# Patient Record
Sex: Male | Born: 1975
Health system: Southern US, Community
[De-identification: ages and names within clinical notes are randomized; demographics above are authoritative.]

## PROBLEM LIST (undated history)

## (undated) DIAGNOSIS — E785 Hyperlipidemia, unspecified: Secondary | ICD-10-CM

## (undated) DIAGNOSIS — R339 Retention of urine, unspecified: Secondary | ICD-10-CM

## (undated) DIAGNOSIS — J189 Pneumonia, unspecified organism: Secondary | ICD-10-CM

## (undated) DIAGNOSIS — F419 Anxiety disorder, unspecified: Secondary | ICD-10-CM

## (undated) DIAGNOSIS — R079 Chest pain, unspecified: Secondary | ICD-10-CM

## (undated) DIAGNOSIS — I509 Heart failure, unspecified: Secondary | ICD-10-CM

## (undated) DIAGNOSIS — I209 Angina pectoris, unspecified: Secondary | ICD-10-CM

## (undated) DIAGNOSIS — F329 Major depressive disorder, single episode, unspecified: Secondary | ICD-10-CM

## (undated) DIAGNOSIS — R112 Nausea with vomiting, unspecified: Secondary | ICD-10-CM

## (undated) DIAGNOSIS — N529 Male erectile dysfunction, unspecified: Secondary | ICD-10-CM

## (undated) DIAGNOSIS — D649 Anemia, unspecified: Secondary | ICD-10-CM

## (undated) DIAGNOSIS — N049 Nephrotic syndrome with unspecified morphologic changes: Secondary | ICD-10-CM

## (undated) DIAGNOSIS — K219 Gastro-esophageal reflux disease without esophagitis: Secondary | ICD-10-CM

## (undated) DIAGNOSIS — E11621 Type 2 diabetes mellitus with foot ulcer: Secondary | ICD-10-CM

## (undated) DIAGNOSIS — I2699 Other pulmonary embolism without acute cor pulmonale: Secondary | ICD-10-CM

## (undated) DIAGNOSIS — N12 Tubulo-interstitial nephritis, not specified as acute or chronic: Secondary | ICD-10-CM

## (undated) DIAGNOSIS — Z9289 Personal history of other medical treatment: Secondary | ICD-10-CM

## (undated) DIAGNOSIS — R609 Edema, unspecified: Secondary | ICD-10-CM

## (undated) DIAGNOSIS — Z449 Encounter for fitting and adjustment of unspecified external prosthetic device: Secondary | ICD-10-CM

## (undated) DIAGNOSIS — N183 Chronic kidney disease, stage 3 (moderate): Secondary | ICD-10-CM

## (undated) DIAGNOSIS — E109 Type 1 diabetes mellitus without complications: Secondary | ICD-10-CM

## (undated) DIAGNOSIS — G4733 Obstructive sleep apnea (adult) (pediatric): Secondary | ICD-10-CM

## (undated) DIAGNOSIS — E669 Obesity, unspecified: Secondary | ICD-10-CM

## (undated) DIAGNOSIS — R600 Localized edema: Secondary | ICD-10-CM

## (undated) DIAGNOSIS — K635 Polyp of colon: Secondary | ICD-10-CM

## (undated) DIAGNOSIS — F32A Depression, unspecified: Secondary | ICD-10-CM

## (undated) DIAGNOSIS — I10 Essential (primary) hypertension: Secondary | ICD-10-CM

## (undated) DIAGNOSIS — I824Y9 Acute embolism and thrombosis of unspecified deep veins of unspecified proximal lower extremity: Secondary | ICD-10-CM

## (undated) DIAGNOSIS — L97519 Non-pressure chronic ulcer of other part of right foot with unspecified severity: Secondary | ICD-10-CM

## (undated) DIAGNOSIS — I82409 Acute embolism and thrombosis of unspecified deep veins of unspecified lower extremity: Secondary | ICD-10-CM

## (undated) HISTORY — DX: Anemia, unspecified: D64.9

## (undated) HISTORY — DX: Obstructive sleep apnea (adult) (pediatric): G47.33

## (undated) HISTORY — DX: Acute embolism and thrombosis of unspecified deep veins of unspecified proximal lower extremity: I82.4Y9

## (undated) HISTORY — PX: INCISION AND DRAINAGE ABSCESS: SHX5864

## (undated) HISTORY — DX: Polyp of colon: K63.5

---
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
## 1999-01-05 ENCOUNTER — Encounter: Payer: Self-pay | Admitting: Emergency Medicine

## 1999-01-05 ENCOUNTER — Emergency Department (HOSPITAL_COMMUNITY): Admission: EM | Admit: 1999-01-05 | Discharge: 1999-01-05 | Payer: Self-pay | Admitting: Emergency Medicine

## 1999-01-15 ENCOUNTER — Emergency Department (HOSPITAL_COMMUNITY): Admission: EM | Admit: 1999-01-15 | Discharge: 1999-01-15 | Payer: Self-pay | Admitting: Emergency Medicine

## 1999-12-20 ENCOUNTER — Inpatient Hospital Stay (HOSPITAL_COMMUNITY): Admission: EM | Admit: 1999-12-20 | Discharge: 1999-12-27 | Payer: Self-pay | Admitting: Emergency Medicine

## 2002-01-09 DIAGNOSIS — Z794 Long term (current) use of insulin: Secondary | ICD-10-CM

## 2002-01-09 DIAGNOSIS — E119 Type 2 diabetes mellitus without complications: Secondary | ICD-10-CM | POA: Insufficient documentation

## 2002-09-10 DIAGNOSIS — I82409 Acute embolism and thrombosis of unspecified deep veins of unspecified lower extremity: Secondary | ICD-10-CM

## 2002-09-10 DIAGNOSIS — I2699 Other pulmonary embolism without acute cor pulmonale: Secondary | ICD-10-CM

## 2002-09-10 HISTORY — DX: Acute embolism and thrombosis of unspecified deep veins of unspecified lower extremity: I82.409

## 2002-09-10 HISTORY — DX: Other pulmonary embolism without acute cor pulmonale: I26.99

## 2002-09-23 ENCOUNTER — Encounter: Payer: Self-pay | Admitting: Internal Medicine

## 2002-09-23 ENCOUNTER — Inpatient Hospital Stay (HOSPITAL_COMMUNITY): Admission: EM | Admit: 2002-09-23 | Discharge: 2002-09-27 | Payer: Self-pay

## 2002-09-24 ENCOUNTER — Encounter: Payer: Self-pay | Admitting: Internal Medicine

## 2002-10-01 ENCOUNTER — Encounter: Payer: Self-pay | Admitting: Emergency Medicine

## 2002-10-01 ENCOUNTER — Inpatient Hospital Stay (HOSPITAL_COMMUNITY): Admission: EM | Admit: 2002-10-01 | Discharge: 2002-10-07 | Payer: Self-pay | Admitting: Emergency Medicine

## 2002-10-02 ENCOUNTER — Encounter: Payer: Self-pay | Admitting: Cardiology

## 2002-10-02 ENCOUNTER — Encounter: Payer: Self-pay | Admitting: Internal Medicine

## 2002-10-04 ENCOUNTER — Encounter: Payer: Self-pay | Admitting: Internal Medicine

## 2002-10-09 ENCOUNTER — Encounter: Admission: RE | Admit: 2002-10-09 | Discharge: 2002-10-09 | Payer: Self-pay | Admitting: Internal Medicine

## 2002-10-13 ENCOUNTER — Encounter: Admission: RE | Admit: 2002-10-13 | Discharge: 2002-10-13 | Payer: Self-pay | Admitting: Internal Medicine

## 2002-10-16 ENCOUNTER — Encounter: Admission: RE | Admit: 2002-10-16 | Discharge: 2002-10-16 | Payer: Self-pay | Admitting: Internal Medicine

## 2002-10-20 ENCOUNTER — Encounter: Admission: RE | Admit: 2002-10-20 | Discharge: 2002-10-20 | Payer: Self-pay | Admitting: Internal Medicine

## 2002-10-27 ENCOUNTER — Encounter: Admission: RE | Admit: 2002-10-27 | Discharge: 2002-10-27 | Payer: Self-pay | Admitting: Internal Medicine

## 2002-11-06 ENCOUNTER — Encounter: Admission: RE | Admit: 2002-11-06 | Discharge: 2002-11-06 | Payer: Self-pay | Admitting: Internal Medicine

## 2002-11-17 ENCOUNTER — Encounter: Admission: RE | Admit: 2002-11-17 | Discharge: 2002-11-17 | Payer: Self-pay | Admitting: Internal Medicine

## 2002-11-27 ENCOUNTER — Encounter: Admission: RE | Admit: 2002-11-27 | Discharge: 2002-11-27 | Payer: Self-pay | Admitting: Internal Medicine

## 2002-11-27 ENCOUNTER — Encounter (INDEPENDENT_AMBULATORY_CARE_PROVIDER_SITE_OTHER): Payer: Self-pay | Admitting: *Deleted

## 2002-12-08 ENCOUNTER — Encounter: Admission: RE | Admit: 2002-12-08 | Discharge: 2002-12-08 | Payer: Self-pay | Admitting: Internal Medicine

## 2003-01-26 ENCOUNTER — Encounter: Admission: RE | Admit: 2003-01-26 | Discharge: 2003-01-26 | Payer: Self-pay | Admitting: Internal Medicine

## 2003-02-16 ENCOUNTER — Encounter: Admission: RE | Admit: 2003-02-16 | Discharge: 2003-02-16 | Payer: Self-pay | Admitting: Internal Medicine

## 2003-03-16 ENCOUNTER — Encounter: Admission: RE | Admit: 2003-03-16 | Discharge: 2003-03-16 | Payer: Self-pay | Admitting: Internal Medicine

## 2003-04-02 ENCOUNTER — Encounter: Admission: RE | Admit: 2003-04-02 | Discharge: 2003-04-02 | Payer: Self-pay | Admitting: Internal Medicine

## 2003-05-27 ENCOUNTER — Emergency Department (HOSPITAL_COMMUNITY): Admission: EM | Admit: 2003-05-27 | Discharge: 2003-05-27 | Payer: Self-pay | Admitting: Emergency Medicine

## 2003-05-31 ENCOUNTER — Emergency Department (HOSPITAL_COMMUNITY): Admission: EM | Admit: 2003-05-31 | Discharge: 2003-05-31 | Payer: Self-pay | Admitting: Emergency Medicine

## 2003-06-03 ENCOUNTER — Encounter: Admission: RE | Admit: 2003-06-03 | Discharge: 2003-06-03 | Payer: Self-pay | Admitting: Internal Medicine

## 2003-06-03 ENCOUNTER — Ambulatory Visit (HOSPITAL_COMMUNITY): Admission: RE | Admit: 2003-06-03 | Discharge: 2003-06-03 | Payer: Self-pay | Admitting: Internal Medicine

## 2003-06-27 ENCOUNTER — Encounter: Admission: RE | Admit: 2003-06-27 | Discharge: 2003-06-27 | Payer: Self-pay | Admitting: Family Medicine

## 2003-09-28 ENCOUNTER — Ambulatory Visit: Payer: Self-pay | Admitting: Internal Medicine

## 2003-09-28 ENCOUNTER — Emergency Department (HOSPITAL_COMMUNITY): Admission: EM | Admit: 2003-09-28 | Discharge: 2003-09-28 | Payer: Self-pay | Admitting: Family Medicine

## 2003-10-09 ENCOUNTER — Inpatient Hospital Stay (HOSPITAL_COMMUNITY): Admission: EM | Admit: 2003-10-09 | Discharge: 2003-10-13 | Payer: Self-pay | Admitting: Emergency Medicine

## 2003-10-09 ENCOUNTER — Ambulatory Visit: Payer: Self-pay | Admitting: Internal Medicine

## 2004-02-28 ENCOUNTER — Inpatient Hospital Stay (HOSPITAL_COMMUNITY): Admission: EM | Admit: 2004-02-28 | Discharge: 2004-02-29 | Payer: Self-pay | Admitting: Emergency Medicine

## 2004-02-28 ENCOUNTER — Ambulatory Visit: Payer: Self-pay | Admitting: Internal Medicine

## 2004-06-16 ENCOUNTER — Inpatient Hospital Stay (HOSPITAL_COMMUNITY): Admission: AD | Admit: 2004-06-16 | Discharge: 2004-06-20 | Payer: Self-pay | Admitting: Emergency Medicine

## 2004-06-16 ENCOUNTER — Ambulatory Visit: Payer: Self-pay | Admitting: Internal Medicine

## 2004-07-07 ENCOUNTER — Ambulatory Visit (HOSPITAL_COMMUNITY): Admission: RE | Admit: 2004-07-07 | Discharge: 2004-07-07 | Payer: Self-pay | Admitting: Internal Medicine

## 2004-07-07 ENCOUNTER — Ambulatory Visit: Payer: Self-pay | Admitting: Internal Medicine

## 2004-07-08 ENCOUNTER — Ambulatory Visit: Payer: Self-pay | Admitting: Internal Medicine

## 2004-07-18 ENCOUNTER — Ambulatory Visit: Payer: Self-pay | Admitting: Internal Medicine

## 2004-08-01 ENCOUNTER — Ambulatory Visit: Payer: Self-pay | Admitting: Internal Medicine

## 2004-08-08 ENCOUNTER — Ambulatory Visit: Payer: Self-pay | Admitting: Internal Medicine

## 2004-12-29 ENCOUNTER — Ambulatory Visit: Payer: Self-pay | Admitting: Internal Medicine

## 2004-12-30 ENCOUNTER — Ambulatory Visit: Payer: Self-pay | Admitting: Internal Medicine

## 2005-03-16 ENCOUNTER — Emergency Department (HOSPITAL_COMMUNITY): Admission: EM | Admit: 2005-03-16 | Discharge: 2005-03-17 | Payer: Self-pay | Admitting: Emergency Medicine

## 2005-09-04 ENCOUNTER — Encounter (INDEPENDENT_AMBULATORY_CARE_PROVIDER_SITE_OTHER): Payer: Self-pay | Admitting: *Deleted

## 2005-09-04 ENCOUNTER — Ambulatory Visit: Payer: Self-pay | Admitting: Hospitalist

## 2005-09-19 ENCOUNTER — Inpatient Hospital Stay (HOSPITAL_COMMUNITY): Admission: EM | Admit: 2005-09-19 | Discharge: 2005-09-21 | Payer: Self-pay | Admitting: Emergency Medicine

## 2005-09-19 ENCOUNTER — Ambulatory Visit: Payer: Self-pay | Admitting: Internal Medicine

## 2005-10-26 ENCOUNTER — Encounter (INDEPENDENT_AMBULATORY_CARE_PROVIDER_SITE_OTHER): Payer: Self-pay | Admitting: *Deleted

## 2005-10-26 DIAGNOSIS — R110 Nausea: Secondary | ICD-10-CM | POA: Insufficient documentation

## 2006-03-21 ENCOUNTER — Ambulatory Visit: Payer: Self-pay | Admitting: *Deleted

## 2006-03-21 ENCOUNTER — Encounter: Payer: Self-pay | Admitting: *Deleted

## 2006-03-21 ENCOUNTER — Encounter (INDEPENDENT_AMBULATORY_CARE_PROVIDER_SITE_OTHER): Payer: Self-pay | Admitting: *Deleted

## 2006-03-21 ENCOUNTER — Ambulatory Visit (HOSPITAL_COMMUNITY): Admission: RE | Admit: 2006-03-21 | Discharge: 2006-03-21 | Payer: Self-pay | Admitting: *Deleted

## 2006-03-25 ENCOUNTER — Emergency Department (HOSPITAL_COMMUNITY): Admission: EM | Admit: 2006-03-25 | Discharge: 2006-03-25 | Payer: Self-pay | Admitting: Emergency Medicine

## 2006-03-26 ENCOUNTER — Telehealth: Payer: Self-pay | Admitting: *Deleted

## 2006-04-03 ENCOUNTER — Telehealth: Payer: Self-pay | Admitting: *Deleted

## 2006-12-03 ENCOUNTER — Telehealth: Payer: Self-pay | Admitting: *Deleted

## 2007-01-29 ENCOUNTER — Ambulatory Visit: Payer: Self-pay | Admitting: Internal Medicine

## 2007-01-29 ENCOUNTER — Encounter (INDEPENDENT_AMBULATORY_CARE_PROVIDER_SITE_OTHER): Payer: Self-pay | Admitting: *Deleted

## 2007-02-01 DIAGNOSIS — R809 Proteinuria, unspecified: Secondary | ICD-10-CM | POA: Insufficient documentation

## 2007-02-01 LAB — CONVERTED CEMR LAB
Creatinine, Ser: 0.95 mg/dL (ref 0.40–1.50)
Glucose, Bld: 448 mg/dL — ABNORMAL HIGH (ref 70–99)
Microalb Creat Ratio: 49.5 mg/g — ABNORMAL HIGH (ref 0.0–30.0)
Microalb, Ur: 2.16 mg/dL — ABNORMAL HIGH (ref 0.00–1.89)
Potassium: 4.2 meq/L (ref 3.5–5.3)
Sodium: 130 meq/L — ABNORMAL LOW (ref 135–145)

## 2007-03-07 ENCOUNTER — Telehealth: Payer: Self-pay | Admitting: *Deleted

## 2007-05-08 ENCOUNTER — Ambulatory Visit: Payer: Self-pay | Admitting: Internal Medicine

## 2007-05-08 LAB — CONVERTED CEMR LAB
Blood Glucose, Fingerstick: 249
Hgb A1c MFr Bld: 10.1 %

## 2007-06-21 ENCOUNTER — Ambulatory Visit: Payer: Self-pay | Admitting: Internal Medicine

## 2007-06-21 DIAGNOSIS — B86 Scabies: Secondary | ICD-10-CM | POA: Insufficient documentation

## 2007-08-05 ENCOUNTER — Emergency Department (HOSPITAL_COMMUNITY): Admission: EM | Admit: 2007-08-05 | Discharge: 2007-08-06 | Payer: Self-pay | Admitting: Emergency Medicine

## 2008-01-07 ENCOUNTER — Telehealth: Payer: Self-pay | Admitting: Infectious Diseases

## 2008-01-08 ENCOUNTER — Telehealth: Payer: Self-pay | Admitting: *Deleted

## 2008-12-28 ENCOUNTER — Ambulatory Visit: Payer: Self-pay | Admitting: Internal Medicine

## 2008-12-28 LAB — CONVERTED CEMR LAB: Blood Glucose, Fingerstick: 180

## 2008-12-29 LAB — CONVERTED CEMR LAB
Alkaline Phosphatase: 67 units/L (ref 39–117)
BUN: 14 mg/dL (ref 6–23)
Calcium: 10.1 mg/dL (ref 8.4–10.5)
Chloride: 103 meq/L (ref 96–112)
Cholesterol: 154 mg/dL (ref 0–200)
Creatinine, Ser: 0.89 mg/dL (ref 0.40–1.50)
HDL: 43 mg/dL (ref >39)
Hemoglobin: 16.3 g/dL (ref 13.0–17.0)
MCHC: 35.7 g/dL (ref 30.0–36.0)
Microalb Creat Ratio: 80.5 mg/g — ABNORMAL HIGH (ref 0.0–30.0)
Platelets: 289 10*3/uL (ref 150–400)
Potassium: 4.4 meq/L (ref 3.5–5.3)
RBC: 5.54 M/uL (ref 4.22–5.81)
RDW: 12.6 % (ref 11.5–15.5)
Sodium: 140 meq/L (ref 135–145)
TSH: 1.964 microintl units/mL (ref 0.350–4.5)
Total Bilirubin: 1 mg/dL (ref 0.3–1.2)

## 2009-01-14 ENCOUNTER — Ambulatory Visit: Payer: Self-pay | Admitting: Internal Medicine

## 2009-01-14 DIAGNOSIS — R609 Edema, unspecified: Secondary | ICD-10-CM | POA: Insufficient documentation

## 2009-01-14 LAB — CONVERTED CEMR LAB
ALT: 18 units/L (ref 0–53)
AST: 20 units/L (ref 0–37)
Bilirubin Urine: NEGATIVE
Bilirubin Urine: NEGATIVE
Blood Glucose, Fingerstick: 119
Blood Glucose, Home Monitor: 4 mg/dL
Calcium: 9.1 mg/dL (ref 8.4–10.5)
Glucose, Bld: 76 mg/dL (ref 70–99)
Glucose, Urine, Semiquant: NEGATIVE
Hemoglobin, Urine: NEGATIVE
Ketones, ur: NEGATIVE mg/dL
Leukocytes, UA: NEGATIVE
Nitrite: NEGATIVE
Protein, U semiquant: NEGATIVE
Sodium: 137 meq/L (ref 135–145)
Specific Gravity, Urine: 1.014 (ref 1.005–1.0)
Total Protein: 6.8 g/dL (ref 6.0–8.3)
Urine Glucose: NEGATIVE mg/dL
Urobilinogen, UA: 0.2
WBC Urine, dipstick: NEGATIVE
pH: 5

## 2009-01-14 LAB — HM DIABETES FOOT EXAM

## 2009-04-05 ENCOUNTER — Emergency Department (HOSPITAL_COMMUNITY): Admission: EM | Admit: 2009-04-05 | Discharge: 2009-04-05 | Payer: Self-pay | Admitting: Emergency Medicine

## 2009-04-22 ENCOUNTER — Telehealth: Payer: Self-pay | Admitting: *Deleted

## 2009-05-18 ENCOUNTER — Telehealth: Payer: Self-pay | Admitting: *Deleted

## 2009-05-18 ENCOUNTER — Emergency Department (HOSPITAL_COMMUNITY): Admission: EM | Admit: 2009-05-18 | Discharge: 2009-05-18 | Payer: Self-pay | Admitting: Family Medicine

## 2009-05-19 ENCOUNTER — Telehealth: Payer: Self-pay | Admitting: *Deleted

## 2009-05-21 ENCOUNTER — Ambulatory Visit: Payer: Self-pay | Admitting: Internal Medicine

## 2009-05-21 DIAGNOSIS — L03221 Cellulitis of neck: Secondary | ICD-10-CM

## 2009-05-21 DIAGNOSIS — L0211 Cutaneous abscess of neck: Secondary | ICD-10-CM | POA: Insufficient documentation

## 2009-05-24 ENCOUNTER — Ambulatory Visit: Payer: Self-pay | Admitting: Infectious Disease

## 2009-07-19 ENCOUNTER — Telehealth: Payer: Self-pay | Admitting: Internal Medicine

## 2009-07-21 ENCOUNTER — Ambulatory Visit: Payer: Self-pay | Admitting: Internal Medicine

## 2009-07-21 ENCOUNTER — Encounter: Payer: Self-pay | Admitting: Internal Medicine

## 2009-07-21 ENCOUNTER — Inpatient Hospital Stay (HOSPITAL_COMMUNITY): Admission: AD | Admit: 2009-07-21 | Discharge: 2009-07-22 | Payer: Self-pay | Admitting: Internal Medicine

## 2009-07-21 DIAGNOSIS — E1010 Type 1 diabetes mellitus with ketoacidosis without coma: Secondary | ICD-10-CM | POA: Insufficient documentation

## 2009-07-21 DIAGNOSIS — R109 Unspecified abdominal pain: Secondary | ICD-10-CM | POA: Insufficient documentation

## 2009-07-21 LAB — CONVERTED CEMR LAB
ALT: 16 units/L (ref 0–53)
AST: 20 units/L (ref 0–37)
Alkaline Phosphatase: 82 units/L (ref 39–117)
Basophils Absolute: 0 10*3/uL (ref 0.0–0.1)
Basophils Relative: 0 % (ref 0–1)
CO2: 21 meq/L (ref 19–32)
Glucose, Bld: 472 mg/dL — ABNORMAL HIGH (ref 70–99)
HCT: 46 % (ref 39.0–52.0)
Hemoglobin, Urine: NEGATIVE
Lipase: 57 units/L (ref 11–59)
Lymphocytes Relative: 20 % (ref 12–46)
Monocytes Absolute: 0.6 10*3/uL (ref 0.1–1.0)
Neutrophils Relative %: 70 % (ref 43–77)
Potassium: 4.8 meq/L (ref 3.5–5.3)
RBC: 5.23 M/uL (ref 4.22–5.81)
Total Bilirubin: 1.8 mg/dL — ABNORMAL HIGH (ref 0.3–1.2)
Total Protein: 7.8 g/dL (ref 6.0–8.3)
WBC: 7.5 10*3/uL (ref 4.0–10.5)
pH: 5.5 (ref 5.0–8.0)

## 2009-07-22 ENCOUNTER — Encounter: Payer: Self-pay | Admitting: Internal Medicine

## 2009-07-28 ENCOUNTER — Encounter: Payer: Self-pay | Admitting: Internal Medicine

## 2009-07-28 ENCOUNTER — Ambulatory Visit: Payer: Self-pay | Admitting: Vascular Surgery

## 2009-07-28 ENCOUNTER — Ambulatory Visit: Payer: Self-pay | Admitting: Internal Medicine

## 2009-07-28 ENCOUNTER — Ambulatory Visit: Admission: RE | Admit: 2009-07-28 | Discharge: 2009-07-28 | Payer: Self-pay | Admitting: Internal Medicine

## 2009-07-28 ENCOUNTER — Inpatient Hospital Stay (HOSPITAL_COMMUNITY): Admission: EM | Admit: 2009-07-28 | Discharge: 2009-07-29 | Payer: Self-pay | Admitting: Internal Medicine

## 2009-07-28 DIAGNOSIS — Z86718 Personal history of other venous thrombosis and embolism: Secondary | ICD-10-CM | POA: Insufficient documentation

## 2009-07-29 ENCOUNTER — Encounter: Payer: Self-pay | Admitting: Internal Medicine

## 2009-08-02 ENCOUNTER — Ambulatory Visit: Payer: Self-pay | Admitting: Internal Medicine

## 2009-08-02 DIAGNOSIS — E876 Hypokalemia: Secondary | ICD-10-CM | POA: Insufficient documentation

## 2009-08-02 DIAGNOSIS — Z86718 Personal history of other venous thrombosis and embolism: Secondary | ICD-10-CM | POA: Insufficient documentation

## 2009-08-02 LAB — CONVERTED CEMR LAB
BUN: 8 mg/dL (ref 6–23)
Creatinine, Ser: 0.83 mg/dL (ref 0.40–1.50)
Glucose, Bld: 303 mg/dL — ABNORMAL HIGH (ref 70–99)
INR: 1.4
Potassium: 4.2 meq/L (ref 3.5–5.3)

## 2009-08-09 ENCOUNTER — Ambulatory Visit: Payer: Self-pay | Admitting: Internal Medicine

## 2009-08-09 LAB — CONVERTED CEMR LAB

## 2009-08-31 ENCOUNTER — Telehealth: Payer: Self-pay | Admitting: Internal Medicine

## 2009-09-01 ENCOUNTER — Ambulatory Visit: Payer: Self-pay | Admitting: Internal Medicine

## 2009-09-01 LAB — CONVERTED CEMR LAB: INR: 1.1

## 2010-01-25 DIAGNOSIS — Z7901 Long term (current) use of anticoagulants: Secondary | ICD-10-CM

## 2010-01-25 DIAGNOSIS — I82409 Acute embolism and thrombosis of unspecified deep veins of unspecified lower extremity: Secondary | ICD-10-CM

## 2010-01-25 DIAGNOSIS — I2699 Other pulmonary embolism without acute cor pulmonale: Secondary | ICD-10-CM

## 2010-02-06 LAB — CONVERTED CEMR LAB
Blood Glucose, Fingerstick: 116
CO2: 27 meq/L (ref 19–32)
Chloride: 103 meq/L (ref 96–112)
Creatinine, Ser: 0.81 mg/dL (ref 0.40–1.50)
Sodium: 141 meq/L (ref 135–145)

## 2010-02-08 NOTE — Assessment & Plan Note (Signed)
Summary: coumadin check/pcp-joines/hla  Anticoagulant Therapy Managed by: [name]. Ceasar Lund  PharmD CACP PCP: Bertha Stakes MD [facility] Attending: Lynnae January MD, Benjamine Mola Indication 1: Deep vein thrombus Indication 2: Encounter for therapeutic drug monitoring  V58.83 Start date: 07/28/2009  Patient Assessment Reviewed by: Paulla Dolly PharmD  September 01, 2009 Medication review: verified warfarin dosage & schedule,verified previous prescription medications, verified doses & any changes, verified new medications, reviewed OTC medications, reviewed OTC health products-vitamins supplements etc Complications: none Dietary changes: none   Health status changes: none   Lifestyle changes: none   Recent/future hospitalizations: none   Recent/future procedures: none   Recent/future dental: none Patient Assessment Part 2:  Have you MISSED ANY DOSES or CHANGED TABLETS?  YES. States he missed the past 3 days/doses of warfarin (ran out he states). Was supposed to come to clinic on 10-Aug-11 but did not keep appointment.  Have you had any BRUISING or BLEEDING ( nose or gum bleeds,blood in urine or stool)?  No reported bruising or bleeding in nose, gums, urine, stool.  Have you STARTED or STOPPED any MEDICATIONS, including OTC meds,herbals or supplements?  No other medications or herbal supplements were started or stopped.  Have you CHANGED your DIET, especially green vegetables,or ALCOHOL intake?  No changes in diet or alcohol intake.  Have you had any ILLNESSES or HOSPITALIZATIONS?  No reported illnesses or hospitalizations  Have you had any signs of CLOTTING?(chest discomfort,dizziness,shortness of breath,arms tingling,slurred speech,swelling or redness in leg)    No chest discomfort, dizziness, shortness of breath, tingling in arm, slurred speech, swelling, or redness in leg.     Treatment  Target INR: 2.0-3.0 INR: 1.1  Date: 09/01/2009 Regimen In:  27.5mg /week INR reflects regimen in:  1.1  New  Tablet strength: : 5mg  Regimen Out:     Sunday: 1 Tablet     Monday: 1 Tablet     Tuesday: 1 Tablet     Wednesday: 1/2 Tablet     Thursday: 1 Tablet      Friday: 1 Tablet     Saturday: 1 Tablet Total Weekly: 32.5mg/week mg  Next INR Due: 09/20/2009 Adjusted by: James B. Groce III PharmD CACP   Return to anticoagulation clinic:  09/20/2009 Time of next visit: 1515   Comments: Non-compliant to last scheduled clinic appointment.   Allergies: No Known Drug Allergies Prescriptions: WARFARIN SODIUM 5 MG TABS (WARFARIN SODIUM) Take as directed.  #31 x 2   Entered by:   Jay Groce PharmD   Authorized by:   [name] Butcher MD   Signed by:   Jay Groce PharmD on 09/01/2009   Method used:   Electronically to        [id number] (retail)       [address], [place]  [address]       Ph: [phone]       Fax: [phone]   RxID:   [id number]

## 2010-02-08 NOTE — Assessment & Plan Note (Signed)
Summary: HFU-PER DR WATSON/CFB(JOINES)   Vital Signs:  Patient profile:   35 year old male Height:      71 inches (180.34 cm) Weight:      323.9 pounds (147.23 kg) BMI:     45.34 Temp:     98.2 degrees F Pulse rate:   85 / minute BP sitting:   138 / 87  (left arm) Cuff size:   large  Vitals Entered By: Yvonna Alanis RN (August 02, 2009 9:38 AM)  CC: HFU - low potassium and INR control - stat BMET and PT/INR drawn - pt needs f/u appt with Barnabas Harries, Marlana Latus - states bilateral legs less swollen than when admitted to hospital - has some residual leg soreness left leg Is Patient Diabetic? Yes Did you bring your meter with you today? No Pain Assessment Patient in pain? yes     Location: left leg - esp left ankle Intensity: 1 Type: aching Onset of pain  last week Nutritional Status BMI of > 30 = obese  Does patient need assistance? Functional Status Self care Ambulation Normal Comments states wants diuretic to improve urination and decrease swelling in legs   Primary Care Provider:  Bertha Stakes MD  CC:  HFU - low potassium and INR control - stat BMET and PT/INR drawn - pt needs f/u appt with Barnabas Harries and Marlana Latus - states bilateral legs less swollen than when admitted to hospital - has some residual leg soreness left leg.  History of Present Illness: 35 y/o male with pmh as described in the EMR; who comes tot he clinic for HFU, after been admitted secondary to Left LE DVT. Patient is currently using lovenox and coumadin; he is not having any leg pain, palpitations, SOB or chest pain.  INR today was 1.4; patient will follow with Dr. Elie Confer Monday next week.  Preventive Screening-Counseling & Management  Alcohol-Tobacco     Alcohol type: AT TIMES     Smoking Status: never  Caffeine-Diet-Exercise     Does Patient Exercise: yes     Type of exercise: Treadmill, bike, free weights.     Times/week: 4-5  Problems Prior to Update: 1)  Deep Venous Thrombophlebitis, Hx of   (ICD-V12.52) 2)  Special Scr Examination Unspec Viral Disease  (ICD-V73.99) 3)  Diabetes W/ketoacidosis Type I [juv] Uncntrl  (ICD-250.13) 4)  Abdominal Pain  (ICD-789.00) 5)  Abscess, Neck  (ICD-682.1) 6)  Edema  (ICD-782.3) 7)  Scabies  (ICD-133.0) 8)  Proteinuria, Mild  (ICD-791.0) 9)  Sexual Activity, High Risk  (ICD-V69.2) 10)  Morbid Obesity  (ICD-278.01) 11)  Nausea  (ICD-787.02) 12)  Diabetes Mellitus, Type I  (ICD-250.01)  Current Problems (verified): 1)  Hypokalemia  (ICD-276.8) 2)  Deep Venous Thrombophlebitis, Hx of  (ICD-V12.52) 3)  Special Scr Examination Unspec Viral Disease  (ICD-V73.99) 4)  Diabetes W/ketoacidosis Type I [juv] Uncntrl  (ICD-250.13) 5)  Abdominal Pain  (ICD-789.00) 6)  Abscess, Neck  (ICD-682.1) 7)  Edema  (ICD-782.3) 8)  Scabies  (ICD-133.0) 9)  Proteinuria, Mild  (ICD-791.0) 10)  Sexual Activity, High Risk  (ICD-V69.2) 11)  Morbid Obesity  (ICD-278.01) 12)  Nausea  (ICD-787.02) 13)  Diabetes Mellitus, Type I  (ICD-250.01)  Medications Prior to Update: 1)  Novolin 70/30 70-30 %  Susp (Insulin Isophane & Regular) .... Inject 45 Units Subcutaneously Each Am and 40 Units Subcutaneously Each Pm. 2)  Glucophage 500 Mg Tabs (Metformin Hcl) .... Take 2 Tablets By Mouth Two Times A Day 3)  Truetrack Test  Strp (Glucose Blood) .... Use To Test Blood Sugar 4-6 Times A Day- Suggest At Least Before Each Meal and Bedtime 4)  Lancets 30g  Misc (Lancets) .... Use To Check Blood Sugar 4-6 Times Daily 5)  Enoxaparin Sodium 150 Mg/ml Soln (Enoxaparin Sodium) .... Inject 150mg  Subcutaneously Two Times A Day For 5 Days. 6)  Warfarin Sodium 7.5 Mg Tabs (Warfarin Sodium) .... Take 1 Tablet By Mouth Once A Day  Current Medications (verified): 1)  Novolin 70/30 70-30 %  Susp (Insulin Isophane & Regular) .... Inject 45 Units Subcutaneously Each Am and 40 Units Subcutaneously Each Pm. 2)  Glucophage 500 Mg Tabs (Metformin Hcl) .... Take 2 Tablets By Mouth Two Times A  Day 3)  Truetrack Test  Strp (Glucose Blood) .... Use To Test Blood Sugar 4-6 Times A Day- Suggest At Least Before Each Meal and Bedtime 4)  Lancets 30g  Misc (Lancets) .... Use To Check Blood Sugar 4-6 Times Daily 5)  Enoxaparin Sodium 150 Mg/ml Soln (Enoxaparin Sodium) .... Inject 150mg  Subcutaneously Two Times A Day For 5 Days. 6)  Warfarin Sodium 7.5 Mg Tabs (Warfarin Sodium) .... Take 1 Tablet By Mouth Once A Day  Allergies (verified): No Known Drug Allergies  Past History:  Family History: Last updated: 05/08/2007 AODM in mother CHF aunt HTN several relatives No cancer  Social History: Last updated: 07/21/2009 Finishing school at [employer] this month, plans to transfer to 4 year school. 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  Risk Factors: Exercise: yes (08/02/2009)  Risk Factors: Smoking Status: never (08/02/2009)  Past Medical History: Diabetes mellitus, type I DVT, hx of  Review of Systems  The patient denies anorexia, fever, chest pain, syncope, dyspnea on exertion, abdominal pain, melena, hematochezia, severe indigestion/heartburn, and muscle weakness.    Physical Exam  General:  alert, well-developed, well-hydrated, and overweight-appearing.   Lungs:  normal respiratory effort, normal breath sounds, no crackles, and no wheezes.   Heart:  normal rate, regular rhythm, no murmur, no gallop, and no rub.   Abdomen:  soft, non-tender, normal bowel sounds, no guarding, no rigidity, no hepatomegaly, and no splenomegaly.   Extremities:  Trace edema on right leg; 1+ edema on his left leg; wearing a stocking sock to help with swelling. Neurologic:  alert & oriented X3, cranial nerves II-XII intact, and gait normal.     Impression & Recommendations:  Problem # 1:  DVT, HX OF (ICD-V12.51)  Patient with recent DVT in his left  lower extremety; currently using lovenox and coumadin for bridging. INR 1.4; will continue coadjuvant  anticogulation therapy and will bring him on Friday for further INR analysis. Patient has had 2 previous unprovoked episodes ofDVT which means he will need coumadin therapy for the rest of his life.  His updated medication list for this problem includes:    Enoxaparin Sodium 150 Mg/ml Soln (Enoxaparin sodium) ..... Inject 150mg  subcutaneously two times a day for 5 days.    Warfarin Sodium 7.5 Mg Tabs (Warfarin sodium) .Marland Kitchen... Take 1 tablet by mouth once a day  Future Orders: T-Protime (in-house) [id number]) ... 08/06/2009  Problem # 2:  HYPOKALEMIA (ICD-276.8) BMET done and his electrolytes and renal function are WNL.  Orders: T-Basic Metabolic Panel ([id number])  Problem # 3:  DIABETES MELLITUS, TYPE I (ICD-250.01) CBG 300 and his A1C 11.3; he has not been compliant with his insulin for the last couple of months; but is now back on track and  planning to be compliant with his meds; will repeat A1C in 3 months and at that time adjust his medications as needed. Patient advised tofollow a low carn diet and tocontinue exercising and losing weight.  His updated medication list for this problem includes:    Novolin 70/30 70-30 % Susp (Insulin isophane & regular) ..... Inject 45 units subcutaneously each am and 40 units subcutaneously each pm.    Glucophage 500 Mg Tabs (Metformin hcl) .Marland Kitchen... Take 2 tablets by mouth two times a day  Labs Reviewed: Creat: 0.81 (07/28/2009)    Reviewed HgBA1c results: 12.2 (07/21/2009)  11.3 (05/21/2009)  Complete Medication List: 1)  Novolin 70/30 70-30 % Susp (Insulin isophane & regular) .... Inject 45 units subcutaneously each am and 40 units subcutaneously each pm. 2)  Glucophage 500 Mg Tabs (Metformin hcl) .... Take 2 tablets by mouth two times a day 3)  Truetrack Test Strp (Glucose blood) .... Use to test blood sugar 4-6 times a day- suggest at least before each meal and bedtime 4)  Lancets 30g Misc (Lancets) .... Use to check blood sugar 4-6 times daily 5)   Enoxaparin Sodium 150 Mg/ml Soln (Enoxaparin sodium) .... Inject 150mg  subcutaneously two times a day for 5 days. 6)  Warfarin Sodium 7.5 Mg Tabs (Warfarin sodium) .... Take 1 tablet by mouth once a day  Patient Instructions: 1)  Follow with your coumadin clinic appointments and be compliant with your coumadin. 2)  Return on Friday morning to repeat coumadin levels and adjust your anticoagulation therapy. 3)  You will be called with any abnormalities in the tests scheduled or performed today.  If you don't hear from [place] within a week from when the test was performed, you can assume that your test was normal. 4)  Please schedule a follow-up appointment in 3 months with PCP for DM and chronic problems followup. 5)  Make sure you follow with Bonna Gains and also with Barnabas Harries. Process Orders Check Orders Results:     [facility]: ABN not required for this insurance Tests Sent for requisitioning (August 03, 2009 5:25 PM):     08/02/2009: [facility] -- T-Basic Metabolic Panel [id number] (signed)    Prevention & Chronic Care Immunizations   Influenza vaccine: Fluvax Non-MCR  (12/28/2008)   Influenza vaccine due: 09/09/2009    Tetanus booster: Not documented   Td booster deferral: Deferred  (07/28/2009)    Pneumococcal vaccine: Not documented   Pneumococcal vaccine deferral: Deferred  (07/28/2009)  Other Screening   Smoking status: never  (08/02/2009)  Diabetes Mellitus   HgbA1C: 12.2  (07/21/2009)    Eye exam: Not documented    Foot exam: yes  (01/14/2009)   High risk foot: No  (05/08/2007)   Foot care education: Done  (05/08/2007)   Foot exam due: 01/14/2010    Urine microalbumin/creatinine ratio: 80.5  (12/28/2008)   Urine microalbumin action/deferral: Ordered  Self-Management Support :   Personal Goals (by the next clinic visit) :     Personal A1C goal: 7  (05/21/2009)     Personal blood pressure goal: 130/80  (05/21/2009)      Personal LDL goal: 100  (05/21/2009)    Patient will work on the following items until the next clinic visit to reach self-care goals:     Medications and monitoring: take my medicines every day, check my blood sugar, bring all of my medications to every visit, examine my feet every day  (08/02/2009)     Eating: drink  diet soda or water instead of juice or soda, eat more vegetables, use fresh or frozen vegetables, eat foods that are low in salt, eat baked foods instead of fried foods, eat fruit for snacks and desserts  (08/02/2009)     Activity: join a walking program  (08/02/2009)     Other: avoids foods high in Vit K - reduced beef intake - no added salt  (08/02/2009)     Home glucose monitoring frequency: 3 times a day  (07/28/2009)    Diabetes self-management support: Written self-care plan, Education handout, Resources for patients handout  (08/02/2009)   Diabetes care plan printed   Diabetes education handout printed   Last diabetes self-management training by diabetes educator: 01/14/2009      Resource handout printed.  Laboratory Results   Blood Tests   Date/Time Received: August 02, 2009 10:02 AM Date/Time Reported: Maryan Rued  August 02, 2009 10:02 AM    INR: 1.4   (Normal Range: 0.88-1.12   Therap INR: 2.0-3.5)

## 2010-02-08 NOTE — Miscellaneous (Signed)
Summary: INFORMED CONSENT FOR MEDICAL   INFORMED CONSENT FOR MEDICAL   Imported By: Garlan Fillers 06/03/2009 11:59:27  _____________________________________________________________________  External Attachment:    Type:   Image     Comment:   External Document

## 2010-02-08 NOTE — Assessment & Plan Note (Signed)
Summary: f/u on cyst/gg   Vital Signs:  Patient profile:   35 year old male Height:      73 inches (185.42 cm) Weight:      330.1 pounds (150.05 kg) BMI:     43.71 Temp:     98.1 degrees F (36.72 degrees C) oral Pulse rate:   97 / minute BP sitting:   128 / 86  (left arm) Cuff size:   large  Vitals Entered By: Lucky Rathke NT II (May 21, 2009 11:17 AM) CC: DM  / PATIENT STATES THAT HE IS HERE FOR FOLLOW UP OF CYST. Is Patient Diabetic? Yes Did you bring your meter with you today? No Pain Assessment Patient in pain? yes     Location:   CYST Intensity:      8 Nutritional Status BMI of > 30 = obese CBG Result 550  Have you ever been in a relationship where you felt threatened, hurt or afraid?No   Does patient need assistance? Functional Status Self care Ambulation Normal Comments PATIENT STATES THAT HE IS HERE FOR FOLLOW UP OF CYST   Primary Care Provider:  Bertha Stakes MD  CC:  DM  / PATIENT STATES THAT HE IS HERE FOR FOLLOW UP OF CYST.Marland Kitchen  History of Present Illness: 22 r old with PMH as mentioned in the EMR comes to the office for a urgent care follow up.  He was seen in the Urgent care on 5/10 for a cyst on the back of his neck and has underwent ?I&D and was started on Doxycycline for 10 days and Vicodin. He reports that the cyst increased in size since 5/10 and also reports associated pain and tenderness. He denies any fever, or other systemic complaints.   He reports checking his blood sugars three times a day and didnt bring his glucmeter. He reports that his blood sugars have been running in 400's in the last one week. He reports taking insulin twice daily and rare misses doses.  He denies any other complaints.      Preventive Screening-Counseling & Management  Alcohol-Tobacco     Alcohol type: AT TIMES     Smoking Status: never  Caffeine-Diet-Exercise     Does Patient Exercise: yes     Type of exercise: Treadmill, bike, free weights.     Times/week:  4-5  Problems Prior to Update: 1)  Abscess, Neck  (ICD-682.1) 2)  Edema  (ICD-782.3) 3)  Scabies  (ICD-133.0) 4)  Proteinuria, Mild  (ICD-791.0) 5)  Sexual Activity, High Risk  (ICD-V69.2) 6)  Morbid Obesity  (ICD-278.01) 7)  Nausea  (ICD-787.02) 8)  Diabetes Mellitus, Type I  (ICD-250.01)  Medications Prior to Update: 1)  Novolin 70/30 70-30 %  Susp (Insulin Isophane & Regular) .... Inject 45 Units Subcutaneously Each Am and 40 Units Subcutaneously Each Pm. 2)  Glucophage 500 Mg Tabs (Metformin Hcl) .... One Tablet Daily. 3)  Truetrack Test  Strp (Glucose Blood) .... Use To Test Blood Sugar 4-6 Times A Day- Suggest At Least Before Each Meal and Bedtime 4)  Lancets 30g  Misc (Lancets) .... Use To Check Blood Sugar 4-6 Times Daily 5)  Furosemide 40 Mg Tabs (Furosemide) .... Take One Tablet Two Times A Day For 3 Days.  Current Medications (verified): 1)  Novolin 70/30 70-30 %  Susp (Insulin Isophane & Regular) .... Inject 45 Units Subcutaneously Each Am and 40 Units Subcutaneously Each Pm. 2)  Glucophage 500 Mg Tabs (Metformin Hcl) .... One Tablet Daily. 3)  Truetrack Test  Strp (Glucose Blood) .... Use To Test Blood Sugar 4-6 Times A Day- Suggest At Least Before Each Meal and Bedtime 4)  Lancets 30g  Misc (Lancets) .... Use To Check Blood Sugar 4-6 Times Daily 5)  Doxycycline Hyclate 100 Mg Caps (Doxycycline Hyclate) .... Take 1 Tablet By Mouth Two Times A Day For 10 Days 6)  Hydrocodone-Acetaminophen 5-500 Mg Tabs (Hydrocodone-Acetaminophen) .... Take 1 Tablet By Mouth Three Times A Day As Needed  Allergies (verified): No Known Drug Allergies  Past History:  Family History: Last updated: 05/08/2007 AODM in mother CHF aunt HTN several relatives No cancer  Social History: Last updated: 05/21/2009 Finishing school at [employer] in Dec, plans to transfer to 4 year school. 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  Risk  Factors: Exercise: yes (05/21/2009)  Past Medical History: Reviewed history from 10/26/2005 and no changes required. Diabetes mellitus, type I  Family History: Reviewed history from 05/08/2007 and no changes required. AODM in mother CHF aunt HTN several relatives No cancer  Social History: Reviewed history from 12/28/2008 and no changes required. Finishing school at [employer] in Dec, plans to transfer to 4 year school. 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  Review of Systems      See HPI  Physical Exam  General:  alert and well-developed.   Neck:  there is lesion of about 2cm in diameter on the back of his neck, s/p needle aspiration, very tender to palpation, slightly warm, no signs of cellulitis. Lungs:  normal respiratory effort, no intercostal retractions, no accessory muscle use, and normal breath sounds.   Heart:  normal rate, regular rhythm, and no murmur.  normal rate, regular rhythm, and no murmur.  normal rate, regular rhythm, and no murmur.   Abdomen:  soft and non-tender.  soft and non-tender.  soft and non-tender.     Impression & Recommendations:  Problem # 1:  DIABETES MELLITUS, TYPE I (ICD-250.01) Uncontrolled although HbA1C is slightly improved from last time. Given no glucometer readings explained that its hard to adjust his insulin regimen. I really doubt his compliance to his regimen as well. Will increase his metformin to 1000 two times a day and continue him on the same amount on insulin. Will follow up on him next monday(for his abscess f/u).  His updated medication list for this problem includes:    Novolin 70/30 70-30 % Susp (Insulin isophane & regular) ..... Inject 45 units subcutaneously each am and 40 units subcutaneously each pm.    Glucophage 500 Mg Tabs (Metformin hcl) .Marland Kitchen... Take 2 tablets by mouth two times a day  Orders: T- Capillary Blood Glucose [id number]) T-Hgb A1C (in-house)  [id number])  Labs Reviewed: Creat: 0.85 (01/14/2009)    Reviewed HgBA1c results: 13.5 (12/28/2008)  10.1 (05/08/2007)  His updated medication list for this problem includes:    Novolin 70/30 70-30 % Susp (Insulin isophane & regular) ..... Inject 45 units subcutaneously each am and 40 units subcutaneously each pm.    Glucophage 500 Mg Tabs (Metformin hcl) ..... One tablet daily.  His updated medication list for this problem includes:    Novolin 70/30 70-30 % Susp (Insulin isophane & regular) ..... Inject 45 units subcutaneously each am and 40 units subcutaneously each pm.    Glucophage 500 Mg Tabs (Metformin hcl) ..... One tablet daily.  Problem # 2:  ABSCESS, NECK (ICD-682.1) Abscess on the back of  his neck. Discussed with Dr. Hilma Favors. After discussion with the patient and obtaining consent, decided to perform I&D. After Lidocaine local anesthesia, we performed a horizontal incision of about 1.5 cm and deepened the incision. The subcutaneous tissue was very hard and felt like scar tissue. There was minimal amount of pus during the whole procedure, which was sent for cultures. The whole abscess area was squeezed and opened up with a forceps and evacuated. Patient tolerated the procedure well. His persistentdespite ondoxy, decided to change his abx to bactrim for 14 days. will start him on percocet temporarily for now, given that vicodin hasnt been working for him. will follow up next monday.  His updated medication list for this problem includes:    Bactrim Ds 800-160 Mg Tabs (Sulfamethoxazole-trimethoprim) .Marland Kitchen... Take 1 tablet by mouth two times a day for 14  Orders: I&D Abscess, Complex ([id number]) T-Culture, Wound ([id number])  Supervised Dr. Eyvonne Mechanic in complex I&D of posterior neck abscess. Wound packed and patient will F/U on Monday AM.  Complete Medication List: 1)  Novolin 70/30 70-30 % Susp (Insulin isophane & regular) .... Inject 45 units subcutaneously each am and 40 units  subcutaneously each pm. 2)  Glucophage 500 Mg Tabs (Metformin hcl) .... Take 2 tablets by mouth two times a day 3)  Truetrack Test Strp (Glucose blood) .... Use to test blood sugar 4-6 times a day- suggest at least before each meal and bedtime 4)  Lancets 30g Misc (Lancets) .... Use to check blood sugar 4-6 times daily 5)  Bactrim Ds 800-160 Mg Tabs (Sulfamethoxazole-trimethoprim) .... Take 1 tablet by mouth two times a day for 14 6)  Oxycodone-acetaminophen 5-325 Mg Tabs (Oxycodone-acetaminophen) .... Take 1-2 tablets by mouth three times a day as needed for pain  Patient Instructions: 1)  Please schedule a follow-up appointment on May 16th, 2011 2)  Take the Bactrim as advised below. Prescriptions: OXYCODONE-ACETAMINOPHEN 5-325 MG TABS (OXYCODONE-ACETAMINOPHEN) Take 1-2 tablets by mouth three times a day as needed for pain  #60 x 0   Entered and Authorized by:   Yolonda Kida MD   Signed by:   Yolonda Kida MD on 05/21/2009   Method used:   Print then Give to Patient   RxIDBG:[id number] BACTRIM DS 800-160 MG TABS (SULFAMETHOXAZOLE-TRIMETHOPRIM) Take 1 tablet by mouth two times a day for 14  #28 x 0   Entered and Authorized by:   Yolonda Kida MD   Signed by:   Yolonda Kida MD on 05/21/2009   Method used:   Print then Give to Patient   RxID:   [id number]  Process Orders Check Orders Results:     [facility]: ABN not required for this insurance Tests Sent for requisitioning (May 21, 2009 4:24 PM):     05/21/2009: [facility] -- T-Culture, Wound [[id number]] (signed)    Prevention & Chronic Care Immunizations   Influenza vaccine: Fluvax Non-MCR  (12/28/2008)    Tetanus booster: Not documented    Pneumococcal vaccine: Not documented  Other Screening   Smoking status: never  (05/21/2009)  Diabetes Mellitus   HgbA1C: 11.3  (05/21/2009)    Eye exam: Not documented    Foot exam: yes  (01/14/2009)   High risk foot: No   (05/08/2007)   Foot care education: Done  (05/08/2007)   Foot exam due: 05/07/2008    Urine microalbumin/creatinine ratio: 80.5  (12/28/2008)   Urine microalbumin action/deferral: Ordered  Self-Management Support :   Personal Goals (by the next clinic  visit) :     Personal A1C goal: 7  (05/21/2009)     Personal blood pressure goal: 130/80  (05/21/2009)     Personal LDL goal: 100  (05/21/2009)    Patient will work on the following items until the next clinic visit to reach self-care goals:     Medications and monitoring: take my medicines every day, check my blood sugar, weigh myself weekly, examine my feet every day  (05/21/2009)     Eating: drink diet soda or water instead of juice or soda, eat more vegetables, use fresh or frozen vegetables, eat foods that are low in salt, eat baked foods instead of fried foods, eat fruit for snacks and desserts, limit or avoid alcohol  (05/21/2009)     Activity: take the stairs instead of the elevator  (05/21/2009)    Diabetes self-management support: Resources for patients handout  (05/21/2009)   Last diabetes self-management training by diabetes educator: 01/14/2009      Resource handout printed.  Laboratory Results   Blood Tests   Date/Time Received: May 21, 2009 1:07 PM Date/Time Reported: Maryan Rued  May 21, 2009 1:07 PM   HGBA1C: 11.3%   (Normal Range: Non-Diabetic - 3-6%   Control Diabetic - 6-8%) CBG Random:: 550mg /dL  Comments: CBG Critical High Result of 550 reported to Dr Eyvonne Mechanic and Lamonte Richer NT @ [phone] by Catalina Antigua, PBT.  EMR chart "locked", unable to enter results into EMR until [place]  May 21, 2009 1:09 PM

## 2010-02-08 NOTE — Progress Notes (Signed)
Summary: rtc: abd pain/ hla  Phone Note Call from Patient   Summary of Call: rec'd a call from pt c/o abd pain, called ph# left, also listed on chart, and left a message that pt may call back to speak w/ someone. Initial call taken by: Freddy Finner RN,  July 19, 2009 10:51 AM  Follow-up for Phone Call        OK Follow-up by: Milta Deiters MD,  July 19, 2009 2:17 PM  Additional Follow-up for Phone Call Additional follow up Details #1::        pt called back and states he has had abd pain for 3 months, has appt scheduled w/ dr Marinda Elk wed am, he is encouraged to keep wed appt and if there are any increased symptoms he needs to go to ED or urgent care. states pai is no worse than it has been but does verb understanding that he may go to Ed or urgent care Additional Follow-up by: Freddy Finner RN,  July 19, 2009 3:26 PM    Additional Follow-up for Phone Call Additional follow up Details #2::    OK Follow-up by: Milta Deiters MD,  July 19, 2009 4:25 PM

## 2010-02-08 NOTE — Progress Notes (Signed)
Summary: phone/gg  Phone Note Call from Patient   Caller: Patient Summary of Call: Pt called and states he was seen at [facility] yesterday.  They lanced the cyst on his neck but it did not drain. He was given antibiotics and pain med and told to return to [facility] for recheck.  Will see Friday Initial call taken by: Gevena Cotton RN,  May 19, 2009 11:39 AM

## 2010-02-08 NOTE — Assessment & Plan Note (Signed)
Summary: COU/CH  Anticoagulant Therapy Managed by: [name]. Ceasar Lund  PharmD CACP PCP: Bertha Stakes MD [facility] Attending: Nance Pew MD, Hebert Soho  Indication 1: Deep vein thrombus Indication 2: Encounter for therapeutic drug monitoring  V58.83 Start date: 07/28/2009  Patient Assessment Reviewed by: Paulla Dolly PharmD  August 09, 2009 Medication review: verified warfarin dosage & schedule,verified previous prescription medications, verified doses & any changes, verified new medications, reviewed OTC medications, reviewed OTC health products-vitamins supplements etc Complications: none Dietary changes: none   Health status changes: none   Lifestyle changes: none   Recent/future hospitalizations: none   Recent/future procedures: none   Recent/future dental: none Patient Assessment Part 2:  Have you MISSED ANY DOSES or CHANGED TABLETS?  No missed Warfarin doses or changed tablets.  Have you had any BRUISING or BLEEDING ( nose or gum bleeds,blood in urine or stool)?  No reported bruising or bleeding in nose, gums, urine, stool.  Have you STARTED or STOPPED any MEDICATIONS, including OTC meds,herbals or supplements?  No other medications or herbal supplements were started or stopped.  Have you CHANGED your DIET, especially green vegetables,or ALCOHOL intake?  No changes in diet or alcohol intake.  Have you had any ILLNESSES or HOSPITALIZATIONS?  No reported illnesses or hospitalizations  Have you had any signs of CLOTTING?(chest discomfort,dizziness,shortness of breath,arms tingling,slurred speech,swelling or redness in leg)    No chest discomfort, dizziness, shortness of breath, tingling in arm, slurred speech, swelling, or redness in leg.     Treatment  Target INR: 2.0-3.0 INR: 5.9  Date: 08/09/2009 INR reflects regimen in: 5.9  New  Tablet strength: : 5mg  Regimen Out:     Sunday: 1/2 Tablet     Monday: 1 Tablet     Tuesday: 1/2 Tablet     Wednesday: 1 Tablet     Thursday: 1/2  Tablet      Friday: 1 Tablet     Saturday: 1 Tablet Total Weekly: 27.5mg /week mg  Next INR Due: 08/18/2009 Adjusted by: [name]. Elie Confer III PharmD CACP   Return to anticoagulation clinic:  08/18/2009 Time of next visit: 0900  Hold:  1 Days    Comments: Have reviewed patient's hospitalization note. Admitted 20-Jul-11 with recurrence of VTE (DVT). Patient was discharged on LMWH + warfarin "bridge" until seen 25-Jul-11 by PCP in [facility] when INR was 1.4.  He states he was discharged taking 5mg  warfarin by mouth once daily. Today, INR = 5.9  Will OMIT/HOLD x 1 dose and decrease to 27.5mg /wk until seen on 10-Aug-11. Patient is commuting from the [place] area--10-Aug-11 is when he will be back to [place]. We consulted/looked at the website: www.acforum.org (The Anticoagulation Forum) for a clinic location in the [place] area. There were none (anticoagulation clinics) listed upon the site. Patient was instructed to make contact ASAP with a PCP who can manage his warfarin since he will be matriculating to college/university in [place] later this month. He agrees and understands this.  Allergies: No Known Drug Allergies

## 2010-02-08 NOTE — Assessment & Plan Note (Signed)
Summary: HFU ADD PER GLADYS/JOINES/DS   Vital Signs:  Patient profile:   35 year old male Height:      73 inches (185.42 cm) Weight:      326.8 pounds (148.55 kg) BMI:     43.27 Temp:     97.7 degrees F (36.50 degrees C) oral Pulse rate:   105 / minute BP sitting:   121 / 69  Vitals Entered By: Hilda Blades Ditzler RN (July 28, 2009 2:02 PM) Is Patient Diabetic? Yes Did you bring your meter with you today? No Pain Assessment Patient in pain? no      Nutritional Status BMI of > 30 = obese Nutritional Status Detail appetite good CBG Result 116  Have you ever been in a relationship where you felt threatened, hurt or afraid?denies   Does patient need assistance? Functional Status Self care Ambulation Normal Comments Results of HIV. Past 6 days - swelling in legs and feet. Wants Ted stockings.   Primary Care Provider:  Bertha Stakes MD   History of Present Illness: Patient is a 35 year old man seen today for a follow up of his recent hospitalization for DKA.  He reports that since discharge he has obtained his insulin and is taking it as prescribed.  He has been checking his blood sugar and noted that it was 37.  He had only eaten a small breakfast but had taken his usual insulin dose.  He ate an apple and was able to bring it back up.  He is also overdue for a diabetic eye exam and would also like to see a podiatrist for the dry and cracked skin on his feet as well as an infected great toe nail.  His only other complaint is that his legs have been swollen over the last 6 days.  He denies any shortness of breath, chest pain.  He does get pain when he plantarflexes the ankle.  The left leg is more swollen the right.  He does have a history of PE and DVT. in 2004 and 2006.  Depression History:      The patient denies a depressed mood most of the day and a diminished interest in his usual daily activities.        The patient denies that he feels like life is not worth living, denies that he  wishes that he were dead, and denies that he has thought about ending his life.         Preventive Screening-Counseling & Management  Alcohol-Tobacco     Alcohol type: AT TIMES     Smoking Status: never  Caffeine-Diet-Exercise     Does Patient Exercise: yes     Type of exercise: Treadmill, bike, free weights.     Times/week: 4-5  Current Medications (verified): 1)  Novolin 70/30 70-30 %  Susp (Insulin Isophane & Regular) .... Inject 45 Units Subcutaneously Each Am and 40 Units Subcutaneously Each Pm. 2)  Glucophage 500 Mg Tabs (Metformin Hcl) .... Take 2 Tablets By Mouth Two Times A Day 3)  Truetrack Test  Strp (Glucose Blood) .... Use To Test Blood Sugar 4-6 Times A Day- Suggest At Least Before Each Meal and Bedtime 4)  Lancets 30g  Misc (Lancets) .... Use To Check Blood Sugar 4-6 Times Daily  Allergies (verified): No Known Drug Allergies  Past History:  Past medical, surgical, family and social histories (including risk factors) reviewed for relevance to current acute and chronic problems.  Past Medical History: Reviewed history from 10/26/2005  and no changes required. Diabetes mellitus, type I  Family History: Reviewed history from 05/08/2007 and no changes required. AODM in mother CHF aunt HTN several relatives No cancer  Social History: Reviewed history from 07/21/2009 and no changes required. Finishing school at [employer] this month, plans to transfer to 4 year school. 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  Review of Systems      See HPI  Physical Exam  Additional Exam:  General: Well developed, male in no acute distress  Head: Atraumatic, normocephalic with no signs of trauma  Eyes: PERRLA, EOM intact  Ears: TM intact  Nose: Nares patent, mucosa is pink and moist, no polyps noted  Mouth: Mucosa is pink and moist, dention,   Neck: Supple, full ROM, no thyromegaly or masses noted  Resp: Clear to ascultation bilaterally,  no wheezes, rales, or rhonchi noted  CV: Regular rate and rhythm with no murmurs, rubs, or gallops noted  Abdomen: Soft, non-tender, non-distended with normal bowel sounds  Musculoskelatal: ROM full with intact strength, no pain to palpation There is +1 edema in the right lower leg to the mid calf and +2 edema in the left lower leg to the knee.  There is no pain to palpation or calf squeeze and also no palpable cord. Neurologic: Alert and oriented x3, Cranial nerves II-XII grossly intact, DTR normal and symmetric  Pulses: Radial, brachial, carotid, femoral, dorsal pedis, and posterior tibial pulses equal and symmetric     Impression & Recommendations:  Problem # 1:  DIABETES MELLITUS, TYPE I (ICD-250.01) He is back on his normal regiment and doing okay. He has had occasional low blood sugars so he is encouraged to check his blood sugar and bring his meter to his next visit so we can see the pattern to his blood sugars and adjust his regiment accordingly.  We will schedule him to see Barnabas Harries, RDE sometime in the next two weeks and have him return to clinic in 1 month for follow up.  We will also get him setup for a referral to opthmology and podiatry.  I would also like to check a Bmet to ensure that his DKA is resolved.    His updated medication list for this problem includes:    Novolin 70/30 70-30 % Susp (Insulin isophane & regular) ..... Inject 45 units subcutaneously each am and 40 units subcutaneously each pm.    Glucophage 500 Mg Tabs (Metformin hcl) .Marland Kitchen... Take 2 tablets by mouth two times a day  Orders: T-Basic Metabolic Panel ([id number]) Diabetic Clinic Referral (Diabetic) Ophthalmology Referral (Ophthalmology) Podiatry Referral (Podiatry)  Labs Reviewed: Creat: 1.00 (07/21/2009)    Reviewed HgBA1c results: 12.2 (07/21/2009)  11.3 (05/21/2009)  Problem # 2:  DEEP VENOUS THROMBOPHLEBITIS, HX OF (ICD-V12.52) With the asymmetric leg swelling and his history of DVTs and a PE we  will send him for a venous duplex study today to check for DVT.  He will have the testing and return down to clinic to follow up.  Results of the Venous duplex study were telephoned down to me by [place] in the Vascular lab. Testing shows a DVT from the popliteal to the distal femoral vein on the left.  The patient will be admitted for management of this.  1600 pager was called and Dr. Wendee Beavers was made aware and agree with admission under Dr. Zenovia Jarred service.  Orders: LE Venous Duplex (DVT) (DVT)  Complete Medication List: 1)  Novolin 70/30 70-30 % Susp (  Insulin isophane & regular) .... Inject 45 units subcutaneously each am and 40 units subcutaneously each pm. 2)  Glucophage 500 Mg Tabs (Metformin hcl) .... Take 2 tablets by mouth two times a day 3)  Truetrack Test Strp (Glucose blood) .... Use to test blood sugar 4-6 times a day- suggest at least before each meal and bedtime 4)  Lancets 30g Misc (Lancets) .... Use to check blood sugar 4-6 times daily  Other Orders: Capillary Blood Glucose/CBG [id number])  Patient Instructions: 1)  We will schedule Podiatry, diabetic eye exam, and an appointment with Barnabas Harries the diabetes educator. 2)  All other medication and follow up appointments should be from the hospital team at the time of discharge. Prescriptions: GLUCOPHAGE 500 MG TABS (METFORMIN HCL) Take 2 tablets by mouth two times a day  #120 x 6   Entered and Authorized by:   Trish Fountain MD   Signed by:   Trish Fountain MD on 07/28/2009   Method used:   Print then Give to Patient   RxID:   [id number] NOVOLIN 70/30 70-30 %  SUSP (INSULIN ISOPHANE & REGULAR) Inject 45 units subcutaneously each AM and 40 units subcutaneously each PM.  #3 x 6   Entered and Authorized by:   Trish Fountain MD   Signed by:   Trish Fountain MD on 07/28/2009   Method used:   Print then Give to Patient   RxID:   [id number]  Process Orders Check Orders Results:     [facility]: [id number] not required for this insurance Tests Sent for requisitioning (July 28, 2009 5:17 PM):     07/28/2009: [facility] -- T-Basic Metabolic Panel 0000000 (signed)    Prevention & Chronic Care Immunizations   Influenza vaccine: Fluvax Non-MCR  (12/28/2008)   Influenza vaccine due: 09/09/2009    Tetanus booster: Not documented   Td booster deferral: Deferred  (07/28/2009)    Pneumococcal vaccine: Not documented   Pneumococcal vaccine deferral: Deferred  (07/28/2009)  Other Screening   Smoking status: never  (07/28/2009)  Diabetes Mellitus   HgbA1C: 12.2  (07/21/2009)    Eye exam: Not documented    Foot exam: yes  (01/14/2009)   High risk foot: No  (05/08/2007)   Foot care education: Done  (05/08/2007)   Foot exam due: 01/14/2010    Urine microalbumin/creatinine ratio: 80.5  (12/28/2008)   Urine microalbumin action/deferral: Ordered    Diabetes flowsheet reviewed?: Yes   Progress toward A1C goal: Deteriorated    Stage of readiness to change (diabetes management): Action  Self-Management Support :   Personal Goals (by the next clinic visit) :     Personal A1C goal: 7  (05/21/2009)     Personal blood pressure goal: 130/80  (05/21/2009)     Personal LDL goal: 100  (05/21/2009)    Patient will work on the following items until the next clinic visit to reach self-care goals:     Medications and monitoring: take my medicines every day, check my blood sugar, bring all of my medications to every visit, weigh myself weekly, examine my feet every day  (07/28/2009)     Eating: drink diet soda or water instead of juice or soda, eat more vegetables, use fresh or frozen vegetables, eat foods that are low in salt, eat baked foods instead of fried foods, eat fruit for snacks and desserts, limit or avoid alcohol  (07/28/2009)     Activity: take a 30 minute walk every day, take the stairs  instead of the elevator, park at the far end of the parking  lot  (07/28/2009)     Other: [place]  (07/21/2009)     Home glucose monitoring frequency: 3 times a day  (07/28/2009)    Diabetes self-management support: Written self-care plan, Resources for patients handout, Education handout  (07/28/2009)   Diabetes care plan printed   Diabetes education handout printed   Last diabetes self-management training by diabetes educator: 01/14/2009   Referred for diabetes self-mgmt training.      Resource handout printed.   Medication Administration  Infusion # 1:    Diagnosis: DEEP VENOUS THROMBOPHLEBITIS, HX OF (ICD-V12.52)    Started: 4:20PM    Administered by: Hilda Blades Ditzler RN-July 28, 2009 4:29 PM    Comments: # 22 NSL inserted right forearm.    Patient tolerated infusion without complications  Orders Added: 1)  Capillary Blood Glucose/CBG [82948] 2)  Est. Patient Level IV [id number] 3)  T-Basic Metabolic Panel 0000000 4)  LE Venous Duplex (DVT) [DVT] 5)  Diabetic Clinic Referral [Diabetic] 6)  Ophthalmology Referral [Ophthalmology] 7)  Podiatry Referral [Podiatry]   Appended Document: HFU ADD PER GLADYS/JOINES/DS CBG at 5:20PM 74 - OJ given. Dr Shon Baton aware. PSO2 96%.  Appended Document: HFU ADD PER GLADYS/JOINES/DS Report given to nurse and taken to Room 4733-2 via w/c at 6:15PM.

## 2010-02-08 NOTE — Discharge Summary (Signed)
Summary: Hospital Discharge Update    Hospital Discharge Update:  Date of Admission: 07/28/2009 Date of Discharge: 07/29/2009  Brief Summary:  Mr. Alford was admitted with left lower extremity DVT confirmed by venous dopplers in the [facility] on the day of admission.  He was admitted and given full dose lovenox bridge with coumadin.  His K+ was 2.7 on admission and required repletion.  On discharge his K+ was 4.5. He is to follow-up in the clinic for management of his coumadin on 08/02/09 with Dr. Barton Dubois as Dr. Johney Maine is on vacation.  At that time he will need a PT/INR check and B-Met.   Labs needed at follow-up: Basic metabolic panel, PT/INR  Medication list changes:  Added new medication of ENOXAPARIN SODIUM 150 MG/ML SOLN (ENOXAPARIN SODIUM) Inject 150mg  Subcutaneously two times a day for 5 days. Added new medication of WARFARIN SODIUM 7.5 MG TABS (WARFARIN SODIUM) Take 1 tablet by mouth once a day - Signed Rx of WARFARIN SODIUM 7.5 MG TABS (WARFARIN SODIUM) Take 1 tablet by mouth once a day;  #30 x 0;  Signed;  Entered by: Rikki Spearing, MD;  Authorized by: Rikki Spearing, MD;  Method used: Print then Give to Patient  Discharge medications:  NOVOLIN 70/30 70-30 %  SUSP (INSULIN ISOPHANE & REGULAR) Inject 45 units subcutaneously each AM and 40 units subcutaneously each PM. GLUCOPHAGE 500 MG TABS (METFORMIN HCL) Take 2 tablets by mouth two times a day TRUETRACK TEST  STRP (GLUCOSE BLOOD) use to test blood sugar 4-6 times a day- suggest at least before each meal and bedtime [BMN] LANCETS 30G  MISC (LANCETS) use to check blood sugar 4-6 times daily ENOXAPARIN SODIUM 150 MG/ML SOLN (ENOXAPARIN SODIUM) Inject 150mg  Subcutaneously two times a day for 5 days. WARFARIN SODIUM 7.5 MG TABS (WARFARIN SODIUM) Take 1 tablet by mouth once a day  Other patient instructions:  Please return to the outpatient medicine clinic on August 02, 2009 at 9:30am for an appointment with Dr. Dyann Kief to check your  INR and coumadin dosing.  Take your lovenox and coumadin as prescribed.  If you experience any shortness of breath, chest pain, or palpitations, please call 911 or go to your nearest emergency department.    Note: Hospital Discharge Medications & Other Instructions handout was printed, one copy for patient and a second copy to be placed in hospital chart.   Appended Document: Hospital Discharge Update    Clinical Lists Changes  Observations: Added new observation of PAST MED HX: Diabetes mellitus, type I DVT x 2 (June 2011 & 2004) PE 2006 Hypercoag panel 06/2009 negative      FVL     Protein C & S     Lupus anticoagulant     Homocysteine     Antiphospholipid Ab (08/27/2009 10:30)       Past History:  Past Medical History: Diabetes mellitus, type I DVT x 2 (June 2011 & 2004) PE 2006 Hypercoag panel 06/2009 negative      FVL     Protein C & S     Lupus anticoagulant     Homocysteine     Antiphospholipid Ab

## 2010-02-08 NOTE — Assessment & Plan Note (Signed)
Summary: eEST-CK/FU/MEDS/CFB   Vital Signs:  Patient profile:   35 year old male Height:      73 inches Weight:      311.2 pounds BMI:     41.21 Temp:     97.6 degrees F oral Pulse rate:   96 / minute BP sitting:   138 / 81  (right arm)  Vitals Entered By: Silverio Decamp NT II (July 21, 2009 11:51 AM) CC: CHECKUP Is Patient Diabetic? Yes Did you bring your meter with you today? No Pain Assessment Patient in pain? yes     Location: stomach Intensity: 3 Type: burning Onset of pain  2-3 days Nutritional Status BMI of > 30 = obese CBG Result 528  Have you ever been in a relationship where you felt threatened, hurt or afraid?No   Does patient need assistance? Functional Status Self care Ambulation Normal   Primary Care Provider:  Bertha Stakes MD  CC:  CHECKUP.  History of Present Illness: Patient presentes with complaint of high blood sugars (up to 528 last week) and abdominal pain with associated nausea which has worsened over the past 2 weeks.  The pain radiates from his upper abdomen to lower abdomen, is worse in the morning, waxes and wanes, and feels like his "stomach is full but hurts".  He has been out of his insulin since June 20, but has continued to take metformin.  He reports that he [place] [facility] because he has not been able to provide the needed documentation for his orange card.   Preventive Screening-Counseling & Management  Alcohol-Tobacco     Alcohol type: AT TIMES     Smoking Status: never  Caffeine-Diet-Exercise     Does Patient Exercise: yes     Type of exercise: Treadmill, bike, free weights.     Times/week: 4-5  Current Medications (verified): 1)  Novolin 70/30 70-30 %  Susp (Insulin Isophane & Regular) .... Inject 45 Units Subcutaneously Each Am and 40 Units Subcutaneously Each Pm. 2)  Glucophage 500 Mg Tabs (Metformin Hcl) .... Take 2 Tablets By Mouth Two Times A Day 3)  Truetrack Test  Strp (Glucose  Blood) .... Use To Test Blood Sugar 4-6 Times A Day- Suggest At Least Before Each Meal and Bedtime 4)  Lancets 30g  Misc (Lancets) .... Use To Check Blood Sugar 4-6 Times Daily  Allergies (verified): No Known Drug Allergies  Social History: Finishing school at [employer] this month, plans to transfer to 4 year school. 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  Review of Systems General:  Complains of loss of appetite; denies chills, fever, and sweats. CV:  Denies chest pain or discomfort and swelling of feet. Resp:  Denies shortness of breath. GI:  Complains of abdominal pain, loss of appetite, and nausea; denies bloody stools and vomiting; reports loose bowel movements; has seen some BRB on paper in small amounts; last BM Monday night. GU:  Complains of urinary frequency.  Physical Exam  General:  alert, no distress Lungs:  normal respiratory effort, normal breath sounds, no crackles, and no wheezes.   Heart:  normal rate, regular rhythm, no murmur, no gallop, and no rub.   Abdomen:  soft, non-tender, normal bowel sounds, no guarding, no rigidity, no hepatomegaly, and no splenomegaly.   Extremities:  no edema   Impression & Recommendations:  Problem # 1:  DIABETES W/KETOACIDOSIS TYPE I [JUV] UNCNTRL (ICD-250.13) Patient  has DKA secondary to absence of insulin.  Plan is to admit for IV volume replacement, insulin, and monitoring.  Long-term, better compliance with his medical regimen and follow-up is absolutely necessary if patient is to avoid complications from his uncontrolled diabetes.   His updated medication list for this problem includes:    Novolin 70/30 70-30 % Susp (Insulin isophane & regular) ..... Inject 45 units subcutaneously each am and 40 units subcutaneously each pm.    Glucophage 500 Mg Tabs (Metformin hcl) .Marland Kitchen... Take 2 tablets by mouth two times a day  Labs Reviewed: Creat: 0.85 (01/14/2009)    Reviewed HgBA1c results: 12.2  (07/21/2009)  11.3 (05/21/2009)  Problem # 2:  ABDOMINAL PAIN (ICD-789.00) Likely due to DKA.  If his pain persists as the acidosis is corrected, consider abdominal CT scan and further evaluation.  Orders: T-Comprehensive Metabolic Panel ([id number]) T-CBC w/Diff [id number]) T-Lipase ([id number]) T-Urinalysis [id number])  Problem # 3:  SPECIAL SCR EXAMINATION UNSPEC VIRAL DISEASE (ICD-V73.99) Patient requested HIV screening test; he denied known exposure.  Orders: T-HIV Antibody  (Reflex) [id number])  Complete Medication List: 1)  Novolin 70/30 70-30 % Susp (Insulin isophane & regular) .... Inject 45 units subcutaneously each am and 40 units subcutaneously each pm. 2)  Glucophage 500 Mg Tabs (Metformin hcl) .... Take 2 tablets by mouth two times a day 3)  Truetrack Test Strp (Glucose blood) .... Use to test blood sugar 4-6 times a day- suggest at least before each meal and bedtime 4)  Lancets 30g Misc (Lancets) .... Use to check blood sugar 4-6 times daily  Other Orders: T- Capillary Blood Glucose [id number]) T-Hgb A1C (in-house) [id number])  Prevention & Chronic Care Immunizations   Influenza vaccine: Fluvax Non-MCR  (12/28/2008)   Influenza vaccine due: 09/09/2009    Tetanus booster: Not documented    Pneumococcal vaccine: Not documented  Other Screening  Reports requested:  Smoking status: never  (07/21/2009)  Diabetes Mellitus   HgbA1C: 12.2  (07/21/2009)    Eye exam: Not documented   Last eye exam report requested.    Foot exam: yes  (01/14/2009)   High risk foot: No  (05/08/2007)   Foot care education: Done  (05/08/2007)   Foot exam due: 01/14/2010    Urine microalbumin/creatinine ratio: 80.5  (12/28/2008)   Urine microalbumin action/deferral: Ordered    Diabetes flowsheet reviewed?: Yes   Progress toward A1C goal: Unchanged  Self-Management Support :   Personal Goals (by the next clinic visit) :     Personal A1C goal: 7  (05/21/2009)      Personal blood pressure goal: 130/80  (05/21/2009)     Personal LDL goal: 100  (05/21/2009)    Patient will work on the following items until the next clinic visit to reach self-care goals:     Medications and monitoring: take my medicines every day, examine my feet every day  (07/21/2009)     Eating: drink diet soda or water instead of juice or soda, eat more vegetables, use fresh or frozen vegetables, eat foods that are low in salt, eat baked foods instead of fried foods, eat fruit for snacks and desserts  (07/21/2009)     Activity: take a 30 minute walk every day  (07/21/2009)     Other: [place]  (07/21/2009)    Diabetes self-management support: Education handout  (07/21/2009)   Diabetes education handout printed   Last diabetes self-management training by diabetes educator: 01/14/2009   Nursing Instructions: Request report of last diabetic eye  exam    Process Orders Check Orders Results:     [facility]: ABN not required for this insurance Tests Sent for requisitioning (July 21, 2009 3:00 PM):     07/21/2009: [facility] -- T-Comprehensive Metabolic Panel [id number] (signed)     07/21/2009: [facility] -- T-CBC w/Diff [id number] (signed)     07/21/2009: [facility] -- T-Lipase [id number] (signed)     07/21/2009: [facility] -- T-Urinalysis [id number] (signed)     07/21/2009: [facility] -- T-HIV Antibody  (Reflex) [id number] (signed)    Laboratory Results   Blood Tests   Date/Time Received: July 21, 2009 12:21 PM  Date/Time Reported: .  HGBA1C: 12.2%   (Normal Range: Non-Diabetic - 3-6%   Control Diabetic - 6-8%) CBG Random:: 528mg /dL      Appended Document: eEST-CK/FU/MEDS/CFB Report given to [facility] for Room 3302 at 4:50PM.  Appended Document: eEST-CK/FU/MEDS/CFB Attempted  IV start x 2;unsuccessful - IV team called.

## 2010-02-08 NOTE — Progress Notes (Signed)
Summary: phone/gg  Phone Note Call from Patient   Caller: Patient Summary of Call: Pt called stating he is Diabetic and has a fungus on rt great toe.  Toenail is out with dead skin on it.  Onset yesterday. Will see today to evaluate Initial call taken by: Gevena Cotton RN,  April 22, 2009 10:16 AM

## 2010-02-08 NOTE — Progress Notes (Signed)
Summary: cyst/ hla  Phone Note Call from Patient   Summary of Call: pt calls to say he has a pingpong ball size "cyst" on the back of his neck, this was noted this weekend and has gotten larger and more painful, he cannot rest well and cannot move his head well due to this. he is first offered an appt for thurs but he states he doesn't know if he can wait, he is then instructed to go to [facility] but he states he would prefer to come to clinic, i have told him i will speak w/ the attending and call him back. Initial call taken by: Freddy Finner RN,  May 18, 2009 11:38 AM  Follow-up for Phone Call        I am not going to be able to work him in during the next 30 minutes as I am finsihing up an admission from clinic. . If there is space this afternoon he could possibly be seen but I will not be here myself in the afternoon. Please send back to the opc desktop Follow-up by: Alcide Evener MD,  May 18, 2009 12:04 PM  Additional Follow-up for Phone Call Additional follow up Details #1::        spoke w/ pt he will go to [facility] Additional Follow-up by: Freddy Finner RN,  May 18, 2009 1:52 PM

## 2010-02-08 NOTE — Discharge Summary (Signed)
Summary: Hospital Discharge Update    Hospital Discharge Update:  Date of Admission: 07/21/2009 Date of Discharge: 07/22/2009  Brief Summary:  Pt is a 35yo M with Hx of DM type 1, multiple hospitalizations for DKA 2/2 to medication noncompliance (last 9/07) who was a direct admit from the [facility] with diagnosis of mild DKA. Essentially the pt had not been taking insulin since June 20th, still taking Metformin, presented to [facility] with 2 week hx of hyperglycemia, nausea, abdminal pain. He was admitted to SDU, given aggressive fluids, 10 Units insulin, and restarted on his home insulin regimen. Intial Labs revealed AG of 16, Bicarb 21 and UA showed ketones.  Repeat labs confirmed that no longer in metabolic acidosis,  AG closed, CBG 93. Pt having constipation on discharge, will be discharged on Colace, with likely improvement expected BM with continued improving hydration at home.  Pt was asked about how he would obtain insulin on discharge, and could not give specifics but insists he will get insulin (already has prescription) immediately on discharge, and states he intends to be compliant with medications.  Lab or other results pending at discharge:  None  Labs needed at follow-up: Basic metabolic panel  Other follow-up issues:  Pt started on Colace for constipation, please reeval.  Medication list changes:  Added new medication of COLACE 100 MG CAPS (DOCUSATE SODIUM) Take one cap by mouth once daily, as needed constipation. - Signed Rx of COLACE 100 MG CAPS (DOCUSATE SODIUM) Take one cap by mouth once daily, as needed constipation.;  #30 x 1;  Signed;  Entered by: Chilton Greathouse DO;  Authorized by: Chilton Greathouse DO;  Method used: Print then Give to Patient  Discharge medications:  NOVOLIN 70/30 70-30 %  SUSP (INSULIN ISOPHANE & REGULAR) Inject 45 units subcutaneously each AM and 40 units subcutaneously each PM. GLUCOPHAGE 500 MG TABS (METFORMIN HCL) Take 2 tablets by mouth two  times a day TRUETRACK TEST  STRP (GLUCOSE BLOOD) use to test blood sugar 4-6 times a day- suggest at least before each meal and bedtime [BMN] LANCETS 30G  MISC (LANCETS) use to check blood sugar 4-6 times daily COLACE 100 MG CAPS (DOCUSATE SODIUM) Take one cap by mouth once daily, as needed constipation.  Other patient instructions:  Please be careful to continue on his insulin as instructed. If you run out of insulin (and ideally, a few days prior to running out), please be certain to call the clinic and inform us,  to hopefully prevent future DKA episodes secondary to not taking insulin and/or medications.   Please follow-up with Dr. Marinda Elk in the [facility] on 07/28/09 at 10:00AM, at which time a blood test will be performed to reassess your diabtes and sugar status.   You will be started on a medication to help with constipation, if you continue to have trouble, please either call clinic or discuss with Dr. Marinda Elk on your hospital follow-up visit.  If hyperglycemic episodes recur with associated abdominal pain, nausea, etc, please call clinic and/ or go to ER.  Note: Hospital Discharge Medications & Other Instructions handout was printed, one copy for patient and a second copy to be placed in hospital chart.

## 2010-02-08 NOTE — Initial Assessments (Signed)
INTERNAL MEDICINE ADMISSION HISTORY AND PHYSICAL  Attending:  Chinita Pester, MD 1st contact:  J. Shon Baton, [place] 2nd contact:  Z. Ronny Flurry, MD [phone]  PCP: Glenice Bow, MD @[facility]   CC: bilateral leg swelling  HPI: Patient is a 35 year old man with h/o Type 1 DM, DVT and PE who was admitted from the [facility] for bilateral leg swelling.  The patient was seen in clinic for  follow up of his recent hospitalization for DKA 2/2 noncompliance/financial issues obtaining insulin.  He reports that since discharge he has obtained his insulin and is taking it as prescribed.  He has been checking his blood sugar and noted that it was 37 today.  He had only eaten a small breakfast but had taken his usual insulin dose.  He ate an apple and was able to bring it back up.  He is also overdue for a diabetic eye exam and would also like to see a podiatrist for the dry and cracked skin on his feet as well as an infected great toe nail.  His only other complaint at the clinic was bilateral leg swelling for the past 6 days, L>R.  He states he feels pain when he plantarflexes his left ankle. The patient has a history of PE in 2004 and DVT in 2006 (he completed a 6 month course of coumadin at that time).  The patient states that he has been treated in the past year for bilateral leg swelling and it resolved after a 3 day course of diuretics and wearing compression stockings.  He endorses feeling somewhat short of breath as compared to normal this weekend with vigorous exertion, however he denies feeling shortness of breath now in the clinic or at rest.  No chest pain, palpitations, fevers, abdominal pain, N/V, sick contacts.  He does state he had one episode of loose stools last night.  He states he feels lightheaded and believes that his sugar may have dropped given that he has eaten little today.  The patient denies any family history of blood clots.    Bilateral lower extremity dopplers done in the clinic showed no clot on the  right side but did show clot burden from the popliteal fossa to the femoral crease of the left lower extremity.  ALLERGIES: NKDA   PAST MEDICAL HISTORY: h/o DVT 2006 - completed 6 month course coumadin h/o PE 2004 h/o Mild diabetic ketoacidosis July 2011 - likely 2/2 to medication noncompliance Diabetes mellitus type 1 - uncontrolled.  Last hemoglobin A1c on July 21, 2009, was 12.2.  Admitted last week for hyperglycemia with anion gap 2/2 noncompliance.   MEDICATIONS: NOVOLIN 70/30 70-30 %  SUSP (INSULIN ISOPHANE & REGULAR) Inject 45 units subcutaneously each AM and 40 units subcutaneously each PM. 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   FAMILY HISTORY Family History: AODM in mother CHF aunt HTN several relatives No cancer  No family h/o blood clots  ROS: Patient states he has had more concentrated urine than normal for the past few days.  No  dysuria or increased frequency of urination.  VITALS: T:97.7  P:105  BP:121/69  R:12  O2SAT: 96%  ON:RA  PHYSICAL EXAM: GEN:  obese male in NAD HEENT:  PERRL, EOM, no lymphadenopathy, no thyroidmegaly, oral mucosa pink and moist CV:  tachycardic to  ~90, regular rhythm PULM:  CTAB, no increased work of breathing, no wheezes or crackles ABD: obese, +bs, soft, nt/nd EXT:  1+ pitting edema LLE to knee (left calf diameter 49cm), tr edema RLE to knee (right calf diameter 48cm). There is no pain to palpation or calf squeeze and also no palpable cord. MSK:  ROM full with intact strength, no pain to palpation.  NEURO:  Alert  and oriented x3, Cranial nerves II-XII grossly intact, DTR normal and symmetric  PULSES:  Radial, brachial, carotid, femoral, dorsal pedis, and posterior tibial pulses equal and symmetric    LABS:  ASSESSMENT AND PLAN:  (1) Left lower extremity DVT:  Patient has a history of 2 previous episodes of clotting, however he states that he was never told that he would need lifelong anticoagulation.  He has no family history of blood clots and states that he has never been told why he developed the blood clots in 2004 or 2006.  The reason for his hypercoaguable state is unclear. He is obese, however he has not been immobile or sedentary recently (his last admission was less than 24 hr stay).   Malignancy as a cause for his hypercoaguable state is unlikely given his age.  However, per hospital records, he has never had hypercoaguable lab studies performed. - admit to telemetry bed - lovenox full dose bridge with coumadin per pharmacy - EKG on floor and AM EKG - AM CBC - hypercoagulability panel  Will discuss with attending the need for CT Angio because doing this test would not change treatment (t-PA) if the patient develops right heart strain or clinically deteriorates in the setting of DVT.  (2)Type I DM, poorly controlled:  The patient was recently hospitalized (07/21/09-07/22/09) for hyperglycemia with anion gap 2/2 not taking his insulin for 2 week because of financial difficulties buying his insulin.  The patient states that he had been compliant with his regimen since discharge.  He had an episode of low blood sugar today prior to coming to clinic and felt lightheaded in clinic with a CBG of 72 (he was then given orange juice).  He reports not eating much today - he states he was planning on getting food after leaving the clinic today, but the decision to admit him to the hospital has delayed that plan.  - BMet on floor - AM BMet - continue patient's home insulin dose - CBGs qac and qhs - hold  patient's metformin    (3) FEN - carb-modified diet - NS @50cc /hr    (4)VTE PROPH: unnecessary given problem #1 (see above)

## 2010-02-08 NOTE — Assessment & Plan Note (Signed)
Summary: CHECKUP/ SB.   Vital Signs:  Patient profile:   35 year old male Height:      73 inches (185.42 cm) Weight:      341.0 pounds (155 kg) BMI:     45.15 Temp:     97.6 degrees F (36.44 degrees C) oral Pulse rate:   81 / minute BP sitting:   130 / 76  (left arm)  Vitals Entered By: Hilda Blades Ditzler RN (January 14, 2009 2:43 PM) Is Patient Diabetic? Yes Did you bring your meter with you today? No Pain Assessment Patient in pain? yes     Location: feet  Intensity: 1.5 Onset of pain  swollen since last vs Nutritional Status BMI of > 30 = obese Nutritional Status Detail appetite good CBG Result 119  Have you ever been in a relationship where you felt threatened, hurt or afraid?denies   Does patient need assistance? Functional Status Self care Ambulation Normal Comments FU - urination down and feet swollen and sore since last vs. CBG at 5PM 81 - pt aware. Knee-hi Ted stockings given to pt - calf 19.25 ' and length 20".   Primary Care Provider:  Bertha Stakes MD   History of Present Illness: This is a  year old man with past medical history of diabetes.  He is here for 2 week follow up to discuss diabetes managment.  He reports that since his last visit his feet have swollen and his urine output has gone down.  Urine is light in color.  His pants are tighter around the waist.  He has had no trouble breathing, no chest pain, no facial or oral swelling and no upper ext swelling.  He has increased his 70/30 from 40 units bid to 45 units bid, has started metformin at 500mg  daily and has started lisinopril 5 daily.  He reports checking cbg's sporatically, but does not have his meter.  Reports cbg's >100 during the day but has had very low blood sugars at night- as low as 49.  two nights in a row.  Hypoglycemia woke him from sleep and he ate peppermints to resolve.      Depression History:      The patient denies a depressed mood most of the day and a diminished interest in his  usual daily activities.         Preventive Screening-Counseling & Management  Alcohol-Tobacco     Alcohol type: AT TIMES     Smoking Status: never  Caffeine-Diet-Exercise     Does Patient Exercise: yes     Type of exercise: Treadmill, bike, free weights.     Times/week: 4-5  Current Medications (verified): 1)  Novolin 70/30 70-30 %  Susp (Insulin Isophane & Regular) .... Inject 45 Units Subcutaneously Each Am and 45  Units Subcutaneously Each Pm 2)  Glucophage 500 Mg Tabs (Metformin Hcl) .... One Tablet Daily. 3)  Lisinopril 5 Mg Tabs (Lisinopril) .... Take One Tablet Daily.  Allergies (verified): No Known Drug Allergies  Review of Systems       per hpi  Physical Exam  General:  alert and overweight-appearing.   Mouth:  good dentition and pharynx pink and moist.  no swelling. Lungs:  normal respiratory effort and normal breath sounds.   Heart:  normal rate, regular rhythm, and no murmur.   Pulses:  not palpable. Extremities:  3+ edema to knee bilaterally. Neurologic:  alert & oriented X3, cranial nerves II-XII intact, and gait normal.   Psych:  Oriented X3, memory intact for recent and remote, normally interactive, and good eye contact.    Diabetes Management Exam:    Foot Exam (with socks and/or shoes not present):       Sensory-Monofilament:          Left foot: normal          Right foot: normal   Impression & Recommendations:  Problem # 1:  EDEMA (ICD-782.3)  Bilateral LE edema with a 33 lb weight gain in 18 days.  Very concerning.  Most likely cause is lisinopril.  Will check STAT CMET and UA (normal).  If renal function is OK and this is not nephrotic syndrome than will give a few doses of lasix and TED hose to knee and send home OFF lisinopril.  He has had no shortness of breath or chest pain, I do not think this is cardiac.  He will rtc in one week to check progress.   Orders: T-Comprehensive Metabolic Panel ([id number]) T-Urinalysis  [id number]) T-Urinalysis Dipstick only [id number])  His updated medication list for this problem includes:    Furosemide 40 Mg Tabs (Furosemide) .Marland Kitchen... Take one tablet two times a day for 3 days.  Problem # 2:  DIABETES MELLITUS, TYPE I (ICD-250.01) CBG's of 40's at night worrysome.  He has also made major changes in diet and started metformin (though he did not titrate up as instructed... I will not push this today given question of renal function and hypoglycemia).  Have discussed with DR and Dr. Eppie Gibson.  Will decrease dose of 70/30 back to 40 units for evening dose.  He has met with DR today for further dm education.  I am a little concerned that he may NOT have been taking any insuling prior to these past two weeks (resulting in a1c of 13.4) and that the current doses may still be too high. I discussed this with him and he agreed to check cbg qid and call for assistance if cbg's are <70 or if he is having symptoms of hypoglycemia.   The following medications were removed from the medication list:    Lisinopril 5 Mg Tabs (Lisinopril) .Marland Kitchen... Take one tablet daily. His updated medication list for this problem includes:    Novolin 70/30 70-30 % Susp (Insulin isophane & regular) ..... Inject 45 units subcutaneously each am and 40 units subcutaneously each pm.    Glucophage 500 Mg Tabs (Metformin hcl) ..... One tablet daily.  Problem # 3:  PROTEINURIA, MILD (ICD-791.0) Assessment: Comment Only stopped lisinopril as it is likely the cause of new profound lower extremity edema.  if resolves off lisinopril will consider an ARB for this issue.  Complete Medication List: 1)  Novolin 70/30 70-30 % Susp (Insulin isophane & regular) .... Inject 45 units subcutaneously each am and 40 units subcutaneously each pm. 2)  Glucophage 500 Mg Tabs (Metformin hcl) .... One tablet daily. 3)  Truetrack Test Strp (Glucose blood) .... Use to test blood sugar 4-6 times a day- suggest at least before each meal and  bedtime 4)  Lancets 30g Misc (Lancets) .... Use to check blood sugar 4-6 times daily 5)  Furosemide 40 Mg Tabs (Furosemide) .... Take one tablet two times a day for 3 days.  Other Orders: Capillary Blood Glucose/CBG [id number]) Capillary Blood Glucose/CBG [id number])  Patient Instructions: 1)  Stop taking lisinopril. 2)  Use the compression stockings during the day, especially while standing up. 3)  You have a prescription for lasix to help get rid of  excess fluid. 4)  Please make a follow up appointment in one week.  Bring your meter to this appointment. 5)  Decrease dose of 70/30 to 45 units in the morning and 40 units in the evening. Prescriptions: FUROSEMIDE 40 MG TABS (FUROSEMIDE) Take one tablet two times a day for 3 days.  #6 x 0   Entered and Authorized by:   Myrtis Ser MD   Signed by:   Myrtis Ser MD on 01/14/2009   Method used:   Print then Give to Patient   RxID:   [id number] LANCETS 30G  MISC (LANCETS) use to check blood sugar 4-6 times daily  #200 x 10   Entered by:   Barnabas Harries RD,CDE   Authorized by:   Myrtis Ser MD   Signed by:   Myrtis Ser MD on 01/14/2009   Method used:   Print then Give to Patient   RxID:   [id number] TEST  STRP (GLUCOSE BLOOD) use to test blood sugar 4-6 times a day- suggest at least before each meal and bedtime Brand medically necessary #200 x 10   Entered by:   Barnabas Harries RD,CDE   Authorized by:   Myrtis Ser MD   Signed by:   Myrtis Ser MD on 01/14/2009   Method used:   Print then Give to Patient   RxID:   [id number]  Process Orders Check Orders Results:     [facility]: ABN not required for this insurance Tests Sent for requisitioning (January 14, 2009 9:02 PM):     01/14/2009: [facility] -- T-Comprehensive Metabolic Panel [id number] (signed)     01/14/2009: [facility] -- T-Urinalysis [id number] (signed)    Prevention & Chronic  Care Immunizations   Influenza vaccine: Fluvax Non-MCR  (12/28/2008)    Tetanus booster: Not documented    Pneumococcal vaccine: Not documented  Other Screening   Smoking status: never  (01/14/2009)  Diabetes Mellitus   HgbA1C: 13.5  (12/28/2008)    Eye exam: Not documented    Foot exam: yes  (01/14/2009)   High risk foot: No  (05/08/2007)   Foot care education: Done  (05/08/2007)   Foot exam due: 05/07/2008    Urine microalbumin/creatinine ratio: 80.5  (12/28/2008)   Urine microalbumin action/deferral: Ordered  Self-Management Support :    Patient will work on the following items until the next clinic visit to reach self-care goals:     Medications and monitoring: take my medicines every day, check my blood sugar, bring all of my medications to every visit, weigh myself weekly, examine my feet every day  (01/14/2009)     Eating: drink diet soda or water instead of juice or soda, eat more vegetables, use fresh or frozen vegetables, eat foods that are low in salt, eat baked foods instead of fried foods, eat fruit for snacks and desserts  (01/14/2009)     Activity: take a 30 minute walk every day, take the stairs instead of the elevator, park at the far end of the parking lot  (01/14/2009)    Diabetes self-management support: Not documented   Last LDL:                                                 96 (12/28/2008 6:21:00 PM)        Diabetic Foot Exam Foot Inspection  Is there a history of a foot ulcer?              No Is there a foot ulcer now?              No Can the patient see the bottom of their feet?          Yes Are the shoes appropriate in style and fit?          Yes Is there swelling or an abnormal foot shape?          No Are the toenails long?                No Are the toenails thick?                No Are the toenails ingrown?              No Is there heavy callous build-up?              No Is there a claw toe deformity?                          No Is  there elevated skin temperature?            No Is there limited ankle dorsiflexion?            No Is there foot or ankle muscle weakness?            No Do you have pain in calf while walking?           No         10-g (5.07) Semmes-Weinstein Monofilament Test Performed by: Hilda Blades Ditzler RN          Right Foot          Left Foot Visual Inspection               Test Control      normal         normal Site 1         normal         normal Site 2         normal         normal Site 3         normal         normal Site 4         normal         normal Site 5         normal         normal Site 6         normal         normal Site 7         normal         normal Site 8         normal         normal Site 9         normal         normal Site 10         normal         normal  Impression      normal         normal  Laboratory Results   Urine Tests  Date/Time Received: 01/14/09 3:14PM Date/Time Reported: same  Routine Urinalysis   Color: yellow Appearance: Clear Glucose: negative   (Normal Range: Negative)  Bilirubin: negative   (Normal Range: Negative) Ketone: negative   (Normal Range: Negative) Spec. Gravity: 1.015   (Normal Range: 1.003-1.035) Blood: negative   (Normal Range: Negative) pH: 5.0   (Normal Range: 5.0-8.0) Protein: negative   (Normal Range: Negative) Urobilinogen: 0.2   (Normal Range: 0-1) Nitrite: negative   (Normal Range: Negative) Leukocyte Esterace: negative   (Normal Range: Negative)     Blood Tests     CBG Random:: 119mg /dL

## 2010-02-08 NOTE — Initial Assessments (Signed)
Summary: HOSPITAL ADMISSION  INTERNAL MEDICINE ADMISSION HISTORY AND PHYSICAL  First Contact: Dr. Chilton Greathouse [phone]) Second Contact: Dr. Eyvonne Mechanic [phone])  Weekends, Holidays, or after 5pm weekdays:  First Contact: [phone] Second Contact:[phone]   PCP: Dr. Bertha Stakes  CC: Elevated blood sugars with abdominal pain and nausea x 2 weeks.   HPI: Pt is a 35 y/o M with a PMHx significant for DM, Type 1, who presented to [facility] this after with a complaint of high blood sugars (up to 528 last week) and abdominal pain with associated nausea which has worsened over the past 2 weeks.  The pain radiates from his upper abdomen to lower abdomen, is worse in the morning, waxes and wanes, and feels like his "stomach is full but hurts".  He has been out of his insulin since June 20, but has continued to take metformin.  He reports that he has not been able to [place] [facility] because he has not been able to provide the needed documentation for his orange card. Pt indicated he was just being relaxed about his medications, and admittedly was being knowingly noncompliant with his insulin. With associated loss of appetite, polyuria, polydipsia, and loose bowel movements over this time frame. Denies fevers, chills, sweats, chest pain, shortness of breath.   ALLERGIES: Allergies:   PAST MEDICAL HISTORY: Diabetes mellitus, type I   MEDICATIONS: 1) NOVOLIN 70/30 70-30 %  SUSP (INSULIN ISOPHANE & REGULAR) Inject 45 units subcutaneously each AM and 40 units subcutaneously each PM. 2) GLUCOPHAGE 500 MG TABS (METFORMIN HCL) Take 2 tablets by mouth two times a day 3) TRUETRACK TEST  STRP (GLUCOSE BLOOD) use to test blood sugar 4-6 times a day- suggest at least before each meal and bedtime [BMN] 4) LANCETS 30G  MISC (LANCETS) use to check blood sugar 4-6 times daily  SOCIAL HISTORY: Finishing school at [employer] in Dec, plans to transfer  to 4 year school. Works at [employer]. Currently unemployed.  FAMILY HISTORY AODM in mother CHF aunt HTN several relatives No cancer  ROS: As per HPI  VITALS: Temp:     97.6 degrees F oral Pulse rate:   96 / minute BP sitting:   138 / 81  (right arm)  PHYSICAL EXAM: Gen: Patient is in NAD, Pleasant. Eyes: PERRL, EOMI, No signs of anemia or jaundince. ENT: MMM, OP clear, No erythema, thrush or exudates. Neck: Supple, No carotid Bruits, No JVD, No thyromegaly Resp: CTA- Bilaterally, No W/C/R. CVS: S1S2 RRR, No M/R/G GI: Abdomen is soft. ND, NT, NG, NR, BS+. No organomegaly. Ext: No pedal edema, cyanosis or clubbing. Skin: No visible rashes, scars. Lymph: No palpable lymphadenopathy. MS: Moving all 4 extremities. Neuro: A&O X3, CN II - XII are grossly intact. Motor strength is 5/5 in the all 4 extremities, Sensations intact to light touch, Gait normal, Cerebellar signs negative. Psych: Appropriate   LABS: Tests: (1) CBC with Diff (10010)    WBC                       7.5 K/uL                    (4.0-10.5)   RBC                       5.23 MIL/uL                 (4.22-5.81  Hemoglobin                15.3 g/dL                   (13.0-17.0   Hematocrit                46.0 %                      (39.0-52.0   MCV                       87.9 fL                     (78.0-100. ! MCH                       29.3 pg                     (26.0-34.0   MCHC                      33.4 g/dL                   (30.0-36.0   RDW                       13.8 %                      (11.5-15.5   Platelet Count            185 K/uL                    (150-400)   Granulocyte %             70 %                        (43-77)   Absolute Gran             5.2 K/uL                    (1.7-7.7)   Lymph %                   20 %                        (12-46)   Absolute Lymph            1.5 K/uL                    (0.7-4.0)   Mono %                    8 %                         (3-12)   Absolute Mono              0.6 K/uL                    (0.1-1.0)   Eos %                     2 %                         (  0-5)   Absolute Eos              0.2 K/uL                    (0.0-0.7)   Baso %                    0 %                         (0-1)   Absolute Baso             0.0 K/uL                    (0.0-0.1)   Smear Review       RESULT: Criteria for review not met  Tests: (2) Comprehensive Metabolic Panel (123456)   Sodium               [L]  131 mEq/L (P)               (135-145)   Potassium                 4.8 mEq/L (P)               (3.5-5.3)     HEMOLYZED SPECIMEN.   Chloride             [L]  94 mEq/L (P)                (96-112)   CO2                       21 mEq/L (P)                (19-32)   Glucose                   * mg/dL (P)                 (70-99)   BUN                       9 mg/dL (P)                 (6-23)   Creatinine                1.00 mg/dL (P)              (0.40-1.50   Bilirubin, Total     [H]  1.8 mg/dL (P)               (0.3-1.2)   Alkaline Phosphatase      82 U/L (P)                  (39-117)   AST/SGOT                  20 U/L (P)                  (0-37)   ALT/SGPT                  16 U/L (P)                  (0-53)   Total Protein             7.8 g/dL (P)                (  6.0-8.3)   Albumin                   4.1 g/dL (P)                (3.5-5.2)   Calcium                   9.5 mg/dL (P)               (8.4-10.5)  Tests: (3) Lipase MC:[id number])   Lipase                    57 U/L                      (11-59)  Tests: (4) Urinalysis Reflex (65000)   Color                     YELLOW                      (YELLOW)   Appearance                CLEAR                       (CLEAR)   Specific Gravity     [H]  1.039                       (1.005-1.0   pH                        5.5                         (5.0-8.0)   Glucose              [A]  1000 mg/dL                  (NEG)   Bilirubin                 NEG                         (NEG)   Ketone               [A]  40 mg/dL                     (NEG)   Blood                     NEG                         (NEG)   Protein                   NEG mg/dL                   (NEG)   Urobilinogen              0.2 mg/dL                   (0.0-1.0)   Nitrite                   NEG                         (  NEG)   Leukocyte Esterase        NEG                         (NEG)  GLUCOSE              [H]  528 mg/dL                   70-99  ASSESSMENT AND PLAN:  1. Mild Diabetic Ketoacidosis  Secondary to Insulin non-compliance With an AG of 16 and a Bicarb of 21. Given his Abdominal pain, nausea and severe dehydration, will admit him to SDU  Plans: Admit to SDU IVfluid resuscitation with NS Given mild AG and a Bicarb of 21, will give 10 units of regular Insulin and resume his home dose of Insulin (70/30) Check CBG's QAC and QHS Suspect no further exacerbating factors to this DKA Continue home dose of Metformin Will plan D/C in 1-2 days  2. VTE PPX Lovenox     I performed and/or observed a history and physical examination of the patient.  I discussed the case with the residents as noted and reviewed the residents' notes.  I agree with the findings and plan--please refer to the attending physician note for more details.  Signature  Printed Name  [place]: ENT: mucous membranes were dry, OP clear, No erythema, thrush or exudates.

## 2010-02-08 NOTE — Assessment & Plan Note (Signed)
Summary: CLASS/SB.   Allergies: No Known Drug Allergies   Complete Medication List: 1)  Novolin 70/30 70-30 % Susp (Insulin isophane & regular) .... Inject 45 units subcutaneously each am and 45  units subcutaneously each pm 2)  Glucophage 500 Mg Tabs (Metformin hcl) .... Take one tablet twice a day for one week.  then take two tablets twice a day and continue. 3)  Lisinopril 5 Mg Tabs (Lisinopril) .... Take one tablet daily.  Other Orders: DSMT(Medicare) Individual, 30 Minutes (G0108)  Patient Instructions: 1)  Based on insulin dosies of 45 units 30 minutes before brekafast and 42 units before dinner, you need about the following amounts of carbohydrates to control your blood sugar  2)  Breakfast:  ~ 80 grams 3)  snack:15-30 grams 4)  Lunch :  ~50 grams 5)  snack: 15-30 grams 6)  Dinner: ~ 75 grams 7)  Bedtime snack : 15-30 grams 8)  For a total of about 300 grams carbohydrate/day 9)  call me anytime for questions  Diabetes Self Management Training  PCP: Bertha Stakes MD Date diagnosed with diabetes: 01/09/2002 Diabetes Type: Type 1 Current smoking Status: never  Assessment Work Hours: Full 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 he was assertive in setting limits for not changing his insulin regimen at this time, asking for a meter, but not wanting to apply for financial assistance for strips. Seemed to absorb information about insulin types, action, onset. peak, duration and relationship of insulin to food. Eats light breakfast and moderatelunch and dinner-reports he has been staying away form sweets lately and has been dropping at night. may wish to consider  ketone testign at some point and glucagon prescription.   Mixed/Intermediate   Insulin Type:Novolin 70/30 Breakfast Dose: 60  Dinner Dose:45    Monitoring Self monitoring blood glucose 4 times a day Name of Meter  True Track Measures urine ketones? No  Time of Testing  Before meals  After meals  Recent Episodes of: Requiring Help from another person  Hyperglycemia : Yes Hypoglycemia: Yes Severe Hypoglycemia : No     Estimated /Usual Carb Intake Breakfast # of Carbs/Grams chicken biscuit Midafternoon # of Carbs/Grams likes pretzels but has been eatinglow salt ones for past two weeks Dinner # of Carbs/Grams canned veggies that have been rinsed,  Bedtime # of Carbs/Grams fruit  Nutrition assessment Biggest challenge to eating healthy: Eating too much and not understanding insulin food relationship  Activity Limitations  Appropriate physical activity Diabetes Disease Process  Discussed today State own type of diabetes: Demonstrates competencyState diabetes is treated by meal plan-exercise-medication-monitoring-education: Demonstrates competency Medications State name-action-dose-duration-side effects-and time to take medication: Needs review/assistance   State appropriate timing of food related to medication: Needs review/assistance    Nutritional Management Identify what foods most often affect blood glucose: Needs review/assistance    Verbalize importance of controlling food portions: Demonstrates competency   State importance of spacing and not omitting meals and snacks: Needs review/assistance       Monitoring State purpose and frequency of monitoring BG-ketones-HgbA1C  : Needs review/assistance   Perform glucose monitoring/ketone testing and record results correctly: Demonstrates competencyState target blood glucose and HgbA1C goals: Demonstrates competency Complications State the causes-signs and symptoms and prevention  of Hyperglycemia: Needs  review/assistance   Explain proper treatment of hyperglycemia: Needs review/assistance   State the causes- signs and symptoms and prevention of hypoglycemia: Needs review/assistance   Explain proper treatment of hypoglycemia: Needs review/assistance   State benefits-risks-and options for improving blood sugar control: Needs review/assistance    Exercise States importance of exercise: Demonstrates competencyStates effect of exercise on blood glucose: Demonstrates competencyDiabetes Management Education Done: 01/14/2009    BEHAVIORAL GOALS INITIAL Incorporating appropriate nutritional management: try to keep carbs consistent as discussed to control your blood sugar and prevent lows        Diabetes Self Management Support: clinic staff Follow-up:2-4 weeks

## 2010-02-08 NOTE — Assessment & Plan Note (Signed)
Summary: ACUTE/JOINES/F/U VISIT/CH   Vital Signs:  Patient profile:   35 year old male Height:      73 inches (185.42 cm) Weight:      335.3 pounds (150.05 kg) BMI:     43.71 Temp:     98.5 degrees F (36.94 degrees C) oral Pulse rate:   82 / minute BP sitting:   115 / 74  (left arm) Cuff size:   X LARGE  Vitals Entered By: Lucky Rathke NT II (May 24, 2009 2:40 PM) CC: PATIENT IS HERE ONLY FOR FOLLOW UP OF I & D OF NECK / CULTURE RESULTS Is Patient Diabetic? Yes Did you bring your meter with you today? No Pain Assessment Patient in pain? yes     Location: BACK OF NECK Intensity:          4 Type: SHARP / STING Onset of pain  ABOUT 1-2 WEEKS AGO Nutritional Status BMI of > 30 = obese  Have you ever been in a relationship where you felt threatened, hurt or afraid?No   Does patient need assistance? Functional Status Self care Ambulation Normal Comments PATIENT IS HERE FOR FOLLOW UP ON   I& D OF BACK OF NECK .  CULTURE RESULTS   Primary Care Provider:  Bertha Stakes MD  CC:  PATIENT IS HERE ONLY FOR FOLLOW UP OF I & D OF NECK / CULTURE RESULTS.  History of Present Illness: 38 r old with PMH as mentioned in the EMR comes to the office for a follow up on his I&D that was done last week.  He reports that he has been doing dressing changes every day and reports compliance to his abx. He reports that his pain is well controlled on percocet.  He states that his pain is far well controlled now. He reports that his cbgs are also improving and his cbgs have all been running in 200's to 250 range.   He denies any other complaints.     Preventive Screening-Counseling & Management  Alcohol-Tobacco     Alcohol type: AT TIMES     Smoking Status: never  Caffeine-Diet-Exercise     Does Patient Exercise: yes     Type of exercise: Treadmill, bike, free weights.     Times/week: 4-5  Problems Prior to Update: 1)  Abscess, Neck  (ICD-682.1) 2)  Edema  (ICD-782.3) 3)  Scabies   (ICD-133.0) 4)  Proteinuria, Mild  (ICD-791.0) 5)  Sexual Activity, High Risk  (ICD-V69.2) 6)  Morbid Obesity  (ICD-278.01) 7)  Nausea  (ICD-787.02) 8)  Diabetes Mellitus, Type I  (ICD-250.01)  Medications Prior to Update: 1)  Novolin 70/30 70-30 %  Susp (Insulin Isophane & Regular) .... Inject 45 Units Subcutaneously Each Am and 40 Units Subcutaneously Each Pm. 2)  Glucophage 500 Mg Tabs (Metformin Hcl) .... Take 2 Tablets By Mouth Two Times A Day 3)  Truetrack Test  Strp (Glucose Blood) .... Use To Test Blood Sugar 4-6 Times A Day- Suggest At Least Before Each Meal and Bedtime 4)  Lancets 30g  Misc (Lancets) .... Use To Check Blood Sugar 4-6 Times Daily 5)  Bactrim Ds 800-160 Mg Tabs (Sulfamethoxazole-Trimethoprim) .... Take 1 Tablet By Mouth Two Times A Day For 14 6)  Oxycodone-Acetaminophen 5-325 Mg Tabs (Oxycodone-Acetaminophen) .... Take 1-2 Tablets By Mouth Three Times A Day As Needed For Pain  Current Medications (verified): 1)  Novolin 70/30 70-30 %  Susp (Insulin Isophane & Regular) .... Inject 45 Units Subcutaneously Each Am and 40  Units Subcutaneously Each Pm. 2)  Glucophage 500 Mg Tabs (Metformin Hcl) .... Take 2 Tablets By Mouth Two Times A Day 3)  Truetrack Test  Strp (Glucose Blood) .... Use To Test Blood Sugar 4-6 Times A Day- Suggest At Least Before Each Meal and Bedtime 4)  Lancets 30g  Misc (Lancets) .... Use To Check Blood Sugar 4-6 Times Daily 5)  Bactrim Ds 800-160 Mg Tabs (Sulfamethoxazole-Trimethoprim) .... Take 1 Tablet By Mouth Two Times A Day For 14 6)  Oxycodone-Acetaminophen 5-325 Mg Tabs (Oxycodone-Acetaminophen) .... Take 1-2 Tablets By Mouth Three Times A Day As Needed For Pain  Allergies (verified): No Known Drug Allergies  Past History:  Family History: Last updated: 05/08/2007 AODM in mother CHF aunt HTN several relatives No cancer  Social History: Last updated: 05/21/2009 Finishing school at [employer] in Dec, plans to transfer to 4 year  school. 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  Risk Factors: Exercise: yes (05/24/2009)  Past Medical History: Reviewed history from 10/26/2005 and no changes required. Diabetes mellitus, type I  Family History: Reviewed history from 05/08/2007 and no changes required. AODM in mother CHF aunt HTN several relatives No cancer  Social History: Reviewed history from 05/21/2009 and no changes required. Finishing school at [employer] in Dec, plans to transfer to 4 year school. 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  Review of Systems      See HPI  Physical Exam  General:  alert and well-developed.   Head:  normocephalic and atraumatic.   Neck:  The I&D site looks well healing. Margins look very healthy with no pus or erythema. Lungs:  normal respiratory effort, no intercostal retractions, no accessory muscle use, and normal breath sounds.   Heart:  normal rate, regular rhythm, and no murmur.  normal rate, regular rhythm, and no murmur.  normal rate, regular rhythm, and no murmur.   Pulses:  R radial normal.   Extremities:  3+ edema to knee bilaterally.   Impression & Recommendations:  Problem # 1:  ABSCESS, NECK (ICD-682.1) s/p I &D. Healing well. Dressing changes done. Recommended to finish the antibiotics. Will continue dressing changes once daily and will follow up in 2 weeks.   His updated medication list for this problem includes:    Bactrim Ds 800-160 Mg Tabs (Sulfamethoxazole-trimethoprim) .Marland Kitchen... Take 1 tablet by mouth two times a day for 14  Problem # 2:  DIABETES MELLITUS, TYPE I (ICD-250.01)  Uncontrolled. HbA1C trending down although still very high.Metformin increased to maximum dose at his last visit with me and he reports that CBG's have tremendously improved. Recommended to check his CBG's 2-3 times daily. Will continue current regimen and will follow up  in 2 weeks.  His updated medication list for this problem includes:    Novolin 70/30 70-30 % Susp (Insulin isophane & regular) ..... Inject 45 units subcutaneously each am and 40 units subcutaneously each pm.    Glucophage 500 Mg Tabs (Metformin hcl) .Marland Kitchen... Take 2 tablets by mouth two times a day  Labs Reviewed: Creat: 0.85 (01/14/2009)    Reviewed HgBA1c results: 11.3 (05/21/2009)  13.5 (12/28/2008)  Complete Medication List: 1)  Novolin 70/30 70-30 % Susp (Insulin isophane & regular) .... Inject 45 units subcutaneously each am and 40 units subcutaneously each pm. 2)  Glucophage 500 Mg Tabs (Metformin hcl) .... Take 2 tablets by mouth two times a day 3)  Truetrack Test  Strp (Glucose blood) .... Use to test blood sugar 4-6 times a day- suggest at least before each meal and bedtime 4)  Lancets 30g Misc (Lancets) .... Use to check blood sugar 4-6 times daily 5)  Bactrim Ds 800-160 Mg Tabs (Sulfamethoxazole-trimethoprim) .... Take 1 tablet by mouth two times a day for 14 6)  Oxycodone-acetaminophen 5-325 Mg Tabs (Oxycodone-acetaminophen) .... Take 1-2 tablets by mouth three times a day as needed for pain  Patient Instructions: 1)  Please schedule a follow-up appointment in 2 weeks. 2)  Finish the antibiotic treatment as advised.  Prevention & Chronic Care Immunizations   Influenza vaccine: Fluvax Non-MCR  (12/28/2008)    Tetanus booster: Not documented    Pneumococcal vaccine: Not documented  Other Screening   Smoking status: never  (05/24/2009)  Diabetes Mellitus   HgbA1C: 11.3  (05/21/2009)    Eye exam: Not documented    Foot exam: yes  (01/14/2009)   High risk foot: No  (05/08/2007)   Foot care education: Done  (05/08/2007)   Foot exam due: 05/07/2008    Urine microalbumin/creatinine ratio: 80.5  (12/28/2008)   Urine microalbumin action/deferral: Ordered  Self-Management Support :   Personal Goals (by the next clinic visit) :     Personal A1C goal: 7  (05/21/2009)      Personal blood pressure goal: 130/80  (05/21/2009)     Personal LDL goal: 100  (05/21/2009)    Patient will work on the following items until the next clinic visit to reach self-care goals:     Medications and monitoring: take my medicines every day, check my blood sugar, bring all of my medications to every visit, examine my feet every day  (05/24/2009)     Eating: drink diet soda or water instead of juice or soda, eat more vegetables, use fresh or frozen vegetables, eat foods that are low in salt, eat baked foods instead of fried foods, eat fruit for snacks and desserts, limit or avoid alcohol  (05/24/2009)     Activity: take a 30 minute walk every day  (05/24/2009)    Diabetes self-management support: Resources for patients handout  (05/21/2009)   Last diabetes self-management training by diabetes educator: 01/14/2009

## 2010-02-08 NOTE — Progress Notes (Signed)
Summary: refill/ hkla  Phone Note Refill Request Message from:  Patient on August 31, 2009 2:39 PM  Refills Requested: Medication #1:  WARFARIN SODIUM 7.5 MG TABS Take 1 tablet by mouth once a day.   Dosage confirmed as above?Dosage Confirmed   Last Refilled: 7/21 last coumadin visit 8/1, next visit 8/24  Initial call taken by: Freddy Finner RN,  August 31, 2009 2:39 PM  Follow-up for Phone Call        Patient missed his last appointment in the anticoagulation clinic on August 10.  He has an appointment for tomorrow August 24.  He should keep that appointment and his warfarin can be appropriately prescribed based upon his INR.  I called patient and he stated that he would keep his appointment tomorrow. Follow-up by: Bertha Stakes MD,  August 31, 2009 4:37 PM

## 2010-02-28 ENCOUNTER — Ambulatory Visit: Payer: Self-pay | Admitting: Internal Medicine

## 2010-02-28 ENCOUNTER — Encounter: Payer: Self-pay | Admitting: Ophthalmology

## 2010-03-11 ENCOUNTER — Other Ambulatory Visit: Payer: Self-pay | Admitting: *Deleted

## 2010-03-11 MED ORDER — INSULIN NPH ISOPHANE & REGULAR (70-30) 100 UNIT/ML ~~LOC~~ SUSP: SUBCUTANEOUS | Status: DC

## 2010-03-11 MED ORDER — METFORMIN HCL 500 MG PO TABS: 1000.0000 mg | ORAL_TABLET | Freq: Two times a day (BID) | ORAL | Status: DC

## 2010-03-23 ENCOUNTER — Ambulatory Visit: Payer: Self-pay | Admitting: Internal Medicine

## 2010-03-26 LAB — BASIC METABOLIC PANEL
CO2: 24 mEq/L (ref 19–32)
Calcium: 8.6 mg/dL (ref 8.4–10.5)
Chloride: 103 mEq/L (ref 96–112)
GFR calc Af Amer: >60 mL/min (ref >60)
Glucose, Bld: 149 mg/dL — ABNORMAL HIGH (ref 70–99)
Potassium: 4.5 mEq/L (ref 3.5–5.1)
Sodium: 133 mEq/L — ABNORMAL LOW (ref 135–145)

## 2010-03-26 LAB — PROTEIN S, TOTAL: Protein S Ag, Total: 122 % (ref 70–140)

## 2010-03-26 LAB — APTT: aPTT: 27 seconds (ref 24–37)

## 2010-03-26 LAB — PROTHROMBIN GENE MUTATION

## 2010-03-26 LAB — COMPREHENSIVE METABOLIC PANEL
ALT: 31 U/L (ref 0–53)
AST: 31 U/L (ref 0–37)
Albumin: 3.5 g/dL (ref 3.5–5.2)
Alkaline Phosphatase: 58 U/L (ref 39–117)
BUN: 3 mg/dL — ABNORMAL LOW (ref 6–23)
CO2: 27 mEq/L (ref 19–32)
Chloride: 103 mEq/L (ref 96–112)
GFR calc Af Amer: >60 mL/min (ref >60)
GFR calc non Af Amer: >60 mL/min (ref >60)
Potassium: 2.7 mEq/L — CL (ref 3.5–5.1)
Sodium: 139 mEq/L (ref 135–145)
Total Bilirubin: 1.2 mg/dL (ref 0.3–1.2)

## 2010-03-26 LAB — CBC
Hemoglobin: 13.5 g/dL (ref 13.0–17.0)
Hemoglobin: 13.6 g/dL (ref 13.0–17.0)
MCH: 30 pg (ref 26.0–34.0)
MCH: 30.1 pg (ref 26.0–34.0)
MCHC: 33.7 g/dL (ref 30.0–36.0)
Platelets: 188 10*3/uL (ref 150–400)
Platelets: 199 10*3/uL (ref 150–400)
RBC: 4.49 MIL/uL (ref 4.22–5.81)
RBC: 4.53 MIL/uL (ref 4.22–5.81)
RDW: 13.8 % (ref 11.5–15.5)
RDW: 13.9 % (ref 11.5–15.5)
WBC: 8.4 10*3/uL (ref 4.0–10.5)

## 2010-03-26 LAB — LUPUS ANTICOAGULANT PANEL
DRVVT: 45.1 secs — ABNORMAL HIGH (ref 36.2–44.3)
PTT Lupus Anticoagulant: 49.9 secs — ABNORMAL HIGH (ref 30.0–45.6)
PTTLA 4:1 Mix: 44.7 secs (ref 30.0–45.6)
dRVVT Incubated 1:1 Mix: 41 secs (ref 36.2–44.3)

## 2010-03-26 LAB — ANTITHROMBIN III: AntiThromb III Func: 100 % (ref 76–126)

## 2010-03-26 LAB — BETA-2-GLYCOPROTEIN I ABS, IGG/M/A
Beta-2-Glycoprotein I IgA: 9 A Units (ref <20)
Beta-2-Glycoprotein I IgM: 0 M Units (ref <20)

## 2010-03-26 LAB — GLUCOSE, CAPILLARY
Glucose-Capillary: 123 mg/dL — ABNORMAL HIGH (ref 70–99)
Glucose-Capillary: 154 mg/dL — ABNORMAL HIGH (ref 70–99)

## 2010-03-26 LAB — PROTEIN C, TOTAL: Protein C, Total: 116 % (ref 70–140)

## 2010-03-26 LAB — PROTIME-INR
INR: 0.93 (ref 0.00–1.49)
Prothrombin Time: 12.4 seconds (ref 11.6–15.2)
Prothrombin Time: 12.5 seconds (ref 11.6–15.2)

## 2010-03-26 LAB — CARDIOLIPIN ANTIBODIES, IGG, IGM, IGA
Anticardiolipin IgG: 6 GPL U/mL — ABNORMAL LOW (ref <23)
Anticardiolipin IgM: 5 MPL U/mL — ABNORMAL LOW (ref <11)

## 2010-03-27 LAB — BASIC METABOLIC PANEL
BUN: 7 mg/dL (ref 6–23)
BUN: 7 mg/dL (ref 6–23)
CO2: 24 mEq/L (ref 19–32)
CO2: 24 mEq/L (ref 19–32)
CO2: 24 mEq/L (ref 19–32)
CO2: 24 mEq/L (ref 19–32)
Calcium: 9.7 mg/dL (ref 8.4–10.5)
Chloride: 103 mEq/L (ref 96–112)
Chloride: 103 mEq/L (ref 96–112)
Chloride: 104 mEq/L (ref 96–112)
Chloride: 105 mEq/L (ref 96–112)
Creatinine, Ser: 0.64 mg/dL (ref 0.4–1.5)
Creatinine, Ser: 0.71 mg/dL (ref 0.4–1.5)
Creatinine, Ser: 0.74 mg/dL (ref 0.4–1.5)
Creatinine, Ser: 0.85 mg/dL (ref 0.4–1.5)
Creatinine, Ser: 1 mg/dL (ref 0.4–1.5)
GFR calc Af Amer: >60 mL/min (ref >60)
GFR calc Af Amer: >60 mL/min (ref >60)
GFR calc Af Amer: >60 mL/min (ref >60)
GFR calc Af Amer: >60 mL/min (ref >60)
GFR calc non Af Amer: >60 mL/min (ref >60)
Glucose, Bld: 206 mg/dL — ABNORMAL HIGH (ref 70–99)
Potassium: 3.1 mEq/L — ABNORMAL LOW (ref 3.5–5.1)
Potassium: 3.1 mEq/L — ABNORMAL LOW (ref 3.5–5.1)
Potassium: 3.5 mEq/L (ref 3.5–5.1)
Potassium: 3.8 mEq/L (ref 3.5–5.1)

## 2010-03-27 LAB — GLUCOSE, CAPILLARY
Glucose-Capillary: 100 mg/dL — ABNORMAL HIGH (ref 70–99)
Glucose-Capillary: 155 mg/dL — ABNORMAL HIGH (ref 70–99)
Glucose-Capillary: 199 mg/dL — ABNORMAL HIGH (ref 70–99)
Glucose-Capillary: 271 mg/dL — ABNORMAL HIGH (ref 70–99)
Glucose-Capillary: 95 mg/dL (ref 70–99)
Glucose-Capillary: 119 mg/dL — ABNORMAL HIGH (ref 70–99)
Glucose-Capillary: 81 mg/dL (ref 70–99)

## 2010-03-27 LAB — MRSA PCR SCREENING: MRSA by PCR: NEGATIVE

## 2010-03-29 LAB — GLUCOSE, CAPILLARY: Glucose-Capillary: 550 mg/dL — ABNORMAL HIGH (ref 70–99)

## 2010-05-27 NOTE — Discharge Summary (Signed)
NAMEDAIRO, HEMRIC NO.:  [id number]   MEDICAL RECORD NO.:  [id number]                   PATIENT TYPE:  INP   LOCATION:  5709                                 FACILITY:  [facility]   PHYSICIAN:  Evette Doffing, M.D.               DATE OF BIRTH:  [birth year]   DATE OF ADMISSION:  10/01/2002  DATE OF DISCHARGE:  10/07/2002                                 DISCHARGE SUMMARY   DISCHARGE DIAGNOSES:  1. Pulmonary embolism,  VQ scan positive on October 01, 2002.  2. Deep venous thrombosis, Doppler positive October 03, 2002.  3. Diabetes mellitus type 1, Diabetic ketoacidosis September 23, 2002.  4. Obesity.  5. Clostridium difficile colitis October 03, 2002 positive.   DISCHARGE MEDICATIONS:  1. Novolin 70/30, 52 units A.M., 28 units P.M.  2. Coumadin 12.5 mg p.o. 6 P.M. every day until Thursday.  3. Lovenox 160 mg p.o. q.12h. for 4 to 7 days.  4. Metronidazole 500 mg t.i.d. until October 13, 2002.  Stop on October 14, 2002.   DISPOSITION:  The patient was discharged home.  A follow up visit was  arranged for October 16, 2002 at 3:00 P.M.  Appointment for Coumadin clinic  was arranged on October 09, 2002 at  9 A.M.   PROCEDURES:  Echocardiography, VQ scan, chest x-ray, abdominal x-ray,  electrocardiogram.   HISTORY OF PRESENT ILLNESS:  The patient is a 35 year old African-American  male with past medical history significant for DKA, diabetes mellitus type  1, and recent hospitalization who presented to the emergency department with  a few hours history of shortness of breath, chest pain, substernal, 10 out  of 10, no radiation associated with this but palpitations and shortness of  breath.  The pain was abrupt and severe.  He passed out without losing his  consciousness.  He was brought by EMS to emergency department for  evaluation.   PHYSICAL EXAMINATION:  VITAL SIGNS:  Pulse 126, blood pressure 106/57,  temperature 98.3, respiratory rate  18, oxygen saturation 85% on room air.  GENERAL:  No acute distress, tachypnea on minimal exertion.  HEENT:  Eyes pupils equal, round, reactive to light and accommodation.  Extraocular movements intact.  ENT:  Normocephalic, atraumatic.  NECK:  Supple.  No thyromegaly.  No carotid bruits.  RESPIRATORY:  At rest breathing unlabored.  Breath sounds present  bilaterally.  Some crackles at the base bilaterally.  CARDIOVASCULAR:  PMI not displaced.  Tachycardia, _________.  GI:  Abdomen soft, nontender.  Breath sounds present.  EXTREMITIES:  No edema.  Left calf 43 cm.  Right calf 47 cm.  Homan's sign  is absent.  SKIN:  Dry.  LYMPH NODES:  No lymphadenopathy.  NEUROLOGICAL:  Grossly intact.  Alert, oriented X3.   ADMISSION LABORATORY DATA:  On admission the D-dimer is 18.78.  White blood  cell count 8.1, red blood  cells 477, hemoglobin 14.2, hematocrit 40.9, MCV  85.6, RDW 13.4, platelet count 179,000.  ABGs with pH 7.539, pCO2 26.8, pO2  52.0, bicarb 25.  Sodium 135, potassium 3.3, chloride 101, cO2 25, glucose  307, BUN 6, creatinine 0.9.  Calcium 9, total protein 6.7, albumin 3.2, AST  138, ALT 135, ALP 50.  Total bilirubin 1.7.   HOSPITAL COURSE:  PROBLEM #1:  PULMONARY EMBOLUS, DEEP VENOUS THROMBOSIS:  The patient was admitted with chest pain, palpitations and dyspnea.  The  patient has had risk factors for development of pulmonary embolus such as  obesity, recent hospitalization and immobilization and recent history of  DKA.  The patient's diagnosis was verified by positive D-dimer and VQ scan  which revealed multiple bilateral segmental ventilation perfusions mismatch  with high probability for pulmonary emboli.  The patient was started on  Lovenox and Coumadin.  He was discharged with INR 1.3 and Pro time 15.4 on  Coumadin dose 12.5 mg.  The patient was arranged for follow up visit to the  Coumadin clinic on October 09, 2002.  Deep venous thrombosis was verified  by Doppler and  treatment was Coumadin and Lovenox.   PROBLEM #2:  DIABETES MELLITUS:  Diabetes was stable on this admission.  The  patient was discharged on Novolin 70/30 and his dose was 52 units in the  A.M. and 28 units in P.M.   PROBLEM #3:  CLOSTRIDIUM DIFFICILE COLITIS:  On the second day of admission  the patient started having diarrhea.  Clostridium difficile culture was  positive.  He was started on Metronidazole 500 mg t.i.d. and is continuing  this dose until October 13, 2002.   DISCHARGE LABORATORY DATA:  Discharge labs show white blood cell count 5.8,  red blood cells 4.31, hemoglobin 12.8, hematocrit 37.5, MCV 86.1, RDW 14.3,  platelet count 253,000, neutrophils 52, lymphocytes 34, monocytes 12,  eosinophils 2, basophils 0.  ProTime on discharge 15.4, INR 1.3.  Sodium  131, potassium 3.4, chloride 106, cO2 25, glucose 117, BUN 2, creatinine  0.8, calcium 8.6.  His B12 is 364, red blood cell folate 455 and Ferritin  674.      Salome Holmes, MD                           Evette Doffing, M.D.    Thea Gist  D:  10/16/2002  T:  10/17/2002  Job:  [id number]

## 2010-05-27 NOTE — Op Note (Signed)
[name], [name]   MEDICAL RECORD NO.:  [id number]          PATIENT TYPE:  INP   LOCATION:  3309                         FACILITY:  [facility]   PHYSICIAN:  Georgina Quint, M.D.   DATE OF BIRTH:  July 03, 1975   DATE OF PROCEDURE:  09/19/2005  DATE OF DISCHARGE:                                 OPERATIVE REPORT   PREOPERATIVE DIAGNOSIS:  Abscess on the back.   POSTOPERATIVE DIAGNOSIS:  Abscess on the back.   PROCEDURE:  I&D of abscess.   SURGEON:  Bowman   ANESTHESIA:  Local.   SPECIMEN:  Culture.   COMPLICATIONS:  None.   BLOOD LOSS:  Minimal.   PROCEDURE:  After the patient was prepped and draped at the bedside, I  infiltrated local anesthetic liberally at the site of small open area on a  large area of induration and tenderness on the back, just to the left of the  midline, just below the scapula.  The patient tolerated local anesthetic  well.  I made about a 2-cm incision on the abscess and dissected down  through the dense and somewhat scarred subcutaneous tissues until I fell  into a very large abscess cavity with copious pus draining.  I took a  culture.  I used a hemostat to open the area widely.  Hemostasis was  adequate.  I packed the cavity with opened 4x4's stuffed into the cavity and  then applied a bulky bandage over that.  I taped that in place.  The patient  tolerated it well.      Georgina Quint, M.D.  Electronically Signed     WB/MEDQ  D:  09/19/2005  T:  09/20/2005  Job:  [id number]

## 2010-05-27 NOTE — Discharge Summary (Signed)
[facility]  Patient:    [name], [name]                        MRN: [id number] Adm. Date:  12/20/99 Disc. Date: 12/27/99 Attending:  Gloris Ham, M.D. [facility]                           Discharge Summary  DISCHARGE DIAGNOSES: 1. Diabetes, type not determined. 2. Hypertension. 3. Dyslipidemia. 4. Epigastric pain, cardiac etiology excluded, better with Protonix. 5. Acidosis, probably due to diabetes, resolved.  REASON FOR ADMISSION:  Vomiting.  HISTORY OF PRESENT ILLNESS:  The patient is a 35 year old man admitted by Dr. Linda Hedges on December 20, 1999, with diabetes with severe hyperglycemia and mild metabolic acidosis.  Please refer to his dictated history and physical for details.  HOSPITAL COURSE:  The patient was admitted and treated with IV fluids and insulin.  His insulin was adjusted, but he will need ongoing such adjustments. Because the type of diabetes could not be determined with certainty (type 1, type 2, or atypical diabetes), he will need therapy with mealtime insulin, basal insulin, and insulin sensitizer.  The patient was seen in consultation by the diabetes education service and learned how to inject insulin, and he is also going for follow-up diet class.  His hypertension was well controlled with Vasotec.  He was found to have an elevated LDL cholesterol, and this was treated with Lipitor.  This will need to be rechecked on follow-up.  The patient developed epigastric pain which was ultimately responsive to Protonix.  An electrocardiogram showed the possibility of Q-waves inferiorly. He underwent a treadmill Cardiolite on December 21, 1999, which did not show cardiac ischemia.  On December 27, 1999, the patient was alert, oriented, ambulatory, and eating his usual diet.  Although his glucoses were in the 200s, this will need further adjustment as an outpatient.  Specifically, he states he eats only two meals a day, thus, is a  stronger case for mealtime insulin.  MEDICATIONS: 1. Lantus 15 units q.h.s. 2. Vasotec 20 mg twice a day. 3. Humalog 35 units with each meal. 4. Lipitor 20 mg daily. 5. Actos 45 mg daily. 6. Protonix 40 mg daily.  ACTIVITY:  No restrictions.  DIET:  Diabetic diet as advised during hospitalization.  SPECIAL INSTRUCTIONS:  Check blood sugar daily, varying the time of day between a.c. and h.s.  FOLLOW-UP:  Will be at [facility] within two weeks.  The patient is advised to call for appointment. DD:  12/27/99 TD:  12/27/99 Job: [id number]

## 2010-05-27 NOTE — H&P (Signed)
[facility]  Patient:    [name], [name]                        MRN: [id number] Adm. Date:  12/20/99 Attending:  Heinz Knuckles. Norins, M.D. [facility]                         History and Physical  CHIEF COMPLAINT:  Fatigue, nausea, vomiting and urinary frequency.  HISTORY OF PRESENT ILLNESS:  Mr. Hergenrader is a 35 year old single black male with no prior history of diabetes but a strong family history for the same. The patient reports a 7-10 history of increased urinary frequency and dry mouth. The patient developed nausea and vomiting on December 20, 1999. He reports no visual change. He has had decreased appetite with the nausea. He has had no fever, no sweats, no chills, no paresthesias or no other significant symptoms. In the emergency room, he was evaluated and found to have a serum glucose of greater than 500. He was given 2 liters of IV fluids. He was given insulin in the ER and he is now admitted with new onset diabetes with early DKA.  PAST MEDICAL HISTORY:  Chickenpox as a child. No STDs and no serious illnesses.  PAST SURGICAL HISTORY:  None.  TRAUMA:  The patient had a sprained ankle in December of 2000.  HABITS:  The patient does not smoke. He drinks very rarely. He has no recreational drug use.  ALLERGIES:  No known drug allergies.  FAMILY HISTORY:  Mother, maternal grandmother, aunts and uncles with diabetes. No history of heart disease, colon cancer of prostate cancer.  SOCIAL HISTORY:  The patient is a high school graduate and did play football. 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 The patient lives with his mother and he has 3 brothers at home, one sister is independent. His father lives in [place]. The patient works at [employer] in [place].  PHYSICAL EXAMINATION:  VITAL SIGNS:  Temperature was 96.9, blood pressure 133/78. Heart rate 109, respirations 16, weight 350 pounds. CBG on the  floor 21:30 hours was 340.  GENERAL:  This is an obese black male in no acute distress.  HEENT:  Normocephalic, atraumatic. Oropharynx was dry but no reasons were noted. Conjunctivae and sclerae were clear. Ophthalmologic exam deferred with no working ophthalmoscope on the floor.  NECK:  Supple with full range of motion and easily palpable thyroid gland without nodules.  NODES:  No adenopathy noted in the cervical or supraclavicular regions.  CHEST:  No CVA tenderness.  LUNGS:  Clear to auscultation and percussion.  CARDIOVASCULAR:  2+ radial pulse. He had a regular rate and rhythm without murmurs, rubs or gallops.  ABDOMEN:  Obese with positive bowel sounds in all 4 quadrants. He had no guarding or rebound. There was no tenderness.  RECTAL/GENITALIA:  Deferred.  EXTREMITIES:  Without deformity. He moves all extremities to command.  NEUROLOGIC:  Alert and oriented x 4. Cranial nerves II-XII are grossly intact. Sensation was well preserved to light touch.  DERM:  The patient has a chronic dermatitis of the scalp with a nodular appearance at the hairline.  LABORATORY DATA:  Hemoglobin 16.9, hematocrit 47.9, white count was 9900 with 74% segs, 18%, lymphs, 4% monos, 314,000 platelets. Chemistries revealed sodium 134, potassium 4.7, chloride 94, CO2 was 16. BUN 22, creatinine 1.8, glucose 684, total bilirubin 1.7, SGOT 47, SGPT 77.  Amylase was 39. Urinalysis was significant for 0-5 rbcs, small ketones, trace of yeast.  ASSESSMENT:  Diabetes. A patient with new onset diabetes with a strong family history of the same. He presents with minimal acidosis.  PLAN:  The patient is continued on IV fluids at 200 cc an hour which will be dropped back to 150 cc an hour after the next liter. The patient will be followed with sliding scale every 1 hour x 4 hours going to every 2 hours x 4 hours and then a.c. and h.s. Starting in the a.m., the patient will be started on oral hypoglycemia  with Glucotrol XL 5 mg daily. Will obtain a follow-up laboratory to monitor dehydration, acidosis, and potassium. Will also recheck a lipid profile.  Anticipated short hospital stay given the patients minimal acidosis. He will need to have outpatient education at the diabetic teaching center. He will need to have outpatient follow-up with the physician of choice. DD:  12/20/99 TD:  12/20/99 Job: [id number]

## 2010-05-27 NOTE — Discharge Summary (Signed)
[name], [name] NO.:  [id number]   MEDICAL RECORD NO.:  [id number]          PATIENT TYPE:  INP   LOCATION:  3003                         FACILITY:  [facility]   PHYSICIAN:  Evette Doffing, M.D.  DATE OF BIRTH:  03-May-1975   DATE OF ADMISSION:  09/18/2005  DATE OF DISCHARGE:  09/21/2005                                 DISCHARGE SUMMARY   DISCHARGE DIAGNOSES:  1. Diabetic ketoacidosis.  2. Back abscess, s/p I and D.  3. Diabetes mellitus requiring insulin.  4. History of deep vein thrombosis in July 2006 status post Coumadin x8      months.  5. History of hypertension, controlled with weight loss.   DISCHARGE MEDICATIONS:  1. Insulin 70/30 35 units in the morning and 35 units at night before      dinner.  2. Doxycycline 100 mg p.o. b.i.d. x14 days.   DISPOSITION AND FOLLOWUP:  [name] is being discharged from the hospital  in good condition.  He is to follow up with Dr. Deon Pilling at [facility] on Tuesday, September 26, 2005 at 1:15 p.m.  Issues to be addressed  at that time include resolution of his abscess that was incised and drained  while in hospital and treated with IV antibiotics as well as oral  antibiotics as an outpatient.  He was then to follow up with Dr. Frederik Schmidt at the [facility] on October 26, 2005 at 2:30 p.m.  Issues to be addressed at that time  include diabetes control, referral to [facility], hemoglobin A1c, as well  as a CBG will be obtained prior to the patient's clinic visit.  Dr.  Frederik Schmidt can adjust patient's insulin regimen at that time based on those  lab findings.   PROCEDURES PERFORMED:  The patient had an incision and drainage performed on  the night of admission by the ED physicians.  He had repeat incision and  drainage performed by Dr. Deon Pilling, performed with local anesthesia.  He  tolerated this procedure well with no complications.   CONSULTATIONS:  Dr. Deon Pilling of [facility].   BRIEF ADMISSION HISTORY AND PHYSICAL:  [name] is a 35 year old African  American male with type 1 diabetes mellitus who presents with a boil on his  back that he noticed approximately 1 week ago.  The day prior to admission,  the patient found his morning preprandial CBG to be very elevated at 437.  Patient felt that he normally controlled it well with blood sugars ranging  from 60 to 130.  He had also been complaining of nausea and vomiting, he had  vomited once the morning prior to admission.  He reports decreased appetite  for 2 to 3 days as well as chills and supraumbilical and abdominal pain.  He  also reported increased urinary frequency and lightheadedness when standing  for the past several days.  He had also had some blurry vision.  He became  concerned about the cyst on his back when it became painful while lifting  weights.  He denies any other noticeable discharge  from it.   PHYSICAL EXAMINATION:  VITAL SIGNS:  Temperature 98.8, blood pressure  125/73, pulse 97, respiratory rate 20, O2 sat is 99% on room air.  GENERAL:  This is a large man lying on his stomach in no apparent distress.  EYES:  Entirely normal.  RESPIRATORY:  Clear to auscultation bilaterally.  No wheezes, rhonchi, or  rales.  CARDIOVASCULAR:  Regular rate and rhythm.  No murmur, rubs, or gallops.  ABDOMEN:  Positive bowel sounds.  Soft.  There was mild periumbilical  tenderness.  EXTREMITIES:  No cyanosis, clubbing, or edema.  SKIN:  He had redundant, loose abdominal skin.  On his back, there was an approximately 7 to 8 cm indurated area with a  small, approximately 1/2 cm incision with purulent serosanguineous  discharge.  He is quite tender to palpation over this.  There is surrounding  erythema, again, outside of the induration.  MUSCULOSKELETAL:  He is moving upper and lower extremities bilaterally with  no difficulty.  NEUROLOGIC:  Cranial nerves grossly intact.  No focal deficits.   PSYCH:  Alert and oriented.  He had appropriate affect.   ADMISSION LABORATORY DATA:  White blood cell count 9.8, hemoglobin 14.4,  platelets 244,000.  Sodium 135, potassium 2.9, chloride 103, bicarb 17, BUN  10, creatinine 1.4, glucose 328, anion gap 15.  LFTs were all normal.  Blood  acetone was read as moderate.  Urine drug screen negative.  Urinalysis  revealed a glucose of greater than 1000, ketones greater than 80, 0 to 2  white blood cells, lactate was 0.9, salicylate less than 4.   HOSPITAL COURSE:  1. Diabetic ketoacidosis.  The patient was admitted and started on an      insulin drip of 10 units per hour.  He tolerated this nicely.  His gap      normalized and his bicarb slowly increased.  He was transitioned with      NPH subcutaneous 15 units and left on the drip for 1 hour.  After the      drip stopped, however, his gap opened back up, rising to 13 and bicarb      began drop to 18.  He was restarted on insulin drip , this time, left      on for quite a bit longer.  Again, he was transitioned, this time, with      70/30 insulin with coverage over an hour while the drip was then      discontinued.  Again, his anion gap closed and his bicarb rose.  He was      transitioned to subcutaneous insulin quite nicely.  However, we found      his glucose levels continued to be elevated even though he had been      transitioned to p.o. with a carb-modified diet.  Nursing found, in the      patient's trash can, that he had been sneaking snacks including large      bags of cheese puffs.  The patient felt quite ashamed about this and      apologized, although we were not blaming him.  He did feel somewhat      remorseful that he was not adhering to the diet.  Hemoglobin A1c,      obtained while an inpatient, revealed an A1c of greater than 12, which      probably correlates with blood sugars levels, an average of 500.  We     discussed this with him and  we asked diabetes care managers to see  him.      He was provided diabetes education and felt that he already knew most      of what was being taught to him.  During his Outpatient clinic visit,      certainly he could be referred to [facility] for further education and      management of his diabetes.  He was then discharged on a regimen of      70/30 insulin 35 units in the morning, 35 units before dinner.  2. Back abscess.  The patient, in the ED, had a small, almost a nick, of      an I and D performed by ED physicians.  On the second day of admission,      it was really felt not to be adequate for drainage because the      induration remained.  For this reason, Dr. Deon Pilling was consulted and      performed, with local anesthesia, a wider incision and drainage and was      felt to have attained a much better result with this.  His induration      decreased, the erythema decreased, and his back became much less      tender.  Cultures grew staph aureus, but again, this is NOT MRSA.  He      was transitioned to oral antibiotics with doxycycline.  He was      instructed to take these 100 mg p.o. b.i.d. x2 weeks.  He was      instructed to follow up with Dr. Deon Pilling as an outpatient as listed      above.  He was also instructed to come back should the wound worsen or      if any of the swelling or erythema returned.   DISCHARGE LABS AND VITALS:  On the day of discharge, patient's temperature  was 97.1, blood pressure 138/88, pulse 94, respiratory rate 20, saturating  96% on room air.   Labs:  Sodium 136, potassium 4.3, chloride 105, bicarb 24, BUN 5, creatinine  0.9, glucose 299, anion gap of 7.  White blood cell count 7.2, hemoglobin  12.8, platelets 244,000.      Katy Apo, M.D.  Electronically Signed      Evette Doffing, M.D.  Electronically Signed    GL/MEDQ  D:  09/21/2005  T:  09/22/2005  Job:  [id number]   cc:   Georgina Quint, M.D.  [facility]

## 2010-05-27 NOTE — Discharge Summary (Signed)
[name], [name] NO.:  [id number]   MEDICAL RECORD NO.:  [id number]          PATIENT TYPE:  INP   LOCATION:  5709                         FACILITY:  [facility]   PHYSICIAN:  Lucy Chris, MD     DATE OF BIRTH:  02-12-1975   DATE OF ADMISSION:  06/16/2004  DATE OF DISCHARGE:  06/20/2004                           DISCHARGE SUMMARY - REFERRING   DISCHARGE DIAGNOSES:  1.  Diabetic ketoacidosis secondary to nonadherence to medical therapy.  2.  Type I diabetes mellitus.  3.  Urinary tract infection.  4.  Chlamydia infection.  5.  Hypertension.  6.  Obesity.  7.  History of a deep venous thrombosis and pulmonary embolism in 2004.  8.  History of colitis in 2004.  9.  Hyperlipidemia.   DISCHARGE MEDICATIONS:  1.  Ciprofloxacin 500 mg b.i.d. for seven days.  2.  Hydrochlorothiazide 25 mg daily.  3.  70/30 insulin 60 units in the morning, 45 units in the evening.  4.  K-Dur 40 meq daily.   DISCHARGE INSTRUCTIONS:  The patient is to follow-up with the internal  medicine outpatient clinic, schedule at that time needs a STAT B-Met to make  sure his potassium is in the normal range.  Also needs further diabetic  teaching and follow-up on his CBG log.   PROCEDURES:  The patient had an angiogram performed on 06/18/2004 which was  negative for any abscesses.   CONSULTANTS:  None.   HISTORY OF PRESENT ILLNESS:  [name] is a 35 year old African-American  male with type 1 diabetes mellitus who presented to the emergency room with  three to four days of generalized weakness, excessive thirst with nausea and  four episodes of emesis.  [name] reported that he did not take his  insulin for the previous two or three days because he did not feel well.  He  denied any chest pain, shortness of breath, cough, focal weakness or  dizziness.   PHYSICAL EXAMINATION:  On physical exam, vital signs:  Temperature 97  degrees, pulse of 90, blood pressure 136/83, pulse rate was  20, O2  saturation 98% on room air.  In general, [name] is obese in no acute  distress.  Eyes were equal, round, reactive to light.  Extraocular movements  were intact without any icterus.  Neck was supple without any JVD.  Oropharynx was clear.  Respiratory exam was clear without any wheezes,  rhonchi or crackles.  Cardiovascular was regular rate and rhythm with no  murmurs.  Abdomen was obese with mild tenderness in epigastric region and  some mild tympanic distention.  Extremities were without any edema, warm, 2+  pulses.  Skin was dry with decreased skin turgor. Neurologic exam:  The  patient was alert and oriented times four.  Cranial nerves 2 through 12 were  intact and motor exam was nonfocal.   LABORATORY DATA:  Labs at time of admission:  Sodium 134, potassium 4.1,  chloride of 98, bicarbonate of 8, BUN of 15, creatinine of 1.5 and a glucose  of 400.  CBC with a white blood cell count  of 8.6, hemoglobin of 16.2 and  platelet count 292,000, ANC 6.8 and CV 85.2.  ABG revealed a PH of 7.22,  PCO2 15, PO2 of 113.  Urinalysis was with small amount of leukocyte esterase  and 7 to 10 white blood cells.   HOSPITAL COURSE:  Problem:  1.  Diabetic ketoacidosis.  I believe this is most likely secondary to      nonadherence to medical regimen.  EKG was without any significant      changes.  The patient had no symptoms or signs consistent with acute      coronary syndrome.  Therefore cardiac enzymes were not cycled.  The      patient was monitored on telemetry without any events.  He was treated      presumptively for a urinary tract infection with the dysuria.  GC and      Chlamydia were also checked with positive Chlamydia for which he was      treated with Ciprofloxacin for both the UTI and the Chlamydia.  Blood      cultures were negative times two.  Urine culture was without any growth.      The patient was admitted to the ICU, started on an insulin drip and IV      fluids with q.  2 hours B-Mets and q. 1 hour CBGs.  He was maintained on      this for 24 hours without any incidences.  Glucose did not drop greater      than 75 mg per dL per hour.  He was then transitioned to his home      regimen of 70/30 insulin.  2.  Urinary tract infection.  Again, [name] was treated with      Ciprofloxacin and is to continue this for another seven days after      discharge.  Ciprofloxacin should also cover his Chlamydia infection as      well.  3.  Hypokalemia.  During the hospitalization, the patient developed some      mild hypokalemia.  It was believed this was secondary to his      Hydrochlorothiazide as well as some mild nausea and vomiting.  Potassium      was repleted and magnesium was checked and found to be normal at 1.9.      He is being discharged on 40 meq of potassium daily and is to continue      taking this until he comes back for his follow-up appointment at which      time a B-Met will be checked.  All other problems were stable during the      hospitalization.  B-Met on the day of discharge revealed a sodium of      137, potassium of 2.8, chloride 107, bicarbonate 25, glucose 151, BUN of      6, creatinine of 0.7, total bilirubin of 1.4, alkaline phosphatase of      44, AST 16, ALT of 13, total protein of 5.5, albumin 2.7, calcium 8.5.      [name] potassium is being repleted and then he will have another B-      Met before he is discharged to ensure his potassium was risen      appropriately.  CBC at the time of discharge revealed a white blood cell      count of 5.7, hemoglobin 13.3, platelet count 266,000, hemoglobin A1C      was elevated at 13.9, TSH was within  normal limits at 0.567.       KK/MEDQ  D:  06/20/2004  T:  06/20/2004  Job:  [id number]   cc:   Salome Holmes, MD  [facility]Internal Med. Resident  [place]  [place] [address]  Fax: [phone]

## 2010-05-27 NOTE — Consult Note (Signed)
NAMECHADNEY, [name] NO.:  [id number]   MEDICAL RECORD NO.:  [id number]          PATIENT TYPE:  INP   LOCATION:  [id number]                         FACILITY:  [facility]   PHYSICIAN:  Georgina Quint, M.D.   DATE OF BIRTH:  14-Oct-1975   DATE OF CONSULTATION:  09/19/2005  DATE OF DISCHARGE:                                   CONSULTATION   REASON FOR CONSULTATION:  Back abscess.   HISTORY OF PRESENT ILLNESS:  [name] is a 35 year old male patient with  known insulin-dependent diabetes mellitus, history of poor control.  Patient  reports that he has had a bump or a knot on his back for about a week.  His  girlfriend has been manually expressing purulent material from this area.  In the past 24 hours, the area became acutely increased in size.  Because of  hyperglycemia and increasing discomfort with the abscess, he presented to  the ER.  In the ER he was found to be in mild DKA with a glucose of 350,  white count was normal.  He was admitted the step-down ICU and surgical  evaluation has been requested for evaluation of this abscess.   REVIEW OF SYSTEMS:  The patient has experience nausea and vomiting secondary  to hyperglycemia, this has improved since he has been on n.p.o. status.  Patient reports that he has been adhering to his normal diabetic medication  regimen and treatment as ordered by his outpatient physician.   PAST MEDICAL HISTORY:  1. Insulin-dependent diabetes mellitus.  2. History of prior DKA secondary to medical noncompliance.  3. Prior UTI and chlamydia infections.  4. Hypertension.  5. Morbid obesity.  6. History of prior DVT with pulmonary embolus in 2004.  7. Colitis in 2004.  8. Dyslipidemia.   PAST SURGICAL HISTORY:  None.   SOCIAL HISTORY:  Form of tobacco.  He drinks about 1 to 2 alcoholic  beverages per day at time.  He does have a girlfriend.   FAMILY HISTORY:  Noncontributory.   ALLERGIES:  NO KNOWN DRUG ALLERGIES.   CURRENT  MEDICATIONS:  Please refer to the medication reconciliation sheet  for home medications.  Since admission, patient has been started on Lantus  insulin, Percocet for pain, Zosyn, vancomycin, sliding-scale insulin.  He  has also received magnesium oxide.   PHYSICAL EXAMINATION:  GENERAL:  A pleasant male patient complaining of  significant left back pain, at the abscess site, with any type of tactile  stimulation.  VITAL SIGNS:  Temperature 98.6, blood pressure 110/45, pulse 75 and regular,  respirations 20.  NEUROLOGIC:  Patient is alert and oriented x3, moving all extremities x4, no  focal deficits.  HEENT:  Head is normocephalic.  Sclerae non-injected.  NECK:  Supple no adenopathy.  CHEST:  Bilateral lung sounds clear to auscultation.  Air effort is  unlabored.  CARDIAC:  S1, S2.  No rales, murmurs, thrills, no gallop.  He is in sinus  rhythm at the bedside on the monitor.  ABDOMEN:  Obese and soft.  Nontender, nondistended, bowel sounds present.  EXTREMITIES:  Symmetrical in appearance  without edema, cyanosis, or  clubbing.  SKIN:  There is a 4 x 4 cm circular abscess in the left mid back area, which  is indurated and exquisitely tender to touch.  There is some erythema  associated directly at the abscess site.  There is 1/4 inch packing in place  with tan and purulent drainage noted on the dressing and the packing.   LABORATORY DATA:  Sodium 136, potassium 4.4, CO2 22, glucose 102, BUN 6,  creatinine 1.1.  Glucose was 350 on admission.  White count 9800, hemoglobin  14.4, platelets 244,000, neutrophils 70%.  Hemoglobin A1c 12.3, magnesium  1.8.   DIAGNOSTIC STUDIES:  None today.   IMPRESSION:  1. Draining back abscess.  2. Poorly controlled insulin-dependent diabetes mellitus.   PLAN:  1. Will discuss with Dr. Deon Pilling.  I feel that intraoperative debridement      and drainage of this wound is indicated secondary to pain issues.  2. Abscess does need a larger opening to  promote drainage and healing.      This, again, could best be achieved in an operative setting.  3. Agree with empiric Zosyn and vancomycin.  Will obtain intraoperative      cultures as well.      [place]akis Lissa Merlin, N.P.      Georgina Quint, M.D.  Electronically Signed    ALE/MEDQ  D:  09/19/2005  T:  09/19/2005  Job:  [id number]

## 2010-05-27 NOTE — Discharge Summary (Signed)
NAMESUMIT, TOROSIAN NO.:  [id number]   MEDICAL RECORD NO.:  [id number]          PATIENT TYPE:  INP   LOCATION:  [id number]                         FACILITY:  [facility]   PHYSICIAN:  Deborra Medina, M.D.     DATE OF BIRTH:  Mar 14, 1975   DATE OF ADMISSION:  10/09/2003  DATE OF DISCHARGE:  10/13/2003                                 DISCHARGE SUMMARY   DISCHARGE DIAGNOSES:  1.  Diabetic ketoacidosis.  2.  Diabetes, type 1.  3.  Hypertension.  4.  Morbid obesity.  5.  Hyperlipidemia.  6.  History of deep venous thrombosis/pulmonary embolus, status post six      months of Coumadin therapy.   DISCHARGE MEDICATIONS:  1.  Insulin 70/30 50 units q.a.m. and 45 units q.p.m.  2.  Sliding scale insulin in the afternoon.  3.  Phenergan 12.5 mg q.6h. p.r.n.   INSTRUCTIONS ON DISCHARGE:  The patient is to call [phone] to schedule an  appointment with Dr. Rolena Infante for followup.   ADMITTING HISTORY:  Mr. Lanigan is a 35 year old African American male with a  past medical history significant for type 1 diabetes with a history of DKA  in 2004.  He came in with abdominal pain, nausea and vomiting for three  days.  The patient had not been taking his insulin for the past week because  it made him sick.  He vomited about three times in the urgent care and  laboratories were checked.  Therefore transferred over here to [facility] to  be admitted.  He has a history of fever and chills on and off for the past  three days.  No history of any cough.  No chest pain.   ALLERGIES:  No known drug allergies.   SUBSTANCE HISTORY:  Does not smoke.  Occasional alcohol.  No cocaine.  No IV  drug use.   SOCIAL HISTORY:  He is single.  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   FAMILY HISTORY:  Mother had diabetes.  Has three brothers and one sister.  No medical problems.   PHYSICAL EXAMINATION:  VITAL SIGNS:  On the day of admission, pulse 100,  blood pressure 128/86, temperature  98.4 degrees, respiratory rate 16 per  minute and O2 saturation 97% on room air.  GENERAL APPEARANCE:  He is awake, alert and oriented.  HEENT:  PERRLA.  Extraocular movements intact.  Mouth with no exudate.  NECK:  No JVD.  No thyromegaly.  LUNGS:  Clear to auscultation bilaterally.  No wheezing, rhonchi or  crepitations.  ABDOMEN:  Soft.  Bowel sounds are heard.  Nontender and nondistended.  EXTREMITIES:  No cyanosis, clubbing or edema.  Pedal pulses 2+.  CARDIOVASCULAR:  Regular rate and rhythm.  No murmurs, rubs or gallops.  SKIN:  No rash.  LYMPHATICS:  No lymphadenopathy.  NEUROLOGIC:  Nonfocal examination.   LABORATORIES ON THE DAY OF ADMISSION:  Sodium 134, potassium 4.6, chloride  106, bicarbonate 13, BUN 16, creatinine 1.1, serum glucose 40 and anion gap  15.  Urine glucose greater than 500,  large bilirubin, urine ketones greater  than 160, large amount of hemoglobin, total protein about 300, nitrite  negative and leukocyte esterase negative.   HOSPITAL COURSE:  #1 - DIABETIC KETOACIDOSIS:  The patient was admitted to  the ICU and was started on IV hydration, as well as an insulin drip at 15  units per hour.  BMETs were checked every two hours and CBGs every one hour.  Potassium supplementation was given after about four hours after starting  therapy.  With the serial BMETs we checked, his gap closed and therefore he  was switched over to subcutaneous insulin 70/30 two hours prior to stopping  the drip.  After that the drip was stopped and the patient was continued on  IV hydration.  No source of infection was found.  Chest x-ray was negative.  The DKA episode was most likely related to not taking insulin.  Cardiac  enzymes were also monitored, which were negative throughout the stay x 3.  No EKG changes.   #2 - MORBID OBESITY:  The patient needs improved outpatient diet with  information via outpatient counseling.   #3 - DIABETES:  In regards to his blood sugars, even  after he closed his gap  it did remain elevated.  We  had to increase the 70/30 insulin to 60 units  q.a.m. and 45 units q.p.m. with sliding scale insulin one time during the  day.  He is to follow up with Dr. __________ as an outpatient to see if this  schedule helps.   #4 - HYPERTENSION:  The blood pressure was lowest at [id number] systolic with 70  diastolic.  This has to be reassessed as an outpatient to see if he needs  any blood pressure medications on a regular basis.   LABORATORIES ON THE DAY OF DISCHARGE:  Sodium 135, potassium 3.3, chloride  105, bicarbonate 24, BUN 6, creatinine 1.0, serum glucose 225.  White count  5.6, hemoglobin 13.2, hematocrit 37, platelets 287.       TS/MEDQ  D:  10/14/2003  T:  10/14/2003  Job:  [id number]

## 2010-05-27 NOTE — Discharge Summary (Signed)
[name], [name]               ACCOUNT NO.:  [id number]   MEDICAL RECORD NO.:  [id number]          PATIENT TYPE:  INP   LOCATION:  [id number]                         FACILITY:  [facility]   PHYSICIAN:  Debbora Lacrosse, M.D.      DATE OF BIRTH:  1975-01-29   DATE OF ADMISSION:  02/27/2004  DATE OF DISCHARGE:  02/29/2004                                 DISCHARGE SUMMARY   DISCHARGE DIAGNOSES:  1.  Diabetes mellitus and hyperglycemia with hemoglobin A1c on this      admission of 12.7.  Home insulin regimen of 70/30 60 units q.a.m. and 45      units q.p.m. with frequent medical noncompliance.  2.  Hypertension recently diagnosed and begun on HCTZ 25 mg daily on this      hospital admission.  3.  Obesity with recent 20-pound weight loss with diet and lifestyle      changes.  4.  Left ventricular ejection fraction of 50-55% with moderately increased      left ventricular wall thickness.  5.  History of deep vein thrombosis and pulmonary embolus, status post six      months of Coumadin treatment.  6.  History of C. difficile colitis in September 2004, resolved.  7.  Urinary tract infection.  8.  Oral candidiasis.   DISCHARGE MEDICATIONS:  1.  Ciprofloxacin 250 mg p.o. b.i.d. x5 days.  2.  Diflucan 100 mg p.o. daily x12 days.  3.  Hydrochlorothiazide 25 mg p.o. daily.  4.  Insulin 70/30 60 units q.a.m., 45 units q.p.m. subcu.   DISPOSITION AND FOLLOWUP:  The patient is to follow up in outpatient clinic  with Dr. Salome Holmes on March 09, 2004 at 11:10 a.m.  He is to follow up with  her for:  1.  Urinary tract infection.  He will have been treated with Ciprofloxacin 7-      day course.  2.  Oropharyngeal thrush.  The patient will complete a 14-day course of      Diflucan.  3.  Hypertension.  The patient was placed on Hydrochlorothiazide during this      visit for better blood pressure control.  4.  Diabetes mellitus.  It is noted that the patient continued to be      hyperglycemic even with his  home dose of insulin while in the hospital.      He is to follow up with Dr. Rolena Infante to adjust his insulin regimen, also      to discuss the possibility of getting other transportable forms of      insulin such as insulin pens as the patient is traveling for work and      has difficulty with insulin refrigeration.   ADMITTING HISTORY AND PHYSICAL:  The patient is a 35 year old male with  insulin-dependent diabetes mellitus with four-day history of headache,  chills, temperature to 99.7 with nausea, decreased p.o., intermittent  burning with urination, and a one-day history of orange-colored urine.  Also, few loose stools.  He notes that he had not taken his insulin in  approximately the two days prior  to admission because he felt bad.  He also  notes that in the past two weeks he was not taking insulin as directed as he  was traveling for work and the insulin went bad secondary to improper  storage.  He also notes some pain with eating food and swallowing.  He also  noted some dysuria.  He came to the emergency department for his urinary  symptoms and in the emergency department was noted to have a high glucose  and was given sliding scale insulin and two liters of normal saline bolus,  potassium, and Cipro x1.  The patient has no allergies.   His past medical history was as previously stated in the discharge  diagnoses.   HOME MEDICINES:  1.  Insulin 70/30 60 units q.a.m., 45 units q.p.m.  2.  Sliding scale insulin of about 6-8 units daily.   He does not smoke.  He occasionally drinks alcohol.  No cocaine or IV drugs.  He is living with his family.  He is traveling alot for work.  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  He is also in  school.   His family medical history is positive for mother with diabetes and three  brothers and one sister who are healthy.   On review of systems denies any unprotected sex.  He does complain of some  flaky skin  around his penis.   PHYSICAL EXAMINATION:  VITAL SIGNS:  Temperature 97.4, pulse 95, blood  pressure 128/76, respiratory rate 20, oxygen saturation 97% on room air.  HEENT:  His tongue appeared to have a white plaque consistent with thrush.  NECK:  Supple.  LUNGS:  Clear.  GI:  Positive bowel sounds.  Minimal tenderness to palpation in the left  lower quadrant.   All other exam was unremarkable.   LABS UPON ADMISSION:  I-STAT:  Sodium 129, potassium 4.2, chloride 97, BUN  13, creatinine 1, bicarb 21, anion gap 12, glucose 563, hemoglobin 16.  Labs  following insulin and fluids were as follows:  Sodium 131, potassium 3.9,  chloride 103, bicarb 21, BUN 9, creatinine 0.9, glucose 327, anion gap 6,  hemoglobin 13.2.  Urinalysis:  Specific gravity 1.038, glucose greater than  1000, ketones greater than 80, protein 30, trace blood, no nitrites or  leukocyte esterase, white blood cells 11-20, red blood cells 0-2 and rare  bacteria.   HOSPITAL COURSE:  1.  Hyperglycemia.  The patient had ketosis and initial anion gap of 12.      Although this was not full blown diabetic ketoacidosis, this patient has      a history of hospital admissions for DKA and was admitted.  He was given      fluids and switched to his home dose insulin.  His sugars remained      elevated even on his home dose insulin and appropriate fluid      resuscitation.  He will follow up with his PMD for discussion of      improving his glycemic control, may increase insulin doses, also to      consider other more transportable forms of insulin such as insulin pens.  2.  Nausea and diarrhea unknown etiology.  Stool cultures were written for.      However, the patient did not have bowel movement on this stay.  He did      not complain of any more nausea or diarrhea during his hospital course.  3.  Oropharyngeal thrush.  He was begun on Diflucan.  He will complete a 7-      day course. 4.  Questionable urinary tract infection.   Cultures are pending.  However,      white blood cell count suggests a urinary source.  He was placed on a      course of Ciprofloxacin and also Diflucan for questionable yeast.  Also,      a Gonococcus and Chlamydia urine were drawn and results at this time are      pending.  5.  Hypertension.  The patient on previous hospitalization in December 2005      was noted to have hypertension.  He was to follow up as an outpatient in      Dr. Charmayne Sheer office for further management.  On this admission, he was      noted to still have elevated blood pressures and he was started on      Hydrochlorothiazide 25 p.o. daily and he will follow up with Dr. Rolena Infante      for further management of his hypertension.   DISCHARGE LABORATORIES:  On day of discharge were as noted in HPI.  Also,  added to that his urine culture was contaminated and this was not resent.  Also, a CK was drawn and was 181, and hemoglobin A1c as previously noted was  12.7.      MN/MEDQ  D:  02/29/2004  T:  02/29/2004  Job:  [id number]   cc:   Salome Holmes, MD  [facility]Internal Med. Resident  [place]  [place] [address]  Fax: [phone]

## 2010-05-27 NOTE — Discharge Summary (Signed)
[name], [name] NO.:  [id number]   MEDICAL RECORD NO.:  [id number]                   PATIENT TYPE:  INP   LOCATION:  [id number]                                 FACILITY:  [facility]   PHYSICIAN:  Salome Holmes, MD                     DATE OF BIRTH:  August 19, 1975   DATE OF ADMISSION:  09/23/2002  DATE OF DISCHARGE:  09/27/2002                                 DISCHARGE SUMMARY   DISCHARGE DIAGNOSES:  1. Diabetic ketoacidosis.  2. Diabetes mellitus type 1, uncontrolled with hemoglobin A1C 19 September 2002.  3. Hypertension.  4. Morbid obesity.  5. Hyperlipidemia.  6. Urethritis.   DISCHARGE MEDICATIONS:  1. Insulin Novolin 70/30 42 units a.m. and 24 units p.m.  2. Novolin R before lunch and bedtime if blood glucose 151 to 200, 2 units;     201 to 250, 4 units; 251 to 300, 6 units; 301 to 350, 8 units.   DISPOSITION:  The patient was discharged home with followup with Dr. Darnell Level  as outpatient. The visit was arranged by outpatient clinic staff.   HISTORY OF PRESENT ILLNESS:  The patient is a 35 year old African-American  male with history of diabetes mellitus which was diagnosed in 2004. He was  on insulin until July 2004 when injections of insulin were discontinued.  After July 2004, the patient does not have any visit with his doctor. The  patient had been in usual state of health until 2 weeks prior to admission  when he developed polydipsia, polyhidria, and polyuria. Four days prior to  admission, he also developed nausea and abdominal pain and vomited with  digested food x1 episode. The patient also has noticed pain on urination  with penile discharge for past few days.   PHYSICAL EXAMINATION:  VITAL SIGNS:  Pulse 100. Blood pressure 143/81.  Temperature 98.9. Respiratory rate 22. Oxygen saturation 95% on room air.  GENERAL:  Obese male in no acute distress.  HEENT:  Eyes, PERRLA. EOM intact. No sinus tenderness. Pharynx is clear.  NECK:   Supple. No JVD.  LUNGS:  Respirations clear to auscultation bilaterally with good air  movement.  CARDIOVASCULAR:  Regular rhythm. No murmur or gallop.  GASTROINTESTINAL:  Abdominal soft, obese, nontender.  Bowel sounds present.  GENITOURINARY:  Purulent discharge from penis.  NEUROLOGIC:  Nonfocal.  PSYCHIATRIC:  Appropriate mood affect.   LABORATORY DATA:  White blood cells 6.6, hemoglobin 16.6, thrombocytes 216,  MCV 85.5.  ABG: pH 7.223, PCO2 of 34.  Sodium 135, potassium 4.6, chloride  106, bicarb 12, BUN 14, creatinine 1.9, glucose 409. Urinalysis specific  gravity 1.03, pH 5, glucose more than 1000, hemoglobin moderate, bilirubin  moderate, ketones more than 80, globulin more than 300, white blood cells  3.3 to 6, red blood cells 0.2 to 2. Hemoglobin A1C 10.    HOSPITAL COURSE BY  PROBLEM:  1. GTA/Uncontrolled diabetes mellitus type 1. The patient's clinical     presentation, physical examination, and lab findings were consistent with     diabetic ketoacidosis. His blood glucose was 409 and anion gap was 19 on     admission. The patient was started on normal saline and insulin drip. By     September 26, 2002 the patient's status improved. Blood glucose level was     under control. His anion gap was closed. The patient was started NPH     twice a day and sliding scale of regular insulin before meals. His     potassium was progressively improving and he was discharged home on     September 27, 2002.  2. Hypertension. Blood pressure was stable on this admission.  3. Hyperlipidemia.  Cholesterol was 201. LDL 140. There were recommendations     to decrease weight, follow low-fat diet, and exercise. If lifestyle     modifications and treatment of diabetes do not improve lipid profile, the     patient will need to start statins.  4. Urethritis. On admission, the patient complained of penile discomfort and     discharge. Culture was done and did not reveal any bacterial cause of      urethritis. We started him empirically on Rocephin and metronidazole,     followed by Zithromax 1 gram x1. It was positive response with decreased     urethral discharge and discomfort.   LABORATORY DATA ON DISCHARGE:  Sodium 133, potassium 3.1, chloride 103, CO2  of 21, glucose 266, BUN 7, creatinine 0.9, calcium 8.2. AST 26, ALT 55,  total bilirubin 1.8, indirect bilirubin 1.7, direct 0.1.  This test was done  on September 23, 2002. Lipid profile September 24, 2002 revealed a  cholesterol 201, HDL cholesterol 39, cholesterol/HDL ratio 1:5.2, VLDL 22,  LDL 140.  Hepatitis B panel and hepatitis C were negative.  On discharge his  CBC showed WBC 6.4, RBC 4.37, hemoglobin 12.8, hematocrit 37.0, MCV 84.5,  RDW 13.1, platelets 177, neutrophils 70, lymphocytes 20, monocytes 9  eosinophils 1, basophils 0.                                                Salome Holmes, MD    OB/MEDQ  D:  11/09/2002  T:  11/09/2002  Job:  [id number]

## 2010-06-29 ENCOUNTER — Emergency Department (HOSPITAL_COMMUNITY)
Admission: EM | Admit: 2010-06-29 | Discharge: 2010-06-29 | Disposition: A | Discharge status: Home / Self Care | Payer: Worker's Compensation | Attending: Emergency Medicine | Admitting: Emergency Medicine

## 2010-06-29 DIAGNOSIS — E119 Type 2 diabetes mellitus without complications: Secondary | ICD-10-CM | POA: Insufficient documentation

## 2010-06-29 DIAGNOSIS — R55 Syncope and collapse: Secondary | ICD-10-CM | POA: Insufficient documentation

## 2010-06-29 DIAGNOSIS — Z1159 Encounter for screening for other viral diseases: Secondary | ICD-10-CM | POA: Insufficient documentation

## 2010-06-29 DIAGNOSIS — Z794 Long term (current) use of insulin: Secondary | ICD-10-CM | POA: Insufficient documentation

## 2010-06-29 LAB — GLUCOSE, CAPILLARY: Glucose-Capillary: 215 mg/dL — ABNORMAL HIGH (ref 70–99)

## 2010-06-29 LAB — URINALYSIS, ROUTINE W REFLEX MICROSCOPIC
Glucose, UA: >1000 mg/dL — AB
Hgb urine dipstick: NEGATIVE
Leukocytes, UA: NEGATIVE
Protein, ur: NEGATIVE mg/dL
pH: 5.5 (ref 5.0–8.0)

## 2010-06-29 LAB — URINE MICROSCOPIC-ADD ON

## 2010-06-29 LAB — POCT I-STAT, CHEM 8
BUN: 14 mg/dL (ref 6–23)
Calcium, Ion: 1.14 mmol/L (ref 1.12–1.32)
Creatinine, Ser: 1.1 mg/dL (ref 0.50–1.35)
Glucose, Bld: 448 mg/dL — ABNORMAL HIGH (ref 70–99)
Hemoglobin: 17 g/dL (ref 13.0–17.0)
Sodium: 134 mEq/L — ABNORMAL LOW (ref 135–145)
TCO2: 25 mmol/L (ref 0–100)

## 2010-06-30 LAB — URINE CULTURE: Culture  Setup Time: 201206210116

## 2010-06-30 LAB — GLUCOSE, CAPILLARY: Glucose-Capillary: 455 mg/dL — ABNORMAL HIGH (ref 70–99)

## 2010-07-21 ENCOUNTER — Ambulatory Visit: Payer: Self-pay | Admitting: Internal Medicine

## 2010-07-25 ENCOUNTER — Encounter: Payer: Self-pay | Admitting: Internal Medicine

## 2010-07-27 ENCOUNTER — Encounter: Payer: Self-pay | Admitting: Internal Medicine

## 2010-07-27 ENCOUNTER — Ambulatory Visit (INDEPENDENT_AMBULATORY_CARE_PROVIDER_SITE_OTHER): Payer: Worker's Compensation | Admitting: Internal Medicine

## 2010-07-27 VITALS — BP 134/79 | HR 91 | Temp 99.4°F | Ht 73.0 in | Wt 320.7 lb

## 2010-07-27 DIAGNOSIS — E109 Type 1 diabetes mellitus without complications: Secondary | ICD-10-CM

## 2010-07-27 DIAGNOSIS — Z86718 Personal history of other venous thrombosis and embolism: Secondary | ICD-10-CM

## 2010-07-27 DIAGNOSIS — R55 Syncope and collapse: Secondary | ICD-10-CM

## 2010-07-27 LAB — POCT GLYCOSYLATED HEMOGLOBIN (HGB A1C): Hemoglobin A1C: 12.6

## 2010-07-27 MED ORDER — INSULIN NPH ISOPHANE & REGULAR (70-30) 100 UNIT/ML ~~LOC~~ SUSP: SUBCUTANEOUS | Status: DC

## 2010-07-27 MED ORDER — METFORMIN HCL 500 MG PO TABS: 1000.0000 mg | ORAL_TABLET | Freq: Two times a day (BID) | ORAL | Status: DC

## 2010-07-27 NOTE — Patient Instructions (Addendum)
Please return to clinic in 2 weeks.  If possible per scheduling, see Dr. Orvilla Fus on July 30th or 31st.    Please bring your glucose meter and all medications.   Return this Friday 7/20 for labwork.  Please do not eat for 12 hours beforehand.  We are also referring you for an eye exam.

## 2010-07-27 NOTE — Progress Notes (Signed)
Subjective:    Patient ID: [name], male    DOB: 1975/04/12, 35 y.o.   MRN: [id number]  HPI [name] is a 35 y/o gentleman with a history of Type 1 DM, recurrent DVT, and multiple PEs who presents for follow-up after a syncopal event on 06/29/2010.  He was at work when he experienced the event.  He had been working hard on his feet all day in the heat when he felt dizzy and weak and fell to the ground.  His friend caught him and helped him onto the ground.  He had no CP, chills, fever, dyspnea, or palpitations.  He does not think he lost consciousness, and he was not confused at all.  No numbness or tingling anywhere, no focal motor deficits besides generalized weakness when he went down.  He went to the ED.  There, his sugar was found to be in the 450s.  He was not orthostatic.  Fluids and insulin was given.  BMET showed anion gap of 12, bicarb 25.  UA negative, normal ECG.  He was sent home to follow-up here and missed appointments until now.    He was diagnosed with type 1 diabetes 18 years ago, and he has a history of poor control.  He uses 70/30 insulin 40 units BID.  He says he misses about 3 doses per week, and his sugars are much better when he takes it.  He explains that has has taken up interested in taking better care of himself, and is now working out for two hours (weights and cardio) four times per week.  His last DVT was July 2011.  He was on anticoagulation until December when he moved to [place] (has since moved back).  He has been off anticoagulation since.  Hypercoagulability labs were drawn in the acute setting when he had a DVT, with the only abnormalities being borderline high Anticardiolipin IgA, PTT Lupus Anticoagulant, and Drvvt.  He had been followed by Dr. Elie Confer when he was getting anticoagulation therapy.     Review of Systems Review of Systems - History obtained from the patient General ROS: negative for - chills, fatigue, fever or weight loss Endocrine ROS:  positive for polyuria polydipsia at baseline, negative for - palpitations Respiratory ROS: no cough, shortness of breath, or wheezing Cardiovascular ROS: no chest pain or dyspnea on exertion Gastrointestinal ROS: no abdominal pain, change in bowel habits, or black or bloody stools Genito-Urinary ROS: no dysuria, trouble voiding, or hematuria Neurological ROS: Positive for mild tingling in feet. negative for - bowel and bladder control changes, headaches or weakness Dermatological ROS: negative for rash     Objective:   Physical Exam Filed Vitals:   07/27/10 1544  BP: 134/79  Pulse: 91  Temp: 99.4 F (37.4 C)   General: alert, well-developed, and cooperative to examination.  Head: normocephalic and atraumatic.  Eyes: vision grossly intact, pupils equal, pupils round, pupils reactive to light, no injection and anicteric.  Mouth: pharynx pink and moist, no erythema, and no exudates.  Neck: no JVD.  Lungs: normal respiratory effort, no accessory muscle use, normal breath sounds, no crackles, and no wheezes. Heart: normal rate, regular rhythm, no murmur, no gallop, and no rub.  Abdomen: soft, non-tender, normal bowel sounds, no distention, no guarding, no rebound tenderness. Msk: no joint swelling, no joint warmth, and no redness over joints.  Pulses: 2+ DP/PT pulses bilaterally Extremities: No cyanosis, clubbing, edema Neurologic: alert & oriented X3, cranial nerves II-XII intact, strength normal  in all extremities, sensation intact to light touch in both feet.   Skin: turgor normal and no rashes.  Psych: Oriented X3, memory intact for recent and remote, normally interactive, good eye contact, not anxious appearing, and not depressed appearing.   Assessment & Plan:

## 2010-07-27 NOTE — Progress Notes (Deleted)
  Subjective:    Patient ID: [name], male    DOB: Dec 29, 1975, 35 y.o.   MRN: [id number]  HPI    Review of Systems     Objective:   Physical Exam        Assessment & Plan:

## 2010-07-27 NOTE — Assessment & Plan Note (Addendum)
In setting of likely dehydration, with no associated neurologic symptons, no loss of consciousness, no palpitations, and normal ECG.  This could be a vasovagal event in the setting of dehydration.  Will get blood work Friday to rule out some other causes.  Plan: TSH CMET CBC  Patient no showed for above labs.  TSH and CMET may be reconsidered at a later visit.  CBC was performed at following visit in September 2012.

## 2010-07-27 NOTE — Assessment & Plan Note (Addendum)
With recurrent DVTs and PEs, life long anticoagulation is warranted based on this patient's history.  Hypercoag panel was done during his DVT in July 2011.  PTT Lupus Anticoagulant, Drvvt, and Anticardiolipin IgA were borderline high in the setting of an acute event.  Repeating these now that he is off anticoagulation and has no current acute event may clarify a cause for anticoagulation, but it would not change our treatment plan so it is not warranted at this time.    At his follow-up visit before the end of this month, will discuss restarting anticoagulation and the importance that he goes to his regular follow-up appointments for INR monitoring since he had a problem missing his appointments in the past.

## 2010-07-27 NOTE — Assessment & Plan Note (Addendum)
Poor control with HbA1c 12.6% today.  Since he has been missing some insulin doses, we encouraged him to not miss anymore doses and instructed him to take glucose readings daily and log when he takes his insulin and metformin.  Will return to clinic in 2 weeks to evaluate his regimen based on what he is actually taking.    Plan: See above Referral to Ophthalmology for Eye Exam Microal/Cr urine today Referral to [facility] to coincide with next visit Fasting Lipid Panel Friday.  Instructed to hold morning insulin dose that morning. 10/24/10 addendum:  Patient no showed for lipid panel lab appt.  No lipid panel is on file for a long time back, so he should likely have fasting lipid panel ordered at his next follow-up visit.   Refilled metformin

## 2010-07-28 LAB — GLUCOSE, CAPILLARY: Glucose-Capillary: 183 mg/dL — ABNORMAL HIGH (ref 70–99)

## 2010-07-28 LAB — MICROALBUMIN / CREATININE URINE RATIO: Creatinine, Urine: 146.6 mg/dL

## 2010-07-28 NOTE — Progress Notes (Signed)
I saw patient and discussed his care with resident Dr. Orvilla Fus.  I agree with the clinical findings and plans as outlined in his note.

## 2010-09-14 ENCOUNTER — Ambulatory Visit: Payer: Self-pay | Admitting: Internal Medicine

## 2010-09-14 ENCOUNTER — Telehealth: Payer: Self-pay | Admitting: *Deleted

## 2010-09-14 NOTE — Telephone Encounter (Signed)
OK 

## 2010-09-14 NOTE — Telephone Encounter (Signed)
Pt calls c/o L great toe being red, swollen, warm to touch, pus filled, started Friday and has been progressively growing worse, denies fevers, states he is nauseated. Desires to be seen, appt dr Obie Dredge at 1345 per sharonb.

## 2010-09-15 ENCOUNTER — Encounter: Payer: Self-pay | Admitting: Internal Medicine

## 2010-09-15 ENCOUNTER — Ambulatory Visit (INDEPENDENT_AMBULATORY_CARE_PROVIDER_SITE_OTHER): Payer: Self-pay | Admitting: Internal Medicine

## 2010-09-15 VITALS — BP 123/80 | HR 88 | Temp 98.4°F | Ht 73.0 in | Wt 316.1 lb

## 2010-09-15 DIAGNOSIS — S90423A Blister (nonthermal), unspecified great toe, initial encounter: Secondary | ICD-10-CM | POA: Insufficient documentation

## 2010-09-15 DIAGNOSIS — X58XXXA Exposure to other specified factors, initial encounter: Secondary | ICD-10-CM

## 2010-09-15 DIAGNOSIS — IMO0002 Reserved for concepts with insufficient information to code with codable children: Secondary | ICD-10-CM

## 2010-09-15 DIAGNOSIS — E109 Type 1 diabetes mellitus without complications: Secondary | ICD-10-CM

## 2010-09-15 LAB — CBC WITH DIFFERENTIAL/PLATELET
Basophils Relative: 0 % (ref 0–1)
Eosinophils Absolute: 0.2 10*3/uL (ref 0.0–0.7)
Eosinophils Relative: 3 % (ref 0–5)
Hemoglobin: 15.8 g/dL (ref 13.0–17.0)
Lymphs Abs: 2.4 10*3/uL (ref 0.7–4.0)
MCH: 28.9 pg (ref 26.0–34.0)
MCHC: 33.8 g/dL (ref 30.0–36.0)
MCV: 85.4 fL (ref 78.0–100.0)
Monocytes Relative: 6 % (ref 3–12)
Platelets: 261 10*3/uL (ref 150–400)
RBC: 5.47 MIL/uL (ref 4.22–5.81)

## 2010-09-15 LAB — GLUCOSE, CAPILLARY: Glucose-Capillary: 326 mg/dL — ABNORMAL HIGH (ref 70–99)

## 2010-09-15 MED ORDER — DOXYCYCLINE HYCLATE 100 MG PO CAPS: 100.0000 mg | ORAL_CAPSULE | Freq: Two times a day (BID) | ORAL | Status: DC

## 2010-09-15 NOTE — Progress Notes (Signed)
Subjective:   Patient ID: [name] male   DOB: 03-27-1975 35 y.o.   MRN: [id number]  HPI: [name] is a 35 y.o. man who presents to clinic today complaining of pain in his right foot.  He states that last week he had to wear steel toed boots for his job and after that noticed some pain across the top of his foot and the bottom of his big toe.  Then Sunday before his appointment he noticed that the there was redness around the bottom of his great toe.  He denies any fevers, chills, nausea, vomiting, warmth, or drainage from the toe area.  He has had something similar before on his finger that had to be opened and drained.    He states that he has been taking all of his other medications as prescribed and has no side effects or hypoglycemia events recently.    No past medical history on file. Current Outpatient Prescriptions  Medication Sig Dispense Refill  . glucose blood (TRUETRACK TEST) test strip Use to test blood sugar 4 - 6 times a day - suggest at least before each meal and at bedtime       . insulin NPH-insulin regular (NOVOLIN 70/30) (70-30) 100 UNIT/ML injection Inject 40 units subcutaneously each AM and 40 units each PM  30 mL  1  . Lancets 30G MISC Use to check blood sugar 4-6 times daily       . metFORMIN (GLUCOPHAGE) 500 MG tablet Take 2 tablets (1,000 mg total) by mouth 2 (two) times daily.  120 tablet  1   No family history on file. History   Social History  . Marital Status: Single    Spouse Name: N/A    Number of Children: N/A  . Years of Education: N/A   Social History Main Topics  . Smoking status: Never Smoker   . Smokeless tobacco: None  . Alcohol Use: None  . Drug Use: None  . Sexually Active: None   Other Topics Concern  . None   Social History Narrative   Financial assistance approved for 100% discount at [facility] and has [facility] card per Neoma [place]8/24/2011   Review of Systems: Constitutional: Denies fever, chills, diaphoresis, appetite change  and fatigue.  HEENT: Denies photophobia, eye pain, redness, hearing loss, ear pain, congestion, sore throat, rhinorrhea, sneezing, mouth sores, trouble swallowing, neck pain, neck stiffness and tinnitus.   Respiratory: Denies SOB, DOE, cough, chest tightness,  and wheezing.   Cardiovascular: Denies chest pain, palpitations and leg swelling.  Gastrointestinal: Denies nausea, vomiting, abdominal pain, diarrhea, constipation, blood in stool and abdominal distention.  Genitourinary: Denies dysuria, urgency, frequency, hematuria, flank pain and difficulty urinating.  Musculoskeletal: Denies myalgias, back pain, joint swelling, arthralgias and gait problem.  Skin: Denies pallor, rash and wound.  Neurological: Denies dizziness, seizures, syncope, weakness, light-headedness, numbness and headaches.  Hematological: Denies adenopathy. Easy bruising, personal or family bleeding history  Psychiatric/Behavioral: Denies suicidal ideation, mood changes, confusion, nervousness, sleep disturbance and agitation  Objective:  Physical Exam: Filed Vitals:   09/15/10 1348  BP: 123/80  Pulse: 88  Temp: 98.4 F (36.9 C)  TempSrc: Oral  Height: 6\' 1"  (1.854 m)  Weight: 316 lb 1.6 oz (143.382 kg)  SpO2: 98%   Constitutional: Vital signs reviewed.  Patient is an obese,  well-developed, and well-nourished man in no acute distress and cooperative with exam. Alert and oriented x3.  Head: Normocephalic and atraumatic Ear: TM normal bilaterally Mouth: no erythema or exudates,  MMM Eyes: PERRL, EOMI, conjunctivae normal, No scleral icterus.  Neck: Supple, Trachea midline normal ROM, No JVD, mass, thyromegaly, or carotid bruit present.  Cardiovascular: RRR, S1 normal, S2 normal, no MRG, pulses symmetric and intact bilaterally Pulmonary/Chest: CTAB, no wheezes, rales, or rhonchi Abdominal: Soft. Non-tender, non-distended, bowel sounds are normal, no masses, organomegaly, or guarding present.  GU: no CVA  tenderness Musculoskeletal: Over the base and up the medial edge of the great toe is a area of callous as well as a well demarcated area of redness.  There is no warmth or swelling to the area.  There appears to be no area of fluctuance.  No joint deformities, or stiffness. Hematology: no cervical, inginal, or axillary adenopathy.  Neurological: A&O x3, Strenght is normal and symmetric bilaterally, cranial nerve II-XII are grossly intact, no focal motor deficit, sensory intact to light touch bilaterally.  Skin: Warm, dry and intact. No rash, cyanosis, or clubbing.  Psychiatric: Normal mood and affect. speech and behavior is normal. Judgment and thought content normal. Cognition and memory are normal.   Assessment & Plan:

## 2010-09-15 NOTE — Patient Instructions (Addendum)
1.  Start doxycycline 100 mg tablets twice daily  2. Follow up with me on Monday September 10th to see how your doing.  3.  Keep the area padded as best you can.  When your not wearing shoes you can use an ice pack on the toe to help decrease the swelling.  20 minutes at a time.

## 2010-09-19 ENCOUNTER — Encounter: Payer: Self-pay | Admitting: Internal Medicine

## 2010-09-19 NOTE — Assessment & Plan Note (Signed)
Lab Results  Component Value Date   HGBA1C 12.6 07/27/2010   HGBA1C 12.2 07/21/2009   HGBA1C 11.3 05/21/2009   Lab Results  Component Value Date   MICROALBUR 15.62* 07/27/2010   LDLCALC 96 12/28/2008   CREATININE 1.10 06/29/2010   His A1C is elevated though he states that he has been feeling well.  He did not bring his meter with today and likely needs adjustment of his insulins but without his meter I am not comfortable adjusting anything at this time.  We will encourage him to continue taking his medications and testing his blood sugar.  I asked him to bring his meter with him to his follow up appointment.

## 2010-09-19 NOTE — Assessment & Plan Note (Addendum)
The lesion on his toe appears to be a blood bluster from trauma from the boots.  He also has a large area of callous formation on the plantar portion of his right great toe that makes it hard to descern if there is an area of fluctuance associated with the blister.  It does not appear to be infected but he has had infected abscesses before.  We will treat him with some ice and doxycycline and see him back to see how things have progressed.  If it continues to grow he will likely need referral to a podiatrist to care for it.   CBC:    Component Value Date/Time   WBC 6.3 09/15/2010 1440   HGB 15.8 09/15/2010 1440   HCT 46.7 09/15/2010 1440   PLT 261 09/15/2010 1440   MCV 85.4 09/15/2010 1440   NEUTROABS 3.3 09/15/2010 1440   LYMPHSABS 2.4 09/15/2010 1440   MONOABS 0.4 09/15/2010 1440   EOSABS 0.2 09/15/2010 1440   BASOSABS 0.0 09/15/2010 1440

## 2010-09-21 ENCOUNTER — Ambulatory Visit: Payer: Self-pay | Admitting: Dietician

## 2010-09-21 ENCOUNTER — Encounter: Payer: Self-pay | Admitting: Internal Medicine

## 2010-09-21 ENCOUNTER — Telehealth: Payer: Self-pay | Admitting: Dietician

## 2010-09-21 NOTE — Telephone Encounter (Signed)
Note patient cancelled 2 office appointments with Dr. Obie Dredge and did not cancel CDE appointment for same day. Message left to patient to call to reschedule appointments as appropriate.

## 2010-09-29 ENCOUNTER — Encounter: Payer: Self-pay | Admitting: Internal Medicine

## 2010-10-07 LAB — COMPREHENSIVE METABOLIC PANEL
AST: 17
CO2: 28
Calcium: 9.8
Creatinine, Ser: 1.04
GFR calc Af Amer: >60
GFR calc non Af Amer: >60

## 2010-10-07 LAB — CBC
MCHC: 33.7
MCV: 87.2
Platelets: 253
RBC: 5.65

## 2010-10-07 LAB — DIFFERENTIAL
Eosinophils Relative: 0
Lymphocytes Relative: 8 — ABNORMAL LOW
Lymphs Abs: 0.8

## 2010-10-07 LAB — URINALYSIS, ROUTINE W REFLEX MICROSCOPIC
Bilirubin Urine: NEGATIVE
Glucose, UA: 100 — AB
Ketones, ur: 15 — AB
Protein, ur: 30 — AB
pH: 6

## 2010-10-07 LAB — URINE MICROSCOPIC-ADD ON

## 2010-10-07 LAB — URINE CULTURE: Colony Count: 15000

## 2010-10-24 ENCOUNTER — Encounter: Payer: Self-pay | Admitting: Internal Medicine

## 2010-10-24 NOTE — Progress Notes (Signed)
Addended by: Darnelle Going on: 10/24/2010 12:06 PM   Modules accepted: Orders

## 2010-12-29 ENCOUNTER — Telehealth: Payer: Self-pay | Admitting: *Deleted

## 2010-12-29 ENCOUNTER — Ambulatory Visit (INDEPENDENT_AMBULATORY_CARE_PROVIDER_SITE_OTHER): Payer: Self-pay | Admitting: Internal Medicine

## 2010-12-29 ENCOUNTER — Encounter: Payer: Self-pay | Admitting: Internal Medicine

## 2010-12-29 VITALS — BP 141/87 | HR 83 | Temp 96.5°F | Ht 73.0 in | Wt 315.6 lb

## 2010-12-29 DIAGNOSIS — IMO0002 Reserved for concepts with insufficient information to code with codable children: Secondary | ICD-10-CM

## 2010-12-29 DIAGNOSIS — Z23 Encounter for immunization: Secondary | ICD-10-CM

## 2010-12-29 DIAGNOSIS — L84 Corns and callosities: Secondary | ICD-10-CM

## 2010-12-29 DIAGNOSIS — S90423A Blister (nonthermal), unspecified great toe, initial encounter: Secondary | ICD-10-CM

## 2010-12-29 DIAGNOSIS — X58XXXA Exposure to other specified factors, initial encounter: Secondary | ICD-10-CM

## 2010-12-29 DIAGNOSIS — E109 Type 1 diabetes mellitus without complications: Secondary | ICD-10-CM

## 2010-12-29 DIAGNOSIS — Z79899 Other long term (current) drug therapy: Secondary | ICD-10-CM

## 2010-12-29 LAB — GLUCOSE, CAPILLARY: Glucose-Capillary: 354 mg/dL — ABNORMAL HIGH (ref 70–99)

## 2010-12-29 LAB — POCT GLYCOSYLATED HEMOGLOBIN (HGB A1C): Hemoglobin A1C: 14

## 2010-12-29 MED ORDER — METFORMIN HCL 500 MG PO TABS: 1000.0000 mg | ORAL_TABLET | Freq: Two times a day (BID) | ORAL | Status: DC

## 2010-12-29 MED ORDER — LANCETS 30G MISC: 1.0000 | Freq: Two times a day (BID) | Status: DC

## 2010-12-29 MED ORDER — GLUCOSE BLOOD VI STRP: ORAL_STRIP | Status: DC

## 2010-12-29 MED ORDER — INSULIN NPH ISOPHANE & REGULAR (70-30) 100 UNIT/ML ~~LOC~~ SUSP: SUBCUTANEOUS | Status: DC

## 2010-12-29 MED ORDER — DOXYCYCLINE HYCLATE 100 MG PO CAPS: 100.0000 mg | ORAL_CAPSULE | Freq: Two times a day (BID) | ORAL | Status: AC

## 2010-12-29 NOTE — Progress Notes (Signed)
Subjective:   Patient ID: [name] male   DOB: 1975-07-02 35 y.o.   MRN: [id number]  HPI: [name] is a 35 y.o. man who presents to clinic today for follow up of his chronic medical conditions including diabetes.  He has also had problems with callouses and corns on his feet.  He states that he ran out of metformin about 1 month ago but that he has been taking 45 units BID of his NPH.  He does admit that he is missing doses because he doesn't usually eat breakfast and doesn't want to take his insulin to work with him.  He also occasionally doesn't eat supper either.    He states that a few days ago he was wearing steel toed boots again at work and noted that he had a thickened area of skin on the instep of his right foot again.  He noticed a slightly reddened area on the foot the day prior to the visit and is concerned that it is infected.  He denies fevers, chills, or drainage from the area.  He states that he is required to wear steel toed boots for work.   No past medical history on file. Current Outpatient Prescriptions  Medication Sig Dispense Refill  . glucose blood (TRUETRACK TEST) test strip Use to test blood sugar 4 - 6 times a day - suggest at least before each meal and at bedtime  100 each  11  . insulin NPH-insulin regular (NOVOLIN 70/30) (70-30) 100 UNIT/ML injection Inject 50 units subcutaneously each AM and 50 units each PM  30 mL  11  . Lancets 30G MISC 1 each by Does not apply route 2 (two) times daily. Use to check blood sugar 4-6 times daily  100 each  6  . metFORMIN (GLUCOPHAGE) 500 MG tablet Take 2 tablets (1,000 mg total) by mouth 2 (two) times daily.  120 tablet  1   No family history on file. History   Social History  . Marital Status: Single    Spouse Name: N/A    Number of Children: N/A  . Years of Education: N/A   Social History Main Topics  . Smoking status: Never Smoker   . Smokeless tobacco: None  . Alcohol Use: None  . Drug Use: None  .  Sexually Active: None   Other Topics Concern  . None   Social History Narrative   Financial assistance approved for 100% discount at [facility] and has [facility] card per Neoma [place]8/24/2011   Review of Systems: Constitutional: Denies fever, chills, diaphoresis, appetite change and fatigue.  HEENT: Denies photophobia, eye pain, redness, hearing loss, ear pain, congestion, sore throat, rhinorrhea, sneezing, mouth sores, trouble swallowing, neck pain, neck stiffness and tinnitus.   Respiratory: Denies SOB, DOE, cough, chest tightness,  and wheezing.   Cardiovascular: Denies chest pain, palpitations and leg swelling.  Gastrointestinal: Denies nausea, vomiting, abdominal pain, diarrhea, constipation, blood in stool and abdominal distention.  Genitourinary: Denies dysuria, urgency, frequency, hematuria, flank pain and difficulty urinating.  Musculoskeletal: Denies myalgias, back pain, joint swelling, arthralgias and gait problem.  Skin: Denies pallor, rash and wound.  Neurological: Denies dizziness, seizures, syncope, weakness, light-headedness, numbness and headaches.  Hematological: Denies adenopathy. Easy bruising, personal or family bleeding history  Psychiatric/Behavioral: Denies suicidal ideation, mood changes, confusion, nervousness, sleep disturbance and agitation  Objective:  Physical Exam: Filed Vitals:   12/29/10 1340  BP: 141/87  Pulse: 83  Temp: 96.5 F (35.8 C)  TempSrc: Oral  Height: 6'  1" (1.854 m)  Weight: 315 lb 9.6 oz (143.155 kg)  SpO2: 99%   Constitutional: Vital signs reviewed.  Patient is a well-developed and well-nourished man in no acute distress and cooperative with exam. Alert and oriented x3.  Head: Normocephalic and atraumatic Ear: TM normal bilaterally Mouth: no erythema or exudates, MMM Eyes: PERRL, EOMI, conjunctivae normal, No scleral icterus.  Neck: Supple, Trachea midline normal ROM, No JVD, mass, thyromegaly, or carotid bruit present.  Cardiovascular:  RRR, S1 normal, S2 normal, no MRG, pulses symmetric and intact bilaterally Pulmonary/Chest: CTAB, no wheezes, rales, or rhonchi Abdominal: Soft. Non-tender, non-distended, bowel sounds are normal, no masses, organomegaly, or guarding present.  GU: no CVA tenderness Musculoskeletal: No joint deformities, erythema, or stiffness, ROM full and no nontender Hematology: no cervical, inginal, or axillary adenopathy.  Neurological: A&O x3, Strength is normal and symmetric bilaterally, cranial nerve II-XII are grossly intact, no focal motor deficit, sensory intact to light touch bilaterally.  Skin: there is an area of thickened skin on the instep of the right foot that has some mild surrounding erythema.  It is mildly tender to palpation.  The lateral portion of the great toe is less erythematous then his last visit but still with heavy callous build up. Warm, dry and intact. No rash, cyanosis, or clubbing.  Psychiatric: Normal mood and affect. speech and behavior is normal. Judgment and thought content normal. Cognition and memory are normal.   Assessment & Plan:

## 2010-12-29 NOTE — Telephone Encounter (Signed)
Pt states he recently started having same pain he had in his R foot when he had an infection, states he recently wore steel toed shoes and this is more than likely the reason. Desires an appt. appt per chilonb. Today, dr Obie Dredge at [facility]

## 2010-12-29 NOTE — Patient Instructions (Addendum)
1.  Increase the 70/30 to 50 units BID.  Make sure you eat breakfast and eat with supper and take your insulin with those meals.  2.  Restart Metformin 1000 mg twice daily.  If the restart gives you diarrhea start back on 500 BID and increase weekly back to the dose of 1000 mg twice.  3.  I will work to get you over to an appointment with the Foot doctor.  4.  Take an water bottle and freeze it.  Use it 20 minutes at a time several times daily to help with the swelling and inflammation in the foot.  5.  Do not wear the steel toed boots at work.  I will write you a letter.

## 2010-12-30 ENCOUNTER — Telehealth: Payer: Self-pay | Admitting: *Deleted

## 2010-12-30 NOTE — Telephone Encounter (Signed)
I will write a prescription for the crutches.  He will have to pick them up and get them to advanced otherwise he will have to wait until Thursday.

## 2010-12-30 NOTE — Telephone Encounter (Signed)
Pt called stating he saw Dr Obie Dredge on 12/20 for foot pain. He is asking for crutches, his foot is swollen so much he can't put weight on it. Pt # [id number]

## 2010-12-30 NOTE — Telephone Encounter (Signed)
Rx written and pt informed.  He called medical supply store and will get crutches there for $30. Pt advised to go to [facility] if swelling and pain do not resolve.

## 2011-01-27 ENCOUNTER — Encounter: Payer: Self-pay | Admitting: Internal Medicine

## 2011-02-19 NOTE — Assessment & Plan Note (Signed)
Lab Results  Component Value Date   HGBA1C 14.0 12/29/2010   HGBA1C 12.6 07/27/2010   HGBA1C 12.2 07/21/2009   Lab Results  Component Value Date   MICROALBUR 15.62* 07/27/2010   LDLCALC 96 12/28/2008   CREATININE 1.10 06/29/2010   His A1C today is even worse then previous which is likely secondary to missed doses of his 70/30.  He has not been checking his blood sugars at home and has a long history of non-compliance.  We will increase his 70/30 to 50 units BID.  It was stressed that he needs to make sure that he eats and takes his insulin twice daily.  He was given a refill of his insulin as well as a prescription for a meter.

## 2011-02-19 NOTE — Assessment & Plan Note (Signed)
The blood blister that he had on his right great toe is better today but still has a heavy callous burden.  We will have him follow up with the podiatrist to help him with this.  Also the shoes should help prevent the formation of these callouses.

## 2011-02-19 NOTE — Assessment & Plan Note (Signed)
He has a large area of callous formation on the right foot likely secondary to the shoes he has to wear for work.  We will have him soak the foot in epsom salt and get him to see the podiatrist to have them help him remove the callouses.  He also was given a prescription for diabetic shoes and diabetic steel toed boots which he can take to the medical supply store to help prevent these injuries in the future.

## 2011-06-07 ENCOUNTER — Encounter: Payer: Self-pay | Admitting: *Deleted

## 2011-06-07 NOTE — Progress Notes (Unsigned)
Pt walked into clinic.  He states he was at work today blood sugar was low, got dizzy, sweating.  He drank some mellow yellow and felt better.  He left work and went to occupational health and he was not treated because management did not authorize treatment.   He want to be checked out here.  It is too late to schedule him now.  I did give him an appointment for Friday at his request. He wants a note saying he was here. He feels better now.

## 2011-06-07 NOTE — Progress Notes (Unsigned)
I talked with Dr Stann Mainland and he advised pt to be seen in ED for evaluation.  We are not able to see now. Pt voices understanding and states he will go to ED.

## 2011-06-09 ENCOUNTER — Encounter: Payer: Self-pay | Admitting: Internal Medicine

## 2011-07-07 ENCOUNTER — Emergency Department (HOSPITAL_COMMUNITY): Payer: Self-pay

## 2011-07-07 ENCOUNTER — Encounter (HOSPITAL_COMMUNITY): Payer: Self-pay | Admitting: Emergency Medicine

## 2011-07-07 ENCOUNTER — Emergency Department (HOSPITAL_COMMUNITY)
Admission: EM | Admit: 2011-07-07 | Discharge: 2011-07-07 | Disposition: A | Discharge status: Home / Self Care | Payer: Self-pay | Attending: Emergency Medicine | Admitting: Emergency Medicine

## 2011-07-07 DIAGNOSIS — Z794 Long term (current) use of insulin: Secondary | ICD-10-CM | POA: Insufficient documentation

## 2011-07-07 DIAGNOSIS — Z79899 Other long term (current) drug therapy: Secondary | ICD-10-CM | POA: Insufficient documentation

## 2011-07-07 DIAGNOSIS — R739 Hyperglycemia, unspecified: Secondary | ICD-10-CM

## 2011-07-07 DIAGNOSIS — R1110 Vomiting, unspecified: Secondary | ICD-10-CM | POA: Insufficient documentation

## 2011-07-07 DIAGNOSIS — E119 Type 2 diabetes mellitus without complications: Secondary | ICD-10-CM | POA: Insufficient documentation

## 2011-07-07 DIAGNOSIS — Z86718 Personal history of other venous thrombosis and embolism: Secondary | ICD-10-CM | POA: Insufficient documentation

## 2011-07-07 HISTORY — DX: Acute embolism and thrombosis of unspecified deep veins of unspecified lower extremity: I82.409

## 2011-07-07 LAB — POCT I-STAT, CHEM 8
BUN: 20 mg/dL (ref 6–23)
Chloride: 100 mEq/L (ref 96–112)
Creatinine, Ser: 1.1 mg/dL (ref 0.50–1.35)
Sodium: 136 mEq/L (ref 135–145)
TCO2: 24 mmol/L (ref 0–100)

## 2011-07-07 LAB — URINE MICROSCOPIC-ADD ON

## 2011-07-07 LAB — COMPREHENSIVE METABOLIC PANEL
AST: 25 U/L (ref 0–37)
Albumin: 3.9 g/dL (ref 3.5–5.2)
Alkaline Phosphatase: 75 U/L (ref 39–117)
Chloride: 96 mEq/L (ref 96–112)
Potassium: 4 mEq/L (ref 3.5–5.1)
Total Bilirubin: 0.6 mg/dL (ref 0.3–1.2)
Total Protein: 7.8 g/dL (ref 6.0–8.3)

## 2011-07-07 LAB — GLUCOSE, CAPILLARY
Glucose-Capillary: 247 mg/dL — ABNORMAL HIGH (ref 70–99)
Glucose-Capillary: 242 mg/dL — ABNORMAL HIGH (ref 70–99)
Glucose-Capillary: 252 mg/dL — ABNORMAL HIGH (ref 70–99)

## 2011-07-07 LAB — URINALYSIS, ROUTINE W REFLEX MICROSCOPIC
Glucose, UA: >1000 mg/dL — AB
Ketones, ur: 15 mg/dL — AB
Protein, ur: 100 mg/dL — AB
pH: 5.5 (ref 5.0–8.0)

## 2011-07-07 LAB — CBC WITH DIFFERENTIAL/PLATELET
Basophils Absolute: 0 10*3/uL (ref 0.0–0.1)
Basophils Relative: 0 % (ref 0–1)
MCHC: 35.5 g/dL (ref 30.0–36.0)
Neutro Abs: 14.3 10*3/uL — ABNORMAL HIGH (ref 1.7–7.7)
Neutrophils Relative %: 87 % — ABNORMAL HIGH (ref 43–77)
Platelets: 236 10*3/uL (ref 150–400)
RDW: 12.2 % (ref 11.5–15.5)

## 2011-07-07 MED ORDER — INSULIN ASPART 100 UNIT/ML ~~LOC~~ SOLN
3.0000 [IU] | Freq: Once | SUBCUTANEOUS | Status: AC
  Administered 2011-07-07: 3 [IU] via INTRAVENOUS
  Filled 2011-07-07: qty 1

## 2011-07-07 MED ORDER — ONDANSETRON 4 MG PO TBDP
ORAL_TABLET | ORAL | Status: AC
  Filled 2011-07-07: qty 2

## 2011-07-07 MED ORDER — SODIUM CHLORIDE 0.9 % IV BOLUS (SEPSIS)
1000.0000 mL | Freq: Once | INTRAVENOUS | Status: AC
  Administered 2011-07-07: 1000 mL via INTRAVENOUS

## 2011-07-07 MED ORDER — ONDANSETRON 4 MG PO TBDP
8.0000 mg | ORAL_TABLET | Freq: Once | ORAL | Status: AC
  Administered 2011-07-07: 8 mg via ORAL

## 2011-07-07 MED ORDER — INSULIN ASPART 100 UNIT/ML ~~LOC~~ SOLN
4.0000 [IU] | Freq: Once | SUBCUTANEOUS | Status: AC
  Administered 2011-07-07: 4 [IU] via INTRAVENOUS

## 2011-07-07 MED ORDER — NITROFURANTOIN MONOHYD MACRO 100 MG PO CAPS: 100.0000 mg | ORAL_CAPSULE | Freq: Two times a day (BID) | ORAL | Status: DC

## 2011-07-07 MED ORDER — PROMETHAZINE HCL 12.5 MG RE SUPP: 25.0000 mg | Freq: Four times a day (QID) | RECTAL | Status: DC | PRN

## 2011-07-07 MED ORDER — INSULIN ASPART 100 UNIT/ML ~~LOC~~ SOLN
SUBCUTANEOUS | Status: AC
  Filled 2011-07-07: qty 1

## 2011-07-07 NOTE — ED Notes (Signed)
MD at bedside. 

## 2011-07-07 NOTE — ED Notes (Signed)
Patient waiting on 2nd bolus liter to finish and then patient would be discharged

## 2011-07-07 NOTE — ED Provider Notes (Signed)
History     CSN: [id number]  Arrival date & time 07/07/11  [id number]   First MD Initiated Contact with Patient 07/07/11 0542      Chief Complaint  Patient presents with  . Hyperglycemia    (Consider location/radiation/quality/duration/timing/severity/associated sxs/prior treatment) Patient is a 36 y.o. male presenting with vomiting. The history is provided by the patient.  Emesis  This is a recurrent problem. The current episode started 6 to 12 hours ago. The problem occurs 2 to 4 times per day. The problem has not changed since onset.The emesis has an appearance of stomach contents. There has been no fever. Associated symptoms include chills. Pertinent negatives include no abdominal pain, no arthralgias, no cough, no diarrhea, no fever, no myalgias, no sweats and no URI. Risk factors: none.    Past Medical History  Diagnosis Date  . Diabetes mellitus   . DVT (deep venous thrombosis)     History reviewed. No pertinent past surgical history.  No family history on file.  History  Substance Use Topics  . Smoking status: Never Smoker   . Smokeless tobacco: Not on file  . Alcohol Use: Yes      Review of Systems  Constitutional: Positive for chills. Negative for fever.  Respiratory: Negative for cough.   Gastrointestinal: Positive for vomiting. Negative for abdominal pain and diarrhea.  Musculoskeletal: Negative for myalgias and arthralgias.  All other systems reviewed and are negative.    Allergies  Review of patient's allergies indicates no known allergies.  Home Medications   Current Outpatient Rx  Name Route Sig Dispense Refill  . GLUCOSE BLOOD VI STRP  Use to test blood sugar 4 - 6 times a day - suggest at least before each meal and at bedtime 100 each 11  . INSULIN ISOPHANE & REGULAR (70-30) 100 UNIT/ML [place] SUSP  Inject 50 units subcutaneously each AM and 50 units each PM 30 mL 11  . LANCETS 30G MISC Does not apply 1 each by Does not apply route 2 (two) times daily.  Use to check blood sugar 4-6 times daily 100 each 6  . METFORMIN HCL 500 MG PO TABS Oral Take 2 tablets (1,000 mg total) by mouth 2 (two) times daily. 120 tablet 1    BP 114/72  Pulse 122  Temp 100.6 F (38.1 C) (Oral)  Resp 18  SpO2 96%  Physical Exam  Constitutional: He is oriented to person, place, and time. He appears well-developed and well-nourished.  HENT:  Head: Normocephalic and atraumatic.  Mouth/Throat: Oropharynx is clear and moist.  Eyes: Conjunctivae are normal. Pupils are equal, round, and reactive to light.  Neck: Normal range of motion. Neck supple.  Cardiovascular: Normal rate and regular rhythm.   Pulmonary/Chest: Effort normal and breath sounds normal. He has no wheezes. He has no rales.  Abdominal: Soft. Bowel sounds are normal. There is no tenderness. There is no rebound and no guarding.  Musculoskeletal: Normal range of motion.  Neurological: He is alert and oriented to person, place, and time.  Skin: Skin is warm and dry.  Psychiatric: He has a normal mood and affect.    ED Course  Procedures (including critical care time)  Labs Reviewed  GLUCOSE, CAPILLARY - Abnormal; Notable for the following:    Glucose-Capillary 247 (*)     All other components within normal limits  CBC WITH DIFFERENTIAL - Abnormal; Notable for the following:    WBC 16.5 (*)     Neutrophils Relative 87 (*)  Neutro Abs 14.3 (*)     Lymphocytes Relative 7 (*)     All other components within normal limits  COMPREHENSIVE METABOLIC PANEL - Abnormal; Notable for the following:    Glucose, Bld 282 (*)     All other components within normal limits  URINALYSIS, ROUTINE W REFLEX MICROSCOPIC - Abnormal; Notable for the following:    APPearance CLOUDY (*)     Specific Gravity, Urine 1.031 (*)     Glucose, UA >1000 (*)     Hgb urine dipstick TRACE (*)     Ketones, ur 15 (*)     Protein, ur 100 (*)     All other components within normal limits  POCT I-STAT, CHEM 8 - Abnormal;  Notable for the following:    Glucose, Bld 270 (*)     All other components within normal limits  URINE MICROSCOPIC-ADD ON - Abnormal; Notable for the following:    Squamous Epithelial / LPF FEW (*)     Casts GRANULAR CAST (*)     All other components within normal limits   No results found.   No diagnosis found.    MDM  [facility] resident will arrange close follow up.  Return for weakness.  Elevated sugars, fevers or any concerns.  Fill antibiotics and follow up with OPC.  Patient verbalizes understanding and agrees to follow up       Dominico Rod Alfonso Patten, MD 07/07/11 [phone]

## 2011-07-07 NOTE — ED Notes (Addendum)
C/o hyperglycemia.  Pt states he had broke a vial of insulin and had been using his last vial sparingly.  PT believes he is in DKA. C/o abd pain.

## 2011-07-07 NOTE — ED Notes (Signed)
Patient calling for a ride.

## 2011-07-07 NOTE — Discharge Instructions (Signed)
B.R.A.T. Diet Your doctor has recommended the B.R.A.T. diet for you or your child until the condition improves. This is often used to help control diarrhea and vomiting symptoms. If you or your child can tolerate clear liquids, you may have:  Bananas.   Rice.   Applesauce.   Toast (and other simple starches such as crackers, potatoes, noodles).  Be sure to avoid dairy products, meats, and fatty foods until symptoms are better. Fruit juices such as apple, grape, and prune juice can make diarrhea worse. Avoid these. Continue this diet for 2 days or as instructed by your caregiver. Document Released: 12/26/2004 Document Revised: 12/15/2010 Document Reviewed: 06/14/2006 [facility] Patient Information 2012 [place].Diabetes, Frequently Asked Questions WHAT IS DIABETES? Most of the food we eat is turned into glucose (sugar). Our bodies use it for energy. The pancreas makes a hormone called insulin. It helps glucose get into the cells of our bodies. When you have diabetes, your body either does not make enough insulin or cannot use its own insulin as well as it should. This causes sugars to build up in your blood. WHAT ARE THE SYMPTOMS OF DIABETES?  Frequent urination.   Excessive thirst.   Unexplained weight loss.   Extreme hunger.   Blurred vision.   Tingling or numbness in hands or feet.   Feeling very tired much of the time.   Dry, itchy skin.   Sores that are slow to heal.   Yeast infections.  WHAT ARE THE TYPES OF DIABETES? Type 1 Diabetes   About 10% of affected people have this type.   Usually occurs before the age of 70.   Usually occurs in thin to normal weight people.  Type 2 Diabetes  About 90% of affected people have this type.   Usually occurs after the age of 55.   Usually occurs in overweight people.   More likely to have:   A family history of diabetes.   A history of diabetes during pregnancy (gestational diabetes).   High blood pressure.    High cholesterol and triglycerides.  Gestational Diabetes  Occurs in about 4% of pregnancies.   Usually goes away after the baby is born.   More likely to occur in women with:   Family history of diabetes.   Previous gestational diabetes.   Obese.   Over 65 years old.  WHAT IS PRE-DIABETES? Pre-diabetes means your blood glucose is higher than normal, but lower than the diabetes range. It also means you are at risk of getting type 2 diabetes and heart disease. If you are told you have pre-diabetes, have your blood glucose checked again in 1 to 2 years. WHAT IS THE TREATMENT FOR DIABETES? Treatment is aimed at keeping blood glucose near normal levels at all times. Learning how to manage this yourself is important in treating diabetes. Depending on the type of diabetes you have, your treatment will include one or more of the following:  Monitoring your blood glucose.   Meal planning.   Exercise.   Oral medicine (pills) or insulin.  CAN DIABETES BE PREVENTED? With type 1 diabetes, prevention is more difficult, because the triggers that cause it are not yet known. With type 2 diabetes, prevention is more likely, with lifestyle changes:  Maintain a healthy weight.   Eat healthy.   Exercise.  IS THERE A CURE FOR DIABETES? No, there is no cure for diabetes. There is a lot of research going on that is looking for a cure, and progress is being  made. Diabetes can be treated and controlled. People with diabetes can manage their diabetes and lead normal, active lives. SHOULD I BE TESTED FOR DIABETES? If you are at least 35 years old, you should be tested for diabetes. You should be tested again every 3 years. If you are 60 or older and overweight, you may want to get tested more often. If you are younger than 36, overweight, and have one or more of the following risk factors, you should be tested:  Family history of diabetes.   Inactive lifestyle.   High blood pressure.  WHAT  ARE SOME OTHER SOURCES FOR INFORMATION ON DIABETES? The following organizations may help in your search for more information on diabetes: National Diabetes Education Program (NDEP) Internet: BloggerBowl.es American Diabetes Association Internet: http://www.diabetes.org  Juvenile Diabetes Foundation International Internet: AffordableSalon.es Document Released: 12/29/2002 Document Revised: 12/15/2010 Document Reviewed: 10/23/2008 [facility] Patient Information 2012 [employer].

## 2011-07-11 ENCOUNTER — Ambulatory Visit (INDEPENDENT_AMBULATORY_CARE_PROVIDER_SITE_OTHER): Payer: Self-pay | Admitting: Radiation Oncology

## 2011-07-11 ENCOUNTER — Encounter (HOSPITAL_COMMUNITY): Payer: Self-pay | Admitting: Internal Medicine

## 2011-07-11 ENCOUNTER — Inpatient Hospital Stay (HOSPITAL_COMMUNITY): Payer: Self-pay

## 2011-07-11 ENCOUNTER — Encounter: Payer: Self-pay | Admitting: Radiation Oncology

## 2011-07-11 ENCOUNTER — Inpatient Hospital Stay (HOSPITAL_COMMUNITY)
Admission: AD | Admit: 2011-07-11 | Discharge: 2011-07-17 | DRG: 638 | Disposition: A | Discharge status: Home / Self Care | Payer: Self-pay | Source: Ambulatory Visit | Attending: Internal Medicine | Admitting: Internal Medicine

## 2011-07-11 VITALS — BP 133/78 | HR 93 | Temp 98.3°F | Ht 73.0 in | Wt 307.9 lb

## 2011-07-11 DIAGNOSIS — E109 Type 1 diabetes mellitus without complications: Secondary | ICD-10-CM

## 2011-07-11 DIAGNOSIS — R112 Nausea with vomiting, unspecified: Secondary | ICD-10-CM | POA: Diagnosis present

## 2011-07-11 DIAGNOSIS — I10 Essential (primary) hypertension: Secondary | ICD-10-CM | POA: Diagnosis present

## 2011-07-11 DIAGNOSIS — E11621 Type 2 diabetes mellitus with foot ulcer: Secondary | ICD-10-CM

## 2011-07-11 DIAGNOSIS — Z91199 Patient's noncompliance with other medical treatment and regimen due to unspecified reason: Secondary | ICD-10-CM

## 2011-07-11 DIAGNOSIS — E1169 Type 2 diabetes mellitus with other specified complication: Principal | ICD-10-CM

## 2011-07-11 DIAGNOSIS — Z9119 Patient's noncompliance with other medical treatment and regimen: Secondary | ICD-10-CM

## 2011-07-11 DIAGNOSIS — R110 Nausea: Secondary | ICD-10-CM

## 2011-07-11 DIAGNOSIS — I825Y9 Chronic embolism and thrombosis of unspecified deep veins of unspecified proximal lower extremity: Secondary | ICD-10-CM | POA: Diagnosis present

## 2011-07-11 DIAGNOSIS — W19XXXA Unspecified fall, initial encounter: Secondary | ICD-10-CM | POA: Diagnosis present

## 2011-07-11 DIAGNOSIS — R609 Edema, unspecified: Secondary | ICD-10-CM

## 2011-07-11 DIAGNOSIS — I824Y9 Acute embolism and thrombosis of unspecified deep veins of unspecified proximal lower extremity: Secondary | ICD-10-CM

## 2011-07-11 DIAGNOSIS — Z86711 Personal history of pulmonary embolism: Secondary | ICD-10-CM

## 2011-07-11 DIAGNOSIS — L97519 Non-pressure chronic ulcer of other part of right foot with unspecified severity: Secondary | ICD-10-CM | POA: Diagnosis present

## 2011-07-11 DIAGNOSIS — K5900 Constipation, unspecified: Secondary | ICD-10-CM | POA: Diagnosis present

## 2011-07-11 DIAGNOSIS — L03119 Cellulitis of unspecified part of limb: Secondary | ICD-10-CM | POA: Diagnosis present

## 2011-07-11 DIAGNOSIS — Z86718 Personal history of other venous thrombosis and embolism: Secondary | ICD-10-CM

## 2011-07-11 DIAGNOSIS — IMO0001 Reserved for inherently not codable concepts without codable children: Secondary | ICD-10-CM

## 2011-07-11 DIAGNOSIS — M549 Dorsalgia, unspecified: Secondary | ICD-10-CM | POA: Diagnosis present

## 2011-07-11 DIAGNOSIS — IMO0002 Reserved for concepts with insufficient information to code with codable children: Principal | ICD-10-CM | POA: Diagnosis present

## 2011-07-11 DIAGNOSIS — E119 Type 2 diabetes mellitus without complications: Secondary | ICD-10-CM | POA: Diagnosis present

## 2011-07-11 DIAGNOSIS — Z8672 Personal history of thrombophlebitis: Secondary | ICD-10-CM

## 2011-07-11 DIAGNOSIS — L97509 Non-pressure chronic ulcer of other part of unspecified foot with unspecified severity: Secondary | ICD-10-CM | POA: Diagnosis present

## 2011-07-11 DIAGNOSIS — S335XXA Sprain of ligaments of lumbar spine, initial encounter: Secondary | ICD-10-CM | POA: Diagnosis present

## 2011-07-11 DIAGNOSIS — L02619 Cutaneous abscess of unspecified foot: Secondary | ICD-10-CM | POA: Diagnosis present

## 2011-07-11 DIAGNOSIS — R109 Unspecified abdominal pain: Secondary | ICD-10-CM

## 2011-07-11 DIAGNOSIS — Z794 Long term (current) use of insulin: Secondary | ICD-10-CM

## 2011-07-11 DIAGNOSIS — E1165 Type 2 diabetes mellitus with hyperglycemia: Principal | ICD-10-CM | POA: Diagnosis present

## 2011-07-11 HISTORY — DX: Other pulmonary embolism without acute cor pulmonale: I26.99

## 2011-07-11 LAB — COMPREHENSIVE METABOLIC PANEL
BUN: 15 mg/dL (ref 6–23)
CO2: 24 mEq/L (ref 19–32)
Calcium: 9.8 mg/dL (ref 8.4–10.5)
Chloride: 97 mEq/L (ref 96–112)
Creatinine, Ser: 0.8 mg/dL (ref 0.50–1.35)
GFR calc Af Amer: >90 mL/min (ref >90)
GFR calc non Af Amer: >90 mL/min (ref >90)
Total Bilirubin: 0.7 mg/dL (ref 0.3–1.2)

## 2011-07-11 LAB — DIFFERENTIAL
Basophils Relative: 0 % (ref 0–1)
Eosinophils Absolute: 0 10*3/uL (ref 0.0–0.7)
Eosinophils Relative: 0 % (ref 0–5)
Lymphocytes Relative: 14 % (ref 12–46)
Monocytes Absolute: 1.2 10*3/uL — ABNORMAL HIGH (ref 0.1–1.0)
Neutrophils Relative %: 75 % (ref 43–77)

## 2011-07-11 LAB — GLUCOSE, CAPILLARY: Glucose-Capillary: 241 mg/dL — ABNORMAL HIGH (ref 70–99)

## 2011-07-11 LAB — CBC
Hemoglobin: 13.7 g/dL (ref 13.0–17.0)
Platelets: 302 10*3/uL (ref 150–400)
RBC: 4.69 MIL/uL (ref 4.22–5.81)
WBC: 11.2 10*3/uL — ABNORMAL HIGH (ref 4.0–10.5)

## 2011-07-11 LAB — LIPASE, BLOOD: Lipase: 32 U/L (ref 11–59)

## 2011-07-11 MED ORDER — VANCOMYCIN HCL 1000 MG IV SOLR
2500.0000 mg | Freq: Once | INTRAVENOUS | Status: AC
  Administered 2011-07-11: 2500 mg via INTRAVENOUS
  Filled 2011-07-11: qty 2500

## 2011-07-11 MED ORDER — ENOXAPARIN SODIUM 40 MG/0.4ML ~~LOC~~ SOLN
40.0000 mg | Freq: Every day | SUBCUTANEOUS | Status: DC
  Administered 2011-07-12: 40 mg via SUBCUTANEOUS
  Filled 2011-07-11 (×3): qty 0.4

## 2011-07-11 MED ORDER — MORPHINE SULFATE 2 MG/ML IJ SOLN
2.0000 mg | INTRAMUSCULAR | Status: DC | PRN
  Administered 2011-07-11 – 2011-07-12 (×3): 2 mg via INTRAVENOUS
  Filled 2011-07-11 (×3): qty 1

## 2011-07-11 MED ORDER — ONDANSETRON HCL 4 MG/2ML IJ SOLN
4.0000 mg | Freq: Four times a day (QID) | INTRAMUSCULAR | Status: DC | PRN
  Administered 2011-07-13 – 2011-07-16 (×5): 4 mg via INTRAVENOUS
  Filled 2011-07-11 (×5): qty 2

## 2011-07-11 MED ORDER — SODIUM CHLORIDE 0.9 % IJ SOLN
3.0000 mL | Freq: Two times a day (BID) | INTRAMUSCULAR | Status: DC
  Administered 2011-07-11 – 2011-07-12 (×2): 3 mL via INTRAVENOUS

## 2011-07-11 MED ORDER — INSULIN ASPART PROT & ASPART (70-30 MIX) 100 UNIT/ML ~~LOC~~ SUSP
30.0000 [IU] | Freq: Two times a day (BID) | SUBCUTANEOUS | Status: DC
  Administered 2011-07-11 – 2011-07-15 (×5): 30 [IU] via SUBCUTANEOUS
  Filled 2011-07-11 (×2): qty 10

## 2011-07-11 MED ORDER — INSULIN ASPART 100 UNIT/ML ~~LOC~~ SOLN
0.0000 [IU] | Freq: Three times a day (TID) | SUBCUTANEOUS | Status: DC
  Administered 2011-07-13: 2 [IU] via SUBCUTANEOUS
  Administered 2011-07-13: 1 [IU] via SUBCUTANEOUS
  Administered 2011-07-13 – 2011-07-14 (×2): 3 [IU] via SUBCUTANEOUS
  Administered 2011-07-15: 2 [IU] via SUBCUTANEOUS
  Administered 2011-07-15: 3 [IU] via SUBCUTANEOUS
  Administered 2011-07-16: 2 [IU] via SUBCUTANEOUS
  Administered 2011-07-16: 1 [IU] via SUBCUTANEOUS
  Administered 2011-07-16 – 2011-07-17 (×4): 2 [IU] via SUBCUTANEOUS

## 2011-07-11 MED ORDER — SODIUM CHLORIDE 0.9 % IV SOLN
INTRAVENOUS | Status: AC
  Administered 2011-07-11: 21:00:00 via INTRAVENOUS

## 2011-07-11 MED ORDER — PIPERACILLIN-TAZOBACTAM 3.375 G IVPB
3.3750 g | Freq: Three times a day (TID) | INTRAVENOUS | Status: DC
  Administered 2011-07-11 – 2011-07-13 (×5): 3.375 g via INTRAVENOUS
  Filled 2011-07-11 (×7): qty 50

## 2011-07-11 MED ORDER — SODIUM CHLORIDE 0.9 % IV SOLN
1250.0000 mg | Freq: Two times a day (BID) | INTRAVENOUS | Status: DC
  Administered 2011-07-12 – 2011-07-13 (×3): 1250 mg via INTRAVENOUS
  Filled 2011-07-11 (×4): qty 1250

## 2011-07-11 NOTE — Assessment & Plan Note (Signed)
Patient's diabetes mellitus is poorly controlled, as was evidenced by his HbA1C of 11.8 and his blood sugar of 354 in the clinic today. This issue will be further assessed and addressed after the patient is discharged from his immediate admission.

## 2011-07-11 NOTE — Progress Notes (Signed)
  Subjective:    Patient ID: [name], male    DOB: August 18, 1975, 36 y.o.   MRN: [id number]  HPI The patient is a 36 y.o. Man with a history of poorly-controlled Type I diabetes mellitus who presents with the complaint of right foot pain and fluid drainage. He states that he has also noticed a bad odor eminating from the foot since 07/07/2011, which is when the pain began. He states that the pain has been constant since this time, and that although he received a course of nitrofurantoin following an ED visit on 07/07/2011, there has been no improvement.   The patient also complains of significant right lower back pain (10/10), that he associates with a fall from standing height that occurred on 07/07/2011, but from which he has not experienced significant pain until the past 24 hours. He denies any radiculopathy, or any sensory or motor deficits in the lower extremities. As this event occurred following his visit to the ED, he has not had any prior clinical assessment of the issue.   Review of Systems  Constitutional: Positive for fever, chills and appetite change (decreased appetite since 07/07/2011).  Respiratory: Negative for cough and shortness of breath.   Gastrointestinal: Positive for nausea, vomiting and constipation.  Musculoskeletal: Positive for back pain (right, lower back; acute).  Skin: Positive for wound (right foot, in area with associated callus).  Neurological: Negative for weakness and numbness.       Objective:   Physical Exam  Constitutional: He is oriented to person, place, and time. He appears well-developed and well-nourished. He appears distressed (secondary to pain).  HENT:  Head: Normocephalic and atraumatic.  Cardiovascular: Normal rate, regular rhythm, normal heart sounds and intact distal pulses.   Pulmonary/Chest: Effort normal and breath sounds normal.  Abdominal: Bowel sounds are normal. There is tenderness (tenderness elicited upon deep palpation ).    Musculoskeletal: Normal range of motion. He exhibits no edema.       Arms:      The patient is tender to palpation in the area of the back consistent with the lumbar region, on the right (as illustrated).  Neurological: He is alert and oriented to person, place, and time.  Skin: Skin is warm and dry.     Psychiatric: He has a normal mood and affect. His behavior is normal.          Assessment & Plan:  This is a 36 y.o. Man who presented with right foot pain and right lower back pain. Due to concern for significant infection of his right foot given his history of Type I DM, the patient was admitted to the inpatient service for more intensive management during his clinic visit. We will defer treatment decisions for both the patient's right lower back pain and his right foot pain to the admitting physicians for this time, and will have the patient follow-up with the clinic after he is improved and discharged.

## 2011-07-11 NOTE — H&P (Signed)
Date: 07/11/2011               Patient Name:  [name] MRN: [id number]  DOB: 1975-06-26 Age / Sex: 36 y.o., male   PCP: [place] Service: Internal Medicine Teaching Service              Attending Physician: Dr. Dominic Pea, DO    First Contact: Dr. Para Skeans Pager: [phone]  Second Contact: Dr. Guy Sandifer Pager: [phone]            After Hours (After 5p/  First Contact Pager: [phone]  weekends / holidays): Second Contact Pager: [phone]     Chief Complaint: festering wound on the bottom of his right foot  History of Present Illness: Patient is a 36 y.o. male with a PMHx of DMII (HgA1c 11.8),multiple remote DVTs, who presents to [facility] for evaluation of a non-healing wound on the bottom of his right foot.  He presented to the ED on 6/28 with this wound.  He was sent home with a course of nitrofurantoin.  This wound continued to bother him with pain and a malodorous serosanguinous discharge since then and has worsened.  In addition to his foot complaint, he reports a four day history of nausea and vomiting.  This began on 6/28 after his ED visit and after beginning the nitrofurantoin.  He vomited twice on 6/28 and then again on 6/30.  He reports having trouble keeping food down during this time period and a decreased appetite.  Related is a disturbance in his bowel movements.  He reports being very regular, defecating every other day.  His last BM was 6/30 and it was very loose and liquid at that point.  No blood was noted.  Additionally, he reports loosing his footing in the bathtub on the night of 6/28 and injuring his back.  He reports a sharp, severe pain in his lower right back.  He denies lower extremity weakness, numbness, or tingling.  He denies incontinence.  Review of Systems: Constitutional:  admits to subjective fever, chills, decreased appetite.  Neurological: Patient denies headaches and dizziness.  HEENT: denies photophobia, eye  pain, redness, hearing loss, ear pain, congestion, sore throat, rhinorrhea, sneezing, neck pain, neck stiffness and tinnitus.  Respiratory: denies SOB, DOE, cough, chest tightness, and wheezing.  Cardiovascular: denies chest pain, palpitations and leg swelling.  Gastrointestinal: admits to nausea, vomiting, abdominal pain Denies current diarrhea, constipation, blood in stool.  Genitourinary: admits to increased frequency Denies dysuria, urgency, frequency, hematuria, flank pain and difficulty urinating.  Musculoskeletal: Patient endorses right, lower back pain.  Endocrine: Endorses recent polydipsia.  Skin: Denies rash or other wounds.       Current Outpatient Medications: Prescriptions prior to admission  Medication Sig Dispense Refill  . insulin NPH-insulin regular (NOVOLIN 70/30) (70-30) 100 UNIT/ML injection Inject 50 Units into the skin 2 (two) times daily with a meal.      . metFORMIN (GLUCOPHAGE) 500 MG tablet Take 1,000 mg by mouth 2 (two) times daily with a meal.      . nitrofurantoin (MACRODANTIN) 100 MG capsule Take 100 mg by mouth 2 (two) times daily.      . promethazine (PHENERGAN) 12.5 MG suppository Place 12.5 mg rectally every 6 (six) hours as needed. For nausea.        Allergies: No Known Allergies   Past Medical History: Past Medical History  Diagnosis Date  . Diabetes  mellitus   . DVT (deep venous thrombosis) 2004,2006,2011    Left leg   . History of hypertension     resolved after weight loss    Past Surgical History: Past Surgical History  Procedure Date  . Back abscess i&d 2007    Family History: Family History  Problem Relation Age of Onset  . Heart disease    . Diabetes    . Obesity      Social History: History   Social History  . Marital Status: Single    Spouse Name: N/A    Number of Children: N/A  . Years of Education: N/A   Occupational History  . [employer]   . student    Social History Main Topics  . Smoking status: Never  Smoker   . Smokeless tobacco: Not on file  . Alcohol Use: Yes     occasional, q3w 2 beers  . Drug Use: No  . Sexually Active: Not on file   Other Topics Concern  . Not on file   Social History Narrative   Financial assistance approved for 100% discount at [facility] and has [facility] card per Deborah Hill8/24/2011.Lives in [place] with mother and sister.     Vital Signs: Blood pressure 151/67, pulse 99, temperature 98.8 F (37.1 C), temperature source Oral, resp. rate 16, height 6\' 1"  (1.854 m), weight 307 lb 15.7 oz (139.7 kg), SpO2 100.00%.  Physical Exam: General: Vital signs reviewed and noted. Well-developed, well-nourished, obese man in no acute distress; alert, appropriate.  Exam limited by pain and patient's inability to cooperate with the exam.  Lungs:  Normal respiratory effort. Clear to auscultation BL without crackles or wheezes.  Heart: RRR. S1 and S2 normal without gallop, murmur, or rubs.  Abdomen:  BS normoactive. Soft.  Endorses severe tenderness throughout with minimal palpation but no guarding while auscultation.  Unable to assess for organomegaly or masses because of pain.  Extremities: No pretibial edema.  Fluctuant wound on the plantar aspect of the right foot, extending into the interdigit space between the 4th and 5th digit.  Malodorous serosanguinous discharge present. Dorsalis pedis pulse 1+ on the right, 2+ on the left.  Neurologic: Patient is alert and oriented.  Movement grossly normal in all extremities.  Sensation grossly intact and specifically intact in the lower extremities.  Skin: No visible rashes, scars.  See above for foot wound.   Lab results: CBG: Basename 07/11/11 1816 07/11/11 1502  GLUCAP 241* 354*   Hemoglobin A1C: Basename 07/11/11 1516  HGBA1C 11.8    Assessment & Plan:  Assessment:  This is a 36 y/o male with a history of uncontrolled type 2 diabetes mellitus who presented to the clinic with a non-healing wound on the plantar aspect of  his right foot.  This most likely represents a mixed bacterial infection representing a complication from the uncontrolled diabetes.  Extension through the soft tissue of the foot is probable and a possibility of extension into the bony structures of the foot exists as well.  The patients nausea and vomiting could represent a reaction to the nitrofurantoin.  He vomited 4 days prior to admission and then 2 days prior and yesterday.  Gastroenteritis is another possibility, but less likely given the absence of more consistent vomiting and the lack of diarrhea.  It could also be diabetic gastroparesis, but the acute nature makes this unlikely.  The patient's back pain is most likely a result of his fall 4 days ago.  The most likely injury  is a bruised muscle or a muscle spasm.  A spinal fracture or dislocation is also a possibility.  Plan:  1) Right food infectious wound - given presentation of malodorous, serosanguinous drainage, fluctuence of surrounding skin, we are most concerned for diabetic wound with possibility of osteomyelitis. - Start Vanc/ Zosyn for GPC/ GNR/ anerobic, and MRSA, Pseudomonas coverage. - Surgery consult placed for consideration for wound debridement; if they proceed, deep wound cx may help narrow abx spectrum. - spoke with Dr. Grandville Silos who will see the patient. - XR right foot. - MRI right foot with contrast to evaluate for osteomyelitis. - Will check blood cultures (in setting of N/V/foot wound) - although bacteremia is very less likely given that he remains afebrile.  2) DMII, uncontrolled, with proteinuria. A1c 11.8 - patient reports medication noncompliance of approximately 2x per week when he is missing his insulin because unable to take at work. He has proteinuria noted on multiple microalbumin/ Cr. - Will continue on reduced regimen of Novolin 70/30 during hospital course, in setting of possible need for surgical intervention. - Continue SSI. - Will attempt to better  understand barriers to compliance, and consider change to Lantus insulin to allow for better compliance (if financially considerable). - Will recheck microalbumin/ Cr, consider addition of ACE-I for reno-protective effects.  3) History of hypertension - not currently on home antihypertensives. Mildly elevated blood pressure on admission, likely also influenced by acute pain. - Will continue to monitor for now, will consider for addition of ACE-I as described above (likely low dose such as Lisinopril 5-10 mg if microalb/cr elevated and BP does not show significant elevation).  4) Nausea and vomiting - unclear etiology, given acuity of symptoms, there is a concern for possible gastroenteritis, although patient remains without fever at this time. N/V in the setting of DKA/ HHS could be considered, but with patient reported fasting blood sugars of 180s mg/dL, do not suspect that this is the acute cause. As well, pancreatitis is considered - although he has no clear risk factor aside from obesity, to explain reason for an acute episode (Ie - no alcohol abuse, no history of gallstones, etc). Less likely, although still considered is diabetic gastroparesis, particularly in the setting of poorly controlled diabetes, however, the acuity of the presenting symptoms would not support this diagnosis. No labs available at the time of admission. - Check CMET - Check lipase.  - Check urinalysis. - Continue supportive care with Zofran. - Provide IVF with NS at 100 cc/hr. - Check orthostatic vital signs to assess volume status.  5) Back pain - likely musculoskeletal pain (muscle spasm) in setting of his fall, no concerning neurologic findings or complaints to suggest neurologic compromise. However, given exam limited by patient's pain, will need to further explore differentials after pain better controlled. - Add Morphine 2 mg q4h PRN (will switch to oral after determination if surgical intervention needed for #1). -  Consider lumbar spine XR tomorrow after adequate time to reassess. - Consider imaging sooner if new symptoms arise.  6) DVTs - patient has a history of multiple lower extremity DVTs.  He is not complaining of any concerning symptoms now, but we will investigate this futher.  He may require long term anticoagulation. -Lovenox for prophylaxis while here    Signed: Dorothy Spark, MD PGY-I, Internal Medicine Resident 07/11/2011, 7:44 PM

## 2011-07-11 NOTE — Progress Notes (Signed)
ANTIBIOTIC CONSULT NOTE - INITIAL  Pharmacy Consult for Vanco Indication: Diabetic foot ulcer  No Known Allergies  Patient Measurements: Height: 6\' 1"  (185.4 cm) Weight: 307 lb 15.7 oz (139.7 kg) IBW/kg (Calculated) : 79.9  Adjusted Body Weight: 98 kg  Vital Signs: Temp: 98.8 F (37.1 C) (07/02 1829) Temp src: Oral (07/02 1829) BP: 151/67 mmHg (07/02 1848) Pulse Rate: 99  (07/02 1829) Intake/Output from previous day:   Intake/Output from this shift:    Labs: No results found for this basename: WBC:3,HGB:3,PLT:3,LABCREA:3,CREATININE:3 in the last 72 hours Estimated Creatinine Clearance: 138.9 ml/min (by C-G formula based on Cr of 1.1). No results found for this basename: VANCOTROUGH:2,VANCOPEAK:2,VANCORANDOM:2,GENTTROUGH:2,GENTPEAK:2,GENTRANDOM:2,TOBRATROUGH:2,TOBRAPEAK:2,TOBRARND:2,AMIKACINPEAK:2,AMIKACINTROU:2,AMIKACIN:2, in the last 72 hours   Microbiology: No results found for this or any previous visit (from the past 720 hour(s)).  Medical History: Past Medical History  Diagnosis Date  . Diabetes mellitus   . DVT (deep venous thrombosis) 2004,2006,2011    Left leg   . History of hypertension     resolved after weight loss    Medications:  Prescriptions prior to admission  Medication Sig Dispense Refill  . insulin NPH-insulin regular (NOVOLIN 70/30) (70-30) 100 UNIT/ML injection Inject 50 Units into the skin 2 (two) times daily with a meal.      . metFORMIN (GLUCOPHAGE) 500 MG tablet Take 1,000 mg by mouth 2 (two) times daily with a meal.      . nitrofurantoin (MACRODANTIN) 100 MG capsule Take 100 mg by mouth 2 (two) times daily.      . promethazine (PHENERGAN) 12.5 MG suppository Place 12.5 mg rectally every 6 (six) hours as needed. For nausea.       Assessment: Poorly controlled Type 1 DM with R foot pain, fluid drainage, bad odor. No improved with Nitrofurantoin received in ED 6/28. Scr slightly elevated at 1.1. Last WBC 16.5. Afebrile  Goal of Therapy:    Vancomycin trough level 10-15 mcg/ml  Plan:  Vanco 2500mg  IV load x 1 then 1250mg  IV q12h. Vanco trough after 3-5 doses at steady state.  Eilene Ghazi Stillinger 07/11/2011,7:16 PM

## 2011-07-12 ENCOUNTER — Inpatient Hospital Stay (HOSPITAL_COMMUNITY): Payer: Self-pay

## 2011-07-12 ENCOUNTER — Encounter (HOSPITAL_COMMUNITY): Payer: Self-pay | Admitting: Neurology

## 2011-07-12 DIAGNOSIS — L97509 Non-pressure chronic ulcer of other part of unspecified foot with unspecified severity: Secondary | ICD-10-CM

## 2011-07-12 DIAGNOSIS — M549 Dorsalgia, unspecified: Secondary | ICD-10-CM | POA: Diagnosis present

## 2011-07-12 DIAGNOSIS — L97409 Non-pressure chronic ulcer of unspecified heel and midfoot with unspecified severity: Secondary | ICD-10-CM

## 2011-07-12 DIAGNOSIS — L97519 Non-pressure chronic ulcer of other part of right foot with unspecified severity: Secondary | ICD-10-CM | POA: Diagnosis present

## 2011-07-12 LAB — GLUCOSE, CAPILLARY

## 2011-07-12 LAB — URINALYSIS, MICROSCOPIC ONLY
Ketones, ur: 40 mg/dL — AB
Leukocytes, UA: NEGATIVE
Nitrite: NEGATIVE
Urobilinogen, UA: 1 mg/dL (ref 0.0–1.0)
pH: 5.5 (ref 5.0–8.0)

## 2011-07-12 LAB — RAPID URINE DRUG SCREEN, HOSP PERFORMED
Amphetamines: NOT DETECTED
Barbiturates: NOT DETECTED
Benzodiazepines: NOT DETECTED
Tetrahydrocannabinol: NOT DETECTED

## 2011-07-12 LAB — BASIC METABOLIC PANEL
Calcium: 9.8 mg/dL (ref 8.4–10.5)
Creatinine, Ser: 0.7 mg/dL (ref 0.50–1.35)
GFR calc non Af Amer: >90 mL/min (ref >90)
Glucose, Bld: 111 mg/dL — ABNORMAL HIGH (ref 70–99)
Sodium: 138 mEq/L (ref 135–145)

## 2011-07-12 LAB — MICROALBUMIN / CREATININE URINE RATIO
Creatinine, Urine: 235.1 mg/dL
Creatinine, Urine: 247.5 mg/dL
Microalb, Ur: 161.88 mg/dL — ABNORMAL HIGH (ref 0.00–1.89)
Microalb, Ur: 85.58 mg/dL — ABNORMAL HIGH (ref 0.00–1.89)

## 2011-07-12 LAB — C-REACTIVE PROTEIN: CRP: 8.07 mg/dL — ABNORMAL HIGH (ref <0.60)

## 2011-07-12 LAB — SEDIMENTATION RATE: Sed Rate: 57 mm/hr — ABNORMAL HIGH (ref 0–16)

## 2011-07-12 MED ORDER — POLYETHYLENE GLYCOL 3350 17 G PO PACK
17.0000 g | PACK | Freq: Every day | ORAL | Status: DC
  Administered 2011-07-13 – 2011-07-14 (×2): 17 g via ORAL
  Filled 2011-07-12 (×3): qty 1

## 2011-07-12 MED ORDER — GADOBENATE DIMEGLUMINE 529 MG/ML IV SOLN
20.0000 mL | Freq: Once | INTRAVENOUS | Status: AC | PRN
  Administered 2011-07-12: 20 mL via INTRAVENOUS

## 2011-07-12 MED ORDER — LISINOPRIL 5 MG PO TABS
5.0000 mg | ORAL_TABLET | Freq: Every day | ORAL | Status: DC
  Administered 2011-07-12 – 2011-07-15 (×4): 5 mg via ORAL
  Filled 2011-07-12 (×4): qty 1

## 2011-07-12 MED ORDER — SODIUM CHLORIDE 0.9 % IV SOLN
INTRAVENOUS | Status: AC
  Administered 2011-07-12: 13:00:00 via INTRAVENOUS

## 2011-07-12 MED ORDER — CYCLOBENZAPRINE HCL 10 MG PO TABS
5.0000 mg | ORAL_TABLET | Freq: Three times a day (TID) | ORAL | Status: DC | PRN
  Administered 2011-07-13 – 2011-07-17 (×5): 5 mg via ORAL
  Filled 2011-07-12 (×6): qty 1

## 2011-07-12 MED ORDER — MORPHINE SULFATE 4 MG/ML IJ SOLN
6.0000 mg | INTRAMUSCULAR | Status: DC | PRN
  Administered 2011-07-12: 4 mg via INTRAVENOUS
  Administered 2011-07-12: 6 mg via INTRAVENOUS
  Filled 2011-07-12: qty 2
  Filled 2011-07-12: qty 1

## 2011-07-12 MED ORDER — HYDROCODONE-ACETAMINOPHEN 5-325 MG PO TABS
2.0000 | ORAL_TABLET | Freq: Four times a day (QID) | ORAL | Status: DC | PRN
  Administered 2011-07-12 – 2011-07-14 (×8): 2 via ORAL
  Filled 2011-07-12 (×8): qty 2

## 2011-07-12 NOTE — Care Management Note (Signed)
  Page 2 of 2   07/17/2011     3:45:56 PM   CARE MANAGEMENT NOTE 07/17/2011  Patient:  [name], [name]   Account Number:  [id number]  Date Initiated:  07/12/2011  Documentation initiated by:  Lars Pinks  Subjective/Objective Assessment:   PT WAS ADMITTED WITH FOOT ULCERS AND UNCONTROLLED DM     Action/Plan:   PROGRESSION OF CARE AND DISCHARGE PLANNING   Anticipated DC Date:  07/14/2011   Anticipated DC Plan:  [place]  CM consult  Medication Assistance      Choice offered to / List presented to:             Status of service:  In process, will continue to follow Medicare Important Message given?   (If response is "NO", the following Medicare IM given date fields will be blank) Date Medicare IM given:   Date Additional Medicare IM given:    Discharge Disposition:    Per UR Regulation:  Reviewed for med. necessity/level of care/duration of stay  If discussed at [place] of Stay Meetings, dates discussed:    Comments:  07/17/2011 1544 Contacted main pharmacy and they have his Lovenox for home ready for pick up when he is ready for d/c. Jonnie Finner RN CCM Case Mgmt phone [phone]  07/17/2011 1400  ( late entry 07/16/2011 1830 Contacted main pharmacy and he is eligible for [employer] med assistance fund. Pt states he work and pays for his meds. Provided pt with community resources such as [employer], [employer], [employer], and [employer] Gave pt a community pharmacy discount card that can be used for out of pocket meds and may discount his meds.  Lovenox application was sent to main pharmacy to fill for d/c on 7/7 using [place]. NCM explained he will receive the Lovenox at time of d/c.  Explained to pt that proof of income needed to accompany application and states he will have family bring after he gets home. He did not have family that has assess to his financial information. NCM will take proof income to  main pharmacy once received by pt. Jonnie Finner RN CCM Case Mgmt phone [phone]   07/12/11 Lars Pinks, RN, BSN [id number] PT WAS ADMITTED WITH A FOOT ULCER AN DNONCOMPLICANCE  WITH DM AND HTN.  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 WILL F/U ON DC NEEDS AND PT/OT EVAL.

## 2011-07-12 NOTE — Progress Notes (Signed)
07/12/11  Spoke with patient about his diabetes.  Sees Dr. Berkley Harvey for his diabetes.  States that his appetite has not been good recently.  Has a right foot ulcer and had a fall last week and is experiencing back pain at this time.  States that he has not taken his insulin in the last few days due to not eating well.  States that he cannot afford Lantus insulin at this time. HgbA1C is 11.8%.  Case management has seen patient.  Will continue to follow while in hospital.

## 2011-07-12 NOTE — Progress Notes (Signed)
Internal Medicine Teaching Service Attending Note Date: 07/12/2011  Patient name: [name]  Medical record number: [id number]  Date of birth: 06-21-1975    This patient has been seen and discussed with the house staff. Please see their note for complete details. I concur with their findings with the following additions/corrections: See my note from 07/12/11.  Dominic Pea 07/12/2011, 3:25 PM

## 2011-07-12 NOTE — Progress Notes (Signed)
Internal Medicine Teaching Service Attending Note Date: 07/12/2011  Patient name: [name]  Medical record number: [id number]  Date of birth: 1975-08-14    This patient has been seen and discussed with the house staff. Please see their note for complete details. I concur with their findings with the following additions/corrections: 74 yr.old AAM w/ hx uncontrolled IDDM type 2, HTN, morbid obesity with BMI 40, with reported acute nontraumatic wound on plantar aspect of right foot concerning for diabetic ulcer and possibly underlying osteomyelitis, with recent fall and worsening back pain, in addition to N/V since initiating Marcrobid.  -Diabetic foot ulcer: I suspect this is chronic and only recently became infected. He has uncontrolled DM and admits to skipping insulin doses. Continue Vanc and Zosyn for now. MRI of the foot to r/o underlying osteomyelitis. Order ESR. BC taken. Leukocytosis noted. Will obtain ortho consult if evidence of osteomyelitis.  -Acute lumbar strain: I doubt underlying fracture, but patient states worsening pain that is significant with movement. Obtain lumbar x-ray to r/o fracture. Flexeril 5 mg PO tid PRN. May use Norco for pain PRN as well.  -Nausea and vomiting: noted ketones in urine in addition to granular casts in urine. IVF with NS for now. I suspect secondary to recent PO antibiotic. Zofran for now.  -IDDM type 2: HbA1C of 11.8%, this is terribly uncontrolled. Educated him on use of insulin and complications of diabetes, including foot ulcers. Continue Novolog and SS.  -HTN: add low dose lisinopril.  -hx DVT and PE: Need to review records. If he has had multiple occurrences then he will need lifelong anticoagulation. He has history of nonadherence to therapy and coumadin may not be best choice for him due to need for monitoring. Xarelto may not be affordable. He does not seem inclined to accepting Lovenox treatment on my interview. Will need to consult Hematology for  their input.  -Rest per resident physician note.   Dominic Pea 07/12/2011, 10:32 AM

## 2011-07-12 NOTE — Progress Notes (Signed)
Subjective: This morning, [name] continues to complain of severe back pain.  He rates it 10/10 and worse than last night at admission.  He also complains of abdominal pain across the midportion of his abdomen and thinks it may be due to constipation.  He denies chest pain, dyspnea, and headaches.  Objective: Vital signs in last 24 hours: Temp:  [98.3 F (36.8 C)-99 F (37.2 C)] 98.6 F (37 C) (07/03 0600) Pulse Rate:  [87-99] 89  (07/03 0600) Resp:  [16-19] 19  (07/03 0600) BP: (133-170)/(67-91) 147/83 mmHg (07/03 0600) SpO2:  [92 %-100 %] 96 % (07/03 0600) Weight:  [303 lb (137.44 kg)-307 lb 15.7 oz (139.7 kg)] 303 lb (137.44 kg) (07/03 0500) Weight change:  Last BM Date: 07/11/11  Intake/Output from previous day: 07/02 0701 - 07/03 0700 In: 120 [P.O.:120] Out: 850 [Urine:850]  General appearance: alert and mild distress Resp: clear to auscultation bilaterally Cardio: regular rate and rhythm, S1, S2 normal, no murmur, click, rub or gallop GI: Soft, tender to palpation in the epigastrium; no guarding and no rebound tenderness was appreciated; normal bowel sounds present. Extremities: No edema Pulses: Right Pulses: DP: present 1+ Left Pulses: DP: present 2+ Skin: Skin color, texture, turgor normal. No rashes or lesions Incision/Wound: Festering wound on the plantar aspect of the right foot remains unchanged with extension into the interdigit space of the 4th and 5th digits; malodorous, purulent drainage present.  Lab Results:  [facility] 07/11/11 1940  WBC 11.2*  HGB 13.7  HCT 38.8*  PLT 302   BMET  Basename 07/11/11 1940  NA 136  K 4.2  CL 97  CO2 24  GLUCOSE 230*  BUN 15  CREATININE 0.80  CALCIUM 9.8    Studies/Results: Dg Foot Complete Right  07/11/2011  *RADIOLOGY REPORT*  Clinical Data: Draining lesions between the fourth and fifth toes. History of diabetes.  RIGHT FOOT COMPLETE - 3+ VIEW  Comparison: 06/03/2003  Findings: Bones appear osteopenic.  There is  no evidence for acute fracture or subluxation.  No radiopaque foreign body or soft tissue gas identified.  Soft tissue swelling is noted in the region of the fifth metatarsal phalangeal joint.  Note is made of plantar and Achilles calcaneal spurs.  IMPRESSION:  1.  Significant soft tissue swelling. 2.  No evidence for fracture or foreign body.  Original Report Authenticated By: Glenice Bow, M.D.    Medications:  I have reviewed the patient's current medications.  Scheduled:   . enoxaparin  40 mg Subcutaneous q1800  . insulin aspart  0-9 Units Subcutaneous TID WC  . insulin aspart protamine-insulin aspart  30 Units Subcutaneous BID WC  . piperacillin-tazobactam (ZOSYN)  IV  3.375 g Intravenous Q8H  . sodium chloride  3 mL Intravenous Q12H  . vancomycin  1,250 mg Intravenous Q12H  . vancomycin  2,500 mg Intravenous Once   Continuous:   . sodium chloride 100 mL/hr at 07/11/11 2030   [id number] injection, ondansetron, DISCONTD:  morphine injection  Assessment/Plan:  1. Right foot infection:  Likely secondary to his uncontrolled diabetes.  We are concerned about extension through the soft tissue of the foot as well as extension into the bony structures.  Continue vancomycin and Zosyn for broad coverage including MRSA and Pseudomonas  Appreciate Gen Surg recommendations; wound cx is pending  MRI of right foot pending  XR right foot showes no evidence of boney involvment but does show soft tissue swelling along the lateral aspect of the foot  Ankle-Brachial  Index ordered to evaluate for peripheral vascular disease 2. Type II Diabetes Mellitus:  Patient has been noncompliant with his insulin regime, resulting in very poorly controlled glood sugars (A1c 11.8).  Continue NovoLog 70/30 30U twice daily with meals and NovoLog SSI  Will try to educate patient on importance of controlling his blood sugar and we will try to understand his barriers to this  Urinalysis showed positive  ketones and the anion gap was 15 - we will continue hydrating with IVF and follow with BMET now and daily BMET in the future  XR right foot showes no evidence of boney involvment but does show soft tissue swelling along the lateral aspect of the foot  We will check microalbumin/Cr and consider beginning an ACEi and ASA 3. Hypertension:  Patient was not on any anti-hypertensives before admission.  Current elevated blood pressures may be from his back pain, but we will follow this.  He remains hypertensive, but this may be due to pain; we will get his pain undercontrol and then reassess  An ACEi would be the first line treatment if he is in fact hypertensive 4. Nausea and Vomiting:  Possible etiologies are broad.  DKA is certainly a possibiliyt over the past few days as his AG was 15 on admission and U/A showed positive ketones.  He is unlikely to be in DKA right now.  His back pain, foot infection, and nitrofurantoin may be contributing as well.  Gastroenteritis is unlikely at this point.  Continue Zofran as needed for nausea  Will provide laxatives once his pain is better controlled today 5. Back Pain:  Pt fell 5 days ago and complains of left lower back pain.  There are no concerning neurological signs.  Morphine increased to 4-6mg  q4hrs  Diagnostic 3view lumbar xray ordered 6. DVT:  History of DVT and PE in 2004 and a second DVT in 2011  SQ Lovenox given for prophylaxis  Will discuss with patient options going forward given his probable need for long term anticoagulation     LOS: 1 day   Dorothy Spark 07/12/2011, 10:03 AM

## 2011-07-12 NOTE — Progress Notes (Addendum)
*  PRELIMINARY RESULTS* Vascular Ultrasound ABI has been completed.  Preliminary findings: Bilateral ABIs: within normal limits.   [name] [name] 07/12/2011, 4:18 PM

## 2011-07-12 NOTE — Progress Notes (Signed)
X-rays show no evidence of osteomyelitis.  Would culture the right foot and adjust antibiotics accordingly..  If surgical intervention is necessary would get orthopedic surgery involved because most of the general surgeons do not perform ray amputations which would mostly likely not be necessary here, but included in the surgical considerations.  Kathryne Eriksson. Dahlia Bailiff, MD, [place] [phone] [facility] Surgery

## 2011-07-12 NOTE — H&P (Addendum)
Internal Medicine Teaching Service Attending Note Date: 07/12/2011  Patient name: [name]  Medical record number: [id number]  Date of birth: 06-21-1975   I have seen and evaluated [name] and discussed their care with the Residency Team. 70 yr. Old AAM w/ hx IDDM type 2, HTN, history of multiple DVT's reported by patient in addition to PE, morbid obesity with BMI 40, presented with back pain s/p mechanical fall and malodorous draining foot wound that he says he's noticed past few days w/o hx of trauma. Also complains of history of N/V post staring nitrofurantoin over past few days. States his back is his biggest complaint now, pain medication is not helping. Denies saddle anesthesia and no weakness or numbness of his LE.  He admits to being treated in the past for DVT with 6 months of coumadin, but admits to multiple DVT's and a PE. Admits he skips insulin doses due to difficulty with work schedule.  Physical Exam: Blood pressure 147/83, pulse 89, temperature 98.6 F (37 C), temperature source Oral, resp. rate 19, height 6\' 1"  (1.854 m), weight 137.44 kg (303 lb), SpO2 96.00%. General appearance: alert, cooperative, appears stated age and no distress Head: Normocephalic, without obvious abnormality, atraumatic Eyes: conjunctivae/corneas clear. PERRL, EOM's intact. Fundi benign. Nose: Nares normal. Septum midline. Mucosa normal. No drainage or sinus tenderness. Back: tender to palpation over right flank and midline L spine from L2-L4 Lungs: clear to auscultation bilaterally Abdomen: soft, non-tender; bowel sounds normal; no masses,  no organomegaly and no guarding or tenderness appreciated on my exam Extremities: plantar aspect right foot wound between 4th and 5th digit with serous discharge and malodorous. Palpable pulses bilat with 2+ pulses. trace bilat pitting edema. Pulses: 2+ pulses bilat Neurologic: Alert and oriented X 3, normal strength and tone. Normal symmetric reflexes.  Normal coordination and gait  Lab results: Results for orders placed during the hospital encounter of 07/11/11 (from the past 24 hour(s))  GLUCOSE, CAPILLARY     Status: Abnormal   Collection Time   07/11/11  6:16 PM      Component Value Range   Glucose-Capillary 241 (*) 70 - 99 mg/dL  COMPREHENSIVE METABOLIC PANEL     Status: Abnormal   Collection Time   07/11/11  7:40 PM      Component Value Range   Sodium 136  135 - 145 mEq/L   Potassium 4.2  3.5 - 5.1 mEq/L   Chloride 97  96 - 112 mEq/L   CO2 24  19 - 32 mEq/L   Glucose, Bld 230 (*) 70 - 99 mg/dL   BUN 15  6 - 23 mg/dL   Creatinine, Ser 0.80  0.50 - 1.35 mg/dL   Calcium 9.8  8.4 - 10.5 mg/dL   Total Protein 7.6  6.0 - 8.3 g/dL   Albumin 3.4 (*) 3.5 - 5.2 g/dL   AST 10  0 - 37 U/L   ALT 14  0 - 53 U/L   Alkaline Phosphatase 70  39 - 117 U/L   Total Bilirubin 0.7  0.3 - 1.2 mg/dL   GFR calc non Af Amer >90  >90 mL/min   GFR calc Af Amer >90  >90 mL/min  CBC     Status: Abnormal   Collection Time   07/11/11  7:40 PM      Component Value Range   WBC 11.2 (*) 4.0 - 10.5 K/uL   RBC 4.69  4.22 - 5.81 MIL/uL   Hemoglobin 13.7  13.0 -  17.0 g/dL   HCT 38.8 (*) 39.0 - 52.0 %   MCV 82.7  78.0 - 100.0 fL   MCH 29.2  26.0 - 34.0 pg   MCHC 35.3  30.0 - 36.0 g/dL   RDW 12.3  11.5 - 15.5 %   Platelets 302  150 - 400 K/uL  DIFFERENTIAL     Status: Abnormal   Collection Time   07/11/11  7:40 PM      Component Value Range   Neutrophils Relative 75  43 - 77 %   Lymphocytes Relative 14  12 - 46 %   Monocytes Relative 11  3 - 12 %   Eosinophils Relative 0  0 - 5 %   Basophils Relative 0  0 - 1 %   Neutro Abs 8.4 (*) 1.7 - 7.7 K/uL   Lymphs Abs 1.6  0.7 - 4.0 K/uL   Monocytes Absolute 1.2 (*) 0.1 - 1.0 K/uL   Eosinophils Absolute 0.0  0.0 - 0.7 K/uL   Basophils Absolute 0.0  0.0 - 0.1 K/uL   Smear Review MORPHOLOGY UNREMARKABLE    LIPASE, BLOOD     Status: Normal   Collection Time   07/11/11  7:40 PM      Component Value Range   Lipase  32  11 - 59 U/L  GLUCOSE, CAPILLARY     Status: Abnormal   Collection Time   07/11/11  9:19 PM      Component Value Range   Glucose-Capillary 166 (*) 70 - 99 mg/dL   Comment 1 Documented in Chart     Comment 2 Notify RN    URINALYSIS, WITH MICROSCOPIC     Status: Abnormal   Collection Time   07/12/11 12:38 AM      Component Value Range   Color, Urine AMBER (*) YELLOW   APPearance CLOUDY (*) CLEAR   Specific Gravity, Urine 1.035 (*) 1.005 - 1.030   pH 5.5  5.0 - 8.0   Glucose, UA >1000 (*) NEGATIVE mg/dL   Hgb urine dipstick NEGATIVE  NEGATIVE   Bilirubin Urine LARGE (*) NEGATIVE   Ketones, ur 40 (*) NEGATIVE mg/dL   Protein, ur >300 (*) NEGATIVE mg/dL   Urobilinogen, UA 1.0  0.0 - 1.0 mg/dL   Nitrite NEGATIVE  NEGATIVE   Leukocytes, UA NEGATIVE  NEGATIVE   Bacteria, UA FEW (*) RARE   Squamous Epithelial / LPF RARE  RARE   Casts GRANULAR CAST (*) NEGATIVE  URINE RAPID DRUG SCREEN (HOSP PERFORMED)     Status: Abnormal   Collection Time   07/12/11 12:38 AM      Component Value Range   Opiates POSITIVE (*) NONE DETECTED   Cocaine NONE DETECTED  NONE DETECTED   Benzodiazepines NONE DETECTED  NONE DETECTED   Amphetamines NONE DETECTED  NONE DETECTED   Tetrahydrocannabinol NONE DETECTED  NONE DETECTED   Barbiturates NONE DETECTED  NONE DETECTED  GLUCOSE, CAPILLARY     Status: Normal   Collection Time   07/12/11  6:41 AM      Component Value Range   Glucose-Capillary 70  70 - 99 mg/dL   Comment 1 Notify RN     Comment 2 Documented in Chart      Imaging results:  Dg Foot Complete Right  07/11/2011  *RADIOLOGY REPORT*  Clinical Data: Draining lesions between the fourth and fifth toes. History of diabetes.  RIGHT FOOT COMPLETE - 3+ VIEW  Comparison: 06/03/2003  Findings: Bones appear osteopenic.  There is  no evidence for acute fracture or subluxation.  No radiopaque foreign body or soft tissue gas identified.  Soft tissue swelling is noted in the region of the fifth metatarsal phalangeal  joint.  Note is made of plantar and Achilles calcaneal spurs.  IMPRESSION:  1.  Significant soft tissue swelling. 2.  No evidence for fracture or foreign body.  Original Report Authenticated By: Glenice Bow, M.D.    Assessment and Plan: I agree with the formulated Assessment and Plan with the following changes: 74 yr.old AAM w/ hx uncontrolled IDDM type 2, HTN, morbid obesity with BMI 40, with reported acute nontraumatic wound on plantar aspect of right foot concerning for diabetic ulcer and possibly underlying osteomyelitis, with recent fall and worsening back pain, in addition to N/V since initiating Marcrobid. -Diabetic foot ulcer: I suspect this is chronic and only recently became infected. He has uncontrolled DM and admits to skipping insulin doses. Continue Vanc and Zosyn for now. MRI of the foot to r/o underlying osteomyelitis. Order ESR. BC taken. Leukocytosis noted. Will obtain ortho consult if evidence of osteomyelitis. -Acute lumbar strain: I doubt underlying fracture, but patient states worsening pain that is significant with movement. Obtain lumbar x-ray to r/o fracture. Flexeril 5 mg PO tid PRN. May use Norco for pain PRN as well. -Nausea and vomiting: noted ketones in urine in addition to granular casts in urine. IVF with NS for now. I suspect secondary to recent PO antibiotic. Zofran for now. -IDDM type 2: HbA1C of 11.8%, this is terribly uncontrolled. Educated him on use of insulin and complications of diabetes, including foot ulcers. Continue Novolog and SS. -HTN: add low dose lisinopril. -hx DVT and PE: Need to review records. If he has had multiple occurrences then he will need lifelong anticoagulation. He has history of nonadherence to therapy and coumadin may not be best choice for him due to need for monitoring. Xarelto may not be affordable. He does not seem inclined to accepting Lovenox treatment on my interview. Will need to consult Hematology for their input. -Rest per  resident physician note.  Dominic Pea, DO 7/3/201310:03 AM

## 2011-07-13 DIAGNOSIS — E119 Type 2 diabetes mellitus without complications: Secondary | ICD-10-CM

## 2011-07-13 DIAGNOSIS — Z86718 Personal history of other venous thrombosis and embolism: Secondary | ICD-10-CM

## 2011-07-13 LAB — GLUCOSE, CAPILLARY
Glucose-Capillary: 119 mg/dL — ABNORMAL HIGH (ref 70–99)
Glucose-Capillary: 150 mg/dL — ABNORMAL HIGH (ref 70–99)
Glucose-Capillary: 176 mg/dL — ABNORMAL HIGH (ref 70–99)

## 2011-07-13 LAB — HOMOCYSTEINE: Homocysteine: 9 umol/L (ref 4.0–15.4)

## 2011-07-13 MED ORDER — DOXYCYCLINE HYCLATE 100 MG PO TABS
100.0000 mg | ORAL_TABLET | Freq: Two times a day (BID) | ORAL | Status: AC
  Administered 2011-07-13 (×2): 100 mg via ORAL
  Filled 2011-07-13 (×2): qty 1

## 2011-07-13 MED ORDER — DOXYCYCLINE HYCLATE 100 MG PO TABS: 100.0000 mg | ORAL_TABLET | Freq: Every day | ORAL | Status: DC

## 2011-07-13 MED ORDER — DOXYCYCLINE HYCLATE 100 MG PO TABS
100.0000 mg | ORAL_TABLET | Freq: Two times a day (BID) | ORAL | Status: DC
  Administered 2011-07-14 – 2011-07-15 (×3): 100 mg via ORAL
  Filled 2011-07-13 (×4): qty 1

## 2011-07-13 MED ORDER — ASPIRIN EC 81 MG PO TBEC
162.0000 mg | DELAYED_RELEASE_TABLET | Freq: Every day | ORAL | Status: DC
  Administered 2011-07-13 – 2011-07-15 (×3): 162 mg via ORAL
  Filled 2011-07-13 (×3): qty 2

## 2011-07-13 MED ORDER — ENOXAPARIN SODIUM 80 MG/0.8ML ~~LOC~~ SOLN
70.0000 mg | Freq: Every day | SUBCUTANEOUS | Status: DC
  Administered 2011-07-13: 70 mg via SUBCUTANEOUS
  Filled 2011-07-13 (×2): qty 0.8

## 2011-07-13 NOTE — Progress Notes (Signed)
ANTICOAGULATION CONSULT NOTE - Initial Consult  Pharmacy Consult for Lovenox Indication: VTE prophylaxis  No Known Allergies  Patient Measurements: Height: 6\' 1"  (185.4 cm) Weight: 307 lb 8.7 oz (139.5 kg) IBW/kg (Calculated) : 79.9   Labs:  Basename 07/12/11 1421 07/11/11 1940  HGB -- 13.7  HCT -- 38.8*  PLT -- 302  APTT -- --  LABPROT -- --  INR -- --  HEPARINUNFRC -- --  CREATININE 0.70 0.80  CKTOTAL -- --  CKMB -- --  TROPONINI -- --    Estimated Creatinine Clearance: 190.8 ml/min (by C-G formula based on Cr of 0.7).  Assessment: 36 y.o male on lovenox 40mg  SQ for VTE prophylaxis. Pt with BMI=40. Lovenox needs adjustment for BMI > 30.  Goal of Therapy:  Prevention of VTE   Plan:  1. Will change Lovenox to 70mg  SQ q24h (0.5mg /kg) for obese pt 2. Will continue to follow pt for antibiotic dosing and adjust lovenox further if needed.  Sherlon Handing, PharmD, BCPS Clinical pharmacist, pager [phone] 07/13/2011,8:33 AM

## 2011-07-13 NOTE — Progress Notes (Signed)
Internal Medicine Teaching Service Attending Note Date: 07/13/2011  Patient name: [name]  Medical record number: [id number]  Date of birth: 12-Jul-1975    This patient has been seen and discussed with the house staff. Please see their note for complete details. I concur with their findings with the following additions/corrections: -Diabetic ulcer: switch to oral doxycycline. Pending MRI final read, but doubt underlying osteo. Will need good outpatient wound care. -hx DVTs/PE: Consult hematology for recs. He has history of skipping appointments to clinic, so giving him coumadin may not be best option. He seems today to agree to Lovenox. Will need hematology input to decide on final treatment. -Back pain: Improved on medications, likely MS. No evidence of fracture on lumbar spine film. - rest per resident physician note.  Dominic Pea 07/13/2011, 5:24 PM

## 2011-07-13 NOTE — Consult Note (Signed)
[name], [name] NO.:  [id number]  MEDICAL RECORD NO.:  [id number]  LOCATION:  3017                         FACILITY:  [facility]  PHYSICIAN:  Volanda Napoleon, M.D.  DATE OF BIRTH:  28-Sep-1975  DATE OF CONSULTATION:  07/13/2011 DATE OF DISCHARGE:                                CONSULTATION   REFERRING PHYSICIANS:  Dominic Pea, DO  REASON FOR CONSULTATION:  Recurrent DVTs.  HISTORY OF PRESENT ILLNESS:  [name] is a 36 year old African American gentleman.  He has had history of diabetes.  He is insulin dependent.  He has had diabetes for about I think 8 years or so.  He was admitted because of diabetic ulcer on his right foot.  He has a history of recurrent DVTs.  His first one was back in 2004.  At that time, he says he also had a blood clot in his lung.  He then had a second one in 2006.  He then had a third episode of DVT in 2011.  He said he was on Coumadin 6 months after his last DVT.  He says his blood clots in his left leg.  He told me that it was recommended that he go on lifelong blood thinner. However, he said he does not want to do that because he lives an active lifestyle.  He is working for [employer].  He says he does martial arts. He does try to do recreational sports.  He said he is on blood thinner, this would impact his activities negatively.  Going through his chart, he has had a hypercoagulable workup in the past.  Back in 2011, hypercoagulable panel was done.  All of his tests came back normal.  He did have an elevated IgA, anticardiolipin antibody.  This was mildly elevated.  Again, I think he is now admitted because of diabetic ulcer in his right foot.  He is on IV antibiotics.  We were asked to see him to see what we recommend for long-term anticoagulation.  Again, he is not going to take blood thinner for life what he tells me. He just does not want to have his activities curtailed.  He has had no cough.  There is no  hemoptysis.  He has had no abdominal pain.  He has had no weight loss or weight gain.  There has been no change in bowel or bladder habits.  He has had no rashes.  He has had no history of surgeries before.  He says that when he has had his blood clots, they just seem to appear all of a sudden.  He has had no fever.  He has had no dysphasia or odynophagia.  There is no family history of blood clots.  There is no family history of sickle cell or other blood issues.  PAST MEDICAL HISTORY:  Remarkable for: 1. Insulin-dependent diabetes. 2. Hypertension. 3. Morbid obesity. 4. Recurrent thromboembolic disease.  ALLERGIES:  None.  MEDICATIONS: 1. Insulin (70/30) 50 units subcu b.i.d. 2. Metformin 1000 mg p.o. b.i.d. 3. Macrobid 100 mg p.o. b.i.d. 4. Phenergan as needed q.6 hours via suppository.  SOCIAL HISTORY:  Negative for tobacco use.  He really has  rare alcohol use.  He does work for [employer].  He says he is on his feet quite a lot. He does have compression stockings for his legs.  FAMILY HISTORY:  Noncontributory.  REVIEW OF SYSTEMS:  As stated in the history of present illness.  No additional findings were noted on 12-system review.  PHYSICAL EXAMINATION:  GENERAL:  This is a morbidly obese, black gentleman in no obvious distress. VITAL SIGNS:  Temperature 98.3, pulse 79, respiratory rate 20, blood pressure 121/68, weight is 307 pounds. HEAD AND NECK:  Normocephalic, atraumatic skull.  There are no ocular or oral lesions.  There are no palpable cervical or supraclavicular lymph nodes. LUNGS:  With some decrease breath sounds at bases. CARDIAC:  Regular rate and rhythm with a normal S1, S2.  There are no murmurs, rubs, or bruits. ABDOMEN:  Soft, good bowel sounds.  He is morbidly obese.  There is no fluid wave.  There is no palpable abdominal mass.  There is no palpable hepatosplenomegaly. BACK:  Shows no tenderness over the spine, ribs, or hips. EXTREMITIES:  Shows  dressing on the bottom of his right foot.  He has no swelling in his lower legs.  He has no palpable venous cord in his lower legs.  He has good range of motion of his joints. SKIN:  No rashes. NEUROLOGICAL:  No focal neurological deficits.  LABORATORY STUDIES:  White cell count 11.2, hemoglobin 13.7, hematocrit 38.8, platelet count 302.  MCV is 83.  BUN and creatinine are 9 and 0.7. C-reactive protein is 8.07.  His liver function tests are okay.  IMPRESSION:  [name] is a 36 year old gentleman with insulin- dependent diabetes.  He is morbidly obese.  He had history of recurrent thromboembolic disease.  He has had a hypercoagulable workup, which was all negative. He is very reluctant to do any long-acting anticoagulation because of his lifestyle.  As such, I believe a good and reasonable option would be aspirin.  I put him on 162 mg of aspirin a day.  I think this might be able to provide some degree of benefit with respect to anticoagulation.  Thankful, he does not smoke.  He does have diabetes, which is risk factor that cannot be altered.  [name] clearly understands that he is at significant risk for another thromboembolic event.  He just wants to maintain a decent quality of life.  I think it would be interesting to do Dopplers of his legs.  It appears that he has had problems with recurrence of clot in the left leg.  He may have some anatomic issue that might predispose him to thromboembolic event.  I do not see the need for any kind of filter placement.  Again, I think baby aspirin would not be a bad idea for him.  I would start him on 162 mg p.o. daily.  I would make sure that there is no plans for any invasive studies before I started him on the aspirin.  [name] is a nice guy.  He just has a tough problem with the diabetes.  Clearly, long-term, the diabetes is going to be much more of an issue than thromboembolic events from my point of view.  There is not  much else that we really need.  I did order a homocysteine level on him.  I think this would be interest to see what his homocysteine level is.  If elevated, I would have him to take folic acid.  I think this would be  a simple and also effective means of reducing his risk of a thromboembolic occurrence.  I spent a good hour or so with Mr. Mcardle today.  I answered all of his questions.  We will certainly pray hard for him.     Volanda Napoleon, M.D.     PRE/MEDQ  D:  07/13/2011  T:  07/13/2011  Job:  [id number]

## 2011-07-13 NOTE — Consult Note (Signed)
#   [id number] is the dictated note.  Would check homocysteine level.  He had thrombophilic panel done in [id number], so this does not need to be repeated. If he has elevated homocysteine level, I would start him on Folic acid - 1 mg po q day.  He does NOT want long term blood thinner due to negative impact on his QOL.  I would therefore start him on ASA at 162mg  po qd. He MUST take this with food. Make sure that he has enteric coated ASA.  Check Dopplers of his legs. He says that his DVT start in his LEFT leg.  Thanks!!  Waldemar Dickens 55:22
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
## 2011-07-14 ENCOUNTER — Other Ambulatory Visit: Payer: Self-pay | Admitting: Internal Medicine

## 2011-07-14 DIAGNOSIS — R609 Edema, unspecified: Secondary | ICD-10-CM

## 2011-07-14 LAB — WOUND CULTURE: Gram Stain: NONE SEEN

## 2011-07-14 LAB — GLUCOSE, CAPILLARY
Glucose-Capillary: 140 mg/dL — ABNORMAL HIGH (ref 70–99)
Glucose-Capillary: 165 mg/dL — ABNORMAL HIGH (ref 70–99)

## 2011-07-14 LAB — PROTIME-INR: INR: 1.02 (ref 0.00–1.49)

## 2011-07-14 MED ORDER — SENNOSIDES-DOCUSATE SODIUM 8.6-50 MG PO TABS
1.0000 | ORAL_TABLET | Freq: Two times a day (BID) | ORAL | Status: DC
  Administered 2011-07-14 – 2011-07-15 (×4): 1 via ORAL
  Filled 2011-07-14 (×5): qty 1

## 2011-07-14 MED ORDER — POLYETHYLENE GLYCOL 3350 17 G PO PACK
17.0000 g | PACK | Freq: Two times a day (BID) | ORAL | Status: DC
  Administered 2011-07-14 – 2011-07-15 (×3): 17 g via ORAL
  Filled 2011-07-14 (×7): qty 1

## 2011-07-14 MED ORDER — MAGNESIUM HYDROXIDE 400 MG/5ML PO SUSP
15.0000 mL | Freq: Every day | ORAL | Status: DC
  Administered 2011-07-14: 15 mL via ORAL
  Administered 2011-07-15: 10:00:00 via ORAL
  Filled 2011-07-14 (×4): qty 30

## 2011-07-14 MED ORDER — WARFARIN SODIUM 10 MG PO TABS
10.0000 mg | ORAL_TABLET | Freq: Once | ORAL | Status: AC
  Administered 2011-07-14: 10 mg via ORAL
  Filled 2011-07-14: qty 1

## 2011-07-14 MED ORDER — ENOXAPARIN SODIUM 150 MG/ML ~~LOC~~ SOLN
140.0000 mg | Freq: Two times a day (BID) | SUBCUTANEOUS | Status: DC
  Administered 2011-07-14 – 2011-07-17 (×7): 140 mg via SUBCUTANEOUS
  Filled 2011-07-14 (×10): qty 1

## 2011-07-14 MED ORDER — DOCUSATE SODIUM 283 MG RE ENEM
1.0000 | ENEMA | Freq: Once | RECTAL | Status: DC
  Administered 2011-07-14: 283 mg via RECTAL
  Filled 2011-07-14: qty 1

## 2011-07-14 MED ORDER — SODIUM CHLORIDE 0.9 % IV SOLN
INTRAVENOUS | Status: AC
  Administered 2011-07-14 – 2011-07-15 (×2): via INTRAVENOUS

## 2011-07-14 MED ORDER — SENNOSIDES-DOCUSATE SODIUM 8.6-50 MG PO TABS: 1.0000 | ORAL_TABLET | Freq: Two times a day (BID) | ORAL | Status: DC

## 2011-07-14 MED ORDER — WARFARIN - PHARMACIST DOSING INPATIENT
Freq: Every day | Status: DC
  Administered 2011-07-17: 18:00:00

## 2011-07-14 MED ORDER — SENNA 8.6 MG PO TABS
2.0000 | ORAL_TABLET | Freq: Once | ORAL | Status: DC
  Filled 2011-07-14: qty 2

## 2011-07-14 NOTE — Progress Notes (Signed)
Pt has been complaining of nausea throughout night.  Zofran given at 2338 last night and nausea was relieved.  This AM patient attempted to have a bowel movement but was unable to (last BM on 6/30).  When pt returned to bed he became nauseous and vomited around 200cc of green emesis.  Pt refused more Zofran.
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
## 2011-07-14 NOTE — Progress Notes (Signed)
VASCULAR LAB PRELIMINARY  PRELIMINARY  PRELIMINARY  PRELIMINARY  Bilateral lower extremity venous duplex completed.    Preliminary report:  No evidence of right lower extremity DVT, superficial thrombosis, or Baker's cyst                                 Left - Small chronic DVT noted in the mid to distal popliteal vein. All other veins imaged were free of DVT. There is no evidence of superficial thrombosis or Baker's cyst  Eddi Hymes, RVS  07/14/2011, 1:15 PM
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
## 2011-07-14 NOTE — Progress Notes (Signed)
INTERNAL MEDICINE TEACHING SERVICE Attending Note  Date: 07/14/2011  Patient name: [name]  Medical record number: [id number]  Date of birth: [birth year]    This patient has been seen and discussed with the house staff. Please see their note for complete details. I concur with their findings with the following additions/corrections: [name] complains of nausea and one episode of emesis this morning. He admits to no BM since Sunday and has diffuse abdominal discomfort as a result. Denies any correlation with any medication he is taking and nausea. Admits to decreased appetite. No CP, no SOB. A/P: 38 yr. Old AAM w/ IDDM type 2, HTN, history of multiple DVT's reported by patient in addition to PE, morbid obesity with BMI 40, presented with back pain s/p mechanical fall and malodorous right draining foot wound on plantar aspect of fifth toe, with nauea/vomiting/constipation. 1) Right foot diabetic ulcer/cellulitis: MRI without evidence of osteomyelitis. He will need diabetic shoes and frequent foot exams by podiatry, discussed with patient. Due to DM he is at risk for progression, reoccurrence, and complications of diabetic foot infections which can proceed to need amputation. I explained this to him and he understands. Close follow up and daily foot exams by him are extremely important in order to report ulcers/lesions to his healthcare providers. Would treat with 10-14 days of doxycycline and await any culture data. Clinically improving. He will need wound care at discharge. 2) IDDM type 2: Admits to non-adherence to medical therapy. Stressed importance of following our medical advice, follow up as well. Educated on complications of diabetes mellitus if he is non-adherent to therapy. 3) HTN: Elevated at this time secondary to pain likely. Continue current dose of lisinopril. 4) N/V/Constipation: Continue symptomatic treatment. No guarding or rebound tenderness on exam. This may be secondary to  narcotic use, constipation, or doxycycline. He denies any specific medication causing this. If continues, will need to stop all narcotic meds, replace doxy with another abx, and consider underlying gastroparesis as he has complained of this prior to coming to our hospital. Agree with bowel regimen for BM. 5) hx DVT/PE: Will defer to hematology. They recommend ASA. Discussed with patient risks and benefits of this. He understands ideally coumadin and close monitoring is recommended, but he does not want a chronic long term blood thinner. Understands risks involved with this.  -rest per resident physician note.   Dominic Pea, DO  07/14/2011, 11:49 AM

## 2011-07-14 NOTE — Progress Notes (Signed)
ANTICOAGULATION CONSULT NOTE - Initial Consult  Pharmacy Consult for Lovenox --> Coumadin Indication: DVT (chronic LLE DVT, hx multiple DVT/PE)  No Known Allergies  Patient Measurements: Height: 6\' 1"  (185.4 cm) Weight: 301 lb 1 oz (136.561 kg) IBW/kg (Calculated) : 79.9   Vital Signs: Temp: 97.9 F (36.6 C) (07/05 1515) Temp src: Oral (07/05 1515) BP: 135/84 mmHg (07/05 1515) Pulse Rate: 81  (07/05 1515)  Labs:  Basename 07/12/11 1421 07/11/11 1940  HGB -- 13.7  HCT -- 38.8*  PLT -- 302  APTT -- --  LABPROT -- --  INR -- --  HEPARINUNFRC -- --  CREATININE 0.70 0.80  CKTOTAL -- --  CKMB -- --  TROPONINI -- --    Estimated Creatinine Clearance: 188.8 ml/min (by C-G formula based on Cr of 0.7).   Medical History: Past Medical History  Diagnosis Date  . Diabetes mellitus   . DVT (deep venous thrombosis) 09/2002    patient reports additional DVTs in '06 & '11 (unconfirmed)  . History of hypertension     resolved after weight loss  . Pulmonary embolism 09/2002    treated with 6 months of warfarin    Medications:  Prescriptions prior to admission  Medication Sig Dispense Refill  . insulin NPH-insulin regular (NOVOLIN 70/30) (70-30) 100 UNIT/ML injection Inject 50 Units into the skin 2 (two) times daily with a meal.      . metFORMIN (GLUCOPHAGE) 500 MG tablet Take 1,000 mg by mouth 2 (two) times daily with a meal.      . nitrofurantoin (MACRODANTIN) 100 MG capsule Take 100 mg by mouth 2 (two) times daily.      . promethazine (PHENERGAN) 12.5 MG suppository Place 12.5 mg rectally every 6 (six) hours as needed. For nausea.        Assessment: 36 yo M with hx of DVT/PE and chronic DVT in LLE to start full-dose Lovenox and Coumadin for anticoagulation.  Per Heme note, pt has had a negative hypercoag work-up in the past.  Patient is very hesitant to starting lifelong anticoagulation secondary to his active lifestyle.  Will defer this decision and further discussion with  the patient to the primary team.  Goal of Therapy:  INR 2-3 Anti-Xa level 0.6-1.2 units/ml 4hrs after LMWH dose given Monitor platelets by anticoagulation protocol: Yes   Plan:  Check baseline INR now. Begin Lovenox 140 mg SQ Q12h - first dose tonight. Coumadin 10 mg PO x 1 tonight (assuming baseline INR <1.5) Daily INR.  [employer], Pharm.D., BCPS Clinical Pharmacist Pager [phone] 07/14/2011 5:05 PM

## 2011-07-15 LAB — GLUCOSE, CAPILLARY
Glucose-Capillary: 216 mg/dL — ABNORMAL HIGH (ref 70–99)
Glucose-Capillary: 151 mg/dL — ABNORMAL HIGH (ref 70–99)
Glucose-Capillary: 62 mg/dL — ABNORMAL LOW (ref 70–99)
Glucose-Capillary: 68 mg/dL — ABNORMAL LOW (ref 70–99)

## 2011-07-15 LAB — PROTIME-INR
INR: 1.04 (ref 0.00–1.49)
Prothrombin Time: 13.8 seconds (ref 11.6–15.2)

## 2011-07-15 MED ORDER — INSULIN ASPART PROT & ASPART (70-30 MIX) 100 UNIT/ML ~~LOC~~ SUSP: 30.0000 [IU] | Freq: Every day | SUBCUTANEOUS | Status: DC

## 2011-07-15 MED ORDER — ACETAMINOPHEN 500 MG PO TABS
1000.0000 mg | ORAL_TABLET | Freq: Four times a day (QID) | ORAL | Status: DC | PRN
  Filled 2011-07-15: qty 2

## 2011-07-15 MED ORDER — DEXTROSE 50 % IV SOLN
25.0000 mL | Freq: Once | INTRAVENOUS | Status: AC
  Administered 2011-07-16: 25 mL via INTRAVENOUS
  Filled 2011-07-15: qty 50

## 2011-07-15 MED ORDER — HYDROCODONE-ACETAMINOPHEN 5-325 MG PO TABS: 1.0000 | ORAL_TABLET | Freq: Four times a day (QID) | ORAL | Status: DC | PRN

## 2011-07-15 MED ORDER — INSULIN ASPART PROT & ASPART (70-30 MIX) 100 UNIT/ML ~~LOC~~ SUSP
35.0000 [IU] | Freq: Every day | SUBCUTANEOUS | Status: DC
  Administered 2011-07-17: 35 [IU] via SUBCUTANEOUS
  Filled 2011-07-15: qty 3

## 2011-07-15 MED ORDER — WARFARIN SODIUM 10 MG PO TABS
10.0000 mg | ORAL_TABLET | Freq: Once | ORAL | Status: AC
  Administered 2011-07-15: 10 mg via ORAL
  Filled 2011-07-15: qty 1

## 2011-07-15 MED ORDER — IBUPROFEN 400 MG PO TABS
400.0000 mg | ORAL_TABLET | Freq: Four times a day (QID) | ORAL | Status: DC | PRN
  Administered 2011-07-17: 400 mg via ORAL
  Filled 2011-07-15: qty 1

## 2011-07-15 MED ORDER — CEPHALEXIN 500 MG PO CAPS
1000.0000 mg | ORAL_CAPSULE | Freq: Two times a day (BID) | ORAL | Status: DC
  Administered 2011-07-15 – 2011-07-16 (×2): 1000 mg via ORAL
  Filled 2011-07-15 (×3): qty 2

## 2011-07-15 MED ORDER — LISINOPRIL 10 MG PO TABS
10.0000 mg | ORAL_TABLET | Freq: Every day | ORAL | Status: DC
  Administered 2011-07-16 – 2011-07-17 (×2): 10 mg via ORAL
  Filled 2011-07-15 (×2): qty 1

## 2011-07-15 MED ORDER — ENOXAPARIN (LOVENOX) PATIENT EDUCATION KIT
PACK | Freq: Once | Status: AC
  Administered 2011-07-15: 08:00:00
  Filled 2011-07-15: qty 1

## 2011-07-15 NOTE — Progress Notes (Signed)
Nurse reports that Blood glucose was low at 62 and patient received OJ X2 without much improvement. CBG was repeated at 64 and the patient was unable to drink more Oj due to Nausea. Patient otherwise asymptomatic. D50 1/2 ampule IV X 1 dose ordered. Of note, patient received insulin 70/30 units at 1740hr

## 2011-07-15 NOTE — Progress Notes (Signed)
Tolerated 500 ml of soap suds enema, up to BR, passing flatus, enema water and stool.
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
## 2011-07-15 NOTE — Progress Notes (Signed)
3rd emesis today, gastric secretions. Cont nausea

## 2011-07-15 NOTE — Progress Notes (Signed)
Reviewed lovenox administration with pt. Pt demonstrated accurate dose identification, site identification, and injection technique. Safety needle post adm coverage also demonstrated. Pt admits to being in a funk, does not want to discuss.

## 2011-07-15 NOTE — Progress Notes (Signed)
Pt stated he felt nauseated. Pt then had a yellow emesis (unable to measure). Pt also stated he felt constipated and that he had not had a bowel movement since Sunday, June 30. Md on call made aware. However, pt refused anything at this time to help him with is nausea and constipation. Will cont to monitor pt.

## 2011-07-15 NOTE — Progress Notes (Signed)
Patient blood sugar 62, drank one orange juice refused to eat due to nausea but declined zofran.  Recheck of blood sugar 68, advised patient to drink another orange juice and offered saltine cracker which he declined.  Will recheck blood sugar again and if not increased page on call. [place] Faster

## 2011-07-15 NOTE — Progress Notes (Signed)
INTERNAL MEDICINE TEACHING SERVICE Attending Note  Date: 07/15/2011  Patient name: [name]  Medical record number: [id number]  Date of birth: [birth year]    This patient has been seen and discussed with the house staff. Please see their note for complete details. I concur with their findings with the following additions/corrections: He was able to have a BM today. He admits to some nausea that has only mildly improved. No CP, no SOB. His wound is looking much better and not draining as much.  A/P: 37 yr. Old AAM w/ IDDM type 2, HTN, history of multiple DVT's reported by patient in addition to PE, morbid obesity with BMI 40, presented with back pain s/p mechanical fall and malodorous right draining foot wound on plantar aspect of fifth toe, with nauea/vomiting/constipation.  1) Right foot diabetic ulcer/cellulitis: MRI without evidence of osteomyelitis. He will need diabetic shoes and frequent foot exams by podiatry, discussed with patient. Due to DM he is at risk for progression, reoccurrence, and complications of diabetic foot infections which can proceed to need amputation. I explained this to him and he understands. Close follow up and daily foot exams by him are extremely important in order to report ulcers/lesions to his healthcare providers. Would treat with 10 days of Keflex given Group B strep from culture.Clinically improving. He will need wound care at discharge.  2) IDDM type 2: Admits to non-adherence to medical therapy. Stressed importance of following our medical advice, follow up as well. Educated on complications of diabetes mellitus if he is non-adherent to therapy.  3) HTN: Increase lisinopril to 10 mg daily. 4) N/V/Constipation: He had a BM today. If continues to have persistent N/V, would recommend GES after a washout period of no narcotics. Doxy can also cause nausea, observe on keflex. 5) hx DVT/PE: Noted to have chronic DVT on doppler u/s. He agrees to lovenox and coumadin  and the monitoring that is associated with this. He understands the risks of bleeding if he does not allow his PCP to monitor his INR with coumadin use and the risk of not taking coumadin and having repeat, possibly fatal, PE. Will need close INR following at least 3 days post discharge given antibiotic use. -rest per resident physician note.   Dominic Pea, DO  07/15/2011, 6:11 PM

## 2011-07-15 NOTE — Progress Notes (Signed)
Dressing right 5th toe removed, purulent drainage to gauze wrap. Cleansed with normal saline. Folded 2x2 gauze placed between 4th and 5th toe and under 5th toe. Wrapped with 1 inch gauze and secured around 4th and 5th toe.

## 2011-07-15 NOTE — Progress Notes (Signed)
Pt asks to delay soap suds enema, since he is having family visit soon

## 2011-07-15 NOTE — Progress Notes (Signed)
ANTICOAGULATION CONSULT NOTE - Initial Consult  Pharmacy Consult for Lovenox --> Coumadin Indication: DVT (chronic LLE DVT, hx multiple DVT/PE)  No Known Allergies  Patient Measurements: Height: 6\' 1"  (185.4 cm) Weight: 303 lb 2.1 oz (137.5 kg) IBW/kg (Calculated) : 79.9   Vital Signs: Temp: 98.2 F (36.8 C) (07/06 0500) Temp src: Oral (07/06 0500) BP: 155/94 mmHg (07/06 0500) Pulse Rate: 91  (07/06 0500)  Labs:  Basename 07/15/11 0620 07/14/11 1705 07/12/11 1421  HGB -- -- --  HCT -- -- --  PLT -- -- --  APTT -- -- --  LABPROT 13.8 13.6 --  INR 1.04 1.02 --  HEPARINUNFRC -- -- --  CREATININE -- -- 0.70  CKTOTAL -- -- --  CKMB -- -- --  TROPONINI -- -- --    Estimated Creatinine Clearance: 189.4 ml/min (by C-G formula based on Cr of 0.7).   Medical History: Past Medical History  Diagnosis Date  . Diabetes mellitus   . DVT (deep venous thrombosis) 09/2002    patient reports additional DVTs in '06 & '11 (unconfirmed)  . History of hypertension     resolved after weight loss  . Pulmonary embolism 09/2002    treated with 6 months of warfarin    Medications:  Prescriptions prior to admission  Medication Sig Dispense Refill  . insulin NPH-insulin regular (NOVOLIN 70/30) (70-30) 100 UNIT/ML injection Inject 50 Units into the skin 2 (two) times daily with a meal.      . metFORMIN (GLUCOPHAGE) 500 MG tablet Take 1,000 mg by mouth 2 (two) times daily with a meal.      . nitrofurantoin (MACRODANTIN) 100 MG capsule Take 100 mg by mouth 2 (two) times daily.      . promethazine (PHENERGAN) 12.5 MG suppository Place 12.5 mg rectally every 6 (six) hours as needed. For nausea.        Assessment: 36 yo M with hx of DVT/PE and chronic DVT in LLE to start full-dose Lovenox and Coumadin for anticoagulation.  Per Heme note, pt has had a negative hypercoag work-up in the past.  Patient is very hesitant to starting lifelong anticoagulation secondary to his active lifestyle.  Will  defer this decision and further discussion with the patient to the primary team.  Goal of Therapy:  INR 2-3 Anti-Xa level 0.6-1.2 units/ml 4hrs after LMWH dose given Monitor platelets by anticoagulation protocol: Yes   Plan:  Cont Lovenox 140 mg SQ Q12h  Coumadin 10 mg PO x 1 today Daily INR.  [name], Pharm.D. Clinical Pharmacist Pager [phone] 07/15/2011 8:35 AM

## 2011-07-16 LAB — PROTIME-INR: INR: 1.12 (ref 0.00–1.49)

## 2011-07-16 MED ORDER — LISINOPRIL 10 MG PO TABS: 10.0000 mg | ORAL_TABLET | Freq: Every day | ORAL | Status: DC

## 2011-07-16 MED ORDER — CEPHALEXIN 500 MG PO CAPS
1000.0000 mg | ORAL_CAPSULE | Freq: Once | ORAL | Status: AC
  Administered 2011-07-16: 1000 mg via ORAL
  Filled 2011-07-16: qty 2

## 2011-07-16 MED ORDER — ENOXAPARIN SODIUM 100 MG/ML ~~LOC~~ SOLN: 140.0000 mg | Freq: Two times a day (BID) | SUBCUTANEOUS | Status: DC

## 2011-07-16 MED ORDER — WARFARIN SODIUM 5 MG PO TABS: 12.5000 mg | ORAL_TABLET | Freq: Every day | ORAL | Status: DC

## 2011-07-16 MED ORDER — CEPHALEXIN 500 MG PO CAPS: 1000.0000 mg | ORAL_CAPSULE | Freq: Two times a day (BID) | ORAL | Status: DC

## 2011-07-16 MED ORDER — ONDANSETRON HCL 8 MG PO TABS: 8.0000 mg | ORAL_TABLET | Freq: Three times a day (TID) | ORAL | Status: AC | PRN

## 2011-07-16 MED ORDER — DEXTROSE 50 % IV SOLN
INTRAVENOUS | Status: AC
  Administered 2011-07-16: 25 mL via INTRAVENOUS
  Filled 2011-07-16: qty 50

## 2011-07-16 MED ORDER — WARFARIN SODIUM 2.5 MG PO TABS
12.5000 mg | ORAL_TABLET | Freq: Once | ORAL | Status: AC
  Administered 2011-07-16: 12.5 mg via ORAL
  Filled 2011-07-16: qty 1
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
## 2011-07-16 NOTE — Progress Notes (Signed)
CRITICAL VALUE ALERT  Critical value received:  CBG 64  Date of notification: 07/15/2011  Time of notification:  2310  Critical value read back:yes  Nurse who received alert:  Tonye Royalty, RN  MD notified (1st page):  Internal Medicine : Dr. Jessee Avers   Time of first page:  2315  MD notified (2nd page):  Time of second page:  Responding MD:  Alice Rieger   Time MD responded:  2316

## 2011-07-16 NOTE — Progress Notes (Signed)
Pt.'s BS=127 this am. 70/30 insulin 35 units held this am.Pt. Only ate 2 slices of bacon on am breakfast tray. Pt. Aware and request to hold 70/30 insulin dose @ this time.

## 2011-07-16 NOTE — Progress Notes (Signed)
INTERNAL MEDICINE TEACHING SERVICE Attending Note  Date: 07/16/2011  Patient name: [name]  Medical record number: [id number]  Date of birth: 16-Jul-1975    This patient has been seen and discussed with the house staff. Please see their note for complete details. I concur with their findings with the following additions/corrections: Feels well this AM. Admits to persistent nausea, but states this is improved. No further BM's.  He was able to tolerate breakfast. A/P: 43 yr. Old AAM w/ IDDM type 2, HTN, history of multiple DVT's reported by patient in addition to PE, morbid obesity with BMI 40, presented with back pain s/p mechanical fall and malodorous right draining foot wound on plantar aspect of fifth toe, with nauea/vomiting/constipation.  1) Right foot diabetic ulcer/cellulitis: MRI without evidence of osteomyelitis. He will need diabetic shoes and frequent foot exams by podiatry, discussed with patient. Due to DM he is at risk for progression, reoccurrence, and complications of diabetic foot infections which can proceed to need amputation. I explained this to him and he understands. Close follow up and daily foot exams by him are extremely important in order to report ulcers/lesions to his healthcare providers. Would treat with 10 days of Keflex given Group B strep from culture.Clinically improving. He will need wound care at discharge. Also discussed Podiatry follow up. 2) IDDM type 2: Admits to non-adherence to medical therapy. Stressed importance of following our medical advice, follow up as well. Educated on complications of diabetes mellitus if he is non-adherent to therapy.  3) HTN: lisinopril to 10 mg daily.  4) N/V/Constipation: He had a BM and tolerated breakfast. If continues to have persistent N/V, would recommend GES after a washout period of no narcotics. Doxy can also cause nausea, observe on keflex. He can return or notify PCP if persistent nausea does not resolve.  5) hx  DVT/PE: Noted to have chronic DVT on doppler u/s. He agrees to lovenox and coumadin and the monitoring that is associated with this. He understands the risks of bleeding if he does not allow his PCP to monitor his INR with coumadin use and the risk of not taking coumadin and having repeat, possibly fatal, PE. Will need close INR following at least 3 days post discharge given antibiotic use.  -rest per resident physician note.    Dominic Pea, DO  07/16/2011, 1:47 PM

## 2011-07-16 NOTE — Progress Notes (Signed)
Patient refusing to eat, IV dextrose given per order. See results review for CBG levels. [place] Faster

## 2011-07-16 NOTE — Progress Notes (Signed)
ANTICOAGULATION CONSULT NOTE - Initial Consult  Pharmacy Consult for Lovenox --> Coumadin Indication: DVT (chronic LLE DVT, hx multiple DVT/PE)  No Known Allergies  Patient Measurements: Height: 6\' 1"  (185.4 cm) Weight: 300 lb 14.9 oz (136.5 kg) IBW/kg (Calculated) : 79.9   Vital Signs: Temp: 99 F (37.2 C) (07/07 0608) Temp src: Oral (07/07 0608) BP: 145/75 mmHg (07/07 0608) Pulse Rate: 83  (07/07 0608)  Labs:  Flo Shanks 07/16/11 0743 07/15/11 0620 07/14/11 1705  HGB -- -- --  HCT -- -- --  PLT -- -- --  APTT -- -- --  LABPROT 14.6 13.8 13.6  INR 1.12 1.04 1.02  HEPARINUNFRC -- -- --  CREATININE -- -- --  CKTOTAL -- -- --  CKMB -- -- --  TROPONINI -- -- --    Estimated Creatinine Clearance: 188.6 ml/min (by C-G formula based on Cr of 0.7).   Medical History: Past Medical History  Diagnosis Date  . Diabetes mellitus   . DVT (deep venous thrombosis) 09/2002    patient reports additional DVTs in '06 & '11 (unconfirmed)  . History of hypertension     resolved after weight loss  . Pulmonary embolism 09/2002    treated with 6 months of warfarin    Medications:  Prescriptions prior to admission  Medication Sig Dispense Refill  . insulin NPH-insulin regular (NOVOLIN 70/30) (70-30) 100 UNIT/ML injection Inject 50 Units into the skin 2 (two) times daily with a meal.      . metFORMIN (GLUCOPHAGE) 500 MG tablet Take 1,000 mg by mouth 2 (two) times daily with a meal.      . nitrofurantoin (MACRODANTIN) 100 MG capsule Take 100 mg by mouth 2 (two) times daily.      . promethazine (PHENERGAN) 12.5 MG suppository Place 12.5 mg rectally every 6 (six) hours as needed. For nausea.        Assessment: 36 yo M with hx of DVT/PE and chronic DVT in LLE to start full-dose Lovenox and Coumadin for anticoagulation.  Per Heme note, pt has had a negative hypercoag work-up in the past.  Patient is very hesitant to starting lifelong anticoagulation secondary to his active lifestyle.  Will  defer this decision and further discussion with the patient to the primary team.  INR today 1.12 after 10 mg coumadin x 2.  Goal of Therapy:  INR 2-3 Anti-Xa level 0.6-1.2 units/ml 4hrs after LMWH dose given Monitor platelets by anticoagulation protocol: Yes   Plan:  Cont Lovenox 140 mg SQ Q12h  Coumadin 12.5 mg PO x 1 today Daily INR.  [name], Pharm.D. Clinical Pharmacist Pager [phone] 07/16/2011 10:02 AM

## 2011-07-17 LAB — URINALYSIS, MICROSCOPIC ONLY
Nitrite: POSITIVE — AB
Specific Gravity, Urine: 1.024 (ref 1.005–1.030)
pH: 5.5 (ref 5.0–8.0)

## 2011-07-17 LAB — CBC
HCT: 36.8 % — ABNORMAL LOW (ref 39.0–52.0)
MCHC: 33.7 g/dL (ref 30.0–36.0)
MCV: 84.6 fL (ref 78.0–100.0)
RDW: 12.5 % (ref 11.5–15.5)

## 2011-07-17 LAB — BASIC METABOLIC PANEL
BUN: 11 mg/dL (ref 6–23)
Creatinine, Ser: 0.77 mg/dL (ref 0.50–1.35)
GFR calc Af Amer: >90 mL/min (ref >90)
GFR calc non Af Amer: >90 mL/min (ref >90)

## 2011-07-17 LAB — GLUCOSE, CAPILLARY: Glucose-Capillary: 195 mg/dL — ABNORMAL HIGH (ref 70–99)

## 2011-07-17 LAB — PROTIME-INR: INR: 1.6 — ABNORMAL HIGH (ref 0.00–1.49)

## 2011-07-17 MED ORDER — METFORMIN HCL 500 MG PO TABS: 1000.0000 mg | ORAL_TABLET | Freq: Two times a day (BID) | ORAL | Status: DC

## 2011-07-17 MED ORDER — DOXYLAMINE SUCCINATE (SLEEP) 25 MG PO TABS
25.0000 mg | ORAL_TABLET | Freq: Every evening | ORAL | Status: DC | PRN
  Filled 2011-07-17: qty 1

## 2011-07-17 MED ORDER — DIPHENHYDRAMINE HCL 25 MG PO CAPS
25.0000 mg | ORAL_CAPSULE | Freq: Every evening | ORAL | Status: DC | PRN
  Administered 2011-07-17: 25 mg via ORAL
  Filled 2011-07-17: qty 1

## 2011-07-17 MED ORDER — WARFARIN SODIUM 10 MG PO TABS
10.0000 mg | ORAL_TABLET | Freq: Once | ORAL | Status: AC
  Administered 2011-07-17: 10 mg via ORAL
  Filled 2011-07-17: qty 1

## 2011-07-17 MED ORDER — WARFARIN SODIUM 5 MG PO TABS: 10.0000 mg | ORAL_TABLET | Freq: Every day | ORAL | Status: DC

## 2011-07-17 MED ORDER — INSULIN NPH ISOPHANE & REGULAR (70-30) 100 UNIT/ML ~~LOC~~ SUSP: 40.0000 [IU] | Freq: Two times a day (BID) | SUBCUTANEOUS | Status: DC

## 2011-07-17 MED ORDER — INSULIN ASPART PROT & ASPART (70-30 MIX) 100 UNIT/ML ~~LOC~~ SUSP: 35.0000 [IU] | Freq: Every day | SUBCUTANEOUS | Status: DC

## 2011-07-17 MED ORDER — CEPHALEXIN 500 MG PO CAPS: 1000.0000 mg | ORAL_CAPSULE | Freq: Two times a day (BID) | ORAL | Status: DC

## 2011-07-17 MED ORDER — CEPHALEXIN 500 MG PO CAPS
1000.0000 mg | ORAL_CAPSULE | Freq: Two times a day (BID) | ORAL | Status: DC
  Administered 2011-07-17: 1000 mg via ORAL
  Filled 2011-07-17 (×2): qty 2

## 2011-07-17 MED ORDER — WARFARIN SODIUM 7.5 MG PO TABS: 7.5000 mg | ORAL_TABLET | Freq: Every day | ORAL | Status: DC

## 2011-07-17 NOTE — Progress Notes (Signed)
INTERNAL MEDICINE TEACHING SERVICE Attending Note  Date: 07/17/2011  Patient name: [name]  Medical record number: [id number]  Date of birth: 07/17/1975    This patient has been seen and discussed with the house staff. Please see their note for complete details. I concur with their findings with the following additions/corrections: States he feels better. Admits to "tea" colored urine this morning. No CP. Wound in foot is much better without drainage. Eager to go home. I discussed risks vs. Benefits of coumadin given his hx DVT/PE and that he needs close follow up to monitor INR. He understood. Will need to check UA due to complaint this AM. H/H needs to be followed.  A/P: 9 yr. Old AAM w/ IDDM type 2, HTN, history of multiple DVT's reported by patient in addition to PE, morbid obesity with BMI 40, presented with back pain s/p mechanical fall and malodorous right draining foot wound on plantar aspect of fifth toe, with nauea/vomiting/constipation.  1) Right foot diabetic ulcer/cellulitis: MRI without evidence of osteomyelitis. He will need diabetic shoes and frequent foot exams by podiatry, discussed with patient. Due to DM he is at risk for progression, reoccurrence, and complications of diabetic foot infections which can proceed to need amputation. I explained this to him and he understands. Close follow up and daily foot exams by him are extremely important in order to report ulcers/lesions to his healthcare providers. Would treat with 10 days of Keflex given Group B strep from culture.Clinically improving. He will need wound care at discharge. Also discussed Podiatry follow up.  2) IDDM type 2: Admits to non-adherence to medical therapy. Stressed importance of following our medical advice, follow up as well. Educated on complications of diabetes mellitus if he is non-adherent to therapy.  3) HTN: lisinopril to 10 mg daily.  4) N/V/Constipation: Appears resolved.  5) hx DVT/PE: Noted to have  chronic DVT on doppler u/s. He agrees to lovenox and coumadin and the monitoring that is associated with this. He understands the risks of bleeding if he does not allow his PCP to monitor his INR with coumadin use and the risk of not taking coumadin and having repeat, possibly fatal, PE. Will need close INR following at least 3 days post discharge given antibiotic use. 6) ?hematuria: Check UA, if noted RBCs, will need to re-check INR and send anti-Xa level given lovenox use. H/H minimally decreased from admission value. -rest per resident physician note, if above normal, may be discharged home with close follow up. He needs INR check within three days.  Dominic Pea, DO  07/17/2011, 12:39 PM
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
## 2011-07-17 NOTE — Progress Notes (Addendum)
ANTICOAGULATION CONSULT NOTE - Initial Consult  Pharmacy Consult for Lovenox --> Coumadin Indication: DVT (chronic LLE DVT, hx multiple DVT/PE)  No Known Allergies  Patient Measurements: Height: 6\' 1"  (185.4 cm) Weight: 281 lb 8.4 oz (127.7 kg) (bed weight) IBW/kg (Calculated) : 79.9   Vital Signs: Temp: 98 F (36.7 C) (07/08 0612) Temp src: Oral (07/08 0612) BP: 148/82 mmHg (07/08 0612) Pulse Rate: 75  (07/08 0612)  Labs:  Basename 07/17/11 1103 07/17/11 0450 07/16/11 0743 07/15/11 0620  HGB 12.4* -- -- --  HCT 36.8* -- -- --  PLT 442* -- -- --  APTT -- -- -- --  LABPROT -- 19.3* 14.6 13.8  INR -- 1.60* 1.12 1.04  HEPARINUNFRC -- -- -- --  CREATININE 0.77 -- -- --  CKTOTAL -- -- -- --  CKMB -- -- -- --  TROPONINI -- -- -- --    Estimated Creatinine Clearance: 182.2 ml/min (by C-G formula based on Cr of 0.77).   Medical History: Past Medical History  Diagnosis Date  . Diabetes mellitus   . DVT (deep venous thrombosis) 09/2002    patient reports additional DVTs in '06 & '11 (unconfirmed)  . History of hypertension     resolved after weight loss  . Pulmonary embolism 09/2002    treated with 6 months of warfarin    Medications:  Prescriptions prior to admission  Medication Sig Dispense Refill  . metFORMIN (GLUCOPHAGE) 500 MG tablet Take 1,000 mg by mouth 2 (two) times daily with a meal.      . DISCONTD: insulin NPH-insulin regular (NOVOLIN 70/30) (70-30) 100 UNIT/ML injection Inject 50 Units into the skin 2 (two) times daily with a meal.      . DISCONTD: nitrofurantoin (MACRODANTIN) 100 MG capsule Take 100 mg by mouth 2 (two) times daily.      Marland Kitchen DISCONTD: promethazine (PHENERGAN) 12.5 MG suppository Place 12.5 mg rectally every 6 (six) hours as needed. For nausea.        Assessment: 36 yo M with hx of DVT/PE and chronic DVT in LLE to start full-dose Lovenox and Coumadin for anticoagulation.  Per Heme note, pt has had a negative hypercoag work-up in the past.   Patient is very hesitant to starting lifelong anticoagulation secondary to his active lifestyle. INR rising today to 1.6.  Day #4/5 VTE overlap  Goal of Therapy:  INR 2-3 Anti-Xa level 0.6-1.2 units/ml 4hrs after LMWH dose given Monitor platelets by anticoagulation protocol: Yes   Plan:  Cont Lovenox 140 mg SQ Q12h  Coumadin 10 mg PO x 1 today Daily INR.  [place] Crane, Pharm D 07/17/2011 1:31 PM

## 2011-07-19 ENCOUNTER — Encounter: Payer: Self-pay | Admitting: *Deleted

## 2011-07-19 ENCOUNTER — Ambulatory Visit (INDEPENDENT_AMBULATORY_CARE_PROVIDER_SITE_OTHER): Payer: Self-pay | Admitting: Pharmacist

## 2011-07-19 DIAGNOSIS — Z7901 Long term (current) use of anticoagulants: Secondary | ICD-10-CM | POA: Insufficient documentation

## 2011-07-19 DIAGNOSIS — I824Y9 Acute embolism and thrombosis of unspecified deep veins of unspecified proximal lower extremity: Secondary | ICD-10-CM

## 2011-07-19 DIAGNOSIS — Z86718 Personal history of other venous thrombosis and embolism: Secondary | ICD-10-CM

## 2011-07-19 HISTORY — DX: Acute embolism and thrombosis of unspecified deep veins of unspecified proximal lower extremity: I82.4Y9

## 2011-07-19 MED ORDER — WARFARIN SODIUM 5 MG PO TABS
ORAL_TABLET | ORAL | Status: DC
Start: 2011-07-19 — End: 2012-02-09

## 2011-07-19 NOTE — Progress Notes (Signed)
Pt presents c/o bottom of R foot having large area of skin sloughing off, area very dry, denies pain, denies drainage, states he is taking all his meds as directed, he is concerned since the area is large appr size of med egg. Waxy in color. Should i advise that he not pull at it or cut it and let it slough off naturally? Should he be seen before 7/15? That is the date of hfu. Spoke w/ dr Stann Mainland, will instruct pt to not assist sloughing, to keep clean and dry and let the old skin slough naturally. Instructed to call for pain, bleeding, drainage or any other s/s of recurring infection. Pt verb understanding of instructions and will call for problems

## 2011-07-19 NOTE — Patient Instructions (Signed)
Patient instructed to take medications as defined in the Anti-coagulation Track section of this encounter.  Patient instructed to take today's dose.  Patient verbalized understanding of these instructions.    

## 2011-07-19 NOTE — Progress Notes (Signed)
Patient just in hospital for foot ulcer.  Doing much better.  Can wait for scheduled f/u app't.

## 2011-07-19 NOTE — Progress Notes (Signed)
Anti-Coagulation Progress Note  [name] is a 36 y.o. male who is currently on an anti-coagulation regimen.    RECENT RESULTS: Recent results are below, the most recent result is correlated with a dose of 5 mg. Per day while in hospital. He is being discharged on 2x5mg  (10mg ) for 2 consecutive days; then 1 and 1/2 x 5mg  (7.5mg ) x 1 day; then 5mg  until seen next Monday. Lab Results  Component Value Date   INR 1.90 07/19/2011   INR 1.91* 07/17/2011   INR 1.60* 07/17/2011    ANTI-COAG DOSE:   Latest dosing instructions   Total Sun Mon Tue Wed Thu Fri Sat   37.5 5 mg   10 mg 10 mg 7.5 mg 5 mg    (5 mg1)   (5 mg2) (5 mg2) (5 mg1.5) (5 mg1)         ANTICOAG SUMMARY: Anticoagulation Episode Summary              Current INR goal 2.0-3.0 Next INR check 07/24/2011   INR from last check 1.90! (07/19/2011)     Weekly max dose (mg)  Target end date    Indications DVT, HX OF, Acute venous embolism and thrombosis of deep vessels of proximal lower extremity, Encounter for long-term (current) use of anticoagulants   INR check location [facility]erred lab    Send INR reminders to    Comments             ANTICOAG TODAY: Anticoagulation Summary as of 07/19/2011              INR goal 2.0-3.0     Selected INR 1.90! (07/19/2011) Next INR check 07/24/2011   Weekly max dose (mg)  Target end date    Indications DVT, HX OF, Acute venous embolism and thrombosis of deep vessels of proximal lower extremity, Encounter for long-term (current) use of anticoagulants    Anticoagulation Episode Summary              INR check location [facility] Preferred lab    Send INR reminders to    Comments             PATIENT INSTRUCTIONS: Patient Instructions  Patient instructed to take medications as defined in the Anti-coagulation Track section of this encounter.  Patient instructed to take today's dose.  Patient verbalized understanding of these instructions.         FOLLOW-UP Return in 5 days (on 07/24/2011) for Follow up INR at 1430h.  Jorene Guest, III Pharm.D., CACP

## 2011-07-20 NOTE — Discharge Summary (Signed)
INTERNAL MEDICINE TEACHING SERVICE Attending Note  Date: 07/20/2011  Patient name: [name]  Medical record number: [id number]  Date of birth: 1975-06-15    This patient has been seen and discussed with the house staff. Please see their note for complete details. I concur with their findings with the following additions/corrections: See my note on date of discharge.  Dominic Pea, DO  07/20/2011, 4:44 PM

## 2011-07-24 ENCOUNTER — Ambulatory Visit (INDEPENDENT_AMBULATORY_CARE_PROVIDER_SITE_OTHER): Payer: Self-pay | Admitting: Pharmacist

## 2011-07-24 ENCOUNTER — Ambulatory Visit (INDEPENDENT_AMBULATORY_CARE_PROVIDER_SITE_OTHER): Payer: Self-pay | Admitting: Internal Medicine

## 2011-07-24 VITALS — BP 133/77 | HR 98 | Temp 98.0°F | Wt 309.3 lb

## 2011-07-24 DIAGNOSIS — E1069 Type 1 diabetes mellitus with other specified complication: Secondary | ICD-10-CM

## 2011-07-24 DIAGNOSIS — L97509 Non-pressure chronic ulcer of other part of unspecified foot with unspecified severity: Secondary | ICD-10-CM

## 2011-07-24 DIAGNOSIS — Z86718 Personal history of other venous thrombosis and embolism: Secondary | ICD-10-CM

## 2011-07-24 DIAGNOSIS — L97519 Non-pressure chronic ulcer of other part of right foot with unspecified severity: Secondary | ICD-10-CM

## 2011-07-24 DIAGNOSIS — E109 Type 1 diabetes mellitus without complications: Secondary | ICD-10-CM

## 2011-07-24 DIAGNOSIS — Z7901 Long term (current) use of anticoagulants: Secondary | ICD-10-CM

## 2011-07-24 DIAGNOSIS — I824Y9 Acute embolism and thrombosis of unspecified deep veins of unspecified proximal lower extremity: Secondary | ICD-10-CM

## 2011-07-24 DIAGNOSIS — I10 Essential (primary) hypertension: Secondary | ICD-10-CM

## 2011-07-24 LAB — GLUCOSE, CAPILLARY: Glucose-Capillary: 397 mg/dL — ABNORMAL HIGH (ref 70–99)

## 2011-07-24 NOTE — Patient Instructions (Signed)
Patient instructed to take medications as defined in the Anti-coagulation Track section of this encounter.  Patient instructed to take today's dose.  Patient verbalized understanding of these instructions.    

## 2011-07-24 NOTE — Assessment & Plan Note (Addendum)
Patient her recent A1c was 11.8 at 07/11/11. He did not take insulin before his recent admission. He was started with insulin at discharge. Currently he is on metformin and 70/30 insulin, 40 units twice a day. Since he just started taking insulin, will not change his regimen at this time and we'll followup

## 2011-07-24 NOTE — Patient Instructions (Addendum)
1. Please take all medications as prescribed. Please keep your foot clean. 3. If you have worsening of your symptoms or new symptoms arise, please call the clinic [phone]), or go to the ER immediately if symptoms are severe.

## 2011-07-24 NOTE — Assessment & Plan Note (Signed)
Patient completed course of antibiotics treatment. Currently he does not have pain in his foot. His foot ulcer is in a healing process. There is no local discharge, redness or swelling. No need for further treatment. We'll followup.

## 2011-07-24 NOTE — Progress Notes (Signed)
Patient ID: [name], male   DOB: [birth year], 36 y.o.   MRN: [id number]  Subjective:   Patient ID: [name] male   DOB: 1975/11/06 36 y.o.   MRN: [id number]  HPI: [name] is a 36 y.o. with a past medical history as outlined below, who presents for a hospital followup visit.  Patient was recently hospitalized from 07/11/11 to 07/17/11 because of right foot infection. MRI was done in hospital which showed no osteomyelitis. Patient was treated with IV antibiotics in hospital and was switched to oral medications at discharge, Keflex for 4 days at discharge. Patient completed the course of antibiotic treatment. Today he doesn't have any new complaints. His foot pain has resolved. He doesn't have a fever or chills.  Regarding his venous thrombosis, his currently taking Coumadin. He's been followed up with Dr. Elie Confer. His recent INR was 1.9 at 07/19/11.  Regarding his a hypertension, patient was started with lisinopril at discharge from his recent hospitalization. Today his blood pressure is 133/77.  Regarding his diabetes, he is currently taking Novolin 70/30 insulin, 40 units twice a day. He did not have symptoms of hypoglycemia.   Past Medical History  Diagnosis Date  . Diabetes mellitus   . DVT (deep venous thrombosis) 09/2002    patient reports additional DVTs in '06 & '11 (unconfirmed)  . History of hypertension     resolved after weight loss  . Pulmonary embolism 09/2002    treated with 6 months of warfarin   Current Outpatient Prescriptions  Medication Sig Dispense Refill  . insulin NPH-insulin regular (NOVOLIN 70/30) (70-30) 100 UNIT/ML injection Inject 40 Units into the skin 2 (two) times daily with a meal.  10 mL  0  . lisinopril (PRINIVIL,ZESTRIL) 10 MG tablet Take 1 tablet (10 mg total) by mouth daily.  30 tablet  0  . metFORMIN (GLUCOPHAGE) 500 MG tablet Take 2 tablets (1,000 mg total) by mouth 2 (two) times daily with a meal.  120 tablet  6  . warfarin (COUMADIN) 5 MG  tablet Take as directed by anticoagulation clinic provider.  30 tablet  11  . ondansetron (ZOFRAN) 8 MG tablet Take 1 tablet (8 mg total) by mouth every 8 (eight) hours as needed for nausea.  20 tablet  0   Family History  Problem Relation Age of Onset  . Heart disease    . Diabetes    . Obesity     History   Social History  . Marital Status: Single    Spouse Name: N/A    Number of Children: N/A  . Years of Education: N/A   Occupational History  . [employer]   . student    Social History Main Topics  . Smoking status: Never Smoker   . Smokeless tobacco: Not on file  . Alcohol Use: Yes     occasional, q3w 2 beers  . Drug Use: No  . Sexually Active: Not on file   Other Topics Concern  . Not on file   Social History Narrative   Financial assistance approved for 100% discount at [facility] and has [facility] card per Deborah Hill8/24/2011.Lives in [place] with mother and sister.   Review of Systems: ROS: General: no fevers, chills, no changes in body weight, no changes in appetite Skin: no rash HEENT: no blurry vision, hearing changes or sore throat Pulm: no dyspnea, coughing, wheezing CV: no chest pain, palpitations, shortness of breath Abd: no nausea/vomiting, abdominal pain, diarrhea/constipation GU: no dysuria, hematuria, polyuria Ext: has right  foot ulcer. Neuro: no weakness, numbness, or tingling   Objective:  Physical Exam: Filed Vitals:   07/24/11 1529  BP: 133/77  Pulse: 98  Temp: 98 F (36.7 C)  TempSrc: Oral  Weight: 309 lb 4.8 oz (140.298 kg)    General: resting in bed, not in acute distress HEENT: PERRL, EOMI, no scleral icterus Cardiac: S1/S2, RRR, No murmurs, gallops or rubs Pulm: Good air movement bilaterally, Clear to auscultation bilaterally, No rales, wheezing, rhonchi or rubs. Abd: Soft,  nondistended, nontender, no rebound pain, no organomegaly, BS present Ext: No rashes or edema, 2+DP/PT pulse bilaterally. Has foot ulcer at right planta which is  not red or swelling. 1.8 cm in size. There is no local discharge. The foot ulcer is in healing process. Musculoskeletal: No joint deformities, erythema, or stiffness, ROM full and no nontender Skin: no rashes. No skin bruise. Neuro: alert and oriented X3, cranial nerves II-XII grossly intact, muscle strength 5/5 in all extremeties,  sensation to light touch intact.  Psych.: patient is not psychotic, no suicidal or hemocidal ideation.    Assessment & Plan:

## 2011-07-24 NOTE — Assessment & Plan Note (Signed)
Patient was recently started with lisinopril. His blood pressure is well controlled. His blood pressure is 133/77. We'll check BMP to make sure he is creatinine is OK.

## 2011-07-24 NOTE — Progress Notes (Signed)
Anti-Coagulation Progress Note  [name] is a 36 y.o. male who is currently on an anti-coagulation regimen.    RECENT RESULTS: Recent results are below, the most recent result is correlated with a dose of 37.5 mg over a 4 day period. Lab Results  Component Value Date   INR 2.00 07/24/2011   INR 1.90 07/19/2011   INR 1.91* 07/17/2011    ANTI-COAG DOSE:   Latest dosing instructions   Total Sun Mon Tue Wed Thu Fri Sat   62.5 7.5 mg 10 mg 10 mg 7.5 mg 10 mg 7.5 mg 10 mg    (5 mg1.5) (5 mg2) (5 mg2) (5 mg1.5) (5 mg2) (5 mg1.5) (5 mg2)         ANTICOAG SUMMARY: Anticoagulation Episode Summary              Current INR goal 2.0-3.0 Next INR check 07/31/2011   INR from last check 2.00 (07/24/2011)     Weekly max dose (mg)  Target end date    Indications DVT, HX OF, Acute venous embolism and thrombosis of deep vessels of proximal lower extremity, Encounter for long-term (current) use of anticoagulants   INR check location [facility] Preferred lab    Send INR reminders to    Comments             ANTICOAG TODAY: Anticoagulation Summary as of 07/24/2011              INR goal 2.0-3.0     Selected INR 2.00 (07/24/2011) Next INR check 07/31/2011   Weekly max dose (mg)  Target end date    Indications DVT, HX OF, Acute venous embolism and thrombosis of deep vessels of proximal lower extremity, Encounter for long-term (current) use of anticoagulants    Anticoagulation Episode Summary              INR check location [facility] Preferred lab    Send INR reminders to    Comments             PATIENT INSTRUCTIONS: Patient Instructions  Patient instructed to take medications as defined in the Anti-coagulation Track section of this encounter.  Patient instructed to take today's dose.  Patient verbalized understanding of these instructions.        FOLLOW-UP Return in 7 days (on 07/31/2011) for Follow up INR at 1015h.  Jorene Guest, III Pharm.D., CACP

## 2011-07-25 LAB — URINALYSIS
Glucose, UA: >1000 mg/dL — AB
Leukocytes, UA: NEGATIVE
Nitrite: NEGATIVE
Protein, ur: 30 mg/dL — AB

## 2011-07-25 LAB — BASIC METABOLIC PANEL WITH GFR
CO2: 27 mEq/L (ref 19–32)
Calcium: 9.2 mg/dL (ref 8.4–10.5)
Chloride: 99 mEq/L (ref 96–112)
Sodium: 133 mEq/L — ABNORMAL LOW (ref 135–145)

## 2011-07-31 ENCOUNTER — Ambulatory Visit: Payer: Self-pay

## 2011-08-22 ENCOUNTER — Encounter: Payer: Self-pay | Admitting: Internal Medicine

## 2011-09-27 ENCOUNTER — Other Ambulatory Visit: Payer: Self-pay | Admitting: Internal Medicine

## 2011-09-27 DIAGNOSIS — Z7901 Long term (current) use of anticoagulants: Secondary | ICD-10-CM

## 2011-09-27 DIAGNOSIS — E1051 Type 1 diabetes mellitus with diabetic peripheral angiopathy without gangrene: Secondary | ICD-10-CM

## 2011-09-27 DIAGNOSIS — Z86718 Personal history of other venous thrombosis and embolism: Secondary | ICD-10-CM

## 2011-11-06 ENCOUNTER — Ambulatory Visit (INDEPENDENT_AMBULATORY_CARE_PROVIDER_SITE_OTHER): Payer: Self-pay | Admitting: Pharmacist

## 2011-11-06 ENCOUNTER — Telehealth: Payer: Self-pay | Admitting: *Deleted

## 2011-11-06 DIAGNOSIS — Z7901 Long term (current) use of anticoagulants: Secondary | ICD-10-CM

## 2011-11-06 DIAGNOSIS — I824Y9 Acute embolism and thrombosis of unspecified deep veins of unspecified proximal lower extremity: Secondary | ICD-10-CM

## 2011-11-06 DIAGNOSIS — Z86718 Personal history of other venous thrombosis and embolism: Secondary | ICD-10-CM

## 2011-11-06 LAB — POCT INR: INR: 1

## 2011-11-06 NOTE — Patient Instructions (Addendum)
Patient instructed to take medications as defined in the Anti-coagulation Track section of this encounter.  Patient instructed to take today's dose.  Patient verbalized understanding of these instructions.    

## 2011-11-06 NOTE — Telephone Encounter (Signed)
Pt called off work - wants appt today with Dr Elie Confer. Informed to come now. Hilda Blades Rilie Glanz RN 11/06/11 1:50PM

## 2011-11-06 NOTE — Telephone Encounter (Signed)
Problem with calus right foot for months and draining. Appt 11/08/11 8:45AM Dr Owens Shark - off work that AM - pt request. Kolby Myung RN 11/06/11 3:40PM

## 2011-11-06 NOTE — Progress Notes (Signed)
Anti-Coagulation Progress Note  [name] is a 36 y.o. male who is currently on an anti-coagulation regimen.    RECENT RESULTS: Recent results are below, the most recent result is correlated with a dose of 62.5 mg. per week: Lab Results  Component Value Date   INR 2.00 07/24/2011   INR 1.90 07/19/2011   INR 1.91* 07/17/2011    ANTI-COAG DOSE:   Latest dosing instructions   Total Sun Mon Tue Wed Thu Fri Sat   70 10 mg 10 mg 10 mg 10 mg 10 mg 10 mg 10 mg    (5 mg2) (5 mg2) (5 mg2) (5 mg2) (5 mg2) (5 mg2) (5 mg2)         ANTICOAG SUMMARY: Anticoagulation Episode Summary              Current INR goal 2.0-3.0 Next INR check 11/20/2011   INR from last check 2.00 (07/24/2011)     Weekly max dose (mg)  Target end date    Indications DVT, HX OF, Acute venous embolism and thrombosis of deep vessels of proximal lower extremity, Long term (current) use of anticoagulants   INR check location [facility] Preferred lab    Send INR reminders to    Comments             ANTICOAG TODAY: Anticoagulation Summary as of 11/06/2011              INR goal 2.0-3.0     Selected INR 2.00 (07/24/2011) Next INR check 11/20/2011   Weekly max dose (mg)  Target end date    Indications DVT, HX OF, Acute venous embolism and thrombosis of deep vessels of proximal lower extremity, Long term (current) use of anticoagulants    Anticoagulation Episode Summary              INR check location [facility] Preferred lab    Send INR reminders to    Comments             PATIENT INSTRUCTIONS: Patient Instructions  Patient instructed to take medications as defined in the Anti-coagulation Track section of this encounter.  Patient instructed to take today's dose.  Patient verbalized understanding of these instructions.        FOLLOW-UP Return in 2 weeks (on 11/20/2011) for Follow up INR at 3:15PM.  Jorene Guest, III Pharm.D., CACP

## 2011-11-08 ENCOUNTER — Encounter: Payer: Self-pay | Admitting: Internal Medicine

## 2011-11-08 ENCOUNTER — Ambulatory Visit: Payer: Self-pay | Admitting: Internal Medicine

## 2011-11-08 NOTE — Progress Notes (Signed)
  This patient is a CHRONIC NO-SHOW PATIENT that has a history of HYPERTENSION.  Please make sure to address hypertension during his next clinic appointment, and intervene as appropriate.    Within the AVS, please incorporate the following smartphrase: .htntips   Pertinent Data: BP Readings from Last 3 Encounters:  07/24/11 133/77  07/17/11 126/62  07/11/11 133/78    BMI: Estimated Body mass index is 40.81 kg/(m^2) as calculated from the following:   Height as of 07/11/11: 6\' 1" (1.854 m).   Weight as of 07/24/11: 309 lb 4.8 oz(140.298 kg).  Smoking History: History  Smoking status  . Never Smoker   Smokeless tobacco  . Not on file    Last Basic Metabolic Panel:    Component Value Date/Time   NA 133* 07/24/2011 1610   K 3.9 07/24/2011 1610   CL 99 07/24/2011 1610   CO2 27 07/24/2011 1610   BUN 11 07/24/2011 1610   CREATININE 0.84 07/24/2011 1610   CREATININE 0.77 07/17/2011 1103   GLUCOSE 366* 07/24/2011 1610   CALCIUM 9.2 07/24/2011 1610    Allergies: No Known Allergies

## 2011-11-20 ENCOUNTER — Ambulatory Visit: Payer: Self-pay

## 2012-02-09 ENCOUNTER — Encounter: Payer: Self-pay | Admitting: Internal Medicine

## 2012-02-09 ENCOUNTER — Observation Stay (HOSPITAL_COMMUNITY)
Admission: AD | Admit: 2012-02-09 | Discharge: 2012-02-12 | DRG: 699 | Disposition: A | Discharge status: Home / Self Care | Payer: MEDICAID | Source: Ambulatory Visit | Attending: Infectious Disease | Admitting: Infectious Disease

## 2012-02-09 ENCOUNTER — Ambulatory Visit (INDEPENDENT_AMBULATORY_CARE_PROVIDER_SITE_OTHER): Payer: Self-pay | Admitting: Internal Medicine

## 2012-02-09 ENCOUNTER — Inpatient Hospital Stay (HOSPITAL_COMMUNITY): Payer: Self-pay

## 2012-02-09 VITALS — BP 165/91 | HR 93 | Temp 97.8°F | Ht 73.0 in | Wt 349.2 lb

## 2012-02-09 DIAGNOSIS — I10 Essential (primary) hypertension: Secondary | ICD-10-CM | POA: Insufficient documentation

## 2012-02-09 DIAGNOSIS — E1051 Type 1 diabetes mellitus with diabetic peripheral angiopathy without gangrene: Secondary | ICD-10-CM

## 2012-02-09 DIAGNOSIS — Z91199 Patient's noncompliance with other medical treatment and regimen due to unspecified reason: Secondary | ICD-10-CM | POA: Insufficient documentation

## 2012-02-09 DIAGNOSIS — I2699 Other pulmonary embolism without acute cor pulmonale: Secondary | ICD-10-CM

## 2012-02-09 DIAGNOSIS — I798 Other disorders of arteries, arterioles and capillaries in diseases classified elsewhere: Secondary | ICD-10-CM | POA: Insufficient documentation

## 2012-02-09 DIAGNOSIS — E1065 Type 1 diabetes mellitus with hyperglycemia: Secondary | ICD-10-CM | POA: Insufficient documentation

## 2012-02-09 DIAGNOSIS — Z86718 Personal history of other venous thrombosis and embolism: Secondary | ICD-10-CM | POA: Insufficient documentation

## 2012-02-09 DIAGNOSIS — E8770 Fluid overload, unspecified: Secondary | ICD-10-CM

## 2012-02-09 DIAGNOSIS — N049 Nephrotic syndrome with unspecified morphologic changes: Secondary | ICD-10-CM | POA: Insufficient documentation

## 2012-02-09 DIAGNOSIS — L03039 Cellulitis of unspecified toe: Secondary | ICD-10-CM | POA: Insufficient documentation

## 2012-02-09 DIAGNOSIS — E1059 Type 1 diabetes mellitus with other circulatory complications: Principal | ICD-10-CM | POA: Insufficient documentation

## 2012-02-09 DIAGNOSIS — L03119 Cellulitis of unspecified part of limb: Secondary | ICD-10-CM | POA: Insufficient documentation

## 2012-02-09 DIAGNOSIS — IMO0002 Reserved for concepts with insufficient information to code with codable children: Secondary | ICD-10-CM | POA: Insufficient documentation

## 2012-02-09 DIAGNOSIS — Z794 Long term (current) use of insulin: Secondary | ICD-10-CM | POA: Insufficient documentation

## 2012-02-09 DIAGNOSIS — M79671 Pain in right foot: Secondary | ICD-10-CM

## 2012-02-09 DIAGNOSIS — Z23 Encounter for immunization: Secondary | ICD-10-CM | POA: Insufficient documentation

## 2012-02-09 DIAGNOSIS — R809 Proteinuria, unspecified: Secondary | ICD-10-CM

## 2012-02-09 DIAGNOSIS — M7989 Other specified soft tissue disorders: Secondary | ICD-10-CM

## 2012-02-09 DIAGNOSIS — E118 Type 2 diabetes mellitus with unspecified complications: Secondary | ICD-10-CM

## 2012-02-09 DIAGNOSIS — E1029 Type 1 diabetes mellitus with other diabetic kidney complication: Secondary | ICD-10-CM | POA: Insufficient documentation

## 2012-02-09 DIAGNOSIS — E8779 Other fluid overload: Secondary | ICD-10-CM | POA: Insufficient documentation

## 2012-02-09 DIAGNOSIS — E11649 Type 2 diabetes mellitus with hypoglycemia without coma: Secondary | ICD-10-CM

## 2012-02-09 DIAGNOSIS — L84 Corns and callosities: Secondary | ICD-10-CM

## 2012-02-09 DIAGNOSIS — Z86711 Personal history of pulmonary embolism: Secondary | ICD-10-CM | POA: Insufficient documentation

## 2012-02-09 DIAGNOSIS — Z9119 Patient's noncompliance with other medical treatment and regimen: Secondary | ICD-10-CM | POA: Insufficient documentation

## 2012-02-09 DIAGNOSIS — L97519 Non-pressure chronic ulcer of other part of right foot with unspecified severity: Secondary | ICD-10-CM

## 2012-02-09 DIAGNOSIS — Z7901 Long term (current) use of anticoagulants: Secondary | ICD-10-CM | POA: Insufficient documentation

## 2012-02-09 DIAGNOSIS — I82409 Acute embolism and thrombosis of unspecified deep veins of unspecified lower extremity: Secondary | ICD-10-CM

## 2012-02-09 DIAGNOSIS — L97509 Non-pressure chronic ulcer of other part of unspecified foot with unspecified severity: Secondary | ICD-10-CM

## 2012-02-09 DIAGNOSIS — E119 Type 2 diabetes mellitus without complications: Secondary | ICD-10-CM | POA: Diagnosis present

## 2012-02-09 DIAGNOSIS — S90423A Blister (nonthermal), unspecified great toe, initial encounter: Secondary | ICD-10-CM

## 2012-02-09 DIAGNOSIS — L02419 Cutaneous abscess of limb, unspecified: Secondary | ICD-10-CM | POA: Insufficient documentation

## 2012-02-09 DIAGNOSIS — Z79899 Other long term (current) drug therapy: Secondary | ICD-10-CM | POA: Insufficient documentation

## 2012-02-09 DIAGNOSIS — Z6841 Body Mass Index (BMI) 40.0 and over, adult: Secondary | ICD-10-CM | POA: Insufficient documentation

## 2012-02-09 DIAGNOSIS — R600 Localized edema: Secondary | ICD-10-CM

## 2012-02-09 LAB — TSH: TSH: 1.574 u[IU]/mL (ref 0.350–4.500)

## 2012-02-09 LAB — URINALYSIS, ROUTINE W REFLEX MICROSCOPIC
Bilirubin Urine: NEGATIVE
Glucose, UA: NEGATIVE mg/dL
Ketones, ur: NEGATIVE mg/dL
Leukocytes, UA: NEGATIVE
pH: 5.5 (ref 5.0–8.0)

## 2012-02-09 LAB — GLUCOSE, CAPILLARY
Glucose-Capillary: 46 mg/dL — ABNORMAL LOW (ref 70–99)
Glucose-Capillary: 114 mg/dL — ABNORMAL HIGH (ref 70–99)
Glucose-Capillary: 37 mg/dL — CL (ref 70–99)
Glucose-Capillary: 135 mg/dL — ABNORMAL HIGH (ref 70–99)

## 2012-02-09 LAB — CBC WITH DIFFERENTIAL/PLATELET
Basophils Absolute: 0 10*3/uL (ref 0.0–0.1)
Basophils Relative: 0 % (ref 0–1)
Eosinophils Absolute: 0.1 10*3/uL (ref 0.0–0.7)
Hemoglobin: 14 g/dL (ref 13.0–17.0)
MCH: 27.8 pg (ref 26.0–34.0)
MCHC: 32.8 g/dL (ref 30.0–36.0)
MCV: 84.7 fL (ref 78.0–100.0)
Monocytes Relative: 8 % (ref 3–12)
Neutro Abs: 3.3 10*3/uL (ref 1.7–7.7)
Neutrophils Relative %: 52 % (ref 43–77)
Platelets: 315 10*3/uL (ref 150–400)
RDW: 12.7 % (ref 11.5–15.5)

## 2012-02-09 LAB — COMPREHENSIVE METABOLIC PANEL
ALT: 20 U/L (ref 0–53)
Alkaline Phosphatase: 84 U/L (ref 39–117)
BUN: 18 mg/dL (ref 6–23)
CO2: 27 mEq/L (ref 19–32)
Calcium: 9.5 mg/dL (ref 8.4–10.5)
Chloride: 101 mEq/L (ref 96–112)
Creat: 0.97 mg/dL (ref 0.50–1.35)
Glucose, Bld: 113 mg/dL — ABNORMAL HIGH (ref 70–99)
Total Protein: 7.6 g/dL (ref 6.0–8.3)

## 2012-02-09 LAB — URINALYSIS, MICROSCOPIC ONLY: Crystals: NONE SEEN

## 2012-02-09 LAB — TROPONIN I
Troponin I: <0.3 ng/mL (ref <0.30)
Troponin I: <0.3 ng/mL (ref <0.30)

## 2012-02-09 LAB — PROTEIN, URINE, RANDOM: Total Protein, Urine: 384 mg/dL

## 2012-02-09 LAB — POCT GLYCOSYLATED HEMOGLOBIN (HGB A1C): Hemoglobin A1C: 8.2

## 2012-02-09 MED ORDER — SODIUM CHLORIDE 0.9 % IJ SOLN
3.0000 mL | Freq: Two times a day (BID) | INTRAMUSCULAR | Status: DC
  Administered 2012-02-10 – 2012-02-12 (×4): 3 mL via INTRAVENOUS
  Administered 2012-02-12: 07:00:00 via INTRAVENOUS

## 2012-02-09 MED ORDER — DEXTROSE 50 % IV SOLN
INTRAVENOUS | Status: AC
  Filled 2012-02-09: qty 50

## 2012-02-09 MED ORDER — INSULIN GLARGINE 100 UNIT/ML ~~LOC~~ SOLN: 20.0000 [IU] | Freq: Two times a day (BID) | SUBCUTANEOUS | Status: DC

## 2012-02-09 MED ORDER — ENOXAPARIN SODIUM 150 MG/ML ~~LOC~~ SOLN
160.0000 mg | Freq: Two times a day (BID) | SUBCUTANEOUS | Status: DC
  Filled 2012-02-09 (×2): qty 2

## 2012-02-09 MED ORDER — SODIUM CHLORIDE 0.9 % IJ SOLN: 3.0000 mL | Freq: Two times a day (BID) | INTRAMUSCULAR | Status: DC

## 2012-02-09 MED ORDER — WARFARIN - PHARMACIST DOSING INPATIENT
Freq: Every day | Status: DC
  Administered 2012-02-12: 17:00:00

## 2012-02-09 MED ORDER — ONDANSETRON HCL 4 MG PO TABS: 4.0000 mg | ORAL_TABLET | Freq: Four times a day (QID) | ORAL | Status: DC | PRN

## 2012-02-09 MED ORDER — LISINOPRIL 10 MG PO TABS
10.0000 mg | ORAL_TABLET | Freq: Every day | ORAL | Status: DC
  Administered 2012-02-09 – 2012-02-12 (×4): 10 mg via ORAL
  Filled 2012-02-09 (×4): qty 1

## 2012-02-09 MED ORDER — WARFARIN SODIUM 10 MG PO TABS
10.0000 mg | ORAL_TABLET | Freq: Once | ORAL | Status: AC
  Administered 2012-02-09: 10 mg via ORAL
  Filled 2012-02-09: qty 1

## 2012-02-09 MED ORDER — ENOXAPARIN SODIUM 150 MG/ML ~~LOC~~ SOLN
160.0000 mg | Freq: Two times a day (BID) | SUBCUTANEOUS | Status: DC
  Administered 2012-02-09 – 2012-02-12 (×5): 160 mg via SUBCUTANEOUS
  Filled 2012-02-09 (×9): qty 2

## 2012-02-09 MED ORDER — ACETAMINOPHEN 650 MG RE SUPP: 650.0000 mg | Freq: Four times a day (QID) | RECTAL | Status: DC | PRN

## 2012-02-09 MED ORDER — INSULIN ASPART 100 UNIT/ML ~~LOC~~ SOLN: 0.0000 [IU] | Freq: Three times a day (TID) | SUBCUTANEOUS | Status: DC

## 2012-02-09 MED ORDER — SODIUM CHLORIDE 0.9 % IJ SOLN: 3.0000 mL | INTRAMUSCULAR | Status: DC | PRN

## 2012-02-09 MED ORDER — SODIUM CHLORIDE 0.9 % IV SOLN: 250.0000 mL | INTRAVENOUS | Status: DC | PRN

## 2012-02-09 MED ORDER — ENOXAPARIN SODIUM 150 MG/ML ~~LOC~~ SOLN: 160.0000 mg | Freq: Two times a day (BID) | SUBCUTANEOUS | Status: DC

## 2012-02-09 MED ORDER — ACETAMINOPHEN 325 MG PO TABS: 650.0000 mg | ORAL_TABLET | Freq: Four times a day (QID) | ORAL | Status: DC | PRN

## 2012-02-09 MED ORDER — GLUCOSE 40 % PO GEL
ORAL | Status: AC
  Administered 2012-02-09: 37.5 g
  Filled 2012-02-09: qty 1

## 2012-02-09 MED ORDER — DEXTROSE 50 % IV SOLN
1.0000 | Freq: Once | INTRAVENOUS | Status: AC
  Administered 2012-02-09: 50 mL via INTRAVENOUS

## 2012-02-09 MED ORDER — ONDANSETRON HCL 4 MG/2ML IJ SOLN: 4.0000 mg | Freq: Four times a day (QID) | INTRAMUSCULAR | Status: DC | PRN

## 2012-02-09 NOTE — Progress Notes (Signed)
Report was called to Nurse on 2000.  Pt transported via wheelchair to 2004.  Saline lock intact.  Sander Nephew, RN 02/09/2012 4:11 PM.

## 2012-02-09 NOTE — H&P (Signed)
Hospital Admission Note Date: 02/09/2012  Patient name: [name] Medical record number: [id number] Date of birth: 04/05/1975 Age: 37 y.o. Gender: male PCP: PRIBULA,CHRISTOPHER, MD  Medical Service: [facility]  Attending physician: Dr. Tommy Medal 1st Contact: Dr. Eula Fried   Pager:[phone] 2nd Contact: Dr. Nicoletta Dress    Pager:[phone] After 5 pm or weekends: 1st Contact:  Intern on call   Pager: [phone] 2nd Contact:  Resident on call  Pager: [phone]  Chief Complaint: Weight gain, R ankle swelling/pain  History of Present Illness: [name] is a 37 y.o. man pmh poorly controlled DM and on long term anti-coagulation for DVTs and PEs presenting acutely to clinic for foot/toe pain. Pt stated that he has had acute weight gain may be within the past month has been both noticeable by himself and his girlfriend he feels is mostly localized in his legs and possibly body. His regular measured weight at home is 309 and patient is 349 today. Also per chart review, patient's last recorded weight was 349.  Patient has been self regulating warfarin for history of DVTs and PEs with no regular INR checks. And has had sudden acute worsening of unilateral edema in his right foot. Patient is also started vomiting and having nausea today but no diarrhea. This is accompanied by some subjective fevers and chills. Patient took insulin this morning after not eating. When asked about his CBG readings at home, patient states measures 3 times a day and has within the past 2 days run as low as 38 with accompanied shakes and sweats. Measured blood glucose in clinic was 46. He was given glucose gel, and blood sugar increased to 114.   In regards to toe pain, his R great toenail fell off yesterday and has noted some weeping purulent drainage from the exposed nailbed.. Over the past day or two, he has noticed increased swelling of R ankle. He reports pain and tightness of skin over R foot. Is able to bear weight and move ankle  with full ROM and no pain in joint. He has a chronic large diabetic callous of the R great toe which he reports had been infected previously. Patient tried to apply Vaseline to toe and compression stockings over R leg to decrease the ankle swelling, but symptoms persisted. Denies HA, chest pain, shortness of breath, cough, PND, orthopnea, joint pain, decreased ROM of ankle joints, abdominal pain, or dizziness.    Meds: Current Outpatient Rx  Name  Route  Sig  Dispense  Refill  . INSULIN ISOPHANE & REGULAR (70-30) 100 UNIT/ML [place]oy SUSP   Subcutaneous   Inject 40 Units into the skin 2 (two) times daily with a meal.   10 mL   0   . LISINOPRIL 10 MG PO TABS   Oral   Take 1 tablet (10 mg total) by mouth daily.   30 tablet   0   . METFORMIN HCL 500 MG PO TABS   Oral   Take 2 tablets (1,000 mg total) by mouth 2 (two) times daily with a meal.   120 tablet   6   . WARFARIN SODIUM 5 MG PO TABS      Take as directed by anticoagulation clinic provider.   30 tablet   11     Allergies: Allergies as of 02/09/2012  . (No Known Allergies)   Past Medical History  Diagnosis Date  . Diabetes mellitus   . DVT (deep venous thrombosis) 09/2002    patient reports additional DVTs in '06 & '  11 (unconfirmed)  . History of hypertension     resolved after weight loss  . Pulmonary embolism 09/2002    treated with 6 months of warfarin   Past Surgical History  Procedure Date  . Back abscess i&d 2007   Family History  Problem Relation Age of Onset  . Heart disease    . Diabetes    . Obesity     History   Social History  . Marital Status: Single    Spouse Name: N/A    Number of Children: N/A  . Years of Education: N/A   Occupational History  . [employer]   . student    Social History Main Topics  . Smoking status: Never Smoker   . Smokeless tobacco: Not on file  . Alcohol Use: Yes     Comment: occasional, q3w 2 beers  . Drug Use: No  . Sexually Active: Not on file   Other Topics  Concern  . Not on file   Social History Narrative   Financial assistance approved for 100% discount at [facility] and has [facility] card per Deborah Hill8/24/2011.Lives in [place] with mother and sister.    Review of Systems: 10 pt ROS performed, pertinent positives and negatives noted in HPI  Physical Exam: BP 165/91 Pulse 93 Temp 97.8 F (36.6 C) (Oral) Ht  6\' 1"  (1.854 m) Wt Readings from Last 3 Encounters:  02/09/12 349 lb 3.2 oz (158.396 kg)  07/24/11 309 lb 4.8 oz (140.298 kg)  07/17/11 281 lb 8.4 oz (127.7 kg)  Vitals reviewed. General: Obese male sitting up on clinic bed, relaxed-appearing, NAD HEENT: PERRL, EOMI, no scleral icterus Cardiac: RRR, no rubs, murmurs or gallops Pulm: clear to auscultation bilaterally, no wheezes, rales, or rhonchi appreciated Abd: soft, nontender, nondistended, BS present Neuro: alert and oriented X3, cranial nerves II-XII grossly intact, strength and sensation to light touch equal in bilateral upper and lower extremities Extremities: Calves are symmetric in size (48cm) without erythema or tenderness. No pain elicited with calf squeeze or dorsiflexion Prominent swelling and erythema of R foot and ankle. R great toenail is absent with small purulent fluid draining from nail bed at medial nail fold site. Large callous of plantar skin under R MTP joing. L foot with callous on lateral arch, no fluid draining.    Lab results: Basic Metabolic Panel:  Basename 02/09/12 1455  NA 138  K 4.1  CL 101  CO2 27  GLUCOSE 113*  BUN 18  CREATININE 0.97  CALCIUM 9.5  MG --  PHOS --   Liver Function Tests:  Basename 02/09/12 1455  AST 18  ALT 20  ALKPHOS 84  BILITOT 0.3  PROT 7.6  ALBUMIN 3.1*   CBC:  Basename 02/09/12 1455  WBC 6.4  NEUTROABS 3.3  HGB 14.0  HCT 42.7  MCV 84.7  PLT 315   Cardiac Enzymes:  Basename 02/09/12 1455  CKTOTAL --  CKMB --  CKMBINDEX --  TROPONINI <0.30   CBG:  Basename 02/09/12 1438 02/09/12 1406 02/09/12  1341  GLUCAP 114* 46* 56*   Hemoglobin A1C:  Basename 02/09/12 1353  HGBA1C 8.2   Coagulation:  Basename 02/09/12 1455  LABPROT 13.8  INR 1.1   Assessment & Plan by Problem: [name] is a 37 y.o. man pmh poorly controlled DM and on long term anti-coagulation for DVTs and PEs presenting acutely to clinic with complaints of weight gain and R foot pain and swelling.  1) R foot/ankle swelling and pain Patient presents  with acute onset swelling of R ankle and foot with no history of antecedent trauma. He has h/o DVTs, is erratic with warfarin use and subtherapeutic w INR 1.1. He does not have any calf edema or asymmetry. The clinical presentation seems unusual for DVT, but given the history and acute onset, DVT should be considered on the differential diagnosis.  Also of concern is loss of R toenail with some drainage of purulent fluid from nailbed. He has diabetic ulcer of R great toe which has been infected previously but he has never had osteomyelitis. Low suspicion for cellulitis or osteomyelitis based on examination. Appearance of toe consistent with paronychia; however, it is possible that deeper tissue infection is obscured at this time by hyperkeratotic callous. No evidence of systemic infection at this time (normal HR, BP, no leukocytosis).  - Will anticoagulate with lovenox and warfarin. Patient will require bridging to therapeutic INR. - Bilateral LE dopplers - Xray of R ankle to look for evidence of osteomyelitis - Culture of purulent fluid from R great toe - Hold antibiotics for now  2) Weight gain Has 40lb weight gain since July. He does have swelling of feet but no pitting edema of calves. No rales on exam to suggest pulmonary edema. Echo in 2004 shows EF 50-55%.  He is hypertensive today to 165/91. - Will check CXR and pro-BNP - Will repeat Echo - No clear acute decompensated HF, will hold off on diuresis for now - Lisinopril 10mg  (prior home dose) for HTN - Cycle  troponins, EKG  3) Insulin dependent DM with symptomatic hypoglycemia Blood sugar dropped to 46 today after patient administering insulin without eating anything today or much last night. Blood sugar increased to 114 after receiving glucose gel in clinic. Suspect drop in blood sugar related to inappropriate insulin administrating in setting of decreased po intake. - Hold long-acting insulin. - Hold sliding scale for now given degree of hypoglycemia   4)HTN History of HTN an on lisinopril. BP 165/91 today, pt in acute pain, - Continue lisinopril   Dispo: Disposition is deferred at this time, awaiting improvement of current medical problems. Anticipated discharge in approximately 1-2 day(s).   The patient does have a current PCP (PRIBULA,CHRISTOPHER, MD), therefore will be requiring [facility] follow-up after discharge.   The patient does not have transportation limitations that hinder transportation to clinic appointments.  Signed: Tonia Brooms   Internal Medicine Teaching Service Attending Note Date: 02/10/2012  Patient name: [name]  Medical record number: [id number]  Date of birth: 1975-02-20   I have seen and evaluated [name] and discussed their care with the Residency Team.    Assessment and Plan: I agree with the formulated Assessment and Plan with the following addendum:  37 year old man with poorly controlled diabetes mellitus who has been noncompliant with his anticoagulation for recurrent deep venous thrombosis and pulmonary emboli admitted with painful toe and foot. He had lost his toe nail after removal of compression stockings and noted  purulent discharge at that time. He has also had 40# weight gain since July with poorly controlled HTN and urine concerning for nephrotic range proteinuria  With re to his foot he has pain and tenderness in his mid foot and ankle in addition to the area of tenderness about his toe and metatarsal joint.  We will get an MRI of his  foot to ensure there is no deeper infection such as osteomyelitis or foot abscess. After we obtain the MRI if there is no evidence for  such a more sinister pathology we will start him on doxycycline to cover him for possible staph infection of his toe nail.  He has hypoglycemia due to poor oral intake with continue 70/30 which we have been holding  We have put him back on lovenox to bridge to warfarin  With regards to his proteinuria will collect a 24-hour urine.  I've emphasized to him the utmost importance with compliance with medical therapy for his blood pressure diabetes, and his recurrent deep venous thromboses. He is anxious to return to work and to be having an active life again but clearly been compliant with his therapy will be very important. He claims to now have insurance which should help him with assistance of other subspecialists they may augment his care.  Truman Hayward, [place] 2/1/201410:53 AM  02/09/2012, 4:35 PM

## 2012-02-09 NOTE — Progress Notes (Signed)
Hypoglycemic Event  CBG: 56  Treatment: # Glucose Tablets  Symptoms: Not feeling well  Follow-up CBG: Time: 2:10 PM CBG Result: 46  Possible Reasons for Event: Nausea and vomiting  Comments/MD notified: Yes    [name] Hypoglycemic Event  CBG:46  Treatment: Glucose Gel and Coke  Symptoms: None  Follow-up CBG: Time: 2:38 PM CBG Result: 114  Possible Reasons for Event: Vomiting and Inadequate meal intake  Comments/MD notified yes    Floyce Bujak, Trey Sailors  Remember to initiate Hypoglycemia Order Set & complete  Remember to initiate Hypoglycemia Order Set & complete

## 2012-02-09 NOTE — Progress Notes (Signed)
  Echocardiogram 2D Echocardiogram has been performed.  [name] [name] 02/09/2012, 6:46 PM

## 2012-02-09 NOTE — Progress Notes (Signed)
Subjective:   Patient ID: [name] male   DOB: 28-Sep-1975 37 y.o.   MRN: [id number]  HPI: [name] is a 37 y.o. man pmh poorly controlled DM and on long term anti-coagulation for DVTs and PEs presenting acutely to clinic for foot/toe pain. Pt stated that he has had acute weight gain may be within the past month has been both noticeable by himself and his girlfriend he feels is mostly localized in his legs and possibly body his regular measured weight even at home is 309 and patient is 349 today also per chart review patient's last recorded weight was 349. Patient has been self regulating warfarin for history of DVTs and PEs with no regular INR checks. And has had sudden acute worsening of unilateral edema in his right foot. Patient is also started vomiting and having nausea today but no diarrhea. This is accompanied by some subjective fevers and chills and patient is still taking his insulin. Patient has been noncompliant with his hypertension medication as he only had a one-time trial prescription but ran out last year. When asked about his CBG readings at home patient states measures 3 times a day and has within the past 2 days run as low as 38 with accompanied shakes and sweats. In regards to toe pain patient's MP nail fell off yesterday of his right foot and underneath has been weeping purulent drainage and is painful. Patient tried to apply Vaseline and compression stockings over right leg to try and decrease swelling.    Past Medical History  Diagnosis Date  . Diabetes mellitus   . DVT (deep venous thrombosis) 09/2002    patient reports additional DVTs in '06 & '11 (unconfirmed)  . History of hypertension     resolved after weight loss  . Pulmonary embolism 09/2002    treated with 6 months of warfarin   Current Outpatient Prescriptions  Medication Sig Dispense Refill  . insulin NPH-insulin regular (NOVOLIN 70/30) (70-30) 100 UNIT/ML injection Inject 40 Units into the skin 2  (two) times daily with a meal.  10 mL  0  . lisinopril (PRINIVIL,ZESTRIL) 10 MG tablet Take 1 tablet (10 mg total) by mouth daily.  30 tablet  0  . metFORMIN (GLUCOPHAGE) 500 MG tablet Take 2 tablets (1,000 mg total) by mouth 2 (two) times daily with a meal.  120 tablet  6  . warfarin (COUMADIN) 5 MG tablet Take as directed by anticoagulation clinic provider.  30 tablet  11   Family History  Problem Relation Age of Onset  . Heart disease    . Diabetes    . Obesity     History   Social History  . Marital Status: Single    Spouse Name: N/A    Number of Children: N/A  . Years of Education: N/A   Occupational History  . [employer]   . student    Social History Main Topics  . Smoking status: Never Smoker   . Smokeless tobacco: None  . Alcohol Use: Yes     Comment: occasional, q3w 2 beers  . Drug Use: No  . Sexually Active: None   Other Topics Concern  . None   Social History Narrative   Financial assistance approved for 100% discount at [facility] and has [facility] card per Deborah Hill8/24/2011.Lives in [name] with mother and sister.   Review of Systems: otherwise negative unless listed in HPI  Objective:  Physical Exam: Filed Vitals:   02/09/12 1333  BP: 165/91  Pulse: 93  Temp: 97.8 F (36.6 C)  TempSrc: Oral  Height: 6\' 1"  (1.854 m)  Weight: 349 lb 3.2 oz (158.396 kg)  SpO2: 98%    General: some moderate distress HEENT: PERRL, EOMI, no scleral icterus Cardiac: RRR, no rubs, murmurs or gallops Pulm: clear to auscultation bilaterally, moving normal volumes of air Abd: soft, nontender, nondistended, BS present Ext: warm and well perfused, R leg>L leg, right first MP toe nail totally removed with some weeping purulent drainage and surrounding erythema, maked edema in right ankle and tender to palpation, significant callus on base of right foot not open or draining, some edema in left leg slight and non-pitting Neuro: alert and oriented X3, cranial nerves II-XII grossly  intact  Assessment & Plan:  1. Unilateral leg swelling concern for acute clot in pt with substantial hx of clots. And recent weight gain.   2. Hypoglycemia: pt DM type 1 and recent hx of vomiting and chills with limited response to hypoglycemia in clinic.   3. HTN: acute elevation in state of pain. Pt on lisinopril. Will be receiving insurance tomorrow and wants to make f/u appt with PCP to address.   Pt was admitted for workup for acute clot and hypoglycemia management  Pt discussed with dr. Lynnae January

## 2012-02-09 NOTE — Progress Notes (Signed)
Pt arrived to floor 1630, pt alert oriented, pt asked to have cbg done. Pt felt hungry, shaky.  cbg 37, gave glucose gel and 2 orange juice, next cbg at 1725 57, notified md. Pt given d50 1 amp via iv. Pt also eating dinner. Will recheck cbg at 1800.

## 2012-02-09 NOTE — Progress Notes (Signed)
1455PM - NSL started in left forearm w/#22G angiocath; withdrew 10cc of blood for labs; flushed w/o difficulty. Site unremarkable.                   Remember to initiate Hypoglycemia Order Set & complete

## 2012-02-09 NOTE — Progress Notes (Signed)
ANTICOAGULATION CONSULT NOTE - Initial Consult  Pharmacy Consult for Warfarin/Enoxaparin  Indication: VTE treatment  No Known Allergies  Patient Measurements: Weight:  158.4 kg Height: 185 cm  Vital Signs: Temp: 97.8 F (36.6 C) (01/31 1333) Temp src: Oral (01/31 1333) BP: 165/91 mmHg (01/31 1333) Pulse Rate: 93  (01/31 1333)  Labs:  Basename 02/09/12 1455  HGB 14.0  HCT 42.7  PLT 315  APTT --  LABPROT 13.8  INR 1.1  HEPARINUNFRC --  CREATININE 0.97  CKTOTAL --  CKMB --  TROPONINI <0.30   Medical History: Past Medical History  Diagnosis Date  . Diabetes mellitus   . DVT (deep venous thrombosis) 09/2002    patient reports additional DVTs in '06 & '11 (unconfirmed)  . History of hypertension     resolved after weight loss  . Pulmonary embolism 09/2002    treated with 6 months of warfarin   Assessment: 37yo male with past medical history significant for DVTs and PE who is now being admitted with acute foot pain.  He is on chronic Warfarin therapy but with current INR of 1.1 there is some compliance issues.  His H/H are stable as well as platelets.  His renal function is good with a creatinine of 1.0 and an estimated clearance > 8ml/min.  He will need full dose Enoxaparin dosing with his Warfarin therapy.    Goal of Therapy:  INR 2-3 Monitor platelets by anticoagulation protocol: Yes   Plan:   - Enoxaparin 160 mg SQ every 12 hours  - Warfarin 10 mg x 1 tonight  - Daily PT/INR and CBC every 72 hours while on Evaro, PharmD., MS Clinical Pharmacist Pager:  [phone] Thank you for allowing pharmacy to be part of this patients care team. 02/09/2012,4:38 PM

## 2012-02-09 NOTE — Progress Notes (Signed)
VASCULAR LAB PRELIMINARY  PRELIMINARY  PRELIMINARY  PRELIMINARY  Bilateral lower extremity venous duplex  completed.    Preliminary report:  Bilateral:  No evidence of DVT, superficial thrombosis, or Baker's Cyst.    Seibert Keeter, RVT 02/09/2012, 6:28 PM

## 2012-02-10 ENCOUNTER — Other Ambulatory Visit: Payer: Self-pay

## 2012-02-10 ENCOUNTER — Encounter (HOSPITAL_COMMUNITY): Payer: Self-pay

## 2012-02-10 ENCOUNTER — Inpatient Hospital Stay (HOSPITAL_COMMUNITY): Payer: Self-pay

## 2012-02-10 LAB — BASIC METABOLIC PANEL
BUN: 13 mg/dL (ref 6–23)
Calcium: 9 mg/dL (ref 8.4–10.5)
GFR calc non Af Amer: >90 mL/min (ref >90)
Glucose, Bld: 89 mg/dL (ref 70–99)

## 2012-02-10 LAB — GLUCOSE, CAPILLARY
Glucose-Capillary: 119 mg/dL — ABNORMAL HIGH (ref 70–99)
Glucose-Capillary: 152 mg/dL — ABNORMAL HIGH (ref 70–99)
Glucose-Capillary: 160 mg/dL — ABNORMAL HIGH (ref 70–99)

## 2012-02-10 LAB — CBC
MCH: 29.3 pg (ref 26.0–34.0)
MCHC: 34.4 g/dL (ref 30.0–36.0)
MCV: 85.2 fL (ref 78.0–100.0)
Platelets: 293 10*3/uL (ref 150–400)

## 2012-02-10 LAB — TROPONIN I: Troponin I: <0.3 ng/mL (ref <0.30)

## 2012-02-10 MED ORDER — WARFARIN SODIUM 10 MG PO TABS
10.0000 mg | ORAL_TABLET | Freq: Once | ORAL | Status: AC
  Administered 2012-02-10: 10 mg via ORAL
  Filled 2012-02-10: qty 1

## 2012-02-10 MED ORDER — INFLUENZA VIRUS VACC SPLIT PF IM SUSP
0.5000 mL | INTRAMUSCULAR | Status: AC
  Administered 2012-02-12: 0.5 mL via INTRAMUSCULAR
  Filled 2012-02-10: qty 0.5

## 2012-02-10 MED ORDER — GADOBENATE DIMEGLUMINE 529 MG/ML IV SOLN
20.0000 mL | Freq: Once | INTRAVENOUS | Status: AC | PRN
  Administered 2012-02-10: 20 mL via INTRAVENOUS

## 2012-02-10 MED ORDER — INSULIN ASPART 100 UNIT/ML ~~LOC~~ SOLN
0.0000 [IU] | Freq: Three times a day (TID) | SUBCUTANEOUS | Status: DC
Start: 2012-02-10 — End: 2012-02-12
  Administered 2012-02-10 (×2): 3 [IU] via SUBCUTANEOUS
  Administered 2012-02-11: 8 [IU] via SUBCUTANEOUS
  Administered 2012-02-11 (×2): 3 [IU] via SUBCUTANEOUS
  Administered 2012-02-12 (×2): 5 [IU] via SUBCUTANEOUS
  Administered 2012-02-12: 07:00:00 via SUBCUTANEOUS

## 2012-02-10 NOTE — Progress Notes (Signed)
ANTICOAGULATION CONSULT NOTE - Initial Consult  Pharmacy Consult for Warfarin/Enoxaparin  Indication: VTE treatment  No Known Allergies  Patient Measurements: Weight:  158.4 kg Height: 185 cm  Vital Signs: Temp: 98.4 F (36.9 C) (02/01 0446) Temp src: Oral (02/01 0446) BP: 121/57 mmHg (02/01 0446) Pulse Rate: 79  (02/01 0446)  Labs:  Basename 02/10/12 0630 02/09/12 2149 02/09/12 1659 02/09/12 1455  HGB 13.9 -- -- 14.0  HCT 40.4 -- -- 42.7  PLT 293 -- -- 315  APTT -- -- -- --  LABPROT -- -- -- 13.8  INR -- -- -- 1.1  HEPARINUNFRC -- -- -- --  CREATININE 0.85 -- -- 0.97  CKTOTAL -- -- -- --  CKMB -- -- -- --  TROPONINI <0.30 <0.30 <0.30 --   Medical History: Past Medical History  Diagnosis Date  . Diabetes mellitus   . DVT (deep venous thrombosis) 09/2002    patient reports additional DVTs in '06 & '11 (unconfirmed)  . History of hypertension     resolved after weight loss  . Pulmonary embolism 09/2002    treated with 6 months of warfarin   Assessment: 54 YOM with hx of DVT/PE on lovenox bridge to coumadin. INR 1.1 on admission, No INR this morning, likely still bellow goal. Scr stable. No bleeding noted. B/L LE doppler negative for DVT  Home coumadin dose : 10mg  daily (compliance issues)  Goal of Therapy:  INR 2-3 Monitor platelets by anticoagulation protocol: Yes   Plan:   - Continue Enoxaparin 160 mg SQ every 12 hours  - Warfarin 10 mg x 1 tonight  - Daily PT/INR and CBC every 72 hours while on LMWH  Maryanna Shape, PharmD, BCPS  Clinical Pharmacist  Pager: [phone]   02/10/2012,11:32 AM

## 2012-02-10 NOTE — Progress Notes (Signed)
Subjective: [name] was seen and examined at bedside.  He has no complaints at this time. He denies any fever, chills, N/V/D, abdominal pain, chest pain, or shortness of breath at this time.    Objective: Vital signs in last 24 hours: Filed Vitals:   02/09/12 1651 02/09/12 2019 02/10/12 0446  BP: 157/82 141/76 121/57  Pulse: 91 81 79  Temp: 97.6 F (36.4 C) 97.3 F (36.3 C) 98.4 F (36.9 C)  TempSrc: Oral Oral Oral  Resp: 18 18 20   Height: 6\' 1"  (1.854 m)    Weight: 345 lb 0.3 oz (156.5 kg)  346 lb (156.945 kg)  SpO2: 99% 99% 100%   Weight change:   Intake/Output Summary (Last 24 hours) at 02/10/12 0758 Last data filed at 02/09/12 1900  Gross per 24 hour  Intake    360 ml  Output      0 ml  Net    360 ml   Vitals reviewed. General: resting in bed, NAD HEENT: PERRLA, EOMI, no scleral icterus Cardiac: RRR, no rubs, murmurs or gallops Pulm: clear to auscultation bilaterally, no wheezes, rales, or rhonchi Abd: soft, obese, nontender, nondistended, BS present Ext: warm and well perfused, no pedal edema, non-tender.  Swelling and erythema of R foot and ankle. R great toenail is absent with dried up draining from. Large callous of plantar skin under R MTP joing. L foot with callous on lateral arch, no fluid draining. Neuro: alert and oriented X3, cranial nerves II-XII grossly intact, strength and sensation to light touch equal in bilateral upper and lower extremities  Lab Results: Basic Metabolic Panel:  Lab [id number] 0630 02/09/12 1455  NA 139 138  K 3.7 4.1  CL 106 101  CO2 25 27  GLUCOSE 89 113*  BUN 13 18  CREATININE 0.85 0.97  CALCIUM 9.0 9.5  MG -- --  PHOS -- --   Liver Function Tests:  Lab 02/09/12 1455  AST 18  ALT 20  ALKPHOS 84  BILITOT 0.3  PROT 7.6  ALBUMIN 3.1*   CBC:  Lab 02/10/12 0630 02/09/12 1455  WBC 7.8 6.4  NEUTROABS -- 3.3  HGB 13.9 14.0  HCT 40.4 42.7  MCV 85.2 84.7  PLT 293 315   Cardiac Enzymes:  Lab 02/10/12 0630 02/09/12  2149 02/09/12 1659  CKTOTAL -- -- --  CKMB -- -- --  CKMBINDEX -- -- --  TROPONINI <0.30 <0.30 <0.30   BNP:  Lab 02/09/12 1659  PROBNP 87.6   CBG:  Lab 02/10/12 0634 02/10/12 0444 02/10/12 0238 02/10/12 0013 02/09/12 2210 02/09/12 2015  GLUCAP 90 114* 119* 152* 135* 83   Hemoglobin A1C:  Lab 02/09/12 1353  HGBA1C 8.2   Thyroid Function Tests:  Lab 02/09/12 1455  TSH 1.574  T4TOTAL --  FREET4 --  T3FREE --  THYROIDAB --   Coagulation:  Lab 02/09/12 1455  LABPROT 13.8  INR 1.1   Urine Drug Screen: Drugs of Abuse     Component Value Date/Time   LABOPIA POSITIVE* 07/12/2011 0038   COCAINSCRNUR NONE DETECTED 07/12/2011 0038   LABBENZ NONE DETECTED 07/12/2011 0038   AMPHETMU NONE DETECTED 07/12/2011 0038   THCU NONE DETECTED 07/12/2011 0038   LABBARB NONE DETECTED 07/12/2011 0038    Urinalysis:  Lab 02/09/12 1517  COLORURINE YELLOW  LABSPEC 1.029  PHURINE 5.5  GLUCOSEU NEG  HGBUR SMALL*  BILIRUBINUR NEG  KETONESUR NEG  PROTEINUR 300*  UROBILINOGEN 1  NITRITE NEG  LEUKOCYTESUR NEG   Micro Results:  Recent Results (from the past 240 hour(s))  WOUND CULTURE     Status: Normal (Preliminary result)   Collection Time   02/09/12  8:21 PM      Component Value Range Status Comment   Specimen Description WOUND RIGHT TOE   Final    Special Requests NONE   Final    Gram Stain     Final    Value: NO WBC SEEN     FEW SQUAMOUS EPITHELIAL CELLS PRESENT     FEW GRAM POSITIVE RODS     FEW GRAM POSITIVE COCCI IN PAIRS     RARE GRAM NEGATIVE RODS   Culture NO GROWTH   Final    Report Status PENDING   Incomplete    Studies/Results: X-ray Chest Pa And Lateral   02/09/2012  *RADIOLOGY REPORT*  Clinical Data: Admission for diabetic wound  CHEST - 2 VIEW  Comparison: 06/17/2011  Findings: Low lung volumes.  No focal consolidation. No pleural effusion or pneumothorax.  The heart is normal in size for inspiration.  IMPRESSION: No evidence of acute cardiopulmonary disease.   Original  Report Authenticated By: Julian Hy, M.D.    Dg Foot Complete Right  02/09/2012  *RADIOLOGY REPORT*  Clinical Data: Wound on right big toe, evaluate for osteomyelitis  RIGHT FOOT COMPLETE - 3+ VIEW  Comparison: None.  Findings: Soft tissue swelling/irregularity overlying the first distal phalanx.  No underlying cortical irregularity to suggest osteomyelitis.  No fracture or dislocation is seen.  IMPRESSION: No radiographic findings to suggest osteomyelitis.   Original Report Authenticated By: Julian Hy, M.D.    Medications: I have reviewed the patient's current medications. Scheduled Meds:   . enoxaparin (LOVENOX) injection  160 mg Subcutaneous Q12H  . lisinopril  10 mg Oral Daily  . sodium chloride  3 mL Intravenous Q12H  . sodium chloride  3 mL Intravenous Q12H  . Warfarin - Pharmacist Dosing Inpatient   Does not apply q1800   Continuous Infusions:  PRN Meds:.sodium chloride, acetaminophen, acetaminophen, ondansetron (ZOFRAN) IV, ondansetron, sodium chloride  Assessment/Plan: [name] is a 37 y.o. man pmh poorly controlled DM and on long term anti-coagulation for DVTs and PEs presenting acutely to clinic with complaints of weight gain and R foot pain and swelling.   1) R foot/ankle swelling and pain--acute onset swelling of R ankle and foot with no history of antecedent trauma.  He has h/o DVTs, is erratic with warfarin use and subtherapeutic w INR 1.1. He does not have any calf edema or asymmetry. Prelim doppler study negative for DVT.  Also of concern is loss of R toenail with some drainage of purulent fluid from nailbed.  He has a diabetic ulcer of R great toe which has been infected previously but he has never had osteomyelitis.  Low suspicion for cellulitis or osteomyelitis based on examination.  Appearance of toe consistent with paronychia; however, it is possible that deeper tissue infection is obscured at this time by hyperkeratotic callous. No evidence of systemic  infection at this time (normal HR, BP, no leukocytosis).  - Will anticoagulate with lovenox and warfarin. Patient will require bridging to therapeutic INR.  - Bilateral LE dopplers--prelim neg for DVT  - Xray of R ankle no evidence of OM - MRI R foot r/o OM - Culture of purulent fluid from R great toe--no growth - Hold on antibiotics for now--consider doxycycline for toe  2) Weight gain--40lb weight gain since July. He does have swelling of feet but no pitting edema  of calves.  No rales on exam to suggest pulmonary edema.  Echo in 2004 shows EF 50-55%. He is hypertensive today to 165/91.  Elevated urine total protein--384 - CXR no evidence of acute cardiopulmonary disease, pro-BNP 87.6, and trop x4 neg - F/U Echo  - No clear acute decompensated HF, will hold off on diuresis for now  - Lisinopril 10mg  (prior home dose) for HTN  - 24 hour urine protein and creatine studies   3) Insulin dependent DM with symptomatic hypoglycemia--on metformin 500mg  BID and Novolin 70/30 40 units BID at home.  Blood sugar dropped to 46 on day of admission after patient administered insulin with minimal intake. Blood sugar increased to 114 after receiving glucose gel in clinic. Suspect drop in blood sugar related to inappropriate insulin administrating in setting of decreased po intake. Hb A1C 8.2 02/09/12 - Hold long-acting insulin.  - initially held sliding scale given degree of hypoglycemia--will restart SSI moderate  4)HTN--on lisinopril. BP 165/91 on admission, pt in acute pain,  - Continue lisinopril   Dispo: Disposition is deferred at this time, awaiting improvement of current medical problems. Anticipated discharge in approximately 1-2 day(s).  The patient does have a current PCP (PRIBULA,CHRISTOPHER, MD), therefore will be requiring OPC follow-up after discharge.  The patient does not have transportation limitations that hinder transportation to clinic appointments.  Services Needed at time of discharge:  Y = Yes, Blank = No PT:   OT:   RN:   Equipment:   Other:     LOS: 1 day   Jerene Pitch 02/10/2012, 7:58 AM

## 2012-02-11 DIAGNOSIS — IMO0002 Reserved for concepts with insufficient information to code with codable children: Secondary | ICD-10-CM

## 2012-02-11 LAB — CREATININE, URINE, 24 HOUR
Creatinine, 24H Ur: 2466 mg/d — ABNORMAL HIGH (ref 800–2000)
Creatinine, Urine: 123.32 mg/dL
Urine Total Volume-UCRE24: 2000 mL

## 2012-02-11 LAB — GLUCOSE, CAPILLARY
Glucose-Capillary: 200 mg/dL — ABNORMAL HIGH (ref 70–99)
Glucose-Capillary: 261 mg/dL — ABNORMAL HIGH (ref 70–99)

## 2012-02-11 LAB — PROTIME-INR
INR: 1.46 (ref 0.00–1.49)
Prothrombin Time: 17.3 seconds — ABNORMAL HIGH (ref 11.6–15.2)

## 2012-02-11 LAB — PROTEIN, URINE, 24 HOUR: Protein, 24H Urine: 3560 mg/d — ABNORMAL HIGH (ref 50–100)

## 2012-02-11 MED ORDER — DOXYCYCLINE HYCLATE 100 MG PO TABS
100.0000 mg | ORAL_TABLET | Freq: Two times a day (BID) | ORAL | Status: DC
  Administered 2012-02-11 – 2012-02-12 (×2): 100 mg via ORAL
  Filled 2012-02-11 (×3): qty 1

## 2012-02-11 MED ORDER — WARFARIN SODIUM 10 MG PO TABS
10.0000 mg | ORAL_TABLET | Freq: Once | ORAL | Status: AC
  Administered 2012-02-11: 10 mg via ORAL
  Filled 2012-02-11: qty 1

## 2012-02-11 NOTE — Progress Notes (Signed)
ANTICOAGULATION CONSULT NOTE - Initial Consult  Pharmacy Consult for Warfarin/Enoxaparin  Indication: VTE treatment  No Known Allergies  Patient Measurements: Weight:  158.4 kg Height: 185 cm  Vital Signs: Temp: 98.4 F (36.9 C) (02/01 0446) Temp src: Oral (02/01 0446) BP: 121/57 mmHg (02/01 0446) Pulse Rate: 79  (02/01 0446)  Labs:  Basename 02/10/12 0630 02/09/12 2149 02/09/12 1659 02/09/12 1455  HGB 13.9 -- -- 14.0  HCT 40.4 -- -- 42.7  PLT 293 -- -- 315  APTT -- -- -- --  LABPROT -- -- -- 13.8  INR -- -- -- 1.1  HEPARINUNFRC -- -- -- --  CREATININE 0.85 -- -- 0.97  CKTOTAL -- -- -- --  CKMB -- -- -- --  TROPONINI <0.30 <0.30 <0.30 --   Medical History: Past Medical History  Diagnosis Date  . Diabetes mellitus   . DVT (deep venous thrombosis) 09/2002    patient reports additional DVTs in '06 & '11 (unconfirmed)  . History of hypertension     resolved after weight loss  . Pulmonary embolism 09/2002    treated with 6 months of warfarin   Assessment: 22 YOM with hx of DVT/PE on lovenox bridge to coumadin. INR 1.1 on admission, No INR this morning, likely still bellow goal. Scr stable. No bleeding noted. INR took a nice jump today. Pt was supposedly on coumadin PTA but likely non-compliance.   Home coumadin dose : 10mg  daily (compliance issues)  Goal of Therapy:  INR 2-3 Monitor platelets by anticoagulation protocol: Yes   Plan:   - Continue Enoxaparin 160 mg SQ every 12 hours  - Warfarin 10 mg x 1 tonight  - Daily PT/INR and CBC every 72 hours while on LMWH

## 2012-02-11 NOTE — Progress Notes (Addendum)
Subjective: Patient feels okay.  Denies any fever, chills, nausea, vomiting, abdomen pain, chest pain or shortness breath at this time.  No acute events overnight. Objective: Vital signs in last 24 hours: Filed Vitals:   02/10/12 1348 02/10/12 2009 02/11/12 0410 02/11/12 1400  BP: 152/72 122/63 145/81 132/68  Pulse: 79 78 79 88  Temp: 98.6 F (37 C) 97.5 F (36.4 C) 98 F (36.7 C) 97.6 F (36.4 C)  TempSrc: Oral Oral Oral Oral  Resp: 20 19 18 18   Height:      Weight:   344 lb 11.2 oz (156.355 kg)   SpO2: 96% 100% 100% 100%   Weight change: -5.1 oz (-0.145 kg) No intake or output data in the 24 hours ending 02/11/12 1750 Vitals reviewed.  General: resting in bed, NAD  HEENT: PERRLA, EOMI, no scleral icterus  Cardiac: RRR, no rubs, murmurs or gallops  Pulm: clear to auscultation bilaterally, no wheezes, rales, or rhonchi  Abd: soft, obese, nontender, nondistended, BS present  Ext: warm and well perfused, no pedal edema, non-tender. Swelling and erythema of R foot and ankle. R great toenail is absent with dried up draining from. Large callous of plantar skin under R MTP joing. L foot with callous on lateral arch, no fluid draining.  Neuro: alert and oriented X3, cranial nerves II-XII grossly intact, strength and sensation to light touch equal in bilateral upper and lower extremities  Lab Results: Basic Metabolic Panel:  Lab [id number] 0630 02/09/12 1455  [place] 139 138  K 3.7 4.1  CL 106 101  CO2 25 27  GLUCOSE 89 113*  BUN 13 18  CREATININE 0.85 0.97  CALCIUM 9.0 9.5  MG -- --  PHOS -- --   Liver Function Tests:  Lab 02/09/12 1455  AST 18  ALT 20  ALKPHOS 84  BILITOT 0.3  PROT 7.6  ALBUMIN 3.1*   CBC:  Lab 02/10/12 0630 02/09/12 1455  WBC 7.8 6.4  NEUTROABS -- 3.3  HGB 13.9 14.0  HCT 40.4 42.7  MCV 85.2 84.7  PLT 293 315   Cardiac Enzymes:  Lab 02/10/12 0630 02/09/12 2149 02/09/12 1659  CKTOTAL -- -- --  CKMB -- -- --  CKMBINDEX -- -- --  TROPONINI <0.30  <0.30 <0.30   BNP:  Lab 02/09/12 1659  PROBNP 87.6   CBG:  Lab 02/11/12 1558 02/11/12 1114 02/11/12 0622 02/10/12 2046 02/10/12 1810 02/10/12 1612  GLUCAP 261* 200* 164* 238* 160* 164*   Hemoglobin A1C:  Lab 02/09/12 1353  HGBA1C 8.2   Thyroid Function Tests:  Lab 02/09/12 1455  TSH 1.574  T4TOTAL --  FREET4 --  T3FREE --  THYROIDAB --   Coagulation:  Lab 02/11/12 0635 02/09/12 1455  LABPROT 17.3* 13.8  INR 1.46 1.1   Urine Drug Screen: Drugs of Abuse     Component Value Date/Time   LABOPIA POSITIVE* 07/12/2011 0038   COCAINSCRNUR NONE DETECTED 07/12/2011 0038   LABBENZ NONE DETECTED 07/12/2011 0038   AMPHETMU NONE DETECTED 07/12/2011 0038   THCU NONE DETECTED 07/12/2011 0038   LABBARB NONE DETECTED 07/12/2011 0038    Urinalysis:  Lab 02/09/12 1517  COLORURINE YELLOW  LABSPEC 1.029  PHURINE 5.5  GLUCOSEU NEG  HGBUR SMALL*  BILIRUBINUR NEG  KETONESUR NEG  PROTEINUR 300*  UROBILINOGEN 1  NITRITE NEG  LEUKOCYTESUR NEG   Micro Results: Recent Results (from the past 240 hour(s))  WOUND CULTURE     Status: Normal (Preliminary result)   Collection Time  02/09/12  8:21 PM      Component Value Range Status Comment   Specimen Description WOUND RIGHT TOE   Final    Special Requests NONE   Final    Gram Stain     Final    Value: NO WBC SEEN     FEW SQUAMOUS EPITHELIAL CELLS PRESENT     FEW GRAM POSITIVE RODS     FEW GRAM POSITIVE COCCI IN PAIRS     RARE GRAM NEGATIVE RODS   Culture Culture reincubated for better growth   Final    Report Status PENDING   Incomplete    Studies/Results: X-ray Chest Pa And Lateral   02/09/2012  *RADIOLOGY REPORT*  Clinical Data: Admission for diabetic wound  CHEST - 2 VIEW  Comparison: 06/17/2011  Findings: Low lung volumes.  No focal consolidation. No pleural effusion or pneumothorax.  The heart is normal in size for inspiration.  IMPRESSION: No evidence of acute cardiopulmonary disease.   Original Report Authenticated By: Julian Hy, M.D.    Mr Foot Right W Wo Contrast  02/11/2012  *RADIOLOGY REPORT*  Clinical Data: Osteomyelitis.  Abscess. Plantar foot ulcer. Diabetic patient.  MRI OF THE RIGHT FOREFOOT WITHOUT AND WITH CONTRAST  Technique:  Multiplanar, multisequence MR imaging was performed both before and after administration of intravenous contrast.  Contrast: 23mL MULTIHANCE GADOBENATE DIMEGLUMINE 529 MG/ML IV SOLN  Comparison: Radiographs 02/09/2012.  Findings: There is attenuation of the plantar soft tissues overlying the third and fourth MTP joints.  Infiltration of subcutaneous tissues is present overlying the plantar foot wound. There is no osteomyelitis.  No abscess.  Diabetic myositis is present.  Reactive fluid is present in the flexor Hallucis longus tendon sheath around the arch of the foot.  Diffuse edema of the foot is present, suggesting cellulitis in the appropriate clinical setting.  Mild midfoot degenerative changes are present.  IMPRESSION: No osteomyelitis or abscess. Thinning of the plantar soft tissues over the fourth and fifth MTP joints likely represents ulceration. Diffuse edema of the foot which may represent dependent edema, cellulitis in the appropriate clinical setting, the abnormal signal in the soft tissues is likely a combination of all three entities.   Original Report Authenticated By: Dereck Ligas, M.D.    Dg Foot Complete Right  02/09/2012  *RADIOLOGY REPORT*  Clinical Data: Wound on right big toe, evaluate for osteomyelitis  RIGHT FOOT COMPLETE - 3+ VIEW  Comparison: None.  Findings: Soft tissue swelling/irregularity overlying the first distal phalanx.  No underlying cortical irregularity to suggest osteomyelitis.  No fracture or dislocation is seen.  IMPRESSION: No radiographic findings to suggest osteomyelitis.   Original Report Authenticated By: Julian Hy, M.D.    Medications: I have reviewed the patient's current medications. Scheduled Meds:   . doxycycline  100 mg Oral  Q12H  . enoxaparin (LOVENOX) injection  160 mg Subcutaneous Q12H  . influenza  inactive virus vaccine  0.5 mL Intramuscular Tomorrow-1000  . insulin aspart  0-15 Units Subcutaneous TID WC  . lisinopril  10 mg Oral Daily  . sodium chloride  3 mL Intravenous Q12H  . sodium chloride  3 mL Intravenous Q12H  . warfarin  10 mg Oral ONCE-1800  . Warfarin - Pharmacist Dosing Inpatient   Does not apply q1800   Continuous Infusions:  PRN Meds:.sodium chloride, acetaminophen, acetaminophen, ondansetron (ZOFRAN) IV, ondansetron, sodium chloride Assessment/Plan:  [name] is a 37 y.o. man pmh poorly controlled DM and on long term anti-coagulation for DVTs and PEs  presenting acutely to clinic with complaints of weight gain and R foot pain and swelling.   1) R foot/ankle cellulitis -acute onset swelling of R ankle and foot with no history of antecedent trauma.  Prelim doppler study negative for DVT. MRI of the right foot rule out osteomyelitis.    - Culture of purulent fluid from R great toe--no growth  - Doxycycline 100 twice a day  2) chronic anticoagulation therapy for repeated DVT and PE. Patient is under coagulated due to medical noncompliance    - Will anticoagulate with lovenox and warfarin. Patient will require bridging to therapeutic INR.  - INR 1.46 today - Warfarin per pharmacy  3) Nephrotic syndrome  - 3560 protein/24 hour urine - renal consult in am  4) Insulin dependent DM with symptomatic hypoglycemia--on metformin 500mg  BID and Novolin 70/30 40 units BID at home. Blood sugar dropped to 46 on day of admission after patient administered insulin with minimal intake. Blood sugar increased to 114 after receiving glucose gel in clinic. Suspect drop in blood sugar related to inappropriate insulin administrating in setting of decreased po intake. Hb A1C 8.2 02/09/12  - Hold long-acting insulin.  - initially held sliding scale given degree of hypoglycemia--will restart SSI moderate    4)HTN--on lisinopril. BP 165/91 on admission, pt in acute pain,  - Continue lisinopril   Dispo: Disposition is deferred at this time, awaiting improvement of current medical problems. Anticipated discharge in approximately 1-2 day(s).  The patient does have a current PCP (PRIBULA,CHRISTOPHER, MD), therefore will be requiring OPC follow-up after discharge.  The patient does not have transportation limitations that hinder transportation to clinic appointments   .Services Needed at time of discharge: Y = Yes, Blank = No PT:   OT:   RN:   Equipment:   Other:     LOS: 2 days   Norleen Xie 02/11/2012, 5:50 PM

## 2012-02-12 DIAGNOSIS — N049 Nephrotic syndrome with unspecified morphologic changes: Secondary | ICD-10-CM | POA: Diagnosis not present

## 2012-02-12 LAB — GLUCOSE, CAPILLARY
Glucose-Capillary: 244 mg/dL — ABNORMAL HIGH (ref 70–99)
Glucose-Capillary: 227 mg/dL — ABNORMAL HIGH (ref 70–99)

## 2012-02-12 LAB — BASIC METABOLIC PANEL
BUN: 11 mg/dL (ref 6–23)
Calcium: 9 mg/dL (ref 8.4–10.5)
GFR calc non Af Amer: >90 mL/min (ref >90)
Glucose, Bld: 187 mg/dL — ABNORMAL HIGH (ref 70–99)

## 2012-02-12 LAB — CBC
HCT: 42 % (ref 39.0–52.0)
Hemoglobin: 14.4 g/dL (ref 13.0–17.0)
MCH: 28.9 pg (ref 26.0–34.0)
MCHC: 34.3 g/dL (ref 30.0–36.0)

## 2012-02-12 MED ORDER — WARFARIN SODIUM 10 MG PO TABS
10.0000 mg | ORAL_TABLET | Freq: Once | ORAL | Status: AC
  Administered 2012-02-12: 10 mg via ORAL
  Filled 2012-02-12: qty 1

## 2012-02-12 MED ORDER — POTASSIUM CHLORIDE CRYS ER 10 MEQ PO TBCR
10.0000 meq | EXTENDED_RELEASE_TABLET | Freq: Every day | ORAL | Status: DC
  Administered 2012-02-12: 10 meq via ORAL
  Filled 2012-02-12: qty 1

## 2012-02-12 MED ORDER — FUROSEMIDE 20 MG PO TABS
20.0000 mg | ORAL_TABLET | Freq: Every day | ORAL | Status: DC
  Administered 2012-02-12: 20 mg via ORAL
  Filled 2012-02-12: qty 1

## 2012-02-12 MED ORDER — LISINOPRIL 10 MG PO TABS: 10.0000 mg | ORAL_TABLET | Freq: Every day | ORAL | Status: DC

## 2012-02-12 MED ORDER — DOXYCYCLINE HYCLATE 100 MG PO TABS: 100.0000 mg | ORAL_TABLET | Freq: Two times a day (BID) | ORAL | Status: DC

## 2012-02-12 MED ORDER — FUROSEMIDE 20 MG PO TABS: 20.0000 mg | ORAL_TABLET | Freq: Every day | ORAL | Status: DC

## 2012-02-12 MED ORDER — POTASSIUM CHLORIDE CRYS ER 10 MEQ PO TBCR: 10.0000 meq | EXTENDED_RELEASE_TABLET | Freq: Every day | ORAL | Status: DC

## 2012-02-12 NOTE — Progress Notes (Signed)
Internal Medicine Teaching Service Attending Note Date: 02/12/2012  Patient name: [name]  Medical record number: [id number]  Date of birth: 06-Dec-1975    This patient has been seen and discussed with the house staff. Please see their note for complete details. I concur with their findings with the following additions/corrections:  37 year old with admission with right foot pain swelling, with hx of recurrent DVT's PE  found to have paronychia and nephrotic syndrome.  #1 nephrotic syndrome We will discuss case with Nephrology to see whether they would prefer to see him formally as an inpatient or see him as an outpatient. He is on 40 mg lisinopril.   #2 paronychia: Finish 10 days of doxycycline.  #3 diabetes mellitus: Okay with restarting low-dose NPH.  #4 recurrent DVT's PEs: no evidence of acute DVT or PE. Pt did have some DOE yesterday will ambulate him with POX. He is currently on lovenox bridge with coumadin     Rhina Brackett Dam 02/12/2012, 11:07 AM

## 2012-02-12 NOTE — Progress Notes (Signed)
Inpatient Diabetes Program Recommendations  AACE/ADA: New Consensus Statement on Inpatient Glycemic Control (2013)  Target Ranges:  Prepandial:   less than 140 mg/dL      Peak postprandial:   less than 180 mg/dL (1-2 hours)      Critically ill patients:  140 - 180 mg/dL    Inpatient Diabetes Program Recommendations Insulin - Basal: Please consider ordering NPH; recommend NPH 10 units BID and adjust accordingly to improve glycemic control.  Note: Patient has a history of diabetes and takes NPH 40 units BID and Metformin 1000 mg BID at home for diabetes management.  Initially patient presented with hypoglycemia that required treatment, therefore, longer acting insulins were not ordered.  Blood glucose over the past 24 hours has ranged from 164-261 mg/dl.  Please consider restarting NPH; recommend NPH 10 units BID and adjust accordingly to improve glycemic control.  Will continue to follow.  Thanks, Barnie Alderman, RN, BSN, [place] [facility] Coordinator Inpatient Diabetes Program [phone]

## 2012-02-12 NOTE — Progress Notes (Signed)
ANTICOAGULATION CONSULT NOTE - Follow Up Consult  Pharmacy Consult for Coumadin / Lovenox Indication: VTE treatment  No Known Allergies  Labs:  Basename 02/12/12 0555 02/11/12 0635 02/10/12 0630 02/09/12 2149 02/09/12 1659 02/09/12 1455  HGB 14.4 -- 13.9 -- -- --  HCT 42.0 -- 40.4 -- -- 42.7  PLT 286 -- 293 -- -- 315  APTT -- -- -- -- -- --  LABPROT 17.9* 17.3* -- -- -- 13.8  INR 1.52* 1.46 -- -- -- 1.1  HEPARINUNFRC -- -- -- -- -- --  CREATININE 0.84 -- 0.85 -- -- 0.97  CKTOTAL -- -- -- -- -- --  CKMB -- -- -- -- -- --  TROPONINI -- -- <0.30 <0.30 <0.30 --    Estimated Creatinine Clearance: 191.5 ml/min (by C-G formula based on Cr of 0.84).  Assessment: 37 year old male with a history of DVT / PE on lovenox bridge to Coumadin.  No bleeding noted, no new clots  INR slowly trending up.    Goal of Therapy:  INR 2-3 Monitor platelets by anticoagulation protocol: Yes   Plan:  1) Lovenox 160 mg sq q 12 hours 2) Coumadin 10 mg po x 1 dose tonight 3) Follow up AM INR  Thank you. Anette Guarneri, PharmD [phone]  02/12/2012,9:16 AM

## 2012-02-12 NOTE — Care Management Note (Signed)
    Page 1 of 1   02/12/2012     3:21:16 PM   CARE MANAGEMENT NOTE 02/12/2012  Patient:  [name], [name]   Account Number:  [id number]  Date Initiated:  02/12/2012  Documentation initiated by:  Christhoper Busbee  Subjective/Objective Assessment:   PT ADM WITH LOWER EXTREMITY DVT.  PTA, PT LIVES WITH FAMILY.     Action/Plan:   PT TO DC HOME ON LOVENOX.  HE HAS NO INSURANCE.  PT ELIGIBLE FOR MED ASSISTANCE THROUGH [facility].   Anticipated DC Date:  02/12/2012   Anticipated DC Plan:  [place]  CM consult  [facility]      Choice offered to / List presented to:             Status of service:  Completed, signed off Medicare Important Message given?   (If response is "NO", the following Medicare IM given date fields will be blank) Date Medicare IM given:   Date Additional Medicare IM given:    Discharge Disposition:  HOME/SELF CARE  Per UR Regulation:  Reviewed for med. necessity/level of care/duration of stay  If discussed at [place] of Stay Meetings, dates discussed:    Comments:  02/12/12 Marialy Urbanczyk,RN,BSN [id number] [place] TO PT.  PT STATES HE CAN AFFORD $3 COPAY .  HE IS APPRECIATIVE OF ASSISTANCE.

## 2012-02-12 NOTE — Discharge Summary (Signed)
[facility] Discharge Note  Name: [name] MRN: [id number] DOB: January 17, 1975 37 y.o.  Date of Admission: 02/09/2012  4:41 PM Date of Discharge: 02/12/2012 Attending Physician: Dr. Tommy Medal Discharge Diagnosis: Principal Problem:  *Fluid overload Active Problems:  DM (diabetes mellitus) type I uncontrolled, periph vascular disorder  Morbid obesity  DVT, HX OF  Long term (current) use of anticoagulants  HTN (hypertension)  Right foot pain  Discharge Medications:   Medication List     As of 02/12/2012  6:26 PM    TAKE these medications         doxycycline 100 MG tablet   Commonly known as: VIBRA-TABS   Take 1 tablet (100 mg total) by mouth every 12 (twelve) hours.      furosemide 20 MG tablet   Commonly known as: LASIX   Take 1 tablet (20 mg total) by mouth daily.      insulin NPH-insulin regular (70-30) 100 UNIT/ML injection   Commonly known as: NOVOLIN 70/30   Inject 40 Units into the skin 2 (two) times daily with a meal.      lisinopril 10 MG tablet   Commonly known as: PRINIVIL,ZESTRIL   Take 1 tablet (10 mg total) by mouth daily.      metFORMIN 500 MG tablet   Commonly known as: GLUCOPHAGE   Take 2 tablets (1,000 mg total) by mouth 2 (two) times daily with a meal.      potassium chloride 10 MEQ tablet   Commonly known as: K-DUR,KLOR-CON   Take 1 tablet (10 mEq total) by mouth daily.      warfarin 5 MG tablet   Commonly known as: COUMADIN   Take 10 mg by mouth daily.       Disposition and follow-up:   [name] was discharged from [facility] in stable condition.  At the hospital follow up visit please address DM--adjust insulin dose as needed Nephrotic syndrome--renal follow up appt with Dr. Lorrene Reid.  Discharged on lasix 20 qd and potassium 81meq qd.  Monitor electrolytes on lasix--check bmet 2/6.   Anticoagulation--hx of non-compliance.  Discharged with INR 1.5 on coumadin.  appt with dr Elie Confer on  Thursday 2/6.  Compliance strongly encouraged.  Right foot ulcer--consider foot center visit.  D/c on doxycyline.   HTN--discharged on lisinopril--non-compliant in the past.    Follow-up Appointments:     Follow-up Information    Follow up with SAWHNEY,MEGHA, MD. On 02/19/2012. (@2pm  with Dr, Joylene John @3pm  Dr. Elie Confer)    Contact information:   [place] [address] [phone]       Follow up with Ceasar Lund, Eustaquio Boyden, PHARMD. On 02/15/2012. (2pm)    Contact information:   - - - - [place] [address] [phone]       Follow up with Lucrezia Starch, MD. On 02/22/2012. (@315 )    Contact information:   [place] [address] [phone]         Discharge Orders    Future Appointments: Provider: Department: Dept Phone: Center:   02/19/2012 2:00 PM Pedro Earls, MD [place] [phone] [facility]   02/19/2012 3:00 PM Imp-Imcr Coumadin [place] [phone] [facility]     Future Orders Please Complete By Expires   Diet - low sodium heart healthy      Increase activity slowly      Call MD for:  difficulty breathing, headache or visual disturbances  Call MD for:  severe uncontrolled pain      Call MD for:  redness, tenderness, or signs of infection (pain, swelling, redness, odor or green/yellow discharge around incision site)        Procedures Performed:  X-ray Chest Pa And Lateral   02/09/2012  *RADIOLOGY REPORT*  Clinical Data: Admission for diabetic wound  CHEST - 2 VIEW  Comparison: 06/17/2011  Findings: Low lung volumes.  No focal consolidation. No pleural effusion or pneumothorax.  The heart is normal in size for inspiration.  IMPRESSION: No evidence of acute cardiopulmonary disease.   Original Report Authenticated By: Julian Hy, M.D.    Mr Foot Right W Wo Contrast  02/11/2012  *RADIOLOGY REPORT*  Clinical Data: Osteomyelitis.  Abscess. Plantar foot ulcer.  Diabetic patient.  MRI OF THE RIGHT FOREFOOT WITHOUT AND WITH CONTRAST  Technique:  Multiplanar, multisequence MR imaging was performed both before and after administration of intravenous contrast.  Contrast: 15mL MULTIHANCE GADOBENATE DIMEGLUMINE 529 MG/ML IV SOLN  Comparison: Radiographs 02/09/2012.  Findings: There is attenuation of the plantar soft tissues overlying the third and fourth MTP joints.  Infiltration of subcutaneous tissues is present overlying the plantar foot wound. There is no osteomyelitis.  No abscess.  Diabetic myositis is present.  Reactive fluid is present in the flexor Hallucis longus tendon sheath around the arch of the foot.  Diffuse edema of the foot is present, suggesting cellulitis in the appropriate clinical setting.  Mild midfoot degenerative changes are present.  IMPRESSION: No osteomyelitis or abscess. Thinning of the plantar soft tissues over the fourth and fifth MTP joints likely represents ulceration. Diffuse edema of the foot which may represent dependent edema, cellulitis in the appropriate clinical setting, the abnormal signal in the soft tissues is likely a combination of all three entities.   Original Report Authenticated By: Dereck Ligas, M.D.    Dg Foot Complete Right  02/09/2012  *RADIOLOGY REPORT*  Clinical Data: Wound on right big toe, evaluate for osteomyelitis  RIGHT FOOT COMPLETE - 3+ VIEW  Comparison: None.  Findings: Soft tissue swelling/irregularity overlying the first distal phalanx.  No underlying cortical irregularity to suggest osteomyelitis.  No fracture or dislocation is seen.  IMPRESSION: No radiographic findings to suggest osteomyelitis.   Original Report Authenticated By: Julian Hy, M.D.    2D Echo:  Transthoracic Echocardiography  Patient: Victor, Kelley MR #: [id number] Study Date: 02/09/2012 Gender: M Age: 44 Height: 185.4cm Weight: 156.5kg BSA: 2.21m^2 Pt. Status: Room: 2004  [place], [facility] ATTENDING Tommy Medal, [place] cc:  ------------------------------------------------------------ LV EF: 55% - 60%  ------------------------------------------------------------ Indications: h/o Pulmonary embolus 415.19.  ------------------------------------------------------------ History: PMH: Syncope. Risk factors: Hypertension. Diabetes mellitus. Morbidly obese.  ------------------------------------------------------------ Study Conclusions  - Left ventricle: The cavity size was normal. Wall thickness was normal. Systolic function was normal. The estimated ejection fraction was in the range of 55% to 60%. Wall motion was normal; there were no regional wall motion abnormalities. Left ventricular diastolic function parameters were normal. - Aortic valve: There was no stenosis. - Mitral valve: Trivial regurgitation. - Left atrium: The atrium was mildly dilated. - Right ventricle: The cavity size was normal. Systolic function was normal. - Pulmonary arteries: No complete TR doppler jet so unable to estimate PA systolic pressure. - Inferior vena cava: The vessel was normal in size; the respirophasic diameter changes were in the normal range (= 50%); findings are consistent with normal central venous pressure.  Impressions:  - Normal echo except for mild left atrial enlargement. Transthoracic echocardiography. M-mode, complete 2D, spectral Doppler, and color Doppler. Height: Height: 185.4cm. Height: 73in. Weight: Weight: 156.5kg. Weight: 344.3lb. Body mass index: BMI: 45.5kg/m^2. Body surface area: BSA: 2.61m^2. Blood pressure: 157/82. Patient status: Inpatient. Location: Bedside.  ------------------------------------------------------------  ------------------------------------------------------------ Left ventricle: The cavity size was normal. Wall thickness was normal. Systolic function was normal. The  estimated ejection fraction was in the range of 55% to 60%. Wall motion was normal; there were no regional wall motion abnormalities. The transmitral flow pattern was normal. The deceleration time of the early transmitral flow velocity was normal. The pulmonary vein flow pattern was normal. The tissue Doppler parameters were normal. Left ventricular diastolic function parameters were normal.  ------------------------------------------------------------ Aortic valve: Trileaflet. Doppler: There was no stenosis. No regurgitation.  ------------------------------------------------------------ Aorta: Aortic root: The aortic root was normal in size. Ascending aorta: The ascending aorta was normal in size.  ------------------------------------------------------------ Mitral valve: Doppler: There was no evidence for stenosis. Trivial regurgitation.  ------------------------------------------------------------ Left atrium: The atrium was mildly dilated.  ------------------------------------------------------------ Right ventricle: The cavity size was normal. Systolic function was normal.  ------------------------------------------------------------ Pulmonic valve: Structurally normal valve. Cusp separation was normal. Doppler: Transvalvular velocity was within the normal range. No regurgitation.  ------------------------------------------------------------ Tricuspid valve: Doppler: No significant regurgitation.  ------------------------------------------------------------ Pulmonary artery: No complete TR doppler jet so unable to estimate PA systolic pressure.  ------------------------------------------------------------ Right atrium: The atrium was normal in size.  ------------------------------------------------------------ Pericardium: There was no pericardial effusion.  ------------------------------------------------------------ Systemic veins: Inferior vena cava: The vessel  was normal in size; the respirophasic diameter changes were in the normal range (= 50%); findings are consistent with normal central venous pressure.  ------------------------------------------------------------  2D measurements Normal Doppler measurements Normal Left ventricle Left ventricle LVID ED, 56.6 mm 43-52 Ea, lat ann, 11. cm/s ------ chord, PLAX tiss DP 3 LVID ES, 40.1 mm 23-38 E/Ea, lat 5.8 ------ chord, PLAX ann, tiss DP 9 FS, chord, 29 % >29 Ea, med ann, 6.9 cm/s ------ PLAX tiss DP 1 LVPW, ED 11.4 mm ------ E/Ea, med 9.6 ------ IVS/LVPW 0.96 <1.3 ann, tiss DP 4 ratio, ED Mitral valve Ventricular septum Peak E vel 66. cm/s ------ IVS, ED 10.9 mm ------ 6 Aorta Peak A vel 54. cm/s ------ Root diam, 34 mm ------ 3 ED Deceleration 197 ms 150-23 Left atrium time 0 AP dim 42 mm ------ Peak E/A 1.2 ------ ratio Right ventricle Sa vel, lat 13. cm/s ------ ann, tiss DP 3  ------------------------------------------------------------ Prepared and Electronically Authenticated by  Loralie Champagne [id number]:55:25.133  Admission HPI: [name] is a 37 y.o. man pmh poorly controlled DM and on long term anti-coagulation for DVTs and PEs presenting acutely to clinic for foot/toe pain. Pt stated that he has had acute weight gain may be within the past month has been both noticeable by himself and his girlfriend he feels is mostly localized in his legs and possibly body. His regular measured weight at home is 309 and patient is 349 today. Also per chart review, patient's last recorded weight was 349.  Patient has been self regulating warfarin for history of DVTs and PEs with no regular INR checks. And has had sudden acute worsening of unilateral edema in his right foot. Patient is also started vomiting and having nausea today but no diarrhea. This is accompanied by some subjective fevers and chills.  Patient took insulin this morning after not eating. When asked about his  CBG readings at home, patient  states measures 3 times a day and has within the past 2 days run as low as 38 with accompanied shakes and sweats. Measured blood glucose in clinic was 46. He was given glucose gel, and blood sugar increased to 114.  In regards to toe pain, his R great toenail fell off yesterday and has noted some weeping purulent drainage from the exposed nailbed.. Over the past day or two, he has noticed increased swelling of R ankle. He reports pain and tightness of skin over R foot. Is able to bear weight and move ankle with full ROM and no pain in joint. He has a chronic large diabetic callous of the R great toe which he reports had been infected previously. Patient tried to apply Vaseline to toe and compression stockings over R leg to decrease the ankle swelling, but symptoms persisted.  Denies HA, chest pain, shortness of breath, cough, PND, orthopnea, joint pain, decreased ROM of ankle joints, abdominal pain, or dizziness.  Hospital Course by problem list:   Nephrotic syndrome--presented with 40lb weight gain since July 2013 and edema in lower extremities.  Echo during this admission shows EF 55% - 60%.  He also has HTN. Elevated urine total protein--384.  24 hour urine protein done: 3560.  Started on lasix 20mg  qd at discharge with 6meq K qd that will need to be monitored as an outpatient.  Outpatient renal follow up appointment made--Dr. Lorrene Reid.     DM (diabetes mellitus) type I uncontrolled, periph vascular disorder with symptomatic hypoglycemia--on metformin 500mg  BID and Novolin 70/30 40 units BID at home. Blood sugar dropped to 46 on day of admission after he administered insulin with minimal intake. Blood sugar increased to 114 after receiving glucose gel in clinic.  Suspect drop in blood sugar related to inappropriate insulin administrating in setting of decreased po intake. Hb A1C 8.2 02/09/12.  Held long acting insulin during hospital course and resumed on discharge.  Will need  to be re-evaluated by pcp.  Placed on SSI during hospital course.  Discussed importance of diet, weight loss, and portion control as well.     Morbid obesity--weight on discharge 343lbs.  Hx of DM and HTN.  Reported 40lb weight gain since July.  Found to have proteinuria during hospital course, likely nephrotic syndrome.  Stressed importance of better diabetes control, exercise as tolerated and diet.   Long term (current) use of anticoagulants with hx of DVT and PE--He was under coagulated due to medical noncompliance on admission. B/l doppler neg for DVT.  INR 1.1 on admission.  Started on anticoagulation with lovenox and warfarin per pharmacy with bridging. INR on discharge 1.52.  Discussed with Dr. Elie Confer at time of discharge, do not need to continue bridge with lovenox at discharge in this case as per literature--article by Dr. Grayland Ormond 30 days of index event, incidental subtherapeutic level does not require bridging . Will continue coumadin and have INR follow up in clinic with Dr. Elie Confer on Thursday, 02/15/12 and onwards for future visits.     HTN (hypertension)--on lisinopril at home but hx of non-adherence.  Continued on lisinopril during hospital course and on discharge.  Follow up with pcp advised.     Right foot pain--?R foot ankle cellulitis.  Presented with acute onset swelling of right ankle and foot with no history of antecedent trauma.  However, he did have his toe nail peeled off on his right foot and was started on doxycycline for 10 day course.  MRI right foot did  not show OM or abscess.  Doppler study negative for DVT.  Likely ulceration over 4th and 5th MTP and diffuse edema of foot.  Culture of purulent fluid from R great toe obtained--few staphylococcus aureus, official pending.  F/u with pcp and foot center recommended.    Discharge Vitals:  BP 105/82  Pulse 88  Temp 98.4 F (36.9 C) (Oral)  Resp 19  Ht 6\' 1"  (1.854 m)  Wt 343 lb 12.8 oz (155.947 kg)  BMI 45.36 kg/m2   SpO2 100%  Discharge Labs:  Results for orders placed during the hospital encounter of 02/09/12 (from the past 24 hour(s))  GLUCOSE, CAPILLARY     Status: Abnormal   Collection Time   02/11/12  8:59 PM      Component Value Range   Glucose-Capillary 225 (*) 70 - 99 mg/dL   Comment 1 Documented in Chart     Comment 2 Notify RN    PROTIME-INR     Status: Abnormal   Collection Time   02/12/12  5:55 AM      Component Value Range   Prothrombin Time 17.9 (*) 11.6 - 15.2 seconds   INR 1.52 (*) 0.00 - 1.49  CBC     Status: Normal   Collection Time   02/12/12  5:55 AM      Component Value Range   WBC 6.4  4.0 - 10.5 K/uL   RBC 4.98  4.22 - 5.81 MIL/uL   Hemoglobin 14.4  13.0 - 17.0 g/dL   HCT 42.0  39.0 - 52.0 %   MCV 84.3  78.0 - 100.0 fL   MCH 28.9  26.0 - 34.0 pg   MCHC 34.3  30.0 - 36.0 g/dL   RDW 12.7  11.5 - 15.5 %   Platelets 286  150 - 400 K/uL  BASIC METABOLIC PANEL     Status: Abnormal   Collection Time   02/12/12  5:55 AM      Component Value Range   Sodium 137  135 - 145 mEq/L   Potassium 4.1  3.5 - 5.1 mEq/L   Chloride 103  96 - 112 mEq/L   CO2 26  19 - 32 mEq/L   Glucose, Bld 187 (*) 70 - 99 mg/dL   BUN 11  6 - 23 mg/dL   Creatinine, Ser 0.84  0.50 - 1.35 mg/dL   Calcium 9.0  8.4 - 10.5 mg/dL   GFR calc non Af Amer >90  >90 mL/min   GFR calc Af Amer >90  >90 mL/min  GLUCOSE, CAPILLARY     Status: Abnormal   Collection Time   02/12/12  6:27 AM      Component Value Range   Glucose-Capillary 178 (*) 70 - 99 mg/dL   Comment 1 Documented in Chart     Comment 2 Notify RN    GLUCOSE, CAPILLARY     Status: Abnormal   Collection Time   02/12/12 11:09 AM      Component Value Range   Glucose-Capillary 244 (*) 70 - 99 mg/dL   Comment 1 Documented in Chart     Comment 2 Notify RN    GLUCOSE, CAPILLARY     Status: Abnormal   Collection Time   02/12/12  4:09 PM      Component Value Range   Glucose-Capillary 227 (*) 70 - 99 mg/dL   Comment 1 Documented in Chart     Comment 2  Notify RN     Signed: Jerene Pitch 02/12/2012,  6:26 PM   Time Spent on Discharge: 35 minutes Services Ordered on Discharge: renal follow up Equipment Ordered on Discharge:

## 2012-02-12 NOTE — Progress Notes (Addendum)
Subjective: [name] was seen and examined at bedside.  No acute events overnight. Denies any fever, chills, nausea, vomiting, abdominal pain, chest pain or shortness breath at this time.   Objective: Vital signs in last 24 hours: Filed Vitals:   02/11/12 1400 02/11/12 2020 02/12/12 0435 02/12/12 1051  BP: 132/68 144/82 137/75 136/79  Pulse: 88 73 72   Temp: 97.6 F (36.4 C) 98.4 F (36.9 C) 98.4 F (36.9 C)   TempSrc: Oral Oral Oral   Resp: 18 21 20    Height:      Weight:   343 lb 12.8 oz (155.947 kg)   SpO2: 100% 100% 100%    Weight change: -14.4 oz (-0.408 kg) No intake or output data in the 24 hours ending 02/12/12 0744 Vitals reviewed.  General: resting in bed, NAD  HEENT: PERRLA, EOMI, no scleral icterus  Cardiac: RRR, no rubs, murmurs or gallops  Pulm: clear to auscultation bilaterally, no wheezes, rales, or rhonchi  Abd: soft, obese, nontender, nondistended, BS present  Ext: warm and well perfused, non-tender. Swelling and erythema of R foot and ankle improved. R great toenail is absent with dried up. Large callous of plantar skin under R MTP joing. L foot with callous on lateral arch, no fluid draining.  Neuro: alert and oriented X3, cranial nerves II-XII grossly intact, strength and sensation to light touch equal in bilateral upper and lower extremities  Lab Results: Basic Metabolic Panel:  Lab [id number] 0555 02/10/12 0630  NA 137 139  K 4.1 3.7  CL 103 106  CO2 26 25  GLUCOSE 187* 89  BUN 11 13  CREATININE 0.84 0.85  CALCIUM 9.0 9.0  MG -- --  PHOS -- --   Liver Function Tests:  Lab 02/09/12 1455  AST 18  ALT 20  ALKPHOS 84  BILITOT 0.3  PROT 7.6  ALBUMIN 3.1*   CBC:  Lab 02/12/12 0555 02/10/12 0630 02/09/12 1455  WBC 6.4 7.8 --  NEUTROABS -- -- 3.3  HGB 14.4 13.9 --  HCT 42.0 40.4 --  MCV 84.3 85.2 --  PLT 286 293 --   Cardiac Enzymes:  Lab 02/10/12 0630 02/09/12 2149 02/09/12 1659  CKTOTAL -- -- --  CKMB -- -- --  CKMBINDEX -- -- --   TROPONINI <0.30 <0.30 <0.30   BNP:  Lab 02/09/12 1659  PROBNP 87.6   CBG:  Lab 02/12/12 0627 02/11/12 2059 02/11/12 1558 02/11/12 1114 02/11/12 0622 02/10/12 2046  GLUCAP 178* 225* 261* 200* 164* 238*   Hemoglobin A1C:  Lab 02/09/12 1353  HGBA1C 8.2   Thyroid Function Tests:  Lab 02/09/12 1455  TSH 1.574  T4TOTAL --  FREET4 --  T3FREE --  THYROIDAB --   Coagulation:  Lab 02/12/12 0555 02/11/12 0635 02/09/12 1455  LABPROT 17.9* 17.3* 13.8  INR 1.52* 1.46 1.1   Urine Drug Screen: Drugs of Abuse     Component Value Date/Time   LABOPIA POSITIVE* 07/12/2011 0038   COCAINSCRNUR NONE DETECTED 07/12/2011 0038   LABBENZ NONE DETECTED 07/12/2011 0038   AMPHETMU NONE DETECTED 07/12/2011 0038   THCU NONE DETECTED 07/12/2011 0038   LABBARB NONE DETECTED 07/12/2011 0038    Urinalysis:  Lab 02/09/12 1517  COLORURINE YELLOW  LABSPEC 1.029  PHURINE 5.5  GLUCOSEU NEG  HGBUR SMALL*  BILIRUBINUR NEG  KETONESUR NEG  PROTEINUR 300*  UROBILINOGEN 1  NITRITE NEG  LEUKOCYTESUR NEG   Micro Results: Recent Results (from the past 240 hour(s))  WOUND CULTURE  Status: Normal (Preliminary result)   Collection Time   02/09/12  8:21 PM      Component Value Range Status Comment   Specimen Description WOUND RIGHT TOE   Final    Special Requests NONE   Final    Gram Stain     Final    Value: NO WBC SEEN     FEW SQUAMOUS EPITHELIAL CELLS PRESENT     FEW GRAM POSITIVE RODS     FEW GRAM POSITIVE COCCI IN PAIRS     RARE GRAM NEGATIVE RODS   Culture     Final    Value: FEW STAPHYLOCOCCUS AUREUS     Note: RIFAMPIN AND GENTAMICIN SHOULD NOT BE USED AS SINGLE DRUGS FOR TREATMENT OF STAPH INFECTIONS.   Report Status PENDING   Incomplete    Studies/Results: Mr Foot Right W Wo Contrast  02/11/2012  *RADIOLOGY REPORT*  Clinical Data: Osteomyelitis.  Abscess. Plantar foot ulcer. Diabetic patient.  MRI OF THE RIGHT FOREFOOT WITHOUT AND WITH CONTRAST  Technique:  Multiplanar, multisequence MR  imaging was performed both before and after administration of intravenous contrast.  Contrast: 22mL MULTIHANCE GADOBENATE DIMEGLUMINE 529 MG/ML IV SOLN  Comparison: Radiographs 02/09/2012.  Findings: There is attenuation of the plantar soft tissues overlying the third and fourth MTP joints.  Infiltration of subcutaneous tissues is present overlying the plantar foot wound. There is no osteomyelitis.  No abscess.  Diabetic myositis is present.  Reactive fluid is present in the flexor Hallucis longus tendon sheath around the arch of the foot.  Diffuse edema of the foot is present, suggesting cellulitis in the appropriate clinical setting.  Mild midfoot degenerative changes are present.  IMPRESSION: No osteomyelitis or abscess. Thinning of the plantar soft tissues over the fourth and fifth MTP joints likely represents ulceration. Diffuse edema of the foot which may represent dependent edema, cellulitis in the appropriate clinical setting, the abnormal signal in the soft tissues is likely a combination of all three entities.   Original Report Authenticated By: Dereck Ligas, M.D.    Medications: I have reviewed the patient's current medications. Scheduled Meds:    . doxycycline  100 mg Oral Q12H  . enoxaparin (LOVENOX) injection  160 mg Subcutaneous Q12H  . influenza  inactive virus vaccine  0.5 mL Intramuscular Tomorrow-1000  . insulin aspart  0-15 Units Subcutaneous TID WC  . lisinopril  10 mg Oral Daily  . sodium chloride  3 mL Intravenous Q12H  . sodium chloride  3 mL Intravenous Q12H  . Warfarin - Pharmacist Dosing Inpatient   Does not apply q1800   Continuous Infusions:  PRN Meds:.sodium chloride, acetaminophen, acetaminophen, ondansetron (ZOFRAN) IV, ondansetron, sodium chloride Assessment/Plan: [name] is a 37 y.o. man with PMH poorly controlled DM and on long term anti-coagulation for DVTs and PEs presenting acutely to clinic with complaints of weight gain and R foot pain and  swelling.   1) R foot/ankle cellulitis -acute onset swelling of R ankle and foot with no history of antecedent trauma.  Doppler study negative for DVT B/L MRI of the right foot neg for OM or abscess.  Likely ulceration over 4th and 5th MTP and diffuse edema of foot.  - Culture of purulent fluid from R great toe--few Staph Aureus - Doxycycline 100 twice a day started 2/2: day 2 of 10 day course  2) Chronic anticoagulation therapy for repeated DVT and PE. Patient is under coagulated due to medical noncompliance.  B/l doppler neg for DVT.  -  Anticoagulation with lovenox and warfarin. Discussed with Dr. Elie Confer, do not need to continue bridge with lovenox at discharge in this case as per literature--article by Dr. Grayland Ormond 30 days of index event, incidental subtherapeutic level does not require bridging .  Will continue coumadin and have INR follow up in clinic with Dr. Elie Confer on Thursday, 02/15/12.   - INR 1.52 today - anticoagulation per pharmacy  3) Nephrotic syndrome  - 3560 protein/24 hour urine - lasix 20mg  po qd with 77meq k qd, f/u in opc with pcp - Spoke to Dr. Lorrene Reid from renal--outpatient follow up, starting lasix daily with monitoring in opc with pcp.    4) Insulin dependent DM with symptomatic hypoglycemia--on metformin 500mg  BID and Novolin 70/30 40 units BID at home. Blood sugar dropped to 46 on day of admission after patient administered insulin with minimal intake. Blood sugar increased to 114 after receiving glucose gel in clinic. Suspect drop in blood sugar related to inappropriate insulin administrating in setting of decreased po intake. Hb A1C 8.2 02/09/12  - restart home medications at d/c and re-evaluate with pcp.  - SSI moderate  4)HTN--on lisinopril. BP 165/91 on admission, pt in acute pain,  - Continue lisinopril   Dispo: likely d/c to home today.  The patient does have a current PCP (PRIBULA,CHRISTOPHER, MD), therefore will be requiring OPC follow-up after  discharge.  The patient does not have transportation limitations that hinder transportation to clinic appointments  Services Needed at time of discharge: Y = Yes, Blank = No PT:   OT:   RN:   Equipment:   Other: Renal follow up, opc follow up, f/u INR check Thursday, 02/15/12    LOS: 3 days   Jerene Pitch 02/12/2012, 7:44 AM

## 2012-02-13 LAB — WOUND CULTURE: Gram Stain: NONE SEEN

## 2012-02-13 NOTE — Discharge Summary (Signed)
Internal Medicine Teaching Service Attending Note Date: 02/13/2012  Patient name: [name]  Medical record number: [id number]  Date of birth: 03/12/1975    This patient has been seen and discussed with the house staff. Please see their note for complete details. I concur with their findings with the following additions/corrections:  Patient improved he is going home on doxycycline first paronychia. He'll need close followup for management of his nephrotic syndrome and multiple co morbid conditions.  Rhina Brackett Dam 02/13/2012, 2:12 PM

## 2012-02-15 ENCOUNTER — Ambulatory Visit (INDEPENDENT_AMBULATORY_CARE_PROVIDER_SITE_OTHER): Payer: BC Managed Care – PPO | Admitting: Pharmacist

## 2012-02-15 DIAGNOSIS — Z7901 Long term (current) use of anticoagulants: Secondary | ICD-10-CM

## 2012-02-15 DIAGNOSIS — I824Y9 Acute embolism and thrombosis of unspecified deep veins of unspecified proximal lower extremity: Secondary | ICD-10-CM

## 2012-02-15 DIAGNOSIS — Z86718 Personal history of other venous thrombosis and embolism: Secondary | ICD-10-CM

## 2012-02-15 LAB — POCT INR: INR: 1.7

## 2012-02-15 NOTE — Progress Notes (Signed)
Anti-Coagulation Progress Note  [name] is a 37 y.o. male who is currently on an anti-coagulation regimen.    RECENT RESULTS: Recent results are below, the most recent result is correlated with a dose of 70 mg. per week: He will be taking 55mg  over 4 days (13.75mg /day average). Lab Results  Component Value Date   INR 1.7 02/15/2012   INR 1.52* 02/12/2012   INR 1.46 02/11/2012    ANTI-COAG DOSE:   Latest dosing instructions   Total Sun Mon Tue Wed Thu Fri Sat   55 12.5 mg    15 mg 15 mg 12.5 mg    (5 mg2.5)    (5 mg3) (5 mg3) (5 mg2.5)         ANTICOAG SUMMARY: Anticoagulation Episode Summary              Current INR goal 2.0-3.0 Next INR check 02/19/2012   INR from last check 1.7! (02/15/2012)     Weekly max dose (mg)  Target end date    Indications DVT, HX OF [V12.51], Acute venous embolism and thrombosis of deep vessels of proximal lower extremity [453.41], Long term (current) use of anticoagulants [V58.61]   INR check location [facility] Preferred lab    Send INR reminders to    Comments             ANTICOAG TODAY: Anticoagulation Summary as of 02/15/2012              INR goal 2.0-3.0     Selected INR 1.7! (02/15/2012) Next INR check 02/19/2012   Weekly max dose (mg)  Target end date    Indications DVT, HX OF [V12.51], Acute venous embolism and thrombosis of deep vessels of proximal lower extremity [453.41], Long term (current) use of anticoagulants [V58.61]    Anticoagulation Episode Summary              IN[facility] check location [facility] Preferred lab    Send INR reminders to    Comments             PATIENT INSTRUCTIONS: Patient Instructions  Patient instructed to take medications as defined in the Anti-coagulation Track section of this encounter.  Patient instructed to take today's dose.  Patient verbalized understanding of these instructions.        FOLLOW-UP Return in 4 days (on 02/19/2012) for Follow up INR at 1:45PM.  Jorene Guest,  III Pharm.D., CACP

## 2012-02-15 NOTE — Patient Instructions (Signed)
Patient instructed to take medications as defined in the Anti-coagulation Track section of this encounter.  Patient instructed to take today's dose.  Patient verbalized understanding of these instructions.    

## 2012-02-19 ENCOUNTER — Encounter: Payer: Self-pay | Admitting: Internal Medicine

## 2012-02-19 ENCOUNTER — Ambulatory Visit (INDEPENDENT_AMBULATORY_CARE_PROVIDER_SITE_OTHER): Payer: BC Managed Care – PPO | Admitting: Internal Medicine

## 2012-02-19 VITALS — BP 145/83 | HR 60 | Temp 96.7°F | Ht 73.0 in | Wt 352.0 lb

## 2012-02-19 DIAGNOSIS — L97519 Non-pressure chronic ulcer of other part of right foot with unspecified severity: Secondary | ICD-10-CM

## 2012-02-19 DIAGNOSIS — I824Y9 Acute embolism and thrombosis of unspecified deep veins of unspecified proximal lower extremity: Secondary | ICD-10-CM

## 2012-02-19 DIAGNOSIS — I798 Other disorders of arteries, arterioles and capillaries in diseases classified elsewhere: Secondary | ICD-10-CM

## 2012-02-19 DIAGNOSIS — L97509 Non-pressure chronic ulcer of other part of unspecified foot with unspecified severity: Secondary | ICD-10-CM

## 2012-02-19 DIAGNOSIS — I10 Essential (primary) hypertension: Secondary | ICD-10-CM

## 2012-02-19 DIAGNOSIS — IMO0002 Reserved for concepts with insufficient information to code with codable children: Secondary | ICD-10-CM

## 2012-02-19 DIAGNOSIS — Z86718 Personal history of other venous thrombosis and embolism: Secondary | ICD-10-CM

## 2012-02-19 DIAGNOSIS — Z7901 Long term (current) use of anticoagulants: Secondary | ICD-10-CM

## 2012-02-19 DIAGNOSIS — E1051 Type 1 diabetes mellitus with diabetic peripheral angiopathy without gangrene: Secondary | ICD-10-CM

## 2012-02-19 DIAGNOSIS — E1059 Type 1 diabetes mellitus with other circulatory complications: Secondary | ICD-10-CM

## 2012-02-19 DIAGNOSIS — N049 Nephrotic syndrome with unspecified morphologic changes: Secondary | ICD-10-CM

## 2012-02-19 MED ORDER — FUROSEMIDE 40 MG PO TABS: 60.0000 mg | ORAL_TABLET | Freq: Every day | ORAL | Status: DC

## 2012-02-19 MED ORDER — WARFARIN SODIUM 5 MG PO TABS: ORAL_TABLET | ORAL | Status: DC

## 2012-02-19 MED ORDER — INSULIN NPH ISOPHANE & REGULAR (70-30) 100 UNIT/ML ~~LOC~~ SUSP: SUBCUTANEOUS | Status: DC

## 2012-02-19 NOTE — Progress Notes (Signed)
Agree 

## 2012-02-19 NOTE — Progress Notes (Signed)
Subjective:   Patient ID: [name] male   DOB: [birth year] 37 y.o.   MRN: [id number]  HPI:- 37 year-old man with past medical history significant for DVT,  Diabetes mellitus, newly diagnosed nephrotic syndrome comes to the clinic for a hospital followup.  Patient was recently hospitalized through 02/09/2012 to 02/12/2012 for weight gain and right foot/ankle cellulitis with right great toe nail injury. New diagnosis of nephrotic syndrome was established based on 3560 protein/24 hour urine, history of DVT and weight gain of 40 lbs over last 6 months.Patient continues to gain weight, 10 pounds up from his discharge weight.Denies any symptoms of shortness of breath, dyspnea on exertion, abdominal  Pain, nausea or vomiting.  His right great toe nail injury is healing well as per the patient. No pain , swelling  and redness decreased.   DM: Patient reports that his CBG dropped to 46 this morning when he took his insulin shot at 7:30 and left for work but did not have anything to eat until 9 AM. States that he has been taking 40 units of insulin( 70/30)  twice daily.   Past Medical History  Diagnosis Date  . Diabetes mellitus   . DVT (deep venous thrombosis) 09/2002    patient reports additional DVTs in '06 & '11 (unconfirmed)  . History of hypertension     resolved after weight loss  . Pulmonary embolism 09/2002    treated with 6 months of warfarin   Family History  Problem Relation Age of Onset  . Heart disease    . Diabetes    . Obesity     History   Social History  . Marital Status: Single    Spouse Name: N/A    Number of Children: N/A  . Years of Education: N/A   Occupational History  . [employer]   . student    Social History Main Topics  . Smoking status: Never Smoker   . Smokeless tobacco: Not on file  . Alcohol Use: Yes     Comment: occasional, q3w 2 beers  . Drug Use: No  . Sexually Active: Not on file   Other Topics Concern  . Not on file   Social History  Narrative   Financial assistance approved for 100% discount at [facility] and has [facility] card per [employer]   09/01/2009.      Lives in [place] with mother and sister.               Review of Systems: General: Denies fever, chills, diaphoresis, appetite change and fatigue. HEENT: Denies photophobia, eye pain, redness, hearing loss, ear pain, congestion, sore throat, rhinorrhea, sneezing, mouth sores, trouble swallowing, neck pain, neck stiffness and tinnitus. Respiratory: Denies SOB, DOE, cough, chest tightness, and wheezing. Cardiovascular: Denies to chest pain, palpitations and leg swelling. Gastrointestinal: Denies nausea, vomiting, abdominal pain, diarrhea, constipation, blood in stool and abdominal distention. Genitourinary: Denies dysuria, urgency, frequency, hematuria, flank pain and difficulty urinating. Musculoskeletal: Denies myalgias, back pain, joint swelling, arthralgias and gait problem.  Skin: Denies pallor, rash and wound. Neurological: Denies dizziness, seizures, syncope, weakness, light-headedness, numbness and headaches. Hematological: Denies adenopathy, easy bruising, personal or family bleeding history. Psychiatric/Behavioral: Denies suicidal ideation, mood changes, confusion, nervousness, sleep disturbance and agitation.    Current Outpatient Medications: Current Outpatient Prescriptions  Medication Sig Dispense Refill  . doxycycline (VIBRA-TABS) 100 MG tablet Take 1 tablet (100 mg total) by mouth every 12 (twelve) hours.  17 tablet  0  . furosemide (LASIX) 40 MG  tablet Take 1.5 tablets (60 mg total) by mouth daily.  30 tablet  0  . insulin NPH-insulin regular (NOVOLIN 70/30) (70-30) 100 UNIT/ML injection Inject 40 units in AM and 35 units in PM  10 mL  0  . lisinopril (PRINIVIL,ZESTRIL) 10 MG tablet Take 1 tablet (10 mg total) by mouth daily.  30 tablet  1  . metFORMIN (GLUCOPHAGE) 500 MG tablet Take 2 tablets (1,000 mg total) by mouth 2 (two) times daily with a  meal.  120 tablet  6  . potassium chloride (K-DUR,KLOR-CON) 10 MEQ tablet Take 1 tablet (10 mEq total) by mouth daily.  30 tablet  0  . warfarin (COUMADIN) 5 MG tablet Take as directed by anticoagulation clinic provider. Currently taking 2 and 1/2 x 10mg  strength tablets.  75 tablet  2   No current facility-administered medications for this visit.    Allergies: No Known Allergies    Objective:   Physical Exam: Filed Vitals:   02/19/12 1421  BP: 145/83  Pulse: 60  Temp: 96.7 F (35.9 C)    General: Vital signs reviewed and noted. Well-developed, well-nourished, in no acute distress; alert, appropriate and cooperative throughout examination, morbidly obese Head: Normocephalic, atraumatic Lungs: Normal respiratory effort. Clear to auscultation BL without crackles or wheezes. Heart: RRR. S1 and S2 normal without gallop, murmur, or rubs. Abdomen:BS normoactive. Soft, Nondistended, non-tender.  No masses or organomegaly. Extremities: 2+ pitting edema( right > left) Right great toe nail wound appears to be healing well, hyperkeratotic skin, no discharge or drainage noted.      Assessment & Plan:

## 2012-02-19 NOTE — Patient Instructions (Addendum)
General Instructions: Please schedule a follow up appointment in 3- 4 weeks . Please bring your medication bottles with your next appointment. Please take your medicines as prescribed. I will call you with your lab results if anything will be abnormal. Please increase your lasix to 60 mg a day. Please schedule a follow up appointment  With diabetes educator.  Please keep up with your nephrology appt.  Please keep a snack with you.  Please decrease the night time insulin to 35 units. Please call the clinic if you have low CBG's    Treatment Goals:  Goals (1 Years of Data) as of 02/19/12         As of Today 02/12/12 02/12/12 02/12/12 02/11/12     Blood Pressure    . Blood Pressure < 140/90  145/83 105/82 136/79 137/75 144/82     Result Component    . HEMOGLOBIN A1C < 7.0          . LDL CALC < 100            Progress Toward Treatment Goals:  Treatment Goal 02/19/2012  Blood pressure deteriorated    Self Care Goals & Plans:  Self Care Goal 02/19/2012  Manage my medications take my medicines as prescribed; bring my medications to every visit; refill my medications on time; follow the sick day instructions if I am sick  Monitor my health keep track of my blood pressure; bring my glucose meter and log to each visit; keep track of my blood glucose  Eat healthy foods -    Home Blood Glucose Monitoring 02/19/2012  Check my blood sugar 3 times a day  When to check my blood sugar before meals     Care Management & Community Referrals:  Referral 02/19/2012  Referrals made for care management support diabetes educator

## 2012-02-19 NOTE — Progress Notes (Signed)
Anti-Coagulation Progress Note  [name] is a 37 y.o. male who is currently on an anti-coagulation regimen.    RECENT RESULTS: Recent results are below, the most recent result is correlated with a dose of 55mg  over the past 4 days. Lab Results  Component Value Date   INR 3.2 02/19/2012   INR 1.7 02/15/2012   INR 1.52* 02/12/2012    ANTI-COAG DOSE: Anticoagulation Dose Instructions as of 02/19/2012     [name] Tue Wed Thu Fri Sat   New Dose 12.5 mg 12.5 mg 12.5 mg 12.5 mg 12.5 mg 12.5 mg 12.5 mg       ANTICOAG SUMMARY: Anticoagulation Episode Summary   Current INR goal 2.0-3.0  Next INR check 02/26/2012  INR from last check 3.2! (02/19/2012)  Weekly max dose   Target end date   [facility] location [facility]  Preferred lab   Send INR reminders to    Indications  DVT HX OF [V12.51] Acute venous embolism and thrombosis of deep vessels of proximal lower extremity [453.41] Long term (current) use of anticoagulants [V58.61]        Comments         ANTICOAG TODAY: Anticoagulation Summary as of 02/19/2012   INR goal 2.0-3.0  Selected INR 3.2! (02/19/2012)  Next INR check 02/26/2012  Target end date    Indications  DVT HX OF [V12.51] Acute venous embolism and thrombosis of deep vessels of proximal lower extremity [453.41] Long term (current) use of anticoagulants [V58.61]      Anticoagulation Episode Summary   [facility]   Preferred lab    Send INR reminders to    Comments       PATIENT INSTRUCTIONS: Patient Instructions  Patient instructed to take medications as defined in the Anti-coagulation Track section of this encounter.  Patient instructed to take today's dose.  Patient verbalized understanding of these instructions.       FOLLOW-UP Return in 7 days (on 02/26/2012) for Follow up INR at [place], III Pharm.D., CACP

## 2012-02-19 NOTE — Addendum Note (Signed)
Addended by: Jorene Guest B on: 02/19/2012 02:18 PM   Modules accepted: Orders, Level of Service

## 2012-02-19 NOTE — Patient Instructions (Signed)
Patient instructed to take medications as defined in the Anti-coagulation Track section of this encounter.  Patient instructed to take today's dose.  Patient verbalized understanding of these instructions.    

## 2012-02-20 LAB — BASIC METABOLIC PANEL WITH GFR
Chloride: 102 mEq/L (ref 96–112)
GFR, Est Non African American: >89 mL/min
Potassium: 4 mEq/L (ref 3.5–5.3)
Sodium: 138 mEq/L (ref 135–145)

## 2012-02-21 LAB — LIPID PANEL
Cholesterol: 157 mg/dL (ref 0–200)
HDL: 52 mg/dL (ref >39)
Total CHOL/HDL Ratio: 3 Ratio
Triglycerides: 153 mg/dL — ABNORMAL HIGH (ref <150)

## 2012-02-21 NOTE — Assessment & Plan Note (Signed)
Lab Results  Component Value Date   HGBA1C 8.2 02/09/2012   HGBA1C 11.8 07/11/2011   HGBA1C 14.0 12/29/2010     Assessment:  Diabetes control: fair control  Progress toward A1C goal:     Comments: His A1c has improved from before. He did report that his CBG dropped to 46 this morning when he took his insulin at 7:30 but did not eat anything until 9 AM. Patient was educated that this is not the right approach and explained that hypoglycemia could be dangerous. Patient was advised always  eat something when he takes his insulin. He was also advised to keep a snack with him. Would decrease his evening dose of 70/30 from 40-35 units for now. Would schedule an appointment with our diabetes educator and counselor  Plan:  Medications:  Decrease the evening dose of insulin from 40 units to 35 units.  Home glucose monitoring:   Frequency: 3 times a day   Timing: before meals  Instruction/counseling given: reminded to get eye exam, reminded to bring blood glucose meter & log to each visit, reminded to bring medications to each visit, discussed foot care, discussed the need for weight loss and discussed diet  Educational resources provided:    Self management tools provided:    Other plans: Would schedule an appointment with our diabetes counselor and educator for next week. Foot exam was done. Lipid panel was checked.

## 2012-02-21 NOTE — Assessment & Plan Note (Signed)
Hospital followup for nephrotic syndrome. Patient continues to gain weight and his weight was ~10 pounds up from his discharge weight. He claims to be taking his Lasix  20 mg daily. Continues to deny any symptoms of shortness of breath or DOE.  -Increase his Lasix from 20 to 60 mg a day. -Patient has outpatient followup appointment with Dr. Lorrene Reid on 02/22/12. He was encouraged not to miss his appointment. -Check BMET. Would also check lipid panel( to look for HLD in a patient with nephrotic syndrome). -We may consider going up on his lisinopril if needed for better blood pressure control in future visits.   Update: His K was WNL. His Lipid panel was also WNL

## 2012-02-21 NOTE — Assessment & Plan Note (Signed)
He was seen by Dr. Elie Confer today for coumadin dose adjustments. INR therapeutic.

## 2012-02-21 NOTE — Assessment & Plan Note (Signed)
Blood pressure mildly elevated. Continue to monitor- would adjust the dose of lisinopril if needed at future visits.

## 2012-02-21 NOTE — Assessment & Plan Note (Signed)
His right great toe nail injury appears to be healing well. No discharge or drainage was noticed. Patient was encouraged to finish up the course of his antibiotics. - No indication for wound center referral. - Return to clinic in 2-3 weeks to get the right great toe evaluated again. If the wound  would still persists, will refer him to wound care Center

## 2012-02-22 ENCOUNTER — Encounter: Payer: Self-pay | Admitting: Dietician

## 2012-02-26 ENCOUNTER — Ambulatory Visit (INDEPENDENT_AMBULATORY_CARE_PROVIDER_SITE_OTHER): Payer: BC Managed Care – PPO | Admitting: Pharmacist

## 2012-02-26 DIAGNOSIS — Z7901 Long term (current) use of anticoagulants: Secondary | ICD-10-CM

## 2012-02-26 DIAGNOSIS — I824Y9 Acute embolism and thrombosis of unspecified deep veins of unspecified proximal lower extremity: Secondary | ICD-10-CM

## 2012-02-26 DIAGNOSIS — Z86718 Personal history of other venous thrombosis and embolism: Secondary | ICD-10-CM

## 2012-02-26 NOTE — Progress Notes (Signed)
Anti-Coagulation Progress Note  [name] is a 37 y.o. male who is currently on an anti-coagulation regimen.    RECENT RESULTS: Recent results are below, the most recent result is correlated with a dose of 87.5 mg. per week: Lab Results  Component Value Date   INR 4.30 02/26/2012   INR 3.2 02/19/2012   INR 1.7 02/15/2012    ANTI-COAG DOSE: Anticoagulation Dose Instructions as of 02/26/2012     [name] Tue Wed Thu Fri Sat   New Dose 10 mg 12.5 mg 10 mg 12.5 mg 10 mg 12.5 mg 10 mg       ANTICOAG SUMMARY: Anticoagulation Episode Summary   Current INR goal 2.0-3.0  Next INR check 03/04/2012  INR from last check 4.30! (02/26/2012)  Weekly max dose   Target end date   [facility]  Preferred lab   Send INR reminders to    Indications  DVT HX OF [V12.51] Acute venous embolism and thrombosis of deep vessels of proximal lower extremity [453.41] Long term (current) use of anticoagulants [V58.61]        Comments         ANTICOAG TODAY: Anticoagulation Summary as of 02/26/2012   INR goal 2.0-3.0  Selected INR 4.30! (02/26/2012)  Next INR check 03/04/2012  Target end date    Indications  DVT HX OF [V12.51] Acute venous embolism and thrombosis of deep vessels of proximal lower extremity [453.41] Long term (current) use of anticoagulants [V58.61]      Anticoagulation Episode Summary   [facility] check location [facility]   Preferred lab    Send INR reminders to    Comments       PATIENT INSTRUCTIONS: Patient Instructions  Patient instructed to take medications as defined in the Anti-coagulation Track section of this encounter.  Patient instructed to OMIT/HOLD today's dose.  Patient verbalized understanding of these instructions.       FOLLOW-UP Return in 7 days (on 03/04/2012) for Follow up INR at [place], III Pharm.D., CACP

## 2012-02-26 NOTE — Patient Instructions (Signed)
Patient instructed to take medications as defined in the Anti-coagulation Track section of this encounter.  Patient instructed to OMIT/HOLD today's dose.  Patient verbalized understanding of these instructions.    

## 2012-03-01 ENCOUNTER — Ambulatory Visit: Payer: Self-pay | Admitting: Internal Medicine

## 2012-03-11 ENCOUNTER — Ambulatory Visit: Payer: Self-pay | Admitting: Internal Medicine

## 2012-03-11 ENCOUNTER — Encounter: Payer: Self-pay | Admitting: Dietician

## 2012-03-18 ENCOUNTER — Ambulatory Visit (INDEPENDENT_AMBULATORY_CARE_PROVIDER_SITE_OTHER): Payer: BC Managed Care – PPO | Admitting: Pharmacist

## 2012-03-18 DIAGNOSIS — Z86718 Personal history of other venous thrombosis and embolism: Secondary | ICD-10-CM

## 2012-03-18 DIAGNOSIS — Z7901 Long term (current) use of anticoagulants: Secondary | ICD-10-CM

## 2012-03-18 DIAGNOSIS — I824Y9 Acute embolism and thrombosis of unspecified deep veins of unspecified proximal lower extremity: Secondary | ICD-10-CM

## 2012-03-18 MED ORDER — WARFARIN SODIUM 5 MG PO TABS: ORAL_TABLET | ORAL | Status: DC

## 2012-03-18 NOTE — Progress Notes (Signed)
Anti-Coagulation Progress Note  [name] is a 37 y.o. male who is currently on an anti-coagulation regimen.    RECENT RESULTS: Recent results are below, the most recent result is correlated with a dose of 77.5 mg. per week: Lab Results  Component Value Date   INR 1.00 03/18/2012   INR 4.30 02/26/2012   INR 3.2 02/19/2012    ANTI-COAG DOSE: Anticoagulation Dose Instructions as of 03/18/2012     Sun Mon Tue Wed Thu Fri Sat   New Dose 10 mg 12.5 mg 12.5 mg 12.5 mg 12.5 mg 12.5 mg 12.5 mg       ANTICOAG SUMMARY: Anticoagulation Episode Summary   Current INR goal 2.0-3.0  Next INR check 04/01/2012  INR from last check 1.00! (03/18/2012)  Weekly max dose   Target end date   [facility] check [facility]  Preferred lab   Send INR reminders to    Indications  DVT HX OF [V12.51] Acute venous embolism and thrombosis of deep vessels of proximal lower extremity [453.41] Long term (current) use of anticoagulants [V58.61]        Comments         ANTICOAG TODAY: Anticoagulation Summary as of 03/18/2012   INR goal 2.0-3.0  Selected INR 1.00! (03/18/2012)  Next INR check 04/01/2012  Target end date    Indications  DVT HX OF [V12.51] Acute venous embolism and thrombosis of deep vessels of proximal lower extremity [453.41] Long term (current) use of anticoagulants [V58.61]      Anticoagulation Episode Summary   [facility] check [facility]   Preferred lab    Send INR reminders to    Comments       PATIENT INSTRUCTIONS: Patient Instructions  Patient instructed to take medications as defined in the Anti-coagulation Track section of this encounter.  Patient instructed to take today's dose.  Patient verbalized understanding of these instructions.       FOLLOW-UP Return in 2 weeks (on 04/01/2012) for Follow up INR at 3:30PM.  Jorene Guest, III Pharm.D., CACP

## 2012-03-18 NOTE — Patient Instructions (Signed)
Patient instructed to take medications as defined in the Anti-coagulation Track section of this encounter.  Patient instructed to take today's dose.  Patient verbalized understanding of these instructions.    

## 2012-03-19 NOTE — Progress Notes (Signed)
Indication: Recurrent DVTs, lifelong therapy.  Dr. Gladstone Pih assessment and plan were reviewed and I agree with the plan as documented in his note.

## 2012-04-01 ENCOUNTER — Ambulatory Visit: Payer: BC Managed Care – PPO | Admitting: Internal Medicine

## 2012-04-01 ENCOUNTER — Telehealth: Payer: Self-pay | Admitting: Pharmacist

## 2012-04-01 MED ORDER — WARFARIN SODIUM 5 MG PO TABS: ORAL_TABLET | ORAL | Status: DC

## 2012-04-01 NOTE — Telephone Encounter (Signed)
Patient came to [facility] today--was not on my schedule/template:  Am entering his data via this method. Will discuss with front office staff tomorrow upon their return. Patient's INR = 1.8 on 85mg /wk warfarin using 5mg  strength tablets. Dose is being increased to 92.5mg /wk using 5mg  tablets warfarin (3x5 tablets on Mondays/Thursdays; 2 and 1/2 x 5mg  warfarin tablets all other days--next INR is on 7-APR-14 at 3:30PM. Patient denies all signs symptoms of bleeding. Patient did arrive to [facility] today wearing a fiberglass lower extremity splint/boot o RIGHT lower extremity provided by his podiatrist the patient states. States he was told he had a torn tendon and would require surgery by the podiatrist. I asked what his presenting symptom was. He denies any trauma or injury to area and states it "came on slowly". Patient has a documented history of noncompliance/poor INR response. Given this, I was concerned that he could possibly have DVT. He states that the podiatrist performed an MRI if his ankle area but not as high as his calf. I discussed with Dr. Murlean Caller. Given the history of VTE and his noncompliance to keeping appointments as well as compliance to his warfarin regiment [based upon his INR response(s)] the patient was advised to have a venous duplex ultrasonography test performed to rule out LE DVT. There were no slots or appointments with any of our physicians today. Dr. Murlean Caller recommended patient be seen in the [facility]. Patient declined stating he would come tomorrow for an appointment. Front Dietitian were to schedule him an appointment for tomorrow. Patient denies dyspnea or chest pain currently. I increased his dose of warfarin and per his request sent a refill authorization to [employer].

## 2012-04-02 ENCOUNTER — Ambulatory Visit: Payer: Self-pay | Admitting: Internal Medicine

## 2012-04-02 NOTE — Telephone Encounter (Signed)
Agree, I discussed this with Dr. Elie Confer as below.

## 2012-04-04 ENCOUNTER — Ambulatory Visit: Payer: Self-pay | Admitting: Internal Medicine

## 2012-04-08 ENCOUNTER — Other Ambulatory Visit: Payer: Self-pay | Admitting: Nephrology

## 2012-04-08 DIAGNOSIS — N049 Nephrotic syndrome with unspecified morphologic changes: Secondary | ICD-10-CM

## 2012-04-10 ENCOUNTER — Ambulatory Visit
Admission: RE | Admit: 2012-04-10 | Discharge: 2012-04-10 | Disposition: A | Discharge status: Home / Self Care | Payer: BC Managed Care – PPO | Source: Ambulatory Visit | Attending: Nephrology | Admitting: Nephrology

## 2012-04-10 DIAGNOSIS — N049 Nephrotic syndrome with unspecified morphologic changes: Secondary | ICD-10-CM

## 2012-04-10 NOTE — Progress Notes (Signed)
I have reviewed Dr. Gladstone Pih note.  Indication for anticoagulation is DVT.  Agree with plan as documented

## 2012-05-06 ENCOUNTER — Ambulatory Visit (INDEPENDENT_AMBULATORY_CARE_PROVIDER_SITE_OTHER): Payer: BC Managed Care – PPO | Admitting: Pharmacist

## 2012-05-06 DIAGNOSIS — Z86718 Personal history of other venous thrombosis and embolism: Secondary | ICD-10-CM

## 2012-05-06 DIAGNOSIS — Z7901 Long term (current) use of anticoagulants: Secondary | ICD-10-CM

## 2012-05-06 DIAGNOSIS — I824Y2 Acute embolism and thrombosis of unspecified deep veins of left proximal lower extremity: Secondary | ICD-10-CM

## 2012-05-06 DIAGNOSIS — I824Y9 Acute embolism and thrombosis of unspecified deep veins of unspecified proximal lower extremity: Secondary | ICD-10-CM

## 2012-05-06 LAB — POCT INR: INR: 3.3

## 2012-05-06 NOTE — Patient Instructions (Signed)
Patient instructed to take medications as defined in the Anti-coagulation Track section of this encounter.  Patient instructed to take today's dose.  Patient verbalized understanding of these instructions.    

## 2012-05-06 NOTE — Progress Notes (Signed)
Anti-Coagulation Progress Note  [name] is a 36 y.o. male who is currently on an anti-coagulation regimen.    RECENT RESULTS: Recent results are below, the most recent result is correlated with a dose of 92.5 mg. per week: He has scheduled per [facility] Dr. Abagail Kitchens (DPM) planned open gastrocnemius resection for relief of "flat footedness" of right foot which has been attributed to his blister/ulceration of the plantar surface of right foot. Patient understands the risks associated with discontinuation of warfarin around the surgery (risk of DVT/PE/Death); and the risks of NOT discontinuing warfarin 5 days minimally BEFORE PLANNED PROCEDURE on 20-MAY-14, i.e. Last dose of warfarin would be the dose on 15-MAY-14. He understands and verbalizes that the risks for NOT discontinuing warfarin would be an increased risk of bleeding which could result in a lowered hemoglobin and hematocrit which would decrease oxygenated blood flow to his heart, brain and other vital organs; increased risks of hematoma formation at operative site which could result in compression of arteries, veins and nerves of his lower extremity.  Lab Results  Component Value Date   INR 3.3 05/06/2012   INR 1.00 03/18/2012   INR 4.30 02/26/2012    ANTI-COAG DOSE: Anticoagulation Dose Instructions as of 05/06/2012     [name] Tue Wed Thu Fri Sat   New Dose 12.5 mg 12.5 mg 12.5 mg 12.5 mg 12.5 mg 12.5 mg 12.5 mg       ANTICOAG SUMMARY: Anticoagulation Episode Summary   Current INR goal 2.0-3.0  Next INR check 05/20/2012  INR from last check 3.3! (05/06/2012)  Weekly max dose   Target end date   [facility]  Preferred lab   Send INR reminders to    Indications  DVT HX OF [V12.51] Acute venous embolism and thrombosis of deep vessels of proximal lower extremity [453.41] Long term (current) use of anticoagulants [V58.61]        Comments         ANTICOAG TODAY: Anticoagulation  Summary as of 05/06/2012   INR goal 2.0-3.0  Selected INR 3.3! (05/06/2012)  Next INR check 05/20/2012  Target end date    Indications  DVT HX OF [V12.51] Acute venous embolism and thrombosis of deep vessels of proximal lower extremity [453.41] Long term (current) use of anticoagulants [V58.61]      Anticoagulation Episode Summary   [facility]   Preferred lab    Send INR reminders to    Comments       PATIENT INSTRUCTIONS: Patient Instructions  Patient instructed to take medications as defined in the Anti-coagulation Track section of this encounter.  Patient instructed to take today's dose.  Patient verbalized understanding of these instructions.       FOLLOW-UP Return in 2 weeks (on 05/20/2012) for Follow up INR at 2:45PM.  Jorene Guest, III Pharm.D., CACP

## 2012-05-20 ENCOUNTER — Ambulatory Visit: Payer: BC Managed Care – PPO

## 2012-06-10 ENCOUNTER — Telehealth: Payer: Self-pay | Admitting: *Deleted

## 2012-06-10 DIAGNOSIS — Z5329 Procedure and treatment not carried out because of patient's decision for other reasons: Secondary | ICD-10-CM

## 2012-06-10 DIAGNOSIS — N049 Nephrotic syndrome with unspecified morphologic changes: Secondary | ICD-10-CM

## 2012-06-10 DIAGNOSIS — E1051 Type 1 diabetes mellitus with diabetic peripheral angiopathy without gangrene: Secondary | ICD-10-CM

## 2012-06-10 NOTE — Telephone Encounter (Signed)
Pt needs appointment asap to address DM

## 2012-06-10 NOTE — Telephone Encounter (Signed)
Will have front office schedule appointment.

## 2012-06-10 NOTE — Telephone Encounter (Signed)
I received a fax from [facility] with A1C results stating pt's insulin may need to be adjusted. 06/07/12  A1C - 12.3 03/30/12  A1C - 11.1  Forms are in your box in clinic.

## 2012-06-10 NOTE — Telephone Encounter (Signed)
He has not been seen in clinic since February.  He will need a follow up in clinic scheduled ASAP to address his diabetes as well as his weight gain and nephrotic syndrome.  I will also send a referral to [facility] to assess if there are any resources we can bring to bear to help [name] get to his appointments.

## 2012-06-14 ENCOUNTER — Telehealth: Payer: Self-pay | Admitting: Licensed Clinical Social Worker

## 2012-06-14 NOTE — Telephone Encounter (Signed)
[name] was referred to CSW to inquire about barriers to pt's [facility] appointments.  Pt is a chronic no-show.  CSW placed call to [name].  Pt states he has been attending his podiatry appt's and is planning to have surgery.  [name] states he is aware he has an appt on Monday 6/9 and will attend this appointment.  Pt denied any specific barriers to appointment attendance.  CSW will sign off.

## 2012-06-14 NOTE — Telephone Encounter (Signed)
Appointment scheduled.

## 2012-06-17 ENCOUNTER — Ambulatory Visit: Payer: BC Managed Care – PPO

## 2012-06-17 ENCOUNTER — Ambulatory Visit (INDEPENDENT_AMBULATORY_CARE_PROVIDER_SITE_OTHER): Payer: BC Managed Care – PPO | Admitting: Pharmacist

## 2012-06-17 ENCOUNTER — Ambulatory Visit (INDEPENDENT_AMBULATORY_CARE_PROVIDER_SITE_OTHER): Payer: BC Managed Care – PPO | Admitting: Internal Medicine

## 2012-06-17 ENCOUNTER — Encounter: Payer: Self-pay | Admitting: Internal Medicine

## 2012-06-17 VITALS — BP 136/69 | HR 90 | Temp 97.4°F | Ht 73.5 in | Wt 344.1 lb

## 2012-06-17 DIAGNOSIS — Z86718 Personal history of other venous thrombosis and embolism: Secondary | ICD-10-CM

## 2012-06-17 DIAGNOSIS — E1051 Type 1 diabetes mellitus with diabetic peripheral angiopathy without gangrene: Secondary | ICD-10-CM

## 2012-06-17 DIAGNOSIS — E1065 Type 1 diabetes mellitus with hyperglycemia: Secondary | ICD-10-CM

## 2012-06-17 DIAGNOSIS — I798 Other disorders of arteries, arterioles and capillaries in diseases classified elsewhere: Secondary | ICD-10-CM

## 2012-06-17 DIAGNOSIS — Z7901 Long term (current) use of anticoagulants: Secondary | ICD-10-CM

## 2012-06-17 DIAGNOSIS — E1059 Type 1 diabetes mellitus with other circulatory complications: Secondary | ICD-10-CM

## 2012-06-17 DIAGNOSIS — I824Y9 Acute embolism and thrombosis of unspecified deep veins of unspecified proximal lower extremity: Secondary | ICD-10-CM

## 2012-06-17 DIAGNOSIS — I10 Essential (primary) hypertension: Secondary | ICD-10-CM

## 2012-06-17 MED ORDER — INSULIN NPH ISOPHANE & REGULAR (70-30) 100 UNIT/ML ~~LOC~~ SUSP: SUBCUTANEOUS | Status: DC

## 2012-06-17 MED ORDER — WARFARIN SODIUM 5 MG PO TABS: ORAL_TABLET | ORAL | Status: DC

## 2012-06-17 NOTE — Progress Notes (Signed)
HPI The patient is a 37 y.o. male with a history of DM, HTN, diabetic foot ulcer, presenting for a follow-up visit.  The patient has a history of DM.  His last A1C was 12, 1 week ago.  He has been stretching his insulin due to financial constraints, and only using his morning insulin (45 units), skipping his evening insulin (40 units).  The pateint did not bring his glucometer today.  The patient notes no recent episodes of hypoglycemia, though his blood sugar was 70 in clinic today, which he attributes to exercise this morning.  The pateint is currently checking his blood sugar twice per day, before breakfast and after dinner.  Blood sugars are reportedly in the 160's in the mornings, though have been as high as the 300's in the evenings.  The patient notes never having an eye exam, but he expresses interest in getting one today.  The patient notes that he has been seeing a Psychologist, sport and exercise, and has a right leg surgery planned for the near future.  We discussed how diabetes can impair wound healing, and the patient appears very interested in getting his blood sugars under control.  The patient has blue cross blue shield.  After a search online, it appears both 70/30 and Lantus are tier 3 medications.  There are no cheaper forms of insulin for the patient under his present insurance.  ROS: General: no fevers, chills, changes in weight, changes in appetite Skin: no rash HEENT: no blurry vision, hearing changes, sore throat Pulm: no dyspnea, coughing, wheezing CV: no chest pain, palpitations, shortness of breath Abd: no abdominal pain, nausea/vomiting, diarrhea/constipation GU: no dysuria, hematuria, polyuria Ext: no arthralgias, myalgias Neuro: no weakness, numbness, or tingling  Filed Vitals:   06/17/12 1540  BP: 136/69  Pulse: 90  Temp: 97.4 F (36.3 C)    Outside Records from patient's Podiatrist: A1C: 12.3 (06/07/12) A1C: 11.1 (03/30/12)  PEX General: alert, cooperative, and in no apparent  distress HEENT: pupils equal round and reactive to light, vision grossly intact, oropharynx clear and non-erythematous  Neck: supple, no lymphadenopathy Lungs: clear to ascultation bilaterally, normal work of respiration, no wheezes, rales, ronchi Heart: regular rate and rhythm, no murmurs, gallops, or rubs Abdomen: soft, non-tender, non-distended, normal bowel sounds Extremities: no cyanosis, clubbing, or edema.  Patient wearing a boot on his right ankle Neurologic: alert & oriented X3, cranial nerves II-XII intact, strength grossly intact, sensation intact to light touch  Current Outpatient Prescriptions on File Prior to Visit  Medication Sig Dispense Refill  . insulin NPH-insulin regular (NOVOLIN 70/30) (70-30) 100 UNIT/ML injection Inject 40 units in AM and 35 units in PM  10 mL  0  . lisinopril (PRINIVIL,ZESTRIL) 10 MG tablet Take 1 tablet (10 mg total) by mouth daily.  30 tablet  1  . metFORMIN (GLUCOPHAGE) 500 MG tablet Take 2 tablets (1,000 mg total) by mouth 2 (two) times daily with a meal.  120 tablet  6  . warfarin (COUMADIN) 5 MG tablet Take as directed by anticoagulation clinic provider. Currently taking 3 tablets Monday/Thursday; 2 and 1/2 tablets all other days.  75 tablet  2   No current facility-administered medications on file prior to visit.    Assessment/Plan

## 2012-06-17 NOTE — Assessment & Plan Note (Signed)
BP Readings from Last 3 Encounters:  06/17/12 136/69  02/19/12 145/83  02/12/12 105/82    Lab Results  Component Value Date   NA 138 02/19/2012   K 4.0 02/19/2012   CREATININE 0.92 02/19/2012    Assessment: Blood pressure control: controlled Progress toward BP goal:  at goal Comments:  BP well-controlled with only lisinopril  Plan: Medications:  continue current medications Educational resources provided: brochure Self management tools provided: home blood pressure logbook Other plans: Recheck at next visit

## 2012-06-17 NOTE — Patient Instructions (Signed)
Patient instructed to take medications as defined in the Anti-coagulation Track section of this encounter.  Patient instructed to take today's dose.  Patient verbalized understanding of these instructions.    

## 2012-06-17 NOTE — Patient Instructions (Signed)
General Instructions: For your diabetes, continue to use Novolog 70/30, 45 units every morning and 40 units every evening.  Please return for a follow-up visit in 1 week.  Treatment Goals:  Goals (1 Years of Data) as of 06/17/12         As of Today 02/19/12 02/12/12 02/12/12 02/12/12     Blood Pressure    . Blood Pressure < 140/90  136/69 145/83 105/82 136/79 137/75     Result Component    . HEMOGLOBIN A1C < 7.0          . LDL CALC < 100   74         Progress Toward Treatment Goals:  Treatment Goal 06/17/2012  Hemoglobin A1C unchanged  Blood pressure at goal    Self Care Goals & Plans:  Self Care Goal 06/17/2012  Manage my medications take my medicines as prescribed; bring my medications to every visit; refill my medications on time  Monitor my health keep track of my blood glucose; check my feet daily; bring my glucose meter and log to each visit  Eat healthy foods drink diet soda or water instead of juice or soda; eat foods that are low in salt; eat baked foods instead of fried foods; eat smaller portions  Be physically active (No Data)    Home Blood Glucose Monitoring 06/17/2012  Check my blood sugar 2 times a day  When to check my blood sugar before breakfast; before dinner     Care Management & Community Referrals:  Referral 06/17/2012  Referrals made for care management support none needed

## 2012-06-17 NOTE — Assessment & Plan Note (Addendum)
Lab Results  Component Value Date   HGBA1C 8.2 02/09/2012   HGBA1C 11.8 07/11/2011   HGBA1C 14.0 12/29/2010     Assessment: Diabetes control: poor control (HgbA1C >9%) Progress toward A1C goal:  unchanged Comments: The patient's A1C was reportedly 12.3 at his podiatrist's office last week.  He has only been using 70/30 insulin once per day, rather than twice per day.  Since his blood sugar was 70 in clinic today, I don't feel comfortable increasing his dosage until he is reliably taking his current dosage.  The patient appears motivated to improve his health.  Plan: Medications:  continue current medications Home glucose monitoring: Frequency: 2 times a day Timing: before breakfast;before dinner Instruction/counseling given: reminded to bring blood glucose meter & log to each visit Educational resources provided: brochure Self management tools provided:   Other plans: Patient referred for eye exam.  Checking urine microalbumin today

## 2012-06-17 NOTE — Progress Notes (Signed)
Anti-Coagulation Progress Note  [name] is a 37 y.o. male who is currently on an anti-coagulation regimen.    RECENT RESULTS: Recent results are below, the most recent result is correlated with a dose of 87.5 mg. per week: Lab Results  Component Value Date   INR 2.50 06/17/2012   INR 3.3 05/06/2012   INR 1.00 03/18/2012    ANTI-COAG DOSE: Anticoagulation Dose Instructions as of 06/17/2012     Sun Mon Tue Wed Thu Fri Sat   New Dose 12.5 mg 12.5 mg 12.5 mg 12.5 mg 12.5 mg 12.5 mg 12.5 mg       ANTICOAG SUMMARY: Anticoagulation Episode Summary   Current INR goal 2.0-3.0  Next INR check 07/08/2012  INR from last check 2.50 (06/17/2012)  Weekly max dose   Target end date   [facility]  Preferred lab   Send INR reminders to    Indications  DVT HX OF [V12.51] Acute venous embolism and thrombosis of deep vessels of proximal lower extremity [453.41] Long term (current) use of anticoagulants [V58.61]        Comments         ANTICOAG TODAY: Anticoagulation Summary as of 06/17/2012   INR goal 2.0-3.0  Selected INR 2.50 (06/17/2012)  Next INR check 07/08/2012  Target end date    Indications  DVT HX OF [V12.51] Acute venous embolism and thrombosis of deep vessels of proximal lower extremity [453.41] Long term (current) use of anticoagulants [V58.61]      Anticoagulation Episode Summary   [facility] check [facility]   Preferred lab    Send INR reminders to    Comments       PATIENT INSTRUCTIONS: Patient Instructions  Patient instructed to take medications as defined in the Anti-coagulation Track section of this encounter.  Patient instructed to take today's dose.  Patient verbalized understanding of these instructions.       FOLLOW-UP Return in 3 weeks (on 07/08/2012) for Follow up INR at 2:15PM.  Jorene Guest, III Pharm.D., CACP

## 2012-06-18 LAB — MICROALBUMIN / CREATININE URINE RATIO
Creatinine, Urine: 506 mg/dL
Microalb Creat Ratio: 83.3 mg/g — ABNORMAL HIGH (ref 0.0–30.0)

## 2012-06-18 NOTE — Progress Notes (Signed)
Case discussed with Dr. Brown (at time of visit, soon after the resident saw the patient).  We reviewed the resident's history and exam and pertinent patient test results.  I agree with the assessment, diagnosis, and plan of care documented in the resident's note. 

## 2012-06-24 ENCOUNTER — Ambulatory Visit: Payer: BC Managed Care – PPO | Admitting: Internal Medicine

## 2012-07-08 ENCOUNTER — Ambulatory Visit: Payer: BC Managed Care – PPO

## 2012-07-18 ENCOUNTER — Other Ambulatory Visit: Payer: Self-pay

## 2012-10-02 ENCOUNTER — Encounter: Payer: Self-pay | Admitting: Internal Medicine

## 2012-10-02 ENCOUNTER — Emergency Department (HOSPITAL_COMMUNITY)
Admission: EM | Admit: 2012-10-02 | Discharge: 2012-10-02 | Disposition: A | Discharge status: Home / Self Care | Payer: BC Managed Care – PPO | Attending: Emergency Medicine | Admitting: Emergency Medicine

## 2012-10-02 ENCOUNTER — Encounter (HOSPITAL_COMMUNITY): Payer: Self-pay | Admitting: Physical Medicine and Rehabilitation

## 2012-10-02 ENCOUNTER — Ambulatory Visit (INDEPENDENT_AMBULATORY_CARE_PROVIDER_SITE_OTHER): Payer: BC Managed Care – PPO | Admitting: Internal Medicine

## 2012-10-02 VITALS — BP 135/86 | HR 87 | Temp 97.4°F | Ht 73.0 in | Wt 357.8 lb

## 2012-10-02 DIAGNOSIS — M7989 Other specified soft tissue disorders: Secondary | ICD-10-CM | POA: Insufficient documentation

## 2012-10-02 DIAGNOSIS — L0291 Cutaneous abscess, unspecified: Secondary | ICD-10-CM

## 2012-10-02 DIAGNOSIS — Z7901 Long term (current) use of anticoagulants: Secondary | ICD-10-CM | POA: Insufficient documentation

## 2012-10-02 DIAGNOSIS — L03019 Cellulitis of unspecified finger: Secondary | ICD-10-CM | POA: Insufficient documentation

## 2012-10-02 DIAGNOSIS — Z79899 Other long term (current) drug therapy: Secondary | ICD-10-CM | POA: Insufficient documentation

## 2012-10-02 DIAGNOSIS — L02519 Cutaneous abscess of unspecified hand: Secondary | ICD-10-CM | POA: Insufficient documentation

## 2012-10-02 DIAGNOSIS — E1051 Type 1 diabetes mellitus with diabetic peripheral angiopathy without gangrene: Secondary | ICD-10-CM

## 2012-10-02 DIAGNOSIS — I798 Other disorders of arteries, arterioles and capillaries in diseases classified elsewhere: Secondary | ICD-10-CM

## 2012-10-02 DIAGNOSIS — Z8679 Personal history of other diseases of the circulatory system: Secondary | ICD-10-CM | POA: Insufficient documentation

## 2012-10-02 DIAGNOSIS — Z794 Long term (current) use of insulin: Secondary | ICD-10-CM | POA: Insufficient documentation

## 2012-10-02 DIAGNOSIS — E1059 Type 1 diabetes mellitus with other circulatory complications: Secondary | ICD-10-CM

## 2012-10-02 DIAGNOSIS — Z86718 Personal history of other venous thrombosis and embolism: Secondary | ICD-10-CM | POA: Insufficient documentation

## 2012-10-02 DIAGNOSIS — E119 Type 2 diabetes mellitus without complications: Secondary | ICD-10-CM | POA: Insufficient documentation

## 2012-10-02 LAB — GLUCOSE, CAPILLARY: Glucose-Capillary: 265 mg/dL — ABNORMAL HIGH (ref 70–99)

## 2012-10-02 MED ORDER — METFORMIN HCL 500 MG PO TABS: 1000.0000 mg | ORAL_TABLET | Freq: Two times a day (BID) | ORAL | Status: DC

## 2012-10-02 MED ORDER — HYDROCODONE-ACETAMINOPHEN 5-325 MG PO TABS: ORAL_TABLET | ORAL | Status: DC

## 2012-10-02 MED ORDER — HYDROCODONE-ACETAMINOPHEN 5-325 MG PO TABS
2.0000 | ORAL_TABLET | Freq: Once | ORAL | Status: AC
  Administered 2012-10-02: 2 via ORAL
  Filled 2012-10-02: qty 2

## 2012-10-02 NOTE — ED Provider Notes (Signed)
CSN: [id number]     Arrival date & time 10/02/12  1001 History  This chart was scribed for non-physician practitioner Noland Fordyce, PA-C, working with Julianne Rice, MD by Zettie Pho, ED Scribe. This patient was seen in room TR09C/TR09C and the patient's care was started at 10:17 AM.    Chief Complaint  Patient presents with  . Abscess   The history is provided by the patient. No language interpreter was used.   HPI Comments: [name] is a 37 y.o. male who presents to the Emergency Department complaining of an abscess with associated swelling and an aching pain, 7/10 currently,  to the left thumb onset 4-5 days ago. Patient reports that it may be due to an accident that occurred at the gym. He reports a history of finger abscesses several years ago. Patient reports a history of DM that has not been well-controlled recently, CBGs in 200s. Patient is left-handed.   PCP- Dr. Margart Sickles  Past Medical History  Diagnosis Date  . Diabetes mellitus   . DVT (deep venous thrombosis) 09/2002    patient reports additional DVTs in '06 & '11 (unconfirmed)  . History of hypertension     resolved after weight loss  . Pulmonary embolism 09/2002    treated with 6 months of warfarin   Past Surgical History  Procedure Laterality Date  . Back abscess i&d  2007   Family History  Problem Relation Age of Onset  . Heart disease    . Diabetes    . Obesity     History  Substance Use Topics  . Smoking status: Never Smoker   . Smokeless tobacco: Not on file  . Alcohol Use: Yes     Comment: occasional, q3w 2 beers    Review of Systems  Musculoskeletal: Positive for myalgias and joint swelling.  Skin: Negative for wound.  All other systems reviewed and are negative.    Allergies  Review of patient's allergies indicates no known allergies.  Home Medications   Current Outpatient Rx  Name  Route  Sig  Dispense  Refill  . insulin NPH-regular (NOVOLIN 70/30) (70-30) 100 UNIT/ML injection    Subcutaneous   Inject 40-45 Units into the skin 2 (two) times daily with a meal. Inject 45 units in the morning and 40 units in the evening         . lisinopril-hydrochlorothiazide (PRINZIDE,ZESTORETIC) 20-25 MG per tablet   Oral   Take 1 tablet by mouth daily.         . metFORMIN (GLUCOPHAGE) 500 MG tablet   Oral   Take 2 tablets (1,000 mg total) by mouth 2 (two) times daily with a meal.   120 tablet   6   . warfarin (COUMADIN) 5 MG tablet   Oral   Take 12.5 mg by mouth daily.         Marland Kitchen HYDROcodone-acetaminophen (NORCO/VICODIN) 5-325 MG per tablet      Take 1-2 pills every 4-6 hours as needed for pain.   6 tablet   0    Triage Vitals: BP 139/78  Pulse 88  Temp(Src) 98.2 F (36.8 C) (Oral)  Resp 18  SpO2 98%  Physical Exam  Nursing note and vitals reviewed. Constitutional: He appears well-developed and well-nourished.  HENT:  Head: Normocephalic and atraumatic.  Eyes: Conjunctivae are normal. No scleral icterus.  Neck: Normal range of motion.  Abdominal: Soft. Bowel sounds are normal. He exhibits no distension and no mass. There is no tenderness. There is  no rebound and no guarding.  Musculoskeletal: Normal range of motion.       Hands: Neurological: He is alert.  Skin: Skin is warm and dry.  Area of mild edema, blanching of the skin, and exquisite tenderness to palpation around the nail bed to the left thumb. Capillary refill < 2 seconds. Pain with flexion of the thumb    ED Course  Procedures (including critical care time)  DIAGNOSTIC STUDIES: Oxygen Saturation is 98% on room air, normal by my interpretation.    COORDINATION OF CARE: 10:18AM- Will perform an incision and drainage to the area. Advised patient to soak the area several times a day in warm water to help encourage drainage. Will order Norco to manage pain symptoms. Will discharge patient with norco (6 tabs) Pt takes warfarin which interacts with ibuprofen.  Advised pt finger should heal w/o  complication, however advised to f/u with PCP in 2-3 days if not improving. Discussed treatment plan with patient at bedside and patient verbalized agreement.   INCISION AND DRAINAGE Performed by: Noland Fordyce, PA-C Consent: Verbal consent obtained. Risks and benefits: risks, benefits and alternatives were discussed Type: abscess Body area: left thumb Anesthesia: local infiltration Incision was made with a scalpel. Local anesthetic: lidocaine 2% without epinephrine Anesthetic total: 1 ml Complexity: simple Blunt dissection to break up loculations Drainage: serosanguinous  Drainage amount: scant Patient tolerance: Patient tolerated the procedure well with no immediate complications.    Labs Review Labs Reviewed  GLUCOSE, CAPILLARY - Abnormal; Notable for the following:    Glucose-Capillary 242 (*)    All other components within normal limits   Imaging Review No results found.  MDM   1. Abscess     I personally performed the services described in this documentation, which was scribed in my presence. The recorded information has been reviewed and is accurate.    Noland Fordyce, PA-C 10/02/12 1552

## 2012-10-02 NOTE — ED Notes (Signed)
Patient stated he feels much better.   Patient states his sugar "runs high".  Patient left.

## 2012-10-02 NOTE — ED Notes (Signed)
Pt presents to department for evaluation of L thumb swelling and pain. Ongoing x1 week. Was seen at outpatient clinic and sent to ED for possible I&D. 8/10 pain upon arrival. No drainage noted. NAD.

## 2012-10-02 NOTE — ED Notes (Signed)
Patient was getting ready to walk out and advised "i feel woozy.  I'm gonna sit down for a few minutes".   Advised PA to recheck.

## 2012-10-02 NOTE — Assessment & Plan Note (Signed)
Patient with pain and swelling of the left thumb. On exam, notable fluctuance, erythema, increased warmth, and tenderness at the base of the thumb nail.  -Patient was sent to the ED for I&D. Procedure was performed there without complications and patient was sent home and told to soak thumb to promote further drainage and given Norco for pain in the ED.

## 2012-10-02 NOTE — Assessment & Plan Note (Signed)
Patient presents today with HbA1c of 12.0. He has a history of uncontrolled DM and medication non-compliance. Today he claims he has not been taking his Metformin for the past 3 weeks because he ran out. Says his blood sugar at home has been in the 300's at home recently. Patient was given a refill of Metformin and is scheduled to see his PCP, Dr. Vernelle Emerald on 11/12/12 for follow up.

## 2012-10-02 NOTE — Progress Notes (Signed)
Patient ID: [name], male   DOB: 1975-05-16, 37 y.o.   MRN: [id number]   Subjective:   Patient ID: [name] male   DOB: May 26, 1975 37 y.o.   MRN: [id number]  HPI: [name] is a 37 y.o. M w/ PMHx of DM type II and HTN, presents to the clinic today with complaints of swelling and pain and tenderness of the left thumb proximal to the thumb nail. The patient says this started about a week or so ago and started with pain and then noticed swelling at the base of his thumb nail. He says it is quite tender to the touch and now the pain extends all the way to the base of the thumb. He claims he has had a similar issue with his right third finger in 2007, at which time he had an I&D and was given Abx. He denies any recent fever, chills, nausea, or vomiting. On exam, the patient has notable tenderness and swelling at the base of the thumb nail. The skin is tense with obvious fluctuance. The left thumb is notably warmer than the right. Otherwise, the patient denies any further issues. Today, his HbA1c was 12.0 and the patient has a history of uncontrolled DM w/ complications involving foot ulcers in the past. He also has a history of medication non-compliance, specifically at his last visit, he had reportedly not been taking his recommended dose of 70/30 insulin. Today, he reports that he has not been taking his Metformin for the past 2-3 weeks and has had blood sugars at home in the 300's.    Past Medical History  Diagnosis Date  . Diabetes mellitus   . DVT (deep venous thrombosis) 09/2002    patient reports additional DVTs in '06 & '11 (unconfirmed)  . History of hypertension     resolved after weight loss  . Pulmonary embolism 09/2002    treated with 6 months of warfarin   Current Outpatient Prescriptions  Medication Sig Dispense Refill  . insulin NPH-regular (NOVOLIN 70/30) (70-30) 100 UNIT/ML injection Inject 45 units in AM and 40 units in PM  10 mL  11  . lisinopril (PRINIVIL,ZESTRIL)  10 MG tablet Take 1 tablet (10 mg total) by mouth daily.  30 tablet  1  . metFORMIN (GLUCOPHAGE) 500 MG tablet Take 2 tablets (1,000 mg total) by mouth 2 (two) times daily with a meal.  120 tablet  6  . warfarin (COUMADIN) 5 MG tablet Take as directed by anticoagulation clinic provider. Currently taking 2&1/2 tablets daily.  75 tablet  2   No current facility-administered medications for this visit.   Family History  Problem Relation Age of Onset  . Heart disease    . Diabetes    . Obesity     History   Social History  . Marital Status: Single    Spouse Name: N/A    Number of Children: N/A  . Years of Education: N/A   Occupational History  . [employer]   . student    Social History Main Topics  . Smoking status: Never Smoker   . Smokeless tobacco: None  . Alcohol Use: Yes     Comment: occasional, q3w 2 beers  . Drug Use: No  . Sexual Activity: None   Other Topics Concern  . None   Social History Narrative   Financial assistance approved for 100% discount at [facility] and has [facility] card per [employer]   09/01/2009.      Lives in [place] with  mother and sister.               Review of Systems: General: Denies fever, chills, diaphoresis, appetite change and fatigue.  Respiratory: Denies SOB, DOE, cough, chest tightness, and wheezing.   Cardiovascular: Denies chest pain, palpitations and leg swelling.  Gastrointestinal: Denies nausea, vomiting, abdominal pain, diarrhea, constipation, blood in stool and abdominal distention.  Genitourinary: Denies dysuria, urgency, frequency, hematuria, flank pain and difficulty urinating.  Endocrine: Denies hot or cold intolerance, sweats, polyuria, polydipsia. Musculoskeletal: Denies myalgias, back pain, joint swelling, arthralgias and gait problem.  Skin: Positive for pain and swelling of the left thumb. Denies pallor, rash and wounds.  Neurological: Denies dizziness, seizures, syncope, weakness, lightheadedness, numbness and  headaches.  Psychiatric/Behavioral: Denies mood changes, confusion, nervousness, sleep disturbance and agitation.  Objective:  Physical Exam: Filed Vitals:   10/02/12 0853  BP: 135/86  Pulse: 87  Temp: 97.4 F (36.3 C)  TempSrc: Oral  Height: 6\' 1"  (1.854 m)  Weight: 357 lb 12.8 oz (162.297 kg)  SpO2: 98%   General: Vital signs reviewed.  Patient is a well-developed and well-nourished, in no acute distress and cooperative with exam. Alert and oriented x3.  Head: Normocephalic and atraumatic. Eyes: PERRL, EOMI, conjunctivae normal, No scleral icterus.  Neck: Supple, trachea midline, normal ROM, No JVD, masses, thyromegaly, or carotid bruit present.  Cardiovascular: RRR, S1 normal, S2 normal, no murmurs, gallops, or rubs. Pulmonary/Chest: Normal respiratory effort, CTAB, no wheezes, rales, or rhonchi. Abdominal: Soft, non-tender, non-distended, bowel sounds are normal, no masses, organomegaly, or guarding present.  Musculoskeletal: No joint deformities, erythema, or stiffness, ROM full and no nontender. Extremities: Obvious swelling, increased warmth, erythema, and tenderness of the left thumb. Notable fluctuance at the base of the thumb nail. No edema,  pulses symmetric and intact bilaterally. No cyanosis or clubbing. Neurological: A&O x3, Strength is normal and symmetric bilaterally, cranial nerve II-XII are grossly intact, no focal motor deficit, sensory intact to light touch bilaterally.  Skin: Warm, dry and intact. No rashes or erythema. Psychiatric: Normal mood and affect. speech and behavior is normal. Cognition and memory are normal.   Assessment & Plan:   Please see problem-based assessment and plan.

## 2012-10-03 NOTE — Progress Notes (Signed)
I saw and evaluated the patient.  I personally confirmed the key portions of the history and exam documented by Dr. Jones and I reviewed pertinent patient test results.  The assessment, diagnosis, and plan were formulated together and I agree with the documentation in the resident's note.   

## 2012-10-03 NOTE — ED Provider Notes (Signed)
Medical screening examination/treatment/procedure(s) were performed by non-physician practitioner and as supervising physician I was immediately available for consultation/collaboration.   Julianne Rice, MD 10/03/12 1251

## 2012-10-21 ENCOUNTER — Ambulatory Visit (INDEPENDENT_AMBULATORY_CARE_PROVIDER_SITE_OTHER): Payer: Self-pay | Admitting: Pharmacist

## 2012-10-21 ENCOUNTER — Encounter: Payer: Self-pay | Admitting: Dietician

## 2012-10-21 ENCOUNTER — Other Ambulatory Visit (INDEPENDENT_AMBULATORY_CARE_PROVIDER_SITE_OTHER): Payer: Self-pay

## 2012-10-21 ENCOUNTER — Other Ambulatory Visit: Payer: Self-pay | Admitting: Dietician

## 2012-10-21 DIAGNOSIS — IMO0002 Reserved for concepts with insufficient information to code with codable children: Secondary | ICD-10-CM

## 2012-10-21 DIAGNOSIS — Z7901 Long term (current) use of anticoagulants: Secondary | ICD-10-CM

## 2012-10-21 DIAGNOSIS — I798 Other disorders of arteries, arterioles and capillaries in diseases classified elsewhere: Secondary | ICD-10-CM

## 2012-10-21 DIAGNOSIS — E1059 Type 1 diabetes mellitus with other circulatory complications: Secondary | ICD-10-CM

## 2012-10-21 DIAGNOSIS — Z86718 Personal history of other venous thrombosis and embolism: Secondary | ICD-10-CM

## 2012-10-21 DIAGNOSIS — E1051 Type 1 diabetes mellitus with diabetic peripheral angiopathy without gangrene: Secondary | ICD-10-CM

## 2012-10-21 DIAGNOSIS — I824Y9 Acute embolism and thrombosis of unspecified deep veins of unspecified proximal lower extremity: Secondary | ICD-10-CM

## 2012-10-21 LAB — HM DIABETES EYE EXAM

## 2012-10-21 NOTE — Patient Instructions (Signed)
Patient instructed to take medications as defined in the Anti-coagulation Track section of this encounter.  Patient instructed to take today's dose.  Patient verbalized understanding of these instructions.    

## 2012-10-21 NOTE — Progress Notes (Signed)
Anti-Coagulation Progress Note  [name] is a 37 y.o. male who is currently on an anti-coagulation regimen.    RECENT RESULTS: Recent results are below, the most recent result is correlated with a dose of 87.5 mg. per week: Lab Results  Component Value Date   INR 4.70 10/21/2012   INR 2.50 06/17/2012   INR 3.3 05/06/2012    ANTI-COAG DOSE: Anticoagulation Dose Instructions as of 10/21/2012     Sun Mon Tue Wed Thu Fri Sat   New Dose 12.5 mg 12.5 mg 7.5 mg 12.5 mg 7.5 mg 12.5 mg 7.5 mg       ANTICOAG SUMMARY: Anticoagulation Episode Summary   Current INR goal 2.0-3.0  Next INR check 10/28/2012  INR from last check 4.70! (10/21/2012)  Weekly max dose   Target end date   [facility] check [facility]  Preferred lab   Send INR reminders to    Indications  DVT HX OF [V12.51] Acute venous embolism and thrombosis of deep vessels of proximal lower extremity [453.41] Long term (current) use of anticoagulants [V58.61]        Comments         ANTICOAG TODAY: Anticoagulation Summary as of 10/21/2012   INR goal 2.0-3.0  Selected INR 4.70! (10/21/2012)  Next INR check 10/28/2012  Target end date    Indications  DVT HX OF [V12.51] Acute venous embolism and thrombosis of deep vessels of proximal lower extremity [453.41] Long term (current) use of anticoagulants [V58.61]      Anticoagulation Episode Summary   INR check [facility]   Preferred lab    Send INR reminders to    Comments       PATIENT INSTRUCTIONS: Patient Instructions  Patient instructed to take medications as defined in the Anti-coagulation Track section of this encounter.  Patient instructed to take today's dose.  Patient verbalized understanding of these instructions.       FOLLOW-UP Return in 7 days (on 10/28/2012) for Follow up INR at 11:55AM.  Jorene Guest, III Pharm.D., CACP

## 2012-10-21 NOTE — Progress Notes (Signed)
Patient in to see Dr. Elie Confer today, he agreed to retinal photo diabetes eye exam.

## 2012-10-22 NOTE — Progress Notes (Signed)
I have reviewed Dr. Gladstone Pih note and plan.  [name] is on anticoagulation for DVT.

## 2012-10-23 ENCOUNTER — Other Ambulatory Visit: Payer: Self-pay | Admitting: *Deleted

## 2012-10-23 MED ORDER — WARFARIN SODIUM 5 MG PO TABS: 12.5000 mg | ORAL_TABLET | ORAL | Status: DC

## 2012-10-23 NOTE — Telephone Encounter (Signed)
Last visit with Dr Elie Confer 10/13.  Pt requested refill on coumadin but does not look like this was done.   Please refill, pt is out of med.

## 2012-10-28 ENCOUNTER — Ambulatory Visit: Payer: Self-pay

## 2012-11-01 NOTE — Addendum Note (Signed)
Addended by: Hulan Fray on: 11/01/2012 06:55 AM   Modules accepted: Orders

## 2012-11-12 ENCOUNTER — Encounter: Payer: Self-pay | Admitting: Internal Medicine

## 2012-11-14 ENCOUNTER — Other Ambulatory Visit: Payer: Self-pay

## 2012-12-24 ENCOUNTER — Ambulatory Visit (INDEPENDENT_AMBULATORY_CARE_PROVIDER_SITE_OTHER): Payer: Self-pay | Admitting: Internal Medicine

## 2012-12-24 ENCOUNTER — Encounter: Payer: Self-pay | Admitting: Internal Medicine

## 2012-12-24 VITALS — BP 158/89 | HR 98 | Temp 98.0°F | Wt 371.7 lb

## 2012-12-24 DIAGNOSIS — I798 Other disorders of arteries, arterioles and capillaries in diseases classified elsewhere: Secondary | ICD-10-CM

## 2012-12-24 DIAGNOSIS — R600 Localized edema: Secondary | ICD-10-CM | POA: Insufficient documentation

## 2012-12-24 DIAGNOSIS — I10 Essential (primary) hypertension: Secondary | ICD-10-CM

## 2012-12-24 DIAGNOSIS — E1051 Type 1 diabetes mellitus with diabetic peripheral angiopathy without gangrene: Secondary | ICD-10-CM

## 2012-12-24 DIAGNOSIS — E1059 Type 1 diabetes mellitus with other circulatory complications: Secondary | ICD-10-CM

## 2012-12-24 DIAGNOSIS — R609 Edema, unspecified: Secondary | ICD-10-CM

## 2012-12-24 DIAGNOSIS — Z23 Encounter for immunization: Secondary | ICD-10-CM

## 2012-12-24 LAB — POCT GLYCOSYLATED HEMOGLOBIN (HGB A1C): Hemoglobin A1C: 8.6

## 2012-12-24 LAB — GLUCOSE, CAPILLARY: Glucose-Capillary: 142 mg/dL — ABNORMAL HIGH (ref 70–99)

## 2012-12-24 MED ORDER — LISINOPRIL-HYDROCHLOROTHIAZIDE 20-25 MG PO TABS: 1.0000 | ORAL_TABLET | Freq: Every day | ORAL | Status: DC

## 2012-12-24 MED ORDER — INSULIN NPH ISOPHANE & REGULAR (70-30) 100 UNIT/ML ~~LOC~~ SUSP: 50.0000 [IU] | Freq: Two times a day (BID) | SUBCUTANEOUS | Status: DC

## 2012-12-24 MED ORDER — FUROSEMIDE 40 MG PO TABS: 40.0000 mg | ORAL_TABLET | Freq: Every day | ORAL | Status: DC | PRN

## 2012-12-24 NOTE — Progress Notes (Signed)
Subjective:   Patient ID: [name] male   DOB: 1975-10-26 37 y.o.   MRN: [id number]  HPI: [name] is a 37 y.o. man with pmhx of uncontrolled DM who comes for follow regarding his DM. See a and P charting.    Past Medical History  Diagnosis Date  . Diabetes mellitus   . DVT (deep venous thrombosis) 09/2002    patient reports additional DVTs in '06 & '11 (unconfirmed)  . History of hypertension     resolved after weight loss  . Pulmonary embolism 09/2002    treated with 6 months of warfarin   Current Outpatient Prescriptions  Medication Sig Dispense Refill  . HYDROcodone-acetaminophen (NORCO/VICODIN) 5-325 MG per tablet Take 1-2 pills every 4-6 hours as needed for pain.  6 tablet  0  . insulin NPH-regular (NOVOLIN 70/30) (70-30) 100 UNIT/ML injection Inject 40-45 Units into the skin 2 (two) times daily with a meal. Inject 45 units in the morning and 40 units in the evening      . lisinopril-hydrochlorothiazide (PRINZIDE,ZESTORETIC) 20-25 MG per tablet Take 1 tablet by mouth daily.      . metFORMIN (GLUCOPHAGE) 500 MG tablet Take 2 tablets (1,000 mg total) by mouth 2 (two) times daily with a meal.  120 tablet  6  . warfarin (COUMADIN) 5 MG tablet Take 2.5 tablets (12.5 mg total) by mouth as directed. Take 12.5 mg (2.5 tab) Mon, Wed, Fri, Sunday.  Take 7.5 mg (1.5 tab) Tues, Thurs, Sat  60 tablet  3   No current facility-administered medications for this visit.   Family History  Problem Relation Age of Onset  . Heart disease    . Diabetes    . Obesity     History   Social History  . Marital Status: Single    Spouse Name: N/A    Number of Children: N/A  . Years of Education: 15.5   Occupational History  . [employer]   . student   .     Social History Main Topics  . Smoking status: Never Smoker   . Smokeless tobacco: None  . Alcohol Use: Yes     Comment: occasional, q3w 2 beers  . Drug Use: No  . Sexual Activity: None   Other Topics Concern  . None    Social History Narrative   Financial assistance approved for 100% discount at [name] and has [facility] card per [employer]   09/01/2009.      Lives in [place] with mother and sister.               Review of Systems:  Review of Systems  Constitutional: Negative for fever, weight loss, malaise/fatigue and diaphoresis.  HENT: Negative for sore throat.   Eyes: Negative for blurred vision and double vision.  Respiratory: Negative for cough, hemoptysis, sputum production and shortness of breath.   Cardiovascular: Positive for leg swelling. Negative for chest pain and palpitations.  Gastrointestinal: Negative for heartburn, nausea, vomiting, abdominal pain, diarrhea and constipation.  Neurological: Negative for headaches.     Objective:  Physical Exam: Filed Vitals:   12/24/12 1410  BP: 158/89  Pulse: 98  Temp: 98 F (36.7 C)  TempSrc: Oral  Weight: 371 lb 11.2 oz (168.602 kg)  SpO2: 95%   Physical Exam  Constitutional: He appears well-developed and well-nourished. No distress.  Morbidly obese man  HENT:  Head: Normocephalic.  Mouth/Throat: Oropharynx is clear and moist. No oropharyngeal exudate.  Eyes: EOM are normal. Pupils are  equal, round, and reactive to light.  Neck: Normal range of motion. Neck supple.  Cardiovascular: Normal rate, regular rhythm, normal heart sounds and intact distal pulses.  Exam reveals no gallop and no friction rub.   No murmur heard. Pulmonary/Chest: Effort normal and breath sounds normal. No respiratory distress. He has no wheezes. He has no rales. He exhibits no tenderness.  Abdominal: Soft. Bowel sounds are normal. He exhibits no distension. There is no tenderness. There is no rebound and no guarding.  Musculoskeletal:  bil LE edema 3+  Skin: He is not diaphoretic.  Psychiatric: He has a normal mood and affect. His behavior is normal.     Assessment & Plan:

## 2012-12-24 NOTE — Patient Instructions (Addendum)
Today, I have checked some labs. I will call you with the results.  Please take insulin 50 U in the morning and 30 U in the evening.   Please restart your lisinopril-HCTZ.  Please take lasix 40 mg daily as needed for leg swelling.

## 2012-12-25 ENCOUNTER — Telehealth: Payer: Self-pay | Admitting: Internal Medicine

## 2012-12-25 LAB — BASIC METABOLIC PANEL WITH GFR
Chloride: 104 mEq/L (ref 96–112)
GFR, Est Non African American: >89 mL/min
Potassium: 4.4 mEq/L (ref 3.5–5.3)
Sodium: 140 mEq/L (ref 135–145)

## 2012-12-25 NOTE — Assessment & Plan Note (Signed)
Patient has noticed worsening LE swelling over the last 2 months. He is using stockings to minimize the swelling. He has previously using lasix with success.  I plan to give the patient 40 mg lasix qd as needed for LE swelling. The patient is in agreement with this plan.

## 2012-12-25 NOTE — Assessment & Plan Note (Signed)
Lab Results  Component Value Date   HGBA1C 8.6 12/24/2012   HGBA1C 12.0 10/02/2012   HGBA1C 8.2 02/09/2012    The patient has made large changes in his lifestyle and has increased exercise while d/c soda and white bread. I congratulated him on this success.  Assessment: Diabetes control: fair control Progress toward A1C goal:  improved Comments: Much imrpoved.  Plan: Medications:  Increase morning NPH to 50 and decreased nightitme NPH to 30. Home glucose monitoring: Frequency: 3 times a day Timing: before breakfast;before lunch;before dinner Instruction/counseling given: reminded to bring blood glucose meter & log to each visit Educational resources provided: brochure Self management tools provided:   Other plans: Patient is requesting help with weight loss. Wants to start Belviq> we decided to continue working on DM , diet and exercise, which is is completing. We will discuss Belviq at next visit.

## 2012-12-25 NOTE — Assessment & Plan Note (Signed)
BP Readings from Last 3 Encounters:  12/24/12 158/89  10/02/12 128/69  10/02/12 135/86    Lab Results  Component Value Date   NA 140 12/24/2012   K 4.4 12/24/2012   CREATININE 0.93 12/24/2012    Assessment: Blood pressure control: mildly elevated Progress toward BP goal:  unchanged Comments: Ran out of his Lisinopril-HCTZ last week. Mildly elevated today.   Plan: Medications:  Gave new prescription for lisinopril-HCTZ. aslo started prn lasix for LE swelling. Educational resources provided: brochure Self management tools provided:   Other plans: Recheck at next visit.

## 2012-12-25 NOTE — Telephone Encounter (Signed)
Informed patient that Blood work was normal.

## 2012-12-26 MED ORDER — INSULIN NPH ISOPHANE & REGULAR (70-30) 100 UNIT/ML ~~LOC~~ SUSP: SUBCUTANEOUS | Status: DC

## 2012-12-26 NOTE — Addendum Note (Signed)
Addended by: Marrion Coy B on: 12/26/2012 03:28 PM   Modules accepted: Orders

## 2012-12-26 NOTE — Progress Notes (Signed)
I saw and evaluated the patient.  I personally confirmed the key portions of the history and exam documented by Dr. Komanski and I reviewed pertinent patient test results.  The assessment, diagnosis, and plan were formulated together and I agree with the documentation in the resident's note.  

## 2013-02-18 ENCOUNTER — Inpatient Hospital Stay (HOSPITAL_COMMUNITY)
Admission: EM | Admit: 2013-02-18 | Discharge: 2013-02-19 | DRG: 287 | Disposition: A | Discharge status: Home / Self Care | Payer: BC Managed Care – PPO | Source: Ambulatory Visit | Attending: Cardiology | Admitting: Cardiology

## 2013-02-18 ENCOUNTER — Encounter (HOSPITAL_COMMUNITY)
Admission: EM | Disposition: A | Discharge status: Home / Self Care | Payer: Self-pay | Source: Ambulatory Visit | Attending: Cardiology

## 2013-02-18 DIAGNOSIS — N183 Chronic kidney disease, stage 3 unspecified: Secondary | ICD-10-CM

## 2013-02-18 DIAGNOSIS — I10 Essential (primary) hypertension: Secondary | ICD-10-CM

## 2013-02-18 DIAGNOSIS — E1065 Type 1 diabetes mellitus with hyperglycemia: Secondary | ICD-10-CM

## 2013-02-18 DIAGNOSIS — R079 Chest pain, unspecified: Secondary | ICD-10-CM

## 2013-02-18 DIAGNOSIS — R609 Edema, unspecified: Secondary | ICD-10-CM

## 2013-02-18 DIAGNOSIS — Z7901 Long term (current) use of anticoagulants: Secondary | ICD-10-CM

## 2013-02-18 DIAGNOSIS — E1051 Type 1 diabetes mellitus with diabetic peripheral angiopathy without gangrene: Secondary | ICD-10-CM

## 2013-02-18 DIAGNOSIS — E119 Type 2 diabetes mellitus without complications: Secondary | ICD-10-CM | POA: Diagnosis present

## 2013-02-18 DIAGNOSIS — Z9889 Other specified postprocedural states: Secondary | ICD-10-CM

## 2013-02-18 DIAGNOSIS — E785 Hyperlipidemia, unspecified: Secondary | ICD-10-CM

## 2013-02-18 DIAGNOSIS — IMO0002 Reserved for concepts with insufficient information to code with codable children: Secondary | ICD-10-CM

## 2013-02-18 DIAGNOSIS — I129 Hypertensive chronic kidney disease with stage 1 through stage 4 chronic kidney disease, or unspecified chronic kidney disease: Secondary | ICD-10-CM | POA: Diagnosis present

## 2013-02-18 DIAGNOSIS — N179 Acute kidney failure, unspecified: Secondary | ICD-10-CM | POA: Diagnosis present

## 2013-02-18 DIAGNOSIS — I824Y9 Acute embolism and thrombosis of unspecified deep veins of unspecified proximal lower extremity: Secondary | ICD-10-CM

## 2013-02-18 DIAGNOSIS — N184 Chronic kidney disease, stage 4 (severe): Secondary | ICD-10-CM | POA: Diagnosis present

## 2013-02-18 DIAGNOSIS — E1059 Type 1 diabetes mellitus with other circulatory complications: Secondary | ICD-10-CM | POA: Diagnosis present

## 2013-02-18 DIAGNOSIS — I798 Other disorders of arteries, arterioles and capillaries in diseases classified elsewhere: Secondary | ICD-10-CM | POA: Diagnosis present

## 2013-02-18 DIAGNOSIS — Z86711 Personal history of pulmonary embolism: Secondary | ICD-10-CM

## 2013-02-18 DIAGNOSIS — Z794 Long term (current) use of insulin: Secondary | ICD-10-CM

## 2013-02-18 DIAGNOSIS — R0789 Other chest pain: Principal | ICD-10-CM | POA: Diagnosis present

## 2013-02-18 DIAGNOSIS — R600 Localized edema: Secondary | ICD-10-CM

## 2013-02-18 DIAGNOSIS — Z86718 Personal history of other venous thrombosis and embolism: Secondary | ICD-10-CM

## 2013-02-18 DIAGNOSIS — Z79899 Other long term (current) drug therapy: Secondary | ICD-10-CM

## 2013-02-18 HISTORY — DX: Chronic kidney disease, stage 3 (moderate): N18.3

## 2013-02-18 HISTORY — PX: CARDIAC CATHETERIZATION: SHX172

## 2013-02-18 HISTORY — DX: Chest pain, unspecified: R07.9

## 2013-02-18 HISTORY — DX: Type 1 diabetes mellitus without complications: E10.9

## 2013-02-18 HISTORY — DX: Essential (primary) hypertension: I10

## 2013-02-18 HISTORY — DX: Hyperlipidemia, unspecified: E78.5

## 2013-02-18 HISTORY — PX: LEFT HEART CATHETERIZATION WITH CORONARY ANGIOGRAM: SHX5451

## 2013-02-18 LAB — POCT I-STAT, CHEM 8
BUN: 28 mg/dL — ABNORMAL HIGH (ref 6–23)
CALCIUM ION: 1.26 mmol/L — AB (ref 1.12–1.23)
CREATININE: 1.7 mg/dL — AB (ref 0.50–1.35)
Chloride: 99 mEq/L (ref 96–112)
GLUCOSE: 223 mg/dL — AB (ref 70–99)
HCT: 42 % (ref 39.0–52.0)
HEMOGLOBIN: 14.3 g/dL (ref 13.0–17.0)
POTASSIUM: 3.8 meq/L (ref 3.7–5.3)
Sodium: 138 mEq/L (ref 137–147)
TCO2: 23 mmol/L (ref 0–100)

## 2013-02-18 LAB — COMPREHENSIVE METABOLIC PANEL
ALK PHOS: 88 U/L (ref 39–117)
ALT: 27 U/L (ref 0–53)
AST: 28 U/L (ref 0–37)
Albumin: 2.8 g/dL — ABNORMAL LOW (ref 3.5–5.2)
BUN: 31 mg/dL — AB (ref 6–23)
CHLORIDE: 95 meq/L — AB (ref 96–112)
CO2: 25 mEq/L (ref 19–32)
Calcium: 9.1 mg/dL (ref 8.4–10.5)
Creatinine, Ser: 1.5 mg/dL — ABNORMAL HIGH (ref 0.50–1.35)
GFR, EST AFRICAN AMERICAN: 68 mL/min — AB (ref >90)
GFR, EST NON AFRICAN AMERICAN: 58 mL/min — AB (ref >90)
GLUCOSE: 308 mg/dL — AB (ref 70–99)
Potassium: 4.8 mEq/L (ref 3.7–5.3)
SODIUM: 134 meq/L — AB (ref 137–147)
Total Protein: 7 g/dL (ref 6.0–8.3)

## 2013-02-18 LAB — CBC WITH DIFFERENTIAL/PLATELET
Basophils Absolute: 0 10*3/uL (ref 0.0–0.1)
Basophils Relative: 0 % (ref 0–1)
Eosinophils Absolute: 0.2 10*3/uL (ref 0.0–0.7)
Eosinophils Relative: 1 % (ref 0–5)
HEMATOCRIT: 37.7 % — AB (ref 39.0–52.0)
HEMOGLOBIN: 13.6 g/dL (ref 13.0–17.0)
Lymphocytes Relative: 18 % (ref 12–46)
Lymphs Abs: 2 10*3/uL (ref 0.7–4.0)
MCH: 29.4 pg (ref 26.0–34.0)
MCHC: 36.1 g/dL — AB (ref 30.0–36.0)
MCV: 81.6 fL (ref 78.0–100.0)
MONO ABS: 0.7 10*3/uL (ref 0.1–1.0)
MONOS PCT: 6 % (ref 3–12)
Neutro Abs: 8.2 10*3/uL — ABNORMAL HIGH (ref 1.7–7.7)
Neutrophils Relative %: 74 % (ref 43–77)
Platelets: 266 10*3/uL (ref 150–400)
RBC: 4.62 MIL/uL (ref 4.22–5.81)
RDW: 13 % (ref 11.5–15.5)
WBC: 11.1 10*3/uL — ABNORMAL HIGH (ref 4.0–10.5)

## 2013-02-18 LAB — TROPONIN I

## 2013-02-18 LAB — MAGNESIUM: MAGNESIUM: 2.4 mg/dL (ref 1.5–2.5)

## 2013-02-18 LAB — PROTIME-INR
INR: 3.04 — AB (ref 0.00–1.49)
PROTHROMBIN TIME: 30.4 s — AB (ref 11.6–15.2)

## 2013-02-18 LAB — GLUCOSE, CAPILLARY
GLUCOSE-CAPILLARY: 208 mg/dL — AB (ref 70–99)
GLUCOSE-CAPILLARY: 299 mg/dL — AB (ref 70–99)

## 2013-02-18 SURGERY — LEFT HEART CATHETERIZATION WITH CORONARY ANGIOGRAM
Anesthesia: LOCAL

## 2013-02-18 MED ORDER — INSULIN ASPART 100 UNIT/ML ~~LOC~~ SOLN
0.0000 [IU] | Freq: Three times a day (TID) | SUBCUTANEOUS | Status: DC
Start: 2013-02-19 — End: 2013-02-19
  Administered 2013-02-19: 11 [IU] via SUBCUTANEOUS
  Administered 2013-02-19: 4 [IU] via SUBCUTANEOUS

## 2013-02-18 MED ORDER — ACETAMINOPHEN 325 MG PO TABS: 650.0000 mg | ORAL_TABLET | ORAL | Status: DC | PRN

## 2013-02-18 MED ORDER — PANTOPRAZOLE SODIUM 40 MG PO TBEC
40.0000 mg | DELAYED_RELEASE_TABLET | Freq: Every day | ORAL | Status: DC
  Administered 2013-02-18 – 2013-02-19 (×2): 40 mg via ORAL
  Filled 2013-02-18 (×2): qty 1

## 2013-02-18 MED ORDER — NITROGLYCERIN 0.2 MG/ML ON CALL CATH LAB
INTRAVENOUS | Status: AC
  Filled 2013-02-18: qty 1

## 2013-02-18 MED ORDER — HEPARIN (PORCINE) IN NACL 2-0.9 UNIT/ML-% IJ SOLN
INTRAMUSCULAR | Status: AC
  Filled 2013-02-18: qty 1000

## 2013-02-18 MED ORDER — LIDOCAINE HCL (PF) 1 % IJ SOLN
INTRAMUSCULAR | Status: AC
  Filled 2013-02-18: qty 30

## 2013-02-18 MED ORDER — ACETAMINOPHEN 325 MG PO TABS
650.0000 mg | ORAL_TABLET | ORAL | Status: DC | PRN
  Administered 2013-02-18: 650 mg via ORAL
  Filled 2013-02-18: qty 2

## 2013-02-18 MED ORDER — ONDANSETRON HCL 4 MG/2ML IJ SOLN: 4.0000 mg | Freq: Four times a day (QID) | INTRAMUSCULAR | Status: DC | PRN

## 2013-02-18 MED ORDER — ATORVASTATIN CALCIUM 10 MG PO TABS
10.0000 mg | ORAL_TABLET | Freq: Every day | ORAL | Status: DC
  Administered 2013-02-18: 10 mg via ORAL
  Filled 2013-02-18 (×2): qty 1

## 2013-02-18 MED ORDER — INSULIN ASPART PROT & ASPART (70-30 MIX) 100 UNIT/ML ~~LOC~~ SUSP
50.0000 [IU] | Freq: Every day | SUBCUTANEOUS | Status: DC
  Administered 2013-02-19: 50 [IU] via SUBCUTANEOUS
  Filled 2013-02-18: qty 10

## 2013-02-18 MED ORDER — NITROGLYCERIN 0.4 MG SL SUBL: 0.4000 mg | SUBLINGUAL_TABLET | SUBLINGUAL | Status: DC | PRN

## 2013-02-18 MED ORDER — MIDAZOLAM HCL 2 MG/2ML IJ SOLN
INTRAMUSCULAR | Status: AC
  Filled 2013-02-18: qty 2

## 2013-02-18 MED ORDER — SODIUM CHLORIDE 0.9 % IV SOLN: INTRAVENOUS | Status: AC

## 2013-02-18 MED ORDER — FENTANYL CITRATE 0.05 MG/ML IJ SOLN
INTRAMUSCULAR | Status: AC
  Filled 2013-02-18: qty 2

## 2013-02-18 MED ORDER — HEPARIN SODIUM (PORCINE) 1000 UNIT/ML IJ SOLN
INTRAMUSCULAR | Status: AC
  Filled 2013-02-18: qty 1

## 2013-02-18 MED ORDER — VERAPAMIL HCL 2.5 MG/ML IV SOLN
INTRAVENOUS | Status: AC
  Filled 2013-02-18: qty 2

## 2013-02-18 NOTE — CV Procedure (Signed)
    Cardiac Catheterization Procedure Note  Name: [name] MRN: [id number] DOB: 01-04-1976  Procedure: Left Heart Cath, Selective Coronary Angiography  Indication: 38 yo BM with history of DM, HTN, and recurrent DVT on coumadin presents with acute onset of dizziness, diaphoresis, chest pain and nausea with vomiting. Ecg shows ST elevation in leads V2-3 that is increased compared to baseline.    Procedural Details: The right wrist was prepped, draped, and anesthetized with 1% lidocaine. Using the modified Seldinger technique, a 5 French sheath was introduced into the right radial artery. 3 mg of verapamil was administered through the sheath, weight-based unfractionated heparin was administered intravenously. Standard Judkins catheters were used for selective coronary angiography. Coronary engagement was difficult due to short aortic root. The left coronary artery was engaged with difficulty with an EZRAD left catheter. Catheter exchanges were performed over an exchange length guidewire. There were no immediate procedural complications. A TR band was used for radial hemostasis at the completion of the procedure.  The patient was transferred to the post catheterization recovery area for further monitoring.  Procedural Findings: Hemodynamics: AO 111/76 mean 88 mm Hg LV 111/13 mm Hg  Coronary angiography: Coronary dominance: right  Left mainstem: Normal.  Left anterior descending (LAD): Normal  Ramus intermediate: large vessel. Normal.  Left circumflex (LCx): Large, normal.  Right coronary artery (RCA): Normal. There is a large atrial branch with plexus of vessels.  Left ventriculography: Not done due to elevated creatinine.  Final Conclusions:   1. Normal coronary anatomy  Recommendations: Evaluate for noncardiac causes of symptoms.  Collier Salina [facility] 02/18/2013, 5:34 PM

## 2013-02-18 NOTE — H&P (Signed)
Physician History and Physical    Patient ID: [name] MRN: [id number] DOB/AGE: December 10, 1975 38 y.o. Admit date: 02/18/2013  Primary Care Physician: Marrion Coy MD Primary Cardiologist None  HPI: 38 yo BM with history of DM, obesity, HTN and recurrent DVTs on chronic coumadin presents with acute chest pain today. The patient reports he hadn't eaten anything today. He went to [employer] and ate a burger and some Gatorade. As he got up to leave he suddenly felt weak and very dizzy. He became diaphoretic and then developed nausea and vomiting. He then developed mid sternal chest pain and dyspnea. EMS obtained an Ecg which showed accentuation of baseline ST elevation in leads V2-3. Given concern that this was a STEMI emergent cardiac cath was recommended.   Review of systems complete and found to be negative unless listed above  Past Medical History  Diagnosis Date  . Diabetes mellitus   . DVT (deep venous thrombosis) 09/2002    patient reports additional DVTs in '06 & '11 (unconfirmed)  . History of hypertension     resolved after weight loss  . Pulmonary embolism 09/2002    treated with 6 months of warfarin    Family History  Problem Relation Age of Onset  . Heart disease    . Diabetes    . Obesity      History   Social History  . Marital Status: Single    Spouse Name: N/A    Number of Children: N/A  . Years of Education: 15.5   Occupational History  . [employer]   . student   .     Social History Main Topics  . Smoking status: Never Smoker   . Smokeless tobacco: Not on file  . Alcohol Use: Yes     Comment: occasional, q3w 2 beers  . Drug Use: No  . Sexual Activity: Not on file   Other Topics Concern  . Not on file   Social History Narrative   Financial assistance approved for 100% discount at [facility] and has [facility] card per [employer]   09/01/2009.      Lives in [place] with mother and sister.                Past Surgical History  Procedure  Laterality Date  . Back abscess i&d  2007     Prescriptions prior to admission  Medication Sig Dispense Refill  . furosemide (LASIX) 40 MG tablet Take 1 tablet (40 mg total) by mouth daily as needed.  30 tablet  11  . insulin NPH-regular (NOVOLIN 70/30) (70-30) 100 UNIT/ML injection Inject 50 units in the morning and 30 units in the evening  10 mL  2  . lisinopril-hydrochlorothiazide (PRINZIDE,ZESTORETIC) 20-25 MG per tablet Take 1 tablet by mouth daily.  30 tablet  6  . metFORMIN (GLUCOPHAGE) 500 MG tablet Take 2 tablets (1,000 mg total) by mouth 2 (two) times daily with a meal.  120 tablet  6  . warfarin (COUMADIN) 5 MG tablet Take 2.5 tablets (12.5 mg total) by mouth as directed. Take 12.5 mg (2.5 tab) Mon, Wed, Fri, Sunday.  Take 7.5 mg (1.5 tab) Tues, Thurs, Sat  60 tablet  3    Physical Exam: Pulse 94. BP 111/76 He is a morbidly obese BM in NAD.  HEENT: [place]/AT, PERRL, EOMI. Sclera are clear.oropharynx is clear. Neck: thick, no obvious JVD. No bruits. No adenopathy. Lungs. Clear anteriorly CV: RRR normal S1-2, no gallop or murmur. Abdomen: morbidly obese. Soft. Nontender.  Normal BS. Ext: pulses 2+. Chronic 2+ edema Neuro: alert and oriented x 3. CN II-XII intact. Anxious.  Labs:   Lab Results  Component Value Date   WBC 6.4 02/12/2012   HGB 14.4 02/12/2012   HCT 42.0 02/12/2012   MCV 84.3 02/12/2012   PLT 286 02/12/2012   No results found for this basename: NA, K, CL, CO2, BUN, CREATININE, CALCIUM, LABALBU, PROT, BILITOT, ALKPHOS, ALT, AST, GLUCOSE,  in the last 168 hours Lab Results  Component Value Date   TROPONINI <0.30 02/10/2012    Lab Results  Component Value Date   CHOL 157 02/19/2012   CHOL 154 12/28/2008   Lab Results  Component Value Date   HDL 52 02/19/2012   HDL 43 12/28/2008   Lab Results  Component Value Date   LDLCALC 74 02/19/2012   LDLCALC 96 12/28/2008   Lab Results  Component Value Date   TRIG 153* 02/19/2012   TRIG 74 12/28/2008   Lab Results    Component Value Date   CHOLHDL 3.0 02/19/2012   CHOLHDL 3.6 Ratio 12/28/2008   No results found for this basename: LDLDIRECT      Radiology: pending EKG: NSR LVH, ST elevation V2-3.  ASSESSMENT AND PLAN:  1. Acute chest pain. Nausea and vomiting. Etiology unclear. Emergent cardiac cath shows no significant CAD. Will admit. Cycle enzymes. Check Echo to assess LV function. GI treatment with PPI. Check chemistries, CBC. 2. Acute kidney failure. istat creatinine 1.7 which is new. Hold diuretics and ACEi. Check chemistries. Hydrate post cath and hold metformin. Repeat in am. 3. DM on insulin. Continue am 70/30. Add SSI. 4. Morbid obesity. 5. HTN.  6. History of recurrent DVT and prior PE. INR pending. On chronic coumadin.  SignedCollier Salina [facility] 02/18/2013, 5:49 PM

## 2013-02-19 ENCOUNTER — Encounter (HOSPITAL_COMMUNITY): Payer: Self-pay | Admitting: General Practice

## 2013-02-19 ENCOUNTER — Inpatient Hospital Stay (HOSPITAL_COMMUNITY): Payer: BC Managed Care – PPO

## 2013-02-19 DIAGNOSIS — I824Y9 Acute embolism and thrombosis of unspecified deep veins of unspecified proximal lower extremity: Secondary | ICD-10-CM

## 2013-02-19 DIAGNOSIS — N183 Chronic kidney disease, stage 3 unspecified: Secondary | ICD-10-CM

## 2013-02-19 DIAGNOSIS — E785 Hyperlipidemia, unspecified: Secondary | ICD-10-CM

## 2013-02-19 DIAGNOSIS — N184 Chronic kidney disease, stage 4 (severe): Secondary | ICD-10-CM

## 2013-02-19 DIAGNOSIS — Z9889 Other specified postprocedural states: Secondary | ICD-10-CM

## 2013-02-19 HISTORY — DX: Chronic kidney disease, stage 4 (severe): N18.4

## 2013-02-19 HISTORY — DX: Hyperlipidemia, unspecified: E78.5

## 2013-02-19 HISTORY — DX: Chronic kidney disease, stage 3 unspecified: N18.30

## 2013-02-19 LAB — TROPONIN I
Troponin I: <0.3 ng/mL (ref <0.30)
Troponin I: <0.3 ng/mL (ref <0.30)

## 2013-02-19 LAB — BASIC METABOLIC PANEL
BUN: 32 mg/dL — AB (ref 6–23)
CHLORIDE: 95 meq/L — AB (ref 96–112)
CO2: 25 mEq/L (ref 19–32)
CREATININE: 1.59 mg/dL — AB (ref 0.50–1.35)
Calcium: 8.6 mg/dL (ref 8.4–10.5)
GFR calc non Af Amer: 54 mL/min — ABNORMAL LOW (ref >90)
GFR, EST AFRICAN AMERICAN: 63 mL/min — AB (ref >90)
Glucose, Bld: 307 mg/dL — ABNORMAL HIGH (ref 70–99)
Potassium: 4.1 mEq/L (ref 3.7–5.3)
Sodium: 134 mEq/L — ABNORMAL LOW (ref 137–147)

## 2013-02-19 LAB — LIPID PANEL
CHOL/HDL RATIO: 5.7 ratio
CHOLESTEROL: 258 mg/dL — AB (ref 0–200)
HDL: 45 mg/dL (ref >39)
LDL Cholesterol: 147 mg/dL — ABNORMAL HIGH (ref 0–99)
Triglycerides: 329 mg/dL — ABNORMAL HIGH (ref <150)
VLDL: 66 mg/dL — ABNORMAL HIGH (ref 0–40)

## 2013-02-19 LAB — HEMOGLOBIN A1C
Hgb A1c MFr Bld: 10.4 % — ABNORMAL HIGH (ref <5.7)
Mean Plasma Glucose: 252 mg/dL — ABNORMAL HIGH (ref <117)

## 2013-02-19 LAB — PROTIME-INR
INR: 3.2 — ABNORMAL HIGH (ref 0.00–1.49)
Prothrombin Time: 31.6 seconds — ABNORMAL HIGH (ref 11.6–15.2)

## 2013-02-19 LAB — CBC
HEMATOCRIT: 38.7 % — AB (ref 39.0–52.0)
Hemoglobin: 14 g/dL (ref 13.0–17.0)
MCH: 29.5 pg (ref 26.0–34.0)
MCHC: 36.2 g/dL — AB (ref 30.0–36.0)
MCV: 81.6 fL (ref 78.0–100.0)
Platelets: 298 10*3/uL (ref 150–400)
RBC: 4.74 MIL/uL (ref 4.22–5.81)
RDW: 13 % (ref 11.5–15.5)
WBC: 11.1 10*3/uL — AB (ref 4.0–10.5)

## 2013-02-19 LAB — GLUCOSE, CAPILLARY
GLUCOSE-CAPILLARY: 254 mg/dL — AB (ref 70–99)
GLUCOSE-CAPILLARY: 167 mg/dL — AB (ref 70–99)
Glucose-Capillary: 264 mg/dL — ABNORMAL HIGH (ref 70–99)

## 2013-02-19 LAB — TSH: TSH: 0.945 u[IU]/mL (ref 0.350–4.500)

## 2013-02-19 MED ORDER — LISINOPRIL-HYDROCHLOROTHIAZIDE 20-25 MG PO TABS: 1.0000 | ORAL_TABLET | Freq: Every day | ORAL | Status: DC

## 2013-02-19 MED ORDER — ATORVASTATIN CALCIUM 10 MG PO TABS: 10.0000 mg | ORAL_TABLET | Freq: Every day | ORAL | Status: DC

## 2013-02-19 MED ORDER — METFORMIN HCL 500 MG PO TABS: 1000.0000 mg | ORAL_TABLET | Freq: Two times a day (BID) | ORAL | Status: DC

## 2013-02-19 MED ORDER — FUROSEMIDE 40 MG PO TABS: 40.0000 mg | ORAL_TABLET | Freq: Every day | ORAL | Status: DC

## 2013-02-19 MED ORDER — INSULIN ASPART PROT & ASPART (70-30 MIX) 100 UNIT/ML ~~LOC~~ SUSP: 30.0000 [IU] | Freq: Every day | SUBCUTANEOUS | Status: DC

## 2013-02-19 MED ORDER — PANTOPRAZOLE SODIUM 40 MG PO TBEC: 40.0000 mg | DELAYED_RELEASE_TABLET | Freq: Every day | ORAL | Status: DC

## 2013-02-19 NOTE — Discharge Instructions (Signed)
Call [facility] [phone] if any bleeding, swelling or drainage at cath site.  May shower, no tub baths for 48 hours for groin sticks.  No lifting over 5 pounds for 3 days. No driving for 3 days.  Heart Healthy diabetic diet.  We added Protonix in case this was reflux causing you to have pain then lightheaded.  Eat your meals slowly and decrease fatty foods.    Follow up with your Primary Care MD for your diabetes.  We added Lipitor for cholesterol.  Yours is elevated,  Your PCP will follow this.  Do not take Metformin until the 13 th of Feb. It may interact with the cath dye.   Hold your Lasix until the 12th. Also your lisinopril/Hctz. Hold your coumadin today and resume tomorrow have it checked tomorrow or Friday.

## 2013-02-19 NOTE — Discharge Summary (Signed)
Physician Discharge Summary       Patient ID: [name] MRN: [id number] DOB/AGE: March 23, 1975 38 y.o.  Admit date: 02/18/2013 Discharge date: 02/19/2013  Discharge Diagnoses:  Principal Problem:   Chest pain, neg MI, normal coronaries by cath Active Problems:   DM (diabetes mellitus) type I uncontrolled, periph vascular disorder   Morbid obesity   DVT, HX OF   Long term (current) use of anticoagulants   HTN (hypertension)   Hyperlipidemia   S/P cardiac cath, 02/18/13, normal coronaries   CKD (chronic kidney disease) stage 3, GFR 30-59 ml/min   Discharged Condition: good  Procedures: 02/18/13 cardiac cath by Dr. Martinique.  Hospital Course: 38 yo BM with history of DM, obesity, HTN and recurrent DVTs on chronic coumadin presented with acute chest pain 02/18/13. The patient reported he hadn't eaten anything on day of admit. He went to [employer] and ate a burger and some Gatorade. As he got up to leave he suddenly felt weak and very dizzy. He became diaphoretic and then developed nausea and vomiting. He then developed mid sternal chest pain and dyspnea. EMS obtained an Ecg which showed accentuation of baseline ST elevation in leads V2-3. Given concern that this was a STEMI emergent cardiac cath was recommended.  Cardiac cath revealed normal coronary arteries.  LV function was not assessed to elevated Cr.  Pt did well post cath and by 02/19/13 was stable without complaints.  He was seen and evaluated by Dr. Harrington Challenger and found stable for discharge.  Weight management was discussed with pt as well.  He will follow up with His PCP.    Consults: None  Significant Diagnostic Studies:  BMET    Component Value Date/Time   NA 134* 02/19/2013 0035   K 4.1 02/19/2013 0035   CL 95* 02/19/2013 0035   CO2 25 02/19/2013 0035   GLUCOSE 307* 02/19/2013 0035   BUN 32* 02/19/2013 0035   CREATININE 1.59* 02/19/2013 0035   CREATININE 0.93 12/24/2012 1545   CALCIUM 8.6 02/19/2013 0035   GFRNONAA 54* 02/19/2013  0035   GFRAA 63* 02/19/2013 0035    CBC    Component Value Date/Time   WBC 11.1* 02/19/2013 0035   RBC 4.74 02/19/2013 0035   HGB 14.0 02/19/2013 0035   HCT 38.7* 02/19/2013 0035   PLT 298 02/19/2013 0035   MCV 81.6 02/19/2013 0035   MCH 29.5 02/19/2013 0035   MCHC 36.2* 02/19/2013 0035   RDW 13.0 02/19/2013 0035   LYMPHSABS 2.0 02/18/2013 2007   MONOABS 0.7 02/18/2013 2007   EOSABS 0.2 02/18/2013 2007   BASOSABS 0.0 02/18/2013 2007    Troponin <0.30 X 2 HgBA1C 10.4 INR at discharge 3.20   T Chol 258 TG 329 HDL 45 LDL 147  CXR: EXAM: CHEST 2 VIEW  COMPARISON: February 09, 2012.  FINDINGS: The heart size and mediastinal contours are within normal limits. Both lungs are clear. No pleural effusion or pneumothorax is noted. The visualized skeletal structures are unremarkable.  IMPRESSION: No active cardiopulmonary disease.    Discharge Exam: Blood pressure 135/78, pulse 83, temperature 98.2 F (36.8 C), temperature source Oral, resp. rate 18, SpO2 97.00%.   Disposition: 01-Home or Self Care       Future Appointments Provider Department Dept Phone   03/25/2013 3:45 PM Marrion Coy, MD [place] [phone]       Medication List    TAKE these medications       atorvastatin 10 MG tablet  Commonly known as:  LIPITOR  Take 1 tablet (10 mg total) by mouth daily at 6 PM.     furosemide 40 MG tablet  Commonly known as:  LASIX  Take 1 tablet (40 mg total) by mouth daily.  Start taking on:  02/20/2013     insulin NPH-regular Human (70-30) 100 UNIT/ML injection  Commonly known as:  NOVOLIN 70/30  Inject 35-50 Units into the skin 2 (two) times daily with a meal. 50 units in the morning and 35 units in the evening     metFORMIN 500 MG tablet  Commonly known as:  GLUCOPHAGE  Take 2 tablets (1,000 mg total) by mouth 2 (two) times daily with a meal.  Start taking on:  02/21/2013     pantoprazole 40 MG tablet  Commonly known as:  PROTONIX   Take 1 tablet (40 mg total) by mouth daily.     warfarin 5 MG tablet  Commonly known as:  COUMADIN  Take 7.5-12.5 mg by mouth daily. Take 2.5 tablets (12.5 mg) on Mon,wed,fri and sun. Take 1.5 tablets (7.5 mg) on tues,thurs and sat      ASK your doctor about these medications       lisinopril-hydrochlorothiazide 20-25 MG per tablet  Commonly known as:  PRINZIDE,ZESTORETIC  Take 1 tablet by mouth daily.       Follow-up Information   Follow up with Marrion Coy, MD. (keep aptt.)    Specialty:  Internal Medicine   Contact information:   [place] [address] [phone]        Discharge Instructions:  Call [facility] [phone] if any bleeding, swelling or drainage at cath site.  May shower, no tub baths for 48 hours for groin sticks.  No lifting over 5 pounds for 3 days. No driving for 3 days.  Heart Healthy diabetic diet.  We added Protonix in case this was reflux causing you to have pain then lightheaded.  Eat your meals slowly and decrease fatty foods.    Follow up with your Primary Care MD for your diabetes.  We added Lipitor for cholesterol.  Yours is elevated,  Your PCP will follow this. Do not take Metformin until the 13 th of Feb. It may interact with the cath dye.   Hold your Lasix until the 12th.   Also your lisinopril/Hctz Hold your coumadin today and resume tomorrow have it checked tomorrow or Friday.  Signed: Isaiah Serge Nurse Practitioner-Certified [facility]: [facility] 02/19/2013, 1:15 PM  Time spent on discharge :35 minutes.

## 2013-02-19 NOTE — Progress Notes (Signed)
UR Completed Jermar Colter Graves-Bigelow, RN,BSN [phone]  

## 2013-02-19 NOTE — Progress Notes (Signed)
Pt provided with dc instructions and education. Pt verbalized understanding. Pt understands how and when to take all medications. IV removed with tip intact. Heart monitor cleaned and returned to front. Dorna Bloom, RN

## 2013-02-19 NOTE — Progress Notes (Addendum)
Inpatient Diabetes Program Recommendations  AACE/ADA: New Consensus Statement on Inpatient Glycemic Control (2013)  Target Ranges:  Prepandial:   less than 140 mg/dL      Peak postprandial:   less than 180 mg/dL (1-2 hours)      Critically ill patients:  140 - 180 mg/dL     Results for TARELL, MCNICHOL (MRN [id number]) as of 02/19/2013 09:07  Ref. Range 02/19/2013 00:03 02/19/2013 07:35  Glucose-Capillary Latest Range: 70-99 mg/dL 264 (H) 254 (H)    Results for TYKELL, LINEBACK (MRN [id number]) as of 02/19/2013 09:07  Ref. Range 02/18/2013 20:07  Hemoglobin A1C Latest Range: <5.7 % 10.4 (H)    Diabetes history: Type 2 DM Outpatient Diabetes medications: 70/30 insulin- 50 units AM and 30 units with supper + Metformin 1000 bid Current orders for Inpatient glycemic control: 70/30 insulin- 50 units QAM + Novolog Resistant SSI tid   **Note that patient has PCP (Dr. Margart Sickles) in the Internal Medicine Teaching clinic.  Last saw Dr. Margart Sickles on 12/24/12.  S/P cardiac cath yesterday evening.  **Patient currently only has order for 70/30 insulin 50 units QAM.  Patient takes 70/30 insulin at supper as well (30 units).   **MD- Please start 70/30 insulin- 30 units with supper as well   Addendum 1220pm: Spoke with patient about his elevated A1c of 10.4%.  Patient told me Dr. Margart Sickles increased his 70/30 insulin to the present doses back in December.  Hs a follow up appt with Dr. Margart Sickles on 03/25/13.  Patient did not have any questions for me at this time.  Will follow.   Will follow. Wyn Quaker RN, MSN, CDE Diabetes Coordinator Inpatient Diabetes Program Team Pager: [phone] (8a-10p)

## 2013-02-19 NOTE — Discharge Summary (Signed)
Patient seen  Agree with plans.

## 2013-02-19 NOTE — Progress Notes (Signed)
Subjective: NO CP  No dizziness.  No SOB   Objective: Filed Vitals:   02/18/13 2008 02/18/13 2038 02/18/13 2124 02/19/13 0624  BP: 144/82 156/74 143/80 135/78  Pulse: 79 82 77 83  Temp:   97.8 F (36.6 C) 98.2 F (36.8 C)  TempSrc:   Oral Oral  Resp:   18 18  SpO2: 99% 100% 97% 97%   Weight change:   Intake/Output Summary (Last 24 hours) at 02/19/13 1249 Last data filed at 02/19/13 0005  Gross per 24 hour  Intake      0 ml  Output    500 ml  Net   -500 ml    General: Alert, awake, oriented x3, in no acute distress Neck:  JVP is normal Heart: Regular rate and rhythm, without murmurs, rubs, gallops.  Lungs: Clear to auscultation.  No rales or wheezes. Exemities:  No edema.   Neuro: Grossly intact, nonfocal.   Lab Results: Results for orders placed during the hospital encounter of 02/18/13 (from the past 24 hour(s))  POCT I-STAT, CHEM 8     Status: Abnormal   Collection Time    02/18/13  5:11 PM      Result Value Ref Range   Sodium 138  137 - 147 mEq/L   Potassium 3.8  3.7 - 5.3 mEq/L   Chloride 99  96 - 112 mEq/L   BUN 28 (*) 6 - 23 mg/dL   Creatinine, Ser 1.70 (*) 0.50 - 1.35 mg/dL   Glucose, Bld 223 (*) 70 - 99 mg/dL   Calcium, Ion 1.26 (*) 1.12 - 1.23 mmol/L   TCO2 23  0 - 100 mmol/L   Hemoglobin 14.3  13.0 - 17.0 g/dL   HCT 42.0  39.0 - 52.0 %  GLUCOSE, CAPILLARY     Status: Abnormal   Collection Time    02/18/13  6:07 PM      Result Value Ref Range   Glucose-Capillary 208 (*) 70 - 99 mg/dL  TROPONIN I     Status: None   Collection Time    02/18/13  8:07 PM      Result Value Ref Range   Troponin I <0.30  <0.30 ng/mL  PROTIME-INR     Status: Abnormal   Collection Time    02/18/13  8:07 PM      Result Value Ref Range   Prothrombin Time 30.4 (*) 11.6 - 15.2 seconds   INR 3.04 (*) 0.00 - 1.49  CBC WITH DIFFERENTIAL     Status: Abnormal   Collection Time    02/18/13  8:07 PM      Result Value Ref Range   WBC 11.1 (*) 4.0 - 10.5 K/uL   RBC 4.62  4.22 -  5.81 MIL/uL   Hemoglobin 13.6  13.0 - 17.0 g/dL   HCT 37.7 (*) 39.0 - 52.0 %   MCV 81.6  78.0 - 100.0 fL   MCH 29.4  26.0 - 34.0 pg   MCHC 36.1 (*) 30.0 - 36.0 g/dL   RDW 13.0  11.5 - 15.5 %   Platelets 266  150 - 400 K/uL   Neutrophils Relative % 74  43 - 77 %   Neutro Abs 8.2 (*) 1.7 - 7.7 K/uL   Lymphocytes Relative 18  12 - 46 %   Lymphs Abs 2.0  0.7 - 4.0 K/uL   Monocytes Relative 6  3 - 12 %   Monocytes Absolute 0.7  0.1 - 1.0 K/uL   Eosinophils Relative  1  0 - 5 %   Eosinophils Absolute 0.2  0.0 - 0.7 K/uL   Basophils Relative 0  0 - 1 %   Basophils Absolute 0.0  0.0 - 0.1 K/uL  TSH     Status: None   Collection Time    02/18/13  8:07 PM      Result Value Ref Range   TSH 0.945  0.350 - 4.500 uIU/mL  COMPREHENSIVE METABOLIC PANEL     Status: Abnormal   Collection Time    02/18/13  8:07 PM      Result Value Ref Range   Sodium 134 (*) 137 - 147 mEq/L   Potassium 4.8  3.7 - 5.3 mEq/L   Chloride 95 (*) 96 - 112 mEq/L   CO2 25  19 - 32 mEq/L   Glucose, Bld 308 (*) 70 - 99 mg/dL   BUN 31 (*) 6 - 23 mg/dL   Creatinine, Ser 1.50 (*) 0.50 - 1.35 mg/dL   Calcium 9.1  8.4 - 10.5 mg/dL   Total Protein 7.0  6.0 - 8.3 g/dL   Albumin 2.8 (*) 3.5 - 5.2 g/dL   AST 28  0 - 37 U/L   ALT 27  0 - 53 U/L   Alkaline Phosphatase 88  39 - 117 U/L   Total Bilirubin <0.2 (*) 0.3 - 1.2 mg/dL   GFR calc non Af Amer 58 (*) >90 mL/min   GFR calc Af Amer 68 (*) >90 mL/min  MAGNESIUM     Status: None   Collection Time    02/18/13  8:07 PM      Result Value Ref Range   Magnesium 2.4  1.5 - 2.5 mg/dL  HEMOGLOBIN A1C     Status: Abnormal   Collection Time    02/18/13  8:07 PM      Result Value Ref Range   Hemoglobin A1C 10.4 (*) <5.7 %   Mean Plasma Glucose 252 (*) <117 mg/dL  GLUCOSE, CAPILLARY     Status: Abnormal   Collection Time    02/18/13  9:40 PM      Result Value Ref Range   Glucose-Capillary 299 (*) 70 - 99 mg/dL   Comment 1 Notify RN    GLUCOSE, CAPILLARY     Status: Abnormal    Collection Time    02/19/13 12:03 AM      Result Value Ref Range   Glucose-Capillary 264 (*) 70 - 99 mg/dL   Comment 1 Notify RN    TROPONIN I     Status: None   Collection Time    02/19/13 12:34 AM      Result Value Ref Range   Troponin I <0.30  <0.30 ng/mL  CBC     Status: Abnormal   Collection Time    02/19/13 12:35 AM      Result Value Ref Range   WBC 11.1 (*) 4.0 - 10.5 K/uL   RBC 4.74  4.22 - 5.81 MIL/uL   Hemoglobin 14.0  13.0 - 17.0 g/dL   HCT 38.7 (*) 39.0 - 52.0 %   MCV 81.6  78.0 - 100.0 fL   MCH 29.5  26.0 - 34.0 pg   MCHC 36.2 (*) 30.0 - 36.0 g/dL   RDW 13.0  11.5 - 15.5 %   Platelets 298  150 - 400 K/uL  BASIC METABOLIC PANEL     Status: Abnormal   Collection Time    02/19/13 12:35 AM      Result  Value Ref Range   Sodium 134 (*) 137 - 147 mEq/L   Potassium 4.1  3.7 - 5.3 mEq/L   Chloride 95 (*) 96 - 112 mEq/L   CO2 25  19 - 32 mEq/L   Glucose, Bld 307 (*) 70 - 99 mg/dL   BUN 32 (*) 6 - 23 mg/dL   Creatinine, Ser 1.59 (*) 0.50 - 1.35 mg/dL   Calcium 8.6  8.4 - 10.5 mg/dL   GFR calc non Af Amer 54 (*) >90 mL/min   GFR calc Af Amer 63 (*) >90 mL/min  PROTIME-INR     Status: Abnormal   Collection Time    02/19/13 12:35 AM      Result Value Ref Range   Prothrombin Time 31.6 (*) 11.6 - 15.2 seconds   INR 3.20 (*) 0.00 - 1.49  LIPID PANEL     Status: Abnormal   Collection Time    02/19/13 12:35 AM      Result Value Ref Range   Cholesterol 258 (*) 0 - 200 mg/dL   Triglycerides 329 (*) <150 mg/dL   HDL 45  >39 mg/dL   Total CHOL/HDL Ratio 5.7     VLDL 66 (*) 0 - 40 mg/dL   LDL Cholesterol 147 (*) 0 - 99 mg/dL  TROPONIN I     Status: None   Collection Time    02/19/13  6:35 AM      Result Value Ref Range   Troponin I <0.30  <0.30 ng/mL  GLUCOSE, CAPILLARY     Status: Abnormal   Collection Time    02/19/13  7:35 AM      Result Value Ref Range   Glucose-Capillary 254 (*) 70 - 99 mg/dL  GLUCOSE, CAPILLARY     Status: Abnormal   Collection Time     02/19/13 11:40 AM      Result Value Ref Range   Glucose-Capillary 167 (*) 70 - 99 mg/dL    Studies/Results: @RISRSLT24 @  Medications: revewed   @PROBHOSP @  1.  CP   I am not sure what lead to patient's spell yesterday  Was feeling dizzy  Ate at [employer]  Then felt very bad.  Dizzy  CP   Cath yesterday showed no CAD.  ? If bp low and then had vagal event. His EKG has some baseline changes  Not completely normal  I would recomm f/u of BP as outpatient.  Patient is going to buy cuff I encouraged him t ostay active.  Drink fluids to keep urine light and for thirst.  He will f/u with primary MD  I agree with additon of lipitor  He had been on it in early 2000s  With dx of DM need to be aggressive  Counselled on wt loss.  F/U cardiology prn.    LOS: 1 day   Dorris Carnes 02/19/2013, 12:49 PM

## 2013-03-25 ENCOUNTER — Encounter: Payer: Self-pay | Admitting: Internal Medicine

## 2013-03-31 ENCOUNTER — Ambulatory Visit (INDEPENDENT_AMBULATORY_CARE_PROVIDER_SITE_OTHER): Payer: Self-pay | Admitting: Pharmacist

## 2013-03-31 ENCOUNTER — Encounter: Payer: Self-pay | Admitting: *Deleted

## 2013-03-31 ENCOUNTER — Other Ambulatory Visit: Payer: Self-pay | Admitting: Internal Medicine

## 2013-03-31 ENCOUNTER — Ambulatory Visit (HOSPITAL_COMMUNITY)
Admission: RE | Admit: 2013-03-31 | Discharge: 2013-03-31 | Disposition: A | Discharge status: Home / Self Care | Payer: Self-pay | Source: Ambulatory Visit | Attending: Internal Medicine | Admitting: Internal Medicine

## 2013-03-31 DIAGNOSIS — L0291 Cutaneous abscess, unspecified: Secondary | ICD-10-CM

## 2013-03-31 DIAGNOSIS — L0390 Cellulitis, unspecified: Secondary | ICD-10-CM

## 2013-03-31 DIAGNOSIS — I824Y9 Acute embolism and thrombosis of unspecified deep veins of unspecified proximal lower extremity: Secondary | ICD-10-CM

## 2013-03-31 DIAGNOSIS — M545 Low back pain, unspecified: Secondary | ICD-10-CM | POA: Insufficient documentation

## 2013-03-31 DIAGNOSIS — Z7901 Long term (current) use of anticoagulants: Secondary | ICD-10-CM

## 2013-03-31 DIAGNOSIS — Z86718 Personal history of other venous thrombosis and embolism: Secondary | ICD-10-CM

## 2013-03-31 LAB — POCT INR: INR: 1

## 2013-03-31 MED ORDER — ACETAMINOPHEN-CODEINE #3 300-30 MG PO TABS: 1.0000 | ORAL_TABLET | Freq: Three times a day (TID) | ORAL | Status: DC | PRN

## 2013-03-31 MED ORDER — WARFARIN SODIUM 5 MG PO TABS: ORAL_TABLET | ORAL | Status: DC

## 2013-03-31 MED ORDER — CLINDAMYCIN HCL 300 MG PO CAPS: 300.0000 mg | ORAL_CAPSULE | Freq: Three times a day (TID) | ORAL | Status: DC

## 2013-03-31 NOTE — Progress Notes (Signed)
Pt given Rx for Tylenol #3 Sent to Radiology and will return to clinic after complete. Scheduled for INR on Thursday and scheduled for OV tomorrow AM  Working phone # [phone]

## 2013-03-31 NOTE — Progress Notes (Signed)
Pt walked into clinic today with c/o ? Cyst to lower back area.  Onset 1 1/2 weeks ago. Area drained some yesterday.  Very painful to pressure.  Hurt's with BM's  Asked Dr Eppie Gibson to see pt. Scheduled for appointment tomorrow AM

## 2013-03-31 NOTE — Progress Notes (Signed)
Anti-Coagulation Progress Note  [name] is a 38 y.o. male who is currently on an anti-coagulation regimen.    RECENT RESULTS: Recent results are below, the most recent result is correlated with a dose of 72.5 mg. per week: Patient being placed upon clindamycin for infection today in [place]. Will re-evaluate INR response on Thursday. I am at [facility] lecturing from 0900-1100h. Please call result to my pager or cell phone and I will react to his value.  Lab Results  Component Value Date   INR 1.00 03/31/2013   INR 3.20* 02/19/2013   INR 3.04* 02/18/2013    ANTI-COAG DOSE: Anticoagulation Dose Instructions as of 03/31/2013     [name] Tue Wed Thu Fri Sat   New Dose 12.5 mg 12.5 mg 7.5 mg 12.5 mg 7.5 mg 12.5 mg 7.5 mg       ANTICOAG SUMMARY: Anticoagulation Episode Summary   Current INR goal 2.0-3.0  Next INR check 04/03/2013  INR from last check 1.00! (03/31/2013)  Weekly max dose   Target end date   INR check location [facility]  Preferred lab   Send INR reminders to    Indications  DVT HX OF [V12.51] Acute venous embolism and thrombosis of deep vessels of proximal lower extremity [453.41] Long term (current) use of anticoagulants [V58.61]        Comments         ANTICOAG TODAY: Anticoagulation Summary as of 03/31/2013   INR goal 2.0-3.0  Selected INR 1.00! (03/31/2013)  Next INR check 04/03/2013  Target end date    Indications  DVT HX OF [V12.51] Acute venous embolism and thrombosis of deep vessels of proximal lower extremity [453.41] Long term (current) use of anticoagulants [V58.61]      Anticoagulation Episode Summary   INR check location [facility]   Preferred lab    Send INR reminders to    Comments       PATIENT INSTRUCTIONS: Patient Instructions  Patient instructed to take medications as defined in the Anti-coagulation Track section of this encounter.  Patient instructed to take today's dose.  Patient verbalized understanding of  these instructions.       FOLLOW-UP Return in 3 days (on 04/03/2013) for Follow up INR at 0900h.  Jorene Guest, III Pharm.D., CACP

## 2013-03-31 NOTE — Progress Notes (Signed)
Indication: Recurrent venous thromboembolism.  Duration: Lifelong.  INR: Below target.  Agree with Dr. Gladstone Pih assessment and plan.

## 2013-03-31 NOTE — Patient Instructions (Signed)
Patient instructed to take medications as defined in the Anti-coagulation Track section of this encounter.  Patient instructed to take today's dose.  Patient verbalized understanding of these instructions.    

## 2013-03-31 NOTE — Progress Notes (Signed)
[name] was seen in Anticoagulation Clinic today and noted back pain.  He was therefore referred to the [facility].  There are no openings at this time and he was given an appointment for tomorrow morning for a more extensive evaluation.  Briefly, he has a history of presumed MRSA abscesses X 2.  Several days ago, he developed an abscess over his lower spine.  It has progressively worsened since then and was "popped" yesterday with some drainage.  He states the pain has worsened since then.  He denies any fevers, shakes, chills, weakness, numbness, or change in bowel and bladder function.  Abbreviated examination reveals a WDWN man standing in an office in NAD.  Over the lumbar spine there is an area of mild induration and erythema about the size of a 50 cent piece.  It is tender to palpation.  There is no obvious abscess cavity at this time after the spontaneous drainage yesterday.  LE strength is grossly intact.  Assessment  Likely recurrent MRSA abscess, this time over the LS spine.  No obvious abscess to I&D but overlying cellulitis after it "popped" yesterday.  Plan  1) Clinidamycin 300 mg PO Q8H X 7 days.  He has an appointment to check INR on Thursday in the anticoagulation clinic.  2) Tylenol #3 1-2 tablets PO Q8H PRN severe pain (disp #30).  3) LS spine X-ray to look for air in the soft tissues given the pain out of proportion to what is seen on examination.  4) A formal and more extensive evaluation in the AM.  Has an appointment in the [facility] at 9:45.

## 2013-04-01 ENCOUNTER — Ambulatory Visit (INDEPENDENT_AMBULATORY_CARE_PROVIDER_SITE_OTHER): Payer: Self-pay | Admitting: Internal Medicine

## 2013-04-01 ENCOUNTER — Encounter: Payer: Self-pay | Admitting: Internal Medicine

## 2013-04-01 VITALS — BP 135/76 | HR 111 | Temp 100.7°F | Ht 73.0 in | Wt 346.3 lb

## 2013-04-01 DIAGNOSIS — I798 Other disorders of arteries, arterioles and capillaries in diseases classified elsewhere: Secondary | ICD-10-CM

## 2013-04-01 DIAGNOSIS — L02219 Cutaneous abscess of trunk, unspecified: Secondary | ICD-10-CM

## 2013-04-01 DIAGNOSIS — L0390 Cellulitis, unspecified: Secondary | ICD-10-CM | POA: Insufficient documentation

## 2013-04-01 DIAGNOSIS — E1059 Type 1 diabetes mellitus with other circulatory complications: Secondary | ICD-10-CM

## 2013-04-01 DIAGNOSIS — E1065 Type 1 diabetes mellitus with hyperglycemia: Secondary | ICD-10-CM

## 2013-04-01 DIAGNOSIS — IMO0002 Reserved for concepts with insufficient information to code with codable children: Secondary | ICD-10-CM

## 2013-04-01 DIAGNOSIS — L03319 Cellulitis of trunk, unspecified: Secondary | ICD-10-CM

## 2013-04-01 DIAGNOSIS — E1051 Type 1 diabetes mellitus with diabetic peripheral angiopathy without gangrene: Secondary | ICD-10-CM

## 2013-04-01 LAB — GLUCOSE, CAPILLARY: Glucose-Capillary: 240 mg/dL — ABNORMAL HIGH (ref 70–99)

## 2013-04-01 MED ORDER — SULFAMETHOXAZOLE-TMP DS 800-160 MG PO TABS: 1.0000 | ORAL_TABLET | Freq: Two times a day (BID) | ORAL | Status: DC

## 2013-04-01 NOTE — Assessment & Plan Note (Signed)
Hx of T1DM but likely also has insulin resistance.  -Continue Metformin 1g BID - Increase Insulin 70/30 by 10% to 55 in AM and 33 in PM. Instructed if low readings reduce to previous.  Follow up in 1 week.

## 2013-04-01 NOTE — Assessment & Plan Note (Signed)
No clear abscess for drainage.  - Change Abx to Bactrim DS BID x 7 days for affordability.  INR currently 1, unlikely to become supratheraputic but will have INR monitored closely given Bactrim. -Continue Tylenol #3 for pain. - F/U in 5-7 days.

## 2013-04-01 NOTE — Progress Notes (Signed)
[facility] Subjective:   Patient ID: [name] male   DOB: March 22, 1975 38 y.o.   MRN: [id number]  HPI: [name] is a 38 y.o. male with a PMH significant for uncontrolled T1DM, CKD2-3, Venous Thromboembolism (on chronic Warfarin), presumed MRSA abscesses X2. Patient presented to our clinic triage yesterday afternoon for an abscess over his lower spine that "popped" no appts were available yesterday but he was briefly seen by Dr. Eppie Gibson who prescribed Tylenol #3 for pain and Clindamycin 300mg  TID x 7 days. And was given 1st available appointment (today).  Patient reports he was able to obtain the Tylenol #3 but that the clindamycin was too expensive.  He reports that tylenol #3 has helped the pain a little but that he was unable to sleep last night secondary to pain.  He also notes that he was only taken 1 pill of the tylenol so far.  He reports the pain is currently a 10/10, and that he felt a "little feverish" this morning.  For his diabetes he has been taking Novolog 70/30 50 units in AM and 30 units in PM, has been checking his BS twice a day with most readings ~300.  He denies blurry vision, polydipsia, or polyuria.   Past Medical History  Diagnosis Date  . DVT (deep venous thrombosis) 09/2002    patient reports additional DVTs in '06 & '11 (unconfirmed)  . Pulmonary embolism 09/2002    treated with 6 months of warfarin  . Hypertension   . Type I diabetes mellitus 2001  . Chest pain, neg MI, normal coronaries by cath 02/18/2013  . Hyperlipidemia 02/19/2013  . S/P cardiac cath, 02/18/13, normal coronaries 02/19/2013  . CKD (chronic kidney disease) stage 3, GFR 30-59 ml/min 02/19/2013   Current Outpatient Prescriptions  Medication Sig Dispense Refill  . acetaminophen-codeine (TYLENOL #3) 300-30 MG per tablet Take 1-2 tablets by mouth every 8 (eight) hours as needed for severe pain.  30 tablet  0  . atorvastatin (LIPITOR) 10 MG tablet Take 1 tablet (10 mg  total) by mouth daily at 6 PM.  30 tablet  6  . clindamycin (CLEOCIN) 300 MG capsule Take 1 capsule (300 mg total) by mouth 3 (three) times daily.  63 capsule  0  . furosemide (LASIX) 40 MG tablet Take 1 tablet (40 mg total) by mouth daily.  30 tablet    . insulin NPH-regular Human (NOVOLIN 70/30) (70-30) 100 UNIT/ML injection Inject 35-50 Units into the skin 2 (two) times daily with a meal. 50 units in the morning and 35 units in the evening      . lisinopril-hydrochlorothiazide (PRINZIDE,ZESTORETIC) 20-25 MG per tablet Take 1 tablet by mouth daily.  30 tablet  6  . metFORMIN (GLUCOPHAGE) 500 MG tablet Take 2 tablets (1,000 mg total) by mouth 2 (two) times daily with a meal.  120 tablet  6  . pantoprazole (PROTONIX) 40 MG tablet Take 1 tablet (40 mg total) by mouth daily.  30 tablet  2  . warfarin (COUMADIN) 5 MG tablet Take 2.5 tablets (12.5 mg) all days of week EXCEPT on Tues/Fri--take only 1&1/2 tablets.  75 tablet  1   No current facility-administered medications for this visit.   Family History  Problem Relation Age of Onset  . Heart disease    . Diabetes    . Obesity     History   Social History  . Marital Status: Single    Spouse Name: N/A    Number  of Children: N/A  . Years of Education: 15.5   Occupational History  . [employer]   . student   .     Social History Main Topics  . Smoking status: Never Smoker   . Smokeless tobacco: Never Used  . Alcohol Use: Yes     Comment: 02/18/2013 "a few beers q 6 months or so"  . Drug Use: No  . Sexual Activity: Yes   Other Topics Concern  . Not on file   Social History Narrative   Financial assistance approved for 100% discount at [facility] and has [facility] card per [employer]   09/01/2009.      Lives in [place] with mother and sister.               Review of Systems: Review of Systems  Constitutional: Positive for malaise/fatigue. Negative for fever and chills.  Eyes: Negative for blurred vision.  Respiratory: Negative for  shortness of breath.   Cardiovascular: Negative for chest pain and leg swelling.  Gastrointestinal: Negative for nausea, vomiting and abdominal pain.  Musculoskeletal: Positive for back pain.  Skin: Negative for itching.  Endo/Heme/Allergies: Negative for polydipsia.     Objective:  Physical Exam: Filed Vitals:   04/01/13 0954  BP: 135/76  Pulse: 111  Temp: 100.7 F (38.2 C)  TempSrc: Oral  Weight: 346 lb 4.8 oz (157.081 kg)  SpO2: 96%  Physical Exam  Nursing note and vitals reviewed. Constitutional: He is well-developed, well-nourished, and in no distress.  HENT:  Head: Normocephalic and atraumatic.  Cardiovascular: Regular rhythm, normal heart sounds and intact distal pulses.   Tachycardic  Pulmonary/Chest: Effort normal and breath sounds normal.  Skin:       Assessment & Plan:  Case discussed with Dr. Eppie Gibson  See Problem Based Assessment and Plan Medications Ordered Meds ordered this encounter  Medications  . sulfamethoxazole-trimethoprim (BACTRIM DS) 800-160 MG per tablet    Sig: Take 1 tablet by mouth 2 (two) times daily.    Dispense:  14 tablet    Refill:  0   Other Orders Orders Placed This Encounter  Procedures  . Glucose, capillary

## 2013-04-01 NOTE — Progress Notes (Signed)
I saw and evaluated the patient.  I personally confirmed the key portions of Dr. Hoffman's history and exam and reviewed pertinent patient test results.  The assessment, diagnosis, and plan were formulated together and I agree with the documentation in the resident's note. 

## 2013-04-01 NOTE — Patient Instructions (Signed)
Please take the Antibiotic twice a day for 7 days. You can increase your 70/30 insulin to 55 units in AM and 33 units in PM It is important to make it to your appointments with Dr. Elie Confer  Please bring your medicines with you each time you come.   Medicines may be  Eye drops  Herbal   Vitamins  Pills  Seeing these help [place] take care of you.

## 2013-04-02 ENCOUNTER — Telehealth: Payer: Self-pay | Admitting: *Deleted

## 2013-04-02 NOTE — Telephone Encounter (Signed)
Pt called asking why he never had blood work done.  He was seen in clinic on 3/24 for cellulitis.  Treated with antibiotics.  He is concerned with having a MRSA infection. Pt # [id number]

## 2013-04-02 NOTE — Telephone Encounter (Signed)
Called patient.  He reports that his cellulitis continues to drain and is still painful.  He has take so far 3 doses of antibiotic.  His AM blood sugar today was 240 (after his increased PM dose of 33 units of 70/30.) I reassured him that Bactrim does have activity for MRSA and no blood test is needed without other symptoms.   I instructed him to continue the Bactrim and increased insulin dose and to call if he has repeated sugars >300 or sugars <70, also if he develops a fever.  -Joni Reining, DO.

## 2013-04-03 ENCOUNTER — Other Ambulatory Visit: Payer: Self-pay

## 2013-04-08 ENCOUNTER — Ambulatory Visit (INDEPENDENT_AMBULATORY_CARE_PROVIDER_SITE_OTHER): Payer: Self-pay | Admitting: Internal Medicine

## 2013-04-08 ENCOUNTER — Ambulatory Visit: Payer: Self-pay | Admitting: Internal Medicine

## 2013-04-08 ENCOUNTER — Encounter: Payer: Self-pay | Admitting: Internal Medicine

## 2013-04-08 VITALS — BP 108/69 | HR 73

## 2013-04-08 VITALS — BP 101/54 | HR 93

## 2013-04-08 DIAGNOSIS — I951 Orthostatic hypotension: Secondary | ICD-10-CM

## 2013-04-08 DIAGNOSIS — L03319 Cellulitis of trunk, unspecified: Secondary | ICD-10-CM

## 2013-04-08 DIAGNOSIS — L0390 Cellulitis, unspecified: Secondary | ICD-10-CM

## 2013-04-08 DIAGNOSIS — IMO0002 Reserved for concepts with insufficient information to code with codable children: Secondary | ICD-10-CM

## 2013-04-08 DIAGNOSIS — I798 Other disorders of arteries, arterioles and capillaries in diseases classified elsewhere: Secondary | ICD-10-CM

## 2013-04-08 DIAGNOSIS — I10 Essential (primary) hypertension: Secondary | ICD-10-CM

## 2013-04-08 DIAGNOSIS — E1065 Type 1 diabetes mellitus with hyperglycemia: Secondary | ICD-10-CM

## 2013-04-08 DIAGNOSIS — Z7901 Long term (current) use of anticoagulants: Secondary | ICD-10-CM

## 2013-04-08 DIAGNOSIS — L02219 Cutaneous abscess of trunk, unspecified: Secondary | ICD-10-CM

## 2013-04-08 DIAGNOSIS — E1051 Type 1 diabetes mellitus with diabetic peripheral angiopathy without gangrene: Secondary | ICD-10-CM

## 2013-04-08 DIAGNOSIS — E1059 Type 1 diabetes mellitus with other circulatory complications: Secondary | ICD-10-CM

## 2013-04-08 LAB — GLUCOSE, CAPILLARY
Glucose-Capillary: 133 mg/dL — ABNORMAL HIGH (ref 70–99)
Glucose-Capillary: 108 mg/dL — ABNORMAL HIGH (ref 70–99)

## 2013-04-08 LAB — POCT INR: INR: 6.2

## 2013-04-08 MED ORDER — SODIUM CHLORIDE 0.9 % IV SOLN: Freq: Once | INTRAVENOUS | Status: DC

## 2013-04-08 MED ORDER — PROMETHAZINE HCL 25 MG PO TABS: 12.5000 mg | ORAL_TABLET | Freq: Four times a day (QID) | ORAL | Status: DC | PRN

## 2013-04-08 MED ORDER — OXYCODONE-ACETAMINOPHEN 5-325 MG PO TABS: 1.0000 | ORAL_TABLET | ORAL | Status: DC | PRN

## 2013-04-08 NOTE — Patient Instructions (Signed)
Drink plenty of water for the next few days. Continue your current insulin dose, if low sugars return to lower dose of 50 units in AM and 30 units in PM. Continue hot compresses. Take percocet as prescribed for pain. Hold Coumadin for 3 days then restart your normal dose. Make sure to come to your appointment on Monday with Dr. Elie Confer Call our clinic for new symptoms or if not feeling better next week.

## 2013-04-08 NOTE — Assessment & Plan Note (Addendum)
Patient has had poor PO intake of water and on diuretic medication. His symptoms are consistent with othostatic hypotension.  I do not suspect DKA given good BS control, no abdominal pain. - Give 1L NS in clinic  Update: Pt S/p IVF bolus, feels much better, able to stand and walk around.   Repeat orthostatic vital signs wnl.

## 2013-04-08 NOTE — Addendum Note (Signed)
Addended by: Lucious Groves on: 04/08/2013 08:23 PM   Modules accepted: Orders

## 2013-04-08 NOTE — Patient Instructions (Signed)
Please continue to drink plenty of water.

## 2013-04-08 NOTE — Assessment & Plan Note (Signed)
Patient mildly hypotensive and likely experiencing orthostatic hypotension, this is likely secondary to poor PO intake.  He take a combination bill for blood pressure.  Instructed patient to continue BP medications as he [place] usually well controlled and make conscience effort to increase H2O consumption for the next few days. -Pt to follow up with PCP.

## 2013-04-08 NOTE — Progress Notes (Signed)
[name] S: [name] is a 38 yo male with PMH of T1DM, HTN, and cellulitius. He was seen earlier in our clinic today for 1 week follow up after treatment for cellulitis. At that visit he complained of dizziness and found to have low normal BP.  He was instructed to increase H2O consumption.  He reports he as able to drink a bottle of water and went to fill his percocet Rx.  At the drug store he felt very dizzy and sweaty and needed to sit down.  He returned to the clinic without his Rx due to filling unwell.  He reports when standing he gets very dizzy and the only thing that helps is laying down.  He does admit nausea and has vomited once since arrival to clinic. O:  VSL BP: 115/66 T: 97.9 O2: 100%, P: 94. CBG: 108 Physical Exam  Nursing note and vitals reviewed. Constitutional:  Morbid obesity, laying on exam table  HENT:  Head: Normocephalic and atraumatic.  Cardiovascular: Normal rate, regular rhythm and normal heart sounds.   Pulmonary/Chest: Effort normal and breath sounds normal.  Abdominal: Soft. Bowel sounds are normal. There is no tenderness.   A: Orthostatic hypotension due to dehydration  P:  1L NS bolus. Check Orthostatic vital signs after.

## 2013-04-08 NOTE — Assessment & Plan Note (Signed)
Patient missed follow up with Dr. Elie Confer, is supratheraputic today due to Bactrim. No signs/ symptoms of bleeding.  Instructed patient to hold warfarin for 3 days then resume regular dose he has appointment for follow up and INR recheck with Dr. Elie Confer in 6 days.

## 2013-04-08 NOTE — Progress Notes (Signed)
I saw and evaluated the patient.  I personally confirmed the key portions of Dr. Jodene Nam history and exam and reviewed pertinent patient test results.  The assessment, diagnosis, and plan were formulated together and I agree with the documentation in the resident's note.  The back lesion remains indurated but is no longer tender, erythematous, or warm.  No further antibiotic therapy is necessary given the clinical improvement.

## 2013-04-08 NOTE — Assessment & Plan Note (Signed)
Improved blood glucose control with 55units of 70/30 in am and 33units in PM, encouraged patient to continue this regimen, instructed to decease back to 50 units in AM and 33 units in PM if he has any hypoglycemic readings.

## 2013-04-08 NOTE — Progress Notes (Signed)
[facility] Subjective:   Patient ID: [name] male   DOB: [birth year] 38 y.o.   MRN: [id number]  HPI: [name] is a 38 y.o. male with a PMH significant for uncontrolled T1DM, CKD2-3, Venous Thromboembolism (on chronic Warfarin), presumed MRSA abscesses X2. He was seen in our clinic on 3/24 with cellulitis of the back.  He was prescribed clindamycin but could not afford and this was changed to bactrim for affordability. A follow up INR was scheduled for 3 days later (INR was 1.0 on 03/31/13) but he did not make it to this visit. He completed his last dose of Bactrim yesterday, he reports the swelling as decreased but still has some pain in the area.  He reports the Tylenol #3 is not working and needs something stronger. For this diabetes he has been taking 55 units of 70/30 in AM and 33 units in PM.   He reports improvement in his blood sugars in 120-140s in AM, and 140-160 in PM. Today he is reporting some dizziness and blurred vision.  He reports he is needing to use a magnifying glass to read newspaper.  This started Sunday.  He also reports some mild photophobia and a mild bitemporal headache.  He rates his HA as a 4/10.  The tylenol #3 did not help the headache. He reports his dizziness is worse when he stands up to walk or going up stairs.  He reports drinking less water lately and and darker urine.   Past Medical History  Diagnosis Date  . DVT (deep venous thrombosis) 09/2002    patient reports additional DVTs in '06 & '11 (unconfirmed)  . Pulmonary embolism 09/2002    treated with 6 months of warfarin  . Hypertension   . Type I diabetes mellitus 2001  . Chest pain, neg MI, normal coronaries by cath 02/18/2013  . Hyperlipidemia 02/19/2013  . S/P cardiac cath, 02/18/13, normal coronaries 02/19/2013  . CKD (chronic kidney disease) stage 3, GFR 30-59 ml/min 02/19/2013   Current Outpatient Prescriptions  Medication Sig Dispense Refill  .  acetaminophen-codeine (TYLENOL #3) 300-30 MG per tablet Take 1-2 tablets by mouth every 8 (eight) hours as needed for severe pain.  30 tablet  0  . atorvastatin (LIPITOR) 10 MG tablet Take 1 tablet (10 mg total) by mouth daily at 6 PM.  30 tablet  6  . furosemide (LASIX) 40 MG tablet Take 1 tablet (40 mg total) by mouth daily.  30 tablet    . insulin NPH-regular Human (NOVOLIN 70/30) (70-30) 100 UNIT/ML injection Inject 35-50 Units into the skin 2 (two) times daily with a meal. 50 units in the morning and 35 units in the evening      . lisinopril-hydrochlorothiazide (PRINZIDE,ZESTORETIC) 20-25 MG per tablet Take 1 tablet by mouth daily.  30 tablet  6  . metFORMIN (GLUCOPHAGE) 500 MG tablet Take 2 tablets (1,000 mg total) by mouth 2 (two) times daily with a meal.  120 tablet  6  . oxyCODONE-acetaminophen (ROXICET) 5-325 MG per tablet Take 1 tablet by mouth every 4 (four) hours as needed for severe pain.  30 tablet  0  . pantoprazole (PROTONIX) 40 MG tablet Take 1 tablet (40 mg total) by mouth daily.  30 tablet  2  . sulfamethoxazole-trimethoprim (BACTRIM DS) 800-160 MG per tablet Take 1 tablet by mouth 2 (two) times daily.  14 tablet  0  . warfarin (COUMADIN) 5 MG tablet Take 2.5 tablets (12.5 mg) all days of week  EXCEPT on Tues/Fri--take only 1&1/2 tablets.  75 tablet  1   No current facility-administered medications for this visit.   Family History  Problem Relation Age of Onset  . Heart disease    . Diabetes    . Obesity     History   Social History  . Marital Status: Single    Spouse Name: N/A    Number of Children: N/A  . Years of Education: 15.5   Occupational History  . [employer]   . student   .     Social History Main Topics  . Smoking status: Never Smoker   . Smokeless tobacco: Never Used  . Alcohol Use: Yes     Comment: 02/18/2013 "a few beers q 6 months or so"  . Drug Use: No  . Sexual Activity: Yes   Other Topics Concern  . Not on file   Social History Narrative    Financial assistance approved for 100% discount at [facility] and has [facility] card per [employer]   09/01/2009.      Lives in [place] with mother and sister.               Review of Systems: Review of Systems  Constitutional: Positive for malaise/fatigue. Negative for fever.  Eyes: Positive for blurred vision.  Cardiovascular: Negative for chest pain.  Gastrointestinal: Negative for diarrhea and constipation.  Genitourinary: Negative for dysuria.  Musculoskeletal: Positive for back pain.  Neurological: Positive for dizziness. Negative for sensory change and focal weakness.  Psychiatric/Behavioral: Negative for depression.     Objective:  Physical Exam: Filed Vitals:   04/08/13 1052 04/08/13 1054  BP: 101/54   Pulse: 92 93  Physical Exam  Nursing note and vitals reviewed. Constitutional: He is oriented to person, place, and time and well-developed, well-nourished, and in no distress.  Morbid obesity  Cardiovascular: Normal rate and regular rhythm.   Pulmonary/Chest: Effort normal and breath sounds normal.  Musculoskeletal: He exhibits no edema.  Neurological: He is alert and oriented to person, place, and time. He displays normal reflexes. No cranial nerve deficit. Gait normal. Coordination normal.  Skin:       Assessment & Plan:  Case discussed with Dr. Eppie Gibson See Problem Based Assessment and Plan Medications Ordered Meds ordered this encounter  Medications  . oxyCODONE-acetaminophen (ROXICET) 5-325 MG per tablet    Sig: Take 1 tablet by mouth every 4 (four) hours as needed for severe pain.    Dispense:  30 tablet    Refill:  0   Other Orders Orders Placed This Encounter  Procedures  . Glucose, capillary  . POCT INR

## 2013-04-08 NOTE — Assessment & Plan Note (Addendum)
Cellulitis much improved. Patient has completed course of bactrim and has BS under better control.  Still has induration and some drainage and encouraged continued Tx with warm compresses. Roxicet Rx for pain.  Remaining Tylenol #3 sent to pharmacy for disposal.

## 2013-04-10 NOTE — Progress Notes (Signed)
Case discussed with Dr. Hoffman at the time of the visit.  We reviewed the resident's history and exam and pertinent patient test results.  I agree with the assessment, diagnosis and plan of care documented in the resident's note. 

## 2013-05-12 ENCOUNTER — Ambulatory Visit: Payer: Self-pay | Admitting: Internal Medicine

## 2013-05-12 ENCOUNTER — Ambulatory Visit (INDEPENDENT_AMBULATORY_CARE_PROVIDER_SITE_OTHER): Payer: Self-pay | Admitting: Pharmacist

## 2013-05-12 ENCOUNTER — Encounter: Payer: Self-pay | Admitting: Internal Medicine

## 2013-05-12 ENCOUNTER — Ambulatory Visit (INDEPENDENT_AMBULATORY_CARE_PROVIDER_SITE_OTHER): Payer: Self-pay | Admitting: Internal Medicine

## 2013-05-12 ENCOUNTER — Telehealth (INDEPENDENT_AMBULATORY_CARE_PROVIDER_SITE_OTHER): Payer: Self-pay

## 2013-05-12 VITALS — BP 141/83 | HR 111 | Temp 100.3°F | Ht 73.0 in | Wt 335.6 lb

## 2013-05-12 DIAGNOSIS — Z86718 Personal history of other venous thrombosis and embolism: Secondary | ICD-10-CM

## 2013-05-12 DIAGNOSIS — I824Y9 Acute embolism and thrombosis of unspecified deep veins of unspecified proximal lower extremity: Secondary | ICD-10-CM

## 2013-05-12 DIAGNOSIS — L0231 Cutaneous abscess of buttock: Secondary | ICD-10-CM

## 2013-05-12 DIAGNOSIS — L0390 Cellulitis, unspecified: Secondary | ICD-10-CM

## 2013-05-12 DIAGNOSIS — I798 Other disorders of arteries, arterioles and capillaries in diseases classified elsewhere: Secondary | ICD-10-CM

## 2013-05-12 DIAGNOSIS — L03317 Cellulitis of buttock: Secondary | ICD-10-CM

## 2013-05-12 DIAGNOSIS — IMO0002 Reserved for concepts with insufficient information to code with codable children: Secondary | ICD-10-CM

## 2013-05-12 DIAGNOSIS — Z7901 Long term (current) use of anticoagulants: Secondary | ICD-10-CM

## 2013-05-12 DIAGNOSIS — E1051 Type 1 diabetes mellitus with diabetic peripheral angiopathy without gangrene: Secondary | ICD-10-CM

## 2013-05-12 DIAGNOSIS — L0291 Cutaneous abscess, unspecified: Secondary | ICD-10-CM

## 2013-05-12 DIAGNOSIS — E1059 Type 1 diabetes mellitus with other circulatory complications: Secondary | ICD-10-CM

## 2013-05-12 DIAGNOSIS — E1065 Type 1 diabetes mellitus with hyperglycemia: Principal | ICD-10-CM

## 2013-05-12 LAB — POCT INR: INR: 2.4

## 2013-05-12 LAB — GLUCOSE, CAPILLARY: Glucose-Capillary: 461 mg/dL — ABNORMAL HIGH (ref 70–99)

## 2013-05-12 LAB — POCT GLYCOSYLATED HEMOGLOBIN (HGB A1C): Hemoglobin A1C: 12.8

## 2013-05-12 MED ORDER — OXYCODONE-ACETAMINOPHEN 5-325 MG PO TABS: 1.0000 | ORAL_TABLET | ORAL | Status: DC | PRN

## 2013-05-12 MED ORDER — SULFAMETHOXAZOLE-TMP DS 800-160 MG PO TABS: 1.0000 | ORAL_TABLET | Freq: Two times a day (BID) | ORAL | Status: DC

## 2013-05-12 NOTE — Patient Instructions (Signed)
Patient instructed to take medications as defined in the Anti-coagulation Track section of this encounter.  Patient instructed to HOLD/OMIT today's dose--since you are commencing upon Bactrim DS which is known to increase your INR. Will evaluate INR response on Wednesday 6-MAY-15.  Patient verbalized understanding of these instructions.

## 2013-05-12 NOTE — Progress Notes (Signed)
Anti-Coagulation Progress Note  [name] is a 38 y.o. male who is currently on an anti-coagulation regimen.    RECENT RESULTS: Recent results are below, the most recent result is correlated with a dose of 72.5 mg. per week: Lab Results  Component Value Date   INR 2.40 05/12/2013   INR 6.2 04/08/2013   INR 1.00 03/31/2013    ANTI-COAG DOSE: Anticoagulation Dose Instructions as of 05/12/2013     Sun Mon Tue Wed Thu Fri Sat   New Dose 12.5 mg 0 mg 0 mg 12.5 mg 7.5 mg 12.5 mg 7.5 mg       ANTICOAG SUMMARY: Anticoagulation Episode Summary   Current INR goal 2.0-3.0  Next INR check 05/14/2013  INR from last check 2.40 (05/12/2013)  Weekly max dose   Target end date   [facility]  Preferred lab   Send INR reminders to    Indications  DVT HX OF [V12.51] Acute venous embolism and thrombosis of deep vessels of proximal lower extremity [453.41] Long term (current) use of anticoagulants [V58.61]        Comments         ANTICOAG TODAY: Anticoagulation Summary as of 05/12/2013   INR goal 2.0-3.0  Selected INR 2.40 (05/12/2013)  Next INR check 05/14/2013  Target end date    Indications  DVT HX OF [V12.51] Acute venous embolism and thrombosis of deep vessels of proximal lower extremity [453.41] Long term (current) use of anticoagulants [V58.61]      Anticoagulation Episode Summary   INR check [facility]   Preferred lab    Send INR reminders to    Comments       PATIENT INSTRUCTIONS: Patient Instructions  Patient instructed to take medications as defined in the Anti-coagulation Track section of this encounter.  Patient instructed to HOLD/OMIT today's dose--since you are commencing upon Bactrim DS which is known to increase your INR. Will evaluate INR response on Wednesday 6-MAY-15.  Patient verbalized understanding of these instructions.       FOLLOW-UP Return in about 2 days (around 05/14/2013) for Follow up INR after starting  antibiotic.Jorene Guest, III Pharm.D., CACP

## 2013-05-12 NOTE — Assessment & Plan Note (Addendum)
Patient presented with symptoms and signs suggestive of cellulitis of the left lower back slightly above the gluteal area with the possibility of underlying abscess. After discussing with the attending Dr. Marinda Elk, it was decided to call on-call surgery to get an opinion whether or not the patient would need an I&D. Spoke with the on-call surgeon Dr. Alphonsa Overall, who kindly agreed to come to the clinic and evaluate. Dr. Lucia Gaskins thought that there could be an underlying abscess and we decided to perform Incision and Drainage. The INR of 2.4 was noted prior to the procedure and the procedure was performed in the clinic. Informed consent was obtained after explaining the procedure, its risks and benefits. I performed the procedure under the supervision and assistance of Dr. Alphonsa Overall. The procedure was performed under sterile conditions and no immediate complications were noted.  See the following note for the procedural details. Purulent material was sent to the lab for culture and analysis. Patient is to be treated with Bactrim DS for 14 days along with Percocet for pain control. Patient is to follow up with me in the clinic on 05/14/13 for follow up on the wound and INR check up. See my notes on coumadin for recommendations. We truly appreciate Dr. Pollie Friar help in evaluating the patient and assisting me with the I&D. Patient is to call the clinic 0n 05/13/13 AM and make the appointment for 05/14/13.  Incision and Drainage Procedure Note  Pre-operative Diagnosis: Cellulitis of the left lower back with the possibility of an underlying abscess.  Post-operative Diagnosis: Cellulitis with abscess of the left lower back.  Indications: To look for any abscess underneath. To drain the abscess. To send the pus for cultures.  Anesthesia: 10 cc of 2% Lidocaine with Epinephrine for local anesthesia.  Procedure Details  The procedure, risks and complications have been discussed in detail including  prologed bleeding, risk of infection, injury to underlying structures such as blood vessels and nerves with the patient, and the patient has signed consent to the procedure.  The skin was sterilely prepped with povidone iodine and draped over the affected area in the usual fashion. Adequate local anesthesia was given with 10 cc of 2% lidocaine with epinephrine in a circular fashion around the indurated area. Incision and drainage was performed with a # 11 blade on the left lower back slightly above the gluteal area. Horizontal incision of about 1.5 cm long and about 0.5 cm to 1 cm deep was made across the center of the indurated area. Purulent drainage mixed with blood was noted oozing out soon after the incision was made. The purulent discharge was collected for wound culture and sensitivity analysis. Even pressure was applied all around the indurated area to further drain the pus.The remaining purulent material was soaked up with the help of sterile gauge. Using the hemostat we gently explored the incised area and using the sterile gauge, we soaked up the inside of the wound. Povidone iodine is applied on the inside of the incised area.  Sterile gauge soaked in sterile water was used to pack the cavity.  Three sterile gauges were placed over the packed wound and taped to the skin. The patient was observed for at least 20 minutes in the office and was stable without any complications.  Findings: Cellulitis with abscess of the lower back.  EBL: 2 cc's  Drains: None  Condition: Tolerated procedure well and Stable   Complications: none.

## 2013-05-12 NOTE — Assessment & Plan Note (Signed)
Therapeutic today in the office. Starting Bactrim DS for his skin abscess. Discussed with Dr. Elie Confer regarding the management of his coumadin.  Plans: Hold coumadin on 05/12/13 and 05/13/13. Follow up in the office on 05/14/13 for INR.

## 2013-05-12 NOTE — Patient Instructions (Signed)
Take the antibiotics as recommended. Take the Oxycodone as recommended for pain. Do not take Coumadin today and tomorrow. We will check your INR on 05/14/13 and recommend on coumadin. Follow up with [place] on 05/14/13. Do not remove the gauge or packing in your wound until your next office visit on 05/14/13. Change the bandage daily and clean the area with povidone iodine. Keep the bandage clean and dry.  If you have fever, chills, increased pain, increased bleeding, please give [place] a call or seek medical help. Take your insulin as recommended and bring your glucometer to your next office visit.

## 2013-05-12 NOTE — Progress Notes (Signed)
Subjective:   Patient ID: [name] male   DOB: 05/08/1975 38 y.o.   MRN: [id number]  HPI: [name] is a 38 y.o. gentleman with PMH significant for Poorly controlled DM-I with HbA1C of 12.8 today, HTN, H/O Recurrent skin infections/ cellulitis comes to the office with CC of "cyst" on his back for the last 1 week.  Patient reports that he noticed the "cyst" in the left lower back about a week ago, which is gradually getting bigger and painful. He states that the pain is very severe and is 10/10, constant. He also states that he has been having low grade temperatures once or twice a day for the last one week. He denies any discharge of pus from the lesion. He denies any nausea, vomiting, SOB. He denies any other skin lesions.  He reports that his blood sugars have been running in 300's recently and states that he didn't bring his glucometer to the office today. He reports taking 55 units of insulin in the morning and 35 units in the evening and reports compliance to insulin.   Patient was diagnosed with cellulitis involving his back on 04/01/13 and was treated with Bactrim. Patient states that the healed very well without any trouble.  Patient denies any other complaints today.    Past Medical History  Diagnosis Date  . DVT (deep venous thrombosis) 09/2002    patient reports additional DVTs in '06 & '11 (unconfirmed)  . Pulmonary embolism 09/2002    treated with 6 months of warfarin  . Hypertension   . Type I diabetes mellitus 2001  . Chest pain, neg MI, normal coronaries by cath 02/18/2013  . Hyperlipidemia 02/19/2013  . S/P cardiac cath, 02/18/13, normal coronaries 02/19/2013  . CKD (chronic kidney disease) stage 3, GFR 30-59 ml/min 02/19/2013   Current Outpatient Prescriptions  Medication Sig Dispense Refill  . acetaminophen-codeine (TYLENOL #3) 300-30 MG per tablet Take 1-2 tablets by mouth every 8 (eight) hours as needed for severe pain.  30 tablet  0  . atorvastatin  (LIPITOR) 10 MG tablet Take 1 tablet (10 mg total) by mouth daily at 6 PM.  30 tablet  6  . furosemide (LASIX) 40 MG tablet Take 1 tablet (40 mg total) by mouth daily.  30 tablet    . insulin NPH-regular Human (NOVOLIN 70/30) (70-30) 100 UNIT/ML injection Inject 35-50 Units into the skin 2 (two) times daily with a meal. 50 units in the morning and 35 units in the evening      . lisinopril-hydrochlorothiazide (PRINZIDE,ZESTORETIC) 20-25 MG per tablet Take 1 tablet by mouth daily.  30 tablet  6  . metFORMIN (GLUCOPHAGE) 500 MG tablet Take 2 tablets (1,000 mg total) by mouth 2 (two) times daily with a meal.  120 tablet  6  . oxyCODONE-acetaminophen (ROXICET) 5-325 MG per tablet Take 1 tablet by mouth every 4 (four) hours as needed for severe pain.  30 tablet  0  . pantoprazole (PROTONIX) 40 MG tablet Take 1 tablet (40 mg total) by mouth daily.  30 tablet  2  . promethazine (PHENERGAN) 25 MG tablet Take 0.5-1 tablets (12.5-25 mg total) by mouth every 6 (six) hours as needed for nausea or vomiting.  12 tablet  1  .      . warfarin (COUMADIN) 5 MG tablet Take 2.5 tablets (12.5 mg) all days of week EXCEPT on Tues/Fri--take only 1&1/2 tablets.  75 tablet  1   No current facility-administered medications for this visit.  Family History  Problem Relation Age of Onset  . Heart disease    . Diabetes    . Obesity     History   Social History  . Marital Status: Single    Spouse Name: N/A    Number of Children: N/A  . Years of Education: 15.5   Occupational History  . [employer]   . student   .     Social History Main Topics  . Smoking status: Never Smoker   . Smokeless tobacco: Never Used  . Alcohol Use: Yes     Comment: 02/18/2013 "a few beers q 6 months or so"  . Drug Use: No  . Sexual Activity: Yes   Other Topics Concern  . None   Social History Narrative   Financial assistance approved for 100% discount at [facility] and has [facility] card per [employer]   09/01/2009.      Lives in  [place] with mother and sister.               Review of Systems: Pertinent items are noted in HPI. Objective:  Physical Exam: Filed Vitals:   05/12/13 1516  BP: 141/83  Pulse: 111  Temp: 100.3 F (37.9 C)  TempSrc: Oral  Height: 6\' 1"  (1.854 m)  Weight: 335 lb 9.6 oz (152.227 kg)  SpO2: 98%   Constitutional: Vital signs reviewed.   Patient is a obese, well-developed and well-nourished and is in no acute distress and cooperative with exam. Alert and oriented x3.  Head: Normocephalic and atraumatic Cardiovascular: Mild tachycardia but regular in rhythm. S1S2 normal. No murmurs heard. Pulmonary/Chest: Normal vesicular breath sounds without any adventitious sounds. Abdominal: Soft. Normal BS. Skin: Examination of the back: There is an indurated area over the left lower back slightly above the superior aspect of the gluteus maximus. The center of the induration shows signs of spontaneous drainage with surrounding scaling. The indurated area is round in shape, about 6 cm in diameter, slightly warm and very tender to touch. The indurated area feels firm to touch, with no signs of fluctuance. There is a sign of old ulcer with scar tissue that healed very well (noted few inches above this induration). Neurological: A&O x3. Psychiatric: Normal mood and affect.   Assessment & Plan:

## 2013-05-12 NOTE — Assessment & Plan Note (Signed)
Poorly controlled with an A1C of 12.8 today. Patient reports compliance to his insulin regimen. In the setting of recurrent skin infections, I explained it to the patient regarding the role of better control of DM. Plans: Recommended to bring his glucometer to his next office visit. Will need regular closer follow ups while we titrate the insulin.

## 2013-05-12 NOTE — Telephone Encounter (Signed)
Dr Ulla Potash is wanting the DOW to come see a pt in there outpt clinic b/c the pt has no money. I advised that normally we only do inpt consults in the hospital that the pt would need to make an appt to see [place] in the office. Dr Ulla Potash said the pt has no money to come into the office and I advised that the pt would need to apply for medicaid or the orange card program for assistance. I did advise that the office does expect for a new pt consult visit $226 on the first visit. Dr Ulla Potash asked to speak to the doctor which is Dr Lucia Gaskins so I paged Dr Lucia Gaskins. Merrilee Seashore in the OR returned call for Dr Lucia Gaskins and I gave him the information to see if Dr Lucia Gaskins would even consider seeing this pt. I gave phone# for them to call Dr Ulla Potash.

## 2013-05-14 ENCOUNTER — Ambulatory Visit (INDEPENDENT_AMBULATORY_CARE_PROVIDER_SITE_OTHER): Payer: Self-pay | Admitting: Pharmacist

## 2013-05-14 ENCOUNTER — Ambulatory Visit (INDEPENDENT_AMBULATORY_CARE_PROVIDER_SITE_OTHER): Payer: Self-pay | Admitting: Internal Medicine

## 2013-05-14 ENCOUNTER — Encounter: Payer: Self-pay | Admitting: Internal Medicine

## 2013-05-14 VITALS — BP 121/68 | HR 106 | Temp 99.0°F | Ht 74.0 in | Wt 335.1 lb

## 2013-05-14 DIAGNOSIS — E1065 Type 1 diabetes mellitus with hyperglycemia: Secondary | ICD-10-CM

## 2013-05-14 DIAGNOSIS — Z86718 Personal history of other venous thrombosis and embolism: Secondary | ICD-10-CM

## 2013-05-14 DIAGNOSIS — E1059 Type 1 diabetes mellitus with other circulatory complications: Secondary | ICD-10-CM

## 2013-05-14 DIAGNOSIS — L02219 Cutaneous abscess of trunk, unspecified: Secondary | ICD-10-CM

## 2013-05-14 DIAGNOSIS — IMO0002 Reserved for concepts with insufficient information to code with codable children: Secondary | ICD-10-CM

## 2013-05-14 DIAGNOSIS — L0390 Cellulitis, unspecified: Principal | ICD-10-CM

## 2013-05-14 DIAGNOSIS — I824Y9 Acute embolism and thrombosis of unspecified deep veins of unspecified proximal lower extremity: Secondary | ICD-10-CM

## 2013-05-14 DIAGNOSIS — L03319 Cellulitis of trunk, unspecified: Secondary | ICD-10-CM

## 2013-05-14 DIAGNOSIS — L0291 Cutaneous abscess, unspecified: Secondary | ICD-10-CM

## 2013-05-14 DIAGNOSIS — I798 Other disorders of arteries, arterioles and capillaries in diseases classified elsewhere: Secondary | ICD-10-CM

## 2013-05-14 DIAGNOSIS — E1051 Type 1 diabetes mellitus with diabetic peripheral angiopathy without gangrene: Secondary | ICD-10-CM

## 2013-05-14 DIAGNOSIS — Z7901 Long term (current) use of anticoagulants: Secondary | ICD-10-CM

## 2013-05-14 LAB — GLUCOSE, CAPILLARY: GLUCOSE-CAPILLARY: 95 mg/dL (ref 70–99)

## 2013-05-14 LAB — POCT INR: INR: 2.2

## 2013-05-14 NOTE — Patient Instructions (Signed)
Patient instructed to take medications as defined in the Anti-coagulation Track section of this encounter.  Patient instructed to take today's dose.  Patient verbalized understanding of these instructions.    

## 2013-05-14 NOTE — Patient Instructions (Signed)
Dressing Change A dressing is a material placed over wounds. It keeps the wound clean, dry, and protected from further injury. This provides an environment that favors wound healing.  BEFORE YOU BEGIN  Get your supplies together. Things you may need include:  Saline solution.  Flexible gauze dressing.  Medicated cream.  Tape.  Gloves.  Abdominal dressing pads.  Gauze squares.  Plastic bags.  Take pain medicine 30 minutes before the dressing change if you need it.  Take a shower before you do the first dressing change of the day. Use plastic wrap or a plastic bag to prevent the dressing from getting wet. REMOVING YOUR OLD DRESSING   Wash your hands with soap and water. Dry your hands with a clean towel.  Put on your gloves.  Remove any tape.  Carefully remove the old dressing. If the dressing sticks, you may dampen it with warm water to loosen it, or follow your caregiver's specific directions.  Remove any gauze or packing tape that is in your wound.  Take off your gloves.  Put the gloves, tape, gauze, or any packing tape into a plastic bag. CHANGING YOUR DRESSING  Open the supplies.  Take the cap off the saline solution.  Open the gauze package so that the gauze remains on the inside of the package.  Put on your gloves.  Clean your wound as told by your caregiver.  If you have been told to keep your wound dry, follow those instructions.  Your caregiver may tell you to do one or more of the following:  Pick up the gauze. Pour the saline solution over the gauze. Squeeze out the extra saline solution.  Put medicated cream or other medicine on your wound if you have been told to do so.  Put the solution soaked gauze only in your wound, not on the skin around it.  Pack your wound loosely or as told by your caregiver.  Put dry gauze on your wound.  Put abdominal dressing pads over the dry gauze if your wet gauze soaks through.  Tape the abdominal dressing  pads in place so they will not fall off. Do not wrap the tape completely around the affected part (arm, leg, abdomen).  Wrap the dressing pads with a flexible gauze dressing to secure it in place.  Take off your gloves. Put them in the plastic bag with the old dressing. Tie the bag shut and throw it away.  Keep the dressing clean and dry until your next dressing change.  Wash your hands. SEEK MEDICAL CARE IF:  Your skin around the wound looks red.  Your wound feels more tender or sore.  You see pus in the wound.  Your wound smells bad.  You have a fever.  Your skin around the wound has a rash that itches and burns.  You see black or yellow skin in your wound that was not there before.  You feel nauseous, throw up, and feel very tired. Document Released: 02/03/2004 Document Revised: 03/20/2011 Document Reviewed: 11/07/2010 [facility] Patient Information 2014 [place], [place].  Follow up in the clinic on 05/19/13. If you notice any pus or redness around the wound, please let us know or seek medical help.

## 2013-05-14 NOTE — Progress Notes (Signed)
Anti-Coagulation Progress Note  [name] is a 38 y.o. male who is currently on an anti-coagulation regimen.    RECENT RESULTS: Recent results are below, the most recent result is correlated with a dose of having omitted TWO DAYS of warfarin and having a follow up INR today while seeing the PCP. He IS on SMX-TMP known to cause a marked hypoprothrombinemic response to warfarin. Despite TWO OMITTED doses--the INR remains in target range. Will re-initiate warfarin at an empirically lowered dose qd while continuing SMX-TMP. Will evaluate INR on Monday 11-MAY-15. Lab Results  Component Value Date   INR 2.2 05/14/2013   INR 2.40 05/12/2013   INR 6.2 04/08/2013    ANTI-COAG DOSE: Anticoagulation Dose Instructions as of 05/14/2013     [name] Tue Wed Thu Fri Sat   New Dose 7.5 mg 0 mg 0 mg 7.5 mg 7.5 mg 7.5 mg 7.5 mg       ANTICOAG SUMMARY: Anticoagulation Episode Summary   Current INR goal 2.0-3.0  Next INR check 05/19/2013  INR from last check 2.2 (05/14/2013)  Weekly max dose   Target end date   [facility]  Preferred lab   Send INR reminders to    Indications  DVT HX OF [V12.51] Acute venous embolism and thrombosis of deep vessels of proximal lower extremity [453.41] Long term (current) use of anticoagulants [V58.61]        Comments         ANTICOAG TODAY: Anticoagulation Summary as of 05/14/2013   INR goal 2.0-3.0  Selected INR 2.2 (05/14/2013)  Next INR check 05/19/2013  Target end date    Indications  DVT HX OF [V12.51] Acute venous embolism and thrombosis of deep vessels of proximal lower extremity [453.41] Long term (current) use of anticoagulants [V58.61]      Anticoagulation Episode Summary   [facility] check [facility]   Preferred lab    Send INR reminders to    Comments       PATIENT INSTRUCTIONS: Patient Instructions  Patient instructed to take medications as defined in the Anti-coagulation Track section of this encounter.   Patient instructed to take today's dose.  Patient verbalized understanding of these instructions.       FOLLOW-UP Return in 5 days (on 05/19/2013) for Follow up INR at 1115h.  Jorene Guest, III Pharm.D., CACP

## 2013-05-14 NOTE — Assessment & Plan Note (Signed)
INR 2.2 today. Currently on Bactrim. Dr. Elie Confer gave recommendations on how much coumadin to take. Will follow up on Monday.

## 2013-05-14 NOTE — Progress Notes (Signed)
Subjective:   Patient ID: [name] male   DOB: 1975-11-23 38 y.o.   MRN: [id number]  HPI:  [name] is a 38 y.o. gentleman with PMH significant for Poorly controlled DM-I with HbA1C of 12.8, HTN, H/O Recurrent skin infections/ cellulitis comes to the office for a dressing change of his I&D that was done on 05/12/13.  Patient underwent I&D for cellulitis and abscess of his left lower back and he is here for a dressing change and INR check. Patient denies any complaints during this office. He reports that the pain is well controlled on the percocet. He denies any discharge of pus from the wound or bleeding.   Patient is here with his "girl friend" who will be helping him with dressing changes.    Past Medical History  Diagnosis Date  . DVT (deep venous thrombosis) 09/2002    patient reports additional DVTs in '06 & '11 (unconfirmed)  . Pulmonary embolism 09/2002    treated with 6 months of warfarin  . Hypertension   . Type I diabetes mellitus 2001  . Chest pain, neg MI, normal coronaries by cath 02/18/2013  . Hyperlipidemia 02/19/2013  . S/P cardiac cath, 02/18/13, normal coronaries 02/19/2013  . CKD (chronic kidney disease) stage 3, GFR 30-59 ml/min 02/19/2013   Current Outpatient Prescriptions  Medication Sig Dispense Refill  . acetaminophen-codeine (TYLENOL #3) 300-30 MG per tablet Take 1-2 tablets by mouth every 8 (eight) hours as needed for severe pain.  30 tablet  0  . atorvastatin (LIPITOR) 10 MG tablet Take 1 tablet (10 mg total) by mouth daily at 6 PM.  30 tablet  6  . furosemide (LASIX) 40 MG tablet Take 1 tablet (40 mg total) by mouth daily.  30 tablet    . insulin NPH-regular Human (NOVOLIN 70/30) (70-30) 100 UNIT/ML injection Inject 35-50 Units into the skin 2 (two) times daily with a meal. 50 units in the morning and 35 units in the evening      . lisinopril-hydrochlorothiazide (PRINZIDE,ZESTORETIC) 20-25 MG per tablet Take 1 tablet by mouth daily.  30 tablet  6  .  metFORMIN (GLUCOPHAGE) 500 MG tablet Take 2 tablets (1,000 mg total) by mouth 2 (two) times daily with a meal.  120 tablet  6  . oxyCODONE-acetaminophen (ROXICET) 5-325 MG per tablet Take 1 tablet by mouth every 4 (four) hours as needed for severe pain.  30 tablet  0  . pantoprazole (PROTONIX) 40 MG tablet Take 1 tablet (40 mg total) by mouth daily.  30 tablet  2  . promethazine (PHENERGAN) 25 MG tablet Take 0.5-1 tablets (12.5-25 mg total) by mouth every 6 (six) hours as needed for nausea or vomiting.  12 tablet  1  . sulfamethoxazole-trimethoprim (BACTRIM DS) 800-160 MG per tablet Take 1 tablet by mouth 2 (two) times daily.  28 tablet  0  . warfarin (COUMADIN) 5 MG tablet Take 2.5 tablets (12.5 mg) all days of week EXCEPT on Tues/Fri--take only 1&1/2 tablets.  75 tablet  1   No current facility-administered medications for this visit.   Family History  Problem Relation Age of Onset  . Heart disease    . Diabetes    . Obesity     History   Social History  . Marital Status: Single    Spouse Name: N/A    Number of Children: N/A  . Years of Education: 15.5   Occupational History  . [employer]   . student   .  Social History Main Topics  . Smoking status: Never Smoker   . Smokeless tobacco: Never Used  . Alcohol Use: Yes     Comment: 02/18/2013 "a few beers q 6 months or so"  . Drug Use: No  . Sexual Activity: Yes   Other Topics Concern  . None   Social History Narrative   Financial assistance approved for 100% discount at [employer] and has [facility] card per [employer]   09/01/2009.      Lives in [place] with mother and sister.               Review of Systems: Pertinent items are noted in HPI. Objective:  Physical Exam: Filed Vitals:   05/14/13 1021  BP: 121/68  Pulse: 106  Temp: 99 F (37.2 C)  TempSrc: Oral  Height: 6\' 2"  (1.88 m)  Weight: 335 lb 1.6 oz (152 kg)  SpO2: 99%   Constitutional: Vital signs reviewed.  Patient is a obese, well-developed and  well-nourished and is in no acute distress and cooperative with exam. Alert and oriented x3.  Head: Normocephalic and atraumatic  Cardiovascular: Mild tachycardia but regular in rhythm. S1S2 normal. No murmurs heard.  Pulmonary/Chest: Normal vesicular breath sounds without any adventitious sounds.  Abdominal: Soft. Normal BS.  Skin: Examination of the back:  Old dressing removed along with packing gauge inside the wound. There is no surrounding erythema around the incision and the indurated area appears to be smaller in size. There is no purulent discharge or bleeding noted inside the wound. Wound looks dry and healthy. Wound and the surrounding area was cleaned with Betadine solution and a betadine tip was inserted inside the wound. Sterile water was used to wet the gauge which was then inserted inside the wound and packed. Dry dressing was applied over the wound and tape was applied. Neurological: A&O x3. Psychiatric: Normal mood and affect.   Assessment & Plan:

## 2013-05-14 NOTE — Assessment & Plan Note (Signed)
S/p I&D on 05/12/13. Follows today for dressing change. Dressing changed and the wound looks healthy. See physical exam notes for more details. Awaiting final cultures.  Plans: Continue Bactrim DS for a total 14 days. Percocet for pain as needed. Dressing change instructions were given to the patient and taught to his girl friend. Follow up in a week for INR check and wound exam.

## 2013-05-14 NOTE — Assessment & Plan Note (Signed)
Patient reports that his blood sugars have improved in the last few days, which makes me wonder if he was taking insulin regularly before. Recommended to bring his glucometer to his next office visit. Patient needs closer follow ups.

## 2013-05-15 LAB — WOUND CULTURE: Gram Stain: NONE SEEN

## 2013-05-15 NOTE — Progress Notes (Signed)
Case discussed with Dr. Boggala at the time of the visit.  We reviewed the resident's history and exam and pertinent patient test results.  I agree with the assessment, diagnosis, and plan of care documented in the resident's note. 

## 2013-05-15 NOTE — Progress Notes (Signed)
I saw and evaluated the patient.  I personally confirmed the key portions of the history and exam documented by Dr. Eyvonne Mechanic and I reviewed pertinent patient test results.  The assessment, diagnosis, and plan were formulated together and I agree with the documentation in the resident's note.  We appreciate the help from surgeon Dr. Lucia Gaskins, who saw patient in consultation, recommended I&D, and supervised and directed the procedure.

## 2013-05-19 ENCOUNTER — Ambulatory Visit (INDEPENDENT_AMBULATORY_CARE_PROVIDER_SITE_OTHER): Payer: Self-pay | Admitting: Pharmacist

## 2013-05-19 DIAGNOSIS — Z86718 Personal history of other venous thrombosis and embolism: Secondary | ICD-10-CM

## 2013-05-19 DIAGNOSIS — I824Y9 Acute embolism and thrombosis of unspecified deep veins of unspecified proximal lower extremity: Secondary | ICD-10-CM

## 2013-05-19 DIAGNOSIS — Z7901 Long term (current) use of anticoagulants: Secondary | ICD-10-CM

## 2013-05-19 NOTE — Patient Instructions (Signed)
Patient instructed to take medications as defined in the Anti-coagulation Track section of this encounter.  Patient instructed to take today's dose.  Patient verbalized understanding of these instructions.    

## 2013-05-19 NOTE — Progress Notes (Signed)
Anti-Coagulation Progress Note  [name] is a 38 y.o. male who is currently on an anti-coagulation regimen.    RECENT RESULTS: Recent results are below, the most recent result is correlated with a dose of having not taken any since Thursday of last week.  Lab Results  Component Value Date   INR 2.2 05/14/2013   INR 2.40 05/12/2013   INR 6.2 04/08/2013    ANTI-COAG DOSE: Anticoagulation Dose Instructions as of 05/19/2013     [name] Tue Wed Thu Fri Sat   New Dose 7.5 mg 10 mg 10 mg 7.5 mg 7.5 mg 7.5 mg 7.5 mg       ANTICOAG SUMMARY: Anticoagulation Episode Summary   Current INR goal 2.0-3.0  Next INR check 05/22/2013  INR from last check 2.2 (05/14/2013)  Weekly max dose   Target end date   [facility]  Preferred lab   Send INR reminders to    Indications  DVT HX OF [V12.51] Acute venous embolism and thrombosis of deep vessels of proximal lower extremity [453.41] Long term (current) use of anticoagulants [V58.61]        Comments         ANTICOAG TODAY: Anticoagulation Summary as of 05/19/2013   INR goal 2.0-3.0  Selected INR 2.2 (05/14/2013)  Next INR check 05/22/2013  Target end date    Indications  DVT HX OF [V12.51] Acute venous embolism and thrombosis of deep vessels of proximal lower extremity [453.41] Long term (current) use of anticoagulants [V58.61]      Anticoagulation Episode Summary   [facility]   Preferred lab    Send INR reminders to    Comments       PATIENT INSTRUCTIONS: Patient Instructions  Patient instructed to take medications as defined in the Anti-coagulation Track section of this encounter.  Patient instructed to take today's dose.  Patient verbalized understanding of these instructions.       FOLLOW-UP Return in about 3 days (around 05/22/2013) for Follow up INR at 1130h.  Jorene Guest, III Pharm.D., CACP

## 2013-05-20 ENCOUNTER — Ambulatory Visit (INDEPENDENT_AMBULATORY_CARE_PROVIDER_SITE_OTHER): Payer: Self-pay | Admitting: Internal Medicine

## 2013-05-20 ENCOUNTER — Encounter: Payer: Self-pay | Admitting: Internal Medicine

## 2013-05-20 VITALS — BP 124/69 | HR 94 | Temp 98.8°F | Ht 74.0 in | Wt 340.0 lb

## 2013-05-20 DIAGNOSIS — E1051 Type 1 diabetes mellitus with diabetic peripheral angiopathy without gangrene: Secondary | ICD-10-CM

## 2013-05-20 DIAGNOSIS — L0390 Cellulitis, unspecified: Principal | ICD-10-CM

## 2013-05-20 DIAGNOSIS — E1065 Type 1 diabetes mellitus with hyperglycemia: Secondary | ICD-10-CM

## 2013-05-20 DIAGNOSIS — E1059 Type 1 diabetes mellitus with other circulatory complications: Secondary | ICD-10-CM

## 2013-05-20 DIAGNOSIS — L02219 Cutaneous abscess of trunk, unspecified: Secondary | ICD-10-CM

## 2013-05-20 DIAGNOSIS — I10 Essential (primary) hypertension: Secondary | ICD-10-CM

## 2013-05-20 DIAGNOSIS — I798 Other disorders of arteries, arterioles and capillaries in diseases classified elsewhere: Secondary | ICD-10-CM

## 2013-05-20 DIAGNOSIS — L03319 Cellulitis of trunk, unspecified: Secondary | ICD-10-CM

## 2013-05-20 DIAGNOSIS — IMO0002 Reserved for concepts with insufficient information to code with codable children: Secondary | ICD-10-CM

## 2013-05-20 DIAGNOSIS — L0291 Cutaneous abscess, unspecified: Secondary | ICD-10-CM

## 2013-05-20 DIAGNOSIS — A4901 Methicillin susceptible Staphylococcus aureus infection, unspecified site: Secondary | ICD-10-CM

## 2013-05-20 NOTE — Assessment & Plan Note (Signed)
S/p I&D on 05/12/13.  Follows today for dressing change.  Dressing changed and the wound looks healthy. See physical exam notes for more details.  Cultures positive for Staph aureus pan-sensitive except for penicillin.  Plans:  Continue Bactrim DS for a total 14 days.  Percocet for pain as needed.  Continue the dressing daily to keep the wound clean and dry. Follow up in two weeks.

## 2013-05-20 NOTE — Progress Notes (Signed)
Subjective:   Patient ID: [name] male   DOB: February 12, 1975 38 y.o.   MRN: [id number]  HPI: VictorSaatvik Kelley is a 38 y.o. gentleman with PMH significant for Poorly controlled DM-I with HbA1C of 12.8, HTN, H/O Recurrent skin infections/ cellulitis comes to the office for a dressing change of his I&D that was done on 05/12/13.   Patient underwent I&D for cellulitis and abscess of his left lower back and he is here for a follow up and a dressing change. Patient denies any complaints during this office. He reports that the pain is well controlled on the percocet. He denies any discharge of pus from the wound or bleeding.   Patient is here with his "girl friend" who is helping him with dressing changes.    Past Medical History  Diagnosis Date  . DVT (deep venous thrombosis) 09/2002    patient reports additional DVTs in '06 & '11 (unconfirmed)  . Pulmonary embolism 09/2002    treated with 6 months of warfarin  . Hypertension   . Type I diabetes mellitus 2001  . Chest pain, neg MI, normal coronaries by cath 02/18/2013  . Hyperlipidemia 02/19/2013  . S/P cardiac cath, 02/18/13, normal coronaries 02/19/2013  . CKD (chronic kidney disease) stage 3, GFR 30-59 ml/min 02/19/2013   Current Outpatient Prescriptions  Medication Sig Dispense Refill  . atorvastatin (LIPITOR) 10 MG tablet Take 1 tablet (10 mg total) by mouth daily at 6 PM.  30 tablet  6  . furosemide (LASIX) 40 MG tablet Take 1 tablet (40 mg total) by mouth daily.  30 tablet    . insulin NPH-regular Human (NOVOLIN 70/30) (70-30) 100 UNIT/ML injection Inject 35-50 Units into the skin 2 (two) times daily with a meal. 50 units in the morning and 35 units in the evening      . lisinopril-hydrochlorothiazide (PRINZIDE,ZESTORETIC) 20-25 MG per tablet Take 1 tablet by mouth daily.  30 tablet  6  . metFORMIN (GLUCOPHAGE) 500 MG tablet Take 2 tablets (1,000 mg total) by mouth 2 (two) times daily with a meal.  120 tablet  6  . oxyCODONE-acetaminophen  (ROXICET) 5-325 MG per tablet Take 1 tablet by mouth every 4 (four) hours as needed for severe pain.  30 tablet  0  . pantoprazole (PROTONIX) 40 MG tablet Take 1 tablet (40 mg total) by mouth daily.  30 tablet  2  . promethazine (PHENERGAN) 25 MG tablet Take 0.5-1 tablets (12.5-25 mg total) by mouth every 6 (six) hours as needed for nausea or vomiting.  12 tablet  1  . sulfamethoxazole-trimethoprim (BACTRIM DS) 800-160 MG per tablet Take 1 tablet by mouth 2 (two) times daily.  28 tablet  0  . warfarin (COUMADIN) 5 MG tablet Take 2.5 tablets (12.5 mg) all days of week EXCEPT on Tues/Fri--take only 1&1/2 tablets.  75 tablet  1   No current facility-administered medications for this visit.   Family History  Problem Relation Age of Onset  . Heart disease    . Diabetes    . Obesity     History   Social History  . Marital Status: Single    Spouse Name: N/A    Number of Children: N/A  . Years of Education: 15.5   Occupational History  . [employer]   . student   .     Social History Main Topics  . Smoking status: Never Smoker   . Smokeless tobacco: Never Used  . Alcohol Use: Yes  Comment: 02/18/2013 "a few beers q 6 months or so"  . Drug Use: No  . Sexual Activity: Yes   Other Topics Concern  . None   Social History Narrative   Financial assistance approved for 100% discount at [facility] and has [facility] card per [employer]   09/01/2009.      Lives in [place] with mother and sister.               Review of Systems: Pertinent items are noted in HPI. Objective:  Physical Exam: Filed Vitals:   05/20/13 1335  BP: 124/69  Pulse: 94  Temp: 98.8 F (37.1 C)  TempSrc: Oral  Height: 6\' 2"  (1.88 m)  Weight: 340 lb (154.223 kg)  SpO2: 99%   Constitutional: Vital signs reviewed.  Patient is a obese, well-developed and well-nourished and is in no acute distress and cooperative with exam. Alert and oriented x3.  Head: Normocephalic and atraumatic  Cardiovascular: S1S2 normal.  No murmurs heard.  Pulmonary/Chest: Normal vesicular breath sounds without any adventitious sounds.  Skin: Examination of the back: Old dressing removed along with packing gauge inside the wound. There is no surrounding erythema around the incision and there is no induration around the incision site. There is no purulent discharge or bleeding noted inside the wound. Wound looks dry and healthy. Wound and the surrounding area was cleaned with Betadine solution and betadine tip was inserted inside the incision site. Dry dressing was applied over the wound and taped.  Neurological: A&O x3. Psychiatric: Normal mood and affect.   Assessment & Plan:

## 2013-05-20 NOTE — Patient Instructions (Signed)
Continue the antibiotics as instructed. Check your blood sugars daily as instructed before and bring your glucometer to the next office visit. Change the dressings daily as was previously taught.  BEFORE YOU BEGIN  Get your supplies together. Things you may need include:  Saline solution.  Flexible gauze dressing.  Medicated cream.  Tape.  Gloves.  Abdominal dressing pads.  Gauze squares.  Plastic bags.  Take pain medicine 30 minutes before the dressing change if you need it.  Take a shower before you do the first dressing change of the day. Use plastic wrap or a plastic bag to prevent the dressing from getting wet. REMOVING YOUR OLD DRESSING  Wash your hands with soap and water. Dry your hands with a clean towel.  Put on your gloves.  Remove any tape.  Carefully remove the old dressing. If the dressing sticks, you may dampen it with warm water to loosen it, or follow your caregiver's specific directions.  Remove any gauze or packing tape that is in your wound.  Take off your gloves.  Put the gloves, tape, gauze, or any packing tape into a plastic bag. CHANGING YOUR DRESSING  Open the supplies.  Take the cap off the saline solution.  Open the gauze package so that the gauze remains on the inside of the package.  Put on your gloves.  Clean your wound as told by your caregiver.  If you have been told to keep your wound dry, follow those instructions.  Your caregiver may tell you to do one or more of the following:  Pick up the gauze. Pour the saline solution over the gauze. Squeeze out the extra saline solution.  Put medicated cream or other medicine on your wound if you have been told to do so.  Put the solution soaked gauze only in your wound, not on the skin around it.  Pack your wound loosely or as told by your caregiver.  Put dry gauze on your wound.  Put abdominal dressing pads over the dry gauze if your wet gauze soaks through.  Tape the abdominal dressing pads in place so  they will not fall off. Do not wrap the tape completely around the affected part (arm, leg, abdomen).  Wrap the dressing pads with a flexible gauze dressing to secure it in place.  Take off your gloves. Put them in the plastic bag with the old dressing. Tie the bag shut and throw it away.  Keep the dressing clean and dry until your next dressing change.  Wash your hands. SEEK MEDICAL CARE IF:  Your skin around the wound looks red.  Your wound feels more tender or sore.  You see pus in the wound.  Your wound smells bad.  You have a fever.  Your skin around the wound has a rash that itches and burns.  You see black or yellow skin in your wound that was not there before.  You feel nauseous, throw up, and feel very tired.

## 2013-05-20 NOTE — Assessment & Plan Note (Signed)
Patient does not bring his glucometer despite asking him repeatedly during every office visit. Not sure if he is checking his blood sugars. He states that his blood sugars are in "160's"  Plans: Recommended to bring his glucometer to every office visit. Continue current management.

## 2013-05-20 NOTE — Assessment & Plan Note (Signed)
Well controlled.  Plans: Continue current meds.

## 2013-05-26 ENCOUNTER — Telehealth: Payer: Self-pay | Admitting: *Deleted

## 2013-05-26 ENCOUNTER — Ambulatory Visit (INDEPENDENT_AMBULATORY_CARE_PROVIDER_SITE_OTHER): Payer: Self-pay | Admitting: Pharmacist

## 2013-05-26 DIAGNOSIS — I824Y9 Acute embolism and thrombosis of unspecified deep veins of unspecified proximal lower extremity: Secondary | ICD-10-CM

## 2013-05-26 DIAGNOSIS — Z7901 Long term (current) use of anticoagulants: Secondary | ICD-10-CM

## 2013-05-26 DIAGNOSIS — Z86718 Personal history of other venous thrombosis and embolism: Secondary | ICD-10-CM

## 2013-05-26 LAB — POCT INR: INR: 2.9

## 2013-05-26 NOTE — Progress Notes (Signed)
Anti-Coagulation Progress Note  [name] is a 38 y.o. male who is currently on an anti-coagulation regimen.    RECENT RESULTS: Recent results are below, the most recent result is correlated with a dose of 57.5 mg. per week: Lab Results  Component Value Date   INR 2.90 05/26/2013   INR 2.2 05/14/2013   INR 2.40 05/12/2013    ANTI-COAG DOSE: Anticoagulation Dose Instructions as of 05/26/2013     Sun Mon Tue Wed Thu Fri Sat   New Dose 10 mg 10 mg 7.5 mg 10 mg 7.5 mg 10 mg 7.5 mg       ANTICOAG SUMMARY: Anticoagulation Episode Summary   Current INR goal 2.0-3.0  Next INR check 06/04/2013  INR from last check 2.90 (05/26/2013)  Weekly max dose   Target end date   [facility]  Preferred lab   Send INR reminders to    Indications  DVT HX OF [V12.51] Acute venous embolism and thrombosis of deep vessels of proximal lower extremity [453.41] Long term (current) use of anticoagulants [V58.61]        Comments         ANTICOAG TODAY: Anticoagulation Summary as of 05/26/2013   INR goal 2.0-3.0  Selected INR 2.90 (05/26/2013)  Next INR check 06/04/2013  Target end date    Indications  DVT HX OF [V12.51] Acute venous embolism and thrombosis of deep vessels of proximal lower extremity [453.41] Long term (current) use of anticoagulants [V58.61]      Anticoagulation Episode Summary   [facility] check [facility]   Preferred lab    Send INR reminders to    Comments       PATIENT INSTRUCTIONS: Patient Instructions  Patient instructed to take medications as defined in the Anti-coagulation Track section of this encounter.  Patient instructed to take today's dose.  Patient verbalized understanding of these instructions.       FOLLOW-UP Return in 9 days (on 06/04/2013) for Follow up INR at 2:15pm.  Jorene Guest, III Pharm.D., CACP

## 2013-05-26 NOTE — Patient Instructions (Signed)
Patient instructed to take medications as defined in the Anti-coagulation Track section of this encounter.  Patient instructed to take today's dose.  Patient verbalized understanding of these instructions.    

## 2013-05-26 NOTE — Telephone Encounter (Signed)
Pt presents asking for a letter to his atty for disability, stating all his medical conditions, hospitalizations- in depth report of his medical conditions that qualify him for disability. Please address to [place] [employer] Ph [phone]

## 2013-06-03 ENCOUNTER — Encounter: Payer: Self-pay | Admitting: Internal Medicine

## 2013-06-03 ENCOUNTER — Ambulatory Visit (INDEPENDENT_AMBULATORY_CARE_PROVIDER_SITE_OTHER): Payer: Self-pay | Admitting: Internal Medicine

## 2013-06-03 VITALS — BP 122/72 | HR 97 | Temp 98.0°F | Ht 74.0 in | Wt 338.2 lb

## 2013-06-03 DIAGNOSIS — L02219 Cutaneous abscess of trunk, unspecified: Secondary | ICD-10-CM

## 2013-06-03 DIAGNOSIS — I10 Essential (primary) hypertension: Secondary | ICD-10-CM

## 2013-06-03 DIAGNOSIS — I798 Other disorders of arteries, arterioles and capillaries in diseases classified elsewhere: Secondary | ICD-10-CM

## 2013-06-03 DIAGNOSIS — E1065 Type 1 diabetes mellitus with hyperglycemia: Principal | ICD-10-CM

## 2013-06-03 DIAGNOSIS — E1051 Type 1 diabetes mellitus with diabetic peripheral angiopathy without gangrene: Secondary | ICD-10-CM

## 2013-06-03 DIAGNOSIS — IMO0002 Reserved for concepts with insufficient information to code with codable children: Secondary | ICD-10-CM

## 2013-06-03 DIAGNOSIS — L0291 Cutaneous abscess, unspecified: Secondary | ICD-10-CM

## 2013-06-03 DIAGNOSIS — L0390 Cellulitis, unspecified: Secondary | ICD-10-CM

## 2013-06-03 DIAGNOSIS — E1059 Type 1 diabetes mellitus with other circulatory complications: Secondary | ICD-10-CM

## 2013-06-03 DIAGNOSIS — L03319 Cellulitis of trunk, unspecified: Secondary | ICD-10-CM

## 2013-06-03 LAB — GLUCOSE, CAPILLARY: Glucose-Capillary: 278 mg/dL — ABNORMAL HIGH (ref 70–99)

## 2013-06-03 NOTE — Assessment & Plan Note (Signed)
S/p I&D on 05/12/13. Wound site healing very well and no signs of super added infection. Finished 2 weeks of abx therapy. No follow up needed for the I&D.

## 2013-06-03 NOTE — Progress Notes (Signed)
Case discussed with Dr. Boggala at the time of the visit.  We reviewed the resident's history and exam and pertinent patient test results.  I agree with the assessment, diagnosis, and plan of care documented in the resident's note. 

## 2013-06-03 NOTE — Patient Instructions (Signed)
Please check your fasting blood sugars every day and bring your glucometer to your next office visit.

## 2013-06-03 NOTE — Progress Notes (Signed)
Subjective:   Patient ID: [name] male   DOB: Dec 31, 1975 38 y.o.   MRN: [id number]  HPI: [name] is a 39 y.o. gentleman with PMH significant for Poorly controlled DM-I with HbA1C of 12.8, HTN, H/O Recurrent skin infections/ cellulitis comes to the office for a follow up visit for his I&D.  Patient underwent I&D on 05/12/13 and has followed up with the clinic regularly for the dressing and INR checks. Patients girl friend has been doing the dressing for him and she denies any pus or induration at the site of injury.  Patient reports that he finished his 2 week course of the antibiotics. Patient forgot to bring his glucometer again and states that he lost his glucometer.   Patient denies any fever, chills or any other complaints.  Past Medical History  Diagnosis Date  . DVT (deep venous thrombosis) 09/2002    patient reports additional DVTs in '06 & '11 (unconfirmed)  . Pulmonary embolism 09/2002    treated with 6 months of warfarin  . Hypertension   . Type I diabetes mellitus 2001  . Chest pain, neg MI, normal coronaries by cath 02/18/2013  . Hyperlipidemia 02/19/2013  . S/P cardiac cath, 02/18/13, normal coronaries 02/19/2013  . CKD (chronic kidney disease) stage 3, GFR 30-59 ml/min 02/19/2013   Current Outpatient Prescriptions  Medication Sig Dispense Refill  . atorvastatin (LIPITOR) 10 MG tablet Take 1 tablet (10 mg total) by mouth daily at 6 PM.  30 tablet  6  . furosemide (LASIX) 40 MG tablet Take 1 tablet (40 mg total) by mouth daily.  30 tablet    . insulin NPH-regular Human (NOVOLIN 70/30) (70-30) 100 UNIT/ML injection Inject 35-50 Units into the skin 2 (two) times daily with a meal. 50 units in the morning and 35 units in the evening      . lisinopril-hydrochlorothiazide (PRINZIDE,ZESTORETIC) 20-25 MG per tablet Take 1 tablet by mouth daily.  30 tablet  6  . metFORMIN (GLUCOPHAGE) 500 MG tablet Take 2 tablets (1,000 mg total) by mouth 2 (two) times daily with a meal.   120 tablet  6  . pantoprazole (PROTONIX) 40 MG tablet Take 1 tablet (40 mg total) by mouth daily.  30 tablet  2  . promethazine (PHENERGAN) 25 MG tablet Take 0.5-1 tablets (12.5-25 mg total) by mouth every 6 (six) hours as needed for nausea or vomiting.  12 tablet  1  . warfarin (COUMADIN) 5 MG tablet Take 2.5 tablets (12.5 mg) all days of week EXCEPT on Tues/Fri--take only 1&1/2 tablets.  75 tablet  1   No current facility-administered medications for this visit.   Family History  Problem Relation Age of Onset  . Heart disease    . Diabetes    . Obesity     History   Social History  . Marital Status: Single    Spouse Name: N/A    Number of Children: N/A  . Years of Education: 15.5   Occupational History  . [employer]   . student   .     Social History Main Topics  . Smoking status: Never Smoker   . Smokeless tobacco: Never Used  . Alcohol Use: Yes     Comment: 02/18/2013 "a few beers q 6 months or so"  . Drug Use: No  . Sexual Activity: Yes   Other Topics Concern  . None   Social History Narrative   Financial assistance approved for 100% discount at [facility] and has [facility] card  per Bonna Gains   09/01/2009.      Lives in [place] with mother and sister.               Review of Systems: Pertinent items are noted in HPI. Objective:  Physical Exam: Filed Vitals:   06/03/13 1328  BP: 122/72  Pulse: 97  Temp: 98 F (36.7 C)  TempSrc: Oral  Height: 6\' 2"  (1.88 m)  Weight: 338 lb 3.2 oz (153.407 kg)  SpO2: 98%   Constitutional: Vital signs reviewed.  Patient is a obese, well-developed and well-nourished and is in no acute distress and cooperative with exam. Alert and oriented x3.  Head: Normocephalic and atraumatic  Cardiovascular: S1S2 normal. No murmurs heard.  Pulmonary/Chest: Normal vesicular breath sounds without any adventitious sounds.  Skin: Examination of the back: Wound site appears clean with out any surrounding erythema, induration or purulent  discharge. Wound looks dry and healthy. Wound and the surrounding area was cleaned with Betadine solution and betadine tip was inserted inside the wound. Band aid placed over the wound site. Neurological: A&O x3. Psychiatric: Normal mood and affect.    Assessment & Plan:

## 2013-06-03 NOTE — Assessment & Plan Note (Signed)
Well controlled.  Plans: Continue current regimen. 

## 2013-06-03 NOTE — Assessment & Plan Note (Signed)
Uncontrolled.  Patient does not bring his glucometer despite asking him at every recent office visits with me. Patient states he is taking 50 units in the AM and 35 units in the PM.  Plans: Educated patient regarding the importance of controlling his DM. Recommended patient to check his blood sugars daily and follow up in 2 weeks so we could adjust his insulin. Patients appears to be motivated and wants a 2 week follow up.

## 2013-06-04 ENCOUNTER — Ambulatory Visit (INDEPENDENT_AMBULATORY_CARE_PROVIDER_SITE_OTHER): Payer: Self-pay | Admitting: Pharmacist

## 2013-06-04 DIAGNOSIS — Z7901 Long term (current) use of anticoagulants: Secondary | ICD-10-CM

## 2013-06-04 DIAGNOSIS — Z86718 Personal history of other venous thrombosis and embolism: Secondary | ICD-10-CM

## 2013-06-04 DIAGNOSIS — I824Y9 Acute embolism and thrombosis of unspecified deep veins of unspecified proximal lower extremity: Secondary | ICD-10-CM

## 2013-06-04 LAB — POCT INR: INR: 3.7

## 2013-06-04 NOTE — Patient Instructions (Signed)
Patient instructed to take medications as defined in the Anti-coagulation Track section of this encounter.  Patient instructed to take today's dose.  Patient verbalized understanding of these instructions.    

## 2013-06-04 NOTE — Progress Notes (Signed)
Anti-Coagulation Progress Note  Dakin Bumpers is a 38 y.o. male who is currently on an anti-coagulation regimen.    RECENT RESULTS: Recent results are below, the most recent result is correlated with a dose of 62.5 mg. per week: Lab Results  Component Value Date   INR 3.70 06/04/2013   INR 2.90 05/26/2013   INR 2.2 05/14/2013    ANTI-COAG DOSE: Anticoagulation Dose Instructions as of 06/04/2013     [name] Tue Wed Thu Fri Sat   New Dose 10 mg 10 mg 7.5 mg 7.5 mg 7.5 mg 7.5 mg 7.5 mg       ANTICOAG SUMMARY: Anticoagulation Episode Summary   Current INR goal 2.0-3.0  Next INR check 06/16/2013  INR from last check 3.70! (06/04/2013)  Weekly max dose   Target end date   [facility]  Preferred lab   Send INR reminders to    Indications  DVT HX OF [V12.51] Acute venous embolism and thrombosis of deep vessels of proximal lower extremity [453.41] Long term (current) use of anticoagulants [V58.61]        Comments         ANTICOAG TODAY: Anticoagulation Summary as of 06/04/2013   INR goal 2.0-3.0  Selected INR 3.70! (06/04/2013)  Next INR check 06/16/2013  Target end date    Indications  DVT HX OF [V12.51] Acute venous embolism and thrombosis of deep vessels of proximal lower extremity [453.41] Long term (current) use of anticoagulants [V58.61]      Anticoagulation Episode Summary   [facility]   Preferred lab    Send INR reminders to    Comments       PATIENT INSTRUCTIONS: Patient Instructions  Patient instructed to take medications as defined in the Anti-coagulation Track section of this encounter.  Patient instructed to take today's dose.  Patient verbalized understanding of these instructions.       FOLLOW-UP Return in 12 days (on 06/16/2013) for Follow up INR at 1115h.  Jorene Guest, III Pharm.D., CACP

## 2013-06-06 NOTE — Telephone Encounter (Signed)
Patient will need an appointment with his PCP to discuss this. I do not believe that I have any appointments. Please schedule him for an appointment with his next PCP in July.

## 2013-06-07 NOTE — Progress Notes (Signed)
Case discussed with Dr. Boggala at time of visit.  We reviewed the resident's history and exam and pertinent patient test results.  I agree with the assessment, diagnosis, and plan of care documented in the resident's note. 

## 2013-06-09 NOTE — Telephone Encounter (Signed)
Forward to front desk pool for appt as directed.

## 2013-06-16 ENCOUNTER — Ambulatory Visit: Payer: Self-pay

## 2013-06-16 ENCOUNTER — Encounter: Payer: Self-pay | Admitting: Dietician

## 2013-07-03 NOTE — Telephone Encounter (Signed)
Appointment made with new PCP

## 2013-07-17 ENCOUNTER — Encounter: Payer: Self-pay | Admitting: Internal Medicine

## 2013-08-11 ENCOUNTER — Other Ambulatory Visit: Payer: Self-pay | Admitting: *Deleted

## 2013-08-11 DIAGNOSIS — E1065 Type 1 diabetes mellitus with hyperglycemia: Principal | ICD-10-CM

## 2013-08-11 DIAGNOSIS — IMO0002 Reserved for concepts with insufficient information to code with codable children: Secondary | ICD-10-CM

## 2013-08-11 DIAGNOSIS — E1051 Type 1 diabetes mellitus with diabetic peripheral angiopathy without gangrene: Secondary | ICD-10-CM

## 2013-08-11 NOTE — Telephone Encounter (Signed)
Pt has appt 8/6, he has several no shows/ cancelled appts, just a FYI

## 2013-08-12 NOTE — Telephone Encounter (Signed)
Do you know how many days left of pills he has, can he wait to get these refilled at his appointment?

## 2013-08-14 ENCOUNTER — Encounter: Payer: Self-pay | Admitting: Internal Medicine

## 2013-08-14 ENCOUNTER — Ambulatory Visit (INDEPENDENT_AMBULATORY_CARE_PROVIDER_SITE_OTHER): Payer: Self-pay | Admitting: Internal Medicine

## 2013-08-14 VITALS — BP 149/78 | HR 87 | Temp 97.9°F | Ht 74.0 in | Wt 353.7 lb

## 2013-08-14 DIAGNOSIS — E1051 Type 1 diabetes mellitus with diabetic peripheral angiopathy without gangrene: Secondary | ICD-10-CM

## 2013-08-14 DIAGNOSIS — K219 Gastro-esophageal reflux disease without esophagitis: Secondary | ICD-10-CM

## 2013-08-14 DIAGNOSIS — E1059 Type 1 diabetes mellitus with other circulatory complications: Secondary | ICD-10-CM

## 2013-08-14 DIAGNOSIS — N182 Chronic kidney disease, stage 2 (mild): Secondary | ICD-10-CM

## 2013-08-14 DIAGNOSIS — Z86718 Personal history of other venous thrombosis and embolism: Secondary | ICD-10-CM

## 2013-08-14 DIAGNOSIS — IMO0002 Reserved for concepts with insufficient information to code with codable children: Secondary | ICD-10-CM

## 2013-08-14 DIAGNOSIS — I10 Essential (primary) hypertension: Secondary | ICD-10-CM

## 2013-08-14 DIAGNOSIS — I798 Other disorders of arteries, arterioles and capillaries in diseases classified elsewhere: Secondary | ICD-10-CM

## 2013-08-14 DIAGNOSIS — E1065 Type 1 diabetes mellitus with hyperglycemia: Principal | ICD-10-CM

## 2013-08-14 DIAGNOSIS — Z7901 Long term (current) use of anticoagulants: Secondary | ICD-10-CM

## 2013-08-14 DIAGNOSIS — E785 Hyperlipidemia, unspecified: Secondary | ICD-10-CM

## 2013-08-14 LAB — GLUCOSE, CAPILLARY: Glucose-Capillary: 250 mg/dL — ABNORMAL HIGH (ref 70–99)

## 2013-08-14 LAB — POCT GLYCOSYLATED HEMOGLOBIN (HGB A1C): Hemoglobin A1C: 12.8

## 2013-08-14 MED ORDER — WARFARIN SODIUM 5 MG PO TABS: ORAL_TABLET | ORAL | Status: DC

## 2013-08-14 MED ORDER — LISINOPRIL-HYDROCHLOROTHIAZIDE 20-25 MG PO TABS: 1.0000 | ORAL_TABLET | Freq: Every day | ORAL | Status: DC

## 2013-08-14 MED ORDER — INSULIN NPH ISOPHANE & REGULAR (70-30) 100 UNIT/ML ~~LOC~~ SUSP: 35.0000 [IU] | Freq: Two times a day (BID) | SUBCUTANEOUS | Status: DC

## 2013-08-14 MED ORDER — RANITIDINE HCL 300 MG PO TABS: 300.0000 mg | ORAL_TABLET | Freq: Every day | ORAL | Status: DC

## 2013-08-14 MED ORDER — LOVASTATIN 20 MG PO TABS: 20.0000 mg | ORAL_TABLET | Freq: Every day | ORAL | Status: DC

## 2013-08-14 NOTE — Assessment & Plan Note (Signed)
On lifelong A/C with warfarin although he has been without this for a few months as he has not shown for INR.  Long term a NOAC may be a better option.

## 2013-08-14 NOTE — Progress Notes (Signed)
[facility] Subjective:   Patient ID: [name] male   DOB: November 12, 1975 38 y.o.   MRN: [id number]  HPI: [name] is a 38 y.o. male with a PMH significant for uncontrolled T1DM, HTN, HLD, CKD2-3, Venous Thromboembolism (on chronic Warfarin), multiple recent episodes of cellulitis/abscess. He returns today of a regular office visit.  HPI of chronic medical complaints is documented with problem based charting in the assessment/plan.  Patient notes that he lost his job ([employer]) and thus his insurance.  He reports cost of medications is very important to him right now.  He reports he has brought his financial documents and plants to meet with our financial counselor today after our visit.  He has not seen Dr. Elie Confer in a long time.  I have asked the front desk to make him an appointment with Dr. Elie Confer on Monday.  Past Medical History  Diagnosis Date  . DVT (deep venous thrombosis) 09/2002    patient reports additional DVTs in '06 & '11 (unconfirmed)  . Pulmonary embolism 09/2002    treated with 6 months of warfarin  . Hypertension   . Type I diabetes mellitus 2001  . Chest pain, neg MI, normal coronaries by cath 02/18/2013  . Hyperlipidemia 02/19/2013  . S/P cardiac cath, 02/18/13, normal coronaries 02/19/2013  . CKD (chronic kidney disease) stage 3, GFR 30-59 ml/min 02/19/2013   Current Outpatient Prescriptions  Medication Sig Dispense Refill  . furosemide (LASIX) 40 MG tablet Take 1 tablet (40 mg total) by mouth daily.  30 tablet    . insulin NPH-regular Human (NOVOLIN 70/30) (70-30) 100 UNIT/ML injection Inject 35-50 Units into the skin 2 (two) times daily with a meal. 50 units in the morning and 45 units in the evening  10 mL  11  . metFORMIN (GLUCOPHAGE) 500 MG tablet Take 2 tablets (1,000 mg total) by mouth 2 (two) times daily with a meal.  120 tablet  6  . lisinopril-hydrochlorothiazide (PRINZIDE,ZESTORETIC) 20-25 MG per tablet Take 1 tablet by mouth  daily.  30 tablet  6  . lovastatin (MEVACOR) 20 MG tablet Take 1 tablet (20 mg total) by mouth daily.  30 tablet  11  . ranitidine (ZANTAC) 300 MG tablet Take 1 tablet (300 mg total) by mouth at bedtime.  30 tablet  3  . warfarin (COUMADIN) 5 MG tablet Take 1.5 tablets (7.5 mg) all days of week EXCEPT on Sun/Mon-take 2 tablets.  15 tablet  0   No current facility-administered medications for this visit.   Family History  Problem Relation Age of Onset  . Heart disease    . Diabetes    . Obesity     History   Social History  . Marital Status: Single    Spouse Name: N/A    Number of Children: N/A  . Years of Education: 15.5   Occupational History  . [employer]   . student   .     Social History Main Topics  . Smoking status: Never Smoker   . Smokeless tobacco: Never Used  . Alcohol Use: Yes     Comment: 02/18/2013 "a few beers q 6 months or so"  . Drug Use: No  . Sexual Activity: Yes   Other Topics Concern  . None   Social History Narrative   Financial assistance approved for 100% discount at [facility] and has [facility] card per [employer]   09/01/2009.      Lives in [place] with mother and sister.  Review of Systems: Review of Systems  Constitutional: Negative for fever.  Cardiovascular: Negative for chest pain.  Gastrointestinal: Negative for diarrhea and constipation.  Genitourinary: Negative for dysuria.  Neurological: Negative for sensory change and focal weakness.  Psychiatric/Behavioral: Negative for depression.     Objective:  Physical Exam: Filed Vitals:   08/14/13 1410  BP: 149/78  Pulse: 87  Temp: 97.9 F (36.6 C)  TempSrc: Oral  Height: 6\' 2"  (1.88 m)  Weight: 353 lb 11.2 oz (160.437 kg)  SpO2: 100%  Physical Exam  Nursing note and vitals reviewed. Constitutional: He is oriented to person, place, and time and well-developed, well-nourished, and in no distress.  Morbid obesity  Cardiovascular: Normal rate and regular rhythm.    Pulmonary/Chest: Effort normal and breath sounds normal.  Musculoskeletal: He exhibits no edema.  Neurological: He is alert and oriented to person, place, and time. He displays normal reflexes. No cranial nerve deficit. Gait normal. Coordination normal.    Assessment & Plan:  Case discussed with Dr. Dareen Piano See Problem Based Assessment and Plan Medications Ordered Meds ordered this encounter  Medications  . insulin NPH-regular Human (NOVOLIN 70/30) (70-30) 100 UNIT/ML injection    Sig: Inject 35-50 Units into the skin 2 (two) times daily with a meal. 50 units in the morning and 45 units in the evening    Dispense:  10 mL    Refill:  11  . DISCONTD: lisinopril-hydrochlorothiazide (PRINZIDE,ZESTORETIC) 20-25 MG per tablet    Sig: Take 1 tablet by mouth daily.    Dispense:  30 tablet    Refill:  6  . warfarin (COUMADIN) 5 MG tablet    Sig: Take 1.5 tablets (7.5 mg) all days of week EXCEPT on Sun/Mon-take 2 tablets.    Dispense:  15 tablet    Refill:  0  . ranitidine (ZANTAC) 300 MG tablet    Sig: Take 1 tablet (300 mg total) by mouth at bedtime.    Dispense:  30 tablet    Refill:  3  . lovastatin (MEVACOR) 20 MG tablet    Sig: Take 1 tablet (20 mg total) by mouth daily.    Dispense:  30 tablet    Refill:  11  . lisinopril-hydrochlorothiazide (PRINZIDE,ZESTORETIC) 20-25 MG per tablet    Sig: Take 1 tablet by mouth daily.    Dispense:  30 tablet    Refill:  6   Other Orders Orders Placed This Encounter  Procedures  . Microalbumin / creatinine urine ratio  . Glucose, capillary  . POCT glycosylated hemoglobin (Hb A1C)  . HM DIABETES FOOT EXAM

## 2013-08-14 NOTE — Assessment & Plan Note (Signed)
BP Readings from Last 3 Encounters:  08/14/13 149/78  06/03/13 122/72  05/20/13 124/69    Lab Results  Component Value Date   NA 134* 02/19/2013   K 4.1 02/19/2013   CREATININE 1.59* 02/19/2013    Assessment: Blood pressure control: mildly elevated Progress toward BP goal:  deteriorated Comments: patient has run our of BP medication for last 2-3 days  Plan: Medications:  Continue Lisinopril HCTZ 20-25mg  daily Educational resources provided:   Self management tools provided:   Other plans:

## 2013-08-14 NOTE — Assessment & Plan Note (Signed)
Wt Readings from Last 5 Encounters:  08/14/13 353 lb 11.2 oz (160.437 kg)  06/03/13 338 lb 3.2 oz (153.407 kg)  05/20/13 340 lb (154.223 kg)  05/14/13 335 lb 1.6 oz (152 kg)  05/12/13 335 lb 9.6 oz (152.227 kg)   Patient has gained some weight, we discussed weight loss strategies.

## 2013-08-14 NOTE — Assessment & Plan Note (Signed)
I have held off on refilling his warfarin for the last few months as he has missed follow up visits with Dr. Elie Confer.  I have discussed again with him the dangers on unmonitored warfarin use.  I have given him a week supply of warfarin and instructed him to restart Dr. Gladstone Pih last instructions 7.5mg  daily except 10mg  on Sunday and Monday.  We will get him a follow up appointment with Dr. Elie Confer within the next week. In the future I feel he may be a good candidate for one of the NOAC but at this time he cannot afford these medications.

## 2013-08-14 NOTE — Assessment & Plan Note (Signed)
Patient has not been able to afford Protonix which previously controlled his heartburn.  I instructed him that Omeprazole is an OTC alternative but he is unsure if he will be able to afford that.   -Rx Rantidine 300mg  daily ($4 list)

## 2013-08-14 NOTE — Assessment & Plan Note (Signed)
Lab Results  Component Value Date   HGBA1C 12.8 08/14/2013   HGBA1C 12.8 05/12/2013   HGBA1C 10.4* 02/18/2013     HPI: Patient reports he has been taking Novolin 70/30 50 u in am and 35 u in PM.  He reports checking his CBG bid but does not bring his meter.  He notes highest reading was 461 and lowest reading was 180 that he can remember.  He reports most readings are in 250-350 range.  He has herd commercials on TV about new medications but I informed him these are for T2DM (although he likely has a component of insulin resistance) and are very expensive.  He reports he cannot afford to see his podiatrist.  He notes that he occasionally scrapes at calluses on his feet I strongly advised him not to do this and requested that he come in if he notices any wound on his feet immediately. Assessment: Diabetes control: poor control (HgbA1C >9%) Progress toward A1C goal:  unchanged Comments: Patient has lost job and insurance  Plan: Medications: Will increase Novolin 70/30 to 50 u in AM and 45 u in PM.  Will continue 1g metformin BID. Home glucose monitoring: Frequency: 2 times a day Timing: before breakfast;before dinner Instruction/counseling given: discussed foot care and discussed the need for weight loss Educational resources provided:   Self management tools provided:   Other plans:

## 2013-08-14 NOTE — Patient Instructions (Signed)
General Instructions: Increase 70/30 insulin to 50 units in morning and 45 units in evening.   Please bring your medicines with you each time you come to clinic.  Medicines may include prescription medications, over-the-counter medications, herbal remedies, eye drops, vitamins, or other pills.   Progress Toward Treatment Goals:  Treatment Goal 08/14/2013  Hemoglobin A1C unchanged  Blood pressure deteriorated    Self Care Goals & Plans:  Self Care Goal 08/14/2013  Manage my medications take my medicines as prescribed; bring my medications to every visit  Monitor my health keep track of my blood glucose; bring my glucose meter and log to each visit  Eat healthy foods eat more vegetables; eat fruit for snacks and desserts; eat baked foods instead of fried foods; eat foods that are low in salt; eat smaller portions; drink diet soda or water instead of juice or soda  Be physically active find an activity I enjoy; find workout friends    Home Blood Glucose Monitoring 08/14/2013  Check my blood sugar 2 times a day  When to check my blood sugar before breakfast; before dinner     Care Management & Community Referrals:  Referral 08/14/2013  Referrals made for care management support financial counselor

## 2013-08-14 NOTE — Assessment & Plan Note (Signed)
Patient's last LDL was 147 6 months ago.  He notes that he has not been taking Lipitor in some time.  He will not be able to afford this medication without insurance.  I have changed his medication to Lovastatin 20mg  which is on the 4 dollar list.  While I do not think this will be enough to get him to goal <70.  Unfortunately this is the best option at this time.  Patient agrees with plan.

## 2013-08-14 NOTE — Assessment & Plan Note (Signed)
Will continue Lisinopril, can recheck renal function at next visit.

## 2013-08-15 LAB — MICROALBUMIN / CREATININE URINE RATIO
CREATININE, URINE: 196.3 mg/dL
MICROALB/CREAT RATIO: 818.2 mg/g — AB (ref 0.0–30.0)
Microalb, Ur: 160.61 mg/dL — ABNORMAL HIGH (ref 0.00–1.89)

## 2013-08-18 ENCOUNTER — Ambulatory Visit: Payer: Self-pay

## 2013-08-19 NOTE — Progress Notes (Signed)
INTERNAL MEDICINE TEACHING ATTENDING ADDENDUM - Aldine Contes, MD: I reviewed and discussed at the time of visit with the resident Dr. Heber [place], the patient's medical history, physical examination, diagnosis and results of pertinent tests and treatment and I agree with the patient's care as documented.

## 2013-08-25 ENCOUNTER — Ambulatory Visit: Payer: Self-pay

## 2013-09-22 ENCOUNTER — Ambulatory Visit (INDEPENDENT_AMBULATORY_CARE_PROVIDER_SITE_OTHER): Payer: Medicaid Other | Admitting: Pharmacist

## 2013-09-22 DIAGNOSIS — Z86718 Personal history of other venous thrombosis and embolism: Secondary | ICD-10-CM | POA: Diagnosis not present

## 2013-09-22 DIAGNOSIS — Z7901 Long term (current) use of anticoagulants: Secondary | ICD-10-CM | POA: Diagnosis not present

## 2013-09-22 DIAGNOSIS — I824Y9 Acute embolism and thrombosis of unspecified deep veins of unspecified proximal lower extremity: Secondary | ICD-10-CM | POA: Diagnosis present

## 2013-09-22 LAB — POCT INR: INR: 0.9

## 2013-09-22 MED ORDER — WARFARIN SODIUM 5 MG PO TABS: ORAL_TABLET | ORAL | Status: DC

## 2013-09-22 NOTE — Progress Notes (Signed)
Anti-Coagulation Progress Note  [name] is a 38 y.o. male who is currently on an anti-coagulation regimen.    RECENT RESULTS: Recent results are below, the most recent result is correlated with a dose of ZERO mg. per week (patient has not had warfarin in 2 weeks--ran out of 15 tablet supply provided by PCP). Will get the new prescription filled and recommence at last documented regimen that has provided him in-range INR values: Lab Results  Component Value Date   INR 0.90 09/22/2013   INR 3.70 06/04/2013   INR 2.90 05/26/2013    ANTI-COAG DOSE: Anticoagulation Dose Instructions as of 09/22/2013     [name] Tue Wed Thu Fri Sat   New Dose 7.5 mg 10 mg 7.5 mg 7.5 mg 10 mg 7.5 mg 7.5 mg       ANTICOAG SUMMARY: Anticoagulation Episode Summary   Current INR goal 2.0-3.0  Next INR check 10/06/2013  INR from last check 0.90! (09/22/2013)  Weekly max dose   Target end date   [facility] check [facility]  Preferred lab   Send INR reminders to    Indications  DVT HX OF [V12.51] Acute venous embolism and thrombosis of deep vessels of proximal lower extremity [453.41] Long term (current) use of anticoagulants [V58.61]        Comments         ANTICOAG TODAY: Anticoagulation Summary as of 09/22/2013   INR goal 2.0-3.0  Selected INR 0.90! (09/22/2013)  Next INR check 10/06/2013  Target end date    Indications  DVT HX OF [V12.51] Acute venous embolism and thrombosis of deep vessels of proximal lower extremity [453.41] Long term (current) use of anticoagulants [V58.61]      Anticoagulation Episode Summary   [facility] check [facility]   Preferred lab    Send INR reminders to    Comments       PATIENT INSTRUCTIONS: Patient Instructions  Patient instructed to take medications as defined in the Anti-coagulation Track section of this encounter.  Patient instructed to take today's dose.  Patient verbalized understanding of these instructions.        FOLLOW-UP Return in 2 weeks (on 10/06/2013) for Follow up INR at 3:15PM.  Jorene Guest, III Pharm.D., CACP

## 2013-09-22 NOTE — Patient Instructions (Signed)
Patient instructed to take medications as defined in the Anti-coagulation Track section of this encounter.  Patient instructed to take today's dose.  Patient verbalized understanding of these instructions.    

## 2013-10-06 ENCOUNTER — Ambulatory Visit: Payer: Self-pay | Admitting: Internal Medicine

## 2013-10-06 ENCOUNTER — Ambulatory Visit: Payer: Self-pay

## 2013-10-13 NOTE — Progress Notes (Signed)
Patient is on anticoagulation for h/o VTE.  He was out of his coumadin and thus his INR was 0.9.  I have reviewed Dr. Gladstone Pih note.

## 2013-10-15 ENCOUNTER — Encounter: Payer: Self-pay | Admitting: *Deleted

## 2013-11-27 ENCOUNTER — Encounter: Payer: Self-pay | Admitting: Internal Medicine

## 2013-12-09 ENCOUNTER — Encounter: Payer: Self-pay | Admitting: Internal Medicine

## 2013-12-09 ENCOUNTER — Inpatient Hospital Stay (HOSPITAL_COMMUNITY): Payer: Medicaid Other

## 2013-12-09 ENCOUNTER — Ambulatory Visit (INDEPENDENT_AMBULATORY_CARE_PROVIDER_SITE_OTHER): Payer: Medicaid Other | Admitting: Internal Medicine

## 2013-12-09 ENCOUNTER — Inpatient Hospital Stay (HOSPITAL_COMMUNITY)
Admission: AD | Admit: 2013-12-09 | Discharge: 2013-12-26 | DRG: 853 | Disposition: A | Discharge status: Rehabilitation Facility | Payer: Medicaid Other | Source: Ambulatory Visit | Attending: Internal Medicine | Admitting: Internal Medicine

## 2013-12-09 ENCOUNTER — Encounter (HOSPITAL_COMMUNITY): Payer: Self-pay | Admitting: General Practice

## 2013-12-09 VITALS — BP 155/114 | HR 68 | Temp 99.1°F | Ht 74.0 in | Wt 338.3 lb

## 2013-12-09 DIAGNOSIS — D72829 Elevated white blood cell count, unspecified: Secondary | ICD-10-CM | POA: Diagnosis present

## 2013-12-09 DIAGNOSIS — Z7901 Long term (current) use of anticoagulants: Secondary | ICD-10-CM

## 2013-12-09 DIAGNOSIS — Z86718 Personal history of other venous thrombosis and embolism: Secondary | ICD-10-CM

## 2013-12-09 DIAGNOSIS — R140 Abdominal distension (gaseous): Secondary | ICD-10-CM

## 2013-12-09 DIAGNOSIS — N170 Acute kidney failure with tubular necrosis: Secondary | ICD-10-CM | POA: Diagnosis not present

## 2013-12-09 DIAGNOSIS — E11621 Type 2 diabetes mellitus with foot ulcer: Secondary | ICD-10-CM | POA: Diagnosis not present

## 2013-12-09 DIAGNOSIS — L02611 Cutaneous abscess of right foot: Secondary | ICD-10-CM | POA: Diagnosis present

## 2013-12-09 DIAGNOSIS — D62 Acute posthemorrhagic anemia: Secondary | ICD-10-CM | POA: Diagnosis present

## 2013-12-09 DIAGNOSIS — L02419 Cutaneous abscess of limb, unspecified: Secondary | ICD-10-CM | POA: Diagnosis present

## 2013-12-09 DIAGNOSIS — N183 Chronic kidney disease, stage 3 unspecified: Secondary | ICD-10-CM | POA: Diagnosis present

## 2013-12-09 DIAGNOSIS — N182 Chronic kidney disease, stage 2 (mild): Secondary | ICD-10-CM | POA: Diagnosis present

## 2013-12-09 DIAGNOSIS — Z9114 Patient's other noncompliance with medication regimen: Secondary | ICD-10-CM | POA: Diagnosis present

## 2013-12-09 DIAGNOSIS — L03119 Cellulitis of unspecified part of limb: Secondary | ICD-10-CM

## 2013-12-09 DIAGNOSIS — Z86711 Personal history of pulmonary embolism: Secondary | ICD-10-CM | POA: Diagnosis not present

## 2013-12-09 DIAGNOSIS — L97519 Non-pressure chronic ulcer of other part of right foot with unspecified severity: Secondary | ICD-10-CM

## 2013-12-09 DIAGNOSIS — R1110 Vomiting, unspecified: Secondary | ICD-10-CM

## 2013-12-09 DIAGNOSIS — B9561 Methicillin susceptible Staphylococcus aureus infection as the cause of diseases classified elsewhere: Secondary | ICD-10-CM | POA: Diagnosis present

## 2013-12-09 DIAGNOSIS — E1122 Type 2 diabetes mellitus with diabetic chronic kidney disease: Secondary | ICD-10-CM | POA: Diagnosis not present

## 2013-12-09 DIAGNOSIS — Y9289 Other specified places as the place of occurrence of the external cause: Secondary | ICD-10-CM

## 2013-12-09 DIAGNOSIS — N184 Chronic kidney disease, stage 4 (severe): Secondary | ICD-10-CM | POA: Diagnosis present

## 2013-12-09 DIAGNOSIS — Z794 Long term (current) use of insulin: Secondary | ICD-10-CM

## 2013-12-09 DIAGNOSIS — Z89511 Acquired absence of right leg below knee: Secondary | ICD-10-CM | POA: Diagnosis not present

## 2013-12-09 DIAGNOSIS — D638 Anemia in other chronic diseases classified elsewhere: Secondary | ICD-10-CM | POA: Diagnosis present

## 2013-12-09 DIAGNOSIS — L0231 Cutaneous abscess of buttock: Secondary | ICD-10-CM | POA: Diagnosis not present

## 2013-12-09 DIAGNOSIS — L02415 Cutaneous abscess of right lower limb: Secondary | ICD-10-CM | POA: Diagnosis not present

## 2013-12-09 DIAGNOSIS — R05 Cough: Secondary | ICD-10-CM

## 2013-12-09 DIAGNOSIS — L0291 Cutaneous abscess, unspecified: Secondary | ICD-10-CM | POA: Diagnosis present

## 2013-12-09 DIAGNOSIS — E860 Dehydration: Secondary | ICD-10-CM | POA: Diagnosis not present

## 2013-12-09 DIAGNOSIS — T458X5A Adverse effect of other primarily systemic and hematological agents, initial encounter: Secondary | ICD-10-CM | POA: Diagnosis not present

## 2013-12-09 DIAGNOSIS — I10 Essential (primary) hypertension: Secondary | ICD-10-CM | POA: Diagnosis not present

## 2013-12-09 DIAGNOSIS — E1065 Type 1 diabetes mellitus with hyperglycemia: Secondary | ICD-10-CM | POA: Diagnosis not present

## 2013-12-09 DIAGNOSIS — R509 Fever, unspecified: Secondary | ICD-10-CM

## 2013-12-09 DIAGNOSIS — E1052 Type 1 diabetes mellitus with diabetic peripheral angiopathy with gangrene: Secondary | ICD-10-CM | POA: Diagnosis present

## 2013-12-09 DIAGNOSIS — E1059 Type 1 diabetes mellitus with other circulatory complications: Secondary | ICD-10-CM

## 2013-12-09 DIAGNOSIS — R112 Nausea with vomiting, unspecified: Secondary | ICD-10-CM

## 2013-12-09 DIAGNOSIS — K219 Gastro-esophageal reflux disease without esophagitis: Secondary | ICD-10-CM | POA: Diagnosis present

## 2013-12-09 DIAGNOSIS — L0390 Cellulitis, unspecified: Secondary | ICD-10-CM

## 2013-12-09 DIAGNOSIS — N189 Chronic kidney disease, unspecified: Secondary | ICD-10-CM | POA: Diagnosis not present

## 2013-12-09 DIAGNOSIS — R502 Drug induced fever: Secondary | ICD-10-CM | POA: Diagnosis not present

## 2013-12-09 DIAGNOSIS — R197 Diarrhea, unspecified: Secondary | ICD-10-CM | POA: Diagnosis not present

## 2013-12-09 DIAGNOSIS — I129 Hypertensive chronic kidney disease with stage 1 through stage 4 chronic kidney disease, or unspecified chronic kidney disease: Secondary | ICD-10-CM | POA: Diagnosis present

## 2013-12-09 DIAGNOSIS — E1010 Type 1 diabetes mellitus with ketoacidosis without coma: Secondary | ICD-10-CM | POA: Diagnosis present

## 2013-12-09 DIAGNOSIS — F329 Major depressive disorder, single episode, unspecified: Secondary | ICD-10-CM | POA: Diagnosis present

## 2013-12-09 DIAGNOSIS — F419 Anxiety disorder, unspecified: Secondary | ICD-10-CM | POA: Diagnosis present

## 2013-12-09 DIAGNOSIS — J9811 Atelectasis: Secondary | ICD-10-CM | POA: Diagnosis not present

## 2013-12-09 DIAGNOSIS — L03115 Cellulitis of right lower limb: Secondary | ICD-10-CM | POA: Diagnosis present

## 2013-12-09 DIAGNOSIS — E1022 Type 1 diabetes mellitus with diabetic chronic kidney disease: Secondary | ICD-10-CM | POA: Diagnosis present

## 2013-12-09 DIAGNOSIS — Z9119 Patient's noncompliance with other medical treatment and regimen: Secondary | ICD-10-CM | POA: Diagnosis present

## 2013-12-09 DIAGNOSIS — N179 Acute kidney failure, unspecified: Secondary | ICD-10-CM | POA: Diagnosis not present

## 2013-12-09 DIAGNOSIS — A419 Sepsis, unspecified organism: Principal | ICD-10-CM | POA: Diagnosis present

## 2013-12-09 DIAGNOSIS — Z6841 Body Mass Index (BMI) 40.0 and over, adult: Secondary | ICD-10-CM | POA: Diagnosis not present

## 2013-12-09 DIAGNOSIS — E785 Hyperlipidemia, unspecified: Secondary | ICD-10-CM | POA: Diagnosis present

## 2013-12-09 DIAGNOSIS — R791 Abnormal coagulation profile: Secondary | ICD-10-CM | POA: Diagnosis present

## 2013-12-09 DIAGNOSIS — E119 Type 2 diabetes mellitus without complications: Secondary | ICD-10-CM | POA: Diagnosis present

## 2013-12-09 DIAGNOSIS — D509 Iron deficiency anemia, unspecified: Secondary | ICD-10-CM | POA: Diagnosis present

## 2013-12-09 DIAGNOSIS — E10621 Type 1 diabetes mellitus with foot ulcer: Secondary | ICD-10-CM

## 2013-12-09 DIAGNOSIS — B9562 Methicillin resistant Staphylococcus aureus infection as the cause of diseases classified elsewhere: Secondary | ICD-10-CM | POA: Diagnosis not present

## 2013-12-09 DIAGNOSIS — R059 Cough, unspecified: Secondary | ICD-10-CM

## 2013-12-09 HISTORY — DX: Gastro-esophageal reflux disease without esophagitis: K21.9

## 2013-12-09 HISTORY — DX: Nephrotic syndrome with unspecified morphologic changes: N04.9

## 2013-12-09 HISTORY — DX: Type 2 diabetes mellitus with foot ulcer: L97.519

## 2013-12-09 HISTORY — DX: Type 2 diabetes mellitus with foot ulcer: E11.621

## 2013-12-09 LAB — CBC WITH DIFFERENTIAL/PLATELET
Basophils Absolute: 0 10*3/uL (ref 0.0–0.1)
Basophils Relative: 0 % (ref 0–1)
Eosinophils Absolute: 0.2 10*3/uL (ref 0.0–0.7)
Eosinophils Relative: 1 % (ref 0–5)
HCT: 32.5 % — ABNORMAL LOW (ref 39.0–52.0)
Hemoglobin: 11 g/dL — ABNORMAL LOW (ref 13.0–17.0)
LYMPHS ABS: 1.7 10*3/uL (ref 0.7–4.0)
LYMPHS PCT: 11 % — AB (ref 12–46)
MCH: 28.4 pg (ref 26.0–34.0)
MCHC: 33.8 g/dL (ref 30.0–36.0)
MCV: 83.8 fL (ref 78.0–100.0)
MONO ABS: 1.5 10*3/uL — AB (ref 0.1–1.0)
MPV: 11.2 fL (ref 9.4–12.4)
Monocytes Relative: 10 % (ref 3–12)
NEUTROS ABS: 11.9 10*3/uL — AB (ref 1.7–7.7)
Neutrophils Relative %: 78 % — ABNORMAL HIGH (ref 43–77)
Platelets: 378 10*3/uL (ref 150–400)
RBC: 3.88 MIL/uL — AB (ref 4.22–5.81)
RDW: 12.8 % (ref 11.5–15.5)
WBC: 15.3 10*3/uL — AB (ref 4.0–10.5)

## 2013-12-09 LAB — COMPLETE METABOLIC PANEL WITH GFR
ALK PHOS: 188 U/L — AB (ref 39–117)
ALT: 21 U/L (ref 0–53)
AST: 20 U/L (ref 0–37)
Albumin: 2.7 g/dL — ABNORMAL LOW (ref 3.5–5.2)
BUN: 51 mg/dL — AB (ref 6–23)
CO2: 22 mEq/L (ref 19–32)
Calcium: 9.3 mg/dL (ref 8.4–10.5)
Chloride: 89 mEq/L — ABNORMAL LOW (ref 96–112)
Creat: 1.78 mg/dL — ABNORMAL HIGH (ref 0.50–1.35)
GFR, Est African American: 55 mL/min — ABNORMAL LOW
GFR, Est Non African American: 48 mL/min — ABNORMAL LOW
Glucose, Bld: 574 mg/dL (ref 70–99)
POTASSIUM: 4.7 meq/L (ref 3.5–5.3)
Sodium: 128 mEq/L — ABNORMAL LOW (ref 135–145)
Total Bilirubin: 1 mg/dL (ref 0.3–1.2)
Total Protein: 8.4 g/dL — ABNORMAL HIGH (ref 6.0–8.3)

## 2013-12-09 LAB — GLUCOSE, CAPILLARY
GLUCOSE-CAPILLARY: 562 mg/dL — AB (ref 70–99)
Glucose-Capillary: 463 mg/dL — ABNORMAL HIGH (ref 70–99)
Glucose-Capillary: 419 mg/dL — ABNORMAL HIGH (ref 70–99)
Glucose-Capillary: 385 mg/dL — ABNORMAL HIGH (ref 70–99)
Glucose-Capillary: 300 mg/dL — ABNORMAL HIGH (ref 70–99)

## 2013-12-09 LAB — PROTIME-INR
INR: 1.28 (ref 0.00–1.49)
Prothrombin Time: 16.1 seconds — ABNORMAL HIGH (ref 11.6–15.2)

## 2013-12-09 MED ORDER — WARFARIN SODIUM 10 MG PO TABS
10.0000 mg | ORAL_TABLET | Freq: Once | ORAL | Status: AC
  Administered 2013-12-09: 10 mg via ORAL
  Filled 2013-12-09: qty 1

## 2013-12-09 MED ORDER — CHLORHEXIDINE GLUCONATE 0.12 % MT SOLN
15.0000 mL | Freq: Two times a day (BID) | OROMUCOSAL | Status: DC
  Administered 2013-12-09 – 2013-12-25 (×19): 15 mL via OROMUCOSAL
  Filled 2013-12-09 (×21): qty 15

## 2013-12-09 MED ORDER — VANCOMYCIN HCL 10 G IV SOLR
1500.0000 mg | Freq: Two times a day (BID) | INTRAVENOUS | Status: DC
  Administered 2013-12-10 – 2013-12-12 (×5): 1500 mg via INTRAVENOUS
  Filled 2013-12-09 (×7): qty 1500

## 2013-12-09 MED ORDER — HYDROCHLOROTHIAZIDE 25 MG PO TABS: 25.0000 mg | ORAL_TABLET | Freq: Every day | ORAL | Status: DC

## 2013-12-09 MED ORDER — SODIUM CHLORIDE 0.9 % IV SOLN
INTRAVENOUS | Status: DC
  Administered 2013-12-09: 3.6 [IU]/h via INTRAVENOUS
  Filled 2013-12-09: qty 2.5

## 2013-12-09 MED ORDER — LISINOPRIL-HYDROCHLOROTHIAZIDE 20-25 MG PO TABS: 1.0000 | ORAL_TABLET | Freq: Every day | ORAL | Status: DC

## 2013-12-09 MED ORDER — INFLUENZA VAC SPLIT QUAD 0.5 ML IM SUSY
0.5000 mL | PREFILLED_SYRINGE | INTRAMUSCULAR | Status: AC
  Administered 2013-12-10: 0.5 mL via INTRAMUSCULAR
  Filled 2013-12-09: qty 0.5

## 2013-12-09 MED ORDER — ACETAMINOPHEN 325 MG PO TABS
650.0000 mg | ORAL_TABLET | Freq: Four times a day (QID) | ORAL | Status: DC | PRN
  Administered 2013-12-10 – 2013-12-13 (×2): 650 mg via ORAL
  Filled 2013-12-09 (×5): qty 2

## 2013-12-09 MED ORDER — WARFARIN - PHARMACIST DOSING INPATIENT: Freq: Every day | Status: DC

## 2013-12-09 MED ORDER — ACETAMINOPHEN 650 MG RE SUPP: 650.0000 mg | Freq: Four times a day (QID) | RECTAL | Status: DC | PRN

## 2013-12-09 MED ORDER — OXYCODONE-ACETAMINOPHEN 5-325 MG PO TABS
1.0000 | ORAL_TABLET | Freq: Four times a day (QID) | ORAL | Status: DC | PRN
  Administered 2013-12-09 – 2013-12-10 (×2): 1 via ORAL
  Filled 2013-12-09 (×3): qty 1

## 2013-12-09 MED ORDER — LISINOPRIL 20 MG PO TABS: 20.0000 mg | ORAL_TABLET | Freq: Every day | ORAL | Status: DC

## 2013-12-09 MED ORDER — DEXTROSE 50 % IV SOLN: 25.0000 mL | INTRAVENOUS | Status: DC | PRN

## 2013-12-09 MED ORDER — CETYLPYRIDINIUM CHLORIDE 0.05 % MT LIQD
7.0000 mL | Freq: Two times a day (BID) | OROMUCOSAL | Status: DC
  Administered 2013-12-10 – 2013-12-25 (×16): 7 mL via OROMUCOSAL

## 2013-12-09 MED ORDER — PRAVASTATIN SODIUM 20 MG PO TABS
20.0000 mg | ORAL_TABLET | Freq: Every day | ORAL | Status: DC
  Administered 2013-12-10 – 2013-12-25 (×13): 20 mg via ORAL
  Filled 2013-12-09 (×17): qty 1

## 2013-12-09 MED ORDER — DEXTROSE-NACL 5-0.45 % IV SOLN
INTRAVENOUS | Status: DC
  Administered 2013-12-10 (×2): via INTRAVENOUS

## 2013-12-09 MED ORDER — SODIUM CHLORIDE 0.9 % IV SOLN
INTRAVENOUS | Status: DC
  Administered 2013-12-11 – 2013-12-15 (×5): via INTRAVENOUS

## 2013-12-09 MED ORDER — SODIUM CHLORIDE 0.9 % IV SOLN
2500.0000 mg | Freq: Once | INTRAVENOUS | Status: AC
  Administered 2013-12-09: 2500 mg via INTRAVENOUS
  Filled 2013-12-09: qty 2500

## 2013-12-09 MED ORDER — PIPERACILLIN-TAZOBACTAM 3.375 G IVPB
3.3750 g | Freq: Three times a day (TID) | INTRAVENOUS | Status: DC
  Administered 2013-12-09 – 2013-12-17 (×22): 3.375 g via INTRAVENOUS
  Filled 2013-12-09 (×24): qty 50

## 2013-12-09 MED ORDER — SODIUM CHLORIDE 0.9 % IV SOLN
INTRAVENOUS | Status: DC
  Administered 2013-12-09: 21:00:00 via INTRAVENOUS

## 2013-12-09 NOTE — Patient Instructions (Signed)
General Instructions:   Please bring your medicines with you each time you come to clinic.  Medicines may include prescription medications, over-the-counter medications, herbal remedies, eye drops, vitamins, or other pills.   You will be admitted to the hospital from clinic for Cellulitis due to Diabetic Foot Ulcer.  Cellulitis Cellulitis is an infection of the skin and the tissue beneath it. The infected area is usually red and tender. Cellulitis occurs most often in the arms and lower legs.  CAUSES  Cellulitis is caused by bacteria that enter the skin through cracks or cuts in the skin. The most common types of bacteria that cause cellulitis are staphylococci and streptococci. SIGNS AND SYMPTOMS   Redness and warmth.  Swelling.  Tenderness or pain.  Fever. DIAGNOSIS  Your health care provider can usually determine what is wrong based on a physical exam. Blood tests may also be done. TREATMENT  Treatment usually involves taking an antibiotic medicine. HOME CARE INSTRUCTIONS   Take your antibiotic medicine as directed by your health care provider. Finish the antibiotic even if you start to feel better.  Keep the infected arm or leg elevated to reduce swelling.  Apply a warm cloth to the affected area up to 4 times per day to relieve pain.  Take medicines only as directed by your health care provider.  Keep all follow-up visits as directed by your health care provider. SEEK MEDICAL CARE IF:   You notice red streaks coming from the infected area.  Your red area gets larger or turns dark in color.  Your bone or joint underneath the infected area becomes painful after the skin has healed.  Your infection returns in the same area or another area.  You notice a swollen bump in the infected area.  You develop new symptoms.  You have a fever. SEEK IMMEDIATE MEDICAL CARE IF:   You feel very sleepy.  You develop vomiting or diarrhea.  You have a general ill feeling  (malaise) with muscle aches and pains. MAKE SURE YOU:   Understand these instructions.  Will watch your condition.  Will get help right away if you are not doing well or get worse. Document Released: 10/05/2004 Document Revised: 05/12/2013 Document Reviewed: 03/13/2011 [facility] Patient Information 2015 [place], [place]. This information is not intended to replace advice given to you by your health care provider. Make sure you discuss any questions you have with your health care provider.

## 2013-12-09 NOTE — Progress Notes (Signed)
ANTICOAGULATION CONSULT NOTE - Initial Consult  Pharmacy Consult for Warfarin Indication: DVT Hx of   No Known Allergies  Patient Measurements:   Total Body Weight 154kg  Vital Signs: Temp: 102 F (38.9 C) (12/01 2103) Temp Source: Oral (12/01 2103) BP: 129/62 mmHg (12/01 2103) Pulse Rate: 114 (12/01 2103)  Labs:  Recent Labs  12/09/13 1559 12/09/13 1948  HGB 11.0*  --   HCT 32.5*  --   PLT 378  --   LABPROT  --  16.1*  INR  --  1.28  CREATININE 1.78*  --     Estimated Creatinine Clearance: 89 mL/min (by C-G formula based on Cr of 1.78).   Medical History: Past Medical History  Diagnosis Date  . DVT (deep venous thrombosis) 09/2002    patient reports additional DVTs in '06 & '11 (unconfirmed)  . Pulmonary embolism 09/2002    treated with 6 months of warfarin  . Hypertension   . Chest pain, neg MI, normal coronaries by cath 02/18/2013  . Hyperlipidemia 02/19/2013  . Acute venous embolism and thrombosis of deep vessels of proximal lower extremity 07/19/2011  . Type I diabetes mellitus dx'd 2001  . GERD (gastroesophageal reflux disease)   . Diabetic ulcer of right foot   . CKD (chronic kidney disease) stage 3, GFR 30-59 ml/min 02/19/2013  . Nephrotic syndrome      Assessment: 38yom admitted for cellulitis.  Has Hx of recurrent DVT.  He is supposed to be on warfarin as outpt but is non-compliant.  Refer to clinic notes and IM admit note.  Per outpatient note last home regimen warfarin 7.5mg  daily and 10mg  Mon and Thur.   INR 1.28 on admission.  Will give a dose tonight.      Goal of Therapy:  INR 2-3 Monitor platelets by anticoagulation protocol: Yes   Plan:  Warfarin 10mg  x1 tonight  Daily Protime  Bonnita Nasuti Pharm.D. CPP, BCPS Clinical Pharmacist [phone] 12/09/2013 9:43 PM

## 2013-12-09 NOTE — Progress Notes (Signed)
Report called to [place] [facility] [address].  Pt has a 2 cm Callus/ wound on the bottom of right foot.  Small amount of blood tinged drainage.  Wrapped with 4x4 for transport.  Patient is alert and oriented accompanied by family member.  Transported via wheelchair to [facility].  Sander Nephew, RN 12/09/2013 4:49 PM.

## 2013-12-09 NOTE — H&P (Signed)
Date: 12/09/2013               Patient Name:  [name] MRN: [id number]  DOB: December 29, 1975 Age / Sex: 38 y.o., male   PCP: Lucious Groves, DO         Medical Service: Internal Medicine Teaching Service         Attending Physician: Dr. Madilyn Fireman, MD    First Contact: Dr. Randell Patient Pager: [phone]  Second Contact: Dr. Redmond Pulling Pager: [phone]       After Hours (After 5p/  First Contact Pager: [phone]  weekends / holidays): Second Contact Pager: [phone]   Chief Complaint: foot ulcer  History of Present Illness:   Patient is a 38 year old gentleman with a history of type 2 diabetes, obesity, hypertension, recurrent DVTs on chronic Coumadin therapy who presents with a foot ulcer. Patient states he had issues a lesion on his right foot for most a year. Patient has repeatedly shaved off the skin from this area. Most recently, patient shaved off an area of skin around a week ago from the area and then started noticing poor healing and ulceration in the area. Patient also reports that he has had some fevers and chills at home associated with this as well. Patient also reports associated nausea and vomiting during the same. Though has intermittently been able to eat some meals. Patient states that he has not been taking his metformin for a long time now and has missed several doses of his insulin in the last couple days. Patient otherwise denies any chest pain, dyspnea, abdominal pain, dysuria, hematuria, constipation, or diarrhea.  Patient was originally evaluated in outpatient clinic and admitted directly to our service.  Meds: Current Facility-Administered Medications  Medication Dose Route Frequency Provider Last Rate Last Dose  . 0.9 %  sodium chloride infusion   Intravenous Continuous Francesca Oman, DO      . 0.9 %  sodium chloride infusion   Intravenous Continuous Luan Moore, MD      . acetaminophen (TYLENOL) tablet 650 mg  650 mg Oral Q6H PRN Francesca Oman, DO       Or  .  acetaminophen (TYLENOL) suppository 650 mg  650 mg Rectal Q6H PRN Francesca Oman, DO      . Derrill Memo ON 12/10/2013] antiseptic oral rinse (CPC / CETYLPYRIDINIUM CHLORIDE 0.05%) solution 7 mL  7 mL Mouth Rinse q12n4p Madilyn Fireman, MD      . chlorhexidine (PERIDEX) 0.12 % solution 15 mL  15 mL Mouth Rinse BID Madilyn Fireman, MD      . dextrose 5 %-0.45 % sodium chloride infusion   Intravenous Continuous Luan Moore, MD      . dextrose 50 % solution 25 mL  25 mL Intravenous PRN Luan Moore, MD      . Derrill Memo ON 12/10/2013] Influenza vac split quadrivalent PF (FLUARIX) injection 0.5 mL  0.5 mL Intramuscular Tomorrow-1000 Madilyn Fireman, MD      . insulin regular (NOVOLIN R,HUMULIN R) 250 Units in sodium chloride 0.9 % 250 mL (1 Units/mL) infusion   Intravenous Continuous Luan Moore, MD      . piperacillin-tazobactam (ZOSYN) IVPB 3.375 g  3.375 g Intravenous 3 times per day Madilyn Fireman, MD      . Derrill Memo ON 12/10/2013] pravastatin (PRAVACHOL) tablet 20 mg  20 mg Oral q1800 Francesca Oman, DO      . [START ON 12/10/2013] vancomycin (VANCOCIN) 1,500 mg in sodium chloride 0.9 % 500 mL  IVPB  1,500 mg Intravenous Q12H Madilyn Fireman, MD      . vancomycin (VANCOCIN) 2,500 mg in sodium chloride 0.9 % 500 mL IVPB  2,500 mg Intravenous Once Madilyn Fireman, MD        Past Medical History  Diagnosis Date  . DVT (deep venous thrombosis) 09/2002    patient reports additional DVTs in '06 & '11 (unconfirmed)  . Pulmonary embolism 09/2002    treated with 6 months of warfarin  . Hypertension   . Chest pain, neg MI, normal coronaries by cath 02/18/2013  . Hyperlipidemia 02/19/2013  . Acute venous embolism and thrombosis of deep vessels of proximal lower extremity 07/19/2011  . Type I diabetes mellitus dx'd 2001  . GERD (gastroesophageal reflux disease)   . Diabetic ulcer of right foot   . CKD (chronic kidney disease) stage 3, GFR 30-59 ml/min 02/19/2013  . Nephrotic syndrome    Past Surgical History  Procedure  Laterality Date  . Incision and drainage abscess  2007; 2015    "back"  . Cardiac catheterization  02/18/2013    normal coronaries    Allergies: Allergies as of 12/09/2013  . (No Known Allergies)   Family History  Problem Relation Age of Onset  . Heart disease    . Diabetes    . Obesity     History   Social History  . Marital Status: Single    Spouse Name: N/A    Number of Children: N/A  . Years of Education: 15.5   Occupational History  . [employer]   . student   .     Social History Main Topics  . Smoking status: Never Smoker   . Smokeless tobacco: Never Used  . Alcohol Use: Yes     Comment: 12/09/2013 "a few beers q 6 months or so"  . Drug Use: No  . Sexual Activity: No   Other Topics Concern  . Not on file   Social History Narrative   Financial assistance approved for 100% discount at [facility] and has [facility] card per [employer]   09/01/2009.      Lives in [place] with mother and sister.                Review of Systems: All pertinent ROS has stated in HPI.   Physical Exam: Blood pressure 144/70, pulse 108, temperature 100.4 F (38 C), temperature source Oral, resp. rate 19, SpO2 96 %. General: resting in bed, in no acute distress HEENT: PERRL, EOMI, no scleral icterus Cardiac: Tachycardia, regular rhythm, no rubs, murmurs or gallops Pulm: clear to auscultation bilaterally, moving normal volumes of air Abd: soft, nontender, nondistended, BS present, obese Ext: 3 cm diameter area of ulceration in the right foot at with no ability to probe to the bone, no significant drainage, no significant surrounding erythema or tenderness, no crepitus, please see picture below, left foot normal Neuro: alert and oriented X3, cranial nerves II-XII grossly intact, strength 5 out of 5 in all 4 extremities, sensation intact to light touch throughout all extremities      Lab results: Basic Metabolic Panel:  Recent Labs  12/09/13 1559  NA 128*  K 4.7  CL 89*  CO2  22  GLUCOSE 574*  BUN 51*  CREATININE 1.78*  CALCIUM 9.3   Liver Function Tests:  Recent Labs  12/09/13 1559  AST 20  ALT 21  ALKPHOS 188*  BILITOT 1.0  PROT 8.4*  ALBUMIN 2.7*   No results for input(s): LIPASE,  AMYLASE in the last 72 hours. No results for input(s): AMMONIA in the last 72 hours. CBC:  Recent Labs  12/09/13 1559  WBC 15.3*  NEUTROABS 11.9*  HGB 11.0*  HCT 32.5*  MCV 83.8  PLT 378   Cardiac Enzymes: No results for input(s): CKTOTAL, CKMB, CKMBINDEX, TROPONINI in the last 72 hours. BNP: No results for input(s): PROBNP in the last 72 hours. D-Dimer: No results for input(s): DDIMER in the last 72 hours. CBG:  Recent Labs  12/09/13 1644 12/09/13 1726  GLUCAP 562* 463*   Hemoglobin A1C: No results for input(s): HGBA1C in the last 72 hours. Fasting Lipid Panel: No results for input(s): CHOL, HDL, LDLCALC, TRIG, CHOLHDL, LDLDIRECT in the last 72 hours. Thyroid Function Tests: No results for input(s): TSH, T4TOTAL, FREET4, T3FREE, THYROIDAB in the last 72 hours. Anemia Panel: No results for input(s): VITAMINB12, FOLATE, FERRITIN, TIBC, IRON, RETICCTPCT in the last 72 hours. Coagulation: No results for input(s): LABPROT, INR in the last 72 hours. Urine Drug Screen: Drugs of Abuse     Component Value Date/Time   LABOPIA POSITIVE* 07/12/2011 0038   COCAINSCRNUR NONE DETECTED 07/12/2011 0038   LABBENZ NONE DETECTED 07/12/2011 0038   AMPHETMU NONE DETECTED 07/12/2011 0038   THCU NONE DETECTED 07/12/2011 0038   LABBARB NONE DETECTED 07/12/2011 0038    Alcohol Level: No results for input(s): ETH in the last 72 hours. Urinalysis: No results for input(s): COLORURINE, LABSPEC, PHURINE, GLUCOSEU, HGBUR, BILIRUBINUR, KETONESUR, PROTEINUR, UROBILINOGEN, NITRITE, LEUKOCYTESUR in the last 72 hours.  Invalid input(s): APPERANCEUR   Imaging results:  No results found.   Assessment & Plan by Problem: Active Problems:   DM (diabetes mellitus) type I  uncontrolled, periph vascular disorder   DEEP VENOUS THROMBOPHLEBITIS, HX OF   Foot ulcer, right   Essential hypertension   Cellulitis  Patient is a 38 year old gentleman with a history of type 2 diabetes, obesity, hypertension, recurrent DVTs on chronic Coumadin therapy who presents with a diabetic foot ulcer along with some signs of DKA.   Diabetic foot ulcer: Patient presenting with a 3-4 cm ulcer that has worsened within the last week. Patient also reporting some fevers at home along with a fever of 100.4 in clinic. Patient was tachycardic though blood pressure is within normal limits. Leukocytosis of 15.3 with a left shift. There is no ability to probe to bone on physical exam.  - Vancomycin and Zosyn  - Blood cultures pending  - IV fluids 125 mL per hour per DKA protocol  - X-ray of foot and ankle  - Consider follow-up MRI depending on results of x-ray  Suspected Diabetic ketoacidosis secondary to type 2 diabetes: Anion gap of 17 and a blood glucose of 574. Patient states that he has had DKA in the past. Patient has missed several doses of insulin in the last several days and not taken his metformin for several months.  - Insulin drip  - Urinalysis pending  - IV fluids normal saline and 125 mL per hour with transition to D5 per protocol  - Every 2 hour basic metabolic panels  Hypertension: Blood pressure 144 over 70 in clinic.   - Holding home lisinopril hydrochlorothiazide and Lasix 40 mg given mild bump in creatinine to 1.78 up from 1.59 and fluid resuscitation.   Recurrent DVTs on chronic Coumadin therapy:  - Continue warfarin  Diet: NPO while on insulin drip Prophylaxis: On therapeutic Coumadin Code: Full code  Dispo: Disposition is deferred at this time, awaiting improvement of current  medical problems. Anticipated discharge in approximately 3 day(s).   The patient does have a current PCP Lucious Groves, DO) and does need an [facility] hospital follow-up appointment after  discharge.  The patient does not have transportation limitations that hinder transportation to clinic appointments.  Signed: Luan Moore, M.D., Ph.D. Internal Medicine Teaching Service, PGY-1 12/09/2013, 7:29 PM

## 2013-12-09 NOTE — Progress Notes (Signed)
ANTIBIOTIC CONSULT NOTE - INITIAL  Pharmacy Consult for Vancomycin / Zosyn Indication: heel cellulitis  No Known Allergies  Patient Measurements:   Actual Body Weight: 154 kg  Vital Signs: Temp: 99.1 F (37.3 C) (12/01 1544) Temp Source: Oral (12/01 1544) BP: 155/114 mmHg (12/01 1544) Pulse Rate: 68 (12/01 1544) Intake/Output from previous day:   Intake/Output from this shift:    Labs:  Recent Labs  12/09/13 1559  WBC 15.3*  HGB 11.0*  PLT 378  CREATININE 1.78*   Estimated Creatinine Clearance: 89 mL/min (by C-G formula based on Cr of 1.78). No results for input(s): VANCOTROUGH, VANCOPEAK, VANCORANDOM, GENTTROUGH, GENTPEAK, GENTRANDOM, TOBRATROUGH, TOBRAPEAK, TOBRARND, AMIKACINPEAK, AMIKACINTROU, AMIKACIN in the last 72 hours.   Microbiology: No results found for this or any previous visit (from the past 720 hour(s)).  Medical History: Past Medical History  Diagnosis Date  . DVT (deep venous thrombosis) 09/2002    patient reports additional DVTs in '06 & '11 (unconfirmed)  . Pulmonary embolism 09/2002    treated with 6 months of warfarin  . Hypertension   . Type I diabetes mellitus 2001  . Chest pain, neg MI, normal coronaries by cath 02/18/2013  . Hyperlipidemia 02/19/2013  . S/P cardiac cath, 02/18/13, normal coronaries 02/19/2013  . CKD (chronic kidney disease) stage 3, GFR 30-59 ml/min 02/19/2013  . Acute venous embolism and thrombosis of deep vessels of proximal lower extremity 07/19/2011     Assessment: 38yof admitted with R heel cellulitis.  Hx uncontrolled DM.  WBC 15, Tm 100.4, renal fx 1.79 slightly elevated from BL 1.5.    Goal of Therapy:  Vancomycin trough level 10-15 mcg/ml  Plan:  Zosyn 3.375gm IV q8 EI Vancomycin 2.5gm IV x1 then 1500mg  IV q12hr  Bonnita Nasuti Pharm.D. CPP, BCPS Clinical Pharmacist [phone] 12/09/2013 6:57 PM

## 2013-12-09 NOTE — Assessment & Plan Note (Signed)
Assessment: Diabetic foot ulcer with evidence of cellulitis, chills, fever, nausea and bilious vomiting.  Plan: -Admit to hospital -Recommend IV antibiotics; consider Vanc/Zosyn for MRSA coverage -Blood cultures x 2 -CBC w Diff -BMET -Consider imaging of right foot for osteomyelitis versus abscess -Consider consultation to orthopedic surgery

## 2013-12-09 NOTE — Progress Notes (Signed)
Subjective:     Patient ID: [name], male   DOB: Jun 14, 1975, 38 y.o.   MRN: [id number]  HPI [name] is a 38 yo male with PMHx of Type II DM, HTN, CKD stage II, DVT/PE in 2004, and repeat DVT in 2011 who presents to the clinic with right foot pain, nausea, vomiting, and fever/chills. Please see problem oriented assessment and plan for more information.  Review of Systems General: Admits to fever and chills, Denies fatigue, change in appetite and diaphoresis.  Respiratory: Denies SOB, cough, DOE, chest tightness, and wheezing.   Cardiovascular: Denies chest pain and palpitations.  Gastrointestinal: Admits to nausea, vomiting, Denies abdominal pain, diarrhea, constipation, blood in stool and abdominal distention.  Genitourinary: Denies dysuria, urgency, frequency, hematuria, suprapubic pain and flank pain. Endocrine: Denies hot or cold intolerance, polyuria, and polydipsia. Musculoskeletal: Admits to right foot ulcer with drainage, and right extremity pain, erythema and tenderness. Denies myalgias, back pain, joint swelling, arthralgias and gait problem.  Skin: Denies pallor, rash and wounds.  Neurological: Denies dizziness, headaches, weakness, lightheadedness, numbness,seizures, and syncope, Psychiatric/Behavioral: Denies mood changes, confusion, nervousness, sleep disturbance and agitation.     Objective:   Physical Exam Filed Vitals:   12/09/13 1544  BP: 155/114  Pulse: 68  Temp: 99.1 F (37.3 C)  TempSrc: Oral  Height: 6\' 2"  (1.88 m)  Weight: 338 lb 4.8 oz (153.452 kg)  SpO2: 99%   General: Vital signs reviewed.  Patient is well-developed and well-nourished, in moderate acute distress and cooperative with exam.  Cardiovascular: RRR, S1 normal, S2 normal, no murmurs, gallops, or rubs. Pulmonary/Chest: Clear to auscultation bilaterally, no wheezes, rales, or rhonchi. Abdominal: Bilious vomiting. Soft, non-tender, non-distended, BS +, no masses, organomegaly, or guarding  present.  Musculoskeletal: No joint deformities, erythema, or stiffness, ROM full and nontender. Extremities: Right foot ulcer 1 cm x 1 cm without evidence of purulence but with bleeding. No obvious fluctuance. Increased edema in right lower extremity. Right lower extremity is warm to touch and exquisitely tender to palpation.  Skin: Warm, dry and intact. No rashes or erythema. Psychiatric: Normal mood and affect. speech and behavior is normal. Cognition and memory are normal.      Assessment:         Plan:     Please see problem based assessment and plan.

## 2013-12-10 ENCOUNTER — Inpatient Hospital Stay (HOSPITAL_COMMUNITY): Payer: Medicaid Other

## 2013-12-10 DIAGNOSIS — N179 Acute kidney failure, unspecified: Secondary | ICD-10-CM

## 2013-12-10 DIAGNOSIS — N189 Chronic kidney disease, unspecified: Secondary | ICD-10-CM

## 2013-12-10 LAB — BASIC METABOLIC PANEL
Anion gap: 14 (ref 5–15)
Anion gap: 13 (ref 5–15)
BUN: 53 mg/dL — AB (ref 6–23)
BUN: 55 mg/dL — AB (ref 6–23)
CHLORIDE: 96 meq/L (ref 96–112)
CHLORIDE: 97 meq/L (ref 96–112)
CO2: 24 meq/L (ref 19–32)
CO2: 24 meq/L (ref 19–32)
CREATININE: 2.21 mg/dL — AB (ref 0.50–1.35)
CREATININE: 2.36 mg/dL — AB (ref 0.50–1.35)
Calcium: 9.3 mg/dL (ref 8.4–10.5)
Calcium: 9 mg/dL (ref 8.4–10.5)
GFR calc Af Amer: 42 mL/min — ABNORMAL LOW (ref >90)
GFR calc Af Amer: 39 mL/min — ABNORMAL LOW (ref >90)
GFR calc non Af Amer: 36 mL/min — ABNORMAL LOW (ref >90)
GFR calc non Af Amer: 33 mL/min — ABNORMAL LOW (ref >90)
Glucose, Bld: 284 mg/dL — ABNORMAL HIGH (ref 70–99)
Glucose, Bld: 185 mg/dL — ABNORMAL HIGH (ref 70–99)
Potassium: 4.6 mEq/L (ref 3.7–5.3)
Potassium: 4.4 mEq/L (ref 3.7–5.3)
Sodium: 134 mEq/L — ABNORMAL LOW (ref 137–147)
Sodium: 134 mEq/L — ABNORMAL LOW (ref 137–147)

## 2013-12-10 LAB — CBC
HCT: 27.1 % — ABNORMAL LOW (ref 39.0–52.0)
HEMOGLOBIN: 8.9 g/dL — AB (ref 13.0–17.0)
MCH: 26.8 pg (ref 26.0–34.0)
MCHC: 32.8 g/dL (ref 30.0–36.0)
MCV: 81.6 fL (ref 78.0–100.0)
PLATELETS: 368 10*3/uL (ref 150–400)
RBC: 3.32 MIL/uL — ABNORMAL LOW (ref 4.22–5.81)
RDW: 12.9 % (ref 11.5–15.5)
WBC: 17.9 10*3/uL — ABNORMAL HIGH (ref 4.0–10.5)

## 2013-12-10 LAB — PROTIME-INR
INR: 1.38 (ref 0.00–1.49)
PROTHROMBIN TIME: 17.1 s — AB (ref 11.6–15.2)

## 2013-12-10 LAB — COMPREHENSIVE METABOLIC PANEL
ALBUMIN: 2.4 g/dL — AB (ref 3.5–5.2)
ALT: 17 U/L (ref 0–53)
AST: 13 U/L (ref 0–37)
Alkaline Phosphatase: 140 U/L — ABNORMAL HIGH (ref 39–117)
Anion gap: 12 (ref 5–15)
BUN: 55 mg/dL — ABNORMAL HIGH (ref 6–23)
CO2: 25 meq/L (ref 19–32)
Calcium: 9 mg/dL (ref 8.4–10.5)
Chloride: 99 mEq/L (ref 96–112)
Creatinine, Ser: 2.41 mg/dL — ABNORMAL HIGH (ref 0.50–1.35)
GFR calc Af Amer: 38 mL/min — ABNORMAL LOW (ref >90)
GFR, EST NON AFRICAN AMERICAN: 33 mL/min — AB (ref >90)
Glucose, Bld: 162 mg/dL — ABNORMAL HIGH (ref 70–99)
Potassium: 4.3 mEq/L (ref 3.7–5.3)
SODIUM: 136 meq/L — AB (ref 137–147)
Total Bilirubin: 0.8 mg/dL (ref 0.3–1.2)
Total Protein: 7.2 g/dL (ref 6.0–8.3)

## 2013-12-10 LAB — GLUCOSE, CAPILLARY
GLUCOSE-CAPILLARY: 135 mg/dL — AB (ref 70–99)
GLUCOSE-CAPILLARY: 140 mg/dL — AB (ref 70–99)
GLUCOSE-CAPILLARY: 148 mg/dL — AB (ref 70–99)
GLUCOSE-CAPILLARY: 194 mg/dL — AB (ref 70–99)
GLUCOSE-CAPILLARY: 241 mg/dL — AB (ref 70–99)
GLUCOSE-CAPILLARY: 257 mg/dL — AB (ref 70–99)
Glucose-Capillary: 161 mg/dL — ABNORMAL HIGH (ref 70–99)
Glucose-Capillary: 160 mg/dL — ABNORMAL HIGH (ref 70–99)
Glucose-Capillary: 153 mg/dL — ABNORMAL HIGH (ref 70–99)
Glucose-Capillary: 151 mg/dL — ABNORMAL HIGH (ref 70–99)
Glucose-Capillary: 137 mg/dL — ABNORMAL HIGH (ref 70–99)
Glucose-Capillary: 173 mg/dL — ABNORMAL HIGH (ref 70–99)
Glucose-Capillary: 185 mg/dL — ABNORMAL HIGH (ref 70–99)
Glucose-Capillary: 205 mg/dL — ABNORMAL HIGH (ref 70–99)

## 2013-12-10 MED ORDER — INSULIN ASPART PROT & ASPART (70-30 MIX) 100 UNIT/ML ~~LOC~~ SUSP
35.0000 [IU] | Freq: Two times a day (BID) | SUBCUTANEOUS | Status: DC
  Administered 2013-12-10 (×2): 35 [IU] via SUBCUTANEOUS

## 2013-12-10 MED ORDER — PRO-STAT SUGAR FREE PO LIQD
30.0000 mL | Freq: Three times a day (TID) | ORAL | Status: DC
  Administered 2013-12-10: 30 mL via ORAL
  Administered 2013-12-11: 09:00:00 via ORAL
  Administered 2013-12-11 – 2013-12-14 (×6): 30 mL via ORAL
  Filled 2013-12-10 (×18): qty 30

## 2013-12-10 MED ORDER — OXYCODONE-ACETAMINOPHEN 5-325 MG PO TABS
1.0000 | ORAL_TABLET | Freq: Four times a day (QID) | ORAL | Status: DC | PRN
  Administered 2013-12-10: 1 via ORAL
  Administered 2013-12-10 – 2013-12-12 (×4): 2 via ORAL
  Filled 2013-12-10 (×7): qty 2

## 2013-12-10 MED ORDER — SODIUM CHLORIDE 0.9 % IV SOLN
INTRAVENOUS | Status: DC
  Filled 2013-12-10: qty 2.5

## 2013-12-10 MED ORDER — INSULIN ASPART 100 UNIT/ML ~~LOC~~ SOLN
0.0000 [IU] | Freq: Three times a day (TID) | SUBCUTANEOUS | Status: DC
  Administered 2013-12-10: 8 [IU] via SUBCUTANEOUS
  Administered 2013-12-11: 5 [IU] via SUBCUTANEOUS
  Administered 2013-12-11: 3 [IU] via SUBCUTANEOUS
  Administered 2013-12-11 – 2013-12-12 (×4): 5 [IU] via SUBCUTANEOUS
  Administered 2013-12-13 (×2): 15 [IU] via SUBCUTANEOUS
  Administered 2013-12-13: 8 [IU] via SUBCUTANEOUS
  Administered 2013-12-14 (×3): 2 [IU] via SUBCUTANEOUS
  Administered 2013-12-15 (×2): 3 [IU] via SUBCUTANEOUS
  Administered 2013-12-15: 2 [IU] via SUBCUTANEOUS
  Administered 2013-12-16 – 2013-12-20 (×3): 3 [IU] via SUBCUTANEOUS
  Administered 2013-12-20 (×2): 5 [IU] via SUBCUTANEOUS
  Administered 2013-12-21 (×3): 8 [IU] via SUBCUTANEOUS
  Administered 2013-12-22: 5 [IU] via SUBCUTANEOUS
  Administered 2013-12-22: 8 [IU] via SUBCUTANEOUS
  Administered 2013-12-23 (×3): 5 [IU] via SUBCUTANEOUS
  Administered 2013-12-24: 2 [IU] via SUBCUTANEOUS
  Administered 2013-12-24 (×2): 3 [IU] via SUBCUTANEOUS
  Administered 2013-12-25: 2 [IU] via SUBCUTANEOUS

## 2013-12-10 MED ORDER — INSULIN ASPART PROT & ASPART (70-30 MIX) 100 UNIT/ML ~~LOC~~ SUSP
35.0000 [IU] | Freq: Two times a day (BID) | SUBCUTANEOUS | Status: DC
  Filled 2013-12-10: qty 10

## 2013-12-10 MED ORDER — WARFARIN SODIUM 10 MG PO TABS
10.0000 mg | ORAL_TABLET | Freq: Once | ORAL | Status: AC
  Administered 2013-12-10: 10 mg via ORAL
  Filled 2013-12-10: qty 1

## 2013-12-10 NOTE — Progress Notes (Signed)
ANTICOAGULATION CONSULT NOTE - Initial Consult  Pharmacy Consult for Warfarin Indication: DVT Hx of   No Known Allergies  Patient Measurements: Height: 6\' 2"  (188 cm) Weight: (!) 323 lb 4.8 oz (146.648 kg) IBW/kg (Calculated) : 82.2 Total Body Weight 154kg  Vital Signs: Temp: 100 F (37.8 C) (12/02 0419) Temp Source: Oral (12/02 0419) BP: 126/65 mmHg (12/02 0419) Pulse Rate: 99 (12/02 0419)  Labs:  Recent Labs  12/09/13 1559 12/09/13 1948 12/09/13 2310 12/10/13 0100 12/10/13 0252 12/10/13 0258  HGB 11.0*  --   --   --   --  8.9*  HCT 32.5*  --   --   --   --  27.1*  PLT 378  --   --   --   --  368  LABPROT  --  16.1*  --   --  17.1*  --   INR  --  1.28  --   --  1.38  --   CREATININE 1.78*  --  2.21* 2.36*  --  2.41*    Estimated Creatinine Clearance: 64.1 mL/min (by C-G formula based on Cr of 2.41).   Medical History: Past Medical History  Diagnosis Date  . DVT (deep venous thrombosis) 09/2002    patient reports additional DVTs in '06 & '11 (unconfirmed)  . Pulmonary embolism 09/2002    treated with 6 months of warfarin  . Hypertension   . Chest pain, neg MI, normal coronaries by cath 02/18/2013  . Hyperlipidemia 02/19/2013  . Acute venous embolism and thrombosis of deep vessels of proximal lower extremity 07/19/2011  . Type I diabetes mellitus dx'd 2001  . GERD (gastroesophageal reflux disease)   . Diabetic ulcer of right foot   . CKD (chronic kidney disease) stage 3, GFR 30-59 ml/min 02/19/2013  . Nephrotic syndrome      Assessment: 38yom admitted for cellulitis.  Has Hx of recurrent DVT.  He is supposed to be on warfarin as outpt but is non-compliant. INR 1.28 on admission. INR has trended up to 1.38 today. Hgb 8.9, plt wnl.   Home dose: Per outpatient note last home regimen warfarin 7.5mg  daily and 10mg  Mon and Thur.        Goal of Therapy:  INR 2-3 Monitor platelets by anticoagulation protocol: Yes   Plan:  Repeat warfarin 10mg  x1 tonight  Daily  Protime  Albertina Parr, PharmD., BCPS Clinical Pharmacist Pager [phone]

## 2013-12-10 NOTE — Progress Notes (Signed)
Inpatient Diabetes Program Recommendations  AACE/ADA: New Consensus Statement on Inpatient Glycemic Control (2013)  Target Ranges:  Prepandial:   less than 140 mg/dL      Peak postprandial:   less than 180 mg/dL (1-2 hours)      Critically ill patients:  140 - 180 mg/dL   This coordinator spoke with the patient via phone concerning elevated glucose and foot ulcer.  Pt states he has difficulty affording his insulin and admits to missing doses because of it.  Case management has been consulted to assist with medication assistance.  No questions/concerns at the end of our conversation but told pt I would visit tomorrow to follow-up and see if he has questions/concerns.   Will follow. Thank you  Raoul Pitch BSN, RN,CDE Inpatient Diabetes Coordinator [phone] (team pager)

## 2013-12-10 NOTE — Consult Note (Addendum)
WOC wound consult note Reason for Consult: Consult requested for left foot wound.  Pt states wound has been present several weeks but had increased odor and drainage this week. Wound type: Full thickness to plantar anterior foot. Measurement: .4X.4X.2cm Wound bed: red moist wound bed, no bone when probed, fluctuant and painful. Drainage (amount, consistency, odor) Mod amt tan-pink drainage, no odor Periwound: Old dry blood surrounding edge of wound to .2cm.  Erythremia and edema to 2 cm surrounding wound edges.  This might indicate deeper abscess.  MRI is pending. Dressing procedure/placement/frequency: Moist gauze to facilitate drainage.  There is no depth to pack.  If MRI indicates osteomyelitis or abscess which would require drainage, please consult ortho service. Please re-consult if further assistance is needed.  Thank-you,  Julien Girt MSN, [place], [place], [place], [place]

## 2013-12-10 NOTE — Plan of Care (Signed)
Problem: Food- and Nutrition-Related Knowledge Deficit (NB-1.1) Goal: Nutrition education Formal process to instruct or train a patient/client in a skill or to impart knowledge to help patients/clients voluntarily manage or modify food choices and eating behavior to maintain or improve health. Outcome: Completed/Met Date Met:  12/10/13  RD consulted for nutrition education regarding diabetes.     Lab Results  Component Value Date    HGBA1C 12.8 08/14/2013    RD provided "Carbohydrate Counting for People with Diabetes" handout from the Academy of Nutrition and Dietetics. Discussed different food groups and their effects on blood sugar, emphasizing carbohydrate-containing foods. Provided list of carbohydrates and recommended serving sizes of common foods.  Discussed importance of controlled and consistent carbohydrate intake throughout the day. Provided examples of ways to balance meals/snacks and encouraged intake of high-fiber, whole grain complex carbohydrates. Teach back method used.  Expect good compliance.  Body mass index is 41.49 kg/(m^2). Pt meets criteria for morbid obesity based on current BMI.  Current diet order is carb modified, patient is consuming approximately 100% of meals at this time. Labs and medications reviewed. No further nutrition interventions warranted at this time. RD contact information provided. If additional nutrition issues arise, please re-consult RD.  Laurette Schimke MS, RD, LDN

## 2013-12-10 NOTE — Progress Notes (Signed)
MD gave verbal orders over telephone to discontinue glucose stabilizer/insulin drip.  Drip stopped and discarded.  Most recent blood sugar was 185.  MD stated they will place sliding scale orders shortly. Graceann Congress

## 2013-12-10 NOTE — Care Management Note (Signed)
  Page 1 of 1   12/10/2013     3:36:25 PM CARE MANAGEMENT NOTE 12/10/2013  Patient:  DAJEAN, GOCHEZ   Account Number:  [id number]  Date Initiated:  12/10/2013  Documentation initiated by:  Magdalen Spatz  Subjective/Objective Assessment:     Action/Plan:   Anticipated DC Date:  12/11/2013   Anticipated DC Plan:  [place] [facility]  Medication Assistance      Choice offered to / List presented to:             Status of service:   Medicare Important Message given?   (If response is "NO", the following Medicare IM given date fields will be blank) Date Medicare IM given:   Medicare IM given by:   Date Additional Medicare IM given:   Additional Medicare IM given by:    Discharge Disposition:    Per UR Regulation:    If discussed at Long Length of Stay Meetings, dates discussed:    Comments:  12-10-13 Spoke to patient regarding medication assistance . [place] letter provided and explained . Patient voiced understanding ( used one in 2/14) . Patient is followed by [facility] , he does not have orange card , states there is a doument he is missing. Provided information on [place] and [place] in case patient interested in future .  Magdalen Spatz RN BSN [phone]

## 2013-12-10 NOTE — Progress Notes (Signed)
INITIAL NUTRITION ASSESSMENT  DOCUMENTATION CODES Per approved criteria  -Morbid Obesity   INTERVENTION: - Prostat TID  NUTRITION DIAGNOSIS: Increased nutrient needs related to wound healing as evidenced by diabetic ulcer on foot.   Goal: Pt to meet >/= 90% of their estimated nutrition needs   Monitor:  Weight trend, po intake, acceptance of supplements, labs  Reason for Assessment: MST  38 y.o. male  Admitting Dx: <principal problem not specified>  ASSESSMENT: 38 year old african [place] gentleman with type 2 diabetes on insulin, obesity, recurrent DVT on chronic coumadin presenting with a chronic (1 year) non-healing right foot plantar ulcer   - Pt reports that his appetite has been poor for the past week. Appetite has improved today and pt ate almost 100% of his lunch. Pt reports that he has had diabetic education in the past. RD provided pt with additional education and handouts using teach-back method.  - No signs of fat or muscle wasting.   Height: Ht Readings from Last 1 Encounters:  12/10/13 6\' 2"  (1.88 m)    Weight: Wt Readings from Last 1 Encounters:  12/10/13 323 lb 4.8 oz (146.648 kg)    Ideal Body Weight: 82.2 kg  % Ideal Body Weight: 178%  Wt Readings from Last 10 Encounters:  12/10/13 323 lb 4.8 oz (146.648 kg)  12/09/13 338 lb 4.8 oz (153.452 kg)  08/14/13 353 lb 11.2 oz (160.437 kg)  06/03/13 338 lb 3.2 oz (153.407 kg)  05/20/13 340 lb (154.223 kg)  05/14/13 335 lb 1.6 oz (152 kg)  05/12/13 335 lb 9.6 oz (152.227 kg)  04/01/13 346 lb 4.8 oz (157.081 kg)  12/24/12 371 lb 11.2 oz (168.602 kg)  10/02/12 357 lb 12.8 oz (162.297 kg)    BMI:  Body mass index is 41.49 kg/(m^2).  Estimated Nutritional Needs: Kcal: 2100-2300 Protein: 160-170 g Fluid: 2.1-2.3 L/day  Skin: diabetic ulcer on foot  Diet Order: Diet Carb Modified  EDUCATION NEEDS: -Education needs addressed   Intake/Output Summary (Last 24 hours) at 12/10/13 1522 Last data  filed at 12/10/13 0540  Gross per 24 hour  Intake 1686.79 ml  Output      0 ml  Net 1686.79 ml    Last BM: prior to admission   Labs:   Recent Labs Lab 12/09/13 2310 12/10/13 0100 12/10/13 0258  NA 134* 134* 136*  K 4.6 4.4 4.3  CL 96 97 99  CO2 24 24 25   BUN 53* 55* 55*  CREATININE 2.21* 2.36* 2.41*  CALCIUM 9.3 9.0 9.0  GLUCOSE 284* 185* 162*    CBG (last 3)   Recent Labs  12/10/13 0940 12/10/13 1046 12/10/13 1202  GLUCAP 140* 148* 173*    Scheduled Meds: . antiseptic oral rinse  7 mL Mouth Rinse q12n4p  . chlorhexidine  15 mL Mouth Rinse BID  . insulin aspart  0-15 Units Subcutaneous TID WC  . insulin aspart protamine- aspart  35 Units Subcutaneous BID WC  . piperacillin-tazobactam (ZOSYN)  IV  3.375 g Intravenous 3 times per day  . pravastatin  20 mg Oral q1800  . vancomycin  1,500 mg Intravenous Q12H  . warfarin  10 mg Oral ONCE-1800  . Warfarin - Pharmacist Dosing Inpatient   Does not apply q1800    Continuous Infusions: . sodium chloride    . sodium chloride Stopped (12/10/13 0008)  . dextrose 5 % and 0.45% NaCl 125 mL/hr at 12/10/13 1009    Past Medical History  Diagnosis Date  . DVT (  deep venous thrombosis) 09/2002    patient reports additional DVTs in '06 & '11 (unconfirmed)  . Pulmonary embolism 09/2002    treated with 6 months of warfarin  . Hypertension   . Chest pain, neg MI, normal coronaries by cath 02/18/2013  . Hyperlipidemia 02/19/2013  . Acute venous embolism and thrombosis of deep vessels of proximal lower extremity 07/19/2011  . Type I diabetes mellitus dx'd 2001  . GERD (gastroesophageal reflux disease)   . Diabetic ulcer of right foot   . CKD (chronic kidney disease) stage 3, GFR 30-59 ml/min 02/19/2013  . Nephrotic syndrome     Past Surgical History  Procedure Laterality Date  . Incision and drainage abscess  2007; 2015    "back"  . Cardiac catheterization  02/18/2013    normal coronaries    Laurette Schimke MS, RD,  LDN

## 2013-12-10 NOTE — Progress Notes (Signed)
Subjective: Doing well this morning. Reports some R foot pain. A little nausea but no emesis. Denies chest pain or shortness of breath.  Objective: Vital signs in last 24 hours: Filed Vitals:   12/09/13 2103 12/10/13 0325 12/10/13 0419 12/10/13 1132  BP: 129/62  126/65 129/63  Pulse: 114  99 102  Temp: 102 F (38.9 C) 99 F (37.2 C) 100 F (37.8 C) 98.1 F (36.7 C)  TempSrc: Oral Oral Oral Oral  Resp: 18  18 16   Height:   6\' 2"  (1.88 m)   Weight:   323 lb 4.8 oz (146.648 kg)   SpO2: 98%  100% 97%   Weight change:   Intake/Output Summary (Last 24 hours) at 12/10/13 1346 Last data filed at 12/10/13 0540  Gross per 24 hour  Intake 1686.79 ml  Output      0 ml  Net 1686.79 ml   General: NAD HEENT: PERRL, EOMI, no scleral icterus Cardiac: RRR, no rubs, murmurs or gallops Pulm: clear to auscultation bilaterally, moving normal volumes of air Abd: soft, nontender, nondistended Ext: 3 cm diameter area of ulceration in the right foot at with no ability to probe to the bone, no significant drainage, no significant surrounding erythema or tenderness, no crepitus, left foot normal. Palpable DP pulses bilaterally. Neuro: alert and oriented X3, cranial nerves II-XII grossly intact, strength 5 out of 5 in all 4 extremities, sensation intact to light touch throughout all extremities  Lab Results: Basic Metabolic Panel:  Recent Labs Lab 12/10/13 0100 12/10/13 0258  NA 134* 136*  K 4.4 4.3  CL 97 99  CO2 24 25  GLUCOSE 185* 162*  BUN 55* 55*  CREATININE 2.36* 2.41*  CALCIUM 9.0 9.0   Liver Function Tests:  Recent Labs Lab 12/09/13 1559 12/10/13 0258  AST 20 13  ALT 21 17  ALKPHOS 188* 140*  BILITOT 1.0 0.8  PROT 8.4* 7.2  ALBUMIN 2.7* 2.4*   CBC:  Recent Labs Lab 12/09/13 1559 12/10/13 0258  WBC 15.3* 17.9*  NEUTROABS 11.9*  --   HGB 11.0* 8.9*  HCT 32.5* 27.1*  MCV 83.8 81.6  PLT 378 368   CBG:  Recent Labs Lab 12/10/13 0621 12/10/13 0728  12/10/13 0829 12/10/13 0940 12/10/13 1046 12/10/13 1202  GLUCAP 153* 151* 137* 140* 148* 173*   Coagulation:  Recent Labs Lab 12/09/13 1948 12/10/13 0252  LABPROT 16.1* 17.1*  INR 1.28 1.38   Urine Drug Screen: Drugs of Abuse     Component Value Date/Time   LABOPIA POSITIVE* 07/12/2011 0038   COCAINSCRNUR NONE DETECTED 07/12/2011 0038   LABBENZ NONE DETECTED 07/12/2011 0038   AMPHETMU NONE DETECTED 07/12/2011 0038   THCU NONE DETECTED 07/12/2011 0038   LABBARB NONE DETECTED 07/12/2011 0038    Micro Results: Recent Results (from the past 240 hour(s))  Culture, Blood     Status: None (Preliminary result)   Collection Time: 12/09/13  4:28 PM  Result Value Ref Range Status   Preliminary Report Blood Culture received; No Growth to date;  Preliminary   Preliminary Report Culture will be held for 5 days before issuing  Preliminary   Preliminary Report a Final Negative report.  Preliminary  Culture, Blood     Status: None (Preliminary result)   Collection Time: 12/09/13  4:44 PM  Result Value Ref Range Status   Preliminary Report Blood Culture received; No Growth to date;  Preliminary   Preliminary Report Culture will be held for 5 days before issuing  Preliminary   Preliminary Report a Final Negative report.  Preliminary   Studies/Results: Dg Foot 2 Views Right  12/10/2013   CLINICAL DATA:  Diabetic ulceration on the plantar surface of the right foot for the past year, recently worsening. Initial encounter.  EXAM: RIGHT FOOT - 2 VIEW  COMPARISON:  MRI of the right foot performed 02/10/2012, and right foot radiographs performed 02/09/2012  FINDINGS: There is no evidence of fracture or dislocation. The joint spaces are preserved. There is no evidence of talar subluxation; the subtalar joint is unremarkable in appearance. A posterior calcaneal spur is noted. Mild spurring is noted dorsally at the midfoot.  The known soft tissue ulceration is not well characterized on radiograph. No  radiopaque foreign bodies are seen. There is no evidence of osseous erosion.  IMPRESSION: No radiopaque foreign bodies seen. No evidence of osseous erosion to suggest osteomyelitis. No evidence of fracture or dislocation.   Electronically Signed   By: Garald Balding M.D.   On: 12/10/2013 02:05   Medications: I have reviewed the patient's current medications. Scheduled Meds: . antiseptic oral rinse  7 mL Mouth Rinse q12n4p  . chlorhexidine  15 mL Mouth Rinse BID  . insulin aspart  0-15 Units Subcutaneous TID WC  . insulin aspart protamine- aspart  35 Units Subcutaneous BID WC  . piperacillin-tazobactam (ZOSYN)  IV  3.375 g Intravenous 3 times per day  . pravastatin  20 mg Oral q1800  . vancomycin  1,500 mg Intravenous Q12H  . warfarin  10 mg Oral ONCE-1800  . Warfarin - Pharmacist Dosing Inpatient   Does not apply q1800   Continuous Infusions: . sodium chloride    . sodium chloride Stopped (12/10/13 0008)  . dextrose 5 % and 0.45% NaCl 125 mL/hr at 12/10/13 1009   PRN Meds:.acetaminophen **OR** acetaminophen, dextrose, oxyCODONE-acetaminophen Assessment/Plan: Active Problems:   DM (diabetes mellitus) type I uncontrolled, periph vascular disorder   DEEP VENOUS THROMBOPHLEBITIS, HX OF   Foot ulcer, right   Essential hypertension   Cellulitis  Diabetic foot ulcer: XR right foot 2 v 12/09/2013 with no foreign bodies, no evidence of erosion, no fx or dislocation. BCx 12/09/2013 NGTD - Continue 1500mg  IV Vancomycin Q12hr and 3.375g IV Zosyn Q8hr - IV fluids 125 mL per hour - MR R foot w/o contrast pending  - Percocet 5/325mg  1-2 tab Q6hr prn pain  Suspected Diabetic ketoacidosis secondary to type 2 diabetes: Anion gap 13 and 12 this morning. - d/c Insulin drip - Urinalysis pending  - SSI mod, CBG q AC/HS  - Novolog 70/30 25u BID  AKI on CKD stage 2: Cr continues to trend upward from 2.36 to 2.41 (baseline around 1.6)  - Continue IV fluids  - Avoid renal toxins  Hypertension:    - Holding home lisinopril-hydrochlorothiazide and Lasix 40 mg  Recurrent DVTs on chronic Coumadin therapy: - Continue warfarin  FEN:  - carb modified diet  DVT ppx: Coumadin  Dispo: Disposition is deferred at this time, awaiting improvement of current medical problems.  Anticipated discharge in approximately 1-2 day(s).   The patient does have a current PCP Lucious Groves, DO) and does need an [facility] hospital follow-up appointment after discharge.  The patient does not have transportation limitations that hinder transportation to clinic appointments.  .Services Needed at time of discharge: Y = Yes, Blank = No PT:   OT:   RN:   Equipment:   Other:     LOS: 1 day   Jacques Earthly, MD  12/10/2013, 1:46 PM

## 2013-12-11 LAB — PROTIME-INR
INR: 1.76 — ABNORMAL HIGH (ref 0.00–1.49)
Prothrombin Time: 20.7 seconds — ABNORMAL HIGH (ref 11.6–15.2)

## 2013-12-11 LAB — BASIC METABOLIC PANEL
Anion gap: 13 (ref 5–15)
BUN: 54 mg/dL — AB (ref 6–23)
CALCIUM: 8.6 mg/dL (ref 8.4–10.5)
CO2: 24 meq/L (ref 19–32)
Chloride: 95 mEq/L — ABNORMAL LOW (ref 96–112)
Creatinine, Ser: 2.04 mg/dL — ABNORMAL HIGH (ref 0.50–1.35)
GFR calc Af Amer: 46 mL/min — ABNORMAL LOW (ref >90)
GFR calc non Af Amer: 40 mL/min — ABNORMAL LOW (ref >90)
GLUCOSE: 144 mg/dL — AB (ref 70–99)
Potassium: 4 mEq/L (ref 3.7–5.3)
SODIUM: 132 meq/L — AB (ref 137–147)

## 2013-12-11 LAB — CBC
HEMATOCRIT: 27.2 % — AB (ref 39.0–52.0)
HEMOGLOBIN: 8.6 g/dL — AB (ref 13.0–17.0)
MCH: 26.5 pg (ref 26.0–34.0)
MCHC: 31.6 g/dL (ref 30.0–36.0)
MCV: 84 fL (ref 78.0–100.0)
Platelets: 384 10*3/uL (ref 150–400)
RBC: 3.24 MIL/uL — ABNORMAL LOW (ref 4.22–5.81)
RDW: 13.2 % (ref 11.5–15.5)
WBC: 15.5 10*3/uL — ABNORMAL HIGH (ref 4.0–10.5)

## 2013-12-11 LAB — URINALYSIS, ROUTINE W REFLEX MICROSCOPIC
GLUCOSE, UA: 500 mg/dL — AB
Ketones, ur: 15 mg/dL — AB
Leukocytes, UA: NEGATIVE
NITRITE: NEGATIVE
Protein, ur: >300 mg/dL — AB
Specific Gravity, Urine: 1.024 (ref 1.005–1.030)
Urobilinogen, UA: 2 mg/dL — ABNORMAL HIGH (ref 0.0–1.0)
pH: 5 (ref 5.0–8.0)

## 2013-12-11 LAB — GLUCOSE, CAPILLARY
GLUCOSE-CAPILLARY: 139 mg/dL — AB (ref 70–99)
Glucose-Capillary: 189 mg/dL — ABNORMAL HIGH (ref 70–99)
Glucose-Capillary: 239 mg/dL — ABNORMAL HIGH (ref 70–99)
Glucose-Capillary: 215 mg/dL — ABNORMAL HIGH (ref 70–99)

## 2013-12-11 LAB — URINE MICROSCOPIC-ADD ON

## 2013-12-11 MED ORDER — LIDOCAINE HCL (CARDIAC) 20 MG/ML IV SOLN
INTRAVENOUS | Status: AC
  Filled 2013-12-11: qty 5

## 2013-12-11 MED ORDER — LIDOCAINE HCL (PF) 2 % IJ SOLN
5.0000 mL | Freq: Once | INTRAMUSCULAR | Status: AC
Start: 2013-12-11 — End: 2013-12-11
  Administered 2013-12-11: 5 mL via INTRADERMAL
  Filled 2013-12-11: qty 10

## 2013-12-11 MED ORDER — INSULIN ASPART PROT & ASPART (70-30 MIX) 100 UNIT/ML ~~LOC~~ SUSP
35.0000 [IU] | Freq: Two times a day (BID) | SUBCUTANEOUS | Status: DC
  Administered 2013-12-11: 35 [IU] via SUBCUTANEOUS

## 2013-12-11 MED ORDER — INSULIN ASPART PROT & ASPART (70-30 MIX) 100 UNIT/ML ~~LOC~~ SUSP
15.0000 [IU] | Freq: Two times a day (BID) | SUBCUTANEOUS | Status: DC
Start: 2013-12-11 — End: 2013-12-11

## 2013-12-11 NOTE — Consult Note (Signed)
WOC follow-up: MRI indicates abscess.   This is beyond scope of practice for [place] nurse.  Please consult ortho service for further input. Please re-consult if further assistance is needed.  Thank-you,  Julien Girt MSN, [place], [place], [place], [place]

## 2013-12-11 NOTE — Progress Notes (Signed)
Inpatient Diabetes Program Recommendations  AACE/ADA: New Consensus Statement on Inpatient Glycemic Control (2013)  Target Ranges:  Prepandial:   less than 140 mg/dL      Peak postprandial:   less than 180 mg/dL (1-2 hours)      Critically ill patients:  140 - 180 mg/dL   Home dose 70/30: 50 units in the am 45 units ac supper for total of 95 units/day.  63 units of that being the NPH/basal portion.  Here 70/30 insulin ordered at 35 units bid yesterday for total of  70 units, 46 units of that being the basal/NPH portion.-glucose still elevated.   Today order decreased to 15 units bid, a total of 30 units, 20 units of that being the basal/NPH portion. Pt at 334 lbs (152 kg).   Pt most probably needs at least 40 units basal insulin (considering his renal status). Might be helpful to use levemir and novolog to control his glucose levels while here rather than lowering the 70/30 total daily dose (esp if NPO status).   Otherwise, would recommend ordering the 70/30 at a minimal dose of 30 units bid.   Thank you, Rosita Kea, RN, CNS, Diabetes Coordinator [phone])

## 2013-12-11 NOTE — Consult Note (Addendum)
Reason for Consult:  Right foot wound Referring Physician:  Dr. Roxy Manns XXX Lege is an 38 y.o. male.  HPI: 38 y/o male with [place] of diabetes and DVT c/o right foot wound for the last several months.  He was seeing a podiatrist until he lost his insurance.  He is admitted now for poor healing of the ulcer with recent chills and fever.  He c/o pain in both feet that is nearly constant.  His foot hurts worse when he walks on it and is better with elevation.  INR this morning is 1.76.  No h/o amputation.  He is not a smoker.  On vanc and zosyn since admission.  Past Medical History  Diagnosis Date  . DVT (deep venous thrombosis) 09/2002    patient reports additional DVTs in '06 & '11 (unconfirmed)  . Pulmonary embolism 09/2002    treated with 6 months of warfarin  . Hypertension   . Chest pain, neg MI, normal coronaries by cath 02/18/2013  . Hyperlipidemia 02/19/2013  . Acute venous embolism and thrombosis of deep vessels of proximal lower extremity 07/19/2011  . Type I diabetes mellitus dx'd 2001  . GERD (gastroesophageal reflux disease)   . Diabetic ulcer of right foot   . CKD (chronic kidney disease) stage 3, GFR 30-59 ml/min 02/19/2013  . Nephrotic syndrome     Past Surgical History  Procedure Laterality Date  . Incision and drainage abscess  2007; 2015    "back"  . Cardiac catheterization  02/18/2013    normal coronaries    Family History  Problem Relation Age of Onset  . Heart disease    . Diabetes    . Obesity      Social History:  reports that he has never smoked. He has never used smokeless tobacco. He reports that he drinks alcohol. He reports that he does not use illicit drugs.  Allergies: No Known Allergies  Medications: I have reviewed the patient's current medications.  Results for orders placed or performed during the hospital encounter of 12/09/13 (from the past 48 hour(s))  Glucose, capillary     Status: Abnormal   Collection Time: 12/09/13  5:26 PM  Result  Value Ref Range   Glucose-Capillary 463 (H) 70 - 99 mg/dL  Protime-INR     Status: Abnormal   Collection Time: 12/09/13  7:48 PM  Result Value Ref Range   Prothrombin Time 16.1 (H) 11.6 - 15.2 seconds   INR 1.28 0.00 - 1.49  Glucose, capillary     Status: Abnormal   Collection Time: 12/09/13  8:27 PM  Result Value Ref Range   Glucose-Capillary 419 (H) 70 - 99 mg/dL   Comment 1 Notify RN   Glucose, capillary     Status: Abnormal   Collection Time: 12/09/13  9:43 PM  Result Value Ref Range   Glucose-Capillary 385 (H) 70 - 99 mg/dL  Glucose, capillary     Status: Abnormal   Collection Time: 12/09/13 10:56 PM  Result Value Ref Range   Glucose-Capillary 300 (H) 70 - 99 mg/dL  Basic metabolic panel     Status: Abnormal   Collection Time: 12/09/13 11:10 PM  Result Value Ref Range   Sodium 134 (L) 137 - 147 mEq/L   Potassium 4.6 3.7 - 5.3 mEq/L   Chloride 96 96 - 112 mEq/L   CO2 24 19 - 32 mEq/L   Glucose, Bld 284 (H) 70 - 99 mg/dL   BUN 53 (H) 6 - 23 mg/dL  Creatinine, Ser 2.21 (H) 0.50 - 1.35 mg/dL   Calcium 9.3 8.4 - 10.5 mg/dL   GFR calc non Af Amer 36 (L) >90 mL/min   GFR calc Af Amer 42 (L) >90 mL/min    Comment: (NOTE) The eGFR has been calculated using the CKD EPI equation. This calculation has not been validated in all clinical situations. eGFR's persistently <90 mL/min signify possible Chronic Kidney Disease.    Anion gap 14 5 - 15  Glucose, capillary     Status: Abnormal   Collection Time: 12/10/13 12:04 AM  Result Value Ref Range   Glucose-Capillary 241 (H) 70 - 99 mg/dL  Basic metabolic panel     Status: Abnormal   Collection Time: 12/10/13  1:00 AM  Result Value Ref Range   Sodium 134 (L) 137 - 147 mEq/L   Potassium 4.4 3.7 - 5.3 mEq/L   Chloride 97 96 - 112 mEq/L   CO2 24 19 - 32 mEq/L   Glucose, Bld 185 (H) 70 - 99 mg/dL   BUN 55 (H) 6 - 23 mg/dL   Creatinine, Ser 2.36 (H) 0.50 - 1.35 mg/dL   Calcium 9.0 8.4 - 10.5 mg/dL   GFR calc non Af Amer 33 (L)  >90 mL/min   GFR calc Af Amer 39 (L) >90 mL/min    Comment: (NOTE) The eGFR has been calculated using the CKD EPI equation. This calculation has not been validated in all clinical situations. eGFR's persistently <90 mL/min signify possible Chronic Kidney Disease.    Anion gap 13 5 - 15  Glucose, capillary     Status: Abnormal   Collection Time: 12/10/13  1:39 AM  Result Value Ref Range   Glucose-Capillary 194 (H) 70 - 99 mg/dL   Comment 1 Notify RN   Protime-INR     Status: Abnormal   Collection Time: 12/10/13  2:52 AM  Result Value Ref Range   Prothrombin Time 17.1 (H) 11.6 - 15.2 seconds   INR 1.38 0.00 - 1.49  Comprehensive metabolic panel     Status: Abnormal   Collection Time: 12/10/13  2:58 AM  Result Value Ref Range   Sodium 136 (L) 137 - 147 mEq/L   Potassium 4.3 3.7 - 5.3 mEq/L   Chloride 99 96 - 112 mEq/L   CO2 25 19 - 32 mEq/L   Glucose, Bld 162 (H) 70 - 99 mg/dL   BUN 55 (H) 6 - 23 mg/dL   Creatinine, Ser 2.41 (H) 0.50 - 1.35 mg/dL   Calcium 9.0 8.4 - 10.5 mg/dL   Total Protein 7.2 6.0 - 8.3 g/dL   Albumin 2.4 (L) 3.5 - 5.2 g/dL   AST 13 0 - 37 U/L   ALT 17 0 - 53 U/L   Alkaline Phosphatase 140 (H) 39 - 117 U/L   Total Bilirubin 0.8 0.3 - 1.2 mg/dL   GFR calc non Af Amer 33 (L) >90 mL/min   GFR calc Af Amer 38 (L) >90 mL/min    Comment: (NOTE) The eGFR has been calculated using the CKD EPI equation. This calculation has not been validated in all clinical situations. eGFR's persistently <90 mL/min signify possible Chronic Kidney Disease.    Anion gap 12 5 - 15  CBC     Status: Abnormal   Collection Time: 12/10/13  2:58 AM  Result Value Ref Range   WBC 17.9 (H) 4.0 - 10.5 K/uL   RBC 3.32 (L) 4.22 - 5.81 MIL/uL   Hemoglobin 8.9 (L) 13.0 -  17.0 g/dL   HCT 27.1 (L) 39.0 - 52.0 %   MCV 81.6 78.0 - 100.0 fL   MCH 26.8 26.0 - 34.0 pg   MCHC 32.8 30.0 - 36.0 g/dL   RDW 12.9 11.5 - 15.5 %   Platelets 368 150 - 400 K/uL  Glucose, capillary     Status:  Abnormal   Collection Time: 12/10/13  3:07 AM  Result Value Ref Range   Glucose-Capillary 161 (H) 70 - 99 mg/dL  Glucose, capillary     Status: Abnormal   Collection Time: 12/10/13  4:15 AM  Result Value Ref Range   Glucose-Capillary 135 (H) 70 - 99 mg/dL   Comment 1 Notify RN   Glucose, capillary     Status: Abnormal   Collection Time: 12/10/13  5:21 AM  Result Value Ref Range   Glucose-Capillary 160 (H) 70 - 99 mg/dL  Glucose, capillary     Status: Abnormal   Collection Time: 12/10/13  6:21 AM  Result Value Ref Range   Glucose-Capillary 153 (H) 70 - 99 mg/dL   Comment 1 Notify RN   Glucose, capillary     Status: Abnormal   Collection Time: 12/10/13  7:28 AM  Result Value Ref Range   Glucose-Capillary 151 (H) 70 - 99 mg/dL  Glucose, capillary     Status: Abnormal   Collection Time: 12/10/13  8:29 AM  Result Value Ref Range   Glucose-Capillary 137 (H) 70 - 99 mg/dL  Glucose, capillary     Status: Abnormal   Collection Time: 12/10/13  9:40 AM  Result Value Ref Range   Glucose-Capillary 140 (H) 70 - 99 mg/dL  Glucose, capillary     Status: Abnormal   Collection Time: 12/10/13 10:46 AM  Result Value Ref Range   Glucose-Capillary 148 (H) 70 - 99 mg/dL  Glucose, capillary     Status: Abnormal   Collection Time: 12/10/13 12:02 PM  Result Value Ref Range   Glucose-Capillary 173 (H) 70 - 99 mg/dL  Glucose, capillary     Status: Abnormal   Collection Time: 12/10/13  1:08 PM  Result Value Ref Range   Glucose-Capillary 185 (H) 70 - 99 mg/dL  Glucose, capillary     Status: Abnormal   Collection Time: 12/10/13  5:44 PM  Result Value Ref Range   Glucose-Capillary 257 (H) 70 - 99 mg/dL  Glucose, capillary     Status: Abnormal   Collection Time: 12/10/13 10:01 PM  Result Value Ref Range   Glucose-Capillary 205 (H) 70 - 99 mg/dL  Protime-INR     Status: Abnormal   Collection Time: 12/11/13  2:25 AM  Result Value Ref Range   Prothrombin Time 20.7 (H) 11.6 - 15.2 seconds   INR 1.76  (H) 0.00 - 1.49  CBC     Status: Abnormal   Collection Time: 12/11/13  2:25 AM  Result Value Ref Range   WBC 15.5 (H) 4.0 - 10.5 K/uL   RBC 3.24 (L) 4.22 - 5.81 MIL/uL   Hemoglobin 8.6 (L) 13.0 - 17.0 g/dL   HCT 27.2 (L) 39.0 - 52.0 %   MCV 84.0 78.0 - 100.0 fL   MCH 26.5 26.0 - 34.0 pg   MCHC 31.6 30.0 - 36.0 g/dL   RDW 13.2 11.5 - 15.5 %   Platelets 384 150 - 400 K/uL  Basic metabolic panel     Status: Abnormal   Collection Time: 12/11/13  2:25 AM  Result Value Ref Range   Sodium 132 (L)  137 - 147 mEq/L   Potassium 4.0 3.7 - 5.3 mEq/L   Chloride 95 (L) 96 - 112 mEq/L   CO2 24 19 - 32 mEq/L   Glucose, Bld 144 (H) 70 - 99 mg/dL   BUN 54 (H) 6 - 23 mg/dL   Creatinine, Ser 2.04 (H) 0.50 - 1.35 mg/dL   Calcium 8.6 8.4 - 10.5 mg/dL   GFR calc non Af Amer 40 (L) >90 mL/min   GFR calc Af Amer 46 (L) >90 mL/min    Comment: (NOTE) The eGFR has been calculated using the CKD EPI equation. This calculation has not been validated in all clinical situations. eGFR's persistently <90 mL/min signify possible Chronic Kidney Disease.    Anion gap 13 5 - 15  Glucose, capillary     Status: Abnormal   Collection Time: 12/11/13  4:54 AM  Result Value Ref Range   Glucose-Capillary 139 (H) 70 - 99 mg/dL  Urinalysis, Routine w reflex microscopic     Status: Abnormal   Collection Time: 12/11/13  5:00 AM  Result Value Ref Range   Color, Urine ORANGE (A) YELLOW    Comment: BIOCHEMICALS MAY BE AFFECTED BY COLOR   APPearance CLOUDY (A) CLEAR   Specific Gravity, Urine 1.024 1.005 - 1.030   pH 5.0 5.0 - 8.0   Glucose, UA 500 (A) NEGATIVE mg/dL   Hgb urine dipstick SMALL (A) NEGATIVE   Bilirubin Urine SMALL (A) NEGATIVE   Ketones, ur 15 (A) NEGATIVE mg/dL   Protein, ur >300 (A) NEGATIVE mg/dL   Urobilinogen, UA 2.0 (H) 0.0 - 1.0 mg/dL   Nitrite NEGATIVE NEGATIVE   Leukocytes, UA NEGATIVE NEGATIVE  Urine microscopic-add on     Status: None   Collection Time: 12/11/13  5:00 AM  Result Value Ref  Range   Squamous Epithelial / LPF RARE RARE   WBC, UA 3-6 <3 WBC/hpf   RBC / HPF 3-6 <3 RBC/hpf   Bacteria, UA RARE RARE   Urine-Other AMORPHOUS URATES/PHOSPHATES   Glucose, capillary     Status: Abnormal   Collection Time: 12/11/13  7:44 AM  Result Value Ref Range   Glucose-Capillary 189 (H) 70 - 99 mg/dL  Glucose, capillary     Status: Abnormal   Collection Time: 12/11/13 11:42 AM  Result Value Ref Range   Glucose-Capillary 239 (H) 70 - 99 mg/dL    Mr Foot Right Wo Contrast  12/11/2013   CLINICAL DATA:  Diabetic patient with a draining foot ulceration. Fever and chills.  EXAM: MRI OF THE RIGHT FOREFOOT WITHOUT CONTRAST  TECHNIQUE: Multiplanar, multisequence MR imaging was performed. No intravenous contrast was administered.  COMPARISON:  Plain films right foot 12/09/2013 and MRI right foot 02/10/2012.  FINDINGS: Skin ulceration is identified on the plantar surface of the foot at the level of the fourth MTP joint. Subjacent to the ulceration, there is a fluid collection measuring 1.6 cm long by 1.2 cm craniocaudal by 1.6 cm transverse highly suspicious for abscess. No bone marrow signal abnormality to suggest osteomyelitis is identified. There is only trace amount of fluid in the fourth MTP joint. Similar small joint effusions are seen in the remaining MTP joints. Soft tissues of the foot or diffusely edematous.  IMPRESSION: Skin ulceration on the plantar surface of the foot the level the fourth MTP joint with an underlying fluid collection compatible with abscess. No evidence of osteomyelitis or septic joint is identified.   Electronically Signed   By: Inge Rise M.D.   On:  12/11/2013 08:39   Dg Foot 2 Views Right  12/10/2013   CLINICAL DATA:  Diabetic ulceration on the plantar surface of the right foot for the past year, recently worsening. Initial encounter.  EXAM: RIGHT FOOT - 2 VIEW  COMPARISON:  MRI of the right foot performed 02/10/2012, and right foot radiographs performed  02/09/2012  FINDINGS: There is no evidence of fracture or dislocation. The joint spaces are preserved. There is no evidence of talar subluxation; the subtalar joint is unremarkable in appearance. A posterior calcaneal spur is noted. Mild spurring is noted dorsally at the midfoot.  The known soft tissue ulceration is not well characterized on radiograph. No radiopaque foreign bodies are seen. There is no evidence of osseous erosion.  IMPRESSION: No radiopaque foreign bodies seen. No evidence of osseous erosion to suggest osteomyelitis. No evidence of fracture or dislocation.   Electronically Signed   By: Garald Balding M.D.   On: 12/10/2013 02:05    ROS:  As above PE:  Blood pressure 140/74, pulse 110, temperature 102.1 F (38.9 C), temperature source Oral, resp. rate 17, height 6' 2"  (1.88 m), weight 151.501 kg (334 lb), SpO2 97 %. Obese male in nad.  A and O x 4. Mood normal.  Affect flat.  EOMI.  resp unlabored.  Gait is NWB on the rgith.  R foot with 1 cm plantar ulcer that is about a cm deep.  No purulence.  Skin o/w intact but dry.  5/5 strength in PF and DF of the foot.  Sens to LT diminished at the forefoot.  Swelling but no gross deformity.  No fluctuance.  Probing with CTA reveals no purulence or abscess.  Assessment/Plan: R foot diabetic foot ulcer and possible abscess.  The MRI shows an area of concern for abscess, but it is very superficial.  At this point I'll plan to explore the wound and I and D the abscess.  Since the wound is so superficial, this can be done safely at the bedside.  [name] 12/11/2013, 1:53 PM    Procedure:  After informed consent and sterile prep I anesthetized the plantar skin around the area of the ulcer and abscess on the right foot.  I think incised the skin increasing the size of the ulcer.  I bluntly dissected the subcutaneous tissue circumferentially.  Distal and lateral to the ulcer an abscess opened and drained sanguinous purulent material.  I cultured  this tissue with aerobic and anaerobic swabs.  I then irrigated the abscess with a liter of NS and packed the wound with iodoform gauze.  The wound was dressed with sterile dressings.  The pt tolerated this procedure well without evident complication.

## 2013-12-11 NOTE — Progress Notes (Signed)
Internal Medicine Clinic Attending  Date of Visit: 12/09/2013  I saw and evaluated the patient at the time of the visit.  I personally confirmed the key portions of the history and exam documented by Dr. Marvel Plan and I reviewed pertinent patient test results.  The assessment, diagnosis, and plan were formulated together and I agree with the documentation in the resident's note.

## 2013-12-11 NOTE — Progress Notes (Signed)
Subjective: Doing well this morning. Pain controlled. No new complaints this morning.   Objective: Vital signs in last 24 hours: Filed Vitals:   12/11/13 0500 12/11/13 0700 12/11/13 1348 12/11/13 1517  BP:  121/60 140/74   Pulse:  104 110   Temp:  99.1 F (37.3 C) 102.1 F (38.9 C) 101.6 F (38.7 C)  TempSrc:  Oral Oral Oral  Resp:  18 17   Height:      Weight: 334 lb (151.501 kg)     SpO2:  94% 97%    Weight change: 10 lb 11.2 oz (4.853 kg)  Intake/Output Summary (Last 24 hours) at 12/11/13 1743 Last data filed at 12/11/13 1104  Gross per 24 hour  Intake 2451.75 ml  Output   2250 ml  Net 201.75 ml   General: NAD HEENT: PERRL, EOMI, no scleral icterus Cardiac: RRR, no rubs, murmurs or gallops Pulm: clear to auscultation bilaterally, moving normal volumes of air Abd: soft, nontender, nondistended Ext: 3 cm diameter area of ulceration in the right foot at with no ability to probe to the bone, no significant drainage, no significant surrounding erythema or tenderness, no crepitus, left foot normal. Palpable DP pulses bilaterally. Neuro: alert and oriented X3, cranial nerves II-XII grossly intact, strength 5 out of 5 in all 4 extremities, sensation intact to light touch throughout all extremities  Lab Results: Basic Metabolic Panel:  Recent Labs Lab 12/10/13 0258 12/11/13 0225  NA 136* 132*  K 4.3 4.0  CL 99 95*  CO2 25 24  GLUCOSE 162* 144*  BUN 55* 54*  CREATININE 2.41* 2.04*  CALCIUM 9.0 8.6   Liver Function Tests:  Recent Labs Lab 12/09/13 1559 12/10/13 0258  AST 20 13  ALT 21 17  ALKPHOS 188* 140*  BILITOT 1.0 0.8  PROT 8.4* 7.2  ALBUMIN 2.7* 2.4*   CBC:  Recent Labs Lab 12/09/13 1559 12/10/13 0258 12/11/13 0225  WBC 15.3* 17.9* 15.5*  NEUTROABS 11.9*  --   --   HGB 11.0* 8.9* 8.6*  HCT 32.5* 27.1* 27.2*  MCV 83.8 81.6 84.0  PLT 378 368 384   CBG:  Recent Labs Lab 12/10/13 1744 12/10/13 2201 12/11/13 0454 12/11/13 0744  12/11/13 1142 12/11/13 1646  GLUCAP 257* 205* 139* 189* 239* 215*   Coagulation:  Recent Labs Lab 12/09/13 1948 12/10/13 0252 12/11/13 0225  LABPROT 16.1* 17.1* 20.7*  INR 1.28 1.38 1.76*   Urine Drug Screen: Drugs of Abuse     Component Value Date/Time   LABOPIA POSITIVE* 07/12/2011 0038   COCAINSCRNUR NONE DETECTED 07/12/2011 0038   LABBENZ NONE DETECTED 07/12/2011 0038   AMPHETMU NONE DETECTED 07/12/2011 0038   THCU NONE DETECTED 07/12/2011 0038   LABBARB NONE DETECTED 07/12/2011 0038    Micro Results: Recent Results (from the past 240 hour(s))  Culture, Blood     Status: None (Preliminary result)   Collection Time: 12/09/13  4:28 PM  Result Value Ref Range Status   Preliminary Report Blood Culture received; No Growth to date;  Preliminary   Preliminary Report Culture will be held for 5 days before issuing  Preliminary   Preliminary Report a Final Negative report.  Preliminary  Culture, Blood     Status: None (Preliminary result)   Collection Time: 12/09/13  4:44 PM  Result Value Ref Range Status   Preliminary Report Blood Culture received; No Growth to date;  Preliminary   Preliminary Report Culture will be held for 5 days before issuing  Preliminary  Preliminary Report a Final Negative report.  Preliminary   Studies/Results: Mr Foot Right Wo Contrast  12/11/2013   CLINICAL DATA:  Diabetic patient with a draining foot ulceration. Fever and chills.  EXAM: MRI OF THE RIGHT FOREFOOT WITHOUT CONTRAST  TECHNIQUE: Multiplanar, multisequence MR imaging was performed. No intravenous contrast was administered.  COMPARISON:  Plain films right foot 12/09/2013 and MRI right foot 02/10/2012.  FINDINGS: Skin ulceration is identified on the plantar surface of the foot at the level of the fourth MTP joint. Subjacent to the ulceration, there is a fluid collection measuring 1.6 cm long by 1.2 cm craniocaudal by 1.6 cm transverse highly suspicious for abscess. No bone marrow signal  abnormality to suggest osteomyelitis is identified. There is only trace amount of fluid in the fourth MTP joint. Similar small joint effusions are seen in the remaining MTP joints. Soft tissues of the foot or diffusely edematous.  IMPRESSION: Skin ulceration on the plantar surface of the foot the level the fourth MTP joint with an underlying fluid collection compatible with abscess. No evidence of osteomyelitis or septic joint is identified.   Electronically Signed   By: Inge Rise M.D.   On: 12/11/2013 08:39   Dg Foot 2 Views Right  12/10/2013   CLINICAL DATA:  Diabetic ulceration on the plantar surface of the right foot for the past year, recently worsening. Initial encounter.  EXAM: RIGHT FOOT - 2 VIEW  COMPARISON:  MRI of the right foot performed 02/10/2012, and right foot radiographs performed 02/09/2012  FINDINGS: There is no evidence of fracture or dislocation. The joint spaces are preserved. There is no evidence of talar subluxation; the subtalar joint is unremarkable in appearance. A posterior calcaneal spur is noted. Mild spurring is noted dorsally at the midfoot.  The known soft tissue ulceration is not well characterized on radiograph. No radiopaque foreign bodies are seen. There is no evidence of osseous erosion.  IMPRESSION: No radiopaque foreign bodies seen. No evidence of osseous erosion to suggest osteomyelitis. No evidence of fracture or dislocation.   Electronically Signed   By: Garald Balding M.D.   On: 12/10/2013 02:05   Medications: I have reviewed the patient's current medications. Scheduled Meds: . antiseptic oral rinse  7 mL Mouth Rinse q12n4p  . chlorhexidine  15 mL Mouth Rinse BID  . feeding supplement (PRO-STAT SUGAR FREE 64)  30 mL Oral TID WC  . insulin aspart  0-15 Units Subcutaneous TID WC  . insulin aspart protamine- aspart  35 Units Subcutaneous BID WC  . piperacillin-tazobactam (ZOSYN)  IV  3.375 g Intravenous 3 times per day  . pravastatin  20 mg Oral q1800  .  vancomycin  1,500 mg Intravenous Q12H   Continuous Infusions: . sodium chloride 125 mL/hr at 12/11/13 1402   PRN Meds:.acetaminophen **OR** acetaminophen, dextrose, oxyCODONE-acetaminophen Assessment/Plan: Active Problems:   DM (diabetes mellitus) type I uncontrolled, periph vascular disorder   DEEP VENOUS THROMBOPHLEBITIS, HX OF   Foot ulcer, right   Essential hypertension   Cellulitis  Diabetic foot ulcer: MR R foot w/o contrast with skin ulceration on the plantar surface of the foot the level the fourth MTP joint with an underlying fluid collection compatible with abscess - Continue 1500mg  IV Vancomycin Q12hr and 3.375g IV Zosyn Q8hr - IV fluids 125 mL per hour - Ortho consulted, plan for bedside I&D  - Percocet 5/325mg  1-2 tab Q6hr prn pain  Suspected Diabetic ketoacidosis secondary to type 2 diabetes: Resolved  - SSI mod, CBG q  AC/HS  - Novolog 70/30 35u BID  AKI on CKD stage 2: Cr downtrending to 2.04 from 2.41 (baseline around 1.6)  - Continue IV fluids  - Avoid renal toxins  Hypertension:   - Holding home lisinopril-hydrochlorothiazide and Lasix 40 mg  Recurrent DVTs on chronic Coumadin therapy: - holding warfarin  FEN:  - carb modified diet  DVT ppx: SCDs  Dispo: Disposition is deferred at this time, awaiting improvement of current medical problems.  Anticipated discharge in approximately 1-2 day(s).   The patient does have a current PCP Lucious Groves, DO) and does need an [facility] hospital follow-up appointment after discharge.  The patient does not have transportation limitations that hinder transportation to clinic appointments.  .Services Needed at time of discharge: Y = Yes, Blank = No PT:   OT:   RN:   Equipment:   Other:     LOS: 2 days   Jacques Earthly, MD 12/11/2013, 5:43 PM

## 2013-12-12 ENCOUNTER — Inpatient Hospital Stay (HOSPITAL_COMMUNITY): Payer: Medicaid Other

## 2013-12-12 DIAGNOSIS — I82409 Acute embolism and thrombosis of unspecified deep veins of unspecified lower extremity: Secondary | ICD-10-CM

## 2013-12-12 DIAGNOSIS — N182 Chronic kidney disease, stage 2 (mild): Secondary | ICD-10-CM

## 2013-12-12 DIAGNOSIS — A419 Sepsis, unspecified organism: Secondary | ICD-10-CM | POA: Insufficient documentation

## 2013-12-12 DIAGNOSIS — L02419 Cutaneous abscess of limb, unspecified: Secondary | ICD-10-CM | POA: Diagnosis present

## 2013-12-12 DIAGNOSIS — E11621 Type 2 diabetes mellitus with foot ulcer: Secondary | ICD-10-CM

## 2013-12-12 DIAGNOSIS — L03119 Cellulitis of unspecified part of limb: Secondary | ICD-10-CM

## 2013-12-12 LAB — BASIC METABOLIC PANEL
Anion gap: 15 (ref 5–15)
BUN: 49 mg/dL — ABNORMAL HIGH (ref 6–23)
CO2: 20 meq/L (ref 19–32)
CREATININE: 1.69 mg/dL — AB (ref 0.50–1.35)
Calcium: 8.5 mg/dL (ref 8.4–10.5)
Chloride: 100 mEq/L (ref 96–112)
GFR calc Af Amer: 58 mL/min — ABNORMAL LOW (ref >90)
GFR calc non Af Amer: 50 mL/min — ABNORMAL LOW (ref >90)
GLUCOSE: 214 mg/dL — AB (ref 70–99)
Potassium: 4.4 mEq/L (ref 3.7–5.3)
Sodium: 135 mEq/L — ABNORMAL LOW (ref 137–147)

## 2013-12-12 LAB — GLUCOSE, CAPILLARY
GLUCOSE-CAPILLARY: 216 mg/dL — AB (ref 70–99)
Glucose-Capillary: 191 mg/dL — ABNORMAL HIGH (ref 70–99)
Glucose-Capillary: 215 mg/dL — ABNORMAL HIGH (ref 70–99)
Glucose-Capillary: 202 mg/dL — ABNORMAL HIGH (ref 70–99)
Glucose-Capillary: 206 mg/dL — ABNORMAL HIGH (ref 70–99)

## 2013-12-12 LAB — CBC
HEMATOCRIT: 25.9 % — AB (ref 39.0–52.0)
Hemoglobin: 8.4 g/dL — ABNORMAL LOW (ref 13.0–17.0)
MCH: 27.3 pg (ref 26.0–34.0)
MCHC: 32.4 g/dL (ref 30.0–36.0)
MCV: 84.1 fL (ref 78.0–100.0)
PLATELETS: 439 10*3/uL — AB (ref 150–400)
RBC: 3.08 MIL/uL — ABNORMAL LOW (ref 4.22–5.81)
RDW: 13.5 % (ref 11.5–15.5)
WBC: 16.5 10*3/uL — ABNORMAL HIGH (ref 4.0–10.5)

## 2013-12-12 LAB — VANCOMYCIN, TROUGH: Vancomycin Tr: 24.3 ug/mL — ABNORMAL HIGH (ref 10.0–20.0)

## 2013-12-12 LAB — PROTIME-INR
INR: 2.09 — AB (ref 0.00–1.49)
PROTHROMBIN TIME: 23.6 s — AB (ref 11.6–15.2)

## 2013-12-12 MED ORDER — POLYETHYLENE GLYCOL 3350 17 G PO PACK
17.0000 g | PACK | Freq: Every day | ORAL | Status: DC
  Administered 2013-12-12: 17 g via ORAL
  Filled 2013-12-12 (×4): qty 1

## 2013-12-12 MED ORDER — VANCOMYCIN HCL 10 G IV SOLR
2000.0000 mg | INTRAVENOUS | Status: DC
  Administered 2013-12-13 – 2013-12-22 (×10): 2000 mg via INTRAVENOUS
  Filled 2013-12-12 (×10): qty 2000

## 2013-12-12 MED ORDER — SENNOSIDES-DOCUSATE SODIUM 8.6-50 MG PO TABS
ORAL_TABLET | ORAL | Status: AC
  Filled 2013-12-12: qty 2

## 2013-12-12 MED ORDER — OXYCODONE-ACETAMINOPHEN 5-325 MG PO TABS
1.0000 | ORAL_TABLET | ORAL | Status: DC | PRN
  Administered 2013-12-12 – 2013-12-23 (×17): 2 via ORAL
  Filled 2013-12-12 (×17): qty 2

## 2013-12-12 MED ORDER — ONDANSETRON HCL 4 MG PO TABS
4.0000 mg | ORAL_TABLET | Freq: Once | ORAL | Status: AC
  Administered 2013-12-12: 4 mg via ORAL

## 2013-12-12 MED ORDER — SENNOSIDES-DOCUSATE SODIUM 8.6-50 MG PO TABS
ORAL_TABLET | ORAL | Status: AC
  Filled 2013-12-12: qty 1

## 2013-12-12 MED ORDER — ONDANSETRON HCL 4 MG PO TABS
4.0000 mg | ORAL_TABLET | Freq: Three times a day (TID) | ORAL | Status: DC | PRN
  Administered 2013-12-13 – 2013-12-18 (×3): 4 mg via ORAL
  Filled 2013-12-12 (×3): qty 1

## 2013-12-12 MED ORDER — WARFARIN - PHARMACIST DOSING INPATIENT: Freq: Every day | Status: DC

## 2013-12-12 MED ORDER — SENNOSIDES-DOCUSATE SODIUM 8.6-50 MG PO TABS
1.0000 | ORAL_TABLET | Freq: Two times a day (BID) | ORAL | Status: DC
  Administered 2013-12-12 (×2): 1 via ORAL
  Filled 2013-12-12: qty 2
  Filled 2013-12-12: qty 1

## 2013-12-12 MED ORDER — WARFARIN SODIUM 7.5 MG PO TABS
7.5000 mg | ORAL_TABLET | Freq: Once | ORAL | Status: AC
  Administered 2013-12-12: 7.5 mg via ORAL
  Filled 2013-12-12: qty 1

## 2013-12-12 MED ORDER — ONDANSETRON HCL 4 MG PO TABS
ORAL_TABLET | ORAL | Status: AC
  Filled 2013-12-12: qty 1

## 2013-12-12 MED ORDER — INSULIN ASPART PROT & ASPART (70-30 MIX) 100 UNIT/ML ~~LOC~~ SUSP
45.0000 [IU] | Freq: Two times a day (BID) | SUBCUTANEOUS | Status: DC
  Administered 2013-12-13 – 2013-12-14 (×2): 45 [IU] via SUBCUTANEOUS
  Administered 2013-12-14: 20 [IU] via SUBCUTANEOUS
  Administered 2013-12-15 – 2013-12-18 (×5): 45 [IU] via SUBCUTANEOUS
  Filled 2013-12-12: qty 10

## 2013-12-12 NOTE — Progress Notes (Signed)
Subjective: Pt reports that his foot feels better this morning.  No n/v/f/c.   Objective: Vital signs in last 24 hours: Temp:  [98.7 F (37.1 C)-101.2 F (38.4 C)] 101.2 F (38.4 C) (12/04 1430) Pulse Rate:  [98-108] 108 (12/04 1430) Resp:  [18-20] 18 (12/04 1430) BP: (131-136)/(54-63) 136/54 mmHg (12/04 1430) SpO2:  [93 %-98 %] 98 % (12/04 1430) Weight:  [155.13 kg (342 lb)] 155.13 kg (342 lb) (12/04 0500)  Intake/Output from previous day: 12/03 0701 - 12/04 0700 In: 2845.8 [P.O.:850; I.V.:1995.8] Out: 2500 [Urine:2500] Intake/Output this shift:     Recent Labs  12/10/13 0258 12/11/13 0225 12/12/13 0418  HGB 8.9* 8.6* 8.4*    Recent Labs  12/11/13 0225 12/12/13 0418  WBC 15.5* 16.5*  RBC 3.24* 3.08*  HCT 27.2* 25.9*  PLT 384 439*    Recent Labs  12/11/13 0225 12/12/13 0418  NA 132* 135*  K 4.0 4.4  CL 95* 100  CO2 24 20  BUN 54* 49*  CREATININE 2.04* 1.69*  GLUCOSE 144* 214*  CALCIUM 8.6 8.5    Recent Labs  12/11/13 0225 12/12/13 0418  INR 1.76* 2.09*    PE:  r foot wound dressed and dry with packing in place.  Assessment/Plan: R foot ulcer and abscess s/p irrigation, debridement and packing.  Cultures pending.  Begin dressing changes.  IV abx per teaching service.  I'll sign off for th weekend and see the patient MOnday if still in house.  O/w he should f/u with the wound center for continued outpatient treatment of his foot wound.    [name] 12/12/2013, 9:31 PM

## 2013-12-12 NOTE — Progress Notes (Signed)
Subjective: Doing well this morning. S/p bedside I&D. Pain controlled. No new complaints this morning. Denies SOB or CP. Some nausea previously but improved now.   Objective: Vital signs in last 24 hours: Filed Vitals:   12/11/13 1517 12/11/13 2100 12/12/13 0500 12/12/13 1430  BP:  141/74 131/63 136/54  Pulse:  108 98 108  Temp: 101.6 F (38.7 C) 99.1 F (37.3 C) 98.7 F (37.1 C) 101.2 F (38.4 C)  TempSrc: Oral Oral Oral Oral  Resp:  18 20 18   Height:      Weight:   342 lb (155.13 kg)   SpO2:  98% 93% 98%   Weight change: 8 lb (3.629 kg)  Intake/Output Summary (Last 24 hours) at 12/12/13 1745 Last data filed at 12/12/13 1604  Gross per 24 hour  Intake 3445.83 ml  Output   2100 ml  Net 1345.83 ml   General: NAD HEENT: PERRL, EOMI, no scleral icterus Cardiac: RRR, no rubs, murmurs or gallops Pulm: clear to auscultation bilaterally, moving normal volumes of air Abd: soft, nontender, nondistended Ext: Dressing in place on R foot with minimal strikethrough. Palpable DP pulses bilaterally. Neuro: alert and oriented X3, cranial nerves II-XII grossly intact, strength 5 out of 5 in all 4 extremities, sensation intact to light touch throughout all extremities  Lab Results: Basic Metabolic Panel:  Recent Labs Lab 12/11/13 0225 12/12/13 0418  NA 132* 135*  K 4.0 4.4  CL 95* 100  CO2 24 20  GLUCOSE 144* 214*  BUN 54* 49*  CREATININE 2.04* 1.69*  CALCIUM 8.6 8.5   Liver Function Tests:  Recent Labs Lab 12/09/13 1559 12/10/13 0258  AST 20 13  ALT 21 17  ALKPHOS 188* 140*  BILITOT 1.0 0.8  PROT 8.4* 7.2  ALBUMIN 2.7* 2.4*   CBC:  Recent Labs Lab 12/09/13 1559  12/11/13 0225 12/12/13 0418  WBC 15.3*  < > 15.5* 16.5*  NEUTROABS 11.9*  --   --   --   HGB 11.0*  < > 8.6* 8.4*  HCT 32.5*  < > 27.2* 25.9*  MCV 83.8  < > 84.0 84.1  PLT 378  < > 384 439*  < > = values in this interval not displayed. CBG:  Recent Labs Lab 12/11/13 1142 12/11/13 1646  12/11/13 2239 12/12/13 0800 12/12/13 1143 12/12/13 1703  GLUCAP 239* 215* 191* 215* 202* 216*   Coagulation:  Recent Labs Lab 12/09/13 1948 12/10/13 0252 12/11/13 0225 12/12/13 0418  LABPROT 16.1* 17.1* 20.7* 23.6*  INR 1.28 1.38 1.76* 2.09*   Urine Drug Screen: Drugs of Abuse     Component Value Date/Time   LABOPIA POSITIVE* 07/12/2011 0038   COCAINSCRNUR NONE DETECTED 07/12/2011 0038   LABBENZ NONE DETECTED 07/12/2011 0038   AMPHETMU NONE DETECTED 07/12/2011 0038   THCU NONE DETECTED 07/12/2011 0038   LABBARB NONE DETECTED 07/12/2011 0038    Micro Results: Recent Results (from the past 240 hour(s))  Culture, Blood     Status: None (Preliminary result)   Collection Time: 12/09/13  4:28 PM  Result Value Ref Range Status   Preliminary Report Blood Culture received; No Growth to date;  Preliminary   Preliminary Report Culture will be held for 5 days before issuing  Preliminary   Preliminary Report a Final Negative report.  Preliminary  Culture, Blood     Status: None (Preliminary result)   Collection Time: 12/09/13  4:44 PM  Result Value Ref Range Status   Preliminary Report Blood Culture received; No  Growth to date;  Preliminary   Preliminary Report Culture will be held for 5 days before issuing  Preliminary   Preliminary Report a Final Negative report.  Preliminary   Studies/Results: No results found. Medications: I have reviewed the patient's current medications. Scheduled Meds: . antiseptic oral rinse  7 mL Mouth Rinse q12n4p  . chlorhexidine  15 mL Mouth Rinse BID  . feeding supplement (PRO-STAT SUGAR FREE 64)  30 mL Oral TID WC  . insulin aspart  0-15 Units Subcutaneous TID WC  . insulin aspart protamine- aspart  45 Units Subcutaneous BID WC  . piperacillin-tazobactam (ZOSYN)  IV  3.375 g Intravenous 3 times per day  . polyethylene glycol  17 g Oral Daily  . pravastatin  20 mg Oral q1800  . senna-docusate  1 tablet Oral BID  . [START ON 12/13/2013]  vancomycin  2,000 mg Intravenous Q24H  . warfarin  7.5 mg Oral ONCE-1800  . Warfarin - Pharmacist Dosing Inpatient   Does not apply q1800   Continuous Infusions: . sodium chloride 125 mL/hr at 12/11/13 1402   PRN Meds:.acetaminophen **OR** acetaminophen, dextrose, oxyCODONE-acetaminophen Assessment/Plan: Active Problems:   DM (diabetes mellitus) type I uncontrolled, periph vascular disorder   DEEP VENOUS THROMBOPHLEBITIS, HX OF   Foot ulcer, right   Essential hypertension   Cellulitis  Diabetic foot ulcer: MR R foot w/o contrast with skin ulceration on the plantar surface of the foot the level the fourth MTP joint with an underlying fluid collection compatible with abscess. S/p Bedside I&D 12/12/2013 - Continue IV Vancomycin and 3.375g IV Zosyn Q8hr. Consider transition to PO abx tomorrow if continues to improve. - IV fluids 125 mL per hour, will consider d/c IVF tomorrow  - Percocet 5/325mg  1-2 tab Q4hr prn pain  - Wound culture pending  Suspected Diabetic ketoacidosis secondary to type 2 diabetes: Resolved  - SSI mod, CBG q AC/HS  - Novolog 70/30 45u BID  AKI on CKD stage 2: Cr downtrending to 1.69 from 2.04 (baseline around 1.6)  - Continue IV fluids. Likely d/c IVF tomorrow  - Avoid renal toxins  Hypertension:   - Holding home lisinopril-hydrochlorothiazide and Lasix 40 mg  Recurrent DVTs on chronic Coumadin therapy: - restart warfarin  FEN:  - carb modified diet  DVT ppx: SCDs, warfarin  Dispo: Disposition is deferred at this time, awaiting improvement of current medical problems.  Anticipated discharge in approximately 1-2 day(s).   The patient does have a current PCP Lucious Groves, DO) and does need an [facility] hospital follow-up appointment after discharge.  The patient does not have transportation limitations that hinder transportation to clinic appointments.  .Services Needed at time of discharge: Y = Yes, Blank = No PT:   OT:   RN:   Equipment:   Other:      LOS: 3 days   Jacques Earthly, MD 12/12/2013, 5:45 PM

## 2013-12-12 NOTE — Progress Notes (Signed)
ANTIBIOTIC and ANTICOAGULATION CONSULT NOTE - FOLLOW UP  Pharmacy Consult for vanc/zosyn and coumadin Indication: R heel cellulitis; recurrent DVTs  No Known Allergies  Patient Measurements: Height: 6\' 2"  (188 cm) Weight: (!) 342 lb (155.13 kg) IBW/kg (Calculated) : 82.2   Vital Signs: Temp: 98.7 F (37.1 C) (12/04 0500) Temp Source: Oral (12/04 0500) BP: 131/63 mmHg (12/04 0500) Pulse Rate: 98 (12/04 0500) Intake/Output from previous day: 12/03 0701 - 12/04 0700 In: 2845.8 [P.O.:850; I.V.:1995.8] Out: 2500 [Urine:2500] Intake/Output from this shift:    Labs:  Recent Labs  12/10/13 0258 12/11/13 0225 12/12/13 0418  WBC 17.9* 15.5* 16.5*  HGB 8.9* 8.6* 8.4*  PLT 368 384 439*  CREATININE 2.41* 2.04* 1.69*   Estimated Creatinine Clearance: 94.3 mL/min (by C-G formula based on Cr of 1.69).  Recent Labs  12/12/13 0715  [place] 24.3*     Microbiology: Recent Results (from the past 720 hour(s))  Culture, Blood     Status: None (Preliminary result)   Collection Time: 12/09/13  4:28 PM  Result Value Ref Range Status   Preliminary Report Blood Culture received; No Growth to date;  Preliminary   Preliminary Report Culture will be held for 5 days before issuing  Preliminary   Preliminary Report a Final Negative report.  Preliminary  Culture, Blood     Status: None (Preliminary result)   Collection Time: 12/09/13  4:44 PM  Result Value Ref Range Status   Preliminary Report Blood Culture received; No Growth to date;  Preliminary   Preliminary Report Culture will be held for 5 days before issuing  Preliminary   Preliminary Report a Final Negative report.  Preliminary    Anti-infectives    Start     Dose/Rate Route Frequency Ordered Stop   12/10/13 0800  vancomycin (VANCOCIN) 1,500 mg in sodium chloride 0.9 % 500 mL IVPB     1,500 mg250 mL/hr over 120 Minutes Intravenous Every 12 hours 12/09/13 1905     12/09/13 2200  piperacillin-tazobactam (ZOSYN) IVPB 3.375 g      3.375 g12.5 mL/hr over 240 Minutes Intravenous 3 times per day 12/09/13 1905     12/09/13 2000  vancomycin (VANCOCIN) 2,500 mg in sodium chloride 0.9 % 500 mL IVPB     2,500 mg250 mL/hr over 120 Minutes Intravenous  Once 12/09/13 1905 12/09/13 2353      Assessment: 38 yo M on vanc/zosyn for R heel celluilitis.  WBC 16.5, creat 1.69.  T max 101.6. No osteo on foot xray. MR R foot compatible with abscess- s/p I&D by ortho 12/3 at the bedside.  Vanc trough this morning of 24.3 mcg/ml is higher than goal.    vanc 12/1>> Zosyn 12/1>>  12/4 VT 24.3 on vanc 1.5 gm q12h  12/4 abscess cx>> 12/4 fluid cx>>  To resume coumadin per pharmacy for hx of recurrent DVTs now that pt is s/p I&D.  INR is therapeutic today at 2.09 which is up from 1.76 after dose being held 12/3.  Dose prior to admission was 7.5 qd, 10mg  M/Thur. Hg low at 8.4, pltc high at 439.  No bleeding reported.   Goal of Therapy:  Vancomycin trough level 10-15 mcg/ml  INR 2-3  Plan:  -change vancomycin dose to 2 gm IV q24 -continue zosyn 3.375 gm IV q8h, infuse each dose over 4 hours -f/u cultures from I&D but taken while pt on abx -f/u renal fxn, wbc, temp -repeat vanc trough as needed -coumadin 7.5 mg po x 1 dose -daily  INR  Eudelia Bunch, Pharm.D. BP:[phone] 12/12/2013 10:34 AM coumadin

## 2013-12-12 NOTE — Progress Notes (Signed)
Inpatient Diabetes Program Recommendations  AACE/ADA: New Consensus Statement on Inpatient Glycemic Control (2013)  Target Ranges:  Prepandial:   less than 140 mg/dL      Peak postprandial:   less than 180 mg/dL (1-2 hours)      Critically ill patients:  140 - 180 mg/dL   This coordinator visited pt to follow up for questions/concerns about diabetes management at home.  Pt states he does not have any questions/concerns other than needing help with getting disability.  Explained to patient that I am not involved in that process and he will need to discuss with his PCP.  Pt given a diabetes meal planning guide.  Pt was appreciative of visit.  No further questions/concerns at the end of our visit. Thank you  Raoul Pitch BSN, RN,CDE Inpatient Diabetes Coordinator [phone] (team pager)

## 2013-12-12 NOTE — Discharge Instructions (Addendum)
Change your dressing daily as follows:  Remove the dressing and shower.  After showering and washing the wound gently with soap and water, pack the wound with iodoform gauze.  Dress the wound with 4x4 gauze and ABD and kerlix.  You may get the stump incision wet in the shower as well.  Just dress with gauze and the stump shrinker sock daily and as needed.  Information on my medicine - Coumadin   (Warfarin)  This medication education was reviewed with me or my healthcare representative as part of my discharge preparation.   Why was Coumadin prescribed for you? Coumadin was prescribed for you because you have a blood clot or a medical condition that can cause an increased risk of forming blood clots. Blood clots can cause serious health problems by blocking the flow of blood to the heart, lung, or brain. Coumadin can prevent harmful blood clots from forming. As a reminder your indication for Coumadin is:   Pulmonary Embolism Treatment  What test will check on my response to Coumadin? While on Coumadin (warfarin) you will need to have an INR test regularly to ensure that your dose is keeping you in the desired range. The INR (international normalized ratio) number is calculated from the result of the laboratory test called prothrombin time (PT).  If an INR APPOINTMENT HAS NOT ALREADY BEEN MADE FOR YOU please schedule an appointment to have this lab work done by your health care provider within 7 days. Your INR goal is usually a number between:  2 to 3 or your provider may give you a more narrow range like 2-2.5.  Ask your health care provider during an office visit what your goal INR is.  What  do you need to  know  About  COUMADIN? Take Coumadin (warfarin) exactly as prescribed by your healthcare provider about the same time each day.  DO NOT stop taking without talking to the doctor who prescribed the medication.  Stopping without other blood clot prevention medication to take the place of  Coumadin may increase your risk of developing a new clot or stroke.  Get refills before you run out.  What do you do if you miss a dose? If you miss a dose, take it as soon as you remember on the same day then continue your regularly scheduled regimen the next day.  Do not take two doses of Coumadin at the same time.  Important Safety Information A possible side effect of Coumadin (Warfarin) is an increased risk of bleeding. You should call your healthcare provider right away if you experience any of the following: ? Bleeding from an injury or your nose that does not stop. ? Unusual colored urine (red or dark brown) or unusual colored stools (red or black). ? Unusual bruising for unknown reasons. ? A serious fall or if you hit your head (even if there is no bleeding).  Some foods or medicines interact with Coumadin (warfarin) and might alter your response to warfarin. To help avoid this: ? Eat a balanced diet, maintaining a consistent amount of Vitamin K. ? Notify your provider about major diet changes you plan to make. ? Avoid alcohol or limit your intake to 1 drink for women and 2 drinks for men per day. (1 drink is 5 oz. wine, 12 oz. beer, or 1.5 oz. liquor.)  Make sure that ANY health care provider who prescribes medication for you knows that you are taking Coumadin (warfarin).  Also make sure the healthcare provider who  is monitoring your Coumadin knows when you have started a new medication including herbals and non-prescription products.  Coumadin (Warfarin)  Major Drug Interactions  Increased Warfarin Effect Decreased Warfarin Effect  Alcohol (large quantities) Antibiotics (esp. Septra/Bactrim, Flagyl, Cipro) Amiodarone (Cordarone) Aspirin (ASA) Cimetidine (Tagamet) Megestrol (Megace) NSAIDs (ibuprofen, naproxen, etc.) Piroxicam (Feldene) Propafenone (Rythmol SR) Propranolol (Inderal) Isoniazid (INH) Posaconazole (Noxafil) Barbiturates (Phenobarbital) Carbamazepine  (Tegretol) Chlordiazepoxide (Librium) Cholestyramine (Questran) Griseofulvin Oral Contraceptives Rifampin Sucralfate (Carafate) Vitamin K   Coumadin (Warfarin) Major Herbal Interactions  Increased Warfarin Effect Decreased Warfarin Effect  Garlic Ginseng Ginkgo biloba Coenzyme Q10 Green tea St. Johns wort    Coumadin (Warfarin) FOOD Interactions  Eat a consistent number of servings per week of foods HIGH in Vitamin K (1 serving =  cup)  Collards (cooked, or boiled & drained) Kale (cooked, or boiled & drained) Mustard greens (cooked, or boiled & drained) Parsley *serving size only =  cup Spinach (cooked, or boiled & drained) Swiss chard (cooked, or boiled & drained) Turnip greens (cooked, or boiled & drained)  Eat a consistent number of servings per week of foods MEDIUM-HIGH in Vitamin K (1 serving = 1 cup)  Asparagus (cooked, or boiled & drained) Broccoli (cooked, boiled & drained, or raw & chopped) Brussel sprouts (cooked, or boiled & drained) *serving size only =  cup Lettuce, raw (green leaf, endive, romaine) Spinach, raw Turnip greens, raw & chopped   These websites have more information on Coumadin (warfarin):  FailFactory.se; VeganReport.com.au;

## 2013-12-13 ENCOUNTER — Inpatient Hospital Stay (HOSPITAL_COMMUNITY): Payer: Medicaid Other

## 2013-12-13 DIAGNOSIS — L02611 Cutaneous abscess of right foot: Secondary | ICD-10-CM

## 2013-12-13 LAB — BASIC METABOLIC PANEL
ANION GAP: 19 — AB (ref 5–15)
ANION GAP: 16 — AB (ref 5–15)
BUN: 48 mg/dL — AB (ref 6–23)
BUN: 53 mg/dL — ABNORMAL HIGH (ref 6–23)
CHLORIDE: 98 meq/L (ref 96–112)
CO2: 15 mEq/L — ABNORMAL LOW (ref 19–32)
CO2: 19 meq/L (ref 19–32)
Calcium: 9.2 mg/dL (ref 8.4–10.5)
Calcium: 9 mg/dL (ref 8.4–10.5)
Chloride: 100 mEq/L (ref 96–112)
Creatinine, Ser: 1.82 mg/dL — ABNORMAL HIGH (ref 0.50–1.35)
Creatinine, Ser: 2.06 mg/dL — ABNORMAL HIGH (ref 0.50–1.35)
GFR calc non Af Amer: 39 mL/min — ABNORMAL LOW (ref >90)
GFR, EST AFRICAN AMERICAN: 53 mL/min — AB (ref >90)
GFR, EST AFRICAN AMERICAN: 46 mL/min — AB (ref >90)
GFR, EST NON AFRICAN AMERICAN: 46 mL/min — AB (ref >90)
Glucose, Bld: 308 mg/dL — ABNORMAL HIGH (ref 70–99)
Glucose, Bld: 309 mg/dL — ABNORMAL HIGH (ref 70–99)
POTASSIUM: 4.9 meq/L (ref 3.7–5.3)
POTASSIUM: 4.8 meq/L (ref 3.7–5.3)
SODIUM: 133 meq/L — AB (ref 137–147)
Sodium: 134 mEq/L — ABNORMAL LOW (ref 137–147)

## 2013-12-13 LAB — PROTIME-INR
INR: 2.84 — AB (ref 0.00–1.49)
PROTHROMBIN TIME: 30 s — AB (ref 11.6–15.2)

## 2013-12-13 LAB — GLUCOSE, CAPILLARY
GLUCOSE-CAPILLARY: 299 mg/dL — AB (ref 70–99)
Glucose-Capillary: 359 mg/dL — ABNORMAL HIGH (ref 70–99)
Glucose-Capillary: 364 mg/dL — ABNORMAL HIGH (ref 70–99)
Glucose-Capillary: 259 mg/dL — ABNORMAL HIGH (ref 70–99)

## 2013-12-13 LAB — CBC
HEMATOCRIT: 26.7 % — AB (ref 39.0–52.0)
HEMOGLOBIN: 8.4 g/dL — AB (ref 13.0–17.0)
MCH: 27 pg (ref 26.0–34.0)
MCHC: 31.5 g/dL (ref 30.0–36.0)
MCV: 85.9 fL (ref 78.0–100.0)
Platelets: 489 10*3/uL — ABNORMAL HIGH (ref 150–400)
RBC: 3.11 MIL/uL — ABNORMAL LOW (ref 4.22–5.81)
RDW: 14.1 % (ref 11.5–15.5)
WBC: 21.7 10*3/uL — ABNORMAL HIGH (ref 4.0–10.5)

## 2013-12-13 MED ORDER — ONDANSETRON 8 MG/NS 50 ML IVPB
8.0000 mg | Freq: Three times a day (TID) | INTRAVENOUS | Status: DC
  Filled 2013-12-13 (×2): qty 8

## 2013-12-13 MED ORDER — PROMETHAZINE HCL 25 MG/ML IJ SOLN
25.0000 mg | Freq: Four times a day (QID) | INTRAMUSCULAR | Status: DC | PRN
  Administered 2013-12-15 – 2013-12-21 (×10): 25 mg via INTRAVENOUS
  Filled 2013-12-13 (×10): qty 1

## 2013-12-13 MED ORDER — ONDANSETRON 8 MG/NS 50 ML IVPB: 8.0000 mg | Freq: Three times a day (TID) | INTRAVENOUS | Status: DC

## 2013-12-13 MED ORDER — PROMETHAZINE HCL 25 MG/ML IJ SOLN
INTRAMUSCULAR | Status: AC
  Administered 2013-12-13: 25 mg
  Filled 2013-12-13: qty 1

## 2013-12-13 MED ORDER — INSULIN ASPART 100 UNIT/ML ~~LOC~~ SOLN
8.0000 [IU] | Freq: Once | SUBCUTANEOUS | Status: AC
  Administered 2013-12-14: 8 [IU] via SUBCUTANEOUS

## 2013-12-13 MED ORDER — WARFARIN SODIUM 5 MG PO TABS
5.0000 mg | ORAL_TABLET | Freq: Once | ORAL | Status: AC
  Administered 2013-12-13: 5 mg via ORAL
  Filled 2013-12-13: qty 1

## 2013-12-13 MED ORDER — ONDANSETRON HCL 4 MG PO TABS
ORAL_TABLET | ORAL | Status: AC
  Administered 2013-12-14: 4 mg via ORAL
  Filled 2013-12-13: qty 1

## 2013-12-13 NOTE — Progress Notes (Signed)
Called by nursing that the patient was having rigors and had a temp of 103.2 and HR 122. In addition he was having nausea and vomiting. IV phenergan was given. The pt refuse Tylenol suppository but was able to tolerate po Tylenol. UA and Urine cultures, CXR, blood cultures x2 were ordered. On arrival to his room, he was being taken down to Xray but no longer had rigors. In speaking to his nurse, she states that his temperature was rechecked less than 10 minutes after receiving the Tylenol and it was 99.6 and 99.8 on recheck.   The patient is already on broad spectrum antibiotics, Vanc and Zosyn, so ID was curbsided and felt that he likely needed further debridement of the foot and recommended calling Orthopedics in the morning. ID also recommended narrowing antibiotics to just Vancomycin +/- Ancef. The primary team spoke to Dr. Tommy Medal with ID and given his high fever decided to continue both the Vanc and Zosyn tonight.   Will f/u cultures, UA, and CXR for additional source of infection, but the foot wound is likely the source and needs to be further debrided.

## 2013-12-13 NOTE — Progress Notes (Signed)
Subjective: 2 BMs overnight after laxative.  One episode of vomiting this AM.  Wants to de-escalate to clear liquids.  No CP, abdominal pain or SOB.  Still with foot pain s/p I&D.  Objective: Vital signs in last 24 hours: Filed Vitals:   12/12/13 0500 12/12/13 1430 12/12/13 2151 12/13/13 0509  BP: 131/63 136/54 148/62 151/75  Pulse: 98 108  109  Temp: 98.7 F (37.1 C) 101.2 F (38.4 C) 101.2 F (38.4 C) 98.6 F (37 C)  TempSrc: Oral Oral Oral Oral  Resp: 20 18 18    Height:      Weight: 342 lb (155.13 kg)   344 lb (156.037 kg)  SpO2: 93% 98% 92% 97%   Weight change: 2 lb (0.907 kg)  Intake/Output Summary (Last 24 hours) at 12/13/13 0851 Last data filed at 12/13/13 0539  Gross per 24 hour  Intake 3285.58 ml  Output   2025 ml  Net 1260.58 ml   General: NAD HEENT: [place]/AT Cardiac: RRR, no rubs, murmurs or gallops Pulm: clear to auscultation bilaterally, moving normal volumes of air Abd: soft, + BS all 4 quads, nondistended, minimal diffuse tenderness Ext: Dressing in place on R foot, c/d/i.  SCDs in place  Neuro: alert and oriented X3, responding appropriately, follows commands, able to sit up for lung exam  Lab Results: Basic Metabolic Panel:  Recent Labs Lab 12/12/13 0418 12/13/13 0554  NA 135* 134*  K 4.4 4.9  CL 100 100  CO2 20 15*  GLUCOSE 214* 308*  BUN 49* 48*  CREATININE 1.69* 1.82*  CALCIUM 8.5 9.2   Liver Function Tests:  Recent Labs Lab 12/09/13 1559 12/10/13 0258  AST 20 13  ALT 21 17  ALKPHOS 188* 140*  BILITOT 1.0 0.8  PROT 8.4* 7.2  ALBUMIN 2.7* 2.4*   CBC:  Recent Labs Lab 12/09/13 1559  12/12/13 0418 12/13/13 0554  WBC 15.3*  < > 16.5* 21.7*  NEUTROABS 11.9*  --   --   --   HGB 11.0*  < > 8.4* 8.4*  HCT 32.5*  < > 25.9* 26.7*  MCV 83.8  < > 84.1 85.9  PLT 378  < > 439* 489*  < > = values in this interval not displayed. CBG:  Recent Labs Lab 12/11/13 2239 12/12/13 0800 12/12/13 1143 12/12/13 1703 12/12/13 2144  12/13/13 0748  GLUCAP 191* 215* 202* 216* 206* 299*   Coagulation:  Recent Labs Lab 12/10/13 0252 12/11/13 0225 12/12/13 0418 12/13/13 0554  LABPROT 17.1* 20.7* 23.6* 30.0*  INR 1.38 1.76* 2.09* 2.84*   Urine Drug Screen: Drugs of Abuse     Component Value Date/Time   LABOPIA POSITIVE* 07/12/2011 0038   COCAINSCRNUR NONE DETECTED 07/12/2011 0038   LABBENZ NONE DETECTED 07/12/2011 0038   AMPHETMU NONE DETECTED 07/12/2011 0038   THCU NONE DETECTED 07/12/2011 0038   LABBARB NONE DETECTED 07/12/2011 0038    Micro Results: Recent Results (from the past 240 hour(s))  Culture, Blood     Status: None (Preliminary result)   Collection Time: 12/09/13  4:28 PM  Result Value Ref Range Status   Preliminary Report Blood Culture received; No Growth to date;  Preliminary   Preliminary Report Culture will be held for 5 days before issuing  Preliminary   Preliminary Report a Final Negative report.  Preliminary  Culture, Blood     Status: None (Preliminary result)   Collection Time: 12/09/13  4:44 PM  Result Value Ref Range Status   Preliminary Report Blood Culture  received; No Growth to date;  Preliminary   Preliminary Report Culture will be held for 5 days before issuing  Preliminary   Preliminary Report a Final Negative report.  Preliminary   Studies/Results: Dg Abd Portable 1v  12/13/2013   CLINICAL DATA:  Acute onset of abdominal distention. Initial encounter.  EXAM: PORTABLE ABDOMEN - 1 VIEW  COMPARISON:  Lumbar spine radiographs performed 03/31/2013  FINDINGS: The visualized bowel gas pattern is unremarkable. Scattered air filled loops of colon are seen; no abnormal dilatation of small bowel loops is seen to suggest small bowel obstruction. No free intra-abdominal air is identified, though evaluation for free air is limited on a single supine view.  The visualized osseous structures are within normal limits; the sacroiliac joints are unremarkable in appearance.  IMPRESSION:  Unremarkable bowel gas pattern; no free intra-abdominal air seen.   Electronically Signed   By: Garald Balding M.D.   On: 12/13/2013 01:00   Medications: I have reviewed the patient's current medications. Scheduled Meds: . antiseptic oral rinse  7 mL Mouth Rinse q12n4p  . chlorhexidine  15 mL Mouth Rinse BID  . feeding supplement (PRO-STAT SUGAR FREE 64)  30 mL Oral TID WC  . insulin aspart  0-15 Units Subcutaneous TID WC  . insulin aspart protamine- aspart  45 Units Subcutaneous BID WC  . piperacillin-tazobactam (ZOSYN)  IV  3.375 g Intravenous 3 times per day  . polyethylene glycol  17 g Oral Daily  . pravastatin  20 mg Oral q1800  . senna-docusate  1 tablet Oral BID  . vancomycin  2,000 mg Intravenous Q24H  . Warfarin - Pharmacist Dosing Inpatient   Does not apply q1800   Continuous Infusions: . sodium chloride 125 mL/hr at 12/13/13 0539   PRN Meds:.acetaminophen **OR** acetaminophen, dextrose, ondansetron, oxyCODONE-acetaminophen   Assessment/Plan:  Diabetic foot ulcer and abscess, right foot: S/p Bedside I&D 12/12/2013.  WBC initially downtrended but now up 16.5 --> 21.7.  Wound cx growing staph aureus. - Continue IV Vancomycin and 3.375g IV Zosyn Q8hr - IV fluids 125 mL per hour, will consider d/c IVF tomorrow  - Percocet 5/325mg  1-2 tab Q4hr prn pain; may need to add prn IV pain med  - follow-up final wound culture - ortho to re-eval on Monday  Suspected Diabetic ketoacidosis secondary to type 2 diabetes: Resolved.  CBG 299 this AM.  Apparently, he did not eat breakfast or dinner yesterday and did not receive Novolog 70/30 BID.  He will try clears this AM.   - SSI mod, CBG q AC/HS  - Novolog 70/30 45u BID  AKI on CKD stage 2: Cr 2.04 --> 1.69 -->  1.82 (baseline around 1.6)  - Continue IV fluids. Likely d/c IVF tomorrow.  - Avoid renal toxins  Hypertension:   - Holding home lisinopril-hydrochlorothiazide and Lasix 40 mg given AKI  Recurrent DVTs on chronic Coumadin  therapy: - warfarin per pharm  FEN:  - carb modified diet  DVT ppx: SCDs, warfarin  Dispo: Disposition is deferred at this time, awaiting improvement of current medical problems.  Anticipated discharge in approximately 1-2 day(s).   The patient does have a current PCP Lucious Groves, DO) and does need an [facility] hospital follow-up appointment after discharge.  The patient does not have transportation limitations that hinder transportation to clinic appointments.  .Services Needed at time of discharge: Y = Yes, Blank = No PT:   OT:   RN:   Equipment:   Other:     LOS:  4 days   Francesca Oman, DO 12/13/2013, 8:51 AM

## 2013-12-13 NOTE — Progress Notes (Signed)
ANTICOAGULATION CONSULT NOTE - follow up Pharmacy Consult for Warfarin Indication: recurrent DVTs  No Known Allergies  Patient Measurements: Height: 6\' 2"  (188 cm) Weight: (!) 344 lb (156.037 kg) IBW/kg (Calculated) : 82.2 Total Body Weight 154kg  Vital Signs: Temp: 101.5 F (38.6 C) (12/05 1354) Temp Source: Axillary (12/05 1354) BP: 146/75 mmHg (12/05 1354) Pulse Rate: 102 (12/05 1354)  Labs:  Recent Labs  12/11/13 0225 12/12/13 0418 12/13/13 0554  HGB 8.6* 8.4* 8.4*  HCT 27.2* 25.9* 26.7*  PLT 384 439* 489*  LABPROT 20.7* 23.6* 30.0*  INR 1.76* 2.09* 2.84*  CREATININE 2.04* 1.69* 1.82*    Estimated Creatinine Clearance: 87.8 mL/min (by C-G formula based on Cr of 1.82).     Assessment: 38yom admitted for cellulitis.  Has Hx of recurrent DVT.  He is supposed to be on warfarin as outpt but is non-compliant. INR 1.28 on admission. Coumadin held 12/3 for I&D.  INR has trended up to 1.82 today. Large 6.4 sec jump in protime. Hgb 8.4, plt high. No bleeding reported.  Home dose: Per outpatient note last home regimen warfarin 7.5mg  daily and 10mg  Mon and Thur.        Goal of Therapy:  INR 2-3   Plan:  Coumadin 5 mg po x 1 dose today Daily Protime Eudelia Bunch, Pharm.D. BP:[phone] 12/13/2013 2:59 PM coumadin

## 2013-12-14 ENCOUNTER — Inpatient Hospital Stay (HOSPITAL_COMMUNITY): Payer: Medicaid Other

## 2013-12-14 ENCOUNTER — Encounter (HOSPITAL_COMMUNITY)
Admission: AD | Disposition: A | Discharge status: Rehabilitation Facility | Payer: Self-pay | Source: Ambulatory Visit | Attending: Internal Medicine

## 2013-12-14 HISTORY — PX: I & D EXTREMITY: SHX5045

## 2013-12-14 LAB — BASIC METABOLIC PANEL
ANION GAP: 16 — AB (ref 5–15)
BUN: 55 mg/dL — ABNORMAL HIGH (ref 6–23)
CHLORIDE: 106 meq/L (ref 96–112)
CO2: 18 mEq/L — ABNORMAL LOW (ref 19–32)
Calcium: 8.8 mg/dL (ref 8.4–10.5)
Creatinine, Ser: 2.29 mg/dL — ABNORMAL HIGH (ref 0.50–1.35)
GFR calc non Af Amer: 35 mL/min — ABNORMAL LOW (ref >90)
GFR, EST AFRICAN AMERICAN: 40 mL/min — AB (ref >90)
Glucose, Bld: 150 mg/dL — ABNORMAL HIGH (ref 70–99)
POTASSIUM: 4.4 meq/L (ref 3.7–5.3)
Sodium: 140 mEq/L (ref 137–147)

## 2013-12-14 LAB — GLUCOSE, CAPILLARY
GLUCOSE-CAPILLARY: 130 mg/dL — AB (ref 70–99)
GLUCOSE-CAPILLARY: 134 mg/dL — AB (ref 70–99)
GLUCOSE-CAPILLARY: 136 mg/dL — AB (ref 70–99)
GLUCOSE-CAPILLARY: 180 mg/dL — AB (ref 70–99)
Glucose-Capillary: 154 mg/dL — ABNORMAL HIGH (ref 70–99)
Glucose-Capillary: 156 mg/dL — ABNORMAL HIGH (ref 70–99)
Glucose-Capillary: 145 mg/dL — ABNORMAL HIGH (ref 70–99)

## 2013-12-14 LAB — CBC WITH DIFFERENTIAL/PLATELET
Basophils Absolute: 0 10*3/uL (ref 0.0–0.1)
Basophils Relative: 0 % (ref 0–1)
EOS PCT: 0 % (ref 0–5)
Eosinophils Absolute: 0 10*3/uL (ref 0.0–0.7)
HCT: 22.1 % — ABNORMAL LOW (ref 39.0–52.0)
HEMOGLOBIN: 7 g/dL — AB (ref 13.0–17.0)
Lymphocytes Relative: 8 % — ABNORMAL LOW (ref 12–46)
Lymphs Abs: 2.1 10*3/uL (ref 0.7–4.0)
MCH: 28 pg (ref 26.0–34.0)
MCHC: 31.7 g/dL (ref 30.0–36.0)
MCV: 88.4 fL (ref 78.0–100.0)
MONO ABS: 2.9 10*3/uL — AB (ref 0.1–1.0)
Monocytes Relative: 11 % (ref 3–12)
NEUTROS PCT: 81 % — AB (ref 43–77)
Neutro Abs: 21.3 10*3/uL — ABNORMAL HIGH (ref 1.7–7.7)
PLATELETS: 492 10*3/uL — AB (ref 150–400)
RBC: 2.5 MIL/uL — AB (ref 4.22–5.81)
RDW: 14.7 % (ref 11.5–15.5)
WBC: 26.3 10*3/uL — ABNORMAL HIGH (ref 4.0–10.5)

## 2013-12-14 LAB — CBC
HCT: 22.9 % — ABNORMAL LOW (ref 39.0–52.0)
HEMOGLOBIN: 7.3 g/dL — AB (ref 13.0–17.0)
MCH: 28 pg (ref 26.0–34.0)
MCHC: 31.9 g/dL (ref 30.0–36.0)
MCV: 87.7 fL (ref 78.0–100.0)
Platelets: 483 10*3/uL — ABNORMAL HIGH (ref 150–400)
RBC: 2.61 MIL/uL — ABNORMAL LOW (ref 4.22–5.81)
RDW: 14.8 % (ref 11.5–15.5)
WBC: 27.1 10*3/uL — ABNORMAL HIGH (ref 4.0–10.5)

## 2013-12-14 LAB — URINALYSIS, ROUTINE W REFLEX MICROSCOPIC
Glucose, UA: 250 mg/dL — AB
Ketones, ur: 15 mg/dL — AB
NITRITE: NEGATIVE
PH: 5 (ref 5.0–8.0)
Protein, ur: >300 mg/dL — AB
Specific Gravity, Urine: 1.027 (ref 1.005–1.030)
Urobilinogen, UA: 4 mg/dL — ABNORMAL HIGH (ref 0.0–1.0)

## 2013-12-14 LAB — HEPATIC FUNCTION PANEL
ALT: 23 U/L (ref 0–53)
AST: 23 U/L (ref 0–37)
Albumin: 1.9 g/dL — ABNORMAL LOW (ref 3.5–5.2)
Alkaline Phosphatase: 224 U/L — ABNORMAL HIGH (ref 39–117)
BILIRUBIN DIRECT: 1.1 mg/dL — AB (ref 0.0–0.3)
Indirect Bilirubin: 0.5 mg/dL (ref 0.3–0.9)
TOTAL PROTEIN: 7.2 g/dL (ref 6.0–8.3)
Total Bilirubin: 1.6 mg/dL — ABNORMAL HIGH (ref 0.3–1.2)

## 2013-12-14 LAB — URINE MICROSCOPIC-ADD ON

## 2013-12-14 LAB — PROTIME-INR
INR: 5.44 (ref 0.00–1.49)
Prothrombin Time: 50 seconds — ABNORMAL HIGH (ref 11.6–15.2)

## 2013-12-14 LAB — CULTURE, ROUTINE-ABSCESS

## 2013-12-14 LAB — ABO/RH: ABO/RH(D): B POS

## 2013-12-14 SURGERY — IRRIGATION AND DEBRIDEMENT EXTREMITY
Anesthesia: General | Site: Foot | Laterality: Right

## 2013-12-14 MED ORDER — LIDOCAINE HCL (CARDIAC) 20 MG/ML IV SOLN
INTRAVENOUS | Status: AC
  Filled 2013-12-14: qty 5

## 2013-12-14 MED ORDER — SUCCINYLCHOLINE CHLORIDE 20 MG/ML IJ SOLN
INTRAMUSCULAR | Status: AC
  Filled 2013-12-14: qty 1

## 2013-12-14 MED ORDER — ONDANSETRON HCL 4 MG/2ML IJ SOLN
INTRAMUSCULAR | Status: AC
  Filled 2013-12-14: qty 2

## 2013-12-14 MED ORDER — PROPOFOL 10 MG/ML IV BOLUS
INTRAVENOUS | Status: AC
  Filled 2013-12-14: qty 20

## 2013-12-14 MED ORDER — FENTANYL CITRATE 0.05 MG/ML IJ SOLN
INTRAMUSCULAR | Status: AC
  Filled 2013-12-14: qty 5

## 2013-12-14 MED ORDER — MIDAZOLAM HCL 2 MG/2ML IJ SOLN
INTRAMUSCULAR | Status: AC
  Filled 2013-12-14: qty 2

## 2013-12-14 SURGICAL SUPPLY — 52 items
BANDAGE ELASTIC 4 VELCRO ST LF (GAUZE/BANDAGES/DRESSINGS) ×2 IMPLANT
BNDG COHESIVE 4X5 TAN STRL (GAUZE/BANDAGES/DRESSINGS) IMPLANT
BNDG GAUZE ELAST 4 BULKY (GAUZE/BANDAGES/DRESSINGS) ×4 IMPLANT
BRUSH SCRUB DISP (MISCELLANEOUS) IMPLANT
COVER SURGICAL LIGHT HANDLE (MISCELLANEOUS) ×2 IMPLANT
CUFF TOURNIQUET SINGLE 18IN (TOURNIQUET CUFF) ×2 IMPLANT
CUFF TOURNIQUET SINGLE 24IN (TOURNIQUET CUFF) IMPLANT
CUFF TOURNIQUET SINGLE 34IN LL (TOURNIQUET CUFF) IMPLANT
DRAPE IMP U-DRAPE 54X76 (DRAPES) ×2 IMPLANT
DRAPE U-SHAPE 47X51 STRL (DRAPES) ×2 IMPLANT
DRSG ADAPTIC 3X8 NADH LF (GAUZE/BANDAGES/DRESSINGS) IMPLANT
DRSG PAD ABDOMINAL 8X10 ST (GAUZE/BANDAGES/DRESSINGS) ×4 IMPLANT
DURAPREP 26ML APPLICATOR (WOUND CARE) ×4 IMPLANT
ELECT REM PT RETURN 9FT ADLT (ELECTROSURGICAL) ×2
ELECTRODE REM PT RTRN 9FT ADLT (ELECTROSURGICAL) IMPLANT
FACESHIELD WRAPAROUND (MASK) ×2 IMPLANT
GAUZE SPONGE 4X4 12PLY STRL (GAUZE/BANDAGES/DRESSINGS) ×2 IMPLANT
GLOVE BIOGEL PI ORTHO PRO 7.5 (GLOVE)
GLOVE BIOGEL PI ORTHO PRO SZ8 (GLOVE) ×1
GLOVE ORTHO TXT STRL SZ7.5 (GLOVE) IMPLANT
GLOVE PI ORTHO PRO STRL 7.5 (GLOVE) IMPLANT
GLOVE PI ORTHO PRO STRL SZ8 (GLOVE) ×1 IMPLANT
GLOVE SURG ORTHO 8.5 STRL (GLOVE) ×2 IMPLANT
GOWN STRL REUS W/ TWL LRG LVL3 (GOWN DISPOSABLE) ×1 IMPLANT
GOWN STRL REUS W/ TWL XL LVL3 (GOWN DISPOSABLE) ×1 IMPLANT
GOWN STRL REUS W/TWL LRG LVL3 (GOWN DISPOSABLE) ×2
GOWN STRL REUS W/TWL XL LVL3 (GOWN DISPOSABLE) ×2
HANDPIECE INTERPULSE COAX TIP (DISPOSABLE) ×2
IV NS IRRIG 3000ML ARTHROMATIC (IV SOLUTION) ×1 IMPLANT
KIT BASIN OR (CUSTOM PROCEDURE TRAY) ×2 IMPLANT
KIT ROOM TURNOVER OR (KITS) ×2 IMPLANT
MANIFOLD NEPTUNE II (INSTRUMENTS) ×2 IMPLANT
NS IRRIG 1000ML POUR BTL (IV SOLUTION) ×2 IMPLANT
PACK ORTHO EXTREMITY (CUSTOM PROCEDURE TRAY) ×2 IMPLANT
PAD ARMBOARD 7.5X6 YLW CONV (MISCELLANEOUS) ×4 IMPLANT
PENCIL BUTTON HOLSTER BLD 10FT (ELECTRODE) IMPLANT
SET HNDPC FAN SPRY TIP SCT (DISPOSABLE) IMPLANT
SPONGE GAUZE 4X4 12PLY STER LF (GAUZE/BANDAGES/DRESSINGS) ×2 IMPLANT
SPONGE LAP 18X18 X RAY DECT (DISPOSABLE) ×2 IMPLANT
SPONGE LAP 4X18 X RAY DECT (DISPOSABLE) IMPLANT
STOCKINETTE IMPERVIOUS 9X36 MD (GAUZE/BANDAGES/DRESSINGS) IMPLANT
SUT ETHILON 2 0 FS 18 (SUTURE) IMPLANT
SUT VIC AB 2-0 CT1 27 (SUTURE)
SUT VIC AB 2-0 CT1 TAPERPNT 27 (SUTURE) IMPLANT
SWAB COLLECTION DEVICE MRSA (MISCELLANEOUS) ×2 IMPLANT
TOWEL OR 17X24 6PK STRL BLUE (TOWEL DISPOSABLE) IMPLANT
TOWEL OR 17X26 10 PK STRL BLUE (TOWEL DISPOSABLE) ×2 IMPLANT
TUBE ANAEROBIC SPECIMEN COL (MISCELLANEOUS) ×2 IMPLANT
TUBE CONNECTING 12X1/4 (SUCTIONS) ×2 IMPLANT
UNDERPAD 30X30 INCONTINENT (UNDERPADS AND DIAPERS) ×2 IMPLANT
WATER STERILE IRR 1000ML POUR (IV SOLUTION) IMPLANT
YANKAUER SUCT BULB TIP NO VENT (SUCTIONS) ×2 IMPLANT

## 2013-12-14 NOTE — Plan of Care (Signed)
Problem: Phase III Progression Outcomes Goal: Temperature < 100 Outcome: Not Progressing Spike Temp 102.7 temp today

## 2013-12-14 NOTE — Progress Notes (Signed)
Subjective: Febrile as highas 103.62F and 2 episodes of vomiting overnight.  CXR, abd xray and UA unrevealing. No vomiting this AM.  He says he feels better.  No foot pain at this time.  No dyspnea, no diarrhea.  He plans to try eating grits this AM.   Objective: Vital signs in last 24 hours: Filed Vitals:   12/13/13 2248 12/14/13 0137 12/14/13 0529 12/14/13 0714  BP: 139/64 155/72 150/65   Pulse: 110 103 113   Temp: 101.9 F (38.8 C) 99.8 F (37.7 C) 101.4 F (38.6 C)   TempSrc: Oral     Resp: 18 19 17    Height:      Weight:    163.794 kg (361 lb 1.6 oz)  SpO2: 96% 97% 96%    Weight change:   Intake/Output Summary (Last 24 hours) at 12/14/13 0801 Last data filed at 12/14/13 0210  Gross per 24 hour  Intake      0 ml  Output    900 ml  Net   -900 ml   General: NAD HEENT: [place]/AT Cardiac: RRR, no rubs, murmurs or gallops Pulm: clear to auscultation bilaterally, moving normal volumes of air Abd: soft, + BS, nondistended, minimal diffuse tenderness (he says improved from yesterday) Ext: +1 B/L LE edema, Dressing in place on R foot, some blood noted on dressing, wound is packed, minimally TTP, L SCD in place  Neuro: alert and oriented X3, responding appropriately, follows commands, able to sit up for lung exam  Lab Results: Basic Metabolic Panel:  Recent Labs Lab 12/13/13 0554 12/13/13 2037  NA 134* 133*  K 4.9 4.8  CL 100 98  CO2 15* 19  GLUCOSE 308* 309*  BUN 48* 53*  CREATININE 1.82* 2.06*  CALCIUM 9.2 9.0   Liver Function Tests:  Recent Labs Lab 12/09/13 1559 12/10/13 0258  AST 20 13  ALT 21 17  ALKPHOS 188* 140*  BILITOT 1.0 0.8  PROT 8.4* 7.2  ALBUMIN 2.7* 2.4*   CBC:  Recent Labs Lab 12/09/13 1559  12/12/13 0418 12/13/13 0554  WBC 15.3*  < > 16.5* 21.7*  NEUTROABS 11.9*  --   --   --   HGB 11.0*  < > 8.4* 8.4*  HCT 32.5*  < > 25.9* 26.7*  MCV 83.8  < > 84.1 85.9  PLT 378  < > 439* 489*  < > = values in this interval not  displayed. CBG:  Recent Labs Lab 12/13/13 0748 12/13/13 1201 12/13/13 1649 12/13/13 2235 12/14/13 0123 12/14/13 0729  GLUCAP 299* 359* 364* 259* 154* 136*   Coagulation:  Recent Labs Lab 12/10/13 0252 12/11/13 0225 12/12/13 0418 12/13/13 0554  LABPROT 17.1* 20.7* 23.6* 30.0*  INR 1.38 1.76* 2.09* 2.84*   Urine Drug Screen: Drugs of Abuse     Component Value Date/Time   LABOPIA POSITIVE* 07/12/2011 0038   COCAINSCRNUR NONE DETECTED 07/12/2011 0038   LABBENZ NONE DETECTED 07/12/2011 0038   AMPHETMU NONE DETECTED 07/12/2011 0038   THCU NONE DETECTED 07/12/2011 0038   LABBARB NONE DETECTED 07/12/2011 0038    Micro Results: Recent Results (from the past 240 hour(s))  Culture, Blood     Status: None (Preliminary result)   Collection Time: 12/09/13  4:28 PM  Result Value Ref Range Status   Preliminary Report Blood Culture received; No Growth to date;  Preliminary   Preliminary Report Culture will be held for 5 days before issuing  Preliminary   Preliminary Report a Final Negative report.  Preliminary  Culture, Blood     Status: None (Preliminary result)   Collection Time: 12/09/13  4:44 PM  Result Value Ref Range Status   Preliminary Report Blood Culture received; No Growth to date;  Preliminary   Preliminary Report Culture will be held for 5 days before issuing  Preliminary   Preliminary Report a Final Negative report.  Preliminary  Anaerobic culture     Status: None (Preliminary result)   Collection Time: 12/12/13  2:29 AM  Result Value Ref Range Status   Specimen Description ABSCESS RIGHT FOOT  Final   Special Requests NONE  Final   Gram Stain   Final    ABUNDANT WBC PRESENT,BOTH PMN AND MONONUCLEAR NO SQUAMOUS EPITHELIAL CELLS SEEN RARE GRAM POSITIVE COCCI IN PAIRS IN CLUSTERS Performed at [facility]    Culture   Final    NO ANAEROBES ISOLATED; CULTURE IN PROGRESS FOR 5 DAYS Performed at [facility]    Report Status PENDING  Incomplete   Culture, routine-abscess     Status: None (Preliminary result)   Collection Time: 12/12/13  2:29 AM  Result Value Ref Range Status   Specimen Description ABSCESS RIGHT FOOT  Final   Special Requests NONE  Final   Gram Stain   Final    ABUNDANT WBC PRESENT,BOTH PMN AND MONONUCLEAR NO SQUAMOUS EPITHELIAL CELLS SEEN RARE GRAM POSITIVE COCCI IN PAIRS IN CLUSTERS Performed at [facility]    Culture   Final    FEW STAPHYLOCOCCUS AUREUS Note: RIFAMPIN AND GENTAMICIN SHOULD NOT BE USED AS SINGLE DRUGS FOR TREATMENT OF STAPH INFECTIONS. Performed at [facility]    Report Status PENDING  Incomplete   Studies/Results: Dg Chest 2 View  12/13/2013   CLINICAL DATA:  Acute onset of nausea, fever and shortness of breath for 1 week. Initial encounter.  EXAM: CHEST  2 VIEW  COMPARISON:  Chest radiograph performed 02/19/2013  FINDINGS: The lungs are significantly hypoexpanded, with vascular crowding. There is no evidence of focal opacification, pleural effusion or pneumothorax. The lateral view is suboptimal due to lung hypoexpansion.  The heart is borderline normal in size; the mediastinal contour is within normal limits. No acute osseous abnormalities are seen.  IMPRESSION: Lungs significantly hypoexpanded but grossly clear.   Electronically Signed   By: Garald Balding M.D.   On: 12/13/2013 22:47   Dg Abd 1 View  12/13/2013   CLINICAL DATA:  Acute onset of fever, shortness of breath and nausea for 1 week. Subsequent encounter.  EXAM: ABDOMEN - 1 VIEW  COMPARISON:  Abdominal radiograph performed 12/12/2013  FINDINGS: The visualized bowel gas pattern is unremarkable. Scattered air filled loops of colon are seen; no abnormal dilatation of small bowel loops is seen to suggest small bowel obstruction. No free intra-abdominal air is identified, though evaluation for free air is limited on a single supine view.  The visualized osseous structures are within normal limits; the sacroiliac joints are  unremarkable in appearance.  IMPRESSION: Unremarkable bowel gas pattern; no free intra-abdominal air seen.   Electronically Signed   By: Garald Balding M.D.   On: 12/13/2013 22:48   Dg Abd Portable 1v  12/13/2013   CLINICAL DATA:  Acute onset of abdominal distention. Initial encounter.  EXAM: PORTABLE ABDOMEN - 1 VIEW  COMPARISON:  Lumbar spine radiographs performed 03/31/2013  FINDINGS: The visualized bowel gas pattern is unremarkable. Scattered air filled loops of colon are seen; no abnormal dilatation of small bowel loops is seen to  suggest small bowel obstruction. No free intra-abdominal air is identified, though evaluation for free air is limited on a single supine view.  The visualized osseous structures are within normal limits; the sacroiliac joints are unremarkable in appearance.  IMPRESSION: Unremarkable bowel gas pattern; no free intra-abdominal air seen.   Electronically Signed   By: Garald Balding M.D.   On: 12/13/2013 01:00   Medications: I have reviewed the patient's current medications. Scheduled Meds: . antiseptic oral rinse  7 mL Mouth Rinse q12n4p  . chlorhexidine  15 mL Mouth Rinse BID  . feeding supplement (PRO-STAT SUGAR FREE 64)  30 mL Oral TID WC  . insulin aspart  0-15 Units Subcutaneous TID WC  . insulin aspart protamine- aspart  45 Units Subcutaneous BID WC  . piperacillin-tazobactam (ZOSYN)  IV  3.375 g Intravenous 3 times per day  . polyethylene glycol  17 g Oral Daily  . pravastatin  20 mg Oral q1800  . senna-docusate  1 tablet Oral BID  . vancomycin  2,000 mg Intravenous Q24H  . Warfarin - Pharmacist Dosing Inpatient   Does not apply q1800   Continuous Infusions: . sodium chloride 125 mL/hr at 12/13/13 0539   PRN Meds:.acetaminophen **OR** acetaminophen, dextrose, ondansetron, oxyCODONE-acetaminophen, promethazine   Assessment/Plan:  Diabetic foot ulcer and abscess, right foot: S/p Bedside I&D 12/11/2013.  Wound cx growing staph aureus.  Today, the site is  packed, minimally tender, no fluctuance or drainage.  WBC initially downtrended but now up 16.5 --> 26.3.  He continues to spike fevers despite broad spectrum abx.  I spoke with ID over the phone yesterday and they recommend asking ortho to see the patient again for further debridement.  ID also recommends narrowing abx since culture is growing staph aureus.  Appreciated recs by ortho and ID.  - re-consult ortho today since wound potentially needs additional debridement - continue IV Vancomycin and IV Zosyn given the fact that he is still febrile with increasing WBC - IV fluids decrease rate from 125 - 50 mL/hr since his weight is increasing and he is tolerating fluids  - Percocet 5/325mg  1-2 tab q4hr prn pain; I have asked him to let RN know if pain uncontrolled as we may need to add prn IV pain med  - follow-up final wound culture   Suspected Diabetic ketoacidosis secondary to type 2 diabetes: Resolved.  CBG 136 this AM.   He received 45 units of Novolog 70/30 last night. He plans to eat clears and try grits this AM.   - SSI mod, CBG q AC/HS  - Novolog 70/30 45u BID - only 20 units this AM with breakfast since CBG near normal and patient eating clears/soft diet  - repeat CBG 1 hour after AM insulin   AKI on CKD stage 2: Cr 2.04 --> 1.69 -->  1.82 (baseline around 1.6); AM labs pending  - Continue IV fluids. Likely d/c IVF tomorrow.  - Avoid renal toxins   Hypertension:   - Holding home lisinopril-hydrochlorothiazide and Lasix 40 mg given AKI.  Recurrent DVTs on chronic Coumadin therapy: - warfarin per pharm  FEN:  - carb modified diet  DVT ppx: SCDs, warfarin  Dispo: Disposition is deferred at this time, awaiting improvement of current medical problems.  Anticipated discharge in approximately 1-2 day(s).   The patient does have a current PCP Lucious Groves, DO) and does need an [facility] hospital follow-up appointment after discharge.  The patient does not have transportation  limitations that hinder transportation to  clinic appointments.  .Services Needed at time of discharge: Y = Yes, Blank = No PT:   OT:   RN:   Equipment:   Other:     LOS: 5 days   Francesca Oman, DO 12/14/2013, 8:01 AM

## 2013-12-14 NOTE — Progress Notes (Signed)
Orthopedics Progress Note  Subjective: Patient still complains of right foot pain  Objective:  Filed Vitals:   12/14/13 0529  BP: 150/65  Pulse: 113  Temp: 101.4 F (38.6 C)  Resp: 17    General: Awake and alert  Musculoskeletal: right foot and ankle swollen and tender, 1 cm ulcer (hole) on the plantar aspect of the foot that had iodoform packing in place. Removed the packing and only a small amount of blood came out. No foul smell, no purulent drainage noted. Repacked with iodoform gauze to control the dead space.  Neurovascularly intact  Lab Results  Component Value Date   WBC 26.3* 12/14/2013   HGB 7.0* 12/14/2013   HCT 22.1* 12/14/2013   MCV 88.4 12/14/2013   PLT 492* 12/14/2013       Component Value Date/Time   NA 140 12/14/2013 0738   K 4.4 12/14/2013 0738   CL 106 12/14/2013 0738   CO2 18* 12/14/2013 0738   GLUCOSE 150* 12/14/2013 0738   BUN 55* 12/14/2013 0738   CREATININE 2.29* 12/14/2013 0738   CREATININE 1.78* 12/09/2013 1559   CALCIUM 8.8 12/14/2013 0738   GFRNONAA 35* 12/14/2013 0738   GFRNONAA 48* 12/09/2013 1559   GFRAA 40* 12/14/2013 0738   GFRAA 55* 12/09/2013 1559    Lab Results  Component Value Date   INR 5.44* 12/14/2013   INR 2.84* 12/13/2013   INR 2.09* 12/12/2013    Assessment/Plan:  s/p bedside I+D of the right foot for a diabetic abscess on the plantar aspect of the foot with elevation in WBC which is concerning considering that the abscess has bee decompressed surgically. Consider repeating MRI scan of the right foot to look for any ongoing abscess in the foot   Doran Heater. Veverly Fells, MD 12/14/2013 12:04 PM

## 2013-12-14 NOTE — Progress Notes (Signed)
ANTICOAGULATION CONSULT NOTE - follow up Pharmacy Consult for Warfarin Indication: recurrent DVTs  No Known Allergies  Patient Measurements: Height: 6\' 2"  (188 cm) Weight: (!) 361 lb 1.6 oz (163.794 kg) IBW/kg (Calculated) : 82.2 Total Body Weight 154kg  Vital Signs: Temp: 99.1 F (37.3 C) (12/06 1222) Temp Source: Oral (12/06 1222) BP: 125/61 mmHg (12/06 1222) Pulse Rate: 102 (12/06 1222)  Labs:  Recent Labs  12/12/13 0418 12/13/13 0554 12/13/13 2037 12/14/13 0738  HGB 8.4* 8.4*  --  7.0*  HCT 25.9* 26.7*  --  22.1*  PLT 439* 489*  --  492*  LABPROT 23.6* 30.0*  --  50.0*  INR 2.09* 2.84*  --  5.44*  CREATININE 1.69* 1.82* 2.06* 2.29*    Estimated Creatinine Clearance: 71.7 mL/min (by C-G formula based on Cr of 2.29).     Assessment: 38yom admitted for cellulitis.  Has Hx of recurrent DVT.  He is supposed to be on warfarin as outpt but is non-compliant. INR 1.28 on admission. Coumadin held 12/3 for I&D.  INR up to 5.44 today. Large 20 sec jump in protime. Hgb down to 7, plt high. Ortho reported only a small amount of blood when packing removed from foot wound.  Home dose: Per outpatient note last home regimen warfarin 7.5mg  daily and 10mg  Mon and Thur.        Goal of Therapy:  INR 2-3   Plan:  Hold coumadin today Daily Protime  Eudelia Bunch, Pharm.D. [id number] 12/14/2013 2:21 PM

## 2013-12-15 ENCOUNTER — Inpatient Hospital Stay (HOSPITAL_COMMUNITY): Payer: Medicaid Other | Admitting: Anesthesiology

## 2013-12-15 ENCOUNTER — Encounter (HOSPITAL_COMMUNITY): Payer: Self-pay | Admitting: Orthopedic Surgery

## 2013-12-15 DIAGNOSIS — A419 Sepsis, unspecified organism: Principal | ICD-10-CM

## 2013-12-15 DIAGNOSIS — R109 Unspecified abdominal pain: Secondary | ICD-10-CM

## 2013-12-15 DIAGNOSIS — D649 Anemia, unspecified: Secondary | ICD-10-CM

## 2013-12-15 DIAGNOSIS — R197 Diarrhea, unspecified: Secondary | ICD-10-CM

## 2013-12-15 LAB — BASIC METABOLIC PANEL
Anion gap: 14 (ref 5–15)
BUN: 53 mg/dL — ABNORMAL HIGH (ref 6–23)
CALCIUM: 8.7 mg/dL (ref 8.4–10.5)
CO2: 17 meq/L — AB (ref 19–32)
CREATININE: 2.19 mg/dL — AB (ref 0.50–1.35)
Chloride: 104 mEq/L (ref 96–112)
GFR calc Af Amer: 42 mL/min — ABNORMAL LOW (ref >90)
GFR calc non Af Amer: 37 mL/min — ABNORMAL LOW (ref >90)
GLUCOSE: 174 mg/dL — AB (ref 70–99)
Potassium: 4.9 mEq/L (ref 3.7–5.3)
Sodium: 135 mEq/L — ABNORMAL LOW (ref 137–147)

## 2013-12-15 LAB — GLUCOSE, CAPILLARY
GLUCOSE-CAPILLARY: 175 mg/dL — AB (ref 70–99)
GLUCOSE-CAPILLARY: 179 mg/dL — AB (ref 70–99)
GLUCOSE-CAPILLARY: 169 mg/dL — AB (ref 70–99)
GLUCOSE-CAPILLARY: 142 mg/dL — AB (ref 70–99)
GLUCOSE-CAPILLARY: 109 mg/dL — AB (ref 70–99)
Glucose-Capillary: 143 mg/dL — ABNORMAL HIGH (ref 70–99)

## 2013-12-15 LAB — CBC WITH DIFFERENTIAL/PLATELET
BASOS ABS: 0 10*3/uL (ref 0.0–0.1)
BASOS PCT: 0 % (ref 0–1)
EOS ABS: 0.3 10*3/uL (ref 0.0–0.7)
Eosinophils Relative: 1 % (ref 0–5)
HEMATOCRIT: 21.7 % — AB (ref 39.0–52.0)
HEMOGLOBIN: 6.7 g/dL — AB (ref 13.0–17.0)
Lymphocytes Relative: 9 % — ABNORMAL LOW (ref 12–46)
Lymphs Abs: 2.3 10*3/uL (ref 0.7–4.0)
MCH: 27 pg (ref 26.0–34.0)
MCHC: 30.9 g/dL (ref 30.0–36.0)
MCV: 87.5 fL (ref 78.0–100.0)
MONO ABS: 3.1 10*3/uL — AB (ref 0.1–1.0)
Monocytes Relative: 12 % (ref 3–12)
NEUTROS ABS: 20.3 10*3/uL — AB (ref 1.7–7.7)
Neutrophils Relative %: 78 % — ABNORMAL HIGH (ref 43–77)
Platelets: 498 10*3/uL — ABNORMAL HIGH (ref 150–400)
RBC: 2.48 MIL/uL — ABNORMAL LOW (ref 4.22–5.81)
RDW: 15.1 % (ref 11.5–15.5)
WBC: 26 10*3/uL — ABNORMAL HIGH (ref 4.0–10.5)

## 2013-12-15 LAB — TECHNOLOGIST SMEAR REVIEW

## 2013-12-15 LAB — URINE CULTURE
COLONY COUNT: NO GROWTH
Culture: NO GROWTH

## 2013-12-15 LAB — URINALYSIS W MICROSCOPIC (NOT AT ARMC)
GLUCOSE, UA: NEGATIVE mg/dL
KETONES UR: NEGATIVE mg/dL
Nitrite: NEGATIVE
PH: 5 (ref 5.0–8.0)
Protein, ur: >300 mg/dL — AB
Specific Gravity, Urine: 1.017 (ref 1.005–1.030)
Urobilinogen, UA: 1 mg/dL (ref 0.0–1.0)

## 2013-12-15 LAB — PREPARE RBC (CROSSMATCH)

## 2013-12-15 LAB — PROTIME-INR
INR: 6.23 (ref 0.00–1.49)
Prothrombin Time: 55.5 seconds — ABNORMAL HIGH (ref 11.6–15.2)

## 2013-12-15 LAB — TRANSFUSION REACTION
DAT C3: NEGATIVE
POST RXN DAT IGG: NEGATIVE

## 2013-12-15 LAB — RETICULOCYTES
RBC.: 2.55 MIL/uL — ABNORMAL LOW (ref 4.22–5.81)
RETIC COUNT ABSOLUTE: 99.5 10*3/uL (ref 19.0–186.0)
RETIC CT PCT: 3.9 % — AB (ref 0.4–3.1)

## 2013-12-15 LAB — SURGICAL PCR SCREEN
MRSA, PCR: NEGATIVE
STAPHYLOCOCCUS AUREUS: POSITIVE — AB

## 2013-12-15 LAB — FOLATE: Folate: >20 ng/mL

## 2013-12-15 LAB — HAPTOGLOBIN: Haptoglobin: 553 mg/dL — ABNORMAL HIGH (ref 45–215)

## 2013-12-15 LAB — HEPATIC FUNCTION PANEL
ALBUMIN: 1.9 g/dL — AB (ref 3.5–5.2)
ALK PHOS: 261 U/L — AB (ref 39–117)
ALT: 26 U/L (ref 0–53)
AST: 24 U/L (ref 0–37)
BILIRUBIN DIRECT: 0.8 mg/dL — AB (ref 0.0–0.3)
BILIRUBIN TOTAL: 1.2 mg/dL (ref 0.3–1.2)
Indirect Bilirubin: 0.4 mg/dL (ref 0.3–0.9)
Total Protein: 7.9 g/dL (ref 6.0–8.3)

## 2013-12-15 LAB — IRON AND TIBC
IRON: 19 ug/dL — AB (ref 42–135)
Saturation Ratios: 12 % — ABNORMAL LOW (ref 20–55)
TIBC: 157 ug/dL — ABNORMAL LOW (ref 215–435)
UIBC: 138 ug/dL (ref 125–400)

## 2013-12-15 LAB — CULTURE, BLOOD (SINGLE)
Organism ID, Bacteria: NO GROWTH
Organism ID, Bacteria: NO GROWTH

## 2013-12-15 LAB — FERRITIN: FERRITIN: 3604 ng/mL — AB (ref 22–322)

## 2013-12-15 LAB — LACTATE DEHYDROGENASE: LDH: 377 U/L — AB (ref 94–250)

## 2013-12-15 LAB — VITAMIN B12: Vitamin B-12: 314 pg/mL (ref 211–911)

## 2013-12-15 MED ORDER — SODIUM CHLORIDE 0.9 % IV SOLN
Freq: Once | INTRAVENOUS | Status: AC
  Administered 2013-12-15: 21:00:00 via INTRAVENOUS

## 2013-12-15 MED ORDER — GLUCERNA SHAKE PO LIQD
237.0000 mL | Freq: Two times a day (BID) | ORAL | Status: DC
  Administered 2013-12-15 – 2013-12-25 (×8): 237 mL via ORAL

## 2013-12-15 MED ORDER — ACETAMINOPHEN 325 MG PO TABS
650.0000 mg | ORAL_TABLET | Freq: Once | ORAL | Status: AC
  Administered 2013-12-15: 650 mg via ORAL
  Filled 2013-12-15: qty 2

## 2013-12-15 MED ORDER — SODIUM CHLORIDE 0.9 % IR SOLN
Status: DC | PRN
  Administered 2013-12-15: 3000 mL

## 2013-12-15 MED ORDER — MIDAZOLAM HCL 5 MG/5ML IJ SOLN
INTRAMUSCULAR | Status: DC | PRN
  Administered 2013-12-15: 2 mg via INTRAVENOUS

## 2013-12-15 MED ORDER — PROPOFOL 10 MG/ML IV BOLUS
INTRAVENOUS | Status: DC | PRN
  Administered 2013-12-15: 250 mg via INTRAVENOUS

## 2013-12-15 MED ORDER — SUCCINYLCHOLINE CHLORIDE 20 MG/ML IJ SOLN
INTRAMUSCULAR | Status: DC | PRN
  Administered 2013-12-15: 140 mg via INTRAVENOUS

## 2013-12-15 MED ORDER — PROMETHAZINE HCL 25 MG/ML IJ SOLN: 6.2500 mg | INTRAMUSCULAR | Status: DC | PRN

## 2013-12-15 MED ORDER — LIDOCAINE HCL (CARDIAC) 20 MG/ML IV SOLN
INTRAVENOUS | Status: DC | PRN
  Administered 2013-12-15: 100 mg via INTRAVENOUS

## 2013-12-15 MED ORDER — FUROSEMIDE 10 MG/ML IJ SOLN
20.0000 mg | Freq: Once | INTRAMUSCULAR | Status: AC
  Administered 2013-12-16: 20 mg via INTRAVENOUS
  Filled 2013-12-15: qty 2

## 2013-12-15 MED ORDER — HYDROMORPHONE HCL 1 MG/ML IJ SOLN: 0.2500 mg | INTRAMUSCULAR | Status: DC | PRN

## 2013-12-15 MED ORDER — FENTANYL CITRATE 0.05 MG/ML IJ SOLN
INTRAMUSCULAR | Status: DC | PRN
  Administered 2013-12-15 (×2): 100 ug via INTRAVENOUS
  Administered 2013-12-15: 50 ug via INTRAVENOUS

## 2013-12-15 MED ORDER — OXYCODONE HCL 5 MG/5ML PO SOLN: 5.0000 mg | Freq: Once | ORAL | Status: DC | PRN

## 2013-12-15 MED ORDER — CHLORHEXIDINE GLUCONATE 4 % EX LIQD
60.0000 mL | Freq: Once | CUTANEOUS | Status: DC
  Filled 2013-12-15: qty 60

## 2013-12-15 MED ORDER — FUROSEMIDE 10 MG/ML IJ SOLN
20.0000 mg | Freq: Once | INTRAMUSCULAR | Status: AC
Start: 2013-12-15 — End: 2013-12-15
  Administered 2013-12-15: 20 mg via INTRAVENOUS
  Filled 2013-12-15: qty 2

## 2013-12-15 MED ORDER — SENNOSIDES-DOCUSATE SODIUM 8.6-50 MG PO TABS: 1.0000 | ORAL_TABLET | Freq: Every day | ORAL | Status: DC | PRN

## 2013-12-15 MED ORDER — SODIUM CHLORIDE 0.9 % IV SOLN
Freq: Once | INTRAVENOUS | Status: AC
  Administered 2013-12-15: 12:00:00 via INTRAVENOUS

## 2013-12-15 MED ORDER — ONDANSETRON HCL 4 MG/2ML IJ SOLN
INTRAMUSCULAR | Status: DC | PRN
  Administered 2013-12-15: 4 mg via INTRAVENOUS

## 2013-12-15 MED ORDER — OXYCODONE HCL 5 MG PO TABS: 5.0000 mg | ORAL_TABLET | Freq: Once | ORAL | Status: DC | PRN

## 2013-12-15 NOTE — Progress Notes (Signed)
Subjective: S/p I&D R foot last night. Feeling better today. Reports his pain is controlled on PO pain meds. Denies chest pain, shortness of breath. He had emesis last night post op. Denies nausea this AM. Has periumbilical abdominal pain. Also reports having diarrhea.  Objective: Vital signs in last 24 hours: Filed Vitals:   12/15/13 0156 12/15/13 0213 12/15/13 0610 12/15/13 1007  BP: 139/65 135/61 134/69 163/88  Pulse: 103 106 104 107  Temp:  98.7 F (37.1 C) 98.2 F (36.8 C) 98.6 F (37 C)  TempSrc:    Oral  Resp: 21 21 18 19   Height:      Weight:   365 lb 6.4 oz (165.744 kg)   SpO2: 96% 98% 98% 95%   Weight change:   Intake/Output Summary (Last 24 hours) at 12/15/13 1157 Last data filed at 12/15/13 0129  Gross per 24 hour  Intake    300 ml  Output   1211 ml  Net   -911 ml   General: NAD HEENT: [place]/AT Cardiac: RRR, no rubs, murmurs or gallops Pulm: clear to auscultation bilaterally, moving normal volumes of air Abd: soft, + BS, nondistended, minimal diffuse tenderness Ext: +1 B/L LE edema, Dressing in place on R foot, some blood noted on dressing, wound is packed, minimally TTP, L SCD in place  Neuro: alert and oriented X3, responding appropriately, follows commands, able to sit up for lung exam  Lab Results: Basic Metabolic Panel:  Recent Labs Lab 12/14/13 0738 12/15/13 0802  NA 140 135*  K 4.4 4.9  CL 106 104  CO2 18* 17*  GLUCOSE 150* 174*  BUN 55* 53*  CREATININE 2.29* 2.19*  CALCIUM 8.8 8.7   Liver Function Tests:  Recent Labs Lab 12/10/13 0258 12/14/13 0738  AST 13 23  ALT 17 23  ALKPHOS 140* 224*  BILITOT 0.8 1.6*  PROT 7.2 7.2  ALBUMIN 2.4* 1.9*   CBC:  Recent Labs Lab 12/14/13 0738 12/14/13 1923 12/15/13 0802  WBC 26.3* 27.1* 26.0*  NEUTROABS 21.3*  --  20.3*  HGB 7.0* 7.3* 6.7*  HCT 22.1* 22.9* 21.7*  MCV 88.4 87.7 87.5  PLT 492* 483* 498*   CBG:  Recent Labs Lab 12/14/13 1645 12/14/13 2208 12/14/13 2355  12/15/13 0127 12/15/13 0801 12/15/13 1033  GLUCAP 145* 180* 134* 143* 175* 179*   Coagulation:  Recent Labs Lab 12/12/13 0418 12/13/13 0554 12/14/13 0738 12/15/13 0802  LABPROT 23.6* 30.0* 50.0* 55.5*  INR 2.09* 2.84* 5.44* 6.23*   Urine Drug Screen: Drugs of Abuse     Component Value Date/Time   LABOPIA POSITIVE* 07/12/2011 0038   COCAINSCRNUR NONE DETECTED 07/12/2011 0038   LABBENZ NONE DETECTED 07/12/2011 0038   AMPHETMU NONE DETECTED 07/12/2011 0038   THCU NONE DETECTED 07/12/2011 0038   LABBARB NONE DETECTED 07/12/2011 0038    Micro Results: Recent Results (from the past 240 hour(s))  Culture, Blood     Status: None   Collection Time: 12/09/13  4:28 PM  Result Value Ref Range Status   Organism ID, Bacteria NO GROWTH 5 DAYS  Final  Culture, Blood     Status: None   Collection Time: 12/09/13  4:44 PM  Result Value Ref Range Status   Organism ID, Bacteria NO GROWTH 5 DAYS  Final  Anaerobic culture     Status: None (Preliminary result)   Collection Time: 12/12/13  2:29 AM  Result Value Ref Range Status   Specimen Description ABSCESS RIGHT FOOT  Final   Special  Requests NONE  Final   Gram Stain   Final    ABUNDANT WBC PRESENT,BOTH PMN AND MONONUCLEAR NO SQUAMOUS EPITHELIAL CELLS SEEN RARE GRAM POSITIVE COCCI IN PAIRS IN CLUSTERS Performed at [facility]    Culture   Final    NO ANAEROBES ISOLATED; CULTURE IN PROGRESS FOR 5 DAYS Performed at [facility]    Report Status PENDING  Incomplete  Culture, routine-abscess     Status: None   Collection Time: 12/12/13  2:29 AM  Result Value Ref Range Status   Specimen Description ABSCESS RIGHT FOOT  Final   Special Requests NONE  Final   Gram Stain   Final    ABUNDANT WBC PRESENT,BOTH PMN AND MONONUCLEAR NO SQUAMOUS EPITHELIAL CELLS SEEN RARE GRAM POSITIVE COCCI IN PAIRS IN CLUSTERS Performed at [facility]    Culture   Final    FEW METHICILLIN RESISTANT STAPHYLOCOCCUS AUREUS Note:  RIFAMPIN AND GENTAMICIN SHOULD NOT BE USED AS SINGLE DRUGS FOR TREATMENT OF STAPH INFECTIONS. This organism DOES NOT demonstrate inducible Clindamycin resistance in vitro. CRITICAL RESULT CALLED TO, READ BACK BY AND VERIFIED WITH: DARLA T 12/6 @  945 BY REAMM Performed at [facility]    Report Status 12/14/2013 FINAL  Final   Organism ID, Bacteria METHICILLIN RESISTANT STAPHYLOCOCCUS AUREUS  Final      Susceptibility   Methicillin resistant staphylococcus aureus - MIC*    CLINDAMYCIN <=0.25 SENSITIVE Sensitive     ERYTHROMYCIN >=8 RESISTANT Resistant     GENTAMICIN <=0.5 SENSITIVE Sensitive     LEVOFLOXACIN 4 INTERMEDIATE Intermediate     OXACILLIN >=4 RESISTANT Resistant     PENICILLIN >=0.5 RESISTANT Resistant     RIFAMPIN <=0.5 SENSITIVE Sensitive     TRIMETH/SULFA <=10 SENSITIVE Sensitive     VANCOMYCIN 1 SENSITIVE Sensitive     TETRACYCLINE <=1 SENSITIVE Sensitive     * FEW METHICILLIN RESISTANT STAPHYLOCOCCUS AUREUS  Culture, blood (routine x 2)     Status: None (Preliminary result)   Collection Time: 12/12/13  7:03 PM  Result Value Ref Range Status   Specimen Description BLOOD RIGHT ARM  Final   Special Requests BOTTLES DRAWN AEROBIC AND ANAEROBIC 10CC  Final   Culture  Setup Time   Final    12/13/2013 01:04 Performed at [facility]    Culture   Final           BLOOD CULTURE RECEIVED NO GROWTH TO DATE CULTURE WILL BE HELD FOR 5 DAYS BEFORE ISSUING A FINAL NEGATIVE REPORT Performed at [facility]    Report Status PENDING  Incomplete  Culture, blood (routine x 2)     Status: None (Preliminary result)   Collection Time: 12/12/13  7:15 PM  Result Value Ref Range Status   Specimen Description BLOOD LEFT HAND  Final   Special Requests BOTTLES DRAWN AEROBIC AND ANAEROBIC 10CC  Final   Culture  Setup Time   Final    12/13/2013 01:04 Performed at [facility]    Culture   Final           BLOOD CULTURE RECEIVED NO GROWTH TO DATE CULTURE WILL  BE HELD FOR 5 DAYS BEFORE ISSUING A FINAL NEGATIVE REPORT Performed at [facility]    Report Status PENDING  Incomplete  Culture, blood (routine x 2)     Status: None (Preliminary result)   Collection Time: 12/13/13  8:47 PM  Result Value Ref Range Status   Specimen Description  BLOOD RIGHT ARM  Final   Special Requests BOTTLES DRAWN AEROBIC ONLY 10CC  Final   Culture  Setup Time   Final    12/14/2013 03:54 Performed at [facility]    Culture   Final           BLOOD CULTURE RECEIVED NO GROWTH TO DATE CULTURE WILL BE HELD FOR 5 DAYS BEFORE ISSUING A FINAL NEGATIVE REPORT Performed at [facility]    Report Status PENDING  Incomplete  Culture, blood (routine x 2)     Status: None (Preliminary result)   Collection Time: 12/13/13  9:03 PM  Result Value Ref Range Status   Specimen Description BLOOD RIGHT ARM  Final   Special Requests BOTTLES DRAWN AEROBIC ONLY 2CC  Final   Culture  Setup Time   Final    12/14/2013 03:54 Performed at [facility]    Culture   Final           BLOOD CULTURE RECEIVED NO GROWTH TO DATE CULTURE WILL BE HELD FOR 5 DAYS BEFORE ISSUING A FINAL NEGATIVE REPORT Performed at [facility]    Report Status PENDING  Incomplete  Surgical pcr screen     Status: Abnormal   Collection Time: 12/15/13 12:00 AM  Result Value Ref Range Status   MRSA, PCR NEGATIVE NEGATIVE Final   Staphylococcus aureus POSITIVE (A) NEGATIVE Final    Comment:        The Xpert SA Assay (FDA approved for NASAL specimens in patients over 20 years of age), is one component of a comprehensive surveillance program.  Test performance has been validated by [employer] for patients greater than or equal to 72 year old. It is not intended to diagnose infection nor to guide or monitor treatment. RESULT CALLED TO, READ BACK BY AND VERIFIED WITH: C OGIN AJALA,RN 12/15/13 0226 BY RHOLMES    Studies/Results: Dg Chest 2 View  12/13/2013   CLINICAL  DATA:  Acute onset of nausea, fever and shortness of breath for 1 week. Initial encounter.  EXAM: CHEST  2 VIEW  COMPARISON:  Chest radiograph performed 02/19/2013  FINDINGS: The lungs are significantly hypoexpanded, with vascular crowding. There is no evidence of focal opacification, pleural effusion or pneumothorax. The lateral view is suboptimal due to lung hypoexpansion.  The heart is borderline normal in size; the mediastinal contour is within normal limits. No acute osseous abnormalities are seen.  IMPRESSION: Lungs significantly hypoexpanded but grossly clear.   Electronically Signed   By: Garald Balding M.D.   On: 12/13/2013 22:47   Dg Abd 1 View  12/13/2013   CLINICAL DATA:  Acute onset of fever, shortness of breath and nausea for 1 week. Subsequent encounter.  EXAM: ABDOMEN - 1 VIEW  COMPARISON:  Abdominal radiograph performed 12/12/2013  FINDINGS: The visualized bowel gas pattern is unremarkable. Scattered air filled loops of colon are seen; no abnormal dilatation of small bowel loops is seen to suggest small bowel obstruction. No free intra-abdominal air is identified, though evaluation for free air is limited on a single supine view.  The visualized osseous structures are within normal limits; the sacroiliac joints are unremarkable in appearance.  IMPRESSION: Unremarkable bowel gas pattern; no free intra-abdominal air seen.   Electronically Signed   By: Garald Balding M.D.   On: 12/13/2013 22:48   Mr Foot Right Wo Contrast  12/14/2013   CLINICAL DATA:  Right foot cellulitis and abscess.  EXAM: MRI OF THE RIGHT FOREFOOT WITHOUT  CONTRAST  TECHNIQUE: Multiplanar, multisequence MR imaging was performed. No intravenous contrast was administered.  COMPARISON:  12/10/2013  FINDINGS: Extensive subcutaneous edema tracking along the foot, especially the dorsum of the foot. Infiltrative subcutaneous edema tracks along the plantar musculature of the foot.  A 3.4 by 1.2 by 3.0 cm abscess is present tracking  from the subcutaneous tissues just superficial to the distal medial band of the plantar fascia (which is focally thickened) and tracking along an within the plantar musculature of the foot plantar to the space between the distal first and second metatarsals.  The prior abscess plantar to the fourth MTP joint appears as though it has drained to the scan. Overlying ulceration noted.  There is an effusion of the first MTP joint. Questionable increased signal distally in the toes is somewhat diffuse and probably related to field heterogeneity rather than osteomyelitis. Lisfranc joint alignment normal with Lisfranc ligament intact. Spurring along the Chopart joint and along the Lisfranc joint noted.  IMPRESSION: 1. Although the abscess below the fourth MTP joint has been drained or has spontaneously drained to the plantar ulcer surface, there is now an abscess tracking in the medial plantar subcutaneous tissues below the distal first and second metatarsals as described above. Extensive subcutaneous and muscular edema typical for cellulitis and myositis. There is thickening of the medial band of the plantar fascia focally adjacent to the abscess. No definite osteomyelitis.   Electronically Signed   By: Sherryl Barters M.D.   On: 12/14/2013 19:32   Medications: I have reviewed the patient's current medications. Scheduled Meds: . sodium chloride   Intravenous Once  . antiseptic oral rinse  7 mL Mouth Rinse q12n4p  . chlorhexidine  15 mL Mouth Rinse BID  . feeding supplement (GLUCERNA SHAKE)  237 mL Oral BID BM  . furosemide  20 mg Intravenous Once  . insulin aspart  0-15 Units Subcutaneous TID WC  . insulin aspart protamine- aspart  45 Units Subcutaneous BID WC  . piperacillin-tazobactam (ZOSYN)  IV  3.375 g Intravenous 3 times per day  . polyethylene glycol  17 g Oral Daily  . pravastatin  20 mg Oral q1800  . vancomycin  2,000 mg Intravenous Q24H  . Warfarin - Pharmacist Dosing Inpatient   Does not apply  q1800   Continuous Infusions: . sodium chloride 50 mL/hr at 12/15/13 0432   PRN Meds:.acetaminophen **OR** acetaminophen, dextrose, ondansetron, oxyCODONE-acetaminophen, promethazine, senna-docusate   Assessment/Plan:  Diabetic foot ulcer and abscess, right foot with sepsis: S/p Bedside I&D 12/11/2013.  Wound cx growing few methicillin resistant staph aureus.  Repeat MRI R foot on 12/14/2013 demonstrated abscess tracking in medial plantar subcutaneous tissues below the distal first and second metatarsals. Now S/p repeat I&D 12/14/2013. - continue IV Vancomycin and IV Zosyn - IV fluids 50 mL/hr  - Percocet 5/325mg  1-2 tab q4hr prn pain; I have asked him to let RN know if pain uncontrolled as we may need to add prn IV pain med  - follow-up repeat wound culture  Diarrhea, abdominal pain:   - C diff PCR sent  Acute on chronic anemia: Hgb 11 at admission now down to 6.7. MCV wnl. Dilutional vs acute blood loss from surgery.   - Haptoglobin  - Hepatic function panel  - Peripheral smear  - Vitamin B12, Folate   - Iron, TIBC, Ferritin  - Reticulocytes  - LDH  - Transfuse 2u pRBC  Suspected Diabetic ketoacidosis secondary to type 2 diabetes: Resolved.    - SSI mod,  CBG q AC/HS  - Novolog 70/30 45u BID  AKI on CKD stage 2: Cr down from 2.19 to 2.29 (baseline around 1.6)  - Continue IV fluids.  - Avoid renal toxins   Hypertension:   - Holding home lisinopril-hydrochlorothiazide and Lasix 40 mg given AKI.  Recurrent DVTs on chronic Coumadin therapy: - warfarin per pharm  FEN:  - carb modified diet  DVT ppx: SCDs, warfarin  Dispo: Disposition is deferred at this time, awaiting improvement of current medical problems.  Anticipated discharge in approximately 1-2 day(s).   The patient does have a current PCP Lucious Groves, DO) and does need an [facility] hospital follow-up appointment after discharge.  The patient does not have transportation limitations that hinder transportation to  clinic appointments.  .Services Needed at time of discharge: Y = Yes, Blank = No PT:   OT:   RN:   Equipment:   Other:     LOS: 6 days   Jacques Earthly, MD 12/15/2013, 11:57 AM

## 2013-12-15 NOTE — Brief Op Note (Signed)
12/09/2013 - 12/15/2013  1:25 AM  PATIENT:  [name]  38 y.o. male  PRE-OPERATIVE DIAGNOSIS:  infected right foot, abscess  POST-OPERATIVE DIAGNOSIS:  infected right foot, abscess  PROCEDURE:  Procedure(s): IRRIGATION AND DEBRIDEMENT RIGHT FOOT (Right)  SURGEON:  Surgeon(s) and Role:    * [name], MD - Primary  PHYSICIAN ASSISTANT:   ASSISTANTS: none   ANESTHESIA:   general  EBL:  Total I/O In: 250 [I.V.:250] Out: -   BLOOD ADMINISTERED:none  DRAINS: none   LOCAL MEDICATIONS USED:  NONE  SPECIMEN:  No Specimen and Source of Specimen:  medial plantar foot  DISPOSITION OF SPECIMEN:  micro  COUNTS:  YES  TOURNIQUET:  * No tourniquets in log *  DICTATION: .Other Dictation: Dictation Number [id number]  PLAN OF CARE: Admit to inpatient   PATIENT DISPOSITION:  PACU - hemodynamically stable.   Delay start of Pharmacological VTE agent (>24hrs) due to surgical blood loss or risk of bleeding: no

## 2013-12-15 NOTE — Progress Notes (Signed)
Subjective: 1 Day Post-Op Procedure(s) (LRB): IRRIGATION AND DEBRIDEMENT RIGHT FOOT (Right) Patient reports pain as mild.  Pt taken back to the OR early this morning by Dr. Veverly Fells for a new abscess of the right foot.  He says the foot feels better after surgery.    Objective: Vital signs in last 24 hours: Temp:  [98 F (36.7 C)-102.7 F (39.3 C)] 100.8 F (38.2 C) (12/07 1213) Pulse Rate:  [103-113] 110 (12/07 1213) Resp:  [18-22] 20 (12/07 1213) BP: (122-174)/(56-88) 165/75 mmHg (12/07 1213) SpO2:  [92 %-98 %] 96 % (12/07 1213) Weight:  [165.744 kg (365 lb 6.4 oz)] 165.744 kg (365 lb 6.4 oz) (12/07 0610)  Intake/Output from previous day: 12/06 0701 - 12/07 0700 In: 300 [I.V.:300] Out: 1211 [Urine:1201; Blood:10] Intake/Output this shift:     Recent Labs  12/13/13 0554 12/14/13 0738 12/14/13 1923 12/15/13 0802  HGB 8.4* 7.0* 7.3* 6.7*    Recent Labs  12/14/13 1923 12/15/13 0802  WBC 27.1* 26.0*  RBC 2.61* 2.48*  HCT 22.9* 21.7*  PLT 483* 498*    Recent Labs  12/14/13 0738 12/15/13 0802  NA 140 135*  K 4.4 4.9  CL 106 104  CO2 18* 17*  BUN 55* 53*  CREATININE 2.29* 2.19*  GLUCOSE 150* 174*  CALCIUM 8.8 8.7    Recent Labs  12/14/13 0738 12/15/13 0802  INR 5.44* 6.23*    PE:  R foot dressed and dry.  Brisk cap refill at toes.  No lymphadenoapthy.  + swellinga t tfoot.  Assessment/Plan: 1 Day Post-Op Procedure(s) (LRB): IRRIGATION AND DEBRIDEMENT RIGHT FOOT (Right) Continue IV abx.  WBAT on R LE in post op shoe.We'll plan for a dressing change tomorrow.  [name] 12/15/2013, 1:02 PM

## 2013-12-15 NOTE — Progress Notes (Signed)
Administered patient blood transfusion PRBCs about 11:48 am. Patient temp before administration was 98.6. Stayed with patient for first 15 min and Temp reading was 100.8 afterwards. Patient denied pain, shortness of breath, rash, or any other symptoms. Patient did complain of some chills. Transfusion was stopped immediately and MD notified. Dr. Redmond Pulling returned call. Orders were implemented and blood bank was notified. Labs were drawn. Blood was taken back to lab. Blood product will be held until further notice. Patient stable.

## 2013-12-15 NOTE — Progress Notes (Signed)
Interim Progress Note  Paged by RN around 12:30PM regarding patient with T 100.25F 15 minutes after starting transfusion of PRBCs.  The transfusion was stopped at that time.  The patient denied symptoms of dyspnea, itching, back or flank pain.  There was no evidence of angioedema, hypotension, rash or bleeding.  I confirmed with RN that transfusion had been stopped.  She also confirmed that the patient was having no other symptoms and appeared just as he had prior to beginning transfusion.  RN was asked to call blood bank and confirm the correct PRBCs were given.  I also called and spoke with blood bank who confirmed the correct PRBCs were given.  Blood bank agreed to send acute transfusion hemolytic reaction labs.  RN agreed to walk PRBCs back down to blood bank.  Went to see patient this afternoon.  He again denies dyspnea, itching, back/flank pain, rash or bleeding during the transfusion or at this time.    Vitals after transfusion stopped:  T 100.25F, RR 20, HR 110, BP 165/75, SpO2 96% on RA Exam this afternoon:   General:  In bed in NAD HEENT:  [place]/AT, no facial edema noted Heart:  Tachycardic, +S1+S2, RR, no murmurs/rubs/gallops Lung:  CTA B/L, no wheezes/rales/rhonchi/stridor, no accessory muscle use, no respiratory distress Abdomen:  + BS all four quadrants, soft, minimal diffuse tenderness to palpation, no rebound or guarding LE:  + 2 B/L LE edema; right foot dressing c/d/i Neuro:  AAO x 4, responding appropriately, able to move independently, following commands  38 yo male with PMH of poorly controlled type 1 DM, hx of VTE on coumadin and DM foot ulcer admitted with right foot cellulitis and found to have an abscess on MRI of the foot w/o evidence of osteomyelitis.  He has been treated with vanc/zosyn and bedside I&D by ortho.  Initial wound cultures positive for MRSA.  He continued to spike fevers and had an increasing leukocytosis despite broad spectrum antibiotics.  Repeat MRI shows a new  abscess in the same foot still w/o evidence of osteo.  The patient is POD#1 after I&D in the OR.  Hgb trended down to 6.7 this AM so we attempted to transfuse 2 units PRBCs and patient had subsequent fever.  Since he had no respiratory distress or hypotension TRALI or TACO are unlikely.  He had no hypotension, flank/back pain or bleeding to suggest acute hemolytic transfusion reaction.  No hypotension, angioedema or itching to suggest anaphylaxis.  Blood cx negative to date and no hypotension making sepsis less likely.  His symptoms of fever without other symptoms makes febrile non-hemolytic reaction, post-op fever or fever due to current infection more likely.   - blood bank to send transfusion reaction labs - DAT ([place]) negative; Pathologist interpretation is probable febrile reaction.  No evidence of hemolytic reaction.  - UA for hemoglobin - small Hgb - order 2 units of PRBCs for transfusion - will given Tylenol prior to transfusion - close monitoring during PRBC transfusion - post transfusion CBC  - Lasix 20mg  IV after transfusion - follow-up anemia labs - continue vanc/zosyn with plans to narrow as culture results return, leukocytosis starts to downtrend and once patient afebrile for 24 hours - night team to re-assess the patient this evening

## 2013-12-15 NOTE — Progress Notes (Signed)
NUTRITION FOLLOW UP  Intervention:   -D/c Prostat due to poor acceptance -Glucerna Shake po BID, each supplement provides 220 kcal and 10 grams of protein  Nutrition Dx:   Increased nutrient needs related to wound healing as evidenced by diabetic ulcer on foot; ongoing  Goal:   Pt to meet >/= 90% of their estimated nutrition needs   Monitor:   Weight trend, po intake, acceptance of supplements, labs  Assessment:   38 year old african [place] gentleman with type 2 diabetes on insulin, obesity, recurrent DVT on chronic coumadin presenting with a chronic (1 year) non-healing right foot plantar ulcer.   S/p I&D of rt diabetic foot abscess. S/p surgical debridement on 12/15/13.  Intake has been poor due to nausea and vomiting. He has been refusing Prostat supplement. Noted wt increase, likely due to fluid retention. Pt is currently on IV lasix.  Pt has no further questions about diabetes management.  Labs reviewed. Na: 135, CO2: 17, BUN/Creat: 53/2.19, Glucose: 174, CBGS: 143-179.   Height: Ht Readings from Last 1 Encounters:  12/10/13 6\' 2"  (1.88 m)    Weight Status:   Wt Readings from Last 1 Encounters:  12/15/13 365 lb 6.4 oz (165.744 kg)   12/10/13 323 lb 4.8 oz (146.648 kg)   Re-estimated needs:  Kcal: 2400-2600 Protein: 128-138 grams Fluid: 2.4-2.6 L  Skin: diabetic ulcer on rt foot, rt foot surgical incision  Diet Order: Diet Carb Modified   Intake/Output Summary (Last 24 hours) at 12/15/13 1140 Last data filed at 12/15/13 0129  Gross per 24 hour  Intake    300 ml  Output   1211 ml  Net   -911 ml    Last BM: 12/13/13   Labs:   Recent Labs Lab 12/13/13 2037 12/14/13 0738 12/15/13 0802  NA 133* 140 135*  K 4.8 4.4 4.9  CL 98 106 104  CO2 19 18* 17*  BUN 53* 55* 53*  CREATININE 2.06* 2.29* 2.19*  CALCIUM 9.0 8.8 8.7  GLUCOSE 309* 150* 174*    CBG (last 3)   Recent Labs  12/15/13 0127 12/15/13 0801 12/15/13 1033  GLUCAP 143* 175* 179*    Lab Results  Component Value Date   HGBA1C 12.8 08/14/2013   Scheduled Meds: . sodium chloride   Intravenous Once  . antiseptic oral rinse  7 mL Mouth Rinse q12n4p  . chlorhexidine  15 mL Mouth Rinse BID  . feeding supplement (PRO-STAT SUGAR FREE 64)  30 mL Oral TID WC  . furosemide  20 mg Intravenous Once  . insulin aspart  0-15 Units Subcutaneous TID WC  . insulin aspart protamine- aspart  45 Units Subcutaneous BID WC  . piperacillin-tazobactam (ZOSYN)  IV  3.375 g Intravenous 3 times per day  . polyethylene glycol  17 g Oral Daily  . pravastatin  20 mg Oral q1800  . vancomycin  2,000 mg Intravenous Q24H  . Warfarin - Pharmacist Dosing Inpatient   Does not apply q1800    Continuous Infusions: . sodium chloride 50 mL/hr at 12/15/13 0432    Lealer Marsland A. Jimmye Norman, RD, LDN Pager: [phone] After hours Pager: [phone]

## 2013-12-15 NOTE — Anesthesia Preprocedure Evaluation (Addendum)
Anesthesia Evaluation  Patient identified by MRN, date of birth, ID band Patient awake    Reviewed: Allergy & Precautions, H&P , NPO status , Patient's Chart, lab work & pertinent test results  History of Anesthesia Complications (+) history of anesthetic complications  Airway Mallampati: III   Neck ROM: Full    Dental   Pulmonary    + decreased breath sounds      Cardiovascular hypertension, + Peripheral Vascular Disease, + DOE and DVT Rhythm:Regular Rate:Tachycardia     Neuro/Psych    GI/Hepatic GERD-  ,  Endo/Other  diabetes, Poorly Controlled, Insulin DependentMorbid obesityHx of noncompliance c meds  Renal/GU Renal InsufficiencyRenal disease     Musculoskeletal   Abdominal (+) + obese,   Peds  Hematology  (+) anemia ,   Anesthesia Other Findings   Reproductive/Obstetrics                           Anesthesia Physical Anesthesia Plan  ASA: III  Anesthesia Plan:    Post-op Pain Management:    Induction: Intravenous  Airway Management Planned: Oral ETT  Additional Equipment:   Intra-op Plan:   Post-operative Plan: Extubation in OR  Informed Consent: I have reviewed the patients History and Physical, chart, labs and discussed the procedure including the risks, benefits and alternatives for the proposed anesthesia with the patient or authorized representative who has indicated his/her understanding and acceptance.   Dental advisory given  Plan Discussed with:   Anesthesia Plan Comments:         Anesthesia Quick Evaluation

## 2013-12-15 NOTE — Progress Notes (Signed)
CRITICAL VALUE ALERT  Critical value received:  INR 6.23  Date of notification:  12/15/2013  Time of notification:  0902  Critical value read back:Yes.    Nurse who received alert:  Baxter Flattery  MD notified (1st page):  Dr. Randell Patient   Time of first page:  0927  MD notified (2nd page):  Time of second page:  Responding MD:  Dr. Randell Patient  Time MD responded:  [phone]

## 2013-12-15 NOTE — Interval H&P Note (Signed)
History and Physical Interval Note:  12/15/2013 12:26 AM  [name]  has presented today for surgery, with the diagnosis of infected right foot  The various methods of treatment have been discussed with the patient and family. After consideration of risks, benefits and other options for treatment, the patient has consented to  Procedure(s): IRRIGATION AND DEBRIDEMENT RIGHT FOOT (Right) as a surgical intervention .  The patient's history has been reviewed, patient examined, no change in status, stable for surgery.  I have reviewed the patient's chart and labs.  Questions were answered to the patient's satisfaction.     Neena Beecham,STEVEN R

## 2013-12-15 NOTE — Transfer of Care (Signed)
Immediate Anesthesia Transfer of Care Note  Patient: [name]  Procedure(s) Performed: Procedure(s): IRRIGATION AND DEBRIDEMENT RIGHT FOOT (Right)  Patient Location: PACU  Anesthesia Type:General  Level of Consciousness: awake, alert  and oriented  Airway & Oxygen Therapy: Patient Spontanous Breathing and Patient connected to nasal cannula oxygen  Post-op Assessment: Report given to PACU RN and Post -op Vital signs reviewed and stable  Post vital signs: Reviewed and stable  Complications: No apparent anesthesia complications

## 2013-12-15 NOTE — Op Note (Signed)
NAME:  [name], [name]           ACCOUNT NO.:  [id number]  MEDICAL RECORD NO.:  [id number]  LOCATION:  6N20C                        FACILITY:  [facility]  PHYSICIAN:  Doran Heater. Veverly Fells, M.D. DATE OF BIRTH:  May 31, 1975  DATE OF PROCEDURE:  12/15/2013 DATE OF DISCHARGE:                              OPERATIVE REPORT   PREOPERATIVE DIAGNOSIS:  Right foot abscess.  POSTOPERATIVE DIAGNOSIS:  Right foot abscess.  PROCEDURE PERFORMED:  Incision and drainage and operative cultures from right foot abscess.  ATTENDING SURGEON:  Doran Heater. Veverly Fells, MD.  ASSISTANT:  None  ANESTHESIA:  LMA general anesthesia was used.  ESTIMATED BLOOD LOSS:  Minimal.  FLUID REPLACEMENT:  1000 mL crystalloid.  COUNTS:  Instrument count was correct.  COMPLICATIONS:  There were no complications.  ANTIBIOTICS:  Perioperative antibiotics were given.  INDICATIONS:  The patient is a 38 year old male with diabetes poorly controlled. who has had a prior bedside debridement of a right lateral plantar ulcer that has been packed.  He has been on antibiotics. Unfortunately his white count has been escalating despite no active purulent drainage from his lateral ulcer.  Repeat MRI scan today demonstrates an abscess on the opposite side of the foot, mid foot plantar to the 1st and 2nd metatarsals around the medial plantar fascia. Because of the patient's elevated white count, his failure to respond to IV antibiotics, and his new abscess, he was counseled for an operative incision and drainage of his abscess with reculturing that and then packing. The patient agreed to this.  Informed consent obtained.  DESCRIPTION OF PROCEDURE:  After an adequate level of anesthesia achieved, the patient was positioned supine on the operating table.  The right leg was correctly identified and sterilely prepped and draped and a time-out was called.  A longitudinal skin incision was created just plantar to the medial and just medial to  the 1st metatarsal, proximal to the metatarsal phalangeal joint, and also just plantar to the metatarsal itself. This incision was created with a 10- blade scalpel.  Dissection carried out down through subcutaneous tissues bluntly using scissors. Bovie electrocautery was used to control hemostasis.  Upon entering the plane between the subcutaneous fat and the muscular plane in the foot, a cloudy fluid was released. We cultured that with aerobic anaerobic Gram stain.  We then did a thorough pulse irrigation and a finger sweep the entire area and irrigated with 3 L of normal saline irrigation.  The irrigation fluid was clear.  We felt like the abscess had been adequately decompressed; felt around for any other soft areas, none were encountered; placed moistened 4 x 4, gauze. sponge into the dead space; packing that wound; and then a sterile bandage placed on top of that.  The patient tolerated procedure well.     Doran Heater. Veverly Fells, M.D.     SRN/MEDQ  D:  12/15/2013  T:  12/15/2013  Job:  [id number]

## 2013-12-15 NOTE — H&P (View-Only) (Signed)
Orthopedics Progress Note  Subjective: Patient still complains of right foot pain  Objective:  Filed Vitals:   12/14/13 0529  BP: 150/65  Pulse: 113  Temp: 101.4 F (38.6 C)  Resp: 17    General: Awake and alert  Musculoskeletal: right foot and ankle swollen and tender, 1 cm ulcer (hole) on the plantar aspect of the foot that had iodoform packing in place. Removed the packing and only a small amount of blood came out. No foul smell, no purulent drainage noted. Repacked with iodoform gauze to control the dead space.  Neurovascularly intact  Lab Results  Component Value Date   WBC 26.3* 12/14/2013   HGB 7.0* 12/14/2013   HCT 22.1* 12/14/2013   MCV 88.4 12/14/2013   PLT 492* 12/14/2013       Component Value Date/Time   NA 140 12/14/2013 0738   K 4.4 12/14/2013 0738   CL 106 12/14/2013 0738   CO2 18* 12/14/2013 0738   GLUCOSE 150* 12/14/2013 0738   BUN 55* 12/14/2013 0738   CREATININE 2.29* 12/14/2013 0738   CREATININE 1.78* 12/09/2013 1559   CALCIUM 8.8 12/14/2013 0738   GFRNONAA 35* 12/14/2013 0738   GFRNONAA 48* 12/09/2013 1559   GFRAA 40* 12/14/2013 0738   GFRAA 55* 12/09/2013 1559    Lab Results  Component Value Date   INR 5.44* 12/14/2013   INR 2.84* 12/13/2013   INR 2.09* 12/12/2013    Assessment/Plan:  s/p bedside I+D of the right foot for a diabetic abscess on the plantar aspect of the foot with elevation in WBC which is concerning considering that the abscess has bee decompressed surgically. Consider repeating MRI scan of the right foot to look for any ongoing abscess in the foot   Doran Heater. Veverly Fells, MD 12/14/2013 12:04 PM

## 2013-12-15 NOTE — Progress Notes (Signed)
ANTICOAGULATION/ANTIBIOTIC CONSULT NOTE - follow up Pharmacy Consult for Warfarin, Vancomycin, and Zosyn Indication: recurrent DVTs, MRSA R heel cellulitis  No Known Allergies  Patient Measurements: Height: 6\' 2"  (188 cm) Weight: (!) 365 lb 6.4 oz (165.744 kg) IBW/kg (Calculated) : 82.2  Vital Signs: Temp: 100.8 F (38.2 C) (12/07 1203) Temp Source: Oral (12/07 1203) BP: 165/75 mmHg (12/07 1203) Pulse Rate: 110 (12/07 1203)  Labs:  Recent Labs  12/13/13 0554 12/13/13 2037 12/14/13 0738 12/14/13 1923 12/15/13 0802  HGB 8.4*  --  7.0* 7.3* 6.7*  HCT 26.7*  --  22.1* 22.9* 21.7*  PLT 489*  --  492* 483* 498*  LABPROT 30.0*  --  50.0*  --  55.5*  INR 2.84*  --  5.44*  --  6.23*  CREATININE 1.82* 2.06* 2.29*  --  2.19*    Estimated Creatinine Clearance: 75.5 mL/min (by C-G formula based on Cr of 2.19).     Assessment: 38yom admitted for cellulitis.    AC: Has h/o recurrent DVT. He is supposed to be on warfarin as outpt but is non-compliant. INR 1.28 on admission. Coumadin held 12/3 for I&D. INR up to 6.23 today. Hgb down to 6.7, plt high.  Home dose: Per outpatient note last home regimen warfarin 7.5mg  daily and 10mg  Mon and Thur.   Infectious Disease: Vanc/Zosyn for MRSA R heel cellulitis. WBC 26 - stable but elevated. Tmax 102.7. No osteo on foot xray. MR R foot compatible with abscess- s/p I&D by ortho 12/3 and I&D 12/7.   ID was curbsided and felt that he likely needed further debridement of the foot and recommended calling Orthopedics in the morning. ID also recommended narrowing antibiotics to just Vancomycin +/- Ancef. The primary team spoke to Dr. Tommy Medal with ID and given his high fever decided to continue both the Vanc and Zosyn 12/6 p.m.   Vanc 12/1>> Zosyn 12/1>>  12/4 Vanc trough 24.3 mcg/ml (supratherapeutic) on vanc 1.5 gm q12h  12/4 abscess cx>>few staph aureus MRSA 12/4 fluid cx>> 12/4 bc x2>>ngtd 12/5 BCx2>ngtd 12/6 UCx>neg   Goal of Therapy:   INR 2-3; Vancomycin trough 15-20 mcg/ml  Plan:  - Vancomycin 2 gm IV q24 - Will check vancomycin trough in a.m. - Zosyn 3.375 gm IV q8h, infuse each dose over 4 hours - No coumadin today - Daily INR  Sherlon Handing, PharmD, BCPS Clinical pharmacist, pager [phone] 12/15/2013 2:16 PM

## 2013-12-15 NOTE — Anesthesia Procedure Notes (Signed)
Procedure Name: Intubation Date/Time: 12/15/2013 12:47 AM Performed by: Mosie Epstein Pre-anesthesia Checklist: Patient identified, Timeout performed, Emergency Drugs available, Suction available and Patient being monitored Patient Re-evaluated:Patient Re-evaluated prior to inductionOxygen Delivery Method: Circle system utilized Preoxygenation: Pre-oxygenation with 100% oxygen Intubation Type: IV induction and Rapid sequence Ventilation: Mask ventilation without difficulty Laryngoscope size: planned glidescope. Tube type: Oral Tube size: 8.0 mm Number of attempts: 1 Airway Equipment and Method: Video-laryngoscopy Placement Confirmation: ETT inserted through vocal cords under direct vision,  positive ETCO2 and breath sounds checked- equal and bilateral Secured at: 23 cm Tube secured with: Tape Dental Injury: Teeth and Oropharynx as per pre-operative assessment  Comments: Planned glidescope

## 2013-12-15 NOTE — Clinical Documentation Improvement (Signed)
  Problem list gives diagnosis "Sepsis" dated 12/12/13. This diagnosis is not documented in Progress Notes. Patient had temp spike to 102, HR 122, increasing WBC to 27.1 despite IV Vanc and Zosyn with infection source DM abscess plantar surface foot requiring I&D. Please clarify documentation to illustrate severity of illness and risk of  Mortality.  Thank you,  Ezekiel Ina ,RN Clinical Documentation Specialist:  [phone]  [facility] [employer] [facility]

## 2013-12-16 ENCOUNTER — Inpatient Hospital Stay (HOSPITAL_COMMUNITY): Payer: Medicaid Other

## 2013-12-16 DIAGNOSIS — R110 Nausea: Secondary | ICD-10-CM

## 2013-12-16 LAB — CBC WITH DIFFERENTIAL/PLATELET
BASOS PCT: 0 % (ref 0–1)
Basophils Absolute: 0 10*3/uL (ref 0.0–0.1)
EOS ABS: 0.3 10*3/uL (ref 0.0–0.7)
Eosinophils Relative: 1 % (ref 0–5)
HEMATOCRIT: 25.2 % — AB (ref 39.0–52.0)
Hemoglobin: 8.1 g/dL — ABNORMAL LOW (ref 13.0–17.0)
LYMPHS ABS: 2.2 10*3/uL (ref 0.7–4.0)
Lymphocytes Relative: 8 % — ABNORMAL LOW (ref 12–46)
MCH: 27.8 pg (ref 26.0–34.0)
MCHC: 32.1 g/dL (ref 30.0–36.0)
MCV: 86.6 fL (ref 78.0–100.0)
MONO ABS: 2.5 10*3/uL — AB (ref 0.1–1.0)
Monocytes Relative: 9 % (ref 3–12)
Neutro Abs: 23.1 10*3/uL — ABNORMAL HIGH (ref 1.7–7.7)
Neutrophils Relative %: 82 % — ABNORMAL HIGH (ref 43–77)
Platelets: 543 10*3/uL — ABNORMAL HIGH (ref 150–400)
RBC: 2.91 MIL/uL — ABNORMAL LOW (ref 4.22–5.81)
RDW: 16.7 % — AB (ref 11.5–15.5)
WBC: 28.1 10*3/uL — ABNORMAL HIGH (ref 4.0–10.5)

## 2013-12-16 LAB — TYPE AND SCREEN
ABO/RH(D): B POS
Antibody Screen: NEGATIVE
UNIT DIVISION: 0
UNIT DIVISION: 0
Unit division: 0

## 2013-12-16 LAB — GLUCOSE, CAPILLARY
GLUCOSE-CAPILLARY: 76 mg/dL (ref 70–99)
Glucose-Capillary: 177 mg/dL — ABNORMAL HIGH (ref 70–99)
Glucose-Capillary: 188 mg/dL — ABNORMAL HIGH (ref 70–99)
Glucose-Capillary: 146 mg/dL — ABNORMAL HIGH (ref 70–99)

## 2013-12-16 LAB — BASIC METABOLIC PANEL
Anion gap: 17 — ABNORMAL HIGH (ref 5–15)
BUN: 50 mg/dL — AB (ref 6–23)
CALCIUM: 8.8 mg/dL (ref 8.4–10.5)
CO2: 18 meq/L — AB (ref 19–32)
Chloride: 106 mEq/L (ref 96–112)
Creatinine, Ser: 2.16 mg/dL — ABNORMAL HIGH (ref 0.50–1.35)
GFR calc Af Amer: 43 mL/min — ABNORMAL LOW (ref >90)
GFR calc non Af Amer: 37 mL/min — ABNORMAL LOW (ref >90)
GLUCOSE: 85 mg/dL (ref 70–99)
Potassium: 4.6 mEq/L (ref 3.7–5.3)
SODIUM: 141 meq/L (ref 137–147)

## 2013-12-16 LAB — PROTIME-INR
INR: 3.78 — ABNORMAL HIGH (ref 0.00–1.49)
Prothrombin Time: 37.6 seconds — ABNORMAL HIGH (ref 11.6–15.2)

## 2013-12-16 LAB — VANCOMYCIN, TROUGH: VANCOMYCIN TR: 19.4 ug/mL (ref 10.0–20.0)

## 2013-12-16 MED ORDER — WARFARIN SODIUM 2 MG PO TABS
2.0000 mg | ORAL_TABLET | Freq: Once | ORAL | Status: AC
  Administered 2013-12-16: 2 mg via ORAL
  Filled 2013-12-16: qty 1

## 2013-12-16 NOTE — Evaluation (Signed)
Physical Therapy Evaluation Patient Details Name: [name] MRN: [id number] DOB: 02/06/1975 Today's Date: 12/16/2013   History of Present Illness  38 year old african [place] gentleman with type 2 diabetes on insulin, obesity, recurrent DVT on chronic coumadin presenting with a chronic (1 year) non-healing right foot plantar ulcer - non-necrotic, circular, with irregular margins, shallow, tender and warm. The patient has good pedal pulses bilaterally. I examined the patient personally and agree with Dr Trudee Kuster findings. The patients reports systemic symptoms of fever and chills, nausea and vomiting. The patient reports non-compliance with meds including insulin.   Clinical Impression  Pt presents with decreased balance, mobility, and increased pain.  Pt tolerated short distance ambulation in hallway with bariatric RW, however demonstrates unsafe behaviors requiring up to mod A and max verbal cues for safety.  Pt initially wanting to trial crutches, however due to safety concerns, feel RW is safer option.  Pt will need to be able to ascend/descend 4 STE w/o rail to enter girlfriends home.  PT recommends HHPT for follow up at D/C.     Follow Up Recommendations Home health PT;Supervision for mobility/OOB    Equipment Recommendations  Rolling walker with 5" wheels;3in1 (PT) (bari RW and bari 3in1)    Recommendations for Other Services OT consult     Precautions / Restrictions Precautions Precautions: Fall Precaution Comments: R foot I&D, obese Restrictions Weight Bearing Restrictions: No      Mobility  Bed Mobility Overal bed mobility: Modified Independent             General bed mobility comments: Pt able to get to EOB with HOB flat and without rails during PT session, but requires increased time  Transfers Overall transfer level: Needs assistance Equipment used: Rolling walker (2 wheeled) Transfers: Sit to/from Stand Sit to Stand: Min assist         General  transfer comment: Assist to elevate into standing and steady once in standing with mod cues for hand placement and safety   Ambulation/Gait Ambulation/Gait assistance: Min assist;Mod assist Ambulation Distance (Feet): 40 Feet Assistive device: Rolling walker (2 wheeled) Gait Pattern/deviations: Step-to pattern;Step-through pattern;Decreased stance time - right;Decreased stride length;Antalgic;Wide base of support Gait velocity: decreased   General Gait Details: Pt requires max verbal cues at times for safety, as he tends to let RW get too far ahead of him and demonstrates impulsivity when getting back into room to recliner.  Poor safety awareness when attempting to negotiate around obstacles in room.   Stairs            Wheelchair Mobility    Modified Rankin (Stroke Patients Only)       Balance Overall balance assessment: Needs assistance Sitting-balance support: Feet supported;No upper extremity supported Sitting balance-[name]: Normal     Standing balance support: During functional activity;Bilateral upper extremity supported Standing balance-[name]: Fair Standing balance comment: Pt able to perform static standind with RW at close S level.                              Pertinent Vitals/Pain Pain Assessment: 0-10 Pain Score: 6  Pain Location: R foot Pain Descriptors / Indicators: Aching Pain Intervention(s): Premedicated before session;Repositioned    Home Living Family/patient expects to be discharged to:: Private residence Living Arrangements: Spouse/significant other;Parent Available Help at Discharge: Family (either gf or mother) Type of Home: House (townhome at mothers) Home Access: Stairs to enter Entrance Stairs-Rails: Right;None Entrance  Stairs-Number of Steps: 4 at gf's  Home Layout: Two level (at mothers) Home Equipment: None      Prior Function Level of Independence: Independent               Hand Dominance         Extremity/Trunk Assessment               Lower Extremity Assessment: Overall WFL for tasks assessed      Cervical / Trunk Assessment: Normal  Communication   Communication: No difficulties  Cognition Arousal/Alertness: Awake/alert Behavior During Therapy: WFL for tasks assessed/performed Overall Cognitive Status: Within Functional Limits for tasks assessed                      General Comments      Exercises        Assessment/Plan    PT Assessment Patient needs continued PT services  PT Diagnosis Difficulty walking;Acute pain   PT Problem List Decreased activity tolerance;Decreased balance;Decreased mobility;Decreased knowledge of use of DME;Decreased safety awareness;Decreased knowledge of precautions;Obesity;Pain  PT Treatment Interventions DME instruction;Gait training;Stair training;Functional mobility training;Therapeutic activities;Therapeutic exercise;Balance training;Patient/family education   PT Goals (Current goals can be found in the Care Plan section) Acute Rehab PT Goals Patient Stated Goal: to return home PT Goal Formulation: With patient/family Time For Goal Achievement: 12/23/13 Potential to Achieve Goals: Good    Frequency Min 3X/week   Barriers to discharge Inaccessible home environment      Co-evaluation               End of Session   Activity Tolerance: Patient limited by pain Patient left: in chair;with call bell/phone within reach;with family/visitor present           Time: [id number] PT Time Calculation (min) (ACUTE ONLY): 27 min   Charges:   PT Evaluation $Initial PT Evaluation Tier I: 1 Procedure PT Treatments $Gait Training: 8-22 mins $Therapeutic Activity: 8-22 mins   PT G Codes:          Denice Bors 12/16/2013, 4:01 PM

## 2013-12-16 NOTE — Progress Notes (Addendum)
ANTICOAGULATION/ANTIBIOTIC CONSULT NOTE  Pharmacy Consult for Warfarin, Vancomycin, and Zosyn Indication: recurrent DVTs, MRSA R heel cellulitis  No Known Allergies  Patient Measurements: Height: 6\' 2"  (188 cm) Weight: (!) 365 lb 6.4 oz (165.744 kg) IBW/kg (Calculated) : 82.2  Vital Signs: Temp: 98.6 F (37 C) (12/08 0718) Temp Source: Oral (12/08 0718) BP: 174/72 mmHg (12/08 0718) Pulse Rate: 114 (12/08 0718)  Labs:  Recent Labs  12/14/13 0738 12/14/13 1923 12/15/13 0802 12/16/13 0620  HGB 7.0* 7.3* 6.7* 8.1*  HCT 22.1* 22.9* 21.7* 25.2*  PLT 492* 483* 498* 543*  LABPROT 50.0*  --  55.5* 37.6*  INR 5.44*  --  6.23* 3.78*  CREATININE 2.29*  --  2.19* 2.16*    Estimated Creatinine Clearance: 76.6 mL/min (by C-G formula based on Cr of 2.16).  Assessment: 38yom admitted for cellulitis.    AC: Has h/o recurrent DVTs. He is supposed to be on warfarin as outpt but is non-compliant. INR 1.28 on admission. INR trending down to 3.78 today (peaked 12/7 at 6.23) - since such a large decrease over past 24 hours, will give low dose today. Hgb 8.1 (was supposed to receive PRBC x 1 12/7 but after 15 min, pt experienced temp and some chills so PRBC taken down). Plt high.  Home dose: Per outpatient note last home regimen warfarin 7.5mg  daily and 10mg  Mon and Thur.   Infectious Disease: Vanc/Zosyn (Day #9) for MRSA R heel cellulitis. WBC 28.1 - trending up. Tmax 100.8. No osteo on foot xray. MR R foot compatible with abscess- s/p I&D 12/3 and 12/7. ID also recommended narrowing antibiotics to just Vancomycin +/- Ancef. The primary team spoke to Dr. Tommy Medal on 12/6 p.m. and given his high fever decided to continue both the Vanc and Zosyn.  Vanc 12/1>> Zosyn 12/1>>  12/4 Vanc trough 24.3 mcg/ml (supratherapeutic) on Vanc 1.5 gm q12h 12/8 Vanc trough 19.4 mcg/ml (therapeutic) on Vanc 2gm IV q24h  12/4 abscess cx>>few staph aureus MRSA 12/4 BCx2>>ngtd 12/5 BCx2>ngtd 12/6 UCx>neg   12/7 abscess cx>>  Goal of Therapy:  INR 2-3; Vancomycin trough 15-20 mcg/ml  Plan:  - Continue Vancomycin 2 gm IV q24 - Continue Zosyn 3.375 gm IV q8h, infuse each dose over 4 hours - Coumadin 2mg  tonight - Daily INR  Sherlon Handing, PharmD, BCPS Clinical pharmacist, pager [phone] 12/16/2013 11:17 AM

## 2013-12-16 NOTE — Progress Notes (Signed)
Subjective: Reports he is having worse abdominal pain this morning. Also having nausea and vomiting. Reports fevers and chills. He had a BM this morning with diarrhea.  Objective: Vital signs in last 24 hours: Filed Vitals:   12/15/13 2332 12/16/13 0010 12/16/13 0225 12/16/13 0718  BP: 158/82 149/80 164/96 174/72  Pulse: 102 99 108 114  Temp: 99.1 F (37.3 C) 99.1 F (37.3 C) 99.2 F (37.3 C) 98.6 F (37 C)  TempSrc: Oral Oral Oral Oral  Resp: 20 20 20 20   Height:      Weight:      SpO2: 100% 99% 96% 98%   Weight change:   Intake/Output Summary (Last 24 hours) at 12/16/13 1120 Last data filed at 12/16/13 0400  Gross per 24 hour  Intake    920 ml  Output      0 ml  Net    920 ml   General: NAD HEENT: [place]/AT Cardiac: RRR, no rubs, murmurs or gallops Pulm: clear to auscultation bilaterally, moving normal volumes of air Abd: soft, + BS, nondistended, mild diffuse tenderness most in LUQ Ext: +1 B/L LE edema, Dressing in place on R foot, minimally TTP Neuro: alert and oriented X3, responding appropriately, follows commands, able to sit up for lung exam  Lab Results: Basic Metabolic Panel:  Recent Labs Lab 12/15/13 0802 12/16/13 0620  NA 135* 141  K 4.9 4.6  CL 104 106  CO2 17* 18*  GLUCOSE 174* 85  BUN 53* 50*  CREATININE 2.19* 2.16*  CALCIUM 8.7 8.8   Liver Function Tests:  Recent Labs Lab 12/14/13 0738 12/15/13 1322  AST 23 24  ALT 23 26  ALKPHOS 224* 261*  BILITOT 1.6* 1.2  PROT 7.2 7.9  ALBUMIN 1.9* 1.9*   CBC:  Recent Labs Lab 12/15/13 0802 12/16/13 0620  WBC 26.0* 28.1*  NEUTROABS 20.3* 23.1*  HGB 6.7* 8.1*  HCT 21.7* 25.2*  MCV 87.5 86.6  PLT 498* 543*   CBG:  Recent Labs Lab 12/15/13 0801 12/15/13 1033 12/15/13 1218 12/15/13 1646 12/15/13 2157 12/16/13 0808  GLUCAP 175* 179* 169* 142* 109* 76   Coagulation:  Recent Labs Lab 12/13/13 0554 12/14/13 0738 12/15/13 0802 12/16/13 0620  LABPROT 30.0* 50.0* 55.5* 37.6*    INR 2.84* 5.44* 6.23* 3.78*   Urine Drug Screen: Drugs of Abuse     Component Value Date/Time   LABOPIA POSITIVE* 07/12/2011 0038   COCAINSCRNUR NONE DETECTED 07/12/2011 0038   LABBENZ NONE DETECTED 07/12/2011 0038   AMPHETMU NONE DETECTED 07/12/2011 0038   THCU NONE DETECTED 07/12/2011 0038   LABBARB NONE DETECTED 07/12/2011 0038    Micro Results: Recent Results (from the past 240 hour(s))  Culture, Blood     Status: None   Collection Time: 12/09/13  4:28 PM  Result Value Ref Range Status   Organism ID, Bacteria NO GROWTH 5 DAYS  Final  Culture, Blood     Status: None   Collection Time: 12/09/13  4:44 PM  Result Value Ref Range Status   Organism ID, Bacteria NO GROWTH 5 DAYS  Final  Anaerobic culture     Status: None (Preliminary result)   Collection Time: 12/12/13  2:29 AM  Result Value Ref Range Status   Specimen Description ABSCESS RIGHT FOOT  Final   Special Requests NONE  Final   Gram Stain   Final    ABUNDANT WBC PRESENT,BOTH PMN AND MONONUCLEAR NO SQUAMOUS EPITHELIAL CELLS SEEN RARE GRAM POSITIVE COCCI IN PAIRS IN CLUSTERS Performed  at [facility]    Culture   Final    NO ANAEROBES ISOLATED; CULTURE IN PROGRESS FOR 5 DAYS Performed at [facility]    Report Status PENDING  Incomplete  Culture, routine-abscess     Status: None   Collection Time: 12/12/13  2:29 AM  Result Value Ref Range Status   Specimen Description ABSCESS RIGHT FOOT  Final   Special Requests NONE  Final   Gram Stain   Final    ABUNDANT WBC PRESENT,BOTH PMN AND MONONUCLEAR NO SQUAMOUS EPITHELIAL CELLS SEEN RARE GRAM POSITIVE COCCI IN PAIRS IN CLUSTERS Performed at [facility]    Culture   Final    FEW METHICILLIN RESISTANT STAPHYLOCOCCUS AUREUS Note: RIFAMPIN AND GENTAMICIN SHOULD NOT BE USED AS SINGLE DRUGS FOR TREATMENT OF STAPH INFECTIONS. This organism DOES NOT demonstrate inducible Clindamycin resistance in vitro. CRITICAL RESULT CALLED TO, READ BACK BY  AND VERIFIED WITH: DARLA T 12/6 @  945 BY REAMM Performed at [facility]    Report Status 12/14/2013 FINAL  Final   Organism ID, Bacteria METHICILLIN RESISTANT STAPHYLOCOCCUS AUREUS  Final      Susceptibility   Methicillin resistant staphylococcus aureus - MIC*    CLINDAMYCIN <=0.25 SENSITIVE Sensitive     ERYTHROMYCIN >=8 RESISTANT Resistant     GENTAMICIN <=0.5 SENSITIVE Sensitive     LEVOFLOXACIN 4 INTERMEDIATE Intermediate     OXACILLIN >=4 RESISTANT Resistant     PENICILLIN >=0.5 RESISTANT Resistant     RIFAMPIN <=0.5 SENSITIVE Sensitive     TRIMETH/SULFA <=10 SENSITIVE Sensitive     VANCOMYCIN 1 SENSITIVE Sensitive     TETRACYCLINE <=1 SENSITIVE Sensitive     * FEW METHICILLIN RESISTANT STAPHYLOCOCCUS AUREUS  Culture, blood (routine x 2)     Status: None (Preliminary result)   Collection Time: 12/12/13  7:03 PM  Result Value Ref Range Status   Specimen Description BLOOD RIGHT ARM  Final   Special Requests BOTTLES DRAWN AEROBIC AND ANAEROBIC 10CC  Final   Culture  Setup Time   Final    12/13/2013 01:04 Performed at [facility]    Culture   Final           BLOOD CULTURE RECEIVED NO GROWTH TO DATE CULTURE WILL BE HELD FOR 5 DAYS BEFORE ISSUING A FINAL NEGATIVE REPORT Performed at [facility]    Report Status PENDING  Incomplete  Culture, blood (routine x 2)     Status: None (Preliminary result)   Collection Time: 12/12/13  7:15 PM  Result Value Ref Range Status   Specimen Description BLOOD LEFT HAND  Final   Special Requests BOTTLES DRAWN AEROBIC AND ANAEROBIC 10CC  Final   Culture  Setup Time   Final    12/13/2013 01:04 Performed at [facility]    Culture   Final           BLOOD CULTURE RECEIVED NO GROWTH TO DATE CULTURE WILL BE HELD FOR 5 DAYS BEFORE ISSUING A FINAL NEGATIVE REPORT Performed at [facility]    Report Status PENDING  Incomplete  Culture, blood (routine x 2)     Status: None (Preliminary result)    Collection Time: 12/13/13  8:47 PM  Result Value Ref Range Status   Specimen Description BLOOD RIGHT ARM  Final   Special Requests BOTTLES DRAWN AEROBIC ONLY 10CC  Final   Culture  Setup Time   Final    12/14/2013 03:54 Performed at [facility]    Culture   Final           BLOOD CULTURE RECEIVED NO GROWTH TO DATE CULTURE WILL BE HELD FOR 5 DAYS BEFORE ISSUING A FINAL NEGATIVE REPORT Performed at [facility]    Report Status PENDING  Incomplete  Culture, blood (routine x 2)     Status: None (Preliminary result)   Collection Time: 12/13/13  9:03 PM  Result Value Ref Range Status   Specimen Description BLOOD RIGHT ARM  Final   Special Requests BOTTLES DRAWN AEROBIC ONLY 2CC  Final   Culture  Setup Time   Final    12/14/2013 03:54 Performed at [facility]    Culture   Final           BLOOD CULTURE RECEIVED NO GROWTH TO DATE CULTURE WILL BE HELD FOR 5 DAYS BEFORE ISSUING A FINAL NEGATIVE REPORT Performed at [facility]    Report Status PENDING  Incomplete  Culture, Urine     Status: None   Collection Time: 12/14/13 12:15 AM  Result Value Ref Range Status   Specimen Description URINE, CLEAN CATCH  Final   Special Requests NONE  Final   Culture  Setup Time   Final    12/14/2013 01:11 Performed at [place] NO GROWTH Performed at [facility]   Final   Culture NO GROWTH Performed at [facility]   Final   Report Status 12/15/2013 FINAL  Final  Surgical pcr screen     Status: Abnormal   Collection Time: 12/15/13 12:00 AM  Result Value Ref Range Status   MRSA, PCR NEGATIVE NEGATIVE Final   Staphylococcus aureus POSITIVE (A) NEGATIVE Final    Comment:        The Xpert SA Assay (FDA approved for NASAL specimens in patients over 58 years of age), is one component of a comprehensive surveillance program.  Test performance has been validated by [employer] for patients greater than or equal to 65  year old. It is not intended to diagnose infection nor to guide or monitor treatment. RESULT CALLED TO, READ BACK BY AND VERIFIED WITH: C OGIN AJALA,RN 12/15/13 0226 BY RHOLMES   Culture, routine-abscess     Status: None (Preliminary result)   Collection Time: 12/15/13  1:09 AM  Result Value Ref Range Status   Specimen Description ABSCESS RIGHT FOOT  Final   Special Requests MEDIAL PATIENT ON FOLLOWING ZOSYN VANCOMYCIN  Final   Gram Stain   Final    RARE WBC PRESENT,BOTH PMN AND MONONUCLEAR NO SQUAMOUS EPITHELIAL CELLS SEEN NO ORGANISMS SEEN Performed at [facility]    Culture PENDING  Incomplete   Report Status PENDING  Incomplete  Anaerobic culture     Status: None (Preliminary result)   Collection Time: 12/15/13  1:09 AM  Result Value Ref Range Status   Specimen Description ABSCESS RIGHT FOOT  Final   Special Requests MEDIAL PATIENT ON FOLLOWING ZOSYN VANCOMYCIN  Final   Gram Stain   Final    RARE WBC PRESENT,BOTH PMN AND MONONUCLEAR NO SQUAMOUS EPITHELIAL CELLS SEEN NO ORGANISMS SEEN Performed at [facility]    Culture PENDING  Incomplete   Report Status PENDING  Incomplete   Studies/Results: Mr Foot Right Wo Contrast  12/14/2013   CLINICAL DATA:  Right foot cellulitis and abscess.  EXAM: MRI OF THE RIGHT FOREFOOT WITHOUT CONTRAST  TECHNIQUE: Multiplanar, multisequence MR imaging was performed. No  intravenous contrast was administered.  COMPARISON:  12/10/2013  FINDINGS: Extensive subcutaneous edema tracking along the foot, especially the dorsum of the foot. Infiltrative subcutaneous edema tracks along the plantar musculature of the foot.  A 3.4 by 1.2 by 3.0 cm abscess is present tracking from the subcutaneous tissues just superficial to the distal medial band of the plantar fascia (which is focally thickened) and tracking along an within the plantar musculature of the foot plantar to the space between the distal first and second metatarsals.  The prior  abscess plantar to the fourth MTP joint appears as though it has drained to the scan. Overlying ulceration noted.  There is an effusion of the first MTP joint. Questionable increased signal distally in the toes is somewhat diffuse and probably related to field heterogeneity rather than osteomyelitis. Lisfranc joint alignment normal with Lisfranc ligament intact. Spurring along the Chopart joint and along the Lisfranc joint noted.  IMPRESSION: 1. Although the abscess below the fourth MTP joint has been drained or has spontaneously drained to the plantar ulcer surface, there is now an abscess tracking in the medial plantar subcutaneous tissues below the distal first and second metatarsals as described above. Extensive subcutaneous and muscular edema typical for cellulitis and myositis. There is thickening of the medial band of the plantar fascia focally adjacent to the abscess. No definite osteomyelitis.   Electronically Signed   By: Sherryl Barters M.D.   On: 12/14/2013 19:32   Medications: I have reviewed the patient's current medications. Scheduled Meds: . antiseptic oral rinse  7 mL Mouth Rinse q12n4p  . chlorhexidine  15 mL Mouth Rinse BID  . feeding supplement (GLUCERNA SHAKE)  237 mL Oral BID BM  . insulin aspart  0-15 Units Subcutaneous TID WC  . insulin aspart protamine- aspart  45 Units Subcutaneous BID WC  . piperacillin-tazobactam (ZOSYN)  IV  3.375 g Intravenous 3 times per day  . polyethylene glycol  17 g Oral Daily  . pravastatin  20 mg Oral q1800  . vancomycin  2,000 mg Intravenous Q24H  . Warfarin - Pharmacist Dosing Inpatient   Does not apply q1800   Continuous Infusions: . sodium chloride 50 mL/hr at 12/15/13 0432   PRN Meds:.acetaminophen **OR** acetaminophen, dextrose, ondansetron, oxyCODONE-acetaminophen, promethazine, senna-docusate   Assessment/Plan:  Diabetic foot ulcer and abscess, right foot with sepsis: S/p Bedside I&D 12/11/2013.  Wound cx growing few methicillin  resistant staph aureus.  S/p repeat I&D 12/14/2013. No organisms seen on repeat wound culture - continue IV Vancomycin and IV Zosyn - IV fluids 50 mL/hr  - Percocet 5/325mg  1-2 tab q4hr prn pain  Diarrhea, abdominal pain, nausea/vomiting: Acute viral vs bacterial gastroenteritis. May be 2/2 C diff as he has been on antibiotics.  - C diff PCR awaiting collection  Acute on chronic anemia: Hgb 11 down to 6.7 and up to 8.1 after pRBC transfusion. Febrile reaction yesterday 15 minutes after initiating first transfusion.  No respiratory distress or hypotension TRALI or TACO are unlikely. He had no hypotension, flank/back pain or bleeding to suggest acute hemolytic transfusion reaction. No hypotension, angioedema or itching to suggest anaphylaxis. Blood cx negative to date and no hypotension making sepsis less likely. His symptoms of fever without other symptoms makes febrile non-hemolytic reaction, post-op fever or fever due to current infection more likely. LDH elevated but haptoglobin elevated and indirect bilirubin wnl - making hemolysis less likely. Iron and TIBC low with elevated ferritin - consistent with anemia of chronic disease. Vitamin B12 and Folate  wnl.  - Continue to monitor  Suspected Diabetic ketoacidosis secondary to type 2 diabetes: Resolved.    - SSI mod, CBG q AC/HS  - Novolog 70/30 45u BID  AKI on CKD stage 2: Cr down from 2.19 to 2.16 (baseline around 1.6)  - Continue IV fluids.  - Avoid renal toxins   Hypertension:   - Holding home lisinopril-hydrochlorothiazide and Lasix 40 mg given AKI.  Recurrent DVTs on chronic Coumadin therapy: - warfarin per pharm  FEN:  - carb modified diet  DVT ppx: SCDs, warfarin  Dispo: Disposition is deferred at this time, awaiting improvement of current medical problems.  Anticipated discharge in approximately 1-2 day(s).   The patient does have a current PCP Lucious Groves, DO) and does need an [facility] hospital follow-up appointment  after discharge.  The patient does not have transportation limitations that hinder transportation to clinic appointments.  .Services Needed at time of discharge: Y = Yes, Blank = No PT:   OT:   RN:   Equipment:   Other:     LOS: 7 days   Jacques Earthly, MD 12/16/2013, 11:20 AM

## 2013-12-16 NOTE — Progress Notes (Signed)
Subjective: 2 Days Post-Op Procedure(s) (LRB): IRRIGATION AND DEBRIDEMENT RIGHT FOOT (Right) Patient reports pain as mild.  MRSA grew from the intial abscess culture.  Pt on vanc and zosyn.  Abdominal pain has improved.  No n/v currently.  Objective: Vital signs in last 24 hours: Temp:  [98.6 F (37 C)-99.6 F (37.6 C)] 99.6 F (37.6 C) (12/08 1414) Pulse Rate:  [99-114] 104 (12/08 1414) Resp:  [19-22] 22 (12/08 1414) BP: (149-174)/(70-96) 152/80 mmHg (12/08 1414) SpO2:  [94 %-100 %] 95 % (12/08 1414)  Intake/Output from previous day: 12/07 0701 - 12/08 0700 In: 920 [I.V.:200; Blood:670; IV Piggyback:50] Out: -  Intake/Output this shift: Total I/O In: 360 [P.O.:360] Out: -    Recent Labs  12/14/13 0738 12/14/13 1923 12/15/13 0802 12/16/13 0620  HGB 7.0* 7.3* 6.7* 8.1*    Recent Labs  12/15/13 0802 12/15/13 1322 12/16/13 0620  WBC 26.0*  --  28.1*  RBC 2.48* 2.55* 2.91*  HCT 21.7*  --  25.2*  PLT 498*  --  543*    Recent Labs  12/15/13 0802 12/16/13 0620  NA 135* 141  K 4.9 4.6  CL 104 106  CO2 17* 18*  BUN 53* 50*  CREATININE 2.19* 2.16*  GLUCOSE 174* 85  CALCIUM 8.7 8.8    Recent Labs  12/15/13 0802 12/16/13 0620  INR 6.23* 3.78*    PE:  R foot wound plantarly is open and draining serosang fluid.  No necrotic tisue.  No purulence.  No erythema.  There is swelling and warmth around the foot.  The medial wound is open about 4 cm with healthy appearing tissue and no purulence.  Brisk cap refill at the toes.  No lymphadenopathy.  Assessment/Plan: 2 Days Post-Op Procedure(s) (LRB): IRRIGATION AND DEBRIDEMENT RIGHT FOOT (Right) Dressings changed and wounds repacked.  He may bear weight on the foot in a hard sole post op shoe.  IV abx per medicine service.  [name] 12/16/2013, 5:36 PM

## 2013-12-17 DIAGNOSIS — E11649 Type 2 diabetes mellitus with hypoglycemia without coma: Secondary | ICD-10-CM | POA: Insufficient documentation

## 2013-12-17 DIAGNOSIS — E162 Hypoglycemia, unspecified: Secondary | ICD-10-CM | POA: Insufficient documentation

## 2013-12-17 LAB — GLUCOSE, CAPILLARY
GLUCOSE-CAPILLARY: 65 mg/dL — AB (ref 70–99)
Glucose-Capillary: 82 mg/dL (ref 70–99)
Glucose-Capillary: 82 mg/dL (ref 70–99)
Glucose-Capillary: 66 mg/dL — ABNORMAL LOW (ref 70–99)
Glucose-Capillary: 72 mg/dL (ref 70–99)
Glucose-Capillary: 74 mg/dL (ref 70–99)

## 2013-12-17 LAB — CBC WITH DIFFERENTIAL/PLATELET
Basophils Absolute: 0 10*3/uL (ref 0.0–0.1)
Basophils Relative: 0 % (ref 0–1)
EOS ABS: 0.5 10*3/uL (ref 0.0–0.7)
Eosinophils Relative: 2 % (ref 0–5)
HCT: 23.9 % — ABNORMAL LOW (ref 39.0–52.0)
Hemoglobin: 7.5 g/dL — ABNORMAL LOW (ref 13.0–17.0)
Lymphocytes Relative: 10 % — ABNORMAL LOW (ref 12–46)
Lymphs Abs: 2.3 10*3/uL (ref 0.7–4.0)
MCH: 26.8 pg (ref 26.0–34.0)
MCHC: 31.4 g/dL (ref 30.0–36.0)
MCV: 85.4 fL (ref 78.0–100.0)
MONO ABS: 1.8 10*3/uL — AB (ref 0.1–1.0)
Monocytes Relative: 8 % (ref 3–12)
NEUTROS PCT: 80 % — AB (ref 43–77)
Neutro Abs: 18.5 10*3/uL — ABNORMAL HIGH (ref 1.7–7.7)
PLATELETS: 579 10*3/uL — AB (ref 150–400)
RBC: 2.8 MIL/uL — ABNORMAL LOW (ref 4.22–5.81)
RDW: 17.4 % — AB (ref 11.5–15.5)
WBC: 23.1 10*3/uL — ABNORMAL HIGH (ref 4.0–10.5)

## 2013-12-17 LAB — BASIC METABOLIC PANEL
ANION GAP: 15 (ref 5–15)
BUN: 45 mg/dL — ABNORMAL HIGH (ref 6–23)
CALCIUM: 8.8 mg/dL (ref 8.4–10.5)
CO2: 19 mEq/L (ref 19–32)
CREATININE: 2.22 mg/dL — AB (ref 0.50–1.35)
Chloride: 105 mEq/L (ref 96–112)
GFR calc Af Amer: 42 mL/min — ABNORMAL LOW (ref >90)
GFR, EST NON AFRICAN AMERICAN: 36 mL/min — AB (ref >90)
Glucose, Bld: 106 mg/dL — ABNORMAL HIGH (ref 70–99)
Potassium: 4.3 mEq/L (ref 3.7–5.3)
Sodium: 139 mEq/L (ref 137–147)

## 2013-12-17 LAB — PROTIME-INR
INR: 4.38 — ABNORMAL HIGH (ref 0.00–1.49)
PROTHROMBIN TIME: 42.1 s — AB (ref 11.6–15.2)

## 2013-12-17 LAB — ANAEROBIC CULTURE

## 2013-12-17 LAB — CLOSTRIDIUM DIFFICILE BY PCR: CDIFFPCR: NEGATIVE

## 2013-12-17 MED ORDER — FUROSEMIDE 40 MG PO TABS
40.0000 mg | ORAL_TABLET | Freq: Every day | ORAL | Status: DC
  Administered 2013-12-17 – 2013-12-20 (×3): 40 mg via ORAL
  Filled 2013-12-17 (×5): qty 1

## 2013-12-17 NOTE — Evaluation (Signed)
Occupational Therapy Evaluation Patient Details Name: [name] MRN: [id number] DOB: 02/13/1975 Today's Date: 12/17/2013    History of Present Illness 38 year old african [place] gentleman with type 2 diabetes on insulin, obesity, recurrent DVT on chronic coumadin presenting with a chronic (1 year) non-healing right foot plantar ulcer - non-necrotic, circular, with irregular margins, shallow, tender and warm. The patient has good pedal pulses bilaterally. I examined the patient personally and agree with Dr Trudee Kuster findings. The patients reports systemic symptoms of fever and chills, nausea and vomiting. The patient reports non-compliance with meds including insulin.    Clinical Impression   Pt presents at an overall min assist to min guard assist level for selfcare tasks.  Limited session secondary to pt not having his post of WBing shoe.  Recommended use of shower seat if MD OKs him to shower.  His girlfriend will check to see if they can borrow one.  Feel he will benefit from acute care OT to help increase independence.  Do not feel pt will need post acute OT however.     Follow Up Recommendations  No OT follow up;Supervision/Assistance - 24 hour    Equipment Recommendations  Tub/shower seat;Other (comment) (Pt's girlfriend with check to see if she can borrow seat.)       Precautions / Restrictions Precautions Precautions: Fall Precaution Comments: R foot I&D, obese Required Braces or Orthoses:  (post op shoe recommended in MD notes but no order placed) Restrictions Weight Bearing Restrictions: No Other Position/Activity Restrictions: Pt should have post op shoe for wear with mobility         Balance     Sitting balance-[name]: Normal     Standing balance support: Bilateral upper extremity supported Standing balance-[name]: Fair                              ADL Overall ADL's : Needs assistance/impaired Eating/Feeding: Independent;Sitting    Grooming: Wash/dry hands;Wash/dry face;Oral care;Min guard;Standing   Upper Body Bathing: Supervision/ safety;Sitting   Lower Body Bathing: Minimal assistance;Sit to/from stand   Upper Body Dressing : Supervision/safety;Sitting   Lower Body Dressing: Minimal assistance;Sit to/from stand   Toilet Transfer: Minimal assistance;Comfort height toilet;Grab bars;RW;Ambulation Toilet Transfer Details (indicate cue type and reason): simulated having counter on the right and use of the RW on the left. Toileting- Clothing Manipulation and Hygiene: Minimal assistance;Sit to/from stand   Tub/ Shower Transfer: Minimal assistance;Ambulation;Rolling walker Tub/Shower Transfer Details (indicate cue type and reason): simulated stepping over edge of tum with use of the RW for support and simulated tub wall. Functional mobility during ADLs: Minimal assistance General ADL Comments: Pt with flat affect throughout session but participated with increased time to initiate.  Pt's grilfriend states she will check with her work to see if they have a shower seat she can have/borrow so pt will not need to purchase.  Feel he will need min to min guard assist for sit to stand from the toilet at home.  He feels that wide commode seat will not fit between sink and tub.          Perception Perception Perception Tested?: No   Praxis Praxis Praxis tested?: Within functional limits    Pertinent Vitals/Pain Pain Assessment: Faces Faces Pain Scale: Hurts little more Pain Location: right foot Pain Intervention(s): Repositioned;Monitored during session     Hand Dominance Right   Extremity/Trunk Assessment Upper Extremity Assessment Upper Extremity Assessment: Overall  WFL for tasks assessed   Lower Extremity Assessment Lower Extremity Assessment: Defer to PT evaluation   Cervical / Trunk Assessment Cervical / Trunk Assessment: Normal   Communication Communication Communication: No difficulties   Cognition  Arousal/Alertness: Awake/alert Behavior During Therapy: WFL for tasks assessed/performed Overall Cognitive Status: Within Functional Limits for tasks assessed                                Home Living Family/patient expects to be discharged to:: Private residence Living Arrangements: Spouse/significant other;Parent Available Help at Discharge: Family (either gf or mother) Type of Home: House (townhome at mothers) Home Access: Stairs to enter Technical brewer of Steps: 4 at [employer]  Entrance Stairs-Rails: Right;None Home Layout: Two level (at mothers) Alternate Level Stairs-Number of Steps: 12 Alternate Level Stairs-Rails: Left Bathroom Shower/Tub: Tub/shower unit;Curtain Shower/tub characteristics: Architectural technologist: Standard (Mom's house may be handicapped height, girlfriends is standard.)     Home Equipment: None          Prior Functioning/Environment Level of Independence: Independent             OT Diagnosis: Generalized weakness;Acute pain   OT Problem List: Decreased strength;Impaired balance (sitting and/or standing);Pain;Decreased knowledge of use of DME or AE   OT Treatment/Interventions: Self-care/ADL training;Therapeutic activities;DME and/or AE instruction;Balance training;Patient/family education    OT Goals(Current goals can be found in the care plan section) Acute Rehab OT Goals Patient Stated Goal: Pt did not state during session but does want to go home. OT Goal Formulation: With patient/family Time For Goal Achievement: 12/24/13 Potential to Achieve Goals: Good  OT Frequency: Min 2X/week              End of Session Equipment Utilized During Treatment: Rolling walker Nurse Communication: Mobility status  Activity Tolerance: Patient tolerated treatment well Patient left: in chair;with call bell/phone within reach;with family/visitor present   Time: 1421-1505 OT Time Calculation (min): 44 min Charges:  OT General  Charges $OT Visit: 1 Procedure OT Evaluation $Initial OT Evaluation Tier I: 1 Procedure OT Treatments $Self Care/Home Management : 38-52 mins Jadasia Haws OTR/L 12/17/2013, 3:24 PM

## 2013-12-17 NOTE — Progress Notes (Signed)
PT Cancellation Note  Patient Details Name: [name] MRN: [id number] DOB: Mar 30, 1975   Cancelled Treatment:    Reason Eval/Treat Not Completed: Other (comment) . Pt must have post-op shoe to WB per MD note. Call made to ortho tech. Will attempt to see pt when shoe is available. Pt in chair upon PT arrival.   [name], [place] 12/17/2013, 3:10 PM

## 2013-12-17 NOTE — Plan of Care (Signed)
Problem: Phase III Progression Outcomes Goal: Activity at appropriate level-compared to baseline (UP IN CHAIR FOR HEMODIALYSIS)  Outcome: Progressing Goal: Temperature < 100 Outcome: Progressing Goal: Discharge plan remains appropriate-arrangements made Outcome: Progressing Goal: IV Meds changed to PO Outcome: Progressing  Problem: Discharge Progression Outcomes Goal: Discharge plan in place and appropriate Outcome: Progressing Goal: Pain controlled with appropriate interventions Outcome: Progressing Goal: Tolerating diet Outcome: Progressing Goal: Activity appropriate for discharge plan Outcome: Progressing Goal: Wound improving/decreased edema Outcome: Progressing

## 2013-12-17 NOTE — Progress Notes (Signed)
  PROGRESS NOTE MEDICINE TEACHING ATTENDING   Day 8 of stay Patient name: [name]   Medical record number: [id number] Date of birth: 1975-10-21   Feels well today.  No diarrhea since yesterday.  Less abdominal pain.  Blood pressure 140/55, pulse 106,  temperature 98 F (36.7 C),  temperature source Oral,  resp. rate 20, SpO2 95 %. height 6\' 2"  (1.88 m), weight 365 lb 6.4 oz (165.744 kg),   General: Propped up on a chair. HEENT: PERRL, EOMI, no scleral icterus. Heart: RRR, no rubs, murmurs or gallops. Lungs: Clear to auscultation bilaterally, no wheezes, rales, or rhonchi. Abdomen: Soft, nontender, nondistended, BS present. Extremities: Dressing on right foot Neuro: Alert and oriented X3.   Lab 12/13/13 2037 12/14/13 0738 12/15/13 0802 12/16/13 0620 12/17/13 0340  CREATININE 2.06* 2.29* 2.19* 2.16* 2.22*    HGB 7.0* 7.3* 6.7* 8.1* 7.5*   WBC 26.3* 27.1* 26.0* 28.1* 23.1*    CDiff negative. Wound culture prelim report - MRSA.  Likely antibiotic associated diarrhea. If does not feel better then start probiotics.  Agree with stopping Zocyn IV.  WBCs trending down.  IV antibiotic therapy till signs of infection show resolution. Mechanical off loading important.  Agree with starting Lasix.   I have discussed the care of this patient with my IM team residents.  Please see the resident note for details.  [place], [place] 12/17/2013, 1:44 PM.

## 2013-12-17 NOTE — Progress Notes (Signed)
PT Cancellation Note  Patient Details Name: [name] MRN: [id number] DOB: Mar 07, 1975   Cancelled Treatment:    Reason Eval/Treat Not Completed: Medical issues which prohibited therapy (nausea and vomitting)   Lorriane Shire 12/17/2013, 9:31 AM

## 2013-12-17 NOTE — Progress Notes (Signed)
Subjective: Reports he is still having abdominal pain and nausea. Denies chest pain or shortness of breath. Last BM was yesterday. No BM since.  Objective: Vital signs in last 24 hours: Filed Vitals:   12/16/13 0718 12/16/13 1414 12/16/13 2253 12/17/13 0625  BP: 174/72 152/80 146/67 140/55  Pulse: 114 104 103 106  Temp: 98.6 F (37 C) 99.6 F (37.6 C) 98.2 F (36.8 C) 98 F (36.7 C)  TempSrc: Oral Axillary Oral Oral  Resp: 20 22 20 20   Height:      Weight:      SpO2: 98% 95% 94% 95%   Weight change:   Intake/Output Summary (Last 24 hours) at 12/17/13 1354 Last data filed at 12/17/13 0529  Gross per 24 hour  Intake 1426.42 ml  Output      0 ml  Net 1426.42 ml   General: NAD HEENT: [place]/AT Cardiac: RRR, no rubs, murmurs or gallops Pulm: clear to auscultation bilaterally, moving normal volumes of air Abd: soft, + BS, nondistended, mild diffuse tenderness most in LUQ Ext: +1 B/L LE edema, Dressing in place on R foot, minimally TTP Neuro: alert and oriented X3, responding appropriately, follows commands, able to sit up for lung exam  Lab Results: Basic Metabolic Panel:  Recent Labs Lab 12/16/13 0620 12/17/13 0340  NA 141 139  K 4.6 4.3  CL 106 105  CO2 18* 19  GLUCOSE 85 106*  BUN 50* 45*  CREATININE 2.16* 2.22*  CALCIUM 8.8 8.8   Liver Function Tests:  Recent Labs Lab 12/14/13 0738 12/15/13 1322  AST 23 24  ALT 23 26  ALKPHOS 224* 261*  BILITOT 1.6* 1.2  PROT 7.2 7.9  ALBUMIN 1.9* 1.9*   CBC:  Recent Labs Lab 12/16/13 0620 12/17/13 0340  WBC 28.1* 23.1*  NEUTROABS 23.1* 18.5*  HGB 8.1* 7.5*  HCT 25.2* 23.9*  MCV 86.6 85.4  PLT 543* 579*   CBG:  Recent Labs Lab 12/16/13 0808 12/16/13 1150 12/16/13 1707 12/16/13 2239 12/17/13 0817 12/17/13 1211  GLUCAP 76 177* 188* 146* 82 82   Coagulation:  Recent Labs Lab 12/14/13 0738 12/15/13 0802 12/16/13 0620 12/17/13 0340  LABPROT 50.0* 55.5* 37.6* 42.1*  INR 5.44* 6.23* 3.78*  4.38*   Urine Drug Screen: Drugs of Abuse     Component Value Date/Time   LABOPIA POSITIVE* 07/12/2011 0038   COCAINSCRNUR NONE DETECTED 07/12/2011 0038   LABBENZ NONE DETECTED 07/12/2011 0038   AMPHETMU NONE DETECTED 07/12/2011 0038   THCU NONE DETECTED 07/12/2011 0038   LABBARB NONE DETECTED 07/12/2011 0038    Micro Results: Recent Results (from the past 240 hour(s))  Culture, Blood     Status: None   Collection Time: 12/09/13  4:28 PM  Result Value Ref Range Status   Organism ID, Bacteria NO GROWTH 5 DAYS  Final  Culture, Blood     Status: None   Collection Time: 12/09/13  4:44 PM  Result Value Ref Range Status   Organism ID, Bacteria NO GROWTH 5 DAYS  Final  Anaerobic culture     Status: None   Collection Time: 12/12/13  2:29 AM  Result Value Ref Range Status   Specimen Description ABSCESS RIGHT FOOT  Final   Special Requests NONE  Final   Gram Stain   Final    ABUNDANT WBC PRESENT,BOTH PMN AND MONONUCLEAR NO SQUAMOUS EPITHELIAL CELLS SEEN RARE GRAM POSITIVE COCCI IN PAIRS IN CLUSTERS Performed at [facility]    Culture   Final  NO ANAEROBES ISOLATED Performed at [facility]    Report Status 12/17/2013 FINAL  Final  Culture, routine-abscess     Status: None   Collection Time: 12/12/13  2:29 AM  Result Value Ref Range Status   Specimen Description ABSCESS RIGHT FOOT  Final   Special Requests NONE  Final   Gram Stain   Final    ABUNDANT WBC PRESENT,BOTH PMN AND MONONUCLEAR NO SQUAMOUS EPITHELIAL CELLS SEEN RARE GRAM POSITIVE COCCI IN PAIRS IN CLUSTERS Performed at [facility]    Culture   Final    FEW METHICILLIN RESISTANT STAPHYLOCOCCUS AUREUS Note: RIFAMPIN AND GENTAMICIN SHOULD NOT BE USED AS SINGLE DRUGS FOR TREATMENT OF STAPH INFECTIONS. This organism DOES NOT demonstrate inducible Clindamycin resistance in vitro. CRITICAL RESULT CALLED TO, READ BACK BY AND VERIFIED WITH: DARLA T 12/6 @  945 BY REAMM Performed at [facility]    Report Status 12/14/2013 FINAL  Final   Organism ID, Bacteria METHICILLIN RESISTANT STAPHYLOCOCCUS AUREUS  Final      Susceptibility   Methicillin resistant staphylococcus aureus - MIC*    CLINDAMYCIN <=0.25 SENSITIVE Sensitive     ERYTHROMYCIN >=8 RESISTANT Resistant     GENTAMICIN <=0.5 SENSITIVE Sensitive     LEVOFLOXACIN 4 INTERMEDIATE Intermediate     OXACILLIN >=4 RESISTANT Resistant     PENICILLIN >=0.5 RESISTANT Resistant     RIFAMPIN <=0.5 SENSITIVE Sensitive     TRIMETH/SULFA <=10 SENSITIVE Sensitive     VANCOMYCIN 1 SENSITIVE Sensitive     TETRACYCLINE <=1 SENSITIVE Sensitive     * FEW METHICILLIN RESISTANT STAPHYLOCOCCUS AUREUS  Culture, blood (routine x 2)     Status: None (Preliminary result)   Collection Time: 12/12/13  7:03 PM  Result Value Ref Range Status   Specimen Description BLOOD RIGHT ARM  Final   Special Requests BOTTLES DRAWN AEROBIC AND ANAEROBIC 10CC  Final   Culture  Setup Time   Final    12/13/2013 01:04 Performed at [facility]    Culture   Final           BLOOD CULTURE RECEIVED NO GROWTH TO DATE CULTURE WILL BE HELD FOR 5 DAYS BEFORE ISSUING A FINAL NEGATIVE REPORT Performed at [facility]    Report Status PENDING  Incomplete  Culture, blood (routine x 2)     Status: None (Preliminary result)   Collection Time: 12/12/13  7:15 PM  Result Value Ref Range Status   Specimen Description BLOOD LEFT HAND  Final   Special Requests BOTTLES DRAWN AEROBIC AND ANAEROBIC 10CC  Final   Culture  Setup Time   Final    12/13/2013 01:04 Performed at [facility]    Culture   Final           BLOOD CULTURE RECEIVED NO GROWTH TO DATE CULTURE WILL BE HELD FOR 5 DAYS BEFORE ISSUING A FINAL NEGATIVE REPORT Performed at [facility]    Report Status PENDING  Incomplete  Culture, blood (routine x 2)     Status: None (Preliminary result)   Collection Time: 12/13/13  8:47 PM  Result Value Ref Range Status   Specimen  Description BLOOD RIGHT ARM  Final   Special Requests BOTTLES DRAWN AEROBIC ONLY 10CC  Final   Culture  Setup Time   Final    12/14/2013 03:54 Performed at [facility]    Culture   Final           BLOOD  CULTURE RECEIVED NO GROWTH TO DATE CULTURE WILL BE HELD FOR 5 DAYS BEFORE ISSUING A FINAL NEGATIVE REPORT Performed at [facility]    Report Status PENDING  Incomplete  Culture, blood (routine x 2)     Status: None (Preliminary result)   Collection Time: 12/13/13  9:03 PM  Result Value Ref Range Status   Specimen Description BLOOD RIGHT ARM  Final   Special Requests BOTTLES DRAWN AEROBIC ONLY 2CC  Final   Culture  Setup Time   Final    12/14/2013 03:54 Performed at [facility]    Culture   Final           BLOOD CULTURE RECEIVED NO GROWTH TO DATE CULTURE WILL BE HELD FOR 5 DAYS BEFORE ISSUING A FINAL NEGATIVE REPORT Performed at [facility]    Report Status PENDING  Incomplete  Culture, Urine     Status: None   Collection Time: 12/14/13 12:15 AM  Result Value Ref Range Status   Specimen Description URINE, CLEAN CATCH  Final   Special Requests NONE  Final   Culture  Setup Time   Final    12/14/2013 01:11 Performed at [place] NO GROWTH Performed at [facility]   Final   Culture NO GROWTH Performed at [facility]   Final   Report Status 12/15/2013 FINAL  Final  Surgical pcr screen     Status: Abnormal   Collection Time: 12/15/13 12:00 AM  Result Value Ref Range Status   MRSA, PCR NEGATIVE NEGATIVE Final   Staphylococcus aureus POSITIVE (A) NEGATIVE Final    Comment:        The Xpert SA Assay (FDA approved for NASAL specimens in patients over 79 years of age), is one component of a comprehensive surveillance program.  Test performance has been validated by [employer] for patients greater than or equal to 78 year old. It is not intended to diagnose infection nor to guide or monitor  treatment. RESULT CALLED TO, READ BACK BY AND VERIFIED WITH: C OGIN AJALA,RN 12/15/13 0226 BY RHOLMES   Culture, routine-abscess     Status: None (Preliminary result)   Collection Time: 12/15/13  1:09 AM  Result Value Ref Range Status   Specimen Description ABSCESS RIGHT FOOT  Final   Special Requests MEDIAL PATIENT ON FOLLOWING ZOSYN VANCOMYCIN  Final   Gram Stain   Final    RARE WBC PRESENT,BOTH PMN AND MONONUCLEAR NO SQUAMOUS EPITHELIAL CELLS SEEN NO ORGANISMS SEEN Performed at [facility]    Culture   Final    NO GROWTH 2 DAYS Performed at [facility]    Report Status PENDING  Incomplete  Anaerobic culture     Status: None (Preliminary result)   Collection Time: 12/15/13  1:09 AM  Result Value Ref Range Status   Specimen Description ABSCESS RIGHT FOOT  Final   Special Requests MEDIAL PATIENT ON FOLLOWING ZOSYN VANCOMYCIN  Final   Gram Stain   Final    RARE WBC PRESENT,BOTH PMN AND MONONUCLEAR NO SQUAMOUS EPITHELIAL CELLS SEEN NO ORGANISMS SEEN Performed at [facility]    Culture   Final    NO ANAEROBES ISOLATED; CULTURE IN PROGRESS FOR 5 DAYS Performed at [facility]    Report Status PENDING  Incomplete  Clostridium Difficile by PCR     Status: None   Collection Time: 12/16/13  5:34 PM  Result Value Ref Range Status  C difficile by pcr NEGATIVE NEGATIVE Final   Studies/Results: Dg Chest 2 View  12/16/2013   CLINICAL DATA:  Productive cough x 2 days. SOB "sometimes" says patient. No chest pain. Hx of Right foot surgery 2 days ago. Hx of pulmonary embolism 09/2002, HTN, Type 1 diabetes mellitus, diabetic ulcer of right foot, CKD - stage 3  EXAM: CHEST - 2 VIEW  COMPARISON:  12/13/2013  FINDINGS: Persistent low lung volumes with resultant crowding of bronchovascular structures. No focal infiltrate or overt edema. Heart size upper limits normal for technique, stable. No effusion. Visualized skeletal structures are unremarkable.   IMPRESSION: 1. Persistent low volumes.  No acute disease.   Electronically Signed   By: Arne Cleveland M.D.   On: 12/16/2013 19:45   Medications: I have reviewed the patient's current medications. Scheduled Meds: . antiseptic oral rinse  7 mL Mouth Rinse q12n4p  . chlorhexidine  15 mL Mouth Rinse BID  . feeding supplement (GLUCERNA SHAKE)  237 mL Oral BID BM  . furosemide  40 mg Oral Daily  . insulin aspart  0-15 Units Subcutaneous TID WC  . insulin aspart protamine- aspart  45 Units Subcutaneous BID WC  . pravastatin  20 mg Oral q1800  . vancomycin  2,000 mg Intravenous Q24H  . Warfarin - Pharmacist Dosing Inpatient   Does not apply q1800   Continuous Infusions:   PRN Meds:.acetaminophen **OR** acetaminophen, dextrose, ondansetron, oxyCODONE-acetaminophen, promethazine, senna-docusate   Assessment/Plan:  Diabetic foot ulcer and abscess, right foot with sepsis: S/p Bedside I&D 12/11/2013.  Wound cx growing few methicillin resistant staph aureus.  S/p repeat I&D 12/14/2013. No organisms seen on repeat wound culture - continue IV Vancomycin (day 9)  - d/c IV zosyn - d/c IV fluids  - Percocet 5/325mg  1-2 tab q4hr prn pain  Diarrhea, abdominal pain, nausea/vomiting: C diff PCR negative Likely 2/2 antibiotic associated diarrhea.  - continue to monitor  Acute on chronic anemia: Hgb 7.5 from 8.1  - Continue to monitor  Suspected Diabetic ketoacidosis secondary to type 2 diabetes: Resolved.    - SSI mod, CBG q AC/HS  - Novolog 70/30 45u BID  AKI on CKD stage 2: Cr up to 2.22 from 2.16 (baseline around 1.6)  - d/c IV fluids  - restart home lasix 40mg  daily   Hypertension:   - Holding home lisinopril-hydrochlorothiazide.  - restart home lasix 40mg  daily  Recurrent DVTs on chronic Coumadin therapy: - warfarin per pharm  FEN:  - carb modified diet  DVT ppx: SCDs, warfarin  Dispo: Disposition is deferred at this time, awaiting improvement of current medical problems.   Anticipated discharge in approximately 1-2 day(s).   The patient does have a current PCP Lucious Groves, DO) and does need an [facility] hospital follow-up appointment after discharge.  The patient does not have transportation limitations that hinder transportation to clinic appointments.  .Services Needed at time of discharge: Y = Yes, Blank = No PT:   OT:   RN:   Equipment:   Other:     LOS: 8 days   Jacques Earthly, MD 12/17/2013, 1:54 PM

## 2013-12-17 NOTE — Progress Notes (Signed)
Orthopedic Tech Progress Note Patient Details:  [name] 04-01-1975 [id number] Delivered post-op shoe to pt.'s nurse. Ortho Devices Type of Ortho Device: Postop shoe/boot Ortho Device/Splint Location: RLE Ortho Device/Splint Interventions: Other (comment)   Darrol Poke 12/17/2013, 5:17 PM

## 2013-12-17 NOTE — Progress Notes (Signed)
[facility] for Warfarin Indication: recurrent DVTs  No Known Allergies  Patient Measurements: Height: 6\' 2"  (188 cm) Weight: (!) 365 lb 6.4 oz (165.744 kg) IBW/kg (Calculated) : 82.2  Vital Signs: Temp: 98 F (36.7 C) (12/09 0625) Temp Source: Oral (12/09 0625) BP: 140/55 mmHg (12/09 0625) Pulse Rate: 106 (12/09 0625)  Labs:  Recent Labs  12/15/13 0802 12/16/13 0620 12/17/13 0340  HGB 6.7* 8.1* 7.5*  HCT 21.7* 25.2* 23.9*  PLT 498* 543* 579*  LABPROT 55.5* 37.6* 42.1*  INR 6.23* 3.78* 4.38*  CREATININE 2.19* 2.16* 2.22*    Estimated Creatinine Clearance: 74.5 mL/min (by C-G formula based on Cr of 2.22).  Assessment: 38yom admitted for cellulitis.    AC: Has h/o recurrent DVTs. He is supposed to be on warfarin as outpt but is non-compliant. INR 1.28 on admission. INR trending back up to 4.38 today after small dose given yesterday (supratherapeutic INR peaked 12/7 at 6.23). Appears pt is sensitive to coumadin at this time. Hgb 7.5 (was supposed to receive PRBC x 1 12/7 but after 15 min, pt experienced temp and some chills so PRBC taken down). Plt high.  Home dose: Per outpatient note last home regimen warfarin 7.5mg  daily and 10mg  Mon and Thur.   Goal of Therapy:  INR 2-3  Plan:  - No coumadin tonight - Daily INR  Sherlon Handing, PharmD, BCPS Clinical pharmacist, pager [phone] 12/17/2013 10:30 AM

## 2013-12-18 ENCOUNTER — Encounter (HOSPITAL_COMMUNITY): Payer: Self-pay | Admitting: Cardiology

## 2013-12-18 DIAGNOSIS — E872 Acidosis: Secondary | ICD-10-CM

## 2013-12-18 DIAGNOSIS — R197 Diarrhea, unspecified: Secondary | ICD-10-CM | POA: Insufficient documentation

## 2013-12-18 DIAGNOSIS — D72829 Elevated white blood cell count, unspecified: Secondary | ICD-10-CM | POA: Diagnosis present

## 2013-12-18 DIAGNOSIS — R791 Abnormal coagulation profile: Secondary | ICD-10-CM | POA: Diagnosis present

## 2013-12-18 LAB — BASIC METABOLIC PANEL
ANION GAP: 16 — AB (ref 5–15)
BUN: 38 mg/dL — ABNORMAL HIGH (ref 6–23)
CO2: 17 meq/L — AB (ref 19–32)
CREATININE: 1.94 mg/dL — AB (ref 0.50–1.35)
Calcium: 8.7 mg/dL (ref 8.4–10.5)
Chloride: 103 mEq/L (ref 96–112)
GFR calc Af Amer: 49 mL/min — ABNORMAL LOW (ref >90)
GFR calc non Af Amer: 42 mL/min — ABNORMAL LOW (ref >90)
Glucose, Bld: 90 mg/dL (ref 70–99)
Potassium: 3.9 mEq/L (ref 3.7–5.3)
Sodium: 136 mEq/L — ABNORMAL LOW (ref 137–147)

## 2013-12-18 LAB — CBC WITH DIFFERENTIAL/PLATELET
BASOS PCT: 0 % (ref 0–1)
Basophils Absolute: 0 10*3/uL (ref 0.0–0.1)
EOS PCT: 1 % (ref 0–5)
Eosinophils Absolute: 0.3 10*3/uL (ref 0.0–0.7)
HCT: 22.9 % — ABNORMAL LOW (ref 39.0–52.0)
HEMOGLOBIN: 7.2 g/dL — AB (ref 13.0–17.0)
Lymphocytes Relative: 7 % — ABNORMAL LOW (ref 12–46)
Lymphs Abs: 1.9 10*3/uL (ref 0.7–4.0)
MCH: 26.8 pg (ref 26.0–34.0)
MCHC: 31.4 g/dL (ref 30.0–36.0)
MCV: 85.1 fL (ref 78.0–100.0)
MONO ABS: 2.4 10*3/uL — AB (ref 0.1–1.0)
MONOS PCT: 9 % (ref 3–12)
NEUTROS PCT: 83 % — AB (ref 43–77)
Neutro Abs: 22.4 10*3/uL — ABNORMAL HIGH (ref 1.7–7.7)
Platelets: 572 10*3/uL — ABNORMAL HIGH (ref 150–400)
RBC: 2.69 MIL/uL — AB (ref 4.22–5.81)
RDW: 17.2 % — ABNORMAL HIGH (ref 11.5–15.5)
WBC: 27 10*3/uL — AB (ref 4.0–10.5)

## 2013-12-18 LAB — GLUCOSE, CAPILLARY
GLUCOSE-CAPILLARY: 91 mg/dL (ref 70–99)
GLUCOSE-CAPILLARY: 122 mg/dL — AB (ref 70–99)
GLUCOSE-CAPILLARY: 61 mg/dL — AB (ref 70–99)
Glucose-Capillary: 72 mg/dL (ref 70–99)
Glucose-Capillary: 77 mg/dL (ref 70–99)

## 2013-12-18 LAB — URINALYSIS, ROUTINE W REFLEX MICROSCOPIC
BILIRUBIN URINE: NEGATIVE
Glucose, UA: NEGATIVE mg/dL
KETONES UR: NEGATIVE mg/dL
LEUKOCYTES UA: NEGATIVE
NITRITE: NEGATIVE
Specific Gravity, Urine: 1.016 (ref 1.005–1.030)
UROBILINOGEN UA: 0.2 mg/dL (ref 0.0–1.0)
pH: 5 (ref 5.0–8.0)

## 2013-12-18 LAB — CULTURE, ROUTINE-ABSCESS: CULTURE: NO GROWTH

## 2013-12-18 LAB — URINE MICROSCOPIC-ADD ON

## 2013-12-18 LAB — LACTIC ACID, PLASMA: Lactic Acid, Venous: 0.7 mmol/L (ref 0.5–2.2)

## 2013-12-18 LAB — PROTIME-INR
INR: 4.85 — ABNORMAL HIGH (ref 0.00–1.49)
PROTHROMBIN TIME: 45.7 s — AB (ref 11.6–15.2)

## 2013-12-18 MED ORDER — INSULIN ASPART PROT & ASPART (70-30 MIX) 100 UNIT/ML ~~LOC~~ SUSP
30.0000 [IU] | Freq: Two times a day (BID) | SUBCUTANEOUS | Status: DC
Start: 2013-12-18 — End: 2013-12-19

## 2013-12-18 MED ORDER — MENTHOL 3 MG MT LOZG
1.0000 | LOZENGE | OROMUCOSAL | Status: DC | PRN
  Administered 2013-12-18: 3 mg via ORAL
  Filled 2013-12-18: qty 9

## 2013-12-18 MED ORDER — HYDROMORPHONE HCL 1 MG/ML IJ SOLN
0.5000 mg | INTRAMUSCULAR | Status: AC | PRN
  Administered 2013-12-18 – 2013-12-19 (×3): 0.5 mg via INTRAVENOUS
  Filled 2013-12-18 (×3): qty 1

## 2013-12-18 NOTE — Progress Notes (Signed)
Paged multiple numbers to notify MD that pt's 70/30 insulin was held due to blood sugar of 61 and pt became nauseated and vomited. Pt reported he didn't think he would be able to eat. Gave patient PRN medication for nausea and vomiting. Spoke with Dr. Gordy Levan who returned page.

## 2013-12-18 NOTE — Progress Notes (Signed)
Inpatient Diabetes Program Recommendations  AACE/ADA: New Consensus Statement on Inpatient Glycemic Control (2013)  Target Ranges:  Prepandial:   less than 140 mg/dL      Peak postprandial:   less than 180 mg/dL (1-2 hours)      Critically ill patients:  140 - 180 mg/dL   Results for KYION, RAYOS (MRN [id number]) as of 12/18/2013 10:40  Ref. Range 12/17/2013 08:17 12/17/2013 12:11 12/17/2013 17:11 12/17/2013 18:37 12/17/2013 22:04 12/17/2013 23:06 12/18/2013 04:42 12/18/2013 07:49  Glucose-Capillary Latest Range: 70-99 mg/dL 82 82 66 (L) 72 65 (L) 74 91 122 (H)   Diabetes history: DM2 Outpatient Diabetes medications: 70/30 50 units QAM, 70/30 45 units QPM, Metformin 1000 mg BID Current orders for Inpatient glycemic control: 70/30 45 units BID, Novolog 0-15 units TID with meals  Inpatient Diabetes Program Recommendations Insulin - Basal: Noted patient did not receive evening dose of 70/30 yesterday due to recurrent hypoglycemia. Please consider decreasing 70/30 to 30 units BID.  Thanks, Barnie Alderman, RN, MSN, CCRN, CDE Diabetes Coordinator Inpatient Diabetes Program [phone] (Team Pager) [phone] ([facility] office) [phone] [facility] office)

## 2013-12-18 NOTE — Progress Notes (Signed)
Subjective: No acute events overnight. Minimal nausea this morning. Last had emesis yesterday afternoon. No diarrhea. Last BM Tuesday. His abdominal pain is stable. His foot pain is controlled on PO pain meds.  Objective: Vital signs in last 24 hours: Filed Vitals:   12/17/13 0625 12/17/13 2202 12/18/13 0220 12/18/13 0606  BP: 140/55 153/76 155/83 168/73  Pulse: 106 102 116 111  Temp: 98 F (36.7 C) 99.1 F (37.3 C) 99.1 F (37.3 C) 99.8 F (37.7 C)  TempSrc: Oral Oral Oral Oral  Resp: 20 19 18 17   Height:      Weight:      SpO2: 95% 98% 92% 98%   Weight change:   Intake/Output Summary (Last 24 hours) at 12/18/13 0938 Last data filed at 12/18/13 0734  Gross per 24 hour  Intake      0 ml  Output   1450 ml  Net  -1450 ml   General: NAD HEENT: [place]/AT Cardiac: RRR, no rubs, murmurs or gallops Pulm: clear to auscultation bilaterally, moving normal volumes of air Abd: soft, + BS, nondistended, mild diffuse tenderness most in LUQ Ext: +1 B/L LE edema, Dressing in place on R foot, minimally TTP Neuro: alert and oriented X3, responding appropriately, follows commands, able to sit up for lung exam  Lab Results: Basic Metabolic Panel:  Recent Labs Lab 12/17/13 0340 12/18/13 0428  NA 139 136*  K 4.3 3.9  CL 105 103  CO2 19 17*  GLUCOSE 106* 90  BUN 45* 38*  CREATININE 2.22* 1.94*  CALCIUM 8.8 8.7   Liver Function Tests:  Recent Labs Lab 12/14/13 0738 12/15/13 1322  AST 23 24  ALT 23 26  ALKPHOS 224* 261*  BILITOT 1.6* 1.2  PROT 7.2 7.9  ALBUMIN 1.9* 1.9*   CBC:  Recent Labs Lab 12/17/13 0340 12/18/13 0428  WBC 23.1* 27.0*  NEUTROABS 18.5* 22.4*  HGB 7.5* 7.2*  HCT 23.9* 22.9*  MCV 85.4 85.1  PLT 579* 572*   CBG:  Recent Labs Lab 12/17/13 1711 12/17/13 1837 12/17/13 2204 12/17/13 2306 12/18/13 0442 12/18/13 0749  GLUCAP 66* 72 65* 74 91 122*   Coagulation:  Recent Labs Lab 12/15/13 0802 12/16/13 0620 12/17/13 0340 12/18/13 0428    LABPROT 55.5* 37.6* 42.1* 45.7*  INR 6.23* 3.78* 4.38* 4.85*   Urine Drug Screen: Drugs of Abuse     Component Value Date/Time   LABOPIA POSITIVE* 07/12/2011 0038   COCAINSCRNUR NONE DETECTED 07/12/2011 0038   LABBENZ NONE DETECTED 07/12/2011 0038   AMPHETMU NONE DETECTED 07/12/2011 0038   THCU NONE DETECTED 07/12/2011 0038   LABBARB NONE DETECTED 07/12/2011 0038    Micro Results: Recent Results (from the past 240 hour(s))  Culture, Blood     Status: None   Collection Time: 12/09/13  4:28 PM  Result Value Ref Range Status   Organism ID, Bacteria NO GROWTH 5 DAYS  Final  Culture, Blood     Status: None   Collection Time: 12/09/13  4:44 PM  Result Value Ref Range Status   Organism ID, Bacteria NO GROWTH 5 DAYS  Final  Anaerobic culture     Status: None   Collection Time: 12/12/13  2:29 AM  Result Value Ref Range Status   Specimen Description ABSCESS RIGHT FOOT  Final   Special Requests NONE  Final   Gram Stain   Final    ABUNDANT WBC PRESENT,BOTH PMN AND MONONUCLEAR NO SQUAMOUS EPITHELIAL CELLS SEEN RARE GRAM POSITIVE COCCI IN PAIRS IN CLUSTERS  Performed at [facility]    Culture   Final    NO ANAEROBES ISOLATED Performed at [facility]    Report Status 12/17/2013 FINAL  Final  Culture, routine-abscess     Status: None   Collection Time: 12/12/13  2:29 AM  Result Value Ref Range Status   Specimen Description ABSCESS RIGHT FOOT  Final   Special Requests NONE  Final   Gram Stain   Final    ABUNDANT WBC PRESENT,BOTH PMN AND MONONUCLEAR NO SQUAMOUS EPITHELIAL CELLS SEEN RARE GRAM POSITIVE COCCI IN PAIRS IN CLUSTERS Performed at [facility]    Culture   Final    FEW METHICILLIN RESISTANT STAPHYLOCOCCUS AUREUS Note: RIFAMPIN AND GENTAMICIN SHOULD NOT BE USED AS SINGLE DRUGS FOR TREATMENT OF STAPH INFECTIONS. This organism DOES NOT demonstrate inducible Clindamycin resistance in vitro. CRITICAL RESULT CALLED TO, READ BACK BY AND VERIFIED  WITH: DARLA T 12/6 @  945 BY REAMM Performed at [facility]    Report Status 12/14/2013 FINAL  Final   Organism ID, Bacteria METHICILLIN RESISTANT STAPHYLOCOCCUS AUREUS  Final      Susceptibility   Methicillin resistant staphylococcus aureus - MIC*    CLINDAMYCIN <=0.25 SENSITIVE Sensitive     ERYTHROMYCIN >=8 RESISTANT Resistant     GENTAMICIN <=0.5 SENSITIVE Sensitive     LEVOFLOXACIN 4 INTERMEDIATE Intermediate     OXACILLIN >=4 RESISTANT Resistant     PENICILLIN >=0.5 RESISTANT Resistant     RIFAMPIN <=0.5 SENSITIVE Sensitive     TRIMETH/SULFA <=10 SENSITIVE Sensitive     VANCOMYCIN 1 SENSITIVE Sensitive     TETRACYCLINE <=1 SENSITIVE Sensitive     * FEW METHICILLIN RESISTANT STAPHYLOCOCCUS AUREUS  Culture, blood (routine x 2)     Status: None (Preliminary result)   Collection Time: 12/12/13  7:03 PM  Result Value Ref Range Status   Specimen Description BLOOD RIGHT ARM  Final   Special Requests BOTTLES DRAWN AEROBIC AND ANAEROBIC 10CC  Final   Culture  Setup Time   Final    12/13/2013 01:04 Performed at [facility]    Culture   Final           BLOOD CULTURE RECEIVED NO GROWTH TO DATE CULTURE WILL BE HELD FOR 5 DAYS BEFORE ISSUING A FINAL NEGATIVE REPORT Performed at [facility]    Report Status PENDING  Incomplete  Culture, blood (routine x 2)     Status: None (Preliminary result)   Collection Time: 12/12/13  7:15 PM  Result Value Ref Range Status   Specimen Description BLOOD LEFT HAND  Final   Special Requests BOTTLES DRAWN AEROBIC AND ANAEROBIC 10CC  Final   Culture  Setup Time   Final    12/13/2013 01:04 Performed at [facility]    Culture   Final           BLOOD CULTURE RECEIVED NO GROWTH TO DATE CULTURE WILL BE HELD FOR 5 DAYS BEFORE ISSUING A FINAL NEGATIVE REPORT Performed at [facility]    Report Status PENDING  Incomplete  Culture, blood (routine x 2)     Status: None (Preliminary result)   Collection Time:  12/13/13  8:47 PM  Result Value Ref Range Status   Specimen Description BLOOD RIGHT ARM  Final   Special Requests BOTTLES DRAWN AEROBIC ONLY 10CC  Final   Culture  Setup Time   Final    12/14/2013 03:54 Performed at [facility]  Culture   Final           BLOOD CULTURE RECEIVED NO GROWTH TO DATE CULTURE WILL BE HELD FOR 5 DAYS BEFORE ISSUING A FINAL NEGATIVE REPORT Performed at [facility]    Report Status PENDING  Incomplete  Culture, blood (routine x 2)     Status: None (Preliminary result)   Collection Time: 12/13/13  9:03 PM  Result Value Ref Range Status   Specimen Description BLOOD RIGHT ARM  Final   Special Requests BOTTLES DRAWN AEROBIC ONLY 2CC  Final   Culture  Setup Time   Final    12/14/2013 03:54 Performed at [facility]    Culture   Final           BLOOD CULTURE RECEIVED NO GROWTH TO DATE CULTURE WILL BE HELD FOR 5 DAYS BEFORE ISSUING A FINAL NEGATIVE REPORT Performed at [facility]    Report Status PENDING  Incomplete  Culture, Urine     Status: None   Collection Time: 12/14/13 12:15 AM  Result Value Ref Range Status   Specimen Description URINE, CLEAN CATCH  Final   Special Requests NONE  Final   Culture  Setup Time   Final    12/14/2013 01:11 Performed at [place] NO GROWTH Performed at [facility]   Final   Culture NO GROWTH Performed at [facility]   Final   Report Status 12/15/2013 FINAL  Final  Surgical pcr screen     Status: Abnormal   Collection Time: 12/15/13 12:00 AM  Result Value Ref Range Status   MRSA, PCR NEGATIVE NEGATIVE Final   Staphylococcus aureus POSITIVE (A) NEGATIVE Final    Comment:        The Xpert SA Assay (FDA approved for NASAL specimens in patients over 20 years of age), is one component of a comprehensive surveillance program.  Test performance has been validated by [employer] for patients greater than or equal to 55 year old. It is  not intended to diagnose infection nor to guide or monitor treatment. RESULT CALLED TO, READ BACK BY AND VERIFIED WITH: C OGIN AJALA,RN 12/15/13 0226 BY RHOLMES   Culture, routine-abscess     Status: None   Collection Time: 12/15/13  1:09 AM  Result Value Ref Range Status   Specimen Description ABSCESS RIGHT FOOT  Final   Special Requests MEDIAL PATIENT ON FOLLOWING ZOSYN VANCOMYCIN  Final   Gram Stain   Final    RARE WBC PRESENT,BOTH PMN AND MONONUCLEAR NO SQUAMOUS EPITHELIAL CELLS SEEN NO ORGANISMS SEEN Performed at [facility]    Culture   Final    NO GROWTH 3 DAYS Performed at [facility]    Report Status 12/18/2013 FINAL  Final  Anaerobic culture     Status: None (Preliminary result)   Collection Time: 12/15/13  1:09 AM  Result Value Ref Range Status   Specimen Description ABSCESS RIGHT FOOT  Final   Special Requests MEDIAL PATIENT ON FOLLOWING ZOSYN VANCOMYCIN  Final   Gram Stain   Final    RARE WBC PRESENT,BOTH PMN AND MONONUCLEAR NO SQUAMOUS EPITHELIAL CELLS SEEN NO ORGANISMS SEEN Performed at [facility]    Culture   Final    NO ANAEROBES ISOLATED; CULTURE IN PROGRESS FOR 5 DAYS Performed at [facility]    Report Status PENDING  Incomplete  Clostridium Difficile by PCR     Status: None  Collection Time: 12/16/13  5:34 PM  Result Value Ref Range Status   C difficile by pcr NEGATIVE NEGATIVE Final   Studies/Results: Dg Chest 2 View  12/16/2013   CLINICAL DATA:  Productive cough x 2 days. SOB "sometimes" says patient. No chest pain. Hx of Right foot surgery 2 days ago. Hx of pulmonary embolism 09/2002, HTN, Type 1 diabetes mellitus, diabetic ulcer of right foot, CKD - stage 3  EXAM: CHEST - 2 VIEW  COMPARISON:  12/13/2013  FINDINGS: Persistent low lung volumes with resultant crowding of bronchovascular structures. No focal infiltrate or overt edema. Heart size upper limits normal for technique, stable. No effusion. Visualized  skeletal structures are unremarkable.  IMPRESSION: 1. Persistent low volumes.  No acute disease.   Electronically Signed   By: Arne Cleveland M.D.   On: 12/16/2013 19:45   Medications: I have reviewed the patient's current medications. Scheduled Meds: . antiseptic oral rinse  7 mL Mouth Rinse q12n4p  . chlorhexidine  15 mL Mouth Rinse BID  . feeding supplement (GLUCERNA SHAKE)  237 mL Oral BID BM  . furosemide  40 mg Oral Daily  . insulin aspart  0-15 Units Subcutaneous TID WC  . insulin aspart protamine- aspart  45 Units Subcutaneous BID WC  . pravastatin  20 mg Oral q1800  . vancomycin  2,000 mg Intravenous Q24H  . Warfarin - Pharmacist Dosing Inpatient   Does not apply q1800   Continuous Infusions:   PRN Meds:.acetaminophen **OR** acetaminophen, dextrose, ondansetron, oxyCODONE-acetaminophen, promethazine, senna-docusate   Assessment/Plan:  Diabetic foot ulcer and abscess, right foot with sepsis: S/p Bedside I&D 12/11/2013.  Wound cx growing few methicillin resistant staph aureus.  S/p repeat I&D 12/14/2013. No organisms seen on repeat wound culture. D/c'd zosyn on day 9. - continue IV Vancomycin (day 10)  - Percocet 5/325mg  1-2 tab q4hr prn pain  Diarrhea, abdominal pain, nausea/vomiting: C diff PCR negative Likely 2/2 antibiotic associated diarrhea.  - continue to monitor  Acute on chronic anemia: Hgb 7.2 from 7.5  - Continue to monitor  Metabolic acidosis with increased anion gap: Likely 2/2 starvation as he has had poor PO intake. Lactic acid wnl.  - UA  Suspected Diabetic ketoacidosis secondary to type 2 diabetes: Resolved.    - SSI mod, CBG q AC/HS  - Novolog 70/30 30u BID  AKI on CKD stage 2: Improving with Cr down to 1.94 from 2.22 (baseline around 1.6)  - continue home lasix 40mg  daily   Hypertension:   - Holding home lisinopril-hydrochlorothiazide.  - Continue home lasix 40mg  daily  FEN:  - carb modified diet  Hx Recurrent DVTs, DVT ppx: SCDs,  warfarin  Dispo: Disposition is deferred at this time, awaiting improvement of current medical problems.  Anticipated discharge in approximately 1-2 day(s).   The patient does have a current PCP Lucious Groves, DO) and does need an [facility] hospital follow-up appointment after discharge.  The patient does not have transportation limitations that hinder transportation to clinic appointments.  .Services Needed at time of discharge: Y = Yes, Blank = No PT: Rolling walker with 5" wheels, 3 in 1  OT: 24hr assistance  RN:   Equipment:   Other:     LOS: 9 days   Jacques Earthly, MD 12/18/2013, 9:38 AM

## 2013-12-18 NOTE — Progress Notes (Signed)
Occupational Therapy Treatment Patient Details Name: [name] MRN: [id number] DOB: 1975/12/06 Today's Date: 12/18/2013    History of present illness 38 year old african american gentleman with type 2 diabetes on insulin, obesity, recurrent DVT on chronic coumadin presenting with a chronic (1 year) non-healing right foot plantar ulcer - non-necrotic, circular, with irregular margins, shallow, tender and warm. The patient has good pedal pulses bilaterally. I examined the patient personally and agree with Dr Trudee Kuster findings. The patients reports systemic symptoms of fever and chills, nausea and vomiting. The patient reports non-compliance with meds including insulin.    OT comments  Pt in bed repeating that "this was a lot to process."  Vague about what he was concerned about, but having to do with his poor family support.  Pt self limiting, but able to move with min assist and use of RW.  Declined ambulation to sink, standing activities at sink and getting up to chair. Instructed in use of post op shoe which is now in room. Each environment he may discharge to have varying set up, tub vs shower and space to accommodate at bariatric 3 in 1.  Will continue to follow to prepare for home.  Follow Up Recommendations  No OT follow up;Supervision/Assistance - 24 hour    Equipment Recommendations       Recommendations for Other Services      Precautions / Restrictions Precautions Precautions: Fall Precaution Comments: R post op shoe Restrictions Other Position/Activity Restrictions: Pt should have post op shoe for wear with mobility       Mobility Bed Mobility  Min assist, HOB up, heavy reliance on rail.                Transfers   Equipment used: Conservation officer, nature (2 wheeled) Transfers: Risk manager;Sit to/from Stand Sit to Stand: Min assist Stand pivot transfers: Min assist       General transfer comment: verbal cues for hand placement and to power up, used  momentum    Balance                                   ADL Overall ADL's : Needs assistance/impaired     Grooming: Wash/dry hands;Wash/dry face;Set up;Sitting           Upper Body Dressing : Supervision/safety;Bed level   Lower Body Dressing: Maximal assistance;Sitting/lateral leans (post op shoe)   Toilet Transfer: Minimal assistance;Stand-pivot;BSC;Requires wide/bariatric           Functional mobility during ADLs: Minimal assistance;Rolling walker General ADL Comments: Pt with flat affect, requiring encouragement to work with OT.  Still not decided about what environment he will go upon d/c.      Vision                     Perception     Praxis      Cognition   Behavior During Therapy: Anxious;Flat affect Overall Cognitive Status: Within Functional Limits for tasks assessed                       Extremity/Trunk Assessment               Exercises     Shoulder Instructions       General Comments      Pertinent Vitals/ Pain       Pain Assessment: Faces Faces Pain Scale: Hurts  little more Pain Location: R foot Pain Descriptors / Indicators: Aching Pain Intervention(s): Repositioned;Premedicated before session;Monitored during session  Home Living                                          Prior Functioning/Environment              Frequency Min 2X/week     Progress Toward Goals  OT Goals(current goals can now be found in the care plan section)  Progress towards OT goals: Not progressing toward goals - comment (pt declining standing activities and ambulation)     Plan Discharge plan remains appropriate    Co-evaluation                 End of Session Equipment Utilized During Treatment: Rolling walker   Activity Tolerance Patient tolerated treatment well   Patient Left in bed;with call bell/phone within reach   Nurse Communication          Time: [id number] OT Time  Calculation (min): 20 min  Charges: OT General Charges $OT Visit: 1 Procedure OT Treatments $Self Care/Home Management : 8-22 mins  Malka So 12/18/2013, 11:01 AM  [phone]

## 2013-12-18 NOTE — Progress Notes (Signed)
[facility] for Warfarin Indication: recurrent DVTs  No Known Allergies  Patient Measurements: Height: 6\' 2"  (188 cm) Weight: (!) 365 lb 6.4 oz (165.744 kg) IBW/kg (Calculated) : 82.2  Vital Signs: Temp: 99.1 F (37.3 C) (12/10 1002) Temp Source: Oral (12/10 1002) BP: 155/80 mmHg (12/10 1002) Pulse Rate: 111 (12/10 1002)  Labs:  Recent Labs  12/16/13 0620 12/17/13 0340 12/18/13 0428  HGB 8.1* 7.5* 7.2*  HCT 25.2* 23.9* 22.9*  PLT 543* 579* 572*  LABPROT 37.6* 42.1* 45.7*  INR 3.78* 4.38* 4.85*  CREATININE 2.16* 2.22* 1.94*    Estimated Creatinine Clearance: 85.2 mL/min (by C-G formula based on Cr of 1.94).  Assessment: 38yom admitted for cellulitis.    AC: Has h/o recurrent DVTs. He is supposed to be on warfarin as outpt but is non-compliant. INR 1.28 on admission. INR trending back up to 4.85 after small dose given 12/8 (supratherapeutic INR peaked 12/7 at 6.23). Appears pt is sensitive to coumadin at this time. Hgb 7.2 (was supposed to receive PRBC x 1 12/7 but after 15 min, pt experienced temp and some chills so PRBC taken down). Plt high.  Home dose: Per outpatient note last home regimen warfarin 7.5mg  daily and 10mg  Mon and Thur.   Goal of Therapy:  INR 2-3  Plan:  - No coumadin tonight - Daily INR  Sherlon Handing, PharmD, BCPS Clinical pharmacist, pager [phone] 12/18/2013 11:18 AM

## 2013-12-19 ENCOUNTER — Inpatient Hospital Stay (HOSPITAL_COMMUNITY): Payer: Medicaid Other

## 2013-12-19 DIAGNOSIS — L03115 Cellulitis of right lower limb: Secondary | ICD-10-CM

## 2013-12-19 DIAGNOSIS — E1310 Other specified diabetes mellitus with ketoacidosis without coma: Secondary | ICD-10-CM

## 2013-12-19 DIAGNOSIS — R112 Nausea with vomiting, unspecified: Secondary | ICD-10-CM

## 2013-12-19 LAB — BASIC METABOLIC PANEL
Anion gap: 18 — ABNORMAL HIGH (ref 5–15)
BUN: 36 mg/dL — AB (ref 6–23)
CALCIUM: 8.7 mg/dL (ref 8.4–10.5)
CO2: 16 meq/L — AB (ref 19–32)
Chloride: 103 mEq/L (ref 96–112)
Creatinine, Ser: 1.98 mg/dL — ABNORMAL HIGH (ref 0.50–1.35)
GFR calc Af Amer: 48 mL/min — ABNORMAL LOW (ref >90)
GFR, EST NON AFRICAN AMERICAN: 41 mL/min — AB (ref >90)
Glucose, Bld: 80 mg/dL (ref 70–99)
Potassium: 4.3 mEq/L (ref 3.7–5.3)
SODIUM: 137 meq/L (ref 137–147)

## 2013-12-19 LAB — CBC WITH DIFFERENTIAL/PLATELET
Basophils Absolute: 0 10*3/uL (ref 0.0–0.1)
Basophils Relative: 0 % (ref 0–1)
EOS ABS: 0.3 10*3/uL (ref 0.0–0.7)
EOS PCT: 1 % (ref 0–5)
HEMATOCRIT: 23.5 % — AB (ref 39.0–52.0)
HEMOGLOBIN: 7 g/dL — AB (ref 13.0–17.0)
Lymphocytes Relative: 8 % — ABNORMAL LOW (ref 12–46)
Lymphs Abs: 2.2 10*3/uL (ref 0.7–4.0)
MCH: 26 pg (ref 26.0–34.0)
MCHC: 29.8 g/dL — AB (ref 30.0–36.0)
MCV: 87.4 fL (ref 78.0–100.0)
MONO ABS: 2.2 10*3/uL — AB (ref 0.1–1.0)
Monocytes Relative: 8 % (ref 3–12)
NEUTROS ABS: 22.2 10*3/uL — AB (ref 1.7–7.7)
Neutrophils Relative %: 83 % — ABNORMAL HIGH (ref 43–77)
Platelets: 635 10*3/uL — ABNORMAL HIGH (ref 150–400)
RBC: 2.69 MIL/uL — AB (ref 4.22–5.81)
RDW: 17.6 % — ABNORMAL HIGH (ref 11.5–15.5)
WBC: 26.9 10*3/uL — ABNORMAL HIGH (ref 4.0–10.5)

## 2013-12-19 LAB — GLUCOSE, CAPILLARY
GLUCOSE-CAPILLARY: 161 mg/dL — AB (ref 70–99)
Glucose-Capillary: 101 mg/dL — ABNORMAL HIGH (ref 70–99)
Glucose-Capillary: 125 mg/dL — ABNORMAL HIGH (ref 70–99)
Glucose-Capillary: 181 mg/dL — ABNORMAL HIGH (ref 70–99)

## 2013-12-19 LAB — CULTURE, BLOOD (ROUTINE X 2)
CULTURE: NO GROWTH
CULTURE: NO GROWTH

## 2013-12-19 LAB — PROTIME-INR
INR: 5.55 (ref 0.00–1.49)
Prothrombin Time: 50.7 seconds — ABNORMAL HIGH (ref 11.6–15.2)

## 2013-12-19 MED ORDER — HYDROMORPHONE HCL 1 MG/ML IJ SOLN
0.5000 mg | INTRAMUSCULAR | Status: DC | PRN
  Administered 2013-12-20: 0.5 mg via INTRAVENOUS
  Filled 2013-12-19 (×2): qty 1

## 2013-12-19 MED ORDER — LABETALOL HCL 5 MG/ML IV SOLN
5.0000 mg | INTRAVENOUS | Status: DC | PRN
  Filled 2013-12-19: qty 4

## 2013-12-19 MED ORDER — IOHEXOL 300 MG/ML  SOLN
25.0000 mL | INTRAMUSCULAR | Status: AC
  Administered 2013-12-19 (×2): 25 mL via ORAL

## 2013-12-19 MED ORDER — HYDROMORPHONE HCL 1 MG/ML IJ SOLN
1.0000 mg | Freq: Once | INTRAMUSCULAR | Status: AC
  Administered 2013-12-19: 1 mg via INTRAVENOUS
  Filled 2013-12-19: qty 1

## 2013-12-19 MED ORDER — ONDANSETRON HCL 4 MG/2ML IJ SOLN
4.0000 mg | Freq: Four times a day (QID) | INTRAMUSCULAR | Status: DC | PRN
  Administered 2013-12-20 – 2013-12-22 (×4): 4 mg via INTRAVENOUS
  Filled 2013-12-19 (×5): qty 2

## 2013-12-19 NOTE — Progress Notes (Signed)
PT Cancellation Note  Patient Details Name: [name] MRN: [id number] DOB: 10-May-1975   Cancelled Treatment:    Reason Eval/Treat Not Completed: Medical issues which prohibited therapy . Patient Hgb is 7.0 and INR is 5.5. Will hold PT today based on department protocol  Vira Chaplin, Tonia Brooms 12/19/2013, 1:20 PM

## 2013-12-19 NOTE — Progress Notes (Signed)
Subjective: Nausea and vomiting last night. Reports nausea this morning. Denies cheat pain or shortness of breath. No diarrhea. Abdominal pain has improved. He has area of R hip soreness that he reports has been there for the past week and started as "pimple." Denies drainage.  Objective: Vital signs in last 24 hours: Filed Vitals:   12/19/13 0249 12/19/13 0618 12/19/13 0900 12/19/13 1315  BP: 147/75 169/90 171/90 151/71  Pulse: 104 105 107 111  Temp: 98.2 F (36.8 C) 98.9 F (37.2 C) 98.9 F (37.2 C) 99 F (37.2 C)  TempSrc: Oral Oral Oral Oral  Resp: 18 16 16 16   Height:      Weight:      SpO2: 99% 98% 98% 92%   Weight change:  No intake or output data in the 24 hours ending 12/19/13 1409 General: NAD HEENT: [place]/AT Cardiac: RRR, no rubs, murmurs or gallops Pulm: clear to auscultation bilaterally, moving normal volumes of air Abd: soft, + BS, nondistended, nontender Ext: +1 B/L LE edema, Dressing in place on R foot, minimally TTP, 5cm area of induration on R posterior hip with warmth. No drainage. Neuro: alert and oriented X3, responding appropriately, follows commands, able to sit up for lung exam  Lab Results: Basic Metabolic Panel:  Recent Labs Lab 12/18/13 0428 12/19/13 0421  NA 136* 137  K 3.9 4.3  CL 103 103  CO2 17* 16*  GLUCOSE 90 80  BUN 38* 36*  CREATININE 1.94* 1.98*  CALCIUM 8.7 8.7   Liver Function Tests:  Recent Labs Lab 12/14/13 0738 12/15/13 1322  AST 23 24  ALT 23 26  ALKPHOS 224* 261*  BILITOT 1.6* 1.2  PROT 7.2 7.9  ALBUMIN 1.9* 1.9*   CBC:  Recent Labs Lab 12/18/13 0428 12/19/13 0421  WBC 27.0* 26.9*  NEUTROABS 22.4* 22.2*  HGB 7.2* 7.0*  HCT 22.9* 23.5*  MCV 85.1 87.4  PLT 572* 635*   CBG:  Recent Labs Lab 12/18/13 0749 12/18/13 1202 12/18/13 1725 12/18/13 2152 12/19/13 0751 12/19/13 1229  GLUCAP 122* 72 61* 77 101* 125*   Coagulation:  Recent Labs Lab 12/16/13 0620 12/17/13 0340 12/18/13 0428  12/19/13 0421  LABPROT 37.6* 42.1* 45.7* 50.7*  INR 3.78* 4.38* 4.85* 5.55*   Urine Drug Screen: Drugs of Abuse     Component Value Date/Time   LABOPIA POSITIVE* 07/12/2011 0038   COCAINSCRNUR NONE DETECTED 07/12/2011 0038   LABBENZ NONE DETECTED 07/12/2011 0038   AMPHETMU NONE DETECTED 07/12/2011 0038   THCU NONE DETECTED 07/12/2011 0038   LABBARB NONE DETECTED 07/12/2011 0038    Micro Results: Recent Results (from the past 240 hour(s))  Culture, Blood     Status: None   Collection Time: 12/09/13  4:28 PM  Result Value Ref Range Status   Organism ID, Bacteria NO GROWTH 5 DAYS  Final  Culture, Blood     Status: None   Collection Time: 12/09/13  4:44 PM  Result Value Ref Range Status   Organism ID, Bacteria NO GROWTH 5 DAYS  Final  Anaerobic culture     Status: None   Collection Time: 12/12/13  2:29 AM  Result Value Ref Range Status   Specimen Description ABSCESS RIGHT FOOT  Final   Special Requests NONE  Final   Gram Stain   Final    ABUNDANT WBC PRESENT,BOTH PMN AND MONONUCLEAR NO SQUAMOUS EPITHELIAL CELLS SEEN RARE GRAM POSITIVE COCCI IN PAIRS IN CLUSTERS Performed at [facility]  Final    NO ANAEROBES ISOLATED Performed at [facility]    Report Status 12/17/2013 FINAL  Final  Culture, routine-abscess     Status: None   Collection Time: 12/12/13  2:29 AM  Result Value Ref Range Status   Specimen Description ABSCESS RIGHT FOOT  Final   Special Requests NONE  Final   Gram Stain   Final    ABUNDANT WBC PRESENT,BOTH PMN AND MONONUCLEAR NO SQUAMOUS EPITHELIAL CELLS SEEN RARE GRAM POSITIVE COCCI IN PAIRS IN CLUSTERS Performed at [facility]    Culture   Final    FEW METHICILLIN RESISTANT STAPHYLOCOCCUS AUREUS Note: RIFAMPIN AND GENTAMICIN SHOULD NOT BE USED AS SINGLE DRUGS FOR TREATMENT OF STAPH INFECTIONS. This organism DOES NOT demonstrate inducible Clindamycin resistance in vitro. CRITICAL RESULT CALLED TO, READ BACK BY  AND VERIFIED WITH: DARLA T 12/6 @  945 BY REAMM Performed at [facility]    Report Status 12/14/2013 FINAL  Final   Organism ID, Bacteria METHICILLIN RESISTANT STAPHYLOCOCCUS AUREUS  Final      Susceptibility   Methicillin resistant staphylococcus aureus - MIC*    CLINDAMYCIN <=0.25 SENSITIVE Sensitive     ERYTHROMYCIN >=8 RESISTANT Resistant     GENTAMICIN <=0.5 SENSITIVE Sensitive     LEVOFLOXACIN 4 INTERMEDIATE Intermediate     OXACILLIN >=4 RESISTANT Resistant     PENICILLIN >=0.5 RESISTANT Resistant     RIFAMPIN <=0.5 SENSITIVE Sensitive     TRIMETH/SULFA <=10 SENSITIVE Sensitive     VANCOMYCIN 1 SENSITIVE Sensitive     TETRACYCLINE <=1 SENSITIVE Sensitive     * FEW METHICILLIN RESISTANT STAPHYLOCOCCUS AUREUS  Culture, blood (routine x 2)     Status: None   Collection Time: 12/12/13  7:03 PM  Result Value Ref Range Status   Specimen Description BLOOD RIGHT ARM  Final   Special Requests BOTTLES DRAWN AEROBIC AND ANAEROBIC 10CC  Final   Culture  Setup Time   Final    12/13/2013 01:04 Performed at [facility]    Culture   Final    NO GROWTH 5 DAYS Performed at [facility]    Report Status 12/19/2013 FINAL  Final  Culture, blood (routine x 2)     Status: None   Collection Time: 12/12/13  7:15 PM  Result Value Ref Range Status   Specimen Description BLOOD LEFT HAND  Final   Special Requests BOTTLES DRAWN AEROBIC AND ANAEROBIC 10CC  Final   Culture  Setup Time   Final    12/13/2013 01:04 Performed at [facility]    Culture   Final    NO GROWTH 5 DAYS Performed at [facility]    Report Status 12/19/2013 FINAL  Final  Culture, blood (routine x 2)     Status: None (Preliminary result)   Collection Time: 12/13/13  8:47 PM  Result Value Ref Range Status   Specimen Description BLOOD RIGHT ARM  Final   Special Requests BOTTLES DRAWN AEROBIC ONLY 10CC  Final   Culture  Setup Time   Final    12/14/2013 03:54 Performed at [facility]    Culture   Final           BLOOD CULTURE RECEIVED NO GROWTH TO DATE CULTURE WILL BE HELD FOR 5 DAYS BEFORE ISSUING A FINAL NEGATIVE REPORT Performed at [facility]    Report Status PENDING  Incomplete  Culture, blood (routine x 2)     Status: None (  Preliminary result)   Collection Time: 12/13/13  9:03 PM  Result Value Ref Range Status   Specimen Description BLOOD RIGHT ARM  Final   Special Requests BOTTLES DRAWN AEROBIC ONLY 2CC  Final   Culture  Setup Time   Final    12/14/2013 03:54 Performed at [facility]    Culture   Final           BLOOD CULTURE RECEIVED NO GROWTH TO DATE CULTURE WILL BE HELD FOR 5 DAYS BEFORE ISSUING A FINAL NEGATIVE REPORT Performed at [facility]    Report Status PENDING  Incomplete  Culture, Urine     Status: None   Collection Time: 12/14/13 12:15 AM  Result Value Ref Range Status   Specimen Description URINE, CLEAN CATCH  Final   Special Requests NONE  Final   Culture  Setup Time   Final    12/14/2013 01:11 Performed at [facility] Performed at [facility]   Final   Culture NO GROWTH Performed at [facility]   Final   Report Status 12/15/2013 FINAL  Final  Surgical pcr screen     Status: Abnormal   Collection Time: 12/15/13 12:00 AM  Result Value Ref Range Status   MRSA, PCR NEGATIVE NEGATIVE Final   Staphylococcus aureus POSITIVE (A) NEGATIVE Final    Comment:        The Xpert SA Assay (FDA approved for NASAL specimens in patients over 50 years of age), is one component of a comprehensive surveillance program.  Test performance has been validated by [employer] for patients greater than or equal to 66 year old. It is not intended to diagnose infection nor to guide or monitor treatment. RESULT CALLED TO, READ BACK BY AND VERIFIED WITH: C OGIN AJALA,RN 12/15/13 0226 BY RHOLMES   Culture, routine-abscess     Status: None   Collection Time:  12/15/13  1:09 AM  Result Value Ref Range Status   Specimen Description ABSCESS RIGHT FOOT  Final   Special Requests MEDIAL PATIENT ON FOLLOWING ZOSYN VANCOMYCIN  Final   Gram Stain   Final    RARE WBC PRESENT,BOTH PMN AND MONONUCLEAR NO SQUAMOUS EPITHELIAL CELLS SEEN NO ORGANISMS SEEN Performed at [facility]    Culture   Final    NO GROWTH 3 DAYS Performed at [facility]    Report Status 12/18/2013 FINAL  Final  Anaerobic culture     Status: None (Preliminary result)   Collection Time: 12/15/13  1:09 AM  Result Value Ref Range Status   Specimen Description ABSCESS RIGHT FOOT  Final   Special Requests MEDIAL PATIENT ON FOLLOWING ZOSYN VANCOMYCIN  Final   Gram Stain   Final    RARE WBC PRESENT,BOTH PMN AND MONONUCLEAR NO SQUAMOUS EPITHELIAL CELLS SEEN NO ORGANISMS SEEN Performed at [facility]    Culture   Final    NO ANAEROBES ISOLATED; CULTURE IN PROGRESS FOR 5 DAYS Performed at [facility]    Report Status PENDING  Incomplete  Clostridium Difficile by PCR     Status: None   Collection Time: 12/16/13  5:34 PM  Result Value Ref Range Status   C difficile by pcr NEGATIVE NEGATIVE Final   Studies/Results: No results found. Medications: I have reviewed the patient's current medications. Scheduled Meds: . antiseptic oral rinse  7 mL Mouth Rinse q12n4p  . chlorhexidine  15 mL Mouth Rinse BID  .  feeding supplement (GLUCERNA SHAKE)  237 mL Oral BID BM  . furosemide  40 mg Oral Daily  . insulin aspart  0-15 Units Subcutaneous TID WC  . insulin aspart protamine- aspart  30 Units Subcutaneous BID WC  . pravastatin  20 mg Oral q1800  . vancomycin  2,000 mg Intravenous Q24H  . Warfarin - Pharmacist Dosing Inpatient   Does not apply q1800   Continuous Infusions:   PRN Meds:.acetaminophen **OR** acetaminophen, dextrose, HYDROmorphone (DILAUDID) injection, menthol-cetylpyridinium, ondansetron, oxyCODONE-acetaminophen, promethazine,  senna-docusate   Assessment/Plan:  R posterior hip cellulitis: may have underlying abscess  - [place] pending  - May need incision and drainage  Diabetic foot ulcer and abscess, right foot with sepsis: S/p Bedside I&D 12/11/2013.  Wound cx growing few methicillin resistant staph aureus.  S/p repeat I&D 12/14/2013. No organisms seen on repeat wound culture. D/c'd zosyn on day 9. - continue IV Vancomycin (day 11)  - Percocet 5/325mg  1-2 tab q4hr prn pain  - IV dilaudid 0.5mg  Q4hr prn   Diarrhea, abdominal pain, nausea/vomiting: C diff PCR negative Likely 2/2 antibiotic associated diarrhea.  - continue to monitor  - IV phenergan 25mg  Q6hr prn  Acute on chronic anemia: Anemia of chronic disease by iron studies. Hgb 7.0 from 7.2  - Continue to monitor  Metabolic acidosis with increased anion gap: Likely 2/2 starvation as he has had poor PO intake. Lactic acid wnl. No Ketones on UA  - Continue to monitor  Suspected Diabetic ketoacidosis secondary to type 2 diabetes: Resolved.    - SSI mod, CBG q AC/HS  - d/c Novolog 70/30 30u BID while poor PO intake  AKI on CKD stage 2: Improving with Cr 1.98 from 1.94(baseline around 1.6)  - continue home lasix 40mg  daily   Hypertension:   - Holding home lisinopril-hydrochlorothiazide.  - Continue home lasix 40mg  daily  FEN:  - carb modified diet  Hx Recurrent DVTs, DVT ppx: SCDs, warfarin  Dispo: Disposition is deferred at this time, awaiting improvement of current medical problems.  Anticipated discharge in approximately 1-2 day(s).   The patient does have a current PCP Lucious Groves, DO) and does need an [facility] hospital follow-up appointment after discharge.  The patient does not have transportation limitations that hinder transportation to clinic appointments.  .Services Needed at time of discharge: Y = Yes, Blank = No PT: Rolling walker with 5" wheels, 3 in 1  OT: 24hr assistance  RN:   Equipment:   Other:     LOS: 10 days   Jacques Earthly, MD 12/19/2013, 2:09 PM

## 2013-12-19 NOTE — Progress Notes (Signed)
NUTRITION FOLLOW UP  Intervention:    Continue Glucerna Shake PO BID, each supplement provides 220 kcal and 10 grams of protein  Consider starting bowel regimen (Senokot is ordered prn) last bowel movement documented on 12/8.  Nutrition Dx:   Increased nutrient needs related to wound healing as evidenced by diabetic ulcer on foot; ongoing  Goal:   Pt to meet >/= 90% of their estimated nutrition needs, progressing.  Monitor:   Weight trend, po intake, acceptance of supplements, labs  Assessment:   38 year old african [place] gentleman with type 2 diabetes on insulin, obesity, recurrent DVT on chronic coumadin presenting with a chronic (1 year) non-healing right foot plantar ulcer.   S/p I&D of rt diabetic foot abscess and surgical debridement on 12/15/13.   Intake remains poor with ongoing nausea. Patient states that he is not drinking the Glucerna Shakes because he is nauseous. He prefers the Glucerna to be very cold.   Last bowel movement documented on 12/8, may need to start a bowel regimen. Has Senokot ordered.  Height: Ht Readings from Last 1 Encounters:  12/10/13 6\' 2"  (1.88 m)    Weight Status:   Wt Readings from Last 1 Encounters:  12/15/13 365 lb 6.4 oz (165.744 kg)   12/10/13 323 lb 4.8 oz (146.648 kg)   Re-estimated needs:  Kcal: 2400-2600 Protein: 128-138 grams Fluid: 2.4-2.6 L  Skin: diabetic ulcer on rt foot, rt foot surgical incision  Diet Order: Diet Carb Modified  No intake or output data in the 24 hours ending 12/19/13 1417  Last BM: 12/8   Labs:   Recent Labs Lab 12/17/13 0340 12/18/13 0428 12/19/13 0421  NA 139 136* 137  K 4.3 3.9 4.3  CL 105 103 103  CO2 19 17* 16*  BUN 45* 38* 36*  CREATININE 2.22* 1.94* 1.98*  CALCIUM 8.8 8.7 8.7  GLUCOSE 106* 90 80    CBG (last 3)   Recent Labs  12/18/13 2152 12/19/13 0751 12/19/13 1229  GLUCAP 77 101* 125*   Lab Results  Component Value Date   HGBA1C 12.8 08/14/2013    Scheduled Meds: . antiseptic oral rinse  7 mL Mouth Rinse q12n4p  . chlorhexidine  15 mL Mouth Rinse BID  . feeding supplement (GLUCERNA SHAKE)  237 mL Oral BID BM  . furosemide  40 mg Oral Daily  . insulin aspart  0-15 Units Subcutaneous TID WC  . insulin aspart protamine- aspart  30 Units Subcutaneous BID WC  . pravastatin  20 mg Oral q1800  . vancomycin  2,000 mg Intravenous Q24H  . Warfarin - Pharmacist Dosing Inpatient   Does not apply q1800    Continuous Infusions:     Molli Barrows, RD, LDN, [place] Pager [phone] After Hours Pager [phone]

## 2013-12-19 NOTE — Progress Notes (Signed)
ANTICOAGULATION and ANTIBIOTIC CONSULT NOTE  Pharmacy Consult for Warfarin and vancomycin Indication: recurrent DVTs, MRSA heel cellulitis  No Known Allergies  Patient Measurements: Height: 6' 2"  (188 cm) Weight: (!) 365 lb 6.4 oz (165.744 kg) IBW/kg (Calculated) : 82.2  Vital Signs: Temp: 98.9 F (37.2 C) (12/11 0900) Temp Source: Oral (12/11 0900) BP: 171/90 mmHg (12/11 0900) Pulse Rate: 107 (12/11 0900)  Labs:  Recent Labs  12/17/13 0340 12/18/13 0428 12/19/13 0421  HGB 7.5* 7.2* 7.0*  HCT 23.9* 22.9* 23.5*  PLT 579* 572* 635*  LABPROT 42.1* 45.7* 50.7*  INR 4.38* 4.85* 5.55*  CREATININE 2.22* 1.94* 1.98*    Estimated Creatinine Clearance: 83.5 mL/min (by C-G formula based on Cr of 1.98).  Assessment: 38yom admitted for cellulitis.    Has h/o recurrent DVTs. He is supposed to be on warfarin as outpt but is non-compliant. Per outpatient note last home regimen warfarin 7.61m daily and 129mMon and Thur. INR 1.28 on admission, received doses on 12/4 (7.1m31m 12/5 (1mg60mnd INR trended up to 6.23. Then received 2mg 71m12/8 and INR continues to increase- this morning INR is 5.55. Appears pt is sensitive to Coumadin at this time. Last LFTs from 12/7 were mostly normal except for elevated alk phos. Hgb 7(was supposed to receive PRBC x 1 12/7 but after 15 min, pt experienced temp and some chills so PRBC taken down). Plts elevated. No overt bleeding noted.  Patient continues on vancomycin for heel cellulitis. No osteo seen on MRI but patient does have abscess. SCr trending down- today 1.98. WBC 26.  Goal of Therapy:  INR 2-3  Vancomycin trough 15-20mcg29m Plan:  - No coumadin tonight - Daily INR - will get repeat LFTs tomorrow - continue vancomycin 2g IV q24h - follow c/s, clinical progression, renal function, likely to repeat trough on 12/14  Satvik Parco D. Isak Sotomayor, PharmD, BCPS Clinical Pharmacist Pager: [phone]/2015 11:02 AM

## 2013-12-19 NOTE — Progress Notes (Signed)
CRITICAL VALUE ALERT  Critical value received:  INR 5.55  Date of notification:  12-19-13  Time of notification:  0630  Critical value read back: Yes  Nurse who received alert:  Gillis Ends RN  MD notified (1st page): Dr. Arcelia Jew  Time of first page:  [phone]  MD notified (2nd page):  Time of second page:  Responding MD:  Dr. Arcelia Jew  Time MD responded:  [phone]

## 2013-12-19 NOTE — Progress Notes (Signed)
Subjective: 5 Days Post-Op Procedure(s) (LRB): IRRIGATION AND DEBRIDEMENT RIGHT FOOT (Right) Patient reports pain as mild.  Some nausea overnight but feeling ok this morning.  I changed the dressing and repacked the wounds last night.  Objective: Vital signs in last 24 hours: Temp:  [98.2 F (36.8 C)-99.7 F (37.6 C)] 98.9 F (37.2 C) (12/11 0618) Pulse Rate:  [104-112] 105 (12/11 0618) Resp:  [16-20] 16 (12/11 0618) BP: (143-169)/(71-90) 169/90 mmHg (12/11 0618) SpO2:  [96 %-100 %] 98 % (12/11 0618)  Intake/Output from previous day: 12/10 0701 - 12/11 0700 In: 240 [P.O.:240] Out: 900 [Urine:900] Intake/Output this shift:     Recent Labs  12/17/13 0340 12/18/13 0428 12/19/13 0421  HGB 7.5* 7.2* 7.0*    Recent Labs  12/18/13 0428 12/19/13 0421  WBC 27.0* 26.9*  RBC 2.69* 2.69*  HCT 22.9* 23.5*  PLT 572* 635*    Recent Labs  12/18/13 0428 12/19/13 0421  NA 136* 137  K 3.9 4.3  CL 103 103  CO2 17* 16*  BUN 38* 36*  CREATININE 1.94* 1.98*  GLUCOSE 90 80  CALCIUM 8.7 8.7    Recent Labs  12/18/13 0428 12/19/13 0421  INR 4.85* 5.55*    PE:  obese male in nad.  R foot dressed and dry.  Last night the foot packing had serosang drainage but no purulence.  Foot is swollen and slightly warm.  No significant erythema or tenderness.  No fluctuance.  Assessment/Plan: 5 Days Post-Op Procedure(s) (LRB): IRRIGATION AND DEBRIDEMENT RIGHT FOOT (Right)  At this point, his appears to be adequately drained.  Recommend continuing IV abx and observing for improvement in WBC.  He can bear weight as tolerated.  I'll see him again Sunday morning if he's still here.  [name] 12/19/2013, 7:43 AM

## 2013-12-20 ENCOUNTER — Inpatient Hospital Stay (HOSPITAL_COMMUNITY): Payer: Medicaid Other

## 2013-12-20 DIAGNOSIS — L02415 Cutaneous abscess of right lower limb: Secondary | ICD-10-CM | POA: Diagnosis not present

## 2013-12-20 DIAGNOSIS — D72829 Elevated white blood cell count, unspecified: Secondary | ICD-10-CM

## 2013-12-20 LAB — ANAEROBIC CULTURE

## 2013-12-20 LAB — COMPREHENSIVE METABOLIC PANEL
ALT: 21 U/L (ref 0–53)
ANION GAP: 23 — AB (ref 5–15)
AST: 18 U/L (ref 0–37)
Albumin: 1.7 g/dL — ABNORMAL LOW (ref 3.5–5.2)
Alkaline Phosphatase: 223 U/L — ABNORMAL HIGH (ref 39–117)
BUN: 46 mg/dL — ABNORMAL HIGH (ref 6–23)
CALCIUM: 8.8 mg/dL (ref 8.4–10.5)
CO2: 12 meq/L — AB (ref 19–32)
CREATININE: 2.3 mg/dL — AB (ref 0.50–1.35)
Chloride: 103 mEq/L (ref 96–112)
GFR calc Af Amer: 40 mL/min — ABNORMAL LOW (ref >90)
GFR, EST NON AFRICAN AMERICAN: 35 mL/min — AB (ref >90)
GLUCOSE: 189 mg/dL — AB (ref 70–99)
Potassium: 5.1 mEq/L (ref 3.7–5.3)
Sodium: 138 mEq/L (ref 137–147)
Total Bilirubin: 0.6 mg/dL (ref 0.3–1.2)
Total Protein: 7.8 g/dL (ref 6.0–8.3)

## 2013-12-20 LAB — CBC
HEMATOCRIT: 25.5 % — AB (ref 39.0–52.0)
Hemoglobin: 8.1 g/dL — ABNORMAL LOW (ref 13.0–17.0)
MCH: 27.3 pg (ref 26.0–34.0)
MCHC: 31.8 g/dL (ref 30.0–36.0)
MCV: 85.9 fL (ref 78.0–100.0)
PLATELETS: 587 10*3/uL — AB (ref 150–400)
RBC: 2.97 MIL/uL — AB (ref 4.22–5.81)
RDW: 16.3 % — ABNORMAL HIGH (ref 11.5–15.5)
WBC: 24.3 10*3/uL — ABNORMAL HIGH (ref 4.0–10.5)

## 2013-12-20 LAB — CBC WITH DIFFERENTIAL/PLATELET
Basophils Absolute: 0 10*3/uL (ref 0.0–0.1)
Basophils Relative: 0 % (ref 0–1)
Eosinophils Absolute: 0 10*3/uL (ref 0.0–0.7)
Eosinophils Relative: 0 % (ref 0–5)
HCT: 22.3 % — ABNORMAL LOW (ref 39.0–52.0)
Hemoglobin: 6.9 g/dL — CL (ref 13.0–17.0)
Lymphocytes Relative: 8 % — ABNORMAL LOW (ref 12–46)
Lymphs Abs: 2 10*3/uL (ref 0.7–4.0)
MCH: 27.9 pg (ref 26.0–34.0)
MCHC: 30.9 g/dL (ref 30.0–36.0)
MCV: 90.3 fL (ref 78.0–100.0)
MONO ABS: 2.3 10*3/uL — AB (ref 0.1–1.0)
MONOS PCT: 9 % (ref 3–12)
NEUTROS ABS: 21.2 10*3/uL — AB (ref 1.7–7.7)
Neutrophils Relative %: 83 % — ABNORMAL HIGH (ref 43–77)
Platelets: 624 10*3/uL — ABNORMAL HIGH (ref 150–400)
RBC: 2.47 MIL/uL — ABNORMAL LOW (ref 4.22–5.81)
RDW: 17.5 % — AB (ref 11.5–15.5)
WBC: 25.5 10*3/uL — AB (ref 4.0–10.5)

## 2013-12-20 LAB — CULTURE, BLOOD (ROUTINE X 2)
CULTURE: NO GROWTH
Culture: NO GROWTH

## 2013-12-20 LAB — GAMMA GT: GGT: 252 U/L — ABNORMAL HIGH (ref 7–51)

## 2013-12-20 LAB — PROTIME-INR
INR: 6.45 (ref 0.00–1.49)
Prothrombin Time: 57.1 seconds — ABNORMAL HIGH (ref 11.6–15.2)

## 2013-12-20 LAB — GLUCOSE, CAPILLARY
GLUCOSE-CAPILLARY: 188 mg/dL — AB (ref 70–99)
Glucose-Capillary: 201 mg/dL — ABNORMAL HIGH (ref 70–99)
Glucose-Capillary: 237 mg/dL — ABNORMAL HIGH (ref 70–99)
Glucose-Capillary: 238 mg/dL — ABNORMAL HIGH (ref 70–99)

## 2013-12-20 LAB — LIPASE, BLOOD: Lipase: 60 U/L — ABNORMAL HIGH (ref 11–59)

## 2013-12-20 LAB — PREPARE RBC (CROSSMATCH)

## 2013-12-20 MED ORDER — INSULIN GLARGINE 100 UNIT/ML ~~LOC~~ SOLN
15.0000 [IU] | Freq: Every day | SUBCUTANEOUS | Status: DC
  Administered 2013-12-20 (×2): 15 [IU] via SUBCUTANEOUS
  Filled 2013-12-20 (×2): qty 0.15

## 2013-12-20 MED ORDER — FUROSEMIDE 10 MG/ML IJ SOLN
20.0000 mg | Freq: Every day | INTRAMUSCULAR | Status: DC
  Administered 2013-12-21: 20 mg via INTRAVENOUS
  Filled 2013-12-20: qty 2

## 2013-12-20 MED ORDER — DEXTROSE-NACL 5-0.45 % IV SOLN
INTRAVENOUS | Status: DC
  Administered 2013-12-20: 10:00:00 via INTRAVENOUS
  Administered 2013-12-21: 75 mL via INTRAVENOUS
  Administered 2013-12-22: 04:00:00 via INTRAVENOUS

## 2013-12-20 MED ORDER — FUROSEMIDE 10 MG/ML IJ SOLN: 20.0000 mg | Freq: Every day | INTRAMUSCULAR | Status: DC

## 2013-12-20 MED ORDER — SODIUM CHLORIDE 0.9 % IV SOLN
Freq: Once | INTRAVENOUS | Status: AC
  Administered 2013-12-20: 08:00:00 via INTRAVENOUS

## 2013-12-20 NOTE — Progress Notes (Signed)
Subjective: Nausea and vomiting last night and this morning. Denies chest pain or shortness of breath. No diarrhea. Abdominal pain increased last night and staying the same this AM. He has area of R hip cellulitis that he says is unchanged. Denies drainage.  Objective: Vital signs in last 24 hours: Filed Vitals:   12/19/13 1658 12/19/13 2130 12/20/13 0204 12/20/13 0651  BP: 176/79 141/67 140/72 166/97  Pulse: 111 111 106 108  Temp: 99.2 F (37.3 C) 99.9 F (37.7 C) 98.7 F (37.1 C) 99 F (37.2 C)  TempSrc: Oral Oral Oral Oral  Resp: 16 16 17 16   Height:      Weight: 369 lb 12.8 oz (167.74 kg)     SpO2: 98% 97% 97% 99%   Weight change:   Intake/Output Summary (Last 24 hours) at 12/20/13 0940 Last data filed at 12/20/13 0540  Gross per 24 hour  Intake      0 ml  Output    900 ml  Net   -900 ml   General: NAD HEENT: [place]/AT Cardiac: RRR, no rubs, murmurs or gallops Pulm: clear to auscultation bilaterally, moving normal volumes of air Abd: soft, + BS, nondistended, TTP Ext: +1 B/L LE edema, Dressing in place on R foot, minimally TTP, new superficial wound 2x1cm on plantar surface just beneath 3rd toe that does not have drainage or erythema or warmth. 5cm area of induration on R posterior hip with warmth that feels less warm and indurated this morning. No drainage. Neuro: alert and oriented X3, responding appropriately, follows commands, able to sit up for lung exam  Lab Results: Basic Metabolic Panel:  Recent Labs Lab 12/19/13 0421 12/20/13 0351  NA 137 138  K 4.3 5.1  CL 103 103  CO2 16* 12*  GLUCOSE 80 189*  BUN 36* 46*  CREATININE 1.98* 2.30*  CALCIUM 8.7 8.8   Liver Function Tests:  Recent Labs Lab 12/15/13 1322 12/20/13 0351  AST 24 18  ALT 26 21  ALKPHOS 261* 223*  BILITOT 1.2 0.6  PROT 7.9 7.8  ALBUMIN 1.9* 1.7*   CBC:  Recent Labs Lab 12/19/13 0421 12/20/13 0351  WBC 26.9* 25.5*  NEUTROABS 22.2* 21.2*  HGB 7.0* 6.9*  HCT 23.5* 22.3*    MCV 87.4 90.3  PLT 635* 624*   CBG:  Recent Labs Lab 12/18/13 2152 12/19/13 0751 12/19/13 1229 12/19/13 1656 12/19/13 2129 12/20/13 0749  GLUCAP 77 101* 125* 161* 181* 201*   Coagulation:  Recent Labs Lab 12/17/13 0340 12/18/13 0428 12/19/13 0421 12/20/13 0351  LABPROT 42.1* 45.7* 50.7* 57.1*  INR 4.38* 4.85* 5.55* 6.45*   Urine Drug Screen: Drugs of Abuse     Component Value Date/Time   LABOPIA POSITIVE* 07/12/2011 0038   COCAINSCRNUR NONE DETECTED 07/12/2011 0038   LABBENZ NONE DETECTED 07/12/2011 0038   AMPHETMU NONE DETECTED 07/12/2011 0038   THCU NONE DETECTED 07/12/2011 0038   LABBARB NONE DETECTED 07/12/2011 0038    Micro Results: Recent Results (from the past 240 hour(s))  Anaerobic culture     Status: None   Collection Time: 12/12/13  2:29 AM  Result Value Ref Range Status   Specimen Description ABSCESS RIGHT FOOT  Final   Special Requests NONE  Final   Gram Stain   Final    ABUNDANT WBC PRESENT,BOTH PMN AND MONONUCLEAR NO SQUAMOUS EPITHELIAL CELLS SEEN RARE GRAM POSITIVE COCCI IN PAIRS IN CLUSTERS Performed at [facility]    Culture   Final    NO  ANAEROBES ISOLATED Performed at [facility]    Report Status 12/17/2013 FINAL  Final  Culture, routine-abscess     Status: None   Collection Time: 12/12/13  2:29 AM  Result Value Ref Range Status   Specimen Description ABSCESS RIGHT FOOT  Final   Special Requests NONE  Final   Gram Stain   Final    ABUNDANT WBC PRESENT,BOTH PMN AND MONONUCLEAR NO SQUAMOUS EPITHELIAL CELLS SEEN RARE GRAM POSITIVE COCCI IN PAIRS IN CLUSTERS Performed at [facility]    Culture   Final    FEW METHICILLIN RESISTANT STAPHYLOCOCCUS AUREUS Note: RIFAMPIN AND GENTAMICIN SHOULD NOT BE USED AS SINGLE DRUGS FOR TREATMENT OF STAPH INFECTIONS. This organism DOES NOT demonstrate inducible Clindamycin resistance in vitro. CRITICAL RESULT CALLED TO, READ BACK BY AND VERIFIED WITH: DARLA T 12/6 @   945 BY REAMM Performed at [facility]    Report Status 12/14/2013 FINAL  Final   Organism ID, Bacteria METHICILLIN RESISTANT STAPHYLOCOCCUS AUREUS  Final      Susceptibility   Methicillin resistant staphylococcus aureus - MIC*    CLINDAMYCIN <=0.25 SENSITIVE Sensitive     ERYTHROMYCIN >=8 RESISTANT Resistant     GENTAMICIN <=0.5 SENSITIVE Sensitive     LEVOFLOXACIN 4 INTERMEDIATE Intermediate     OXACILLIN >=4 RESISTANT Resistant     PENICILLIN >=0.5 RESISTANT Resistant     RIFAMPIN <=0.5 SENSITIVE Sensitive     TRIMETH/SULFA <=10 SENSITIVE Sensitive     VANCOMYCIN 1 SENSITIVE Sensitive     TETRACYCLINE <=1 SENSITIVE Sensitive     * FEW METHICILLIN RESISTANT STAPHYLOCOCCUS AUREUS  Culture, blood (routine x 2)     Status: None   Collection Time: 12/12/13  7:03 PM  Result Value Ref Range Status   Specimen Description BLOOD RIGHT ARM  Final   Special Requests BOTTLES DRAWN AEROBIC AND ANAEROBIC 10CC  Final   Culture  Setup Time   Final    12/13/2013 01:04 Performed at [facility]    Culture   Final    NO GROWTH 5 DAYS Performed at [facility]    Report Status 12/19/2013 FINAL  Final  Culture, blood (routine x 2)     Status: None   Collection Time: 12/12/13  7:15 PM  Result Value Ref Range Status   Specimen Description BLOOD LEFT HAND  Final   Special Requests BOTTLES DRAWN AEROBIC AND ANAEROBIC 10CC  Final   Culture  Setup Time   Final    12/13/2013 01:04 Performed at [facility]    Culture   Final    NO GROWTH 5 DAYS Performed at [facility]    Report Status 12/19/2013 FINAL  Final  Culture, blood (routine x 2)     Status: None   Collection Time: 12/13/13  8:47 PM  Result Value Ref Range Status   Specimen Description BLOOD RIGHT ARM  Final   Special Requests BOTTLES DRAWN AEROBIC ONLY 10CC  Final   Culture  Setup Time   Final    12/14/2013 03:54 Performed at [place]   Final    NO GROWTH 5  DAYS Performed at [facility]    Report Status 12/20/2013 FINAL  Final  Culture, blood (routine x 2)     Status: None   Collection Time: 12/13/13  9:03 PM  Result Value Ref Range Status   Specimen Description BLOOD RIGHT ARM  Final   Special Requests BOTTLES DRAWN  AEROBIC ONLY 2CC  Final   Culture  Setup Time   Final    12/14/2013 03:54 Performed at [facility]    Culture   Final    NO GROWTH 5 DAYS Performed at [facility]    Report Status 12/20/2013 FINAL  Final  Culture, Urine     Status: None   Collection Time: 12/14/13 12:15 AM  Result Value Ref Range Status   Specimen Description URINE, CLEAN CATCH  Final   Special Requests NONE  Final   Culture  Setup Time   Final    12/14/2013 01:11 Performed at [place] Performed at [facility]   Final   Culture NO GROWTH Performed at [facility]   Final   Report Status 12/15/2013 FINAL  Final  Surgical pcr screen     Status: Abnormal   Collection Time: 12/15/13 12:00 AM  Result Value Ref Range Status   MRSA, PCR NEGATIVE NEGATIVE Final   Staphylococcus aureus POSITIVE (A) NEGATIVE Final    Comment:        The Xpert SA Assay (FDA approved for NASAL specimens in patients over 42 years of age), is one component of a comprehensive surveillance program.  Test performance has been validated by [employer] for patients greater than or equal to 78 year old. It is not intended to diagnose infection nor to guide or monitor treatment. RESULT CALLED TO, READ BACK BY AND VERIFIED WITH: C OGIN AJALA,RN 12/15/13 0226 BY RHOLMES   Culture, routine-abscess     Status: None   Collection Time: 12/15/13  1:09 AM  Result Value Ref Range Status   Specimen Description ABSCESS RIGHT FOOT  Final   Special Requests MEDIAL PATIENT ON FOLLOWING ZOSYN VANCOMYCIN  Final   Gram Stain   Final    RARE WBC PRESENT,BOTH PMN AND MONONUCLEAR NO SQUAMOUS EPITHELIAL  CELLS SEEN NO ORGANISMS SEEN Performed at [facility]    Culture   Final    NO GROWTH 3 DAYS Performed at [facility]    Report Status 12/18/2013 FINAL  Final  Anaerobic culture     Status: None (Preliminary result)   Collection Time: 12/15/13  1:09 AM  Result Value Ref Range Status   Specimen Description ABSCESS RIGHT FOOT  Final   Special Requests MEDIAL PATIENT ON FOLLOWING ZOSYN VANCOMYCIN  Final   Gram Stain   Final    RARE WBC PRESENT,BOTH PMN AND MONONUCLEAR NO SQUAMOUS EPITHELIAL CELLS SEEN NO ORGANISMS SEEN Performed at [facility]    Culture   Final    NO ANAEROBES ISOLATED; CULTURE IN PROGRESS FOR 5 DAYS Performed at [facility]    Report Status PENDING  Incomplete  Clostridium Difficile by PCR     Status: None   Collection Time: 12/16/13  5:34 PM  Result Value Ref Range Status   C difficile by pcr NEGATIVE NEGATIVE Final   Studies/Results: Ct Abdomen Pelvis Wo Contrast  12/19/2013   CLINICAL DATA:  Pt states been having abd pain and vomiting all week. Physician order notes: Patient on IV abx x 11 days for foot cellulitis and abscess with c/o of vomiting/abdominal pain/decreased po for several days. Diarrhea initially which has resolved. Cdiff negative. WBC continues to increase despite appropriate abx selection and I&D of abscess. No IV contrast due to CKD. Increasing INR and decreasing HGB.  EXAM: CT ABDOMEN AND PELVIS WITHOUT CONTRAST  TECHNIQUE: Multidetector  CT imaging of the abdomen and pelvis was performed following the standard protocol without IV contrast.  COMPARISON:  None.  FINDINGS: Lung bases demonstrate subtle hazy density likely atelectasis although cannot exclude developing infection. Small amount of pericardial fluid is present.  Abdominal images demonstrate a normal liver, spleen, pancreas and adrenal glands. Gallbladder demonstrates minimal increased density material over the fundus which may represent sludge/ stones.  Kidneys are normal in size without hydronephrosis or nephrolithiasis. Subtle ill definition of the intrarenal collecting systems. No significant perinephric fluid/inflammation. Ureters are within normal. The appendix is normal. Small bowel is within normal. There is no free fluid or inflammatory change within the abdomen.  There is mild diffuse subcutaneous edema over the abdominal wall.  Pelvic images demonstrate the bladder, prostate and rectum to be within normal. There are mild degenerative changes of the spine and hips.  IMPRESSION: No acute findings in the abdomen/pelvis.  Possible sludge versus stones within the gallbladder fundus.  Subtle hazy attenuation over the lung bases likely atelectasis although cannot exclude developing infection.  Mild subcutaneous edema over the abdominal wall.   Electronically Signed   By: Marin Olp M.D.   On: 12/19/2013 21:22   [place] Extrem Low Right Ltd  12/19/2013   CLINICAL DATA:  38 year old male with induration posterior to the left hip. Evaluate for subcutaneous abscess  EXAM: ULTRASOUND LEFT LOWER EXTREMITY LIMITED  TECHNIQUE: Ultrasound examination of the lower extremity soft tissues was performed in the area of clinical concern.  COMPARISON:  None  FINDINGS: Focal sonographic interrogation of the region of clinical concern demonstrates a small 7 mm focal anechoic fluid collection consistent with abscess. Otherwise, the region contains a hypoechoic but unremarkable appearing subcutaneous adipose tissue. Of note, the region is exquisitely painful to touch during sonography.  IMPRESSION: Cellulitis with focal 7 mm fluid collection concerning for small abscess in the superficial subcutaneous fat posterior to the left hip.   Electronically Signed   By: Jacqulynn Cadet M.D.   On: 12/19/2013 16:00   Medications: I have reviewed the patient's current medications. Scheduled Meds: . antiseptic oral rinse  7 mL Mouth Rinse q12n4p  . chlorhexidine  15 mL Mouth Rinse BID    . feeding supplement (GLUCERNA SHAKE)  237 mL Oral BID BM  . furosemide  40 mg Oral Daily  . insulin aspart  0-15 Units Subcutaneous TID WC  . pravastatin  20 mg Oral q1800  . vancomycin  2,000 mg Intravenous Q24H  . Warfarin - Pharmacist Dosing Inpatient   Does not apply q1800   Continuous Infusions:   PRN Meds:.acetaminophen **OR** acetaminophen, dextrose, HYDROmorphone (DILAUDID) injection, labetalol, menthol-cetylpyridinium, ondansetron (ZOFRAN) IV, oxyCODONE-acetaminophen, promethazine, senna-docusate   Assessment/Plan:  Diabetic foot ulcer and abscess, right foot with sepsis: S/p Bedside I&D 12/11/2013.  Wound cx growing few methicillin resistant staph aureus.  S/p repeat I&D 12/14/2013. No organisms seen on repeat wound culture. D/c'd zosyn on day 9. Remains on IV vanc. Persistent leukocytosis. Remains afebrile.  - continue IV Vancomycin (day 12)  - Consulted ID  - Percocet 5/369m 1-2 tab q4hr prn pain  - IV dilaudid 0.5132mQ4hr prn   R posterior hip cellulitis with focal 32m44mluid collection in superficial subcutaneous fat.  - continue to monitor  Diarrhea, abdominal pain, nausea/vomiting: C diff PCR negative Likely 2/2 antibiotic associated diarrhea. Diarrhea has resolved. CT abd pelvis with no acute findings. LFTs wnl except elevated Alk phos to 2 times the normal limit. GGT elevated. Likely of hepatic origin. Differential  includes chronic cholestatic (partial bile duct obstruction, primary biliary cholangitis (PBC), primary sclerosing cholangitis) or infiltrative liver diseases (sarcoidosis, other granulomatous diseases, amyloidosis).  - consider RUQ u/s  - continue to monitor  - IV phenergan 74m Q6hr prn  - D5 1/2 NS @ 75cc/hr  Acute on chronic anemia: Iron deficiency anemia. Ferritin elevated - likely acute phase reactant. Hgb 6.9 from 7.0.  - Transfuse 2 units pRBC  - Check FOBT  - Continue to monitor  Metabolic acidosis with increased anion gap: Likely 2/2 starvation  as he has had poor PO intake. Lactic acid wnl. No Ketones on UA  - D5 1/2 NS @ 75cc/hr  - Continue to monitor  Suspected Diabetic ketoacidosis secondary to type 2 diabetes: Resolved.    - SSI mod, CBG q AC/HS  - Lantus 15u QHS  AKI on CKD stage 2: Cr increased to 2.3 from 1.98 (baseline around 1.6)  - continue home lasix 43mdaily as IV 2067maily  - D5 1/2 NS @ 75cc/hr   Hypertension:   - Holding home lisinopril-hydrochlorothiazide.  - Continue home lasix 79m43mily as above  FEN:  - carb modified diet  - D5 1/2 NS @ 75cc/hr   Hx Recurrent DVTs, DVT ppx: SCDs, warfarin  Dispo: Disposition is deferred at this time, awaiting improvement of current medical problems.  Anticipated discharge in approximately 1-2 day(s).   The patient does have a current PCP (EriLucious Groves) and [facility].  The patient does not have transportation limitations that hinder transportation to clinic appointments.  .Services Needed at time of discharge: Y = Yes, Blank = No PT: Rolling walker with 5" wheels, 3 in 1  OT: 24hr assistance  RN:   Equipment:   Other:     LOS: 11 days   JennJacques Earthly 12/20/2013, 9:40 AM

## 2013-12-20 NOTE — Progress Notes (Signed)
CRITICAL VALUE ALERT  Critical value received:  Hemoglobin 6.9  Date of notification:  12/20/2013  Time of notification:  [id number]  Critical value read back:yes Nurse who received alert: Alba Cory RN  MD notified (1st page):  Dr.Rivet  Time of first page:  0628  MD notified (2nd page):  Time of second page:  Responding MD: Dr.Rivet  Time MD responded:  [id number]

## 2013-12-20 NOTE — Progress Notes (Signed)
[place] for Warfarin Indication: recurrent DVTs  No Known Allergies  Patient Measurements: Height: _0  (188 cm) Weight: (!) 369 lb 12.8 oz (167.74 kg) IBW/kg (Calculated) : 82.2  Vital Signs: Temp: 99 F (37.2 C) (12/12 0651) Temp Source: Oral (12/12 0651) BP: 166/97 mmHg (12/12 0651) Pulse Rate: 108 (12/12 0651)  Labs:  Recent Labs  12/18/13 0428 12/19/13 0421 12/20/13 0351  HGB 7.2* 7.0* 6.9*  HCT 22.9* 23.5* 22.3*  PLT 572* 635* 624*  LABPROT 45.7* 50.7* 57.1*  INR 4.85* 5.55* 6.45*  CREATININE 1.94* 1.98* 2.30*    Estimated Creatinine Clearance: 72.4 mL/min (by C-G formula based on Cr of 2.3).  Assessment: 38yom admitted for cellulitis.    Has h/o recurrent DVTs. He is supposed to be on warfarin as outpt but is non-compliant. Per outpatient note last home regimen warfarin 7.43m daily and 151mMon and Thur.  INR 1.28 on admission, received doses on 12/4 (7.51m64m 12/5 (51mg49mnd INR trended up to 6.23. Then received 2mg 73m12/8 and INR continues to increase- this morning INR is 6.45. LFTs are WNL with the exception of elevated alk phos. Unsure why INR continues to trend up despite not receiving any warfarin since 12/8.  Hgb 6.9, Plts elevated. No overt bleeding noted.  Goal of Therapy:  INR 2-3   Plan:  - No coumadin tonight - Daily INR - follow closely for s/s bleeding   Kalis Friese D. Ibraheem Voris, PharmD, BCPS Clinical Pharmacist Pager: [phone] 9:05 AM

## 2013-12-20 NOTE — Consult Note (Signed)
[facility]    Date of Admission:  12/09/2013           Day 12 vancomycin       Reason for Consult: Persistent leukocytosis    Referring Physician: Dr. Jacques Earthly Primary Care Physician: Dr. Johnnette Gourd  Principal Problem:   Cellulitis and abscess of right foot Active Problems:   Abscess of right hip   Leukocytosis   DM (diabetes mellitus) type I uncontrolled, periph vascular disorder   Morbid obesity   DEEP VENOUS THROMBOPHLEBITIS, HX OF   Foot ulcer, right   Essential hypertension   Hyperlipidemia   CKD (chronic kidney disease) stage 2, GFR 60-89 ml/min   GERD (gastroesophageal reflux disease)   Supratherapeutic INR   . antiseptic oral rinse  7 mL Mouth Rinse q12n4p  . chlorhexidine  15 mL Mouth Rinse BID  . feeding supplement (GLUCERNA SHAKE)  237 mL Oral BID BM  . [START ON 12/21/2013] furosemide  20 mg Intravenous Daily  . insulin aspart  0-15 Units Subcutaneous TID WC  . insulin glargine  15 Units Subcutaneous QHS  . pravastatin  20 mg Oral q1800  . vancomycin  2,000 mg Intravenous Q24H  . Warfarin - Pharmacist Dosing Inpatient   Does not apply q1800    Recommendations: 1. Continue vancomycin 2. Consider aspiration or I&D of superficial right hip abscess 3. Resume contact precautions for MRSA   Assessment: His persistent leukocytosis is probably due to MRSA infection of right foot and probably of a superficial right hip/buttock abscess. I would consider aspiration or incision and drainage of the right hip abscess if he is not improving soon. I agree with continuing vancomycin alone.    HPI: [name] is a 38 y.o. male poorly controlled diabetes and a chronic right plantar ulcer. He recently developed painful swelling of his right foot with fever and chills leading to admission on 12/09/2013. MRI revealed a superficial abscess which was drained on 12/11/2013. Operative Gram stain showed gram-positive cocci in  clusters and the culture grew MRSA. He was started on empiric vancomycin and piperacillin tazobactam. He underwent repeat I&D of a second superficial abscess on 12/15/2013. Gram stain and cultures were negative. His piperacillin tazobactam was stopped. Over the last week he has noted some painful swelling of his right hip. He states that it started as a pimple and has slowly enlarged. Ultrasound reveals a superficial 7 mm abscess. He has defervesced but has persistent leukocytosis. C. difficile PCR has been negative and he is no longer having any diarrhea. His had persistent nausea and vomiting. CT abdomen was unrevealing.   Review of Systems: Constitutional: positive for fatigue and malaise, negative for chills, fevers, sweats and weight loss Eyes: negative Ears, nose, mouth, throat, and face: negative Respiratory: negative Cardiovascular: negative Gastrointestinal: positive for abdominal pain, nausea, vomiting and early satiety, negative for diarrhea Genitourinary:negative  Past Medical History  Diagnosis Date  . DVT (deep venous thrombosis) 09/2002    patient reports additional DVTs in '06 & '11 (unconfirmed)  . Pulmonary embolism 09/2002    treated with 6 months of warfarin  . Hypertension   . Chest pain, neg MI, normal coronaries by cath 02/18/2013  . Hyperlipidemia 02/19/2013  . Acute venous embolism and thrombosis of deep vessels of proximal lower extremity 07/19/2011  . Type I diabetes mellitus dx'd 2001  . GERD (gastroesophageal reflux disease)   . Diabetic ulcer of right foot   .  CKD (chronic kidney disease) stage 3, GFR 30-59 ml/min 02/19/2013  . Nephrotic syndrome     History  Substance Use Topics  . Smoking status: Never Smoker   . Smokeless tobacco: Never Used  . Alcohol Use: Yes     Comment: 12/09/2013 "a few beers q 6 months or so"    Family History  Problem Relation Age of Onset  . Heart disease    . Diabetes    . Obesity     No Known  Allergies  OBJECTIVE: Blood pressure 156/74, pulse 87, temperature 98.7 F (37.1 C), temperature source Oral, resp. rate 20, height 6\' 2"  (1.88 m), weight 369 lb 12.8 oz (167.74 kg), SpO2 94 %. General: He appears uncomfortable due to nausea, laying in bed, Skin: No rash. Current and previous IV sites look good Lungs: Clear Cor: Regular S1 and S2 with no murmurs Abdomen: Soft and nontender Joints and extremities: His right leg is swollen from the knee down and slightly warm. There is some serosanguineous drainage on his right foot dressing. He has a small area of induration and tenderness on his right hip/buttock  Lab Results Lab Results  Component Value Date   WBC 25.5* 12/20/2013   HGB 6.9* 12/20/2013   HCT 22.3* 12/20/2013   MCV 90.3 12/20/2013   PLT 624* 12/20/2013    Lab Results  Component Value Date   CREATININE 2.30* 12/20/2013   BUN 46* 12/20/2013   NA 138 12/20/2013   K 5.1 12/20/2013   CL 103 12/20/2013   CO2 12* 12/20/2013    Lab Results  Component Value Date   ALT 21 12/20/2013   AST 18 12/20/2013   ALKPHOS 223* 12/20/2013   BILITOT 0.6 12/20/2013     Microbiology: Recent Results (from the past 240 hour(s))  Anaerobic culture     Status: None   Collection Time: 12/12/13  2:29 AM  Result Value Ref Range Status   Specimen Description ABSCESS RIGHT FOOT  Final   Special Requests NONE  Final   Gram Stain   Final    ABUNDANT WBC PRESENT,BOTH PMN AND MONONUCLEAR NO SQUAMOUS EPITHELIAL CELLS SEEN RARE GRAM POSITIVE COCCI IN PAIRS IN CLUSTERS Performed at [facility]    Culture   Final    NO ANAEROBES ISOLATED Performed at [facility]    Report Status 12/17/2013 FINAL  Final  Culture, routine-abscess     Status: None   Collection Time: 12/12/13  2:29 AM  Result Value Ref Range Status   Specimen Description ABSCESS RIGHT FOOT  Final   Special Requests NONE  Final   Gram Stain   Final    ABUNDANT WBC PRESENT,BOTH PMN AND  MONONUCLEAR NO SQUAMOUS EPITHELIAL CELLS SEEN RARE GRAM POSITIVE COCCI IN PAIRS IN CLUSTERS Performed at [facility]    Culture   Final    FEW METHICILLIN RESISTANT STAPHYLOCOCCUS AUREUS Note: RIFAMPIN AND GENTAMICIN SHOULD NOT BE USED AS SINGLE DRUGS FOR TREATMENT OF STAPH INFECTIONS. This organism DOES NOT demonstrate inducible Clindamycin resistance in vitro. CRITICAL RESULT CALLED TO, READ BACK BY AND VERIFIED WITH: DARLA T 12/6 @  945 BY REAMM Performed at [facility]    Report Status 12/14/2013 FINAL  Final   Organism ID, Bacteria METHICILLIN RESISTANT STAPHYLOCOCCUS AUREUS  Final      Susceptibility   Methicillin resistant staphylococcus aureus - MIC*    CLINDAMYCIN <=0.25 SENSITIVE Sensitive     ERYTHROMYCIN >=8 RESISTANT Resistant     GENTAMICIN <=0.5  SENSITIVE Sensitive     LEVOFLOXACIN 4 INTERMEDIATE Intermediate     OXACILLIN >=4 RESISTANT Resistant     PENICILLIN >=0.5 RESISTANT Resistant     RIFAMPIN <=0.5 SENSITIVE Sensitive     TRIMETH/SULFA <=10 SENSITIVE Sensitive     VANCOMYCIN 1 SENSITIVE Sensitive     TETRACYCLINE <=1 SENSITIVE Sensitive     * FEW METHICILLIN RESISTANT STAPHYLOCOCCUS AUREUS  Culture, blood (routine x 2)     Status: None   Collection Time: 12/12/13  7:03 PM  Result Value Ref Range Status   Specimen Description BLOOD RIGHT ARM  Final   Special Requests BOTTLES DRAWN AEROBIC AND ANAEROBIC 10CC  Final   Culture  Setup Time   Final    12/13/2013 01:04 Performed at [facility]    Culture   Final    NO GROWTH 5 DAYS Performed at [facility]    Report Status 12/19/2013 FINAL  Final  Culture, blood (routine x 2)     Status: None   Collection Time: 12/12/13  7:15 PM  Result Value Ref Range Status   Specimen Description BLOOD LEFT HAND  Final   Special Requests BOTTLES DRAWN AEROBIC AND ANAEROBIC 10CC  Final   Culture  Setup Time   Final    12/13/2013 01:04 Performed at [facility]    Culture    Final    NO GROWTH 5 DAYS Performed at [facility]    Report Status 12/19/2013 FINAL  Final  Culture, blood (routine x 2)     Status: None   Collection Time: 12/13/13  8:47 PM  Result Value Ref Range Status   Specimen Description BLOOD RIGHT ARM  Final   Special Requests BOTTLES DRAWN AEROBIC ONLY 10CC  Final   Culture  Setup Time   Final    12/14/2013 03:54 Performed at [place]   Final    NO GROWTH 5 DAYS Performed at [facility]    Report Status 12/20/2013 FINAL  Final  Culture, blood (routine x 2)     Status: None   Collection Time: 12/13/13  9:03 PM  Result Value Ref Range Status   Specimen Description BLOOD RIGHT ARM  Final   Special Requests BOTTLES DRAWN AEROBIC ONLY 2CC  Final   Culture  Setup Time   Final    12/14/2013 03:54 Performed at [place]   Final    NO GROWTH 5 DAYS Performed at [facility]    Report Status 12/20/2013 FINAL  Final  Culture, Urine     Status: None   Collection Time: 12/14/13 12:15 AM  Result Value Ref Range Status   Specimen Description URINE, CLEAN CATCH  Final   Special Requests NONE  Final   Culture  Setup Time   Final    12/14/2013 01:11 Performed at [place] Performed at [facility]   Final   Culture NO GROWTH Performed at [facility]   Final   Report Status 12/15/2013 FINAL  Final  Surgical pcr screen     Status: Abnormal   Collection Time: 12/15/13 12:00 AM  Result Value Ref Range Status   MRSA, PCR NEGATIVE NEGATIVE Final   Staphylococcus aureus POSITIVE (A) NEGATIVE Final    Comment:        The Xpert SA Assay (FDA approved for NASAL specimens in patients over 21 years of  age), is one component of a comprehensive surveillance program.  Test performance has been validated by [facility] for patients greater than or equal to 16 year old. It is not intended to diagnose infection nor  to guide or monitor treatment. RESULT CALLED TO, READ BACK BY AND VERIFIED WITH: C OGIN AJALA,RN 12/15/13 0226 BY RHOLMES   Culture, routine-abscess     Status: None   Collection Time: 12/15/13  1:09 AM  Result Value Ref Range Status   Specimen Description ABSCESS RIGHT FOOT  Final   Special Requests MEDIAL PATIENT ON FOLLOWING ZOSYN VANCOMYCIN  Final   Gram Stain   Final    RARE WBC PRESENT,BOTH PMN AND MONONUCLEAR NO SQUAMOUS EPITHELIAL CELLS SEEN NO ORGANISMS SEEN Performed at [facility]    Culture   Final    NO GROWTH 3 DAYS Performed at [facility]    Report Status 12/18/2013 FINAL  Final  Anaerobic culture     Status: None   Collection Time: 12/15/13  1:09 AM  Result Value Ref Range Status   Specimen Description ABSCESS RIGHT FOOT  Final   Special Requests MEDIAL PATIENT ON FOLLOWING ZOSYN VANCOMYCIN  Final   Gram Stain   Final    RARE WBC PRESENT,BOTH PMN AND MONONUCLEAR NO SQUAMOUS EPITHELIAL CELLS SEEN NO ORGANISMS SEEN Performed at [facility]    Culture   Final    NO ANAEROBES ISOLATED Performed at [facility]    Report Status 12/20/2013 FINAL  Final  Clostridium Difficile by PCR     Status: None   Collection Time: 12/16/13  5:34 PM  Result Value Ref Range Status   C difficile by pcr NEGATIVE NEGATIVE Final    Michel Bickers, MD [place] [facility] [phone] pager   [phone] cell 12/20/2013, 1:16 PM

## 2013-12-20 NOTE — Progress Notes (Signed)
Dear Doctor:  This patient has been identified as a candidate for PICC for the following reason (s): IV therapy over 48 hours If you agree, please write an order for the indicated device. For any questions contact the Vascular Access Team at [phone] if no answer, please leave a message.  Thank you for supporting the early vascular access assessment program.

## 2013-12-20 NOTE — Progress Notes (Signed)
CRITICAL VALUE ALERT  Critical value received:  INR 6.45  Date of notification:  12/20/2013  Time of notification:  0619  Critical value read back:yes  Nurse who received alert:  Kathyrn Drown RN  MD notified (1st page):  Dr.Rivet  Time of first page:  0628  MD notified (2nd page):  Time of second page:  Responding MD:  Dr.Rivet  Time MD responded:  [id number]

## 2013-12-21 ENCOUNTER — Inpatient Hospital Stay (HOSPITAL_COMMUNITY): Payer: Medicaid Other

## 2013-12-21 DIAGNOSIS — L0231 Cutaneous abscess of buttock: Secondary | ICD-10-CM

## 2013-12-21 DIAGNOSIS — B9562 Methicillin resistant Staphylococcus aureus infection as the cause of diseases classified elsewhere: Secondary | ICD-10-CM

## 2013-12-21 LAB — CBC WITH DIFFERENTIAL/PLATELET
Basophils Absolute: 0 10*3/uL (ref 0.0–0.1)
Basophils Relative: 0 % (ref 0–1)
Eosinophils Absolute: 0.2 10*3/uL (ref 0.0–0.7)
Eosinophils Relative: 1 % (ref 0–5)
HCT: 26.3 % — ABNORMAL LOW (ref 39.0–52.0)
Hemoglobin: 8.3 g/dL — ABNORMAL LOW (ref 13.0–17.0)
Lymphocytes Relative: 8 % — ABNORMAL LOW (ref 12–46)
Lymphs Abs: 1.9 10*3/uL (ref 0.7–4.0)
MCH: 26.9 pg (ref 26.0–34.0)
MCHC: 31.6 g/dL (ref 30.0–36.0)
MCV: 85.1 fL (ref 78.0–100.0)
MONO ABS: 1.6 10*3/uL — AB (ref 0.1–1.0)
MONOS PCT: 7 % (ref 3–12)
NEUTROS PCT: 84 % — AB (ref 43–77)
Neutro Abs: 19.8 10*3/uL — ABNORMAL HIGH (ref 1.7–7.7)
PLATELETS: 690 10*3/uL — AB (ref 150–400)
RBC: 3.09 MIL/uL — ABNORMAL LOW (ref 4.22–5.81)
RDW: 16.6 % — ABNORMAL HIGH (ref 11.5–15.5)
WBC: 23.5 10*3/uL — ABNORMAL HIGH (ref 4.0–10.5)

## 2013-12-21 LAB — GLUCOSE, CAPILLARY
GLUCOSE-CAPILLARY: 237 mg/dL — AB (ref 70–99)
Glucose-Capillary: 298 mg/dL — ABNORMAL HIGH (ref 70–99)
Glucose-Capillary: 284 mg/dL — ABNORMAL HIGH (ref 70–99)
Glucose-Capillary: 273 mg/dL — ABNORMAL HIGH (ref 70–99)

## 2013-12-21 LAB — COMPREHENSIVE METABOLIC PANEL
ALK PHOS: 316 U/L — AB (ref 39–117)
ALT: 22 U/L (ref 0–53)
AST: 18 U/L (ref 0–37)
Albumin: 2 g/dL — ABNORMAL LOW (ref 3.5–5.2)
Anion gap: 20 — ABNORMAL HIGH (ref 5–15)
BUN: 52 mg/dL — ABNORMAL HIGH (ref 6–23)
CO2: 15 mEq/L — ABNORMAL LOW (ref 19–32)
Calcium: 8.9 mg/dL (ref 8.4–10.5)
Chloride: 103 mEq/L (ref 96–112)
Creatinine, Ser: 2.35 mg/dL — ABNORMAL HIGH (ref 0.50–1.35)
GFR calc non Af Amer: 34 mL/min — ABNORMAL LOW (ref >90)
GFR, EST AFRICAN AMERICAN: 39 mL/min — AB (ref >90)
Glucose, Bld: 308 mg/dL — ABNORMAL HIGH (ref 70–99)
POTASSIUM: 4.8 meq/L (ref 3.7–5.3)
SODIUM: 138 meq/L (ref 137–147)
TOTAL PROTEIN: 8.2 g/dL (ref 6.0–8.3)
Total Bilirubin: 0.6 mg/dL (ref 0.3–1.2)

## 2013-12-21 LAB — PROTIME-INR
INR: 6.59 — AB (ref 0.00–1.49)
PROTHROMBIN TIME: 58 s — AB (ref 11.6–15.2)

## 2013-12-21 MED ORDER — VITAMIN K1 10 MG/ML IJ SOLN
5.0000 mg | Freq: Once | INTRAVENOUS | Status: AC
  Administered 2013-12-21: 5 mg via INTRAVENOUS
  Filled 2013-12-21: qty 0.5

## 2013-12-21 MED ORDER — INSULIN GLARGINE 100 UNIT/ML ~~LOC~~ SOLN
18.0000 [IU] | Freq: Every day | SUBCUTANEOUS | Status: DC
  Administered 2013-12-21: 18 [IU] via SUBCUTANEOUS
  Filled 2013-12-21 (×2): qty 0.18

## 2013-12-21 MED ORDER — PHYTONADIONE 5 MG PO TABS
2.5000 mg | ORAL_TABLET | Freq: Once | ORAL | Status: DC
  Filled 2013-12-21: qty 1

## 2013-12-21 MED ORDER — HYDROMORPHONE HCL 1 MG/ML IJ SOLN
1.0000 mg | INTRAMUSCULAR | Status: DC | PRN
  Administered 2013-12-21 – 2013-12-23 (×5): 1 mg via INTRAVENOUS
  Filled 2013-12-21 (×6): qty 1

## 2013-12-21 NOTE — Progress Notes (Signed)
Subjective: Afebrile overnight.  Patient seen and examined overnight.  He says he had a rough night because of continued vomiting.  He is still tolerating liquids (gatorade, sprite zero).  Neurosurgery spoke with him this AM and he is aware that he may need BKA.  Says he is trying not to cry.  He denies SI and feels he needs some time to take in this information about likely loosing his foot. He says he speaks with his sister on the phone most days and his family member come to visit.    Objective: Vital signs in last 24 hours: Filed Vitals:   12/20/13 1835 12/20/13 2113 12/21/13 0145 12/21/13 0546  BP: 127/67 183/94 135/80 190/96  Pulse: 95 106 98 109  Temp: 99.1 F (37.3 C) 98 F (36.7 C) 98 F (36.7 C) 98 F (36.7 C)  TempSrc: Oral Oral Oral Oral  Resp: 18  18 18   Height:      Weight:      SpO2: 96% 90% 96% 94%   Weight change: -1.56 kg (-3 lb 7 oz)  Intake/Output Summary (Last 24 hours) at 12/21/13 0747 Last data filed at 12/20/13 2308  Gross per 24 hour  Intake   1604 ml  Output    700 ml  Net    904 ml   General: NAD HEENT: [place]/AT Cardiac: RRR, no rubs, murmurs or gallops Pulm: clear to auscultation bilaterally, moving normal volumes of air Abd: soft, + BS, nondistended, TTP Ext: +1 B/L LE edema, Dressing in place on R foot, minimally TTP, new superficial wound 2x1cm on plantar surface just beneath 3rd toe that does not have drainage or erythema or warmth. 5cm area of induration on R posterior hip with warmth that feels less warm and indurated this morning. No drainage. Neuro: alert and oriented X3, responding appropriately, follows commands  Lab Results: Basic Metabolic Panel:  Recent Labs Lab 12/20/13 0351 12/21/13 0410  NA 138 138  K 5.1 4.8  CL 103 103  CO2 12* 15*  GLUCOSE 189* 308*  BUN 46* 52*  CREATININE 2.30* 2.35*  CALCIUM 8.8 8.9   Liver Function Tests:  Recent Labs Lab 12/20/13 0351 12/21/13 0410  AST 18 18  ALT 21 22  ALKPHOS 223*  316*  BILITOT 0.6 0.6  PROT 7.8 8.2  ALBUMIN 1.7* 2.0*   CBC:  Recent Labs Lab 12/20/13 0351 12/20/13 1957 12/21/13 0410  WBC 25.5* 24.3* 23.5*  NEUTROABS 21.2*  --  19.8*  HGB 6.9* 8.1* 8.3*  HCT 22.3* 25.5* 26.3*  MCV 90.3 85.9 85.1  PLT 624* 587* 690*   CBG:  Recent Labs Lab 12/19/13 2129 12/20/13 0749 12/20/13 1140 12/20/13 1744 12/20/13 2118 12/21/13 0725  GLUCAP 181* 201* 188* 237* 238* 298*   Coagulation:  Recent Labs Lab 12/18/13 0428 12/19/13 0421 12/20/13 0351 12/21/13 0410  LABPROT 45.7* 50.7* 57.1* 58.0*  INR 4.85* 5.55* 6.45* 6.59*   Urine Drug Screen: Drugs of Abuse     Component Value Date/Time   LABOPIA POSITIVE* 07/12/2011 0038   COCAINSCRNUR NONE DETECTED 07/12/2011 0038   LABBENZ NONE DETECTED 07/12/2011 0038   AMPHETMU NONE DETECTED 07/12/2011 0038   THCU NONE DETECTED 07/12/2011 0038   LABBARB NONE DETECTED 07/12/2011 0038    Micro Results: Recent Results (from the past 240 hour(s))  Anaerobic culture     Status: None   Collection Time: 12/12/13  2:29 AM  Result Value Ref Range Status   Specimen Description ABSCESS RIGHT FOOT  Final  Special Requests NONE  Final   Gram Stain   Final    ABUNDANT WBC PRESENT,BOTH PMN AND MONONUCLEAR NO SQUAMOUS EPITHELIAL CELLS SEEN RARE GRAM POSITIVE COCCI IN PAIRS IN CLUSTERS Performed at [facility]    Culture   Final    NO ANAEROBES ISOLATED Performed at [facility]    Report Status 12/17/2013 FINAL  Final  Culture, routine-abscess     Status: None   Collection Time: 12/12/13  2:29 AM  Result Value Ref Range Status   Specimen Description ABSCESS RIGHT FOOT  Final   Special Requests NONE  Final   Gram Stain   Final    ABUNDANT WBC PRESENT,BOTH PMN AND MONONUCLEAR NO SQUAMOUS EPITHELIAL CELLS SEEN RARE GRAM POSITIVE COCCI IN PAIRS IN CLUSTERS Performed at [facility]    Culture   Final    FEW METHICILLIN RESISTANT STAPHYLOCOCCUS AUREUS Note: RIFAMPIN  AND GENTAMICIN SHOULD NOT BE USED AS SINGLE DRUGS FOR TREATMENT OF STAPH INFECTIONS. This organism DOES NOT demonstrate inducible Clindamycin resistance in vitro. CRITICAL RESULT CALLED TO, READ BACK BY AND VERIFIED WITH: DARLA T 12/6 @  945 BY REAMM Performed at [facility]    Report Status 12/14/2013 FINAL  Final   Organism ID, Bacteria METHICILLIN RESISTANT STAPHYLOCOCCUS AUREUS  Final      Susceptibility   Methicillin resistant staphylococcus aureus - MIC*    CLINDAMYCIN <=0.25 SENSITIVE Sensitive     ERYTHROMYCIN >=8 RESISTANT Resistant     GENTAMICIN <=0.5 SENSITIVE Sensitive     LEVOFLOXACIN 4 INTERMEDIATE Intermediate     OXACILLIN >=4 RESISTANT Resistant     PENICILLIN >=0.5 RESISTANT Resistant     RIFAMPIN <=0.5 SENSITIVE Sensitive     TRIMETH/SULFA <=10 SENSITIVE Sensitive     VANCOMYCIN 1 SENSITIVE Sensitive     TETRACYCLINE <=1 SENSITIVE Sensitive     * FEW METHICILLIN RESISTANT STAPHYLOCOCCUS AUREUS  Culture, blood (routine x 2)     Status: None   Collection Time: 12/12/13  7:03 PM  Result Value Ref Range Status   Specimen Description BLOOD RIGHT ARM  Final   Special Requests BOTTLES DRAWN AEROBIC AND ANAEROBIC 10CC  Final   Culture  Setup Time   Final    12/13/2013 01:04 Performed at [facility]    Culture   Final    NO GROWTH 5 DAYS Performed at [facility]    Report Status 12/19/2013 FINAL  Final  Culture, blood (routine x 2)     Status: None   Collection Time: 12/12/13  7:15 PM  Result Value Ref Range Status   Specimen Description BLOOD LEFT HAND  Final   Special Requests BOTTLES DRAWN AEROBIC AND ANAEROBIC 10CC  Final   Culture  Setup Time   Final    12/13/2013 01:04 Performed at [facility]    Culture   Final    NO GROWTH 5 DAYS Performed at [facility]    Report Status 12/19/2013 FINAL  Final  Culture, blood (routine x 2)     Status: None   Collection Time: 12/13/13  8:47 PM  Result Value Ref Range  Status   Specimen Description BLOOD RIGHT ARM  Final   Special Requests BOTTLES DRAWN AEROBIC ONLY 10CC  Final   Culture  Setup Time   Final    12/14/2013 03:54 Performed at [place]   Final    NO GROWTH 5 DAYS Performed at [facility]  Report Status 12/20/2013 FINAL  Final  Culture, blood (routine x 2)     Status: None   Collection Time: 12/13/13  9:03 PM  Result Value Ref Range Status   Specimen Description BLOOD RIGHT ARM  Final   Special Requests BOTTLES DRAWN AEROBIC ONLY 2CC  Final   Culture  Setup Time   Final    12/14/2013 03:54 Performed at [place]   Final    NO GROWTH 5 DAYS Performed at [facility]    Report Status 12/20/2013 FINAL  Final  Culture, Urine     Status: None   Collection Time: 12/14/13 12:15 AM  Result Value Ref Range Status   Specimen Description URINE, CLEAN CATCH  Final   Special Requests NONE  Final   Culture  Setup Time   Final    12/14/2013 01:11 Performed at [place] Performed at [facility]   Final   Culture NO GROWTH Performed at [facility]   Final   Report Status 12/15/2013 FINAL  Final  Surgical pcr screen     Status: Abnormal   Collection Time: 12/15/13 12:00 AM  Result Value Ref Range Status   MRSA, PCR NEGATIVE NEGATIVE Final   Staphylococcus aureus POSITIVE (A) NEGATIVE Final    Comment:        The Xpert SA Assay (FDA approved for NASAL specimens in patients over 17 years of age), is one component of a comprehensive surveillance program.  Test performance has been validated by [employer] for patients greater than or equal to 16 year old. It is not intended to diagnose infection nor to guide or monitor treatment. RESULT CALLED TO, READ BACK BY AND VERIFIED WITH: C OGIN AJALA,RN 12/15/13 0226 BY RHOLMES   Culture, routine-abscess     Status: None   Collection Time: 12/15/13  1:09 AM  Result  Value Ref Range Status   Specimen Description ABSCESS RIGHT FOOT  Final   Special Requests MEDIAL PATIENT ON FOLLOWING ZOSYN VANCOMYCIN  Final   Gram Stain   Final    RARE WBC PRESENT,BOTH PMN AND MONONUCLEAR NO SQUAMOUS EPITHELIAL CELLS SEEN NO ORGANISMS SEEN Performed at [facility]    Culture   Final    NO GROWTH 3 DAYS Performed at [facility]    Report Status 12/18/2013 FINAL  Final  Anaerobic culture     Status: None   Collection Time: 12/15/13  1:09 AM  Result Value Ref Range Status   Specimen Description ABSCESS RIGHT FOOT  Final   Special Requests MEDIAL PATIENT ON FOLLOWING ZOSYN VANCOMYCIN  Final   Gram Stain   Final    RARE WBC PRESENT,BOTH PMN AND MONONUCLEAR NO SQUAMOUS EPITHELIAL CELLS SEEN NO ORGANISMS SEEN Performed at [facility]    Culture   Final    NO ANAEROBES ISOLATED Performed at [facility]    Report Status 12/20/2013 FINAL  Final  Clostridium Difficile by PCR     Status: None   Collection Time: 12/16/13  5:34 PM  Result Value Ref Range Status   C difficile by pcr NEGATIVE NEGATIVE Final   Studies/Results: Ct Abdomen Pelvis Wo Contrast  12/19/2013   CLINICAL DATA:  Pt states been having abd pain and vomiting all week. Physician order notes: Patient on IV abx x 11 days for foot cellulitis and abscess with c/o of vomiting/abdominal pain/decreased po for several days. Diarrhea  initially which has resolved. Cdiff negative. WBC continues to increase despite appropriate abx selection and I&D of abscess. No IV contrast due to CKD. Increasing INR and decreasing HGB.  EXAM: CT ABDOMEN AND PELVIS WITHOUT CONTRAST  TECHNIQUE: Multidetector CT imaging of the abdomen and pelvis was performed following the standard protocol without IV contrast.  COMPARISON:  None.  FINDINGS: Lung bases demonstrate subtle hazy density likely atelectasis although cannot exclude developing infection. Small amount of pericardial fluid is present.   Abdominal images demonstrate a normal liver, spleen, pancreas and adrenal glands. Gallbladder demonstrates minimal increased density material over the fundus which may represent sludge/ stones. Kidneys are normal in size without hydronephrosis or nephrolithiasis. Subtle ill definition of the intrarenal collecting systems. No significant perinephric fluid/inflammation. Ureters are within normal. The appendix is normal. Small bowel is within normal. There is no free fluid or inflammatory change within the abdomen.  There is mild diffuse subcutaneous edema over the abdominal wall.  Pelvic images demonstrate the bladder, prostate and rectum to be within normal. There are mild degenerative changes of the spine and hips.  IMPRESSION: No acute findings in the abdomen/pelvis.  Possible sludge versus stones within the gallbladder fundus.  Subtle hazy attenuation over the lung bases likely atelectasis although cannot exclude developing infection.  Mild subcutaneous edema over the abdominal wall.   Electronically Signed   By: Marin Olp M.D.   On: 12/19/2013 21:22   US Abdomen Limited  12/21/2013   CLINICAL DATA:  Increased alkaline phosphatase. Nausea and vomiting for a week.  EXAM: US ABDOMEN LIMITED - RIGHT UPPER QUADRANT  COMPARISON:  CT abdomen and pelvis 12/19/2013  FINDINGS: Technically limited examination due to patient's body habitus.  Gallbladder:  Gallbladder is somewhat contracted with diffusely increased echoes pattern centrally consistent with small stones and sludge. Borderline gallbladder wall thickness at 3.5 mm. Murphy's sign is negative.  Common bile duct:  Diameter: 7.2 mm, upper limits of normal.  Liver:  Diffusely increased parenchymal echotexture suggesting fatty infiltration. No focal liver lesions appreciated.  IMPRESSION: Mild gallbladder contraction. Gallbladder is filled with sludge and small stones. Murphy's sign is negative.   Electronically Signed   By: Lucienne Capers M.D.   On:  12/21/2013 03:04   [place] Extrem Low Right Ltd  12/19/2013   CLINICAL DATA:  38 year old male with induration posterior to the left hip. Evaluate for subcutaneous abscess  EXAM: ULTRASOUND LEFT LOWER EXTREMITY LIMITED  TECHNIQUE: Ultrasound examination of the lower extremity soft tissues was performed in the area of clinical concern.  COMPARISON:  None  FINDINGS: Focal sonographic interrogation of the region of clinical concern demonstrates a small 7 mm focal anechoic fluid collection consistent with abscess. Otherwise, the region contains a hypoechoic but unremarkable appearing subcutaneous adipose tissue. Of note, the region is exquisitely painful to touch during sonography.  IMPRESSION: Cellulitis with focal 7 mm fluid collection concerning for small abscess in the superficial subcutaneous fat posterior to the left hip.   Electronically Signed   By: Jacqulynn Cadet M.D.   On: 12/19/2013 16:00   Medications: I have reviewed the patient's current medications. Scheduled Meds: . antiseptic oral rinse  7 mL Mouth Rinse q12n4p  . chlorhexidine  15 mL Mouth Rinse BID  . feeding supplement (GLUCERNA SHAKE)  237 mL Oral BID BM  . furosemide  20 mg Intravenous Daily  . insulin aspart  0-15 Units Subcutaneous TID WC  . insulin glargine  15 Units Subcutaneous QHS  . pravastatin  20 mg  Oral q1800  . vancomycin  2,000 mg Intravenous Q24H  . Warfarin - Pharmacist Dosing Inpatient   Does not apply q1800   Continuous Infusions: . dextrose 5 % and 0.45% NaCl 75 mL/hr at 12/20/13 1010   PRN Meds:.acetaminophen **OR** acetaminophen, dextrose, HYDROmorphone (DILAUDID) injection, labetalol, menthol-cetylpyridinium, ondansetron (ZOFRAN) IV, oxyCODONE-acetaminophen, promethazine, senna-docusate   Assessment/Plan:  Diabetic foot ulcer and abscess, right foot with sepsis:  MRI negative for osteo.  S/p Bedside I&D 12/11/2013.  Wound cx growing few methicillin resistant staph aureus.  S/p repeat I&D 12/14/2013. No  organisms seen on repeat wound culture. D/c'd zosyn on day 9. Remains on IV vanc. Persistent leukocytosis, though decreasing slowly in recent days. Remains afebrile.  Appreciate orthopedics and ID recommendations/interventions.  Ortho feels that given progression of infection despite two I&Ds and IV vancomycin, the patient will likely need a BKA to cure the infection.  Ortho would like to take patient to OR today but cannot due to elevated INR (6.59).  Will give vitamin K and plan for OR on 12/15 if INR at a reasonable level.  - continue IV Vancomycin (day 13) - Percocet 5/363m 1-2 tab q4hr prn pain - Increase IV dilaudid 0.573mq4hr prn --> 14m22m3h prn - vitamin K 5mg63m once  R posterior hip cellulitis with focal 7mm 54mid collection in superficial subcutaneous fat.  Ortho plans to I&D this area at the same time of right foot BKA. - continue to monitor - plan for OR 12/15 if INR improved  Diarrhea, abdominal pain, nausea/vomiting: C diff PCR negative Likely 2/2 antibiotic associated diarrhea. Diarrhea has resolved. CT abd pelvis with no acute findings. LFTs wnl except elevated Alk phos to 2 times the normal limit. GGT elevated indicating likely hepatic origin. Differential includes chronic cholestatic (partial bile duct obstruction, primary biliary cholangitis (PBC), primary sclerosing cholangitis) or infiltrative liver diseases (sarcoidosis, other granulomatous diseases, amyloidosis).  RUQ US - [place] GB contraction, GB filled with sludge and small stones, murphy's sign negative, borderline GB wall thickness of 3.5mm, 48mfocal liver lesions.   - continue to monitor  - IV phenergan 25mg q78mrn  - pain control as above  - D5 1/2 NS @ 75cc/hr  Acute on chronic anemia: Iron deficiency anemia. Ferritin elevated - likely acute phase reactant. Hgb stable post-transfusion.  - Check FOBT  - Continue to monitor CBC  Metabolic acidosis with increased anion gap: Likely 2/2 starvation as he has had poor PO  intake. Lactic acid wnl. No Ketones on UA.  - D5 1/2 NS @ 75cc/hr  - Continue to monitor  Suspected Diabetic ketoacidosis secondary to type 2 diabetes: Resolved.  Novolog 70/30 d/c due to patient with poor po and a few lows in the 60s.  Lantus 15 units qHS started last night.  AM CBG 298.  He required 8 units SSI in the AM and at lunchtime today (8 units is given for CBG 251-300).  - SSI mod, CBG q AC/HS  - Increase Lantus 15u qHS --> 18 units qHS; need better glucose control in this patient with wound infx and pending OR next week, careful increase due to poor po and prior low CBGs. - continue D5 1/2 NS  AKI on CKD stage 2: Cr increased to 2.35 from 1.98 (baseline around 1.6). Lasix was resumed when his weight increased and Cr was starting to improve.  Now Cr back up.  - stop Lasix for now  - D5 1/2 NS @ 75cc/hr  - strict I&Os  Hypertension:   - Holding home lisinopril-hydrochlorothiazide in the setting of AKI  - Hold home lasix 75m daily as above  - labetalol prn for SBP >180  FEN:  - carb modified diet  - D5 1/2 NS @ 75cc/hr   Hx Recurrent DVTs, DVT ppx: SCDs, warfarin (on hold due to supratherapeutic INR and in anticipation of OR)  Dispo: Disposition is deferred at this time, awaiting improvement of current medical problems.  Anticipated discharge in approximately 1-2 day(s).   The patient does have a current PCP (Lucious Groves DO) and [facility].  The patient does not have transportation limitations that hinder transportation to clinic appointments.  .Services Needed at time of discharge: Y = Yes, Blank = No PT: Rolling walker with 5" wheels, 3 in 1  OT: 24hr assistance  RN:   Equipment:   Other:     LOS: 12 days   AFrancesca Oman DO 12/21/2013, 7:47 AM

## 2013-12-21 NOTE — Progress Notes (Signed)
CRITICAL VALUE ALERT  Critical value received:  INR 6.59   Date of notification:  12/21/13   Time of notification:  0630   Critical value read back:Yes   Nurse who received alert:  Gillis Ends RN  MD notified (1st page):  Dr. Marvel Plan  Time of first page:  [phone]  MD notified (2nd page):  Time of second page:  Responding MD:  Dr. Marvel Plan  Time MD responded:  [phone]

## 2013-12-21 NOTE — Progress Notes (Signed)
Subjective: 7 Days Post-Op Procedure(s) (LRB): IRRIGATION AND DEBRIDEMENT RIGHT FOOT (Right) Patient reports pain as severe at the abdomen.  No significant pain from the right foot.  Pt continuing to have elevated WBC, nausea and vomiting.  On IV vanc.  Dr. Hale Bogus notes reviewed.  Coumadin held for the last few days.    Objective: Vital signs in last 24 hours: Temp:  [98 F (36.7 C)-99.8 F (37.7 C)] 98 F (36.7 C) (12/13 0546) Pulse Rate:  [87-112] 109 (12/13 0546) Resp:  [16-20] 18 (12/13 0546) BP: (119-190)/(59-100) 190/96 mmHg (12/13 0546) SpO2:  [90 %-100 %] 94 % (12/13 0546) Weight:  [166.18 kg (366 lb 5.8 oz)] 166.18 kg (366 lb 5.8 oz) (12/12 1732)  Intake/Output from previous day: 12/12 0701 - 12/13 0700 In: [id number] [P.O.:120; I.V.:300; QY:[id number]; IV Piggyback:500] Out: 700 [Urine:700] Intake/Output this shift:     Recent Labs  12/19/13 0421 12/20/13 0351 12/20/13 1957 12/21/13 0410  HGB 7.0* 6.9* 8.1* 8.3*    Recent Labs  12/20/13 1957 12/21/13 0410  WBC 24.3* 23.5*  RBC 2.97* 3.09*  HCT 25.5* 26.3*  PLT 587* 690*    Recent Labs  12/20/13 0351 12/21/13 0410  NA 138 138  K 5.1 4.8  CL 103 103  CO2 12* 15*  BUN 46* 52*  CREATININE 2.30* 2.35*  GLUCOSE 189* 308*  CALCIUM 8.8 8.9    Recent Labs  12/20/13 0351 12/21/13 0410  INR 6.45* 6.59*    PE:  obese male in nad.  Alert and oriented.  EOMI.  resp unlabored.  Mood depressed.  Affect flat.  R buttock with tender firm area about a cm in diameter.  No fluctuance.     R foot with new ulcerated area beneath the 2nd MT head.  Superficial callous and skin is removed revealing a 1 cm ulceration that probes to the 2nd MT head and has gross pus.  The 4th MT head ulcer is still open with no pus.  The medial foot wound has no pus.  The foot is swollen, warm and slightly erythematous.  Assessment/Plan: R buttock superficial abscess - the patient will need I and D of this wound with packing.   Unfortunately his INR is quite elevated and precludes operative treatment at this time.  R foot recurrent abscess and osteomyelitis - despite multiple I and Ds and IV abx, the patient has had progression of his right foot infection.  He has extensive involvement of the forefoot with the plantar ulcers probing to bone.  He will require operative treatment when his INR comes down to a reasonable level.  I've explained the diagnosis to him in detail.  I believe he's likely to require BKA for cure of this refractory infection.  Coagulopathy - the patient's INR has trended upwards for the last 5 days to 6.59 despite holding his warfarin.  Given his urgent surgical needs, I'd recommend vitamin K today in hopes that his INR trend will reverse.  I discussed this plan in detail with Dr. Radford Pax.  He can eat today.  Surgery likely Tuesday PM if INR is reasonable.  [name] 12/21/2013, 9:12 AM

## 2013-12-21 NOTE — Progress Notes (Signed)
Patient ID: [name], male   DOB: May 14, 1975, 38 y.o.   MRN: [id number]         [facility]    Date of Admission:  12/09/2013           Day 13 vancomycin  Principal Problem:   Cellulitis and abscess of right foot Active Problems:   Abscess of right hip   Leukocytosis   DM (diabetes mellitus) type I uncontrolled, periph vascular disorder   Morbid obesity   DEEP VENOUS THROMBOPHLEBITIS, HX OF   Foot ulcer, right   Essential hypertension   Hyperlipidemia   CKD (chronic kidney disease) stage 2, GFR 60-89 ml/min   GERD (gastroesophageal reflux disease)   Supratherapeutic INR   . antiseptic oral rinse  7 mL Mouth Rinse q12n4p  . chlorhexidine  15 mL Mouth Rinse BID  . feeding supplement (GLUCERNA SHAKE)  237 mL Oral BID BM  . furosemide  20 mg Intravenous Daily  . insulin aspart  0-15 Units Subcutaneous TID WC  . insulin glargine  15 Units Subcutaneous QHS  . pravastatin  20 mg Oral q1800  . vancomycin  2,000 mg Intravenous Q24H  . Warfarin - Pharmacist Dosing Inpatient   Does not apply q1800    Subjective: He continues to have moderate to severe pain in his right foot.   OBJECTIVE: Blood pressure 190/96, pulse 99, temperature 98 F (36.7 C), temperature source Oral, resp. rate 18, height 6\' 2"  (1.88 m), weight 366 lb 5.8 oz (166.18 kg), SpO2 96 %.   General: Alert Skin: No change in small, superficial abscess over her right hip/buttock Right foot: Dr. Nona Dell exam noted a new ulcer under the second metatarsal head with possible probing down to bone  Lab Results Lab Results  Component Value Date   WBC 23.5* 12/21/2013   HGB 8.3* 12/21/2013   HCT 26.3* 12/21/2013   MCV 85.1 12/21/2013   PLT 690* 12/21/2013    Lab Results  Component Value Date   CREATININE 2.35* 12/21/2013   BUN 52* 12/21/2013   NA 138 12/21/2013   K 4.8 12/21/2013   CL 103 12/21/2013   CO2 15* 12/21/2013    Lab Results  Component Value Date   ALT 22  12/21/2013   AST 18 12/21/2013   ALKPHOS 316* 12/21/2013   BILITOT 0.6 12/21/2013     Microbiology: Recent Results (from the past 240 hour(s))  Anaerobic culture     Status: None   Collection Time: 12/12/13  2:29 AM  Result Value Ref Range Status   Specimen Description ABSCESS RIGHT FOOT  Final   Special Requests NONE  Final   Gram Stain   Final    ABUNDANT WBC PRESENT,BOTH PMN AND MONONUCLEAR NO SQUAMOUS EPITHELIAL CELLS SEEN RARE GRAM POSITIVE COCCI IN PAIRS IN CLUSTERS Performed at [facility]    Culture   Final    NO ANAEROBES ISOLATED Performed at [facility]    Report Status 12/17/2013 FINAL  Final  Culture, routine-abscess     Status: None   Collection Time: 12/12/13  2:29 AM  Result Value Ref Range Status   Specimen Description ABSCESS RIGHT FOOT  Final   Special Requests NONE  Final   Gram Stain   Final    ABUNDANT WBC PRESENT,BOTH PMN AND MONONUCLEAR NO SQUAMOUS EPITHELIAL CELLS SEEN RARE GRAM POSITIVE COCCI IN PAIRS IN CLUSTERS Performed at [facility]    Culture   Final    FEW  METHICILLIN RESISTANT STAPHYLOCOCCUS AUREUS Note: RIFAMPIN AND GENTAMICIN SHOULD NOT BE USED AS SINGLE DRUGS FOR TREATMENT OF STAPH INFECTIONS. This organism DOES NOT demonstrate inducible Clindamycin resistance in vitro. CRITICAL RESULT CALLED TO, READ BACK BY AND VERIFIED WITH: DARLA T 12/6 @  945 BY REAMM Performed at [facility]    Report Status 12/14/2013 FINAL  Final   Organism ID, Bacteria METHICILLIN RESISTANT STAPHYLOCOCCUS AUREUS  Final      Susceptibility   Methicillin resistant staphylococcus aureus - MIC*    CLINDAMYCIN <=0.25 SENSITIVE Sensitive     ERYTHROMYCIN >=8 RESISTANT Resistant     GENTAMICIN <=0.5 SENSITIVE Sensitive     LEVOFLOXACIN 4 INTERMEDIATE Intermediate     OXACILLIN >=4 RESISTANT Resistant     PENICILLIN >=0.5 RESISTANT Resistant     RIFAMPIN <=0.5 SENSITIVE Sensitive     TRIMETH/SULFA <=10 SENSITIVE Sensitive       VANCOMYCIN 1 SENSITIVE Sensitive     TETRACYCLINE <=1 SENSITIVE Sensitive     * FEW METHICILLIN RESISTANT STAPHYLOCOCCUS AUREUS  Culture, blood (routine x 2)     Status: None   Collection Time: 12/12/13  7:03 PM  Result Value Ref Range Status   Specimen Description BLOOD RIGHT ARM  Final   Special Requests BOTTLES DRAWN AEROBIC AND ANAEROBIC 10CC  Final   Culture  Setup Time   Final    12/13/2013 01:04 Performed at [facility]    Culture   Final    NO GROWTH 5 DAYS Performed at [facility]    Report Status 12/19/2013 FINAL  Final  Culture, blood (routine x 2)     Status: None   Collection Time: 12/12/13  7:15 PM  Result Value Ref Range Status   Specimen Description BLOOD LEFT HAND  Final   Special Requests BOTTLES DRAWN AEROBIC AND ANAEROBIC 10CC  Final   Culture  Setup Time   Final    12/13/2013 01:04 Performed at [facility]    Culture   Final    NO GROWTH 5 DAYS Performed at [facility]    Report Status 12/19/2013 FINAL  Final  Culture, blood (routine x 2)     Status: None   Collection Time: 12/13/13  8:47 PM  Result Value Ref Range Status   Specimen Description BLOOD RIGHT ARM  Final   Special Requests BOTTLES DRAWN AEROBIC ONLY 10CC  Final   Culture  Setup Time   Final    12/14/2013 03:54 Performed at [place]   Final    NO GROWTH 5 DAYS Performed at [facility]    Report Status 12/20/2013 FINAL  Final  Culture, blood (routine x 2)     Status: None   Collection Time: 12/13/13  9:03 PM  Result Value Ref Range Status   Specimen Description BLOOD RIGHT ARM  Final   Special Requests BOTTLES DRAWN AEROBIC ONLY 2CC  Final   Culture  Setup Time   Final    12/14/2013 03:54 Performed at [place]   Final    NO GROWTH 5 DAYS Performed at [facility]    Report Status 12/20/2013 FINAL  Final  Culture, Urine     Status: None   Collection Time: 12/14/13 12:15 AM   Result Value Ref Range Status   Specimen Description URINE, CLEAN CATCH  Final   Special Requests NONE  Final   Culture  Setup Time   Final  12/14/2013 01:11 Performed at [place] Performed at [facility]   Final   Culture NO GROWTH Performed at [facility]   Final   Report Status 12/15/2013 FINAL  Final  Surgical pcr screen     Status: Abnormal   Collection Time: 12/15/13 12:00 AM  Result Value Ref Range Status   MRSA, PCR NEGATIVE NEGATIVE Final   Staphylococcus aureus POSITIVE (A) NEGATIVE Final    Comment:        The Xpert SA Assay (FDA approved for NASAL specimens in patients over 80 years of age), is one component of a comprehensive surveillance program.  Test performance has been validated by [employer] for patients greater than or equal to 58 year old. It is not intended to diagnose infection nor to guide or monitor treatment. RESULT CALLED TO, READ BACK BY AND VERIFIED WITH: C OGIN AJALA,RN 12/15/13 0226 BY RHOLMES   Culture, routine-abscess     Status: None   Collection Time: 12/15/13  1:09 AM  Result Value Ref Range Status   Specimen Description ABSCESS RIGHT FOOT  Final   Special Requests MEDIAL PATIENT ON FOLLOWING ZOSYN VANCOMYCIN  Final   Gram Stain   Final    RARE WBC PRESENT,BOTH PMN AND MONONUCLEAR NO SQUAMOUS EPITHELIAL CELLS SEEN NO ORGANISMS SEEN Performed at [facility]    Culture   Final    NO GROWTH 3 DAYS Performed at [facility]    Report Status 12/18/2013 FINAL  Final  Anaerobic culture     Status: None   Collection Time: 12/15/13  1:09 AM  Result Value Ref Range Status   Specimen Description ABSCESS RIGHT FOOT  Final   Special Requests MEDIAL PATIENT ON FOLLOWING ZOSYN VANCOMYCIN  Final   Gram Stain   Final    RARE WBC PRESENT,BOTH PMN AND MONONUCLEAR NO SQUAMOUS EPITHELIAL CELLS SEEN NO ORGANISMS SEEN Performed at [facility]    Culture    Final    NO ANAEROBES ISOLATED Performed at [facility]    Report Status 12/20/2013 FINAL  Final  Clostridium Difficile by PCR     Status: None   Collection Time: 12/16/13  5:34 PM  Result Value Ref Range Status   C difficile by pcr NEGATIVE NEGATIVE Final    Assessment: Severe persistent MRSA infection of right foot and probably causing superficial right buttock abscess.  Plan: 1. Continue vancomycin pending surgery on Tuesday, December 15  Michel Bickers, MD [facility] [phone] pager   [phone] cell 12/21/2013, 2:08 PM

## 2013-12-21 NOTE — Progress Notes (Addendum)
I met and examined Mr Silversmith. He complains of pain in his entire foot. He feels nauseous.   Blood pressure 190/96, pulse 99, temperature 98 F (36.7 C), temperature source Oral,  Resp. rate 18, height 6' 2"  (1.88 m), weight 366 lb 5.8 oz (166.18 kg), SpO2 96 %. Exam - same as yesterday. Abscess on buttock unchanged.  New ulcerated area on right foot as yesterday with pus. Foot warm.   Assessment and plan WBC trended down but remains high, likely there is osteomyelitis, and it could not be seen in MRI WO contrast. Dr Doran Durand considering left BKA. Given high INR, Vitamin K IV 5 mg and FFPs as needed before repeat surgery.  HgB back to 8 post transfusion. Patient tolerated the transfusion well.  Scr trending up - AKI likely due to infective process and dehydration.  No need to do albumin levels daily.

## 2013-12-21 NOTE — Progress Notes (Addendum)
ANTICOAGULATION CONSULT NOTE  Pharmacy Consult for Warfarin Indication: recurrent DVTs  No Known Allergies  Patient Measurements: Height: 6' 2" (188 cm) Weight: (!) 366 lb 5.8 oz (166.18 kg) IBW/kg (Calculated) : 82.2  Vital Signs: Temp: 98 F (36.7 C) (12/13 0900) Temp Source: Oral (12/13 0900) BP: 190/96 mmHg (12/13 0546) Pulse Rate: 99 (12/13 0900)  Labs:  Recent Labs  12/19/13 0421 12/20/13 0351 12/20/13 1957 12/21/13 0410  HGB 7.0* 6.9* 8.1* 8.3*  HCT 23.5* 22.3* 25.5* 26.3*  PLT 635* 624* 587* 690*  LABPROT 50.7* 57.1*  --  58.0*  INR 5.55* 6.45*  --  6.59*  CREATININE 1.98* 2.30*  --  2.35*    Estimated Creatinine Clearance: 70.5 mL/min (by C-G formula based on Cr of 2.35).  Assessment: 38yom admitted for cellulitis.    Has h/o recurrent DVTs. He is supposed to be on warfarin as outpt but is non-compliant. Per outpatient note last home regimen warfarin 7.37m daily and 1370mMon and Thur.  INR 1.28 on admission, received doses on 12/4 (7.70m28m 12/5 (70mg84mnd INR trended up to 6.23. Then received 2mg 12m12/8 and INR continues to increase- this morning INR is 6.59 LFTs are WNL with the exception of elevated alk phos. Unsure why INR continues to trend up despite not receiving any warfarin since 12/8. Receiving vitamin K 70mg I86m1 today per ortho recommendations.  Hgb 8.3 after transfusion yesterday, Plts elevated. No overt bleeding noted.  Goal of Therapy:  INR 2-3   Plan:  - No coumadin tonight - Daily INR - follow closely for s/s bleeding - getting vanc trough tomorrow morning at 0730   [name] D. Tomia Enlow, PharmD, BCPS Clinical Pharmacist Pager: [phone]/2015 10:50 AM

## 2013-12-22 ENCOUNTER — Encounter (HOSPITAL_COMMUNITY): Payer: Self-pay | Admitting: Anesthesiology

## 2013-12-22 ENCOUNTER — Inpatient Hospital Stay (HOSPITAL_COMMUNITY): Payer: Medicaid Other | Admitting: Anesthesiology

## 2013-12-22 ENCOUNTER — Encounter (HOSPITAL_COMMUNITY)
Admission: AD | Disposition: A | Discharge status: Rehabilitation Facility | Payer: Self-pay | Source: Ambulatory Visit | Attending: Internal Medicine

## 2013-12-22 DIAGNOSIS — E119 Type 2 diabetes mellitus without complications: Secondary | ICD-10-CM

## 2013-12-22 DIAGNOSIS — L0390 Cellulitis, unspecified: Secondary | ICD-10-CM

## 2013-12-22 DIAGNOSIS — L0291 Cutaneous abscess, unspecified: Secondary | ICD-10-CM | POA: Diagnosis present

## 2013-12-22 HISTORY — PX: INCISION AND DRAINAGE OF WOUND: SHX1803

## 2013-12-22 HISTORY — PX: AMPUTATION: SHX166

## 2013-12-22 LAB — CBC WITH DIFFERENTIAL/PLATELET
BASOS PCT: 0 % (ref 0–1)
Basophils Absolute: 0 10*3/uL (ref 0.0–0.1)
EOS ABS: 0.2 10*3/uL (ref 0.0–0.7)
EOS PCT: 1 % (ref 0–5)
HEMATOCRIT: 29.8 % — AB (ref 39.0–52.0)
HEMOGLOBIN: 9.4 g/dL — AB (ref 13.0–17.0)
Lymphocytes Relative: 7 % — ABNORMAL LOW (ref 12–46)
Lymphs Abs: 1.5 10*3/uL (ref 0.7–4.0)
MCH: 27.3 pg (ref 26.0–34.0)
MCHC: 31.5 g/dL (ref 30.0–36.0)
MCV: 86.6 fL (ref 78.0–100.0)
MONOS PCT: 8 % (ref 3–12)
Monocytes Absolute: 1.8 10*3/uL — ABNORMAL HIGH (ref 0.1–1.0)
NEUTROS PCT: 84 % — AB (ref 43–77)
Neutro Abs: 18.6 10*3/uL — ABNORMAL HIGH (ref 1.7–7.7)
Platelets: 804 10*3/uL — ABNORMAL HIGH (ref 150–400)
RBC: 3.44 MIL/uL — AB (ref 4.22–5.81)
RDW: 16.6 % — ABNORMAL HIGH (ref 11.5–15.5)
WBC: 22.1 10*3/uL — ABNORMAL HIGH (ref 4.0–10.5)

## 2013-12-22 LAB — PREPARE RBC (CROSSMATCH)

## 2013-12-22 LAB — GLUCOSE, CAPILLARY
GLUCOSE-CAPILLARY: 241 mg/dL — AB (ref 70–99)
Glucose-Capillary: 296 mg/dL — ABNORMAL HIGH (ref 70–99)
Glucose-Capillary: 221 mg/dL — ABNORMAL HIGH (ref 70–99)
Glucose-Capillary: 222 mg/dL — ABNORMAL HIGH (ref 70–99)
Glucose-Capillary: 206 mg/dL — ABNORMAL HIGH (ref 70–99)
Glucose-Capillary: 232 mg/dL — ABNORMAL HIGH (ref 70–99)

## 2013-12-22 LAB — COMPREHENSIVE METABOLIC PANEL
ALK PHOS: 246 U/L — AB (ref 39–117)
ALT: 21 U/L (ref 0–53)
AST: 16 U/L (ref 0–37)
Albumin: 2.2 g/dL — ABNORMAL LOW (ref 3.5–5.2)
Anion gap: 17 — ABNORMAL HIGH (ref 5–15)
BUN: 50 mg/dL — ABNORMAL HIGH (ref 6–23)
CO2: 18 meq/L — AB (ref 19–32)
Calcium: 9.1 mg/dL (ref 8.4–10.5)
Chloride: 104 mEq/L (ref 96–112)
Creatinine, Ser: 2.3 mg/dL — ABNORMAL HIGH (ref 0.50–1.35)
GFR calc non Af Amer: 35 mL/min — ABNORMAL LOW (ref >90)
GFR, EST AFRICAN AMERICAN: 40 mL/min — AB (ref >90)
Glucose, Bld: 309 mg/dL — ABNORMAL HIGH (ref 70–99)
Potassium: 4.4 mEq/L (ref 3.7–5.3)
SODIUM: 139 meq/L (ref 137–147)
TOTAL PROTEIN: 9.1 g/dL — AB (ref 6.0–8.3)
Total Bilirubin: 0.6 mg/dL (ref 0.3–1.2)

## 2013-12-22 LAB — PROTIME-INR
INR: 1.48 (ref 0.00–1.49)
Prothrombin Time: 18.1 seconds — ABNORMAL HIGH (ref 11.6–15.2)

## 2013-12-22 LAB — VANCOMYCIN, TROUGH: Vancomycin Tr: 27.2 ug/mL (ref 10.0–20.0)

## 2013-12-22 SURGERY — AMPUTATION BELOW KNEE
Anesthesia: General | Site: Leg Lower | Laterality: Right

## 2013-12-22 MED ORDER — HYDROMORPHONE HCL 1 MG/ML IJ SOLN
0.5000 mg | INTRAMUSCULAR | Status: DC | PRN
  Administered 2013-12-22 (×2): 0.5 mg via INTRAVENOUS

## 2013-12-22 MED ORDER — OXYCODONE HCL 5 MG PO TABS
5.0000 mg | ORAL_TABLET | Freq: Once | ORAL | Status: AC | PRN
  Administered 2013-12-22: 5 mg via ORAL

## 2013-12-22 MED ORDER — SODIUM CHLORIDE 0.9 % IV SOLN
INTRAVENOUS | Status: DC
  Administered 2013-12-22: 17:00:00 via INTRAVENOUS

## 2013-12-22 MED ORDER — HYDROMORPHONE HCL 1 MG/ML IJ SOLN
INTRAMUSCULAR | Status: DC
Start: 2013-12-22 — End: 2013-12-23
  Filled 2013-12-22: qty 1

## 2013-12-22 MED ORDER — GLYCOPYRROLATE 0.2 MG/ML IJ SOLN
INTRAMUSCULAR | Status: AC
  Filled 2013-12-22: qty 3

## 2013-12-22 MED ORDER — SENNA 8.6 MG PO TABS
1.0000 | ORAL_TABLET | Freq: Two times a day (BID) | ORAL | Status: DC
  Administered 2013-12-22 – 2013-12-26 (×8): 8.6 mg via ORAL
  Filled 2013-12-22 (×11): qty 1

## 2013-12-22 MED ORDER — SODIUM CHLORIDE 0.9 % IR SOLN
Status: DC | PRN
  Administered 2013-12-22: 3000 mL

## 2013-12-22 MED ORDER — SODIUM CHLORIDE 0.9 % IV SOLN
INTRAVENOUS | Status: DC | PRN
  Administered 2013-12-22 (×2): via INTRAVENOUS

## 2013-12-22 MED ORDER — SODIUM CHLORIDE 0.9 % IV SOLN
1250.0000 mg | INTRAVENOUS | Status: DC
  Administered 2013-12-23 – 2013-12-25 (×3): 1250 mg via INTRAVENOUS
  Filled 2013-12-22 (×4): qty 1250

## 2013-12-22 MED ORDER — SODIUM CHLORIDE 0.9 % IV SOLN
10.0000 mL/h | Freq: Once | INTRAVENOUS | Status: DC
Start: 2013-12-22 — End: 2013-12-22

## 2013-12-22 MED ORDER — 0.9 % SODIUM CHLORIDE (POUR BTL) OPTIME
TOPICAL | Status: DC | PRN
  Administered 2013-12-22: 2000 mL

## 2013-12-22 MED ORDER — FENTANYL CITRATE 0.05 MG/ML IJ SOLN
INTRAMUSCULAR | Status: AC
  Filled 2013-12-22: qty 5

## 2013-12-22 MED ORDER — ONDANSETRON HCL 4 MG/2ML IJ SOLN
INTRAMUSCULAR | Status: DC | PRN
  Administered 2013-12-22: 4 mg via INTRAVENOUS

## 2013-12-22 MED ORDER — PHENYLEPHRINE 40 MCG/ML (10ML) SYRINGE FOR IV PUSH (FOR BLOOD PRESSURE SUPPORT)
PREFILLED_SYRINGE | INTRAVENOUS | Status: AC
  Filled 2013-12-22: qty 20

## 2013-12-22 MED ORDER — FENTANYL CITRATE 0.05 MG/ML IJ SOLN
INTRAMUSCULAR | Status: AC
  Filled 2013-12-22: qty 2

## 2013-12-22 MED ORDER — METOCLOPRAMIDE HCL 5 MG PO TABS: 5.0000 mg | ORAL_TABLET | Freq: Three times a day (TID) | ORAL | Status: DC | PRN

## 2013-12-22 MED ORDER — HYDROMORPHONE HCL 1 MG/ML IJ SOLN
INTRAMUSCULAR | Status: AC
  Filled 2013-12-22: qty 1

## 2013-12-22 MED ORDER — SUCCINYLCHOLINE CHLORIDE 20 MG/ML IJ SOLN
INTRAMUSCULAR | Status: AC
  Filled 2013-12-22: qty 1

## 2013-12-22 MED ORDER — PHENYLEPHRINE HCL 10 MG/ML IJ SOLN
INTRAMUSCULAR | Status: DC | PRN
  Administered 2013-12-22 (×2): 80 ug via INTRAVENOUS

## 2013-12-22 MED ORDER — MIDAZOLAM HCL 2 MG/2ML IJ SOLN
INTRAMUSCULAR | Status: AC
Start: 2013-12-22 — End: 2013-12-22
  Filled 2013-12-22: qty 2

## 2013-12-22 MED ORDER — SUCCINYLCHOLINE CHLORIDE 20 MG/ML IJ SOLN
INTRAMUSCULAR | Status: DC | PRN
  Administered 2013-12-22: 140 mg via INTRAVENOUS

## 2013-12-22 MED ORDER — CHLORHEXIDINE GLUCONATE 4 % EX LIQD
60.0000 mL | Freq: Once | CUTANEOUS | Status: DC
  Filled 2013-12-22: qty 60

## 2013-12-22 MED ORDER — INSULIN GLARGINE 100 UNIT/ML ~~LOC~~ SOLN
30.0000 [IU] | Freq: Every day | SUBCUTANEOUS | Status: DC
  Administered 2013-12-22: 30 [IU] via SUBCUTANEOUS
  Filled 2013-12-22 (×3): qty 0.3

## 2013-12-22 MED ORDER — ONDANSETRON HCL 4 MG/2ML IJ SOLN
INTRAMUSCULAR | Status: AC
  Filled 2013-12-22: qty 2

## 2013-12-22 MED ORDER — METOCLOPRAMIDE HCL 5 MG/ML IJ SOLN: 5.0000 mg | Freq: Three times a day (TID) | INTRAMUSCULAR | Status: DC | PRN

## 2013-12-22 MED ORDER — FENTANYL CITRATE 0.05 MG/ML IJ SOLN
INTRAMUSCULAR | Status: DC | PRN
  Administered 2013-12-22 (×3): 50 ug via INTRAVENOUS
  Administered 2013-12-22: 100 ug via INTRAVENOUS
  Administered 2013-12-22 (×3): 50 ug via INTRAVENOUS
  Administered 2013-12-22: 100 ug via INTRAVENOUS

## 2013-12-22 MED ORDER — OXYCODONE HCL 5 MG/5ML PO SOLN: 5.0000 mg | Freq: Once | ORAL | Status: AC | PRN

## 2013-12-22 MED ORDER — BUPIVACAINE-EPINEPHRINE 0.5% -1:200000 IJ SOLN
INTRAMUSCULAR | Status: DC | PRN
  Administered 2013-12-22: 30 mL

## 2013-12-22 MED ORDER — WARFARIN - PHARMACIST DOSING INPATIENT
Freq: Every day | Status: DC
  Administered 2013-12-23: 18:00:00

## 2013-12-22 MED ORDER — INSULIN ASPART 100 UNIT/ML ~~LOC~~ SOLN
5.0000 [IU] | Freq: Once | SUBCUTANEOUS | Status: AC
  Administered 2013-12-22: 5 [IU] via SUBCUTANEOUS

## 2013-12-22 MED ORDER — ROCURONIUM BROMIDE 50 MG/5ML IV SOLN
INTRAVENOUS | Status: AC
  Filled 2013-12-22: qty 2

## 2013-12-22 MED ORDER — MIDAZOLAM HCL 5 MG/5ML IJ SOLN
INTRAMUSCULAR | Status: DC | PRN
  Administered 2013-12-22 (×2): 1 mg via INTRAVENOUS

## 2013-12-22 MED ORDER — NEOSTIGMINE METHYLSULFATE 10 MG/10ML IV SOLN
INTRAVENOUS | Status: AC
  Filled 2013-12-22: qty 1

## 2013-12-22 MED ORDER — NEOSTIGMINE METHYLSULFATE 10 MG/10ML IV SOLN
INTRAVENOUS | Status: DC | PRN
  Administered 2013-12-22: 5 mg via INTRAVENOUS

## 2013-12-22 MED ORDER — DOCUSATE SODIUM 100 MG PO CAPS
100.0000 mg | ORAL_CAPSULE | Freq: Two times a day (BID) | ORAL | Status: DC
  Administered 2013-12-22 – 2013-12-26 (×8): 100 mg via ORAL
  Filled 2013-12-22 (×8): qty 1

## 2013-12-22 MED ORDER — ONDANSETRON HCL 4 MG/2ML IJ SOLN: 4.0000 mg | Freq: Once | INTRAMUSCULAR | Status: DC | PRN

## 2013-12-22 MED ORDER — LIDOCAINE HCL (CARDIAC) 20 MG/ML IV SOLN
INTRAVENOUS | Status: DC | PRN
  Administered 2013-12-22: 50 mg via INTRAVENOUS

## 2013-12-22 MED ORDER — PROPOFOL 10 MG/ML IV BOLUS
INTRAVENOUS | Status: DC | PRN
  Administered 2013-12-22: 180 mg via INTRAVENOUS

## 2013-12-22 MED ORDER — HEPARIN (PORCINE) IN NACL 100-0.45 UNIT/ML-% IJ SOLN
1400.0000 [IU]/h | INTRAMUSCULAR | Status: DC
  Administered 2013-12-22: 1400 [IU]/h via INTRAVENOUS
  Filled 2013-12-22: qty 250

## 2013-12-22 MED ORDER — INSULIN ASPART 100 UNIT/ML ~~LOC~~ SOLN
SUBCUTANEOUS | Status: AC
  Administered 2013-12-22: 5 [IU] via SUBCUTANEOUS
  Filled 2013-12-22: qty 1

## 2013-12-22 MED ORDER — GLYCOPYRROLATE 0.2 MG/ML IJ SOLN
INTRAMUSCULAR | Status: DC | PRN
  Administered 2013-12-22: .7 mg via INTRAVENOUS

## 2013-12-22 MED ORDER — FENTANYL CITRATE 0.05 MG/ML IJ SOLN
25.0000 ug | INTRAMUSCULAR | Status: DC | PRN
  Administered 2013-12-22 (×3): 50 ug via INTRAVENOUS

## 2013-12-22 MED ORDER — ARTIFICIAL TEARS OP OINT
TOPICAL_OINTMENT | OPHTHALMIC | Status: AC
  Filled 2013-12-22: qty 3.5

## 2013-12-22 MED ORDER — ROCURONIUM BROMIDE 100 MG/10ML IV SOLN
INTRAVENOUS | Status: DC | PRN
  Administered 2013-12-22: 30 mg via INTRAVENOUS

## 2013-12-22 MED ORDER — OXYCODONE HCL 5 MG PO TABS
ORAL_TABLET | ORAL | Status: AC
  Filled 2013-12-22: qty 1

## 2013-12-22 MED ORDER — PROPOFOL 10 MG/ML IV BOLUS
INTRAVENOUS | Status: AC
Start: 2013-12-22 — End: 2013-12-22
  Filled 2013-12-22: qty 20

## 2013-12-22 MED ORDER — WARFARIN SODIUM 2 MG PO TABS
2.0000 mg | ORAL_TABLET | Freq: Once | ORAL | Status: DC
  Filled 2013-12-22: qty 1

## 2013-12-22 MED ORDER — ONDANSETRON HCL 4 MG/2ML IJ SOLN
4.0000 mg | Freq: Once | INTRAMUSCULAR | Status: AC
  Administered 2013-12-22: 4 mg via INTRAVENOUS

## 2013-12-22 MED ORDER — HYDROMORPHONE HCL 1 MG/ML IJ SOLN
0.5000 mg | INTRAMUSCULAR | Status: AC | PRN
  Administered 2013-12-22 (×4): 0.5 mg via INTRAVENOUS

## 2013-12-22 MED ORDER — SODIUM CHLORIDE 0.9 % IV SOLN
INTRAVENOUS | Status: DC
  Administered 2013-12-22 – 2013-12-25 (×5): via INTRAVENOUS

## 2013-12-22 SURGICAL SUPPLY — 86 items
BANDAGE ELASTIC 4 VELCRO ST LF (GAUZE/BANDAGES/DRESSINGS) ×3 IMPLANT
BANDAGE ELASTIC 6 VELCRO ST LF (GAUZE/BANDAGES/DRESSINGS) ×1 IMPLANT
BANDAGE ESMARK 6X9 LF (GAUZE/BANDAGES/DRESSINGS) ×2 IMPLANT
BLADE SAW RECIP 87.9 MT (BLADE) ×3 IMPLANT
BLADE SURG 10 STRL SS (BLADE) ×3 IMPLANT
BNDG CMPR 9X6 STRL LF SNTH (GAUZE/BANDAGES/DRESSINGS) ×2
BNDG COHESIVE 4X5 TAN STRL (GAUZE/BANDAGES/DRESSINGS) ×3 IMPLANT
BNDG COHESIVE 6X5 TAN STRL LF (GAUZE/BANDAGES/DRESSINGS) ×6 IMPLANT
BNDG CONFORM 3 STRL LF (GAUZE/BANDAGES/DRESSINGS) ×3 IMPLANT
BNDG ESMARK 6X9 LF (GAUZE/BANDAGES/DRESSINGS) ×3
BNDG GAUZE ELAST 4 BULKY (GAUZE/BANDAGES/DRESSINGS) ×3 IMPLANT
CANISTER SUCT 3000ML (MISCELLANEOUS) ×3 IMPLANT
CHLORAPREP W/TINT 26ML (MISCELLANEOUS) ×3 IMPLANT
CLIP TI MEDIUM 6 (CLIP) ×6 IMPLANT
COVER SURGICAL LIGHT HANDLE (MISCELLANEOUS) ×3 IMPLANT
CUFF TOURNIQUET SINGLE 34IN LL (TOURNIQUET CUFF) ×3 IMPLANT
CUFF TOURNIQUET SINGLE 44IN (TOURNIQUET CUFF) IMPLANT
DRAIN CHANNEL 10M FLAT 3/4 FLT (DRAIN) IMPLANT
DRAIN PENROSE 1/2X12 LTX STRL (WOUND CARE) IMPLANT
DRAPE EXTREMITY T 121X128X90 (DRAPE) ×3 IMPLANT
DRAPE INCISE IOBAN 66X45 STRL (DRAPES) ×3 IMPLANT
DRAPE PROXIMA HALF (DRAPES) ×6 IMPLANT
DRAPE U-SHAPE 47X51 STRL (DRAPES) ×6 IMPLANT
DRSG ADAPTIC 3X8 NADH LF (GAUZE/BANDAGES/DRESSINGS) ×3 IMPLANT
DRSG MEPITEL 4X7.2 (GAUZE/BANDAGES/DRESSINGS) ×3 IMPLANT
DRSG PAD ABDOMINAL 8X10 ST (GAUZE/BANDAGES/DRESSINGS) ×3 IMPLANT
ELECT CAUTERY BLADE 6.4 (BLADE) IMPLANT
ELECT REM PT RETURN 9FT ADLT (ELECTROSURGICAL) ×3
ELECTRODE REM PT RTRN 9FT ADLT (ELECTROSURGICAL) ×2 IMPLANT
EVACUATOR 1/8 PVC DRAIN (DRAIN) IMPLANT
EVACUATOR SILICONE 100CC (DRAIN) IMPLANT
GAUZE IODOFORM PACK 1/2 7832 (GAUZE/BANDAGES/DRESSINGS) ×1 IMPLANT
GAUZE SPONGE 4X4 12PLY STRL (GAUZE/BANDAGES/DRESSINGS) ×3 IMPLANT
GLOVE BIO SURGEON STRL SZ7 (GLOVE) ×6 IMPLANT
GLOVE BIO SURGEON STRL SZ8 (GLOVE) ×6 IMPLANT
GLOVE BIOGEL PI IND STRL 7.5 (GLOVE) ×2 IMPLANT
GLOVE BIOGEL PI IND STRL 8 (GLOVE) ×3 IMPLANT
GLOVE BIOGEL PI INDICATOR 7.5 (GLOVE) ×1
GLOVE BIOGEL PI INDICATOR 8 (GLOVE) ×2
GOWN STRL REUS W/ TWL LRG LVL3 (GOWN DISPOSABLE) ×2 IMPLANT
GOWN STRL REUS W/ TWL XL LVL3 (GOWN DISPOSABLE) ×2 IMPLANT
GOWN STRL REUS W/TWL LRG LVL3 (GOWN DISPOSABLE) ×3
GOWN STRL REUS W/TWL XL LVL3 (GOWN DISPOSABLE) ×3
IMMOBILIZER KNEE 22 (SOFTGOODS) ×3 IMPLANT
KIT BASIN OR (CUSTOM PROCEDURE TRAY) ×3 IMPLANT
KIT ROOM TURNOVER OR (KITS) ×3 IMPLANT
NEEDLE 22X1 1/2 (OR ONLY) (NEEDLE) ×3 IMPLANT
NS IRRIG 1000ML POUR BTL (IV SOLUTION) ×3 IMPLANT
PACK GENERAL/GYN (CUSTOM PROCEDURE TRAY) ×3 IMPLANT
PACK ORTHO EXTREMITY (CUSTOM PROCEDURE TRAY) ×3 IMPLANT
PAD ABD 8X10 STRL (GAUZE/BANDAGES/DRESSINGS) ×3 IMPLANT
PAD ARMBOARD 7.5X6 YLW CONV (MISCELLANEOUS) ×6 IMPLANT
PAD CAST 4YDX4 CTTN HI CHSV (CAST SUPPLIES) ×2 IMPLANT
PADDING CAST COTTON 4X4 STRL (CAST SUPPLIES) ×3
PADDING CAST COTTON 6X4 STRL (CAST SUPPLIES) ×3 IMPLANT
SET CYSTO W/LG BORE CLAMP LF (SET/KITS/TRAYS/PACK) ×3 IMPLANT
SOAP 2 % CHG 4 OZ (WOUND CARE) ×3 IMPLANT
SPONGE LAP 18X18 X RAY DECT (DISPOSABLE) ×6 IMPLANT
SPONGE LAP 4X18 X RAY DECT (DISPOSABLE) ×3 IMPLANT
STAPLER VISISTAT 35W (STAPLE) IMPLANT
STOCKINETTE IMPERVIOUS LG (DRAPES) IMPLANT
SUCTION FRAZIER TIP 10 FR DISP (SUCTIONS) ×3 IMPLANT
SUT ETHILON 3 0 FSL (SUTURE) IMPLANT
SUT MNCRL AB 3-0 PS2 18 (SUTURE) ×3 IMPLANT
SUT MON AB 2-0 CT1 36 (SUTURE) ×9 IMPLANT
SUT PDS 2 0 (SUTURE) IMPLANT
SUT PDS AB 0 CT 36 (SUTURE) ×9 IMPLANT
SUT PROLENE 3 0 PS 2 (SUTURE) ×3 IMPLANT
SUT PROLENE 3 0 SH 48 (SUTURE) IMPLANT
SUT SILK 2 0 (SUTURE) ×3
SUT SILK 2-0 18XBRD TIE 12 (SUTURE) ×2 IMPLANT
SUT VIC AB 2-0 CT1 27 (SUTURE) ×6
SUT VIC AB 2-0 CT1 TAPERPNT 27 (SUTURE) ×4 IMPLANT
SUT VIC AB 3-0 PS2 18 (SUTURE) ×3
SUT VIC AB 3-0 PS2 18XBRD (SUTURE) ×2 IMPLANT
SWAB COLLECTION DEVICE MRSA (MISCELLANEOUS) IMPLANT
SYR CONTROL 10ML LL (SYRINGE) ×3 IMPLANT
TAPE CLOTH SURG 6X10 WHT LF (GAUZE/BANDAGES/DRESSINGS) ×1 IMPLANT
TOWEL OR 17X24 6PK STRL BLUE (TOWEL DISPOSABLE) ×3 IMPLANT
TOWEL OR 17X26 10 PK STRL BLUE (TOWEL DISPOSABLE) ×3 IMPLANT
TUBE ANAEROBIC SPECIMEN COL (MISCELLANEOUS) IMPLANT
TUBE CONNECTING 12X1/4 (SUCTIONS) ×3 IMPLANT
TUBING CYSTO DISP (UROLOGICAL SUPPLIES) ×6 IMPLANT
TUBING SUCTION BULK 100 FT (MISCELLANEOUS) ×6 IMPLANT
WATER STERILE IRR 1000ML POUR (IV SOLUTION) ×3 IMPLANT
YANKAUER SUCT BULB TIP NO VENT (SUCTIONS) ×3 IMPLANT

## 2013-12-22 NOTE — Transfer of Care (Signed)
Immediate Anesthesia Transfer of Care Note  Patient: [name]  Procedure(s) Performed: Procedure(s): AMPUTATION BELOW KNEE (Right) I&D RIGHT BUTTOCK (Right)  Patient Location: PACU  Anesthesia Type:General  Level of Consciousness: awake, alert  and oriented  Airway & Oxygen Therapy: Patient Spontanous Breathing and Patient connected to face mask oxygen  Post-op Assessment: Report given to PACU RN and Post -op Vital signs reviewed and stable  Post vital signs: Reviewed and stable  Complications: No apparent anesthesia complications

## 2013-12-22 NOTE — Brief Op Note (Signed)
12/09/2013 - 12/22/2013  7:34 PM  PATIENT:  [name]  38 y.o. male  PRE-OPERATIVE DIAGNOSIS: 1.  Right buttock abscess      2.  Right foot abscesses and gangrene  POST-OPERATIVE DIAGNOSIS:  Same  Procedure(s): 1.  Right below knee amputation 2.  Irrigation and excisional debridement of right buttock abscess  SURGEON:  [name], MD  ASSISTANT: n/a  ANESTHESIA:   General  EBL:  450 cc   TOURNIQUET:   Total Tourniquet Time Documented: Thigh (Right) - 7 minutes Thigh (Right) - 18 minutes Total: Thigh (Right) - 25 minutes   COMPLICATIONS:  None apparent  DISPOSITION:  Extubated, awake and stable to recovery.  DICTATION ID:  [id number]

## 2013-12-22 NOTE — Progress Notes (Signed)
Dr. Linna Caprice notified of CBG of 221 at 1615 orders received.

## 2013-12-22 NOTE — Progress Notes (Signed)
[place] for Warfarin Indication: recurrent DVTs  No Known Allergies  Patient Measurements: Height: _0  (188 cm) Weight: (!) 366 lb 5.8 oz (166.18 kg) IBW/kg (Calculated) : 82.2  Vital Signs: Temp: 98.2 F (36.8 C) (12/14 2146) Temp Source: Oral (12/14 2146) BP: 176/92 mmHg (12/14 2146) Pulse Rate: 106 (12/14 2146)  Labs:  Recent Labs  12/20/13 0351 12/20/13 1957 12/21/13 0410 12/22/13 0454  HGB 6.9* 8.1* 8.3* 9.4*  HCT 22.3* 25.5* 26.3* 29.8*  PLT 624* 587* 690* 804*  LABPROT 57.1*  --  58.0* 18.1*  INR 6.45*  --  6.59* 1.48  CREATININE 2.30*  --  2.35* 2.30*    Estimated Creatinine Clearance: 72 mL/min (by C-G formula based on Cr of 2.3).  Assessment: 38yom admitted for cellulitis.    Has h/o recurrent DVTs. He is supposed to be on warfarin as outpt but is non-compliant. Per outpatient note last home regimen warfarin 7.7m daily and 124mMon and Thur.  INR 1.28 on admission, received doses on 12/4 (7.46m96m 12/5 (46mg81mnd INR trended up to 6.59  LFTs are WNL with the exception of elevated alk phos.  Receiving vitamin K 46mg 70mx1 yesterday  per ortho recommendations.  INR 1.48  -s/p R BKA for foot abcess and gangrene  Restart warfarin tonight post op  Goal of Therapy:  INR 2-3   Plan:  -  Coumadin 2mg t61mght - Daily INR - follow closely for s/s bleeding    Thara Searing CBonnita Nasuti.D. CPP, BCPS Clinical Pharmacist [phone]/2015 10:00 PM

## 2013-12-22 NOTE — Progress Notes (Signed)
Subjective: No acute events overnight. He feels less nervous this morning about having the amputation. He denies CP, SOB. Still having nausea and abdominal pain.   Objective: Vital signs in last 24 hours: Filed Vitals:   12/21/13 1700 12/21/13 2143 12/22/13 0211 12/22/13 0458  BP:  185/89 175/80 186/109  Pulse:  109 108 117  Temp:   98 F (36.7 C) 98 F (36.7 C)  TempSrc:   Oral Oral  Resp:   18 20  Height:      Weight: 366 lb 5.8 oz (166.18 kg)     SpO2:  90% 95% 92%   Weight change: 0 lb (0 kg)  Intake/Output Summary (Last 24 hours) at 12/22/13 1048 Last data filed at 12/22/13 0500  Gross per 24 hour  Intake   1260 ml  Output    850 ml  Net    410 ml   General: NAD HEENT: [place]/AT Cardiac: RRR, no rubs, murmurs or gallops Pulm: clear to auscultation bilaterally, moving normal volumes of air Abd: soft, + BS, nondistended, TTP Ext: +1 B/L LE edema, Dressing in place on R foot, minimally TTP, superficial wound 2x1cm on plantar surface just beneath 3rd toe that does not have drainage or erythema or warmth. 5cm area of induration on R posterior hip with warmth. No drainage. Neuro: alert and oriented X3, responding appropriately, follows commands  Lab Results: Basic Metabolic Panel:  Recent Labs Lab 12/21/13 0410 12/22/13 0454  NA 138 139  K 4.8 4.4  CL 103 104  CO2 15* 18*  GLUCOSE 308* 309*  BUN 52* 50*  CREATININE 2.35* 2.30*  CALCIUM 8.9 9.1   Liver Function Tests:  Recent Labs Lab 12/21/13 0410 12/22/13 0454  AST 18 16  ALT 22 21  ALKPHOS 316* 246*  BILITOT 0.6 0.6  PROT 8.2 9.1*  ALBUMIN 2.0* 2.2*   CBC:  Recent Labs Lab 12/21/13 0410 12/22/13 0454  WBC 23.5* 22.1*  NEUTROABS 19.8* 18.6*  HGB 8.3* 9.4*  HCT 26.3* 29.8*  MCV 85.1 86.6  PLT 690* 804*   CBG:  Recent Labs Lab 12/20/13 2118 12/21/13 0725 12/21/13 1134 12/21/13 1738 12/21/13 2151 12/22/13 0745  GLUCAP 238* 298* 284* 273* 237* 296*   Coagulation:  Recent Labs Lab  12/19/13 0421 12/20/13 0351 12/21/13 0410 12/22/13 0454  LABPROT 50.7* 57.1* 58.0* 18.1*  INR 5.55* 6.45* 6.59* 1.48   Urine Drug Screen: Drugs of Abuse     Component Value Date/Time   LABOPIA POSITIVE* 07/12/2011 0038   COCAINSCRNUR NONE DETECTED 07/12/2011 0038   LABBENZ NONE DETECTED 07/12/2011 0038   AMPHETMU NONE DETECTED 07/12/2011 0038   THCU NONE DETECTED 07/12/2011 0038   LABBARB NONE DETECTED 07/12/2011 0038    Micro Results: Recent Results (from the past 240 hour(s))  Culture, blood (routine x 2)     Status: None   Collection Time: 12/12/13  7:03 PM  Result Value Ref Range Status   Specimen Description BLOOD RIGHT ARM  Final   Special Requests BOTTLES DRAWN AEROBIC AND ANAEROBIC 10CC  Final   Culture  Setup Time   Final    12/13/2013 01:04 Performed at [facility]    Culture   Final    NO GROWTH 5 DAYS Performed at [facility]    Report Status 12/19/2013 FINAL  Final  Culture, blood (routine x 2)     Status: None   Collection Time: 12/12/13  7:15 PM  Result Value Ref Range Status   Specimen  Description BLOOD LEFT HAND  Final   Special Requests BOTTLES DRAWN AEROBIC AND ANAEROBIC 10CC  Final   Culture  Setup Time   Final    12/13/2013 01:04 Performed at [facility]    Culture   Final    NO GROWTH 5 DAYS Performed at [facility]    Report Status 12/19/2013 FINAL  Final  Culture, blood (routine x 2)     Status: None   Collection Time: 12/13/13  8:47 PM  Result Value Ref Range Status   Specimen Description BLOOD RIGHT ARM  Final   Special Requests BOTTLES DRAWN AEROBIC ONLY 10CC  Final   Culture  Setup Time   Final    12/14/2013 03:54 Performed at [facility]    Culture   Final    NO GROWTH 5 DAYS Performed at [facility]    Report Status 12/20/2013 FINAL  Final  Culture, blood (routine x 2)     Status: None   Collection Time: 12/13/13  9:03 PM  Result Value Ref Range Status   Specimen  Description BLOOD RIGHT ARM  Final   Special Requests BOTTLES DRAWN AEROBIC ONLY 2CC  Final   Culture  Setup Time   Final    12/14/2013 03:54 Performed at [facility]    Culture   Final    NO GROWTH 5 DAYS Performed at [facility]    Report Status 12/20/2013 FINAL  Final  Culture, Urine     Status: None   Collection Time: 12/14/13 12:15 AM  Result Value Ref Range Status   Specimen Description URINE, CLEAN CATCH  Final   Special Requests NONE  Final   Culture  Setup Time   Final    12/14/2013 01:11 Performed at [place] Performed at [facility]   Final   Culture NO GROWTH Performed at [facility]   Final   Report Status 12/15/2013 FINAL  Final  Surgical pcr screen     Status: Abnormal   Collection Time: 12/15/13 12:00 AM  Result Value Ref Range Status   MRSA, PCR NEGATIVE NEGATIVE Final   Staphylococcus aureus POSITIVE (A) NEGATIVE Final    Comment:        The Xpert SA Assay (FDA approved for NASAL specimens in patients over 45 years of age), is one component of a comprehensive surveillance program.  Test performance has been validated by [employer] for patients greater than or equal to 61 year old. It is not intended to diagnose infection nor to guide or monitor treatment. RESULT CALLED TO, READ BACK BY AND VERIFIED WITH: C OGIN AJALA,RN 12/15/13 0226 BY RHOLMES   Culture, routine-abscess     Status: None   Collection Time: 12/15/13  1:09 AM  Result Value Ref Range Status   Specimen Description ABSCESS RIGHT FOOT  Final   Special Requests MEDIAL PATIENT ON FOLLOWING ZOSYN VANCOMYCIN  Final   Gram Stain   Final    RARE WBC PRESENT,BOTH PMN AND MONONUCLEAR NO SQUAMOUS EPITHELIAL CELLS SEEN NO ORGANISMS SEEN Performed at [facility]    Culture   Final    NO GROWTH 3 DAYS Performed at [facility]    Report Status 12/18/2013 FINAL  Final  Anaerobic culture     Status:  None   Collection Time: 12/15/13  1:09 AM  Result Value Ref Range Status   Specimen Description ABSCESS RIGHT FOOT  Final   Special Requests MEDIAL PATIENT ON FOLLOWING ZOSYN VANCOMYCIN  Final   Gram Stain   Final    RARE WBC PRESENT,BOTH PMN AND MONONUCLEAR NO SQUAMOUS EPITHELIAL CELLS SEEN NO ORGANISMS SEEN Performed at [facility]    Culture   Final    NO ANAEROBES ISOLATED Performed at [facility]    Report Status 12/20/2013 FINAL  Final  Clostridium Difficile by PCR     Status: None   Collection Time: 12/16/13  5:34 PM  Result Value Ref Range Status   C difficile by pcr NEGATIVE NEGATIVE Final   Studies/Results: US Abdomen Limited  12/21/2013   CLINICAL DATA:  Increased alkaline phosphatase. Nausea and vomiting for a week.  EXAM: US ABDOMEN LIMITED - RIGHT UPPER QUADRANT  COMPARISON:  CT abdomen and pelvis 12/19/2013  FINDINGS: Technically limited examination due to patient's body habitus.  Gallbladder:  Gallbladder is somewhat contracted with diffusely increased echoes pattern centrally consistent with small stones and sludge. Borderline gallbladder wall thickness at 3.5 mm. Murphy's sign is negative.  Common bile duct:  Diameter: 7.2 mm, upper limits of normal.  Liver:  Diffusely increased parenchymal echotexture suggesting fatty infiltration. No focal liver lesions appreciated.  IMPRESSION: Mild gallbladder contraction. Gallbladder is filled with sludge and small stones. Murphy's sign is negative.   Electronically Signed   By: Lucienne Capers M.D.   On: 12/21/2013 03:04   Medications: I have reviewed the patient's current medications. Scheduled Meds: . antiseptic oral rinse  7 mL Mouth Rinse q12n4p  . chlorhexidine  15 mL Mouth Rinse BID  . feeding supplement (GLUCERNA SHAKE)  237 mL Oral BID BM  . insulin aspart  0-15 Units Subcutaneous TID WC  . insulin glargine  30 Units Subcutaneous QHS  . pravastatin  20 mg Oral q1800  . vancomycin  2,000 mg  Intravenous Q24H   Continuous Infusions: . dextrose 5 % and 0.45% NaCl 75 mL/hr at 12/22/13 0334   PRN Meds:.acetaminophen **OR** acetaminophen, dextrose, HYDROmorphone (DILAUDID) injection, labetalol, menthol-cetylpyridinium, ondansetron (ZOFRAN) IV, oxyCODONE-acetaminophen, promethazine, senna-docusate   Assessment/Plan:  Diabetic foot ulcer and abscess, right foot with sepsis:  S/p Bedside I&D 12/11/2013.  Wound cx growing few methicillin resistant staph aureus.  S/p repeat I&D 12/14/2013. No organisms seen on repeat wound culture. D/c'd zosyn on day 9. Remains on IV vanc. Persistent leukocytosis, though decreasing slowly in recent days. Remains afebrile.  Appreciate orthopedics and ID recommendations/interventions.  Ortho feels that given progression of infection despite two I&Ds and IV vancomycin, the patient will likely need an amputation to cure the infection.   - continue IV Vancomycin (day 14) - Percocet 5/370m 1-2 tab q4hr prn pain  - Continue IV dilaudid 1943mq3h prn - INR 1.48 from 6.59 after vitamin K yesterday. OR this afternoon with orthopedics. NPO and hold anticoagulation.  R posterior hip cellulitis with focal 43m78mluid collection in superficial subcutaneous fat.  Ortho plans to I&D this area at the same time of right foot BKA. - continue to monitor  Diarrhea, abdominal pain, nausea/vomiting: C diff PCR negative Likely 2/2 antibiotic associated diarrhea. Diarrhea has resolved. CT abd pelvis with no acute findings. LFTs wnl except elevated Alk phos to 2 times the normal limit. GGT elevated indicating likely hepatic origin. Differential includes chronic cholestatic (partial bile duct obstruction, primary biliary cholangitis (PBC), primary sclerosing cholangitis) or infiltrative liver diseases (sarcoidosis, other granulomatous diseases, amyloidosis).  RUQ US Koreamild GB contraction, GB filled with sludge and small stones, murphy's  sign negative, borderline GB wall thickness of 3.1m, no  focal liver lesions.   - continue to monitor  - IV phenergan 252mq6h prn  - pain control as above  - D5 1/2 NS @ 75cc/hr  Acute on chronic anemia: Iron deficiency anemia. Ferritin elevated - likely acute phase reactant. Hgb stable post-transfusion.  - Check FOBT  - Continue to monitor CBC  Metabolic acidosis with increased anion gap: Likely 2/2 starvation as he has had poor PO intake. Lactic acid wnl. No Ketones on UA.  - D5 1/2 NS @ 75cc/hr  - Continue to monitor  Suspected Diabetic ketoacidosis secondary to type 2 diabetes: Resolved.  Novolog 70/30 d/c due to patient with poor po and a few lows in the 60s.  CBG in the 200s yesterday and overnight.  - SSI mod, CBG q AC/HS  - Increase Lantus 30u qHS; need better glucose control in this patient with wound infx and pending OR, careful increase due to poor po and prior low CBGs. - continue D5 1/2 NS  AKI on CKD stage 2: Cr stable at 2.3 from 2.35 (baseline around 1.6). Lasix was resumed when his weight increased and Cr was starting to improve.   - D5 1/2 NS @ 75cc/hr  - strict I&Os   Hypertension:   - Holding home lisinopril-hydrochlorothiazide in the setting of AKI  - Hold home lasix 4017maily as above  - labetalol prn for SBP >180  FEN:   - NPO  - D5 1/2 NS @ 75cc/hr   Hx Recurrent DVTs, DVT ppx: SCDs, warfarin (on hold due to supratherapeutic INR and in anticipation of OR)  Dispo: Disposition is deferred at this time, awaiting improvement of current medical problems.  Anticipated discharge in approximately 1-2 day(s).   The patient does have a current PCP ([facility]) [facility].  The patient does not have transportation limitations that hinder transportation to clinic appointments.  .Services Needed at time of discharge: Y = Yes, Blank = No PT: Rolling walker with 5" wheels, 3 in 1  OT: 24hr assistance  RN:   Equipment:   Other:     LOS: 13 days    JenJacques EarthlyD 12/22/2013, 10:48 AM

## 2013-12-22 NOTE — Progress Notes (Signed)
Subjective: 8 Days Post-Op Procedure(s) (LRB): IRRIGATION AND DEBRIDEMENT RIGHT FOOT (Right) Patient reports pain as mild.  Still with nausea and vomiting yesterday.  Vitamin K administered yesterday for elevated INR.    Objective: Vital signs in last 24 hours: Temp:  [98 F (36.7 C)] 98 F (36.7 C) (12/14 0458) Pulse Rate:  [99-117] 117 (12/14 0458) Resp:  [18-20] 20 (12/14 0458) BP: (175-186)/(80-109) 186/109 mmHg (12/14 0458) SpO2:  [90 %-96 %] 92 % (12/14 0458) Weight:  [166.18 kg (366 lb 5.8 oz)] 166.18 kg (366 lb 5.8 oz) (12/13 1700)  Intake/Output from previous day: 12/13 0701 - 12/14 0700 In: 1260 [P.O.:360; I.V.:900] Out: 850 [Urine:850] Intake/Output this shift:     Recent Labs  12/20/13 0351 12/20/13 1957 12/21/13 0410 12/22/13 0454  HGB 6.9* 8.1* 8.3* 9.4*    Recent Labs  12/21/13 0410 12/22/13 0454  WBC 23.5* 22.1*  RBC 3.09* 3.44*  HCT 26.3* 29.8*  PLT 690* 804*    Recent Labs  12/21/13 0410 12/22/13 0454  NA 138 139  K 4.8 4.4  CL 103 104  CO2 15* 18*  BUN 52* 50*  CREATININE 2.35* 2.30*  GLUCOSE 308* 309*  CALCIUM 8.9 9.1    Recent Labs  12/21/13 0410 12/22/13 0454  INR 6.59* 1.48    PE:  obese male in nad.  A and O x4.  R foot with multiple draining abscesses about the forefoot.  Brisk cap refill at toes.  Assessment/Plan: 8 Days Post-Op Procedure(s) (LRB): IRRIGATION AND DEBRIDEMENT RIGHT FOOT (Right)  His INR is normal after vitamin K.  He will require transmet or BK amputation on the right to effectively treat this refractory infection.  He understands the risks and benefits of the alternative treatment options and elects to proceed.  He's on the OR schedule for this afternoon as an add-on.  NPO until then.  Hold blood thinners.  [name] 12/22/2013, 7:33 AM

## 2013-12-22 NOTE — Anesthesia Postprocedure Evaluation (Signed)
  Anesthesia Post-op Note  Patient: [name]  Procedure(s) Performed: Procedure(s): IRRIGATION AND DEBRIDEMENT RIGHT FOOT (Right)  Patient Location: PACU  Anesthesia Type:General  Level of Consciousness: awake and alert   Airway and Oxygen Therapy: Patient Spontanous Breathing  Post-op Pain: none  Post-op Assessment: Post-op Vital signs reviewed  Post-op Vital Signs: stable  Last Vitals:  Filed Vitals:   12/22/13 0458  BP: 186/109  Pulse: 117  Temp: 36.7 C  Resp: 20    Complications: No apparent anesthesia complications

## 2013-12-22 NOTE — Progress Notes (Signed)
Patient reports vomiting/ nausea. Dr. Linna Caprice notified orders received.

## 2013-12-22 NOTE — Progress Notes (Signed)
PT Cancellation Note  Patient Details Name: KRISHAWN BUONOMO MRN: [id number] DOB: [birth year]   Cancelled Treatment:    Reason Eval/Treat Not Completed: Medical issues which prohibited therapy.  Pt having either R transmet amp or BKA this afternoon.  Pt declined OOB this morning, but this PT states that we will follow up after surgery tomorrow.  May need MD to re-order given status change.     Denice Bors 12/22/2013, 11:25 AM

## 2013-12-22 NOTE — Anesthesia Postprocedure Evaluation (Signed)
Anesthesia Post Note  Patient: [name]  Procedure(s) Performed: Procedure(s) (LRB): AMPUTATION BELOW KNEE (Right) I&D RIGHT BUTTOCK (Right)  Anesthesia type: General  Patient location: PACU  Post pain: Pain level controlled and Adequate analgesia  Post assessment: Post-op Vital signs reviewed, Patient's Cardiovascular Status Stable, Respiratory Function Stable, Patent Airway and Pain level controlled  Last Vitals:  Filed Vitals:   12/22/13 2115  BP: 175/102  Pulse: 103  Temp:   Resp: 14    Post vital signs: Reviewed and stable  Level of consciousness: awake, alert  and oriented  Complications: No apparent anesthesia complications

## 2013-12-22 NOTE — Progress Notes (Signed)
INFECTIOUS DISEASE PROGRESS NOTE  ID: [name] is a 38 y.o. male with  Principal Problem:   Cellulitis and abscess of right foot Active Problems:   DM (diabetes mellitus) type I uncontrolled, periph vascular disorder   Morbid obesity   DEEP VENOUS THROMBOPHLEBITIS, HX OF   Foot ulcer, right   Essential hypertension   Hyperlipidemia   CKD (chronic kidney disease) stage 2, GFR 60-89 ml/min   GERD (gastroesophageal reflux disease)   Leukocytosis   Supratherapeutic INR   Abscess of right hip  Subjective: "mixed emotions" Awaiting surgery  Abtx:  Anti-infectives    Start     Dose/Rate Route Frequency Ordered Stop   12/23/13 1200  vancomycin (VANCOCIN) 1,250 mg in sodium chloride 0.9 % 250 mL IVPB     1,250 mg166.7 mL/hr over 90 Minutes Intravenous Every 24 hours 12/22/13 1119     12/13/13 0800  vancomycin (VANCOCIN) 2,000 mg in sodium chloride 0.9 % 500 mL IVPB  Status:  Discontinued     2,000 mg250 mL/hr over 120 Minutes Intravenous Every 24 hours 12/12/13 1022 12/22/13 1119   12/10/13 0800  vancomycin (VANCOCIN) 1,500 mg in sodium chloride 0.9 % 500 mL IVPB  Status:  Discontinued     1,500 mg250 mL/hr over 120 Minutes Intravenous Every 12 hours 12/09/13 1905 12/12/13 1022   12/09/13 2200  piperacillin-tazobactam (ZOSYN) IVPB 3.375 g  Status:  Discontinued     3.375 g12.5 mL/hr over 240 Minutes Intravenous 3 times per day 12/09/13 1905 12/17/13 0719   12/09/13 2000  vancomycin (VANCOCIN) 2,500 mg in sodium chloride 0.9 % 500 mL IVPB     2,500 mg250 mL/hr over 120 Minutes Intravenous  Once 12/09/13 1905 12/09/13 2353      Medications:  Scheduled: . antiseptic oral rinse  7 mL Mouth Rinse q12n4p  . chlorhexidine  15 mL Mouth Rinse BID  . feeding supplement (GLUCERNA SHAKE)  237 mL Oral BID BM  . insulin aspart  0-15 Units Subcutaneous TID WC  . insulin glargine  30 Units Subcutaneous QHS  . pravastatin  20 mg Oral q1800  . [START ON 12/23/2013] vancomycin  1,250  mg Intravenous Q24H    Objective: Vital signs in last 24 hours: Temp:  [98 F (36.7 C)-98.1 F (36.7 C)] 98.1 F (36.7 C) (12/14 1318) Pulse Rate:  [98-117] 98 (12/14 1318) Resp:  [18-20] 20 (12/14 1318) BP: (155-186)/(80-109) 155/81 mmHg (12/14 1318) SpO2:  [90 %-95 %] 94 % (12/14 1318) Weight:  [166.18 kg (366 lb 5.8 oz)] 166.18 kg (366 lb 5.8 oz) (12/13 1700)   General appearance: alert, cooperative and no distress Extremities: R foot wrapped.   Lab Results  Recent Labs  12/21/13 0410 12/22/13 0454  WBC 23.5* 22.1*  HGB 8.3* 9.4*  HCT 26.3* 29.8*  NA 138 139  K 4.8 4.4  CL 103 104  CO2 15* 18*  BUN 52* 50*  CREATININE 2.35* 2.30*   Liver Panel  Recent Labs  12/21/13 0410 12/22/13 0454  PROT 8.2 9.1*  ALBUMIN 2.0* 2.2*  AST 18 16  ALT 22 21  ALKPHOS 316* 246*  BILITOT 0.6 0.6   Sedimentation Rate No results for input(s): ESRSEDRATE in the last 72 hours. C-Reactive Protein No results for input(s): CRP in the last 72 hours.  Microbiology: Recent Results (from the past 240 hour(s))  Culture, blood (routine x 2)     Status: None   Collection Time: 12/12/13  7:03 PM  Result Value Ref Range  Status   Specimen Description BLOOD RIGHT ARM  Final   Special Requests BOTTLES DRAWN AEROBIC AND ANAEROBIC 10CC  Final   Culture  Setup Time   Final    12/13/2013 01:04 Performed at [facility]    Culture   Final    NO GROWTH 5 DAYS Performed at [facility]    Report Status 12/19/2013 FINAL  Final  Culture, blood (routine x 2)     Status: None   Collection Time: 12/12/13  7:15 PM  Result Value Ref Range Status   Specimen Description BLOOD LEFT HAND  Final   Special Requests BOTTLES DRAWN AEROBIC AND ANAEROBIC 10CC  Final   Culture  Setup Time   Final    12/13/2013 01:04 Performed at [facility]    Culture   Final    NO GROWTH 5 DAYS Performed at [facility]    Report Status 12/19/2013 FINAL  Final  Culture, blood  (routine x 2)     Status: None   Collection Time: 12/13/13  8:47 PM  Result Value Ref Range Status   Specimen Description BLOOD RIGHT ARM  Final   Special Requests BOTTLES DRAWN AEROBIC ONLY 10CC  Final   Culture  Setup Time   Final    12/14/2013 03:54 Performed at [facility]    Culture   Final    NO GROWTH 5 DAYS Performed at [facility]    Report Status 12/20/2013 FINAL  Final  Culture, blood (routine x 2)     Status: None   Collection Time: 12/13/13  9:03 PM  Result Value Ref Range Status   Specimen Description BLOOD RIGHT ARM  Final   Special Requests BOTTLES DRAWN AEROBIC ONLY 2CC  Final   Culture  Setup Time   Final    12/14/2013 03:54 Performed at [facility]    Culture   Final    NO GROWTH 5 DAYS Performed at [facility]    Report Status 12/20/2013 FINAL  Final  Culture, Urine     Status: None   Collection Time: 12/14/13 12:15 AM  Result Value Ref Range Status   Specimen Description URINE, CLEAN CATCH  Final   Special Requests NONE  Final   Culture  Setup Time   Final    12/14/2013 01:11 Performed at [place] Performed at [facility]   Final   Culture NO GROWTH Performed at [facility]   Final   Report Status 12/15/2013 FINAL  Final  Surgical pcr screen     Status: Abnormal   Collection Time: 12/15/13 12:00 AM  Result Value Ref Range Status   MRSA, PCR NEGATIVE NEGATIVE Final   Staphylococcus aureus POSITIVE (A) NEGATIVE Final    Comment:        The Xpert SA Assay (FDA approved for NASAL specimens in patients over 16 years of age), is one component of a comprehensive surveillance program.  Test performance has been validated by [employer] for patients greater than or equal to 28 year old. It is not intended to diagnose infection nor to guide or monitor treatment. RESULT CALLED TO, READ BACK BY AND VERIFIED WITH: C OGIN AJALA,RN 12/15/13 0226 BY RHOLMES     Culture, routine-abscess     Status: None   Collection Time: 12/15/13  1:09 AM  Result Value Ref Range Status   Specimen Description ABSCESS RIGHT FOOT  Final  Special Requests MEDIAL PATIENT ON FOLLOWING ZOSYN VANCOMYCIN  Final   Gram Stain   Final    RARE WBC PRESENT,BOTH PMN AND MONONUCLEAR NO SQUAMOUS EPITHELIAL CELLS SEEN NO ORGANISMS SEEN Performed at [facility]    Culture   Final    NO GROWTH 3 DAYS Performed at [facility]    Report Status 12/18/2013 FINAL  Final  Anaerobic culture     Status: None   Collection Time: 12/15/13  1:09 AM  Result Value Ref Range Status   Specimen Description ABSCESS RIGHT FOOT  Final   Special Requests MEDIAL PATIENT ON FOLLOWING ZOSYN VANCOMYCIN  Final   Gram Stain   Final    RARE WBC PRESENT,BOTH PMN AND MONONUCLEAR NO SQUAMOUS EPITHELIAL CELLS SEEN NO ORGANISMS SEEN Performed at [facility]    Culture   Final    NO ANAEROBES ISOLATED Performed at [facility]    Report Status 12/20/2013 FINAL  Final  Clostridium Difficile by PCR     Status: None   Collection Time: 12/16/13  5:34 PM  Result Value Ref Range Status   C difficile by pcr NEGATIVE NEGATIVE Final    Studies/Results: US Abdomen Limited  12/21/2013   CLINICAL DATA:  Increased alkaline phosphatase. Nausea and vomiting for a week.  EXAM: US ABDOMEN LIMITED - RIGHT UPPER QUADRANT  COMPARISON:  CT abdomen and pelvis 12/19/2013  FINDINGS: Technically limited examination due to patient's body habitus.  Gallbladder:  Gallbladder is somewhat contracted with diffusely increased echoes pattern centrally consistent with small stones and sludge. Borderline gallbladder wall thickness at 3.5 mm. Murphy's sign is negative.  Common bile duct:  Diameter: 7.2 mm, upper limits of normal.  Liver:  Diffusely increased parenchymal echotexture suggesting fatty infiltration. No focal liver lesions appreciated.  IMPRESSION: Mild gallbladder contraction.  Gallbladder is filled with sludge and small stones. Murphy's sign is negative.   Electronically Signed   By: Lucienne Capers M.D.   On: 12/21/2013 03:04     Assessment/Plan: MRSA R foot abscess and osteo Superficial R buttock abscess DM2 CKD  Continue vanco x 2 weeks post op, to treat his buttock wound If he will not be in hospital/SNF/rehab, could give him doxy to complete course.  Increased INR Available if questions.   Total days of antibiotics: [facility] (pager) [phone] [email or web address] 12/22/2013, 4:00 PM  LOS: 13 days

## 2013-12-22 NOTE — Progress Notes (Signed)
Inpatient Diabetes Program Recommendations  AACE/ADA: New Consensus Statement on Inpatient Glycemic Control (2013)  Target Ranges:  Prepandial:   less than 140 mg/dL      Peak postprandial:   less than 180 mg/dL (1-2 hours)      Critically ill patients:  140 - 180 mg/dL   Results for [name], [name] (MRN [id number]) as of 12/22/2013 09:31  Ref. Range 12/21/2013 17:38 12/21/2013 21:51 12/22/2013 07:45  Glucose-Capillary Latest Range: 70-99 mg/dL 273 (H) 237 (H) 296 (H)   Diabetes history: DM2 Outpatient Diabetes medications: 70/30 50 units QAM, 70/30 45 units QPM, Metformin 1000 mg BID Current orders for Inpatient glycemic control: Lantus 18 units QHS, Novolog 0-15 units TID with meals  Inpatient Diabetes Program Recommendations Insulin - Basal: Please increase Lantus to 30 units QHS. Correction (SSI): Please consider ordering Novolog bedtime correction scale. Insulin - Meal Coverage: Once diet is resumed, may need to consider ordering meal coverage.  Thanks, Barnie Alderman, RN, MSN, CCRN, CDE Diabetes Coordinator Inpatient Diabetes Program [phone] (Team Pager) [phone] ([place] office) [phone] [facility] office)

## 2013-12-22 NOTE — Progress Notes (Signed)
Patient reports that nausea is better. 

## 2013-12-22 NOTE — Progress Notes (Signed)
ANTICOAGULATION - Initial & ANTIBIOTIC - Follow-Up CONSULT NOTE  Pharmacy Consult for Heparin & Vancomcyin Indication: Hx recurrent DVTs (while warfarin on hold) & MRSA R-heel cellulitis.   No Known Allergies  Patient Measurements: Height: 6\' 2"  (188 cm) Weight: (!) 366 lb 5.8 oz (166.18 kg) IBW/kg (Calculated) : 82.2 Heparin Dosing Weight: 122 kg  Vital Signs: Temp: 98 F (36.7 C) (12/14 0458) Temp Source: Oral (12/14 0458) BP: 186/109 mmHg (12/14 0458) Pulse Rate: 117 (12/14 0458)  Labs:  Recent Labs  12/20/13 0351 12/20/13 1957 12/21/13 0410 12/22/13 0454  HGB 6.9* 8.1* 8.3* 9.4*  HCT 22.3* 25.5* 26.3* 29.8*  PLT 624* 587* 690* 804*  LABPROT 57.1*  --  58.0* 18.1*  INR 6.45*  --  6.59* 1.48  CREATININE 2.30*  --  2.35* 2.30*    Estimated Creatinine Clearance: 72 mL/min (by C-G formula based on Cr of 2.3).   Medical History: Past Medical History  Diagnosis Date  . DVT (deep venous thrombosis) 09/2002    patient reports additional DVTs in '06 & '11 (unconfirmed)  . Pulmonary embolism 09/2002    treated with 6 months of warfarin  . Hypertension   . Chest pain, neg MI, normal coronaries by cath 02/18/2013  . Hyperlipidemia 02/19/2013  . Acute venous embolism and thrombosis of deep vessels of proximal lower extremity 07/19/2011  . Type I diabetes mellitus dx'd 2001  . GERD (gastroesophageal reflux disease)   . Diabetic ulcer of right foot   . CKD (chronic kidney disease) stage 3, GFR 30-59 ml/min 02/19/2013  . Nephrotic syndrome     Assessment: 70 YOM on warfarin PTA for hx of recurrent DVTs. INR on admission was SUBtherapeutic and had a rapid rise when doses were initiated on 12/4. At this time, the cause of the rapid rise is unknown - LFTs are wnl. INR today is down to 1.48 after receiving Vit K 5 mg IV on 12/13 and pharmacy has been consulted to start a heparin bridge.   The patient is noted to be an add-on today for a transmet or BK amputation per ortho. Will  make RN aware to discuss heparin stop time with ortho prior to procedure today (if patient goes). The patient was noted to have a slow trend down in Hgb requiring 2 units PRBC over 12/7-12/8 and 12/12 - now back up to 9.4. Will not bolus and start low to monitor for drops in Hgb. Plts 804.  The patient also continues on Vancomycin for MRSA R-heel cellulitis and superficial R-buttock abscess. Ortho planning transmet or BK amputation today (add-on). A Vancomycin trough this morning was SUPRAtherapeutic (VT 27.2 mcg/ml, goal of 10-20 mcg/ml). The RN had already administered ~1/2 of the Vancomycin dose (~1g) prior to the trough resulting (machines in lab were down) - instructed RN to stop the remainder of the Vanc dose from infusing.   Goal of Therapy:  Heparin level 0.3-0.7 units/ml Monitor platelets by anticoagulation protocol: Yes  Vancomycin trough of 10-20 mcg/ml   Plan:  1. Initiate heparin at a drip rate of 1400 units/hr (14 ml/hr) 2. Daily heparin levels 3. Will continue to monitor for any signs/symptoms of bleeding and will follow up with heparin level in 6 hours  4. Will f/u plans to resume warfarin once surgeries have been completed 5. Adjust Vancomycin to 1250 mg IV every 24 hours (next dose at 1200 on 12/15) 6. Will continue to follow renal function, culture results, LOT, and antibiotic de-escalation plans   Alycia Rossetti,  PharmD, BCPS Clinical Pharmacist Pager: [phone] 12/22/2013 9:33 AM

## 2013-12-22 NOTE — Progress Notes (Signed)
CRITICAL VALUE ALERT  Critical value received: vanc level 27.2  Date of notification:  12/22/13  Time of notification: 11:10  Critical value read back:yes  Nurse who received alert:  Derald Macleod  MD notified (1st page):pharmacist Benjamine Mola  Time of first page:  no  MD notified (2nd page):none  Time of second page:na  Responding MD:na  Time MD responded:  na

## 2013-12-22 NOTE — Anesthesia Procedure Notes (Signed)
Procedure Name: Intubation Date/Time: 12/22/2013 5:44 PM Performed by: Susa Loffler Pre-anesthesia Checklist: Patient identified, Patient being monitored, Emergency Drugs available, Timeout performed and Suction available Patient Re-evaluated:Patient Re-evaluated prior to inductionOxygen Delivery Method: Circle system utilized Preoxygenation: Pre-oxygenation with 100% oxygen Intubation Type: IV induction, Cricoid Pressure applied and Rapid sequence Grade View: Grade I Tube type: Oral Tube size: 7.5 mm Number of attempts: 1 Airway Equipment and Method: Stylet and Video-laryngoscopy Placement Confirmation: ETT inserted through vocal cords under direct vision,  positive ETCO2 and breath sounds checked- equal and bilateral Secured at: 23 cm Tube secured with: Tape Dental Injury: Teeth and Oropharynx as per pre-operative assessment

## 2013-12-22 NOTE — Progress Notes (Signed)
OT Cancellation Note  Patient Details Name: [name] MRN: [id number] DOB: 03/27/1975   Cancelled Treatment:    Reason Eval/Treat Not Completed: Medical issues which prohibited therapy. Pt to have R transmetatarsal or BKA today, declined. Please reorder OT following surgery.  Malka So 12/22/2013, 11:51 AM

## 2013-12-22 NOTE — Anesthesia Preprocedure Evaluation (Addendum)
Anesthesia Evaluation  Patient identified by MRN, date of birth, ID band Patient awake    Reviewed: Allergy & Precautions, H&P , NPO status   Airway Mallampati: II  TM Distance: >3 FB Neck ROM: Full    Dental  (+) Dental Advisory Given, Teeth Intact   Pulmonary    + decreased breath sounds      Cardiovascular hypertension, + Peripheral Vascular Disease Rhythm:Regular Rate:Normal     Neuro/Psych    GI/Hepatic GERD-  ,  Endo/Other  diabetesMorbid obesity  Renal/GU Renal InsufficiencyRenal disease     Musculoskeletal   Abdominal (+) + obese,   Peds  Hematology   Anesthesia Other Findings   Reproductive/Obstetrics                            Anesthesia Physical Anesthesia Plan  ASA: III  Anesthesia Plan: General   Post-op Pain Management:    Induction: Intravenous  Airway Management Planned: Oral ETT  Additional Equipment:   Intra-op Plan:   Post-operative Plan: Extubation in OR  Informed Consent: I have reviewed the patients History and Physical, chart, labs and discussed the procedure including the risks, benefits and alternatives for the proposed anesthesia with the patient or authorized representative who has indicated his/her understanding and acceptance.   Dental advisory given  Plan Discussed with: Anesthesiologist, Surgeon and CRNA  Anesthesia Plan Comments:        Anesthesia Quick Evaluation

## 2013-12-23 ENCOUNTER — Encounter (HOSPITAL_COMMUNITY): Payer: Self-pay | Admitting: Orthopedic Surgery

## 2013-12-23 LAB — BASIC METABOLIC PANEL
Anion gap: 14 (ref 5–15)
BUN: 52 mg/dL — AB (ref 6–23)
CO2: 19 mEq/L (ref 19–32)
Calcium: 8.3 mg/dL — ABNORMAL LOW (ref 8.4–10.5)
Chloride: 106 mEq/L (ref 96–112)
Creatinine, Ser: 2.89 mg/dL — ABNORMAL HIGH (ref 0.50–1.35)
GFR calc Af Amer: 30 mL/min — ABNORMAL LOW (ref >90)
GFR calc non Af Amer: 26 mL/min — ABNORMAL LOW (ref >90)
GLUCOSE: 270 mg/dL — AB (ref 70–99)
POTASSIUM: 5.4 meq/L — AB (ref 3.7–5.3)
Sodium: 139 mEq/L (ref 137–147)

## 2013-12-23 LAB — CBC
HCT: 21.5 % — ABNORMAL LOW (ref 39.0–52.0)
Hemoglobin: 7.1 g/dL — ABNORMAL LOW (ref 13.0–17.0)
MCH: 28.2 pg (ref 26.0–34.0)
MCHC: 33 g/dL (ref 30.0–36.0)
MCV: 85.3 fL (ref 78.0–100.0)
Platelets: 504 10*3/uL — ABNORMAL HIGH (ref 150–400)
RBC: 2.52 MIL/uL — AB (ref 4.22–5.81)
RDW: 17.2 % — AB (ref 11.5–15.5)
WBC: 21 10*3/uL — AB (ref 4.0–10.5)

## 2013-12-23 LAB — GLUCOSE, CAPILLARY
GLUCOSE-CAPILLARY: 201 mg/dL — AB (ref 70–99)
Glucose-Capillary: 227 mg/dL — ABNORMAL HIGH (ref 70–99)
Glucose-Capillary: 204 mg/dL — ABNORMAL HIGH (ref 70–99)
Glucose-Capillary: 159 mg/dL — ABNORMAL HIGH (ref 70–99)

## 2013-12-23 LAB — TYPE AND SCREEN
ABO/RH(D): B POS
Antibody Screen: NEGATIVE
UNIT DIVISION: 0
Unit division: 0
Unit division: 0
Unit division: 0

## 2013-12-23 LAB — CBC WITH DIFFERENTIAL/PLATELET
BASOS PCT: 0 % (ref 0–1)
Basophils Absolute: 0 10*3/uL (ref 0.0–0.1)
Eosinophils Absolute: 0 10*3/uL (ref 0.0–0.7)
Eosinophils Relative: 0 % (ref 0–5)
HEMATOCRIT: 24.8 % — AB (ref 39.0–52.0)
HEMOGLOBIN: 8 g/dL — AB (ref 13.0–17.0)
LYMPHS PCT: 6 % — AB (ref 12–46)
Lymphs Abs: 1.3 10*3/uL (ref 0.7–4.0)
MCH: 28.1 pg (ref 26.0–34.0)
MCHC: 32.3 g/dL (ref 30.0–36.0)
MCV: 87 fL (ref 78.0–100.0)
MONO ABS: 1.3 10*3/uL — AB (ref 0.1–1.0)
Monocytes Relative: 6 % (ref 3–12)
NEUTROS ABS: 19.6 10*3/uL — AB (ref 1.7–7.7)
Neutrophils Relative %: 88 % — ABNORMAL HIGH (ref 43–77)
Platelets: 561 10*3/uL — ABNORMAL HIGH (ref 150–400)
RBC: 2.85 MIL/uL — ABNORMAL LOW (ref 4.22–5.81)
RDW: 16.8 % — ABNORMAL HIGH (ref 11.5–15.5)
WBC: 22.2 10*3/uL — ABNORMAL HIGH (ref 4.0–10.5)

## 2013-12-23 LAB — PROTIME-INR
INR: 1.42 (ref 0.00–1.49)
Prothrombin Time: 17.5 seconds — ABNORMAL HIGH (ref 11.6–15.2)

## 2013-12-23 LAB — SODIUM, URINE, RANDOM

## 2013-12-23 LAB — CREATININE, URINE, RANDOM: CREATININE, URINE: 154.74 mg/dL

## 2013-12-23 LAB — HIV ANTIBODY (ROUTINE TESTING W REFLEX): HIV 1&2 Ab, 4th Generation: NONREACTIVE

## 2013-12-23 MED ORDER — HYDROMORPHONE HCL 1 MG/ML IJ SOLN
2.0000 mg | INTRAMUSCULAR | Status: DC | PRN
  Administered 2013-12-23 – 2013-12-26 (×11): 2 mg via INTRAVENOUS
  Filled 2013-12-23 (×11): qty 2

## 2013-12-23 MED ORDER — AMLODIPINE BESYLATE 5 MG PO TABS
5.0000 mg | ORAL_TABLET | Freq: Every day | ORAL | Status: AC
Start: 2013-12-23 — End: 2013-12-25
  Administered 2013-12-23 – 2013-12-25 (×3): 5 mg via ORAL
  Filled 2013-12-23 (×3): qty 1

## 2013-12-23 MED ORDER — INSULIN GLARGINE 100 UNIT/ML ~~LOC~~ SOLN
35.0000 [IU] | Freq: Every day | SUBCUTANEOUS | Status: DC
  Administered 2013-12-23 – 2013-12-25 (×3): 35 [IU] via SUBCUTANEOUS
  Filled 2013-12-23 (×4): qty 0.35

## 2013-12-23 MED ORDER — HYDROCODONE-ACETAMINOPHEN 10-325 MG PO TABS
1.0000 | ORAL_TABLET | ORAL | Status: DC | PRN
  Administered 2013-12-23 (×2): 2 via ORAL
  Filled 2013-12-23 (×2): qty 2

## 2013-12-23 MED ORDER — HYDROMORPHONE HCL 1 MG/ML IJ SOLN: 1.0000 mg | INTRAMUSCULAR | Status: DC | PRN

## 2013-12-23 MED ORDER — WARFARIN SODIUM 2 MG PO TABS
2.0000 mg | ORAL_TABLET | Freq: Once | ORAL | Status: AC
  Administered 2013-12-23: 2 mg via ORAL
  Filled 2013-12-23: qty 1

## 2013-12-23 NOTE — Progress Notes (Signed)
Subjective: 1 Day Post-Op Procedure(s) (LRB): AMPUTATION BELOW KNEE (Right) I&D RIGHT BUTTOCK (Right) Patient reports pain as mild.  Feeling somewhat better.  Objective: Vital signs in last 24 hours: Temp:  [98 F (36.7 C)-99.3 F (37.4 C)] 99.3 F (37.4 C) (12/15 1647) Pulse Rate:  [101-114] 108 (12/15 1647) Resp:  [14-18] 18 (12/15 0400) BP: (142-179)/(76-102) 142/76 mmHg (12/15 1647) SpO2:  [92 %-100 %] 97 % (12/15 1647) Weight:  [162.342 kg (357 lb 14.4 oz)] 162.342 kg (357 lb 14.4 oz) (12/15 0400)  Intake/Output from previous day: 12/14 0701 - 12/15 0700 In: 3406.7 [P.O.:220; I.V.:2571.7; Blood:615] Out: 450 [Blood:450] Intake/Output this shift:     Recent Labs  12/21/13 0410 12/22/13 0454 12/23/13 0424 12/23/13 1600  HGB 8.3* 9.4* 8.0* 7.1*    Recent Labs  12/23/13 0424 12/23/13 1600  WBC 22.2* 21.0*  RBC 2.85* 2.52*  HCT 24.8* 21.5*  PLT 561* 504*    Recent Labs  12/22/13 0454 12/23/13 0424  NA 139 139  K 4.4 5.4*  CL 104 106  CO2 18* 19  BUN 50* 52*  CREATININE 2.30* 2.89*  GLUCOSE 309* 270*  CALCIUM 9.1 8.3*    Recent Labs  12/22/13 0454 12/23/13 0424  INR 1.48 1.42      Assessment/Plan: 1 Day Post-Op Procedure(s) (LRB): AMPUTATION BELOW KNEE (Right) I&D RIGHT BUTTOCK (Right) Up with therapy Discharge to SNF  Stump protector tomorrow.  OK to start coumadin. PT for ambulation.  [name] 12/23/2013, 8:21 PM

## 2013-12-23 NOTE — Progress Notes (Signed)
ANTICOAGULATION CONSULT NOTE - Follow Up Consult  Pharmacy Consult for Warfarin Indication: Hx recurrent DVTs  No Known Allergies  Patient Measurements: Height: 6\' 2"  (188 cm) Weight: (!) 357 lb 14.4 oz (162.342 kg) IBW/kg (Calculated) : 82.2  Vital Signs: Temp: 98 F (36.7 C) (12/15 0950) Temp Source: Axillary (12/15 0950) BP: 168/85 mmHg (12/15 0950) Pulse Rate: 111 (12/15 0950)  Labs:  Recent Labs  12/21/13 0410 12/22/13 0454 12/23/13 0424  HGB 8.3* 9.4* 8.0*  HCT 26.3* 29.8* 24.8*  PLT 690* 804* 561*  LABPROT 58.0* 18.1* 17.5*  INR 6.59* 1.48 1.42  CREATININE 2.35* 2.30* 2.89*    Estimated Creatinine Clearance: 56.5 mL/min (by C-G formula based on Cr of 2.89).   Assessment: 37 YOM on warfarin PTA for hx of recurrent DVTs. INR on admission was SUBtherapeutic and had a rapid rise when doses were initiated on 12/4. At this time, the cause of the rapid rise is unknown - LFTs are wnl. INR was reversed with Vit K 5 mg IV on 12/13 for R-BKA done on 12/14. Pharmacy was consulted to resume warfarin post-op however RN held the dose per discussion with the primary team. Discussed with the primary team today and clarified that warfarin will be resumed this evening.    INR today is SUBtherapeutic (1.42, goal of 2-3). The patient was noted to have a slow trend down in Hgb requiring 2 units PRBC over 12/7-12/8 and 12/12. Hgb 8 << 9.4 likely d/t procedure yesterday, plts 561. Given the patient's trend in INR rise this admission - will start with a low warfarin dose.  Goal of Therapy:  INR 2-3   Plan:  1. Warfarin 2 mg x 1 dose at 1800 today 2. Will continue to monitor for any signs/symptoms of bleeding and will follow up with PT/INR in the a.m.   Alycia Rossetti, PharmD, BCPS Clinical Pharmacist Pager: [phone] 12/23/2013 11:08 AM

## 2013-12-23 NOTE — Progress Notes (Signed)
Inpatient Diabetes Program Recommendations  AACE/ADA: New Consensus Statement on Inpatient Glycemic Control (2013)  Target Ranges:  Prepandial:   less than 140 mg/dL      Peak postprandial:   less than 180 mg/dL (1-2 hours)      Critically ill patients:  140 - 180 mg/dL   Results for GEROLD, WEIGMAN (MRN [id number]) as of 12/23/2013 09:48  Ref. Range 12/22/2013 07:45 12/22/2013 11:33 12/22/2013 16:15 12/22/2013 16:51 12/22/2013 19:53 12/22/2013 21:43 12/23/2013 07:30  Glucose-Capillary Latest Range: 70-99 mg/dL 296 (H) 241 (H) 221 (H) 222 (H) 206 (H) 232 (H) 227 (H)   Diabetes history: DM2 (per H&P) Outpatient Diabetes medications: 70/30 50 units QAM, 70/30 45 units QPM, Metformin 1000 mg BID Current orders for Inpatient glycemic control: Lantus 30 units QHS, Novolog 0-15 units TID with meals   Inpatient Diabetes Program Recommendations Insulin - Basal: Fasting 227 mg/dl this morning after receiving Lantus 30 units last night at bedtime. Please consider increasing Lantus to 35 units QHS. Correction (SSI): Please order Novolog bedtime correction scale. Insulin - Meal Coverage: If patient is eating at least 50% of meals, please consider ordering Novolog 3 units TID with meals for meal coverage (in addition to Novolog correction scale).  Thanks, Barnie Alderman, RN, MSN, CCRN, CDE Diabetes Coordinator Inpatient Diabetes Program [phone] (Team Pager) [phone] ([place] office) [phone] [facility] office)

## 2013-12-23 NOTE — Progress Notes (Signed)
   12/23/13 1400  Clinical Encounter Type  Visited With Patient;Health care provider  Visit Type Initial;Spiritual support;Social support  Spiritual Encounters  Spiritual Needs Emotional;Grief support  Stress Factors  Patient Stress Factors Health changes;Loss;Major life changes   Chaplain was referred to patient via spiritual care consult. One of the major themes of our visit was grief. Patient had to have surgery yesterday and had to have one of his feet amputated. The patient experienced this as happening really rapidly. Patient is grieving the loss of his foot. Patient also expressed to chaplain that he was having a few interpersonal issues before he came to the hospital. Patient describes himself as a strong leader in his family and is a role model and strong supporter of his nephews. While patient is a strong member of his family and his family has promised to support him in this time, patient does not feel adequately supported by family at this time. Patient described himself as angry, sad, and lacking direction. Chaplain affirmed these emotions and encouraged patient to continue expressing them.  Patient fears he will not be able to move forward and be adequate enough to continue in his life's dreams prior to this major amputation. Chaplain plans to follow up with patient at a later time to provide more spiritual and emotional support during this period of change for the patient. [name], [name], Chaplain  2:52 PM

## 2013-12-23 NOTE — Evaluation (Signed)
Physical Therapy Re-Evaluation Patient Details Name: [name] MRN: [id number] DOB: [birth year] Today's Date: 12/23/2013   History of Present Illness  Patient is a 38 y/o male admitted with non healing ulcer on right foot.  Also with buttock abcess.  Underwent I&D of right foot, then BKA 12/22/13 along with I&D of buttock abcess.  Clinical Impression  Patient presents s/p right BKA with decreased independence with mobility due to deficits listed in PT problem list.  He will benefit from skilled PT in the acute setting to allow return home with family assist following CIR rehab stay.  Unable to achieve standing today due to weakness, fear and emotions.  Feel increased level of assist and equipment for safety will allow this young patient to progress back to home.  He is motivated for prosthesis and a good candidate for CIR level rehab.    Follow Up Recommendations CIR    Equipment Recommendations  Hospital bed;Wheelchair (measurements PT);Wheelchair cushion (measurements PT);Rolling walker with 5" wheels;3in1 (PT) (depends on progress and d/c destination)    Recommendations for Other Services Rehab consult     Precautions / Restrictions Precautions Precautions: Fall Restrictions Weight Bearing Restrictions: Yes RLE Weight Bearing: Non weight bearing Other Position/Activity Restrictions: new right BKA      Mobility  Bed Mobility Overal bed mobility: Needs Assistance;+ 2 for safety/equipment;+2 for physical assistance Bed Mobility: Supine to Sit;Sit to Supine     Supine to sit: Min assist;HOB elevated Sit to supine: Supervision   General bed mobility comments: increased time and effort up to EOB, min assist for trunk upright; to supine supervision  Transfers Overall transfer level: Needs assistance Equipment used: Rolling walker (2 wheeled) Transfers: Sit to/from Stand Sit to Stand: From elevated surface;+2 physical assistance;Max assist         General transfer  comment: attempted x 3 from elevated surface with walker in front, kept coming forward and not lifting hips; concern for sliding in floor so did not attempt again  Ambulation/Gait                Stairs            Wheelchair Mobility    Modified Rankin (Stroke Patients Only)       Balance     Sitting balance-[name]: Good Sitting balance - Comments: can accept challenges and weight shift, but obviously missing foot support on right                                     Pertinent Vitals/Pain Faces Pain Scale: Hurts whole lot Pain Location: right leg (rates at "26") Pain Intervention(s): Monitored during session;Limited activity within patient's tolerance;Repositioned    Home Living Family/patient expects to be discharged to:: Private residence Living Arrangements: Spouse/significant other;Parent Available Help at Discharge: Family Type of Home: House (townhouse at mothers) Home Access: Stairs to enter Entrance Stairs-Rails: Right Entrance Stairs-Number of Steps: 4 steps to enter at girlfriend's  Home Layout: One level Home Equipment: None      Prior Function Level of Independence: Independent               Hand Dominance   Dominant Hand: Right    Extremity/Trunk Assessment               Lower Extremity Assessment: RLE deficits/detail RLE Deficits / Details: able to lift leg antigravity and extend leg in sitting, bends knee well,  kept [place] on when in bed       Communication   Communication: No difficulties  Cognition Arousal/Alertness: Awake/alert Behavior During Therapy: WFL for tasks assessed/performed (admits to depressed/crying this morning) Overall Cognitive Status: Within Functional Limits for tasks assessed                      General Comments      Exercises Total Joint Exercises Quad Sets: AROM;Both;5 reps;Supine Straight Leg Raises: AROM;Right;5 reps;Supine      Assessment/Plan    PT  Assessment Patient needs continued PT services  PT Diagnosis Difficulty walking;Acute pain;Generalized weakness   PT Problem List Decreased strength;Decreased mobility;Decreased safety awareness;Decreased balance;Decreased knowledge of use of DME;Decreased activity tolerance;Decreased knowledge of precautions;Pain;Obesity  PT Treatment Interventions DME instruction;Gait training;Therapeutic exercise;Balance training;Wheelchair mobility training;Functional mobility training;Therapeutic activities;Patient/family education   PT Goals (Current goals can be found in the Care Plan section) Acute Rehab PT Goals Patient Stated Goal: return to independent PT Goal Formulation: With patient Time For Goal Achievement: 01/06/14 Potential to Achieve Goals: Good    Frequency Min 4X/week   Barriers to discharge Inaccessible home environment      Co-evaluation               End of Session Equipment Utilized During Treatment: Gait belt Activity Tolerance: Patient limited by fatigue Patient left: in bed;with call bell/phone within reach Nurse Communication: Mobility status         Time: [id number] PT Time Calculation (min) (ACUTE ONLY): 30 min   Charges:   PT Evaluation $PT Re-evaluation: 1 Procedure PT Treatments $Therapeutic Activity: 23-37 mins   PT G Codes:          Sanad Fearnow,CYNDI 29-Dec-2013, 4:49 PM Magda Kiel, [place] 12/29/13

## 2013-12-23 NOTE — Op Note (Signed)
NAME:  [name], [name]           ACCOUNT NO.:  [id number]  MEDICAL RECORD NO.:  [id number]  LOCATION:  [id number]                        FACILITY:  [facility]  PHYSICIAN:  [name], MD        DATE OF BIRTH:  10-28-1975  DATE OF PROCEDURE:  12/22/2013 DATE OF DISCHARGE:                              OPERATIVE REPORT   PREOPERATIVE DIAGNOSES: 1. Right buttock abscess. 2. Right foot abscesses and wet gangrene.  POSTOPERATIVE DIAGNOSES: 1. Right buttock abscess. 2. Right foot abscesses and wet gangrene.  PROCEDURE: 1. Irrigation and excisional debridement of right buttock abscess. 2. Right below-knee amputation.  SURGEON:  [name], MD  ANESTHESIA:  General.  ESTIMATED BLOOD LOSS:  450 mL.  TOURNIQUET TIME:  25 minutes at 400 mmHg.  COMPLICATIONS:  None apparent.  DISPOSITION:  Extubated, awake, and stable to recovery.  INDICATIONS FOR PROCEDURE:  The patient is a 38 year old male with past medical history significant for morbid obesity and uncontrolled diabetes.  He has developed an abscess of the right buttock in addition to multiple right forefoot abscesses.  He is status post multiple irrigation and debridement surgeries for his right forefoot abscesses and has had progression of his right forefoot wet gangrene despite intravenous antibiotics and multiple debridement surgeries.  He has persistent nausea and vomiting as well as white blood cell count of mid 20,000.  He presents for irrigation and debridement of the right buttock abscess as well as right transmetatarsal amputation versus below-knee amputation.  He understands the risks and benefits, the alternative treatment options, and elects surgical treatment.  He specifically understands risks of bleeding, infection, nerve damage, blood clots, need for additional surgery, continued pain, amputation, and death.  PROCEDURE IN DETAIL:  After preoperative consent was obtained and the correct operative sites were  identified, the patient was brought to the operating room and placed supine on the operating table.  General anesthesia was induced.  The patient was already on therapeutic antibiotics.  Surgical time-out was taken.  The patient's right buttock abscess was then prepped and draped in standard sterile fashion.  A longitudinal incision was made over the abscess and sharp dissection was carried down through the level of the skin to the superficial subcutaneous tissue.  Immediately evident was purulent fluid draining from the wound.  This was cultured with aerobic and anaerobic swabs. Blunt dissection was then carried down into the area of the abscess and the abscess was broken up and then excisional debridement was carried out with a curette and scissors.  The wound was then irrigated copiously with 3 L of normal saline.  Half-inch iodoform packing was then placed in the depths of the abscess cavity.  Sterile dressings were applied.  Attention was then turned to the right lower extremity.  Foot was carefully examined.  There were gangrenous changes of the forefoot extending to the level of the midfoot and tracking posterior to the ankle.  The soft tissue envelope was compromised to the level of the midfoot leaving insufficient soft tissue coverage to allow transmetatarsal amputation.  The decision was made to proceed with below- knee amputation as the next viable level of healthy soft tissue.  The right lower extremity was then  prepped and draped in standard sterile fashion with the tourniquet around the thigh.  The extremity was exsanguinated and the tourniquet inflated to 350 mmHg.  A posterior flap incision was marked on the skin.  The incision was made.  Sharp dissection was carried down through the skin and subcutaneous tissue to the level of the tibia anteriorly.  Significant venous bleeding was identified.  The tourniquet was released to eliminate the venous tourniquet.  The anterior  tibia was then subperiosteally dissected for several centimeters proximally.  The tibia was cut with reciprocating saw.  The anterior neurovascular bundle was identified and clamped.  The fibula was exposed.  The fibula was then cut while protecting the surrounding soft tissues.  At this point, brisk arterial bleeding was encountered.  The tourniquet was reinflated.  Hemostasis was achieved and the amputation knife was used to cut through the posterior soft tissues contouring the flap appropriately.  The leg was passed off the field as a specimen to Pathology.  The tibial cut was beveled with a rasp.  The wound was irrigated copiously.  The tibial nerve was cut and allowed to retract into the appropriate soft tissues.  The sural nerve was similarly cut and allowed to retract.  The named vascular structures were all tied with silk ties.  The tourniquet was released and hemostasis was confirmed.  The posterior flap was then further contoured with sharp dissection.  The gastrocnemius fascia was repaired to the anterior tibial periosteum with imbricating sutures of 0 PDS.  The deep subcutaneous tissues were approximated with inverted simple sutures of 0 PDS.  The skin incision was then closed with staples.  Sterile dressings were applied followed by a compression wrap and a knee immobilizer. Prior to applying the dressings, 30 mL of 0.5% Marcaine with epinephrine was infiltrated into the skin and subcutaneous tissues around the incision site.  The patient was then awakened from anesthesia and transported to the recovery room in stable condition.  FOLLOWUP PLAN:  The patient will be readmitted to the Internal Medicine Teaching Service.  He will be nonweightbearing on the right lower extremity.  He will begin dressing changes for the right buttock abscess and continued on IV vancomycin per the Infectious Disease consultant.     [name], MD     JH/MEDQ  D:  12/22/2013  T:   12/23/2013  Job:  [id number]

## 2013-12-23 NOTE — Progress Notes (Signed)
Pt has not voided since after surgery. Refuses bladder scan this AM. Said to give him more time. Will continue to follow.

## 2013-12-23 NOTE — Progress Notes (Signed)
Subjective: Underwent R BKA and R I&D of R buttock abscess yesterday. Pain not controlled by dilaudid and percocet. Some nausea but no emesis. Abdominal pain improved. Denies CP or shortness of breath.  Objective: Vital signs in last 24 hours: Filed Vitals:   12/23/13 0118 12/23/13 0204 12/23/13 0400 12/23/13 0950  BP: 175/102 172/90 153/95 168/85  Pulse: 111 108 114 111  Temp: 98.1 F (36.7 C)  98 F (36.7 C) 98 F (36.7 C)  TempSrc: Oral  Axillary Axillary  Resp: 16  18   Height:      Weight:   357 lb 14.4 oz (162.342 kg)   SpO2: 99%  97% 97%   Weight change: -8 lb 7.4 oz (-3.838 kg)  Intake/Output Summary (Last 24 hours) at 12/23/13 1101 Last data filed at 12/23/13 0558  Gross per 24 hour  Intake 3406.67 ml  Output    450 ml  Net 2956.67 ml   General: NAD HEENT: [place]/AT Cardiac: RRR, no rubs, murmurs or gallops Pulm: clear to auscultation bilaterally, moving normal volumes of air Abd: soft, + BS, nondistended, TTP Ext: +1 B/L LE edema, dressing in place over R BKA Neuro: alert and oriented X3, responding appropriately, follows commands  Lab Results: Basic Metabolic Panel:  Recent Labs Lab 12/22/13 0454 12/23/13 0424  NA 139 139  K 4.4 5.4*  CL 104 106  CO2 18* 19  GLUCOSE 309* 270*  BUN 50* 52*  CREATININE 2.30* 2.89*  CALCIUM 9.1 8.3*   Liver Function Tests:  Recent Labs Lab 12/21/13 0410 12/22/13 0454  AST 18 16  ALT 22 21  ALKPHOS 316* 246*  BILITOT 0.6 0.6  PROT 8.2 9.1*  ALBUMIN 2.0* 2.2*   CBC:  Recent Labs Lab 12/22/13 0454 12/23/13 0424  WBC 22.1* 22.2*  NEUTROABS 18.6* 19.6*  HGB 9.4* 8.0*  HCT 29.8* 24.8*  MCV 86.6 87.0  PLT 804* 561*   CBG:  Recent Labs Lab 12/22/13 1133 12/22/13 1615 12/22/13 1651 12/22/13 1953 12/22/13 2143 12/23/13 0730  GLUCAP 241* 221* 222* 206* 232* 227*   Coagulation:  Recent Labs Lab 12/20/13 0351 12/21/13 0410 12/22/13 0454 12/23/13 0424  LABPROT 57.1* 58.0* 18.1* 17.5*  INR  6.45* 6.59* 1.48 1.42   Urine Drug Screen: Drugs of Abuse     Component Value Date/Time   LABOPIA POSITIVE* 07/12/2011 0038   COCAINSCRNUR NONE DETECTED 07/12/2011 0038   LABBENZ NONE DETECTED 07/12/2011 0038   AMPHETMU NONE DETECTED 07/12/2011 0038   THCU NONE DETECTED 07/12/2011 0038   LABBARB NONE DETECTED 07/12/2011 0038    Micro Results: Recent Results (from the past 240 hour(s))  Culture, blood (routine x 2)     Status: None   Collection Time: 12/13/13  8:47 PM  Result Value Ref Range Status   Specimen Description BLOOD RIGHT ARM  Final   Special Requests BOTTLES DRAWN AEROBIC ONLY 10CC  Final   Culture  Setup Time   Final    12/14/2013 03:54 Performed at [facility]    Culture   Final    NO GROWTH 5 DAYS Performed at [facility]    Report Status 12/20/2013 FINAL  Final  Culture, blood (routine x 2)     Status: None   Collection Time: 12/13/13  9:03 PM  Result Value Ref Range Status   Specimen Description BLOOD RIGHT ARM  Final   Special Requests BOTTLES DRAWN AEROBIC ONLY [place]  Final   Culture  Setup Time   Final  12/14/2013 03:54 Performed at [facility]   Final    NO GROWTH 5 DAYS Performed at [facility]    Report Status 12/20/2013 FINAL  Final  Culture, Urine     Status: None   Collection Time: 12/14/13 12:15 AM  Result Value Ref Range Status   Specimen Description URINE, CLEAN CATCH  Final   Special Requests NONE  Final   Culture  Setup Time   Final    12/14/2013 01:11 Performed at [place] Performed at [facility]   Final   Culture NO GROWTH Performed at [facility]   Final   Report Status 12/15/2013 FINAL  Final  Surgical pcr screen     Status: Abnormal   Collection Time: 12/15/13 12:00 AM  Result Value Ref Range Status   MRSA, PCR NEGATIVE NEGATIVE Final   Staphylococcus aureus POSITIVE (A) NEGATIVE Final    Comment:        The Xpert  SA Assay (FDA approved for NASAL specimens in patients over 108 years of age), is one component of a comprehensive surveillance program.  Test performance has been validated by [employer] for patients greater than or equal to 16 year old. It is not intended to diagnose infection nor to guide or monitor treatment. RESULT CALLED TO, READ BACK BY AND VERIFIED WITH: C OGIN AJALA,RN 12/15/13 0226 BY RHOLMES   Culture, routine-abscess     Status: None   Collection Time: 12/15/13  1:09 AM  Result Value Ref Range Status   Specimen Description ABSCESS RIGHT FOOT  Final   Special Requests MEDIAL PATIENT ON FOLLOWING ZOSYN VANCOMYCIN  Final   Gram Stain   Final    RARE WBC PRESENT,BOTH PMN AND MONONUCLEAR NO SQUAMOUS EPITHELIAL CELLS SEEN NO ORGANISMS SEEN Performed at [facility]    Culture   Final    NO GROWTH 3 DAYS Performed at [facility]    Report Status 12/18/2013 FINAL  Final  Anaerobic culture     Status: None   Collection Time: 12/15/13  1:09 AM  Result Value Ref Range Status   Specimen Description ABSCESS RIGHT FOOT  Final   Special Requests MEDIAL PATIENT ON FOLLOWING ZOSYN VANCOMYCIN  Final   Gram Stain   Final    RARE WBC PRESENT,BOTH PMN AND MONONUCLEAR NO SQUAMOUS EPITHELIAL CELLS SEEN NO ORGANISMS SEEN Performed at [facility]    Culture   Final    NO ANAEROBES ISOLATED Performed at [facility]    Report Status 12/20/2013 FINAL  Final  Clostridium Difficile by PCR     Status: None   Collection Time: 12/16/13  5:34 PM  Result Value Ref Range Status   C difficile by pcr NEGATIVE NEGATIVE Final  Anaerobic culture     Status: None (Preliminary result)   Collection Time: 12/22/13  6:36 PM  Result Value Ref Range Status   Specimen Description ABSCESS BUTTOCKS  Final   Special Requests NONE  Final   Gram Stain PENDING  Incomplete   Culture   Final    NO ANAEROBES ISOLATED; CULTURE IN PROGRESS FOR 5 DAYS Performed at [facility]    Report Status PENDING  Incomplete  Culture, routine-abscess     Status: None (Preliminary result)   Collection Time: 12/22/13  6:36 PM  Result Value Ref Range Status   Specimen Description ABSCESS BUTTOCKS  Final  Special Requests NONE  Final   Gram Stain PENDING  Incomplete   Culture NO GROWTH Performed at [facility]   Final   Report Status PENDING  Incomplete   Studies/Results: No results found. Medications: I have reviewed the patient's current medications. Scheduled Meds: . antiseptic oral rinse  7 mL Mouth Rinse q12n4p  . chlorhexidine  15 mL Mouth Rinse BID  . docusate sodium  100 mg Oral BID  . feeding supplement (GLUCERNA SHAKE)  237 mL Oral BID BM  . insulin aspart  0-15 Units Subcutaneous TID WC  . insulin glargine  30 Units Subcutaneous QHS  . pravastatin  20 mg Oral q1800  . senna  1 tablet Oral BID  . vancomycin  1,250 mg Intravenous Q24H  . Warfarin - Pharmacist Dosing Inpatient   Does not apply q1800   Continuous Infusions: . sodium chloride 100 mL/hr at 12/22/13 2218   PRN Meds:.acetaminophen **OR** acetaminophen, dextrose, HYDROmorphone (DILAUDID) injection, labetalol, menthol-cetylpyridinium, metoCLOPramide **OR** metoCLOPramide (REGLAN) injection, ondansetron (ZOFRAN) IV, oxyCODONE-acetaminophen, promethazine   Assessment/Plan:  Diabetic foot ulcer and abscess, right foot with sepsis:  S/p Bedside I&D 12/11/2013.  Wound cx growing few methicillin resistant staph aureus.  S/p repeat I&D 12/14/2013. No organisms seen on repeat wound culture. D/c'd zosyn on day 9. Remains on IV vanc. Persistent leukocytosis, though decreasing slowly in recent days. Remains afebrile.  Appreciate orthopedics and ID recommendations/interventions.  S/p R BKA 12/22/2013.   - Norco 10/320m 1-2 tab q4hr prn pain - Docusate 1020mBID, senna BID - IV dilaudid 60m45m3h prn - PT/OT  R posterior hip cellulitis with focal 7mm38muid collection in superficial  subcutaneous fat.  S/p I&D 12/22/2013 - continue IV Vancomycin. Will continue antibiotics for 2 weeks post op. End date 01/05/2014. - continue to monitor  Diarrhea, abdominal pain, nausea/vomiting: C diff PCR negative. Likely 2/2 antibiotic associated diarrhea. Diarrhea has resolved. CT abd pelvis with no acute findings. LFTs wnl except elevated Alk phos to 2 times the normal limit. GGT elevated indicating likely hepatic origin. RUQ US -[place] GB contraction, GB filled with sludge and small stones, murphy's sign negative, borderline GB wall thickness of 3.5mm,13m focal liver lesions. Improving.  - continue to monitor  - Zofran 4mg I48m6hr prn, Reglan 5-10mg Q68mprn for breakthrough  - pain control as above  Acute on chronic anemia: Anemia of chronic disease. Has undergone two transfusions this hospitalization.  - Check FOBT  - Continue to monitor CBC. Recheck Hgb this afternoon. Transfuse pRBC prn.  Metabolic acidosis with increased anion gap: Likely 2/2 starvation as he has had poor PO intake. Lactic acid wnl. No Ketones on UA. Resolved.  - Continue to monitor  Suspected Diabetic ketoacidosis secondary to type 2 diabetes: Resolved.  Novolog 70/30 d/c due to patient with poor po and a few lows in the 60s.  CBG in the 200s yesterday and overnight.  - SSI mod, CBG q AC/HS  - Increase Lantus 35u qHS; careful increase due to poor po and prior low CBGs.  AKI on CKD stage 2: Cr increased to 2.89 from 2.3. (baseline around 1.6).   - Continue to hold nephrotoxins  - NS @ 100cc/hr  - Obtain urine urea, urine creatinine, urine sodium  - strict I&Os   Hypertension:   - Holding home lisinopril-hydrochlorothiazide in the setting of AKI  - Hold home lasix 40mg da33mas above  - IV labetalol prn for SBP >180  - start amlodipine 5mg dail8m  FEN:   - Carb modified  - NS @ 100cc/hr   Hx Recurrent DVTs, DVT ppx: SCDs, warfarin  Dispo: Disposition is deferred at this time, awaiting improvement of  current medical problems.  Anticipated discharge in approximately 1-2 day(s).   The patient does have a current PCP Lucious Groves, DO) and does need an [facility] hospital follow-up appointment after discharge.  The patient does not have transportation limitations that hinder transportation to clinic appointments.  .Services Needed at time of discharge: Y = Yes, Blank = No PT: Rolling walker with 5" wheels, 3 in 1  OT: 24hr assistance  RN:   Equipment:   Other:     LOS: 14 days   Jacques Earthly, MD 12/23/2013, 11:01 AM

## 2013-12-24 DIAGNOSIS — L02419 Cutaneous abscess of limb, unspecified: Secondary | ICD-10-CM

## 2013-12-24 DIAGNOSIS — L03119 Cellulitis of unspecified part of limb: Secondary | ICD-10-CM

## 2013-12-24 LAB — BASIC METABOLIC PANEL
Anion gap: 12 (ref 5–15)
BUN: 47 mg/dL — ABNORMAL HIGH (ref 6–23)
CO2: 20 mEq/L (ref 19–32)
CREATININE: 2.78 mg/dL — AB (ref 0.50–1.35)
Calcium: 8.3 mg/dL — ABNORMAL LOW (ref 8.4–10.5)
Chloride: 105 mEq/L (ref 96–112)
GFR calc non Af Amer: 27 mL/min — ABNORMAL LOW (ref >90)
GFR, EST AFRICAN AMERICAN: 32 mL/min — AB (ref >90)
GLUCOSE: 150 mg/dL — AB (ref 70–99)
Potassium: 4.5 mEq/L (ref 3.7–5.3)
Sodium: 137 mEq/L (ref 137–147)

## 2013-12-24 LAB — UREA NITROGEN, URINE: UREA NITROGEN UR: 541 mg/dL

## 2013-12-24 LAB — CBC WITH DIFFERENTIAL/PLATELET
BASOS ABS: 0 10*3/uL (ref 0.0–0.1)
BASOS PCT: 0 % (ref 0–1)
EOS ABS: 0.3 10*3/uL (ref 0.0–0.7)
Eosinophils Relative: 2 % (ref 0–5)
HEMATOCRIT: 22.4 % — AB (ref 39.0–52.0)
HEMOGLOBIN: 7.1 g/dL — AB (ref 13.0–17.0)
LYMPHS ABS: 2.1 10*3/uL (ref 0.7–4.0)
Lymphocytes Relative: 13 % (ref 12–46)
MCH: 27.1 pg (ref 26.0–34.0)
MCHC: 31.7 g/dL (ref 30.0–36.0)
MCV: 85.5 fL (ref 78.0–100.0)
Monocytes Absolute: 1.3 10*3/uL — ABNORMAL HIGH (ref 0.1–1.0)
Monocytes Relative: 8 % (ref 3–12)
Neutro Abs: 12.3 10*3/uL — ABNORMAL HIGH (ref 1.7–7.7)
Neutrophils Relative %: 77 % (ref 43–77)
Platelets: 479 10*3/uL — ABNORMAL HIGH (ref 150–400)
RBC: 2.62 MIL/uL — ABNORMAL LOW (ref 4.22–5.81)
RDW: 16.8 % — AB (ref 11.5–15.5)
WBC: 16 10*3/uL — ABNORMAL HIGH (ref 4.0–10.5)

## 2013-12-24 LAB — PROTIME-INR
INR: 1.48 (ref 0.00–1.49)
Prothrombin Time: 18.1 seconds — ABNORMAL HIGH (ref 11.6–15.2)

## 2013-12-24 LAB — GLUCOSE, CAPILLARY
Glucose-Capillary: 142 mg/dL — ABNORMAL HIGH (ref 70–99)
Glucose-Capillary: 153 mg/dL — ABNORMAL HIGH (ref 70–99)
Glucose-Capillary: 159 mg/dL — ABNORMAL HIGH (ref 70–99)
Glucose-Capillary: 147 mg/dL — ABNORMAL HIGH (ref 70–99)

## 2013-12-24 MED ORDER — HYDROCODONE-ACETAMINOPHEN 10-325 MG PO TABS
1.0000 | ORAL_TABLET | ORAL | Status: DC
  Administered 2013-12-24 – 2013-12-26 (×13): 2 via ORAL
  Filled 2013-12-24 (×13): qty 2

## 2013-12-24 MED ORDER — WARFARIN SODIUM 2 MG PO TABS
2.0000 mg | ORAL_TABLET | Freq: Once | ORAL | Status: AC
  Administered 2013-12-24: 2 mg via ORAL
  Filled 2013-12-24: qty 1

## 2013-12-24 NOTE — Progress Notes (Signed)
Rehab admissions - Evaluated for possible admission.  I met with patient and his girlfriend at the bedside.  They are interested in inpatient rehab admission.  Patient has no insurance.  He is requesting to speak with financial counselor and apply for medicaid/disability.  I will call financial counselor.  I will check back tomorrow for bed availability.  Currently rehab beds are full.  Call me for questions.  #[phone]

## 2013-12-24 NOTE — Progress Notes (Signed)
Subjective: Urinary retention overnight requiring in and out cath. Some improved pain control by dilaudid and Norco. N/V, abdominal pain improved. Improved toleration of diet. Denies CP or shortness of breath.  Objective: Vital signs in last 24 hours: Filed Vitals:   12/23/13 1255 12/23/13 1647 12/23/13 2032 12/24/13 0429  BP: 179/93 142/76 141/100 167/99  Pulse: 114 108 108 105  Temp: 99.1 F (37.3 C) 99.3 F (37.4 C) 98.9 F (37.2 C) 98.7 F (37.1 C)  TempSrc: Oral Oral Oral Oral  Resp:   18 18  Height:      Weight:    352 lb 12.8 oz (160.029 kg)  SpO2: 95% 97% 93% 94%   Weight change: -5 lb 1.6 oz (-2.313 kg)  Intake/Output Summary (Last 24 hours) at 12/24/13 0930 Last data filed at 12/24/13 0600  Gross per 24 hour  Intake 3208.67 ml  Output   1700 ml  Net 1508.67 ml   General: NAD HEENT: [place]/AT Cardiac: RRR, no rubs, murmurs or gallops Pulm: clear to auscultation bilaterally, moving normal volumes of air Abd: soft, + BS, nondistended, mild ttp Ext: +1 B/L LE edema, dressing in place over R BKA Neuro: alert and oriented X3, responding appropriately, follows commands  Lab Results: Basic Metabolic Panel:  Recent Labs Lab 12/23/13 0424 12/24/13 0447  NA 139 137  K 5.4* 4.5  CL 106 105  CO2 19 20  GLUCOSE 270* 150*  BUN 52* 47*  CREATININE 2.89* 2.78*  CALCIUM 8.3* 8.3*   Liver Function Tests:  Recent Labs Lab 12/21/13 0410 12/22/13 0454  AST 18 16  ALT 22 21  ALKPHOS 316* 246*  BILITOT 0.6 0.6  PROT 8.2 9.1*  ALBUMIN 2.0* 2.2*   CBC:  Recent Labs Lab 12/23/13 0424 12/23/13 1600 12/24/13 0447  WBC 22.2* 21.0* 16.0*  NEUTROABS 19.6*  --  12.3*  HGB 8.0* 7.1* 7.1*  HCT 24.8* 21.5* 22.4*  MCV 87.0 85.3 85.5  PLT 561* 504* 479*   CBG:  Recent Labs Lab 12/22/13 2143 12/23/13 0730 12/23/13 1215 12/23/13 1649 12/23/13 2051 12/24/13 0715  GLUCAP 232* 227* 201* 204* 159* 142*   Coagulation:  Recent Labs Lab 12/21/13 0410  12/22/13 0454 12/23/13 0424 12/24/13 0447  LABPROT 58.0* 18.1* 17.5* 18.1*  INR 6.59* 1.48 1.42 1.48   Urine Drug Screen: Drugs of Abuse     Component Value Date/Time   LABOPIA POSITIVE* 07/12/2011 0038   COCAINSCRNUR NONE DETECTED 07/12/2011 0038   LABBENZ NONE DETECTED 07/12/2011 0038   AMPHETMU NONE DETECTED 07/12/2011 0038   THCU NONE DETECTED 07/12/2011 0038   LABBARB NONE DETECTED 07/12/2011 0038    Micro Results: Recent Results (from the past 240 hour(s))  Surgical pcr screen     Status: Abnormal   Collection Time: 12/15/13 12:00 AM  Result Value Ref Range Status   MRSA, PCR NEGATIVE NEGATIVE Final   Staphylococcus aureus POSITIVE (A) NEGATIVE Final    Comment:        The Xpert SA Assay (FDA approved for NASAL specimens in patients over 54 years of age), is one component of a comprehensive surveillance program.  Test performance has been validated by [employer] for patients greater than or equal to 7 year old. It is not intended to diagnose infection nor to guide or monitor treatment. RESULT CALLED TO, READ BACK BY AND VERIFIED WITH: C OGIN AJALA,RN 12/15/13 0226 BY RHOLMES   Culture, routine-abscess     Status: None   Collection Time: 12/15/13  1:09  AM  Result Value Ref Range Status   Specimen Description ABSCESS RIGHT FOOT  Final   Special Requests MEDIAL PATIENT ON FOLLOWING ZOSYN VANCOMYCIN  Final   Gram Stain   Final    RARE WBC PRESENT,BOTH PMN AND MONONUCLEAR NO SQUAMOUS EPITHELIAL CELLS SEEN NO ORGANISMS SEEN Performed at [facility]    Culture   Final    NO GROWTH 3 DAYS Performed at [facility]    Report Status 12/18/2013 FINAL  Final  Anaerobic culture     Status: None   Collection Time: 12/15/13  1:09 AM  Result Value Ref Range Status   Specimen Description ABSCESS RIGHT FOOT  Final   Special Requests MEDIAL PATIENT ON FOLLOWING ZOSYN VANCOMYCIN  Final   Gram Stain   Final    RARE WBC PRESENT,BOTH PMN AND  MONONUCLEAR NO SQUAMOUS EPITHELIAL CELLS SEEN NO ORGANISMS SEEN Performed at [facility]    Culture   Final    NO ANAEROBES ISOLATED Performed at [facility]    Report Status 12/20/2013 FINAL  Final  Clostridium Difficile by PCR     Status: None   Collection Time: 12/16/13  5:34 PM  Result Value Ref Range Status   C difficile by pcr NEGATIVE NEGATIVE Final  Anaerobic culture     Status: None (Preliminary result)   Collection Time: 12/22/13  6:36 PM  Result Value Ref Range Status   Specimen Description ABSCESS BUTTOCKS  Final   Special Requests NONE  Final   Gram Stain   Final    RARE WBC PRESENT, PREDOMINANTLY PMN NO SQUAMOUS EPITHELIAL CELLS SEEN NO ORGANISMS SEEN Performed at [facility]    Culture   Final    NO ANAEROBES ISOLATED; CULTURE IN PROGRESS FOR 5 DAYS Performed at [facility]    Report Status PENDING  Incomplete  Culture, routine-abscess     Status: None (Preliminary result)   Collection Time: 12/22/13  6:36 PM  Result Value Ref Range Status   Specimen Description ABSCESS BUTTOCKS  Final   Special Requests NONE  Final   Gram Stain   Final    RARE WBC PRESENT, PREDOMINANTLY PMN NO SQUAMOUS EPITHELIAL CELLS SEEN NO ORGANISMS SEEN Performed at [facility]    Culture NO GROWTH Performed at [facility]   Final   Report Status PENDING  Incomplete   Studies/Results: No results found. Medications: I have reviewed the patient's current medications. Scheduled Meds: . amLODipine  5 mg Oral Daily  . antiseptic oral rinse  7 mL Mouth Rinse q12n4p  . chlorhexidine  15 mL Mouth Rinse BID  . docusate sodium  100 mg Oral BID  . feeding supplement (GLUCERNA SHAKE)  237 mL Oral BID BM  . HYDROcodone-acetaminophen  1-2 tablet Oral Q4H  . insulin aspart  0-15 Units Subcutaneous TID WC  . insulin glargine  35 Units Subcutaneous QHS  . pravastatin  20 mg Oral q1800  . senna  1 tablet Oral BID  . vancomycin  1,250  mg Intravenous Q24H  . warfarin  2 mg Oral ONCE-1800  . Warfarin - Pharmacist Dosing Inpatient   Does not apply q1800   Continuous Infusions: . sodium chloride 100 mL/hr at 12/23/13 1345   PRN Meds:.dextrose, HYDROmorphone (DILAUDID) injection, labetalol, menthol-cetylpyridinium, metoCLOPramide **OR** metoCLOPramide (REGLAN) injection, ondansetron (ZOFRAN) IV   Assessment/Plan:  Diabetic foot ulcer and abscess, right foot with sepsis:  S/p Bedside I&D 12/11/2013.  Wound cx growing few methicillin resistant  staph aureus.  S/p repeat I&D 12/14/2013. No organisms seen on repeat wound culture. D/c'd zosyn on day 9. Remains on IV vanc. Remains afebrile.  Appreciate orthopedics and ID recommendations/interventions.  S/p R BKA 12/22/2013. Leukocytosis downtrending. CIR consulted. - Norco 10/351m 1-2 tab q4hr pain, patient may refuse. - Docusate 1081mBID, senna BID - IV dilaudid 70m65m3h prn  R posterior hip cellulitis with focal 7mm65muid collection in superficial subcutaneous fat.  S/p I&D 12/22/2013. Cx with no organism seen. - continue IV Vancomycin. Will continue antibiotics for 2 weeks post op. End date 01/05/2014. - continue to monitor  Diarrhea, abdominal pain, nausea/vomiting: C diff PCR negative. Likely 2/2 antibiotic associated diarrhea. Diarrhea has resolved. CT abd pelvis with no acute findings. LFTs wnl except elevated Alk phos to 2 times the normal limit. GGT elevated indicating likely hepatic origin. RUQ US -[place]aild GB contraction, GB filled with sludge and small stones, murphy's sign negative, borderline GB wall thickness of 3.5mm,36m focal liver lesions. Improving.  - continue to monitor  - Zofran 4mg I370m6hr prn, Reglan 5-10mg Q73mprn for breakthrough  - pain control as above  Acute on chronic anemia: Anemia of chronic disease. Has undergone two transfusions this hospitalization. Hgb stable.  - Check FOBT  - Continue to monitor CBC. Transfuse pRBC prn.  Suspected Diabetic  ketoacidosis secondary to type 2 diabetes: Resolved.  Novolog 70/30 d/c due to patient with poor po and a few lows in the 60s.  CBG in the 200s yesterday and overnight.  - SSI mod, CBG q AC/HS  - Continue Lantus 35u qHS; careful increase due to poor po and prior low CBGs.  AKI on CKD stage 2: Cr decreased to 2.78 from 2.89. (baseline around 1.6). FEurea and FENa suggestive of pre-renal etiology  - Continue to hold nephrotoxins  - NS @ 100cc/hr  - strict I&Os   Hypertension:   - Holding home lisinopril-hydrochlorothiazide in the setting of AKI  - Hold home lasix 40mg da14mas above  - IV labetalol prn for SBP >180  - continue amlodipine 5mg dail34mFEN:   - Carb modified  - NS @ 100cc/hr   Hx Recurrent DVTs, DVT ppx: SCDs, warfarin  Dispo: Disposition is deferred at this time, awaiting improvement of current medical problems.  Anticipated discharge in approximately 1-2 day(s).   The patient does have a current PCP ([facility].  The patient does not have transportation limitations that hinder transportation to clinic appointments.  .Services Needed at time of discharge: Y = Yes, Blank = No PT: Rolling walker with 5" wheels, 3 in 1, Hospital bed, Wheelchair, Wheelchair cusion  OT:   RN:   Equipment:   Other:     LOS: 15 days   Jennifer Jacques Earthly6/2015, 9:30 AM

## 2013-12-24 NOTE — Progress Notes (Signed)
Physical Therapy Treatment Patient Details Name: [name] MRN: [id number] DOB: November 14, 1975 Today's Date: 12/24/2013    History of Present Illness Patient is a 38 y/o male admitted with non healing ulcer on right foot.  Also with buttock abcess.  Underwent I&D of right foot, then BKA 12/22/13 along with I&D of buttock abcess.    PT Comments    Patient progressing slowly with mobility. Increased time and encouragement to perform all transfers and mobility. Pain seems more controlled today allowing for more activity tolerance. Able to stand with assist of bariatric stedy and assist of 3. Instructed in there ex and positioning of right stump. Will continue to follow acutely.   Follow Up Recommendations  CIR     Equipment Recommendations  Hospital bed;Wheelchair (measurements PT);Wheelchair cushion (measurements PT);Rolling walker with 5" wheels;3in1 (PT)    Recommendations for Other Services       Precautions / Restrictions Precautions Precautions: Fall Restrictions Weight Bearing Restrictions: Yes RLE Weight Bearing: Non weight bearing Other Position/Activity Restrictions: new right BKA    Mobility  Bed Mobility Overal bed mobility: Needs Assistance;+2 for physical assistance Bed Mobility: Supine to Sit     Supine to sit: Min assist;HOB elevated     General bed mobility comments: Min A to mobilize R stump to EOB. Increased time and effort. Cues for technique.  Transfers Overall transfer level: Needs assistance Equipment used: None (bariatric stedy) Transfers: Sit to/from Stand Sit to Stand: Max assist;+2 physical assistance (Max A of 3)         General transfer comment: Able to stand with Max A of 3 from EOB and from stedy x1 with cues for technique to use UEs to pull hips forward and extend elbows/hips. Cues to extend R stump in standing.  Ambulation/Gait                 Stairs            Wheelchair Mobility    Modified Rankin (Stroke  Patients Only)       Balance Overall balance assessment: Needs assistance Sitting-balance support: Feet supported;No upper extremity supported Sitting balance-[name]: Good Sitting balance - Comments: Able to perform there ex sitting EOB shifting weight with no LOB.   Standing balance support: Bilateral upper extremity supported Standing balance-[name]: Poor Standing balance comment: Requires assist to stand within stedy with cues for elbow extension and hip extension. Leaning on right side as position of comfort - fatigues quickly. Able to stand for ~1 minute.                     Cognition Arousal/Alertness: Awake/alert Behavior During Therapy: WFL for tasks assessed/performed (Admits to crying ) Overall Cognitive Status: Within Functional Limits for tasks assessed                      Exercises General Exercises - Lower Extremity Quad Sets: Right;10 reps;Seated (3-5 sec hold ) Long Arc Quad: Right;5 reps;Seated Straight Leg Raises: Right;5 reps;Seated;AAROM (Quad set into assisted SLR) Other Exercises Other Exercises: Hip extension pushing into bed x10 RLE    General Comments        Pertinent Vitals/Pain Pain Assessment: 0-10 Pain Score:  (not rated) Pain Location: RLE with mobility (specifically near surgical site) Pain Descriptors / Indicators: Sore Pain Intervention(s): Monitored during session;Repositioned;Premedicated before session    Home Living Family/patient expects to be discharged to:: Inpatient rehab  Prior Function Level of Independence: Independent      Comments: Independent prior to admission on 12/09/13.   PT Goals (current goals can now be found in the care plan section) Progress towards PT goals: Progressing toward goals    Frequency  Min 4X/week    PT Plan Current plan remains appropriate    Co-evaluation PT/OT/SLP Co-Evaluation/Treatment: Yes Reason for Co-Treatment: For patient/therapist  safety PT goals addressed during session: Mobility/safety with mobility;Strengthening/ROM OT goals addressed during session: ADL's and self-care     End of Session Equipment Utilized During Treatment: Gait belt Activity Tolerance: Patient limited by fatigue;Patient tolerated treatment well Patient left: in chair;with call bell/phone within reach     Time: [id number] PT Time Calculation (min) (ACUTE ONLY): 48 min  Charges:  $Therapeutic Exercise: 8-22 mins $Therapeutic Activity: 8-22 mins                    G CodesCandy Sledge A 2013/12/31, 1:54 PM  Candy Sledge, [place], DPT [phone]

## 2013-12-24 NOTE — Progress Notes (Signed)
Rehab Admissions Coordinator Note:  Patient was screened by Retta Diones for appropriateness for an Inpatient Acute Rehab Consult.  At this time, we are recommending Inpatient Rehab consult.  Retta Diones 12/24/2013, 8:59 AM  I can be reached at [phone].

## 2013-12-24 NOTE — Consult Note (Signed)
Physical Medicine and Rehabilitation Consult  Reason for Consult: R-BKA due to foot abscess with wet gangrene Referring Physician: Dr. Dareen Piano.    HPI: [name] is a 38 y.o. male with history of DM type 2 with diabetic foot ulcer, morbid obesity,  HTN, CKD, on chronic coumadin for recurrent DVT/PE, medical non-compliance; who was admitted via MD office on 12/02 with fever chills, N/V and right foot pain. He was started on IV antibiotics for cellulitis right foot as well as IVF for AKI on CKD. MRI of right foot revealed skin ulcer on plantar surface 4th MTP with underlying abscess but no osteo or septic joint. Dr. Doran Durand was consulted for input and performed I and D at bedside with cultures. Cultures positive for MRSA. Patient developed recurrent fever with chills on 12/05 with progressive leucocytosis. Repeat MRI right foot on 12/06 revealed new abscess tracking medial plantar surface below 1st and 2nd MT with extensive edema due to cellulitis as well as myositis. He was taken to OR on 12/7 for I and D by Dr. Veverly Fells. Antibiotics narrowed to vancomycin.  Patient continued to have abdominal discomfort with N/V. Stool negative for C diff. CT abdomen/pelvis with atelectasis but no signs of infection. Patient with complaints of right hip pain and ultrasound with superficial abscess. Pain has improved but he continued to have persistent leucocytosis and ID consulted for input. Dr. Megan Salon recommended I and D of superficial right buttock abscess as well as continuing Vancomycin alone.   Patient continued to have poor wound healing with wet gangrene and failed attempts at limb salvage. He underwent R-BKA with I and D right buttock abscess by Dr. Doran Durand on 12/22/13. PT evaluation done yesterday and patient noted to have limitations with mobility as well as anxiety and fear with activity. Bio Tech consulted for [employer] device for limb protection. CIR recommended by MD and rehab team.   Patient has  right stump as well as right foot pain. Review of Systems  HENT: Negative for hearing loss.   Eyes: Negative for blurred vision and double vision.  Respiratory: Negative for cough and shortness of breath.   Cardiovascular: Negative for chest pain and palpitations.  Gastrointestinal: Negative for nausea, vomiting, diarrhea and constipation.  Genitourinary: Negative for urgency and frequency.  Musculoskeletal: Positive for joint pain. Negative for myalgias and back pain.  Neurological: Positive for weakness. Negative for dizziness, tingling and headaches.  Psychiatric/Behavioral: Positive for depression. The patient is nervous/anxious.       Past Medical History  Diagnosis Date  . DVT (deep venous thrombosis) 09/2002    patient reports additional DVTs in '06 & '11 (unconfirmed)  . Pulmonary embolism 09/2002    treated with 6 months of warfarin  . Hypertension   . Chest pain, neg MI, normal coronaries by cath 02/18/2013  . Hyperlipidemia 02/19/2013  . Acute venous embolism and thrombosis of deep vessels of proximal lower extremity 07/19/2011  . Type I diabetes mellitus dx'd 2001  . GERD (gastroesophageal reflux disease)   . Diabetic ulcer of right foot   . CKD (chronic kidney disease) stage 3, GFR 30-59 ml/min 02/19/2013  . Nephrotic syndrome     Past Surgical History  Procedure Laterality Date  . Incision and drainage abscess  2007; 2015    "back"  . Cardiac catheterization  02/18/2013    normal coronaries  . I&d extremity Right 12/14/2013    Procedure: IRRIGATION AND DEBRIDEMENT RIGHT FOOT;  Surgeon: [name], MD;  Location:  [facility];  Service: Orthopedics;  Laterality: Right;  . Left heart catheterization with coronary angiogram N/A 02/18/2013    Procedure: LEFT HEART CATHETERIZATION WITH CORONARY ANGIOGRAM;  Surgeon: [name], MD;  Location: [facility] CATH LAB;  Service: Cardiovascular;  Laterality: N/A;  . Amputation Right 12/22/2013    Procedure: AMPUTATION BELOW KNEE;   Surgeon: [name], MD;  Location: [facility];  Service: Orthopedics;  Laterality: Right;  . Incision and drainage of wound Right 12/22/2013    Procedure: I&D RIGHT BUTTOCK;  Surgeon: [name], MD;  Location: [facility];  Service: Orthopedics;  Laterality: Right;    Family History  Problem Relation Age of Onset  . Heart disease    . Diabetes    . Obesity      Social History:  Unemployed. Lives with mother. Independent without AD. He reports that he has never smoked. He has never used smokeless tobacco. He reports that he drinks alcohol. He reports that he does not use illicit drugs.    Allergies: No Known Allergies    Medications Prior to Admission  Medication Sig Dispense Refill  . furosemide (LASIX) 40 MG tablet Take 1 tablet (40 mg total) by mouth daily. 30 tablet   . insulin NPH-regular Human (NOVOLIN 70/30) (70-30) 100 UNIT/ML injection Inject 35-50 Units into the skin 2 (two) times daily with a meal. 50 units in the morning and 45 units in the evening (Patient taking differently: Inject 45-50 Units into the skin 2 (two) times daily with a meal. 50 units in the morning and 45 units in the evening) 10 mL 11  . lisinopril-hydrochlorothiazide (PRINZIDE,ZESTORETIC) 20-25 MG per tablet Take 1 tablet by mouth daily. 30 tablet 6  . lovastatin (MEVACOR) 20 MG tablet Take 1 tablet (20 mg total) by mouth daily. 30 tablet 11  . metFORMIN (GLUCOPHAGE) 500 MG tablet Take 2 tablets (1,000 mg total) by mouth 2 (two) times daily with a meal. 120 tablet 6  . ranitidine (ZANTAC) 300 MG tablet Take 1 tablet (300 mg total) by mouth at bedtime. 30 tablet 3  . warfarin (COUMADIN) 5 MG tablet Take 1.5 tablets (7.5 mg) all days of week EXCEPT on Mondays and Thursdays take 2 tablets. 50 tablet 1    Home: Home Living Family/patient expects to be discharged to:: Private residence Living Arrangements: Spouse/significant other, Parent Available Help at Discharge: Family Type of Home: House (townhouse at  mothers) Home Access: Stairs to enter Technical brewer of Steps: 4 steps to enter at girlfriend's  Entrance Stairs-Rails: Right Home Layout: One level Alternate Level Stairs-Number of Steps: two level at mother's with 12 steps to bedrooms Alternate Level Stairs-Rails: Left Home Equipment: None  Functional History: Prior Function Level of Independence: Independent Functional Status:  Mobility: Bed Mobility Overal bed mobility: Needs Assistance, + 2 for safety/equipment, +2 for physical assistance Bed Mobility: Supine to Sit, Sit to Supine Supine to sit: Min assist, HOB elevated Sit to supine: Supervision General bed mobility comments: increased time and effort up to EOB, min assist for trunk upright; to supine supervision Transfers Overall transfer level: Needs assistance Equipment used: Rolling walker (2 wheeled) Transfers: Sit to/from Stand Sit to Stand: From elevated surface, +2 physical assistance, Max assist Stand pivot transfers: Min assist General transfer comment: attempted x 3 from elevated surface with walker in front, kept coming forward and not lifting hips; concern for sliding in floor so did not attempt again Ambulation/Gait Ambulation/Gait assistance: Min assist, Mod assist Ambulation Distance (Feet): [place]  device: Rolling walker (2 wheeled) Gait Pattern/deviations: Step-to pattern, Step-through pattern, Decreased stance time - right, Decreased stride length, Antalgic, Wide base of support Gait velocity: decreased General Gait Details: Pt requires max verbal cues at times for safety, as he tends to let RW get too far ahead of him and demonstrates impulsivity when getting back into room to recliner.  Poor safety awareness when attempting to negotiate around obstacles in room.     ADL: ADL Overall ADL's : Needs assistance/impaired Eating/Feeding: Independent, Sitting Grooming: Wash/dry hands, Wash/dry face, Set up, Sitting Upper Body Bathing:  Supervision/ safety, Sitting Lower Body Bathing: Minimal assistance, Sit to/from stand Upper Body Dressing : Supervision/safety, Bed level Lower Body Dressing: Maximal assistance, Sitting/lateral leans (post op shoe) Toilet Transfer: Minimal assistance, Stand-pivot, BSC, Requires wide/bariatric Toilet Transfer Details (indicate cue type and reason): simulated having counter on the right and use of the RW on the left. Toileting- Clothing Manipulation and Hygiene: Minimal assistance, Sit to/from stand Tub/ Shower Transfer: Minimal assistance, Ambulation, Rolling walker Tub/Shower Transfer Details (indicate cue type and reason): simulated stepping over edge of tum with use of the RW for support and simulated tub wall. Functional mobility during ADLs: Minimal assistance, Rolling walker General ADL Comments: Pt with flat affect, requiring encouragement to work with OT.  Still not decided about what environment he will go upon d/c.  Cognition: Cognition Overall Cognitive Status: Within Functional Limits for tasks assessed Orientation Level: Oriented X4 Cognition Arousal/Alertness: Awake/alert Behavior During Therapy: WFL for tasks assessed/performed (admits to depressed/crying this morning) Overall Cognitive Status: Within Functional Limits for tasks assessed  Blood pressure 167/99, pulse 105, temperature 98.7 F (37.1 C), temperature source Oral, resp. rate 18, height 6\' 2"  (1.88 m), weight 160.029 kg (352 lb 12.8 oz), SpO2 94 %. Physical Exam  Nursing note and vitals reviewed. Constitutional: He is oriented to person, place, and time. He appears well-developed and well-nourished.  HENT:  Head: Normocephalic and atraumatic.  Eyes: Conjunctivae are normal. Pupils are equal, round, and reactive to light.  Neck: Normal range of motion. Neck supple.  Cardiovascular: Normal rate and regular rhythm.   Respiratory: Effort normal and breath sounds normal. No respiratory distress. He has no wheezes.   GI: Soft. Bowel sounds are normal. He exhibits no distension. There is no tenderness.  Musculoskeletal:  LLE with 1-2+ pedal edema  Neurological: He is alert and oriented to person, place, and time.  Skin: Skin is warm and dry.  Psychiatric:  Appears depressed and seems to be having a difficult hard time dealing with limb loss.   Motor strength is 5/5 bilateral deltoids, biceps, triceps, grip 3 minus left hip flexor 2 minus right hip flexor, 4 minus knee extensor and ankle dorsiflexion plantar flexor Sensation reduced in the left foot. Hypersensitive to touch right stump  Results for orders placed or performed during the hospital encounter of 12/09/13 (from the past 24 hour(s))  Glucose, capillary     Status: Abnormal   Collection Time: 12/23/13 12:15 PM  Result Value Ref Range   Glucose-Capillary 201 (H) 70 - 99 mg/dL  Urea nitrogen, urine     Status: None   Collection Time: 12/23/13 12:35 PM  Result Value Ref Range   Urea Nitrogen, Ur 541 mg/dL  Creatinine, urine, random     Status: None   Collection Time: 12/23/13 12:35 PM  Result Value Ref Range   Creatinine, Urine 154.74 mg/dL  Sodium, urine, random     Status: None   Collection Time: 12/23/13  12:35 PM  Result Value Ref Range   Sodium, Ur <20 mEq/L  CBC     Status: Abnormal   Collection Time: 12/23/13  4:00 PM  Result Value Ref Range   WBC 21.0 (H) 4.0 - 10.5 K/uL   RBC 2.52 (L) 4.22 - 5.81 MIL/uL   Hemoglobin 7.1 (L) 13.0 - 17.0 g/dL   HCT 21.5 (L) 39.0 - 52.0 %   MCV 85.3 78.0 - 100.0 fL   MCH 28.2 26.0 - 34.0 pg   MCHC 33.0 30.0 - 36.0 g/dL   RDW 17.2 (H) 11.5 - 15.5 %   Platelets 504 (H) 150 - 400 K/uL  Glucose, capillary     Status: Abnormal   Collection Time: 12/23/13  4:49 PM  Result Value Ref Range   Glucose-Capillary 204 (H) 70 - 99 mg/dL  Glucose, capillary     Status: Abnormal   Collection Time: 12/23/13  8:51 PM  Result Value Ref Range   Glucose-Capillary 159 (H) 70 - 99 mg/dL   Comment 1 Notify RN    Protime-INR     Status: Abnormal   Collection Time: 12/24/13  4:47 AM  Result Value Ref Range   Prothrombin Time 18.1 (H) 11.6 - 15.2 seconds   INR 1.48 0.00 - 1.49  CBC with Differential     Status: Abnormal   Collection Time: 12/24/13  4:47 AM  Result Value Ref Range   WBC 16.0 (H) 4.0 - 10.5 K/uL   RBC 2.62 (L) 4.22 - 5.81 MIL/uL   Hemoglobin 7.1 (L) 13.0 - 17.0 g/dL   HCT 22.4 (L) 39.0 - 52.0 %   MCV 85.5 78.0 - 100.0 fL   MCH 27.1 26.0 - 34.0 pg   MCHC 31.7 30.0 - 36.0 g/dL   RDW 16.8 (H) 11.5 - 15.5 %   Platelets 479 (H) 150 - 400 K/uL   Neutrophils Relative % 77 43 - 77 %   Lymphocytes Relative 13 12 - 46 %   Monocytes Relative 8 3 - 12 %   Eosinophils Relative 2 0 - 5 %   Basophils Relative 0 0 - 1 %   Neutro Abs 12.3 (H) 1.7 - 7.7 K/uL   Lymphs Abs 2.1 0.7 - 4.0 K/uL   Monocytes Absolute 1.3 (H) 0.1 - 1.0 K/uL   Eosinophils Absolute 0.3 0.0 - 0.7 K/uL   Basophils Absolute 0.0 0.0 - 0.1 K/uL   RBC Morphology POLYCHROMASIA PRESENT   Basic metabolic panel     Status: Abnormal   Collection Time: 12/24/13  4:47 AM  Result Value Ref Range   Sodium 137 137 - 147 mEq/L   Potassium 4.5 3.7 - 5.3 mEq/L   Chloride 105 96 - 112 mEq/L   CO2 20 19 - 32 mEq/L   Glucose, Bld 150 (H) 70 - 99 mg/dL   BUN 47 (H) 6 - 23 mg/dL   Creatinine, Ser 2.78 (H) 0.50 - 1.35 mg/dL   Calcium 8.3 (L) 8.4 - 10.5 mg/dL   GFR calc non Af Amer 27 (L) >90 mL/min   GFR calc Af Amer 32 (L) >90 mL/min   Anion gap 12 5 - 15  Glucose, capillary     Status: Abnormal   Collection Time: 12/24/13  7:15 AM  Result Value Ref Range   Glucose-Capillary 142 (H) 70 - 99 mg/dL   No results found.  Assessment/Plan: Diagnosis: Right BKA Related to diabetes with peripheral vascular disease 1. Does the need for close, 24 hr/day medical  supervision in concert with the patient's rehab needs make it unreasonable for this patient to be served in a less intensive setting? Yes 2. Co-Morbidities requiring  supervision/potential complications: Hypertension, morbid obesity, chronic kidney disease stage II 3. Due to bladder management, bowel management, safety, skin/wound care, disease management, medication administration, pain management and patient education, does the patient require 24 hr/day rehab nursing? Yes 4. Does the patient require coordinated care of a physician, rehab nurse, PT (1-2 hrs/day, 5 days/week) and OT (1-2 hrs/day, 5 days/week) to address physical and functional deficits in the context of the above medical diagnosis(es)? Yes Addressing deficits in the following areas: balance, endurance, locomotion, strength, transferring, bathing, dressing and toileting 5. Can the patient actively participate in an intensive therapy program of at least 3 hrs of therapy per day at least 5 days per week? Yes 6. The potential for patient to make measurable gains while on inpatient rehab is good 7. Anticipated functional outcomes upon discharge from inpatient rehab are modified independent  with PT, modified independent with OT, n/a with SLP. 8. Estimated rehab length of stay to reach the above functional goals is: 7-10 days 9. Does the patient have adequate social supports and living environment to accommodate these discharge functional goals? Potentially 10. Anticipated D/C setting: Home 11. Anticipated post D/C treatments: [facility] therapy 12. Overall Rehab/Functional Prognosis: excellent  RECOMMENDATIONS: This patient's condition is appropriate for continued rehabilitative care in the following setting: CIR Patient has agreed to participate in recommended program. Yes Note that insurance prior authorization may be required for reimbursement for recommended care.  Comment:     12/24/2013

## 2013-12-24 NOTE — Progress Notes (Signed)
Pt still unable to urinate. Bladder scan done, shows 721ml in the bladder. Got an in and out cath order from Dr Arcelia Jew. Hg level of 7.1 this pm also relayed.

## 2013-12-24 NOTE — Progress Notes (Signed)
Notified MD of pt not voiding since last in and out cath last night at 2330. Pt also refused for NT to bladder scan. MD reported to give patient a few more hours to try to urinate then attempt bladder scan again.

## 2013-12-24 NOTE — Progress Notes (Signed)
Orthopedic Tech Progress Note Patient Details:  [name] 1975/04/09 [id number]  Patient ID: [name], male   DOB: Oct 23, 1975, 38 y.o.   MRN: [id number] Called in bio-tech brace order; spoke with Dolores Lory, Luan Maberry 12/24/2013, 9:39 AM

## 2013-12-24 NOTE — Progress Notes (Signed)
Orthopedic Tech Progress Note Patient Details:  [name] 06-12-1975 [id number]  Patient ID: [name], male   DOB: 09/21/1975, 38 y.o.   MRN: [id number] Brace order completed by Warnell Forester, [name] 12/24/2013, 1:13 PM

## 2013-12-24 NOTE — Progress Notes (Signed)
NUTRITION FOLLOW UP  Intervention:    Continue Glucerna Shake PO BID, each supplement provides 220 kcal and 10 grams of protein  Recommend Multivitamin with minerals daily  Nutrition Dx:   Increased nutrient needs related to wound healing as evidenced by diabetic ulcer on foot; ongoing  Goal:   Pt to meet >/= 90% of their estimated nutrition needs, progressing.  Monitor:   Weight trend, po intake, acceptance of supplements, labs  Assessment:   38 year old african [place] gentleman with type 2 diabetes on insulin, obesity, recurrent DVT on chronic coumadin presenting with a chronic (1 year) non-healing right foot plantar ulcer.   S/p I&D of rt diabetic foot abscess and surgical debridement on 12/15/13.   S/P BKA and I&D of buttock abscess 12/22/13.  Pt reports that his intake and appetite are improving, but remain fair. For breakfast he reports eating eggs, orange juice and a small bagel. Pt is drinking Glucerna Shakes. Refused Prostat supplements. Pt says that his lack of appetite are due to "dealing with everything going on."  Labs: CBG's: 142-159 Na and K WNL BUN elevated  Height: Ht Readings from Last 1 Encounters:  12/10/13 6\' 2"  (1.88 m)    Weight Status:   Wt Readings from Last 1 Encounters:  12/24/13 352 lb 12.8 oz (160.029 kg)   12/10/13 323 lb 4.8 oz (146.648 kg)   Re-estimated needs:  Kcal: 2400-2600 Protein: 128-138 grams Fluid: 2.4-2.6 L  Skin: BKA of right leg, abscess on buttocks  Diet Order: Diet Carb Modified   Intake/Output Summary (Last 24 hours) at 12/24/13 1216 Last data filed at 12/24/13 1042  Gross per 24 hour  Intake 3208.67 ml  Output   1100 ml  Net 2108.67 ml    Last BM: 12/14   Labs:   Recent Labs Lab 12/22/13 0454 12/23/13 0424 12/24/13 0447  NA 139 139 137  K 4.4 5.4* 4.5  CL 104 106 105  CO2 18* 19 20  BUN 50* 52* 47*  CREATININE 2.30* 2.89* 2.78*  CALCIUM 9.1 8.3* 8.3*  GLUCOSE 309* 270* 150*    CBG (last  3)   Recent Labs  12/23/13 2051 12/24/13 0715 12/24/13 1147  GLUCAP 159* 142* 153*   Lab Results  Component Value Date   HGBA1C 12.8 08/14/2013   Scheduled Meds: . amLODipine  5 mg Oral Daily  . antiseptic oral rinse  7 mL Mouth Rinse q12n4p  . chlorhexidine  15 mL Mouth Rinse BID  . docusate sodium  100 mg Oral BID  . feeding supplement (GLUCERNA SHAKE)  237 mL Oral BID BM  . HYDROcodone-acetaminophen  1-2 tablet Oral Q4H  . insulin aspart  0-15 Units Subcutaneous TID WC  . insulin glargine  35 Units Subcutaneous QHS  . pravastatin  20 mg Oral q1800  . senna  1 tablet Oral BID  . vancomycin  1,250 mg Intravenous Q24H  . warfarin  2 mg Oral ONCE-1800  . Warfarin - Pharmacist Dosing Inpatient   Does not apply q1800    Continuous Infusions: . sodium chloride Stopped (12/24/13 1156)     Laurette Schimke MS, RD, LDN

## 2013-12-24 NOTE — Progress Notes (Signed)
ANTICOAGULATION CONSULT NOTE - Follow Up Consult  Pharmacy Consult for Warfarin Indication: Hx recurrent DVTs  No Known Allergies  Patient Measurements: Height: 6\' 2"  (188 cm) Weight: (!) 352 lb 12.8 oz (160.029 kg) IBW/kg (Calculated) : 82.2  Vital Signs: Temp: 98.7 F (37.1 C) (12/16 0429) Temp Source: Oral (12/16 0429) BP: 167/99 mmHg (12/16 0429) Pulse Rate: 105 (12/16 0429)  Labs:  Recent Labs  12/22/13 0454 12/23/13 0424 12/23/13 1600 12/24/13 0447  HGB 9.4* 8.0* 7.1* 7.1*  HCT 29.8* 24.8* 21.5* 22.4*  PLT 804* 561* 504* 479*  LABPROT 18.1* 17.5*  --  18.1*  INR 1.48 1.42  --  1.48  CREATININE 2.30* 2.89*  --  2.78*    Estimated Creatinine Clearance: 58.3 mL/min (by C-G formula based on Cr of 2.78).   Assessment: 87 YOM on warfarin PTA for hx of recurrent DVTs. INR on admission was SUBtherapeutic and had a rapid rise when doses were initiated on 12/4. At this time, the cause of the rapid rise is unknown - LFTs are wnl. INR was reversed with Vit K 5 mg IV on 12/13 for R-BKA done on 12/14. Pharmacy was consulted to resume warfarin post-op on 12/14 however RN held the dose per discussion with the primary team. Discussed with the primary team today and clarified that warfarin to be resumed this 12/15.     INR today is SUBtherapeutic (1.48, goal of 2-3). The patient was noted to have a slow trend down in Hgb requiring 2 units PRBC over 12/7-12/8 and 12/12 and again on 12/14.  Hgb now 7.1  likely d/t procedure yesterday, plts 479. Given the patient's trend in INR rise this admission - will start with a low warfarin dose.  Goal of Therapy:  INR 2-3   Plan:  1. Repeat Warfarin 2 mg x 1 dose at 1800 today 2. Will continue to monitor for any signs/symptoms of bleeding and will follow up with PT/INR daily  Eudelia Bunch, Pharm.D. [id number] 12/24/2013 9:12 AM

## 2013-12-24 NOTE — Progress Notes (Signed)
Physical Therapy Treatment Patient Details Name: [name] MRN: [id number] DOB: 02/14/1975 Today's Date: 12/24/2013    History of Present Illness Patient is a 38 y/o male admitted with non healing ulcer on right foot.  Also with buttock abcess.  Underwent I&D of right foot, then BKA 12/22/13 along with I&D of buttock abcess.    PT Comments    Patient required more assist standing from lower surface (recliner) this PM with increased cueing most likely due to weakness, fatigue and pain from earlier session. Increased pain and time for all mobility. Encouragement required throughout session. PT frequency adjusted as RN requested assist with transferring pt back to bed in the PM. Will continue to follow acutely.   Follow Up Recommendations  CIR     Equipment Recommendations  Hospital bed;Wheelchair (measurements PT);Wheelchair cushion (measurements PT);Rolling walker with 5" wheels;3in1 (PT)    Recommendations for Other Services       Precautions / Restrictions Precautions Precautions: Fall Restrictions Weight Bearing Restrictions: Yes RLE Weight Bearing: Non weight bearing Other Position/Activity Restrictions: new right BKA    Mobility  Bed Mobility Overal bed mobility: Needs Assistance;+2 for physical assistance Bed Mobility: Sit to Supine     Supine to sit: Mod assist     General bed mobility comments: Received sitting in chair upon PT arrival. Mod A to assist with BLEs to return to bed.  Transfers Overall transfer level: Needs assistance Equipment used: None Transfers: Sit to/from Stand Sit to Stand: +2 physical assistance;Total assist;Max assist Stand pivot transfers: Min assist       General transfer comment: Required multiple attempts to stand due to weakness/fatigue and pain in right stump. Stood from Psychologist, occupational. Total A of 2 with cues for technique and use of forward momentum to assist with elevating hips. Stood from [employer] Max A of 2 (higher surface)  with cues for UE extension.  Ambulation/Gait                 Stairs            Wheelchair Mobility    Modified Rankin (Stroke Patients Only)       Balance Overall balance assessment: Needs assistance Sitting-balance support: Feet supported;Single extremity supported Sitting balance-[name]: Fair Sitting balance - Comments: Difficulty bringing trunk to upright seated position in recliner, Min A.   Standing balance support: Bilateral upper extremity supported Standing balance-[name]: Poor Standing balance comment: Requires assist upon standing within stedy with manual cues for hip and elbow extension.                    Cognition Arousal/Alertness: Awake/alert Behavior During Therapy: WFL for tasks assessed/performed Overall Cognitive Status: Within Functional Limits for tasks assessed                      Exercises General Exercises - Lower Extremity Quad Sets: Right;10 reps;Seated (3-5 sec hold ) Long Arc Quad: Right;5 reps;Seated Straight Leg Raises: Right;5 reps;Seated;AAROM (Quad set into assisted SLR) Other Exercises Other Exercises: encouraged pt to perform chair pushups to increase tricep strength for use with RW    General Comments        Pertinent Vitals/Pain Pain Assessment: 0-10 Pain Score:  (not rated on pain scale.) Pain Location: RLE with mobility. Pain Descriptors / Indicators: Sore;Sharp Pain Intervention(s): Monitored during session;Premedicated before session;Repositioned    Home Living Family/patient expects to be discharged to:: Inpatient rehab  Prior Function Level of Independence: Independent      Comments: Independent prior to admission on 12/09/13.   PT Goals (current goals can now be found in the care plan section) Acute Rehab PT Goals Patient Stated Goal: to get a prosthesis and run a tough mudder Progress towards PT goals: Progressing toward goals    Frequency  Min  4X/week    PT Plan Current plan remains appropriate    Co-evaluation PT/OT/SLP Co-Evaluation/Treatment: Yes Reason for Co-Treatment: For patient/therapist safety PT goals addressed during session: Mobility/safety with mobility;Strengthening/ROM OT goals addressed during session: ADL's and self-care     End of Session Equipment Utilized During Treatment: Gait belt Activity Tolerance: Patient limited by pain;Patient limited by fatigue Patient left: in bed;with call bell/phone within reach;with nursing/sitter in room;with family/visitor present     Time: [id number] PT Time Calculation (min) (ACUTE ONLY): 20 min  Charges:  $Therapeutic Exercise: 8-22 mins $Therapeutic Activity: 8-22 mins                    G CodesCandy Sledge A Jan 05, 2014, 4:22 PM  Candy Sledge, PT, DPT [phone]

## 2013-12-24 NOTE — Progress Notes (Signed)
Subjective: 2 Days Post-Op Procedure(s) (LRB): AMPUTATION BELOW KNEE (Right) I&D RIGHT BUTTOCK (Right) Patient reports pain as moderate.  N/v resolved.  OOB with PT.  Stump protector placed by [employer].  Objective: Vital signs in last 24 hours: Temp:  [98.7 F (37.1 C)-99.3 F (37.4 C)] 98.7 F (37.1 C) (12/16 0429) Pulse Rate:  [105-108] 105 (12/16 0429) Resp:  [18] 18 (12/16 0429) BP: (141-167)/(76-100) 167/99 mmHg (12/16 0429) SpO2:  [93 %-97 %] 94 % (12/16 0429) Weight:  [160.029 kg (352 lb 12.8 oz)] 160.029 kg (352 lb 12.8 oz) (12/16 0429)  Intake/Output from previous day: 12/15 0701 - 12/16 0700 In: 3208.7 [P.O.:970; I.V.:2238.7] Out: 1700 [Urine:1700] Intake/Output this shift: Total I/O In: 240 [P.O.:240] Out: 1100 [Urine:1100]   Recent Labs  12/22/13 0454 12/23/13 0424 12/23/13 1600 12/24/13 0447  HGB 9.4* 8.0* 7.1* 7.1*    Recent Labs  12/23/13 1600 12/24/13 0447  WBC 21.0* 16.0*  RBC 2.52* 2.62*  HCT 21.5* 22.4*  PLT 504* 479*    Recent Labs  12/23/13 0424 12/24/13 0447  NA 139 137  K 5.4* 4.5  CL 106 105  CO2 19 20  BUN 52* 47*  CREATININE 2.89* 2.78*  GLUCOSE 270* 150*  CALCIUM 8.3* 8.3*    Recent Labs  12/23/13 0424 12/24/13 0447  INR 1.42 1.48    PE:  R bk stump dressed and dry with protector in place.  Assessment/Plan: 2 Days Post-Op Procedure(s) (LRB): AMPUTATION BELOW KNEE (Right) I&D RIGHT BUTTOCK (Right) Dressing change tomorrow for stump.  Pack in buttock to be changed by RN.  OOB with PT.   Danita Proud 12/24/2013, 1:11 PM

## 2013-12-24 NOTE — Progress Notes (Signed)
Occupational Therapy Evaluation Patient Details Name: [name] MRN: [id number] DOB: [birth year] Today's Date: 12/24/2013    History of Present Illness Patient is a 38 y/o male admitted with non healing ulcer on right foot.  Also with buttock abcess.  Underwent I&D of right foot, then BKA 12/22/13 along with I&D of buttock abcess.   Clinical Impression   Pt now with R BKA and NWB status on RLE. Pt with significant pain in RLE and generalized weakness which limits his independence with ADLs. Pt requires Max A +3 for sit<>stand with use of bariatric stedy today and will require max/total A for LB ADLs. Pt is hopeful to pursue prosthetic and would be a good CIR candidate. Pt is progressing slowly, but feel that he participated more in today's OT/PT session.     Follow Up Recommendations  CIR;Supervision/Assistance - 24 hour    Equipment Recommendations  Tub/shower bench    Recommendations for Other Services       Precautions / Restrictions Precautions Precautions: Fall Restrictions Weight Bearing Restrictions: Yes RLE Weight Bearing: Non weight bearing Other Position/Activity Restrictions: new right BKA      Mobility Bed Mobility Overal bed mobility: Needs Assistance;+2 for physical assistance Bed Mobility: Supine to Sit     Supine to sit: Min assist;HOB elevated     General bed mobility comments: Min A to mobilize R stump to EOB. Increased time and effort. Cues for technique.  Transfers Overall transfer level: Needs assistance Equipment used: None (bariatric stedy) Transfers: Sit to/from Stand Sit to Stand: Max assist;+2 physical assistance (max A of 3) Stand pivot transfers: Min assist       General transfer comment: Able to stand with Max A of 3 from EOB and from stedy x1 with cues for technique to use UEs to pull hips forward and extend elbows/hips. Cues to extend R stump in standing.    Balance Overall balance assessment: Needs assistance Sitting-balance  support: Feet supported;No upper extremity supported Sitting balance-[name]: Good Sitting balance - Comments: Able to perform there ex sitting EOB shifting weight with no LOB.   Standing balance support: Bilateral upper extremity supported Standing balance-[name]: Poor Standing balance comment: Requires assist to stand within stedy with cues for elbow extension and hip extension. Leaning on right side as position of comfort - fatigues quickly. Able to stand for ~1 minute.                             ADL Overall ADL's : Needs assistance/impaired Eating/Feeding: Independent;Sitting   Grooming: Oral care;Set up;Sitting   Upper Body Bathing: Supervision/ safety;Sitting   Lower Body Bathing: Maximal assistance;+2 for physical assistance;Sit to/from stand (+3 for safety)   Upper Body Dressing : Supervision/safety;Bed level   Lower Body Dressing: Total assistance;+2 for physical assistance;Sit to/from stand (+3 for safety)   Toilet Transfer: Maximal assistance;+2 for physical assistance Toilet Transfer Details (indicate cue type and reason): pt performed sit>stand from bed using bariatric steady and transfered to recliner chair with +3 max (A)           General ADL Comments: Pt repeatedly mentioned difficulty coping with BKA. Pt is limited by LLE weakness and pain which impair his independence.      Vision  No apparent deficits.                    Perception Perception Perception Tested?: No   Praxis Praxis Praxis tested?: Within functional  limits    Pertinent Vitals/Pain Pain Assessment: 0-10 Pain Score:  (not rated on pain scale) Pain Location: RLE with mobility (specifically near surgical site) Pain Descriptors / Indicators: Sore Pain Intervention(s): Monitored during session;Premedicated before session;Repositioned     Hand Dominance Right   Extremity/Trunk Assessment Upper Extremity Assessment Upper Extremity Assessment: Overall WFL for  tasks assessed   Lower Extremity Assessment Lower Extremity Assessment: Defer to PT evaluation   Cervical / Trunk Assessment Cervical / Trunk Assessment: Normal   Communication Communication Communication: No difficulties   Cognition Arousal/Alertness: Awake/alert Behavior During Therapy: WFL for tasks assessed/performed (admits to crying earlier) Overall Cognitive Status: Within Functional Limits for tasks assessed                     General Comments   Pt required some encouragement, however participated when allowed time to mentally prepare for mobility. Pt expressed difficulty coping with BKA. Educated pt on pre-prosthetic training including desensitization and deep breathing for pain control.     Exercises Exercises: Other exercises Other Exercises Other Exercises: encouraged pt to perform chair pushups to increase tricep strength for use with RW   Shoulder Instructions      Home Living Family/patient expects to be discharged to:: Inpatient rehab                                        Prior Functioning/Environment Level of Independence: Independent        Comments: Independent prior to admission on 12/09/13.    OT Diagnosis: Generalized weakness;Acute pain   OT Problem List: Decreased strength;Decreased range of motion;Decreased activity tolerance;Impaired balance (sitting and/or standing);Decreased safety awareness;Decreased knowledge of use of DME or AE;Decreased knowledge of precautions;Pain;Obesity   OT Treatment/Interventions: Self-care/ADL training;Therapeutic exercise;Energy conservation;DME and/or AE instruction;Therapeutic activities;Patient/family education;Balance training    OT Goals(Current goals can be found in the care plan section) Acute Rehab OT Goals Patient Stated Goal: to get a prosthesis and run a tough mudder OT Goal Formulation: With patient Time For Goal Achievement: 01/07/14 Potential to Achieve Goals: Good ADL  Goals Pt Will Perform Grooming: with min assist;standing Pt Will Perform Lower Body Bathing: with min assist;with adaptive equipment;sit to/from stand Pt Will Perform Lower Body Dressing: with min assist;with adaptive equipment;sit to/from stand Pt Will Transfer to Toilet: with min assist;stand pivot transfer;bedside commode Pt Will Perform Toileting - Clothing Manipulation and hygiene: with min assist;sit to/from stand Pt Will Perform Tub/Shower Transfer: Tub transfer;with min assist;ambulating;tub bench;rolling walker  OT Frequency: Min 2X/week           Co-evaluation PT/OT/SLP Co-Evaluation/Treatment: Yes Reason for Co-Treatment: For patient/therapist safety PT goals addressed during session: Mobility/safety with mobility;Strengthening/ROM OT goals addressed during session: ADL's and self-care      End of Session Equipment Utilized During Treatment: Other (comment) (bariatric stedy) Nurse Communication: Mobility status;Need for lift equipment;Other (comment) (technique with bariatric stedy)  Activity Tolerance: Patient tolerated treatment well Patient left: in chair;with call bell/phone within reach   Time: [id number] OT Time Calculation (min): 48 min Charges:  OT General Charges $OT Visit: 1 Procedure OT Evaluation $OT Re-eval: 1 Procedure OT Treatments $Self Care/Home Management : 8-22 mins  Villa Herb M 12/24/2013, 2:30 PM   Cyndie Chime, OTR/L Occupational Therapist [phone] (pager)

## 2013-12-25 DIAGNOSIS — Z89511 Acquired absence of right leg below knee: Secondary | ICD-10-CM

## 2013-12-25 DIAGNOSIS — I129 Hypertensive chronic kidney disease with stage 1 through stage 4 chronic kidney disease, or unspecified chronic kidney disease: Secondary | ICD-10-CM

## 2013-12-25 DIAGNOSIS — E1122 Type 2 diabetes mellitus with diabetic chronic kidney disease: Secondary | ICD-10-CM

## 2013-12-25 DIAGNOSIS — Z794 Long term (current) use of insulin: Secondary | ICD-10-CM

## 2013-12-25 DIAGNOSIS — R339 Retention of urine, unspecified: Secondary | ICD-10-CM

## 2013-12-25 LAB — CBC WITH DIFFERENTIAL/PLATELET
BASOS ABS: 0 10*3/uL (ref 0.0–0.1)
Basophils Relative: 0 % (ref 0–1)
EOS ABS: 0.3 10*3/uL (ref 0.0–0.7)
Eosinophils Relative: 2 % (ref 0–5)
HEMATOCRIT: 21.3 % — AB (ref 39.0–52.0)
Hemoglobin: 6.9 g/dL — CL (ref 13.0–17.0)
LYMPHS PCT: 11 % — AB (ref 12–46)
Lymphs Abs: 1.5 10*3/uL (ref 0.7–4.0)
MCH: 28.3 pg (ref 26.0–34.0)
MCHC: 32.4 g/dL (ref 30.0–36.0)
MCV: 87.3 fL (ref 78.0–100.0)
MONOS PCT: 8 % (ref 3–12)
Monocytes Absolute: 1.1 10*3/uL — ABNORMAL HIGH (ref 0.1–1.0)
NEUTROS PCT: 79 % — AB (ref 43–77)
Neutro Abs: 10.9 10*3/uL — ABNORMAL HIGH (ref 1.7–7.7)
PLATELETS: 435 10*3/uL — AB (ref 150–400)
RBC: 2.44 MIL/uL — ABNORMAL LOW (ref 4.22–5.81)
RDW: 16.3 % — ABNORMAL HIGH (ref 11.5–15.5)
WBC: 13.8 10*3/uL — AB (ref 4.0–10.5)

## 2013-12-25 LAB — HEMOGLOBIN AND HEMATOCRIT, BLOOD
HEMATOCRIT: 20.8 % — AB (ref 39.0–52.0)
Hemoglobin: 6.5 g/dL — CL (ref 13.0–17.0)

## 2013-12-25 LAB — GLUCOSE, CAPILLARY
GLUCOSE-CAPILLARY: 132 mg/dL — AB (ref 70–99)
Glucose-Capillary: 93 mg/dL (ref 70–99)
Glucose-Capillary: 113 mg/dL — ABNORMAL HIGH (ref 70–99)
Glucose-Capillary: 138 mg/dL — ABNORMAL HIGH (ref 70–99)

## 2013-12-25 LAB — PROTIME-INR
INR: 1.31 (ref 0.00–1.49)
Prothrombin Time: 16.4 seconds — ABNORMAL HIGH (ref 11.6–15.2)

## 2013-12-25 LAB — BASIC METABOLIC PANEL
Anion gap: 11 (ref 5–15)
BUN: 37 mg/dL — ABNORMAL HIGH (ref 6–23)
CALCIUM: 8 mg/dL — AB (ref 8.4–10.5)
CO2: 18 mEq/L — ABNORMAL LOW (ref 19–32)
Chloride: 109 mEq/L (ref 96–112)
Creatinine, Ser: 2.24 mg/dL — ABNORMAL HIGH (ref 0.50–1.35)
GFR calc Af Amer: 41 mL/min — ABNORMAL LOW (ref >90)
GFR, EST NON AFRICAN AMERICAN: 36 mL/min — AB (ref >90)
GLUCOSE: 95 mg/dL (ref 70–99)
POTASSIUM: 4.6 meq/L (ref 3.7–5.3)
Sodium: 138 mEq/L (ref 137–147)

## 2013-12-25 LAB — PREPARE RBC (CROSSMATCH)

## 2013-12-25 MED ORDER — AMLODIPINE BESYLATE 5 MG PO TABS
5.0000 mg | ORAL_TABLET | Freq: Once | ORAL | Status: AC
  Administered 2013-12-25: 5 mg via ORAL
  Filled 2013-12-25: qty 1

## 2013-12-25 MED ORDER — SODIUM CHLORIDE 0.9 % IV SOLN
Freq: Once | INTRAVENOUS | Status: AC
  Administered 2013-12-25: 13:00:00 via INTRAVENOUS

## 2013-12-25 MED ORDER — AMLODIPINE BESYLATE 10 MG PO TABS: 10.0000 mg | ORAL_TABLET | Freq: Every day | ORAL | Status: DC

## 2013-12-25 MED ORDER — AMLODIPINE BESYLATE 5 MG PO TABS: 5.0000 mg | ORAL_TABLET | Freq: Once | ORAL | Status: AC

## 2013-12-25 MED ORDER — AMLODIPINE BESYLATE 10 MG PO TABS
10.0000 mg | ORAL_TABLET | Freq: Every day | ORAL | Status: DC
  Administered 2013-12-26: 10 mg via ORAL
  Filled 2013-12-25: qty 1

## 2013-12-25 MED ORDER — WARFARIN SODIUM 2 MG PO TABS
2.0000 mg | ORAL_TABLET | Freq: Once | ORAL | Status: AC
  Administered 2013-12-25: 2 mg via ORAL
  Filled 2013-12-25: qty 1

## 2013-12-25 NOTE — Progress Notes (Signed)
   12/25/13 1500  Clinical Encounter Type  Visited With Patient  Visit Type Follow-up  Spiritual Encounters  Spiritual Needs Emotional  Stress Factors  Patient Stress Factors Health changes   Chaplain followed up with patient this afternoon. Patient received a call from a cousin and wanted to share the words his cousin gave him with the chaplain. Chaplain provided feedback for the cousin's words. Chaplain affirmed that the patient can still be what he wants to be after this altering amputation. Patient wants to keep going in this journey and not quit because he has young nephews looking to him for guidance. Patient has appreciated chaplain's consistency, support, and counseling. Patient has asked for follow up from chaplain. Chaplain will follow up with patient if patient moved to inpatient rehab unit.  Gar Ponto, Chaplain  3:48 PM

## 2013-12-25 NOTE — Progress Notes (Signed)
Subjective: Improved pain control with toleration of diet. No other complaints this morning. Still reporting hesitancy with urination, though was able to urinate two times.   Objective: Vital signs in last 24 hours: Filed Vitals:   12/24/13 0429 12/24/13 1435 12/24/13 2100 12/25/13 0300  BP: 167/99 167/76 148/74 150/84  Pulse: 105 105 109 106  Temp: 98.7 F (37.1 C) 98.7 F (37.1 C) 99.5 F (37.5 C) 99.2 F (37.3 C)  TempSrc: Oral Oral Oral   Resp: 18 18 20 24   Height:      Weight: 352 lb 12.8 oz (160.029 kg)   383 lb 3.2 oz (173.818 kg)  SpO2: 94% 95% 100% 100%   Weight change: 30 lb 6.4 oz (13.789 kg)  Intake/Output Summary (Last 24 hours) at 12/25/13 0932 Last data filed at 12/25/13 0600  Gross per 24 hour  Intake 2043.33 ml  Output   2900 ml  Net -856.67 ml   General: NAD HEENT: [place]/AT Cardiac: RRR, no rubs, murmurs or gallops Pulm: clear to auscultation bilaterally, moving normal volumes of air Abd: soft, + BS, nondistended, nontender Ext: +1 B/L LE edema, dressing in place over R BKA without strikethrough Neuro: alert and oriented X3, responding appropriately, follows commands  Lab Results: Basic Metabolic Panel:  Recent Labs Lab 12/24/13 0447 12/25/13 0519  NA 137 138  K 4.5 4.6  CL 105 109  CO2 20 18*  GLUCOSE 150* 95  BUN 47* 37*  CREATININE 2.78* 2.24*  CALCIUM 8.3* 8.0*   Liver Function Tests:  Recent Labs Lab 12/21/13 0410 12/22/13 0454  AST 18 16  ALT 22 21  ALKPHOS 316* 246*  BILITOT 0.6 0.6  PROT 8.2 9.1*  ALBUMIN 2.0* 2.2*   CBC:  Recent Labs Lab 12/24/13 0447 12/25/13 0519 12/25/13 0800  WBC 16.0* 13.8*  --   NEUTROABS 12.3* 10.9*  --   HGB 7.1* 6.9* 6.5*  HCT 22.4* 21.3* 20.8*  MCV 85.5 87.3  --   PLT 479* 435*  --    CBG:  Recent Labs Lab 12/23/13 2051 12/24/13 0715 12/24/13 1147 12/24/13 1735 12/24/13 2110 12/25/13 0732  GLUCAP 159* 142* 153* 159* 147* 93   Coagulation:  Recent Labs Lab  12/22/13 0454 12/23/13 0424 12/24/13 0447 12/25/13 0519  LABPROT 18.1* 17.5* 18.1* 16.4*  INR 1.48 1.42 1.48 1.31   Urine Drug Screen: Drugs of Abuse     Component Value Date/Time   LABOPIA POSITIVE* 07/12/2011 0038   COCAINSCRNUR NONE DETECTED 07/12/2011 0038   LABBENZ NONE DETECTED 07/12/2011 0038   AMPHETMU NONE DETECTED 07/12/2011 0038   THCU NONE DETECTED 07/12/2011 0038   LABBARB NONE DETECTED 07/12/2011 0038    Micro Results: Recent Results (from the past 240 hour(s))  Clostridium Difficile by PCR     Status: None   Collection Time: 12/16/13  5:34 PM  Result Value Ref Range Status   C difficile by pcr NEGATIVE NEGATIVE Final  Anaerobic culture     Status: None (Preliminary result)   Collection Time: 12/22/13  6:36 PM  Result Value Ref Range Status   Specimen Description ABSCESS BUTTOCKS  Final   Special Requests NONE  Final   Gram Stain   Final    RARE WBC PRESENT, PREDOMINANTLY PMN NO SQUAMOUS EPITHELIAL CELLS SEEN NO ORGANISMS SEEN Performed at [facility]    Culture   Final    NO ANAEROBES ISOLATED; CULTURE IN PROGRESS FOR 5 DAYS Performed at [facility]    Report Status  PENDING  Incomplete  Culture, routine-abscess     Status: None (Preliminary result)   Collection Time: 12/22/13  6:36 PM  Result Value Ref Range Status   Specimen Description ABSCESS BUTTOCKS  Final   Special Requests NONE  Final   Gram Stain   Final    RARE WBC PRESENT, PREDOMINANTLY PMN NO SQUAMOUS EPITHELIAL CELLS SEEN NO ORGANISMS SEEN Performed at [facility]    Culture NO GROWTH Performed at [facility]   Final   Report Status PENDING  Incomplete   Studies/Results: No results found. Medications: I have reviewed the patient's current medications. Scheduled Meds: . amLODipine  5 mg Oral Daily  . antiseptic oral rinse  7 mL Mouth Rinse q12n4p  . chlorhexidine  15 mL Mouth Rinse BID  . docusate sodium  100 mg Oral BID  . feeding  supplement (GLUCERNA SHAKE)  237 mL Oral BID BM  . HYDROcodone-acetaminophen  1-2 tablet Oral Q4H  . insulin aspart  0-15 Units Subcutaneous TID WC  . insulin glargine  35 Units Subcutaneous QHS  . pravastatin  20 mg Oral q1800  . senna  1 tablet Oral BID  . vancomycin  1,250 mg Intravenous Q24H  . Warfarin - Pharmacist Dosing Inpatient   Does not apply q1800   Continuous Infusions: . sodium chloride 100 mL/hr at 12/25/13 0449   PRN Meds:.dextrose, HYDROmorphone (DILAUDID) injection, labetalol, menthol-cetylpyridinium, metoCLOPramide **OR** metoCLOPramide (REGLAN) injection, ondansetron (ZOFRAN) IV   Assessment/Plan:  Diabetic foot ulcer and abscess, right foot with sepsis:  S/p Bedside I&D 12/11/2013.  Wound cx growing few methicillin resistant staph aureus.  S/p repeat I&D 12/14/2013. No organisms seen on repeat wound culture. D/c'd zosyn on day 9. Remains on IV vanc. Remains afebrile.  Appreciate orthopedics and ID recommendations/interventions.  S/p R BKA 12/22/2013. Leukocytosis downtrending. CIR consulted and awaiting bed availability - Norco 10/360m 1-2 tab q4hr pain, patient may refuse. - Docusate 1097mBID, senna BID - IV dilaudid 45m75m3h prn  Urinary retention: Two episodes of continued urinary hesitancy over night, though was able to void. Likely secondary to opiates and postoperative period.   - Hold off on catheterization at this point.   - Continue to monitor as it is improved over interval.   R posterior hip cellulitis with focal 7mm64muid collection in superficial subcutaneous fat.  S/p I&D 12/22/2013. Cx with no organism seen. - continue IV Vancomycin. Will continue antibiotics for 2 weeks post op. End date 01/05/2014.Can transition to doxycycline if discharged home. - continue to monitor  Diarrhea, abdominal pain, nausea/vomiting: C diff PCR negative. Likely 2/2 antibiotic associated diarrhea. Diarrhea has resolved. CT abd pelvis with no acute findings. LFTs wnl except  elevated Alk phos to 2 times the normal limit. GGT elevated indicating likely hepatic origin. RUQ US -[place]aild GB contraction, GB filled with sludge and small stones, murphy's sign negative, borderline GB wall thickness of 3.5mm,38m focal liver lesions. Resolved.  - continue to monitor  - Zofran 4mg I63m6hr prn, Reglan 5-10mg Q66mprn for breakthrough  - pain control as above  Acute on chronic anemia: Anemia of chronic disease. Has undergone two transfusions this hospitalization. Hgb 6.9 from 7.1.  - Transfuse 2u pRBC  - Continue to monitor CBC. Transfuse pRBC prn.  Suspected Diabetic ketoacidosis secondary to type 2 diabetes: Resolved.  Novolog 70/30 d/c due to patient with poor po and a few lows in the 60s.  CBG in the 200s yesterday and overnight.  - SSI  mod, CBG q AC/HS  - Continue Lantus 35u qHS; careful increase due to poor po and prior low CBGs.  AKI on CKD stage 2: Cr decreased to 2.24 from 2.78. (baseline around 1.6). FEurea and FENa suggestive of pre-renal etiology  - Continue to hold nephrotoxins  - NS @ 100cc/hr  - strict I&Os   Hypertension:    - increase amlodipine from 5 to 10 mg.  - Holding home lisinopril-hydrochlorothiazide in the setting of AKI  - Hold home lasix 45m daily as above  - IV labetalol prn for SBP >180  - continue amlodipine 525mdaily  FEN:   - Carb modified  - NS @ 100cc/hr   Hx Recurrent DVTs, DVT ppx: SCDs, warfarin  Dispo: Awaiting CIR vs SNF vs home   The patient does have a current PCP (ELucious GrovesDO) and [facility].  The patient does not have transportation limitations that hinder transportation to clinic appointments.  .Services Needed at time of discharge: Y = Yes, Blank = No PT: Rolling walker with 5" wheels, 3 in 1, Hospital bed, Wheelchair, Wheelchair cusion  OT:   RN:   Equipment:   Other:     LOS: 16 days   JeJacques EarthlyMD  12/25/2013, 9:32 AM

## 2013-12-25 NOTE — Progress Notes (Signed)
Rehab admissions - Currently rehab beds are full.  I will check back again in am for bed availability.  Call me for questions.  [id number]

## 2013-12-25 NOTE — PMR Pre-admission (Signed)
PMR Admission Coordinator Pre-Admission Assessment  Patient: [name] is an 38 y.o., male MRN: [id number] DOB: 09-Jan-1976 Height: 6\' 2"  (188 cm) Weight: (!) 174.726 kg (385 lb 3.2 oz)              Insurance Information Self pay - I have called financial counselor to see patient per patient's request.  Patient wished to apply for disability and medicaid potentially. 12/17 pt applied for disability in Spring and was denied in April. Did appeal. Suanne Marker , financial counselor 2 [phone] to make medicaid application appointment and f/u with his disability  Medicaid Application Date:        Case Manager:  pending Disability Application Date:        Case Worker:  Pending and was on appeal from the Spring 2015  Emergency Contact Information Contact Information    Name Relation Home Work Mobile   [place] Significant other   [phone]   Rolison,Betty Mother   [phone]   [place]asha[place], Viverito [phone]       Current Medical History  Patient Admitting Diagnosis:  R BKA  History of Present Illness: A 38 y.o. male with history of DM type 2 with diabetic foot ulcer, morbid obesity, HTN, CKD, on chronic coumadin for recurrent DVT/PE, medical non-compliance; who was admitted via MD office on 12/02 with fever chills, N/V and right foot pain. He was started on IV antibiotics for cellulitis right foot as well as IVF for AKI on CKD. MRI of right foot revealed skin ulcer on plantar surface 4th MTP with underlying abscess but no osteo or septic joint. Dr. Doran Durand was consulted for input and performed I and D at bedside with cultures. Cultures positive for MRSA. Patient developed recurrent fever with chills on 12/05 with progressive leucocytosis. Repeat MRI right foot on 12/06 revealed new abscess tracking medial plantar surface below 1st and 2nd MT with extensive edema due to cellulitis as well as myositis. He was taken to OR on 12/7 for I and D by Dr. Veverly Fells. Antibiotics narrowed to vancomycin.  Patient continued to have abdominal discomfort with N/V. Stool negative for C diff. CT abdomen/pelvis with atelectasis but no signs of infection. Patient with complaints of right hip pain and ultrasound with superficial abscess. Pain has improved but he continued to have persistent leucocytosis and ID consulted for input. Dr. Megan Salon recommended I and D of superficial right buttock abscess as well as continuing Vancomycin alone.   Patient continued to have poor wound healing with wet gangrene and failed attempts at limb salvage. He underwent R-BKA with I and D right buttock abscess by Dr. Doran Durand on 12/22/13. PT evaluation done yesterday and patient noted to have limitations with mobility as well as anxiety and fear with activity. Bio Tech consulted for [employer] device for limb protection.   Past Medical History  Past Medical History  Diagnosis Date  . DVT (deep venous thrombosis) 09/2002    patient reports additional DVTs in '06 & '11 (unconfirmed)  . Pulmonary embolism 09/2002    treated with 6 months of warfarin  . Hypertension   . Chest pain, neg MI, normal coronaries by cath 02/18/2013  . Hyperlipidemia 02/19/2013  . Acute venous embolism and thrombosis of deep vessels of proximal lower extremity 07/19/2011  . Type I diabetes mellitus dx'd 2001  . GERD (gastroesophageal reflux disease)   . Diabetic ulcer of right foot   . CKD (chronic kidney disease) stage 3, GFR 30-59 ml/min 02/19/2013  . Nephrotic syndrome  Family History  family history includes Diabetes in an other family member; Heart disease in an other family member; Obesity in an other family member.  Prior Rehab/Hospitalizations:  No previous rehab   Current Medications  Current facility-administered medications: 0.9 %  sodium chloride infusion, , Intravenous, Continuous, [name], MD, Last Rate: 100 mL/hr at 12/25/13 0449;  amLODipine (NORVASC) tablet 10 mg, 10 mg, Oral, Daily, Jacques Earthly, MD, 10 mg at 12/26/13 1013;   antiseptic oral rinse (CPC / CETYLPYRIDINIUM CHLORIDE 0.05%) solution 7 mL, 7 mL, Mouth Rinse, q12n4p, Madilyn Fireman, MD, 7 mL at 12/25/13 1258 carvedilol (COREG) tablet 3.125 mg, 3.125 mg, Oral, BID WC, Jacques Earthly, MD;  chlorhexidine (PERIDEX) 0.12 % solution 15 mL, 15 mL, Mouth Rinse, BID, Madilyn Fireman, MD, 15 mL at 12/25/13 0853;  dextrose 50 % solution 25 mL, 25 mL, Intravenous, PRN, Luan Moore, MD;  docusate sodium (COLACE) capsule 100 mg, 100 mg, Oral, BID, [name], MD, 100 mg at 12/26/13 1013 feeding supplement (GLUCERNA SHAKE) (GLUCERNA SHAKE) liquid 237 mL, 237 mL, Oral, BID BM, Jenifer A Williams, RD, 237 mL at 12/25/13 1504;  HYDROcodone-acetaminophen (NORCO) 10-325 MG per tablet 1-2 tablet, 1-2 tablet, Oral, Q4H, Francesca Oman, DO, 2 tablet at 12/26/13 0810;  HYDROmorphone (DILAUDID) injection 1 mg, 1 mg, Intravenous, Q3H PRN, Jacques Earthly, MD insulin aspart (novoLOG) injection 0-15 Units, 0-15 Units, Subcutaneous, TID WC, Jacques Earthly, MD, 2 Units at 12/25/13 1504;  insulin glargine (LANTUS) injection 35 Units, 35 Units, Subcutaneous, QHS, Jacques Earthly, MD, 35 Units at 12/25/13 2208;  labetalol (NORMODYNE,TRANDATE) injection 5 mg, 5 mg, Intravenous, Q2H PRN, Jacques Earthly, MD menthol-cetylpyridinium (CEPACOL) lozenge 3 mg, 1 lozenge, Oral, PRN, Albin Felling, MD, 3 mg at 12/18/13 2214;  metoCLOPramide (REGLAN) tablet 5-10 mg, 5-10 mg, Oral, Q8H PRN **OR** metoCLOPramide (REGLAN) injection 5-10 mg, 5-10 mg, Intravenous, Q8H PRN, [name], MD;  ondansetron [facility]) injection 4 mg, 4 mg, Intravenous, Q6H PRN, Jacques Earthly, MD, 4 mg at 12/22/13 0342 pravastatin (PRAVACHOL) tablet 20 mg, 20 mg, Oral, q1800, Francesca Oman, DO, 20 mg at 12/25/13 1809;  senna (SENOKOT) tablet 8.6 mg, 1 tablet, Oral, BID, [name], MD, 8.6 mg at 12/26/13 1013;  vancomycin (VANCOCIN) 1,250 mg in sodium chloride 0.9 % 250 mL IVPB, 1,250 mg, Intravenous, Q24H, Rolla Flatten, RPH, 1,250 mg at  12/25/13 [id number];  warfarin (COUMADIN) tablet 5 mg, 5 mg, Oral, ONCE-1800, Rande Lawman Rumbarger, [facility] Warfarin - Pharmacist Dosing Inpatient, , Does not apply, q1800, Aldine Contes, MD  Patients Current Diet: Diet Carb Modified  Precautions / Restrictions Precautions Precautions: Fall Precaution Comments: R post op shoe Restrictions Weight Bearing Restrictions: Yes RLE Weight Bearing: Non weight bearing Other Position/Activity Restrictions: new right BKA   Prior Activity Level Community (5-7x/wk): Went out daily.  Lost job back in April, not working since.  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  Also volunteers at [employer].   Home Assistive Devices / Equipment Home Assistive Devices/Equipment: CBG Meter Home Equipment: None  Prior Functional Level Prior Function Level of Independence: Independent Comments: Independent prior to admission on 12/09/13.  Current Functional Level Cognition  Overall Cognitive Status: Within Functional Limits for tasks assessed Orientation Level: Oriented X4    Extremity Assessment (includes Sensation/Coordination)   Lower Extremity Assessment: RLE deficits/detail RLE Deficits / Details: able to lift leg antigravity and extend leg in sitting, bends knee well, kept KI on when in bed    ADLs  Overall ADL's : Needs assistance/impaired  Eating/Feeding: Independent, Sitting Grooming: Oral care, Set up, Sitting Upper Body Bathing: Supervision/ safety, Sitting Lower Body Bathing: Maximal assistance, +2 for physical assistance, Sit to/from stand (+3 for safety) Upper Body Dressing : Supervision/safety, Bed level Lower Body Dressing: Total assistance, +2 for physical assistance, Sit to/from stand (+3 for safety) Toilet Transfer: Maximal assistance, +2 for physical assistance Toilet Transfer Details (indicate cue type and reason): pt performed sit>stand from bed using bariatric steady and transfered to recliner chair with +3 max (A) Toileting-  Clothing Manipulation and Hygiene: Minimal assistance, Sit to/from stand Tub/ Shower Transfer: Minimal assistance, Ambulation, Rolling walker Tub/Shower Transfer Details (indicate cue type and reason): simulated stepping over edge of tum with use of the RW for support and simulated tub wall. Functional mobility during ADLs: Minimal assistance, Rolling walker General ADL Comments: Pt repeatedly mentioned difficulty coping with BKA. Pt is limited by LLE weakness and pain which impair his independence.     Mobility  Overal bed mobility: Needs Assistance, +2 for physical assistance Bed Mobility: Sit to Supine Supine to sit: Mod assist Sit to supine: Supervision General bed mobility comments: Received sitting in chair upon PT arrival. Mod A to assist with BLEs to return to bed.    Transfers  Overall transfer level: Needs assistance Equipment used: None Transfers: Sit to/from Stand Sit to Stand: +2 physical assistance, Total assist, Max assist Stand pivot transfers: Min assist General transfer comment: Required multiple attempts to stand due to weakness/fatigue and pain in right stump. Stood from Psychologist, occupational. Total A of 2 with cues for technique and use of forward momentum to assist with elevating hips. Stood from [employer] Max A of 2 (higher surface) with cues for UE extension.    Ambulation / Gait / Stairs / Wheelchair Mobility  Ambulation/Gait Ambulation/Gait assistance: Min assist, Mod assist Ambulation Distance (Feet): 40 Feet Assistive device: Rolling walker (2 wheeled) Gait Pattern/deviations: Step-to pattern, Step-through pattern, Decreased stance time - right, Decreased stride length, Antalgic, Wide base of support Gait velocity: decreased General Gait Details: Pt requires max verbal cues at times for safety, as he tends to let RW get too far ahead of him and demonstrates impulsivity when getting back into room to recliner.  Poor safety awareness when attempting to negotiate around  obstacles in room.     Posture / Balance Dynamic Sitting Balance Sitting balance - Comments: Difficulty bringing trunk to upright seated position in recliner, Min A.    Special needs/care consideration BiPAP/CPAP No CPM No Continuous Drip IV KVO Dialysis No        Life Vest No Oxygen NO Special Bed No Trach Size No Wound Vac (area) NO       Skin Has a new R BKA with stump shrinker attached.  Also has a wound on buttocks which patient says was "lanced".  Dressing changes to right buttocks I and D 12/14                        Bowel mgmt: Had BM 12/22/13 per patient. Bladder mgmt: Voiding in urinal WDL Diabetic mgmt Type 1 on both oral and insulin medications at home    Previous Home Environment Living Arrangements: Alone  Lives With: Alone Available Help at Discharge: Family Type of Home: House Home Layout: One level Alternate Level Stairs-Rails: Left Alternate Level Stairs-Number of Steps: two level at mother's with 12 steps to bedrooms Home Access: Stairs to enter Entrance Stairs-Rails: Right Entrance Stairs-Number of Steps: 4 steps to  enter at girlfriend's  Bathroom Shower/Tub: Tub/shower unit, Architectural technologist: Standard (Mom's house may be handicapped height, girlfriends is standard.) Home Care Services: No  Discharge Living Setting Plans for Discharge Living Setting: Other (Comment) (Lives in a townhouse with 2 levels and 4 steps into home.)  May go to girlfriends home with one level home and 4 step entry. Type of Home at Discharge: House Discharge Home Layout: Two level, Bed/bath upstairs Alternate Level Stairs-Number of Steps: 16-20 steps Discharge Home Access: Stairs to enter Entrance Stairs-Number of Steps: 2 up to porch and 2 into townhouse. Does the patient have any problems obtaining your medications?: Yes (Describe) ("paying for them") Pt trying to decide between his Mom's home and his girlfriend's home due to accessibility issues. He seems unaware that he  will need w/c accessibility . He feels he will be mobilizing well with walker at d/c  Social/Family/Support Systems Patient Roles: Parent Contact Information: Margarita Mail - girlfriend [phone] Anticipated Caregiver: GF, sister, mom Anticipated Caregiver's Contact Information: see above Ability/Limitations of Caregiver: Mom works.  Sister works PT.  Nephews are 37 and 66 yo.  May decide to stay with his girlfriend. Caregiver Availability: Other (Comment) Discharge Plan Discussed with Primary Caregiver: Yes Is Caregiver In Agreement with Plan?: Yes Does Caregiver/Family have Issues with Lodging/Transportation while Pt is in Rehab?: No  Goals/Additional Needs Patient/Family Goal for Rehab: PT and OT Mod I goals at w/c level Expected length of stay: 7-10 days Cultural Considerations: None Dietary Needs: Carb mod med cal, thin liquids Equipment Needs: TBD Additional Information: I discussed with pt w/c accessibility of GF;s home and Mom;s home. Pt/Family Agrees to Admission and willing to participate: Yes Program Orientation Provided & Reviewed with Pt/Caregiver Including Roles  & Responsibilities: Yes  Decrease burden of Care through IP rehab admission: N/A  Possible need for SNF placement upon discharge: Yes, if patient does not make sufficient progress in rehab.  Patient Condition: This patient's condition remains as documented in the consult dated 12/26/13, in which the Rehabilitation Physician determined and documented that the patient's condition is appropriate for intensive rehabilitative care in an inpatient rehabilitation facility. Will admit to inpatient rehab today.  Preadmission Screen Completed By:  Cleatrice Burke, 12/26/2013 12:01 PM ______________________________________________________________________   Discussed status with Dr. Naaman Plummer on 12/27/2013 at  1201 and received telephone approval for admission today.  Admission Coordinator:  Cleatrice Burke, time [id number]  Date 12/26/2013.

## 2013-12-25 NOTE — Progress Notes (Signed)
ANTICOAGULATION + ANTIBIOTIC CONSULT NOTE - Follow Up Consult  Pharmacy Consult for Warfarin, vancomycin Indication: Hx recurrent DVTs, MRSA cellulitis  No Known Allergies  Patient Measurements: Height: 6\' 2"  (188 cm) Weight: (!) 383 lb 3.2 oz (173.818 kg) IBW/kg (Calculated) : 82.2  Vital Signs: Temp: 99.2 F (37.3 C) (12/17 0300) BP: 150/84 mmHg (12/17 0300) Pulse Rate: 106 (12/17 0300)  Labs:  Recent Labs  12/23/13 0424 12/23/13 1600 12/24/13 0447 12/25/13 0519 12/25/13 0800  HGB 8.0* 7.1* 7.1* 6.9* 6.5*  HCT 24.8* 21.5* 22.4* 21.3* 20.8*  PLT 561* 504* 479* 435*  --   LABPROT 17.5*  --  18.1* 16.4*  --   INR 1.42  --  1.48 1.31  --   CREATININE 2.89*  --  2.78* 2.24*  --     Estimated Creatinine Clearance: 75.9 mL/min (by C-G formula based on Cr of 2.24).   Assessment: 20 YOM on warfarin PTA for hx of recurrent DVTs. INR on admission was SUBtherapeutic and had a rapid rise when doses were initiated on 12/4. At this time, the cause of the rapid rise is unknown - LFTs are wnl. INR was reversed with Vit K 5 mg IV on 12/13 for R-BKA done on 12/14. Pharmacy was consulted to resume warfarin post-op on 12/14 however RN held the dose per discussion with the primary team. Discussed with the primary team today and clarified that warfarin to be resumed this 12/15. INR today is SUBtherapeutic (1.31, goal of 2-3). Hgb remains low but no bleeding noted. MD is transfusing PRN. Potentially related to procedures.  Pt also continues on D#17 of vancomycin for a R MRSA heel cellulitis and abscess. Pt is afebrile and WBC is 13.8. Scr is down to 2.24.   Goal of Therapy:  INR 2-3  Vanc trough 10-15   Plan:  1. Repeat Warfarin 2 mg x 1 dose at 1800 today - coumadin requirements unclear 2. Continue vanc 1250mg  IV Q24H  3. F/u AM INR, C&S, clinical status and trough at [facility], PharmD, BCPS Pager # [phone] 12/25/2013 10:44 AM

## 2013-12-25 NOTE — Progress Notes (Signed)
PT Cancellation Note  Patient Details Name: [name] MRN: [id number] DOB: 01-29-1975   Cancelled Treatment:    Pt with hemoglobin 6.9 today - getting transfusions. I tried to do exercises in bed and pt complaining of 10/10right residual limb pain with doctor changing dressings.  He asked me to wait until tomorrow to do PT as he thinks he will feel much better.   [name] 12/25/2013, 4:35 PM

## 2013-12-25 NOTE — Progress Notes (Signed)
Subjective: 3 Days Post-Op Procedure(s) (LRB): AMPUTATION BELOW KNEE (Right) I&D RIGHT BUTTOCK (Right) Patient reports pain as mild.  No n/v/f/c.  Has been OOB with PT.  Awaiting CIR consult.  Transfusing PRBCs now.  Objective: Vital signs in last 24 hours: Temp:  [98.6 F (37 C)-99.5 F (37.5 C)] 98.6 F (37 C) (12/17 1322) Pulse Rate:  [105-109] 105 (12/17 1322) Resp:  [18-24] 22 (12/17 1322) BP: (148-171)/(74-88) 171/88 mmHg (12/17 1322) SpO2:  [95 %-100 %] 100 % (12/17 1322) Weight:  [173.818 kg (383 lb 3.2 oz)] 173.818 kg (383 lb 3.2 oz) (12/17 0300)  Intake/Output from previous day: 12/16 0701 - 12/17 0700 In: 2043.3 [P.O.:960; I.V.:1083.3] Out: 2900 [Urine:2900] Intake/Output this shift: Total I/O In: 240 [P.O.:240] Out: -    Recent Labs  12/23/13 0424 12/23/13 1600 12/24/13 0447 12/25/13 0519 12/25/13 0800  HGB 8.0* 7.1* 7.1* 6.9* 6.5*    Recent Labs  12/24/13 0447 12/25/13 0519 12/25/13 0800  WBC 16.0* 13.8*  --   RBC 2.62* 2.44*  --   HCT 22.4* 21.3* 20.8*  PLT 479* 435*  --     Recent Labs  12/24/13 0447 12/25/13 0519  NA 137 138  K 4.5 4.6  CL 105 109  CO2 20 18*  BUN 47* 37*  CREATININE 2.78* 2.24*  GLUCOSE 150* 95  CALCIUM 8.3* 8.0*    Recent Labs  12/24/13 0447 12/25/13 0519  INR 1.48 1.31    PE:  R LE ace wrap and outer dressings removed.  Stump shrinker applied.  Skin appears healthy.  TTP at stump.  Assessment/Plan: 3 Days Post-Op Procedure(s) (LRB): AMPUTATION BELOW KNEE (Right) I&D RIGHT BUTTOCK (Right) Continue PT.  Agree with CIR consultation.  NWB on R LE.  Continue dressing changes at right buttock.  [name] 12/25/2013, 1:42 PM

## 2013-12-25 NOTE — Progress Notes (Signed)
CRITICAL VALUE ALERT  Critical value received:  Hgb 6.9   Date of notification:  12/25/2013  Time of notification:  0723  Critical value read back:Yes.    Nurse who received alert:  Precious Bard  MD notified (1st page):  [phone]  Time of first page:  0725  MD notified (2nd page):  Time of second page:  Responding MD:  Dr. Randell Patient  Time MD responded:  [phone]

## 2013-12-25 NOTE — Progress Notes (Signed)
2330 Patient refuses to wear stump shrinker.

## 2013-12-25 NOTE — H&P (Signed)
Physical Medicine and Rehabilitation Admission H&P    CC: R-BKA due to foot abscess with wet gangrene  HPI: [name] is a 38 y.o. male with history of DM type 2 with diabetic foot ulcer, morbid obesity, HTN, CKD, on chronic coumadin for recurrent DVT/PE, medical non-compliance; who was admitted via MD office on 12/02 with fever chills, N/V and right foot pain. He was started on IV antibiotics for cellulitis right foot as well as IVF for AKI on CKD. MRI of right foot revealed skin ulcer on plantar surface 4th MTP with underlying abscess but no osteo or septic joint. Dr. Doran Durand was consulted for input and performed I and D at bedside with cultures. Cultures positive for MRSA. Patient developed recurrent fever with chills on 12/05 with progressive leucocytosis. Repeat MRI right foot on 12/06 revealed new abscess tracking medial plantar surface below 1st and 2nd MT with extensive edema due to cellulitis as well as myositis. He was taken to OR on 12/7 for I and D by Dr. Veverly Fells. Antibiotics narrowed to vancomycin. Patient continued to have abdominal discomfort with N/V. Stool negative for C diff. CT abdomen/pelvis with atelectasis but no signs of infection. Patient with complaints of right hip pain and ultrasound with superficial abscess. Pain has improved but he continued to have persistent leucocytosis and ID consulted for input. Dr. Megan Salon recommended I and D of superficial right buttock abscess as well as continuing Vancomycin alone.   Patient continued to have poor wound healing with wet gangrene and failed attempts at limb salvage. He underwent R-BKA with I and D right buttock abscess by Dr. Doran Durand on 12/22/13.  He has had issues with depressed mood and difficulty dealing with amputation.  ID recommends 14 days Vanc post op to treat buttock wound.  Bio Tech consulted for [employer] device for limb protection.  CBC done to day revealing drop in Hgb to 6.5 and patient to be transfused with 2 units  PRBCs. Therapy ongoing with and patient limited by pain and weakness but is showing improvement in participation with activity. CIR was recommended by MD and rehab team and patient admitted today.   ROS    Past Medical History  Diagnosis Date  . DVT (deep venous thrombosis) 09/2002    patient reports additional DVTs in '06 & '11 (unconfirmed)  . Pulmonary embolism 09/2002    treated with 6 months of warfarin  . Hypertension   . Chest pain, neg MI, normal coronaries by cath 02/18/2013  . Hyperlipidemia 02/19/2013  . Acute venous embolism and thrombosis of deep vessels of proximal lower extremity 07/19/2011  . Type I diabetes mellitus dx'd 2001  . GERD (gastroesophageal reflux disease)   . Diabetic ulcer of right foot   . CKD (chronic kidney disease) stage 3, GFR 30-59 ml/min 02/19/2013  . Nephrotic syndrome     Past Surgical History  Procedure Laterality Date  . Incision and drainage abscess  2007; 2015    "back"  . Cardiac catheterization  02/18/2013    normal coronaries  . I&d extremity Right 12/14/2013    Procedure: IRRIGATION AND DEBRIDEMENT RIGHT FOOT;  Surgeon: [name], MD;  Location: [place];  Service: Orthopedics;  Laterality: Right;  . Left heart catheterization with coronary angiogram N/A 02/18/2013    Procedure: LEFT HEART CATHETERIZATION WITH CORONARY ANGIOGRAM;  Surgeon: [name], MD;  Location: [facility] CATH LAB;  Service: Cardiovascular;  Laterality: N/A;  . Amputation Right 12/22/2013    Procedure: AMPUTATION BELOW KNEE;  Surgeon: [name], MD;  Location: [place];  Service: Orthopedics;  Laterality: Right;  . Incision and drainage of wound Right 12/22/2013    Procedure: I&D RIGHT BUTTOCK;  Surgeon: [name], MD;  Location: [place];  Service: Orthopedics;  Laterality: Right;    Family History  Problem Relation Age of Onset  . Heart disease    . Diabetes    . Obesity      Social History:  Single. Unemployed for past few months. Lives with mother. Independent  without AD. He reports that he has never smoked. He has never used smokeless tobacco. He reports that he drinks alcohol. He reports that he does not use illicit drugs.    Allergies: No Known Allergies    Medications Prior to Admission  Medication Sig Dispense Refill  . furosemide (LASIX) 40 MG tablet Take 1 tablet (40 mg total) by mouth daily. 30 tablet   . insulin NPH-regular Human (NOVOLIN 70/30) (70-30) 100 UNIT/ML injection Inject 35-50 Units into the skin 2 (two) times daily with a meal. 50 units in the morning and 45 units in the evening (Patient taking differently: Inject 45-50 Units into the skin 2 (two) times daily with a meal. 50 units in the morning and 45 units in the evening) 10 mL 11  . lisinopril-hydrochlorothiazide (PRINZIDE,ZESTORETIC) 20-25 MG per tablet Take 1 tablet by mouth daily. 30 tablet 6  . lovastatin (MEVACOR) 20 MG tablet Take 1 tablet (20 mg total) by mouth daily. 30 tablet 11  . metFORMIN (GLUCOPHAGE) 500 MG tablet Take 2 tablets (1,000 mg total) by mouth 2 (two) times daily with a meal. 120 tablet 6  . ranitidine (ZANTAC) 300 MG tablet Take 1 tablet (300 mg total) by mouth at bedtime. 30 tablet 3  . warfarin (COUMADIN) 5 MG tablet Take 1.5 tablets (7.5 mg) all days of week EXCEPT on Mondays and Thursdays take 2 tablets. 50 tablet 1    Home: Home Living Family/patient expects to be discharged to:: Inpatient rehab Living Arrangements: Spouse/significant other, Parent Available Help at Discharge: Family Type of Home: House (townhouse at mothers) Home Access: Stairs to enter Technical brewer of Steps: 4 steps to enter at girlfriend's  Entrance Stairs-Rails: Right Home Layout: One level Alternate Level Stairs-Number of Steps: two level at mother's with 12 steps to bedrooms Alternate Level Stairs-Rails: Left Home Equipment: None   Functional History: Prior Function Level of Independence: Independent Comments: Independent prior to admission on  12/09/13.  Functional Status:  Mobility: Bed Mobility Overal bed mobility: Needs Assistance, +2 for physical assistance Bed Mobility: Sit to Supine Supine to sit: Mod assist Sit to supine: Supervision General bed mobility comments: Received sitting in chair upon PT arrival. Mod A to assist with BLEs to return to bed. Transfers Overall transfer level: Needs assistance Equipment used: None Transfers: Sit to/from Stand Sit to Stand: +2 physical assistance, Total assist, Max assist Stand pivot transfers: Min assist General transfer comment: Required multiple attempts to stand due to weakness/fatigue and pain in right stump. Stood from Psychologist, occupational. Total A of 2 with cues for technique and use of forward momentum to assist with elevating hips. Stood from [employer] Max A of 2 (higher surface) with cues for UE extension. Ambulation/Gait Ambulation/Gait assistance: Min assist, Mod assist Ambulation Distance (Feet): 40 Feet Assistive device: Rolling walker (2 wheeled) Gait Pattern/deviations: Step-to pattern, Step-through pattern, Decreased stance time - right, Decreased stride length, Antalgic, Wide base of support Gait velocity: decreased General Gait Details: Pt requires max  verbal cues at times for safety, as he tends to let RW get too far ahead of him and demonstrates impulsivity when getting back into room to recliner.  Poor safety awareness when attempting to negotiate around obstacles in room.     ADL: ADL Overall ADL's : Needs assistance/impaired Eating/Feeding: Independent, Sitting Grooming: Oral care, Set up, Sitting Upper Body Bathing: Supervision/ safety, Sitting Lower Body Bathing: Maximal assistance, +2 for physical assistance, Sit to/from stand (+3 for safety) Upper Body Dressing : Supervision/safety, Bed level Lower Body Dressing: Total assistance, +2 for physical assistance, Sit to/from stand (+3 for safety) Toilet Transfer: Maximal assistance, +2 for physical  assistance Toilet Transfer Details (indicate cue type and reason): pt performed sit>stand from bed using bariatric steady and transfered to recliner chair with +3 max (A) Toileting- Clothing Manipulation and Hygiene: Minimal assistance, Sit to/from stand Tub/ Shower Transfer: Minimal assistance, Ambulation, Rolling walker Tub/Shower Transfer Details (indicate cue type and reason): simulated stepping over edge of tum with use of the RW for support and simulated tub wall. Functional mobility during ADLs: Minimal assistance, Rolling walker General ADL Comments: Pt repeatedly mentioned difficulty coping with BKA. Pt is limited by LLE weakness and pain which impair his independence.   Cognition: Cognition Overall Cognitive Status: Within Functional Limits for tasks assessed Orientation Level: Oriented X4 Cognition Arousal/Alertness: Awake/alert Behavior During Therapy: WFL for tasks assessed/performed Overall Cognitive Status: Within Functional Limits for tasks assessed  Physical Exam: Blood pressure 154/88, pulse 103, temperature 99.2 F (37.3 C), temperature source Oral, resp. rate 18, height _0  (1.88 m), weight 174.726 kg (385 lb 3.2 oz), SpO2 100 %. Physical Exam  Constitutional: He appears well-developed and well-nourished.  HENT:  Head: Normocephalic and atraumatic.  Eyes: Conjunctivae and EOM are normal. Pupils are equal, round, and reactive to light.  Neck: No tracheal deviation present. No thyromegaly present.  Cardiovascular: Normal rate and normal heart sounds.  Exam reveals no friction rub.   No murmur heard. Respiratory: No respiratory distress. He has no wheezes.  GI: He exhibits no distension. There is no tenderness.  Musculoskeletal: He exhibits edema (1+ LLE).  Right leg dressed and in hard knee extension sleeve  Neurological: No cranial nerve deficit. Coordination normal.  Psychiatric: He has a normal mood and affect. His behavior is normal.    Results for orders  placed or performed during the hospital encounter of 12/09/13 (from the past 48 hour(s))  Glucose, capillary     Status: Abnormal   Collection Time: 12/24/13 11:47 AM  Result Value Ref Range   Glucose-Capillary 153 (H) 70 - 99 mg/dL  Glucose, capillary     Status: Abnormal   Collection Time: 12/24/13  5:35 PM  Result Value Ref Range   Glucose-Capillary 159 (H) 70 - 99 mg/dL  Glucose, capillary     Status: Abnormal   Collection Time: 12/24/13  9:10 PM  Result Value Ref Range   Glucose-Capillary 147 (H) 70 - 99 mg/dL  Protime-INR     Status: Abnormal   Collection Time: 12/25/13  5:19 AM  Result Value Ref Range   Prothrombin Time 16.4 (H) 11.6 - 15.2 seconds   INR 1.31 0.00 - 1.49  CBC with Differential     Status: Abnormal   Collection Time: 12/25/13  5:19 AM  Result Value Ref Range   WBC 13.8 (H) 4.0 - 10.5 K/uL   RBC 2.44 (L) 4.22 - 5.81 MIL/uL   Hemoglobin 6.9 (LL) 13.0 - 17.0 g/dL    Comment:  REPEATED TO VERIFY CRITICAL RESULT CALLED TO, READ BACK BY AND VERIFIED WITH: CROSS,T RN @ [phone] 12/25/13 LEONARD,A    HCT 21.3 (L) 39.0 - 52.0 %   MCV 87.3 78.0 - 100.0 fL   MCH 28.3 26.0 - 34.0 pg   MCHC 32.4 30.0 - 36.0 g/dL   RDW 16.3 (H) 11.5 - 15.5 %   Platelets 435 (H) 150 - 400 K/uL   Neutrophils Relative % 79 (H) 43 - 77 %   Lymphocytes Relative 11 (L) 12 - 46 %   Monocytes Relative 8 3 - 12 %   Eosinophils Relative 2 0 - 5 %   Basophils Relative 0 0 - 1 %   Neutro Abs 10.9 (H) 1.7 - 7.7 K/uL   Lymphs Abs 1.5 0.7 - 4.0 K/uL   Monocytes Absolute 1.1 (H) 0.1 - 1.0 K/uL   Eosinophils Absolute 0.3 0.0 - 0.7 K/uL   Basophils Absolute 0.0 0.0 - 0.1 K/uL   RBC Morphology POLYCHROMASIA PRESENT    WBC Morphology ATYPICAL LYMPHOCYTES   Basic metabolic panel     Status: Abnormal   Collection Time: 12/25/13  5:19 AM  Result Value Ref Range   Sodium 138 137 - 147 mEq/L   Potassium 4.6 3.7 - 5.3 mEq/L   Chloride 109 96 - 112 mEq/L   CO2 18 (L) 19 - 32 mEq/L   Glucose, Bld 95 70 -  99 mg/dL   BUN 37 (H) 6 - 23 mg/dL   Creatinine, Ser 2.24 (H) 0.50 - 1.35 mg/dL   Calcium 8.0 (L) 8.4 - 10.5 mg/dL   GFR calc non Af Amer 36 (L) >90 mL/min   GFR calc Af Amer 41 (L) >90 mL/min    Comment: (NOTE) The eGFR has been calculated using the CKD EPI equation. This calculation has not been validated in all clinical situations. eGFR's persistently <90 mL/min signify possible Chronic Kidney Disease.    Anion gap 11 5 - 15  Glucose, capillary     Status: None   Collection Time: 12/25/13  7:32 AM  Result Value Ref Range   Glucose-Capillary 93 70 - 99 mg/dL  Hemoglobin and hematocrit, blood     Status: Abnormal   Collection Time: 12/25/13  8:00 AM  Result Value Ref Range   Hemoglobin 6.5 (LL) 13.0 - 17.0 g/dL    Comment: CRITICAL VALUE NOTED.  VALUE IS CONSISTENT WITH PREVIOUSLY REPORTED AND CALLED VALUE.   HCT 20.8 (L) 39.0 - 52.0 %  Type and screen     Status: None   Collection Time: 12/25/13  8:45 AM  Result Value Ref Range   ABO/RH(D) B POS    Antibody Screen NEG    Sample Expiration 12/28/2013    Unit Number [id number]    Blood Component Type RED CELLS,LR    Unit division 00    Status of Unit ISSUED,FINAL    Transfusion Status OK TO TRANSFUSE    Crossmatch Result Compatible    Unit Number [id number]    Blood Component Type RED CELLS,LR    Unit division 00    Status of Unit ISSUED,FINAL    Transfusion Status OK TO TRANSFUSE    Crossmatch Result Compatible   Prepare RBC     Status: None   Collection Time: 12/25/13 10:59 AM  Result Value Ref Range   Order Confirmation ORDER PROCESSED BY BLOOD BANK   Glucose, capillary     Status: Abnormal   Collection Time: 12/25/13  2:49 PM  Result  Value Ref Range   Glucose-Capillary 132 (H) 70 - 99 mg/dL  Glucose, capillary     Status: Abnormal   Collection Time: 12/25/13  5:53 PM  Result Value Ref Range   Glucose-Capillary 113 (H) 70 - 99 mg/dL  Glucose, capillary     Status: Abnormal   Collection Time: 12/25/13   9:47 PM  Result Value Ref Range   Glucose-Capillary 138 (H) 70 - 99 mg/dL   Comment 1 Notify RN    Comment 2 Documented in Chart   CBC     Status: Abnormal   Collection Time: 12/26/13  1:03 AM  Result Value Ref Range   WBC 13.1 (H) 4.0 - 10.5 K/uL   RBC 3.03 (L) 4.22 - 5.81 MIL/uL   Hemoglobin 8.4 (L) 13.0 - 17.0 g/dL    Comment: POST TRANSFUSION SPECIMEN   HCT 26.3 (L) 39.0 - 52.0 %   MCV 86.8 78.0 - 100.0 fL   MCH 27.7 26.0 - 34.0 pg   MCHC 31.9 30.0 - 36.0 g/dL   RDW 15.9 (H) 11.5 - 15.5 %   Platelets 387 150 - 400 K/uL  Protime-INR     Status: Abnormal   Collection Time: 12/26/13  4:39 AM  Result Value Ref Range   Prothrombin Time 17.0 (H) 11.6 - 15.2 seconds   INR 1.37 0.00 - 1.49  CBC with Differential     Status: Abnormal   Collection Time: 12/26/13  4:39 AM  Result Value Ref Range   WBC 12.5 (H) 4.0 - 10.5 K/uL   RBC 3.00 (L) 4.22 - 5.81 MIL/uL   Hemoglobin 8.1 (L) 13.0 - 17.0 g/dL   HCT 25.6 (L) 39.0 - 52.0 %   MCV 85.3 78.0 - 100.0 fL   MCH 27.0 26.0 - 34.0 pg   MCHC 31.6 30.0 - 36.0 g/dL   RDW 15.9 (H) 11.5 - 15.5 %   Platelets 406 (H) 150 - 400 K/uL   Neutrophils Relative % 79 (H) 43 - 77 %   Neutro Abs 9.8 (H) 1.7 - 7.7 K/uL   Lymphocytes Relative 11 (L) 12 - 46 %   Lymphs Abs 1.4 0.7 - 4.0 K/uL   Monocytes Relative 8 3 - 12 %   Monocytes Absolute 1.0 0.1 - 1.0 K/uL   Eosinophils Relative 2 0 - 5 %   Eosinophils Absolute 0.2 0.0 - 0.7 K/uL   Basophils Relative 0 0 - 1 %   Basophils Absolute 0.0 0.0 - 0.1 K/uL  Basic metabolic panel     Status: Abnormal   Collection Time: 12/26/13  4:39 AM  Result Value Ref Range   Sodium 139 137 - 147 mEq/L   Potassium 4.9 3.7 - 5.3 mEq/L   Chloride 109 96 - 112 mEq/L   CO2 20 19 - 32 mEq/L   Glucose, Bld 117 (H) 70 - 99 mg/dL   BUN 32 (H) 6 - 23 mg/dL   Creatinine, Ser 2.20 (H) 0.50 - 1.35 mg/dL   Calcium 8.2 (L) 8.4 - 10.5 mg/dL   GFR calc non Af Amer 36 (L) >90 mL/min   GFR calc Af Amer 42 (L) >90 mL/min     Comment: (NOTE) The eGFR has been calculated using the CKD EPI equation. This calculation has not been validated in all clinical situations. eGFR's persistently <90 mL/min signify possible Chronic Kidney Disease.    Anion gap 10 5 - 15  Glucose, capillary     Status: None   Collection Time: 12/26/13  7:56 AM  Result Value Ref Range   Glucose-Capillary 99 70 - 99 mg/dL  Glucose, capillary     Status: None   Collection Time: 12/26/13 10:36 AM  Result Value Ref Range   Glucose-Capillary 92 70 - 99 mg/dL   Comment 1 Notify RN    No results found.     Medical Problem List and Plan: 1. Functional deficits secondary to L-BKA 2.  DVT Prophylaxis/Anticoagulation: Pharmaceutical: Lovenox 3. Pain Management: Premedicate prior to therapy sessions. Continues to require IV dilaudid in addition to hydrocodone --will escalate to oxycodone prn and d/c dilaudid.  4. Mood: team to provide ego support. Will add lexapro for mood stabilization. LCSW to follow for evaluation and support. Will need psych follow up also.  5. Neuropsych: This patient is capable of making decisions on his own behalf. 6. Skin/Wound Care: Daily dressing changes to left buttock wound. Routine pressure relief measures. Monitor wound daily for healing. Maintain adequate nutrition. .  7. Fluids/Electrolytes/Nutrition: Monitor I/O.  Add protein supplements to help with low protein stores.  8. Reactive leucocytosis: Resolving. Recheck in am. Monitor wound and temperature curve daily.  9. DM type 2: Po intake remains poor.  On Lantus to prevent hypoglycemic episodes. Will need to be changed to 70/30 prior to d/c home due to financial constraints. Will monitor BS with ac/hs checks and titrate insulin as indicated. Reinforce importance of medication compliance.  10. CKD:  Monitor renal status closely as on IV Vancomycin. Avoid other nephrotoxic drugs. Will recheck lytes in am.  11. HTN: Monitor BP tid on norvasc. Off prinizide and lasix  due to AKI on CKD. Baseline Cr- 1.78 but is stabilizing to 2.3 range.  12. Acute on chronic anemia: Low iron stores with high ferritin levels. Likely poor absorption.   Transfused with 2 units on 12/17.  Recheck labs on Monday.  13. Left buttock wound: S/p I & D 12/14--antibiotic Day # 4/14  14. Resting tachycardia: HR in 110 range. Multifactorial due to anemia as well as deconditioning. Will monitor for symptoms with activity. May need low dose BB.    Post Admission Physician Evaluation: 1. Functional deficits secondary  to left BKA. 2. Patient is admitted to receive collaborative, interdisciplinary care between the physiatrist, rehab nursing staff, and therapy team. 3. Patient's level of medical complexity and substantial therapy needs in context of that medical necessity cannot be provided at a lesser intensity of care such as a SNF. 4. Patient has experienced substantial functional loss from his/her baseline which was documented above under the "Functional History" and "Functional Status" headings.  Judging by the patient's diagnosis, physical exam, and functional history, the patient has potential for functional progress which will result in measurable gains while on inpatient rehab.  These gains will be of substantial and practical use upon discharge  in facilitating mobility and self-care at the household level. 5. Physiatrist will provide 24 hour management of medical needs as well as oversight of the therapy plan/treatment and provide guidance as appropriate regarding the interaction of the two. 6. 24 hour rehab nursing will assist with bladder management, bowel management, safety, skin/wound care, disease management, medication administration, pain management and patient education  and help integrate therapy concepts, techniques,education, etc. 7. PT will assess and treat for/with: Lower extremity strength, range of motion, stamina, balance, functional mobility, safety, adaptive techniques and  equipment, pain mgt, pre-prosthetic ed.   Goals are: mod I. 8. OT will assess and treat for/with: ADL's, functional mobility, safety, upper extremity  strength, adaptive techniques and equipment, pain mgt, egosupport.   Goals are: mod I. Therapy may  proceed with showering this patient if stump is covered and water-proofed. 9. SLP will assess and treat for/with: n/a.  Goals are: n/a. 10. Case Management and Social Worker will assess and treat for psychological issues and discharge planning. 11. Team conference will be held weekly to assess progress toward goals and to determine barriers to discharge. 12. Patient will receive at least 3 hours of therapy per day at least 5 days per week. 13. ELOS: 7-9 days       14. Prognosis:  excellent     Meredith Staggers, MD, [facility] 12/26/2013   12/26/2013

## 2013-12-25 NOTE — Progress Notes (Signed)
   12/25/13 1000  Clinical Encounter Type  Visited With Patient;Health care provider  Visit Type Follow-up  Spiritual Encounters  Spiritual Needs Emotional  Stress Factors  Patient Stress Factors Health changes;Major life changes   Chaplain followed up with patient this morning. Patient says he is coming to accept his amputation. Patient said the only direction he can go today is forward. Patient also expressed that when people ask him questions about his amputation that he will feel more comfortable answering them and even not answering them if he feels the need not to. Patient also expressed increased confidence in his family's support. Chaplain plans to follow up with patient later today. Gar Ponto, Chaplain  10:11 AM

## 2013-12-26 ENCOUNTER — Inpatient Hospital Stay (HOSPITAL_COMMUNITY)
Admission: RE | Admit: 2013-12-26 | Discharge: 2014-01-20 | DRG: 561 | Disposition: A | Discharge status: Home / Self Care | Payer: Medicaid Other | Source: Intra-hospital | Attending: Physical Medicine & Rehabilitation | Admitting: Physical Medicine & Rehabilitation

## 2013-12-26 DIAGNOSIS — Z7901 Long term (current) use of anticoagulants: Secondary | ICD-10-CM

## 2013-12-26 DIAGNOSIS — M79604 Pain in right leg: Secondary | ICD-10-CM | POA: Diagnosis present

## 2013-12-26 DIAGNOSIS — I129 Hypertensive chronic kidney disease with stage 1 through stage 4 chronic kidney disease, or unspecified chronic kidney disease: Secondary | ICD-10-CM | POA: Diagnosis present

## 2013-12-26 DIAGNOSIS — R05 Cough: Secondary | ICD-10-CM

## 2013-12-26 DIAGNOSIS — L03119 Cellulitis of unspecified part of limb: Secondary | ICD-10-CM

## 2013-12-26 DIAGNOSIS — Z794 Long term (current) use of insulin: Secondary | ICD-10-CM | POA: Diagnosis not present

## 2013-12-26 DIAGNOSIS — K219 Gastro-esophageal reflux disease without esophagitis: Secondary | ICD-10-CM | POA: Diagnosis present

## 2013-12-26 DIAGNOSIS — K5901 Slow transit constipation: Secondary | ICD-10-CM | POA: Diagnosis not present

## 2013-12-26 DIAGNOSIS — Z79899 Other long term (current) drug therapy: Secondary | ICD-10-CM | POA: Diagnosis not present

## 2013-12-26 DIAGNOSIS — D649 Anemia, unspecified: Secondary | ICD-10-CM | POA: Diagnosis present

## 2013-12-26 DIAGNOSIS — K5900 Constipation, unspecified: Secondary | ICD-10-CM

## 2013-12-26 DIAGNOSIS — E109 Type 1 diabetes mellitus without complications: Secondary | ICD-10-CM | POA: Diagnosis present

## 2013-12-26 DIAGNOSIS — L02419 Cutaneous abscess of limb, unspecified: Secondary | ICD-10-CM

## 2013-12-26 DIAGNOSIS — Z89511 Acquired absence of right leg below knee: Secondary | ICD-10-CM

## 2013-12-26 DIAGNOSIS — E119 Type 2 diabetes mellitus without complications: Secondary | ICD-10-CM | POA: Diagnosis present

## 2013-12-26 DIAGNOSIS — E11621 Type 2 diabetes mellitus with foot ulcer: Secondary | ICD-10-CM | POA: Insufficient documentation

## 2013-12-26 DIAGNOSIS — Z4781 Encounter for orthopedic aftercare following surgical amputation: Secondary | ICD-10-CM | POA: Diagnosis not present

## 2013-12-26 DIAGNOSIS — F4321 Adjustment disorder with depressed mood: Secondary | ICD-10-CM | POA: Diagnosis present

## 2013-12-26 DIAGNOSIS — R0602 Shortness of breath: Secondary | ICD-10-CM

## 2013-12-26 DIAGNOSIS — I10 Essential (primary) hypertension: Secondary | ICD-10-CM | POA: Diagnosis present

## 2013-12-26 DIAGNOSIS — R059 Cough, unspecified: Secondary | ICD-10-CM

## 2013-12-26 DIAGNOSIS — K5909 Other constipation: Secondary | ICD-10-CM | POA: Diagnosis not present

## 2013-12-26 DIAGNOSIS — L97519 Non-pressure chronic ulcer of other part of right foot with unspecified severity: Secondary | ICD-10-CM

## 2013-12-26 DIAGNOSIS — N182 Chronic kidney disease, stage 2 (mild): Secondary | ICD-10-CM

## 2013-12-26 DIAGNOSIS — S78111A Complete traumatic amputation at level between right hip and knee, initial encounter: Secondary | ICD-10-CM

## 2013-12-26 DIAGNOSIS — E785 Hyperlipidemia, unspecified: Secondary | ICD-10-CM | POA: Diagnosis present

## 2013-12-26 DIAGNOSIS — E1059 Type 1 diabetes mellitus with other circulatory complications: Secondary | ICD-10-CM

## 2013-12-26 LAB — BASIC METABOLIC PANEL
Anion gap: 10 (ref 5–15)
BUN: 32 mg/dL — AB (ref 6–23)
CHLORIDE: 109 meq/L (ref 96–112)
CO2: 20 meq/L (ref 19–32)
CREATININE: 2.2 mg/dL — AB (ref 0.50–1.35)
Calcium: 8.2 mg/dL — ABNORMAL LOW (ref 8.4–10.5)
GFR calc Af Amer: 42 mL/min — ABNORMAL LOW (ref >90)
GFR calc non Af Amer: 36 mL/min — ABNORMAL LOW (ref >90)
GLUCOSE: 117 mg/dL — AB (ref 70–99)
Potassium: 4.9 mEq/L (ref 3.7–5.3)
Sodium: 139 mEq/L (ref 137–147)

## 2013-12-26 LAB — GLUCOSE, CAPILLARY
GLUCOSE-CAPILLARY: 99 mg/dL (ref 70–99)
GLUCOSE-CAPILLARY: 115 mg/dL — AB (ref 70–99)
Glucose-Capillary: 92 mg/dL (ref 70–99)
Glucose-Capillary: 83 mg/dL (ref 70–99)

## 2013-12-26 LAB — CULTURE, ROUTINE-ABSCESS

## 2013-12-26 LAB — CBC
HCT: 26.3 % — ABNORMAL LOW (ref 39.0–52.0)
Hemoglobin: 8.4 g/dL — ABNORMAL LOW (ref 13.0–17.0)
MCH: 27.7 pg (ref 26.0–34.0)
MCHC: 31.9 g/dL (ref 30.0–36.0)
MCV: 86.8 fL (ref 78.0–100.0)
PLATELETS: 387 10*3/uL (ref 150–400)
RBC: 3.03 MIL/uL — AB (ref 4.22–5.81)
RDW: 15.9 % — ABNORMAL HIGH (ref 11.5–15.5)
WBC: 13.1 10*3/uL — AB (ref 4.0–10.5)

## 2013-12-26 LAB — CBC WITH DIFFERENTIAL/PLATELET
BASOS PCT: 0 % (ref 0–1)
Basophils Absolute: 0 10*3/uL (ref 0.0–0.1)
Eosinophils Absolute: 0.2 10*3/uL (ref 0.0–0.7)
Eosinophils Relative: 2 % (ref 0–5)
HCT: 25.6 % — ABNORMAL LOW (ref 39.0–52.0)
Hemoglobin: 8.1 g/dL — ABNORMAL LOW (ref 13.0–17.0)
LYMPHS PCT: 11 % — AB (ref 12–46)
Lymphs Abs: 1.4 10*3/uL (ref 0.7–4.0)
MCH: 27 pg (ref 26.0–34.0)
MCHC: 31.6 g/dL (ref 30.0–36.0)
MCV: 85.3 fL (ref 78.0–100.0)
MONO ABS: 1 10*3/uL (ref 0.1–1.0)
Monocytes Relative: 8 % (ref 3–12)
Neutro Abs: 9.8 10*3/uL — ABNORMAL HIGH (ref 1.7–7.7)
Neutrophils Relative %: 79 % — ABNORMAL HIGH (ref 43–77)
PLATELETS: 406 10*3/uL — AB (ref 150–400)
RBC: 3 MIL/uL — ABNORMAL LOW (ref 4.22–5.81)
RDW: 15.9 % — ABNORMAL HIGH (ref 11.5–15.5)
WBC: 12.5 10*3/uL — AB (ref 4.0–10.5)

## 2013-12-26 LAB — TYPE AND SCREEN
ABO/RH(D): B POS
Antibody Screen: NEGATIVE
UNIT DIVISION: 0
Unit division: 0

## 2013-12-26 LAB — VANCOMYCIN, TROUGH: VANCOMYCIN TR: 23.1 ug/mL — AB (ref 10.0–20.0)

## 2013-12-26 LAB — PROTIME-INR
INR: 1.37 (ref 0.00–1.49)
PROTHROMBIN TIME: 17 s — AB (ref 11.6–15.2)

## 2013-12-26 MED ORDER — INSULIN ASPART 100 UNIT/ML ~~LOC~~ SOLN
0.0000 [IU] | Freq: Three times a day (TID) | SUBCUTANEOUS | Status: DC
  Administered 2013-12-27 – 2014-01-01 (×3): 2 [IU] via SUBCUTANEOUS
  Administered 2014-01-03: 3 [IU] via SUBCUTANEOUS
  Administered 2014-01-03 – 2014-01-05 (×3): 2 [IU] via SUBCUTANEOUS
  Administered 2014-01-09 – 2014-01-10 (×2): 3 [IU] via SUBCUTANEOUS
  Administered 2014-01-10 – 2014-01-11 (×2): 2 [IU] via SUBCUTANEOUS
  Administered 2014-01-11: 3 [IU] via SUBCUTANEOUS
  Administered 2014-01-11: 2 [IU] via SUBCUTANEOUS
  Administered 2014-01-12 (×3): 3 [IU] via SUBCUTANEOUS
  Administered 2014-01-13: 2 [IU] via SUBCUTANEOUS
  Administered 2014-01-13: 8 [IU] via SUBCUTANEOUS
  Administered 2014-01-14: 2 [IU] via SUBCUTANEOUS
  Administered 2014-01-14: 5 [IU] via SUBCUTANEOUS
  Administered 2014-01-14: 2 [IU] via SUBCUTANEOUS
  Administered 2014-01-15: 3 [IU] via SUBCUTANEOUS
  Administered 2014-01-15: 2 [IU] via SUBCUTANEOUS
  Administered 2014-01-15 – 2014-01-16 (×2): 3 [IU] via SUBCUTANEOUS
  Administered 2014-01-16 – 2014-01-17 (×2): 2 [IU] via SUBCUTANEOUS
  Administered 2014-01-17: 3 [IU] via SUBCUTANEOUS
  Administered 2014-01-18 – 2014-01-19 (×4): 2 [IU] via SUBCUTANEOUS
  Administered 2014-01-20: 3 [IU] via SUBCUTANEOUS

## 2013-12-26 MED ORDER — TRAZODONE HCL 50 MG PO TABS
25.0000 mg | ORAL_TABLET | Freq: Every evening | ORAL | Status: DC | PRN
  Administered 2013-12-30 – 2014-01-19 (×3): 50 mg via ORAL
  Filled 2013-12-26 (×3): qty 1

## 2013-12-26 MED ORDER — PROCHLORPERAZINE MALEATE 5 MG PO TABS
5.0000 mg | ORAL_TABLET | Freq: Four times a day (QID) | ORAL | Status: DC | PRN
  Filled 2013-12-26: qty 2

## 2013-12-26 MED ORDER — WARFARIN SODIUM 5 MG PO TABS
5.0000 mg | ORAL_TABLET | Freq: Once | ORAL | Status: AC
  Administered 2013-12-26: 5 mg via ORAL
  Filled 2013-12-26: qty 1

## 2013-12-26 MED ORDER — GUAIFENESIN-DM 100-10 MG/5ML PO SYRP
5.0000 mL | ORAL_SOLUTION | Freq: Four times a day (QID) | ORAL | Status: DC | PRN
  Administered 2014-01-16: 5 mL via ORAL
  Administered 2014-01-17 – 2014-01-19 (×5): 10 mL via ORAL
  Filled 2013-12-26 (×5): qty 10

## 2013-12-26 MED ORDER — BISACODYL 10 MG RE SUPP
10.0000 mg | Freq: Every day | RECTAL | Status: DC | PRN
Start: 2013-12-26 — End: 2014-01-20

## 2013-12-26 MED ORDER — ALUM & MAG HYDROXIDE-SIMETH 200-200-20 MG/5ML PO SUSP
30.0000 mL | ORAL | Status: DC | PRN
  Filled 2013-12-26: qty 30

## 2013-12-26 MED ORDER — WARFARIN SODIUM 5 MG PO TABS
5.0000 mg | ORAL_TABLET | Freq: Once | ORAL | Status: DC
  Filled 2013-12-26: qty 1

## 2013-12-26 MED ORDER — AMLODIPINE BESYLATE 10 MG PO TABS
10.0000 mg | ORAL_TABLET | Freq: Every day | ORAL | Status: DC
  Administered 2013-12-27 – 2014-01-20 (×24): 10 mg via ORAL
  Filled 2013-12-26 (×27): qty 1

## 2013-12-26 MED ORDER — DOCUSATE SODIUM 100 MG PO CAPS
100.0000 mg | ORAL_CAPSULE | Freq: Two times a day (BID) | ORAL | Status: DC
  Administered 2013-12-26 – 2013-12-30 (×7): 100 mg via ORAL
  Filled 2013-12-26 (×12): qty 1

## 2013-12-26 MED ORDER — WARFARIN - PHARMACIST DOSING INPATIENT
Freq: Every day | Status: DC
  Administered 2013-12-26 – 2014-01-18 (×5)

## 2013-12-26 MED ORDER — SENNOSIDES-DOCUSATE SODIUM 8.6-50 MG PO TABS
2.0000 | ORAL_TABLET | Freq: Two times a day (BID) | ORAL | Status: DC
  Administered 2013-12-26 – 2013-12-29 (×6): 2 via ORAL
  Filled 2013-12-26 (×7): qty 2

## 2013-12-26 MED ORDER — OXYCODONE HCL 5 MG PO TABS
15.0000 mg | ORAL_TABLET | ORAL | Status: DC | PRN
  Administered 2013-12-26 – 2014-01-20 (×77): 15 mg via ORAL
  Filled 2013-12-26 (×80): qty 3

## 2013-12-26 MED ORDER — ESCITALOPRAM OXALATE 10 MG PO TABS
10.0000 mg | ORAL_TABLET | Freq: Every day | ORAL | Status: DC
Start: 2013-12-26 — End: 2013-12-31
  Administered 2013-12-26 – 2013-12-30 (×5): 10 mg via ORAL
  Filled 2013-12-26 (×6): qty 1

## 2013-12-26 MED ORDER — SORBITOL 70 % SOLN
45.0000 mL | Freq: Once | Status: DC
  Filled 2013-12-26: qty 60

## 2013-12-26 MED ORDER — CARVEDILOL 3.125 MG PO TABS
3.1250 mg | ORAL_TABLET | Freq: Two times a day (BID) | ORAL | Status: DC
  Administered 2013-12-26 – 2013-12-28 (×4): 3.125 mg via ORAL
  Filled 2013-12-26 (×6): qty 1

## 2013-12-26 MED ORDER — TRAMADOL HCL 50 MG PO TABS
50.0000 mg | ORAL_TABLET | Freq: Four times a day (QID) | ORAL | Status: DC | PRN
  Administered 2014-01-01 – 2014-01-19 (×10): 50 mg via ORAL
  Filled 2013-12-26 (×10): qty 1

## 2013-12-26 MED ORDER — HYDROMORPHONE HCL 1 MG/ML IJ SOLN: 1.0000 mg | INTRAMUSCULAR | Status: DC | PRN

## 2013-12-26 MED ORDER — ACETAMINOPHEN 325 MG PO TABS
325.0000 mg | ORAL_TABLET | ORAL | Status: DC | PRN
Start: 2013-12-26 — End: 2014-01-20
  Administered 2014-01-04 – 2014-01-20 (×3): 650 mg via ORAL
  Filled 2013-12-26 (×3): qty 2

## 2013-12-26 MED ORDER — INSULIN GLARGINE 100 UNIT/ML ~~LOC~~ SOLN
35.0000 [IU] | Freq: Every day | SUBCUTANEOUS | Status: DC
  Administered 2013-12-26 – 2013-12-27 (×2): 35 [IU] via SUBCUTANEOUS
  Filled 2013-12-26 (×4): qty 0.35

## 2013-12-26 MED ORDER — PROCHLORPERAZINE EDISYLATE 5 MG/ML IJ SOLN: 5.0000 mg | Freq: Four times a day (QID) | INTRAMUSCULAR | Status: DC | PRN

## 2013-12-26 MED ORDER — PROCHLORPERAZINE 25 MG RE SUPP: 12.5000 mg | Freq: Four times a day (QID) | RECTAL | Status: DC | PRN

## 2013-12-26 MED ORDER — PRAVASTATIN SODIUM 20 MG PO TABS
20.0000 mg | ORAL_TABLET | Freq: Every day | ORAL | Status: DC
  Administered 2013-12-26 – 2014-01-20 (×26): 20 mg via ORAL
  Filled 2013-12-26 (×27): qty 1

## 2013-12-26 MED ORDER — PRO-STAT SUGAR FREE PO LIQD
30.0000 mL | Freq: Three times a day (TID) | ORAL | Status: DC
  Administered 2013-12-29 – 2014-01-10 (×6): 30 mL via ORAL
  Filled 2013-12-26 (×46): qty 30

## 2013-12-26 MED ORDER — METHOCARBAMOL 500 MG PO TABS
500.0000 mg | ORAL_TABLET | Freq: Four times a day (QID) | ORAL | Status: DC | PRN
  Administered 2013-12-27 – 2014-01-20 (×36): 500 mg via ORAL
  Filled 2013-12-26 (×38): qty 1

## 2013-12-26 MED ORDER — VANCOMYCIN HCL IN DEXTROSE 1-5 GM/200ML-% IV SOLN: 1000.0000 mg | Freq: Once | INTRAVENOUS | Status: DC

## 2013-12-26 MED ORDER — MENTHOL 3 MG MT LOZG
1.0000 | LOZENGE | OROMUCOSAL | Status: AC | PRN
  Administered 2014-01-16 – 2014-01-19 (×2): 3 mg via ORAL
  Filled 2013-12-26: qty 9

## 2013-12-26 MED ORDER — CARVEDILOL 3.125 MG PO TABS
3.1250 mg | ORAL_TABLET | Freq: Two times a day (BID) | ORAL | Status: DC
  Administered 2013-12-26: 3.125 mg via ORAL
  Filled 2013-12-26: qty 1

## 2013-12-26 NOTE — Progress Notes (Signed)
PMR Admission Coordinator Pre-Admission Assessment  Patient: [name] is an 38 y.o., male MRN: [id number] DOB: 1975-05-11 Height: 6\' 2"  (188 cm) Weight: (!) 174.726 kg (385 lb 3.2 oz)  Insurance Information Self pay - I have called financial counselor to see patient per patient's request. Patient wished to apply for disability and medicaid potentially. 12/17 pt applied for disability in Spring and was denied in April. Did appeal. Suanne Marker , financial counselor 2 [phone] to make medicaid application appointment and f/u with his disability  Medicaid Application Date: Case Manager: pending Disability Application Date: Case Worker: Pending and was on appeal from the Spring 2015  Emergency Contact Information Contact Information    Name Relation Home Work Mobile   Jackson Significant other   [phone]   Paradis,Betty Mother   [phone]   Westin, Spigelmyer [phone]       Current Medical History  Patient Admitting Diagnosis: R BKA  History of Present Illness: A 38 y.o. male with history of DM type 2 with diabetic foot ulcer, morbid obesity, HTN, CKD, on chronic coumadin for recurrent DVT/PE, medical non-compliance; who was admitted via MD office on 12/02 with fever chills, N/V and right foot pain. He was started on IV antibiotics for cellulitis right foot as well as IVF for AKI on CKD. MRI of right foot revealed skin ulcer on plantar surface 4th MTP with underlying abscess but no osteo or septic joint. Dr. Doran Durand was consulted for input and performed I and D at bedside with cultures. Cultures positive for MRSA. Patient developed recurrent fever with chills on 12/05 with progressive leucocytosis. Repeat MRI right foot on 12/06 revealed new abscess tracking medial plantar surface below 1st and 2nd  MT with extensive edema due to cellulitis as well as myositis. He was taken to OR on 12/7 for I and D by Dr. Veverly Fells. Antibiotics narrowed to vancomycin. Patient continued to have abdominal discomfort with N/V. Stool negative for C diff. CT abdomen/pelvis with atelectasis but no signs of infection. Patient with complaints of right hip pain and ultrasound with superficial abscess. Pain has improved but he continued to have persistent leucocytosis and ID consulted for input. Dr. Megan Salon recommended I and D of superficial right buttock abscess as well as continuing Vancomycin alone.   Patient continued to have poor wound healing with wet gangrene and failed attempts at limb salvage. He underwent R-BKA with I and D right buttock abscess by Dr. Doran Durand on 12/22/13. PT evaluation done yesterday and patient noted to have limitations with mobility as well as anxiety and fear with activity. Bio Tech consulted for [employer] device for limb protection.  Past Medical History  Past Medical History  Diagnosis Date  . DVT (deep venous thrombosis) 09/2002    patient reports additional DVTs in '06 & '11 (unconfirmed)  . Pulmonary embolism 09/2002    treated with 6 months of warfarin  . Hypertension   . Chest pain, neg MI, normal coronaries by cath 02/18/2013  . Hyperlipidemia 02/19/2013  . Acute venous embolism and thrombosis of deep vessels of proximal lower extremity 07/19/2011  . Type I diabetes mellitus dx'd 2001  . GERD (gastroesophageal reflux disease)   . Diabetic ulcer of right foot   . CKD (chronic kidney disease) stage 3, GFR 30-59 ml/min 02/19/2013  . Nephrotic syndrome     Family History  family history includes Diabetes in an other family member; Heart disease in an other family member; Obesity in an other family member.  Prior Rehab/Hospitalizations: No previous rehab  Current Medications  Current facility-administered medications: 0.9 % sodium chloride  infusion, , Intravenous, Continuous, [name], MD, Last Rate: 100 mL/hr at 12/25/13 0449; amLODipine (NORVASC) tablet 10 mg, 10 mg, Oral, Daily, Jacques Earthly, MD, 10 mg at 12/26/13 1013; antiseptic oral rinse (CPC / CETYLPYRIDINIUM CHLORIDE 0.05%) solution 7 mL, 7 mL, Mouth Rinse, q12n4p, Madilyn Fireman, MD, 7 mL at 12/25/13 1258 carvedilol (COREG) tablet 3.125 mg, 3.125 mg, Oral, BID WC, Jacques Earthly, MD; chlorhexidine (PERIDEX) 0.12 % solution 15 mL, 15 mL, Mouth Rinse, BID, Madilyn Fireman, MD, 15 mL at 12/25/13 0853; dextrose 50 % solution 25 mL, 25 mL, Intravenous, PRN, Luan Moore, MD; docusate sodium (COLACE) capsule 100 mg, 100 mg, Oral, BID, [name], MD, 100 mg at 12/26/13 1013 feeding supplement (GLUCERNA SHAKE) (GLUCERNA SHAKE) liquid 237 mL, 237 mL, Oral, BID BM, Jenifer A Williams, RD, 237 mL at 12/25/13 1504; HYDROcodone-acetaminophen (NORCO) 10-325 MG per tablet 1-2 tablet, 1-2 tablet, Oral, Q4H, Francesca Oman, DO, 2 tablet at 12/26/13 0810; HYDROmorphone (DILAUDID) injection 1 mg, 1 mg, Intravenous, Q3H PRN, Jacques Earthly, MD insulin aspart (novoLOG) injection 0-15 Units, 0-15 Units, Subcutaneous, TID WC, Jacques Earthly, MD, 2 Units at 12/25/13 1504; insulin glargine (LANTUS) injection 35 Units, 35 Units, Subcutaneous, QHS, Jacques Earthly, MD, 35 Units at 12/25/13 2208; labetalol (NORMODYNE,TRANDATE) injection 5 mg, 5 mg, Intravenous, Q2H PRN, Jacques Earthly, MD menthol-cetylpyridinium (CEPACOL) lozenge 3 mg, 1 lozenge, Oral, PRN, Albin Felling, MD, 3 mg at 12/18/13 2214; metoCLOPramide (REGLAN) tablet 5-10 mg, 5-10 mg, Oral, Q8H PRN **OR** metoCLOPramide (REGLAN) injection 5-10 mg, 5-10 mg, Intravenous, Q8H PRN, [name], MD; ondansetron [facility]) injection 4 mg, 4 mg, Intravenous, Q6H PRN, Jacques Earthly, MD, 4 mg at 12/22/13 0342 pravastatin (PRAVACHOL) tablet 20 mg, 20 mg, Oral, q1800, Francesca Oman, DO, 20 mg at 12/25/13 1809; senna (SENOKOT) tablet 8.6 mg, 1  tablet, Oral, BID, [name], MD, 8.6 mg at 12/26/13 1013; vancomycin (VANCOCIN) 1,250 mg in sodium chloride 0.9 % 250 mL IVPB, 1,250 mg, Intravenous, Q24H, Rolla Flatten, RPH, 1,250 mg at 12/25/13 [id number]; warfarin (COUMADIN) tablet 5 mg, 5 mg, Oral, ONCE-1800, Rande Lawman Rumbarger, [facility] Warfarin - Pharmacist Dosing Inpatient, , Does not apply, q1800, Aldine Contes, MD  Patients Current Diet: Diet Carb Modified  Precautions / Restrictions Precautions Precautions: Fall Precaution Comments: R post op shoe Restrictions Weight Bearing Restrictions: Yes RLE Weight Bearing: Non weight bearing Other Position/Activity Restrictions: new right BKA   Prior Activity Level Community (5-7x/wk): Went out daily. Lost job back in April, not working since. 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 Also volunteers at [employer].   Home Assistive Devices / Equipment Home Assistive Devices/Equipment: CBG Meter Home Equipment: None  Prior Functional Level Prior Function Level of Independence: Independent Comments: Independent prior to admission on 12/09/13.  Current Functional Level Cognition  Overall Cognitive Status: Within Functional Limits for tasks assessed Orientation Level: Oriented X4   Extremity Assessment (includes Sensation/Coordination)   Lower Extremity Assessment: RLE deficits/detail RLE Deficits / Details: able to lift leg antigravity and extend leg in sitting, bends knee well, kept KI on when in bed    ADLs  Overall ADL's : Needs assistance/impaired Eating/Feeding: Independent, Sitting Grooming: Oral care, Set up, Sitting Upper Body Bathing: Supervision/ safety, Sitting Lower Body Bathing: Maximal assistance, +2 for physical assistance, Sit to/from stand (+3 for safety) Upper Body Dressing : Supervision/safety, Bed level Lower Body Dressing: Total assistance, +2 for physical assistance, Sit to/from stand (+3 for safety) Toilet  Transfer: Maximal assistance, +2  for physical assistance Toilet Transfer Details (indicate cue type and reason): pt performed sit>stand from bed using bariatric steady and transfered to recliner chair with +3 max (A) Toileting- Clothing Manipulation and Hygiene: Minimal assistance, Sit to/from stand Tub/ Shower Transfer: Minimal assistance, Ambulation, Rolling walker Tub/Shower Transfer Details (indicate cue type and reason): simulated stepping over edge of tum with use of the RW for support and simulated tub wall. Functional mobility during ADLs: Minimal assistance, Rolling walker General ADL Comments: Pt repeatedly mentioned difficulty coping with BKA. Pt is limited by LLE weakness and pain which impair his independence.     Mobility  Overal bed mobility: Needs Assistance, +2 for physical assistance Bed Mobility: Sit to Supine Supine to sit: Mod assist Sit to supine: Supervision General bed mobility comments: Received sitting in chair upon PT arrival. Mod A to assist with BLEs to return to bed.    Transfers  Overall transfer level: Needs assistance Equipment used: None Transfers: Sit to/from Stand Sit to Stand: +2 physical assistance, Total assist, Max assist Stand pivot transfers: Min assist General transfer comment: Required multiple attempts to stand due to weakness/fatigue and pain in right stump. Stood from Psychologist, occupational. Total A of 2 with cues for technique and use of forward momentum to assist with elevating hips. Stood from [employer] Max A of 2 (higher surface) with cues for UE extension.    Ambulation / Gait / Stairs / Wheelchair Mobility  Ambulation/Gait Ambulation/Gait assistance: Min assist, Mod assist Ambulation Distance (Feet): 40 Feet Assistive device: Rolling walker (2 wheeled) Gait Pattern/deviations: Step-to pattern, Step-through pattern, Decreased stance time - right, Decreased stride length, Antalgic, Wide base of support Gait velocity: decreased General Gait Details: Pt requires max verbal  cues at times for safety, as he tends to let RW get too far ahead of him and demonstrates impulsivity when getting back into room to recliner. Poor safety awareness when attempting to negotiate around obstacles in room.     Posture / Balance Dynamic Sitting Balance Sitting balance - Comments: Difficulty bringing trunk to upright seated position in recliner, Min A.    Special needs/care consideration BiPAP/CPAP No CPM No Continuous Drip IV KVO Dialysis No  Life Vest No Oxygen NO Special Bed No Trach Size No Wound Vac (area) NO  Skin Has a new R BKA with stump shrinker attached. Also has a wound on buttocks which patient says was "lanced". Dressing changes to right buttocks I and D 12/14   Bowel mgmt: Had BM 12/22/13 per patient. Bladder mgmt: Voiding in urinal WDL Diabetic mgmt Type 1 on both oral and insulin medications at home    Previous Home Environment Living Arrangements: Alone Lives With: Alone Available Help at Discharge: Family Type of Home: House Home Layout: One level Alternate Level Stairs-Rails: Left Alternate Level Stairs-Number of Steps: two level at mother's with 12 steps to bedrooms Home Access: Stairs to enter Entrance Stairs-Rails: Right Entrance Stairs-Number of Steps: 4 steps to enter at girlfriend's  Bathroom Shower/Tub: Tub/shower unit, Architectural technologist: Standard (Mom's house may be handicapped height, girlfriends is standard.) Home Care Services: No  Discharge Living Setting Plans for Discharge Living Setting: Other (Comment) (Lives in a townhouse with 2 levels and 4 steps into home.) May go to girlfriends home with one level home and 4 step entry. Type of Home at Discharge: House Discharge Home Layout: Two level, Bed/bath upstairs Alternate Level Stairs-Number of Steps: 16-20 steps Discharge Home Access: Stairs to enter Entrance Stairs-Number  of Steps: 2 up to porch and 2 into townhouse. Does the  patient have any problems obtaining your medications?: Yes (Describe) ("paying for them") Pt trying to decide between his Mom's home and his girlfriend's home due to accessibility issues. He seems unaware that he will need w/c accessibility . He feels he will be mobilizing well with walker at d/c  Social/Family/Support Systems Patient Roles: Parent Contact Information: Margarita Mail - girlfriend [phone] Anticipated Caregiver: GF, sister, mom Anticipated Caregiver's Contact Information: see above Ability/Limitations of Caregiver: Mom works. Sister works PT. Nephews are 46 and 57 yo. May decide to stay with his girlfriend. Caregiver Availability: Other (Comment) Discharge Plan Discussed with Primary Caregiver: Yes Is Caregiver In Agreement with Plan?: Yes Does Caregiver/Family have Issues with Lodging/Transportation while Pt is in Rehab?: No  Goals/Additional Needs Patient/Family Goal for Rehab: PT and OT Mod I goals at w/c level Expected length of stay: 7-10 days Cultural Considerations: None Dietary Needs: Carb mod med cal, thin liquids Equipment Needs: TBD Additional Information: I discussed with pt w/c accessibility of GF;s home and Mom;s home. Pt/Family Agrees to Admission and willing to participate: Yes Program Orientation Provided & Reviewed with Pt/Caregiver Including Roles & Responsibilities: Yes  Decrease burden of Care through IP rehab admission: N/A  Possible need for SNF placement upon discharge: Yes, if patient does not make sufficient progress in rehab.  Patient Condition: This patient's condition remains as documented in the consult dated 12/26/13, in which the Rehabilitation Physician determined and documented that the patient's condition is appropriate for intensive rehabilitative care in an inpatient rehabilitation facility. Will admit to inpatient rehab today.  Preadmission Screen Completed By: Cleatrice Burke, 12/26/2013 12:01  PM ______________________________________________________________________  Discussed status with Dr. Naaman Plummer on 12/27/2013 at 1201 and received telephone approval for admission today.  Admission Coordinator: Cleatrice Burke, time [id number] Date 12/26/2013.          Cosigned by: Meredith Staggers, MD at 12/26/2013 12:05 PM  Revision History     Date/Time User Provider Type Action   12/26/2013 12:05 PM Meredith Staggers, MD Physician Cosign   12/26/2013 12:02 PM Cleatrice Burke, RN Rehab Admission Coordinator Sign   12/25/2013 2:33 PM Retta Diones, RN Rehab Admission Coordinator Share   12/25/2013 2:30 PM Retta Diones, RN Rehab Admission Coordinator Share   12/25/2013 2:27 PM Retta Diones, RN Rehab Admission Coordinator Share   12/25/2013 2:22 PM Retta Diones, RN Rehab Admission Coordinator Share   12/25/2013 2:18 PM Retta Diones, RN Rehab Admission Coordinator Share   View Details Report

## 2013-12-26 NOTE — Progress Notes (Signed)
Subjective: 4 Days Post-Op Procedure(s) (LRB): AMPUTATION BELOW KNEE (Right) I&D RIGHT BUTTOCK (Right) Patient reports pain as mild.  OOB with PT.  Packing being changed to right buttock.  No n/v/f/c.  Objective: Vital signs in last 24 hours: Temp:  [98.6 F (37 C)-99.5 F (37.5 C)] 99.2 F (37.3 C) (12/18 [id number]) Pulse Rate:  [103-109] 103 (12/18 0632) Resp:  [18-22] 18 (12/18 [id number]) BP: (154-172)/(80-97) 154/88 mmHg (12/18 0632) SpO2:  [99 %-100 %] 100 % (12/18 [id number]) Weight:  [174.726 kg (385 lb 3.2 oz)] 174.726 kg (385 lb 3.2 oz) (12/18 [id number])  Intake/Output from previous day: 12/17 0701 - 12/18 0700 In: 1150 [P.O.:480; Blood:670] Out: 1925 [Urine:1925] Intake/Output this shift:     Recent Labs  12/24/13 0447 12/25/13 0519 12/25/13 0800 12/26/13 0103 12/26/13 0439  HGB 7.1* 6.9* 6.5* 8.4* 8.1*    Recent Labs  12/26/13 0103 12/26/13 0439  WBC 13.1* 12.5*  RBC 3.03* 3.00*  HCT 26.3* 25.6*  PLT 387 406*    Recent Labs  12/25/13 0519 12/26/13 0439  NA 138 139  K 4.6 4.9  CL 109 109  CO2 18* 20  BUN 37* 32*  CREATININE 2.24* 2.20*  GLUCOSE 95 117*  CALCIUM 8.0* 8.2*    Recent Labs  12/25/13 0519 12/26/13 0439  INR 1.31 1.37    PE:  wn wd male in nad.  R LE stmp incision closed with some serosang drainage.  No signs of infction  Assessment/Plan: 4 Days Post-Op Procedure(s) (LRB): AMPUTATION BELOW KNEE (Right) I&D RIGHT BUTTOCK (Right) R LE dressing changed and stump shrinker applied.  Stump protector in place.  Continue PT.  I'll sign off for theweekend and see the pt Monday AM if he's still here.  He can be discharged at any time from my persepctive.  [name] 12/26/2013, 8:42 AM

## 2013-12-26 NOTE — Progress Notes (Signed)
Vancomycin Protocol per Pharmacy  Indication: Cellulitis  This pt's measured vancomycin trough at 1800 was 23.1. The goal vanc trough is 10-15.  Will recheck trough with AM labs tomorrow to try to identify the correct dose of Vancomycin for this pt.  Plan: Check vanc trough with AM labs.

## 2013-12-26 NOTE — Progress Notes (Signed)
I met with pt at bedside. We discussed an inpt rehab admission and bed is available today. I contacted Dr. Randell Patient and will make arrangements to admit today. [phone]

## 2013-12-26 NOTE — Progress Notes (Signed)
Patient ID: [name], male   DOB: Feb 28, 1975, 38 y.o.   MRN: [id number] Patient admitted to the unit. Safety Plan discussed. Patient agreed to the safety plan. Right buttock wound draining copious amount of seriousanious bloody drainage, the old iodoform dressing 6 inches long was removed along with 4 (4x4) dry dressing.I should the old dressing to the patient and his girlfriend and stressed the importance of having the wound changed daily and as needed. Right BKA wound with stump shrinker under stump protector. Patient refuses to let me change Rt BKA wound, He replied, " Don't bother that wound, the doctor changed it today!":

## 2013-12-26 NOTE — Progress Notes (Signed)
Subjective: Patient reporting some soreness in his stump after a dressing change this morning. He reports that his urination is improved since yesterday with no significant hesitancy. No bowel movements yet.  Objective: Vital signs in last 24 hours: Filed Vitals:   12/25/13 1839 12/25/13 1854 12/25/13 2145 12/26/13 0632  BP: 157/90 160/82 164/80 154/88  Pulse: 106 107 106 103  Temp: 99.5 F (37.5 C) 99.5 F (37.5 C) 99.4 F (37.4 C) 99.2 F (37.3 C)  TempSrc: Oral Oral Oral Oral  Resp: 20 20 20 18   Height:      Weight:    385 lb 3.2 oz (174.726 kg)  SpO2: 99% 99% 100% 100%   Weight change: 2 lb (0.907 kg)  Intake/Output Summary (Last 24 hours) at 12/26/13 0843 Last data filed at 12/26/13 9563  Gross per 24 hour  Intake   1150 ml  Output   1925 ml  Net   -775 ml   General: NAD HEENT: [place]/AT Cardiac: RRR, no rubs, murmurs or gallops Pulm: clear to auscultation bilaterally, moving normal volumes of air Abd: soft, + BS, nondistended, nontender Ext: +1 B/L LE edema, dressing in place over R BKA without strikethrough Neuro: alert and oriented X3, responding appropriately, follows commands  Lab Results: Basic Metabolic Panel:  Recent Labs Lab 12/25/13 0519 12/26/13 0439  NA 138 139  K 4.6 4.9  CL 109 109  CO2 18* 20  GLUCOSE 95 117*  BUN 37* 32*  CREATININE 2.24* 2.20*  CALCIUM 8.0* 8.2*   Liver Function Tests:  Recent Labs Lab 12/21/13 0410 12/22/13 0454  AST 18 16  ALT 22 21  ALKPHOS 316* 246*  BILITOT 0.6 0.6  PROT 8.2 9.1*  ALBUMIN 2.0* 2.2*   CBC:  Recent Labs Lab 12/25/13 0519  12/26/13 0103 12/26/13 0439  WBC 13.8*  --  13.1* 12.5*  NEUTROABS 10.9*  --   --  9.8*  HGB 6.9*  < > 8.4* 8.1*  HCT 21.3*  < > 26.3* 25.6*  MCV 87.3  --  86.8 85.3  PLT 435*  --  387 406*  < > = values in this interval not displayed. CBG:  Recent Labs Lab 12/24/13 2110 12/25/13 0732 12/25/13 1449 12/25/13 1753 12/25/13 2147 12/26/13 0756  GLUCAP 147*  93 132* 113* 138* 99   Coagulation:  Recent Labs Lab 12/23/13 0424 12/24/13 0447 12/25/13 0519 12/26/13 0439  LABPROT 17.5* 18.1* 16.4* 17.0*  INR 1.42 1.48 1.31 1.37   Urine Drug Screen: Drugs of Abuse     Component Value Date/Time   LABOPIA POSITIVE* 07/12/2011 0038   COCAINSCRNUR NONE DETECTED 07/12/2011 0038   LABBENZ NONE DETECTED 07/12/2011 0038   AMPHETMU NONE DETECTED 07/12/2011 0038   THCU NONE DETECTED 07/12/2011 0038   LABBARB NONE DETECTED 07/12/2011 0038    Micro Results: Recent Results (from the past 240 hour(s))  Clostridium Difficile by PCR     Status: None   Collection Time: 12/16/13  5:34 PM  Result Value Ref Range Status   C difficile by pcr NEGATIVE NEGATIVE Final  Anaerobic culture     Status: None (Preliminary result)   Collection Time: 12/22/13  6:36 PM  Result Value Ref Range Status   Specimen Description ABSCESS BUTTOCKS  Final   Special Requests NONE  Final   Gram Stain   Final    RARE WBC PRESENT, PREDOMINANTLY PMN NO SQUAMOUS EPITHELIAL CELLS SEEN NO ORGANISMS SEEN Performed at [facility]  Final    NO ANAEROBES ISOLATED; CULTURE IN PROGRESS FOR 5 DAYS Performed at [facility]    Report Status PENDING  Incomplete  Culture, routine-abscess     Status: None (Preliminary result)   Collection Time: 12/22/13  6:36 PM  Result Value Ref Range Status   Specimen Description ABSCESS BUTTOCKS  Final   Special Requests NONE  Final   Gram Stain   Final    RARE WBC PRESENT, PREDOMINANTLY PMN NO SQUAMOUS EPITHELIAL CELLS SEEN NO ORGANISMS SEEN Performed at [facility]    Culture NO GROWTH Performed at [facility]   Final   Report Status PENDING  Incomplete   Studies/Results: No results found. Medications: I have reviewed the patient's current medications. Scheduled Meds: . amLODipine  10 mg Oral Daily  . antiseptic oral rinse  7 mL Mouth Rinse q12n4p  . chlorhexidine  15 mL Mouth Rinse BID   . docusate sodium  100 mg Oral BID  . feeding supplement (GLUCERNA SHAKE)  237 mL Oral BID BM  . HYDROcodone-acetaminophen  1-2 tablet Oral Q4H  . insulin aspart  0-15 Units Subcutaneous TID WC  . insulin glargine  35 Units Subcutaneous QHS  . pravastatin  20 mg Oral q1800  . senna  1 tablet Oral BID  . vancomycin  1,250 mg Intravenous Q24H  . Warfarin - Pharmacist Dosing Inpatient   Does not apply q1800   Continuous Infusions: . sodium chloride 100 mL/hr at 12/25/13 0449   PRN Meds:.dextrose, HYDROmorphone (DILAUDID) injection, labetalol, menthol-cetylpyridinium, metoCLOPramide **OR** metoCLOPramide (REGLAN) injection, ondansetron (ZOFRAN) IV   Assessment/Plan:  Diabetic foot ulcer and abscess, right foot with sepsis:  S/p Bedside I&D 12/11/2013.  Wound cx growing few methicillin resistant staph aureus.  S/p repeat I&D 12/14/2013. No organisms seen on repeat wound culture. D/c'd zosyn on day 9. Remains on IV vanc. Remains afebrile.  Appreciate orthopedics and ID recommendations/interventions.  S/p R BKA 12/22/2013. Leukocytosis downtrending. CIR consulted and awaiting bed availability (possibly today or tomorrow) - Norco 10/329m 1-2 tab q4hr pain, patient may refuse. - Docusate 1049mBID, senna BID - IV dilaudid 14m66m3h prn for breakthrough  Urinary retention: Resolved this morning with no significant hesitancy.   R posterior hip cellulitis with focal 7mm6muid collection in superficial subcutaneous fat.  S/p I&D 12/22/2013. Cx with no organism seen. - continue IV Vancomycin. Will continue antibiotics for 2 weeks post op. End date 01/05/2014.Can transition to doxycycline if discharged home. - continue to monitor  Diarrhea, abdominal pain, nausea/vomiting: C diff PCR negative. Likely 2/2 antibiotic associated diarrhea. Diarrhea has resolved. CT abd pelvis with no acute findings. LFTs wnl except elevated Alk phos to 2 times the normal limit. GGT elevated indicating likely hepatic origin.  RUQ US -Koreaild GB contraction, GB filled with sludge and small stones, murphy's sign negative, borderline GB wall thickness of 3.5mm,57m focal liver lesions. Resolved.  - continue to monitor  - Zofran 4mg I35m6hr prn, Reglan 5-10mg Q80mprn for breakthrough  - pain control as above  Acute on chronic anemia: Anemia of chronic disease. Has undergone three transfusions this hospitalization. Hgb stable this morning from 8.4 to 8.1.  - Continue to monitor CBC. Transfuse pRBC prn.  Suspected Diabetic ketoacidosis secondary to type 2 diabetes: Resolved.  Novolog 70/30 d/c due to patient with poor po and a few lows in the 60s.  CBG in the 100s yesterday and overnight.  - SSI mod, CBG q AC/HS  - Continue  Lantus 35u qHS; careful increase due to poor po and prior low CBGs.  AKI on CKD stage 2: Cr decreased to 2.2 from 2.24. (baseline around 1.6). FEurea and FENa suggestive of pre-renal etiology  - Continue to hold nephrotoxins  - NS @ 100cc/hr  - strict I&Os   Hypertension:    - Start Coreg 3.154m BID  - Continue amlodipine 10 mg.  - Holding home lisinopril-hydrochlorothiazide in the setting of AKI  - Hold home lasix 486mdaily as above  - IV labetalol prn for SBP >180  FEN:   - Carb modified  - NS @ 100cc/hr   Hx Recurrent DVTs, DVT ppx: SCDs, warfarin  Dispo: Awaiting CIR vs SNF vs home   The patient does have a current PCP (ELucious GrovesDO) [facility].  The patient does not have transportation limitations that hinder transportation to clinic appointments.  .Services Needed at time of discharge: Y = Yes, Blank = No PT: Rolling walker with 5" wheels, 3 in 1, Hospital bed, Wheelchair, Wheelchair cusion  OT:   RN:   Equipment:   Other:     LOS: 17 days   JeJacques EarthlyMD  12/26/2013, 8:43 AM

## 2013-12-26 NOTE — Progress Notes (Signed)
Physical Medicine and Rehabilitation Consult  Reason for Consult: R-BKA due to foot abscess with wet gangrene Referring Physician: Dr. Dareen Piano.    HPI: [name] is a 38 y.o. male with history of DM type 2 with diabetic foot ulcer, morbid obesity, HTN, CKD, on chronic coumadin for recurrent DVT/PE, medical non-compliance; who was admitted via MD office on 12/02 with fever chills, N/V and right foot pain. He was started on IV antibiotics for cellulitis right foot as well as IVF for AKI on CKD. MRI of right foot revealed skin ulcer on plantar surface 4th MTP with underlying abscess but no osteo or septic joint. Dr. Doran Durand was consulted for input and performed I and D at bedside with cultures. Cultures positive for MRSA. Patient developed recurrent fever with chills on 12/05 with progressive leucocytosis. Repeat MRI right foot on 12/06 revealed new abscess tracking medial plantar surface below 1st and 2nd MT with extensive edema due to cellulitis as well as myositis. He was taken to OR on 12/7 for I and D by Dr. Veverly Fells. Antibiotics narrowed to vancomycin. Patient continued to have abdominal discomfort with N/V. Stool negative for C diff. CT abdomen/pelvis with atelectasis but no signs of infection. Patient with complaints of right hip pain and ultrasound with superficial abscess. Pain has improved but he continued to have persistent leucocytosis and ID consulted for input. Dr. Megan Salon recommended I and D of superficial right buttock abscess as well as continuing Vancomycin alone.   Patient continued to have poor wound healing with wet gangrene and failed attempts at limb salvage. He underwent R-BKA with I and D right buttock abscess by Dr. Doran Durand on 12/22/13. PT evaluation done yesterday and patient noted to have limitations with mobility as well as anxiety and fear with activity. Bio Tech consulted for [employer] device for limb protection. CIR recommended by MD and rehab team.   Patient has right stump  as well as right foot pain. Review of Systems  HENT: Negative for hearing loss.  Eyes: Negative for blurred vision and double vision.  Respiratory: Negative for cough and shortness of breath.  Cardiovascular: Negative for chest pain and palpitations.  Gastrointestinal: Negative for nausea, vomiting, diarrhea and constipation.  Genitourinary: Negative for urgency and frequency.  Musculoskeletal: Positive for joint pain. Negative for myalgias and back pain.  Neurological: Positive for weakness. Negative for dizziness, tingling and headaches.  Psychiatric/Behavioral: Positive for depression. The patient is nervous/anxious.      Past Medical History  Diagnosis Date  . DVT (deep venous thrombosis) 09/2002    patient reports additional DVTs in '06 & '11 (unconfirmed)  . Pulmonary embolism 09/2002    treated with 6 months of warfarin  . Hypertension   . Chest pain, neg MI, normal coronaries by cath 02/18/2013  . Hyperlipidemia 02/19/2013  . Acute venous embolism and thrombosis of deep vessels of proximal lower extremity 07/19/2011  . Type I diabetes mellitus dx'd 2001  . GERD (gastroesophageal reflux disease)   . Diabetic ulcer of right foot   . CKD (chronic kidney disease) stage 3, GFR 30-59 ml/min 02/19/2013  . Nephrotic syndrome     Past Surgical History  Procedure Laterality Date  . Incision and drainage abscess  2007; 2015    "back"  . Cardiac catheterization  02/18/2013    normal coronaries  . I&d extremity Right 12/14/2013    Procedure: IRRIGATION AND DEBRIDEMENT RIGHT FOOT; Surgeon: [name], MD; Location: [place]; Service: Orthopedics; Laterality: Right;  . Left heart catheterization  with coronary angiogram N/A 02/18/2013    Procedure: LEFT HEART CATHETERIZATION WITH CORONARY ANGIOGRAM; Surgeon: [name], MD; Location: [facility] CATH LAB; Service: Cardiovascular; Laterality: N/A;  .  Amputation Right 12/22/2013    Procedure: AMPUTATION BELOW KNEE; Surgeon: [name], MD; Location: [place]; Service: Orthopedics; Laterality: Right;  . Incision and drainage of wound Right 12/22/2013    Procedure: I&D RIGHT BUTTOCK; Surgeon: [name], MD; Location: [place]; Service: Orthopedics; Laterality: Right;    Family History  Problem Relation Age of Onset  . Heart disease    . Diabetes    . Obesity      Social History: Unemployed. Lives with mother. Independent without AD. He reports that he has never smoked. He has never used smokeless tobacco. He reports that he drinks alcohol. He reports that he does not use illicit drugs.    Allergies: No Known Allergies    Medications Prior to Admission  Medication Sig Dispense Refill  . furosemide (LASIX) 40 MG tablet Take 1 tablet (40 mg total) by mouth daily. 30 tablet   . insulin NPH-regular Human (NOVOLIN 70/30) (70-30) 100 UNIT/ML injection Inject 35-50 Units into the skin 2 (two) times daily with a meal. 50 units in the morning and 45 units in the evening (Patient taking differently: Inject 45-50 Units into the skin 2 (two) times daily with a meal. 50 units in the morning and 45 units in the evening) 10 mL 11  . lisinopril-hydrochlorothiazide (PRINZIDE,ZESTORETIC) 20-25 MG per tablet Take 1 tablet by mouth daily. 30 tablet 6  . lovastatin (MEVACOR) 20 MG tablet Take 1 tablet (20 mg total) by mouth daily. 30 tablet 11  . metFORMIN (GLUCOPHAGE) 500 MG tablet Take 2 tablets (1,000 mg total) by mouth 2 (two) times daily with a meal. 120 tablet 6  . ranitidine (ZANTAC) 300 MG tablet Take 1 tablet (300 mg total) by mouth at bedtime. 30 tablet 3  . warfarin (COUMADIN) 5 MG tablet Take 1.5 tablets (7.5 mg) all days of week EXCEPT on Mondays and Thursdays take 2 tablets. 50 tablet 1    Home: Home Living Family/patient expects to be discharged to:: Private  residence Living Arrangements: Spouse/significant other, Parent Available Help at Discharge: Family Type of Home: House (townhouse at mothers) Home Access: Stairs to enter Technical brewer of Steps: 4 steps to enter at girlfriend's  Entrance Stairs-Rails: Right Home Layout: One level Alternate Level Stairs-Number of Steps: two level at mother's with 12 steps to bedrooms Alternate Level Stairs-Rails: Left Home Equipment: None  Functional History: Prior Function Level of Independence: Independent Functional Status:  Mobility: Bed Mobility Overal bed mobility: Needs Assistance, + 2 for safety/equipment, +2 for physical assistance Bed Mobility: Supine to Sit, Sit to Supine Supine to sit: Min assist, HOB elevated Sit to supine: Supervision General bed mobility comments: increased time and effort up to EOB, min assist for trunk upright; to supine supervision Transfers Overall transfer level: Needs assistance Equipment used: Rolling walker (2 wheeled) Transfers: Sit to/from Stand Sit to Stand: From elevated surface, +2 physical assistance, Max assist Stand pivot transfers: Min assist General transfer comment: attempted x 3 from elevated surface with walker in front, kept coming forward and not lifting hips; concern for sliding in floor so did not attempt again Ambulation/Gait Ambulation/Gait assistance: Min assist, Mod assist Ambulation Distance (Feet): 40 Feet Assistive device: Rolling walker (2 wheeled) Gait Pattern/deviations: Step-to pattern, Step-through pattern, Decreased stance time - right, Decreased stride length, Antalgic, Wide base of support Gait velocity:  decreased General Gait Details: Pt requires max verbal cues at times for safety, as he tends to let RW get too far ahead of him and demonstrates impulsivity when getting back into room to recliner. Poor safety awareness when attempting to negotiate around obstacles in room.     ADL: ADL Overall ADL's : Needs  assistance/impaired Eating/Feeding: Independent, Sitting Grooming: Wash/dry hands, Wash/dry face, Set up, Sitting Upper Body Bathing: Supervision/ safety, Sitting Lower Body Bathing: Minimal assistance, Sit to/from stand Upper Body Dressing : Supervision/safety, Bed level Lower Body Dressing: Maximal assistance, Sitting/lateral leans (post op shoe) Toilet Transfer: Minimal assistance, Stand-pivot, BSC, Requires wide/bariatric Toilet Transfer Details (indicate cue type and reason): simulated having counter on the right and use of the RW on the left. Toileting- Clothing Manipulation and Hygiene: Minimal assistance, Sit to/from stand Tub/ Shower Transfer: Minimal assistance, Ambulation, Rolling walker Tub/Shower Transfer Details (indicate cue type and reason): simulated stepping over edge of tum with use of the RW for support and simulated tub wall. Functional mobility during ADLs: Minimal assistance, Rolling walker General ADL Comments: Pt with flat affect, requiring encouragement to work with OT. Still not decided about what environment he will go upon d/c.  Cognition: Cognition Overall Cognitive Status: Within Functional Limits for tasks assessed Orientation Level: Oriented X4 Cognition Arousal/Alertness: Awake/alert Behavior During Therapy: WFL for tasks assessed/performed (admits to depressed/crying this morning) Overall Cognitive Status: Within Functional Limits for tasks assessed  Blood pressure 167/99, pulse 105, temperature 98.7 F (37.1 C), temperature source Oral, resp. rate 18, height 6\' 2"  (1.88 m), weight 160.029 kg (352 lb 12.8 oz), SpO2 94 %. Physical Exam  Nursing note and vitals reviewed. Constitutional: He is oriented to person, place, and time. He appears well-developed and well-nourished.  HENT:  Head: Normocephalic and atraumatic.  Eyes: Conjunctivae are normal. Pupils are equal, round, and reactive to light.  Neck: Normal range of motion. Neck supple.    Cardiovascular: Normal rate and regular rhythm.  Respiratory: Effort normal and breath sounds normal. No respiratory distress. He has no wheezes.  GI: Soft. Bowel sounds are normal. He exhibits no distension. There is no tenderness.  Musculoskeletal:  LLE with 1-2+ pedal edema  Neurological: He is alert and oriented to person, place, and time.  Skin: Skin is warm and dry.  Psychiatric:  Appears depressed and seems to be having a difficult hard time dealing with limb loss.  Motor strength is 5/5 bilateral deltoids, biceps, triceps, grip 3 minus left hip flexor 2 minus right hip flexor, 4 minus knee extensor and ankle dorsiflexion plantar flexor Sensation reduced in the left foot. Hypersensitive to touch right stump   Lab Results Last 24 Hours    Results for orders placed or performed during the hospital encounter of 12/09/13 (from the past 24 hour(s))  Glucose, capillary Status: Abnormal   Collection Time: 12/23/13 12:15 PM  Result Value Ref Range   Glucose-Capillary 201 (H) 70 - 99 mg/dL  Urea nitrogen, urine Status: None   Collection Time: 12/23/13 12:35 PM  Result Value Ref Range   Urea Nitrogen, Ur 541 mg/dL  Creatinine, urine, random Status: None   Collection Time: 12/23/13 12:35 PM  Result Value Ref Range   Creatinine, Urine 154.74 mg/dL  Sodium, urine, random Status: None   Collection Time: 12/23/13 12:35 PM  Result Value Ref Range   Sodium, Ur <20 mEq/L  CBC Status: Abnormal   Collection Time: 12/23/13 4:00 PM  Result Value Ref Range   WBC 21.0 (H) 4.0 -  10.5 K/uL   RBC 2.52 (L) 4.22 - 5.81 MIL/uL   Hemoglobin 7.1 (L) 13.0 - 17.0 g/dL   HCT 21.5 (L) 39.0 - 52.0 %   MCV 85.3 78.0 - 100.0 fL   MCH 28.2 26.0 - 34.0 pg   MCHC 33.0 30.0 - 36.0 g/dL   RDW 17.2 (H) 11.5 - 15.5 %   Platelets 504 (H) 150 - 400 K/uL  Glucose, capillary Status: Abnormal    Collection Time: 12/23/13 4:49 PM  Result Value Ref Range   Glucose-Capillary 204 (H) 70 - 99 mg/dL  Glucose, capillary Status: Abnormal   Collection Time: 12/23/13 8:51 PM  Result Value Ref Range   Glucose-Capillary 159 (H) 70 - 99 mg/dL   Comment 1 Notify RN   Protime-INR Status: Abnormal   Collection Time: 12/24/13 4:47 AM  Result Value Ref Range   Prothrombin Time 18.1 (H) 11.6 - 15.2 seconds   INR 1.48 0.00 - 1.49  CBC with Differential Status: Abnormal   Collection Time: 12/24/13 4:47 AM  Result Value Ref Range   WBC 16.0 (H) 4.0 - 10.5 K/uL   RBC 2.62 (L) 4.22 - 5.81 MIL/uL   Hemoglobin 7.1 (L) 13.0 - 17.0 g/dL   HCT 22.4 (L) 39.0 - 52.0 %   MCV 85.5 78.0 - 100.0 fL   MCH 27.1 26.0 - 34.0 pg   MCHC 31.7 30.0 - 36.0 g/dL   RDW 16.8 (H) 11.5 - 15.5 %   Platelets 479 (H) 150 - 400 K/uL   Neutrophils Relative % 77 43 - 77 %   Lymphocytes Relative 13 12 - 46 %   Monocytes Relative 8 3 - 12 %   Eosinophils Relative 2 0 - 5 %   Basophils Relative 0 0 - 1 %   Neutro Abs 12.3 (H) 1.7 - 7.7 K/uL   Lymphs Abs 2.1 0.7 - 4.0 K/uL   Monocytes Absolute 1.3 (H) 0.1 - 1.0 K/uL   Eosinophils Absolute 0.3 0.0 - 0.7 K/uL   Basophils Absolute 0.0 0.0 - 0.1 K/uL   RBC Morphology POLYCHROMASIA PRESENT   Basic metabolic panel Status: Abnormal   Collection Time: 12/24/13 4:47 AM  Result Value Ref Range   Sodium 137 137 - 147 mEq/L   Potassium 4.5 3.7 - 5.3 mEq/L   Chloride 105 96 - 112 mEq/L   CO2 20 19 - 32 mEq/L   Glucose, Bld 150 (H) 70 - 99 mg/dL   BUN 47 (H) 6 - 23 mg/dL   Creatinine, Ser 2.78 (H) 0.50 - 1.35 mg/dL   Calcium 8.3 (L) 8.4 - 10.5 mg/dL   GFR calc non Af Amer 27 (L) >90 mL/min   GFR calc Af Amer 32 (L) >90 mL/min   Anion gap 12 5 - 15  Glucose, capillary Status: Abnormal     Collection Time: 12/24/13 7:15 AM  Result Value Ref Range   Glucose-Capillary 142 (H) 70 - 99 mg/dL      Imaging Results (Last 48 hours)    No results found.    Assessment/Plan: Diagnosis: Right BKA Related to diabetes with peripheral vascular disease 1. Does the need for close, 24 hr/day medical supervision in concert with the patient's rehab needs make it unreasonable for this patient to be served in a less intensive setting? Yes 2. Co-Morbidities requiring supervision/potential complications: Hypertension, morbid obesity, chronic kidney disease stage II 3. Due to bladder management, bowel management, safety, skin/wound care, disease management, medication administration, pain management and patient education, does  the patient require 24 hr/day rehab nursing? Yes 4. Does the patient require coordinated care of a physician, rehab nurse, PT (1-2 hrs/day, 5 days/week) and OT (1-2 hrs/day, 5 days/week) to address physical and functional deficits in the context of the above medical diagnosis(es)? Yes Addressing deficits in the following areas: balance, endurance, locomotion, strength, transferring, bathing, dressing and toileting 5. Can the patient actively participate in an intensive therapy program of at least 3 hrs of therapy per day at least 5 days per week? Yes 6. The potential for patient to make measurable gains while on inpatient rehab is good 7. Anticipated functional outcomes upon discharge from inpatient rehab are modified independent with PT, modified independent with OT, n/a with SLP. 8. Estimated rehab length of stay to reach the above functional goals is: 7-10 days 9. Does the patient have adequate social supports and living environment to accommodate these discharge functional goals? Potentially 10. Anticipated D/C setting: Home 11. Anticipated post D/C treatments: [place] therapy 12. Overall Rehab/Functional Prognosis: excellent  RECOMMENDATIONS: This patient's  condition is appropriate for continued rehabilitative care in the following setting: CIR Patient has agreed to participate in recommended program. Yes Note that insurance prior authorization may be required for reimbursement for recommended care.  Comment:     12/24/2013       Revision History     Date/Time User Provider Type Action   12/24/2013 12:18 PM Charlett Blake, MD Physician Sign   12/24/2013 10:10 AM Bary Leriche, PA-C Physician Assistant Share   View Details Report       Routing History     Date/Time From To Method   12/24/2013 12:18 PM Charlett Blake, MD Charlett Blake, MD In Basket   12/24/2013 12:18 PM Charlett Blake, MD Lucious Groves, DO In Basket

## 2013-12-26 NOTE — Progress Notes (Signed)
ANTICOAGULATION CONSULT NOTE - Follow Up Consult  Pharmacy Consult for Warfarin Indication: Hx recurrent DVTs  No Known Allergies  Patient Measurements: Height: 6\' 2"  (188 cm) Weight: (!) 385 lb 3.2 oz (174.726 kg) IBW/kg (Calculated) : 82.2  Vital Signs: Temp: 99.2 F (37.3 C) (12/18 0632) Temp Source: Oral (12/18 [id number]) BP: 154/88 mmHg (12/18 0632) Pulse Rate: 103 (12/18 0632)  Labs:  Recent Labs  12/24/13 0447 12/25/13 0519 12/25/13 0800 12/26/13 0103 12/26/13 0439  HGB 7.1* 6.9* 6.5* 8.4* 8.1*  HCT 22.4* 21.3* 20.8* 26.3* 25.6*  PLT 479* 435*  --  387 406*  LABPROT 18.1* 16.4*  --   --  17.0*  INR 1.48 1.31  --   --  1.37  CREATININE 2.78* 2.24*  --   --  2.20*    Estimated Creatinine Clearance: 77.5 mL/min (by C-G formula based on Cr of 2.2).   Assessment: 66 YOM on warfarin PTA for hx of recurrent DVTs. INR on admission was SUBtherapeutic and had a rapid rise when doses were initiated on 12/4. At this time, the cause of the rapid rise is unknown - LFTs are wnl. INR was reversed with Vit K 5 mg IV on 12/13 for R-BKA done on 12/14. Pharmacy was consulted to resume warfarin post-op on 12/14 however RN held the dose per discussion with the primary team. Discussed with the primary team today and clarified that warfarin to be resumed this 12/15. INR today is SUBtherapeutic (1.37, goal of 2-3). Hgb remains low but no bleeding noted. MD is transfusing PRN. Potentially related to procedures.  Goal of Therapy:  INR 2-3    Plan:  1. Coumadin 5mg  PO x 1 tonight - coumadin requirements unclear, INR has been fluctuating and requiring a wide range of doses 2. F/u AM INR 3. Consider a heparin bridge while INR is subtherapeutic  Salome Arnt, PharmD, BCPS Pager # [phone] 12/26/2013 9:55 AM

## 2013-12-26 NOTE — Interval H&P Note (Signed)
[name] was admitted today to Inpatient Rehabilitation with the diagnosis of right BKA.  The patient's history has been reviewed, patient examined, and there is no change in status.  Patient continues to be appropriate for intensive inpatient rehabilitation.  I have reviewed the patient's chart and labs.  Questions were answered to the patient's satisfaction.  [name] T 12/26/2013, 8:43 PM

## 2013-12-26 NOTE — Discharge Summary (Signed)
Name: [name] MRN: [id number] DOB: 1975/02/08 38 y.o. PCP: Lucious Groves, DO  Date of Admission: 12/09/2013  5:13 PM Date of Discharge: 12/26/2013 Attending Physician: Aldine Contes, MD  Discharge Diagnosis: Principal Problem:   Cellulitis and abscess of right foot Active Problems:   DM (diabetes mellitus) type I uncontrolled, periph vascular disorder   Morbid obesity   DEEP VENOUS THROMBOPHLEBITIS, HX OF   Foot ulcer, right   Essential hypertension   Hyperlipidemia   CKD stage 3 due to type 1 diabetes mellitus   GERD (gastroesophageal reflux disease)   Leukocytosis   Supratherapeutic INR   Abscess of right hip   Cellulitis and abscess   Diabetic ulcer of right foot  Discharge Medications:   Medication List    ASK your doctor about these medications        furosemide 40 MG tablet  Commonly known as:  LASIX  Take 1 tablet (40 mg total) by mouth daily.     insulin NPH-regular Human (70-30) 100 UNIT/ML injection  Commonly known as:  NOVOLIN 70/30  Inject 35-50 Units into the skin 2 (two) times daily with a meal. 50 units in the morning and 45 units in the evening     lisinopril-hydrochlorothiazide 20-25 MG per tablet  Commonly known as:  PRINZIDE,ZESTORETIC  Take 1 tablet by mouth daily.     lovastatin 20 MG tablet  Commonly known as:  MEVACOR  Take 1 tablet (20 mg total) by mouth daily.     metFORMIN 500 MG tablet  Commonly known as:  GLUCOPHAGE  Take 2 tablets (1,000 mg total) by mouth 2 (two) times daily with a meal.     ranitidine 300 MG tablet  Commonly known as:  ZANTAC  Take 1 tablet (300 mg total) by mouth at bedtime.     warfarin 5 MG tablet  Commonly known as:  COUMADIN  Take 1.5 tablets (7.5 mg) all days of week EXCEPT on Mondays and Thursdays take 2 tablets.        Disposition and follow-up:   [name] was transferred from [facility] to [facility] in stable condition.   1.  AKI: Pre-renal by FENa and FEUrea.  Has been improving with IV fluids and increasing PO intake. Will need BMP followed. Continuing to hold ACE inhibitor and Lasix. May need eventual referral to nephrology as outpatient. At outpatient follow up, he will need to have his DM and HTN reevaluted as his regimen was changed during hospitalization to Lantus 35 units QHS and amlodipine 5m daily and Coreg 3.1229mBID. May be switched back to 70/30 at discharge from [facility].  2.  Labs / imaging needed: BMP in 2-3 days, CMP at outpatient follow up (alk phos was elevated during hospitalization), CBC  3.  Pending labs/ test needing follow-up: None  Follow-up Appointments: Follow-up Information    Follow up with [facility]           . Schedule an appointment as soon as possible for a visit in 1 week.   Contact information:   [phone]    Follow up with HE[facility] SimmerMD. Schedule an appointment as soon as possible for a visit in 4 weeks.   Specialty:  Orthopedic Surgery   Contact information:   [phone]     [facility]  Rachel Moulds, DO. Schedule an appointment as soon as possible for a visit in 2 weeks.   Specialty:  Internal Medicine   Why:  When patient is close to discharge from [facility], please call to set up follow up appointment for patient in Internal Medicine Clinic   Contact information:   [place] [address] [phone]      Consultations: Treatment Team:  [name], MD  Procedures Performed:  Ct Abdomen Pelvis Wo Contrast  12/19/2013   CLINICAL DATA:  Pt states been having abd pain and vomiting all week. Physician order notes: Patient on IV abx x 11 days for foot cellulitis and abscess with c/o of vomiting/abdominal pain/decreased po for several days. Diarrhea initially which has resolved. Cdiff negative. WBC continues to increase despite  appropriate abx selection and I&D of abscess. No IV contrast due to CKD. Increasing INR and decreasing HGB.  EXAM: CT ABDOMEN AND PELVIS WITHOUT CONTRAST  TECHNIQUE: Multidetector CT imaging of the abdomen and pelvis was performed following the standard protocol without IV contrast.  COMPARISON:  None.  FINDINGS: Lung bases demonstrate subtle hazy density likely atelectasis although cannot exclude developing infection. Small amount of pericardial fluid is present.  Abdominal images demonstrate a normal liver, spleen, pancreas and adrenal glands. Gallbladder demonstrates minimal increased density material over the fundus which may represent sludge/ stones. Kidneys are normal in size without hydronephrosis or nephrolithiasis. Subtle ill definition of the intrarenal collecting systems. No significant perinephric fluid/inflammation. Ureters are within normal. The appendix is normal. Small bowel is within normal. There is no free fluid or inflammatory change within the abdomen.  There is mild diffuse subcutaneous edema over the abdominal wall.  Pelvic images demonstrate the bladder, prostate and rectum to be within normal. There are mild degenerative changes of the spine and hips.  IMPRESSION: No acute findings in the abdomen/pelvis.  Possible sludge versus stones within the gallbladder fundus.  Subtle hazy attenuation over the lung bases likely atelectasis although cannot exclude developing infection.  Mild subcutaneous edema over the abdominal wall.   Electronically Signed   By: Marin Olp M.D.   On: 12/19/2013 21:22   Dg Chest 2 View  12/16/2013   CLINICAL DATA:  Productive cough x 2 days. SOB "sometimes" says patient. No chest pain. Hx of Right foot surgery 2 days ago. Hx of pulmonary embolism 09/2002, HTN, Type 1 diabetes mellitus, diabetic ulcer of right foot, CKD - stage 3  EXAM: CHEST - 2 VIEW  COMPARISON:  12/13/2013  FINDINGS: Persistent low lung volumes with resultant crowding of bronchovascular  structures. No focal infiltrate or overt edema. Heart size upper limits normal for technique, stable. No effusion. Visualized skeletal structures are unremarkable.  IMPRESSION: 1. Persistent low volumes.  No acute disease.   Electronically Signed   By: Arne Cleveland M.D.   On: 12/16/2013 19:45   Dg Chest 2 View  12/13/2013   CLINICAL DATA:  Acute onset of nausea, fever and shortness of breath for 1 week. Initial encounter.  EXAM: CHEST  2 VIEW  COMPARISON:  Chest radiograph performed 02/19/2013  FINDINGS: The lungs are significantly hypoexpanded, with vascular crowding. There is no evidence of focal opacification, pleural effusion or pneumothorax. The lateral view is suboptimal due to lung hypoexpansion.  The heart is borderline normal in size; the mediastinal contour is within normal limits. No acute osseous abnormalities are seen.  IMPRESSION: Lungs significantly hypoexpanded but grossly clear.   Electronically Signed   By: Jacqulynn Cadet  Chang M.D.   On: 12/13/2013 22:47   Dg Abd 1 View  12/13/2013   CLINICAL DATA:  Acute onset of fever, shortness of breath and nausea for 1 week. Subsequent encounter.  EXAM: ABDOMEN - 1 VIEW  COMPARISON:  Abdominal radiograph performed 12/12/2013  FINDINGS: The visualized bowel gas pattern is unremarkable. Scattered air filled loops of colon are seen; no abnormal dilatation of small bowel loops is seen to suggest small bowel obstruction. No free intra-abdominal air is identified, though evaluation for free air is limited on a single supine view.  The visualized osseous structures are within normal limits; the sacroiliac joints are unremarkable in appearance.  IMPRESSION: Unremarkable bowel gas pattern; no free intra-abdominal air seen.   Electronically Signed   By: Garald Balding M.D.   On: 12/13/2013 22:48   Mr Foot Right Wo Contrast  12/14/2013   CLINICAL DATA:  Right foot cellulitis and abscess.  EXAM: MRI OF THE RIGHT FOREFOOT WITHOUT CONTRAST  TECHNIQUE: Multiplanar,  multisequence MR imaging was performed. No intravenous contrast was administered.  COMPARISON:  12/10/2013  FINDINGS: Extensive subcutaneous edema tracking along the foot, especially the dorsum of the foot. Infiltrative subcutaneous edema tracks along the plantar musculature of the foot.  A 3.4 by 1.2 by 3.0 cm abscess is present tracking from the subcutaneous tissues just superficial to the distal medial band of the plantar fascia (which is focally thickened) and tracking along an within the plantar musculature of the foot plantar to the space between the distal first and second metatarsals.  The prior abscess plantar to the fourth MTP joint appears as though it has drained to the scan. Overlying ulceration noted.  There is an effusion of the first MTP joint. Questionable increased signal distally in the toes is somewhat diffuse and probably related to field heterogeneity rather than osteomyelitis. Lisfranc joint alignment normal with Lisfranc ligament intact. Spurring along the Chopart joint and along the Lisfranc joint noted.  IMPRESSION: 1. Although the abscess below the fourth MTP joint has been drained or has spontaneously drained to the plantar ulcer surface, there is now an abscess tracking in the medial plantar subcutaneous tissues below the distal first and second metatarsals as described above. Extensive subcutaneous and muscular edema typical for cellulitis and myositis. There is thickening of the medial band of the plantar fascia focally adjacent to the abscess. No definite osteomyelitis.   Electronically Signed   By: Sherryl Barters M.D.   On: 12/14/2013 19:32   Mr Foot Right Wo Contrast  12/11/2013   CLINICAL DATA:  Diabetic patient with a draining foot ulceration. Fever and chills.  EXAM: MRI OF THE RIGHT FOREFOOT WITHOUT CONTRAST  TECHNIQUE: Multiplanar, multisequence MR imaging was performed. No intravenous contrast was administered.  COMPARISON:  Plain films right foot 12/09/2013 and MRI right  foot 02/10/2012.  FINDINGS: Skin ulceration is identified on the plantar surface of the foot at the level of the fourth MTP joint. Subjacent to the ulceration, there is a fluid collection measuring 1.6 cm long by 1.2 cm craniocaudal by 1.6 cm transverse highly suspicious for abscess. No bone marrow signal abnormality to suggest osteomyelitis is identified. There is only trace amount of fluid in the fourth MTP joint. Similar small joint effusions are seen in the remaining MTP joints. Soft tissues of the foot or diffusely edematous.  IMPRESSION: Skin ulceration on the plantar surface of the foot the level the fourth MTP joint with an underlying fluid collection compatible with abscess. No evidence of osteomyelitis  or septic joint is identified.   Electronically Signed   By: Inge Rise M.D.   On: 12/11/2013 08:39   US Abdomen Limited  12/21/2013   CLINICAL DATA:  Increased alkaline phosphatase. Nausea and vomiting for a week.  EXAM: US ABDOMEN LIMITED - RIGHT UPPER QUADRANT  COMPARISON:  CT abdomen and pelvis 12/19/2013  FINDINGS: Technically limited examination due to patient's body habitus.  Gallbladder:  Gallbladder is somewhat contracted with diffusely increased echoes pattern centrally consistent with small stones and sludge. Borderline gallbladder wall thickness at 3.5 mm. Murphy's sign is negative.  Common bile duct:  Diameter: 7.2 mm, upper limits of normal.  Liver:  Diffusely increased parenchymal echotexture suggesting fatty infiltration. No focal liver lesions appreciated.  IMPRESSION: Mild gallbladder contraction. Gallbladder is filled with sludge and small stones. Murphy's sign is negative.   Electronically Signed   By: Lucienne Capers M.D.   On: 12/21/2013 03:04   [place] Extrem Low Right Ltd  12/19/2013   CLINICAL DATA:  38 year old male with induration posterior to the left hip. Evaluate for subcutaneous abscess  EXAM: ULTRASOUND LEFT LOWER EXTREMITY LIMITED  TECHNIQUE: Ultrasound examination  of the lower extremity soft tissues was performed in the area of clinical concern.  COMPARISON:  None  FINDINGS: Focal sonographic interrogation of the region of clinical concern demonstrates a small 7 mm focal anechoic fluid collection consistent with abscess. Otherwise, the region contains a hypoechoic but unremarkable appearing subcutaneous adipose tissue. Of note, the region is exquisitely painful to touch during sonography.  IMPRESSION: Cellulitis with focal 7 mm fluid collection concerning for small abscess in the superficial subcutaneous fat posterior to the left hip.   Electronically Signed   By: Jacqulynn Cadet M.D.   On: 12/19/2013 16:00   Dg Abd Portable 1v  12/13/2013   CLINICAL DATA:  Acute onset of abdominal distention. Initial encounter.  EXAM: PORTABLE ABDOMEN - 1 VIEW  COMPARISON:  Lumbar spine radiographs performed 03/31/2013  FINDINGS: The visualized bowel gas pattern is unremarkable. Scattered air filled loops of colon are seen; no abnormal dilatation of small bowel loops is seen to suggest small bowel obstruction. No free intra-abdominal air is identified, though evaluation for free air is limited on a single supine view.  The visualized osseous structures are within normal limits; the sacroiliac joints are unremarkable in appearance.  IMPRESSION: Unremarkable bowel gas pattern; no free intra-abdominal air seen.   Electronically Signed   By: Garald Balding M.D.   On: 12/13/2013 01:00   Dg Foot 2 Views Right  12/10/2013   CLINICAL DATA:  Diabetic ulceration on the plantar surface of the right foot for the past year, recently worsening. Initial encounter.  EXAM: RIGHT FOOT - 2 VIEW  COMPARISON:  MRI of the right foot performed 02/10/2012, and right foot radiographs performed 02/09/2012  FINDINGS: There is no evidence of fracture or dislocation. The joint spaces are preserved. There is no evidence of talar subluxation; the subtalar joint is unremarkable in appearance. A posterior calcaneal  spur is noted. Mild spurring is noted dorsally at the midfoot.  The known soft tissue ulceration is not well characterized on radiograph. No radiopaque foreign bodies are seen. There is no evidence of osseous erosion.  IMPRESSION: No radiopaque foreign bodies seen. No evidence of osseous erosion to suggest osteomyelitis. No evidence of fracture or dislocation.   Electronically Signed   By: Garald Balding M.D.   On: 12/10/2013 02:05    Admission HPI: Patient is a 38 year old gentleman with  a history of type 2 diabetes, obesity, hypertension, recurrent DVTs on chronic Coumadin therapy who presents with a foot ulcer. Patient states he had issues a lesion on his right foot for most a year. Patient has repeatedly shaved off the skin from this area. Most recently, patient shaved off an area of skin around a week ago from the area and then started noticing poor healing and ulceration in the area. Patient also reports that he has had some fevers and chills at home associated with this as well. Patient also reports associated nausea and vomiting during the same. Though has intermittently been able to eat some meals. Patient states that he has not been taking his metformin for a long time now and has missed several doses of his insulin in the last couple days. Patient otherwise denies any chest pain, dyspnea, abdominal pain, dysuria, hematuria, constipation, or diarrhea.  Patient was originally evaluated in outpatient clinic and admitted directly to our service.  Hospital Course by problem list: Principal Problem:   Cellulitis and abscess of right foot Active Problems:   DM (diabetes mellitus) type I uncontrolled, periph vascular disorder   Morbid obesity   DEEP VENOUS THROMBOPHLEBITIS, HX OF   Foot ulcer, right   Essential hypertension   Hyperlipidemia   CKD stage 3 due to type 1 diabetes mellitus   GERD (gastroesophageal reflux disease)   Leukocytosis   Supratherapeutic INR   Abscess of right hip    Cellulitis and abscess   Diabetic ulcer of right foot   1. Diabetic foot ulcer with cellulitis, abscess: Patient presented with a 3-4 cm ulcer that has worsened within the last week prior to admission. Patient also reporting some fevers at home along with a fever of 100.4 in clinic. Patient was tachycardic though blood pressure was within normal limits. Leukocytosis of 15.3 with a left shift. There was no ability to probe to bone on physical exam. He was admitted to the general medical floor and started on IV Vancomycin and Zosyn. XR right foot 2 v 12/09/2013 with no foreign bodies, no evidence of erosion, no fx or dislocation. BCx from 12/09/2013 NGTD. MR R foot w/o contrast with skin ulceration on the plantar surface of the foot the level the fourth MTP joint with an underlying fluid collection compatible with abscess. Ortho consulted. He underwent Bedside I&D 12/12/2013. WBC initially downtrended but now up 16.5 --> 21.7. Wound culture grew MRSA. He continued to have fevers. ID consulted and recommended narrowing antibiotics. Ortho reevaluated R foot. Repeat MRI R foot on 12/14/2013 demonstrated abscess tracking in medial plantar subcutaneous tissues below the distal first and second metatarsals. He underwent repeat I&D 12/14/2013. Repeat wound cx had no growth. He developed abdominal pain and diarrhea and C. Diff PCR was found to be negative. CT abd pelvis with no acute findings. LFTs wnl except elevated Alk phos to 2 times the normal limit. GGT elevated indicating likely hepatic origin. RUQ [place] - mild GB contraction, GB filled with sludge and small stones, murphy's sign negative, borderline GB wall thickness of 3.69m, no focal liver lesions. Symptoms were thought to be secondary to ongoing infection in RLE. His Zosyn was discontinued on 12/17/2013. He remained on IV vanc. His leukocytosis continued to increase. He was taken to the OR on 12/22/2013 for R BKA as well as an I&D of a focal 749mfluid collection in  superficial subcutaneous fat seen on USKoreaf R posterior hip. Following the surgery, his leukocytosis improved as well as his PO  intake. His abdominal pain, N/V, and diarrhea also resolved. He will continue on vancomycin for 2 weeks post op (end date 01/05/2014). He can be transitioned to doxycycline if discharged prior to end date of antibiotics. His pain was controlled on Norco 10/345m 1-2 tab Q4hr. [facility]  2. Diabetic ketoacidosis secondary to type 2 diabetes, uncontrolled: Anion gap of 17 and a blood glucose of 574 at admission. Patient states that he has had DKA in the past. Patient had missed several doses of insulin in the last several days and not taken his metformin for several months. He was started on an insulin gtt with IV fluids. His anion gap decreased to 13 and 12 the following morning and his insulin gtt was discontinued. He was placed on SSI moderate and Novolog 70/30 25u BID. Novolog 70/30 was titrated eventually discontinued due to poor PO intake and low blood glucoses. He was started on Lantus when his blood glucoses started to increase again and was controlled on Lantus 35u QHS at transfer.  3. AKI on CKD stage 2: Cr trended upward from 2.36 to 2.41 (baseline around 1.6). He was continued on IV fluids and his lisinopril and Lasix were held. His creatinine decreased to 1.69 on 12/4. His creatinine increased again to a peak of 2.89 on 12/23/2013 when his PO intake became poor. His IV fluids were increased and his PO intake improved after his BKA. His creatinine then downtrended to 2.2 at transfer. Will need this rechecked in a few days as well as as outpatient. May need referral to nephrology eventually as outpatient.  4. Acute on chronic anemia: Hgb 11 at admission trended down to 6.7 on 12/15/2013. MCV wnl. He was transfused with 2u pRBC on 12/15/2013. Hgb up to 8.1 after pRBC transfusion. Febrile reaction during this transfusion 15 minutes  after initiation. No respiratory distress or hypotension making TRALI or TACO are unlikely. He had no hypotension, flank/back pain or bleeding to suggest acute hemolytic transfusion reaction. No hypotension, angioedema or itching to suggest anaphylaxis. Blood cx negative to date and no hypotension making sepsis less likely. His symptoms of fever without other symptoms makes febrile non-hemolytic reaction, post-op fever or fever due to current infection more likely. LDH elevated but haptoglobin elevated and indirect bilirubin wnl - making hemolysis less likely. Iron and TIBC low with elevated ferritin - consistent with anemia of chronic disease. Vitamin B12 and Folate wnl. He required two more transfusions of 2u pRBC each. The most recent was on 12/25/2013. At transfer his Hgb was stable from 8.4 to 8.1. May need eventual outpatient referral to nephrology and/or consideration of epo injection given renal insufficiency.  5. Hypertension: Blood pressure 144/70 in clinic. His home lisinopril/hydrochlorothiazide and Lasix 40 mg were held given above problem. His BP increased during his hospitalization and he was started on amlodipine 137mdaily and Coreg 3.125100mID. His home lisinopril/HCTZ and Lasix were continued to be held at transfer.  6. Recurrent DVTs on chronic Coumadin therapy: Continued warfarin. He received vitamin K injection on 12/21/2013 due to high INR and need to go to OR. His warfarin was restarted after his R BKA.  Discharge Vitals:   BP 154/88 mmHg  Pulse 103  Temp(Src) 99.2 F (37.3 C) (Oral)  Resp 18  Ht _0  (1.88 m)  Wt 385 lb 3.2 oz (174.726 kg)  BMI 49.44 kg/m2  SpO2 100%  Discharge Labs:  Results for orders placed or performed during the hospital encounter  of 12/09/13 (from the past 24 hour(s))  Glucose, capillary     Status: Abnormal   Collection Time: 12/25/13  2:49 PM  Result Value Ref Range   Glucose-Capillary 132 (H) 70 - 99 mg/dL  Glucose, capillary     Status:  Abnormal   Collection Time: 12/25/13  5:53 PM  Result Value Ref Range   Glucose-Capillary 113 (H) 70 - 99 mg/dL  Glucose, capillary     Status: Abnormal   Collection Time: 12/25/13  9:47 PM  Result Value Ref Range   Glucose-Capillary 138 (H) 70 - 99 mg/dL   Comment 1 Notify RN    Comment 2 Documented in Chart   CBC     Status: Abnormal   Collection Time: 12/26/13  1:03 AM  Result Value Ref Range   WBC 13.1 (H) 4.0 - 10.5 K/uL   RBC 3.03 (L) 4.22 - 5.81 MIL/uL   Hemoglobin 8.4 (L) 13.0 - 17.0 g/dL   HCT 26.3 (L) 39.0 - 52.0 %   MCV 86.8 78.0 - 100.0 fL   MCH 27.7 26.0 - 34.0 pg   MCHC 31.9 30.0 - 36.0 g/dL   RDW 15.9 (H) 11.5 - 15.5 %   Platelets 387 150 - 400 K/uL  Protime-INR     Status: Abnormal   Collection Time: 12/26/13  4:39 AM  Result Value Ref Range   Prothrombin Time 17.0 (H) 11.6 - 15.2 seconds   INR 1.37 0.00 - 1.49  CBC with Differential     Status: Abnormal   Collection Time: 12/26/13  4:39 AM  Result Value Ref Range   WBC 12.5 (H) 4.0 - 10.5 K/uL   RBC 3.00 (L) 4.22 - 5.81 MIL/uL   Hemoglobin 8.1 (L) 13.0 - 17.0 g/dL   HCT 25.6 (L) 39.0 - 52.0 %   MCV 85.3 78.0 - 100.0 fL   MCH 27.0 26.0 - 34.0 pg   MCHC 31.6 30.0 - 36.0 g/dL   RDW 15.9 (H) 11.5 - 15.5 %   Platelets 406 (H) 150 - 400 K/uL   Neutrophils Relative % 79 (H) 43 - 77 %   Neutro Abs 9.8 (H) 1.7 - 7.7 K/uL   Lymphocytes Relative 11 (L) 12 - 46 %   Lymphs Abs 1.4 0.7 - 4.0 K/uL   Monocytes Relative 8 3 - 12 %   Monocytes Absolute 1.0 0.1 - 1.0 K/uL   Eosinophils Relative 2 0 - 5 %   Eosinophils Absolute 0.2 0.0 - 0.7 K/uL   Basophils Relative 0 0 - 1 %   Basophils Absolute 0.0 0.0 - 0.1 K/uL  Basic metabolic panel     Status: Abnormal   Collection Time: 12/26/13  4:39 AM  Result Value Ref Range   Sodium 139 137 - 147 mEq/L   Potassium 4.9 3.7 - 5.3 mEq/L   Chloride 109 96 - 112 mEq/L   CO2 20 19 - 32 mEq/L   Glucose, Bld 117 (H) 70 - 99 mg/dL   BUN 32 (H) 6 - 23 mg/dL   Creatinine, Ser  2.20 (H) 0.50 - 1.35 mg/dL   Calcium 8.2 (L) 8.4 - 10.5 mg/dL   GFR calc non Af Amer 36 (L) >90 mL/min   GFR calc Af Amer 42 (L) >90 mL/min   Anion gap 10 5 - 15  Glucose, capillary     Status: None   Collection Time: 12/26/13  7:56 AM  Result Value Ref Range   Glucose-Capillary 99 70 - 99 mg/dL  Signed: Jacques Earthly, MD 12/26/2013, 11:26 AM    Services Ordered on Discharge: None Equipment Ordered on Discharge: None

## 2013-12-26 NOTE — H&P (View-Only) (Signed)
Physical Medicine and Rehabilitation Admission H&P    CC: R-BKA due to foot abscess with wet gangrene  HPI: [name] is a 38 y.o. male with history of DM type 2 with diabetic foot ulcer, morbid obesity, HTN, CKD, on chronic coumadin for recurrent DVT/PE, medical non-compliance; who was admitted via MD office on 12/02 with fever chills, N/V and right foot pain. He was started on IV antibiotics for cellulitis right foot as well as IVF for AKI on CKD. MRI of right foot revealed skin ulcer on plantar surface 4th MTP with underlying abscess but no osteo or septic joint. Dr. Doran Durand was consulted for input and performed I and D at bedside with cultures. Cultures positive for MRSA. Patient developed recurrent fever with chills on 12/05 with progressive leucocytosis. Repeat MRI right foot on 12/06 revealed new abscess tracking medial plantar surface below 1st and 2nd MT with extensive edema due to cellulitis as well as myositis. He was taken to OR on 12/7 for I and D by Dr. Veverly Fells. Antibiotics narrowed to vancomycin. Patient continued to have abdominal discomfort with N/V. Stool negative for C diff. CT abdomen/pelvis with atelectasis but no signs of infection. Patient with complaints of right hip pain and ultrasound with superficial abscess. Pain has improved but he continued to have persistent leucocytosis and ID consulted for input. Dr. Megan Salon recommended I and D of superficial right buttock abscess as well as continuing Vancomycin alone.   Patient continued to have poor wound healing with wet gangrene and failed attempts at limb salvage. He underwent R-BKA with I and D right buttock abscess by Dr. Doran Durand on 12/22/13.  He has had issues with depressed mood and difficulty dealing with amputation.  ID recommends 14 days Vanc post op to treat buttock wound.  Bio Tech consulted for [employer] device for limb protection.  CBC done to day revealing drop in Hgb to 6.5 and patient to be transfused with 2 units  PRBCs. Therapy ongoing with and patient limited by pain and weakness but is showing improvement in participation with activity. CIR was recommended by MD and rehab team and patient admitted today.   ROS    Past Medical History  Diagnosis Date  . DVT (deep venous thrombosis) 09/2002    patient reports additional DVTs in '06 & '11 (unconfirmed)  . Pulmonary embolism 09/2002    treated with 6 months of warfarin  . Hypertension   . Chest pain, neg MI, normal coronaries by cath 02/18/2013  . Hyperlipidemia 02/19/2013  . Acute venous embolism and thrombosis of deep vessels of proximal lower extremity 07/19/2011  . Type I diabetes mellitus dx'd 2001  . GERD (gastroesophageal reflux disease)   . Diabetic ulcer of right foot   . CKD (chronic kidney disease) stage 3, GFR 30-59 ml/min 02/19/2013  . Nephrotic syndrome     Past Surgical History  Procedure Laterality Date  . Incision and drainage abscess  2007; 2015    "back"  . Cardiac catheterization  02/18/2013    normal coronaries  . I&d extremity Right 12/14/2013    Procedure: IRRIGATION AND DEBRIDEMENT RIGHT FOOT;  Surgeon: [name], MD;  Location: [place];  Service: Orthopedics;  Laterality: Right;  . Left heart catheterization with coronary angiogram N/A 02/18/2013    Procedure: LEFT HEART CATHETERIZATION WITH CORONARY ANGIOGRAM;  Surgeon: [name], MD;  Location: [facility] CATH LAB;  Service: Cardiovascular;  Laterality: N/A;  . Amputation Right 12/22/2013    Procedure: AMPUTATION BELOW KNEE;  Surgeon: [name], MD;  Location: [place];  Service: Orthopedics;  Laterality: Right;  . Incision and drainage of wound Right 12/22/2013    Procedure: I&D RIGHT BUTTOCK;  Surgeon: [name], MD;  Location: [place];  Service: Orthopedics;  Laterality: Right;    Family History  Problem Relation Age of Onset  . Heart disease    . Diabetes    . Obesity      Social History:  Single. Unemployed for past few months. Lives with mother. Independent  without AD. He reports that he has never smoked. He has never used smokeless tobacco. He reports that he drinks alcohol. He reports that he does not use illicit drugs.    Allergies: No Known Allergies    Medications Prior to Admission  Medication Sig Dispense Refill  . furosemide (LASIX) 40 MG tablet Take 1 tablet (40 mg total) by mouth daily. 30 tablet   . insulin NPH-regular Human (NOVOLIN 70/30) (70-30) 100 UNIT/ML injection Inject 35-50 Units into the skin 2 (two) times daily with a meal. 50 units in the morning and 45 units in the evening (Patient taking differently: Inject 45-50 Units into the skin 2 (two) times daily with a meal. 50 units in the morning and 45 units in the evening) 10 mL 11  . lisinopril-hydrochlorothiazide (PRINZIDE,ZESTORETIC) 20-25 MG per tablet Take 1 tablet by mouth daily. 30 tablet 6  . lovastatin (MEVACOR) 20 MG tablet Take 1 tablet (20 mg total) by mouth daily. 30 tablet 11  . metFORMIN (GLUCOPHAGE) 500 MG tablet Take 2 tablets (1,000 mg total) by mouth 2 (two) times daily with a meal. 120 tablet 6  . ranitidine (ZANTAC) 300 MG tablet Take 1 tablet (300 mg total) by mouth at bedtime. 30 tablet 3  . warfarin (COUMADIN) 5 MG tablet Take 1.5 tablets (7.5 mg) all days of week EXCEPT on Mondays and Thursdays take 2 tablets. 50 tablet 1    Home: Home Living Family/patient expects to be discharged to:: Inpatient rehab Living Arrangements: Spouse/significant other, Parent Available Help at Discharge: Family Type of Home: House (townhouse at mothers) Home Access: Stairs to enter Technical brewer of Steps: 4 steps to enter at girlfriend's  Entrance Stairs-Rails: Right Home Layout: One level Alternate Level Stairs-Number of Steps: two level at mother's with 12 steps to bedrooms Alternate Level Stairs-Rails: Left Home Equipment: None   Functional History: Prior Function Level of Independence: Independent Comments: Independent prior to admission on  12/09/13.  Functional Status:  Mobility: Bed Mobility Overal bed mobility: Needs Assistance, +2 for physical assistance Bed Mobility: Sit to Supine Supine to sit: Mod assist Sit to supine: Supervision General bed mobility comments: Received sitting in chair upon PT arrival. Mod A to assist with BLEs to return to bed. Transfers Overall transfer level: Needs assistance Equipment used: None Transfers: Sit to/from Stand Sit to Stand: +2 physical assistance, Total assist, Max assist Stand pivot transfers: Min assist General transfer comment: Required multiple attempts to stand due to weakness/fatigue and pain in right stump. Stood from Psychologist, occupational. Total A of 2 with cues for technique and use of forward momentum to assist with elevating hips. Stood from [employer] Max A of 2 (higher surface) with cues for UE extension. Ambulation/Gait Ambulation/Gait assistance: Min assist, Mod assist Ambulation Distance (Feet): 40 Feet Assistive device: Rolling walker (2 wheeled) Gait Pattern/deviations: Step-to pattern, Step-through pattern, Decreased stance time - right, Decreased stride length, Antalgic, Wide base of support Gait velocity: decreased General Gait Details: Pt requires max  verbal cues at times for safety, as he tends to let RW get too far ahead of him and demonstrates impulsivity when getting back into room to recliner.  Poor safety awareness when attempting to negotiate around obstacles in room.     ADL: ADL Overall ADL's : Needs assistance/impaired Eating/Feeding: Independent, Sitting Grooming: Oral care, Set up, Sitting Upper Body Bathing: Supervision/ safety, Sitting Lower Body Bathing: Maximal assistance, +2 for physical assistance, Sit to/from stand (+3 for safety) Upper Body Dressing : Supervision/safety, Bed level Lower Body Dressing: Total assistance, +2 for physical assistance, Sit to/from stand (+3 for safety) Toilet Transfer: Maximal assistance, +2 for physical  assistance Toilet Transfer Details (indicate cue type and reason): pt performed sit>stand from bed using bariatric steady and transfered to recliner chair with +3 max (A) Toileting- Clothing Manipulation and Hygiene: Minimal assistance, Sit to/from stand Tub/ Shower Transfer: Minimal assistance, Ambulation, Rolling walker Tub/Shower Transfer Details (indicate cue type and reason): simulated stepping over edge of tum with use of the RW for support and simulated tub wall. Functional mobility during ADLs: Minimal assistance, Rolling walker General ADL Comments: Pt repeatedly mentioned difficulty coping with BKA. Pt is limited by LLE weakness and pain which impair his independence.   Cognition: Cognition Overall Cognitive Status: Within Functional Limits for tasks assessed Orientation Level: Oriented X4 Cognition Arousal/Alertness: Awake/alert Behavior During Therapy: WFL for tasks assessed/performed Overall Cognitive Status: Within Functional Limits for tasks assessed  Physical Exam: Blood pressure 154/88, pulse 103, temperature 99.2 F (37.3 C), temperature source Oral, resp. rate 18, height _0  (1.88 m), weight 174.726 kg (385 lb 3.2 oz), SpO2 100 %. Physical Exam  Constitutional: He appears well-developed and well-nourished.  HENT:  Head: Normocephalic and atraumatic.  Eyes: Conjunctivae and EOM are normal. Pupils are equal, round, and reactive to light.  Neck: No tracheal deviation present. No thyromegaly present.  Cardiovascular: Normal rate and normal heart sounds.  Exam reveals no friction rub.   No murmur heard. Respiratory: No respiratory distress. He has no wheezes.  GI: He exhibits no distension. There is no tenderness.  Musculoskeletal: He exhibits edema (1+ LLE).  Right leg dressed and in hard knee extension sleeve  Neurological: No cranial nerve deficit. Coordination normal.  Psychiatric: He has a normal mood and affect. His behavior is normal.    Results for orders  placed or performed during the hospital encounter of 12/09/13 (from the past 48 hour(s))  Glucose, capillary     Status: Abnormal   Collection Time: 12/24/13 11:47 AM  Result Value Ref Range   Glucose-Capillary 153 (H) 70 - 99 mg/dL  Glucose, capillary     Status: Abnormal   Collection Time: 12/24/13  5:35 PM  Result Value Ref Range   Glucose-Capillary 159 (H) 70 - 99 mg/dL  Glucose, capillary     Status: Abnormal   Collection Time: 12/24/13  9:10 PM  Result Value Ref Range   Glucose-Capillary 147 (H) 70 - 99 mg/dL  Protime-INR     Status: Abnormal   Collection Time: 12/25/13  5:19 AM  Result Value Ref Range   Prothrombin Time 16.4 (H) 11.6 - 15.2 seconds   INR 1.31 0.00 - 1.49  CBC with Differential     Status: Abnormal   Collection Time: 12/25/13  5:19 AM  Result Value Ref Range   WBC 13.8 (H) 4.0 - 10.5 K/uL   RBC 2.44 (L) 4.22 - 5.81 MIL/uL   Hemoglobin 6.9 (LL) 13.0 - 17.0 g/dL    Comment:  REPEATED TO VERIFY CRITICAL RESULT CALLED TO, READ BACK BY AND VERIFIED WITH: CROSS,T RN @ [phone] 12/25/13 LEONARD,A    HCT 21.3 (L) 39.0 - 52.0 %   MCV 87.3 78.0 - 100.0 fL   MCH 28.3 26.0 - 34.0 pg   MCHC 32.4 30.0 - 36.0 g/dL   RDW 16.3 (H) 11.5 - 15.5 %   Platelets 435 (H) 150 - 400 K/uL   Neutrophils Relative % 79 (H) 43 - 77 %   Lymphocytes Relative 11 (L) 12 - 46 %   Monocytes Relative 8 3 - 12 %   Eosinophils Relative 2 0 - 5 %   Basophils Relative 0 0 - 1 %   Neutro Abs 10.9 (H) 1.7 - 7.7 K/uL   Lymphs Abs 1.5 0.7 - 4.0 K/uL   Monocytes Absolute 1.1 (H) 0.1 - 1.0 K/uL   Eosinophils Absolute 0.3 0.0 - 0.7 K/uL   Basophils Absolute 0.0 0.0 - 0.1 K/uL   RBC Morphology POLYCHROMASIA PRESENT    WBC Morphology ATYPICAL LYMPHOCYTES   Basic metabolic panel     Status: Abnormal   Collection Time: 12/25/13  5:19 AM  Result Value Ref Range   Sodium 138 137 - 147 mEq/L   Potassium 4.6 3.7 - 5.3 mEq/L   Chloride 109 96 - 112 mEq/L   CO2 18 (L) 19 - 32 mEq/L   Glucose, Bld 95 70 -  99 mg/dL   BUN 37 (H) 6 - 23 mg/dL   Creatinine, Ser 2.24 (H) 0.50 - 1.35 mg/dL   Calcium 8.0 (L) 8.4 - 10.5 mg/dL   GFR calc non Af Amer 36 (L) >90 mL/min   GFR calc Af Amer 41 (L) >90 mL/min    Comment: (NOTE) The eGFR has been calculated using the CKD EPI equation. This calculation has not been validated in all clinical situations. eGFR's persistently <90 mL/min signify possible Chronic Kidney Disease.    Anion gap 11 5 - 15  Glucose, capillary     Status: None   Collection Time: 12/25/13  7:32 AM  Result Value Ref Range   Glucose-Capillary 93 70 - 99 mg/dL  Hemoglobin and hematocrit, blood     Status: Abnormal   Collection Time: 12/25/13  8:00 AM  Result Value Ref Range   Hemoglobin 6.5 (LL) 13.0 - 17.0 g/dL    Comment: CRITICAL VALUE NOTED.  VALUE IS CONSISTENT WITH PREVIOUSLY REPORTED AND CALLED VALUE.   HCT 20.8 (L) 39.0 - 52.0 %  Type and screen     Status: None   Collection Time: 12/25/13  8:45 AM  Result Value Ref Range   ABO/RH(D) B POS    Antibody Screen NEG    Sample Expiration 12/28/2013    Unit Number [id number]    Blood Component Type RED CELLS,LR    Unit division 00    Status of Unit ISSUED,FINAL    Transfusion Status OK TO TRANSFUSE    Crossmatch Result Compatible    Unit Number [id number]    Blood Component Type RED CELLS,LR    Unit division 00    Status of Unit ISSUED,FINAL    Transfusion Status OK TO TRANSFUSE    Crossmatch Result Compatible   Prepare RBC     Status: None   Collection Time: 12/25/13 10:59 AM  Result Value Ref Range   Order Confirmation ORDER PROCESSED BY BLOOD BANK   Glucose, capillary     Status: Abnormal   Collection Time: 12/25/13  2:49 PM  Result  Value Ref Range   Glucose-Capillary 132 (H) 70 - 99 mg/dL  Glucose, capillary     Status: Abnormal   Collection Time: 12/25/13  5:53 PM  Result Value Ref Range   Glucose-Capillary 113 (H) 70 - 99 mg/dL  Glucose, capillary     Status: Abnormal   Collection Time: 12/25/13   9:47 PM  Result Value Ref Range   Glucose-Capillary 138 (H) 70 - 99 mg/dL   Comment 1 Notify RN    Comment 2 Documented in Chart   CBC     Status: Abnormal   Collection Time: 12/26/13  1:03 AM  Result Value Ref Range   WBC 13.1 (H) 4.0 - 10.5 K/uL   RBC 3.03 (L) 4.22 - 5.81 MIL/uL   Hemoglobin 8.4 (L) 13.0 - 17.0 g/dL    Comment: POST TRANSFUSION SPECIMEN   HCT 26.3 (L) 39.0 - 52.0 %   MCV 86.8 78.0 - 100.0 fL   MCH 27.7 26.0 - 34.0 pg   MCHC 31.9 30.0 - 36.0 g/dL   RDW 15.9 (H) 11.5 - 15.5 %   Platelets 387 150 - 400 K/uL  Protime-INR     Status: Abnormal   Collection Time: 12/26/13  4:39 AM  Result Value Ref Range   Prothrombin Time 17.0 (H) 11.6 - 15.2 seconds   INR 1.37 0.00 - 1.49  CBC with Differential     Status: Abnormal   Collection Time: 12/26/13  4:39 AM  Result Value Ref Range   WBC 12.5 (H) 4.0 - 10.5 K/uL   RBC 3.00 (L) 4.22 - 5.81 MIL/uL   Hemoglobin 8.1 (L) 13.0 - 17.0 g/dL   HCT 25.6 (L) 39.0 - 52.0 %   MCV 85.3 78.0 - 100.0 fL   MCH 27.0 26.0 - 34.0 pg   MCHC 31.6 30.0 - 36.0 g/dL   RDW 15.9 (H) 11.5 - 15.5 %   Platelets 406 (H) 150 - 400 K/uL   Neutrophils Relative % 79 (H) 43 - 77 %   Neutro Abs 9.8 (H) 1.7 - 7.7 K/uL   Lymphocytes Relative 11 (L) 12 - 46 %   Lymphs Abs 1.4 0.7 - 4.0 K/uL   Monocytes Relative 8 3 - 12 %   Monocytes Absolute 1.0 0.1 - 1.0 K/uL   Eosinophils Relative 2 0 - 5 %   Eosinophils Absolute 0.2 0.0 - 0.7 K/uL   Basophils Relative 0 0 - 1 %   Basophils Absolute 0.0 0.0 - 0.1 K/uL  Basic metabolic panel     Status: Abnormal   Collection Time: 12/26/13  4:39 AM  Result Value Ref Range   Sodium 139 137 - 147 mEq/L   Potassium 4.9 3.7 - 5.3 mEq/L   Chloride 109 96 - 112 mEq/L   CO2 20 19 - 32 mEq/L   Glucose, Bld 117 (H) 70 - 99 mg/dL   BUN 32 (H) 6 - 23 mg/dL   Creatinine, Ser 2.20 (H) 0.50 - 1.35 mg/dL   Calcium 8.2 (L) 8.4 - 10.5 mg/dL   GFR calc non Af Amer 36 (L) >90 mL/min   GFR calc Af Amer 42 (L) >90 mL/min     Comment: (NOTE) The eGFR has been calculated using the CKD EPI equation. This calculation has not been validated in all clinical situations. eGFR's persistently <90 mL/min signify possible Chronic Kidney Disease.    Anion gap 10 5 - 15  Glucose, capillary     Status: None   Collection Time: 12/26/13  7:56 AM  Result Value Ref Range   Glucose-Capillary 99 70 - 99 mg/dL  Glucose, capillary     Status: None   Collection Time: 12/26/13 10:36 AM  Result Value Ref Range   Glucose-Capillary 92 70 - 99 mg/dL   Comment 1 Notify RN    No results found.     Medical Problem List and Plan: 1. Functional deficits secondary to L-BKA 2.  DVT Prophylaxis/Anticoagulation: Pharmaceutical: Lovenox 3. Pain Management: Premedicate prior to therapy sessions. Continues to require IV dilaudid in addition to hydrocodone --will escalate to oxycodone prn and d/c dilaudid.  4. Mood: team to provide ego support. Will add lexapro for mood stabilization. LCSW to follow for evaluation and support. Will need psych follow up also.  5. Neuropsych: This patient is capable of making decisions on his own behalf. 6. Skin/Wound Care: Daily dressing changes to left buttock wound. Routine pressure relief measures. Monitor wound daily for healing. Maintain adequate nutrition. .  7. Fluids/Electrolytes/Nutrition: Monitor I/O.  Add protein supplements to help with low protein stores.  8. Reactive leucocytosis: Resolving. Recheck in am. Monitor wound and temperature curve daily.  9. DM type 2: Po intake remains poor.  On Lantus to prevent hypoglycemic episodes. Will need to be changed to 70/30 prior to d/c home due to financial constraints. Will monitor BS with ac/hs checks and titrate insulin as indicated. Reinforce importance of medication compliance.  10. CKD:  Monitor renal status closely as on IV Vancomycin. Avoid other nephrotoxic drugs. Will recheck lytes in am.  11. HTN: Monitor BP tid on norvasc. Off prinizide and lasix  due to AKI on CKD. Baseline Cr- 1.78 but is stabilizing to 2.3 range.  12. Acute on chronic anemia: Low iron stores with high ferritin levels. Likely poor absorption.   Transfused with 2 units on 12/17.  Recheck labs on Monday.  13. Left buttock wound: S/p I & D 12/14--antibiotic Day # 4/14  14. Resting tachycardia: HR in 110 range. Multifactorial due to anemia as well as deconditioning. Will monitor for symptoms with activity. May need low dose BB.    Post Admission Physician Evaluation: 1. Functional deficits secondary  to left BKA. 2. Patient is admitted to receive collaborative, interdisciplinary care between the physiatrist, rehab nursing staff, and therapy team. 3. Patient's level of medical complexity and substantial therapy needs in context of that medical necessity cannot be provided at a lesser intensity of care such as a SNF. 4. Patient has experienced substantial functional loss from his/her baseline which was documented above under the "Functional History" and "Functional Status" headings.  Judging by the patient's diagnosis, physical exam, and functional history, the patient has potential for functional progress which will result in measurable gains while on inpatient rehab.  These gains will be of substantial and practical use upon discharge  in facilitating mobility and self-care at the household level. 5. Physiatrist will provide 24 hour management of medical needs as well as oversight of the therapy plan/treatment and provide guidance as appropriate regarding the interaction of the two. 6. 24 hour rehab nursing will assist with bladder management, bowel management, safety, skin/wound care, disease management, medication administration, pain management and patient education  and help integrate therapy concepts, techniques,education, etc. 7. PT will assess and treat for/with: Lower extremity strength, range of motion, stamina, balance, functional mobility, safety, adaptive techniques and  equipment, pain mgt, pre-prosthetic ed.   Goals are: mod I. 8. OT will assess and treat for/with: ADL's, functional mobility, safety, upper extremity  strength, adaptive techniques and equipment, pain mgt, egosupport.   Goals are: mod I. Therapy may  proceed with showering this patient if stump is covered and water-proofed. 9. SLP will assess and treat for/with: n/a.  Goals are: n/a. 10. Case Management and Social Worker will assess and treat for psychological issues and discharge planning. 11. Team conference will be held weekly to assess progress toward goals and to determine barriers to discharge. 12. Patient will receive at least 3 hours of therapy per day at least 5 days per week. 13. ELOS: 7-9 days       14. Prognosis:  excellent     Meredith Staggers, MD, [place] [facility] 12/26/2013   12/26/2013

## 2013-12-26 NOTE — Progress Notes (Signed)
Report called to Marcie Bal on 4 [facility] inpatient rehab.

## 2013-12-26 NOTE — Progress Notes (Signed)
PT Cancellation Note  Patient Details Name: [name] MRN: [id number] DOB: 1975-08-01   Cancelled Treatment:    Reason Eval/Treat Not Completed: Other (comment) (Nursing is finishing pt care to transport to CIR, declined h)im for PT.   Ramond Dial 12/26/2013, 2:49 PM   Mee Hives, PT MS Acute Rehab Dept. Number: [id number]

## 2013-12-27 ENCOUNTER — Inpatient Hospital Stay (HOSPITAL_COMMUNITY): Payer: Medicaid Other | Admitting: Physical Therapy

## 2013-12-27 ENCOUNTER — Inpatient Hospital Stay (HOSPITAL_COMMUNITY): Payer: Medicaid Other | Admitting: Occupational Therapy

## 2013-12-27 DIAGNOSIS — Z89511 Acquired absence of right leg below knee: Secondary | ICD-10-CM

## 2013-12-27 LAB — VANCOMYCIN, TROUGH: Vancomycin Tr: 19.9 ug/mL (ref 10.0–20.0)

## 2013-12-27 LAB — GLUCOSE, CAPILLARY
GLUCOSE-CAPILLARY: 142 mg/dL — AB (ref 70–99)
GLUCOSE-CAPILLARY: 141 mg/dL — AB (ref 70–99)
Glucose-Capillary: 118 mg/dL — ABNORMAL HIGH (ref 70–99)
Glucose-Capillary: 134 mg/dL — ABNORMAL HIGH (ref 70–99)

## 2013-12-27 LAB — ANAEROBIC CULTURE

## 2013-12-27 MED ORDER — ONDANSETRON HCL 4 MG PO TABS
4.0000 mg | ORAL_TABLET | Freq: Four times a day (QID) | ORAL | Status: DC | PRN
  Administered 2013-12-27 – 2013-12-28 (×3): 4 mg via ORAL
  Filled 2013-12-27 (×3): qty 1

## 2013-12-27 MED ORDER — ONDANSETRON HCL 4 MG/2ML IJ SOLN
4.0000 mg | Freq: Four times a day (QID) | INTRAMUSCULAR | Status: DC | PRN
  Administered 2013-12-28 – 2013-12-29 (×3): 4 mg via INTRAVENOUS
  Filled 2013-12-27 (×3): qty 2

## 2013-12-27 MED ORDER — VANCOMYCIN HCL 10 G IV SOLR
1250.0000 mg | INTRAVENOUS | Status: DC
  Administered 2013-12-27 – 2013-12-30 (×4): 1250 mg via INTRAVENOUS
  Filled 2013-12-27 (×5): qty 1250

## 2013-12-27 MED ORDER — WARFARIN SODIUM 5 MG PO TABS
5.0000 mg | ORAL_TABLET | Freq: Once | ORAL | Status: AC
  Administered 2013-12-27: 5 mg via ORAL
  Filled 2013-12-27: qty 1

## 2013-12-27 NOTE — Evaluation (Signed)
Occupational Therapy Assessment and Plan  Patient Details  Name: Delron Comer MRN: [id number] Date of Birth: 06-24-1975  OT Diagnosis: acute pain and muscle weakness (generalized) Rehab Potential:   ELOS: 10-12 days   Today's Date: 12/27/2013 OT Individual Time: 7341-9379 OT Individual Time Calculation (min): 60 min     Problem List:  Patient Active Problem List   Diagnosis Date Noted  . Amputation of right lower extremity below knee 12/26/2013  . Diabetic ulcer of right foot   . Cellulitis and abscess   . Abscess of right hip 12/20/2013  . Leukocytosis 12/18/2013  . Diarrhea 12/18/2013  . Supratherapeutic INR 12/18/2013  . Hypoglycemia 12/17/2013  . Cellulitis and abscess of right foot 12/12/2013  . Sepsis 12/12/2013  . Severe obesity (BMI >= 40) 08/14/2013  . GERD (gastroesophageal reflux disease) 08/14/2013  . Hyperlipidemia 02/19/2013  . S/P cardiac cath, 02/18/13, normal coronaries 02/19/2013  . CKD stage 3 due to type 1 diabetes mellitus 02/19/2013  . Chest pain, neg MI, normal coronaries by cath 02/18/2013  . Bilateral leg edema 12/24/2012  . Swelling of thumb, left 10/02/2012  . Failure to attend appointment 06/10/2012  . Nephrotic syndrome 02/12/2012  . Right foot pain 02/09/2012  . Essential hypertension 07/24/2011  . Acute venous embolism and thrombosis of deep vessels of proximal lower extremity 07/19/2011  . Long term (current) use of anticoagulants 07/19/2011  . Foot ulcer, right 07/12/2011  . Back pain 07/12/2011  . DEEP VENOUS THROMBOPHLEBITIS, HX OF 07/28/2009  . PROTEINURIA, MILD 02/01/2007  . Morbid obesity 10/26/2005  . DM (diabetes mellitus) type I uncontrolled, periph vascular disorder 01/09/2002    Past Medical History:  Past Medical History  Diagnosis Date  . DVT (deep venous thrombosis) 09/2002    patient reports additional DVTs in '06 & '11 (unconfirmed)  . Pulmonary embolism 09/2002    treated with 6 months of warfarin  . Hypertension    . Chest pain, neg MI, normal coronaries by cath 02/18/2013  . Hyperlipidemia 02/19/2013  . Acute venous embolism and thrombosis of deep vessels of proximal lower extremity 07/19/2011  . Type I diabetes mellitus dx'd 2001  . GERD (gastroesophageal reflux disease)   . Diabetic ulcer of right foot   . CKD (chronic kidney disease) stage 3, GFR 30-59 ml/min 02/19/2013  . Nephrotic syndrome    Past Surgical History:  Past Surgical History  Procedure Laterality Date  . Incision and drainage abscess  2007; 2015    "back"  . Cardiac catheterization  02/18/2013    normal coronaries  . I&d extremity Right 12/14/2013    Procedure: IRRIGATION AND DEBRIDEMENT RIGHT FOOT;  Surgeon: [name], MD;  Location: [facility];  Service: Orthopedics;  Laterality: Right;  . Left heart catheterization with coronary angiogram N/A 02/18/2013    Procedure: LEFT HEART CATHETERIZATION WITH CORONARY ANGIOGRAM;  Surgeon: [name], MD;  Location: [facility] CATH LAB;  Service: Cardiovascular;  Laterality: N/A;  . Amputation Right 12/22/2013    Procedure: AMPUTATION BELOW KNEE;  Surgeon: [name], MD;  Location: [place];  Service: Orthopedics;  Laterality: Right;  . Incision and drainage of wound Right 12/22/2013    Procedure: I&D RIGHT BUTTOCK;  Surgeon: [name], MD;  Location: [place];  Service: Orthopedics;  Laterality: Right;    Assessment & Plan Clinical Impression: Patient is a 38 y.o. male DM type 2 with diabetic foot ulcer, morbid obesity, HTN, CKD, on chronic coumadin for recurrent DVT/PE, medical non-compliance; who was admitted via  MD office on 12/02 with fever chills, N/V and right foot pain. He was started on IV antibiotics for cellulitis right foot as well as IVF for AKI on CKD. MRI of right foot revealed skin ulcer on plantar surface 4th MTP with underlying abscess but no osteo or septic joint. Dr. Doran Durand was consulted for input and performed I and D at bedside with cultures. Cultures positive for MRSA. Patient  developed recurrent fever with chills on 12/05 with progressive leucocytosis. Repeat MRI right foot on 12/06 revealed new abscess tracking medial plantar surface below 1st and 2nd MT with extensive edema due to cellulitis as well as myositis. He was taken to OR on 12/7 for I and D by Dr. Veverly Fells. Antibiotics narrowed to vancomycin. Patient continued to have abdominal discomfort with N/V. Stool negative for C diff. CT abdomen/pelvis with atelectasis but no signs of infection. Patient with complaints of right hip pain and ultrasound with superficial abscess. Pain has improved but he continued to have persistent leucocytosis and ID consulted for input. Dr. Megan Salon recommended I and D of superficial right buttock abscess as well as continuing Vancomycin alone.   Patient continued to have poor wound healing with wet gangrene and failed attempts at limb salvage. He underwent R-BKA with I and D right buttock abscess by Dr. Doran Durand on 12/22/13. He has had issues with depressed mood and difficulty dealing with amputation. ID recommends 14 days Vanc post op to treat buttock wound. Bio Tech consulted for [employer] device for limb protection. CBC done to day revealing drop in Hgb to 6.5 and patient to be transfused with 2 units PRBCs. Therapy ongoing with and patient limited by pain and weakness but is showing improvement in participation with activity. CIR was recommended by MD and rehab team and patient admitted today.   Patient transferred to CIR on 12/26/2013 .    Patient currently requires min with basic self-care skills secondary to muscle weakness and decreased balance strategies and nausea/vomiting.  Prior to hospitalization, patient could complete ADLs with independent .  Patient will benefit from skilled intervention to increase independence with basic self-care skills and increase level of independence with iADL prior to discharge home with care partner.  Anticipate patient will require intermittent  supervision and follow up home health.  OT - End of Session Activity Tolerance: Tolerates <10 min activity, no significant change in vital signs Endurance Deficit: Yes Endurance Deficit Description: nausea and vomiting on eval, required multiple rest breaks OT Assessment Barriers to Discharge: Inaccessible home environment;Decreased caregiver support Barriers to Discharge Comments: mom's home has full flight of stairs to get to bedroom and full bathroom, may stay with girlfriend whose home has between 4 and 8 steps to enter (varying reports).  Mom works during day and unsure of girlfriend OT Patient demonstrates impairments in the following area(s): Balance;Endurance;Motor;Pain;Safety;Skin Integrity OT Basic ADL's Functional Problem(s): Grooming;Bathing;Dressing;Toileting OT Advanced ADL's Functional Problem(s): Simple Meal Preparation OT Transfers Functional Problem(s): Toilet;Tub/Shower OT Plan OT Intensity: Minimum of 1-2 x/day, 45 to 90 minutes OT Frequency: 5 out of 7 days OT Duration/Estimated Length of Stay: 10-12 days OT Treatment/Interventions: Balance/vestibular training;Discharge planning;Disease Lawyer;Functional mobility training;Pain management;Patient/family education;Psychosocial support;Self Care/advanced ADL retraining;Skin care/wound managment;Therapeutic Activities;Therapeutic Exercise;UE/LE Strength taining/ROM OT Basic Self-Care Anticipated Outcome(s): Mod I OT Toileting Anticipated Outcome(s): Mod I OT Bathroom Transfers Anticipated Outcome(s): Mod I OT Recommendation Recommendations for Other Services: Neuropsych consult Patient destination: Home Follow Up Recommendations: Home health OT (TBD) Equipment Recommended: 3 in 1 bedside comode;Tub/shower bench Equipment  Details: will require BSC if going home to mom's house, TBD based on d/c plan   Skilled Therapeutic Intervention OT evaluation initiated with education  of rehab process, purpose of OT, and potential goals.  Pt's participation in treatment session limited by nausea and vomiting. Pt dressed upon arrival and unwilling to complete bathing and dressing, reports donning pants in bed without assist with rolling side to side in bed. Pt required multiple rest breaks to even complete bed mobility up to seated EOB.  Min assist with mobility and heavy reliance on bed rails.  Pt completed grooming tasks seated at EOB with setup assist.   Encouraged pt to sit up in w/c or recliner with pt requesting to remain in bed.  Notified RN of nausea/vomiting and pain.  OT Evaluation Precautions/Restrictions  Precautions Precautions: Fall Precaution Comments: New Rt BKA, with protector Restrictions Weight Bearing Restrictions: Yes RLE Weight Bearing: Non weight bearing Other Position/Activity Restrictions: new right BKA General   Vital Signs Therapy Vitals Temp: 98.7 F (37.1 C) Temp Source: Oral Pulse Rate: (!) 103 Resp: 19 BP: (!) 143/72 mmHg Patient Position (if appropriate): Lying Oxygen Therapy SpO2: 97 % O2 Device: Not Delivered Pain Pain Assessment Pain Assessment: 0-10 Pain Score: 8  Pain Type: Acute pain Pain Location: Leg Pain Orientation: Right Pain Descriptors / Indicators: Sore Pain Onset: On-going Pain Intervention(s): Medication (See eMAR) Home Living/Prior Functioning Home Living Available Help at Discharge: Family Type of Home: House Home Access: Stairs to enter Technical brewer of Steps: 4 steps to enter at [employer] and 8 steps to get to porch at girlfriend's house Alternate Level Stairs-Number of Steps: two level home at [employer] with full flight to get to bedroom and full bath, girlfriend's home is one level  Lives With: Alone (could move in with mom or girlfriend) Prior Function Level of Independence: Independent with basic ADLs, Independent with homemaking with ambulation, Independent with gait  Able to Take  Stairs?: Yes Driving: No Vocation: Unemployed Leisure: Hobbies-yes (Comment) (reading, writing, visiting with friends and family) Comments: Independent prior to admission on 12/09/13. ADL  See FIM Vision/Perception  Vision- History Baseline Vision/History: No visual deficits Patient Visual Report: Blurring of vision Vision- Assessment Vision Assessment?: No apparent visual deficits  Cognition Overall Cognitive Status: Within Functional Limits for tasks assessed Arousal/Alertness: Awake/alert Orientation Level: Oriented X4 Safety/Judgment: Appears intact Sensation Sensation Light Touch: Appears Intact (in BUE) Proprioception: Appears Intact (in BUE) Motor    Mobility  Bed Mobility Bed Mobility: Rolling Left;Left Sidelying to Sit;Sit to Sidelying Left Rolling Left: 4: Min guard Left Sidelying to Sit: 4: Min guard Sit to Sidelying Left: 4: Min assist Sit to Sidelying Left Details: Tactile cues for weight shifting;Tactile cues for placement  Extremity/Trunk Assessment RUE Assessment RUE Assessment: Within Functional Limits LUE Assessment LUE Assessment: Within Functional Limits  FIM:  FIM - Grooming Grooming Steps: Wash, rinse, dry face;Oral care, brush teeth, clean dentures Grooming: 3: Patient completes 2 of 4 or 3 of 5 steps FIM - Bathing Bathing: 0: Activity did not occur FIM - Upper Body Dressing/Undressing Upper body dressing/undressing: 0: Activity did not occur FIM - Lower Body Dressing/Undressing Lower body dressing/undressing: 0: Activity did not occur FIM - Bed/Chair Transfer Bed/Chair Transfer Assistive Devices: Bed rails;HOB elevated Bed/Chair Transfer: 4: Supine > Sit: Min A (steadying Pt. > 75%/lift 1 leg);4: Sit > Supine: Min A (steadying pt. > 75%/lift 1 leg) FIM - Tub/Shower Transfers Tub/shower Transfers: 0-Activity did not occur or was simulated   Refer  to Care Plan for Long Term Goals  Recommendations for other services: Neuropsych  Discharge  Criteria: Patient will be discharged from OT if patient refuses treatment 3 consecutive times without medical reason, if treatment goals not met, if there is a change in medical status, if patient makes no progress towards goals or if patient is discharged from hospital.  The above assessment, treatment plan, treatment alternatives and goals were discussed and mutually agreed upon: by patient  Simonne Come 12/27/2013, 8:54 AM

## 2013-12-27 NOTE — Progress Notes (Signed)
Occupational Therapy Session Note  Patient Details  Name: [name] MRN: [id number] Date of Birth: 07-16-1975  Today's Date: 12/27/2013 OT Individual Time: 1300-1330 OT Individual Time Calculation (min): 30 min    Short Term Goals: Week 1:  OT Short Term Goal 1 (Week 1): Pt will complete bathing with mod assist at sit > stand level OT Short Term Goal 2 (Week 1): Pt will complete LB dressing with mod assist at sit > stand level OT Short Term Goal 3 (Week 1): Pt will complete toilet transfer with max assist of 1 caregiver OT Short Term Goal 4 (Week 1): Pt will tolerate out of bed activity for full 60 minute session with no episodes of nausea or vomiting  Skilled Therapeutic Interventions/Progress Updates:    Engaged in therapeutic activity with focus on bed mobility and squat pivot transfers.  Pt received supine in bed, reporting feeling less nauseous but having not eaten much today.  Encouraged pt to get OOB to attempt eating lunch.  Pt required increased time and verbal cues to participate in session.  Educated on squat pivot and stand pivot transfers, encouraging pt to attempt squat pivot at this time and educated on proper hand placement for transfer.  Min assist with mod verbal cues for weight shifting to clear buttocks during transfer.  Rt limbguard came loose during transfer, reapplied while seated in w/c.  Pt setup in w/c for lunch with all items in reach.  Therapy Documentation Precautions:  Precautions Precautions: Fall Precaution Comments: New Rt BKA, with protector Required Braces or Orthoses: Knee Immobilizer - Right (prosthetic limbguard) Restrictions Weight Bearing Restrictions: Yes RLE Weight Bearing: Non weight bearing Other Position/Activity Restrictions: new right BKA General:   Vital Signs: Therapy Vitals BP: (!) 168/96 mmHg Pain: Pain Assessment Pain Assessment: 0-10 Pain Score: 7  Pain Type: Surgical pain Pain Location: Leg Pain Orientation: Right Pain  Descriptors / Indicators: Throbbing Pain Onset: On-going Pain Intervention(s): Emotional support;Rest Multiple Pain Sites: No  See FIM for current functional status  Therapy/Group: Individual Therapy  Simonne Come 12/27/2013, 2:32 PM

## 2013-12-27 NOTE — Evaluation (Signed)
Physical Therapy Assessment and Plan  Patient Details  Name: [name] MRN: [id number] Date of Birth: [birth year]  PT Diagnosis: Difficulty walking, Dizziness and giddiness, Edema, Impaired sensation, Muscle weakness and Pain in R residual limb Rehab Potential: Good ELOS: 10-12 days   Today's Date: 12/27/2013 PT Individual Time: 0900-1000 Treatment Session 2: 1400-1430 PT Individual Time Calculation (min): 60 min  Treatment Session 2: 30 min  Problem List:  Patient Active Problem List   Diagnosis Date Noted  . Amputation of right lower extremity below knee 12/26/2013  . Diabetic ulcer of right foot   . Cellulitis and abscess   . Abscess of right hip 12/20/2013  . Leukocytosis 12/18/2013  . Diarrhea 12/18/2013  . Supratherapeutic INR 12/18/2013  . Hypoglycemia 12/17/2013  . Cellulitis and abscess of right foot 12/12/2013  . Sepsis 12/12/2013  . Severe obesity (BMI >= 40) 08/14/2013  . GERD (gastroesophageal reflux disease) 08/14/2013  . Hyperlipidemia 02/19/2013  . S/P cardiac cath, 02/18/13, normal coronaries 02/19/2013  . CKD stage 3 due to type 1 diabetes mellitus 02/19/2013  . Chest pain, neg MI, normal coronaries by cath 02/18/2013  . Bilateral leg edema 12/24/2012  . Swelling of thumb, left 10/02/2012  . Failure to attend appointment 06/10/2012  . Nephrotic syndrome 02/12/2012  . Right foot pain 02/09/2012  . Essential hypertension 07/24/2011  . Acute venous embolism and thrombosis of deep vessels of proximal lower extremity 07/19/2011  . Long term (current) use of anticoagulants 07/19/2011  . Foot ulcer, right 07/12/2011  . Back pain 07/12/2011  . DEEP VENOUS THROMBOPHLEBITIS, HX OF 07/28/2009  . PROTEINURIA, MILD 02/01/2007  . Morbid obesity 10/26/2005  . DM (diabetes mellitus) type I uncontrolled, periph vascular disorder 01/09/2002    Past Medical History:  Past Medical History  Diagnosis Date  . DVT (deep venous thrombosis) 09/2002    patient reports  additional DVTs in '06 & '11 (unconfirmed)  . Pulmonary embolism 09/2002    treated with 6 months of warfarin  . Hypertension   . Chest pain, neg MI, normal coronaries by cath 02/18/2013  . Hyperlipidemia 02/19/2013  . Acute venous embolism and thrombosis of deep vessels of proximal lower extremity 07/19/2011  . Type I diabetes mellitus dx'd 2001  . GERD (gastroesophageal reflux disease)   . Diabetic ulcer of right foot   . CKD (chronic kidney disease) stage 3, GFR 30-59 ml/min 02/19/2013  . Nephrotic syndrome    Past Surgical History:  Past Surgical History  Procedure Laterality Date  . Incision and drainage abscess  2007; 2015    "back"  . Cardiac catheterization  02/18/2013    normal coronaries  . I&d extremity Right 12/14/2013    Procedure: IRRIGATION AND DEBRIDEMENT RIGHT FOOT;  Surgeon: [name], MD;  Location: [place];  Service: Orthopedics;  Laterality: Right;  . Left heart catheterization with coronary angiogram N/A 02/18/2013    Procedure: LEFT HEART CATHETERIZATION WITH CORONARY ANGIOGRAM;  Surgeon: [name], MD;  Location: [facility] CATH LAB;  Service: Cardiovascular;  Laterality: N/A;  . Amputation Right 12/22/2013    Procedure: AMPUTATION BELOW KNEE;  Surgeon: [name], MD;  Location: [place];  Service: Orthopedics;  Laterality: Right;  . Incision and drainage of wound Right 12/22/2013    Procedure: I&D RIGHT BUTTOCK;  Surgeon: [name], MD;  Location: [place];  Service: Orthopedics;  Laterality: Right;    Assessment & Plan Clinical Impression: Numa Schroeter is a 38 y.o. male with history of DM type 2  with diabetic foot ulcer, morbid obesity, HTN, CKD, on chronic coumadin for recurrent DVT/PE, medical non-compliance; who was admitted via MD office on 12/02 with fever chills, N/V and right foot pain. He was started on IV antibiotics for cellulitis right foot as well as IVF for AKI on CKD. MRI of right foot revealed skin ulcer on plantar surface 4th MTP with underlying  abscess but no osteo or septic joint. Dr. Doran Durand was consulted for input and performed I and D at bedside with cultures. Cultures positive for MRSA. Patient developed recurrent fever with chills on 12/05 with progressive leucocytosis. Repeat MRI right foot on 12/06 revealed new abscess tracking medial plantar surface below 1st and 2nd MT with extensive edema due to cellulitis as well as myositis. He was taken to OR on 12/7 for I and D by Dr. Veverly Fells. Antibiotics narrowed to vancomycin. Patient continued to have abdominal discomfort with N/V. Stool negative for C diff. CT abdomen/pelvis with atelectasis but no signs of infection. Patient with complaints of right hip pain and ultrasound with superficial abscess. Pain has improved but he continued to have persistent leucocytosis and ID consulted for input. Dr. Megan Salon recommended I and D of superficial right buttock abscess as well as continuing Vancomycin alone.   Patient continued to have poor wound healing with wet gangrene and failed attempts at limb salvage. He underwent R-BKA with I and D right buttock abscess by Dr. Doran Durand on 12/22/13. He has had issues with depressed mood and difficulty dealing with amputation. ID recommends 14 days Vanc post op to treat buttock wound. Bio Tech consulted for [employer] device for limb protection. CBC done to day revealing drop in Hgb to 6.5 and patient to be transfused with 2 units PRBCs. Therapy ongoing with and patient limited by pain and weakness but is showing improvement in participation with activity.  Patient transferred to CIR on 12/26/2013 .   Patient currently requires mod with mobility secondary to muscle weakness, decreased cardiorespiratoy endurance, decreased coordination, decreased visual acuity and decreased sitting balance, decreased standing balance, decreased balance strategies and difficulty maintaining precautions.  Prior to hospitalization, patient was independent  with mobility and lived with Family  (mother) in a 71 (townhouse) home.  Home access is 2 + 1 to enter mom's house; 4 steps to enter girlfriend's houseStairs to enter.  Patient will benefit from skilled PT intervention to maximize safe functional mobility, minimize fall risk and decrease caregiver burden for planned discharge home with intermittent assist.  Anticipate patient will benefit from follow up [facility] at discharge.  PT - End of Session Activity Tolerance: Tolerates < 10 min activity, no significant change in vital signs Endurance Deficit: Yes Endurance Deficit Description: nausea and vomiting on eval, required multiple rest breaks PT Assessment Rehab Potential (ACUTE/IP ONLY): Good Barriers to Discharge: [place] home environment PT Patient demonstrates impairments in the following area(s): Balance;Skin Integrity;Behavior;Edema;Endurance;Motor;Pain;Safety;Sensory PT Transfers Functional Problem(s): Bed Mobility;Bed to Chair;Car;Furniture PT Locomotion Functional Problem(s): Ambulation;Wheelchair Mobility;Stairs PT Plan PT Intensity: Minimum of 1-2 x/day ,45 to 90 minutes PT Frequency: 5 out of 7 days PT Duration Estimated Length of Stay: 10-12 days PT Treatment/Interventions: Ambulation/gait training;Discharge planning;Functional mobility training;Psychosocial support;Therapeutic Activities;Balance/vestibular training;Disease management/prevention;Neuromuscular re-education;Visual/perceptual remediation/compensation;Skin care/wound management;Therapeutic Exercise;Wheelchair propulsion/positioning;DME/adaptive equipment instruction;Pain management;Splinting/orthotics;UE/LE Strength taining/ROM;Community reintegration;Patient/family education;Stair training;UE/LE Coordination activities PT Transfers Anticipated Outcome(s): Mod I PT Locomotion Anticipated Outcome(s): Mod I PT Recommendation Follow Up Recommendations: Home health PT Patient destination: Home Equipment Recommended: To be determined;Wheelchair  (measurements);Wheelchair cushion (measurements);Rolling walker with 5" wheels  Skilled Therapeutic  Intervention Treatment Session 1: PT Evaluation: PT initiates evaluation and finds pt to be limited by c/o nausea (pt constipated x 4 days and vomits during PT eval). Pt found to be able to move with min-mod A in bed and standing, but is limited by fear of falling and nausea. Pt will benefit from IPR PT to maximize safety with functional mobility.   Therapeutic Activity: PT instructs pt in supine to sit req min A without bedrail, scooting edge of bed req CGA-SBA, sit to stand with L bedrail and R handhold on stabilized w/c req min A, sit to supine transfer req mod A for B LEs due to pt attempt to bear weight through distal end of residual limb an then inability to get L LE into bed.   Therapeutic Exercise: PT instructs pt in LAQ with R residual limb x 10 reps - pt apprehensive of movement and req rest breaks during this activity.   Treatment Session 2: Pt received sitting up in w/c - requests to urinate.   Therapeutic Activity: PT instructs pt in sit to stand with +2 assist from RN and PT with bari RW and pt unable to achieve full stand safely.  PT instructs pt in w/c push-up and pt demonstrates ability to do so with CGA for safety.  PT instructs pt in scoot transfer from w/c to bed to L with bedrail assist req +2 assist for safety - one person stabilizing the w/c and the other providing CGA-min A.   Pt demonstrates very delayed movements, but PT suspects this is baseline. Pt will benefit from IPR PT to maximize safety with functional mobility. Cont per PT POC.   PT Evaluation Precautions/Restrictions Precautions Precautions: Fall Required Braces or Orthoses: Knee Immobilizer - Right (prosthetic limbguard) Restrictions Weight Bearing Restrictions: Yes RLE Weight Bearing: Non weight bearing General Vitals monitored during session - see flowsheet for details. Pain Pain Assessment Pain  Assessment: 0-10 Pain Score: 7  Pain Type: Acute pain Pain Location: Leg Pain Orientation: Right Pain Descriptors / Indicators: Aching Pain Onset: On-going Pain Intervention(s): Medication (See eMAR) Multiple Pain Sites: No  Treatment Session 2: Pt c/o 7/10 throbbing pain in R LE and PT utilizes rest and emotional support to decrease pain.   Home Living/Prior Functioning Home Living Available Help at Discharge: Family Type of Home: Apartment (townhouse) Home Access: Stairs to enter [employer] of Steps: 2 + 1 to enter mom's house; 4 steps to enter girlfriend's house Entrance Stairs-Rails: None Home Layout: One level;Two level (2 level at mom's; 1 level at girlfriend's) Alternate Level Stairs-Number of Steps: two level home at mother's with full flight to get to bedroom and full bath, girlfriend's home is one level Alternate Level Stairs-Rails: Left (wall on R) Additional Comments: d/c destination TBD  Lives With: Family (mother) Prior Function Level of Independence: Independent with basic ADLs;Independent with homemaking with ambulation;Independent with gait;Independent with transfers  Able to Take Stairs?: Yes Driving: No (license suspended ) Vocation: Unemployed Comments: Independent prior to admission on 12/09/13. Cognition Overall Cognitive Status: Within Functional Limits for tasks assessed Arousal/Alertness: Awake/alert Orientation Level: Oriented X4 Attention: Focused Focused Attention: Appears intact Problem Solving: Appears intact Behaviors: Impulsive (slightly impulsive) Safety/Judgment: Appears intact Sensation Sensation Light Touch: Impaired Detail Light Touch Impaired Details: Impaired RLE Stereognosis: Not tested Hot/Cold: Not tested Additional Comments: numb and tingly R distal residual limb Coordination Gross Motor Movements are Fluid and Coordinated: No Fine Motor Movements are Fluid and Coordinated: Not tested Coordination and Movement  Description:  due to morbid obesity, pain, and nausea - slowed movements Finger Nose Finger Test: wfl bilaterally Motor  Motor Motor: Within Functional Limits  Mobility Bed Mobility Bed Mobility: Rolling Left Rolling Right: 4: Min guard Rolling Right Details (indicate cue type and reason): CGA for safety due to pt slightly impulsive and not used to amputated body part, yet.  Left Sidelying to Sit: 4: Min assist Left Sidelying to Sit Details: Manual facilitation for placement;Verbal cues for technique Sit to Sidelying Left: 3: Mod assist Sit to Sidelying Left Details: Manual facilitation for placement Sit to Sidelying Left Details (indicate cue type and reason): for B LEs to maintain precaution on R LE and assist L LE into bed Transfers Transfers: Yes Sit to Stand: 4: Min assist Sit to Stand Details: Manual facilitation for placement;Verbal cues for safe use of DME/AE Sit to Stand Details (indicate cue type and reason): with L bedrail and R w/c for hand holds Locomotion   Trunk/Postural Assessment  Cervical Assessment Cervical Assessment: Within Functional Limits Thoracic Assessment Thoracic Assessment: Within Functional Limits Lumbar Assessment Lumbar Assessment: Within Functional Limits Postural Control Postural Control: Deficits on evaluation Righting Reactions: inaccurate - likely due to new amputation and significant weight lost on R LE  Balance Balance Balance Assessed: Yes Static Sitting Balance Static Sitting - Balance Support: Bilateral upper extremity supported;Feet unsupported Static Sitting - Level of Assistance: 5: Stand by assistance Dynamic Sitting Balance Dynamic Sitting - Balance Support: Bilateral upper extremity supported;During functional activity;Feet unsupported Dynamic Sitting - Level of Assistance: 4: Min assist Dynamic Sitting - Balance Activities: Lateral lean/weight shifting;Forward lean/weight shifting Static Standing Balance Static Standing -  Balance Support: Bilateral upper extremity supported;During functional activity Static Standing - Level of Assistance: 4: Min assist Extremity Assessment  RUE Assessment RUE Assessment: Within Functional Limits LUE Assessment LUE Assessment: Within Functional Limits RLE Assessment RLE Assessment: Exceptions to [facility] RLE AROM (degrees) Overall AROM Right Lower Extremity: Deficits;Due to pain (knee flexion limited to 90 degrees (limb guard not on when PT entered room). Also, R BKA) RLE Strength RLE Overall Strength: Deficits;Due to pain Right Hip Flexion: 4/5 Right Knee Flexion: 4/5 Right Knee Extension: 4/5 LLE Assessment LLE Assessment: Within Functional Limits  FIM:  FIM - Bed/Chair Transfer Bed/Chair Transfer Assistive Devices: Bed rails;HOB elevated Bed/Chair Transfer: 4: Supine > Sit: Min A (steadying Pt. > 75%/lift 1 leg);4: Sit > Supine: Min A (steadying pt. > 75%/lift 1 leg)   Refer to Care Plan for Long Term Goals  Recommendations for other services: None  Discharge Criteria: Patient will be discharged from PT if patient refuses treatment 3 consecutive times without medical reason, if treatment goals not met, if there is a change in medical status, if patient makes no progress towards goals or if patient is discharged from hospital.  The above assessment, treatment plan, treatment alternatives and goals were discussed and mutually agreed upon: by patient  [facility] M 12/27/2013, 12:48 PM

## 2013-12-27 NOTE — Plan of Care (Signed)
Problem: RH BOWEL ELIMINATION Goal: RH STG MANAGE BOWEL WITH ASSISTANCE STG Manage Bowel with min Assistance.  Outcome: Not Progressing On scheduled laxatives-refuses additional laxatives   Problem: RH PAIN MANAGEMENT Goal: RH STG PAIN MANAGED AT OR BELOW PT'S PAIN GOAL Less than 3 out of 10 Outcome: Not Progressing Down to 6 out of 10 with PRN pain medication

## 2013-12-27 NOTE — Progress Notes (Addendum)
ANTICOAGULATION - Antibiotic Follow Up   Pharmacy Consult for Warfarin/Vancomycin Indication: Hx recurrent DVTs - Foot and gluteal wounds with drainage  No Known Allergies  Patient Measurements: Weight:  (pt refused)  Vital Signs: Temp: 98.7 F (37.1 C) (12/19 0517) Temp Source: Oral (12/19 0517) BP: 143/72 mmHg (12/19 0517) Pulse Rate: 103 (12/19 0517)  Labs:  Recent Labs  12/25/13 0519 12/25/13 0800 12/26/13 0103 12/26/13 0439  HGB 6.9* 6.5* 8.4* 8.1*  HCT 21.3* 20.8* 26.3* 25.6*  PLT 435*  --  387 406*  LABPROT 16.4*  --   --  17.0*  INR 1.31  --   --  1.37  CREATININE 2.24*  --   --  2.20*   Estimated Creatinine Clearance: 77.5 mL/min (by C-G formula based on Cr of 2.2).  Assessment: 24 YOM on warfarin PTA for hx of recurrent DVTs. INR on admission was SUBtherapeutic and had a rapid rise when doses were initiated on 12/4. Hgb remains low but no bleeding noted, likely post-op anemia.  He did not have an INR drawn today but doubt that he is within goal after one dose yesterday.  Will order labs for the morning.  Vancomycin: Vancomycin trough down from 23 to 19.9 and he seems to be clearing adequately.  Creatinine continues to trend down and I suspect his clearance may increase.  The plan is to continue IV antibiotics through 01/05/2014 unless discharged and then he will be transitioned to oral Doxycycline.  Given the extensiveness of his wounds and ongoing need for debridement, will continue IV Vancomycin at current dosing.  Goal of Therapy:  INR 2-3  Vanc Trough 15-20 mcg/ml   Plan:  1. Coumadin 5mg  PO x 1 tonight  2. F/u AM INR 3. Consider a heparin bridge while INR is subtherapeutic 4.  No change in IV Vancomycin - have put stop date at 12/28.  Rober Minion, PharmD., MS Clinical Pharmacist Pager:  [phone] Thank you for allowing pharmacy to be part of this patients care team. 12/27/2013 8:52 AM

## 2013-12-27 NOTE — Progress Notes (Signed)
Subjective: Patient had an episode of emesis this morning. This has happened occasionally over the past several days. He also notes long-term nausea with rare emesis at home. Objective: BP 143/72 mmHg  Pulse 103  Temp(Src) 98.7 F (37.1 C) (Oral)  Resp 19  Wt   SpO2 97% Morbidly obese male in no acute distress. Chest clear to auscultation. Cardiac exam S1 and S2 are regular. Abdominal exam active bowel sounds, soft. Extremities with right BKA and 2-3+ edema of the left lower extremity.  A/p:  Medical Problem List and Plan: 1. Functional deficits secondary to right-BKA 2. DVT Prophylaxis/Anticoagulation: Pharmaceutical: Lovenox 3. Pain Management: Premedicate prior to therapy sessions.  4. Mood: team to provide ego support. Will add lexapro for mood stabilization. LCSW to follow for evaluation and support. Will need psych follow up also.  5. Neuropsych: This patient is capable of making decisions on his own behalf. 6. Skin/Wound Care: Daily dressing changes to left buttock wound. Routine pressure relief measures. Monitor wound daily for healing. Maintain adequate nutrition. .  7. Fluids/Electrolytes/Nutrition: Monitor I/O. Add protein supplements to help with low protein stores.  8. Reactive leucocytosis: Resolving. Recheck in am. Monitor wound and temperature curve daily.  9. DM type 2: Po intake remains poor. On Lantus to prevent hypoglycemic episodes. Will need to be changed to 70/30 prior to d/c home due to financial constraints. Will monitor BS with ac/hs checks and titrate insulin as indicated. Reinforce importance of medication compliance.  CBG (last 3)   Recent Labs  12/26/13 1036 12/26/13 1606 12/26/13 2052  GLUCAP 92 83 115*   10. CKD: Monitor renal status closely as on IV Vancomycin. Avoid other nephrotoxic drugs. Will recheck lytes in am.  11. HTN: Monitor BP tid on norvasc. Off prinizide and lasix due to AKI on CKD. Baseline Cr- 1.78 but is stabilizing to 2.3  range.  143/72-169/93 12. Acute on chronic anemia: Low iron stores with high ferritin levels. Likely poor absorption. Transfused with 2 units on 12/17. Recheck labs on Monday.  Lab Results  Component Value Date   HGB 8.1* 12/26/2013    13. Left buttock wound: S/p I & D 12/14--antibiotic Day # 4/14  14. Resting tachycardia: HR in 102-103 . Multifactorial due to anemia as well as deconditioning. Will monitor for symptoms with activity. May need low dose BB.

## 2013-12-28 ENCOUNTER — Inpatient Hospital Stay (HOSPITAL_COMMUNITY): Payer: Self-pay | Admitting: Occupational Therapy

## 2013-12-28 LAB — GLUCOSE, CAPILLARY
Glucose-Capillary: 93 mg/dL (ref 70–99)
Glucose-Capillary: 106 mg/dL — ABNORMAL HIGH (ref 70–99)
Glucose-Capillary: 102 mg/dL — ABNORMAL HIGH (ref 70–99)
Glucose-Capillary: 91 mg/dL (ref 70–99)

## 2013-12-28 LAB — PROTIME-INR
INR: 1.52 — ABNORMAL HIGH (ref 0.00–1.49)
PROTHROMBIN TIME: 18.5 s — AB (ref 11.6–15.2)

## 2013-12-28 MED ORDER — WARFARIN SODIUM 5 MG PO TABS
5.0000 mg | ORAL_TABLET | Freq: Once | ORAL | Status: AC
  Administered 2013-12-28: 5 mg via ORAL
  Filled 2013-12-28: qty 1

## 2013-12-28 MED ORDER — SODIUM CHLORIDE 0.9 % IV SOLN
INTRAVENOUS | Status: AC
  Administered 2013-12-28: 1000 mL via INTRAVENOUS
  Administered 2013-12-28 – 2013-12-29 (×2): via INTRAVENOUS

## 2013-12-28 MED ORDER — INSULIN GLARGINE 100 UNIT/ML ~~LOC~~ SOLN
18.0000 [IU] | Freq: Once | SUBCUTANEOUS | Status: AC
  Administered 2013-12-28: 18 [IU] via SUBCUTANEOUS
  Filled 2013-12-28: qty 0.18

## 2013-12-28 MED ORDER — CARVEDILOL 6.25 MG PO TABS
6.2500 mg | ORAL_TABLET | Freq: Two times a day (BID) | ORAL | Status: DC
  Administered 2013-12-28 – 2014-01-06 (×17): 6.25 mg via ORAL
  Filled 2013-12-28 (×21): qty 1

## 2013-12-28 NOTE — Progress Notes (Signed)
ANTICOAGULATION - Antibiotic Follow Up   Pharmacy Consult for Warfarin/Vancomycin Indication: Hx recurrent DVTs - Foot and gluteal wounds with drainage  No Known Allergies  Patient Measurements: Weight: (!) 367 lb 11.6 oz (166.8 kg)  Vital Signs: Temp: 98.1 F (36.7 C) (12/20 0647) Temp Source: Oral (12/20 0647) BP: 163/70 mmHg (12/20 0647) Pulse Rate: 110 (12/20 0647)  Labs:  Recent Labs  12/25/13 0800 12/26/13 0103 12/26/13 0439 12/28/13 0417  HGB 6.5* 8.4* 8.1*  --   HCT 20.8* 26.3* 25.6*  --   PLT  --  387 406*  --   LABPROT  --   --  17.0* 18.5*  INR  --   --  1.37 1.52*  CREATININE  --   --  2.20*  --    Estimated Creatinine Clearance: 75.4 mL/min (by C-G formula based on Cr of 2.2).  Assessment: 4 YOM on warfarin PTA for hx of recurrent DVTs. INR this morning is trending upward (1.37>>1.52), hopefully will be within desired goal range in the next 48 hours.  No H/H this AM but no bleeding noted.  Meds reviewed and he is not on any medications that may cause drug/drug interaction with Warfarin therapy.  Vancomycin: Vancomycin continues without noted complications.   He had good UOP yesterday with negative 1.3L x 48hr.  Would expect that his renal fxn. Is returning to baseline.  He is afebrile, buttocks abscess with culture results for MRSA noted.  The plan is to continue IV antibiotics through 01/05/2014 unless discharged and then he will be transitioned to oral Doxycycline.    Goal of Therapy:  INR 2-3  Vanc Trough 15-20 mcg/ml   Plan:  1. Coumadin 5mg  PO x 1 tonight  2. F/u AM INR 3.  Continue IV Vancomycin - have put stop date at 12/28.  Rober Minion, PharmD., MS Clinical Pharmacist Pager:  [phone] Thank you for allowing pharmacy to be part of this patients care team. 12/28/2013 7:10 AM

## 2013-12-28 NOTE — Progress Notes (Signed)
Occupational Therapy Session Note  Patient Details  Name: [name] MRN: [id number] Date of Birth: 1975-04-25  Today's Date: 12/28/2013 OT Individual Time: 1400-1500 OT Individual Time Calculation (min): 60 min   Short Term Goals: Week 1:  OT Short Term Goal 1 (Week 1): Pt will complete bathing with mod assist at sit > stand level OT Short Term Goal 2 (Week 1): Pt will complete LB dressing with mod assist at sit > stand level OT Short Term Goal 3 (Week 1): Pt will complete toilet transfer with max assist of 1 caregiver OT Short Term Goal 4 (Week 1): Pt will tolerate out of bed activity for full 60 minute session with no episodes of nausea or vomiting  Skilled Therapeutic Interventions/Progress Updates:  Patient received supine in bed with complaints of 6/10 pain in right residual limb and nausea. RN aware of complaints. With min encouragement, patient willing to participate in therapy. Patient engaged in bed mobility and sat EOB on right side of bed. As soon as patient came to sitting position, patient with increased complaints of nausea and started spitting up. Therapist handed patient diet ginger ale. Patient sat EOB ~5 minutes, then transferred > w/c with steady assist. Patient took more than a reasonable amount of time to transition through movements, but did so in a safe manner. Therapist arranged room for patient to sit on left side of bed. Patient then engaged in a BUE strengthening HEP. Patient completed 2 sets of 10 shoulder flexion exercises, elbow flexion exercises, and w/c pushups. Therapist encouraged patient to work towards clearing bottom off w/c cushion during w/c pushups. During exercises, RN present for medication management during rest break. At end of session, left patient seated in w/c with RLE on amputee pad and LLE on elevating leg rest; patient with all needs within reach.   Precautions:  Precautions Precautions: Fall Precaution Comments: New Rt BKA, with  protector Required Braces or Orthoses: Knee Immobilizer - Right (prosthetic limbguard) Restrictions Weight Bearing Restrictions: Yes RLE Weight Bearing: Non weight bearing Other Position/Activity Restrictions: new right BKA  See FIM for current functional status  Therapy/Group: Individual Therapy  [name] 12/28/2013, 3:09 PM

## 2013-12-28 NOTE — Progress Notes (Signed)
2 episodes of N & V during the night. PRN zofran IV given at 0154. Taking 15mg  of Oxy ir to manage right BKA pain. [name] A

## 2013-12-28 NOTE — Progress Notes (Signed)
Subjective: Patient stated he had 2 episodes of emesis yesterday. None yet this morning. He denies any pain. States his appetite is poor. Objective: BP 163/70 mmHg  Pulse 110  Temp(Src) 98.1 F (36.7 C) (Oral)  Resp 19  Wt 367 lb 11.6 oz (166.8 kg)  SpO2 95% Morbidly obese male in no acute distress. Chest clear to auscultation. Cardiac exam S1 and S2 are regular. Abdominal exam active bowel sounds, soft. Extremities with right BKA and 2-3+ edema of the left lower extremity.  A/p:  Medical Problem List and Plan: 1. Functional deficits secondary to right-BKA 2. DVT Prophylaxis/Anticoagulation: Pharmaceutical: Lovenox 3. Pain Management:  4. Mood: team to provide ego support. Will add lexapro for mood stabilization. LCSW to follow for evaluation and support. Will need psych follow up also.  5. Neuropsych: This patient is capable of making decisions on his own behalf. 6. Skin/Wound Care: Daily dressing changes to left buttock wound. Routine pressure relief measures. Monitor wound daily for healing. Maintain adequate nutrition. .  7. Fluids/Electrolytes/Nutrition: Monitor I/O. He is not eating much at this time. He is drinking some. I will add IV fluids for 24 hours. 8. Reactive leucocytosis: Resolving. Recheck in am. Monitor wound and temperature curve daily.  9. DM type 2: Po intake remains poor. On Lantus to prevent hypoglycemic episodes. Will need to be changed to 70/30 prior to d/c home due to financial constraints. Will monitor BS with ac/hs checks and titrate insulin as indicated. Reinforce importance of medication compliance.  CBG (last 3)   Recent Labs  12/27/13 1654 12/27/13 2104 12/28/13 0732  GLUCAP 141* 134* 93   10. CKD: Monitor renal status closely as on IV Vancomycin. Avoid other nephrotoxic drugs.  Basic Metabolic Panel:    Component Value Date/Time   NA 139 12/26/2013 0439   K 4.9 12/26/2013 0439   CL 109 12/26/2013 0439   CO2 20 12/26/2013 0439   BUN 32*  12/26/2013 0439   CREATININE 2.20* 12/26/2013 0439   CREATININE 1.78* 12/09/2013 1559   GLUCOSE 117* 12/26/2013 0439   CALCIUM 8.2* 12/26/2013 0439    11. HTN: Monitor BP tid on norvasc. Off prinizide and lasix due to AKI on CKD. Baseline Cr- 1.78 but is stabilizing to 2.3 range.  What pressure ranging from 160-178/95-96 Will increase carvedilol 12. Acute on chronic anemia: Low iron stores with high ferritin levels. Likely poor absorption. Transfused with 2 units on 12/17. Recheck labs on Monday.  Lab Results  Component Value Date   HGB 8.1* 12/26/2013    13. Left buttock wound: S/p I & D 12/14--antibiotic Day # 4/14  14. Resting tachycardia: HR in 102-103 . Multifactorial due to anemia as well as deconditioning. Will monitor for symptoms with activity. May need low dose BB.

## 2013-12-29 ENCOUNTER — Inpatient Hospital Stay (HOSPITAL_COMMUNITY): Payer: Medicaid Other

## 2013-12-29 ENCOUNTER — Inpatient Hospital Stay (HOSPITAL_COMMUNITY): Payer: Self-pay

## 2013-12-29 DIAGNOSIS — E785 Hyperlipidemia, unspecified: Secondary | ICD-10-CM

## 2013-12-29 LAB — CBC WITH DIFFERENTIAL/PLATELET
Basophils Absolute: 0.1 10*3/uL (ref 0.0–0.1)
Basophils Relative: 0 % (ref 0–1)
EOS ABS: 0.1 10*3/uL (ref 0.0–0.7)
EOS PCT: 1 % (ref 0–5)
HEMATOCRIT: 27.1 % — AB (ref 39.0–52.0)
Hemoglobin: 8.4 g/dL — ABNORMAL LOW (ref 13.0–17.0)
LYMPHS PCT: 7 % — AB (ref 12–46)
Lymphs Abs: 1 10*3/uL (ref 0.7–4.0)
MCH: 26.8 pg (ref 26.0–34.0)
MCHC: 31 g/dL (ref 30.0–36.0)
MCV: 86.6 fL (ref 78.0–100.0)
MONO ABS: 1.1 10*3/uL — AB (ref 0.1–1.0)
Monocytes Relative: 8 % (ref 3–12)
Neutro Abs: 11.5 10*3/uL — ABNORMAL HIGH (ref 1.7–7.7)
Neutrophils Relative %: 84 % — ABNORMAL HIGH (ref 43–77)
Platelets: 394 10*3/uL (ref 150–400)
RBC: 3.13 MIL/uL — AB (ref 4.22–5.81)
RDW: 15.2 % (ref 11.5–15.5)
WBC: 13.8 10*3/uL — AB (ref 4.0–10.5)

## 2013-12-29 LAB — COMPREHENSIVE METABOLIC PANEL
ALT: 24 U/L (ref 0–53)
ANION GAP: 12 (ref 5–15)
AST: 18 U/L (ref 0–37)
Albumin: 1.7 g/dL — ABNORMAL LOW (ref 3.5–5.2)
Alkaline Phosphatase: 204 U/L — ABNORMAL HIGH (ref 39–117)
BUN: 24 mg/dL — ABNORMAL HIGH (ref 6–23)
CALCIUM: 8.2 mg/dL — AB (ref 8.4–10.5)
CO2: 20 mEq/L (ref 19–32)
CREATININE: 1.92 mg/dL — AB (ref 0.50–1.35)
Chloride: 111 mEq/L (ref 96–112)
GFR, EST AFRICAN AMERICAN: 50 mL/min — AB (ref >90)
GFR, EST NON AFRICAN AMERICAN: 43 mL/min — AB (ref >90)
GLUCOSE: 72 mg/dL (ref 70–99)
Potassium: 5.1 mEq/L (ref 3.7–5.3)
Sodium: 143 mEq/L (ref 137–147)
Total Bilirubin: 0.3 mg/dL (ref 0.3–1.2)
Total Protein: 6.9 g/dL (ref 6.0–8.3)

## 2013-12-29 LAB — PROTIME-INR
INR: 2.18 — AB (ref 0.00–1.49)
PROTHROMBIN TIME: 24.5 s — AB (ref 11.6–15.2)

## 2013-12-29 LAB — GLUCOSE, CAPILLARY
GLUCOSE-CAPILLARY: 82 mg/dL (ref 70–99)
GLUCOSE-CAPILLARY: 72 mg/dL (ref 70–99)
Glucose-Capillary: 70 mg/dL (ref 70–99)
Glucose-Capillary: 91 mg/dL (ref 70–99)

## 2013-12-29 MED ORDER — PROMETHAZINE HCL 25 MG/ML IJ SOLN
25.0000 mg | Freq: Four times a day (QID) | INTRAMUSCULAR | Status: DC | PRN
  Administered 2014-01-03 – 2014-01-17 (×5): 25 mg via INTRAVENOUS
  Filled 2013-12-29 (×6): qty 1

## 2013-12-29 MED ORDER — SORBITOL 70 % SOLN
60.0000 mL | Status: AC
  Administered 2013-12-29: 60 mL via ORAL
  Filled 2013-12-29: qty 60

## 2013-12-29 MED ORDER — WARFARIN SODIUM 2 MG PO TABS
2.0000 mg | ORAL_TABLET | Freq: Once | ORAL | Status: AC
  Administered 2013-12-29: 2 mg via ORAL
  Filled 2013-12-29: qty 1

## 2013-12-29 MED ORDER — PROMETHAZINE HCL 25 MG RE SUPP: 25.0000 mg | Freq: Four times a day (QID) | RECTAL | Status: DC | PRN

## 2013-12-29 MED ORDER — INSULIN GLARGINE 100 UNIT/ML ~~LOC~~ SOLN
20.0000 [IU] | Freq: Every day | SUBCUTANEOUS | Status: DC
  Filled 2013-12-29 (×2): qty 0.2

## 2013-12-29 MED ORDER — SORBITOL 70 % SOLN
960.0000 mL | TOPICAL_OIL | Freq: Once | ORAL | Status: AC
  Administered 2013-12-29: 960 mL via RECTAL
  Filled 2013-12-29: qty 240

## 2013-12-29 MED ORDER — PANTOPRAZOLE SODIUM 40 MG PO TBEC
40.0000 mg | DELAYED_RELEASE_TABLET | Freq: Every day | ORAL | Status: DC
  Administered 2013-12-29 – 2014-01-03 (×6): 40 mg via ORAL
  Filled 2013-12-29 (×6): qty 1

## 2013-12-29 MED ORDER — SORBITOL 70 % SOLN
45.0000 mL | Status: DC
  Filled 2013-12-29 (×2): qty 60

## 2013-12-29 MED ORDER — PROMETHAZINE HCL 12.5 MG PO TABS
12.5000 mg | ORAL_TABLET | Freq: Four times a day (QID) | ORAL | Status: DC | PRN
  Administered 2013-12-29 – 2014-01-18 (×6): 12.5 mg via ORAL
  Filled 2013-12-29 (×6): qty 1

## 2013-12-29 MED ORDER — INSULIN GLARGINE 100 UNIT/ML ~~LOC~~ SOLN
20.0000 [IU] | Freq: Once | SUBCUTANEOUS | Status: AC
  Administered 2013-12-30: 20 [IU] via SUBCUTANEOUS
  Filled 2013-12-29 (×2): qty 0.2

## 2013-12-29 NOTE — Progress Notes (Addendum)
Subjective/Complaints: Patient with pain in the stump area. No buttocks pain. Nausea. Some vomiting last night. Nursing has The bed flat. Takes antacids at home history of GERD Objective: Vital Signs: Blood pressure 155/78, pulse 77, temperature 98.7 F (37.1 C), temperature source Oral, resp. rate 17, weight 166.8 kg (367 lb 11.6 oz), SpO2 97 %. No results found. Results for orders placed or performed during the hospital encounter of 12/26/13 (from the past 72 hour(s))  Glucose, capillary     Status: Abnormal   Collection Time: 12/26/13  8:52 PM  Result Value Ref Range   Glucose-Capillary 115 (H) 70 - 99 mg/dL  Vancomycin, trough     Status: None   Collection Time: 12/27/13  3:42 AM  Result Value Ref Range   Vancomycin Tr 19.9 10.0 - 20.0 ug/mL  Glucose, capillary     Status: Abnormal   Collection Time: 12/27/13  7:38 AM  Result Value Ref Range   Glucose-Capillary 142 (H) 70 - 99 mg/dL   Comment 1 Documented in Chart   Glucose, capillary     Status: Abnormal   Collection Time: 12/27/13 11:53 AM  Result Value Ref Range   Glucose-Capillary 118 (H) 70 - 99 mg/dL  Glucose, capillary     Status: Abnormal   Collection Time: 12/27/13  4:54 PM  Result Value Ref Range   Glucose-Capillary 141 (H) 70 - 99 mg/dL   Comment 1 Notify RN   Glucose, capillary     Status: Abnormal   Collection Time: 12/27/13  9:04 PM  Result Value Ref Range   Glucose-Capillary 134 (H) 70 - 99 mg/dL  Protime-INR     Status: Abnormal   Collection Time: 12/28/13  4:17 AM  Result Value Ref Range   Prothrombin Time 18.5 (H) 11.6 - 15.2 seconds   INR 1.52 (H) 0.00 - 1.49  Glucose, capillary     Status: None   Collection Time: 12/28/13  7:32 AM  Result Value Ref Range   Glucose-Capillary 93 70 - 99 mg/dL   Comment 1 Documented in Chart   Glucose, capillary     Status: Abnormal   Collection Time: 12/28/13 11:35 AM  Result Value Ref Range   Glucose-Capillary 106 (H) 70 - 99 mg/dL  Glucose, capillary      Status: Abnormal   Collection Time: 12/28/13  5:05 PM  Result Value Ref Range   Glucose-Capillary 102 (H) 70 - 99 mg/dL  Glucose, capillary     Status: None   Collection Time: 12/28/13  9:07 PM  Result Value Ref Range   Glucose-Capillary 91 70 - 99 mg/dL  Glucose, capillary     Status: None   Collection Time: 12/29/13  7:02 AM  Result Value Ref Range   Glucose-Capillary 70 70 - 99 mg/dL   Comment 1 Notify RN   CBC WITH DIFFERENTIAL     Status: Abnormal   Collection Time: 12/29/13  7:48 AM  Result Value Ref Range   WBC 13.8 (H) 4.0 - 10.5 K/uL   RBC 3.13 (L) 4.22 - 5.81 MIL/uL   Hemoglobin 8.4 (L) 13.0 - 17.0 g/dL   HCT 27.1 (L) 39.0 - 52.0 %   MCV 86.6 78.0 - 100.0 fL   MCH 26.8 26.0 - 34.0 pg   MCHC 31.0 30.0 - 36.0 g/dL   RDW 15.2 11.5 - 15.5 %   Platelets 394 150 - 400 K/uL   Neutrophils Relative % 84 (H) 43 - 77 %   Neutro Abs 11.5 (H)  1.7 - 7.7 K/uL   Lymphocytes Relative 7 (L) 12 - 46 %   Lymphs Abs 1.0 0.7 - 4.0 K/uL   Monocytes Relative 8 3 - 12 %   Monocytes Absolute 1.1 (H) 0.1 - 1.0 K/uL   Eosinophils Relative 1 0 - 5 %   Eosinophils Absolute 0.1 0.0 - 0.7 K/uL   Basophils Relative 0 0 - 1 %   Basophils Absolute 0.1 0.0 - 0.1 K/uL  Comprehensive metabolic panel     Status: Abnormal   Collection Time: 12/29/13  7:48 AM  Result Value Ref Range   Sodium 143 137 - 147 mEq/L   Potassium 5.1 3.7 - 5.3 mEq/L   Chloride 111 96 - 112 mEq/L   CO2 20 19 - 32 mEq/L   Glucose, Bld 72 70 - 99 mg/dL   BUN 24 (H) 6 - 23 mg/dL   Creatinine, Ser 7.73 (H) 0.50 - 1.35 mg/dL   Calcium 8.2 (L) 8.4 - 10.5 mg/dL   Total Protein 6.9 6.0 - 8.3 g/dL   Albumin 1.7 (L) 3.5 - 5.2 g/dL   AST 18 0 - 37 U/L   ALT 24 0 - 53 U/L   Alkaline Phosphatase 204 (H) 39 - 117 U/L   Total Bilirubin 0.3 0.3 - 1.2 mg/dL   GFR calc non Af Amer 43 (L) >90 mL/min   GFR calc Af Amer 50 (L) >90 mL/min    Comment: (NOTE) The eGFR has been calculated using the CKD EPI equation. This calculation has not  been validated in all clinical situations. eGFR's persistently <90 mL/min signify possible Chronic Kidney Disease.    Anion gap 12 5 - 15  Protime-INR     Status: Abnormal   Collection Time: 12/29/13  7:48 AM  Result Value Ref Range   Prothrombin Time 24.5 (H) 11.6 - 15.2 seconds   INR 2.18 (H) 0.00 - 1.49     HEENT: normal Cardio: RRR and no murmurs Resp: CTA B/L and unlabored GI: BS positive and nontender nondistended Extremity:  Edema left stump Skin:   Other Ace wrap on the left stump, left buttocks wound 1 cm diameter, 4-5 cm deep, draining serosanguineous, no odor Neuro: Alert/Oriented and Abnormal Motor decreased left knee flexion extension inhibited by pain Musc/Skel:  Other left BKA Gen NAD   Assessment/Plan: 1. Functional deficits secondary to Right BKA which require 3+ hours per day of interdisciplinary therapy in a comprehensive inpatient rehab setting. Physiatrist is providing close team supervision and 24 hour management of active medical problems listed below. Physiatrist and rehab team continue to assess barriers to discharge/monitor patient progress toward functional and medical goals. FIM: FIM - Bathing Bathing Steps Patient Completed: Chest, Right Arm, Abdomen, Front perineal area, Left Arm Bathing: 2: Max-Patient completes 3-4 75f 10 parts or 25-49%  FIM - Upper Body Dressing/Undressing Upper body dressing/undressing steps patient completed: Thread/unthread right sleeve of pullover shirt/dresss, Thread/unthread left sleeve of pullover shirt/dress, Put head through opening of pull over shirt/dress Upper body dressing/undressing: 6: More than reasonable amount of time FIM - Lower Body Dressing/Undressing Lower body dressing/undressing: 1: Total-Patient completed less than 25% of tasks        FIM - Press photographer Assistive Devices: HOB elevated, Bed rails Bed/Chair Transfer: 5: Supine > Sit: Supervision (verbal cues/safety issues), 5:  Sit > Supine: Supervision (verbal cues/safety issues)     Comprehension Comprehension Mode: Auditory Comprehension: 5-Follows basic conversation/direction: With extra time/assistive device  Expression Expression Mode: Verbal Expression:  5-Expresses basic needs/ideas: With extra time/assistive device  Social Interaction Social Interaction: 6-Interacts appropriately with others with medication or extra time (anti-anxiety, antidepressant).  Problem Solving Problem Solving: 5-Solves basic 90% of the time/requires cueing < 10% of the time  Memory Memory: 5-Recognizes or recalls 90% of the time/requires cueing < 10% of the time  Medical Problem List and Plan: 1. Functional deficits secondary to Right-BKA 2. DVT Prophylaxis/Anticoagulation: Pharmaceutical: Lovenox 3. Pain Management: Premedicate prior to therapy sessions. Continues to require IV dilaudid in addition to hydrocodone --will escalate to oxycodone prn and d/c dilaudid.  4. Mood: team to provide ego support. Will add lexapro for mood stabilization. LCSW to follow for evaluation and support. Will need psych follow up also.  5. Neuropsych: This patient is capable of making decisions on his own behalf. 6. Skin/Wound Care: Daily dressing changes to left buttock wound. Routine pressure relief measures. Monitor wound daily for healing. Maintain adequate nutrition. .  7. Fluids/Electrolytes/Nutrition: Monitor I/O. Add protein supplements to help with low protein stores.  8. Reactive leucocytosis: Resolving. Recheck in am. Monitor wound and temperature curve daily.  9. DM type 2: Po intake remains poor. On Lantus to prevent hypoglycemic episodes. Will need to be changed to 70/30 prior to d/c home due to financial constraints. Will monitor BS with ac/hs checks and titrate insulin as indicated. Reinforce importance of medication compliance.  10. CKD: Monitor renal status closely as on IV Vancomycin. Avoid other nephrotoxic drugs.  Will recheck lytes in am.  11. HTN: Monitor BP tid on norvasc. Off prinizide and lasix due to AKI on CKD. Baseline Cr- 1.78 but is stabilizing to 2.3 range.  12. Acute on chronic anemia: Low iron stores with high ferritin levels. Likely poor absorption. Transfused with 2 units on 12/17. Recheck labs on Monday.  13. Left buttock wound: S/p I & D 12/14--antibiotic Day # 7/14  14. Resting tachycardia: HR in 110 range. Multifactorial due to anemia as well as deconditioning. Will monitor for symptoms with activity. May need low dose BB.   LOS (Days) 3 A FACE TO FACE EVALUATION WAS PERFORMED  Loretta Kluender E 12/29/2013, 10:39 AM

## 2013-12-29 NOTE — Discharge Instructions (Addendum)
Inpatient Rehab Discharge Instructions  Gia Hemp Discharge date and time:  01/20/14  Activities/Precautions/ Functional Status: Activity: activity as tolerated Diet: diabetic diet Wound Care: Cleanse Right amputation site with soap and water. Pat dry and apply ace wrap. Wash area round left thigh with soap and water pat dry. Pack tract with iodoform guaze and Q tip. Cover with dry dressing. Change daily. Contact Dr. Doran Durand if you notice any odor, pus, redness, tenderness or develop fever or chills.   Functional status:  ___ No restrictions     ___ Walk up steps independently ___ 24/7 supervision/assistance   ___ Walk up steps with assistance _X__ Intermittent supervision/assistance  ___ Bathe/dress independently ___ Walk with walker     _X__ Bathe/dress with assistance ___ Walk Independently    ___ Shower independently ___ Walk with assistance    ___ Shower with assistance _X__ No alcohol     ___ Return to work/school ________     COMMUNITY REFERRALS UPON DISCHARGE:    Home Health:    RN        SW                     Agency:  [facility] Phone: [phone]   Medical Equipment/Items Ordered: wheelchair, cushion, walker, transfer board, droparm commode and tub bench                                                     Agency/Supplier: [employer] @ [phone]   GENERAL COMMUNITY RESOURCES FOR PATIENT/FAMILY:  Support Groups:  Amputee Support Group          Special Instructions:    My questions have been answered and I understand these instructions. I will adhere to these goals and the provided educational materials after my discharge from the hospital.  Patient/Caregiver Signature _______________________________ Date __________  Clinician Signature _______________________________________ Date __________  Please bring this form and your medication list with you to all your follow-up doctor's appointments.    Information on my medicine - Coumadin    (Warfarin)  This medication education was reviewed with me or my healthcare representative as part of my discharge preparation. Why was Coumadin prescribed for you? Coumadin was prescribed for you because you have a blood clot or a medical condition that can cause an increased risk of forming blood clots. Blood clots can cause serious health problems by blocking the flow of blood to the heart, lung, or brain. Coumadin can prevent harmful blood clots from forming. As a reminder your indication for Coumadin is:  History of recurrent Pulmonary embolus (PE) and Deep Vein Thrombosis (blood clot). What test will check on my response to Coumadin? While on Coumadin (warfarin) you will need to have an INR test regularly to ensure that your dose is keeping you in the desired range. The INR (international normalized ratio) number is calculated from the result of the laboratory test called prothrombin time (PT).  If an INR APPOINTMENT HAS NOT ALREADY BEEN MADE FOR YOU please schedule an appointment to have this lab work done by your health care provider within 7 days. Your INR goal is usually a number between:  2 to 3 or your provider may give you a more narrow range like 2-2.5.  Ask your health care provider during an office visit what your goal INR  is.  What  do you need to  know  About  COUMADIN? Take Coumadin (warfarin) exactly as prescribed by your healthcare provider about the same time each day.  DO NOT stop taking without talking to the doctor who prescribed the medication.  Stopping without other blood clot prevention medication to take the place of Coumadin may increase your risk of developing a new clot or stroke.  Get refills before you run out.  What do you do if you miss a dose? If you miss a dose, take it as soon as you remember on the same day then continue your regularly scheduled regimen the next day.  Do not take two doses of Coumadin at the same time.  Important Safety Information A  possible side effect of Coumadin (Warfarin) is an increased risk of bleeding. You should call your healthcare provider right away if you experience any of the following: ? Bleeding from an injury or your nose that does not stop. ? Unusual colored urine (red or dark brown) or unusual colored stools (red or black). ? Unusual bruising for unknown reasons. ? A serious fall or if you hit your head (even if there is no bleeding).  Some foods or medicines interact with Coumadin (warfarin) and might alter your response to warfarin. To help avoid this: ? Eat a balanced diet, maintaining a consistent amount of Vitamin K. ? Notify your provider about major diet changes you plan to make. ? Avoid alcohol or limit your intake to 1 drink for women and 2 drinks for men per day. (1 drink is 5 oz. wine, 12 oz. beer, or 1.5 oz. liquor.)  Make sure that ANY health care provider who prescribes medication for you knows that you are taking Coumadin (warfarin).  Also make sure the healthcare provider who is monitoring your Coumadin knows when you have started a new medication including herbals and non-prescription products.  Coumadin (Warfarin)  Major Drug Interactions  Increased Warfarin Effect Decreased Warfarin Effect  Alcohol (large quantities) Antibiotics (esp. Septra/Bactrim, Flagyl, Cipro) Amiodarone (Cordarone) Aspirin (ASA) Cimetidine (Tagamet) Megestrol (Megace) NSAIDs (ibuprofen, naproxen, etc.) Piroxicam (Feldene) Propafenone (Rythmol SR) Propranolol (Inderal) Isoniazid (INH) Posaconazole (Noxafil) Barbiturates (Phenobarbital) Carbamazepine (Tegretol) Chlordiazepoxide (Librium) Cholestyramine (Questran) Griseofulvin Oral Contraceptives Rifampin Sucralfate (Carafate) Vitamin K   Coumadin (Warfarin) Major Herbal Interactions  Increased Warfarin Effect Decreased Warfarin Effect  Garlic Ginseng Ginkgo biloba Coenzyme Q10 Green tea St. Johns wort    Coumadin (Warfarin) FOOD  Interactions  Eat a consistent number of servings per week of foods HIGH in Vitamin K (1 serving =  cup)  Collards (cooked, or boiled & drained) Kale (cooked, or boiled & drained) Mustard greens (cooked, or boiled & drained) Parsley *serving size only =  cup Spinach (cooked, or boiled & drained) Swiss chard (cooked, or boiled & drained) Turnip greens (cooked, or boiled & drained)  Eat a consistent number of servings per week of foods MEDIUM-HIGH in Vitamin K (1 serving = 1 cup)  Asparagus (cooked, or boiled & drained) Broccoli (cooked, boiled & drained, or raw & chopped) Brussel sprouts (cooked, or boiled & drained) *serving size only =  cup Lettuce, raw (green leaf, endive, romaine) Spinach, raw Turnip greens, raw & chopped   These websites have more information on Coumadin (warfarin):  FailFactory.se; VeganReport.com.au;

## 2013-12-29 NOTE — IPOC Note (Addendum)
Overall Plan of Care [facility]) Patient Details Name: [name] MRN: [id number] DOB: 02/20/1975  Admitting Diagnosis: narendra r bka  Hospital Problems: Principal Problem:   Amputation of right lower extremity below knee Active Problems:   DM (diabetes mellitus) type I uncontrolled, periph vascular disorder   Morbid obesity   Essential hypertension   Hyperlipidemia     Functional Problem List: Nursing Behavior, Pain, Skin Integrity  PT Balance, Skin Integrity, Behavior, Edema, Endurance, Motor, Pain, Safety, Sensory  OT Balance, Endurance, Motor, Pain, Safety, Skin Integrity  SLP    TR Activity tolerance, functional mobility, balance,  safety, pain, anxiety/stress        Basic ADL's: OT Grooming, Bathing, Dressing, Toileting     Advanced  ADL's: OT Simple Meal Preparation     Transfers: PT Bed Mobility, Bed to Chair, Car, Manufacturing systems engineer, Metallurgist: PT Ambulation, Emergency planning/management officer, Stairs     Additional Impairments: OT    SLP        TR      Anticipated Outcomes Item Anticipated Outcome  Self Feeding    Swallowing      Basic self-care  Mod I  Toileting  Mod I   Bathroom Transfers Mod I  Bowel/Bladder  Patient will remain continenet of bowel  Transfers  Mod I  Locomotion  Mod I  Communication     Cognition     Pain  Patient pain will be less than 5  Safety/Judgment  Patient will comply with daily dressing changes ordered   Therapy Plan: PT Intensity: Minimum of 1-2 x/day ,45 to 90 minutes PT Frequency: 5 out of 7 days PT Duration Estimated Length of Stay: 10-12 days OT Intensity: Minimum of 1-2 x/day, 45 to 90 minutes OT Frequency: 5 out of 7 days OT Duration/Estimated Length of Stay: 10-12 days  TR Duration/ELOS:  2 weeks TR Frequency:  Min 1 time per week >20 minutes       Team Interventions: Nursing Interventions Patient/Family Education, Skin Care/Wound Management, Disease Management/Prevention, Pain  Management, Psychosocial Support  PT interventions Ambulation/gait training, Discharge planning, Functional mobility training, Psychosocial support, Therapeutic Activities, Balance/vestibular training, Disease management/prevention, Neuromuscular re-education, Visual/perceptual remediation/compensation, Skin care/wound management, Therapeutic Exercise, Wheelchair propulsion/positioning, DME/adaptive equipment instruction, Pain management, Splinting/orthotics, UE/LE Strength taining/ROM, Academic librarian, Barrister's clerk education, IT trainer, UE/LE Coordination activities  OT Interventions Training and development officer, Discharge planning, Disease mangement/prevention, Engineer, drilling, Functional mobility training, Pain management, Patient/family education, Psychosocial support, Self Care/advanced ADL retraining, Skin care/wound managment, Therapeutic Activities, Therapeutic Exercise, UE/LE Strength taining/ROM  SLP Interventions    TR Interventions Recreation/leisure participation, Balance/Vestibular training, functional mobility, therapeutic activities, UE/LE strength/coordination, w/c mobility, community reintegration, pt/family education, adaptive equipment instruction/use, discharge planning, psychosocial support, relaxation training  SW/CM Interventions Discharge Planning, Barrister's clerk, Patient/Family Education    Team Discharge Planning: Destination: PT-Home ,OT- Home , SLP-  Projected Follow-up: PT-Home health PT, OT-  Home health OT (TBD), SLP-  Projected Equipment Needs: PT-To be determined, Wheelchair (measurements), Wheelchair cushion (measurements), Rolling walker with 5" wheels, OT- 3 in 1 bedside comode, Tub/shower bench, SLP-  Equipment Details: PT- , OT-will require BSC if going home to mom's house, TBD based on d/c plan Patient/family involved in discharge planning: PT- Patient,  OT-Patient, SLP-   MD ELOS: 7-10d Medical Rehab Prognosis:   Good Assessment: 38 y.o. male with history of DM type 2 with diabetic foot ulcer, morbid obesity, HTN, CKD, on chronic coumadin for recurrent DVT/PE, medical non-compliance; who was admitted  via MD office on 12/02 with fever chills, N/V and right foot pain. He was started on IV antibiotics for cellulitis right foot as well as IVF for AKI on CKD. MRI of right foot revealed skin ulcer on plantar surface 4th MTP with underlying abscess but no osteo or septic joint. Dr. Doran Durand was consulted for input and performed I and D at bedside with cultures. Cultures positive for MRSA. Patient developed recurrent fever with chills on 12/05 with progressive leucocytosis. Repeat MRI right foot on 12/06 revealed new abscess tracking medial plantar surface below 1st and 2nd MT with extensive edema due to cellulitis as well as myositis. He was taken to OR on 12/7 for I and D by Dr. Veverly Fells. Antibiotics narrowed to vancomycin. Patient continued to have abdominal discomfort with N/V. Stool negative for C diff. CT abdomen/pelvis with atelectasis but no signs of infection. Patient with complaints of right hip pain and ultrasound with superficial abscess. Pain has improved but he continued to have persistent leucocytosis and ID consulted for input. Dr. Megan Salon recommended I and D of superficial right buttock abscess as well as continuing Vancomycin alone.   Patient continued to have poor wound healing with wet gangrene and failed attempts at limb salvage. He underwent R-BKA with I and D right buttock abscess by Dr. Doran Durand on 37/14/15   Now requiring 24/7 Rehab RN,MD, as well as CIR level PT, OT and SLP.  Treatment team will focus on ADLs and mobility with goals set at Mod I  See Team Conference Notes for weekly updates to the plan of care

## 2013-12-29 NOTE — Progress Notes (Signed)
INITIAL NUTRITION ASSESSMENT  DOCUMENTATION CODES Per approved criteria  -Morbid Obesity   INTERVENTION: Continue 30 ml Prostat po TID, each supplement provides 100 kcal and 15 grams of protein.   Provide Glucerna Shake po once daily, each supplement provides 220 kcal and 10 grams of protein.  Encourage adequate PO intake.  NUTRITION DIAGNOSIS: Inadequate oral intake related to nausea as evidenced by meal completion of 10-50%.   Goal: Pt to meet >/= 90% of their estimated nutrition needs   Monitor:  PO intake, weight trends, labs, I/O's  Reason for Assessment: MST  38 y.o. male  Admitting Dx: Amputation of right lower extremity below knee  ASSESSMENT: Pt with history of DM type 2 with diabetic foot ulcer, morbid obesity, HTN, CKD, on chronic coumadin for recurrent DVT/PE, medical non-compliance; who was admitted via MD office on 12/02 with fever chills, N/V and right foot pain. He underwent R-BKA with I and D right buttock abscess on 12/22/13.  Pt reports having a decreased appetite and nausea which has been ongoing over the past 3 weeks. He does report it has been improving. Meal completion has been 10-50%. Pt's weight has been trending up. During acute hospitalization pt noted to have fluid retention which may contribute to weight gain. Pt reports he has been taking his Prostat. Pt was educated the importance of protein intake to help with healing. Pt agreeable to Glucerna Shake. Will order.   Pt with no observed significant fat or muscle mass loss.  Labs: Low GFR. High BUN and creatinine.  Height: Ht Readings from Last 1 Encounters:  12/10/13 6\' 2"  (1.88 m)    Weight: Wt Readings from Last 1 Encounters:  12/28/13 367 lb 11.6 oz (166.8 kg)    Ideal Body Weight: 178 lbs--adjusted for BKA  % Ideal Body Weight: 206%  Wt Readings from Last 10 Encounters:  12/28/13 367 lb 11.6 oz (166.8 kg)  12/26/13 385 lb 3.2 oz (174.726 kg)  12/09/13 338 lb 4.8 oz (153.452 kg)   08/14/13 353 lb 11.2 oz (160.437 kg)  06/03/13 338 lb 3.2 oz (153.407 kg)  05/20/13 340 lb (154.223 kg)  05/14/13 335 lb 1.6 oz (152 kg)  05/12/13 335 lb 9.6 oz (152.227 kg)  04/01/13 346 lb 4.8 oz (157.081 kg)  12/24/12 371 lb 11.2 oz (168.602 kg)    Usual Body Weight: 340 lbs  % Usual Body Weight: 108%  BMI:  Body mass index is 47.19 kg/(m^2). Morbid obesity  Estimated Nutritional Needs: Kcal: [id number] Protein: 130-150 grams Fluid: 2.4 - 2.6 L/day  Skin: +2 RLE, +1 LLE edema, incision right hip, incision right leg  Diet Order: Diet Carb Modified  EDUCATION NEEDS: -Education needs addressed   Intake/Output Summary (Last 24 hours) at 12/29/13 1233 Last data filed at 12/29/13 0700  Gross per 24 hour  Intake      0 ml  Output   1800 ml  Net  -1800 ml    Last BM: 12/22  Labs:   Recent Labs Lab 12/25/13 0519 12/26/13 0439 12/29/13 0748  NA 138 139 143  K 4.6 4.9 5.1  CL 109 109 111  CO2 18* 20 20  BUN 37* 32* 24*  CREATININE 2.24* 2.20* 1.92*  CALCIUM 8.0* 8.2* 8.2*  GLUCOSE 95 117* 72    CBG (last 3)   Recent Labs  12/28/13 2107 12/29/13 0702 12/29/13 1135  GLUCAP 91 70 82    Scheduled Meds: . amLODipine  10 mg Oral Daily  . carvedilol  6.25  mg Oral BID WC  . docusate sodium  100 mg Oral BID  . escitalopram  10 mg Oral QHS  . feeding supplement (PRO-STAT SUGAR FREE 64)  30 mL Oral TID WC  . insulin aspart  0-15 Units Subcutaneous TID WC  . insulin glargine  35 Units Subcutaneous QHS  . pantoprazole  40 mg Oral Daily  . pravastatin  20 mg Oral q1800  . senna-docusate  2 tablet Oral BID  . sorbitol  45 mL Oral Once  . sorbitol  60 mL Oral Q4H  . sorbitol, milk of mag, mineral oil, glycerin (SMOG) enema  960 mL Rectal Once  . vancomycin  1,250 mg Intravenous Q24H  . Warfarin - Pharmacist Dosing Inpatient   Does not apply q1800    Continuous Infusions:   Past Medical History  Diagnosis Date  . DVT (deep venous thrombosis) 09/2002     patient reports additional DVTs in '06 & '11 (unconfirmed)  . Pulmonary embolism 09/2002    treated with 6 months of warfarin  . Hypertension   . Chest pain, neg MI, normal coronaries by cath 02/18/2013  . Hyperlipidemia 02/19/2013  . Acute venous embolism and thrombosis of deep vessels of proximal lower extremity 07/19/2011  . Type I diabetes mellitus dx'd 2001  . GERD (gastroesophageal reflux disease)   . Diabetic ulcer of right foot   . CKD (chronic kidney disease) stage 3, GFR 30-59 ml/min 02/19/2013  . Nephrotic syndrome     Past Surgical History  Procedure Laterality Date  . Incision and drainage abscess  2007; 2015    "back"  . Cardiac catheterization  02/18/2013    normal coronaries  . I&d extremity Right 12/14/2013    Procedure: IRRIGATION AND DEBRIDEMENT RIGHT FOOT;  Surgeon: [name], MD;  Location: [place] [facility];  Service: Orthopedics;  Laterality: Right;  . Left heart catheterization with coronary angiogram N/A 02/18/2013    Procedure: LEFT HEART CATHETERIZATION WITH CORONARY ANGIOGRAM;  Surgeon: [name], MD;  Location: [facility] CATH LAB;  Service: Cardiovascular;  Laterality: N/A;  . Amputation Right 12/22/2013    Procedure: AMPUTATION BELOW KNEE;  Surgeon: [name], MD;  Location: [facility];  Service: Orthopedics;  Laterality: Right;  . Incision and drainage of wound Right 12/22/2013    Procedure: I&D RIGHT BUTTOCK;  Surgeon: [name], MD;  Location: [place];  Service: Orthopedics;  Laterality: Right;    Kallie Locks, MS, RD, LDN Pager # [phone] After hours/ weekend pager # [phone]

## 2013-12-29 NOTE — Progress Notes (Signed)
Physical Therapy Session Note  Patient Details  Name: [name] MRN: [id number] Date of Birth: Jul 21, 1975  Today's Date: 12/29/2013 PT Individual Time: 1300-1400 PT Individual Time Calculation (min): 60 min  Session 2 Time: 1630-1650 Time Calculation (min): 20 min  Short Term Goals: Week 1:  PT Short Term Goal 1 (Week 1): STGs = LTGs due to ELOS  Skilled Therapeutic Interventions/Progress Updates:    Session 1: Pt received sidelying in bed on bed pan, agreeable to participate in therapy w/ min encouragement. Pt agreed to attempt transfer to bedside commode. Pt able to move supine>sit w/ SBA, then perform scooting transfer bed>BSC w/ MinA to maintain positioning of RLE and for anterior lean to improve buttock clearance. After toileting on [facility] pt stood w/ MinA w/ bari RW for therapy tech to perform hygiene. While standing, therapist asked if pt needed to sit down and rest. Pt said "no, I'm going to lay down on the bed" and immediately moved walker and quickly laid down on bed in unsafe manner. Pt did not fall on floor or hit R residual limb on BSC. Pt educated on pivoting to sit in order to have more control over movement. Pt verbalized understanding but would benefit from further reinforcement. Pt rolled L/R  And moved to prone several times in bed for donning shorts and positioning chuck pads. Pt very fatigued at end of session. Pt left supine in bed w/ all needs within reach.   Session 2: Pt received sidelying in bed, agreeable to participate in therapy. Pt agreeable to attempt standing and ambulation. Upon initiation of sidelying to sit transfer, pt reported he had had bowel movement in his pants. Pt required TotalA for peri care for time management. Pt attempted x1 sit>stand, moved both hands to bari RW and attempted to push up from walker, pt began to slide forward and required TotalA to lie back on bed to prevent fall to floor. Pt able to sit up and walk hips back on bed to safe place with  MinA. Pt became nauseous and had emesis while sitting EOB. Pt able to complete scoot transfer to [facility] w/ +2 ModA after education on head-hips relationship. Pt left seated on [facility] w/ RN and girlfriend present.  Pt continues to be limited by excessive nausea/vomiting during session and difficulty holding bowels. Therapy has been unable to get clear picture of pt's current mobility status, so discharge plans and goals may have to be modified. Will continue to discuss with treatment team most appropriate discharge level.    Therapy Documentation Precautions:  Precautions Precautions: Fall Precaution Comments: New Rt BKA, with protector Required Braces or Orthoses: Knee Immobilizer - Right (prosthetic limbguard) Restrictions Weight Bearing Restrictions: Yes RLE Weight Bearing: Non weight bearing Other Position/Activity Restrictions: new right BKA Pain: Not rated, had pain in stomach from bowel movements. Repositioned in bed for comfort  See FIM for current functional status  Therapy/Group: Individual Therapy  [name]  [name], PT, DPT 12/29/2013, 7:33 AM

## 2013-12-29 NOTE — Consult Note (Addendum)
WOC wound consult note Reason for Consult: Consult requested for right buttock.  Pt was previously followed by Dr Doran Durand for I&D of abscess on 12/17, and he provided wound care orders after procedure. Wound type: Full thickness Measurement: .5X.5X4 cm Wound bed: Unable to visualize through narrow opening. Drainage (amount, consistency, odor) Mod amt tan drainage on packing, no odor Periwound: Intact skin surrounding Dressing procedure/placement/frequency: Continue present plan of care with Iodoform packing strip to absorb drainage and promote healing. Please re-consult if further assistance is needed.  Thank-you,  Julien Girt MSN, [place], [place], [place], [place]

## 2013-12-29 NOTE — Progress Notes (Addendum)
ANTICOAGULATION - Follow Up   Pharmacy Consult for Warfarin Indication: Hx recurrent DVTs No Known Allergies  Patient Measurements: Weight: (!) 367 lb 11.6 oz (166.8 kg)  Vital Signs: Temp: 98 F (36.7 C) (12/21 1500) Temp Source: Oral (12/21 1500) BP: 173/89 mmHg (12/21 1500) Pulse Rate: 101 (12/21 1500)  Labs:  Recent Labs  12/28/13 0417 12/29/13 0748  HGB  --  8.4*  HCT  --  27.1*  PLT  --  394  LABPROT 18.5* 24.5*  INR 1.52* 2.18*  CREATININE  --  1.92*   Estimated Creatinine Clearance: 86.4 mL/min (by C-G formula based on Cr of 1.92).  Assessment: 61 YOM on warfarin PTA for hx of recurrent DVTs.  INR trending higher 1.3 > 1.5 > 2.18 today. Larger increase in INR  Today. No bleeding, hgb 8.4, pltc wnl. Patient was on warfarin PTA for hx of recurrent DVTs. INR on admission was SUBtherapeutic and had a rapid rise when doses were initiated on 12/4. At this time, the cause of the rapid rise remains unknown - LFTs were wnl. Received Vit K 5 mg IV on 12/13. Coumadin resumed on 12/15 w/2mg  x3days, 5mg  12/18-20th.   Meds reviewed and he is not on any medications that may cause drug/drug interaction with Warfarin therapy.  Goal of Therapy:  INR 2-3     Plan:  1. Coumadin 2mg  PO x 1 tonight  2. F/u AM INR   Nicole Cella, RPh Clinical Pharmacist Pager: [phone] Thank you for allowing pharmacy to be part of this patients care team. 12/29/2013 3:19 PM

## 2013-12-29 NOTE — Progress Notes (Signed)
Occupational Therapy Session Note  Patient Details  Name: [name] MRN: [id number] Date of Birth: 07-06-1975  Today's Date: 12/29/2013 OT Individual Time: 0930-1015 OT Individual Time Calculation (min): 45 min    Short Term Goals: Week 1:  OT Short Term Goal 1 (Week 1): Pt will complete bathing with mod assist at sit > stand level OT Short Term Goal 2 (Week 1): Pt will complete LB dressing with mod assist at sit > stand level OT Short Term Goal 3 (Week 1): Pt will complete toilet transfer with max assist of 1 caregiver OT Short Term Goal 4 (Week 1): Pt will tolerate out of bed activity for full 60 minute session with no episodes of nausea or vomiting  Skilled Therapeutic Interventions/Progress Updates: ADL-retraining with focus on improved activity tolerance and adapted bathing and dressing.   Pt received supine in bed with emesis basin within reach, food tray untouched since breakfast.   Pt reported nausea without emesis but clarified that he felt basin might be necessary.   Pt re-oriented to treatment plan and method to include participation in bathing/dressing either at edge of bed or shower level.  Pt elected to bathe at edge of bed during this session.    With setup assist and extra time, pt completed upper body bathing and washed his face while sitting at edge of bed.   Pt assumed supine, HOB elevated, to wash peri-area and advised therapist for need of RN contact to reapply dressing to buttocks.   Pt required mod assist to wash buttocks and then requested bed pan to toilet, rejecting BSC offered as more beneficial for toileting with assisted transfer from therapist and Rehab Tech.    Pt able to complete bed mobility using bed rails but remained side lying to eliminate BM while awaiting RN care for dressing change.   Session terminated 15 min early with pt left in bed on bed pan and RN attending, awaiting completion of BM, delayed with unpredictable duration per pt.         Therapy  Documentation Precautions:  Precautions Precautions: Fall Precaution Comments: New Rt BKA, with protector Required Braces or Orthoses: Knee Immobilizer - Right (prosthetic limbguard) Restrictions Weight Bearing Restrictions: Yes RLE Weight Bearing: Non weight bearing Other Position/Activity Restrictions: new right BKA   General: General OT Amount of Missed Time: 15 Minutes   Pain: NO/deinies pain    See FIM for current functional status  Therapy/Group: Individual Therapy   Second session: Time: 1130-1200 Time Calculation (min):  30 min  Pain Assessment: No/denies pain  Skilled Therapeutic Interventions: ADL-retraining with focus on improved activity tolerance, clothing management, toileting.  Pt received supine in bed, dressed with shirt and underwear, side lying on his right side.   After review of goals and plan, pt again asked for bed pan to toilet and was productive for loose watery BM.   Pt able to manage his clothing but required assist for hygiene and replacement of bed pan, 2 more times.    Pt left on bed pan at end of session; girlfriend arrived at end of session and was advised on pt's ongoing need for assist with toileting.   See FIM for current functional status  Therapy/Group: Individual Therapy  Third session: Time: 1405-1420 Time Calculation (min):  15 min (make-up session)  Pain Assessment: No/denies pain*  Skilled Therapeutic Interventions: Therapeutic exercise with emphasis on upper body HEP using theraband, level 4 (blue)   Pt received prone in bed after wound care to buttocks.  OT observed and reinforced provider reporting to patient to ncourage bathing in shower using shower barrier to stimulate wound healing.    Pt's girlfriend [name] present during session and also reinforced showering.   Pt was then educated on HEP using theraband which he could perform sitting at edge of bed or supine.    OT demo'd chest press and presented theraband to pt who  attempted to duplicate exercise while side lying.    Repeated instruction and motivational counseling proved ineffective at provoking increased participation.   Pt was unable to manage band effectively and returned to resting position, side lying.   Pt was re-educated on his active role in therapy while acknowledging decline in function d/t poor sleep and symptoms of diarrhea.   Pt verbalized understanding and agreed to attempt increased participation throughout the remainder of his admission at [facility], as possible.   See FIM for current functional status  Therapy/Group: Individual Therapy  [place] 12/29/2013, 12:32 PM

## 2013-12-30 ENCOUNTER — Inpatient Hospital Stay (HOSPITAL_COMMUNITY): Payer: Self-pay

## 2013-12-30 ENCOUNTER — Inpatient Hospital Stay (HOSPITAL_COMMUNITY): Payer: Medicaid Other

## 2013-12-30 DIAGNOSIS — K5901 Slow transit constipation: Secondary | ICD-10-CM

## 2013-12-30 DIAGNOSIS — K5900 Constipation, unspecified: Secondary | ICD-10-CM | POA: Insufficient documentation

## 2013-12-30 LAB — GLUCOSE, CAPILLARY
GLUCOSE-CAPILLARY: 78 mg/dL (ref 70–99)
GLUCOSE-CAPILLARY: 63 mg/dL — AB (ref 70–99)
Glucose-Capillary: 79 mg/dL (ref 70–99)
Glucose-Capillary: 78 mg/dL (ref 70–99)
Glucose-Capillary: 63 mg/dL — ABNORMAL LOW (ref 70–99)
Glucose-Capillary: 82 mg/dL (ref 70–99)

## 2013-12-30 LAB — PROTIME-INR
INR: 2.75 — ABNORMAL HIGH (ref 0.00–1.49)
Prothrombin Time: 29.4 seconds — ABNORMAL HIGH (ref 11.6–15.2)

## 2013-12-30 MED ORDER — SENNOSIDES-DOCUSATE SODIUM 8.6-50 MG PO TABS
2.0000 | ORAL_TABLET | Freq: Three times a day (TID) | ORAL | Status: DC
  Administered 2013-12-30 – 2013-12-31 (×3): 2 via ORAL
  Filled 2013-12-30 (×3): qty 2

## 2013-12-30 MED ORDER — WARFARIN SODIUM 1 MG PO TABS
1.0000 mg | ORAL_TABLET | Freq: Once | ORAL | Status: AC
Start: 2013-12-30 — End: 2013-12-30
  Administered 2013-12-30: 1 mg via ORAL
  Filled 2013-12-30: qty 1

## 2013-12-30 MED ORDER — GLUCERNA SHAKE PO LIQD
237.0000 mL | Freq: Every day | ORAL | Status: DC
  Administered 2014-01-01: 237 mL via ORAL

## 2013-12-30 MED ORDER — TAMSULOSIN HCL 0.4 MG PO CAPS
0.4000 mg | ORAL_CAPSULE | Freq: Every day | ORAL | Status: DC
  Administered 2013-12-30 – 2014-01-20 (×22): 0.4 mg via ORAL
  Filled 2013-12-30 (×23): qty 1

## 2013-12-30 MED ORDER — SODIUM CHLORIDE 0.9 % IJ SOLN
10.0000 mL | INTRAMUSCULAR | Status: DC | PRN
  Administered 2013-12-30 – 2014-01-05 (×6): 10 mL
  Administered 2014-01-07: 20 mL
  Administered 2014-01-07 – 2014-01-19 (×18): 10 mL
  Filled 2013-12-30 (×25): qty 40

## 2013-12-30 MED ORDER — GLUCOSE-VITAMIN C 4-6 GM-MG PO CHEW
CHEWABLE_TABLET | ORAL | Status: AC
  Administered 2013-12-30: 8 g
  Filled 2013-12-30: qty 2

## 2013-12-30 MED ORDER — FUROSEMIDE 20 MG PO TABS
20.0000 mg | ORAL_TABLET | Freq: Every day | ORAL | Status: DC
  Administered 2013-12-30 – 2014-01-20 (×21): 20 mg via ORAL
  Filled 2013-12-30 (×23): qty 1

## 2013-12-30 NOTE — Progress Notes (Signed)
Patient information entered into [facility] system by Ileana Ladd, PT and reviewed by Daiva Nakayama, RN, [place], [place] Coordinator.  Information including medical coding and functional independence measure will be reviewed and updated through discharge.

## 2013-12-30 NOTE — Progress Notes (Signed)
Physical Therapy Make Up Session Note  Patient Details  Name: [name] MRN: [id number] Date of Birth: 1975-08-19  Today's Date: 12/30/2013 PT Individual Time: 1040-1055 PT Individual Time Calculation (min): 15 min   Short Term Goals: Week 1:  PT Short Term Goal 1 (Week 1): STGs = LTGs due to ELOS  Skilled Therapeutic Interventions/Progress Updates:    Patient received sitting in wheelchair. Patient with flat affect, minimal verbalizations throughout session. Patient performed L LE therex: hip flexion, knee flex/ext, ankle pumps x20 each. Patient refused to perform any therex with R LE due to "throbbing pain." Instructed patient in L LE gastroc/soleus and hamstring stretch with bed sheet to increase independence with stretching. Patient performed 3x30" each. Patient left sitting in wheelchair with all needs within reach.  Therapy Documentation Precautions:  Precautions Precautions: Fall Precaution Comments: New Rt BKA, with protector Required Braces or Orthoses: Knee Immobilizer - Right (limb guard) Restrictions Weight Bearing Restrictions: Yes RLE Weight Bearing: Non weight bearing Other Position/Activity Restrictions: new right BKA Pain: Pain Assessment Pain Assessment: 0-10 Pain Score: 8  Pain Type: Surgical pain Pain Location: Leg Pain Orientation: Right Pain Descriptors / Indicators: Throbbing Pain Frequency: Constant Pain Onset: On-going Pain Intervention(s): RN made aware;Repositioned Multiple Pain Sites: No Locomotion : Ambulation Ambulation/Gait Assistance: Not tested (comment)   See FIM for current functional status  Therapy/Group: Individual Therapy  [name], PT, DPT 12/30/2013, 12:07 PM

## 2013-12-30 NOTE — Progress Notes (Signed)
Peripherally Inserted Central Catheter/Midline Placement  The IV Nurse has discussed with the patient and/or persons authorized to consent for the patient, the purpose of this procedure and the potential benefits and risks involved with this procedure.  The benefits include less needle sticks, lab draws from the catheter and patient may be discharged home with the catheter.  Risks include, but not limited to, infection, bleeding, blood clot (thrombus formation), and puncture of an artery; nerve damage and irregular heat beat.  Alternatives to this procedure were also discussed.  PICC/Midline Placement Documentation        [name] 12/30/2013, 2:34 PM

## 2013-12-30 NOTE — Plan of Care (Signed)
Problem: RH KNOWLEDGE DEFICIT Goal: RH STG INCREASE KNOWLEDGE OF DIABETES Patient to verbalize s/s of hypo/hpyerglycemia, dietary, and medication management with min assist  Outcome: Progressing Type I DM

## 2013-12-30 NOTE — Significant Event (Signed)
Hypoglycemic Event  CBG:63  Treatment: 15 GM carbohydrate snack  Symptoms: None  Follow-up CBG: Time:2152 CBG Result:63  Possible Reasons for Event: Inadequate meal intake, Medication regimen: Lantus and Change in activity  Comments/MD notified: Notified Algis Liming, PA at 0940 she returned call at [phone] and gave order to 10 units of Lantus.     Colbe Viviano K  Remember to initiate Hypoglycemia Order Set & complete

## 2013-12-30 NOTE — Progress Notes (Signed)
Social Work  Social Work Assessment and Plan  Patient Details  Name: [name] MRN: [id number] Date of Birth: [birth year]  Today's Date: 12/30/2013  Problem List:  Patient Active Problem List   Diagnosis Date Noted  . Constipation   . Amputation of right lower extremity below knee 12/26/2013  . Diabetic ulcer of right foot   . Cellulitis and abscess   . Abscess of right hip 12/20/2013  . Cellulitis and abscess of right foot 12/12/2013  . GERD (gastroesophageal reflux disease) 08/14/2013  . Hyperlipidemia 02/19/2013  . S/P cardiac cath, 02/18/13, normal coronaries 02/19/2013  . CKD stage 3 due to type 1 diabetes mellitus 02/19/2013  . Failure to attend appointment 06/10/2012  . Nephrotic syndrome 02/12/2012  . Essential hypertension 07/24/2011  . Acute venous embolism and thrombosis of deep vessels of proximal lower extremity 07/19/2011  . Long term (current) use of anticoagulants 07/19/2011  . Foot ulcer, right 07/12/2011  . Back pain 07/12/2011  . DEEP VENOUS THROMBOPHLEBITIS, HX OF 07/28/2009  . PROTEINURIA, MILD 02/01/2007  . Morbid obesity 10/26/2005  . DM (diabetes mellitus) type I uncontrolled, periph vascular disorder 01/09/2002   Past Medical History:  Past Medical History  Diagnosis Date  . DVT (deep venous thrombosis) 09/2002    patient reports additional DVTs in '06 & '11 (unconfirmed)  . Pulmonary embolism 09/2002    treated with 6 months of warfarin  . Hypertension   . Chest pain, neg MI, normal coronaries by cath 02/18/2013  . Hyperlipidemia 02/19/2013  . Acute venous embolism and thrombosis of deep vessels of proximal lower extremity 07/19/2011  . Type I diabetes mellitus dx'd 2001  . GERD (gastroesophageal reflux disease)   . Diabetic ulcer of right foot   . CKD (chronic kidney disease) stage 3, GFR 30-59 ml/min 02/19/2013  . Nephrotic syndrome    Past Surgical History:  Past Surgical History  Procedure Laterality Date  . Incision and drainage abscess   2007; 2015    "back"  . Cardiac catheterization  02/18/2013    normal coronaries  . I&d extremity Right 12/14/2013    Procedure: IRRIGATION AND DEBRIDEMENT RIGHT FOOT;  Surgeon: [name], MD;  Location: [facility];  Service: Orthopedics;  Laterality: Right;  . Left heart catheterization with coronary angiogram N/A 02/18/2013    Procedure: LEFT HEART CATHETERIZATION WITH CORONARY ANGIOGRAM;  Surgeon: [name], MD;  Location: [facility] CATH LAB;  Service: Cardiovascular;  Laterality: N/A;  . Amputation Right 12/22/2013    Procedure: AMPUTATION BELOW KNEE;  Surgeon: [name], MD;  Location: [facility] [facility];  Service: Orthopedics;  Laterality: Right;  . Incision and drainage of wound Right 12/22/2013    Procedure: I&D RIGHT BUTTOCK;  Surgeon: [name], MD;  Location: [facility];  Service: Orthopedics;  Laterality: Right;   Social History:  reports that he has never smoked. He has never used smokeless tobacco. He reports that he drinks alcohol. He reports that he does not use illicit drugs.  Family / Support Systems Marital Status: Single How Long?: 4 yrs Patient Roles: Other (Comment) (boyfriend) Spouse/Significant Other: girlfriend (x 3 yrs), Margarita Mail @ (C) [phone] Children: none Other Supports: mother, Vandell Goulet @ [phone];  uncle, Hannan Mcnamar @ [phone] Anticipated Caregiver: GF, sister, mom Ability/Limitations of Caregiver: Mom works.  Sister works PT.  Nephews are 11 and 53 yo.  May decide to stay with his girlfriend. Caregiver Availability: Intermittent (gf works 12 hour dayshifts every weekend) Family Dynamics: Pt describes mother  and other family as very supportive. No concerns.  Social History Preferred language: English Religion: Baptist Cultural Background: NA Education: HS Read: Yes Write: Yes Employment Status: Unemployed Date Retired/Disabled/Unemployed: since April 2014 Legal Hisotry/Current Legal Issues: None Guardian/Conservator: None - per MD, pt capable of  making decisions on his own behalf   Abuse/Neglect    Emotional Status Pt's affect, behavior adn adjustment status: Pt lying in bed with eyes closed but answering basic questions.  His girlfriend is at bedside and continues to prompt him to speak louder to me.  Pt only answering with short responses and very disengaged.  No better response offered to girlfriend.  Concern there may be some emotional distress - will refer to neuropsychology when here next week. Recent Psychosocial Issues: Chronic health issues and loss of job last year. Pyschiatric History: None per pt and girlfriend Substance Abuse History: None  Patient / Family Perceptions, Expectations & Goals Pt/Family understanding of illness & functional limitations: Pt and girlfriend with basic understanding of his health issues and events resulting in BKA.  Basic awareness of functional limitations and need for CIR, however, limited appreciation of the limitations he will still have upon d/c.  i.e. wc level goals. Premorbid pt/family roles/activities: Pt was independent overall and active with church and at [employer]. Anticipated changes in roles/activities/participation: Pt will likely require some assistance upon d/c due to significant deconditioning and obesity.  Girlfriend likely to be the one to assume caregiver role. Pt/family expectations/goals: Pt hopeful he may not need a w/c upon d/c - have addressed this along with therapists.  Community Resources Express Scripts: None Premorbid Home Care/DME Agencies: None Transportation available at discharge: yes Resource referrals recommended: Neuropsychology, Support group (specify)  Discharge Planning Living Arrangements: Parent Support Systems: Spouse/significant other, Parent, Other relatives, Immunologist, Friends/neighbors Type of Residence: Private residence Insurance Resources: Teacher, adult education Resources: Family Support Financial Screen Referred: Previously  completed Living Expenses: Lives with family Money Management: Patient Does the patient have any problems obtaining your medications?: Yes (Describe) (no insurance) Home Management: pt was managing PTA Patient/Family Preliminary Plans: Pt currently making decisions about which home he will return to (home with mother vs with girlfriend) Barriers to Discharge: Steps, Finances Social Work Anticipated Follow Up Needs: HH/OP Expected length of stay: 10-12 days  Clinical Impression Unfortunate gentleman here following BKA but in poor health for some time.  Girlfriend present and encouraging, however, pt not very engaged during interview process.  Concerning there may be some emotional distress and will monitor for possible referral to neuropsychology.  Will follow for support and d/c planning needs.  Alajah Witman 12/30/2013, 3:46 PM

## 2013-12-30 NOTE — Progress Notes (Signed)
Dear Doctor: SwartzThis patient has been identified as a candidate for PICC for the following reason (s): drug pH or osmolality (causing phlebitis, infiltration in 24 hours) If you agree, please write an order for the indicated device. For any questions contact the Vascular Access Team at [phone] if no answer, please leave a message.  Thank you for supporting the early vascular access assessment program.

## 2013-12-30 NOTE — Progress Notes (Signed)
Occupational Therapy Session Note  Patient Details  Name: [name] MRN: [id number] Date of Birth: 04/30/1975  Today's Date: 12/30/2013 OT Individual Time: 0729-0829 OT Individual Time Calculation (min): 60 min    Short Term Goals: Week 1:  OT Short Term Goal 1 (Week 1): Pt will complete bathing with mod assist at sit > stand level OT Short Term Goal 2 (Week 1): Pt will complete LB dressing with mod assist at sit > stand level OT Short Term Goal 3 (Week 1): Pt will complete toilet transfer with max assist of 1 caregiver OT Short Term Goal 4 (Week 1): Pt will tolerate out of bed activity for full 60 minute session with no episodes of nausea or vomiting  Skilled Therapeutic Interventions/Progress Updates: ADL-retraining at shower level with focus on improved initiation, dynamic sitting balance, sit>stand supported, static standing balance, and safety awareness.   Pt received supine in bed, asleep,   With max vc and increased environmental stimulation, pt aroused to attend to self-care.   Pt able to rise to sitting at edge of bed unassisted but required extra time and setup/guarding to complete transfer to w/c.  With assist to position w/c at tub bench in bathroom pt stood for 20 seconds using RW to allow therapist to apply shower barrier at buttocks and over right residual limb.   Pt then completed squat pivot transfer to tub bench using grab bar and mod assist (lifting, pt = 80%).    Pt performed shower seated but was unable to reach buttocks and requested transfer to bed at end of session to more thoroughly clean buttocks.    RN arrived to provide care to wound after pt transferred from w/c to bed, with min assist to stabilize w/c and block left lower leg while pt completed lateral transfer.    Pt stated he had no clean clothing during session and he elected to wear hospital gown.   Pt consistently requires extra time to complete each task with rest breaks needed throughout session.   Pt's  self-expression is brief with flat affect throughout session although he has been polite and cooperative with therapists and staff present.     Therapy Documentation Precautions:  Precautions Precautions: Fall Precaution Comments: New Rt BKA, with protector Required Braces or Orthoses: Knee Immobilizer - Right (prosthetic limbguard) Restrictions Weight Bearing Restrictions: Yes RLE Weight Bearing: Non weight bearing Other Position/Activity Restrictions: new right BKA  Pain: Pain Assessment Pain Assessment: 0-10 Pain Score: 10-Worst pain ever Pain Type: Surgical pain Pain Location: Leg Pain Orientation: Left Pain Descriptors / Indicators: Aching Pain Frequency: Intermittent Pain Onset: On-going Patients Stated Pain Goal: 3 Pain Intervention(s): MD notified (Comment);Shower  See FIM for current functional status  Therapy/Group: Individual Therapy  Chantay Whitelock 12/30/2013, 8:55 AM

## 2013-12-30 NOTE — Progress Notes (Signed)
Patient has had poor po intake with low BS. Lantus decreased to 20 units at bedtime last pm. Will order PICC line placement per IV team recommendations. Patient continues to have moderate amount of drainage from hip wound and Vancomycin to continue thorough 12/28.

## 2013-12-30 NOTE — Progress Notes (Signed)
ANTICOAGULATION - Follow Up   Pharmacy Consult for Warfarin Indication: Hx recurrent DVTs No Known Allergies  Patient Measurements: Weight: (!) 357 lb 1.6 oz (161.979 kg)  Vital Signs: Temp: 97.4 F (36.3 C) (12/22 0448) Temp Source: Oral (12/22 0448) BP: 168/89 mmHg (12/22 0448) Pulse Rate: 103 (12/22 0448)  Labs:  Recent Labs  12/28/13 0417 12/29/13 0748 12/30/13 0645  HGB  --  8.4*  --   HCT  --  27.1*  --   PLT  --  394  --   LABPROT 18.5* 24.5* 29.4*  INR 1.52* 2.18* 2.75*  CREATININE  --  1.92*  --    Estimated Creatinine Clearance: 85 mL/min (by C-G formula based on Cr of 1.92).  Assessment: 76 YOM on warfarin PTA for hx of recurrent DVTs.  INR trending higher 1.3 > 1.5 > 2.18>2.75 today. Larger increase in INR continues overnight. No bleeding, hgb 8.4, pltc wnl. Patient was on warfarin PTA for hx of recurrent DVTs.   INR on admission was SUBtherapeutic and had a rapid rise when doses were initiated on 12/4. At this time, the cause of the rapid rise remains unknown - LFTs were wnl. Received Vit K 5 mg IV on 12/13.  Coumadin resumed on 12/15 w/2mg  x3days, 5mg  12/18-20th.  Meds reviewed and he is not on any medications that may cause drug/drug interaction with Warfarin therapy.  Goal of Therapy:  INR 2-3     Plan:  1. Coumadin 1mg  PO x 1 tonight  2. F/u AM INR  Erin Hearing PharmD., BCPS Clinical Pharmacist Pager [phone] 12/30/2013 8:20 AM

## 2013-12-30 NOTE — Progress Notes (Signed)
Physical Therapy Session Note  Patient Details  Name: [name] MRN: [id number] Date of Birth: 1975/03/16  Today's Date: 12/30/2013 PT Individual Time: 0930-1000 PT Individual Time Calculation (min): 30 min   Session 2 Time: 1300-1400 Time Calculation (min): 60  Short Term Goals: Week 1:  PT Short Term Goal 1 (Week 1): STGs = LTGs due to ELOS  Skilled Therapeutic Interventions/Progress Updates:    Session 1: Pt received seated in bed with breakfast tray in front of him. RN informed therapist that pt had had tray in front of him for 30 minutes without eating. Pt has had very poor PO intake on rehab unit, but agreed to eat some of his breakfast if he was then allowed to see rehab unit afterwards. Therapist allowed pt 30 minutes to eat breakfast (pt completed ~60% of tray). Session focused on wheelchair propulsion, initiation of sit<>stand and gait evaluation, orientation to rehab unit. Pt able to recall head-hips relationship education from yesterday and used strategy with mod cueing during scoot transfer. Pt required TotalA to don limbguard prosthetic on residual R limb. Pt able to propel w/c up to 75' at a time before needing to rest due to decreased endurance in BUE. Pt w/ x1 sit<>stand from w/c after multiple attempts, unable to attempt hop step. Pt stood for ~10 seconds before needing to sit down due to fatigue/weakness in LLE. Session ended in pt's room, where pt was left seated in w/c w/ all needs within reach.   Session 2: Pt received seated in w/c, agreeable to participate in therapy. Long discussion with pt about discharge planning and possible SNF discharge if upright mobility does not improve. Pt also educated on compliance with medication and bowel program and increasing PO intake so that he has more energy to participate with therapy. Pt verbalized understanding of all education, would benefit from further reinforcement. Pt able to propel w/c up to 75-100' w/ extra time. Pt w/ x3  sit<>stands during session w/ overall ModA, pt with improved form when pushing up with both hands from wheelchair arm rests and then transferring to walker one at a time. Pt able to take one hop step forward in hallway but immediately lost balance and had to lean to R against wall and transfer hand to hallway rail w/ MaxA to maintain balance. In room pt was able to perform stand pivot transfer w/ ModA w/ therapist providing assist to maintain upright posture and manage RW during turn. Pt benefited from repeated visual demonstrations of transfer by therapist prior to attempt. Pt left seated EOB w/ girlfriend present and all needs within reach.  Therapy Documentation Precautions:  Precautions Precautions: Fall Precaution Comments: New Rt BKA, with protector Required Braces or Orthoses: Knee Immobilizer - Right (prosthetic limbguard) Restrictions Weight Bearing Restrictions: Yes RLE Weight Bearing: Non weight bearing Other Position/Activity Restrictions: new right BKA Pain: Reported improved pain since yesterday, some phantom pain during AM session that was tolerable.  See FIM for current functional status  Therapy/Group: Individual Therapy  [name]  [name], PT, DPT 12/30/2013, 7:37 AM

## 2013-12-30 NOTE — Progress Notes (Addendum)
Subjective/Complaints: Some coughing "do I have pneumonia?". Takes antacids at home history of GERD Objective: Vital Signs: Blood pressure 168/89, pulse 103, temperature 97.4 F (36.3 C), temperature source Oral, resp. rate 18, weight 161.979 kg (357 lb 1.6 oz), SpO2 94 %. No results found. Results for orders placed or performed during the hospital encounter of 12/26/13 (from the past 72 hour(s))  Glucose, capillary     Status: Abnormal   Collection Time: 12/27/13 11:53 AM  Result Value Ref Range   Glucose-Capillary 118 (H) 70 - 99 mg/dL  Glucose, capillary     Status: Abnormal   Collection Time: 12/27/13  4:54 PM  Result Value Ref Range   Glucose-Capillary 141 (H) 70 - 99 mg/dL   Comment 1 Notify RN   Glucose, capillary     Status: Abnormal   Collection Time: 12/27/13  9:04 PM  Result Value Ref Range   Glucose-Capillary 134 (H) 70 - 99 mg/dL  Protime-INR     Status: Abnormal   Collection Time: 12/28/13  4:17 AM  Result Value Ref Range   Prothrombin Time 18.5 (H) 11.6 - 15.2 seconds   INR 1.52 (H) 0.00 - 1.49  Glucose, capillary     Status: None   Collection Time: 12/28/13  7:32 AM  Result Value Ref Range   Glucose-Capillary 93 70 - 99 mg/dL   Comment 1 Documented in Chart   Glucose, capillary     Status: Abnormal   Collection Time: 12/28/13 11:35 AM  Result Value Ref Range   Glucose-Capillary 106 (H) 70 - 99 mg/dL  Glucose, capillary     Status: Abnormal   Collection Time: 12/28/13  5:05 PM  Result Value Ref Range   Glucose-Capillary 102 (H) 70 - 99 mg/dL  Glucose, capillary     Status: None   Collection Time: 12/28/13  9:07 PM  Result Value Ref Range   Glucose-Capillary 91 70 - 99 mg/dL  Glucose, capillary     Status: None   Collection Time: 12/29/13  7:02 AM  Result Value Ref Range   Glucose-Capillary 70 70 - 99 mg/dL   Comment 1 Notify RN   CBC WITH DIFFERENTIAL     Status: Abnormal   Collection Time: 12/29/13  7:48 AM  Result Value Ref Range   WBC 13.8 (H)  4.0 - 10.5 K/uL   RBC 3.13 (L) 4.22 - 5.81 MIL/uL   Hemoglobin 8.4 (L) 13.0 - 17.0 g/dL   HCT 27.1 (L) 39.0 - 52.0 %   MCV 86.6 78.0 - 100.0 fL   MCH 26.8 26.0 - 34.0 pg   MCHC 31.0 30.0 - 36.0 g/dL   RDW 15.2 11.5 - 15.5 %   Platelets 394 150 - 400 K/uL   Neutrophils Relative % 84 (H) 43 - 77 %   Neutro Abs 11.5 (H) 1.7 - 7.7 K/uL   Lymphocytes Relative 7 (L) 12 - 46 %   Lymphs Abs 1.0 0.7 - 4.0 K/uL   Monocytes Relative 8 3 - 12 %   Monocytes Absolute 1.1 (H) 0.1 - 1.0 K/uL   Eosinophils Relative 1 0 - 5 %   Eosinophils Absolute 0.1 0.0 - 0.7 K/uL   Basophils Relative 0 0 - 1 %   Basophils Absolute 0.1 0.0 - 0.1 K/uL  Comprehensive metabolic panel     Status: Abnormal   Collection Time: 12/29/13  7:48 AM  Result Value Ref Range   Sodium 143 137 - 147 mEq/L   Potassium 5.1 3.7 -  5.3 mEq/L   Chloride 111 96 - 112 mEq/L   CO2 20 19 - 32 mEq/L   Glucose, Bld 72 70 - 99 mg/dL   BUN 24 (H) 6 - 23 mg/dL   Creatinine, Ser 1.92 (H) 0.50 - 1.35 mg/dL   Calcium 8.2 (L) 8.4 - 10.5 mg/dL   Total Protein 6.9 6.0 - 8.3 g/dL   Albumin 1.7 (L) 3.5 - 5.2 g/dL   AST 18 0 - 37 U/L   ALT 24 0 - 53 U/L   Alkaline Phosphatase 204 (H) 39 - 117 U/L   Total Bilirubin 0.3 0.3 - 1.2 mg/dL   GFR calc non Af Amer 43 (L) >90 mL/min   GFR calc Af Amer 50 (L) >90 mL/min    Comment: (NOTE) The eGFR has been calculated using the CKD EPI equation. This calculation has not been validated in all clinical situations. eGFR's persistently <90 mL/min signify possible Chronic Kidney Disease.    Anion gap 12 5 - 15  Protime-INR     Status: Abnormal   Collection Time: 12/29/13  7:48 AM  Result Value Ref Range   Prothrombin Time 24.5 (H) 11.6 - 15.2 seconds   INR 2.18 (H) 0.00 - 1.49  Glucose, capillary     Status: None   Collection Time: 12/29/13 11:35 AM  Result Value Ref Range   Glucose-Capillary 82 70 - 99 mg/dL  Glucose, capillary     Status: None   Collection Time: 12/29/13  4:28 PM  Result Value  Ref Range   Glucose-Capillary 72 70 - 99 mg/dL  Glucose, capillary     Status: None   Collection Time: 12/29/13  8:51 PM  Result Value Ref Range   Glucose-Capillary 91 70 - 99 mg/dL  Glucose, capillary     Status: None   Collection Time: 12/30/13  6:39 AM  Result Value Ref Range   Glucose-Capillary 78 70 - 99 mg/dL  Protime-INR     Status: Abnormal   Collection Time: 12/30/13  6:45 AM  Result Value Ref Range   Prothrombin Time 29.4 (H) 11.6 - 15.2 seconds   INR 2.75 (H) 0.00 - 1.49     HEENT: normal Cardio: RRR and no murmurs Resp: CTA B/L and unlabored GI: BS positive and nontender nondistended Extremity:  Edema left stump Skin:   Other Ace wrap on the left stump, left buttocks wound 1 cm diameter, 4-5 cm deep, draining serosanguineous, no odor Neuro: Alert/Oriented and Abnormal Motor decreased left knee flexion extension inhibited by pain Musc/Skel:  Other left BKA Gen NAD   Assessment/Plan: 1. Functional deficits secondary to Right BKA which require 3+ hours per day of interdisciplinary therapy in a comprehensive inpatient rehab setting. Physiatrist is providing close team supervision and 24 hour management of active medical problems listed below. Physiatrist and rehab team continue to assess barriers to discharge/monitor patient progress toward functional and medical goals. FIM: FIM - Bathing Bathing Steps Patient Completed: Chest, Right Arm, Left Arm, Abdomen, Front perineal area, Right upper leg, Left upper leg Bathing: 3: Mod-Patient completes 5-7 58f10 parts or 50-74%  FIM - Upper Body Dressing/Undressing Upper body dressing/undressing steps patient completed: Thread/unthread right sleeve of pullover shirt/dresss, Thread/unthread left sleeve of pullover shirt/dress, Put head through opening of pull over shirt/dress Upper body dressing/undressing: 0: Wears gown/pajamas-no public clothing FIM - Lower Body Dressing/Undressing Lower body dressing/undressing: 0: Wears  gInterior and spatial designer FIM - Toileting Toileting: 0: Activity did not occur  FIM - Toilet Transfers  Chief of Staff Devices: Engineer, civil (consulting), Insurance account manager Transfers: 4-To toilet/BSC: Min A (steadying Pt. > 75%), 4-From toilet/BSC: Min A (steadying Pt. > 75%)  FIM - Bed/Chair Transfer Bed/Chair Transfer Assistive Devices: HOB elevated, Bed rails Bed/Chair Transfer: 5: Supine > Sit: Supervision (verbal cues/safety issues), 5: Sit > Supine: Supervision (verbal cues/safety issues)  FIM - Locomotion: Wheelchair Locomotion: Wheelchair: 0: Activity did not occur FIM - Locomotion: Ambulation Locomotion: Ambulation: 0: Activity did not occur  Comprehension Comprehension Mode: Auditory Comprehension: 5-Understands complex 90% of the time/Cues < 10% of the time  Expression Expression Mode: Verbal Expression: 5-Expresses basic needs/ideas: With extra time/assistive device  Social Interaction Social Interaction: 6-Interacts appropriately with others with medication or extra time (anti-anxiety, antidepressant).  Problem Solving Problem Solving: 5-Solves basic 90% of the time/requires cueing < 10% of the time  Memory Memory: 5-Requires cues to use assistive device  Medical Problem List and Plan: 1. Functional deficits secondary to R-BKA, also severely deconditioned 2. DVT Prophylaxis/Anticoagulation: Pharmaceutical: Lovenox 3. Pain Management: Premedicate prior to therapy sessions. Continues to require IV dilaudid in addition to hydrocodone --will escalate to oxycodone prn and d/c dilaudid.  4. Mood: team to provide ego support. Will add lexapro for mood stabilization. LCSW to follow for evaluation and support. Will need psych follow up also.  5. Neuropsych: This patient is capable of making decisions on his own behalf. 6. Skin/Wound Care: Daily dressing changes to left buttock wound. Routine pressure relief measures. Monitor wound daily for healing. Maintain  adequate nutrition. .  7. Fluids/Electrolytes/Nutrition: Monitor I/O. Add protein supplements to help with low protein stores.  8. Reactive leucocytosis: Resolving. Recheck in am. Monitor wound and temperature curve daily.  9. DM type 2: Po intake remains poor. On Lantus to prevent hypoglycemic episodes. Will need to be changed to 70/30 prior to d/c home due to financial constraints. Will monitor BS with ac/hs checks and titrate insulin as indicated. Reinforce importance of medication compliance.  10. CKD: Monitor renal status closely as on IV Vancomycin. Avoid other nephrotoxic drugs. Will recheck lytes in am.  11. HTN: Monitor BP tid on norvasc. Off prinizide and lasix due to AKI on CKD. Baseline Cr- 1.78 but is stabilizing to 2.3 range.  12. Acute on chronic anemia: Low iron stores with high ferritin levels. Likely poor absorption. Transfused with 2 units on 12/17. Recheck labs on Monday.  13. Left buttock wound: S/p I & D 12/14--antibiotic Day # 8/14  14. Resting tachycardia: HR in 110 range. Multifactorial due to obesity  anemia as well as deconditioning. Will monitor for symptoms with activity. May need low dose BB.  15.  Atelectasis encurage IS  LOS (Days) 4 A FACE TO FACE EVALUATION WAS PERFORMED  See Beharry E 12/30/2013, 9:36 AM

## 2013-12-30 NOTE — Significant Event (Signed)
Hypoglycemic Event  CBG: 63  Treatment: 3 glucose tabs  Symptoms: None  Follow-up CBG: Time:2226 CBG Result:82  Possible Reasons for Event: Inadequate meal intake, Medication regimen: Lantus and Change in activity  Comments/MD notified: Patient refused 10 units of Lantus.    Shanecia Hoganson K  Remember to initiate Hypoglycemia Order Set & complete

## 2013-12-30 NOTE — Progress Notes (Signed)
Pt has not voided within 8 hr period. Pt unable to void with attempt. I&O cath pt at 1515 with volume of 425cc. Educated pt on purpose of I&O cathing with pt 'refusing' to listen. Pt very upset with need to cath. Will cont. To educate and monitor.

## 2013-12-30 NOTE — Care Management Note (Signed)
[facility] Individual Statement of Services  Patient Name:  [name]  Date:  12/30/2013  Welcome to the [facility].  Our goal is to provide you with an individualized program based on your diagnosis and situation, designed to meet your specific needs.  With this comprehensive rehabilitation program, you will be expected to participate in at least 3 hours of rehabilitation therapies Monday-Friday, with modified therapy programming on the weekends.  Your rehabilitation program will include the following services:  Physical Therapy (PT), Occupational Therapy (OT), 24 hour per day rehabilitation nursing, Therapeutic Recreaction (TR), Neuropsychology, Case Management (Social Worker), Rehabilitation Medicine, Nutrition Services and Pharmacy Services  Weekly team conferences will be held on Tuesdays to discuss your progress.  Your Social Worker will talk with you frequently to get your input and to update you on team discussions.  Team conferences with you and your family in attendance may also be held.  Expected length of stay: 10-12 days      Overall anticipated outcome: minimal assist  Depending on your progress and recovery, your program may change. Your Social Worker will coordinate services and will keep you informed of any changes. Your Social Worker's name and contact numbers are listed  below.  The following services may also be recommended but are not provided by the [facility] will be made to provide these services after discharge if needed.  Arrangements include referral to agencies that provide these services.  Your insurance has been verified to be:  Medicaid application pending Your primary doctor is:  Dr. Joni Reining  Pertinent information will be shared with your doctor  and your insurance company.  Social Worker:  [place], [place] or ([phone]   Information discussed with and copy given to patient by: Lennart Pall, 12/30/2013, 3:49 PM

## 2013-12-31 ENCOUNTER — Inpatient Hospital Stay (HOSPITAL_COMMUNITY): Payer: Self-pay | Admitting: Physical Therapy

## 2013-12-31 ENCOUNTER — Inpatient Hospital Stay (HOSPITAL_COMMUNITY): Payer: Medicaid Other

## 2013-12-31 ENCOUNTER — Inpatient Hospital Stay (HOSPITAL_COMMUNITY): Payer: Medicaid Other | Admitting: Physical Therapy

## 2013-12-31 ENCOUNTER — Encounter (HOSPITAL_COMMUNITY): Payer: Self-pay | Admitting: Physical Therapy

## 2013-12-31 LAB — GLUCOSE, CAPILLARY
GLUCOSE-CAPILLARY: 80 mg/dL (ref 70–99)
Glucose-Capillary: 70 mg/dL (ref 70–99)
Glucose-Capillary: 113 mg/dL — ABNORMAL HIGH (ref 70–99)
Glucose-Capillary: 81 mg/dL (ref 70–99)

## 2013-12-31 LAB — BASIC METABOLIC PANEL
ANION GAP: 5 (ref 5–15)
BUN: 21 mg/dL (ref 6–23)
CALCIUM: 7.8 mg/dL — AB (ref 8.4–10.5)
CHLORIDE: 109 meq/L (ref 96–112)
CO2: 21 mmol/L (ref 19–32)
Creatinine, Ser: 2.3 mg/dL — ABNORMAL HIGH (ref 0.50–1.35)
GFR calc non Af Amer: 35 mL/min — ABNORMAL LOW (ref >90)
GFR, EST AFRICAN AMERICAN: 40 mL/min — AB (ref >90)
Glucose, Bld: 86 mg/dL (ref 70–99)
Potassium: 4.1 mmol/L (ref 3.5–5.1)
SODIUM: 135 mmol/L (ref 135–145)

## 2013-12-31 LAB — CALCIUM, IONIZED: CALCIUM ION: 1.21 mmol/L (ref 1.12–1.23)

## 2013-12-31 LAB — PROTIME-INR
INR: 2.9 — AB (ref 0.00–1.49)
Prothrombin Time: 30.6 seconds — ABNORMAL HIGH (ref 11.6–15.2)

## 2013-12-31 LAB — VANCOMYCIN, TROUGH: VANCOMYCIN TR: 21.3 ug/mL — AB (ref 10.0–20.0)

## 2013-12-31 MED ORDER — INSULIN GLARGINE 100 UNIT/ML ~~LOC~~ SOLN
10.0000 [IU] | Freq: Every day | SUBCUTANEOUS | Status: DC
  Administered 2013-12-31 – 2014-01-06 (×6): 10 [IU] via SUBCUTANEOUS
  Filled 2013-12-31 (×8): qty 0.1

## 2013-12-31 MED ORDER — CALCIUM CARBONATE ANTACID 500 MG PO CHEW
1.0000 | CHEWABLE_TABLET | Freq: Three times a day (TID) | ORAL | Status: DC
  Administered 2013-12-31 – 2014-01-20 (×46): 200 mg via ORAL
  Filled 2013-12-31 (×64): qty 1

## 2013-12-31 MED ORDER — SENNOSIDES-DOCUSATE SODIUM 8.6-50 MG PO TABS
2.0000 | ORAL_TABLET | Freq: Two times a day (BID) | ORAL | Status: DC
  Administered 2013-12-31 – 2014-01-19 (×35): 2 via ORAL
  Filled 2013-12-31 (×38): qty 2
  Filled 2013-12-31: qty 1
  Filled 2013-12-31: qty 2

## 2013-12-31 MED ORDER — VANCOMYCIN HCL IN DEXTROSE 1-5 GM/200ML-% IV SOLN
1000.0000 mg | INTRAVENOUS | Status: AC
  Administered 2013-12-31 – 2014-01-04 (×5): 1000 mg via INTRAVENOUS
  Filled 2013-12-31 (×5): qty 200

## 2013-12-31 MED ORDER — CALCIUM CARBONATE ANTACID 420 MG PO CHEW: 420.0000 mg | CHEWABLE_TABLET | Freq: Three times a day (TID) | ORAL | Status: DC

## 2013-12-31 MED ORDER — ESCITALOPRAM OXALATE 20 MG PO TABS
20.0000 mg | ORAL_TABLET | Freq: Every day | ORAL | Status: DC
Start: 2013-12-31 — End: 2014-01-20
  Administered 2013-12-31 – 2014-01-20 (×21): 20 mg via ORAL
  Filled 2013-12-31 (×22): qty 1

## 2013-12-31 MED ORDER — WARFARIN SODIUM 1 MG PO TABS
1.0000 mg | ORAL_TABLET | Freq: Once | ORAL | Status: AC
  Administered 2013-12-31: 1 mg via ORAL
  Filled 2013-12-31 (×3): qty 1

## 2013-12-31 MED ORDER — VANCOMYCIN HCL IN DEXTROSE 1-5 GM/200ML-% IV SOLN: 1000.0000 mg | INTRAVENOUS | Status: DC

## 2013-12-31 NOTE — Progress Notes (Signed)
Physical Therapy Session Note  Patient Details  Name: [name] MRN: [id number] Date of Birth: [birth year]  Today's Date: 12/31/2013 PT Individual Time: [phone] (Make up session) PT Individual Time Calculation (min): 30 min   Short Term Goals: Week 1:  PT Short Term Goal 1 (Week 1): STGs = LTGs due to ELOS  Skilled Therapeutic Interventions/Progress Updates:  Make up session. 1:1. Pt received sitting in w/c, ready for therapy. Focus this session on UE/core strength and endurance. Pt req supervision for w/c propulsion with B UE 100'x1 and 40'x1 with min cues for improved posture in w/c and propulsion technique. Pt req min Ax1 person with second person to stabilize w/c to complete safe lateral scoot t/f w/c<>tx mat. Use of yoga blocks to target UE strength and pressure relief, pt with fair tolerance to 5 second holds x5 reps with 15second rest breaks between, pt unable to achieve full bottom clearance. While seated EOM without L LE support on floor, pt engaged in yellow weighted ball toss at trampoline for 39min x2 reps to target UE and core strength, distant supervision for sitting balance. Pt completing 44 tosses 1x and 52 tosses 2x. Pt req frequent seated rest throughout session due to fatigue. Pt left sitting in w/c at end of session w/ all needs in reach.   Therapy Documentation Precautions:  Precautions Precautions: Fall Precaution Comments: New Rt BKA, with protector Required Braces or Orthoses: Knee Immobilizer - Right (limb guard) Restrictions Weight Bearing Restrictions: Yes RLE Weight Bearing: Non weight bearing Other Position/Activity Restrictions: new right BKA  See FIM for current functional status  Therapy/Group: Individual Therapy  Gilmore Laroche 12/31/2013, 5:22 PM

## 2013-12-31 NOTE — Progress Notes (Signed)
Occupational Therapy Session Note  Patient Details  Name: [name] MRN: [id number] Date of Birth: August 06, 1975  Today's Date: 12/31/2013 OT Individual Time: 0900-1000 OT Individual Time Calculation (min): 60 min    Short Term Goals: Week 1:  OT Short Term Goal 1 (Week 1): Pt will complete bathing with mod assist at sit > stand level OT Short Term Goal 2 (Week 1): Pt will complete LB dressing with mod assist at sit > stand level OT Short Term Goal 3 (Week 1): Pt will complete toilet transfer with max assist of 1 caregiver OT Short Term Goal 4 (Week 1): Pt will tolerate out of bed activity for full 60 minute session with no episodes of nausea or vomiting  Skilled Therapeutic Interventions/Progress Updates:    Pt resting in bed upon arrival.  Pt requested to take shower but informed that since he had a PICC placed yesterday, he wouldn't be able to take shower today.  Pt sat EOB with supervision and performed scoot tranfser to w/c with tot A + 2 (min A and 2nd person to stabilize w/c). Pt completed bathing and dressing tasks at sink with sit<>stand to bathe buttocks and pull up pants.  Pt required tot A + 2 for sit<>stand (pt=50%). Pt required assistance with bathing buttocks and pulling up pants.  Pt requires extra time to initiate and complete tasks. Focus on activity tolerance, sit<>stand, standing balance, and safety awareness.  Therapy Documentation Precautions:  Precautions Precautions: Fall Precaution Comments: New Rt BKA, with protector Required Braces or Orthoses: Knee Immobilizer - Right (limb guard) Restrictions Weight Bearing Restrictions: Yes RLE Weight Bearing: Non weight bearing Other Position/Activity Restrictions: new right BKA Pain: Pain Assessment Pain Assessment: 0-10 Pain Score: 9  Pain Type: Surgical pain Pain Location: Leg Pain Orientation: Right Pain Descriptors / Indicators: Sharp;Throbbing Pain Frequency: Constant Pain Onset: On-going Pain  Intervention(s): Other (Comment);RN aware; repositioned   See FIM for current functional status  Therapy/Group: Individual Therapy  [name] 12/31/2013, 12:07 PM

## 2013-12-31 NOTE — Progress Notes (Signed)
ANTICOAGULATION CONSULT NOTE - Follow Up Consult  Pharmacy Consult for coumadin Indication: hx of DVT/PE  No Known Allergies  Patient Measurements: Weight: (!) 347 lb (157.398 kg) Heparin Dosing Weight:   Vital Signs: Temp: 98.1 F (36.7 C) (12/23 0435) Temp Source: Oral (12/23 0435) BP: 168/87 mmHg (12/23 0435) Pulse Rate: 103 (12/23 0435)  Labs:  Recent Labs  12/29/13 0748 12/30/13 0645 12/31/13 0500  HGB 8.4*  --   --   HCT 27.1*  --   --   PLT 394  --   --   LABPROT 24.5* 29.4* 30.6*  INR 2.18* 2.75* 2.90*  CREATININE 1.92*  --  2.30*    Estimated Creatinine Clearance: 69.8 mL/min (by C-G formula based on Cr of 2.3).   Medications:  Scheduled:  . amLODipine  10 mg Oral Daily  . carvedilol  6.25 mg Oral BID WC  . docusate sodium  100 mg Oral BID  . escitalopram  10 mg Oral QHS  . feeding supplement (GLUCERNA SHAKE)  237 mL Oral Q1500  . feeding supplement (PRO-STAT SUGAR FREE 64)  30 mL Oral TID WC  . furosemide  20 mg Oral Daily  . insulin aspart  0-15 Units Subcutaneous TID WC  . insulin glargine  20 Units Subcutaneous QHS  . pantoprazole  40 mg Oral Daily  . pravastatin  20 mg Oral q1800  . senna-docusate  2 tablet Oral TID AC  . sorbitol  45 mL Oral Once  . tamsulosin  0.4 mg Oral QPC supper  . vancomycin  1,250 mg Intravenous Q24H  . Warfarin - Pharmacist Dosing Inpatient   Does not apply q1800   Infusions:    Assessment: 38 yo male with hx of PE/DVT is currently on therapeutic coumadin but INR up to 2.9.   Goal of Therapy:  INR 2-3 Monitor platelets by anticoagulation protocol: Yes   Plan:  - coumadin 1 mg po x1 - INR in am  [name], [name] 12/31/2013,8:20 AM

## 2013-12-31 NOTE — Progress Notes (Addendum)
Occupational Therapy Session Note  Patient Details  Name: [name] MRN: [id number] Date of Birth: December 06, 1975  Today's Date: 12/31/2013 OT Co-Treatment Time: 1330-1400  OT Co-Treatment Time Calculation (min): 30 min  Short Term Goals: Week 1:  OT Short Term Goal 1 (Week 1): Pt will complete bathing with mod assist at sit > stand level OT Short Term Goal 2 (Week 1): Pt will complete LB dressing with mod assist at sit > stand level OT Short Term Goal 3 (Week 1): Pt will complete toilet transfer with max assist of 1 caregiver OT Short Term Goal 4 (Week 1): Pt will tolerate out of bed activity for full 60 minute session with no episodes of nausea or vomiting  Skilled Therapeutic Interventions/Progress Updates:  Co-Treatment with PT focusing on pain management using mirror therapy, functional transfers, functional w/c mobility, and overall activity tolerance/endurance. Patient received seated in w/c with 10/10 complaints of pain in right residual limb (notified RN) and girlfriend present. Patient washed hands and therapist wiped down equipment appropriately and prn. Patient then propelled self from room>therapy gym with multiple rest breaks. From here, patient transferred onto therapy mat, performing stand pivot transfer using RW. Patient sat in long sitting and used mirror therapy as a pain management technique. After mirror therapy, patient with 8/10 complaints of pain in right residual limb. Patient performed squat pivot transfer back to w/c with min assist and propelled self back to room.   Precautions:  Precautions Precautions: Fall Precaution Comments: New Rt BKA, with protector Required Braces or Orthoses: Knee Immobilizer - Right (limb guard) Restrictions Weight Bearing Restrictions: Yes RLE Weight Bearing: Non weight bearing Other Position/Activity Restrictions: new right BKA  See FIM for current functional status  Therapy/Group: Co-Treatment  Dhara Schepp 12/31/2013, 3:17  PM

## 2013-12-31 NOTE — Progress Notes (Signed)
ANTIBIOTIC CONSULT NOTE - FOLLOW UP  Pharmacy Consult for vancomycin Indication: Foot and gluteal wounds with drainage  No Known Allergies  Patient Measurements: Weight: (!) 347 lb (157.398 kg)  Vital Signs: Temp: 98.8 F (37.1 C) (12/23 1523) Temp Source: Oral (12/23 1523) BP: 147/78 mmHg (12/23 1523) Pulse Rate: 102 (12/23 1523) Intake/Output from previous day: 12/22 0701 - 12/23 0700 In: 490 [P.O.:480; I.V.:10] Out: 1725 [Urine:1725] Intake/Output from this shift: Total I/O In: 480 [P.O.:480] Out: 510 [Urine:510]  Labs:  Recent Labs  12/29/13 0748 12/31/13 0500  WBC 13.8*  --   HGB 8.4*  --   PLT 394  --   CREATININE 1.92* 2.30*   Estimated Creatinine Clearance: 69.8 mL/min (by C-G formula based on Cr of 2.3).  Recent Labs  12/31/13 1525  VANCOTROUGH 21.3*     Microbiology: Recent Results (from the past 720 hour(s))  Culture, Blood     Status: None   Collection Time: 12/09/13  4:28 PM  Result Value Ref Range Status   Organism ID, Bacteria NO GROWTH 5 DAYS  Final  Culture, Blood     Status: None   Collection Time: 12/09/13  4:44 PM  Result Value Ref Range Status   Organism ID, Bacteria NO GROWTH 5 DAYS  Final  Anaerobic culture     Status: None   Collection Time: 12/12/13  2:29 AM  Result Value Ref Range Status   Specimen Description ABSCESS RIGHT FOOT  Final   Special Requests NONE  Final   Gram Stain   Final    ABUNDANT WBC PRESENT,BOTH PMN AND MONONUCLEAR NO SQUAMOUS EPITHELIAL CELLS SEEN RARE GRAM POSITIVE COCCI IN PAIRS IN CLUSTERS Performed at [facility]    Culture   Final    NO ANAEROBES ISOLATED Performed at [facility]    Report Status 12/17/2013 FINAL  Final  Culture, routine-abscess     Status: None   Collection Time: 12/12/13  2:29 AM  Result Value Ref Range Status   Specimen Description ABSCESS RIGHT FOOT  Final   Special Requests NONE  Final   Gram Stain   Final    ABUNDANT WBC PRESENT,BOTH PMN AND  MONONUCLEAR NO SQUAMOUS EPITHELIAL CELLS SEEN RARE GRAM POSITIVE COCCI IN PAIRS IN CLUSTERS Performed at [facility]    Culture   Final    FEW METHICILLIN RESISTANT STAPHYLOCOCCUS AUREUS Note: RIFAMPIN AND GENTAMICIN SHOULD NOT BE USED AS SINGLE DRUGS FOR TREATMENT OF STAPH INFECTIONS. This organism DOES NOT demonstrate inducible Clindamycin resistance in vitro. CRITICAL RESULT CALLED TO, READ BACK BY AND VERIFIED WITH: DARLA T 12/6 @  945 BY REAMM Performed at [facility]    Report Status 12/14/2013 FINAL  Final   Organism ID, Bacteria METHICILLIN RESISTANT STAPHYLOCOCCUS AUREUS  Final      Susceptibility   Methicillin resistant staphylococcus aureus - MIC*    CLINDAMYCIN <=0.25 SENSITIVE Sensitive     ERYTHROMYCIN >=8 RESISTANT Resistant     GENTAMICIN <=0.5 SENSITIVE Sensitive     LEVOFLOXACIN 4 INTERMEDIATE Intermediate     OXACILLIN >=4 RESISTANT Resistant     PENICILLIN >=0.5 RESISTANT Resistant     RIFAMPIN <=0.5 SENSITIVE Sensitive     TRIMETH/SULFA <=10 SENSITIVE Sensitive     VANCOMYCIN 1 SENSITIVE Sensitive     TETRACYCLINE <=1 SENSITIVE Sensitive     * FEW METHICILLIN RESISTANT STAPHYLOCOCCUS AUREUS  Culture, blood (routine x 2)     Status: None   Collection Time: 12/12/13  7:03  PM  Result Value Ref Range Status   Specimen Description BLOOD RIGHT ARM  Final   Special Requests BOTTLES DRAWN AEROBIC AND ANAEROBIC 10CC  Final   Culture  Setup Time   Final    12/13/2013 01:04 Performed at [facility]    Culture   Final    NO GROWTH 5 DAYS Performed at [facility]    Report Status 12/19/2013 FINAL  Final  Culture, blood (routine x 2)     Status: None   Collection Time: 12/12/13  7:15 PM  Result Value Ref Range Status   Specimen Description BLOOD LEFT HAND  Final   Special Requests BOTTLES DRAWN AEROBIC AND ANAEROBIC 10CC  Final   Culture  Setup Time   Final    12/13/2013 01:04 Performed at [facility]    Culture    Final    NO GROWTH 5 DAYS Performed at [facility]    Report Status 12/19/2013 FINAL  Final  Culture, blood (routine x 2)     Status: None   Collection Time: 12/13/13  8:47 PM  Result Value Ref Range Status   Specimen Description BLOOD RIGHT ARM  Final   Special Requests BOTTLES DRAWN AEROBIC ONLY 10CC  Final   Culture  Setup Time   Final    12/14/2013 03:54 Performed at [facility]    Culture   Final    NO GROWTH 5 DAYS Performed at [facility]    Report Status 12/20/2013 FINAL  Final  Culture, blood (routine x 2)     Status: None   Collection Time: 12/13/13  9:03 PM  Result Value Ref Range Status   Specimen Description BLOOD RIGHT ARM  Final   Special Requests BOTTLES DRAWN AEROBIC ONLY 2CC  Final   Culture  Setup Time   Final    12/14/2013 03:54 Performed at [facility]    Culture   Final    NO GROWTH 5 DAYS Performed at [facility]    Report Status 12/20/2013 FINAL  Final  Culture, Urine     Status: None   Collection Time: 12/14/13 12:15 AM  Result Value Ref Range Status   Specimen Description URINE, CLEAN CATCH  Final   Special Requests NONE  Final   Culture  Setup Time   Final    12/14/2013 01:11 Performed at [facility] Performed at [facility]   Final   Culture NO GROWTH Performed at [facility]   Final   Report Status 12/15/2013 FINAL  Final  Surgical pcr screen     Status: Abnormal   Collection Time: 12/15/13 12:00 AM  Result Value Ref Range Status   MRSA, PCR NEGATIVE NEGATIVE Final   Staphylococcus aureus POSITIVE (A) NEGATIVE Final    Comment:        The Xpert SA Assay (FDA approved for NASAL specimens in patients over 71 years of age), is one component of a comprehensive surveillance program.  Test performance has been validated by [employer] for patients greater than or equal to 70 year old. It is not intended to diagnose infection nor  to guide or monitor treatment. RESULT CALLED TO, READ BACK BY AND VERIFIED WITH: C OGIN AJALA,RN 12/15/13 0226 BY RHOLMES   Culture, routine-abscess     Status: None   Collection Time: 12/15/13  1:09 AM  Result Value Ref Range Status   Specimen Description ABSCESS  RIGHT FOOT  Final   Special Requests MEDIAL PATIENT ON FOLLOWING ZOSYN VANCOMYCIN  Final   Gram Stain   Final    RARE WBC PRESENT,BOTH PMN AND MONONUCLEAR NO SQUAMOUS EPITHELIAL CELLS SEEN NO ORGANISMS SEEN Performed at [facility]    Culture   Final    NO GROWTH 3 DAYS Performed at [facility]    Report Status 12/18/2013 FINAL  Final  Anaerobic culture     Status: None   Collection Time: 12/15/13  1:09 AM  Result Value Ref Range Status   Specimen Description ABSCESS RIGHT FOOT  Final   Special Requests MEDIAL PATIENT ON FOLLOWING ZOSYN VANCOMYCIN  Final   Gram Stain   Final    RARE WBC PRESENT,BOTH PMN AND MONONUCLEAR NO SQUAMOUS EPITHELIAL CELLS SEEN NO ORGANISMS SEEN Performed at [facility]    Culture   Final    NO ANAEROBES ISOLATED Performed at [facility]    Report Status 12/20/2013 FINAL  Final  Clostridium Difficile by PCR     Status: None   Collection Time: 12/16/13  5:34 PM  Result Value Ref Range Status   C difficile by pcr NEGATIVE NEGATIVE Final  Anaerobic culture     Status: None   Collection Time: 12/22/13  6:36 PM  Result Value Ref Range Status   Specimen Description ABSCESS BUTTOCKS  Final   Special Requests NONE  Final   Gram Stain   Final    RARE WBC PRESENT, PREDOMINANTLY PMN NO SQUAMOUS EPITHELIAL CELLS SEEN NO ORGANISMS SEEN Performed at [facility]    Culture   Final    NO ANAEROBES ISOLATED Performed at [facility]    Report Status 12/27/2013 FINAL  Final  Culture, routine-abscess     Status: None   Collection Time: 12/22/13  6:36 PM  Result Value Ref Range Status   Specimen Description ABSCESS BUTTOCKS  Final   Special  Requests NONE  Final   Gram Stain   Final    RARE WBC PRESENT, PREDOMINANTLY PMN NO SQUAMOUS EPITHELIAL CELLS SEEN NO ORGANISMS SEEN Performed at [facility]    Culture   Final    FEW METHICILLIN RESISTANT STAPHYLOCOCCUS AUREUS Note: RIFAMPIN AND GENTAMICIN SHOULD NOT BE USED AS SINGLE DRUGS FOR TREATMENT OF STAPH INFECTIONS. This organism DOES NOT demonstrate inducible Clindamycin resistance in vitro. CRITICAL RESULT CALLED TO, READ BACK BY AND VERIFIED WITH: Armen Pickup [facility]  12/26/13 0950 BY SMITHERSJ Performed at [facility]    Report Status 12/26/2013 FINAL  Final   Organism ID, Bacteria METHICILLIN RESISTANT STAPHYLOCOCCUS AUREUS  Final      Susceptibility   Methicillin resistant staphylococcus aureus - MIC*    CLINDAMYCIN <=0.25 SENSITIVE Sensitive     ERYTHROMYCIN >=8 RESISTANT Resistant     GENTAMICIN <=0.5 SENSITIVE Sensitive     LEVOFLOXACIN 4 INTERMEDIATE Intermediate     OXACILLIN >=4 RESISTANT Resistant     PENICILLIN >=0.5 RESISTANT Resistant     RIFAMPIN <=0.5 SENSITIVE Sensitive     TRIMETH/SULFA <=10 SENSITIVE Sensitive     VANCOMYCIN 1 SENSITIVE Sensitive     TETRACYCLINE <=1 SENSITIVE Sensitive     * FEW METHICILLIN RESISTANT STAPHYLOCOCCUS AUREUS    Anti-infectives    Start     Dose/Rate Route Frequency Ordered Stop   12/27/13 1400  vancomycin (VANCOCIN) 1,250 mg in sodium chloride 0.9 % 250 mL IVPB     1,250 mg166.7 mL/hr over 90 Minutes Intravenous Every  24 hours 12/27/13 0912 01/06/14 1559   12/26/13 1815  vancomycin (VANCOCIN) IVPB 1000 mg/200 mL premix  Status:  Discontinued     1,000 mg200 mL/hr over 60 Minutes Intravenous  Once 12/26/13 1810 12/26/13 1911      Assessment: 61 you male with Right heel cellulitis and R-buttock abscess of the right foot on vancomycin (antibiotic day 9/14; stop date noted as 12/28) . A vancomycin trough today is 21.3.   12/7>>Rt. Foot abscess 12/14> Buttock abscess - few MRSA  Goal of Therapy:   Vancomycin trough: 10-20  Plan:  -Change vancomycin to 1000mg  IV q24hr (stop date 01/05/14) -Will continue to follow patient progress  Hildred Laser, Pharm D 12/31/2013 5:11 PM

## 2013-12-31 NOTE — Progress Notes (Signed)
Physical Therapy Session Note  Patient Details  Name: [name] MRN: [id number] Date of Birth: Jun 03, 1975  Today's Date: 12/31/2013 PT Co-Treatment Time: 1000-1030 (but PT co-treating from 1000-1100) Treatment Session 2: [id number] (but PT co-treating from 1300-1400) PT Co-Treatment Time Calculation (min): 30 min (but PT co-treating for 60 min total) Treatment Session 2: 30 min (but PT co-treating for 60 min total)  Short Term Goals: Week 1:  PT Short Term Goal 1 (Week 1): STGs = LTGs due to ELOS  Skilled Therapeutic Interventions/Progress Updates:    W/C Management: PT instructs pt in w/c propulsion with B UEs x 150' req verbal encouragement for this distance and increased time.   Therapeutic Activity: PT instructs pt in w/c push-ups 3 x 10 reps total - req encouragement to perform this activity and PT pushes posterior on pt's intact knee to assist pt in scooting back in w/c.  PT instructs pt in sit to stand x 2 reps total req min A x 2 each time and verbal cues for technique. First rep, pt uses bari RW; second rep, pt uses R wall rail and L bari RW.  PT instructs pt to attempt to take a hop with bari RW and he does 2 hops with close w/c follow x 1 and min A x 1.  PT sets pt up for mirror therapy while sitting up in w/c and introduces pt to this technique. Pt reports minimal change in phantom limb pain, but MT is only done for 5 minutes. Pt req repeated redirection back to focusing on mirror reflection and upon doing so states, "I know my leg is gone". PT explains the importance of the illusion and the importance of trying to tell himself that the reflection is his actual leg. Exercises completed: ankle pumps, LAQ, sliding towel towards/away form mirror, and lateral big toe taps on mirror.   Treatment Session 2: Neuromuscular Reeducation: PT instructs pt in mirror visual feedback therapy in long sit with lower extremity mirror box x 15-25 minutes. Focus is on completing a realistic visual  illusion with focus on relaxing verbal guidance from PT. PT instructs pt in massaging lotion to pt's L LE while visualizing R LE, PT massages lotion into pt's lower leg/foot while instructing pt to visualize R LE, ankle pumps x 25 reps, hip IR/ER with knee straight x 25 - focus on touching toe to mirror, mip IR/ER with knee bent and PT stabilizing pt's L foot, PT providing PROM to pt's L calf while instructing pt to visualize the R calf stretch. Pt reports phantom limb pain reduces from baseline level of 10/10 to 8/10, achieving the threshold for minimally clinical important difference on the numerical pain rating scale.   Therapeutic Activity: PT instructs pt in stand-pivot transfer from w/c to mat req min A x 2 with bari RW - pt takes multiple attempts rocking and fear limits him, but he demonstrates ability to complete this transfer. PT provides blocking of R knee in the front and OT assists in stabilizing the w/c.  PT instructs pt in scoot transfer mat to w/c req stabilization of w/c from OT and CGA - SBA from PT at the waist.   Pt's fear is his primary limiting factor, but his overall deconditioning, impaired balance, and morbid obesity also contribute to pt's difficulty with progress. Ace wrap used with anchor around pt's waist in order to keep R limb guard in place during hopping portion of PT. Pt reported he was falling after 2nd hop, and pt  was assisted back into w/c. Once pt sitting up in w/c, PT clarified with pt that he became fearful and then decided to sit and that his leg did NOT buckle. PT spoke with pt's girlfriend and explained that pt will need a ramp to enter the home.  Pt's phantom limb pain is responding to mirror visual feedback therapy, but he is a slow responder. Pt noticed a crack in his LimbGuard and so PT called Water engineer, who agrees to come out and take a look at pt's orthosis. [name] reports there is a slit in the orthotic that allows for expansion/reduction of  swelling, but agrees to come out and make sure that is what is going on. Continue per PT POC.   Therapy Documentation Precautions:  Precautions Precautions: Fall Precaution Comments: New Rt BKA, with protector Required Braces or Orthoses: Knee Immobilizer - Right (limb guard) Restrictions Weight Bearing Restrictions: Yes RLE Weight Bearing: Non weight bearing Other Position/Activity Restrictions: new right BKA Vital Signs: see flow sheet for data - vitals wnl for this pt's normal throughout PT session  Pain: Pain Assessment Pain Assessment: 0-10 Pain Score: 9  Pain Type: Surgical pain Pain Location: Leg Pain Orientation: Right Pain Descriptors / Indicators: Sharp;Throbbing Pain Frequency: Constant Pain Onset: On-going Pain Intervention(s): Other (Comment);Emotional support (mirror therapy) Multiple Pain Sites: Yes 2nd Pain Site Pain Score: 9 Pain Type: Phantom pain Pain Location: Leg Pain Orientation: Right Pain Descriptors / Indicators: Burning Pain Onset: Gradual Pain Intervention(s): Other (Comment) (mirror therapy) Treatment Session 2: Pt c/o 10/10 phantom limb pain; PT utilizes mirror therapy to decrease pain.   See FIM for current functional status  Therapy/Group: Co-Treatment with Herminio Heads during 1st session and with Mardene Celeste, OT during 2nd session  [facility] M 12/31/2013, 10:10 AM

## 2013-12-31 NOTE — Progress Notes (Signed)
Had low BS again last night and lantus decreased to 20 units. BS this am was 70 but RN reports minimal intake at lunch and supper. Will liberalize diet to regular to help with food choices and add snacks between meals. Will decrease lantus to 10 units at bedtime for now. Patient and girlfriend educated on importance of nutritional support to help with healing and energy levels.

## 2013-12-31 NOTE — Progress Notes (Signed)
Occupational Therapy Note  Patient Details  Name: [name] MRN: [id number] Date of Birth: 14-Feb-1975  Today's Date: 12/31/2013 OT Co-Treatment Time: 1030-1100 (cotx with PT-total time 1000-1100) OT Co-Treatment Time Calculation (min): 30 min  Pt c/o 9/10 pain in RLE (phantom pain); RN aware; repositioned Cotx with PT   Cotreatment with PT.  Pt propelled w/c to therapy gym.  Pt engaged in w/c pushups and educated on pressure relief.  Pt engaged in sit<>stand, standing balance, and taking steps (hopping). Pt required tot A + 2 for sit<>stand (pt=75%) X 3. Pt educated in mirror therapy.  Leotis Shames [facility] 12/31/2013, 12:09 PM

## 2013-12-31 NOTE — Progress Notes (Signed)
Recreational Therapy Assessment and Plan  Patient Details  Name: [name] MRN: [id number] Date of Birth: 01-30-1975 Today's Date: 12/31/2013  Rehab Potential: Good ELOS: 2 weeks   Assessment Clinical Impression: Problem List:  Patient Active Problem List   Diagnosis Date Noted  . Amputation of right lower extremity below knee 12/26/2013  . Diabetic ulcer of right foot   . Cellulitis and abscess   . Abscess of right hip 12/20/2013  . Leukocytosis 12/18/2013  . Diarrhea 12/18/2013  . Supratherapeutic INR 12/18/2013  . Hypoglycemia 12/17/2013  . Cellulitis and abscess of right foot 12/12/2013  . Sepsis 12/12/2013  . Severe obesity (BMI >= 40) 08/14/2013  . GERD (gastroesophageal reflux disease) 08/14/2013  . Hyperlipidemia 02/19/2013  . S/P cardiac cath, 02/18/13, normal coronaries 02/19/2013  . CKD stage 3 due to type 1 diabetes mellitus 02/19/2013  . Chest pain, neg MI, normal coronaries by cath 02/18/2013  . Bilateral leg edema 12/24/2012  . Swelling of thumb, left 10/02/2012  . Failure to attend appointment 06/10/2012  . Nephrotic syndrome 02/12/2012  . Right foot pain 02/09/2012  . Essential hypertension 07/24/2011  . Acute venous embolism and thrombosis of deep vessels of proximal lower extremity 07/19/2011  . Long term (current) use of anticoagulants 07/19/2011  . Foot ulcer, right 07/12/2011  . Back pain 07/12/2011  . DEEP VENOUS THROMBOPHLEBITIS, HX OF 07/28/2009  . PROTEINURIA, MILD 02/01/2007  . Morbid obesity 10/26/2005  . DM (diabetes mellitus) type I uncontrolled, periph vascular disorder 01/09/2002    Past Medical History:  Past Medical History  Diagnosis Date  . DVT (deep venous thrombosis) 09/2002    patient reports additional DVTs in '06 & '11 (unconfirmed)  . Pulmonary embolism 09/2002    treated with 6 months of warfarin  .  Hypertension   . Chest pain, neg MI, normal coronaries by cath 02/18/2013  . Hyperlipidemia 02/19/2013  . Acute venous embolism and thrombosis of deep vessels of proximal lower extremity 07/19/2011  . Type I diabetes mellitus dx'd 2001  . GERD (gastroesophageal reflux disease)   . Diabetic ulcer of right foot   . CKD (chronic kidney disease) stage 3, GFR 30-59 ml/min 02/19/2013  . Nephrotic syndrome    Past Surgical History:  Past Surgical History  Procedure Laterality Date  . Incision and drainage abscess  2007; 2015    "back"  . Cardiac catheterization  02/18/2013    normal coronaries  . I&d extremity Right 12/14/2013    Procedure: IRRIGATION AND DEBRIDEMENT RIGHT FOOT; Surgeon: [name], MD; Location: [facility]; Service: Orthopedics; Laterality: Right;  . Left heart catheterization with coronary angiogram N/A 02/18/2013    Procedure: LEFT HEART CATHETERIZATION WITH CORONARY ANGIOGRAM; Surgeon: [name], MD; Location: [facility] CATH LAB; Service: Cardiovascular; Laterality: N/A;  . Amputation Right 12/22/2013    Procedure: AMPUTATION BELOW KNEE; Surgeon: [name], MD; Location: [facility]; Service: Orthopedics; Laterality: Right;  . Incision and drainage of wound Right 12/22/2013    Procedure: I&D RIGHT BUTTOCK; Surgeon: [name], MD; Location: [facility]; Service: Orthopedics; Laterality: Right;    Assessment & Plan Clinical Impression: [name] is a 38 y.o. male with history of DM type 2 with diabetic foot ulcer, morbid obesity, HTN, CKD, on chronic coumadin for recurrent DVT/PE, medical non-compliance; who was admitted via MD office on 12/02 with fever chills, N/V and right foot pain. He was started on IV antibiotics for cellulitis right foot as well as IVF for AKI on CKD. MRI of right  foot revealed skin ulcer on plantar surface 4th MTP with underlying abscess but no osteo or septic joint. Dr.  Doran Durand was consulted for input and performed I and D at bedside with cultures. Cultures positive for MRSA. Patient developed recurrent fever with chills on 12/05 with progressive leucocytosis. Repeat MRI right foot on 12/06 revealed new abscess tracking medial plantar surface below 1st and 2nd MT with extensive edema due to cellulitis as well as myositis. He was taken to OR on 12/7 for I and D by Dr. Veverly Fells. Antibiotics narrowed to vancomycin. Patient continued to have abdominal discomfort with N/V. Stool negative for C diff. CT abdomen/pelvis with atelectasis but no signs of infection. Patient with complaints of right hip pain and ultrasound with superficial abscess. Pain has improved but he continued to have persistent leucocytosis and ID consulted for input. Dr. Megan Salon recommended I and D of superficial right buttock abscess as well as continuing Vancomycin alone.   Patient continued to have poor wound healing with wet gangrene and failed attempts at limb salvage. He underwent R-BKA with I and D right buttock abscess by Dr. Doran Durand on 12/22/13. He has had issues with depressed mood and difficulty dealing with amputation. ID recommends 14 days Vanc post op to treat buttock wound. Bio Tech consulted for [employer] device for limb protection. CBC done to day revealing drop in Hgb to 6.5 and patient to be transfused with 2 units PRBCs. Therapy ongoing with and patient limited by pain and weakness but is showing improvement in participation with activity. Patient transferred to CIR on 12/26/2013 .  Pt presents with decreased activity tolerance, decreased functional mobility, decreased balance, decreased coordination, decreased visual acuity, difficulty maintaining precautions Limiting pt's independence with leisure/community pursuits.   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 - Other (Comment) (drawing,  Air cabin crew) Other Leisure Interests: Television;Movies;Reading Leisure Participation Style: With Family/Friends Awareness of Community Resources: Good-identify 3 post discharge leisure resources Psychosocial / Spiritual Social interaction - Mood/Behavior: Cooperative Academic librarian Appropriate for Education?: Yes Recreational Therapy Orientation Orientation -Reviewed with patient: Available activity resources Strengths/Weaknesses Patient Strengths/Abilities: Willingness to participate Patient weaknesses: Physical limitations TR Patient demonstrates impairments in the following area(s): Edema;Endurance;Pain;Safety;Skin Integrity  Plan Rec Therapy Plan Is patient appropriate for Therapeutic Recreation?: Yes Rehab Potential: Good Treatment times per week: Min 1 tme per week >20 minutes Estimated Length of Stay: 2 weeks TR Treatment/Interventions: Adaptive equipment instruction;1:1 session;Balance/vestibular training;Functional mobility training;Community reintegration;Patient/family education;Therapeutic activities;Recreation/leisure participation;Therapeutic exercise;UE/LE Coordination activities;Wheelchair propulsion/positioning Recommendations for other services: Neuropsych  Recommendations for other services: Neuropsych  Discharge Criteria: Patient will be discharged from TR if patient refuses treatment 3 consecutive times without medical reason.  If treatment goals not met, if there is a change in medical status, if patient makes no progress towards goals or if patient is discharged from hospital.  The above assessment, treatment plan, treatment alternatives and goals were discussed and mutually agreed upon: by patient  [place] 12/31/2013, 3:14 PM

## 2014-01-01 ENCOUNTER — Inpatient Hospital Stay (HOSPITAL_COMMUNITY): Payer: Medicaid Other

## 2014-01-01 ENCOUNTER — Inpatient Hospital Stay (HOSPITAL_COMMUNITY): Payer: Medicaid Other | Admitting: Physical Therapy

## 2014-01-01 LAB — GLUCOSE, CAPILLARY
GLUCOSE-CAPILLARY: 122 mg/dL — AB (ref 70–99)
Glucose-Capillary: 70 mg/dL (ref 70–99)
Glucose-Capillary: 83 mg/dL (ref 70–99)
Glucose-Capillary: 92 mg/dL (ref 70–99)

## 2014-01-01 LAB — PROTIME-INR
INR: 3.26 — ABNORMAL HIGH (ref 0.00–1.49)
Prothrombin Time: 33.5 seconds — ABNORMAL HIGH (ref 11.6–15.2)

## 2014-01-01 MED ORDER — WARFARIN SODIUM 1 MG PO TABS
1.0000 mg | ORAL_TABLET | Freq: Once | ORAL | Status: AC
  Administered 2014-01-01: 1 mg via ORAL
  Filled 2014-01-01: qty 1

## 2014-01-01 NOTE — Progress Notes (Signed)
ANTICOAGULATION CONSULT NOTE - Follow Up Consult  Pharmacy Consult for coumadin Indication: hx of DVT/PE  No Known Allergies  Patient Measurements: Weight: (!) 373 lb 3.2 oz (169.282 kg) Heparin Dosing Weight:   Vital Signs: Temp: 98.6 F (37 C) (12/24 0444) Temp Source: Oral (12/24 0444) BP: 164/92 mmHg (12/24 0813) Pulse Rate: 100 (12/24 0444)  Labs:  Recent Labs  12/30/13 0645 12/31/13 0500 01/01/14 0535  LABPROT 29.4* 30.6* 33.5*  INR 2.75* 2.90* 3.26*  CREATININE  --  2.30*  --     Estimated Creatinine Clearance: 72.8 mL/min (by C-G formula based on Cr of 2.3).   Medications:  Scheduled:  . amLODipine  10 mg Oral Daily  . calcium carbonate  1 tablet Oral TID WC  . carvedilol  6.25 mg Oral BID WC  . escitalopram  20 mg Oral QHS  . feeding supplement (GLUCERNA SHAKE)  237 mL Oral Q1500  . feeding supplement (PRO-STAT SUGAR FREE 64)  30 mL Oral TID WC  . furosemide  20 mg Oral Daily  . insulin aspart  0-15 Units Subcutaneous TID WC  . insulin glargine  10 Units Subcutaneous QHS  . pantoprazole  40 mg Oral Daily  . pravastatin  20 mg Oral q1800  . senna-docusate  2 tablet Oral BID  . sorbitol  45 mL Oral Once  . tamsulosin  0.4 mg Oral QPC supper  . vancomycin  1,000 mg Intravenous Q24H  . Warfarin - Pharmacist Dosing Inpatient   Does not apply q1800   Infusions:    Assessment: 38 yo male with hx of DVT/PE is currently on supratherapeutic coumadin probably due to higher doses that he got previously. INR today up to 3.26.   Goal of Therapy:  INR 2-3 Monitor platelets by anticoagulation protocol: Yes   Plan:  - coumadin 1mg  po x1 - INR in am  [name], [name] 01/01/2014,8:27 AM

## 2014-01-01 NOTE — Progress Notes (Signed)
Social Work Patient ID: [name], male   DOB: [birth year], 38 y.o.   MRN: [id number]   Have reviewed team conference yesterday and today with pt and girlfriend.  Both aware and agreeable with targeted d/c date of 1/1.  Have stressed to both that therapists feel very strongly that pt will require a ramp at home for entry. Followed up again with pt today to see if they had talked any further about how they will get a ramp.  Pt reports OT provided him with information on local rental ramp complany ([employer]).  I requested that he contact them today and discuss costs of renting.  He states that he will, however, he remains very disengaged and noncommittal.   Will plan to follow up first thing Monday am to see if any contact about ramp even made.  Have also referred pt to be seen by neuropsychology on Monday.  Continue to follow.  Djibril Glogowski, LCSW

## 2014-01-01 NOTE — Patient Care Conference (Signed)
Inpatient RehabilitationTeam Conference and Plan of Care Update Date: 12/30/2013   Time: 2:05 PM    Patient Name: [name]      Medical Record Number: [id number]  Date of Birth: 1975/06/01 Sex: Male         Room/Bed: [id number] Payor Info: Payor: /    Admitting Diagnosis: narendra r bka  Admit Date/Time:  12/26/2013  5:38 PM Admission Comments: No comment available   Primary Diagnosis:  Amputation of right lower extremity below knee Principal Problem: Amputation of right lower extremity below knee  Patient Active Problem List   Diagnosis Date Noted  . Constipation   . Amputation of right lower extremity below knee 12/26/2013  . Diabetic ulcer of right foot   . Cellulitis and abscess   . Abscess of right hip 12/20/2013  . Cellulitis and abscess of right foot 12/12/2013  . GERD (gastroesophageal reflux disease) 08/14/2013  . Hyperlipidemia 02/19/2013  . S/P cardiac cath, 02/18/13, normal coronaries 02/19/2013  . CKD stage 3 due to type 1 diabetes mellitus 02/19/2013  . Failure to attend appointment 06/10/2012  . Nephrotic syndrome 02/12/2012  . Essential hypertension 07/24/2011  . Acute venous embolism and thrombosis of deep vessels of proximal lower extremity 07/19/2011  . Long term (current) use of anticoagulants 07/19/2011  . Foot ulcer, right 07/12/2011  . Back pain 07/12/2011  . DEEP VENOUS THROMBOPHLEBITIS, HX OF 07/28/2009  . PROTEINURIA, MILD 02/01/2007  . Morbid obesity 10/26/2005  . DM (diabetes mellitus) type I uncontrolled, periph vascular disorder 01/09/2002    Expected Discharge Date: Expected Discharge Date: 01/09/14  Team Members Present: Physician leading conference: Dr. Alysia Penna Social Worker Present: Lennart Pall, LCSW Nurse Present: Elliot Cousin, RN PT Present: Canary Brim, PT OT Present: [name], OT;Patricia Lissa Hoard, OT Other (Discipline and Name): Danne Baxter, RN [facility]) PPS Coordinator present : Daiva Nakayama, RN, CRRN   Current Status/Progress Goal Weekly Team Focus  Medical   deconditioning, possible depression, poor appetite  home d/c  adjust bowel meds   Bowel/Bladder   Pt continent of bladder. 12-22 0200 pt was I&O cathed for 400 due to unable to void and over 8hrs. Pt having continous leak of watery stool. Sorbital 12-21 HS with large loose watery BM. Pt refusing all stool softeners  manage b/b Min assist-  encourgae double void, encourgae stool softeners to empty out bowels   Swallow/Nutrition/ Hydration             ADL's   Mod A with transfers (bed, tub bench), Max A toileting, Mod A bathing, Supervision upper body B & D  Mod I for ADL (B&D and toileting) at w/c level, supervision with tub transfers  Endurance, transfers, dynamic sitting/standing balance, AE/DME training   Mobility   MinA overall for bed mobility, transfers bed<>w/c, w/c propulsion. +2A for sit<>stand, unable to initiate gait training. Limited by decreased muscular endurance, decreased strength in LLE and BUE  S for ambulation up to 25, S for car transfers, mod (I) for bed mobility and bed<>chair transfers  Upright tolerance, endurance, participation, LLE strength, transfer technique   Communication             Safety/Cognition/ Behavioral Observations            Pain   Pt complains of constant pain to stump- oxycodone 15mg  q4hrs PRN, robaxion 500mg  q6hrs PRN, ultram 50mg  q6PRN  4 or less  medicate as needed and prior to therapy   Skin   Pt has old abcess site  to right lower back-packed with iodofrom and foam on top; skin tears to thighs with guaze inplace. stump with staples and stump wrap.  prevent infection and dressing care with max assist; total for abcess site  cont. to chnage dressings as scheduled; monitor s/s of infection.     Rehab Goals Patient on target to meet rehab goals: Yes *See Care Plan and progress notes for long and short-term goals.  Barriers to Discharge: refusing certain aspects of care    Possible  Resolutions to Barriers:  cont rehab    Discharge Planning/Teaching Needs:  home likely with girlfriend who can assist most days;  other family may need to assist on days gf works      Team Discussion:  Very deconditioned; pain; n/v and poor po.  Responses usually delayed.  ?depression.  Hoping for mod i w/c goals.  Feel pt will have to have a ramp at home - no question about this.    Revisions to Treatment Plan:  Add neuropsych for next week for coping.   Continued Need for Acute Rehabilitation Level of Care: The patient requires daily medical management by a physician with specialized training in physical medicine and rehabilitation for the following conditions: Daily direction of a multidisciplinary physical rehabilitation program to ensure safe treatment while eliciting the highest outcome that is of practical value to the patient.: Yes Daily medical management of patient stability for increased activity during participation in an intensive rehabilitation regime.: Yes Daily analysis of laboratory values and/or radiology reports with any subsequent need for medication adjustment of medical intervention for : Other;Post surgical problems  [name] 01/01/2014, 11:15 AM

## 2014-01-01 NOTE — Progress Notes (Signed)
Occupational Therapy Session Note  Patient Details  Name: [name] MRN: [id number] Date of Birth: 06/06/1975  Today's Date: 01/01/2014 OT Individual Time: 0830-1000 OT Individual Time Calculation (min): 90 min    Short Term Goals: Week 1:  OT Short Term Goal 1 (Week 1): Pt will complete bathing with mod assist at sit > stand level OT Short Term Goal 2 (Week 1): Pt will complete LB dressing with mod assist at sit > stand level OT Short Term Goal 3 (Week 1): Pt will complete toilet transfer with max assist of 1 caregiver OT Short Term Goal 4 (Week 1): Pt will tolerate out of bed activity for full 60 minute session with no episodes of nausea or vomiting  Skilled Therapeutic Interventions/Progress Updates: ADL-retraining with focus on improved safety awareness, transfers, static standing, and effective use of DME.   Pt received supine in bed, finishing his breakfast.   Pt advised on plan to complete all ADL within 90 min time frame, allowing pt ample time to perform bed mobility and transfers as needed due to his slower pace with performing BADL.   Pt acknowledged plan and requested contact with psychiatrist for assist with mood/adjustment to his current medical status and delayed finishing his meal to begin planned B&D  task.   With extra time, pt eventually rose to sit at edge of bed and transfer to w/c.   Despite OT recommendation to maintain use of left sock with grippers to maintain contact with floor, pt requested assist to remove sock as he was unable to reach to his foot or bend hip/knee adequately.   Pt then completed transfer to w/c with OT blocking left foot during lateral scoot.    After OT provided water barrier setup to pick line at RUE and cover for RLE residual limb, pt was escorted to bathroom and w/c was positioned at grab bar near toilet.   Pt was challenged to rise to stand to allow OT to apply water barrier to wound on buttocks.   With OT providing min-mod lift assist at both  hips, pt attempted sit>stand but collapsed into chair as his left foot slipped, causing right residual limb to contact floor during recovery/descent to chair.   Pt reported pain at residual limb but stated he would continue with planned shower.   OT provided setup assist with w/c positioned at tub bench/grab bar however pt reconsidered completing transfer to bench and requested change in plan to perform bathing supine.    Pt was assisted with washing buttocks thoroughly while awaiting RN for wound care and PA to assess RLE.   Pt left supine in bed at end of session, call light within reach.     Therapy Documentation Precautions:  Precautions Precautions: Fall Precaution Comments: New Rt BKA, with protector Required Braces or Orthoses: Knee Immobilizer - Right (limb guard) Restrictions Weight Bearing Restrictions: Yes RLE Weight Bearing: Non weight bearing Other Position/Activity Restrictions: new right BKA   Vital Signs: Therapy Vitals BP: (!) 164/92 mmHg  Pain: Pain Assessment Pain Score: 6  Pain Location: Leg  See FIM for current functional status  Therapy/Group: Individual Therapy  [name] 01/01/2014, 11:23 AM

## 2014-01-01 NOTE — Plan of Care (Signed)
Problem: RH SKIN INTEGRITY Goal: RH STG SKIN FREE OF INFECTION/BREAKDOWN Skin to remain free from infection/breakdown while on rehab with max assist  Outcome: Progressing Skin tears to posterior area. Pt unable to access

## 2014-01-01 NOTE — Progress Notes (Signed)
Physical Therapy Session Note  Patient Details  Name: Lonas Calver MRN: [id number] Date of Birth: 1975-09-28  Today's Date: 01/01/2014 PT Individual Time: 1300-1430 PT Individual Time Calculation (min): 90 min   Short Term Goals: Week 1:  PT Short Term Goal 1 (Week 1): STGs = LTGs due to ELOS  Skilled Therapeutic Interventions/Progress Updates:    Therapeutic Activity: PT instructs pt in L side lie to sit transfer from flat bed req SBA and in scoot transfer without SB from bed to w/c to R req CGA and verbal cues for technique and safety.  Once in the gym, pt reports he needs to urinate. PT obtains a fresh urinal and instructs pt in chair push-ups so that PT could provide tot A to doff and don underwear and shorts. Pt urinates with set up assist, using a urinal while up in w/c.  PT instructs pt in sit to stand from w/c with bari RW and min A from PT in front to block L leg and CGA from PT Tech in the back for safety, then pt pivots with RW req CGA. Once on edge of mat, pt and PT notices serous fluid on edge of mat and pt reports his skin tear bandage has come off. Pt transfers from sit EOM to prone on mat mod I and PT obtains nurse and she redresses pt's skin tear.  PT instructs pt in prone to quadruped req verbal cues for technique and min A-CGA to obtain quadruped. PT instructs pt in transferring quadruped with hands flat to on elbows x 2 reps. Pt then req a rest in R side lie.   W/C Management: PT instructs pt in w/c propulsion on level surface in controlled environment x 150' req increased time and SBA/encouragement for this distance.  PT instructs pt in ascending ramp req min A to get up and verbal cues to push both arms at the same time. PT instructs pt in descending ramp req CGA for safety - halfway down, pt lets w/c run free. PT instructs pt that he must control w/c so as not to accidentally knock anyone over.   Pt demonstrated improvement in stand-pivot transfer, evidenced by ability  to complete this transfer on the first attempt by pushing up from B armrests. Pt continues to have fear and fatigue quickly, requiring rest breaks, but pt is able to initiate w/c management on a ramp and initiate quadruped activities on the mat. Continue per PT POC.   Therapy Documentation Precautions:  Precautions Precautions: Fall Precaution Comments: New Rt BKA, with protector Required Braces or Orthoses: Knee Immobilizer - Right (limb guard) Restrictions Weight Bearing Restrictions: Yes RLE Weight Bearing: Non weight bearing Other Position/Activity Restrictions: new right BKA  Pain: Pain Assessment Pain Assessment: 0-10 Pain Score: 10-Worst pain ever Pain Type: Surgical pain Pain Location: Leg Pain Orientation: Right Pain Descriptors / Indicators: Throbbing Pain Onset: On-going Pain Intervention(s): Medication (See eMAR) Multiple Pain Sites: Yes 2nd Pain Site Pain Score: 10 Pain Type: Phantom pain Pain Location: Leg Pain Orientation: Right Pain Descriptors / Indicators: Burning Pain Frequency: Intermittent Pain Onset: Gradual Pain Intervention(s): Other (Comment) (mirror therapy)  See FIM for current functional status  Therapy/Group: Individual Therapy with PT Dorris Singh as +2  [facility] M 01/01/2014, 1:21 PM

## 2014-01-01 NOTE — Plan of Care (Signed)
Problem: RH PAIN MANAGEMENT Goal: RH STG PAIN MANAGED AT OR BELOW PT'S PAIN GOAL Less than 3 out of 10  Outcome: Not Progressing Reports pain as 9

## 2014-01-02 LAB — PROTIME-INR
INR: 3.4 — ABNORMAL HIGH (ref 0.00–1.49)
PROTHROMBIN TIME: 34.6 s — AB (ref 11.6–15.2)

## 2014-01-02 LAB — GLUCOSE, CAPILLARY
GLUCOSE-CAPILLARY: 96 mg/dL (ref 70–99)
Glucose-Capillary: 97 mg/dL (ref 70–99)
Glucose-Capillary: 139 mg/dL — ABNORMAL HIGH (ref 70–99)

## 2014-01-02 NOTE — Progress Notes (Signed)
ANTICOAGULATION CONSULT NOTE - Follow Up Consult  Pharmacy Consult for coumadin Indication: hx of DVT/PE  No Known Allergies  Patient Measurements: Weight: (!) 373 lb 3.2 oz (169.282 kg)   Vital Signs: Temp: 98.9 F (37.2 C) (12/25 0536) Temp Source: Oral (12/25 0536) BP: 150/84 mmHg (12/25 0536) Pulse Rate: 97 (12/25 0536)  Labs:  Recent Labs  12/31/13 0500 01/01/14 0535 01/02/14 0532  LABPROT 30.6* 33.5* 34.6*  INR 2.90* 3.26* 3.40*  CREATININE 2.30*  --   --     Estimated Creatinine Clearance: 72.8 mL/min (by C-G formula based on Cr of 2.3).  Assessment: 38 yo male with hx of DVT/PE is currently on supratherapeutic coumadin. Likely due to 5mg  x 3 days 12/18-12/20. INR still with slight upward trend to 3.4 (appears to be plateauing). Noted low doses given past 3 days. No bleeding noted. Last CBC 12/21.   Goal of Therapy:  INR 2-3 Monitor platelets by anticoagulation protocol: Yes   Plan:  - No coumadin tonight - Daily INR  Sherlon Handing, PharmD, BCPS Clinical pharmacist, pager [phone] 01/02/2014,10:12 AM

## 2014-01-02 NOTE — Progress Notes (Signed)
Subjective/Complaints: Bumped his right stump yesterday, less pain today. He does not want me to check it today because he doesn't want to remove the stump socking yet Objective: Vital Signs: Blood pressure 150/84, pulse 97, temperature 98.9 F (37.2 C), temperature source Oral, resp. rate 17, weight 169.282 kg (373 lb 3.2 oz), SpO2 100 %. No results found. Results for orders placed or performed during the hospital encounter of 12/26/13 (from the past 72 hour(s))  Glucose, capillary     Status: None   Collection Time: 12/30/13 12:22 PM  Result Value Ref Range   Glucose-Capillary 79 70 - 99 mg/dL  Glucose, capillary     Status: None   Collection Time: 12/30/13  5:03 PM  Result Value Ref Range   Glucose-Capillary 78 70 - 99 mg/dL  Glucose, capillary     Status: Abnormal   Collection Time: 12/30/13  8:58 PM  Result Value Ref Range   Glucose-Capillary 63 (L) 70 - 99 mg/dL  Glucose, capillary     Status: Abnormal   Collection Time: 12/30/13  9:52 PM  Result Value Ref Range   Glucose-Capillary 63 (L) 70 - 99 mg/dL  Glucose, capillary     Status: None   Collection Time: 12/30/13 10:26 PM  Result Value Ref Range   Glucose-Capillary 82 70 - 99 mg/dL  Protime-INR     Status: Abnormal   Collection Time: 12/31/13  5:00 AM  Result Value Ref Range   Prothrombin Time 30.6 (H) 11.6 - 15.2 seconds   INR 2.90 (H) 0.00 - 1.61  Basic metabolic panel     Status: Abnormal   Collection Time: 12/31/13  5:00 AM  Result Value Ref Range   Sodium 135 135 - 145 mmol/L    Comment: Please note change in reference range.   Potassium 4.1 3.5 - 5.1 mmol/L    Comment: Please note change in reference range.   Chloride 109 96 - 112 mEq/L   CO2 21 19 - 32 mmol/L   Glucose, Bld 86 70 - 99 mg/dL   BUN 21 6 - 23 mg/dL   Creatinine, Ser 2.30 (H) 0.50 - 1.35 mg/dL   Calcium 7.8 (L) 8.4 - 10.5 mg/dL   GFR calc non Af Amer 35 (L) >90 mL/min   GFR calc Af Amer 40 (L) >90 mL/min    Comment: (NOTE) The eGFR has  been calculated using the CKD EPI equation. This calculation has not been validated in all clinical situations. eGFR's persistently <90 mL/min signify possible Chronic Kidney Disease.    Anion gap 5 5 - 15  Glucose, capillary     Status: None   Collection Time: 12/31/13  6:51 AM  Result Value Ref Range   Glucose-Capillary 70 70 - 99 mg/dL  Glucose, capillary     Status: Abnormal   Collection Time: 12/31/13 12:08 PM  Result Value Ref Range   Glucose-Capillary 113 (H) 70 - 99 mg/dL   Comment 1 Notify RN   Calcium, ionized     Status: None   Collection Time: 12/31/13 12:45 PM  Result Value Ref Range   Calcium, Ion 1.21 1.12 - 1.23 mmol/L    Comment: Performed at [facility]  Vancomycin, trough     Status: Abnormal   Collection Time: 12/31/13  3:25 PM  Result Value Ref Range   Vancomycin Tr 21.3 (H) 10.0 - 20.0 ug/mL  Glucose, capillary     Status: None   Collection Time: 12/31/13  4:35 PM  Result  Value Ref Range   Glucose-Capillary 81 70 - 99 mg/dL  Glucose, capillary     Status: None   Collection Time: 12/31/13  9:08 PM  Result Value Ref Range   Glucose-Capillary 80 70 - 99 mg/dL  Protime-INR     Status: Abnormal   Collection Time: 01/01/14  5:35 AM  Result Value Ref Range   Prothrombin Time 33.5 (H) 11.6 - 15.2 seconds   INR 3.26 (H) 0.00 - 1.49  Glucose, capillary     Status: None   Collection Time: 01/01/14  6:45 AM  Result Value Ref Range   Glucose-Capillary 70 70 - 99 mg/dL  Glucose, capillary     Status: Abnormal   Collection Time: 01/01/14 11:50 AM  Result Value Ref Range   Glucose-Capillary 122 (H) 70 - 99 mg/dL  Glucose, capillary     Status: None   Collection Time: 01/01/14  4:33 PM  Result Value Ref Range   Glucose-Capillary 83 70 - 99 mg/dL  Glucose, capillary     Status: None   Collection Time: 01/01/14  9:38 PM  Result Value Ref Range   Glucose-Capillary 92 70 - 99 mg/dL   Comment 1 Notify RN   Protime-INR     Status: Abnormal   Collection  Time: 01/02/14  5:32 AM  Result Value Ref Range   Prothrombin Time 34.6 (H) 11.6 - 15.2 seconds   INR 3.40 (H) 0.00 - 1.49  Glucose, capillary     Status: None   Collection Time: 01/02/14  7:05 AM  Result Value Ref Range   Glucose-Capillary 97 70 - 99 mg/dL   Comment 1 Notify RN      HEENT: normal Cardio: RRR and no murmurs Resp: CTA B/L and unlabored GI: BS positive and nontender nondistended Extremity:  Edema left stump Skin:   Other Ace wrap on the Right stump, Neuro: Alert/Oriented and Abnormal Motor decreased left knee flexion extension inhibited by pain Musc/Skel:  Other Right BKA Gen NAD   Assessment/Plan: 1. Functional deficits secondary to Right BKA which require 3+ hours per day of interdisciplinary therapy in a comprehensive inpatient rehab setting. Physiatrist is providing close team supervision and 24 hour management of active medical problems listed below. Physiatrist and rehab team continue to assess barriers to discharge/monitor patient progress toward functional and medical goals. FIM: FIM - Bathing Bathing Steps Patient Completed: Chest, Right Arm, Left Arm, Front perineal area Bathing: 3: Mod-Patient completes 5-7 [id number] parts or 50-74%  FIM - Upper Body Dressing/Undressing Upper body dressing/undressing steps patient completed: Thread/unthread left sleeve of pullover shirt/dress, Put head through opening of pull over shirt/dress, Thread/unthread right sleeve of pullover shirt/dresss, Pull shirt over trunk Upper body dressing/undressing: 5: Set-up assist to: Obtain clothing/put away FIM - Lower Body Dressing/Undressing Lower body dressing/undressing steps patient completed: Thread/unthread right underwear leg, Thread/unthread left underwear leg, Thread/unthread right pants leg, Thread/unthread left pants leg Lower body dressing/undressing: 1: Two helpers  FIM - Toileting Toileting steps completed by patient: Performs perineal hygiene Toileting: 2: Max-Patient  completed 1 of 3 steps  FIM - TRadio producerDevices: BEngineer, civil (consulting) WInsurance account managerTransfers: 4-To toilet/BSC: Min A (steadying Pt. > 75%), 4-From toilet/BSC: Min A (steadying Pt. > 75%)  FIM - Bed/Chair Transfer Bed/Chair Transfer Assistive Devices: Walker, Arm rests, Orthosis Bed/Chair Transfer: 1: Two helpers, 1: Chair or W/C > Bed: Total A (helper does all/Pt. < 25%), 1: Bed > Chair or W/C: Total A (helper does all/Pt. < 25%), 5:  Supine > Sit: Supervision (verbal cues/safety issues)  FIM - Locomotion: Wheelchair Distance: 150 Locomotion: Wheelchair: 5: Travels 150 ft or more: maneuvers on rugs and over door sills with supervision, cueing or coaxing FIM - Locomotion: Ambulation Locomotion: Ambulation Assistive Devices: Administrator Ambulation/Gait Assistance: 1: +2 Total assist Locomotion: Ambulation: 0: Activity did not occur  Comprehension Comprehension Mode: Auditory Comprehension: 5-Follows basic conversation/direction: With extra time/assistive device  Expression Expression Mode: Verbal Expression: 5-Expresses basic needs/ideas: With extra time/assistive device  Social Interaction Social Interaction: 4-Interacts appropriately 75 - 89% of the time - Needs redirection for appropriate language or to initiate interaction.  Problem Solving Problem Solving: 5-Solves basic problems: With no assist  Memory Memory: 7-Complete Independence: No helper  Medical Problem List and Plan: 1. Functional deficits secondary to R-BKA, also severely deconditioned 2. DVT Prophylaxis/Anticoagulation: Pharmaceutical: Lovenox 3. Pain Management: Premedicate prior to therapy sessions. Continues to require IV dilaudid in addition to hydrocodone --will escalate to oxycodone prn and d/c dilaudid.  4. Mood: team to provide ego support. Will add lexapro for mood stabilization. LCSW to follow for evaluation and support. Will need psych follow up also.  5.  Neuropsych: This patient is capable of making decisions on his own behalf. 6. Skin/Wound Care: Daily dressing changes to left buttock wound. Routine pressure relief measures. Monitor wound daily for healing. Maintain adequate nutrition. .  7. Fluids/Electrolytes/Nutrition: Monitor I/O. Add protein supplements to help with low protein stores.  8. Reactive leucocytosis: Resolving. Recheck in am. Monitor wound and temperature curve daily.  9. DM type 2: Po intake remains poor. On Lantus to prevent hypoglycemic episodes. Will need to be changed to 70/30 prior to d/c home due to financial constraints. Will monitor BS with ac/hs checks and titrate insulin as indicated. Reinforce importance of medication compliance.  10. CKD: Monitor renal status closely as on IV Vancomycin. Avoid other nephrotoxic drugs. Will recheck lytes in am.  11. HTN: Monitor BP tid on norvasc. Off prinizide and lasix due to AKI on CKD. Baseline Cr- 1.78 but is stabilizing to 2.3 range.  12. Acute on chronic anemia: Low iron stores with high ferritin levels. Likely poor absorption. Transfused with 2 units on 12/17. Recheck labs on Monday.  13. Left buttock wound: S/p I & D 12/14--antibiotic Day # 11/14  14. Resting tachycardia: HR in 110 range. Multifactorial due to obesity  anemia as well as deconditioning. Will monitor for symptoms with activity. May need low dose BB.  15.  Atelectasis encurage IS  LOS (Days) 7 A FACE TO FACE EVALUATION WAS PERFORMED  KIRSTEINS,ANDREW E 01/02/2014, 11:17 AM

## 2014-01-03 ENCOUNTER — Inpatient Hospital Stay (HOSPITAL_COMMUNITY): Payer: Medicaid Other | Admitting: Occupational Therapy

## 2014-01-03 ENCOUNTER — Inpatient Hospital Stay (HOSPITAL_COMMUNITY): Payer: Medicaid Other | Admitting: Physical Therapy

## 2014-01-03 DIAGNOSIS — E1059 Type 1 diabetes mellitus with other circulatory complications: Secondary | ICD-10-CM

## 2014-01-03 DIAGNOSIS — I10 Essential (primary) hypertension: Secondary | ICD-10-CM

## 2014-01-03 LAB — GLUCOSE, CAPILLARY
GLUCOSE-CAPILLARY: 130 mg/dL — AB (ref 70–99)
GLUCOSE-CAPILLARY: 123 mg/dL — AB (ref 70–99)
Glucose-Capillary: 132 mg/dL — ABNORMAL HIGH (ref 70–99)
Glucose-Capillary: 156 mg/dL — ABNORMAL HIGH (ref 70–99)
Glucose-Capillary: 140 mg/dL — ABNORMAL HIGH (ref 70–99)

## 2014-01-03 LAB — PROTIME-INR
INR: 2.92 — AB (ref 0.00–1.49)
PROTHROMBIN TIME: 30.8 s — AB (ref 11.6–15.2)

## 2014-01-03 MED ORDER — WARFARIN 0.5 MG HALF TABLET
0.5000 mg | ORAL_TABLET | Freq: Once | ORAL | Status: AC
  Administered 2014-01-03: 0.5 mg via ORAL
  Filled 2014-01-03: qty 1

## 2014-01-03 NOTE — Progress Notes (Signed)
Patient ID: [name], male   DOB: [birth year], 38 y.o.   MRN: [id number]  01/03/14.  38 year old patient who is admitted for CIR with  functional deficits secondary to R-BKA, also severely deconditioned  Subjective/Complaints: On complaint today is some constipation issues.  Did require an enema yesterday on 12/25    Past Medical History  Diagnosis Date  . DVT (deep venous thrombosis) 09/2002    patient reports additional DVTs in '06 & '11 (unconfirmed)  . Pulmonary embolism 09/2002    treated with 6 months of warfarin  . Hypertension   . Chest pain, neg MI, normal coronaries by cath 02/18/2013  . Hyperlipidemia 02/19/2013  . Acute venous embolism and thrombosis of deep vessels of proximal lower extremity 07/19/2011  . Type I diabetes mellitus dx'd 2001  . GERD (gastroesophageal reflux disease)   . Diabetic ulcer of right foot   . CKD (chronic kidney disease) stage 3, GFR 30-59 ml/min 02/19/2013  . Nephrotic syndrome     History   Social History  . Marital Status: Single    Spouse Name: N/A    Number of Children: N/A  . Years of Education: 15.5   Occupational History  . [employer]   . student   .     Social History Main Topics  . Smoking status: Never Smoker   . Smokeless tobacco: Never Used  . Alcohol Use: Yes     Comment: 12/09/2013 "a few beers q 6 months or so"  . Drug Use: No  . Sexual Activity: No   Other Topics Concern  . Not on file   Social History Narrative   Financial assistance approved for 100% discount at [facility] and has [facility] card per [employer]   09/01/2009.      Lives in [place] with mother and sister.                Past Surgical History  Procedure Laterality Date  . Incision and drainage abscess  2007; 2015    "back"  . Cardiac catheterization  02/18/2013    normal coronaries  . I&d extremity Right 12/14/2013    Procedure: IRRIGATION AND DEBRIDEMENT RIGHT FOOT;  Surgeon: [name], MD;  Location: [place];  Service: Orthopedics;   Laterality: Right;  . Left heart catheterization with coronary angiogram N/A 02/18/2013    Procedure: LEFT HEART CATHETERIZATION WITH CORONARY ANGIOGRAM;  Surgeon: Tiffinie Caillier M Martinique, MD;  Location: [facility] CATH LAB;  Service: Cardiovascular;  Laterality: N/A;  . Amputation Right 12/22/2013    Procedure: AMPUTATION BELOW KNEE;  Surgeon: [name], MD;  Location: [place];  Service: Orthopedics;  Laterality: Right;  . Incision and drainage of wound Right 12/22/2013    Procedure: I&D RIGHT BUTTOCK;  Surgeon: [name], MD;  Location: [place];  Service: Orthopedics;  Laterality: Right;    Family History  Problem Relation Age of Onset  . Heart disease    . Diabetes    . Obesity      No Known Allergies  No current facility-administered medications on file prior to encounter.   Current Outpatient Prescriptions on File Prior to Encounter  Medication Sig Dispense Refill  . furosemide (LASIX) 40 MG tablet Take 1 tablet (40 mg total) by mouth daily. 30 tablet   . insulin NPH-regular Human (NOVOLIN 70/30) (70-30) 100 UNIT/ML injection Inject 35-50 Units into the skin 2 (two) times daily with a meal. 50 units in the morning and 45 units in the evening (Patient taking differently: Inject  45-50 Units into the skin 2 (two) times daily with a meal. 50 units in the morning and 45 units in the evening) 10 mL 11  . lisinopril-hydrochlorothiazide (PRINZIDE,ZESTORETIC) 20-25 MG per tablet Take 1 tablet by mouth daily. 30 tablet 6  . lovastatin (MEVACOR) 20 MG tablet Take 1 tablet (20 mg total) by mouth daily. 30 tablet 11  . metFORMIN (GLUCOPHAGE) 500 MG tablet Take 2 tablets (1,000 mg total) by mouth 2 (two) times daily with a meal. 120 tablet 6  . ranitidine (ZANTAC) 300 MG tablet Take 1 tablet (300 mg total) by mouth at bedtime. 30 tablet 3  . warfarin (COUMADIN) 5 MG tablet Take 1.5 tablets (7.5 mg) all days of week EXCEPT on Mondays and Thursdays take 2 tablets. 50 tablet 1    BP 163/91 mmHg  Pulse 100   Temp(Src) 98.5 F (36.9 C) (Oral)  Resp 20  Wt 169.282 kg (373 lb 3.2 oz)  SpO2 95%    Objective: Vital Signs: Blood pressure 163/91, pulse 100, temperature 98.5 F (36.9 C), temperature source Oral, resp. rate 20, weight 169.282 kg (373 lb 3.2 oz), SpO2 95 %. No results found. Results for orders placed or performed during the hospital encounter of 12/26/13 (from the past 72 hour(s))  Glucose, capillary     Status: Abnormal   Collection Time: 12/31/13 12:08 PM  Result Value Ref Range   Glucose-Capillary 113 (H) 70 - 99 mg/dL   Comment 1 Notify RN   Calcium, ionized     Status: None   Collection Time: 12/31/13 12:45 PM  Result Value Ref Range   Calcium, Ion 1.21 1.12 - 1.23 mmol/L    Comment: Performed at [facility]  Vancomycin, trough     Status: Abnormal   Collection Time: 12/31/13  3:25 PM  Result Value Ref Range   Vancomycin Tr 21.3 (H) 10.0 - 20.0 ug/mL  Glucose, capillary     Status: None   Collection Time: 12/31/13  4:35 PM  Result Value Ref Range   Glucose-Capillary 81 70 - 99 mg/dL  Glucose, capillary     Status: None   Collection Time: 12/31/13  9:08 PM  Result Value Ref Range   Glucose-Capillary 80 70 - 99 mg/dL  Protime-INR     Status: Abnormal   Collection Time: 01/01/14  5:35 AM  Result Value Ref Range   Prothrombin Time 33.5 (H) 11.6 - 15.2 seconds   INR 3.26 (H) 0.00 - 1.49  Glucose, capillary     Status: None   Collection Time: 01/01/14  6:45 AM  Result Value Ref Range   Glucose-Capillary 70 70 - 99 mg/dL  Glucose, capillary     Status: Abnormal   Collection Time: 01/01/14 11:50 AM  Result Value Ref Range   Glucose-Capillary 122 (H) 70 - 99 mg/dL  Glucose, capillary     Status: None   Collection Time: 01/01/14  4:33 PM  Result Value Ref Range   Glucose-Capillary 83 70 - 99 mg/dL  Glucose, capillary     Status: None   Collection Time: 01/01/14  9:38 PM  Result Value Ref Range   Glucose-Capillary 92 70 - 99 mg/dL   Comment 1 Notify RN    Protime-INR     Status: Abnormal   Collection Time: 01/02/14  5:32 AM  Result Value Ref Range   Prothrombin Time 34.6 (H) 11.6 - 15.2 seconds   INR 3.40 (H) 0.00 - 1.49  Glucose, capillary     Status: None  Collection Time: 01/02/14  7:05 AM  Result Value Ref Range   Glucose-Capillary 97 70 - 99 mg/dL   Comment 1 Notify RN   Glucose, capillary     Status: None   Collection Time: 01/02/14 11:28 AM  Result Value Ref Range   Glucose-Capillary 96 70 - 99 mg/dL  Glucose, capillary     Status: Abnormal   Collection Time: 01/02/14 10:19 PM  Result Value Ref Range   Glucose-Capillary 139 (H) 70 - 99 mg/dL  Protime-INR     Status: Abnormal   Collection Time: 01/03/14  5:00 AM  Result Value Ref Range   Prothrombin Time 30.8 (H) 11.6 - 15.2 seconds   INR 2.92 (H) 0.00 - 1.49  Glucose, capillary     Status: Abnormal   Collection Time: 01/03/14  6:40 AM  Result Value Ref Range   Glucose-Capillary 132 (H) 70 - 99 mg/dL      Patient Vitals for the past 24 hrs:  BP Temp Temp src Pulse Resp SpO2  01/03/14 0600 (!) 163/91 mmHg 98.5 F (36.9 C) Oral 100 20 95 %  01/02/14 1632 (!) 151/76 mmHg 99.1 F (37.3 C) Oral 99 20 99 %     Intake/Output Summary (Last 24 hours) at 01/03/14 0809 Last data filed at 01/03/14 0330  Gross per 24 hour  Intake    360 ml  Output   2150 ml  Net  -1790 ml    CBG (last 3)   Recent Labs  01/02/14 1128 01/02/14 2219 01/03/14 0640  GLUCAP 96 139* 132*     HEENT: normal Cardio: RRR and no murmurs Resp: CTA B/L and unlabored GI: BS positive and nontender nondistended Extremity:  No Edema  Skin:   Other Ace wrap on the Right stump, Neuro: Alert/Oriented and Abnormal Motor decreased left knee flexion extension inhibited by pain Musc/Skel:  Other Right BKA Gen NAD  Obese   Assessment/Plan: 1. Functional deficits secondary to Right BKA2. DVT Prophylaxis/Anticoagulation: Pharmaceutical: Lovenox 3. Pain Management: Premedicate prior to therapy  sessions. Continues to require IV dilaudid in addition to hydrocodone --will escalate to oxycodone prn and d/c dilaudid.  4. Mood: team to provide ego support. Will add lexapro for mood stabilization. LCSW to follow for evaluation and support. Will need psych follow up also.  5. Neuropsych: This patient is capable of making decisions on his own behalf. 6. Skin/Wound Care: Daily dressing changes to left buttock wound. Routine pressure relief measures. Monitor wound daily for healing. Maintain adequate nutrition. .  7. Fluids/Electrolytes/Nutrition: Monitor I/O. Add protein supplements to help with low protein stores.  8. Reactive leucocytosis: Resolving. Recheck in am. Monitor wound and temperature curve daily.  9. DM type 2: Po intake remains poor. On Lantus to prevent hypoglycemic episodes. Will need to be changed to 70/30 prior to d/c home due to financial constraints. Will monitor BS with ac/hs checks and titrate insulin as indicated. Reinforce importance of medication compliance.  10. CKD: Monitor renal status closely as on IV Vancomycin. Avoid other nephrotoxic drugs. Will recheck lytes in am.  11. HTN: Monitor BP tid on norvasc. Off prinizide and lasix due to AKI on CKD. Baseline Cr- 1.78 but is stabilizing to 2.3 range.  12. Acute on chronic anemia: Low iron stores with high ferritin levels. Likely poor absorption. Transfused with 2 units on 12/17. Recheck labs on Monday.    LOS (Days) 8 A FACE TO FACE EVALUATION WAS PERFORMED  Nyoka Cowden 01/03/2014, 8:07 AM

## 2014-01-03 NOTE — Progress Notes (Addendum)
Physical Therapy Weekly Progress Note  Patient Details  Name: [name] MRN: [id number] Date of Birth: 11/12/1975  Beginning of progress report period: December 27, 2013 End of progress report period: January 03, 2014  Today's Date: 01/03/2014 PT Individual Time: 0901-1005  PT Concurrent Time: 1430-1511 PT Individual Time Calculation (min): 64 min  And 41 minutes; missed first 19 minutes due to extreme lethargy after being given anti-nausea medication  Patient has made slow progress and has met 0 of 8 long term goals.  Gait and stair goals D/C due to lack of progress and pt continuing to require +2 A to hop with RW.  Transfer goals also downgraded to supervision due to pt continuing to require verbal cues to safely set up and perform functional transfers.  Recommending pt D/C at w/c level with supervision for transfers.  Pt continues to require extra time to complete all functional mobility as well as max encouragement to initiate mobility.  Pt states he is still dealing with the "side effects of pain medication" but is motivated to participate and improve.      Patient continues to demonstrate the following deficits: UE and LE weakness, impaired balance, impaired endurance, impaired endurance and delayed initiation and sequencing and therefore will continue to benefit from skilled PT intervention to enhance overall performance with activity tolerance, balance and functional use of  right upper extremity, left upper extremity and left lower extremity for functional mobility.  Patient not progressing toward long term goals.  See goal revision..  Plan of care revisions: transfers and community w/c mobility downgraded to supervision; gait and stair goals D/C.  PT Short Term Goals Week 1:  PT Short Term Goal 1 (Week 1): STGs = LTGs due to ELOS PT Short Term Goal 1 - Progress (Week 1): Revised due to lack of progress (gait and stair goals discharged; unsafe at this time due to lack of  progress) Week 2:  PT Short Term Goal 1 (Week 2): = LTG which have been downgraded due to lack of progress  Skilled Therapeutic Interventions/Progress Updates:   Pt received in bed in prone performing R hamstring curls.  Pt continued to report lethargy and delayed initiation due to pain medication but willing to participate.  Pt transitioned to EOB with supervision (assistance for IV pole) and performed transfer bed > w/c scooting laterally with +2 for safety with one person stabilizing w/c and other therapist protecting RLE from hitting ground and providing verbal cues for safety and sequencing.  Pt continued to slide buttocks across the surface causing him increase shear and friction on buttocks wounds, also causing him to hit the brake and release brake on drive wheel placing pt a increased falls risk if second person was not present.  Due to pt's inability to clear his buttocks pt will unable to reach mod I with this transfer method.  Educated pt on and demonstrated use of slideboard for more independent and safe lateral transfers bed <> w/c.  Pt gave repeat demonstration of slideboard mat <> w/c with min A and max-total verbal cues for w/c set up and appropriate board placement.  With slideboard, Pt was able to transfer with decreased assistance for safety but does continue to cause shear on his buttocks.  Will continue to work towards squat or stand pivot to decrease risk for skin shearing and friction.  Returned to room and pt set up with all items within reach.  Apple board also added to pt cushion for more stable seating base.  PM session: upon entering pt's room pt asleep in w/c; pt reported to be very nauseous during OT session and had received anti-nausea medication but did not eat lunch.  Pt also slurring his sentence; RN notified of extreme lethargy and slurring; CBG checked by RN and [facility] per RN.  Pt stating he feels like "crap" and does not feel he will be able to participate in concurrent  session.    Halfway through concurrent session pt's family arrived and pt became more alert and willing to participate in concurrent session with family present.  Pt did participate in UE strengthening with 2 sets x 10 reps w/c push ups for pressure relief and tricep strengthening; w/c propulsion up/down ramp with mod A and w/c propulsion x 100' with added resistance to back of w/c; pt also dynamic sitting balance while performing bounce passes to another patient while sitting upright in w/c away from back to facilitate increased core activation as well as  performing green theraband resisted exercises: one set x 10 reps tricep kick backs and scap retraction rows with trunk away from back of w/c.  Pt tolerated well but continued to appear very lethargic with all initiation delayed.   Therapy Documentation Precautions:  Precautions Precautions: Fall Precaution Comments: New Rt BKA, with protector Required Braces or Orthoses: Knee Immobilizer - Right (limb guard) Restrictions Weight Bearing Restrictions: Yes RLE Weight Bearing: Non weight bearing Other Position/Activity Restrictions: new right BKA Pain: Pain Assessment Pain Assessment: 0-10 Pain Score: 0-No pain Faces Pain Scale: No hurt Pain Type: Surgical pain Pain Location: Knee Pain Orientation: Right Pain Descriptors / Indicators: Aching Pain Intervention(s): Medication (See eMAR)  See FIM for current functional status  Therapy/Group: Individual Therapy and Concurrent Therapy  Raylene Everts [facility] 01/03/2014, 12:26 PM

## 2014-01-03 NOTE — Plan of Care (Signed)
Problem: RH Balance Goal: LTG Patient will maintain dynamic standing balance (PT) LTG: Patient will maintain dynamic standing balance with assistance during mobility activities (PT)  Downgraded due to slow progress  Problem: RH Bed to Chair Transfers Goal: LTG Patient will perform bed/chair transfers w/assist (PT) LTG: Patient will perform bed/chair transfers with assistance, with/without cues (PT).  Downgraded due to lack of progress and pt continued need for cues for safe transfers  Problem: RH Car Transfers Goal: LTG Patient will perform car transfers with assist (PT) LTG: Patient will perform car transfers with assistance (PT).  Downgraded due to slow progress towards goals  Problem: RH Ambulation Goal: LTG Patient will ambulate in controlled environment (PT) LTG: Patient will ambulate in a controlled environment, # of feet with assistance (PT).  Outcome: Not Applicable Date Met:  53/97/67 Discharged gait goals due to lack of progress; unsafe at this time. Goal: LTG Patient will ambulate in home environment (PT) LTG: Patient will ambulate in home environment, # of feet with assistance (PT).  Outcome: Not Applicable Date Met:  34/19/37 D/C gait goals; unsafe at this time due to lack of progress  Problem: RH Wheelchair Mobility Goal: LTG Patient will propel w/c in community environment (PT) LTG: Patient will propel wheelchair in community environment, # of feet with assist (PT)  Downgraded due to pt requiring physical assistance to safely negotiate uneven surfaces     Problem: RH Stairs Goal: LTG Patient will ambulate up and down stairs w/assist (PT) LTG: Patient will ambulate up and down # of stairs with assistance (PT)  Outcome: Not Applicable Date Met:  90/24/09 D/C stair goal due to lack of progress; unsafe at this time

## 2014-01-03 NOTE — Therapy (Signed)
Occupational therapy note:   1316  Patient approached for OT therapy and complaining of nausea and not feeling well after just having completed a vomited episode.    This clinician touched base with patient's nurse who has already planned to give anti nausea meds and okayed this clinician to give him diet gingerale additionally to try to help settle his stomach.     Patient indicated he was able to do any activity or having any type communicatio currently and requested a few minutes to see if the nausea and ill feelings will subside.   This clinican will offer therapy again a little later if patient is well enough.

## 2014-01-03 NOTE — Progress Notes (Signed)
Occupational Therapy Session Note  Patient Details  Name: [name] MRN: [id number] Date of Birth: 1975/10/05  Today's Date: 01/03/2014 OT Individual Time: 1300-1320 OT Individual Time Calculation (min): 20 min    Skilled Therapeutic Interventions/Progress Updates: Patient scheduled for 60 minute OT session; however, upon approach for therapy patient complained that he did not feel well, was nauseated and just threw up.    This clinician gave sugar free Diet Gingerale to ease nausea after checking in with and okaying with patient's nurse.   RN gave anti nausea medications.   Patient was not able to participate intitially in session, but when clinician returned 25 min later, patient stated he felt less nauseated but was very groggy and stated, "I cannot focus."   Patient was able to attend for about 20 minutes in endurance and UE exercises before dozing off to sleep again.    Therapy Documentation Precautions:  Precautions Precautions: Fall Precaution Comments: New Rt BKA, with protector Required Braces or Orthoses: Knee Immobilizer - Right (limb guard) Restrictions Weight Bearing Restrictions: Yes RLE Weight Bearing: Non weight bearing Other Position/Activity Restrictions: new right BKA General: General OT Amount of Missed Time: 40 Minutes (40 min)  Pain:denied during OT session  See FIM for current functional status  Therapy/Group: Individual Therapy  Alfredia Ferguson [facility] 01/03/2014, 3:02 PM

## 2014-01-03 NOTE — Progress Notes (Signed)
ANTICOAGULATION CONSULT NOTE - Follow Up Consult  Pharmacy Consult for Coumadin Indication: Hx PE/DVT  No Known Allergies  Patient Measurements: Weight: (!) 373 lb 3.2 oz (169.282 kg) Heparin Dosing Weight:   Vital Signs: Temp: 98.6 F (37 C) (12/26 1427) Temp Source: Oral (12/26 1427) BP: 150/82 mmHg (12/26 1427) Pulse Rate: 91 (12/26 1427)  Labs:  Recent Labs  01/01/14 0535 01/02/14 0532 01/03/14 0500  LABPROT 33.5* 34.6* 30.8*  INR 3.26* 3.40* 2.92*    Estimated Creatinine Clearance: 72.8 mL/min (by C-G formula based on Cr of 2.3).   Medications:  Scheduled:  . amLODipine  10 mg Oral Daily  . calcium carbonate  1 tablet Oral TID WC  . carvedilol  6.25 mg Oral BID WC  . escitalopram  20 mg Oral QHS  . feeding supplement (GLUCERNA SHAKE)  237 mL Oral Q1500  . feeding supplement (PRO-STAT SUGAR FREE 64)  30 mL Oral TID WC  . furosemide  20 mg Oral Daily  . insulin aspart  0-15 Units Subcutaneous TID WC  . insulin glargine  10 Units Subcutaneous QHS  . pantoprazole  40 mg Oral Daily  . pravastatin  20 mg Oral q1800  . senna-docusate  2 tablet Oral BID  . sorbitol  45 mL Oral Once  . tamsulosin  0.4 mg Oral QPC supper  . vancomycin  1,000 mg Intravenous Q24H  . Warfarin - Pharmacist Dosing Inpatient   Does not apply q1800    Assessment: 38yo male with hx DVT and PE.  No Coumadin given yesterday and INR within goal range today.  No bleeding noted, no CBC.  Goal of Therapy:  INR 2-3 Monitor platelets by anticoagulation protocol: Yes   Plan:  Coumadin 0.5mg  Daily INR  Gracy Bruins, PharmD Clinical Pharmacist [facility]

## 2014-01-04 LAB — GLUCOSE, CAPILLARY
GLUCOSE-CAPILLARY: 142 mg/dL — AB (ref 70–99)
Glucose-Capillary: 106 mg/dL — ABNORMAL HIGH (ref 70–99)
Glucose-Capillary: 108 mg/dL — ABNORMAL HIGH (ref 70–99)
Glucose-Capillary: 130 mg/dL — ABNORMAL HIGH (ref 70–99)

## 2014-01-04 LAB — PROTIME-INR
INR: 3 — ABNORMAL HIGH (ref 0.00–1.49)
PROTHROMBIN TIME: 31.4 s — AB (ref 11.6–15.2)

## 2014-01-04 NOTE — H&P (Signed)
Patient reports abdominal discomfort today and presents with persistent nausea/vomithing this AM. Daily AM oral medications held per patient r/t Nausea/Vomiting.  IV antiemetic given (see MAR) with patient reporting some refief.  LBM reported 12/23; however patient refusing laxative; all four quads active upon assessment and patient passing flatus.  Patient presents with low grade fever; unable to take po Tyleonol at this time.  RN will continue to monitor through shift for changes.

## 2014-01-04 NOTE — Progress Notes (Signed)
ANTICOAGULATION and ANTIBIOTIC CONSULT NOTE - Follow Up Consult  Pharmacy Consult for Coumadin, Vancomycin Indication: Hx PE, DVT & gluteal abscess  No Known Allergies  Patient Measurements: Weight: (!) 373 lb 3.2 oz (169.282 kg) Heparin Dosing Weight:   Vital Signs: Temp: 99.1 F (37.3 C) (12/27 1302) Temp Source: Oral (12/27 1302) BP: 145/70 mmHg (12/27 0520) Pulse Rate: 98 (12/27 0520)  Labs:  Recent Labs  01/02/14 0532 01/03/14 0500 01/04/14 0455  LABPROT 34.6* 30.8* 31.4*  INR 3.40* 2.92* 3.00*    Estimated Creatinine Clearance: 72.8 mL/min (by C-G formula based on Cr of 2.3).   Medications:  Scheduled:  . amLODipine  10 mg Oral Daily  . calcium carbonate  1 tablet Oral TID WC  . carvedilol  6.25 mg Oral BID WC  . escitalopram  20 mg Oral QHS  . feeding supplement (GLUCERNA SHAKE)  237 mL Oral Q1500  . feeding supplement (PRO-STAT SUGAR FREE 64)  30 mL Oral TID WC  . furosemide  20 mg Oral Daily  . insulin aspart  0-15 Units Subcutaneous TID WC  . insulin glargine  10 Units Subcutaneous QHS  . pantoprazole  40 mg Oral Daily  . pravastatin  20 mg Oral q1800  . senna-docusate  2 tablet Oral BID  . sorbitol  45 mL Oral Once  . tamsulosin  0.4 mg Oral QPC supper  . vancomycin  1,000 mg Intravenous Q24H  . Warfarin - Pharmacist Dosing Inpatient   Does not apply q1800    Assessment: 38yo male with Hx DVT, PE.  INR has been elevated, but dropped < 3 & given small dose on 12/26.  Today INR up to 3 again.  No bleeding noted.  Last CBC 12/21.  Pt is completing 14days of Vancomycin for buttock abscess which was (+)MRSA.  Her last dose is this PM and stop date is in place.  She has been afebrile.  Goal of Therapy:  INR 2-3 Monitor platelets by anticoagulation protocol: Yes   Plan:  No Coumadin today F/U INR in AM  Gracy Bruins, PharmD Clinical Pharmacist [facility]

## 2014-01-04 NOTE — Progress Notes (Signed)
Patient ID: [name], male   DOB: September 24, 1975, 38 y.o.   MRN: [id number]  Patient ID: [name], male   DOB: 1975/07/29, 38 y.o.   MRN: [id number]  01/04/14.  38 year old patient who is admitted for CIR with  functional deficits secondary to R-BKA, also severely deconditioned  Subjective/Complaints: On complaint today is some persistent constipation issues and mild nausea.  Remains on vancomycin   Past Medical History  Diagnosis Date  . DVT (deep venous thrombosis) 09/2002    patient reports additional DVTs in '06 & '11 (unconfirmed)  . Pulmonary embolism 09/2002    treated with 6 months of warfarin  . Hypertension   . Chest pain, neg MI, normal coronaries by cath 02/18/2013  . Hyperlipidemia 02/19/2013  . Acute venous embolism and thrombosis of deep vessels of proximal lower extremity 07/19/2011  . Type I diabetes mellitus dx'd 2001  . GERD (gastroesophageal reflux disease)   . Diabetic ulcer of right foot   . CKD (chronic kidney disease) stage 3, GFR 30-59 ml/min 02/19/2013  . Nephrotic syndrome     History   Social History  . Marital Status: Single    Spouse Name: N/A    Number of Children: N/A  . Years of Education: 15.5   Occupational History  . [employer]   . student   .     Social History Main Topics  . Smoking status: Never Smoker   . Smokeless tobacco: Never Used  . Alcohol Use: Yes     Comment: 12/09/2013 "a few beers q 6 months or so"  . Drug Use: No  . Sexual Activity: No   Other Topics Concern  . Not on file   Social History Narrative   Financial assistance approved for 100% discount at [facility] and has [facility] card per [employer]   09/01/2009.      Lives in [place] with mother and sister.                Past Surgical History  Procedure Laterality Date  . Incision and drainage abscess  2007; 2015    "back"  . Cardiac catheterization  02/18/2013    normal coronaries  . I&d extremity Right 12/14/2013    Procedure: IRRIGATION AND DEBRIDEMENT  RIGHT FOOT;  Surgeon: [name], MD;  Location: [facility];  Service: Orthopedics;  Laterality: Right;  . Left heart catheterization with coronary angiogram N/A 02/18/2013    Procedure: LEFT HEART CATHETERIZATION WITH CORONARY ANGIOGRAM;  Surgeon: [name], MD;  Location: [facility] CATH LAB;  Service: Cardiovascular;  Laterality: N/A;  . Amputation Right 12/22/2013    Procedure: AMPUTATION BELOW KNEE;  Surgeon: [name], MD;  Location: [facility];  Service: Orthopedics;  Laterality: Right;  . Incision and drainage of wound Right 12/22/2013    Procedure: I&D RIGHT BUTTOCK;  Surgeon: [name], MD;  Location: [facility];  Service: Orthopedics;  Laterality: Right;    Family History  Problem Relation Age of Onset  . Heart disease    . Diabetes    . Obesity      No Known Allergies  No current facility-administered medications on file prior to encounter.   Current Outpatient Prescriptions on File Prior to Encounter  Medication Sig Dispense Refill  . furosemide (LASIX) 40 MG tablet Take 1 tablet (40 mg total) by mouth daily. 30 tablet   . insulin NPH-regular Human (NOVOLIN 70/30) (70-30) 100 UNIT/ML injection Inject 35-50 Units into the skin 2 (two) times daily with a meal.  50 units in the morning and 45 units in the evening (Patient taking differently: Inject 45-50 Units into the skin 2 (two) times daily with a meal. 50 units in the morning and 45 units in the evening) 10 mL 11  . lisinopril-hydrochlorothiazide (PRINZIDE,ZESTORETIC) 20-25 MG per tablet Take 1 tablet by mouth daily. 30 tablet 6  . lovastatin (MEVACOR) 20 MG tablet Take 1 tablet (20 mg total) by mouth daily. 30 tablet 11  . metFORMIN (GLUCOPHAGE) 500 MG tablet Take 2 tablets (1,000 mg total) by mouth 2 (two) times daily with a meal. 120 tablet 6  . ranitidine (ZANTAC) 300 MG tablet Take 1 tablet (300 mg total) by mouth at bedtime. 30 tablet 3  . warfarin (COUMADIN) 5 MG tablet Take 1.5 tablets (7.5 mg) all days of week EXCEPT on  Mondays and Thursdays take 2 tablets. 50 tablet 1    BP 145/70 mmHg  Pulse 98  Temp(Src) 99.1 F (37.3 C) (Oral)  Resp 19  Wt 169.282 kg (373 lb 3.2 oz)  SpO2 93%  Lab Results  Component Value Date   INR 3.00* 01/04/2014   INR 2.92* 01/03/2014   INR 3.40* 01/02/2014    Objective: Vital Signs: Blood pressure 145/70, pulse 98, temperature 99.1 F (37.3 C), temperature source Oral, resp. rate 19, weight 169.282 kg (373 lb 3.2 oz), SpO2 93 %. No results found. Results for orders placed or performed during the hospital encounter of 12/26/13 (from the past 72 hour(s))  Glucose, capillary     Status: Abnormal   Collection Time: 01/01/14 11:50 AM  Result Value Ref Range   Glucose-Capillary 122 (H) 70 - 99 mg/dL  Glucose, capillary     Status: None   Collection Time: 01/01/14  4:33 PM  Result Value Ref Range   Glucose-Capillary 83 70 - 99 mg/dL  Glucose, capillary     Status: None   Collection Time: 01/01/14  9:38 PM  Result Value Ref Range   Glucose-Capillary 92 70 - 99 mg/dL   Comment 1 Notify RN   Protime-INR     Status: Abnormal   Collection Time: 01/02/14  5:32 AM  Result Value Ref Range   Prothrombin Time 34.6 (H) 11.6 - 15.2 seconds   INR 3.40 (H) 0.00 - 1.49  Glucose, capillary     Status: None   Collection Time: 01/02/14  7:05 AM  Result Value Ref Range   Glucose-Capillary 97 70 - 99 mg/dL   Comment 1 Notify RN   Glucose, capillary     Status: None   Collection Time: 01/02/14 11:28 AM  Result Value Ref Range   Glucose-Capillary 96 70 - 99 mg/dL  Glucose, capillary     Status: Abnormal   Collection Time: 01/02/14 10:19 PM  Result Value Ref Range   Glucose-Capillary 139 (H) 70 - 99 mg/dL  Protime-INR     Status: Abnormal   Collection Time: 01/03/14  5:00 AM  Result Value Ref Range   Prothrombin Time 30.8 (H) 11.6 - 15.2 seconds   INR 2.92 (H) 0.00 - 1.49  Glucose, capillary     Status: Abnormal   Collection Time: 01/03/14  6:40 AM  Result Value Ref Range    Glucose-Capillary 132 (H) 70 - 99 mg/dL  Glucose, capillary     Status: Abnormal   Collection Time: 01/03/14 11:37 AM  Result Value Ref Range   Glucose-Capillary 156 (H) 70 - 99 mg/dL   Comment 1 Notify RN   Glucose, capillary  Status: Abnormal   Collection Time: 01/03/14  2:05 PM  Result Value Ref Range   Glucose-Capillary 130 (H) 70 - 99 mg/dL  Glucose, capillary     Status: Abnormal   Collection Time: 01/03/14  4:43 PM  Result Value Ref Range   Glucose-Capillary 123 (H) 70 - 99 mg/dL   Comment 1 Notify RN   Glucose, capillary     Status: Abnormal   Collection Time: 01/03/14  8:59 PM  Result Value Ref Range   Glucose-Capillary 140 (H) 70 - 99 mg/dL  Protime-INR     Status: Abnormal   Collection Time: 01/04/14  4:55 AM  Result Value Ref Range   Prothrombin Time 31.4 (H) 11.6 - 15.2 seconds   INR 3.00 (H) 0.00 - 1.49  Glucose, capillary     Status: Abnormal   Collection Time: 01/04/14  7:12 AM  Result Value Ref Range   Glucose-Capillary 106 (H) 70 - 99 mg/dL      Patient Vitals for the past 24 hrs:  BP Temp Temp src Pulse Resp SpO2  01/04/14 0520 (!) 145/70 mmHg 99.1 F (37.3 C) Oral 98 19 93 %  01/03/14 1427 (!) 150/82 mmHg 98.6 F (37 C) Oral 91 18 98 %     Intake/Output Summary (Last 24 hours) at 01/04/14 0801 Last data filed at 01/04/14 0209  Gross per 24 hour  Intake    240 ml  Output   1200 ml  Net   -960 ml    CBG (last 3)   Recent Labs  01/03/14 1643 01/03/14 2059 01/04/14 0712  GLUCAP 123* 140* 106*   Lab Results  Component Value Date   WBC 13.8* 12/29/2013   HGB 8.4* 12/29/2013   HCT 27.1* 12/29/2013   MCV 86.6 12/29/2013   PLT 394 12/29/2013    HEENT: normal Cardio: RRR and no murmurs Resp: CTA B/L and unlabored GI: BS positive and nontender nondistended Extremity:  No Edema  Skin:   Other Ace wrap on the Right stump, Neuro: Alert/Oriented and Abnormal Motor decreased left knee flexion extension inhibited by pain Musc/Skel:  Other  Right BKA Gen NAD  Obese   Assessment/Plan: 1. Functional deficits secondary to Right BKA2. DVT Prophylaxis/Anticoagulation: Pharmaceutical: Lovenox 3. Pain Management: Premedicate prior to therapy sessions. Continues to require IV dilaudid in addition to hydrocodone --will escalate to oxycodone prn and d/c dilaudid.  4. Mood: team to provide ego support. Will add lexapro for mood stabilization. LCSW to follow for evaluation and support. Will need psych follow up also.  5. Neuropsych: This patient is capable of making decisions on his own behalf. 6. Skin/Wound Care: Daily dressing changes to left buttock wound. Routine pressure relief measures. Monitor wound daily for healing. Maintain adequate nutrition. .  7. Fluids/Electrolytes/Nutrition: Monitor I/O. Add protein supplements to help with low protein stores.  8. Reactive leucocytosis: Resolving. Recheck in am. Monitor wound and temperature curve daily.  9. DM type 2: Po intake remains poor. On Lantus to prevent hypoglycemic episodes. Will need to be changed to 70/30 prior to d/c home due to financial constraints. Will monitor BS with ac/hs checks and titrate insulin as indicated. Reinforce importance of medication compliance.  10. CKD: Monitor renal status closely as on IV Vancomycin. Avoid other nephrotoxic drugs. Will recheck lytes in am.  11. HTN: Monitor BP tid on norvasc. Off prinizide and lasix due to AKI on CKD. Baseline Cr- 1.78 but is stabilizing to 2.3 range.  12. Acute on chronic anemia: Low iron  stores with high ferritin levels. Likely poor absorption. Transfused with 2 units on 12/17. Recheck labs on Monday.    LOS (Days) 9 A FACE TO FACE EVALUATION WAS PERFORMED  Nyoka Cowden 01/04/2014, 8:01 AM

## 2014-01-04 NOTE — Progress Notes (Signed)
1610 Patient visiting with family and reports "feeling better".  Requests pain medication (see MAR) and Tylenol given for low grade temp.

## 2014-01-05 ENCOUNTER — Inpatient Hospital Stay (HOSPITAL_COMMUNITY): Payer: Self-pay

## 2014-01-05 ENCOUNTER — Inpatient Hospital Stay (HOSPITAL_COMMUNITY): Payer: Medicaid Other | Admitting: Physical Therapy

## 2014-01-05 ENCOUNTER — Encounter (HOSPITAL_COMMUNITY): Payer: Self-pay

## 2014-01-05 DIAGNOSIS — K5909 Other constipation: Secondary | ICD-10-CM

## 2014-01-05 LAB — GLUCOSE, CAPILLARY
GLUCOSE-CAPILLARY: 131 mg/dL — AB (ref 70–99)
GLUCOSE-CAPILLARY: 120 mg/dL — AB (ref 70–99)
Glucose-Capillary: 98 mg/dL (ref 70–99)
Glucose-Capillary: 110 mg/dL — ABNORMAL HIGH (ref 70–99)
Glucose-Capillary: 130 mg/dL — ABNORMAL HIGH (ref 70–99)

## 2014-01-05 LAB — BASIC METABOLIC PANEL
ANION GAP: 6 (ref 5–15)
BUN: 19 mg/dL (ref 6–23)
CHLORIDE: 107 meq/L (ref 96–112)
CO2: 23 mmol/L (ref 19–32)
Calcium: 8 mg/dL — ABNORMAL LOW (ref 8.4–10.5)
Creatinine, Ser: 2.1 mg/dL — ABNORMAL HIGH (ref 0.50–1.35)
GFR calc non Af Amer: 39 mL/min — ABNORMAL LOW (ref >90)
GFR, EST AFRICAN AMERICAN: 45 mL/min — AB (ref >90)
Glucose, Bld: 113 mg/dL — ABNORMAL HIGH (ref 70–99)
Potassium: 4.1 mmol/L (ref 3.5–5.1)
SODIUM: 136 mmol/L (ref 135–145)

## 2014-01-05 LAB — CBC
HCT: 24.8 % — ABNORMAL LOW (ref 39.0–52.0)
Hemoglobin: 7.8 g/dL — ABNORMAL LOW (ref 13.0–17.0)
MCH: 26.4 pg (ref 26.0–34.0)
MCHC: 31.5 g/dL (ref 30.0–36.0)
MCV: 84.1 fL (ref 78.0–100.0)
PLATELETS: 415 10*3/uL — AB (ref 150–400)
RBC: 2.95 MIL/uL — ABNORMAL LOW (ref 4.22–5.81)
RDW: 14.4 % (ref 11.5–15.5)
WBC: 9.3 10*3/uL (ref 4.0–10.5)

## 2014-01-05 LAB — PROTIME-INR
INR: 2.88 — AB (ref 0.00–1.49)
PROTHROMBIN TIME: 30.4 s — AB (ref 11.6–15.2)

## 2014-01-05 MED ORDER — PANTOPRAZOLE SODIUM 40 MG PO TBEC
40.0000 mg | DELAYED_RELEASE_TABLET | Freq: Two times a day (BID) | ORAL | Status: DC
  Administered 2014-01-05 – 2014-01-20 (×32): 40 mg via ORAL
  Filled 2014-01-05 (×30): qty 1

## 2014-01-05 MED ORDER — WARFARIN 0.5 MG HALF TABLET
0.5000 mg | ORAL_TABLET | Freq: Once | ORAL | Status: AC
  Administered 2014-01-05: 0.5 mg via ORAL
  Filled 2014-01-05: qty 1

## 2014-01-05 MED ORDER — GLUCERNA SHAKE PO LIQD
237.0000 mL | Freq: Two times a day (BID) | ORAL | Status: DC
  Administered 2014-01-07 – 2014-01-20 (×8): 237 mL via ORAL

## 2014-01-05 MED ORDER — SORBITOL 70 % SOLN
60.0000 mL | Status: AC
  Administered 2014-01-05: 60 mL via ORAL
  Filled 2014-01-05 (×2): qty 60

## 2014-01-05 NOTE — Plan of Care (Signed)
Problem: RH BOWEL ELIMINATION Goal: RH STG MANAGE BOWEL WITH ASSISTANCE STG Manage Bowel with min Assistance.  Outcome: Not Progressing Pt has not had a BM since 12/23 and is refusing laxatives. Pt educated Goal: RH STG MANAGE BOWEL W/MEDICATION W/ASSISTANCE STG Manage Bowel with Medication with min Assistance.  Outcome: Not Progressing Pt refusing additional laxatives. No BM sine 12/23

## 2014-01-05 NOTE — Progress Notes (Signed)
Patient has not had a BM since 12/31/13 and is refusing laxatives. Pt was educated on the need for a laxative because of the amount of time that has passed since last BM. Pt still declines at this time. Will continue to monitor

## 2014-01-05 NOTE — Progress Notes (Signed)
ANTICOAGULATION CONSULT NOTE - Follow Up Consult  Pharmacy Consult for coumadin Indication: hx of DVT/PE  No Known Allergies  Patient Measurements: Weight: (!) 373 lb 3.2 oz (169.282 kg) Heparin Dosing Weight:   Vital Signs: Temp: 99.5 F (37.5 C) (12/28 0609) Temp Source: Oral (12/28 0609) BP: 149/74 mmHg (12/28 0609) Pulse Rate: 104 (12/28 0609)  Labs:  Recent Labs  01/03/14 0500 01/04/14 0455 01/05/14 0515  HGB  --   --  7.8*  HCT  --   --  24.8*  PLT  --   --  415*  LABPROT 30.8* 31.4* 30.4*  INR 2.92* 3.00* 2.88*  CREATININE  --   --  2.10*    Estimated Creatinine Clearance: 79.7 mL/min (by C-G formula based on Cr of 2.1).   Medications:  Scheduled:  . amLODipine  10 mg Oral Daily  . calcium carbonate  1 tablet Oral TID WC  . carvedilol  6.25 mg Oral BID WC  . escitalopram  20 mg Oral QHS  . feeding supplement (GLUCERNA SHAKE)  237 mL Oral Q1500  . feeding supplement (PRO-STAT SUGAR FREE 64)  30 mL Oral TID WC  . furosemide  20 mg Oral Daily  . insulin aspart  0-15 Units Subcutaneous TID WC  . insulin glargine  10 Units Subcutaneous QHS  . pantoprazole  40 mg Oral BID  . pravastatin  20 mg Oral q1800  . senna-docusate  2 tablet Oral BID  . sorbitol  45 mL Oral Once  . sorbitol  60 mL Oral NOW  . tamsulosin  0.4 mg Oral QPC supper  . Warfarin - Pharmacist Dosing Inpatient   Does not apply q1800   Infusions:    Assessment: 52 male with hx of DVT/PE is currently on therapeutic coumadin.  INR today is down to 2.88 after holding dose last night. Goal of Therapy:  INR 2-3 Monitor platelets by anticoagulation protocol: Yes   Plan:  - coumadin 0.5 mg po x1 - INR in am  [name], [name] 01/05/2014,8:30 AM

## 2014-01-05 NOTE — Progress Notes (Signed)
Subjective/Complaints: Persistent nausea over the weekend. Phenergan helps some. No BM, is eating little by chart(he denies that) Objective: Vital Signs: Blood pressure 149/74, pulse 104, temperature 99.5 F (37.5 C), temperature source Oral, resp. rate 20, weight 169.282 kg (373 lb 3.2 oz), SpO2 94 %. No results found. Results for orders placed or performed during the hospital encounter of 12/26/13 (from the past 72 hour(s))  Glucose, capillary     Status: None   Collection Time: 01/02/14 11:28 AM  Result Value Ref Range   Glucose-Capillary 96 70 - 99 mg/dL  Glucose, capillary     Status: Abnormal   Collection Time: 01/02/14 10:19 PM  Result Value Ref Range   Glucose-Capillary 139 (H) 70 - 99 mg/dL  Protime-INR     Status: Abnormal   Collection Time: 01/03/14  5:00 AM  Result Value Ref Range   Prothrombin Time 30.8 (H) 11.6 - 15.2 seconds   INR 2.92 (H) 0.00 - 1.49  Glucose, capillary     Status: Abnormal   Collection Time: 01/03/14  6:40 AM  Result Value Ref Range   Glucose-Capillary 132 (H) 70 - 99 mg/dL  Glucose, capillary     Status: Abnormal   Collection Time: 01/03/14 11:37 AM  Result Value Ref Range   Glucose-Capillary 156 (H) 70 - 99 mg/dL   Comment 1 Notify RN   Glucose, capillary     Status: Abnormal   Collection Time: 01/03/14  2:05 PM  Result Value Ref Range   Glucose-Capillary 130 (H) 70 - 99 mg/dL  Glucose, capillary     Status: Abnormal   Collection Time: 01/03/14  4:43 PM  Result Value Ref Range   Glucose-Capillary 123 (H) 70 - 99 mg/dL   Comment 1 Notify RN   Glucose, capillary     Status: Abnormal   Collection Time: 01/03/14  8:59 PM  Result Value Ref Range   Glucose-Capillary 140 (H) 70 - 99 mg/dL  Protime-INR     Status: Abnormal   Collection Time: 01/04/14  4:55 AM  Result Value Ref Range   Prothrombin Time 31.4 (H) 11.6 - 15.2 seconds   INR 3.00 (H) 0.00 - 1.49  Glucose, capillary     Status: Abnormal   Collection Time: 01/04/14  7:12 AM   Result Value Ref Range   Glucose-Capillary 106 (H) 70 - 99 mg/dL  Glucose, capillary     Status: Abnormal   Collection Time: 01/04/14 11:37 AM  Result Value Ref Range   Glucose-Capillary 108 (H) 70 - 99 mg/dL  Glucose, capillary     Status: Abnormal   Collection Time: 01/04/14  4:46 PM  Result Value Ref Range   Glucose-Capillary 142 (H) 70 - 99 mg/dL  Glucose, capillary     Status: Abnormal   Collection Time: 01/04/14  9:29 PM  Result Value Ref Range   Glucose-Capillary 130 (H) 70 - 99 mg/dL   Comment 1 Notify RN   Protime-INR     Status: Abnormal   Collection Time: 01/05/14  5:15 AM  Result Value Ref Range   Prothrombin Time 30.4 (H) 11.6 - 15.2 seconds   INR 2.88 (H) 0.00 - 1.49  CBC     Status: Abnormal   Collection Time: 01/05/14  5:15 AM  Result Value Ref Range   WBC 9.3 4.0 - 10.5 K/uL   RBC 2.95 (L) 4.22 - 5.81 MIL/uL   Hemoglobin 7.8 (L) 13.0 - 17.0 g/dL   HCT 24.8 (L) 39.0 - 52.0 %  MCV 84.1 78.0 - 100.0 fL   MCH 26.4 26.0 - 34.0 pg   MCHC 31.5 30.0 - 36.0 g/dL   RDW 14.4 11.5 - 15.5 %   Platelets 415 (H) 150 - 400 K/uL  Basic metabolic panel     Status: Abnormal   Collection Time: 01/05/14  5:15 AM  Result Value Ref Range   Sodium 136 135 - 145 mmol/L    Comment: Please note change in reference range.   Potassium 4.1 3.5 - 5.1 mmol/L    Comment: Please note change in reference range.   Chloride 107 96 - 112 mEq/L   CO2 23 19 - 32 mmol/L   Glucose, Bld 113 (H) 70 - 99 mg/dL   BUN 19 6 - 23 mg/dL   Creatinine, Ser 2.10 (H) 0.50 - 1.35 mg/dL   Calcium 8.0 (L) 8.4 - 10.5 mg/dL   GFR calc non Af Amer 39 (L) >90 mL/min   GFR calc Af Amer 45 (L) >90 mL/min    Comment: (NOTE) The eGFR has been calculated using the CKD EPI equation. This calculation has not been validated in all clinical situations. eGFR's persistently <90 mL/min signify possible Chronic Kidney Disease.    Anion gap 6 5 - 15  Glucose, capillary     Status: Abnormal   Collection Time:  01/05/14  6:46 AM  Result Value Ref Range   Glucose-Capillary 110 (H) 70 - 99 mg/dL   Comment 1 Notify RN      HEENT: normal Cardio: RRR and no murmurs Resp: CTA B/L and unlabored GI: BS positive and nontender nondistended Extremity:  Edema left stump Skin:   Other Ace wrap on the Right stump, Neuro: Alert/Oriented and Abnormal Motor decreased left knee flexion extension inhibited by pain Musc/Skel:  Other Right BKA clean and intact with minimal s/s drainage Gen NAD   Assessment/Plan: 1. Functional deficits secondary to Right BKA which require 3+ hours per day of interdisciplinary therapy in a comprehensive inpatient rehab setting. Physiatrist is providing close team supervision and 24 hour management of active medical problems listed below. Physiatrist and rehab team continue to assess barriers to discharge/monitor patient progress toward functional and medical goals. FIM: FIM - Bathing Bathing Steps Patient Completed: Chest, Right Arm, Left Arm, Front perineal area Bathing: 3: Mod-Patient completes 5-7 16f10 parts or 50-74%  FIM - Upper Body Dressing/Undressing Upper body dressing/undressing steps patient completed: Thread/unthread left sleeve of pullover shirt/dress, Put head through opening of pull over shirt/dress, Thread/unthread right sleeve of pullover shirt/dresss, Pull shirt over trunk Upper body dressing/undressing: 5: Set-up assist to: Obtain clothing/put away FIM - Lower Body Dressing/Undressing Lower body dressing/undressing steps patient completed: Thread/unthread right underwear leg, Thread/unthread left underwear leg, Thread/unthread right pants leg, Thread/unthread left pants leg Lower body dressing/undressing: 1: Two helpers  FIM - Toileting Toileting steps completed by patient: Performs perineal hygiene Toileting: 2: Max-Patient completed 1 of 3 steps  FIM - TRadio producerDevices: BEngineer, civil (consulting) WInsurance account managerTransfers: 4-To  toilet/BSC: Min A (steadying Pt. > 75%), 4-From toilet/BSC: Min A (steadying Pt. > 75%)  FIM - BControl and instrumentation engineerDevices: Sliding board, Arm rests Bed/Chair Transfer: 5: Supine > Sit: Supervision (verbal cues/safety issues), 1: Two helpers  FIM - Locomotion: Wheelchair Distance: 150 Locomotion: Wheelchair: 1: Total Assistance/staff pushes wheelchair (Pt<25%) FIM - Locomotion: Ambulation Locomotion: Ambulation Assistive Devices: WAdministratorAmbulation/Gait Assistance: 1: +2 Total assist Locomotion: Ambulation: 0: Activity did not occur  Comprehension Comprehension  Mode: Auditory Comprehension: 5-Follows basic conversation/direction: With extra time/assistive device  Expression Expression Mode: Verbal Expression: 5-Expresses basic needs/ideas: With extra time/assistive device  Social Interaction Social Interaction: 4-Interacts appropriately 75 - 89% of the time - Needs redirection for appropriate language or to initiate interaction.  Problem Solving Problem Solving: 5-Solves basic 90% of the time/requires cueing < 10% of the time  Memory Memory: 6-More than reasonable amt of time  Medical Problem List and Plan: 1. Functional deficits secondary to R-BKA, also severely deconditioned 2. DVT Prophylaxis/Anticoagulation: Pharmaceutical: Lovenox 3. Pain Management: Premedicate prior to therapy sessions. Continues to require IV dilaudid in addition to hydrocodone --will escalate to oxycodone prn and d/c dilaudid.  4. Mood: team to provide ego support. Added lexapro for mood stabilization. LCSW to follow for evaluation and support.  .  5. Neuropsych: This patient is capable of making decisions on his own behalf. 6. Skin/Wound Care: Daily dressing changes to left buttock wound. Routine pressure relief measures. Monitor wound daily for healing. Maintain adequate nutrition. .  7. Fluids/Electrolytes/Nutrition: Monitor I/O. Added protein supplements to  help with low protein stores.  8. Reactive leucocytosis: Resolving . Monitor wound and temperature curve daily. Low grade temp still  9. DM type 2: Po intake remains poor. On Lantus to prevent hypoglycemic episodes. Will need to be changed to 70/30 prior to d/c home due to financial constraints. Will monitor BS with ac/hs checks and titrate insulin as indicated. .  10. CKD: Monitor renal status closely as on IV Vancomycin. Avoid other nephrotoxic drugs.   11. HTN: Monitor BP tid on norvasc. Off prinizide and lasix due to AKI on CKD. Baseline Cr- 1.78 but is stabilizing to 2.3 range.  12. Acute on chronic anemia: Low iron stores with high ferritin levels. Likely poor absorption. Transfused with 2 units on 12/17. Recheck labs on Monday.  13. Left buttock wound: S/p I & D 12/14--antibiotic Day # 14/14  14. Resting tachycardia: HR in 110 range. Multifactorial due to obesity  anemia as well as deconditioning. Will monitor for symptoms with activity. May need low dose BB.  15.  Atelectasis encurage IS 16. Nausea---likely multifactorial due to abx, constipation  -sorbitol today,  -abx complete today  -phenergan prn  LOS (Days) 10 A FACE TO FACE EVALUATION WAS PERFORMED  [name] T 01/05/2014, 8:33 AM

## 2014-01-05 NOTE — Progress Notes (Signed)
Patient did not take his medications that were administered at 1749 and 1751, wasted all 7 medications that were in the cup,wasted medications in pyxis, made note in [facility] regarding medications, called pharmacy to get instructions on how to edit [facility] charting regarding patient not taking medications, pharmacy unable to provide guidance, was instructed to just make a note.  Oval Linsey, RN

## 2014-01-05 NOTE — Progress Notes (Signed)
NUTRITION FOLLOW UP  DOCUMENTATION CODES Per approved criteria  -Morbid Obesity   INTERVENTION: Continue 30 ml Prostat po TID, each supplement provides 100 kcal and 15 grams of protein.   Provide Glucerna Shake po BID daily, each supplement provides 220 kcal and 10 grams of protein.  Encourage adequate PO intake.  NUTRITION DIAGNOSIS: Inadequate oral intake related to nausea as evidenced by meal completion of 10-50%; ongoing  Goal: Pt to meet >/= 90% of their estimated nutrition needs; not met  Monitor:  PO intake, weight trends, labs, I/O's  38 y.o. male  Admitting Dx: Amputation of right lower extremity below knee  ASSESSMENT: Pt with history of DM type 2 with diabetic foot ulcer, morbid obesity, HTN, CKD, on chronic coumadin for recurrent DVT/PE, medical non-compliance; who was admitted via MD office on 12/02 with fever chills, N/V and right foot pain. He underwent R-BKA with I and D right buttock abscess on 12/22/13.  Pt reports his appetite has been increasing a bit, however still has been having nausea. Meal completion has been 0-20%. Pt reports he has been drinking his Glucerna Shakes, but has been struggling with taking his Prostat as he does not like the taste. RD to increase Glucerna Shake to BID to help with caloric and protein needs. Pt was encouraged to eat his food at meals and to take his supplements.  Labs and medication reviewed.  Height: Ht Readings from Last 1 Encounters:  12/10/13 6' 2" (1.88 m)    Weight: Wt Readings from Last 1 Encounters:  01/01/14 373 lb 3.2 oz (169.282 kg)   BMI:  Body mass index is 47.9 kg/(m^2). Morbid obesity  Re-Estimated Nutritional Needs: Kcal: 2300-2600 Protein: 130-150 grams Fluid: 2.4 - 2.6 L/day  Skin: +2 RLE, +2 LLE edema, incision right hip, incision right leg  Diet Order: Diet regular   Intake/Output Summary (Last 24 hours) at 01/05/14 1419 Last data filed at 01/05/14 0845  Gross per 24 hour  Intake       0 ml  Output   1825 ml  Net  -1825 ml    Last BM: 12/23  Labs:   Recent Labs Lab 12/31/13 0500 01/05/14 0515  NA 135 136  K 4.1 4.1  CL 109 107  CO2 21 23  BUN 21 19  CREATININE 2.30* 2.10*  CALCIUM 7.8* 8.0*  GLUCOSE 86 113*    CBG (last 3)   Recent Labs  01/04/14 2129 01/05/14 0646 01/05/14 1216  GLUCAP 130* 110* 130*    Scheduled Meds: . amLODipine  10 mg Oral Daily  . calcium carbonate  1 tablet Oral TID WC  . carvedilol  6.25 mg Oral BID WC  . escitalopram  20 mg Oral QHS  . feeding supplement (GLUCERNA SHAKE)  237 mL Oral Q1500  . feeding supplement (PRO-STAT SUGAR FREE 64)  30 mL Oral TID WC  . furosemide  20 mg Oral Daily  . insulin aspart  0-15 Units Subcutaneous TID WC  . insulin glargine  10 Units Subcutaneous QHS  . pantoprazole  40 mg Oral BID  . pravastatin  20 mg Oral q1800  . senna-docusate  2 tablet Oral BID  . sorbitol  45 mL Oral Once  . sorbitol  60 mL Oral NOW  . tamsulosin  0.4 mg Oral QPC supper  . warfarin  0.5 mg Oral ONCE-1800  . Warfarin - Pharmacist Dosing Inpatient   Does not apply q1800    Continuous Infusions:   Past Medical History  Diagnosis Date  . DVT (deep venous thrombosis) 09/2002    patient reports additional DVTs in '06 & '11 (unconfirmed)  . Pulmonary embolism 09/2002    treated with 6 months of warfarin  . Hypertension   . Chest pain, neg MI, normal coronaries by cath 02/18/2013  . Hyperlipidemia 02/19/2013  . Acute venous embolism and thrombosis of deep vessels of proximal lower extremity 07/19/2011  . Type I diabetes mellitus dx'd 2001  . GERD (gastroesophageal reflux disease)   . Diabetic ulcer of right foot   . CKD (chronic kidney disease) stage 3, GFR 30-59 ml/min 02/19/2013  . Nephrotic syndrome     Past Surgical History  Procedure Laterality Date  . Incision and drainage abscess  2007; 2015    "back"  . Cardiac catheterization  02/18/2013    normal coronaries  . I&d extremity Right 12/14/2013     Procedure: IRRIGATION AND DEBRIDEMENT RIGHT FOOT;  Surgeon: [name], MD;  Location: [place];  Service: Orthopedics;  Laterality: Right;  . Left heart catheterization with coronary angiogram N/A 02/18/2013    Procedure: LEFT HEART CATHETERIZATION WITH CORONARY ANGIOGRAM;  Surgeon: [name], MD;  Location: [facility] CATH LAB;  Service: Cardiovascular;  Laterality: N/A;  . Amputation Right 12/22/2013    Procedure: AMPUTATION BELOW KNEE;  Surgeon: [name], MD;  Location: [place];  Service: Orthopedics;  Laterality: Right;  . Incision and drainage of wound Right 12/22/2013    Procedure: I&D RIGHT BUTTOCK;  Surgeon: [name], MD;  Location: [place];  Service: Orthopedics;  Laterality: Right;    Kallie Locks, MS, RD, LDN Pager # [phone] After hours/ weekend pager # [phone]

## 2014-01-05 NOTE — Progress Notes (Signed)
Staff and significant other encouraged pt to take the sorbitol 55ml as ordered. Sorbitol given with pt having large loose BM on BSC.  Pt states some relief. Will continue to monitor.

## 2014-01-05 NOTE — Progress Notes (Signed)
Pt last BM 12-23. Pt had one time order for sorbitol 51ml.  With multiple attempts pt refused sorbitol. Educated pt on importance of regular bowel movements, issues that can arise from not moving bowels, and how medication can cause constipation. Pt continued to refuse medication. Will continue to educate.

## 2014-01-05 NOTE — Progress Notes (Signed)
Occupational Therapy Weekly Progress Note  Patient Details  Name: [name] MRN: [id number] Date of Birth: 08-Oct-1975  Beginning of progress report period: December 27, 2013 End of progress report period: January 05, 2014  Today's Date: 01/05/2014 OT Individual Time: 0830-1000 OT Individual Time Calculation (min): 90 min    Patient has met 0 of 4 short term goals.  Plan of care revised to reflect new goals set at w/c level and performance of BADL at edge of bed and/or supine.  Patient continues to demonstrate the following deficits:  Poor endurance, impaired transfer skills, pain at RLE, impaired dynamic sitting balance, inadequate toileting skills, general UE weakness and inadeuate discharge plan and therefore will continue to benefit from skilled OT intervention to enhance overall performance with BADL and iADL.  Patient not progressing toward long term goals..  See revised plan of care.  OT Short Term Goals Week 1:  OT Short Term Goal 1 (Week 1): Pt will complete bathing with mod assist at sit > stand level OT Short Term Goal 1 - Progress (Week 1): Revised due to lack of progress OT Short Term Goal 2 (Week 1): Pt will complete LB dressing with mod assist at sit > stand level OT Short Term Goal 2 - Progress (Week 1): Revised due to lack of progress OT Short Term Goal 3 (Week 1): Pt will complete toilet transfer with max assist of 1 caregiver OT Short Term Goal 3 - Progress (Week 1): Revised due to lack of progress OT Short Term Goal 4 (Week 1): Pt will tolerate out of bed activity for full 60 minute session with no episodes of nausea or vomiting OT Short Term Goal 4 - Progress (Week 1): Met Week 2:  OT Short Term Goal 1 (Week 2): Pt will complete bathing at edge of bed and supine with min assist. OT Short Term Goal 2 (Week 2): Pt will complete transfer from w/c to [facility] with steadying assist OT Short Term Goal 3 (Week 2): Pt will don left sock and shoe using AE independently OT  Short Term Goal 4 (Week 2): Pt will demo ability to complete simple meal prep at w/c level with setup assist OT Short Term Goal 5 (Week 2): Pt will complete UE HEP with supervision  Skilled Therapeutic Interventions/Progress Updates: ADL-retraining with focus on slide board transfers, adapted bathing/dressing skills, sit>stand, static standing balance, and discharge planning.   Pt received supine in bed, reporting moderate pain but affirming ability to participate in planned BADL session.    With extra time pt rose to sit at edge of bed and performed slide board transfer to w/c with setup assist to place board and steadying assist during transfer to maintain foot contact and verbal cues for technique.    Pt was escorted to walk-in shower and encouraged to use grab bars to perform squat pivot versus use slide board d/t poor access for caregiver assist.   Pt required extra time and verbal cues for problem-solving to complete transfer w/o physical assist required.    Pt completed bathing seated on bench but was unable to wash buttocks.   Pt returned to w/c after bathing near end of session and dressed seated at sink, and laced his underwear and pants with verbal cues for technique but required max assist to don clothing while standing at sink, supported by RW.   Pt dressed upper body as neuropyschologist arrived for next treatment session.   Pt continues to require extra time to process instructions and  execute motor plan.   He has not attempted use of BSC to toilet to date.   Plan to introduce use of AE for lower body dressing and toileting (prn) during following sessions.    Therapy Documentation Precautions:  Precautions Precautions: Fall Precaution Comments: New Rt BKA, with protector Required Braces or Orthoses: Knee Immobilizer - Right (limb guard) Restrictions Weight Bearing Restrictions: Yes RLE Weight Bearing: Non weight bearing Other Position/Activity Restrictions: new right BKA  Pain: Pain  Assessment Pain Score: 10-Worst pain ever Pain Type: Surgical pain Pain Location: Leg Pain Orientation: Right Pain Descriptors / Indicators: Aching;Discomfort;Nagging;Throbbing Pain Intervention(s): Medication (See eMAR)  See FIM for current functional status  Therapy/Group: Individual Therapy  [place] 01/05/2014, 12:28 PM

## 2014-01-05 NOTE — Progress Notes (Signed)
Physical Therapy Session Note  Patient Details  Name: [name] MRN: [id number] Date of Birth: Mar 20, 1975  Today's Date: 01/05/2014 PT Individual Time: 1300-1315 PT Individual Time Calculation (min): 15 min  and Today's Date: 01/05/2014 PT Missed Time: 75 Minutes Missed Time Reason: Toileting  Short Term Goals: Week 2:  PT Short Term Goal 1 (Week 2): = LTG which have been downgraded due to lack of progress  Skilled Therapeutic Interventions/Progress Updates:    Pt received seated in w/c, agreeable to participate in therapy. Pt reported satisfaction with slide board for transfers. Pt reported need for bowel movement. Therapist placed bedside commode and set up slide board transfer, pt required Min steadying assist and max cueing for hand placement and safe sequencing to complete transfer. Per RN pt has not had bowel movement since 12/23, so pt allowed time to move bowels. After 60 minutes pt was still on [facility] and reported he was not finished. Pt missed 75 minutes scheduled therapy time. RN informed of pt's position, pt left seated on BSC w/ all needs within reach.   Therapy Documentation Precautions:  Precautions Precautions: Fall Precaution Comments: New Rt BKA, with protector Required Braces or Orthoses: Knee Immobilizer - Right (limb guard) Restrictions Weight Bearing Restrictions: Yes RLE Weight Bearing: Non weight bearing Other Position/Activity Restrictions: new right BKA Pain:  While toileting pt reported increased stomach pain. RN made aware and pt provided with diet ginger ale. Per request  See FIM for current functional status  Therapy/Group: Individual Therapy  [name]  [name], PT, DPT 01/05/2014, 7:33 AM

## 2014-01-05 NOTE — Progress Notes (Signed)
Physical Therapy Session Note  Patient Details  Name: [name] MRN: [id number] Date of Birth: [birth year]  Today's Date: 01/05/2014 PT Individual Time: 1630-1700 PT Individual Time Calculation (min): 30 min   Short Term Goals: Week 2:  PT Short Term Goal 1 (Week 2): = LTG which have been downgraded due to lack of progress  Skilled Therapeutic Interventions/Progress Updates:    Pt received supine in bed, agreeable to participate in therapy. Pt moved supine>sit w/ supervision, donned limbguard w/ MaxA. Pt completed slide board transfer bed>w/c w/ overall MinA for slide board placement, max cueing for anterior weight shift, hand placement, use of LLE. Pt propelled w/c 150' w/ SBA and extra time w/ BUE. Pt w/ 2 bouts of ambulation 5 steps each w/ bari RW and Min physical assist from therapist to block wheels of RW during hop forward, +2 assist for close w/c follow. Pt requires close w/c follow due to decreased standing tolerance, frequent sudden stand>sits. Pt left seated in w/c in family room w/ girlfriend present.    Therapy Documentation Precautions:  Precautions Precautions: Fall Precaution Comments: New Rt BKA, with protector Required Braces or Orthoses: Knee Immobilizer - Right (limb guard) Restrictions Weight Bearing Restrictions: Yes RLE Weight Bearing: Non weight bearing Other Position/Activity Restrictions: new right BKA Pain:  No/denies pain Locomotion : Ambulation Ambulation/Gait Assistance: 1: +2 Total assist (+2 for close w/c follow)   See FIM for current functional status  Therapy/Group: Individual Therapy  [name] 01/05/2014, 5:04 PM

## 2014-01-06 ENCOUNTER — Inpatient Hospital Stay (HOSPITAL_COMMUNITY): Payer: Self-pay

## 2014-01-06 ENCOUNTER — Inpatient Hospital Stay (HOSPITAL_COMMUNITY): Payer: Medicaid Other

## 2014-01-06 LAB — GLUCOSE, CAPILLARY
Glucose-Capillary: 100 mg/dL — ABNORMAL HIGH (ref 70–99)
Glucose-Capillary: 105 mg/dL — ABNORMAL HIGH (ref 70–99)
Glucose-Capillary: 111 mg/dL — ABNORMAL HIGH (ref 70–99)
Glucose-Capillary: 112 mg/dL — ABNORMAL HIGH (ref 70–99)

## 2014-01-06 LAB — PROTIME-INR
INR: 2.55 — ABNORMAL HIGH (ref 0.00–1.49)
Prothrombin Time: 27.6 seconds — ABNORMAL HIGH (ref 11.6–15.2)

## 2014-01-06 MED ORDER — CARVEDILOL 12.5 MG PO TABS
12.5000 mg | ORAL_TABLET | Freq: Two times a day (BID) | ORAL | Status: DC
  Administered 2014-01-06 – 2014-01-20 (×29): 12.5 mg via ORAL
  Filled 2014-01-06 (×30): qty 1

## 2014-01-06 MED ORDER — WARFARIN SODIUM 1 MG PO TABS
1.0000 mg | ORAL_TABLET | Freq: Once | ORAL | Status: AC
  Administered 2014-01-06: 1 mg via ORAL
  Filled 2014-01-06 (×2): qty 1

## 2014-01-06 NOTE — Progress Notes (Addendum)
Subjective/Complaints: Refusing meds at times. Rested comfortably. Had  2 bm's yesterday  Objective: Vital Signs: Blood pressure 173/95, pulse 107, temperature 99.3 F (37.4 C), temperature source Oral, resp. rate 20, weight 171.1 kg (377 lb 3.3 oz), SpO2 93 %. No results found. Results for orders placed or performed during the hospital encounter of 12/26/13 (from the past 72 hour(s))  Glucose, capillary     Status: Abnormal   Collection Time: 01/03/14 11:37 AM  Result Value Ref Range   Glucose-Capillary 156 (H) 70 - 99 mg/dL   Comment 1 Notify RN   Glucose, capillary     Status: Abnormal   Collection Time: 01/03/14  2:05 PM  Result Value Ref Range   Glucose-Capillary 130 (H) 70 - 99 mg/dL  Glucose, capillary     Status: Abnormal   Collection Time: 01/03/14  4:43 PM  Result Value Ref Range   Glucose-Capillary 123 (H) 70 - 99 mg/dL   Comment 1 Notify RN   Glucose, capillary     Status: Abnormal   Collection Time: 01/03/14  8:59 PM  Result Value Ref Range   Glucose-Capillary 140 (H) 70 - 99 mg/dL  Protime-INR     Status: Abnormal   Collection Time: 01/04/14  4:55 AM  Result Value Ref Range   Prothrombin Time 31.4 (H) 11.6 - 15.2 seconds   INR 3.00 (H) 0.00 - 1.49  Glucose, capillary     Status: Abnormal   Collection Time: 01/04/14  7:12 AM  Result Value Ref Range   Glucose-Capillary 106 (H) 70 - 99 mg/dL  Glucose, capillary     Status: Abnormal   Collection Time: 01/04/14 11:37 AM  Result Value Ref Range   Glucose-Capillary 108 (H) 70 - 99 mg/dL  Glucose, capillary     Status: Abnormal   Collection Time: 01/04/14  4:46 PM  Result Value Ref Range   Glucose-Capillary 142 (H) 70 - 99 mg/dL  Glucose, capillary     Status: Abnormal   Collection Time: 01/04/14  9:29 PM  Result Value Ref Range   Glucose-Capillary 130 (H) 70 - 99 mg/dL   Comment 1 Notify RN   Protime-INR     Status: Abnormal   Collection Time: 01/05/14  5:15 AM  Result Value Ref Range   Prothrombin Time  30.4 (H) 11.6 - 15.2 seconds   INR 2.88 (H) 0.00 - 1.49  CBC     Status: Abnormal   Collection Time: 01/05/14  5:15 AM  Result Value Ref Range   WBC 9.3 4.0 - 10.5 K/uL   RBC 2.95 (L) 4.22 - 5.81 MIL/uL   Hemoglobin 7.8 (L) 13.0 - 17.0 g/dL   HCT 24.8 (L) 39.0 - 52.0 %   MCV 84.1 78.0 - 100.0 fL   MCH 26.4 26.0 - 34.0 pg   MCHC 31.5 30.0 - 36.0 g/dL   RDW 14.4 11.5 - 15.5 %   Platelets 415 (H) 150 - 400 K/uL  Basic metabolic panel     Status: Abnormal   Collection Time: 01/05/14  5:15 AM  Result Value Ref Range   Sodium 136 135 - 145 mmol/L    Comment: Please note change in reference range.   Potassium 4.1 3.5 - 5.1 mmol/L    Comment: Please note change in reference range.   Chloride 107 96 - 112 mEq/L   CO2 23 19 - 32 mmol/L   Glucose, Bld 113 (H) 70 - 99 mg/dL   BUN 19 6 - 23 mg/dL  Creatinine, Ser 2.10 (H) 0.50 - 1.35 mg/dL   Calcium 8.0 (L) 8.4 - 10.5 mg/dL   GFR calc non Af Amer 39 (L) >90 mL/min   GFR calc Af Amer 45 (L) >90 mL/min    Comment: (NOTE) The eGFR has been calculated using the CKD EPI equation. This calculation has not been validated in all clinical situations. eGFR's persistently <90 mL/min signify possible Chronic Kidney Disease.    Anion gap 6 5 - 15  Glucose, capillary     Status: Abnormal   Collection Time: 01/05/14  6:46 AM  Result Value Ref Range   Glucose-Capillary 110 (H) 70 - 99 mg/dL   Comment 1 Notify RN   Glucose, capillary     Status: Abnormal   Collection Time: 01/05/14 12:16 PM  Result Value Ref Range   Glucose-Capillary 130 (H) 70 - 99 mg/dL  Glucose, capillary     Status: Abnormal   Collection Time: 01/05/14  5:08 PM  Result Value Ref Range   Glucose-Capillary 131 (H) 70 - 99 mg/dL  Glucose, capillary     Status: Abnormal   Collection Time: 01/05/14  9:02 PM  Result Value Ref Range   Glucose-Capillary 120 (H) 70 - 99 mg/dL  Protime-INR     Status: Abnormal   Collection Time: 01/06/14  5:00 AM  Result Value Ref Range    Prothrombin Time 27.6 (H) 11.6 - 15.2 seconds   INR 2.55 (H) 0.00 - 1.49  Glucose, capillary     Status: Abnormal   Collection Time: 01/06/14  6:43 AM  Result Value Ref Range   Glucose-Capillary 100 (H) 70 - 99 mg/dL     HEENT: normal Cardio: RRR and no murmurs Resp: CTA B/L and unlabored GI: BS positive and nontender nondistended Extremity:  Edema left stump Skin:   Wound clean with minimal drainage  Neuro: Alert/Oriented and Abnormal Motor decreased left knee flexion extension inhibited by pain Musc/Skel:  Right leg appropriately tender. Gen NAD   Assessment/Plan: 1. Functional deficits secondary to Right BKA which require 3+ hours per day of interdisciplinary therapy in a comprehensive inpatient rehab setting. Physiatrist is providing close team supervision and 24 hour management of active medical problems listed below. Physiatrist and rehab team continue to assess barriers to discharge/monitor patient progress toward functional and medical goals. FIM: FIM - Bathing Bathing Steps Patient Completed: Chest, Right Arm, Left Arm, Abdomen, Front perineal area, Right upper leg, Left upper leg, Left lower leg (including foot) Bathing: 4: Min-Patient completes 8-9 66f10 parts or 75+ percent  FIM - Upper Body Dressing/Undressing Upper body dressing/undressing steps patient completed: Thread/unthread right sleeve of pullover shirt/dresss, Thread/unthread left sleeve of pullover shirt/dress, Put head through opening of pull over shirt/dress, Pull shirt over trunk Upper body dressing/undressing: 5: Set-up assist to: Obtain clothing/put away FIM - Lower Body Dressing/Undressing Lower body dressing/undressing steps patient completed: Thread/unthread right underwear leg, Thread/unthread left underwear leg, Thread/unthread right pants leg, Thread/unthread left pants leg Lower body dressing/undressing: 2: Max-Patient completed 25-49% of tasks  FIM - Toileting Toileting steps completed by  patient: Adjust clothing prior to toileting Toileting: 1: Total-Patient completed zero steps, helper did all 3  FIM - TRadio producerDevices: Bedside commode, Sliding board Toilet Transfers: 4-To toilet/BSC: Min A (steadying Pt. > 75%)  FIM - BControl and instrumentation engineerDevices: Sliding board, Arm rests Bed/Chair Transfer: 5: Supine > Sit: Supervision (verbal cues/safety issues), 4: Bed > Chair or W/C: Min A (steadying Pt. >  75%), 4: Chair or W/C > Bed: Min A (steadying Pt. > 75%)  FIM - Locomotion: Wheelchair Distance: 150 Locomotion: Wheelchair: 5: Travels 150 ft or more: maneuvers on rugs and over door sills with supervision, cueing or coaxing FIM - Locomotion: Ambulation Locomotion: Ambulation Assistive Devices: Administrator Ambulation/Gait Assistance: 1: +2 Total assist (+2 for close w/c follow) Locomotion: Ambulation: 1: Two helpers  Comprehension Comprehension Mode: Auditory Comprehension: 5-Understands complex 90% of the time/Cues < 10% of the time  Expression Expression Mode: Verbal Expression: 5-Expresses basic needs/ideas: With extra time/assistive device  Social Interaction Social Interaction: 5-Interacts appropriately 90% of the time - Needs monitoring or encouragement for participation or interaction.  Problem Solving Problem Solving: 5-Solves complex 90% of the time/cues < 10% of the time  Memory Memory: 5-Recognizes or recalls 90% of the time/requires cueing < 10% of the time  Medical Problem List and Plan: 1. Functional deficits secondary to R-BKA, also severely deconditioned 2. DVT Prophylaxis/Anticoagulation: Pharmaceutical: Lovenox 3. Pain Management: Premedicate prior to therapy sessions. Continues to require IV dilaudid in addition to hydrocodone --will escalate to oxycodone prn and d/c dilaudid.  4. Mood: team to provide ego support. Added lexapro for mood stabilization. LCSW to follow for evaluation  and support.  .  5. Neuropsych: This patient is capable of making decisions on his own behalf. 6. Skin/Wound Care: Daily dressing changes to left buttock wound. Routine pressure relief measures. Monitor wound daily for healing. Maintain adequate nutrition. .  7. Fluids/Electrolytes/Nutrition: Monitor I/O. Added protein supplements to help with low protein stores.  8. Reactive leucocytosis: Resolving . Monitor wound and temperature curve daily. Low grade temp still  9. DM type 2:  On Lantus to prevent hypoglycemic episodes. Will need to be changed to 70/30 prior to d/c home due to financial constraints.   -fair/good control at present 10. CKD: Monitor renal status closely as on IV Vancomycin. Avoid other nephrotoxic drugs.   11. HTN: Monitor BP tid on norvasc. Off prinizide and lasix due to AKI on CKD. Baseline Cr- 1.78 but is stabilizing to 2.3 range.   -increase coreg to 12.73m bid 12. Acute on chronic anemia: Low iron stores with high ferritin levels. Likely poor absorption. Transfused with 2 units on 12/17. Recheck labs on Monday.  13. Left buttock wound: S/p I & D 12/14--antibiotics complete  14. Resting tachycardia: HR in 110 range. Multifactorial due to obesity  anemia as well as deconditioning. Will monitor for symptoms with activity. May need low dose BB.  15.  Atelectasis encurage IS 16. Nausea---likely multifactorial due to abx, constipation  -had bm's yesterday  -abx complete LOS (Days) 11 A FACE TO FACE EVALUATION WAS PERFORMED  Grisela Mesch T 01/06/2014, 8:16 AM

## 2014-01-06 NOTE — Patient Care Conference (Signed)
Inpatient RehabilitationTeam Conference and Plan of Care Update Date: 01/06/2014   Time: 9:05 AM    Patient Name: [name]      Medical Record Number: [id number]  Date of Birth: [birth year] Sex: Male         Room/Bed: [place]:[id number] Payor Info: Payor: /    Admitting Diagnosis: narendra r bka  Admit Date/Time:  12/26/2013  5:38 PM Admission Comments: No comment available   Primary Diagnosis:  Amputation of right lower extremity below knee Principal Problem: Amputation of right lower extremity below knee  Patient Active Problem List   Diagnosis Date Noted  . Constipation   . Amputation of right lower extremity below knee 12/26/2013  . Diabetic ulcer of right foot   . Cellulitis and abscess   . Abscess of right hip 12/20/2013  . Cellulitis and abscess of right foot 12/12/2013  . GERD (gastroesophageal reflux disease) 08/14/2013  . Hyperlipidemia 02/19/2013  . S/P cardiac cath, 02/18/13, normal coronaries 02/19/2013  . CKD stage 3 due to type 1 diabetes mellitus 02/19/2013  . Failure to attend appointment 06/10/2012  . Nephrotic syndrome 02/12/2012  . Essential hypertension 07/24/2011  . Acute venous embolism and thrombosis of deep vessels of proximal lower extremity 07/19/2011  . Long term (current) use of anticoagulants 07/19/2011  . Foot ulcer, right 07/12/2011  . Back pain 07/12/2011  . DEEP VENOUS THROMBOPHLEBITIS, HX OF 07/28/2009  . PROTEINURIA, MILD 02/01/2007  . Morbid obesity 10/26/2005  . DM (diabetes mellitus) type I uncontrolled, periph vascular disorder 01/09/2002    Expected Discharge Date: Expected Discharge Date: 01/14/14  Team Members Present: Physician leading conference: Dr. Alger Simons Social Worker Present: Lennart Pall, LCSW Nurse Present: Heather Roberts, RN PT Present: Canary Brim, Lorriane Shire, PT OT Present: [name], OT;Patricia Lissa Hoard, OT SLP Present: Gunnar Fusi, SLP     Current Status/Progress Goal Weekly Team Focus  Medical   right bka healing, completed abx. nauseated---better after finishing abx and bm's  improve activity tolerance  pain, wound care, nausea   Bowel/Bladder   Pt continent of bowel with use of BSC. last BM 12-28 with sorbitol used. Pt voids large amounts 2-3 times a day. PVRs low with large volume outputs. I&O cath last 12-26  manage b/b Min assist-  encourage double void. encourage and educate pt and family about stool softners and having a bowel pattern.   Swallow/Nutrition/ Hydration             ADL's   Supervision-Min A SB trasnfers, Setup UB dressing, Min A bathing and dressing  Mod I for ADL (B&D and toileting) at EOB and w/c level, supervision for tub transfers  Improved activity tolerance, SB transfer training, dynamic sitting balance, sit>stand & static standing    Mobility   Overall SBA-MinA for bed mobility, Min A for slide board transfers bed<>w/c, mod (I) for w/c propulsion. +2 A for ambulation up to 5', limited by decreased endurance and decreased strength. unable to ascend/descend stairs  MinA for community w/c propulsion, car transfers, dynamic standing, (S) for bed/chair transfers, mod (I) for dynamic sitting, bed mobility  slide board transfers, participation in therapy, w/c management   Communication             Safety/Cognition/ Behavioral Observations            Pain   pt has pain to right stump. oxycodone 15mg  prn q4hrs. Robaxin 500mg  prn q6 hrs,  4 or less  medicate as needed and prior to therapy  Skin   pt has old abcess site to right lower back. packed with iodoform and foam on to cover. daily change. skin tears to right thigh with tegaderm in place. stump has staples with guaze and ace inplace   prevent infection and dressing care with max assist; total for abcess site  cont. plan of care for dressing changes. monitotr s/s of infection    Rehab Goals Patient on target to meet rehab goals: No Rehab Goals Revised: very slow progress and limited gains *See Care Plan and  progress notes for long and short-term goals.  Barriers to Discharge: awareness, refusals    Possible Resolutions to Barriers:  education, encouragement by staff    Discharge Planning/Teaching Needs:  pt still undecided on his d/c plan and has not begun construction of a ramp - to addess with pt and girlfriend today  ongoing   Team Discussion:  Nausea most likely related to abx which ended yesterday.  Most goals have been downgraded to minimal assistance.  Very slow to process information or to begin any functional tasks.  Still with unresolved questions about d/c location and plans for a ramp.  Stump is healing well, however, still with would on bottom that requires qd packing.  Revisions to Treatment Plan:  Several goals downgraded.  Neuropsych consult completed yesterday.   Continued Need for Acute Rehabilitation Level of Care: The patient requires daily medical management by a physician with specialized training in physical medicine and rehabilitation for the following conditions: Daily direction of a multidisciplinary physical rehabilitation program to ensure safe treatment while eliciting the highest outcome that is of practical value to the patient.: Yes Daily medical management of patient stability for increased activity during participation in an intensive rehabilitation regime.: Yes Daily analysis of laboratory values and/or radiology reports with any subsequent need for medication adjustment of medical intervention for : Post surgical problems;Neurological problems  [name] 01/06/2014, 1:44 PM

## 2014-01-06 NOTE — Progress Notes (Signed)
Patient was encouraged several times to void on own before the use of bladder scanner or I&O catheter early in shift. At the time to bladder scan the patient he was able to void 675ml using the urinal.  Discussed with patient importance of voiding on own, but why we have to use the bladder scanner and I&O catheter at times.  Patient states he understands each time it is discussed but does not void until time to I&O cath again.

## 2014-01-06 NOTE — Progress Notes (Signed)
Physical Therapy Session Note  Patient Details  Name: [name] MRN: [id number] Date of Birth: [birth year]  Today's Date: 01/06/2014 PT Individual Time: 1330-1430 PT Individual Time Calculation (min): 60 min   Short Term Goals: Week 2:  PT Short Term Goal 1 (Week 2): = LTG which have been downgraded due to lack of progress  Skilled Therapeutic Interventions/Progress Updates:    Pt received seated in w/c, reporting he had just received lunch tray and requesting time to eat lunch. Pt given 15 minutes, upon therapist return pt reported he had eaten half of one chicken tender and requested more time. Pt has had very low PO intake contributing to decreased activity tolerance, so pt given another 15 minutes to attempt to eat. Upon therapist return, pt agreeable to participate in therapy. Session focused on functional endurance, standing balance, stand pivot transfers, sit<>stands. Pt propelled w/c 150' to rehab gym w/ overall SBA and lots of extra time. In gym pt performed multiple sit<>stands from mat with overall MinA, however pt requires increased assist if he does not have R hand on walker and L hand pushing up from table. Pt requires multiple attempts to come to standing and lots of extra time even with max cueing of hand placement, pt will move hands as soon as he tries to move. Pt able to tolerate standing up to 1' at a time and perform reaching and functional activities with L hand and maintain balance w/ CGA w/ 1 UE support. Pt performed stand pivot mat<>w/c 2x w/ RW and overall Mod A x2 w/ extra assist to steady w/c. Session ended in pt's room, where pt was left seated in w/c w/ girlfriend present and all needs within reach.   Therapy Documentation Precautions:  Precautions Precautions: Fall Precaution Comments: New Rt BKA, with protector Required Braces or Orthoses: Knee Immobilizer - Right (limb guard) Restrictions Weight Bearing Restrictions: Yes RLE Weight Bearing: Non weight  bearing Other Position/Activity Restrictions: new right BKA Pain: Pain Assessment Pain Assessment: 0-10 Pain Score: 0-No pain  See FIM for current functional status  Therapy/Group: Individual Therapy  [name]  [name], PT, DPT 01/06/2014, 7:34 AM

## 2014-01-06 NOTE — Progress Notes (Signed)
Occupational Therapy Session Note  Patient Details  Name: [name] MRN: [id number] Date of Birth: 02-Aug-1975  Today's Date: 01/06/2014 OT Individual Time: 0930-1100 OT Individual Time Calculation (min): 90 min    Short Term Goals: Week 2:  OT Short Term Goal 1 (Week 2): Pt will complete bathing at edge of bed and supine with min assist. OT Short Term Goal 2 (Week 2): Pt will complete transfer from w/c to [facility] with steadying assist OT Short Term Goal 3 (Week 2): Pt will don left sock and shoe using AE independently OT Short Term Goal 4 (Week 2): Pt will demo ability to complete simple meal prep at w/c level with setup assist OT Short Term Goal 5 (Week 2): Pt will complete UE HEP with supervision  Skilled Therapeutic Interventions/Progress Updates: ADL-retraining with focus on pt ed, discharge planning, sit>stand, stand pivot transfer, tub bench transfer, adapted bathing (75 min).   Pt received supine in bed, receptive for bathing in standard tub using tub bench after motivational interview with patient, occupational therapist and physical therapist regarding discharge environment and need for increased effort and energy committed to achieving goals.   Motivated by discussion, pt elected to attempt stand-pivot transfer to w/c from edge of bed and completed task with overall mod assist (Iift and steadying) with second helper repositioning w/c.   Pt was escorted to ADL apartment and completed lateral transfer from w/c to tub bench with extra time and min vc for technique.   Pt performed bathing seated with good thoroughness and returned to w/c.   From w/c, pt was oriented to kitchen task and performed initial prep to meal planned: waffle chicken dinner.    Pt was encouraged to attempt standing at countertop but refused 3 times during session, electing to prepare brine solution for chicken seated in his w/c d/t fearfulness with supported standing.    Pt returned to his room after meal prep and  remained in w/c.    Pt remained in gown during this session after bathing d/t need for orientation to [place] task.     Therapy Documentation Precautions:  Precautions Precautions: Fall Precaution Comments: New Rt BKA, with protector Required Braces or Orthoses: Knee Immobilizer - Right (limb guard) Restrictions Weight Bearing Restrictions: Yes RLE Weight Bearing: Non weight bearing Other Position/Activity Restrictions: new right BKA  Pain: Pain Assessment Pain Score: 6    See FIM for current functional status  Therapy/Group: Individual Therapy  [place] 01/06/2014, 12:41 PM

## 2014-01-06 NOTE — Progress Notes (Signed)
Social Work Patient ID: [name], male   DOB: [birth year], 38 y.o.   MRN: [id number]   Met with pt and girlfriend today to review team conference.  Both aware that d/c date changed to 1/6 due to very slow gains.  Explained that while change was made they should not anticipate another extension.  Explained that team very concerned about limited progress as well as pt's indecisiveness about d/c location and status of ramp.  Girlfriend reports that the plan is for pt to d/c to her home and that she has people in place to build ramp as soon as whether improves and her yard is in better shape.  While she is reporting this, pt keeps his head hanging down and makes no comments himself.  Questioned pt if he had any reservations about plan to d/c to gf's home and he states, "no".  Questioned him about his limited verbal input and engagement overall with any discussions and he states, "I'ts just a lot to take in."  Pt adamantly denies that he is experiencing any depression and notes he just feels "overwhelmed".  I explained that it is our job as a team to provide realistic expectations and my role to confirm safe d/c arrangements.   Also explained to them both that, if a d/c home is not possible, then my only other option to offer is SNF.  Also, given that he is MA pending, he would d/c to a facility out of town.  Both ask "for how long?" and I have explained that it would depend on how long it took to get the home ready for him.  Have made agreement with pt and gf that we have a definite plan by Thursday.  GF needs to check with people assisting her with ramp to confirm if can be completed by 1/6 (or quickly thereafter - could ambulance home).  Weather looks good the next several days.  She does confirm that she can provide the 24/7 physical assistance at home for pt. Will keep team posted.  Nasteho Glantz, LCSW

## 2014-01-06 NOTE — Progress Notes (Signed)
ANTICOAGULATION CONSULT NOTE - Follow Up Consult  Pharmacy Consult for coumadin Indication: hx of DVT/PE  No Known Allergies  Patient Measurements: Weight: (!) 377 lb 3.3 oz (171.1 kg) Heparin Dosing Weight:   Vital Signs: Temp: 99.3 F (37.4 C) (12/29 0536) Temp Source: Oral (12/29 0536) BP: 173/95 mmHg (12/29 0536) Pulse Rate: 107 (12/29 0536)  Labs:  Recent Labs  01/04/14 0455 01/05/14 0515 01/06/14 0500  HGB  --  7.8*  --   HCT  --  24.8*  --   PLT  --  415*  --   LABPROT 31.4* 30.4* 27.6*  INR 3.00* 2.88* 2.55*  CREATININE  --  2.10*  --     Estimated Creatinine Clearance: 80.2 mL/min (by C-G formula based on Cr of 2.1).   Medications:  Scheduled:  . amLODipine  10 mg Oral Daily  . calcium carbonate  1 tablet Oral TID WC  . carvedilol  12.5 mg Oral BID WC  . escitalopram  20 mg Oral QHS  . feeding supplement (GLUCERNA SHAKE)  237 mL Oral BID BM  . feeding supplement (PRO-STAT SUGAR FREE 64)  30 mL Oral TID WC  . furosemide  20 mg Oral Daily  . insulin aspart  0-15 Units Subcutaneous TID WC  . insulin glargine  10 Units Subcutaneous QHS  . pantoprazole  40 mg Oral BID  . pravastatin  20 mg Oral q1800  . senna-docusate  2 tablet Oral BID  . sorbitol  45 mL Oral Once  . tamsulosin  0.4 mg Oral QPC supper  . Warfarin - Pharmacist Dosing Inpatient   Does not apply q1800   Infusions:    Assessment: 38 yo male with hx of DVT/PE is currently on therapeutic coumadin, but INR down to 2.55 from 2.88. Goal of Therapy:  INR 2-3 Monitor platelets by anticoagulation protocol: Yes   Plan:  - Coumadin 1 mg po x1 (may need b/w 0.5-1 mg daily at this time) - Daily INR  Asriel Westrup, [name] 01/06/2014,8:28 AM

## 2014-01-06 NOTE — Consult Note (Signed)
  INITIAL DIAGNOSTIC EVALUATION - [facility]   MEDICAL NECESSITY:  [name] was seen on the [facility] for an initial diagnostic evaluation owing to the patient's diagnosis of amputation of lower extremity below the knee.    According to medical records, [name] was admitted to the rehab unit owing to "Functional deficits secondary to.BKA." Records also indicate that he is a "38 y.o. male with history of DM type 2 with diabetic foot ulcer, morbid obesity, HTN, CKD, on chronic Coumadin for recurrent DVT/PE, medical non-compliance; who was admitted via MD office on 12/02 with fever, chills, N/V and right foot pain. He was started on IV antibiotics for cellulitis right foot as well as IVF for AKI on CKD.Patient continued to have poor wound healing with wet gangrene and failed attempts at limb salvage. He underwent R-BKA.on 12/22/13.  He has had issues with depressed mood and difficulty dealing with amputation.    During today's visit, [name] denied suffering from any major cognitive difficulty. From an emotional standpoint, he admitted that he transiently feels down owing to his present medical situation. However, he seemed somewhat guarded about his feelings. He described himself as "determined" to overcome the situation and get better. He said he has looked into the amputee support group and plans to pursue counseling upon discharge. He has no reported history of mental illness. No adjustment issues endorsed. Suicidal/homicidal ideation, plan or intent was denied. No manic or hypomanic episodes were reported. The patient denied ever experiencing any auditory/visual hallucinations. No major behavioral or personality changes were endorsed.   [name] said that he has been making great progress in therapy and described the rehab staff as "excellent." No barriers to therapy identified. He has his family, friends and girlfriend  to support him throughout this endeavor. He said that upon discharge he will either live with his mother or girlfriend while he recovers. He is okay with this plan.   PROCEDURES ADMINISTERED: [1 unit [id number] on 01/05/14] Diagnostic clinical interview  Review of available records  Behavioral Evaluation: [name] was appropriately dressed for season and situation, and he appeared tidy and well-groomed. Normal posture was noted. He was friendly and rapport was easily established. His speech was as expected and he was able to express ideas effectively. His affect was appropriately modulated. Attention and motivation were good.    IMPRESSION: Overall, [name] denied suffering from any major cognitive difficulty. On the surface he appears to be adjusting well to his present medical situation but I think there is a healthy level of guardedness present which his okay for now. I spent a majority of the time discussing resources available to individuals such as him (i.e., amputee support group; individual psychotherapy) and I encouraged him to take advantage of these services. I plan to follow-up with him for coping throughout this admission.    RECOMMENDATIONS  . Brief counseling for social support and psychoeducation seems warranted during this hospitalization. Please place him on my schedule for next Monday.   . Encourage participation in amputee support group. Please provide resources for individual psychotherapy for post-discharge follow-up.   DIAGNOSES:  BKA  Adjustment disorder with depressed mood (Mild); provisional   Rutha Bouchard, Psy.D.  Clinical Neuropsychologist  Rehabilitation Psychologist

## 2014-01-07 ENCOUNTER — Encounter (HOSPITAL_COMMUNITY): Payer: Self-pay

## 2014-01-07 ENCOUNTER — Inpatient Hospital Stay (HOSPITAL_COMMUNITY): Payer: Medicaid Other | Admitting: Physical Therapy

## 2014-01-07 ENCOUNTER — Inpatient Hospital Stay (HOSPITAL_COMMUNITY): Payer: Self-pay | Admitting: Occupational Therapy

## 2014-01-07 ENCOUNTER — Inpatient Hospital Stay (HOSPITAL_COMMUNITY): Payer: Medicaid Other | Admitting: *Deleted

## 2014-01-07 LAB — PROTIME-INR
INR: 2.37 — ABNORMAL HIGH (ref 0.00–1.49)
PROTHROMBIN TIME: 26.1 s — AB (ref 11.6–15.2)

## 2014-01-07 LAB — GLUCOSE, CAPILLARY
Glucose-Capillary: 127 mg/dL — ABNORMAL HIGH (ref 70–99)
Glucose-Capillary: 140 mg/dL — ABNORMAL HIGH (ref 70–99)
Glucose-Capillary: 121 mg/dL — ABNORMAL HIGH (ref 70–99)
Glucose-Capillary: 113 mg/dL — ABNORMAL HIGH (ref 70–99)

## 2014-01-07 LAB — CBC
HEMATOCRIT: 24.9 % — AB (ref 39.0–52.0)
HEMOGLOBIN: 8.1 g/dL — AB (ref 13.0–17.0)
MCH: 27.5 pg (ref 26.0–34.0)
MCHC: 32.5 g/dL (ref 30.0–36.0)
MCV: 84.4 fL (ref 78.0–100.0)
Platelets: 380 10*3/uL (ref 150–400)
RBC: 2.95 MIL/uL — AB (ref 4.22–5.81)
RDW: 14.2 % (ref 11.5–15.5)
WBC: 8.8 10*3/uL (ref 4.0–10.5)

## 2014-01-07 MED ORDER — INSULIN ASPART PROT & ASPART (70-30 MIX) 100 UNIT/ML ~~LOC~~ SUSP
5.0000 [IU] | Freq: Two times a day (BID) | SUBCUTANEOUS | Status: DC
  Administered 2014-01-08 – 2014-01-13 (×9): 5 [IU] via SUBCUTANEOUS
  Filled 2014-01-07: qty 10

## 2014-01-07 MED ORDER — WARFARIN SODIUM 1 MG PO TABS
1.0000 mg | ORAL_TABLET | Freq: Once | ORAL | Status: AC
  Administered 2014-01-07: 1 mg via ORAL
  Filled 2014-01-07: qty 1

## 2014-01-07 NOTE — Discharge Planning (Signed)
RN changed the right buttocks wound with girlfriend observation. Isoform dressing changed serosanoius drainage on old dressing. Old dressing dated 01/06/2014. Small Align dressing covered on top of the wound.

## 2014-01-07 NOTE — Progress Notes (Signed)
Subjective/Complaints: Fair night. Able to sleep. Pain controlled. Not always engaging with staff.  Objective: Vital Signs: Blood pressure 144/73, pulse 100, temperature 98.6 F (37 C), temperature source Oral, resp. rate 18, weight 171.5 kg (378 lb 1.4 oz), SpO2 93 %. No results found. Results for orders placed or performed during the hospital encounter of 12/26/13 (from the past 72 hour(s))  Glucose, capillary     Status: Abnormal   Collection Time: 01/04/14 11:37 AM  Result Value Ref Range   Glucose-Capillary 108 (H) 70 - 99 mg/dL  Glucose, capillary     Status: Abnormal   Collection Time: 01/04/14  4:46 PM  Result Value Ref Range   Glucose-Capillary 142 (H) 70 - 99 mg/dL  Glucose, capillary     Status: Abnormal   Collection Time: 01/04/14  9:29 PM  Result Value Ref Range   Glucose-Capillary 130 (H) 70 - 99 mg/dL   Comment 1 Notify RN   Protime-INR     Status: Abnormal   Collection Time: 01/05/14  5:15 AM  Result Value Ref Range   Prothrombin Time 30.4 (H) 11.6 - 15.2 seconds   INR 2.88 (H) 0.00 - 1.49  CBC     Status: Abnormal   Collection Time: 01/05/14  5:15 AM  Result Value Ref Range   WBC 9.3 4.0 - 10.5 K/uL   RBC 2.95 (L) 4.22 - 5.81 MIL/uL   Hemoglobin 7.8 (L) 13.0 - 17.0 g/dL   HCT 24.8 (L) 39.0 - 52.0 %   MCV 84.1 78.0 - 100.0 fL   MCH 26.4 26.0 - 34.0 pg   MCHC 31.5 30.0 - 36.0 g/dL   RDW 14.4 11.5 - 15.5 %   Platelets 415 (H) 150 - 400 K/uL  Basic metabolic panel     Status: Abnormal   Collection Time: 01/05/14  5:15 AM  Result Value Ref Range   Sodium 136 135 - 145 mmol/L    Comment: Please note change in reference range.   Potassium 4.1 3.5 - 5.1 mmol/L    Comment: Please note change in reference range.   Chloride 107 96 - 112 mEq/L   CO2 23 19 - 32 mmol/L   Glucose, Bld 113 (H) 70 - 99 mg/dL   BUN 19 6 - 23 mg/dL   Creatinine, Ser 2.10 (H) 0.50 - 1.35 mg/dL   Calcium 8.0 (L) 8.4 - 10.5 mg/dL   GFR calc non Af Amer 39 (L) >90 mL/min   GFR calc  Af Amer 45 (L) >90 mL/min    Comment: (NOTE) The eGFR has been calculated using the CKD EPI equation. This calculation has not been validated in all clinical situations. eGFR's persistently <90 mL/min signify possible Chronic Kidney Disease.    Anion gap 6 5 - 15  Glucose, capillary     Status: Abnormal   Collection Time: 01/05/14  6:46 AM  Result Value Ref Range   Glucose-Capillary 110 (H) 70 - 99 mg/dL   Comment 1 Notify RN   Glucose, capillary     Status: Abnormal   Collection Time: 01/05/14 12:16 PM  Result Value Ref Range   Glucose-Capillary 130 (H) 70 - 99 mg/dL  Glucose, capillary     Status: Abnormal   Collection Time: 01/05/14  5:08 PM  Result Value Ref Range   Glucose-Capillary 131 (H) 70 - 99 mg/dL  Glucose, capillary     Status: Abnormal   Collection Time: 01/05/14  9:02 PM  Result Value Ref Range   Glucose-Capillary  120 (H) 70 - 99 mg/dL  Protime-INR     Status: Abnormal   Collection Time: 01/06/14  5:00 AM  Result Value Ref Range   Prothrombin Time 27.6 (H) 11.6 - 15.2 seconds   INR 2.55 (H) 0.00 - 1.49  Glucose, capillary     Status: Abnormal   Collection Time: 01/06/14  6:43 AM  Result Value Ref Range   Glucose-Capillary 100 (H) 70 - 99 mg/dL  Glucose, capillary     Status: Abnormal   Collection Time: 01/06/14 11:18 AM  Result Value Ref Range   Glucose-Capillary 105 (H) 70 - 99 mg/dL  Glucose, capillary     Status: Abnormal   Collection Time: 01/06/14  4:40 PM  Result Value Ref Range   Glucose-Capillary 111 (H) 70 - 99 mg/dL  Glucose, capillary     Status: Abnormal   Collection Time: 01/06/14  9:01 PM  Result Value Ref Range   Glucose-Capillary 112 (H) 70 - 99 mg/dL  Protime-INR     Status: Abnormal   Collection Time: 01/07/14  5:45 AM  Result Value Ref Range   Prothrombin Time 26.1 (H) 11.6 - 15.2 seconds   INR 2.37 (H) 0.00 - 1.49  Glucose, capillary     Status: Abnormal   Collection Time: 01/07/14  7:03 AM  Result Value Ref Range    Glucose-Capillary 127 (H) 70 - 99 mg/dL     HEENT: normal Cardio: RRR and no murmurs Resp: CTA B/L and unlabored GI: BS positive and nontender nondistended Extremity:  Edema left stump Skin:   Wound clean with minimal drainage  Neuro: Alert/Oriented and Abnormal Motor decreased left knee flexion extension inhibited by pain Musc/Skel:  Right leg appropriately tender. Gen NAD   Assessment/Plan: 1. Functional deficits secondary to Right BKA which require 3+ hours per day of interdisciplinary therapy in a comprehensive inpatient rehab setting. Physiatrist is providing close team supervision and 24 hour management of active medical problems listed below. Physiatrist and rehab team continue to assess barriers to discharge/monitor patient progress toward functional and medical goals. FIM: FIM - Bathing Bathing Steps Patient Completed: Chest, Right Arm, Left Arm, Abdomen, Front perineal area, Buttocks, Right upper leg, Left upper leg Bathing: 4: Min-Patient completes 8-9 [id number] parts or 75+ percent  FIM - Upper Body Dressing/Undressing Upper body dressing/undressing steps patient completed: Thread/unthread right sleeve of pullover shirt/dresss, Thread/unthread left sleeve of pullover shirt/dress, Put head through opening of pull over shirt/dress, Pull shirt over trunk Upper body dressing/undressing: 0: Wears gown/pajamas-no public clothing FIM - Lower Body Dressing/Undressing Lower body dressing/undressing steps patient completed: Thread/unthread right underwear leg, Thread/unthread left underwear leg, Thread/unthread right pants leg, Thread/unthread left pants leg Lower body dressing/undressing: 0: Wears gown/pajamas-no public clothing  FIM - Toileting Toileting steps completed by patient: Adjust clothing prior to toileting Toileting Assistive Devices: Grab bar or rail for support Toileting: 1: Total-Patient completed zero steps, helper did all 3  FIM - TEngineer, structuralDevices: BRecruitment consultantTransfers: 4-To toilet/BSC: Min A (steadying Pt. > 75%), 4-From toilet/BSC: Min A (steadying Pt. > 75%)  FIM - Bed/Chair Transfer Bed/Chair Transfer Assistive Devices: Arm rests Bed/Chair Transfer: 4: Bed > Chair or W/C: Min A (steadying Pt. > 75%), 4: Chair or W/C > Bed: Min A (steadying Pt. > 75%)  FIM - Locomotion: Wheelchair Distance: 150 Locomotion: Wheelchair: 5: Travels 150 ft or more: maneuvers on rugs and over door sills with supervision, cueing or coaxing FIM - Locomotion: Ambulation Locomotion:  Ambulation Assistive Devices: Administrator Ambulation/Gait Assistance: 1: +2 Total assist (+2 for close w/c follow) Locomotion: Ambulation: 0: Activity did not occur  Comprehension Comprehension Mode: Auditory Comprehension: 5-Understands complex 90% of the time/Cues < 10% of the time  Expression Expression Mode: Verbal Expression: 5-Expresses basic needs/ideas: With no assist  Social Interaction Social Interaction: 5-Interacts appropriately 90% of the time - Needs monitoring or encouragement for participation or interaction.  Problem Solving Problem Solving: 5-Solves complex 90% of the time/cues < 10% of the time  Memory Memory: 5-Recognizes or recalls 90% of the time/requires cueing < 10% of the time  Medical Problem List and Plan: 1. Functional deficits secondary to R-BKA, also severely deconditioned 2. DVT Prophylaxis/Anticoagulation: Pharmaceutical: Lovenox 3. Pain Management: Premedicate prior to therapy sessions. Continues to require IV dilaudid in addition to hydrocodone --will escalate to oxycodone prn and d/c dilaudid.  4. Mood: team to provide ego support. Added lexapro for mood stabilization. LCSW to follow for evaluation and support.  .  5. Neuropsych: This patient is capable of making decisions on his own behalf. 6. Skin/Wound Care: Daily dressing changes to left buttock wound. Routine pressure relief measures.  Monitor wound daily for healing. Maintain adequate nutrition. .  7. Fluids/Electrolytes/Nutrition: Monitor I/O. Added protein supplements to help with low protein stores.  8. Reactive leucocytosis: Resolving . Monitor wound and temperature curve daily. Low grade temp still  9. DM type 2:  stop lantus---begin low dose 70/30 insulin 5u bid  -encouarged more consistent po intake  -fair/good control at present 10. CKD: Monitor renal status closely as on IV Vancomycin. Avoid other nephrotoxic drugs.   11. HTN: Monitor BP tid on norvasc. Off prinizide and lasix due to AKI on CKD. Baseline Cr- 1.78 but is stabilizing to 2.3 range.   -increased coreg to 12.70m bid 12. Acute on chronic anemia: Low iron stores with high ferritin levels. Likely poor absorption. Transfused with 2 units on 12/17.  13. Left buttock wound: S/p I & D 12/14--antibiotics complete  14. Resting tachycardia: HR in 110 range. Multifactorial due to obesity  anemia as well as deconditioning. Will monitor for symptoms with activity.  Bb increased  15.  Atelectasis encurage IS 16. Nausea---likely multifactorial due to abx, constipation  -some improvement LOS (Days) 12 A FACE TO FACE EVALUATION WAS PERFORMED  Keirstyn Aydt T 01/07/2014, 8:05 AM

## 2014-01-07 NOTE — Progress Notes (Signed)
Occupational Therapy Session Note  Patient Details  Name: [name] MRN: [id number] Date of Birth: 1975/02/16  Today's Date: 01/07/2014 OT Individual Time: 0900-1030 OT Individual Time Calculation (min): 90 min    Short Term Goals: Week 2:  OT Short Term Goal 1 (Week 2): Pt will complete bathing at edge of bed and supine with min assist. OT Short Term Goal 2 (Week 2): Pt will complete transfer from w/c to [facility] with steadying assist OT Short Term Goal 3 (Week 2): Pt will don left sock and shoe using AE independently OT Short Term Goal 4 (Week 2): Pt will demo ability to complete simple meal prep at w/c level with setup assist OT Short Term Goal 5 (Week 2): Pt will complete UE HEP with supervision  Skilled Therapeutic Interventions/Progress Updates:    Pt resting in bed upon arrival and agreeable to bathing at shower level and dressing with sit<>stand from w/c at sink.  Pt performed squat pivot transfer to w/c with min A and from w/c<>tub bench.  Pt performed sit<>stand from w/c with min A to facilitate therapist pulling up pants.  Pt requires extra time to initiate tasks and complete tasks.  Pt also requires multiple rest breaks throughout session.  Pt directs care appropriately throughout session.  Focus on activity tolerance, bed mobility, functional transfers, sit<>stand, and safety awareness.  Therapy Documentation Precautions:  Precautions Precautions: Fall Precaution Comments: New Rt BKA, with protector Required Braces or Orthoses: Knee Immobilizer - Right (limb guard) Restrictions Weight Bearing Restrictions: Yes RLE Weight Bearing: Non weight bearing Other Position/Activity Restrictions: new right BKA  Pain: Pain Assessment Pain Assessment: No/denies pain    See FIM for current functional status  Therapy/Group: Individual Therapy  [name] 01/07/2014, 10:47 AM

## 2014-01-07 NOTE — Progress Notes (Signed)
3Physical Therapy Session Note  Patient Details  Name: [name] MRN: [id number] Date of Birth: 21-Oct-1975  Today's Date: 01/07/2014 PT Individual Time: 1400-1530 PT Individual Time Calculation (min): 90 min   Short Term Goals: Week 2:  PT Short Term Goal 1 (Week 2): = LTG which have been downgraded due to lack of progress  Skilled Therapeutic Interventions/Progress Updates:  Pt received up in w/c in gym, ready for PT session.     Therapeutic Activity: PT instructs pt in sit to stand with bari RW req min A for balance and CGA to pivot w/c to mat.  Pt demonstrates sit to supine transfer on mat mod I, L side lie to prone transfer mod I.  PT instructs pt in prone to/from quadruped transfer req SBA and crawling laterally and forward/backward on mat, req SBA.  Pt demonstrates mod I R side lie to sit transfer.  PT instructs pt in mat to w/c transfer via stand-pivot with bari RW req min A for balance, blocking pt's L knee safely.   Therapeutic Exercise: PT instructs pt in ROM and strengthening exercises: prone knee flexion 2 x 10 reps open chain ROM, progressed to a 3rd set with orange theraband resistance given by PT x 10 reps each LE.  PT instructs pt in prone press-ups/prone to partial quadruped 3 x 10 reps - with rest breaks prn.  PT instructs pt in prone hip extension A/AROM prn 3 x 10 reps each.  PT instructs pt in side lie hip abduction 3 x 10 reps each LE.  PT instructs pt in supine SLR 3 x 10 reps each LE.   Neuromuscular Reeducation: PT instructs pt in quadruped balance exercise: unilateral arm flexion x 10 reps each UE req CGA for safety.  PT instructs pt in quadruped balance exercise: unilateral leg extension x 10 reps each LE req CGA for safety.   W/C Management: PT instructs pt in w/c propulsion with B UEs x 150' req SBA and significantly increased time. Pt demonstrates ability to don/doff legrests with set up assist.   Pt is continuing to progress with physical  therapy, but requires numerous rest breaks during session due to deconditioning. Pt has progressed to grossly a 1 person assist for stand-pivot/hop transfers. Girlfriend [name] present throughout session and encouraging to patient. Continue per PT POC.   Therapy Documentation Precautions:  Precautions Precautions: Fall Precaution Comments: New Rt BKA, with protector Required Braces or Orthoses: Knee Immobilizer - Right (limb guard) Restrictions Weight Bearing Restrictions: Yes RLE Weight Bearing: Non weight bearing Other Position/Activity Restrictions: new right BKA   Pain: Pain Assessment Pain Assessment: 0-10 Pain Score: 6  Pain Type: Surgical pain Pain Location: Leg Pain Orientation: Right Pain Descriptors / Indicators: Aching;Burning Pain Onset: On-going Pain Intervention(s): Rest Multiple Pain Sites: Yes 2nd Pain Site Pain Score: 8 Pain Type: Phantom pain Pain Location: Ankle Pain Orientation: Right Pain Descriptors / Indicators: Throbbing Pain Onset: With Activity Pain Intervention(s): Rest   See FIM for current functional status  Therapy/Group: Individual Therapy  [name] M 01/07/2014, 2:17 PM

## 2014-01-07 NOTE — Progress Notes (Signed)
Recreational Therapy Session Note  Patient Details  Name: [name] MRN: [id number] Date of Birth: [birth year] Today's Date: 01/07/2014  Pain: no c/o Skilled Therapeutic Interventions/Progress Updates: Session focused on pt education in regards to relaxation techniques.  LRT gave pt handouts on simple ways to relieve tension & diaphragmatic breathing.  Reviewed all information, demonstrated & had pt return demonstration on diaphragmatic breathing techniques. Pt appreciated of information.  Therapy/Group: Individual Therapy Lakeeta Dobosz 01/07/2014, 12:11 PM

## 2014-01-07 NOTE — Progress Notes (Signed)
ANTICOAGULATION CONSULT NOTE - Follow Up Consult  Pharmacy Consult for coumadin Indication: hx DVT/PE  No Known Allergies  Patient Measurements: Weight: (!) 378 lb 1.4 oz (171.5 kg) Heparin Dosing Weight:   Vital Signs: Temp: 98.6 F (37 C) (12/30 0505) Temp Source: Oral (12/30 0505) BP: 144/73 mmHg (12/30 0505) Pulse Rate: 100 (12/30 0505)  Labs:  Recent Labs  01/05/14 0515 01/06/14 0500 01/07/14 0545  HGB 7.8*  --   --   HCT 24.8*  --   --   PLT 415*  --   --   LABPROT 30.4* 27.6* 26.1*  INR 2.88* 2.55* 2.37*  CREATININE 2.10*  --   --     Estimated Creatinine Clearance: 80.3 mL/min (by C-G formula based on Cr of 2.1).   Medications:  Scheduled:  . amLODipine  10 mg Oral Daily  . calcium carbonate  1 tablet Oral TID WC  . carvedilol  12.5 mg Oral BID WC  . escitalopram  20 mg Oral QHS  . feeding supplement (GLUCERNA SHAKE)  237 mL Oral BID BM  . feeding supplement (PRO-STAT SUGAR FREE 64)  30 mL Oral TID WC  . furosemide  20 mg Oral Daily  . insulin aspart  0-15 Units Subcutaneous TID WC  . insulin aspart protamine- aspart  5 Units Subcutaneous BID WC  . pantoprazole  40 mg Oral BID  . pravastatin  20 mg Oral q1800  . senna-docusate  2 tablet Oral BID  . sorbitol  45 mL Oral Once  . tamsulosin  0.4 mg Oral QPC supper  . Warfarin - Pharmacist Dosing Inpatient   Does not apply q1800   Infusions:    Assessment: 38 yo male with hx of VTE is currently on therapeutic coumadin.  INR is down to 2.37 from 2.55  Goal of Therapy:  INR 2-3 Monitor platelets by anticoagulation protocol: Yes   Plan:  - coumadin 1mg  po x1 - INR in am  Kyerra Vargo, [name] 01/07/2014,8:25 AM

## 2014-01-07 NOTE — Progress Notes (Signed)
Occupational Therapy Session Note  Patient Details  Name: [name] MRN: [id number] Date of Birth: 09-11-1975  Today's Date: 01/07/2014 OT Individual Time: 1300-1400 OT Individual Time Calculation (min): 60 min    Short Term Goals: Week 2:  OT Short Term Goal 1 (Week 2): Pt will complete bathing at edge of bed and supine with min assist. OT Short Term Goal 2 (Week 2): Pt will complete transfer from w/c to [facility] with steadying assist OT Short Term Goal 3 (Week 2): Pt will don left sock and shoe using AE independently OT Short Term Goal 4 (Week 2): Pt will demo ability to complete simple meal prep at w/c level with setup assist OT Short Term Goal 5 (Week 2): Pt will complete UE HEP with supervision  Skilled Therapeutic Interventions/Progress Updates:    Pt performed sit to stand and standing trials at the mirror during session.  Pt initially able to stand for 25 seconds with max assist on first interval.  He progressed to 1 minute on the second interval, with both UEs stabilized on the walker.  Still needs mod instructional cueing for sit to stand as he attempts to pull up from the chair with the walker with one hand instead of pushing from the chair arms with both.  Progressed to standing and releasing the LUE in order to play tic tac toe game.  Mod assist for standing balance while performing task with pt able to stand for 40 seconds each time.  He was also able to release the RUE on one occasion to play game but needed total assist +2 (pt 40%) for balance.  Finished session with hopping forward 3 small steps with max assist and then backwards 2 steps.  Pt fatigued and sat down early instead of stepping all the way back to the chair and reaching back for the chair arms.    Therapy Documentation Precautions:  Precautions Precautions: Fall Precaution Comments: New Rt BKA, with protector Required Braces or Orthoses: Knee Immobilizer - Right Restrictions Weight Bearing Restrictions: No RLE  Weight Bearing: Non weight bearing Other Position/Activity Restrictions: new right BKA  Pain: Pain Assessment Pain Assessment: 0-10 Pain Score: 6  Pain Type: Surgical pain Pain Location: Leg Pain Orientation: Right Pain Descriptors / Indicators: Aching;Burning Pain Onset: On-going Pain Intervention(s): Repositioned;Medication (See eMAR) Multiple Pain Sites: Yes 2nd Pain Site Pain Score: 8 Pain Type: Phantom pain Pain Location: Ankle Pain Orientation: Right Pain Descriptors / Indicators: Throbbing Pain Onset: With Activity Pain Intervention(s): Rest ADL: See FIM for current functional status  Therapy/Group: Individual Therapy  Othar Curto OTR/L 01/07/2014, 3:53 PM

## 2014-01-08 ENCOUNTER — Inpatient Hospital Stay (HOSPITAL_COMMUNITY): Payer: Medicaid Other | Admitting: Physical Therapy

## 2014-01-08 ENCOUNTER — Inpatient Hospital Stay (HOSPITAL_COMMUNITY): Payer: Medicaid Other

## 2014-01-08 ENCOUNTER — Encounter (HOSPITAL_COMMUNITY): Payer: Self-pay | Admitting: *Deleted

## 2014-01-08 ENCOUNTER — Inpatient Hospital Stay (HOSPITAL_COMMUNITY): Payer: Self-pay | Admitting: Occupational Therapy

## 2014-01-08 ENCOUNTER — Inpatient Hospital Stay (HOSPITAL_COMMUNITY): Payer: Self-pay | Admitting: Physical Therapy

## 2014-01-08 LAB — PROTIME-INR
INR: 2.89 — ABNORMAL HIGH (ref 0.00–1.49)
PROTHROMBIN TIME: 30.5 s — AB (ref 11.6–15.2)

## 2014-01-08 LAB — GLUCOSE, CAPILLARY
GLUCOSE-CAPILLARY: 110 mg/dL — AB (ref 70–99)
GLUCOSE-CAPILLARY: 116 mg/dL — AB (ref 70–99)
GLUCOSE-CAPILLARY: 92 mg/dL (ref 70–99)
Glucose-Capillary: 113 mg/dL — ABNORMAL HIGH (ref 70–99)

## 2014-01-08 MED ORDER — WARFARIN 0.5 MG HALF TABLET
0.5000 mg | ORAL_TABLET | Freq: Once | ORAL | Status: AC
  Administered 2014-01-08: 0.5 mg via ORAL
  Filled 2014-01-08: qty 1

## 2014-01-08 NOTE — Progress Notes (Signed)
Physical Therapy Session Note  Patient Details  Name: [name] MRN: [id number] Date of Birth: May 12, 1975  Today's Date: 01/08/2014 PT Individual Time: 1100-1200 Treatment Session 2: 1410-1500 PT Individual Time Calculation (min): 60 min  Treatment Session 2: 50 min  Short Term Goals: Week 2:  PT Short Term Goal 1 (Week 2): = LTG which have been downgraded due to lack of progress  Skilled Therapeutic Interventions/Progress Updates:    Therapeutic Activity: Pt demonstrates mod I supine to/from sit in bed.  PT instructs pt in sit to stand from bed with RW req CGA x 1 rep and stabilizing bari RW assist x 2nd rep.  PT instructs pt in stand-pivot transfer bed to w/c req CGA-SBA x 2 reps.  PT instructs pt in stand-pivot transfer 24x18 w/c to mat to 22x18 w/c all req CGA stabilizing the bari RW only.   W/C Management: PT instructs pt in w/c propulsion from room to gym x 150' req SBA and encouragement for this distance and increased time. PT exchanges pt's 24x18 manual w/c for a 22x18 manual w/c and exchanges pt's standard foam cushion for a [employer] cushion for improved pressure relief due to pt's bottom wound.  Pt reports it propels much easier and demonstrates improved speed of w/c propulsion x 150' with SBA and verbal cues for turns.   Therapeutic Exercise: Pt suggests we try single leg squats, and so PT instructs pt in single leg squats x 10 reps req CGA-SBA for safety with RW.   Treatment Session 2: Pt received on the floor with pillows under his head - per Rec Therapy and Occupational Therapy report had slid forward out of w/c to the ground. OT reports pt's w/c brakes were locked and he was trying to scoot forward to reach some gloves, but the (22x18) w/c began to tip forward and pt slid to the ground. Pt was wearing limbguard on R residual limb and denies pain or injury "only my pride" in response to the question "is anything hurt". Once the Maxi-Lift was used to help pt back  into original w/c (24x18) with foam cushion, pt reports he needs to have a BM.   W/C Management: PT instructs pt in w/c propulsion with B UEs x 250' req min A to get back to room quickly in order to transfer onto [facility].   Therapeutic Activity: PT instructs pt in stand-pivot transfer w/c to/from bari [facility] req CGA for safety. Pt req tot A for doffing pants in stand, but SBA donning pants in sit rocking side to side. Pt req set up assist for posterior hygiene - rocking side to side to wipe bottom and again wiping bottom after transfer back to bed and lying in semi-prone.   Neuromuscular Reeducation: PT instructs pt in dynamic sitting balance activity: eating late lunch due to kitchen activity with OT making chicken & waffles was interrupted by pt sliding to the ground. Pt is able to complete this activity mod I sitting on edge of bed.   Pt demonstrates ability to stand-pivot transfer 2 times after slide to the floor and repeatedly denies injury/increased pain. Pt requests chicken and waffles be put up in the fridge so he can work on them tomorrow and rec therapist did this for pt. Continue per PT POC.     Therapy Documentation Precautions:  Precautions Precautions: Fall Precaution Comments: New Rt BKA, with protector Required Braces or Orthoses: Knee Immobilizer - Right Restrictions Weight Bearing Restrictions: Yes RLE Weight Bearing: Non weight bearing Other  Position/Activity Restrictions: new right BKA Pain: Pain Assessment Pain Assessment: 0-10 Pain Score: 6  Pain Type: Surgical pain Pain Location: Leg Pain Orientation: Right;Distal Pain Descriptors / Indicators: Burning;Throbbing Pain Onset: On-going Pain Intervention(s): Rest;Repositioned Multiple Pain Sites: Yes 2nd Pain Site Pain Score: 6 Pain Type: Phantom pain Pain Location: Ankle Pain Orientation: Right Pain Descriptors / Indicators: Aching (feels like a sprain) Pain Onset: On-going Pain Intervention(s): Rest;Emotional  support  Treatment Session 2: Pt c/o 4/10 pain in surgical site of R leg and PT instructs pt to rest to reduce his pain.    See FIM for current functional status  Therapy/Group: Individual Therapy  [name] 01/08/2014, 11:13 AM

## 2014-01-08 NOTE — Progress Notes (Signed)
Occupational Therapy Session Note  Patient Details  Name: [name] MRN: [id number] Date of Birth: 1975-02-22  Today's Date: 01/08/2014 OT Individual Time: 1300-1410 OT Individual Time Calculation (min): 70 min    Skilled Therapeutic Interventions/Progress Updates:    Pt used his wheelchair to roll around the kitchen and begin simple meal prep of making chicken and waffles.  Pt needed mod instructional cueing to remember to lock the wheelchair brakes prior to reaching for items in the refrigerator and in the cabinets.  Attempted to stand from the wheelchair at the countertop but pt unable to complete and the brakes on the current wheelchair he was in were not holding adequately.  Pt attempted to scoot his wheelchair forward on one occasion with the brakes locked and the wheelchair tipped forward resulting in the pt sliding out onto the floor.  Pt with no injuries and used the maxi lift to place in another wheelchair.  Pt's nurse [name] and PA [name] both notified of the fall.  Pt continued session with PT as OT session was concluded.    Therapy Documentation Precautions:  Precautions Precautions: Fall Precaution Comments: New Rt BKA, with protector Required Braces or Orthoses: Knee Immobilizer - Right Restrictions Weight Bearing Restrictions: Yes RLE Weight Bearing: Non weight bearing Other Position/Activity Restrictions: new right BKA  Pain: Pain Assessment Pain Assessment: 0-10 Pain Score: 4  Faces Pain Scale: Hurts a little bit Pain Type: Surgical pain Pain Location: Leg Pain Orientation: Right Pain Descriptors / Indicators: Aching Pain Onset: On-going Pain Intervention(s): Medication (See eMAR) Multiple Pain Sites: No ADL: See FIM for current functional status  Therapy/Group: Individual Therapy  Krissy Orebaugh OTR/L 01/08/2014, 4:33 PM

## 2014-01-08 NOTE — Progress Notes (Signed)
   01/08/14 1500  What Happened  Was fall witnessed? Yes (happened at 1400 with therapy- assessed pt now)  Who witnessed fall? Therapy- OT Jeneen Rinks  Patients activity before fall during therapy  Point of contact buttocks (slid from chair-wheelchair tilted forward)  Was patient injured? No  Follow Up  MD notified Algis Liming  Time MD notified [phone]  Family notified Yes-comment  Time family notified 47 (with pt at time of fall)  Additional tests No  Adult Fall Risk Assessment  Risk Factor Category (scoring not indicated) Fall has occurred during this admission (document High fall risk) (high fall risk due to fall)  Patient's Fall Risk High Fall Risk (>13 points)  Adult Fall Risk Interventions  Required Bundle Interventions *See Row Information* High fall risk - low, moderate, and high requirements implemented  Additional Interventions Fall risk signage;Individualized elimination schedule;Use of appropriate toileting equipment (bedpan, BSC, etc.)  Pain Assessment  Pain Score 4  Pain Type Surgical pain  Pain Location Leg  Pain Orientation Right  Pain Intervention(s) Rest  Neurological  Neuro (WDL) X  Level of Consciousness Alert  Orientation Level Oriented X4  Cognition Appropriate at baseline  Speech Delayed responses  RLE Sensation Pain;Tingling  Neuro Symptoms Depression  Musculoskeletal  Musculoskeletal (WDL) X  Assistive Device Wheelchair  Generalized Weakness Yes  Weight Bearing Restrictions Yes  RLE Weight Bearing NWB  Musculoskeletal Details  RLE BKA  Integumentary  Integumentary (WDL) X  Skin Color Appropriate for ethnicity  Skin Condition Dry  Skin Integrity Amputation;Skin tear  Amputation Location Leg  Amputation Location Orientation Right  Skin Tear Location Thigh  Skin Tear Location Orientation Right  Skin Tear Intervention Foam

## 2014-01-08 NOTE — Progress Notes (Signed)
Subjective/Complaints: No new problems reported. Pain controlled.   Objective: Vital Signs: Blood pressure 149/79, pulse 92, temperature 98.4 F (36.9 C), temperature source Oral, resp. rate 18, weight 171.5 kg (378 lb 1.4 oz), SpO2 94 %. No results found. Results for orders placed or performed during the hospital encounter of 12/26/13 (from the past 72 hour(s))  Glucose, capillary     Status: Abnormal   Collection Time: 01/05/14 12:16 PM  Result Value Ref Range   Glucose-Capillary 130 (H) 70 - 99 mg/dL  Glucose, capillary     Status: Abnormal   Collection Time: 01/05/14  5:08 PM  Result Value Ref Range   Glucose-Capillary 131 (H) 70 - 99 mg/dL  Glucose, capillary     Status: Abnormal   Collection Time: 01/05/14  9:02 PM  Result Value Ref Range   Glucose-Capillary 120 (H) 70 - 99 mg/dL  Protime-INR     Status: Abnormal   Collection Time: 01/06/14  5:00 AM  Result Value Ref Range   Prothrombin Time 27.6 (H) 11.6 - 15.2 seconds   INR 2.55 (H) 0.00 - 1.49  Glucose, capillary     Status: Abnormal   Collection Time: 01/06/14  6:43 AM  Result Value Ref Range   Glucose-Capillary 100 (H) 70 - 99 mg/dL  Glucose, capillary     Status: Abnormal   Collection Time: 01/06/14 11:18 AM  Result Value Ref Range   Glucose-Capillary 105 (H) 70 - 99 mg/dL  Glucose, capillary     Status: Abnormal   Collection Time: 01/06/14  4:40 PM  Result Value Ref Range   Glucose-Capillary 111 (H) 70 - 99 mg/dL  Glucose, capillary     Status: Abnormal   Collection Time: 01/06/14  9:01 PM  Result Value Ref Range   Glucose-Capillary 112 (H) 70 - 99 mg/dL  Protime-INR     Status: Abnormal   Collection Time: 01/07/14  5:45 AM  Result Value Ref Range   Prothrombin Time 26.1 (H) 11.6 - 15.2 seconds   INR 2.37 (H) 0.00 - 1.49  Glucose, capillary     Status: Abnormal   Collection Time: 01/07/14  7:03 AM  Result Value Ref Range   Glucose-Capillary 127 (H) 70 - 99 mg/dL  CBC     Status: Abnormal   Collection Time: 01/07/14 11:25 AM  Result Value Ref Range   WBC 8.8 4.0 - 10.5 K/uL   RBC 2.95 (L) 4.22 - 5.81 MIL/uL   Hemoglobin 8.1 (L) 13.0 - 17.0 g/dL   HCT 24.9 (L) 39.0 - 52.0 %   MCV 84.4 78.0 - 100.0 fL   MCH 27.5 26.0 - 34.0 pg   MCHC 32.5 30.0 - 36.0 g/dL   RDW 14.2 11.5 - 15.5 %   Platelets 380 150 - 400 K/uL  Glucose, capillary     Status: Abnormal   Collection Time: 01/07/14 11:42 AM  Result Value Ref Range   Glucose-Capillary 140 (H) 70 - 99 mg/dL  Glucose, capillary     Status: Abnormal   Collection Time: 01/07/14  4:56 PM  Result Value Ref Range   Glucose-Capillary 121 (H) 70 - 99 mg/dL  Glucose, capillary     Status: Abnormal   Collection Time: 01/07/14  9:17 PM  Result Value Ref Range   Glucose-Capillary 113 (H) 70 - 99 mg/dL   Comment 1 Notify RN   Protime-INR     Status: Abnormal   Collection Time: 01/08/14  5:39 AM  Result Value Ref Range  Prothrombin Time 30.5 (H) 11.6 - 15.2 seconds   INR 2.89 (H) 0.00 - 1.49  Glucose, capillary     Status: Abnormal   Collection Time: 01/08/14  6:50 AM  Result Value Ref Range   Glucose-Capillary 113 (H) 70 - 99 mg/dL   Comment 1 Notify RN      HEENT: normal Cardio: RRR and no murmurs Resp: CTA B/L and unlabored GI: BS positive and nontender nondistended Extremity:  Edema left stump Skin:   Wound clean with mild drainage.   Neuro: Alert/Oriented and Abnormal Motor decreased left knee flexion extension inhibited by pain Musc/Skel:  Right leg appropriately tender. Gen NAD   Assessment/Plan: 1. Functional deficits secondary to Right BKA which require 3+ hours per day of interdisciplinary therapy in a comprehensive inpatient rehab setting. Physiatrist is providing close team supervision and 24 hour management of active medical problems listed below. Physiatrist and rehab team continue to assess barriers to discharge/monitor patient progress toward functional and medical goals. FIM: FIM - Bathing Bathing Steps  Patient Completed: Chest, Right Arm, Left Arm, Abdomen, Front perineal area, Buttocks, Right upper leg, Left upper leg Bathing: 4: Min-Patient completes 8-9 14f 10 parts or 75+ percent  FIM - Upper Body Dressing/Undressing Upper body dressing/undressing steps patient completed: Thread/unthread right sleeve of pullover shirt/dresss, Thread/unthread left sleeve of pullover shirt/dress, Put head through opening of pull over shirt/dress, Pull shirt over trunk Upper body dressing/undressing: 5: Supervision: Safety issues/verbal cues FIM - Lower Body Dressing/Undressing Lower body dressing/undressing steps patient completed: Thread/unthread right underwear leg, Thread/unthread left underwear leg, Thread/unthread right pants leg, Thread/unthread left pants leg Lower body dressing/undressing: 4: Min-Patient completed 75 plus % of tasks  FIM - Toileting Toileting steps completed by patient: Adjust clothing prior to toileting, Performs perineal hygiene Toileting Assistive Devices: Grab bar or rail for support Toileting: 3: Mod-Patient completed 2 of 3 steps (Assist patient with clothing)  FIM - Radio producer Devices: Bedside commode Toilet Transfers: 4-To toilet/BSC: Min A (steadying Pt. > 75%), 4-From toilet/BSC: Min A (steadying Pt. > 75%)  FIM - Bed/Chair Transfer Bed/Chair Transfer Assistive Devices: Walker, Arm rests Bed/Chair Transfer: 6: Supine > Sit: No assist, 6: Sit > Supine: No assist, 4: Bed > Chair or W/C: Min A (steadying Pt. > 75%), 4: Chair or W/C > Bed: Min A (steadying Pt. > 75%)  FIM - Locomotion: Wheelchair Distance: 150 Locomotion: Wheelchair: 5: Travels 150 ft or more: maneuvers on rugs and over door sills with supervision, cueing or coaxing FIM - Locomotion: Ambulation Locomotion: Ambulation Assistive Devices: Administrator Ambulation/Gait Assistance: 1: +2 Total assist (+2 for close w/c follow) Locomotion: Ambulation: 0: Activity did not  occur  Comprehension Comprehension Mode: Auditory Comprehension: 5-Understands complex 90% of the time/Cues < 10% of the time  Expression Expression Mode: Verbal Expression: 5-Expresses basic needs/ideas: With no assist  Social Interaction Social Interaction: 5-Interacts appropriately 90% of the time - Needs monitoring or encouragement for participation or interaction.  Problem Solving Problem Solving: 5-Solves complex 90% of the time/cues < 10% of the time  Memory Memory: 5-Recognizes or recalls 90% of the time/requires cueing < 10% of the time  Medical Problem List and Plan: 1. Functional deficits secondary to R-BKA, also severely deconditioned 2. DVT Prophylaxis/Anticoagulation: Pharmaceutical: Lovenox 3. Pain Management: Premedicate prior to therapy sessions. --  oxycodone prn    4. Mood: team to provide ego support. Added lexapro for mood stabilization. LCSW to follow for evaluation and support.  Marland Kitchen  5. Neuropsych: This patient is capable of making decisions on his own behalf. 6. Skin/Wound Care: Daily dressing changes to left buttock wound. Routine pressure relief measures. Monitor wound daily for healing. Maintain adequate nutrition. .  7. Fluids/Electrolytes/Nutrition: Monitor I/O. Added protein supplements to help with low protein stores.  8. Reactive leucocytosis: Resolving . Monitor wound and temperature curve daily. Low grade temp still  9. DM type 2:  stopped lantus---began low dose 70/30 insulin 5u bid  -encouarged more consistent po intake  -fair/good control at present 10. CKD: Monitor renal status closely as on IV Vancomycin. Avoid other nephrotoxic drugs.   11. HTN: Monitor BP tid on norvasc. Off prinizide and lasix due to AKI on CKD. Baseline Cr- 1.78 but is stabilizing to 2.3 range.   -increased coreg to 12.5mg  bid 12. Acute on chronic anemia: Low iron stores with high ferritin levels. Likely poor absorption. Transfused with 2 units on 12/17.  13.  Left buttock wound: S/p I & D 12/14--antibiotics complete  14. Resting tachycardia: HR in 110 range. Multifactorial due to obesity  anemia as well as deconditioning. Will monitor for symptoms with activity.  Bb increased  15.  Atelectasis encurage IS 16. Nausea---likely multifactorial due to abx, constipation    LOS (Days) 13 A FACE TO FACE EVALUATION WAS PERFORMED  SWARTZ,ZACHARY T 01/08/2014, 8:57 AM

## 2014-01-08 NOTE — Progress Notes (Signed)
ANTICOAGULATION CONSULT NOTE - Follow Up Consult  Pharmacy Consult for coumadin Indication: hx of PE/DVT  No Known Allergies  Patient Measurements: Weight: (!) 378 lb 1.4 oz (171.5 kg) Heparin Dosing Weight:   Vital Signs: Temp: 98.4 F (36.9 C) (12/31 0547) Temp Source: Oral (12/31 0547) BP: 149/79 mmHg (12/31 0547) Pulse Rate: 92 (12/31 0547)  Labs:  Recent Labs  01/06/14 0500 01/07/14 0545 01/07/14 1125 01/08/14 0539  HGB  --   --  8.1*  --   HCT  --   --  24.9*  --   PLT  --   --  380  --   LABPROT 27.6* 26.1*  --  30.5*  INR 2.55* 2.37*  --  2.89*    Estimated Creatinine Clearance: 80.3 mL/min (by C-G formula based on Cr of 2.1).   Medications:  Scheduled:  . amLODipine  10 mg Oral Daily  . calcium carbonate  1 tablet Oral TID WC  . carvedilol  12.5 mg Oral BID WC  . escitalopram  20 mg Oral QHS  . feeding supplement (GLUCERNA SHAKE)  237 mL Oral BID BM  . feeding supplement (PRO-STAT SUGAR FREE 64)  30 mL Oral TID WC  . furosemide  20 mg Oral Daily  . insulin aspart  0-15 Units Subcutaneous TID WC  . insulin aspart protamine- aspart  5 Units Subcutaneous BID WC  . pantoprazole  40 mg Oral BID  . pravastatin  20 mg Oral q1800  . senna-docusate  2 tablet Oral BID  . sorbitol  45 mL Oral Once  . tamsulosin  0.4 mg Oral QPC supper  . warfarin  0.5 mg Oral ONCE-1800  . Warfarin - Pharmacist Dosing Inpatient   Does not apply q1800   Infusions:    Assessment: 38 yo male with DVT/PE is currently on therapeutic coumadin but INR up to 2.89 from 2.37 after few doses of coumadin 1 mg.  Goal of Therapy:  INR 2-3 Monitor platelets by anticoagulation protocol: Yes   Plan:  - Coumadin 0.5 mg po x1 (patient will probably need 0.5 mg most days and 1 mg few days a week) - Daily INR  Sally-Ann Cutbirth, [name] 01/08/2014,8:30 AM

## 2014-01-08 NOTE — Progress Notes (Signed)
Occupational Therapy Session Note  Patient Details  Name: [name] MRN: [id number] Date of Birth: 09-11-1975  Today's Date: 01/08/2014 OT Individual Time: 0830-1000 OT Individual Time Calculation (min): 90 min    Short Term Goals: Week 2:  OT Short Term Goal 1 (Week 2): Pt will complete bathing at edge of bed and supine with min assist. OT Short Term Goal 2 (Week 2): Pt will complete transfer from w/c to [facility] with steadying assist OT Short Term Goal 3 (Week 2): Pt will don left sock and shoe using AE independently OT Short Term Goal 4 (Week 2): Pt will demo ability to complete simple meal prep at w/c level with setup assist OT Short Term Goal 5 (Week 2): Pt will complete UE HEP with supervision  Skilled Therapeutic Interventions/Progress Updates: ADL-retraining with focus on discharge planning, AE instruction, and adapted bathing/dressing.   Pt received supine in bed with RN attending to c/o nausea.   OT re-educated pt on need for specific details relating to plan for residing with girlfriend, ex: bed height, door widths, access to bathroom, as pt continues OT sessions without an expressed plan established.    Pt was then setup for bathing/dressing at edge of bed and supine with clarification that setup is intended to simulate process he will likely use s/p discharge.    Pt completed bathing supine and sitting at edge of bed, reaching his buttocks using lateral leans effectively this session.   Pt was instructed on use of sock aid and reacher to improve independence with lower body dressing.    Pt donned left sock and laced underwear and shorts using reacher.    Pt washed his back with LH sponge with min assist for thoroughness.    Pt groomed in bed and remained in bed at end of session with all needs within reach.     Pt continues to require prompts and cues to progress during session d/t slow performance of BADL with delays during activity while he appears to be internally processing his motor  plans.     Therapy Documentation Precautions:  Precautions Precautions: Fall Precaution Comments: New Rt BKA, with protector Required Braces or Orthoses: Knee Immobilizer - Right Restrictions Weight Bearing Restrictions: Yes RLE Weight Bearing: Non weight bearing Other Position/Activity Restrictions: new right BKA  Pain: Pain Assessment Pain Assessment: 0-10 Pain Score: 6  Pain Type: Surgical pain Pain Location: Leg Pain Orientation: Right;Distal Pain Descriptors / Indicators: Burning;Throbbing Pain Onset: On-going Pain Intervention(s): Rest;Repositioned Multiple Pain Sites: Yes 2nd Pain Site Pain Score: 6 Pain Type: Phantom pain Pain Location: Ankle Pain Orientation: Right Pain Descriptors / Indicators: Aching (feels like a sprain) Pain Onset: On-going Pain Intervention(s): Rest;Emotional support  See FIM for current functional status  Therapy/Group: Individual Therapy  [place] 01/08/2014, 12:49 PM

## 2014-01-09 HISTORY — PX: OTHER SURGICAL HISTORY: SHX169

## 2014-01-09 LAB — GLUCOSE, CAPILLARY
GLUCOSE-CAPILLARY: 151 mg/dL — AB (ref 70–99)
GLUCOSE-CAPILLARY: 184 mg/dL — AB (ref 70–99)
Glucose-Capillary: 164 mg/dL — ABNORMAL HIGH (ref 70–99)
Glucose-Capillary: 146 mg/dL — ABNORMAL HIGH (ref 70–99)

## 2014-01-09 LAB — PROTIME-INR
INR: 1.82 — ABNORMAL HIGH (ref 0.00–1.49)
INR: 1.65 — AB (ref 0.00–1.49)
PROTHROMBIN TIME: 21.2 s — AB (ref 11.6–15.2)
Prothrombin Time: 19.7 seconds — ABNORMAL HIGH (ref 11.6–15.2)

## 2014-01-09 MED ORDER — WARFARIN SODIUM 4 MG PO TABS
4.0000 mg | ORAL_TABLET | Freq: Once | ORAL | Status: AC
  Administered 2014-01-09: 4 mg via ORAL
  Filled 2014-01-09: qty 1

## 2014-01-09 MED ORDER — MEGESTROL ACETATE 400 MG/10ML PO SUSP
400.0000 mg | Freq: Two times a day (BID) | ORAL | Status: DC
  Administered 2014-01-09 – 2014-01-10 (×4): 400 mg via ORAL
  Filled 2014-01-09 (×13): qty 10

## 2014-01-09 NOTE — Plan of Care (Signed)
Problem: RH SKIN INTEGRITY Goal: RH STG ABLE TO PERFORM INCISION/WOUND CARE W/ASSISTANCE STG Able To Perform Incision/Wound Care With total Assistance.  Outcome: Not Applicable Date Met:  74/82/70 Pt has compression wrap tp stump,Staff perform dressing  With packing daily.

## 2014-01-09 NOTE — Progress Notes (Addendum)
ANTICOAGULATION CONSULT NOTE - Follow Up Consult  Pharmacy Consult for Coumadin Indication: history of pulmonary embolus and DVT  No Known Allergies  Patient Measurements: Weight: (!) 378 lb 1.4 oz (171.5 kg) Heparin Dosing Weight:   Vital Signs: Temp: 98.4 F (36.9 C) (01/01 0907) Temp Source: Oral (01/01 0907) BP: 157/79 mmHg (01/01 0907) Pulse Rate: 101 (01/01 0907)  Labs:  Recent Labs  01/07/14 1125 01/08/14 0539 01/09/14 0553 01/09/14 1253  HGB 8.1*  --   --   --   HCT 24.9*  --   --   --   PLT 380  --   --   --   LABPROT  --  30.5* 21.2* 19.7*  INR  --  2.89* 1.82* 1.65*    Estimated Creatinine Clearance: 80.3 mL/min (by C-G formula based on Cr of 2.1).   Medications:  Scheduled:  . amLODipine  10 mg Oral Daily  . calcium carbonate  1 tablet Oral TID WC  . carvedilol  12.5 mg Oral BID WC  . escitalopram  20 mg Oral QHS  . feeding supplement (GLUCERNA SHAKE)  237 mL Oral BID BM  . feeding supplement (PRO-STAT SUGAR FREE 64)  30 mL Oral TID WC  . furosemide  20 mg Oral Daily  . insulin aspart  0-15 Units Subcutaneous TID WC  . insulin aspart protamine- aspart  5 Units Subcutaneous BID WC  . megestrol  400 mg Oral BID  . pantoprazole  40 mg Oral BID  . pravastatin  20 mg Oral q1800  . senna-docusate  2 tablet Oral BID  . sorbitol  45 mL Oral Once  . tamsulosin  0.4 mg Oral QPC supper  . Warfarin - Pharmacist Dosing Inpatient   Does not apply q1800    Assessment: 39yo male with hx of PE and DVT, currently on Coumadin.  INR 1.82 this AM, a large drop and inconsistent with last few days results so repeated with result of 1.65.  No bleeding noted.  No missed doses, no new medications.  Goal of Therapy:  INR 2-3 Monitor platelets by anticoagulation protocol: Yes   Plan:  Coumadin 4mg  today Continue daily INR  Gracy Bruins, PharmD Clinical Pharmacist [facility]

## 2014-01-09 NOTE — Progress Notes (Signed)
Subjective/Complaints: Sleeping. Denies pain. Intake still minimal Objective: Vital Signs: Blood pressure 143/70, pulse 89, temperature 98.4 F (36.9 C), temperature source Oral, resp. rate 18, weight 171.5 kg (378 lb 1.4 oz), SpO2 96 %. No results found. Results for orders placed or performed during the hospital encounter of 12/26/13 (from the past 72 hour(s))  Glucose, capillary     Status: Abnormal   Collection Time: 01/06/14 11:18 AM  Result Value Ref Range   Glucose-Capillary 105 (H) 70 - 99 mg/dL  Glucose, capillary     Status: Abnormal   Collection Time: 01/06/14  4:40 PM  Result Value Ref Range   Glucose-Capillary 111 (H) 70 - 99 mg/dL  Glucose, capillary     Status: Abnormal   Collection Time: 01/06/14  9:01 PM  Result Value Ref Range   Glucose-Capillary 112 (H) 70 - 99 mg/dL  Protime-INR     Status: Abnormal   Collection Time: 01/07/14  5:45 AM  Result Value Ref Range   Prothrombin Time 26.1 (H) 11.6 - 15.2 seconds   INR 2.37 (H) 0.00 - 1.49  Glucose, capillary     Status: Abnormal   Collection Time: 01/07/14  7:03 AM  Result Value Ref Range   Glucose-Capillary 127 (H) 70 - 99 mg/dL  CBC     Status: Abnormal   Collection Time: 01/07/14 11:25 AM  Result Value Ref Range   WBC 8.8 4.0 - 10.5 K/uL   RBC 2.95 (L) 4.22 - 5.81 MIL/uL   Hemoglobin 8.1 (L) 13.0 - 17.0 g/dL   HCT 24.9 (L) 39.0 - 52.0 %   MCV 84.4 78.0 - 100.0 fL   MCH 27.5 26.0 - 34.0 pg   MCHC 32.5 30.0 - 36.0 g/dL   RDW 14.2 11.5 - 15.5 %   Platelets 380 150 - 400 K/uL  Glucose, capillary     Status: Abnormal   Collection Time: 01/07/14 11:42 AM  Result Value Ref Range   Glucose-Capillary 140 (H) 70 - 99 mg/dL  Glucose, capillary     Status: Abnormal   Collection Time: 01/07/14  4:56 PM  Result Value Ref Range   Glucose-Capillary 121 (H) 70 - 99 mg/dL  Glucose, capillary     Status: Abnormal   Collection Time: 01/07/14  9:17 PM  Result Value Ref Range   Glucose-Capillary 113 (H) 70 - 99 mg/dL    Comment 1 Notify RN   Protime-INR     Status: Abnormal   Collection Time: 01/08/14  5:39 AM  Result Value Ref Range   Prothrombin Time 30.5 (H) 11.6 - 15.2 seconds   INR 2.89 (H) 0.00 - 1.49  Glucose, capillary     Status: Abnormal   Collection Time: 01/08/14  6:50 AM  Result Value Ref Range   Glucose-Capillary 113 (H) 70 - 99 mg/dL   Comment 1 Notify RN   Glucose, capillary     Status: Abnormal   Collection Time: 01/08/14 12:06 PM  Result Value Ref Range   Glucose-Capillary 110 (H) 70 - 99 mg/dL   Comment 1 Notify RN   Glucose, capillary     Status: Abnormal   Collection Time: 01/08/14  4:29 PM  Result Value Ref Range   Glucose-Capillary 116 (H) 70 - 99 mg/dL   Comment 1 Notify RN   Glucose, capillary     Status: None   Collection Time: 01/08/14  8:49 PM  Result Value Ref Range   Glucose-Capillary 92 70 - 99 mg/dL  Protime-INR  Status: Abnormal   Collection Time: 01/09/14  5:53 AM  Result Value Ref Range   Prothrombin Time 21.2 (H) 11.6 - 15.2 seconds   INR 1.82 (H) 0.00 - 1.49  Glucose, capillary     Status: Abnormal   Collection Time: 01/09/14  6:56 AM  Result Value Ref Range   Glucose-Capillary 151 (H) 70 - 99 mg/dL     HEENT: normal Cardio: RRR and no murmurs Resp: CTA B/L and unlabored GI: BS positive and nontender nondistended Extremity:  Edema left stump Skin:   Wound clean with mild drainage.   Neuro: Alert/Oriented and Abnormal Motor decreased left knee flexion extension inhibited by pain Musc/Skel:  Right leg appropriately tender. Gen NAD   Assessment/Plan: 1. Functional deficits secondary to Right BKA which require 3+ hours per day of interdisciplinary therapy in a comprehensive inpatient rehab setting. Physiatrist is providing close team supervision and 24 hour management of active medical problems listed below. Physiatrist and rehab team continue to assess barriers to discharge/monitor patient progress toward functional and medical  goals. FIM: FIM - Bathing Bathing Steps Patient Completed: Chest, Right Arm, Left Arm, Abdomen, Front perineal area, Buttocks, Right upper leg, Left upper leg, Left lower leg (including foot) Bathing: 6: Assistive device (Comment) (LH sponge)  FIM - Upper Body Dressing/Undressing Upper body dressing/undressing steps patient completed: Thread/unthread right sleeve of pullover shirt/dresss, Thread/unthread left sleeve of pullover shirt/dress, Put head through opening of pull over shirt/dress, Pull shirt over trunk Upper body dressing/undressing: 5: Set-up assist to: Obtain clothing/put away FIM - Lower Body Dressing/Undressing Lower body dressing/undressing steps patient completed: Thread/unthread right underwear leg, Thread/unthread left underwear leg, Pull underwear up/down, Thread/unthread right pants leg, Thread/unthread left pants leg, Pull pants up/down, Don/Doff left sock, Fasten/unfasten pants Lower body dressing/undressing: 5: Set-up assist to: Obtain clothing  FIM - Toileting Toileting steps completed by patient: Performs perineal hygiene, Adjust clothing after toileting Toileting Assistive Devices: Grab bar or rail for support Toileting: 3: Mod-Patient completed 2 of 3 steps  FIM - Radio producer Devices: Recruitment consultant Transfers: 4-To toilet/BSC: Min A (steadying Pt. > 75%), 4-From toilet/BSC: Min A (steadying Pt. > 75%)  FIM - Bed/Chair Transfer Bed/Chair Transfer Assistive Devices: Walker, Arm rests Bed/Chair Transfer: 6: Supine > Sit: No assist, 6: Sit > Supine: No assist, 4: Bed > Chair or W/C: Min A (steadying Pt. > 75%), 4: Chair or W/C > Bed: Min A (steadying Pt. > 75%)  FIM - Locomotion: Wheelchair Distance: 150 Locomotion: Wheelchair: 5: Travels 150 ft or more: maneuvers on rugs and over door sills with supervision, cueing or coaxing FIM - Locomotion: Ambulation Locomotion: Ambulation Assistive Devices: Astronomer Ambulation/Gait Assistance: 1: +2 Total assist (+2 for close w/c follow) Locomotion: Ambulation: 0: Activity did not occur  Comprehension Comprehension Mode: Auditory Comprehension: 5-Understands complex 90% of the time/Cues < 10% of the time  Expression Expression Mode: Verbal Expression: 5-Expresses basic needs/ideas: With no assist  Social Interaction Social Interaction: 5-Interacts appropriately 90% of the time - Needs monitoring or encouragement for participation or interaction.  Problem Solving Problem Solving: 5-Solves complex 90% of the time/cues < 10% of the time  Memory Memory: 5-Recognizes or recalls 90% of the time/requires cueing < 10% of the time  Medical Problem List and Plan: 1. Functional deficits secondary to R-BKA, also severely deconditioned 2. DVT Prophylaxis/Anticoagulation: Pharmaceutical: Lovenox 3. Pain Management: Premedicate prior to therapy sessions. --  oxycodone prn    4. Mood: team to provide  ego support. Added lexapro for mood stabilization. LCSW to follow for evaluation and support.  .  5. Neuropsych: This patient is capable of making decisions on his own behalf. 6. Skin/Wound Care: Daily dressing changes to left buttock wound. Routine pressure relief measures. Monitor wound daily for healing. Maintain adequate nutrition. .  7. Fluids/Electrolytes/Nutrition: Monitor I/O. Added protein supplements to help with low protein stores.   -add megace  -RD consult 8. Reactive leucocytosis: Resolving . Monitor wound and temperature curve daily. Low grade temp still  9. DM type 2:  stopped lantus---began low dose 70/30 insulin 5u bid  -encouarged more consistent po intake  -fair/good control at present 10. CKD: Monitor renal status closely as on IV Vancomycin. Avoid other nephrotoxic drugs.   11. HTN: Monitor BP tid on norvasc. Off prinizide and lasix due to AKI on CKD. Baseline Cr- 1.78 but is stabilizing to 2.3 range.   -increased coreg to  12.5mg  bid 12. Acute on chronic anemia: Low iron stores with high ferritin levels. Likely poor absorption. Transfused with 2 units on 12/17.  13. Left buttock wound: S/p I & D 12/14--antibiotics complete  14. Resting tachycardia: HR in 110 range. Multifactorial due to obesity  anemia as well as deconditioning. Will monitor for symptoms with activity.  Bb increased  15.  Atelectasis encurage IS 16. Nausea---likely multifactorial due to abx, constipation    LOS (Days) 14 A FACE TO FACE EVALUATION WAS PERFORMED  SWARTZ,ZACHARY T 01/09/2014, 8:13 AM

## 2014-01-10 ENCOUNTER — Inpatient Hospital Stay (HOSPITAL_COMMUNITY): Payer: Medicaid Other | Admitting: *Deleted

## 2014-01-10 ENCOUNTER — Inpatient Hospital Stay (HOSPITAL_COMMUNITY): Payer: Medicaid Other | Admitting: Physical Therapy

## 2014-01-10 DIAGNOSIS — G546 Phantom limb syndrome with pain: Secondary | ICD-10-CM

## 2014-01-10 LAB — GLUCOSE, CAPILLARY
GLUCOSE-CAPILLARY: 216 mg/dL — AB (ref 70–99)
GLUCOSE-CAPILLARY: 129 mg/dL — AB (ref 70–99)
GLUCOSE-CAPILLARY: 151 mg/dL — AB (ref 70–99)
Glucose-Capillary: 153 mg/dL — ABNORMAL HIGH (ref 70–99)

## 2014-01-10 LAB — PROTIME-INR
INR: 1.73 — AB (ref 0.00–1.49)
Prothrombin Time: 20.4 seconds — ABNORMAL HIGH (ref 11.6–15.2)

## 2014-01-10 MED ORDER — GABAPENTIN 100 MG PO CAPS
100.0000 mg | ORAL_CAPSULE | Freq: Three times a day (TID) | ORAL | Status: DC
  Administered 2014-01-10 – 2014-01-13 (×10): 100 mg via ORAL
  Filled 2014-01-10 (×13): qty 1

## 2014-01-10 MED ORDER — WARFARIN SODIUM 4 MG PO TABS
4.0000 mg | ORAL_TABLET | Freq: Once | ORAL | Status: AC
  Administered 2014-01-10: 4 mg via ORAL
  Filled 2014-01-10: qty 1

## 2014-01-10 MED ORDER — ADULT MULTIVITAMIN W/MINERALS CH
1.0000 | ORAL_TABLET | Freq: Every day | ORAL | Status: DC
  Administered 2014-01-10 – 2014-01-20 (×11): 1 via ORAL
  Filled 2014-01-10 (×12): qty 1

## 2014-01-10 NOTE — Progress Notes (Signed)
ANTICOAGULATION CONSULT NOTE - Follow Up Consult  Pharmacy Consult for Coumadin Indication: history of pulmonary embolus and DVT  No Known Allergies  Patient Measurements: Weight: (!) 378 lb 1.4 oz (171.5 kg) Heparin Dosing Weight:   Vital Signs: Temp: 97.9 F (36.6 C) (01/02 0413) Temp Source: Oral (01/02 0413) BP: 164/69 mmHg (01/02 0413) Pulse Rate: 95 (01/02 0413)  Labs:  Recent Labs  01/09/14 0553 01/09/14 1253 01/10/14 0451  LABPROT 21.2* 19.7* 20.4*  INR 1.82* 1.65* 1.73*    Estimated Creatinine Clearance: 80.3 mL/min (by C-G formula based on Cr of 2.1).   Medications:  Scheduled:  . amLODipine  10 mg Oral Daily  . calcium carbonate  1 tablet Oral TID WC  . carvedilol  12.5 mg Oral BID WC  . escitalopram  20 mg Oral QHS  . feeding supplement (GLUCERNA SHAKE)  237 mL Oral BID BM  . feeding supplement (PRO-STAT SUGAR FREE 64)  30 mL Oral TID WC  . furosemide  20 mg Oral Daily  . gabapentin  100 mg Oral TID  . insulin aspart  0-15 Units Subcutaneous TID WC  . insulin aspart protamine- aspart  5 Units Subcutaneous BID WC  . megestrol  400 mg Oral BID  . pantoprazole  40 mg Oral BID  . pravastatin  20 mg Oral q1800  . senna-docusate  2 tablet Oral BID  . sorbitol  45 mL Oral Once  . tamsulosin  0.4 mg Oral QPC supper  . Warfarin - Pharmacist Dosing Inpatient   Does not apply q1800    Assessment: 39yo male with hx of PE and DVT, currently on Coumadin.  INR 1.73 today  No bleeding noted.   Goal of Therapy:  INR 2-3 Monitor platelets by anticoagulation protocol: Yes   Plan:  Coumadin 4mg  today Continue daily INR  Thanks for allowing pharmacy to be a part of this patient's care.  [name], PharmD Clinical Pharmacist, [phone]

## 2014-01-10 NOTE — Progress Notes (Signed)
Occupational Therapy Session Note  Patient Details  Name: [name] MRN: [id number] Date of Birth: February 04, 1975  Today's Date: 01/10/2014 OT Individual Time:  -   [phone]  (90 min)      Short Term Goals: Week 1:  OT Short Term Goal 1 (Week 1): Pt will complete bathing with mod assist at sit > stand level OT Short Term Goal 1 - Progress (Week 1): Revised due to lack of progress OT Short Term Goal 2 (Week 1): Pt will complete LB dressing with mod assist at sit > stand level OT Short Term Goal 2 - Progress (Week 1): Revised due to lack of progress OT Short Term Goal 3 (Week 1): Pt will complete toilet transfer with max assist of 1 caregiver OT Short Term Goal 3 - Progress (Week 1): Revised due to lack of progress OT Short Term Goal 4 (Week 1): Pt will tolerate out of bed activity for full 60 minute session with no episodes of nausea or vomiting OT Short Term Goal 4 - Progress (Week 1): Met  Skilled Therapeutic Interventions/Progress Updates:    Pt setup for bathing/dressing for Shower.  Pt. Having phantom pain 12/10 but able to participate in therapy.  He needed extra time for pain relief as session progressed.   Covered right leg and pic line.  Transferred from bed to wc to shower bench with min steadying assist.  Pt.needed minimal assist to propell wc into bathroom expecially on incline and postioning for transfers.   Pt. Utilized LH sponge during bathing.  Did lateral leans for peri area with minimal SBA for safety.  Pt. Transferred back to wc and then performed dressing at wc level.  Pt. Did sit to stand with SBA with walker. Pt donned pants to thighs without reacher and OT pulled to waist in standing.  Pt. Using reacher for picking up things but did not use during dressing.   Pt. Needed increased time for all activity due to pain.      .Pt  remained in wc at end of session with all needs within reach. Therapy Documentation Precautions:  Precautions Precautions: Fall Precaution  Comments: New Rt BKA, with protector Required Braces or Orthoses: Knee Immobilizer - Right Restrictions Weight Bearing Restrictions: Yes RLE Weight Bearing: Non weight bearing Other Position/Activity Restrictions: new right BKA    Pain: Pain Assessment Pain Score:10/10       See FIM for current functional status  Therapy/Group: Individual Therapy  Lisa Roca 01/10/2014, 8:20 AM

## 2014-01-10 NOTE — Progress Notes (Signed)
Physical Therapy Session Note  Patient Details  Name: [name] MRN: [id number] Date of Birth: 05/14/1975  Today's Date: 01/10/2014 PT Individual Time: 1100-1230 PT Individual Time Calculation (min): 90 min   Short Term Goals: Week 2:  PT Short Term Goal 1 (Week 2): = LTG which have been downgraded due to lack of progress  Skilled Therapeutic Interventions/Progress Updates:    Therapeutic Activity: PT instructs pt in w/c level cooking activity, making chicken and waffles. PT set up pt's kitchen according to how it was left 2 days ago and then had pt instruct PT in how to assist pt, ie - obtaining tools/ingredients from cabinets that pt could not reach (upper cabinets). Pt req min A with flipping the chicken in the hot grease pt due to difficult visibility, but otherwise manages the waffle maker - spraying it with oil, ladeling the mix into it, monitoring the doneness of the waffles. Pt is able to load the dishwasher with increased time and wipe the countertop from a w/c level.   W/C Management: PT instructs pt in w/c propulsion with BUEs x 200' req SBA and signficantly increased time for this distance.   For discharge, pt will require: A 24x18 manual w/c with a [place] with [employer] w/c cushion with a [employer] cover. Pt has a "full thickness" tunneling buttock wound and requires this for pressure relief.  A R amputee support for R residual limb and L swing away legrest Anti-tippers Full length armrests for appropriate positioning so pt can push off of anterior end of armrests to achieve sit to stand A bariatric RW  Continue per PT POC.   Therapy Documentation Precautions:  Precautions Precautions: Fall Precaution Comments: New Rt BKA, with protector Required Braces or Orthoses: Knee Immobilizer - Right Restrictions Weight Bearing Restrictions: Yes RLE Weight Bearing: Non weight bearing Other Position/Activity Restrictions: new right  BKA  Pain: Pain Assessment Pain Assessment: 0-10 Pain Score: 10-Worst pain ever Pain Type: Phantom pain Pain Location: Ankle Pain Orientation: Right Pain Descriptors / Indicators: Aching;Throbbing Pain Frequency: Constant Pain Onset: On-going Patients Stated Pain Goal: 4 Pain Intervention(s): Emotional support Multiple Pain Sites: No  See FIM for current functional status  Therapy/Group: Individual Therapy  [name] M 01/10/2014, 11:04 AM

## 2014-01-10 NOTE — Progress Notes (Signed)
NUTRITION FOLLOW UP/CONSULT  DOCUMENTATION CODES Per approved criteria  -Morbid Obesity   INTERVENTION: Provide Glucerna Shake po BID daily, each supplement provides 220 kcal and 10 grams of protein. Provide Snacks BID Provide Multivitamin with minerals daily  Encourage adequate PO intake.  NUTRITION DIAGNOSIS: Inadequate oral intake related to nausea as evidenced by meal completion of 10-50%; ongoing  Goal: Pt to meet >/= 90% of their estimated nutrition needs; not met  Monitor:  PO intake, weight trends, labs, I/O's  39 y.o. male  Admitting Dx: Amputation of right lower extremity below knee  ASSESSMENT: Pt with history of DM type 2 with diabetic foot ulcer, morbid obesity, HTN, CKD, on chronic coumadin for recurrent DVT/PE, medical non-compliance; who was admitted via MD office on 12/02 with fever chills, N/V and right foot pain. He underwent R-BKA with I and D right buttock abscess on 12/22/13.  RD consulted due to poor PO intake. Per nursing notes, pt has been eating 0% to 50% of meals. He has been refusing pro-stat and Glucerna shake supplements. Pt states he has not appetite. He denies any nausea, constipation or diarrhea. He states he will drink Glucerna if it is ice cold and he will try eating snacks. RD emphasized the importance of nutrition and encouraged pt to at least take a few bites of food at each meal. Pt's weight has trended up 5 lbs since 12/24.   Labs and medication reviewed.  Height: Ht Readings from Last 1 Encounters:  12/10/13 6' 2"  (1.88 m)    Weight: Wt Readings from Last 1 Encounters:  01/07/14 378 lb 1.4 oz (171.5 kg)  12/24.15 373 lb  BMI:  Body mass index is 48.52 kg/(m^2). Morbid obesity  Re-Estimated Nutritional Needs: Kcal: 2300-2600 Protein: 130-150 grams Fluid: 2.4 - 2.6 L/day  Skin: +2 RLE, +2 LLE edema, incision right hip, incision right leg  Diet Order: Diet regular   Intake/Output Summary (Last 24 hours) at 01/10/14  1703 Last data filed at 01/10/14 0800  Gross per 24 hour  Intake      0 ml  Output   1070 ml  Net  -1070 ml    Last BM: 1/1  Labs:   Recent Labs Lab 01/05/14 0515  NA 136  K 4.1  CL 107  CO2 23  BUN 19  CREATININE 2.10*  CALCIUM 8.0*  GLUCOSE 113*    CBG (last 3)   Recent Labs  01/10/14 0653 01/10/14 1244 01/10/14 1623  GLUCAP 216* 153* 129*    Scheduled Meds: . amLODipine  10 mg Oral Daily  . calcium carbonate  1 tablet Oral TID WC  . carvedilol  12.5 mg Oral BID WC  . escitalopram  20 mg Oral QHS  . feeding supplement (GLUCERNA SHAKE)  237 mL Oral BID BM  . feeding supplement (PRO-STAT SUGAR FREE 64)  30 mL Oral TID WC  . furosemide  20 mg Oral Daily  . gabapentin  100 mg Oral TID  . insulin aspart  0-15 Units Subcutaneous TID WC  . insulin aspart protamine- aspart  5 Units Subcutaneous BID WC  . megestrol  400 mg Oral BID  . pantoprazole  40 mg Oral BID  . pravastatin  20 mg Oral q1800  . senna-docusate  2 tablet Oral BID  . sorbitol  45 mL Oral Once  . tamsulosin  0.4 mg Oral QPC supper  . warfarin  4 mg Oral ONCE-1800  . Warfarin - Pharmacist Dosing Inpatient   Does not apply  q1800    Continuous Infusions:   Past Medical History  Diagnosis Date  . DVT (deep venous thrombosis) 09/2002    patient reports additional DVTs in '06 & '11 (unconfirmed)  . Pulmonary embolism 09/2002    treated with 6 months of warfarin  . Hypertension   . Chest pain, neg MI, normal coronaries by cath 02/18/2013  . Hyperlipidemia 02/19/2013  . Acute venous embolism and thrombosis of deep vessels of proximal lower extremity 07/19/2011  . Type I diabetes mellitus dx'd 2001  . GERD (gastroesophageal reflux disease)   . Diabetic ulcer of right foot   . CKD (chronic kidney disease) stage 3, GFR 30-59 ml/min 02/19/2013  . Nephrotic syndrome     Past Surgical History  Procedure Laterality Date  . Incision and drainage abscess  2007; 2015    "back"  . Cardiac  catheterization  02/18/2013    normal coronaries  . I&d extremity Right 12/14/2013    Procedure: IRRIGATION AND DEBRIDEMENT RIGHT FOOT;  Surgeon: [name], MD;  Location: [facility];  Service: Orthopedics;  Laterality: Right;  . Left heart catheterization with coronary angiogram N/A 02/18/2013    Procedure: LEFT HEART CATHETERIZATION WITH CORONARY ANGIOGRAM;  Surgeon: [name], MD;  Location: [facility] CATH LAB;  Service: Cardiovascular;  Laterality: N/A;  . Amputation Right 12/22/2013    Procedure: AMPUTATION BELOW KNEE;  Surgeon: [name], MD;  Location: [place];  Service: Orthopedics;  Laterality: Right;  . Incision and drainage of wound Right 12/22/2013    Procedure: I&D RIGHT BUTTOCK;  Surgeon: [name], MD;  Location: [place];  Service: Orthopedics;  Laterality: Right;   Pryor Ochoa RD, LDN Inpatient Clinical Dietitian Pager: [phone] After Hours Pager: [phone]

## 2014-01-10 NOTE — Progress Notes (Signed)
Subjective/Complaints: Sleeping. Denies pain. Intake still minimal Objective: Vital Signs: Blood pressure 164/69, pulse 95, temperature 97.9 F (36.6 C), temperature source Oral, resp. rate 18, weight 171.5 kg (378 lb 1.4 oz), SpO2 92 %. No results found. Results for orders placed or performed during the hospital encounter of 12/26/13 (from the past 72 hour(s))  CBC     Status: Abnormal   Collection Time: 01/07/14 11:25 AM  Result Value Ref Range   WBC 8.8 4.0 - 10.5 K/uL   RBC 2.95 (L) 4.22 - 5.81 MIL/uL   Hemoglobin 8.1 (L) 13.0 - 17.0 g/dL   HCT 24.9 (L) 39.0 - 52.0 %   MCV 84.4 78.0 - 100.0 fL   MCH 27.5 26.0 - 34.0 pg   MCHC 32.5 30.0 - 36.0 g/dL   RDW 14.2 11.5 - 15.5 %   Platelets 380 150 - 400 K/uL  Glucose, capillary     Status: Abnormal   Collection Time: 01/07/14 11:42 AM  Result Value Ref Range   Glucose-Capillary 140 (H) 70 - 99 mg/dL  Glucose, capillary     Status: Abnormal   Collection Time: 01/07/14  4:56 PM  Result Value Ref Range   Glucose-Capillary 121 (H) 70 - 99 mg/dL  Glucose, capillary     Status: Abnormal   Collection Time: 01/07/14  9:17 PM  Result Value Ref Range   Glucose-Capillary 113 (H) 70 - 99 mg/dL   Comment 1 Notify RN   Protime-INR     Status: Abnormal   Collection Time: 01/08/14  5:39 AM  Result Value Ref Range   Prothrombin Time 30.5 (H) 11.6 - 15.2 seconds   INR 2.89 (H) 0.00 - 1.49  Glucose, capillary     Status: Abnormal   Collection Time: 01/08/14  6:50 AM  Result Value Ref Range   Glucose-Capillary 113 (H) 70 - 99 mg/dL   Comment 1 Notify RN   Glucose, capillary     Status: Abnormal   Collection Time: 01/08/14 12:06 PM  Result Value Ref Range   Glucose-Capillary 110 (H) 70 - 99 mg/dL   Comment 1 Notify RN   Glucose, capillary     Status: Abnormal   Collection Time: 01/08/14  4:29 PM  Result Value Ref Range   Glucose-Capillary 116 (H) 70 - 99 mg/dL   Comment 1 Notify RN   Glucose, capillary     Status: None   Collection  Time: 01/08/14  8:49 PM  Result Value Ref Range   Glucose-Capillary 92 70 - 99 mg/dL  Protime-INR     Status: Abnormal   Collection Time: 01/09/14  5:53 AM  Result Value Ref Range   Prothrombin Time 21.2 (H) 11.6 - 15.2 seconds   INR 1.82 (H) 0.00 - 1.49  Glucose, capillary     Status: Abnormal   Collection Time: 01/09/14  6:56 AM  Result Value Ref Range   Glucose-Capillary 151 (H) 70 - 99 mg/dL  Glucose, capillary     Status: Abnormal   Collection Time: 01/09/14 11:29 AM  Result Value Ref Range   Glucose-Capillary 184 (H) 70 - 99 mg/dL  Protime-INR     Status: Abnormal   Collection Time: 01/09/14 12:53 PM  Result Value Ref Range   Prothrombin Time 19.7 (H) 11.6 - 15.2 seconds   INR 1.65 (H) 0.00 - 1.49  Glucose, capillary     Status: Abnormal   Collection Time: 01/09/14  5:07 PM  Result Value Ref Range   Glucose-Capillary 164 (H) 70 -  99 mg/dL  Glucose, capillary     Status: Abnormal   Collection Time: 01/09/14  8:52 PM  Result Value Ref Range   Glucose-Capillary 146 (H) 70 - 99 mg/dL  Protime-INR     Status: Abnormal   Collection Time: 01/10/14  4:51 AM  Result Value Ref Range   Prothrombin Time 20.4 (H) 11.6 - 15.2 seconds   INR 1.73 (H) 0.00 - 1.49  Glucose, capillary     Status: Abnormal   Collection Time: 01/10/14  6:53 AM  Result Value Ref Range   Glucose-Capillary 216 (H) 70 - 99 mg/dL     HEENT: normal Cardio: RRR and no murmurs Resp: CTA B/L and unlabored GI: BS positive and nontender nondistended Extremity:  Edema left stump Skin:   Wound clean with mild drainage from buttock--iodoform. Right stump with  Minimal serosang dc.  Neuro: Alert/Oriented and Abnormal Motor decreased left knee flexion extension inhibited by pain Musc/Skel:  Right leg appropriately tender. Gen NAD   Assessment/Plan: 1. Functional deficits secondary to Right BKA which require 3+ hours per day of interdisciplinary therapy in a comprehensive inpatient rehab setting. Physiatrist is  providing close team supervision and 24 hour management of active medical problems listed below. Physiatrist and rehab team continue to assess barriers to discharge/monitor patient progress toward functional and medical goals. FIM: FIM - Bathing Bathing Steps Patient Completed: Chest, Right Arm, Left Arm, Abdomen, Front perineal area, Buttocks, Right upper leg, Left upper leg, Left lower leg (including foot) Bathing: 6: Assistive device (Comment) (LH sponge)  FIM - Upper Body Dressing/Undressing Upper body dressing/undressing steps patient completed: Thread/unthread right sleeve of pullover shirt/dresss, Thread/unthread left sleeve of pullover shirt/dress, Put head through opening of pull over shirt/dress, Pull shirt over trunk Upper body dressing/undressing: 5: Set-up assist to: Obtain clothing/put away FIM - Lower Body Dressing/Undressing Lower body dressing/undressing steps patient completed: Thread/unthread right underwear leg, Thread/unthread left underwear leg, Pull underwear up/down, Thread/unthread right pants leg, Thread/unthread left pants leg, Pull pants up/down, Don/Doff left sock, Fasten/unfasten pants Lower body dressing/undressing: 5: Set-up assist to: Obtain clothing  FIM - Toileting Toileting steps completed by patient: Performs perineal hygiene, Adjust clothing prior to toileting Toileting Assistive Devices: Grab bar or rail for support Toileting: 3: Mod-Patient completed 2 of 3 steps  FIM - Radio producer Devices: Recruitment consultant Transfers: 4-To toilet/BSC: Min A (steadying Pt. > 75%), 4-From toilet/BSC: Min A (steadying Pt. > 75%)  FIM - Bed/Chair Transfer Bed/Chair Transfer Assistive Devices: Walker, Arm rests Bed/Chair Transfer: 6: Supine > Sit: No assist, 6: Sit > Supine: No assist, 4: Bed > Chair or W/C: Min A (steadying Pt. > 75%), 4: Chair or W/C > Bed: Min A (steadying Pt. > 75%)  FIM - Locomotion: Wheelchair Distance:  150 Locomotion: Wheelchair: 5: Travels 150 ft or more: maneuvers on rugs and over door sills with supervision, cueing or coaxing FIM - Locomotion: Ambulation Locomotion: Ambulation Assistive Devices: Administrator Ambulation/Gait Assistance: 1: +2 Total assist (+2 for close w/c follow) Locomotion: Ambulation: 0: Activity did not occur  Comprehension Comprehension Mode: Auditory Comprehension: 5-Understands basic 90% of the time/requires cueing < 10% of the time  Expression Expression Mode: Verbal Expression: 5-Expresses basic needs/ideas: With no assist  Social Interaction Social Interaction: 5-Interacts appropriately 90% of the time - Needs monitoring or encouragement for participation or interaction.  Problem Solving Problem Solving: 5-Solves complex 90% of the time/cues < 10% of the time  Memory Memory: 5-Recognizes or recalls 90%  of the time/requires cueing < 10% of the time  Medical Problem List and Plan: 1. Functional deficits secondary to R-BKA, also severely deconditioned  -daily wraps to right stump 2. DVT Prophylaxis/Anticoagulation: Pharmaceutical: Lovenox 3. Pain Management: Premedicate prior to therapy sessions. --  oxycodone prn  -added gabapentin for phantom pain    4. Mood: team to provide ego support. Added lexapro for mood stabilization. LCSW to follow for evaluation and support.  .  5. Neuropsych: This patient is capable of making decisions on his own behalf. 6. Skin/Wound Care: Daily dressing changes to left buttock wound. Routine pressure relief measures. Monitor wound daily for healing. Maintain adequate nutrition. .  7. Fluids/Electrolytes/Nutrition: Monitor I/O. Added protein supplements to help with low protein stores.   -added megace. Ate somewhat better last noc  -RD consult 8. Reactive leucocytosis: Resolving . Monitor wound and temperature curve daily. Low grade temp still  9. DM type 2:  stopped lantus---began low dose 70/30 insulin 5u  bid  -encouarged more consistent po intake  -fair/good control at present 10. CKD: Monitor renal status closely as on IV Vancomycin. Avoid other nephrotoxic drugs.   11. HTN: Monitor BP tid on norvasc. Off prinizide and lasix due to AKI on CKD. Baseline Cr- 1.78 but is stabilizing to 2.3 range.   -increased coreg to 12.5mg  bid 12. Acute on chronic anemia: Low iron stores with high ferritin levels. Likely poor absorption. Transfused with 2 units on 12/17.  13. Left buttock wound: S/p I & D 12/14--antibiotics complete  14. Resting tachycardia: HR in 110 range. Multifactorial due to obesity  anemia as well as deconditioning. Will monitor for symptoms with activity.  Bb increased  15.  Atelectasis encurage IS 16. Nausea---likely multifactorial due to abx, constipation    LOS (Days) 15 A FACE TO FACE EVALUATION WAS PERFORMED  Tajae Maiolo T 01/10/2014, 8:16 AM

## 2014-01-10 NOTE — Plan of Care (Signed)
Problem: RH PAIN MANAGEMENT Goal: RH STG PAIN MANAGED AT OR BELOW PT'S PAIN GOAL Less than 3 out of 10  Outcome: Not Progressing Pain scale 9/10 after pain medicine given.

## 2014-01-10 NOTE — Progress Notes (Signed)
Patient had c/o of pain  in RLE  Rating it 10/10, aching with spasms throughout. Provided the patient with PRN Oxy IR15 mg and Robaxin 500 mg. Provided phantom limb pain education and medication education to the patient. Will continue to monitor.  Oval Linsey, RN

## 2014-01-11 ENCOUNTER — Inpatient Hospital Stay (HOSPITAL_COMMUNITY): Payer: Medicaid Other | Admitting: *Deleted

## 2014-01-11 ENCOUNTER — Inpatient Hospital Stay (HOSPITAL_COMMUNITY): Payer: Medicaid Other

## 2014-01-11 LAB — GLUCOSE, CAPILLARY
GLUCOSE-CAPILLARY: 141 mg/dL — AB (ref 70–99)
Glucose-Capillary: 185 mg/dL — ABNORMAL HIGH (ref 70–99)
Glucose-Capillary: 134 mg/dL — ABNORMAL HIGH (ref 70–99)
Glucose-Capillary: 152 mg/dL — ABNORMAL HIGH (ref 70–99)

## 2014-01-11 LAB — PROTIME-INR
INR: 2.06 — ABNORMAL HIGH (ref 0.00–1.49)
Prothrombin Time: 23.4 seconds — ABNORMAL HIGH (ref 11.6–15.2)

## 2014-01-11 MED ORDER — WARFARIN SODIUM 2 MG PO TABS
2.0000 mg | ORAL_TABLET | Freq: Once | ORAL | Status: AC
  Administered 2014-01-11: 2 mg via ORAL
  Filled 2014-01-11: qty 1

## 2014-01-11 NOTE — Progress Notes (Signed)
ANTICOAGULATION CONSULT NOTE - Follow Up Consult  Pharmacy Consult for Coumadin Indication: history of pulmonary embolus and DVT  No Known Allergies  Patient Measurements: Weight: (!) 378 lb 1.4 oz (171.5 kg) Heparin Dosing Weight:   Vital Signs: Temp: 99 F (37.2 C) (01/03 0458) Temp Source: Oral (01/03 0458) BP: 142/73 mmHg (01/03 0801) Pulse Rate: 90 (01/03 0801)  Labs:  Recent Labs  01/09/14 1253 01/10/14 0451 01/11/14 0457  LABPROT 19.7* 20.4* 23.4*  INR 1.65* 1.73* 2.06*    Estimated Creatinine Clearance: 80.3 mL/min (by C-G formula based on Cr of 2.1).   Medications:  Scheduled:  . amLODipine  10 mg Oral Daily  . calcium carbonate  1 tablet Oral TID WC  . carvedilol  12.5 mg Oral BID WC  . escitalopram  20 mg Oral QHS  . feeding supplement (GLUCERNA SHAKE)  237 mL Oral BID BM  . furosemide  20 mg Oral Daily  . gabapentin  100 mg Oral TID  . insulin aspart  0-15 Units Subcutaneous TID WC  . insulin aspart protamine- aspart  5 Units Subcutaneous BID WC  . megestrol  400 mg Oral BID  . multivitamin with minerals  1 tablet Oral Daily  . pantoprazole  40 mg Oral BID  . pravastatin  20 mg Oral q1800  . senna-docusate  2 tablet Oral BID  . sorbitol  45 mL Oral Once  . tamsulosin  0.4 mg Oral QPC supper  . Warfarin - Pharmacist Dosing Inpatient   Does not apply q1800    Assessment: 39yo male with hx of PE and DVT, currently on Coumadin.  INR 2.06 today  No bleeding noted.   Goal of Therapy:  INR 2-3 Monitor platelets by anticoagulation protocol: Yes   Plan:  Coumadin 2 mg today Continue daily INR  Thanks for allowing pharmacy to be a part of this patient's care.  [name], PharmD Clinical Pharmacist, [phone]

## 2014-01-11 NOTE — Progress Notes (Signed)
Occupational Therapy Session Note  Patient Details  Name: [name] MRN: [id number] Date of Birth: 1975-12-28  Today's Date: 01/11/2014 OT Individual Time:  -   1100-1210  (70 min)  1st session      Short Term Goals: Week 1:  OT Short Term Goal 1 (Week 1): Pt will complete bathing with mod assist at sit > stand level OT Short Term Goal 1 - Progress (Week 1): Revised due to lack of progress OT Short Term Goal 2 (Week 1): Pt will complete LB dressing with mod assist at sit > stand level OT Short Term Goal 2 - Progress (Week 1): Revised due to lack of progress OT Short Term Goal 3 (Week 1): Pt will complete toilet transfer with max assist of 1 caregiver OT Short Term Goal 3 - Progress (Week 1): Revised due to lack of progress OT Short Term Goal 4 (Week 1): Pt will tolerate out of bed activity for full 60 minute session with no episodes of nausea or vomiting OT Short Term Goal 4 - Progress (Week 1): Met Week 2:  OT Short Term Goal 1 (Week 2): Pt will complete bathing at edge of bed and supine with min assist. OT Short Term Goal 2 (Week 2): Pt will complete transfer from w/c to [facility] with steadying assist OT Short Term Goal 3 (Week 2): Pt will don left sock and shoe using AE independently OT Short Term Goal 4 (Week 2): Pt will demo ability to complete simple meal prep at w/c level with setup assist OT Short Term Goal 5 (Week 2): Pt will complete UE HEP with supervision  Skilled Therapeutic Interventions/Progress Updates:    1st session:Pt. Sitting in wc.  Agreed to shower.  Assisted pt with set up for shower and dressing needs.  Pt. Needed minimal assist propelling wc into bathroom on incline and positioning for transfers.  Transferred to shower bench with minima assist.  Pt. Showered with AE and lateral leans for peri area.  Transferred to wc and finished dressing at wc level.  Did sit to stand x 3 for pants.  Pt remained in wc with all needs in reach.    Therapy Documentation Precautions:   Precautions Precautions: Fall Precaution Comments: New Rt BKA, with protector Required Braces or Orthoses: Knee Immobilizer - Right Restrictions Weight Bearing Restrictions: Yes RLE Weight Bearing: Non weight bearing Other Position/Activity Restrictions: new right BKA   Pain:  4/10  RLE phantom pain   1st and 2nd session    Time: 1400-1430  (30 min)2nd session: Individual session:  Addressed standing balance at HI LO table while playing checkers.  Pt. Stood for 3 minutes, rest and then another 1 minute with minimal assist.  Propelled wc about 100 feet with increased time.  Left pt in wc with    call bell,phone within reach.      See FIM for current functional status  Therapy/Group: Individual Therapy  Lisa Roca 01/11/2014, 11:32 AM

## 2014-01-11 NOTE — Progress Notes (Signed)
Physical Therapy Session Note  Patient Details  Name: [name] MRN: [id number] Date of Birth: 02/06/1975  Today's Date: 01/11/2014 PT Individual Time: 0800-0900 PT Individual Time Calculation (min): 60 min   Short Term Goals: Week 2:  PT Short Term Goal 1 (Week 2): = LTG which have been downgraded due to lack of progress  Skilled Therapeutic Interventions/Progress Updates:    Pt received supien in bed, agreeable to participate in therapy. Session focused on functional transfers, endurance, seated balance, RLE strengthening. Pt transferred bed>w/c and w/c<>mat table w/ RW and overall CGA w/ pt directing care appropriately for setup of w/c and RW. For seated balance/trunk control pt tossed 8# ball at rebounder while seated in w/c after scooting up in chair so that pt's back was unsupported 2x5' w/ intermittent seated rest breaks. Remainder of session focused on initiation of HEP for RLE strengthening with pt provided for handouts for seated LAQ, supine SLR, bridging w/ towel roll under residual limb, hip adduction and prone hip extension w/ knee extended. Pt required mod cueing to follow handout for HEP. Session ended in pt's room where pt was left seated in w/c w/ all needs within reach.   Therapy Documentation Precautions:  Precautions Precautions: Fall Precaution Comments: New Rt BKA, with protector Required Braces or Orthoses: Knee Immobilizer - Right Restrictions Weight Bearing Restrictions: Yes RLE Weight Bearing: Non weight bearing Other Position/Activity Restrictions: new right BKA Pain:  No/denies pain  See FIM for current functional status  Therapy/Group: Individual Therapy  [name]  [name], PT, DPT 01/11/2014, 7:28 AM

## 2014-01-11 NOTE — Progress Notes (Signed)
Subjective/Complaints: Phantom pain.  Objective: Vital Signs: Blood pressure 142/73, pulse 90, temperature 99 F (37.2 C), temperature source Oral, resp. rate 18, weight 171.5 kg (378 lb 1.4 oz), SpO2 97 %. No results found. Results for orders placed or performed during the hospital encounter of 12/26/13 (from the past 72 hour(s))  Glucose, capillary     Status: Abnormal   Collection Time: 01/08/14 12:06 PM  Result Value Ref Range   Glucose-Capillary 110 (H) 70 - 99 mg/dL   Comment 1 Notify RN   Glucose, capillary     Status: Abnormal   Collection Time: 01/08/14  4:29 PM  Result Value Ref Range   Glucose-Capillary 116 (H) 70 - 99 mg/dL   Comment 1 Notify RN   Glucose, capillary     Status: None   Collection Time: 01/08/14  8:49 PM  Result Value Ref Range   Glucose-Capillary 92 70 - 99 mg/dL  Protime-INR     Status: Abnormal   Collection Time: 01/09/14  5:53 AM  Result Value Ref Range   Prothrombin Time 21.2 (H) 11.6 - 15.2 seconds   INR 1.82 (H) 0.00 - 1.49  Glucose, capillary     Status: Abnormal   Collection Time: 01/09/14  6:56 AM  Result Value Ref Range   Glucose-Capillary 151 (H) 70 - 99 mg/dL  Glucose, capillary     Status: Abnormal   Collection Time: 01/09/14 11:29 AM  Result Value Ref Range   Glucose-Capillary 184 (H) 70 - 99 mg/dL  Protime-INR     Status: Abnormal   Collection Time: 01/09/14 12:53 PM  Result Value Ref Range   Prothrombin Time 19.7 (H) 11.6 - 15.2 seconds   INR 1.65 (H) 0.00 - 1.49  Glucose, capillary     Status: Abnormal   Collection Time: 01/09/14  5:07 PM  Result Value Ref Range   Glucose-Capillary 164 (H) 70 - 99 mg/dL  Glucose, capillary     Status: Abnormal   Collection Time: 01/09/14  8:52 PM  Result Value Ref Range   Glucose-Capillary 146 (H) 70 - 99 mg/dL  Protime-INR     Status: Abnormal   Collection Time: 01/10/14  4:51 AM  Result Value Ref Range   Prothrombin Time 20.4 (H) 11.6 - 15.2 seconds   INR 1.73 (H) 0.00 - 1.49   Glucose, capillary     Status: Abnormal   Collection Time: 01/10/14  6:53 AM  Result Value Ref Range   Glucose-Capillary 216 (H) 70 - 99 mg/dL  Glucose, capillary     Status: Abnormal   Collection Time: 01/10/14 12:44 PM  Result Value Ref Range   Glucose-Capillary 153 (H) 70 - 99 mg/dL  Glucose, capillary     Status: Abnormal   Collection Time: 01/10/14  4:23 PM  Result Value Ref Range   Glucose-Capillary 129 (H) 70 - 99 mg/dL  Glucose, capillary     Status: Abnormal   Collection Time: 01/10/14  9:16 PM  Result Value Ref Range   Glucose-Capillary 151 (H) 70 - 99 mg/dL  Protime-INR     Status: Abnormal   Collection Time: 01/11/14  4:57 AM  Result Value Ref Range   Prothrombin Time 23.4 (H) 11.6 - 15.2 seconds   INR 2.06 (H) 0.00 - 1.49  Glucose, capillary     Status: Abnormal   Collection Time: 01/11/14  7:01 AM  Result Value Ref Range   Glucose-Capillary 141 (H) 70 - 99 mg/dL     HEENT: normal Cardio: RRR  and no murmurs Resp: CTA B/L and unlabored GI: BS positive and nontender nondistended Extremity:  Edema left stump Skin:   Wound clean with mild drainage from buttock--iodoform. Right stump with  Minimal serosang dc.  Neuro: Alert/Oriented and Abnormal Motor decreased left knee flexion extension inhibited by pain Musc/Skel:  Right leg appropriately tender. Gen NAD   Assessment/Plan: 1. Functional deficits secondary to Right BKA which require 3+ hours per day of interdisciplinary therapy in a comprehensive inpatient rehab setting. Physiatrist is providing close team supervision and 24 hour management of active medical problems listed below. Physiatrist and rehab team continue to assess barriers to discharge/monitor patient progress toward functional and medical goals. FIM: FIM - Bathing Bathing Steps Patient Completed: Chest, Right Arm, Left Arm, Abdomen, Front perineal area, Buttocks, Right upper leg, Left upper leg, Left lower leg (including foot) Bathing: 6:  Assistive device (Comment)  FIM - Upper Body Dressing/Undressing Upper body dressing/undressing steps patient completed: Thread/unthread right sleeve of pullover shirt/dresss, Thread/unthread left sleeve of pullover shirt/dress, Put head through opening of pull over shirt/dress, Pull shirt over trunk Upper body dressing/undressing: 5: Set-up assist to: Obtain clothing/put away FIM - Lower Body Dressing/Undressing Lower body dressing/undressing steps patient completed: Thread/unthread right underwear leg, Thread/unthread left underwear leg, Pull underwear up/down, Thread/unthread right pants leg, Thread/unthread left pants leg, Pull pants up/down, Don/Doff left sock, Fasten/unfasten pants Lower body dressing/undressing: 4: Min-Patient completed 75 plus % of tasks  FIM - Toileting Toileting steps completed by patient: Performs perineal hygiene, Adjust clothing prior to toileting Toileting Assistive Devices: Grab bar or rail for support Toileting: 3: Mod-Patient completed 2 of 3 steps  FIM - Radio producer Devices: Recruitment consultant Transfers: 0-Activity did not occur  FIM - Control and instrumentation engineer Devices: Environmental consultant, Arm rests Bed/Chair Transfer: 0: Activity did not occur  FIM - Locomotion: Wheelchair Distance: 150 Locomotion: Wheelchair: 5: Travels 150 ft or more: maneuvers on rugs and over door sills with supervision, cueing or coaxing FIM - Locomotion: Ambulation Locomotion: Ambulation Assistive Devices: Administrator Ambulation/Gait Assistance: 1: +2 Total assist (+2 for close w/c follow) Locomotion: Ambulation: 0: Activity did not occur  Comprehension Comprehension Mode: Auditory Comprehension: 5-Follows basic conversation/direction: With extra time/assistive device  Expression Expression Mode: Verbal Expression: 5-Expresses basic needs/ideas: With extra time/assistive device  Social Interaction Social Interaction:  5-Interacts appropriately 90% of the time - Needs monitoring or encouragement for participation or interaction.  Problem Solving Problem Solving: 5-Solves basic 90% of the time/requires cueing < 10% of the time  Memory Memory: 6-More than reasonable amt of time  Medical Problem List and Plan: 1. Functional deficits secondary to R-BKA, also severely deconditioned  -daily wraps to right stump 2. DVT Prophylaxis/Anticoagulation: Pharmaceutical: Lovenox 3. Pain Management: Premedicate prior to therapy sessions. --  oxycodone prn  -added gabapentin for phantom pain    4. Mood: team to provide ego support. Added lexapro for mood stabilization. LCSW to follow for evaluation and support.  .  5. Neuropsych: This patient is capable of making decisions on his own behalf. 6. Skin/Wound Care: Daily dressing changes to left buttock wound. Routine pressure relief measures. Monitor wound daily for healing. Maintain adequate nutrition. .  7. Fluids/Electrolytes/Nutrition: Monitor I/O. Added protein supplements to help with low protein stores.   -added megace. Ate somewhat better last noc  -RD consult 8. Reactive leucocytosis: Resolving . Monitor wound and temperature curve daily. Low grade temp still  9. DM type 2:  stopped lantus---began low  dose 70/30 insulin 5u bid  -encouarged more consistent po intake--still not there  -fair/good control at present 10. CKD: Monitor renal status closely as on IV Vancomycin. Avoid other nephrotoxic drugs.   11. HTN: Monitor BP tid on norvasc. Off prinizide and lasix due to AKI on CKD. Baseline Cr- 1.78 but is stabilizing to 2.3 range.   -increased coreg to 12.5mg  bid 12. Acute on chronic anemia: Low iron stores with high ferritin levels. Likely poor absorption. Transfused with 2 units on 12/17.  13. Left buttock wound: S/p I & D 12/14--antibiotics complete  14. Resting tachycardia: HR in 110 range. Multifactorial due to obesity  anemia as well as  deconditioning. Will monitor for symptoms with activity.  Bb increased  15.  Atelectasis encurage IS 16. Nausea---likely multifactorial due to abx, constipation    LOS (Days) 16 A FACE TO FACE EVALUATION WAS PERFORMED  [name],[name] 01/11/2014, 8:20 AM

## 2014-01-12 ENCOUNTER — Inpatient Hospital Stay (HOSPITAL_COMMUNITY): Payer: Medicaid Other

## 2014-01-12 ENCOUNTER — Inpatient Hospital Stay (HOSPITAL_COMMUNITY): Payer: Medicaid Other | Admitting: Physical Therapy

## 2014-01-12 ENCOUNTER — Encounter (HOSPITAL_COMMUNITY): Payer: Self-pay

## 2014-01-12 LAB — GLUCOSE, CAPILLARY
GLUCOSE-CAPILLARY: 167 mg/dL — AB (ref 70–99)
GLUCOSE-CAPILLARY: 131 mg/dL — AB (ref 70–99)
Glucose-Capillary: 166 mg/dL — ABNORMAL HIGH (ref 70–99)
Glucose-Capillary: 198 mg/dL — ABNORMAL HIGH (ref 70–99)

## 2014-01-12 LAB — PROTIME-INR
INR: 1.61 — AB (ref 0.00–1.49)
Prothrombin Time: 19.3 seconds — ABNORMAL HIGH (ref 11.6–15.2)

## 2014-01-12 MED ORDER — WARFARIN SODIUM 4 MG PO TABS
4.0000 mg | ORAL_TABLET | Freq: Once | ORAL | Status: AC
  Administered 2014-01-12: 4 mg via ORAL
  Filled 2014-01-12: qty 1

## 2014-01-12 NOTE — Plan of Care (Signed)
Problem: RH BOWEL ELIMINATION Goal: RH STG MANAGE BOWEL WITH ASSISTANCE STG Manage Bowel with min Assistance.  Pt takes senna daily but has not had BM since 1/1. When offered laxative he refuses.

## 2014-01-12 NOTE — Progress Notes (Signed)
ANTICOAGULATION CONSULT NOTE - Follow Up Consult  Pharmacy Consult for coumadin Indication: hx DVT/PE  No Known Allergies  Patient Measurements: Weight: (!) 342 lb 12.8 oz (155.493 kg) Heparin Dosing Weight:   Vital Signs: Temp: 98.5 F (36.9 C) (01/04 0557) Temp Source: Oral (01/04 0557) BP: 117/53 mmHg (01/04 0557) Pulse Rate: 88 (01/04 0557)  Labs:  Recent Labs  01/10/14 0451 01/11/14 0457 01/12/14 0451  LABPROT 20.4* 23.4* 19.3*  INR 1.73* 2.06* 1.61*    Estimated Creatinine Clearance: 76 mL/min (by C-G formula based on Cr of 2.1).   Medications:  Scheduled:  . amLODipine  10 mg Oral Daily  . calcium carbonate  1 tablet Oral TID WC  . carvedilol  12.5 mg Oral BID WC  . escitalopram  20 mg Oral QHS  . feeding supplement (GLUCERNA SHAKE)  237 mL Oral BID BM  . furosemide  20 mg Oral Daily  . gabapentin  100 mg Oral TID  . insulin aspart  0-15 Units Subcutaneous TID WC  . insulin aspart protamine- aspart  5 Units Subcutaneous BID WC  . megestrol  400 mg Oral BID  . multivitamin with minerals  1 tablet Oral Daily  . pantoprazole  40 mg Oral BID  . pravastatin  20 mg Oral q1800  . senna-docusate  2 tablet Oral BID  . sorbitol  45 mL Oral Once  . tamsulosin  0.4 mg Oral QPC supper  . Warfarin - Pharmacist Dosing Inpatient   Does not apply q1800   Infusions:    Assessment: 39 yo male with hx of DVT/PE is currently on subtherapeutic coumadin.  INR today is down to 1.61 from 2.06.  Patient's coumadin dosing has been hard to determine since he only required 0.5-1 mg at one point and now seems to require more.  No doses were missed.  Goal of Therapy:  INR 2-3 Monitor platelets by anticoagulation protocol: Yes   Plan:  - coumadin 4 mg po x1 - INR in am  Kay Shippy, [name] 01/12/2014,8:14 AM

## 2014-01-12 NOTE — Progress Notes (Signed)
Occupational Therapy Session Note  Patient Details  Name: [name] MRN: [id number] Date of Birth: 01/21/1975  Today's Date: 01/12/2014 OT Individual Time:0930-1100 49 Min       Short Term Goals: Week 2:  OT Short Term Goal 1 (Week 2): Pt will complete bathing at edge of bed and supine with min assist. OT Short Term Goal 2 (Week 2): Pt will complete transfer from w/c to [facility] with steadying assist OT Short Term Goal 3 (Week 2): Pt will don left sock and shoe using AE independently OT Short Term Goal 4 (Week 2): Pt will demo ability to complete simple meal prep at w/c level with setup assist OT Short Term Goal 5 (Week 2): Pt will complete UE HEP with supervision  Skilled Therapeutic Interventions/Progress Updates: ADL-retraining with focus on improved activity tolerance, transfers, sit>stand, static standing, dynamic sitting balance and w/c management.    Pt received supine in bed, alert and attentive with improved motivation to progress through treatment efficiently.   Pt rose from supine to sitting at edge of bed using rail, completed stand-pivot transfer to w/c using RW with only setup and supervision assist and performed squat pivot transfer from w/c to tub bench for bathing, again with only setup assist to position w/c in bathroom d/t small bathroom size.    Pt progressed through shower unassisted and returned to w/c to dress, standing supported at [facility] with only min assist to don left sock and pull up pants.    Pt groomed and progressed to therapeutic activity planned: meal prep in kitchen.   Pt completed pre-cooking meal prep at w/c level with only setup assist to gather supplies.   Pt was escorted back to his room with plan for assist with cooking meal at end of therapy treatment with supervision for safety.     Therapy Documentation Precautions:  Precautions Precautions: Fall Precaution Comments: New Rt BKA, with protector Required Braces or Orthoses: Knee Immobilizer -  Right Restrictions Weight Bearing Restrictions: Yes RLE Weight Bearing: Non weight bearing Other Position/Activity Restrictions: new right BKA  Vital Signs: Therapy Vitals Temp: 98.5 F (36.9 C) Temp Source: Oral Pulse Rate: 88 Resp: 18 BP: (!) 117/53 mmHg Patient Position (if appropriate): Lying Oxygen Therapy SpO2: 100 % O2 Device: Not Delivered   Pain: 5/10, residual R-LE     See FIM for current functional status  Therapy/Group: Individual Therapy  Ayjah Show 01/12/2014, 7:01 AM

## 2014-01-12 NOTE — Progress Notes (Signed)
Subjective/Complaints:  Concerned that he's having some drainage from wound   Objective: Vital Signs: Blood pressure 117/53, pulse 88, temperature 98.5 F (36.9 C), temperature source Oral, resp. rate 18, weight 155.493 kg (342 lb 12.8 oz), SpO2 100 %. No results found. Results for orders placed or performed during the hospital encounter of 12/26/13 (from the past 72 hour(s))  Glucose, capillary     Status: Abnormal   Collection Time: 01/09/14 11:29 AM  Result Value Ref Range   Glucose-Capillary 184 (H) 70 - 99 mg/dL  Protime-INR     Status: Abnormal   Collection Time: 01/09/14 12:53 PM  Result Value Ref Range   Prothrombin Time 19.7 (H) 11.6 - 15.2 seconds   INR 1.65 (H) 0.00 - 1.49  Glucose, capillary     Status: Abnormal   Collection Time: 01/09/14  5:07 PM  Result Value Ref Range   Glucose-Capillary 164 (H) 70 - 99 mg/dL  Glucose, capillary     Status: Abnormal   Collection Time: 01/09/14  8:52 PM  Result Value Ref Range   Glucose-Capillary 146 (H) 70 - 99 mg/dL  Protime-INR     Status: Abnormal   Collection Time: 01/10/14  4:51 AM  Result Value Ref Range   Prothrombin Time 20.4 (H) 11.6 - 15.2 seconds   INR 1.73 (H) 0.00 - 1.49  Glucose, capillary     Status: Abnormal   Collection Time: 01/10/14  6:53 AM  Result Value Ref Range   Glucose-Capillary 216 (H) 70 - 99 mg/dL  Glucose, capillary     Status: Abnormal   Collection Time: 01/10/14 12:44 PM  Result Value Ref Range   Glucose-Capillary 153 (H) 70 - 99 mg/dL  Glucose, capillary     Status: Abnormal   Collection Time: 01/10/14  4:23 PM  Result Value Ref Range   Glucose-Capillary 129 (H) 70 - 99 mg/dL  Glucose, capillary     Status: Abnormal   Collection Time: 01/10/14  9:16 PM  Result Value Ref Range   Glucose-Capillary 151 (H) 70 - 99 mg/dL  Protime-INR     Status: Abnormal   Collection Time: 01/11/14  4:57 AM  Result Value Ref Range   Prothrombin Time 23.4 (H) 11.6 - 15.2 seconds   INR 2.06 (H) 0.00 -  1.49  Glucose, capillary     Status: Abnormal   Collection Time: 01/11/14  7:01 AM  Result Value Ref Range   Glucose-Capillary 141 (H) 70 - 99 mg/dL  Glucose, capillary     Status: Abnormal   Collection Time: 01/11/14 11:21 AM  Result Value Ref Range   Glucose-Capillary 185 (H) 70 - 99 mg/dL  Glucose, capillary     Status: Abnormal   Collection Time: 01/11/14  4:48 PM  Result Value Ref Range   Glucose-Capillary 134 (H) 70 - 99 mg/dL  Glucose, capillary     Status: Abnormal   Collection Time: 01/11/14  8:58 PM  Result Value Ref Range   Glucose-Capillary 152 (H) 70 - 99 mg/dL  Protime-INR     Status: Abnormal   Collection Time: 01/12/14  4:51 AM  Result Value Ref Range   Prothrombin Time 19.3 (H) 11.6 - 15.2 seconds   INR 1.61 (H) 0.00 - 1.49  Glucose, capillary     Status: Abnormal   Collection Time: 01/12/14  6:41 AM  Result Value Ref Range   Glucose-Capillary 167 (H) 70 - 99 mg/dL     HEENT: normal Cardio: RRR and no murmurs Resp: CTA  B/L and unlabored GI: BS positive and nontender nondistended Extremity:  Edema left stump Skin:   Wound clean with mild drainage from buttock--iodoform. Right stump with  Minimal to mild serosang dc along lateral aspect.  Neuro: Alert/Oriented and Abnormal Motor decreased left knee flexion extension inhibited by pain Musc/Skel:  Right leg appropriately tender. Gen NAD   Assessment/Plan: 1. Functional deficits secondary to Right BKA which require 3+ hours per day of interdisciplinary therapy in a comprehensive inpatient rehab setting. Physiatrist is providing close team supervision and 24 hour management of active medical problems listed below. Physiatrist and rehab team continue to assess barriers to discharge/monitor patient progress toward functional and medical goals. FIM: FIM - Bathing Bathing Steps Patient Completed: Chest, Right Arm, Left Arm, Abdomen, Front perineal area, Buttocks, Right upper leg, Left upper leg, Left lower leg  (including foot) Bathing: 6: Assistive device (Comment)  FIM - Upper Body Dressing/Undressing Upper body dressing/undressing steps patient completed: Thread/unthread right sleeve of pullover shirt/dresss, Thread/unthread left sleeve of pullover shirt/dress, Put head through opening of pull over shirt/dress, Pull shirt over trunk Upper body dressing/undressing: 5: Set-up assist to: Obtain clothing/put away FIM - Lower Body Dressing/Undressing Lower body dressing/undressing steps patient completed: Thread/unthread right underwear leg, Thread/unthread left underwear leg, Pull underwear up/down, Thread/unthread right pants leg, Thread/unthread left pants leg, Don/Doff left sock Lower body dressing/undressing: 4: Min-Patient completed 75 plus % of tasks  FIM - Toileting Toileting steps completed by patient: Performs perineal hygiene, Adjust clothing prior to toileting Toileting Assistive Devices: Grab bar or rail for support Toileting: 3: Mod-Patient completed 2 of 3 steps  FIM - Radio producer Devices: Recruitment consultant Transfers: 0-Activity did not occur  FIM - Control and instrumentation engineer Devices: Environmental consultant, Arm rests Bed/Chair Transfer: 6: Supine > Sit: No assist, 6: Sit > Supine: No assist, 4: Bed > Chair or W/C: Min A (steadying Pt. > 75%), 4: Chair or W/C > Bed: Min A (steadying Pt. > 75%)  FIM - Locomotion: Wheelchair Distance: 150 Locomotion: Wheelchair: 5: Travels 150 ft or more: maneuvers on rugs and over door sills with supervision, cueing or coaxing FIM - Locomotion: Ambulation Locomotion: Ambulation Assistive Devices: Administrator Ambulation/Gait Assistance: 1: +2 Total assist (+2 for close w/c follow) Locomotion: Ambulation: 0: Activity did not occur  Comprehension Comprehension Mode: Auditory Comprehension: 5-Follows basic conversation/direction: With extra time/assistive device  Expression Expression Mode:  Verbal Expression: 5-Expresses basic needs/ideas: With extra time/assistive device  Social Interaction Social Interaction: 5-Interacts appropriately 90% of the time - Needs monitoring or encouragement for participation or interaction.  Problem Solving Problem Solving: 5-Solves basic 90% of the time/requires cueing < 10% of the time  Memory Memory: 6-More than reasonable amt of time  Medical Problem List and Plan: 1. Functional deficits secondary to R-BKA, also severely deconditioned  -daily wraps to right stump 2. DVT Prophylaxis/Anticoagulation: Pharmaceutical: Lovenox 3. Pain Management: Premedicate prior to therapy sessions. --  oxycodone prn  -added gabapentin for phantom pain    4. Mood: team to provide ego support. Added lexapro for mood stabilization. LCSW to follow for evaluation and support.  .  5. Neuropsych: This patient is capable of making decisions on his own behalf. 6. Skin/Wound Care: Daily dressing changes to left buttock wound. Routine pressure relief measures. Monitor wound daily for healing. Maintain adequate nutrition. .   -assured pt that some s/s drainage is to be expected from wound  -afebrile  -ACE wrap with dressing only---no shrinker 7.  Fluids/Electrolytes/Nutrition: Monitor I/O. Added protein supplements to help with low protein stores.   -continue megace.    -RD consult 8. Reactive leucocytosis: Resolving . Monitor wound and temperature curve daily. Low grade temp still  9. DM type 2:  stopped lantus---began low dose 70/30 insulin 5u bid  -encouarged more consistent po intake--still not there  -fair/good control at present 10. CKD: Monitor renal status closely as on IV Vancomycin. Avoid other nephrotoxic drugs.   11. HTN: Monitor BP tid on norvasc. Off prinizide and lasix due to AKI on CKD. Baseline Cr- 1.78 but is stabilizing to 2.3 range.   -increased coreg to 12.5mg  bid 12. Acute on chronic anemia: Low iron stores with high ferritin levels.  Likely poor absorption. Transfused with 2 units on 12/17.  13. Left buttock wound: S/p I & D 12/14--antibiotics complete  14. Resting tachycardia: HR in 110 range. Multifactorial due to obesity  anemia as well as deconditioning. Will monitor for symptoms with activity.  Bb increased  15.  Atelectasis encurage IS      LOS (Days) 17 A FACE TO FACE EVALUATION WAS PERFORMED  SWARTZ,ZACHARY T 01/12/2014, 7:36 AM

## 2014-01-12 NOTE — Progress Notes (Signed)
Physical Therapy Session Note  Patient Details  Name: [name] MRN: [id number] Date of Birth: [birth year]  Today's Date: 01/12/2014 PT Individual Time: 1300-1400 PT Individual Time Calculation (min): 60 min   Session 2 Time: 1430-1500 Time Calculation (min): 30  Short Term Goals: Week 2:  PT Short Term Goal 1 (Week 2): = LTG which have been downgraded due to lack of progress  Skilled Therapeutic Interventions/Progress Updates:    Session 1: Pt received seated in w/c, agreeable to participate in therapy. Session focused on standing balance, ambulation, w/c propulsion. For functional endurance pt propelled w/c w/ BUE and extra time 100' to rehab gym. Pt ambulated 15'x3 w/ RW, hop to gait and overall Min Guard A. During session pt's MD gave verbal order that pt did not have to wear Limb Guard. While doffing limbguard pt's dressing fell off residual limb and pt's RN came to gym to redress limb. Blocked practice for sit<>stands x3 w/ overall SBA to steady bari RW. In standing w/ bari RW activity of reaching anterior outside BOS w/ LUE to address dynamic balance. Session ended in pt's room, where pt was left seated in w/c w/ representative from amputee support group present and all needs within reach.   Session 2: Pt received seated in w/c, agreeable to participate in therapy. Pt transported to ADL kitchen for cooking activity. Pt performed reaching outside BOS from w/c level for dynamic seated balance and instructed PT appropriately in how to assist with task (reaching for objects in higher cabinets, using waffle iron, etc.). Pt able to safely and appropriately conduct cooking task with assist of PT and OT. Pt left in kitchen with OT to complete task.   Due to improved pt progress this therapist re-activated previously discharged ambulation goals. Pt has good potential to reach supervision for household distance ambulation with bari RW. Will continue to assess most appropriate discharge  plan   Therapy Documentation Precautions:  Precautions Precautions: Fall Precaution Comments: New Rt BKA, with protector Required Braces or Orthoses: Knee Immobilizer - Right Restrictions Weight Bearing Restrictions: Yes RLE Weight Bearing: Non weight bearing Other Position/Activity Restrictions: new right BKA Pain: Pain Assessment Pain Score:  ("better") Pain Type: Surgical pain;Phantom pain Pain Location: Leg Pain Orientation: Right Pain Descriptors / Indicators: Aching Pain Intervention(s): Received medication from RN prior to session  See FIM for current functional status  Therapy/Group: Individual Therapy  [name]  [name], PT, DPT 01/12/2014, 7:33 AM

## 2014-01-12 NOTE — Progress Notes (Signed)
Physical Therapy Session Note  Patient Details  Name: [name] MRN: [id number] Date of Birth: November 14, 1975  Today's Date: 01/12/2014 PT Individual Time: 1530-1600 PT Individual Time Calculation (min): 30 min MAKE UP SESSION  Short Term Goals: Week 2:  PT Short Term Goal 1 (Week 2): = LTG which have been downgraded due to lack of progress  Skilled Therapeutic Interventions/Progress Updates:   Pt received in ADL kitchen after completing cooking task, agreeable to makeup session. Pt propelled w/c using BUE x 50 ft to therapy gym and removed leg rests with supervision. Pt instructed in pushing through UEs instead of "jumping" on LLE in order to decrease jarring to R residual limb due to increased pain. Pt educated on phantom pain and answered questions from patient. Gait training using bariatric RW with therapist providing S-min guard and advancing w/c behind patient, total of 20 ft with 4 seated rest breaks due to 9/10 phantom limb pain. Pt with 1 episode of sitting suddenly in w/c without telling therapist and w/c brakes unlocked, pt verbalized behavior was unsafe and ensured w/c was locked before sitting the rest of session. Pt transported back to room and left sitting in w/c with all needs within reach.   Therapy Documentation Precautions:  Precautions Precautions: Fall Precaution Comments: New Rt BKA, with protector Required Braces or Orthoses: Knee Immobilizer - Right Restrictions Weight Bearing Restrictions: Yes RLE Weight Bearing: Non weight bearing Other Position/Activity Restrictions: new right BKA Vital Signs: Therapy Vitals Temp: 98.7 F (37.1 C) Temp Source: Oral Pulse Rate: 81 Resp: 16 BP: (!) 108/48 mmHg Patient Position (if appropriate): Sitting Oxygen Therapy SpO2: 99 % O2 Device: Not Delivered Pain: 9/10 RLE phantom pain   See FIM for current functional status  Therapy/Group: Individual Therapy  Laretta Alstrom 01/12/2014, 4:09 PM

## 2014-01-12 NOTE — Consult Note (Signed)
  DIAGNOSTIC EVALUATION - [facility]   MEDICAL NECESSITY:  [name] was seen on the [place] [facility] for an initial diagnostic evaluation owing to the patient's diagnosis of amputation of lower extremity below the knee.    According to medical records, [name] was admitted to the rehab unit owing to "Functional deficits secondary to.BKA." Records also indicate that he is a "39 y.o. male with history of DM type 2 with diabetic foot ulcer, morbid obesity, HTN, CKD, on chronic Coumadin for recurrent DVT/PE, medical non-compliance; who was admitted via MD office on 12/02 with fever, chills, N/V and right foot pain. He was started on IV antibiotics for cellulitis right foot as well as IVF for AKI on CKD.Patient continued to have poor wound healing with wet gangrene and failed attempts at limb salvage. He underwent R-BKA.on 12/22/13.  He has had issues with depressed mood and difficulty dealing with amputation.    [name] was previously seen by this provider on 01/05/2014. Pertinent information from that visit is as follows:   Overall, [name] denied suffering from any major cognitive difficulty. On the surface he appears to be adjusting well to his present medical situation but I think there is a healthy level of guardedness present which his okay for now. I spent a majority of the time discussing resources available to individuals such as him (i.e., amputee support group; individual psychotherapy) and I encouraged him to take advantage of these services. I plan to follow-up with him for coping throughout this admission.  During today's visit, [name] endorsed experiencing mild trepidation about the future in light if his present medical situation. For example, he wants to continue to complete his education and obtain gainful employment. He also does not want to feel sorry for himself and he is determined to get better.  Fortunately, he believes that he is making excellent progress in therapy.   Today, [name] acknowledged experiencing some down feeling owing to what happened but he remains quite positive. I feel less that he is emotionally guarded. He plans to discharge to his girlfriend's residence which he feels good about. He has ample social support. Suicidal/homicidal ideation, plan or intent was denied. No manic or hypomanic episodes were reported. The patient denied ever experiencing any auditory/visual hallucinations. No major behavioral or personality changes were endorsed.   PROCEDURES ADMINISTERED: [1 unit [id number] Diagnostic clinical interview  Review of available records  Behavioral Evaluation: [name] was appropriately dressed for season and situation, and he appeared tidy and well-groomed. Normal posture was noted. He was friendly and rapport was easily established. His speech was as expected and he was able to express ideas effectively. His affect was appropriately modulated. Attention and motivation were good.    Overall, [name] is doing quite well and is adjusting well to his present situation. I encouraged him to follow-up with the amputee support group for support and education. No formal follow-up with neuropsychology seems warranted for the remainder of his admission unless requested by the patient or staff. Prognosis seems excellent.    DIAGNOSIS:  BKA    Rutha Bouchard, Psy.D.  Clinical Neuropsychologist  Rehabilitation Psychologist

## 2014-01-13 ENCOUNTER — Inpatient Hospital Stay (HOSPITAL_COMMUNITY): Payer: Medicaid Other | Admitting: *Deleted

## 2014-01-13 ENCOUNTER — Inpatient Hospital Stay (HOSPITAL_COMMUNITY): Payer: Medicaid Other

## 2014-01-13 LAB — URINE MICROSCOPIC-ADD ON

## 2014-01-13 LAB — URINALYSIS, ROUTINE W REFLEX MICROSCOPIC
BILIRUBIN URINE: NEGATIVE
Glucose, UA: 250 mg/dL — AB
KETONES UR: NEGATIVE mg/dL
Leukocytes, UA: NEGATIVE
Nitrite: NEGATIVE
PH: 5 (ref 5.0–8.0)
Protein, ur: 100 mg/dL — AB
Specific Gravity, Urine: 1.016 (ref 1.005–1.030)
UROBILINOGEN UA: 0.2 mg/dL (ref 0.0–1.0)

## 2014-01-13 LAB — PROTIME-INR
INR: 1.43 (ref 0.00–1.49)
PROTHROMBIN TIME: 17.6 s — AB (ref 11.6–15.2)

## 2014-01-13 LAB — GLUCOSE, CAPILLARY
GLUCOSE-CAPILLARY: 171 mg/dL — AB (ref 70–99)
Glucose-Capillary: 274 mg/dL — ABNORMAL HIGH (ref 70–99)
Glucose-Capillary: 100 mg/dL — ABNORMAL HIGH (ref 70–99)
Glucose-Capillary: 139 mg/dL — ABNORMAL HIGH (ref 70–99)

## 2014-01-13 MED ORDER — INSULIN ASPART PROT & ASPART (70-30 MIX) 100 UNIT/ML ~~LOC~~ SUSP
10.0000 [IU] | Freq: Two times a day (BID) | SUBCUTANEOUS | Status: DC
  Administered 2014-01-13 – 2014-01-20 (×14): 10 [IU] via SUBCUTANEOUS
  Filled 2014-01-13: qty 10

## 2014-01-13 MED ORDER — GABAPENTIN 100 MG PO CAPS
200.0000 mg | ORAL_CAPSULE | Freq: Three times a day (TID) | ORAL | Status: DC
  Administered 2014-01-13 – 2014-01-15 (×8): 200 mg via ORAL
  Filled 2014-01-13 (×13): qty 2

## 2014-01-13 MED ORDER — WARFARIN SODIUM 7.5 MG PO TABS
7.5000 mg | ORAL_TABLET | Freq: Once | ORAL | Status: AC
Start: 2014-01-13 — End: 2014-01-13
  Administered 2014-01-13: 7.5 mg via ORAL
  Filled 2014-01-13 (×2): qty 1

## 2014-01-13 NOTE — Plan of Care (Signed)
Problem: RH Bed to Chair Transfers Goal: LTG Patient will perform bed/chair transfers w/assist (PT) LTG: Patient will perform bed/chair transfers with assistance, with/without cues (PT).  Upgraded 01/13/14 due to patient progress

## 2014-01-13 NOTE — Progress Notes (Signed)
Occupational Therapy Weekly Progress Note  Patient Details  Name: [name] MRN: [id number] Date of Birth: 27-Jul-1975  Beginning of progress report period: January 05, 2014 End of progress report period: January 13, 2013  Today's Date: 01/13/2014 OT Individual Time: 0830-1000 OT Individual Time Calculation (min): 90 min    Patient has met 3 of 5 short term goals.  Patient progressing with HEP program and partly met goal to don sock and shoe using AE as he is able to don sock with or without sock aid.     Patient continues to demonstrate the following deficits: Impaired standing tolerance and balance, BUE weakness limiting ability to complete hop-steps needed to enter bathroom and bedroom, impaired independence with standing transfer skills, discharge planning relating to organization of intended living environment and therefore will continue to benefit from skilled OT intervention to enhance overall performance with BADL and iADL.  Patient progressing toward long term goals..  Continue plan of care.  OT Short Term Goals Week 1:  OT Short Term Goal 1 (Week 1): Pt will complete bathing with mod assist at sit > stand level OT Short Term Goal 1 - Progress (Week 1): Revised due to lack of progress OT Short Term Goal 2 (Week 1): Pt will complete LB dressing with mod assist at sit > stand level OT Short Term Goal 2 - Progress (Week 1): Revised due to lack of progress OT Short Term Goal 3 (Week 1): Pt will complete toilet transfer with max assist of 1 caregiver OT Short Term Goal 3 - Progress (Week 1): Revised due to lack of progress OT Short Term Goal 4 (Week 1): Pt will tolerate out of bed activity for full 60 minute session with no episodes of nausea or vomiting OT Short Term Goal 4 - Progress (Week 1): Met Week 2:  OT Short Term Goal 1 (Week 2): Pt will complete bathing at edge of bed and supine with min assist. OT Short Term Goal 1 - Progress (Week 2): Met OT Short Term Goal 2 (Week 2):  Pt will complete transfer from w/c to [facility] with steadying assist OT Short Term Goal 2 - Progress (Week 2): Met OT Short Term Goal 3 (Week 2): Pt will don left sock and shoe using AE independently OT Short Term Goal 3 - Progress (Week 2): Partly met OT Short Term Goal 4 (Week 2): Pt will demo ability to complete simple meal prep at w/c level with setup assist OT Short Term Goal 4 - Progress (Week 2): Met OT Short Term Goal 5 (Week 2): Pt will complete UE HEP with supervision OT Short Term Goal 5 - Progress (Week 2): Progressing toward goal Week 3:  OT Short Term Goal 1 (Week 3): STG=LTG due to short remaining LOS  Skilled Therapeutic Interventions/Progress Updates: ADL-retraining (75 min) with focus on improved efficiency with transfers using RW, adapted dressing skills, and car transfer (with friend observing).    Pt received supine in bed, alert and anticipated session with continued excellent motivation.   After setup to cover pic line and stump, pt performed sit>stand from bed and transfer to w/c with min guard assist.    With setup to position w/c in bathroom, pt completed transfer to bench unassisted and bathed, using lateral leans to wash buttocks.    Pt dressed seated in w/c, requiring only min assist to pull up pants while standing supported at RW.    After dressing pt was instructed on initial car transfer technique and with  friend observing transferred from w/c to car with supervision, out of car with mod assist due low car height (simulating girlfriend's [address] .   Therapeutic exercise (15 min) using universal gym.    Pt completed 4 upper body exercises, 10-15 reps, 1 set each, 35-15 lbs, with moderate effort d/t deconditioned status.   Pt advised that 90 min ADL sessions will be terminated after this date d/t his excellent progress with BADL and increased motivation for conditioning in prep for discharge.     Therapy Documentation Precautions:  Precautions Precautions:  Fall Precaution Comments: New Rt BKA, with protector Required Braces or Orthoses: Knee Immobilizer - Right Restrictions Weight Bearing Restrictions: Yes RLE Weight Bearing: Non weight bearing Other Position/Activity Restrictions: new right BKA  Pain: Pain Assessment Pain Assessment: 0-10 Pain Score: 6  Pain Type: Phantom pain Pain Location: Leg Pain Orientation: Right Pain Descriptors / Indicators: Aching Pain Frequency: Intermittent Pain Onset: On-going Patients Stated Pain Goal: 2 Pain Intervention(s): Medication (See eMAR);Repositioned Multiple Pain Sites: No  Exercises: General Exercises - Upper Extremity Shoulder Flexion: Strengthening;Both;15 reps;Seated Shoulder Extension: Strengthening;15 reps;Seated;Both Elbow Flexion: 10 reps;Strengthening;Seated;Both  Other Treatments:   Family/caregiver ed provided to significant other relating to ramp construction and need for details with door widths and patient's ability to access bathroom, tub/shower.  See FIM for current functional status  Therapy/Group: Individual Therapy  Ebbie Cherry 01/13/2014, 10:17 AM

## 2014-01-13 NOTE — Progress Notes (Signed)
Subjective/Complaints:  Pain better.  "i would like to stay an extra week to work on my hops."  Objective: Vital Signs: Blood pressure 117/63, pulse 95, temperature 98.7 F (37.1 C), temperature source Oral, resp. rate 16, weight 160.392 kg (353 lb 9.6 oz), SpO2 98 %. No results found. Results for orders placed or performed during the hospital encounter of 12/26/13 (from the past 72 hour(s))  Glucose, capillary     Status: Abnormal   Collection Time: 01/10/14 12:44 PM  Result Value Ref Range   Glucose-Capillary 153 (H) 70 - 99 mg/dL  Glucose, capillary     Status: Abnormal   Collection Time: 01/10/14  4:23 PM  Result Value Ref Range   Glucose-Capillary 129 (H) 70 - 99 mg/dL  Glucose, capillary     Status: Abnormal   Collection Time: 01/10/14  9:16 PM  Result Value Ref Range   Glucose-Capillary 151 (H) 70 - 99 mg/dL  Protime-INR     Status: Abnormal   Collection Time: 01/11/14  4:57 AM  Result Value Ref Range   Prothrombin Time 23.4 (H) 11.6 - 15.2 seconds   INR 2.06 (H) 0.00 - 1.49  Glucose, capillary     Status: Abnormal   Collection Time: 01/11/14  7:01 AM  Result Value Ref Range   Glucose-Capillary 141 (H) 70 - 99 mg/dL  Glucose, capillary     Status: Abnormal   Collection Time: 01/11/14 11:21 AM  Result Value Ref Range   Glucose-Capillary 185 (H) 70 - 99 mg/dL  Glucose, capillary     Status: Abnormal   Collection Time: 01/11/14  4:48 PM  Result Value Ref Range   Glucose-Capillary 134 (H) 70 - 99 mg/dL  Glucose, capillary     Status: Abnormal   Collection Time: 01/11/14  8:58 PM  Result Value Ref Range   Glucose-Capillary 152 (H) 70 - 99 mg/dL  Protime-INR     Status: Abnormal   Collection Time: 01/12/14  4:51 AM  Result Value Ref Range   Prothrombin Time 19.3 (H) 11.6 - 15.2 seconds   INR 1.61 (H) 0.00 - 1.49  Glucose, capillary     Status: Abnormal   Collection Time: 01/12/14  6:41 AM  Result Value Ref Range   Glucose-Capillary 167 (H) 70 - 99 mg/dL   Glucose, capillary     Status: Abnormal   Collection Time: 01/12/14 11:09 AM  Result Value Ref Range   Glucose-Capillary 166 (H) 70 - 99 mg/dL  Glucose, capillary     Status: Abnormal   Collection Time: 01/12/14  4:27 PM  Result Value Ref Range   Glucose-Capillary 198 (H) 70 - 99 mg/dL  Glucose, capillary     Status: Abnormal   Collection Time: 01/12/14  8:40 PM  Result Value Ref Range   Glucose-Capillary 131 (H) 70 - 99 mg/dL   Comment 1 Notify RN   Protime-INR     Status: Abnormal   Collection Time: 01/13/14  5:10 AM  Result Value Ref Range   Prothrombin Time 17.6 (H) 11.6 - 15.2 seconds   INR 1.43 0.00 - 1.49  Glucose, capillary     Status: Abnormal   Collection Time: 01/13/14  7:15 AM  Result Value Ref Range   Glucose-Capillary 274 (H) 70 - 99 mg/dL     HEENT: normal Cardio: RRR and no murmurs Resp: CTA B/L and unlabored GI: BS positive and nontender nondistended Extremity:  Edema left stump Skin:   Wound clean with minimal drainage from buttock--iodoform. Right  stump with minimal to no drainage. Neuro: Alert/Oriented and Abnormal Motor decreased left knee flexion extension inhibited by pain Musc/Skel:  Right leg appropriately tender. Gen NAD   Assessment/Plan: 1. Functional deficits secondary to Right BKA which require 3+ hours per day of interdisciplinary therapy in a comprehensive inpatient rehab setting. Physiatrist is providing close team supervision and 24 hour management of active medical problems listed below. Physiatrist and rehab team continue to assess barriers to discharge/monitor patient progress toward functional and medical goals. FIM: FIM - Bathing Bathing Steps Patient Completed: Chest, Right Arm, Left Arm, Abdomen, Front perineal area, Buttocks, Right upper leg, Left upper leg, Left lower leg (including foot) Bathing: 6: Assistive device (Comment)  FIM - Upper Body Dressing/Undressing Upper body dressing/undressing steps patient completed:  Thread/unthread right sleeve of pullover shirt/dresss, Thread/unthread left sleeve of pullover shirt/dress, Put head through opening of pull over shirt/dress, Pull shirt over trunk Upper body dressing/undressing: 5: Set-up assist to: Obtain clothing/put away FIM - Lower Body Dressing/Undressing Lower body dressing/undressing steps patient completed: Thread/unthread right pants leg, Thread/unthread left pants leg Lower body dressing/undressing: 4: Min-Patient completed 75 plus % of tasks  FIM - Toileting Toileting steps completed by patient: Performs perineal hygiene, Adjust clothing prior to toileting Toileting Assistive Devices: Grab bar or rail for support Toileting: 3: Mod-Patient completed 2 of 3 steps  FIM - Radio producer Devices: Recruitment consultant Transfers: 0-Activity did not occur  FIM - Control and instrumentation engineer Devices: Bed rails, Adult nurse Transfer: 0: Activity did not occur  FIM - Locomotion: Wheelchair Distance: 150 Locomotion: Wheelchair: 2: Travels 50 - 149 ft with supervision, cueing or coaxing FIM - Locomotion: Ambulation Locomotion: Ambulation Assistive Devices: Administrator Ambulation/Gait Assistance: 4: Min guard, 5: Supervision Locomotion: Ambulation: 1: Travels less than 50 ft with minimal assistance (Pt.>75%)  Comprehension Comprehension Mode: Auditory Comprehension: 6-Follows complex conversation/direction: With extra time/assistive device  Expression Expression Mode: Verbal Expression: 6-Expresses complex ideas: With extra time/assistive device  Social Interaction Social Interaction: 5-Interacts appropriately 90% of the time - Needs monitoring or encouragement for participation or interaction.  Problem Solving Problem Solving: 5-Solves complex 90% of the time/cues < 10% of the time  Memory Memory: 6-More than reasonable amt of time  Medical Problem List and Plan: 1. Functional  deficits secondary to R-BKA, also severely deconditioned  -daily wraps to right stump 2. DVT Prophylaxis/Anticoagulation: Pharmaceutical: Lovenox 3. Pain Management: Premedicate prior to therapy sessions. --  oxycodone prn  -increase gabapentin   4. Mood: team to provide ego support. Added lexapro for mood stabilization. LCSW to follow for evaluation and support.  .  5. Neuropsych: This patient is capable of making decisions on his own behalf. 6. Skin/Wound Care: Daily dressing changes to left buttock wound. Routine pressure relief measures. Monitor wound daily for healing. Maintain adequate nutrition. .   -assured pt that some s/s drainage is to be expected from wound  -afebrile  -ACE wrap with dressing only---switch back to shrinker before dc 7. Fluids/Electrolytes/Nutrition: Monitor I/O. Added protein supplements to help with low protein stores.   -continue megace.    -RD consult 8. Reactive leucocytosis: Resolving . Monitor wound and temperature curve daily. Low grade temp still  9. DM type 2:  stopped lantus---began low dose 70/30 insulin 5u bid---increase to 10u  -intake better    10. CKD: Monitor renal status closely as on IV Vancomycin. Avoid other nephrotoxic drugs.   11. HTN: Monitor BP tid on norvasc. Off prinizide  and lasix due to AKI on CKD. Baseline Cr- 1.78 but is stabilizing to 2.3 range.   -increased coreg to 12.5mg  bid 12. Acute on chronic anemia: Low iron stores with high ferritin levels. Likely poor absorption. Transfused with 2 units on 12/17.  13. Left buttock wound: S/p I & D 12/14--antibiotics complete  14. Resting tachycardia: HR in 110 range. Multifactorial due to obesity  anemia as well as deconditioning. Will monitor for symptoms with activity.  Bb increased  15.  Atelectasis encurage IS      LOS (Days) 18 A FACE TO FACE EVALUATION WAS PERFORMED  SWARTZ,ZACHARY T 01/13/2014, 7:59 AM

## 2014-01-13 NOTE — Progress Notes (Signed)
Recreational Therapy Session Note  Patient Details  Name: Bernardino Mctiernan MRN: [id number] Date of Birth: 04/12/1975 Today's Date: 01/13/2014   Skilled Therapeutic Interventions/Progress Updates: Session focused on meal planning while completing RLE exercises with PT bed level.  Pt Mod I for planning activity.  Pt's girlfriend present & observing.  Therapy/Group: Co-Treatment  Elyzabeth Goatley 01/13/2014, 4:54 PM

## 2014-01-13 NOTE — Plan of Care (Signed)
Problem: RH BOWEL ELIMINATION Goal: RH STG MANAGE BOWEL W/MEDICATION W/ASSISTANCE STG Manage Bowel with Medication with min Assistance.  Outcome: Progressing LBM 01/09/2014

## 2014-01-13 NOTE — Progress Notes (Addendum)
Physical Therapy Session Note  Patient Details  Name: [name] MRN: [id number] Date of Birth: [birth year]  Today's Date: 01/13/2014 PT Individual Time: 1110-1210 and 1500-`530 PT Individual Time Calculation (min): 60 min and 30 min   Short Term Goals: Week 2:  PT Short Term Goal 1 (Week 2): = LTG which have been downgraded due to lack of progress  Skilled Therapeutic Interventions/Progress Updates:   Pt received in w/c ready to work.  Pt requesting to practice simulated car transfer again.  Pt performed w/c mobility x 300' mod I and able to doff/don leg rests and position w/c in correct place for transfer mod I.  Pt performed low simulated car transfer stand pivot with RW and min-mod A to stand from low car but min A to perform actual pivot to/from car.  Recommending that pt and girlfriend perform actual car transfer training on girlfriend's actual car.  Pt reports his girlfriend will be present this pm and tomorrow.  Performed standing balance and RLE strengthening with 15 reps each RLE hip flexion, hip ABD, hip extension and hamstring curls with UE support on RW, min A and intermittent rest breaks due to R limb pain/throbbing; RN alerted for pain medication.  Pt returned to room in w/c and set up with all items within reach.  PM session: pt received in w/c with girlfriend present; pt fatigued and requesting to return to bed but willing to work on gait and then exercises in bed.  Pt performed sit > stand from w/c min A and performed gait x 12' with forwards, lateral and retro stepping with RW and min A.  On bed pt transitioned to prone mod I.  Pt residual limb wrapping noted to be sliding off; re-wrapped R residual limb for edema management and shaping.  While in prone pt performed 10 reps each: quad sets with isometric hold to stretch hamstrings, hamstring curls, hip extension.  During rest breaks discussed and planned functional cooking tasks with RT as well as planned time to perform actual car  transfer with girlfriend and PT tomorrow.  Pt left in prone with girlfriend present and all items within reach.   Therapy Documentation Precautions:  Precautions Precautions: Fall Precaution Comments: New Rt BKA, with protector Required Braces or Orthoses: Knee Immobilizer - Right Restrictions Weight Bearing Restrictions: Yes RLE Weight Bearing: Non weight bearing Other Position/Activity Restrictions: new right BKA Pain: Pain Assessment Pain Assessment: 0-10 Pain Score: 7  Pain Type: Phantom pain Pain Location: Leg Pain Orientation: Right Pain Descriptors / Indicators: Throbbing Pain Frequency: Intermittent Pain Onset: Gradual Patients Stated Pain Goal: 2 Pain Intervention(s): RN made aware Multiple Pain Sites: No Locomotion : Ambulation Ambulation/Gait Assistance: 4: Min guard Wheelchair Mobility Distance: 150   See FIM for current functional status  Therapy/Group: Individual Therapy and co-treat with RT  Raylene Everts [facility] 01/13/2014, 12:53 PM

## 2014-01-13 NOTE — Progress Notes (Signed)
ANTICOAGULATION CONSULT NOTE - Follow Up Consult  Pharmacy Consult for coumadin Indication: hx of DVT/PE  No Known Allergies  Patient Measurements: Weight: (!) 353 lb 9.6 oz (160.392 kg) Heparin Dosing Weight:   Vital Signs: Temp: 98.7 F (37.1 C) (01/05 0459) Temp Source: Oral (01/05 0459) BP: 117/63 mmHg (01/05 0459) Pulse Rate: 95 (01/05 0459)  Labs:  Recent Labs  01/11/14 0457 01/12/14 0451 01/13/14 0510  LABPROT 23.4* 19.3* 17.6*  INR 2.06* 1.61* 1.43    Estimated Creatinine Clearance: 77.3 mL/min (by C-G formula based on Cr of 2.1).   Medications:  Scheduled:  . amLODipine  10 mg Oral Daily  . calcium carbonate  1 tablet Oral TID WC  . carvedilol  12.5 mg Oral BID WC  . escitalopram  20 mg Oral QHS  . feeding supplement (GLUCERNA SHAKE)  237 mL Oral BID BM  . furosemide  20 mg Oral Daily  . gabapentin  200 mg Oral TID  . insulin aspart  0-15 Units Subcutaneous TID WC  . insulin aspart protamine- aspart  10 Units Subcutaneous BID WC  . megestrol  400 mg Oral BID  . multivitamin with minerals  1 tablet Oral Daily  . pantoprazole  40 mg Oral BID  . pravastatin  20 mg Oral q1800  . senna-docusate  2 tablet Oral BID  . sorbitol  45 mL Oral Once  . tamsulosin  0.4 mg Oral QPC supper  . Warfarin - Pharmacist Dosing Inpatient   Does not apply q1800   Infusions:    Assessment: 39 yo male with hx of DVT/PE is currently on subtherapeutic coumadin.  INR today down to 1.43 from 1.61.  Patient's coumadin dosing has been hard to determine since he only required 0.5-1 mg at one point and now seems to require more   Goal of Therapy:  INR 2-3 Monitor platelets by anticoagulation protocol: Yes   Plan:  - coumadin 7.5 mg po x1 - INR in am - If INR continues to go down, will need to consider bridging with lovenox  Theoren Palka, [name] 01/13/2014,8:23 AM

## 2014-01-14 ENCOUNTER — Inpatient Hospital Stay (HOSPITAL_COMMUNITY): Payer: Self-pay | Admitting: *Deleted

## 2014-01-14 ENCOUNTER — Inpatient Hospital Stay (HOSPITAL_COMMUNITY): Payer: Medicaid Other | Admitting: Physical Therapy

## 2014-01-14 ENCOUNTER — Encounter (HOSPITAL_COMMUNITY): Payer: Self-pay

## 2014-01-14 LAB — PROTIME-INR
INR: 1.39 (ref 0.00–1.49)
PROTHROMBIN TIME: 17.2 s — AB (ref 11.6–15.2)

## 2014-01-14 LAB — GLUCOSE, CAPILLARY
GLUCOSE-CAPILLARY: 134 mg/dL — AB (ref 70–99)
GLUCOSE-CAPILLARY: 128 mg/dL — AB (ref 70–99)
GLUCOSE-CAPILLARY: 132 mg/dL — AB (ref 70–99)
Glucose-Capillary: 208 mg/dL — ABNORMAL HIGH (ref 70–99)

## 2014-01-14 MED ORDER — ENOXAPARIN SODIUM 150 MG/ML ~~LOC~~ SOLN
150.0000 mg | Freq: Two times a day (BID) | SUBCUTANEOUS | Status: DC
  Administered 2014-01-14 – 2014-01-18 (×9): 150 mg via SUBCUTANEOUS
  Filled 2014-01-14 (×11): qty 1

## 2014-01-14 MED ORDER — WARFARIN SODIUM 10 MG PO TABS
10.0000 mg | ORAL_TABLET | Freq: Once | ORAL | Status: AC
Start: 2014-01-14 — End: 2014-01-14
  Administered 2014-01-14: 10 mg via ORAL
  Filled 2014-01-14 (×2): qty 1

## 2014-01-14 NOTE — Patient Care Conference (Signed)
Inpatient RehabilitationTeam Conference and Plan of Care Update Date: 01/13/2014   Time: 2:05 PM    Patient Name: [name]      Medical Record Number: [id number]  Date of Birth: [birth year] Sex: Male         Room/Bed: 4W08C/4W08C-01 Payor Info: Payor: MEDICAID POTENTIAL / Plan: MEDICAID POTENTIAL / Product Type: *No Product type* /    Admitting Diagnosis: narendra r bka  Admit Date/Time:  12/26/2013  5:38 PM Admission Comments: No comment available   Primary Diagnosis:  Amputation of right lower extremity below knee Principal Problem: Amputation of right lower extremity below knee  Patient Active Problem List   Diagnosis Date Noted  . Constipation   . Amputation of right lower extremity below knee 12/26/2013  . Diabetic ulcer of right foot   . Cellulitis and abscess   . Abscess of right hip 12/20/2013  . Cellulitis and abscess of right foot 12/12/2013  . GERD (gastroesophageal reflux disease) 08/14/2013  . Hyperlipidemia 02/19/2013  . S/P cardiac cath, 02/18/13, normal coronaries 02/19/2013  . CKD stage 3 due to type 1 diabetes mellitus 02/19/2013  . Failure to attend appointment 06/10/2012  . Nephrotic syndrome 02/12/2012  . Essential hypertension 07/24/2011  . Acute venous embolism and thrombosis of deep vessels of proximal lower extremity 07/19/2011  . Long term (current) use of anticoagulants 07/19/2011  . Foot ulcer, right 07/12/2011  . Back pain 07/12/2011  . DEEP VENOUS THROMBOPHLEBITIS, HX OF 07/28/2009  . PROTEINURIA, MILD 02/01/2007  . Morbid obesity 10/26/2005  . DM (diabetes mellitus) type I uncontrolled, periph vascular disorder 01/09/2002    Expected Discharge Date: Expected Discharge Date: 01/20/14  Team Members Present: Physician leading conference: Dr. Alger Simons Social Worker Present: Lennart Pall, LCSW Nurse Present: Elliot Cousin, RN PT Present: Canary Brim, PT OT Present: [name], OT;Jennifer Tamala Julian, OT;Patricia Lissa Hoard, OT SLP Present:  Weston Anna, SLP PPS Coordinator present : Daiva Nakayama, RN, CRRN     Current Status/Progress Goal Weekly Team Focus  Medical   wounds healing. new urine retention  pain control. improved insight and initation  pain mgt, stump mgt. wound care   Bowel/Bladder   Pt continent of bowel-scheduled stool softeners. last BM 1-1 with pt refusing laxative. Pt voids large amount of urine at times q8-10 hrs. When unable to void pt I&O cath q 8rs. Unable to void at times.   manage b/b Min assist-  encourage double void. Cont. to encourage and educate pt on stool softerns, laxatives, and regular BMs.    Swallow/Nutrition/ Hydration             ADL's   Supervision - Min A for STAND-PIVOT transfer using RW, setup for bathing and upper body dressing, min assist for lower body dressing.  Mod I for ADL (B&D and toileting) at EOB and w/c level, supervision for tub transfers  Static/dynamic standing balance, transfers using RW, DME/AE training, discharge planning   Mobility   Overall SBA for bed mobility, mod (I) for w/c propulsion w/ extra time, MinGuard for ambulation but requires +2 for w/c follow due to decreased endurance (can ambulate up to 15')  mod (I) bed mobility, transfers, w/c propulsion, MinA car transfers, supervision for all ambulation  upright mobility, arm strengthening   Communication             Safety/Cognition/ Behavioral Observations            Pain   Pain to right stump. 15 oxy prn q4hrs, robaxin 500mg  prn  q6 hrs.   5 or less  medicate as needed and prior to therapy   Skin   old abcess site to right lower back-packed with iodofrom daily with foam to cover. skin tears to right stump-foam inplace. Stump has staples CDI with shrinker and guard in place  prevent infection and dressing care with max assist; total for abcess site  cont. plan of care with dressing changes. assess skin and monitor for s/s of infection    Rehab Goals Patient on target to meet rehab goals: Yes *See Care  Plan and progress notes for long and short-term goals.  Barriers to Discharge: urine retention, wound care    Possible Resolutions to Barriers:  improved initiation. pt and family education    Discharge Planning/Teaching Needs:  Pt confirms plans to d/c to girlfriend's home.  Plans for ramp to be constructed on Friday 1/8.    ongoing   Team Discussion:  Wound healing well.  Cognition, motivation and initiation all very much improved this week.  D/c limb guard as not fititng properly.  Still needing i/o caths at times - MD aware.  Still packing wound but much improved.  Team feels would benefit from extension of stay given gains this week.  Revisions to Treatment Plan:  Several goals upgraded.  Extended stay to next week.   Continued Need for Acute Rehabilitation Level of Care: The patient requires daily medical management by a physician with specialized training in physical medicine and rehabilitation for the following conditions: Daily direction of a multidisciplinary physical rehabilitation program to ensure safe treatment while eliciting the highest outcome that is of practical value to the patient.: Yes Daily medical management of patient stability for increased activity during participation in an intensive rehabilitation regime.: Yes Daily analysis of laboratory values and/or radiology reports with any subsequent need for medication adjustment of medical intervention for : Post surgical problems;Neurological problems;Other  [name] 01/14/2014, 10:22 AM

## 2014-01-14 NOTE — Progress Notes (Signed)
   01/14/14 1600  Clinical Encounter Type  Visited With Patient;Other (Comment) (Patient's girlfriend)  Visit Type Follow-up;Spiritual support;Social support  Stress Factors  Patient Stress Factors Major life changes   Chaplain saw patient in the [facility] today and followed up by visiting patient later in the afternoon. Patient was in a positive mood today and described himself as literally a different person than he was immediately following his amputation. Patient feels he has excelled in the rehab facility and would not be as positive had he gone directly home. Patient was really enthusiastic and expressed hopes in being able to play catch with his nephews again. Patient also explained that he saw the chaplain today because he had just come from outside where he was practicing getting in and out of the car. Chaplain hopes to follow up with patient before discharge. Jader Desai, [name], Chaplain  4:18 PM

## 2014-01-14 NOTE — Progress Notes (Signed)
Physical Therapy Weekly Progress Note  Patient Details  Name: [name] MRN: [id number] Date of Birth: 1975-12-26  Beginning of progress report period: January 04, 2014 End of progress report period: January 14, 2013  Today's Date: 01/14/2014 PT Individual Time: 0830-0930 Treatment Session 2: 1300-1400 PT Individual Time Calculation (min): 60 min  Treatment Session 2: 60 min  Patient has met 5 of 10 short term goals (which were the same as long term goals, due to pt's anticipated short length of stay, which has now been extended).  Pt continues to require assist at times with transfers due to quickly fatigueing and low activity tolerance. Pt's ambulation goals have been reactivated due to his progress, but he continues to require hands on with ambulation for safety. Pt also req significant assist with car transfer.   Patient continues to demonstrate the following deficits: difficulty with transfers and ambulation, impaired standing balance, low activity tolerance, difficulty compensating for new R BKA deficit, generalized deconditioning and therefore will continue to benefit from skilled PT intervention to enhance overall performance with activity tolerance, balance, ability to compensate for deficits and coordination.  Patient progressing toward long term goals..  Continue plan of care.  PT Short Term Goals Week 2:  PT Short Term Goal 1 (Week 2): see long term goals for the 5/10 met Week 3:  PT Short Term Goal 1 (Week 3): STGs = LTGs due to ELOS  Skilled Therapeutic Interventions/Progress Updates:    Therapeutic Activity: Pt demonstrates mod I bed mobility supine to/from sit from flat bed without rail.  Pt dons pants with set up assist while sitting edge of bed and while supine in bed.  PT instructs pt in sit to stand from edge of bed req min A to block L knee and in stand-pivot transfer req CGA for safety with bari RW from bed to w/c. Once in stand, pt req min A and verbal cues to  weight shift L over base of support to obtain good standing balance with RW.  PT instructs pt in car transfer with bari RW req CGA to transfer in the car and out of the car req min A at blocking the L knee for stability with pt pushing off of backrest with R hand and RW with L hand.   W/C Management: PT instructs pt in w/c propulsion with B UEs in a controlled environment x 250' mod I req increased time.   Therapeutic Exercise: PT instructs pt in standing ROM and strengthening exercises to R residual limb: hip flexion, hip abduction, hip extension 2 x 15 reps each  Treatment Session 2: W/C Management: PT instructs pt in w/c propulsion with B UEs mod I on level and Supervision on uneven surfaces x 400' total. Pt self propels over elevator thresholds, pressing the elevator button, over variable floor surfaces: tile, carpet, and cement, and up/down a slight ramp out the automatic doors of the hospital.   Therapeutic Activity: PT instructs pt in his girlfriend's car transfer req min A at first to stand-pivot from w/c to front passenger seat of car, but pt's foot becomes stuck on the cement and he req max A for an assisted sit into the car. Back of car seat had been reclined maximally and seat position had been pushed posterior maximally. Pt demonstrates the ability to get both legs in/out of the car without assist, but pt's dressing and ace bandage on R residual limb fell off. PT re-wrapped pt's R residual limb and pt reports comfort after  the wrapping. PT instructs pt in scoot transfer with extra-long slideboard from car to w/c, uphill, req min-mod A x  1 with verbal cues for foot placement, hand placement, and trunk direction lean and another assist x 1 to stabilize the w/c that pt was scooting into.  PT provides supervision and partial set-up assist for pt to complete a cooking prep activity in the kitchen from a w/c level - chopping chicken at the table, preparing slaw marinade on the stovetop -  including maneuvering in/out of the fridge, to/from the table, and around the stove top with occasional set up/transportation assist of items from girlfriend [name]. .   After car transfer, PT spoke with pt and girlfriend [name] and each agreed that pt needs more training with car transfer technique. [name]'s car is very low to the ground, ground to seat height is 18 inches. Due to pt's body habitus/large size, it was difficult for his bottom to fit on the slideboard and not slide forward off of extra long slideboard during car to w/c scoot transfer. PT suggested trialing anterior-posterior scoot transfer during practice in the gym, as well as continuing with sit to stand training from progressively lower heights (up to 18 inches), and continued lateral scoot transfers with slideboard. Pt may do well scooting into the backseat of the car instead of the front seat. Continue per PT POC.    Therapy Documentation Precautions:  Precautions Precautions: Fall Precaution Comments: New Rt BKA, with protector Required Braces or Orthoses: Knee Immobilizer - Right Restrictions Weight Bearing Restrictions: Yes RLE Weight Bearing: Non weight bearing Other Position/Activity Restrictions: new right BKA Pain: Pain Assessment Pain Assessment: 0-10 Pain Score: 9  Pain Type: Phantom pain Pain Location: Foot Pain Orientation: Right Pain Descriptors / Indicators: Aching;Burning Pain Onset: On-going Pain Intervention(s): Emotional support;Rest;Repositioned Multiple Pain Sites: No Treatment Session 2: Pt c/o 5/10 phantom pain in R LE and PT uses emotional support to decrease pt's pain.   See FIM for current functional status  Therapy/Group: Individual Therapy  [name] M 01/14/2014, 8:35 AM

## 2014-01-14 NOTE — Progress Notes (Addendum)
ANTICOAGULATION CONSULT NOTE - Follow Up Consult  Pharmacy Consult for coumadin and lovenox Indication: hx of DVT/PE  No Known Allergies  Patient Measurements: Weight: (!) 349 lb 13.9 oz (158.7 kg) Heparin Dosing Weight:   Vital Signs: Temp: 99.3 F (37.4 C) (01/06 0650) Temp Source: Oral (01/06 0650) BP: 121/58 mmHg (01/06 0650) Pulse Rate: 91 (01/06 0650)  Labs:  Recent Labs  01/12/14 0451 01/13/14 0510 01/14/14 0605  LABPROT 19.3* 17.6* 17.2*  INR 1.61* 1.43 1.39    Estimated Creatinine Clearance: 76.8 mL/min (by C-G formula based on Cr of 2.1).   Medications:  Scheduled:  . amLODipine  10 mg Oral Daily  . calcium carbonate  1 tablet Oral TID WC  . carvedilol  12.5 mg Oral BID WC  . escitalopram  20 mg Oral QHS  . feeding supplement (GLUCERNA SHAKE)  237 mL Oral BID BM  . furosemide  20 mg Oral Daily  . gabapentin  200 mg Oral TID  . insulin aspart  0-15 Units Subcutaneous TID WC  . insulin aspart protamine- aspart  10 Units Subcutaneous BID WC  . megestrol  400 mg Oral BID  . multivitamin with minerals  1 tablet Oral Daily  . pantoprazole  40 mg Oral BID  . pravastatin  20 mg Oral q1800  . senna-docusate  2 tablet Oral BID  . sorbitol  45 mL Oral Once  . tamsulosin  0.4 mg Oral QPC supper  . Warfarin - Pharmacist Dosing Inpatient   Does not apply q1800   Infusions:    Assessment: 39 yo male with hx of DVT/PE is currently on subtherapeutic coumadin.  INR today is down a little to 1.39 from 1.41.  Team is ok to bridge with full dose lovenox in the meantime until INR is back up to >/= 2.  CrCl ~77  Goal of Therapy:  INR 2-3 Monitor platelets by anticoagulation protocol: Yes   Plan:  - Coumadin 10 mg po today and lovenox 150 mg sq q12h. - Cont daily INR and CBC every 72 hours   Leimomi Zervas, [name] 01/14/2014,8:15 AM

## 2014-01-14 NOTE — Progress Notes (Signed)
Subjective/Complaints: No new complaints.   Objective: Vital Signs: Blood pressure 121/58, pulse 91, temperature 99.3 F (37.4 C), temperature source Oral, resp. rate 18, weight 158.7 kg (349 lb 13.9 oz), SpO2 96 %. No results found. Results for orders placed or performed during the hospital encounter of 12/26/13 (from the past 72 hour(s))  Glucose, capillary     Status: Abnormal   Collection Time: 01/11/14 11:21 AM  Result Value Ref Range   Glucose-Capillary 185 (H) 70 - 99 mg/dL  Glucose, capillary     Status: Abnormal   Collection Time: 01/11/14  4:48 PM  Result Value Ref Range   Glucose-Capillary 134 (H) 70 - 99 mg/dL  Glucose, capillary     Status: Abnormal   Collection Time: 01/11/14  8:58 PM  Result Value Ref Range   Glucose-Capillary 152 (H) 70 - 99 mg/dL  Protime-INR     Status: Abnormal   Collection Time: 01/12/14  4:51 AM  Result Value Ref Range   Prothrombin Time 19.3 (H) 11.6 - 15.2 seconds   INR 1.61 (H) 0.00 - 1.49  Glucose, capillary     Status: Abnormal   Collection Time: 01/12/14  6:41 AM  Result Value Ref Range   Glucose-Capillary 167 (H) 70 - 99 mg/dL  Glucose, capillary     Status: Abnormal   Collection Time: 01/12/14 11:09 AM  Result Value Ref Range   Glucose-Capillary 166 (H) 70 - 99 mg/dL  Glucose, capillary     Status: Abnormal   Collection Time: 01/12/14  4:27 PM  Result Value Ref Range   Glucose-Capillary 198 (H) 70 - 99 mg/dL  Glucose, capillary     Status: Abnormal   Collection Time: 01/12/14  8:40 PM  Result Value Ref Range   Glucose-Capillary 131 (H) 70 - 99 mg/dL   Comment 1 Notify RN   Protime-INR     Status: Abnormal   Collection Time: 01/13/14  5:10 AM  Result Value Ref Range   Prothrombin Time 17.6 (H) 11.6 - 15.2 seconds   INR 1.43 0.00 - 1.49  Glucose, capillary     Status: Abnormal   Collection Time: 01/13/14  7:15 AM  Result Value Ref Range   Glucose-Capillary 274 (H) 70 - 99 mg/dL  Glucose, capillary     Status: Abnormal    Collection Time: 01/13/14 12:08 PM  Result Value Ref Range   Glucose-Capillary 100 (H) 70 - 99 mg/dL  Urinalysis, Routine w reflex microscopic     Status: Abnormal   Collection Time: 01/13/14  4:14 PM  Result Value Ref Range   Color, Urine YELLOW YELLOW   APPearance CLOUDY (A) CLEAR   Specific Gravity, Urine 1.016 1.005 - 1.030   pH 5.0 5.0 - 8.0   Glucose, UA 250 (A) NEGATIVE mg/dL   Hgb urine dipstick SMALL (A) NEGATIVE   Bilirubin Urine NEGATIVE NEGATIVE   Ketones, ur NEGATIVE NEGATIVE mg/dL   Protein, ur 100 (A) NEGATIVE mg/dL   Urobilinogen, UA 0.2 0.0 - 1.0 mg/dL   Nitrite NEGATIVE NEGATIVE   Leukocytes, UA NEGATIVE NEGATIVE  Urine microscopic-add on     Status: Abnormal   Collection Time: 01/13/14  4:14 PM  Result Value Ref Range   Squamous Epithelial / LPF RARE RARE   WBC, UA 0-2 <3 WBC/hpf   RBC / HPF 0-2 <3 RBC/hpf   Bacteria, UA MANY (A) RARE   Casts HYALINE CASTS (A) NEGATIVE  Glucose, capillary     Status: Abnormal   Collection Time:  01/13/14  4:30 PM  Result Value Ref Range   Glucose-Capillary 139 (H) 70 - 99 mg/dL   Comment 1 Notify RN   Glucose, capillary     Status: Abnormal   Collection Time: 01/13/14  8:55 PM  Result Value Ref Range   Glucose-Capillary 171 (H) 70 - 99 mg/dL   Comment 1 Notify RN   Protime-INR     Status: Abnormal   Collection Time: 01/14/14  6:05 AM  Result Value Ref Range   Prothrombin Time 17.2 (H) 11.6 - 15.2 seconds   INR 1.39 0.00 - 1.49  Glucose, capillary     Status: Abnormal   Collection Time: 01/14/14  6:52 AM  Result Value Ref Range   Glucose-Capillary 208 (H) 70 - 99 mg/dL   Comment 1 Notify RN      HEENT: normal Cardio: RRR and no murmurs Resp: CTA B/L and unlabored GI: BS positive and nontender nondistended Extremity:  Edema left stump Skin:   Wound clean with minimal drainage from buttock--iodoform. Right stump with minimal to no drainage. Neuro: Alert/Oriented and Abnormal Motor decreased left knee flexion  extension inhibited by pain Musc/Skel:  Right leg appropriately tender. Gen NAD   Assessment/Plan: 1. Functional deficits secondary to Right BKA which require 3+ hours per day of interdisciplinary therapy in a comprehensive inpatient rehab setting. Physiatrist is providing close team supervision and 24 hour management of active medical problems listed below. Physiatrist and rehab team continue to assess barriers to discharge/monitor patient progress toward functional and medical goals. FIM: FIM - Bathing Bathing Steps Patient Completed: Chest, Right Arm, Left Arm, Abdomen, Front perineal area, Buttocks, Right upper leg, Left upper leg, Left lower leg (including foot) Bathing: 6: Assistive device (Comment)  FIM - Upper Body Dressing/Undressing Upper body dressing/undressing steps patient completed: Thread/unthread right sleeve of pullover shirt/dresss, Thread/unthread left sleeve of pullover shirt/dress, Put head through opening of pull over shirt/dress, Pull shirt over trunk Upper body dressing/undressing: 7: Complete Independence: No helper FIM - Lower Body Dressing/Undressing Lower body dressing/undressing steps patient completed: Thread/unthread right pants leg, Thread/unthread left pants leg, Don/Doff left sock Lower body dressing/undressing: 4: Min-Patient completed 75 plus % of tasks  FIM - Toileting Toileting steps completed by patient: Adjust clothing prior to toileting, Adjust clothing after toileting, Performs perineal hygiene Toileting Assistive Devices: Grab bar or rail for support Toileting: 6: More than reasonable amount of time  FIM - Radio producer Devices: Grab bars Toilet Transfers: 6-To toilet/ BSC, 6-From toilet/BSC  FIM - Control and instrumentation engineer Devices: Bed rails, Adult nurse Transfer: 4: Chair or W/C > Bed: Min A (steadying Pt. > 75%), 4: Bed > Chair or W/C: Min A (steadying Pt. > 75%)  FIM -  Locomotion: Wheelchair Distance: 150 Locomotion: Wheelchair: 6: Travels 150 ft or more, turns around, maneuvers to table, bed or toilet, negotiates 3% grade: maneuvers on rugs and over door sills independently FIM - Locomotion: Ambulation Locomotion: Ambulation Assistive Devices: Administrator Ambulation/Gait Assistance: 4: Min guard Locomotion: Ambulation: 1: Travels less than 50 ft with minimal assistance (Pt.>75%)  Comprehension Comprehension Mode: Auditory Comprehension: 6-Follows complex conversation/direction: With extra time/assistive device  Expression Expression Mode: Verbal Expression: 6-Expresses complex ideas: With extra time/assistive device  Social Interaction Social Interaction: 5-Interacts appropriately 90% of the time - Needs monitoring or encouragement for participation or interaction.  Problem Solving Problem Solving: 6-Solves complex problems: With extra time  Memory Memory: 6-More than reasonable amt of time  Medical Problem  List and Plan: 1. Functional deficits secondary to R-BKA, also severely deconditioned  -daily wraps to right stump 2. DVT Prophylaxis/Anticoagulation: Pharmaceutical: Lovenox 3. Pain Management: Premedicate prior to therapy sessions. --  oxycodone prn  -increase gabapentin   4. Mood: team to provide ego support. Added lexapro for mood stabilization. LCSW to follow for evaluation and support.  .  5. Neuropsych: This patient is capable of making decisions on his own behalf. 6. Skin/Wound Care: Daily dressing changes to left buttock wound. Routine pressure relief measures. Monitor wound daily for healing. Maintain adequate nutrition. .   -assured pt that some s/s drainage is to be expected from wound  -afebrile  -ACE wrap with dressing only---switch back to shrinker before dc 7. Fluids/Electrolytes/Nutrition: Monitor I/O. Added protein supplements to help with low protein stores.   -dc megace.    -RD consult 8. Reactive  leucocytosis: Resolving . Monitor wound and temperature curve daily. Low grade temp still  9. DM type 2:  stopped lantus---began low dose 70/30 insulin 5u bid---increase to 10u  -probably will need further titration  -intake improved    10. CKD: Monitor renal status closely as on IV Vancomycin. Avoid other nephrotoxic drugs.   11. HTN: Monitor BP tid on norvasc. Off prinizide and lasix due to AKI on CKD. Baseline Cr- 1.78 but is stabilizing to 2.3 range.   -increased coreg to 12.5mg  bid 12. Acute on chronic anemia: Low iron stores with high ferritin levels. Likely poor absorption. Transfused with 2 units on 12/17.  13. Left buttock wound: S/p I & D 12/14--antibiotics complete  14. Resting tachycardia: bb increased  -improved HR       LOS (Days) 19 A FACE TO FACE EVALUATION WAS PERFORMED  SWARTZ,ZACHARY T 01/14/2014, 8:25 AM

## 2014-01-14 NOTE — Progress Notes (Signed)
Social Work Patient ID: [name], male   DOB: 1975-09-04, 39 y.o.   MRN: [id number]   Met with pt and girlfriend yesterday afternoon to review team conference info.  Both agree and pleased with team report that many gains made this week in therapies.  Also aware and agreeable with extension of stay with new target d/c date of 1/12.  Girlfriend reports ramp is to be put in place this Friday.  Both very pleased and pt with brighter affect.  He reports that he also had a good visit with [facility] with the local [facility] and felt that was very beneficial in terms of information and support.  Very interested in attending the support group upon d/c.    Both  aware that, under Medicaid (pt is pending Medicaid), there are no covered therapy services at home.  Our tx will provide a home exercise program.  We will be able to arrange [facility] visits to continue to monitor incision and wound (still will small amount of packing required.)  Will continue to follow for support and d/c planning.  Kavitha Lansdale, LCSW

## 2014-01-14 NOTE — Progress Notes (Signed)
Educated and demonstrated to wife stump care and wrapping his stump. Also demonstrated and educated  throughly the dressing change to posterior lower right back.  Packed with iodoform and foam placed. Needs further education with dressing changes and skin care.

## 2014-01-14 NOTE — Progress Notes (Signed)
Patient states PRN pain medication oxy IR is "not working anymore." Patient received PRN pain medication tramadol which decreased pain from 8 to 4 on 0-10 pain scale.  Patient questioning what pain medication he will be discharged on since he is not finding relief and if it needs to be changed.   Patient's mood and demeanor are better than previous shift assignment assessment. Patient appears motivated to be here and work with therapy, hold discussion with staff and open to working hard.

## 2014-01-14 NOTE — Progress Notes (Signed)
Occupational Therapy Session Note  Patient Details  Name: [name] MRN: [id number] Date of Birth: Jul 19, 1975  Today's Date: 01/14/2014 OT Individual Time:  -       Short Term Goals: Week 3:  OT Short Term Goal 1 (Week 3): STG=LTG due to short remaining LOS  Skilled Therapeutic Interventions/Progress Updates:    Pt engaged in BADL retraining including bathing at shower level and dressing with sit<>stand from w/c.  Pt performed transfers with mod I.  Pt required steady A when standing with RW to pull up pants.  Pt continues to require extra time to complete tasks but was able to complete BADLs within 60 mins.  Pt directs care/assistance appropriately.  Focus on activity tolerance, sit<>stand, standing balance, transfers, and safety awareness.  Therapy Documentation Precautions:  Precautions Precautions: Fall Precaution Comments: New Rt BKA, with protector Required Braces or Orthoses: Knee Immobilizer - Right Restrictions Weight Bearing Restrictions: Yes RLE Weight Bearing: Non weight bearing Other Position/Activity Restrictions: new right BKA  Pain: Pain Assessment Pain Assessment: 0-10 Pain Score: 9  Pain Type: Phantom pain Pain Location: Foot Pain Orientation: Right Pain Descriptors / Indicators: Aching;Burning Pain Onset: On-going Pain Intervention(s): Emotional support;Rest;Repositioned Multiple Pain Sites: No  See FIM for current functional status  Therapy/Group: Individual Therapy  [name] 01/14/2014, 11:02 AM

## 2014-01-15 ENCOUNTER — Inpatient Hospital Stay (HOSPITAL_COMMUNITY): Payer: Medicaid Other | Admitting: Physical Therapy

## 2014-01-15 ENCOUNTER — Inpatient Hospital Stay (HOSPITAL_COMMUNITY): Payer: Medicaid Other

## 2014-01-15 DIAGNOSIS — D62 Acute posthemorrhagic anemia: Secondary | ICD-10-CM

## 2014-01-15 LAB — URINE CULTURE
COLONY COUNT: NO GROWTH
Culture: NO GROWTH

## 2014-01-15 LAB — CBC
HCT: 21.2 % — ABNORMAL LOW (ref 39.0–52.0)
HEMOGLOBIN: 6.9 g/dL — AB (ref 13.0–17.0)
MCH: 27.7 pg (ref 26.0–34.0)
MCHC: 32.5 g/dL (ref 30.0–36.0)
MCV: 85.1 fL (ref 78.0–100.0)
Platelets: 319 10*3/uL (ref 150–400)
RBC: 2.49 MIL/uL — ABNORMAL LOW (ref 4.22–5.81)
RDW: 14.2 % (ref 11.5–15.5)
WBC: 6.9 10*3/uL (ref 4.0–10.5)

## 2014-01-15 LAB — PROTIME-INR
INR: 1.64 — ABNORMAL HIGH (ref 0.00–1.49)
PROTHROMBIN TIME: 19.5 s — AB (ref 11.6–15.2)

## 2014-01-15 LAB — GLUCOSE, CAPILLARY
GLUCOSE-CAPILLARY: 147 mg/dL — AB (ref 70–99)
Glucose-Capillary: 191 mg/dL — ABNORMAL HIGH (ref 70–99)
Glucose-Capillary: 187 mg/dL — ABNORMAL HIGH (ref 70–99)
Glucose-Capillary: 148 mg/dL — ABNORMAL HIGH (ref 70–99)

## 2014-01-15 LAB — PREPARE RBC (CROSSMATCH)

## 2014-01-15 MED ORDER — DIPHENHYDRAMINE HCL 25 MG PO CAPS
25.0000 mg | ORAL_CAPSULE | Freq: Once | ORAL | Status: AC
  Administered 2014-01-15: 25 mg via ORAL
  Filled 2014-01-15: qty 1

## 2014-01-15 MED ORDER — ACETAMINOPHEN 325 MG PO TABS
650.0000 mg | ORAL_TABLET | Freq: Once | ORAL | Status: AC
  Administered 2014-01-15: 650 mg via ORAL
  Filled 2014-01-15: qty 2

## 2014-01-15 MED ORDER — SODIUM CHLORIDE 0.9 % IV SOLN
Freq: Once | INTRAVENOUS | Status: AC
  Administered 2014-01-15: 19:00:00 via INTRAVENOUS

## 2014-01-15 MED ORDER — DIPHENHYDRAMINE HCL 25 MG PO CAPS
25.0000 mg | ORAL_CAPSULE | ORAL | Status: AC
  Administered 2014-01-15: 25 mg via ORAL
  Filled 2014-01-15: qty 1

## 2014-01-15 MED ORDER — WARFARIN SODIUM 10 MG PO TABS
10.0000 mg | ORAL_TABLET | Freq: Once | ORAL | Status: AC
  Administered 2014-01-15: 10 mg via ORAL
  Filled 2014-01-15: qty 1

## 2014-01-15 NOTE — Progress Notes (Signed)
Physical Therapy Session Note  Patient Details  Name: [name] MRN: [id number] Date of Birth: [birth year]  Today's Date: 01/15/2014 PT Individual Time: 0830-0930 PT Individual Time Calculation (min): 60 min   Short Term Goals: Week 3:  PT Short Term Goal 1 (Week 3): STGs = LTGs due to ELOS  Skilled Therapeutic Interventions/Progress Updates:    Therapeutic Activity: Pt handed off from OT session fully dressed except for underwear/shorts being pulled over bottom. PT instructs pt in chair push-ups req verbal cues for safety and pt completes multiple chair push-ups while PT pulls underwear/shorts over bottom.  PT instructs pt in stand-pivot/hop transfer w/c to mat in lowest position (20.5" floor to seat height) req CGA for safety and in slideboard transfer from low mat to w/c req SBA for stabilization of w/c, only. Pt then req multiple attempts to perform sit to stand, again, due to low endurance/fatigue, but is able to do a second rep of stand-pivot transfer from w/c to low mat with bari RW req min A and slideboard scoot from low mat to w/c req SBA and w/c stabilization for safety.  PT instructs pt in AP scoot transfer from w/c to/from mat req CGA-min A for safety (w/c stabilized) and maintaining shorts over bottom, req verbal cues for technique.   W/C Management: PT instructs pt in w/c propulsion x 150' x 2 mod I on level surface req increased time in a controlled environment.   PT notes that pt's full thickness, tunneling buttock wound is on the inferior-lateral aspect of the R bottom cheek. Pt continues to fatigue very quickly with transfers and is high falls risk. Pt may benefit from AP car transfers, but this needs to be trialed with an actual car. Pt reports brother has a Physicist, medical and PT asks pt to schedule a time to trial car transfer in/out of brother's car. Continue per PT POC.   Therapy Documentation Precautions:  Precautions Precautions: Fall Precaution Comments: New Rt BKA,  with protector Required Braces or Orthoses: Knee Immobilizer - Right Restrictions Weight Bearing Restrictions: Yes RLE Weight Bearing: Non weight bearing Other Position/Activity Restrictions: new right BKA Pain: Pain Assessment Pain Assessment: 0-10 Pain Score: 4  Pain Type: Phantom pain Pain Location: Foot Pain Orientation: Right Pain Descriptors / Indicators: Aching;Burning Pain Onset: On-going Pain Intervention(s): Rest;Distraction Multiple Pain Sites: No  See FIM for current functional status  Therapy/Group: Individual Therapy  [name] M 01/15/2014, 8:41 AM

## 2014-01-15 NOTE — Progress Notes (Signed)
NUTRITION FOLLOW UP  DOCUMENTATION CODES Per approved criteria  -Morbid Obesity   INTERVENTION: Continue Glucerna Shake po BID daily, each supplement provides 220 kcal and 10 grams of protein.  Continue Multivitamin with minerals daily  Encourage adequate PO intake.  NUTRITION DIAGNOSIS: Inadequate oral intake related to nausea as evidenced by meal completion of 10-50%; improving  Goal: Pt to meet >/= 90% of their estimated nutrition needs; met  Monitor:  PO intake, weight trends, labs, I/O's  39 y.o. male  Admitting Dx: Amputation of right lower extremity below knee  ASSESSMENT: Pt with history of DM type 2 with diabetic foot ulcer, morbid obesity, HTN, CKD, on chronic coumadin for recurrent DVT/PE, medical non-compliance; who was admitted via MD office on 12/02 with fever chills, N/V and right foot pain. He underwent R-BKA with I and D right buttock abscess on 12/22/13.  Pt reports his appetite has been improving. Meal completion has been 75-100%. Pt reports he has been drinking his Ayden only sometimes when he is not eating well, since meal completion improved, he has not been drinking them. RD to continue with current orders to ensure that pt still has access to the supplements when needed. Pt was encouraged to eat his food at meals.   Labs and medication reviewed.  Height: Ht Readings from Last 1 Encounters:  12/10/13 6' 2"  (1.88 m)    Weight: Wt Readings from Last 1 Encounters:  01/15/14 352 lb 4.7 oz (159.8 kg)  01/01/14 373 lb  BMI:  Body mass index is 45.21 kg/(m^2). Morbid obesity  Re-Estimated Nutritional Needs: Kcal: 2300-2600 Protein: 130-150 grams Fluid: 2.4 - 2.6 L/day  Skin: +2 RLE, +2 LLE edema, incision right hip, incision right leg  Diet Order: Diet Carb Modified   Intake/Output Summary (Last 24 hours) at 01/15/14 0959 Last data filed at 01/15/14 0950  Gross per 24 hour  Intake    960 ml  Output   2500 ml  Net  -1540 ml     Last BM: 1/6  Labs:  No results for input(s): NA, K, CL, CO2, BUN, CREATININE, CALCIUM, MG, PHOS, GLUCOSE in the last 168 hours.  CBG (last 3)   Recent Labs  01/14/14 1648 01/14/14 2051 01/15/14 0656  GLUCAP 128* 132* 191*    Scheduled Meds: . sodium chloride   Intravenous Once  . acetaminophen  650 mg Oral Once  . amLODipine  10 mg Oral Daily  . calcium carbonate  1 tablet Oral TID WC  . carvedilol  12.5 mg Oral BID WC  . diphenhydrAMINE  25 mg Oral Once  . enoxaparin (LOVENOX) injection  150 mg Subcutaneous Q12H  . escitalopram  20 mg Oral QHS  . feeding supplement (GLUCERNA SHAKE)  237 mL Oral BID BM  . furosemide  20 mg Oral Daily  . gabapentin  200 mg Oral TID  . insulin aspart  0-15 Units Subcutaneous TID WC  . insulin aspart protamine- aspart  10 Units Subcutaneous BID WC  . multivitamin with minerals  1 tablet Oral Daily  . pantoprazole  40 mg Oral BID  . pravastatin  20 mg Oral q1800  . senna-docusate  2 tablet Oral BID  . sorbitol  45 mL Oral Once  . tamsulosin  0.4 mg Oral QPC supper  . warfarin  10 mg Oral ONCE-1800  . Warfarin - Pharmacist Dosing Inpatient   Does not apply q1800    Continuous Infusions:   Past Medical History  Diagnosis Date  . DVT (  deep venous thrombosis) 09/2002    patient reports additional DVTs in '06 & '11 (unconfirmed)  . Pulmonary embolism 09/2002    treated with 6 months of warfarin  . Hypertension   . Chest pain, neg MI, normal coronaries by cath 02/18/2013  . Hyperlipidemia 02/19/2013  . Acute venous embolism and thrombosis of deep vessels of proximal lower extremity 07/19/2011  . Type I diabetes mellitus dx'd 2001  . GERD (gastroesophageal reflux disease)   . Diabetic ulcer of right foot   . CKD (chronic kidney disease) stage 3, GFR 30-59 ml/min 02/19/2013  . Nephrotic syndrome     Past Surgical History  Procedure Laterality Date  . Incision and drainage abscess  2007; 2015    "back"  . Cardiac catheterization   02/18/2013    normal coronaries  . I&d extremity Right 12/14/2013    Procedure: IRRIGATION AND DEBRIDEMENT RIGHT FOOT;  Surgeon: [name], MD;  Location: [facility];  Service: Orthopedics;  Laterality: Right;  . Left heart catheterization with coronary angiogram N/A 02/18/2013    Procedure: LEFT HEART CATHETERIZATION WITH CORONARY ANGIOGRAM;  Surgeon: [name], MD;  Location: [facility] CATH LAB;  Service: Cardiovascular;  Laterality: N/A;  . Amputation Right 12/22/2013    Procedure: AMPUTATION BELOW KNEE;  Surgeon: [name], MD;  Location: [facility];  Service: Orthopedics;  Laterality: Right;  . Incision and drainage of wound Right 12/22/2013    Procedure: I&D RIGHT BUTTOCK;  Surgeon: [name], MD;  Location: [facility];  Service: Orthopedics;  Laterality: Right;   Kallie Locks, MS, RD, LDN Pager # [phone] After hours/ weekend pager # [phone]

## 2014-01-15 NOTE — Progress Notes (Signed)
Subjective/Complaints: Complains of ACE wrap falling off. Phantom pain comes and goes.   Objective: Vital Signs: Blood pressure 144/80, pulse 98, temperature 99 F (37.2 C), temperature source Oral, resp. rate 16, weight 159.8 kg (352 lb 4.7 oz), SpO2 96 %. No results found. Results for orders placed or performed during the hospital encounter of 12/26/13 (from the past 72 hour(s))  Glucose, capillary     Status: Abnormal   Collection Time: 01/12/14 11:09 AM  Result Value Ref Range   Glucose-Capillary 166 (H) 70 - 99 mg/dL  Glucose, capillary     Status: Abnormal   Collection Time: 01/12/14  4:27 PM  Result Value Ref Range   Glucose-Capillary 198 (H) 70 - 99 mg/dL  Glucose, capillary     Status: Abnormal   Collection Time: 01/12/14  8:40 PM  Result Value Ref Range   Glucose-Capillary 131 (H) 70 - 99 mg/dL   Comment 1 Notify RN   Protime-INR     Status: Abnormal   Collection Time: 01/13/14  5:10 AM  Result Value Ref Range   Prothrombin Time 17.6 (H) 11.6 - 15.2 seconds   INR 1.43 0.00 - 1.49  Glucose, capillary     Status: Abnormal   Collection Time: 01/13/14  7:15 AM  Result Value Ref Range   Glucose-Capillary 274 (H) 70 - 99 mg/dL  Glucose, capillary     Status: Abnormal   Collection Time: 01/13/14 12:08 PM  Result Value Ref Range   Glucose-Capillary 100 (H) 70 - 99 mg/dL  Culture, Urine     Status: None   Collection Time: 01/13/14  4:14 PM  Result Value Ref Range   Specimen Description URINE, CATHETERIZED    Special Requests NONE    Colony Count NO GROWTH Performed at [facility]     Culture NO GROWTH Performed at [facility]     Report Status 01/15/2014 FINAL   Urinalysis, Routine w reflex microscopic     Status: Abnormal   Collection Time: 01/13/14  4:14 PM  Result Value Ref Range   Color, Urine YELLOW YELLOW   APPearance CLOUDY (A) CLEAR   Specific Gravity, Urine 1.016 1.005 - 1.030   pH 5.0 5.0 - 8.0   Glucose, UA 250 (A) NEGATIVE mg/dL    Hgb urine dipstick SMALL (A) NEGATIVE   Bilirubin Urine NEGATIVE NEGATIVE   Ketones, ur NEGATIVE NEGATIVE mg/dL   Protein, ur 100 (A) NEGATIVE mg/dL   Urobilinogen, UA 0.2 0.0 - 1.0 mg/dL   Nitrite NEGATIVE NEGATIVE   Leukocytes, UA NEGATIVE NEGATIVE  Urine microscopic-add on     Status: Abnormal   Collection Time: 01/13/14  4:14 PM  Result Value Ref Range   Squamous Epithelial / LPF RARE RARE   WBC, UA 0-2 <3 WBC/hpf   RBC / HPF 0-2 <3 RBC/hpf   Bacteria, UA MANY (A) RARE   Casts HYALINE CASTS (A) NEGATIVE  Glucose, capillary     Status: Abnormal   Collection Time: 01/13/14  4:30 PM  Result Value Ref Range   Glucose-Capillary 139 (H) 70 - 99 mg/dL   Comment 1 Notify RN   Glucose, capillary     Status: Abnormal   Collection Time: 01/13/14  8:55 PM  Result Value Ref Range   Glucose-Capillary 171 (H) 70 - 99 mg/dL   Comment 1 Notify RN   Protime-INR     Status: Abnormal   Collection Time: 01/14/14  6:05 AM  Result Value Ref Range   Prothrombin  Time 17.2 (H) 11.6 - 15.2 seconds   INR 1.39 0.00 - 1.49  Glucose, capillary     Status: Abnormal   Collection Time: 01/14/14  6:52 AM  Result Value Ref Range   Glucose-Capillary 208 (H) 70 - 99 mg/dL   Comment 1 Notify RN   Glucose, capillary     Status: Abnormal   Collection Time: 01/14/14 11:18 AM  Result Value Ref Range   Glucose-Capillary 134 (H) 70 - 99 mg/dL  Glucose, capillary     Status: Abnormal   Collection Time: 01/14/14  4:48 PM  Result Value Ref Range   Glucose-Capillary 128 (H) 70 - 99 mg/dL  Glucose, capillary     Status: Abnormal   Collection Time: 01/14/14  8:51 PM  Result Value Ref Range   Glucose-Capillary 132 (H) 70 - 99 mg/dL  Protime-INR     Status: Abnormal   Collection Time: 01/15/14  4:16 AM  Result Value Ref Range   Prothrombin Time 19.5 (H) 11.6 - 15.2 seconds   INR 1.64 (H) 0.00 - 1.49  CBC     Status: Abnormal   Collection Time: 01/15/14  4:16 AM  Result Value Ref Range   WBC 6.9 4.0 - 10.5  K/uL   RBC 2.49 (L) 4.22 - 5.81 MIL/uL   Hemoglobin 6.9 (LL) 13.0 - 17.0 g/dL    Comment: REPEATED TO VERIFY CRITICAL RESULT CALLED TO, READ BACK BY AND VERIFIED WITH: H MACK,RN 0456 01/15/14 D BRADLEY    HCT 21.2 (L) 39.0 - 52.0 %   MCV 85.1 78.0 - 100.0 fL   MCH 27.7 26.0 - 34.0 pg   MCHC 32.5 30.0 - 36.0 g/dL   RDW 14.2 11.5 - 15.5 %   Platelets 319 150 - 400 K/uL  Glucose, capillary     Status: Abnormal   Collection Time: 01/15/14  6:56 AM  Result Value Ref Range   Glucose-Capillary 191 (H) 70 - 99 mg/dL     HEENT: normal Cardio: RRR and no murmurs Resp: CTA B/L and unlabored GI: BS positive and nontender nondistended Extremity:  Edema left stump Skin:   Wound clean with minimal drainage from buttock--iodoform. Right stump with minimal to no drainage. Neuro: Alert/Oriented and Abnormal Motor decreased left knee flexion extension inhibited by pain Musc/Skel:  Right leg appropriately tender. Gen NAD   Assessment/Plan: 1. Functional deficits secondary to Right BKA which require 3+ hours per day of interdisciplinary therapy in a comprehensive inpatient rehab setting. Physiatrist is providing close team supervision and 24 hour management of active medical problems listed below. Physiatrist and rehab team continue to assess barriers to discharge/monitor patient progress toward functional and medical goals. FIM: FIM - Bathing Bathing Steps Patient Completed: Chest, Right Arm, Left Arm, Abdomen, Front perineal area, Buttocks, Right upper leg, Left upper leg, Left lower leg (including foot) Bathing: 6: Assistive device (Comment)  FIM - Upper Body Dressing/Undressing Upper body dressing/undressing steps patient completed: Thread/unthread right sleeve of pullover shirt/dresss, Thread/unthread left sleeve of pullover shirt/dress, Put head through opening of pull over shirt/dress, Pull shirt over trunk Upper body dressing/undressing: 7: Complete Independence: No helper FIM - Lower Body  Dressing/Undressing Lower body dressing/undressing steps patient completed: Thread/unthread right pants leg, Thread/unthread left pants leg, Don/Doff left sock Lower body dressing/undressing: 4: Steadying Assist  FIM - Toileting Toileting steps completed by patient: Adjust clothing prior to toileting, Performs perineal hygiene, Adjust clothing after toileting Toileting Assistive Devices: Grab bar or rail for support Toileting: 6: More than reasonable  amount of time  FIM - Radio producer Devices: Grab bars Toilet Transfers: 6-To toilet/ BSC, 6-From toilet/BSC  FIM - Control and instrumentation engineer Devices: Environmental consultant, Arm rests Bed/Chair Transfer: 6: Supine > Sit: No assist, 6: Bed > Chair or W/C: No assist, 6: Chair or W/C > Bed: No assist  FIM - Locomotion: Wheelchair Distance: 250 Locomotion: Wheelchair: 6: Travels 150 ft or more, turns around, maneuvers to table, bed or toilet, negotiates 3% grade: maneuvers on rugs and over door sills independently FIM - Locomotion: Ambulation Locomotion: Ambulation Assistive Devices: Administrator Ambulation/Gait Assistance: 4: Min guard Locomotion: Ambulation: 0: Activity did not occur  Comprehension Comprehension Mode: Auditory Comprehension: 6-Follows complex conversation/direction: With extra time/assistive device  Expression Expression Mode: Verbal Expression: 6-Expresses complex ideas: With extra time/assistive device  Social Interaction Social Interaction: 6-Interacts appropriately with others with medication or extra time (anti-anxiety, antidepressant).  Problem Solving Problem Solving: 6-Solves complex problems: With extra time  Memory Memory: 6-More than reasonable amt of time  Medical Problem List and Plan: 1. Functional deficits secondary to R-BKA, also severely deconditioned  -will go back to stump shrinker---needs new one 2. DVT Prophylaxis/Anticoagulation: Pharmaceutical:  Lovenox 3. Pain Management: Premedicate prior to therapy sessions. --  oxycodone prn  -increased gabapentin   4. Mood: team to provide ego support. Added lexapro for mood stabilization. LCSW to follow for evaluation and support.  .  5. Neuropsych: This patient is capable of making decisions on his own behalf. 6. Skin/Wound Care: Daily dressing changes to left buttock wound. Routine pressure relief measures. Monitor wound daily for healing. Maintain adequate nutrition. .   -assured pt that some s/s drainage is to be expected from wound  -afebrile  -ACE wrap with dressing only---switch back to shrinker before dc 7. Fluids/Electrolytes/Nutrition: Monitor I/O. Added protein supplements to help with low protein stores.   -dc megace.    -RD consult 8. Reactive leucocytosis: Resolving . Monitor wound and temperature curve daily. Low grade temp still  9. DM type 2:  stopped lantus---began low dose 70/30 insulin 5u bid---increase to 10u  -probably will need further titration  -intake improved    10. CKD: Monitor renal status closely as on IV Vancomycin. Avoid other nephrotoxic drugs.   11. HTN: Monitor BP tid on norvasc. Off prinizide and lasix due to AKI on CKD. Baseline Cr- 1.78 but is stabilizing to 2.3 range.   -increased coreg to 12.5mg  bid 12. Acute on chronic anemia: Low iron stores with high ferritin levels.  Transfuse  2 units on.  -check stool for ob.  13. Left buttock wound: S/p I & D 12/14--antibiotics complete  14. Resting tachycardia: bb increased  -improved HR       LOS (Days) 20 A FACE TO FACE EVALUATION WAS PERFORMED  Jenasis Straley T 01/15/2014, 7:43 AM

## 2014-01-15 NOTE — Progress Notes (Signed)
ANTICOAGULATION CONSULT NOTE - Follow Up Consult  Pharmacy Consult for coumadin and lovenox Indication: hx of DVT/PE  No Known Allergies  Patient Measurements: Weight: (!) 352 lb 4.7 oz (159.8 kg) Heparin Dosing Weight:   Vital Signs: Temp: 99 F (37.2 C) (01/07 0511) Temp Source: Oral (01/07 0511) BP: 144/80 mmHg (01/07 0511) Pulse Rate: 98 (01/07 0511)  Labs:  Recent Labs  01/13/14 0510 01/14/14 0605 01/15/14 0416  HGB  --   --  6.9*  HCT  --   --  21.2*  PLT  --   --  319  LABPROT 17.6* 17.2* 19.5*  INR 1.43 1.39 1.64*    Estimated Creatinine Clearance: 77.1 mL/min (by C-G formula based on Cr of 2.1).   Medications:  Scheduled:  . sodium chloride   Intravenous Once  . acetaminophen  650 mg Oral Once  . amLODipine  10 mg Oral Daily  . calcium carbonate  1 tablet Oral TID WC  . carvedilol  12.5 mg Oral BID WC  . diphenhydrAMINE  25 mg Oral Once  . enoxaparin (LOVENOX) injection  150 mg Subcutaneous Q12H  . escitalopram  20 mg Oral QHS  . feeding supplement (GLUCERNA SHAKE)  237 mL Oral BID BM  . furosemide  20 mg Oral Daily  . gabapentin  200 mg Oral TID  . insulin aspart  0-15 Units Subcutaneous TID WC  . insulin aspart protamine- aspart  10 Units Subcutaneous BID WC  . multivitamin with minerals  1 tablet Oral Daily  . pantoprazole  40 mg Oral BID  . pravastatin  20 mg Oral q1800  . senna-docusate  2 tablet Oral BID  . sorbitol  45 mL Oral Once  . tamsulosin  0.4 mg Oral QPC supper  . Warfarin - Pharmacist Dosing Inpatient   Does not apply q1800   Infusions:    Assessment: 31 male with hx of PE/DVT is currently on subtherapeutic coumadin bridging with treatment dose of lovenox.  INR today is up to 1.64 from 1.39.  Hgb down to 6.9 and Plt 319 K. No notable bleeding Goal of Therapy:  Anti-Xa level 0.6-1 units/ml 4hrs after LMWH dose given; INR 2-3 Monitor platelets by anticoagulation protocol: Yes   Plan:  - Coumadin 10 mg po today and cont lovenox  150 mg sq q12h. - Cont daily INR and CBC every 72 hours (monitor for bleeding)  Bryelle Spiewak, [name] 01/15/2014,8:14 AM
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
## 2014-01-15 NOTE — Progress Notes (Signed)
CRITICAL VALUE ALERT  Critical value received:  Hemoglobin 6.9  Date of notification:  01/15/14  Time of notification:  0500  Critical value read back:Yes.    Nurse who received alert:  Candace Cruise, RN  MD notified (1st page):  Marlowe Shores, PA  Time of first page:  [phone]  MD notified (2nd page):  Time of second page:  Responding MD:  Marlowe Shores, [place]  Time MD responded:  [phone]

## 2014-01-16 ENCOUNTER — Inpatient Hospital Stay (HOSPITAL_COMMUNITY): Payer: Medicaid Other | Admitting: Physical Therapy

## 2014-01-16 ENCOUNTER — Encounter (HOSPITAL_COMMUNITY): Payer: Self-pay

## 2014-01-16 ENCOUNTER — Inpatient Hospital Stay (HOSPITAL_COMMUNITY): Payer: Medicaid Other

## 2014-01-16 LAB — TYPE AND SCREEN
ABO/RH(D): B POS
Antibody Screen: NEGATIVE
Unit division: 0
Unit division: 0

## 2014-01-16 LAB — GLUCOSE, CAPILLARY
GLUCOSE-CAPILLARY: 161 mg/dL — AB (ref 70–99)
Glucose-Capillary: 133 mg/dL — ABNORMAL HIGH (ref 70–99)
Glucose-Capillary: 147 mg/dL — ABNORMAL HIGH (ref 70–99)
Glucose-Capillary: 121 mg/dL — ABNORMAL HIGH (ref 70–99)

## 2014-01-16 LAB — OCCULT BLOOD X 1 CARD TO LAB, STOOL: Fecal Occult Bld: NEGATIVE

## 2014-01-16 LAB — PROTIME-INR
INR: 1.99 — AB (ref 0.00–1.49)
Prothrombin Time: 22.8 seconds — ABNORMAL HIGH (ref 11.6–15.2)

## 2014-01-16 MED ORDER — ALPRAZOLAM 0.25 MG PO TABS
0.2500 mg | ORAL_TABLET | Freq: Two times a day (BID) | ORAL | Status: DC | PRN
  Administered 2014-01-16 – 2014-01-20 (×6): 0.25 mg via ORAL
  Filled 2014-01-16 (×6): qty 1

## 2014-01-16 MED ORDER — WARFARIN SODIUM 10 MG PO TABS
10.0000 mg | ORAL_TABLET | Freq: Once | ORAL | Status: DC
  Filled 2014-01-16: qty 1

## 2014-01-16 MED ORDER — WARFARIN SODIUM 7.5 MG PO TABS
7.5000 mg | ORAL_TABLET | Freq: Once | ORAL | Status: AC
  Administered 2014-01-16: 7.5 mg via ORAL
  Filled 2014-01-16: qty 1

## 2014-01-16 MED ORDER — GABAPENTIN 300 MG PO CAPS
300.0000 mg | ORAL_CAPSULE | Freq: Three times a day (TID) | ORAL | Status: DC
  Administered 2014-01-16 – 2014-01-20 (×14): 300 mg via ORAL
  Filled 2014-01-16 (×15): qty 1

## 2014-01-16 NOTE — Progress Notes (Addendum)
ANTICOAGULATION CONSULT NOTE - Follow Up Consult  Pharmacy Consult for coumadin and lovenox Indication: hx of DVT/PE  No Known Allergies  Patient Measurements: Weight: (!) 365 lb (165.563 kg) (pt didn't want me to take pillows off bed) Heparin Dosing Weight:   Vital Signs: Temp: 98.6 F (37 C) (01/08 0449) Temp Source: Oral (01/08 0449) BP: 154/83 mmHg (01/08 0449) Pulse Rate: 93 (01/08 0449)  Labs:  Recent Labs  01/14/14 0605 01/15/14 0416 01/16/14 0315  HGB  --  6.9*  --   HCT  --  21.2*  --   PLT  --  319  --   LABPROT 17.2* 19.5* 22.8*  INR 1.39 1.64* 1.99*    Estimated Creatinine Clearance: 78.7 mL/min (by C-G formula based on Cr of 2.1).   Medications:  Scheduled:  . amLODipine  10 mg Oral Daily  . calcium carbonate  1 tablet Oral TID WC  . carvedilol  12.5 mg Oral BID WC  . enoxaparin (LOVENOX) injection  150 mg Subcutaneous Q12H  . escitalopram  20 mg Oral QHS  . feeding supplement (GLUCERNA SHAKE)  237 mL Oral BID BM  . furosemide  20 mg Oral Daily  . gabapentin  300 mg Oral TID  . insulin aspart  0-15 Units Subcutaneous TID WC  . insulin aspart protamine- aspart  10 Units Subcutaneous BID WC  . multivitamin with minerals  1 tablet Oral Daily  . pantoprazole  40 mg Oral BID  . pravastatin  20 mg Oral q1800  . senna-docusate  2 tablet Oral BID  . sorbitol  45 mL Oral Once  . tamsulosin  0.4 mg Oral QPC supper  . Warfarin - Pharmacist Dosing Inpatient   Does not apply q1800   Infusions:    Assessment: 39 yo male with hx of DVT/PE is currently on subtherapeutic coumadin bridging with treatment dose of lovenox.  INR today rose nicely to 1.99 from 1.64.  No bleeding noted.  Goal of Therapy:  Anti-Xa level 0.6-1 units/ml 4hrs after LMWH dose given; INR 2-3 Monitor platelets by anticoagulation protocol: Yes   Plan:  - Coumadin 7.5 mg po today (may need 10mg  most days and 7.5 mg few days a week) and cont lovenox 150 mg sq q12h until INR >/= 2 - Cont  daily INR and CBC every 72 hours (monitor for bleeding)   [name], [name] 01/16/2014,8:21 AM

## 2014-01-16 NOTE — Progress Notes (Signed)
Pt has a temp of 99.9 between blood administrations. Notified Algis Liming, PA. Ordered to give Benadryl and Tylenol. Then proceed with the infusion. Will continue to monitor. Kennieth Francois, RN

## 2014-01-16 NOTE — Progress Notes (Signed)
Physical Therapy Session Note  Patient Details  Name: [name] MRN: [id number] Date of Birth: [birth year]  Today's Date: 01/16/2014 PT Individual Time: [id number] Treatment Session 2: 1400-1500 PT Individual Time Calculation (min): 60 min  Treatment Session 2: 60 min  Short Term Goals: Week 3:  PT Short Term Goal 1 (Week 3): STGs = LTGs due to ELOS  Skilled Therapeutic Interventions/Progress Updates:    W/C Management: Pt demonstrates mod I w/c propulsion with B UEs x 150' on level surface in controlled environment.   Therapeutic Activity: PT instructs pt in stand-pivot/hop transfer w/c to mat req CGA for safety - pt has poor foot clearance during hop.  Pt demonstrates mod I sit to/from supine transfer on mat.  PT instructs pt in prone to/from quadruped x 10 reps req SBA and encouragement for this activity.   Therapeutic Exercise: PT instructs pt in B LE ROM and strengthening exercises: min resisted R residual limb LAQ x 10, min resisted L LAQ x 10, bridges with large bolster under B knees x 10, partial sit-ups on mat in supine x 10, side lie hip abduction with AROM to L LE and min resistance to R LE x 10: all done x 3 sets each PT instructs pt in prone modified push ups x 10 reps, min resisted knee flexion in prone x 10 reps to B LEs, prone hip extension x 10 reps to B LEs: all x 3 sets  Treatment Session 2: W/C Management: Pt demonstrates mod I w/c propulsion on level surface in controlled environment x 150'.   Therapeutic Activity: Upon arrival to the gym, pt reports he needs to urinate, and so PT obtains a urinal and pt doffs pants and manages hygiene, but req PT assist to don pants fully while pt does a chair push-up.   Gait Training: PT and 2nd assist PT set pt up in bariatric Maxi-Sky sling and instruct pt to use B handrails to ascend one low step (5" height) - pt performs 4 sit to stands with min A (2 stands were to don sling correctly) and pt attempts to hop up on 2  attempts, but pt is unable to completely clear his foot from the ground and get up. Pt does demonstrate LOB each time in standing and the mechanical harness is useful to slow pt's descent into his w/c, safely.   Pt's ace-wrap bandage fell off during mat mobility of first treatment session and so PT re-wrapped residual limb; incision looked clean and dry. Pt is progressing with strength and mobility, but activity tolerance is still poor and pt's awareness of safety (ie - foot placement) for transfers remains poor. Stairs with a mechanical sling for safety were attempted, but pt is unable to complete one low step due to weakness and low activity tolerance. Stairs are not appropriate for this pt at this time. Girlfriend [name] reports the ramp to her house is almost complete. Pt worked very hard with therapeutic exercises on the mat during first session. Continue per PT POC.    Therapy Documentation Precautions:  Precautions Precautions: Fall Precaution Comments: New Rt BKA, with protector Required Braces or Orthoses: Knee Immobilizer - Right Restrictions Weight Bearing Restrictions: Yes RLE Weight Bearing: Non weight bearing Other Position/Activity Restrictions: new right BKA  Pain: Pain Assessment Pain Assessment: 0-10 Pain Score: 10-Worst pain ever Pain Type: Surgical pain Pain Location: Leg Pain Orientation: Right Pain Descriptors / Indicators: Aching Pain Frequency: Intermittent Pain Onset: On-going Patients Stated Pain Goal: 3 Pain Intervention(s): Emotional  support Multiple Pain Sites: No Treatment Session 2: Pt  C/o 7/10 surgical and phantom pain in R leg - PT uses emotional support, rest, and distraction to decrease pain.   See FIM for current functional status  Therapy/Group: Individual Therapy  [name] M 01/16/2014, 10:37 AM

## 2014-01-16 NOTE — Progress Notes (Signed)
Orthopedic Tech Progress Note Patient Details:  [name] 1975-06-23 [id number] [employer] called for brace order. Order taken by Colletta [place]. Patient ID: [name], male   DOB: 09/15/1975, 39 y.o.   MRN: [id number]   Fenton Foy 01/16/2014, 9:25 AM

## 2014-01-16 NOTE — Progress Notes (Signed)
Pt. Call RN verbalizing that he is having some chest congestion and feel like short of breast,Robitussin was given,VS were check,Oxigen sat 96% at room air.Dr. Read Drivers was called,orders for chest X-ray and Xanax were given.Keep monitoring pt. Closely and assessing his needs.

## 2014-01-16 NOTE — Progress Notes (Signed)
Occupational Therapy Session Note  Patient Details  Name: Demitry Shelor MRN: [id number] Date of Birth: 11-21-1975  Today's Date: 01/16/2014 OT Individual Time: 0900-1000 OT Individual Time Calculation (min): 60 min    Short Term Goals: Week 3:  OT Short Term Goal 1 (Week 3): STG=LTG due to short remaining LOS  Skilled Therapeutic Interventions/Progress Updates:    Pt resting in bed upon arrival.  Pt lethargic this morning stating he did not rest well previous night secondary to blood transfusion during the night.  Pt agreeable to bathing at shower level and dressing with sit<>stand from w/c.  Pt performed bed->w/c transfer with supervision and w/c->shower seat with steady A. Pt completed shower seat->w/c transfer with mod I.  Pt completed all bathing tasks with supervision, using AE appropriately to assist.  Pt required assistance pulling up pants while standing.  Focus on activity tolerance, transfers, sit<>stand, standing balance, and safety awareness.  Therapy Documentation Precautions:  Precautions Precautions: Fall Precaution Comments: New Rt BKA, with protector Required Braces or Orthoses: Knee Immobilizer - Right Restrictions Weight Bearing Restrictions: Yes RLE Weight Bearing: Non weight bearing Other Position/Activity Restrictions: new right BKA  Pain: Pain Assessment Pain Assessment: 0-10 Pain Score: 10-Worst pain ever Pain Type: Surgical pain Pain Location: Leg Pain Orientation: Right Pain Descriptors / Indicators: Aching Pain Frequency: Intermittent Pain Onset: On-going Patients Stated Pain Goal: 3 Pain Intervention(s): Emotional support Multiple Pain Sites: No  See FIM for current functional status  Therapy/Group: Individual Therapy  [name] 01/16/2014, 12:17 PM

## 2014-01-16 NOTE — Progress Notes (Signed)
Subjective/Complaints: Received blood late. Didn't sleep well. Having phantom pain.   Objective: Vital Signs: Blood pressure 154/83, pulse 93, temperature 98.6 F (37 C), temperature source Oral, resp. rate 20, weight 165.563 kg (365 lb), SpO2 100 %. No results found. Results for orders placed or performed during the hospital encounter of 12/26/13 (from the past 72 hour(s))  Glucose, capillary     Status: Abnormal   Collection Time: 01/13/14 12:08 PM  Result Value Ref Range   Glucose-Capillary 100 (H) 70 - 99 mg/dL  Culture, Urine     Status: None   Collection Time: 01/13/14  4:14 PM  Result Value Ref Range   Specimen Description URINE, CATHETERIZED    Special Requests NONE    Colony Count NO GROWTH Performed at [facility]     Culture NO GROWTH Performed at [facility]     Report Status 01/15/2014 FINAL   Urinalysis, Routine w reflex microscopic     Status: Abnormal   Collection Time: 01/13/14  4:14 PM  Result Value Ref Range   Color, Urine YELLOW YELLOW   APPearance CLOUDY (A) CLEAR   Specific Gravity, Urine 1.016 1.005 - 1.030   pH 5.0 5.0 - 8.0   Glucose, UA 250 (A) NEGATIVE mg/dL   Hgb urine dipstick SMALL (A) NEGATIVE   Bilirubin Urine NEGATIVE NEGATIVE   Ketones, ur NEGATIVE NEGATIVE mg/dL   Protein, ur 100 (A) NEGATIVE mg/dL   Urobilinogen, UA 0.2 0.0 - 1.0 mg/dL   Nitrite NEGATIVE NEGATIVE   Leukocytes, UA NEGATIVE NEGATIVE  Urine microscopic-add on     Status: Abnormal   Collection Time: 01/13/14  4:14 PM  Result Value Ref Range   Squamous Epithelial / LPF RARE RARE   WBC, UA 0-2 <3 WBC/hpf   RBC / HPF 0-2 <3 RBC/hpf   Bacteria, UA MANY (A) RARE   Casts HYALINE CASTS (A) NEGATIVE  Glucose, capillary     Status: Abnormal   Collection Time: 01/13/14  4:30 PM  Result Value Ref Range   Glucose-Capillary 139 (H) 70 - 99 mg/dL   Comment 1 Notify RN   Glucose, capillary     Status: Abnormal   Collection Time: 01/13/14  8:55 PM  Result  Value Ref Range   Glucose-Capillary 171 (H) 70 - 99 mg/dL   Comment 1 Notify RN   Protime-INR     Status: Abnormal   Collection Time: 01/14/14  6:05 AM  Result Value Ref Range   Prothrombin Time 17.2 (H) 11.6 - 15.2 seconds   INR 1.39 0.00 - 1.49  Glucose, capillary     Status: Abnormal   Collection Time: 01/14/14  6:52 AM  Result Value Ref Range   Glucose-Capillary 208 (H) 70 - 99 mg/dL   Comment 1 Notify RN   Glucose, capillary     Status: Abnormal   Collection Time: 01/14/14 11:18 AM  Result Value Ref Range   Glucose-Capillary 134 (H) 70 - 99 mg/dL  Glucose, capillary     Status: Abnormal   Collection Time: 01/14/14  4:48 PM  Result Value Ref Range   Glucose-Capillary 128 (H) 70 - 99 mg/dL  Glucose, capillary     Status: Abnormal   Collection Time: 01/14/14  8:51 PM  Result Value Ref Range   Glucose-Capillary 132 (H) 70 - 99 mg/dL  Protime-INR     Status: Abnormal   Collection Time: 01/15/14  4:16 AM  Result Value Ref Range   Prothrombin Time 19.5 (H) 11.6 -  15.2 seconds   INR 1.64 (H) 0.00 - 1.49  CBC     Status: Abnormal   Collection Time: 01/15/14  4:16 AM  Result Value Ref Range   WBC 6.9 4.0 - 10.5 K/uL   RBC 2.49 (L) 4.22 - 5.81 MIL/uL   Hemoglobin 6.9 (LL) 13.0 - 17.0 g/dL    Comment: REPEATED TO VERIFY CRITICAL RESULT CALLED TO, READ BACK BY AND VERIFIED WITH: H MACK,RN [id number] 01/15/14 D BRADLEY    HCT 21.2 (L) 39.0 - 52.0 %   MCV 85.1 78.0 - 100.0 fL   MCH 27.7 26.0 - 34.0 pg   MCHC 32.5 30.0 - 36.0 g/dL   RDW 14.2 11.5 - 15.5 %   Platelets 319 150 - 400 K/uL  Glucose, capillary     Status: Abnormal   Collection Time: 01/15/14  6:56 AM  Result Value Ref Range   Glucose-Capillary 191 (H) 70 - 99 mg/dL  Prepare RBC     Status: None   Collection Time: 01/15/14  9:55 AM  Result Value Ref Range   Order Confirmation ORDER PROCESSED BY BLOOD BANK   Type and screen     Status: None (Preliminary result)   Collection Time: 01/15/14  9:55 AM  Result Value Ref  Range   ABO/RH(D) B POS    Antibody Screen NEG    Sample Expiration 01/18/2014    Unit Number [id number]    Blood Component Type RED CELLS,LR    Unit division 00    Status of Unit ISSUED    Transfusion Status OK TO TRANSFUSE    Crossmatch Result Compatible    Unit Number [id number]    Blood Component Type RED CELLS,LR    Unit division 00    Status of Unit ISSUED    Transfusion Status OK TO TRANSFUSE    Crossmatch Result Compatible   Glucose, capillary     Status: Abnormal   Collection Time: 01/15/14 11:20 AM  Result Value Ref Range   Glucose-Capillary 147 (H) 70 - 99 mg/dL  Glucose, capillary     Status: Abnormal   Collection Time: 01/15/14  4:28 PM  Result Value Ref Range   Glucose-Capillary 187 (H) 70 - 99 mg/dL  Glucose, capillary     Status: Abnormal   Collection Time: 01/15/14  8:44 PM  Result Value Ref Range   Glucose-Capillary 148 (H) 70 - 99 mg/dL  Protime-INR     Status: Abnormal   Collection Time: 01/16/14  3:15 AM  Result Value Ref Range   Prothrombin Time 22.8 (H) 11.6 - 15.2 seconds   INR 1.99 (H) 0.00 - 1.49  Glucose, capillary     Status: Abnormal   Collection Time: 01/16/14  6:41 AM  Result Value Ref Range   Glucose-Capillary 161 (H) 70 - 99 mg/dL  Occult blood card to lab, stool RN will collect     Status: None   Collection Time: 01/16/14  6:46 AM  Result Value Ref Range   Fecal Occult Bld NEGATIVE NEGATIVE     HEENT: normal Cardio: RRR and no murmurs Resp: CTA B/L and unlabored GI: BS positive and nontender nondistended Extremity:  Edema left stump Skin:   Wound clean with minimal drainage from buttock--iodoform. Right stump with minimal to no drainage. Neuro: Alert/Oriented and Abnormal Motor decreased left knee flexion extension inhibited by pain Musc/Skel:  Right leg appropriately tender. Gen NAD   Assessment/Plan: 1. Functional deficits secondary to Right BKA which require 3+ hours per day of interdisciplinary  therapy in a  comprehensive inpatient rehab setting. Physiatrist is providing close team supervision and 24 hour management of active medical problems listed below. Physiatrist and rehab team continue to assess barriers to discharge/monitor patient progress toward functional and medical goals. FIM: FIM - Bathing Bathing Steps Patient Completed: Chest, Right Arm, Left Arm, Abdomen, Front perineal area, Buttocks, Right upper leg, Left upper leg, Left lower leg (including foot) Bathing: 5: Supervision: Safety issues/verbal cues  FIM - Upper Body Dressing/Undressing Upper body dressing/undressing steps patient completed: Thread/unthread right sleeve of pullover shirt/dresss, Thread/unthread left sleeve of pullover shirt/dress, Put head through opening of pull over shirt/dress, Pull shirt over trunk Upper body dressing/undressing: 5: Set-up assist to: Obtain clothing/put away FIM - Lower Body Dressing/Undressing Lower body dressing/undressing steps patient completed: Thread/unthread right underwear leg, Thread/unthread left underwear leg, Pull underwear up/down, Thread/unthread right pants leg, Thread/unthread left pants leg, Pull pants up/down, Don/Doff left sock Lower body dressing/undressing: 4: Min-Patient completed 75 plus % of tasks  FIM - Toileting Toileting steps completed by patient: Adjust clothing prior to toileting, Performs perineal hygiene, Adjust clothing after toileting Toileting Assistive Devices: Grab bar or rail for support Toileting: 6: More than reasonable amount of time  FIM - Radio producer Devices: Grab bars Toilet Transfers: 6-To toilet/ BSC, 6-From toilet/BSC  FIM - Control and instrumentation engineer Devices: Environmental consultant, Sliding board, Arm rests Bed/Chair Transfer: 4: Bed > Chair or W/C: Min A (steadying Pt. > 75%), 4: Chair or W/C > Bed: Min A (steadying Pt. > 75%)  FIM - Locomotion: Wheelchair Distance: 150 Locomotion: Wheelchair: 6:  Travels 150 ft or more, turns around, maneuvers to table, bed or toilet, negotiates 3% grade: maneuvers on rugs and over door sills independently FIM - Locomotion: Ambulation Locomotion: Ambulation Assistive Devices: Administrator Ambulation/Gait Assistance: 4: Min guard Locomotion: Ambulation: 0: Activity did not occur  Comprehension Comprehension Mode: Auditory Comprehension: 6-Follows complex conversation/direction: With extra time/assistive device  Expression Expression Mode: Verbal Expression: 6-Expresses complex ideas: With extra time/assistive device  Social Interaction Social Interaction: 6-Interacts appropriately with others with medication or extra time (anti-anxiety, antidepressant).  Problem Solving Problem Solving: 5-Solves complex 90% of the time/cues < 10% of the time  Memory Memory: 6-More than reasonable amt of time  Medical Problem List and Plan: 1. Functional deficits secondary to R-BKA, also severely deconditioned 2. DVT Prophylaxis/Anticoagulation: Pharmaceutical: Lovenox 3. Pain Management: Premedicate prior to therapy sessions. --  oxycodone prn  -increase gabapentin to 300mg  tid   4. Mood: team to provide ego support. Added lexapro for mood stabilization. LCSW to follow for evaluation and support.  .  5. Neuropsych: This patient is capable of making decisions on his own behalf. 6. Skin/Wound Care: Daily dressing changes to left buttock wound. Routine pressure relief measures. Monitor wound daily for healing. Maintain adequate nutrition. .   -assured pt that some s/s drainage is to be expected from wound  -afebrile  -Ask [employer]otech for new stump shrinkers 7. Fluids/Electrolytes/Nutrition: Monitor I/O. Added protein supplements to help with low protein stores.   -dc megace.    -RD consult 8. Reactive leucocytosis: Resolving . Monitor wound and temperature curve daily. Low grade temp still  9. DM type 2:  stopped lantus---  70/30 insulin increased to  10u  -intake improving--adjust insulin as needed    10. CKD: Monitor renal status closely as on IV Vancomycin. Avoid other nephrotoxic drugs.   11. HTN: Monitor BP tid on norvasc. Off prinizide and lasix due  to AKI on CKD. Baseline Cr- 1.78 but is stabilizing to 2.3 range.   -increased coreg to 12.5mg  bid 12. Acute on chronic anemia: Low iron stores with high ferritin levels.  Transfused  2 units yesterday  -stool ob neg.  13. Left buttock wound: S/p I & D 12/14--antibiotics complete  14. Resting tachycardia: bb increased  -improved HR       LOS (Days) 21 A FACE TO FACE EVALUATION WAS PERFORMED  Bridey Brookover T 01/16/2014, 8:04 AM

## 2014-01-16 NOTE — Progress Notes (Signed)
Pt was premedicated with Benadryl and Tylenol due to a fever of 99.9 per order by Algis Liming, PA. Blood Administration started at 2320.   Pre-Administration vitals(2300): BP- 135/80, P-91, T- 99.3, R- 20, O2- 98% on room air.  Blood Started and double checked with Bernita at a rate of 120 ml/hr  15 min vitals (2335): BP- 141/70, T- 99.3, P- 9, R- 20, O2- 99% on room air.  Rate increased to 116ml/hr.  Vitals checked every hour, see flowsheets.   Blood Administration ended at 0140 with a volume of 335 infused.   Vital signs: BP- 153/77, T- 99.1, P- 90, R- 18, O2- 100% room air  Patient shows no signs of distress at this time. Will continue to monitor. Kennieth Francois, RN

## 2014-01-17 ENCOUNTER — Inpatient Hospital Stay (HOSPITAL_COMMUNITY): Payer: Medicaid Other | Admitting: *Deleted

## 2014-01-17 DIAGNOSIS — J189 Pneumonia, unspecified organism: Secondary | ICD-10-CM

## 2014-01-17 LAB — GLUCOSE, CAPILLARY
GLUCOSE-CAPILLARY: 197 mg/dL — AB (ref 70–99)
Glucose-Capillary: 144 mg/dL — ABNORMAL HIGH (ref 70–99)
Glucose-Capillary: 119 mg/dL — ABNORMAL HIGH (ref 70–99)
Glucose-Capillary: 98 mg/dL (ref 70–99)

## 2014-01-17 LAB — PROTIME-INR
INR: 1.99 — ABNORMAL HIGH (ref 0.00–1.49)
PROTHROMBIN TIME: 22.8 s — AB (ref 11.6–15.2)

## 2014-01-17 MED ORDER — WARFARIN SODIUM 10 MG PO TABS
10.0000 mg | ORAL_TABLET | Freq: Once | ORAL | Status: AC
  Administered 2014-01-17: 10 mg via ORAL
  Filled 2014-01-17: qty 1

## 2014-01-17 MED ORDER — PIPERACILLIN-TAZOBACTAM 3.375 G IVPB
3.3750 g | Freq: Three times a day (TID) | INTRAVENOUS | Status: DC
  Administered 2014-01-17 – 2014-01-20 (×9): 3.375 g via INTRAVENOUS
  Filled 2014-01-17 (×11): qty 50

## 2014-01-17 NOTE — Progress Notes (Signed)
Subjective/Complaints: Review of Systems - Negative except ccoughing , no abdominal pain diarrhea or constipation Coughing, no shortness of breath. Has nausea no vomiting. Did not eat breakfast this morning  Objective: Vital Signs: Blood pressure 141/72, pulse 101, temperature 98.6 F (37 C), temperature source Oral, resp. rate 22, weight 165.563 kg (365 lb), SpO2 90 %. Dg Chest 2 View  01/16/2014   CLINICAL DATA:  Shortness of breath, cough  EXAM: CHEST  2 VIEW  COMPARISON:  12/16/2013  FINDINGS: Low lung volumes. Mild patchy left lower lobe opacity, suspicious for pneumonia. No pleural effusion or pneumothorax.  Mild cardiomegaly.  Mild degenerative changes of the visualized thoracolumbar spine.  IMPRESSION: Mild patchy left upper lobe opacity, suspicious for pneumonia.   Electronically Signed   By: Julian Hy M.D.   On: 01/16/2014 22:03   Results for orders placed or performed during the hospital encounter of 12/26/13 (from the past 72 hour(s))  Glucose, capillary     Status: Abnormal   Collection Time: 01/14/14 11:18 AM  Result Value Ref Range   Glucose-Capillary 134 (H) 70 - 99 mg/dL  Glucose, capillary     Status: Abnormal   Collection Time: 01/14/14  4:48 PM  Result Value Ref Range   Glucose-Capillary 128 (H) 70 - 99 mg/dL  Glucose, capillary     Status: Abnormal   Collection Time: 01/14/14  8:51 PM  Result Value Ref Range   Glucose-Capillary 132 (H) 70 - 99 mg/dL  Protime-INR     Status: Abnormal   Collection Time: 01/15/14  4:16 AM  Result Value Ref Range   Prothrombin Time 19.5 (H) 11.6 - 15.2 seconds   INR 1.64 (H) 0.00 - 1.49  CBC     Status: Abnormal   Collection Time: 01/15/14  4:16 AM  Result Value Ref Range   WBC 6.9 4.0 - 10.5 K/uL   RBC 2.49 (L) 4.22 - 5.81 MIL/uL   Hemoglobin 6.9 (LL) 13.0 - 17.0 g/dL    Comment: REPEATED TO VERIFY CRITICAL RESULT CALLED TO, READ BACK BY AND VERIFIED WITH: H MACK,RN 0456 01/15/14 D BRADLEY    HCT 21.2 (L) 39.0 - 52.0  %   MCV 85.1 78.0 - 100.0 fL   MCH 27.7 26.0 - 34.0 pg   MCHC 32.5 30.0 - 36.0 g/dL   RDW 14.2 11.5 - 15.5 %   Platelets 319 150 - 400 K/uL  Glucose, capillary     Status: Abnormal   Collection Time: 01/15/14  6:56 AM  Result Value Ref Range   Glucose-Capillary 191 (H) 70 - 99 mg/dL  Prepare RBC     Status: None   Collection Time: 01/15/14  9:55 AM  Result Value Ref Range   Order Confirmation ORDER PROCESSED BY BLOOD BANK   Type and screen     Status: None   Collection Time: 01/15/14  9:55 AM  Result Value Ref Range   ABO/RH(D) B POS    Antibody Screen NEG    Sample Expiration 01/18/2014    Unit Number [id number]    Blood Component Type RED CELLS,LR    Unit division 00    Status of Unit ISSUED,FINAL    Transfusion Status OK TO TRANSFUSE    Crossmatch Result Compatible    Unit Number [id number]    Blood Component Type RED CELLS,LR    Unit division 00    Status of Unit ISSUED,FINAL    Transfusion Status OK TO TRANSFUSE    Crossmatch Result Compatible  Glucose, capillary     Status: Abnormal   Collection Time: 01/15/14 11:20 AM  Result Value Ref Range   Glucose-Capillary 147 (H) 70 - 99 mg/dL  Glucose, capillary     Status: Abnormal   Collection Time: 01/15/14  4:28 PM  Result Value Ref Range   Glucose-Capillary 187 (H) 70 - 99 mg/dL  Glucose, capillary     Status: Abnormal   Collection Time: 01/15/14  8:44 PM  Result Value Ref Range   Glucose-Capillary 148 (H) 70 - 99 mg/dL  Protime-INR     Status: Abnormal   Collection Time: 01/16/14  3:15 AM  Result Value Ref Range   Prothrombin Time 22.8 (H) 11.6 - 15.2 seconds   INR 1.99 (H) 0.00 - 1.49  Glucose, capillary     Status: Abnormal   Collection Time: 01/16/14  6:41 AM  Result Value Ref Range   Glucose-Capillary 161 (H) 70 - 99 mg/dL  Occult blood card to lab, stool RN will collect     Status: None   Collection Time: 01/16/14  6:46 AM  Result Value Ref Range   Fecal Occult Bld NEGATIVE NEGATIVE  Glucose,  capillary     Status: Abnormal   Collection Time: 01/16/14 12:01 PM  Result Value Ref Range   Glucose-Capillary 133 (H) 70 - 99 mg/dL  Glucose, capillary     Status: Abnormal   Collection Time: 01/16/14  4:38 PM  Result Value Ref Range   Glucose-Capillary 147 (H) 70 - 99 mg/dL  Glucose, capillary     Status: Abnormal   Collection Time: 01/16/14  8:49 PM  Result Value Ref Range   Glucose-Capillary 121 (H) 70 - 99 mg/dL  Protime-INR     Status: Abnormal   Collection Time: 01/17/14  5:56 AM  Result Value Ref Range   Prothrombin Time 22.8 (H) 11.6 - 15.2 seconds   INR 1.99 (H) 0.00 - 1.49  Glucose, capillary     Status: Abnormal   Collection Time: 01/17/14  6:51 AM  Result Value Ref Range   Glucose-Capillary 197 (H) 70 - 99 mg/dL     HEENT: normal Cardio: RRR and no murmurs Resp: CTA B/L and unlabored GI: BS positive and nontender nondistended Extremity:  Edema left stump Skin:   Wound clean with minimal drainage from buttock--iodoform. Right stump with minimal to no drainage. Neuro: Alert/Oriented and Abnormal Motor decreased left knee flexion extension inhibited by pain Musc/Skel:  Right leg appropriately tender. Gen NAD   Assessment/Plan: 1. Functional deficits secondary to Right BKA which require 3+ hours per day of interdisciplinary therapy in a comprehensive inpatient rehab setting. Physiatrist is providing close team supervision and 24 hour management of active medical problems listed below. Physiatrist and rehab team continue to assess barriers to discharge/monitor patient progress toward functional and medical goals. FIM: FIM - Bathing Bathing Steps Patient Completed: Chest, Right Arm, Left Arm, Abdomen, Front perineal area, Buttocks, Right upper leg, Left upper leg, Left lower leg (including foot) Bathing: 5: Supervision: Safety issues/verbal cues  FIM - Upper Body Dressing/Undressing Upper body dressing/undressing steps patient completed: Thread/unthread right  sleeve of pullover shirt/dresss, Thread/unthread left sleeve of pullover shirt/dress, Put head through opening of pull over shirt/dress, Pull shirt over trunk Upper body dressing/undressing: 5: Set-up assist to: Obtain clothing/put away FIM - Lower Body Dressing/Undressing Lower body dressing/undressing steps patient completed: Thread/unthread right underwear leg, Thread/unthread left underwear leg, Pull underwear up/down, Thread/unthread right pants leg, Thread/unthread left pants leg, Pull pants up/down, Don/Doff left  sock Lower body dressing/undressing: 4: Min-Patient completed 75 plus % of tasks  FIM - Toileting Toileting steps completed by patient: Adjust clothing prior to toileting, Performs perineal hygiene Toileting Assistive Devices: Grab bar or rail for support Toileting: 3: Mod-Patient completed 2 of 3 steps  FIM - Radio producer Devices: Product manager Transfers: 0-Activity did not occur  FIM - Control and instrumentation engineer Devices: Environmental consultant, Arm rests, Sliding board Bed/Chair Transfer: 6: Supine > Sit: No assist, 6: Sit > Supine: No assist, 4: Bed > Chair or W/C: Min A (steadying Pt. > 75%), 4: Chair or W/C > Bed: Min A (steadying Pt. > 75%)  FIM - Locomotion: Wheelchair Distance: 150 Locomotion: Wheelchair: 6: Travels 150 ft or more, turns around, maneuvers to table, bed or toilet, negotiates 3% grade: maneuvers on rugs and over door sills independently FIM - Locomotion: Ambulation Locomotion: Ambulation Assistive Devices: Administrator Ambulation/Gait Assistance: 4: Min guard Locomotion: Ambulation: 0: Activity did not occur  Comprehension Comprehension Mode: Auditory Comprehension: 6-Follows complex conversation/direction: With extra time/assistive device  Expression Expression Mode: Verbal Expression: 6-Expresses complex ideas: With extra time/assistive device  Social Interaction Social Interaction: 6-Interacts  appropriately with others with medication or extra time (anti-anxiety, antidepressant).  Problem Solving Problem Solving: 6-Solves complex problems: With extra time  Memory Memory: 6-More than reasonable amt of time  Medical Problem List and Plan: 1. Functional deficits secondary to R-BKA, also severely deconditioned 2. DVT Prophylaxis/Anticoagulation: Pharmaceutical: Lovenox 3. Pain Management: Premedicate prior to therapy sessions. --  oxycodone prn  -increase gabapentin to 300mg  tid   4. Mood: team to provide ego support. Added lexapro for mood stabilization. LCSW to follow for evaluation and support.  .  5. Neuropsych: This patient is capable of making decisions on his own behalf. 6. Skin/Wound Care: Daily dressing changes to left buttock wound. Routine pressure relief measures. Monitor wound daily for healing. Maintain adequate nutrition. .   -assured pt that some s/s drainage is to be expected from wound  -afebrile  -Ask biotech for new stump shrinkers 7. Fluids/Electrolytes/Nutrition: Monitor I/O. Added protein supplements to help with low protein stores.   -dc megace.    -RD consult 8. Reactive leucocytosis: Resolving . Monitor wound and temperature curve daily. Low grade temp still  9. DM type 2:  stopped lantus---  70/30 insulin increased to 10u  -intake improving--adjust insulin as needed    10. CKD: Monitor renal status closely as on IV Vancomycin. Avoid other nephrotoxic drugs.   11. HTN: Monitor BP tid on norvasc. Off prinizide and lasix due to AKI on CKD. Baseline Cr- 1.78 but is stabilizing to 2.3 range.   -increased coreg to 12.5mg  bid 12. Acute on chronic anemia: Low iron stores with high ferritin levels.  Transfused  2 units yesterday  -stool ob neg.  13. Left buttock wound: S/p I & D 12/14--antibiotics complete  14. Resting tachycardia: bb increased 15.  Cough and LUL infiltrate on Xray, will do sputum cult then initiate empiric Zosyn  -improved  HR       LOS (Days) 22 A FACE TO FACE EVALUATION WAS PERFORMED  KIRSTEINS,ANDREW E 01/17/2014, 9:42 AM

## 2014-01-17 NOTE — Progress Notes (Signed)
ANTICOAGULATION CONSULT NOTE - Follow Up Consult  Pharmacy Consult for coumadin, lovenox, and Zosyn Indication: hx of DVT/PE and HCAP  No Known Allergies  Patient Measurements: Weight: (!) 365 lb (165.563 kg) (pt didn't want me to take pillows off bed) Heparin Dosing Weight:   Vital Signs: Temp: 98.6 F (37 C) (01/09 0500) Temp Source: Oral (01/09 0500) BP: 141/72 mmHg (01/09 0500) Pulse Rate: 101 (01/09 0500)  Labs:  Recent Labs  01/15/14 0416 01/16/14 0315 01/17/14 0556  HGB 6.9*  --   --   HCT 21.2*  --   --   PLT 319  --   --   LABPROT 19.5* 22.8* 22.8*  INR 1.64* 1.99* 1.99*    Estimated Creatinine Clearance: 78.7 mL/min (by C-G formula based on Cr of 2.1).   Medications:  Scheduled:  . amLODipine  10 mg Oral Daily  . calcium carbonate  1 tablet Oral TID WC  . carvedilol  12.5 mg Oral BID WC  . enoxaparin (LOVENOX) injection  150 mg Subcutaneous Q12H  . escitalopram  20 mg Oral QHS  . feeding supplement (GLUCERNA SHAKE)  237 mL Oral BID BM  . furosemide  20 mg Oral Daily  . gabapentin  300 mg Oral TID  . insulin aspart  0-15 Units Subcutaneous TID WC  . insulin aspart protamine- aspart  10 Units Subcutaneous BID WC  . multivitamin with minerals  1 tablet Oral Daily  . pantoprazole  40 mg Oral BID  . piperacillin-tazobactam (ZOSYN)  IV  3.375 g Intravenous 3 times per day  . pravastatin  20 mg Oral q1800  . senna-docusate  2 tablet Oral BID  . sorbitol  45 mL Oral Once  . tamsulosin  0.4 mg Oral QPC supper  . Warfarin - Pharmacist Dosing Inpatient   Does not apply q1800   Infusions:    Assessment: 39 yo male with hx of DVT/PE is currently on subtherapeutic coumadin bridging with treatment dose of lovenox.  INR today 1.99.  No bleeding noted. Zosyn to start for possible HCAP  Goal of Therapy:  Anti-Xa level 0.6-1 units/ml 4hrs after LMWH dose given; INR 2-3 Monitor platelets by anticoagulation protocol: Yes   Plan:  - Coumadin 10 mg po today (may  need 10mg  most days and 7.5 mg few days a week) and cont lovenox 150 mg sq q12h until INR >/= 2 - Cont daily INR and CBC every 72 hours (monitor for bleeding) - Zosyn 3.375g IV q8h (infuse over 4 hours)   Candie Mile 01/17/2014,10:30 AM

## 2014-01-17 NOTE — Progress Notes (Addendum)
Occupational Therapy Session Note  Patient Details  Name: [name] MRN: [id number] Date of Birth: Jul 30, 1975  Today's Date: 01/17/2014 OT Individual Time:  -    1030-1140  (30  Min)       Short Term Goals: Week 1:  OT Short Term Goal 1 (Week 1): Pt will complete bathing with mod assist at sit > stand level OT Short Term Goal 1 - Progress (Week 1): Revised due to lack of progress OT Short Term Goal 2 (Week 1): Pt will complete LB dressing with mod assist at sit > stand level OT Short Term Goal 2 - Progress (Week 1): Revised due to lack of progress OT Short Term Goal 3 (Week 1): Pt will complete toilet transfer with max assist of 1 caregiver OT Short Term Goal 3 - Progress (Week 1): Revised due to lack of progress OT Short Term Goal 4 (Week 1): Pt will tolerate out of bed activity for full 60 minute session with no episodes of nausea or vomiting OT Short Term Goal 4 - Progress (Week 1): Met Week 2:  OT Short Term Goal 1 (Week 2): Pt will complete bathing at edge of bed and supine with min assist. OT Short Term Goal 1 - Progress (Week 2): Met OT Short Term Goal 2 (Week 2): Pt will complete transfer from w/c to [facility] with steadying assist OT Short Term Goal 2 - Progress (Week 2): Met OT Short Term Goal 3 (Week 2): Pt will don left sock and shoe using AE independently OT Short Term Goal 3 - Progress (Week 2): Partly met OT Short Term Goal 4 (Week 2): Pt will demo ability to complete simple meal prep at w/c level with setup assist OT Short Term Goal 4 - Progress (Week 2): Met OT Short Term Goal 5 (Week 2): Pt will complete UE HEP with supervision OT Short Term Goal 5 - Progress (Week 2): Progressing toward goal Week 3:  OT Short Term Goal 1 (Week 3): STG=LTG due to short remaining LOS  Skilled Therapeutic Interventions/Progress Updates:    Pt. Lying in bed.  Focus on transfers, endurance, sit to stand and initiation of tasks.  Pt required increased time to get started this AM.  Has a cough  and  Nausea.  Pt. Went from supine to sit after 15 minutes of encouragement initiation of task.  Pt. Transferred to wc using squat transfer after stating he was going to do a stand pivot.  He as supervision with this.  Transferred to shower bench with with SBA and squat pivot technique and grab bar.  Pt bathed unassisted.  Transferred back to wc and assist to pull pants over hips due to limited time.  Pt left in wc with all needs in reach.   .    Therapy Documentation Precautions:  Precautions Precautions: Fall Precaution Comments: New Rt BKA, with protector Required Braces or Orthoses: Knee Immobilizer - Right Restrictions Weight Bearing Restrictions: Yes RLE Weight Bearing: Non weight bearing Other Position/Activity Restrictions: new right BKA    VPain: Pain Assessment Pain Assessment: 0-10 Pain Score:5  Pain Type: Pain Location: chest Pain Orientation:  Pain Descriptors / Indicators: Constant;Aching Pain Frequency: Constant Pain Onset: On-going Pain Intervention(s): Medication (See eMAR);Repositioned    See FIM for current functional status  Therapy/Group: Individual Therapy  Lisa Roca 01/17/2014, 8:28 AM

## 2014-01-17 NOTE — Plan of Care (Signed)
Problem: RH BOWEL ELIMINATION Goal: RH STG MANAGE BOWEL W/MEDICATION W/ASSISTANCE STG Manage Bowel with Medication with min Assistance.  Outcome: Progressing LBM 01/16/2014

## 2014-01-18 ENCOUNTER — Inpatient Hospital Stay (HOSPITAL_COMMUNITY): Payer: Medicaid Other

## 2014-01-18 LAB — PROTIME-INR
INR: 2.65 — ABNORMAL HIGH (ref 0.00–1.49)
Prothrombin Time: 28.5 seconds — ABNORMAL HIGH (ref 11.6–15.2)

## 2014-01-18 LAB — GLUCOSE, CAPILLARY
Glucose-Capillary: 125 mg/dL — ABNORMAL HIGH (ref 70–99)
Glucose-Capillary: 86 mg/dL (ref 70–99)
Glucose-Capillary: 149 mg/dL — ABNORMAL HIGH (ref 70–99)
Glucose-Capillary: 130 mg/dL — ABNORMAL HIGH (ref 70–99)

## 2014-01-18 LAB — CBC
HEMATOCRIT: 29 % — AB (ref 39.0–52.0)
Hemoglobin: 9.4 g/dL — ABNORMAL LOW (ref 13.0–17.0)
MCH: 27 pg (ref 26.0–34.0)
MCHC: 32.4 g/dL (ref 30.0–36.0)
MCV: 83.3 fL (ref 78.0–100.0)
Platelets: 456 10*3/uL — ABNORMAL HIGH (ref 150–400)
RBC: 3.48 MIL/uL — AB (ref 4.22–5.81)
RDW: 14.5 % (ref 11.5–15.5)
WBC: 9.2 10*3/uL (ref 4.0–10.5)

## 2014-01-18 MED ORDER — WARFARIN SODIUM 7.5 MG PO TABS
7.5000 mg | ORAL_TABLET | Freq: Once | ORAL | Status: AC
  Administered 2014-01-18: 7.5 mg via ORAL
  Filled 2014-01-18: qty 1

## 2014-01-18 NOTE — Progress Notes (Signed)
Physical Therapy Session Note  Patient Details  Name: Salathiel Ransier MRN: [id number] Date of Birth: 12-17-1975  Today's Date: 01/18/2014 PT Individual Time: 1300-1330 PT Individual Time Calculation (min): 30 min   Session 2 Time: 1545-1630 Time Calculation (min): 45  Short Term Goals: Week 3:  PT Short Term Goal 1 (Week 3): STGs = LTGs due to ELOS  Skilled Therapeutic Interventions/Progress Updates:    Session 1: Pt received supine in bed, agreeable to participate in therapy. Pt reported feeling "like crap" but he was willing to get up and work. Pt donned shorts with set up assist and rolled L/R and moved supine>sit w/ mod (I). After using urinal and donning shorts, remainder of session focused on bed mobility. Pt scooted L/R along EOB with emphasis on anterior lean and achieving adequate clearance under buttocks. Noted pt with increased breath sounds from last time this therapist treated him, pt encouraged to use chair position in bed to increase respiratory strength. Therapist placed bed into chair position and pt left seated EOB w/ friend present and all needs within reach.  Session 2: Pt received seated in w/c in family room, agreeable to participate in therapy. Session focused on functional endurance, car transfer. Pt propelled w/c 150' w/ extra time to central elevators and managed w/c on/off elevator w/ ModA for positioning due to extended leg rests. Due to pt's height, body habitus, and decreased strength recommend pt ride in brother's car, which has higher ground to seat height than pt's girlfriend's car Pt performed stand pivot transfer w/ bari RW and heavy ModA w/c>car, then managed legs into car w/ mod (I). When going to transfer out of the car, pt had difficulty moving sit>stand from seat due to lack of places to push up with L hand. Suspect pt may perform better if sitting in back seat behind driver's seat and is able to push up w/ L hand on chair back. Pt began coughing heavily and  requested to perform slide board transfer to w/c instead. TotalA to place board, and close CGA to guard pt during transfer as pt was at risk to slide forward on board. Pt very fatigued after car transfer and required Max-TotalA to propel w/c back to rehab unit. Pt performed scoot transfer w/c>bed w/ CGA and moved sit>supine w/ mod (I). Pt left    Therapy Documentation Precautions:  Precautions Precautions: Fall Precaution Comments: New Rt BKA, with protector Required Braces or Orthoses: Knee Immobilizer - Right Restrictions Weight Bearing Restrictions: Yes RLE Weight Bearing: Non weight bearing Other Position/Activity Restrictions: new right BKA Pain:  Phantom pain during second session, not rated but pt noted to be wincing with all mobility. Pt allowed to rest and repositioned leg for comfort  See FIM for current functional status  Therapy/Group: Individual Therapy  [name]  [name], PT, DPT 01/18/2014, 7:27 AM

## 2014-01-18 NOTE — Progress Notes (Signed)
ANTICOAGULATION CONSULT NOTE - Follow Up Consult  Pharmacy Consult for coumadin and Zosyn Indication: hx of DVT/PE and HCAP  No Known Allergies  Patient Measurements: Weight: (!) 365 lb (165.563 kg) (pt didn't want me to take pillows off bed) Heparin Dosing Weight:   Vital Signs: Temp: 99.5 F (37.5 C) (01/10 0644) Temp Source: Oral (01/10 0644) BP: 154/86 mmHg (01/10 0644) Pulse Rate: 99 (01/10 0644)  Labs:  Recent Labs  01/16/14 0315 01/17/14 0556 01/18/14 0639  HGB  --   --  9.4*  HCT  --   --  29.0*  PLT  --   --  456*  LABPROT 22.8* 22.8* 28.5*  INR 1.99* 1.99* 2.65*    Estimated Creatinine Clearance: 78.7 mL/min (by C-G formula based on Cr of 2.1).   Medications:  Scheduled:  . amLODipine  10 mg Oral Daily  . calcium carbonate  1 tablet Oral TID WC  . carvedilol  12.5 mg Oral BID WC  . escitalopram  20 mg Oral QHS  . feeding supplement (GLUCERNA SHAKE)  237 mL Oral BID BM  . furosemide  20 mg Oral Daily  . gabapentin  300 mg Oral TID  . insulin aspart  0-15 Units Subcutaneous TID WC  . insulin aspart protamine- aspart  10 Units Subcutaneous BID WC  . multivitamin with minerals  1 tablet Oral Daily  . pantoprazole  40 mg Oral BID  . piperacillin-tazobactam (ZOSYN)  IV  3.375 g Intravenous 3 times per day  . pravastatin  20 mg Oral q1800  . senna-docusate  2 tablet Oral BID  . sorbitol  45 mL Oral Once  . tamsulosin  0.4 mg Oral QPC supper  . Warfarin - Pharmacist Dosing Inpatient   Does not apply q1800   Infusions:    Assessment: 39 yo male with hx of DVT/PE is currently on coumadin bridging with treatment dose of lovenox.  INR today 2.65.  No bleeding noted. Zosyn started 1/9 for possible HCAP.  Patient is afebrile, wbc 9.2.  Goal of Therapy:  INR 2-3 Monitor platelets by anticoagulation protocol: Yes   Plan:  - Coumadin 7.5 mg po today (may need 10mg  most days and 7.5 mg few days a week) and stop lovenox 150 mg sq q12h - Cont daily INR  (monitor for bleeding) - Zosyn 3.375g IV q8h (infuse over 4 hours)   Judie Bonus, Gearldine Bienenstock 01/18/2014,1:17 PM

## 2014-01-18 NOTE — Progress Notes (Signed)
Subjective/Complaints:  No coughing or shortness of breath today. Has nausea and did not eat breakfast. Low-grade temp Review of Systems - Negative except ccoughing , no abdominal pain diarrhea or constipation    Objective: Vital Signs: Blood pressure 154/86, pulse 99, temperature 99.5 F (37.5 C), temperature source Oral, resp. rate 20, weight 165.563 kg (365 lb), SpO2 95 %. Dg Chest 2 View  01/16/2014   CLINICAL DATA:  Shortness of breath, cough  EXAM: CHEST  2 VIEW  COMPARISON:  12/16/2013  FINDINGS: Low lung volumes. Mild patchy left lower lobe opacity, suspicious for pneumonia. No pleural effusion or pneumothorax.  Mild cardiomegaly.  Mild degenerative changes of the visualized thoracolumbar spine.  IMPRESSION: Mild patchy left upper lobe opacity, suspicious for pneumonia.   Electronically Signed   By: Julian Hy M.D.   On: 01/16/2014 22:03   Results for orders placed or performed during the hospital encounter of 12/26/13 (from the past 72 hour(s))  Prepare RBC     Status: None   Collection Time: 01/15/14  9:55 AM  Result Value Ref Range   Order Confirmation ORDER PROCESSED BY BLOOD BANK   Type and screen     Status: None   Collection Time: 01/15/14  9:55 AM  Result Value Ref Range   ABO/RH(D) B POS    Antibody Screen NEG    Sample Expiration 01/18/2014    Unit Number [id number]    Blood Component Type RED CELLS,LR    Unit division 00    Status of Unit ISSUED,FINAL    Transfusion Status OK TO TRANSFUSE    Crossmatch Result Compatible    Unit Number [id number]    Blood Component Type RED CELLS,LR    Unit division 00    Status of Unit ISSUED,FINAL    Transfusion Status OK TO TRANSFUSE    Crossmatch Result Compatible   Glucose, capillary     Status: Abnormal   Collection Time: 01/15/14 11:20 AM  Result Value Ref Range   Glucose-Capillary 147 (H) 70 - 99 mg/dL  Glucose, capillary     Status: Abnormal   Collection Time: 01/15/14  4:28 PM  Result Value Ref  Range   Glucose-Capillary 187 (H) 70 - 99 mg/dL  Glucose, capillary     Status: Abnormal   Collection Time: 01/15/14  8:44 PM  Result Value Ref Range   Glucose-Capillary 148 (H) 70 - 99 mg/dL  Protime-INR     Status: Abnormal   Collection Time: 01/16/14  3:15 AM  Result Value Ref Range   Prothrombin Time 22.8 (H) 11.6 - 15.2 seconds   INR 1.99 (H) 0.00 - 1.49  Glucose, capillary     Status: Abnormal   Collection Time: 01/16/14  6:41 AM  Result Value Ref Range   Glucose-Capillary 161 (H) 70 - 99 mg/dL  Occult blood card to lab, stool RN will collect     Status: None   Collection Time: 01/16/14  6:46 AM  Result Value Ref Range   Fecal Occult Bld NEGATIVE NEGATIVE  Glucose, capillary     Status: Abnormal   Collection Time: 01/16/14 12:01 PM  Result Value Ref Range   Glucose-Capillary 133 (H) 70 - 99 mg/dL  Glucose, capillary     Status: Abnormal   Collection Time: 01/16/14  4:38 PM  Result Value Ref Range   Glucose-Capillary 147 (H) 70 - 99 mg/dL  Glucose, capillary     Status: Abnormal   Collection Time: 01/16/14  8:49 PM  Result Value  Ref Range   Glucose-Capillary 121 (H) 70 - 99 mg/dL  Protime-INR     Status: Abnormal   Collection Time: 01/17/14  5:56 AM  Result Value Ref Range   Prothrombin Time 22.8 (H) 11.6 - 15.2 seconds   INR 1.99 (H) 0.00 - 1.49  Glucose, capillary     Status: Abnormal   Collection Time: 01/17/14  6:51 AM  Result Value Ref Range   Glucose-Capillary 197 (H) 70 - 99 mg/dL  Glucose, capillary     Status: Abnormal   Collection Time: 01/17/14 12:08 PM  Result Value Ref Range   Glucose-Capillary 144 (H) 70 - 99 mg/dL  Glucose, capillary     Status: Abnormal   Collection Time: 01/17/14  4:50 PM  Result Value Ref Range   Glucose-Capillary 119 (H) 70 - 99 mg/dL  Glucose, capillary     Status: None   Collection Time: 01/17/14  9:04 PM  Result Value Ref Range   Glucose-Capillary 98 70 - 99 mg/dL  Protime-INR     Status: Abnormal   Collection Time:  01/18/14  6:39 AM  Result Value Ref Range   Prothrombin Time 28.5 (H) 11.6 - 15.2 seconds   INR 2.65 (H) 0.00 - 1.49  CBC     Status: Abnormal   Collection Time: 01/18/14  6:39 AM  Result Value Ref Range   WBC 9.2 4.0 - 10.5 K/uL   RBC 3.48 (L) 4.22 - 5.81 MIL/uL   Hemoglobin 9.4 (L) 13.0 - 17.0 g/dL   HCT 29.0 (L) 39.0 - 52.0 %   MCV 83.3 78.0 - 100.0 fL   MCH 27.0 26.0 - 34.0 pg   MCHC 32.4 30.0 - 36.0 g/dL   RDW 14.5 11.5 - 15.5 %   Platelets 456 (H) 150 - 400 K/uL  Glucose, capillary     Status: Abnormal   Collection Time: 01/18/14  6:41 AM  Result Value Ref Range   Glucose-Capillary 125 (H) 70 - 99 mg/dL     HEENT: normal Cardio: RRR and no murmurs Resp: CTA B/L and unlabored GI: BS positive and nontender nondistended Extremity:  Edema left stump Skin:   Wound clean with minimal drainage from buttock--iodoform. Right stump with minimal to no drainage. Neuro: Alert/Oriented and Abnormal Motor decreased left knee flexion extension inhibited by pain Musc/Skel:  Right leg appropriately tender. Gen NAD   Assessment/Plan: 1. Functional deficits secondary to Right BKA which require 3+ hours per day of interdisciplinary therapy in a comprehensive inpatient rehab setting. Physiatrist is providing close team supervision and 24 hour management of active medical problems listed below. Physiatrist and rehab team continue to assess barriers to discharge/monitor patient progress toward functional and medical goals. FIM: FIM - Bathing Bathing Steps Patient Completed: Chest, Right Arm, Left Arm, Abdomen, Front perineal area, Buttocks, Right upper leg, Left upper leg, Left lower leg (including foot) Bathing: 5: Supervision: Safety issues/verbal cues  FIM - Upper Body Dressing/Undressing Upper body dressing/undressing steps patient completed: Thread/unthread right sleeve of pullover shirt/dresss, Thread/unthread left sleeve of pullover shirt/dress, Put head through opening of pull over  shirt/dress, Pull shirt over trunk Upper body dressing/undressing: 5: Set-up assist to: Obtain clothing/put away FIM - Lower Body Dressing/Undressing Lower body dressing/undressing steps patient completed: Thread/unthread right underwear leg, Thread/unthread left underwear leg, Pull underwear up/down, Thread/unthread right pants leg, Thread/unthread left pants leg, Pull pants up/down, Don/Doff left sock Lower body dressing/undressing: 4: Min-Patient completed 75 plus % of tasks  FIM - Toileting Toileting steps completed by  patient: Adjust clothing prior to toileting, Performs perineal hygiene Toileting Assistive Devices: Grab bar or rail for support Toileting: 3: Mod-Patient completed 2 of 3 steps  FIM - Radio producer Devices: Product manager Transfers: 0-Activity did not occur  FIM - Control and instrumentation engineer Devices: Arm rests, Bed rails Bed/Chair Transfer: 6: Supine > Sit: No assist, 5: Bed > Chair or W/C: Supervision (verbal cues/safety issues)  FIM - Locomotion: Wheelchair Distance: 150 Locomotion: Wheelchair: 6: Travels 150 ft or more, turns around, maneuvers to table, bed or toilet, negotiates 3% grade: maneuvers on rugs and over door sills independently FIM - Locomotion: Ambulation Locomotion: Ambulation Assistive Devices: Administrator Ambulation/Gait Assistance: 4: Min guard Locomotion: Ambulation: 0: Activity did not occur  Comprehension Comprehension Mode: Auditory Comprehension: 6-Follows complex conversation/direction: With extra time/assistive device  Expression Expression Mode: Verbal Expression: 6-Expresses complex ideas: With extra time/assistive device  Social Interaction Social Interaction: 6-Interacts appropriately with others with medication or extra time (anti-anxiety, antidepressant).  Problem Solving Problem Solving: 6-Solves complex problems: With extra time  Memory Memory: 6-More than  reasonable amt of time  Medical Problem List and Plan: 1. Functional deficits secondary to R-BKA, also severely deconditioned 2. DVT Prophylaxis/Anticoagulation: Pharmaceutical: Lovenox 3. Pain Management: Premedicate prior to therapy sessions. --  oxycodone prn  -increase gabapentin to 300mg  tid   4. Mood: team to provide ego support. Added lexapro for mood stabilization. LCSW to follow for evaluation and support.  .  5. Neuropsych: This patient is capable of making decisions on his own behalf. 6. Skin/Wound Care: Daily dressing changes to left buttock wound. Routine pressure relief measures. Monitor wound daily for healing. Maintain adequate nutrition. .   -assured pt that some s/s drainage is to be expected from wound  -afebrile  -Ask biotech for new stump shrinkers 7. Fluids/Electrolytes/Nutrition: Monitor I/O. Added protein supplements to help with low protein stores.   -dc megace.    -RD consult 8. Reactive leucocytosis: Resolving . Monitor wound and temperature curve daily. Low grade temp still  9. DM type 2:  stopped lantus---  70/30 insulin increased to 10u  -intake improving--adjust insulin as needed    10. CKD: Monitor renal status closely repeat BMET ordered 11. HTN: Monitor BP tid on norvasc. Off prinizide and lasix due to AKI on CKD. Baseline Cr- 1.78 but is stabilizing to 2.3 range.   -increased coreg to 12.5mg  bid 12. Acute on chronic anemia: Low iron stores with high ferritin levels.  Transfused  2 units yesterday  -stool ob neg.  13. Left buttock wound: S/p I & D 12/14--antibiotics complete  14. Resting tachycardia: bb increased 15.  Probable nosocomial pneumonia LUL infiltrate on Xray, white count normal, sputum culture pending, on empiric Zosyn, symptomatically improving        LOS (Days) 23 A FACE TO FACE EVALUATION WAS PERFORMED  KIRSTEINS,ANDREW E 01/18/2014, 9:38 AM

## 2014-01-19 ENCOUNTER — Inpatient Hospital Stay (HOSPITAL_COMMUNITY): Payer: Self-pay

## 2014-01-19 ENCOUNTER — Inpatient Hospital Stay (HOSPITAL_COMMUNITY): Payer: Medicaid Other

## 2014-01-19 LAB — GLUCOSE, CAPILLARY
GLUCOSE-CAPILLARY: 130 mg/dL — AB (ref 70–99)
Glucose-Capillary: 141 mg/dL — ABNORMAL HIGH (ref 70–99)
Glucose-Capillary: 137 mg/dL — ABNORMAL HIGH (ref 70–99)
Glucose-Capillary: 124 mg/dL — ABNORMAL HIGH (ref 70–99)

## 2014-01-19 LAB — PROTIME-INR
INR: 3.48 — AB (ref 0.00–1.49)
PROTHROMBIN TIME: 35.2 s — AB (ref 11.6–15.2)

## 2014-01-19 MED ORDER — NORTRIPTYLINE HCL 10 MG PO CAPS
10.0000 mg | ORAL_CAPSULE | Freq: Every day | ORAL | Status: DC
  Filled 2014-01-19: qty 1

## 2014-01-19 MED ORDER — NORTRIPTYLINE HCL 10 MG PO CAPS
10.0000 mg | ORAL_CAPSULE | Freq: Every day | ORAL | Status: DC
  Administered 2014-01-19 – 2014-01-20 (×2): 10 mg via ORAL
  Filled 2014-01-19 (×2): qty 1

## 2014-01-19 MED ORDER — WARFARIN SODIUM 5 MG PO TABS
5.0000 mg | ORAL_TABLET | Freq: Once | ORAL | Status: AC
  Administered 2014-01-19: 5 mg via ORAL
  Filled 2014-01-19: qty 1

## 2014-01-19 NOTE — Progress Notes (Signed)
Physical Therapy Make up Session Note  Patient Details  Name: [name] MRN: [id number] Date of Birth: 1975/04/10  Today's Date: 01/19/2014 PT Individual Time: 1400 (make up session)-1510 PT Individual Time Calculation (min): 70 min   Short Term Goals: Week 4:     Skilled Therapeutic Interventions/Progress Updates:   Pt received sitting on therapy mat in gym, working on mirror therapy with PT.  Pt continued with this PT in order to continue to work on mirror therapy to decrease phantom pain in RLE.  Performed ankle pumps x 20 reps, heel slides x 15 reps, min A SLR x 10 reps.  Progressed to active therex of RLE in SL with R LE hip abd x 15 reps, SL hip ext x 15 reps, and SL hip flex x 2 sets of 15 reps for increased ROM and strengthening.  Pt transitioned back to sitting on EOM and transferred to w/c at S level via lateral scoot transfer.  Min cues for safety.  Propelled to arm bike and performed 5 mins total (half forward and half backwards) at level 5 resistance.  Tolerated well.  Pt self propelled to ADL apt in order to perform functional transfers, however pt stated that his phantom pain was increasing.  Discussed and education on limb wrapping and if he had been doing it on his own at all.  He states no but "I think I got it."  Had pt attempt to self wrap limb, however requires max to total [facility] assist for limb wrapping.   Provided education on importance of avoiding circular wraps and need to perform often throughout the day.  Also re-educated and performed light desensitization over limb without ace wrap.  Pt propelled back to room >150' at S level with BUEs for overall strengthening and endurance.  Transferred back to bed as stated above.  Left with all needs in reach.  RN made aware of removal of ace wrap for continued desensitization and increased pain in R residual limb.   Therapy Documentation Precautions:  Precautions Precautions: Fall Precaution Comments: New Rt BKA, with  protector Required Braces or Orthoses: Knee Immobilizer - Right Restrictions Weight Bearing Restrictions: Yes RLE Weight Bearing: Non weight bearing Other Position/Activity Restrictions: new right BKA   Pain: Max c/o phantom pain during session, provided desensitization techniques and mirror therapy.    Locomotion : Wheelchair Mobility Distance: 150   See FIM for current functional status  Therapy/Group: Individual Therapy  Denice Bors 01/19/2014, 4:19 PM

## 2014-01-19 NOTE — Progress Notes (Signed)
Occupational Therapy Session Note  Patient Details  Name: [name] MRN: [id number] Date of Birth: 09-14-1975  Today's Date: 01/19/2014 OT Individual Time: [id number] OT Individual Time Calculation (min): 50 min    Short Term Goals: Week 3:  OT Short Term Goal 1 (Week 3): STG=LTG due to short remaining LOS  Skilled Therapeutic Interventions/Progress Updates: ADL-retraining with emphasis on bed level bathing/dressing skills.   RN reported presence of IV for session with precaution to maintain integrity during ADL.    Pt was advised to resume prior practice of bed level bathing/dressing skills using pan seated at edge of bed for upper body and supine for lower body (buttocks).    With extra time pt progressed through session with overall supervision and setup assist although repeating c/o severe phantom pain at right residual limb.   Pt completed dressing and remained in bed and offered plan to decline scheduled physical therapy session d/t severe pain.   Session terminated 10 min early d/t unresolved pain and pt request for bed rest.   PA advised on change in performance of BADL d/t pain.    Therapy Documentation Precautions:  Precautions Precautions: Fall Precaution Comments: New Rt BKA, with protector Required Braces or Orthoses: Knee Immobilizer - Right Restrictions Weight Bearing Restrictions: Yes RLE Weight Bearing: Non weight bearing Other Position/Activity Restrictions: new right BKA   General: General OT Amount of Missed Time: 10 Minutes  Vital Signs: Therapy Vitals Pulse Rate: (!) 108 BP: (!) 156/79 mmHg Patient Position (if appropriate): Lying   Pain: Pain Assessment Pain Assessment: 0-10 Pain Score: 10-Worst pain ever Pain Type: Phantom pain Pain Location: Leg Pain Orientation: Right;Lower Pain Onset: On-going Pain Intervention(s): Medication (See eMAR);Elevated extremity;Distraction  See FIM for current functional status  Therapy/Group: Individual Therapy    Second session: Time: 1330-1350 Time Calculation (min):  20 min  Pain Assessment: 10/10, R-LE, phantom pain  Skilled Therapeutic Interventions:  Therapeutic activity with emphasis on patient education with re-education on pain management and desensitization to right residual limb.   Pt received side-lying in bed with food tray present.   Pt was occupied with cell phone but responded to therapist inquiring on potential change in status.   Pt reiterated deference to plan for therapy (UE strengthening) d/t unmanaged pain however agreed to clarification and instruction on discharge status and use of compression stocking to residual limb.  Pt educated by therapist and PA-C on need to adjust wear schedule to gradually increase tolerance to compression stocking if he is unable to endure wearing it 23/7. Pt was also re-educated on use of massage and modalities (ice) to enhance desensitization and advised to participate with physical therapist who attempted to train patient using mirror therapy technique for pain management.  OT advised pt to continue to plan for discharge 01/20/14 as scheduled to which pt replied that he did not feel prepared for discharge.    Pt left in bed with ice pack present for use after PA inspected right residual limb.     See FIM for current functional status  Therapy/Group: Individual Therapy  [name] 01/19/2014, 9:29 AM

## 2014-01-19 NOTE — Progress Notes (Signed)
Physical Therapy Note  Patient Details  Name: Nooh Markunas MRN: [id number] Date of Birth: Jun 03, 1975 Today's Date: 01/19/2014  Attempted to make up time with patient, however, patient reporting he still does not feel well and does not want to participate. Educated patient about importance of moving and OOB activity with pneumonia and patient reports he plans on participating in afternoon sessions. Will follow up as able.  [place] Lind Ausley, PT, DPT 01/19/2014, 11:24 AM

## 2014-01-19 NOTE — Progress Notes (Signed)
Pt did not sleep all night due to phantom pain. Administered every pain medication that he had available and he reported pain a 10/10 consistently. Administered cough medicine, trazadone 50mg , ultram, oxy, and robaxin throughout the night. Bloood pressure this am is 173/86. Will continue to monitor patient. Kennieth Francois, RN

## 2014-01-19 NOTE — Progress Notes (Signed)
Physical Therapy Note  Patient Details  Name: [name] MRN: [id number] Date of Birth: October 05, 1975 Today's Date: 01/19/2014    Pt seen in AM prone in bed. Pt declined participation in therapy this AM due to significant phantom pain in R limb rated at 13 out of 10. Offered pt session focusing on mirror therapy to address phantom pain, but pt declined. PA [name] aware of pt's significant pain this morning. Pt missed 60 minutes scheduled PT. Will follow up per plan of care.   [name] 01/19/2014, 10:48 AM

## 2014-01-19 NOTE — Plan of Care (Signed)
Problem: RH PAIN MANAGEMENT Goal: RH STG PAIN MANAGED AT OR BELOW PT'S PAIN GOAL Less than 3 out of 10  Outcome: Not Progressing Pts pain has been uncontrolled during my shift. He complains of Phantom pain at a 10/10.

## 2014-01-19 NOTE — Progress Notes (Addendum)
Physical Therapy Session Note  Patient Details  Name: [name] MRN: [id number] Date of Birth: 31-May-1975  Today's Date: 01/19/2014 PT Individual Time: 1430-1500 PT Individual Time Calculation (min): 30 min    Short Term Goals: Week 3:  PT Short Term Goal 1 (Week 3): STGs = LTGs due to ELOS  Skilled Therapeutic Interventions/Progress Updates:    Pt received supine in bed, agreeable to participate in therapy despite ongoing phantom pain rated at 10/10. Pt moved supine>sit w/ overall S, then transferred bed>w/c w/ RW on second attempt to stand, w/ overall close SBA. Pt propelled w/c w/ SBA and extra time w/ assist to manage IV pole. In rehab gym pt transferred w/c> mat table w/ slide board and overall SBA. Pt engaged in mirror therapy for R phantom pain w/ AROM at L ankle and L knee, with pt reporting decrease in phantom pain to 9/10 after 10 minutes of mirror therapy. Pt handed off to second PT for make up session.    Therapy Documentation Precautions:  Precautions Precautions: Fall Precaution Comments: New Rt BKA, with protector Required Braces or Orthoses: Knee Immobilizer - Right Restrictions Weight Bearing Restrictions: Yes RLE Weight Bearing: Non weight bearing Other Position/Activity Restrictions: new right BKA Pain: Pain Assessment Pain Assessment: 0-10 Pain Score: 9  Pain Location: Leg Pain Orientation: Right Pain Descriptors / Indicators: Throbbing Pain Onset: On-going Pain Intervention(s): Medication (See eMAR)  See FIM for current functional status  Therapy/Group: Individual Therapy  [name]  [name], PT, DPT 01/19/2014, 5:16 PM

## 2014-01-19 NOTE — Progress Notes (Signed)
Subjective/Complaints:  Had a rough night. A lot of phantom pain. Didn't sleep well.  Review of Systems - Negative except ccoughing , no abdominal pain diarrhea or constipation    Objective: Vital Signs: Blood pressure 170/76, pulse 109, temperature 98.6 F (37 C), temperature source Oral, resp. rate 19, weight 155.221 kg (342 lb 3.2 oz), SpO2 93 %. No results found. Results for orders placed or performed during the hospital encounter of 12/26/13 (from the past 72 hour(s))  Glucose, capillary     Status: Abnormal   Collection Time: 01/16/14 12:01 PM  Result Value Ref Range   Glucose-Capillary 133 (H) 70 - 99 mg/dL  Glucose, capillary     Status: Abnormal   Collection Time: 01/16/14  4:38 PM  Result Value Ref Range   Glucose-Capillary 147 (H) 70 - 99 mg/dL  Glucose, capillary     Status: Abnormal   Collection Time: 01/16/14  8:49 PM  Result Value Ref Range   Glucose-Capillary 121 (H) 70 - 99 mg/dL  Protime-INR     Status: Abnormal   Collection Time: 01/17/14  5:56 AM  Result Value Ref Range   Prothrombin Time 22.8 (H) 11.6 - 15.2 seconds   INR 1.99 (H) 0.00 - 1.49  Glucose, capillary     Status: Abnormal   Collection Time: 01/17/14  6:51 AM  Result Value Ref Range   Glucose-Capillary 197 (H) 70 - 99 mg/dL  Glucose, capillary     Status: Abnormal   Collection Time: 01/17/14 12:08 PM  Result Value Ref Range   Glucose-Capillary 144 (H) 70 - 99 mg/dL  Glucose, capillary     Status: Abnormal   Collection Time: 01/17/14  4:50 PM  Result Value Ref Range   Glucose-Capillary 119 (H) 70 - 99 mg/dL  Glucose, capillary     Status: None   Collection Time: 01/17/14  9:04 PM  Result Value Ref Range   Glucose-Capillary 98 70 - 99 mg/dL  Protime-INR     Status: Abnormal   Collection Time: 01/18/14  6:39 AM  Result Value Ref Range   Prothrombin Time 28.5 (H) 11.6 - 15.2 seconds   INR 2.65 (H) 0.00 - 1.49  CBC     Status: Abnormal   Collection Time: 01/18/14  6:39 AM  Result  Value Ref Range   WBC 9.2 4.0 - 10.5 K/uL   RBC 3.48 (L) 4.22 - 5.81 MIL/uL   Hemoglobin 9.4 (L) 13.0 - 17.0 g/dL   HCT 29.0 (L) 39.0 - 52.0 %   MCV 83.3 78.0 - 100.0 fL   MCH 27.0 26.0 - 34.0 pg   MCHC 32.4 30.0 - 36.0 g/dL   RDW 14.5 11.5 - 15.5 %   Platelets 456 (H) 150 - 400 K/uL  Glucose, capillary     Status: Abnormal   Collection Time: 01/18/14  6:41 AM  Result Value Ref Range   Glucose-Capillary 125 (H) 70 - 99 mg/dL  Glucose, capillary     Status: None   Collection Time: 01/18/14 11:27 AM  Result Value Ref Range   Glucose-Capillary 86 70 - 99 mg/dL   Comment 1 Notify RN   Glucose, capillary     Status: Abnormal   Collection Time: 01/18/14  4:29 PM  Result Value Ref Range   Glucose-Capillary 149 (H) 70 - 99 mg/dL   Comment 1 Notify RN   Glucose, capillary     Status: Abnormal   Collection Time: 01/18/14  9:02 PM  Result Value Ref Range  Glucose-Capillary 130 (H) 70 - 99 mg/dL  Protime-INR     Status: Abnormal   Collection Time: 01/19/14  2:21 AM  Result Value Ref Range   Prothrombin Time 35.2 (H) 11.6 - 15.2 seconds   INR 3.48 (H) 0.00 - 1.49  Glucose, capillary     Status: Abnormal   Collection Time: 01/19/14  6:49 AM  Result Value Ref Range   Glucose-Capillary 141 (H) 70 - 99 mg/dL     HEENT: normal Cardio: RRR and no murmurs Resp: CTA B/L and unlabored GI: BS positive and nontender nondistended Extremity:  Edema left stump Skin:   Wound clean with minimal drainage from buttock--iodoform. Right stump with minimal to no drainage. Neuro: Alert/Oriented and Abnormal Motor decreased left knee flexion extension inhibited by pain Musc/Skel:  Right leg appropriately tender. Gen NAD   Assessment/Plan: 1. Functional deficits secondary to Right BKA which require 3+ hours per day of interdisciplinary therapy in a comprehensive inpatient rehab setting. Physiatrist is providing close team supervision and 24 hour management of active medical problems listed  below. Physiatrist and rehab team continue to assess barriers to discharge/monitor patient progress toward functional and medical goals. FIM: FIM - Bathing Bathing Steps Patient Completed: Chest, Right Arm, Left Arm, Abdomen, Front perineal area, Buttocks, Right upper leg, Left upper leg, Left lower leg (including foot) Bathing: 5: Supervision: Safety issues/verbal cues  FIM - Upper Body Dressing/Undressing Upper body dressing/undressing steps patient completed: Thread/unthread right sleeve of pullover shirt/dresss, Thread/unthread left sleeve of pullover shirt/dress, Put head through opening of pull over shirt/dress, Pull shirt over trunk Upper body dressing/undressing: 5: Set-up assist to: Obtain clothing/put away FIM - Lower Body Dressing/Undressing Lower body dressing/undressing steps patient completed: Thread/unthread right underwear leg, Thread/unthread left underwear leg, Pull underwear up/down, Thread/unthread right pants leg, Thread/unthread left pants leg, Pull pants up/down, Don/Doff left sock Lower body dressing/undressing: 4: Min-Patient completed 75 plus % of tasks  FIM - Toileting Toileting steps completed by patient: Adjust clothing prior to toileting, Performs perineal hygiene Toileting Assistive Devices: Grab bar or rail for support Toileting: 3: Mod-Patient completed 2 of 3 steps  FIM - Radio producer Devices: Product manager Transfers: 0-Activity did not occur  FIM - Control and instrumentation engineer Devices: Arm rests, Bed rails Bed/Chair Transfer: 6: Supine > Sit: No assist, 5: Bed > Chair or W/C: Supervision (verbal cues/safety issues)  FIM - Locomotion: Wheelchair Distance: 150 Locomotion: Wheelchair: 6: Travels 150 ft or more, turns around, maneuvers to table, bed or toilet, negotiates 3% grade: maneuvers on rugs and over door sills independently FIM - Locomotion: Ambulation Locomotion: Ambulation Assistive Devices:  Administrator Ambulation/Gait Assistance: 4: Min guard Locomotion: Ambulation: 0: Activity did not occur  Comprehension Comprehension Mode: Auditory Comprehension: 6-Follows complex conversation/direction: With extra time/assistive device  Expression Expression Mode: Verbal Expression: 6-Expresses complex ideas: With extra time/assistive device  Social Interaction Social Interaction: 6-Interacts appropriately with others with medication or extra time (anti-anxiety, antidepressant).  Problem Solving Problem Solving: 6-Solves complex problems: With extra time  Memory Memory: 7-Complete Independence: No helper  Medical Problem List and Plan: 1. Functional deficits secondary to R-BKA, also severely deconditioned 2. DVT Prophylaxis/Anticoagulation: Pharmaceutical: Lovenox 3. Pain Management: Premedicate prior to therapy sessions. --  oxycodone prn  -increase gabapentin to 300mg  tid on Friday  -add low dose pamelor for phantom limb pain 4. Mood: team to provide ego support. Added lexapro for mood stabilization. LCSW to follow for evaluation and support.  Marland Kitchen  5. Neuropsych: This patient is capable of making decisions on his own behalf. 6. Skin/Wound Care: Daily dressing changes to left buttock wound. Routine pressure relief measures. Monitor wound daily for healing. Maintain adequate nutrition. .   -assured pt that some s/s drainage is to be expected from wound  -afebrile  -needs stump shrinker for bk (delivered an ak shrinker) 7. Fluids/Electrolytes/Nutrition: Monitor I/O. Added protein supplements to help with low protein stores.   -dc megace.    -RD consult 8. Reactive leucocytosis: Resolving . Monitor wound and temperature curve daily. Low grade temp still  9. DM type 2:  stopped lantus---  70/30 insulin increased to 10u  -intake improving--adjust insulin as needed  -cbg's reasonable    10. CKD: Monitor renal status closely repeat BMET ordered 11. HTN: Monitor BP tid  on norvasc. Off prinizide and lasix due to AKI on CKD. Baseline Cr- 1.78 but is stabilizing to 2.3 range.   -increased coreg to 12.5mg  bid  -some increase today due to pain 12. Acute on chronic anemia: Low iron stores with high ferritin levels.  Transfused  2 units yesterday  -stool ob neg.  13. Left buttock wound: S/p I & D 12/14--antibiotics complete  14. Resting tachycardia: bb increased 15.  Probable nosocomial pneumonia LUL infiltrate on Xray, white count normal, sputum culture pending, on empiric Zosyn, symptomatically improving        LOS (Days) 24 A FACE TO FACE EVALUATION WAS PERFORMED  Sylvi Rybolt T 01/19/2014, 7:49 AM

## 2014-01-19 NOTE — Progress Notes (Signed)
ANTICOAGULATION CONSULT NOTE - Follow Up Consult  Pharmacy Consult for coumadin Indication: hx of DVT/PE  No Known Allergies  Patient Measurements: Weight: (!) 342 lb 3.2 oz (155.221 kg) Heparin Dosing Weight:   Vital Signs: Temp: 98.6 F (37 C) (01/11 0448) Temp Source: Oral (01/11 0448) BP: 170/76 mmHg (01/11 0656) Pulse Rate: 109 (01/11 0654)  Labs:  Recent Labs  01/17/14 0556 01/18/14 0639 01/19/14 0221  HGB  --  9.4*  --   HCT  --  29.0*  --   PLT  --  456*  --   LABPROT 22.8* 28.5* 35.2*  INR 1.99* 2.65* 3.48*    Estimated Creatinine Clearance: 75.9 mL/min (by C-G formula based on Cr of 2.1).   Medications:  Scheduled:  . amLODipine  10 mg Oral Daily  . calcium carbonate  1 tablet Oral TID WC  . carvedilol  12.5 mg Oral BID WC  . escitalopram  20 mg Oral QHS  . feeding supplement (GLUCERNA SHAKE)  237 mL Oral BID BM  . furosemide  20 mg Oral Daily  . gabapentin  300 mg Oral TID  . insulin aspart  0-15 Units Subcutaneous TID WC  . insulin aspart protamine- aspart  10 Units Subcutaneous BID WC  . multivitamin with minerals  1 tablet Oral Daily  . nortriptyline  10 mg Oral QHS  . pantoprazole  40 mg Oral BID  . piperacillin-tazobactam (ZOSYN)  IV  3.375 g Intravenous 3 times per day  . pravastatin  20 mg Oral q1800  . senna-docusate  2 tablet Oral BID  . sorbitol  45 mL Oral Once  . tamsulosin  0.4 mg Oral QPC supper  . Warfarin - Pharmacist Dosing Inpatient   Does not apply q1800   Infusions:    Assessment: 39 yo male with hx of DVT/PE is currently on supratherapeutic coumadin.  INR today is up to 3.48 from 2.65.  Patient's coumadin dosing has been hard to determine since he only required 0.5-1 mg at one point and now seems to require more.   Goal of Therapy:  INR 2-3 Monitor platelets by anticoagulation protocol: Yes   Plan:  - coumadin 5mg  po x1 - INR in am  Keyah Blizard, [name] 01/19/2014,8:18 AM

## 2014-01-19 NOTE — Progress Notes (Signed)
Dr. Naaman Plummer notified of pts blood pressure. Kennieth Francois, RN

## 2014-01-20 ENCOUNTER — Inpatient Hospital Stay (HOSPITAL_COMMUNITY): Payer: Medicaid Other

## 2014-01-20 DIAGNOSIS — J159 Unspecified bacterial pneumonia: Secondary | ICD-10-CM

## 2014-01-20 LAB — PROTIME-INR
INR: 4.25 — AB (ref 0.00–1.49)
PROTHROMBIN TIME: 41.2 s — AB (ref 11.6–15.2)

## 2014-01-20 LAB — GLUCOSE, CAPILLARY
GLUCOSE-CAPILLARY: 113 mg/dL — AB (ref 70–99)
Glucose-Capillary: 155 mg/dL — ABNORMAL HIGH (ref 70–99)
Glucose-Capillary: 169 mg/dL — ABNORMAL HIGH (ref 70–99)

## 2014-01-20 MED ORDER — PANTOPRAZOLE SODIUM 40 MG PO TBEC: 40.0000 mg | DELAYED_RELEASE_TABLET | Freq: Two times a day (BID) | ORAL | Status: DC

## 2014-01-20 MED ORDER — CARVEDILOL 12.5 MG PO TABS: 12.5000 mg | ORAL_TABLET | Freq: Two times a day (BID) | ORAL | Status: DC

## 2014-01-20 MED ORDER — GABAPENTIN 300 MG PO CAPS: 300.0000 mg | ORAL_CAPSULE | Freq: Three times a day (TID) | ORAL | Status: DC

## 2014-01-20 MED ORDER — ALPRAZOLAM 0.25 MG PO TABS: 0.2500 mg | ORAL_TABLET | Freq: Two times a day (BID) | ORAL | Status: DC | PRN

## 2014-01-20 MED ORDER — WARFARIN SODIUM 5 MG PO TABS: ORAL_TABLET | ORAL | Status: DC

## 2014-01-20 MED ORDER — SENNOSIDES-DOCUSATE SODIUM 8.6-50 MG PO TABS: 2.0000 | ORAL_TABLET | Freq: Two times a day (BID) | ORAL | Status: DC

## 2014-01-20 MED ORDER — NORTRIPTYLINE HCL 10 MG PO CAPS: 10.0000 mg | ORAL_CAPSULE | Freq: Every day | ORAL | Status: DC

## 2014-01-20 MED ORDER — AMLODIPINE BESYLATE 10 MG PO TABS: 10.0000 mg | ORAL_TABLET | Freq: Every day | ORAL | Status: DC

## 2014-01-20 MED ORDER — METHOCARBAMOL 500 MG PO TABS: 500.0000 mg | ORAL_TABLET | Freq: Four times a day (QID) | ORAL | Status: DC | PRN

## 2014-01-20 MED ORDER — LEVOFLOXACIN 250 MG PO TABS: 250.0000 mg | ORAL_TABLET | Freq: Every day | ORAL | Status: DC

## 2014-01-20 MED ORDER — DM-GUAIFENESIN ER 30-600 MG PO TB12
1.0000 | ORAL_TABLET | Freq: Two times a day (BID) | ORAL | Status: DC
  Administered 2014-01-20 (×2): 1 via ORAL
  Filled 2014-01-20 (×3): qty 1

## 2014-01-20 MED ORDER — OXYCODONE HCL 15 MG PO TABS: 15.0000 mg | ORAL_TABLET | Freq: Four times a day (QID) | ORAL | Status: DC | PRN

## 2014-01-20 MED ORDER — LEVOFLOXACIN 250 MG PO TABS
250.0000 mg | ORAL_TABLET | Freq: Every day | ORAL | Status: DC
  Administered 2014-01-20: 250 mg via ORAL
  Filled 2014-01-20 (×2): qty 1

## 2014-01-20 MED ORDER — ESCITALOPRAM OXALATE 20 MG PO TABS: 20.0000 mg | ORAL_TABLET | Freq: Every day | ORAL | Status: DC

## 2014-01-20 MED ORDER — TAMSULOSIN HCL 0.4 MG PO CAPS: 0.4000 mg | ORAL_CAPSULE | Freq: Every day | ORAL | Status: DC

## 2014-01-20 MED ORDER — ACETAMINOPHEN 325 MG PO TABS: 325.0000 mg | ORAL_TABLET | ORAL | Status: DC | PRN

## 2014-01-20 MED ORDER — FUROSEMIDE 40 MG PO TABS: 40.0000 mg | ORAL_TABLET | Freq: Every day | ORAL | Status: DC

## 2014-01-20 MED ORDER — INSULIN NPH ISOPHANE & REGULAR (70-30) 100 UNIT/ML ~~LOC~~ SUSP: 10.0000 [IU] | Freq: Two times a day (BID) | SUBCUTANEOUS | Status: DC

## 2014-01-20 MED ORDER — ADULT MULTIVITAMIN W/MINERALS CH: 1.0000 | ORAL_TABLET | Freq: Every day | ORAL | Status: DC

## 2014-01-20 MED ORDER — TRAMADOL HCL 50 MG PO TABS: 50.0000 mg | ORAL_TABLET | Freq: Two times a day (BID) | ORAL | Status: DC

## 2014-01-20 MED ORDER — CALCIUM CARBONATE ANTACID 500 MG PO CHEW: 1.0000 | CHEWABLE_TABLET | Freq: Three times a day (TID) | ORAL | Status: DC

## 2014-01-20 NOTE — Discharge Summary (Signed)
Physician Discharge Summary  Patient ID: [name] MRN: [id number] DOB/AGE: Jan 08, 1976 39 y.o.  Admit date: 12/26/2013 Discharge date: 01/20/2014  Discharge Diagnoses:  Principal Problem:   Amputation of right lower extremity below knee Active Problems:   DM (diabetes mellitus) type I uncontrolled, periph vascular disorder   Morbid obesity   Essential hypertension   Hyperlipidemia   Constipation   Discharged Condition: Stable   Significant Diagnostic Studies: Dg Chest 2 View  01/20/2014   CLINICAL DATA:  Cough, congestion and shortness of breath for a few days. Lower extremity amputation last month.  EXAM: CHEST  2 VIEW  COMPARISON:  01/16/2014  FINDINGS: Right-sided PICC line unchanged with tip obliquely oriented in the midline and not seen on the lateral film as positioning is not specific as this could be arterial or venous in location. Lungs are hypoinflated without definite focal consolidation or effusion. Cardiomediastinal silhouette and remainder of the exam is unchanged.  IMPRESSION: No definite acute cardiopulmonary disease.  Right-sided PICC line unchanged with tip obliquely oriented in the midline as positioning is nonspecific and this could be venous or arterial in location.  These results were called by telephone at the time of interpretation on 01/20/2014 at 9:27 am to patient's nurse, Dwaine Gale, who verbally acknowledged these results.   Electronically Signed   By: Marin Olp M.D.   On: 01/20/2014 09:28   Dg Chest 2 View  01/16/2014   CLINICAL DATA:  Shortness of breath, cough  EXAM: CHEST  2 VIEW  COMPARISON:  12/16/2013  FINDINGS: Low lung volumes. Mild patchy left lower lobe opacity, suspicious for pneumonia. No pleural effusion or pneumothorax.  Mild cardiomegaly.  Mild degenerative changes of the visualized thoracolumbar spine.  IMPRESSION: Mild patchy left upper lobe opacity, suspicious for pneumonia.   Electronically Signed   By: Julian Hy M.D.   On:  01/16/2014 22:03    Labs:  Basic Metabolic Panel: BMP Latest Ref Rng 01/05/2014 12/31/2013 12/29/2013  Glucose 70 - 99 mg/dL 113(H) 86 72  BUN 6 - 23 mg/dL 19 21 24(H)  Creatinine 0.50 - 1.35 mg/dL 2.10(H) 2.30(H) 1.92(H)  Sodium 135 - 145 mmol/L 136 135 143  Potassium 3.5 - 5.1 mmol/L 4.1 4.1 5.1  Chloride 96 - 112 mEq/L 107 109 111  CO2 19 - 32 mmol/L 23 21 20   Calcium 8.4 - 10.5 mg/dL 8.0(L) 7.8(L) 8.2(L)     CBC:  Recent Labs Lab 01/18/14 0639  WBC 9.2  HGB 9.4*  HCT 29.0*  MCV 83.3  PLT 456*    CBG:  Recent Labs Lab 01/19/14 1630 01/19/14 2101 01/20/14 0646 01/20/14 1220 01/20/14 1643  GLUCAP 137* 124* 155* 169* 113*    Brief HPI:   [name] is a 39 y.o. male with history of DM type 2 with diabetic foot ulcer, morbid obesity, HTN, CKD, on chronic coumadin for recurrent DVT/PE, medical non-compliance; who was admitted via MD office on 12/02 with fever chills, N/V and right foot pain. He was started on IV antibiotics for cellulitis and underlying abscess 4th MTP plantar surface as well as IVF for AKI on CKD.  Dr. Doran Durand was consulted for input and performed I and D at bedside with cultures positive for MRSA. He developed recurrent fever with  Leucocytosis and repeat MRI right foot on 12/06 revealed new abscess tracking medial plantar surface below 1st and 2nd MT with extensive edema due to cellulitis as well as myositis. He was taken to OR on 12/7 for I and  D by Dr. Veverly Fells. Patient has poor po intake with complaints of right hip pain as well as leucocytosis and ultrasound revealed superficial abscess. Dr. Megan Salon recommended I and D of superficial right buttock abscess as well as continuing Vancomycin alone. Patient continued to have poor wound healing with wet gangrene and failed attempts at limb salvage. He underwent R-BKA with I and D right buttock abscess by Dr. Doran Durand on 12/22/13 with recommendations to continue 14 days Vanc post op to treat buttock wound.   Therapy ongoing with and patient limited by pain and weakness but is showing improvement in participation with activity. CIR was recommended by MD and rehab team and patient admitted today.   Hospital Course: [name] was admitted to rehab 12/26/2013 for inpatient therapies to consist of PT and OT at least three hours five days a week. Past admission physiatrist, therapy team and rehab RN have worked together to provide customized collaborative inpatient rehab. Team has provided ego support to assist with depressed mood as well as adjustment issues. Lexapro was added for mood stabilization and his outlook and mentation had greatly improved by discharge. Blood pressures have been monitored on tid basis and have been controlled. Po intake was initially poor with recurrent hypoglycemic episodes. Lantus was discontinued and low dose 70/30 insulin was stated and slowly titrated to 10 units bid. He has completed his antibiotic regimen without side effects and wounds are showing steady healing. Left hip wound is showing  decrease in depth and no odor or drainage noted on dressing changes. L-BKA is healing well with staples intact and no signs or symptoms of infection noted.Reactive leucocytosis has resolved and he was transfused with 2 units PRBC for persistent anemia with drop in Hgb to 6.8. Follow up CBC shows improvement in H/H to 9.4/29.0.   He developed bronchitis prior to discharge and was started on Zosyn empirically on 01/08. Follow up CXR showed atelectatic changes and he was discharged on Levaquin to complete a weeks course of antibiotic regimen.  Pharmacy has been assisting with management of coumadin and INR has fluctuated greatly during his stay.  INR was supratherapeutic at discharge and he was advise to hold dose X 24 hours with decrease in dose to 7.5 mg/daily. HHRN to draw protime on 01/14 with result to Outpatient coumadin clinic.  Pain control is slowly improving but he continues to have  neuropathic symptoms. Low dose elavil was added to augment Neurontin and patient has been educated on desensitization as well as mirror therapy to help with symptoms. Peer to Peer visit also was very helpful and patient was advised to follow up with amputee support group past discharge. He has made great progress and was modified independent at wheel chair level by discharge. He was educated on HEP and referred to [facility] for medication assistance. [facility] to provide [facility] as well as SW for follow up past discharge.    Rehab course: During patient's stay in rehab weekly team conferences were held to monitor patient's progress, set goals and discuss barriers to discharge. Patient has had improvement in activity tolerance, balance, postural control, as well as ability to compensate for deficits. He is able to perform ADL tasks independently. He is able to perform slide transfers with SB independently and needs SBA for stand pivot transfers. He requires min assist with standing activity, car transfers and propulsion of wheelchair in community setting. Family education was done with girlfriend who will provide assistance as needed past discharge.  Disposition: 01-Home or Self Care  Diet: Diabetic.   Wound Care: Cleanse Right amputation site with soap and water. Pat dry and apply ace wrap. Wash area round left thigh with soap and water pat dry. Pack tract with iodoform guaze and Q tip. Cover with dry dressing. Change daily. Contact Dr. Doran Durand if you notice any odor, pus, redness, tenderness or develop fever or chills.   Special Instructions: 1. Activity as tolerated. 2. Check blood sugars 2-3 times per day.  3. HHRN to draw protime on 01/14 with results to Dr. Elie Confer at outpatient coumadin clinic.      Medication List    STOP taking these medications        lisinopril-hydrochlorothiazide 20-25 MG per tablet  Commonly known as:  PRINZIDE,ZESTORETIC     metFORMIN 500 MG tablet  Commonly  known as:  GLUCOPHAGE      TAKE these medications        acetaminophen 325 MG tablet  Commonly known as:  TYLENOL  Take 1-2 tablets (325-650 mg total) by mouth every 4 (four) hours as needed for mild pain.     ALPRAZolam 0.25 MG tablet--Rx # 30 pills  Commonly known as:  XANAX  Take 1 tablet (0.25 mg total) by mouth 2 (two) times daily as needed for anxiety.     amLODipine 10 MG tablet  Commonly known as:  NORVASC  Take 1 tablet (10 mg total) by mouth daily.     calcium carbonate 500 MG chewable tablet  Commonly known as:  TUMS - dosed in mg elemental calcium  Chew 1 tablet (200 mg of elemental calcium total) by mouth 3 (three) times daily with meals.     carvedilol 12.5 MG tablet  Commonly known as:  COREG  Take 1 tablet (12.5 mg total) by mouth 2 (two) times daily with a meal.     escitalopram 20 MG tablet  Commonly known as:  LEXAPRO  Take 1 tablet (20 mg total) by mouth at bedtime.     furosemide 40 MG tablet  Commonly known as:  LASIX  Take 1 tablet (40 mg total) by mouth daily.     gabapentin 300 MG capsule  Commonly known as:  NEURONTIN  Take 1 capsule (300 mg total) by mouth 3 (three) times daily.     insulin NPH-regular Human (70-30) 100 UNIT/ML injection  Commonly known as:  NOVOLIN 70/30  Inject 10 Units into the skin 2 (two) times daily with a meal.     levofloxacin 250 MG tablet  Commonly known as:  LEVAQUIN  Take 1 tablet (250 mg total) by mouth daily.     lovastatin 20 MG tablet  Commonly known as:  MEVACOR  Take 1 tablet (20 mg total) by mouth daily.     methocarbamol 500 MG tablet  Commonly known as:  ROBAXIN  Take 1 tablet (500 mg total) by mouth every 6 (six) hours as needed for muscle spasms.     multivitamin with minerals Tabs tablet  Take 1 tablet by mouth daily.     nortriptyline 10 MG capsule  Commonly known as:  PAMELOR  Take 1 capsule (10 mg total) by mouth at bedtime.     oxyCODONE 15 MG immediate release tablet--Rx # 120 pills    Commonly known as:  ROXICODONE  Take 1 tablet (15 mg total) by mouth every 6 (six) hours as needed for severe pain.     pantoprazole 40 MG tablet  Commonly known as:  PROTONIX  Take 1 tablet (40 mg total) by mouth 2 (two) times daily.     ranitidine 300 MG tablet  Commonly known as:  ZANTAC  Take 1 tablet (300 mg total) by mouth at bedtime.     senna-docusate 8.6-50 MG per tablet  Commonly known as:  Senokot-S  Take 2 tablets by mouth 2 (two) times daily.     tamsulosin 0.4 MG Caps capsule  Commonly known as:  FLOMAX  Take 1 capsule (0.4 mg total) by mouth daily after supper.     warfarin 5 MG tablet  Commonly known as:  COUMADIN  Take 1.5 tablets (7.5 mg) every day with supper starting tomorrow.       Follow-up Information    Follow up with Meredith Staggers, MD On 02/20/2014.   Specialty:  Physical Medicine and Rehabilitation   Why:  Be there at  9:40  for  9:50 am  appointment   Contact information:   [address], [address] [place] [address] [phone]       Follow up with [name], MD. Call today.   Specialty:  Orthopedic Surgery   Contact information:   [address] [place] [address] [phone]       Follow up with Lucious Groves, DO On 02/05/2014.   Specialty:  Internal Medicine   Why:   @ 2:15 pm   Contact information:   [place]  [address] [phone]       Signed: Bary Leriche 01/23/2014, 6:42 PM

## 2014-01-20 NOTE — Progress Notes (Signed)
Physical Therapy Discharge Summary  Patient Details  Name: [name] MRN: [id number] Date of Birth: 1975/12/01   Patient has met 7 of 11 long term goals due to improved activity tolerance, improved balance, increased strength and ability to compensate for deficits.  Patient to discharge at a wheelchair level Modified Independent w/ MinA for community propulsion and dynamic standing.   Patient's care partner is independent to provide the necessary physical assistance at discharge. Recommendations for transfers at home: If home alone, slide board transfer bed<>w/c. If SBA is available, slide board transfer bed>w/c and stand pivot w/ RW w/c>bed. Recommend pt ride in brother's SUV for all transportation due to difficulty transferring out of girlfriend's low car.   Reasons goals not met: Difficulty moving sit>stand from lower surface, decreased strength in UE limiting ambulation distance  Recommendation:  Patient will benefit from outpatient PT when ready to begin prosthesis training  Equipment: 24x18 wheelchair  Reasons for discharge: discharge from hospital  Patient/family agrees with progress made and goals achieved: Yes  PT Discharge Precautions/Restrictions Precautions Precautions: Fall Precaution Comments: New Rt BKA, with protector Required Braces or Orthoses: Knee Immobilizer - Right Restrictions Weight Bearing Restrictions: Yes RLE Weight Bearing: Non weight bearing Other Position/Activity Restrictions: new right BKA Vital Signs  Pain Pain Assessment Pain Assessment: 0-10 Pain Score: 10-Worst pain ever Pain Type: Phantom pain Pain Location: Leg Pain Orientation: Right Pain Descriptors / Indicators: Tightness Pain Onset: On-going Pain Intervention(s):  (Received medication from RN) Multiple Pain Sites: No Vision/Perception  Perception Comments: WFL  Cognition Overall Cognitive Status: Within Functional Limits for tasks assessed Arousal/Alertness:  Awake/alert Orientation Level: Oriented X4 Attention: Divided Divided Attention: Appears intact Memory: Appears intact Awareness: Appears intact Problem Solving: Appears intact Safety/Judgment: Appears intact Sensation Sensation Light Touch: Appears Intact Stereognosis: Appears Intact Hot/Cold: Appears Intact Proprioception: Appears Intact Additional Comments: BUE WFL;  tingly R distal residual limb Coordination Gross Motor Movements are Fluid and Coordinated: Yes Fine Motor Movements are Fluid and Coordinated: Yes Coordination and Movement Description: WFL @ BUE Motor  Motor Motor: Within Functional Limits Motor - Discharge Observations: requires extra time for processing and motor planning with all mobility  Mobility Bed Mobility Bed Mobility: Rolling Right;Rolling Left;Supine to Sit;Sitting - Scoot to [employer] of Bed;Sit to Supine;Scooting to [facility] Rolling Right: 6: Modified independent (Device/Increase time) Rolling Left: 6: Modified independent (Device/Increase time) Supine to Sit: 6: Modified independent (Device/Increase time) Sitting - Scoot to Edge of Bed: 6: Modified independent (Device/Increase time) Sit to Supine: 6: Modified independent (Device/Increase time) Scooting to [facility] [facility]: 6: Modified independent (Device/Increase time) Transfers Sit to Stand: 5: Supervision Sit to Stand Details: Verbal cues for safe use of DME/AE;Verbal cues for precautions/safety Sit to Stand Details (indicate cue type and reason): steadying RW Stand to Sit: 5: Supervision Stand to Sit Details (indicate cue type and reason): Verbal cues for safe use of DME/AE;Verbal cues for precautions/safety Stand to Sit Details: VC's for safe placement of DME Locomotion  Ambulation Ambulation/Gait Assistance: Not tested (comment) Stairs / Additional Locomotion Stairs: No Ramp: Not tested (comment) Curb: Not tested (comment) Product manager Mobility: Yes Wheelchair Assistance: 6: Modified  independent (Device/Increase time) Environmental health practitioner: Both upper extremities Wheelchair Parts Management: Independent Distance: 150  Trunk/Postural Assessment  Cervical Assessment Cervical Assessment: Within Functional Limits Thoracic Assessment Thoracic Assessment: Within Functional Limits Lumbar Assessment Lumbar Assessment: Within Functional Limits Postural Control Postural Control: Within Functional Limits  Balance Balance Balance Assessed: Yes Static Sitting Balance Static Sitting - Level of Assistance: 6:  Modified independent (Device/Increase time) Dynamic Sitting Balance Dynamic Sitting - Level of Assistance: 6: Modified independent (Device/Increase time) Static Standing Balance Static Standing - Balance Support: Bilateral upper extremity supported;During functional activity Static Standing - Level of Assistance: 5: Stand by assistance Dynamic Standing Balance Dynamic Standing - Balance Support: Bilateral upper extremity supported Dynamic Standing - Level of Assistance: 4: Min assist Dynamic Standing - Balance Activities: Lateral lean/weight shifting;Forward lean/weight shifting;Reaching for objects Extremity Assessment  RUE Assessment RUE Assessment: Within Functional Limits LUE Assessment LUE Assessment: Within Functional Limits RLE AROM (degrees) Overall AROM Right Lower Extremity: Deficits;Due to pain (significant phantom pain) RLE Strength Right Hip Flexion: 4+/5 Right Knee Flexion: 4+/5 Right Knee Extension: 4+/5 LLE Assessment LLE Assessment: Within Functional Limits  See FIM for current functional status  [name] 01/20/2014, 2:09 PM

## 2014-01-20 NOTE — Progress Notes (Signed)
Occupational Therapy Discharge Summary  Patient Details  Name: [name] MRN: [id number] Date of Birth: [birth year]   Patient has met 10 of 10 long term goals due to improved activity tolerance, improved balance, ability to compensate for deficits and improved awareness.  Patient to discharge at overall Modified Independent at w/c level.  Patient's care partner is independent to provide the necessary physical assistance at discharge to supervise bath transfers as feasible.    Reasons goals not met: n/a  Recommendation:  Patient is not eligible for ongoing skilled OT services in home health setting to continue to advance functional skills in the area of iADL.  Equipment: Drop Arm BSC, Tub bench, reacher, sock aid, LH sponge  Reasons for discharge: treatment goals met  Patient/family agrees with progress made and goals achieved: Yes  OT Discharge Precautions/Restrictions  Precautions Precautions: Fall Precaution Comments: New Rt BKA Weight Bearing Restrictions: Yes RLE Weight Bearing: Non weight bearing Other Position/Activity Restrictions: new right BKA  Pain Pain Assessment Pain Assessment: 0-10 Pain Score: 7  Pain Type: Phantom pain;Neuropathic pain Pain Location: Leg Pain Orientation: Right Pain Descriptors / Indicators: Tightness Pain Onset: On-going Pain Intervention(s): Medication (See eMAR) Multiple Pain Sites: No  ADL ADL ADL Comments: see FIM  Vision/Perception  Vision- History Baseline Vision/History: No visual deficits Patient Visual Report: No change from baseline Perception Comments: WFL   Cognition Overall Cognitive Status: Within Functional Limits for tasks assessed Arousal/Alertness: Awake/alert Orientation Level: Oriented X4 Attention: Divided Divided Attention: Appears intact Memory: Appears intact Awareness: Appears intact Problem Solving: Appears intact Safety/Judgment: Appears intact  Sensation Sensation Light Touch: Appears  Intact Stereognosis: Appears Intact Hot/Cold: Appears Intact Proprioception: Appears Intact Additional Comments: BUE WFL;  tingly R distal residual limb Coordination Gross Motor Movements are Fluid and Coordinated: Yes Fine Motor Movements are Fluid and Coordinated: Yes Coordination and Movement Description: WFL @ BUE  Motor  Motor Motor: Within Functional Limits Motor - Discharge Observations: intermittent delayed execution of motor plan while generating moomentum during sit>stand  Mobility  Bed Mobility Bed Mobility: Rolling Right;Rolling Left;Supine to Sit;Sitting - Scoot to [employer] of Bed;Sit to Supine;Scooting to [facility] Rolling Right: 6: Modified independent (Device/Increase time) Rolling Left: 6: Modified independent (Device/Increase time) Supine to Sit: 6: Modified independent (Device/Increase time) Sitting - Scoot to Edge of Bed: 6: Modified independent (Device/Increase time) Sit to Supine: 6: Modified independent (Device/Increase time) Scooting to [facility]: 6: Modified independent (Device/Increase time) Transfers Transfers: Sit to Stand;Stand to Sit Sit to Stand: 5: Supervision Sit to Stand Details: Verbal cues for safe use of DME/AE;Verbal cues for precautions/safety Stand to Sit: 5: Supervision Stand to Sit Details (indicate cue type and reason): Verbal cues for safe use of DME/AE;Verbal cues for precautions/safety Stand to Sit Details: supervision to manage equipment in controlled environment   Trunk/Postural Assessment  Cervical Assessment Cervical Assessment: Within Functional Limits Thoracic Assessment Thoracic Assessment: Within Functional Limits Lumbar Assessment Lumbar Assessment: Within Functional Limits Postural Control Postural Control: Within Functional Limits   Balance Balance Balance Assessed: Yes Static Standing Balance Static Standing - Balance Support: Bilateral upper extremity supported;During functional activity Static Standing - Level of Assistance:  5: Stand by assistance Dynamic Standing Balance Dynamic Standing - Balance Support: Bilateral upper extremity supported Dynamic Standing - Level of Assistance: 4: Min assist Dynamic Standing - Balance Activities: Lateral lean/weight shifting;Forward lean/weight shifting;Reaching for objects  Extremity/Trunk Assessment RUE Assessment RUE Assessment: Within Functional Limits LUE Assessment LUE Assessment: Within Functional Limits  See FIM for current functional status  [name] 01/20/2014, 2:04 PM

## 2014-01-20 NOTE — Progress Notes (Addendum)
Social Work  Discharge Note  The overall goal for the admission was met for:   Discharge location: Yes - home with girlfriend and family to provide 24/7 assistance  Length of Stay: Yes - 25 days  Discharge activity level: Yes - modified independent to supervision  Home/community participation: Yes  Services provided included: MD, RD, PT, OT, RN, TR, Pharmacy, Neuropsych and SW  Financial Services:  Medicaid and SSD applications pending  Follow-up services arranged: Home Health: RN, SW via [place], DME: [phone] wheelchair with ELRs, cushion, bariatric rolling walker, drop arm commode, tub bench, transfer board all via [place], Other: referred pt to [facility] medication assistance program.   and Patient/Family has no preference for HH/DME agencies  Comments (or additional information): Provided pt with information on how to apply for the [employer] and discussed option of changing primary care to [facility] as they can also assist with medications.  (Currently followed at [facility]).  Pt and gf state understanding of these options.  Pt also provided with written info on the local amputee support group.  Patient/Family verbalized understanding of follow-up arrangements: Yes  Individual responsible for coordination of the follow-up plan: patient  Confirmed correct DME delivered: [name] 01/20/2014    [name]

## 2014-01-20 NOTE — Plan of Care (Signed)
Problem: RH Ambulation Goal: LTG Patient will ambulate in controlled environment (PT) LTG: Patient will ambulate in a controlled environment, # of feet with assistance (PT).  Outcome: Not Met (add Reason) Pt unable to ambulate 25' due to BUE weakness and decreased endurance Goal: LTG Patient will ambulate in home environment (PT) LTG: Patient will ambulate in home environment, # of feet with assistance (PT).  Outcome: Not Met (add Reason) Unable to assess at discharge due to significant phantom pain and decreased endurance d/t suspected pneumonia. Prior during admission pt ambulated 15' w/ RW and SBA in controlled environment. Recommend pt remain at wheelchair level at discharge until strength and endurance have improved.

## 2014-01-20 NOTE — Progress Notes (Signed)
Patient received all supplies and equipment today. Patient girlfriend demonstrated proper technique for dressing changes. Discharge instructions given via Reesa Chew PA. Patient denies any questions. Vitals stable. Awaiting transportation for patient. Report given to [name].

## 2014-01-20 NOTE — Plan of Care (Signed)
Problem: RH PAIN MANAGEMENT Goal: RH STG PAIN MANAGED AT OR BELOW PT'S PAIN GOAL Less than 8 out of 10  Outcome: Adequate for Discharge Continues to have phantom pain educated on techniques to help relieve pain in addition to pain medication

## 2014-01-20 NOTE — Progress Notes (Signed)
Occupational Therapy Session Note  Patient Details  Name: [name] MRN: [id number] Date of Birth: December 18, 1975  Today's Date: 01/20/2014 OT Individual Time: 0729-0829 OT Individual Time Calculation (min): 60 min    Short Term Goals: Week 3:  OT Short Term Goal 1 (Week 3): STG=LTG due to short remaining LOS  Skilled Therapeutic Interventions/Progress Updates: ADL-retraining with emphasis on adapted bathing/dressing at bed level, endurance, and discharge planning.   Pt performed bathing and dressing at edge of bed with only setup assist to expedite process.   Pt is able to gather his own supplies at w/c level but room was cluttered with additional supplies (DME to go home).    Pt was advised to call and contact significant other (SO) to remove all DME and additional supplies, which he did during session.   Pt performed BADL to include washing his left foot and donning sock and shoe and tying laces this session while seated at edge of bed.    Pt completed SPT to w/c at end of session unassisted using appropriate technique and bed height elevated slightly to approximate height of bed he plans to use at his SOs house.   Pt left in w/c at end of session with breakfast tray and call light within reach.       Therapy Documentation Precautions:  Precautions Precautions: Fall Precaution Comments: New Rt BKA, with protector Required Braces or Orthoses: Knee Immobilizer - Right Restrictions Weight Bearing Restrictions: Yes RLE Weight Bearing: Non weight bearing Other Position/Activity Restrictions: new right BKA   Vital Signs:  Pain: Pain Assessment Pain Assessment: 0-10 Pain Score: 7 Pain Type: Neuropathic pain Pain Location: Leg Pain Orientation: Right Pain Intervention(s): Medication (See eMAR)  See FIM for current functional status  Therapy/Group: Individual Therapy   Second session: Time: 1100-1230 Time Calculation (min):  90 min  Pain Assessment: 7/10, RLE, neuropathic pain,  distraction  Skilled Therapeutic Interventions: Therapeutic activity with focus on improved safety awareness, family ed/training on use of DME, instruction on ice pack use and desensitization, and performance of select iADL task at w/c level (cooking: baking cakes).   Pt received in bed with significant other (SO) present.   OT educated SO on method of transport and reassembly of BSC and tub bench in her car.   Pt able to complete stand-pivot transfer from EOB to w/c unassisted using RW and then requested return to kitchen task to complete activity which was previously supported (supplies provided) by recreation therapy but deferred d/t disabling episode of bronchitis and severe phantom limb pain.   OT reviewed pt's performance with BADL and achievement of goals before escort to kitchen.   At w/c level, pt able to alternate attention to task and release role of meal prep as needed by delegating tasks to avoid burn and/or injury.    Pt prepared 2 cake mixes at table top from w/c with setup assist from therapist to gather supplies to expedite prep time.    Pt enjoyed task and reported plan to continue cooking/baking as therapeutic to his recovery and rehabilitation.   Pt returned to room after extended session and completed SPT to bed for planned IV management team procedure.   SO remained in room for emotional support.   See FIM for current functional status  Therapy/Group: Individual Therapy  [place] 01/20/2014, 10:46 AM

## 2014-01-20 NOTE — Progress Notes (Signed)
Physical Therapy Session Note  Patient Details  Name: [name] MRN: [id number] Date of Birth: 22-Sep-1975  Today's Date: 01/20/2014 PT Individual Time: 0915-1000 PT Individual Time Calculation (min): 45 min   Short Term Goals: Week 3:  PT Short Term Goal 1 (Week 3): STGs = LTGs due to ELOS  Skilled Therapeutic Interventions/Progress Updates:    Pt received seated in w/c after transport back from x-ray, agreeable to participate in therapy. Session focused on discharge planning, transfers. Instructed pt in following recommendations: If home alone, use slide board transfer to move bed<>w/c (pt is mod (I) for this transfer) If he has SBA at home, use slide board to move bed>w/c and use RW for stand pivot w/c>bed Only ride in brother's car (SUV) instead of girlfriend's car (small sedan with low seat height). Use RW for SPT w/c>car, use slide board for car>w/c.   Pt demonstrated understanding and taught back transfer recommendations. Pt able to use slide board to move bed<> w/c w/ mod (I) w/ level transfers, SBA for stand pivot transfers and car transfers (both slide board and RW).   Pt left seated in w/c w/ RN present administering medication.    Therapy Documentation Precautions:  Precautions Precautions: Fall Precaution Comments: New Rt BKA, with protector Required Braces or Orthoses: Knee Immobilizer - Right Restrictions Weight Bearing Restrictions: Yes RLE Weight Bearing: Non weight bearing Other Position/Activity Restrictions: new right BKA Pain: Pain Assessment Pain Assessment: 0-10 Pain Score: 10-Worst pain ever Pain Type: Phantom pain Pain Location: Leg Pain Orientation: Right Pain Intervention(s): Medication (See eMAR)  See FIM for current functional status  Therapy/Group: Individual Therapy  [name]  [name], PT, DPT 01/20/2014, 7:32 AM

## 2014-01-20 NOTE — Progress Notes (Signed)
Physical Therapy Session Note  Patient Details  Name: [name] MRN: [id number] Date of Birth: Jul 11, 1975  Today's Date: 01/20/2014 PT Individual Time: [id number] PT Individual Time Calculation (min): 20 min   Short Term Goals: Week 3:  PT Short Term Goal 1 (Week 3): STGs = LTGs due to ELOS  Skilled Therapeutic Interventions/Progress Updates:    Patient received sitting in wheelchair in ortho gym. Session consisted of performing simulated car transfer to SUV height. Patient performs wheelchair>simulated car transfer via stand pivot and RW with minA, simulated car transfer>wheelchair via slideboard transfer and supervision/set up for stabilization of wheelchair. Patient declined to perform any gait training due to phantom limb pain. Patient returned to room and left sitting in wheelchair with PA present.  Therapy Documentation Precautions:  Precautions Precautions: Fall Precaution Comments: New Rt BKA, with protector Required Braces or Orthoses: Knee Immobilizer - Right Restrictions Weight Bearing Restrictions: Yes RLE Weight Bearing: Non weight bearing Other Position/Activity Restrictions: new right BKA Pain: Pain Assessment Pain Assessment: 0-10 Pain Score: 10-Worst pain ever Pain Type: Phantom pain Pain Location: Leg Pain Orientation: Right Locomotion : Ambulation Ambulation/Gait Assistance: Not tested (comment)   See FIM for current functional status  Therapy/Group: Individual Therapy  Lillia Abed. Blessed Cotham, PT, DPT 01/20/2014, 10:44 AM

## 2014-01-20 NOTE — Progress Notes (Signed)
Subjective/Complaints:  Had some coughing last night. Phantom pain "better". Refused participation in therapies yesterday due to pain. Review of Systems - Negative except ccoughing , no abdominal pain diarrhea or constipation    Objective: Vital Signs: Blood pressure 146/73, pulse 98, temperature 99 F (37.2 C), temperature source Oral, resp. rate 20, weight 159 kg (350 lb 8.5 oz), SpO2 99 %. No results found. Results for orders placed or performed during the hospital encounter of 12/26/13 (from the past 72 hour(s))  Glucose, capillary     Status: Abnormal   Collection Time: 01/17/14 12:08 PM  Result Value Ref Range   Glucose-Capillary 144 (H) 70 - 99 mg/dL  Glucose, capillary     Status: Abnormal   Collection Time: 01/17/14  4:50 PM  Result Value Ref Range   Glucose-Capillary 119 (H) 70 - 99 mg/dL  Glucose, capillary     Status: None   Collection Time: 01/17/14  9:04 PM  Result Value Ref Range   Glucose-Capillary 98 70 - 99 mg/dL  Protime-INR     Status: Abnormal   Collection Time: 01/18/14  6:39 AM  Result Value Ref Range   Prothrombin Time 28.5 (H) 11.6 - 15.2 seconds   INR 2.65 (H) 0.00 - 1.49  CBC     Status: Abnormal   Collection Time: 01/18/14  6:39 AM  Result Value Ref Range   WBC 9.2 4.0 - 10.5 K/uL   RBC 3.48 (L) 4.22 - 5.81 MIL/uL   Hemoglobin 9.4 (L) 13.0 - 17.0 g/dL   HCT 29.0 (L) 39.0 - 52.0 %   MCV 83.3 78.0 - 100.0 fL   MCH 27.0 26.0 - 34.0 pg   MCHC 32.4 30.0 - 36.0 g/dL   RDW 14.5 11.5 - 15.5 %   Platelets 456 (H) 150 - 400 K/uL  Glucose, capillary     Status: Abnormal   Collection Time: 01/18/14  6:41 AM  Result Value Ref Range   Glucose-Capillary 125 (H) 70 - 99 mg/dL  Glucose, capillary     Status: None   Collection Time: 01/18/14 11:27 AM  Result Value Ref Range   Glucose-Capillary 86 70 - 99 mg/dL   Comment 1 Notify RN   Glucose, capillary     Status: Abnormal   Collection Time: 01/18/14  4:29 PM  Result Value Ref Range   Glucose-Capillary 149 (H) 70 - 99 mg/dL   Comment 1 Notify RN   Glucose, capillary     Status: Abnormal   Collection Time: 01/18/14  9:02 PM  Result Value Ref Range   Glucose-Capillary 130 (H) 70 - 99 mg/dL  Protime-INR     Status: Abnormal   Collection Time: 01/19/14  2:21 AM  Result Value Ref Range   Prothrombin Time 35.2 (H) 11.6 - 15.2 seconds   INR 3.48 (H) 0.00 - 1.49  Glucose, capillary     Status: Abnormal   Collection Time: 01/19/14  6:49 AM  Result Value Ref Range   Glucose-Capillary 141 (H) 70 - 99 mg/dL  Glucose, capillary     Status: Abnormal   Collection Time: 01/19/14 11:40 AM  Result Value Ref Range   Glucose-Capillary 130 (H) 70 - 99 mg/dL  Glucose, capillary     Status: Abnormal   Collection Time: 01/19/14  4:30 PM  Result Value Ref Range   Glucose-Capillary 137 (H) 70 - 99 mg/dL  Glucose, capillary     Status: Abnormal   Collection Time: 01/19/14  9:01 PM  Result Value Ref  Range   Glucose-Capillary 124 (H) 70 - 99 mg/dL  Protime-INR     Status: Abnormal   Collection Time: 01/20/14  4:31 AM  Result Value Ref Range   Prothrombin Time 41.2 (H) 11.6 - 15.2 seconds   INR 4.25 (H) 0.00 - 1.49  Glucose, capillary     Status: Abnormal   Collection Time: 01/20/14  6:46 AM  Result Value Ref Range   Glucose-Capillary 155 (H) 70 - 99 mg/dL     HEENT: normal Cardio: RRR and no murmurs Resp: CTA B/L and unlabored. Occasional rhonchi. No wheezes GI: BS positive and nontender nondistended Extremity:  Edema left stump Skin:   Wound clean with minimal drainage from buttock--iodoform. Right stump with minimal to no drainage except for medial portion of incision Neuro: Alert/Oriented and Abnormal Motor decreased left knee flexion extension inhibited by pain Musc/Skel:  Right leg appropriately tender. Gen NAD   Assessment/Plan: 1. Functional deficits secondary to Right BKA which require 3+ hours per day of interdisciplinary therapy in a comprehensive inpatient rehab  setting. Physiatrist is providing close team supervision and 24 hour management of active medical problems listed below. Physiatrist and rehab team continue to assess barriers to discharge/monitor patient progress toward functional and medical goals.  CAN DC HOME TODAY PENDING CXR RESULTS. WILL NEED HOME HEALTH FOLLOW UP. I'LL SEE IN PROSTHETIC CLINIC IN ABOUT A MONTH.   FIM: FIM - Bathing Bathing Steps Patient Completed: Chest, Right Arm, Left Arm, Abdomen, Front perineal area, Buttocks, Right upper leg, Left upper leg, Left lower leg (including foot) Bathing: 5: Supervision: Safety issues/verbal cues  FIM - Upper Body Dressing/Undressing Upper body dressing/undressing steps patient completed: Thread/unthread right sleeve of pullover shirt/dresss, Thread/unthread left sleeve of pullover shirt/dress, Put head through opening of pull over shirt/dress, Pull shirt over trunk Upper body dressing/undressing: 5: Set-up assist to: Obtain clothing/put away FIM - Lower Body Dressing/Undressing Lower body dressing/undressing steps patient completed: Thread/unthread right underwear leg, Thread/unthread left underwear leg, Pull underwear up/down, Thread/unthread right pants leg, Thread/unthread left pants leg, Pull pants up/down, Don/Doff left sock Lower body dressing/undressing: 4: Min-Patient completed 75 plus % of tasks  FIM - Toileting Toileting steps completed by patient: Adjust clothing prior to toileting, Performs perineal hygiene Toileting Assistive Devices: Grab bar or rail for support Toileting: 3: Mod-Patient completed 2 of 3 steps  FIM - Radio producer Devices: Product manager Transfers: 0-Activity did not occur  FIM - Control and instrumentation engineer Devices: Arm rests, Bed rails Bed/Chair Transfer: 5: Supine > Sit: Supervision (verbal cues/safety issues), 5: Chair or W/C > Bed: Supervision (verbal cues/safety issues), 5: Bed > Chair or W/C:  Supervision (verbal cues/safety issues)  FIM - Locomotion: Wheelchair Distance: 150 Locomotion: Wheelchair: 5: Travels 150 ft or more: maneuvers on rugs and over door sills with supervision, cueing or coaxing FIM - Locomotion: Ambulation Locomotion: Ambulation Assistive Devices: Administrator Ambulation/Gait Assistance: 4: Min guard Locomotion: Ambulation: 0: Activity did not occur  Comprehension Comprehension Mode: Auditory Comprehension: 6-Follows complex conversation/direction: With extra time/assistive device  Expression Expression Mode: Verbal Expression: 6-Expresses complex ideas: With extra time/assistive device  Social Interaction Social Interaction: 6-Interacts appropriately with others with medication or extra time (anti-anxiety, antidepressant).  Problem Solving Problem Solving: 6-Solves complex problems: With extra time  Memory Memory: 7-Complete Independence: No helper  Medical Problem List and Plan: 1. Functional deficits secondary to R-BKA, also severely deconditioned 2. DVT Prophylaxis/Anticoagulation: Pharmaceutical: Lovenox 3. Pain Management: Premedicate prior to therapy sessions. --  oxycodone prn  -increased gabapentin to 300mg  tid on Friday  -added low dose pamelor for phantom limb pain  -wrap stump a little more loosely---explained to him the need to use compression however 4. Mood: team to provide ego support. Added lexapro for mood stabilization. LCSW to follow for evaluation and support.  .  5. Neuropsych: This patient is capable of making decisions on his own behalf. 6. Skin/Wound Care: Daily dressing changes to left buttock wound. Routine pressure relief measures. Monitor wound daily for healing.  -until stump stops drainage--use 4x4, kerlix, ACE 7. Fluids/Electrolytes/Nutrition: Monitor I/O. Added protein supplements to help with low protein stores.   -dc'ed megace.    -RD consult 8. Reactive leucocytosis: Resolving . Monitor wound and  temperature curve daily. Low grade temp still  9. DM type 2:  stopped lantus---  70/30 insulin increased to 10u  -intake improving--adjust insulin as needed  -cbg's reasonable    10. CKD: Monitor renal status closely repeat BMET ordered 11. HTN: Monitor BP tid on norvasc. Off prinizide and lasix due to AKI on CKD. Baseline Cr- 1.78 but is stabilizing to 2.3 range.   -increased coreg to 12.5mg  bid  -some increase today due to pain 12. Acute on chronic anemia: Low iron stores with high ferritin levels.  Transfused  2 units yesterday  -stool ob neg.  13. Left buttock wound: S/p I & D 12/14--antibiotics complete  14. Resting tachycardia: bb increased 15.  Probable nosocomial pneumonia ?LUL infiltrate on Xray, white count normal, sputum culture with MRSA, on empiric Zosyn, symptomatically improving--  -re-check cxr today. Pending results, change to oral abx and dc home        LOS (Days) 25 A FACE TO FACE EVALUATION WAS PERFORMED  Vidhi Delellis T 01/20/2014, 7:51 AM

## 2014-01-20 NOTE — Progress Notes (Signed)
ANTICOAGULATION CONSULT NOTE - Follow Up Consult  Pharmacy Consult for coumadin and zosyn Indication: hx of DVT/PE and for possible HCAP, respectively  No Known Allergies  Patient Measurements: Weight: (!) 350 lb 8.5 oz (159 kg) Heparin Dosing Weight:   Vital Signs: Temp: 99 F (37.2 C) (01/12 0530) Temp Source: Oral (01/12 0407) BP: 146/73 mmHg (01/12 0407)  Labs:  Recent Labs  01/18/14 0639 01/19/14 0221 01/20/14 0431  HGB 9.4*  --   --   HCT 29.0*  --   --   PLT 456*  --   --   LABPROT 28.5* 35.2* 41.2*  INR 2.65* 3.48* 4.25*    Estimated Creatinine Clearance: 76.9 mL/min (by C-G formula based on Cr of 2.1).   Medications:  Scheduled:  . amLODipine  10 mg Oral Daily  . calcium carbonate  1 tablet Oral TID WC  . carvedilol  12.5 mg Oral BID WC  . escitalopram  20 mg Oral QHS  . feeding supplement (GLUCERNA SHAKE)  237 mL Oral BID BM  . furosemide  20 mg Oral Daily  . gabapentin  300 mg Oral TID  . insulin aspart  0-15 Units Subcutaneous TID WC  . insulin aspart protamine- aspart  10 Units Subcutaneous BID WC  . multivitamin with minerals  1 tablet Oral Daily  . nortriptyline  10 mg Oral QHS  . pantoprazole  40 mg Oral BID  . piperacillin-tazobactam (ZOSYN)  IV  3.375 g Intravenous 3 times per day  . pravastatin  20 mg Oral q1800  . senna-docusate  2 tablet Oral BID  . sorbitol  45 mL Oral Once  . tamsulosin  0.4 mg Oral QPC supper  . Warfarin - Pharmacist Dosing Inpatient   Does not apply q1800   Infusions:    Assessment: 39 yo male with hx of DVT/PE is currently on supratherapuetic coumadin.  INR today up to 4.25 from 3.48.  Patient's coumadin dosing has been hard to determine since he only required 0.5-1 mg at one point and later seemed to require more but now is the opposite.  INR fluctuates (prob has to do with diet).Patient is also on zosyn for HCAP (day 4)  Goal of Therapy:  INR 2-3 Monitor platelets by anticoagulation protocol: Yes   Plan:   - No coumadin today - Cont daily INR (monitor for bleeding) - Cont zosyn 3.375g iv q8h (4hr infusion)  Jondavid Schreier, [name] 01/20/2014,8:07 AM

## 2014-01-29 ENCOUNTER — Telehealth: Payer: Self-pay | Admitting: *Deleted

## 2014-01-29 ENCOUNTER — Observation Stay (HOSPITAL_COMMUNITY)
Admission: EM | Admit: 2014-01-29 | Discharge: 2014-02-04 | Disposition: A | Discharge status: Skilled Nursing Facility | Payer: Medicaid Other | Attending: Internal Medicine | Admitting: Internal Medicine

## 2014-01-29 ENCOUNTER — Encounter (HOSPITAL_COMMUNITY): Payer: Self-pay | Admitting: Emergency Medicine

## 2014-01-29 DIAGNOSIS — G629 Polyneuropathy, unspecified: Secondary | ICD-10-CM | POA: Insufficient documentation

## 2014-01-29 DIAGNOSIS — I129 Hypertensive chronic kidney disease with stage 1 through stage 4 chronic kidney disease, or unspecified chronic kidney disease: Secondary | ICD-10-CM | POA: Insufficient documentation

## 2014-01-29 DIAGNOSIS — Y92009 Unspecified place in unspecified non-institutional (private) residence as the place of occurrence of the external cause: Secondary | ICD-10-CM | POA: Insufficient documentation

## 2014-01-29 DIAGNOSIS — E1022 Type 1 diabetes mellitus with diabetic chronic kidney disease: Secondary | ICD-10-CM | POA: Diagnosis not present

## 2014-01-29 DIAGNOSIS — E785 Hyperlipidemia, unspecified: Secondary | ICD-10-CM | POA: Insufficient documentation

## 2014-01-29 DIAGNOSIS — S78111A Complete traumatic amputation at level between right hip and knee, initial encounter: Secondary | ICD-10-CM

## 2014-01-29 DIAGNOSIS — Z86711 Personal history of pulmonary embolism: Secondary | ICD-10-CM | POA: Diagnosis not present

## 2014-01-29 DIAGNOSIS — N184 Chronic kidney disease, stage 4 (severe): Secondary | ICD-10-CM | POA: Diagnosis present

## 2014-01-29 DIAGNOSIS — N183 Chronic kidney disease, stage 3 unspecified: Secondary | ICD-10-CM | POA: Diagnosis present

## 2014-01-29 DIAGNOSIS — D649 Anemia, unspecified: Secondary | ICD-10-CM | POA: Diagnosis present

## 2014-01-29 DIAGNOSIS — I10 Essential (primary) hypertension: Secondary | ICD-10-CM | POA: Diagnosis present

## 2014-01-29 DIAGNOSIS — R748 Abnormal levels of other serum enzymes: Secondary | ICD-10-CM | POA: Diagnosis not present

## 2014-01-29 DIAGNOSIS — Z6841 Body Mass Index (BMI) 40.0 and over, adult: Secondary | ICD-10-CM | POA: Insufficient documentation

## 2014-01-29 DIAGNOSIS — E119 Type 2 diabetes mellitus without complications: Secondary | ICD-10-CM | POA: Diagnosis present

## 2014-01-29 DIAGNOSIS — R112 Nausea with vomiting, unspecified: Secondary | ICD-10-CM

## 2014-01-29 DIAGNOSIS — T45515A Adverse effect of anticoagulants, initial encounter: Secondary | ICD-10-CM | POA: Insufficient documentation

## 2014-01-29 DIAGNOSIS — Z89511 Acquired absence of right leg below knee: Secondary | ICD-10-CM | POA: Diagnosis not present

## 2014-01-29 DIAGNOSIS — W19XXXA Unspecified fall, initial encounter: Secondary | ICD-10-CM

## 2014-01-29 DIAGNOSIS — R791 Abnormal coagulation profile: Secondary | ICD-10-CM | POA: Diagnosis present

## 2014-01-29 DIAGNOSIS — E1065 Type 1 diabetes mellitus with hyperglycemia: Secondary | ICD-10-CM | POA: Insufficient documentation

## 2014-01-29 DIAGNOSIS — Z9181 History of falling: Secondary | ICD-10-CM | POA: Diagnosis not present

## 2014-01-29 DIAGNOSIS — Z794 Long term (current) use of insulin: Secondary | ICD-10-CM | POA: Diagnosis not present

## 2014-01-29 DIAGNOSIS — R319 Hematuria, unspecified: Secondary | ICD-10-CM | POA: Diagnosis not present

## 2014-01-29 DIAGNOSIS — D631 Anemia in chronic kidney disease: Secondary | ICD-10-CM | POA: Diagnosis not present

## 2014-01-29 DIAGNOSIS — K219 Gastro-esophageal reflux disease without esophagitis: Secondary | ICD-10-CM | POA: Diagnosis not present

## 2014-01-29 DIAGNOSIS — Z7901 Long term (current) use of anticoagulants: Secondary | ICD-10-CM | POA: Diagnosis not present

## 2014-01-29 DIAGNOSIS — Z86718 Personal history of other venous thrombosis and embolism: Secondary | ICD-10-CM | POA: Diagnosis not present

## 2014-01-29 DIAGNOSIS — Z4781 Encounter for orthopedic aftercare following surgical amputation: Secondary | ICD-10-CM | POA: Insufficient documentation

## 2014-01-29 DIAGNOSIS — S88111A Complete traumatic amputation at level between knee and ankle, right lower leg, initial encounter: Secondary | ICD-10-CM

## 2014-01-29 DIAGNOSIS — E1051 Type 1 diabetes mellitus with diabetic peripheral angiopathy without gangrene: Secondary | ICD-10-CM | POA: Diagnosis not present

## 2014-01-29 DIAGNOSIS — R0902 Hypoxemia: Secondary | ICD-10-CM

## 2014-01-29 DIAGNOSIS — R1013 Epigastric pain: Secondary | ICD-10-CM | POA: Insufficient documentation

## 2014-01-29 HISTORY — DX: Obesity, unspecified: E66.9

## 2014-01-29 LAB — I-STAT CG4 LACTIC ACID, ED: LACTIC ACID, VENOUS: 1.31 mmol/L (ref 0.5–2.0)

## 2014-01-29 LAB — CBG MONITORING, ED: GLUCOSE-CAPILLARY: 229 mg/dL — AB (ref 70–99)

## 2014-01-29 MED ORDER — ONDANSETRON 4 MG PO TBDP
8.0000 mg | ORAL_TABLET | Freq: Once | ORAL | Status: AC
  Administered 2014-01-29: 8 mg via ORAL
  Filled 2014-01-29: qty 2

## 2014-01-29 NOTE — ED Notes (Signed)
Pt reports he fell onto his stub tonight while trying to get onto the EMS stretcher.

## 2014-01-29 NOTE — ED Provider Notes (Signed)
CSN: [id number]     Arrival date & time 01/29/14  2243 History  This chart was scribed for Kalman Drape, MD by Rayfield Citizen, ED Scribe. This patient was seen in room D36C/D36C and the patient's care was started at 11:14 PM.    Chief Complaint  Patient presents with  . Emesis   Patient is a 39 y.o. male presenting with vomiting. The history is provided by the patient. No language interpreter was used.  Emesis Associated symptoms: abdominal pain   Associated symptoms: no diarrhea      HPI Comments: Christoph Spaulding is a 39 y.o. male with past medical history of DM, HTN, HLD, blood clots (DVT, managed with coumadin) who presents to the Emergency Department complaining of 1 day of nausea, vomiting, productive cough and dizziness. He reports sharp pains in the abdomen. He denies diarrhea, fever, suspicious food intake, sick contacts with similar symptoms. He denies a history of asthma or gastroparesis.  Patient reports he fell onto his stump tonight while trying to get onto the EMS stretcher. He had his right leg amputated below the knee on 12/22/13 (Dr. Doran Durand) and was sent home from rehab 10 days PTA; he reports soreness, swelling and drainage from his surgical site. He notes that he changes his dressings daily. He is scheduled to see Dr. Doran Durand on 2/12; he has not been in contact with him regarding this issue.   Patient has a history of DM; he has been unable to eat today due to his current symptoms. His primary care is managed by the [facility]. He is a nonsmoker.    Past Medical History  Diagnosis Date  . DVT (deep venous thrombosis) 09/2002    patient reports additional DVTs in '06 & '11 (unconfirmed)  . Pulmonary embolism 09/2002    treated with 6 months of warfarin  . Hypertension   . Chest pain, neg MI, normal coronaries by cath 02/18/2013  . Hyperlipidemia 02/19/2013  . Acute venous embolism and thrombosis of deep vessels of proximal lower extremity 07/19/2011  . Type I diabetes mellitus dx'd 2001   . GERD (gastroesophageal reflux disease)   . Diabetic ulcer of right foot   . CKD (chronic kidney disease) stage 3, GFR 30-59 ml/min 02/19/2013  . Nephrotic syndrome    Past Surgical History  Procedure Laterality Date  . Incision and drainage abscess  2007; 2015    "back"  . Cardiac catheterization  02/18/2013    normal coronaries  . I&d extremity Right 12/14/2013    Procedure: IRRIGATION AND DEBRIDEMENT RIGHT FOOT;  Surgeon: [name], MD;  Location: [facility];  Service: Orthopedics;  Laterality: Right;  . Left heart catheterization with coronary angiogram N/A 02/18/2013    Procedure: LEFT HEART CATHETERIZATION WITH CORONARY ANGIOGRAM;  Surgeon: [name], MD;  Location: [facility] CATH LAB;  Service: Cardiovascular;  Laterality: N/A;  . Amputation Right 12/22/2013    Procedure: AMPUTATION BELOW KNEE;  Surgeon: [name], MD;  Location: [place];  Service: Orthopedics;  Laterality: Right;  . Incision and drainage of wound Right 12/22/2013    Procedure: I&D RIGHT BUTTOCK;  Surgeon: [name], MD;  Location: [place];  Service: Orthopedics;  Laterality: Right;   Family History  Problem Relation Age of Onset  . Heart disease    . Diabetes    . Obesity     History  Substance Use Topics  . Smoking status: Never Smoker   . Smokeless tobacco: Never Used  . Alcohol Use: Yes  Comment: 12/09/2013 "a few beers q 6 months or so"    Review of Systems  Constitutional: Negative for fever.  Respiratory: Positive for cough.   Gastrointestinal: Positive for nausea, vomiting and abdominal pain. Negative for diarrhea.  Neurological: Positive for dizziness.  All other systems reviewed and are negative.   Allergies  Review of patient's allergies indicates no known allergies.  Home Medications   Prior to Admission medications   Medication Sig Start Date End Date Taking? Authorizing Provider  acetaminophen (TYLENOL) 325 MG tablet Take 1-2 tablets (325-650 mg total) by mouth every 4 (four)  hours as needed for mild pain. 01/20/14   Bary Leriche, PA-C  ALPRAZolam (XANAX) 0.25 MG tablet Take 1 tablet (0.25 mg total) by mouth 2 (two) times daily as needed for anxiety. 01/20/14   Ivan Anchors Love, PA-C  amLODipine (NORVASC) 10 MG tablet Take 1 tablet (10 mg total) by mouth daily. 01/20/14   Bary Leriche, PA-C  calcium carbonate (TUMS - DOSED IN MG ELEMENTAL CALCIUM) 500 MG chewable tablet Chew 1 tablet (200 mg of elemental calcium total) by mouth 3 (three) times daily with meals. 01/20/14   Bary Leriche, PA-C  carvedilol (COREG) 12.5 MG tablet Take 1 tablet (12.5 mg total) by mouth 2 (two) times daily with a meal. 01/20/14   Ivan Anchors Love, PA-C  escitalopram (LEXAPRO) 20 MG tablet Take 1 tablet (20 mg total) by mouth at bedtime. 01/20/14   Bary Leriche, PA-C  furosemide (LASIX) 40 MG tablet Take 1 tablet (40 mg total) by mouth daily. 01/20/14   Bary Leriche, PA-C  gabapentin (NEURONTIN) 300 MG capsule Take 1 capsule (300 mg total) by mouth 3 (three) times daily. 01/20/14   Bary Leriche, PA-C  insulin NPH-regular Human (NOVOLIN 70/30) (70-30) 100 UNIT/ML injection Inject 10 Units into the skin 2 (two) times daily with a meal. 01/20/14   Ivan Anchors Love, PA-C  levofloxacin (LEVAQUIN) 250 MG tablet Take 1 tablet (250 mg total) by mouth daily. 01/20/14   Bary Leriche, PA-C  lovastatin (MEVACOR) 20 MG tablet Take 1 tablet (20 mg total) by mouth daily. 08/14/13 08/14/14  Lucious Groves, DO  methocarbamol (ROBAXIN) 500 MG tablet Take 1 tablet (500 mg total) by mouth every 6 (six) hours as needed for muscle spasms. 01/20/14   Bary Leriche, PA-C  Multiple Vitamin (MULTIVITAMIN WITH MINERALS) TABS tablet Take 1 tablet by mouth daily. 01/20/14   Bary Leriche, PA-C  nortriptyline (PAMELOR) 10 MG capsule Take 1 capsule (10 mg total) by mouth at bedtime. 01/20/14   Bary Leriche, PA-C  oxyCODONE (ROXICODONE) 15 MG immediate release tablet Take 1 tablet (15 mg total) by mouth every 6 (six) hours as needed for severe  pain. 01/20/14   Ivan Anchors Love, PA-C  pantoprazole (PROTONIX) 40 MG tablet Take 1 tablet (40 mg total) by mouth 2 (two) times daily. 01/20/14   Bary Leriche, PA-C  ranitidine (ZANTAC) 300 MG tablet Take 1 tablet (300 mg total) by mouth at bedtime. 08/14/13 08/14/14  Lucious Groves, DO  senna-docusate (SENOKOT-S) 8.6-50 MG per tablet Take 2 tablets by mouth 2 (two) times daily. 01/20/14   Bary Leriche, PA-C  tamsulosin (FLOMAX) 0.4 MG CAPS capsule Take 1 capsule (0.4 mg total) by mouth daily after supper. 01/20/14   Bary Leriche, PA-C  warfarin (COUMADIN) 5 MG tablet Take 1.5 tablets (7.5 mg) every day with supper starting tomorrow. 01/20/14   Olin Hauser  S Love, PA-C   BP 153/62 mmHg  Pulse 108  Temp(Src) 98.5 F (36.9 C) (Oral)  Resp 20  Ht 6\' 2"  (1.88 m)  Wt 332 lb (150.594 kg)  BMI 42.61 kg/m2  SpO2 94% Physical Exam  Constitutional: He is oriented to person, place, and time. He appears well-developed and well-nourished. No distress.  HENT:  Head: Normocephalic and atraumatic.  Nose: Nose normal.  Mouth/Throat: Oropharynx is clear and moist. No oropharyngeal exudate.  Eyes: EOM are normal. Pupils are equal, round, and reactive to light.  Neck: Normal range of motion. Neck supple.  Cardiovascular: Regular rhythm, normal heart sounds and intact distal pulses.  Exam reveals no gallop and no friction rub.   No murmur heard. Tachycardia noted  Pulmonary/Chest: Effort normal. No respiratory distress. He has no wheezes. He has no rales.  Abdominal: Soft. There is tenderness (diffuse tenderness to palpation). There is no rebound and no guarding.  Hyperactive bowel sounds  Musculoskeletal: Normal range of motion. He exhibits no edema.  Please see pictures attached for right BKA.  There is warmth but no fluctuance, no drainage of pus.  There is a large area of eschar.  There is a small amount of bleeding from the medial aspect of the incision, patient reports secondary to hitting it this evening   Neurological: He is alert and oriented to person, place, and time.  Skin: Skin is warm and dry. No rash noted. No erythema. No pallor.  Psychiatric: He has a normal mood and affect. His behavior is normal. Judgment and thought content normal.  Nursing note and vitals reviewed.          ED Course  Procedures   DIAGNOSTIC STUDIES: Oxygen Saturation is 94% on RA, low by my interpretation.    COORDINATION OF CARE: 11:31 M Discussed treatment plan with pt at bedside and pt agreed to plan.   Labs Review Labs Reviewed  CBC WITH DIFFERENTIAL - Abnormal; Notable for the following:    WBC 12.9 (*)    RBC 2.91 (*)    Hemoglobin 7.7 (*)    HCT 24.0 (*)    Platelets 701 (*)    Neutro Abs 9.2 (*)    Monocytes Absolute 1.1 (*)    All other components within normal limits  COMPREHENSIVE METABOLIC PANEL - Abnormal; Notable for the following:    Glucose, Bld 224 (*)    Creatinine, Ser 1.72 (*)    Albumin 2.0 (*)    Alkaline Phosphatase 177 (*)    GFR calc non Af Amer 49 (*)    GFR calc Af Amer 57 (*)    All other components within normal limits  PROTIME-INR - Abnormal; Notable for the following:    Prothrombin Time 59.8 (*)    INR 6.84 (*)    All other components within normal limits  URINALYSIS, ROUTINE W REFLEX MICROSCOPIC - Abnormal; Notable for the following:    Glucose, UA 250 (*)    Hgb urine dipstick MODERATE (*)    Protein, ur >300 (*)    All other components within normal limits  URINE MICROSCOPIC-ADD ON - Abnormal; Notable for the following:    Casts WAXY CAST (*)    All other components within normal limits  CBG MONITORING, ED - Abnormal; Notable for the following:    Glucose-Capillary 229 (*)    All other components within normal limits  LIPASE, BLOOD  I-STAT CG4 LACTIC ACID, ED  POC OCCULT BLOOD, ED  PREPARE RBC (CROSSMATCH)  TYPE  AND SCREEN    Imaging Review No results found.   EKG Interpretation None      MDM   Final diagnoses:  Nausea and  vomiting, vomiting of unspecified type  Symptomatic anemia  Elevated INR  Amputation of right lower extremity below knee    I personally performed the services described in this documentation, which was scribed in my presence. The recorded information has been reviewed and is accurate.  39 year old male with nausea and vomiting today, over one month out from right BKA.  Stump with some eschar, warmth, but no fluctuance or signs of acute infection.  Will check labs, monitor for DKA, sepsis or infection.  Patient appears uncomfortable but not acutely ill.   Labs show drop in hemoglobin after blood transfusion on the 10th, elevated INR.  Girlfriend reports that patient has had persistent oozing of blood from the stump.  Discussed with internal medicine residents who will admit.  We'll plan to transfuse and give oral vitamin K.  He will need ortho evaluation in the morning.     Kalman Drape, MD 01/30/14 [phone]

## 2014-01-29 NOTE — ED Notes (Signed)
Pt reports nausea/vomiting, pt reports at least 12 episodes of vomiting in the past 24 hours, pt denies diarrhea/fever. Pt had a BKA of the right leg on Dec. 14, 2015 r/t MRSA. A&O X4. CBG 338.

## 2014-01-29 NOTE — Telephone Encounter (Signed)
Yes, if she can do that.

## 2014-01-29 NOTE — Telephone Encounter (Signed)
Call from [facility] with [facility] - # [phone]  Nurse is seeing pt in home for wound care.  Pt is on coumadin and nurse is asking if you want her to do INR when she sees him? Hospital d/c 1/12

## 2014-01-30 ENCOUNTER — Encounter (HOSPITAL_COMMUNITY): Payer: Self-pay | Admitting: General Practice

## 2014-01-30 ENCOUNTER — Observation Stay (HOSPITAL_COMMUNITY): Payer: Medicaid Other

## 2014-01-30 DIAGNOSIS — R932 Abnormal findings on diagnostic imaging of liver and biliary tract: Secondary | ICD-10-CM

## 2014-01-30 DIAGNOSIS — K8070 Calculus of gallbladder and bile duct without cholecystitis without obstruction: Secondary | ICD-10-CM

## 2014-01-30 DIAGNOSIS — I739 Peripheral vascular disease, unspecified: Secondary | ICD-10-CM

## 2014-01-30 DIAGNOSIS — F39 Unspecified mood [affective] disorder: Secondary | ICD-10-CM

## 2014-01-30 DIAGNOSIS — R110 Nausea: Secondary | ICD-10-CM

## 2014-01-30 DIAGNOSIS — D649 Anemia, unspecified: Secondary | ICD-10-CM

## 2014-01-30 DIAGNOSIS — K219 Gastro-esophageal reflux disease without esophagitis: Secondary | ICD-10-CM

## 2014-01-30 DIAGNOSIS — E785 Hyperlipidemia, unspecified: Secondary | ICD-10-CM

## 2014-01-30 DIAGNOSIS — R1013 Epigastric pain: Secondary | ICD-10-CM

## 2014-01-30 DIAGNOSIS — I129 Hypertensive chronic kidney disease with stage 1 through stage 4 chronic kidney disease, or unspecified chronic kidney disease: Secondary | ICD-10-CM

## 2014-01-30 DIAGNOSIS — N183 Chronic kidney disease, stage 3 (moderate): Secondary | ICD-10-CM

## 2014-01-30 DIAGNOSIS — R791 Abnormal coagulation profile: Secondary | ICD-10-CM | POA: Diagnosis present

## 2014-01-30 DIAGNOSIS — E109 Type 1 diabetes mellitus without complications: Secondary | ICD-10-CM | POA: Diagnosis not present

## 2014-01-30 DIAGNOSIS — E1065 Type 1 diabetes mellitus with hyperglycemia: Secondary | ICD-10-CM

## 2014-01-30 DIAGNOSIS — R109 Unspecified abdominal pain: Secondary | ICD-10-CM | POA: Diagnosis not present

## 2014-01-30 DIAGNOSIS — Z89511 Acquired absence of right leg below knee: Secondary | ICD-10-CM

## 2014-01-30 LAB — CBC
HEMATOCRIT: 23.9 % — AB (ref 39.0–52.0)
Hemoglobin: 7.7 g/dL — ABNORMAL LOW (ref 13.0–17.0)
MCH: 26.7 pg (ref 26.0–34.0)
MCHC: 32.2 g/dL (ref 30.0–36.0)
MCV: 83 fL (ref 78.0–100.0)
PLATELETS: 590 10*3/uL — AB (ref 150–400)
RBC: 2.88 MIL/uL — ABNORMAL LOW (ref 4.22–5.81)
RDW: 14.5 % (ref 11.5–15.5)
WBC: 10.6 10*3/uL — ABNORMAL HIGH (ref 4.0–10.5)

## 2014-01-30 LAB — CBC WITH DIFFERENTIAL/PLATELET
BASOS ABS: 0 10*3/uL (ref 0.0–0.1)
Basophils Relative: 0 % (ref 0–1)
Eosinophils Absolute: 0.1 10*3/uL (ref 0.0–0.7)
Eosinophils Relative: 1 % (ref 0–5)
HCT: 24 % — ABNORMAL LOW (ref 39.0–52.0)
HEMOGLOBIN: 7.7 g/dL — AB (ref 13.0–17.0)
Lymphocytes Relative: 20 % (ref 12–46)
Lymphs Abs: 2.6 10*3/uL (ref 0.7–4.0)
MCH: 26.5 pg (ref 26.0–34.0)
MCHC: 32.1 g/dL (ref 30.0–36.0)
MCV: 82.5 fL (ref 78.0–100.0)
Monocytes Absolute: 1.1 10*3/uL — ABNORMAL HIGH (ref 0.1–1.0)
Monocytes Relative: 8 % (ref 3–12)
Neutro Abs: 9.2 10*3/uL — ABNORMAL HIGH (ref 1.7–7.7)
Neutrophils Relative %: 71 % (ref 43–77)
Platelets: 701 10*3/uL — ABNORMAL HIGH (ref 150–400)
RBC: 2.91 MIL/uL — ABNORMAL LOW (ref 4.22–5.81)
RDW: 14.8 % (ref 11.5–15.5)
WBC: 12.9 10*3/uL — ABNORMAL HIGH (ref 4.0–10.5)

## 2014-01-30 LAB — URINALYSIS, ROUTINE W REFLEX MICROSCOPIC
Bilirubin Urine: NEGATIVE
Glucose, UA: 250 mg/dL — AB
KETONES UR: NEGATIVE mg/dL
Leukocytes, UA: NEGATIVE
NITRITE: NEGATIVE
Protein, ur: >300 mg/dL — AB
Specific Gravity, Urine: 1.013 (ref 1.005–1.030)
Urobilinogen, UA: 0.2 mg/dL (ref 0.0–1.0)
pH: 7 (ref 5.0–8.0)

## 2014-01-30 LAB — GLUCOSE, CAPILLARY
GLUCOSE-CAPILLARY: 150 mg/dL — AB (ref 70–99)
GLUCOSE-CAPILLARY: 164 mg/dL — AB (ref 70–99)
GLUCOSE-CAPILLARY: 171 mg/dL — AB (ref 70–99)
Glucose-Capillary: 191 mg/dL — ABNORMAL HIGH (ref 70–99)
Glucose-Capillary: 190 mg/dL — ABNORMAL HIGH (ref 70–99)
Glucose-Capillary: 188 mg/dL — ABNORMAL HIGH (ref 70–99)

## 2014-01-30 LAB — COMPREHENSIVE METABOLIC PANEL
ALBUMIN: 2 g/dL — AB (ref 3.5–5.2)
ALBUMIN: 1.8 g/dL — AB (ref 3.5–5.2)
ALK PHOS: 162 U/L — AB (ref 39–117)
ALT: 23 U/L (ref 0–53)
ALT: 20 U/L (ref 0–53)
ANION GAP: 8 (ref 5–15)
AST: 17 U/L (ref 0–37)
AST: 15 U/L (ref 0–37)
Alkaline Phosphatase: 177 U/L — ABNORMAL HIGH (ref 39–117)
Anion gap: 7 (ref 5–15)
BUN: 22 mg/dL (ref 6–23)
BUN: 20 mg/dL (ref 6–23)
CALCIUM: 8.6 mg/dL (ref 8.4–10.5)
CO2: 23 mmol/L (ref 19–32)
CO2: 25 mmol/L (ref 19–32)
CREATININE: 1.69 mg/dL — AB (ref 0.50–1.35)
Calcium: 8.8 mg/dL (ref 8.4–10.5)
Chloride: 104 mEq/L (ref 96–112)
Chloride: 105 mmol/L (ref 96–112)
Creatinine, Ser: 1.72 mg/dL — ABNORMAL HIGH (ref 0.50–1.35)
GFR calc Af Amer: 57 mL/min — ABNORMAL LOW (ref >90)
GFR calc non Af Amer: 49 mL/min — ABNORMAL LOW (ref >90)
GFR calc non Af Amer: 50 mL/min — ABNORMAL LOW (ref >90)
GFR, EST AFRICAN AMERICAN: 58 mL/min — AB (ref >90)
GLUCOSE: 205 mg/dL — AB (ref 70–99)
Glucose, Bld: 224 mg/dL — ABNORMAL HIGH (ref 70–99)
POTASSIUM: 4.3 mmol/L (ref 3.5–5.1)
Potassium: 4.6 mmol/L (ref 3.5–5.1)
SODIUM: 137 mmol/L (ref 135–145)
Sodium: 135 mmol/L (ref 135–145)
Total Bilirubin: 0.5 mg/dL (ref 0.3–1.2)
Total Bilirubin: 0.5 mg/dL (ref 0.3–1.2)
Total Protein: 7.2 g/dL (ref 6.0–8.3)
Total Protein: 6.9 g/dL (ref 6.0–8.3)

## 2014-01-30 LAB — PROTIME-INR
INR: 6.84 (ref 0.00–1.49)
INR: 3.95 — ABNORMAL HIGH (ref 0.00–1.49)
Prothrombin Time: 59.8 seconds — ABNORMAL HIGH (ref 11.6–15.2)
Prothrombin Time: 38.9 seconds — ABNORMAL HIGH (ref 11.6–15.2)

## 2014-01-30 LAB — PREPARE RBC (CROSSMATCH)

## 2014-01-30 LAB — HEMOGLOBIN A1C
HEMOGLOBIN A1C: 6.8 % — AB (ref <5.7)
Mean Plasma Glucose: 148 mg/dL — ABNORMAL HIGH (ref <117)

## 2014-01-30 LAB — URINE MICROSCOPIC-ADD ON

## 2014-01-30 LAB — MRSA PCR SCREENING: MRSA by PCR: NEGATIVE

## 2014-01-30 LAB — POC OCCULT BLOOD, ED: Fecal Occult Bld: NEGATIVE

## 2014-01-30 LAB — LIPASE, BLOOD: Lipase: 39 U/L (ref 11–59)

## 2014-01-30 MED ORDER — INSULIN ASPART 100 UNIT/ML ~~LOC~~ SOLN
0.0000 [IU] | Freq: Three times a day (TID) | SUBCUTANEOUS | Status: DC
  Administered 2014-01-31: 3 [IU] via SUBCUTANEOUS
  Administered 2014-02-01 – 2014-02-02 (×3): 2 [IU] via SUBCUTANEOUS
  Administered 2014-02-02 – 2014-02-03 (×4): 3 [IU] via SUBCUTANEOUS
  Administered 2014-02-03: 8 [IU] via SUBCUTANEOUS
  Administered 2014-02-04: 5 [IU] via SUBCUTANEOUS
  Administered 2014-02-04: 3 [IU] via SUBCUTANEOUS

## 2014-01-30 MED ORDER — FUROSEMIDE 40 MG PO TABS
40.0000 mg | ORAL_TABLET | Freq: Every day | ORAL | Status: DC
Start: 2014-01-30 — End: 2014-02-04
  Administered 2014-01-31 – 2014-02-04 (×5): 40 mg via ORAL
  Filled 2014-01-30 (×6): qty 1

## 2014-01-30 MED ORDER — INSULIN ASPART 100 UNIT/ML ~~LOC~~ SOLN: 0.0000 [IU] | Freq: Every day | SUBCUTANEOUS | Status: DC

## 2014-01-30 MED ORDER — SODIUM CHLORIDE 0.9 % IV SOLN: 10.0000 mL/h | Freq: Once | INTRAVENOUS | Status: DC

## 2014-01-30 MED ORDER — SODIUM CHLORIDE 0.9 % IV SOLN
INTRAVENOUS | Status: DC
  Administered 2014-01-30: 75 mL/h via INTRAVENOUS
  Administered 2014-01-31: 04:00:00 via INTRAVENOUS

## 2014-01-30 MED ORDER — NORTRIPTYLINE HCL 10 MG PO CAPS
10.0000 mg | ORAL_CAPSULE | Freq: Every day | ORAL | Status: DC
  Administered 2014-01-30 – 2014-02-03 (×5): 10 mg via ORAL
  Filled 2014-01-30 (×7): qty 1

## 2014-01-30 MED ORDER — CARVEDILOL 12.5 MG PO TABS
12.5000 mg | ORAL_TABLET | Freq: Two times a day (BID) | ORAL | Status: DC
  Administered 2014-01-30 – 2014-02-01 (×3): 12.5 mg via ORAL
  Filled 2014-01-30 (×7): qty 1

## 2014-01-30 MED ORDER — ONDANSETRON 4 MG PO TBDP
4.0000 mg | ORAL_TABLET | Freq: Three times a day (TID) | ORAL | Status: DC | PRN
  Administered 2014-01-31 – 2014-02-01 (×2): 4 mg via ORAL
  Filled 2014-01-30 (×6): qty 1

## 2014-01-30 MED ORDER — PANTOPRAZOLE SODIUM 40 MG PO TBEC
40.0000 mg | DELAYED_RELEASE_TABLET | Freq: Two times a day (BID) | ORAL | Status: DC
  Administered 2014-01-30 – 2014-02-04 (×10): 40 mg via ORAL
  Filled 2014-01-30 (×10): qty 1

## 2014-01-30 MED ORDER — PRAVASTATIN SODIUM 20 MG PO TABS
20.0000 mg | ORAL_TABLET | Freq: Every day | ORAL | Status: DC
  Administered 2014-01-30 – 2014-02-03 (×5): 20 mg via ORAL
  Filled 2014-01-30 (×6): qty 1

## 2014-01-30 MED ORDER — TAMSULOSIN HCL 0.4 MG PO CAPS
0.4000 mg | ORAL_CAPSULE | Freq: Every day | ORAL | Status: DC
  Administered 2014-01-31 – 2014-02-03 (×4): 0.4 mg via ORAL
  Filled 2014-01-30 (×6): qty 1

## 2014-01-30 MED ORDER — SENNOSIDES-DOCUSATE SODIUM 8.6-50 MG PO TABS
2.0000 | ORAL_TABLET | Freq: Two times a day (BID) | ORAL | Status: DC
  Administered 2014-01-30 – 2014-02-01 (×4): 2 via ORAL
  Filled 2014-01-30 (×6): qty 2

## 2014-01-30 MED ORDER — PHYTONADIONE 5 MG PO TABS
5.0000 mg | ORAL_TABLET | Freq: Once | ORAL | Status: AC
  Administered 2014-01-30: 5 mg via ORAL
  Filled 2014-01-30 (×2): qty 1

## 2014-01-30 MED ORDER — CALCIUM CARBONATE ANTACID 500 MG PO CHEW
1.0000 | CHEWABLE_TABLET | Freq: Three times a day (TID) | ORAL | Status: DC
  Administered 2014-01-30 – 2014-02-04 (×14): 200 mg via ORAL
  Filled 2014-01-30 (×19): qty 1

## 2014-01-30 MED ORDER — AMLODIPINE BESYLATE 10 MG PO TABS
10.0000 mg | ORAL_TABLET | Freq: Every day | ORAL | Status: DC
  Administered 2014-01-31 – 2014-02-04 (×5): 10 mg via ORAL
  Filled 2014-01-30 (×6): qty 1

## 2014-01-30 MED ORDER — ONDANSETRON HCL 4 MG/2ML IJ SOLN
4.0000 mg | Freq: Once | INTRAMUSCULAR | Status: AC
Start: 2014-01-30 — End: 2014-01-30
  Administered 2014-01-30: 4 mg via INTRAVENOUS
  Filled 2014-01-30: qty 2

## 2014-01-30 MED ORDER — GABAPENTIN 300 MG PO CAPS
300.0000 mg | ORAL_CAPSULE | Freq: Three times a day (TID) | ORAL | Status: DC
  Administered 2014-01-30 – 2014-02-04 (×14): 300 mg via ORAL
  Filled 2014-01-30 (×18): qty 1

## 2014-01-30 MED ORDER — MORPHINE SULFATE 4 MG/ML IJ SOLN
4.0000 mg | Freq: Once | INTRAMUSCULAR | Status: AC
Start: 2014-01-30 — End: 2014-01-30
  Administered 2014-01-30: 4 mg via INTRAVENOUS
  Filled 2014-01-30: qty 1

## 2014-01-30 MED ORDER — OXYCODONE HCL 5 MG PO TABS
15.0000 mg | ORAL_TABLET | Freq: Four times a day (QID) | ORAL | Status: DC | PRN
  Administered 2014-01-30 – 2014-02-04 (×10): 15 mg via ORAL
  Filled 2014-01-30 (×10): qty 3

## 2014-01-30 MED ORDER — ADULT MULTIVITAMIN W/MINERALS CH
1.0000 | ORAL_TABLET | Freq: Every day | ORAL | Status: DC
  Administered 2014-01-31 – 2014-02-04 (×5): 1 via ORAL
  Filled 2014-01-30 (×6): qty 1

## 2014-01-30 MED ORDER — SODIUM CHLORIDE 0.9 % IJ SOLN
3.0000 mL | Freq: Two times a day (BID) | INTRAMUSCULAR | Status: DC
Start: 2014-01-30 — End: 2014-02-04
  Administered 2014-01-30 – 2014-02-03 (×6): 3 mL via INTRAVENOUS

## 2014-01-30 MED ORDER — INSULIN ASPART 100 UNIT/ML ~~LOC~~ SOLN
0.0000 [IU] | SUBCUTANEOUS | Status: DC
  Administered 2014-01-30 (×3): 3 [IU] via SUBCUTANEOUS

## 2014-01-30 MED ORDER — ESCITALOPRAM OXALATE 20 MG PO TABS
20.0000 mg | ORAL_TABLET | Freq: Every day | ORAL | Status: DC
  Administered 2014-01-30 – 2014-02-03 (×5): 20 mg via ORAL
  Filled 2014-01-30 (×6): qty 1

## 2014-01-30 NOTE — Plan of Care (Signed)
Problem: Phase I Progression Outcomes Goal: Initial discharge plan identified Outcome: Progressing Pt anticipating going to SNF at discharge

## 2014-01-30 NOTE — Progress Notes (Signed)
Inpatient Diabetes Program Recommendations  AACE/ADA: New Consensus Statement on Inpatient Glycemic Control (2013)  Target Ranges:  Prepandial:   less than 140 mg/dL      Peak postprandial:   less than 180 mg/dL (1-2 hours)      Critically ill patients:  140 - 180 mg/dL   Reason for Visit: Hyperglycemia in setting  s/p BKA and I and D of buttocks Current orders for Inpatient glycemicy control: moderate correction q 4 hrsCurrent orders for Inpatient glycemicy control: moderate correction q 4 hrs Home meds: 70/30 50 and 45 units for total NPH dose of 63 units. Pt would benefit from addition of basal insuin at this time. Please consider addition of basal lantus/levemir 20 units to start in addition to correction q 4 hrs   Thank you, Rosita Kea, RN, CNS, Diabetes Coordinator [phone])

## 2014-01-30 NOTE — Progress Notes (Addendum)
PT Cancellation Note  Patient Details Name: [name] MRN: [id number] DOB: 26-Jun-1975   Cancelled Treatment:    Reason Eval/Treat Not Completed: Medical issues which prohibited therapy.  Patient with INR of 6.84 and with fall risk.  Will hold PT today, and return tomorrow for PT evaluation as appropriate.   Despina Pole 01/30/2014, 1:16 PM Carita Pian. Sanjuana Kava, [place] Pager [phone]

## 2014-01-30 NOTE — Consult Note (Signed)
[facility] Consult: 11:29 AM 01/30/2014  LOS: 1 day    Referring Provider: Dr Marinda Elk  Primary Care Physician:  Lucious Groves, DO Primary Gastroenterologist:  unassigned    Reason for Consultation:  Anemia.     HPI: [name] is a 39 y.o. male.  Patient has a lot of medical problems for someone of his age. He's a morbidly obese type I diabetic who is on Coumadin for history of PE/DVT. 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  Stage III chronic kidney disease.  Patient has chronic anemia.  On several occasions in December, before his amputation as well as after his amputation his hemoglobins reached down to 6.7.  A year ago in February 2015 his hemoglobins ranged from 13-14. Per blood bank discussion he received a total of 9 units PRBCs in December 7 - 17th, 2015. On 01/15/14 he got 2 red cells for hemoglobin of 6.9.  By 01/18/14 hemoglobin was up to 9.4. Today it is 7.7 Patient was admitted yesterday complaining of nausea/vomiting, intermittent abdominal pain. There had been trauma to the amputation stump after a fall at home.  He has received 2 units of packed red cells as well as 5 mg of oral vitamin K for INR of 6.8.    We are asked to evaluate the anemia.  FOBT was checked on 01/16/14 and 01/30/14. Both of these were negative.  Med list reviewed and lists twice a day Protonix, ranitidine 300 mg at at bedtime.   Patient is a poor historian but he does know that he's been taking his Protonix for quite a while. He is not sure that he's taking the ranitidine.   For about a week he's been having pain in the umbilicus region. This isn't radiating and food does not exacerbate the pain.  Patient is hard to pin down as far as length of his symptoms but he does say that he previously had intermittent abdominal pain and  vomiting. In fact on 12/20/14 he had a noncontrast CT scan because of vomiting and abdominal pain. This showed possible sludge versus stones in the fundus of the gallbladder and some mild subcutaneous edema over the abdominal wall. He's had some nausea but not much emesis. No bloody emesis. He said he saw dark stool within the last few days. There was no obvious blood in the stool.  His appetite fluctuates but recently has been depressed.  His alkaline phosphatase is elevated at 177, albumin low at 2.0 but otherwise LFTs are normal. Lipase is normal.       Past Medical History  Diagnosis Date  . DVT (deep venous thrombosis) 09/2002    patient reports additional DVTs in '06 & '11 (unconfirmed)  . Pulmonary embolism 09/2002    treated with 6 months of warfarin  . Hypertension   . Chest pain, neg MI, normal coronaries by cath 02/18/2013  . Hyperlipidemia 02/19/2013  . Acute venous embolism and thrombosis of deep vessels of proximal lower extremity 07/19/2011  . Type I diabetes mellitus dx'd 2001  . GERD (gastroesophageal reflux  disease)   . Diabetic ulcer of right foot   . CKD (chronic kidney disease) stage 3, GFR 30-59 ml/min 02/19/2013  . Nephrotic syndrome     Past Surgical History  Procedure Laterality Date  . Incision and drainage abscess  2007; 2015    "back"  . Cardiac catheterization  02/18/2013    normal coronaries  . I&d extremity Right 12/14/2013    Procedure: IRRIGATION AND DEBRIDEMENT RIGHT FOOT;  Surgeon: [name], MD;  Location: [facility];  Service: Orthopedics;  Laterality: Right;  . Left heart catheterization with coronary angiogram N/A 02/18/2013    Procedure: LEFT HEART CATHETERIZATION WITH CORONARY ANGIOGRAM;  Surgeon: [name], MD;  Location: [facility] C[facility];  Service: Cardiovascular;  Laterality: N/A;  . Amputation Right 12/22/2013    Procedure: AMPUTATION BELOW KNEE;  Surgeon: [name], MD;  Location: [facility];  Service: Orthopedics;  Laterality: Right;  . Incision  and drainage of wound Right 12/22/2013    Procedure: I&D RIGHT BUTTOCK;  Surgeon: [name], MD;  Location: [facility];  Service: Orthopedics;  Laterality: Right;    Prior to Admission medications   Medication Sig Start Date End Date Taking? Authorizing Provider  acetaminophen (TYLENOL) 325 MG tablet Take 1-2 tablets (325-650 mg total) by mouth every 4 (four) hours as needed for mild pain. 01/20/14  Yes Ivan Anchors Love, PA-C  ALPRAZolam (XANAX) 0.25 MG tablet Take 1 tablet (0.25 mg total) by mouth 2 (two) times daily as needed for anxiety. 01/20/14  Yes Ivan Anchors Love, PA-C  amLODipine (NORVASC) 10 MG tablet Take 1 tablet (10 mg total) by mouth daily. 01/20/14  Yes Ivan Anchors Love, PA-C  calcium carbonate (TUMS - DOSED IN MG ELEMENTAL CALCIUM) 500 MG chewable tablet Chew 1 tablet (200 mg of elemental calcium total) by mouth 3 (three) times daily with meals. 01/20/14  Yes Ivan Anchors Love, PA-C  carvedilol (COREG) 12.5 MG tablet Take 1 tablet (12.5 mg total) by mouth 2 (two) times daily with a meal. 01/20/14  Yes Ivan Anchors Love, PA-C  escitalopram (LEXAPRO) 20 MG tablet Take 1 tablet (20 mg total) by mouth at bedtime. 01/20/14  Yes Ivan Anchors Love, PA-C  furosemide (LASIX) 40 MG tablet Take 1 tablet (40 mg total) by mouth daily. 01/20/14  Yes Ivan Anchors Love, PA-C  gabapentin (NEURONTIN) 300 MG capsule Take 1 capsule (300 mg total) by mouth 3 (three) times daily. 01/20/14  Yes Ivan Anchors Love, PA-C  insulin NPH-regular Human (NOVOLIN 70/30) (70-30) 100 UNIT/ML injection Inject 10 Units into the skin 2 (two) times daily with a meal. 01/20/14  Yes Ivan Anchors Love, PA-C  levofloxacin (LEVAQUIN) 250 MG tablet Take 1 tablet (250 mg total) by mouth daily. 01/20/14  Yes Ivan Anchors Love, PA-C  lovastatin (MEVACOR) 20 MG tablet Take 1 tablet (20 mg total) by mouth daily. 08/14/13 08/14/14 Yes Lucious Groves, DO  methocarbamol (ROBAXIN) 500 MG tablet Take 1 tablet (500 mg total) by mouth every 6 (six) hours as needed for muscle spasms. 01/20/14   Yes Ivan Anchors Love, PA-C  Multiple Vitamin (MULTIVITAMIN WITH MINERALS) TABS tablet Take 1 tablet by mouth daily. 01/20/14  Yes Ivan Anchors Love, PA-C  nortriptyline (PAMELOR) 10 MG capsule Take 1 capsule (10 mg total) by mouth at bedtime. 01/20/14  Yes Ivan Anchors Love, PA-C  oxyCODONE (ROXICODONE) 15 MG immediate release tablet Take 1 tablet (15 mg total) by mouth every 6 (six) hours as needed for severe pain. 01/20/14  Yes  Ivan Anchors Love, PA-C  pantoprazole (PROTONIX) 40 MG tablet Take 1 tablet (40 mg total) by mouth 2 (two) times daily. 01/20/14  Yes Ivan Anchors Love, PA-C  ranitidine (ZANTAC) 300 MG tablet Take 1 tablet (300 mg total) by mouth at bedtime. 08/14/13 08/14/14 Yes Lucious Groves, DO  senna-docusate (SENOKOT-S) 8.6-50 MG per tablet Take 2 tablets by mouth 2 (two) times daily. 01/20/14  Yes Ivan Anchors Love, PA-C  tamsulosin (FLOMAX) 0.4 MG CAPS capsule Take 1 capsule (0.4 mg total) by mouth daily after supper. 01/20/14  Yes Ivan Anchors Love, PA-C  warfarin (COUMADIN) 5 MG tablet Take 1.5 tablets (7.5 mg) every day with supper starting tomorrow. 01/20/14  Yes Ivan Anchors Love, PA-C    Scheduled Meds: . sodium chloride  10 mL/hr Intravenous Once  . amLODipine  10 mg Oral Daily  . calcium carbonate  1 tablet Oral TID WC  . carvedilol  12.5 mg Oral BID WC  . escitalopram  20 mg Oral QHS  . furosemide  40 mg Oral Daily  . gabapentin  300 mg Oral TID  . insulin aspart  0-15 Units Subcutaneous 6 times per day  . insulin aspart  0-5 Units Subcutaneous QHS  . multivitamin with minerals  1 tablet Oral Daily  . nortriptyline  10 mg Oral QHS  . pantoprazole  40 mg Oral BID  . pravastatin  20 mg Oral q1800  . senna-docusate  2 tablet Oral BID  . sodium chloride  3 mL Intravenous Q12H  . tamsulosin  0.4 mg Oral QPC supper   Infusions: . sodium chloride     PRN Meds: ondansetron, oxyCODONE   Allergies as of 01/29/2014  . (No Known Allergies)    Family History  Problem Relation Age of Onset  . Heart  disease    . Diabetes    . Obesity      History   Social History  . Marital Status: Single    Spouse Name: N/A    Number of Children: N/A  . Years of Education: 15.5   Occupational History  . [employer]   . student   .     Social History Main Topics  . Smoking status: Never Smoker   . Smokeless tobacco: Never Used  . Alcohol Use: No     Comment: 12/09/2013 "a few beers q 6 months or so"  . Drug Use: No  . Sexual Activity: No   Other Topics Concern  . Not on file   Social History Narrative   Financial assistance approved for 100% discount at [facility] and has [facility] card per [employer]   09/01/2009.      Lives in [place] with mother and sister.                REVIEW OF SYSTEMS: Constitutional:  Patient doesn't know if he's gained weight or not nor if he's lost weight. He's pretty much bed and wheelchair ridden at this point owing to his recent BKA. He sleeps a lot ENT:  No nose bleeds has seen scant streaks of blood when he blows his nose on occasion. Pulm:  No shortness of breath. Nonproductive cough. CV:  No palpitations, no LE edema.  GU:  No hematuria, no frequency GI:  Per history of present illness. Denies dysphagia. Heme:  Per history of present illness   Transfusions:  Her history of present illness Neuro:  No headaches, no peripheral tingling or numbness Derm:  No itching, no rash or sores.  Endocrine:  No sweats or chills.  No polyuria or dysuria Immunization:  Reviewed and up-to-date on flu and pneumococcal vaccine. Travel:  None beyond local counties in last few months.    PHYSICAL EXAM: Vital signs in last 24 hours: Filed Vitals:   01/30/14 1000  BP: 151/78  Pulse: 99  Temp: 98.4 F (36.9 C)  Resp: 20   Wt Readings from Last 3 Encounters:  01/30/14 346 lb 2 oz (157 kg)  01/20/14 350 lb 8.5 oz (159 kg)  12/26/13 385 lb 3.2 oz (174.726 kg)    General: Somnolent, morbidly obese and chronically ill appearing AAM area he looks  comfortable Head:  Obese, symmetric facies. No signs of trauma  Eyes:  No icterus, no conjunctival pallor. Ears:  Hard to say if he is hard of hearing or he's just extremely slow to answer questions  Nose:  No congestion or discharge Mouth:  We're, moist. No blood in the mouth Neck:  Thick, no masses Lungs:  Diminished breath sounds but clear bilaterally. No dyspnea, no cough. Heart: RRR. No MRG. S1-S2 distant but audible. Abdomen:  Obese. Unable to appreciate organomegaly or any masses. Bowel sounds distant but present. No bruits Tender in the upper abdomen more so in the left upper quadrant. No guarding or rebound..   Rectal: Deferred   Musc/Skeltl: Stapled BKA stump somewhat swollen with scant amount of blood seen on the sheets Extremities:  No left lower extremity pitting edema  Neurologic:  No tremor, no obvious limb weakness. Quite somnolent but oriented 3. Skin:  No rash Tattoos:  None seen   Psych:  Affect flat. Not agitated or anxious.  Intake/Output from previous day: 01/21 0701 - 01/22 0700 In: 40 [I.V.:10; Blood:30] Out: 600 [Urine:600] Intake/Output this shift: Total I/O In: 265 [Blood:265] Out: -   LAB RESULTS:  Recent Labs  01/29/14 2313  WBC 12.9*  HGB 7.7*  HCT 24.0*  PLT 701*   BMET Lab Results  Component Value Date   NA 135 01/29/2014   NA 136 01/05/2014   NA 135 12/31/2013   K 4.6 01/29/2014   K 4.1 01/05/2014   K 4.1 12/31/2013   CL 104 01/29/2014   CL 107 01/05/2014   CL 109 12/31/2013   CO2 23 01/29/2014   CO2 23 01/05/2014   CO2 21 12/31/2013   GLUCOSE 224* 01/29/2014   GLUCOSE 113* 01/05/2014   GLUCOSE 86 12/31/2013   BUN 22 01/29/2014   BUN 19 01/05/2014   BUN 21 12/31/2013   CREATININE 1.72* 01/29/2014   CREATININE 2.10* 01/05/2014   CREATININE 2.30* 12/31/2013   CALCIUM 8.8 01/29/2014   CALCIUM 8.0* 01/05/2014   CALCIUM 7.8* 12/31/2013   LFT  Recent Labs  01/29/14 2313  PROT 7.2  ALBUMIN 2.0*  AST 17  ALT 23   ALKPHOS 177*  BILITOT 0.5   PT/INR Lab Results  Component Value Date   INR 6.84* 01/29/2014   INR 4.25* 01/20/2014   INR 3.48* 01/19/2014   Hepatitis Panel No results for input(s): HEPBSAG, HCVAB, HEPAIGM, HEPBIGM in the last 72 hours. C-Diff No components found for: CDIFF Lipase     Component Value Date/Time   LIPASE 39 01/29/2014 2313    Drugs of Abuse     Component Value Date/Time   LABOPIA POSITIVE* 07/12/2011 0038   COCAINSCRNUR NONE DETECTED 07/12/2011 0038   LABBENZ NONE DETECTED 07/12/2011 0038   AMPHETMU NONE DETECTED 07/12/2011 0038   THCU NONE DETECTED 07/12/2011 0038   LABBARB  NONE DETECTED 07/12/2011 0038     RADIOLOGY STUDIES: Dg Chest 2 View  01/30/2014   CLINICAL DATA:  Oxygen desaturation  EXAM: CHEST  2 VIEW  COMPARISON:  01/20/2014  FINDINGS: Marked hypo aeration, with interstitial crowding. No edema or pneumonia is suspected. Heart size is normal. Negative aortic and hilar contours.  IMPRESSION: Marked hypoventilation.   Electronically Signed   By: Jorje Guild M.D.   On: 01/30/2014 05:13   Dg Knee 1-2 Views Right  01/30/2014   CLINICAL DATA:  Status post recent RIGHT below-knee amputation, fall, assess for fracture or osteomyelitis as there is wound tenderness and drainage.  EXAM: RIGHT KNEE - 1-2 VIEW  COMPARISON:  None.  FINDINGS: Status post below-knee amputation, No disc directed bony lesions. No fracture deformity. No dislocation. No subcutaneous gas or radiopaque foreign bodies. Skin staples at the stump.  IMPRESSION: No acute fracture deformity nor dislocation. Status post below-knee amputation without radiographic findings of osteomyelitis.   Electronically Signed   By: Elon Alas   On: 01/30/2014 05:13    ENDOSCOPIC STUDIES: None ever  IMPRESSION:   *  Chronic, transfusion requiring anemia. Previous FOBT tests were negative. Patient reports dark stool within the last week or so but no overt melena, blood per rectum or bloody  emesis.  *  Intermittent now persistent vague, mid abdominal pain. CT scan within the last month showed sludge versus stones in the gallbladder. LFTs, lipase unremarkable with the exception of elevated alkaline phosphatase which is not unusual in someone who underwent BKA within the last 5 weeks.  *  DM type I.  *  History of recurrent DVT/PE.  On Coumadin at home.  Has received oral vitamin K because of INR 6.8. Note that his INR 10 days ago was 3.4 and 4.2.  *  CKD.  *  Status post right BKA 12/22/2013 due to wet gangrene, foot abscesses    PLAN:     *  Per Dr. Henrene Pastor.  Allow clears.    Azucena Freed  01/30/2014, 11:29 AM Pager: [phone]  GI ATTENDING  History, laboratories, x-rays reviewed. Patient personally seen and examined. Agree with H&P as above. Asked to evaluate for chronic normocytic anemia and vague dominant pain of one days duration. Patient does have GERD by history.  IMPRESSION 1. Chronic normocytic anemia. Most certainly anemia of chronic disease and related to his kidneys. No evidence for GI blood loss. Markedly elevated ferritin. Hemoccult negative stool multiple occasions 2. The abdominal discomfort. Not affected by meals. 3. GERD 4. Cholelithiasis  RECOMMENDATIONS 1. Hematology evaluation for significant recurrent chronic anemia without GI bleeding. Does he need Aranesp? No indication for endoscopic evaluation at present 2. Twice a day PPI to treat GERD and see if abdominal complaints resolved 3. If abdominal complaints persist despite PPI therapy, consider HIDA scan to evaluate the gallbladder. If abnormal, consider surgical consultation  Call for questions or new problems. Will sign off.  Docia Chuck. Geri Seminole., M.D. [facility] Division of Gastroenterology

## 2014-01-30 NOTE — H&P (Signed)
Date: 01/30/2014               Patient Name:  [name] MRN: [id number]  DOB: 1975/07/19 Age / Sex: 39 y.o., male   PCP: Lucious Groves, DO         Medical Service: Internal Medicine Teaching Service         Attending Physician: Dr. Axel Filler, MD    First Contact: Dr. Charlott Rakes Pager: [phone]  Second Contact: Dr. Bing Neighbors Pager: [phone]       After Hours (After 5p/  First Contact Pager: [phone]  weekends / holidays): Second Contact Pager: [phone]   Chief Complaint: abdominal pain  History of Present Illness: Mr Kienitz is a 39 year old man with DM1, previous DVTs on warfarin, HTN, CKD3, HL s/p R BKA 2/2 wet gangrene 12/22/13. Mr Galka complains of one day nausea, non-bloody emesis, and dizziness. He had a diffuse abdominal pain he described as sharp. His nausea, emesis, and abdominal pain had resolved when he was interviewed and examined. He also reports that he fell on his stub trying to get onto EMS stretcher. He also says he has soreness, swelling, and drainage of the surgical site. In the ED, he was noted to have an INR of 6.84 and hemoglobin 7.7 down from 9.4 two days ago. He also noted a dark appearance to his stool today on further questioning. He denied any fevers, chills, night sweats, chest pain, cough, diarrhea, constipation, dysuria. He received morphine 4 mg iv once, Zofran 4 mg po and 4 mg iv.  Of note, he originally presented on 12/09/13 with DKA. During that admission, he underwent R BKA 2/2 wet gangrene by Dr. [name] after multiple irrigation and debridement surgeries for his R forefoot abscesses with progression despite these interventions and intravenous antibiotics. He also had I&D of R buttock abscess and given two additional weeks of vancomycin. He tolerated the procedure well with approximately 450 cc blood lose and no complications. He was discharged to CIR on 12/18 and remained there until 01/20/14.   While at [facility], he had Lexapro added for  mood stabilization. He had several hypoglycemic episodes and his lantus was discontinued and low dose 70/30 was started and titrated to 10 u bid. He completed his antibiotic regimen. Reactive leukocytosis resolved and the BKA appeared to be healing well with staples intact and no signs or symptoms of infection. He was transfused with 2 U PRBC on 1/13 for persistent anemia with a drop in hemoglobin to 6.8 that then improved to 9.4. He developed bronchitis before discharge and was started on zosyn 1/8 but he was discharged on Levaquin to complete a one week course of antibiotic. He also had low dose Elavil added to augment his Neurontin for neuropathic pain. He was discharged with home health RN.   Meds: Current Facility-Administered Medications  Medication Dose Route Frequency Provider Last Rate Last Dose  . 0.9 %  sodium chloride infusion  10 mL/hr Intravenous Once Kalman Drape, MD      . ondansetron (ZOFRAN-ODT) disintegrating tablet 4 mg  4 mg Oral Q8H PRN Kelby Aline, MD      . phytonadione (VITAMIN K) tablet 5 mg  5 mg Oral Once Kalman Drape, MD       Current Outpatient Prescriptions  Medication Sig Dispense Refill  . acetaminophen (TYLENOL) 325 MG tablet Take 1-2 tablets (325-650 mg total) by mouth every 4 (four) hours as needed for mild pain.    Marland Kitchen  ALPRAZolam (XANAX) 0.25 MG tablet Take 1 tablet (0.25 mg total) by mouth 2 (two) times daily as needed for anxiety. 30 tablet 0  . amLODipine (NORVASC) 10 MG tablet Take 1 tablet (10 mg total) by mouth daily. 30 tablet 1  . calcium carbonate (TUMS - DOSED IN MG ELEMENTAL CALCIUM) 500 MG chewable tablet Chew 1 tablet (200 mg of elemental calcium total) by mouth 3 (three) times daily with meals. 100 tablet 1  . carvedilol (COREG) 12.5 MG tablet Take 1 tablet (12.5 mg total) by mouth 2 (two) times daily with a meal. 60 tablet 1  . escitalopram (LEXAPRO) 20 MG tablet Take 1 tablet (20 mg total) by mouth at bedtime. 30 tablet 1  . furosemide (LASIX)  40 MG tablet Take 1 tablet (40 mg total) by mouth daily. 30 tablet 1  . gabapentin (NEURONTIN) 300 MG capsule Take 1 capsule (300 mg total) by mouth 3 (three) times daily. 90 capsule 1  . insulin NPH-regular Human (NOVOLIN 70/30) (70-30) 100 UNIT/ML injection Inject 10 Units into the skin 2 (two) times daily with a meal. 10 mL 11  . levofloxacin (LEVAQUIN) 250 MG tablet Take 1 tablet (250 mg total) by mouth daily. 2 tablet 0  . lovastatin (MEVACOR) 20 MG tablet Take 1 tablet (20 mg total) by mouth daily. 30 tablet 11  . methocarbamol (ROBAXIN) 500 MG tablet Take 1 tablet (500 mg total) by mouth every 6 (six) hours as needed for muscle spasms. 90 tablet 1  . Multiple Vitamin (MULTIVITAMIN WITH MINERALS) TABS tablet Take 1 tablet by mouth daily. 30 tablet 1  . nortriptyline (PAMELOR) 10 MG capsule Take 1 capsule (10 mg total) by mouth at bedtime. 30 capsule 1  . oxyCODONE (ROXICODONE) 15 MG immediate release tablet Take 1 tablet (15 mg total) by mouth every 6 (six) hours as needed for severe pain. 120 tablet 0  . pantoprazole (PROTONIX) 40 MG tablet Take 1 tablet (40 mg total) by mouth 2 (two) times daily. 60 tablet 1  . ranitidine (ZANTAC) 300 MG tablet Take 1 tablet (300 mg total) by mouth at bedtime. 30 tablet 3  . senna-docusate (SENOKOT-S) 8.6-50 MG per tablet Take 2 tablets by mouth 2 (two) times daily. 120 tablet 1  . tamsulosin (FLOMAX) 0.4 MG CAPS capsule Take 1 capsule (0.4 mg total) by mouth daily after supper. 30 capsule 1  . warfarin (COUMADIN) 5 MG tablet Take 1.5 tablets (7.5 mg) every day with supper starting tomorrow. 50 tablet 1    Allergies: Allergies as of 01/29/2014  . (No Known Allergies)   Past Medical History  Diagnosis Date  . DVT (deep venous thrombosis) 09/2002    patient reports additional DVTs in '06 & '11 (unconfirmed)  . Pulmonary embolism 09/2002    treated with 6 months of warfarin  . Hypertension   . Chest pain, neg MI, normal coronaries by cath 02/18/2013  .  Hyperlipidemia 02/19/2013  . Acute venous embolism and thrombosis of deep vessels of proximal lower extremity 07/19/2011  . Type I diabetes mellitus dx'd 2001  . GERD (gastroesophageal reflux disease)   . Diabetic ulcer of right foot   . CKD (chronic kidney disease) stage 3, GFR 30-59 ml/min 02/19/2013  . Nephrotic syndrome    Past Surgical History  Procedure Laterality Date  . Incision and drainage abscess  2007; 2015    "back"  . Cardiac catheterization  02/18/2013    normal coronaries  . I&d extremity Right 12/14/2013  Procedure: IRRIGATION AND DEBRIDEMENT RIGHT FOOT;  Surgeon: [name], MD;  Location: [place];  Service: Orthopedics;  Laterality: Right;  . Left heart catheterization with coronary angiogram N/A 02/18/2013    Procedure: LEFT HEART CATHETERIZATION WITH CORONARY ANGIOGRAM;  Surgeon: [name], MD;  Location: [facility] CATH LAB;  Service: Cardiovascular;  Laterality: N/A;  . Amputation Right 12/22/2013    Procedure: AMPUTATION BELOW KNEE;  Surgeon: [name], MD;  Location: [place];  Service: Orthopedics;  Laterality: Right;  . Incision and drainage of wound Right 12/22/2013    Procedure: I&D RIGHT BUTTOCK;  Surgeon: [name], MD;  Location: [place];  Service: Orthopedics;  Laterality: Right;   Family History  Problem Relation Age of Onset  . Heart disease    . Diabetes    . Obesity     History   Social History  . Marital Status: Single    Spouse Name: N/A    Number of Children: N/A  . Years of Education: 15.5   Occupational History  . [employer]   . student   .     Social History Main Topics  . Smoking status: Never Smoker   . Smokeless tobacco: Never Used  . Alcohol Use: Yes     Comment: 12/09/2013 "a few beers q 6 months or so"  . Drug Use: No  . Sexual Activity: No   Other Topics Concern  . Not on file   Social History Narrative   Financial assistance approved for 100% discount at [facility] and has [facility] card per [employer]   09/01/2009.      Lives  in [place] with mother and sister.                Review of Systems: A comprehensive review of systems was negative except for: as noted above per HPI  Physical Exam: Blood pressure 135/71, pulse 99, temperature 98.5 F (36.9 C), temperature source Oral, resp. rate 18, height 6\' 2"  (1.88 m), weight 332 lb (150.594 kg), SpO2 98 %.  Gen: A&O x 4, no acute distress, obese HEENT: Atraumatic, PERRL, EOMI, sclerae anicteric, moist mucous membranes Heart: Regular rate and rhythm, normal S1 S2, no murmurs, rubs, or gallops Lungs: Clear to auscultation bilaterally, respirations unlabored Abd: Soft, diffusely tender, non-distended, + bowel sounds, no hepatosplenomegaly GU: R buttock with small bandage appears clean with no surrounding erythema, normal sphincter tone with no obvious hemorrhoids or gross blood Ext: Leg LE no edema, RLE w BKA. Prominent black eschar with granulation tissue borders and minimal sanguinous drainage, staples in place, tenderness on palpation of medial aspect, limb otherwise appears dry        Lab results: Basic Metabolic Panel:  Recent Labs  01/29/14 2313  NA 135  K 4.6  CL 104  CO2 23  GLUCOSE 224*  BUN 22  CREATININE 1.72*  CALCIUM 8.8   Liver Function Tests:  Recent Labs  01/29/14 2313  AST 17  ALT 23  ALKPHOS 177*  BILITOT 0.5  PROT 7.2  ALBUMIN 2.0*    Recent Labs  01/29/14 2313  LIPASE 39   CBC:  Recent Labs  01/29/14 2313  WBC 12.9*  NEUTROABS 9.2*  HGB 7.7*  HCT 24.0*  MCV 82.5  PLT 701*   CBG:  Recent Labs  01/29/14 2322  GLUCAP 229*   Coagulation:  Recent Labs  01/29/14 2313  LABPROT 59.8*  INR 6.84*   Urinalysis:  Recent Labs  01/30/14 0114  COLORURINE YELLOW  LABSPEC 1.013  PHURINE 7.0  GLUCOSEU 250*  HGBUR MODERATE*  BILIRUBINUR NEGATIVE  KETONESUR NEGATIVE  PROTEINUR >300*  UROBILINOGEN 0.2  NITRITE NEGATIVE  LEUKOCYTESUR NEGATIVE   Fecal occult blood negative  Imaging results:   No results found.  Other results: EKG: none in ED  Assessment & Plan by Problem: Principal Problem:   Normocytic anemia Active Problems:   DM (diabetes mellitus) type I uncontrolled, periph vascular disorder   Long term current use of anticoagulant therapy   Essential hypertension   Hyperlipidemia   CKD stage 3 due to type 1 diabetes mellitus   Amputation of right lower extremity below knee   Supratherapeutic INR  #s/p R BKA: See HPI. Mr Craver notes soreness, swelling, and drainage. On exam, he has an eschar suggesting dehiscence but otherwise it appears well healed with minimal sanguinous drainage. However, this may be the source of his hemoglobin drop as he was FOB negative. While he has a mild leukocytosis of 12.9, he has no other signs of infection and is afebrile with normal lactic acid. He is scheduled for follow-up with Dr. Alger Simons of pain rehab medicine on 02/20/14. It does not appear he is scheduled for orthopedic follow-up at this time. He is taking oxycodone 15 mg po q6hprn for pain. -call ortho in am -plain film R knee -ensure ortho f/u on discharge -oxycodone 15 mg po q6hprn  #h/o DVT: Per EMR, he had a DVT with PE in 2004 and a repeat DVT in 2011 and is now on chronic warfarin. Presenting INR 6.84 on home regimen of warfarin 7.5 mg qhs. He has had issues in the past missing appointments with Dr Elie Confer with concerns about unmonitored warfarin use. He said he has not gone to have his INR checked because he has an appointment with [facility] clinic on 02/05/13 -hold warfarin in setting of supratherapeutic anticoagulation -vitamin k 5 mg po once -repeat PT/INR in am -cardiac monitoring  #Anemia: Presenting hemoglobin 7.7 down from 9.4 twelve days ago. He has minimal sanguinous drainage from his stump which may be responsible. While he noted dark stools, he is FOB negative. His supratherapeutic INR and UA noted moderate hemoglobin on dipstick but says his urine looks normal. Per  CIR discharge summary, he required 2 U PRBC transfusion. ED has ordered 2 U PRBC transfusion -2 U PRBC transfusion -post-transfusion CBC  #Abdominal pain with nausea: His abdominal pain with nausea is a chronic issue that is likely gastroparesis. Lipase normal, LFTs normal other than alkaline phosphatase 177 which is actually decreased from 204 on 12/29/13. He had a CT abd pelvis for similar symptoms which found no acute findings but possible sludge versus stones in the gallbladder fundus. His symptoms resolved with zofran and morphine in the ED. -consider outpatient gastric emptying study -zofran 4 mg odt q8hprn -repeat CMP in am  #DM1: While he has DM1, he likely has a component of insulin resistance. Presenting CBG of 229 with 250 glucose in UA. He has no anion gap (8) or acidosis on CMP. Last hemoglobin A1c was 12.8 on 08/14/13.  As noted above, CIR changed his insulin regimen to 70/30 10 u bid. He is also on gabapentin 300 mg tid.  -SSI resistant with hs coverage -hold 70/30 for now -cont gabapentin 300 mg tid -check hemoglobin A1c -CBG q4h  #HTN: BP has been 130s-160s/60s-100s. At home he is on amlodipine 10 mg daily, carvedilol 12.5 mg bid, lasix 40 mg daily, and tamsulosin 0.4 mg daily. -cont amlodipine 10  mg daily, carvedilol 12.5 mg bid, lasix 40 mg daily, and tamsulosin 0.4 mg daily  #CKD stage 3: Mr Husmann's presenting creatinine is 1.72 with GFR 57. This is improved from baseline creatinine ~ 2.5 on previous admission. Kidney disease likely secondary to diabetes and HTN. UA had >300 protein -repeat CMP in am  #HL: Last lipid profile 02/19/13 with cholesterol 258, triglyceride 329, HDL 45, LDL 147. He is on lovastatin 20 mg daily. -pravastatin 20 mg po daily  #Mood disorder: Mr Evetts was started on lexapro 20 mg qhs and Xanax 0.25 mg bidprn while in CIR for mood stabilization. He is also on nortripyline 10 mg po qhs. -cont lexapro 20 mg qhs, nortriptyline 10 mg qhs -hold xanax  0.25 mg bidprn (last used reportedly 01/20/14)  #GERD: He is on protonix 40 mg po bid and ranitidine 300 mg qhs at home. Ranitidine can increase INR -hold ranitidine 300 mg qhs -cont protonix 40 mg bid  #Diet: NPO until ortho evaluation  #DVT PPx: SCDs  #Code: full  Dispo: Disposition is deferred at this time, awaiting improvement of current medical problems. Anticipated discharge in approximately 1-2 day(s).   The patient does have a current PCP Lucious Groves, DO) and does need an [facility] hospital follow-up appointment after discharge.  The patient does not know have transportation limitations that hinder transportation to clinic appointments.  Signed: Kelby Aline, MD 01/30/2014, 4:23 AM    ADDENDUM: #Transient hypoxia: Mr Klopfenstein had some SOB but says it was resolved. He denied any cough. His lungs were clear on exam. However, he desated to 83% on room air before returning to normal oxygen saturation with nasal cannula. This may be due to his body habitus while lying on his side. He was recently treated with levaquin for bronchitis as described above. PE unlikely given supratherapeutic INR -CXR -O2 via [place] and continue to monitor  Lottie Mussel, MD 01/30/14 4:32 am

## 2014-01-30 NOTE — Progress Notes (Signed)
OT Cancellation Note  Patient Details Name: [name] MRN: [id number] DOB: Dec 23, 1975   Cancelled Treatment:    Reason Eval/Treat Not Completed: Medical issues which prohibited therapy. Pt's INR is 6.84 and increased fall risk, will check back at later date for eval when INR down.  Almon Register [id number] 01/30/2014, 2:07 PM

## 2014-01-30 NOTE — Progress Notes (Signed)
UR completed 

## 2014-01-30 NOTE — Progress Notes (Signed)
Admitted pt to rm 2W35 from ED, pt alert and oriented, oriented to room, call bell placed within reach, pt currently receiving blood infusing well to his left AC, blood started in ED. Will monitor   01/30/14 0606  Vitals  Temp 98.3 F (36.8 C)  Temp Source Oral  Pulse Rate 100  Resp 19  BP (!) 161/90 mmHg  Oxygen Therapy  SpO2 98 %  O2 Device Nasal Cannula  O2 Flow Rate (L/min) 2 L/min

## 2014-01-30 NOTE — ED Notes (Signed)
Lab called with PT 59.8, INR 6.84  Dr Sharol Given notified

## 2014-01-30 NOTE — Consult Note (Signed)
Reason for Consult:fall on R BKA stump Referring Physician: EDP  HPI: [name] is an 39 y.o. male with multiple profound medical comorbidities, s/p R BKA 12/14 by Dr. Doran Durand, recent d/c from [place], reports falls x2 yesterday on R BKA stump  Past Medical History  Diagnosis Date  . DVT (deep venous thrombosis) 09/2002    patient reports additional DVTs in '06 & '11 (unconfirmed)  . Pulmonary embolism 09/2002    treated with 6 months of warfarin  . Hypertension   . Chest pain, neg MI, normal coronaries by cath 02/18/2013  . Hyperlipidemia 02/19/2013  . Acute venous embolism and thrombosis of deep vessels of proximal lower extremity 07/19/2011  . Type I diabetes mellitus dx'd 2001  . GERD (gastroesophageal reflux disease)   . Diabetic ulcer of right foot   . CKD (chronic kidney disease) stage 3, GFR 30-59 ml/min 02/19/2013  . Nephrotic syndrome   . Obesity     BMI 44, weight 346 pounds 01/30/14    Past Surgical History  Procedure Laterality Date  . Incision and drainage abscess  2007; 2015    "back"  . Cardiac catheterization  02/18/2013    normal coronaries  . I&d extremity Right 12/14/2013    Procedure: IRRIGATION AND DEBRIDEMENT RIGHT FOOT;  Surgeon: [name], MD;  Location: [facility];  Service: Orthopedics;  Laterality: Right;  . Left heart catheterization with coronary angiogram N/A 02/18/2013    Procedure: LEFT HEART CATHETERIZATION WITH CORONARY ANGIOGRAM;  Surgeon: [name], MD;  Location: [facility] CATH LAB;  Service: Cardiovascular;  Laterality: N/A;  . Amputation Right 12/22/2013    Procedure: AMPUTATION BELOW KNEE;  Surgeon: [name], MD;  Location: [facility];  Service: Orthopedics;  Laterality: Right;  . Incision and drainage of wound Right 12/22/2013    Procedure: I&D RIGHT BUTTOCK;  Surgeon: [name], MD;  Location: [facility];  Service: Orthopedics;  Laterality: Right;    Family History  Problem Relation Age of Onset  . Heart disease    . Diabetes    . Obesity       Social History:  reports that he has never smoked. He has never used smokeless tobacco. He reports that he does not drink alcohol or use illicit drugs.  Allergies: No Known Allergies  Medications: I have reviewed the patient's current medications.  Results for orders placed or performed during the hospital encounter of 01/29/14 (from the past 48 hour(s))  CBC with Differential     Status: Abnormal   Collection Time: 01/29/14 11:13 PM  Result Value Ref Range   WBC 12.9 (H) 4.0 - 10.5 K/uL   RBC 2.91 (L) 4.22 - 5.81 MIL/uL   Hemoglobin 7.7 (L) 13.0 - 17.0 g/dL   HCT 24.0 (L) 39.0 - 52.0 %   MCV 82.5 78.0 - 100.0 fL   MCH 26.5 26.0 - 34.0 pg   MCHC 32.1 30.0 - 36.0 g/dL   RDW 14.8 11.5 - 15.5 %   Platelets 701 (H) 150 - 400 K/uL   Neutrophils Relative % 71 43 - 77 %   Neutro Abs 9.2 (H) 1.7 - 7.7 K/uL   Lymphocytes Relative 20 12 - 46 %   Lymphs Abs 2.6 0.7 - 4.0 K/uL   Monocytes Relative 8 3 - 12 %   Monocytes Absolute 1.1 (H) 0.1 - 1.0 K/uL   Eosinophils Relative 1 0 - 5 %   Eosinophils Absolute 0.1 0.0 - 0.7 K/uL   Basophils Relative 0 0 -  1 %   Basophils Absolute 0.0 0.0 - 0.1 K/uL  Comprehensive metabolic panel     Status: Abnormal   Collection Time: 01/29/14 11:13 PM  Result Value Ref Range   Sodium 135 135 - 145 mmol/L    Comment: Please note change in reference range.   Potassium 4.6 3.5 - 5.1 mmol/L    Comment: Please note change in reference range.   Chloride 104 96 - 112 mEq/L   CO2 23 19 - 32 mmol/L   Glucose, Bld 224 (H) 70 - 99 mg/dL   BUN 22 6 - 23 mg/dL   Creatinine, Ser 1.72 (H) 0.50 - 1.35 mg/dL   Calcium 8.8 8.4 - 10.5 mg/dL   Total Protein 7.2 6.0 - 8.3 g/dL   Albumin 2.0 (L) 3.5 - 5.2 g/dL   AST 17 0 - 37 U/L   ALT 23 0 - 53 U/L   Alkaline Phosphatase 177 (H) 39 - 117 U/L   Total Bilirubin 0.5 0.3 - 1.2 mg/dL   GFR calc non Af Amer 49 (L) >90 mL/min   GFR calc Af Amer 57 (L) >90 mL/min    Comment: (NOTE) The eGFR has been calculated using the  CKD EPI equation. This calculation has not been validated in all clinical situations. eGFR's persistently <90 mL/min signify possible Chronic Kidney Disease.    Anion gap 8 5 - 15  Lipase, blood     Status: None   Collection Time: 01/29/14 11:13 PM  Result Value Ref Range   Lipase 39 11 - 59 U/L  Protime-INR     Status: Abnormal   Collection Time: 01/29/14 11:13 PM  Result Value Ref Range   Prothrombin Time 59.8 (H) 11.6 - 15.2 seconds   INR 6.84 (HH) 0.00 - 1.49    Comment: REPEATED TO VERIFY CRITICAL RESULT CALLED TO, READ BACK BY AND VERIFIED WITH:  Jeneen Montgomery RN @ [id number] 01.22.16 [place]   POC CBG, ED     Status: Abnormal   Collection Time: 01/29/14 11:22 PM  Result Value Ref Range   Glucose-Capillary 229 (H) 70 - 99 mg/dL  I-Stat CG4 Lactic Acid, ED     Status: None   Collection Time: 01/29/14 11:24 PM  Result Value Ref Range   Lactic Acid, Venous 1.31 0.5 - 2.0 mmol/L  Urinalysis, Routine w reflex microscopic     Status: Abnormal   Collection Time: 01/30/14  1:14 AM  Result Value Ref Range   Color, Urine YELLOW YELLOW   APPearance CLEAR CLEAR   Specific Gravity, Urine 1.013 1.005 - 1.030   pH 7.0 5.0 - 8.0   Glucose, UA 250 (A) NEGATIVE mg/dL   Hgb urine dipstick MODERATE (A) NEGATIVE   Bilirubin Urine NEGATIVE NEGATIVE   Ketones, ur NEGATIVE NEGATIVE mg/dL   Protein, ur >300 (A) NEGATIVE mg/dL   Urobilinogen, UA 0.2 0.0 - 1.0 mg/dL   Nitrite NEGATIVE NEGATIVE   Leukocytes, UA NEGATIVE NEGATIVE  Urine microscopic-add on     Status: Abnormal   Collection Time: 01/30/14  1:14 AM  Result Value Ref Range   Squamous Epithelial / LPF RARE RARE   RBC / HPF 11-20 <3 RBC/hpf   Bacteria, UA RARE RARE   Casts WAXY CAST (A) NEGATIVE  Prepare RBC     Status: None   Collection Time: 01/30/14  3:00 AM  Result Value Ref Range   Order Confirmation ORDER PROCESSED BY BLOOD BANK   POC occult blood, ED     Status:  None   Collection Time: 01/30/14  3:07 AM  Result Value Ref  Range   Fecal Occult Bld NEGATIVE NEGATIVE  Type and screen     Status: None (Preliminary result)   Collection Time: 01/30/14  3:47 AM  Result Value Ref Range   ABO/RH(D) B POS    Antibody Screen NEG    Sample Expiration 02/02/2014    Unit Number [id number]    Blood Component Type RBC LR PHER1    Unit division 00    Status of Unit ISSUED    Transfusion Status OK TO TRANSFUSE    Crossmatch Result Compatible    Unit Number [id number]    Blood Component Type RED CELLS,LR    Unit division 00    Status of Unit ISSUED    Transfusion Status OK TO TRANSFUSE    Crossmatch Result Compatible   MRSA PCR Screening     Status: None   Collection Time: 01/30/14  6:46 AM  Result Value Ref Range   MRSA by PCR NEGATIVE NEGATIVE    Comment:        The GeneXpert MRSA Assay (FDA approved for NASAL specimens only), is one component of a comprehensive MRSA colonization surveillance program. It is not intended to diagnose MRSA infection nor to guide or monitor treatment for MRSA infections.   Glucose, capillary     Status: Abnormal   Collection Time: 01/30/14  8:58 AM  Result Value Ref Range   Glucose-Capillary 191 (H) 70 - 99 mg/dL   Comment 1 Notify RN    Comment 2 Documented in Chart   Glucose, capillary     Status: Abnormal   Collection Time: 01/30/14 11:29 AM  Result Value Ref Range   Glucose-Capillary 190 (H) 70 - 99 mg/dL   Comment 1 Notify RN    Comment 2 Documented in Chart     Dg Chest 2 View  01/30/2014   CLINICAL DATA:  Oxygen desaturation  EXAM: CHEST  2 VIEW  COMPARISON:  01/20/2014  FINDINGS: Marked hypo aeration, with interstitial crowding. No edema or pneumonia is suspected. Heart size is normal. Negative aortic and hilar contours.  IMPRESSION: Marked hypoventilation.   Electronically Signed   By: Jorje Guild M.D.   On: 01/30/2014 05:13   Dg Knee 1-2 Views Right  01/30/2014   CLINICAL DATA:  Status post recent RIGHT below-knee amputation, fall, assess for  fracture or osteomyelitis as there is wound tenderness and drainage.  EXAM: RIGHT KNEE - 1-2 VIEW  COMPARISON:  None.  FINDINGS: Status post below-knee amputation, No disc directed bony lesions. No fracture deformity. No dislocation. No subcutaneous gas or radiopaque foreign bodies. Skin staples at the stump.  IMPRESSION: No acute fracture deformity nor dislocation. Status post below-knee amputation without radiographic findings of osteomyelitis.   Electronically Signed   By: Elon Alas   On: 01/30/2014 05:13     Vitals Temp:  [98.2 F (36.8 C)-98.9 F (37.2 C)] 98.4 F (36.9 C) (01/22 1000) Pulse Rate:  [98-111] 100 (01/22 1237) Resp:  [18-22] 20 (01/22 1237) BP: (105-165)/(60-100) 142/77 mmHg (01/22 1237) SpO2:  [83 %-100 %] 95 % (01/22 1237) Weight:  [150.594 kg (332 lb)-157 kg (346 lb 2 oz)] 157 kg (346 lb 2 oz) (01/22 0606) Body mass index is 44.42 kg/(m^2).  Physical Exam: Obese BM, A and O, cooperative, requesting diet. R BKA stump with staple line intact, scant serosanguinous drainage from mid point of staple line, no dehiscence, broad eschar over later half of  distal skin flap, no fluctuance, purulence or foul smell.     Assessment/Plan: Impression: s/p R BKA with recent fall onto stump causing some bloody drainage from the wound but no obvious dehiscence. The eschar appears stable, and I do not see any changes to suggest the need for any type of urgent surgical intervention. Treatment: Dry dressing to R BKA stump, change QD and PRN. From ortho perspective ok to resume diet. Will follow  Stephene Alegria M 01/30/2014, 12:56 PM

## 2014-01-30 NOTE — ED Notes (Signed)
Lab called to inquire about pt's blood work not being in process.

## 2014-01-30 NOTE — Progress Notes (Signed)
Subjective: Pt complaining of nausea this morning that started 2 days ago.  Objective: Vital signs in last 24 hours: Filed Vitals:   01/30/14 0530 01/30/14 0542 01/30/14 0606 01/30/14 0745  BP: 105/74 149/73 161/90 149/76  Pulse:  99 100 104  Temp:  98.9 F (37.2 C) 98.3 F (36.8 C) 98.7 F (37.1 C)  TempSrc:  Oral Oral Oral  Resp:  20 19 20   Height:   6\' 2"  (1.88 m)   Weight:   346 lb 2 oz (157 kg)   SpO2:  96% 98% 95%   Weight change:   Intake/Output Summary (Last 24 hours) at 01/30/14 0840 Last data filed at 01/30/14 0745  Gross per 24 hour  Intake    305 ml  Output    600 ml  Net   -295 ml   Vitals reviewed General: NAD, laying in bed comfortably Lungs: CTAB, no wheezing Cardiac: RRR, no murmurs GI: soft, active bowel sounds Ext: rt BKA draining and tender to palpation Neuro: CN II-XII grossly intact  Lab Results: Basic Metabolic Panel:  Recent Labs Lab 01/29/14 2313  NA 135  K 4.6  CL 104  CO2 23  GLUCOSE 224*  BUN 22  CREATININE 1.72*  CALCIUM 8.8   Liver Function Tests:  Recent Labs Lab 01/29/14 2313  AST 17  ALT 23  ALKPHOS 177*  BILITOT 0.5  PROT 7.2  ALBUMIN 2.0*    Recent Labs Lab 01/29/14 2313  LIPASE 39   CBC:  Recent Labs Lab 01/29/14 2313  WBC 12.9*  NEUTROABS 9.2*  HGB 7.7*  HCT 24.0*  MCV 82.5  PLT 701*   CBG:  Recent Labs Lab 01/29/14 2322  GLUCAP 229*   Coagulation:  Recent Labs Lab 01/29/14 2313  LABPROT 59.8*  INR 6.84*   Urinalysis:  Recent Labs Lab 01/30/14 0114  COLORURINE YELLOW  LABSPEC 1.013  PHURINE 7.0  GLUCOSEU 250*  HGBUR MODERATE*  BILIRUBINUR NEGATIVE  KETONESUR NEGATIVE  PROTEINUR >300*  UROBILINOGEN 0.2  NITRITE NEGATIVE  LEUKOCYTESUR NEGATIVE   Studies/Results: Dg Chest 2 View  01/30/2014   CLINICAL DATA:  Oxygen desaturation  EXAM: CHEST  2 VIEW  COMPARISON:  01/20/2014  FINDINGS: Marked hypo aeration, with interstitial crowding. No edema or pneumonia is  suspected. Heart size is normal. Negative aortic and hilar contours.  IMPRESSION: Marked hypoventilation.   Electronically Signed   By: Jorje Guild M.D.   On: 01/30/2014 05:13   Dg Knee 1-2 Views Right  01/30/2014   CLINICAL DATA:  Status post recent RIGHT below-knee amputation, fall, assess for fracture or osteomyelitis as there is wound tenderness and drainage.  EXAM: RIGHT KNEE - 1-2 VIEW  COMPARISON:  None.  FINDINGS: Status post below-knee amputation, No disc directed bony lesions. No fracture deformity. No dislocation. No subcutaneous gas or radiopaque foreign bodies. Skin staples at the stump.  IMPRESSION: No acute fracture deformity nor dislocation. Status post below-knee amputation without radiographic findings of osteomyelitis.   Electronically Signed   By: Elon Alas   On: 01/30/2014 05:13   Medications: I have reviewed the patient's current medications. Scheduled Meds: . sodium chloride  10 mL/hr Intravenous Once  . amLODipine  10 mg Oral Daily  . calcium carbonate  1 tablet Oral TID WC  . carvedilol  12.5 mg Oral BID WC  . escitalopram  20 mg Oral QHS  . furosemide  40 mg Oral Daily  . gabapentin  300 mg Oral TID  . insulin aspart  0-15 Units Subcutaneous 6 times per day  . insulin aspart  0-5 Units Subcutaneous QHS  . multivitamin with minerals  1 tablet Oral Daily  . nortriptyline  10 mg Oral QHS  . pantoprazole  40 mg Oral BID  . pravastatin  20 mg Oral q1800  . senna-docusate  2 tablet Oral BID  . sodium chloride  3 mL Intravenous Q12H  . tamsulosin  0.4 mg Oral QPC supper   Continuous Infusions: . sodium chloride     PRN Meds:.ondansetron, oxyCODONE Assessment/Plan: Principal Problem:   Normocytic anemia Active Problems:   DM (diabetes mellitus) type I uncontrolled, periph vascular disorder   Long term current use of anticoagulant therapy   Essential hypertension   Hyperlipidemia   CKD stage 3 due to type 1 diabetes mellitus   Amputation of right  lower extremity below knee   Supratherapeutic INR   #s/p R BKA:  eschar suggesting dehiscence but otherwise it appears well healed with minimal sanguinous drainage. However, this may be the source of his hemoglobin drop as he was FOB negative. While he has a mild leukocytosis of 12.9, he has no other signs of infection and is afebrile with normal lactic acid. He is scheduled for follow-up with Dr. Alger Simons on 02/20/14. It does not appear he is scheduled for orthopedic follow-up at this time. He is taking oxycodone 15 mg po q6hprn for pain. -consulted ortho -plain film R knee neg for osteo or fractures -ensure ortho f/u on discharge -oxycodone 15 mg po q6hprn  #h/o DVT: Per EMR, he had a DVT with PE in 2004 and a repeat DVT in 2011 and is now on chronic warfarin. Presenting INR 6.84 on home regimen of warfarin 7.5 mg qhs. He has had issues in the past missing appointments with Dr Elie Confer with concerns about unmonitored warfarin use. Vit K 5mg  given once on admission -hold warfarin in setting of supratherapeutic anticoagulation -repeat PT/INR pending  #Anemia: Presenting hemoglobin 7.7 down from 9.4 twelve days ago. FOB negative. His supratherapeutic INR and UA noted moderate hemoglobin on dipstick but says his urine looks normal. Per CIR discharge summary, he required 2 U PRBC transfusion. ED has ordered 2 U PRBC transfusion. Given 2 U PRBC on admission -post-transfusion CBC pending - consulted GI  #Abdominal pain with nausea: His abdominal pain with nausea is a chronic issue that is likely gastroparesis.  -consider outpatient gastric emptying study -zofran 4 mg q8hprn -repeat CMP pending - GI consulted  #DM1: While he has DM1, he likely has a component of insulin resistance. Presenting CBG of 229 with 250 glucose in UA. He has no anion gap (8) or acidosis on CMP. Last hemoglobin A1c was 12.8 on 08/14/13. As noted above, CIR changed his insulin regimen to 70/30 10 u bid. He is also on  gabapentin 300 mg tid.  -SSI moderate -hold 70/30 for now -cont gabapentin 300 mg tid -check hemoglobin A1c -CBG q4h  #HTN: -cont home amlodipine 10 mg daily, carvedilol 12.5 mg bid, and lasix 40 mg daily  #CKD stage 3: Mr Higham's presenting creatinine is 1.72 with GFR 57. This is improved from baseline creatinine ~ 2.5 on previous admission. Kidney disease likely secondary to diabetes and HTN. UA had >300 protein -will continue to monitor  #HLD: Last lipid profile 02/19/13 with cholesterol 258, triglyceride 329, HDL 45, LDL 147. He is on lovastatin 20 mg daily. -pravastatin 20 mg po daily  #Mood disorder: Mr Taras was started on lexapro 20 mg qhs  and Xanax 0.25 mg bidprn while in CIR for mood stabilization. He is also on nortripyline 10 mg po qhs. -cont lexapro 20 mg qhs, nortriptyline 10 mg qhs -hold xanax 0.25 mg bidprn (last used reportedly 01/20/14)  #GERD: He is on protonix 40 mg po bid and ranitidine 300 mg qhs at home. Ranitidine can increase INR -hold ranitidine 300 mg qhs -cont protonix 40 mg bid  #Diet: NPO until ortho evaluation  #DVT PPx: SCDs  #Code: full  Dispo: Disposition is deferred at this time, awaiting improvement of current medical problems. Anticipated discharge in approximately 1-2 day(s).   The patient does have a current PCP Lucious Groves, DO) and does need an [facility] hospital follow-up appointment after discharge.  The patient does not know have transportation limitations that hinder transportation to clinic appointments.   .Services Needed at time of discharge: Y = Yes, Blank = No PT:   OT:   RN:   Equipment:   Other:     LOS: 1 day   Julious Oka, MD 01/30/2014, 8:40 AM

## 2014-01-31 ENCOUNTER — Observation Stay (HOSPITAL_COMMUNITY): Payer: Medicaid Other

## 2014-01-31 LAB — TYPE AND SCREEN
ABO/RH(D): B POS
Antibody Screen: NEGATIVE
UNIT DIVISION: 0
UNIT DIVISION: 0

## 2014-01-31 LAB — CBC
HCT: 24.8 % — ABNORMAL LOW (ref 39.0–52.0)
HCT: 27.1 % — ABNORMAL LOW (ref 39.0–52.0)
HEMOGLOBIN: 8 g/dL — AB (ref 13.0–17.0)
HEMOGLOBIN: 8.8 g/dL — AB (ref 13.0–17.0)
MCH: 26.8 pg (ref 26.0–34.0)
MCH: 27.1 pg (ref 26.0–34.0)
MCHC: 32.3 g/dL (ref 30.0–36.0)
MCHC: 32.5 g/dL (ref 30.0–36.0)
MCV: 83.2 fL (ref 78.0–100.0)
MCV: 83.4 fL (ref 78.0–100.0)
PLATELETS: 619 10*3/uL — AB (ref 150–400)
Platelets: 605 10*3/uL — ABNORMAL HIGH (ref 150–400)
RBC: 2.98 MIL/uL — ABNORMAL LOW (ref 4.22–5.81)
RBC: 3.25 MIL/uL — ABNORMAL LOW (ref 4.22–5.81)
RDW: 14.8 % (ref 11.5–15.5)
RDW: 14.6 % (ref 11.5–15.5)
WBC: 11.1 10*3/uL — AB (ref 4.0–10.5)
WBC: 12.7 10*3/uL — ABNORMAL HIGH (ref 4.0–10.5)

## 2014-01-31 LAB — PROTIME-INR
INR: 2.78 — ABNORMAL HIGH (ref 0.00–1.49)
PROTHROMBIN TIME: 29.5 s — AB (ref 11.6–15.2)

## 2014-01-31 LAB — GLUCOSE, CAPILLARY
GLUCOSE-CAPILLARY: 157 mg/dL — AB (ref 70–99)
GLUCOSE-CAPILLARY: 135 mg/dL — AB (ref 70–99)
Glucose-Capillary: 135 mg/dL — ABNORMAL HIGH (ref 70–99)
Glucose-Capillary: 156 mg/dL — ABNORMAL HIGH (ref 70–99)

## 2014-01-31 MED ORDER — POLYETHYLENE GLYCOL 3350 17 G PO PACK
17.0000 g | PACK | Freq: Two times a day (BID) | ORAL | Status: DC
  Filled 2014-01-31 (×3): qty 1

## 2014-01-31 MED ORDER — SODIUM CHLORIDE 0.9 % IV SOLN: INTRAVENOUS | Status: AC

## 2014-01-31 NOTE — Progress Notes (Signed)
Internal Medicine Attending  Date: 01/31/2014  Patient name: [name] Medical record number: [id number] Date of birth: Aug 06, 1975 Age: 39 y.o. Gender: male  I saw and evaluated the patient. I discussed patient and reviewed the resident's note by Dr. Denton Brick, and I agree with the resident's findings and plans as documented in her note.

## 2014-01-31 NOTE — Progress Notes (Signed)
PT Cancellation Note  Patient Details Name: [name] MRN: [id number] DOB: [birth year]   Cancelled Treatment:    Reason Eval/Treat Not Completed: Other (comment) (Patient with nausea and vomiting) Pt states he is too nauseated to complete PT eval this AM. Will attempt this afternoon as time allows.  [name] 01/31/2014, 8:46 AM [name], [place]

## 2014-01-31 NOTE — Progress Notes (Signed)
Subjective: Pt still having mid epigastric pain. Denies NSAID use. Had not had a bowel movement since the 21st. Says he wants to now.- but couldn't. Also complaints of nausea and foot pain- right stump.  Objective: Vital signs in last 24 hours: Filed Vitals:   01/30/14 1544 01/30/14 2026 01/31/14 0500 01/31/14 0640  BP: 157/86 130/66  160/81  Pulse: 104 100  106  Temp: 98.1 F (36.7 C) 99.4 F (37.4 C)  99.2 F (37.3 C)  TempSrc: Oral Oral  Oral  Resp: 20 20  18   Height:      Weight:   346 lb 2 oz (157 kg) 344 lb 5.7 oz (156.2 kg)  SpO2: 94% 95%  95%   Weight change: 14 lb 2 oz (6.406 kg)  Intake/Output Summary (Last 24 hours) at 01/31/14 1420 Last data filed at 01/31/14 1100  Gross per 24 hour  Intake    360 ml  Output   1650 ml  Net  -1290 ml   Vitals reviewed General: NAD, laying in bed comfortably, awake Lungs: CTAB Cardiac: RRR, no murmurs GI: soft, mild tenderness epigastric area, normoactive bowel sounds Ext: rt BKA with clean dressing applied. Neuro: No obvious deficits.  Lab Results: Basic Metabolic Panel:  Recent Labs Lab 01/29/14 2313 01/30/14 1355  NA 135 137  K 4.6 4.3  CL 104 105  CO2 23 25  GLUCOSE 224* 205*  BUN 22 20  CREATININE 1.72* 1.69*  CALCIUM 8.8 8.6   Liver Function Tests:  Recent Labs Lab 01/29/14 2313 01/30/14 1355  AST 17 15  ALT 23 20  ALKPHOS 177* 162*  BILITOT 0.5 0.5  PROT 7.2 6.9  ALBUMIN 2.0* 1.8*    Recent Labs Lab 01/29/14 2313  LIPASE 39   CBC:  Recent Labs Lab 01/29/14 2313 01/30/14 1355 01/31/14 0400  WBC 12.9* 10.6* 11.1*  NEUTROABS 9.2*  --   --   HGB 7.7* 7.7* 8.0*  HCT 24.0* 23.9* 24.8*  MCV 82.5 83.0 83.2  PLT 701* 590* 605*   CBG:  Recent Labs Lab 01/30/14 1401 01/30/14 1622 01/30/14 2012 01/30/14 2341 01/31/14 0637 01/31/14 1120  GLUCAP 188* 171* 164* 150* 135* 157*   Coagulation:  Recent Labs Lab 01/29/14 2313 01/30/14 1355 01/31/14 0400  LABPROT 59.8* 38.9* 29.5*   INR 6.84* 3.95* 2.78*   Urinalysis:  Recent Labs Lab 01/30/14 0114  COLORURINE YELLOW  LABSPEC 1.013  PHURINE 7.0  GLUCOSEU 250*  HGBUR MODERATE*  BILIRUBINUR NEGATIVE  KETONESUR NEGATIVE  PROTEINUR >300*  UROBILINOGEN 0.2  NITRITE NEGATIVE  LEUKOCYTESUR NEGATIVE   Studies/Results: Dg Chest 2 View  01/30/2014   CLINICAL DATA:  Oxygen desaturation  EXAM: CHEST  2 VIEW  COMPARISON:  01/20/2014  FINDINGS: Marked hypo aeration, with interstitial crowding. No edema or pneumonia is suspected. Heart size is normal. Negative aortic and hilar contours.  IMPRESSION: Marked hypoventilation.   Electronically Signed   By: Jorje Guild M.D.   On: 01/30/2014 05:13   Dg Knee 1-2 Views Right  01/30/2014   CLINICAL DATA:  Status post recent RIGHT below-knee amputation, fall, assess for fracture or osteomyelitis as there is wound tenderness and drainage.  EXAM: RIGHT KNEE - 1-2 VIEW  COMPARISON:  None.  FINDINGS: Status post below-knee amputation, No disc directed bony lesions. No fracture deformity. No dislocation. No subcutaneous gas or radiopaque foreign bodies. Skin staples at the stump.  IMPRESSION: No acute fracture deformity nor dislocation. Status post below-knee amputation without radiographic findings of osteomyelitis.  Electronically Signed   By: Elon Alas   On: 01/30/2014 05:13   Medications: I have reviewed the patient's current medications. Scheduled Meds: . sodium chloride  10 mL/hr Intravenous Once  . amLODipine  10 mg Oral Daily  . calcium carbonate  1 tablet Oral TID WC  . carvedilol  12.5 mg Oral BID WC  . escitalopram  20 mg Oral QHS  . furosemide  40 mg Oral Daily  . gabapentin  300 mg Oral TID  . insulin aspart  0-15 Units Subcutaneous TID WC  . insulin aspart  0-5 Units Subcutaneous QHS  . multivitamin with minerals  1 tablet Oral Daily  . nortriptyline  10 mg Oral QHS  . pantoprazole  40 mg Oral BID  . pravastatin  20 mg Oral q1800  . senna-docusate  2  tablet Oral BID  . sodium chloride  3 mL Intravenous Q12H  . tamsulosin  0.4 mg Oral QPC supper   Continuous Infusions:   PRN Meds:.ondansetron, oxyCODONE Assessment/Plan: Principal Problem:   Normocytic anemia Active Problems:   DM (diabetes mellitus) type I uncontrolled, periph vascular disorder   Long term current use of anticoagulant therapy   Essential hypertension   Hyperlipidemia   CKD stage 3 due to type 1 diabetes mellitus   Amputation of right lower extremity below knee   Supratherapeutic INR  #s/p R BKA:  Presently with clean dressing. Yesterday- eschar suggesting dehiscence but otherwise it appears well healed with minimal sanguinous drainage.  He is taking oxycodone 15 mg po q6hprn for pain. Still having pain in his leg. Was seen today by ortho, no concern for acute bleeding.  -consulted ortho recs appreciated- Clean dressings -plain film R knee neg for osteo or fractures -ensure ortho f/u on discharge -oxycodone 15 mg po q6hprn  #h/o DVT: Per EMR, he had a DVT with PE in 2004 and a repeat DVT in 2011 and is now on chronic warfarin. Presenting INR 6.84 on home regimen of warfarin 7.5 mg qhs. He has had issues in the past missing appointments with Dr Elie Confer with concerns about unmonitored warfarin use. Vit K 5mg  given once on admission -hold warfarin in setting of supratherapeutic anticoagulation -INR today- 2.78 - Pharm consult for warfarin dosing, when HgB normalizes. - INR tomorrow Am.  #Anemia: CBC s/p 2 units of PRBCs- 8.0 on presentation- but still no improvement in HgB. Presenting hemoglobin on admission 7.7 down from 9.4 twelve days ago. FOBT negative. Per CIR discharge summary, he required 2 U PRBC transfusion. Given 2 U PRBC on this admission. - Consulted GI- doubt Gi bleed- as FOBT- neg, recs - HIDA scan, also to consider Hematology refferal. - Renal US- As UA has shown persistent hgb - No bowel movements- On [place]- 2 tabs BID, added miralax 17g BID  today. - Observe bowel movements for dark stools, blood - FOBT stool. - CBC today, goal- >8. - Daily CBC BID  Elevated ALP- With epigastric pain. Concern for biliary obstruction, Last [place]- limited, but gall stones seen. - Complete abdominal US, and pending results HIDA scan tomorrow.  #Abdominal pain with nausea: His abdominal pain with nausea is a chronic issue that is likely gastroparesis. Last US- Abdominal- Gall bladder filled with sludge and small stones. Persistently elevated ALP suggestive of billiary obstruction. GGT- elevated- 12/20/2013- suggestive source of elevated ALP is liver. - Will order HIDA scan for tomorrow, pending results of Complete Abdominal US. - consider outpatient gastric emptying study, if etiology not identified. - zofran  4 mg q8hprn - GI consulted, recs appreciated, have signed off.  #DM1: While he has DM1, he likely has a component of insulin resistance. Presenting CBG of 229 with 250 glucose in UA. He has no anion gap (8) or acidosis on CMP. Last hemoglobin A1c was 6.8 on 01/30/2013. As noted above, CIR changed his insulin regimen to 70/30 10 u bid. He is also on gabapentin 300 mg tid.  -SSI moderate -hold 70/30 for now -cont gabapentin 300 mg tid -check hemoglobin A1c -CBG q4h  #HTN: -cont home amlodipine 10 mg daily, carvedilol 12.5 mg bid, and lasix 40 mg daily  #CKD stage 3: Mr Ropp's presenting creatinine is 1.72 with GFR 57. This is improved from baseline creatinine ~ 2.5 on previous admission. Kidney disease likely secondary to diabetes and HTN. UA had >300 protein -will continue to monitor  #HLD: Last lipid profile 02/19/13 with cholesterol 258, triglyceride 329, HDL 45, LDL 147. He is on lovastatin 20 mg daily. -pravastatin 20 mg po daily  #Mood disorder: Mr Wolverton was started on lexapro 20 mg qhs and Xanax 0.25 mg bidprn while in CIR for mood stabilization. He is also on nortripyline 10 mg po qhs. -cont lexapro 20 mg qhs, nortriptyline 10 mg  qhs -hold xanax 0.25 mg bidprn (last used reportedly 01/20/14)  #GERD: He is on protonix 40 mg po bid and ranitidine 300 mg qhs at home. Ranitidine can increase INR -hold ranitidine 300 mg qhs -cont protonix 40 mg bid  #Diet: Advance as tolerated.  #DVT PPx: SCDs for now.  #Code: full  Dispo: Disposition is deferred at this time, awaiting improvement of current medical problems. A   LOS: 2 days   Bethena Roys, MD 01/31/2014, 2:20 PM

## 2014-01-31 NOTE — Evaluation (Signed)
Physical Therapy Evaluation Patient Details Name: [name] MRN: [id number] DOB: 20-Jun-1975 Today's Date: 01/31/2014   History of Present Illness  Patient is a 39 year old man with history of type 1 diabetes mellitus, recurrent venous thromboembolism (DVT/PE), hypertension, hyperlipidemia, chronic kidney disease, status post right BKA by Dr. Doran Durand on 12/22/2013 due to right foot abscesses and wet gangrene, and other problems as outlined in the medical history, admitted with complaint of nausea/vomiting, intermittent abdominal pain, and fall at home with trauma to his amputation stump  Clinical Impression  Pt admitted with the above complications. Pt currently with functional limitations due to the deficits listed below (see PT Problem List). He requires mod assist to stand but has significant balance loss towards his right side with inability to maintain standing longer than a few seconds due to weakness. Unable to safely transfer to chair due to this unsafe amount of balance loss and weakness, requiring pt to sit immediatly back on edge of bed. He states that he has not been very mobile at all since returning home from [place], as he does not have much support when he stays at his mothers. He is concerned that he will continue to grow progressively weaker and have more falls at home. Recommend SNF as this patient is at very high risk of recurrent falls and lacks adequate support at home to safely care for patient at this time. Pt will benefit from skilled PT to increase their independence and safety with mobility to allow discharge to the venue listed below.       Follow Up Recommendations SNF;Supervision for mobility/OOB If SNF not an option, please maximize home support with HHPT and aide.    Equipment Recommendations  None recommended by PT    Recommendations for Other Services       Precautions / Restrictions Precautions Precautions: Fall Restrictions Weight Bearing Restrictions:  Yes RLE Weight Bearing: Non weight bearing      Mobility  Bed Mobility Overal bed mobility: Modified Independent                Transfers Overall transfer level: Needs assistance Equipment used: Rolling walker (2 wheeled) Transfers: Sit to/from Stand Sit to Stand: Mod assist;From elevated surface         General transfer comment: Mod assist for boost to stand x2 from elevated bed surface. Required prolonged rest break between each attempt due to fatigue. Pt only able to tolerate standing for several seconds before needing to sit. HEAVY LOSS OF BALANCE TOWARDS RIGHT WHEN ATTEMPTING TO GRASP WALKER. States his arms fatigue and he can no long safely grip walker. Did not attempt pivot transfer due to weakness and poor balance. VC for technique throughout task  Ambulation/Gait                Stairs            Wheelchair Mobility    Modified Rankin (Stroke Patients Only)       Balance Overall balance assessment: Needs assistance;History of Falls Sitting-balance support: No upper extremity supported;Feet supported Sitting balance-[name]: Good Sitting balance - Comments: Tolerated sitting edge of bed without significant difficulty majority of therapy session. Did not require back support.   Standing balance support: Bilateral upper extremity supported Standing balance-[name]: Poor                               Pertinent Vitals/Pain Pain Assessment: 0-10 Pain Score: 1  Pain Location: Rt residual limb Pain Descriptors / Indicators: Other (Comment) (phantom) Pain Intervention(s): Monitored during session;Premedicated before session;Repositioned    Home Living Family/patient expects to be discharged to:: Skilled nursing facility Living Arrangements: Parent;Spouse/significant other (girlfriend) Available Help at Discharge: Friend(s) (not available 24/7) Type of Home: Apartment Home Access: Stairs to enter Entrance Stairs-Rails:  None Entrance Stairs-Number of Steps: 2 + 1 to enter mom's house;  6 steps to enter girlfriend's house Home Layout: One level;Two level (2 level at mom's; 1 level at girlfriend's) Home Equipment: Wheelchair - [employer] - 2 wheels;Bedside commode;Shower seat Additional Comments: Has been staying with his girlfriend    Prior Function Level of Independence: Needs assistance (Independent prior to recent amputation.)   Gait / Transfers Assistance Needed: Ambulating short distances with RW. Using w/c primarily.  ADL's / Homemaking Assistance Needed: Needs assist with bath/dress        Hand Dominance   Dominant Hand: Right    Extremity/Trunk Assessment   Upper Extremity Assessment: Defer to OT evaluation           Lower Extremity Assessment: Generalized weakness;RLE deficits/detail RLE Deficits / Details: BKA from previous admission 12/22/13       Communication   Communication: No difficulties  Cognition Arousal/Alertness: Lethargic;Suspect due to medications Behavior During Therapy: [facility] for tasks assessed/performed Overall Cognitive Status: Within Functional Limits for tasks assessed                      General Comments General comments (skin integrity, edema, etc.): Complains of his RLE edema    Exercises General Exercises - Lower Extremity Ankle Circles/Pumps: AROM;Left;10 reps;Seated Long Arc Quad: Strengthening;Both;5 reps;Seated      Assessment/Plan    PT Assessment Patient needs continued PT services  PT Diagnosis Difficulty walking;Generalized weakness;Acute pain   PT Problem List Decreased strength;Decreased range of motion;Decreased activity tolerance;Decreased balance;Decreased mobility;Decreased knowledge of use of DME;Decreased safety awareness;Decreased knowledge of precautions;Obesity;Pain  PT Treatment Interventions DME instruction;Gait training;Functional mobility training;Therapeutic activities;Therapeutic exercise;Balance  training;Neuromuscular re-education;Patient/family education;Wheelchair mobility training;Modalities   PT Goals (Current goals can be found in the Care Plan section) Acute Rehab PT Goals Patient Stated Goal: Go to rehab so I can be independent again PT Goal Formulation: With patient Time For Goal Achievement: 02/14/14 Potential to Achieve Goals: Good    Frequency Min 3X/week   Barriers to discharge Inaccessible home environment;Decreased caregiver support Patient states he does not have enough care when he stays at his mother house. Reports he cannot stay with his girlfriend at all times. His mother's house has a flight of stairs to the bedroom.    Co-evaluation               End of Session Equipment Utilized During Treatment: Gait belt Activity Tolerance: Patient limited by fatigue Patient left: in bed;with call bell/phone within reach;with bed alarm set Nurse Communication: Mobility status;Need for lift equipment;Precautions    Functional Assessment Tool Used: clinical observation Functional Limitation: Mobility: Walking and moving around Mobility: Walking and Moving Around Current Status [phone]): At least 40 percent but less than 60 percent impaired, limited or restricted Mobility: Walking and Moving Around Goal Status [phone]): At least 20 percent but less than 40 percent impaired, limited or restricted    Time: 1242-1313 PT Time Calculation (min) (ACUTE ONLY): 31 min   Charges:   PT Evaluation $Initial PT Evaluation Tier I: 1 Procedure PT Treatments $Therapeutic Activity: 8-22 mins   PT G Codes:   PT G-Codes **  NOT FOR INPATIENT CLASS** Functional Assessment Tool Used: clinical observation Functional Limitation: Mobility: Walking and moving around Mobility: Walking and Moving Around Current Status [phone]): At least 40 percent but less than 60 percent impaired, limited or restricted Mobility: Walking and Moving Around Goal Status [phone]): At least 20 percent but less  than 40 percent impaired, limited or restricted    [name] 01/31/2014, 2:18 PM Camille Bal [place], [place]

## 2014-01-31 NOTE — Progress Notes (Signed)
UR completed 

## 2014-01-31 NOTE — Progress Notes (Signed)
    Subjective:     Patient reports pain as 2 on 0-10 scale.   Denies CP or SOB.  Voiding without difficulty. Positive flatus. Objective: Vital signs in last 24 hours: Temp:  [98.1 F (36.7 C)-99.4 F (37.4 C)] 99.2 F (37.3 C) (01/23 0640) Pulse Rate:  [99-106] 106 (01/23 0640) Resp:  [18-20] 18 (01/23 0640) BP: (130-160)/(66-86) 160/81 mmHg (01/23 0640) SpO2:  [94 %-95 %] 95 % (01/23 0640) Weight:  [156.2 kg (344 lb 5.7 oz)-157 kg (346 lb 2 oz)] 156.2 kg (344 lb 5.7 oz) (01/23 0640)  Intake/Output from previous day: 01/22 0701 - 01/23 0700 In: 950 [P.O.:360; Blood:590] Out: 1200 [Urine:1200] Intake/Output this shift:    Labs:  Recent Labs  01/29/14 2313 01/30/14 1355 01/31/14 0400  HGB 7.7* 7.7* 8.0*    Recent Labs  01/30/14 1355 01/31/14 0400  WBC 10.6* 11.1*  RBC 2.88* 2.98*  HCT 23.9* 24.8*  PLT 590* 605*    Recent Labs  01/29/14 2313 01/30/14 1355  NA 135 137  K 4.6 4.3  CL 104 105  CO2 23 25  BUN 22 20  CREATININE 1.72* 1.69*  GLUCOSE 224* 205*  CALCIUM 8.8 8.6    Recent Labs  01/30/14 1355 01/31/14 0400  INR 3.95* 2.78*    Physical Exam: ABD soft Incision: dressing C/D/I and no drainage  Assessment/Plan:     Up with therapy  No signs of infection Dressing clean and dry - no active bleeding Will continue to monitor  [name] D for Dr. Melina Schools [facility] [phone] 01/31/2014, 8:41 AM

## 2014-02-01 ENCOUNTER — Observation Stay (HOSPITAL_COMMUNITY): Payer: Medicaid Other

## 2014-02-01 DIAGNOSIS — E109 Type 1 diabetes mellitus without complications: Secondary | ICD-10-CM | POA: Diagnosis not present

## 2014-02-01 DIAGNOSIS — R109 Unspecified abdominal pain: Secondary | ICD-10-CM | POA: Diagnosis not present

## 2014-02-01 DIAGNOSIS — R110 Nausea: Secondary | ICD-10-CM | POA: Diagnosis not present

## 2014-02-01 DIAGNOSIS — D649 Anemia, unspecified: Secondary | ICD-10-CM | POA: Diagnosis not present

## 2014-02-01 LAB — CBC
HCT: 26.7 % — ABNORMAL LOW (ref 39.0–52.0)
Hemoglobin: 8.6 g/dL — ABNORMAL LOW (ref 13.0–17.0)
MCH: 26.6 pg (ref 26.0–34.0)
MCHC: 32.2 g/dL (ref 30.0–36.0)
MCV: 82.7 fL (ref 78.0–100.0)
PLATELETS: 590 10*3/uL — AB (ref 150–400)
RBC: 3.23 MIL/uL — ABNORMAL LOW (ref 4.22–5.81)
RDW: 14.6 % (ref 11.5–15.5)
WBC: 11.6 10*3/uL — ABNORMAL HIGH (ref 4.0–10.5)

## 2014-02-01 LAB — PROTIME-INR
INR: 3.18 — ABNORMAL HIGH (ref 0.00–1.49)
PROTHROMBIN TIME: 32.8 s — AB (ref 11.6–15.2)

## 2014-02-01 LAB — GLUCOSE, CAPILLARY
GLUCOSE-CAPILLARY: 116 mg/dL — AB (ref 70–99)
GLUCOSE-CAPILLARY: 154 mg/dL — AB (ref 70–99)
GLUCOSE-CAPILLARY: 167 mg/dL — AB (ref 70–99)
Glucose-Capillary: 171 mg/dL — ABNORMAL HIGH (ref 70–99)

## 2014-02-01 LAB — OCCULT BLOOD X 1 CARD TO LAB, STOOL: Fecal Occult Bld: NEGATIVE

## 2014-02-01 MED ORDER — WARFARIN - PHARMACIST DOSING INPATIENT: Freq: Every day | Status: DC

## 2014-02-01 MED ORDER — LISINOPRIL 10 MG PO TABS
10.0000 mg | ORAL_TABLET | Freq: Every day | ORAL | Status: DC
  Administered 2014-02-01 – 2014-02-04 (×4): 10 mg via ORAL
  Filled 2014-02-01 (×4): qty 1

## 2014-02-01 MED ORDER — BISACODYL 10 MG RE SUPP
10.0000 mg | Freq: Once | RECTAL | Status: AC
  Administered 2014-02-01: 10 mg via RECTAL
  Filled 2014-02-01: qty 1

## 2014-02-01 MED ORDER — CARVEDILOL 25 MG PO TABS
25.0000 mg | ORAL_TABLET | Freq: Two times a day (BID) | ORAL | Status: DC
  Filled 2014-02-01 (×2): qty 1

## 2014-02-01 MED ORDER — TECHNETIUM TC 99M MEBROFENIN IV KIT
5.0000 | PACK | Freq: Once | INTRAVENOUS | Status: AC | PRN
  Administered 2014-02-01: 5 via INTRAVENOUS

## 2014-02-01 MED ORDER — CARVEDILOL 12.5 MG PO TABS
12.5000 mg | ORAL_TABLET | Freq: Two times a day (BID) | ORAL | Status: DC
  Administered 2014-02-01 – 2014-02-02 (×2): 12.5 mg via ORAL
  Filled 2014-02-01 (×4): qty 1

## 2014-02-01 MED ORDER — SENNOSIDES-DOCUSATE SODIUM 8.6-50 MG PO TABS
2.0000 | ORAL_TABLET | Freq: Every day | ORAL | Status: DC
  Filled 2014-02-01 (×3): qty 2

## 2014-02-01 NOTE — Progress Notes (Signed)
   Subjective: Hospital day - 3 Patient reports pain as mild.   Patient seen in rounds with Dr. Wynelle Link. Patient is well, and has had no acute complaints or problems  Objective: Vital signs in last 24 hours: Temp:  [98.3 F (36.8 C)-99.1 F (37.3 C)] 98.8 F (37.1 C) (01/24 0515) Pulse Rate:  [96-105] 105 (01/24 0515) Resp:  [19-20] 19 (01/24 0515) BP: (147-164)/(70-88) 164/88 mmHg (01/24 0515) SpO2:  [92 %-96 %] 96 % (01/24 0515) Weight:  [155.6 kg (343 lb 0.6 oz)] 155.6 kg (343 lb 0.6 oz) (01/24 0515)  Intake/Output from previous day:  Intake/Output Summary (Last 24 hours) at 02/01/14 1021 Last data filed at 02/01/14 [id number]  Gross per 24 hour  Intake      0 ml  Output   1500 ml  Net  -1500 ml    Labs:  Recent Labs  01/29/14 2313 01/30/14 1355 01/31/14 0400 01/31/14 1540 02/01/14 0310  HGB 7.7* 7.7* 8.0* 8.8* 8.6*    Recent Labs  01/31/14 1540 02/01/14 0310  WBC 12.7* 11.6*  RBC 3.25* 3.23*  HCT 27.1* 26.7*  PLT 619* 590*    Recent Labs  01/29/14 2313 01/30/14 1355  NA 135 137  K 4.6 4.3  CL 104 105  CO2 23 25  BUN 22 20  CREATININE 1.72* 1.69*  GLUCOSE 224* 205*  CALCIUM 8.8 8.6    Recent Labs  01/31/14 0400 02/01/14 0310  INR 2.78* 3.18*    EXAM General - Patient is Alert and Appropriate Extremity - stump examined, dressing changed, no signs of infection, no active bleeding, stable eschar   Past Medical History  Diagnosis Date  . DVT (deep venous thrombosis) 09/2002    patient reports additional DVTs in '06 & '11 (unconfirmed)  . Pulmonary embolism 09/2002    treated with 6 months of warfarin  . Hypertension   . Chest pain, neg MI, normal coronaries by cath 02/18/2013  . Hyperlipidemia 02/19/2013  . Acute venous embolism and thrombosis of deep vessels of proximal lower extremity 07/19/2011  . Type I diabetes mellitus dx'd 2001  . GERD (gastroesophageal reflux disease)   . Diabetic ulcer of right foot   . CKD (chronic kidney  disease) stage 3, GFR 30-59 ml/min 02/19/2013  . Nephrotic syndrome   . Obesity     BMI 44, weight 346 pounds 01/30/14    Assessment/Plan: Hospital day - 3 Principal Problem:   Normocytic anemia Active Problems:   DM (diabetes mellitus) type I uncontrolled, periph vascular disorder   Long term current use of anticoagulant therapy   Essential hypertension   Hyperlipidemia   CKD stage 3 due to type 1 diabetes mellitus   Amputation of right lower extremity below knee   Supratherapeutic INR  Estimated body mass index is 44.02 kg/(m^2) as calculated from the following:   Height as of this encounter: 6\' 2"  (1.88 m).   Weight as of this encounter: 155.6 kg (343 lb 0.6 oz).  Patient seen in rounds by Dr. Wynelle Link Dressing changed. No sings of active bleeding or sings of infection at stump Continue to work with therapy Risk of falls  Arlee Muslim, PA-C Orthopaedic Surgery 02/01/2014, 10:21 AM

## 2014-02-01 NOTE — Progress Notes (Addendum)
Subjective: Pt reports that epigastric pain has significantly improved. He says he might be able to eat today. Feels better and pain in stump is better. No dizziness, he is able to get up with physical therapy. Had a bowel movement today, without gross blood.   Objective: Vital signs in last 24 hours: Filed Vitals:   01/31/14 0640 01/31/14 1400 01/31/14 2044 02/01/14 0515  BP: 160/81 147/70 149/71 164/88  Pulse: 106 96 105 105  Temp: 99.2 F (37.3 C) 98.3 F (36.8 C) 99.1 F (37.3 C) 98.8 F (37.1 C)  TempSrc: Oral Oral Oral Oral  Resp: 18 20 19 19   Height:      Weight: 344 lb 5.7 oz (156.2 kg)   343 lb 0.6 oz (155.6 kg)  SpO2: 95% 92% 96% 96%   Weight change: -3 lb 1.4 oz (-1.4 kg)  Intake/Output Summary (Last 24 hours) at 02/01/14 1318 Last data filed at 02/01/14 [id number]  Gross per 24 hour  Intake      0 ml  Output   1050 ml  Net  -1050 ml   Vitals reviewed. General: NAD, laying in bed comfortably, awake, alert, in no distress. Lungs: CTAB Cardiac: RRR, no murmurs GI: soft, obese, no tenderness epigastric area, normoactive bowel sounds Ext: rt BKA with clean dressing applied today, part of stump seen, no drainage, mild tenderness. Neuro: No obvious deficits.  Lab Results: Basic Metabolic Panel:  Recent Labs Lab 01/29/14 2313 01/30/14 1355  NA 135 137  K 4.6 4.3  CL 104 105  CO2 23 25  GLUCOSE 224* 205*  BUN 22 20  CREATININE 1.72* 1.69*  CALCIUM 8.8 8.6   Liver Function Tests:  Recent Labs Lab 01/29/14 2313 01/30/14 1355  AST 17 15  ALT 23 20  ALKPHOS 177* 162*  BILITOT 0.5 0.5  PROT 7.2 6.9  ALBUMIN 2.0* 1.8*    Recent Labs Lab 01/29/14 2313  LIPASE 39   CBC:  Recent Labs Lab 01/29/14 2313  01/31/14 1540 02/01/14 0310  WBC 12.9*  < > 12.7* 11.6*  NEUTROABS 9.2*  --   --   --   HGB 7.7*  < > 8.8* 8.6*  HCT 24.0*  < > 27.1* 26.7*  MCV 82.5  < > 83.4 82.7  PLT 701*  < > 619* 590*  < > = values in this interval not  displayed. CBG:  Recent Labs Lab 01/31/14 0637 01/31/14 1120 01/31/14 1616 01/31/14 2143 02/01/14 0627 02/01/14 1125  GLUCAP 135* 157* 156* 135* 116* 154*   Coagulation:  Recent Labs Lab 01/29/14 2313 01/30/14 1355 01/31/14 0400 02/01/14 0310  LABPROT 59.8* 38.9* 29.5* 32.8*  INR 6.84* 3.95* 2.78* 3.18*   Urinalysis:  Recent Labs Lab 01/30/14 0114  COLORURINE YELLOW  LABSPEC 1.013  PHURINE 7.0  GLUCOSEU 250*  HGBUR MODERATE*  BILIRUBINUR NEGATIVE  KETONESUR NEGATIVE  PROTEINUR >300*  UROBILINOGEN 0.2  NITRITE NEGATIVE  LEUKOCYTESUR NEGATIVE   Studies/Results: Nm Hepatobiliary Liver Func  02/01/2014   CLINICAL DATA:  Abdominal pain, nausea and elevated alkaline phosphatase.  EXAM: NUCLEAR MEDICINE HEPATOBILIARY IMAGING  TECHNIQUE: Sequential images of the abdomen were obtained out to 60 minutes following intravenous administration of radiopharmaceutical.  RADIOPHARMACEUTICALS:  5.0 [employer] [id number] Choletec  COMPARISON:  None.  FINDINGS: Following the IV administration of the radiopharmaceutical there is uniform tracer uptake by the liver with clearance from blood pool. Tracer accumulation within the gallbladder is identified by 25 min. The common bile duct is patent and small bowel  activity is identified at 20 min.  IMPRESSION: 1. No evidence for biliary obstruction. 2. Patent cystic duct without evidence for acute cholecystitis.   Electronically Signed   By: Kerby Moors M.D.   On: 02/01/2014 09:40   US Abdomen Complete  01/31/2014   CLINICAL DATA:  Elevated liver enzymes. Epigastric pain. Persistent hematuria.  EXAM: ULTRASOUND ABDOMEN COMPLETE  COMPARISON:  12/21/2013  FINDINGS: Gallbladder: Gallbladder sludge. No wall thickening or focal tenderness.  Common bile duct: Diameter: 4 mm  Liver: There is a 12 mm right lobe mass which is echogenic and without visible color Doppler flow. This is most likely a hemangioma.  IVC: Limited imaging negative  Pancreas: Not  visible  Spleen: Size and appearance within normal limits.  Right Kidney: Length: 13 cm, similar to abdominal CT 12/19/2013. Enlarged and echogenic. No hydronephrosis.  Left Kidney: Length: 13 cm, similar to abdominal CT. Enlarged echogenic. No hydronephrosis. The enlargement echogenicity of the bilateral kidneys is likely from diabetic nephropathy. Renal vein thrombosis is considered unlikely given symmetry, lack of definite enlargement since previous CT, and recently improved creatinine.  Abdominal aorta: No aneurysm visualized.  Other findings: None.  IMPRESSION: 1. Gallbladder sludge.  No evidence of cholecystitis. 2. Large and echogenic bilateral kidneys as seen with diabetic nephropathy. 3. 12 mm mass in the right liver, likely hemangioma.   Electronically Signed   By: Jorje Guild M.D.   On: 01/31/2014 22:46   Medications: I have reviewed the patient's current medications. Scheduled Meds: . sodium chloride  10 mL/hr Intravenous Once  . amLODipine  10 mg Oral Daily  . calcium carbonate  1 tablet Oral TID WC  . carvedilol  12.5 mg Oral BID WC  . escitalopram  20 mg Oral QHS  . furosemide  40 mg Oral Daily  . gabapentin  300 mg Oral TID  . insulin aspart  0-15 Units Subcutaneous TID WC  . insulin aspart  0-5 Units Subcutaneous QHS  . multivitamin with minerals  1 tablet Oral Daily  . nortriptyline  10 mg Oral QHS  . pantoprazole  40 mg Oral BID  . polyethylene glycol  17 g Oral BID  . pravastatin  20 mg Oral q1800  . senna-docusate  2 tablet Oral BID  . sodium chloride  3 mL Intravenous Q12H  . tamsulosin  0.4 mg Oral QPC supper   Continuous Infusions:   PRN Meds:.ondansetron, oxyCODONE Assessment/Plan:  #s/p R BKA:  Presently with clean dressing. Yesterday- eschar suggesting dehiscence but otherwise it appears well healed with minimal sanguinous drainage.  He is taking oxycodone 15 mg po q6hprn for pain. Still having pain in his leg. Was seen today by ortho, no concern for acute  bleeding.  -consulted ortho recs appreciated- Clean dressings -plain film R knee neg for osteo or fractures -ensure ortho f/u on discharge -oxycodone 15 mg po q6hprn - PT eval- recs SNF  #h/o DVT: Per EMR, he had a DVT with PE in 2004 and a repeat DVT in 2011 and is now on chronic warfarin. Presenting INR 6.84 on home regimen of warfarin 7.5 mg qhs. He has had issues in the past missing appointments with Dr Elie Confer with concerns about unmonitored warfarin use. Vit K 5mg  given once on admission -hold warfarin in setting of supratherapeutic anticoagulation -INR today- 3.18.  - Pharm consult for warfarin dosing  #Anemia: CBC s/p 2 units of PRBCs- 8.6 today. Not appropriate response. Presenting hemoglobin on admission 7.7 down from 9.4 twelve days ago. 3  FOBT in 2 weeks- negative  - Consulted GI- doubt Gi bleed- as FOBT- neg, recs - HIDA scan, also to consider Hematology refferal, have signed off. - Complete US- As UA has shown persistent hgb- Large kidneys- related to Diabetic nephropathy. - Observe bowel movements for dark stools, blood - FOBT stool today- negative.  - CBC today, goal- >8. - Daily CBC  Elevated ALP- With epigastric pain. Concern for biliary obstruction, Last [place]- limited, but gall stones seen. - Complete abdominal US- No stones seen.  - HIDA- No evidence for biliary obstruction, Patent cystic duct without evidence for acute cholecystitis.  #Abdominal pain with nausea: Improved. His abdominal pain with nausea is a chronic issue that is likely gastroparesis. Abdominal US and HIDA negative for obstruction. - consider outpatient gastric emptying study, if etiology not identified. - zofran 4 mg q8hprn - GI consulted, recs appreciated, have signed off.  #DM1:  Improved CBGs.  Last hemoglobin A1c was 6.8 on 01/30/2013. As noted above, CIR changed his insulin regimen to 70/30 10 u bid. He is also on gabapentin 300 mg tid.  -SSI moderate with HS coverage.  -hold 70/30 for  now -cont gabapentin 300 mg tid  #HTN: Bp elevated- 150/84. -cont home amlodipine 10 mg daily, increase carvedilol  To 25mg  BID, and cont lasix 40 mg daily  #CKD stage 3: Mr Willets's presenting creatinine is 1.72 with GFR 57. This is improved from baseline creatinine ~ 2.5 on previous admission. Kidney disease likely secondary to diabetes and HTN. UA had >300 protein. -will continue to monitor - Discharge notes- 12/26/2013- Pt ACE-inh was held as there was acute increase in his Cr, this was supposed to be followed up on discharge but pt has not followed up yet since discharge from [facility]. - Cr has trended down gradually to his baseline. Will restart Lisinopril at low dose- 10mg  daily today, as Bp is elevated. Watch Cr.  #HLD: Last lipid profile 02/19/13 with cholesterol 258, triglyceride 329, HDL 45, LDL 147. He is on lovastatin 20 mg daily. -pravastatin 20 mg po daily  #Mood disorder: Mr Hupe was started on lexapro 20 mg qhs and Xanax 0.25 mg bidprn while in CIR for mood stabilization. He is also on nortripyline 10 mg po qhs. -cont lexapro 20 mg qhs, nortriptyline 10 mg qhs -hold xanax 0.25 mg bidprn (last used reportedly 01/20/14)  #GERD: He is on protonix 40 mg po bid and ranitidine 300 mg qhs at home. Ranitidine can increase INR -hold ranitidine 300 mg qhs -cont protonix 40 mg bid, Per Gi, can reduce to once daily on follow up in clinic.  #Diet: Advance as tolerated., presently on Clears.  #DVT PPx: SCDs for now.  #Code: full  Dispo: Disposition is deferred at this time, awaiting improvement of current medical problems.    LOS: 3 days   Bethena Roys, MD 02/01/2014, 1:18 PM

## 2014-02-01 NOTE — Progress Notes (Signed)
Utilization Review Completed.   Myesha Stillion, RN, BSN Nurse Case Manager  

## 2014-02-01 NOTE — Progress Notes (Signed)
ANTICOAGULATION CONSULT NOTE - Initial Consult  Pharmacy Consult for warfarin  Indication: history of chronic dvt and pe  No Known Allergies  Patient Measurements: Height: 6\' 2"  (188 cm) Weight: (!) 343 lb 0.6 oz (155.6 kg) IBW/kg (Calculated) : 82.2  Vital Signs: Temp: 98.8 F (37.1 C) (01/24 0515) Temp Source: Oral (01/24 0515) BP: 164/88 mmHg (01/24 0515) Pulse Rate: 105 (01/24 0515)  Labs:  Recent Labs  01/29/14 2313 01/30/14 1355 01/31/14 0400 01/31/14 1540 02/01/14 0310  HGB 7.7* 7.7* 8.0* 8.8* 8.6*  HCT 24.0* 23.9* 24.8* 27.1* 26.7*  PLT 701* 590* 605* 619* 590*  LABPROT 59.8* 38.9* 29.5*  --  32.8*  INR 6.84* 3.95* 2.78*  --  3.18*  CREATININE 1.72* 1.69*  --   --   --     Estimated Creatinine Clearance: 94.5 mL/min (by C-G formula based on Cr of 1.69).   Medical History: Past Medical History  Diagnosis Date  . DVT (deep venous thrombosis) 09/2002    patient reports additional DVTs in '06 & '11 (unconfirmed)  . Pulmonary embolism 09/2002    treated with 6 months of warfarin  . Hypertension   . Chest pain, neg MI, normal coronaries by cath 02/18/2013  . Hyperlipidemia 02/19/2013  . Acute venous embolism and thrombosis of deep vessels of proximal lower extremity 07/19/2011  . Type I diabetes mellitus dx'd 2001  . GERD (gastroesophageal reflux disease)   . Diabetic ulcer of right foot   . CKD (chronic kidney disease) stage 3, GFR 30-59 ml/min 02/19/2013  . Nephrotic syndrome   . Obesity     BMI 44, weight 346 pounds 01/30/14   Assessment: 39 year old man with history of type 1 diabetes mellitus, recurrent venous thromboembolism (DVT/PE), hypertension, hyperlipidemia, chronic kidney disease, status post right BKA by Dr. Doran Durand on 12/22/2013 due to right foot abscesses and wet gangrene, and other problems as outlined in the medical history, admitted with complaint of nausea/vomiting, intermittent abdominal pain, and fall at home with trauma to his amputation  stump.  Patient on chronic coumadin for his recurrent VTE. Supratherapeutic INR on admission at 6.8 which was reversed with 2 units and 5mg  of vitamin k. INR drifter down to 2.7 but is now back up to 3.18 with no warfarin being given. Hgb appears to have stablized in the 8s, no overt bleeding found. Will continue to hold coumadin until INR trends downward.  Patient was taking 7.5mg  of coumadin daily but issues with monitoring compliance noted. He was scheduled to have his INR check this coming week so no recent anticoag visit is present.  Goal of Therapy:  INR 2-3 Monitor platelets by anticoagulation protocol: Yes   Plan:  Hold warfarin for now Daily INR  Erin Hearing PharmD., BCPS Clinical Pharmacist Pager [phone] 02/01/2014 2:12 PM

## 2014-02-01 NOTE — Clinical Social Work Psychosocial (Signed)
Clinical Social Work Department BRIEF PSYCHOSOCIAL ASSESSMENT 02/01/2014  Patient:  [name], [name]     Account Number:  [id number]     Admit date:  01/29/2014  Clinical Social Worker:  Lovey Newcomer  Date/Time:  02/01/2014 11:44 AM  Referred by:  Physician  Date Referred:  02/01/2014 Referred for  SNF Placement   Other Referral:   NA   Interview type:  Patient Other interview type:   Patient alert and oriented at time of assessment.    PSYCHOSOCIAL DATA Living Status:  PARENTS Admitted from facility:   Level of care:   Primary support name:  [name] Primary support relationship to patient:  PARENT Degree of support available:   Support is fair.    CURRENT CONCERNS Current Concerns  Post-Acute Placement   Other Concerns:   NA    SOCIAL WORK ASSESSMENT / PLAN CSW met with patient at bedside to complete assessment. Patient laying down in bed but did engage in assessment. Patient states that he was admitted from home where he lives with his mother. Patient reports that he is in agreement with SNF placement at discharge as he does not believe he will be able to manage at home. Patient also reports that his mother home is not that accessible for him and he doesn't feel that it's an appropriate environment. Patient did not describe living situation any further. CSW explained SNF search/placement process and answered questions. CSW explained LOG process and patient is agreeable to going to first available facility willing to take an LOG. Patient was calm and engaged in assessment. CSW will followup with bed offers.   Assessment/plan status:  Psychosocial Support/Ongoing Assessment of Needs Other assessment/ plan:   Complete FL2, Fax, PASRR   Information/referral to community resources:   SNF list given.    PATIENT'S/FAMILY'S RESPONSE TO PLAN OF CARE: Patient states that he is agreeable to Dc to a LOG SNF once mediclaly stable. CSW will assist.       Liz Beach MSW, [place], [place], [phone]

## 2014-02-01 NOTE — Clinical Social Work Placement (Signed)
Clinical Social Work Department CLINICAL SOCIAL WORK PLACEMENT NOTE 02/01/2014  Patient:  [name], [name]  Account Number:  [id number] Admit date:  01/29/2014  Clinical Social Worker:  Kemper Durie, [place]  Date/time:  02/01/2014 12:26 PM  Clinical Social Work is seeking post-discharge placement for this patient at the following level of care:   [place]   (*CSW will update this form in Epic as items are completed)   02/01/2014  Patient/family provided with [place] Department of Clinical Social Work's list of facilities offering this level of care within the geographic area requested by the patient (or if unable, by the patient's family).  02/01/2014  Patient/family informed of their freedom to choose among providers that offer the needed level of care, that participate in Medicare, Medicaid or managed care program needed by the patient, have an available bed and are willing to accept the patient.  02/01/2014  Patient/family informed of [facility]' ownership interest in [facility], as well as of the fact that they are under no obligation to receive care at this facility.  PASARR submitted to EDS on  PASARR number received on   FL2 transmitted to all facilities in geographic area requested by pt/family on  02/01/2014 FL2 transmitted to all facilities within larger geographic area on   Patient informed that his/her managed care company has contracts with or will negotiate with  certain facilities, including the following:     Patient/family informed of bed offers received:   Patient chooses bed at  Physician recommends and patient chooses bed at    Patient to be transferred to  on   Patient to be transferred to facility by  Patient and family notified of transfer on [place] NOTE 02/01/2014  Patient:  [name], [name]  Account Number:  [id number] [place] date:  01/29/2014  Clinical Social Worker:   Kemper Durie, [place]  Date/time:  02/01/2014 12:26 PM  Clinical Social Work is seeking post-discharge placement for this patient at the following level of care:   [place]   (*CSW will update this form in Epic as items are completed)   02/01/2014  Patient/family provided with [place] Department of Clinical Social Work's list of facilities offering this level of care within the geographic area requested by the patient (or if unable, by the patient's family).  02/01/2014  Patient/family informed of their freedom to choose among providers that offer the needed level of care, that participate in Medicare, Medicaid or managed care program needed by the patient, have an available bed and are willing to accept the patient.  02/01/2014  Patient/family informed of [facility]' ownership interest in [facility], as well as of the fact that they are under no obligation to receive care at this facility.  PASARR submitted to EDS on  PASARR number received on   FL2 transmitted to all facilities in geographic area requested by pt/family on  02/01/2014 FL2 transmitted to all facilities within larger geographic area on   Patient informed that his/her managed care company has contracts with or will negotiate with  certain facilities, including the following:     Patient/family informed of bed offers received:   Patient chooses bed at  Physician recommends and patient chooses bed at    Patient to be transferred to  on   Patient to be transferred to facility by  Patient and family notified of transfer on  Name of family member notified:  The following physician request were entered in Epic:   Additional Comments:    Liz Beach MSW, [place], [id number] Name of family member notified:    The following physician request were entered in Epic:   Additional Comments:

## 2014-02-02 DIAGNOSIS — E109 Type 1 diabetes mellitus without complications: Secondary | ICD-10-CM | POA: Diagnosis not present

## 2014-02-02 DIAGNOSIS — R109 Unspecified abdominal pain: Secondary | ICD-10-CM | POA: Diagnosis not present

## 2014-02-02 DIAGNOSIS — D649 Anemia, unspecified: Secondary | ICD-10-CM | POA: Diagnosis not present

## 2014-02-02 DIAGNOSIS — R110 Nausea: Secondary | ICD-10-CM | POA: Diagnosis not present

## 2014-02-02 LAB — GLUCOSE, CAPILLARY
GLUCOSE-CAPILLARY: 173 mg/dL — AB (ref 70–99)
GLUCOSE-CAPILLARY: 156 mg/dL — AB (ref 70–99)
Glucose-Capillary: 162 mg/dL — ABNORMAL HIGH (ref 70–99)
Glucose-Capillary: 138 mg/dL — ABNORMAL HIGH (ref 70–99)

## 2014-02-02 LAB — BASIC METABOLIC PANEL
Anion gap: 10 (ref 5–15)
BUN: 16 mg/dL (ref 6–23)
CALCIUM: 8.2 mg/dL — AB (ref 8.4–10.5)
CO2: 22 mmol/L (ref 19–32)
CREATININE: 1.39 mg/dL — AB (ref 0.50–1.35)
Chloride: 102 mmol/L (ref 96–112)
GFR calc non Af Amer: 64 mL/min — ABNORMAL LOW (ref >90)
GFR, EST AFRICAN AMERICAN: 74 mL/min — AB (ref >90)
Glucose, Bld: 177 mg/dL — ABNORMAL HIGH (ref 70–99)
Potassium: 3.5 mmol/L (ref 3.5–5.1)
Sodium: 134 mmol/L — ABNORMAL LOW (ref 135–145)

## 2014-02-02 LAB — PROTIME-INR
INR: 4.68 — AB (ref 0.00–1.49)
INR: 4.95 — ABNORMAL HIGH (ref 0.00–1.49)
Prothrombin Time: 44.4 seconds — ABNORMAL HIGH (ref 11.6–15.2)
Prothrombin Time: 46.4 seconds — ABNORMAL HIGH (ref 11.6–15.2)

## 2014-02-02 LAB — CBC
HEMATOCRIT: 27.3 % — AB (ref 39.0–52.0)
HEMOGLOBIN: 9.1 g/dL — AB (ref 13.0–17.0)
MCH: 27.2 pg (ref 26.0–34.0)
MCHC: 33.3 g/dL (ref 30.0–36.0)
MCV: 81.5 fL (ref 78.0–100.0)
Platelets: 572 10*3/uL — ABNORMAL HIGH (ref 150–400)
RBC: 3.35 MIL/uL — AB (ref 4.22–5.81)
RDW: 14.4 % (ref 11.5–15.5)
WBC: 11.9 10*3/uL — ABNORMAL HIGH (ref 4.0–10.5)

## 2014-02-02 MED ORDER — CARVEDILOL 25 MG PO TABS
25.0000 mg | ORAL_TABLET | Freq: Two times a day (BID) | ORAL | Status: DC
  Administered 2014-02-02 – 2014-02-04 (×4): 25 mg via ORAL
  Filled 2014-02-02 (×6): qty 1

## 2014-02-02 NOTE — Progress Notes (Signed)
ANTICOAGULATION CONSULT NOTE - Initial Consult  Pharmacy Consult for warfarin  Indication: history of chronic dvt and pe  No Known Allergies  Patient Measurements: Height: 6\' 2"  (188 cm) Weight: (!) 339 lb 1.1 oz (153.8 kg) IBW/kg (Calculated) : 82.2  Vital Signs: Temp: 98.9 F (37.2 C) (01/25 0549) Temp Source: Oral (01/25 0549) BP: 158/76 mmHg (01/25 0549) Pulse Rate: 107 (01/25 0549)  Labs:  Recent Labs  01/30/14 1355 01/31/14 0400 01/31/14 1540 02/01/14 0310 02/02/14 0520 02/02/14 0928  HGB 7.7* 8.0* 8.8* 8.6* 9.1*  --   HCT 23.9* 24.8* 27.1* 26.7* 27.3*  --   PLT 590* 605* 619* 590* 572*  --   LABPROT 38.9* 29.5*  --  32.8*  --  44.4*  INR 3.95* 2.78*  --  3.18*  --  4.68*  CREATININE 1.69*  --   --   --   --  1.39*    Estimated Creatinine Clearance: 114 mL/min (by C-G formula based on Cr of 1.39).   Medical History: Past Medical History  Diagnosis Date  . DVT (deep venous thrombosis) 09/2002    patient reports additional DVTs in '06 & '11 (unconfirmed)  . Pulmonary embolism 09/2002    treated with 6 months of warfarin  . Hypertension   . Chest pain, neg MI, normal coronaries by cath 02/18/2013  . Hyperlipidemia 02/19/2013  . Acute venous embolism and thrombosis of deep vessels of proximal lower extremity 07/19/2011  . Type I diabetes mellitus dx'd 2001  . GERD (gastroesophageal reflux disease)   . Diabetic ulcer of right foot   . CKD (chronic kidney disease) stage 3, GFR 30-59 ml/min 02/19/2013  . Nephrotic syndrome   . Obesity     BMI 44, weight 346 pounds 01/30/14   Assessment: 38-YOM on chronic coumadin for recurrent DVT/PE, INR supratherapeutic at 6.8 on admission, which was reversed with 5mg  vitamin K PO. INR down to 2.7 afterwards, but now bounced back to 4.68. No coumadin since admission. Hgb 9.1, stable after transfusion, plt wnl, FOBT neg 1/24.   Patient was taking 7.5mg  of coumadin daily but issues with monitoring compliance noted. He was  scheduled to have his INR check this coming week so no recent anticoag visit is present.  Goal of Therapy:  INR 2-3 Monitor platelets by anticoagulation protocol: Yes   Plan:  Hold warfarin for now Daily INR  Maryanna Shape, PharmD, BCPS  Clinical Pharmacist  Pager: [phone]  02/02/2014 12:46 PM

## 2014-02-02 NOTE — Progress Notes (Signed)
UR completed 

## 2014-02-02 NOTE — Consult Note (Signed)
WOC wound consult note Reason for Consult: RBKA 12.14.15 per Dr. Doran Durand.  Necrotic wound at incision site Wound type: skin necrosis at surgical site Measurement:16cm x 4cm x 0 Wound bed:100% eschar with some incisional separation along the proximal edge of the wound Drainage (amount, consistency, odor) moderate, dried sanguinous drainage on the dressing.  Oozing with prop of the skin separation and small amount of pus with palpation at this same site.    Periwound: intact  Dressing procedure/placement/frequency: Ortho has been following their patient and continues to document no topical debridement needed.  I have added to paint the area with betadine to attempt to keep the eschar as stable as possible and dry the areas of slight fluctuance at the staple line. Left message with Dr. Nona Dell office with my assessment.    Discussed POC with patient and bedside nurse.  Re consult if needed, will not follow at this time. Thanks  Kaid Seeberger [place], [place] [phone])

## 2014-02-02 NOTE — Clinical Social Work Note (Addendum)
4:35pm  [facility] can not accept patient due to high level of needs.  4:30pm Patient still has no bed offers at this time.  CSW has provided after hours CSW contact info to both facilities following patient.  [facility]  [phone] office hours are 8am-5pm.  3:00pm CSW consulted supervisor to help find patient placement.  [facility] does not have any bariatric beds available.  [facility] is still reviewing patient information- offer pending.  [facility] was faxed patient info by supervisor- facility to review and determine if patient can be admitted.  1:45pm No bed offers at this time.  CSW will continue to search for LOG bed placement.  Domenica Reamer, [place] Social Worker [phone]

## 2014-02-02 NOTE — Evaluation (Signed)
Occupational Therapy Evaluation Patient Details Name: [name] MRN: [id number] DOB: [birth year] Today's Date: 02/02/2014    History of Present Illness Patient is a 39 year old man with history of type 1 diabetes mellitus, recurrent venous thromboembolism (DVT/PE), hypertension, hyperlipidemia, chronic kidney disease, status post right BKA by Dr. Doran Durand on 12/22/2013 due to right foot abscesses and wet gangrene, and other problems as outlined in the medical history, admitted with complaint of nausea/vomiting, intermittent abdominal pain, and fall at home with trauma to his amputation stump   Clinical Impression   Pt reports that being at home with his girlfriend since prior discharge "did not go well."  Pt reporting that standing was not an issue, but he was not able to hop.  He was needing assist for bathing, dressing and toileting.  Pt able to perform seated ADL with min to set up with good sitting balance.  He is dependent in LB ADL and pericare.  Pt not able to clear his foot in order to hop for transfers or stand without total support of his hands on the walker. Will follow acutely.  Pt will need post acute rehab.    Follow Up Recommendations  SNF    Equipment Recommendations       Recommendations for Other Services       Precautions / Restrictions Precautions Precautions: Fall Restrictions Weight Bearing Restrictions: No      Mobility Bed Mobility Overal bed mobility: Modified Independent                Transfers Overall transfer level: Needs assistance Equipment used: Rolling walker (2 wheeled) Transfers: Sit to/from Stand Sit to Stand: Min guard;+2 safety/equipment         General transfer comment: Pt denied transfer to 3 in1 or chair, appears fearful of falling.    Balance Overall balance assessment: Needs assistance   Sitting balance-[name]: Good Sitting balance - Comments: sat EOB with UEs free for ADL   Standing balance support: Bilateral  upper extremity supported Standing balance-[name]: Poor                              ADL Overall ADL's : Needs assistance/impaired Eating/Feeding: Independent;Bed level   Grooming: Set up;Sitting   Upper Body Bathing: Sitting;Minimal assitance Upper Body Bathing Details (indicate cue type and reason): assist for back Lower Body Bathing: Total assistance;Bed level   Upper Body Dressing : Minimal assistance;Sitting   Lower Body Dressing: Total assistance;Bed level       Toileting- Clothing Manipulation and Hygiene: Total assistance;Bed level         General ADL Comments: Pt vague about how he was functioning at his girlfriend's home.  Reported that "it wasn't the standing that was the problem, it was the hopping."     Vision                     Perception     Praxis      Pertinent Vitals/Pain Pain Assessment: 0-10 Pain Score: 10-Worst pain ever Pain Location: R LE Pain Descriptors / Indicators: Aching Pain Intervention(s): Limited activity within patient's tolerance;Monitored during session;Repositioned;Patient requesting pain meds-RN notified     Hand Dominance Right   Extremity/Trunk Assessment Upper Extremity Assessment Upper Extremity Assessment: Overall WFL for tasks assessed   Lower Extremity Assessment Lower Extremity Assessment: Defer to PT evaluation RLE Deficits / Details: BKA from previous admission 12/22/13  Communication Communication Communication: No difficulties   Cognition Arousal/Alertness: Awake/alert Behavior During Therapy: Flat affect Overall Cognitive Status: Within Functional Limits for tasks assessed                     General Comments       Exercises       Shoulder Instructions      Home Living Family/patient expects to be discharged to:: Skilled nursing facility Living Arrangements: Parent;Spouse/significant other (girlfriend) Available Help at Discharge: Friend(s);Available  PRN/intermittently Type of Home: Apartment Home Access: Stairs to enter Entrance Stairs-Number of Steps: 2 + 1 to enter mom's house;  6 steps to enter girlfriend's house Entrance Stairs-Rails: None Home Layout: One level;Two level (2 level at girlfriends) Alternate Level Stairs-Number of Steps: two level home at mother's with full flight to get to bedroom and full bath, girlfriend's home is one level Alternate Level Stairs-Rails: Left Bathroom Shower/Tub: Tub/shower unit;Curtain   Bathroom Toilet: Standard Bathroom Accessibility: No   Home Equipment: Wheelchair - [employer] - 2 wheels;Bedside commode;Shower seat   Additional Comments: Has been staying with his girlfriend      Prior Functioning/Environment Level of Independence: Needs assistance  Gait / Transfers Assistance Needed: Ambulating short distances with RW. Using w/c primarily. ADL's / Homemaking Assistance Needed: Needs assist with bath/dress   Comments: Independent prior to admission on 12/09/13.    OT Diagnosis: Generalized weakness;Acute pain   OT Problem List: Decreased strength;Decreased activity tolerance;Impaired balance (sitting and/or standing);Decreased safety awareness;Decreased knowledge of use of DME or AE;Obesity;Pain   OT Treatment/Interventions: Self-care/ADL training;DME and/or AE instruction;Therapeutic exercise;Patient/family education;Balance training    OT Goals(Current goals can be found in the care plan section) Acute Rehab OT Goals Patient Stated Goal: Go to rehab so I can be independent again OT Goal Formulation: With patient Time For Goal Achievement: 02/16/14 Potential to Achieve Goals: Good ADL Goals Pt Will Perform Lower Body Bathing: with supervision;sit to/from stand Pt Will Perform Lower Body Dressing: with supervision;sit to/from stand Pt Will Transfer to Toilet: with supervision;ambulating;bedside commode Pt Will Perform Toileting - Clothing Manipulation and hygiene: with  supervision;sit to/from stand Pt/caregiver will Perform Home Exercise Program: Both right and left upper extremity;With theraband  OT Frequency: Min 2X/week   Barriers to D/C: Inaccessible home environment;Decreased caregiver support          Co-evaluation              End of Session Nurse Communication: Patient requests pain meds  Activity Tolerance: Patient limited by pain;Patient limited by fatigue Patient left: in bed;with call bell/phone within reach   Time: 1100-1123 OT Time Calculation (min): 23 min Charges:  OT General Charges $OT Visit: 1 Procedure OT Evaluation $Initial OT Evaluation Tier I: 1 Procedure G-Codes: OT G-codes **NOT FOR INPATIENT CLASS** Functional Assessment Tool Used: clinical judgement Functional Limitation: Self care Self Care Current Status [id number]): At least 60 percent but less than 80 percent impaired, limited or restricted Self Care Goal Status [id number]): At least 20 percent but less than 40 percent impaired, limited or restricted  Malka So 02/02/2014, 11:27 AM

## 2014-02-02 NOTE — Progress Notes (Signed)
Subjective: Pt denies nausea. States he has a decreased appetite.   Objective: Vital signs in last 24 hours: Filed Vitals:   02/01/14 0515 02/01/14 1425 02/01/14 1958 02/02/14 0549  BP: 164/88 150/84 162/92 158/76  Pulse: 105 105 108 107  Temp: 98.8 F (37.1 C) 98.7 F (37.1 C) 98.1 F (36.7 C) 98.9 F (37.2 C)  TempSrc: Oral Oral Oral Oral  Resp: 19 20 20 22   Height:      Weight: 343 lb 0.6 oz (155.6 kg)   339 lb 1.1 oz (153.8 kg)  SpO2: 96% 96% 100% 100%   Weight change: -3 lb 15.5 oz (-1.8 kg)  Intake/Output Summary (Last 24 hours) at 02/02/14 1201 Last data filed at 02/02/14 0900  Gross per 24 hour  Intake    840 ml  Output   1900 ml  Net  -1060 ml   Vitals reviewed. General: NAD, laying in bed comfortably, awake, alert, in no distress. Lungs: CTAB, no wheezes Cardiac: RRR, no murmurs GI: soft, obese, no tenderness epigastric area, normoactive bowel sounds Ext: rt BKA with eschar at lateral edge of incision site. Sutures intact.  Neuro: No obvious deficits.  Lab Results: Basic Metabolic Panel:  Recent Labs Lab 01/30/14 1355 02/02/14 0928  NA 137 134*  K 4.3 3.5  CL 105 102  CO2 25 22  GLUCOSE 205* 177*  BUN 20 16  CREATININE 1.69* 1.39*  CALCIUM 8.6 8.2*   Liver Function Tests:  Recent Labs Lab 01/29/14 2313 01/30/14 1355  AST 17 15  ALT 23 20  ALKPHOS 177* 162*  BILITOT 0.5 0.5  PROT 7.2 6.9  ALBUMIN 2.0* 1.8*    Recent Labs Lab 01/29/14 2313  LIPASE 39   CBC:  Recent Labs Lab 01/29/14 2313  02/01/14 0310 02/02/14 0520  WBC 12.9*  < > 11.6* 11.9*  NEUTROABS 9.2*  --   --   --   HGB 7.7*  < > 8.6* 9.1*  HCT 24.0*  < > 26.7* 27.3*  MCV 82.5  < > 82.7 81.5  PLT 701*  < > 590* 572*  < > = values in this interval not displayed. CBG:  Recent Labs Lab 01/31/14 2143 02/01/14 0627 02/01/14 1125 02/01/14 1613 02/01/14 2152 02/02/14 0630  GLUCAP 135* 116* 154* 167* 171* 162*   Coagulation:  Recent Labs Lab  01/30/14 1355 01/31/14 0400 02/01/14 0310 02/02/14 0928  LABPROT 38.9* 29.5* 32.8* 44.4*  INR 3.95* 2.78* 3.18* 4.68*   Urinalysis:  Recent Labs Lab 01/30/14 0114  COLORURINE YELLOW  LABSPEC 1.013  PHURINE 7.0  GLUCOSEU 250*  HGBUR MODERATE*  BILIRUBINUR NEGATIVE  KETONESUR NEGATIVE  PROTEINUR >300*  UROBILINOGEN 0.2  NITRITE NEGATIVE  LEUKOCYTESUR NEGATIVE   Studies/Results: Nm Hepatobiliary Liver Func  02/01/2014   CLINICAL DATA:  Abdominal pain, nausea and elevated alkaline phosphatase.  EXAM: NUCLEAR MEDICINE HEPATOBILIARY IMAGING  TECHNIQUE: Sequential images of the abdomen were obtained out to 60 minutes following intravenous administration of radiopharmaceutical.  RADIOPHARMACEUTICALS:  5.0 [employer] 123XX123 Choletec  COMPARISON:  None.  FINDINGS: Following the IV administration of the radiopharmaceutical there is uniform tracer uptake by the liver with clearance from blood pool. Tracer accumulation within the gallbladder is identified by 25 min. The common bile duct is patent and small bowel activity is identified at 20 min.  IMPRESSION: 1. No evidence for biliary obstruction. 2. Patent cystic duct without evidence for acute cholecystitis.   Electronically Signed   By: Kerby Moors M.D.   On:  02/01/2014 09:40   US Abdomen Complete  01/31/2014   CLINICAL DATA:  Elevated liver enzymes. Epigastric pain. Persistent hematuria.  EXAM: ULTRASOUND ABDOMEN COMPLETE  COMPARISON:  12/21/2013  FINDINGS: Gallbladder: Gallbladder sludge. No wall thickening or focal tenderness.  Common bile duct: Diameter: 4 mm  Liver: There is a 12 mm right lobe mass which is echogenic and without visible color Doppler flow. This is most likely a hemangioma.  IVC: Limited imaging negative  Pancreas: Not visible  Spleen: Size and appearance within normal limits.  Right Kidney: Length: 13 cm, similar to abdominal CT 12/19/2013. Enlarged and echogenic. No hydronephrosis.  Left Kidney: Length: 13 cm, similar to  abdominal CT. Enlarged echogenic. No hydronephrosis. The enlargement echogenicity of the bilateral kidneys is likely from diabetic nephropathy. Renal vein thrombosis is considered unlikely given symmetry, lack of definite enlargement since previous CT, and recently improved creatinine.  Abdominal aorta: No aneurysm visualized.  Other findings: None.  IMPRESSION: 1. Gallbladder sludge.  No evidence of cholecystitis. 2. Large and echogenic bilateral kidneys as seen with diabetic nephropathy. 3. 12 mm mass in the right liver, likely hemangioma.   Electronically Signed   By: Jorje Guild M.D.   On: 01/31/2014 22:46   Medications: I have reviewed the patient's current medications. Scheduled Meds: . sodium chloride  10 mL/hr Intravenous Once  . amLODipine  10 mg Oral Daily  . calcium carbonate  1 tablet Oral TID WC  . carvedilol  12.5 mg Oral BID WC  . escitalopram  20 mg Oral QHS  . furosemide  40 mg Oral Daily  . gabapentin  300 mg Oral TID  . insulin aspart  0-15 Units Subcutaneous TID WC  . insulin aspart  0-5 Units Subcutaneous QHS  . lisinopril  10 mg Oral Daily  . multivitamin with minerals  1 tablet Oral Daily  . nortriptyline  10 mg Oral QHS  . pantoprazole  40 mg Oral BID  . pravastatin  20 mg Oral q1800  . senna-docusate  2 tablet Oral QHS  . sodium chloride  3 mL Intravenous Q12H  . tamsulosin  0.4 mg Oral QPC supper  . Warfarin - Pharmacist Dosing Inpatient   Does not apply q1800   Continuous Infusions:   PRN Meds:.ondansetron, oxyCODONE Assessment/Plan:  #s/p R BKA:  plain film R knee neg for osteo or fractures -consulted ortho recs appreciated- Clean dressings daily -ensure ortho f/u on discharge -oxycodone 15 mg po q6hprn - PT eval- recs SNF  #h/o DVT: Per EMR, he had a DVT with PE in 2004 and a repeat DVT in 2011 and is now on chronic warfarin. -hold warfarin in setting of supratherapeutic anticoagulation -INR today- 4.68 down from 6.84 on admission - Pharm consult  for warfarin dosing  #Anemia:  3 FOBT in 2 weeks- negative. Hgb 9.1 today. - Consulted GI- doubt Gi bleed- as FOBT- neg,  - Complete US- As UA has shown persistent hgb- Large kidneys- related to Diabetic nephropathy. - Observe bowel movements for dark stools and blood - FOBT stool today- negative.  - CBC daily w/  Goal hemoglobin- >8.  Elevated ALP- GGT on 12/20/13 elevated.  - Complete abdominal US- No stones seen.  - HIDA- No evidence for biliary obstruction, Patent cystic duct without evidence for acute cholecystitis.  #Abdominal pain with nausea: Improved. His abdominal pain with nausea is a chronic issue that is likely gastroparesis. Abdominal US and HIDA negative for obstruction. - consider outpatient gastric emptying study, if etiology not identified. -  zofran 4 mg q8hprn - GI consulted, recs appreciated, have signed off.  #DM1:  Improved CBGs.  Last hemoglobin A1c was 6.8 on 01/30/2013. As noted above, CIR changed his insulin regimen to 70/30 10 u bid. He is also on gabapentin 300 mg tid.  -SSI moderate with HS coverage.  -hold 70/30 for now -cont gabapentin 300 mg tid - added lisinopril 10mg  yesterday  #HTN: Bp elevated-  -cont home amlodipine 10 mg daily,  carvedilol  25mg  BID, and cont lasix 40 mg daily - added lisinopril  yesterday  #CKD stage 3: Mr Azam's presenting creatinine is 1.72 with GFR 57. This is improved from baseline creatinine ~ 2.5 on previous admission. Kidney disease likely secondary to diabetes and HTN. UA had >300 protein. -will continue to monitor - Discharge notes- 12/26/2013- Pt ACE-inh was held as there was acute increase in his Cr, this was supposed to be followed up on discharge but pt has not followed up yet since discharge from CIR. - Cr has trended down gradually to his baseline. Restaredt Lisinopril at low dose- 10mg  daily as Bp is elevated. Creatinine down to 1.39 from 1.69 yesterday.   #HLD: Last lipid profile 02/19/13 with cholesterol 258,  triglyceride 329, HDL 45, LDL 147. He is on lovastatin 20 mg daily. -pravastatin 20 mg po daily  #Mood disorder: Mr Poston was started on lexapro 20 mg qhs and Xanax 0.25 mg bidprn while in CIR for mood stabilization. He is also on nortripyline 10 mg po qhs. -cont lexapro 20 mg qhs, nortriptyline 10 mg qhs -hold xanax 0.25 mg bidprn (last used reportedly 01/20/14)  #GERD: He is on protonix 40 mg po bid and ranitidine 300 mg qhs at home. Ranitidine can increase INR -hold ranitidine 300 mg qhs -cont protonix 40 mg bid, Per Gi, can reduce to once daily on follow up in clinic.  #Diet: Advance as tolerated, presently on Clears.  #DVT PPx: SCDs for now.  #Code: full  Dispo: Disposition is deferred at this time, awaiting improvement of current medical problems.    LOS: 4 days   Julious Oka, MD 02/02/2014, 12:01 PM

## 2014-02-02 NOTE — Progress Notes (Signed)
Physical Therapy Treatment Patient Details Name: [name] MRN: [id number] DOB: [birth year] Today's Date: 02/02/2014    History of Present Illness Patient is a 39 year old man with history of type 1 diabetes mellitus, recurrent venous thromboembolism (DVT/PE), hypertension, hyperlipidemia, chronic kidney disease, status post right BKA by Dr. Doran Durand on 12/22/2013 due to right foot abscesses and wet gangrene, and other problems as outlined in the medical history, admitted with complaint of nausea/vomiting, intermittent abdominal pain, and fall at home with trauma to his amputation stump    PT Comments    Pt much improved this session and able to come to stand with only MinG, but unable to clear foot from floor to attempt "hop" or pivot to recliner.  Continue to feel pt would benefit from ST-SNF for rehab to maximize independence prior to returning to home.    Follow Up Recommendations  SNF     Equipment Recommendations  None recommended by PT    Recommendations for Other Services       Precautions / Restrictions Precautions Precautions: Fall Restrictions Weight Bearing Restrictions: No    Mobility  Bed Mobility Overal bed mobility: Modified Independent                Transfers Overall transfer level: Needs assistance Equipment used: Rolling walker (2 wheeled) Transfers: Sit to/from Stand Sit to Stand: Min guard;+2 safety/equipment         General transfer comment: pt much improved with coming to stand today and not requiring any physical A.  Had pt work on "hops" in place at EOB and pt unable to clear foot from floor.  Repeated coming to stand x3 for strengthening and endurance.  pt denied up to chair today ?2/2 fearful.    Ambulation/Gait                 Stairs            Wheelchair Mobility    Modified Rankin (Stroke Patients Only)       Balance Overall balance assessment: Needs assistance         Standing balance support: Bilateral  upper extremity supported Standing balance-[name]: Poor                      Cognition Arousal/Alertness: Awake/alert Behavior During Therapy: WFL for tasks assessed/performed Overall Cognitive Status: Within Functional Limits for tasks assessed                      Exercises      General Comments        Pertinent Vitals/Pain Pain Assessment: No/denies pain (except occaional phantom pains.  )    Home Living                      Prior Function            PT Goals (current goals can now be found in the care plan section) Acute Rehab PT Goals Patient Stated Goal: Go to rehab so I can be independent again PT Goal Formulation: With patient Time For Goal Achievement: 02/14/14 Potential to Achieve Goals: Good Progress towards PT goals: Progressing toward goals    Frequency  Min 3X/week    PT Plan Current plan remains appropriate    Co-evaluation             End of Session Equipment Utilized During Treatment: Gait belt Activity Tolerance: Patient tolerated treatment well Patient left: in bed;with call  bell/phone within reach (sitting EOB)     Time: [id number] PT Time Calculation (min) (ACUTE ONLY): 26 min  Charges:  $Therapeutic Activity: 23-37 mins                    G CodesCatarina Hartshorn, [place] [id number] 02/02/2014, 9:38 AM

## 2014-02-03 DIAGNOSIS — T8789 Other complications of amputation stump: Secondary | ICD-10-CM

## 2014-02-03 DIAGNOSIS — Y839 Surgical procedure, unspecified as the cause of abnormal reaction of the patient, or of later complication, without mention of misadventure at the time of the procedure: Secondary | ICD-10-CM

## 2014-02-03 DIAGNOSIS — Z7901 Long term (current) use of anticoagulants: Secondary | ICD-10-CM

## 2014-02-03 DIAGNOSIS — E1022 Type 1 diabetes mellitus with diabetic chronic kidney disease: Secondary | ICD-10-CM

## 2014-02-03 DIAGNOSIS — Z86718 Personal history of other venous thrombosis and embolism: Secondary | ICD-10-CM

## 2014-02-03 DIAGNOSIS — Z89511 Acquired absence of right leg below knee: Secondary | ICD-10-CM | POA: Diagnosis not present

## 2014-02-03 DIAGNOSIS — D649 Anemia, unspecified: Secondary | ICD-10-CM | POA: Diagnosis present

## 2014-02-03 DIAGNOSIS — Z794 Long term (current) use of insulin: Secondary | ICD-10-CM

## 2014-02-03 LAB — CBC
HCT: 26.3 % — ABNORMAL LOW (ref 39.0–52.0)
Hemoglobin: 8.8 g/dL — ABNORMAL LOW (ref 13.0–17.0)
MCH: 27.8 pg (ref 26.0–34.0)
MCHC: 33.5 g/dL (ref 30.0–36.0)
MCV: 83.2 fL (ref 78.0–100.0)
Platelets: 486 10*3/uL — ABNORMAL HIGH (ref 150–400)
RBC: 3.16 MIL/uL — AB (ref 4.22–5.81)
RDW: 14.6 % (ref 11.5–15.5)
WBC: 10 10*3/uL (ref 4.0–10.5)

## 2014-02-03 LAB — GLUCOSE, CAPILLARY
GLUCOSE-CAPILLARY: 173 mg/dL — AB (ref 70–99)
Glucose-Capillary: 160 mg/dL — ABNORMAL HIGH (ref 70–99)
Glucose-Capillary: 251 mg/dL — ABNORMAL HIGH (ref 70–99)
Glucose-Capillary: 110 mg/dL — ABNORMAL HIGH (ref 70–99)

## 2014-02-03 LAB — PROTIME-INR
INR: 5.45 (ref 0.00–1.49)
Prothrombin Time: 50 seconds — ABNORMAL HIGH (ref 11.6–15.2)

## 2014-02-03 MED ORDER — PHYTONADIONE 5 MG PO TABS
10.0000 mg | ORAL_TABLET | Freq: Once | ORAL | Status: AC
  Administered 2014-02-03: 10 mg via ORAL
  Filled 2014-02-03: qty 2

## 2014-02-03 NOTE — Telephone Encounter (Signed)
Pt is in hospital at this time.

## 2014-02-03 NOTE — Progress Notes (Signed)
Subjective: Pt denies nausea. Requesting solid foods.   Objective: Vital signs in last 24 hours: Filed Vitals:   02/02/14 0549 02/02/14 1423 02/03/14 0515 02/03/14 1000  BP: 158/76 161/85 159/82 146/79  Pulse: 107 96 93 88  Temp: 98.9 F (37.2 C) 98.4 F (36.9 C) 98.2 F (36.8 C) 98.9 F (37.2 C)  TempSrc: Oral Oral Oral Oral  Resp: 22 20 18 18   Height:      Weight: 339 lb 1.1 oz (153.8 kg)  339 lb 4.6 oz (153.9 kg)   SpO2: 100% 99% 95% 100%   Weight change: 3.5 oz (0.1 kg)  Intake/Output Summary (Last 24 hours) at 02/03/14 1109 Last data filed at 02/03/14 0948  Gross per 24 hour  Intake    720 ml  Output      0 ml  Net    720 ml   Vitals reviewed. General: NAD, laying in bed comfortably, awake, alert, in no distress. Lungs: CTAB, no wheezes Cardiac: RRR, no murmurs GI: soft, obese, no tenderness epigastric area, normoactive bowel sounds Ext: rt BKA with eschar at lateral edge of incision site. Sutures intact.  Neuro: No obvious deficits.  Lab Results: Basic Metabolic Panel:  Recent Labs Lab 01/30/14 1355 02/02/14 0928  NA 137 134*  K 4.3 3.5  CL 105 102  CO2 25 22  GLUCOSE 205* 177*  BUN 20 16  CREATININE 1.69* 1.39*  CALCIUM 8.6 8.2*   Liver Function Tests:  Recent Labs Lab 01/29/14 2313 01/30/14 1355  AST 17 15  ALT 23 20  ALKPHOS 177* 162*  BILITOT 0.5 0.5  PROT 7.2 6.9  ALBUMIN 2.0* 1.8*    Recent Labs Lab 01/29/14 2313  LIPASE 39   CBC:  Recent Labs Lab 01/29/14 2313  02/02/14 0520 02/03/14 0545  WBC 12.9*  < > 11.9* 10.0  NEUTROABS 9.2*  --   --   --   HGB 7.7*  < > 9.1* 8.8*  HCT 24.0*  < > 27.3* 26.3*  MCV 82.5  < > 81.5 83.2  PLT 701*  < > 572* 486*  < > = values in this interval not displayed. CBG:  Recent Labs Lab 02/01/14 2152 02/02/14 0630 02/02/14 1201 02/02/14 1625 02/02/14 2119 02/03/14 0610  GLUCAP 171* 162* 173* 138* 156* 160*   Coagulation:  Recent Labs Lab 01/31/14 0400 02/01/14 0310  02/02/14 0928 02/02/14 1805  LABPROT 29.5* 32.8* 44.4* 46.4*  INR 2.78* 3.18* 4.68* 4.95*   Urinalysis:  Recent Labs Lab 01/30/14 0114  COLORURINE YELLOW  LABSPEC 1.013  PHURINE 7.0  GLUCOSEU 250*  HGBUR MODERATE*  BILIRUBINUR NEGATIVE  KETONESUR NEGATIVE  PROTEINUR >300*  UROBILINOGEN 0.2  NITRITE NEGATIVE  LEUKOCYTESUR NEGATIVE   Studies/Results: No results found. Medications: I have reviewed the patient's current medications. Scheduled Meds: . sodium chloride  10 mL/hr Intravenous Once  . amLODipine  10 mg Oral Daily  . calcium carbonate  1 tablet Oral TID WC  . carvedilol  25 mg Oral BID WC  . escitalopram  20 mg Oral QHS  . furosemide  40 mg Oral Daily  . gabapentin  300 mg Oral TID  . insulin aspart  0-15 Units Subcutaneous TID WC  . insulin aspart  0-5 Units Subcutaneous QHS  . lisinopril  10 mg Oral Daily  . multivitamin with minerals  1 tablet Oral Daily  . nortriptyline  10 mg Oral QHS  . pantoprazole  40 mg Oral BID  . pravastatin  20 mg  Oral q1800  . senna-docusate  2 tablet Oral QHS  . sodium chloride  3 mL Intravenous Q12H  . tamsulosin  0.4 mg Oral QPC supper  . Warfarin - Pharmacist Dosing Inpatient   Does not apply q1800   Continuous Infusions:   PRN Meds:.ondansetron, oxyCODONE Assessment/Plan:  #s/p R BKA:  plain film R knee neg for osteo or fractures -consulted ortho recs appreciated- Clean dressings daily -ensure ortho f/u on discharge -oxycodone 15 mg po q6hprn - PT eval- recs SNF - SW looking for bed placement  #h/o DVT on coumadin: Per EMR, he had a DVT with PE in 2004 and a repeat DVT in 2011 and is now on chronic warfarin. -hold warfarin in setting of supratherapeutic anticoagulation -INR today 5.45., INR 4.68 yesterday which was rechecked and found to be 4.95. Pt received 5mg  vit K on 1/22, per pharm may transiently lower INR and then have rebound effect. Also, pt took 7.5mg  coumadin daily and due to half life of coumadin may now  be seeing effects of increased daily dose. Will continue to monitor. Not actively bleeding. Can consider another 5mg  vit K PO.  - also pt's PO intake has been variable, thus affecting his coumadin dose, pharmacy recommends starting on eliquis 2/2 difficult to control INR levels w/ coumadin. - Pharm consult for warfarin dosing  #Anemia:  3 FOBT in 2 weeks- negative. Hgb 8.8 today. - Consulted GI- doubt Gi bleed- as FOBT- neg - Complete US- As UA has shown persistent hgb- Large kidneys- related to Diabetic nephropathy. - Observe bowel movements for dark stools and blood - CBC daily w/  Goal hemoglobin- >8.  Elevated ALP- GGT on 12/20/13 elevated.  - Complete abdominal US- No stones seen.  - HIDA- No evidence for biliary obstruction, Patent cystic duct without evidence for acute cholecystitis.  #Abdominal pain with nausea: resolved, requesting regular diet. His abdominal pain with nausea is a chronic issue that is likely gastroparesis. Abdominal US and HIDA negative for obstruction. - consider outpatient gastric emptying study, if etiology not identified. - zofran 4 mg q8hprn - GI consulted, recs appreciated, have signed off.  #DM1:  Improved CBGs.  Last hemoglobin A1c was 6.8 on 01/30/2013. As noted above, CIR changed his insulin regimen to 70/30 10 u bid. He is also on gabapentin 300 mg tid.  -SSI moderate with HS coverage.  -hold 70/30 for now -cont gabapentin 300 mg tid - lisinopril 10mg    #HTN: Bp elevated-  - on amlodipine 10 mg daily, carvedilol  25mg  BID, lisinopril 10mg  daily and cont lasix 40 mg daily - due to recent addition of lisinopril and increase in coreg up to 25mg  BID will monitor and increase lisinopril tomorrow if needed  #CKD stage 3: Mr Drennan's presenting creatinine is 1.72 with GFR 57. This is improved from baseline creatinine ~ 2.5 on previous admission. Kidney disease likely secondary to diabetes and HTN. UA had >300 protein. -will continue to monitor -  Discharge notes- 12/26/2013- Pt ACE-inh was held as there was acute increase in his Cr, this was supposed to be followed up on discharge but pt has not followed up yet since discharge from [facility]. - Cr has trended down gradually to his baseline. Restaredt Lisinopril at low dose- 10mg  daily as Bp is elevated. Creatinine down to 1.39 from 1.69 yesterday.   #HLD: Last lipid profile 02/19/13 with cholesterol 258, triglyceride 329, HDL 45, LDL 147. He is on lovastatin 20 mg daily. -pravastatin 20 mg po daily  #Mood  disorder: Mr Gahan was started on lexapro 20 mg qhs and Xanax 0.25 mg bidprn while in CIR for mood stabilization. He is also on nortripyline 10 mg po qhs. -cont lexapro 20 mg qhs, nortriptyline 10 mg qhs -hold xanax 0.25 mg bidprn (last used reportedly 01/20/14)  #GERD: He is on protonix 40 mg po bid and ranitidine 300 mg qhs at home. Ranitidine can increase INR -hold ranitidine 300 mg qhs -cont protonix 40 mg bid, Per Gi, can reduce to once daily on follow up in clinic.  #Diet: carb mod #DVT PPx: SCDs for now.  #Code: full  Dispo: Disposition is deferred at this time, awaiting improvement of current medical problems. Pending SNF placement availability   LOS: 5 days   Julious Oka, MD 02/03/2014, 11:09 AM

## 2014-02-03 NOTE — Progress Notes (Signed)
Subjective: Pt is now several weeks s/p R BKA.  He is admitted for anemia.  He has had some drainage from the R BKA stump.  Pt has not followed up with me yet in the office.  He denies any pain at the stump.    Objective: Vital signs in last 24 hours: Temp:  [98.2 F (36.8 C)-98.9 F (37.2 C)] 98.9 F (37.2 C) (01/26 1000) Pulse Rate:  [88-96] 88 (01/26 1000) Resp:  [18-20] 18 (01/26 1000) BP: (146-161)/(79-85) 146/79 mmHg (01/26 1000) SpO2:  [95 %-100 %] 100 % (01/26 1000) Weight:  [153.9 kg (339 lb 4.6 oz)] 153.9 kg (339 lb 4.6 oz) (01/26 0515)  Intake/Output from previous day: 01/25 0701 - 01/26 0700 In: 720 [P.O.:720] Out: -  Intake/Output this shift: Total I/O In: 240 [P.O.:240] Out: -    Recent Labs  01/31/14 1540 02/01/14 0310 02/02/14 0520 02/03/14 0545  HGB 8.8* 8.6* 9.1* 8.8*    Recent Labs  02/02/14 0520 02/03/14 0545  WBC 11.9* 10.0  RBC 3.35* 3.16*  HCT 27.3* 26.3*  PLT 572* 486*    Recent Labs  02/02/14 0928  NA 134*  K 3.5  CL 102  CO2 22  BUN 16  CREATININE 1.39*  GLUCOSE 177*  CALCIUM 8.2*    Recent Labs  02/02/14 1805 02/03/14 1006  INR 4.95* 5.45*    PE:  obese male in nad.  R LE bka stump with staples in place.  Scant serous drainage.  No purulence.  Superficial necrosis over the distal lateral aspect of the stump.  no cellulitis.  Assessment/Plan: R LE s/p BKA - staples may be removed.  Pt needs to be evaluated at the wound center for continuing treatment of this wound.  I'll see him back in the office as scheduled on the 12th of February.   [name] 02/03/2014, 1:19 PM

## 2014-02-03 NOTE — Progress Notes (Signed)
ANTICOAGULATION CONSULT NOTE - Initial Consult  Pharmacy Consult for warfarin  Indication: history of chronic dvt and pe  No Known Allergies  Patient Measurements: Height: 6\' 2"  (188 cm) Weight: (!) 339 lb 4.6 oz (153.9 kg) IBW/kg (Calculated) : 82.2  Vital Signs: Temp: 98.9 F (37.2 C) (01/26 1000) Temp Source: Oral (01/26 1000) BP: 146/79 mmHg (01/26 1000) Pulse Rate: 88 (01/26 1000)  Labs:  Recent Labs  02/01/14 0310 02/02/14 0520 02/02/14 0928 02/02/14 1805 02/03/14 0545 02/03/14 1006  HGB 8.6* 9.1*  --   --  8.8*  --   HCT 26.7* 27.3*  --   --  26.3*  --   PLT 590* 572*  --   --  486*  --   LABPROT 32.8*  --  44.4* 46.4*  --  50.0*  INR 3.18*  --  4.68* 4.95*  --  5.45*  CREATININE  --   --  1.39*  --   --   --     Estimated Creatinine Clearance: 114.1 mL/min (by C-G formula based on Cr of 1.39).   Medical History: Past Medical History  Diagnosis Date  . DVT (deep venous thrombosis) 09/2002    patient reports additional DVTs in '06 & '11 (unconfirmed)  . Pulmonary embolism 09/2002    treated with 6 months of warfarin  . Hypertension   . Chest pain, neg MI, normal coronaries by cath 02/18/2013  . Hyperlipidemia 02/19/2013  . Acute venous embolism and thrombosis of deep vessels of proximal lower extremity 07/19/2011  . Type I diabetes mellitus dx'd 2001  . GERD (gastroesophageal reflux disease)   . Diabetic ulcer of right foot   . CKD (chronic kidney disease) stage 3, GFR 30-59 ml/min 02/19/2013  . Nephrotic syndrome   . Obesity     BMI 44, weight 346 pounds 01/30/14   Assessment: [name] on chronic coumadin for recurrent DVT/PE, INR supratherapeutic at 6.8 on admission, which was reversed with 5mg  vitamin K PO. INR down to 2.7 afterwards, but now bounced back to 5.45 this morning. No coumadin since admission. Hgb 8.8, stable after transfusion, plt wnl, FOBT neg 1/24.   Reviewed coumadin dosing record during previous admission in Rehab, INR was fluctuating  greatly, at one point required only 0.5 - 1mg  /day then needed to increase dose to ~7.5mg /day to keep INR in therapeutic range. PO intake could have been affecting coumadin metabolism and dosing, not on medications interact with coumadin recently. Patient was taking 7.5mg  of coumadin daily prior to this admission, but issues with monitoring compliance noted. He was scheduled to have his INR check this coming week so no recent anticoag visit is present.  Eliquis 5mg  BID could be a good alternative for him if insurance is not an issue.   No active bleeding noted  Goal of Therapy:  INR 2-3 Monitor platelets by anticoagulation protocol: Yes   Plan:  Hold warfarin for now Daily INR  Maryanna Shape, PharmD, BCPS  Clinical Pharmacist  Pager: [phone]  02/03/2014 12:24 PM

## 2014-02-03 NOTE — Progress Notes (Signed)
CRITICAL VALUE ALERT  Critical value received: INR 5.45  Date of notification:  02/03/14  Time of notification:  [id number]  Critical value read back:Yes.    Nurse who received alert:  Jabier Mutton  MD notified (1st page):  Dr. Hulen Luster  Time of first page:  1125  Responding MD:  Dr. Hulen Luster  Time MD responded:  1127.

## 2014-02-03 NOTE — Clinical Social Work Note (Addendum)
1:00pm [facility] was able to make bed offer on patient- patient is agreeable to SNF placement for rehab.  [facility] is unable to accept patient until tomorrow- are waiting on delivery of a bariatric bed.  CSW has spoken with [facility]- they are likely able to provide transport for patient tomorrow but are unsure about availability of wheelchair vans.- transport pending.  11:00am [facility] is unable to accept patient.  CSW supervisor is speaking with other facilities concerning placement.  CSW will continue to follow.  Domenica Reamer, [place] Social Worker [phone]

## 2014-02-03 NOTE — Progress Notes (Signed)
UR completed 

## 2014-02-04 LAB — GLUCOSE, CAPILLARY
GLUCOSE-CAPILLARY: 213 mg/dL — AB (ref 70–99)
Glucose-Capillary: 185 mg/dL — ABNORMAL HIGH (ref 70–99)

## 2014-02-04 LAB — PT FACTOR INHIBITOR (MIXING STUDY)
PATIENT-1/1, INCUBATED MIX-PT: 12.7 s
POSTINC11: 12.9 s
PROTIME: 63 s — AB (ref 8.9–12.1)
PT MIXING INTERP: NOT DETECTED
PTPOSTINC: 54.8 s
Patient-1/1, Immediate Mix-PT: 12.6 seconds

## 2014-02-04 LAB — CBC
HCT: 27.1 % — ABNORMAL LOW (ref 39.0–52.0)
HEMOGLOBIN: 8.7 g/dL — AB (ref 13.0–17.0)
MCH: 26.6 pg (ref 26.0–34.0)
MCHC: 32.1 g/dL (ref 30.0–36.0)
MCV: 82.9 fL (ref 78.0–100.0)
PLATELETS: 543 10*3/uL — AB (ref 150–400)
RBC: 3.27 MIL/uL — AB (ref 4.22–5.81)
RDW: 14.6 % (ref 11.5–15.5)
WBC: 11.7 10*3/uL — ABNORMAL HIGH (ref 4.0–10.5)

## 2014-02-04 LAB — PROTIME-INR
INR: 1.6 — AB (ref 0.00–1.49)
Prothrombin Time: 19.2 seconds — ABNORMAL HIGH (ref 11.6–15.2)

## 2014-02-04 MED ORDER — GABAPENTIN 300 MG PO CAPS: 300.0000 mg | ORAL_CAPSULE | Freq: Three times a day (TID) | ORAL | Status: DC

## 2014-02-04 MED ORDER — APIXABAN 5 MG PO TABS: 5.0000 mg | ORAL_TABLET | Freq: Two times a day (BID) | ORAL | Status: DC

## 2014-02-04 MED ORDER — FUROSEMIDE 40 MG PO TABS: 40.0000 mg | ORAL_TABLET | Freq: Every day | ORAL | Status: DC

## 2014-02-04 MED ORDER — AMLODIPINE BESYLATE 10 MG PO TABS: 10.0000 mg | ORAL_TABLET | Freq: Every day | ORAL | Status: DC

## 2014-02-04 MED ORDER — LOVASTATIN 20 MG PO TABS: 20.0000 mg | ORAL_TABLET | Freq: Every day | ORAL | Status: DC

## 2014-02-04 MED ORDER — INSULIN NPH ISOPHANE & REGULAR (70-30) 100 UNIT/ML ~~LOC~~ SUSP: 10.0000 [IU] | Freq: Two times a day (BID) | SUBCUTANEOUS | Status: DC

## 2014-02-04 MED ORDER — ADULT MULTIVITAMIN W/MINERALS CH: 1.0000 | ORAL_TABLET | Freq: Every day | ORAL | Status: DC

## 2014-02-04 MED ORDER — OXYCODONE HCL 15 MG PO TABS: 15.0000 mg | ORAL_TABLET | Freq: Four times a day (QID) | ORAL | Status: DC | PRN

## 2014-02-04 MED ORDER — CALCIUM CARBONATE ANTACID 500 MG PO CHEW: 1.0000 | CHEWABLE_TABLET | Freq: Three times a day (TID) | ORAL | Status: DC

## 2014-02-04 MED ORDER — ESCITALOPRAM OXALATE 20 MG PO TABS: 20.0000 mg | ORAL_TABLET | Freq: Every day | ORAL | Status: DC

## 2014-02-04 MED ORDER — TAMSULOSIN HCL 0.4 MG PO CAPS: 0.4000 mg | ORAL_CAPSULE | Freq: Every day | ORAL | Status: DC

## 2014-02-04 MED ORDER — METHOCARBAMOL 500 MG PO TABS: 500.0000 mg | ORAL_TABLET | Freq: Four times a day (QID) | ORAL | Status: DC | PRN

## 2014-02-04 MED ORDER — SENNOSIDES-DOCUSATE SODIUM 8.6-50 MG PO TABS: 2.0000 | ORAL_TABLET | Freq: Two times a day (BID) | ORAL | Status: DC

## 2014-02-04 MED ORDER — NORTRIPTYLINE HCL 10 MG PO CAPS: 10.0000 mg | ORAL_CAPSULE | Freq: Every day | ORAL | Status: DC

## 2014-02-04 MED ORDER — CARVEDILOL 25 MG PO TABS: 25.0000 mg | ORAL_TABLET | Freq: Two times a day (BID) | ORAL | Status: DC

## 2014-02-04 MED ORDER — PANTOPRAZOLE SODIUM 40 MG PO TBEC: 40.0000 mg | DELAYED_RELEASE_TABLET | Freq: Two times a day (BID) | ORAL | Status: DC

## 2014-02-04 MED ORDER — LISINOPRIL 10 MG PO TABS: 10.0000 mg | ORAL_TABLET | Freq: Every day | ORAL | Status: DC

## 2014-02-04 NOTE — Clinical Social Work Note (Signed)
Patient will discharge to [facility] Anticipated discharge date: 02/04/14 Family notified: girlfriend at bedside Transportation by [place] signing off.  Domenica Reamer, [place] Social Worker [phone]

## 2014-02-04 NOTE — Discharge Summary (Signed)
Name: [name] MRN: [id number] DOB: 1976/01/05 39 y.o. PCP: Lucious Groves, DO  Date of Admission: 01/29/2014 10:43 PM Date of Discharge: 02/04/2014 Attending Physician: Axel Filler, MD  Discharge Diagnosis:   Normocytic anemia   Abdominal pain, nausea, vomiting- resolved   S/P Amputation of right lower extremity below knee   DM (diabetes mellitus) type I uncontrolled, periph vascular disorder   Long term current use of anticoagulant therapy due to hx of DVTs   Supratherapeutic INR, corrected   Essential hypertension   Hyperlipidemia   CKD stage 3 due to type 1 diabetes mellitus       Discharge Medications:   Medication List    STOP taking these medications        levofloxacin 250 MG tablet  Commonly known as:  LEVAQUIN     ranitidine 300 MG tablet  Commonly known as:  ZANTAC     warfarin 5 MG tablet  Commonly known as:  COUMADIN      TAKE these medications        acetaminophen 325 MG tablet  Commonly known as:  TYLENOL  Take 1-2 tablets (325-650 mg total) by mouth every 4 (four) hours as needed for mild pain.     ALPRAZolam 0.25 MG tablet  Commonly known as:  XANAX  Take 1 tablet (0.25 mg total) by mouth 2 (two) times daily as needed for anxiety.     amLODipine 10 MG tablet  Commonly known as:  NORVASC  Take 1 tablet (10 mg total) by mouth daily.     apixaban 5 MG Tabs tablet  Commonly known as:  ELIQUIS  Take 1 tablet (5 mg total) by mouth 2 (two) times daily.     calcium carbonate 500 MG chewable tablet  Commonly known as:  TUMS - dosed in mg elemental calcium  Chew 1 tablet (200 mg of elemental calcium total) by mouth 3 (three) times daily with meals.     carvedilol 25 MG tablet  Commonly known as:  COREG  Take 1 tablet (25 mg total) by mouth 2 (two) times daily with a meal.     escitalopram 20 MG tablet  Commonly known as:  LEXAPRO  Take 1 tablet (20 mg total) by mouth at bedtime.     furosemide 40 MG tablet  Commonly known as:   LASIX  Take 1 tablet (40 mg total) by mouth daily.     gabapentin 300 MG capsule  Commonly known as:  NEURONTIN  Take 1 capsule (300 mg total) by mouth 3 (three) times daily.     insulin NPH-regular Human (70-30) 100 UNIT/ML injection  Commonly known as:  NOVOLIN 70/30  Inject 10 Units into the skin 2 (two) times daily with a meal.     lisinopril 10 MG tablet  Commonly known as:  PRINIVIL,ZESTRIL  Take 1 tablet (10 mg total) by mouth daily.     lovastatin 20 MG tablet  Commonly known as:  MEVACOR  Take 1 tablet (20 mg total) by mouth daily.     methocarbamol 500 MG tablet  Commonly known as:  ROBAXIN  Take 1 tablet (500 mg total) by mouth every 6 (six) hours as needed for muscle spasms.     multivitamin with minerals Tabs tablet  Take 1 tablet by mouth daily.     nortriptyline 10 MG capsule  Commonly known as:  PAMELOR  Take 1 capsule (10 mg total) by mouth at bedtime.     oxyCODONE 15  MG immediate release tablet  Commonly known as:  ROXICODONE  Take 1 tablet (15 mg total) by mouth every 6 (six) hours as needed.     pantoprazole 40 MG tablet  Commonly known as:  PROTONIX  Take 1 tablet (40 mg total) by mouth 2 (two) times daily.     senna-docusate 8.6-50 MG per tablet  Commonly known as:  Senokot-S  Take 2 tablets by mouth 2 (two) times daily.     tamsulosin 0.4 MG Caps capsule  Commonly known as:  FLOMAX  Take 1 capsule (0.4 mg total) by mouth daily after supper.        Disposition and follow-up:   Mr.Hodari Mackler was discharged from [facility] in Stable condition.  At the hospital follow up visit please address:  1.  Please ensure pt had appropriate wound care for his right BKA.   2.  Labs / imaging needed at time of follow-up: INR check in one week. See INR below.   3.  Pending labs/ test needing follow-up: PT factor mixing study and Factor 7 assay  4. Please arrange nephrology follow up once pt leaves SNF.  Follow-up  Appointments: Follow-up Information    Follow up with [name], MD On 02/20/2014.   Specialty:  Orthopedic Surgery   Contact information:   [address] [place] [address] [id number]         Consultations: Treatment Team:  [name], MD w/ ortho and Dr. Henrene Pastor w/ GI Procedures Performed:  Dg Chest 2 View  01/30/2014   CLINICAL DATA:  Oxygen desaturation  EXAM: CHEST  2 VIEW  COMPARISON:  01/20/2014  FINDINGS: Marked hypo aeration, with interstitial crowding. No edema or pneumonia is suspected. Heart size is normal. Negative aortic and hilar contours.  IMPRESSION: Marked hypoventilation.   Electronically Signed   By: Jorje Guild M.D.   On: 01/30/2014 05:13   Dg Chest 2 View  01/20/2014   CLINICAL DATA:  Cough, congestion and shortness of breath for a few days. Lower extremity amputation last month.  EXAM: CHEST  2 VIEW  COMPARISON:  01/16/2014  FINDINGS: Right-sided PICC line unchanged with tip obliquely oriented in the midline and not seen on the lateral film as positioning is not specific as this could be arterial or venous in location. Lungs are hypoinflated without definite focal consolidation or effusion. Cardiomediastinal silhouette and remainder of the exam is unchanged.  IMPRESSION: No definite acute cardiopulmonary disease.  Right-sided PICC line unchanged with tip obliquely oriented in the midline as positioning is nonspecific and this could be venous or arterial in location.  These results were called by telephone at the time of interpretation on 01/20/2014 at 9:27 am to patient's nurse, Dwaine Gale, who verbally acknowledged these results.   Electronically Signed   By: Marin Olp M.D.   On: 01/20/2014 09:28   Dg Chest 2 View  01/16/2014   CLINICAL DATA:  Shortness of breath, cough  EXAM: CHEST  2 VIEW  COMPARISON:  12/16/2013  FINDINGS: Low lung volumes. Mild patchy left lower lobe opacity, suspicious for pneumonia. No pleural effusion or  pneumothorax.  Mild cardiomegaly.  Mild degenerative changes of the visualized thoracolumbar spine.  IMPRESSION: Mild patchy left upper lobe opacity, suspicious for pneumonia.   Electronically Signed   By: Julian Hy M.D.   On: 01/16/2014 22:03   Dg Knee 1-2 Views Right  01/30/2014   CLINICAL DATA:  Status post recent RIGHT below-knee amputation, fall,  assess for fracture or osteomyelitis as there is wound tenderness and drainage.  EXAM: RIGHT KNEE - 1-2 VIEW  COMPARISON:  None.  FINDINGS: Status post below-knee amputation, No disc directed bony lesions. No fracture deformity. No dislocation. No subcutaneous gas or radiopaque foreign bodies. Skin staples at the stump.  IMPRESSION: No acute fracture deformity nor dislocation. Status post below-knee amputation without radiographic findings of osteomyelitis.   Electronically Signed   By: Elon Alas   On: 01/30/2014 05:13   Nm Hepatobiliary Liver Func  02/01/2014   CLINICAL DATA:  Abdominal pain, nausea and elevated alkaline phosphatase.  EXAM: NUCLEAR MEDICINE HEPATOBILIARY IMAGING  TECHNIQUE: Sequential images of the abdomen were obtained out to 60 minutes following intravenous administration of radiopharmaceutical.  RADIOPHARMACEUTICALS:  5.0 Mill[employer]ie 123XX123 Choletec  COMPARISON:  None.  FINDINGS: Following the IV administration of the radiopharmaceutical there is uniform tracer uptake by the liver with clearance from blood pool. Tracer accumulation within the gallbladder is identified by 25 min. The common bile duct is patent and small bowel activity is identified at 20 min.  IMPRESSION: 1. No evidence for biliary obstruction. 2. Patent cystic duct without evidence for acute cholecystitis.   Electronically Signed   By: Kerby Moors M.D.   On: 02/01/2014 09:40   US Abdomen Complete  01/31/2014   CLINICAL DATA:  Elevated liver enzymes. Epigastric pain. Persistent hematuria.  EXAM: ULTRASOUND ABDOMEN COMPLETE  COMPARISON:  12/21/2013   FINDINGS: Gallbladder: Gallbladder sludge. No wall thickening or focal tenderness.  Common bile duct: Diameter: 4 mm  Liver: There is a 12 mm right lobe mass which is echogenic and without visible color Doppler flow. This is most likely a hemangioma.  IVC: Limited imaging negative  Pancreas: Not visible  Spleen: Size and appearance within normal limits.  Right Kidney: Length: 13 cm, similar to abdominal CT 12/19/2013. Enlarged and echogenic. No hydronephrosis.  Left Kidney: Length: 13 cm, similar to abdominal CT. Enlarged echogenic. No hydronephrosis. The enlargement echogenicity of the bilateral kidneys is likely from diabetic nephropathy. Renal vein thrombosis is considered unlikely given symmetry, lack of definite enlargement since previous CT, and recently improved creatinine.  Abdominal aorta: No aneurysm visualized.  Other findings: None.  IMPRESSION: 1. Gallbladder sludge.  No evidence of cholecystitis. 2. Large and echogenic bilateral kidneys as seen with diabetic nephropathy. 3. 12 mm mass in the right liver, likely hemangioma.   Electronically Signed   By: Jorje Guild M.D.   On: 01/31/2014 22:46     Admission HPI: Mr Wackerman is a 39 year old man with DM1, previous DVTs on warfarin, HTN, CKD3, HL s/p R BKA 2/2 wet gangrene 12/22/13. Mr Prajapati complains of one day nausea, non-bloody emesis, and dizziness. He had a diffuse abdominal pain he described as sharp. His nausea, emesis, and abdominal pain had resolved when he was interviewed and examined. He also reports that he fell on his stub trying to get onto EMS stretcher. He also says he has soreness, swelling, and drainage of the surgical site. In the ED, he was noted to have an INR of 6.84 and hemoglobin 7.7 down from 9.4 two days ago. He also noted a dark appearance to his stool today on further questioning. He denied any fevers, chills, night sweats, chest pain, cough, diarrhea, constipation, dysuria. He received morphine 4 mg iv once, Zofran 4 mg  po and 4 mg iv.  Of note, he originally presented on 12/09/13 with DKA. During that admission, he underwent R BKA 2/2 wet  gangrene by Dr. [name] after multiple irrigation and debridement surgeries for his R forefoot abscesses with progression despite these interventions and intravenous antibiotics. He also had I&D of R buttock abscess and given two additional weeks of vancomycin. He tolerated the procedure well with approximately 450 cc blood lose and no complications. He was discharged to [facility] on 12/18 and remained there until 01/20/14.   While at [facility], he had Lexapro added for mood stabilization. He had several hypoglycemic episodes and his lantus was discontinued and low dose 70/30 was started and titrated to 10 u bid. He completed his antibiotic regimen. Reactive leukocytosis resolved and the BKA appeared to be healing well with staples intact and no signs or symptoms of infection. He was transfused with 2 U PRBC on 1/13 for persistent anemia with a drop in hemoglobin to 6.8 that then improved to 9.4. He developed bronchitis before discharge and was started on zosyn 1/8 but he was discharged on Levaquin to complete a one week course of antibiotic. He also had low dose Elavil added to augment his Neurontin for neuropathic pain. He was discharged with home health RN.   Hospital Course by problem list: Normocytic anemia   Abdominal pain, nausea, vomiting- resolved   S/P Amputation of right lower extremity below knee   DM (diabetes mellitus) type I uncontrolled, periph vascular disorder   Long term current use of anticoagulant therapy due to hx of DVTs   Supratherapeutic INR, corrected   Essential hypertension   Hyperlipidemia   CKD stage 3 due to type 1 diabetes mellitus  Chronic Anticoagulation, hx of recurrent DVT/PE-- admission INR was 6.84. Pt given dose of Vit K 5mg  po next day of admission with resultant decrease in INR. Coumadin was held since admission and on 1/23 INR started steadily  increasing to 5.45 on 1/26. Likely the first dose of Vit K 5mg  given was not enough and the transient effect of Vit K wore off causing the INR to increase. Another dose of Vit K 10mg  on 1/26 and INR decreased to 1.60. Pt started on eliquis 5mg  BID as INR has been difficult to control. Also home ranitidine 300mg  qhs was stopped as it can increase INR. Factor 7 and mixing study pending.   Anemia-- admission Hgb 7.7 and was previously 9.4 twelve days prior to admission. Pt given 2uRBCs on admission. Post transfusion hgb Pt had FOBT neg stools. GI saw patient, believe pt has anemia of chronic disease related to kidneys. Hemoglobin remained stable throughout admission and was 8.7 day of discharge.  Right BKA-- Pt had rt BKA done by Dr. Doran Durand in 12/2013. Rt stump xray neg for fractures or osteomyelitis. Dr. Doran Durand saw patient in the hospital and recommended to have suture staples removed and to follow up with wound care clinic and f/u in his office on 02/20/14.  Recommended daily dressings and PRN.  Per case management SNF has wound care specialty care at their center to help with this. Recommended daily dressings and PRN.   Nausea/vomiting/abdominal pain--His abdominal pain with nausea is a chronic issue that is likely gastroparesis. Lipase normal, LFTs normal other than alkaline phosphatase 177 which is actually decreased from 204 on 12/29/13. He had a previous CT abd pelvis for similar symptoms which found no acute findings but possible sludge versus stones in the gallbladder fundus. His symptoms resolved with zofran and morphine in the ED. GI saw pt and recommended PPI BID, can reduce to once daily when pt follows up in clinic. HIDA  scan neg for biliary obstruction. Abdominal u/s neg for stones. N/V and abdominal pain resolved and pt tolerating carb mod diet.   Diabetes--While he has DM1, he likely has a component of insulin resistance. Presenting CBG of 229 with 250 glucose in UA. He has no anion gap (8) or  acidosis on CMP. Last hemoglobin A1c was 12.8 on 08/14/13. As noted above, [facility] changed his insulin regimen to 70/30 10 u bid. He is also on gabapentin 300 mg tid. Pt placed on sliding scale during admission, please considering starting basal dose insulin. A1c during admission on 1/22 was 6.8.   HTN-- adjusted HTN medications as follows: norvasc 10mg  daily, coreg 25mg  BID, lasix 40mg  daily, and lisinopril 10mg  daily.  Discharge Vitals:   BP 131/69 mmHg  Pulse 98  Temp(Src) 98.6 F (37 C) (Oral)  Resp 18  Ht 6\' 2"  (1.88 m)  Wt 335 lb 5.1 oz (152.1 kg)  BMI 43.03 kg/m2  SpO2 100%  Discharge Labs:  Results for orders placed or performed during the hospital encounter of 01/29/14 (from the past 24 hour(s))  Glucose, capillary     Status: Abnormal   Collection Time: 02/03/14  4:07 PM  Result Value Ref Range   Glucose-Capillary 251 (H) 70 - 99 mg/dL  Glucose, capillary     Status: Abnormal   Collection Time: 02/03/14  9:05 PM  Result Value Ref Range   Glucose-Capillary 110 (H) 70 - 99 mg/dL  CBC     Status: Abnormal   Collection Time: 02/04/14  5:55 AM  Result Value Ref Range   WBC 11.7 (H) 4.0 - 10.5 K/uL   RBC 3.27 (L) 4.22 - 5.81 MIL/uL   Hemoglobin 8.7 (L) 13.0 - 17.0 g/dL   HCT 27.1 (L) 39.0 - 52.0 %   MCV 82.9 78.0 - 100.0 fL   MCH 26.6 26.0 - 34.0 pg   MCHC 32.1 30.0 - 36.0 g/dL   RDW 14.6 11.5 - 15.5 %   Platelets 543 (H) 150 - 400 K/uL  Protime-INR     Status: Abnormal   Collection Time: 02/04/14  5:55 AM  Result Value Ref Range   Prothrombin Time 19.2 (H) 11.6 - 15.2 seconds   INR 1.60 (H) 0.00 - 1.49  Glucose, capillary     Status: Abnormal   Collection Time: 02/04/14  6:16 AM  Result Value Ref Range   Glucose-Capillary 213 (H) 70 - 99 mg/dL  Glucose, capillary     Status: Abnormal   Collection Time: 02/04/14 11:16 AM  Result Value Ref Range   Glucose-Capillary 185 (H) 70 - 99 mg/dL    Signed: Julious Oka, MD 02/04/2014, 12:47 PM    Services Ordered on  Discharge: none Equipment Ordered on Discharge: none

## 2014-02-04 NOTE — Progress Notes (Signed)
ANTICOAGULATION CONSULT NOTE - Initial Consult  Pharmacy Consult for warfarin  Indication: history of chronic dvt and pe  No Known Allergies  Patient Measurements: Height: 6\' 2"  (188 cm) Weight: (!) 335 lb 5.1 oz (152.1 kg) IBW/kg (Calculated) : 82.2  Vital Signs: Temp: 98.6 F (37 C) (01/27 0824) Temp Source: Oral (01/27 0824) BP: 131/69 mmHg (01/27 0824) Pulse Rate: 98 (01/27 0824)  Labs:  Recent Labs  02/02/14 0520  02/02/14 0928 02/02/14 1805 02/03/14 0545 02/03/14 1006 02/04/14 0555  HGB 9.1*  --   --   --  8.8*  --  8.7*  HCT 27.3*  --   --   --  26.3*  --  27.1*  PLT 572*  --   --   --  486*  --  543*  LABPROT  --   < > 44.4* 46.4*  --  50.0* 19.2*  INR  --   < > 4.68* 4.95*  --  5.45* 1.60*  CREATININE  --   --  1.39*  --   --   --   --   < > = values in this interval not displayed.  Estimated Creatinine Clearance: 113.4 mL/min (by C-G formula based on Cr of 1.39).   Medical History: Past Medical History  Diagnosis Date  . DVT (deep venous thrombosis) 09/2002    patient reports additional DVTs in '06 & '11 (unconfirmed)  . Pulmonary embolism 09/2002    treated with 6 months of warfarin  . Hypertension   . Chest pain, neg MI, normal coronaries by cath 02/18/2013  . Hyperlipidemia 02/19/2013  . Acute venous embolism and thrombosis of deep vessels of proximal lower extremity 07/19/2011  . Type I diabetes mellitus dx'd 2001  . GERD (gastroesophageal reflux disease)   . Diabetic ulcer of right foot   . CKD (chronic kidney disease) stage 3, GFR 30-59 ml/min 02/19/2013  . Nephrotic syndrome   . Obesity     BMI 44, weight 346 pounds 01/30/14   Assessment: 38-YOM on chronic coumadin for recurrent DVT/PE, INR supratherapeutic at 6.8 on admission, which was reversed with 5mg  vitamin K PO. INR down to 2.7 afterwards, but now bounced back to 5.45 1/26. No coumadin since admission. Hgb 8.7, stable after transfusion, plt wnl, FOBT neg 1/24. Gave another dose Vitamin K  10mg  PO on 1/26, then INR 1.6, plan for discharge today and switch to Eliquis after discharge. Resident says will recheck INR at SNF in case INR is up again.   Reviewed coumadin dosing record during previous admission in Rehab, INR was fluctuating greatly, at one point required only 0.5 - 1mg  /day then needed to increase dose to ~7.5mg /day to keep INR in therapeutic range. PO intake could have been affecting coumadin metabolism and dosing, not on medications interact with coumadin recently. Patient was taking 7.5mg  of coumadin daily prior to this admission, but issues with monitoring compliance noted. He was scheduled to have his INR check this coming week so no recent anticoag visit is present.  No active bleeding noted  Goal of Therapy:  INR 2-3 Monitor platelets by anticoagulation protocol: Yes   Plan:  Plan:  No more coumadin F/u INR if pt. Is still here. Eliquis 5mg  BID if INR is not trending up again.   Maryanna Shape, PharmD, BCPS  Clinical Pharmacist  Pager: [phone]  02/04/2014 1:29 PM

## 2014-02-04 NOTE — Progress Notes (Signed)
Report called of to [facility] at [facility] in [place]. She denies any questions. Francis Gaines Jaycelynn Knickerbocker RN.

## 2014-02-04 NOTE — Progress Notes (Signed)
Pt staples to right BKA removed as ordered; pt IV and telemetry removed; new dsg applied to pt right BKA stump; pt changed into his personal clothing. Pt discharge to [facility] in [place]. Pt transported off unit via wheelchair with girlfriend and belongings to the side. Pt picked up the SNF facility transporter. Francis Gaines Marks Scalera RN.

## 2014-02-04 NOTE — Clinical Social Work Note (Signed)
Patient transport set up for 1pm to go to [place] [facility]- DC summary requested by facility as soon as possible.  CSW spoke with Dr who is concerned about INR levels- DC pending   CSW will continue to follow.  Domenica Reamer, [place] Social Worker [phone]

## 2014-02-04 NOTE — Progress Notes (Signed)
Subjective: Pt denies nausea, tolerating breakfast. States he is ready to go home. Educated on eliquis.   Objective: Vital signs in last 24 hours: Filed Vitals:   02/03/14 1421 02/03/14 2018 02/04/14 0430 02/04/14 0824  BP: 116/63 112/67 133/76 131/69  Pulse: 89 88 92 98  Temp: 98 F (36.7 C) 98.1 F (36.7 C) 98.2 F (36.8 C) 98.6 F (37 C)  TempSrc: Oral Oral Oral Oral  Resp: 20 18 18 18   Height:      Weight:   335 lb 5.1 oz (152.1 kg)   SpO2: 100% 97% 100% 100%   Weight change: -3 lb 15.5 oz (-1.8 kg)  Intake/Output Summary (Last 24 hours) at 02/04/14 1247 Last data filed at 02/04/14 1019  Gross per 24 hour  Intake    600 ml  Output   1400 ml  Net   -800 ml   Vitals reviewed. General: NAD, alert, in no distress. Lungs: CTAB, no wheezes Cardiac: RRR, no murmurs GI: soft, obese, no tenderness epigastric area, normoactive bowel sounds Ext: rt BKA with stump shrinker wrap on Neuro: No obvious deficits.  Lab Results: Basic Metabolic Panel:  Recent Labs Lab 01/30/14 1355 02/02/14 0928  NA 137 134*  K 4.3 3.5  CL 105 102  CO2 25 22  GLUCOSE 205* 177*  BUN 20 16  CREATININE 1.69* 1.39*  CALCIUM 8.6 8.2*   Liver Function Tests:  Recent Labs Lab 01/29/14 2313 01/30/14 1355  AST 17 15  ALT 23 20  ALKPHOS 177* 162*  BILITOT 0.5 0.5  PROT 7.2 6.9  ALBUMIN 2.0* 1.8*    Recent Labs Lab 01/29/14 2313  LIPASE 39   CBC:  Recent Labs Lab 01/29/14 2313  02/03/14 0545 02/04/14 0555  WBC 12.9*  < > 10.0 11.7*  NEUTROABS 9.2*  --   --   --   HGB 7.7*  < > 8.8* 8.7*  HCT 24.0*  < > 26.3* 27.1*  MCV 82.5  < > 83.2 82.9  PLT 701*  < > 486* 543*  < > = values in this interval not displayed. CBG:  Recent Labs Lab 02/03/14 0610 02/03/14 1110 02/03/14 1607 02/03/14 2105 02/04/14 0616 02/04/14 1116  GLUCAP 160* 173* 251* 110* 213* 185*   Coagulation:  Recent Labs Lab 02/02/14 0928 02/02/14 1805 02/03/14 1006 02/04/14 0555  LABPROT 44.4*  46.4* 50.0* 19.2*  INR 4.68* 4.95* 5.45* 1.60*   Urinalysis:  Recent Labs Lab 01/30/14 0114  COLORURINE YELLOW  LABSPEC 1.013  PHURINE 7.0  GLUCOSEU 250*  HGBUR MODERATE*  BILIRUBINUR NEGATIVE  KETONESUR NEGATIVE  PROTEINUR >300*  UROBILINOGEN 0.2  NITRITE NEGATIVE  LEUKOCYTESUR NEGATIVE   Studies/Results: No results found. Medications: I have reviewed the patient's current medications. Scheduled Meds: . sodium chloride  10 mL/hr Intravenous Once  . amLODipine  10 mg Oral Daily  . calcium carbonate  1 tablet Oral TID WC  . carvedilol  25 mg Oral BID WC  . escitalopram  20 mg Oral QHS  . furosemide  40 mg Oral Daily  . gabapentin  300 mg Oral TID  . insulin aspart  0-15 Units Subcutaneous TID WC  . insulin aspart  0-5 Units Subcutaneous QHS  . lisinopril  10 mg Oral Daily  . multivitamin with minerals  1 tablet Oral Daily  . nortriptyline  10 mg Oral QHS  . pantoprazole  40 mg Oral BID  . pravastatin  20 mg Oral q1800  . senna-docusate  2 tablet Oral  QHS  . sodium chloride  3 mL Intravenous Q12H  . tamsulosin  0.4 mg Oral QPC supper  . Warfarin - Pharmacist Dosing Inpatient   Does not apply q1800   Continuous Infusions:   PRN Meds:.ondansetron, oxyCODONE Assessment/Plan:  #s/p R BKA:  plain film R knee neg for osteo or fractures -consulted ortho recs appreciated- Dr Doran Durand saw patient yesterday. Recommend wound care clinic for Rt BKA and to f/u with him in February -ensure ortho f/u on discharge -oxycodone 15 mg po q6hprn - PT eval- recs SNF - transfer to SNF today where wound care has been set up per social worker  #h/o DVT on coumadin: Per EMR, he had a DVT with PE in 2004 and a repeat DVT in 2011 and is now on chronic warfarin. -coumadin held since admission. Stopped and changed to eliquis 5mg  BID as difficult to control INR. -INR today 1.62 today down from 5.45 yesterday s/p vit K 10mg  po yesterday  #Anemia:  3 FOBT in 2 weeks- negative. Hgb 8.7 today. -  Consulted GI- doubt Gi bleed- as FOBT- neg - Complete US- As UA has shown persistent hgb- Large kidneys- related to Diabetic nephropathy. - Observe bowel movements for dark stools and blood - CBC daily w/  Goal hemoglobin- >8.  Elevated ALP- GGT on 12/20/13 elevated.  - Complete abdominal US- No stones seen.  - HIDA- No evidence for biliary obstruction, Patent cystic duct without evidence for acute cholecystitis.  #Abdominal pain with nausea: resolved, tolerating regular diet. His abdominal pain with nausea is a chronic issue that is likely gastroparesis. Abdominal US and HIDA negative for obstruction. - consider outpatient gastric emptying study, if etiology not identified. - zofran 4 mg q8hprn - GI consulted, recs appreciated, have signed off.  #DM1:  Improved CBGs.  Last hemoglobin A1c was 6.8 on 01/30/2013. As noted above, CIR changed his insulin regimen to 70/30 10 u bid. He is also on gabapentin 300 mg tid.  -SSI moderate with HS coverage.  -hold 70/30 for now -cont gabapentin 300 mg tid - lisinopril 10mg    #HTN: normotensive - on amlodipine 10 mg daily, carvedilol  25mg  BID, lisinopril 10mg  daily and cont lasix 40 mg daily  #CKD stage 3: Mr Thrush's presenting creatinine is 1.72 with GFR 57. This is improved from baseline creatinine ~ 2.5 on previous admission. Kidney disease likely secondary to diabetes and HTN. UA had >300 protein. - Discharge notes- 12/26/2013- Pt ACE-inh was held as there was acute increase in his Cr, this was supposed to be followed up on discharge but pt has not followed up yet since discharge from [facility]. - pt to follow up with [facility] in 1 month  #HLD: Last lipid profile 02/19/13 with cholesterol 258, triglyceride 329, HDL 45, LDL 147. He is on lovastatin 20 mg daily. -pravastatin 20 mg po daily  #Mood disorder: Mr Dacquisto was started on lexapro 20 mg qhs and Xanax 0.25 mg bidprn while in CIR for mood stabilization. He is also on nortripyline 10 mg po  qhs. -cont lexapro 20 mg qhs, nortriptyline 10 mg qhs -hold xanax 0.25 mg bidprn (last used reportedly 01/20/14)  #GERD: He is on protonix 40 mg po bid and ranitidine 300 mg qhs at home. Ranitidine can increase INR -stopped ranitidine 300 mg qhs -cont protonix 40 mg bid, Per Gi, can reduce to once daily on follow up in clinic.  #Diet: carb mod #DVT PPx: SCDs for now.  #Code: full  Dispo: d/c to SNF  today   LOS: 6 days   Julious Oka, MD 02/04/2014, 12:47 PM

## 2014-02-04 NOTE — Progress Notes (Signed)
   02/04/14 1400  Clinical Encounter Type  Visited With Patient and family together;Health care provider  Visit Type Follow-up;Spiritual support;Social support  Stress Factors  Patient Stress Factors Major life changes   Chaplain followed up with patient today. Patient was preparing to leave for a skilled nursing facility today. Patient was optimistic about meeting his goals and adjusting to life after the amputation of his leg. Patient believes being out of town will provide him with the solitude and focus he needs to adjust to everyday life. Patient said it was difficult explaining to his nephews that he will be out of town for a while working on himself and his health. Chaplain listened to patient and has agreed to provide additional support as the patient continues to adjust to his new life. Gar Ponto, Chaplain  2:29 PM

## 2014-02-04 NOTE — Progress Notes (Signed)
Physical Therapy Treatment Patient Details Name: [name] MRN: [id number] DOB: 02/01/1975 Today's Date: 02/04/2014    History of Present Illness Patient is a 39 year old man with history of type 1 diabetes mellitus, recurrent venous thromboembolism (DVT/PE), hypertension, hyperlipidemia, chronic kidney disease, status post right BKA by Dr. Doran Durand on 12/22/2013 due to right foot abscesses and wet gangrene, and other problems as outlined in the medical history, admitted with complaint of nausea/vomiting, intermittent abdominal pain, and fall at home with trauma to his amputation stump    PT Comments    Pt practiced transfers OOB today but with difficulty, required Clarise Cruz plus for back to bed from chair. Pt with low energy level and very flat affect, though verbally, he reports being ready to get his prosthesis and begin walking. Pt educated in safety measures for protecting RLE and the need for him to take an active part in this. PT will continue to follow.   Follow Up Recommendations  SNF     Equipment Recommendations  None recommended by PT    Recommendations for Other Services       Precautions / Restrictions Precautions Precautions: Fall Precaution Comments: New Rt BKA, with protector Required Braces or Orthoses: Knee Immobilizer - Right Knee Immobilizer - Right: On at all times Restrictions Weight Bearing Restrictions: Yes RLE Weight Bearing: Non weight bearing    Mobility  Bed Mobility Overal bed mobility: Modified Independent                Transfers Overall transfer level: Needs assistance Equipment used: Rolling walker (2 wheeled) Transfers: Sit to/from [employer] Sit to Stand: Mod assist;+2 physical assistance;+2 safety/equipment   Squat pivot transfers: +2 safety/equipment;Min assist     General transfer comment: practiced sit to stand from elevated bed to RW 2x, pt required +2 mod A for wt shift fwd and trunk elevation. Pt required  education of proper placement of left foot to be able to get wt onto LLE to stand. After this, squat pivot practiced from bed to chair with +2 mod A, pt able to use UE's to scoot bed to chair. Pt very fatigued after this. Pt educated on importance of transferring to left at this time to protect residual limb on right. In addition, pt lets right hip drop down durring transfer and cannot get wt bkwds to chair without mod A. Educated on proper placement and set up for transfers. Pt unable to transfer back to bed safely so Clarise Cruz Plus used to aide in transfer. Vc's needed for scapular and shoulder depression as Clarise Cruz lifted up. Support given to right knee to keep residual limb protected. Pt able to maintain standing x1 min in [place] before sitting to bed.    Ambulation/Gait             General Gait Details: unable due to weakness left leg and UE's making him unable to hop or rise up on left toes   Stairs            Wheelchair Mobility    Modified Rankin (Stroke Patients Only)       Balance Overall balance assessment: Needs assistance;History of Falls Sitting-balance support: No upper extremity supported Sitting balance-[name]: Good     Standing balance support: Bilateral upper extremity supported;During functional activity Standing balance-[name]: Poor Standing balance comment: much education given on safety for RLE with standing and other mobility activities. Pt with very flat affect and lack of sense of safety for right limb. Could verbalize safety  precautions after education but needs reinforcement                    Cognition Arousal/Alertness: Awake/alert Behavior During Therapy: Flat affect Overall Cognitive Status: Within Functional Limits for tasks assessed                      Exercises General Exercises - Lower Extremity Ankle Circles/Pumps: AROM;Left;10 reps;Seated Quad Sets: AROM;Both;10 reps;Seated Long Arc Quad: AROM;Both;10  reps;Seated Straight Leg Raises: AROM;Left;10 reps;Supine    General Comments General comments (skin integrity, edema, etc.): pt very flat, discussed depression with nsg after session, pt already medicated for this. Discussed effort level with pt and the need for him to participate at the best of his ability. Pt motivated to ambulate with prosthesis but holds back with mobility activities, fear? energy level issue?      Pertinent Vitals/Pain Pain Assessment: Faces Faces Pain Scale: Hurts a little bit Pain Location: RLE Pain Intervention(s): Limited activity within patient's tolerance;Monitored during session    Home Living                      Prior Function            PT Goals (current goals can now be found in the care plan section) Acute Rehab PT Goals Patient Stated Goal: Go to rehab so I can be independent again PT Goal Formulation: With patient Time For Goal Achievement: 02/14/14 Potential to Achieve Goals: Good Progress towards PT goals: Progressing toward goals    Frequency  Min 3X/week    PT Plan Current plan remains appropriate    Co-evaluation             End of Session Equipment Utilized During Treatment: Gait belt;Other (comment) (lift equipment) Activity Tolerance: Patient limited by fatigue Patient left: in bed;with call bell/phone within reach     Time: 0908-0959 PT Time Calculation (min) (ACUTE ONLY): 51 min  Charges:  $Therapeutic Exercise: 8-22 mins $Therapeutic Activity: 23-37 mins                    G Codes:     Leighton Roach, PT  Acute Rehab Services  [phone]  Leighton Roach 02/04/2014, 12:19 PM

## 2014-02-05 ENCOUNTER — Encounter: Payer: Self-pay | Admitting: Internal Medicine

## 2014-02-05 LAB — FACTOR 7 ASSAY: Factor VII Activity: 3 % — ABNORMAL LOW (ref 60–150)

## 2014-02-18 ENCOUNTER — Inpatient Hospital Stay (HOSPITAL_COMMUNITY)
Admission: AD | Admit: 2014-02-18 | Discharge: 2014-02-24 | DRG: 475 | Disposition: A | Discharge status: Skilled Nursing Facility | Payer: Medicaid Other | Source: Other Acute Inpatient Hospital | Attending: Internal Medicine | Admitting: Internal Medicine

## 2014-02-18 ENCOUNTER — Encounter (HOSPITAL_COMMUNITY): Payer: Self-pay | Admitting: General Practice

## 2014-02-18 DIAGNOSIS — K219 Gastro-esophageal reflux disease without esophagitis: Secondary | ICD-10-CM | POA: Diagnosis present

## 2014-02-18 DIAGNOSIS — F419 Anxiety disorder, unspecified: Secondary | ICD-10-CM | POA: Diagnosis present

## 2014-02-18 DIAGNOSIS — E876 Hypokalemia: Secondary | ICD-10-CM | POA: Diagnosis not present

## 2014-02-18 DIAGNOSIS — Y835 Amputation of limb(s) as the cause of abnormal reaction of the patient, or of later complication, without mention of misadventure at the time of the procedure: Secondary | ICD-10-CM | POA: Diagnosis present

## 2014-02-18 DIAGNOSIS — D638 Anemia in other chronic diseases classified elsewhere: Secondary | ICD-10-CM | POA: Diagnosis present

## 2014-02-18 DIAGNOSIS — I96 Gangrene, not elsewhere classified: Secondary | ICD-10-CM | POA: Diagnosis present

## 2014-02-18 DIAGNOSIS — E119 Type 2 diabetes mellitus without complications: Secondary | ICD-10-CM | POA: Diagnosis present

## 2014-02-18 DIAGNOSIS — F329 Major depressive disorder, single episode, unspecified: Secondary | ICD-10-CM | POA: Diagnosis present

## 2014-02-18 DIAGNOSIS — Z86711 Personal history of pulmonary embolism: Secondary | ICD-10-CM

## 2014-02-18 DIAGNOSIS — I129 Hypertensive chronic kidney disease with stage 1 through stage 4 chronic kidney disease, or unspecified chronic kidney disease: Secondary | ICD-10-CM | POA: Diagnosis present

## 2014-02-18 DIAGNOSIS — M868X6 Other osteomyelitis, lower leg: Secondary | ICD-10-CM | POA: Diagnosis present

## 2014-02-18 DIAGNOSIS — L0231 Cutaneous abscess of buttock: Secondary | ICD-10-CM | POA: Diagnosis not present

## 2014-02-18 DIAGNOSIS — I10 Essential (primary) hypertension: Secondary | ICD-10-CM | POA: Diagnosis present

## 2014-02-18 DIAGNOSIS — E785 Hyperlipidemia, unspecified: Secondary | ICD-10-CM | POA: Diagnosis present

## 2014-02-18 DIAGNOSIS — T8781 Dehiscence of amputation stump: Principal | ICD-10-CM | POA: Diagnosis present

## 2014-02-18 DIAGNOSIS — N179 Acute kidney failure, unspecified: Secondary | ICD-10-CM | POA: Diagnosis not present

## 2014-02-18 DIAGNOSIS — T8753 Necrosis of amputation stump, right lower extremity: Secondary | ICD-10-CM | POA: Diagnosis not present

## 2014-02-18 DIAGNOSIS — Z89511 Acquired absence of right leg below knee: Secondary | ICD-10-CM

## 2014-02-18 DIAGNOSIS — N184 Chronic kidney disease, stage 4 (severe): Secondary | ICD-10-CM | POA: Diagnosis present

## 2014-02-18 DIAGNOSIS — K5900 Constipation, unspecified: Secondary | ICD-10-CM | POA: Diagnosis present

## 2014-02-18 DIAGNOSIS — K3184 Gastroparesis: Secondary | ICD-10-CM | POA: Diagnosis present

## 2014-02-18 DIAGNOSIS — E1040 Type 1 diabetes mellitus with diabetic neuropathy, unspecified: Secondary | ICD-10-CM | POA: Diagnosis present

## 2014-02-18 DIAGNOSIS — Z6841 Body Mass Index (BMI) 40.0 and over, adult: Secondary | ICD-10-CM

## 2014-02-18 DIAGNOSIS — N183 Chronic kidney disease, stage 3 unspecified: Secondary | ICD-10-CM | POA: Diagnosis present

## 2014-02-18 DIAGNOSIS — E1065 Type 1 diabetes mellitus with hyperglycemia: Secondary | ICD-10-CM | POA: Diagnosis present

## 2014-02-18 DIAGNOSIS — Z86718 Personal history of other venous thrombosis and embolism: Secondary | ICD-10-CM | POA: Diagnosis not present

## 2014-02-18 DIAGNOSIS — Z7901 Long term (current) use of anticoagulants: Secondary | ICD-10-CM

## 2014-02-18 DIAGNOSIS — T8130XA Disruption of wound, unspecified, initial encounter: Secondary | ICD-10-CM | POA: Diagnosis present

## 2014-02-18 DIAGNOSIS — R339 Retention of urine, unspecified: Secondary | ICD-10-CM | POA: Diagnosis present

## 2014-02-18 DIAGNOSIS — E1051 Type 1 diabetes mellitus with diabetic peripheral angiopathy without gangrene: Secondary | ICD-10-CM | POA: Diagnosis present

## 2014-02-18 DIAGNOSIS — Z48817 Encounter for surgical aftercare following surgery on the skin and subcutaneous tissue: Secondary | ICD-10-CM | POA: Diagnosis not present

## 2014-02-18 DIAGNOSIS — Z794 Long term (current) use of insulin: Secondary | ICD-10-CM

## 2014-02-18 LAB — GLUCOSE, CAPILLARY
GLUCOSE-CAPILLARY: 116 mg/dL — AB (ref 70–99)
Glucose-Capillary: 129 mg/dL — ABNORMAL HIGH (ref 70–99)
Glucose-Capillary: 109 mg/dL — ABNORMAL HIGH (ref 70–99)
Glucose-Capillary: 157 mg/dL — ABNORMAL HIGH (ref 70–99)
Glucose-Capillary: 156 mg/dL — ABNORMAL HIGH (ref 70–99)
Glucose-Capillary: 128 mg/dL — ABNORMAL HIGH (ref 70–99)

## 2014-02-18 LAB — CBC
HCT: 28.6 % — ABNORMAL LOW (ref 39.0–52.0)
Hemoglobin: 9.1 g/dL — ABNORMAL LOW (ref 13.0–17.0)
MCH: 27.4 pg (ref 26.0–34.0)
MCHC: 31.8 g/dL (ref 30.0–36.0)
MCV: 86.1 fL (ref 78.0–100.0)
Platelets: 474 K/uL — ABNORMAL HIGH (ref 150–400)
RBC: 3.32 MIL/uL — ABNORMAL LOW (ref 4.22–5.81)
RDW: 14.9 % (ref 11.5–15.5)
WBC: 11.6 K/uL — ABNORMAL HIGH (ref 4.0–10.5)

## 2014-02-18 LAB — APTT
aPTT: 57 seconds — ABNORMAL HIGH (ref 24–37)
aPTT: 68 seconds — ABNORMAL HIGH (ref 24–37)

## 2014-02-18 LAB — SURGICAL PCR SCREEN
MRSA, PCR: NEGATIVE
STAPHYLOCOCCUS AUREUS: NEGATIVE

## 2014-02-18 LAB — COMPREHENSIVE METABOLIC PANEL
ALK PHOS: 165 U/L — AB (ref 39–117)
ALT: 12 U/L (ref 0–53)
AST: 14 U/L (ref 0–37)
Albumin: 1.4 g/dL — ABNORMAL LOW (ref 3.5–5.2)
Anion gap: 9 (ref 5–15)
BUN: 26 mg/dL — ABNORMAL HIGH (ref 6–23)
CALCIUM: 8.6 mg/dL (ref 8.4–10.5)
CO2: 23 mmol/L (ref 19–32)
Chloride: 107 mmol/L (ref 96–112)
Creatinine, Ser: 1.31 mg/dL (ref 0.50–1.35)
GFR, EST AFRICAN AMERICAN: 79 mL/min — AB (ref >90)
GFR, EST NON AFRICAN AMERICAN: 68 mL/min — AB (ref >90)
Glucose, Bld: 137 mg/dL — ABNORMAL HIGH (ref 70–99)
Potassium: 3.6 mmol/L (ref 3.5–5.1)
SODIUM: 139 mmol/L (ref 135–145)
Total Bilirubin: 0.8 mg/dL (ref 0.3–1.2)
Total Protein: 6.3 g/dL (ref 6.0–8.3)

## 2014-02-18 LAB — HEPARIN LEVEL (UNFRACTIONATED)
Heparin Unfractionated: 0.9 IU/mL — ABNORMAL HIGH (ref 0.30–0.70)
Heparin Unfractionated: 1.02 IU/mL — ABNORMAL HIGH (ref 0.30–0.70)

## 2014-02-18 MED ORDER — VANCOMYCIN HCL 10 G IV SOLR
1500.0000 mg | INTRAVENOUS | Status: AC
  Administered 2014-02-19: 1500 mg via INTRAVENOUS
  Filled 2014-02-18: qty 1500

## 2014-02-18 MED ORDER — ALPRAZOLAM 0.25 MG PO TABS: 0.2500 mg | ORAL_TABLET | Freq: Two times a day (BID) | ORAL | Status: DC | PRN

## 2014-02-18 MED ORDER — MORPHINE SULFATE 2 MG/ML IJ SOLN
1.0000 mg | INTRAMUSCULAR | Status: DC | PRN
  Administered 2014-02-18 – 2014-02-20 (×4): 1 mg via INTRAVENOUS
  Filled 2014-02-18 (×5): qty 1

## 2014-02-18 MED ORDER — CALCIUM CARBONATE ANTACID 500 MG PO CHEW
1.0000 | CHEWABLE_TABLET | Freq: Three times a day (TID) | ORAL | Status: DC
  Administered 2014-02-18 – 2014-02-24 (×17): 200 mg via ORAL
  Filled 2014-02-18 (×23): qty 1

## 2014-02-18 MED ORDER — ONDANSETRON HCL 4 MG PO TABS: 4.0000 mg | ORAL_TABLET | Freq: Four times a day (QID) | ORAL | Status: DC | PRN

## 2014-02-18 MED ORDER — NORTRIPTYLINE HCL 10 MG PO CAPS
10.0000 mg | ORAL_CAPSULE | Freq: Every day | ORAL | Status: DC
  Administered 2014-02-18 – 2014-02-23 (×6): 10 mg via ORAL
  Filled 2014-02-18 (×7): qty 1

## 2014-02-18 MED ORDER — SODIUM CHLORIDE 0.9 % IJ SOLN
3.0000 mL | Freq: Two times a day (BID) | INTRAMUSCULAR | Status: DC
Start: 2014-02-18 — End: 2014-02-19
  Administered 2014-02-18 (×2): 3 mL via INTRAVENOUS

## 2014-02-18 MED ORDER — INSULIN ASPART 100 UNIT/ML ~~LOC~~ SOLN
0.0000 [IU] | SUBCUTANEOUS | Status: DC
  Administered 2014-02-18: 1 [IU] via SUBCUTANEOUS
  Administered 2014-02-18 (×2): 2 [IU] via SUBCUTANEOUS
  Administered 2014-02-18: 1 [IU] via SUBCUTANEOUS
  Administered 2014-02-19 – 2014-02-20 (×2): 2 [IU] via SUBCUTANEOUS
  Administered 2014-02-20: 1 [IU] via SUBCUTANEOUS
  Administered 2014-02-20 (×4): 2 [IU] via SUBCUTANEOUS
  Administered 2014-02-21 (×4): 1 [IU] via SUBCUTANEOUS

## 2014-02-18 MED ORDER — SODIUM CHLORIDE 0.9 % IV SOLN
INTRAVENOUS | Status: DC
  Administered 2014-02-18: 23:00:00 via INTRAVENOUS

## 2014-02-18 MED ORDER — CARVEDILOL 25 MG PO TABS
25.0000 mg | ORAL_TABLET | Freq: Two times a day (BID) | ORAL | Status: DC
  Administered 2014-02-18 – 2014-02-24 (×12): 25 mg via ORAL
  Filled 2014-02-18 (×15): qty 1

## 2014-02-18 MED ORDER — APIXABAN 5 MG PO TABS: 5.0000 mg | ORAL_TABLET | Freq: Two times a day (BID) | ORAL | Status: DC

## 2014-02-18 MED ORDER — TAMSULOSIN HCL 0.4 MG PO CAPS
0.4000 mg | ORAL_CAPSULE | Freq: Every day | ORAL | Status: DC
  Administered 2014-02-18 – 2014-02-24 (×6): 0.4 mg via ORAL
  Filled 2014-02-18 (×7): qty 1

## 2014-02-18 MED ORDER — ACETAMINOPHEN 325 MG PO TABS: 325.0000 mg | ORAL_TABLET | ORAL | Status: DC | PRN

## 2014-02-18 MED ORDER — GABAPENTIN 300 MG PO CAPS
300.0000 mg | ORAL_CAPSULE | Freq: Three times a day (TID) | ORAL | Status: DC
  Administered 2014-02-18 – 2014-02-24 (×17): 300 mg via ORAL
  Filled 2014-02-18 (×20): qty 1

## 2014-02-18 MED ORDER — AMLODIPINE BESYLATE 10 MG PO TABS
10.0000 mg | ORAL_TABLET | Freq: Every day | ORAL | Status: DC
  Administered 2014-02-19 – 2014-02-24 (×6): 10 mg via ORAL
  Filled 2014-02-18 (×6): qty 1

## 2014-02-18 MED ORDER — ONDANSETRON HCL 4 MG/2ML IJ SOLN
4.0000 mg | Freq: Four times a day (QID) | INTRAMUSCULAR | Status: DC | PRN
  Administered 2014-02-20: 4 mg via INTRAVENOUS
  Filled 2014-02-18: qty 2

## 2014-02-18 MED ORDER — SENNOSIDES-DOCUSATE SODIUM 8.6-50 MG PO TABS
2.0000 | ORAL_TABLET | Freq: Two times a day (BID) | ORAL | Status: DC
  Administered 2014-02-18 – 2014-02-24 (×8): 2 via ORAL
  Filled 2014-02-18 (×11): qty 2

## 2014-02-18 MED ORDER — SODIUM CHLORIDE 0.9 % IJ SOLN: 3.0000 mL | INTRAMUSCULAR | Status: DC | PRN

## 2014-02-18 MED ORDER — INSULIN GLARGINE 100 UNIT/ML ~~LOC~~ SOLN
5.0000 [IU] | Freq: Every day | SUBCUTANEOUS | Status: DC
  Administered 2014-02-18 – 2014-02-23 (×6): 5 [IU] via SUBCUTANEOUS
  Filled 2014-02-18 (×9): qty 0.05

## 2014-02-18 MED ORDER — SODIUM CHLORIDE 0.9 % IV SOLN: 250.0000 mL | INTRAVENOUS | Status: DC | PRN

## 2014-02-18 MED ORDER — PANTOPRAZOLE SODIUM 40 MG PO TBEC
40.0000 mg | DELAYED_RELEASE_TABLET | Freq: Two times a day (BID) | ORAL | Status: DC
  Administered 2014-02-18 – 2014-02-24 (×11): 40 mg via ORAL
  Filled 2014-02-18 (×11): qty 1

## 2014-02-18 MED ORDER — ESCITALOPRAM OXALATE 20 MG PO TABS
20.0000 mg | ORAL_TABLET | Freq: Every day | ORAL | Status: DC
  Administered 2014-02-18 – 2014-02-23 (×6): 20 mg via ORAL
  Filled 2014-02-18 (×7): qty 1

## 2014-02-18 MED ORDER — PRAVASTATIN SODIUM 20 MG PO TABS
20.0000 mg | ORAL_TABLET | Freq: Every day | ORAL | Status: DC
  Administered 2014-02-18 – 2014-02-24 (×6): 20 mg via ORAL
  Filled 2014-02-18 (×7): qty 1

## 2014-02-18 MED ORDER — HEPARIN (PORCINE) IN NACL 100-0.45 UNIT/ML-% IJ SOLN
1950.0000 [IU]/h | INTRAMUSCULAR | Status: DC
  Administered 2014-02-18: 2000 [IU]/h via INTRAVENOUS
  Administered 2014-02-18: 1900 [IU]/h via INTRAVENOUS
  Filled 2014-02-18 (×2): qty 250

## 2014-02-18 MED ORDER — ADULT MULTIVITAMIN W/MINERALS CH
1.0000 | ORAL_TABLET | Freq: Every day | ORAL | Status: DC
  Administered 2014-02-19 – 2014-02-24 (×6): 1 via ORAL
  Filled 2014-02-18 (×7): qty 1

## 2014-02-18 MED ORDER — FUROSEMIDE 40 MG PO TABS
40.0000 mg | ORAL_TABLET | Freq: Every day | ORAL | Status: DC
  Administered 2014-02-19 – 2014-02-24 (×6): 40 mg via ORAL
  Filled 2014-02-18 (×6): qty 1

## 2014-02-18 MED ORDER — OXYCODONE HCL 5 MG PO TABS
15.0000 mg | ORAL_TABLET | Freq: Four times a day (QID) | ORAL | Status: DC | PRN
  Administered 2014-02-18 – 2014-02-19 (×3): 15 mg via ORAL
  Filled 2014-02-18 (×2): qty 3

## 2014-02-18 NOTE — Consult Note (Signed)
Reason for Consult:  Right leg wound Referring Physician:  Dr. Bruce Donath is an 39 y.o. male.  HPI:  39 y/o male with PMH of diabetes is admitted now for his right LE wound.  He underwent R BKA on 12/14 for progressive abscess and gangrene of the foot.  He initially did well and was sent to rehab.  He was readmitted for medical issues.  He was later sent to a rehab facility in [place] where his wound began looking worse.  He underwent I and D by a general surgeon a week ago.  He was transferred to [facility] yesterday for further evaluation and management of the right leg wound.  He denies any significant pain.  He denies f/c/n/v/wt loss.  He is not on abx but is on heparin.  Past Medical History  Diagnosis Date  . DVT (deep venous thrombosis) 09/2002    patient reports additional DVTs in '06 & '11 (unconfirmed)  . Pulmonary embolism 09/2002    treated with 6 months of warfarin  . Hypertension   . Chest pain, neg MI, normal coronaries by cath 02/18/2013  . Hyperlipidemia 02/19/2013  . Acute venous embolism and thrombosis of deep vessels of proximal lower extremity 07/19/2011  . Type I diabetes mellitus dx'd 2001  . GERD (gastroesophageal reflux disease)   . Diabetic ulcer of right foot   . CKD (chronic kidney disease) stage 3, GFR 30-59 ml/min 02/19/2013  . Nephrotic syndrome   . Obesity     BMI 44, weight 346 pounds 01/30/14    Past Surgical History  Procedure Laterality Date  . Incision and drainage abscess  2007; 2015    "back"  . Cardiac catheterization  02/18/2013    normal coronaries  . I&d extremity Right 12/14/2013    Procedure: IRRIGATION AND DEBRIDEMENT RIGHT FOOT;  Surgeon: [name], MD;  Location: [place];  Service: Orthopedics;  Laterality: Right;  . Left heart catheterization with coronary angiogram N/A 02/18/2013    Procedure: LEFT HEART CATHETERIZATION WITH CORONARY ANGIOGRAM;  Surgeon: [name], MD;  [facility] LAB;  Service: Cardiovascular;   Laterality: N/A;  . Amputation Right 12/22/2013    Procedure: AMPUTATION BELOW KNEE;  Surgeon: [name], MD;  Location: [place];  Service: Orthopedics;  Laterality: Right;  . Incision and drainage of wound Right 12/22/2013    Procedure: I&D RIGHT BUTTOCK;  Surgeon: [name], MD;  Location: [place];  Service: Orthopedics;  Laterality: Right;    Family History  Problem Relation Age of Onset  . Heart disease    . Diabetes    . Obesity      Social History:  reports that he has never smoked. He has never used smokeless tobacco. He reports that he does not drink alcohol or use illicit drugs.  Allergies: No Known Allergies  Medications: I have reviewed the patient's current medications.  Results for orders placed or performed during the hospital encounter of 02/18/14 (from the past 48 hour(s))  Surgical pcr screen     Status: None   Collection Time: 02/18/14  3:20 AM  Result Value Ref Range   MRSA, PCR NEGATIVE NEGATIVE   Staphylococcus aureus NEGATIVE NEGATIVE    Comment:        The Xpert SA Assay (FDA approved for NASAL specimens in patients over 18 years of age), is one component of a comprehensive surveillance program.  Test performance has been validated by [facility] for patients greater than or equal  to 55 year old. It is not intended to diagnose infection nor to guide or monitor treatment.   Comprehensive metabolic panel     Status: Abnormal   Collection Time: 02/18/14  4:11 AM  Result Value Ref Range   Sodium 139 135 - 145 mmol/L   Potassium 3.6 3.5 - 5.1 mmol/L   Chloride 107 96 - 112 mmol/L   CO2 23 19 - 32 mmol/L   Glucose, Bld 137 (H) 70 - 99 mg/dL   BUN 26 (H) 6 - 23 mg/dL   Creatinine, Ser 1.31 0.50 - 1.35 mg/dL   Calcium 8.6 8.4 - 10.5 mg/dL   Total Protein 6.3 6.0 - 8.3 g/dL   Albumin 1.4 (L) 3.5 - 5.2 g/dL   AST 14 0 - 37 U/L   ALT 12 0 - 53 U/L   Alkaline Phosphatase 165 (H) 39 - 117 U/L   Total Bilirubin 0.8 0.3 - 1.2 mg/dL   GFR calc non Af  Amer 68 (L) >90 mL/min   GFR calc Af Amer 79 (L) >90 mL/min    Comment: (NOTE) The eGFR has been calculated using the CKD EPI equation. This calculation has not been validated in all clinical situations. eGFR's persistently <90 mL/min signify possible Chronic Kidney Disease.    Anion gap 9 5 - 15  CBC     Status: Abnormal   Collection Time: 02/18/14  4:11 AM  Result Value Ref Range   WBC 11.6 (H) 4.0 - 10.5 K/uL   RBC 3.32 (L) 4.22 - 5.81 MIL/uL   Hemoglobin 9.1 (L) 13.0 - 17.0 g/dL   HCT 28.6 (L) 39.0 - 52.0 %   MCV 86.1 78.0 - 100.0 fL   MCH 27.4 26.0 - 34.0 pg   MCHC 31.8 30.0 - 36.0 g/dL   RDW 14.9 11.5 - 15.5 %   Platelets 474 (H) 150 - 400 K/uL  Glucose, capillary     Status: Abnormal   Collection Time: 02/18/14  4:25 AM  Result Value Ref Range   Glucose-Capillary 129 (H) 70 - 99 mg/dL  Heparin level (unfractionated)     Status: Abnormal   Collection Time: 02/18/14  5:13 AM  Result Value Ref Range   Heparin Unfractionated 0.90 (H) 0.30 - 0.70 IU/mL    Comment: RESULTS CONFIRMED BY MANUAL DILUTION        IF HEPARIN RESULTS ARE BELOW EXPECTED VALUES, AND PATIENT DOSAGE HAS BEEN CONFIRMED, SUGGEST FOLLOW UP TESTING OF ANTITHROMBIN III LEVELS.   Glucose, capillary     Status: Abnormal   Collection Time: 02/18/14  7:54 AM  Result Value Ref Range   Glucose-Capillary 109 (H) 70 - 99 mg/dL  APTT     Status: Abnormal   Collection Time: 02/18/14  8:57 AM  Result Value Ref Range   aPTT 57 (H) 24 - 37 seconds    Comment:        IF BASELINE aPTT IS ELEVATED, SUGGEST PATIENT RISK ASSESSMENT BE USED TO DETERMINE APPROPRIATE ANTICOAGULANT THERAPY.   Glucose, capillary     Status: Abnormal   Collection Time: 02/18/14 12:02 PM  Result Value Ref Range   Glucose-Capillary 157 (H) 70 - 99 mg/dL    No results found.  ROS:  As above PE:  Blood pressure 131/61, pulse 88, temperature 98.8 F (37.1 C), temperature source Oral, resp. rate 18, height _0  (1.88 m), weight  150.821 kg (332 lb 8 oz), SpO2 98 %.  Obese male in nad.  A and O  x 4.  Mood appears depressed, affect is flat.  EOMI.  Resp unlabored.  R LE dressing removed.  The distal incision is open with the tibia protruding.  There is necrosis of the posterior flap over a large area.  Anterior skin edges are necrotic as well.  There is a foul odor but no pus.  Serous drainage at the lateral edge of the wound.  5/5 strength at the quad.  Sens to LT intact at the stump.  No lymphadenopathy.  Cap refill at the wound edges is slow.  Assessment/Plan: S/p R BKA 2 months ago now with wound dehiscence and posterior flap necrosis - based on the extensive necrosis of the posterior flap, I don't believe that the BKA can be successfully revised.  There is insufficient skin to allow coverage of the tibia at any functional amputation level.  I believe it's necessary to amputate through a generally healthy area and optimize the chances of successful healing.  This situation requires an above knee amputation.  The risks and benefits of the alternative treatment options have been discussed in detail.  The patient wishes to proceed with surgery and specifically understands risks of bleeding, infection, nerve damage, blood clots, need for additional surgery, amputation and death.  We'll get him schedule for surgery tomorrow.   [name] 02/18/2014, 1:27 PM

## 2014-02-18 NOTE — Progress Notes (Signed)
ANTICOAGULATION CONSULT NOTE - Initial Consult  Pharmacy Consult for Heparin  Indication: h/o PE/DVT   No Known Allergies  Patient Measurements: Height: 6\' 2"  (188 cm) Weight: (!) 332 lb 8 oz (150.821 kg) IBW/kg (Calculated) : 82.2 Heparin Dosing Weight: 117 kg   Vital Signs: Temp: 99 F (37.2 C) (02/10 1411) Temp Source: Oral (02/10 1411) BP: 137/75 mmHg (02/10 1411) Pulse Rate: 95 (02/10 1411)  Labs:  Recent Labs  02/18/14 0411 02/18/14 0513 02/18/14 0857 02/18/14 1400  HGB 9.1*  --   --   --   HCT 28.6*  --   --   --   PLT 474*  --   --   --   APTT  --   --  57* 68*  HEPARINUNFRC  --  0.90*  --  1.02*  CREATININE 1.31  --   --   --     Estimated Creatinine Clearance: 119.7 mL/min (by C-G formula based on Cr of 1.31).   Medical History: Past Medical History  Diagnosis Date  . DVT (deep venous thrombosis) 09/2002    patient reports additional DVTs in '06 & '11 (unconfirmed)  . Pulmonary embolism 09/2002    treated with 6 months of warfarin  . Hypertension   . Chest pain, neg MI, normal coronaries by cath 02/18/2013  . Hyperlipidemia 02/19/2013  . Acute venous embolism and thrombosis of deep vessels of proximal lower extremity 07/19/2011  . Type I diabetes mellitus dx'd 2001  . GERD (gastroesophageal reflux disease)   . Diabetic ulcer of right foot   . CKD (chronic kidney disease) stage 3, GFR 30-59 ml/min 02/19/2013  . Nephrotic syndrome   . Obesity     BMI 44, weight 346 pounds 01/30/14    Medications:  Prescriptions prior to admission  Medication Sig Dispense Refill Last Dose  . amLODipine (NORVASC) 10 MG tablet Take 1 tablet (10 mg total) by mouth daily. 30 tablet 1 02/17/2014 at Unknown time  . apixaban (ELIQUIS) 5 MG TABS tablet Take 1 tablet (5 mg total) by mouth 2 (two) times daily. 60 tablet 0 unknown  . calcium carbonate (TUMS - DOSED IN MG ELEMENTAL CALCIUM) 500 MG chewable tablet Chew 1 tablet (200 mg of elemental calcium total) by mouth 3 (three)  times daily with meals. 100 tablet 1 02/17/2014 at Unknown time  . carvedilol (COREG) 25 MG tablet Take 1 tablet (25 mg total) by mouth 2 (two) times daily with a meal. 30 tablet 0 02/17/2014 at 1839  . escitalopram (LEXAPRO) 20 MG tablet Take 1 tablet (20 mg total) by mouth at bedtime. 30 tablet 1 02/16/2014  . furosemide (LASIX) 40 MG tablet Take 1 tablet (40 mg total) by mouth daily. 30 tablet 1 02/17/2014 at Unknown time  . gabapentin (NEURONTIN) 300 MG capsule Take 1 capsule (300 mg total) by mouth 3 (three) times daily. 90 capsule 1 02/17/2014 at Unknown time  . insulin NPH-regular Human (NOVOLIN 70/30) (70-30) 100 UNIT/ML injection Inject 10 Units into the skin 2 (two) times daily with a meal. 10 mL 11 02/17/2014 at Unknown time  . lisinopril (PRINIVIL,ZESTRIL) 10 MG tablet Take 1 tablet (10 mg total) by mouth daily. 30 tablet 0 02/17/2014 at Unknown time  . lovastatin (MEVACOR) 20 MG tablet Take 1 tablet (20 mg total) by mouth daily. 30 tablet 11 unknown  . morphine (MS CONTIN) 15 MG 12 hr tablet Take 15 mg by mouth 2 (two) times daily.   02/17/2014 at Unknown time  .  Multiple Vitamin (MULTIVITAMIN WITH MINERALS) TABS tablet Take 1 tablet by mouth daily. 30 tablet 1 02/17/2014 at Unknown time  . nortriptyline (PAMELOR) 10 MG capsule Take 1 capsule (10 mg total) by mouth at bedtime. 30 capsule 1 02/16/2014  . omeprazole (PRILOSEC) 20 MG capsule Take 20 mg by mouth 2 (two) times daily before a meal.   02/17/2014 at Unknown time  . oxyCODONE (ROXICODONE) 15 MG immediate release tablet Take 1 tablet (15 mg total) by mouth every 6 (six) hours as needed. 120 tablet 0 02/16/2014  . pantoprazole (PROTONIX) 40 MG tablet Take 1 tablet (40 mg total) by mouth 2 (two) times daily. 60 tablet 1 unknown  . senna-docusate (SENOKOT-S) 8.6-50 MG per tablet Take 2 tablets by mouth 2 (two) times daily. 120 tablet 1 02/17/2014 at Unknown time  . tamsulosin (FLOMAX) 0.4 MG CAPS capsule Take 1 capsule (0.4 mg total) by mouth daily after  supper. (Patient taking differently: Take 0.4 mg by mouth daily. ) 30 capsule 1 02/17/2014 at Unknown time  . acetaminophen (TYLENOL) 325 MG tablet Take 1-2 tablets (325-650 mg total) by mouth every 4 (four) hours as needed for mild pain. (Patient not taking: Reported on 02/18/2014)   Not Taking at Unknown time  . ALPRAZolam (XANAX) 0.25 MG tablet Take 1 tablet (0.25 mg total) by mouth 2 (two) times daily as needed for anxiety. (Patient not taking: Reported on 02/18/2014) 30 tablet 0 Not Taking at Unknown time  . methocarbamol (ROBAXIN) 500 MG tablet Take 1 tablet (500 mg total) by mouth every 6 (six) hours as needed for muscle spasms. (Patient not taking: Reported on 02/18/2014) 90 tablet 1 Not Taking at Unknown time    Assessment: 38 YOM on Eliquis prior to admission for PE/DVT in 2011 being switched to heparin infusion. Per patient, he has not been on Eliquis for about a week due to continued bleeding from right stump post BKA. Per patient, the bleeding has stopped. STAT PT/INR and aPTT ordered but not resulted yet. H/H is low but platelets are wnl.   Follow-up aPTT is therapeutic at 68 but on the lower limit of normal. There is not bleeding noted. Will increase slightly to maintain therapeutic level.  Goal of Therapy:  Heparin level 0.3-0.7 units/ml aPTT 66-102 seconds Monitor platelets by anticoagulation protocol: Yes   Plan:  -Increase heparin to 2000 units/hr -F/u 6 hr HL -Monitor CBC, daily HL/aPTT and s/s of bleeding from stump   Andrey Cota. Diona Foley, PharmD Clinical Pharmacist Pager [phone]

## 2014-02-18 NOTE — Progress Notes (Signed)
ANTICOAGULATION CONSULT NOTE - Initial Consult  Pharmacy Consult for Heparin  Indication: h/o PE/DVT   No Known Allergies  Patient Measurements: Height: 6\' 2"  (188 cm) Weight: (!) 332 lb 8 oz (150.821 kg) IBW/kg (Calculated) : 82.2 Heparin Dosing Weight: 117 kg   Vital Signs: Temp: 98.8 F (37.1 C) (02/10 0429) Temp Source: Oral (02/10 0429) BP: 131/61 mmHg (02/10 0429) Pulse Rate: 88 (02/10 0429)  Labs: No results for input(s): HGB, HCT, PLT, APTT, LABPROT, INR, HEPARINUNFRC, CREATININE, CKTOTAL, CKMB, TROPONINI in the last 72 hours.  Estimated Creatinine Clearance: 112.8 mL/min (by C-G formula based on Cr of 1.39).   Medical History: Past Medical History  Diagnosis Date  . DVT (deep venous thrombosis) 09/2002    patient reports additional DVTs in '06 & '11 (unconfirmed)  . Pulmonary embolism 09/2002    treated with 6 months of warfarin  . Hypertension   . Chest pain, neg MI, normal coronaries by cath 02/18/2013  . Hyperlipidemia 02/19/2013  . Acute venous embolism and thrombosis of deep vessels of proximal lower extremity 07/19/2011  . Type I diabetes mellitus dx'd 2001  . GERD (gastroesophageal reflux disease)   . Diabetic ulcer of right foot   . CKD (chronic kidney disease) stage 3, GFR 30-59 ml/min 02/19/2013  . Nephrotic syndrome   . Obesity     BMI 44, weight 346 pounds 01/30/14    Medications:  Prescriptions prior to admission  Medication Sig Dispense Refill Last Dose  . acetaminophen (TYLENOL) 325 MG tablet Take 1-2 tablets (325-650 mg total) by mouth every 4 (four) hours as needed for mild pain.   unknown  . ALPRAZolam (XANAX) 0.25 MG tablet Take 1 tablet (0.25 mg total) by mouth 2 (two) times daily as needed for anxiety. 30 tablet 0 Past Week at Unknown time  . amLODipine (NORVASC) 10 MG tablet Take 1 tablet (10 mg total) by mouth daily. 30 tablet 1   . apixaban (ELIQUIS) 5 MG TABS tablet Take 1 tablet (5 mg total) by mouth 2 (two) times daily. 60 tablet 0   .  calcium carbonate (TUMS - DOSED IN MG ELEMENTAL CALCIUM) 500 MG chewable tablet Chew 1 tablet (200 mg of elemental calcium total) by mouth 3 (three) times daily with meals. 100 tablet 1   . carvedilol (COREG) 25 MG tablet Take 1 tablet (25 mg total) by mouth 2 (two) times daily with a meal. 30 tablet 0   . escitalopram (LEXAPRO) 20 MG tablet Take 1 tablet (20 mg total) by mouth at bedtime. 30 tablet 1   . furosemide (LASIX) 40 MG tablet Take 1 tablet (40 mg total) by mouth daily. 30 tablet 1   . gabapentin (NEURONTIN) 300 MG capsule Take 1 capsule (300 mg total) by mouth 3 (three) times daily. 90 capsule 1   . insulin NPH-regular Human (NOVOLIN 70/30) (70-30) 100 UNIT/ML injection Inject 10 Units into the skin 2 (two) times daily with a meal. 10 mL 11   . lisinopril (PRINIVIL,ZESTRIL) 10 MG tablet Take 1 tablet (10 mg total) by mouth daily. 30 tablet 0   . lovastatin (MEVACOR) 20 MG tablet Take 1 tablet (20 mg total) by mouth daily. 30 tablet 11   . methocarbamol (ROBAXIN) 500 MG tablet Take 1 tablet (500 mg total) by mouth every 6 (six) hours as needed for muscle spasms. 90 tablet 1   . Multiple Vitamin (MULTIVITAMIN WITH MINERALS) TABS tablet Take 1 tablet by mouth daily. 30 tablet 1   . nortriptyline (  PAMELOR) 10 MG capsule Take 1 capsule (10 mg total) by mouth at bedtime. 30 capsule 1   . oxyCODONE (ROXICODONE) 15 MG immediate release tablet Take 1 tablet (15 mg total) by mouth every 6 (six) hours as needed. 120 tablet 0   . pantoprazole (PROTONIX) 40 MG tablet Take 1 tablet (40 mg total) by mouth 2 (two) times daily. 60 tablet 1   . senna-docusate (SENOKOT-S) 8.6-50 MG per tablet Take 2 tablets by mouth 2 (two) times daily. 120 tablet 1   . tamsulosin (FLOMAX) 0.4 MG CAPS capsule Take 1 capsule (0.4 mg total) by mouth daily after supper. 30 capsule 1     Assessment: 38 YOM on Eliquis prior to admission for PE/DVT in 2011 being switched to heparin infusion. Per patient, he has not been on  Eliquis for about a week due to continued bleeding from right stump post BKA. Per patient, the bleeding has stopped. STAT PT/INR and aPTT ordered but not resulted yet. H/H is low but platelets are wnl.   Goal of Therapy:  Heparin level 0.3-0.7 units/ml Monitor platelets by anticoagulation protocol: Yes   Plan:  -Start heparin infusion at 1900 units/hr. No bolus since unsure if Eliquis has completely been cleared. If BL HL is wnl and no bleeding noted, consider bolus dosing in the future.  -F/u 6 hr HL -Monitor CBC, daily HL/aPTT and s/s of bleeding from stump   Albertina Parr, PharmD., BCPS Clinical Pharmacist Pager [phone]

## 2014-02-18 NOTE — Progress Notes (Signed)
Bladder scan completed per order. Residual is 244

## 2014-02-18 NOTE — Progress Notes (Signed)
Subjective: Patient states he is having significant pain, although reluctant to bring this forward. He states that he does not like to complain.   Objective: Vital signs in last 24 hours: Filed Vitals:   02/18/14 0300 02/18/14 0354 02/18/14 0429 02/18/14 1411  BP: 126/92  131/61 137/75  Pulse: 88  88 95  Temp: 98.9 F (37.2 C)  98.8 F (37.1 C) 99 F (37.2 C)  TempSrc: Oral  Oral Oral  Resp: 22 18  18   Height: 6\' 2"  (1.88 m)     Weight: 332 lb 8 oz (150.821 kg)     SpO2: 100%  98% 99%   Weight change:   Intake/Output Summary (Last 24 hours) at 02/18/14 1426 Last data filed at 02/18/14 0918  Gross per 24 hour  Intake      0 ml  Output    900 ml  Net   -900 ml   Physical Exam General: alert, pleasant, NAD HEENT: Pace/AT, EOMI, mucus membranes moist CV: RRR, no m/g/r Pulm: CTA bilaterally, breaths non-labored Abd: BS+, soft, obese, non-tender Ext: R BKA stump covered with ace bandage/dressing. Foul odor present. Left leg has 2+ pitting edema.  Neuro: alert and oriented x 3, no focal deficits  Lab Results: Basic Metabolic Panel:  Recent Labs Lab 02/18/14 0411  NA 139  K 3.6  CL 107  CO2 23  GLUCOSE 137*  BUN 26*  CREATININE 1.31  CALCIUM 8.6   Liver Function Tests:  Recent Labs Lab 02/18/14 0411  AST 14  ALT 12  ALKPHOS 165*  BILITOT 0.8  PROT 6.3  ALBUMIN 1.4*   CBC:  Recent Labs Lab 02/18/14 0411  WBC 11.6*  HGB 9.1*  HCT 28.6*  MCV 86.1  PLT 474*   CBG:  Recent Labs Lab 02/18/14 0425 02/18/14 0754 02/18/14 1202  GLUCAP 129* 109* 157*   Medications: I have reviewed the patient's current medications. Scheduled Meds: . amLODipine  10 mg Oral Daily  . calcium carbonate  1 tablet Oral TID WC  . carvedilol  25 mg Oral BID WC  . escitalopram  20 mg Oral QHS  . furosemide  40 mg Oral Daily  . gabapentin  300 mg Oral TID  . insulin aspart  0-9 Units Subcutaneous 6 times per day  . multivitamin with minerals  1 tablet Oral Daily  .  nortriptyline  10 mg Oral QHS  . pantoprazole  40 mg Oral BID  . pravastatin  20 mg Oral q1800  . senna-docusate  2 tablet Oral BID  . sodium chloride  3 mL Intravenous Q12H  . tamsulosin  0.4 mg Oral QPC supper   Continuous Infusions: . heparin 1,900 Units/hr (02/18/14 0817)   PRN Meds:.sodium chloride, acetaminophen, ALPRAZolam, morphine injection, ondansetron **OR** ondansetron (ZOFRAN) IV, oxyCODONE, sodium chloride Assessment/Plan:  R Leg BKA Wound Dehiscence and Necrosis: Ortho consulted this morning. Will proceed with surgery tomorrow for right AKA.  - Ortho following, appreciate recommendations  - NPO at midnight - Continue heparin gtt - Continue IV Vanc  - Dressing changes per ortho  Diabetes Mellitus: Unclear if type 1 or 2. Previously on 70/30 10 units BID, but also has previously been on Metformin. Will place on type 1 regimen to avoid hyperglycemia.  - Start Lantus 5 units QHS - ISS - CBGs 4 times daily   Hx Idiopathic DVT in LLE and PE: On Eliquis at home. Held due to surgery tomorrow. - Continue heparin gtt  Anemia of Chronic Disease: Hbg  stable at 9.1 this AM. Baseline is 8-9.  - CBC intermittently   CKD Stage 3: Cr 1.31 on admission. Baseline difficult to assess, ~2.1-2.3 in the past. Will continue to monitor. - bmet tomorrow AM - Continue Lasix  Neuropathic Pain:  - Continue Gabapentin 300 mg TID - Continue Nortriptyline 10 mg daily   HTN: BP stable in [id number] systolic. Patient takes Amloidpine, Coreg, Lasix, and Lisinopril at home. - Continue Amlodipine 10 mg daily - Continue Coreg 25 mg BID - Continue Lasix 40 mg daily - Hold Lisinopril for now, can add back if BP becomes elevated   HLD: Lipid panel on 02/19/13 shows Chol 258, Trigly 329, HDL 45, and LDL 147. Patient takes Pravastatin at home. - Continue Pravastatin 20 mg daily   GERD: - Continue Protonix 40 mg BID  Constipation:  - Continue Sennokot   Diet: Carb modified, NPO at midnight    VTE PPx: Heparin gtt Dispo: Disposition is deferred at this time, awaiting improvement of current medical problems.  Anticipated discharge in approximately 1-2 day(s).   The patient does have a current PCP Lucious Groves, DO) and does need an [facility] hospital follow-up appointment after discharge.  The patient does not have transportation limitations that hinder transportation to clinic appointments.  .Services Needed at time of discharge: Y = Yes, Blank = No PT:   OT:   RN:   Equipment:   Other:     LOS: 0 days   Albin Felling, MD 02/18/2014, 2:26 PM

## 2014-02-18 NOTE — Progress Notes (Signed)
Patient arrived to unit during  Epic down-time. Patient is alert and oriented per baseline. Bed in the lowest position and call light in reach. Will continue to monitor

## 2014-02-18 NOTE — H&P (Signed)
Date: 02/18/2014               Patient Name:  [name] MRN: [id number]  DOB: [birth year] Age / Sex: 39 y.o., male   PCP: Lucious Groves, DO         Medical Service: Internal Medicine Teaching Service         Attending Physician: Dr. Bartholomew Crews, MD    First Contact: Dr. Arcelia Jew Pager: [phone]  Second Contact: Dr. Naaman Plummer Pager: [phone]       After Hours (After 5p/  First Contact Pager: [phone]  weekends / holidays): Second Contact Pager: [phone]   Chief Complaint: right stump pain, bleeding.  History of Present Illness:   39 yo male with DM type I, CKD stage 3, nephrotic syndrome, HTN, DVT (2006, 2011), PE- on eliquis, recent right leg  BKA by Dr. [name] on 12/22/2013 due to right foot abscess and wet gangrene. Failed I&D and limb savage attempts. Was in [place] and 12/18 to 01/20/14. [facility] with wound dehiscence. At that time xray was neg for fx or osteo, Dr Doran Durand saw pt that tme, removed staples and follow up with wound care and at his office on 02/20/14. along with daily dressing changes. During that admission he also had INR elevation, got vit K with improvement of INR. Was switched from coumadin to eliquis 5mg  BID as his INR was difficult to control.  Went to [facility] at [place], [place]. He reports falling few times on the stump. From there he went to [facility], [place], [place] on 02/12/14 for bleeding and clotting around his right stump. He was taken to OR by Dr. Chauncey Mann on 02/12/14 for debriment, had 18x9 open wound per OP report, and also had hematoma evacuated. He had low hgb 9 with SOB, got 2 units pRBCs for symptomatic anemia with improvement. He was given 2-3 doses of vanc as well for concern for infection, but he did not have any fever there. Dr. Chauncey Mann recommended doing revision of his right BKA to AKA as there is not enough bone to support his muscle currently. He was transferred to [facility] for continuity of care and for possible AKA by Dr. Doran Durand.    Patient is having 6/10 pain on the right stump. Per patient, the bleeding has stopped. He had his last dressing change yesterday. We did not take off his dressing as there is a open wound underneath. He denies any other complaint. He had some trouble emptying his bladder post op there but was voiding fine afterwards. Denies any burning with urination. Denies any fever/chills, n/v, diarrhea.    Meds: Current Facility-Administered Medications  Medication Dose Route Frequency Provider Last Rate Last Dose  . 0.9 %  sodium chloride infusion  250 mL Intravenous PRN Blain Pais, MD      . acetaminophen (TYLENOL) tablet 325-650 mg  325-650 mg Oral Q4H PRN Blain Pais, MD      . ALPRAZolam Duanne Moron) tablet 0.25 mg  0.25 mg Oral BID PRN Blain Pais, MD      . amLODipine (NORVASC) tablet 10 mg  10 mg Oral Daily Blain Pais, MD      . apixaban (ELIQUIS) tablet 5 mg  5 mg Oral BID Blain Pais, MD      . calcium carbonate (TUMS - dosed in mg elemental calcium) chewable tablet 200 mg of elemental calcium  1 tablet Oral TID WC Blain Pais, MD      .  carvedilol (COREG) tablet 25 mg  25 mg Oral BID WC Blain Pais, MD      . escitalopram (LEXAPRO) tablet 20 mg  20 mg Oral QHS Blain Pais, MD      . furosemide (LASIX) tablet 40 mg  40 mg Oral Daily Blain Pais, MD      . gabapentin (NEURONTIN) capsule 300 mg  300 mg Oral TID Blain Pais, MD      . multivitamin with minerals tablet 1 tablet  1 tablet Oral Daily Blain Pais, MD      . nortriptyline (PAMELOR) capsule 10 mg  10 mg Oral QHS Blain Pais, MD      . ondansetron [facility]) tablet 4 mg  4 mg Oral Q6H PRN Blain Pais, MD       Or  . ondansetron (ZOFRAN) injection 4 mg  4 mg Intravenous Q6H PRN Blain Pais, MD      . oxyCODONE (Oxy IR/ROXICODONE) immediate release tablet 15 mg  15 mg Oral Q6H PRN Blain Pais, MD      . pantoprazole  (PROTONIX) EC tablet 40 mg  40 mg Oral BID Blain Pais, MD      . pravastatin (PRAVACHOL) tablet 20 mg  20 mg Oral q1800 Blain Pais, MD      . senna-docusate (Senokot-S) tablet 2 tablet  2 tablet Oral BID Blain Pais, MD      . sodium chloride 0.9 % injection 3 mL  3 mL Intravenous Q12H Blain Pais, MD      . sodium chloride 0.9 % injection 3 mL  3 mL Intravenous PRN Blain Pais, MD      . tamsulosin (FLOMAX) capsule 0.4 mg  0.4 mg Oral QPC supper Blain Pais, MD        Allergies: Allergies as of 02/17/2014  . (No Known Allergies)   Past Medical History  Diagnosis Date  . DVT (deep venous thrombosis) 09/2002    patient reports additional DVTs in '06 & '11 (unconfirmed)  . Pulmonary embolism 09/2002    treated with 6 months of warfarin  . Hypertension   . Chest pain, neg MI, normal coronaries by cath 02/18/2013  . Hyperlipidemia 02/19/2013  . Acute venous embolism and thrombosis of deep vessels of proximal lower extremity 07/19/2011  . Type I diabetes mellitus dx'd 2001  . GERD (gastroesophageal reflux disease)   . Diabetic ulcer of right foot   . CKD (chronic kidney disease) stage 3, GFR 30-59 ml/min 02/19/2013  . Nephrotic syndrome   . Obesity     BMI 44, weight 346 pounds 01/30/14   Past Surgical History  Procedure Laterality Date  . Incision and drainage abscess  2007; 2015    "back"  . Cardiac catheterization  02/18/2013    normal coronaries  . I&d extremity Right 12/14/2013    Procedure: IRRIGATION AND DEBRIDEMENT RIGHT FOOT;  Surgeon: [name], MD;  Location: [place];  Service: Orthopedics;  Laterality: Right;  . Left heart catheterization with coronary angiogram N/A 02/18/2013    Procedure: LEFT HEART CATHETERIZATION WITH CORONARY ANGIOGRAM;  Surgeon: [name], MD;  Location: [facility] CATH LAB;  Service: Cardiovascular;  Laterality: N/A;  . Amputation Right 12/22/2013    Procedure: AMPUTATION BELOW KNEE;  Surgeon: [name], MD;  Location: [place];  Service: Orthopedics;  Laterality: Right;  . Incision and drainage of wound Right 12/22/2013    Procedure: I&D  RIGHT BUTTOCK;  Surgeon: [name], MD;  Location: [place];  Service: Orthopedics;  Laterality: Right;   Family History  Problem Relation Age of Onset  . Heart disease    . Diabetes    . Obesity     History   Social History  . Marital Status: Single    Spouse Name: N/A  . Number of Children: N/A  . Years of Education: 15.5   Occupational History  . [employer]   . student   .     Social History Main Topics  . Smoking status: Never Smoker   . Smokeless tobacco: Never Used  . Alcohol Use: No     Comment: 12/09/2013 "a few beers q 6 months or so"  . Drug Use: No  . Sexual Activity: No   Other Topics Concern  . Not on file   Social History Narrative   Financial assistance approved for 100% discount at [facility] and has [facility] card per [employer]   09/01/2009.      Lives in [place] with mother and sister.                Review of Systems:  Review of Systems  Constitutional: Negative for fever and chills.  HENT: Negative for sore throat and tinnitus.   Eyes: Negative for blurred vision, double vision and photophobia.  Respiratory: Positive for cough. Negative for hemoptysis, sputum production, shortness of breath and wheezing.        Dry cough.  Cardiovascular: Negative for chest pain, palpitations and leg swelling.  Gastrointestinal: Negative for heartburn, nausea, vomiting and abdominal pain.  Genitourinary: Negative for dysuria, urgency, hematuria and flank pain.  Musculoskeletal: Negative for back pain, joint pain, falls and neck pain.       Has right leg s/p BKA pain.  Skin: Negative for rash.  Neurological: Negative for dizziness, tingling, seizures, loss of consciousness, weakness and headaches.  Endo/Heme/Allergies: Negative.   Psychiatric/Behavioral: Negative.      Physical Exam: Blood pressure 126/92, pulse 88,  temperature 98.9 F (37.2 C), temperature source Oral, resp. rate 22, SpO2 100 %. Physical Exam  Constitutional: He is oriented to person, place, and time. He appears well-developed and well-nourished.  Obese male. Calm, cooperative.   HENT:  Head: Normocephalic and atraumatic.  Right Ear: External ear normal.  Left Ear: External ear normal.  Eyes: Conjunctivae and EOM are normal. Pupils are equal, round, and reactive to light. Right eye exhibits no discharge. Left eye exhibits no discharge. No scleral icterus.  Neck: Normal range of motion. Neck supple. No JVD present.  Cardiovascular: Normal rate and regular rhythm.  Exam reveals no gallop and no friction rub.   No murmur heard. Respiratory: Effort normal and breath sounds normal. No stridor. No respiratory distress. He has no wheezes. He has no rales. He exhibits no tenderness.  GI: Soft. Bowel sounds are normal. He exhibits no distension and no mass. There is no tenderness. There is no rebound and no guarding.  Musculoskeletal: He exhibits edema and tenderness.  Right leg s/p BKA, stump covered with dressing. We did not undress it as it has open wound underneath. Will let ortho look at the dressing tomorrow. Has TTP on the stump.   Left leg 2+ pitting edema. This is chronic per patient, had DVTs on this leg.  Lymphadenopathy:    He has no cervical adenopathy.  Neurological: He is alert and oriented to person, place, and time. He has normal reflexes. He displays normal reflexes.  No cranial nerve deficit. Coordination normal.     Lab results: Basic Metabolic Panel: No results for input(s): NA, K, CL, CO2, GLUCOSE, BUN, CREATININE, CALCIUM, MG, PHOS in the last 72 hours. Liver Function Tests: No results for input(s): AST, ALT, ALKPHOS, BILITOT, PROT, ALBUMIN in the last 72 hours. No results for input(s): LIPASE, AMYLASE in the last 72 hours. No results for input(s): AMMONIA in the last 72 hours. CBC: No results for input(s): WBC,  NEUTROABS, HGB, HCT, MCV, PLT in the last 72 hours. Cardiac Enzymes: No results for input(s): CKTOTAL, CKMB, CKMBINDEX, TROPONINI in the last 72 hours. BNP: No results for input(s): PROBNP in the last 72 hours. D-Dimer: No results for input(s): DDIMER in the last 72 hours. CBG: No results for input(s): GLUCAP in the last 72 hours. Hemoglobin A1C: No results for input(s): HGBA1C in the last 72 hours. Fasting Lipid Panel: No results for input(s): CHOL, HDL, LDLCALC, TRIG, CHOLHDL, LDLDIRECT in the last 72 hours. Thyroid Function Tests: No results for input(s): TSH, T4TOTAL, FREET4, T3FREE, THYROIDAB in the last 72 hours. Anemia Panel: No results for input(s): VITAMINB12, FOLATE, FERRITIN, TIBC, IRON, RETICCTPCT in the last 72 hours. Coagulation: No results for input(s): LABPROT, INR in the last 72 hours. Urine Drug Screen: Drugs of Abuse     Component Value Date/Time   LABOPIA POSITIVE* 07/12/2011 0038   COCAINSCRNUR NONE DETECTED 07/12/2011 0038   LABBENZ NONE DETECTED 07/12/2011 0038   AMPHETMU NONE DETECTED 07/12/2011 0038   THCU NONE DETECTED 07/12/2011 0038   LABBARB NONE DETECTED 07/12/2011 0038    Alcohol Level: No results for input(s): ETH in the last 72 hours. Urinalysis: No results for input(s): COLORURINE, LABSPEC, PHURINE, GLUCOSEU, HGBUR, BILIRUBINUR, KETONESUR, PROTEINUR, UROBILINOGEN, NITRITE, LEUKOCYTESUR in the last 72 hours.  Invalid input(s): APPERANCEUR Misc. Labs:  Imaging results:  No results found.  Other results: EKG: none  Assessment & Plan by Problem: Principal Problem:   Wound infection Active Problems:   DM (diabetes mellitus) type I uncontrolled, periph vascular disorder   Long term current use of anticoagulant therapy   Essential hypertension   CKD stage 3 due to type 1 diabetes mellitus  39 yo male with DM type I, CKD stage 3, HTN, DVT/PE hx on eliquis, here transferred from [facility] for need for right BKA revision to AKA by ortho  due to wound dehiscence.  Right leg s/p BKA on 12/22/2013 by Dr. Doran Durand - having wound dehiscence and hematoma.    Had open wound debrided by Dr. Chauncey Mann at [facility] on 02/12/14 with evacuation of hematoma. He recommended revision of BKA to AKA as there is not enough viable bone to support his muscle currently.  - he is not febrile and hasn't been febrile during his admission at [facility]. However, did get few doses of vanc (unclear how many). He had WBC of 12.3 there but otherwise no sings of infection.   -will not give abx here as afebrile and no signs of sepsis.  -need to call Dr. Doran Durand in AM for evaluation for revision of his BKA to AKA. Keeping NPO for now in case he goes to OR. Dressing changes and wound care would be per Dr. Doran Durand. We did not take off his dressing. - pain control with morphine  Hx of DVT on left leg and PE - was on warfarin in the past but was switched to eliquis as his INR was hard to control. Last elilquis dose is unknown. - will hold eliquis for now. Will  ask pharmacy team to find out the last dose. Should be cleared from his system by 24 hours. - will do heparin gtt for now while off eliquis. - has chronic leg swelling on left leg. Will continue lasix for his.  Chronic Anemia of chronic disease -stable. low iron, high ferritin. hgb 9.1 currently, his baseline is 8-9. He was transfused 2 units of blood on 02/12/14 at [facility] for symptomatic anemia with hgb 9 and SOB. His SOB improved with the transfusion. Currently he is no longer SOB or having any other symptoms of anemia. - stable. Cont to monitor.  CKD stage 3 - with hx of nephrotic syndrome. CKD stable around baseline. baseline crt 2.1-2.3 in the past. Last crt 1.39 in [place] [facility], in [place] it was 1.44 and then last 1.38. - monitor kidney function. - cont lasix.  Urinary retention - is on flomax. Had some problem with retention post op on 02/12/14 after debriment. But now doing ok per patient. - cont flomax.  Bladder scan to make sure he is not retaining. Monitor UOP.  DM I - was on 70/30 10 u BID at [facility]. lantus was d/ced due to hypoglycemia at [facility]. - will do SSI here.  HTN - norvasc, coreg, lasix, lisinopril at home. - hold lisinopril for now. Cont lasix, coreg, norvasc.   Neuropathic pain - cont nortryptyline, gabapentin.  HLD - cont pravastatin. GERD - cont protonix  Constipation - cont sennokot.   NPO for now. Heparin gtt for DVT treatment. FULL code.  Dispo: Disposition is deferred at this time, awaiting improvement of current medical problems. Anticipated discharge in approximately 1-2 day(s).   The patient does have a current PCP Lucious Groves, DO) and does need an [facility] hospital follow-up appointment after discharge.  The patient does have transportation limitations that hinder transportation to clinic appointments.  Signed: Dellia Nims, MD 02/18/2014, 3:38 AM

## 2014-02-19 ENCOUNTER — Encounter (HOSPITAL_COMMUNITY): Payer: Self-pay | Admitting: Certified Registered"

## 2014-02-19 ENCOUNTER — Inpatient Hospital Stay (HOSPITAL_COMMUNITY): Payer: Medicaid Other | Admitting: Certified Registered"

## 2014-02-19 ENCOUNTER — Encounter (HOSPITAL_COMMUNITY)
Admission: AD | Disposition: A | Discharge status: Skilled Nursing Facility | Payer: Self-pay | Source: Other Acute Inpatient Hospital | Attending: Internal Medicine

## 2014-02-19 DIAGNOSIS — I129 Hypertensive chronic kidney disease with stage 1 through stage 4 chronic kidney disease, or unspecified chronic kidney disease: Secondary | ICD-10-CM

## 2014-02-19 DIAGNOSIS — N183 Chronic kidney disease, stage 3 (moderate): Secondary | ICD-10-CM

## 2014-02-19 DIAGNOSIS — T8753 Necrosis of amputation stump, right lower extremity: Secondary | ICD-10-CM

## 2014-02-19 DIAGNOSIS — K5900 Constipation, unspecified: Secondary | ICD-10-CM

## 2014-02-19 DIAGNOSIS — T8781 Dehiscence of amputation stump: Principal | ICD-10-CM

## 2014-02-19 DIAGNOSIS — D638 Anemia in other chronic diseases classified elsewhere: Secondary | ICD-10-CM

## 2014-02-19 DIAGNOSIS — K219 Gastro-esophageal reflux disease without esophagitis: Secondary | ICD-10-CM

## 2014-02-19 DIAGNOSIS — E119 Type 2 diabetes mellitus without complications: Secondary | ICD-10-CM

## 2014-02-19 DIAGNOSIS — E785 Hyperlipidemia, unspecified: Secondary | ICD-10-CM

## 2014-02-19 HISTORY — PX: AMPUTATION: SHX166

## 2014-02-19 LAB — BASIC METABOLIC PANEL
Anion gap: 6 (ref 5–15)
BUN: 20 mg/dL (ref 6–23)
CALCIUM: 8.6 mg/dL (ref 8.4–10.5)
CO2: 27 mmol/L (ref 19–32)
Chloride: 105 mmol/L (ref 96–112)
Creatinine, Ser: 1.15 mg/dL (ref 0.50–1.35)
GFR, EST NON AFRICAN AMERICAN: 80 mL/min — AB (ref >90)
Glucose, Bld: 130 mg/dL — ABNORMAL HIGH (ref 70–99)
POTASSIUM: 3.8 mmol/L (ref 3.5–5.1)
SODIUM: 138 mmol/L (ref 135–145)

## 2014-02-19 LAB — CBC
HCT: 28.8 % — ABNORMAL LOW (ref 39.0–52.0)
Hemoglobin: 9.3 g/dL — ABNORMAL LOW (ref 13.0–17.0)
MCH: 27 pg (ref 26.0–34.0)
MCHC: 32.3 g/dL (ref 30.0–36.0)
MCV: 83.7 fL (ref 78.0–100.0)
PLATELETS: 505 10*3/uL — AB (ref 150–400)
RBC: 3.44 MIL/uL — AB (ref 4.22–5.81)
RDW: 14.7 % (ref 11.5–15.5)
WBC: 13 10*3/uL — AB (ref 4.0–10.5)

## 2014-02-19 LAB — GLUCOSE, CAPILLARY
GLUCOSE-CAPILLARY: 136 mg/dL — AB (ref 70–99)
GLUCOSE-CAPILLARY: 162 mg/dL — AB (ref 70–99)
GLUCOSE-CAPILLARY: 161 mg/dL — AB (ref 70–99)
GLUCOSE-CAPILLARY: 182 mg/dL — AB (ref 70–99)
Glucose-Capillary: 112 mg/dL — ABNORMAL HIGH (ref 70–99)
Glucose-Capillary: 156 mg/dL — ABNORMAL HIGH (ref 70–99)
Glucose-Capillary: 186 mg/dL — ABNORMAL HIGH (ref 70–99)

## 2014-02-19 LAB — LIPID PANEL
CHOL/HDL RATIO: 4.1 ratio
CHOLESTEROL: 94 mg/dL (ref 0–200)
HDL: 23 mg/dL — AB (ref >39)
LDL Cholesterol: 53 mg/dL (ref 0–99)
TRIGLYCERIDES: 91 mg/dL (ref <150)
VLDL: 18 mg/dL (ref 0–40)

## 2014-02-19 LAB — APTT: APTT: 108 s — AB (ref 24–37)

## 2014-02-19 LAB — GAMMA GT: GGT: 99 U/L — AB (ref 7–51)

## 2014-02-19 LAB — HEPARIN LEVEL (UNFRACTIONATED): Heparin Unfractionated: 0.78 IU/mL — ABNORMAL HIGH (ref 0.30–0.70)

## 2014-02-19 SURGERY — AMPUTATION, ABOVE KNEE
Anesthesia: General | Site: Knee | Laterality: Right

## 2014-02-19 MED ORDER — MIDAZOLAM HCL 2 MG/2ML IJ SOLN
INTRAMUSCULAR | Status: AC
  Filled 2014-02-19: qty 2

## 2014-02-19 MED ORDER — HYDROMORPHONE HCL 1 MG/ML IJ SOLN
0.5000 mg | INTRAMUSCULAR | Status: DC | PRN
  Administered 2014-02-20: 1 mg via INTRAVENOUS
  Administered 2014-02-20: 0.5 mg via INTRAVENOUS
  Filled 2014-02-19 (×2): qty 1

## 2014-02-19 MED ORDER — METOCLOPRAMIDE HCL 10 MG PO TABS: 5.0000 mg | ORAL_TABLET | Freq: Three times a day (TID) | ORAL | Status: DC | PRN

## 2014-02-19 MED ORDER — ROCURONIUM BROMIDE 50 MG/5ML IV SOLN
INTRAVENOUS | Status: AC
  Filled 2014-02-19: qty 1

## 2014-02-19 MED ORDER — LIDOCAINE HCL (CARDIAC) 20 MG/ML IV SOLN
INTRAVENOUS | Status: AC
  Filled 2014-02-19: qty 5

## 2014-02-19 MED ORDER — PROMETHAZINE HCL 25 MG/ML IJ SOLN: 6.2500 mg | INTRAMUSCULAR | Status: DC | PRN

## 2014-02-19 MED ORDER — PROPOFOL 10 MG/ML IV BOLUS
INTRAVENOUS | Status: DC | PRN
  Administered 2014-02-19: 200 mg via INTRAVENOUS

## 2014-02-19 MED ORDER — SODIUM CHLORIDE 0.9 % IV SOLN
INTRAVENOUS | Status: DC
  Administered 2014-02-19: 10 mL/h via INTRAVENOUS

## 2014-02-19 MED ORDER — HYDROMORPHONE HCL 1 MG/ML IJ SOLN
INTRAMUSCULAR | Status: AC
  Filled 2014-02-19: qty 1

## 2014-02-19 MED ORDER — FENTANYL CITRATE 0.05 MG/ML IJ SOLN
INTRAMUSCULAR | Status: AC
  Filled 2014-02-19: qty 5

## 2014-02-19 MED ORDER — VANCOMYCIN HCL IN DEXTROSE 1-5 GM/200ML-% IV SOLN
1000.0000 mg | Freq: Two times a day (BID) | INTRAVENOUS | Status: AC
  Administered 2014-02-20: 1000 mg via INTRAVENOUS
  Filled 2014-02-19: qty 200

## 2014-02-19 MED ORDER — SODIUM CHLORIDE 0.9 % IV SOLN
INTRAVENOUS | Status: DC
  Administered 2014-02-19 – 2014-02-22 (×6): via INTRAVENOUS

## 2014-02-19 MED ORDER — 0.9 % SODIUM CHLORIDE (POUR BTL) OPTIME
TOPICAL | Status: DC | PRN
  Administered 2014-02-19: 1000 mL

## 2014-02-19 MED ORDER — METOCLOPRAMIDE HCL 5 MG/ML IJ SOLN
5.0000 mg | Freq: Three times a day (TID) | INTRAMUSCULAR | Status: DC | PRN
  Filled 2014-02-19: qty 2

## 2014-02-19 MED ORDER — MEPERIDINE HCL 25 MG/ML IJ SOLN: 6.2500 mg | INTRAMUSCULAR | Status: DC | PRN

## 2014-02-19 MED ORDER — MIDAZOLAM HCL 5 MG/5ML IJ SOLN
INTRAMUSCULAR | Status: DC | PRN
  Administered 2014-02-19: 2 mg via INTRAVENOUS

## 2014-02-19 MED ORDER — PROPOFOL 10 MG/ML IV BOLUS
INTRAVENOUS | Status: AC
  Filled 2014-02-19: qty 20

## 2014-02-19 MED ORDER — OXYCODONE HCL 5 MG PO TABS
ORAL_TABLET | ORAL | Status: AC
  Filled 2014-02-19: qty 3

## 2014-02-19 MED ORDER — ONDANSETRON HCL 4 MG/2ML IJ SOLN
INTRAMUSCULAR | Status: DC | PRN
  Administered 2014-02-19: 4 mg via INTRAVENOUS

## 2014-02-19 MED ORDER — FENTANYL CITRATE 0.05 MG/ML IJ SOLN
INTRAMUSCULAR | Status: DC | PRN
  Administered 2014-02-19: 100 ug via INTRAVENOUS
  Administered 2014-02-19: 150 ug via INTRAVENOUS
  Administered 2014-02-19 (×3): 50 ug via INTRAVENOUS
  Administered 2014-02-19: 100 ug via INTRAVENOUS

## 2014-02-19 MED ORDER — LIDOCAINE HCL (CARDIAC) 20 MG/ML IV SOLN
INTRAVENOUS | Status: DC | PRN
  Administered 2014-02-19: 25 mg via INTRAVENOUS
  Administered 2014-02-19: 75 mg via INTRAVENOUS

## 2014-02-19 MED ORDER — SUCCINYLCHOLINE CHLORIDE 20 MG/ML IJ SOLN
INTRAMUSCULAR | Status: DC | PRN
Start: 2014-02-19 — End: 2014-02-19
  Administered 2014-02-19: 100 mg via INTRAVENOUS

## 2014-02-19 MED ORDER — HYDROMORPHONE HCL 1 MG/ML IJ SOLN
0.2500 mg | INTRAMUSCULAR | Status: DC | PRN
  Administered 2014-02-19 (×2): 0.5 mg via INTRAVENOUS
  Administered 2014-02-19: 1 mg via INTRAVENOUS

## 2014-02-19 SURGICAL SUPPLY — 61 items
BANDAGE ESMARK 6X9 LF (GAUZE/BANDAGES/DRESSINGS) ×1 IMPLANT
BLADE SAW RECIP 87.9 MT (BLADE) ×2 IMPLANT
BNDG CMPR 9X6 STRL LF SNTH (GAUZE/BANDAGES/DRESSINGS) ×1
BNDG CMPR MED 15X6 ELC VLCR LF (GAUZE/BANDAGES/DRESSINGS) ×1
BNDG COHESIVE 6X5 TAN STRL LF (GAUZE/BANDAGES/DRESSINGS) IMPLANT
BNDG ELASTIC 6X15 VLCR STRL LF (GAUZE/BANDAGES/DRESSINGS) ×2 IMPLANT
BNDG ESMARK 6X9 LF (GAUZE/BANDAGES/DRESSINGS) ×2
CANISTER SUCT 3000ML PPV (MISCELLANEOUS) ×2 IMPLANT
CHLORAPREP W/TINT 10.5 ML (MISCELLANEOUS) ×2 IMPLANT
COVER SURGICAL LIGHT HANDLE (MISCELLANEOUS) ×2 IMPLANT
CUFF TOURNIQUET SINGLE 34IN LL (TOURNIQUET CUFF) IMPLANT
CUFF TOURNIQUET SINGLE 44IN (TOURNIQUET CUFF) IMPLANT
DRAIN PENROSE 1/2X12 LTX STRL (WOUND CARE) IMPLANT
DRAPE EXTREMITY T 121X128X90 (DRAPE) ×2 IMPLANT
DRAPE INCISE IOBAN 66X45 STRL (DRAPES) ×2 IMPLANT
DRAPE PROXIMA HALF (DRAPES) ×4 IMPLANT
DRAPE U-SHAPE 47X51 STRL (DRAPES) ×4 IMPLANT
DRSG ADAPTIC 3X8 NADH LF (GAUZE/BANDAGES/DRESSINGS) ×4 IMPLANT
DRSG MEPITEL 4X7.2 (GAUZE/BANDAGES/DRESSINGS) ×4 IMPLANT
DRSG PAD ABDOMINAL 8X10 ST (GAUZE/BANDAGES/DRESSINGS) ×4 IMPLANT
ELECT REM PT RETURN 9FT ADLT (ELECTROSURGICAL) ×2
ELECTRODE REM PT RTRN 9FT ADLT (ELECTROSURGICAL) ×1 IMPLANT
EVACUATOR 1/8 PVC DRAIN (DRAIN) IMPLANT
GAUZE SPONGE 4X4 12PLY STRL (GAUZE/BANDAGES/DRESSINGS) ×6 IMPLANT
GLOVE BIO SURGEON STRL SZ7 (GLOVE) ×4 IMPLANT
GLOVE BIO SURGEON STRL SZ8 (GLOVE) ×4 IMPLANT
GLOVE BIOGEL PI IND STRL 7.5 (GLOVE) ×1 IMPLANT
GLOVE BIOGEL PI IND STRL 8 (GLOVE) ×1 IMPLANT
GLOVE BIOGEL PI INDICATOR 7.5 (GLOVE) ×1
GLOVE BIOGEL PI INDICATOR 8 (GLOVE) ×1
GOWN STRL REUS W/ TWL LRG LVL3 (GOWN DISPOSABLE) ×1 IMPLANT
GOWN STRL REUS W/ TWL XL LVL3 (GOWN DISPOSABLE) ×1 IMPLANT
GOWN STRL REUS W/TWL LRG LVL3 (GOWN DISPOSABLE) ×2
GOWN STRL REUS W/TWL XL LVL3 (GOWN DISPOSABLE) ×2
KIT BASIN OR (CUSTOM PROCEDURE TRAY) ×2 IMPLANT
KIT ROOM TURNOVER OR (KITS) ×2 IMPLANT
NS IRRIG 1000ML POUR BTL (IV SOLUTION) ×2 IMPLANT
PACK GENERAL/GYN (CUSTOM PROCEDURE TRAY) ×2 IMPLANT
PAD ABD 8X10 STRL (GAUZE/BANDAGES/DRESSINGS) ×4 IMPLANT
PAD ARMBOARD 7.5X6 YLW CONV (MISCELLANEOUS) ×4 IMPLANT
PADDING CAST ABS 6INX4YD NS (CAST SUPPLIES) ×1
PADDING CAST ABS COTTON 6X4 NS (CAST SUPPLIES) ×1 IMPLANT
SPONGE LAP 18X18 X RAY DECT (DISPOSABLE) ×2 IMPLANT
STAPLER VISISTAT 35W (STAPLE) ×2 IMPLANT
STOCKINETTE IMPERVIOUS LG (DRAPES) ×2 IMPLANT
SUT ETHILON 2 0 FS 18 (SUTURE) ×8 IMPLANT
SUT MON AB 2-0 CT1 36 (SUTURE) ×4 IMPLANT
SUT PDS AB 1 CT  36 (SUTURE)
SUT PDS AB 1 CT 36 (SUTURE) IMPLANT
SUT PDS AB 1 CTX 36 (SUTURE) ×2 IMPLANT
SUT SILK 2 0 (SUTURE) ×2
SUT SILK 2-0 18XBRD TIE 12 (SUTURE) ×1 IMPLANT
SUT VIC AB 1 CTX 36 (SUTURE) ×2
SUT VIC AB 1 CTX36XBRD ANBCTR (SUTURE) ×1 IMPLANT
SUT VIC AB 2-0 CT1 27 (SUTURE) ×2
SUT VIC AB 2-0 CT1 TAPERPNT 27 (SUTURE) ×1 IMPLANT
SWAB COLLECTION DEVICE MRSA (MISCELLANEOUS) IMPLANT
TOWEL OR 17X24 6PK STRL BLUE (TOWEL DISPOSABLE) ×2 IMPLANT
TOWEL OR 17X26 10 PK STRL BLUE (TOWEL DISPOSABLE) ×2 IMPLANT
TUBE ANAEROBIC SPECIMEN COL (MISCELLANEOUS) IMPLANT
WATER STERILE IRR 1000ML POUR (IV SOLUTION) ×2 IMPLANT

## 2014-02-19 NOTE — Anesthesia Procedure Notes (Signed)
Procedure Name: Intubation Date/Time: 02/19/2014 4:36 PM Performed by: Melina Copa, Payson Crumby R Pre-anesthesia Checklist: Patient identified, Emergency Drugs available, Suction available, Patient being monitored and Timeout performed Patient Re-evaluated:Patient Re-evaluated prior to inductionOxygen Delivery Method: Circle system utilized Preoxygenation: Pre-oxygenation with 100% oxygen Intubation Type: IV induction Ventilation: Mask ventilation without difficulty Laryngoscope Size: Mac and 4 Grade View: Grade II Tube type: Oral Tube size: 8.0 mm Number of attempts: 1 Airway Equipment and Method: Stylet Placement Confirmation: ETT inserted through vocal cords under direct vision,  positive ETCO2 and breath sounds checked- equal and bilateral Secured at: 22 cm Tube secured with: Tape Dental Injury: Teeth and Oropharynx as per pre-operative assessment

## 2014-02-19 NOTE — Anesthesia Preprocedure Evaluation (Signed)
Anesthesia Evaluation  Patient identified by MRN, date of birth, ID band Patient awake    Reviewed: Allergy & Precautions, H&P , NPO status , Patient's Chart, lab work & pertinent test results, reviewed documented beta blocker date and time   Airway Mallampati: II  TM Distance: >3 FB Neck ROM: Full    Dental  (+) Dental Advisory Given, Teeth Intact   Pulmonary    + decreased breath sounds      Cardiovascular hypertension, Pt. on home beta blockers and Pt. on medications + Peripheral Vascular Disease Rhythm:Regular Rate:Normal     Neuro/Psych    GI/Hepatic GERD-  ,  Endo/Other  diabetes, Type 2, Oral Hypoglycemic AgentsMorbid obesity  Renal/GU Renal InsufficiencyRenal disease     Musculoskeletal   Abdominal (+) + obese,   Peds  Hematology  (+) anemia ,   Anesthesia Other Findings   Reproductive/Obstetrics                             Anesthesia Physical  Anesthesia Plan  ASA: III  Anesthesia Plan: General   Post-op Pain Management:    Induction: Intravenous  Airway Management Planned: Oral ETT and LMA  Additional Equipment:   Intra-op Plan:   Post-operative Plan: Extubation in OR  Informed Consent: I have reviewed the patients History and Physical, chart, labs and discussed the procedure including the risks, benefits and alternatives for the proposed anesthesia with the patient or authorized representative who has indicated his/her understanding and acceptance.   Dental advisory given  Plan Discussed with: Anesthesiologist, Surgeon and CRNA  Anesthesia Plan Comments:         Anesthesia Quick Evaluation

## 2014-02-19 NOTE — Progress Notes (Signed)
ANTICOAGULATION CONSULT NOTE - Initial Consult  Pharmacy Consult for Heparin  Indication: h/o PE/DVT   No Known Allergies  Patient Measurements: Height: 6\' 2"  (188 cm) Weight: (!) 332 lb 8 oz (150.821 kg) IBW/kg (Calculated) : 82.2 Heparin Dosing Weight: 117 kg   Vital Signs: Temp: 99.5 F (37.5 C) (02/10 2052) Temp Source: Oral (02/10 2052) BP: 136/57 mmHg (02/10 2052) Pulse Rate: 91 (02/10 2052)  Labs:  Recent Labs  02/18/14 0411 02/18/14 0513 02/18/14 0857 02/18/14 1400 02/19/14 0049 02/19/14 0050  HGB 9.1*  --   --   --  9.3*  --   HCT 28.6*  --   --   --  28.8*  --   PLT 474*  --   --   --  505*  --   APTT  --   --  57* 68*  --  108*  HEPARINUNFRC  --  0.90*  --  1.02*  --  0.78*  CREATININE 1.31  --   --   --  1.15  --     Estimated Creatinine Clearance: 136.3 mL/min (by C-G formula based on Cr of 1.15).   Medical History: Past Medical History  Diagnosis Date  . DVT (deep venous thrombosis) 09/2002    patient reports additional DVTs in '06 & '11 (unconfirmed)  . Pulmonary embolism 09/2002    treated with 6 months of warfarin  . Hypertension   . Chest pain, neg MI, normal coronaries by cath 02/18/2013  . Hyperlipidemia 02/19/2013  . Acute venous embolism and thrombosis of deep vessels of proximal lower extremity 07/19/2011  . Type I diabetes mellitus dx'd 2001  . GERD (gastroesophageal reflux disease)   . Diabetic ulcer of right foot   . CKD (chronic kidney disease) stage 3, GFR 30-59 ml/min 02/19/2013  . Nephrotic syndrome   . Obesity     BMI 44, weight 346 pounds 01/30/14    Medications:  Prescriptions prior to admission  Medication Sig Dispense Refill Last Dose  . amLODipine (NORVASC) 10 MG tablet Take 1 tablet (10 mg total) by mouth daily. 30 tablet 1 02/17/2014 at Unknown time  . apixaban (ELIQUIS) 5 MG TABS tablet Take 1 tablet (5 mg total) by mouth 2 (two) times daily. 60 tablet 0 unknown  . calcium carbonate (TUMS - DOSED IN MG ELEMENTAL CALCIUM)  500 MG chewable tablet Chew 1 tablet (200 mg of elemental calcium total) by mouth 3 (three) times daily with meals. 100 tablet 1 02/17/2014 at Unknown time  . carvedilol (COREG) 25 MG tablet Take 1 tablet (25 mg total) by mouth 2 (two) times daily with a meal. 30 tablet 0 02/17/2014 at 1839  . escitalopram (LEXAPRO) 20 MG tablet Take 1 tablet (20 mg total) by mouth at bedtime. 30 tablet 1 02/16/2014  . furosemide (LASIX) 40 MG tablet Take 1 tablet (40 mg total) by mouth daily. 30 tablet 1 02/17/2014 at Unknown time  . gabapentin (NEURONTIN) 300 MG capsule Take 1 capsule (300 mg total) by mouth 3 (three) times daily. 90 capsule 1 02/17/2014 at Unknown time  . insulin NPH-regular Human (NOVOLIN 70/30) (70-30) 100 UNIT/ML injection Inject 10 Units into the skin 2 (two) times daily with a meal. 10 mL 11 02/17/2014 at Unknown time  . lisinopril (PRINIVIL,ZESTRIL) 10 MG tablet Take 1 tablet (10 mg total) by mouth daily. 30 tablet 0 02/17/2014 at Unknown time  . lovastatin (MEVACOR) 20 MG tablet Take 1 tablet (20 mg total) by mouth daily. 30 tablet  11 unknown  . morphine (MS CONTIN) 15 MG 12 hr tablet Take 15 mg by mouth 2 (two) times daily.   02/17/2014 at Unknown time  . Multiple Vitamin (MULTIVITAMIN WITH MINERALS) TABS tablet Take 1 tablet by mouth daily. 30 tablet 1 02/17/2014 at Unknown time  . nortriptyline (PAMELOR) 10 MG capsule Take 1 capsule (10 mg total) by mouth at bedtime. 30 capsule 1 02/16/2014  . omeprazole (PRILOSEC) 20 MG capsule Take 20 mg by mouth 2 (two) times daily before a meal.   02/17/2014 at Unknown time  . oxyCODONE (ROXICODONE) 15 MG immediate release tablet Take 1 tablet (15 mg total) by mouth every 6 (six) hours as needed. 120 tablet 0 02/16/2014  . pantoprazole (PROTONIX) 40 MG tablet Take 1 tablet (40 mg total) by mouth 2 (two) times daily. 60 tablet 1 unknown  . senna-docusate (SENOKOT-S) 8.6-50 MG per tablet Take 2 tablets by mouth 2 (two) times daily. 120 tablet 1 02/17/2014 at Unknown time  .  tamsulosin (FLOMAX) 0.4 MG CAPS capsule Take 1 capsule (0.4 mg total) by mouth daily after supper. (Patient taking differently: Take 0.4 mg by mouth daily. ) 30 capsule 1 02/17/2014 at Unknown time  . acetaminophen (TYLENOL) 325 MG tablet Take 1-2 tablets (325-650 mg total) by mouth every 4 (four) hours as needed for mild pain. (Patient not taking: Reported on 02/18/2014)   Not Taking at Unknown time  . ALPRAZolam (XANAX) 0.25 MG tablet Take 1 tablet (0.25 mg total) by mouth 2 (two) times daily as needed for anxiety. (Patient not taking: Reported on 02/18/2014) 30 tablet 0 Not Taking at Unknown time  . methocarbamol (ROBAXIN) 500 MG tablet Take 1 tablet (500 mg total) by mouth every 6 (six) hours as needed for muscle spasms. (Patient not taking: Reported on 02/18/2014) 90 tablet 1 Not Taking at Unknown time    Assessment: 38 YOM on Eliquis prior to admission for PE/DVT in 2011 being switched to heparin infusion. Per patient, he has not been on Eliquis for about a week due to continued bleeding from right stump post BKA.. Currently on heparin infusion at 2000 units/hr. APTT and HL are now elevated after rate increase and the values seem to correlate indicating that Eliquis has been fully cleared.   Goal of Therapy:  Heparin level 0.3-0.7 units/ml Monitor platelets by anticoagulation protocol: Yes   Plan:  -Decrease heparin to 1950 units/hr -F/u 6 hr HL. Stop aPTT monitoring  -Monitor CBC, daily HL and s/s of bleeding from stump   Albertina Parr, PharmD., BCPS Clinical Pharmacist Pager [phone]

## 2014-02-19 NOTE — Interval H&P Note (Signed)
History and Physical Interval Note:  02/19/2014 2:33 PM  [name]  has presented today for surgery, with the diagnosis of RIGHT LEG WOUND DEHISCENCE  The various methods of treatment have been discussed with the patient and family. After consideration of risks, benefits and other options for treatment, the patient has consented to  Procedure(s): RIGHT ABOVE KNEE AMPUTATION  (Right) as a surgical intervention .  The patient's history has been reviewed, patient examined, no change in status, stable for surgery.  I have reviewed the patient's chart and labs.  Questions were answered to the patient's satisfaction.    The risks and benefits of the alternative treatment options have been discussed in detail.  The patient wishes to proceed with surgery and specifically understands risks of bleeding, infection, nerve damage, blood clots, need for additional surgery, revision amputation and death.  [name]

## 2014-02-19 NOTE — Brief Op Note (Signed)
02/18/2014 - 02/19/2014  6:04 PM  PATIENT:  [name]  39 y.o. male  PRE-OPERATIVE DIAGNOSIS:  RIGHT LEG WOUND DEHISCENCE  POST-OPERATIVE DIAGNOSIS:  RIGHT LEG WOUND DEHISCENCE with tibial osteomyelitis and posterior flap gangrene  Procedure(s): RIGHT ABOVE KNEE AMPUTATION   SURGEON:  [name], MD  ASSISTANT: n/a  ANESTHESIA:   General  EBL:  minimal   TOURNIQUET:   Total Tourniquet Time Documented: Thigh (Right) - 59 minutes Total: Thigh (Right) - 59 minutes   COMPLICATIONS:  None apparent  DISPOSITION:  Extubated, awake and stable to recovery.  DICTATION ID:  [id number]

## 2014-02-19 NOTE — Anesthesia Postprocedure Evaluation (Signed)
  Anesthesia Post-op Note  Patient: [name]  Procedure(s) Performed: Procedure(s): RIGHT ABOVE KNEE AMPUTATION  (Right)  Patient Location: PACU  Anesthesia Type:General  Level of Consciousness: awake and alert   Airway and Oxygen Therapy: Patient Spontanous Breathing  Post-op Pain: mild  Post-op Assessment: Post-op Vital signs reviewed, Patient's Cardiovascular Status Stable and Respiratory Function Stable  Post-op Vital Signs: Reviewed  Filed Vitals:   02/19/14 1918  BP: 143/67  Pulse: 96  Temp:   Resp: 11    Complications: No apparent anesthesia complications

## 2014-02-19 NOTE — Transfer of Care (Signed)
Immediate Anesthesia Transfer of Care Note  Patient: [name]  Procedure(s) Performed: Procedure(s): RIGHT ABOVE KNEE AMPUTATION  (Right)  Patient Location: PACU  Anesthesia Type:General  Level of Consciousness: awake, alert  and oriented  Airway & Oxygen Therapy: Patient Spontanous Breathing and Patient connected to nasal cannula oxygen  Post-op Assessment: Report given to RN, Post -op Vital signs reviewed and stable and Patient moving all extremities  Post vital signs: Reviewed and stable  Last Vitals:  Filed Vitals:   02/19/14 1311  BP: 149/75  Pulse: 95  Temp: 36.9 C  Resp: 20    Complications: No apparent anesthesia complications

## 2014-02-19 NOTE — Progress Notes (Signed)
Subjective: Patient reserved this morning. He states his IV came out this morning after it got caught and was ripped out while on his way to the restroom. He reports he is having significant pain.   Objective: Vital signs in last 24 hours: Filed Vitals:   02/18/14 0429 02/18/14 1411 02/18/14 2052 02/19/14 0625  BP: 131/61 137/75 136/57 138/71  Pulse: 88 95 91 108  Temp: 98.8 F (37.1 C) 99 F (37.2 C) 99.5 F (37.5 C) 98.9 F (37.2 C)  TempSrc: Oral Oral Oral Oral  Resp:  18 18 15   Height:      Weight:      SpO2: 98% 99% 100% 96%   Weight change:   Intake/Output Summary (Last 24 hours) at 02/19/14 1034 Last data filed at 02/19/14 0940  Gross per 24 hour  Intake    240 ml  Output    350 ml  Net   -110 ml   Physical Exam General: alert, pleasant, NAD HEENT: [place]/AT, EOMI, mucus membranes moist CV: RRR, no m/g/r Pulm: CTA bilaterally, breaths non-labored Abd: BS+, soft, obese, non-tender Ext: R BKA stump covered with ace bandage/dressing. Foul odor present. Left leg has 2+ pitting edema.  Neuro: alert and oriented x 3, no focal deficits  Lab Results: Basic Metabolic Panel:  Recent Labs Lab 02/18/14 0411 02/19/14 0049  NA 139 138  K 3.6 3.8  CL 107 105  CO2 23 27  GLUCOSE 137* 130*  BUN 26* 20  CREATININE 1.31 1.15  CALCIUM 8.6 8.6   Liver Function Tests:  Recent Labs Lab 02/18/14 0411  AST 14  ALT 12  ALKPHOS 165*  BILITOT 0.8  PROT 6.3  ALBUMIN 1.4*   CBC:  Recent Labs Lab 02/18/14 0411 02/19/14 0049  WBC 11.6* 13.0*  HGB 9.1* 9.3*  HCT 28.6* 28.8*  MCV 86.1 83.7  PLT 474* 505*   CBG:  Recent Labs Lab 02/18/14 1202 02/18/14 1605 02/18/14 1940 02/18/14 2350 02/19/14 0334 02/19/14 0805  GLUCAP 157* 156* 128* 116* 112* 136*   Fasting Lipid Panel:  Recent Labs Lab 02/19/14 0050  CHOL 94  HDL 23*  LDLCALC 53  TRIG 91  CHOLHDL 4.1   Medications: I have reviewed the patient's current medications. Scheduled Meds: .  amLODipine  10 mg Oral Daily  . calcium carbonate  1 tablet Oral TID WC  . carvedilol  25 mg Oral BID WC  . escitalopram  20 mg Oral QHS  . furosemide  40 mg Oral Daily  . gabapentin  300 mg Oral TID  . insulin aspart  0-9 Units Subcutaneous 6 times per day  . insulin glargine  5 Units Subcutaneous QHS  . multivitamin with minerals  1 tablet Oral Daily  . nortriptyline  10 mg Oral QHS  . pantoprazole  40 mg Oral BID  . pravastatin  20 mg Oral q1800  . senna-docusate  2 tablet Oral BID  . sodium chloride  3 mL Intravenous Q12H  . tamsulosin  0.4 mg Oral QPC supper  . vancomycin  1,500 mg Intravenous On Call to OR   Continuous Infusions: . sodium chloride 75 mL/hr at 02/18/14 2312   PRN Meds:.sodium chloride, acetaminophen, ALPRAZolam, morphine injection, ondansetron **OR** ondansetron (ZOFRAN) IV, oxyCODONE, sodium chloride Assessment/Plan:  R Leg BKA Wound Dehiscence and Necrosis: Ortho to perform surgery today. Will focus on pain control.  - Ortho following, appreciate recommendations  - NPO for now, carb modified after surgery - Heparin held this AM for  surgery - IV Vanc for surgical prophylaxis  - Dressing changes per ortho  Diabetes Mellitus: Unclear if type 1 or 2. Patient states he has had antibody testing in the past which confirmed type 1. Glutamic acid and anti-islet antibodies ordered. Last HbA1c 6.8 on 01/30/14. Blood sugars well controlled here in 110s-150s.  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 Held due to surgery tomorrow. Placed on heparin gtt. - Heparin held this AM for surgery  Anemia of Chronic Disease: Hbg stable at 9.3 this AM. Baseline is 8-9.  - CBC intermittently   CKD Stage 3: Cr 1.31 on admission, 1.15 this morning. Baseline difficult to assess, ~2.1-2.3 in the past. Will continue to monitor. - bmet in AM - Continue Lasix  Neuropathic Pain:  - Continue Gabapentin 300 mg  TID - Continue Nortriptyline 10 mg daily   HTN: BP stable in Q000111Q systolic. Patient takes Amloidpine, Coreg, Lasix, and Lisinopril at home. - Continue Amlodipine 10 mg daily - Continue Coreg 25 mg BID - Continue Lasix 40 mg daily - Hold Lisinopril for now, can add back if BP becomes elevated   HLD: Lipid panel on 02/19/13 shows Chol 258, Trigly 329, HDL 45, and LDL 147. Patient takes Pravastatin at home. - Continue Pravastatin 20 mg daily   GERD: - Continue Protonix 40 mg BID  Constipation:  - Continue Sennokot  Diet: NPO, carb modified after surgery VTE Ppx: Heparin, held this AM for surgery today Dispo: Disposition is deferred at this time, awaiting improvement of current medical problems.    The patient does have a current PCP Lucious Groves, DO) and does need an [facility] hospital follow-up appointment after discharge.  The patient does not have transportation limitations that hinder transportation to clinic appointments.  .Services Needed at time of discharge: Y = Yes, Blank = No PT:   OT:   RN:   Equipment:   Other:     LOS: 1 day   Albin Felling, MD 02/19/2014, 10:34 AM

## 2014-02-20 ENCOUNTER — Encounter (HOSPITAL_COMMUNITY): Payer: Self-pay | Admitting: Orthopedic Surgery

## 2014-02-20 ENCOUNTER — Encounter: Payer: Self-pay | Attending: Physical Medicine & Rehabilitation | Admitting: Physical Medicine & Rehabilitation

## 2014-02-20 LAB — CBC
HCT: 27.2 % — ABNORMAL LOW (ref 39.0–52.0)
Hemoglobin: 8.7 g/dL — ABNORMAL LOW (ref 13.0–17.0)
MCH: 27.5 pg (ref 26.0–34.0)
MCHC: 32 g/dL (ref 30.0–36.0)
MCV: 86.1 fL (ref 78.0–100.0)
Platelets: 485 10*3/uL — ABNORMAL HIGH (ref 150–400)
RBC: 3.16 MIL/uL — ABNORMAL LOW (ref 4.22–5.81)
RDW: 14.5 % (ref 11.5–15.5)
WBC: 17.1 10*3/uL — ABNORMAL HIGH (ref 4.0–10.5)

## 2014-02-20 LAB — GLUCOSE, CAPILLARY
GLUCOSE-CAPILLARY: 161 mg/dL — AB (ref 70–99)
Glucose-Capillary: 148 mg/dL — ABNORMAL HIGH (ref 70–99)
Glucose-Capillary: 160 mg/dL — ABNORMAL HIGH (ref 70–99)
Glucose-Capillary: 165 mg/dL — ABNORMAL HIGH (ref 70–99)
Glucose-Capillary: 168 mg/dL — ABNORMAL HIGH (ref 70–99)
Glucose-Capillary: 173 mg/dL — ABNORMAL HIGH (ref 70–99)

## 2014-02-20 LAB — GLUTAMIC ACID DECARBOXYLASE AUTO ABS

## 2014-02-20 MED ORDER — LISINOPRIL 10 MG PO TABS
10.0000 mg | ORAL_TABLET | Freq: Every day | ORAL | Status: DC
  Administered 2014-02-20 – 2014-02-23 (×4): 10 mg via ORAL
  Filled 2014-02-20 (×4): qty 1

## 2014-02-20 MED ORDER — APIXABAN 5 MG PO TABS
5.0000 mg | ORAL_TABLET | Freq: Two times a day (BID) | ORAL | Status: DC
  Administered 2014-02-20 – 2014-02-24 (×8): 5 mg via ORAL
  Filled 2014-02-20 (×9): qty 1

## 2014-02-20 MED ORDER — OXYCODONE-ACETAMINOPHEN 5-325 MG PO TABS
1.0000 | ORAL_TABLET | ORAL | Status: DC | PRN
  Administered 2014-02-20 – 2014-02-21 (×5): 1 via ORAL
  Filled 2014-02-20 (×5): qty 1

## 2014-02-20 MED ORDER — MORPHINE SULFATE 4 MG/ML IJ SOLN
4.0000 mg | INTRAMUSCULAR | Status: DC | PRN
  Administered 2014-02-20 – 2014-02-22 (×4): 4 mg via INTRAVENOUS
  Filled 2014-02-20 (×5): qty 1

## 2014-02-20 MED ORDER — MORPHINE SULFATE 4 MG/ML IJ SOLN: 4.0000 mg | INTRAMUSCULAR | Status: DC | PRN

## 2014-02-20 MED ORDER — MORPHINE SULFATE 2 MG/ML IJ SOLN
2.0000 mg | INTRAMUSCULAR | Status: DC | PRN
  Administered 2014-02-20: 2 mg via INTRAVENOUS
  Filled 2014-02-20: qty 1

## 2014-02-20 NOTE — Progress Notes (Signed)
Subjective: 1 Day Post-Op Procedure(s) (LRB): RIGHT ABOVE KNEE AMPUTATION  (Right) Patient reports pain as moderate.  Pt c/o nausea this morning.  This has been an issue in the past and attributed to diabetic gastroparesis.  Objective: Vital signs in last 24 hours: Temp:  [97.9 F (36.6 C)-98.6 F (37 C)] 98.2 F (36.8 C) (02/12 0615) Pulse Rate:  [95-107] 107 (02/12 0615) Resp:  [8-20] 18 (02/12 0615) BP: (143-162)/(67-89) 156/73 mmHg (02/12 0615) SpO2:  [94 %-100 %] 96 % (02/12 0615)  Intake/Output from previous day: 02/11 0701 - 02/12 0700 In: 760 [P.O.:480; I.V.:280] Out: -  Intake/Output this shift:     Recent Labs  02/18/14 0411 02/19/14 0049 02/20/14 0557  HGB 9.1* 9.3* 8.7*    Recent Labs  02/19/14 0049 02/20/14 0557  WBC 13.0* 17.1*  RBC 3.44* 3.16*  HCT 28.8* 27.2*  PLT 505* 485*    Recent Labs  02/18/14 0411 02/19/14 0049  NA 139 138  K 3.6 3.8  CL 107 105  CO2 23 27  BUN 26* 20  CREATININE 1.31 1.15  GLUCOSE 137* 130*  CALCIUM 8.6 8.6   No results for input(s): LABPT, INR in the last 72 hours.  PE:  R LE stump dressed and dry.  Assessment/Plan: 1 Day Post-Op Procedure(s) (LRB): RIGHT ABOVE KNEE AMPUTATION  (Right)  Pt may restart anticoagulation this afternoon with Eliquus.  I've ordered stump shrinker socks for him and will plan a dressing change Sunday or Monday.  He can be OOB as tolerated and should restart PT.  [name] 02/20/2014, 7:44 AM

## 2014-02-20 NOTE — Progress Notes (Signed)
Orthopedic Tech Progress Note Patient Details:  [name] 1975/06/08 [id number]  Patient ID: [name], male   DOB: Nov 05, 1975, 39 y.o.   MRN: [id number] Called in bio-tech brace order; spoke with Raul Del, Garyn Waguespack 02/20/2014, 9:20 AM

## 2014-02-20 NOTE — Progress Notes (Signed)
Subjective: Patient reports nausea this morning, but denies vomiting. He states he is having a lot of pain.   Objective: Vital signs in last 24 hours: Filed Vitals:   02/20/14 0615 02/20/14 0818 02/20/14 1208 02/20/14 1243  BP: 156/73 169/75 154/69 182/76  Pulse: 107 105  101  Temp: 98.2 F (36.8 C)   98.5 F (36.9 C)  TempSrc: Oral   Oral  Resp: 18   18  Height:      Weight:      SpO2: 96%   92%   Weight change:   Intake/Output Summary (Last 24 hours) at 02/20/14 1506 Last data filed at 02/20/14 1458  Gross per 24 hour  Intake   1000 ml  Output      0 ml  Net   1000 ml   Physical Exam General: sitting up in bed holding emesis bin, appears uncomfortable, depressed mood  HEENT: [place]/AT, EOMI, mucus membranes moist CV: RRR, no m/g/r Pulm: CTA bilaterally, breaths non-labored Abd: BS+, soft, obese, non-tender Ext: R BKA stump covered with ace bandage/dressing. Left leg has 2+ pitting edema.  Neuro: alert and oriented x 3, no focal deficits  Lab Results: Basic Metabolic Panel:  Recent Labs Lab 02/18/14 0411 02/19/14 0049  NA 139 138  K 3.6 3.8  CL 107 105  CO2 23 27  GLUCOSE 137* 130*  BUN 26* 20  CREATININE 1.31 1.15  CALCIUM 8.6 8.6   Liver Function Tests:  Recent Labs Lab 02/18/14 0411  AST 14  ALT 12  ALKPHOS 165*  BILITOT 0.8  PROT 6.3  ALBUMIN 1.4*   CBC:  Recent Labs Lab 02/19/14 0049 02/20/14 0557  WBC 13.0* 17.1*  HGB 9.3* 8.7*  HCT 28.8* 27.2*  MCV 83.7 86.1  PLT 505* 485*   CBG:  Recent Labs Lab 02/19/14 2024 02/19/14 2130 02/20/14 0053 02/20/14 0403 02/20/14 0907 02/20/14 1240  GLUCAP 182* 186* 165* 148* 173* 168*   Fasting Lipid Panel:  Recent Labs Lab 02/19/14 0050  CHOL 94  HDL 23*  LDLCALC 53  TRIG 91  CHOLHDL 4.1   Medications: I have reviewed the patient's current medications. Scheduled Meds: . amLODipine  10 mg Oral Daily  . apixaban  5 mg Oral BID  . calcium carbonate  1 tablet Oral TID WC  .  carvedilol  25 mg Oral BID WC  . escitalopram  20 mg Oral QHS  . furosemide  40 mg Oral Daily  . gabapentin  300 mg Oral TID  . insulin aspart  0-9 Units Subcutaneous 6 times per day  . insulin glargine  5 Units Subcutaneous QHS  . lisinopril  10 mg Oral Daily  . multivitamin with minerals  1 tablet Oral Daily  . nortriptyline  10 mg Oral QHS  . pantoprazole  40 mg Oral BID  . pravastatin  20 mg Oral q1800  . senna-docusate  2 tablet Oral BID  . tamsulosin  0.4 mg Oral QPC supper   Continuous Infusions: . sodium chloride 75 mL/hr at 02/20/14 1208   PRN Meds:.acetaminophen, ALPRAZolam, metoCLOPramide **OR** metoCLOPramide (REGLAN) injection, morphine injection, ondansetron **OR** ondansetron (ZOFRAN) IV, sodium chloride Assessment/Plan:  S/p R Leg AKA with Wound Dehiscence: Ortho performed right AKA yesterday (2/11), post-op day 1. Patient reporting moderate pain today, which is significant as he is not a complainer. Also with some nausea this AM as well. Will switch pain medication to morphine as he states this works best for him. Surprising that Dilaudid was  not working for him. He states oxycodone does not control his pain.   - Ortho following, appreciate recommendations  - Dressing changes per ortho, likely to do Sunday or Monday - Morphine 4 mg Q4H PRN - Can add Percocet or Tramadol if pain still not controlled with morphine   Type 1 Diabetes Mellitus: Last HbA1c 6.8 on 01/30/14. Blood sugars controlled in 140s-170s.  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 Held 2/11 due to surgery. Will resume tonight.  - Resume Eliquis tonight   Anemia of Chronic Disease: Hbg stable at 8.7 this AM. Baseline is 8-9.  - CBC intermittently   CKD Stage 3: Cr 1.31 on admission, 1.15 yesterday. Baseline difficult to assess, ~2.1-2.3 in the past. Will continue to monitor. - bmet in AM - Continue Lasix  Neuropathic Pain:   - Continue Gabapentin 300 mg TID - Continue Nortriptyline 10 mg daily   HTN: BP stable in [id number] systolic. Patient takes Amloidpine, Coreg, Lasix, and Lisinopril at home. - Continue Amlodipine 10 mg daily - Continue Coreg 25 mg BID - Continue Lasix 40 mg daily - Resume home Lisinopril today  HLD: Lipid panel on 02/19/13 shows Chol 258, Trigly 329, HDL 45, and LDL 147. Patient takes Pravastatin at home. - Continue Pravastatin 20 mg daily   GERD: - Continue Protonix 40 mg BID  Constipation:  - Continue Sennokot  Diet: Carb modified  VTE PPx: Resume Eliquis tonight  Dispo: Disposition is deferred at this time, awaiting improvement of current medical problems.  Anticipated discharge in approximately 2-3 day(s).   The patient does have a current PCP Lucious Groves, DO) and does need an [facility] hospital follow-up appointment after discharge.  The patient does not have transportation limitations that hinder transportation to clinic appointments.  .Services Needed at time of discharge: Y = Yes, Blank = No PT:   OT:   RN:   Equipment:   Other:     LOS: 2 days   Albin Felling, MD 02/20/2014, 3:06 PM

## 2014-02-20 NOTE — Progress Notes (Signed)
Pt up to side of bed. Paged provider on call for more pain meds. Per provider it's okay to give pt 0.5mg  of dilaudid.

## 2014-02-20 NOTE — Evaluation (Signed)
Physical Therapy Evaluation Patient Details Name: [name] MRN: [id number] DOB: 1975/03/01 Today's Date: 02/20/2014   History of Present Illness  Patient is a 39 year old man with history of type 1 diabetes mellitus, recurrent venous thromboembolism (DVT/PE), hypertension, hyperlipidemia, chronic kidney disease, status post right BKA by Dr. Doran Durand on 12/22/2013 due to right foot abscesses and wet gangrene, and other problems as outlined in the medical history, admitted with complaint of nausea/vomiting, intermittent abdominal pain, and fall at home with trauma to his amputation stump. He denies bright red blood per rectum; he reports one episode of dark stool recently.  S/P revision of BKA to AKA due to gangrene  Clinical Impression  Pt admitted with/for stump dehiscence, s/p revision of BKA to AKA.  Pt currently limited functionally due to the problems listed. ( See problems list.)   Pt will benefit from PT to maximize function and safety in order to get ready for next venue listed below.     Follow Up Recommendations CIR    Equipment Recommendations  None recommended by PT    Recommendations for Other Services Rehab consult     Precautions / Restrictions Precautions Precautions: Fall Precaution Comments: New Rt AKA, with protector      Mobility  Bed Mobility Overal bed mobility: Needs Assistance Bed Mobility: Supine to Sit;Sit to Supine     Supine to sit: Mod assist Sit to supine: Mod assist   General bed mobility comments: Assistance with legs due to pain R and stiffness L LE  Transfers                 General transfer comment: not tested today  Ambulation/Gait                Stairs            Wheelchair Mobility    Modified Rankin (Stroke Patients Only)       Balance Overall balance assessment: Needs assistance Sitting-balance support: No upper extremity supported Sitting balance-[name]: Fair Sitting balance - Comments: got  fatigued sitting EOB without UE's quickly                                     Pertinent Vitals/Pain Pain Assessment: 0-10 Pain Score: 10-Worst pain ever Pain Location: stump Pain Descriptors / Indicators: Constant;Aching Pain Intervention(s): Premedicated before session;Monitored during session    [place] expects to be discharged to:: Private residence Living Arrangements: Spouse/significant other Available Help at Discharge: Friend(s);Available PRN/intermittently Type of Home: Apartment Home Access: Stairs to enter Entrance Stairs-Rails: None Entrance Stairs-Number of Steps: 2 + 1 to enter mom's house;  6 steps to enter girlfriend's house Home Layout: One level;Two level Home Equipment: Wheelchair - [employer] - 2 wheels;Bedside commode;Shower seat Additional Comments: Has been staying with his girlfriend    Prior Function Level of Independence: Needs assistance   Gait / Transfers Assistance Needed: Ambulating short distances with RW. Using w/c primarily.Marland Kitchen  Has been through CIR and SNF rehab.     Comments: Independent prior to admission on 12/09/13.     Hand Dominance   Dominant Hand: Right    Extremity/Trunk Assessment   Upper Extremity Assessment: Overall WFL for tasks assessed           Lower Extremity Assessment: Generalized weakness         Communication   Communication: No difficulties  Cognition Arousal/Alertness: Awake/alert Behavior During Therapy: Flat  affect Overall Cognitive Status: Within Functional Limits for tasks assessed                      General Comments      Exercises        Assessment/Plan    PT Assessment Patient needs continued PT services  PT Diagnosis Generalized weakness;Acute pain   PT Problem List Decreased strength;Decreased range of motion;Decreased activity tolerance;Decreased balance;Decreased mobility;Decreased knowledge of use of DME;Pain  PT Treatment Interventions  DME instruction;Gait training;Functional mobility training;Therapeutic activities;Therapeutic exercise;Balance training;Patient/family education   PT Goals (Current goals can be found in the Care Plan section) Acute Rehab PT Goals Patient Stated Goal: Go back to rehab PT Goal Formulation: With patient Time For Goal Achievement: 02/14/14 Potential to Achieve Goals: Good    Frequency Min 3X/week   Barriers to discharge Inaccessible home environment;Decreased caregiver support      Co-evaluation               End of Session   Activity Tolerance: Patient limited by pain Patient left: in bed;with call bell/phone within reach;with family/visitor present Nurse Communication: Mobility status         Time: 1710-1740 PT Time Calculation (min) (ACUTE ONLY): 30 min   Charges:   PT Evaluation $Initial PT Evaluation Tier I: 1 Procedure PT Treatments $Therapeutic Activity: 8-22 mins   PT G Codes:        Ger Nicks, Tessie Fass 02/20/2014, 6:00 PM 02/20/2014  Donnella Sham, PT [phone]  (pager)

## 2014-02-20 NOTE — Op Note (Signed)
[name], [name] NO.:  [id number]  MEDICAL RECORD NO.:  [id number]  LOCATION:  5W11C                        FACILITY:  [facility]  PHYSICIAN:  [name], MD        DATE OF BIRTH:  Feb 19, 1975  DATE OF PROCEDURE:  02/19/2014 DATE OF DISCHARGE:                              OPERATIVE REPORT   PREOPERATIVE DIAGNOSIS:  Right leg below-knee amputation.  Wound dehiscence.  POSTOPERATIVE DIAGNOSIS:  Right leg below-knee amputation.  Wound dehiscence with tibial osteomyelitis and posterior flap gangrene.  PROCEDURE:  Right above-the-knee amputation.  SURGEON:  [name], MD.  ANESTHESIA:  General.  ESTIMATED BLOOD LOSS:  Minimal.  TIME OF TOURNIQUET:  59 minutes at 300 mmHg.  COMPLICATIONS:  None apparent.  DISPOSITION:  Extubated awake and stable to recovery.  INDICATIONS FOR PROCEDURE:  The patient is a 39 year old male with past medical history significant for diabetes.  He had a gangrenous right foot, underwent below-the-knee amputation two months ago.  He has recently suffered dehiscence of the right leg wound and presents now for above-knee amputation.  He has exposed tibia and gangrenous posterior flap.  He understands the risks and benefits, the alternative treatment options, and elects for surgical treatment.  He specifically understands risks of bleeding, infection, nerve damage, blood clots, need for additional surgery, continued pain, revision amputation, and death.  PROCEDURE IN DETAIL:  After preoperative consent was obtained, the correct operative site was identified.  The patient was brought to the operating room and placed supine on the operating table.  General anesthesia was induced.  Preoperative antibiotics were administered. Surgical time-out was taken.  Right lower extremity was prepped and draped in standard sterile fashion.  A sterile tourniquet was then applied around the proximal thigh.  The extremity was exsanguinated. The  tourniquet was inflated to 350 mmHg.  A fishmouth incision was marked on the skin.  Sharp dissection was carried down through the incision and down through the anterior compartment musculature.  The periosteum was elevated at the femur.  The reciprocating saw was used to cut through the femur.  The arteries and veins were clamped and the posterior compartment musculature was cut.  The distal portion of the extremity was passed off the field as specimen to Pathology.  The cut surface of the femur was smoothed with a rasp.  The named vessels were all double suture ligated with 2-0 silk ties.  Sciatic nerve was tied with a 2-0 Vicryl and then sharply divided on gentle tension and allowed to retract in the proximal musculature.  The wound was then irrigated copiously.  Four drill holes were made in the distal end of the femur. The adductor tendon was repaired to the distal femur laterally and the hamstrings tendons were attached to the distal femur anterolaterally through the drill holes with #1 PDS suture.  After myodesis was complete, the anterior fascia was repaired and the posterior fascia with simple sutures of #1 Vicryl.  Subcutaneous tissues were approximated with inverted simple sutures of 3-0 Monocryl.  The skin was closed with 2-0 nylon horizontal mattress sutures.  Sterile dressings were applied and followed by compression wrap.  The tourniquet was released at  59 minutes after application of the dressings.  The patient was awakened from anesthesia and transported to the recovery room in stable condition.  FOLLOWUP PLAN:  The patient will be nonweightbearing on the right lower extremity.  He will be managed by the Internal Medicine Teaching Service with respect to his multiple medical conditions.  He will restart anticoagulation tomorrow.     [name], MD     JH/MEDQ  D:  02/19/2014  T:  02/20/2014  Job:  [id number]

## 2014-02-20 NOTE — Progress Notes (Signed)
Notified MD oncall that pt requesting pain medication between the morphine. MD stated he would put in order for Perocet. Will continue to monitor pt. Hassan Buckler

## 2014-02-21 LAB — CBC
HEMATOCRIT: 29.1 % — AB (ref 39.0–52.0)
HEMOGLOBIN: 9.3 g/dL — AB (ref 13.0–17.0)
MCH: 27.1 pg (ref 26.0–34.0)
MCHC: 32 g/dL (ref 30.0–36.0)
MCV: 84.8 fL (ref 78.0–100.0)
Platelets: 532 10*3/uL — ABNORMAL HIGH (ref 150–400)
RBC: 3.43 MIL/uL — AB (ref 4.22–5.81)
RDW: 14.7 % (ref 11.5–15.5)
WBC: 16 10*3/uL — AB (ref 4.0–10.5)

## 2014-02-21 LAB — BASIC METABOLIC PANEL
ANION GAP: 12 (ref 5–15)
BUN: 13 mg/dL (ref 6–23)
CALCIUM: 7.8 mg/dL — AB (ref 8.4–10.5)
CHLORIDE: 104 mmol/L (ref 96–112)
CO2: 20 mmol/L (ref 19–32)
Creatinine, Ser: 1.15 mg/dL (ref 0.50–1.35)
GFR calc Af Amer: >90 mL/min (ref >90)
GFR calc non Af Amer: 80 mL/min — ABNORMAL LOW (ref >90)
Glucose, Bld: 144 mg/dL — ABNORMAL HIGH (ref 70–99)
POTASSIUM: 3.7 mmol/L (ref 3.5–5.1)
SODIUM: 136 mmol/L (ref 135–145)

## 2014-02-21 LAB — GLUCOSE, CAPILLARY
GLUCOSE-CAPILLARY: 147 mg/dL — AB (ref 70–99)
GLUCOSE-CAPILLARY: 149 mg/dL — AB (ref 70–99)
Glucose-Capillary: 129 mg/dL — ABNORMAL HIGH (ref 70–99)
Glucose-Capillary: 139 mg/dL — ABNORMAL HIGH (ref 70–99)

## 2014-02-21 MED ORDER — INSULIN ASPART 100 UNIT/ML ~~LOC~~ SOLN
0.0000 [IU] | Freq: Three times a day (TID) | SUBCUTANEOUS | Status: DC
  Administered 2014-02-21: 1 [IU] via SUBCUTANEOUS
  Administered 2014-02-22: 2 [IU] via SUBCUTANEOUS
  Administered 2014-02-22 – 2014-02-23 (×4): 1 [IU] via SUBCUTANEOUS
  Administered 2014-02-23 – 2014-02-24 (×3): 2 [IU] via SUBCUTANEOUS

## 2014-02-21 NOTE — Progress Notes (Signed)
Subjective: 2 Days Post-Op Procedure(s) (LRB): RIGHT ABOVE KNEE AMPUTATION  (Right) Patient reports pain as moderate.  Nausea has improved.  Off of vanc now.  Objective: Vital signs in last 24 hours: Temp:  [98.3 F (36.8 C)-98.9 F (37.2 C)] 98.8 F (37.1 C) (02/13 0524) Pulse Rate:  [101-103] 103 (02/13 0524) Resp:  [18-20] 18 (02/13 0524) BP: (153-182)/(74-84) 161/84 mmHg (02/13 0939) SpO2:  [84 %-93 %] 84 % (02/13 0524)  Intake/Output from previous day: 02/12 0701 - 02/13 0700 In: 2347.5 [P.O.:720; I.V.:1627.5] Out: -  Intake/Output this shift:     Recent Labs  02/19/14 0049 02/20/14 0557 02/21/14 0448  HGB 9.3* 8.7* 9.3*    Recent Labs  02/20/14 0557 02/21/14 0448  WBC 17.1* 16.0*  RBC 3.16* 3.43*  HCT 27.2* 29.1*  PLT 485* 532*    Recent Labs  02/19/14 0049 02/21/14 0448  NA 138 136  K 3.8 3.7  CL 105 104  CO2 27 20  BUN 20 13  CREATININE 1.15 1.15  GLUCOSE 130* 144*  CALCIUM 8.6 7.8*   No results for input(s): LABPT, INR in the last 72 hours.  PE:  obese male in nad.  R LE dressed and dry.  Assessment/Plan: 2 Days Post-Op Procedure(s) (LRB): RIGHT ABOVE KNEE AMPUTATION  (Right) Up with therapy  NWB on the right LE.  D/c planning per IM teaching service.  [name] 02/21/2014, 12:33 PM

## 2014-02-21 NOTE — Progress Notes (Signed)
Subjective:  Pt seen and examined in AM. Overnight he was given percocet for breakthrough pain which helped somewhat. He denies fever, chills, abdominal pain, nausea, vomiting, or change in BM/urination. He worked with PT yesterday and sat on the side of the bed. He is agreeable to inpatient rehab.   Objective: Vital signs in last 24 hours: Filed Vitals:   02/20/14 2101 02/21/14 0444 02/21/14 0524 02/21/14 0939  BP: 153/74 159/76 165/81 161/84  Pulse:   103   Temp: 98.3 F (36.8 C) 98.9 F (37.2 C) 98.8 F (37.1 C)   TempSrc: Oral Oral Oral   Resp: 20 20 18    Height:      Weight:      SpO2: 93% 90% 84%    Weight change:   Intake/Output Summary (Last 24 hours) at 02/21/14 1201 Last data filed at 02/21/14 0902  Gross per 24 hour  Intake 2347.5 ml  Output      0 ml  Net 2347.5 ml   Physical Exam General: Sitting comfortably in bed. Depressed mood  HEENT: [place]/AT, EOMI, mucus membranes moist CV: RRR, no m/g/r Pulm: CTA bilaterally, breaths non-labored Abd: BS+, soft, obese, non-tender Ext: R BKA stump covered with ace bandage/dressing. Left leg has 2+ pitting edema.  Neuro: Alert and oriented x 3, no focal deficits  Lab Results: Basic Metabolic Panel:  Recent Labs Lab 02/19/14 0049 02/21/14 0448  NA 138 136  K 3.8 3.7  CL 105 104  CO2 27 20  GLUCOSE 130* 144*  BUN 20 13  CREATININE 1.15 1.15  CALCIUM 8.6 7.8*   Liver Function Tests:  Recent Labs Lab 02/18/14 0411  AST 14  ALT 12  ALKPHOS 165*  BILITOT 0.8  PROT 6.3  ALBUMIN 1.4*   CBC:  Recent Labs Lab 02/20/14 0557 02/21/14 0448  WBC 17.1* 16.0*  HGB 8.7* 9.3*  HCT 27.2* 29.1*  MCV 86.1 84.8  PLT 485* 532*   CBG:  Recent Labs Lab 02/20/14 0403 02/20/14 0907 02/20/14 1240 02/20/14 1650 02/20/14 2013 02/21/14 0017  GLUCAP 148* 173* 168* 160* 161* 149*   Fasting Lipid Panel:  Recent Labs Lab 02/19/14 0050  CHOL 94  HDL 23*  LDLCALC 53  TRIG 91  CHOLHDL 4.1    Medications: I have reviewed the patient's current medications. Scheduled Meds: . amLODipine  10 mg Oral Daily  . apixaban  5 mg Oral BID  . calcium carbonate  1 tablet Oral TID WC  . carvedilol  25 mg Oral BID WC  . escitalopram  20 mg Oral QHS  . furosemide  40 mg Oral Daily  . gabapentin  300 mg Oral TID  . insulin aspart  0-9 Units Subcutaneous 6 times per day  . insulin glargine  5 Units Subcutaneous QHS  . lisinopril  10 mg Oral Daily  . multivitamin with minerals  1 tablet Oral Daily  . nortriptyline  10 mg Oral QHS  . pantoprazole  40 mg Oral BID  . pravastatin  20 mg Oral q1800  . senna-docusate  2 tablet Oral BID  . tamsulosin  0.4 mg Oral QPC supper   Continuous Infusions: . sodium chloride 75 mL/hr at 02/21/14 0200   PRN Meds:.ALPRAZolam, metoCLOPramide **OR** metoCLOPramide (REGLAN) injection, morphine injection, ondansetron **OR** ondansetron (ZOFRAN) IV, oxyCODONE-acetaminophen, sodium chloride Assessment/Plan:  S/p R Leg AKA with Wound Dehiscence/Tibial Osteomyelitis/Gangrene: Ortho performed right AKA on 2/11, pt is POD # 2. Pt still with moderate pain.    -Ortho following, appreciate  recommendations  -Dressing changes per ortho, likely tomm or Monday -Morphine 4 mg Q4H PRN and percocet/roxicet 5-325 mg Q 4 hr PRN pain   -Continue PT, non-weight bearing on the right LE, will consult CIR for inpatient rehab admission  Type 1? Diabetes Mellitus: Last HbA1c 6.8 on 01/30/14. Last CBG 149. Questionable Type 1, anti-GAD negative.  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  No active bleeding and Hg stable.  - Continue Eliquis 5 mg BID   Anemia of Chronic Disease: Hg stable at 9.3 this AM. Baseline is 8-9.  - CBC intermittently   CKD Stage 3: Cr 1.31 on admission, 1.15 today. Baseline difficult to assess, ~2.1-2.3 in the past. Will  continue to monitor. -Monitor BMP  -Avoid nephrotoxins -Monitor daily weights and strict I & O's  Neuropathic Pain:  - Continue Gabapentin 300 mg TID - Continue Nortriptyline 10 mg daily   HTN: BP elevated in 99991111 systolic. Patient takes Amloidpine, Coreg, Lasix, and Lisinopril at home. - Continue Amlodipine 10 mg daily - Continue Coreg 25 mg BID - Continue Lasix 40 mg daily - Consider increasing Lisinopril from 10 to 20 mg daily if continues to be uncontrolled  HLD: Lipid panel on 02/19/13 shows Chol 258, Trigly 329, HDL 45, and LDL 147. Patient takes Pravastatin at home. - Continue Pravastatin 20 mg daily   GERD: - Continue Protonix 40 mg BID  Constipation:  - Continue Sennokot-S BID   Diet: Carb modified  VTE PPx: Eliquis, SCD Dispo: Disposition is deferred at this time, awaiting improvement of current medical problems.  Anticipated discharge in approximately 2-3 day(s).   The patient does have a current PCP Lucious Groves, DO) and does need an [facility] hospital follow-up appointment after discharge.  The patient does not have transportation limitations that hinder transportation to clinic appointments.  .Services Needed at time of discharge: Y = Yes, Blank = No PT:   OT:   RN:   Equipment:   Other:     LOS: 3 days   Juluis Mire, MD 02/21/2014, 12:01 PM

## 2014-02-22 LAB — GLUCOSE, CAPILLARY
GLUCOSE-CAPILLARY: 129 mg/dL — AB (ref 70–99)
GLUCOSE-CAPILLARY: 141 mg/dL — AB (ref 70–99)
Glucose-Capillary: 176 mg/dL — ABNORMAL HIGH (ref 70–99)

## 2014-02-22 MED ORDER — OXYCODONE-ACETAMINOPHEN 5-325 MG PO TABS
1.0000 | ORAL_TABLET | ORAL | Status: DC | PRN
  Administered 2014-02-22 – 2014-02-23 (×6): 2 via ORAL
  Filled 2014-02-22 (×7): qty 2

## 2014-02-22 MED ORDER — HYDROMORPHONE HCL 1 MG/ML IJ SOLN
0.5000 mg | Freq: Two times a day (BID) | INTRAMUSCULAR | Status: DC | PRN
  Administered 2014-02-22 – 2014-02-24 (×4): 1 mg via INTRAVENOUS
  Filled 2014-02-22 (×7): qty 1

## 2014-02-22 MED ORDER — HYDROMORPHONE HCL 1 MG/ML IJ SOLN: 0.5000 mg | INTRAMUSCULAR | Status: DC | PRN

## 2014-02-22 NOTE — Progress Notes (Signed)
Pt O2 sat 89-91% on RA. Patient placed on 1L O2 [place] sat unchanged. Increased O2 to 2L, O2 sat 93%. Explained to patient that he would need to wear [place] while sleeping and if saturation improved in the morning oxygen may be able to be discontinued. Patient agreeable to wear  for now. Will continue to monitor.

## 2014-02-22 NOTE — Progress Notes (Signed)
Subjective: Patient reports moderate pain this morning. Will keep Percocet as seems to help pain and switch to Dilaudid from morphine. He reports some nausea this morning and has been able to eat a little bit.   Objective: Vital signs in last 24 hours: Filed Vitals:   02/21/14 2157 02/21/14 2240 02/22/14 0508 02/22/14 1033  BP: 138/63  148/80 145/77  Pulse: 93  96   Temp: 98.5 F (36.9 C)  98.6 F (37 C)   TempSrc: Oral  Oral   Resp: 18  22   Height:      Weight:      SpO2: 91% 93% 94%    Weight change:   Intake/Output Summary (Last 24 hours) at 02/22/14 1050 Last data filed at 02/22/14 0600  Gross per 24 hour  Intake 1771.25 ml  Output    725 ml  Net 1046.25 ml   Physical Exam General: alert, sitting up in bed, depressed mood HEENT: [place]/AT, EOMI, mucus membranes moist CV: RRR, no m/g/r Pulm: CTA bilaterally, breaths non-labored Abd: BS+, soft, obese, non-tender Ext: R AKA stump covered with ace bandage/dressing. Left leg has 2+ pitting edema. Neuro:  Alert and oriented x 3, no focal deficits   Lab Results: Basic Metabolic Panel:  Recent Labs Lab 02/19/14 0049 02/21/14 0448  NA 138 136  K 3.8 3.7  CL 105 104  CO2 27 20  GLUCOSE 130* 144*  BUN 20 13  CREATININE 1.15 1.15  CALCIUM 8.6 7.8*   CBC:  Recent Labs Lab 02/20/14 0557 02/21/14 0448  WBC 17.1* 16.0*  HGB 8.7* 9.3*  HCT 27.2* 29.1*  MCV 86.1 84.8  PLT 485* 532*   CBG:  Recent Labs Lab 02/20/14 1650 02/20/14 2013 02/21/14 0017 02/21/14 1321 02/21/14 1754 02/21/14 2154  GLUCAP 160* 161* 149* 147* 129* 139*   Fasting Lipid Panel:  Recent Labs Lab 02/19/14 0050  CHOL 94  HDL 23*  LDLCALC 53  TRIG 91  CHOLHDL 4.1   Medications: I have reviewed the patient's current medications. Scheduled Meds: . amLODipine  10 mg Oral Daily  . apixaban  5 mg Oral BID  . calcium carbonate  1 tablet Oral TID WC  . carvedilol  25 mg Oral BID WC  . escitalopram  20 mg Oral QHS  . furosemide   40 mg Oral Daily  . gabapentin  300 mg Oral TID  . insulin aspart  0-9 Units Subcutaneous TID WC  . insulin glargine  5 Units Subcutaneous QHS  . lisinopril  10 mg Oral Daily  . multivitamin with minerals  1 tablet Oral Daily  . nortriptyline  10 mg Oral QHS  . pantoprazole  40 mg Oral BID  . pravastatin  20 mg Oral q1800  . senna-docusate  2 tablet Oral BID  . tamsulosin  0.4 mg Oral QPC supper   Continuous Infusions: . sodium chloride 75 mL/hr at 02/22/14 0454   PRN Meds:.ALPRAZolam, HYDROmorphone (DILAUDID) injection, metoCLOPramide **OR** metoCLOPramide (REGLAN) injection, ondansetron **OR** ondansetron (ZOFRAN) IV, oxyCODONE-acetaminophen, sodium chloride Assessment/Plan:  S/p R Leg AKA with Wound Dehiscence/Tibial Osteomyelitis/Gangrene: Ortho performed right AKA on 2/11, pt is POD # 3. Pt still with moderate pain.  - Ortho following, appreciate recommendations  - Dressing changes per ortho - Change Morphine to Dilaudid 0.5-1 mg Q2H PRN and continue percocet/roxicet 5-325 mg Q 4 hr PRN pain  - Continue PT, non-weight bearing on the right LE - CIR consulted for inpatient rehab admission  Type 1? Diabetes Mellitus: Last  HbA1c 6.8 on 01/30/14. Questionable Type 1, anti-GAD negative.  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 No active bleeding and Hg stable.  - Continue Eliquis 5 mg BID   Anemia of Chronic Disease: Hg stable at 9.3. Baseline is 8-9.  - CBC intermittently   CKD Stage 3: Cr 1.31 on admission, 1.15 yesterday. Baseline difficult to assess, ~2.1-2.3 in the past. Will continue to monitor. - Monitor BMP  - Avoid nephrotoxins - Monitor daily weights and strict I & O's  Neuropathic Pain:  - Continue Gabapentin 300 mg TID - Continue Nortriptyline 10 mg daily   HTN: BP in [id number] systolic. Patient takes Amloidpine, Coreg,  Lasix, and Lisinopril at home. - Continue Amlodipine 10 mg daily - Continue Coreg 25 mg BID - Continue Lasix 40 mg daily - Consider increasing Lisinopril from 10 to 20 mg daily if continues to be uncontrolled  HLD: Lipid panel on 02/19/13 shows Chol 258, Trigly 329, HDL 45, and LDL 147. Patient takes Pravastatin at home. - Continue Pravastatin 20 mg daily   GERD: - Continue Protonix 40 mg BID  Constipation:  - Continue Sennokot-S BID   Diet: Carb modified  VTE Ppx: Eliquis  Dispo: Awaiting evaluation by CIR  The patient does have a current PCP Lucious Groves, DO) and does need an [facility] hospital follow-up appointment after discharge.  The patient does not have transportation limitations that hinder transportation to clinic appointments.  .Services Needed at time of discharge: Y = Yes, Blank = No PT:   OT:   RN:   Equipment:   Other:     LOS: 4 days   Albin Felling, MD 02/22/2014, 10:50 AM

## 2014-02-22 NOTE — Progress Notes (Signed)
Subjective: 3 Days Post-Op Procedure(s) (LRB): RIGHT ABOVE KNEE AMPUTATION  (Right) Patient reports pain as mild.    Objective: Vital signs in last 24 hours: Temp:  [98.5 F (36.9 C)-98.6 F (37 C)] 98.6 F (37 C) (02/14 0508) Pulse Rate:  [93-96] 96 (02/14 0508) Resp:  [18-22] 22 (02/14 0508) BP: (138-161)/(63-84) 148/80 mmHg (02/14 0508) SpO2:  [91 %-94 %] 94 % (02/14 0508)  Intake/Output from previous day: 02/13 0701 - 02/14 0700 In: 1771.3 [P.O.:960; I.V.:811.3] Out: 725 [Urine:725] Intake/Output this shift:     Recent Labs  02/20/14 0557 02/21/14 0448  HGB 8.7* 9.3*    Recent Labs  02/20/14 0557 02/21/14 0448  WBC 17.1* 16.0*  RBC 3.16* 3.43*  HCT 27.2* 29.1*  PLT 485* 532*    Recent Labs  02/21/14 0448  NA 136  K 3.7  CL 104  CO2 20  BUN 13  CREATININE 1.15  GLUCOSE 144*  CALCIUM 7.8*   No results for input(s): LABPT, INR in the last 72 hours.  Neurologically intact Incision: dressing C/D/I No cellulitis present  Assessment/Plan: 3 Days Post-Op Procedure(s) (LRB): RIGHT ABOVE KNEE AMPUTATION  (Right) Up with therapy    Draven Laine V 02/22/2014, 9:15 AM

## 2014-02-23 DIAGNOSIS — N179 Acute kidney failure, unspecified: Secondary | ICD-10-CM

## 2014-02-23 DIAGNOSIS — M869 Osteomyelitis, unspecified: Secondary | ICD-10-CM

## 2014-02-23 LAB — GLUCOSE, CAPILLARY
GLUCOSE-CAPILLARY: 135 mg/dL — AB (ref 70–99)
GLUCOSE-CAPILLARY: 136 mg/dL — AB (ref 70–99)
Glucose-Capillary: 137 mg/dL — ABNORMAL HIGH (ref 70–99)
Glucose-Capillary: 133 mg/dL — ABNORMAL HIGH (ref 70–99)
Glucose-Capillary: 165 mg/dL — ABNORMAL HIGH (ref 70–99)
Glucose-Capillary: 150 mg/dL — ABNORMAL HIGH (ref 70–99)
Glucose-Capillary: 135 mg/dL — ABNORMAL HIGH (ref 70–99)

## 2014-02-23 MED ORDER — LISINOPRIL 10 MG PO TABS
10.0000 mg | ORAL_TABLET | Freq: Once | ORAL | Status: AC
  Administered 2014-02-23: 10 mg via ORAL
  Filled 2014-02-23: qty 1

## 2014-02-23 MED ORDER — LISINOPRIL 20 MG PO TABS
20.0000 mg | ORAL_TABLET | Freq: Every day | ORAL | Status: DC
  Administered 2014-02-24: 20 mg via ORAL
  Filled 2014-02-23: qty 1

## 2014-02-23 NOTE — Progress Notes (Signed)
Thank you for consult on [name]. He's well known to CIR from recent stay (12/26/13- 01/20/14)  past R-BKA with multiple medical issues. He had difficulty tolerating intensive therapy but did rally and reached supervision at wheelchair level at discharge. His mother's home is not accessible and he was discharged with girlfriend assisting intermittently.  He was readmitted on 01/22 with trauma to amputation due to fall with subsequent discharge to [facility].   He lacks adequate social supports for safe discharge after short rehab stay and would recommend continued therapy at SNF past discharge. Will defer CIR consult for now.

## 2014-02-23 NOTE — Progress Notes (Signed)
Occupational Therapy Evaluation Patient Details Name: [name] MRN: [id number] DOB: 1975/02/29 Today's Date: 02/23/2014    History of Present Illness 39 year old man with history of type 1 diabetes mellitus, recurrent venous thromboembolism (DVT/PE), hypertension, hyperlipidemia, chronic kidney disease, status post right BKA by Dr. Doran Durand on 12/22/2013 due to right foot abscesses and wet gangrene, and other problems as outlined in the medical history, admitted with complaint of nausea/vomiting, intermittent abdominal pain, and fall at home with trauma to his amputation stump.Revision of BKA to AKA  02/20/14.   Clinical Impression   PTA, pt states he was transferred to [facility] from [facility] center. Pt has apparently participated with CIR before and has also has a SNF admission. Unsure about level of caregiver support and concerns over accessibility of home. At this time, pt will need to D/C to SNF, due to limited caregiver support and non-w/c accessible home. Will follow acutely to maximize functional level of independence with ADL and mobility  And facilitate D/C to SNF.    Follow Up Recommendations  SNF    Equipment Recommendations  None recommended by OT    Recommendations for Other Services Rehab consult     Precautions / Restrictions Precautions Precautions: Fall Precaution Comments: New Rt AKA, with protector Restrictions RLE Weight Bearing: Non weight bearing (R AKA)      Mobility Bed Mobility Overal bed mobility: Needs Assistance Bed Mobility: Supine to Sit;Sit to Supine     Supine to sit: Mod assist Sit to supine: Mod assist   General bed mobility comments: heavy use of bed rails and bed height to increased independence; significant c/o R LE pain during all mobility  Transfers Overall transfer level: Needs assistance   Transfers: Lateral/Scoot Transfers Sit to Stand: Min assist;+2 physical assistance;+2 safety/equipment        Lateral/Scoot  Transfers: Min assist;+2 physical assistance;+2 safety/equipment;From elevated surface General transfer comment: INCREASED time - > 30 min to get from bed to chair    Balance     Sitting balance-[name]: Fair                                      ADL       Grooming: Set up;Sitting   Upper Body Bathing: Set up;Sitting   Lower Body Bathing: Maximal assistance;Bed level       Lower Body Dressing: Maximal assistance;Bed level (Pt able to assist with pulling pants over hips with rolling )       Toileting- Clothing Manipulation and Hygiene: Maximal assistance;Bed level         General ADL Comments: Pt able to participate in LB ADL at bed level with INCREASED time rolling side to side  Long discussion with pt regarding need to find a home which is w/c accessible for realistic goals of mobility at w/c level.  Discussed realistically using a prosthesis with his current weight and deconditioning and need to lose significant weight if that is one of his long term goals. Pt verbalized understanding.                     Pertinent Vitals/Pain Pain Assessment: 0-10 Pain Score: 10-Worst pain ever Pain Location: R LE Pain Descriptors / Indicators: Aching;Burning;Constant Pain Intervention(s): Premedicated before session;Monitored during session;Limited activity within patient's tolerance;RN gave pain meds during session     Hand Dominance Right   Extremity/Trunk Assessment Upper Extremity Assessment Upper Extremity  Assessment: Generalized weakness   Lower Extremity Assessment Lower Extremity Assessment: Generalized weakness;LLE deficits/detail LLE:  (unable to lift LLE off bed in supine due to weakness)   Cervical / Trunk Assessment Cervical / Trunk Assessment: Normal   Communication Communication Communication: No difficulties   Cognition Arousal/Alertness: Awake/alert Behavior During Therapy: Flat affect Overall Cognitive Status: Within  Functional Limits for tasks assessed                     General Comments   limited by pain    Exercises Exercises: Other exercises Other Exercises Other Exercises: encouraged  chair push ups - pt familiar - to work on BUE strengthening and relieve pressure from bottom   Shoulder Instructions      Home Living Family/patient expects to be discharged to:: Private residence Living Arrangements: Spouse/significant other Available Help at Discharge: Friend(s);Available PRN/intermittently Type of Home: House Home Access: Ramped entrance (at girlfriend's house)   Entrance Stairs-Rails: None Home Layout: One level;Two level     Bathroom Shower/Tub: Teacher, early years/pre: Standard Bathroom Accessibility: No   Home Equipment: Wheelchair - [employer] - 2 wheels;Bedside commode;Shower seat   Additional Comments: Has been staying with his girlfriend after D/C from SNF in [place]; girlfriend's house bedroom/bathroom is not w/c accessible.      Prior Functioning/Environment Level of Independence: Needs assistance  Gait / Transfers Assistance Needed: Ambulating short distances with RW. Using w/c primarily.Marland Kitchen  Has been through CIR and SNF rehab.     Comments: Independent prior to admission on 12/09/13.    OT Diagnosis: Generalized weakness;Acute pain   OT Problem List: Decreased strength;Decreased range of motion;Decreased activity tolerance;Impaired balance (sitting and/or standing);Decreased safety awareness;Decreased knowledge of use of DME or AE;Obesity;Pain   OT Treatment/Interventions: Self-care/ADL training;Therapeutic exercise;DME and/or AE instruction;Therapeutic activities;Patient/family education;Balance training    OT Goals(Current goals can be found in the care plan section) Acute Rehab OT Goals Patient Stated Goal: Go back to rehab OT Goal Formulation: With patient Time For Goal Achievement: 03/09/14 Potential to Achieve Goals: Good  OT Frequency:  Min 2X/week   Barriers to D/C:    Unsure of D/C setting accessibility       Co-evaluation              End of Session Nurse Communication: Mobility status;Precautions;Other (comment) (set up for transferes. need for +2 for safety)  Activity Tolerance: Patient limited by pain Patient left: in chair;with call bell/phone within reach   Time: 0934-1020 OT Time Calculation (min): 46 min Charges:  OT General Charges $OT Visit: 1 Procedure OT Evaluation $Initial OT Evaluation Tier I: 1 Procedure OT Treatments $Self Care/Home Management : 23-37 mins G-Codes:    Lovie Zarling,HILLARY 12-Mar-2014, 10:51 AM   Maurie Boettcher, OTR/L  [phone] 2014-03-12

## 2014-02-23 NOTE — Progress Notes (Signed)
Subjective: 4 Days Post-Op Procedure(s) (LRB): RIGHT ABOVE KNEE AMPUTATION  (Right) Patient reports pain as moderate.  Pt c/o soreness in the thigh.  No particularly painful at the wound.  N/V seem to have resolved.  Objective: Vital signs in last 24 hours: Temp:  [98.6 F (37 C)-99.1 F (37.3 C)] 99.1 F (37.3 C) (02/14 2126) Pulse Rate:  [96-98] 96 (02/14 2126) Resp:  [20] 20 (02/14 2126) BP: (145-159)/(77-86) 155/86 mmHg (02/15 0916) SpO2:  [92 %-94 %] 94 % (02/14 2126)  Intake/Output from previous day: 02/14 0701 - 02/15 0700 In: 1197 [P.O.:222; I.V.:975] Out: 1700 [Urine:1700] Intake/Output this shift:     Recent Labs  02/21/14 0448  HGB 9.3*    Recent Labs  02/21/14 0448  WBC 16.0*  RBC 3.43*  HCT 29.1*  PLT 532*    Recent Labs  02/21/14 0448  NA 136  K 3.7  CL 104  CO2 20  BUN 13  CREATININE 1.15  GLUCOSE 144*  CALCIUM 7.8*   No results for input(s): LABPT, INR in the last 72 hours.  PE:  obese male in nad.  More interactive today.   R LE wound healing appropriately.  No cellulitis.  One small area of scant bleeding medially.  No purulence.  Assessment/Plan: 4 Days Post-Op Procedure(s) (LRB): RIGHT ABOVE KNEE AMPUTATION  (Right) Up with therapy  NWB on R LE.  Dressing changed today.  Stump shrinker to support soft tissue.  [name] 02/23/2014, 9:31 AM

## 2014-02-23 NOTE — Progress Notes (Signed)
Physical Therapy Treatment Patient Details Name: [name] MRN: [id number] DOB: November 24, 1975 Today's Date: 02/23/2014    History of Present Illness 39 year old man with history of type 1 diabetes mellitus, recurrent venous thromboembolism (DVT/PE), hypertension, hyperlipidemia, chronic kidney disease, status post right BKA by Dr. Doran Durand on 12/22/2013 due to right foot abscesses and wet gangrene, and other problems as outlined in the medical history, admitted with complaint of nausea/vomiting, intermittent abdominal pain, and fall at home with trauma to his amputation stump.Revision of BKA to AKA due to gangrene 02/20/14.    PT Comments    Progressing slowly, limited by pain.  Emphasis on transfers and sit to stand, but much time was needed to rewrap his stump.  Follow Up Recommendations  CIR     Equipment Recommendations  None recommended by PT    Recommendations for Other Services Rehab consult     Precautions / Restrictions Precautions Precautions: Fall Precaution Comments: New Rt AKA, with protector Restrictions RLE Weight Bearing: Non weight bearing    Mobility  Bed Mobility Overal bed mobility: Needs Assistance Bed Mobility: Sit to Supine       Sit to supine: Max assist;+2 for physical assistance   General bed mobility comments: going back to bed, pt collapsed onto be too close to the edge needing 2 person maximal effort to get him turned around in bed and positioned correctly in the bed.  Transfers Overall transfer level: Needs assistance Equipment used:  Clarise Cruz plus) Transfers: Sit to/from Stand Sit to Stand: [employer] safety/equipment (with use of the [employer])         General transfer comment: pt able to stabilize himself with UE's in the sara plus and stood upright, but gave out and dropped down prior to getting him safely back on the bed.  Ambulation/Gait                 Stairs            Wheelchair Mobility    Modified Rankin  (Stroke Patients Only)       Balance Overall balance assessment: Needs assistance Sitting-balance support: No upper extremity supported Sitting balance-[name]: Fair Sitting balance - Comments: sat EOChair while complete rewrapping stump on the R LE, but had to use UE's to help reposition.     Standing balance-[name]: Poor                      Cognition Arousal/Alertness: Awake/alert Behavior During Therapy: Flat affect Overall Cognitive Status: Within Functional Limits for tasks assessed                      Exercises      General Comments        Pertinent Vitals/Pain Pain Assessment: 0-10 Pain Score: 10-Worst pain ever Pain Location: R LE Pain Descriptors / Indicators: Aching;Burning;Constant Pain Intervention(s): Monitored during session;Limited activity within patient's tolerance;Repositioned;Patient requesting pain meds-RN notified    Home Living                      Prior Function            PT Goals (current goals can now be found in the care plan section) Acute Rehab PT Goals Patient Stated Goal: Go back to rehab PT Goal Formulation: With patient Time For Goal Achievement: 03/06/14 Potential to Achieve Goals: Good Progress towards PT goals: Progressing toward goals    Frequency  Min 3X/week  PT Plan Current plan remains appropriate    Co-evaluation             End of Session   Activity Tolerance: Patient limited by pain Patient left: in bed;with call bell/phone within reach     Time: 1520-1558 PT Time Calculation (min) (ACUTE ONLY): 38 min  Charges:  $Therapeutic Activity: 23-37 mins $Self Care/Home Management: September 30, 2022                    G Codes:      [name], Tessie Fass 02/23/2014, 4:22 PM   02/23/2014  Donnella Sham, PT [phone]  (pager)

## 2014-02-23 NOTE — Clinical Social Work Psychosocial (Signed)
Clinical Social Work Department BRIEF PSYCHOSOCIAL ASSESSMENT 02/23/2014  Patient:  [name], [name]     Account Number:  [id number]     Admit date:  02/18/2014  Clinical Social Worker:  Lovey Newcomer  Date/Time:  02/23/2014 12:30 PM  Referred by:  Physician  Date Referred:  02/23/2014 Referred for  SNF Placement   Other Referral:   NA   Interview type:  Patient Other interview type:   Patient alert and oriented at time of assessment.    PSYCHOSOCIAL DATA Living Status:  PARENTS Admitted from facility:   Level of care:   Primary support name:  [name] and [name] Primary support relationship to patient:  FAMILY Degree of support available:   Support is good.    CURRENT CONCERNS Current Concerns  Post-Acute Placement   Other Concerns:   NA    SOCIAL WORK ASSESSMENT / PLAN CSW met with patient at bedside to complete assessment. Patient was admitted from [facility] where he was discharged to for rehab and wound care. Patient reports that his experience at the facility was "horrible" and hopes that he does not have to return to the facility. CSW explained that CIR admission is not guaranteed for the patient and that we would need to have a SNF backup plan in place. Patient would prefer not to return to [name] and hopes that something close to the area will be available.    Patient appears to have good insight into the reason for his hospitalization and is hopeful that he will not continue to have issues with his wound. CSW asked about patient's living situation prior to SNF. Patient stated that he lived in his own appartment, but had to give that up after his amputation. Patient states that he will likely have to move in with his mother if he is ever able to return home. Patient states that the past months "have been a struggle" because he has had to become used to his amputation, and he worries about where he will go after SNF/CIR, because his  mom's home is not that accessible. CSW validated patient's concerns and provided emotional support. CSW explained LOG SNF search process and answered patient's questions. CSW will follow up with any available bed offers.   Assessment/plan status:  Psychosocial Support/Ongoing Assessment of Needs Other assessment/ plan:   Complete FL2, Fax, PASRR   Information/referral to community resources:   CSW contact information and SNF list given.    PATIENT'S/FAMILY'S RESPONSE TO PLAN OF CARE: Patient is agreeable to SNF placement as backup to CIR. Patient is hopeful that CIR will be able to admit him once ready for DC. CSW will assist with DC if appropriate.       Liz Beach MSW, [place], [place], [phone]

## 2014-02-23 NOTE — Discharge Summary (Signed)
Name: [name] MRN: [id number] DOB: 1975/04/07 39 y.o. PCP: Victor Groves, DO  Date of Admission: 02/18/2014  1:32 AM Date of Discharge: 02/23/2014 Attending Physician: Victor Crews, MD  Discharge Diagnosis:  Right AKA due to BKA Stump Wound Dehiscence with Tibial Osteomyelitis and Posterior Flap Gangrene   Type 1 or 2 Diabetes Mellitus  History of Idiopathic LE DVT and PE Anemia of Chronic Disease  Non-Oliguric Acute Kidney Injury   Hypokalemia Neuropathic Pain  Hypertension  Anxiety & Depression   Hyperlipidemia GERD   Chronic Constipation      Discharge Medications:   Medication List    STOP taking these medications        insulin NPH-regular Human (70-30) 100 UNIT/ML injection  Commonly known as:  NOVOLIN 70/30     morphine 15 MG 12 hr tablet  Commonly known as:  MS CONTIN     oxyCODONE 15 MG immediate release tablet  Commonly known as:  ROXICODONE  Replaced by:  OxyCODONE 10 mg T12a 12 hr tablet     pantoprazole 40 MG tablet  Commonly known as:  PROTONIX      TAKE these medications        acetaminophen 325 MG tablet  Commonly known as:  TYLENOL  Take 1-2 tablets (325-650 mg total) by mouth every 4 (four) hours as needed for mild pain.     ALPRAZolam 0.25 MG tablet  Commonly known as:  XANAX  Take 1 tablet (0.25 mg total) by mouth 2 (two) times daily as needed for anxiety.     amLODipine 10 MG tablet  Commonly known as:  NORVASC  Take 1 tablet (10 mg total) by mouth daily.     apixaban 5 MG Tabs tablet  Commonly known as:  ELIQUIS  Take 1 tablet (5 mg total) by mouth 2 (two) times daily.     calcium carbonate 500 MG chewable tablet  Commonly known as:  TUMS - dosed in mg elemental calcium  Chew 1 tablet (200 mg of elemental calcium total) by mouth 3 (three) times daily with meals.     carvedilol 25 MG tablet  Commonly known as:  COREG  Take 1 tablet (25 mg total) by mouth 2 (two) times daily with a meal.     escitalopram 20  MG tablet  Commonly known as:  LEXAPRO  Take 1 tablet (20 mg total) by mouth at bedtime.     furosemide 40 MG tablet  Commonly known as:  LASIX  Take 1 tablet (40 mg total) by mouth daily.     gabapentin 300 MG capsule  Commonly known as:  NEURONTIN  Take 1 capsule (300 mg total) by mouth 3 (three) times daily.     insulin glargine 100 UNIT/ML injection  Commonly known as:  LANTUS  Inject 0.05 mLs (5 Units total) into the skin at bedtime.     lisinopril 10 MG tablet  Commonly known as:  PRINIVIL,ZESTRIL  Take 2 tablets (20 mg total) by mouth daily.     lovastatin 20 MG tablet  Commonly known as:  MEVACOR  Take 1 tablet (20 mg total) by mouth daily.     methocarbamol 500 MG tablet  Commonly known as:  ROBAXIN  Take 1 tablet (500 mg total) by mouth every 6 (six) hours as needed for muscle spasms.     multivitamin with minerals Tabs tablet  Take 1 tablet by mouth daily.     nortriptyline 10 MG capsule  Commonly  known as:  PAMELOR  Take 1 capsule (10 mg total) by mouth at bedtime.     omeprazole 20 MG capsule  Commonly known as:  PRILOSEC  Take 20 mg by mouth 2 (two) times daily before a meal.     OxyCODONE 10 mg T12a 12 hr tablet  Commonly known as:  OXYCONTIN  Take 1 tablet (10 mg total) by mouth every 12 (twelve) hours.     oxyCODONE-acetaminophen 5-325 MG per tablet  Commonly known as:  PERCOCET/ROXICET  Take 1 tablet by mouth every 4 (four) hours as needed for moderate pain or severe pain.     senna-docusate 8.6-50 MG per tablet  Commonly known as:  Senokot-S  Take 2 tablets by mouth 2 (two) times daily.     tamsulosin 0.4 MG Caps capsule  Commonly known as:  FLOMAX  Take 1 capsule (0.4 mg total) by mouth daily after supper.        Disposition and follow-up:   [name] was discharged from [name] in Good condition.  At the hospital follow up visit please address:  1.  Right AKA wound healing, non-weight bearing to right stump  and up with therapy. Change right stump dressing daily with 4x4s and kerlix and hold in place with stump shrinker sock. May shower and clean the incision with soap and water. Follow-up with [name] in 2 weeks on Friday Feb 26 @ 1: 00PM  Pain control - His pain was moderately to well controlled with dilaudid 0.5-1 mg Q12H PRN and percocet/roxicet 5-325 mg 1-2 tabs Q 4 hr PRN pain which was changed to  oxycontin 10 mg BID and  percocet/roxicet 5-325 mg 1 tabs Q 4 hr PRN pain, please adjust to level of control as needed.       Diabetes - anti-GAD negative, anti-ICA pending, unclear if Type 1 or 2. Started Lantus 5 U, was previously on Novolin 70-30 10 U BID with meals and metformin at home (assess when to restart metformin at hospital follow-up appt)        Hypertension - lisinopril was increased  from 10 mg to 20 mg daily. Adjust anti-hypertensives as necessary.        Needs to call [name] after SNF to schedule hospital follow-up appointment   2.  Labs / imaging needed at time of follow-up: CBC & BMP   3.  Pending labs/ test needing follow-up: Anti-islet cell antibody  Follow-up Appointments:   [name] Feb 26 at 1:00 PM  Discharge Instructions: Change right stump dressing daily with 4x4s and kerlix and hold in place with stump shrinker sock.  You may shower and clean the incision with soap and water.  Do not bear any weight on the end of the stump.  You have an appt with Dr. Merlinda Kelley on Feb 26 at 1:00 PM, call [phone] if you need to reschedule your follow up appointment or if you have any questions or concerns.  PLEASE MAKE AN APPOINTMENT WITH [name] after you come home from rehab, [phone], for hospital follow-up appointment.   START TAKING lisinopril 20 mg daily instead of 10 mg daily for high blood pressure.  Hold your metformin for now, start Lantus 5 U daily and stop taking Novolin 70/30 as you were before.    Consultations: Treatment  Team:  Victor Simmer, MD  Procedures Performed:  Dg Chest 2 View  01/30/2014   CLINICAL DATA:  Oxygen desaturation  EXAM: CHEST  2 VIEW  COMPARISON:  01/20/2014  FINDINGS: Marked hypo aeration, with interstitial crowding. No edema or pneumonia is suspected. Heart size is normal. Negative aortic and hilar contours.  IMPRESSION: Marked hypoventilation.   Electronically Signed   By: Jorje Guild M.D.   On: 01/30/2014 05:13   Dg Knee 1-2 Views Right  01/30/2014   CLINICAL DATA:  Status post recent RIGHT below-knee amputation, fall, assess for fracture or osteomyelitis as there is wound tenderness and drainage.  EXAM: RIGHT KNEE - 1-2 VIEW  COMPARISON:  None.  FINDINGS: Status post below-knee amputation, No disc directed bony lesions. No fracture deformity. No dislocation. No subcutaneous gas or radiopaque foreign bodies. Skin staples at the stump.  IMPRESSION: No acute fracture deformity nor dislocation. Status post below-knee amputation without radiographic findings of osteomyelitis.   Electronically Signed   By: Elon Alas   On: 01/30/2014 05:13   Nm Hepatobiliary Liver Func  02/01/2014   CLINICAL DATA:  Abdominal pain, nausea and elevated alkaline phosphatase.  EXAM: NUCLEAR MEDICINE HEPATOBILIARY IMAGING  TECHNIQUE: Sequential images of the abdomen were obtained out to 60 minutes following intravenous administration of radiopharmaceutical.  RADIOPHARMACEUTICALS:  5.0 Millic[employer]ie 123XX123 Choletec  COMPARISON:  None.  FINDINGS: Following the IV administration of the radiopharmaceutical there is uniform tracer uptake by the liver with clearance from blood pool. Tracer accumulation within the gallbladder is identified by 25 min. The common bile duct is patent and small bowel activity is identified at 20 min.  IMPRESSION: 1. No evidence for biliary obstruction. 2. Patent cystic duct without evidence for acute cholecystitis.   Electronically Signed   By: Kerby Moors M.D.   On: 02/01/2014 09:40   US  Abdomen Complete  01/31/2014   CLINICAL DATA:  Elevated liver enzymes. Epigastric pain. Persistent hematuria.  EXAM: ULTRASOUND ABDOMEN COMPLETE  COMPARISON:  12/21/2013  FINDINGS: Gallbladder: Gallbladder sludge. No wall thickening or focal tenderness.  Common bile duct: Diameter: 4 mm  Liver: There is a 12 mm right lobe mass which is echogenic and without visible color Doppler flow. This is most likely a hemangioma.  IVC: Limited imaging negative  Pancreas: Not visible  Spleen: Size and appearance within normal limits.  Right Kidney: Length: 13 cm, similar to abdominal CT 12/19/2013. Enlarged and echogenic. No hydronephrosis.  Left Kidney: Length: 13 cm, similar to abdominal CT. Enlarged echogenic. No hydronephrosis. The enlargement echogenicity of the bilateral kidneys is likely from diabetic nephropathy. Renal vein thrombosis is considered unlikely given symmetry, lack of definite enlargement since previous CT, and recently improved creatinine.  Abdominal aorta: No aneurysm visualized.  Other findings: None.  IMPRESSION: 1. Gallbladder sludge.  No evidence of cholecystitis. 2. Large and echogenic bilateral kidneys as seen with diabetic nephropathy. 3. 12 mm mass in the right liver, likely hemangioma.   Electronically Signed   By: Jorje Guild M.D.   On: 01/31/2014 22:46      Admission HPI: Original Author Dellia Nims, MD  39 yo male with DM type I, CKD stage 3, nephrotic syndrome, HTN, DVT (2006, 2011), PE- on eliquis, recent right leg BKA by Dr. Wylene Kelley on 12/22/2013 due to right foot abscess and wet gangrene. Failed I&D and limb savage attempts. Was in [place] and 12/18 to 01/20/14. [facility] with wound dehiscence. At that time xray was neg for fx or osteo, Dr [name] saw pt that tme, removed staples and follow up with wound care and at his office on 02/20/14. along with daily dressing  changes. During that admission he also had INR elevation, got vit K with improvement of INR. Was  switched from coumadin to eliquis 5mg  BID as his INR was difficult to control.  Went to [facility] at [place], [place]. He reports falling few times on the stump. From there he went to [facility], [place], [place] on 02/12/14 for bleeding and clotting around his right stump. He was taken to OR by Dr. Chauncey Mann on 02/12/14 for debriment, had 18x9 open wound per OP report, and also had hematoma evacuated. He had low hgb 9 with SOB, got 2 units pRBCs for symptomatic anemia with improvement. He was given 2-3 doses of vanc as well for concern for infection, but he did not have any fever there. Dr. Chauncey Mann recommended doing revision of his right BKA to AKA as there is not enough bone to support his muscle currently. He was transferred to [facility] for continuity of care and for possible AKA by [name].   Patient is having 6/10 pain on the right stump. Per patient, the bleeding has stopped. He had his last dressing change yesterday. We did not take off his dressing as there is a open wound underneath. He denies any other complaint. He had some trouble emptying his bladder post op there but was voiding fine afterwards. Denies any burning with urination. Denies any fever/chills, n/v, diarrhea.    Hospital Course by problem list:  Right AKA due to BKA Stump Wound Dehiscence with Tibial Osteomyelitis and Posterior FlapGangrene - Pt was s/p right BKA by [name] on 12/22/2013 due to right foot abscesses and wet gangrene in setting of diabetes mellitus. At that time he attended rehab and was transferred to a hospital in [place] for bleeding and clotting around R BKA stump. He had a hematoma evacuated in addition to surgical debridement requiring blood transfusion for symptomatic anemia. The surgical team there felt he needed a revision of his R BKA to AKA and was for this reason sent to [facility]. He was seen by [name] who felt there was extensive necrosis of the posterior flap that required AKA which was performed  on [id number] with no complications and minimal estimated blood loss. He received 2 days of IV vancomycin. Surgical pathology revealed ulcerated benign skin with underlying fat necrosis and scattered inflammation with mild calcific atherosclerotic disease. He is on chronic eliquis for [facility] therapy which was continued for DVT prophylaxsis. Per [name] non-weight bearing to right stump and up with therapy. He is have right stump dressing changes daily with 4x4s and kerlix and hold in place with stump shrinker sock. He may shower and clean the incision with soap and water. His dressings were frequently changed. He had physical and occupational therapy sessions during hospitalization who reccommended SNF placement which he was agreeable to. His pain was moderately to well controlled with dilaudid 0.5-1 mg Q12H PRN and percocet/roxicet 5-325 mg 1-2 tabs Q 4 hr PRN pain which was changed to oxycontin 10 mg BID and  percocet/roxicet 5-325 mg 1 tabs Q 4 hr PRN pain per pharmacy recommendations. He is to follow-up with orthopedic surgeon [name] in 2 weeks on February 26 at 1:00 PM.   Type 1 or 2 Diabetes Mellitus - Pt with last A1c 6.8 on 01/30/14. He has questionable Type 1 diabetes as his anti-GAD was negative. Anti-ISA Ab was pending at time of discharge. Pt was on Novolin (70/30) 10 U BID which was changed to Lantus 5 units daily at  bedtime and sensitive SSI with meals with close monitoring of blood sugars during hospitalization with range of 109-186. Pt had no episodes of symptomatic hypoglycemia during hospitalization.  He is to have outpatient follow-up with [facility] to adjust insulin regimen as needed. He was also on metformin at home which was held during hospitalization and to have hospital follow-up to determine when it is to be resumed.   History of Idiopathic LE DVT and PE - Pt was continued on Eliquis 5 mg BID during hospitalization with no signs of venous thromboembolism.   Anemia of Chronic Disease - Pt  with last anemia panel on 12/15/13 consistent with ACD. Pt with hemoglobin range of  8.7 to 9.3 which was at baseline of 8-9 during hospitalization with no active bleeding or hemodynamic stability.    Non-Oliguric Acute Kidney Injury - Pt with Cr of 1.31 on admission that improved to 1.10 during hospitalization with IVF's most likely due to pre-renal azotemia. Pt with current GFR>90.  Pt with documented history of nephrotic syndrome and CKD Stage 3 however GFR currently normal.    Hypokalemia - Pt with potassium level of 3.2 on 02/24/14 that was repleted with supplementation during hospitalization.  Neuropathic Pain - Pt received home gabapentin 300 mg TID and nortriptyline 10 mg daily during hospitalization with moderate to well-controlled symptoms.    Hypertension - Pt was continued on home amlodipine 10 mg daily, carvedilol 25 mg BID, furosemide 40 mg daily and lisinopril 10 mg daily with blood pressure range of 126/92 to 182/76 during hospitalization.  His lisinopril was increased from 10 to 20 mg daily due to hypertension. Pt to have outpatient follow-up for further adjustment of anti-hypertensives.   Anxiety and Depression - Pt with stable mood during hospitalization. He was continued on home lexapro 20 mg daily and xanax 0.25 mg BID as needed for anxiety.   Hyperlipidemia - Pt with last lipid panel on 02/19/13 with Chol 258, TG 329, HDL 45, and LDL 147. He was continued on home pravastatin 20 mg daily instead of home lovastatin 20 mg daily due to hospital formulary.    GERD - Pt was on  protonix 40 mg BID with no acid reflux symptoms during hospitalization. He was at home on prilosec 20 mg BID which is to be continued on discharge.    Chronic Constipation - Pt was continued on home Sennokot-S BID with well-controlled symptoms.   Urinary Retention  - Pt was continued on home tamsulosin 0.4 mg daily with well-controlled symptoms.   DVT Prophylaxis - Pt was continued on home eliquis 5 mg BID  during hospitalization with no signs of DVT.   Discharge Vitals:   BP 157/77 mmHg  Pulse 93  Temp(Src) 97.7 F (36.5 C) (Oral)  Resp 18  Ht 6\' 2"  (1.88 m)  Wt 332 lb 8 oz (150.821 kg)  BMI 42.67 kg/m2  SpO2 95%  Discharge Labs:  Results for orders placed or performed during the hospital encounter of 02/18/14 (from the past 24 hour(s))  Glucose, capillary     Status: Abnormal   Collection Time: 02/22/14  9:24 PM  Result Value Ref Range   Glucose-Capillary 136 (H) 70 - 99 mg/dL   Comment 1 Notify RN   Glucose, capillary     Status: Abnormal   Collection Time: 02/23/14  8:15 AM  Result Value Ref Range   Glucose-Capillary 133 (H) 70 - 99 mg/dL  Glucose, capillary     Status: Abnormal   Collection Time: 02/23/14 12:27 PM  Result Value Ref Range   Glucose-Capillary 165 (H) 70 - 99 mg/dL  Glucose, capillary     Status: Abnormal   Collection Time: 02/23/14  4:51 PM  Result Value Ref Range   Glucose-Capillary 150 (H) 70 - 99 mg/dL    Signed: Juluis Mire, MD 02/23/2014, 8:42 PM    Services Ordered on Discharge: SNF Equipment Ordered on Discharge: None

## 2014-02-23 NOTE — Progress Notes (Signed)
Subjective:  Pt seen and examined in AM. No acute events overnight. He reports his pain better controlled with dilaudid than morphine. He has been having difficulty getting back to his bed from the bathroom and would like either a sliding board or bedside commode rails.  Per ortho who dressed wound today there was one small area of scant bleeding medially however no purulence or cellulitis.   Objective: Vital signs in last 24 hours: Filed Vitals:   02/22/14 1438 02/22/14 2126 02/23/14 0915 02/23/14 0916  BP: 157/78 159/85 155/86 155/86  Pulse: 98 96    Temp: 98.6 F (37 C) 99.1 F (37.3 C)    TempSrc: Oral Oral    Resp: 20 20    Height:      Weight:      SpO2: 92% 94%     Weight change:   Intake/Output Summary (Last 24 hours) at 02/23/14 1115 Last data filed at 02/23/14 0335  Gross per 24 hour  Intake   1197 ml  Output   1700 ml  Net   -503 ml   Physical Exam General: alert, sitting up in bed, better mood HEENT: [place]/AT, EOMI, mucus membranes moist CV: RRR, no m/g/r Pulm: CTA bilaterally, breaths non-labored Abd: BS+, soft, obese, non-tender Ext: R AKA stump covered with ace bandage/dressing. Left leg with SCD. Neuro:  Alert and oriented x 3, no focal deficits   Lab Results: Basic Metabolic Panel:  Recent Labs Lab 02/19/14 0049 02/21/14 0448  NA 138 136  K 3.8 3.7  CL 105 104  CO2 27 20  GLUCOSE 130* 144*  BUN 20 13  CREATININE 1.15 1.15  CALCIUM 8.6 7.8*   CBC:  Recent Labs Lab 02/20/14 0557 02/21/14 0448  WBC 17.1* 16.0*  HGB 8.7* 9.3*  HCT 27.2* 29.1*  MCV 86.1 84.8  PLT 485* 532*   CBG:  Recent Labs Lab 02/21/14 2154 02/22/14 0829 02/22/14 1302 02/22/14 1752 02/22/14 2124 02/23/14 0815  GLUCAP 139* 129* 176* 141* 136* 133*   Fasting Lipid Panel:  Recent Labs Lab 02/19/14 0050  CHOL 94  HDL 23*  LDLCALC 53  TRIG 91  CHOLHDL 4.1   Medications: I have reviewed the patient's current medications. Scheduled Meds: . amLODipine   10 mg Oral Daily  . apixaban  5 mg Oral BID  . calcium carbonate  1 tablet Oral TID WC  . carvedilol  25 mg Oral BID WC  . escitalopram  20 mg Oral QHS  . furosemide  40 mg Oral Daily  . gabapentin  300 mg Oral TID  . insulin aspart  0-9 Units Subcutaneous TID WC  . insulin glargine  5 Units Subcutaneous QHS  . lisinopril  10 mg Oral Daily  . multivitamin with minerals  1 tablet Oral Daily  . nortriptyline  10 mg Oral QHS  . pantoprazole  40 mg Oral BID  . pravastatin  20 mg Oral q1800  . senna-docusate  2 tablet Oral BID  . tamsulosin  0.4 mg Oral QPC supper   Continuous Infusions:   PRN Meds:.ALPRAZolam, HYDROmorphone (DILAUDID) injection, metoCLOPramide **OR** metoCLOPramide (REGLAN) injection, ondansetron **OR** ondansetron (ZOFRAN) IV, oxyCODONE-acetaminophen, sodium chloride Assessment/Plan:  S/p R Leg AKA due to Wound Dehiscence/Tibial Osteomyelitis/Gangrene: Ortho performed right AKA on 2/11, pt is POD # 4. Pt with better controlled pain.  -Ortho following, appreciate recommendations  -Follow-up surgical pathology -Dressing changed today by Dr. Doran Durand -Continue Dilaudid 0.5-1 mg Q12H PRN and percocet/roxicet 5-325 mg 1-2 tabs Q 4  hr PRN pain  -Continue PT and OT with non-weight bearing on the right LE, request for bedside commode with rails or sliding board -CIR consulted for inpatient rehab admission however had previous stay and has lack of social support for safe discharge and would recommend SNF instead -Case management consult for SNF placment  Type 1? Diabetes Mellitus: Last CBG 133. Last HbA1c 6.8 on 01/30/14. Questionable Type 1, anti-GAD negative.  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 No active bleeding and Hg stable.  -Continue Eliquis 5 mg BID   Anemia of Chronic Disease: Hg stable at 9.3. Baseline is 8-9.  - CBC  intermittently   AKI: Cr 1.31 on admission, 1.15 on 2/13. GFR>90. Unclear baseline.   - Monitor BMP  - Avoid nephrotoxins - Monitor daily weights and strict I & O's  Neuropathic Pain: Controlled. - Continue Gabapentin 300 mg TID - Continue Nortriptyline 10 mg daily   HTN: SBP in 150s. Patient takes Amloidpine, Coreg, Lasix, and Lisinopril at home. - Continue Amlodipine 10 mg daily - Continue Coreg 25 mg BID - Continue Lasix 40 mg daily - Increase Lisinopril from 10 to 20 mg daily  HLD: Lipid panel on 02/19/13 shows Chol 258, Trigly 329, HDL 45, and LDL 147. Patient takes Pravastatin at home. - Continue Pravastatin 20 mg daily   GERD: -Continue Protonix 40 mg BID  Constipation:  -Continue Sennokot-S BID   Diet: Carb modified  VTE Ppx: Eliquis  Code: FULL Dispo: Awaiting SNF placement  The patient does have a current PCP Lucious Groves, DO) and does need an [facility] hospital follow-up appointment after discharge.  The patient does not have transportation limitations that hinder transportation to clinic appointments.  .Services Needed at time of discharge: Y = Yes, Blank = No PT:   OT:   RN:   Equipment:   Other:  SNF    LOS: 5 days   Juluis Mire, MD 02/23/2014, 11:15 AM

## 2014-02-23 NOTE — Progress Notes (Signed)
RN went to check on patient. Asked patient how he was feeling. Patient did not verbally respond. Only stared back at [employer]. RN asked patient if he was in pain. Patient nodded "yes". RN asked patient for pain score. Patient would not verbally respond, would only stare back at RN. RN informed patient that pain medication could not be given if he did not provide a pain score to assess appropriateness of medication. Patient refused to respond. RN informed patient to call if he needed pain medication and could provide pain score. No obvious distress noted. Will continue to monitor.

## 2014-02-23 NOTE — Clinical Social Work Placement (Signed)
Clinical Social Work Department CLINICAL SOCIAL WORK PLACEMENT NOTE 02/23/2014  Patient:  [name], [name]  Account Number:  [id number] Admit date:  02/18/2014  Clinical Social Worker:  Kemper Durie, [place]  Date/time:  02/23/2014 04:47 PM  Clinical Social Work is seeking post-discharge placement for this patient at the following level of care:   [place]   (*CSW will update this form in Epic as items are completed)   02/23/2014  Patient/family provided with [place] Department of Clinical Social Work's list of facilities offering this level of care within the geographic area requested by the patient (or if unable, by the patient's family).  02/23/2014  Patient/family informed of their freedom to choose among providers that offer the needed level of care, that participate in Medicare, Medicaid or managed care program needed by the patient, have an available bed and are willing to accept the patient.  02/23/2014  Patient/family informed of [facility]' ownership interest in [facility], as well as of the fact that they are under no obligation to receive care at this facility.  PASARR submitted to EDS on 02/23/2014 PASARR number received on 02/23/2014  FL2 transmitted to all facilities in geographic area requested by pt/family on  02/23/2014 FL2 transmitted to all facilities within larger geographic area on 02/23/2014  Patient informed that his/her managed care company has contracts with or will negotiate with  certain facilities, including the following:     Patient/family informed of bed offers received:   Patient chooses bed at  Physician recommends and patient chooses bed at    Patient to be transferred to  on   Patient to be transferred to facility by  Patient and family notified of transfer on  Name of family member notified:    The following physician request were entered in Epic:   Additional Comments:   Liz Beach MSW, [place], [place],  JI:[id number]

## 2014-02-24 DIAGNOSIS — Z89521 Acquired absence of right knee: Secondary | ICD-10-CM

## 2014-02-24 DIAGNOSIS — E1051 Type 1 diabetes mellitus with diabetic peripheral angiopathy without gangrene: Secondary | ICD-10-CM

## 2014-02-24 DIAGNOSIS — Z48817 Encounter for surgical aftercare following surgery on the skin and subcutaneous tissue: Secondary | ICD-10-CM

## 2014-02-24 DIAGNOSIS — E1022 Type 1 diabetes mellitus with diabetic chronic kidney disease: Secondary | ICD-10-CM

## 2014-02-24 DIAGNOSIS — R339 Retention of urine, unspecified: Secondary | ICD-10-CM

## 2014-02-24 DIAGNOSIS — L0231 Cutaneous abscess of buttock: Secondary | ICD-10-CM

## 2014-02-24 DIAGNOSIS — F419 Anxiety disorder, unspecified: Secondary | ICD-10-CM

## 2014-02-24 DIAGNOSIS — N183 Chronic kidney disease, stage 3 (moderate): Secondary | ICD-10-CM

## 2014-02-24 DIAGNOSIS — I10 Essential (primary) hypertension: Secondary | ICD-10-CM

## 2014-02-24 DIAGNOSIS — F329 Major depressive disorder, single episode, unspecified: Secondary | ICD-10-CM

## 2014-02-24 LAB — CBC
HEMATOCRIT: 27.9 % — AB (ref 39.0–52.0)
HEMOGLOBIN: 8.9 g/dL — AB (ref 13.0–17.0)
MCH: 26.8 pg (ref 26.0–34.0)
MCHC: 31.9 g/dL (ref 30.0–36.0)
MCV: 84 fL (ref 78.0–100.0)
PLATELETS: 446 10*3/uL — AB (ref 150–400)
RBC: 3.32 MIL/uL — ABNORMAL LOW (ref 4.22–5.81)
RDW: 14.2 % (ref 11.5–15.5)
WBC: 12.2 10*3/uL — ABNORMAL HIGH (ref 4.0–10.5)

## 2014-02-24 LAB — GLUCOSE, CAPILLARY
GLUCOSE-CAPILLARY: 113 mg/dL — AB (ref 70–99)
Glucose-Capillary: 173 mg/dL — ABNORMAL HIGH (ref 70–99)
Glucose-Capillary: 153 mg/dL — ABNORMAL HIGH (ref 70–99)

## 2014-02-24 LAB — BASIC METABOLIC PANEL
ANION GAP: 10 (ref 5–15)
BUN: 13 mg/dL (ref 6–23)
CHLORIDE: 106 mmol/L (ref 96–112)
CO2: 21 mmol/L (ref 19–32)
Calcium: 7.5 mg/dL — ABNORMAL LOW (ref 8.4–10.5)
Creatinine, Ser: 1.1 mg/dL (ref 0.50–1.35)
GFR calc Af Amer: >90 mL/min (ref >90)
GFR calc non Af Amer: 84 mL/min — ABNORMAL LOW (ref >90)
Glucose, Bld: 117 mg/dL — ABNORMAL HIGH (ref 70–99)
POTASSIUM: 3.2 mmol/L — AB (ref 3.5–5.1)
SODIUM: 137 mmol/L (ref 135–145)

## 2014-02-24 LAB — MAGNESIUM: Magnesium: 1.6 mg/dL (ref 1.5–2.5)

## 2014-02-24 MED ORDER — INSULIN GLARGINE 100 UNIT/ML ~~LOC~~ SOLN: 5.0000 [IU] | Freq: Every day | SUBCUTANEOUS | Status: DC

## 2014-02-24 MED ORDER — OXYCODONE-ACETAMINOPHEN 5-325 MG PO TABS: 1.0000 | ORAL_TABLET | ORAL | Status: DC | PRN

## 2014-02-24 MED ORDER — OXYCODONE HCL ER 10 MG PO T12A: 10.0000 mg | EXTENDED_RELEASE_TABLET | Freq: Two times a day (BID) | ORAL | Status: DC

## 2014-02-24 MED ORDER — POTASSIUM CHLORIDE CRYS ER 20 MEQ PO TBCR
40.0000 meq | EXTENDED_RELEASE_TABLET | Freq: Once | ORAL | Status: AC
  Administered 2014-02-24: 40 meq via ORAL
  Filled 2014-02-24: qty 2

## 2014-02-24 MED ORDER — OXYCODONE HCL ER 10 MG PO T12A
10.0000 mg | EXTENDED_RELEASE_TABLET | Freq: Two times a day (BID) | ORAL | Status: DC
  Administered 2014-02-24: 10 mg via ORAL
  Filled 2014-02-24: qty 1

## 2014-02-24 MED ORDER — LISINOPRIL 10 MG PO TABS: 20.0000 mg | ORAL_TABLET | Freq: Every day | ORAL | Status: DC

## 2014-02-24 NOTE — Discharge Instructions (Addendum)
Change right stump dressing daily with 4x4s and kerlix and hold in place with stump shrinker sock.  You may shower and clean the incision with soap and water.  Do not bear any weight on the end of the stump.  You have an appt with Dr. Merlinda Frederick on Feb 26 at 1:00 PM, call [phone] if you need to reschedule your follow up appointment or if you have any questions or concerns.  [name], MD [place] after you come home from rehab, [phone], for hospital follow-up appointment.   START TAKING lisinopril 20 mg daily instead of 10 mg daily for high blood pressure.  Hold your metformin for now, start Lantus 5 U daily and stop taking Novolin 70/30 as you were before.

## 2014-02-24 NOTE — Clinical Social Work Note (Signed)
CSW has found patient a bed at [facility] for today. Patient is insisting that he speaks with someone in CIR regarding why he was not accepted by the program. CSW explained that this was likely due to his lack of support at discharge, but he continues to insist that he speak with someone from the program. IP rehab admissions coordinator will meet with patient to offer explanation for no CIR admission. Patient continues to list reasons for why he cannot discharge today (clothing still at other facility, he needs time process having to leave the hospital etc.). CSW explained that none of the reasons he lists is a reason for him to remain in the hospital. CSW has faxed [facility] and the facility is waiting for his arrival. If patient ultimately chooses not to go to [facility], then he needs to be discharged to home as CIR is not a viable option and [place] is the only SNF option. DC packet has been left on patient's chart along with number for report.    In case patient is still here after CSW leaves for the day, transport can be reached at [phone]. If after 5:00PM, transport can be reached at [phone] (option 1, option 3).    Liz Beach MSW, [place], [place], JI:[id number]

## 2014-02-24 NOTE — Progress Notes (Signed)
Subjective:  Pt seen and examined in AM. No acute events overnight. He continues to have right stump pain and is agreeable in starting long acting opiates. His lisinopril was increased from 10 to 20 mg daily yesterday due to hypertension. He would like to go to CIR if possible but understands that this may not be possible. He worked hard yesterday with PT.    Objective: Vital signs in last 24 hours: Filed Vitals:   02/23/14 1517 02/23/14 1616 02/23/14 2208 02/24/14 0638  BP: 154/84 157/77 152/75 155/81  Pulse: 93  90 99  Temp: 97.7 F (36.5 C)  98.4 F (36.9 C) 98.5 F (36.9 C)  TempSrc: Oral  Oral Oral  Resp: 18  18 16   Height:      Weight:      SpO2: 95%  99% 99%   Weight change:   Intake/Output Summary (Last 24 hours) at 02/24/14 1146 Last data filed at 02/24/14 1010  Gross per 24 hour  Intake    120 ml  Output    500 ml  Net   -380 ml   Physical Exam General: alert, sitting up in bed,  CV: RRR, no m/g/r Pulm: CTA bilaterally, breaths non-labored Abd: BS+, soft, obese, non-tender Ext: R AKA stump covered with ace bandage/dressing. Left leg with SCD. Neuro: Alert and oriented x 3  Lab Results: Basic Metabolic Panel:  Recent Labs Lab 02/21/14 0448 02/24/14 0630  NA 136 137  K 3.7 3.2*  CL 104 106  CO2 20 21  GLUCOSE 144* 117*  BUN 13 13  CREATININE 1.15 1.10  CALCIUM 7.8* 7.5*   CBC:  Recent Labs Lab 02/21/14 0448 02/24/14 0630  WBC 16.0* 12.2*  HGB 9.3* 8.9*  HCT 29.1* 27.9*  MCV 84.8 84.0  PLT 532* 446*   CBG:  Recent Labs Lab 02/23/14 0815 02/23/14 1227 02/23/14 1651 02/23/14 2204 02/24/14 0822 02/24/14 1122  GLUCAP 133* 165* 150* 135* 113* 173*   Fasting Lipid Panel:  Recent Labs Lab 02/19/14 0050  CHOL 94  HDL 23*  LDLCALC 53  TRIG 91  CHOLHDL 4.1   Medications: I have reviewed the patient's current medications. Scheduled Meds: . amLODipine  10 mg Oral Daily  . apixaban  5 mg Oral BID  . calcium carbonate  1 tablet  Oral TID WC  . carvedilol  25 mg Oral BID WC  . escitalopram  20 mg Oral QHS  . furosemide  40 mg Oral Daily  . gabapentin  300 mg Oral TID  . insulin aspart  0-9 Units Subcutaneous TID WC  . insulin glargine  5 Units Subcutaneous QHS  . lisinopril  20 mg Oral Daily  . multivitamin with minerals  1 tablet Oral Daily  . nortriptyline  10 mg Oral QHS  . pantoprazole  40 mg Oral BID  . pravastatin  20 mg Oral q1800  . senna-docusate  2 tablet Oral BID  . tamsulosin  0.4 mg Oral QPC supper   Continuous Infusions:   PRN Meds:.ALPRAZolam, HYDROmorphone (DILAUDID) injection, metoCLOPramide **OR** metoCLOPramide (REGLAN) injection, ondansetron **OR** ondansetron (ZOFRAN) IV, oxyCODONE-acetaminophen, sodium chloride Assessment/Plan:  S/p R Leg AKA due to Wound Dehiscence/Tibial Osteomyelitis/Gangrene: Ortho performed right AKA on 2/11, pt is POD # 5. Pt with better controlled pain. Surgical pathology revealed ulcerated benign skin with underlying fat necrosis and scattered inflammation with mild calcific atherosclerotic disease -Ortho following, appreciate recommendations  -Follow-up surgical pathology -Dressing changed today by Dr. Doran Durand on 02/23/14 -Convert Dilaudid 0.5-1 mg  Q12H PRN to oxycontin 10 mg BID and change percocet/roxicet 5-325 mg from 1-2 tabs to 1 tab Q 4 hr PRN pain per pharmacy for reommendations -Continue PT and OT with non-weight bearing on the right LE, request made to PT for bedside commode with rails or sliding board -Awaiting SNF placement  Hypokalemia - K 3.2 this AM.  -Replete with kdur 40 mEq daily  -Monitor BMP and add on Mg level   Type 1? Diabetes Mellitus: Last CBG 173. Last HbA1c 6.8 on 01/30/14. Questionable Type 1, anti-GAD negative.  - Follow-up anti-ISA Ab  - Continue Lantus 5 units QHS and sensitive SSI with meals - Hold home metformin - Monitor CBGs 4 times daily   Hx Idiopathic DVT in LLE and PE: On Eliquis at home. No active bleeding and Hg  stable.  -Continue Eliquis 5 mg BID   Anemia of Chronic Disease: Hg stable at 8.9 near baseline 8-9.  - CBC intermittently   AKI: Resolved. Cr 1.10 down from 1.31 on admission. GFR>90. Unclear baseline.   - Monitor BMP  - Avoid nephrotoxins - Monitor daily weights and strict I & O's  Neuropathic Pain: Controlled. - Continue Gabapentin 300 mg TID - Continue Nortriptyline 10 mg daily   HTN: SBP in 150s. Patient takes Amloidpine, Coreg, Lasix, and Lisinopril at home. - Continue Amlodipine 10 mg daily - Continue Coreg 25 mg BID - Continue Lasix 40 mg daily - Continue newly increased Lisinopril 20 mg daily  HLD: Lipid panel on 02/19/13 shows Chol 258, Trigly 329, HDL 45, and LDL 147. Patient takes Pravastatin at home. - Continue Pravastatin 20 mg daily   GERD: -Continue Protonix 40 mg BID  Constipation:  -Continue Sennokot-S BID   Diet: Carb modified  VTE Ppx: Eliquis  Code: FULL Dispo: Awaiting SNF placement  The patient does have a current PCP Lucious Groves, DO) and does need an [facility] hospital follow-up appointment after discharge.  The patient does not have transportation limitations that hinder transportation to clinic appointments.  .Services Needed at time of discharge: Y = Yes, Blank = No PT:   OT:   RN:   Equipment:   Other:  SNF    LOS: 6 days   Juluis Mire, MD 02/24/2014, 11:46 AM

## 2014-02-24 NOTE — Progress Notes (Signed)
Report called to Helene Kelp, Therapist, sports at [facility].

## 2014-02-24 NOTE — Progress Notes (Signed)
Physical Therapy Treatment Patient Details Name: Deval Mcfarren MRN: [id number] DOB: [birth year] Today's Date: 02/24/2014    History of Present Illness      PT Comments    Pt would benefit from sliding board, drop-arm BSC and drop-arm recliner.  Follow Up Recommendations  SNF;Supervision/Assistance - 24 hour     Equipment Recommendations  None recommended by PT    Recommendations for Other Services       Precautions / Restrictions Precautions Precautions: Fall Precaution Comments: New Rt AKA, with protector Restrictions Weight Bearing Restrictions: Yes RLE Weight Bearing: Non weight bearing    Mobility  Bed Mobility         Supine to sit: Mod assist     General bed mobility comments: continuous verbal cues throughout mobility for sequencing, motivation, and to stay on task  Transfers     Transfers: Squat Pivot Transfers     Squat pivot transfers: +2 physical assistance;Max assist     General transfer comment: continuous verbal cues throughout for sequencing, safety, and to stay on task  Ambulation/Gait                 Stairs            Wheelchair Mobility    Modified Rankin (Stroke Patients Only)       Balance                                    Cognition Arousal/Alertness: Awake/alert Behavior During Therapy: Flat affect Overall Cognitive Status: Within Functional Limits for tasks assessed                      Exercises      General Comments        Pertinent Vitals/Pain Pain Assessment: 0-10 Pain Score: 8  Pain Location: RLE Pain Intervention(s): Monitored during session;Limited activity within patient's tolerance    Home Living                      Prior Function            PT Goals (current goals can now be found in the care plan section) Progress towards PT goals: Progressing toward goals    Frequency  Min 3X/week    PT Plan Discharge plan needs to be updated     Co-evaluation             End of Session Equipment Utilized During Treatment: Gait belt Activity Tolerance: Patient limited by pain Patient left: in chair;with call bell/phone within reach     Time: 0921-0952 PT Time Calculation (min) (ACUTE ONLY): 31 min  Charges:  $Therapeutic Activity: 23-37 mins                    G Codes:      Lorriane Shire 02/24/2014, 10:19 AM

## 2014-02-25 LAB — ANTI-ISLET CELL ANTIBODY: Pancreatic Islet Cell Antibody: <5 JDF Units (ref <5)

## 2014-08-12 DIAGNOSIS — R600 Localized edema: Secondary | ICD-10-CM | POA: Insufficient documentation

## 2014-08-12 DIAGNOSIS — E1165 Type 2 diabetes mellitus with hyperglycemia: Secondary | ICD-10-CM | POA: Insufficient documentation

## 2014-08-12 DIAGNOSIS — E785 Hyperlipidemia, unspecified: Secondary | ICD-10-CM | POA: Insufficient documentation

## 2014-10-29 ENCOUNTER — Encounter: Payer: Medicaid Other | Admitting: Internal Medicine

## 2014-10-29 ENCOUNTER — Encounter: Payer: Self-pay | Admitting: Internal Medicine

## 2014-11-10 ENCOUNTER — Telehealth: Payer: Self-pay | Admitting: Dietician

## 2014-11-10 ENCOUNTER — Encounter: Payer: Self-pay | Admitting: Dietician

## 2014-11-10 NOTE — Telephone Encounter (Signed)
Patient calls from rehab for diabetes information and support. He says he will be getting his prosthesis soon. CDE mailed requested information to him and encouraged appointment as soon as he leaves rehab.

## 2015-02-09 DIAGNOSIS — N183 Chronic kidney disease, stage 3 (moderate): Secondary | ICD-10-CM | POA: Diagnosis not present

## 2015-02-09 DIAGNOSIS — R339 Retention of urine, unspecified: Secondary | ICD-10-CM | POA: Diagnosis not present

## 2015-02-09 DIAGNOSIS — Z794 Long term (current) use of insulin: Secondary | ICD-10-CM | POA: Diagnosis not present

## 2015-02-09 DIAGNOSIS — Z86718 Personal history of other venous thrombosis and embolism: Secondary | ICD-10-CM | POA: Diagnosis not present

## 2015-02-09 DIAGNOSIS — E1122 Type 2 diabetes mellitus with diabetic chronic kidney disease: Secondary | ICD-10-CM | POA: Diagnosis not present

## 2015-02-09 DIAGNOSIS — D649 Anemia, unspecified: Secondary | ICD-10-CM | POA: Diagnosis not present

## 2015-02-09 DIAGNOSIS — I10 Essential (primary) hypertension: Secondary | ICD-10-CM | POA: Diagnosis not present

## 2015-02-09 DIAGNOSIS — E119 Type 2 diabetes mellitus without complications: Secondary | ICD-10-CM | POA: Diagnosis not present

## 2015-03-03 ENCOUNTER — Other Ambulatory Visit: Payer: Self-pay | Admitting: Internal Medicine

## 2015-04-05 DIAGNOSIS — N183 Chronic kidney disease, stage 3 (moderate): Secondary | ICD-10-CM | POA: Diagnosis not present

## 2015-04-05 DIAGNOSIS — Z86718 Personal history of other venous thrombosis and embolism: Secondary | ICD-10-CM | POA: Diagnosis not present

## 2015-04-05 DIAGNOSIS — E1122 Type 2 diabetes mellitus with diabetic chronic kidney disease: Secondary | ICD-10-CM | POA: Diagnosis not present

## 2015-04-05 DIAGNOSIS — I10 Essential (primary) hypertension: Secondary | ICD-10-CM | POA: Diagnosis not present

## 2015-04-05 DIAGNOSIS — Z794 Long term (current) use of insulin: Secondary | ICD-10-CM | POA: Diagnosis not present

## 2015-04-05 DIAGNOSIS — E119 Type 2 diabetes mellitus without complications: Secondary | ICD-10-CM | POA: Diagnosis not present

## 2015-04-05 DIAGNOSIS — Z89619 Acquired absence of unspecified leg above knee: Secondary | ICD-10-CM | POA: Diagnosis not present

## 2015-04-05 DIAGNOSIS — D649 Anemia, unspecified: Secondary | ICD-10-CM | POA: Diagnosis not present

## 2015-05-10 DIAGNOSIS — R197 Diarrhea, unspecified: Secondary | ICD-10-CM | POA: Diagnosis not present

## 2015-05-24 DIAGNOSIS — F5101 Primary insomnia: Secondary | ICD-10-CM | POA: Diagnosis not present

## 2015-05-24 DIAGNOSIS — G4709 Other insomnia: Secondary | ICD-10-CM | POA: Diagnosis not present

## 2015-06-02 DIAGNOSIS — I10 Essential (primary) hypertension: Secondary | ICD-10-CM | POA: Diagnosis not present

## 2015-06-02 DIAGNOSIS — Z89619 Acquired absence of unspecified leg above knee: Secondary | ICD-10-CM | POA: Diagnosis not present

## 2015-06-02 DIAGNOSIS — Z86718 Personal history of other venous thrombosis and embolism: Secondary | ICD-10-CM | POA: Diagnosis not present

## 2015-06-02 DIAGNOSIS — E1122 Type 2 diabetes mellitus with diabetic chronic kidney disease: Secondary | ICD-10-CM | POA: Diagnosis not present

## 2015-06-02 DIAGNOSIS — D649 Anemia, unspecified: Secondary | ICD-10-CM | POA: Diagnosis not present

## 2015-06-02 DIAGNOSIS — N183 Chronic kidney disease, stage 3 (moderate): Secondary | ICD-10-CM | POA: Diagnosis not present

## 2015-06-02 DIAGNOSIS — Z794 Long term (current) use of insulin: Secondary | ICD-10-CM | POA: Diagnosis not present

## 2015-06-02 DIAGNOSIS — E119 Type 2 diabetes mellitus without complications: Secondary | ICD-10-CM | POA: Diagnosis not present

## 2015-06-06 DIAGNOSIS — D508 Other iron deficiency anemias: Secondary | ICD-10-CM | POA: Diagnosis not present

## 2015-06-06 DIAGNOSIS — I10 Essential (primary) hypertension: Secondary | ICD-10-CM | POA: Diagnosis not present

## 2015-06-06 DIAGNOSIS — E1165 Type 2 diabetes mellitus with hyperglycemia: Secondary | ICD-10-CM | POA: Diagnosis not present

## 2015-06-06 DIAGNOSIS — E784 Other hyperlipidemia: Secondary | ICD-10-CM | POA: Diagnosis not present

## 2015-07-02 DIAGNOSIS — E1165 Type 2 diabetes mellitus with hyperglycemia: Secondary | ICD-10-CM | POA: Diagnosis not present

## 2015-07-02 DIAGNOSIS — D508 Other iron deficiency anemias: Secondary | ICD-10-CM | POA: Diagnosis not present

## 2015-07-02 DIAGNOSIS — I10 Essential (primary) hypertension: Secondary | ICD-10-CM | POA: Diagnosis not present

## 2015-07-02 DIAGNOSIS — E784 Other hyperlipidemia: Secondary | ICD-10-CM | POA: Diagnosis not present

## 2015-07-09 DIAGNOSIS — Z89611 Acquired absence of right leg above knee: Secondary | ICD-10-CM | POA: Diagnosis not present

## 2015-07-09 DIAGNOSIS — I10 Essential (primary) hypertension: Secondary | ICD-10-CM | POA: Diagnosis not present

## 2015-07-09 DIAGNOSIS — E784 Other hyperlipidemia: Secondary | ICD-10-CM | POA: Diagnosis not present

## 2015-07-09 DIAGNOSIS — E1165 Type 2 diabetes mellitus with hyperglycemia: Secondary | ICD-10-CM | POA: Diagnosis not present

## 2015-07-22 DIAGNOSIS — K591 Functional diarrhea: Secondary | ICD-10-CM | POA: Diagnosis not present

## 2015-07-22 DIAGNOSIS — R159 Full incontinence of feces: Secondary | ICD-10-CM | POA: Diagnosis not present

## 2015-07-24 DIAGNOSIS — E784 Other hyperlipidemia: Secondary | ICD-10-CM | POA: Diagnosis not present

## 2015-07-24 DIAGNOSIS — I10 Essential (primary) hypertension: Secondary | ICD-10-CM | POA: Diagnosis not present

## 2015-07-24 DIAGNOSIS — M6281 Muscle weakness (generalized): Secondary | ICD-10-CM | POA: Diagnosis not present

## 2015-07-24 DIAGNOSIS — K219 Gastro-esophageal reflux disease without esophagitis: Secondary | ICD-10-CM | POA: Diagnosis not present

## 2015-07-27 DIAGNOSIS — D649 Anemia, unspecified: Secondary | ICD-10-CM | POA: Diagnosis not present

## 2015-07-27 DIAGNOSIS — N183 Chronic kidney disease, stage 3 (moderate): Secondary | ICD-10-CM | POA: Diagnosis not present

## 2015-07-27 DIAGNOSIS — E119 Type 2 diabetes mellitus without complications: Secondary | ICD-10-CM | POA: Diagnosis not present

## 2015-07-27 DIAGNOSIS — Z794 Long term (current) use of insulin: Secondary | ICD-10-CM | POA: Diagnosis not present

## 2015-07-27 DIAGNOSIS — E1122 Type 2 diabetes mellitus with diabetic chronic kidney disease: Secondary | ICD-10-CM | POA: Diagnosis not present

## 2015-07-27 DIAGNOSIS — N2581 Secondary hyperparathyroidism of renal origin: Secondary | ICD-10-CM | POA: Diagnosis not present

## 2015-07-27 DIAGNOSIS — I10 Essential (primary) hypertension: Secondary | ICD-10-CM | POA: Diagnosis not present

## 2015-07-27 DIAGNOSIS — Z89619 Acquired absence of unspecified leg above knee: Secondary | ICD-10-CM | POA: Diagnosis not present

## 2015-07-27 DIAGNOSIS — Z86718 Personal history of other venous thrombosis and embolism: Secondary | ICD-10-CM | POA: Diagnosis not present

## 2015-08-03 DIAGNOSIS — Z794 Long term (current) use of insulin: Secondary | ICD-10-CM | POA: Diagnosis not present

## 2015-08-03 DIAGNOSIS — I129 Hypertensive chronic kidney disease with stage 1 through stage 4 chronic kidney disease, or unspecified chronic kidney disease: Secondary | ICD-10-CM | POA: Diagnosis not present

## 2015-08-03 DIAGNOSIS — R1031 Right lower quadrant pain: Secondary | ICD-10-CM | POA: Diagnosis not present

## 2015-08-03 DIAGNOSIS — K921 Melena: Secondary | ICD-10-CM | POA: Diagnosis not present

## 2015-08-03 DIAGNOSIS — Z86718 Personal history of other venous thrombosis and embolism: Secondary | ICD-10-CM | POA: Diagnosis not present

## 2015-08-03 DIAGNOSIS — E1122 Type 2 diabetes mellitus with diabetic chronic kidney disease: Secondary | ICD-10-CM | POA: Diagnosis not present

## 2015-08-03 DIAGNOSIS — Z79899 Other long term (current) drug therapy: Secondary | ICD-10-CM | POA: Diagnosis not present

## 2015-08-03 DIAGNOSIS — D124 Benign neoplasm of descending colon: Secondary | ICD-10-CM | POA: Diagnosis not present

## 2015-08-03 DIAGNOSIS — N189 Chronic kidney disease, unspecified: Secondary | ICD-10-CM | POA: Diagnosis not present

## 2015-08-05 DIAGNOSIS — K219 Gastro-esophageal reflux disease without esophagitis: Secondary | ICD-10-CM | POA: Diagnosis not present

## 2015-08-05 DIAGNOSIS — I10 Essential (primary) hypertension: Secondary | ICD-10-CM | POA: Diagnosis not present

## 2015-08-05 DIAGNOSIS — Z89611 Acquired absence of right leg above knee: Secondary | ICD-10-CM | POA: Diagnosis not present

## 2015-08-05 DIAGNOSIS — E784 Other hyperlipidemia: Secondary | ICD-10-CM | POA: Diagnosis not present

## 2015-08-28 DIAGNOSIS — E1165 Type 2 diabetes mellitus with hyperglycemia: Secondary | ICD-10-CM | POA: Diagnosis not present

## 2015-08-28 DIAGNOSIS — K219 Gastro-esophageal reflux disease without esophagitis: Secondary | ICD-10-CM | POA: Diagnosis not present

## 2015-08-28 DIAGNOSIS — R262 Difficulty in walking, not elsewhere classified: Secondary | ICD-10-CM | POA: Diagnosis not present

## 2015-08-28 DIAGNOSIS — I10 Essential (primary) hypertension: Secondary | ICD-10-CM | POA: Diagnosis not present

## 2015-09-01 DIAGNOSIS — I70203 Unspecified atherosclerosis of native arteries of extremities, bilateral legs: Secondary | ICD-10-CM | POA: Diagnosis not present

## 2015-09-01 DIAGNOSIS — B351 Tinea unguium: Secondary | ICD-10-CM | POA: Diagnosis not present

## 2015-09-01 DIAGNOSIS — K5900 Constipation, unspecified: Secondary | ICD-10-CM | POA: Diagnosis not present

## 2015-09-01 DIAGNOSIS — E1142 Type 2 diabetes mellitus with diabetic polyneuropathy: Secondary | ICD-10-CM | POA: Diagnosis not present

## 2015-09-01 DIAGNOSIS — Z89611 Acquired absence of right leg above knee: Secondary | ICD-10-CM | POA: Diagnosis not present

## 2015-09-03 DIAGNOSIS — Z89611 Acquired absence of right leg above knee: Secondary | ICD-10-CM | POA: Diagnosis not present

## 2015-09-03 DIAGNOSIS — K5641 Fecal impaction: Secondary | ICD-10-CM | POA: Diagnosis not present

## 2015-09-07 DIAGNOSIS — K219 Gastro-esophageal reflux disease without esophagitis: Secondary | ICD-10-CM | POA: Diagnosis not present

## 2015-09-07 DIAGNOSIS — E784 Other hyperlipidemia: Secondary | ICD-10-CM | POA: Diagnosis not present

## 2015-09-07 DIAGNOSIS — I10 Essential (primary) hypertension: Secondary | ICD-10-CM | POA: Diagnosis not present

## 2015-09-07 DIAGNOSIS — F3289 Other specified depressive episodes: Secondary | ICD-10-CM | POA: Diagnosis not present

## 2015-09-22 DIAGNOSIS — E1165 Type 2 diabetes mellitus with hyperglycemia: Secondary | ICD-10-CM | POA: Diagnosis not present

## 2015-09-29 DIAGNOSIS — F411 Generalized anxiety disorder: Secondary | ICD-10-CM | POA: Diagnosis not present

## 2015-09-29 DIAGNOSIS — F320 Major depressive disorder, single episode, mild: Secondary | ICD-10-CM | POA: Diagnosis not present

## 2015-10-01 DIAGNOSIS — G4700 Insomnia, unspecified: Secondary | ICD-10-CM | POA: Diagnosis not present

## 2015-10-01 DIAGNOSIS — F320 Major depressive disorder, single episode, mild: Secondary | ICD-10-CM | POA: Diagnosis not present

## 2015-10-02 DIAGNOSIS — R262 Difficulty in walking, not elsewhere classified: Secondary | ICD-10-CM | POA: Diagnosis not present

## 2015-10-02 DIAGNOSIS — M6281 Muscle weakness (generalized): Secondary | ICD-10-CM | POA: Diagnosis not present

## 2015-10-02 DIAGNOSIS — I10 Essential (primary) hypertension: Secondary | ICD-10-CM | POA: Diagnosis not present

## 2015-10-02 DIAGNOSIS — E784 Other hyperlipidemia: Secondary | ICD-10-CM | POA: Diagnosis not present

## 2015-10-05 DIAGNOSIS — Z86718 Personal history of other venous thrombosis and embolism: Secondary | ICD-10-CM | POA: Diagnosis not present

## 2015-10-05 DIAGNOSIS — N183 Chronic kidney disease, stage 3 (moderate): Secondary | ICD-10-CM | POA: Diagnosis not present

## 2015-10-05 DIAGNOSIS — D649 Anemia, unspecified: Secondary | ICD-10-CM | POA: Diagnosis not present

## 2015-10-05 DIAGNOSIS — E119 Type 2 diabetes mellitus without complications: Secondary | ICD-10-CM | POA: Diagnosis not present

## 2015-10-05 DIAGNOSIS — Z794 Long term (current) use of insulin: Secondary | ICD-10-CM | POA: Diagnosis not present

## 2015-10-05 DIAGNOSIS — I10 Essential (primary) hypertension: Secondary | ICD-10-CM | POA: Diagnosis not present

## 2015-10-05 DIAGNOSIS — N2581 Secondary hyperparathyroidism of renal origin: Secondary | ICD-10-CM | POA: Diagnosis not present

## 2015-10-05 DIAGNOSIS — Z89619 Acquired absence of unspecified leg above knee: Secondary | ICD-10-CM | POA: Diagnosis not present

## 2015-10-06 DIAGNOSIS — E1165 Type 2 diabetes mellitus with hyperglycemia: Secondary | ICD-10-CM | POA: Diagnosis not present

## 2015-10-06 DIAGNOSIS — Z89611 Acquired absence of right leg above knee: Secondary | ICD-10-CM | POA: Diagnosis not present

## 2015-10-06 DIAGNOSIS — N183 Chronic kidney disease, stage 3 (moderate): Secondary | ICD-10-CM | POA: Diagnosis not present

## 2015-10-06 DIAGNOSIS — I10 Essential (primary) hypertension: Secondary | ICD-10-CM | POA: Diagnosis not present

## 2015-10-13 DIAGNOSIS — F320 Major depressive disorder, single episode, mild: Secondary | ICD-10-CM | POA: Diagnosis not present

## 2015-10-13 DIAGNOSIS — F411 Generalized anxiety disorder: Secondary | ICD-10-CM | POA: Diagnosis not present

## 2015-10-20 DIAGNOSIS — F320 Major depressive disorder, single episode, mild: Secondary | ICD-10-CM | POA: Diagnosis not present

## 2015-10-20 DIAGNOSIS — F411 Generalized anxiety disorder: Secondary | ICD-10-CM | POA: Diagnosis not present

## 2015-10-26 DIAGNOSIS — E291 Testicular hypofunction: Secondary | ICD-10-CM | POA: Diagnosis not present

## 2015-10-28 DIAGNOSIS — F411 Generalized anxiety disorder: Secondary | ICD-10-CM | POA: Diagnosis not present

## 2015-10-28 DIAGNOSIS — F320 Major depressive disorder, single episode, mild: Secondary | ICD-10-CM | POA: Diagnosis not present

## 2015-10-30 DIAGNOSIS — K219 Gastro-esophageal reflux disease without esophagitis: Secondary | ICD-10-CM | POA: Diagnosis not present

## 2015-10-30 DIAGNOSIS — E784 Other hyperlipidemia: Secondary | ICD-10-CM | POA: Diagnosis not present

## 2015-10-30 DIAGNOSIS — M6281 Muscle weakness (generalized): Secondary | ICD-10-CM | POA: Diagnosis not present

## 2015-10-30 DIAGNOSIS — I10 Essential (primary) hypertension: Secondary | ICD-10-CM | POA: Diagnosis not present

## 2015-11-03 DIAGNOSIS — D508 Other iron deficiency anemias: Secondary | ICD-10-CM | POA: Diagnosis not present

## 2015-11-03 DIAGNOSIS — F320 Major depressive disorder, single episode, mild: Secondary | ICD-10-CM | POA: Diagnosis not present

## 2015-11-03 DIAGNOSIS — F411 Generalized anxiety disorder: Secondary | ICD-10-CM | POA: Diagnosis not present

## 2015-11-03 DIAGNOSIS — E876 Hypokalemia: Secondary | ICD-10-CM | POA: Diagnosis not present

## 2015-11-03 DIAGNOSIS — I10 Essential (primary) hypertension: Secondary | ICD-10-CM | POA: Diagnosis not present

## 2015-11-03 DIAGNOSIS — Z89611 Acquired absence of right leg above knee: Secondary | ICD-10-CM | POA: Diagnosis not present

## 2015-11-11 DIAGNOSIS — F411 Generalized anxiety disorder: Secondary | ICD-10-CM | POA: Diagnosis not present

## 2015-11-11 DIAGNOSIS — F320 Major depressive disorder, single episode, mild: Secondary | ICD-10-CM | POA: Diagnosis not present

## 2015-11-20 DIAGNOSIS — S88019A Complete traumatic amputation at knee level, unspecified lower leg, initial encounter: Secondary | ICD-10-CM | POA: Diagnosis not present

## 2015-11-20 DIAGNOSIS — N189 Chronic kidney disease, unspecified: Secondary | ICD-10-CM | POA: Diagnosis not present

## 2015-11-20 DIAGNOSIS — E119 Type 2 diabetes mellitus without complications: Secondary | ICD-10-CM | POA: Diagnosis not present

## 2015-11-20 DIAGNOSIS — I10 Essential (primary) hypertension: Secondary | ICD-10-CM | POA: Diagnosis not present

## 2015-11-25 DIAGNOSIS — F320 Major depressive disorder, single episode, mild: Secondary | ICD-10-CM | POA: Diagnosis not present

## 2015-11-25 DIAGNOSIS — F411 Generalized anxiety disorder: Secondary | ICD-10-CM | POA: Diagnosis not present

## 2015-11-29 DIAGNOSIS — D631 Anemia in chronic kidney disease: Secondary | ICD-10-CM | POA: Diagnosis not present

## 2015-11-29 DIAGNOSIS — Z86718 Personal history of other venous thrombosis and embolism: Secondary | ICD-10-CM | POA: Diagnosis not present

## 2015-11-29 DIAGNOSIS — E119 Type 2 diabetes mellitus without complications: Secondary | ICD-10-CM | POA: Diagnosis not present

## 2015-11-29 DIAGNOSIS — I10 Essential (primary) hypertension: Secondary | ICD-10-CM | POA: Diagnosis not present

## 2015-11-29 DIAGNOSIS — E1122 Type 2 diabetes mellitus with diabetic chronic kidney disease: Secondary | ICD-10-CM | POA: Diagnosis not present

## 2015-11-29 DIAGNOSIS — Z89619 Acquired absence of unspecified leg above knee: Secondary | ICD-10-CM | POA: Diagnosis not present

## 2015-11-29 DIAGNOSIS — Z794 Long term (current) use of insulin: Secondary | ICD-10-CM | POA: Diagnosis not present

## 2015-11-29 DIAGNOSIS — N183 Chronic kidney disease, stage 3 (moderate): Secondary | ICD-10-CM | POA: Diagnosis not present

## 2015-11-29 DIAGNOSIS — N2581 Secondary hyperparathyroidism of renal origin: Secondary | ICD-10-CM | POA: Diagnosis not present

## 2015-12-01 DIAGNOSIS — E876 Hypokalemia: Secondary | ICD-10-CM | POA: Diagnosis not present

## 2015-12-01 DIAGNOSIS — F320 Major depressive disorder, single episode, mild: Secondary | ICD-10-CM | POA: Diagnosis not present

## 2015-12-01 DIAGNOSIS — R891 Abnormal level of hormones in specimens from other organs, systems and tissues: Secondary | ICD-10-CM | POA: Diagnosis not present

## 2015-12-01 DIAGNOSIS — F411 Generalized anxiety disorder: Secondary | ICD-10-CM | POA: Diagnosis not present

## 2015-12-01 DIAGNOSIS — R262 Difficulty in walking, not elsewhere classified: Secondary | ICD-10-CM | POA: Diagnosis not present

## 2015-12-01 DIAGNOSIS — D508 Other iron deficiency anemias: Secondary | ICD-10-CM | POA: Diagnosis not present

## 2015-12-08 DIAGNOSIS — E299 Testicular dysfunction, unspecified: Secondary | ICD-10-CM | POA: Diagnosis not present

## 2015-12-08 DIAGNOSIS — N390 Urinary tract infection, site not specified: Secondary | ICD-10-CM | POA: Diagnosis not present

## 2015-12-08 DIAGNOSIS — E291 Testicular hypofunction: Secondary | ICD-10-CM | POA: Diagnosis not present

## 2015-12-08 DIAGNOSIS — R319 Hematuria, unspecified: Secondary | ICD-10-CM | POA: Diagnosis not present

## 2015-12-08 DIAGNOSIS — M6281 Muscle weakness (generalized): Secondary | ICD-10-CM | POA: Diagnosis not present

## 2015-12-09 DIAGNOSIS — F320 Major depressive disorder, single episode, mild: Secondary | ICD-10-CM | POA: Diagnosis not present

## 2015-12-09 DIAGNOSIS — F411 Generalized anxiety disorder: Secondary | ICD-10-CM | POA: Diagnosis not present

## 2015-12-11 DIAGNOSIS — N189 Chronic kidney disease, unspecified: Secondary | ICD-10-CM | POA: Diagnosis not present

## 2015-12-11 DIAGNOSIS — I10 Essential (primary) hypertension: Secondary | ICD-10-CM | POA: Diagnosis not present

## 2015-12-11 DIAGNOSIS — E119 Type 2 diabetes mellitus without complications: Secondary | ICD-10-CM | POA: Diagnosis not present

## 2015-12-11 DIAGNOSIS — R112 Nausea with vomiting, unspecified: Secondary | ICD-10-CM | POA: Diagnosis not present

## 2015-12-13 DIAGNOSIS — E118 Type 2 diabetes mellitus with unspecified complications: Secondary | ICD-10-CM | POA: Diagnosis not present

## 2015-12-13 DIAGNOSIS — I10 Essential (primary) hypertension: Secondary | ICD-10-CM | POA: Diagnosis not present

## 2015-12-13 DIAGNOSIS — Z794 Long term (current) use of insulin: Secondary | ICD-10-CM | POA: Diagnosis not present

## 2015-12-13 DIAGNOSIS — E785 Hyperlipidemia, unspecified: Secondary | ICD-10-CM | POA: Diagnosis not present

## 2015-12-16 DIAGNOSIS — F411 Generalized anxiety disorder: Secondary | ICD-10-CM | POA: Diagnosis not present

## 2015-12-16 DIAGNOSIS — F320 Major depressive disorder, single episode, mild: Secondary | ICD-10-CM | POA: Diagnosis not present

## 2015-12-23 DIAGNOSIS — F411 Generalized anxiety disorder: Secondary | ICD-10-CM | POA: Diagnosis not present

## 2015-12-23 DIAGNOSIS — F320 Major depressive disorder, single episode, mild: Secondary | ICD-10-CM | POA: Diagnosis not present

## 2015-12-29 DIAGNOSIS — R891 Abnormal level of hormones in specimens from other organs, systems and tissues: Secondary | ICD-10-CM | POA: Diagnosis not present

## 2015-12-29 DIAGNOSIS — E876 Hypokalemia: Secondary | ICD-10-CM | POA: Diagnosis not present

## 2015-12-29 DIAGNOSIS — N183 Chronic kidney disease, stage 3 (moderate): Secondary | ICD-10-CM | POA: Diagnosis not present

## 2015-12-29 DIAGNOSIS — E1165 Type 2 diabetes mellitus with hyperglycemia: Secondary | ICD-10-CM | POA: Diagnosis not present

## 2015-12-30 DIAGNOSIS — F320 Major depressive disorder, single episode, mild: Secondary | ICD-10-CM | POA: Diagnosis not present

## 2015-12-30 DIAGNOSIS — F411 Generalized anxiety disorder: Secondary | ICD-10-CM | POA: Diagnosis not present

## 2016-01-02 DIAGNOSIS — I10 Essential (primary) hypertension: Secondary | ICD-10-CM | POA: Diagnosis not present

## 2016-01-02 DIAGNOSIS — R112 Nausea with vomiting, unspecified: Secondary | ICD-10-CM | POA: Diagnosis not present

## 2016-01-02 DIAGNOSIS — E119 Type 2 diabetes mellitus without complications: Secondary | ICD-10-CM | POA: Diagnosis not present

## 2016-01-02 DIAGNOSIS — N189 Chronic kidney disease, unspecified: Secondary | ICD-10-CM | POA: Diagnosis not present

## 2016-01-13 DIAGNOSIS — F411 Generalized anxiety disorder: Secondary | ICD-10-CM | POA: Diagnosis not present

## 2016-01-13 DIAGNOSIS — F320 Major depressive disorder, single episode, mild: Secondary | ICD-10-CM | POA: Diagnosis not present

## 2016-01-20 DIAGNOSIS — F320 Major depressive disorder, single episode, mild: Secondary | ICD-10-CM | POA: Diagnosis not present

## 2016-01-20 DIAGNOSIS — F411 Generalized anxiety disorder: Secondary | ICD-10-CM | POA: Diagnosis not present

## 2016-01-24 DIAGNOSIS — N2581 Secondary hyperparathyroidism of renal origin: Secondary | ICD-10-CM | POA: Diagnosis not present

## 2016-01-24 DIAGNOSIS — E1122 Type 2 diabetes mellitus with diabetic chronic kidney disease: Secondary | ICD-10-CM | POA: Diagnosis not present

## 2016-01-24 DIAGNOSIS — Z89619 Acquired absence of unspecified leg above knee: Secondary | ICD-10-CM | POA: Diagnosis not present

## 2016-01-24 DIAGNOSIS — Z794 Long term (current) use of insulin: Secondary | ICD-10-CM | POA: Diagnosis not present

## 2016-01-24 DIAGNOSIS — Z6841 Body Mass Index (BMI) 40.0 and over, adult: Secondary | ICD-10-CM | POA: Diagnosis not present

## 2016-01-24 DIAGNOSIS — N183 Chronic kidney disease, stage 3 (moderate): Secondary | ICD-10-CM | POA: Diagnosis not present

## 2016-01-24 DIAGNOSIS — E119 Type 2 diabetes mellitus without complications: Secondary | ICD-10-CM | POA: Diagnosis not present

## 2016-01-24 DIAGNOSIS — Z86718 Personal history of other venous thrombosis and embolism: Secondary | ICD-10-CM | POA: Diagnosis not present

## 2016-01-24 DIAGNOSIS — I10 Essential (primary) hypertension: Secondary | ICD-10-CM | POA: Diagnosis not present

## 2016-01-24 DIAGNOSIS — D631 Anemia in chronic kidney disease: Secondary | ICD-10-CM | POA: Diagnosis not present

## 2016-01-26 DIAGNOSIS — F320 Major depressive disorder, single episode, mild: Secondary | ICD-10-CM | POA: Diagnosis not present

## 2016-01-26 DIAGNOSIS — D508 Other iron deficiency anemias: Secondary | ICD-10-CM | POA: Diagnosis not present

## 2016-01-26 DIAGNOSIS — F411 Generalized anxiety disorder: Secondary | ICD-10-CM | POA: Diagnosis not present

## 2016-01-26 DIAGNOSIS — I10 Essential (primary) hypertension: Secondary | ICD-10-CM | POA: Diagnosis not present

## 2016-01-26 DIAGNOSIS — E785 Hyperlipidemia, unspecified: Secondary | ICD-10-CM | POA: Diagnosis not present

## 2016-01-26 DIAGNOSIS — R262 Difficulty in walking, not elsewhere classified: Secondary | ICD-10-CM | POA: Diagnosis not present

## 2016-02-03 DIAGNOSIS — F411 Generalized anxiety disorder: Secondary | ICD-10-CM | POA: Diagnosis not present

## 2016-02-03 DIAGNOSIS — R7989 Other specified abnormal findings of blood chemistry: Secondary | ICD-10-CM | POA: Diagnosis not present

## 2016-02-03 DIAGNOSIS — F320 Major depressive disorder, single episode, mild: Secondary | ICD-10-CM | POA: Diagnosis not present

## 2016-02-09 DIAGNOSIS — E291 Testicular hypofunction: Secondary | ICD-10-CM | POA: Diagnosis not present

## 2016-02-11 DIAGNOSIS — B338 Other specified viral diseases: Secondary | ICD-10-CM | POA: Diagnosis not present

## 2016-02-12 DIAGNOSIS — K5900 Constipation, unspecified: Secondary | ICD-10-CM | POA: Diagnosis not present

## 2016-02-12 DIAGNOSIS — R1030 Lower abdominal pain, unspecified: Secondary | ICD-10-CM | POA: Diagnosis not present

## 2016-02-12 DIAGNOSIS — R1084 Generalized abdominal pain: Secondary | ICD-10-CM | POA: Diagnosis not present

## 2016-02-12 DIAGNOSIS — R1031 Right lower quadrant pain: Secondary | ICD-10-CM | POA: Diagnosis not present

## 2016-02-12 DIAGNOSIS — J101 Influenza due to other identified influenza virus with other respiratory manifestations: Secondary | ICD-10-CM | POA: Diagnosis not present

## 2016-02-12 DIAGNOSIS — R1111 Vomiting without nausea: Secondary | ICD-10-CM | POA: Diagnosis not present

## 2016-02-12 DIAGNOSIS — J09X2 Influenza due to identified novel influenza A virus with other respiratory manifestations: Secondary | ICD-10-CM | POA: Diagnosis not present

## 2016-02-14 DIAGNOSIS — R891 Abnormal level of hormones in specimens from other organs, systems and tissues: Secondary | ICD-10-CM | POA: Diagnosis not present

## 2016-02-14 DIAGNOSIS — J111 Influenza due to unidentified influenza virus with other respiratory manifestations: Secondary | ICD-10-CM | POA: Diagnosis not present

## 2016-02-15 DIAGNOSIS — R7989 Other specified abnormal findings of blood chemistry: Secondary | ICD-10-CM | POA: Diagnosis not present

## 2016-02-17 DIAGNOSIS — F320 Major depressive disorder, single episode, mild: Secondary | ICD-10-CM | POA: Diagnosis not present

## 2016-02-17 DIAGNOSIS — F411 Generalized anxiety disorder: Secondary | ICD-10-CM | POA: Diagnosis not present

## 2016-02-22 DIAGNOSIS — E291 Testicular hypofunction: Secondary | ICD-10-CM | POA: Diagnosis not present

## 2016-02-22 DIAGNOSIS — D510 Vitamin B12 deficiency anemia due to intrinsic factor deficiency: Secondary | ICD-10-CM | POA: Diagnosis not present

## 2016-02-22 DIAGNOSIS — B338 Other specified viral diseases: Secondary | ICD-10-CM | POA: Diagnosis not present

## 2016-02-22 DIAGNOSIS — E299 Testicular dysfunction, unspecified: Secondary | ICD-10-CM | POA: Diagnosis not present

## 2016-02-23 DIAGNOSIS — R262 Difficulty in walking, not elsewhere classified: Secondary | ICD-10-CM | POA: Diagnosis not present

## 2016-02-23 DIAGNOSIS — I10 Essential (primary) hypertension: Secondary | ICD-10-CM | POA: Diagnosis not present

## 2016-02-23 DIAGNOSIS — E785 Hyperlipidemia, unspecified: Secondary | ICD-10-CM | POA: Diagnosis not present

## 2016-02-23 DIAGNOSIS — D508 Other iron deficiency anemias: Secondary | ICD-10-CM | POA: Diagnosis not present

## 2016-02-24 DIAGNOSIS — F411 Generalized anxiety disorder: Secondary | ICD-10-CM | POA: Diagnosis not present

## 2016-02-24 DIAGNOSIS — F320 Major depressive disorder, single episode, mild: Secondary | ICD-10-CM | POA: Diagnosis not present

## 2016-03-02 DIAGNOSIS — D510 Vitamin B12 deficiency anemia due to intrinsic factor deficiency: Secondary | ICD-10-CM | POA: Diagnosis not present

## 2016-03-02 DIAGNOSIS — F411 Generalized anxiety disorder: Secondary | ICD-10-CM | POA: Diagnosis not present

## 2016-03-02 DIAGNOSIS — E291 Testicular hypofunction: Secondary | ICD-10-CM | POA: Diagnosis not present

## 2016-03-02 DIAGNOSIS — E299 Testicular dysfunction, unspecified: Secondary | ICD-10-CM | POA: Diagnosis not present

## 2016-03-02 DIAGNOSIS — B338 Other specified viral diseases: Secondary | ICD-10-CM | POA: Diagnosis not present

## 2016-03-02 DIAGNOSIS — F320 Major depressive disorder, single episode, mild: Secondary | ICD-10-CM | POA: Diagnosis not present

## 2016-03-09 DIAGNOSIS — F320 Major depressive disorder, single episode, mild: Secondary | ICD-10-CM | POA: Diagnosis not present

## 2016-03-09 DIAGNOSIS — F411 Generalized anxiety disorder: Secondary | ICD-10-CM | POA: Diagnosis not present

## 2016-03-20 DIAGNOSIS — R7989 Other specified abnormal findings of blood chemistry: Secondary | ICD-10-CM | POA: Diagnosis not present

## 2016-03-20 DIAGNOSIS — Z112 Encounter for screening for other bacterial diseases: Secondary | ICD-10-CM | POA: Diagnosis not present

## 2016-03-22 DIAGNOSIS — E785 Hyperlipidemia, unspecified: Secondary | ICD-10-CM | POA: Diagnosis not present

## 2016-03-22 DIAGNOSIS — D508 Other iron deficiency anemias: Secondary | ICD-10-CM | POA: Diagnosis not present

## 2016-03-22 DIAGNOSIS — I10 Essential (primary) hypertension: Secondary | ICD-10-CM | POA: Diagnosis not present

## 2016-03-22 DIAGNOSIS — B20 Human immunodeficiency virus [HIV] disease: Secondary | ICD-10-CM | POA: Diagnosis not present

## 2016-03-22 DIAGNOSIS — R262 Difficulty in walking, not elsewhere classified: Secondary | ICD-10-CM | POA: Diagnosis not present

## 2016-03-23 DIAGNOSIS — F320 Major depressive disorder, single episode, mild: Secondary | ICD-10-CM | POA: Diagnosis not present

## 2016-03-23 DIAGNOSIS — F411 Generalized anxiety disorder: Secondary | ICD-10-CM | POA: Diagnosis not present

## 2016-03-27 DIAGNOSIS — Z113 Encounter for screening for infections with a predominantly sexual mode of transmission: Secondary | ICD-10-CM | POA: Diagnosis not present

## 2016-03-28 DIAGNOSIS — E1122 Type 2 diabetes mellitus with diabetic chronic kidney disease: Secondary | ICD-10-CM | POA: Diagnosis not present

## 2016-03-28 DIAGNOSIS — I10 Essential (primary) hypertension: Secondary | ICD-10-CM | POA: Diagnosis not present

## 2016-03-28 DIAGNOSIS — D631 Anemia in chronic kidney disease: Secondary | ICD-10-CM | POA: Diagnosis not present

## 2016-03-28 DIAGNOSIS — Z794 Long term (current) use of insulin: Secondary | ICD-10-CM | POA: Diagnosis not present

## 2016-03-28 DIAGNOSIS — Z86718 Personal history of other venous thrombosis and embolism: Secondary | ICD-10-CM | POA: Diagnosis not present

## 2016-03-28 DIAGNOSIS — N183 Chronic kidney disease, stage 3 (moderate): Secondary | ICD-10-CM | POA: Diagnosis not present

## 2016-03-28 DIAGNOSIS — E119 Type 2 diabetes mellitus without complications: Secondary | ICD-10-CM | POA: Diagnosis not present

## 2016-03-28 DIAGNOSIS — N2581 Secondary hyperparathyroidism of renal origin: Secondary | ICD-10-CM | POA: Diagnosis not present

## 2016-03-28 DIAGNOSIS — Z6841 Body Mass Index (BMI) 40.0 and over, adult: Secondary | ICD-10-CM | POA: Diagnosis not present

## 2016-03-28 DIAGNOSIS — Z89619 Acquired absence of unspecified leg above knee: Secondary | ICD-10-CM | POA: Diagnosis not present

## 2016-03-30 DIAGNOSIS — F411 Generalized anxiety disorder: Secondary | ICD-10-CM | POA: Diagnosis not present

## 2016-03-30 DIAGNOSIS — F320 Major depressive disorder, single episode, mild: Secondary | ICD-10-CM | POA: Diagnosis not present

## 2016-04-06 DIAGNOSIS — K5900 Constipation, unspecified: Secondary | ICD-10-CM | POA: Diagnosis not present

## 2016-04-06 DIAGNOSIS — R935 Abnormal findings on diagnostic imaging of other abdominal regions, including retroperitoneum: Secondary | ICD-10-CM | POA: Diagnosis not present

## 2016-04-06 DIAGNOSIS — K8020 Calculus of gallbladder without cholecystitis without obstruction: Secondary | ICD-10-CM | POA: Diagnosis not present

## 2016-04-06 DIAGNOSIS — N184 Chronic kidney disease, stage 4 (severe): Secondary | ICD-10-CM | POA: Diagnosis not present

## 2016-04-24 DIAGNOSIS — E1165 Type 2 diabetes mellitus with hyperglycemia: Secondary | ICD-10-CM | POA: Diagnosis not present

## 2016-04-24 DIAGNOSIS — L259 Unspecified contact dermatitis, unspecified cause: Secondary | ICD-10-CM | POA: Diagnosis not present

## 2016-05-02 DIAGNOSIS — E1165 Type 2 diabetes mellitus with hyperglycemia: Secondary | ICD-10-CM | POA: Diagnosis not present

## 2016-05-02 DIAGNOSIS — Z794 Long term (current) use of insulin: Secondary | ICD-10-CM | POA: Diagnosis not present

## 2016-05-02 DIAGNOSIS — E1140 Type 2 diabetes mellitus with diabetic neuropathy, unspecified: Secondary | ICD-10-CM | POA: Diagnosis not present

## 2016-05-04 DIAGNOSIS — R339 Retention of urine, unspecified: Secondary | ICD-10-CM | POA: Insufficient documentation

## 2016-05-04 DIAGNOSIS — Z89611 Acquired absence of right leg above knee: Secondary | ICD-10-CM | POA: Insufficient documentation

## 2016-05-04 DIAGNOSIS — F419 Anxiety disorder, unspecified: Secondary | ICD-10-CM | POA: Insufficient documentation

## 2016-05-04 DIAGNOSIS — F32A Depression, unspecified: Secondary | ICD-10-CM | POA: Insufficient documentation

## 2016-05-04 DIAGNOSIS — E291 Testicular hypofunction: Secondary | ICD-10-CM | POA: Insufficient documentation

## 2016-05-04 DIAGNOSIS — D638 Anemia in other chronic diseases classified elsewhere: Secondary | ICD-10-CM | POA: Insufficient documentation

## 2016-05-14 DIAGNOSIS — M1A39X0 Chronic gout due to renal impairment, multiple sites, without tophus (tophi): Secondary | ICD-10-CM | POA: Diagnosis not present

## 2016-05-14 DIAGNOSIS — I10 Essential (primary) hypertension: Secondary | ICD-10-CM | POA: Diagnosis not present

## 2016-05-14 DIAGNOSIS — E785 Hyperlipidemia, unspecified: Secondary | ICD-10-CM | POA: Diagnosis not present

## 2016-05-14 DIAGNOSIS — E1165 Type 2 diabetes mellitus with hyperglycemia: Secondary | ICD-10-CM | POA: Diagnosis not present

## 2016-05-15 DIAGNOSIS — B369 Superficial mycosis, unspecified: Secondary | ICD-10-CM | POA: Diagnosis not present

## 2016-05-15 DIAGNOSIS — R300 Dysuria: Secondary | ICD-10-CM | POA: Diagnosis not present

## 2016-05-15 DIAGNOSIS — Z113 Encounter for screening for infections with a predominantly sexual mode of transmission: Secondary | ICD-10-CM | POA: Diagnosis not present

## 2016-05-16 DIAGNOSIS — R7989 Other specified abnormal findings of blood chemistry: Secondary | ICD-10-CM | POA: Diagnosis not present

## 2016-05-16 DIAGNOSIS — J111 Influenza due to unidentified influenza virus with other respiratory manifestations: Secondary | ICD-10-CM | POA: Diagnosis not present

## 2016-05-16 DIAGNOSIS — M6281 Muscle weakness (generalized): Secondary | ICD-10-CM | POA: Diagnosis not present

## 2016-05-16 DIAGNOSIS — R197 Diarrhea, unspecified: Secondary | ICD-10-CM | POA: Diagnosis not present

## 2016-05-16 DIAGNOSIS — R319 Hematuria, unspecified: Secondary | ICD-10-CM | POA: Diagnosis not present

## 2016-05-16 DIAGNOSIS — N390 Urinary tract infection, site not specified: Secondary | ICD-10-CM | POA: Diagnosis not present

## 2016-05-22 DIAGNOSIS — E119 Type 2 diabetes mellitus without complications: Secondary | ICD-10-CM | POA: Diagnosis not present

## 2016-05-22 DIAGNOSIS — Z89619 Acquired absence of unspecified leg above knee: Secondary | ICD-10-CM | POA: Diagnosis not present

## 2016-05-22 DIAGNOSIS — Z794 Long term (current) use of insulin: Secondary | ICD-10-CM | POA: Diagnosis not present

## 2016-05-22 DIAGNOSIS — E1122 Type 2 diabetes mellitus with diabetic chronic kidney disease: Secondary | ICD-10-CM | POA: Diagnosis not present

## 2016-05-22 DIAGNOSIS — N2581 Secondary hyperparathyroidism of renal origin: Secondary | ICD-10-CM | POA: Diagnosis not present

## 2016-05-22 DIAGNOSIS — Z86718 Personal history of other venous thrombosis and embolism: Secondary | ICD-10-CM | POA: Diagnosis not present

## 2016-05-22 DIAGNOSIS — N183 Chronic kidney disease, stage 3 (moderate): Secondary | ICD-10-CM | POA: Diagnosis not present

## 2016-05-22 DIAGNOSIS — R300 Dysuria: Secondary | ICD-10-CM | POA: Diagnosis not present

## 2016-05-22 DIAGNOSIS — Z6841 Body Mass Index (BMI) 40.0 and over, adult: Secondary | ICD-10-CM | POA: Diagnosis not present

## 2016-05-22 DIAGNOSIS — I10 Essential (primary) hypertension: Secondary | ICD-10-CM | POA: Diagnosis not present

## 2016-05-22 DIAGNOSIS — D631 Anemia in chronic kidney disease: Secondary | ICD-10-CM | POA: Diagnosis not present

## 2016-06-06 DIAGNOSIS — I10 Essential (primary) hypertension: Secondary | ICD-10-CM | POA: Diagnosis not present

## 2016-06-06 DIAGNOSIS — E785 Hyperlipidemia, unspecified: Secondary | ICD-10-CM | POA: Diagnosis not present

## 2016-06-06 DIAGNOSIS — M6281 Muscle weakness (generalized): Secondary | ICD-10-CM | POA: Diagnosis not present

## 2016-06-06 DIAGNOSIS — F329 Major depressive disorder, single episode, unspecified: Secondary | ICD-10-CM | POA: Diagnosis not present

## 2016-06-08 ENCOUNTER — Encounter: Payer: Self-pay | Admitting: Internal Medicine

## 2016-06-08 ENCOUNTER — Ambulatory Visit (INDEPENDENT_AMBULATORY_CARE_PROVIDER_SITE_OTHER): Payer: Medicare Other | Admitting: Internal Medicine

## 2016-06-08 VITALS — BP 119/63 | HR 89 | Temp 98.3°F | Ht 74.0 in

## 2016-06-08 DIAGNOSIS — Z89611 Acquired absence of right leg above knee: Secondary | ICD-10-CM

## 2016-06-08 DIAGNOSIS — IMO0002 Reserved for concepts with insufficient information to code with codable children: Secondary | ICD-10-CM

## 2016-06-08 DIAGNOSIS — E1051 Type 1 diabetes mellitus with diabetic peripheral angiopathy without gangrene: Secondary | ICD-10-CM | POA: Diagnosis not present

## 2016-06-08 DIAGNOSIS — S78111A Complete traumatic amputation at level between right hip and knee, initial encounter: Secondary | ICD-10-CM

## 2016-06-08 DIAGNOSIS — Z86718 Personal history of other venous thrombosis and embolism: Secondary | ICD-10-CM

## 2016-06-08 DIAGNOSIS — Z86711 Personal history of pulmonary embolism: Secondary | ICD-10-CM

## 2016-06-08 DIAGNOSIS — Z8249 Family history of ischemic heart disease and other diseases of the circulatory system: Secondary | ICD-10-CM

## 2016-06-08 DIAGNOSIS — I129 Hypertensive chronic kidney disease with stage 1 through stage 4 chronic kidney disease, or unspecified chronic kidney disease: Secondary | ICD-10-CM | POA: Diagnosis not present

## 2016-06-08 DIAGNOSIS — Z833 Family history of diabetes mellitus: Secondary | ICD-10-CM | POA: Diagnosis not present

## 2016-06-08 DIAGNOSIS — I10 Essential (primary) hypertension: Secondary | ICD-10-CM

## 2016-06-08 DIAGNOSIS — N184 Chronic kidney disease, stage 4 (severe): Secondary | ICD-10-CM | POA: Diagnosis not present

## 2016-06-08 DIAGNOSIS — Z7901 Long term (current) use of anticoagulants: Secondary | ICD-10-CM

## 2016-06-08 DIAGNOSIS — E1022 Type 1 diabetes mellitus with diabetic chronic kidney disease: Secondary | ICD-10-CM | POA: Diagnosis not present

## 2016-06-08 DIAGNOSIS — S88111A Complete traumatic amputation at level between knee and ankle, right lower leg, initial encounter: Secondary | ICD-10-CM

## 2016-06-08 DIAGNOSIS — E1065 Type 1 diabetes mellitus with hyperglycemia: Secondary | ICD-10-CM

## 2016-06-08 IMAGING — US US EXTREM LOW*R* LIMITED
1 series · 14 of 14 positions shown · non-contrast
Comparison: None

CLINICAL DATA: 38-year-old male with induration posterior to the
left hip. Evaluate for subcutaneous abscess

EXAM:
ULTRASOUND LEFT LOWER EXTREMITY LIMITED
TECHNIQUE: Ultrasound examination of the lower extremity soft tissues was
performed in the area of clinical concern.

[Series 1: us extrem low*right* limited · 0.07mm/px · 14 of 14 slices shown]
[im 1/14]
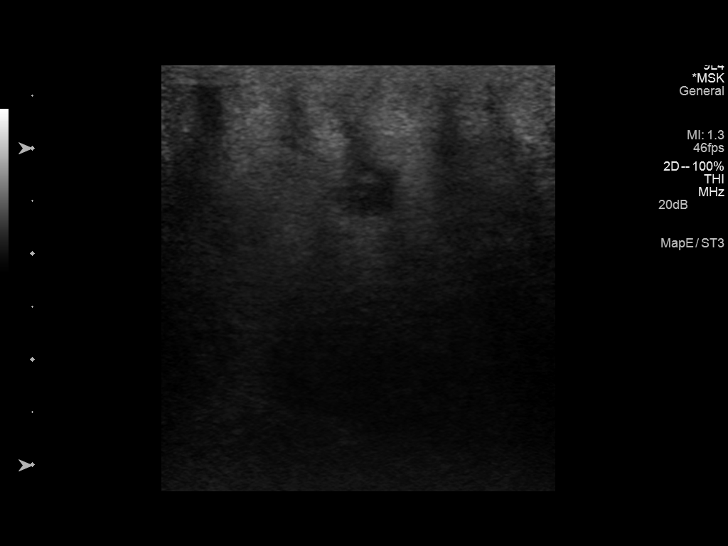
[im 2/14]
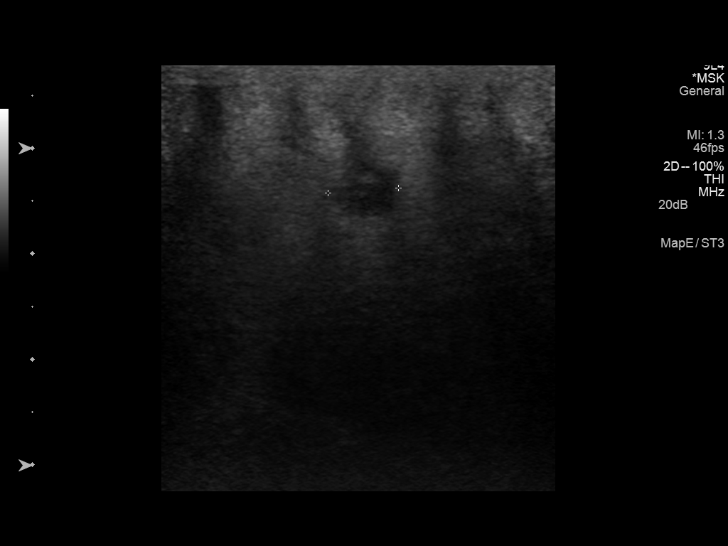
[im 3/14]
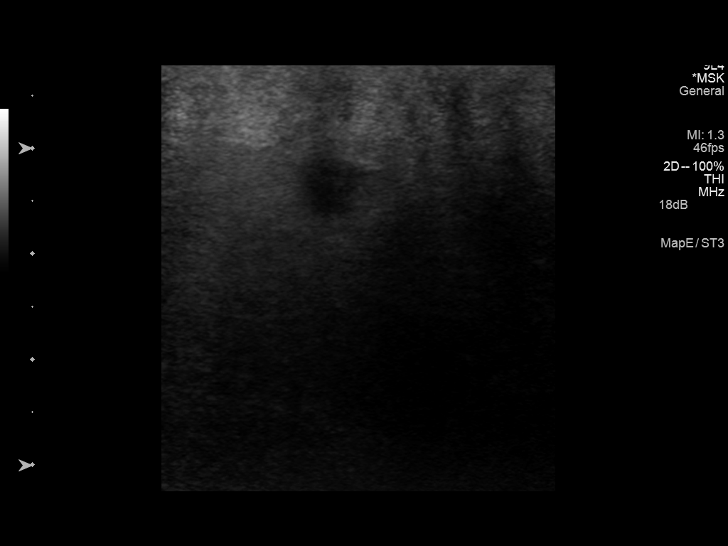
[im 4/14]
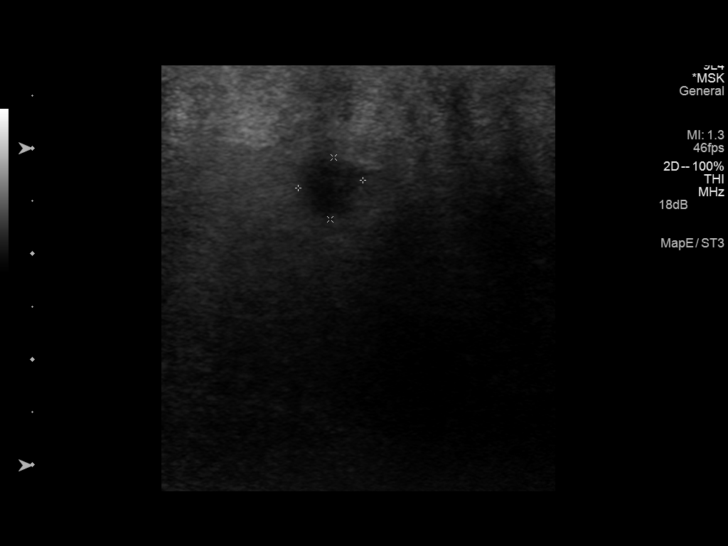
[im 5/14]
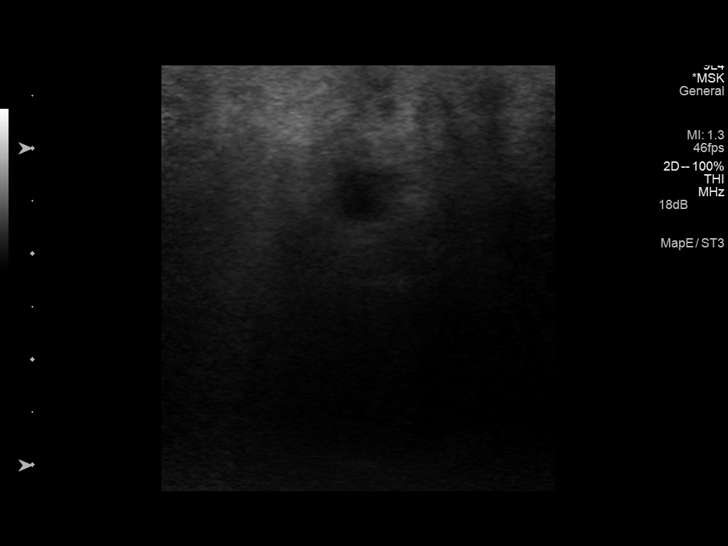
[im 6/14]
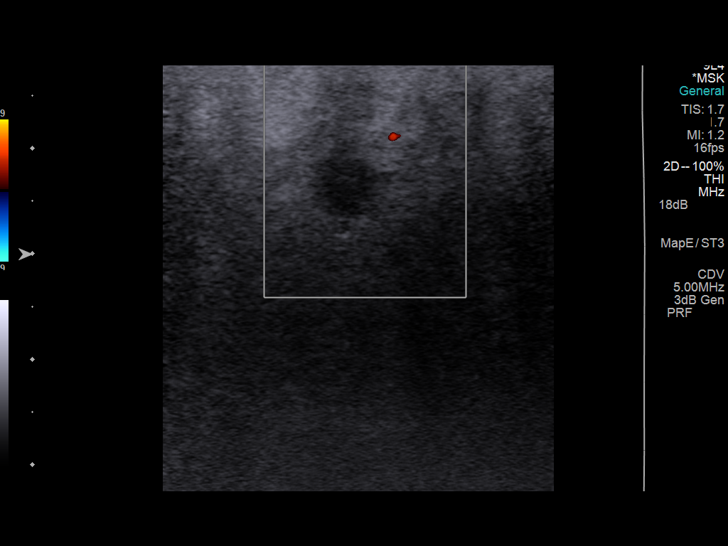
[im 7/14]
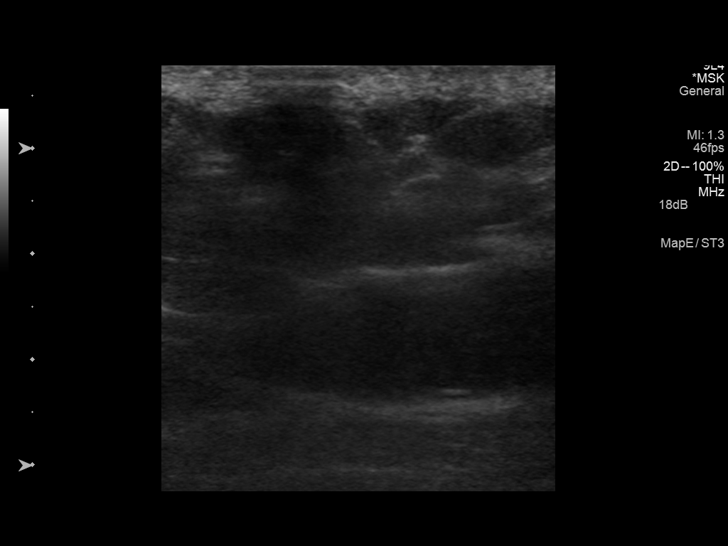
[im 8/14]
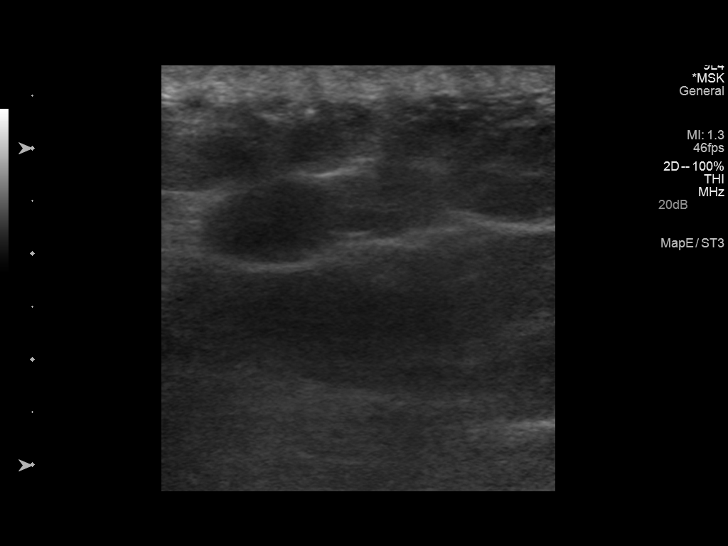
[im 9/14]
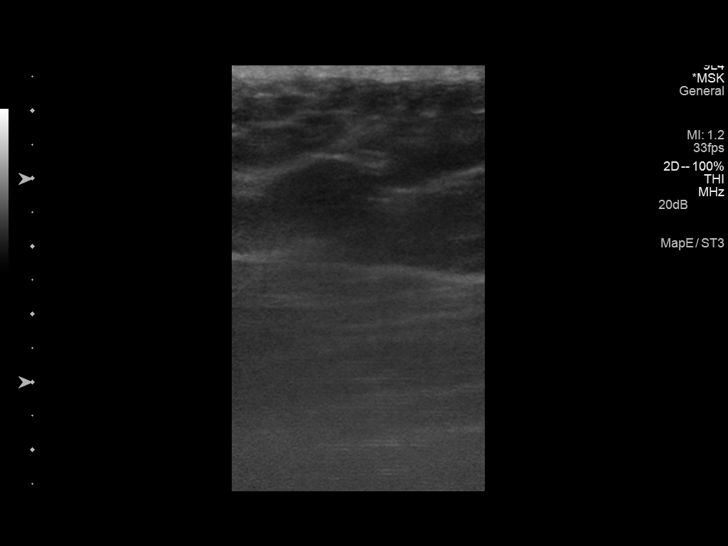
[im 10/14]
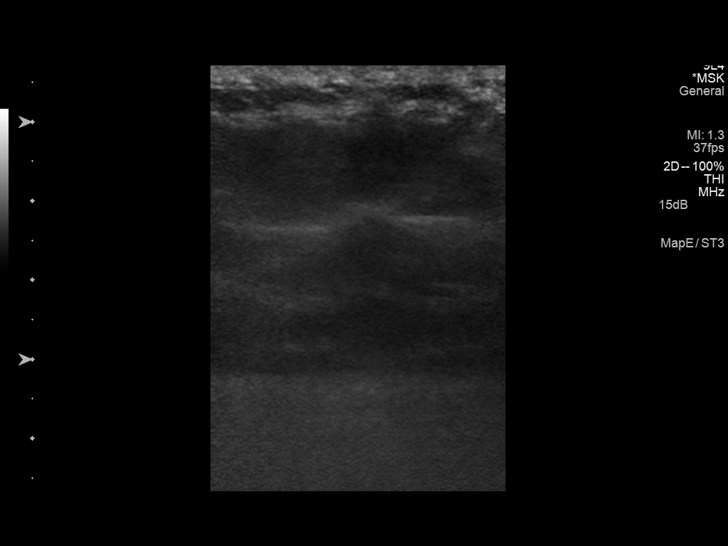
[im 11/14]
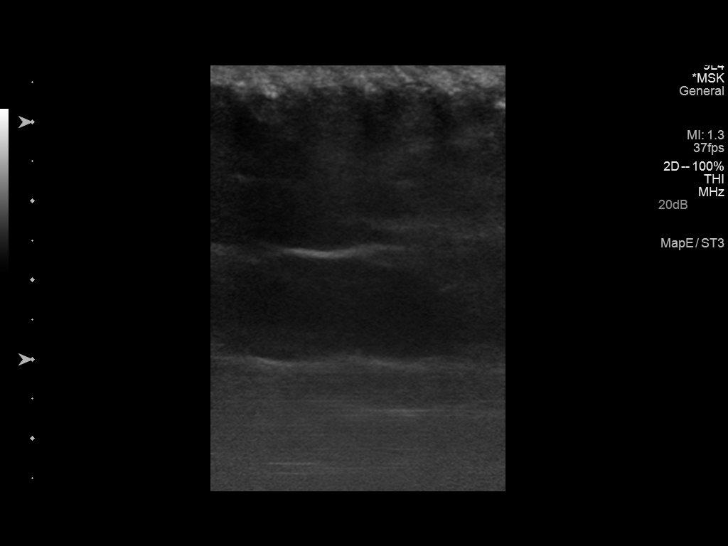
[im 12/14]
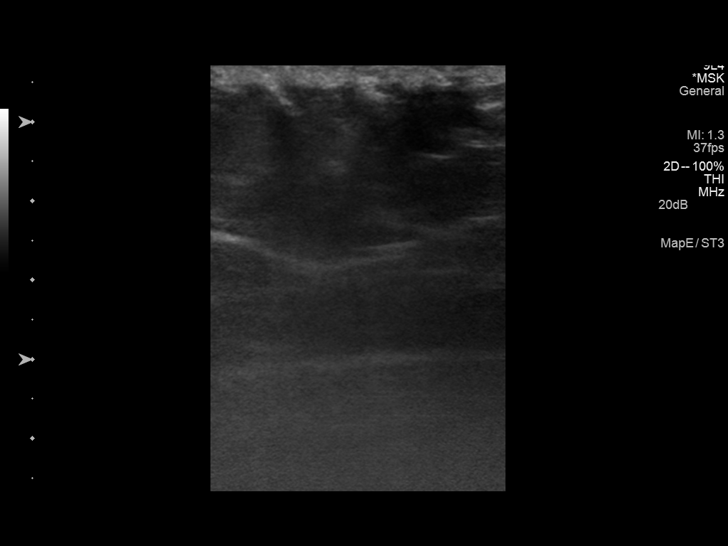
[im 13/14]
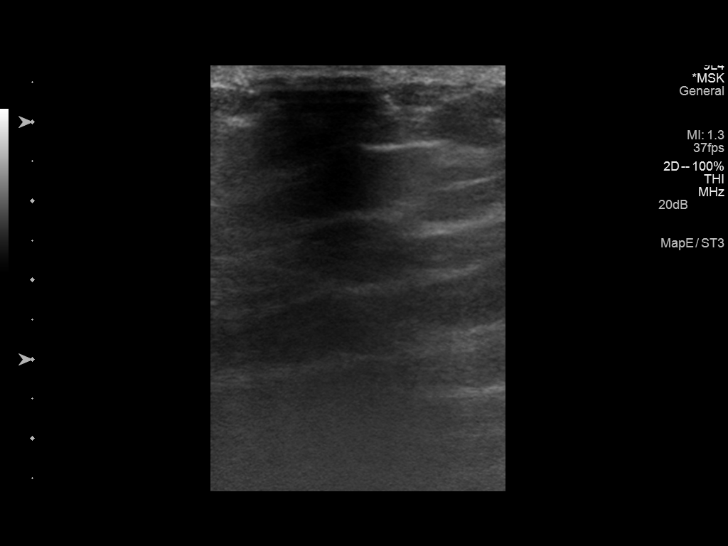
[im 14/14]
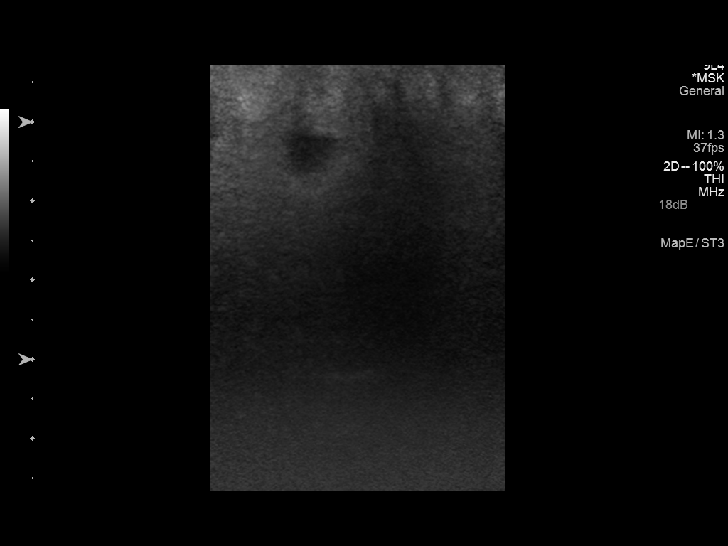

[14 of 14 positions shown; findings below may reference images not displayed]

FINDINGS: Focal sonographic interrogation of the region of clinical concern
demonstrates a small 7 mm focal anechoic fluid collection consistent
with abscess. Otherwise, the region contains a hypoechoic but
unremarkable appearing subcutaneous adipose tissue. Of note, the
region is exquisitely painful to touch during sonography.
IMPRESSION: Cellulitis with focal 7 mm fluid collection concerning for small
abscess in the superficial subcutaneous fat posterior to the left
hip.

## 2016-06-08 MED ORDER — FUROSEMIDE 40 MG PO TABS: 80.0000 mg | ORAL_TABLET | Freq: Two times a day (BID) | ORAL | 1 refills | Status: DC

## 2016-06-08 MED ORDER — LOVASTATIN 20 MG PO TABS
20.0000 mg | ORAL_TABLET | Freq: Every day | ORAL | 11 refills | Status: DC
Start: 2016-06-08 — End: 2017-09-04

## 2016-06-08 NOTE — Progress Notes (Signed)
   CC: BP and DM follow up   HPI:  [name] is a 41 y.o. male with past medical history outlined below here for follow up of his DM and BP and to re-establish care. For the details of today's visit, please refer to the assessment and plan.  Past Medical History:  Diagnosis Date  . Acute venous embolism and thrombosis of deep vessels of proximal lower extremity ([name]) 07/19/2011  . Chest pain, neg MI, normal coronaries by cath 02/18/2013  . CKD (chronic kidney disease) stage 3, GFR 30-59 ml/min 02/19/2013  . Diabetic ulcer of right foot ([name])   . DVT (deep venous thrombosis) ([name]) 09/2002   patient reports additional DVTs in '06 & '11 (unconfirmed)  . GERD (gastroesophageal reflux disease)   . Hyperlipidemia 02/19/2013  . Hypertension   . Nephrotic syndrome   . Obesity    BMI 44, weight 346 pounds 01/30/14  . Pulmonary embolism ([name]) 09/2002   treated with 6 months of warfarin  . Type I diabetes mellitus ([name]) dx'd 2001    Review of Systems:  Denies chest pain and SOB.    Family History: Mom and brother with DM, dad with HTN  Social History: Patient has been residing at [facility] since his right AKA surgery in 2016. Plans are for discharge this month. He is planning to get his own apartment here in [place]. He denies tobacco, alcohol, and illicit drug use.  Physical Exam:  Vitals:   06/08/16 1358  BP: 119/63  Pulse: 89  Temp: 98.3 F (36.8 C)  TempSrc: Oral  SpO2: 100%  Height: 6\' 2"  (1.88 m)    Constitutional: Morbidly obese gentleman in NAD  HEENT: Atraumatic, normocephalic. PERRL, anicteric sclera.  Neck: Supple, trachea midline.  Cardiovascular: RRR, no murmurs, rubs, or gallops.  Pulmonary/Chest: CTAB, no wheezes, rales, or rhonchi.   Abdominal: Soft, non tender, Distended due to habitus. +BS.  Extremities: Warm and well perfused. Right AKA well healed.  Neurological: A&Ox3, CN II - XII grossly intact.  Skin: No rashes or erythema  Psychiatric: Normal  mood and affect  Assessment & Plan:   See Encounters Tab for problem based charting.  Patient discussed with Dr. Daryll Drown

## 2016-06-08 NOTE — Patient Instructions (Signed)
[name],  It was a pleasure to see you today. I am glad you are doing well! We will obtain records from your nephrologist. Please follow up in 2 months or sooner if you need [place]. If you have any questions or concerns, call our clinic at [phone] or after hours call [phone] and ask for the internal medicine resident on call. Thank you!  - Dr. Philipp Ovens

## 2016-06-09 DIAGNOSIS — Z86711 Personal history of pulmonary embolism: Secondary | ICD-10-CM | POA: Insufficient documentation

## 2016-06-09 NOTE — Assessment & Plan Note (Signed)
Patient has a CKD stage 4 (per care everywhere records) and follows with [facility] (records not available).  -- Records request faxed

## 2016-06-09 NOTE — Assessment & Plan Note (Signed)
Patient has a history of type 1 diabetes. He previously followed with endocrinology at [facility] but is not planning to continue his care there once he moves back to [place]. His last A1c per care everywhere records was 8.2 on 05/02/2016. He reports being started on Tresiba 140 units daily last month. He also take Novolog 25 units TID and Linagliptin 5 mg daily. He does not have his glucometer with him today but reports he is doing well on the regimen.  -- Continue Tresiba 140 units daily -- Continue Novolog 25 units TID with meals -- Continue Linaglipitin 5 mg

## 2016-06-09 NOTE — Assessment & Plan Note (Signed)
Patient developed a right foot cellulitis that progressed to an abscess and ultimately tibial osteomyelitis that required a below the knee amputation. His post op course was complicated by a large clot formation at his amputation site that ultimately required surgical debridement and an above the knee amputation. Patient has been at [facility] center since his discharge in 2016. Plan is for discharge this month and transition to outpatient physical therapy. Patient is requesting a referral to a specific therapist Joycie Peek) at [facility].  -- PT referral placed

## 2016-06-09 NOTE — Assessment & Plan Note (Signed)
Blood pressure today is well controlled, 119/63. He currently takes Clonidine 0.1 mg BID, Hydralazine 50 mg QID, Metolazone 2.5 mg every other day, Lasix 80 mg BID, and 40 mEq of KCl daily. Patient reports he recently had lab work checked at [facility] earlier this week.  -- Continue above regimen -- Obtain records & lab work from [facility]

## 2016-06-09 NOTE — Assessment & Plan Note (Signed)
Patient is on long term anticoagulation due to a history of unprovoked DVT and PE in 2005 as well as a large DVT after his right BKA in 2016 that ultimately required revision to an AKA. He is prescribed Eliquis 5 mg BID and reports compliance.  -- Continue Eliquis 5 mg BID

## 2016-06-10 IMAGING — US US ABDOMEN LIMITED
1 series · 14 of 25 positions shown · non-contrast
Comparison: CT abdomen and pelvis 12/19/2013

CLINICAL DATA: Increased alkaline phosphatase. Nausea and vomiting
for a week.

EXAM:
US ABDOMEN LIMITED - RIGHT UPPER QUADRANT

[Series 1: us abdomen limited · 0.27mm/px · 14 of 39 slices shown]
[im 1/39]
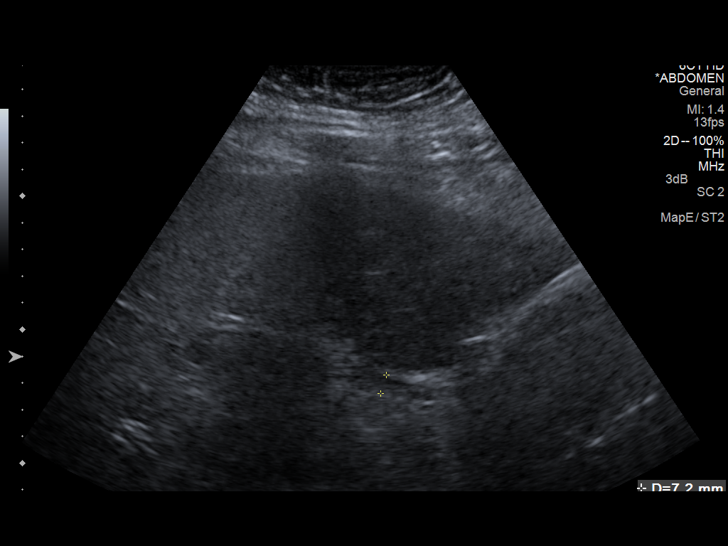
[im 4/39]
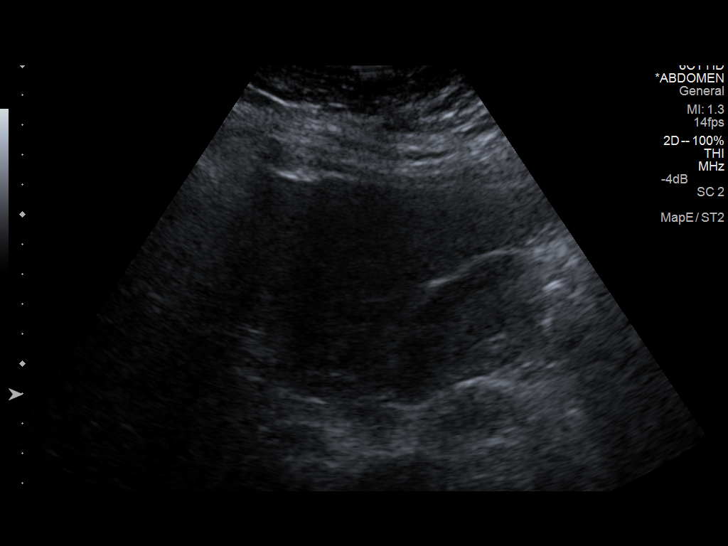
[im 7/39]
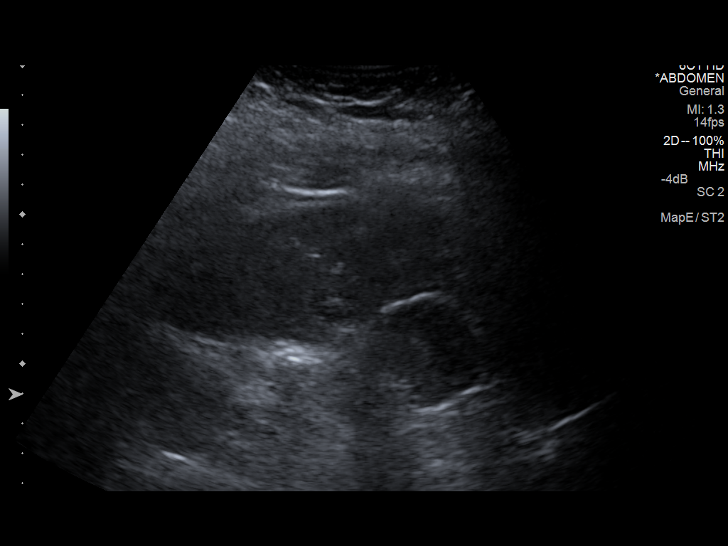
[im 10/39]
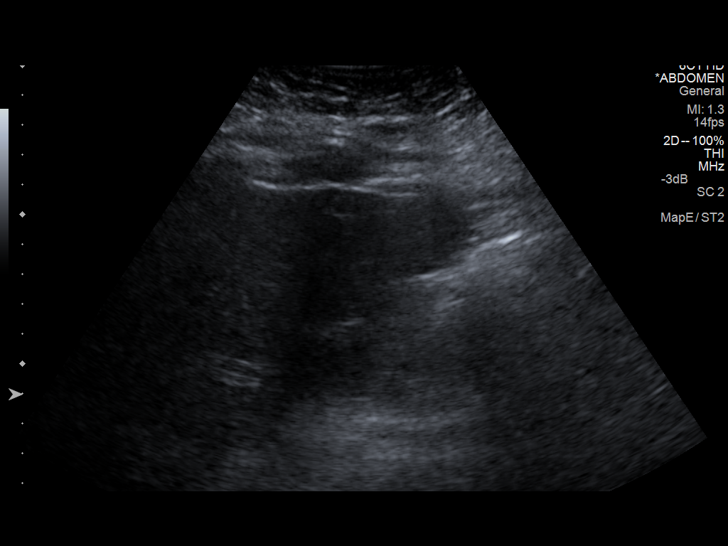
[im 13/39]
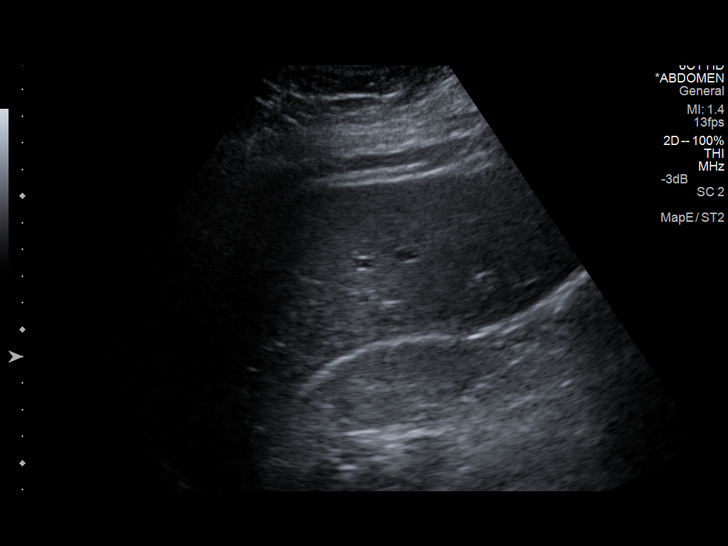
[im 15/39]
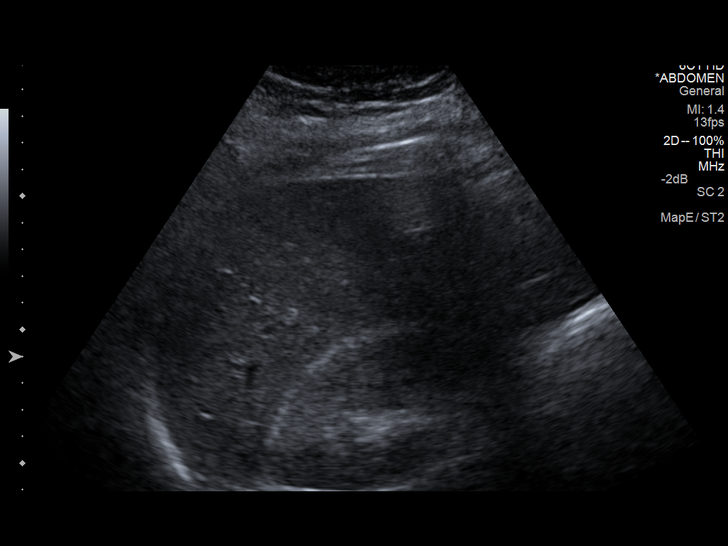
[im 18/39]
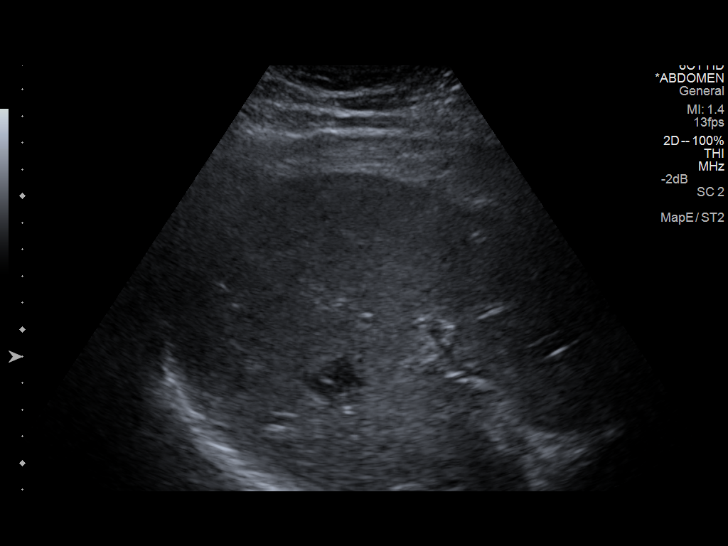
[im 21/39]
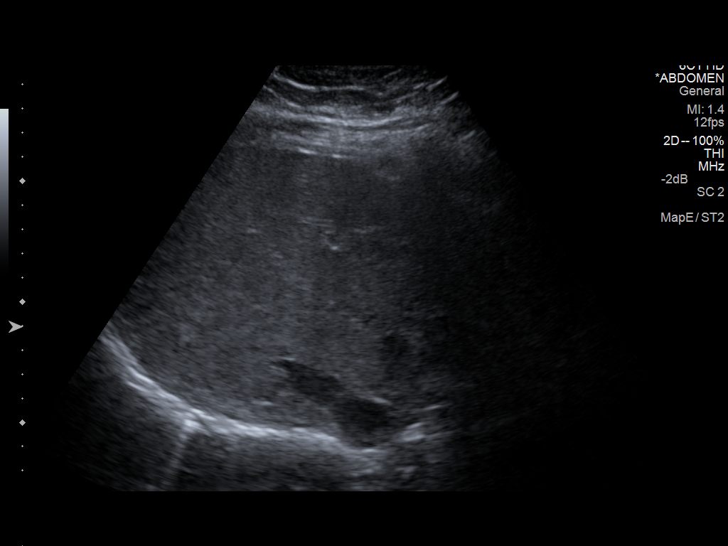
[im 24/39]
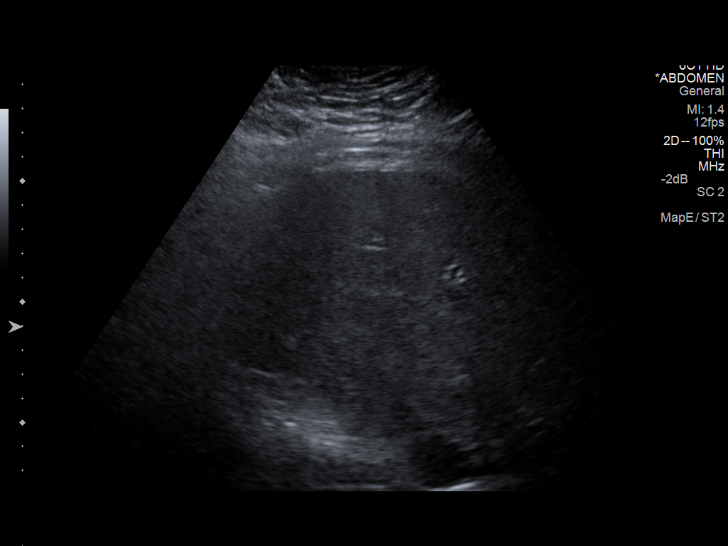
[im 26/39]
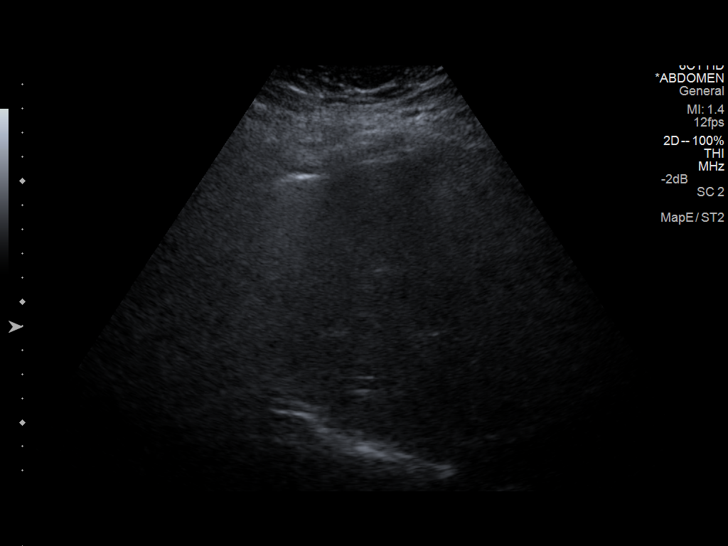
[im 29/39]
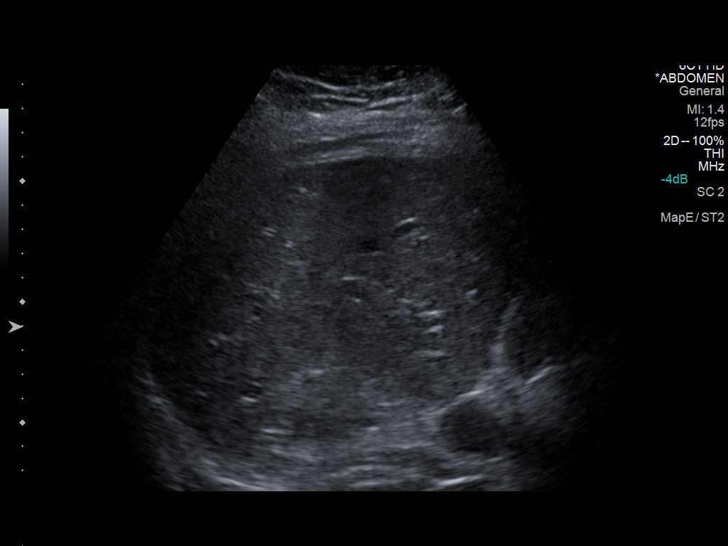
[im 32/39]
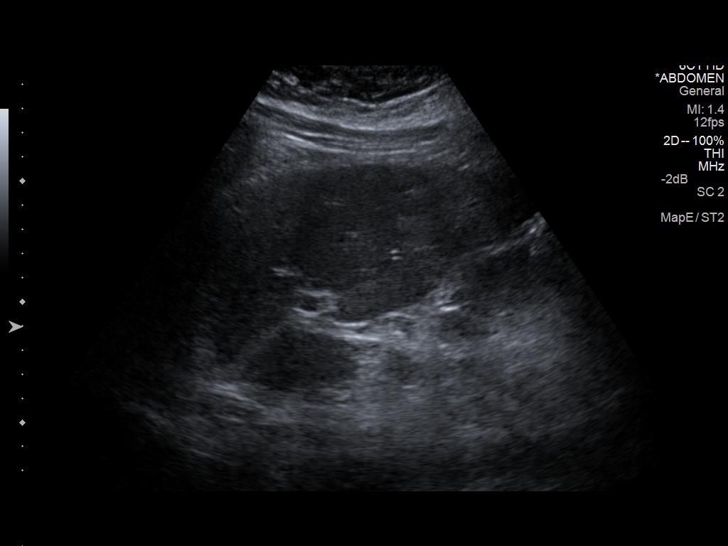
[im 35/39]
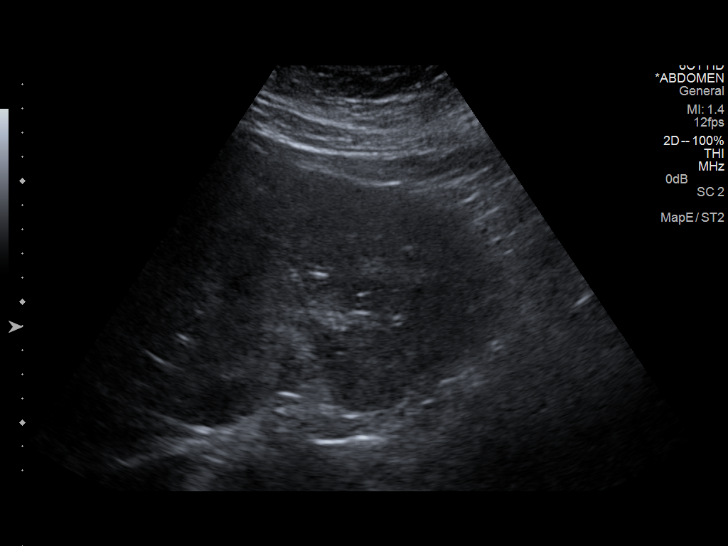
[im 39/39]
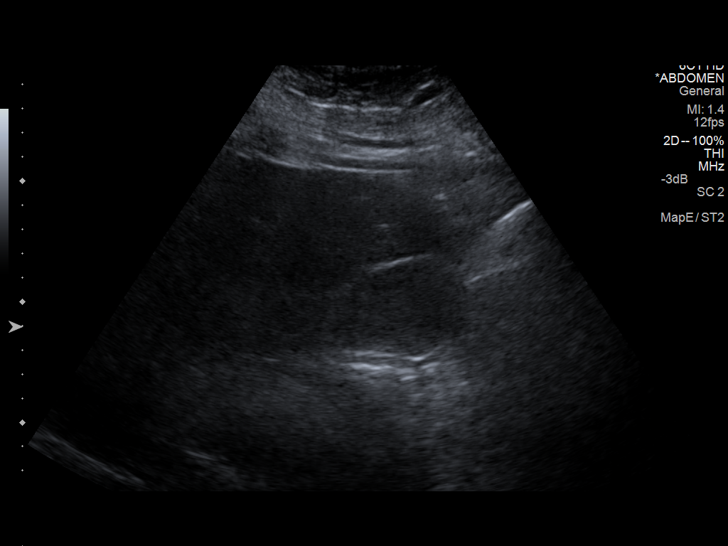

[14 of 25 positions shown; findings below may reference images not displayed]

FINDINGS: Technically limited examination due to patient's body habitus.

Gallbladder:

Gallbladder is somewhat contracted with diffusely increased echoes
pattern centrally consistent with small stones and sludge.
Borderline gallbladder wall thickness at 3.5 mm. Murphy's sign is
negative.

Common bile duct:

Diameter: 7.2 mm, upper limits of normal.

Liver:

Diffusely increased parenchymal echotexture suggesting fatty
infiltration. No focal liver lesions appreciated.
IMPRESSION: Mild gallbladder contraction. Gallbladder is filled with sludge and
small stones. Murphy's sign is negative.

## 2016-06-12 NOTE — Progress Notes (Signed)
Internal Medicine Clinic Attending  Case discussed with Dr. Guilloud at the time of the visit.  We reviewed the resident's history and exam and pertinent patient test results.  I agree with the assessment, diagnosis, and plan of care documented in the resident's note.  

## 2016-06-14 DIAGNOSIS — M6281 Muscle weakness (generalized): Secondary | ICD-10-CM | POA: Diagnosis not present

## 2016-06-14 DIAGNOSIS — R7989 Other specified abnormal findings of blood chemistry: Secondary | ICD-10-CM | POA: Diagnosis not present

## 2016-06-14 DIAGNOSIS — R197 Diarrhea, unspecified: Secondary | ICD-10-CM | POA: Diagnosis not present

## 2016-06-14 DIAGNOSIS — J111 Influenza due to unidentified influenza virus with other respiratory manifestations: Secondary | ICD-10-CM | POA: Diagnosis not present

## 2016-06-21 DIAGNOSIS — R162 Hepatomegaly with splenomegaly, not elsewhere classified: Secondary | ICD-10-CM | POA: Diagnosis not present

## 2016-06-28 ENCOUNTER — Telehealth: Payer: Self-pay | Admitting: Endocrinology

## 2016-06-28 NOTE — Telephone Encounter (Signed)
Patient calling to schedule new patient appointment. States that he talked to [place] recently and she was waiting on records.

## 2016-07-04 NOTE — Telephone Encounter (Signed)
Let the pt know that right now we are not accepting MCD

## 2016-07-10 ENCOUNTER — Encounter: Payer: Self-pay | Admitting: Internal Medicine

## 2016-07-10 DIAGNOSIS — K219 Gastro-esophageal reflux disease without esophagitis: Secondary | ICD-10-CM | POA: Diagnosis not present

## 2016-07-10 DIAGNOSIS — E785 Hyperlipidemia, unspecified: Secondary | ICD-10-CM | POA: Diagnosis not present

## 2016-07-10 DIAGNOSIS — D508 Other iron deficiency anemias: Secondary | ICD-10-CM | POA: Diagnosis not present

## 2016-07-10 DIAGNOSIS — I10 Essential (primary) hypertension: Secondary | ICD-10-CM | POA: Diagnosis not present

## 2016-07-14 NOTE — Addendum Note (Signed)
Addended by: Yvonna Alanis E on: 07/14/2016 10:20 AM   Modules accepted: Orders

## 2016-07-20 IMAGING — CR DG CHEST 2V
2 series · 3 of 3 positions shown · non-contrast
Comparison: 01/20/2014

CLINICAL DATA: Oxygen desaturation

EXAM:
CHEST  2 VIEW

[Series 1: chest lat · 0.14mm/px · 2 of 2 slices shown]
[im 1/2]
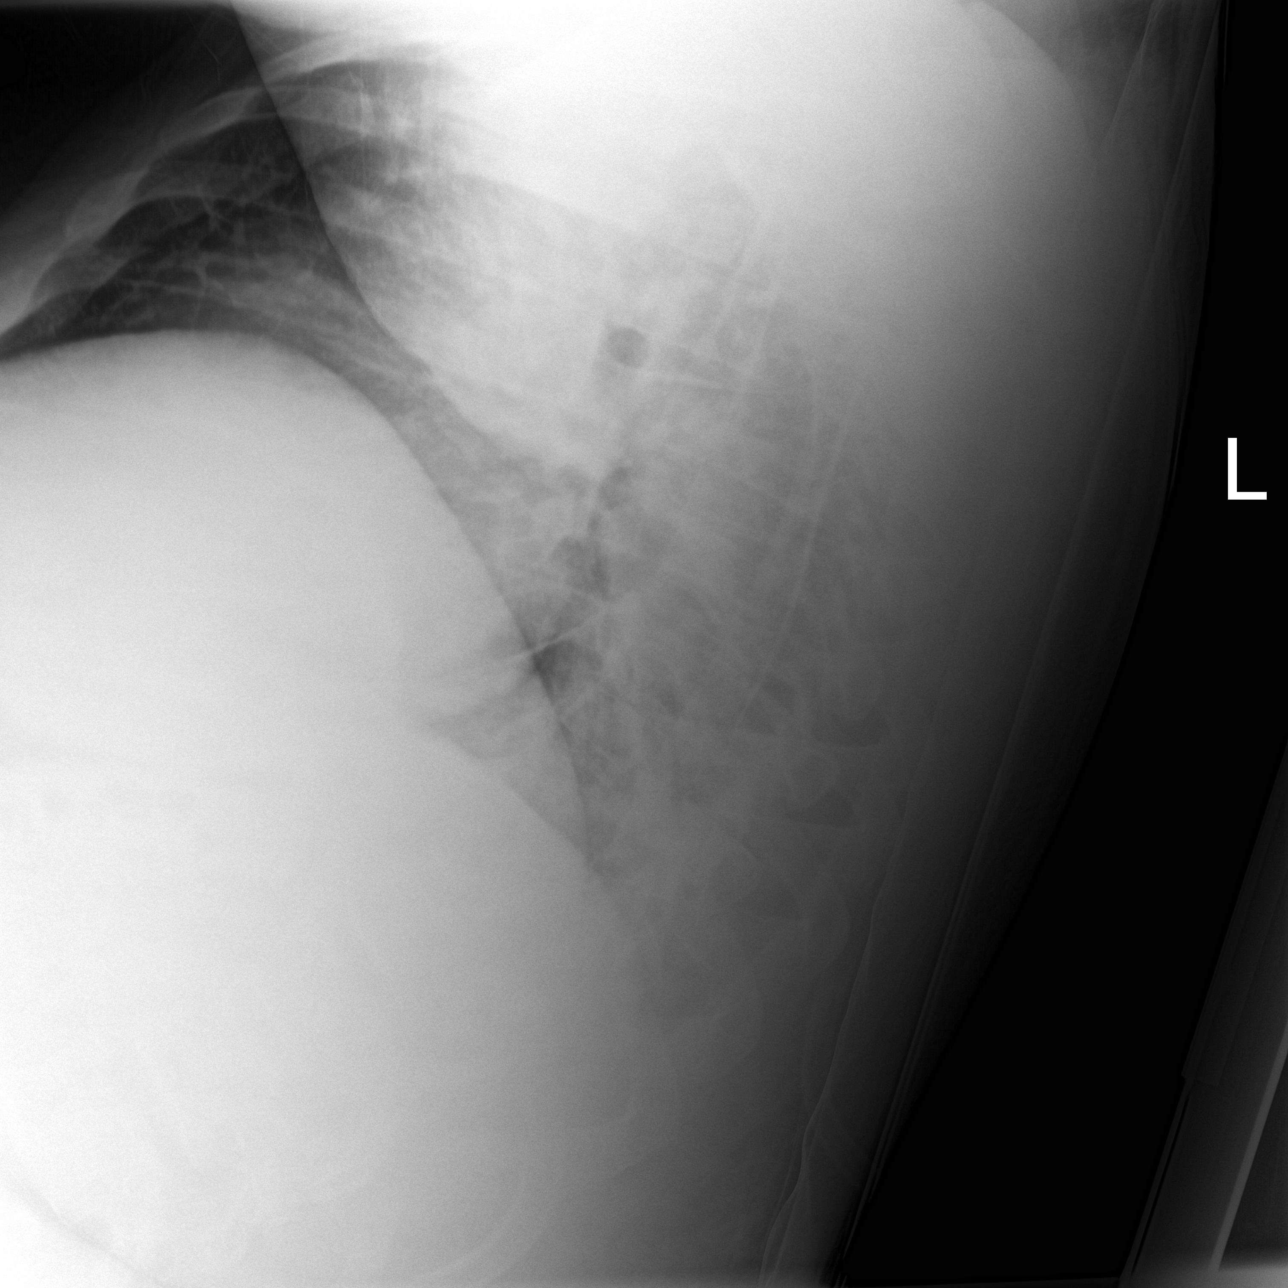
[im 2/2]
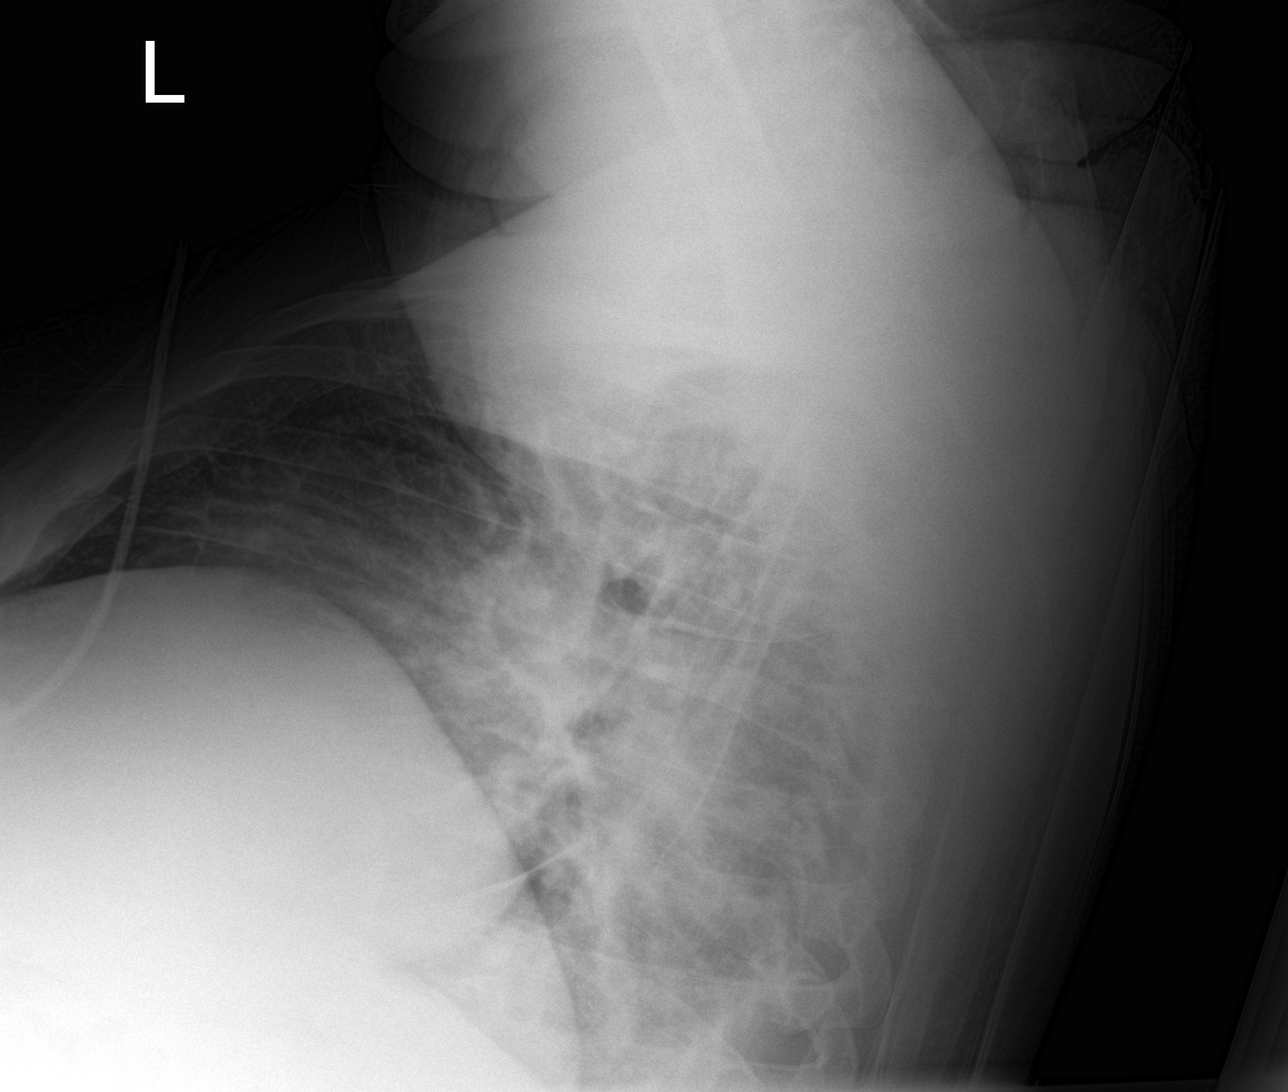

[chest ap]
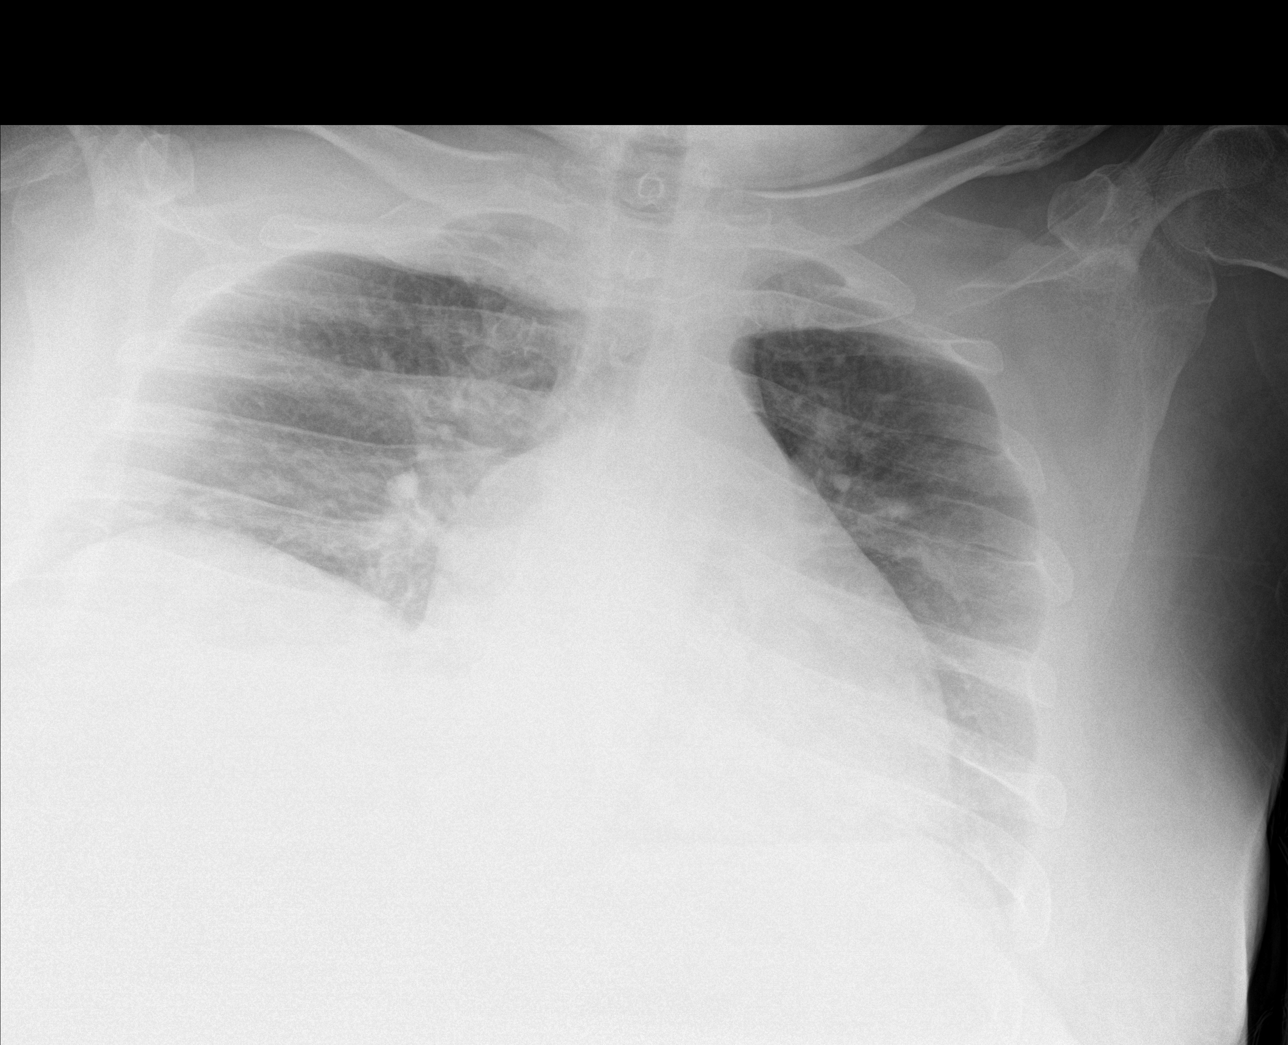

[3 of 3 positions shown; findings below may reference images not displayed]

FINDINGS: Marked hypo aeration, with interstitial crowding. No edema or
pneumonia is suspected. Heart size is normal. Negative aortic and
hilar contours.
IMPRESSION: Marked hypoventilation.

## 2016-07-21 IMAGING — US US ABDOMEN COMPLETE
1 series · 13 of 25 positions shown · non-contrast
Comparison: 12/21/2013

CLINICAL DATA: Elevated liver enzymes. Epigastric pain. Persistent
hematuria.

EXAM:
ULTRASOUND ABDOMEN COMPLETE

[Series 1: us abdomen complete · 0.24mm/px · 13 of 70 slices shown]
[im 1/70]
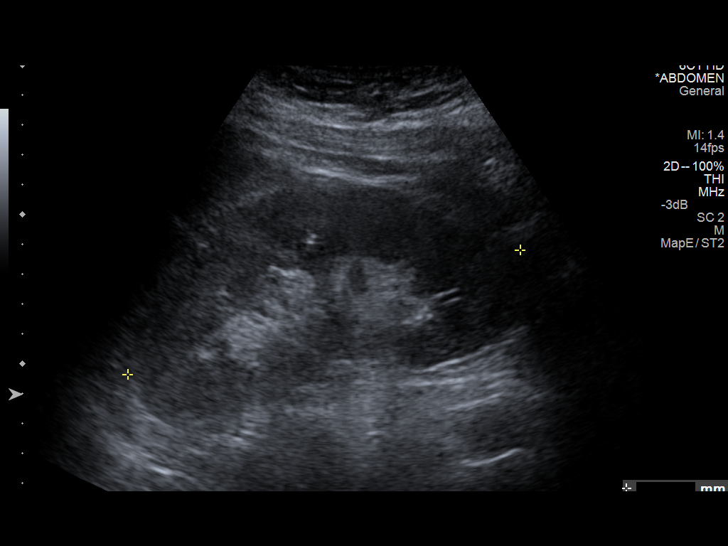
[im 6/70]
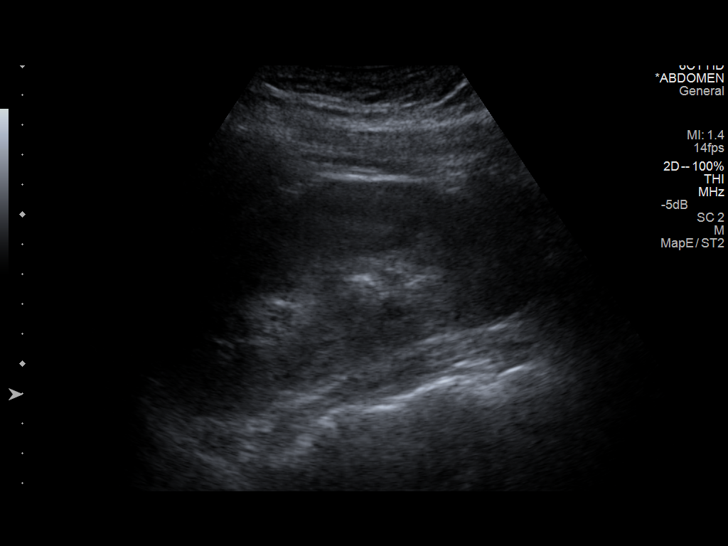
[im 12/70]
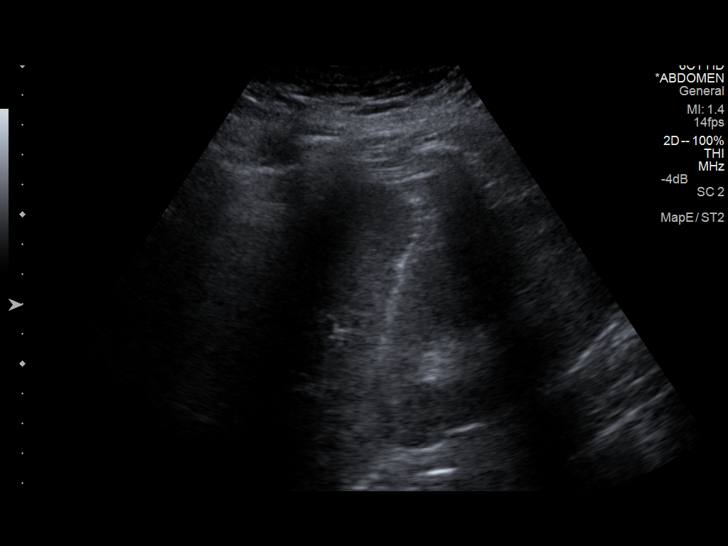
[im 18/70]
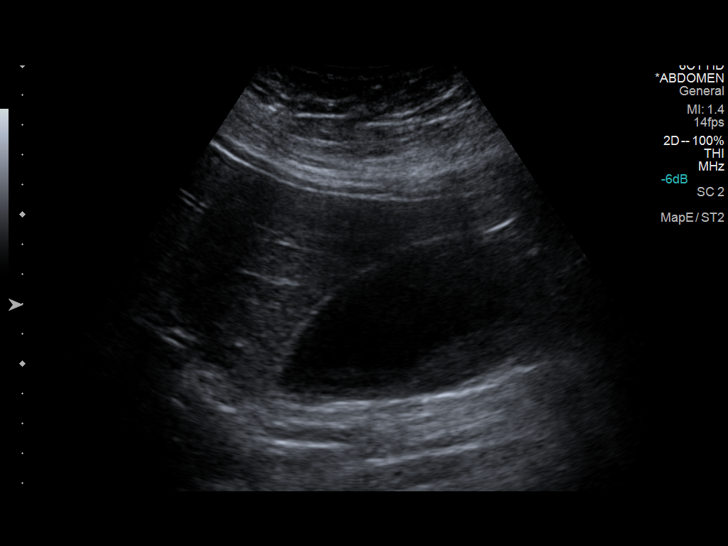
[im 24/70]
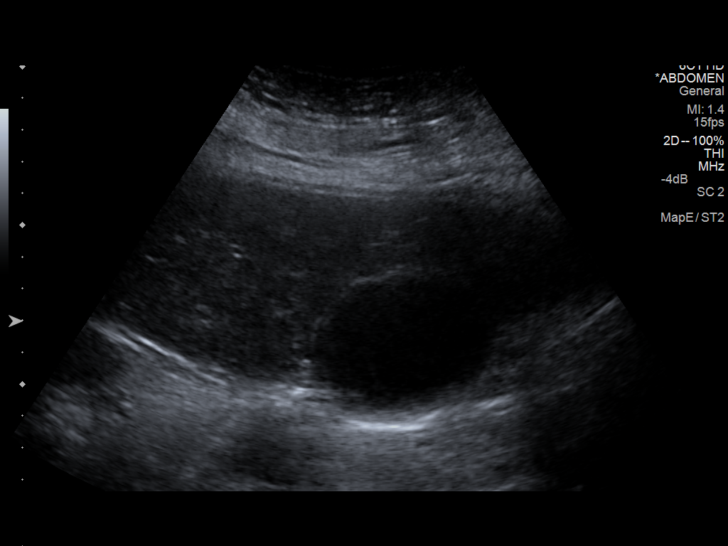
[im 29/70]
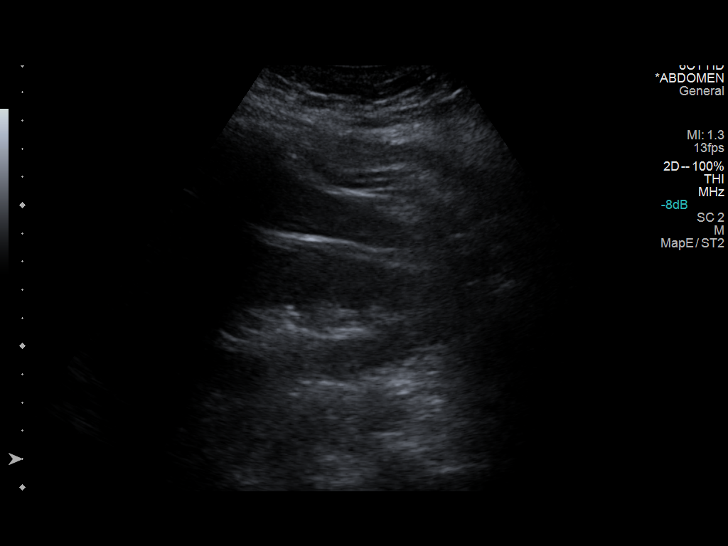
[im 35/70]
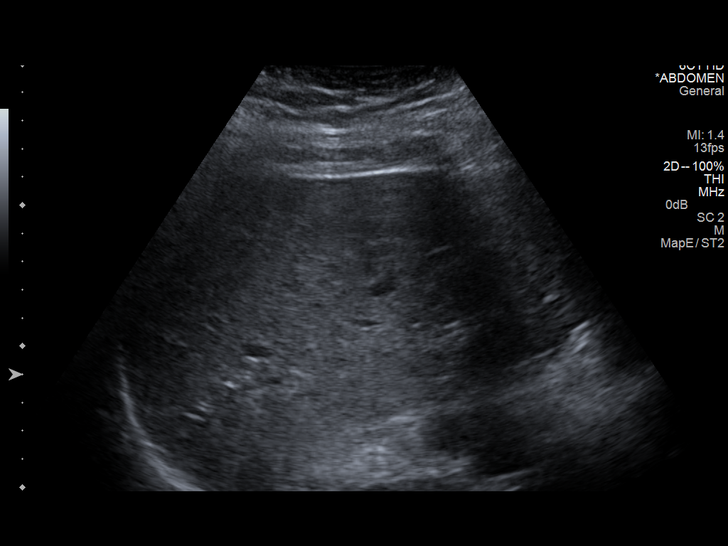
[im 41/70]
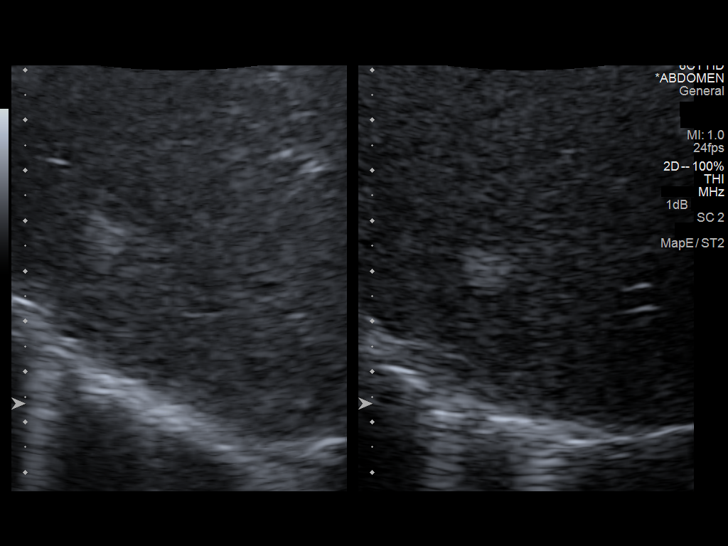
[im 47/70]
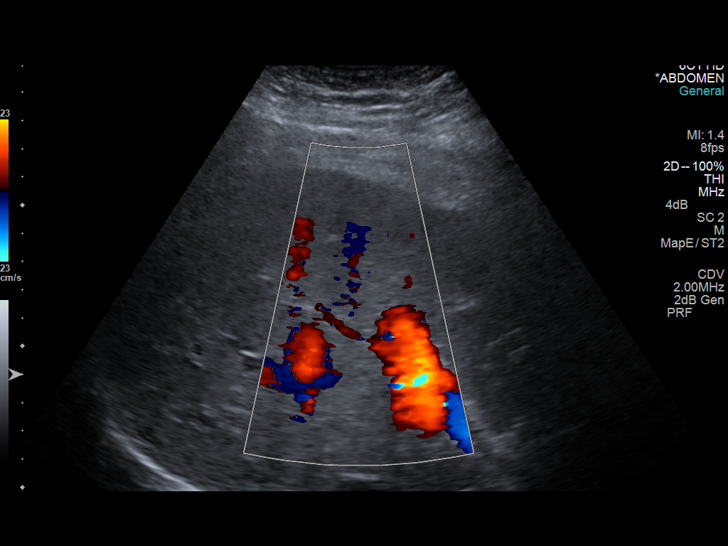
[im 52/70]
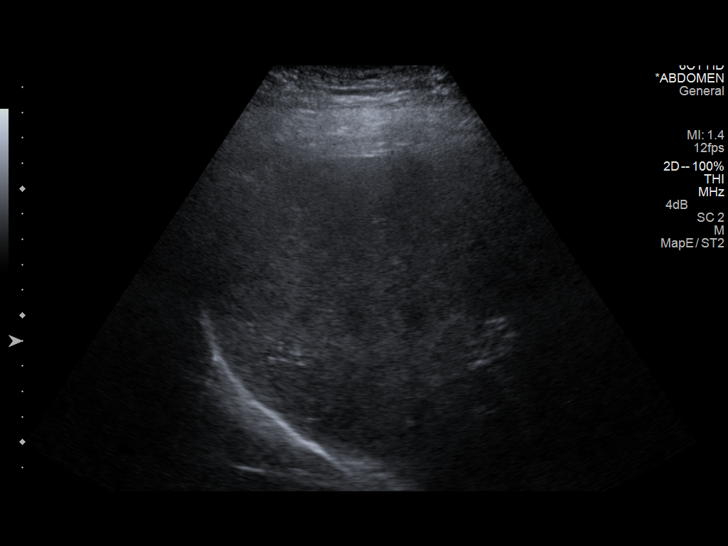
[im 58/70]
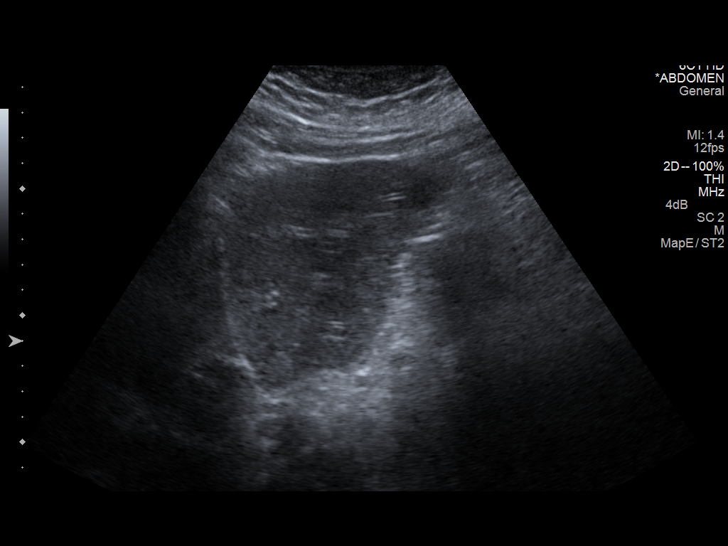
[im 64/70]
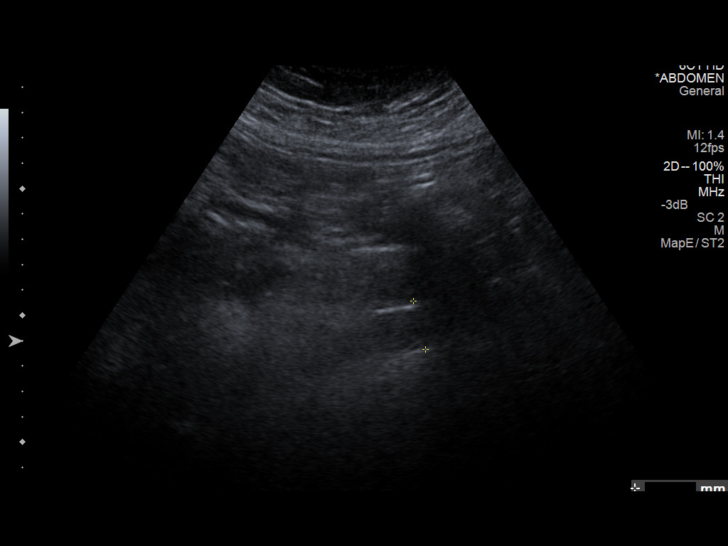
[im 70/70]
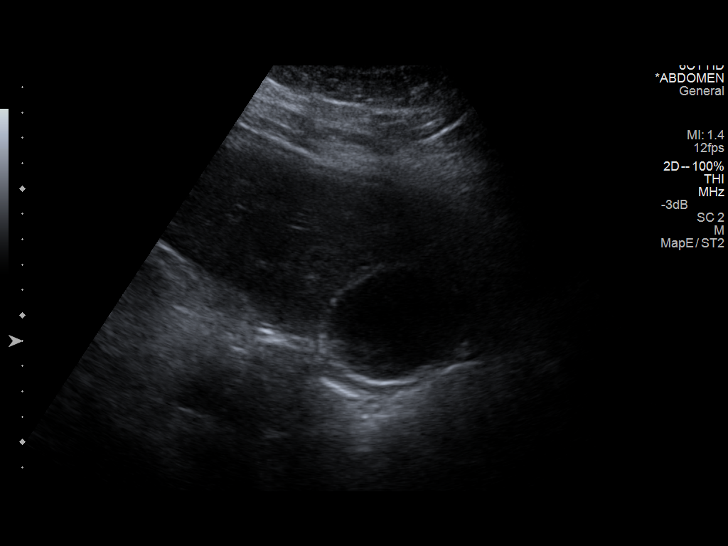

[13 of 25 positions shown; findings below may reference images not displayed]

FINDINGS: Gallbladder: Gallbladder sludge. No wall thickening or focal
tenderness.

Common bile duct: Diameter: 4 mm

Liver: There is a 12 mm right lobe mass which is echogenic and
without visible color Doppler flow. This is most likely a
hemangioma.

IVC: Limited imaging negative

Pancreas: Not visible

Spleen: Size and appearance within normal limits.

Right Kidney: Length: 13 cm, similar to abdominal CT 12/19/2013.
Enlarged and echogenic. No hydronephrosis.

Left Kidney: Length: 13 cm, similar to abdominal CT. Enlarged
echogenic. No hydronephrosis. The enlargement echogenicity of the
bilateral kidneys is likely from diabetic nephropathy. Renal vein
thrombosis is considered unlikely given symmetry, lack of definite
enlargement since previous CT, and recently improved creatinine.

Abdominal aorta: No aneurysm visualized.

Other findings: None.
IMPRESSION: 1. Gallbladder sludge.  No evidence of cholecystitis.
2. Large and echogenic bilateral kidneys as seen with diabetic
nephropathy.
3. 12 mm mass in the right liver, likely hemangioma.

## 2016-08-03 ENCOUNTER — Ambulatory Visit (INDEPENDENT_AMBULATORY_CARE_PROVIDER_SITE_OTHER): Payer: Medicare Other | Admitting: Internal Medicine

## 2016-08-03 VITALS — BP 169/83 | HR 87 | Temp 98.3°F | Ht 74.0 in

## 2016-08-03 DIAGNOSIS — E1065 Type 1 diabetes mellitus with hyperglycemia: Secondary | ICD-10-CM | POA: Diagnosis not present

## 2016-08-03 DIAGNOSIS — E1151 Type 2 diabetes mellitus with diabetic peripheral angiopathy without gangrene: Secondary | ICD-10-CM | POA: Diagnosis present

## 2016-08-03 DIAGNOSIS — I10 Essential (primary) hypertension: Secondary | ICD-10-CM

## 2016-08-03 DIAGNOSIS — Z794 Long term (current) use of insulin: Secondary | ICD-10-CM

## 2016-08-03 DIAGNOSIS — Z89511 Acquired absence of right leg below knee: Secondary | ICD-10-CM

## 2016-08-03 DIAGNOSIS — Z79899 Other long term (current) drug therapy: Secondary | ICD-10-CM | POA: Diagnosis not present

## 2016-08-03 DIAGNOSIS — IMO0002 Reserved for concepts with insufficient information to code with codable children: Secondary | ICD-10-CM

## 2016-08-03 DIAGNOSIS — E1051 Type 1 diabetes mellitus with diabetic peripheral angiopathy without gangrene: Secondary | ICD-10-CM

## 2016-08-03 LAB — GLUCOSE, CAPILLARY
GLUCOSE-CAPILLARY: 54 mg/dL — AB (ref 65–99)
Glucose-Capillary: 53 mg/dL — ABNORMAL LOW (ref 65–99)
Glucose-Capillary: 75 mg/dL (ref 65–99)

## 2016-08-03 LAB — POCT GLYCOSYLATED HEMOGLOBIN (HGB A1C): HEMOGLOBIN A1C: 6.2

## 2016-08-03 MED ORDER — ACCU-CHEK AVIVA DEVI: 0 refills | Status: DC

## 2016-08-03 MED ORDER — GLUCOSE BLOOD VI STRP: ORAL_STRIP | 12 refills | Status: DC

## 2016-08-03 MED ORDER — APIXABAN 5 MG PO TABS: 5.0000 mg | ORAL_TABLET | Freq: Two times a day (BID) | ORAL | 11 refills | Status: DC

## 2016-08-03 MED ORDER — CALCITRIOL 0.25 MCG PO CAPS: 0.2500 ug | ORAL_CAPSULE | Freq: Every day | ORAL | 11 refills | Status: DC

## 2016-08-03 MED ORDER — ACCU-CHEK SOFTCLIX LANCET DEV MISC: 0 refills | Status: DC

## 2016-08-03 MED ORDER — LINAGLIPTIN 5 MG PO TABS: 5.0000 mg | ORAL_TABLET | Freq: Every day | ORAL | 11 refills | Status: DC

## 2016-08-03 MED ORDER — INSULIN ASPART 100 UNIT/ML FLEXPEN: 25.0000 [IU] | PEN_INJECTOR | Freq: Three times a day (TID) | SUBCUTANEOUS | 11 refills | Status: DC

## 2016-08-03 MED ORDER — CLONIDINE HCL 0.1 MG PO TABS: 0.1000 mg | ORAL_TABLET | Freq: Two times a day (BID) | ORAL | 11 refills | Status: DC

## 2016-08-03 MED ORDER — CARVEDILOL 25 MG PO TABS: 25.0000 mg | ORAL_TABLET | Freq: Two times a day (BID) | ORAL | 11 refills | Status: DC

## 2016-08-03 MED ORDER — GABAPENTIN 300 MG PO CAPS: 300.0000 mg | ORAL_CAPSULE | Freq: Three times a day (TID) | ORAL | 11 refills | Status: DC

## 2016-08-03 MED ORDER — FUROSEMIDE 40 MG PO TABS: 80.0000 mg | ORAL_TABLET | Freq: Two times a day (BID) | ORAL | 11 refills | Status: DC

## 2016-08-03 MED ORDER — ALLOPURINOL 100 MG PO TABS: 200.0000 mg | ORAL_TABLET | Freq: Every day | ORAL | 11 refills | Status: DC

## 2016-08-03 MED ORDER — HYDRALAZINE HCL 50 MG PO TABS: 50.0000 mg | ORAL_TABLET | Freq: Four times a day (QID) | ORAL | 11 refills | Status: DC

## 2016-08-03 NOTE — Patient Instructions (Signed)
Mr. Ozment,  It was a pleasure meeting you today. Your prescriptions have been refilled and have been sent to the pharmacy. Please follow up in 2 weeks with me on August 9 to recheck your blood pressure.

## 2016-08-03 NOTE — Progress Notes (Signed)
   CC: Follow-up on essential hypertension and type 2 diabetes mellitus  HPI:  [name] is a 41 y.o. male with history noted below that presents to the internal medicine clinic for follow-up on essential hypertension and type 2 diabetes. Please see problem based charting for the status of patient's chronic medical conditions  Past Medical History:  Diagnosis Date  . Acute venous embolism and thrombosis of deep vessels of proximal lower extremity ([place]) 07/19/2011  . Chest pain, neg MI, normal coronaries by cath 02/18/2013  . CKD (chronic kidney disease) stage 3, GFR 30-59 ml/min 02/19/2013  . Diabetic ulcer of right foot ([place])   . DVT (deep venous thrombosis) ([place]) 09/2002   patient reports additional DVTs in '06 & '11 (unconfirmed)  . GERD (gastroesophageal reflux disease)   . Hyperlipidemia 02/19/2013  . Hypertension   . Nephrotic syndrome   . Obesity    BMI 44, weight 346 pounds 01/30/14  . Pulmonary embolism ([place]) 09/2002   treated with 6 months of warfarin  . Type I diabetes mellitus ([place]) dx'd 2001    Review of Systems:  Review of Systems  Constitutional: Negative for malaise/fatigue.  Eyes: Negative for blurred vision.  Respiratory: Negative for cough and shortness of breath.   Cardiovascular: Negative for chest pain.  Gastrointestinal: Negative for nausea and vomiting.  Neurological: Negative for dizziness, tingling, weakness and headaches.     Physical Exam:  Vitals:   08/03/16 1505 08/03/16 1624  BP: (!) 184/81 (!) 169/83  Pulse: 91 87  Temp: 98.3 F (36.8 C)   TempSrc: Oral   SpO2: 99%   Height: 6\' 2"  (1.88 m)    Physical Exam  Constitutional: He is well-developed, well-nourished, and in no distress.  Cardiovascular: Normal rate, regular rhythm and normal heart sounds.  Exam reveals no gallop and no friction rub.   No murmur heard. Pulmonary/Chest: Breath sounds normal. No respiratory distress. He has no wheezes. He has no rales.  Musculoskeletal:  BKA  of right side  Skin: Skin is warm and dry.    Assessment & Plan:   See encounters tab for problem based medical decision making.    Patient discussed with Dr. Beryle Beams

## 2016-08-04 ENCOUNTER — Other Ambulatory Visit: Payer: Self-pay | Admitting: Internal Medicine

## 2016-08-04 ENCOUNTER — Telehealth: Payer: Self-pay | Admitting: Dietician

## 2016-08-04 ENCOUNTER — Other Ambulatory Visit: Payer: Self-pay | Admitting: *Deleted

## 2016-08-04 LAB — BMP8+ANION GAP
ANION GAP: 15 mmol/L (ref 10.0–18.0)
BUN / CREAT RATIO: 22 — AB (ref 9–20)
BUN: 43 mg/dL — ABNORMAL HIGH (ref 6–20)
CO2: 21 mmol/L (ref 20–29)
CREATININE: 1.92 mg/dL — AB (ref 0.76–1.27)
Calcium: 9.2 mg/dL (ref 8.7–10.2)
Chloride: 105 mmol/L (ref 96–106)
GFR, EST AFRICAN AMERICAN: 50 mL/min/{1.73_m2} — AB (ref >59)
GFR, EST NON AFRICAN AMERICAN: 43 mL/min/{1.73_m2} — AB (ref >59)
Glucose: 53 mg/dL — ABNORMAL LOW (ref 65–99)
Potassium: 4.5 mmol/L (ref 3.5–5.2)
SODIUM: 141 mmol/L (ref 134–144)

## 2016-08-04 LAB — CBC WITH DIFFERENTIAL/PLATELET
BASOS: 0 %
Basophils Absolute: 0 10*3/uL (ref 0.0–0.2)
EOS (ABSOLUTE): 0.3 10*3/uL (ref 0.0–0.4)
EOS: 4 %
HEMATOCRIT: 33.2 % — AB (ref 37.5–51.0)
Hemoglobin: 10.6 g/dL — ABNORMAL LOW (ref 13.0–17.7)
IMMATURE GRANULOCYTES: 0 %
Immature Grans (Abs): 0 10*3/uL (ref 0.0–0.1)
Lymphocytes Absolute: 1.7 10*3/uL (ref 0.7–3.1)
Lymphs: 21 %
MCH: 28 pg (ref 26.6–33.0)
MCHC: 31.9 g/dL (ref 31.5–35.7)
MCV: 88 fL (ref 79–97)
MONOS ABS: 1 10*3/uL — AB (ref 0.1–0.9)
Monocytes: 12 %
NEUTROS ABS: 5.2 10*3/uL (ref 1.4–7.0)
NEUTROS PCT: 63 %
Platelets: 276 10*3/uL (ref 150–379)
RBC: 3.79 x10E6/uL — ABNORMAL LOW (ref 4.14–5.80)
RDW: 15.4 % (ref 12.3–15.4)
WBC: 8.4 10*3/uL (ref 3.4–10.8)

## 2016-08-04 NOTE — Telephone Encounter (Signed)
Patient has questions about his Lancets and Test Strips and DX CODE.

## 2016-08-04 NOTE — Telephone Encounter (Signed)
Pt stated [facility] needs new rxs  For test sPtrips and lancets - need to include specific directions, how often he tests and dx code which I added, please check. Also asked about PT referral - I did not see an order. Also I called Lovastatin rx , written per Dr Philipp Ovens to [place]; it was not sent electronically. Thanks

## 2016-08-04 NOTE — Telephone Encounter (Signed)
I spoke with [name]. Appointment scheduled.

## 2016-08-06 MED ORDER — ACCU-CHEK SOFTCLIX LANCET DEV MISC: 0 refills | Status: DC

## 2016-08-06 MED ORDER — GLUCOSE BLOOD VI STRP: ORAL_STRIP | 12 refills | Status: DC

## 2016-08-08 DIAGNOSIS — E1122 Type 2 diabetes mellitus with diabetic chronic kidney disease: Secondary | ICD-10-CM | POA: Diagnosis not present

## 2016-08-08 DIAGNOSIS — N2581 Secondary hyperparathyroidism of renal origin: Secondary | ICD-10-CM | POA: Diagnosis not present

## 2016-08-08 DIAGNOSIS — D631 Anemia in chronic kidney disease: Secondary | ICD-10-CM | POA: Diagnosis not present

## 2016-08-08 DIAGNOSIS — I10 Essential (primary) hypertension: Secondary | ICD-10-CM | POA: Diagnosis not present

## 2016-08-08 DIAGNOSIS — E119 Type 2 diabetes mellitus without complications: Secondary | ICD-10-CM | POA: Diagnosis not present

## 2016-08-08 DIAGNOSIS — N183 Chronic kidney disease, stage 3 (moderate): Secondary | ICD-10-CM | POA: Diagnosis not present

## 2016-08-08 DIAGNOSIS — Z794 Long term (current) use of insulin: Secondary | ICD-10-CM | POA: Diagnosis not present

## 2016-08-08 DIAGNOSIS — Z6841 Body Mass Index (BMI) 40.0 and over, adult: Secondary | ICD-10-CM | POA: Diagnosis not present

## 2016-08-08 DIAGNOSIS — Z89619 Acquired absence of unspecified leg above knee: Secondary | ICD-10-CM | POA: Diagnosis not present

## 2016-08-08 DIAGNOSIS — Z86718 Personal history of other venous thrombosis and embolism: Secondary | ICD-10-CM | POA: Diagnosis not present

## 2016-08-08 MED ORDER — POTASSIUM CHLORIDE CRYS ER 20 MEQ PO TBCR: 20.0000 meq | EXTENDED_RELEASE_TABLET | Freq: Every day | ORAL | 2 refills | Status: DC

## 2016-08-08 NOTE — Assessment & Plan Note (Addendum)
Assessment:  Essential hypertension Blood pressure today was 184/81. He currently takes Clonidine 0.1 mg BID, Hydralazine 50 mg QID, Lasix 80 mg BID.  Patient states he hasn't taken the second portion of his medications today.  He took his hydralazine and clonidine in the office and repeat BP was 169/83.  Previous clinic visit showed a blood pressure of 119/63 and patient states he was on the same medications. Patient denies headache,dizziness, vision changes,shortness of breath, chest pain, or nausea/vomiting.  Repeat blood pressure was 169/83. I will not make any changes to his blood pressure med regimen at this time. I asked patient to continue to take his medications as prescribed and follow-up with me on 8/9.  Plan -  Reassess blood pressure on 8/9 in order to assess if medication changes need to be made  Addendum It was later noted that with subsequent clinic patients the electronic blood pressure was over estimating patient's blood pressures compared to manual blood pressures.  This may explain the elevation although will reassess again at the 8/9 appointment

## 2016-08-08 NOTE — Progress Notes (Signed)
Medicine attending: Medical history, presenting problems, physical findings, and medications, reviewed with resident physician Dr Jessica Hoffman on the day of the patient visit and I concur with her evaluation and management plan. 

## 2016-08-08 NOTE — Assessment & Plan Note (Signed)
Assessment:  Type II diabetes Mellitus Hemoglobin A1C last noted per care everywhere was 8.2 and patient was started on tresiba 140 units daily.  He also takes novolog 25 units TID and linagliptin 5mg  daily.  Today's hemoglobin A1C is 6.2 and at goal.  Will continue with current med regimen   Plan - hemoglobin A1C - foot exam - Continue Tresiba 140 units daily  - Continue Novolog 25 units TID with meals   - Continue Linaglipitin 5 mg  - meter, lancets and strips

## 2016-08-10 NOTE — Telephone Encounter (Signed)
Pt needs to speak with a nurse about Lancets and test strips. Please call pt back.

## 2016-08-11 ENCOUNTER — Other Ambulatory Visit: Payer: Self-pay | Admitting: *Deleted

## 2016-08-11 MED ORDER — APIXABAN 5 MG PO TABS: 5.0000 mg | ORAL_TABLET | Freq: Two times a day (BID) | ORAL | 3 refills | Status: DC

## 2016-08-11 MED ORDER — INSULIN ASPART 100 UNIT/ML FLEXPEN: 25.0000 [IU] | PEN_INJECTOR | Freq: Three times a day (TID) | SUBCUTANEOUS | 11 refills | Status: DC

## 2016-08-11 MED ORDER — "PEN NEEDLES 3/16"" 31G X 5 MM MISC": 3 refills | Status: DC

## 2016-08-11 MED ORDER — HYDRALAZINE HCL 50 MG PO TABS: 50.0000 mg | ORAL_TABLET | Freq: Four times a day (QID) | ORAL | 3 refills | Status: DC

## 2016-08-11 MED ORDER — ACCU-CHEK AVIVA DEVI: 0 refills | Status: AC

## 2016-08-11 MED ORDER — GABAPENTIN 300 MG PO CAPS: 300.0000 mg | ORAL_CAPSULE | Freq: Three times a day (TID) | ORAL | 3 refills | Status: DC

## 2016-08-11 MED ORDER — LINAGLIPTIN 5 MG PO TABS: 5.0000 mg | ORAL_TABLET | Freq: Every day | ORAL | 3 refills | Status: DC

## 2016-08-11 MED ORDER — GLUCOSE BLOOD VI STRP: ORAL_STRIP | 12 refills | Status: DC

## 2016-08-11 MED ORDER — POTASSIUM CHLORIDE CRYS ER 20 MEQ PO TBCR: 20.0000 meq | EXTENDED_RELEASE_TABLET | Freq: Every day | ORAL | 3 refills | Status: DC

## 2016-08-11 MED ORDER — INSULIN DEGLUDEC 200 UNIT/ML ~~LOC~~ SOPN: 140.0000 [IU] | PEN_INJECTOR | Freq: Every day | SUBCUTANEOUS | 3 refills | Status: DC

## 2016-08-11 MED ORDER — CARVEDILOL 25 MG PO TABS: 25.0000 mg | ORAL_TABLET | Freq: Two times a day (BID) | ORAL | 3 refills | Status: DC

## 2016-08-11 MED ORDER — CLONIDINE HCL 0.1 MG PO TABS: 0.1000 mg | ORAL_TABLET | Freq: Two times a day (BID) | ORAL | 3 refills | Status: DC

## 2016-08-11 MED ORDER — FUROSEMIDE 40 MG PO TABS: 80.0000 mg | ORAL_TABLET | Freq: Two times a day (BID) | ORAL | 3 refills | Status: DC

## 2016-08-11 MED ORDER — CALCITRIOL 0.25 MCG PO CAPS: 0.2500 ug | ORAL_CAPSULE | Freq: Every day | ORAL | 3 refills | Status: DC

## 2016-08-11 MED ORDER — ACCU-CHEK SOFTCLIX LANCET DEV MISC: 3 refills | Status: DC

## 2016-08-11 NOTE — Telephone Encounter (Signed)
done

## 2016-08-11 NOTE — Telephone Encounter (Signed)
Spoke to pt, called [facility], dr [employer] pecos # is not working, will send to [employer], resubmitted scripts to attending

## 2016-08-14 ENCOUNTER — Other Ambulatory Visit: Payer: Self-pay | Admitting: *Deleted

## 2016-08-14 MED ORDER — ACCU-CHEK SOFTCLIX LANCET DEV MISC
3 refills | Status: DC
Start: 2016-08-14 — End: 2023-04-27

## 2016-08-14 MED ORDER — "PEN NEEDLES 3/16"" 31G X 5 MM MISC": 3 refills | Status: DC

## 2016-08-14 MED ORDER — GLUCOSE BLOOD VI STRP: ORAL_STRIP | 12 refills | Status: DC

## 2016-08-14 NOTE — Telephone Encounter (Signed)
Per [facility] dx code E10.51 needs to be changed. I changed code to E11.9. Also pt stated he gives himself injections 5 times a day.

## 2016-08-14 NOTE — Telephone Encounter (Signed)
Pt called / informed of refills. 

## 2016-08-15 ENCOUNTER — Encounter: Payer: Self-pay | Admitting: Physical Therapy

## 2016-08-15 ENCOUNTER — Ambulatory Visit: Payer: Medicare Other | Attending: Internal Medicine | Admitting: Physical Therapy

## 2016-08-15 DIAGNOSIS — R2681 Unsteadiness on feet: Secondary | ICD-10-CM | POA: Diagnosis not present

## 2016-08-15 DIAGNOSIS — X58XXXS Exposure to other specified factors, sequela: Secondary | ICD-10-CM | POA: Insufficient documentation

## 2016-08-15 DIAGNOSIS — M6281 Muscle weakness (generalized): Secondary | ICD-10-CM | POA: Diagnosis not present

## 2016-08-15 DIAGNOSIS — R2689 Other abnormalities of gait and mobility: Secondary | ICD-10-CM | POA: Diagnosis not present

## 2016-08-15 DIAGNOSIS — S78111S Complete traumatic amputation at level between right hip and knee, sequela: Secondary | ICD-10-CM | POA: Diagnosis not present

## 2016-08-15 DIAGNOSIS — M25651 Stiffness of right hip, not elsewhere classified: Secondary | ICD-10-CM | POA: Diagnosis not present

## 2016-08-16 NOTE — Therapy (Addendum)
[facility] [address] [place], [place], [address] Phone: [phone]   Fax:  [phone]  Physical Therapy Evaluation  Patient Details  Name: [name] MRN: [id number] Date of Birth: November 25, 1975 Referring Provider: Kalman Shan MD  Encounter Date: 08/15/2016      PT End of Session - 08/15/16 1318    Visit Number 1   Number of Visits 9   Authorization Type Medicaid   PT Start Time 9924   PT Stop Time 1059   PT Time Calculation (min) 44 min   Activity Tolerance Patient limited by fatigue;Patient limited by pain   Behavior During Therapy [facility] for tasks assessed/performed      Past Medical History:  Diagnosis Date  . Acute venous embolism and thrombosis of deep vessels of proximal lower extremity ([place]) 07/19/2011  . Chest pain, neg MI, normal coronaries by cath 02/18/2013  . CKD (chronic kidney disease) stage 3, GFR 30-59 ml/min 02/19/2013  . Diabetic ulcer of right foot ([place])   . DVT (deep venous thrombosis) ([place]) 09/2002   patient reports additional DVTs in '06 & '11 (unconfirmed)  . GERD (gastroesophageal reflux disease)   . Hyperlipidemia 02/19/2013  . Hypertension   . Nephrotic syndrome   . Obesity    BMI 44, weight 346 pounds 01/30/14  . Pulmonary embolism ([place]) 09/2002   treated with 6 months of warfarin  . Type I diabetes mellitus ([place]) dx'd 2001    Past Surgical History:  Procedure Laterality Date  . AMPUTATION Right 12/22/2013   Procedure: AMPUTATION BELOW KNEE;  Surgeon: [name], MD;  Location: [facility];  Service: Orthopedics;  Laterality: Right;  . AMPUTATION Right 02/19/2014   Procedure: RIGHT ABOVE KNEE AMPUTATION ;  Surgeon: [name], MD;  Location: [facility];  Service: Orthopedics;  Laterality: Right;  . CARDIAC CATHETERIZATION  02/18/2013   normal coronaries  . I&D EXTREMITY Right 12/14/2013   Procedure: IRRIGATION AND DEBRIDEMENT RIGHT FOOT;  Surgeon: [name], MD;  Location: [facility];   Service: Orthopedics;  Laterality: Right;  . INCISION AND DRAINAGE ABSCESS  2007; 2015   "back"  . INCISION AND DRAINAGE OF WOUND Right 12/22/2013   Procedure: I&D RIGHT BUTTOCK;  Surgeon: [name], MD;  Location: [facility];  Service: Orthopedics;  Laterality: Right;  . LEFT HEART CATHETERIZATION WITH CORONARY ANGIOGRAM N/A 02/18/2013   Procedure: LEFT HEART CATHETERIZATION WITH CORONARY ANGIOGRAM;  Surgeon: [name], MD;  Location: [facility] CATH LAB;  Service: Cardiovascular;  Laterality: N/A;    There were no vitals filed for this visit.       Subjective Assessment - 08/15/16 1016    Subjective This 41yo developed right foot cellulitis that progressed to abcess/osteomyelitis. He underwent a right Transtibial Amputation & developed a large clot. He underwent a right Transfemoral Amputation on 02/19/2014. He has been at [facility]. He recieved his first prosthesis Nov. 2016. He was discharged from [facility] on 07/10/2016 to live independently in apartment.  PT referral from Dr. Kalman Shan to improve prosthetic use, gait & safety.   Pertinent History Right TFA, Type 1 DM, HTN, chest pain, DVT/PE, nephrotic syndrome, morbid obesity, LBP   Patient Stated Goals To walk with prosthesis in community. Play flag football & basketball.    Currently in Pain? No/denies            [facility] PT Assessment - 08/15/16 1022      Assessment   Medical Diagnosis R AKA   Referring  Provider Kalman Shan MD   Onset Date/Surgical Date 08/14/16   Prior Therapy SNF facility     Precautions   Precautions Fall     Balance Screen   Has the patient fallen in the past 6 months No   Has the patient had a decrease in activity level because of a fear of falling?  Yes   Is the patient reluctant to leave their home because of a fear of falling?  No     Home Environment   Living Environment Private residence  apartment   [place] - 2 wheels;Tub bench;Wheelchair - manual;Grab bars - toilet   Additional Comments First floor, no steps, all paved surfaces     Prior Function   Level of Independence Independent  before amputation and cellulitis   Vocation Full time employment   Curator, carrying, standing, reaching  Worked at [employer], basketball, running     Cognition   Overall Cognitive Status Within Functional Limits for tasks assessed   Attention Focused   Focused Attention Appears intact   Memory Appears intact   Awareness Appears intact   Problem Solving Appears intact     Observation/Other Assessments   Focus on Therapeutic Outcomes (FOTO)  45.29 Functional Status   Activities of Balance Confidence Scale (ABC Scale)  6.3%     Posture/Postural Control   Posture/Postural Control Postural limitations   Postural Limitations Rounded Shoulders;Forward head;Increased lumbar lordosis;Flexed trunk;Weight shift left     ROM / Strength   AROM / PROM / Strength AROM;Strength     AROM   Overall AROM  Deficits;Within functional limits for tasks performed   Overall AROM Comments BUEs & LLE grossly WFL   AROM Assessment Site Hip   Right/Left Hip Right   Right Hip Extension -12  Thomas Position     Strength   Overall Strength Within functional limits for tasks performed   Overall Strength Comments 5/5 B UEs, L LE, R hip tested in sitting   Strength Assessment Site Shoulder;Elbow;Hand;Hip;Knee;Ankle     Transfers   Transfers Sit to Stand;Stand to Sit   Sit to Stand 3: Mod assist;With upper extremity assist;Multiple attempts   Stand to Sit 4: Min guard;With upper extremity assist;Uncontrolled descent;To chair/3-in-1     Ambulation/Gait   Ambulation/Gait Yes   Ambulation/Gait Assistance 3: Mod assist  with wheelchair following as fatigues rapidly   Ambulation/Gait Assistance Details Excessive UE WB on RW    Ambulation Distance (Feet) 20 Feet   Assistive device Rolling walker   Gait Pattern Decreased weight shift to right;Step-through pattern;Decreased step length - left;Decreased stance time - right;Decreased stride length;Decreased hip/knee flexion - right;Right hip hike;Right circumduction;Antalgic;Lateral hip instability;Trunk flexed;Abducted- right;Poor foot clearance - right  prosthetic terminal impact   Ambulation Surface Level;Indoor     Standardized Balance Assessment   Standardized Balance Assessment Berg Balance Test     Berg Balance Test   Sit to Stand Needs moderate or maximal assist to stand  requires support of RW for stabilization   Standing Unsupported Unable to stand 30 seconds unassisted  RW support, stands 60 seconds   Sitting with Back Unsupported but Feet Supported on Floor or Stool Able to sit safely and securely 2 minutes   Stand to Sit Sits independently, has uncontrolled descent  uses RW support for stabilization  Transfers Able to transfer safely, definite need of hands  No prosthesis    Standing Unsupported with Eyes Closed Needs help to keep from falling  RW support requires minA    Standing Ubsupported with Feet Together Needs help to attain position and unable to hold for 15 seconds  unable with RW   From Standing, Reach Forward with Outstretched Arm Loses balance while trying/requires external support  RW support reaches 2" with supervision   From Standing Position, Pick up Object from Floor Unable to try/needs assist to keep balance  with RW support reaches to knee level   From Standing Position, Turn to Look Behind Over each Shoulder Needs assist to keep from losing balance and falling  RW support looks to sides only with supervision   Turn 360 Degrees Needs assistance while turning  RW support unable to perform turns   Standing Unsupported, Alternately Place Feet on Step/Stool Needs assistance to keep from falling or unable to try  RW support unable  to perform   Standing Unsupported, One Foot in [employer] balance while stepping or standing  RW unable to perform   Standing on One Leg Unable to try or needs assist to prevent fall   Total Score 8   Berg comment: <36/56 indicates dependency in ADLs & fall risk         Prosthetics Assessment - 08/15/16 1022      Prosthetics   Prosthetic Care Comments  --           Objective measurements completed on examination: See above findings.          [facility] Adult PT Treatment/Exercise - 08/15/16 1022      Prosthetics   Education Provided Skin check;Residual limb care;Prosthetic cleaning;Correct ply sock adjustment;Proper Donning;Proper wear schedule/adjustment   Person(s) Educated Patient   Education Method Explanation;Verbal cues   Education Method Verbalized understanding;Needs further instruction;Verbal cues required                PT Education - 08/15/16 1317    Education provided Yes   Education Details Clinical findings, goals for PT, pro bono PT clinics   Person(s) Educated Patient   Methods Explanation   Comprehension Verbalized understanding         PT Short Term Goals - 08/15/16 1806      PT SHORT TERM GOAL #1   Title Patient verbalizes proper cleaning and demonstrates proper donning modified independent. (Target Date: 4th visit after eval)   Baseline Patient requires skilled instruction for proper cleaning & ModA to tighten suspension strap in standing.    Time 4   Period Weeks   Status New     PT SHORT TERM GOAL #2   Title Patient tolerates prosthesis wear >/= 5 days /wk for >/= 8 hrs total /day.  (Target Date: 4th visit after eval)   Baseline Patient only wears prosthesis when leaving apt ~3 days/ wk for 1-2 hours.    Time 4   Period Weeks   Status New     PT SHORT TERM GOAL #3   Title Patient performs standing balance activities with RW & prosthesis reaching 7" and within 3" of floor with supervision.  (Target Date: 4th visit after eval)    Baseline Patient requires minA for standing activities with RW & Prosthesis reaching 5" and to knee level towards floor.    Time 4   Period Weeks   Status New     PT SHORT TERM GOAL #4  Title Patient ambulates 26' with RW & prosthesis with minA. (Target Date: 4th visit after eval)   Baseline Patient ambuates 3' with RW & prosthesis with modA requiring w/c to follow due to rapid decline & safety.    Time 4   Period Weeks   Status New         PT Long Term Goals - 08/15/16 1819      PT LONG TERM GOAL #1   Title Patient verbalizes & demonstrates proper prosthetic care to enable safe use of prosthesis. (Target Date: 8th visit after eval)   Baseline Patient is dependent in all aspects of prosthetic care.    Time 8   Period Weeks   Status New     PT LONG TERM GOAL #2   Title Patient tolerates daily wear of prosthesis >75% of awake hours without skin issues or limb pain to enable function throughout his day. (Target Date: 8th visit after eval)   Baseline Patient is wearing prosthesis only when he leaves apt ~3 of 7 days/wk for 1-2 hours.    Time 8   Period Weeks   Status New     PT LONG TERM GOAL #3   Title Patient performs standing balance activities with rolling walker support & prosthesis reaching 10" anteriorly & to floor, manage clothes modified independent.  (Target Date: 8th visit after eval)   Baseline Patient requires minA for standing activities with RW & Prosthesis reaching 5" and to knee level towards floor.    Time 8   Period Weeks   Status New     PT LONG TERM GOAL #4   Title Patient ambulates 100' with RW & prosthesis modified independent to enable basic community access. (Target Date: 8th visit after eval)   Baseline Patient ambuates 49' with RW & prosthesis with modA requiring w/c to follow due to rapid decline & safety.    Time 8   Period Weeks   Status New     PT LONG TERM GOAL #5   Title Patient negotiates ramp, curb with RW & stairs with 2 rails with  transfemoral prosthesis modified independent to enable community access.  (Target Date: 8th visit after eval)   Baseline Patient is dependent on level surface gait and unable to negotiate architectural barriers.    Time 8   Period Weeks   Status New                     Plan - 08/15/16 1752    Clinical Impression Statement This 41yo male underwent a Transfemoral Amputation (41660) on 02/19/2014. He recieved his first prosthesis Nov. 2017 and has limited instruction at [facility] rehab facility.  Patient was discharged from [facility] on 07/10/2016 and is living independently in a first floor apartment. He is wheelchair bound as he is not wearing prosthesis unless he has to leave his apartment. Standing & gait without prosthesis increases his fall risk & stress on non-amputated limb. He has limited prosthesis wear limiting function throughout his day of awake hours. Patient is dependent in proper prosthetic care which risks skin integrity & limb pain issues. His socket & liner appear to large due to normal limb volume decrease / shrinkage. PT set up appointment with prosthetist to check alignment and if ready for socket revision. Patient has impaired standing tolerance for 2 minutes with RW support and LLE fatigues. Patient has impaired balance without UE support on RW Berg Balance only 8/56 and with RW support reaches  5" with minA and to knee level with minA. Patient's gait is dependent with minA to [place] with excessive UE weight bearing on RW and gait deviations indicating poor control of prosthesis & fall risk. He only tolerates ambulation for 20' and requires w/c to stay behind him for safety & confidence. Patient would benefit from skilled PT to improve his mobility & safety with his prosthesis.    History and Personal Factors relevant to plan of care: Right TFA, Type 1 DM, HTN, chest pain, DVT/PE, nephrotic syndrome, morbid obesity, LBP   Clinical Presentation Evolving   Clinical Decision  Making Moderate   Rehab Potential Good   Clinical Impairments Affecting Rehab Potential visit limitation with Medicaid, Morbid obesity, length of time with immobility / w/c bound, lives alone.    PT Frequency 1x / week   PT Duration 8 weeks   PT Treatment/Interventions ADLs/Self Care Home Management;Gait training;DME Instruction;Stair training;Functional mobility training;Therapeutic activities;Therapeutic exercise;Balance training;Neuromuscular re-education;Patient/family education;Prosthetic Training   PT Next Visit Plan review prosthetic care, progressively increase wear, HEP at sink for midline   Recommended Other Services Needs referral to pro bono clinics at either [facility] or [place] PT programs.    Consulted and Agree with Plan of Care Patient      Patient will benefit from skilled therapeutic intervention in order to improve the following deficits and impairments:  Abnormal gait, Decreased activity tolerance, Decreased balance, Decreased endurance, Decreased knowledge of use of DME, Decreased mobility, Decreased range of motion, Decreased strength, Postural dysfunction, Prosthetic Dependency, Obesity, Pain  Visit Diagnosis: Other abnormalities of gait and mobility  Stiffness of right hip, not elsewhere classified  Muscle weakness (generalized)  Unsteadiness on feet  Amputation of lower extremity above knee with complication, right, sequela ([place])     Aug 18, 2016 1223  PT G-Codes  Functional Assessment Tool Used (Outpatient Only) Patient ambulates 20' with RW with excessive UE weight bearing with modA. Berg Balance 8/56  Functional Limitation Mobility: Walking and moving around  Mobility: Walking and Moving Around Current Status [phone]) CM  Mobility: Walking and Moving Around Goal Status ([id number]) CM     Problem List Patient Active Problem List   Diagnosis Date Noted  . History of pulmonary embolus (PE) 06/09/2016  . Normocytic anemia 01/30/2014  . Constipation   . Above knee  amputation of right lower extremity ([place]) 12/26/2013  . GERD (gastroesophageal reflux disease) 08/14/2013  . Hyperlipidemia 02/19/2013  . S/P cardiac cath, 02/18/13, normal coronaries 02/19/2013  . CKD (chronic kidney disease) stage 3, GFR 30-59 ml/min 02/19/2013  . Nephrotic syndrome 02/12/2012  . Essential hypertension 07/24/2011  . Long term current use of anticoagulant therapy 07/19/2011  . Back pain 07/12/2011  . History of DVT (deep vein thrombosis) 07/28/2009  . PROTEINURIA, MILD 02/01/2007  . Morbid obesity ([place]) 10/26/2005  . DM (diabetes mellitus) type I uncontrolled, periph vascular disorder ([place]) 01/09/2002    Rei Medlen  PT, DPT 08/16/2016, 12:32 PM  [facility] [address] [place], [place], [address] Phone: [phone]   Fax:  [phone]  Name: [name] MRN: [id number] Date of Birth: 07-Dec-1975

## 2016-08-17 ENCOUNTER — Ambulatory Visit: Payer: Medicaid Other | Admitting: Dietician

## 2016-08-17 ENCOUNTER — Ambulatory Visit (INDEPENDENT_AMBULATORY_CARE_PROVIDER_SITE_OTHER): Payer: Medicare Other | Admitting: Internal Medicine

## 2016-08-17 ENCOUNTER — Encounter: Payer: Self-pay | Admitting: Internal Medicine

## 2016-08-17 DIAGNOSIS — Z89611 Acquired absence of right leg above knee: Secondary | ICD-10-CM

## 2016-08-17 DIAGNOSIS — Z449 Encounter for fitting and adjustment of unspecified external prosthetic device: Secondary | ICD-10-CM | POA: Insufficient documentation

## 2016-08-17 DIAGNOSIS — I10 Essential (primary) hypertension: Secondary | ICD-10-CM | POA: Diagnosis present

## 2016-08-17 DIAGNOSIS — Z79899 Other long term (current) drug therapy: Secondary | ICD-10-CM

## 2016-08-17 HISTORY — DX: Encounter for fitting and adjustment of unspecified external prosthetic device: Z44.9

## 2016-08-17 LAB — GLUCOSE, CAPILLARY: GLUCOSE-CAPILLARY: 114 mg/dL — AB (ref 65–99)

## 2016-08-17 MED ORDER — CLONIDINE HCL 0.2 MG PO TABS: 0.2000 mg | ORAL_TABLET | Freq: Two times a day (BID) | ORAL | 11 refills | Status: DC

## 2016-08-17 MED ORDER — AMLODIPINE BESYLATE 5 MG PO TABS: 5.0000 mg | ORAL_TABLET | Freq: Every day | ORAL | 11 refills | Status: DC

## 2016-08-17 MED ORDER — ALPHA-LIPOIC ACID 200 MG PO CAPS: 200.0000 mg | ORAL_CAPSULE | Freq: Every day | ORAL | 2 refills | Status: DC

## 2016-08-17 MED ORDER — ZINC GLUCONATE 100 MG PO TABS: 220.0000 mg | ORAL_TABLET | Freq: Every day | ORAL | 2 refills | Status: DC

## 2016-08-17 NOTE — Assessment & Plan Note (Signed)
Assessment: Essential hypertension Patient was seen in the office on 08/03/2016 and blood pressure was elevated at 184/81 and on repeat was 169/83. I was concerned patient wasn't taking his medications as all his medications were switched from receiving them at his rehab facility to [facility].  He currently takes clonidine 0.1 mg twice a day, hydralazine 50 mg 4 times a day, Lasix 80 mg twice a day, metolazone 2.5mg  every other day and carvedilol 25mg  BID.  Today blood pressure is 158/79 and on repeat was 158/98. At this point will increase clonidine to 0.2 mg twice a day, discontinue hydralazine and add amlodipine 5 mg daily.  Patient will follow up in one month to reassess blood pressure medications.  Plan -Current blood pressure management includes continuing carvedilol 25 mg BID, Lasix 80mg  twice a day, metolazone 2.5 mg every other day,  increasing to clonidine 0.2mg  BID, and starting amlodipine 5mg  daily - follow up in one month - has blood pressure monitor at home, told to check his blood pressures daily and to call if they were elevated or too low

## 2016-08-17 NOTE — Patient Instructions (Signed)
Mr. Renstrom,  Please start taking clonidine 0.2 mg twice a day. Please start taking amlodipine 5 mg daily. Please stop taking hydralazine 50 mg 4 times a day. Please follow up in one month in the acute care clinic to reassess her blood pressure. In the meantime please will check your blood pressures at home if they run too high or too low please call the clinic as we may need to change her medications. A fax has been sent to [employer] for your prosthesis supplies.

## 2016-08-17 NOTE — Assessment & Plan Note (Signed)
Assessment: Prosthesis for right AKA from DVT   Patient's right AKA stump has decreased in size and his prosthesis is now loose.  There is concern that blisters can develop from improper fitting. Currently area is without blisters, clean and dry. Patient had his original prosthesis from [employer].  Called [employer] and was told to do a prescription for Prosthetic liners and supplies, and Socket replacement.  Plan -faxed over prescription to [facility] for prosthetic liners and supplies and socket replacement.

## 2016-08-17 NOTE — Progress Notes (Signed)
   CC: follow up on essential hypertension  HPI:  [name] is a 41 y.o. male with history noted below that presents to the Internal medicine clinic for follow up on essential hypertension.  Please see problem based charting for the status of patient's chronic medical conditions.  Past Medical History:  Diagnosis Date  . Acute venous embolism and thrombosis of deep vessels of proximal lower extremity ([place]) 07/19/2011  . Chest pain, neg MI, normal coronaries by cath 02/18/2013  . CKD (chronic kidney disease) stage 3, GFR 30-59 ml/min 02/19/2013  . Diabetic ulcer of right foot ([place])   . DVT (deep venous thrombosis) ([place]) 09/2002   patient reports additional DVTs in '06 & '11 (unconfirmed)  . GERD (gastroesophageal reflux disease)   . Hyperlipidemia 02/19/2013  . Hypertension   . Nephrotic syndrome   . Obesity    BMI 44, weight 346 pounds 01/30/14  . Pulmonary embolism ([place]) 09/2002   treated with 6 months of warfarin  . Type I diabetes mellitus ([place]) dx'd 2001    Review of Systems:  Review of Systems  Eyes: Negative for blurred vision.  Respiratory: Negative for shortness of breath.   Cardiovascular: Negative for chest pain.  Neurological: Negative for dizziness.     Physical Exam:  Vitals:   08/17/16 1317 08/17/16 1335  BP: (!) 158/79 (!) 158/98  Pulse: (!) 102   Temp: 98.9 F (37.2 C)   TempSrc: Oral   SpO2: 99%   Weight: (!) 402 lb 11.2 oz (182.7 kg)   Height: 6\' 2"  (1.88 m)    Physical Exam  Cardiovascular: Normal rate, regular rhythm and normal heart sounds.  Exam reveals no gallop and no friction rub.   No murmur heard. Pulmonary/Chest: Effort normal and breath sounds normal. No respiratory distress. He has no wheezes. He has no rales.  Musculoskeletal:  Right AKA, 1+ pitting edema in left extremity with stocking on   Skin: Skin is warm and dry. No rash noted. No erythema.    Assessment & Plan:   See encounters tab for problem based medical decision  making.    Patient discussed with Dr. Dareen Piano

## 2016-08-18 NOTE — Progress Notes (Signed)
Internal Medicine Clinic Attending  Case discussed with Dr. Hoffman at the time of the visit.  We reviewed the resident's history and exam and pertinent patient test results.  I agree with the assessment, diagnosis, and plan of care documented in the resident's note.  

## 2016-08-31 ENCOUNTER — Encounter: Payer: Self-pay | Admitting: Physician Assistant

## 2016-09-13 DIAGNOSIS — D631 Anemia in chronic kidney disease: Secondary | ICD-10-CM | POA: Diagnosis not present

## 2016-09-13 DIAGNOSIS — E1122 Type 2 diabetes mellitus with diabetic chronic kidney disease: Secondary | ICD-10-CM | POA: Diagnosis not present

## 2016-09-13 DIAGNOSIS — Z6841 Body Mass Index (BMI) 40.0 and over, adult: Secondary | ICD-10-CM | POA: Diagnosis not present

## 2016-09-13 DIAGNOSIS — Z86718 Personal history of other venous thrombosis and embolism: Secondary | ICD-10-CM | POA: Diagnosis not present

## 2016-09-13 DIAGNOSIS — Z794 Long term (current) use of insulin: Secondary | ICD-10-CM | POA: Diagnosis not present

## 2016-09-13 DIAGNOSIS — R109 Unspecified abdominal pain: Secondary | ICD-10-CM | POA: Diagnosis not present

## 2016-09-13 DIAGNOSIS — N2581 Secondary hyperparathyroidism of renal origin: Secondary | ICD-10-CM | POA: Diagnosis not present

## 2016-09-13 DIAGNOSIS — Z89619 Acquired absence of unspecified leg above knee: Secondary | ICD-10-CM | POA: Diagnosis not present

## 2016-09-13 DIAGNOSIS — N183 Chronic kidney disease, stage 3 (moderate): Secondary | ICD-10-CM | POA: Diagnosis not present

## 2016-09-13 DIAGNOSIS — E119 Type 2 diabetes mellitus without complications: Secondary | ICD-10-CM | POA: Diagnosis not present

## 2016-09-13 DIAGNOSIS — I10 Essential (primary) hypertension: Secondary | ICD-10-CM | POA: Diagnosis not present

## 2016-09-14 ENCOUNTER — Encounter: Payer: Self-pay | Admitting: Physician Assistant

## 2016-09-14 ENCOUNTER — Ambulatory Visit (INDEPENDENT_AMBULATORY_CARE_PROVIDER_SITE_OTHER): Payer: Medicare Other | Admitting: Physician Assistant

## 2016-09-14 VITALS — BP 136/70 | HR 96

## 2016-09-14 DIAGNOSIS — Z86010 Personal history of colonic polyps: Secondary | ICD-10-CM

## 2016-09-14 DIAGNOSIS — R1011 Right upper quadrant pain: Secondary | ICD-10-CM

## 2016-09-14 DIAGNOSIS — Z860101 Personal history of adenomatous and serrated colon polyps: Secondary | ICD-10-CM

## 2016-09-14 DIAGNOSIS — R1012 Left upper quadrant pain: Secondary | ICD-10-CM

## 2016-09-14 DIAGNOSIS — R933 Abnormal findings on diagnostic imaging of other parts of digestive tract: Secondary | ICD-10-CM

## 2016-09-14 DIAGNOSIS — G8929 Other chronic pain: Secondary | ICD-10-CM

## 2016-09-14 DIAGNOSIS — K760 Fatty (change of) liver, not elsewhere classified: Secondary | ICD-10-CM

## 2016-09-14 NOTE — Progress Notes (Signed)
Agree with assessment and plan as outlined.  

## 2016-09-14 NOTE — Progress Notes (Signed)
Subjective:    Patient ID: [name], male    DOB: 05/02/1975, 41 y.o.   MRN: [id number]  HPI  [name] is a pleasant 41 year old African-American male, new to GI today referred by Dr. Caren Griffins Dunham/nephrology for evaluation of complaints of abdominal pain and abnormal imaging. Patient has history of insulin-dependent diabetes mellitus, chronic kidney disease stage III, hypertension, morbid obesity, prior history of DVT and PE, maintained on eliquis. He is status post right BKA in 2015. By patient's report this was complicated by a blood clot and he eventually had an AKA in 2016. He had been living at a rehabilitation center in [place] for several months but is currently back at home in [place]. He says that he has been having abdominal pain over the past couple of years which had become more prominent over the past few months. Appendectomy and requested an abdominal ultrasound. He says he has been doing a lot of exercising, abdominal work to improve his core strength etc. as he hopes to be able to walk again soon. He has been noticing the abdominal pain primarily with doing abdominal crunches and when bending over or moving. He says he gets some pain across his upper abdomen and sometimes will have some pain across his lower abdomen both are pressure-like. He denies any difficulty with abdominal pain postprandially. He has been trying to lose weight and is avoiding greasy foods. He chronically has a lot of difficulty with gas. No nausea or vomiting. Bowel movements have been normal. He says occasionally he'll have looser stool and may have some fecal seepage. No complaints of dysphagia , no regular heartburn or indigestion other than with dietary indiscretion Patient did undergo colonoscopy in [place] per Dr. Lyda Jester in July 2017 due to change in bowel habits. This was a negative exam with the exception of  A 5 mm polyp which was removed and was a tubular adenoma. We have obtained copies of  recent imaging and he had abdominal ultrasound done June 2018 which showed an enlarged liver no changes suggesting parenchymal disorder CBD 6 mm echogenic material within the gallbladder gallstones not excluded prominent spleen. CT of the abdomen and pelvis without IV contrast had been done in February 2018 for complaints of upper and lower abdominal pain. This showed hepatic steatosis layering gallstones or sludge, no P or cholecystic inflammation, spleen read as normal in size, increased stool burden and mild distal esophageal wall thickening.  Review of Systems Pertinent positive and negative review of systems were noted in the above HPI section.  All other review of systems was otherwise negative.  Outpatient Encounter Prescriptions as of 09/14/2016  Medication Sig  . acetaminophen (TYLENOL) 325 MG tablet Take 1-2 tablets (325-650 mg total) by mouth every 4 (four) hours as needed for mild pain.  Marland Kitchen allopurinol (ZYLOPRIM) 100 MG tablet Take 2 tablets (200 mg total) by mouth daily.  . Alpha-Lipoic Acid 200 MG CAPS Take 1 capsule (200 mg total) by mouth daily.  Marland Kitchen amLODipine (NORVASC) 5 MG tablet Take 1 tablet (5 mg total) by mouth daily.  Marland Kitchen apixaban (ELIQUIS) 5 MG TABS tablet Take 1 tablet (5 mg total) by mouth 2 (two) times daily.  . Blood Glucose Monitoring Suppl (ACCU-CHEK AVIVA) device Use to check blood sugar 5 times daily. Diag code E10.51. Multiple insulin dependent  . calcitRIOL (ROCALTROL) 0.25 MCG capsule Take 1 capsule (0.25 mcg total) by mouth daily.  . carvedilol (COREG) 25 MG tablet Take 1 tablet (25 mg total) by mouth  2 (two) times daily.  . cloNIDine (CATAPRES) 0.2 MG tablet Take 1 tablet (0.2 mg total) by mouth 2 (two) times daily.  . furosemide (LASIX) 40 MG tablet Take 2 tablets (80 mg total) by mouth 2 (two) times daily.  Marland Kitchen gabapentin (NEURONTIN) 300 MG capsule Take 1 capsule (300 mg total) by mouth 3 (three) times daily.  Marland Kitchen glucose blood (ACCU-CHEK AVIVA) test strip Use to check  blood sugars 5 times a day. Dx code:E11.9. Insulin dependent.  . insulin aspart (NOVOLOG) 100 UNIT/ML FlexPen Inject 25 Units into the skin 3 (three) times daily.  . Insulin Degludec 200 UNIT/ML SOPN Inject 140 Units into the skin daily.  . Insulin Pen Needle (PEN NEEDLES 3/16") 31G X 5 MM MISC Use five times daily  . Lancet Devices (ACCU-CHEK SOFTCLIX) lancets Use to check blood sugars 5 times a day. Dx code:E11.9. Insulin dependent.  . linagliptin (TRADJENTA) 5 MG TABS tablet Take 1 tablet (5 mg total) by mouth daily.  Marland Kitchen loratadine (CLARITIN) 10 MG tablet Take 10 mg by mouth.  . lovastatin (MEVACOR) 20 MG tablet Take 1 tablet (20 mg total) by mouth daily.  . metolazone (ZAROXOLYN) 2.5 MG tablet Take 2.5 mg by mouth every other day.  . ondansetron (ZOFRAN) 4 MG tablet Take 4 mg by mouth every 6 (six) hours as needed.  . polyethylene glycol (MIRALAX / GLYCOLAX) packet Take by mouth.  . triamcinolone cream (KENALOG) 0.1 % Apply 1 application topically 2 (two) times daily.  . Zinc Gluconate 100 MG TABS Take 2.2 tablets (220 mg total) by mouth daily.  . [DISCONTINUED] potassium chloride SA (K-DUR,KLOR-CON) 20 MEQ tablet Take 1 tablet (20 mEq total) by mouth daily. (Patient not taking: Reported on 08/15/2016)  . [DISCONTINUED] testosterone cypionate (DEPOTESTOTERONE CYPIONATE) 100 MG/ML injection Inject 150 mg into the muscle every 14 (fourteen) days. For IM use only   No facility-administered encounter medications on file as of 09/14/2016.    No Known Allergies Patient Active Problem List   Diagnosis Date Noted  . Prosthesis adjustment 08/17/2016  . History of pulmonary embolus (PE) 06/09/2016  . Normocytic anemia 01/30/2014  . Constipation   . Above knee amputation of right lower extremity ([place]) 12/26/2013  . GERD (gastroesophageal reflux disease) 08/14/2013  . Hyperlipidemia 02/19/2013  . S/P cardiac cath, 02/18/13, normal coronaries 02/19/2013  . CKD (chronic kidney disease) stage 3, GFR  30-59 ml/min 02/19/2013  . Nephrotic syndrome 02/12/2012  . Essential hypertension 07/24/2011  . Long term current use of anticoagulant therapy 07/19/2011  . Back pain 07/12/2011  . History of DVT (deep vein thrombosis) 07/28/2009  . PROTEINURIA, MILD 02/01/2007  . Morbid obesity ([place]) 10/26/2005  . DM (diabetes mellitus) type I uncontrolled, periph vascular disorder ([place]) 01/09/2002   Social History   Social History  . Marital status: Single    Spouse name: N/A  . Number of children: 0  . Years of education: 15.5   Occupational History  . [employer]   . student   .  [employer]    Social History Main Topics  . Smoking status: Never Smoker  . Smokeless tobacco: Never Used  . Alcohol use No     Comment: 12/09/2013 "a few beers q 6 months or so"  . Drug use: No  . Sexual activity: No   Other Topics Concern  . Not on file   Social History Narrative   Financial assistance approved for 100% discount at [facility] and has [facility] card per [employer]  09/01/2009.      Lives in [place] with mother and sister.                Mr. Cisnero family history includes Diabetes in his cousin, maternal uncle, and mother; Heart disease in his mother.      Objective:    Vitals:   09/14/16 1046  BP: 136/70  Pulse: 96    Physical Exam  well-developed morbidly obese African-American male in no acute distress, he is status post right AKA and is wheelchair-bound. Pleasant, blood pressure 136/70 pulse 96, not weighed today HEENT; nontraumatic normocephalic EOMI PERRLA sclera anicteric, Cardiovascular; regular rate and rhythm with S1-S2 no murmur or gallop, Pulmonary ;clear bilaterally, Abdomen; massively obese, soft, there is no focal tenderness, no palpable mass or hepatosplenomegaly bowel sounds are present, Rectal ;exam not done, Extremities ;status post right AKA neuropsych mood and affect appropriate       Assessment & Plan:   #31 41 year old African-American male with 2 year  history of complaints of abdominal pain, more noticeable over the past several months with increase in physical activity and exercise. Pain seems to be brought on by bending over will do abdominal exercises and is not severe. I think is.abdominal pain is musculoskeletal in etiology, no evidence of abdominal wall hernias etc. by previous CT #2 history of tubular adenomatous colon polyp at colonoscopy in July 2017/Dr. Misenheimer will need interval 5 year follow-up #3 gallbladder sludge, possible small stones #4 distal esophageal wall thickening noted on previous CT-probably reflux esophagitis, rule out  Barrett's/neoplasm #5 hepatic steatosis with enlarged liver #6 mild splenomegaly #7 morbid obesity #8 chronic kidney disease stage III #9 prior history of DVT/PE-on eliquis #10 status post right AKA-/wheelchair-bound #11 insulin-dependent diabetes mellitus #12 hypertension  Plan; Plan discussed findings on imaging. His pain complaints are very atypical for biliary colic and much more consistent with musculoskeletal etiology. Patient will be scheduled for EGD with Dr. Havery Moros at [facility] due to morbid obesity and immobility. He may need biopsies and eliquis will be held for 48 hours prior to EGD. We will communicate with patient's primary care M.D. Dr. Kalman Shan D.O./[facility] to assure that this is reasonable for this patient. Patient will need follow-up colonoscopy and will be placed for recall for July 2022 with Dr. Havery Moros. We discussed initial management of fatty liver disease with tight diabetic control and weight loss.  [name] S Connie Hilgert PA-C 09/14/2016   Cc: Kalman Shan Ratlif*

## 2016-09-14 NOTE — Patient Instructions (Addendum)
You have been scheduled for an endoscopy. Please follow written instructions given to you at your visit today. If you use inhalers (even only as needed), please bring them with you on the day of your procedure.  We have put a Recall Colonoscopy in the system for 2022 you will be contacted around that time to schedule your colonoscopy.

## 2016-09-21 ENCOUNTER — Other Ambulatory Visit: Payer: Self-pay

## 2016-09-21 ENCOUNTER — Encounter: Payer: Self-pay | Admitting: Internal Medicine

## 2016-09-21 ENCOUNTER — Encounter: Payer: Medicaid Other | Admitting: Internal Medicine

## 2016-09-21 ENCOUNTER — Ambulatory Visit: Payer: Medicaid Other | Admitting: Dietician

## 2016-09-21 NOTE — Progress Notes (Deleted)
   CC: follow-up on essential hypertension  HPI:  [name] is a 41 y.o. male with history noted below the presents to the internal medicine clinic for follow-up on essential hypertension. Please see problem based charting for the status of patient's chronic medical condition.  Past Medical History:  Diagnosis Date  . Acute venous embolism and thrombosis of deep vessels of proximal lower extremity ([place]) 07/19/2011  . Anemia   . Chest pain, neg MI, normal coronaries by cath 02/18/2013  . CKD (chronic kidney disease) stage 3, GFR 30-59 ml/min 02/19/2013  . Colon polyps   . Diabetic ulcer of right foot ([place])   . DVT (deep venous thrombosis) ([place]) 09/2002   patient reports additional DVTs in '06 & '11 (unconfirmed)  . GERD (gastroesophageal reflux disease)   . Hyperlipidemia 02/19/2013  . Hypertension   . Nephrotic syndrome   . Obesity    BMI 44, weight 346 pounds 01/30/14  . Pulmonary embolism ([place]) 09/2002   treated with 6 months of warfarin  . Type I diabetes mellitus ([place]) dx'd 2001    Review of Systems:  ***  Physical Exam:  There were no vitals filed for this visit. ***  Assessment & Plan:   See encounters tab for problem based medical decision making.  Assessment: Essential hypertension On 08/2016 patient's blood pressure continued to be elevated at 158/98.  At that visit he was taking clonidine 0.1 mg twice a day, hydralazine 50 mg 4 times a day, Lasix 80 mg twice a day, metolazone 2.5 mg every other day and carvedilol 25 mg twice a day.  Changes were made at the visit and clonidine was increased to 0.2 mg twice a day, adding amlodipine 5 mg daily and discontinuing hydralazine.  Today's blood pressure is ***  Plan  Flu vaccine  Eye exam Foot exam Urine microalbumin   Patient {GC/GE:3044014::"discussed with","seen with"} Dr. {NAMES:[id number]::"Butcher","Granfortuna","E. Brittane Grudzinski","Klima","Mullen","Narendra","Vincent"}

## 2016-09-29 ENCOUNTER — Telehealth: Payer: Self-pay | Admitting: Dietician

## 2016-09-29 NOTE — Telephone Encounter (Signed)
Called patient about overdue eye exam:  He says he had an eye exam by optometrist while in rehab. He does not know the results of that exam. He agrees to eye exam referral. Had retinopathy per our records in 2014.  He asked to reschedule appointments. His call was transferred to the front office.

## 2016-10-03 ENCOUNTER — Encounter: Payer: Medicare Other | Admitting: Dietician

## 2016-10-03 ENCOUNTER — Telehealth: Payer: Self-pay | Admitting: Dietician

## 2016-10-13 NOTE — Telephone Encounter (Signed)
Called to follow up with diabetes self-management. He is working out 5 days a week x 2 weeks now, has a Physiological scientist 3 days a week. Wants information on "Clean eating" for diabetes. He says his "sugar is dropping" with workouts. Lowest blood sugar has ben 52. He make sure to eat something after workouts. (only had diet gatorade the day he had the low blood sugar)  He doesn't think he needs an adjustment in insulin until he documents his blood sugar better before and after workouts. In addition, he requests food pantry/mobile food market information. Appointment rescheduled with dietitian/ diabetes educator for 10/24/16.  Mailed requested information.

## 2016-10-19 ENCOUNTER — Telehealth: Payer: Self-pay | Admitting: Internal Medicine

## 2016-10-19 NOTE — Telephone Encounter (Signed)
Patient requesting a referral to the [facility] for PT.  Please Advise.

## 2016-10-19 NOTE — Therapy (Signed)
[facility] [place], [place], [address] Phone: [phone]   Fax:  [phone]  Patient Details  Name: [name] MRN: [id number] Date of Birth: 1975/02/12 Referring Provider: Kalman Shan, MD   Encounter Date: 08/25/2016  PHYSICAL THERAPY DISCHARGE SUMMARY  Visits from Start of Care: 1  Current functional level related to goals / functional outcomes: Medicaid would not authorize PT treatments.    Remaining deficits: Same as evaluation   Education / Equipment: eval only Plan: Patient agrees to discharge.  Patient goals were not met. Patient is being discharged due to financial reasons.  ?????        Mansel Strother PT, DPT 10/19/2016, 1:30 PM  [facility] [address] [place], [place], [address] Phone: [phone]   Fax:  [phone]

## 2016-10-22 ENCOUNTER — Other Ambulatory Visit: Payer: Self-pay | Admitting: Internal Medicine

## 2016-10-22 DIAGNOSIS — S78111A Complete traumatic amputation at level between right hip and knee, initial encounter: Secondary | ICD-10-CM

## 2016-10-22 NOTE — Telephone Encounter (Signed)
Order placed

## 2016-10-24 ENCOUNTER — Ambulatory Visit (HOSPITAL_COMMUNITY)
Admission: EM | Admit: 2016-10-24 | Discharge: 2016-10-24 | Disposition: A | Discharge status: Home / Self Care | Payer: Medicare Other | Attending: Internal Medicine | Admitting: Internal Medicine

## 2016-10-24 ENCOUNTER — Emergency Department (HOSPITAL_COMMUNITY)
Admission: EM | Admit: 2016-10-24 | Discharge: 2016-10-25 | Disposition: A | Discharge status: Home / Self Care | Payer: Medicare Other | Attending: Emergency Medicine | Admitting: Emergency Medicine

## 2016-10-24 ENCOUNTER — Ambulatory Visit: Payer: Medicare Other | Admitting: Dietician

## 2016-10-24 ENCOUNTER — Emergency Department (HOSPITAL_COMMUNITY): Payer: Medicare Other

## 2016-10-24 ENCOUNTER — Encounter (HOSPITAL_COMMUNITY): Payer: Self-pay | Admitting: Emergency Medicine

## 2016-10-24 DIAGNOSIS — R112 Nausea with vomiting, unspecified: Secondary | ICD-10-CM

## 2016-10-24 DIAGNOSIS — K8020 Calculus of gallbladder without cholecystitis without obstruction: Secondary | ICD-10-CM | POA: Diagnosis not present

## 2016-10-24 DIAGNOSIS — R05 Cough: Secondary | ICD-10-CM | POA: Diagnosis not present

## 2016-10-24 DIAGNOSIS — R10816 Epigastric abdominal tenderness: Secondary | ICD-10-CM

## 2016-10-24 DIAGNOSIS — E1022 Type 1 diabetes mellitus with diabetic chronic kidney disease: Secondary | ICD-10-CM | POA: Diagnosis not present

## 2016-10-24 DIAGNOSIS — R109 Unspecified abdominal pain: Secondary | ICD-10-CM | POA: Diagnosis not present

## 2016-10-24 DIAGNOSIS — Z7901 Long term (current) use of anticoagulants: Secondary | ICD-10-CM | POA: Diagnosis not present

## 2016-10-24 DIAGNOSIS — N183 Chronic kidney disease, stage 3 (moderate): Secondary | ICD-10-CM | POA: Diagnosis not present

## 2016-10-24 DIAGNOSIS — R197 Diarrhea, unspecified: Secondary | ICD-10-CM

## 2016-10-24 DIAGNOSIS — I129 Hypertensive chronic kidney disease with stage 1 through stage 4 chronic kidney disease, or unspecified chronic kidney disease: Secondary | ICD-10-CM | POA: Diagnosis not present

## 2016-10-24 DIAGNOSIS — R1110 Vomiting, unspecified: Secondary | ICD-10-CM

## 2016-10-24 DIAGNOSIS — Z79899 Other long term (current) drug therapy: Secondary | ICD-10-CM | POA: Insufficient documentation

## 2016-10-24 LAB — GLUCOSE, CAPILLARY: GLUCOSE-CAPILLARY: 138 mg/dL — AB (ref 65–99)

## 2016-10-24 LAB — CBC
HEMATOCRIT: 37.2 % — AB (ref 39.0–52.0)
Hemoglobin: 12 g/dL — ABNORMAL LOW (ref 13.0–17.0)
MCH: 28.6 pg (ref 26.0–34.0)
MCHC: 32.3 g/dL (ref 30.0–36.0)
MCV: 88.8 fL (ref 78.0–100.0)
PLATELETS: 314 10*3/uL (ref 150–400)
RBC: 4.19 MIL/uL — ABNORMAL LOW (ref 4.22–5.81)
RDW: 14 % (ref 11.5–15.5)
WBC: 13.3 10*3/uL — AB (ref 4.0–10.5)

## 2016-10-24 LAB — COMPREHENSIVE METABOLIC PANEL
ALT: 23 U/L (ref 17–63)
AST: 23 U/L (ref 15–41)
Albumin: 3.3 g/dL — ABNORMAL LOW (ref 3.5–5.0)
Alkaline Phosphatase: 147 U/L — ABNORMAL HIGH (ref 38–126)
Anion gap: 8 (ref 5–15)
BUN: 32 mg/dL — AB (ref 6–20)
CHLORIDE: 104 mmol/L (ref 101–111)
CO2: 26 mmol/L (ref 22–32)
CREATININE: 2.08 mg/dL — AB (ref 0.61–1.24)
Calcium: 8.9 mg/dL (ref 8.9–10.3)
GFR calc Af Amer: 45 mL/min — ABNORMAL LOW (ref >60)
GFR calc non Af Amer: 38 mL/min — ABNORMAL LOW (ref >60)
Glucose, Bld: 135 mg/dL — ABNORMAL HIGH (ref 65–99)
POTASSIUM: 4.1 mmol/L (ref 3.5–5.1)
SODIUM: 138 mmol/L (ref 135–145)
Total Bilirubin: 0.5 mg/dL (ref 0.3–1.2)
Total Protein: 7.8 g/dL (ref 6.5–8.1)

## 2016-10-24 LAB — URINALYSIS, ROUTINE W REFLEX MICROSCOPIC
BILIRUBIN URINE: NEGATIVE
Glucose, UA: NEGATIVE mg/dL
Hgb urine dipstick: NEGATIVE
KETONES UR: NEGATIVE mg/dL
LEUKOCYTES UA: NEGATIVE
Nitrite: NEGATIVE
PH: 6 (ref 5.0–8.0)
Protein, ur: >=300 mg/dL — AB
Specific Gravity, Urine: 1.017 (ref 1.005–1.030)

## 2016-10-24 LAB — LIPASE, BLOOD: LIPASE: 32 U/L (ref 11–51)

## 2016-10-24 MED ORDER — SODIUM CHLORIDE 0.9 % IV BOLUS (SEPSIS)
1000.0000 mL | Freq: Once | INTRAVENOUS | Status: AC
  Administered 2016-10-25: 1000 mL via INTRAVENOUS

## 2016-10-24 MED ORDER — ONDANSETRON HCL 4 MG/2ML IJ SOLN
4.0000 mg | Freq: Once | INTRAMUSCULAR | Status: AC
  Administered 2016-10-25: 4 mg via INTRAVENOUS
  Filled 2016-10-24: qty 2

## 2016-10-24 MED ORDER — ONDANSETRON 4 MG PO TBDP
4.0000 mg | ORAL_TABLET | Freq: Once | ORAL | Status: AC | PRN
  Administered 2016-10-24: 4 mg via ORAL

## 2016-10-24 MED ORDER — MORPHINE SULFATE (PF) 4 MG/ML IV SOLN
4.0000 mg | Freq: Once | INTRAVENOUS | Status: AC
  Administered 2016-10-25: 4 mg via INTRAVENOUS
  Filled 2016-10-24: qty 1

## 2016-10-24 MED ORDER — ONDANSETRON 4 MG PO TBDP
ORAL_TABLET | ORAL | Status: AC
  Filled 2016-10-24: qty 1

## 2016-10-24 NOTE — ED Notes (Signed)
Unable to establish peripheral IV despite several attempts .

## 2016-10-24 NOTE — ED Triage Notes (Signed)
Pt here for cough and N/V x 3 days

## 2016-10-24 NOTE — ED Provider Notes (Signed)
10/24/2016 7:28 PM   DOB: 07-26-1975 / MRN: [id number]  SUBJECTIVE:  [name] is a 42 y.o. male presenting for a multitude of symptoms that include nausea, vomitting, cough with shortness of breath and achy chest pain. The nausea and emesis started started about 3 days ago and he associates generalized abdominal pain with the nausea and vomiting. (he is seen throwing up in the exam room.) Denies a history of pancreatitis. Denies radicular pattern to the pain.  Denies fever.  He has a history of type 1 diabetes well controlled on his last A1c.  He does not really drink.  He tells me he still has his gall bladder.    Has been off of his BP medication since Sunday because anything he takes comes right back up.   He has No Known Allergies.   He  has a past medical history of Acute venous embolism and thrombosis of deep vessels of proximal lower extremity ([place]) (07/19/2011); Anemia; Chest pain, neg MI, normal coronaries by cath (02/18/2013); CKD (chronic kidney disease) stage 3, GFR 30-59 ml/min ([facility]) (02/19/2013); Colon polyps; Diabetic ulcer of right foot ([place]); DVT (deep venous thrombosis) ([place]) (09/2002); GERD (gastroesophageal reflux disease); Hyperlipidemia (02/19/2013); Hypertension; Nephrotic syndrome; Obesity; Pulmonary embolism ([place]) (09/2002); and Type I diabetes mellitus ([place]) (dx'd 2001).    He  reports that he has never smoked. He has never used smokeless tobacco. He reports that he does not drink alcohol or use drugs. He  reports that he does not engage in sexual activity. The patient  has a past surgical history that includes Incision and drainage abscess (2007; 2015); Cardiac catheterization (02/18/2013); I&D extremity (Right, 12/14/2013); left heart catheterization with coronary angiogram (N/A, 02/18/2013); Amputation (Right, 12/22/2013); Incision and drainage of wound (Right, 12/22/2013); and Amputation (Right, 02/19/2014).  His family history includes Diabetes in his cousin, maternal uncle, and  mother; Heart disease in his mother.  Review of Systems  Constitutional: Negative for chills, diaphoresis and fever.  Respiratory: Positive for cough. Negative for hemoptysis, sputum production, shortness of breath and wheezing.   Cardiovascular: Negative for chest pain, orthopnea and leg swelling.  Gastrointestinal: Negative for abdominal pain, blood in stool, constipation, diarrhea, heartburn, melena, nausea and vomiting.  Genitourinary: Negative for flank pain.  Skin: Negative for rash.  Neurological: Negative for dizziness.    OBJECTIVE:  BP (!) 215/98 (BP Location: Right Arm)   Pulse 100   Temp 98.3 F (36.8 C) (Oral)   Resp 18   SpO2 97%   Physical Exam  Constitutional: He is oriented to person, place, and time. He appears well-developed. He is active and cooperative.  Non-toxic appearance.  Cardiovascular: Normal rate, regular rhythm, S1 normal, S2 normal, normal heart sounds, intact distal pulses and normal pulses.  Exam reveals no gallop and no friction rub.   No murmur heard. Pulmonary/Chest: Effort normal. No stridor. No tachypnea. No respiratory distress. He has no wheezes. He has no rales.  Abdominal: Soft. He exhibits no distension and no mass. There is tenderness (epigastric, borderline exquisite). There is no rebound and no guarding.  Musculoskeletal: He exhibits no edema.  Neurological: He is alert and oriented to person, place, and time.  Skin: Skin is warm and dry. No rash (no ecchymosis about the abdomen) noted. He is not diaphoretic. No pallor.  Vitals reviewed.   Results for orders placed or performed during the hospital encounter of 10/24/16 (from the past 72 hour(s))  Glucose, capillary     Status: Abnormal   Collection Time:  10/24/16  6:52 PM  Result Value Ref Range   Glucose-Capillary 138 (H) 65 - 99 mg/dL   Lab Results  Component Value Date   HGBA1C 6.2 08/03/2016   Lab Results  Component Value Date   CREATININE 1.92 (H) 08/03/2016   BUN 43 (H)  08/03/2016   NA 141 08/03/2016   K 4.5 08/03/2016   CL 105 08/03/2016   CO2 21 08/03/2016   Lab Results  Component Value Date   WBC 8.4 08/03/2016   HGB 10.6 (L) 08/03/2016   HCT 33.2 (L) 08/03/2016   MCV 88 08/03/2016   PLT 276 08/03/2016   Lab Results  Component Value Date   CHOL 94 02/19/2014   HDL 23 (L) 02/19/2014   LDLCALC 53 02/19/2014   TRIG 91 02/19/2014   CHOLHDL 4.1 02/19/2014       No results found.  ASSESSMENT AND PLAN:  The primary encounter diagnosis was Epigastric abdominal tenderness, rebound tenderness presence not specified. A diagnosis of Nausea vomiting and diarrhea was also pertinent to this visit.   Chronically ill gentleman here today off of his blood pressure medication with epigastric tenderness and seen vomiting in the room.  I am worried that he has pancreatitis and he is negative for fever due to a long a poorly controlled history of diabetes (he is status post right sided BKA. I have advisaed that he go to the ED where he can be properly labbed and imaged as needed.    The patient is advised to call or return to clinic if he does not see an improvement in symptoms, or to seek the care of the closest emergency department if he worsens with the above plan.   Philis Fendt, MHS, PA-C 10/24/2016 7:28 PM   Tereasa Coop, PA-C 10/24/16 1931

## 2016-10-24 NOTE — ED Triage Notes (Signed)
Patient with history of nausea, vomiting and abdominal pain for over a week.  Patient states that he has not been able to take any of his meds in the last week or so due to this.  Patient also having a cough and congestion.

## 2016-10-24 NOTE — ED Provider Notes (Signed)
[facility] EMERGENCY DEPARTMENT Provider Note   CSN: [id number] Arrival date & time: 10/24/16  1943     History   Chief Complaint Chief Complaint  Patient presents with  . Emesis  . Nausea    HPI [name] is a 40 y.o. male.  Patient presents to the emergency department with chief complaint of nausea, vomiting, and diarrhea. He states that his symptoms started 5 days ago. He denies any associated fevers chills. He states that he is been unable to keep anything down. He denies any sick contacts, but states that his girlfriend does work at the hospital, so he may have been exposed to something. He denies having taken anything for symptoms apart from some cough medicine. He states that he has not been able take his regular medications because of his vomiting. He states that he has some pain in his upper left and lower left abdomen. The symptoms are worsened with palpation. He denies seeing any blood in his vomit or stool.   The history is provided by the patient. No language interpreter was used.    Past Medical History:  Diagnosis Date  . Acute venous embolism and thrombosis of deep vessels of proximal lower extremity ([place]) 07/19/2011  . Anemia   . Chest pain, neg MI, normal coronaries by cath 02/18/2013  . CKD (chronic kidney disease) stage 3, GFR 30-59 ml/min (HCC) 02/19/2013  . Colon polyps   . Diabetic ulcer of right foot ([place])   . DVT (deep venous thrombosis) ([place]) 09/2002   patient reports additional DVTs in '06 & '11 (unconfirmed)  . GERD (gastroesophageal reflux disease)   . Hyperlipidemia 02/19/2013  . Hypertension   . Nephrotic syndrome   . Obesity    BMI 44, weight 346 pounds 01/30/14  . Pulmonary embolism ([place]) 09/2002   treated with 6 months of warfarin  . Type I diabetes mellitus ([place]) dx'd 2001    Patient Active Problem List   Diagnosis Date Noted  . Prosthesis adjustment 08/17/2016  . History of pulmonary embolus (PE) 06/09/2016  .  Normocytic anemia 01/30/2014  . Constipation   . Above knee amputation of right lower extremity ([place]) 12/26/2013  . GERD (gastroesophageal reflux disease) 08/14/2013  . Hyperlipidemia 02/19/2013  . S/P cardiac cath, 02/18/13, normal coronaries 02/19/2013  . CKD (chronic kidney disease) stage 3, GFR 30-59 ml/min (HCC) 02/19/2013  . Nephrotic syndrome 02/12/2012  . Essential hypertension 07/24/2011  . Long term current use of anticoagulant therapy 07/19/2011  . Back pain 07/12/2011  . History of DVT (deep vein thrombosis) 07/28/2009  . PROTEINURIA, MILD 02/01/2007  . Morbid obesity ([place]) 10/26/2005  . DM (diabetes mellitus) type I uncontrolled, periph vascular disorder ([place]) 01/09/2002    Past Surgical History:  Procedure Laterality Date  . AMPUTATION Right 12/22/2013   Procedure: AMPUTATION BELOW KNEE;  Surgeon: [name], MD;  Location: [facility];  Service: Orthopedics;  Laterality: Right;  . AMPUTATION Right 02/19/2014   Procedure: RIGHT ABOVE KNEE AMPUTATION ;  Surgeon: [name], MD;  Location: [facility];  Service: Orthopedics;  Laterality: Right;  . CARDIAC CATHETERIZATION  02/18/2013   normal coronaries  . I&D EXTREMITY Right 12/14/2013   Procedure: IRRIGATION AND DEBRIDEMENT RIGHT FOOT;  Surgeon: [name], MD;  Location: [facility];  Service: Orthopedics;  Laterality: Right;  . INCISION AND DRAINAGE ABSCESS  2007; 2015   "back"  . INCISION AND DRAINAGE OF WOUND Right 12/22/2013   Procedure: I&D RIGHT BUTTOCK;  Surgeon: Jenny Reichmann  Doran Durand, MD;  Location: [facility];  Service: Orthopedics;  Laterality: Right;  . LEFT HEART CATHETERIZATION WITH CORONARY ANGIOGRAM N/A 02/18/2013   Procedure: LEFT HEART CATHETERIZATION WITH CORONARY ANGIOGRAM;  Surgeon: [name], MD;  Location: [facility];  Service: Cardiovascular;  Laterality: N/A;       Home Medications    Prior to Admission medications   Medication Sig Start Date End Date Taking? Authorizing Provider  acetaminophen (TYLENOL) 325 MG  tablet Take 1-2 tablets (325-650 mg total) by mouth every 4 (four) hours as needed for mild pain. 01/20/14   Love, Ivan Anchors, PA-C  allopurinol (ZYLOPRIM) 100 MG tablet Take 2 tablets (200 mg total) by mouth daily. 08/03/16   Kalman Shan Ratliff, DO  Alpha-Lipoic Acid 200 MG CAPS Take 1 capsule (200 mg total) by mouth daily. 08/17/16   Kalman Shan Ratliff, DO  amLODipine (NORVASC) 5 MG tablet Take 1 tablet (5 mg total) by mouth daily. 08/17/16 08/17/17  Kalman Shan Ratliff, DO  apixaban (ELIQUIS) 5 MG TABS tablet Take 1 tablet (5 mg total) by mouth 2 (two) times daily. 08/11/16   Axel Filler, MD  Blood Glucose Monitoring Suppl (ACCU-CHEK AVIVA) device Use to check blood sugar 5 times daily. Diag code E10.51. Multiple insulin dependent 08/11/16 08/11/17  Axel Filler, MD  calcitRIOL (ROCALTROL) 0.25 MCG capsule Take 1 capsule (0.25 mcg total) by mouth daily. 08/11/16   Axel Filler, MD  carvedilol (COREG) 25 MG tablet Take 1 tablet (25 mg total) by mouth 2 (two) times daily. 08/11/16 08/11/17  Axel Filler, MD  cloNIDine (CATAPRES) 0.2 MG tablet Take 1 tablet (0.2 mg total) by mouth 2 (two) times daily. 08/17/16 08/17/17  Kalman Shan Ratliff, DO  furosemide (LASIX) 40 MG tablet Take 2 tablets (80 mg total) by mouth 2 (two) times daily. 08/11/16   Axel Filler, MD  gabapentin (NEURONTIN) 300 MG capsule Take 1 capsule (300 mg total) by mouth 3 (three) times daily. 08/11/16   Axel Filler, MD  glucose blood (ACCU-CHEK AVIVA) test strip Use to check blood sugars 5 times a day. Dx code:E11.9. Insulin dependent. 08/14/16   Kalman Shan Ratliff, DO  insulin aspart (NOVOLOG) 100 UNIT/ML FlexPen Inject 25 Units into the skin 3 (three) times daily. 08/11/16 08/11/17  Axel Filler, MD  Insulin Degludec 200 UNIT/ML SOPN Inject 140 Units into the skin daily. 08/11/16   Axel Filler, MD  Insulin Pen Needle (PEN NEEDLES 3/16") 31G X 5 MM MISC Use five  times daily 08/14/16   [name], DO  Lancet Devices [facility]) lancets Use to check blood sugars 5 times a day. Dx code:E11.9. Insulin dependent. 08/14/16   Kalman Shan Ratliff, DO  linagliptin (TRADJENTA) 5 MG TABS tablet Take 1 tablet (5 mg total) by mouth daily. 08/11/16   Axel Filler, MD  loratadine (CLARITIN) 10 MG tablet Take 10 mg by mouth.    [provider]  lovastatin (MEVACOR) 20 MG tablet Take 1 tablet (20 mg total) by mouth daily. 06/08/16 06/08/17  Velna Ochs, MD  metolazone (ZAROXOLYN) 2.5 MG tablet Take 2.5 mg by mouth every other day.    [provider]  ondansetron (ZOFRAN) 4 MG tablet Take 4 mg by mouth every 6 (six) hours as needed.    [provider]  polyethylene glycol (MIRALAX / GLYCOLAX) packet Take by mouth.    [provider]  triamcinolone cream (KENALOG) 0.1 % Apply 1 application topically 2 (  two) times daily.    [provider]  Zinc Gluconate 100 MG TABS Take 2.2 tablets (220 mg total) by mouth daily. 08/17/16   [name], DO    Family History Family History  Problem Relation Age of Onset  . Heart disease Mother   . Diabetes Mother   . Diabetes Maternal Uncle   . Diabetes Cousin     Social History Social History  Substance Use Topics  . Smoking status: Never Smoker  . Smokeless tobacco: Never Used  . Alcohol use No     Comment: 12/09/2013 "a few beers q 6 months or so"     Allergies   Patient has no known allergies.   Review of Systems Review of Systems  All other systems reviewed and are negative.    Physical Exam Updated Vital Signs BP (!) 181/92 (BP Location: Right Arm)   Pulse (!) 108   Temp 99.1 F (37.3 C) (Oral)   Resp 18   Ht 6\' 2"  (1.88 m)   Wt (!) 172.4 kg (380 lb)   SpO2 96%   BMI 48.79 kg/m   Physical Exam  Constitutional: He is oriented to person, place, and time. He appears well-developed and well-nourished.  HENT:    Head: Normocephalic and atraumatic.  Eyes: Pupils are equal, round, and reactive to light. Conjunctivae and EOM are normal. Right eye exhibits no discharge. Left eye exhibits no discharge. No scleral icterus.  Neck: Normal range of motion. Neck supple. No JVD present.  Cardiovascular: Normal rate, regular rhythm and normal heart sounds.  Exam reveals no gallop and no friction rub.   No murmur heard. Pulmonary/Chest: Effort normal and breath sounds normal. No respiratory distress. He has no wheezes. He has no rales. He exhibits no tenderness.  Abdominal: Soft. He exhibits no distension and no mass. There is tenderness. There is no rebound and no guarding.  Left upper and lower abdominal tenderness  Musculoskeletal: Normal range of motion. He exhibits no edema or tenderness.  Neurological: He is alert and oriented to person, place, and time.  Skin: Skin is warm and dry.  Psychiatric: He has a normal mood and affect. His behavior is normal. Judgment and thought content normal.  Nursing note and vitals reviewed.    ED Treatments / Results  Labs (all labs ordered are listed, but only abnormal results are displayed) Labs Reviewed  COMPREHENSIVE METABOLIC PANEL - Abnormal; Notable for the following:       Result Value   Glucose, Bld 135 (*)    BUN 32 (*)    Creatinine, Ser 2.08 (*)    Albumin 3.3 (*)    Alkaline Phosphatase 147 (*)    GFR calc non Af Amer 38 (*)    GFR calc Af Amer 45 (*)    All other components within normal limits  CBC - Abnormal; Notable for the following:    WBC 13.3 (*)    RBC 4.19 (*)    Hemoglobin 12.0 (*)    HCT 37.2 (*)    All other components within normal limits  URINALYSIS, ROUTINE W REFLEX MICROSCOPIC - Abnormal; Notable for the following:    Protein, ur >=300 (*)    Bacteria, UA RARE (*)    Squamous Epithelial / LPF 0-5 (*)    All other components within normal limits  LIPASE, BLOOD  CBG MONITORING, ED    EKG  EKG Interpretation None        Radiology No results found.  Procedures  Procedures (including critical care time)  Medications Ordered in ED Medications  sodium chloride 0.9 % bolus 1,000 mL (not administered)  ondansetron (ZOFRAN) injection 4 mg (not administered)  morphine 4 MG/ML injection 4 mg (not administered)  ondansetron (ZOFRAN-ODT) disintegrating tablet 4 mg (4 mg Oral Given 10/24/16 2013)     Initial Impression / Assessment and Plan / ED Course  I have reviewed the triage vital signs and the nursing notes.  Pertinent labs & imaging results that were available during my care of the patient were reviewed by me and considered in my medical decision making (see chart for details).     Patient with n/v/d x 5 days. He does have some significant LLQ abdominal tenderness and pain.  He may need imaging to assess for diverticulitis.  Will give fluids, treat symptoms, and obtain imaging.  Labs are thus far fairly reassuring.  Patient is noted to by hypertensive, I attribute this to him not having had his meds 2/2 vomiting.    Patient now tolerating oral intake. CT scan is reassuring, no evidence of diverticulitis.  Will discharge home with pain and nausea medicine. Return precautions discussed. Patient understands and agrees to plan. He is stable for discharge.  Final Clinical Impressions(s) / ED Diagnoses   Final diagnoses:  Nausea vomiting and diarrhea    New Prescriptions Discharge Medication List as of 10/25/2016  3:29 AM    START taking these medications   Details  oxyCODONE-acetaminophen (PERCOCET/ROXICET) 5-325 MG tablet Take 1-2 tablets by mouth every 6 (six) hours as needed for severe pain., Starting Wed 10/25/2016, Print    promethazine (PHENERGAN) 25 MG tablet Take 1 tablet (25 mg total) by mouth every 6 (six) hours as needed for nausea or vomiting., Starting Wed 10/25/2016, Print         Montine Circle, PA-C 10/25/16 [id number]    Ward, Delice Bison, DO 10/25/16 [id number]

## 2016-10-24 NOTE — Telephone Encounter (Signed)
Thanks

## 2016-10-24 NOTE — Discharge Instructions (Signed)
Go directly to the ED

## 2016-10-25 ENCOUNTER — Ambulatory Visit: Payer: Medicare Other

## 2016-10-25 DIAGNOSIS — R112 Nausea with vomiting, unspecified: Secondary | ICD-10-CM | POA: Diagnosis not present

## 2016-10-25 LAB — CBG MONITORING, ED
GLUCOSE-CAPILLARY: 110 mg/dL — AB (ref 65–99)
Glucose-Capillary: 126 mg/dL — ABNORMAL HIGH (ref 65–99)

## 2016-10-25 MED ORDER — SODIUM CHLORIDE 0.9 % IV BOLUS (SEPSIS)
1000.0000 mL | Freq: Once | INTRAVENOUS | Status: AC
  Administered 2016-10-25: 1000 mL via INTRAVENOUS

## 2016-10-25 MED ORDER — OXYCODONE-ACETAMINOPHEN 5-325 MG PO TABS: 1.0000 | ORAL_TABLET | Freq: Four times a day (QID) | ORAL | 0 refills | Status: DC | PRN

## 2016-10-25 MED ORDER — PROMETHAZINE HCL 25 MG PO TABS: 25.0000 mg | ORAL_TABLET | Freq: Four times a day (QID) | ORAL | 0 refills | Status: DC | PRN

## 2016-10-25 MED ORDER — HYDROMORPHONE HCL 1 MG/ML IJ SOLN
1.0000 mg | Freq: Once | INTRAMUSCULAR | Status: AC
  Administered 2016-10-25: 1 mg via INTRAVENOUS
  Filled 2016-10-25: qty 1

## 2016-10-25 MED ORDER — PROMETHAZINE HCL 25 MG/ML IJ SOLN
25.0000 mg | Freq: Once | INTRAMUSCULAR | Status: AC
  Administered 2016-10-25: 25 mg via INTRAVENOUS
  Filled 2016-10-25: qty 1

## 2016-10-27 ENCOUNTER — Ambulatory Visit: Payer: Medicare Other

## 2016-10-27 DIAGNOSIS — R197 Diarrhea, unspecified: Secondary | ICD-10-CM | POA: Diagnosis not present

## 2016-10-27 DIAGNOSIS — D6489 Other specified anemias: Secondary | ICD-10-CM | POA: Insufficient documentation

## 2016-10-27 DIAGNOSIS — N189 Chronic kidney disease, unspecified: Secondary | ICD-10-CM | POA: Insufficient documentation

## 2016-10-27 DIAGNOSIS — R112 Nausea with vomiting, unspecified: Secondary | ICD-10-CM | POA: Insufficient documentation

## 2016-10-27 DIAGNOSIS — R05 Cough: Secondary | ICD-10-CM | POA: Insufficient documentation

## 2016-10-27 DIAGNOSIS — N183 Chronic kidney disease, stage 3 (moderate): Secondary | ICD-10-CM | POA: Insufficient documentation

## 2016-10-27 DIAGNOSIS — N289 Disorder of kidney and ureter, unspecified: Secondary | ICD-10-CM | POA: Diagnosis not present

## 2016-10-27 DIAGNOSIS — E1022 Type 1 diabetes mellitus with diabetic chronic kidney disease: Secondary | ICD-10-CM | POA: Diagnosis not present

## 2016-10-27 DIAGNOSIS — R109 Unspecified abdominal pain: Secondary | ICD-10-CM | POA: Diagnosis not present

## 2016-10-27 DIAGNOSIS — E1051 Type 1 diabetes mellitus with diabetic peripheral angiopathy without gangrene: Secondary | ICD-10-CM | POA: Insufficient documentation

## 2016-10-27 DIAGNOSIS — I129 Hypertensive chronic kidney disease with stage 1 through stage 4 chronic kidney disease, or unspecified chronic kidney disease: Secondary | ICD-10-CM | POA: Insufficient documentation

## 2016-10-27 DIAGNOSIS — Z7901 Long term (current) use of anticoagulants: Secondary | ICD-10-CM | POA: Insufficient documentation

## 2016-10-27 DIAGNOSIS — Z79899 Other long term (current) drug therapy: Secondary | ICD-10-CM | POA: Diagnosis not present

## 2016-10-27 LAB — CBC
HEMATOCRIT: 35.6 % — AB (ref 39.0–52.0)
Hemoglobin: 11.7 g/dL — ABNORMAL LOW (ref 13.0–17.0)
MCH: 28.6 pg (ref 26.0–34.0)
MCHC: 32.9 g/dL (ref 30.0–36.0)
MCV: 87 fL (ref 78.0–100.0)
Platelets: 318 10*3/uL (ref 150–400)
RBC: 4.09 MIL/uL — ABNORMAL LOW (ref 4.22–5.81)
RDW: 13.9 % (ref 11.5–15.5)
WBC: 11.6 10*3/uL — ABNORMAL HIGH (ref 4.0–10.5)

## 2016-10-27 LAB — COMPREHENSIVE METABOLIC PANEL
ALBUMIN: 2.9 g/dL — AB (ref 3.5–5.0)
ALK PHOS: 140 U/L — AB (ref 38–126)
ALT: 17 U/L (ref 17–63)
AST: 18 U/L (ref 15–41)
Anion gap: 10 (ref 5–15)
BILIRUBIN TOTAL: 0.6 mg/dL (ref 0.3–1.2)
BUN: 26 mg/dL — AB (ref 6–20)
CALCIUM: 8.5 mg/dL — AB (ref 8.9–10.3)
CO2: 22 mmol/L (ref 22–32)
Chloride: 104 mmol/L (ref 101–111)
Creatinine, Ser: 2.01 mg/dL — ABNORMAL HIGH (ref 0.61–1.24)
GFR calc Af Amer: 46 mL/min — ABNORMAL LOW (ref >60)
GFR calc non Af Amer: 40 mL/min — ABNORMAL LOW (ref >60)
GLUCOSE: 178 mg/dL — AB (ref 65–99)
POTASSIUM: 4.2 mmol/L (ref 3.5–5.1)
Sodium: 136 mmol/L (ref 135–145)
TOTAL PROTEIN: 7.2 g/dL (ref 6.5–8.1)

## 2016-10-27 LAB — URINALYSIS, ROUTINE W REFLEX MICROSCOPIC
Bacteria, UA: NONE SEEN
Bilirubin Urine: NEGATIVE
Glucose, UA: 150 mg/dL — AB
KETONES UR: NEGATIVE mg/dL
Leukocytes, UA: NEGATIVE
NITRITE: NEGATIVE
Specific Gravity, Urine: 1.019 (ref 1.005–1.030)
pH: 6 (ref 5.0–8.0)

## 2016-10-27 LAB — LIPASE, BLOOD: Lipase: 32 U/L (ref 11–51)

## 2016-10-27 MED ORDER — ONDANSETRON 4 MG PO TBDP
4.0000 mg | ORAL_TABLET | Freq: Once | ORAL | Status: AC | PRN
  Administered 2016-10-27: 4 mg via ORAL

## 2016-10-27 MED ORDER — ONDANSETRON 4 MG PO TBDP
ORAL_TABLET | ORAL | Status: AC
  Filled 2016-10-27: qty 1

## 2016-10-27 NOTE — ED Triage Notes (Addendum)
Pt states c/o n/v/d, x 8 days was seen here for the same 5 days ago- rx for phrengren and oxy provided no relief of symptoms. Pt notes URI symptoms cough, congestion started 3 days. Pt endorses fevers and chills, decreased appetite.   Pt sts has been unable to take bp meds x 5 days due to vomiting

## 2016-10-28 ENCOUNTER — Emergency Department (HOSPITAL_COMMUNITY)
Admission: EM | Admit: 2016-10-28 | Discharge: 2016-10-28 | Disposition: A | Discharge status: Home / Self Care | Payer: Medicare Other | Attending: Emergency Medicine | Admitting: Emergency Medicine

## 2016-10-28 DIAGNOSIS — R197 Diarrhea, unspecified: Secondary | ICD-10-CM

## 2016-10-28 DIAGNOSIS — D649 Anemia, unspecified: Secondary | ICD-10-CM

## 2016-10-28 DIAGNOSIS — N289 Disorder of kidney and ureter, unspecified: Secondary | ICD-10-CM

## 2016-10-28 DIAGNOSIS — R112 Nausea with vomiting, unspecified: Secondary | ICD-10-CM

## 2016-10-28 DIAGNOSIS — R109 Unspecified abdominal pain: Secondary | ICD-10-CM

## 2016-10-28 MED ORDER — DIPHENHYDRAMINE HCL 50 MG/ML IJ SOLN
25.0000 mg | Freq: Once | INTRAMUSCULAR | Status: AC
  Administered 2016-10-28: 25 mg via INTRAVENOUS
  Filled 2016-10-28: qty 1

## 2016-10-28 MED ORDER — HYDROCODONE-ACETAMINOPHEN 5-325 MG PO TABS
1.0000 | ORAL_TABLET | Freq: Once | ORAL | Status: AC
  Administered 2016-10-28: 1 via ORAL
  Filled 2016-10-28: qty 1

## 2016-10-28 MED ORDER — SODIUM CHLORIDE 0.9 % IV BOLUS (SEPSIS)
1000.0000 mL | Freq: Once | INTRAVENOUS | Status: AC
  Administered 2016-10-28: 1000 mL via INTRAVENOUS

## 2016-10-28 MED ORDER — PROCHLORPERAZINE 25 MG RE SUPP: 25.0000 mg | Freq: Two times a day (BID) | RECTAL | 0 refills | Status: DC | PRN

## 2016-10-28 MED ORDER — LOPERAMIDE HCL 2 MG PO CAPS
4.0000 mg | ORAL_CAPSULE | Freq: Once | ORAL | Status: AC
  Administered 2016-10-28: 4 mg via ORAL
  Filled 2016-10-28: qty 2

## 2016-10-28 MED ORDER — PROCHLORPERAZINE EDISYLATE 5 MG/ML IJ SOLN
10.0000 mg | Freq: Once | INTRAMUSCULAR | Status: AC
  Administered 2016-10-28: 10 mg via INTRAVENOUS
  Filled 2016-10-28: qty 2

## 2016-10-28 MED ORDER — HYDROCODONE-ACETAMINOPHEN 5-325 MG PO TABS: 1.0000 | ORAL_TABLET | ORAL | 0 refills | Status: DC | PRN

## 2016-10-28 MED ORDER — METOCLOPRAMIDE HCL 5 MG/ML IJ SOLN
10.0000 mg | Freq: Once | INTRAMUSCULAR | Status: AC
  Administered 2016-10-28: 10 mg via INTRAVENOUS
  Filled 2016-10-28: qty 2

## 2016-10-28 MED ORDER — PROCHLORPERAZINE MALEATE 10 MG PO TABS: 10.0000 mg | ORAL_TABLET | Freq: Four times a day (QID) | ORAL | 0 refills | Status: DC | PRN

## 2016-10-28 NOTE — Discharge Instructions (Signed)
Take loperamide as needed for diarrhea. Return if symptoms are getting worse.

## 2016-10-28 NOTE — ED Notes (Signed)
Pt. Called out and asked "did you forget about me?". I clarified to pt. That the doctor has been tied up and I have been monitoring his vitals. Will continue to monitor

## 2016-10-28 NOTE — ED Notes (Signed)
Attempted IV X1. Pt requested IV team

## 2016-10-28 NOTE — ED Notes (Signed)
Pt called out reporting restless legs and itching of forearm.  EDP notified and orders received.

## 2016-10-28 NOTE — ED Provider Notes (Signed)
[facility] EMERGENCY DEPARTMENT Provider Note   CSN: [id number] Arrival date & time: 10/27/16  1746     History   Chief Complaint Chief Complaint  Patient presents with  . Abdominal Pain  . Cough    HPI [name] is a 41 y.o. male.  The history is provided by the patient.  Abdominal Pain    Cough   He comes in because of ongoing nausea, vomiting, diarrhea.  He had been seen in the ED 3 days ago with abdominal pain, vomiting, diarrhea and was treated with IV fluids.  He was given ondansetron and then promethazine for nausea.  He was given morphine and then hydromorphone for pain.  CT of abdomen and pelvis was done and he was discharged with prescriptions for promethazine and oxycodone-acetaminophen.  He continues to have vomiting and diarrhea since then.  He has not been able to hold anything on his stomach.  Promethazine at home is not given him any relief from nausea, and oxycodone-acetaminophen is not giving him relief from pain.  He rates pain a 10/10.  He continues to have 6-8 bowel movements a day.  He denies blood or mucus in stool or emesis.  Illness started about 8 days ago.  Today, he had fever 103.  He continues to have chills and sweats. He denies any sick contacts.  Past Medical History:  Diagnosis Date  . Acute venous embolism and thrombosis of deep vessels of proximal lower extremity ([place]) 07/19/2011  . Anemia   . Chest pain, neg MI, normal coronaries by cath 02/18/2013  . CKD (chronic kidney disease) stage 3, GFR 30-59 ml/min (HCC) 02/19/2013  . Colon polyps   . Diabetic ulcer of right foot ([place])   . DVT (deep venous thrombosis) ([place]) 09/2002   patient reports additional DVTs in '06 & '11 (unconfirmed)  . GERD (gastroesophageal reflux disease)   . Hyperlipidemia 02/19/2013  . Hypertension   . Nephrotic syndrome   . Obesity    BMI 44, weight 346 pounds 01/30/14  . Pulmonary embolism ([place]) 09/2002   treated with 6 months of warfarin  . Type I  diabetes mellitus ([place]) dx'd 2001    Patient Active Problem List   Diagnosis Date Noted  . Prosthesis adjustment 08/17/2016  . History of pulmonary embolus (PE) 06/09/2016  . Normocytic anemia 01/30/2014  . Constipation   . Above knee amputation of right lower extremity ([place]) 12/26/2013  . GERD (gastroesophageal reflux disease) 08/14/2013  . Hyperlipidemia 02/19/2013  . S/P cardiac cath, 02/18/13, normal coronaries 02/19/2013  . CKD (chronic kidney disease) stage 3, GFR 30-59 ml/min (HCC) 02/19/2013  . Nephrotic syndrome 02/12/2012  . Essential hypertension 07/24/2011  . Long term current use of anticoagulant therapy 07/19/2011  . Back pain 07/12/2011  . History of DVT (deep vein thrombosis) 07/28/2009  . PROTEINURIA, MILD 02/01/2007  . Morbid obesity ([place]) 10/26/2005  . DM (diabetes mellitus) type I uncontrolled, periph vascular disorder ([place]) 01/09/2002    Past Surgical History:  Procedure Laterality Date  . AMPUTATION Right 12/22/2013   Procedure: AMPUTATION BELOW KNEE;  Surgeon: [name], MD;  Location: [facility];  Service: Orthopedics;  Laterality: Right;  . AMPUTATION Right 02/19/2014   Procedure: RIGHT ABOVE KNEE AMPUTATION ;  Surgeon: [name], MD;  Location: [facility];  Service: Orthopedics;  Laterality: Right;  . CARDIAC CATHETERIZATION  02/18/2013   normal coronaries  . I&D EXTREMITY Right 12/14/2013   Procedure: IRRIGATION AND DEBRIDEMENT RIGHT FOOT;  Surgeon: Remo Lipps  Orlena Sheldon, MD;  Location: [facility];  Service: Orthopedics;  Laterality: Right;  . INCISION AND DRAINAGE ABSCESS  2007; 2015   "back"  . INCISION AND DRAINAGE OF WOUND Right 12/22/2013   Procedure: I&D RIGHT BUTTOCK;  Surgeon: [name], MD;  Location: [facility];  Service: Orthopedics;  Laterality: Right;  . LEFT HEART CATHETERIZATION WITH CORONARY ANGIOGRAM N/A 02/18/2013   Procedure: LEFT HEART CATHETERIZATION WITH CORONARY ANGIOGRAM;  Surgeon: [name], MD;  Location[facility] C[facility] LAB;  Service: Cardiovascular;   Laterality: N/A;       Home Medications    Prior to Admission medications   Medication Sig Start Date End Date Taking? Authorizing Provider  acetaminophen (TYLENOL) 325 MG tablet Take 1-2 tablets (325-650 mg total) by mouth every 4 (four) hours as needed for mild pain. 01/20/14  Yes Love, Ivan Anchors, PA-C  allopurinol (ZYLOPRIM) 100 MG tablet Take 2 tablets (200 mg total) by mouth daily. 08/03/16  Yes Hoffman, Jessica Ratliff, DO  Alpha-Lipoic Acid 200 MG CAPS Take 1 capsule (200 mg total) by mouth daily. 08/17/16  Yes Hoffman, Jessica Ratliff, DO  amLODipine (NORVASC) 5 MG tablet Take 1 tablet (5 mg total) by mouth daily. 08/17/16 08/17/17 Yes Hoffman, Elza Rafter, DO  apixaban (ELIQUIS) 5 MG TABS tablet Take 1 tablet (5 mg total) by mouth 2 (two) times daily. 08/11/16  Yes Axel Filler, MD  calcitRIOL (ROCALTROL) 0.25 MCG capsule Take 1 capsule (0.25 mcg total) by mouth daily. 08/11/16  Yes Axel Filler, MD  cloNIDine (CATAPRES) 0.2 MG tablet Take 1 tablet (0.2 mg total) by mouth 2 (two) times daily. 08/17/16 08/17/17 Yes Hoffman, Jessica Ratliff, DO  furosemide (LASIX) 40 MG tablet Take 2 tablets (80 mg total) by mouth 2 (two) times daily. 08/11/16  Yes Axel Filler, MD  gabapentin (NEURONTIN) 300 MG capsule Take 1 capsule (300 mg total) by mouth 3 (three) times daily. 08/11/16  Yes Axel Filler, MD  insulin aspart (NOVOLOG) 100 UNIT/ML FlexPen Inject 25 Units into the skin 3 (three) times daily. 08/11/16 08/11/17 Yes Axel Filler, MD  Insulin Degludec 200 UNIT/ML SOPN Inject 140 Units into the skin daily. 08/11/16  Yes Axel Filler, MD  linagliptin (TRADJENTA) 5 MG TABS tablet Take 1 tablet (5 mg total) by mouth daily. 08/11/16  Yes Axel Filler, MD  loratadine (CLARITIN) 10 MG tablet Take 10 mg by mouth daily as needed for allergies.    Yes [provider]  lovastatin (MEVACOR) 20 MG tablet Take 1 tablet (20 mg total) by mouth daily.  06/08/16 06/08/17 Yes Velna Ochs, MD  metolazone (ZAROXOLYN) 2.5 MG tablet Take 2.5 mg by mouth every other day.   Yes [provider]  ondansetron (ZOFRAN) 4 MG tablet Take 4 mg by mouth every 6 (six) hours as needed for nausea or vomiting.    Yes [provider]  oxyCODONE-acetaminophen (PERCOCET/ROXICET) 5-325 MG tablet Take 1-2 tablets by mouth every 6 (six) hours as needed for severe pain. 10/25/16  Yes Montine Circle, PA-C  polyethylene glycol (MIRALAX / GLYCOLAX) packet Take 17 g by mouth daily as needed for mild constipation.    Yes [provider]  promethazine (PHENERGAN) 25 MG tablet Take 1 tablet (25 mg total) by mouth every 6 (six) hours as needed for nausea or vomiting. 10/25/16  Yes Montine Circle, PA-C  triamcinolone cream (KENALOG) 0.1 % Apply 1 application topically 2 (two) times daily as needed (rash).    Yes [provider]  Zinc Gluconate 100 MG TABS Take 2.2 tablets (220 mg total) by mouth daily. 08/17/16  Yes Hoffman, Jessica Ratliff, DO  Blood Glucose Monitoring Suppl (ACCU-CHEK AVIVA) device Use to check blood sugar 5 times daily. Diag code E10.51. Multiple insulin dependent 08/11/16 08/11/17  Axel Filler, MD  glucose blood (ACCU-CHEK AVIVA) test strip Use to check blood sugars 5 times a day. Dx code:E11.9. Insulin dependent. 08/14/16   [name], DO  Insulin Pen Needle (PEN NEEDLES 3/16") 31G X 5 MM MISC Use five times daily 08/14/16   [name], DO  Lancet Devices [facility]) lancets Use to check blood sugars 5 times a day. Dx code:E11.9. Insulin dependent. 08/14/16   [name], DO    Family History Family History  Problem Relation Age of Onset  . Heart disease Mother   . Diabetes Mother   . Diabetes Maternal Uncle   . Diabetes Cousin     Social History Social History  Substance Use Topics  . Smoking status: Never Smoker  . Smokeless tobacco: Never Used  . Alcohol  use No     Comment: 12/09/2013 "a few beers q 6 months or so"     Allergies   Patient has no known allergies.   Review of Systems Review of Systems  Respiratory: Positive for cough.   Gastrointestinal: Positive for abdominal pain.  All other systems reviewed and are negative.    Physical Exam Updated Vital Signs BP (!) 190/90   Pulse 96   Temp 99.3 F (37.4 C) (Oral)   Resp 17   Ht 6\' 2"  (1.88 m)   Wt (!) 172.4 kg (380 lb)   SpO2 98%   BMI 48.79 kg/m   Physical Exam  Nursing note and vitals reviewed.  Morbidly obese 41 year old male, resting comfortably and in no acute distress. Vital signs are significant for hypertension. Oxygen saturation is 98%, which is normal. Head is normocephalic and atraumatic. PERRLA, EOMI. Oropharynx is clear. Neck is nontender and supple without adenopathy or JVD. Back is nontender and there is no CVA tenderness. Lungs are clear without rales, wheezes, or rhonchi. Chest is nontender. Heart has regular rate and rhythm without murmur. Abdomen is soft, flat, nontender without masses or hepatosplenomegaly and peristalsis is hypoactive. Extremities: Right above-the-knee amputation. 2+ edema present. Skin is warm and dry without rash. Neurologic: Mental status is normal, cranial nerves are intact, there are no motor or sensory deficits.  ED Treatments / Results  Labs (all labs ordered are listed, but only abnormal results are displayed) Labs Reviewed  COMPREHENSIVE METABOLIC PANEL - Abnormal; Notable for the following:       Result Value   Glucose, Bld 178 (*)    BUN 26 (*)    Creatinine, Ser 2.01 (*)    Calcium 8.5 (*)    Albumin 2.9 (*)    Alkaline Phosphatase 140 (*)    GFR calc non Af Amer 40 (*)    GFR calc Af Amer 46 (*)    All other components within normal limits  CBC - Abnormal; Notable for the following:    WBC 11.6 (*)    RBC 4.09 (*)    Hemoglobin 11.7 (*)    HCT 35.6 (*)    All other components within normal limits    URINALYSIS, ROUTINE W REFLEX MICROSCOPIC - Abnormal; Notable for the following:    Glucose, UA 150 (*)    Hgb urine dipstick SMALL (*)  Protein, ur >=300 (*)    Squamous Epithelial / LPF 0-5 (*)    All other components within normal limits  LIPASE, BLOOD    Procedures Procedures (including critical care time)  Medications Ordered in ED Medications  ondansetron (ZOFRAN-ODT) 4 MG disintegrating tablet (not administered)  sodium chloride 0.9 % bolus 1,000 mL (not administered)  metoCLOPramide (REGLAN) injection 10 mg (not administered)  diphenhydrAMINE (BENADRYL) injection 25 mg (not administered)  HYDROcodone-acetaminophen (NORCO/VICODIN) 5-325 MG per tablet 1 tablet (not administered)  loperamide (IMODIUM) capsule 4 mg (not administered)  ondansetron (ZOFRAN-ODT) disintegrating tablet 4 mg (4 mg Oral Given 10/27/16 1849)     Initial Impression / Assessment and Plan / ED Course  I have reviewed the triage vital signs and the nursing notes.  Pertinent lab results that were available during my care of the patient were reviewed by me and considered in my medical decision making (see chart for details).  Abdominal pain, nausea, vomiting, diarrhea.  This has a clinical appearance of viral gastroenteritis.  However, it is unusual for viral gastroenteritis to last this long.  Old records are reviewed confirming a recent ED visit with similar symptoms and a CT of abdomen and pelvis was unremarkable.  Laboratory workup today is reassuring.  Renal function is stable and WBC is declining.  I do not see indications for repeat imaging.  He is given IV fluids,  Metoclopramide as well as oral loperamide and hydrocodone-acetaminophen, and will be reassessed.  He had a dysphoric reaction to metoclopramide, treated with additional diphenhydramine.  Dysphoria improved, but he was still having nausea.  Pain was improved with hydrocodone.  He is given prochlorperazine with a good relief of nausea.  He is  discharged with prescription for prochlorperazine-both tablet and suppository.  Also given prescription for small number of hydrocodone-acetaminophen tablets.  Advised to use over-the-counter loperamide for diarrhea.  Final Clinical Impressions(s) / ED Diagnoses   Final diagnoses:  Nausea vomiting and diarrhea  Abdominal pain, unspecified abdominal location  Renal insufficiency  Normochromic normocytic anemia    New Prescriptions New Prescriptions   HYDROCODONE-ACETAMINOPHEN (NORCO) 5-325 MG TABLET    Take 1 tablet by mouth every 4 (four) hours as needed for moderate pain.   PROCHLORPERAZINE (COMPAZINE) 10 MG TABLET    Take 1 tablet (10 mg total) by mouth every 6 (six) hours as needed for nausea or vomiting.   PROCHLORPERAZINE (COMPAZINE) 25 MG SUPPOSITORY    Place 1 suppository (25 mg total) rectally every 12 (twelve) hours as needed for nausea or vomiting.     Delora Fuel, MD 65/03/54 [phone]

## 2016-10-31 ENCOUNTER — Other Ambulatory Visit: Payer: Self-pay

## 2016-10-31 ENCOUNTER — Ambulatory Visit (INDEPENDENT_AMBULATORY_CARE_PROVIDER_SITE_OTHER): Payer: Medicare Other | Admitting: Internal Medicine

## 2016-10-31 ENCOUNTER — Inpatient Hospital Stay (HOSPITAL_COMMUNITY)
Admission: AD | Admit: 2016-10-31 | Discharge: 2016-11-02 | DRG: 074 | Disposition: A | Discharge status: Home / Self Care | Payer: Medicare Other | Source: Ambulatory Visit | Attending: Internal Medicine | Admitting: Internal Medicine

## 2016-10-31 ENCOUNTER — Encounter (HOSPITAL_COMMUNITY): Payer: Self-pay | Admitting: General Practice

## 2016-10-31 VITALS — BP 182/84 | HR 101 | Temp 98.7°F

## 2016-10-31 DIAGNOSIS — R112 Nausea with vomiting, unspecified: Secondary | ICD-10-CM

## 2016-10-31 DIAGNOSIS — I129 Hypertensive chronic kidney disease with stage 1 through stage 4 chronic kidney disease, or unspecified chronic kidney disease: Secondary | ICD-10-CM | POA: Diagnosis present

## 2016-10-31 DIAGNOSIS — K219 Gastro-esophageal reflux disease without esophagitis: Secondary | ICD-10-CM | POA: Diagnosis present

## 2016-10-31 DIAGNOSIS — E1022 Type 1 diabetes mellitus with diabetic chronic kidney disease: Secondary | ICD-10-CM | POA: Diagnosis not present

## 2016-10-31 DIAGNOSIS — N183 Chronic kidney disease, stage 3 unspecified: Secondary | ICD-10-CM | POA: Diagnosis present

## 2016-10-31 DIAGNOSIS — Z794 Long term (current) use of insulin: Secondary | ICD-10-CM | POA: Diagnosis not present

## 2016-10-31 DIAGNOSIS — R1012 Left upper quadrant pain: Secondary | ICD-10-CM

## 2016-10-31 DIAGNOSIS — Z89611 Acquired absence of right leg above knee: Secondary | ICD-10-CM

## 2016-10-31 DIAGNOSIS — E1043 Type 1 diabetes mellitus with diabetic autonomic (poly)neuropathy: Secondary | ICD-10-CM | POA: Diagnosis not present

## 2016-10-31 DIAGNOSIS — Z86718 Personal history of other venous thrombosis and embolism: Secondary | ICD-10-CM | POA: Diagnosis not present

## 2016-10-31 DIAGNOSIS — Z6841 Body Mass Index (BMI) 40.0 and over, adult: Secondary | ICD-10-CM

## 2016-10-31 DIAGNOSIS — Z8249 Family history of ischemic heart disease and other diseases of the circulatory system: Secondary | ICD-10-CM

## 2016-10-31 DIAGNOSIS — Z23 Encounter for immunization: Secondary | ICD-10-CM

## 2016-10-31 DIAGNOSIS — Z7901 Long term (current) use of anticoagulants: Secondary | ICD-10-CM

## 2016-10-31 DIAGNOSIS — E1051 Type 1 diabetes mellitus with diabetic peripheral angiopathy without gangrene: Secondary | ICD-10-CM | POA: Diagnosis not present

## 2016-10-31 DIAGNOSIS — E8770 Fluid overload, unspecified: Secondary | ICD-10-CM | POA: Diagnosis present

## 2016-10-31 DIAGNOSIS — I252 Old myocardial infarction: Secondary | ICD-10-CM

## 2016-10-31 DIAGNOSIS — Z87441 Personal history of nephrotic syndrome: Secondary | ICD-10-CM

## 2016-10-31 DIAGNOSIS — Z87898 Personal history of other specified conditions: Secondary | ICD-10-CM | POA: Insufficient documentation

## 2016-10-31 DIAGNOSIS — K760 Fatty (change of) liver, not elsewhere classified: Secondary | ICD-10-CM

## 2016-10-31 DIAGNOSIS — R933 Abnormal findings on diagnostic imaging of other parts of digestive tract: Secondary | ICD-10-CM

## 2016-10-31 DIAGNOSIS — Z79899 Other long term (current) drug therapy: Secondary | ICD-10-CM

## 2016-10-31 DIAGNOSIS — E119 Type 2 diabetes mellitus without complications: Secondary | ICD-10-CM | POA: Diagnosis present

## 2016-10-31 DIAGNOSIS — G8929 Other chronic pain: Secondary | ICD-10-CM

## 2016-10-31 DIAGNOSIS — Z86711 Personal history of pulmonary embolism: Secondary | ICD-10-CM

## 2016-10-31 DIAGNOSIS — E10319 Type 1 diabetes mellitus with unspecified diabetic retinopathy without macular edema: Secondary | ICD-10-CM | POA: Diagnosis present

## 2016-10-31 DIAGNOSIS — K3184 Gastroparesis: Secondary | ICD-10-CM | POA: Diagnosis present

## 2016-10-31 DIAGNOSIS — IMO0002 Reserved for concepts with insufficient information to code with codable children: Secondary | ICD-10-CM

## 2016-10-31 DIAGNOSIS — R000 Tachycardia, unspecified: Secondary | ICD-10-CM | POA: Diagnosis present

## 2016-10-31 DIAGNOSIS — N184 Chronic kidney disease, stage 4 (severe): Secondary | ICD-10-CM | POA: Diagnosis present

## 2016-10-31 DIAGNOSIS — Z86010 Personal history of colonic polyps: Secondary | ICD-10-CM

## 2016-10-31 DIAGNOSIS — E1065 Type 1 diabetes mellitus with hyperglycemia: Secondary | ICD-10-CM | POA: Diagnosis present

## 2016-10-31 DIAGNOSIS — E10649 Type 1 diabetes mellitus with hypoglycemia without coma: Secondary | ICD-10-CM | POA: Diagnosis present

## 2016-10-31 DIAGNOSIS — I10 Essential (primary) hypertension: Secondary | ICD-10-CM | POA: Diagnosis present

## 2016-10-31 DIAGNOSIS — Z833 Family history of diabetes mellitus: Secondary | ICD-10-CM

## 2016-10-31 DIAGNOSIS — S78111A Complete traumatic amputation at level between right hip and knee, initial encounter: Secondary | ICD-10-CM

## 2016-10-31 DIAGNOSIS — R1011 Right upper quadrant pain: Secondary | ICD-10-CM

## 2016-10-31 HISTORY — DX: Personal history of other medical treatment: Z92.89

## 2016-10-31 HISTORY — DX: Nausea with vomiting, unspecified: R11.2

## 2016-10-31 LAB — POCT GLYCOSYLATED HEMOGLOBIN (HGB A1C): Hemoglobin A1C: 6.3

## 2016-10-31 LAB — CBC WITH DIFFERENTIAL/PLATELET
BASOS ABS: 0 10*3/uL (ref 0.0–0.1)
Basophils Relative: 0 %
EOS ABS: 0.1 10*3/uL (ref 0.0–0.7)
EOS PCT: 1 %
HCT: 37.4 % — ABNORMAL LOW (ref 39.0–52.0)
Hemoglobin: 12 g/dL — ABNORMAL LOW (ref 13.0–17.0)
LYMPHS PCT: 16 %
Lymphs Abs: 1.9 10*3/uL (ref 0.7–4.0)
MCH: 28.1 pg (ref 26.0–34.0)
MCHC: 32.1 g/dL (ref 30.0–36.0)
MCV: 87.6 fL (ref 78.0–100.0)
Monocytes Absolute: 0.6 10*3/uL (ref 0.1–1.0)
Monocytes Relative: 5 %
Neutro Abs: 9.2 10*3/uL — ABNORMAL HIGH (ref 1.7–7.7)
Neutrophils Relative %: 78 %
Platelets: 336 10*3/uL (ref 150–400)
RBC: 4.27 MIL/uL (ref 4.22–5.81)
RDW: 14.5 % (ref 11.5–15.5)
WBC: 11.8 10*3/uL — AB (ref 4.0–10.5)

## 2016-10-31 LAB — LIPASE, BLOOD: LIPASE: 47 U/L (ref 11–51)

## 2016-10-31 LAB — COMPREHENSIVE METABOLIC PANEL
ALT: 25 U/L (ref 17–63)
AST: 21 U/L (ref 15–41)
Albumin: 2.8 g/dL — ABNORMAL LOW (ref 3.5–5.0)
Alkaline Phosphatase: 149 U/L — ABNORMAL HIGH (ref 38–126)
Anion gap: 9 (ref 5–15)
BUN: 32 mg/dL — ABNORMAL HIGH (ref 6–20)
CALCIUM: 8.8 mg/dL — AB (ref 8.9–10.3)
CHLORIDE: 106 mmol/L (ref 101–111)
CO2: 24 mmol/L (ref 22–32)
Creatinine, Ser: 2.17 mg/dL — ABNORMAL HIGH (ref 0.61–1.24)
GFR calc Af Amer: 42 mL/min — ABNORMAL LOW (ref >60)
GFR, EST NON AFRICAN AMERICAN: 37 mL/min — AB (ref >60)
Glucose, Bld: 186 mg/dL — ABNORMAL HIGH (ref 65–99)
Potassium: 4.1 mmol/L (ref 3.5–5.1)
Sodium: 139 mmol/L (ref 135–145)
Total Bilirubin: 0.6 mg/dL (ref 0.3–1.2)
Total Protein: 6.9 g/dL (ref 6.5–8.1)

## 2016-10-31 LAB — GLUCOSE, CAPILLARY
GLUCOSE-CAPILLARY: 177 mg/dL — AB (ref 65–99)
Glucose-Capillary: 158 mg/dL — ABNORMAL HIGH (ref 65–99)
Glucose-Capillary: 187 mg/dL — ABNORMAL HIGH (ref 65–99)

## 2016-10-31 MED ORDER — ACETAMINOPHEN 650 MG RE SUPP: 650.0000 mg | Freq: Four times a day (QID) | RECTAL | Status: DC | PRN

## 2016-10-31 MED ORDER — ENOXAPARIN SODIUM 150 MG/ML ~~LOC~~ SOLN
1.0000 mg/kg | Freq: Two times a day (BID) | SUBCUTANEOUS | Status: DC
  Administered 2016-10-31 – 2016-11-01 (×2): 160 mg via SUBCUTANEOUS
  Filled 2016-10-31 (×3): qty 1.07

## 2016-10-31 MED ORDER — PROMETHAZINE HCL 25 MG/ML IJ SOLN
6.2500 mg | Freq: Four times a day (QID) | INTRAMUSCULAR | Status: DC | PRN
Start: 2016-10-31 — End: 2016-11-01
  Administered 2016-10-31 – 2016-11-01 (×3): 6.25 mg via INTRAVENOUS
  Filled 2016-10-31 (×3): qty 1

## 2016-10-31 MED ORDER — INFLUENZA VAC SPLIT QUAD 0.5 ML IM SUSY
0.5000 mL | PREFILLED_SYRINGE | INTRAMUSCULAR | Status: AC
  Administered 2016-11-01: 0.5 mL via INTRAMUSCULAR
  Filled 2016-10-31: qty 0.5

## 2016-10-31 MED ORDER — CLONIDINE HCL 0.2 MG PO TABS
0.2000 mg | ORAL_TABLET | Freq: Two times a day (BID) | ORAL | Status: DC
  Administered 2016-10-31 – 2016-11-02 (×5): 0.2 mg via ORAL
  Filled 2016-10-31 (×5): qty 1

## 2016-10-31 MED ORDER — FUROSEMIDE 10 MG/ML IJ SOLN
40.0000 mg | Freq: Once | INTRAMUSCULAR | Status: AC
  Administered 2016-10-31: 40 mg via INTRAVENOUS
  Filled 2016-10-31: qty 4

## 2016-10-31 MED ORDER — PROMETHAZINE HCL 25 MG PO TABS
12.5000 mg | ORAL_TABLET | ORAL | Status: DC | PRN
  Filled 2016-10-31: qty 1

## 2016-10-31 MED ORDER — RAMELTEON 8 MG PO TABS
8.0000 mg | ORAL_TABLET | Freq: Once | ORAL | Status: AC
  Administered 2016-10-31: 8 mg via ORAL
  Filled 2016-10-31: qty 1

## 2016-10-31 MED ORDER — INSULIN GLARGINE 100 UNIT/ML ~~LOC~~ SOLN
70.0000 [IU] | Freq: Every day | SUBCUTANEOUS | Status: DC
Start: 2016-10-31 — End: 2016-11-02
  Administered 2016-10-31 – 2016-11-02 (×2): 70 [IU] via SUBCUTANEOUS
  Filled 2016-10-31 (×4): qty 0.7

## 2016-10-31 MED ORDER — FUROSEMIDE 10 MG/ML IJ SOLN
INTRAMUSCULAR | Status: AC
  Filled 2016-10-31: qty 4

## 2016-10-31 MED ORDER — INSULIN ASPART 100 UNIT/ML ~~LOC~~ SOLN
0.0000 [IU] | SUBCUTANEOUS | Status: DC
  Administered 2016-10-31: 3 [IU] via SUBCUTANEOUS

## 2016-10-31 MED ORDER — PROCHLORPERAZINE EDISYLATE 5 MG/ML IJ SOLN
10.0000 mg | Freq: Four times a day (QID) | INTRAMUSCULAR | Status: DC | PRN
  Administered 2016-10-31: 10 mg via INTRAVENOUS
  Filled 2016-10-31: qty 2

## 2016-10-31 MED ORDER — AMLODIPINE BESYLATE 5 MG PO TABS
5.0000 mg | ORAL_TABLET | Freq: Every day | ORAL | Status: DC
  Administered 2016-10-31 – 2016-11-02 (×3): 5 mg via ORAL
  Filled 2016-10-31 (×3): qty 1

## 2016-10-31 MED ORDER — BOOST / RESOURCE BREEZE PO LIQD
1.0000 | Freq: Three times a day (TID) | ORAL | Status: DC
  Administered 2016-11-01 – 2016-11-02 (×2): 1 via ORAL

## 2016-10-31 MED ORDER — ACETAMINOPHEN 325 MG PO TABS: 650.0000 mg | ORAL_TABLET | Freq: Four times a day (QID) | ORAL | Status: DC | PRN

## 2016-10-31 NOTE — Assessment & Plan Note (Addendum)
Patient has not taken his insulin as he has not eaten since inability to tolerate oral intake. He has been on Tresiba 140 units daily, Novolog 25 units TID, and Linagliptin 5 mg daily. CBG today is 177 and repeat A1c is well-controlled at 6.3.

## 2016-10-31 NOTE — Progress Notes (Signed)
Med teaching paged about patient's arrival to floor and admitting BP of 223/100. Dr. Jari Favre stated she will put in some orders.

## 2016-10-31 NOTE — Progress Notes (Signed)
Internal Medicine Clinic Attending  Case discussed with Dr. Patel at the time of the visit.  We reviewed the resident's history and exam and pertinent patient test results.  I agree with the assessment, diagnosis, and plan of care documented in the resident's note.  

## 2016-10-31 NOTE — Progress Notes (Addendum)
Med teaching aware of patients blood pressure, no new orders at this time. Will continue to monitor.

## 2016-10-31 NOTE — Progress Notes (Signed)
CC: Intractable nausea and vomiting  HPI:  [name] is a 41 y.o. male with PMH as listed below including HTN, T1DM, CKD Stage 3, Hx of PE/DVT on Eliquis, s/p AKA RLE, and Obesity who presents with intractable nausea and vomiting for 2 weeks.  Intractable Nausea & Vomiting: Patient reports being in his usual state of health until 2 weeks ago. He first noticed having a cough which has since eased up. He then began to have nausea and vomiting and inability to keep anything down including solid foods, liquids, and medications. He says he will vomit nearly immediately after eating or drinking (water, tea, Gatorade). His symptoms do occur at other times during the day without recent food/liquid intake.   He noticed spotty bleeding when brushing his teeth today but no hematemesis or hemoptysis. He denies any pain with swallowing. He says he was eating and drinking well prior to his current symptoms without any early satiety. He has associated generalized abdominal pain, dizziness, some shortness of breath, chills, and sweats. He is having decreased urinary frequency since he has not been able to take his diuretics or oral fluids. He denies any melena, BRBPR, LOC, or fevers.   He was seen in the ED twice for his symptoms: 10/24/16: Sent to ED from Urgent Care. CT Abd/Pelvis showed cholelithiasis without gallbladder wall thickening, inflammation of pancreas, or diverticulitis. He was treated with fluids and antiemetics. 10/28/16: Returned to ED with persistent symptoms. Viral gastroenteritis was suspected. Treated with IV fluids, Metoclopramide (had dysphoric reaction treated with diphenhydramine). Nausea relieved with Prochlorperazine which he was prescribed but unable to obtain.  He is scheduled for an EGD on 11/10/16 as prior outside CT imaging showed distal esophageal wall thickening. He denies tobacco use, reports social alcohol use, and denies illicit drug use including marijuana, cocaine, or  IV drugs.  Past Medical History:  Diagnosis Date  . Acute venous embolism and thrombosis of deep vessels of proximal lower extremity ([place]) 07/19/2011  . Anemia   . Chest pain, neg MI, normal coronaries by cath 02/18/2013  . CKD (chronic kidney disease) stage 3, GFR 30-59 ml/min (HCC) 02/19/2013  . Colon polyps   . Diabetic ulcer of right foot ([place])   . DVT (deep venous thrombosis) ([place]) 09/2002   patient reports additional DVTs in '06 & '11 (unconfirmed)  . GERD (gastroesophageal reflux disease)   . Hyperlipidemia 02/19/2013  . Hypertension   . Nephrotic syndrome   . Obesity    BMI 44, weight 346 pounds 01/30/14  . Pulmonary embolism ([place]) 09/2002   treated with 6 months of warfarin  . Type I diabetes mellitus ([place]) dx'd 2001   Review of Systems:   Review of Systems  Constitutional: Positive for chills and diaphoresis. Negative for fever.  Respiratory: Positive for cough and shortness of breath. Negative for hemoptysis, sputum production and wheezing.   Cardiovascular: Negative for chest pain.  Gastrointestinal: Positive for abdominal pain, diarrhea, nausea and vomiting. Negative for blood in stool, constipation, heartburn and melena.       No odynophagia  Genitourinary: Negative for dysuria, frequency, hematuria and urgency.  Musculoskeletal: Negative for falls.  Neurological: Positive for dizziness. Negative for loss of consciousness.  Psychiatric/Behavioral: Negative for substance abuse.    Physical Exam:  Vitals:   10/31/16 1111 10/31/16 1132  BP: (!) 226/122 (!) 182/84  Pulse: (!) 101 (!) 101  Temp: 98.7 F (37.1 C)   TempSrc: Oral   SpO2: 99%    Physical Exam  Constitutional: He is oriented to person, place, and time. He appears well-developed and well-nourished.  Obese man, resting in wheelchair. Actively vomiting.  HENT:  Head: Normocephalic and atraumatic.  Mouth/Throat: Oropharynx is clear and moist. No oropharyngeal exudate.  Neck: Neck supple.    Cardiovascular: Regular rhythm.   Tachycardic  Pulmonary/Chest: Effort normal. No respiratory distress. He has no wheezes. He has no rales.  Abdominal: Soft. Bowel sounds are normal.  Obese abdomen, tender to palpation all quadrants, greater in epigastric region  Musculoskeletal:  S/p AKA RLE  Lymphadenopathy:    He has no cervical adenopathy.  Neurological: He is alert and oriented to person, place, and time.  Skin: Skin is warm. He is not diaphoretic.  Psychiatric: He has a normal mood and affect.    Assessment & Plan:   See Encounters Tab for problem based charting.  Patient discussed with Dr. Lynnae January  Intractable vomiting with nausea Patient with 2 weeks intractable nausea and vomiting with inability to maintain adequate nutrition/hydration or to take his medications. Differential includes upper GI obstruction/stenosis of esophagus, Gastroparesis, gallbladder disease, or pancreatitis (recent lipases have been normal). Given his persistent symptoms, I do think he warrants admission for symptomatic management and further workup if indicated. He is hypertensive in clinic today in the setting of inability to take his Clonidine, Amlodipine, Coreg, Lasix, or Metolazone. I have discussed the case with our on call admitting team who have agreed to evaluate patient for admission. - STAT CBC, CMET, Lipase - Admit to med-surg bed for IV antiemetics and IV fluid resuscitation - Consider expedited EGD; Gastric emptying study if able to tolerate  DM (diabetes mellitus) type I uncontrolled, periph vascular disorder Patient has not taken his insulin as he has not eaten since inability to tolerate oral intake. He has been on Tresiba 140 units daily, Novolog 25 units TID, and Linagliptin 5 mg daily. CBG today is 177 and repeat A1c is well-controlled at 6.3.

## 2016-10-31 NOTE — Progress Notes (Signed)
ANTICOAGULATION CONSULT NOTE - Initial Consult  Pharmacy Consult for enoxaparin Indication: pulmonary embolus and DVT  No Known Allergies  Patient Measurements: Height: 6\' 2"  (188 cm) Weight: (!) 354 lb 8 oz (160.8 kg) IBW/kg (Calculated) : 82.2  Vital Signs: Temp: 98.6 F (37 C) (10/23 1703) Temp Source: Oral (10/23 1703) BP: 211/92 (10/23 1703) Pulse Rate: 106 (10/23 1703)  Labs:  Recent Labs  10/31/16 1158  HGB 12.0*  HCT 37.4*  PLT 336  CREATININE 2.17*    Estimated Creatinine Clearance: 73.4 mL/min (A) (by C-G formula based on SCr of 2.17 mg/dL (H)).  Assessment: CC/HPI: 41 yo m presenting from IMTS clinic with intractable N/V  PMH: CKD3, Hx of DVT on apixaban, HLD, HTN, DM1  Anticoag: apixaban pta for hx of dvt/pe - last dose 10/14  Renal: Scr 2.17  Heme/Onc:H&H 12/37.4, plt 336    Plan:  Lovenox 1 mg/kg q12 Monitor renal fx F/u ability to take PO and convert back to apixaban  Levester Fresh, PharmD, BCPS, BCCCP Clinical Pharmacist Clinical phone for 10/31/2016 [phone] 10/31/2016 6:02 PM

## 2016-10-31 NOTE — H&P (Signed)
Date: 10/31/2016               Patient Name:  [name] MRN: [id number]  DOB: 1975/06/29 Age / Sex: 41 y.o., male   PCP: [name], DO         Medical Service: Internal Medicine Teaching Service         Attending Physician: Dr. Lucious Groves, DO    First Contact: Dr. Aggie Hacker Pager: [phone]  Second Contact: Dr. Jari Favre Pager: [phone]       After Hours (After 5p/  First Contact Pager: [phone]  weekends / holidays): Second Contact Pager: [phone]   Chief Complaint: nausea and vomiting   History of Present Illness:  [name] is a 41 y.o. male with PMHx including HTN, T1DM, CKD Stage 3, Hx of PE/DVT on Eliquis, s/p AKA RLE, and obesity who presents with intractable nausea and vomiting for 2 weeks. Patient reports being in his normal state of health until 2 weeks ago when he began to have nausea and vomiting, with the inability to keep anything down including solid foods, liquids, and medications (has not taken any of his medications). He says he will vomit immediately after eating or drinking. He has been vomiting 4-5 times daily and states it is non-bloody, non-bilious. He also has associated abdominal pain, which is worst after his episodes of vomiting, but also occurs sporadically. He states it is achy in quality. The patient has also been experiencing diarrhea 1-2 times per day. He denies BRBPR or melena. Patient did undergo colonoscopy in [place] per Dr. Lyda Jester in July 2017 due to change in bowel habits. This was a negative exam with the exception of a 5 mm polyp which was removed and was a tubular adenoma. He is scheduled for an EGD on 11/10/16 to rule out Barrett's/neoplasm as prior outside CT imaging showed distal esophageal wall thickening.   He denies any pain with swallowing or dysphagia. He has associated dizziness, some shortness of breath, chills, and sweats. He is having decreased urinary frequency since he has not been able to take his diuretics  or oral fluids. He denies fevers, recent travel, sick contacts, or previous abdominal surgeries.   Per recent GI note he has been experiencing chronic abdominal pain for over two years,   He was seen in the ED twice for his symptoms: 10/24/16: Sent to ED from Urgent Care. CT Abd/Pelvis showed cholelithiasis without gallbladder wall thickening, inflammation of pancreas, or diverticulitis. He was treated with fluids and antiemetics. 10/28/16: Returned to ED with persistent symptoms. Viral gastroenteritis was suspected. Treated with IV fluids. Nausea relieved with Prochlorperazine which he was prescribed but unable to obtain.  In clinic, he was afebrile, hypertensive with BP 226/122 on repeat 182/84, tachycardic with HR 101, and actively vomiting. Labs remarkable for mildly elevated WBC 11.8, mildly elevated ALK phos 149, hemoglobin 12 (baseline), and Creatinine 2.17 with baseline ~2. Lipase and LFTs WNL.   Meds:  Current Meds  Medication Sig  . allopurinol (ZYLOPRIM) 100 MG tablet Take 2 tablets (200 mg total) by mouth daily.  . Insulin Pen Needle (PEN NEEDLES 3/16") 31G X 5 MM MISC Use five times daily  . Lancet Devices (ACCU-CHEK SOFTCLIX) lancets Use to check blood sugars 5 times a day. Dx code:E11.9. Insulin dependent.  Marland Kitchen oxyCODONE-acetaminophen (PERCOCET/ROXICET) 5-325 MG tablet Take 1-2 tablets by mouth every 6 (six) hours as needed for severe pain.     Allergies: Allergies as of 10/31/2016  . (No  Known Allergies)   Past Medical History:  Diagnosis Date  . Acute venous embolism and thrombosis of deep vessels of proximal lower extremity ([place]) 07/19/2011  . Anemia   . Chest pain, neg MI, normal coronaries by cath 02/18/2013  . CKD (chronic kidney disease) stage 3, GFR 30-59 ml/min ([facility]) 02/19/2013  . Colon polyps   . Diabetic ulcer of right foot ([place])   . DVT (deep venous thrombosis) ([place]) 09/2002   patient reports additional DVTs in '06 & '11 (unconfirmed)  . GERD (gastroesophageal  reflux disease)   . Hyperlipidemia 02/19/2013  . Hypertension   . Nephrotic syndrome   . Obesity    BMI 44, weight 346 pounds 01/30/14  . Pulmonary embolism ([place]) 09/2002   treated with 6 months of warfarin  . Type I diabetes mellitus ([place]) dx'd 2001    Family History: Denies gastrointestinal history in his family.  Family History  Problem Relation Age of Onset  . Heart disease Mother   . Diabetes Mother   . Diabetes Maternal Uncle   . Diabetes Cousin     Social History: . He denies tobacco use, reports social alcohol use, and denies illicit drug use including marijuana, cocaine, or IV drugs  Review of Systems: A complete ROS was negative except as per HPI.   Physical Exam: Blood pressure (!) 211/92, pulse (!) 106, temperature 98.6 F (37 C), temperature source Oral, resp. rate 18, height 6' 2"  (1.88 m), weight (!) 354 lb 8 oz (160.8 kg), SpO2 98 %.   General: Sitting in chair, NAD HEENT: [place]/AT, EOMI, no scleral icterus, PERRL Cardiac: Tachycardic, regular rhythm, No R/M/G appreciated Pulm: normal effort, CTAB Abd: obese, soft, TTP LUQ, non distended, BS normal Ext: Right AKA, +2 peripheral edema of left lower extremity Neuro: alert and oriented X3, cranial nerves II-XII grossly intact   EKG: personally reviewed my interpretation is sinus tachycardia   CXR: none to review  Assessment & Plan by Problem: Principal Problem:   Intractable nausea and vomiting Active Problems:   Essential hypertension  Nausea and Vomiting Unclear etiology at this time. The patient is afebrile, with mild leukocytosis and benign abdominal exam. Lipase is within normal limits. Differential includes gastroenteritis, GERD, gastroparesis. CT scan of the abdomen on 10/17 revealed cholelithiasis, but no acute intraabdominal process that could account for the patients symptoms. Symptoms not consistient with gall bladder disease. Patient does not appear dry on exam, creatinine near baseline. Will hold  off fluids as patient appears volume overloaded with peripheral edema.  -IV antiemetics  -GI consult in AM to consider expedited EGD, gastric emptying study -BMET, CBC in AM  HTN Not well controlled and hypertensive in the setting of N/V and not taking medications due to decrease PO intake. BP on admission 211/92. Will restart some home meds if tolerating PO intake with goal of 102 systolic.  -Amlodipine 5 mg -Clonidine 0.2 mg -IV Lasix 40 mg x1 -if systolic over 725 consistently can give home Carvedilol 25 mg or small dose of IV labetalol   Type 2 Diabetes Well controlled, A1c 6.3 today. Home regimen includesTresiba 140 units daily, Novolog 25 units TID, and Linagliptin 5 mg daily. He has not been taking this in the setting of decreased PO intake.  -Lantus 70 units -SSI moderate coverage -CBG Q4 hours   Hx of DVT and PE on chronic anticoagulation -Lovenox 160 mg Q12   Dispo: Admit patient to Observation with expected length of stay less than 2 midnights.  SignedAggie Hacker,  Hulen Shouts, MD 10/31/2016, 5:28 PM  Pager: [phone]

## 2016-10-31 NOTE — Assessment & Plan Note (Addendum)
Patient with 2 weeks intractable nausea and vomiting with inability to maintain adequate nutrition/hydration or to take his medications. Differential includes upper GI obstruction/stenosis of esophagus, Gastroparesis, gallbladder disease, or pancreatitis (recent lipases have been normal). Given his persistent symptoms, I do think he warrants admission for symptomatic management and further workup if indicated. He is hypertensive in clinic today in the setting of inability to take his Clonidine, Amlodipine, Coreg, Lasix, or Metolazone. I have discussed the case with our on call admitting team who have agreed to evaluate patient for admission. - STAT CBC, CMET, Lipase - Admit to med-surg bed for IV antiemetics and IV fluid resuscitation - Consider expedited EGD; Gastric emptying study if able to tolerate

## 2016-10-31 NOTE — Progress Notes (Signed)
Report called to Ginger, RN [phone].  Patient is alert and oriented.  Transported via wheelchair to [address].  Sander Nephew, RN 10/31/2016 2:18 PM.

## 2016-11-01 ENCOUNTER — Telehealth: Payer: Self-pay

## 2016-11-01 DIAGNOSIS — E1022 Type 1 diabetes mellitus with diabetic chronic kidney disease: Secondary | ICD-10-CM

## 2016-11-01 DIAGNOSIS — E1065 Type 1 diabetes mellitus with hyperglycemia: Secondary | ICD-10-CM | POA: Diagnosis not present

## 2016-11-01 DIAGNOSIS — R10816 Epigastric abdominal tenderness: Secondary | ICD-10-CM

## 2016-11-01 DIAGNOSIS — R112 Nausea with vomiting, unspecified: Secondary | ICD-10-CM | POA: Diagnosis not present

## 2016-11-01 DIAGNOSIS — N183 Chronic kidney disease, stage 3 (moderate): Secondary | ICD-10-CM

## 2016-11-01 DIAGNOSIS — E669 Obesity, unspecified: Secondary | ICD-10-CM

## 2016-11-01 DIAGNOSIS — G43A1 Cyclical vomiting, intractable: Secondary | ICD-10-CM

## 2016-11-01 DIAGNOSIS — Z86711 Personal history of pulmonary embolism: Secondary | ICD-10-CM

## 2016-11-01 DIAGNOSIS — Z79899 Other long term (current) drug therapy: Secondary | ICD-10-CM

## 2016-11-01 DIAGNOSIS — R12 Heartburn: Secondary | ICD-10-CM | POA: Diagnosis not present

## 2016-11-01 DIAGNOSIS — E8770 Fluid overload, unspecified: Secondary | ICD-10-CM | POA: Diagnosis present

## 2016-11-01 DIAGNOSIS — I252 Old myocardial infarction: Secondary | ICD-10-CM | POA: Diagnosis not present

## 2016-11-01 DIAGNOSIS — E1051 Type 1 diabetes mellitus with diabetic peripheral angiopathy without gangrene: Secondary | ICD-10-CM | POA: Diagnosis not present

## 2016-11-01 DIAGNOSIS — Z7901 Long term (current) use of anticoagulants: Secondary | ICD-10-CM | POA: Diagnosis not present

## 2016-11-01 DIAGNOSIS — I129 Hypertensive chronic kidney disease with stage 1 through stage 4 chronic kidney disease, or unspecified chronic kidney disease: Secondary | ICD-10-CM

## 2016-11-01 DIAGNOSIS — Z9889 Other specified postprocedural states: Secondary | ICD-10-CM | POA: Diagnosis not present

## 2016-11-01 DIAGNOSIS — D72829 Elevated white blood cell count, unspecified: Secondary | ICD-10-CM | POA: Diagnosis not present

## 2016-11-01 DIAGNOSIS — E1043 Type 1 diabetes mellitus with diabetic autonomic (poly)neuropathy: Secondary | ICD-10-CM | POA: Diagnosis not present

## 2016-11-01 DIAGNOSIS — Z23 Encounter for immunization: Secondary | ICD-10-CM | POA: Diagnosis not present

## 2016-11-01 DIAGNOSIS — R933 Abnormal findings on diagnostic imaging of other parts of digestive tract: Secondary | ICD-10-CM | POA: Diagnosis not present

## 2016-11-01 DIAGNOSIS — R000 Tachycardia, unspecified: Secondary | ICD-10-CM

## 2016-11-01 DIAGNOSIS — Z6841 Body Mass Index (BMI) 40.0 and over, adult: Secondary | ICD-10-CM | POA: Diagnosis not present

## 2016-11-01 DIAGNOSIS — K8020 Calculus of gallbladder without cholecystitis without obstruction: Secondary | ICD-10-CM | POA: Diagnosis not present

## 2016-11-01 DIAGNOSIS — E10649 Type 1 diabetes mellitus with hypoglycemia without coma: Secondary | ICD-10-CM | POA: Diagnosis not present

## 2016-11-01 DIAGNOSIS — Z87441 Personal history of nephrotic syndrome: Secondary | ICD-10-CM | POA: Diagnosis not present

## 2016-11-01 DIAGNOSIS — Z86010 Personal history of colonic polyps: Secondary | ICD-10-CM

## 2016-11-01 DIAGNOSIS — E119 Type 2 diabetes mellitus without complications: Secondary | ICD-10-CM | POA: Diagnosis not present

## 2016-11-01 DIAGNOSIS — Z794 Long term (current) use of insulin: Secondary | ICD-10-CM | POA: Diagnosis not present

## 2016-11-01 DIAGNOSIS — Z833 Family history of diabetes mellitus: Secondary | ICD-10-CM | POA: Diagnosis not present

## 2016-11-01 DIAGNOSIS — K222 Esophageal obstruction: Secondary | ICD-10-CM

## 2016-11-01 DIAGNOSIS — Z888 Allergy status to other drugs, medicaments and biological substances status: Secondary | ICD-10-CM | POA: Diagnosis not present

## 2016-11-01 DIAGNOSIS — Z89611 Acquired absence of right leg above knee: Secondary | ICD-10-CM

## 2016-11-01 DIAGNOSIS — Z8249 Family history of ischemic heart disease and other diseases of the circulatory system: Secondary | ICD-10-CM | POA: Diagnosis not present

## 2016-11-01 DIAGNOSIS — E10319 Type 1 diabetes mellitus with unspecified diabetic retinopathy without macular edema: Secondary | ICD-10-CM | POA: Diagnosis not present

## 2016-11-01 DIAGNOSIS — Z86718 Personal history of other venous thrombosis and embolism: Secondary | ICD-10-CM

## 2016-11-01 DIAGNOSIS — K219 Gastro-esophageal reflux disease without esophagitis: Secondary | ICD-10-CM | POA: Diagnosis not present

## 2016-11-01 LAB — CBC
HCT: 34.3 % — ABNORMAL LOW (ref 39.0–52.0)
Hemoglobin: 10.9 g/dL — ABNORMAL LOW (ref 13.0–17.0)
MCH: 27.8 pg (ref 26.0–34.0)
MCHC: 31.8 g/dL (ref 30.0–36.0)
MCV: 87.5 fL (ref 78.0–100.0)
PLATELETS: 318 10*3/uL (ref 150–400)
RBC: 3.92 MIL/uL — AB (ref 4.22–5.81)
RDW: 14.4 % (ref 11.5–15.5)
WBC: 11.3 10*3/uL — ABNORMAL HIGH (ref 4.0–10.5)

## 2016-11-01 LAB — GLUCOSE, CAPILLARY
GLUCOSE-CAPILLARY: 123 mg/dL — AB (ref 65–99)
GLUCOSE-CAPILLARY: 164 mg/dL — AB (ref 65–99)
Glucose-Capillary: 115 mg/dL — ABNORMAL HIGH (ref 65–99)
Glucose-Capillary: 172 mg/dL — ABNORMAL HIGH (ref 65–99)
Glucose-Capillary: 157 mg/dL — ABNORMAL HIGH (ref 65–99)
Glucose-Capillary: 182 mg/dL — ABNORMAL HIGH (ref 65–99)

## 2016-11-01 LAB — BASIC METABOLIC PANEL
Anion gap: 10 (ref 5–15)
BUN: 33 mg/dL — AB (ref 6–20)
CO2: 22 mmol/L (ref 22–32)
Calcium: 8.4 mg/dL — ABNORMAL LOW (ref 8.9–10.3)
Chloride: 103 mmol/L (ref 101–111)
Creatinine, Ser: 2.15 mg/dL — ABNORMAL HIGH (ref 0.61–1.24)
GFR calc non Af Amer: 37 mL/min — ABNORMAL LOW (ref >60)
GFR, EST AFRICAN AMERICAN: 43 mL/min — AB (ref >60)
GLUCOSE: 132 mg/dL — AB (ref 65–99)
Potassium: 3.7 mmol/L (ref 3.5–5.1)
Sodium: 135 mmol/L (ref 135–145)

## 2016-11-01 LAB — HIV ANTIBODY (ROUTINE TESTING W REFLEX): HIV Screen 4th Generation wRfx: NONREACTIVE

## 2016-11-01 LAB — MRSA PCR SCREENING: MRSA by PCR: NEGATIVE

## 2016-11-01 MED ORDER — INSULIN ASPART 100 UNIT/ML ~~LOC~~ SOLN
0.0000 [IU] | SUBCUTANEOUS | Status: DC
  Administered 2016-11-01 – 2016-11-02 (×3): 3 [IU] via SUBCUTANEOUS
  Administered 2016-11-02: 8 [IU] via SUBCUTANEOUS

## 2016-11-01 MED ORDER — CARVEDILOL 25 MG PO TABS
25.0000 mg | ORAL_TABLET | Freq: Two times a day (BID) | ORAL | Status: DC
  Administered 2016-11-01 – 2016-11-02 (×2): 25 mg via ORAL
  Filled 2016-11-01 (×2): qty 1

## 2016-11-01 MED ORDER — ENOXAPARIN SODIUM 150 MG/ML ~~LOC~~ SOLN
1.0000 mg/kg | Freq: Two times a day (BID) | SUBCUTANEOUS | Status: AC
  Administered 2016-11-02: 160 mg via SUBCUTANEOUS
  Filled 2016-11-01 (×2): qty 1.07

## 2016-11-01 NOTE — Progress Notes (Signed)
   Subjective:  Patient seen and examined. He did not have any episodes of vomiting over night and was able to tolerate PO medications. Had one loose bowel movement this AM, no melena or blood. He feels improved from admission and denies abdominal pain. His concern this AM is that his urine is still dark and tea colored. He has been able to tolerate sips of water since admission. Reassured the patient that he was not severely dehydrated and actually volume overloaded. Encouraged him to continue to drink water.   Objective:  Vital signs in last 24 hours: Vitals:   10/31/16 1703 10/31/16 2237 11/01/16 0418 11/01/16 0920  BP: (!) 211/92 (!) 166/66 (!) 147/86 (!) 162/78  Pulse: (!) 106 (!) 103 96 88  Resp: 18 17 17 18   Temp: 98.6 F (37 C) 99.1 F (37.3 C) 98.7 F (37.1 C) 98.6 F (37 C)  TempSrc: Oral   Oral  SpO2: 98% 98% 99% 98%  Weight:  (!) 352 lb 11.8 oz (160 kg)    Height:       General: Laying in bed comfortably, NAD HEENT: [place]/AT, EOMI, no scleral icterus Cardiac: RRR, No R/M/G appreciated Pulm: normal effort, CTAB Abd: soft, mild TTP to epigastric region, non distended, BS normal Ext: right AKA, +1 peripheral edema of left lower extremity Neuro: alert and oriented X3, cranial nerves II-XII grossly intact   Assessment/Plan:  Principal Problem:   Intractable nausea and vomiting Active Problems:   Essential hypertension  Nausea and Vomiting Unclear etiology at this time but most likely gastroparesis. Patient able to tolerate PO medicines and fluids. Patient does not appear dry on exam, creatinine near baseline. Will continue to hold off fluids at this time. BMET and CBC stable. Cr 2.15, WBC 11.3.  -Gastric emptying study -- d/c antiemetics, NPO after midnight -GI consulted regarding EGD -- follow up recs  HTN BP 162/78, improved from admission.  -Amlodipine 5 mg -Clonidine 0.2 mg -Start Carvedilol 25 mg BID   Type 2 Diabetes  Home regimen includesTresiba 140  units daily, Novolog 25 units TID, and Linagliptin 5 mg daily. BGs in goal range after starting Lantus 70.  -Lantus 70 units -SSI moderate coverage -CBG Q4 hours  -NPO after midnight  Dispo: Anticipated discharge in approximately 1-2 day(s).   Melanee Spry, MD 11/01/2016, 11:29 AM Pager: [phone]

## 2016-11-01 NOTE — Telephone Encounter (Signed)
EGD for 11/10/16 cancelled and Dr. Havery Moros notified.

## 2016-11-01 NOTE — Progress Notes (Signed)
Initial Nutrition Assessment  DOCUMENTATION CODES:   Morbid obesity  INTERVENTION:   -Recommend diet as tolerated to Carb Mod. If N/V related to gastroparesis, recommend diet lower in fiber and fat as well  -Continue Boost Breeze po TID, each supplement provides 250 kcal and 9 grams of protein   NUTRITION DIAGNOSIS:   Inadequate oral intake related to acute illness, nausea, vomiting as evidenced by per patient/family report.  GOAL:   Patient will meet greater than or equal to 90% of their needs  MONITOR:   PO intake, Supplement acceptance, Diet advancement, Labs, Weight trends  REASON FOR ASSESSMENT:   Malnutrition Screening Tool    ASSESSMENT:    41 yo male admitted with intractabke N/V with unclear etiology, possibly due to gastroparesis. Pt with hx of HTN, DM, CKD III, s/p right BKA  that required revision to AKA (2016)   Noted gastric emptying study pending.   Pt tolerating CL diet; nausea improving, no vomiting. Pt reports minimal po intake due to N/V and eating mostly liquids for the past 2 weeks. Not able to keep down solid food, liquids or meds (not taking meds for 2 weeks). Pt reports vomiting shortly after eating foods.   Pt reports 30 pound wt loss in the past 2-3 weeks; 7.9% wt loss. Per weight encounters, pt with 12.4% wt loss since August (significant for time frame)  Nutrition-Focused physical exam completed. Findings are WDL for fat depletion and muscle depletion.  Mild edema noted.   Labs: reviewed Meds: reviewed   Diet Order:  Diet clear liquid Room service appropriate? Yes; Fluid consistency: Thin Diet NPO time specified  Skin:  Reviewed, no issues  Last BM:  10/24  Height:   Ht Readings from Last 1 Encounters:  10/31/16 6\' 2"  (1.88 m)    Weight:   Wt Readings from Last 1 Encounters:  10/31/16 (!) 352 lb 11.8 oz (160 kg)    Ideal Body Weight:  81.4 kg  BMI:  Body mass index is 45.29 kg/m.  Estimated Nutritional Needs:   Kcal:   2400-2600 kcals  Protein:  120-130 g  Fluid:  >/= 2 L  EDUCATION NEEDS:   No education needs identified at this time  [place], [place], LDN [phone] Pager  [phone] Weekend/On-Call Pager

## 2016-11-01 NOTE — Telephone Encounter (Signed)
-----   Message from Levin Erp, [place] sent at 11/01/2016  2:46 PM EDT ----- Regarding: please cancel EGD with Dr. Serita Butcher Nov 2nd Patient is scheduled for EGD nov 2nd with Dr. Serita Butcher, but he is having one tomorrow in hospital, please cancel and let Dr. Luvenia Starch know. Thanks-JLL

## 2016-11-01 NOTE — Consult Note (Signed)
Consultation  Referring Provider: Dr. Heber Los Osos     Primary Care Physician:  [name], DO Primary Gastroenterologist: Dr. Havery Moros        Reason for Consultation: Intractable nausea and vomiting x2 weeks       Impression / Plan:   Impression: 1. Intractable nausea/vomiting: for 2 weeks, controlled on meds here, unable to tolerate Metoclopramide in the past per ER notes; Consider outlet obstruction vs gastroparesis vs other 2. DM type 1: multiple complications from this being uncontrolled in the past-consider gastroparesis as well though recent hgb A1c on 10/31/16 was 6.3 3. Esophageal thickening: seen on recent CT, pt previously scheduled for EGD with Dr. Havery Moros on Nov 2nd  Plan: 1. Will proceed with EGD tomorrow at 12:45, will hold Lovenox tomorrow morning in anticipation of this. Last dose will be 10 pm tonight. 2. Will cancel gastric emptying study for now and possibly order in the future, if EGD negative tomorrow, as this would not change plans at the moment 3. Continue other supportive measures including anti-emetics today 4. Have cancelled upcoming outpatient EGD with Dr. Havery Moros 5. Please await any further recommendations from Dr. Carlean Purl later today  Thank you for your kind consultation, we will continue to follow.  [name]  11/01/2016, 1:45 PM Pager #: [phone]           [facility] Attending   I have taken an interval history, reviewed the chart and examined the patient. I agree with the Advanced Practitioner's note, impression and recommendations.   He gives a story of 2 weeks of symptoms that started suddenly.  He certainly is a set up for gastroparesis but does not seem to have chronic recurrent symptoms that I can tell from chart review.  He is not the best historian but I wonder if he did not have some sort of a gastroenteritis and a prolonged reaction.  I understand that urged to do a gastric emptying study but in the  setting of this acute illness plus hospitalization those often do not proved to be too useful.  I think ruling out some sort of gastric outlet obstruction makes sense.  EGD will do that.  The esophageal thickening is likely reflux change etc.   He had a dysphoric reaction to metoclopramide in the emergency department earlier this month and cannot take that.  Domperidone might be an option but that is a drug available in [place] and at certain pharmacies in this country but is not easily obtainable.  Fully he will settle down with supportive care and will need anything further done.  One thing to consider would be to workup his gallbladder for a calculus cholecystitis, he had that done in 2016 as well.  I doubt this is a gallbladder problem but with the neuropathy that he has sometimes it can be difficult to sort that out.  Note that he was febrile at one point throughout this again suggesting infectious etiology.  I appreciate the opportunity to care for this patient. Victor Mayer, MD, [facility] [phone] (pager) 11/01/2016 7:16 PM      [name] is a 41 y.o. male with past medical history as listed below including DM type 1, s/p right AKA 2016, CKD stage 3, DVT, Pulmonary embolism on Eliquis and obesity, who presented to the ER on 10/31/16 with continued complaint of nausea and vomiting.    Per review of chart, patient has been seen multiple times in the ER for continued complaints  of nausea and vomiting over the past few days/weeks. At time of admission, patient reported being unable to tolerate oral intake or medications.  He has been ordered a Gastric emptying study in the setting of multiple other complications from uncontrolled DM. There has been discussion about scheduling him for sooner EGD while he is hospitalized.    Today, the patient is found comfortably sleeping in his room with his mother by his bedside. He awakens easily and tells me that he was having no  symptoms of nausea/vomiting when he saw Amy in the office, in fact this started "out of the blue" about 2 weeks ago. Patient describes constant nausea and only vomiting, "when I try to eat something".  Patient has been unable to tolerate food, medications or liquids over the past 2 weeks. He denies hematemesis.   Patient continues to describe abdominal pain, consistent with that at the time of OV below, though patient tells me this now hurts at more times throughout the day now, typically when he vomits and sometimes in between, both in his epigastrum and b/l lower quadrants.   Patient denies fever, chills, change in bowel habits, heartburn or reflux.  GI History: 09/14/16-OV, Amy Esterwood, PA-C: seen for abdominal pain and abnormal imaging, abdominal pain x couple of years-noticed pain with doing crunches, pressure-like, trying to lose weight, abnormal Ct and U/s reviewed (below); Assessment: abdominal pain thought MSK, pt scheduled for EGD Nov 2 with Dr. Havery Moros for esophageal thickening, mngmt for fatty liver discussed June 2018- Abdominal U/S: enlarged liver, no changes suggesting parenchymal disorder, CBD 6 mm echogenic material within the gallbladder gallstones not excluded prominent spleen Feb 2018- CT Abd/Pelvis w/o IV contrast: hepatic steatosis, layering gallstones or sludge, no cholecystic inflammation, spleen read as normal, increased stool burden and mild esophageal wall thickening July 2017- Colonoscopy, Dr. Lyda Jester: for change in bowel habits; Impression: negative except for 5 mm polyp-tubular adenoma  Past Medical History:  Diagnosis Date  . Acute venous embolism and thrombosis of deep vessels of proximal lower extremity ([place]) 07/19/2011  . Anemia   . Chest pain, neg MI, normal coronaries by cath 02/18/2013  . CKD (chronic kidney disease) stage 3, GFR 30-59 ml/min ([facility]) 02/19/2013  . Colon polyps   . Diabetic ulcer of right foot ([place])   . DVT (deep venous thrombosis) ([place]) 09/2002    patient reports additional DVTs in '06 & '11 (unconfirmed)  . GERD (gastroesophageal reflux disease)   . History of blood transfusion    "related to OR" (10/31/2016)  . Hyperlipidemia 02/19/2013  . Hypertension   . Nausea & vomiting    "constant for the last couple weeks" (10/31/2016)  . Nephrotic syndrome   . Obesity    BMI 44, weight 346 pounds 01/30/14  . Pulmonary embolism ([place]) 09/2002   treated with 6 months of warfarin  . Type I diabetes mellitus ([place]) dx'd 2001    Past Surgical History:  Procedure Laterality Date  . AMPUTATION Right 12/22/2013   Procedure: AMPUTATION BELOW KNEE;  Surgeon: [name], MD;  Location: [facility];  Service: Orthopedics;  Laterality: Right;  . AMPUTATION Right 02/19/2014   Procedure: RIGHT ABOVE KNEE AMPUTATION ;  Surgeon: [name], MD;  Location: [facility];  Service: Orthopedics;  Laterality: Right;  . CARDIAC CATHETERIZATION  02/18/2013   normal coronaries  . I&D EXTREMITY Right 12/14/2013   Procedure: IRRIGATION AND DEBRIDEMENT RIGHT FOOT;  Surgeon: [name], MD;  Location: [facility];  Service: Orthopedics;  Laterality: Right;  .  INCISION AND DRAINAGE ABSCESS  2007; 2015   "back"  . INCISION AND DRAINAGE OF WOUND Right 12/22/2013   Procedure: I&D RIGHT BUTTOCK;  Surgeon: [name], MD;  Location: [facility];  Service: Orthopedics;  Laterality: Right;  . LEFT HEART CATHETERIZATION WITH CORONARY ANGIOGRAM N/A 02/18/2013   Procedure: LEFT HEART CATHETERIZATION WITH CORONARY ANGIOGRAM;  Surgeon: [name], MD;  Location: [facility] C[facility] LAB;  Service: Cardiovascular;  Laterality: N/A;    Family History  Problem Relation Age of Onset  . Heart disease Mother   . Diabetes Mother   . Diabetes Maternal Uncle   . Diabetes Cousin      Social History  Substance Use Topics  . Smoking status: Never Smoker  . Smokeless tobacco: Never Used  . Alcohol use Yes     Comment: 10/31/2016 "a few beers q 6 months or so"    Prior to Admission medications     Medication Sig Start Date End Date Taking? Authorizing Provider  allopurinol (ZYLOPRIM) 100 MG tablet Take 2 tablets (200 mg total) by mouth daily. 08/03/16  Yes Kalman Shan Ratliff, DO  Insulin Pen Needle (PEN NEEDLES 3/16") 31G X 5 MM MISC Use five times daily 08/14/16  Yes Hoffman, Elza Rafter, DO  Lancet Devices (ACCU-CHEK [facility]) lancets Use to check blood sugars 5 times a day. Dx code:E11.9. Insulin dependent. 08/14/16  Yes Hoffman, Jessica Ratliff, DO  oxyCODONE-acetaminophen (PERCOCET/ROXICET) 5-325 MG tablet Take 1-2 tablets by mouth every 6 (six) hours as needed for severe pain. 10/25/16  Yes Montine Circle, PA-C  Alpha-Lipoic Acid 200 MG CAPS Take 1 capsule (200 mg total) by mouth daily. 08/17/16   Kalman Shan Ratliff, DO  amLODipine (NORVASC) 5 MG tablet Take 1 tablet (5 mg total) by mouth daily. 08/17/16 08/17/17  Kalman Shan Ratliff, DO  apixaban (ELIQUIS) 5 MG TABS tablet Take 1 tablet (5 mg total) by mouth 2 (two) times daily. 08/11/16   Axel Filler, MD  Blood Glucose Monitoring Suppl (ACCU-CHEK AVIVA) device Use to check blood sugar 5 times daily. Diag code E10.51. Multiple insulin dependent 08/11/16 08/11/17  Axel Filler, MD  calcitRIOL (ROCALTROL) 0.25 MCG capsule Take 1 capsule (0.25 mcg total) by mouth daily. 08/11/16   Axel Filler, MD  cloNIDine (CATAPRES) 0.2 MG tablet Take 1 tablet (0.2 mg total) by mouth 2 (two) times daily. 08/17/16 08/17/17  Kalman Shan Ratliff, DO  furosemide (LASIX) 40 MG tablet Take 2 tablets (80 mg total) by mouth 2 (two) times daily. 08/11/16   Axel Filler, MD  gabapentin (NEURONTIN) 300 MG capsule Take 1 capsule (300 mg total) by mouth 3 (three) times daily. 08/11/16   Axel Filler, MD  glucose blood (ACCU-CHEK AVIVA) test strip Use to check blood sugars 5 times a day. Dx code:E11.9. Insulin dependent. 08/14/16   [name], DO  HYDROcodone-acetaminophen (NORCO) 5-325 MG tablet Take 1  tablet by mouth every 4 (four) hours as needed for moderate pain. 42/68/34   Delora Fuel, MD  insulin aspart (NOVOLOG) 100 UNIT/ML FlexPen Inject 25 Units into the skin 3 (three) times daily. 08/11/16 08/11/17  Axel Filler, MD  Insulin Degludec 200 UNIT/ML SOPN Inject 140 Units into the skin daily. 08/11/16   Axel Filler, MD  linagliptin (TRADJENTA) 5 MG TABS tablet Take 1 tablet (5 mg total) by mouth daily. 08/11/16   Axel Filler, MD  loratadine (CLARITIN) 10 MG tablet Take 10 mg by mouth daily as needed for allergies.  [provider]  lovastatin (MEVACOR) 20 MG tablet Take 1 tablet (20 mg total) by mouth daily. 06/08/16 06/08/17  Velna Ochs, MD  metolazone (ZAROXOLYN) 2.5 MG tablet Take 2.5 mg by mouth every other day.    [provider]  ondansetron (ZOFRAN) 4 MG tablet Take 4 mg by mouth every 6 (six) hours as needed for nausea or vomiting.     [provider]  polyethylene glycol (MIRALAX / GLYCOLAX) packet Take 17 g by mouth daily as needed for mild constipation.     [provider]  prochlorperazine (COMPAZINE) 10 MG tablet Take 1 tablet (10 mg total) by mouth every 6 (six) hours as needed for nausea or vomiting. 93/79/02   Delora Fuel, MD  prochlorperazine (COMPAZINE) 25 MG suppository Place 1 suppository (25 mg total) rectally every 12 (twelve) hours as needed for nausea or vomiting. 40/97/35   Delora Fuel, MD  Zinc Gluconate 100 MG TABS Take 2.2 tablets (220 mg total) by mouth daily. 08/17/16   [name], DO    Current Facility-Administered Medications  Medication Dose Route Frequency Provider Last Rate Last Dose  . acetaminophen (TYLENOL) tablet 650 mg  650 mg Oral Q6H PRN Alphonzo Grieve, MD       Or  . acetaminophen (TYLENOL) suppository 650 mg  650 mg Rectal Q6H PRN Alphonzo Grieve, MD      . amLODipine (NORVASC) tablet 5 mg  5 mg Oral Daily Alphonzo Grieve, MD   5 mg at 11/01/16 0904  . carvedilol  (COREG) tablet 25 mg  25 mg Oral BID WC Lacroce, Hulen Shouts, MD      . cloNIDine (CATAPRES) tablet 0.2 mg  0.2 mg Oral BID Alphonzo Grieve, MD   0.2 mg at 11/01/16 0904  . enoxaparin (LOVENOX) injection 160 mg  1 mg/kg Subcutaneous Q12H Wynell Balloon, RPH   160 mg at 11/01/16 1233  . feeding supplement (BOOST / RESOURCE BREEZE) liquid 1 Container  1 Container Oral TID BM Hoffman, Erik C, DO      . insulin aspart (novoLOG) injection 0-15 Units  0-15 Units Subcutaneous Q4H Lucious Groves, DO   3 Units at 11/01/16 1233  . insulin glargine (LANTUS) injection 70 Units  70 Units Subcutaneous QHS Alphonzo Grieve, MD   70 Units at 10/31/16 2140    Allergies as of 10/31/2016  . (No Known Allergies)     Review of Systems:    Constitutional: No fever or chills Skin: No rash  Cardiovascular: No chest pain Respiratory: No SOB Gastrointestinal: See HPI and otherwise negative Genitourinary: No dysuria Neurological: No headache Musculoskeletal: No new muscle or joint pain Hematologic: No bleeding Psychiatric: No history of depression or anxiety   Physical Exam:  Vital signs in last 24 hours: Temp:  [98.6 F (37 C)-99.1 F (37.3 C)] 98.6 F (37 C) (10/24 0920) Pulse Rate:  [88-106] 88 (10/24 0920) Resp:  [17-20] 18 (10/24 0920) BP: (147-223)/(66-100) 162/78 (10/24 0920) SpO2:  [98 %-100 %] 98 % (10/24 0920) Weight:  [352 lb 11.8 oz (160 kg)-354 lb 8 oz (160.8 kg)] 352 lb 11.8 oz (160 kg) (10/23 2237) Last BM Date: 11/01/16 General:   Pleasant morbidly obese AA male appears to be in NAD, Well developed, Well nourished, alert and cooperative Head:  Normocephalic and atraumatic. Eyes:   PEERL, EOMI. No icterus. Conjunctiva pink. Ears:  Normal auditory acuity. Neck:  Supple Throat: Oral cavity and pharynx without inflammation, swelling or lesion.  Lungs: Respirations even and  unlabored. Lungs clear to auscultation bilaterally.   No wheezes, crackles, or rhonchi.  Heart: Normal S1, S2. No  MRG. Regular rate and rhythm. No peripheral edema, cyanosis or pallor.  Abdomen:  Soft, nondistended, mild epigastric and b/l lower quadrant ttp. No rebound or guarding. Normal bowel sounds. No appreciable masses or hepatomegaly. Rectal:  Not performed.  Msk:  Right AKA Extremities:  Without edema, no deformity or joint abnormality.  Neurologic:  Alert and  oriented x4;  grossly normal neurologically.   Skin:   Dry and intact without significant lesions or rashes. Psychiatric: Demonstrates good judgement and reason without abnormal affect or behaviors.   LAB RESULTS:  Recent Labs  10/31/16 1158 11/01/16 0355  WBC 11.8* 11.3*  HGB 12.0* 10.9*  HCT 37.4* 34.3*  PLT 336 318   BMET  Recent Labs  10/31/16 1158 11/01/16 0355  NA 139 135  K 4.1 3.7  CL 106 103  CO2 24 22  GLUCOSE 186* 132*  BUN 32* 33*  CREATININE 2.17* 2.15*  CALCIUM 8.8* 8.4*   LFT  Recent Labs  10/31/16 1158  PROT 6.9  ALBUMIN 2.8*  AST 21  ALT 25  ALKPHOS 149*  BILITOT 0.6   PREVIOUS ENDOSCOPIES:            See HPI

## 2016-11-02 ENCOUNTER — Encounter: Payer: Medicare Other | Admitting: Internal Medicine

## 2016-11-02 ENCOUNTER — Encounter (HOSPITAL_COMMUNITY)
Admission: AD | Disposition: A | Discharge status: Home / Self Care | Payer: Self-pay | Source: Ambulatory Visit | Attending: Internal Medicine

## 2016-11-02 ENCOUNTER — Inpatient Hospital Stay (HOSPITAL_COMMUNITY): Payer: Medicare Other | Admitting: Certified Registered"

## 2016-11-02 ENCOUNTER — Encounter (HOSPITAL_COMMUNITY): Payer: Self-pay

## 2016-11-02 DIAGNOSIS — E10649 Type 1 diabetes mellitus with hypoglycemia without coma: Secondary | ICD-10-CM

## 2016-11-02 DIAGNOSIS — Z888 Allergy status to other drugs, medicaments and biological substances status: Secondary | ICD-10-CM

## 2016-11-02 DIAGNOSIS — R12 Heartburn: Secondary | ICD-10-CM

## 2016-11-02 DIAGNOSIS — Z9889 Other specified postprocedural states: Secondary | ICD-10-CM

## 2016-11-02 DIAGNOSIS — Z6841 Body Mass Index (BMI) 40.0 and over, adult: Secondary | ICD-10-CM

## 2016-11-02 HISTORY — PX: ESOPHAGOGASTRODUODENOSCOPY: SHX5428

## 2016-11-02 LAB — CBC
HEMATOCRIT: 33.6 % — AB (ref 39.0–52.0)
HEMOGLOBIN: 10.7 g/dL — AB (ref 13.0–17.0)
MCH: 27.8 pg (ref 26.0–34.0)
MCHC: 31.8 g/dL (ref 30.0–36.0)
MCV: 87.3 fL (ref 78.0–100.0)
Platelets: 289 10*3/uL (ref 150–400)
RBC: 3.85 MIL/uL — AB (ref 4.22–5.81)
RDW: 14 % (ref 11.5–15.5)
WBC: 9 10*3/uL (ref 4.0–10.5)

## 2016-11-02 LAB — GLUCOSE, CAPILLARY
GLUCOSE-CAPILLARY: 103 mg/dL — AB (ref 65–99)
GLUCOSE-CAPILLARY: 67 mg/dL (ref 65–99)
GLUCOSE-CAPILLARY: 80 mg/dL (ref 65–99)
GLUCOSE-CAPILLARY: 296 mg/dL — AB (ref 65–99)
Glucose-Capillary: 113 mg/dL — ABNORMAL HIGH (ref 65–99)

## 2016-11-02 LAB — BASIC METABOLIC PANEL
ANION GAP: 8 (ref 5–15)
BUN: 35 mg/dL — ABNORMAL HIGH (ref 6–20)
CALCIUM: 8.1 mg/dL — AB (ref 8.9–10.3)
CO2: 24 mmol/L (ref 22–32)
Chloride: 103 mmol/L (ref 101–111)
Creatinine, Ser: 2.19 mg/dL — ABNORMAL HIGH (ref 0.61–1.24)
GFR, EST AFRICAN AMERICAN: 42 mL/min — AB (ref >60)
GFR, EST NON AFRICAN AMERICAN: 36 mL/min — AB (ref >60)
Glucose, Bld: 100 mg/dL — ABNORMAL HIGH (ref 65–99)
POTASSIUM: 3.5 mmol/L (ref 3.5–5.1)
SODIUM: 135 mmol/L (ref 135–145)

## 2016-11-02 SURGERY — EGD (ESOPHAGOGASTRODUODENOSCOPY)
Anesthesia: Moderate Sedation

## 2016-11-02 SURGERY — EGD (ESOPHAGOGASTRODUODENOSCOPY)
Anesthesia: Monitor Anesthesia Care

## 2016-11-02 MED ORDER — CARVEDILOL 25 MG PO TABS: 25.0000 mg | ORAL_TABLET | Freq: Two times a day (BID) | ORAL | 5 refills | Status: DC

## 2016-11-02 MED ORDER — PROPOFOL 500 MG/50ML IV EMUL
INTRAVENOUS | Status: DC | PRN
  Administered 2016-11-02: 75 ug/kg/min via INTRAVENOUS

## 2016-11-02 MED ORDER — ONDANSETRON HCL 4 MG/2ML IJ SOLN
INTRAMUSCULAR | Status: AC
  Filled 2016-11-02: qty 2

## 2016-11-02 MED ORDER — SODIUM CHLORIDE 0.9 % IV SOLN
INTRAVENOUS | Status: DC
  Administered 2016-11-02: 10:00:00 via INTRAVENOUS

## 2016-11-02 MED ORDER — APIXABAN 5 MG PO TABS: 5.0000 mg | ORAL_TABLET | Freq: Two times a day (BID) | ORAL | Status: DC

## 2016-11-02 MED ORDER — DEXTROSE 50 % IV SOLN
INTRAVENOUS | Status: AC
  Administered 2016-11-02: 50 mL
  Filled 2016-11-02: qty 50

## 2016-11-02 MED ORDER — PROPOFOL 10 MG/ML IV BOLUS
INTRAVENOUS | Status: DC | PRN
  Administered 2016-11-02 (×2): 10 mg via INTRAVENOUS

## 2016-11-02 MED ORDER — ONDANSETRON HCL 4 MG/2ML IJ SOLN
4.0000 mg | Freq: Once | INTRAMUSCULAR | Status: AC
  Administered 2016-11-02: 4 mg via INTRAVENOUS

## 2016-11-02 MED ORDER — SODIUM CHLORIDE 0.9 % IV SOLN: INTRAVENOUS | Status: DC

## 2016-11-02 MED ORDER — LIDOCAINE HCL (CARDIAC) 20 MG/ML IV SOLN
INTRAVENOUS | Status: DC | PRN
  Administered 2016-11-02: 50 mg via INTRATRACHEAL

## 2016-11-02 MED ORDER — DEXAMETHASONE SODIUM PHOSPHATE 10 MG/ML IJ SOLN
INTRAMUSCULAR | Status: DC | PRN
  Administered 2016-11-02: 4 mg via INTRAVENOUS

## 2016-11-02 NOTE — Discharge Summary (Signed)
Name: [name] MRN: [id number] DOB: [birth year] 41 y.o. PCP: [name], DO  Date of Admission: 10/31/2016  2:53 PM Date of Discharge: 11/02/2016 Attending Physician: Dr. Angelia Mould  Discharge Diagnosis: 1. Nausea/vomiting Principal Problem:   Intractable nausea and vomiting Active Problems:   DM (diabetes mellitus) type I uncontrolled, periph vascular disorder (HCC)   Morbid obesity (HCC)   Essential hypertension   CKD (chronic kidney disease) stage 3, GFR 30-59 ml/min (HCC)   Above knee amputation of right lower extremity ([place])  Discharge Medications: Allergies as of 11/02/2016      Reactions   Reglan [metoclopramide] Other (See Comments)   Dysphoric reaction      Medication List    STOP taking these medications   HYDROcodone-acetaminophen 5-325 MG tablet Commonly known as:  NORCO   ondansetron 4 MG tablet Commonly known as:  ZOFRAN   oxyCODONE-acetaminophen 5-325 MG tablet Commonly known as:  PERCOCET/ROXICET   prochlorperazine 10 MG tablet Commonly known as:  COMPAZINE   prochlorperazine 25 MG suppository Commonly known as:  COMPAZINE     TAKE these medications   ACCU-CHEK AVIVA device Use to check blood sugar 5 times daily. Diag code E10.51. Multiple insulin dependent   accu-chek softclix lancets Use to check blood sugars 5 times a day. Dx code:E11.9. Insulin dependent.   allopurinol 100 MG tablet Commonly known as:  ZYLOPRIM Take 2 tablets (200 mg total) by mouth daily.   Alpha-Lipoic Acid 200 MG Caps Take 1 capsule (200 mg total) by mouth daily.   amLODipine 5 MG tablet Commonly known as:  NORVASC Take 1 tablet (5 mg total) by mouth daily.   apixaban 5 MG Tabs tablet Commonly known as:  ELIQUIS Take 1 tablet (5 mg total) by mouth 2 (two) times daily.   calcitRIOL 0.25 MCG capsule Commonly known as:  ROCALTROL Take 1 capsule (0.25 mcg total) by mouth daily.   carvedilol 25 MG tablet Commonly known as:  COREG Take 1  tablet (25 mg total) by mouth 2 (two) times daily with a meal.   cloNIDine 0.2 MG tablet Commonly known as:  CATAPRES Take 1 tablet (0.2 mg total) by mouth 2 (two) times daily.   furosemide 40 MG tablet Commonly known as:  LASIX Take 2 tablets (80 mg total) by mouth 2 (two) times daily.   gabapentin 300 MG capsule Commonly known as:  NEURONTIN Take 1 capsule (300 mg total) by mouth 3 (three) times daily.   glucose blood test strip Commonly known as:  ACCU-CHEK AVIVA Use to check blood sugars 5 times a day. Dx code:E11.9. Insulin dependent.   insulin aspart 100 UNIT/ML FlexPen Commonly known as:  NOVOLOG Inject 25 Units into the skin 3 (three) times daily.   Insulin Degludec 200 UNIT/ML Sopn Inject 140 Units into the skin daily.   linagliptin 5 MG Tabs tablet Commonly known as:  TRADJENTA Take 1 tablet (5 mg total) by mouth daily.   loratadine 10 MG tablet Commonly known as:  CLARITIN Take 10 mg by mouth daily as needed for allergies.   lovastatin 20 MG tablet Commonly known as:  MEVACOR Take 1 tablet (20 mg total) by mouth daily.   metolazone 2.5 MG tablet Commonly known as:  ZAROXOLYN Take 2.5 mg by mouth every other day.   Pen Needles 3/16" 31G X 5 MM Misc Use five times daily   polyethylene glycol packet Commonly known as:  MIRALAX / GLYCOLAX Take 17 g by mouth daily as needed for  mild constipation.   Zinc Gluconate 100 MG Tabs Take 2.2 tablets (220 mg total) by mouth daily.       Disposition and follow-up:   [name] was discharged from [facility] in Stable condition.  At the hospital follow up visit please address:  1.  Nausea and vomiting --any further episodes? --f/u with GI for gastroparesis w/u?  T2DM: --confirm dosing --supposed to be on Degludec 140 units daily, novolog 25 units TID wc, and trajenta  HTN: --assess adherence as patient; ensure he does not need refills --supposed to be on carvedilol 25mg  BID,  amlodipine 5mg  daily, clonidine 0.2mg  BID, lasix 80mg  BID and metolazone 2.5mg  qod. --asked patient to hold lasix and metolazone for one day prior to restarting on discharge due to euvolemia on discharge  2.  Labs / imaging needed at time of follow-up: none  3.  Pending labs/ test needing follow-up: none  Follow-up Appointments: Follow-up Information    [place]. Call.   Why:  if you don't hear from the clinic by tomorrow lunch time, please call tomorrow afternoon to make an appointment to be seen for hospital follow up in 1 to 2 weeks. Contact information: [address] [place] [facility] Course by problem list: Principal Problem:   Intractable nausea and vomiting Active Problems:   DM (diabetes mellitus) type I uncontrolled, periph vascular disorder (HCC)   Morbid obesity (HCC)   Essential hypertension   CKD (chronic kidney disease) stage 3, GFR 30-59 ml/min (HCC)   Above knee amputation of right lower extremity (HCC)   Intractable nausea and vomiting: Patient presented with 2 wk h/o nausea and vomiting, with associated mild abdominal pain. Patient had been seen in the ED a couple of times prior to admission for the same and discharged on antiemetics and opioids for his abd pain with no relief. CT abd was obtained at one of these ED visits which did not reveal any acute process. Patient was admitted and treated supportively with anti-emetics. GI was consulted as he was previously scheduled to obtain an outpt EGD for concern for Barrett's esophagus on prior imaging; they were able to perform the EGD during this admission, which did not reveal any acute pathology and no biopsies were taken. After the EGD patient's symptoms resolved and he was tolerating full diet again. He was discharged with close follow up in the clinic. In setting of his N4OE with complications of retinopathy, diabetic ulcer leading to leg  amputation, it would be likely that he has a component of gastroparesis. Consider outpatient gastric emptying study to confirm.  HTN:  On admission, patient was hypertensive to 200s/120s due to inability to tolerate PO intake including his medicines for 2 weeks. On restarting his regimen, his BP became more controlled. Of note, patient was noted to not be taking carvedilol for about a month due to running out and not asking for a refill from [facility] or [facility] who also follows him. His Lasix and metolazone were held during admission due to euvolemia and poor PO intake; he was asked to wait one day prior to restarting these. Discharge medications for HTN include carvedilol 25mg  BID, amlodipine 5mg  daily, clonidine 0.2mg  BID, lasix 80mg  BID and metolazone 2.5mg  qod.   T2DM: Patient with A1c of 6.3 this admission; his home regimen includes degludac 140 units daily and novolog 25 units TID with meals. On admission he endorsed  not taking his insulin for at least a week due to not being able to tolerate PO intake. His CBG on admission was 186. He was started at half dose lantus 70 units daily and sliding scale insulin during hospitalization due to not tolerating PO intake initially. He did have one episode of hypoglycemia to 67 day of discharge. On discharge, he was able to eat his entire dinner; he was advised to monitor his CBGs closely for next day or 2 for recurrent hypoglycemia and to call the clinic if this were to occur so his insulin dosing could be adjusted.   H/o PE on chronic anticoagulation: Patient endorsed not being able to tolerate his PO meds due to vomiting for 2 weeks prior to admission. On admission, he had no signs or symptoms of VTE; he was treated with therapeutic dose lovenox until he was able to tolerate PO intake. On discharge, he was resumed on his Eliquis 5mg  BID.  CKD stage 3: Patient with CKD stage 3; his Cr remained at baseline of 2.1 during hospitalization.   Discharge Vitals:   BP  (!) 130/56   Pulse 88   Temp 98.4 F (36.9 C) (Oral)   Resp 20   Ht 6\' 2"  (1.88 m)   Wt (!) 353 lb (160.1 kg)   SpO2 94%   BMI 45.32 kg/m   Pertinent Labs, Studies, and Procedures:  CBC Latest Ref Rng & Units 11/02/2016 11/01/2016 10/31/2016  WBC 4.0 - 10.5 K/uL 9.0 11.3(H) 11.8(H)  Hemoglobin 13.0 - 17.0 g/dL 10.7(L) 10.9(L) 12.0(L)  Hematocrit 39.0 - 52.0 % 33.6(L) 34.3(L) 37.4(L)  Platelets 150 - 400 K/uL 289 318 336   BMP Latest Ref Rng & Units 11/02/2016 11/01/2016 10/31/2016  Glucose 65 - 99 mg/dL 100(H) 132(H) 186(H)  BUN 6 - 20 mg/dL 35(H) 33(H) 32(H)  Creatinine 0.61 - 1.24 mg/dL 2.19(H) 2.15(H) 2.17(H)  BUN/Creat Ratio 9 - 20 - - -  Sodium 135 - 145 mmol/L 135 135 139  Potassium 3.5 - 5.1 mmol/L 3.5 3.7 4.1  Chloride 101 - 111 mmol/L 103 103 106  CO2 22 - 32 mmol/L 24 22 24   Calcium 8.9 - 10.3 mg/dL 8.1(L) 8.4(L) 8.8(L)   Hgb A1c 10/31/2016 - 6.3 HIV screen 10/31/2016- nonreactive  EGD 11/02/2016: Normal esophagus, normal stomach, normal duodenum - no specimens biopsied.  Discharge Instructions: Discharge Instructions    Call MD for:  difficulty breathing, headache or visual disturbances    Complete by:  As directed    Call MD for:  extreme fatigue    Complete by:  As directed    Call MD for:  hives    Complete by:  As directed    Call MD for:  persistant dizziness or light-headedness    Complete by:  As directed    Call MD for:  persistant nausea and vomiting    Complete by:  As directed    Call MD for:  redness, tenderness, or signs of infection (pain, swelling, redness, odor or green/yellow discharge around incision site)    Complete by:  As directed    Call MD for:  severe uncontrolled pain    Complete by:  As directed    Call MD for:  temperature >100.4    Complete by:  As directed    Diet - low sodium heart healthy    Complete by:  As directed    Discharge instructions    Complete by:  As directed    You were seen in  the hospital for nausea and  vomiting. Your endoscopy of your stomach and esophagus were normal. On your follow up visits with our clinic and with your stomach doctor, we will discuss pursuing a study for gastroparesis to see how well your stomach is emptying.   For your diabetes, please monitor your sugars closely until you are back to your regular diet.   For your blood pressure I refilled your carvedilol which appears you were out of; continue your amlodipine 5mg  daily, clonidine 0.2mg  twice daily. You can start taking your lasix tomorrow or the day after based on your swelling, and the same for your metolazone.   If you have any questions or concerns, please call the clinic from 8:30AM - 4:30PM M-F at [phone]; after 5PM and weekends you can call [phone] and ask to speak to the "on call resident" and one of our doctors will call you back.   Increase activity slowly    Complete by:  As directed       Signed: Alphonzo Grieve, MD 11/04/2016, 3:13 PM

## 2016-11-02 NOTE — Op Note (Signed)
[facility] Patient Name: [name] Procedure Date : 11/02/2016 MRN: [id number] Attending MD: [name] , MD Date of Birth: 11/23/1975 CSN: [id number] Age: 41 Admit Type: Inpatient Procedure:                Upper GI endoscopy Indications:              Heartburn Providers:                [name], MD, Cleda Daub, RN, Tinnie Gens, Technician, Ivar Drape CRNA, CRNA Referring MD:              Medicines:                Propofol per Anesthesia, Monitored Anesthesia Care Complications:            No immediate complications. Estimated Blood Loss:     Estimated blood loss: none. Procedure:                Pre-Anesthesia Assessment:                           - Prior to the procedure, a History and Physical                            was performed, and patient medications and                            allergies were reviewed. The patient's tolerance of                            previous anesthesia was also reviewed. The risks                            and benefits of the procedure and the sedation                            options and risks were discussed with the patient.                            All questions were answered, and informed consent                            was obtained. Prior Anticoagulants: The patient                            last took naproxen 1 day prior to the procedure.                            ASA Grade Assessment: III - A patient with severe                            systemic disease. After reviewing the risks and  benefits, the patient was deemed in satisfactory                            condition to undergo the procedure.                           After obtaining informed consent, the endoscope was                            passed under direct vision. Throughout the                            procedure, the patient's blood pressure, pulse, and     oxygen saturations were monitored continuously. The                            Endoscope was introduced through the mouth, and                            advanced to the second part of duodenum. The upper                            GI endoscopy was accomplished without difficulty.                            The patient tolerated the procedure well. Scope In: Scope Out: Findings:      The esophagus was normal.      The stomach was normal.      The examined duodenum was normal. Impression:               - Normal esophagus.                           - Normal stomach.                           - Normal examined duodenum.                           - No specimens collected. Moderate Sedation:      Please see anesthesia notes, moderate sedation not given Recommendation:           - Return patient to hospital ward for ongoing care.                           - Full liquid diet. Advance as tolerated to Carb                            modified                           - I think he has had a gastroenteritis and slow                            recovery.Seems better and no pathology here.  You can do a gastric emptying study but I am not                            sure it will change management.                           Will be available if needed but signing off.                           - Continue present medications.                           - Resume Lovenox (enoxaparin) at prior dose today. Procedure Code(s):        --- Professional ---                           [phone], Esophagogastroduodenoscopy, flexible,                            transoral; diagnostic, including collection of                            specimen(s) by brushing or washing, when performed                            (separate procedure) Diagnosis Code(s):        --- Professional ---                           R12, Heartburn CPT copyright 2016 [employer] Association. All rights reserved. The  codes documented in this report are preliminary and upon coder review may  be revised to meet current compliance requirements. [name], MD 11/02/2016 11:37:55 AM This report has been signed electronically. Number of Addenda: 0

## 2016-11-02 NOTE — Anesthesia Preprocedure Evaluation (Signed)
Anesthesia Evaluation  Patient identified by MRN, date of birth, ID band Patient awake    Reviewed: Allergy & Precautions, NPO status , Patient's Chart, lab work & pertinent test results  Airway Mallampati: II  TM Distance: >3 FB Neck ROM: Full    Dental no notable dental hx.    Pulmonary neg pulmonary ROS,    Pulmonary exam normal breath sounds clear to auscultation       Cardiovascular hypertension, Pt. on medications Normal cardiovascular exam Rhythm:Regular Rate:Normal     Neuro/Psych negative neurological ROS  negative psych ROS   GI/Hepatic negative GI ROS, Neg liver ROS,   Endo/Other  diabetes, Type 1, Insulin DependentMorbid obesity  Renal/GU negative Renal ROS  negative genitourinary   Musculoskeletal negative musculoskeletal ROS (+)   Abdominal   Peds negative pediatric ROS (+)  Hematology negative hematology ROS (+)   Anesthesia Other Findings   Reproductive/Obstetrics negative OB ROS                             Anesthesia Physical Anesthesia Plan  ASA: III  Anesthesia Plan: MAC   Post-op Pain Management:    Induction: Intravenous  PONV Risk Score and Plan: 2 and Ondansetron and Dexamethasone  Airway Management Planned: Nasal Cannula  Additional Equipment:   Intra-op Plan:   Post-operative Plan:   Informed Consent: I have reviewed the patients History and Physical, chart, labs and discussed the procedure including the risks, benefits and alternatives for the proposed anesthesia with the patient or authorized representative who has indicated his/her understanding and acceptance.   Dental advisory given  Plan Discussed with: CRNA  Anesthesia Plan Comments:         Anesthesia Quick Evaluation

## 2016-11-02 NOTE — Transfer of Care (Signed)
Immediate Anesthesia Transfer of Care Note  Patient: [name]  Procedure(s) Performed: ESOPHAGOGASTRODUODENOSCOPY (EGD) (N/A )  Patient Location: Endoscopy Unit  Anesthesia Type:MAC  Level of Consciousness: awake, alert  and oriented  Airway & Oxygen Therapy: Patient Spontanous Breathing and Patient connected to nasal cannula oxygen  Post-op Assessment: Report given to RN and Post -op Vital signs reviewed and stable  Post vital signs: Reviewed and stable  Last Vitals:  Vitals:   11/02/16 0858 11/02/16 1010  BP: (!) 177/89 (!) 160/78  Pulse: 79 80  Resp:  15  Temp:  36.9 C  SpO2:  97%    Last Pain:  Vitals:   11/02/16 1010  TempSrc: Oral  PainSc:       Patients Stated Pain Goal: 2 (44/01/02 7253)  Complications: No apparent anesthesia complications

## 2016-11-02 NOTE — Progress Notes (Signed)
   Subjective:  Patient seen and examined. No episodes of vomiting over night, was tolerating clear liquids and water well. Complains of a "touch" of nausea. Reports no abdominal pain or discomfort. Also complaining of dehydration because he was NPO at midnight for EGD.   Objective:  Vital signs in last 24 hours: Vitals:   11/02/16 0542 11/02/16 0857 11/02/16 0858 11/02/16 1010  BP: (!) 152/85 (!) 175/88 (!) 177/89 (!) 160/78  Pulse: 73 80 79 80  Resp: 17 18  15   Temp: 98.4 F (36.9 C) 98.2 F (36.8 C)  98.5 F (36.9 C)  TempSrc: Oral Oral  Oral  SpO2: 100% 100%  97%  Weight:    (!) 353 lb (160.1 kg)  Height:    6\' 2"  (1.88 m)   General: Laying in bed comfortably, NAD HEENT: [place]/AT, EOMI, no scleral icterus Cardiac: RRR, No R/M/G appreciated Pulm: normal effort, CTAB Abd: soft, mild TTP to epigastric region, non distended, BS normal Ext: right AKA, +1 peripheral edema of left lower extremity Neuro: alert and oriented X3, cranial nerves II-XII grossly intact   Assessment/Plan:  Principal Problem:   Intractable nausea and vomiting Active Problems:   DM (diabetes mellitus) type I uncontrolled, periph vascular disorder (HCC)   Morbid obesity (HCC)   Essential hypertension   CKD (chronic kidney disease) stage 3, GFR 30-59 ml/min (HCC)   Above knee amputation of right lower extremity (HCC)  Nausea and Vomiting Unclear etiology at this time but most likely gastroparesis, EGD today to rule gastric outlet obstruction. Patient tolerating PO medicines and fluids. Patient does not appear dry on exam, creatinine near baseline. Will continue to hold off fluids at this time. BMET and CBC stable. Patient afebrile, Cr 2.19, WBC 9.0.  -GI following, appreciate recs-- EGD today -Consider gastric emptying study in future -Compazine or phenegran PRN for nausea  HTN BP 160/78, improved from admission.  -Amlodipine 5 mg -Clonidine 0.2 mg -Carvedilol 25 mg BID   Type 2 Diabetes  Home  regimen includesTresiba 140 units daily, Novolog 25 units TID, and Linagliptin 5 mg daily. BGs in goal range after starting Lantus 70. One episode of hypoglycemia this AM, CBG 67. Given ampule of D50. Otherwise BGs have been in goal range.  -Lantus 70 units -SSI moderate coverage -CBG Q4 hours  -NPO after midnight  Dispo: Anticipated discharge in approximately 1-2 day(s).   Melanee Spry, MD 11/02/2016, 10:27 AM Pager: [phone]

## 2016-11-02 NOTE — Anesthesia Preprocedure Evaluation (Deleted)
Anesthesia Evaluation  Patient identified by MRN, date of birth, ID band Patient awake    Airway Mallampati: II       Dental  (+) Teeth Intact, Dental Advisory Given   Pulmonary           Cardiovascular hypertension,      Neuro/Psych    GI/Hepatic   Endo/Other  diabetes  Renal/GU      Musculoskeletal   Abdominal   Peds  Hematology   Anesthesia Other Findings   Reproductive/Obstetrics                             Anesthesia Physical Anesthesia Plan Anesthesia Quick Evaluation

## 2016-11-02 NOTE — Progress Notes (Signed)
Internal Medicine Attending:   I saw and examined the patient. I reviewed the resident's note and I agree with the resident's findings and plan as documented in the resident's note. Nausea improved, able to tolerate some PO intake last night.  Went for EGD this morning- normal esophagus, stomach & duodenum.  GI feels likely this was a prolonged viral gastroenterititis and less likely diabetic gastroparesis.  Given he is feeling better we can likely discharge home today.  If recurrent symptoms would reconsider gastroparesis and obtain GES but otherwise it is reasonable to defer for now.

## 2016-11-02 NOTE — Discharge Summary (Signed)
Pt given discharge instructions and follow up info. Family to take pt home. Denies questions.

## 2016-11-02 NOTE — H&P (View-Only) (Signed)
Consultation  Referring Provider: Dr. Heber [place]     Primary Care Physician:  [name], DO Primary Gastroenterologist: Dr. Havery Moros        Reason for Consultation: Intractable nausea and vomiting x2 weeks       Impression / Plan:   Impression: 1. Intractable nausea/vomiting: for 2 weeks, controlled on meds here, unable to tolerate Metoclopramide in the past per ER notes; Consider outlet obstruction vs gastroparesis vs other 2. DM type 1: multiple complications from this being uncontrolled in the past-consider gastroparesis as well though recent hgb A1c on 10/31/16 was 6.3 3. Esophageal thickening: seen on recent CT, pt previously scheduled for EGD with Dr. Havery Moros on Nov 2nd  Plan: 1. Will proceed with EGD tomorrow at 12:45, will hold Lovenox tomorrow morning in anticipation of this. Last dose will be 10 pm tonight. 2. Will cancel gastric emptying study for now and possibly order in the future, if EGD negative tomorrow, as this would not change plans at the moment 3. Continue other supportive measures including anti-emetics today 4. Have cancelled upcoming outpatient EGD with Dr. Havery Moros 5. Please await any further recommendations from Dr. Carlean Purl later today  Thank you for your kind consultation, we will continue to follow.  [name]  11/01/2016, 1:45 PM Pager #: [phone]           [facility] Attending   I have taken an interval history, reviewed the chart and examined the patient. I agree with the Advanced Practitioner's note, impression and recommendations.   He gives a story of 2 weeks of symptoms that started suddenly.  He certainly is a set up for gastroparesis but does not seem to have chronic recurrent symptoms that I can tell from chart review.  He is not the best historian but I wonder if he did not have some sort of a gastroenteritis and a prolonged reaction.  I understand that urged to do a gastric emptying study but in the  setting of this acute illness plus hospitalization those often do not proved to be too useful.  I think ruling out some sort of gastric outlet obstruction makes sense.  EGD will do that.  The esophageal thickening is likely reflux change etc.   He had a dysphoric reaction to metoclopramide in the emergency department earlier this month and cannot take that.  Domperidone might be an option but that is a drug available in [place] and at certain pharmacies in this country but is not easily obtainable.  Fully he will settle down with supportive care and will need anything further done.  One thing to consider would be to workup his gallbladder for a calculus cholecystitis, he had that done in 2016 as well.  I doubt this is a gallbladder problem but with the neuropathy that he has sometimes it can be difficult to sort that out.  Note that he was febrile at one point throughout this again suggesting infectious etiology.  I appreciate the opportunity to care for this patient. [name], MD, [facility] [phone] (pager) 11/01/2016 7:16 PM      [name] is a 41 y.o. male with past medical history as listed below including DM type 1, s/p right AKA 2016, CKD stage 3, DVT, Pulmonary embolism on Eliquis and obesity, who presented to the ER on 10/31/16 with continued complaint of nausea and vomiting.    Per review of chart, patient has been seen multiple times in the ER for continued complaints  of nausea and vomiting over the past few days/weeks. At time of admission, patient reported being unable to tolerate oral intake or medications.  He has been ordered a Gastric emptying study in the setting of multiple other complications from uncontrolled DM. There has been discussion about scheduling him for sooner EGD while he is hospitalized.    Today, the patient is found comfortably sleeping in his room with his mother by his bedside. He awakens easily and tells me that he was having no  symptoms of nausea/vomiting when he saw Amy in the office, in fact this started "out of the blue" about 2 weeks ago. Patient describes constant nausea and only vomiting, "when I try to eat something".  Patient has been unable to tolerate food, medications or liquids over the past 2 weeks. He denies hematemesis.   Patient continues to describe abdominal pain, consistent with that at the time of OV below, though patient tells me this now hurts at more times throughout the day now, typically when he vomits and sometimes in between, both in his epigastrum and b/l lower quadrants.   Patient denies fever, chills, change in bowel habits, heartburn or reflux.  GI History: 09/14/16-OV, Amy Esterwood, PA-C: seen for abdominal pain and abnormal imaging, abdominal pain x couple of years-noticed pain with doing crunches, pressure-like, trying to lose weight, abnormal Ct and U/s reviewed (below); Assessment: abdominal pain thought MSK, pt scheduled for EGD Nov 2 with Dr. Havery Moros for esophageal thickening, mngmt for fatty liver discussed June 2018- Abdominal U/S: enlarged liver, no changes suggesting parenchymal disorder, CBD 6 mm echogenic material within the gallbladder gallstones not excluded prominent spleen Feb 2018- CT Abd/Pelvis w/o IV contrast: hepatic steatosis, layering gallstones or sludge, no cholecystic inflammation, spleen read as normal, increased stool burden and mild esophageal wall thickening July 2017- Colonoscopy, Dr. Lyda Jester: for change in bowel habits; Impression: negative except for 5 mm polyp-tubular adenoma  Past Medical History:  Diagnosis Date  . Acute venous embolism and thrombosis of deep vessels of proximal lower extremity ([place]) 07/19/2011  . Anemia   . Chest pain, neg MI, normal coronaries by cath 02/18/2013  . CKD (chronic kidney disease) stage 3, GFR 30-59 ml/min (HCC) 02/19/2013  . Colon polyps   . Diabetic ulcer of right foot ([place])   . DVT (deep venous thrombosis) ([place]) 09/2002    patient reports additional DVTs in '06 & '11 (unconfirmed)  . GERD (gastroesophageal reflux disease)   . History of blood transfusion    "related to OR" (10/31/2016)  . Hyperlipidemia 02/19/2013  . Hypertension   . Nausea & vomiting    "constant for the last couple weeks" (10/31/2016)  . Nephrotic syndrome   . Obesity    BMI 44, weight 346 pounds 01/30/14  . Pulmonary embolism ([place]) 09/2002   treated with 6 months of warfarin  . Type I diabetes mellitus ([place]) dx'd 2001    Past Surgical History:  Procedure Laterality Date  . AMPUTATION Right 12/22/2013   Procedure: AMPUTATION BELOW KNEE;  Surgeon: [name], MD;  Location: [facility];  Service: Orthopedics;  Laterality: Right;  . AMPUTATION Right 02/19/2014   Procedure: RIGHT ABOVE KNEE AMPUTATION ;  Surgeon: [name], MD;  Location: [place];  Service: Orthopedics;  Laterality: Right;  . CARDIAC CATHETERIZATION  02/18/2013   normal coronaries  . I&D EXTREMITY Right 12/14/2013   Procedure: IRRIGATION AND DEBRIDEMENT RIGHT FOOT;  Surgeon: [name], MD;  Location: [facility];  Service: Orthopedics;  Laterality: Right;  .  INCISION AND DRAINAGE ABSCESS  2007; 2015   "back"  . INCISION AND DRAINAGE OF WOUND Right 12/22/2013   Procedure: I&D RIGHT BUTTOCK;  Surgeon: [name], MD;  Location: [place];  Service: Orthopedics;  Laterality: Right;  . LEFT HEART CATHETERIZATION WITH CORONARY ANGIOGRAM N/A 02/18/2013   Procedure: LEFT HEART CATHETERIZATION WITH CORONARY ANGIOGRAM;  Surgeon: [name], MD;  [facility] CATH LAB;  Service: Cardiovascular;  Laterality: N/A;    Family History  Problem Relation Age of Onset  . Heart disease Mother   . Diabetes Mother   . Diabetes Maternal Uncle   . Diabetes Cousin      Social History  Substance Use Topics  . Smoking status: Never Smoker  . Smokeless tobacco: Never Used  . Alcohol use Yes     Comment: 10/31/2016 "a few beers q 6 months or so"    Prior to Admission medications     Medication Sig Start Date End Date Taking? Authorizing Provider  allopurinol (ZYLOPRIM) 100 MG tablet Take 2 tablets (200 mg total) by mouth daily. 08/03/16  Yes Kalman Shan Ratliff, DO  Insulin Pen Needle (PEN NEEDLES 3/16") 31G X 5 MM MISC Use five times daily 08/14/16  Yes Hoffman, Elza Rafter, DO  Lancet Devices (ACCU-CHEK [facility]) lancets Use to check blood sugars 5 times a day. Dx code:E11.9. Insulin dependent. 08/14/16  Yes Hoffman, Jessica Ratliff, DO  oxyCODONE-acetaminophen (PERCOCET/ROXICET) 5-325 MG tablet Take 1-2 tablets by mouth every 6 (six) hours as needed for severe pain. 10/25/16  Yes Montine Circle, PA-C  Alpha-Lipoic Acid 200 MG CAPS Take 1 capsule (200 mg total) by mouth daily. 08/17/16   Kalman Shan Ratliff, DO  amLODipine (NORVASC) 5 MG tablet Take 1 tablet (5 mg total) by mouth daily. 08/17/16 08/17/17  Kalman Shan Ratliff, DO  apixaban (ELIQUIS) 5 MG TABS tablet Take 1 tablet (5 mg total) by mouth 2 (two) times daily. 08/11/16   Axel Filler, MD  Blood Glucose Monitoring Suppl (ACCU-CHEK AVIVA) device Use to check blood sugar 5 times daily. Diag code E10.51. Multiple insulin dependent 08/11/16 08/11/17  Axel Filler, MD  calcitRIOL (ROCALTROL) 0.25 MCG capsule Take 1 capsule (0.25 mcg total) by mouth daily. 08/11/16   Axel Filler, MD  cloNIDine (CATAPRES) 0.2 MG tablet Take 1 tablet (0.2 mg total) by mouth 2 (two) times daily. 08/17/16 08/17/17  Kalman Shan Ratliff, DO  furosemide (LASIX) 40 MG tablet Take 2 tablets (80 mg total) by mouth 2 (two) times daily. 08/11/16   Axel Filler, MD  gabapentin (NEURONTIN) 300 MG capsule Take 1 capsule (300 mg total) by mouth 3 (three) times daily. 08/11/16   Axel Filler, MD  glucose blood (ACCU-CHEK AVIVA) test strip Use to check blood sugars 5 times a day. Dx code:E11.9. Insulin dependent. 08/14/16   [name], DO  HYDROcodone-acetaminophen (NORCO) 5-325 MG tablet Take 1  tablet by mouth every 4 (four) hours as needed for moderate pain. 59/56/38   Delora Fuel, MD  insulin aspart (NOVOLOG) 100 UNIT/ML FlexPen Inject 25 Units into the skin 3 (three) times daily. 08/11/16 08/11/17  Axel Filler, MD  Insulin Degludec 200 UNIT/ML SOPN Inject 140 Units into the skin daily. 08/11/16   Axel Filler, MD  linagliptin (TRADJENTA) 5 MG TABS tablet Take 1 tablet (5 mg total) by mouth daily. 08/11/16   Axel Filler, MD  loratadine (CLARITIN) 10 MG tablet Take 10 mg by mouth daily as needed for allergies.  [provider]  lovastatin (MEVACOR) 20 MG tablet Take 1 tablet (20 mg total) by mouth daily. 06/08/16 06/08/17  Velna Ochs, MD  metolazone (ZAROXOLYN) 2.5 MG tablet Take 2.5 mg by mouth every other day.    [provider]  ondansetron (ZOFRAN) 4 MG tablet Take 4 mg by mouth every 6 (six) hours as needed for nausea or vomiting.     [provider]  polyethylene glycol (MIRALAX / GLYCOLAX) packet Take 17 g by mouth daily as needed for mild constipation.     [provider]  prochlorperazine (COMPAZINE) 10 MG tablet Take 1 tablet (10 mg total) by mouth every 6 (six) hours as needed for nausea or vomiting. 85/46/27   Delora Fuel, MD  prochlorperazine (COMPAZINE) 25 MG suppository Place 1 suppository (25 mg total) rectally every 12 (twelve) hours as needed for nausea or vomiting. 03/50/09   Delora Fuel, MD  Zinc Gluconate 100 MG TABS Take 2.2 tablets (220 mg total) by mouth daily. 08/17/16   [name], DO    Current Facility-Administered Medications  Medication Dose Route Frequency Provider Last Rate Last Dose  . acetaminophen (TYLENOL) tablet 650 mg  650 mg Oral Q6H PRN Alphonzo Grieve, MD       Or  . acetaminophen (TYLENOL) suppository 650 mg  650 mg Rectal Q6H PRN Alphonzo Grieve, MD      . amLODipine (NORVASC) tablet 5 mg  5 mg Oral Daily Alphonzo Grieve, MD   5 mg at 11/01/16 0904  . carvedilol  (COREG) tablet 25 mg  25 mg Oral BID WC Lacroce, Hulen Shouts, MD      . cloNIDine (CATAPRES) tablet 0.2 mg  0.2 mg Oral BID Alphonzo Grieve, MD   0.2 mg at 11/01/16 0904  . enoxaparin (LOVENOX) injection 160 mg  1 mg/kg Subcutaneous Q12H Wynell Balloon, RPH   160 mg at 11/01/16 1233  . feeding supplement (BOOST / RESOURCE BREEZE) liquid 1 Container  1 Container Oral TID BM Hoffman, Erik C, DO      . insulin aspart (novoLOG) injection 0-15 Units  0-15 Units Subcutaneous Q4H Lucious Groves, DO   3 Units at 11/01/16 1233  . insulin glargine (LANTUS) injection 70 Units  70 Units Subcutaneous QHS Alphonzo Grieve, MD   70 Units at 10/31/16 2140    Allergies as of 10/31/2016  . (No Known Allergies)     Review of Systems:    Constitutional: No fever or chills Skin: No rash  Cardiovascular: No chest pain Respiratory: No SOB Gastrointestinal: See HPI and otherwise negative Genitourinary: No dysuria Neurological: No headache Musculoskeletal: No new muscle or joint pain Hematologic: No bleeding Psychiatric: No history of depression or anxiety   Physical Exam:  Vital signs in last 24 hours: Temp:  [98.6 F (37 C)-99.1 F (37.3 C)] 98.6 F (37 C) (10/24 0920) Pulse Rate:  [88-106] 88 (10/24 0920) Resp:  [17-20] 18 (10/24 0920) BP: (147-223)/(66-100) 162/78 (10/24 0920) SpO2:  [98 %-100 %] 98 % (10/24 0920) Weight:  [352 lb 11.8 oz (160 kg)-354 lb 8 oz (160.8 kg)] 352 lb 11.8 oz (160 kg) (10/23 2237) Last BM Date: 11/01/16 General:   Pleasant morbidly obese AA male appears to be in NAD, Well developed, Well nourished, alert and cooperative Head:  Normocephalic and atraumatic. Eyes:   PEERL, EOMI. No icterus. Conjunctiva pink. Ears:  Normal auditory acuity. Neck:  Supple Throat: Oral cavity and pharynx without inflammation, swelling or lesion.  Lungs: Respirations even and  unlabored. Lungs clear to auscultation bilaterally.   No wheezes, crackles, or rhonchi.  Heart: Normal S1, S2. No  MRG. Regular rate and rhythm. No peripheral edema, cyanosis or pallor.  Abdomen:  Soft, nondistended, mild epigastric and b/l lower quadrant ttp. No rebound or guarding. Normal bowel sounds. No appreciable masses or hepatomegaly. Rectal:  Not performed.  Msk:  Right AKA Extremities:  Without edema, no deformity or joint abnormality.  Neurologic:  Alert and  oriented x4;  grossly normal neurologically.   Skin:   Dry and intact without significant lesions or rashes. Psychiatric: Demonstrates good judgement and reason without abnormal affect or behaviors.   LAB RESULTS:  Recent Labs  10/31/16 1158 11/01/16 0355  WBC 11.8* 11.3*  HGB 12.0* 10.9*  HCT 37.4* 34.3*  PLT 336 318   BMET  Recent Labs  10/31/16 1158 11/01/16 0355  NA 139 135  K 4.1 3.7  CL 106 103  CO2 24 22  GLUCOSE 186* 132*  BUN 32* 33*  CREATININE 2.17* 2.15*  CALCIUM 8.8* 8.4*   LFT  Recent Labs  10/31/16 1158  PROT 6.9  ALBUMIN 2.8*  AST 21  ALT 25  ALKPHOS 149*  BILITOT 0.6   PREVIOUS ENDOSCOPIES:            See HPI

## 2016-11-02 NOTE — Anesthesia Procedure Notes (Signed)
Procedure Name: MAC Date/Time: 11/02/2016 11:15 AM Performed by: Suzy Bouchard Pre-anesthesia Checklist: Patient identified, Emergency Drugs available, Suction available, Patient being monitored and Timeout performed Patient Re-evaluated:Patient Re-evaluated prior to induction Oxygen Delivery Method: Nasal cannula

## 2016-11-02 NOTE — Interval H&P Note (Signed)
History and Physical Interval Note:  11/02/2016 10:46 AM  [name]  has presented today for surgery, with the diagnosis of Intractable Nausea and Vomiting, Esophageal wall thickening via CT  The various methods of treatment have been discussed with the patient and family. After consideration of risks, benefits and other options for treatment, the patient has consented to  Procedure(s): ESOPHAGOGASTRODUODENOSCOPY (EGD) (N/A) as a surgical intervention .  The patient's history has been reviewed, patient examined, no change in status, stable for surgery.  I have reviewed the patient's chart and labs.  Questions were answered to the patient's satisfaction.     Silvano Rusk

## 2016-11-02 NOTE — Telephone Encounter (Signed)
Thanks for letting me know!

## 2016-11-02 NOTE — Anesthesia Postprocedure Evaluation (Signed)
Anesthesia Post Note  Patient: [name]  Procedure(s) Performed: ESOPHAGOGASTRODUODENOSCOPY (EGD) (N/A )     Patient location during evaluation: PACU Anesthesia Type: MAC Level of consciousness: awake and alert Pain management: pain level controlled Vital Signs Assessment: post-procedure vital signs reviewed and stable Respiratory status: spontaneous breathing, nonlabored ventilation, respiratory function stable and patient connected to nasal cannula oxygen Cardiovascular status: stable and blood pressure returned to baseline Postop Assessment: no apparent nausea or vomiting Anesthetic complications: no    Last Vitals:  Vitals:   11/02/16 1137 11/02/16 1140  BP:  (!) 165/90  Pulse: 75 72  Resp: 15 (!) 22  Temp:    SpO2: 100% 100%    Last Pain:  Vitals:   11/02/16 1251  TempSrc:   PainSc: 0-No pain                 Montez Hageman

## 2016-11-02 NOTE — Progress Notes (Addendum)
Patient re-evaluated at bedside post EGD. He was eating dinner and states he feels much improved and is ready to go home. We discussed his EGD results and possible workup for gastroparesis in the future. Will message [facility] to call patient and set up hospital follow up appointment for next week; advised patient to call if he doesn't hear by tomorrow afternoon.  We discussed close monitoring of his CBGs at home until his diet is back to normal completely to avoid hypoglycemia. We discussed holding lasix and metolazone for 1 day as he was close to euvolemic. Given number to call clinic and after-hours if questions or concerns arise.   No further questions or concerns at this time.  Alphonzo Grieve, MD IMTS - PGY2 Pager [phone]

## 2016-11-04 ENCOUNTER — Encounter (HOSPITAL_COMMUNITY): Payer: Self-pay | Admitting: Internal Medicine

## 2016-11-08 ENCOUNTER — Encounter: Payer: Self-pay | Admitting: *Deleted

## 2016-11-10 ENCOUNTER — Ambulatory Visit (HOSPITAL_COMMUNITY): Admit: 2016-11-10 | Payer: Medicare Other | Admitting: Gastroenterology

## 2016-11-10 ENCOUNTER — Encounter (HOSPITAL_COMMUNITY): Payer: Self-pay

## 2016-11-10 SURGERY — ESOPHAGOGASTRODUODENOSCOPY (EGD) WITH PROPOFOL
Anesthesia: Monitor Anesthesia Care

## 2016-11-15 NOTE — Progress Notes (Signed)
   CC: follow up on essential hypertension  HPI:  [name] is a 41 y.o. male  with history noted below that presents to the internal medicine clinic for follow-up on essential hypertension. Please see problem based charting for the status of patient's chronic medical conditions.   Past Medical History:  Diagnosis Date  . Acute venous embolism and thrombosis of deep vessels of proximal lower extremity ([place]) 07/19/2011  . Anemia   . Chest pain, neg MI, normal coronaries by cath 02/18/2013  . CKD (chronic kidney disease) stage 3, GFR 30-59 ml/min ([facility]) 02/19/2013  . Colon polyps   . Diabetic ulcer of right foot ([place])   . DVT (deep venous thrombosis) ([place]) 09/2002   patient reports additional DVTs in '06 & '11 (unconfirmed)  . GERD (gastroesophageal reflux disease)   . History of blood transfusion    "related to OR" (10/31/2016)  . Hyperlipidemia 02/19/2013  . Hypertension   . Nausea & vomiting    "constant for the last couple weeks" (10/31/2016)  . Nephrotic syndrome   . Obesity    BMI 44, weight 346 pounds 01/30/14  . Pulmonary embolism ([place]) 09/2002   treated with 6 months of warfarin  . Type I diabetes mellitus ([place]) dx'd 2001    Review of Systems:  Review of Systems  Respiratory: Negative for shortness of breath.   Cardiovascular: Negative for chest pain.  Gastrointestinal: Negative for abdominal pain, diarrhea, nausea and vomiting.  Musculoskeletal: Negative for myalgias.     Physical Exam:  Vitals:   11/16/16 1557  BP: (!) 144/74  Pulse: 92  Temp: 98.6 F (37 C)  SpO2: 100%  Weight: (!) 369 lb 1.6 oz (167.4 kg)   Physical Exam  Cardiovascular: Normal rate, regular rhythm and normal heart sounds. Exam reveals no gallop and no friction rub.  No murmur heard. Pulmonary/Chest: Effort normal and breath sounds normal. No respiratory distress. He has no wheezes. He has no rales.  Abdominal: Soft. He exhibits no distension. There is no tenderness.    Assessment  & Plan:   See encounters tab for problem based medical decision making.     Patient discussed with Dr. Beryle Beams

## 2016-11-16 ENCOUNTER — Other Ambulatory Visit: Payer: Self-pay

## 2016-11-16 ENCOUNTER — Encounter: Payer: Self-pay | Admitting: Internal Medicine

## 2016-11-16 ENCOUNTER — Ambulatory Visit (INDEPENDENT_AMBULATORY_CARE_PROVIDER_SITE_OTHER): Payer: Medicare Other | Admitting: Internal Medicine

## 2016-11-16 VITALS — BP 144/74 | HR 92 | Temp 98.6°F | Ht 74.0 in | Wt 369.1 lb

## 2016-11-16 DIAGNOSIS — Z87898 Personal history of other specified conditions: Secondary | ICD-10-CM

## 2016-11-16 DIAGNOSIS — Z79899 Other long term (current) drug therapy: Secondary | ICD-10-CM | POA: Diagnosis not present

## 2016-11-16 DIAGNOSIS — I10 Essential (primary) hypertension: Secondary | ICD-10-CM | POA: Diagnosis present

## 2016-11-16 LAB — GLUCOSE, CAPILLARY: GLUCOSE-CAPILLARY: 181 mg/dL — AB (ref 65–99)

## 2016-11-16 NOTE — Patient Instructions (Signed)
Mr. Laser,  It was a pleasure seeing you today. An order has been placed for your gastric emptying study. Please follow up with me in 3 months.

## 2016-11-19 NOTE — Assessment & Plan Note (Signed)
Assessment:  Essential hypertension Patient currently takes carvedilol 25mg  BID, lasix 80mg  BID, metolazone 2.5mg  every other day, clonidine 0.2mg  BID and amlodipine 5mg  daily.  Today's blood pressure is 144/74 stable and close to goal of <140/80  Plan -continue current blood pressure regimen

## 2016-11-19 NOTE — Assessment & Plan Note (Signed)
Assessment:  Nausea/vomiting Patient was recently hospitalized for intractable nausea and vomiting from 10/23-10/25.  He had a CT abdomen in the ED that did not reveal any acute pathology for his symptoms.  He had an EGD during his admission, which did not reveal any acute pathology and no biopsies were taken.  Discharge recommendations included a gastric emptying study.   Plan - gastric emptying study

## 2016-11-20 ENCOUNTER — Ambulatory Visit: Payer: Medicare Other | Admitting: Physical Therapy

## 2016-11-20 NOTE — Progress Notes (Signed)
Medicine attending: Medical history, presenting problems, physical findings, and medications, reviewed with resident physician Dr Jessica Hoffman on the day of the patient visit and I concur with her evaluation and management plan. 

## 2016-11-21 ENCOUNTER — Encounter: Payer: Self-pay | Admitting: Physical Therapy

## 2016-11-21 ENCOUNTER — Ambulatory Visit: Payer: Medicare Other | Attending: Internal Medicine | Admitting: Physical Therapy

## 2016-11-21 DIAGNOSIS — R2689 Other abnormalities of gait and mobility: Secondary | ICD-10-CM

## 2016-11-21 DIAGNOSIS — M6281 Muscle weakness (generalized): Secondary | ICD-10-CM | POA: Diagnosis not present

## 2016-11-21 DIAGNOSIS — Z9181 History of falling: Secondary | ICD-10-CM | POA: Diagnosis not present

## 2016-11-21 DIAGNOSIS — M25651 Stiffness of right hip, not elsewhere classified: Secondary | ICD-10-CM | POA: Diagnosis not present

## 2016-11-21 DIAGNOSIS — R293 Abnormal posture: Secondary | ICD-10-CM | POA: Insufficient documentation

## 2016-11-21 DIAGNOSIS — R2681 Unsteadiness on feet: Secondary | ICD-10-CM | POA: Diagnosis not present

## 2016-11-21 NOTE — Therapy (Addendum)
[facility] [address] [place], [place], [address] Phone: [phone]   Fax:  [phone]  Physical Therapy Evaluation  Patient Details  Name: [name] MRN: [id number] Date of Birth: 02/05/1975 Referring Provider: Kalman Shan, DO   Encounter Date: 11/21/2016  PT End of Session - 11/21/16 1048    Visit Number  1    Number of Visits  33    Date for PT Re-Evaluation  01/19/17    Authorization Type  Medicare G-Code & progress note every 10th visit & Medicaid    PT Start Time  0800    PT Stop Time  0850    PT Time Calculation (min)  50 min    Equipment Utilized During Treatment  Gait belt    Activity Tolerance  Patient tolerated treatment well    Behavior During Therapy  [facility] for tasks assessed/performed       Past Medical History:  Diagnosis Date  . Acute venous embolism and thrombosis of deep vessels of proximal lower extremity ([place]) 07/19/2011  . Anemia   . Chest pain, neg MI, normal coronaries by cath 02/18/2013  . CKD (chronic kidney disease) stage 3, GFR 30-59 ml/min ([facility]) 02/19/2013  . Colon polyps   . Diabetic ulcer of right foot ([place])   . DVT (deep venous thrombosis) ([place]) 09/2002   patient reports additional DVTs in '06 & '11 (unconfirmed)  . GERD (gastroesophageal reflux disease)   . History of blood transfusion    "related to OR" (10/31/2016)  . Hyperlipidemia 02/19/2013  . Hypertension   . Nausea & vomiting    "constant for the last couple weeks" (10/31/2016)  . Nephrotic syndrome   . Obesity    BMI 44, weight 346 pounds 01/30/14  . Pulmonary embolism ([place]) 09/2002   treated with 6 months of warfarin  . Type I diabetes mellitus ([place]) dx'd 2001    Past Surgical History:  Procedure Laterality Date  . CARDIAC CATHETERIZATION  02/18/2013   normal coronaries  . INCISION AND DRAINAGE ABSCESS  2007; 2015   "back"    There were no vitals filed for this visit.   Subjective Assessment -  11/21/16 0800    Subjective  This 41yo developed right foot cellulitis that progressed to abcess/osteomyelitis. He underwent a right Transtibial Amputation & developed a large clot. He underwent a right Transfemoral Amputation on 02/19/2014. He has been at [facility]. He recieved his first prosthesis Nov. 2016. He was discharged from [facility] on 07/10/2016 to live independently in apartment. Referred 10/22/16 by Kalman Shan DO    Pertinent History  Right TFA, Type 1 DM, HTN, chest pain, DVT/PE, nephrotic syndrome, morbid obesity, LBP    Patient Stated Goals  To walk with prosthesis in community and stand to perform ADLs at home.     Currently in Pain?  No/denies         [facility] PT Assessment - 11/21/16 0801      Assessment   Medical Diagnosis  Rt Transfemoral Amputation    Referring Provider  Kalman Shan, DO    Onset Date/Surgical Date  10/22/16 PT referral    Prior Therapy  SNF facility      Precautions   Precautions  Fall      Balance Screen   Has the patient fallen in the past 6 months  Yes    How many times?  1 no prosthesis, working out off block no injuries    Has the patient had  a decrease in activity level because of a fear of falling?   No    Is the patient reluctant to leave their home because of a fear of falling?   No      Home Environment   Living Environment  Private residence    Living Arrangements  Alone    Type of [place] - 2 wheels;Tub bench;Wheelchair - manual;Grab bars - toilet    Additional Comments  First floor, no steps, all paved surfaces      Prior Function   Level of Independence  Independent    Vocation  On disability;Full time employment worked full time prior to [employer], carrying, standing, reaching    Leisure  [employer], basketball, running      Cognition   Overall Cognitive Status  Within Functional Limits  for tasks assessed      Posture/Postural Control   Posture/Postural Control  Postural limitations    Postural Limitations  Rounded Shoulders;Forward head;Flexed trunk;Weight shift left      AROM   Overall AROM   Within functional limits for tasks performed;Deficits    Overall AROM Comments  BUEs & LLE grossly WFL    AROM Assessment Site  Hip    Right/Left Hip  Right    Right Hip Extension  -10 Thomas position      Strength   Overall Strength  Within functional limits for tasks performed;Deficits    Overall Strength Comments  tested in sitting BUEs 5/5, Rt hip grossly 5/5, LLE 5/5 except knee functionally buckles when fatigued.      Transfers   Transfers  Sit to Stand;Stand to Sit    Sit to Stand  5: Supervision;With upper extremity assist;From chair/3-in-1;Multiple attempts    Stand to Sit  5: Supervision;With upper extremity assist;To chair/3-in-1      Ambulation/Gait   Ambulation/Gait  Yes    Ambulation/Gait Assistance  4: Min assist    Ambulation/Gait Assistance Details  Excessive UE weight bearing on RW    Ambulation Distance (Feet)  38 Feet 23' & 38'    Assistive device  Rolling walker;Prosthesis    Gait Pattern  Step-through pattern;Decreased step length - left;Decreased stance time - right;Decreased stride length;Decreased hip/knee flexion - right;Decreased weight shift to right;Right circumduction;Right hip hike;Antalgic;Trunk flexed;Wide base of support;Poor foot clearance - right    Ambulation Surface  Indoor;Level    Gait velocity  0.69 ft/sec      Balance   Balance Assessed  Yes      Static Standing Balance   Static Standing - Balance Support  Bilateral upper extremity supported RW support    Static Standing - Level of Assistance  5: Stand by assistance    Static Standing - Comment/# of Minutes  2 minutes       Dynamic Standing Balance   Dynamic Standing - Balance Support  Bilateral upper extremity supported;Right upper extremity supported;Left upper extremity  supported reaching tested with each UE    Dynamic Standing - Level of Assistance  5: Stand by assistance    Dynamic Standing - Balance Activities  Head nods;Head turns;Eyes opn;Reaching for objects    Dynamic Standing - Comments  reaches with either UE with single UE support on RW 2";  turns head only, no trunk rotation or movement to look right, left, up &  down.  Only tolerates releasing RW with 1 hand to reach for <3 sec      Standardized Balance Assessment   Standardized Balance Assessment  --      Prosthetics Assessment - 11/21/16 0800      Prosthetics   Prosthetic Care Independent with  Skin check;Residual limb care;Prosthetic cleaning    Prosthetic Care Dependent with  Correct ply sock adjustment;Proper wear schedule/adjustment;Proper weight-bearing schedule/adjustment;Other (comment) donning properly    Prosthetic Care Comments   PT instructed in donning liner with proper orientation which effects rotation alignment of prosthesis.     Donning prosthesis   Supervision    Doffing prosthesis   Modified independent (Device/Increase time)    Current prosthetic wear tolerance (days/week)   daily    Current prosthetic wear tolerance (#hours/day)   6 hrs, wears liner all awake hours    Current prosthetic weight-bearing tolerance (hours/day)   Patient tolerated 3 min of gait with partial weight on prosthesis without limb pain.     Edema  Non-pitting edema on residual limb. Pitting edema on LLE entire LE with TED compression knee high sock.     Residual limb condition   No open areas. Incision healed with no adherances. Normal color, temperature and moisture level. Minimal hair growth. No adductor roll.             Objective measurements completed on examination: See above findings.              PT Education - 11/21/16 0840    Education provided  Yes    Education Details  adjusting w/c brakes, plan for PT,     Person(s) Educated  Patient    Methods  Explanation;Verbal cues     Comprehension  Verbalized understanding;Verbal cues required;Need further instruction       PT Short Term Goals - 11/21/16 1114      PT SHORT TERM GOAL #1   Title  Patient demonstrates proper donning of prosthesis. (All STGs Target Date: 12/21/2016)    Baseline  Patient requires skilled instructin for proper donning of prosthesis.     Time  1    Period  Months    Status  New    Target Date  12/21/16      PT SHORT TERM GOAL #2   Title  Patient tolerates prosthesis wear daily >8 hrs total /day with limb pain or skin issues.     Baseline  Patient tolerates prosthesis wear ~ 6hrs total /day.     Time  1    Period  Months    Status  New    Target Date  12/21/16      PT SHORT TERM GOAL #3   Title  Patient performs standing balance activities with RW & prosthesis reaching 5" with release for >5 seconds and scans with head & shoulder rotation with supervision.      Baseline  Standing balance with RW with excessive UE weight bearing: reaches 2" with <3sec release and turns head only to scan with close supervision.     Time  1    Period  Months    Status  New    Target Date  12/21/16      PT SHORT TERM GOAL #4   Title  Patient ambulates 60' with RW & prosthesis with minA.     Baseline  Patient ambulates 78' with RW & prosthesis with minA with gait deviations impacting safety.     Time  1  Period  Months    Status  New    Target Date  12/21/16        PT Long Term Goals - 11/21/16 1105      PT LONG TERM GOAL #1   Title  Patient verbalizes & demonstrates proper prosthetic care to enable safe use of prosthesis. (Target Date: 03/16/2017)    Baseline  Patient is dependent in proper donning, wear & weight bearing adjustment of prosthesis.     Time  4    Period  Months    Status  New    Target Date  03/16/17      PT LONG TERM GOAL #2   Title  Patient tolerates daily wear of prosthesis >75% of awake hours without skin issues or limb pain to enable function throughout his day.     Baseline  Patient tolerates wear of prosthesis ~6 hrs /day which is ~1/3 of awake hours.     Time  4    Period  Months    Status  New    Target Date  03/16/17      PT LONG TERM GOAL #3   Title  Patient performs standing balance activities with rolling walker support & prosthesis reaching 10" anteriorly & to floor, manage clothes modified independent.      Baseline  Patient requires Excessive upper extremity support on RW. He reaches 2" only with release of RW <3 seconds to retrieve items and turn head only to scan environment.     Time  4    Period  Months    Status  New    Target Date  03/16/17      PT LONG TERM GOAL #4   Title  Patient ambulates 100' with RW & prosthesis modified independent to enable basic community access.     Baseline  Patient ambulates 50' with RW & Prosthesis with minimal assist requiring w/c to follow due to rapid decline & safety with gait deviations impacting safety.     Time  4    Period  Months    Status  New    Target Date  03/16/17      PT LONG TERM GOAL #5   Title  Patient negotiates ramp, curb with RW & stairs with 2 rails with transfemoral prosthesis modified independent to enable community access.      Baseline  Patient is dependent on level surface gait & unable to negotiate ramps, curbs or stairs.     Time  4    Period  Months    Status  New    Target Date  03/16/17             Plan - 11/21/16 1102    Clinical Impression Statement  This 41yo underwent a right Transfemoral Amputation on 02/19/2014. He was in a SNF initially until independent to live from w/c level. On 07/10/2016 moved into an apartment to live independently. He received his first prosthesis Nov. 2016 and had some limited instruction at [facility] but was not functional with prosthesis at discharge. Patient has been working on strength at gym to enable ability to use his prosthesis functionally. He wears prosthesis daily up to 6 hours but this limits function other awake hours. He is  dependent in proper donning, adjustment of wear & weight bearing without skilled instruction. Standing balance is impaired with UE support required and he only reaches 2" <3 second release of RW to retrieve items & head motion only to scan environment.  His prosthetic gait is dependent on excessive UE weight bearing with LLE fatigue at 38' and deviations negatively impacting safety. Patient would benefit from skilled care to improve function & safety with his Transfemoral prosthesis.     History and Personal Factors relevant to plan of care:  Right TFA, DM1, HTN, HTN, chest pain, DVT/PE, nephrotic syndrome, morbid obesity, LBP    Clinical Presentation  Evolving    Clinical Presentation due to:  Bil. LE issues, morbid obesity, recent ED visits for gastric issues & chest pain.     Clinical Decision Making  Moderate    Rehab Potential  Good    Clinical Impairments Affecting Rehab Potential  visit limitation with Medicaid, Morbid obesity, length of time with immobility / w/c bound, lives alone.     PT Frequency  2x / week    PT Duration  Other (comment) 16 weeks    PT Treatment/Interventions  ADLs/Self Care Home Management;Gait training;DME Instruction;Stair training;Functional mobility training;Therapeutic activities;Therapeutic exercise;Balance training;Neuromuscular re-education;Patient/family education;Prosthetic Training;Manual techniques    PT Next Visit Plan  review prosthetic care, progressively increase wear, HEP at sink for midline/standing exercises, prosthetic gait with RW    Consulted and Agree with Plan of Care  Patient       Patient will benefit from skilled therapeutic intervention in order to improve the following deficits and impairments:  Abnormal gait, Decreased activity tolerance, Decreased balance, Decreased endurance, Decreased knowledge of use of DME, Decreased mobility, Decreased range of motion, Decreased strength, Postural dysfunction, Prosthetic Dependency, Obesity,  Pain  Visit Diagnosis: Other abnormalities of gait and mobility - Plan: PT plan of care cert/re-cert  Stiffness of right hip, not elsewhere classified - Plan: PT plan of care cert/re-cert  Muscle weakness (generalized) - Plan: PT plan of care cert/re-cert  Unsteadiness on feet - Plan: PT plan of care cert/re-cert  History of falling - Plan: PT plan of care cert/re-cert  Abnormal posture - Plan: PT plan of care cert/re-cert  G-Codes - 03/88/82 1758    Functional Assessment Tool Used (Outpatient Only)  Patient is dependent in proper donning, adjusting wear schedule & adjusting weight bearing. Patient tolerates wear daily ~6 hrs which is ~1/3 of awake hours.     Functional Limitation  Self care    Mobility: Walking and Moving Around Goal Status [phone])  At least 40 percent but less than 60 percent impaired, limited or restricted    Self Care Goal Status ([id number])  At least 1 percent but less than 20 percent impaired, limited or restricted        Problem List Patient Active Problem List   Diagnosis Date Noted  . History of nausea and vomiting 10/31/2016  . Prosthesis adjustment 08/17/2016  . History of pulmonary embolus (PE) 06/09/2016  . Normocytic anemia 01/30/2014  . Above knee amputation of right lower extremity ([place]) 12/26/2013  . GERD (gastroesophageal reflux disease) 08/14/2013  . Hyperlipidemia 02/19/2013  . S/P cardiac cath, 02/18/13, normal coronaries 02/19/2013  . CKD (chronic kidney disease) stage 3, GFR 30-59 ml/min (HCC) 02/19/2013  . Nephrotic syndrome 02/12/2012  . Essential hypertension 07/24/2011  . Long term current use of anticoagulant therapy 07/19/2011  . Back pain 07/12/2011  . History of DVT (deep vein thrombosis) 07/28/2009  . PROTEINURIA, MILD 02/01/2007  . Morbid obesity ([place]) 10/26/2005  . DM (diabetes mellitus) type I uncontrolled, periph vascular disorder ([place]) 01/09/2002    Dynesha Woolen PT, DPT 11/21/2016, 6:07 PM  [facility] [address]  [place], [place], [address] Phone: [phone]   Fax:  [phone]  Name: [name] MRN: [id number] Date of Birth: 10-23-1975

## 2016-11-21 NOTE — Patient Instructions (Signed)
Left leg need closed work like leg press of standing with knee almost straight.  While standing reach with one hand, hold onto bar with other hand. Increase time without double support.

## 2016-11-21 NOTE — Addendum Note (Signed)
Addended by: Isaias Cowman on: 11/21/2016 06:11 PM   Modules accepted: Orders

## 2016-11-29 ENCOUNTER — Ambulatory Visit: Payer: Medicare Other | Admitting: Physical Therapy

## 2016-12-04 ENCOUNTER — Ambulatory Visit: Payer: Medicare Other | Admitting: Physical Therapy

## 2016-12-05 ENCOUNTER — Ambulatory Visit (HOSPITAL_COMMUNITY)
Admission: RE | Admit: 2016-12-05 | Discharge: 2016-12-05 | Disposition: A | Discharge status: Home / Self Care | Payer: Medicare Other | Source: Ambulatory Visit | Attending: Student in an Organized Health Care Education/Training Program | Admitting: Student in an Organized Health Care Education/Training Program

## 2016-12-05 DIAGNOSIS — K30 Functional dyspepsia: Secondary | ICD-10-CM | POA: Insufficient documentation

## 2016-12-05 DIAGNOSIS — R112 Nausea with vomiting, unspecified: Secondary | ICD-10-CM | POA: Diagnosis not present

## 2016-12-05 DIAGNOSIS — R109 Unspecified abdominal pain: Secondary | ICD-10-CM | POA: Diagnosis not present

## 2016-12-05 DIAGNOSIS — Z87898 Personal history of other specified conditions: Secondary | ICD-10-CM

## 2016-12-05 MED ORDER — TECHNETIUM TC 99M SULFUR COLLOID
2.0000 | Freq: Once | INTRAVENOUS | Status: AC | PRN
  Administered 2016-12-05: 2 via ORAL

## 2016-12-07 ENCOUNTER — Ambulatory Visit: Payer: Medicare Other | Admitting: Physical Therapy

## 2016-12-07 ENCOUNTER — Encounter: Payer: Self-pay | Admitting: Physical Therapy

## 2016-12-07 DIAGNOSIS — R2681 Unsteadiness on feet: Secondary | ICD-10-CM

## 2016-12-07 DIAGNOSIS — Z9181 History of falling: Secondary | ICD-10-CM | POA: Diagnosis not present

## 2016-12-07 DIAGNOSIS — M6281 Muscle weakness (generalized): Secondary | ICD-10-CM

## 2016-12-07 DIAGNOSIS — R2689 Other abnormalities of gait and mobility: Secondary | ICD-10-CM

## 2016-12-07 DIAGNOSIS — R293 Abnormal posture: Secondary | ICD-10-CM

## 2016-12-07 DIAGNOSIS — M25651 Stiffness of right hip, not elsewhere classified: Secondary | ICD-10-CM

## 2016-12-07 NOTE — Therapy (Signed)
[facility] [address] [place], [place], [address] Phone: [phone]   Fax:  [phone]  Physical Therapy Treatment  Patient Details  Name: [name] MRN: [id number] Date of Birth: 12-18-1975 Referring Provider: Kalman Shan, DO   Encounter Date: 12/07/2016  PT End of Session - 12/07/16 1237    Visit Number  2    Number of Visits  33    Date for PT Re-Evaluation  01/19/17    Authorization Type  Medicare G-Code & progress note every 10th visit & Medicaid    PT Start Time  1100    PT Stop Time  1203    PT Time Calculation (min)  63 min    Equipment Utilized During Treatment  Gait belt    Activity Tolerance  Patient tolerated treatment well    Behavior During Therapy  WFL for tasks assessed/performed       Past Medical History:  Diagnosis Date  . Acute venous embolism and thrombosis of deep vessels of proximal lower extremity ([facility]) 07/19/2011  . Anemia   . Chest pain, neg MI, normal coronaries by cath 02/18/2013  . CKD (chronic kidney disease) stage 3, GFR 30-59 ml/min ([facility]) 02/19/2013  . Colon polyps   . Diabetic ulcer of right foot ([place])   . DVT (deep venous thrombosis) ([facility]) 09/2002   patient reports additional DVTs in '06 & '11 (unconfirmed)  . GERD (gastroesophageal reflux disease)   . History of blood transfusion    "related to OR" (10/31/2016)  . Hyperlipidemia 02/19/2013  . Hypertension   . Nausea & vomiting    "constant for the last couple weeks" (10/31/2016)  . Nephrotic syndrome   . Obesity    BMI 44, weight 346 pounds 01/30/14  . Pulmonary embolism ([facility]) 09/2002   treated with 6 months of warfarin  . Type I diabetes mellitus ([place]) dx'd 2001    Past Surgical History:  Procedure Laterality Date  . AMPUTATION Right 12/22/2013   Procedure: AMPUTATION BELOW KNEE;  Surgeon: [name], MD;  Location: [place] [facility];  Service: Orthopedics;  Laterality: Right;  . AMPUTATION Right 02/19/2014    Procedure: RIGHT ABOVE KNEE AMPUTATION ;  Surgeon: [name], MD;  Location: [facility];  Service: Orthopedics;  Laterality: Right;  . CARDIAC CATHETERIZATION  02/18/2013   normal coronaries  . ESOPHAGOGASTRODUODENOSCOPY N/A 11/02/2016   Procedure: ESOPHAGOGASTRODUODENOSCOPY (EGD);  Surgeon: [name], MD;  Location: [facility] ENDOSCOPY;  Service: Endoscopy;  Laterality: N/A;  . I&D EXTREMITY Right 12/14/2013   Procedure: IRRIGATION AND DEBRIDEMENT RIGHT FOOT;  Surgeon: [name], MD;  Location: [facility];  Service: Orthopedics;  Laterality: Right;  . INCISION AND DRAINAGE ABSCESS  2007; 2015   "back"  . INCISION AND DRAINAGE OF WOUND Right 12/22/2013   Procedure: I&D RIGHT BUTTOCK;  Surgeon: [name], MD;  Location: [facility];  Service: Orthopedics;  Laterality: Right;  . LEFT HEART CATHETERIZATION WITH CORONARY ANGIOGRAM N/A 02/18/2013   Procedure: LEFT HEART CATHETERIZATION WITH CORONARY ANGIOGRAM;  Surgeon: [name], MD;  Location: [facility] CATH LAB;  Service: Cardiovascular;  Laterality: N/A;    There were no vitals filed for this visit.  Subjective Assessment - 12/07/16 1108    Subjective  He has been wearing prosthesis ~6hrs when home & 90-124minutes at gym.     Pertinent History  Right TFA, Type 1 DM, HTN, chest pain, DVT/PE, nephrotic syndrome, morbid obesity, LBP    Patient Stated Goals  To walk with prosthesis  in community and stand to perform ADLs at home.     Currently in Pain?  No/denies                      [facility] Adult PT Treatment/Exercise - 12/07/16 1100      Transfers   Transfers  Sit to Stand;Stand to Sit    Sit to Stand  4: Min guard;5: Supervision;With upper extremity assist;From chair/3-in-1;Multiple attempts to RW for stabilization    Sit to Stand Details (indicate cue type and reason)  verbal & demo cues on technique including left foot placement.     Stand to Sit  5: Supervision;With upper extremity assist;To chair/3-in-1 from RW      Ambulation/Gait    Ambulation/Gait  Yes    Ambulation/Gait Assistance  4: Min assist    Ambulation/Gait Assistance Details  Excessive UE weight bearing. PT demo sequence to facilitate step through pattern & upright posture    Ambulation Distance (Feet)  24 Feet 24' & 10'     Assistive device  Rolling walker;Prosthesis    Gait Pattern  Step-through pattern;Decreased step length - left;Decreased stance time - right;Decreased stride length;Decreased hip/knee flexion - right;Decreased weight shift to right;Right circumduction;Right hip hike;Antalgic;Trunk flexed;Wide base of support;Poor foot clearance - right    Ambulation Surface  Indoor;Level      Neuro Re-ed    Neuro Re-ed Details   Standing balance with counter posterior at pelvis, RW anterior & prothesis under pelvis:  green theraband single UE row 10 reps ea.       Prosthetics   Prosthetic Care Comments   Patient reports getting new liner & prosthesis in next few weeks.     Current prosthetic wear tolerance (days/week)   daily    Current prosthetic wear tolerance (#hours/day)   6 hrs, wears liner all awake hours    Residual limb condition   No open areas. Incision healed with no adherances. Normal color, temperature and moisture level. Minimal hair growth. No adductor roll.     Education Provided  Skin check;Residual limb care    Person(s) Educated  Patient    Education Method  Explanation;Verbal cues    Education Method  Verbalized understanding;Needs further instruction             PT Education - 12/07/16 1200    Education provided  Yes    Education Details  plywood under w/c cushion & sitting positioning in w/c, standing balance resistive training at gym.     Person(s) Educated  Patient    Methods  Explanation;Demonstration;Tactile cues;Verbal cues;Handout    Comprehension  Verbalized understanding;Returned demonstration;Verbal cues required;Tactile cues required;Need further instruction       PT Short Term Goals - 12/07/16 1237      PT  SHORT TERM GOAL #1   Title  Patient demonstrates proper donning of prosthesis. (All STGs Target Date: 12/21/2016)    Baseline  Patient requires skilled instructin for proper donning of prosthesis.     Time  1    Period  Months    Status  New    Target Date  12/21/16      PT SHORT TERM GOAL #2   Title  Patient tolerates prosthesis wear daily >8 hrs total /day with limb pain or skin issues.     Baseline  Patient tolerates prosthesis wear ~ 6hrs total /day.     Time  1    Period  Months    Status  On-going  Target Date  12/21/16      PT SHORT TERM GOAL #3   Title  Patient performs standing balance activities with RW & prosthesis reaching 5" with release for >5 seconds and scans with head & shoulder rotation with supervision.      Baseline  Standing balance with RW with excessive UE weight bearing: reaches 2" with <3sec release and turns head only to scan with close supervision.     Time  1    Period  Months    Status  On-going    Target Date  12/21/16      PT SHORT TERM GOAL #4   Title  Patient ambulates 44' with RW & prosthesis with minA.     Baseline  Patient ambulates 65' with RW & prosthesis with minA with gait deviations impacting safety.     Time  1    Period  Months    Status  On-going    Target Date  12/21/16        PT Long Term Goals - 12/07/16 1237      PT LONG TERM GOAL #1   Title  Patient verbalizes & demonstrates proper prosthetic care to enable safe use of prosthesis. (Target Date: 03/16/2017)    Baseline  Patient is dependent in proper donning, wear & weight bearing adjustment of prosthesis.     Time  4    Period  Months    Status  On-going    Target Date  03/16/17      PT LONG TERM GOAL #2   Title  Patient tolerates daily wear of prosthesis >75% of awake hours without skin issues or limb pain to enable function throughout his day.    Baseline  Patient tolerates wear of prosthesis ~6 hrs /day which is ~1/3 of awake hours.     Time  4    Period  Months     Status  On-going    Target Date  03/16/17      PT LONG TERM GOAL #3   Title  Patient performs standing balance activities with rolling walker support & prosthesis reaching 10" anteriorly & to floor, manage clothes modified independent.      Baseline  Patient requires Excessive upper extremity support on RW. He reaches 2" only with release of RW <3 seconds to retrieve items and turn head only to scan environment.     Time  4    Period  Months    Status  On-going    Target Date  03/16/17      PT LONG TERM GOAL #4   Title  Patient ambulates 100' with RW & prosthesis modified independent to enable basic community access.     Baseline  Patient ambulates 53' with RW & Prosthesis with minimal assist requiring w/c to follow due to rapid decline & safety with gait deviations impacting safety.     Time  4    Period  Months    Status  On-going    Target Date  03/16/17      PT LONG TERM GOAL #5   Title  Patient negotiates ramp, curb with RW & stairs with 2 rails with transfemoral prosthesis modified independent to enable community access.      Baseline  Patient is dependent on level surface gait & unable to negotiate ramps, curbs or stairs.     Time  4    Period  Months    Status  On-going    Target Date  03/16/17            Plan - 12/07/16 1238    Clinical Impression Statement  Patient's gait improved with skilled instruction in sequence of RW, RLE, RW, LLE.... to facilitate upright posture & step through pattern.  Patient continues to work with Physiological scientist at gym & wants ideals of things to do. He appears to understand standing activities instructed today.     Rehab Potential  Good    Clinical Impairments Affecting Rehab Potential  visit limitation with Medicaid, Morbid obesity, length of time with immobility / w/c bound, lives alone.     PT Frequency  2x / week    PT Duration  Other (comment) 16 weeks    PT Treatment/Interventions  ADLs/Self Care Home Management;Gait training;DME  Instruction;Stair training;Functional mobility training;Therapeutic activities;Therapeutic exercise;Balance training;Neuromuscular re-education;Patient/family education;Prosthetic Training;Manual techniques    PT Next Visit Plan  review prosthetic care, progressively increase wear, HEP at sink for midline/standing exercises, prosthetic gait with RW    Consulted and Agree with Plan of Care  Patient       Patient will benefit from skilled therapeutic intervention in order to improve the following deficits and impairments:  Abnormal gait, Decreased activity tolerance, Decreased balance, Decreased endurance, Decreased knowledge of use of DME, Decreased mobility, Decreased range of motion, Decreased strength, Postural dysfunction, Prosthetic Dependency, Obesity, Pain  Visit Diagnosis: Other abnormalities of gait and mobility  Stiffness of right hip, not elsewhere classified  Muscle weakness (generalized)  Unsteadiness on feet  Abnormal posture     Problem List Patient Active Problem List   Diagnosis Date Noted  . History of nausea and vomiting 10/31/2016  . Prosthesis adjustment 08/17/2016  . History of pulmonary embolus (PE) 06/09/2016  . Normocytic anemia 01/30/2014  . Above knee amputation of right lower extremity ([place]) 12/26/2013  . GERD (gastroesophageal reflux disease) 08/14/2013  . Hyperlipidemia 02/19/2013  . S/P cardiac cath, 02/18/13, normal coronaries 02/19/2013  . CKD (chronic kidney disease) stage 3, GFR 30-59 ml/min ([facility]CC) 02/19/2013  . Nephrotic syndrome 02/12/2012  . Essential hypertension 07/24/2011  . Long term current use of anticoagulant therapy 07/19/2011  . Back pain 07/12/2011  . History of DVT (deep vein thrombosis) 07/28/2009  . PROTEINURIA, MILD 02/01/2007  . Morbid obesity ([place]) 10/26/2005  . DM (diabetes mellitus) type I uncontrolled, periph vascular disorder ([place]) 01/09/2002    Breezie Micucci PT, DPT 12/07/2016, 12:41 PM  [facility] [address] [place], [place], [address] Phone: [phone]   Fax:  [phone]  Name: [name] MRN: [id number] Date of Birth: [birth year]

## 2016-12-07 NOTE — Patient Instructions (Addendum)
1/2" thick plywood 19 1/4" X 15" with edges sanded so not rough.  Place under cushion to stop hammocking of w/c seat.  Also when sitting with prosthesis on your leg, put folded towel or throw pillow under left buttocks & thigh to level your pelvis.    Stand with counter or support behind you and walker or something to hold in front of you. All exercises are with feet beside each other. You can use either resistance bands or pulley weights.   High Row: Single Arm    Row with one arm holding walker with other arm. Repeat 10__ times per set. Repeat with other arm. Do _2-3_ sets per session. Anchor Height: Chest  http://tub.exer.us/69   Copyright  VHI. All rights reserved.   Two-Arm Chest Press: Toe-Lunge Stance   hands close to chest holding tubing, thumbs in. Extend arms forward, keeping elbows close to sides. Repeat with other arm.  Do __10__ repetitions each arm, __2-3__ sets. Anchor at shoulder level, behind.   http://bt.exer.us/210   Copyright  VHI. All rights reserved.  Curl: Palm Up (Single Arm)    Biceps curl. Palm up, curl arm toward shoulder. Repeat _10_ times each arm per set. Repeat with other arm. Do __2-3  sets per session.   http://tub.exer.us/5   Copyright  VHI. All rights reserved.  FLEXION: Standing - Stable: Resistance Band (Active)    Stand with right arm at side. Against resistance band, lift arm forward and up as high as possible, keeping elbow straight. Complete __2-3_ sets of _10__ repetitions.   Copyright  VHI. All rights reserved.

## 2016-12-11 ENCOUNTER — Telehealth: Payer: Self-pay | Admitting: *Deleted

## 2016-12-11 NOTE — Telephone Encounter (Signed)
Pt called about recall of amlodipine, he is advised to call pharmacy and confirm the [facility]. Of his med and what he needs to do, he is agreeable

## 2016-12-13 ENCOUNTER — Ambulatory Visit: Payer: Medicare Other | Admitting: Physical Therapy

## 2016-12-14 ENCOUNTER — Telehealth: Payer: Self-pay | Admitting: Internal Medicine

## 2016-12-14 ENCOUNTER — Encounter: Payer: Self-pay | Admitting: Physical Therapy

## 2016-12-14 ENCOUNTER — Ambulatory Visit: Payer: Medicare Other | Attending: Internal Medicine | Admitting: Physical Therapy

## 2016-12-14 DIAGNOSIS — R2689 Other abnormalities of gait and mobility: Secondary | ICD-10-CM

## 2016-12-14 DIAGNOSIS — M25651 Stiffness of right hip, not elsewhere classified: Secondary | ICD-10-CM | POA: Insufficient documentation

## 2016-12-14 DIAGNOSIS — R2681 Unsteadiness on feet: Secondary | ICD-10-CM | POA: Insufficient documentation

## 2016-12-14 DIAGNOSIS — R293 Abnormal posture: Secondary | ICD-10-CM | POA: Insufficient documentation

## 2016-12-14 DIAGNOSIS — M6281 Muscle weakness (generalized): Secondary | ICD-10-CM | POA: Insufficient documentation

## 2016-12-14 NOTE — Therapy (Signed)
[facility] [address] [place], [place], [address] Phone: [phone]   Fax:  [phone]  Physical Therapy Treatment  Patient Details  Name: [name] MRN: [id number] Date of Birth: 1975/02/26 Referring Provider: Kalman Shan, DO   Encounter Date: 12/14/2016  PT End of Session - 12/14/16 0901    Visit Number  4    Number of Visits  33    Date for PT Re-Evaluation  01/19/17    Authorization Type  Medicare G-Code & progress note every 10th visit & Medicaid    PT Start Time  [phone]    PT Stop Time  0932    PT Time Calculation (min)  43 min    Equipment Utilized During Treatment  Gait belt    Activity Tolerance  Patient tolerated treatment well    Behavior During Therapy  [facility] for tasks assessed/performed       Past Medical History:  Diagnosis Date  . Acute venous embolism and thrombosis of deep vessels of proximal lower extremity ([place]) 07/19/2011  . Anemia   . Chest pain, neg MI, normal coronaries by cath 02/18/2013  . CKD (chronic kidney disease) stage 3, GFR 30-59 ml/min (HCC) 02/19/2013  . Colon polyps   . Diabetic ulcer of right foot ([place])   . DVT (deep venous thrombosis) ([place]) 09/2002   patient reports additional DVTs in '06 & '11 (unconfirmed)  . GERD (gastroesophageal reflux disease)   . History of blood transfusion    "related to OR" (10/31/2016)  . Hyperlipidemia 02/19/2013  . Hypertension   . Nausea & vomiting    "constant for the last couple weeks" (10/31/2016)  . Nephrotic syndrome   . Obesity    BMI 44, weight 346 pounds 01/30/14  . Pulmonary embolism ([place]) 09/2002   treated with 6 months of warfarin  . Type I diabetes mellitus ([place]) dx'd 2001    Past Surgical History:  Procedure Laterality Date  . AMPUTATION Right 12/22/2013   Procedure: AMPUTATION BELOW KNEE;  Surgeon: [name], MD;  Location: [place];  Service: Orthopedics;  Laterality: Right;  . AMPUTATION Right 02/19/2014   Procedure:  RIGHT ABOVE KNEE AMPUTATION ;  Surgeon: [name], MD;  Location: [place];  Service: Orthopedics;  Laterality: Right;  . CARDIAC CATHETERIZATION  02/18/2013   normal coronaries  . ESOPHAGOGASTRODUODENOSCOPY N/A 11/02/2016   Procedure: ESOPHAGOGASTRODUODENOSCOPY (EGD);  Surgeon: [name], MD;  Location: [facility] ENDOSCOPY;  Service: Endoscopy;  Laterality: N/A;  . I&D EXTREMITY Right 12/14/2013   Procedure: IRRIGATION AND DEBRIDEMENT RIGHT FOOT;  Surgeon: [name], MD;  Location: [place];  Service: Orthopedics;  Laterality: Right;  . INCISION AND DRAINAGE ABSCESS  2007; 2015   "back"  . INCISION AND DRAINAGE OF WOUND Right 12/22/2013   Procedure: I&D RIGHT BUTTOCK;  Surgeon: [name], MD;  Location: [place];  Service: Orthopedics;  Laterality: Right;  . LEFT HEART CATHETERIZATION WITH CORONARY ANGIOGRAM N/A 02/18/2013   Procedure: LEFT HEART CATHETERIZATION WITH CORONARY ANGIOGRAM;  Surgeon: [name], MD;  Location: [facility] CATH LAB;  Service: Cardiovascular;  Laterality: N/A;    There were no vitals filed for this visit.  Subjective Assessment - 12/14/16 0851    Subjective  Pt states he went to the gym and practiced some standing activities with one UE supported that the PT suggested, pt reports his trainer will attend next session. No falls or pain to report.     Pertinent History  Right TFA, Type  1 DM, HTN, chest pain, DVT/PE, nephrotic syndrome, morbid obesity, LBP    Patient Stated Goals  To walk with prosthesis in community and stand to perform ADLs at home.     Currently in Pain?  No/denies       [facility] Adult PT Treatment/Exercise - 12/14/16 0941      Transfers   Transfers  Sit to Stand    Sit to Stand  4: Min guard    Sit to Stand Details (indicate cue type and reason)  Pt required excessive UE support to stand and increased time but decreased time by end of session.     Stand to Sit  5: Supervision;With upper extremity assist;To chair/3-in-1    Stand to Sit Details  Cues  for slow/controlled descent      Ambulation/Gait   Ambulation/Gait  Yes    Ambulation/Gait Assistance  4: Min assist    Ambulation/Gait Assistance Details  Pt ambulating with RW requiring excessive UE support, cues for posture and to increase weightbearing/stance time on RLE.     Ambulation Distance (Feet)  70 Feet 40, standing rest break, 30    Assistive device  Rolling walker    Gait Pattern  Step-through pattern;Decreased stride length;Decreased stance time - right;Trunk flexed;Wide base of support;Poor foot clearance - right    Ambulation Surface  Level;Indoor      Neuro Re-ed    Neuro Re-ed Details   Pt standing at counter performing static standing and standing weightshifts fwd/back/side for proprioceptive feedback with prosthesis, cues for foot and trunk position in standing and posture. Pt required x2 seated rest breaks during activity.              PT Education - 12/14/16 [phone]    Education provided  Yes    Education Details  Demonstrated/Reviewed sink HEP, Therapist reviewed understanding of standing activities pt is performing at gym.     Person(s) Educated  Patient    Methods  Explanation;Demonstration;Tactile cues;Verbal cues;Handout    Comprehension  Verbalized understanding;Returned demonstration       PT Short Term Goals - 12/07/16 1237      PT SHORT TERM GOAL #1   Title  Patient demonstrates proper donning of prosthesis. (All STGs Target Date: 12/21/2016)    Baseline  Patient requires skilled instructin for proper donning of prosthesis.     Time  1    Period  Months    Status  New    Target Date  12/21/16      PT SHORT TERM GOAL #2   Title  Patient tolerates prosthesis wear daily >8 hrs total /day with limb pain or skin issues.     Baseline  Patient tolerates prosthesis wear ~ 6hrs total /day.     Time  1    Period  Months    Status  On-going    Target Date  12/21/16      PT SHORT TERM GOAL #3   Title  Patient performs standing balance activities with  RW & prosthesis reaching 5" with release for >5 seconds and scans with head & shoulder rotation with supervision.      Baseline  Standing balance with RW with excessive UE weight bearing: reaches 2" with <3sec release and turns head only to scan with close supervision.     Time  1    Period  Months    Status  On-going    Target Date  12/21/16      PT SHORT TERM GOAL #  4   Title  Patient ambulates 50' with RW & prosthesis with minA.     Baseline  Patient ambulates 63' with RW & prosthesis with minA with gait deviations impacting safety.     Time  1    Period  Months    Status  On-going    Target Date  12/21/16        PT Long Term Goals - 12/07/16 1237      PT LONG TERM GOAL #1   Title  Patient verbalizes & demonstrates proper prosthetic care to enable safe use of prosthesis. (Target Date: 03/16/2017)    Baseline  Patient is dependent in proper donning, wear & weight bearing adjustment of prosthesis.     Time  4    Period  Months    Status  On-going    Target Date  03/16/17      PT LONG TERM GOAL #2   Title  Patient tolerates daily wear of prosthesis >75% of awake hours without skin issues or limb pain to enable function throughout his day.    Baseline  Patient tolerates wear of prosthesis ~6 hrs /day which is ~1/3 of awake hours.     Time  4    Period  Months    Status  On-going    Target Date  03/16/17      PT LONG TERM GOAL #3   Title  Patient performs standing balance activities with rolling walker support & prosthesis reaching 10" anteriorly & to floor, manage clothes modified independent.      Baseline  Patient requires Excessive upper extremity support on RW. He reaches 2" only with release of RW <3 seconds to retrieve items and turn head only to scan environment.     Time  4    Period  Months    Status  On-going    Target Date  03/16/17      PT LONG TERM GOAL #4   Title  Patient ambulates 100' with RW & prosthesis modified independent to enable basic community access.      Baseline  Patient ambulates 52' with RW & Prosthesis with minimal assist requiring w/c to follow due to rapid decline & safety with gait deviations impacting safety.     Time  4    Period  Months    Status  On-going    Target Date  03/16/17      PT LONG TERM GOAL #5   Title  Patient negotiates ramp, curb with RW & stairs with 2 rails with transfemoral prosthesis modified independent to enable community access.      Baseline  Patient is dependent on level surface gait & unable to negotiate ramps, curbs or stairs.     Time  4    Period  Months    Status  On-going    Target Date  03/16/17            Plan - 12/14/16 0959    Clinical Impression Statement  Pt tolerated treatment well with no limitations due to pain but some due to fatigue. Todays session focused on prosthetic gait training and balance training for proprioceptive feedback to RLE. Pt would benefit from continued PT sessions to improve mobility.     Rehab Potential  Good    Clinical Impairments Affecting Rehab Potential  visit limitation with Medicaid, Morbid obesity, length of time with immobility / w/c bound, lives alone.     PT Frequency  2x / week  PT Duration  Other (comment) 16 weeks    PT Treatment/Interventions  ADLs/Self Care Home Management;Gait training;DME Instruction;Stair training;Functional mobility training;Therapeutic activities;Therapeutic exercise;Balance training;Neuromuscular re-education;Patient/family education;Prosthetic Training;Manual techniques    PT Next Visit Plan  review prosthetic care, progressively increase wear, Continue HEP at sink for midline/standing exercises, prosthetic gait with RW    Consulted and Agree with Plan of Care  Patient       Patient will benefit from skilled therapeutic intervention in order to improve the following deficits and impairments:  Abnormal gait, Decreased activity tolerance, Decreased balance, Decreased endurance, Decreased knowledge of use of DME,  Decreased mobility, Decreased range of motion, Decreased strength, Postural dysfunction, Prosthetic Dependency, Obesity, Pain  Visit Diagnosis: Other abnormalities of gait and mobility  Stiffness of right hip, not elsewhere classified  Muscle weakness (generalized)  Unsteadiness on feet  Abnormal posture     Problem List Patient Active Problem List   Diagnosis Date Noted  . History of nausea and vomiting 10/31/2016  . Prosthesis adjustment 08/17/2016  . History of pulmonary embolus (PE) 06/09/2016  . Normocytic anemia 01/30/2014  . Above knee amputation of right lower extremity ([place]) 12/26/2013  . GERD (gastroesophageal reflux disease) 08/14/2013  . Hyperlipidemia 02/19/2013  . S/P cardiac cath, 02/18/13, normal coronaries 02/19/2013  . CKD (chronic kidney disease) stage 3, GFR 30-59 ml/min (HCC) 02/19/2013  . Nephrotic syndrome 02/12/2012  . Essential hypertension 07/24/2011  . Long term current use of anticoagulant therapy 07/19/2011  . Back pain 07/12/2011  . History of DVT (deep vein thrombosis) 07/28/2009  . PROTEINURIA, MILD 02/01/2007  . Morbid obesity ([place]) 10/26/2005  . DM (diabetes mellitus) type I uncontrolled, periph vascular disorder ([place]) 01/09/2002   [name], SPTA  [name] 12/14/2016, 10:01 AM  [facility] [address] [place], [place], [address] Phone: [phone]   Fax:  [phone]  Name: [name] MRN: [id number] Date of Birth: 1975/05/22

## 2016-12-14 NOTE — Telephone Encounter (Signed)
Patient is requesting results from digestive study

## 2016-12-14 NOTE — Patient Instructions (Signed)
Do each exercise 1  times per day Do each exercise 10-15 repetitions Hold each exercise for 2-5 seconds to feel your location  AT [place].  USE TAPE ON FLOOR TO MARK THE MIDLINE POSITION. You also should try to feel with your limb pressure in socket.  You are trying to feel with limb what you used to feel with the bottom of your foot.  1. Side to Side Shift: Moving your hips only (not shoulders): move weight onto your left leg, HOLD/FEEL.  Move back to equal weight on each leg, HOLD/FEEL. Move weight onto your right leg, HOLD/FEEL. Move back to equal weight on each leg, HOLD/FEEL. Repeat. 2. Front to Back Shift: Moving your hips only (not shoulders): move your weight forward onto your toes, HOLD/FEEL. Move your weight back to equal Flat Foot on both legs, HOLD/FEEL. Move your weight back onto your heels, HOLD/FEEL. Move your weight back to equal on both legs, HOLD/FEEL. Repeat. 3. Moving Cones / Cups: With equal weight on each leg: Hold on with one hand the first time, then progress to no hand supports. Move cups from one side of sink to the other. Place cups ~2" out of your reach, progress to 10" beyond reach. 4. Overhead/Upward Reaching: alternated reaching up to top cabinets or ceiling if no cabinets present. Keep equal weight on each leg. Start with one hand support on counter while other hand reaches and progress to no hand support with reaching. 5.   Looking Over Shoulders: With equal weight on each leg: alternate turning to look          over your shoulders with one hand support on counter as needed. Shift weight to             side looking, pull hip then shoulder then head/eyes around to look behind you. Start       with one hand support & progress to no hand support.

## 2016-12-15 NOTE — Telephone Encounter (Signed)
Please let [name] know that his gastric emptying study showed mildly delayed gastric emptying.  He showed avoid foods that are are fatty, acidic, or spicy.  No other interventions at this time unless continues to have GI issues then we can discuss medications in the future.

## 2016-12-15 NOTE — Telephone Encounter (Signed)
Lm for rtc 

## 2016-12-19 ENCOUNTER — Ambulatory Visit: Payer: Medicare Other | Admitting: Physical Therapy

## 2016-12-20 NOTE — Telephone Encounter (Signed)
Relayed message

## 2016-12-21 ENCOUNTER — Ambulatory Visit: Payer: Medicare Other | Admitting: Physical Therapy

## 2016-12-21 ENCOUNTER — Telehealth: Payer: Self-pay | Admitting: Internal Medicine

## 2016-12-21 NOTE — Telephone Encounter (Signed)
Had spoken to pt previously, called him again and went over message from dr Heber [place] and examples of each food also what foods to eat. He was agreeable and will speak w/ dr Heber [place] at next appt

## 2016-12-21 NOTE — Telephone Encounter (Signed)
Patient checking on results from labs for his gastro testing perform in [facility], pls call patient

## 2016-12-26 ENCOUNTER — Encounter: Payer: Self-pay | Admitting: Physical Therapy

## 2016-12-26 ENCOUNTER — Ambulatory Visit: Payer: Medicare Other | Admitting: Physical Therapy

## 2016-12-26 DIAGNOSIS — R2689 Other abnormalities of gait and mobility: Secondary | ICD-10-CM

## 2016-12-26 DIAGNOSIS — M25651 Stiffness of right hip, not elsewhere classified: Secondary | ICD-10-CM | POA: Diagnosis not present

## 2016-12-26 DIAGNOSIS — M6281 Muscle weakness (generalized): Secondary | ICD-10-CM

## 2016-12-26 DIAGNOSIS — R293 Abnormal posture: Secondary | ICD-10-CM | POA: Diagnosis not present

## 2016-12-26 DIAGNOSIS — R2681 Unsteadiness on feet: Secondary | ICD-10-CM | POA: Diagnosis not present

## 2016-12-26 NOTE — Patient Instructions (Addendum)
Chest Press    Face away from anchor in stride stance. Grasp bar, palms down. Press arms out. Repeat __ times per set. Do __ sets per session. Do __ sessions per week. Anchor Height: Chest  [email or web address]   Copyright  VHI. All rights reserved.  Fly: Standing - Single Arm (Cable)    Cross arm just past midline of body under lower chest, keeping elbow and knees slightly bent. Do ____ sets. Complete ____ repetitions.  http://[email or web address]   Copyright  VHI. All rights reserved.  One-Arm Chest Press: Toe-Lunge Stance    In toe-lunge stance, same hand as front leg close to chest holding tubing, thumb in. Extend arm forward, keeping elbow close to side. Repeat with same arm, feet switched. Repeat sequence with other arm. Do ____ repetitions, ____ sets. Anchor at shoulder level, behind.  http://[email or web address].[email or web address]   Copyright  VHI. All rights reserved.  Press: Incline - Thumb Up (Single Arm)    Face away from anchor in stride stance, leg forward opposite exercising arm. Thumb up, press arm forward and up. Repeat __ times per set. Repeat with other arm. Do __ sets per session. Do __ sessions per week. Anchor Height: Knee  [email or web address]   Copyright  VHI. All rights reserved.  Press: Thumb Up (Single Arm)    Face away from anchor in stride stance, leg forward opposite exercising arm. Press arm forward with thumb up. Repeat __ times per set. Repeat with other arm. Do __ sets per session. Do __ sessions per week. Anchor Height: Chest  [email or web address]   Copyright  VHI. All rights reserved.  Arm Curl    In shoulder width stance, tubing under feet and palms up, curl arms. Repeat __ times per set. Do __ sets per session. Do __ sessions per week.  http://[email or web address].[email or web address]   Copyright  VHI. All rights reserved.  Arm Curl: Single Arm    In stride stance, anchor loop under one foot. With same side hand, thumb up, bend elbow toward  shoulder. Repeat __ times per set. Repeat with other arm. Do __ sets per session. Do __ sessions per week.  [email or web address]://tub.[email or web address].[email or web address]   Copyright  VHI. All rights reserved.  FLEXION: Standing - Stable: Resistance Band (Active)    Stand with right arm at side. Against yellow resistance band, lift arm forward and up as high as possible, keeping elbow straight. Complete ___ sets of ___ repetitions. Perform ___ sessions per day.  Copyright  VHI. All rights reserved.

## 2016-12-26 NOTE — Therapy (Signed)
[facility] [address] [place], [place], [address] Phone: [phone]   Fax:  [phone]  Physical Therapy Treatment  Patient Details  Name: [name] MRN: [id number] Date of Birth: 1975-09-30 Referring Provider: Kalman Shan, DO   Encounter Date: 12/26/2016  PT End of Session - 12/26/16 1302    Visit Number  5    Number of Visits  33    Date for PT Re-Evaluation  01/19/17    Authorization Type  Medicare G-Code & progress note every 10th visit & Medicaid    PT Start Time  0930    PT Stop Time  1015    PT Time Calculation (min)  45 min    Equipment Utilized During Treatment  Gait belt    Activity Tolerance  Patient tolerated treatment well    Behavior During Therapy  [facility] for tasks assessed/performed       Past Medical History:  Diagnosis Date  . Acute venous embolism and thrombosis of deep vessels of proximal lower extremity ([place]) 07/19/2011  . Anemia   . Chest pain, neg MI, normal coronaries by cath 02/18/2013  . CKD (chronic kidney disease) stage 3, GFR 30-59 ml/min (HCC) 02/19/2013  . Colon polyps   . Diabetic ulcer of right foot ([place])   . DVT (deep venous thrombosis) ([place]) 09/2002   patient reports additional DVTs in '06 & '11 (unconfirmed)  . GERD (gastroesophageal reflux disease)   . History of blood transfusion    "related to OR" (10/31/2016)  . Hyperlipidemia 02/19/2013  . Hypertension   . Nausea & vomiting    "constant for the last couple weeks" (10/31/2016)  . Nephrotic syndrome   . Obesity    BMI 44, weight 346 pounds 01/30/14  . Pulmonary embolism ([place]) 09/2002   treated with 6 months of warfarin  . Type I diabetes mellitus ([place]) dx'd 2001    Past Surgical History:  Procedure Laterality Date  . AMPUTATION Right 12/22/2013   Procedure: AMPUTATION BELOW KNEE;  Surgeon: [name], MD;  Location: [place];  Service: Orthopedics;  Laterality: Right;  . AMPUTATION Right 02/19/2014    Procedure: RIGHT ABOVE KNEE AMPUTATION ;  Surgeon: [name], MD;  Location: [place];  Service: Orthopedics;  Laterality: Right;  . CARDIAC CATHETERIZATION  02/18/2013   normal coronaries  . ESOPHAGOGASTRODUODENOSCOPY N/A 11/02/2016   Procedure: ESOPHAGOGASTRODUODENOSCOPY (EGD);  Surgeon: [name], MD;  Location: [facility] ENDOSCOPY;  Service: Endoscopy;  Laterality: N/A;  . I&D EXTREMITY Right 12/14/2013   Procedure: IRRIGATION AND DEBRIDEMENT RIGHT FOOT;  Surgeon: [name], MD;  Location: [place];  Service: Orthopedics;  Laterality: Right;  . INCISION AND DRAINAGE ABSCESS  2007; 2015   "back"  . INCISION AND DRAINAGE OF WOUND Right 12/22/2013   Procedure: I&D RIGHT BUTTOCK;  Surgeon: [name], MD;  Location: [place];  Service: Orthopedics;  Laterality: Right;  . LEFT HEART CATHETERIZATION WITH CORONARY ANGIOGRAM N/A 02/18/2013   Procedure: LEFT HEART CATHETERIZATION WITH CORONARY ANGIOGRAM;  Surgeon: [name], MD;  Location: [facility] CATH LAB;  Service: Cardiovascular;  Laterality: N/A;    There were no vitals filed for this visit.  Subjective Assessment - 12/26/16 0938    Subjective  No falls but he stayed inside mainly with snow that we had. Pt showed PT video of him standing with stable BUE support near rowing with resistive bands.     Pertinent History  Right TFA, Type 1 DM, HTN, chest pain, DVT/PE,  nephrotic syndrome, morbid obesity, LBP    Patient Stated Goals  To walk with prosthesis in community and stand to perform ADLs at home.     Currently in Pain?  No/denies      Prosthetic Training with Transfemoral prosthesis: Pt donned prosthesis correctly without verbal cues today.  See pt education.  Pt ambulated 6' with RW & prosthesis with supervision with cues on sequence, posture & wt shift.  Pt able to reach 5" with RW support safely.                         PT Education - 12/26/16 0930    Education provided  Yes    Education Details  Updated HEP for  standing with single UE support chest press, diagonal movements with band anterior & posterior, bicep curls & overhead reach. Leg press with prosthesis.     Person(s) Educated  Patient    Methods  Explanation;Demonstration;Verbal cues;Handout    Comprehension  Verbalized understanding;Returned demonstration;Need further instruction       PT Short Term Goals - 12/26/16 1303      PT SHORT TERM GOAL #1   Title  Patient demonstrates proper donning of prosthesis. (All STGs Target Date: 12/21/2016)    Baseline  MET 12/26/2016    Time  1    Period  Months    Status  Achieved      PT SHORT TERM GOAL #2   Title  Patient tolerates prosthesis wear daily >8 hrs total /day with limb pain or skin issues.     Baseline  MET 12/26/2016    Time  1    Period  Months    Status  Achieved      PT SHORT TERM GOAL #3   Title  Patient performs standing balance activities with RW & prosthesis reaching 5" with release for >5 seconds and scans with head & shoulder rotation with supervision.      Baseline  MET 12/26/2016    Time  1    Period  Months    Status  Achieved      PT SHORT TERM GOAL #4   Title  Patient ambulates 46' with RW & prosthesis with minA.     Baseline  MET 12/26/2016    Time  1    Period  Months    Status  Achieved        PT Long Term Goals - 12/07/16 1237      PT LONG TERM GOAL #1   Title  Patient verbalizes & demonstrates proper prosthetic care to enable safe use of prosthesis. (Target Date: 03/16/2017)    Baseline  Patient is dependent in proper donning, wear & weight bearing adjustment of prosthesis.     Time  4    Period  Months    Status  On-going    Target Date  03/16/17      PT LONG TERM GOAL #2   Title  Patient tolerates daily wear of prosthesis >75% of awake hours without skin issues or limb pain to enable function throughout his day.    Baseline  Patient tolerates wear of prosthesis ~6 hrs /day which is ~1/3 of awake hours.     Time  4    Period  Months    Status   On-going    Target Date  03/16/17      PT LONG TERM GOAL #3   Title  Patient performs standing balance activities  with rolling walker support & prosthesis reaching 10" anteriorly & to floor, manage clothes modified independent.      Baseline  Patient requires Excessive upper extremity support on RW. He reaches 2" only with release of RW <3 seconds to retrieve items and turn head only to scan environment.     Time  4    Period  Months    Status  On-going    Target Date  03/16/17      PT LONG TERM GOAL #4   Title  Patient ambulates 100' with RW & prosthesis modified independent to enable basic community access.     Baseline  Patient ambulates 10' with RW & Prosthesis with minimal assist requiring w/c to follow due to rapid decline & safety with gait deviations impacting safety.     Time  4    Period  Months    Status  On-going    Target Date  03/16/17      PT LONG TERM GOAL #5   Title  Patient negotiates ramp, curb with RW & stairs with 2 rails with transfemoral prosthesis modified independent to enable community access.      Baseline  Patient is dependent on level surface gait & unable to negotiate ramps, curbs or stairs.     Time  4    Period  Months    Status  On-going    Target Date  03/16/17            Plan - 12/26/16 1304    Clinical Impression Statement  patient met all 5 STGs set for first 30 days. He is progressing with gait & balance with prosthesis with RW support.     Rehab Potential  Good    Clinical Impairments Affecting Rehab Potential  visit limitation with Medicaid, Morbid obesity, length of time with immobility / w/c bound, lives alone.     PT Frequency  2x / week    PT Duration  Other (comment) 16 weeks    PT Treatment/Interventions  ADLs/Self Care Home Management;Gait training;DME Instruction;Stair training;Functional mobility training;Therapeutic activities;Therapeutic exercise;Balance training;Neuromuscular re-education;Patient/family education;Prosthetic  Training;Manual techniques    PT Next Visit Plan  prosthetic gait with RW, initiate stairs, ramps & curbs    Consulted and Agree with Plan of Care  Patient       Patient will benefit from skilled therapeutic intervention in order to improve the following deficits and impairments:  Abnormal gait, Decreased activity tolerance, Decreased balance, Decreased endurance, Decreased knowledge of use of DME, Decreased mobility, Decreased range of motion, Decreased strength, Postural dysfunction, Prosthetic Dependency, Obesity, Pain  Visit Diagnosis: Other abnormalities of gait and mobility  Muscle weakness (generalized)  Unsteadiness on feet     Problem List Patient Active Problem List   Diagnosis Date Noted  . History of nausea and vomiting 10/31/2016  . Prosthesis adjustment 08/17/2016  . History of pulmonary embolus (PE) 06/09/2016  . Normocytic anemia 01/30/2014  . Above knee amputation of right lower extremity ([place]) 12/26/2013  . GERD (gastroesophageal reflux disease) 08/14/2013  . Hyperlipidemia 02/19/2013  . S/P cardiac cath, 02/18/13, normal coronaries 02/19/2013  . CKD (chronic kidney disease) stage 3, GFR 30-59 ml/min (HCC) 02/19/2013  . Nephrotic syndrome 02/12/2012  . Essential hypertension 07/24/2011  . Long term current use of anticoagulant therapy 07/19/2011  . Back pain 07/12/2011  . History of DVT (deep vein thrombosis) 07/28/2009  . PROTEINURIA, MILD 02/01/2007  . Morbid obesity ([place]) 10/26/2005  . DM (diabetes mellitus) type I uncontrolled,  periph vascular disorder ([place]) 01/09/2002    Jamey Reas, PT, DPT 12/26/2016, 1:07 PM  [facility] [address] [place], [place], [address] Phone: [phone]   Fax:  [phone]  Name: Wadie Liew MRN: [id number] Date of Birth: 1975-08-18

## 2016-12-27 DIAGNOSIS — Z89619 Acquired absence of unspecified leg above knee: Secondary | ICD-10-CM | POA: Diagnosis not present

## 2016-12-27 DIAGNOSIS — Z6841 Body Mass Index (BMI) 40.0 and over, adult: Secondary | ICD-10-CM | POA: Diagnosis not present

## 2016-12-27 DIAGNOSIS — N183 Chronic kidney disease, stage 3 (moderate): Secondary | ICD-10-CM | POA: Diagnosis not present

## 2016-12-27 DIAGNOSIS — I129 Hypertensive chronic kidney disease with stage 1 through stage 4 chronic kidney disease, or unspecified chronic kidney disease: Secondary | ICD-10-CM | POA: Diagnosis not present

## 2016-12-27 DIAGNOSIS — Z794 Long term (current) use of insulin: Secondary | ICD-10-CM | POA: Diagnosis not present

## 2016-12-27 DIAGNOSIS — N2581 Secondary hyperparathyroidism of renal origin: Secondary | ICD-10-CM | POA: Diagnosis not present

## 2016-12-27 DIAGNOSIS — E1122 Type 2 diabetes mellitus with diabetic chronic kidney disease: Secondary | ICD-10-CM | POA: Diagnosis not present

## 2016-12-27 DIAGNOSIS — Z86718 Personal history of other venous thrombosis and embolism: Secondary | ICD-10-CM | POA: Diagnosis not present

## 2016-12-27 DIAGNOSIS — D631 Anemia in chronic kidney disease: Secondary | ICD-10-CM | POA: Diagnosis not present

## 2016-12-27 NOTE — Progress Notes (Deleted)
   CC: Follow up on essential hypertension  HPI:  [name] is a 41 y.o. male with history noted below the presents of internal medicine clinic for follow-up on essential hypertension. Please see probably starting for the status of patient's chronic medical conditions.  Past Medical History:  Diagnosis Date  . Acute venous embolism and thrombosis of deep vessels of proximal lower extremity ([place]) 07/19/2011  . Anemia   . Chest pain, neg MI, normal coronaries by cath 02/18/2013  . CKD (chronic kidney disease) stage 3, GFR 30-59 ml/min (HCC) 02/19/2013  . Colon polyps   . Diabetic ulcer of right foot ([place])   . DVT (deep venous thrombosis) ([place]) 09/2002   patient reports additional DVTs in '06 & '11 (unconfirmed)  . GERD (gastroesophageal reflux disease)   . History of blood transfusion    "related to OR" (10/31/2016)  . Hyperlipidemia 02/19/2013  . Hypertension   . Nausea & vomiting    "constant for the last couple weeks" (10/31/2016)  . Nephrotic syndrome   . Obesity    BMI 44, weight 346 pounds 01/30/14  . Pulmonary embolism ([place]) 09/2002   treated with 6 months of warfarin  . Type I diabetes mellitus ([place]) dx'd 2001    Review of Systems:  ***  Physical Exam:  There were no vitals filed for this visit. ***  Assessment & Plan:   See encounters tab for problem based medical decision making.  Assessment: Essential hypertension Patient currently takes carvedilol 25 mg BID, Lasix 80 mg BID metolazone 2.5 mg every other day, clonidine 0.2 mg BID, and amlodipine 5 mg daily. Blood pressure is ***  Assessment: Morbid obesity  Patient states that he has been doing physical therapy and working out at the gym. He is asking for supplements today to help with weight loss. We discussed orlistat.   Assessment:  Healthcare maintenance Foot exam   Patient {GC/GE:3044014::"discussed with","seen with"} Dr. {NAMES:[id number]::"Butcher","Gran[place]","E.  Temprence Rhines","Klima","Mullen","Narendra","Vincent"}

## 2016-12-28 ENCOUNTER — Encounter: Payer: Self-pay | Admitting: Physical Therapy

## 2016-12-28 ENCOUNTER — Ambulatory Visit: Payer: Medicare Other | Admitting: Physical Therapy

## 2016-12-28 ENCOUNTER — Encounter: Payer: Medicare Other | Admitting: Internal Medicine

## 2016-12-28 DIAGNOSIS — R2681 Unsteadiness on feet: Secondary | ICD-10-CM

## 2016-12-28 DIAGNOSIS — R2689 Other abnormalities of gait and mobility: Secondary | ICD-10-CM

## 2016-12-28 DIAGNOSIS — R293 Abnormal posture: Secondary | ICD-10-CM

## 2016-12-28 DIAGNOSIS — M6281 Muscle weakness (generalized): Secondary | ICD-10-CM

## 2016-12-28 DIAGNOSIS — M25651 Stiffness of right hip, not elsewhere classified: Secondary | ICD-10-CM | POA: Diagnosis not present

## 2016-12-28 NOTE — Therapy (Signed)
[facility] [address] [place], [place], [address] Phone: [phone]   Fax:  [phone]  Physical Therapy Treatment  Patient Details  Name: [name] MRN: [id number] Date of Birth: [birth year] Referring Provider: Kalman Shan, DO   Encounter Date: 12/28/2016  PT End of Session - 12/28/16 2126    Visit Number  6    Number of Visits  33    Date for PT Re-Evaluation  01/19/17    Authorization Type  Medicare G-Code & progress note every 10th visit & Medicaid    PT Start Time  0845    PT Stop Time  0930    PT Time Calculation (min)  45 min    Equipment Utilized During Treatment  Gait belt    Activity Tolerance  Patient tolerated treatment well    Behavior During Therapy  [facility] for tasks assessed/performed       Past Medical History:  Diagnosis Date  . Acute venous embolism and thrombosis of deep vessels of proximal lower extremity ([place]) 07/19/2011  . Anemia   . Chest pain, neg MI, normal coronaries by cath 02/18/2013  . CKD (chronic kidney disease) stage 3, GFR 30-59 ml/min (HCC) 02/19/2013  . Colon polyps   . Diabetic ulcer of right foot ([place])   . DVT (deep venous thrombosis) ([place]) 09/2002   patient reports additional DVTs in '06 & '11 (unconfirmed)  . GERD (gastroesophageal reflux disease)   . History of blood transfusion    "related to OR" (10/31/2016)  . Hyperlipidemia 02/19/2013  . Hypertension   . Nausea & vomiting    "constant for the last couple weeks" (10/31/2016)  . Nephrotic syndrome   . Obesity    BMI 44, weight 346 pounds 01/30/14  . Pulmonary embolism ([place]) 09/2002   treated with 6 months of warfarin  . Type I diabetes mellitus ([place]) dx'd 2001    Past Surgical History:  Procedure Laterality Date  . AMPUTATION Right 12/22/2013   Procedure: AMPUTATION BELOW KNEE;  Surgeon: [name], MD;  Location: [place];  Service: Orthopedics;  Laterality: Right;  . AMPUTATION Right 02/19/2014   Procedure: RIGHT ABOVE KNEE AMPUTATION ;  Surgeon: [name], MD;  Location: [place];  Service: Orthopedics;  Laterality: Right;  . CARDIAC CATHETERIZATION  02/18/2013   normal coronaries  . ESOPHAGOGASTRODUODENOSCOPY N/A 11/02/2016   Procedure: ESOPHAGOGASTRODUODENOSCOPY (EGD);  Surgeon: [name], MD;  Location: [facility] ENDOSCOPY;  Service: Endoscopy;  Laterality: N/A;  . I&D EXTREMITY Right 12/14/2013   Procedure: IRRIGATION AND DEBRIDEMENT RIGHT FOOT;  Surgeon: [name], MD;  Location: [place];  Service: Orthopedics;  Laterality: Right;  . INCISION AND DRAINAGE ABSCESS  2007; 2015   "back"  . INCISION AND DRAINAGE OF WOUND Right 12/22/2013   Procedure: I&D RIGHT BUTTOCK;  Surgeon: [name], MD;  Location: [place];  Service: Orthopedics;  Laterality: Right;  . LEFT HEART CATHETERIZATION WITH CORONARY ANGIOGRAM N/A 02/18/2013   Procedure: LEFT HEART CATHETERIZATION WITH CORONARY ANGIOGRAM;  Surgeon: [name], MD;  Location: [facility] CATH LAB;  Service: Cardiovascular;  Laterality: N/A;    There were no vitals filed for this visit.  Subjective Assessment - 12/28/16 0852    Subjective  No issues. He is standing at home more. He did not sleep last night.     Pertinent History  Right TFA, Type 1 DM, HTN, chest pain, DVT/PE, nephrotic syndrome, morbid obesity, LBP    Patient Stated Goals  To walk with  prosthesis in community and stand to perform ADLs at home.     Currently in Pain?  No/denies                      [facility] Adult PT Treatment/Exercise - 12/28/16 0845      Transfers   Transfers  Sit to Stand    Sit to Stand  5: Supervision;With upper extremity assist;From chair/3-in-1 to RW    Stand to Sit  5: Supervision;With upper extremity assist;To chair/3-in-1 from RW      Ambulation/Gait   Ambulation/Gait  Yes    Ambulation/Gait Assistance  4: Min guard    Ambulation/Gait Assistance Details  verbal cues on upright posture, decreasing internal rotation bilaterally, PT  demo how to have family following with w/c so can stop rest as needed.     Ambulation Distance (Feet)  40 Feet 40'. 30', 10'     Assistive device  Rolling walker;Prosthesis    Gait Pattern  Step-through pattern;Decreased stride length;Decreased stance time - right;Trunk flexed;Wide base of support;Poor foot clearance - right    Ambulation Surface  Indoor;Level      Neuro Re-ed    Neuro Re-ed Details   --      Prosthetics   Prosthetic Care Comments   Donning liner & prosthesis with proper rotation. Increasing prosthesis wear to 5hrs 2x/day    Current prosthetic wear tolerance (days/week)   daily    Current prosthetic wear tolerance (#hours/day)   4hrs 2x/day    Residual limb condition   No open areas. Incision healed with no adherances. Normal color, temperature and moisture level. Minimal hair growth. No adductor roll.     Education Provided  Skin check;Proper Donning;Proper wear schedule/adjustment    Person(s) Educated  Patient    Education Method  Explanation;Verbal cues    Education Method  Verbalized understanding;Needs further instruction             PT Education - 12/28/16 0915    Education provided  Yes    Education Details  seated 4 point sequencing exercise to prepare for crutch prosthetic gait    Person(s) Educated  Patient    Methods  Explanation;Demonstration;Tactile cues;Verbal cues;Handout    Comprehension  Verbalized understanding;Returned demonstration;Verbal cues required;Tactile cues required;Need further instruction       PT Short Term Goals - 12/28/16 2128      PT SHORT TERM GOAL #1   Title  Patient verbalizes working with prosthetist on socket revision. (All STGs Target Date: 01/20/2016)    Time  3    Period  Weeks    Status  New    Target Date  01/19/17      PT SHORT TERM GOAL #2   Title  Patient tolerates prosthesis wear daily >12 hrs total /day with limb pain or skin issues.     Time  3    Period  Weeks    Status  New    Target Date  01/19/17       PT SHORT TERM GOAL #3   Title  Patient stands with single crutch support & reaches 10" safely with supervision.     Time  3    Period  Weeks    Status  New    Target Date  01/19/17      PT SHORT TERM GOAL #4   Title  Patient ambulates 27' with crutches & prosthesis with minA.     Time  3    Period  Weeks    Status  New    Target Date  01/19/17        PT Long Term Goals - 12/07/16 1237      PT LONG TERM GOAL #1   Title  Patient verbalizes & demonstrates proper prosthetic care to enable safe use of prosthesis. (Target Date: 03/16/2017)    Baseline  Patient is dependent in proper donning, wear & weight bearing adjustment of prosthesis.     Time  4    Period  Months    Status  On-going    Target Date  03/16/17      PT LONG TERM GOAL #2   Title  Patient tolerates daily wear of prosthesis >75% of awake hours without skin issues or limb pain to enable function throughout his day.    Baseline  Patient tolerates wear of prosthesis ~6 hrs /day which is ~1/3 of awake hours.     Time  4    Period  Months    Status  On-going    Target Date  03/16/17      PT LONG TERM GOAL #3   Title  Patient performs standing balance activities with rolling walker support & prosthesis reaching 10" anteriorly & to floor, manage clothes modified independent.      Baseline  Patient requires Excessive upper extremity support on RW. He reaches 2" only with release of RW <3 seconds to retrieve items and turn head only to scan environment.     Time  4    Period  Months    Status  On-going    Target Date  03/16/17      PT LONG TERM GOAL #4   Title  Patient ambulates 100' with RW & prosthesis modified independent to enable basic community access.     Baseline  Patient ambulates 32' with RW & Prosthesis with minimal assist requiring w/c to follow due to rapid decline & safety with gait deviations impacting safety.     Time  4    Period  Months    Status  On-going    Target Date  03/16/17      PT LONG  TERM GOAL #5   Title  Patient negotiates ramp, curb with RW & stairs with 2 rails with transfemoral prosthesis modified independent to enable community access.      Baseline  Patient is dependent on level surface gait & unable to negotiate ramps, curbs or stairs.     Time  4    Period  Months    Status  On-going    Target Date  03/16/17            Plan - 12/28/16 2131    Clinical Impression Statement  Patient tolerated less activities today due to sleep deprivation last night per pt report. He seems to understand 4 point sequencing exercise which should enable trial of crutches easier.     Rehab Potential  Good    Clinical Impairments Affecting Rehab Potential  visit limitation with Medicaid, Morbid obesity, length of time with immobility / w/c bound, lives alone.     PT Frequency  2x / week    PT Duration  Other (comment) 16 weeks    PT Treatment/Interventions  ADLs/Self Care Home Management;Gait training;DME Instruction;Stair training;Functional mobility training;Therapeutic activities;Therapeutic exercise;Balance training;Neuromuscular re-education;Patient/family education;Prosthetic Training;Manual techniques    PT Next Visit Plan  prosthetic gait with RW, initiate stairs, ramps & curbs, try axillary crutches    Consulted and Agree with  Plan of Care  Patient       Patient will benefit from skilled therapeutic intervention in order to improve the following deficits and impairments:  Abnormal gait, Decreased activity tolerance, Decreased balance, Decreased endurance, Decreased knowledge of use of DME, Decreased mobility, Decreased range of motion, Decreased strength, Postural dysfunction, Prosthetic Dependency, Obesity, Pain  Visit Diagnosis: Other abnormalities of gait and mobility  Muscle weakness (generalized)  Unsteadiness on feet  Abnormal posture     Problem List Patient Active Problem List   Diagnosis Date Noted  . History of nausea and vomiting 10/31/2016  .  Prosthesis adjustment 08/17/2016  . History of pulmonary embolus (PE) 06/09/2016  . Normocytic anemia 01/30/2014  . Above knee amputation of right lower extremity ([place]) 12/26/2013  . GERD (gastroesophageal reflux disease) 08/14/2013  . Hyperlipidemia 02/19/2013  . S/P cardiac cath, 02/18/13, normal coronaries 02/19/2013  . CKD (chronic kidney disease) stage 3, GFR 30-59 ml/min (HCC) 02/19/2013  . Nephrotic syndrome 02/12/2012  . Essential hypertension 07/24/2011  . Long term current use of anticoagulant therapy 07/19/2011  . Back pain 07/12/2011  . History of DVT (deep vein thrombosis) 07/28/2009  . PROTEINURIA, MILD 02/01/2007  . Morbid obesity ([place]) 10/26/2005  . DM (diabetes mellitus) type I uncontrolled, periph vascular disorder ([place]) 01/09/2002    Juvencio Verdi PT, DPT 12/28/2016, 9:34 PM  [facility] [address] [place], [place], [address] Phone: [phone]   Fax:  [phone]  Name: [name] MRN: [id number] Date of Birth: Oct 18, 1975

## 2016-12-29 ENCOUNTER — Encounter: Payer: Self-pay | Admitting: Internal Medicine

## 2017-01-01 ENCOUNTER — Encounter: Payer: Self-pay | Admitting: Physical Therapy

## 2017-01-01 ENCOUNTER — Ambulatory Visit: Payer: Medicare Other | Admitting: Physical Therapy

## 2017-01-01 DIAGNOSIS — R2689 Other abnormalities of gait and mobility: Secondary | ICD-10-CM | POA: Diagnosis not present

## 2017-01-01 DIAGNOSIS — R2681 Unsteadiness on feet: Secondary | ICD-10-CM | POA: Diagnosis not present

## 2017-01-01 DIAGNOSIS — R293 Abnormal posture: Secondary | ICD-10-CM

## 2017-01-01 DIAGNOSIS — M25651 Stiffness of right hip, not elsewhere classified: Secondary | ICD-10-CM | POA: Diagnosis not present

## 2017-01-01 DIAGNOSIS — M6281 Muscle weakness (generalized): Secondary | ICD-10-CM | POA: Diagnosis not present

## 2017-01-02 NOTE — Therapy (Signed)
[facility] [address] [place], [place], [address] Phone: [phone]   Fax:  [phone]  Physical Therapy Treatment  Patient Details  Name: [name] MRN: [id number] Date of Birth: Jun 12, 1975 Referring Provider: Kalman Shan, DO   Encounter Date: 01/01/2017  PT End of Session - 01/01/17 2354    Visit Number  7    Number of Visits  33    Date for PT Re-Evaluation  01/19/17    Authorization Type  Medicare G-Code & progress note every 10th visit & Medicaid    PT Start Time  [phone]    PT Stop Time  0931    PT Time Calculation (min)  39 min    Equipment Utilized During Treatment  Gait belt    Activity Tolerance  Patient tolerated treatment well    Behavior During Therapy  [facility] for tasks assessed/performed       Past Medical History:  Diagnosis Date  . Acute venous embolism and thrombosis of deep vessels of proximal lower extremity ([place]) 07/19/2011  . Anemia   . Chest pain, neg MI, normal coronaries by cath 02/18/2013  . CKD (chronic kidney disease) stage 3, GFR 30-59 ml/min (HCC) 02/19/2013  . Colon polyps   . Diabetic ulcer of right foot ([place])   . DVT (deep venous thrombosis) ([place]) 09/2002   patient reports additional DVTs in '06 & '11 (unconfirmed)  . GERD (gastroesophageal reflux disease)   . History of blood transfusion    "related to OR" (10/31/2016)  . Hyperlipidemia 02/19/2013  . Hypertension   . Nausea & vomiting    "constant for the last couple weeks" (10/31/2016)  . Nephrotic syndrome   . Obesity    BMI 44, weight 346 pounds 01/30/14  . Pulmonary embolism ([place]) 09/2002   treated with 6 months of warfarin  . Type I diabetes mellitus ([place]) dx'd 2001    Past Surgical History:  Procedure Laterality Date  . AMPUTATION Right 12/22/2013   Procedure: AMPUTATION BELOW KNEE;  Surgeon: [name], MD;  Location: [facility];  Service: Orthopedics;  Laterality: Right;  . AMPUTATION Right 02/19/2014   Procedure: RIGHT ABOVE KNEE AMPUTATION ;  Surgeon: [name], MD;  Location: [facility];  Service: Orthopedics;  Laterality: Right;  . CARDIAC CATHETERIZATION  02/18/2013   normal coronaries  . ESOPHAGOGASTRODUODENOSCOPY N/A 11/02/2016   Procedure: ESOPHAGOGASTRODUODENOSCOPY (EGD);  Surgeon: [name], MD;  Location: [facility] ENDOSCOPY;  Service: Endoscopy;  Laterality: N/A;  . I&D EXTREMITY Right 12/14/2013   Procedure: IRRIGATION AND DEBRIDEMENT RIGHT FOOT;  Surgeon: [name], MD;  Location: [facility];  Service: Orthopedics;  Laterality: Right;  . INCISION AND DRAINAGE ABSCESS  2007; 2015   "back"  . INCISION AND DRAINAGE OF WOUND Right 12/22/2013   Procedure: I&D RIGHT BUTTOCK;  Surgeon: [name], MD;  Location: [facility];  Service: Orthopedics;  Laterality: Right;  . LEFT HEART CATHETERIZATION WITH CORONARY ANGIOGRAM N/A 02/18/2013   Procedure: LEFT HEART CATHETERIZATION WITH CORONARY ANGIOGRAM;  Surgeon: [name], MD;  Location: [facility] CATH LAB;  Service: Cardiovascular;  Laterality: N/A;    There were no vitals filed for this visit.  Subjective Assessment - 01/01/17 0859    Subjective  He added standing exercises with row, boxing, standing 5 sec without UE support    Patient is accompained by:  Family member girlfriend    Pertinent History  Right TFA, Type 1 DM, HTN, chest pain, DVT/PE, nephrotic syndrome, morbid obesity, LBP  Patient Stated Goals  To walk with prosthesis in community and stand to perform ADLs at home.     Currently in Pain?  No/denies                      [facility] Adult PT Treatment/Exercise - 01/01/17 0850      Transfers   Transfers  Sit to Stand    Sit to Stand  5: Supervision;With upper extremity assist;From chair/3-in-1;Multiple attempts to RW    Stand to Sit  5: Supervision;With upper extremity assist;To chair/3-in-1 from RW      Ambulation/Gait   Ambulation/Gait  Yes    Ambulation/Gait Assistance  4: Min guard    Ambulation Distance (Feet)   40 Feet 40'. 30', 10'     Assistive device  Rolling walker;Prosthesis    Gait Pattern  Step-through pattern;Decreased stride length;Decreased stance time - right;Trunk flexed;Wide base of support;Poor foot clearance - right    Ramp  5: Supervision RW & prosthesis    Ramp Details (indicate cue type and reason)  Demo & verbal cues on technique with prosthesis with wt shift & posture.    Curb  5: Supervision RW & prosthesis    Curb Details (indicate cue type and reason)  verbal cues on sequence &  technique      Prosthetics   Prosthetic Care Comments   wearing prosthesis 5hrs 2x/day. Increasing activity level with walking in home to change locations vs w/c. Progress to enter/exit apt via walking and following w/c to enter /exit public buildings to tolerance.     Current prosthetic wear tolerance (days/week)   daily    Current prosthetic wear tolerance (#hours/day)   4hrs 2x/day    Residual limb condition   No open areas. Incision healed with no adherances. Normal color, temperature and moisture level. Minimal hair growth. No adductor roll.     Education Provided  Skin check;Proper Donning;Proper wear schedule/adjustment;Other (comment) see prosthetic care comments    Person(s) Educated  Patient;Other (comment) girlfriend    Education Method  Explanation;Verbal cues    Education Method  Verbalized understanding;Needs further instruction             PT Education - 01/01/17 0900    Education provided  Yes    Education Details  Increasing activity level with ambulating with RW in apt over w/c, ambulating apt to/from car when exiting & initiating community outings by ambulating to tolerance with family following with w/c. Increasing wear to most of awake hours     Person(s) Educated  Patient;Other (comment) girlfriend    Methods  Explanation    Comprehension  Verbalized understanding       PT Short Term Goals - 12/28/16 2128      PT SHORT TERM GOAL #1   Title  Patient verbalizes working  with prosthetist on socket revision. (All STGs Target Date: 01/20/2016)    Time  3    Period  Weeks    Status  New    Target Date  01/19/17      PT SHORT TERM GOAL #2   Title  Patient tolerates prosthesis wear daily >12 hrs total /day with limb pain or skin issues.     Time  3    Period  Weeks    Status  New    Target Date  01/19/17      PT SHORT TERM GOAL #3   Title  Patient stands with single crutch support & reaches 10" safely  with supervision.     Time  3    Period  Weeks    Status  New    Target Date  01/19/17      PT SHORT TERM GOAL #4   Title  Patient ambulates 64' with crutches & prosthesis with minA.     Time  3    Period  Weeks    Status  New    Target Date  01/19/17        PT Long Term Goals - 12/07/16 1237      PT LONG TERM GOAL #1   Title  Patient verbalizes & demonstrates proper prosthetic care to enable safe use of prosthesis. (Target Date: 03/16/2017)    Baseline  Patient is dependent in proper donning, wear & weight bearing adjustment of prosthesis.     Time  4    Period  Months    Status  On-going    Target Date  03/16/17      PT LONG TERM GOAL #2   Title  Patient tolerates daily wear of prosthesis >75% of awake hours without skin issues or limb pain to enable function throughout his day.    Baseline  Patient tolerates wear of prosthesis ~6 hrs /day which is ~1/3 of awake hours.     Time  4    Period  Months    Status  On-going    Target Date  03/16/17      PT LONG TERM GOAL #3   Title  Patient performs standing balance activities with rolling walker support & prosthesis reaching 10" anteriorly & to floor, manage clothes modified independent.      Baseline  Patient requires Excessive upper extremity support on RW. He reaches 2" only with release of RW <3 seconds to retrieve items and turn head only to scan environment.     Time  4    Period  Months    Status  On-going    Target Date  03/16/17      PT LONG TERM GOAL #4   Title  Patient ambulates  100' with RW & prosthesis modified independent to enable basic community access.     Baseline  Patient ambulates 33' with RW & Prosthesis with minimal assist requiring w/c to follow due to rapid decline & safety with gait deviations impacting safety.     Time  4    Period  Months    Status  On-going    Target Date  03/16/17      PT LONG TERM GOAL #5   Title  Patient negotiates ramp, curb with RW & stairs with 2 rails with transfemoral prosthesis modified independent to enable community access.      Baseline  Patient is dependent on level surface gait & unable to negotiate ramps, curbs or stairs.     Time  4    Period  Months    Status  On-going    Target Date  03/16/17            Plan - 01/01/17 2358    Clinical Impression Statement  Patient verbalized understanding of need to increase activity level & prosthesis wear to increase function. Pt fatigues quickly with prosthetic gait.     Rehab Potential  Good    Clinical Impairments Affecting Rehab Potential  Morbid obesity, length of time with immobility / w/c bound, lives alone.     PT Frequency  2x / week    PT Duration  Other (comment)  16 weeks    PT Treatment/Interventions  ADLs/Self Care Home Management;Gait training;DME Instruction;Stair training;Functional mobility training;Therapeutic activities;Therapeutic exercise;Balance training;Neuromuscular re-education;Patient/family education;Prosthetic Training;Manual techniques    PT Next Visit Plan  prosthetic gait with RW, initiate stairs, ramps & curbs, try axillary crutches    Consulted and Agree with Plan of Care  Patient       Patient will benefit from skilled therapeutic intervention in order to improve the following deficits and impairments:  Abnormal gait, Decreased activity tolerance, Decreased balance, Decreased endurance, Decreased knowledge of use of DME, Decreased mobility, Decreased range of motion, Decreased strength, Postural dysfunction, Prosthetic Dependency,  Obesity, Pain  Visit Diagnosis: Other abnormalities of gait and mobility  Muscle weakness (generalized)  Unsteadiness on feet  Abnormal posture     Problem List Patient Active Problem List   Diagnosis Date Noted  . History of nausea and vomiting 10/31/2016  . Prosthesis adjustment 08/17/2016  . History of pulmonary embolus (PE) 06/09/2016  . Normocytic anemia 01/30/2014  . Above knee amputation of right lower extremity ([place]) 12/26/2013  . GERD (gastroesophageal reflux disease) 08/14/2013  . Hyperlipidemia 02/19/2013  . S/P cardiac cath, 02/18/13, normal coronaries 02/19/2013  . CKD (chronic kidney disease) stage 3, GFR 30-59 ml/min (HCC) 02/19/2013  . Nephrotic syndrome 02/12/2012  . Essential hypertension 07/24/2011  . Long term current use of anticoagulant therapy 07/19/2011  . Back pain 07/12/2011  . History of DVT (deep vein thrombosis) 07/28/2009  . PROTEINURIA, MILD 02/01/2007  . Morbid obesity ([place]) 10/26/2005  . DM (diabetes mellitus) type I uncontrolled, periph vascular disorder ([place]) 01/09/2002    Sokha Craker PT, DPT 01/02/2017, 12:06 AM  [facility] [address] [place], [place], [address] Phone: [phone]   Fax:  [phone]  Name: Jaquavious Mercer MRN: [id number] Date of Birth: March 23, 1975

## 2017-01-04 ENCOUNTER — Ambulatory Visit: Payer: Medicare Other | Admitting: Physical Therapy

## 2017-01-04 ENCOUNTER — Encounter: Payer: Self-pay | Admitting: Physical Therapy

## 2017-01-04 DIAGNOSIS — R2681 Unsteadiness on feet: Secondary | ICD-10-CM | POA: Diagnosis not present

## 2017-01-04 DIAGNOSIS — M6281 Muscle weakness (generalized): Secondary | ICD-10-CM | POA: Diagnosis not present

## 2017-01-04 DIAGNOSIS — M25651 Stiffness of right hip, not elsewhere classified: Secondary | ICD-10-CM | POA: Diagnosis not present

## 2017-01-04 DIAGNOSIS — R293 Abnormal posture: Secondary | ICD-10-CM | POA: Diagnosis not present

## 2017-01-04 DIAGNOSIS — R2689 Other abnormalities of gait and mobility: Secondary | ICD-10-CM

## 2017-01-05 NOTE — Therapy (Signed)
[facility] [address] [place], [place], [address] Phone: [phone]   Fax:  [phone]  Physical Therapy Treatment  Patient Details  Name: [name] MRN: [id number] Date of Birth: 10-01-1975 Referring Provider: Kalman Shan, DO   Encounter Date: 01/04/2017  PT End of Session - 01/04/17 1205    Visit Number  8    Number of Visits  33    Date for PT Re-Evaluation  01/19/17    Authorization Type  Medicare G-Code & progress note every 10th visit & Medicaid    PT Start Time  0845    PT Stop Time  0930    PT Time Calculation (min)  45 min    Equipment Utilized During Treatment  Gait belt    Activity Tolerance  Patient tolerated treatment well    Behavior During Therapy  [facility] for tasks assessed/performed       Past Medical History:  Diagnosis Date  . Acute venous embolism and thrombosis of deep vessels of proximal lower extremity ([place]) 07/19/2011  . Anemia   . Chest pain, neg MI, normal coronaries by cath 02/18/2013  . CKD (chronic kidney disease) stage 3, GFR 30-59 ml/min (HCC) 02/19/2013  . Colon polyps   . Diabetic ulcer of right foot ([place])   . DVT (deep venous thrombosis) ([place]) 09/2002   patient reports additional DVTs in '06 & '11 (unconfirmed)  . GERD (gastroesophageal reflux disease)   . History of blood transfusion    "related to OR" (10/31/2016)  . Hyperlipidemia 02/19/2013  . Hypertension   . Nausea & vomiting    "constant for the last couple weeks" (10/31/2016)  . Nephrotic syndrome   . Obesity    BMI 44, weight 346 pounds 01/30/14  . Pulmonary embolism ([place]) 09/2002   treated with 6 months of warfarin  . Type I diabetes mellitus ([place]) dx'd 2001    Past Surgical History:  Procedure Laterality Date  . AMPUTATION Right 12/22/2013   Procedure: AMPUTATION BELOW KNEE;  Surgeon: [name], MD;  Location: [facility];  Service: Orthopedics;  Laterality: Right;  . AMPUTATION Right 02/19/2014   Procedure: RIGHT ABOVE KNEE AMPUTATION ;  Surgeon: [name], MD;  Location: [facility];  Service: Orthopedics;  Laterality: Right;  . CARDIAC CATHETERIZATION  02/18/2013   normal coronaries  . ESOPHAGOGASTRODUODENOSCOPY N/A 11/02/2016   Procedure: ESOPHAGOGASTRODUODENOSCOPY (EGD);  Surgeon: [name], MD;  Location: [facility] ENDOSCOPY;  Service: Endoscopy;  Laterality: N/A;  . I&D EXTREMITY Right 12/14/2013   Procedure: IRRIGATION AND DEBRIDEMENT RIGHT FOOT;  Surgeon: [name], MD;  Location: [facility];  Service: Orthopedics;  Laterality: Right;  . INCISION AND DRAINAGE ABSCESS  2007; 2015   "back"  . INCISION AND DRAINAGE OF WOUND Right 12/22/2013   Procedure: I&D RIGHT BUTTOCK;  Surgeon: [name], MD;  Location: [facility];  Service: Orthopedics;  Laterality: Right;  . LEFT HEART CATHETERIZATION WITH CORONARY ANGIOGRAM N/A 02/18/2013   Procedure: LEFT HEART CATHETERIZATION WITH CORONARY ANGIOGRAM;  Surgeon: [name], MD;  Location: [facility] CATH LAB;  Service: Cardiovascular;  Laterality: N/A;    There were no vitals filed for this visit.  Subjective Assessment - 01/04/17 0852    Subjective  He is wearing 4-5 hrs 1-3 times per day. Patient reports his apartment is small so hard to get around the apt.     Patient is accompained by:  Family member    Pertinent History  Right TFA, Type 1 DM, HTN,  chest pain, DVT/PE, nephrotic syndrome, morbid obesity, LBP    Patient Stated Goals  To walk with prosthesis in community and stand to perform ADLs at home.     Currently in Pain?  No/denies                      [facility] Adult PT Treatment/Exercise - 01/04/17 0845      Transfers   Transfers  Sit to Stand    Sit to Stand  5: Supervision;With upper extremity assist;From chair/3-in-1;Multiple attempts to RW    Stand to Sit  5: Supervision;With upper extremity assist;To chair/3-in-1 from RW      Ambulation/Gait   Ambulation/Gait  Yes    Ambulation/Gait Assistance  5: Supervision     Ambulation Distance (Feet)  40 Feet 15', 10', 30', 40'    Assistive device  Rolling walker;Prosthesis    Gait Pattern  Step-through pattern;Decreased stride length;Decreased stance time - right;Trunk flexed;Wide base of support;Poor foot clearance - right    Ambulation Surface  Indoor;Level    Ramp  --    Curb  --      Therapeutic Activites    Therapeutic Activities  ADL's    ADL's  PT demo, instructed in toileting with TFA prosthesis. Standing to urinate with placement of RW over the toilet. Sitting for BM with LLE facing forward & only abducting prosthesis. Pt reports concerns with location of a wall on right side of his toilet. Pt return demo both standing for urination & sitting for BM. He reports will attempt at home.       Prosthetics   Prosthetic Care Comments   wearing prosthesis 5hrs 2x/day. Increasing activity level with walking in home to change locations vs w/c. Progress to enter/exit apt via walking and following w/c to enter /exit public buildings to tolerance.  Pt to call prosthetist to discuss socket revision    Current prosthetic wear tolerance (days/week)   daily    Current prosthetic wear tolerance (#hours/day)   4-5hrs 1-3x/day    Residual limb condition   No open areas. Incision healed with no adherances. Normal color, temperature and moisture level. Minimal hair growth. No adductor roll.     Education Provided  Skin check;Proper Donning;Proper wear schedule/adjustment;Other (comment) see prosthetic care comments    Person(s) Educated  Patient    Education Method  Explanation;Verbal cues    Education Method  Verbalized understanding;Verbal cues required;Needs further instruction    Donning Prosthesis  Supervision               PT Short Term Goals - 12/28/16 2128      PT SHORT TERM GOAL #1   Title  Patient verbalizes working with prosthetist on socket revision. (All STGs Target Date: 01/20/2016)    Time  3    Period  Weeks    Status  New    Target Date   01/19/17      PT SHORT TERM GOAL #2   Title  Patient tolerates prosthesis wear daily >12 hrs total /day with limb pain or skin issues.     Time  3    Period  Weeks    Status  New    Target Date  01/19/17      PT SHORT TERM GOAL #3   Title  Patient stands with single crutch support & reaches 10" safely with supervision.     Time  3    Period  Weeks    Status  New    Target Date  01/19/17      PT SHORT TERM GOAL #4   Title  Patient ambulates 56' with crutches & prosthesis with minA.     Time  3    Period  Weeks    Status  New    Target Date  01/19/17        PT Long Term Goals - 12/07/16 1237      PT LONG TERM GOAL #1   Title  Patient verbalizes & demonstrates proper prosthetic care to enable safe use of prosthesis. (Target Date: 03/16/2017)    Baseline  Patient is dependent in proper donning, wear & weight bearing adjustment of prosthesis.     Time  4    Period  Months    Status  On-going    Target Date  03/16/17      PT LONG TERM GOAL #2   Title  Patient tolerates daily wear of prosthesis >75% of awake hours without skin issues or limb pain to enable function throughout his day.    Baseline  Patient tolerates wear of prosthesis ~6 hrs /day which is ~1/3 of awake hours.     Time  4    Period  Months    Status  On-going    Target Date  03/16/17      PT LONG TERM GOAL #3   Title  Patient performs standing balance activities with rolling walker support & prosthesis reaching 10" anteriorly & to floor, manage clothes modified independent.      Baseline  Patient requires Excessive upper extremity support on RW. He reaches 2" only with release of RW <3 seconds to retrieve items and turn head only to scan environment.     Time  4    Period  Months    Status  On-going    Target Date  03/16/17      PT LONG TERM GOAL #4   Title  Patient ambulates 100' with RW & prosthesis modified independent to enable basic community access.     Baseline  Patient ambulates 40' with RW &  Prosthesis with minimal assist requiring w/c to follow due to rapid decline & safety with gait deviations impacting safety.     Time  4    Period  Months    Status  On-going    Target Date  03/16/17      PT LONG TERM GOAL #5   Title  Patient negotiates ramp, curb with RW & stairs with 2 rails with transfemoral prosthesis modified independent to enable community access.      Baseline  Patient is dependent on level surface gait & unable to negotiate ramps, curbs or stairs.     Time  4    Period  Months    Status  On-going    Target Date  03/16/17            Plan - 01/04/17 1206    Clinical Impression Statement  Patient was able to learn how to use prosthesis for toileting with skilled instruction which should increase ability to wear prosthesis for improved function.     Rehab Potential  Good    Clinical Impairments Affecting Rehab Potential  Morbid obesity, length of time with immobility / w/c bound, lives alone.     PT Frequency  2x / week    PT Duration  Other (comment) 16 weeks    PT Treatment/Interventions  ADLs/Self Care Home Management;Gait training;DME Instruction;Stair training;Functional mobility training;Therapeutic  activities;Therapeutic exercise;Balance training;Neuromuscular re-education;Patient/family education;Prosthetic Training;Manual techniques    PT Next Visit Plan  prosthetic gait with RW, initiate stairs, ramps & curbs, try axillary crutches    Consulted and Agree with Plan of Care  Patient       Patient will benefit from skilled therapeutic intervention in order to improve the following deficits and impairments:  Abnormal gait, Decreased activity tolerance, Decreased balance, Decreased endurance, Decreased knowledge of use of DME, Decreased mobility, Decreased range of motion, Decreased strength, Postural dysfunction, Prosthetic Dependency, Obesity, Pain  Visit Diagnosis: Other abnormalities of gait and mobility  Muscle weakness (generalized)  Unsteadiness  on feet  Abnormal posture     Problem List Patient Active Problem List   Diagnosis Date Noted  . History of nausea and vomiting 10/31/2016  . Prosthesis adjustment 08/17/2016  . History of pulmonary embolus (PE) 06/09/2016  . Normocytic anemia 01/30/2014  . Above knee amputation of right lower extremity ([place]) 12/26/2013  . GERD (gastroesophageal reflux disease) 08/14/2013  . Hyperlipidemia 02/19/2013  . S/P cardiac cath, 02/18/13, normal coronaries 02/19/2013  . CKD (chronic kidney disease) stage 3, GFR 30-59 ml/min (HCC) 02/19/2013  . Nephrotic syndrome 02/12/2012  . Essential hypertension 07/24/2011  . Long term current use of anticoagulant therapy 07/19/2011  . Back pain 07/12/2011  . History of DVT (deep vein thrombosis) 07/28/2009  . PROTEINURIA, MILD 02/01/2007  . Morbid obesity ([place]) 10/26/2005  . DM (diabetes mellitus) type I uncontrolled, periph vascular disorder ([place]) 01/09/2002    [name] PT, DPT 01/05/2017, 11:10 AM  [facility] [address] [place], [place], [address] Phone: [phone]   Fax:  [phone]  Name: [name] MRN: [id number] Date of Birth: [birth year]

## 2017-01-08 ENCOUNTER — Ambulatory Visit: Payer: Medicare Other | Admitting: Physical Therapy

## 2017-01-08 DIAGNOSIS — M25651 Stiffness of right hip, not elsewhere classified: Secondary | ICD-10-CM | POA: Diagnosis not present

## 2017-01-08 DIAGNOSIS — R2681 Unsteadiness on feet: Secondary | ICD-10-CM

## 2017-01-08 DIAGNOSIS — M6281 Muscle weakness (generalized): Secondary | ICD-10-CM | POA: Diagnosis not present

## 2017-01-08 DIAGNOSIS — R2689 Other abnormalities of gait and mobility: Secondary | ICD-10-CM

## 2017-01-08 DIAGNOSIS — R293 Abnormal posture: Secondary | ICD-10-CM | POA: Diagnosis not present

## 2017-01-08 NOTE — Patient Instructions (Addendum)
Posture - Sitting    Sit upright, head facing forward. Try using a roll to support lower back. Keep shoulders relaxed, and avoid rounded back. Keep hips level with knees. Avoid crossing legs for long periods.   Copyright  VHI. All rights reserved.  Posture - Standing    Good posture is important. Avoid slouching and forward head thrust. Maintain curve in low back and align ears over shoul- ders, hips over ankles.   Copyright  VHI. All rights reserved.  Posture Awareness    Stand and check posture: Jut chin, pull back to comfortable position. Tilt pelvis forward, back; be sure back is not swayed. Roll from heels to balls of feet, then distribute your weight evenly. Picture a line through spine pulling you erect. Focus on breathing. Good Posture = Better Breathing. Check ____ times per day.  http://gt2.exer.us/873   Copyright  VHI. All rights reserved.  Posture Tips    -All exercises are to be done with proper posture (PP). -Sit as erect as possible. Sit on ishuim bones not tailbone. -Frontal View: Chin over Sternum, Sternum over Navel. -Side View: Ear over Shoulder, Shoulder over Hip.   Copyright  VHI. All rights reserved.    Sit to Stand / Stand to Sit / Transfers    Sit on edge of a solid chair with arms, feet flat on floor. Lean forward over feet and stand up with hands on chair arms. Sit down slowly with hands on chair arms. Goals are 1)stand on first attempt  2)then try to stand without touching walker or stabilizing object Repeat _5-10___ times per session. Do __1-3__ sessions per day.  Copyright  VHI. All rights reserved.      Feet Apart, Varied Arm Positions - Eyes Open    With eyes open, feet shoulder width apart, arms at side, look straight ahead at a stationary object. Try to not touch walker or stabilizing object.  Hold up to  __60__ seconds. Repeat __3-5__ times per session. Do __1-4__ sessions per day.  Copyright  VHI. All rights reserved.    Feet Apart, Head Motion - Eyes Open    With eyes open, feet apart, try to let go of walker & move head slowly: right/left, then up/down, diagonal up-right/down-left & then up-left/down-right. Repeat __1-3__ times each direction per session. Do _1-4__ sessions per day.  Copyright  VHI. All rights reserved.

## 2017-01-09 NOTE — Therapy (Signed)
[facility] [address] [place], [place], [address] Phone: [phone]   Fax:  [phone]  Physical Therapy Treatment  Patient Details  Name: [name] MRN: [id number] Date of Birth: March 23, 1975 Referring Provider: Kalman Shan, DO   Encounter Date: 01/08/2017  PT End of Session - 01/08/17 1441    Visit Number  9    Number of Visits  33    Date for PT Re-Evaluation  01/19/17    Authorization Type  Medicare G-Code & progress note every 10th visit & Medicaid    PT Start Time  0845    PT Stop Time  0934    PT Time Calculation (min)  49 min    Equipment Utilized During Treatment  Gait belt    Activity Tolerance  Patient tolerated treatment well    Behavior During Therapy  [facility] for tasks assessed/performed       Past Medical History:  Diagnosis Date  . Acute venous embolism and thrombosis of deep vessels of proximal lower extremity ([place]) 07/19/2011  . Anemia   . Chest pain, neg MI, normal coronaries by cath 02/18/2013  . CKD (chronic kidney disease) stage 3, GFR 30-59 ml/min (HCC) 02/19/2013  . Colon polyps   . Diabetic ulcer of right foot ([place])   . DVT (deep venous thrombosis) ([place]) 09/2002   patient reports additional DVTs in '06 & '11 (unconfirmed)  . GERD (gastroesophageal reflux disease)   . History of blood transfusion    "related to OR" (10/31/2016)  . Hyperlipidemia 02/19/2013  . Hypertension   . Nausea & vomiting    "constant for the last couple weeks" (10/31/2016)  . Nephrotic syndrome   . Obesity    BMI 44, weight 346 pounds 01/30/14  . Pulmonary embolism ([place]) 09/2002   treated with 6 months of warfarin  . Type I diabetes mellitus ([place]) dx'd 2001    Past Surgical History:  Procedure Laterality Date  . AMPUTATION Right 12/22/2013   Procedure: AMPUTATION BELOW KNEE;  Surgeon: [name], MD;  Location: [facility];  Service: Orthopedics;  Laterality: Right;  . AMPUTATION Right 02/19/2014   Procedure: RIGHT ABOVE KNEE AMPUTATION ;  Surgeon: [name], MD;  Location: [facility];  Service: Orthopedics;  Laterality: Right;  . CARDIAC CATHETERIZATION  02/18/2013   normal coronaries  . ESOPHAGOGASTRODUODENOSCOPY N/A 11/02/2016   Procedure: ESOPHAGOGASTRODUODENOSCOPY (EGD);  Surgeon: [name], MD;  Location: [facility] ENDOSCOPY;  Service: Endoscopy;  Laterality: N/A;  . I&D EXTREMITY Right 12/14/2013   Procedure: IRRIGATION AND DEBRIDEMENT RIGHT FOOT;  Surgeon: [name], MD;  Location: [facility];  Service: Orthopedics;  Laterality: Right;  . INCISION AND DRAINAGE ABSCESS  2007; 2015   "back"  . INCISION AND DRAINAGE OF WOUND Right 12/22/2013   Procedure: I&D RIGHT BUTTOCK;  Surgeon: [name], MD;  Location: [facility];  Service: Orthopedics;  Laterality: Right;  . LEFT HEART CATHETERIZATION WITH CORONARY ANGIOGRAM N/A 02/18/2013   Procedure: LEFT HEART CATHETERIZATION WITH CORONARY ANGIOGRAM;  Surgeon: [name], MD;  Location: [facility] CATH LAB;  Service: Cardiovascular;  Laterality: N/A;    There were no vitals filed for this visit.  Subjective Assessment - 01/08/17 0845    Subjective  He tried standing to urinate & it worked well. Sitting on his toilet as PT recommended for BM is not possible with wall on right side of toilet.     Patient is accompained by:  Family member    Pertinent History  Right TFA, Type 1 DM, HTN, chest pain, DVT/PE, nephrotic syndrome, morbid obesity, LBP    Patient Stated Goals  To walk with prosthesis in community and stand to perform ADLs at home.     Currently in Pain?  No/denies                      [facility] Adult PT Treatment/Exercise - 01/08/17 0845      Transfers   Transfers  Sit to Stand;Stand to Sit    Sit to Stand  5: Supervision;With upper extremity assist;From chair/3-in-1    Sit to Stand Details  Visual cues/gestures for sequencing;Verbal cues for technique    Sit to Stand Details (indicate cue type and reason)  worked on standing  with RUE support on crutch handle. He required multiple attempts & PT stabilizing crutch    Stand to Sit  5: Supervision;With upper extremity assist;To chair/3-in-1;Uncontrolled descent    Comments  working on not needing to touch to stabilize upon arising.      Ambulation/Gait   Ambulation/Gait  Yes    Ambulation/Gait Assistance  5: Supervision    Ambulation/Gait Assistance Details  verbal cues on posture    Ambulation Distance (Feet)  50 Feet 50' & 40'    Assistive device  Rolling walker;Prosthesis    Ambulation Surface  Indoor;Level      Posture/Postural Control   Posture Comments  tactile & verbal cues on upright posture with proper head & scapula/shoulder position in sitting & standing.       Therapeutic Activites    Therapeutic Activities  ADL's    ADL's  PT demo, instructed in BM facing backwards on toilet when not wearing prosthesis.  Pt verbalized understanding.       Prosthetics   Prosthetic Care Comments   Pt to contact prosthetist regarding an appt to discuss socket revision    Current prosthetic wear tolerance (days/week)   daily    Current prosthetic wear tolerance (#hours/day)   4-5hrs 1-3x/day    Education Provided  Proper weight-bearing schedule/adjustment;Proper wear schedule/adjustment    Person(s) Educated  Patient    Education Method  Explanation;Verbal cues    Education Method  Verbalized understanding;Needs further instruction             PT Education - 01/08/17 0920    Education provided  Yes    Education Details  proper posture, standing without UE support, head turns without UE support near RW for intermittent support as needed (~5 sec),     Person(s) Educated  Patient    Methods  Explanation;Demonstration;Tactile cues;Verbal cues;Handout    Comprehension  Verbalized understanding;Returned demonstration;Verbal cues required;Tactile cues required;Need further instruction       PT Short Term Goals - 12/28/16 2128      PT SHORT TERM GOAL #1    Title  Patient verbalizes working with prosthetist on socket revision. (All STGs Target Date: 01/20/2016)    Time  3    Period  Weeks    Status  New    Target Date  01/19/17      PT SHORT TERM GOAL #2   Title  Patient tolerates prosthesis wear daily >12 hrs total /day with limb pain or skin issues.     Time  3    Period  Weeks    Status  New    Target Date  01/19/17      PT SHORT TERM GOAL #3   Title  Patient stands with single  crutch support & reaches 10" safely with supervision.     Time  3    Period  Weeks    Status  New    Target Date  01/19/17      PT SHORT TERM GOAL #4   Title  Patient ambulates 67' with crutches & prosthesis with minA.     Time  3    Period  Weeks    Status  New    Target Date  01/19/17        PT Long Term Goals - 12/07/16 1237      PT LONG TERM GOAL #1   Title  Patient verbalizes & demonstrates proper prosthetic care to enable safe use of prosthesis. (Target Date: 03/16/2017)    Baseline  Patient is dependent in proper donning, wear & weight bearing adjustment of prosthesis.     Time  4    Period  Months    Status  On-going    Target Date  03/16/17      PT LONG TERM GOAL #2   Title  Patient tolerates daily wear of prosthesis >75% of awake hours without skin issues or limb pain to enable function throughout his day.    Baseline  Patient tolerates wear of prosthesis ~6 hrs /day which is ~1/3 of awake hours.     Time  4    Period  Months    Status  On-going    Target Date  03/16/17      PT LONG TERM GOAL #3   Title  Patient performs standing balance activities with rolling walker support & prosthesis reaching 10" anteriorly & to floor, manage clothes modified independent.      Baseline  Patient requires Excessive upper extremity support on RW. He reaches 2" only with release of RW <3 seconds to retrieve items and turn head only to scan environment.     Time  4    Period  Months    Status  On-going    Target Date  03/16/17      PT LONG TERM  GOAL #4   Title  Patient ambulates 100' with RW & prosthesis modified independent to enable basic community access.     Baseline  Patient ambulates 8' with RW & Prosthesis with minimal assist requiring w/c to follow due to rapid decline & safety with gait deviations impacting safety.     Time  4    Period  Months    Status  On-going    Target Date  03/16/17      PT LONG TERM GOAL #5   Title  Patient negotiates ramp, curb with RW & stairs with 2 rails with transfemoral prosthesis modified independent to enable community access.      Baseline  Patient is dependent on level surface gait & unable to negotiate ramps, curbs or stairs.     Time  4    Period  Months    Status  On-going    Target Date  03/16/17            Plan - 01/08/17 1442    Clinical Impression Statement  Patient seems to understand standing balance activities near support with goal to decrease need for support.     Rehab Potential  Good    Clinical Impairments Affecting Rehab Potential  Morbid obesity, length of time with immobility / w/c bound, lives alone.     PT Frequency  2x / week    PT Duration  Other (  comment) 16 weeks    PT Treatment/Interventions  ADLs/Self Care Home Management;Gait training;DME Instruction;Stair training;Functional mobility training;Therapeutic activities;Therapeutic exercise;Balance training;Neuromuscular re-education;Patient/family education;Prosthetic Training;Manual techniques    PT Next Visit Plan  Do G-code & progress note based on prosthetic care & wear, prosthetic gait with RW, stairs(2 rails), ramps & curbs, try axillary crutches    Consulted and Agree with Plan of Care  Patient       Patient will benefit from skilled therapeutic intervention in order to improve the following deficits and impairments:  Abnormal gait, Decreased activity tolerance, Decreased balance, Decreased endurance, Decreased knowledge of use of DME, Decreased mobility, Decreased range of motion, Decreased  strength, Postural dysfunction, Prosthetic Dependency, Obesity, Pain  Visit Diagnosis: Other abnormalities of gait and mobility  Muscle weakness (generalized)  Unsteadiness on feet  Abnormal posture     Problem List Patient Active Problem List   Diagnosis Date Noted  . History of nausea and vomiting 10/31/2016  . Prosthesis adjustment 08/17/2016  . History of pulmonary embolus (PE) 06/09/2016  . Normocytic anemia 01/30/2014  . Above knee amputation of right lower extremity ([place]) 12/26/2013  . GERD (gastroesophageal reflux disease) 08/14/2013  . Hyperlipidemia 02/19/2013  . S/P cardiac cath, 02/18/13, normal coronaries 02/19/2013  . CKD (chronic kidney disease) stage 3, GFR 30-59 ml/min (HCC) 02/19/2013  . Nephrotic syndrome 02/12/2012  . Essential hypertension 07/24/2011  . Long term current use of anticoagulant therapy 07/19/2011  . Back pain 07/12/2011  . History of DVT (deep vein thrombosis) 07/28/2009  . PROTEINURIA, MILD 02/01/2007  . Morbid obesity ([place]) 10/26/2005  . DM (diabetes mellitus) type I uncontrolled, periph vascular disorder ([place]) 01/09/2002    Kiet Geer PT, DPT 01/09/2017, 2:45 PM  [facility] [address] [place], [place], [address] Phone: [phone]   Fax:  [phone]  Name: [name] MRN: [id number] Date of Birth: [birth year]

## 2017-01-11 ENCOUNTER — Ambulatory Visit: Payer: Medicare Other | Attending: Internal Medicine | Admitting: Physical Therapy

## 2017-01-11 DIAGNOSIS — R2681 Unsteadiness on feet: Secondary | ICD-10-CM | POA: Insufficient documentation

## 2017-01-11 DIAGNOSIS — E669 Obesity, unspecified: Secondary | ICD-10-CM | POA: Insufficient documentation

## 2017-01-11 DIAGNOSIS — Z9181 History of falling: Secondary | ICD-10-CM | POA: Insufficient documentation

## 2017-01-11 DIAGNOSIS — M25651 Stiffness of right hip, not elsewhere classified: Secondary | ICD-10-CM | POA: Insufficient documentation

## 2017-01-11 DIAGNOSIS — M6281 Muscle weakness (generalized): Secondary | ICD-10-CM | POA: Insufficient documentation

## 2017-01-11 DIAGNOSIS — R2689 Other abnormalities of gait and mobility: Secondary | ICD-10-CM | POA: Insufficient documentation

## 2017-01-11 DIAGNOSIS — R293 Abnormal posture: Secondary | ICD-10-CM | POA: Insufficient documentation

## 2017-01-16 ENCOUNTER — Ambulatory Visit: Payer: Medicare Other | Admitting: Physical Therapy

## 2017-01-18 ENCOUNTER — Encounter: Payer: Self-pay | Admitting: Physical Therapy

## 2017-01-18 ENCOUNTER — Ambulatory Visit: Payer: Medicare Other | Admitting: Physical Therapy

## 2017-01-18 DIAGNOSIS — R2689 Other abnormalities of gait and mobility: Secondary | ICD-10-CM

## 2017-01-18 DIAGNOSIS — M25651 Stiffness of right hip, not elsewhere classified: Secondary | ICD-10-CM | POA: Diagnosis not present

## 2017-01-18 DIAGNOSIS — R2681 Unsteadiness on feet: Secondary | ICD-10-CM | POA: Diagnosis not present

## 2017-01-18 DIAGNOSIS — M6281 Muscle weakness (generalized): Secondary | ICD-10-CM

## 2017-01-18 DIAGNOSIS — E669 Obesity, unspecified: Secondary | ICD-10-CM | POA: Diagnosis not present

## 2017-01-18 DIAGNOSIS — R293 Abnormal posture: Secondary | ICD-10-CM | POA: Diagnosis not present

## 2017-01-18 DIAGNOSIS — Z9181 History of falling: Secondary | ICD-10-CM

## 2017-01-18 NOTE — Therapy (Signed)
[facility] [address] [place], [place], [address] Phone: [phone]   Fax:  [phone]  Physical Therapy Treatment  Patient Details  Name: [name] MRN: [id number] Date of Birth: December 25, 1975 Referring Provider: Kalman Shan, DO   Encounter Date: 01/18/2017  PT End of Session - 01/18/17 2107    Visit Number  10    Number of Visits  26    Date for PT Re-Evaluation  03/19/17    Authorization Type  Medicare Recertification every 60 days    PT Start Time  0845    PT Stop Time  0930    PT Time Calculation (min)  45 min    Equipment Utilized During Treatment  Gait belt    Activity Tolerance  Patient tolerated treatment well    Behavior During Therapy  [facility] for tasks assessed/performed       Past Medical History:  Diagnosis Date  . Acute venous embolism and thrombosis of deep vessels of proximal lower extremity ([place]) 07/19/2011  . Anemia   . Chest pain, neg MI, normal coronaries by cath 02/18/2013  . CKD (chronic kidney disease) stage 3, GFR 30-59 ml/min (HCC) 02/19/2013  . Colon polyps   . Diabetic ulcer of right foot ([place])   . DVT (deep venous thrombosis) ([place]) 09/2002   patient reports additional DVTs in '06 & '11 (unconfirmed)  . GERD (gastroesophageal reflux disease)   . History of blood transfusion    "related to OR" (10/31/2016)  . Hyperlipidemia 02/19/2013  . Hypertension   . Nausea & vomiting    "constant for the last couple weeks" (10/31/2016)  . Nephrotic syndrome   . Obesity    BMI 44, weight 346 pounds 01/30/14  . Pulmonary embolism ([place]) 09/2002   treated with 6 months of warfarin  . Type I diabetes mellitus ([place]) dx'd 2001    Past Surgical History:  Procedure Laterality Date  . AMPUTATION Right 12/22/2013   Procedure: AMPUTATION BELOW KNEE;  Surgeon: [name], MD;  Location: [facility];  Service: Orthopedics;  Laterality: Right;  . AMPUTATION Right 02/19/2014   Procedure: RIGHT ABOVE KNEE  AMPUTATION ;  Surgeon: [name], MD;  Location: [facility];  Service: Orthopedics;  Laterality: Right;  . CARDIAC CATHETERIZATION  02/18/2013   normal coronaries  . ESOPHAGOGASTRODUODENOSCOPY N/A 11/02/2016   Procedure: ESOPHAGOGASTRODUODENOSCOPY (EGD);  Surgeon: [name], MD;  Location: [facility] ENDOSCOPY;  Service: Endoscopy;  Laterality: N/A;  . I&D EXTREMITY Right 12/14/2013   Procedure: IRRIGATION AND DEBRIDEMENT RIGHT FOOT;  Surgeon: [name], MD;  Location: [facility];  Service: Orthopedics;  Laterality: Right;  . INCISION AND DRAINAGE ABSCESS  2007; 2015   "back"  . INCISION AND DRAINAGE OF WOUND Right 12/22/2013   Procedure: I&D RIGHT BUTTOCK;  Surgeon: [name], MD;  Location: [facility];  Service: Orthopedics;  Laterality: Right;  . LEFT HEART CATHETERIZATION WITH CORONARY ANGIOGRAM N/A 02/18/2013   Procedure: LEFT HEART CATHETERIZATION WITH CORONARY ANGIOGRAM;  Surgeon: [name], MD;  Location: [facility] CATH LAB;  Service: Cardiovascular;  Laterality: N/A;    There were no vitals filed for this visit.  Subjective Assessment - 01/18/17 0849    Subjective  Wearing prosthesis 6hrs total /day. Using RW in apt but had to move things. He is requesting a note from MD for higher toilet so apt will do it under ADA.    Pertinent History  Right TFA, Type 1 DM, HTN, chest pain, DVT/PE, nephrotic syndrome, morbid  obesity, LBP    Currently in Pain?  No/denies            Exercises for gym near dip bar for support: Reaching towards the floor (PT demo technique) To knee level prosthesis can stay under you. If lower then kick prosthesis out to side but keep it locked and bend left leg to reach to floor (goal). Eventually can add weights. For safety have a box behind you that you can sit on if you loose your balance.    Weight:  W/c + prosthesis + [place] = 442# W/c + [place] = 425# W/c = 49#  So prosthesis / liner = 17# Day[place]'s weight = 376#          [facility] Adult PT  Treatment/Exercise - 01/18/17 0845      Transfers   Transfers  Sit to Stand;Stand to Sit    Sit to Stand  5: Supervision;With upper extremity assist;From chair/3-in-1;Multiple attempts to RW    Sit to Stand Details  Visual cues/gestures for sequencing;Verbal cues for technique    Stand to Sit  5: Supervision;With upper extremity assist;To chair/3-in-1;Uncontrolled descent to RW    Comments  --      Ambulation/Gait   Ambulation/Gait  Yes    Ambulation/Gait Assistance  3: Mod assist 3 people for safety (1 anterior, 1 prosthetic side, 1 w/c)    Ambulation/Gait Assistance Details  PT demo, verbal cues on crutch placement, prosthetic step & wt shift.  Initially attempted 4-pt pattern but unable to perform. Switched to 3 pt pattern but moving each crutch seperately.     Ambulation Distance (Feet)  8 Feet 3', 4', 8'    Assistive device  Prosthesis;Crutches;Rolling walker    Ambulation Surface  Indoor;Level      Posture/Postural Control   Posture Comments  --      Therapeutic Activites    Therapeutic Activities  --    ADL's  --      Prosthetics   Prosthetic Care Comments   Pt has called prosthetist regarding an appt to discuss socket revision but has not heard back yet.     Current prosthetic wear tolerance (days/week)   daily    Current prosthetic wear tolerance (#hours/day)   6 hrs total /day    Residual limb condition   No open areas. Incision healed with no adherances. Normal color, temperature and moisture level. Minimal hair growth. No adductor roll.     Education Provided  Proper weight-bearing schedule/adjustment;Proper wear schedule/adjustment;Skin check    Person(s) Educated  Patient    Education Method  Explanation;Verbal cues    Education Method  Verbalized understanding;Verbal cues required;Needs further instruction               PT Short Term Goals - 01/18/17 2109      PT SHORT TERM GOAL #1   Title  Patient verbalizes working with prosthetist on socket revision.  (All STGs Target Date: 01/20/2016)    Baseline  Partially MET 01/19/2016  Patient has called prosthetic company to set-up appointment but has not heard back due to holidays.     Time  3    Period  Weeks    Status  Partially Met      PT SHORT TERM GOAL #2   Title  Patient tolerates prosthesis wear daily >12 hrs total /day with limb pain or skin issues.     Baseline  NOT MET 01/18/2017 Patient reports wearing up to 6hrs total/day.     Time  3    Period  Weeks    Status  Not Met      PT SHORT TERM GOAL #3   Title  Patient stands with single crutch support & reaches 10" safely with supervision.     Baseline  NOT MET 01/18/2017 Patient has missed multiple appointments with weather & holidays. Pt able to reach 10" with RW support.     Time  3    Period  Weeks    Status  Not Met      PT SHORT TERM GOAL #4   Title  Patient ambulates 31' with crutches & prosthesis with minA.     Baseline  Partially MET with RW at 89' but only introduced crtuches on 1/10. He ambulated up to 8' with modA (3 people for safety with his size)    Time  3    Period  Weeks    Status  Not Met         01/18/17 2125  PT SHORT TERM GOAL #1  Title Patient verbalizes working with prosthetist on socket revision. (All STGs Target Date: 02/16/2017)  Time 4  Period Weeks  Status On-going  Target Date 02/16/17  PT SHORT TERM GOAL #2  Title Patient tolerates prosthesis wear daily >8 hrs total /day with limb pain or skin issues.   Time 4  Period Weeks  Status On-going  Target Date 02/16/17  PT SHORT TERM GOAL #3  Title Patient reaches towards floor within 10" with RW support with supervision.   Time 4  Period Weeks  Status New  Target Date 02/16/17  PT SHORT TERM GOAL #4  Title Patient ambulates 31' with RW & prosthesis with supervision.  Time 4  Period Weeks  Status On-going  Target Date 02/16/17  PT SHORT TERM GOAL #5  Title Patient negotiates ramps & curbs with RW & prosthesis with supervision.   Time 4   Period Weeks  Status New  Target Date 02/16/17    PT Long Term Goals - 01/18/17 2113      PT LONG TERM GOAL #1   Title  Patient verbalizes & demonstrates proper prosthetic care to enable safe use of prosthesis. (Target Date: 03/16/2017)    Time  2    Period  Months    Status  On-going    Target Date  03/16/17      PT LONG TERM GOAL #2   Title  Patient tolerates daily wear of prosthesis >60% of awake hours without skin issues or limb pain to enable function throughout his day.    Time  2    Period  Months    Status  On-going    Target Date  03/16/17      PT LONG TERM GOAL #3   Title  Patient performs standing balance activities with rolling walker support & prosthesis reaching 10" anteriorly & to floor, manage clothes modified independent.      Time  2    Period  Months    Status  On-going    Target Date  03/16/17      PT LONG TERM GOAL #4   Title  Patient ambulates 100' with RW & prosthesis modified independent to enable basic community access.     Time  2    Period  Months    Status  On-going    Target Date  03/16/17      PT LONG TERM GOAL #5   Title  Patient negotiates ramp, curb with RW &  stairs with 2 rails with transfemoral prosthesis modified independent to enable community access.      Time  2    Period  Months    Status  On-going    Target Date  03/16/17            Plan - 01/18/17 2115    Clinical Impression Statement  Patient had limited progress towards STGs set for 2nd 30 day period due to missed appointments with weather (snow) & holidays. He is progressing with functional use of prosthesis including wearing & using in his apartment. Patient's residual limb has shrunk to point that appears to need a socket revision and he is trying to get appointment with prosthetist. Also he has difficulty standing from lower surfaces so needs a higher handicap toilet in his apt but needs a note from MD. Patient would benefit from additional PT to progress function with  his prosthesis.     Rehab Potential  Good    Clinical Impairments Affecting Rehab Potential  Morbid obesity, length of time with immobility / w/c bound, lives alone.     PT Frequency  2x / week    PT Duration  8 weeks 16 weeks    PT Treatment/Interventions  ADLs/Self Care Home Management;Gait training;DME Instruction;Stair training;Functional mobility training;Therapeutic activities;Therapeutic exercise;Balance training;Neuromuscular re-education;Patient/family education;Prosthetic Training;Manual techniques    PT Next Visit Plan  prosthetic gait with RW, stairs(2 rails), ramps & curbs    Consulted and Agree with Plan of Care  Patient       Patient will benefit from skilled therapeutic intervention in order to improve the following deficits and impairments:  Abnormal gait, Decreased activity tolerance, Decreased balance, Decreased endurance, Decreased knowledge of use of DME, Decreased mobility, Decreased range of motion, Decreased strength, Postural dysfunction, Prosthetic Dependency, Obesity, Pain  Visit Diagnosis: Other abnormalities of gait and mobility  Muscle weakness (generalized)  Unsteadiness on feet  Abnormal posture  Stiffness of right hip, not elsewhere classified  History of falling  Obesity with body mass index greater than 30     Problem List Patient Active Problem List   Diagnosis Date Noted  . History of nausea and vomiting 10/31/2016  . Prosthesis adjustment 08/17/2016  . History of pulmonary embolus (PE) 06/09/2016  . Normocytic anemia 01/30/2014  . Above knee amputation of right lower extremity ([place]) 12/26/2013  . GERD (gastroesophageal reflux disease) 08/14/2013  . Hyperlipidemia 02/19/2013  . S/P cardiac cath, 02/18/13, normal coronaries 02/19/2013  . CKD (chronic kidney disease) stage 3, GFR 30-59 ml/min (HCC) 02/19/2013  . Nephrotic syndrome 02/12/2012  . Essential hypertension 07/24/2011  . Long term current use of anticoagulant therapy 07/19/2011   . Back pain 07/12/2011  . History of DVT (deep vein thrombosis) 07/28/2009  . PROTEINURIA, MILD 02/01/2007  . Morbid obesity ([place]) 10/26/2005  . DM (diabetes mellitus) type I uncontrolled, periph vascular disorder ([place]) 01/09/2002    Yoltzin Ransom PT, DPT 01/18/2017, 9:22 PM  [facility] [address] [place], [place], [address] Phone: [phone]   Fax:  [phone]  Name: Fareed Fung MRN: [id number] Date of Birth: [birth year]

## 2017-01-18 NOTE — Patient Instructions (Addendum)
Reaching towards the floor: To knee level prosthesis can stay under you. If lower then kick prosthesis out to side but keep it locked and bend left leg to reach to floor (goal). Eventually can add weights. For safety have a box behind you that you can sit on if you loose your balance.    Weight:  W/c + prosthesis + [place] = 442# W/c + [place] = 425# W/c = 49#  So prosthesis / liner = 17# [place] = 376#

## 2017-01-23 ENCOUNTER — Encounter: Payer: Self-pay | Admitting: Physical Therapy

## 2017-01-23 ENCOUNTER — Ambulatory Visit: Payer: Medicare Other | Admitting: Physical Therapy

## 2017-01-23 DIAGNOSIS — R2689 Other abnormalities of gait and mobility: Secondary | ICD-10-CM

## 2017-01-23 DIAGNOSIS — R2681 Unsteadiness on feet: Secondary | ICD-10-CM | POA: Diagnosis not present

## 2017-01-23 DIAGNOSIS — R293 Abnormal posture: Secondary | ICD-10-CM | POA: Diagnosis not present

## 2017-01-23 DIAGNOSIS — M25651 Stiffness of right hip, not elsewhere classified: Secondary | ICD-10-CM | POA: Diagnosis not present

## 2017-01-23 DIAGNOSIS — M6281 Muscle weakness (generalized): Secondary | ICD-10-CM | POA: Diagnosis not present

## 2017-01-23 DIAGNOSIS — Z9181 History of falling: Secondary | ICD-10-CM | POA: Diagnosis not present

## 2017-01-23 NOTE — Patient Instructions (Addendum)
1. Reach towards ground without arm support. No weight initially. May start to knee level and progress towards floor.  2. Work on standing up on first attempt.  Then progress to less arm support to stabilize. 3. Stand without arm support:  A.work on how long you can stand without touching   B.Look right & left slowly  C. Look up & down slowly. Your buttocks shifts opposite head slightly.

## 2017-01-23 NOTE — Therapy (Signed)
[name] [address] [place], [place], [address] Phone: [phone]   Fax:  [phone]  Physical Therapy Treatment  Patient Details  Name: [name] MRN: [id number] Date of Birth: Aug 07, 1975 Referring Provider: Kalman Shan, DO   Encounter Date: 01/23/2017  PT End of Session - 01/23/17 1021    Visit Number  11    Number of Visits  26    Date for PT Re-Evaluation  03/19/17    Authorization Type  Medicare Recertification every 60 days    PT Start Time  0845    PT Stop Time  0930    PT Time Calculation (min)  45 min    Equipment Utilized During Treatment  Gait belt    Activity Tolerance  Patient tolerated treatment well    Behavior During Therapy  [facility] for tasks assessed/performed       Past Medical History:  Diagnosis Date  . Acute venous embolism and thrombosis of deep vessels of proximal lower extremity ([place]) 07/19/2011  . Anemia   . Chest pain, neg MI, normal coronaries by cath 02/18/2013  . CKD (chronic kidney disease) stage 3, GFR 30-59 ml/min (HCC) 02/19/2013  . Colon polyps   . Diabetic ulcer of right foot ([place])   . DVT (deep venous thrombosis) ([place]) 09/2002   patient reports additional DVTs in '06 & '11 (unconfirmed)  . GERD (gastroesophageal reflux disease)   . History of blood transfusion    "related to OR" (10/31/2016)  . Hyperlipidemia 02/19/2013  . Hypertension   . Nausea & vomiting    "constant for the last couple weeks" (10/31/2016)  . Nephrotic syndrome   . Obesity    BMI 44, weight 346 pounds 01/30/14  . Pulmonary embolism ([place]) 09/2002   treated with 6 months of warfarin  . Type I diabetes mellitus ([place]) dx'd 2001    Past Surgical History:  Procedure Laterality Date  . AMPUTATION Right 12/22/2013   Procedure: AMPUTATION BELOW KNEE;  Surgeon: [name], MD;  Location: [place] [facility];  Service: Orthopedics;  Laterality: Right;  . AMPUTATION Right 02/19/2014   Procedure: RIGHT ABOVE KNEE  AMPUTATION ;  Surgeon: [name], MD;  Location: [facility];  Service: Orthopedics;  Laterality: Right;  . CARDIAC CATHETERIZATION  02/18/2013   normal coronaries  . ESOPHAGOGASTRODUODENOSCOPY N/A 11/02/2016   Procedure: ESOPHAGOGASTRODUODENOSCOPY (EGD);  Surgeon: [name], MD;  Location: [facility];  Service: Endoscopy;  Laterality: N/A;  . I&D EXTREMITY Right 12/14/2013   Procedure: IRRIGATION AND DEBRIDEMENT RIGHT FOOT;  Surgeon: [name], MD;  Location: [facility];  Service: Orthopedics;  Laterality: Right;  . INCISION AND DRAINAGE ABSCESS  2007; 2015   "back"  . INCISION AND DRAINAGE OF WOUND Right 12/22/2013   Procedure: I&D RIGHT BUTTOCK;  Surgeon: [name], MD;  Location: [facility];  Service: Orthopedics;  Laterality: Right;  . LEFT HEART CATHETERIZATION WITH CORONARY ANGIOGRAM N/A 02/18/2013   Procedure: LEFT HEART CATHETERIZATION WITH CORONARY ANGIOGRAM;  Surgeon: [name], MD;  Location: [facility] CATH LAB;  Service: Cardiovascular;  Laterality: N/A;    There were no vitals filed for this visit.  Subjective Assessment - 01/23/17 0845    Subjective  He is trying to wear & use prosthesis but apt is limited space.     Pertinent History  Right TFA, Type 1 DM, HTN, chest pain, DVT/PE, nephrotic syndrome, morbid obesity, LBP    Patient Stated Goals  To walk with prosthesis in community and stand  to perform ADLs at home.     Currently in Pain?  No/denies                      [facility] Adult PT Treatment/Exercise - 01/23/17 0845      Transfers   Transfers  Sit to Stand;Stand to Sit    Sit to Stand  5: Supervision;With upper extremity assist;From chair/3-in-1;Multiple attempts    Sit to Stand Details  Visual cues/gestures for sequencing;Verbal cues for technique    Stand to Sit  5: Supervision;With upper extremity assist;To chair/3-in-1;Uncontrolled descent    Comments  --      Ambulation/Gait   Ambulation/Gait  Yes    Ambulation/Gait Assistance  5: Supervision     Ambulation/Gait Assistance Details  verbal cues on posture & wt shift    Ambulation Distance (Feet)  45 Feet 45' X 2    Assistive device  Rolling walker;Prosthesis    Ambulation Surface  Indoor;Level      Posture/Postural Control   Posture Comments  tactile & verbal cues on upright posture with proper head & scapula/shoulder position in sitting & standing.       Therapeutic Activites    Therapeutic Activities  --    ADL's  --      Prosthetics   Prosthetic Care Comments   Per pt request PT discussed limb revision surgery and PT recommended waiting until he looses the weight he is attempting to loose prior to consult with surgeon to discuss it.     Current prosthetic wear tolerance (days/week)   daily    Current prosthetic wear tolerance (#hours/day)   4-5hrs 1-3x/day Now wearing at gym during workouts more    Residual limb condition   No open areas. Incision healed with no adherances. Normal color, temperature and moisture level. Minimal hair growth. No adductor roll.     Education Provided  Proper weight-bearing schedule/adjustment;Proper wear schedule/adjustment    Person(s) Educated  Patient    Education Method  Explanation;Verbal cues    Education Method  Verbalized understanding;Needs further instruction          Balance Exercises - 01/23/17 0845      Balance Exercises: Standing   Standing Eyes Opened  Wide ([place]);Head turns;Solid surface;2 reps static no UE support up to 15sec, head turns rt/lt & up/down        PT Education - 01/23/17 0900    Education provided  Yes    Education Details  socket revision, Per pt request, discussion on limb revision surgery and PT recommended to hold until he looses the weight he wants.     Person(s) Educated  Patient    Methods  Explanation;Verbal cues    Comprehension  Verbalized understanding       PT Short Term Goals - 01/23/17 1022      PT SHORT TERM GOAL #1   Title  Patient verbalizes working with prosthetist on socket revision.  (All STGs Target Date: 02/16/2017)    Time  4    Period  Weeks    Status  On-going    Target Date  02/16/17      PT SHORT TERM GOAL #2   Title  Patient tolerates prosthesis wear daily >8 hrs total /day with limb pain or skin issues.     Time  4    Period  Weeks    Status  On-going    Target Date  02/16/17      PT SHORT TERM GOAL #  3   Title  Patient reaches towards floor within 10" with RW support with supervision.     Time  4    Period  Weeks    Status  On-going    Target Date  02/16/17      PT SHORT TERM GOAL #4   Title  Patient ambulates 2' with RW & prosthesis with supervision.    Time  4    Period  Weeks    Status  On-going    Target Date  02/16/17      PT SHORT TERM GOAL #5   Title  Patient negotiates ramps & curbs with RW & prosthesis with supervision.     Time  4    Period  Weeks    Status  On-going    Target Date  02/16/17        PT Long Term Goals - 01/18/17 2113      PT LONG TERM GOAL #1   Title  Patient verbalizes & demonstrates proper prosthetic care to enable safe use of prosthesis. (Target Date: 03/16/2017)    Time  2    Period  Months    Status  On-going    Target Date  03/16/17      PT LONG TERM GOAL #2   Title  Patient tolerates daily wear of prosthesis >60% of awake hours without skin issues or limb pain to enable function throughout his day.    Time  2    Period  Months    Status  On-going    Target Date  03/16/17      PT LONG TERM GOAL #3   Title  Patient performs standing balance activities with rolling walker support & prosthesis reaching 10" anteriorly & to floor, manage clothes modified independent.      Time  2    Period  Months    Status  On-going    Target Date  03/16/17      PT LONG TERM GOAL #4   Title  Patient ambulates 100' with RW & prosthesis modified independent to enable basic community access.     Time  2    Period  Months    Status  On-going    Target Date  03/16/17      PT LONG TERM GOAL #5   Title  Patient  negotiates ramp, curb with RW & stairs with 2 rails with transfemoral prosthesis modified independent to enable community access.      Time  2    Period  Months    Status  On-going    Target Date  03/16/17            Plan - 01/23/17 1023    Clinical Impression Statement  Patient seems to understand balance exercises / activities instructed today. He has appointment with prosthetist Thursday after PT appointment. He needs a socket revision & smaller liner as loose fit is negatively effecting balance & gait.     Rehab Potential  Good    Clinical Impairments Affecting Rehab Potential  Morbid obesity, length of time with immobility / w/c bound, lives alone.     PT Frequency  2x / week    PT Duration  8 weeks 16 weeks    PT Treatment/Interventions  ADLs/Self Care Home Management;Gait training;DME Instruction;Stair training;Functional mobility training;Therapeutic activities;Therapeutic exercise;Balance training;Neuromuscular re-education;Patient/family education;Prosthetic Training;Manual techniques    PT Next Visit Plan  prosthetic gait with RW, stairs(2 rails), ramps & curbs & balance activities decreasing UE  support.     Consulted and Agree with Plan of Care  Patient       Patient will benefit from skilled therapeutic intervention in order to improve the following deficits and impairments:  Abnormal gait, Decreased activity tolerance, Decreased balance, Decreased endurance, Decreased knowledge of use of DME, Decreased mobility, Decreased range of motion, Decreased strength, Postural dysfunction, Prosthetic Dependency, Obesity, Pain  Visit Diagnosis: Other abnormalities of gait and mobility  Muscle weakness (generalized)  Unsteadiness on feet  Abnormal posture     Problem List Patient Active Problem List   Diagnosis Date Noted  . History of nausea and vomiting 10/31/2016  . Prosthesis adjustment 08/17/2016  . History of pulmonary embolus (PE) 06/09/2016  . Normocytic anemia  01/30/2014  . Above knee amputation of right lower extremity ([place]) 12/26/2013  . GERD (gastroesophageal reflux disease) 08/14/2013  . Hyperlipidemia 02/19/2013  . S/P cardiac cath, 02/18/13, normal coronaries 02/19/2013  . CKD (chronic kidney disease) stage 3, GFR 30-59 ml/min (HCC) 02/19/2013  . Nephrotic syndrome 02/12/2012  . Essential hypertension 07/24/2011  . Long term current use of anticoagulant therapy 07/19/2011  . Back pain 07/12/2011  . History of DVT (deep vein thrombosis) 07/28/2009  . PROTEINURIA, MILD 02/01/2007  . Morbid obesity ([place]) 10/26/2005  . DM (diabetes mellitus) type I uncontrolled, periph vascular disorder ([place]) 01/09/2002    Antanette Richwine PT, DPT 01/23/2017, 10:31 AM  [facility] [address] [place], [place], [address] Phone: [phone]   Fax:  [phone]  Name: [name] MRN: [id number] Date of Birth: Sep 11, 1975

## 2017-01-25 ENCOUNTER — Ambulatory Visit: Payer: Medicare Other | Admitting: Physical Therapy

## 2017-01-25 ENCOUNTER — Encounter: Payer: Self-pay | Admitting: Physical Therapy

## 2017-01-25 DIAGNOSIS — M6281 Muscle weakness (generalized): Secondary | ICD-10-CM

## 2017-01-25 DIAGNOSIS — R2681 Unsteadiness on feet: Secondary | ICD-10-CM | POA: Diagnosis not present

## 2017-01-25 DIAGNOSIS — R2689 Other abnormalities of gait and mobility: Secondary | ICD-10-CM | POA: Diagnosis not present

## 2017-01-25 DIAGNOSIS — Z9181 History of falling: Secondary | ICD-10-CM | POA: Diagnosis not present

## 2017-01-25 DIAGNOSIS — M25651 Stiffness of right hip, not elsewhere classified: Secondary | ICD-10-CM | POA: Diagnosis not present

## 2017-01-25 DIAGNOSIS — R293 Abnormal posture: Secondary | ICD-10-CM | POA: Diagnosis not present

## 2017-01-25 NOTE — Therapy (Signed)
[facility] [address] [place], [place], [address] Phone: [phone]   Fax:  [phone]  Physical Therapy Treatment  Patient Details  Name: [name] MRN: [id number] Date of Birth: Feb 25, 1975 Referring Provider: Kalman Shan, DO   Encounter Date: 01/25/2017  PT End of Session - 01/25/17 1212    Visit Number  12    Number of Visits  26    Date for PT Re-Evaluation  03/19/17    Authorization Type  Medicare Recertification every 60 days    PT Start Time  0845    PT Stop Time  0930    PT Time Calculation (min)  45 min    Equipment Utilized During Treatment  Gait belt    Activity Tolerance  Patient tolerated treatment well    Behavior During Therapy  [facility] for tasks assessed/performed       Past Medical History:  Diagnosis Date  . Acute venous embolism and thrombosis of deep vessels of proximal lower extremity ([place]) 07/19/2011  . Anemia   . Chest pain, neg MI, normal coronaries by cath 02/18/2013  . CKD (chronic kidney disease) stage 3, GFR 30-59 ml/min (HCC) 02/19/2013  . Colon polyps   . Diabetic ulcer of right foot ([place])   . DVT (deep venous thrombosis) ([place]) 09/2002   patient reports additional DVTs in '06 & '11 (unconfirmed)  . GERD (gastroesophageal reflux disease)   . History of blood transfusion    "related to OR" (10/31/2016)  . Hyperlipidemia 02/19/2013  . Hypertension   . Nausea & vomiting    "constant for the last couple weeks" (10/31/2016)  . Nephrotic syndrome   . Obesity    BMI 44, weight 346 pounds 01/30/14  . Pulmonary embolism ([place]) 09/2002   treated with 6 months of warfarin  . Type I diabetes mellitus ([place]) dx'd 2001    Past Surgical History:  Procedure Laterality Date  . AMPUTATION Right 12/22/2013   Procedure: AMPUTATION BELOW KNEE;  Surgeon: [name], MD;  Location: [facility];  Service: Orthopedics;  Laterality: Right;  . AMPUTATION Right 02/19/2014   Procedure: RIGHT ABOVE KNEE  AMPUTATION ;  Surgeon: [name], MD;  Location: [facility];  Service: Orthopedics;  Laterality: Right;  . CARDIAC CATHETERIZATION  02/18/2013   normal coronaries  . ESOPHAGOGASTRODUODENOSCOPY N/A 11/02/2016   Procedure: ESOPHAGOGASTRODUODENOSCOPY (EGD);  Surgeon: [name], MD;  Location: [facility];  Service: Endoscopy;  Laterality: N/A;  . I&D EXTREMITY Right 12/14/2013   Procedure: IRRIGATION AND DEBRIDEMENT RIGHT FOOT;  Surgeon: [name], MD;  Location: [facility];  Service: Orthopedics;  Laterality: Right;  . INCISION AND DRAINAGE ABSCESS  2007; 2015   "back"  . INCISION AND DRAINAGE OF WOUND Right 12/22/2013   Procedure: I&D RIGHT BUTTOCK;  Surgeon: [name], MD;  Location: [facility];  Service: Orthopedics;  Laterality: Right;  . LEFT HEART CATHETERIZATION WITH CORONARY ANGIOGRAM N/A 02/18/2013   Procedure: LEFT HEART CATHETERIZATION WITH CORONARY ANGIOGRAM;  Surgeon: [name], MD;  Location: [facility] CATH LAB;  Service: Cardiovascular;  Laterality: N/A;    There were no vitals filed for this visit.  Subjective Assessment - 01/25/17 0845    Subjective  He was looking into new manual w/c custom.     Pertinent History  Right TFA, Type 1 DM, HTN, chest pain, DVT/PE, nephrotic syndrome, morbid obesity, LBP    Patient Stated Goals  To walk with prosthesis in community and stand to perform ADLs at home.  Currently in Pain?  No/denies      Prosthetic Training with Transfemoral prosthesis: Pt scheduled to see prosthetist this afternoon to address socket revision.  PT instructed with demo in positioning to stand to enable transition of weight over sound foot. Turning 45* to right to prevent posterior edge of prosthesis from position at edge of chair. Pt return demo understanding with PT stabilizing RW from 16" mat table.  Pt ambulated 54' X 2 with RW with cues on weight shift & posture.  Pt negotiated 4" step up anteriorly and step off posteriorly on scale to weigh with supervision.  (378#)                        PT Education - 01/25/17 0856    Education provided  Yes    Education Details  Concerns with K5 custom manual w/c under Medicare and trying to eventually get advanced prostheses    Person(s) Educated  Patient    Methods  Explanation;Verbal cues    Comprehension  Verbalized understanding       PT Short Term Goals - 01/23/17 1022      PT SHORT TERM GOAL #1   Title  Patient verbalizes working with prosthetist on socket revision. (All STGs Target Date: 02/16/2017)    Time  4    Period  Weeks    Status  On-going    Target Date  02/16/17      PT SHORT TERM GOAL #2   Title  Patient tolerates prosthesis wear daily >8 hrs total /day with limb pain or skin issues.     Time  4    Period  Weeks    Status  On-going    Target Date  02/16/17      PT SHORT TERM GOAL #3   Title  Patient reaches towards floor within 10" with RW support with supervision.     Time  4    Period  Weeks    Status  On-going    Target Date  02/16/17      PT SHORT TERM GOAL #4   Title  Patient ambulates 104' with RW & prosthesis with supervision.    Time  4    Period  Weeks    Status  On-going    Target Date  02/16/17      PT SHORT TERM GOAL #5   Title  Patient negotiates ramps & curbs with RW & prosthesis with supervision.     Time  4    Period  Weeks    Status  On-going    Target Date  02/16/17        PT Long Term Goals - 01/18/17 2113      PT LONG TERM GOAL #1   Title  Patient verbalizes & demonstrates proper prosthetic care to enable safe use of prosthesis. (Target Date: 03/16/2017)    Time  2    Period  Months    Status  On-going    Target Date  03/16/17      PT LONG TERM GOAL #2   Title  Patient tolerates daily wear of prosthesis >60% of awake hours without skin issues or limb pain to enable function throughout his day.    Time  2    Period  Months    Status  On-going    Target Date  03/16/17      PT LONG TERM GOAL #3   Title  Patient  performs standing  balance activities with rolling walker support & prosthesis reaching 10" anteriorly & to floor, manage clothes modified independent.      Time  2    Period  Months    Status  On-going    Target Date  03/16/17      PT LONG TERM GOAL #4   Title  Patient ambulates 100' with RW & prosthesis modified independent to enable basic community access.     Time  2    Period  Months    Status  On-going    Target Date  03/16/17      PT LONG TERM GOAL #5   Title  Patient negotiates ramp, curb with RW & stairs with 2 rails with transfemoral prosthesis modified independent to enable community access.      Time  2    Period  Months    Status  On-going    Target Date  03/16/17            Plan - 01/25/17 2115    Clinical Impression Statement  Patient appears to understand concerns with aquiring a custom manual w/c and future prostheses. He will benefit from consult during PT with certified [employer]. Patient also improved understanding of positioning prior to standing to enable to transition weight over sound limb.     Rehab Potential  Good    Clinical Impairments Affecting Rehab Potential  Morbid obesity, length of time with immobility / w/c bound, lives alone.     PT Frequency  2x / week    PT Duration  8 weeks 16 weeks    PT Treatment/Interventions  ADLs/Self Care Home Management;Gait training;DME Instruction;Stair training;Functional mobility training;Therapeutic activities;Therapeutic exercise;Balance training;Neuromuscular re-education;Patient/family education;Prosthetic Training;Manual techniques    PT Next Visit Plan  w/c consult with ATP, prosthetic gait with RW, stairs(2 rails), ramps & curbs & balance activities decreasing UE support.     Consulted and Agree with Plan of Care  Patient       Patient will benefit from skilled therapeutic intervention in order to improve the following deficits and impairments:  Abnormal gait, Decreased activity tolerance, Decreased balance,  Decreased endurance, Decreased knowledge of use of DME, Decreased mobility, Decreased range of motion, Decreased strength, Postural dysfunction, Prosthetic Dependency, Obesity, Pain  Visit Diagnosis: Other abnormalities of gait and mobility  Muscle weakness (generalized)  Unsteadiness on feet  Abnormal posture     Problem List Patient Active Problem List   Diagnosis Date Noted  . History of nausea and vomiting 10/31/2016  . Prosthesis adjustment 08/17/2016  . History of pulmonary embolus (PE) 06/09/2016  . Normocytic anemia 01/30/2014  . Above knee amputation of right lower extremity ([place]) 12/26/2013  . GERD (gastroesophageal reflux disease) 08/14/2013  . Hyperlipidemia 02/19/2013  . S/P cardiac cath, 02/18/13, normal coronaries 02/19/2013  . CKD (chronic kidney disease) stage 3, GFR 30-59 ml/min (HCC) 02/19/2013  . Nephrotic syndrome 02/12/2012  . Essential hypertension 07/24/2011  . Long term current use of anticoagulant therapy 07/19/2011  . Back pain 07/12/2011  . History of DVT (deep vein thrombosis) 07/28/2009  . PROTEINURIA, MILD 02/01/2007  . Morbid obesity ([place]) 10/26/2005  . DM (diabetes mellitus) type I uncontrolled, periph vascular disorder ([place]) 01/09/2002    Deyra Perdomo PT, DPT 01/25/2017, 9:18 PM  [facility] [address] [place], [place], [address] Phone: [phone]   Fax:  [phone]  Name: Shahzad Thomann MRN: [id number] Date of Birth: Aug 10, 1975

## 2017-01-25 NOTE — Patient Instructions (Signed)
On 01/18/2017 Weight:  W/c + prosthesis + Xzavion = 442# W/c + Rose = 425# W/c = 49#  So prosthesis / liner = 17# Ki's weight = 376#  On 01/25/2017, RW weight 11.2#, prosthesis 17#, patient + RW + prosthesis = 406.2#  So pt 378#  Standing up goal in positioning your pelvis closer to left foot when viewed from a side angle. Scoot bottom forward to edge of chair. With prosthesis you may want to angle yourself ~45* to right so prosthesis lip does not hit at edge of w/c but left foot is more under buttocks.

## 2017-01-29 ENCOUNTER — Ambulatory Visit: Payer: Medicare Other | Admitting: Physical Therapy

## 2017-02-01 ENCOUNTER — Ambulatory Visit: Payer: Medicare Other | Admitting: Physical Therapy

## 2017-02-05 ENCOUNTER — Ambulatory Visit: Payer: Medicare Other | Admitting: Physical Therapy

## 2017-02-05 ENCOUNTER — Encounter: Payer: Self-pay | Admitting: Physical Therapy

## 2017-02-05 DIAGNOSIS — R293 Abnormal posture: Secondary | ICD-10-CM

## 2017-02-05 DIAGNOSIS — M6281 Muscle weakness (generalized): Secondary | ICD-10-CM | POA: Diagnosis not present

## 2017-02-05 DIAGNOSIS — M25651 Stiffness of right hip, not elsewhere classified: Secondary | ICD-10-CM | POA: Diagnosis not present

## 2017-02-05 DIAGNOSIS — R2689 Other abnormalities of gait and mobility: Secondary | ICD-10-CM | POA: Diagnosis not present

## 2017-02-05 DIAGNOSIS — R2681 Unsteadiness on feet: Secondary | ICD-10-CM | POA: Diagnosis not present

## 2017-02-05 DIAGNOSIS — Z9181 History of falling: Secondary | ICD-10-CM | POA: Diagnosis not present

## 2017-02-05 NOTE — Therapy (Signed)
[facility] [address] [place], [place], [address] Phone: [phone]   Fax:  [phone]  Physical Therapy Treatment  Patient Details  Name: [name] MRN: [id number] Date of Birth: 1976/01/03 Referring Provider: Kalman Shan, DO   Encounter Date: 02/05/2017  PT End of Session - 02/05/17 1227    Visit Number  13    Number of Visits  26    Date for PT Re-Evaluation  03/19/17    Authorization Type  Medicare Recertification every 60 days    PT Start Time  0846    PT Stop Time  0930    PT Time Calculation (min)  44 min    Equipment Utilized During Treatment  Gait belt    Activity Tolerance  Patient tolerated treatment well    Behavior During Therapy  [facility] for tasks assessed/performed       Past Medical History:  Diagnosis Date  . Acute venous embolism and thrombosis of deep vessels of proximal lower extremity ([place]) 07/19/2011  . Anemia   . Chest pain, neg MI, normal coronaries by cath 02/18/2013  . CKD (chronic kidney disease) stage 3, GFR 30-59 ml/min (HCC) 02/19/2013  . Colon polyps   . Diabetic ulcer of right foot ([place])   . DVT (deep venous thrombosis) ([place]) 09/2002   patient reports additional DVTs in '06 & '11 (unconfirmed)  . GERD (gastroesophageal reflux disease)   . History of blood transfusion    "related to OR" (10/31/2016)  . Hyperlipidemia 02/19/2013  . Hypertension   . Nausea & vomiting    "constant for the last couple weeks" (10/31/2016)  . Nephrotic syndrome   . Obesity    BMI 44, weight 346 pounds 01/30/14  . Pulmonary embolism ([place]) 09/2002   treated with 6 months of warfarin  . Type I diabetes mellitus ([place]) dx'd 2001    Past Surgical History:  Procedure Laterality Date  . AMPUTATION Right 12/22/2013   Procedure: AMPUTATION BELOW KNEE;  Surgeon: [name], MD;  Location: [place];  Service: Orthopedics;  Laterality: Right;  . AMPUTATION Right 02/19/2014   Procedure: RIGHT ABOVE KNEE  AMPUTATION ;  Surgeon: [name], MD;  Location: [place];  Service: Orthopedics;  Laterality: Right;  . CARDIAC CATHETERIZATION  02/18/2013   normal coronaries  . ESOPHAGOGASTRODUODENOSCOPY N/A 11/02/2016   Procedure: ESOPHAGOGASTRODUODENOSCOPY (EGD);  Surgeon: [name], MD;  Location[name]COPY;  Service: Endoscopy;  Laterality: N/A;  . I&D EXTREMITY Right 12/14/2013   Procedure: IRRIGATION AND DEBRIDEMENT RIGHT FOOT;  Surgeon: [name], MD;  Location: [place];  Service: Orthopedics;  Laterality: Right;  . INCISION AND DRAINAGE ABSCESS  2007; 2015   "back"  . INCISION AND DRAINAGE OF WOUND Right 12/22/2013   Procedure: I&D RIGHT BUTTOCK;  Surgeon: [name], MD;  Location: [place];  Service: Orthopedics;  Laterality: Right;  . LEFT HEART CATHETERIZATION WITH CORONARY ANGIOGRAM N/A 02/18/2013   Procedure: LEFT HEART CATHETERIZATION WITH CORONARY ANGIOGRAM;  Surgeon: [name], MD;  Location: [facility] CATH LAB;  Service: Cardiovascular;  Laterality: N/A;    There were no vitals filed for this visit.  Subjective Assessment - 02/05/17 0758    Subjective  Patient is choosing to use [facility] for his K5 manual w/c.     Pertinent History  Right TFA, Type 1 DM, HTN, chest pain, DVT/PE, nephrotic syndrome, morbid obesity, LBP    Patient Stated Goals  To walk with prosthesis in community and stand  to perform ADLs at home.     Currently in Pain?  No/denies                      [facility] Adult PT Treatment/Exercise - 02/05/17 2312      Transfers   Transfers  Sit to Stand;Stand to Sit    Sit to Stand  5: Supervision;With upper extremity assist;From chair/3-in-1;Multiple attempts    Sit to Stand Details  Visual cues/gestures for sequencing;Verbal cues for technique    Stand to Sit  5: Supervision;With upper extremity assist;To chair/3-in-1;Uncontrolled descent      Ambulation/Gait   Ambulation/Gait  Yes    Ambulation/Gait Assistance  5: Supervision     Ambulation/Gait Assistance Details  verbal cues on posture & wt shift    Ambulation Distance (Feet)  75 Feet    Assistive device  Rolling walker;Prosthesis    Gait Pattern  Step-through pattern;Decreased stride length;Decreased stance time - right;Trunk flexed;Wide base of support;Poor foot clearance - right    Ambulation Surface  Indoor;Level      Posture/Postural Control   Posture Comments  --      Neuro Re-ed    Neuro Re-ed Details   Standing in parallel bars: static stance 38sec 2nd attempt;  head turns with intermittent UE support- pt able to look left & right and up/down 1 rep without support      Exercises   Exercises  Knee/Hip      Knee/Hip Exercises: Aerobic   Nustep  Sci Fit sitting in w/c with BUEs, LLE & prosthesis RLE Level 1 with PT cueing on need to work on his endurance and add similar exercise to his workout routine. PT used pt phone to videotape activity.       Prosthetics   Prosthetic Care Comments   Pt continues to work with prosthetist on socket revision. His socket had to be sent to [facility] facility in [facility] due to size.     Current prosthetic wear tolerance (days/week)   daily    Current prosthetic wear tolerance (#hours/day)   4-5hrs 1-3x/day Now wearing at gym during workouts more    Residual limb condition   No open areas. Incision healed with no adherances. Normal color, temperature and moisture level. Minimal hair growth. No adductor roll.     Education Provided  Proper weight-bearing schedule/adjustment;Proper wear schedule/adjustment    Person(s) Educated  Patient    Education Method  Explanation;Verbal cues    Education Method  Verbalized understanding;Verbal cues required;Needs further instruction               PT Short Term Goals - 01/23/17 1022      PT SHORT TERM GOAL #1   Title  Patient verbalizes working with prosthetist on socket revision. (All STGs Target Date: 02/16/2017)    Time  4    Period  Weeks    Status  On-going    Target Date   02/16/17      PT SHORT TERM GOAL #2   Title  Patient tolerates prosthesis wear daily >8 hrs total /day with limb pain or skin issues.     Time  4    Period  Weeks    Status  On-going    Target Date  02/16/17      PT SHORT TERM GOAL #3   Title  Patient reaches towards floor within 10" with RW support with supervision.     Time  4    Period  Weeks  Status  On-going    Target Date  02/16/17      PT SHORT TERM GOAL #4   Title  Patient ambulates 52' with RW & prosthesis with supervision.    Time  4    Period  Weeks    Status  On-going    Target Date  02/16/17      PT SHORT TERM GOAL #5   Title  Patient negotiates ramps & curbs with RW & prosthesis with supervision.     Time  4    Period  Weeks    Status  On-going    Target Date  02/16/17        PT Long Term Goals - 01/18/17 2113      PT LONG TERM GOAL #1   Title  Patient verbalizes & demonstrates proper prosthetic care to enable safe use of prosthesis. (Target Date: 03/16/2017)    Time  2    Period  Months    Status  On-going    Target Date  03/16/17      PT LONG TERM GOAL #2   Title  Patient tolerates daily wear of prosthesis >60% of awake hours without skin issues or limb pain to enable function throughout his day.    Time  2    Period  Months    Status  On-going    Target Date  03/16/17      PT LONG TERM GOAL #3   Title  Patient performs standing balance activities with rolling walker support & prosthesis reaching 10" anteriorly & to floor, manage clothes modified independent.      Time  2    Period  Months    Status  On-going    Target Date  03/16/17      PT LONG TERM GOAL #4   Title  Patient ambulates 100' with RW & prosthesis modified independent to enable basic community access.     Time  2    Period  Months    Status  On-going    Target Date  03/16/17      PT LONG TERM GOAL #5   Title  Patient negotiates ramp, curb with RW & stairs with 2 rails with transfemoral prosthesis modified independent to  enable community access.      Time  2    Period  Months    Status  On-going    Target Date  03/16/17            Plan - 02/05/17 2319    Clinical Impression Statement  Patient verbalizes understanding of need to work on his endurance and add to his workout routine. Patient increased distance to 14' today but fatigues.     Rehab Potential  Good    Clinical Impairments Affecting Rehab Potential  Morbid obesity, length of time with immobility / w/c bound, lives alone.     PT Frequency  2x / week    PT Duration  8 weeks 16 weeks    PT Treatment/Interventions  ADLs/Self Care Home Management;Gait training;DME Instruction;Stair training;Functional mobility training;Therapeutic activities;Therapeutic exercise;Balance training;Neuromuscular re-education;Patient/family education;Prosthetic Training;Manual techniques    PT Next Visit Plan  w/c consult with ATP, prosthetic gait with RW, stairs(2 rails), ramps & curbs & balance activities decreasing UE support.     Consulted and Agree with Plan of Care  Patient       Patient will benefit from skilled therapeutic intervention in order to improve the following deficits and impairments:  Abnormal gait, Decreased  activity tolerance, Decreased balance, Decreased endurance, Decreased knowledge of use of DME, Decreased mobility, Decreased range of motion, Decreased strength, Postural dysfunction, Prosthetic Dependency, Obesity, Pain  Visit Diagnosis: Other abnormalities of gait and mobility  Muscle weakness (generalized)  Unsteadiness on feet  Abnormal posture     Problem List Patient Active Problem List   Diagnosis Date Noted  . History of nausea and vomiting 10/31/2016  . Prosthesis adjustment 08/17/2016  . History of pulmonary embolus (PE) 06/09/2016  . Normocytic anemia 01/30/2014  . Above knee amputation of right lower extremity ([place]) 12/26/2013  . GERD (gastroesophageal reflux disease) 08/14/2013  . Hyperlipidemia 02/19/2013  . S/P  cardiac cath, 02/18/13, normal coronaries 02/19/2013  . CKD (chronic kidney disease) stage 3, GFR 30-59 ml/min (HCC) 02/19/2013  . Nephrotic syndrome 02/12/2012  . Essential hypertension 07/24/2011  . Long term current use of anticoagulant therapy 07/19/2011  . Back pain 07/12/2011  . History of DVT (deep vein thrombosis) 07/28/2009  . PROTEINURIA, MILD 02/01/2007  . Morbid obesity ([place]) 10/26/2005  . DM (diabetes mellitus) type I uncontrolled, periph vascular disorder ([place]) 01/09/2002    Dimple Bastyr PT, DPT 02/05/2017, 11:21 PM  [facility] [address] [place], [place], [address] Phone: [phone]   Fax:  [phone]  Name: Jihad Brownlow MRN: [id number] Date of Birth: 1975/07/31

## 2017-02-05 NOTE — Patient Instructions (Signed)
ENDURANCE Machines seated with goal to increase time first, not speed or resistance. Goal is 15-20 minutes of constant activity. Standing activities: time how long you are standing & how long you are sitting so you are aware of both. You want to increase how long you stand and decrease how long you sit between exercises. Not all standing exercises need to be weights & high intensity. You can work on standing still without support,  Standing with head turns, etc.

## 2017-02-07 ENCOUNTER — Emergency Department (HOSPITAL_COMMUNITY)
Admission: EM | Admit: 2017-02-07 | Discharge: 2017-02-07 | Disposition: A | Discharge status: Home / Self Care | Payer: Medicare Other | Attending: Emergency Medicine | Admitting: Emergency Medicine

## 2017-02-07 ENCOUNTER — Encounter (HOSPITAL_COMMUNITY): Payer: Self-pay | Admitting: Emergency Medicine

## 2017-02-07 ENCOUNTER — Emergency Department (HOSPITAL_COMMUNITY): Payer: Medicare Other

## 2017-02-07 DIAGNOSIS — Z7901 Long term (current) use of anticoagulants: Secondary | ICD-10-CM | POA: Insufficient documentation

## 2017-02-07 DIAGNOSIS — Z794 Long term (current) use of insulin: Secondary | ICD-10-CM | POA: Diagnosis not present

## 2017-02-07 DIAGNOSIS — E1022 Type 1 diabetes mellitus with diabetic chronic kidney disease: Secondary | ICD-10-CM | POA: Diagnosis not present

## 2017-02-07 DIAGNOSIS — I440 Atrioventricular block, first degree: Secondary | ICD-10-CM | POA: Diagnosis not present

## 2017-02-07 DIAGNOSIS — N183 Chronic kidney disease, stage 3 (moderate): Secondary | ICD-10-CM | POA: Diagnosis not present

## 2017-02-07 DIAGNOSIS — Z79899 Other long term (current) drug therapy: Secondary | ICD-10-CM | POA: Insufficient documentation

## 2017-02-07 DIAGNOSIS — R55 Syncope and collapse: Secondary | ICD-10-CM

## 2017-02-07 DIAGNOSIS — K5900 Constipation, unspecified: Secondary | ICD-10-CM | POA: Diagnosis not present

## 2017-02-07 DIAGNOSIS — I129 Hypertensive chronic kidney disease with stage 1 through stage 4 chronic kidney disease, or unspecified chronic kidney disease: Secondary | ICD-10-CM | POA: Insufficient documentation

## 2017-02-07 DIAGNOSIS — R404 Transient alteration of awareness: Secondary | ICD-10-CM | POA: Diagnosis not present

## 2017-02-07 DIAGNOSIS — N289 Disorder of kidney and ureter, unspecified: Secondary | ICD-10-CM

## 2017-02-07 DIAGNOSIS — R531 Weakness: Secondary | ICD-10-CM | POA: Diagnosis not present

## 2017-02-07 LAB — COMPREHENSIVE METABOLIC PANEL
ALBUMIN: 3.2 g/dL — AB (ref 3.5–5.0)
ALT: 26 U/L (ref 17–63)
ANION GAP: 11 (ref 5–15)
AST: 25 U/L (ref 15–41)
Alkaline Phosphatase: 112 U/L (ref 38–126)
BILIRUBIN TOTAL: 0.5 mg/dL (ref 0.3–1.2)
BUN: 71 mg/dL — ABNORMAL HIGH (ref 6–20)
CALCIUM: 9 mg/dL (ref 8.9–10.3)
CO2: 19 mmol/L — ABNORMAL LOW (ref 22–32)
Chloride: 103 mmol/L (ref 101–111)
Creatinine, Ser: 2.01 mg/dL — ABNORMAL HIGH (ref 0.61–1.24)
GFR calc non Af Amer: 40 mL/min — ABNORMAL LOW (ref >60)
GFR, EST AFRICAN AMERICAN: 46 mL/min — AB (ref >60)
GLUCOSE: 243 mg/dL — AB (ref 65–99)
POTASSIUM: 4.7 mmol/L (ref 3.5–5.1)
SODIUM: 133 mmol/L — AB (ref 135–145)
TOTAL PROTEIN: 7.1 g/dL (ref 6.5–8.1)

## 2017-02-07 LAB — CBC WITH DIFFERENTIAL/PLATELET
BASOS PCT: 0 %
Basophils Absolute: 0 10*3/uL (ref 0.0–0.1)
Eosinophils Absolute: 0.2 10*3/uL (ref 0.0–0.7)
Eosinophils Relative: 2 %
HEMATOCRIT: 36 % — AB (ref 39.0–52.0)
Hemoglobin: 11.6 g/dL — ABNORMAL LOW (ref 13.0–17.0)
LYMPHS ABS: 1.3 10*3/uL (ref 0.7–4.0)
Lymphocytes Relative: 12 %
MCH: 28.4 pg (ref 26.0–34.0)
MCHC: 32.2 g/dL (ref 30.0–36.0)
MCV: 88 fL (ref 78.0–100.0)
MONO ABS: 0.7 10*3/uL (ref 0.1–1.0)
MONOS PCT: 6 %
Neutro Abs: 8.8 10*3/uL — ABNORMAL HIGH (ref 1.7–7.7)
Neutrophils Relative %: 80 %
Platelets: 259 10*3/uL (ref 150–400)
RBC: 4.09 MIL/uL — ABNORMAL LOW (ref 4.22–5.81)
RDW: 13.7 % (ref 11.5–15.5)
WBC: 10.9 10*3/uL — ABNORMAL HIGH (ref 4.0–10.5)

## 2017-02-07 LAB — I-STAT TROPONIN, ED: TROPONIN I, POC: 0.01 ng/mL (ref 0.00–0.08)

## 2017-02-07 MED ORDER — PEG 3350-KCL-NA BICARB-NACL 420 G PO SOLR
4000.0000 mL | Freq: Once | ORAL | Status: AC
  Administered 2017-02-07: 4000 mL via ORAL
  Filled 2017-02-07: qty 4000

## 2017-02-07 NOTE — ED Notes (Signed)
Patient transported to X-ray 

## 2017-02-07 NOTE — ED Provider Notes (Signed)
[facility] EMERGENCY DEPARTMENT Provider Note   CSN: [id number] Arrival date & time: 02/07/17  1003     History   Chief Complaint Chief Complaint  Patient presents with  . Loss of Consciousness    HPI [name] is a 42 y.o. male.  HPI [name] is a 42 y.o. male presents to ED with syncopal episode. Pt states he has been constipated for the last several days, states was having a bowel movement, admits to a lot of straining when he fainted. When woke up, and since then he reports nausea, dizziness, lightheadedness. States felt well prior to syncopal episode. States nausea has improved, still having some dizziness.   Past Medical History:  Diagnosis Date  . Acute venous embolism and thrombosis of deep vessels of proximal lower extremity ([place]) 07/19/2011  . Anemia   . Chest pain, neg MI, normal coronaries by cath 02/18/2013  . CKD (chronic kidney disease) stage 3, GFR 30-59 ml/min (HCC) 02/19/2013  . Colon polyps   . Diabetic ulcer of right foot ([place])   . DVT (deep venous thrombosis) ([place]) 09/2002   patient reports additional DVTs in '06 & '11 (unconfirmed)  . GERD (gastroesophageal reflux disease)   . History of blood transfusion    "related to OR" (10/31/2016)  . Hyperlipidemia 02/19/2013  . Hypertension   . Nausea & vomiting    "constant for the last couple weeks" (10/31/2016)  . Nephrotic syndrome   . Obesity    BMI 44, weight 346 pounds 01/30/14  . Pulmonary embolism ([place]) 09/2002   treated with 6 months of warfarin  . Type I diabetes mellitus ([place]) dx'd 2001    Patient Active Problem List   Diagnosis Date Noted  . History of nausea and vomiting 10/31/2016  . Prosthesis adjustment 08/17/2016  . History of pulmonary embolus (PE) 06/09/2016  . Normocytic anemia 01/30/2014  . Above knee amputation of right lower extremity ([place]) 12/26/2013  . GERD (gastroesophageal reflux disease) 08/14/2013  . Hyperlipidemia 02/19/2013  . S/P cardiac cath,  02/18/13, normal coronaries 02/19/2013  . CKD (chronic kidney disease) stage 3, GFR 30-59 ml/min (HCC) 02/19/2013  . Nephrotic syndrome 02/12/2012  . Essential hypertension 07/24/2011  . Long term current use of anticoagulant therapy 07/19/2011  . Back pain 07/12/2011  . History of DVT (deep vein thrombosis) 07/28/2009  . PROTEINURIA, MILD 02/01/2007  . Morbid obesity ([place]) 10/26/2005  . DM (diabetes mellitus) type I uncontrolled, periph vascular disorder ([place]) 01/09/2002    Past Surgical History:  Procedure Laterality Date  . AMPUTATION Right 12/22/2013   Procedure: AMPUTATION BELOW KNEE;  Surgeon: [name], MD;  Location: [place];  Service: Orthopedics;  Laterality: Right;  . AMPUTATION Right 02/19/2014   Procedure: RIGHT ABOVE KNEE AMPUTATION ;  Surgeon: [name], MD;  Location: [place];  Service: Orthopedics;  Laterality: Right;  . CARDIAC CATHETERIZATION  02/18/2013   normal coronaries  . ESOPHAGOGASTRODUODENOSCOPY N/A 11/02/2016   Procedure: ESOPHAGOGASTRODUODENOSCOPY (EGD);  Surgeon: [name], MD;  Location: [facility] ENDOSCOPY;  Service: Endoscopy;  Laterality: N/A;  . I&D EXTREMITY Right 12/14/2013   Procedure: IRRIGATION AND DEBRIDEMENT RIGHT FOOT;  Surgeon: [name], MD;  Location: [place];  Service: Orthopedics;  Laterality: Right;  . INCISION AND DRAINAGE ABSCESS  2007; 2015   "back"  . INCISION AND DRAINAGE OF WOUND Right 12/22/2013   Procedure: I&D RIGHT BUTTOCK;  Surgeon: [name], MD;  Location: [place];  Service: Orthopedics;  Laterality: Right;  .  LEFT HEART CATHETERIZATION WITH CORONARY ANGIOGRAM N/A 02/18/2013   Procedure: LEFT HEART CATHETERIZATION WITH CORONARY ANGIOGRAM;  Surgeon: [name], MD;  Location: [facility] CATH LAB;  Service: Cardiovascular;  Laterality: N/A;       Home Medications    Prior to Admission medications   Medication Sig Start Date End Date Taking? Authorizing Provider  allopurinol (ZYLOPRIM) 100 MG tablet Take 2 tablets (200 mg  total) by mouth daily. 08/03/16   Kalman Shan Ratliff, DO  Alpha-Lipoic Acid 200 MG CAPS Take 1 capsule (200 mg total) by mouth daily. 08/17/16   Kalman Shan Ratliff, DO  amLODipine (NORVASC) 5 MG tablet Take 1 tablet (5 mg total) by mouth daily. 08/17/16 08/17/17  Kalman Shan Ratliff, DO  apixaban (ELIQUIS) 5 MG TABS tablet Take 1 tablet (5 mg total) by mouth 2 (two) times daily. 08/11/16   Axel Filler, MD  Blood Glucose Monitoring Suppl (ACCU-CHEK AVIVA) device Use to check blood sugar 5 times daily. Diag code E10.51. Multiple insulin dependent 08/11/16 08/11/17  Axel Filler, MD  calcitRIOL (ROCALTROL) 0.25 MCG capsule Take 1 capsule (0.25 mcg total) by mouth daily. 08/11/16   Axel Filler, MD  carvedilol (COREG) 25 MG tablet Take 1 tablet (25 mg total) by mouth 2 (two) times daily with a meal. 11/02/16   Alphonzo Grieve, MD  cloNIDine (CATAPRES) 0.2 MG tablet Take 1 tablet (0.2 mg total) by mouth 2 (two) times daily. 08/17/16 08/17/17  Kalman Shan Ratliff, DO  furosemide (LASIX) 40 MG tablet Take 2 tablets (80 mg total) by mouth 2 (two) times daily. 08/11/16   Axel Filler, MD  gabapentin (NEURONTIN) 300 MG capsule Take 1 capsule (300 mg total) by mouth 3 (three) times daily. 08/11/16   Axel Filler, MD  glucose blood (ACCU-CHEK AVIVA) test strip Use to check blood sugars 5 times a day. Dx code:E11.9. Insulin dependent. 08/14/16   Kalman Shan Ratliff, DO  insulin aspart (NOVOLOG) 100 UNIT/ML FlexPen Inject 25 Units into the skin 3 (three) times daily. 08/11/16 08/11/17  Axel Filler, MD  Insulin Degludec 200 UNIT/ML SOPN Inject 140 Units into the skin daily. 08/11/16   Axel Filler, MD  Insulin Pen Needle (PEN NEEDLES 3/16") 31G X 5 MM MISC Use five times daily 08/14/16   [name], DO  Lancet Devices [facility]) lancets Use to check blood sugars 5 times a day. Dx code:E11.9. Insulin dependent. 08/14/16   Kalman Shan Ratliff, DO  linagliptin (TRADJENTA) 5 MG TABS tablet Take 1 tablet (5 mg total) by mouth daily. 08/11/16   Axel Filler, MD  loratadine (CLARITIN) 10 MG tablet Take 10 mg by mouth daily as needed for allergies.     [provider]  lovastatin (MEVACOR) 20 MG tablet Take 1 tablet (20 mg total) by mouth daily. 06/08/16 06/08/17  Velna Ochs, MD  metolazone (ZAROXOLYN) 2.5 MG tablet Take 2.5 mg by mouth every other day.    [provider]  polyethylene glycol (MIRALAX / GLYCOLAX) packet Take 17 g by mouth daily as needed for mild constipation.     [provider]  Zinc Gluconate 100 MG TABS Take 2.2 tablets (220 mg total) by mouth daily. 08/17/16   [name], DO    Family History Family History  Problem Relation Age of Onset  . Heart disease Mother   . Diabetes Mother   . Diabetes Maternal Uncle   . Diabetes Cousin     Social  History Social History   Tobacco Use  . Smoking status: Never Smoker  . Smokeless tobacco: Never Used  Substance Use Topics  . Alcohol use: Yes    Comment: 10/31/2016 "a few beers q 6 months or so"  . Drug use: No     Allergies   Reglan [metoclopramide]   Review of Systems Review of Systems  Constitutional: Negative for chills and fever.  Respiratory: Negative for cough, chest tightness and shortness of breath.   Cardiovascular: Negative for chest pain, palpitations and leg swelling.  Gastrointestinal: Positive for nausea. Negative for abdominal distention, abdominal pain, diarrhea and vomiting.  Genitourinary: Negative for dysuria, frequency, hematuria and urgency.  Musculoskeletal: Negative for arthralgias, myalgias, neck pain and neck stiffness.  Skin: Negative for rash.  Allergic/Immunologic: Negative for immunocompromised state.  Neurological: Positive for dizziness, syncope and light-headedness. Negative for weakness, numbness and headaches.     Physical Exam Updated Vital  Signs Pulse 93   Temp 97.8 F (36.6 C) (Oral)   SpO2 98%   Physical Exam  Constitutional: He is oriented to person, place, and time. He appears well-developed and well-nourished. No distress.  HENT:  Head: Normocephalic and atraumatic.  Eyes: Conjunctivae and EOM are normal. Pupils are equal, round, and reactive to light.  Neck: Normal range of motion. Neck supple.  Cardiovascular: Normal rate, regular rhythm and normal heart sounds.  No murmur heard. Pulmonary/Chest: Effort normal. No respiratory distress. He has no wheezes. He has no rales.  Abdominal: Soft. Bowel sounds are normal. He exhibits no distension. There is tenderness. There is no rebound.  Diffuse tenderness  Musculoskeletal: He exhibits no edema.  Neurological: He is alert and oriented to person, place, and time. He displays normal reflexes. No cranial nerve deficit. Coordination normal.  Skin: Skin is warm and dry.  Nursing note and vitals reviewed.    ED Treatments / Results  Labs (all labs ordered are listed, but only abnormal results are displayed) Labs Reviewed  CBC WITH DIFFERENTIAL/PLATELET - Abnormal; Notable for the following components:      Result Value   WBC 10.9 (*)    RBC 4.09 (*)    Hemoglobin 11.6 (*)    HCT 36.0 (*)    Neutro Abs 8.8 (*)    All other components within normal limits  COMPREHENSIVE METABOLIC PANEL - Abnormal; Notable for the following components:   Sodium 133 (*)    CO2 19 (*)    Glucose, Bld 243 (*)    BUN 71 (*)    Creatinine, Ser 2.01 (*)    Albumin 3.2 (*)    GFR calc non Af Amer 40 (*)    GFR calc Af Amer 46 (*)    All other components within normal limits  I-STAT TR[place]ONIN, ED    EKG  EKG Interpretation  Date/Time:  Wednesday February 07 2017 10:14:37 EST Ventricular Rate:  98 PR Interval:    QRS Duration: 88 QT Interval:  342 QTC Calculation: 437 R Axis:   -27 Text Interpretation:  Sinus rhythm Prolonged PR interval Borderline left axis deviation Abnormal  R-wave progression, late transition Nonspecific T abnormalities, lateral leads ST elev, probable normal early repol pattern new 1st degree heart block Confirmed by Malvin Johns [phone]) on 02/07/2017 10:46:00 AM       Radiology Dg Abd Acute W/chest  Result Date: 02/07/2017 CLINICAL DATA:  Constipation with abdominal pain. EXAM: DG ABDOMEN ACUTE W/ 1V CHEST COMPARISON:  10/24/2016 CT.  Chest radiograph 01/30/2014. FINDINGS: Degraded exam secondary  to patient body habitus. Frontal view of the chest demonstrates midline trachea. Borderline cardiomegaly. No pleural effusion or pneumothorax. Low lung volumes. Clear lungs. Minimal motion degradation. Supine views of the abdomen and pelvis demonstrate a large amount of colonic stool. No gaseous distention of bowel loops. Right-sided decubitus views demonstrate no significant air-fluid levels or evidence of free intraperitoneal air. IMPRESSION: No acute findings. Possible constipation. Decreased sensitivity and specificity exam due to technique related factors, as described above. Electronically Signed   By: [name] Miyamoto M.D.   On: 02/07/2017 13:16    Procedures Procedures (including critical care time)  Medications Ordered in ED Medications  polyethylene glycol-electrolytes (NuLYTELY/GoLYTELY) solution 4,000 mL (4,000 mLs Oral Given 02/07/17 1516)     Initial Impression / Assessment and Plan / ED Course  I have reviewed the triage vital signs and the nursing notes.  Pertinent labs & imaging results that were available during my care of the patient were reviewed by me and considered in my medical decision making (see chart for details).     Patient in emergency department with a syncopal episode while straining on the commode.  He is only complaining of constipation and still mild dizziness.  Will check labs, will get x-rays of the chest and abdomen, will monitor.  EKG showing prolonged PR.  Will have patient follow-up with cardiology.  He has  not had any new medications recently.  His glucose is 243, however he has not taken any medications today.  Has not taken his insulin.  His BUN and creatinine elevated, although his creatinine is close to his baseline, BUN is higher than usual.  Patient did say that he has not been eating or drinking as much due to his constipation.  His abdomen is diffusely tender, however no guarding or rebound tenderness.  Chest x-ray unremarkable, abdominal film shows possible constipation.  We discussed treatment plan with patient, and he stated that nothing works for his constipation except for GoLYTELY.  He also requested [place] to give it to him here so he can take at home.  He states that he takes half of GoLYTELY with orange juice.  I will ordered from the pharmacy.  Vitals:   02/07/17 1415 02/07/17 1430 02/07/17 1445 02/07/17 1551  BP: (!) 155/72 (!) 151/77 (!) 142/69 137/83  Pulse:    89  Resp: (!) 30 16 (!) 21 (!) 24  Temp:      TempSrc:      SpO2:    94%     Final Clinical Impressions(s) / ED Diagnoses   Final diagnoses:  Constipation, unspecified constipation type  Vasovagal syncope  Prolonged P-R interval  Renal insufficiency    ED Discharge Orders    None       Jeannett Senior, PA-C 02/07/17 2034    Malvin Johns, MD 02/09/17 1400

## 2017-02-07 NOTE — ED Triage Notes (Signed)
PT on commode attempting to have BM- constipated. He passed out. C/O now of dizziness. Nauseated at time but resolved. R AKA phantom pain. CBG 234. Aide at home at time.

## 2017-02-07 NOTE — Discharge Instructions (Signed)
Take GoLYTELY for your constipation.  Please follow-up with your family doctor as well as with cardiology.  Return if worsening symptoms.

## 2017-02-07 NOTE — ED Notes (Signed)
Urinal at bedside.  

## 2017-02-07 NOTE — ED Notes (Signed)
Pt back from XR 

## 2017-02-07 NOTE — ED Notes (Signed)
Spoke with XR- pt should be finished in 5-10 minutes.

## 2017-02-07 NOTE — ED Notes (Signed)
EDP at bedside  

## 2017-02-08 ENCOUNTER — Ambulatory Visit: Payer: Medicare Other | Admitting: Physical Therapy

## 2017-02-14 NOTE — Progress Notes (Signed)
   CC: Follow up on essential hypertension and type II diabetes  HPI:  [name] is a 42 y.o. male with history noted below that presents to the [facility] for follow up on essential hypertension and type II diabetes.  Please see problem based charting for the status of patient's chronic medical conditions.   Past Medical History:  Diagnosis Date  . Acute venous embolism and thrombosis of deep vessels of proximal lower extremity ([place]) 07/19/2011  . Anemia   . Chest pain, neg MI, normal coronaries by cath 02/18/2013  . CKD (chronic kidney disease) stage 3, GFR 30-59 ml/min (HCC) 02/19/2013  . Colon polyps   . Diabetic ulcer of right foot ([place])   . DVT (deep venous thrombosis) ([place]) 09/2002   patient reports additional DVTs in '06 & '11 (unconfirmed)  . GERD (gastroesophageal reflux disease)   . History of blood transfusion    "related to OR" (10/31/2016)  . Hyperlipidemia 02/19/2013  . Hypertension   . Nausea & vomiting    "constant for the last couple weeks" (10/31/2016)  . Nephrotic syndrome   . Obesity    BMI 44, weight 346 pounds 01/30/14  . Pulmonary embolism ([place]) 09/2002   treated with 6 months of warfarin  . Type I diabetes mellitus ([place]) dx'd 2001    Review of Systems:  Review of Systems  Respiratory: Negative for shortness of breath.   Cardiovascular: Negative for chest pain.  Gastrointestinal: Positive for constipation.    Physical Exam:  Vitals:   02/15/17 1505  BP: 134/61  Pulse: 80  Temp: 97.6 F (36.4 C)  TempSrc: Oral  SpO2: 98%   Physical Exam  Constitutional: He is well-developed, well-nourished, and in no distress.  Cardiovascular: Normal rate, regular rhythm and normal heart sounds. Exam reveals no gallop and no friction rub.  No murmur heard. 2+ pedal pulse on left  Pulmonary/Chest: Effort normal and breath sounds normal. No respiratory distress. He has no wheezes. He has no rales.  Musculoskeletal: He exhibits edema.  Right  AKA  Skin: Skin is warm and dry.     Assessment & Plan:   See encounters tab for problem based medical decision making.   Patient discussed with Dr. Evette Doffing

## 2017-02-15 ENCOUNTER — Encounter: Payer: Self-pay | Admitting: Internal Medicine

## 2017-02-15 ENCOUNTER — Encounter: Payer: Self-pay | Admitting: Physical Therapy

## 2017-02-15 ENCOUNTER — Ambulatory Visit (INDEPENDENT_AMBULATORY_CARE_PROVIDER_SITE_OTHER): Payer: Medicare Other | Admitting: Internal Medicine

## 2017-02-15 ENCOUNTER — Ambulatory Visit: Payer: Medicare Other | Attending: Internal Medicine | Admitting: Physical Therapy

## 2017-02-15 VITALS — BP 134/61 | HR 80 | Temp 97.6°F

## 2017-02-15 DIAGNOSIS — M25651 Stiffness of right hip, not elsewhere classified: Secondary | ICD-10-CM | POA: Diagnosis not present

## 2017-02-15 DIAGNOSIS — E1051 Type 1 diabetes mellitus with diabetic peripheral angiopathy without gangrene: Secondary | ICD-10-CM

## 2017-02-15 DIAGNOSIS — IMO0002 Reserved for concepts with insufficient information to code with codable children: Secondary | ICD-10-CM

## 2017-02-15 DIAGNOSIS — K5900 Constipation, unspecified: Secondary | ICD-10-CM

## 2017-02-15 DIAGNOSIS — I10 Essential (primary) hypertension: Secondary | ICD-10-CM

## 2017-02-15 DIAGNOSIS — R2681 Unsteadiness on feet: Secondary | ICD-10-CM | POA: Insufficient documentation

## 2017-02-15 DIAGNOSIS — R293 Abnormal posture: Secondary | ICD-10-CM

## 2017-02-15 DIAGNOSIS — R2689 Other abnormalities of gait and mobility: Secondary | ICD-10-CM

## 2017-02-15 DIAGNOSIS — E1065 Type 1 diabetes mellitus with hyperglycemia: Secondary | ICD-10-CM | POA: Diagnosis not present

## 2017-02-15 DIAGNOSIS — M6281 Muscle weakness (generalized): Secondary | ICD-10-CM | POA: Diagnosis not present

## 2017-02-15 LAB — POCT GLYCOSYLATED HEMOGLOBIN (HGB A1C): Hemoglobin A1C: 7.9

## 2017-02-15 LAB — GLUCOSE, CAPILLARY: GLUCOSE-CAPILLARY: 134 mg/dL — AB (ref 65–99)

## 2017-02-15 MED ORDER — SENNOSIDES-DOCUSATE SODIUM 8.6-50 MG PO TABS: 1.0000 | ORAL_TABLET | Freq: Every day | ORAL | 11 refills | Status: DC

## 2017-02-15 NOTE — Therapy (Signed)
[facility] [address] [place], [place], [address] Phone: [phone]   Fax:  [phone]  Physical Therapy Treatment  Patient Details  Name: [name] MRN: [id number] Date of Birth: September 04, 1975 Referring Provider: Kalman Shan, DO   Encounter Date: 02/15/2017  PT End of Session - 02/15/17 1342    Visit Number  14    Number of Visits  26    Date for PT Re-Evaluation  03/19/17    Authorization Type  Medicare Recertification every 60 days    PT Start Time  0845    PT Stop Time  0930    PT Time Calculation (min)  45 min    Equipment Utilized During Treatment  Gait belt    Activity Tolerance  Patient tolerated treatment well    Behavior During Therapy  [facility] for tasks assessed/performed       Past Medical History:  Diagnosis Date  . Acute venous embolism and thrombosis of deep vessels of proximal lower extremity ([place]) 07/19/2011  . Anemia   . Chest pain, neg MI, normal coronaries by cath 02/18/2013  . CKD (chronic kidney disease) stage 3, GFR 30-59 ml/min (HCC) 02/19/2013  . Colon polyps   . Diabetic ulcer of right foot ([place])   . DVT (deep venous thrombosis) ([place]) 09/2002   patient reports additional DVTs in '06 & '11 (unconfirmed)  . GERD (gastroesophageal reflux disease)   . History of blood transfusion    "related to OR" (10/31/2016)  . Hyperlipidemia 02/19/2013  . Hypertension   . Nausea & vomiting    "constant for the last couple weeks" (10/31/2016)  . Nephrotic syndrome   . Obesity    BMI 44, weight 346 pounds 01/30/14  . Pulmonary embolism ([place]) 09/2002   treated with 6 months of warfarin  . Type I diabetes mellitus ([place]) dx'd 2001    Past Surgical History:  Procedure Laterality Date  . AMPUTATION Right 12/22/2013   Procedure: AMPUTATION BELOW KNEE;  Surgeon: [name], MD;  Location: [facility];  Service: Orthopedics;  Laterality: Right;  . AMPUTATION Right 02/19/2014   Procedure: RIGHT ABOVE KNEE  AMPUTATION ;  Surgeon: [name], MD;  Location: [facility];  Service: Orthopedics;  Laterality: Right;  . CARDIAC CATHETERIZATION  02/18/2013   normal coronaries  . ESOPHAGOGASTRODUODENOSCOPY N/A 11/02/2016   Procedure: ESOPHAGOGASTRODUODENOSCOPY (EGD);  Surgeon: [name], MD;  Location[facility]COPY;  Service: Endoscopy;  Laterality: N/A;  . I&D EXTREMITY Right 12/14/2013   Procedure: IRRIGATION AND DEBRIDEMENT RIGHT FOOT;  Surgeon: [name], MD;  Location: [facility];  Service: Orthopedics;  Laterality: Right;  . INCISION AND DRAINAGE ABSCESS  2007; 2015   "back"  . INCISION AND DRAINAGE OF WOUND Right 12/22/2013   Procedure: I&D RIGHT BUTTOCK;  Surgeon: [name], MD;  Location: [facility];  Service: Orthopedics;  Laterality: Right;  . LEFT HEART CATHETERIZATION WITH CORONARY ANGIOGRAM N/A 02/18/2013   Procedure: LEFT HEART CATHETERIZATION WITH CORONARY ANGIOGRAM;  Surgeon: [name], MD;  Location: [facility] CATH LAB;  Service: Cardiovascular;  Laterality: N/A;    There were no vitals filed for this visit.  Subjective Assessment - 02/15/17 0840    Subjective  Patient reports very fatigued today as he had to keep his 3yo & 18yo nephews last night & get them up for school this morning. His bowel impaction cleared and no abdominal pain. He has resumed wearing prosthesis 9 hrs total per day. Prosthetist called yesterday and he is  going next Thursday for more measurements.     Pertinent History  Right TFA, Type 1 DM, HTN, chest pain, DVT/PE, nephrotic syndrome, morbid obesity, LBP    Patient Stated Goals  To walk with prosthesis in community and stand to perform ADLs at home.     Currently in Pain?  No/denies                      [facility] Adult PT Treatment/Exercise - 02/15/17 0845      Transfers   Transfers  Sit to Stand;Stand to Sit    Sit to Stand  5: Supervision;With upper extremity assist;From chair/3-in-1;Multiple attempts    Stand to Sit  5: Supervision;With upper  extremity assist;To chair/3-in-1;Uncontrolled descent      Ambulation/Gait   Ambulation/Gait  Yes    Ambulation/Gait Assistance  5: Supervision    Ambulation/Gait Assistance Details  verbal cues on posture & wt shift over stance limb.     Ambulation Distance (Feet)  70 Feet    Assistive device  Rolling walker;Prosthesis    Gait Pattern  Step-through pattern;Decreased stride length;Decreased stance time - right;Trunk flexed;Wide base of support;Poor foot clearance - right    Ambulation Surface  Indoor;Level      Neuro Re-ed    Neuro Re-ed Details   standing in parallel bars: with UE support reaches towards floor within 9".  Pt had video of him standing, reaching to box (appears ~15" ht) picking up weighted ball and tossing over each shoulder. He had to immediately touch bars for BUE support.  PT placed soccer ball on 14" box anterior: pt able to pick up ball, find balance point, toss over shoulder with control, hold balance 3-5 seconds before requiring BUE support. PT progressed to 12" box then 6.6# ball.       Prosthetics   Prosthetic Care Comments   working with prosthetist on socket revision.     Current prosthetic wear tolerance (days/week)   daily    Current prosthetic wear tolerance (#hours/day)   up to 9 hrs total per pt    Residual limb condition   No open areas. Incision healed with no adherances. Normal color, temperature and moisture level. Minimal hair growth. No adductor roll.     Education Provided  Proper wear schedule/adjustment    Person(s) Educated  Patient    Education Method  Explanation;Verbal cues    Education Method  Verbalized understanding               PT Short Term Goals - 02/15/17 1343      PT SHORT TERM GOAL #1   Title  Patient verbalizes working with prosthetist on socket revision. (All STGs Target Date: 02/16/2017)    Baseline  MET 02/15/2017    Time  4    Period  Weeks    Status  Achieved      PT SHORT TERM GOAL #2   Title  Patient tolerates  prosthesis wear daily >8 hrs total /day with limb pain or skin issues.     Baseline  MET 02/15/2017    Time  4    Period  Weeks    Status  Achieved      PT SHORT TERM GOAL #3   Title  Patient reaches towards floor within 10" with RW support with supervision.     Baseline  MET 02/15/2017    Time  4    Period  Weeks    Status  Achieved  PT SHORT TERM GOAL #4   Title  Patient ambulates 102' with RW & prosthesis with supervision.    Baseline  MET 02/15/2017    Time  4    Period  Weeks    Status  Achieved      PT SHORT TERM GOAL #5   Title  Patient negotiates ramps & curbs with RW & prosthesis with supervision.     Time  4    Period  Weeks    Status  On-going        PT Long Term Goals - 01/18/17 2113      PT LONG TERM GOAL #1   Title  Patient verbalizes & demonstrates proper prosthetic care to enable safe use of prosthesis. (Target Date: 03/16/2017)    Time  2    Period  Months    Status  On-going    Target Date  03/16/17      PT LONG TERM GOAL #2   Title  Patient tolerates daily wear of prosthesis >60% of awake hours without skin issues or limb pain to enable function throughout his day.    Time  2    Period  Months    Status  On-going    Target Date  03/16/17      PT LONG TERM GOAL #3   Title  Patient performs standing balance activities with rolling walker support & prosthesis reaching 10" anteriorly & to floor, manage clothes modified independent.      Time  2    Period  Months    Status  On-going    Target Date  03/16/17      PT LONG TERM GOAL #4   Title  Patient ambulates 100' with RW & prosthesis modified independent to enable basic community access.     Time  2    Period  Months    Status  On-going    Target Date  03/16/17      PT LONG TERM GOAL #5   Title  Patient negotiates ramp, curb with RW & stairs with 2 rails with transfemoral prosthesis modified independent to enable community access.      Time  2    Period  Months    Status  On-going    Target  Date  03/16/17            Plan - 02/15/17 1345    Clinical Impression Statement  Patient met 4 of 5 STGs set for this 30 day period. He appears to understand need to include stabliization / balance in exercise tossing ball over his shoulder.     Rehab Potential  Good    Clinical Impairments Affecting Rehab Potential  Morbid obesity, length of time with immobility / w/c bound, lives alone.     PT Frequency  2x / week    PT Duration  8 weeks 16 weeks    PT Treatment/Interventions  ADLs/Self Care Home Management;Gait training;DME Instruction;Stair training;Functional mobility training;Therapeutic activities;Therapeutic exercise;Balance training;Neuromuscular re-education;Patient/family education;Prosthetic Training;Manual techniques    PT Next Visit Plan  w/c consult with [facility] on 2/13, check remaining STGs, prosthetic gait with RW, stairs(2 rails), ramps & curbs & balance activities decreasing UE support.     Consulted and Agree with Plan of Care  Patient       Patient will benefit from skilled therapeutic intervention in order to improve the following deficits and impairments:  Abnormal gait, Decreased activity tolerance, Decreased balance, Decreased endurance, Decreased knowledge of use of DME,  Decreased mobility, Decreased range of motion, Decreased strength, Postural dysfunction, Prosthetic Dependency, Obesity, Pain  Visit Diagnosis: Other abnormalities of gait and mobility  Muscle weakness (generalized)  Unsteadiness on feet  Abnormal posture     Problem List Patient Active Problem List   Diagnosis Date Noted  . History of nausea and vomiting 10/31/2016  . Prosthesis adjustment 08/17/2016  . History of pulmonary embolus (PE) 06/09/2016  . Normocytic anemia 01/30/2014  . Above knee amputation of right lower extremity ([place]) 12/26/2013  . GERD (gastroesophageal reflux disease) 08/14/2013  . Hyperlipidemia 02/19/2013  . S/P cardiac cath, 02/18/13, normal coronaries 02/19/2013   . CKD (chronic kidney disease) stage 3, GFR 30-59 ml/min (HCC) 02/19/2013  . Nephrotic syndrome 02/12/2012  . Essential hypertension 07/24/2011  . Long term current use of anticoagulant therapy 07/19/2011  . Back pain 07/12/2011  . History of DVT (deep vein thrombosis) 07/28/2009  . PROTEINURIA, MILD 02/01/2007  . Morbid obesity ([place]) 10/26/2005  . DM (diabetes mellitus) type I uncontrolled, periph vascular disorder ([place]) 01/09/2002    Jaelyne Deeg  PT, DPT 02/15/2017, 1:53 PM  [facility] [address] [place], [place], [address] Phone: [phone]   Fax:  [phone]  Name: [name] MRN: [id number] Date of Birth: December 25, 1975

## 2017-02-15 NOTE — Patient Instructions (Signed)
Mr. Gaster,  It was a pleasure seeing you today. Please follow up in 3 months.

## 2017-02-15 NOTE — Patient Instructions (Signed)
Picking up ball & toss 1. Try to stabilize with shoulders over hips over feet prior to tossing ball. Toss ball over both shoulders. Goal is able to stabilize after the toss for up to 10 seconds without touching.  2. Increase difficulty by lowering height of block so reach closer to floor. Use lower weighted ball when you lower height until you master the balance / stabilization & reach.

## 2017-02-16 LAB — MICROALBUMIN / CREATININE URINE RATIO
Creatinine, Urine: 103.8 mg/dL
Microalb/Creat Ratio: 1528.4 mg/g creat — ABNORMAL HIGH (ref 0.0–30.0)
Microalbumin, Urine: 1586.5 ug/mL

## 2017-02-16 NOTE — Assessment & Plan Note (Signed)
Assessment:  Constipation Patient is wheelchair bound due to morbid obesity and right AKA.  Patient has had constipation recently. And was seen in the ED for this issue one week ago. He usually has one bowel movement a day. He has tried fiber to help keep regular. Will prescribe Senokot to use when patient hasn't had a bowel movement in 2-3 days.  Plan -Senokot

## 2017-02-16 NOTE — Progress Notes (Signed)
Internal Medicine Clinic Attending  Case discussed with Dr. Hoffman at the time of the visit.  We reviewed the resident's history and exam and pertinent patient test results.  I agree with the assessment, diagnosis, and plan of care documented in the resident's note.  

## 2017-02-16 NOTE — Assessment & Plan Note (Signed)
Assessment:  Essential hypertension Patient currently takes carvedilol 25mg  BID, lasix 80mg  BID, metolazone 2.5mg  every other day, clonidine 0.2mg  BID and amlodipine 5mg  daily.  Today's blood pressure is 134/61, stable and at goal.  Plan -continue current blood pressure regimen

## 2017-02-16 NOTE — Assessment & Plan Note (Signed)
Assessment:  Type II diabetes Mellitus Patient currently takes novolog 25 units TID and tresiba 140 daily. Today's hemoglobin A1C is 7.9.   previous A1c was 6.3. Patient states that he has been eating more sugar and carbs in his diet lately. He states that sometimes he misses his insulin doses. Discussed improving diets with fruits and vegetables. Will continue with current med regimen   Plan - hemoglobin A1C - foot exam - Continue Tresiba 140 units daily  - Continue Novolog 25 units TID with meals   - urine microalbumin

## 2017-02-21 ENCOUNTER — Ambulatory Visit: Payer: Medicare Other | Admitting: Physical Therapy

## 2017-02-21 ENCOUNTER — Encounter: Payer: Self-pay | Admitting: Physical Therapy

## 2017-02-21 DIAGNOSIS — R2681 Unsteadiness on feet: Secondary | ICD-10-CM | POA: Diagnosis not present

## 2017-02-21 DIAGNOSIS — R293 Abnormal posture: Secondary | ICD-10-CM

## 2017-02-21 DIAGNOSIS — M25651 Stiffness of right hip, not elsewhere classified: Secondary | ICD-10-CM | POA: Diagnosis not present

## 2017-02-21 DIAGNOSIS — R2689 Other abnormalities of gait and mobility: Secondary | ICD-10-CM

## 2017-02-21 DIAGNOSIS — M6281 Muscle weakness (generalized): Secondary | ICD-10-CM | POA: Diagnosis not present

## 2017-02-21 NOTE — Therapy (Signed)
[facility] [address] [place], [place], [address] Phone: [phone]   Fax:  [phone]  Physical Therapy Treatment  Patient Details  Name: [name] MRN: [id number] Date of Birth: 17-Dec-1975 Referring Provider: Kalman Shan, [place]   Encounter Date: 02/21/2017  PT End of Session - 02/21/17 0912    Visit Number  15    Number of Visits  26    Date for PT Re-Evaluation  03/19/17    Authorization Type  Medicare Recertification every 60 days    PT Start Time  0750    PT Stop Time  0905    PT Time Calculation (min)  75 min    Equipment Utilized During Treatment  Gait belt    Activity Tolerance  Patient tolerated treatment well    Behavior During Therapy  [name] for tasks assessed/performed       Past Medical History:  Diagnosis Date  . Acute venous embolism and thrombosis of deep vessels of proximal lower extremity ([facility]) 07/19/2011  . Anemia   . Chest pain, neg MI, normal coronaries by cath 02/18/2013  . CKD (chronic kidney disease) stage 3, GFR 30-59 ml/min ([facility]) 02/19/2013  . Colon polyps   . Diabetic ulcer of right foot ([facility])   . DVT (deep venous thrombosis) ([facility]) 09/2002   patient reports additional DVTs in '06 & '11 (unconfirmed)  . GERD (gastroesophageal reflux disease)   . History of blood transfusion    "related to OR" (10/31/2016)  . Hyperlipidemia 02/19/2013  . Hypertension   . Nausea & vomiting    "constant for the last couple weeks" (10/31/2016)  . Nephrotic syndrome   . Obesity    BMI 44, weight 346 pounds 01/30/14  . Pulmonary embolism ([facility]) 09/2002   treated with 6 months of warfarin  . Type I diabetes mellitus ([facility]) dx'd 2001    Past Surgical History:  Procedure Laterality Date  . AMPUTATION Right 12/22/2013   Procedure: AMPUTATION BELOW KNEE;  Surgeon: Victor Simmer, MD;  Location: [facility];  Service: Orthopedics;  Laterality: Right;  . AMPUTATION Right 02/19/2014   Procedure: RIGHT ABOVE KNEE  AMPUTATION ;  Surgeon: Victor Simmer, MD;  Location: [facility];  Service: Orthopedics;  Laterality: Right;  . CARDIAC CATHETERIZATION  02/18/2013   normal coronaries  . ESOPHAGOGASTRODUODENOSCOPY N/A 11/02/2016   Procedure: ESOPHAGOGASTRODUODENOSCOPY (EGD);  Surgeon: Victor Mayer, MD;  Location: [facility] ENDOSCOPY;  Service: Endoscopy;  Laterality: N/A;  . I&D EXTREMITY Right 12/14/2013   Procedure: IRRIGATION AND DEBRIDEMENT RIGHT FOOT;  Surgeon: Victor Schooling, MD;  Location: [facility];  Service: Orthopedics;  Laterality: Right;  . INCISION AND DRAINAGE ABSCESS  2007; 2015   "back"  . INCISION AND DRAINAGE OF WOUND Right 12/22/2013   Procedure: I&D RIGHT BUTTOCK;  Surgeon: Victor Simmer, MD;  Location: [facility];  Service: Orthopedics;  Laterality: Right;  . LEFT HEART CATHETERIZATION WITH CORONARY ANGIOGRAM N/A 02/18/2013   Procedure: LEFT HEART CATHETERIZATION WITH CORONARY ANGIOGRAM;  Surgeon: Victor M Martinique, MD;  Location: [facility] CATH LAB;  Service: Cardiovascular;  Laterality: N/A;    There were no vitals filed for this visit.  Subjective Assessment - 02/21/17 0748    Subjective  Patient reports current w/c is not working with constant repairs & he wants a w/c with solid frame.     Pertinent History  Right TFA, Type 1 DM, HTN, chest pain, DVT/PE, nephrotic syndrome, morbid obesity, LBP    Patient Stated Goals  To walk with  prosthesis in community and stand to perform ADLs at [facility].     Currently in Pain?  No/denies        Mobility/Seating Evaluation    PATIENT INFORMATION: Name: [name] DOB: [birth year]  Sex: Male Date seen: 02/21/2017 Time: 8:00  Address:  [address] [place] [address] Physician: [name] This evaluation/justification form will serve as the LMN for the following suppliers: __________________________ Supplier: [name]  Contact Person: [name], [name] Phone:  [phone]   Seating Therapist: Jamey Kelley, PT, DPT Phone:   [phone]   Phone:  [phone]    Spouse/Parent/Caregiver name: [name], brother  Phone number: [phone] Insurance/Payer: Medicare & Medicaid     Reason for Referral: custom K5 manual w/c  Patient/Caregiver Goals: get a manual w/c that will hold up & get around Kelley & community.   Patient was seen for face-to-face evaluation for new manual wheelchair.  Also present was [name], ATP to discuss recommendations and wheelchair options.  Further paperwork was completed and sent to vendor.  Patient appears to qualify for manual mobility device at this time per objective findings.   MEDICAL HISTORY: Diagnosis: Primary Diagnosis: Right Transfemoral Amputation (AKA) Onset: 02/19/2014 Diagnosis: Right TFA, Type 1 DM, HTN, chest pain, DVT/PE, nephrotic syndrome, morbid obesity   [] Progressive Disease Relevant past and future surgeries: Above knee amputation 02/19/2014   Height: 6'2" Weight: 376# Explain recent changes or trends in weight: Trying to loose weight. He has lost 20# over 2 months.    History including Falls: no falls    [facility] ENVIRONMENT: [] House  [] Condo/town [place]  [x] Apartment  [] Assisted Living    [x] Lives Alone []  Lives with Others                                                                                          Hours with caregiver: ?????  [x] [place] is accessible to patient           Stairs      [] Yes []  No     Ramp [x] Yes [] No Comments:  1st floor w/c accessible apt   COMMUNITY ADL: TRANSPORTATION: [] Car    [] Van    [x] Public Transportation    [] Adapted w/c Lift    [] Ambulance    [] Other:       [] Sits in wheelchair during transport  Employment/School: taking one class online.  Specific requirements pertaining to mobility ?????  Other: ?????    FUNCTIONAL/SENSORY PROCESSING SKILLS:  Handedness:   [x] Right     [] Left    [] NA  Comments:  ?????  Functional Processing Skills for Wheeled Mobility [x] Processing Skills are adequate for safe wheelchair operation  Areas of concern  than may interfere with safe operation of wheelchair Description of problem   []  Attention to environment      [] Judgment      []  Hearing  []  Vision or visual processing      [] Motor Planning  []  Fluctuations in Behavior  ?????    VERBAL COMMUNICATION: [x] WFL receptive [x]  WFL expressive [x] Understandable  [] Difficult to understand  [] non-communicative []  Uses an augmented communication device  CURRENT SEATING / MOBILITY:  Current Mobility Base:  [] None [] Dependent [x] Manual [] Scooter [] Power  Type of Control: ?????  Manufacturer:  Direct supplySize:  22" X 18"Age: unknown denoted to him  Current Condition of Mobility Base:  Poor, brakes are loose, missing armrests, back & bottom upholsetry torn, hammock & coming loose from frame.    Current Wheelchair components:  standard  Describe posture in present seating system:  slumped with head forward, rounded shoulders, weight shift to left      SENSATION and SKIN ISSUES: Sensation [x] Intact  [] Impaired [] Absent  Level of sensation: ????? Pressure Relief: Able to perform effective pressure relief :    [x] Yes  []  No Method: boosting If not, Why?: ?????  Skin Issues/Skin Integrity Current Skin Issues  [] Yes [] No [] Intact []  Red area[]  Open Area  [] Scar Tissue [] At risk from prolonged sitting Where  ?????  History of Skin Issues  [] Yes [x] No Where  ????? When  ?????  Hx of skin flap surgeries  [] Yes [] No Where  ????? When  ?????  Limited sitting tolerance [] Yes [x] No Hours spent sitting in wheelchair daily: He stays in w/c most awake hours of 16+ hours/day  Complaint of Pain:  Please describe: none   Swelling/Edema: left leg pitting edema   ADL STATUS (in reference to wheelchair use):  Indep Assist Unable Indep with Equip Not assessed Comments  Dressing ????? ????? ????? X ????? sitting in w/c or bed  Eating ????? ????? ????? X ????? sitting in w/c  Toileting ????? ????? ????? X ????? transfers to/from toilet to w/c  Bathing ?????  ????? ????? X ????? sits on tub bench  Grooming/Hygiene ????? ????? ????? X ????? sitting  Meal Prep ????? ????? ????? X ????? sitting in w/c  IADLS X ????? ????? ????? ????? ?????  Bowel Management: [x] Continent  [] Incontinent  [] Accidents Comments:  ?????  Bladder Management: [x] Continent  [] Incontinent  [x] Accidents Comments:  occasional     WHEELCHAIR SKILLS: Manual w/c Propulsion: [x] UE or LE strength and endurance sufficient to participate in ADLs using manual wheelchair Arm : [] left [] right   [x] Both      Distance: 200'+ Foot:  [] left [] right   [] Both  Operate Scooter: []  Strength, hand grip, balance and transfer appropriate for use [] Living environment is accessible for use of scooter  Operate Power w/c:  []  Std. Joystick   []  Alternative Controls Indep []  Assist []  Dependent/unable []  N/A []   [] Safe          []  Functional      Distance: ?????  Bed confined without wheelchair [x]  Yes []  No   STRENGTH/RANGE OF MOTION:  Passive Range of Motion Strength  Shoulder right flexion 175* abduction 165*  left flexion 175* abduction 165* 5/5 flexion & abduction bil.  Elbow bil. flexion & extension full range bil. flexion & extension 5/5  Wrist/Hand bil. full range 5/5 bilateral  Hip flexion left 110*, right 105, adduction left 24*, right 20* bilateral flexion 5/5, abduction left 4/5, right 4-/5, bilateral extension 3+/5  Knee left WFL, right NA left flexion & extension 5/5  Ankle left plantarflexion 35*, dorsiflexion 0* (neutral), right NA left dorsiflexion 5/5 Plantarflexion 5/5     MOBILITY/BALANCE:  []  Patient is totally dependent for mobility  ?????    Balance Transfers Ambulation  Sitting Balance: Standing Balance: [x]  Independent []  Independent/Modified Independent  [x]  WFL     []  WFL []  Supervision [x]  Supervision  []  Uses UE for balance  [x]  Supervision []  Min Assist []  Ambulates with Assist  ?????    []   Min Assist []  Min assist []  Mod Assist [x]  Ambulates with Device:       [x]  RW  []  StW  []  Cane  [x]  Transfemoral prosthesis  []  Mod Assist []  Mod assist []  Max assist   []  Max Assist []  Max assist []  Dependent []  Indep. Short Distance Only  []  Unable []  Unable []  Lift / Sling Required Distance (in feet)  65'   []  Sliding board []  Unable to Ambulate (see explanation below)  Cardio Status:  [x] Intact  []  Impaired   []  NA     ?????  Respiratory Status:  [x] Intact   [] Impaired   [] NA     ?????  Orthotics/Prosthetics: Transfemoral (AKA) Prosthesis   Comments (Address manual vs power w/c vs scooter): Lateral scooting transfer independent. Sit to/from stand w/c to RW requires UE assist modified independent. Patient ambulates 2' with RW & prosthesis with supervision at 0.77 ft/sec.Standing balance with RW support reaches 10" & to knee level with supervision and without UE support up to 7sec.          Anterior / Posterior Obliquity Rotation-Pelvis ?????  PELVIS    []  [x]  []   Neutral Posterior Anterior  [x]  []  []   WFL Rt elev Lt elev  [x]  []  []   WFL Right Left                      Anterior    Anterior     []  Fixed []  Other []  Partly Flexible [x]  Flexible   []  Fixed []  Other []  Partly Flexible  []  Flexible  []  Fixed []  Other []  Partly Flexible  []  Flexible   TRUNK  []  [x]  []   WFL ? Thoracic ? Lumbar  Kyphosis Lordosis  [x]  []  []   WFL Convex Convex  Right Left [] c-curve [] s-curve [] multiple  []  Neutral [x]  Left-anterior []  Right-anterior     []  Fixed []  Flexible []  Partly Flexible []  Other  []  Fixed []  Flexible []  Partly Flexible []  Other  []  Fixed             []  Flexible []  Partly Flexible []  Other    Position Windswept  ?????  HIPS          []            [x]               []    Neutral       Abduct        ADduct         [x]           []            []   Neutral Right           Left      []  Fixed []  Subluxed []  Partly Flexible []  Dislocated []  Flexible  []  Fixed []  Other []  Partly Flexible  []  Flexible                 Foot Positioning Knee  Positioning  ?????    []  WFL  [x] Lt [] Rt [x]  WFL  [] Lt [] Rt    KNEES ROM concerns: ROM concerns:    & Dorsi-Flexed [x] Lt [] Rt ?????    FEET Plantar Flexed [] Lt [] Rt      Inversion                 [] Lt [] Rt      Eversion                 []   Lt [] Rt     HEAD [x]  Functional []  Good Head Control  ?????  & []  Flexed         []  Extended []  Adequate Head Control    NECK []  Rotated  Lt  []  Lat Flexed Lt []  Rotated  Rt []  Lat Flexed Rt []  Limited Head Control     []  Cervical Hyperextension []  Absent  Head Control     SHOULDERS ELBOWS WRIST& HAND ?????      Left     Right    Left     Right    Left     Right   U/E [x] Functional           [x] Functional [facility] WFL [] Fisting             [] Fisting      [] elev   [] dep      [] elev   [] dep       [] pro -[] retract     [] pro  [] retract [] subluxed             [] subluxed           Goals for Wheelchair Mobility  [x]  Independence with mobility in the [place] with motor related ADLs (MRADLs)  [x]  Independence with MRADLs in the community []  Provide dependent mobility  []  Provide recline     [] Provide tilt   Goals for Seating system [x]  Optimize pressure distribution [x]  Provide support needed to facilitate function or safety []  Provide corrective forces to assist with maintaining or improving posture []  Accommodate client's posture:   current seated postures and positions are not flexible or will not tolerate corrective forces []  Client to be independent with relieving pressure in the wheelchair [] Enhance physiological function such as breathing, swallowing, digestion  Simulation ideas/Equipment trials:????? State why other equipment was unsuccessful:Non-rigid frame not holding up to date for daily use. Rear axles are not adjustable for optimal propulsion placing patient at risk for shoulder injury, Seating systems causes slumped posture with increased risk of UE orthopedic issues & low back issues.    MOBILITY BASE RECOMMENDATIONS and JUSTIFICATION: MOBILITY  COMPONENT JUSTIFICATION  Manufacturer: InvacareModel: Top End Titanum   Size: Width 24"Seat Depth 20" [x] provide transport from point A to B      [x] promote Indep mobility  [x] is not a safe, functional ambulator [x] walker or cane inadequate [x] non-standard width/depth necessary to accommodate anatomical measurement [x]  Titanum due to 376# weight  [x] Manual Mobility Base [x] non-functional ambulator    [] Scooter/POV  [] can safely operate  [] can safely transfer   [] has adequate trunk stability  [] cannot functionally propel manual w/c  [] Power Mobility Base  [] non-ambulatory  [] cannot functionally propel manual wheelchair  []  cannot functionally and safely operate scooter/POV [] can safely operate and willing to  [] Stroller Base [] infant/child  [] unable to propel manual wheelchair [] allows for growth [] non-functional ambulator [] non-functional UE [] Indep mobility is not a goal at this time  [] Tilt  [] Forward [] Backward [] Powered tilt  [] Manual tilt  [] change position against gravitational force on head and shoulders  [] change position for pressure relief/cannot weight shift [] transfers  [] management of tone [] rest periods [] control edema [] facilitate postural control  []  ?????  [] Recline  [] Power recline on power base [] Manual recline on manual base  [] accommodate femur to back angle  [] bring to full recline for ADL care  [] change position for pressure relief/cannot weight shift [] rest periods [] repositioning for transfers or clothing/diaper /catheter changes [] head positioning  [x] Lighter weight required [x] self- propulsion  [x] lifting [x]  morbid obesity  limits lifting & endurance for propulsion  [x] Heavy Duty required [x] user weight greater than 250# [] extreme tone/ over active movement [x] broken frame on previous chair [x]  heavy duty titanum upgrade due to wt  [x]  Back  []  Angle Adjustable []  Custom molded tension adjustable general use [x] postural control [] control of  tone/spasticity [] accommodation of range of motion [] UE functional control [] accommodation for seating system []  ????? [] provide lateral trunk support [] accommodate deformity [x] provide posterior trunk support [x] provide lumbar/sacral support [] support trunk in midline [x] Pressure relief over spinal processes  [x]  Seat Cushion comfort company curve general use [] impaired sensation  [] decubitus ulcers present [] history of pressure ulceration [] prevent pelvic extension [] low maintenance  [x] stabilize pelvis  [] accommodate obliquity [] accommodate multiple deformity [] neutralize lower extremity position [x] increase pressure distribution []  ?????  []  Pelvic/thigh support  []  Lateral thigh guide []  Distal medial pad  []  Distal lateral pad []  pelvis in neutral [] accommodate pelvis []  position upper legs []  alignment []  accommodate ROM []  decr adduction [] accommodate tone [] removable for transfers [] decr abduction  []  Lateral trunk Supports []  Lt     []  Rt [] decrease lateral trunk leaning [] control tone [] contour for increased contact [] safety  [] accommodate asymmetry []  ?????  []  Mounting hardware  [] lateral trunk supports  [] back   [] seat [] headrest      []  thigh support [] fixed   [] swing away [] attach seat platform/cushion to w/c frame [] attach back cushion to w/c frame [] mount postural supports [] mount headrest  [] swing medial thigh support away [] swing lateral supports away for transfers  []  ?????    Armrests  [] fixed [x] adjustable height [x] removable   [x] swing away  [] flip back   [] reclining [] full length pads [] desk    [x] pads tubular  [x] provide support with elbow at 90   [] provide support for w/c tray [x] change of height/angles for variable activities [x] remove for transfers [x] allow to come closer to table top [x] remove for access to tables []  ?????  Hangers/ Leg rests  [] 60 [] 70 [] 90 [] elevating [] heavy duty  [] articulating [] fixed [] lift off [] swing away      [] power [] provide LE support  [] accommodate to hamstring tightness [] elevate legs during recline   [] provide change in position for Legs [] Maintain placement of feet on footplate [] durability [] enable transfers [] decrease edema [] Accommodate lower leg length []  ?????  Foot support Footplate    [] Lt  []  Rt  [x]  Center mount [x] flip up     [x] depth/angle adjustable [] Amputee adapter    []  Lt     []  Rt [x] provide foot support [x] accommodate to ankle ROM [] transfers [] Provide support for residual extremity [x]  allow foot to go under wheelchair base []  decrease tone  []  ?????  [x]  Ankle strap/heel loops/ calf strap [] support foot on foot support [] decrease extraneous movement [] provide input to heel  [] protect foot [x]  protect LE from falling & injury with DM  Tires: [] pneumatic  [x] flat free inserts  [] solid  [x] decrease maintenance  [x] prevent frequent flats [] increase shock absorbency [] decrease pain from road shock [] decrease spasms from road shock []  ?????  []  Headrest  [] provide posterior head support [] provide posterior neck support [] provide lateral head support [] provide anterior head support [] support during tilt and recline [] improve feeding   [] improve respiration [] placement of switches [] safety  [] accommodate ROM  [] accommodate tone [] improve visual orientation  []  Anterior chest strap []  Vest []  Shoulder retractors  [] decrease forward movement of shoulder [] accommodation of TLSO [] decrease forward movement of trunk [] decrease shoulder elevation [] added abdominal support [] alignment [] assistance with shoulder control  []  ?????  Pelvic Positioner [] Belt [] SubASIS  bar [] Dual Pull [] stabilize tone [] decrease falling out of chair/ **will not Decr potential for sliding due to pelvic tilting [] prevent excessive rotation [] pad for protection over boney prominence [] prominence comfort [] special pull angle to control rotation []  ?????  Upper Extremity  Support [] L   []  R [] Arm trough    [] hand support []  tray       [] full tray [] swivel mount [] decrease edema      [] decrease subluxation   [] control tone   [] placement for AAC/Computer/EADL [] decrease gravitational pull on shoulders [] provide midline positioning [] provide support to increase UE function [] provide hand support in natural position [] provide work surface   POWER WHEELCHAIR CONTROLS  [] Proportional  [] Non-Proportional Type ????? [] Left  [] Right [] provides access for controlling wheelchair   [] lacks motor control to operate proportional drive control [] unable to understand proportional controls  Actuator Control Module  [] Single  [] Multiple   [] Allow the client to operate the power seat function(s) through the joystick control   [] Safety Reset Switches [] Used to change modes and stop the wheelchair when driving in latch mode    [] Upgraded Electronics   [] programming for accurate control [] progressive Disease/changing condition [] non-proportional drive control needed [] Needed in order to operate power seat functions through joystick control   [] Display box [] Allows user to see in which mode and drive the wheelchair is set  [] necessary for alternate controls    [] Digital interface electronics [] Allows w/c to operate when using alternative drive controls  [] ASL Head Array [] Allows client to operate wheelchair  through switches placed in tri-panel headrest  [] Sip and puff with tubing kit [] needed to operate sip and puff drive controls  [] Upgraded tracking electronics [] increase safety when driving [] correct tracking when on uneven surfaces  [] Mount for switches or joystick [] Attaches switches to w/c  [] Swing away for access or transfers [] midline for optimal placement [] provides for consistent access  [] Attendant controlled joystick plus mount [] safety [] long distance driving [] operation of seat functions [] compliance with transportation regulations []  ?????    Rear  wheel placement/Axle adjustability [] None [] semi adjustable [x] fully adjustable  [x] improved UE access to wheels [x] improved stability [x] changing angle in space for improvement of postural stability [] 1-arm drive access [] amputee pad placement []  ?????  Wheel rims/ hand rims  [x] metal  [] plastic coated [] oblique projections [] vertical projections [x] Provide ability to propel manual wheelchair  []  Increase self-propulsion with hand weakness/decreased grasp  Push handles [] extended  [] angle adjustable  [x] standard [] caregiver access [x] caregiver assist [x] allows "hooking" to enable increased ability to perform ADLs or maintain balance  One armed device  [] Lt   [] Rt [] enable propulsion of manual wheelchair with one arm   []  ?????   Brake/wheel lock extension []  Lt   []  Rt [] increase indep in applying wheel locks   [] Side guards [] prevent clothing getting caught in wheel or becoming soiled []  prevent skin tears/abrasions  Battery: ????? [] to power wheelchair ?????  Other: anti-tippers prevent tipping chair back ?????  The above equipment has a life- long use expectancy. Growth and changes in medical and/or functional conditions would be the exceptions. This is to certify that the therapist has no financial relationship with durable medical provider or manufacturer. The therapist will not receive remuneration of any kind for the equipment recommended in this evaluation.   Patient has mobility limitation that significantly impairs safe, timely participation in one or more mobility related ADL's.  (bathing, toileting, feeding, dressing, grooming, moving from room to room)                                                             [  x] Yes []  No Will mobility device sufficiently improve ability to participate and/or be aided in participation of MRADL's?         [x]  Yes []  No Can limitation be compensated for with use of a cane or walker?                                                                                 []  Yes [x]  No Does patient or caregiver demonstrate ability/potential ability & willingness to safely use the mobility device?   [x]  Yes []  No Does patient's [place] environment support use of recommended mobility device?                                                    [x]  Yes []  No Does patient have sufficient upper extremity function necessary to functionally propel a manual wheelchair?    [x]  Yes []  No Does patient have sufficient strength and trunk stability to safely operate a POV (scooter)?                                  []  Yes []  No Does patient need additional features/benefits provided by a power wheelchair for MRADL's in the [place]?       []  Yes []  No Does the patient demonstrate the ability to safely use a power wheelchair?                                                              []  Yes []  No  Therapist Name Printed: [name], PT, DPT Date: 02/21/2017  Therapist's Signature:   Date:   Supplier's Name Printed: [name], [employer] Date: 02/21/2017  Supplier's Signature:   Date:  Patient/Caregiver Signature:   Date:     This is to certify that I have read this evaluation and Kelley agree with the content within:      Physician's Name Printed: [name]  Physician's Signature:  Date:          This is to certify that I, the above signed therapist have the following affiliations: []  This DME provider []  Manufacturer of recommended equipment []  Patient's long term care facility [x]  None of the above                         PT Education - 02/21/17 0911    Education provided  Yes    Education Details  recommendation for K5 custom w/c & face to face with [name] to sign off on form     Person(s) Educated  Patient    Methods  Explanation    Comprehension  Verbalized understanding       PT Short Term Goals - 02/15/17 1343  PT SHORT TERM GOAL #1   Title  Patient verbalizes working with prosthetist on  socket revision. (All STGs Target Date: 02/16/2017)    Baseline  MET 02/15/2017    Time  4    Period  Weeks    Status  Achieved      PT SHORT TERM GOAL #2   Title  Patient tolerates prosthesis wear daily >8 hrs total /day with limb pain or skin issues.     Baseline  MET 02/15/2017    Time  4    Period  Weeks    Status  Achieved      PT SHORT TERM GOAL #3   Title  Patient reaches towards floor within 10" with RW support with supervision.     Baseline  MET 02/15/2017    Time  4    Period  Weeks    Status  Achieved      PT SHORT TERM GOAL #4   Title  Patient ambulates 76' with RW & prosthesis with supervision.    Baseline  MET 02/15/2017    Time  4    Period  Weeks    Status  Achieved      PT SHORT TERM GOAL #5   Title  Patient negotiates ramps & curbs with RW & prosthesis with supervision.     Time  4    Period  Weeks    Status  On-going        PT Long Term Goals - 01/18/17 2113      PT LONG TERM GOAL #1   Title  Patient verbalizes & demonstrates proper prosthetic care to enable safe use of prosthesis. (Target Date: 03/16/2017)    Time  2    Period  Months    Status  On-going    Target Date  03/16/17      PT LONG TERM GOAL #2   Title  Patient tolerates daily wear of prosthesis >60% of awake hours without skin issues or limb pain to enable function throughout his day.    Time  2    Period  Months    Status  On-going    Target Date  03/16/17      PT LONG TERM GOAL #3   Title  Patient performs standing balance activities with rolling walker support & prosthesis reaching 10" anteriorly & to floor, manage clothes modified independent.      Time  2    Period  Months    Status  On-going    Target Date  03/16/17      PT LONG TERM GOAL #4   Title  Patient ambulates 100' with RW & prosthesis modified independent to enable basic community access.     Time  2    Period  Months    Status  On-going    Target Date  03/16/17      PT LONG TERM GOAL #5   Title  Patient negotiates  ramp, curb with RW & stairs with 2 rails with transfemoral prosthesis modified independent to enable community access.      Time  2    Period  Months    Status  On-going    Target Date  03/16/17            Plan - 02/21/17 0917    Clinical Impression Statement  Patient appears to need & would benefit from K5 solid frame manual w/c with noted additions.     Rehab Potential  Good  Clinical Impairments Affecting Rehab Potential  Morbid obesity, length of time with immobility / w/c bound, lives alone.     PT Frequency  2x / week    PT Duration  8 weeks 16 weeks    PT Treatment/Interventions  ADLs/Self Care Kelley Management;Gait training;DME Instruction;Stair training;Functional mobility training;Therapeutic activities;Therapeutic exercise;Balance training;Neuromuscular re-education;Patient/family education;Prosthetic Training;Manual techniques    PT Next Visit Plan  check remaining STGs, prosthetic gait with RW, stairs(2 rails), ramps & curbs & balance activities decreasing UE support.     Consulted and Agree with Plan of Care  Patient       Patient will benefit from skilled therapeutic intervention in order to improve the following deficits and impairments:  Abnormal gait, Decreased activity tolerance, Decreased balance, Decreased endurance, Decreased knowledge of use of DME, Decreased mobility, Decreased range of motion, Decreased strength, Postural dysfunction, Prosthetic Dependency, Obesity, Pain  Visit Diagnosis: Other abnormalities of gait and mobility  Muscle weakness (generalized)  Unsteadiness on feet  Abnormal posture     Problem List Patient Active Problem List   Diagnosis Date Noted  . History of nausea and vomiting 10/31/2016  . Prosthesis adjustment 08/17/2016  . History of pulmonary embolus (PE) 06/09/2016  . Normocytic anemia 01/30/2014  . Constipation   . Above knee amputation of right lower extremity ([place]) 12/26/2013  . GERD (gastroesophageal reflux  disease) 08/14/2013  . Hyperlipidemia 02/19/2013  . S/P cardiac cath, 02/18/13, normal coronaries 02/19/2013  . CKD (chronic kidney disease) stage 3, GFR 30-59 ml/min (HCC) 02/19/2013  . Nephrotic syndrome 02/12/2012  . Essential hypertension 07/24/2011  . Long term current use of anticoagulant therapy 07/19/2011  . Back pain 07/12/2011  . History of DVT (deep vein thrombosis) 07/28/2009  . PROTEINURIA, MILD 02/01/2007  . Morbid obesity ([place]) 10/26/2005  . DM (diabetes mellitus) type I uncontrolled, periph vascular disorder ([place]) 01/09/2002    Mathew Storck PT, DPT 02/21/2017, 9:30 AM  [facility] [address] [place], [place], [address] Phone: [phone]   Fax:  [phone]  Name: [name] MRN: [id number] Date of Birth: [birth year]

## 2017-02-22 ENCOUNTER — Telehealth: Payer: Self-pay | Admitting: Physical Therapy

## 2017-02-22 ENCOUNTER — Encounter: Payer: Self-pay | Admitting: Physical Therapy

## 2017-02-22 ENCOUNTER — Ambulatory Visit: Payer: Medicare Other | Admitting: Physical Therapy

## 2017-02-22 ENCOUNTER — Encounter: Payer: Medicare Other | Admitting: Internal Medicine

## 2017-02-22 DIAGNOSIS — R293 Abnormal posture: Secondary | ICD-10-CM | POA: Diagnosis not present

## 2017-02-22 DIAGNOSIS — M25651 Stiffness of right hip, not elsewhere classified: Secondary | ICD-10-CM | POA: Diagnosis not present

## 2017-02-22 DIAGNOSIS — R2681 Unsteadiness on feet: Secondary | ICD-10-CM | POA: Diagnosis not present

## 2017-02-22 DIAGNOSIS — M6281 Muscle weakness (generalized): Secondary | ICD-10-CM

## 2017-02-22 DIAGNOSIS — R2689 Other abnormalities of gait and mobility: Secondary | ICD-10-CM | POA: Diagnosis not present

## 2017-02-22 NOTE — Telephone Encounter (Signed)
Thanks

## 2017-02-22 NOTE — Patient Instructions (Signed)
Body wt + w/c = 431 W/c = 48 So body wt = 383# Ht = 6'2" Using [employer] BMI calculator = 54.1%

## 2017-02-22 NOTE — Therapy (Signed)
[facility] [address] [place], [place], [address] Phone: [phone]   Fax:  [phone]  Physical Therapy Treatment  Patient Details  Name: [name] MRN: [id number] Date of Birth: 1975-06-30 Referring Provider: Kalman Shan, DO   Encounter Date: 02/22/2017  PT End of Session - 02/22/17 1428    Visit Number  16    Number of Visits  26    Date for PT Re-Evaluation  03/19/17    Authorization Type  Medicare Recertification every 60 days    PT Start Time  0845    PT Stop Time  0930    PT Time Calculation (min)  45 min    Equipment Utilized During Treatment  Gait belt    Activity Tolerance  Patient tolerated treatment well    Behavior During Therapy  [facility] for tasks assessed/performed       Past Medical History:  Diagnosis Date  . Acute venous embolism and thrombosis of deep vessels of proximal lower extremity ([place]) 07/19/2011  . Anemia   . Chest pain, neg MI, normal coronaries by cath 02/18/2013  . CKD (chronic kidney disease) stage 3, GFR 30-59 ml/min (HCC) 02/19/2013  . Colon polyps   . Diabetic ulcer of right foot ([place])   . DVT (deep venous thrombosis) ([place]) 09/2002   patient reports additional DVTs in '06 & '11 (unconfirmed)  . GERD (gastroesophageal reflux disease)   . History of blood transfusion    "related to OR" (10/31/2016)  . Hyperlipidemia 02/19/2013  . Hypertension   . Nausea & vomiting    "constant for the last couple weeks" (10/31/2016)  . Nephrotic syndrome   . Obesity    BMI 44, weight 346 pounds 01/30/14  . Pulmonary embolism ([place]) 09/2002   treated with 6 months of warfarin  . Type I diabetes mellitus ([place]) dx'd 2001    Past Surgical History:  Procedure Laterality Date  . AMPUTATION Right 12/22/2013   Procedure: AMPUTATION BELOW KNEE;  Surgeon: [name], MD;  Location: [facility];  Service: Orthopedics;  Laterality: Right;  . AMPUTATION Right 02/19/2014   Procedure: RIGHT ABOVE KNEE  AMPUTATION ;  Surgeon: [name], MD;  Location: [facility];  Service: Orthopedics;  Laterality: Right;  . CARDIAC CATHETERIZATION  02/18/2013   normal coronaries  . ESOPHAGOGASTRODUODENOSCOPY N/A 11/02/2016   Procedure: ESOPHAGOGASTRODUODENOSCOPY (EGD);  Surgeon: [name], MD;  Location: [facility] ENDOSCOPY;  Service: Endoscopy;  Laterality: N/A;  . I&D EXTREMITY Right 12/14/2013   Procedure: IRRIGATION AND DEBRIDEMENT RIGHT FOOT;  Surgeon: [name], MD;  Location: [facility];  Service: Orthopedics;  Laterality: Right;  . INCISION AND DRAINAGE ABSCESS  2007; 2015   "back"  . INCISION AND DRAINAGE OF WOUND Right 12/22/2013   Procedure: I&D RIGHT BUTTOCK;  Surgeon: [name], MD;  Location: [facility];  Service: Orthopedics;  Laterality: Right;  . LEFT HEART CATHETERIZATION WITH CORONARY ANGIOGRAM N/A 02/18/2013   Procedure: LEFT HEART CATHETERIZATION WITH CORONARY ANGIOGRAM;  Surgeon: [name], MD;  Location: [facility] CATH LAB;  Service: Cardiovascular;  Laterality: N/A;    There were no vitals filed for this visit.  Subjective Assessment - 02/22/17 0835    Subjective  The [employer] that is making his new socket closed so they are sending it to a company in [place]. He is wearing over 6 hours /day & using in his apt.     Pertinent History  Right TFA, Type 1 DM, HTN, chest pain, DVT/PE, nephrotic  syndrome, morbid obesity, LBP    Patient Stated Goals  To walk with prosthesis in community and stand to perform ADLs at home.     Currently in Pain?  No/denies       Body wt + w/c = 431 W/c = 48 So body wt = 383# Ht = 6'2" Using [employer] BMI calculator = 54.1%               [facility] Adult PT Treatment/Exercise - 02/22/17 0845      Transfers   Transfers  Sit to Stand;Stand to Sit    Sit to Stand  5: Supervision;With upper extremity assist;From chair/3-in-1;Multiple attempts    Stand to Sit  5: Supervision;With upper extremity assist;To chair/3-in-1;Uncontrolled descent       Ambulation/Gait   Ambulation/Gait  Yes    Ambulation/Gait Assistance  5: Supervision    Ambulation Distance (Feet)  110 Feet    Assistive device  Rolling walker;Prosthesis    Gait Pattern  Step-through pattern;Decreased stride length;Decreased stance time - right;Trunk flexed;Wide base of support;Poor foot clearance - right    Ambulation Surface  Indoor;Level    Ramp  5: Supervision RW & prosthesis    Ramp Details (indicate cue type and reason)  verbal & demo cues on posture & wt shift    Curb  5: Supervision RW & prosthesis    Curb Details (indicate cue type and reason)  verbal cues on sequence      Neuro Re-ed    Neuro Re-ed Details   --      Prosthetics   Prosthetic Care Comments   PT reviewed goal to increase wear to most of awake hours & then increasing activity tolerance by ambulating in home to change locations or ADLs.    Current prosthetic wear tolerance (days/week)   daily    Current prosthetic wear tolerance (#hours/day)   up to 9 hrs total per pt    Residual limb condition   No open areas. Incision healed with no adherances. Normal color, temperature and moisture level. Minimal hair growth. No adductor roll.     Education Provided  Proper wear schedule/adjustment    Person(s) Educated  Patient    Education Method  Explanation;Verbal cues    Education Method  Verbalized understanding;Verbal cues required;Needs further instruction             PT Education - 02/21/17 0911    Education provided  Yes    Education Details  recommendation for K5 custom w/c & face to face with Kalman Shan, DO to sign off on form     Person(s) Educated  Patient    Methods  Explanation    Comprehension  Verbalized understanding       PT Short Term Goals - 02/22/17 1434      PT SHORT TERM GOAL #1   Title  Patient verbalizes working with prosthetist on socket revision. (All STGs Target Date: 02/16/2017)    Baseline  MET 02/15/2017    Time  4    Period  Weeks    Status  Achieved       PT SHORT TERM GOAL #2   Title  Patient tolerates prosthesis wear daily >8 hrs total /day with limb pain or skin issues.     Baseline  MET 02/15/2017    Time  4    Period  Weeks    Status  Achieved      PT SHORT TERM GOAL #3   Title  Patient  reaches towards floor within 10" with RW support with supervision.     Baseline  MET 02/15/2017    Time  4    Period  Weeks    Status  Achieved      PT SHORT TERM GOAL #4   Title  Patient ambulates 17' with RW & prosthesis with supervision.    Baseline  MET 02/15/2017    Time  4    Period  Weeks    Status  Achieved      PT SHORT TERM GOAL #5   Title  Patient negotiates ramps & curbs with RW & prosthesis with supervision.     Baseline  MET 02/22/2017    Time  4    Period  Weeks    Status  Achieved        PT Long Term Goals - 01/18/17 2113      PT LONG TERM GOAL #1   Title  Patient verbalizes & demonstrates proper prosthetic care to enable safe use of prosthesis. (Target Date: 03/16/2017)    Time  2    Period  Months    Status  On-going    Target Date  03/16/17      PT LONG TERM GOAL #2   Title  Patient tolerates daily wear of prosthesis >60% of awake hours without skin issues or limb pain to enable function throughout his day.    Time  2    Period  Months    Status  On-going    Target Date  03/16/17      PT LONG TERM GOAL #3   Title  Patient performs standing balance activities with rolling walker support & prosthesis reaching 10" anteriorly & to floor, manage clothes modified independent.      Time  2    Period  Months    Status  On-going    Target Date  03/16/17      PT LONG TERM GOAL #4   Title  Patient ambulates 100' with RW & prosthesis modified independent to enable basic community access.     Time  2    Period  Months    Status  On-going    Target Date  03/16/17      PT LONG TERM GOAL #5   Title  Patient negotiates ramp, curb with RW & stairs with 2 rails with transfemoral prosthesis modified independent to enable  community access.      Time  2    Period  Months    Status  On-going    Target Date  03/16/17            Plan - 02/22/17 1432    Clinical Impression Statement  Patient improved distance of gait today with improved weight shift and posture. He also improved ability to negotiate ramps & curbs with RW & prosthesis.     Rehab Potential  Good    Clinical Impairments Affecting Rehab Potential  Morbid obesity, length of time with immobility / w/c bound, lives alone.     PT Frequency  2x / week    PT Duration  8 weeks 16 weeks    PT Treatment/Interventions  ADLs/Self Care Home Management;Gait training;DME Instruction;Stair training;Functional mobility training;Therapeutic activities;Therapeutic exercise;Balance training;Neuromuscular re-education;Patient/family education;Prosthetic Training;Manual techniques    PT Next Visit Plan  work towards [place], prosthetic gait with RW, stairs(2 rails), ramps & curbs & balance activities decreasing UE support.     Consulted and Agree with Plan of Care  Patient  Patient will benefit from skilled therapeutic intervention in order to improve the following deficits and impairments:  Abnormal gait, Decreased activity tolerance, Decreased balance, Decreased endurance, Decreased knowledge of use of DME, Decreased mobility, Decreased range of motion, Decreased strength, Postural dysfunction, Prosthetic Dependency, Obesity, Pain  Visit Diagnosis: Other abnormalities of gait and mobility  Muscle weakness (generalized)  Unsteadiness on feet  Abnormal posture     Problem List Patient Active Problem List   Diagnosis Date Noted  . History of nausea and vomiting 10/31/2016  . Prosthesis adjustment 08/17/2016  . History of pulmonary embolus (PE) 06/09/2016  . Normocytic anemia 01/30/2014  . Constipation   . Above knee amputation of right lower extremity ([place]) 12/26/2013  . GERD (gastroesophageal reflux disease) 08/14/2013  . Hyperlipidemia  02/19/2013  . S/P cardiac cath, 02/18/13, normal coronaries 02/19/2013  . CKD (chronic kidney disease) stage 3, GFR 30-59 ml/min (HCC) 02/19/2013  . Nephrotic syndrome 02/12/2012  . Essential hypertension 07/24/2011  . Long term current use of anticoagulant therapy 07/19/2011  . Back pain 07/12/2011  . History of DVT (deep vein thrombosis) 07/28/2009  . PROTEINURIA, MILD 02/01/2007  . Morbid obesity ([place]) 10/26/2005  . DM (diabetes mellitus) type I uncontrolled, periph vascular disorder ([place]) 01/09/2002    Syliva Mee PT, DPT 02/22/2017, 2:35 PM  [facility] [address] [place], [place], [address] Phone: [phone]   Fax:  [phone]  Name: [name] MRN: [id number] Date of Birth: 13-Mar-1975

## 2017-02-22 NOTE — Telephone Encounter (Signed)
I wanted to make sure you had [name]'s weight for his appointment.  Body wt + w/c = 431# W/c = 48# So body wt = 383# Ht = 6'2" Using [employer] BMI calculator = 54.1%

## 2017-02-27 ENCOUNTER — Ambulatory Visit: Payer: Medicare Other | Admitting: Physical Therapy

## 2017-02-27 ENCOUNTER — Encounter: Payer: Self-pay | Admitting: Physical Therapy

## 2017-02-27 DIAGNOSIS — R293 Abnormal posture: Secondary | ICD-10-CM | POA: Diagnosis not present

## 2017-02-27 DIAGNOSIS — R2689 Other abnormalities of gait and mobility: Secondary | ICD-10-CM

## 2017-02-27 DIAGNOSIS — M6281 Muscle weakness (generalized): Secondary | ICD-10-CM

## 2017-02-27 DIAGNOSIS — R2681 Unsteadiness on feet: Secondary | ICD-10-CM

## 2017-02-27 DIAGNOSIS — M25651 Stiffness of right hip, not elsewhere classified: Secondary | ICD-10-CM | POA: Diagnosis not present

## 2017-02-28 NOTE — Therapy (Signed)
[facility] [address] [place], [place], [address] Phone: [phone]   Fax:  [phone]  Physical Therapy Treatment  Patient Details  Name: [name] MRN: [id number] Date of Birth: 03/15/1975 Referring Provider: Kalman Shan, DO   Encounter Date: 02/27/2017  PT End of Session - 02/27/17 1221    Visit Number  17    Number of Visits  26    Date for PT Re-Evaluation  03/19/17    Authorization Type  Medicare Recertification every 60 days    PT Start Time  0845    PT Stop Time  0930    PT Time Calculation (min)  45 min    Equipment Utilized During Treatment  Gait belt    Activity Tolerance  Patient tolerated treatment well    Behavior During Therapy  [facility] for tasks assessed/performed       Past Medical History:  Diagnosis Date  . Acute venous embolism and thrombosis of deep vessels of proximal lower extremity ([place]) 07/19/2011  . Anemia   . Chest pain, neg MI, normal coronaries by cath 02/18/2013  . CKD (chronic kidney disease) stage 3, GFR 30-59 ml/min (HCC) 02/19/2013  . Colon polyps   . Diabetic ulcer of right foot ([place])   . DVT (deep venous thrombosis) ([place]) 09/2002   patient reports additional DVTs in '06 & '11 (unconfirmed)  . GERD (gastroesophageal reflux disease)   . History of blood transfusion    "related to OR" (10/31/2016)  . Hyperlipidemia 02/19/2013  . Hypertension   . Nausea & vomiting    "constant for the last couple weeks" (10/31/2016)  . Nephrotic syndrome   . Obesity    BMI 44, weight 346 pounds 01/30/14  . Pulmonary embolism ([place]) 09/2002   treated with 6 months of warfarin  . Type I diabetes mellitus ([place]) dx'd 2001    Past Surgical History:  Procedure Laterality Date  . AMPUTATION Right 12/22/2013   Procedure: AMPUTATION BELOW KNEE;  Surgeon: [name], MD;  Location: [place];  Service: Orthopedics;  Laterality: Right;  . AMPUTATION Right 02/19/2014   Procedure: RIGHT ABOVE KNEE  AMPUTATION ;  Surgeon: [name], MD;  Location: [place];  Service: Orthopedics;  Laterality: Right;  . CARDIAC CATHETERIZATION  02/18/2013   normal coronaries  . ESOPHAGOGASTRODUODENOSCOPY N/A 11/02/2016   Procedure: ESOPHAGOGASTRODUODENOSCOPY (EGD);  Surgeon: [name], MD;  Location: [facility] ENDOSCOPY;  Service: Endoscopy;  Laterality: N/A;  . I&D EXTREMITY Right 12/14/2013   Procedure: IRRIGATION AND DEBRIDEMENT RIGHT FOOT;  Surgeon: [name], MD;  Location: [facility];  Service: Orthopedics;  Laterality: Right;  . INCISION AND DRAINAGE ABSCESS  2007; 2015   "back"  . INCISION AND DRAINAGE OF WOUND Right 12/22/2013   Procedure: I&D RIGHT BUTTOCK;  Surgeon: [name], MD;  Location: [facility];  Service: Orthopedics;  Laterality: Right;  . LEFT HEART CATHETERIZATION WITH CORONARY ANGIOGRAM N/A 02/18/2013   Procedure: LEFT HEART CATHETERIZATION WITH CORONARY ANGIOGRAM;  Surgeon: [name], MD;  Location[facility] C[facility] LAB;  Service: Cardiovascular;  Laterality: N/A;    There were no vitals filed for this visit.  Subjective Assessment - 02/27/17 0845    Subjective  Has appointment with prosthetist this afternoon.     Pertinent History  Right TFA, Type 1 DM, HTN, chest pain, DVT/PE, nephrotic syndrome, morbid obesity, LBP    Patient Stated Goals  To walk with prosthesis in community and stand to perform ADLs at home.  Currently in Pain?  No/denies      Prosthetic Training with Transfemoral prosthesis. Pt donned liner & is was loose due to limb volume changes. Then donned prosthesis tightening suspension strap.  Patient sit to stand required 3 attempts to RW for stabilization. Stand to sit controls only with verbal cues.  Pt ambulated 40' with RW with supervision then liner / prosthesis slid down on limb to point risky that could slide off completely. Pt had to sit to redonne prosthesis.  PT instructed pt in use of dual suspension with belt as 2nd due to weight of his prosthesis pulling  during swing. Limb volume change causing liner & socket to be too big and weight of prosthesis is causing slippage.  Pt neg ramp & curb with RW with supervision with verbal cues on technique.                          PT Short Term Goals - 02/22/17 1434      PT SHORT TERM GOAL #1   Title  Patient verbalizes working with prosthetist on socket revision. (All STGs Target Date: 02/16/2017)    Baseline  MET 02/15/2017    Time  4    Period  Weeks    Status  Achieved      PT SHORT TERM GOAL #2   Title  Patient tolerates prosthesis wear daily >8 hrs total /day with limb pain or skin issues.     Baseline  MET 02/15/2017    Time  4    Period  Weeks    Status  Achieved      PT SHORT TERM GOAL #3   Title  Patient reaches towards floor within 10" with RW support with supervision.     Baseline  MET 02/15/2017    Time  4    Period  Weeks    Status  Achieved      PT SHORT TERM GOAL #4   Title  Patient ambulates 70' with RW & prosthesis with supervision.    Baseline  MET 02/15/2017    Time  4    Period  Weeks    Status  Achieved      PT SHORT TERM GOAL #5   Title  Patient negotiates ramps & curbs with RW & prosthesis with supervision.     Baseline  MET 02/22/2017    Time  4    Period  Weeks    Status  Achieved        PT Long Term Goals - 01/18/17 2113      PT LONG TERM GOAL #1   Title  Patient verbalizes & demonstrates proper prosthetic care to enable safe use of prosthesis. (Target Date: 03/16/2017)    Time  2    Period  Months    Status  On-going    Target Date  03/16/17      PT LONG TERM GOAL #2   Title  Patient tolerates daily wear of prosthesis >60% of awake hours without skin issues or limb pain to enable function throughout his day.    Time  2    Period  Months    Status  On-going    Target Date  03/16/17      PT LONG TERM GOAL #3   Title  Patient performs standing balance activities with rolling walker support & prosthesis reaching 10" anteriorly & to  floor, manage clothes modified independent.        Time  2    Period  Months    Status  On-going    Target Date  03/16/17      PT LONG TERM GOAL #4   Title  Patient ambulates 100' with RW & prosthesis modified independent to enable basic community access.     Time  2    Period  Months    Status  On-going    Target Date  03/16/17      PT LONG TERM GOAL #5   Title  Patient negotiates ramp, curb with RW & stairs with 2 rails with transfemoral prosthesis modified independent to enable community access.      Time  2    Period  Months    Status  On-going    Target Date  03/16/17            Plan - 02/27/17 1221    Clinical Impression Statement  Patient appears would benefit from a secondary suspension for dual support like a selesian or TES belt that he could remove some times. But the weight of prosthesis causes slippage of liner/ prosthesis with gait.     Rehab Potential  Good    Clinical Impairments Affecting Rehab Potential  Morbid obesity, length of time with immobility / w/c bound, lives alone.     PT Frequency  2x / week    PT Duration  8 weeks 16 weeks    PT Treatment/Interventions  ADLs/Self Care Home Management;Gait training;DME Instruction;Stair training;Functional mobility training;Therapeutic activities;Therapeutic exercise;Balance training;Neuromuscular re-education;Patient/family education;Prosthetic Training;Manual techniques    PT Next Visit Plan  work towards LTGs, prosthetic gait with RW, stairs(2 rails), ramps & curbs & balance activities decreasing UE support.     Consulted and Agree with Plan of Care  Patient       Patient will benefit from skilled therapeutic intervention in order to improve the following deficits and impairments:  Abnormal gait, Decreased activity tolerance, Decreased balance, Decreased endurance, Decreased knowledge of use of DME, Decreased mobility, Decreased range of motion, Decreased strength, Postural dysfunction, Prosthetic Dependency,  Obesity, Pain  Visit Diagnosis: Other abnormalities of gait and mobility  Muscle weakness (generalized)  Unsteadiness on feet  Abnormal posture     Problem List Patient Active Problem List   Diagnosis Date Noted  . History of nausea and vomiting 10/31/2016  . Prosthesis adjustment 08/17/2016  . History of pulmonary embolus (PE) 06/09/2016  . Normocytic anemia 01/30/2014  . Constipation   . Above knee amputation of right lower extremity (HCC) 12/26/2013  . GERD (gastroesophageal reflux disease) 08/14/2013  . Hyperlipidemia 02/19/2013  . S/P cardiac cath, 02/18/13, normal coronaries 02/19/2013  . CKD (chronic kidney disease) stage 3, GFR 30-59 ml/min (HCC) 02/19/2013  . Nephrotic syndrome 02/12/2012  . Essential hypertension 07/24/2011  . Long term current use of anticoagulant therapy 07/19/2011  . Back pain 07/12/2011  . History of DVT (deep vein thrombosis) 07/28/2009  . PROTEINURIA, MILD 02/01/2007  . Morbid obesity (HCC) 10/26/2005  . DM (diabetes mellitus) type I uncontrolled, periph vascular disorder (HCC) 01/09/2002    WALDRON,ROBIN PT, DPT 02/28/2017, 12:25 PM   [facility] [address] [place], [place], [address] Phone: [phone]   Fax:  [phone]  Name: Rehaan Dula MRN: [id number] Date of Birth: 03/28/1975   

## 2017-03-01 ENCOUNTER — Encounter: Payer: Self-pay | Admitting: Physical Therapy

## 2017-03-01 ENCOUNTER — Ambulatory Visit: Payer: Medicare Other | Admitting: Physical Therapy

## 2017-03-01 DIAGNOSIS — R2689 Other abnormalities of gait and mobility: Secondary | ICD-10-CM | POA: Diagnosis not present

## 2017-03-01 DIAGNOSIS — R293 Abnormal posture: Secondary | ICD-10-CM

## 2017-03-01 DIAGNOSIS — M6281 Muscle weakness (generalized): Secondary | ICD-10-CM | POA: Diagnosis not present

## 2017-03-01 DIAGNOSIS — R2681 Unsteadiness on feet: Secondary | ICD-10-CM | POA: Diagnosis not present

## 2017-03-01 DIAGNOSIS — M25651 Stiffness of right hip, not elsewhere classified: Secondary | ICD-10-CM | POA: Diagnosis not present

## 2017-03-01 NOTE — Therapy (Signed)
[facility] [address] [place], [place], [address] Phone: [phone]   Fax:  [phone]  Physical Therapy Treatment  Patient Details  Name: [name] MRN: [id number] Date of Birth: [birth year] Referring Provider: Kalman Shan, DO   Encounter Date: 03/01/2017  PT End of Session - 03/01/17 1403    Visit Number  18    Number of Visits  26    Date for PT Re-Evaluation  03/19/17    Authorization Type  Medicare Recertification every 60 days    PT Start Time  0900    PT Stop Time  0930    PT Time Calculation (min)  30 min    Equipment Utilized During Treatment  Gait belt    Activity Tolerance  Patient tolerated treatment well    Behavior During Therapy  [facility] for tasks assessed/performed       Past Medical History:  Diagnosis Date  . Acute venous embolism and thrombosis of deep vessels of proximal lower extremity ([place]) 07/19/2011  . Anemia   . Chest pain, neg MI, normal coronaries by cath 02/18/2013  . CKD (chronic kidney disease) stage 3, GFR 30-59 ml/min (HCC) 02/19/2013  . Colon polyps   . Diabetic ulcer of right foot ([place])   . DVT (deep venous thrombosis) ([place]) 09/2002   patient reports additional DVTs in '06 & '11 (unconfirmed)  . GERD (gastroesophageal reflux disease)   . History of blood transfusion    "related to OR" (10/31/2016)  . Hyperlipidemia 02/19/2013  . Hypertension   . Nausea & vomiting    "constant for the last couple weeks" (10/31/2016)  . Nephrotic syndrome   . Obesity    BMI 44, weight 346 pounds 01/30/14  . Pulmonary embolism ([place]) 09/2002   treated with 6 months of warfarin  . Type I diabetes mellitus ([place]) dx'd 2001    Past Surgical History:  Procedure Laterality Date  . AMPUTATION Right 12/22/2013   Procedure: AMPUTATION BELOW KNEE;  Surgeon: [name], MD;  Location: [place];  Service: Orthopedics;  Laterality: Right;  . AMPUTATION Right 02/19/2014   Procedure: RIGHT ABOVE KNEE  AMPUTATION ;  Surgeon: [name], MD;  Location: [place];  Service: Orthopedics;  Laterality: Right;  . CARDIAC CATHETERIZATION  02/18/2013   normal coronaries  . ESOPHAGOGASTRODUODENOSCOPY N/A 11/02/2016   Procedure: ESOPHAGOGASTRODUODENOSCOPY (EGD);  Surgeon: [name], MD;  [facility] ENDOSCOPY;  Service: Endoscopy;  Laterality: N/A;  . I&D EXTREMITY Right 12/14/2013   Procedure: IRRIGATION AND DEBRIDEMENT RIGHT FOOT;  Surgeon: [name], MD;  Location: [place];  Service: Orthopedics;  Laterality: Right;  . INCISION AND DRAINAGE ABSCESS  2007; 2015   "back"  . INCISION AND DRAINAGE OF WOUND Right 12/22/2013   Procedure: I&D RIGHT BUTTOCK;  Surgeon: [name], MD;  Location: [facility];  Service: Orthopedics;  Laterality: Right;  . LEFT HEART CATHETERIZATION WITH CORONARY ANGIOGRAM N/A 02/18/2013   Procedure: LEFT HEART CATHETERIZATION WITH CORONARY ANGIOGRAM;  Surgeon: [name], MD;  Location: [facility] CATH LAB;  Service: Cardiovascular;  Laterality: N/A;    There were no vitals filed for this visit.  Subjective Assessment - 03/01/17 0900    Subjective  He saw prosthetist Tuesday after PT. He tried liner & check socket. He is reducing volume by 7% and shipping both back to [place]. Prosthetist did not feel that secondary suspension would work with weight loss he is working.      Pertinent History  Right TFA,  Type 1 DM, HTN, chest pain, DVT/PE, nephrotic syndrome, morbid obesity, LBP    Patient Stated Goals  To walk with prosthesis in community and stand to perform ADLs at home.     Currently in Pain?  No/denies      Prosthetic Training with TFA prosthesis  Patient arrived 45 min to PT appointment due to transportation. PT requested that he donne prosthesis prior to appointment time so session could start quicker. When PT went to lobby patient was attempting to donne prosthesis and reported he needed to toilet. Patient spent 15 minutes in bathroom so PT session started late.  Patient's  limb would not fully seat in socket initially. PT advised against pulling hard on suspension strap so does not initiate slippage of liner. PT instructed that need 2" or more of velcro overlap prior to standing to make sure prosthesis does not come off. Posterior socket was at edge of w/c seat and patient reports difficulty with controlling / maintaining on limb if stands with this location. PT also attempted to seat limb deeper in socket by placing prosthesis in chair against wall and pushing into limb. This modification also did not work. PT applied hand sanitizer as lubricant to liner which did improve ability to seat limb deeper into socket.  Sit to stand takes increased time & multiple attempts w/c to RW. Stand to sit with verbal cues to control otherwise patient sits without control. Standing with posterior support on counter & RW anterior for intermittent UE support for 2 minutes. Forward pass standing in this position for 30 sec with supervision.                          PT Short Term Goals - 02/22/17 1434      PT SHORT TERM GOAL #1   Title  Patient verbalizes working with prosthetist on socket revision. (All STGs Target Date: 02/16/2017)    Baseline  MET 02/15/2017    Time  4    Period  Weeks    Status  Achieved      PT SHORT TERM GOAL #2   Title  Patient tolerates prosthesis wear daily >8 hrs total /day with limb pain or skin issues.     Baseline  MET 02/15/2017    Time  4    Period  Weeks    Status  Achieved      PT SHORT TERM GOAL #3   Title  Patient reaches towards floor within 10" with RW support with supervision.     Baseline  MET 02/15/2017    Time  4    Period  Weeks    Status  Achieved      PT SHORT TERM GOAL #4   Title  Patient ambulates 29' with RW & prosthesis with supervision.    Baseline  MET 02/15/2017    Time  4    Period  Weeks    Status  Achieved      PT SHORT TERM GOAL #5   Title  Patient negotiates ramps & curbs with RW & prosthesis with  supervision.     Baseline  MET 02/22/2017    Time  4    Period  Weeks    Status  Achieved        PT Long Term Goals - 01/18/17 2113      PT LONG TERM GOAL #1   Title  Patient verbalizes & demonstrates proper prosthetic care to enable safe  use of prosthesis. (Target Date: 03/16/2017)    Time  2    Period  Months    Status  On-going    Target Date  03/16/17      PT LONG TERM GOAL #2   Title  Patient tolerates daily wear of prosthesis >60% of awake hours without skin issues or limb pain to enable function throughout his day.    Time  2    Period  Months    Status  On-going    Target Date  03/16/17      PT LONG TERM GOAL #3   Title  Patient performs standing balance activities with rolling walker support & prosthesis reaching 10" anteriorly & to floor, manage clothes modified independent.      Time  2    Period  Months    Status  On-going    Target Date  03/16/17      PT LONG TERM GOAL #4   Title  Patient ambulates 100' with RW & prosthesis modified independent to enable basic community access.     Time  2    Period  Months    Status  On-going    Target Date  03/16/17      PT LONG TERM GOAL #5   Title  Patient negotiates ramp, curb with RW & stairs with 2 rails with transfemoral prosthesis modified independent to enable community access.      Time  2    Period  Months    Status  On-going    Target Date  03/16/17            Plan - 03/01/17 1403    Clinical Impression Statement  Patient had increased difficulty getting his limb to seat into socket today. He said prosthetist did not add pads and he did not eat salty foods. PT advised to stand to seat limb in socket compared to pulling hard on suspension strap as it may pull liner off limb some to intiiate slippage.     Rehab Potential  Good    Clinical Impairments Affecting Rehab Potential  Morbid obesity, length of time with immobility / w/c bound, lives alone.     PT Frequency  2x / week    PT Duration  8 weeks 16  weeks    PT Treatment/Interventions  ADLs/Self Care Home Management;Gait training;DME Instruction;Stair training;Functional mobility training;Therapeutic activities;Therapeutic exercise;Balance training;Neuromuscular re-education;Patient/family education;Prosthetic Training;Manual techniques    PT Next Visit Plan  work towards [place], prosthetic gait with RW, stairs(2 rails), ramps & curbs & balance activities decreasing UE support.     Consulted and Agree with Plan of Care  Patient       Patient will benefit from skilled therapeutic intervention in order to improve the following deficits and impairments:  Abnormal gait, Decreased activity tolerance, Decreased balance, Decreased endurance, Decreased knowledge of use of DME, Decreased mobility, Decreased range of motion, Decreased strength, Postural dysfunction, Prosthetic Dependency, Obesity, Pain  Visit Diagnosis: Other abnormalities of gait and mobility  Muscle weakness (generalized)  Unsteadiness on feet  Abnormal posture     Problem List Patient Active Problem List   Diagnosis Date Noted  . History of nausea and vomiting 10/31/2016  . Prosthesis adjustment 08/17/2016  . History of pulmonary embolus (PE) 06/09/2016  . Normocytic anemia 01/30/2014  . Constipation   . Above knee amputation of right lower extremity ([place]) 12/26/2013  . GERD (gastroesophageal reflux disease) 08/14/2013  . Hyperlipidemia 02/19/2013  . S/P cardiac cath, 02/18/13,  normal coronaries 02/19/2013  . CKD (chronic kidney disease) stage 3, GFR 30-59 ml/min (HCC) 02/19/2013  . Nephrotic syndrome 02/12/2012  . Essential hypertension 07/24/2011  . Long term current use of anticoagulant therapy 07/19/2011  . Back pain 07/12/2011  . History of DVT (deep vein thrombosis) 07/28/2009  . PROTEINURIA, MILD 02/01/2007  . Morbid obesity ([place]) 10/26/2005  . DM (diabetes mellitus) type I uncontrolled, periph vascular disorder ([place]) 01/09/2002    Lalaine Overstreet PT,  DPT 03/01/2017, 2:07 PM  [facility] [address] [place], [place], [address] Phone: [phone]   Fax:  [phone]  Name: [name] MRN: [id number] Date of Birth: 1975/03/10

## 2017-03-05 ENCOUNTER — Encounter: Payer: Self-pay | Admitting: Physical Therapy

## 2017-03-05 ENCOUNTER — Ambulatory Visit: Payer: Medicare Other | Admitting: Physical Therapy

## 2017-03-05 DIAGNOSIS — R293 Abnormal posture: Secondary | ICD-10-CM | POA: Diagnosis not present

## 2017-03-05 DIAGNOSIS — M6281 Muscle weakness (generalized): Secondary | ICD-10-CM | POA: Diagnosis not present

## 2017-03-05 DIAGNOSIS — M25651 Stiffness of right hip, not elsewhere classified: Secondary | ICD-10-CM | POA: Diagnosis not present

## 2017-03-05 DIAGNOSIS — R2689 Other abnormalities of gait and mobility: Secondary | ICD-10-CM | POA: Diagnosis not present

## 2017-03-05 DIAGNOSIS — R2681 Unsteadiness on feet: Secondary | ICD-10-CM

## 2017-03-05 NOTE — Therapy (Signed)
[facility] [address] [place], [place], [address] Phone: [phone]   Fax:  [phone]  Physical Therapy Treatment  Patient Details  Name: [name] MRN: [id number] Date of Birth: March 21, 1975 Referring Provider: Kalman Shan, DO   Encounter Date: 03/05/2017  PT End of Session - 03/05/17 1011    Visit Number  19    Number of Visits  26    Date for PT Re-Evaluation  03/19/17    Authorization Type  Medicare Recertification every 60 days    PT Start Time  0845    PT Stop Time  0930    PT Time Calculation (min)  45 min    Equipment Utilized During Treatment  Gait belt    Activity Tolerance  Patient tolerated treatment well    Behavior During Therapy  [facility] for tasks assessed/performed       Past Medical History:  Diagnosis Date  . Acute venous embolism and thrombosis of deep vessels of proximal lower extremity ([place]) 07/19/2011  . Anemia   . Chest pain, neg MI, normal coronaries by cath 02/18/2013  . CKD (chronic kidney disease) stage 3, GFR 30-59 ml/min (HCC) 02/19/2013  . Colon polyps   . Diabetic ulcer of right foot ([place])   . DVT (deep venous thrombosis) ([place]) 09/2002   patient reports additional DVTs in '06 & '11 (unconfirmed)  . GERD (gastroesophageal reflux disease)   . History of blood transfusion    "related to OR" (10/31/2016)  . Hyperlipidemia 02/19/2013  . Hypertension   . Nausea & vomiting    "constant for the last couple weeks" (10/31/2016)  . Nephrotic syndrome   . Obesity    BMI 44, weight 346 pounds 01/30/14  . Pulmonary embolism ([place]) 09/2002   treated with 6 months of warfarin  . Type I diabetes mellitus ([place]) dx'd 2001    Past Surgical History:  Procedure Laterality Date  . AMPUTATION Right 12/22/2013   Procedure: AMPUTATION BELOW KNEE;  Surgeon: [name], MD;  Location: [facility];  Service: Orthopedics;  Laterality: Right;  . AMPUTATION Right 02/19/2014   Procedure: RIGHT ABOVE KNEE  AMPUTATION ;  Surgeon: [name], MD;  Location: [facility];  Service: Orthopedics;  Laterality: Right;  . CARDIAC CATHETERIZATION  02/18/2013   normal coronaries  . ESOPHAGOGASTRODUODENOSCOPY N/A 11/02/2016   Procedure: ESOPHAGOGASTRODUODENOSCOPY (EGD);  Surgeon: [name], MD;  Location: [facility] ENDOSCOPY;  Service: Endoscopy;  Laterality: N/A;  . I&D EXTREMITY Right 12/14/2013   Procedure: IRRIGATION AND DEBRIDEMENT RIGHT FOOT;  Surgeon: [name], MD;  Location: [facility];  Service: Orthopedics;  Laterality: Right;  . INCISION AND DRAINAGE ABSCESS  2007; 2015   "back"  . INCISION AND DRAINAGE OF WOUND Right 12/22/2013   Procedure: I&D RIGHT BUTTOCK;  Surgeon: [name], MD;  Location: [facility];  Service: Orthopedics;  Laterality: Right;  . LEFT HEART CATHETERIZATION WITH CORONARY ANGIOGRAM N/A 02/18/2013   Procedure: LEFT HEART CATHETERIZATION WITH CORONARY ANGIOGRAM;  Surgeon: [name], MD;  Location: [facility] CATH LAB;  Service: Cardiovascular;  Laterality: N/A;    There were no vitals filed for this visit.  Subjective Assessment - 03/05/17 0845    Subjective  He is still struggling with getting prosthesis on his limb. He continues to wear daily.     Pertinent History  Right TFA, Type 1 DM, HTN, chest pain, DVT/PE, nephrotic syndrome, morbid obesity, LBP    Patient Stated Goals  To walk with prosthesis in community  and stand to perform ADLs at home.     Currently in Pain?  No/denies       Prosthetic Training with TFA prosthesis Patient donned prosthesis with partial suspension strap attachment in lobby prior to appt time as PT recommended. PT instructed & pt return demo Standing with weight on prosthesis to tighten suspension strap.  Patient sit to/from stand using UEs multiple attempts to parallel bars. Initially attempted stand to RW but patient did not "trust" prosthesis today.  Standing with posterior pelvis touching parallel bar for stability & 2nd bar anterior for intermittent  touch. Pt tol standing 5 min X 2 and forward pass exercise [id number] sec first stand and 2 min 2nd stand with supervision. Patient ambulated 20' and negotiated ramp & curb with RW with supervision.                      PT Education - 03/05/17 0920    Education provided  Yes    Education Details  PNF sidelying UE anterior elevation & LE posterior depression stretch    Person(s) Educated  Patient    Methods  Explanation;Demonstration;Tactile cues;Verbal cues    Comprehension  Verbalized understanding;Returned demonstration;Verbal cues required;Tactile cues required;Need further instruction       PT Short Term Goals - 02/22/17 1434      PT SHORT TERM GOAL #1   Title  Patient verbalizes working with prosthetist on socket revision. (All STGs Target Date: 02/16/2017)    Baseline  MET 02/15/2017    Time  4    Period  Weeks    Status  Achieved      PT SHORT TERM GOAL #2   Title  Patient tolerates prosthesis wear daily >8 hrs total /day with limb pain or skin issues.     Baseline  MET 02/15/2017    Time  4    Period  Weeks    Status  Achieved      PT SHORT TERM GOAL #3   Title  Patient reaches towards floor within 10" with RW support with supervision.     Baseline  MET 02/15/2017    Time  4    Period  Weeks    Status  Achieved      PT SHORT TERM GOAL #4   Title  Patient ambulates 725 with RW & prosthesis with supervision.    Baseline  MET 02/15/2017    Time  4    Period  Weeks    Status  Achieved      PT SHORT TERM GOAL #5   Title  Patient negotiates ramps & curbs with RW & prosthesis with supervision.     Baseline  MET 02/22/2017    Time  4    Period  Weeks    Status  Achieved        PT Long Term Goals - 01/18/17 2113      PT LONG TERM GOAL #1   Title  Patient verbalizes & demonstrates proper prosthetic care to enable safe use of prosthesis. (Target Date: 03/16/2017)    Time  2    Period  Months    Status  On-going    Target Date  03/16/17      PT LONG TERM  GOAL #2   Title  Patient tolerates daily wear of prosthesis >60% of awake hours without skin issues or limb pain to enable function throughout his day.    Time  2    Period  Months    Status  On-going    Target Date  03/16/17      PT LONG TERM GOAL #3   Title  Patient performs standing balance activities with rolling walker support & prosthesis reaching 10" anteriorly & to floor, manage clothes modified independent.      Time  2    Period  Months    Status  On-going    Target Date  03/16/17      PT LONG TERM GOAL #4   Title  Patient ambulates 100' with RW & prosthesis modified independent to enable basic community access.     Time  2    Period  Months    Status  On-going    Target Date  03/16/17      PT LONG TERM GOAL #5   Title  Patient negotiates ramp, curb with RW & stairs with 2 rails with transfemoral prosthesis modified independent to enable community access.      Time  2    Period  Months    Status  On-going    Target Date  03/16/17            Plan - 03/05/17 1012    Clinical Impression Statement  Patient appears to have improved understanding of proper technique to tighten strap.  He improved negotiating ramps & curbs and appears on target to meet LTGs next week.     Rehab Potential  Good    Clinical Impairments Affecting Rehab Potential  Morbid obesity, length of time with immobility / w/c bound, lives alone.     PT Frequency  2x / week    PT Duration  8 weeks 16 weeks    PT Treatment/Interventions  ADLs/Self Care Home Management;Gait training;DME Instruction;Stair training;Functional mobility training;Therapeutic activities;Therapeutic exercise;Balance training;Neuromuscular re-education;Patient/family education;Prosthetic Training;Manual techniques    PT Next Visit Plan  work towards [place], prosthetic gait with RW, stairs(2 rails), ramps & curbs & balance activities decreasing UE support.     Consulted and Agree with Plan of Care  Patient       Patient will  benefit from skilled therapeutic intervention in order to improve the following deficits and impairments:  Abnormal gait, Decreased activity tolerance, Decreased balance, Decreased endurance, Decreased knowledge of use of DME, Decreased mobility, Decreased range of motion, Decreased strength, Postural dysfunction, Prosthetic Dependency, Obesity, Pain  Visit Diagnosis: Other abnormalities of gait and mobility  Muscle weakness (generalized)  Unsteadiness on feet  Abnormal posture  Stiffness of right hip, not elsewhere classified     Problem List Patient Active Problem List   Diagnosis Date Noted  . History of nausea and vomiting 10/31/2016  . Prosthesis adjustment 08/17/2016  . History of pulmonary embolus (PE) 06/09/2016  . Normocytic anemia 01/30/2014  . Constipation   . Above knee amputation of right lower extremity ([place]) 12/26/2013  . GERD (gastroesophageal reflux disease) 08/14/2013  . Hyperlipidemia 02/19/2013  . S/P cardiac cath, 02/18/13, normal coronaries 02/19/2013  . CKD (chronic kidney disease) stage 3, GFR 30-59 ml/min (HCC) 02/19/2013  . Nephrotic syndrome 02/12/2012  . Essential hypertension 07/24/2011  . Long term current use of anticoagulant therapy 07/19/2011  . Back pain 07/12/2011  . History of DVT (deep vein thrombosis) 07/28/2009  . PROTEINURIA, MILD 02/01/2007  . Morbid obesity ([place]) 10/26/2005  . DM (diabetes mellitus) type I uncontrolled, periph vascular disorder ([place]) 01/09/2002    Kimbrely Buckel PT, DPT 03/05/2017, 10:35 AM  [facility] [address]  [place], [place], [address] Phone: [phone]   Fax:  [phone]  Name: Octavis Sheeler MRN: [id number] Date of Birth: 1975-02-29

## 2017-03-06 ENCOUNTER — Encounter: Payer: Self-pay | Admitting: Internal Medicine

## 2017-03-06 ENCOUNTER — Ambulatory Visit (INDEPENDENT_AMBULATORY_CARE_PROVIDER_SITE_OTHER): Payer: Medicare Other | Admitting: Internal Medicine

## 2017-03-06 ENCOUNTER — Other Ambulatory Visit: Payer: Self-pay

## 2017-03-06 DIAGNOSIS — N183 Chronic kidney disease, stage 3 (moderate): Secondary | ICD-10-CM | POA: Diagnosis not present

## 2017-03-06 DIAGNOSIS — I10 Essential (primary) hypertension: Secondary | ICD-10-CM

## 2017-03-06 DIAGNOSIS — Z6841 Body Mass Index (BMI) 40.0 and over, adult: Secondary | ICD-10-CM | POA: Diagnosis not present

## 2017-03-06 DIAGNOSIS — Z794 Long term (current) use of insulin: Secondary | ICD-10-CM | POA: Diagnosis not present

## 2017-03-06 DIAGNOSIS — Z89611 Acquired absence of right leg above knee: Secondary | ICD-10-CM | POA: Diagnosis not present

## 2017-03-06 DIAGNOSIS — Z7901 Long term (current) use of anticoagulants: Secondary | ICD-10-CM

## 2017-03-06 DIAGNOSIS — I129 Hypertensive chronic kidney disease with stage 1 through stage 4 chronic kidney disease, or unspecified chronic kidney disease: Secondary | ICD-10-CM | POA: Diagnosis not present

## 2017-03-06 DIAGNOSIS — Z86718 Personal history of other venous thrombosis and embolism: Secondary | ICD-10-CM | POA: Diagnosis not present

## 2017-03-06 DIAGNOSIS — K219 Gastro-esophageal reflux disease without esophagitis: Secondary | ICD-10-CM | POA: Diagnosis not present

## 2017-03-06 DIAGNOSIS — K5900 Constipation, unspecified: Secondary | ICD-10-CM

## 2017-03-06 DIAGNOSIS — K921 Melena: Secondary | ICD-10-CM

## 2017-03-06 DIAGNOSIS — E1122 Type 2 diabetes mellitus with diabetic chronic kidney disease: Secondary | ICD-10-CM | POA: Diagnosis not present

## 2017-03-06 DIAGNOSIS — Z79899 Other long term (current) drug therapy: Secondary | ICD-10-CM | POA: Diagnosis not present

## 2017-03-06 NOTE — Patient Instructions (Addendum)
Thank you for coming to the clinic today. It was a pleasure to see you.   For your constipation please pick up the senokot and use this whenever you have gone two days without having a bowel movement. Call the clinic and let us know if you begin having diarrhea, dark stools, or another episode of the bright red blood.   FOLLOW-UP INSTRUCTIONS When: 4/19 with Dr. Heber [place]  For: general check up   What to bring: all of your medication bottles   Please call our clinic if you have any questions or concerns, we may be able to help and keep you from a long and expensive emergency room wait. Our clinic and after hours phone number is [phone], there is always someone available.    Constipation, Adult Constipation is when a person:  Poops (has a bowel movement) fewer times in a week than normal.  Has a hard time pooping.  Has poop that is dry, hard, or bigger than normal.  Follow these instructions at home: Eating and drinking   Eat foods that have a lot of fiber, such as: ? Fresh fruits and vegetables. ? Whole grains. ? Beans.  Eat less of foods that are high in fat, low in fiber, or overly processed, such as: ? [place] fries. ? Hamburgers. ? Cookies. ? Candy. ? Soda.  Drink enough fluid to keep your pee (urine) clear or pale yellow. General instructions  Exercise regularly or as told by your doctor.  Go to the restroom when you feel like you need to poop. Do not hold it in.  Take over-the-counter and prescription medicines only as told by your doctor. These include any fiber supplements.  Do pelvic floor retraining exercises, such as: ? Doing deep breathing while relaxing your lower belly (abdomen). ? Relaxing your pelvic floor while pooping.  Watch your condition for any changes.  Keep all follow-up visits as told by your doctor. This is important. Contact a doctor if:  You have pain that gets worse.  You have a fever.  You have not pooped for 4 days.  You  throw up (vomit).  You are not hungry.  You lose weight.  You are bleeding from the anus.  You have thin, pencil-like poop (stool). Get help right away if:  You have a fever, and your symptoms suddenly get worse.  You leak poop or have blood in your poop.  Your belly feels hard or bigger than normal (is bloated).  You have very bad belly pain.  You feel dizzy or you faint. This information is not intended to replace advice given to you by your health care provider. Make sure you discuss any questions you have with your health care provider. Document Released: 06/14/2007 Document Revised: 07/16/2015 Document Reviewed: 06/16/2015 Elsevier Interactive Patient Education  2018 [employer].

## 2017-03-06 NOTE — Progress Notes (Signed)
CC: blood in stool   HPI:  [name] is a 42 y.o. with PMH controlled diabetes on insulin therapy complicated by right AKA, GERD, hypertension, constipation, CKD stage III, history of unprovoked venous thromboembolism, morbid obesity who presents for follow up/ acute concern of one episode of blood in his stool. Please see the assessment and plans for the status of the patient chronic medical problems.   Past Medical History:  Diagnosis Date  . Acute venous embolism and thrombosis of deep vessels of proximal lower extremity ([place]) 07/19/2011  . Anemia   . Chest pain, neg MI, normal coronaries by cath 02/18/2013  . CKD (chronic kidney disease) stage 3, GFR 30-59 ml/min (HCC) 02/19/2013  . Colon polyps   . Diabetic ulcer of right foot ([place])   . DVT (deep venous thrombosis) ([place]) 09/2002   patient reports additional DVTs in '06 & '11 (unconfirmed)  . GERD (gastroesophageal reflux disease)   . History of blood transfusion    "related to OR" (10/31/2016)  . Hyperlipidemia 02/19/2013  . Hypertension   . Nausea & vomiting    "constant for the last couple weeks" (10/31/2016)  . Nephrotic syndrome   . Obesity    BMI 44, weight 346 pounds 01/30/14  . Pulmonary embolism ([place]) 09/2002   treated with 6 months of warfarin  . Type I diabetes mellitus ([place]) dx'd 2001   Review of Systems: Refer to history of present illness and assessment and plans for pertinent review of systems, all others reviewed and negative  Physical Exam:  Vitals:   03/06/17 1410  BP: (!) 156/79  Pulse: 79  Temp: (!) 97.5 F (36.4 C)  TempSrc: Oral  SpO2: 100%  Height: 6\' 2"  (1.88 m)   General: morbidly obese, well appearing, no acute distress  Cardiac: regular rate and rhythm  Pulm: lungs clear to auscultation  Gastrointestinal: soft, non tender, non distended, no palpable masses  MSK: AKA of the right leg   Assessment & Plan:   Blood in stool  Four days ago he had a one time episode a non painful  bloody bowel movement. The bowel movement was well formed stool, this was in the setting of a difficult to pass bowel movement. Hes had no further bowel movements since Saturday. Hes never had problems with blood in his stool in the past. He has no abdominal pain or nausea today. Recently seen in clinic in early February with reports of constipation, he was prescribed Senokot to be used as needed. He has not had the chance to pick up this medication yet. He is on apixiban long-term for a history of unprovoked DVT and PE in 2005 along with a DVT in the setting of BKA in 2016. Hes unsure of the cause of the constipation but denies unintentional weight loss, decreased appetite or night sweats. He has no history of colon cancer in his family. Father was diagnosed with prostate cancer at age 35.  The history of painless bright red blood per rectum in the setting of constipation is most likely due to internal hemorrhoids.  - encouraged to pick up senokot-s and miralax use this as needed for constipation - return to clinic if he has reoccurrence of the bloody bowel movement or melena, lightheadedness, dizziness, or weakness  Hypertension Blood pressure was initially elevated but then improved on recheck. Blood pressure is well controlled on current antihypertensive combination.  - continue carvedilol 25mg  BID, lasix 80mg  BID, metolazone 2.5mg  every other day, clonidine 0.2mg  BID and  amlodipine 5mg  daily today.   See Encounters Tab for problem based charting.  Patient discussed with Dr. Evette Doffing

## 2017-03-07 DIAGNOSIS — K921 Melena: Secondary | ICD-10-CM | POA: Insufficient documentation

## 2017-03-07 NOTE — Assessment & Plan Note (Signed)
Blood pressure was initially elevated but then improved on recheck. Blood pressure is well controlled on current antihypertensive combination.  - continue carvedilol 25mg  BID, lasix 80mg  BID, metolazone 2.5mg  every other day, clonidine 0.2mg  BID and amlodipine 5mg  daily today.

## 2017-03-07 NOTE — Assessment & Plan Note (Signed)
Four days ago he had a one time episode a non painful bloody bowel movement. The bowel movement was well formed stool, this was in the setting of a difficult to pass bowel movement. Hes had no further bowel movements since Saturday. Hes never had problems with blood in his stool in the past. He has no abdominal pain or nausea today. Recently seen in clinic in early February with reports of constipation, he was prescribed Senokot to be used as needed. He has not had the chance to pick up this medication yet. He is on apixiban long-term for a history of unprovoked DVT and PE in 2005 along with a DVT in the setting of BKA in 2016. Hes unsure of the cause of the constipation but denies unintentional weight loss, decreased appetite or night sweats. He has no history of colon cancer in his family. Father was diagnosed with prostate cancer at age 39.  The history of painless bright red blood per rectum in the setting of constipation is most likely due to internal hemorrhoids.  - encouraged to pick up senokot-s and miralax use this as needed for constipation - return to clinic if he has reoccurrence of the bloody bowel movement or melena, lightheadedness, dizziness, or weakness

## 2017-03-08 ENCOUNTER — Ambulatory Visit: Payer: Medicare Other | Admitting: Physical Therapy

## 2017-03-08 ENCOUNTER — Encounter: Payer: Self-pay | Admitting: Physical Therapy

## 2017-03-08 DIAGNOSIS — M25651 Stiffness of right hip, not elsewhere classified: Secondary | ICD-10-CM | POA: Diagnosis not present

## 2017-03-08 DIAGNOSIS — R2681 Unsteadiness on feet: Secondary | ICD-10-CM | POA: Diagnosis not present

## 2017-03-08 DIAGNOSIS — R2689 Other abnormalities of gait and mobility: Secondary | ICD-10-CM | POA: Diagnosis not present

## 2017-03-08 DIAGNOSIS — M6281 Muscle weakness (generalized): Secondary | ICD-10-CM

## 2017-03-08 DIAGNOSIS — R293 Abnormal posture: Secondary | ICD-10-CM | POA: Diagnosis not present

## 2017-03-08 NOTE — Therapy (Signed)
[facility] [address] [place], [place], [address] Phone: [phone]   Fax:  [phone]  Physical Therapy Treatment  Patient Details  Name: [name] MRN: [id number] Date of Birth: 1975/09/24 Referring Provider: Kalman Shan, DO   Encounter Date: 03/08/2017  PT End of Session - 03/08/17 1734    Visit Number  20    Number of Visits  26    Date for PT Re-Evaluation  03/19/17    Authorization Type  Medicare Recertification every 60 days    PT Start Time  0845    PT Stop Time  0930    PT Time Calculation (min)  45 min    Equipment Utilized During Treatment  Gait belt    Activity Tolerance  Patient tolerated treatment well    Behavior During Therapy  [facility] for tasks assessed/performed       Past Medical History:  Diagnosis Date  . Acute venous embolism and thrombosis of deep vessels of proximal lower extremity ([place]) 07/19/2011  . Anemia   . Chest pain, neg MI, normal coronaries by cath 02/18/2013  . CKD (chronic kidney disease) stage 3, GFR 30-59 ml/min (HCC) 02/19/2013  . Colon polyps   . Diabetic ulcer of right foot ([place])   . DVT (deep venous thrombosis) ([place]) 09/2002   patient reports additional DVTs in '06 & '11 (unconfirmed)  . GERD (gastroesophageal reflux disease)   . History of blood transfusion    "related to OR" (10/31/2016)  . Hyperlipidemia 02/19/2013  . Hypertension   . Nausea & vomiting    "constant for the last couple weeks" (10/31/2016)  . Nephrotic syndrome   . Obesity    BMI 44, weight 346 pounds 01/30/14  . Pulmonary embolism ([place]) 09/2002   treated with 6 months of warfarin  . Type I diabetes mellitus ([place]) dx'd 2001    Past Surgical History:  Procedure Laterality Date  . AMPUTATION Right 12/22/2013   Procedure: AMPUTATION BELOW KNEE;  Surgeon: [name], MD;  Location: [place];  Service: Orthopedics;  Laterality: Right;  . AMPUTATION Right 02/19/2014   Procedure: RIGHT ABOVE KNEE  AMPUTATION ;  Surgeon: [name], MD;  Location: [place];  Service: Orthopedics;  Laterality: Right;  . CARDIAC CATHETERIZATION  02/18/2013   normal coronaries  . ESOPHAGOGASTRODUODENOSCOPY N/A 11/02/2016   Procedure: ESOPHAGOGASTRODUODENOSCOPY (EGD);  Surgeon: [name], MD;  Location: [facility] ENDOSCOPY;  Service: Endoscopy;  Laterality: N/A;  . I&D EXTREMITY Right 12/14/2013   Procedure: IRRIGATION AND DEBRIDEMENT RIGHT FOOT;  Surgeon: [name], MD;  Location: [place];  Service: Orthopedics;  Laterality: Right;  . INCISION AND DRAINAGE ABSCESS  2007; 2015   "back"  . INCISION AND DRAINAGE OF WOUND Right 12/22/2013   Procedure: I&D RIGHT BUTTOCK;  Surgeon: [name], MD;  Location: [place];  Service: Orthopedics;  Laterality: Right;  . LEFT HEART CATHETERIZATION WITH CORONARY ANGIOGRAM N/A 02/18/2013   Procedure: LEFT HEART CATHETERIZATION WITH CORONARY ANGIOGRAM;  Surgeon: [name], MD;  Location: [facility] CATH LAB;  Service: Cardiovascular;  Laterality: N/A;    There were no vitals filed for this visit.  Subjective Assessment - 03/08/17 0845    Subjective  No falls.     Pertinent History  Right TFA, Type 1 DM, HTN, chest pain, DVT/PE, nephrotic syndrome, morbid obesity, LBP    Patient Stated Goals  To walk with prosthesis in community and stand to perform ADLs at home.     Currently in  Pain?  No/denies      HEP  Abdominal Crunch (TFA)    Tighten stomach muscles to tilt pelvis and flatten back. Raise head and shoulders, and slide fingers up thigh toward knee. Continue breathing normally. Hold __5__ seconds. Repeat __15-20__ times.  Copyright  VHI. All rights reserved.   Hip Extension    With towel roll under residual limb, push down into towel roll while lifting buttocks. Hold __5__ seconds. Repeat __15-20__ times.   Copyright  VHI. All rights reserved.   Hip Extension    With towel roll under residual limb, hold sound limb up. Push down into towel roll while  lifting buttocks. Hold __5__ seconds. Repeat __10-15__ times.   Copyright  VHI. All rights reserved.   Bridging / Pelvic Rotation (TFA)    Lift sound hip up while allowing residual limb hip to hang. Then lift residual limb hip as high as possible. Hold __5__ seconds. Repeat _15-20___ times.   Copyright  VHI. All rights reserved.   Shoulder Retraction (TFA)    With arms out to sides, elbows on towel rolls, push down into towel rolls while lifting chest. Hold __5__ seconds. Repeat _15-20___ times. Copyright  VHI. All rights reserved.    Hip Abduction (TFA)    Lie on residual limb side with towel roll under thigh and sound limb supported on a stool. Push down into towel roll while attempting to lift hips. Attempt to keep residual limb in straight line with body. Hold __5__ seconds. Repeat __10-15__ times.  Copyright  VHI. All rights reserved.   Hip Abduction (TFA)    With towel roll under side of thigh and sound limb held up, push down into towel roll while lifting hips. Hold __5__ seconds. Repeat __15-20__ times.   Copyright  VHI. All rights reserved.   Hip Adduction (TFA)    With residual limb on a stool, and hip as straight as possible, squeeze down into stool while attempting to lift hips. Hold __5__ seconds. Repeat __10-15__ times.   Copyright  VHI. All rights reserved.   Hip Adduction (TFA)    With residual limb on stool and hip as straight as possible, lift sound limb slightly. Then push down into towel roll while lifting hips. Hold __5__ seconds. Repeat __15-20__ times.   Copyright  VHI. All rights reserved.   Hip Flexion    With towel roll under residual limb, push down into towel roll while lifting hips.  Hold ____ seconds. Relax and stretch between lifts. Repeat ____ times. Do ____ sessions per day.  Copyright  VHI. All rights reserved.    Push-Up (TFA)    On foot and hands, with back straight, lower chest within ___15_ inches of  surface and push up. Repeat __15-20__ times.  Copyright  VHI. All rights reserved.   Opposite Arm / Leg Lift (Prone)    Abdomen and head supported, left knee locked, raise leg and opposite arm _6___ inches from floor. Repeat __15-20__ times per set.   http://orth.exer.us/1114   Copyright  VHI. All rights reserved.                            PT Short Term Goals - 02/22/17 1434      PT SHORT TERM GOAL #1   Title  Patient verbalizes working with prosthetist on socket revision. (All STGs Target Date: 02/16/2017)    Baseline  MET 02/15/2017    Time  4    Period  Weeks    Status  Achieved      PT SHORT TERM GOAL #2   Title  Patient tolerates prosthesis wear Victor >8 hrs total /day with limb pain or skin issues.     Baseline  MET 02/15/2017    Time  4    Period  Weeks    Status  Achieved      PT SHORT TERM GOAL #3   Title  Patient reaches towards floor within 10" with RW support with supervision.     Baseline  MET 02/15/2017    Time  4    Period  Weeks    Status  Achieved      PT SHORT TERM GOAL #4   Title  Patient ambulates 79' with RW & prosthesis with supervision.    Baseline  MET 02/15/2017    Time  4    Period  Weeks    Status  Achieved      PT SHORT TERM GOAL #5   Title  Patient negotiates ramps & curbs with RW & prosthesis with supervision.     Baseline  MET 02/22/2017    Time  4    Period  Weeks    Status  Achieved        PT Long Term Goals - 01/18/17 2113      PT LONG TERM GOAL #1   Title  Patient verbalizes & demonstrates proper prosthetic care to enable safe use of prosthesis. (Target Date: 03/16/2017)    Time  2    Period  Months    Status  On-going    Target Date  03/16/17      PT LONG TERM GOAL #2   Title  Patient tolerates Victor wear of prosthesis >60% of awake hours without skin issues or limb pain to enable function throughout his day.    Time  2    Period  Months    Status  On-going    Target Date  03/16/17      PT LONG  TERM GOAL #3   Title  Patient performs standing balance activities with rolling walker support & prosthesis reaching 10" anteriorly & to floor, manage clothes modified independent.      Time  2    Period  Months    Status  On-going    Target Date  03/16/17      PT LONG TERM GOAL #4   Title  Patient ambulates 100' with RW & prosthesis modified independent to enable basic community access.     Time  2    Period  Months    Status  On-going    Target Date  03/16/17      PT LONG TERM GOAL #5   Title  Patient negotiates ramp, curb with RW & stairs with 2 rails with transfemoral prosthesis modified independent to enable community access.      Time  2    Period  Months    Status  On-going    Target Date  03/16/17            Plan - 03/08/17 1734    Clinical Impression Statement  Patient appears to understand updated HEP. He is on target to meet functional LTGs next week but will probably not meet prosthesis wear goal.    Rehab Potential  Good    Clinical Impairments Affecting Rehab Potential  Morbid obesity, length of time with immobility / w/c bound, lives alone.     PT Frequency  2x / week    PT Duration  8 weeks 16 weeks    PT Treatment/Interventions  ADLs/Self Care Home Management;Gait training;DME Instruction;Stair training;Functional mobility training;Therapeutic activities;Therapeutic exercise;Balance training;Neuromuscular re-education;Patient/family education;Prosthetic Training;Manual techniques    PT Next Visit Plan  begin to check LTGs.     Consulted and Agree with Plan of Care  Patient       Patient will benefit from skilled therapeutic intervention in order to improve the following deficits and impairments:  Abnormal gait, Decreased activity tolerance, Decreased balance, Decreased endurance, Decreased knowledge of use of DME, Decreased mobility, Decreased range of motion, Decreased strength, Postural dysfunction, Prosthetic Dependency, Obesity, Pain  Visit  Diagnosis: Muscle weakness (generalized)  Abnormal posture     Problem List Patient Active Problem List   Diagnosis Date Noted  . Blood in stool 03/07/2017  . History of nausea and vomiting 10/31/2016  . Prosthesis adjustment 08/17/2016  . History of pulmonary embolus (PE) 06/09/2016  . Normocytic anemia 01/30/2014  . Constipation   . Above knee amputation of right lower extremity ([place]) 12/26/2013  . GERD (gastroesophageal reflux disease) 08/14/2013  . Hyperlipidemia 02/19/2013  . S/P cardiac cath, 02/18/13, normal coronaries 02/19/2013  . CKD (chronic kidney disease) stage 3, GFR 30-59 ml/min (HCC) 02/19/2013  . Nephrotic syndrome 02/12/2012  . Hypertension 07/24/2011  . Long term current use of anticoagulant therapy 07/19/2011  . Back pain 07/12/2011  . History of DVT (deep vein thrombosis) 07/28/2009  . PROTEINURIA, MILD 02/01/2007  . Morbid obesity ([place]) 10/26/2005  . DM (diabetes mellitus) type I uncontrolled, periph vascular disorder ([place]) 01/09/2002    Leza Apsey PT, DPT 03/08/2017, 5:38 PM  [facility] [address] [place], [place], [address] Phone: [phone]   Fax:  [phone]  Name: [name] MRN: [id number] Date of Birth: [birth year]

## 2017-03-08 NOTE — Progress Notes (Signed)
Internal Medicine Clinic Attending  Case discussed with Dr. Blum at the time of the visit.  We reviewed the resident's history and exam and pertinent patient test results.  I agree with the assessment, diagnosis, and plan of care documented in the resident's note. 

## 2017-03-08 NOTE — Patient Instructions (Addendum)
Abdominal Crunch (TFA)    Tighten stomach muscles to tilt pelvis and flatten back. Raise head and shoulders, and slide fingers up thigh toward knee. Continue breathing normally. Hold __5__ seconds. Repeat __15-20__ times.  Copyright  VHI. All rights reserved.   Hip Extension    With towel roll under residual limb, push down into towel roll while lifting buttocks. Hold __5__ seconds. Repeat __15-20__ times.   Copyright  VHI. All rights reserved.   Hip Extension    With towel roll under residual limb, hold sound limb up. Push down into towel roll while lifting buttocks. Hold __5__ seconds. Repeat __10-15__ times.   Copyright  VHI. All rights reserved.   Bridging / Pelvic Rotation (TFA)    Lift sound hip up while allowing residual limb hip to hang. Then lift residual limb hip as high as possible. Hold __5__ seconds. Repeat _15-20___ times.   Copyright  VHI. All rights reserved.   Shoulder Retraction (TFA)    With arms out to sides, elbows on towel rolls, push down into towel rolls while lifting chest. Hold __5__ seconds. Repeat _15-20___ times. Copyright  VHI. All rights reserved.    Hip Abduction (TFA)    Lie on residual limb side with towel roll under thigh and sound limb supported on a stool. Push down into towel roll while attempting to lift hips. Attempt to keep residual limb in straight line with body. Hold __5__ seconds. Repeat __10-15__ times.  Copyright  VHI. All rights reserved.   Hip Abduction (TFA)    With towel roll under side of thigh and sound limb held up, push down into towel roll while lifting hips. Hold __5__ seconds. Repeat __15-20__ times.   Copyright  VHI. All rights reserved.   Hip Adduction (TFA)    With residual limb on a stool, and hip as straight as possible, squeeze down into stool while attempting to lift hips. Hold __5__ seconds. Repeat __10-15__ times.   Copyright  VHI. All rights reserved.   Hip Adduction  (TFA)    With residual limb on stool and hip as straight as possible, lift sound limb slightly. Then push down into towel roll while lifting hips. Hold __5__ seconds. Repeat __15-20__ times.   Copyright  VHI. All rights reserved.   Hip Flexion    With towel roll under residual limb, push down into towel roll while lifting hips.  Hold ____ seconds. Relax and stretch between lifts. Repeat ____ times. Do ____ sessions per day.  Copyright  VHI. All rights reserved.    Push-Up (TFA)    On foot and hands, with back straight, lower chest within ___15_ inches of surface and push up. Repeat __15-20__ times.  Copyright  VHI. All rights reserved.   Opposite Arm / Leg Lift (Prone)    Abdomen and head supported, left knee locked, raise leg and opposite arm _6___ inches from floor. Repeat __15-20__ times per set.   http://orth.exer.us/1114   Copyright  VHI. All rights reserved.

## 2017-03-13 ENCOUNTER — Ambulatory Visit: Payer: Medicare Other | Attending: Internal Medicine | Admitting: Physical Therapy

## 2017-03-13 ENCOUNTER — Encounter: Payer: Self-pay | Admitting: Physical Therapy

## 2017-03-13 DIAGNOSIS — R2689 Other abnormalities of gait and mobility: Secondary | ICD-10-CM | POA: Insufficient documentation

## 2017-03-13 DIAGNOSIS — M6281 Muscle weakness (generalized): Secondary | ICD-10-CM | POA: Diagnosis not present

## 2017-03-13 DIAGNOSIS — R2681 Unsteadiness on feet: Secondary | ICD-10-CM | POA: Diagnosis not present

## 2017-03-13 DIAGNOSIS — R293 Abnormal posture: Secondary | ICD-10-CM | POA: Insufficient documentation

## 2017-03-13 NOTE — Therapy (Signed)
[facility] [address] [place], [place], [address] Phone: [phone]   Fax:  [phone]  Physical Therapy Treatment  Patient Details  Name: [name] MRN: [id number] Date of Birth: 04-17-1975 Referring Provider: Kalman Shan, DO   Encounter Date: 03/13/2017  PT End of Session - 03/13/17 1236    Visit Number  21    Number of Visits  26    Date for PT Re-Evaluation  03/19/17    Authorization Type  Medicare Recertification every 60 days    PT Start Time  0845    PT Stop Time  0930    PT Time Calculation (min)  45 min    Equipment Utilized During Treatment  Gait belt    Activity Tolerance  Patient tolerated treatment well    Behavior During Therapy  [facility] for tasks assessed/performed       Past Medical History:  Diagnosis Date  . Acute venous embolism and thrombosis of deep vessels of proximal lower extremity ([place]) 07/19/2011  . Anemia   . Chest pain, neg MI, normal coronaries by cath 02/18/2013  . CKD (chronic kidney disease) stage 3, GFR 30-59 ml/min (HCC) 02/19/2013  . Colon polyps   . Diabetic ulcer of right foot ([place])   . DVT (deep venous thrombosis) ([place]) 09/2002   patient reports additional DVTs in '06 & '11 (unconfirmed)  . GERD (gastroesophageal reflux disease)   . History of blood transfusion    "related to OR" (10/31/2016)  . Hyperlipidemia 02/19/2013  . Hypertension   . Nausea & vomiting    "constant for the last couple weeks" (10/31/2016)  . Nephrotic syndrome   . Obesity    BMI 44, weight 346 pounds 01/30/14  . Pulmonary embolism ([place]) 09/2002   treated with 6 months of warfarin  . Type I diabetes mellitus ([place]) dx'd 2001    Past Surgical History:  Procedure Laterality Date  . AMPUTATION Right 12/22/2013   Procedure: AMPUTATION BELOW KNEE;  Surgeon: [name], MD;  Location: [place];  Service: Orthopedics;  Laterality: Right;  . AMPUTATION Right 02/19/2014   Procedure: RIGHT ABOVE KNEE  AMPUTATION ;  Surgeon: [name], MD;  Location: [place];  Service: Orthopedics;  Laterality: Right;  . CARDIAC CATHETERIZATION  02/18/2013   normal coronaries  . ESOPHAGOGASTRODUODENOSCOPY N/A 11/02/2016   Procedure: ESOPHAGOGASTRODUODENOSCOPY (EGD);  Surgeon: [name], MD;  Location: [facility] ENDOSCOPY;  Service: Endoscopy;  Laterality: N/A;  . I&D EXTREMITY Right 12/14/2013   Procedure: IRRIGATION AND DEBRIDEMENT RIGHT FOOT;  Surgeon: [name], MD;  Location: [facility];  Service: Orthopedics;  Laterality: Right;  . INCISION AND DRAINAGE ABSCESS  2007; 2015   "back"  . INCISION AND DRAINAGE OF WOUND Right 12/22/2013   Procedure: I&D RIGHT BUTTOCK;  Surgeon: [name], MD;  Location: [place];  Service: Orthopedics;  Laterality: Right;  . LEFT HEART CATHETERIZATION WITH CORONARY ANGIOGRAM N/A 02/18/2013   Procedure: LEFT HEART CATHETERIZATION WITH CORONARY ANGIOGRAM;  Surgeon: [name], MD;  Location: [facility] CATH LAB;  Service: Cardiovascular;  Laterality: N/A;    There were no vitals filed for this visit.  Subjective Assessment - 03/13/17 0850    Subjective  No falls. He got call from [place] regarding no available custom w/c over 350#. He has been doing diet from his [employer].     Patient is accompained by:  Family member    Pertinent History  Right TFA, Type 1 DM, HTN, chest  pain, DVT/PE, nephrotic syndrome, morbid obesity, LBP    Patient Stated Goals  To walk with prosthesis in community and stand to perform ADLs at home.     Currently in Pain?  No/denies      Pt donned prosthesis properly modified independent standing to tighten suspension strap.  Patient ambulated 120' with RW & prosthesis modified independent. Sit to/from stand from his w/c without armrest pushing with UEs to RW.   PT weighed patient today at 383#. He feels increase may be from fluid retention and his left leg does appear more edematous. He reports following a diet that his trainer recommended  which has 6 small set meals each day and drink 1 gallon of water. He eats same meals every day for last 10-14 days. PT advised to consult with PCP as diet may not be recommended with his medical issues.  PT instructed & pt performed supine with LLE up on wall (hips ~60*) with ankle pumps. PT recommended this position to help with edema in LLE for 20-60 minutes /day. Pt verbalized understanding.                           PT Short Term Goals - 02/22/17 1434      PT SHORT TERM GOAL #1   Title  Patient verbalizes working with prosthetist on socket revision. (All STGs Target Date: 02/16/2017)    Baseline  MET 02/15/2017    Time  4    Period  Weeks    Status  Achieved      PT SHORT TERM GOAL #2   Title  Patient tolerates prosthesis wear daily >8 hrs total /day with limb pain or skin issues.     Baseline  MET 02/15/2017    Time  4    Period  Weeks    Status  Achieved      PT SHORT TERM GOAL #3   Title  Patient reaches towards floor within 10" with RW support with supervision.     Baseline  MET 02/15/2017    Time  4    Period  Weeks    Status  Achieved      PT SHORT TERM GOAL #4   Title  Patient ambulates 34' with RW & prosthesis with supervision.    Baseline  MET 02/15/2017    Time  4    Period  Weeks    Status  Achieved      PT SHORT TERM GOAL #5   Title  Patient negotiates ramps & curbs with RW & prosthesis with supervision.     Baseline  MET 02/22/2017    Time  4    Period  Weeks    Status  Achieved        PT Long Term Goals - 03/13/17 1237      PT LONG TERM GOAL #1   Title  Patient verbalizes & demonstrates proper prosthetic care to enable safe use of prosthesis. (Target Date: 03/16/2017)    Baseline  MET 03/13/2017    Time  2    Period  Months    Status  Achieved      PT LONG TERM GOAL #2   Title  Patient tolerates daily wear of prosthesis >60% of awake hours without skin issues or limb pain to enable function throughout his day.    Time  2    Period   Months    Status  On-going  PT LONG TERM GOAL #3   Title  Patient performs standing balance activities with rolling walker support & prosthesis reaching 10" anteriorly & to floor, manage clothes modified independent.      Time  2    Period  Months    Status  On-going      PT LONG TERM GOAL #4   Title  Patient ambulates 14' with RW & prosthesis modified independent to enable basic community access.     Baseline  MET 03/13/2017    Time  2    Period  Months    Status  Achieved      PT LONG TERM GOAL #5   Title  Patient negotiates ramp, curb with RW & stairs with 2 rails with transfemoral prosthesis modified independent to enable community access.      Time  2    Period  Months    Status  On-going            Plan - 03/13/17 1238    Clinical Impression Statement  Patient met 2 of 5 LTGs. Remainder of LTGs to be checked next session. Patient has started a diet that he got from his trainer which is eating same 6 small meals each day & drinking 1 gallon of water.     Rehab Potential  Good    Clinical Impairments Affecting Rehab Potential  Morbid obesity, length of time with immobility / w/c bound, lives alone.     PT Frequency  2x / week    PT Duration  8 weeks 16 weeks    PT Treatment/Interventions  ADLs/Self Care Home Management;Gait training;DME Instruction;Stair training;Functional mobility training;Therapeutic activities;Therapeutic exercise;Balance training;Neuromuscular re-education;Patient/family education;Prosthetic Training;Manual techniques    PT Next Visit Plan  check remaining LTGs    Consulted and Agree with Plan of Care  Patient       Patient will benefit from skilled therapeutic intervention in order to improve the following deficits and impairments:  Abnormal gait, Decreased activity tolerance, Decreased balance, Decreased endurance, Decreased knowledge of use of DME, Decreased mobility, Decreased range of motion, Decreased strength, Postural dysfunction,  Prosthetic Dependency, Obesity, Pain  Visit Diagnosis: Muscle weakness (generalized)  Other abnormalities of gait and mobility     Problem List Patient Active Problem List   Diagnosis Date Noted  . Blood in stool 03/07/2017  . History of nausea and vomiting 10/31/2016  . Prosthesis adjustment 08/17/2016  . History of pulmonary embolus (PE) 06/09/2016  . Normocytic anemia 01/30/2014  . Constipation   . Above knee amputation of right lower extremity ([place]) 12/26/2013  . GERD (gastroesophageal reflux disease) 08/14/2013  . Hyperlipidemia 02/19/2013  . S/P cardiac cath, 02/18/13, normal coronaries 02/19/2013  . CKD (chronic kidney disease) stage 3, GFR 30-59 ml/min (HCC) 02/19/2013  . Nephrotic syndrome 02/12/2012  . Hypertension 07/24/2011  . Long term current use of anticoagulant therapy 07/19/2011  . Back pain 07/12/2011  . History of DVT (deep vein thrombosis) 07/28/2009  . PROTEINURIA, MILD 02/01/2007  . Morbid obesity ([place]) 10/26/2005  . DM (diabetes mellitus) type I uncontrolled, periph vascular disorder ([place]) 01/09/2002    Zohaib Heeney PT, DPT 03/13/2017, 12:41 PM  [facility] [address] [place], [place], [address] Phone: [phone]   Fax:  [phone]  Name: [name] MRN: [id number] Date of Birth: Jun 02, 1975

## 2017-03-15 ENCOUNTER — Encounter: Payer: Self-pay | Admitting: Physical Therapy

## 2017-03-15 ENCOUNTER — Ambulatory Visit: Payer: Medicare Other | Admitting: Physical Therapy

## 2017-03-15 DIAGNOSIS — R293 Abnormal posture: Secondary | ICD-10-CM | POA: Diagnosis not present

## 2017-03-15 DIAGNOSIS — R2681 Unsteadiness on feet: Secondary | ICD-10-CM | POA: Diagnosis not present

## 2017-03-15 DIAGNOSIS — R2689 Other abnormalities of gait and mobility: Secondary | ICD-10-CM

## 2017-03-15 DIAGNOSIS — M6281 Muscle weakness (generalized): Secondary | ICD-10-CM

## 2017-03-15 NOTE — Therapy (Signed)
[facility] [address] [place], [place], [address] Phone: [phone]   Fax:  [phone]  Physical Therapy Treatment  Patient Details  Name: [name] MRN: [id number] Date of Birth: [birth year] Referring Provider: Kalman Shan, DO   Encounter Date: 03/15/2017  PT End of Session - 03/15/17 1028    Visit Number  22    Number of Visits  26    Date for PT Re-Evaluation  03/19/17    Authorization Type  Medicare Recertification every 60 days    PT Start Time  0835    PT Stop Time  0925    PT Time Calculation (min)  50 min    Equipment Utilized During Treatment  Gait belt    Activity Tolerance  Patient tolerated treatment well    Behavior During Therapy  [facility] for tasks assessed/performed       Past Medical History:  Diagnosis Date  . Acute venous embolism and thrombosis of deep vessels of proximal lower extremity ([place]) 07/19/2011  . Anemia   . Chest pain, neg MI, normal coronaries by cath 02/18/2013  . CKD (chronic kidney disease) stage 3, GFR 30-59 ml/min (HCC) 02/19/2013  . Colon polyps   . Diabetic ulcer of right foot ([place])   . DVT (deep venous thrombosis) ([place]) 09/2002   patient reports additional DVTs in '06 & '11 (unconfirmed)  . GERD (gastroesophageal reflux disease)   . History of blood transfusion    "related to OR" (10/31/2016)  . Hyperlipidemia 02/19/2013  . Hypertension   . Nausea & vomiting    "constant for the last couple weeks" (10/31/2016)  . Nephrotic syndrome   . Obesity    BMI 44, weight 346 pounds 01/30/14  . Pulmonary embolism ([place]) 09/2002   treated with 6 months of warfarin  . Type I diabetes mellitus ([place]) dx'd 2001    Past Surgical History:  Procedure Laterality Date  . AMPUTATION Right 12/22/2013   Procedure: AMPUTATION BELOW KNEE;  Surgeon: [name], MD;  Location: [facility];  Service: Orthopedics;  Laterality: Right;  . AMPUTATION Right 02/19/2014   Procedure: RIGHT ABOVE KNEE  AMPUTATION ;  Surgeon: [name], MD;  Location: [facility];  Service: Orthopedics;  Laterality: Right;  . CARDIAC CATHETERIZATION  02/18/2013   normal coronaries  . ESOPHAGOGASTRODUODENOSCOPY N/A 11/02/2016   Procedure: ESOPHAGOGASTRODUODENOSCOPY (EGD);  Surgeon: [name], MD;  [facility] ENDOSCOPY;  Service: Endoscopy;  Laterality: N/A;  . I&D EXTREMITY Right 12/14/2013   Procedure: IRRIGATION AND DEBRIDEMENT RIGHT FOOT;  Surgeon: [name], MD;  Location: [facility];  Service: Orthopedics;  Laterality: Right;  . INCISION AND DRAINAGE ABSCESS  2007; 2015   "back"  . INCISION AND DRAINAGE OF WOUND Right 12/22/2013   Procedure: I&D RIGHT BUTTOCK;  Surgeon: [name], MD;  Location: [facility];  Service: Orthopedics;  Laterality: Right;  . LEFT HEART CATHETERIZATION WITH CORONARY ANGIOGRAM N/A 02/18/2013   Procedure: LEFT HEART CATHETERIZATION WITH CORONARY ANGIOGRAM;  Surgeon: [name], MD;  Location: [facility] CATH LAB;  Service: Cardiovascular;  Laterality: N/A;    There were no vitals filed for this visit.  Subjective Assessment - 03/15/17 0839    Subjective  No falls. He wears prosthesis 2.5hrs at gym and 3-4 hrs at home. He is awake ~16 hrs/day.     Patient is accompained by:  Family member    Pertinent History  Right TFA, Type 1 DM, HTN, chest pain, DVT/PE, nephrotic syndrome, morbid obesity, LBP  Patient Stated Goals  To walk with prosthesis in community and stand to perform ADLs at home.     Currently in Pain?  No/denies         [facility] PT Assessment - 03/15/17 0840      Assessment   Medical Diagnosis  Rt Transfemoral Amputation    Referring Provider  Kalman Shan, DO      Balance Screen   Has the patient fallen in the past 6 months  No    Has the patient had a decrease in activity level because of a fear of falling?   No    Is the patient reluctant to leave their home because of a fear of falling?   No      Transfers   Transfers  Sit to Stand;Stand to Sit    Sit to  Stand  6: Modified independent (Device/Increase time);With upper extremity assist;From chair/3-in-1 to RW    Stand to Sit  6: Modified independent (Device/Increase time);With upper extremity assist;To chair/3-in-1 from RW      Ambulation/Gait   Ambulation/Gait  Yes    Ambulation/Gait Assistance  6: Modified independent (Device/Increase time)    Assistive device  Rolling walker;Prosthesis    Ramp  6: Modified independent (Device) RW & TFA prosthesis    Curb  6: Modified independent (Device/increase time) RW & TFA prosthesis      Static Standing Balance   Static Standing - Balance Support  No upper extremity supported;Left upper extremity supported NO UE support needs object near for safety    Static Standing - Level of Assistance  6: Modified independent (Device/Increase time);5: Stand by assistance No UE support is supervision or nearby object to touch    Static Standing - Comment/# of Minutes  up to 32 sec without UE support, >5 min with UE support      Dynamic Standing Balance   Dynamic Standing - Balance Support  Left upper extremity supported RW    Dynamic Standing - Level of Assistance  6: Modified independent (Device/Increase time)    Dynamic Standing - Comments  reaches 10" anteriorly, to floor & manages clothes safely with RW support modified independent.                           PT Education - 03/15/17 0920    Education provided  Yes    Education Details  heavy duty manual w/c with goal to get weight <350#,  consider bariatric surgery consult and discuss  pros & cons with his doctors,  discharge plan with ongoing exercise program    Person(s) Educated  Patient    Methods  Explanation;Verbal cues    Comprehension  Verbalized understanding       PT Short Term Goals - 02/22/17 1434      PT SHORT TERM GOAL #1   Title  Patient verbalizes working with prosthetist on socket revision. (All STGs Target Date: 02/16/2017)    Baseline  MET 02/15/2017    Time  4     Period  Weeks    Status  Achieved      PT SHORT TERM GOAL #2   Title  Patient tolerates prosthesis wear daily >8 hrs total /day with limb pain or skin issues.     Baseline  MET 02/15/2017    Time  4    Period  Weeks    Status  Achieved      PT SHORT TERM GOAL #3  Title  Patient reaches towards floor within 10" with RW support with supervision.     Baseline  MET 02/15/2017    Time  4    Period  Weeks    Status  Achieved      PT SHORT TERM GOAL #4   Title  Patient ambulates 25' with RW & prosthesis with supervision.    Baseline  MET 02/15/2017    Time  4    Period  Weeks    Status  Achieved      PT SHORT TERM GOAL #5   Title  Patient negotiates ramps & curbs with RW & prosthesis with supervision.     Baseline  MET 02/22/2017    Time  4    Period  Weeks    Status  Achieved        PT Long Term Goals - 03/15/17 1029      PT LONG TERM GOAL #1   Title  Patient verbalizes & demonstrates proper prosthetic care to enable safe use of prosthesis. (Target Date: 03/16/2017)    Baseline  MET 03/13/2017    Time  2    Period  Months    Status  Achieved      PT LONG TERM GOAL #2   Title  Patient tolerates daily wear of prosthesis >60% of awake hours without skin issues or limb pain to enable function throughout his day.    Baseline  NOT MET due to ill-fitting prosthetic socket & no foot rests on current w/c in poor condition  He wears prosthesis ~6 of 16 awake hours. He reports increased wear when at gym or home but hard to wear during transport.     Time  2    Period  Months    Status  Not Met      PT LONG TERM GOAL #3   Title  Patient performs standing balance activities with rolling walker support & prosthesis reaching 10" anteriorly & to floor, manage clothes modified independent.      Baseline  MET 03/15/2017    Time  2    Period  Months    Status  Achieved      PT LONG TERM GOAL #4   Title  Patient ambulates 100' with RW & prosthesis modified independent to enable basic community  access.     Baseline  MET 03/13/2017    Time  2    Period  Months    Status  Achieved      PT LONG TERM GOAL #5   Title  Patient negotiates ramp, curb with RW & stairs with 2 rails with transfemoral prosthesis modified independent to enable community access.      Baseline  MET 03/15/2017    Time  2    Period  Months    Status  Achieved            Plan - 03/15/17 1031    Clinical Impression Statement  Patient met 4 of 5 LTGs. Wear LTG not met due to no foot/legrest on w/c which is poor condition and ill-fitting prosthetic socket making prosthesis wear difficult when moving from one area to another in w/c.  Patient needs a heavy duty w/c with removable armrests & swing away foot rests and a w/c cushion.  He also would be able to improve his function if he could loose weight.  PT recommended looking into Bariatric surgery option including discussion with his medical doctors.     Rehab Potential  Good    Clinical Impairments Affecting Rehab Potential  Morbid obesity, length of time with immobility / w/c bound, lives alone.     PT Frequency  2x / week    PT Duration  8 weeks 16 weeks    PT Treatment/Interventions  ADLs/Self Care Home Management;Gait training;DME Instruction;Stair training;Functional mobility training;Therapeutic activities;Therapeutic exercise;Balance training;Neuromuscular re-education;Patient/family education;Prosthetic Training;Manual techniques    PT Next Visit Plan  discharge PT.     Recommended Other Services  refer back to PT when he recieves new prosthetic socket.     Consulted and Agree with Plan of Care  Patient       Patient will benefit from skilled therapeutic intervention in order to improve the following deficits and impairments:  Abnormal gait, Decreased activity tolerance, Decreased balance, Decreased endurance, Decreased knowledge of use of DME, Decreased mobility, Decreased range of motion, Decreased strength, Postural dysfunction, Prosthetic Dependency,  Obesity, Pain  Visit Diagnosis: Muscle weakness (generalized)  Other abnormalities of gait and mobility  Abnormal posture  Unsteadiness on feet     Problem List Patient Active Problem List   Diagnosis Date Noted  . Blood in stool 03/07/2017  . History of nausea and vomiting 10/31/2016  . Prosthesis adjustment 08/17/2016  . History of pulmonary embolus (PE) 06/09/2016  . Normocytic anemia 01/30/2014  . Constipation   . Above knee amputation of right lower extremity ([place]) 12/26/2013  . GERD (gastroesophageal reflux disease) 08/14/2013  . Hyperlipidemia 02/19/2013  . S/P cardiac cath, 02/18/13, normal coronaries 02/19/2013  . CKD (chronic kidney disease) stage 3, GFR 30-59 ml/min (HCC) 02/19/2013  . Nephrotic syndrome 02/12/2012  . Hypertension 07/24/2011  . Long term current use of anticoagulant therapy 07/19/2011  . Back pain 07/12/2011  . History of DVT (deep vein thrombosis) 07/28/2009  . PROTEINURIA, MILD 02/01/2007  . Morbid obesity ([place]) 10/26/2005  . DM (diabetes mellitus) type I uncontrolled, periph vascular disorder ([place]) 01/09/2002    PHYSICAL THERAPY DISCHARGE SUMMARY  Visits from Start of Care: 22  Current functional level related to goals / functional outcomes: See above   Remaining deficits: See above   Education / Equipment: Prosthetic care & ongoing HEP/fitness plan.  Needs heavy duty manual w/c & socket revision.   Plan: Patient agrees to discharge.  Patient goals were partially met. Patient is being discharged due to meeting the stated rehab goals.  ?????         Rosalena Mccorry PT, DPT 03/15/2017, 10:40 AM  [facility] [address] [place], [place], [address] Phone: [phone]   Fax:  [phone]  Name: [name] MRN: [id number] Date of Birth: [birth year]

## 2017-03-19 ENCOUNTER — Other Ambulatory Visit: Payer: Self-pay | Admitting: Internal Medicine

## 2017-03-19 ENCOUNTER — Telehealth: Payer: Self-pay | Admitting: Physical Therapy

## 2017-03-19 DIAGNOSIS — S78111A Complete traumatic amputation at level between right hip and knee, initial encounter: Secondary | ICD-10-CM

## 2017-03-19 NOTE — Telephone Encounter (Signed)
[name] needs a new wheelchair as the one he has was donated to him and in poor condition. We initially were planning a custom manual w/c but no manufacturer makes a custom frame for patients who are over 350#. [name] weighs 378-380# without his prosthesis and the prosthesis would add ~40# more. So we are requesting prescription for heavy duty manual w/c with removable armrests and swing away footrests, a solid wood insert and general use w/c cushion.  As he has Medicare & Medicaid, this w/c will be rent to own for 13 months. If he is able to get his weight within acceptable limits for custom w/c, then we can submit at that time & exchange wheelchairs.  Please FAX your prescription for heavy duty manual w/c with removable armrests and swing away footrests, a solid wood insert and general use w/c cushion to [facility] ATTN: Luz Brazen, ATP at [phone]. If you feel you need to speak to [name], his cell is [phone] or office [phone]. Thank you Jamey Reas, PT, DPT PT Specializing in [place] 03/19/2017@ 8:44 AM Phone:  [phone]  Fax:  [phone] [facility] [address] [place], [place] [address]

## 2017-03-19 NOTE — Telephone Encounter (Signed)
Hi [name],  I received a request to place an order for a manuel wheelchair for [name].   He needs a heavy duty manual w/c with removable armrests and swing away footrests, a solid wood insert and general use.  They asked for the prescription  to be sent to [facility] ATTN: Luz Brazen, ATP at [phone].  I placed an order.  Could you please fax this.  Thank you!

## 2017-03-21 NOTE — Telephone Encounter (Signed)
Order faxed.

## 2017-03-22 ENCOUNTER — Encounter: Payer: Medicare Other | Admitting: Physical Therapy

## 2017-03-26 DIAGNOSIS — I129 Hypertensive chronic kidney disease with stage 1 through stage 4 chronic kidney disease, or unspecified chronic kidney disease: Secondary | ICD-10-CM | POA: Diagnosis not present

## 2017-03-26 DIAGNOSIS — N183 Chronic kidney disease, stage 3 (moderate): Secondary | ICD-10-CM | POA: Diagnosis not present

## 2017-03-26 DIAGNOSIS — N2581 Secondary hyperparathyroidism of renal origin: Secondary | ICD-10-CM | POA: Diagnosis not present

## 2017-03-26 DIAGNOSIS — Z6841 Body Mass Index (BMI) 40.0 and over, adult: Secondary | ICD-10-CM | POA: Diagnosis not present

## 2017-03-26 DIAGNOSIS — E1122 Type 2 diabetes mellitus with diabetic chronic kidney disease: Secondary | ICD-10-CM | POA: Diagnosis not present

## 2017-03-26 DIAGNOSIS — D631 Anemia in chronic kidney disease: Secondary | ICD-10-CM | POA: Diagnosis not present

## 2017-03-26 DIAGNOSIS — Z794 Long term (current) use of insulin: Secondary | ICD-10-CM | POA: Diagnosis not present

## 2017-03-26 DIAGNOSIS — Z86718 Personal history of other venous thrombosis and embolism: Secondary | ICD-10-CM | POA: Diagnosis not present

## 2017-03-27 ENCOUNTER — Encounter: Payer: Medicare Other | Admitting: Physical Therapy

## 2017-03-29 ENCOUNTER — Encounter: Payer: Medicare Other | Admitting: Physical Therapy

## 2017-04-19 ENCOUNTER — Other Ambulatory Visit: Payer: Self-pay

## 2017-04-19 ENCOUNTER — Encounter: Payer: Self-pay | Admitting: Internal Medicine

## 2017-04-19 ENCOUNTER — Ambulatory Visit (INDEPENDENT_AMBULATORY_CARE_PROVIDER_SITE_OTHER): Payer: Medicare Other | Admitting: Internal Medicine

## 2017-04-19 VITALS — BP 138/69 | HR 84 | Temp 98.5°F

## 2017-04-19 DIAGNOSIS — N529 Male erectile dysfunction, unspecified: Secondary | ICD-10-CM | POA: Diagnosis not present

## 2017-04-19 DIAGNOSIS — N3944 Nocturnal enuresis: Secondary | ICD-10-CM | POA: Diagnosis not present

## 2017-04-19 DIAGNOSIS — I10 Essential (primary) hypertension: Secondary | ICD-10-CM | POA: Diagnosis not present

## 2017-04-19 DIAGNOSIS — E1051 Type 1 diabetes mellitus with diabetic peripheral angiopathy without gangrene: Secondary | ICD-10-CM

## 2017-04-19 DIAGNOSIS — IMO0002 Reserved for concepts with insufficient information to code with codable children: Secondary | ICD-10-CM

## 2017-04-19 DIAGNOSIS — Z79899 Other long term (current) drug therapy: Secondary | ICD-10-CM

## 2017-04-19 DIAGNOSIS — Z89511 Acquired absence of right leg below knee: Secondary | ICD-10-CM | POA: Diagnosis not present

## 2017-04-19 DIAGNOSIS — E1065 Type 1 diabetes mellitus with hyperglycemia: Principal | ICD-10-CM

## 2017-04-19 LAB — POCT URINALYSIS DIPSTICK
Bilirubin, UA: NEGATIVE
Blood, UA: NEGATIVE
Glucose, UA: NEGATIVE
Ketones, UA: NEGATIVE
Leukocytes, UA: NEGATIVE
Nitrite, UA: NEGATIVE
Protein, UA: 100
Spec Grav, UA: 1.01 (ref 1.010–1.025)
Urobilinogen, UA: 1 E.U./dL
pH, UA: 5.5 (ref 5.0–8.0)

## 2017-04-19 LAB — GLUCOSE, CAPILLARY: Glucose-Capillary: 131 mg/dL — ABNORMAL HIGH (ref 65–99)

## 2017-04-19 NOTE — Patient Instructions (Signed)
Mr. Mucci,  Please follow up with me in one month. Please limit your water intake to 6-8 glasses of water a day and stop drinking water after 7pm A urology referral has been made

## 2017-04-19 NOTE — Progress Notes (Signed)
   CC: follow-up on essential hypertension  HPI:  [name] is a 42 y.o. male with history noted below that presents to the internal medicine clinic for follow-up of essential hypertension. Please see problem based charting for the status of patient's chronic medical conditions.  Past Medical History:  Diagnosis Date  . Acute venous embolism and thrombosis of deep vessels of proximal lower extremity ([place]) 07/19/2011  . Anemia   . Chest pain, neg MI, normal coronaries by cath 02/18/2013  . CKD (chronic kidney disease) stage 3, GFR 30-59 ml/min (HCC) 02/19/2013  . Colon polyps   . Diabetic ulcer of right foot ([place])   . DVT (deep venous thrombosis) ([facility]) 09/2002   patient reports additional DVTs in '06 & '11 (unconfirmed)  . GERD (gastroesophageal reflux disease)   . History of blood transfusion    "related to OR" (10/31/2016)  . Hyperlipidemia 02/19/2013  . Hypertension   . Nausea & vomiting    "constant for the last couple weeks" (10/31/2016)  . Nephrotic syndrome   . Obesity    BMI 44, weight 346 pounds 01/30/14  . Pulmonary embolism ([place]) 09/2002   treated with 6 months of warfarin  . Type I diabetes mellitus ([place]) dx'd 2001    Review of Systems:  Review of Systems  Constitutional: Negative for fever.  Respiratory: Negative for shortness of breath.   Cardiovascular: Negative for chest pain.  Gastrointestinal: Negative for abdominal pain, nausea and vomiting.  Genitourinary: Negative for dysuria, frequency and urgency.     Physical Exam:  Vitals:   04/19/17 1351  BP: 138/69  Pulse: 84  Temp: 98.5 F (36.9 C)  TempSrc: Oral  SpO2: 97%   Physical Exam  Constitutional: He is well-developed, well-nourished, and in no distress.  Cardiovascular: Normal rate, regular rhythm and normal heart sounds. Exam reveals no gallop and no friction rub.  No murmur heard. Pulmonary/Chest: Effort normal and breath sounds normal. No respiratory distress. He has no wheezes. He has no  rales.  Musculoskeletal:  Right BKA     Assessment & Plan:   See encounters tab for problem based medical decision making.   Patient discussed with Dr. Rebeca Alert

## 2017-04-20 LAB — MICROSCOPIC EXAMINATION
Casts: NONE SEEN /lpf
Epithelial Cells (non renal): NONE SEEN /hpf (ref 0–10)

## 2017-04-20 LAB — URINALYSIS, ROUTINE W REFLEX MICROSCOPIC
Bilirubin, UA: NEGATIVE
Glucose, UA: NEGATIVE
Ketones, UA: NEGATIVE
Leukocytes, UA: NEGATIVE
Nitrite, UA: NEGATIVE
RBC, UA: NEGATIVE
Specific Gravity, UA: 1.009 (ref 1.005–1.030)
Urobilinogen, Ur: 1 mg/dL (ref 0.2–1.0)
pH, UA: 5.5 (ref 5.0–7.5)

## 2017-04-22 DIAGNOSIS — N529 Male erectile dysfunction, unspecified: Secondary | ICD-10-CM | POA: Insufficient documentation

## 2017-04-22 NOTE — Assessment & Plan Note (Signed)
Assessment:  Nocturnal enuresis Patient reports a several week history of nocturnal enuresis.  He states that when he sleeps at night he wakes up in urine.  Also occurs when taking a nap.  He states that he has recently increased his water intake to 10 glasses of water a day due to an exercise routine.  He denies caffiene use.  He has his last drink at 10pm.  He denies BPH symptoms including difficulty starting and stopping stream and having to get up to use the bathroom at night.  I encouraged patient to drink less than 8 glasses of water a day with last drink at 7pm.  Will obtain a UA.  Will also get a sleep study as OSA could be a contributing factor.  Will also refer to urology.    -sleep study - UA - urology referral

## 2017-04-22 NOTE — Assessment & Plan Note (Signed)
Assessment: Type II diabetes Mellitus Patient currently takes novolog 25 units TID and tresiba 140 daily.  previous A1c was 7.9, 2 months ago.  Not due for another hemoglobin A1C for another month.  CBG 131 today.  Did not bring a meter.  Check hgb A1C in one month.  Plan - cbg, hgbA1C in one month - Continue Tresiba 140 units daily  - Continue Novolog 25 units TID with meals

## 2017-04-22 NOTE — Assessment & Plan Note (Signed)
Assessment:  Erectile dysfunction IIEF showed:  Domain A: Erectile function - 10, Domain B: oragnic function - 3, Domain C: Sexual desire - 10, Domain D: Intercourse satisfaction - 5, Domain E: overall satisfaction  -2.  Because of scores will refer to urology.  Not completely clear but patient may also benefit from psychosocial conseling.  Patient would like testosterone checked.  This is less likely the cause of his ED.  Plan -urology referral -testosterone, total for the am

## 2017-04-22 NOTE — Assessment & Plan Note (Signed)
Assessment:  Essential hypertension Patient currently takes carvedilol 25mg  BID, lasix 80mg  BID, metolazone 2.5mg  every other day, clonidine 0.2mg  BID and amlodipine 5mg  daily.  Today's blood pressure is 138/69  Plan -continue current blood pressure regimen

## 2017-04-24 ENCOUNTER — Ambulatory Visit (INDEPENDENT_AMBULATORY_CARE_PROVIDER_SITE_OTHER): Payer: Medicare Other | Admitting: Endocrinology

## 2017-04-24 ENCOUNTER — Encounter: Payer: Self-pay | Admitting: Endocrinology

## 2017-04-24 VITALS — BP 140/72 | HR 84 | Ht 74.0 in | Wt 392.6 lb

## 2017-04-24 DIAGNOSIS — Z89611 Acquired absence of right leg above knee: Secondary | ICD-10-CM | POA: Diagnosis not present

## 2017-04-24 DIAGNOSIS — Z794 Long term (current) use of insulin: Secondary | ICD-10-CM | POA: Diagnosis not present

## 2017-04-24 DIAGNOSIS — E1165 Type 2 diabetes mellitus with hyperglycemia: Secondary | ICD-10-CM

## 2017-04-24 DIAGNOSIS — N184 Chronic kidney disease, stage 4 (severe): Secondary | ICD-10-CM

## 2017-04-24 DIAGNOSIS — S78111A Complete traumatic amputation at level between right hip and knee, initial encounter: Secondary | ICD-10-CM

## 2017-04-24 DIAGNOSIS — R7989 Other specified abnormal findings of blood chemistry: Secondary | ICD-10-CM | POA: Diagnosis not present

## 2017-04-24 MED ORDER — SEMAGLUTIDE(0.25 OR 0.5MG/DOS) 2 MG/1.5ML ~~LOC~~ SOPN: 0.5000 mg | PEN_INJECTOR | SUBCUTANEOUS | 2 refills | Status: DC

## 2017-04-24 NOTE — Progress Notes (Signed)
Patient ID: [name], male   DOB: [birth year], 42 y.o.   MRN: [id number]          Reason for Appointment: Consultation for Type 2 Diabetes  Referring physician: Jannette Fogo   History of Present Illness:          Date of diagnosis of type 2 diabetes mellitus:  2001      His weight is about 400 pounds at the time of diagnosis He does not know what his initial blood sugars and circumstances were but he apparently was started on insulin initially He was not able to tolerate metformin because of diarrhea Subsequently has been on insulin only, level of control not available but his A1c was significantly high at 12.8 in 2015  Background history:    Recent history:   INSULIN regimen is:  140 units Tresiba, NovoLog 25 U tid     Non-insulin hypoglycemic drugs the patient is taking are: Tradjenta 5 mg daily  His A1c was 7.9 done in 2/19, previously 6.3  Current management, blood sugar patterns and problems identified:   He has been on various insulin regimens in the past  Since 2018 has been on [place] and his dose has been about the same for some time  He also takes NovoLog before each meal 3 times a day  Since about a month ago he has been on a very low carbohydrate small portion diet with increased protein and protein drinks or egg whites for snacks.  He has been on this diet from his gym instructor  Also since he has been discharged from the rehab facility he has been trying to exercise at least for the last month  He is exercising fairly vigorously in the mornings after breakfast  Although he is checking blood sugars reportedly at least 3 times a day does not do any readings in the mornings on waking up  He had been on Victoza for a couple of months when he was in the rehab facility but he thinks that because of the poor diet his blood sugars were not as well controlled at that time  More recently has been switched to [place] again by his PCP  He tends to feel hypoglycemic  after he exercises but does continue to take NovoLog before breakfast in the morning        Side effects from medications have been: Diarrhea with metformin  Compliance with the medical regimen: Good Hypoglycemia:   As above  Glucose monitoring:  done?  3 times a day         Glucometer:   ACCU-CHEK       Blood Glucose readings by time of day by recall   PREMEAL Breakfast Lunch Dinner Bedtime  Overall   Glucose range:  80s 110    Median:        POST-MEAL PC Breakfast PC Lunch PC Dinner  Glucose range: 59 +  120  Median:      Self-care: The diet that the patient has been following is: low carb meals, since March    Typical meal intake: Breakfast is egg whites, some oatmeal and banana.  Usually small portions of meat or other protein at lunch and dinner and only a small amount of sweet potato at lunch or carbohydrate              Dietician visit, most recent: Unknown               Exercise:  Weights, resistance and some  cardio at the gym at least 5 days a week  Weight history:  Wt Readings from Last 3 Encounters:  04/24/17 (!) 392 lb 9.6 oz (178.1 kg)  11/16/16 (!) 369 lb 1.6 oz (167.4 kg)  11/02/16 (!) 353 lb (160.1 kg)    Glycemic control:   Lab Results  Component Value Date   HGBA1C 7.9 02/15/2017   HGBA1C 6.3 10/31/2016   HGBA1C 6.2 08/03/2016   Lab Results  Component Value Date   MICROALBUR 160.61 (H) 08/14/2013   LDLCALC 53 02/19/2014   CREATININE 2.01 (H) 02/07/2017   Lab Results  Component Value Date   MICRALBCREAT 818.2 (H) 08/14/2013    No results found for: FRUCTOSAMINE    Allergies as of 04/24/2017      Reactions   Reglan [metoclopramide] Other (See Comments)   Dysphoric reaction      Medication List        Accurate as of 04/24/17  8:51 PM. Always use your most recent med list.          ACCU-CHEK AVIVA device Use to check blood sugar 5 times daily. Diag code E10.51. Multiple insulin dependent   accu-chek softclix lancets Use to  check blood sugars 5 times a day. Dx code:E11.9. Insulin dependent.   allopurinol 100 MG tablet Commonly known as:  ZYLOPRIM Take 2 tablets (200 mg total) by mouth daily.   Alpha-Lipoic Acid 200 MG Caps Take 1 capsule (200 mg total) by mouth daily.   amLODipine 5 MG tablet Commonly known as:  NORVASC Take 1 tablet (5 mg total) by mouth daily.   apixaban 5 MG Tabs tablet Commonly known as:  ELIQUIS Take 1 tablet (5 mg total) by mouth 2 (two) times daily.   B-12 2500 MCG Subl Place 2,500 mcg under the tongue daily.   calcitRIOL 0.25 MCG capsule Commonly known as:  ROCALTROL Take 1 capsule (0.25 mcg total) by mouth daily.   carvedilol 25 MG tablet Commonly known as:  COREG Take 1 tablet (25 mg total) by mouth 2 (two) times daily with a meal.   cloNIDine 0.2 MG tablet Commonly known as:  CATAPRES Take 1 tablet (0.2 mg total) by mouth 2 (two) times daily.   DOCOSAHEXAENOIC ACID PO Take 660 mg by mouth daily.   furosemide 40 MG tablet Commonly known as:  LASIX Take 2 tablets (80 mg total) by mouth 2 (two) times daily.   gabapentin 300 MG capsule Commonly known as:  NEURONTIN Take 1 capsule (300 mg total) by mouth 3 (three) times daily.   glucose blood test strip Commonly known as:  ACCU-CHEK AVIVA Use to check blood sugars 5 times a day. Dx code:E11.9. Insulin dependent.   hydrALAZINE 50 MG tablet Commonly known as:  APRESOLINE Take 50 mg by mouth 4 (four) times daily.   insulin aspart 100 UNIT/ML FlexPen Commonly known as:  NOVOLOG Inject 25 Units into the skin 3 (three) times daily.   Insulin Degludec 200 UNIT/ML Sopn Inject 140 Units into the skin daily.   linagliptin 5 MG Tabs tablet Commonly known as:  TRADJENTA Take 1 tablet (5 mg total) by mouth daily.   loratadine 10 MG tablet Commonly known as:  CLARITIN Take 10 mg by mouth daily as needed for allergies.   lovastatin 20 MG tablet Commonly known as:  MEVACOR Take 1 tablet (20 mg total) by mouth  daily.   metolazone 2.5 MG tablet Commonly known as:  ZAROXOLYN Take 2.5 mg by mouth every other day.  Pen Needles 3/16" 31G X 5 MM Misc Use five times daily   polyethylene glycol packet Commonly known as:  MIRALAX / GLYCOLAX Take 17 g by mouth daily as needed for mild constipation.   Semaglutide 0.25 or 0.5 MG/DOSE Sopn Commonly known as:  OZEMPIC Inject 0.5 mg into the skin once a week.   senna-docusate 8.6-50 MG tablet Commonly known as:  Senokot-S Take 1 tablet by mouth daily.   Zinc Gluconate 100 MG Tabs Take 2.2 tablets (220 mg total) by mouth daily.       Allergies:  Allergies  Allergen Reactions  . Reglan [Metoclopramide] Other (See Comments)    Dysphoric reaction    Past Medical History:  Diagnosis Date  . Acute venous embolism and thrombosis of deep vessels of proximal lower extremity ([place]) 07/19/2011  . Anemia   . Chest pain, neg MI, normal coronaries by cath 02/18/2013  . CKD (chronic kidney disease) stage 3, GFR 30-59 ml/min ([facility]) 02/19/2013  . Colon polyps   . Diabetic ulcer of right foot ([place])   . DVT (deep venous thrombosis) ([place]) 09/2002   patient reports additional DVTs in '06 & '11 (unconfirmed)  . GERD (gastroesophageal reflux disease)   . History of blood transfusion    "related to OR" (10/31/2016)  . Hyperlipidemia 02/19/2013  . Hypertension   . Nausea & vomiting    "constant for the last couple weeks" (10/31/2016)  . Nephrotic syndrome   . Obesity    BMI 44, weight 346 pounds 01/30/14  . Pulmonary embolism ([place]) 09/2002   treated with 6 months of warfarin  . Type I diabetes mellitus ([place]) dx'd 2001    Past Surgical History:  Procedure Laterality Date  . AMPUTATION Right 12/22/2013   Procedure: AMPUTATION BELOW KNEE;  Surgeon: [name], MD;  Location: [place];  Service: Orthopedics;  Laterality: Right;  . AMPUTATION Right 02/19/2014   Procedure: RIGHT ABOVE KNEE AMPUTATION ;  Surgeon: [name], MD;  Location: [place];  Service:  Orthopedics;  Laterality: Right;  . CARDIAC CATHETERIZATION  02/18/2013   normal coronaries  . ESOPHAGOGASTRODUODENOSCOPY N/A 11/02/2016   Procedure: ESOPHAGOGASTRODUODENOSCOPY (EGD);  Surgeon: [name], MD;  Location: [facility] ENDOSCOPY;  Service: Endoscopy;  Laterality: N/A;  . I&D EXTREMITY Right 12/14/2013   Procedure: IRRIGATION AND DEBRIDEMENT RIGHT FOOT;  Surgeon: [name], MD;  Location: [place];  Service: Orthopedics;  Laterality: Right;  . INCISION AND DRAINAGE ABSCESS  2007; 2015   "back"  . INCISION AND DRAINAGE OF WOUND Right 12/22/2013   Procedure: I&D RIGHT BUTTOCK;  Surgeon: [name], MD;  Location: [place];  Service: Orthopedics;  Laterality: Right;  . LEFT HEART CATHETERIZATION WITH CORONARY ANGIOGRAM N/A 02/18/2013   Procedure: LEFT HEART CATHETERIZATION WITH CORONARY ANGIOGRAM;  Surgeon: [name], MD;  [facility] C[facility] LAB;  Service: Cardiovascular;  Laterality: N/A;    Family History  Problem Relation Age of Onset  . Heart disease Mother   . Diabetes Mother   . Diabetes Maternal Uncle   . Diabetes Cousin     Social History:  reports that he has never smoked. He has never used smokeless tobacco. He reports that he drinks alcohol. He reports that he does not use drugs.   Review of Systems  Constitutional: Negative for reduced appetite.  HENT: Negative for headaches.   Eyes: Negative for visual disturbance.  Respiratory: Negative for shortness of breath.   Cardiovascular: Positive for leg swelling.  Left leg has some persistent swelling and is using support stockings  Gastrointestinal: Negative for constipation and diarrhea.  Endocrine: Positive for erectile dysfunction. Negative for fatigue and decreased libido.  Genitourinary: Negative for nocturia.  Musculoskeletal: Negative for joint pain.  Skin: Negative for rash.  Neurological: Negative for numbness and tingling.  Psychiatric/Behavioral: Negative for insomnia.   LOW TESTOSTERONE:  Earlier this year when he was in the rehab facility he was told that he had a low testosterone and was started on testosterone injections He was taking 50 mg every 2 weeks With this he thinks he felt more energetic However he stopped taking the injection because of fear of getting blood clots which she has had several times.  Reportedly he has had normal prolactin levels and LH but no results are available  Lipid history: On Mevacor, no labs available    Lab Results  Component Value Date   CHOL 94 02/19/2014   HDL 23 (L) 02/19/2014   LDLCALC 53 02/19/2014   TRIG 91 02/19/2014   CHOLHDL 4.1 02/19/2014           Hypertension:  BP Readings from Last 3 Encounters:  04/24/17 140/72  04/19/17 138/69  03/06/17 140/69    Most recent eye exam was in 2018  Most recent foot exam: 4/19    LABS:  Office Visit on 04/19/2017  Component Date Value Ref Range Status  . Glucose-Capillary 04/19/2017 131* 65 - 99 mg/dL Final  . Comment 1 04/19/2017 Document in Chart   Final  . Color, UA 04/19/2017 yellow   Final  . Clarity, UA 04/19/2017 clear   Final  . Glucose, UA 04/19/2017 negative   Final  . Bilirubin, UA 04/19/2017 negative   Final  . Ketones, UA 04/19/2017 negative   Final  . Spec Grav, UA 04/19/2017 1.010  1.010 - 1.025 Final  . Blood, UA 04/19/2017 negative   Final  . pH, UA 04/19/2017 5.5  5.0 - 8.0 Final  . Protein, UA 04/19/2017 100 mg/dL   Final  . Urobilinogen, UA 04/19/2017 1.0  0.2 or 1.0 E.U./dL Final  . Nitrite, UA 04/19/2017 negative   Final  . Leukocytes, UA 04/19/2017 Negative  Negative Final  . Appearance 04/19/2017 clear   Final  . Odor 04/19/2017 none   Final  . Specific Gravity, UA 04/19/2017 1.009  1.005 - 1.030 Final  . pH, UA 04/19/2017 5.5  5.0 - 7.5 Final  . Color, UA 04/19/2017 Yellow  Yellow Final  . Appearance Ur 04/19/2017 Clear  Clear Final  . Leukocytes, UA 04/19/2017 Negative  Negative Final  . Protein, UA 04/19/2017 2+* Negative/Trace Final  .  Glucose, UA 04/19/2017 Negative  Negative Final  . Ketones, UA 04/19/2017 Negative  Negative Final  . RBC, UA 04/19/2017 Negative  Negative Final  . Bilirubin, UA 04/19/2017 Negative  Negative Final  . Urobilinogen, Ur 04/19/2017 1.0  0.2 - 1.0 mg/dL Final  . Nitrite, UA 04/19/2017 Negative  Negative Final  . Microscopic Examination 04/19/2017 See below:   Final   Microscopic was indicated and was performed.  . WBC, UA 04/19/2017 0-5  0 - 5 /hpf Final  . RBC, UA 04/19/2017 0-2  0 - 2 /hpf Final  . Epithelial Cells (non renal) 04/19/2017 None seen  0 - 10 /hpf Final  . Casts 04/19/2017 None seen  None seen /lpf Final  . Mucus, UA 04/19/2017 Present  Not Estab. Final  . Bacteria, UA 04/19/2017 Few  None seen/Few Final  Physical Examination:  BP 140/72 (BP Location: Right Arm, Patient Position: Sitting, Cuff Size: Large)   Pulse 84   Ht 6\' 2"  (1.88 m)   Wt (!) 392 lb 9.6 oz (178.1 kg)   SpO2 98%   BMI 50.41 kg/m   GENERAL:         Patient has marked generalized obesity.    HEENT:         Eye exam shows normal external appearance.  Fundus exam shows no retinopathy.  Oral exam shows normal mucosa .  NECK:   There is no lymphadenopathy  Thyroid is not enlarged and no nodules felt.  Carotids are normal to palpation and no bruit heard  LUNGS:         Chest is symmetrical. Lungs are clear to auscultation.Marland Kitchen   HEART:         Heart sounds:  S1 and S2 are normal. No murmur or click heard., no S3 or S4.   ABDOMEN:   There is no distention present. Liver and spleen are not palpable. No other mass or tenderness present.    NEUROLOGICAL:  Biceps reflexes appear normal Left ankle reflex absent    Diabetic Foot Exam - Simple   Simple Foot Form Diabetic Foot exam was performed with the following findings:  Yes   Visual Inspection See comments:  Yes Sensation Testing Intact to touch and monofilament testing bilaterally:  Yes See comments:  Yes Pulse Check Posterior Tibialis and  Dorsalis pulse intact bilaterally:  Yes Comments  He has right above-knee amputation The following applied to the left foot. Has hammertoes of the left foot           . MUSCULOSKELETAL:  There is no swelling or deformity of the peripheral joints.  Right above-knee amputation present    EXTREMITIES:     There is no mild lower leg edema on the left.  SKIN:       No rash or lesions of concern.        ASSESSMENT:  Diabetes type 2, with morbid obesity  He has been only on insulin for several years with variable level of control Although his blood sugars were relatively better controlled and A1c was under 7 and a couple of times previously is blood sugars were poorly controlled and A1c had gone up to 7.9 in February This is despite taking large amounts of insulin especially Tyler Aas   More recently has been much more motivated to lose weight and improve his control with starting to go to the gym 5 days a week and exercising for 2 or 3 hours Also is following a very low carbohydrate diet Although difficult to know whether he is losing weight his blood sugars reportedly at home are mostly near normal However not checking fasting reading With his exercise in the morning he is tending to get hypoglycemic while continuing his NovoLog at breakfast  He has had minimal diabetes education and does not understand actions of basal and bolus insulins Currently likely not benefiting much from Tradjenta  Complications of diabetes: Nephropathy with renal insufficiency and proteinuria, unknown status of retinopathy, erectile dysfunction  Question of hypogonadism: Details are not available from previous evaluation and not clear if he had a free testosterone level checked Currently not symptomatic with unusual fatigue and is able to exercise His only complaint is difficulties with erectile function   PLAN:    He is a good candidate for medication like Ozempic to enable improved blood sugar control,  reduce insulin requirement and provide weight loss long-term Discussed with the patient the nature of GLP-1 drugs, the action on various organ systems, how they benefit blood glucose control, as well as the benefit of weight loss and  increase satiety . Explained possible side effects, particularly nausea and vomiting that usually resolve over time; discussed safety information in package insert. Demonstrated the medication injection device and injection technique to the patient.  Prescription for Ozempic has been sent To start with 0.25 mg dosage weekly for the first 2 weeks and then go to 0.5 mg weekly Patient brochure on Trulicity with enclosed co-pay card given  He will stop taking NovoLog in the morning  Reduce NovoLog by 5 units at lunch and dinner and further if postprandial readings are low  He will also need to check his blood sugars regularly before breakfast and reduce his Tresiba by 5 units every week if blood sugars are running below 90  Needs to check blood sugars after meals consistently and bring his monitor for download  If he is meeting criteria for Medicare he can use the freestyle Libre sensor also in the future  Check free testosterone and LH levels on his follow-up  Patient Instructions  Start OZEMPIC 0.25mg  using the pen as shown once weekly on the same day of the week.  AFTER   You may inject in the stomach, thigh or arm as indicated in the brochure given. If you have any difficulties using the pen see the video at CompPlans.co.za  You will feel fullness of the stomach with starting the medication and should try to keep the portions at meals small.  You may experience nausea in the first few days which usually gets better over time    On the third injection increase the dose to 0.5 mg if there is no nausea  If you have any questions or concerns please call the office   You may also talk to a nurse educator with [employer] at [phone] Useful website:  [email or web address]   Check blood sugars on waking up at least every other day  Also check blood sugars about 2 hours after a meal and do this after different meals by rotation  Recommended blood sugar levels on waking up is 90-120 and about 2 hours after meal is 120-160  Please bring your blood sugar monitor to each visit, thank you   NOVOLOG: Do not take this On the mornings that you are going for exercise  Also with starting OZEMPIC will reduce the dose of NovoLog to 20 units at lunch and suppertime and may need to reduce it by another 5 units if blood sugars after eating are still under 120  TRESIBA: Continue same dose but if your morning sugars are below 90 reduce the dose by 5 units every week  STOP Tradjenta         Counseling time on subjects discussed in assessment and plan sections is over 50% of today's 60 minute visit    [name] 04/24/2017, 8:51 PM   Note: This office note was prepared with Dragon voice recognition system technology. Any transcriptional errors that result from this process are unintentional.

## 2017-04-24 NOTE — Patient Instructions (Addendum)
Start OZEMPIC 0.25mg  using the pen as shown once weekly on the same day of the week.  AFTER   You may inject in the stomach, thigh or arm as indicated in the brochure given. If you have any difficulties using the pen see the video at CompPlans.co.za  You will feel fullness of the stomach with starting the medication and should try to keep the portions at meals small.  You may experience nausea in the first few days which usually gets better over time    On the third injection increase the dose to 0.5 mg if there is no nausea  If you have any questions or concerns please call the office   You may also talk to a nurse educator with [employer] at [phone] Useful website: [email or web address]   Check blood sugars on waking up at least every other day  Also check blood sugars about 2 hours after a meal and do this after different meals by rotation  Recommended blood sugar levels on waking up is 90-120 and about 2 hours after meal is 120-160  Please bring your blood sugar monitor to each visit, thank you   NOVOLOG: Do not take this On the mornings that you are going for exercise  Also with starting OZEMPIC will reduce the dose of NovoLog to 20 units at lunch and suppertime and may need to reduce it by another 5 units if blood sugars after eating are still under 120  TRESIBA: Continue same dose but if your morning sugars are below 90 reduce the dose by 5 units every week  STOP Tradjenta

## 2017-04-26 ENCOUNTER — Encounter: Payer: Medicare Other | Admitting: Internal Medicine

## 2017-04-30 NOTE — Progress Notes (Signed)
Internal Medicine Clinic Attending  Case discussed with Dr. Heber Zalma  at the time of the visit.  We reviewed the resident's history and exam and pertinent patient test results.  I agree with the assessment, diagnosis, and plan of care documented in the resident's note.  Oda Kilts, MD

## 2017-05-31 ENCOUNTER — Telehealth: Payer: Self-pay | Admitting: Internal Medicine

## 2017-05-31 NOTE — Telephone Encounter (Signed)
Pt is requesting a DME for a  Bariatric Walker thorough Through [facility].  Please advise if an order can be placed for this.

## 2017-06-01 ENCOUNTER — Other Ambulatory Visit: Payer: Self-pay | Admitting: Internal Medicine

## 2017-06-01 DIAGNOSIS — S78111A Complete traumatic amputation at level between right hip and knee, initial encounter: Secondary | ICD-10-CM

## 2017-06-01 NOTE — Telephone Encounter (Signed)
Order placed

## 2017-06-05 ENCOUNTER — Other Ambulatory Visit (INDEPENDENT_AMBULATORY_CARE_PROVIDER_SITE_OTHER): Payer: Medicare Other

## 2017-06-05 DIAGNOSIS — R7989 Other specified abnormal findings of blood chemistry: Secondary | ICD-10-CM | POA: Diagnosis not present

## 2017-06-05 DIAGNOSIS — E1165 Type 2 diabetes mellitus with hyperglycemia: Secondary | ICD-10-CM

## 2017-06-05 DIAGNOSIS — Z794 Long term (current) use of insulin: Secondary | ICD-10-CM | POA: Diagnosis not present

## 2017-06-05 LAB — LIPID PANEL
CHOL/HDL RATIO: 4
CHOLESTEROL: 150 mg/dL (ref 0–200)
HDL: 33.3 mg/dL — AB (ref >39.00)
LDL Cholesterol: 91 mg/dL (ref 0–99)
NonHDL: 116.28
TRIGLYCERIDES: 127 mg/dL (ref 0.0–149.0)
VLDL: 25.4 mg/dL (ref 0.0–40.0)

## 2017-06-05 LAB — BASIC METABOLIC PANEL
BUN: 49 mg/dL — ABNORMAL HIGH (ref 6–23)
CHLORIDE: 105 meq/L (ref 96–112)
CO2: 25 meq/L (ref 19–32)
CREATININE: 1.97 mg/dL — AB (ref 0.40–1.50)
Calcium: 9.2 mg/dL (ref 8.4–10.5)
GFR: 48.53 mL/min — ABNORMAL LOW (ref >60.00)
Glucose, Bld: 134 mg/dL — ABNORMAL HIGH (ref 70–99)
POTASSIUM: 3.8 meq/L (ref 3.5–5.1)
SODIUM: 139 meq/L (ref 135–145)

## 2017-06-05 LAB — HEMOGLOBIN A1C: HEMOGLOBIN A1C: 6.2 % (ref 4.6–6.5)

## 2017-06-05 LAB — LUTEINIZING HORMONE: LH: 6.67 m[IU]/mL (ref 1.50–9.30)

## 2017-06-05 LAB — TSH: TSH: 2.15 u[IU]/mL (ref 0.35–4.50)

## 2017-06-06 LAB — TESTOSTERONE, FREE, TOTAL, SHBG
Sex Hormone Binding: 27.5 nmol/L (ref 16.5–55.9)
TESTOSTERONE: 224 ng/dL — AB (ref 264–916)
Testosterone, Free: 8.3 pg/mL (ref 6.8–21.5)

## 2017-06-06 NOTE — Telephone Encounter (Signed)
Order was faxed to [facility] [phone].  Please have the patient call [phone] if any questions.

## 2017-06-07 NOTE — Progress Notes (Addendum)
Patient ID: [name], male   DOB: 24-Feb-1975, 42 y.o.   MRN: [id number]          Reason for Appointment: Consultation for Type 2 Diabetes  Referring physician: Jannette Fogo   History of Present Illness:          Date of diagnosis of type 2 diabetes mellitus:  2001      His weight is about 400 pounds at the time of diagnosis He does not know what his initial blood sugars and circumstances were but he apparently was started on insulin initially He was not able to tolerate metformin because of diarrhea Subsequently has been on insulin only, level of control not available but his A1c was significantly high at 12.8 in 2015  Background history:    Recent history:   INSULIN regimen is:  140 units Tresiba, NovoLog 0-25 U tid     Non-insulin hypoglycemic drugs the patient is taking are: None  His A1c was 7.9 done in 2/19, now it is 6.2   Current management, blood sugar patterns and problems identified:   He has been on Ozempic since his initial consultation in 04/2017 and has probably taken about 4 to 5 injection  Although he was told to try 0.25 for the first 4 weeks he appears to be taking 0.5 mg weekly on Tuesdays  He thinks he started having nausea and this persisted and over the last 2 weeks has had vomiting also; previously had no difficulty with taking Victoza  However even though he did not take the injection on Tuesday this week he still continues to have persistent vomiting  He has been told not to take NovoLog at least at breakfast and he says that since his appetite has been significantly decreased he has not taken NovoLog at all  He did not bring his monitor for download as directed and not clear how often he is checking his sugar  He still likes to exercise every morning for a couple of hours, recently not getting any hypoglycemia with skipping his NovoLog  Does not appear to have lost any weight  He has taken his Tyler Aas about the same dose of 140 and he thinks his  morning sugars are about 110-120, not clear if they are higher after meals        Side effects from medications have been: Diarrhea with metformin  Compliance with the medical regimen: Good Hypoglycemia:   As above  Glucose monitoring:  done?  3 times a day         Glucometer:   ACCU-CHEK  Readings not available today  Self-care: The diet that the patient has been following is: low carb meals, since March  2019   Typical meal intake: Breakfast is egg whites, some oatmeal and banana.  Usually small portions of meat or other protein at lunch and dinner and only a small amount of sweet potato at lunch or carbohydrate              Dietician visit, most recent: Unknown               Exercise:  Weights, resistance and some cardio at the gym at least 5 days a week, does 2 sessions of 1 hour each  Weight history:  Wt Readings from Last 3 Encounters:  06/08/17 (!) 392 lb (177.8 kg)  04/24/17 (!) 392 lb 9.6 oz (178.1 kg)  11/16/16 (!) 369 lb 1.6 oz (167.4 kg)    Glycemic control:   Lab Results  Component Value Date   HGBA1C 6.2 06/05/2017   HGBA1C 7.9 02/15/2017   HGBA1C 6.3 10/31/2016   Lab Results  Component Value Date   MICROALBUR 160.61 (H) 08/14/2013   LDLCALC 91 06/05/2017   CREATININE 1.97 (H) 06/05/2017   Lab Results  Component Value Date   MICRALBCREAT 818.2 (H) 08/14/2013    No results found for: FRUCTOSAMINE    Allergies as of 06/08/2017      Reactions   Reglan [metoclopramide] Other (See Comments)   Dysphoric reaction      Medication List        Accurate as of 06/08/17  9:45 AM. Always use your most recent med list.          ACCU-CHEK AVIVA device Use to check blood sugar 5 times daily. Diag code E10.51. Multiple insulin dependent   accu-chek softclix lancets Use to check blood sugars 5 times a day. Dx code:E11.9. Insulin dependent.   allopurinol 100 MG tablet Commonly known as:  ZYLOPRIM Take 2 tablets (200 mg total) by mouth daily.     Alpha-Lipoic Acid 200 MG Caps Take 1 capsule (200 mg total) by mouth daily.   amLODipine 5 MG tablet Commonly known as:  NORVASC Take 1 tablet (5 mg total) by mouth daily.   apixaban 5 MG Tabs tablet Commonly known as:  ELIQUIS Take 1 tablet (5 mg total) by mouth 2 (two) times daily.   B-12 2500 MCG Subl Place 2,500 mcg under the tongue daily.   calcitRIOL 0.25 MCG capsule Commonly known as:  ROCALTROL Take 1 capsule (0.25 mcg total) by mouth daily.   carvedilol 25 MG tablet Commonly known as:  COREG Take 1 tablet (25 mg total) by mouth 2 (two) times daily with a meal.   cloNIDine 0.2 MG tablet Commonly known as:  CATAPRES Take 1 tablet (0.2 mg total) by mouth 2 (two) times daily.   DOCOSAHEXAENOIC ACID PO Take 660 mg by mouth daily.   furosemide 40 MG tablet Commonly known as:  LASIX Take 2 tablets (80 mg total) by mouth 2 (two) times daily.   gabapentin 300 MG capsule Commonly known as:  NEURONTIN Take 1 capsule (300 mg total) by mouth 3 (three) times daily.   glucose blood test strip Commonly known as:  ACCU-CHEK AVIVA Use to check blood sugars 5 times a day. Dx code:E11.9. Insulin dependent.   hydrALAZINE 50 MG tablet Commonly known as:  APRESOLINE Take 50 mg by mouth 4 (four) times daily.   insulin aspart 100 UNIT/ML FlexPen Commonly known as:  NOVOLOG Inject 25 Units into the skin 3 (three) times daily.   Insulin Degludec 200 UNIT/ML Sopn Inject 140 Units into the skin daily.   linagliptin 5 MG Tabs tablet Commonly known as:  TRADJENTA Take 1 tablet (5 mg total) by mouth daily.   loratadine 10 MG tablet Commonly known as:  CLARITIN Take 10 mg by mouth daily as needed for allergies.   lovastatin 20 MG tablet Commonly known as:  MEVACOR Take 1 tablet (20 mg total) by mouth daily.   metolazone 2.5 MG tablet Commonly known as:  ZAROXOLYN Take 2.5 mg by mouth every other day.   Pen Needles 3/16" 31G X 5 MM Misc Use five times daily    polyethylene glycol packet Commonly known as:  MIRALAX / GLYCOLAX Take 17 g by mouth daily as needed for mild constipation.   Semaglutide 0.25 or 0.5 MG/DOSE Sopn Commonly known as:  OZEMPIC Inject 0.5 mg into the  skin once a week.   senna-docusate 8.6-50 MG tablet Commonly known as:  Senokot-S Take 1 tablet by mouth daily.   Zinc Gluconate 100 MG Tabs Take 2.2 tablets (220 mg total) by mouth daily.       Allergies:  Allergies  Allergen Reactions  . Reglan [Metoclopramide] Other (See Comments)    Dysphoric reaction    Past Medical History:  Diagnosis Date  . Acute venous embolism and thrombosis of deep vessels of proximal lower extremity ([place]) 07/19/2011  . Anemia   . Chest pain, neg MI, normal coronaries by cath 02/18/2013  . CKD (chronic kidney disease) stage 3, GFR 30-59 ml/min ([facility]) 02/19/2013  . Colon polyps   . Diabetic ulcer of right foot ([place])   . DVT (deep venous thrombosis) ([place]) 09/2002   patient reports additional DVTs in '06 & '11 (unconfirmed)  . GERD (gastroesophageal reflux disease)   . History of blood transfusion    "related to OR" (10/31/2016)  . Hyperlipidemia 02/19/2013  . Hypertension   . Nausea & vomiting    "constant for the last couple weeks" (10/31/2016)  . Nephrotic syndrome   . Obesity    BMI 44, weight 346 pounds 01/30/14  . Pulmonary embolism ([place]) 09/2002   treated with 6 months of warfarin  . Type I diabetes mellitus ([place]) dx'd 2001    Past Surgical History:  Procedure Laterality Date  . AMPUTATION Right 12/22/2013   Procedure: AMPUTATION BELOW KNEE;  Surgeon: [name], MD;  Location: [facility];  Service: Orthopedics;  Laterality: Right;  . AMPUTATION Right 02/19/2014   Procedure: RIGHT ABOVE KNEE AMPUTATION ;  Surgeon: [name], MD;  Location: [facility];  Service: Orthopedics;  Laterality: Right;  . CARDIAC CATHETERIZATION  02/18/2013   normal coronaries  . ESOPHAGOGASTRODUODENOSCOPY N/A 11/02/2016   Procedure:  ESOPHAGOGASTRODUODENOSCOPY (EGD);  Surgeon: [name], MD;  Location[facility];  Service: Endoscopy;  Laterality: N/A;  . I&D EXTREMITY Right 12/14/2013   Procedure: IRRIGATION AND DEBRIDEMENT RIGHT FOOT;  Surgeon: [name], MD;  Location: [place];  Service: Orthopedics;  Laterality: Right;  . INCISION AND DRAINAGE ABSCESS  2007; 2015   "back"  . INCISION AND DRAINAGE OF WOUND Right 12/22/2013   Procedure: I&D RIGHT BUTTOCK;  Surgeon: [name], MD;  Location: [place];  Service: Orthopedics;  Laterality: Right;  . LEFT HEART CATHETERIZATION WITH CORONARY ANGIOGRAM N/A 02/18/2013   Procedure: LEFT HEART CATHETERIZATION WITH CORONARY ANGIOGRAM;  Surgeon: [name], MD;  Location: [facility] CATH LAB;  Service: Cardiovascular;  Laterality: N/A;    Family History  Problem Relation Age of Onset  . Heart disease Mother   . Diabetes Mother   . Diabetes Maternal Uncle   . Diabetes Cousin     Social History:  reports that he has never smoked. He has never used smokeless tobacco. He reports that he drinks alcohol. He reports that he does not use drugs.   Review of Systems     VOMITING: He isCurrently having vomiting as discussed above and he thinks once he had coffee-ground emesis this week Today did not have any coffee grounds in his vomiting in the office today  LOW TESTOSTERONE: Earlier this year when he was in the rehab facility he was told that he had a low testosterone and was started on testosterone injections He was taking 50 mg every 2 weeks With this he thinks he felt more energetic However he stopped taking the injection because of fear of getting  blood clots which he has had several times.   Reportedly he has had normal prolactin levels and LH but no results are available  Free testosterone level is now normal  Lipid history: On Mevacor with good control    Lab Results  Component Value Date   CHOL 150 06/05/2017   HDL 33.30 (L) 06/05/2017   LDLCALC 91 06/05/2017    TRIG 127.0 06/05/2017   CHOLHDL 4 06/05/2017           Hypertension: He is taking clonidine, Norvasc and Coreg  BP Readings from Last 3 Encounters:  06/08/17 (!) 142/90  04/24/17 140/72  04/19/17 138/69    Most recent eye exam was in 2018  Most recent foot exam: 2/63   Complications of diabetes: Nephropathy with renal insufficiency and proteinuria, unknown status of retinopathy, erectile dysfunction  LABS:  Lab on 06/05/2017  Component Date Value Ref Range Status  . Cholesterol 06/05/2017 150  0 - 200 mg/dL Final   ATP III Classification       Desirable:  < 200 mg/dL               Borderline High:  200 - 239 mg/dL          High:  > = 240 mg/dL  . Triglycerides 06/05/2017 127.0  0.0 - 149.0 mg/dL Final   Normal:  <150 mg/dLBorderline High:  150 - 199 mg/dL  . HDL 06/05/2017 33.30* >39.00 mg/dL Final  . VLDL 06/05/2017 25.4  0.0 - 40.0 mg/dL Final  . LDL Cholesterol 06/05/2017 91  0 - 99 mg/dL Final  . Total CHOL/HDL Ratio 06/05/2017 4   Final                  Men          Women1/2 Average Risk     3.4          3.3Average Risk          5.0          4.42X Average Risk          9.6          7.13X Average Risk          15.0          11.0                      . NonHDL 06/05/2017 116.28   Final   NOTE:  Non-HDL goal should be 30 mg/dL higher than patient's LDL goal (i.e. LDL goal of < 70 mg/dL, would have non-HDL goal of < 100 mg/dL)  . TSH 06/05/2017 2.15  0.35 - 4.50 uIU/mL Final  . [place] 06/05/2017 6.67  1.50 - 9.30 mIU/mL Final   Comment: Male Reference Range:20-70 yrs     1.5-9.3 mIU/mL>70 yrs       3.1-35.6 mIU/mLFemale Reference Range:Follicular Phase     7.8-58.8 mIU/mLMidcycle             8.7-76.3 mIU/mLLuteal Phase         0.5-16.9 mIU/mL  Post Menopausal      15.9-54.0  mIU/mLPregnant             <1.5 mIU/mLContraceptives       0.7-5.6 mIU/mL   . Testosterone 06/05/2017 224* 264 - 916 ng/dL Final   Comment: Adult male reference interval is based on a population of healthy  nonobese males (BMI <30) between 43 and 35 years old. Travison, et.al.  JCEM 2017,102;1161-1173. PMID: 61683729.   Marland Kitchen Testosterone, Free 06/05/2017 8.3  6.8 - 21.5 pg/mL Final  . Sex Hormone Binding 06/05/2017 27.5  16.5 - 55.9 nmol/L Final  . Sodium 06/05/2017 139  135 - 145 mEq/L Final  . Potassium 06/05/2017 3.8  3.5 - 5.1 mEq/L Final  . Chloride 06/05/2017 105  96 - 112 mEq/L Final  . CO2 06/05/2017 25  19 - 32 mEq/L Final  . Glucose, Bld 06/05/2017 134* 70 - 99 mg/dL Final  . BUN 06/05/2017 49* 6 - 23 mg/dL Final  . Creatinine, Ser 06/05/2017 1.97* 0.40 - 1.50 mg/dL Final  . Calcium 06/05/2017 9.2  8.4 - 10.5 mg/dL Final  . GFR 06/05/2017 48.53* >60.00 mL/min Final  . Hgb A1c MFr Bld 06/05/2017 6.2  4.6 - 6.5 % Final   Glycemic Control Guidelines for People with Diabetes:Non Diabetic:  <6%Goal of Therapy: <7%Additional Action Suggested:  >8%     Physical Examination:  BP (!) 142/90 (BP Location: Left Arm, Patient Position: Sitting, Cuff Size: Normal)   Pulse 84   Ht 6\' 2"  (1.88 m)   Wt (!) 392 lb (177.8 kg)   SpO2 97%   BMI 50.33 kg/m       ASSESSMENT:  Diabetes type 2, with morbid obesity  See history of present illness for detailed discussion of current diabetes management, blood sugar patterns and problems identified  A1c is down to 6.2 He is now on a regimen of basal insulin, also Ozempic only His blood sugars appear to be significantly better with improved A1c  Although this is probably related to his adding Ozempic to his regimen he does not appear to be tolerating this with nausea and vomiting Has not taken any mealtime insulin Still continues to exercise regularly He is not apparently having high readings but did not bring his monitor and not sure if he is still having  hyperglycemia after meals without taking NovoLog at meals Most likely since he is eating much less what has decreased appetite does not need any NovoLog at the moment   Question of  hypogonadism: His free testosterone is normal  Continue vomiting, some coffee-ground emesis: He needs to be seen by his PCP or go to the emergency room for evaluation and treatment   PLAN:    He will not take Ozempic until his next visit  Check blood sugars consistently after meals and bring monitor for download on each visit  May consider trying Victoza and graduated doses on the next visit if having high postprandial readings, he has tolerated this before  We will continue follow-up with his PCP for other issues  There are no Patient Instructions on file for this visit.     [name] 06/08/2017, 9:45 AM   Note: This office note was prepared with Dragon voice recognition system technology. Any transcriptional errors that result from this process are unintentional.

## 2017-06-08 ENCOUNTER — Other Ambulatory Visit: Payer: Self-pay

## 2017-06-08 ENCOUNTER — Emergency Department (HOSPITAL_COMMUNITY): Payer: Medicare Other

## 2017-06-08 ENCOUNTER — Encounter (HOSPITAL_COMMUNITY): Payer: Self-pay | Admitting: Emergency Medicine

## 2017-06-08 ENCOUNTER — Ambulatory Visit (INDEPENDENT_AMBULATORY_CARE_PROVIDER_SITE_OTHER): Payer: Medicare Other | Admitting: Endocrinology

## 2017-06-08 ENCOUNTER — Observation Stay (HOSPITAL_COMMUNITY)
Admission: EM | Admit: 2017-06-08 | Discharge: 2017-06-10 | Disposition: A | Discharge status: Home / Self Care | Payer: Medicare Other | Attending: Internal Medicine | Admitting: Internal Medicine

## 2017-06-08 ENCOUNTER — Encounter: Payer: Self-pay | Admitting: Endocrinology

## 2017-06-08 VITALS — BP 142/90 | HR 84 | Ht 74.0 in | Wt 392.0 lb

## 2017-06-08 DIAGNOSIS — E10621 Type 1 diabetes mellitus with foot ulcer: Secondary | ICD-10-CM | POA: Diagnosis not present

## 2017-06-08 DIAGNOSIS — E1051 Type 1 diabetes mellitus with diabetic peripheral angiopathy without gangrene: Secondary | ICD-10-CM

## 2017-06-08 DIAGNOSIS — E1165 Type 2 diabetes mellitus with hyperglycemia: Secondary | ICD-10-CM

## 2017-06-08 DIAGNOSIS — I129 Hypertensive chronic kidney disease with stage 1 through stage 4 chronic kidney disease, or unspecified chronic kidney disease: Secondary | ICD-10-CM | POA: Insufficient documentation

## 2017-06-08 DIAGNOSIS — IMO0002 Reserved for concepts with insufficient information to code with codable children: Secondary | ICD-10-CM

## 2017-06-08 DIAGNOSIS — D631 Anemia in chronic kidney disease: Secondary | ICD-10-CM | POA: Insufficient documentation

## 2017-06-08 DIAGNOSIS — E785 Hyperlipidemia, unspecified: Secondary | ICD-10-CM | POA: Insufficient documentation

## 2017-06-08 DIAGNOSIS — Z6841 Body Mass Index (BMI) 40.0 and over, adult: Secondary | ICD-10-CM | POA: Diagnosis not present

## 2017-06-08 DIAGNOSIS — E1065 Type 1 diabetes mellitus with hyperglycemia: Secondary | ICD-10-CM | POA: Diagnosis not present

## 2017-06-08 DIAGNOSIS — Z79899 Other long term (current) drug therapy: Secondary | ICD-10-CM | POA: Insufficient documentation

## 2017-06-08 DIAGNOSIS — K219 Gastro-esophageal reflux disease without esophagitis: Secondary | ICD-10-CM | POA: Diagnosis not present

## 2017-06-08 DIAGNOSIS — E1022 Type 1 diabetes mellitus with diabetic chronic kidney disease: Secondary | ICD-10-CM | POA: Diagnosis not present

## 2017-06-08 DIAGNOSIS — Z794 Long term (current) use of insulin: Secondary | ICD-10-CM

## 2017-06-08 DIAGNOSIS — E860 Dehydration: Secondary | ICD-10-CM | POA: Diagnosis not present

## 2017-06-08 DIAGNOSIS — I10 Essential (primary) hypertension: Secondary | ICD-10-CM | POA: Diagnosis present

## 2017-06-08 DIAGNOSIS — Z89511 Acquired absence of right leg below knee: Secondary | ICD-10-CM | POA: Diagnosis not present

## 2017-06-08 DIAGNOSIS — N3944 Nocturnal enuresis: Secondary | ICD-10-CM | POA: Insufficient documentation

## 2017-06-08 DIAGNOSIS — Z86711 Personal history of pulmonary embolism: Secondary | ICD-10-CM | POA: Insufficient documentation

## 2017-06-08 DIAGNOSIS — R112 Nausea with vomiting, unspecified: Principal | ICD-10-CM | POA: Diagnosis present

## 2017-06-08 DIAGNOSIS — Z888 Allergy status to other drugs, medicaments and biological substances status: Secondary | ICD-10-CM | POA: Insufficient documentation

## 2017-06-08 DIAGNOSIS — E10649 Type 1 diabetes mellitus with hypoglycemia without coma: Secondary | ICD-10-CM | POA: Insufficient documentation

## 2017-06-08 DIAGNOSIS — K5900 Constipation, unspecified: Secondary | ICD-10-CM | POA: Insufficient documentation

## 2017-06-08 DIAGNOSIS — N049 Nephrotic syndrome with unspecified morphologic changes: Secondary | ICD-10-CM

## 2017-06-08 DIAGNOSIS — N183 Chronic kidney disease, stage 3 unspecified: Secondary | ICD-10-CM | POA: Diagnosis present

## 2017-06-08 DIAGNOSIS — Z86718 Personal history of other venous thrombosis and embolism: Secondary | ICD-10-CM | POA: Diagnosis not present

## 2017-06-08 DIAGNOSIS — Z9114 Patient's other noncompliance with medication regimen: Secondary | ICD-10-CM | POA: Insufficient documentation

## 2017-06-08 DIAGNOSIS — K3184 Gastroparesis: Secondary | ICD-10-CM | POA: Diagnosis not present

## 2017-06-08 DIAGNOSIS — E1043 Type 1 diabetes mellitus with diabetic autonomic (poly)neuropathy: Secondary | ICD-10-CM | POA: Diagnosis not present

## 2017-06-08 DIAGNOSIS — Z9119 Patient's noncompliance with other medical treatment and regimen: Secondary | ICD-10-CM | POA: Insufficient documentation

## 2017-06-08 DIAGNOSIS — N179 Acute kidney failure, unspecified: Secondary | ICD-10-CM | POA: Diagnosis not present

## 2017-06-08 DIAGNOSIS — R809 Proteinuria, unspecified: Secondary | ICD-10-CM | POA: Diagnosis not present

## 2017-06-08 DIAGNOSIS — K8020 Calculus of gallbladder without cholecystitis without obstruction: Secondary | ICD-10-CM | POA: Insufficient documentation

## 2017-06-08 DIAGNOSIS — Z86010 Personal history of colonic polyps: Secondary | ICD-10-CM | POA: Insufficient documentation

## 2017-06-08 DIAGNOSIS — K859 Acute pancreatitis without necrosis or infection, unspecified: Secondary | ICD-10-CM | POA: Diagnosis not present

## 2017-06-08 DIAGNOSIS — Z7901 Long term (current) use of anticoagulants: Secondary | ICD-10-CM | POA: Insufficient documentation

## 2017-06-08 DIAGNOSIS — N184 Chronic kidney disease, stage 4 (severe): Secondary | ICD-10-CM | POA: Diagnosis present

## 2017-06-08 DIAGNOSIS — E119 Type 2 diabetes mellitus without complications: Secondary | ICD-10-CM | POA: Diagnosis present

## 2017-06-08 LAB — COMPREHENSIVE METABOLIC PANEL
ALT: 19 U/L (ref 17–63)
ANION GAP: 8 (ref 5–15)
AST: 18 U/L (ref 15–41)
Albumin: 2.9 g/dL — ABNORMAL LOW (ref 3.5–5.0)
Alkaline Phosphatase: 111 U/L (ref 38–126)
BUN: 38 mg/dL — ABNORMAL HIGH (ref 6–20)
CHLORIDE: 103 mmol/L (ref 101–111)
CO2: 26 mmol/L (ref 22–32)
CREATININE: 2.22 mg/dL — AB (ref 0.61–1.24)
Calcium: 8.6 mg/dL — ABNORMAL LOW (ref 8.9–10.3)
GFR calc non Af Amer: 35 mL/min — ABNORMAL LOW (ref >60)
GFR, EST AFRICAN AMERICAN: 41 mL/min — AB (ref >60)
Glucose, Bld: 387 mg/dL — ABNORMAL HIGH (ref 65–99)
POTASSIUM: 4.4 mmol/L (ref 3.5–5.1)
Sodium: 137 mmol/L (ref 135–145)
Total Bilirubin: 0.4 mg/dL (ref 0.3–1.2)
Total Protein: 6.4 g/dL — ABNORMAL LOW (ref 6.5–8.1)

## 2017-06-08 LAB — URINALYSIS, ROUTINE W REFLEX MICROSCOPIC
BILIRUBIN URINE: NEGATIVE
Glucose, UA: >=500 mg/dL — AB
KETONES UR: NEGATIVE mg/dL
LEUKOCYTES UA: NEGATIVE
Nitrite: NEGATIVE
PH: 5 (ref 5.0–8.0)
Specific Gravity, Urine: 1.014 (ref 1.005–1.030)

## 2017-06-08 LAB — LIPASE, BLOOD: LIPASE: 87 U/L — AB (ref 11–51)

## 2017-06-08 LAB — CBC
HCT: 36.8 % — ABNORMAL LOW (ref 39.0–52.0)
HEMOGLOBIN: 11.8 g/dL — AB (ref 13.0–17.0)
MCH: 27.9 pg (ref 26.0–34.0)
MCHC: 32.1 g/dL (ref 30.0–36.0)
MCV: 87 fL (ref 78.0–100.0)
PLATELETS: 250 10*3/uL (ref 150–400)
RBC: 4.23 MIL/uL (ref 4.22–5.81)
RDW: 13.1 % (ref 11.5–15.5)
WBC: 8.1 10*3/uL (ref 4.0–10.5)

## 2017-06-08 LAB — GLUCOSE, CAPILLARY: Glucose-Capillary: 265 mg/dL — ABNORMAL HIGH (ref 65–99)

## 2017-06-08 LAB — CBG MONITORING, ED: Glucose-Capillary: 261 mg/dL — ABNORMAL HIGH (ref 65–99)

## 2017-06-08 MED ORDER — PROMETHAZINE HCL 25 MG/ML IJ SOLN
12.5000 mg | Freq: Four times a day (QID) | INTRAMUSCULAR | Status: DC | PRN
  Administered 2017-06-09 – 2017-06-10 (×5): 12.5 mg via INTRAVENOUS
  Filled 2017-06-08 (×5): qty 1

## 2017-06-08 MED ORDER — CALCITRIOL 0.25 MCG PO CAPS: 0.2500 ug | ORAL_CAPSULE | ORAL | Status: DC

## 2017-06-08 MED ORDER — ONDANSETRON HCL 4 MG/2ML IJ SOLN
4.0000 mg | Freq: Once | INTRAMUSCULAR | Status: AC
  Administered 2017-06-08: 4 mg via INTRAVENOUS
  Filled 2017-06-08: qty 2

## 2017-06-08 MED ORDER — MORPHINE SULFATE (PF) 4 MG/ML IV SOLN
4.0000 mg | Freq: Once | INTRAVENOUS | Status: AC
  Administered 2017-06-08: 4 mg via INTRAVENOUS
  Filled 2017-06-08: qty 1

## 2017-06-08 MED ORDER — SODIUM CHLORIDE 0.9 % IV BOLUS
1000.0000 mL | Freq: Once | INTRAVENOUS | Status: AC
  Administered 2017-06-08: 1000 mL via INTRAVENOUS

## 2017-06-08 MED ORDER — GABAPENTIN 300 MG PO CAPS
300.0000 mg | ORAL_CAPSULE | Freq: Three times a day (TID) | ORAL | Status: DC
  Administered 2017-06-09 – 2017-06-10 (×6): 300 mg via ORAL
  Filled 2017-06-08 (×6): qty 1

## 2017-06-08 MED ORDER — POLYETHYLENE GLYCOL 3350 17 G PO PACK: 17.0000 g | PACK | Freq: Every day | ORAL | Status: DC | PRN

## 2017-06-08 MED ORDER — INSULIN GLARGINE 100 UNIT/ML ~~LOC~~ SOLN: 70.0000 [IU] | Freq: Every day | SUBCUTANEOUS | Status: DC

## 2017-06-08 MED ORDER — INSULIN GLARGINE 100 UNIT/ML ~~LOC~~ SOLN
70.0000 [IU] | Freq: Every day | SUBCUTANEOUS | Status: DC
  Administered 2017-06-09 – 2017-06-10 (×3): 70 [IU] via SUBCUTANEOUS
  Filled 2017-06-08 (×3): qty 0.7

## 2017-06-08 MED ORDER — PROMETHAZINE HCL 25 MG PO TABS
12.5000 mg | ORAL_TABLET | Freq: Four times a day (QID) | ORAL | Status: DC | PRN
  Filled 2017-06-08: qty 1

## 2017-06-08 MED ORDER — APIXABAN 5 MG PO TABS
5.0000 mg | ORAL_TABLET | Freq: Two times a day (BID) | ORAL | Status: DC
Start: 2017-06-08 — End: 2017-06-10
  Administered 2017-06-09 – 2017-06-10 (×4): 5 mg via ORAL
  Filled 2017-06-08 (×4): qty 1

## 2017-06-08 MED ORDER — INSULIN ASPART 100 UNIT/ML ~~LOC~~ SOLN: 0.0000 [IU] | Freq: Three times a day (TID) | SUBCUTANEOUS | Status: DC

## 2017-06-08 MED ORDER — SENNOSIDES-DOCUSATE SODIUM 8.6-50 MG PO TABS: 1.0000 | ORAL_TABLET | Freq: Every evening | ORAL | Status: DC | PRN

## 2017-06-08 MED ORDER — CARVEDILOL 25 MG PO TABS
25.0000 mg | ORAL_TABLET | Freq: Two times a day (BID) | ORAL | Status: DC
  Administered 2017-06-09 – 2017-06-10 (×5): 25 mg via ORAL
  Filled 2017-06-08 (×5): qty 1

## 2017-06-08 MED ORDER — SODIUM CHLORIDE 0.9 % IV SOLN
INTRAVENOUS | Status: DC
  Administered 2017-06-08: 19:00:00 via INTRAVENOUS

## 2017-06-08 MED ORDER — AMLODIPINE BESYLATE 5 MG PO TABS
5.0000 mg | ORAL_TABLET | Freq: Every day | ORAL | Status: DC
  Administered 2017-06-08 – 2017-06-10 (×3): 5 mg via ORAL
  Filled 2017-06-08 (×3): qty 1

## 2017-06-08 MED ORDER — ALLOPURINOL 100 MG PO TABS
200.0000 mg | ORAL_TABLET | Freq: Every day | ORAL | Status: DC
  Administered 2017-06-09 – 2017-06-10 (×2): 200 mg via ORAL
  Filled 2017-06-08 (×2): qty 2

## 2017-06-08 MED ORDER — INSULIN ASPART 100 UNIT/ML ~~LOC~~ SOLN
0.0000 [IU] | SUBCUTANEOUS | Status: DC
  Administered 2017-06-09: 3 [IU] via SUBCUTANEOUS
  Administered 2017-06-09: 8 [IU] via SUBCUTANEOUS
  Administered 2017-06-09: 3 [IU] via SUBCUTANEOUS
  Administered 2017-06-09: 5 [IU] via SUBCUTANEOUS
  Administered 2017-06-09 – 2017-06-10 (×2): 3 [IU] via SUBCUTANEOUS

## 2017-06-08 MED ORDER — VITAMIN B-12 1000 MCG PO TABS
2000.0000 ug | ORAL_TABLET | Freq: Every day | ORAL | Status: DC
Start: 2017-06-09 — End: 2017-06-10
  Administered 2017-06-09 – 2017-06-10 (×2): 2000 ug via ORAL
  Filled 2017-06-08 (×2): qty 2

## 2017-06-08 MED ORDER — B-12 2500 MCG SL SUBL: 2500.0000 ug | SUBLINGUAL_TABLET | Freq: Every day | SUBLINGUAL | Status: DC

## 2017-06-08 MED ORDER — CARVEDILOL 25 MG PO TABS: 25.0000 mg | ORAL_TABLET | Freq: Two times a day (BID) | ORAL | Status: DC

## 2017-06-08 MED ORDER — CLONIDINE HCL 0.2 MG PO TABS
0.2000 mg | ORAL_TABLET | Freq: Two times a day (BID) | ORAL | Status: DC
Start: 2017-06-08 — End: 2017-06-10
  Administered 2017-06-08 – 2017-06-09 (×3): 0.2 mg via ORAL
  Filled 2017-06-08 (×3): qty 1

## 2017-06-08 MED ORDER — ACETAMINOPHEN 325 MG PO TABS: 650.0000 mg | ORAL_TABLET | Freq: Four times a day (QID) | ORAL | Status: DC | PRN

## 2017-06-08 MED ORDER — INSULIN DEGLUDEC 200 UNIT/ML ~~LOC~~ SOPN: 70.0000 [IU] | PEN_INJECTOR | Freq: Every day | SUBCUTANEOUS | Status: DC

## 2017-06-08 MED ORDER — ACETAMINOPHEN 650 MG RE SUPP: 650.0000 mg | Freq: Four times a day (QID) | RECTAL | Status: DC | PRN

## 2017-06-08 NOTE — ED Provider Notes (Signed)
[facility] EMERGENCY DEPARTMENT Provider Note   CSN: [id number] Arrival date & time: 06/08/17  1054     History   Chief Complaint Chief Complaint  Patient presents with  . Abdominal Pain    HPI [name] is a 42 y.o. male.  Patient is a 42 year old male with a history of type 1 diabetes on insulin, CKD, DVT and PE on anticoagulation, hypertension who is presenting today with 2 weeks of worsening abdominal pain, nausea, vomiting and diarrhea.  Patient states that 2 weeks ago the pain was waxing and waning but for the last 1 week he has had constant pain that is only worsening.  Now anytime he tries to eat or drink anything he will vomit.  His blood sugars have remained stable.  He saw his endocrinologist today who recommended he come here for further care.  Last bowel movement was on Wednesday and it was a combination of formed and loose stool.  He denies fever, recent antibiotics or travel outside the country.  He states the chronic swelling in his left lower extremity is improved from baseline.  He has not been taking his medications because he vomits them up.  No prior abdominal surgeries.  The history is provided by the patient.    Past Medical History:  Diagnosis Date  . Acute venous embolism and thrombosis of deep vessels of proximal lower extremity ([place]) 07/19/2011  . Anemia   . Chest pain, neg MI, normal coronaries by cath 02/18/2013  . CKD (chronic kidney disease) stage 3, GFR 30-59 ml/min (HCC) 02/19/2013  . Colon polyps   . Diabetic ulcer of right foot ([place])   . DVT (deep venous thrombosis) ([place]) 09/2002   patient reports additional DVTs in '06 & '11 (unconfirmed)  . GERD (gastroesophageal reflux disease)   . History of blood transfusion    "related to OR" (10/31/2016)  . Hyperlipidemia 02/19/2013  . Hypertension   . Nausea & vomiting    "constant for the last couple weeks" (10/31/2016)  . Nephrotic syndrome   . Obesity    BMI 44, weight 346  pounds 01/30/14  . Pulmonary embolism ([place]) 09/2002   treated with 6 months of warfarin  . Type I diabetes mellitus ([place]) dx'd 2001    Patient Active Problem List   Diagnosis Date Noted  . Erectile dysfunction 04/22/2017  . Nocturnal enuresis 04/19/2017  . Blood in stool 03/07/2017  . History of nausea and vomiting 10/31/2016  . Prosthesis adjustment 08/17/2016  . History of pulmonary embolus (PE) 06/09/2016  . Normocytic anemia 01/30/2014  . Constipation   . Above knee amputation of right lower extremity ([place]) 12/26/2013  . GERD (gastroesophageal reflux disease) 08/14/2013  . Hyperlipidemia 02/19/2013  . S/P cardiac cath, 02/18/13, normal coronaries 02/19/2013  . CKD (chronic kidney disease) stage 3, GFR 30-59 ml/min (HCC) 02/19/2013  . Nephrotic syndrome 02/12/2012  . Hypertension 07/24/2011  . Long term current use of anticoagulant therapy 07/19/2011  . Back pain 07/12/2011  . History of DVT (deep vein thrombosis) 07/28/2009  . PROTEINURIA, MILD 02/01/2007  . Morbid obesity ([place]) 10/26/2005  . DM (diabetes mellitus) type I uncontrolled, periph vascular disorder ([place]) 01/09/2002    Past Surgical History:  Procedure Laterality Date  . AMPUTATION Right 12/22/2013   Procedure: AMPUTATION BELOW KNEE;  Surgeon: [name], MD;  Location: [facility];  Service: Orthopedics;  Laterality: Right;  . AMPUTATION Right 02/19/2014   Procedure: RIGHT ABOVE KNEE AMPUTATION ;  Surgeon: [name], MD;  Location: [place] [place] [facility];  Service: Orthopedics;  Laterality: Right;  . CARDIAC CATHETERIZATION  02/18/2013   normal coronaries  . ESOPHAGOGASTRODUODENOSCOPY N/A 11/02/2016   Procedure: ESOPHAGOGASTRODUODENOSCOPY (EGD);  Surgeon: [name], MD;  Location: [facility] ENDOSCOPY;  Service: Endoscopy;  Laterality: N/A;  . I&D EXTREMITY Right 12/14/2013   Procedure: IRRIGATION AND DEBRIDEMENT RIGHT FOOT;  Surgeon: [name], MD;  Location: [facility];  Service: Orthopedics;  Laterality: Right;  . INCISION AND  DRAINAGE ABSCESS  2007; 2015   "back"  . INCISION AND DRAINAGE OF WOUND Right 12/22/2013   Procedure: I&D RIGHT BUTTOCK;  Surgeon: [name], MD;  Location: [facility];  Service: Orthopedics;  Laterality: Right;  . LEFT HEART CATHETERIZATION WITH CORONARY ANGIOGRAM N/A 02/18/2013   Procedure: LEFT HEART CATHETERIZATION WITH CORONARY ANGIOGRAM;  Surgeon: [name], MD;  Location: [facility] CATH LAB;  Service: Cardiovascular;  Laterality: N/A;        Home Medications    Prior to Admission medications   Medication Sig Start Date End Date Taking? Authorizing Provider  allopurinol (ZYLOPRIM) 100 MG tablet Take 2 tablets (200 mg total) by mouth daily. 08/03/16   Kalman Shan Ratliff, DO  Alpha-Lipoic Acid 200 MG CAPS Take 1 capsule (200 mg total) by mouth daily. 08/17/16   Kalman Shan Ratliff, DO  amLODipine (NORVASC) 5 MG tablet Take 1 tablet (5 mg total) by mouth daily. 08/17/16 08/17/17  Kalman Shan Ratliff, DO  apixaban (ELIQUIS) 5 MG TABS tablet Take 1 tablet (5 mg total) by mouth 2 (two) times daily. 08/11/16   Axel Filler, MD  Blood Glucose Monitoring Suppl (ACCU-CHEK AVIVA) device Use to check blood sugar 5 times daily. Diag code E10.51. Multiple insulin dependent 08/11/16 08/11/17  Axel Filler, MD  calcitRIOL (ROCALTROL) 0.25 MCG capsule Take 1 capsule (0.25 mcg total) by mouth daily. Patient taking differently: Take 0.25 mcg by mouth daily.  08/11/16   Axel Filler, MD  carvedilol (COREG) 25 MG tablet Take 1 tablet (25 mg total) by mouth 2 (two) times daily with a meal. 11/02/16   Alphonzo Grieve, MD  cloNIDine (CATAPRES) 0.2 MG tablet Take 1 tablet (0.2 mg total) by mouth 2 (two) times daily. 08/17/16 08/17/17  Kalman Shan Ratliff, DO  Cyanocobalamin (B-12) 2500 MCG SUBL Place 2,500 mcg under the tongue daily.    [provider]  DOCOSAHEXAENOIC ACID PO Take 660 mg by mouth daily.    [provider]  furosemide (LASIX) 40 MG tablet Take 2  tablets (80 mg total) by mouth 2 (two) times daily. 08/11/16   Axel Filler, MD  gabapentin (NEURONTIN) 300 MG capsule Take 1 capsule (300 mg total) by mouth 3 (three) times daily. 08/11/16   Axel Filler, MD  glucose blood (ACCU-CHEK AVIVA) test strip Use to check blood sugars 5 times a day. Dx code:E11.9. Insulin dependent. 08/14/16   Kalman Shan Ratliff, DO  hydrALAZINE (APRESOLINE) 50 MG tablet Take 50 mg by mouth 4 (four) times daily.    [provider]  insulin aspart (NOVOLOG) 100 UNIT/ML FlexPen Inject 25 Units into the skin 3 (three) times daily. 08/11/16 08/11/17  Axel Filler, MD  Insulin Degludec 200 UNIT/ML SOPN Inject 140 Units into the skin daily. 08/11/16   Axel Filler, MD  Insulin Pen Needle (PEN NEEDLES 3/16") 31G X 5 MM MISC Use five times daily 08/14/16   [name], DO  Lancet Devices [facility]) lancets Use to check blood sugars 5 times  a day. Dx code:E11.9. Insulin dependent. 08/14/16   Kalman Shan Ratliff, DO  linagliptin (TRADJENTA) 5 MG TABS tablet Take 1 tablet (5 mg total) by mouth daily. 08/11/16   Axel Filler, MD  loratadine (CLARITIN) 10 MG tablet Take 10 mg by mouth daily as needed for allergies.     [provider]  lovastatin (MEVACOR) 20 MG tablet Take 1 tablet (20 mg total) by mouth daily. 06/08/16 06/08/17  Velna Ochs, MD  metolazone (ZAROXOLYN) 2.5 MG tablet Take 2.5 mg by mouth every other day.    [provider]  polyethylene glycol (MIRALAX / GLYCOLAX) packet Take 17 g by mouth daily as needed for mild constipation.     [provider]  Semaglutide (OZEMPIC) 0.25 or 0.5 MG/DOSE SOPN Inject 0.5 mg into the skin once a week. Patient not taking: Reported on 06/08/2017 04/24/17   [name], MD  senna-docusate (SENOKOT-S) 8.6-50 MG tablet Take 1 tablet by mouth daily. 02/15/17   [name], DO  Zinc Gluconate 100 MG TABS Take 2.2 tablets (220 mg  total) by mouth daily. Patient taking differently: Take 220 mg by mouth daily.  08/17/16   [name], DO    Family History Family History  Problem Relation Age of Onset  . Heart disease Mother   . Diabetes Mother   . Diabetes Maternal Uncle   . Diabetes Cousin     Social History Social History   Tobacco Use  . Smoking status: Never Smoker  . Smokeless tobacco: Never Used  Substance Use Topics  . Alcohol use: Yes    Comment: 10/31/2016 "a few beers q 6 months or so"  . Drug use: No     Allergies   Reglan [metoclopramide]   Review of Systems Review of Systems  All other systems reviewed and are negative.    Physical Exam Updated Vital Signs BP (!) 192/88 (BP Location: Right Arm)   Pulse 76   Temp 98.5 F (36.9 C) (Oral)   Resp 16   SpO2 100%   Physical Exam  Constitutional: He is oriented to person, place, and time. He appears well-developed and well-nourished. No distress.  HENT:  Head: Normocephalic and atraumatic.  Mouth/Throat: Oropharynx is clear and moist.  Dry mucous membranes.  Eyes: Pupils are equal, round, and reactive to light. Conjunctivae and EOM are normal.  Neck: Normal range of motion. Neck supple.  Cardiovascular: Normal rate, regular rhythm and intact distal pulses.  No murmur heard. Pulmonary/Chest: Effort normal and breath sounds normal. No respiratory distress. He has no wheezes. He has no rales.  Abdominal: Soft. He exhibits no distension. There is generalized tenderness. There is guarding. There is no rebound and no CVA tenderness.  Worse pain in the upper quadrants  Musculoskeletal: Normal range of motion. He exhibits no edema or tenderness.  Neurological: He is alert and oriented to person, place, and time.  Skin: Skin is warm and dry. No rash noted. No erythema.  Psychiatric: He has a normal mood and affect. His behavior is normal.  Nursing note and vitals reviewed.    ED Treatments / Results  Labs (all labs  ordered are listed, but only abnormal results are displayed) Labs Reviewed  LIPASE, BLOOD - Abnormal; Notable for the following components:      Result Value   Lipase 87 (*)    All other components within normal limits  COMPREHENSIVE METABOLIC PANEL - Abnormal; Notable for the following components:   Glucose, Bld 387 (*)  BUN 38 (*)    Creatinine, Ser 2.22 (*)    Calcium 8.6 (*)    Total Protein 6.4 (*)    Albumin 2.9 (*)    GFR calc non Af Amer 35 (*)    GFR calc Af Amer 41 (*)    All other components within normal limits  CBC - Abnormal; Notable for the following components:   Hemoglobin 11.8 (*)    HCT 36.8 (*)    All other components within normal limits  URINALYSIS, ROUTINE W REFLEX MICROSCOPIC - Abnormal; Notable for the following components:   Glucose, UA >=500 (*)    Hgb urine dipstick MODERATE (*)    Protein, ur >=300 (*)    Bacteria, UA RARE (*)    All other components within normal limits    EKG None  Radiology Ct Abdomen Pelvis Wo Contrast  Result Date: 06/08/2017 CLINICAL DATA:  Nausea/vomiting and abdominal pain x2 weeks EXAM: CT ABDOMEN AND PELVIS WITHOUT CONTRAST TECHNIQUE: Multidetector CT imaging of the abdomen and pelvis was performed following the standard protocol without IV contrast. COMPARISON:  10/24/2016 FINDINGS: Lower chest: Lung bases are clear. Hepatobiliary: Unenhanced liver is unremarkable. Layering gallstones (series 3/image 30), without associated inflammatory changes. No intrahepatic or extrahepatic ductal dilatation. Pancreas: Within normal limits. No convincing peripancreatic inflammatory changes on CT. No peripancreatic fluid collections/pseudocysts. Spleen: Within normal limits. Adrenals/Urinary Tract: Adrenal glands are within normal limits. Kidneys are within normal limits. No renal, ureteral, or bladder calculi. No hydronephrosis. Mildly thick-walled bladder. Stomach/Bowel: Stomach is within normal limits. No evidence of bowel obstruction.  Normal appendix (series 3/image 81). Vascular/Lymphatic: No evidence of abdominal aortic aneurysm. No suspicious abdominopelvic lymphadenopathy. Reproductive: Prostate is grossly unremarkable. Other: No abdominopelvic ascites. Musculoskeletal: Mild degenerative changes of the lower thoracic spine. IMPRESSION: No CT findings to confirm the diagnosis of acute pancreatitis. No peripancreatic fluid collections/pseudocysts. Cholelithiasis, without associated inflammatory changes. Electronically Signed   By: Julian Hy M.D.   On: 06/08/2017 20:00    Procedures Procedures (including critical care time)  Medications Ordered in ED Medications  sodium chloride 0.9 % bolus 1,000 mL (has no administration in time range)  morphine 4 MG/ML injection 4 mg (has no administration in time range)  0.9 %  sodium chloride infusion (has no administration in time range)  ondansetron (ZOFRAN) injection 4 mg (has no administration in time range)     Initial Impression / Assessment and Plan / ED Course  I have reviewed the triage vital signs and the nursing notes.  Pertinent labs & imaging results that were available during my care of the patient were reviewed by me and considered in my medical decision making (see chart for details).    Patient is a 42 year old male with multiple medical problems including diabetes and chronic kidney disease presenting today with 2 weeks of worsening abdominal pain and vomiting.  Patient was seen by his endocrinologist today for a follow-up as he recently started a new medication and they felt that the medication would not be causing symptoms this long.  They recommend he come here for further evaluation.  He denies any infectious symptoms.  He is vomiting now every time he tries to eat or drink.  He has diffuse pain but worse in the upper quadrants.  UA without significant findings, patient's lipase today is elevated at 87, mild AKI with creatinine of 2.2 and elevated BUN which  is most likely prerenal.  CBC without acute findings. Concern for pancreatitis versus diabetic gastroparesis.  Lower  suspicion for gallbladder disease as patient's T bili and LFTs are within normal limits.  Lower suspicion for appendicitis or diverticulitis.  We will do a noncontrast CT for further evaluation.  Patient given IV fluids, pain and nausea control.  Patient's anion gap is 8 and no signs of DKA at this time.  8:30 PM  Patient CT shows no findings of acute pancreatitis.  There is evidence of cholelithiasis without associated inflammatory changes.  On reevaluation patient is still feeling somewhat nauseated and does not feel like he can eat or drink at this time.  We will continue IV hydration and admit for observation to ensure patient's lipase is improving and he is able to tolerate p.o.'s prior to discharge home.  Final Clinical Impressions(s) / ED Diagnoses   Final diagnoses:  Dehydration  Acute pancreatitis without infection or necrosis, unspecified pancreatitis type    ED Discharge Orders    None       Blanchie Dessert, MD 06/08/17 2031

## 2017-06-08 NOTE — H&P (Signed)
Date: 06/08/2017               Patient Name:  [name] MRN: [id number]  DOB: [birth year] Age / Sex: 42 y.o., male   PCP: [name], DO         Medical Service: Internal Medicine Teaching Service         Attending Physician: Dr. Sid Falcon, MD    First Contact: Dr. Isac Sarna Pager: [phone]  Second Contact: Dr. Danford Bad  Pager: [phone]       After Hours (After 5p/  First Contact Pager: [phone]  weekends / holidays): Second Contact Pager: [phone]   Chief Complaint: Nausea, vomiting, abdominal pain   History of Present Illness: [name] is a 42 yo M with a past medical history of Type 2 DM, HTN, R AKA, CKD III, obesity, GERD, DVT/PE, obesity who presented to the ED with complaints of abdominal pain, n/v.  He reports a two week history of constant abdominal pain with associated nausea and vomiting. He reports his sx have worsened since onset and were worst yesterday. Pain is constant, diffuse throughout abdomen but worse on L side. Pain worsens after attempting food intake. He has had an average of 2-3 episodes of yellowish, brown emesis (no hematemesis reported) per day, and 4 episodes yesterday. He has had decreased PO intake and appetite as a result of his sx. Only able to tolerate small amounts of fluids. He tried some nausea medication without relief. Denies fever, chest pain, change in BMs, new foods or diet changes, sick contacts, shortness of breath. He was been holding his meal time insulin doses with his decreased intake, denies hyperglycemia on home glucose checks. He had been otherwise taking his medications up until this morning. He states his sx are similar to those he experienced around the time of prior admission in fall 2018, but current sx are more severe.   In the ED, T 98.3, HR 82, BP 189/100, 99% on RA. Labs notable for Cr 2.22, Lipase 87. CT Abd/Pelvis obtained showed cholelithiasis without associated inflammatory changes, no evidence of  pancreatitis or other acute findings. He received morphone, Zofran, and IVF and was admitted for further management.   Meds:  Current Meds  Medication Sig  . allopurinol (ZYLOPRIM) 100 MG tablet Take 2 tablets (200 mg total) by mouth daily.  Marland Kitchen amLODipine (NORVASC) 5 MG tablet Take 1 tablet (5 mg total) by mouth daily.  Marland Kitchen apixaban (ELIQUIS) 5 MG TABS tablet Take 1 tablet (5 mg total) by mouth 2 (two) times daily.  . calcitRIOL (ROCALTROL) 0.25 MCG capsule Take 1 capsule (0.25 mcg total) by mouth daily. (Patient taking differently: Take 0.25 mcg by mouth every Monday, Wednesday, and Friday. )  . carvedilol (COREG) 25 MG tablet Take 1 tablet (25 mg total) by mouth 2 (two) times daily with a meal. (Patient taking differently: Take 25 mg by mouth See admin instructions. Take one tablet (25 mg) by mouth twice daily - with breakfast and lunch)  . furosemide (LASIX) 40 MG tablet Take 2 tablets (80 mg total) by mouth 2 (two) times daily.  Marland Kitchen gabapentin (NEURONTIN) 300 MG capsule Take 1 capsule (300 mg total) by mouth 3 (three) times daily.  . hydrALAZINE (APRESOLINE) 50 MG tablet Take 50 mg by mouth 4 (four) times daily.  Marland Kitchen lovastatin (MEVACOR) 20 MG tablet Take 1 tablet (20 mg total) by mouth daily. (Patient taking differently: Take 20 mg by mouth at bedtime. )  Allergies: Allergies as of 06/08/2017 - Review Complete 06/08/2017  Allergen Reaction Noted  . Reglan [metoclopramide] Other (See Comments) 11/01/2016   Past Medical History:  Diagnosis Date  . Acute venous embolism and thrombosis of deep vessels of proximal lower extremity ([place]) 07/19/2011  . Anemia   . Chest pain, neg MI, normal coronaries by cath 02/18/2013  . CKD (chronic kidney disease) stage 3, GFR 30-59 ml/min ([facility]CC) 02/19/2013  . Colon polyps   . Diabetic ulcer of right foot ([place])   . DVT (deep venous thrombosis) ([place]) 09/2002   patient reports additional DVTs in '06 & '11 (unconfirmed)  . GERD (gastroesophageal reflux disease)     . History of blood transfusion    "related to OR" (10/31/2016)  . Hyperlipidemia 02/19/2013  . Hypertension   . Nausea & vomiting    "constant for the last couple weeks" (10/31/2016)  . Nephrotic syndrome   . Obesity    BMI 44, weight 346 pounds 01/30/14  . Pulmonary embolism ([place]) 09/2002   treated with 6 months of warfarin  . Type I diabetes mellitus ([place]) dx'd 2001    Family History:  Family History  Problem Relation Age of Onset  . Heart disease Mother   . Diabetes Mother   . Diabetes Maternal Uncle   . Diabetes Cousin      Social History:  Social History   Tobacco Use  . Smoking status: Never Smoker  . Smokeless tobacco: Never Used  Substance Use Topics  . Alcohol use: Yes    Comment: 10/31/2016 "a few beers q 6 months or so"  . Drug use: No     Review of Systems: A complete ROS was negative except as per HPI.   Physical Exam: Blood pressure (!) 214/106, pulse 82, temperature 98.5 F (36.9 C), temperature source Oral, resp. rate 16, SpO2 98 %. General: Resting in bed comfortably, no acute distress Head: Normocephalic, atraumatic Eyes: Normal conjuctiva, no scleral icterus, PERRL ENT: Dry mucus membranes, no pharyngeal exudate  CV: RRR, no murmur appreciated  Resp: Clear breath sounds bilaterally, normal work of breathing, no distress  Abd: Soft, +BS, obese. Tenderness to palpation diffusely throughout abdomen worst at LLQ. No rebound tenderness or guarding, negative Murphy's sign  Extr: R AKA, LLE edema. Good peripheral pulses and cap refill  Neuro: Alert and oriented x3, moving all extremities spontaneously  Skin: Warm, dry, normal skin turgor    Assessment & Plan by Problem:  Nausea, Vomiting, Elevated Lipase Presenting with 2 week history of abdominal pain with nausea, vomiting. He has a mildly elevated lipase, though without findings of pancreatitis on imaging and pain not well localized to epigastric region as would be expected. Semaglutide, which pt  started in April without appropriate dose titration, can cause elevations in lipase, pancreatitis and nausea. However, his Endocrinologist felt this adverse effect would not be expected to persist for this duration. His sx may also be due to diabetic gastroparesis, symptomatic cholelithiasis. He did have a gastric emptying study performed following his last similar admission (10/2016) which mild delay in gastric emptying. Will continue to treat supportively and monitor sx response.  --Monitor vital signs  --Phenergan 12.5 mg q6hr prn  --IVF at 75 cc/hr --BMP, CBC      Diabetes Mellitus Last A1c 6.2 on 5/28. On outpatient insulin regimen with Tresiba 140 U daily, Novolog 25 U TID, also on tradjenta and semaglutide--discontinued at endocrinology office visit today. No anion gap or ketones to suggest DKA as a cause  for his presentation. Given PO intake, will continue long acting at decreased dose and add SSI.  --Lantus 70 U daily, SSI-moderate. Can adjust as indicated and with diet advancement   H/o Hypertension On outpatient regimen of amlodipine 5 mg, Coreg 25 mg BID, hydralazine 50 mg qid, clonidine 0.2 mg BID. Also on lasix and metolazone for chronic LE edema. Hypertensive on arrival to ED in setting of missed medication doses, likely aspect of rebound with clonidine. Will resume amlodipine, coreg, clonidine for now, hold diuretics.  --Cont home amlodipine, coreg, clonidine   H/o CKD III, Nephrotic Syndrome Cr on admission 2.22, only slightly elevated from prior labs with baseline ~1.9-2. He received 1 L IVF, followed by maintenance. Will decrease rate of fluids given his baseline LE edema and only slim Cr elevation.  --IVF as above    H/o DVT, PE --Cont home Eliquis   Dispo: Admit patient to Observation with expected length of stay less than 2 midnights.  Signed: Tawny Asal, MD 06/08/2017, 9:29 PM  Pager: [phone]

## 2017-06-08 NOTE — ED Notes (Signed)
Pt states sent over by endocrinologist, concerned for pancreatitis.

## 2017-06-08 NOTE — ED Triage Notes (Signed)
Patient complains of nausea, vomiting, and abominal pain x2 weeks. Reports dark colored liquid emesis. Denies "coffee ground" emesis.

## 2017-06-08 NOTE — ED Notes (Signed)
Attempted report x1. 

## 2017-06-08 NOTE — Patient Instructions (Addendum)
Stop Ozempic  Check blood sugars on waking up  daily  Also check blood sugars about 2 hours after a meal and do this after different meals by rotation  Recommended blood sugar levels on waking up is 90-130 and about 2 hours after meal is 130-160  Please bring your blood sugar monitor to each visit, thank you

## 2017-06-09 DIAGNOSIS — R112 Nausea with vomiting, unspecified: Secondary | ICD-10-CM

## 2017-06-09 DIAGNOSIS — Z794 Long term (current) use of insulin: Secondary | ICD-10-CM

## 2017-06-09 DIAGNOSIS — Z86711 Personal history of pulmonary embolism: Secondary | ICD-10-CM | POA: Diagnosis not present

## 2017-06-09 DIAGNOSIS — N183 Chronic kidney disease, stage 3 (moderate): Secondary | ICD-10-CM | POA: Diagnosis not present

## 2017-06-09 DIAGNOSIS — I129 Hypertensive chronic kidney disease with stage 1 through stage 4 chronic kidney disease, or unspecified chronic kidney disease: Secondary | ICD-10-CM | POA: Diagnosis not present

## 2017-06-09 DIAGNOSIS — Z89611 Acquired absence of right leg above knee: Secondary | ICD-10-CM | POA: Diagnosis not present

## 2017-06-09 DIAGNOSIS — Z888 Allergy status to other drugs, medicaments and biological substances status: Secondary | ICD-10-CM

## 2017-06-09 DIAGNOSIS — K219 Gastro-esophageal reflux disease without esophagitis: Secondary | ICD-10-CM | POA: Diagnosis not present

## 2017-06-09 DIAGNOSIS — R1012 Left upper quadrant pain: Secondary | ICD-10-CM

## 2017-06-09 DIAGNOSIS — Z7901 Long term (current) use of anticoagulants: Secondary | ICD-10-CM

## 2017-06-09 DIAGNOSIS — E1122 Type 2 diabetes mellitus with diabetic chronic kidney disease: Secondary | ICD-10-CM

## 2017-06-09 DIAGNOSIS — Z79899 Other long term (current) drug therapy: Secondary | ICD-10-CM

## 2017-06-09 DIAGNOSIS — R1013 Epigastric pain: Secondary | ICD-10-CM | POA: Diagnosis not present

## 2017-06-09 DIAGNOSIS — Z833 Family history of diabetes mellitus: Secondary | ICD-10-CM

## 2017-06-09 DIAGNOSIS — Z86718 Personal history of other venous thrombosis and embolism: Secondary | ICD-10-CM | POA: Diagnosis not present

## 2017-06-09 LAB — BASIC METABOLIC PANEL
ANION GAP: 6 (ref 5–15)
BUN: 33 mg/dL — ABNORMAL HIGH (ref 6–20)
CALCIUM: 8.3 mg/dL — AB (ref 8.9–10.3)
CO2: 25 mmol/L (ref 22–32)
CREATININE: 2.05 mg/dL — AB (ref 0.61–1.24)
Chloride: 109 mmol/L (ref 101–111)
GFR, EST AFRICAN AMERICAN: 45 mL/min — AB (ref >60)
GFR, EST NON AFRICAN AMERICAN: 39 mL/min — AB (ref >60)
Glucose, Bld: 97 mg/dL (ref 65–99)
Potassium: 3.6 mmol/L (ref 3.5–5.1)
SODIUM: 140 mmol/L (ref 135–145)

## 2017-06-09 LAB — GLUCOSE, CAPILLARY
GLUCOSE-CAPILLARY: 173 mg/dL — AB (ref 65–99)
GLUCOSE-CAPILLARY: 95 mg/dL (ref 65–99)
GLUCOSE-CAPILLARY: 179 mg/dL — AB (ref 65–99)
GLUCOSE-CAPILLARY: 192 mg/dL — AB (ref 65–99)
Glucose-Capillary: 98 mg/dL (ref 65–99)
Glucose-Capillary: 214 mg/dL — ABNORMAL HIGH (ref 65–99)

## 2017-06-09 LAB — CBC
HCT: 35.2 % — ABNORMAL LOW (ref 39.0–52.0)
Hemoglobin: 11.3 g/dL — ABNORMAL LOW (ref 13.0–17.0)
MCH: 28 pg (ref 26.0–34.0)
MCHC: 32.1 g/dL (ref 30.0–36.0)
MCV: 87.3 fL (ref 78.0–100.0)
PLATELETS: 231 10*3/uL (ref 150–400)
RBC: 4.03 MIL/uL — AB (ref 4.22–5.81)
RDW: 13.2 % (ref 11.5–15.5)
WBC: 7.5 10*3/uL (ref 4.0–10.5)

## 2017-06-09 NOTE — Progress Notes (Signed)
   Subjective: Patient seen and evaluated at bedside this morning. Feels better since admission, noting his abdominal pain and nausea have improved. Does still have some pain, and asked several informed questions including possible need for surgery and how to prevent future episodes after researching pancreatitis online and discussing with RN friends.   Objective:  Vital signs in last 24 hours: Vitals:   06/08/17 2206 06/09/17 0300 06/09/17 0418 06/09/17 1018  BP: (!) 190/99 (!) 158/89 138/83 138/83  Pulse: 81 78 78   Resp: 18  18   Temp: 98.4 F (36.9 C)  98.3 F (36.8 C)   TempSrc: Oral  Oral   SpO2: 100%  97%    General: Morbidly obese african [place] male resting comfortably in bed. In no acute distress. Pleasant and conversant  HENT: Normocephalic, atraumatic. Oropharynx clear, mucous membranes moist.  Cardiovascular: RRR no murmur Pulmonary: CTA BL. Unlabored breathing.  Abdomen: Obese abdomen with TTP in epigastric region and also LLQ. No rebound, guarding or rigidity.  Extremities: R AKA, LLE edema which is chronic per patient. Skin: Warm, dry. No cyanosis.  Psych: Mood normal and affect was mood congruent. Responds to questions appropriately.   Assessment/Plan:  Active Problems:   DM (diabetes mellitus) type I uncontrolled, periph vascular disorder (HCC)   Hypertension   Nausea and vomiting  Nausea, Vomiting, Abdominal Pain Suspect patients nausea, vomiting and abdominal pain related to recent addition of long-acting GLP1-antagonist in this patient with known mild baseline gastroparesis. Symptoms started just after starting this medication and although he's been on a GLP-1 antagonist before without apparent issue, Ozempics long-acting nature might be responsible. Also included on the differential is symptomatic cholelithiasis (although less likely given no LFT abnormalities or RUQ TTP or positive Murphys sign), a mild pancreatitis or gastritis. He is tolerating a liquid  diet and is open to advancing to more solid foods, and specifically wishes to try salad.  -Advance diet slowly, will work on a soft diet and trial of salad per patient request -Continue to hold Oxford IVF as pt able to tolerate PO liquids without issue, but will hold home diuretics -Phenergan PRN -Suspect DC tomorrow if patient able to tolerate PO  DM2 CBGs in 90's so far today on modified version of home regimen. Very well-controlled as an outpatient and is followed closely with his endocrinologist. -Continue Lantus 70 units daily -SSI-moderate -Continue to hold GLP-1 inhibitor as this can delay gastric emptying   HTN Initially quite hypertensive on arrival to ED however pt unable to tolerate several PO medication doses. BP much improved after resuming home Amlodipine and Coreg, as well as reduced dose of Clonidine. Pressures have now normalized.  -Continue home Amlodipine 5mg  and Coreg 25mg  BID -Continue Clonidine 0.2mg  BID (home dose 0.3mg  BID) -Currently holding Hydralazine 50mg  TID, as well as home diuretics; caution with IVF  CKD3, Nephrotic Syndrome Cr 2.2 on admission, which is just above is baseline of 1.9-2. Received a total of 1.5L IVF overnight, Cr now 2.05. Hold nephrotoxic meds. Will recheck in AM.  History of DVT On Eliquis which has been continued during admission.   Dispo: Anticipated discharge tomorrow pending ability to tolerate PO intake.  Jaymere Alen, DO 06/09/2017, 11:12 AM Pager: [phone]

## 2017-06-09 NOTE — Discharge Instructions (Signed)
Information on my medicine - ELIQUIS (apixaban)  This medication education was reviewed with me or my healthcare representative as part of my discharge preparation.   Why was Eliquis prescribed for you? Eliquis was prescribed to treat blood clots that may have been found in the veins of your legs (deep vein thrombosis) or in your lungs (pulmonary embolism) and to reduce the risk of them occurring again.  What do You need to know about Eliquis ? The dose is 5 mg tablet taken TWICE daily.  Eliquis may be taken with or without food.   Try to take the dose about the same time in the morning and in the evening. If you have difficulty swallowing the tablet whole please discuss with your pharmacist how to take the medication safely.  Take Eliquis exactly as prescribed and DO NOT stop taking Eliquis without talking to the doctor who prescribed the medication.  Stopping may increase your risk of developing a new blood clot.  Refill your prescription before you run out.  After discharge, you should have regular check-up appointments with your healthcare provider that is prescribing your Eliquis.    What do you do if you miss a dose? If a dose of ELIQUIS is not taken at the scheduled time, take it as soon as possible on the same day and twice-daily administration should be resumed. The dose should not be doubled to make up for a missed dose.  Important Safety Information A possible side effect of Eliquis is bleeding. You should call your healthcare provider right away if you experience any of the following: Bleeding from an injury or your nose that does not stop. Unusual colored urine (red or dark brown) or unusual colored stools (red or black). Unusual bruising for unknown reasons. A serious fall or if you hit your head (even if there is no bleeding).  Some medicines may interact with Eliquis and might increase your risk of bleeding or clotting while on Eliquis. To help avoid this,  consult your healthcare provider or pharmacist prior to using any new prescription or non-prescription medications, including herbals, vitamins, non-steroidal anti-inflammatory drugs (NSAIDs) and supplements.  This website has more information on Eliquis (apixaban): http://www.eliquis.com/eliquis/home  

## 2017-06-10 DIAGNOSIS — R112 Nausea with vomiting, unspecified: Secondary | ICD-10-CM | POA: Diagnosis not present

## 2017-06-10 LAB — GLUCOSE, CAPILLARY
GLUCOSE-CAPILLARY: 151 mg/dL — AB (ref 65–99)
GLUCOSE-CAPILLARY: 164 mg/dL — AB (ref 65–99)
GLUCOSE-CAPILLARY: 53 mg/dL — AB (ref 65–99)
GLUCOSE-CAPILLARY: 56 mg/dL — AB (ref 65–99)
Glucose-Capillary: 62 mg/dL — ABNORMAL LOW (ref 65–99)
Glucose-Capillary: 52 mg/dL — ABNORMAL LOW (ref 65–99)
Glucose-Capillary: 157 mg/dL — ABNORMAL HIGH (ref 65–99)
Glucose-Capillary: 165 mg/dL — ABNORMAL HIGH (ref 65–99)

## 2017-06-10 LAB — BASIC METABOLIC PANEL
Anion gap: 4 — ABNORMAL LOW (ref 5–15)
BUN: 33 mg/dL — ABNORMAL HIGH (ref 6–20)
CHLORIDE: 106 mmol/L (ref 101–111)
CO2: 27 mmol/L (ref 22–32)
Calcium: 8.2 mg/dL — ABNORMAL LOW (ref 8.9–10.3)
Creatinine, Ser: 2.16 mg/dL — ABNORMAL HIGH (ref 0.61–1.24)
GFR, EST AFRICAN AMERICAN: 42 mL/min — AB (ref >60)
GFR, EST NON AFRICAN AMERICAN: 36 mL/min — AB (ref >60)
Glucose, Bld: 48 mg/dL — ABNORMAL LOW (ref 65–99)
POTASSIUM: 3.5 mmol/L (ref 3.5–5.1)
SODIUM: 137 mmol/L (ref 135–145)

## 2017-06-10 MED ORDER — INSULIN ASPART 100 UNIT/ML ~~LOC~~ SOLN
0.0000 [IU] | Freq: Three times a day (TID) | SUBCUTANEOUS | Status: DC
  Administered 2017-06-10 (×2): 3 [IU] via SUBCUTANEOUS

## 2017-06-10 MED ORDER — DEXTROSE 50 % IV SOLN
INTRAVENOUS | Status: AC
  Administered 2017-06-10: 20 mL
  Filled 2017-06-10: qty 50

## 2017-06-10 MED ORDER — DEXTROSE 50 % IV SOLN
INTRAVENOUS | Status: AC
  Administered 2017-06-10: 50 mL
  Filled 2017-06-10: qty 50

## 2017-06-10 MED ORDER — CLONIDINE HCL 0.2 MG PO TABS
0.3000 mg | ORAL_TABLET | Freq: Two times a day (BID) | ORAL | Status: DC
  Administered 2017-06-10: 0.3 mg via ORAL
  Filled 2017-06-10: qty 1

## 2017-06-10 MED ORDER — PROMETHAZINE HCL 12.5 MG PO TABS: 12.5000 mg | ORAL_TABLET | Freq: Three times a day (TID) | ORAL | 0 refills | Status: DC | PRN

## 2017-06-10 NOTE — Discharge Summary (Signed)
Name: [name] MRN: [id number] DOB: October 26, 1975 42 y.o. PCP: Victor Party, DO  Date of Admission: 06/08/2017 11:11 AM Date of Discharge: 06/10/2017 Attending Physician: Victor Falcon, MD  Discharge Diagnosis: 1. Nausea and vomiting  2. Insulin-dependent E2AS complicated by gastroparesis  3. CKD Stage III 4. Nephrotic syndrome  5. HTN  6. History of DVT  Principal Problem:   Nausea and vomiting Active Problems:   DM (diabetes mellitus) type I uncontrolled, periph vascular disorder (HCC)   History of DVT (deep vein thrombosis)   Hypertension   Nephrotic syndrome   CKD (chronic kidney disease) stage 3, GFR 30-59 ml/min (HCC)   Discharge Medications: Allergies as of 06/10/2017      Reactions   Reglan [metoclopramide] Other (See Comments)   Dysphoric reaction      Medication List    STOP taking these medications   Semaglutide 0.25 or 0.5 MG/DOSE Sopn Commonly known as:  OZEMPIC     TAKE these medications   ACCU-CHEK AVIVA device Use to check blood sugar 5 times daily. Diag code E10.51. Multiple insulin dependent   accu-chek softclix lancets Use to check blood sugars 5 times a day. Dx code:E11.9. Insulin dependent.   allopurinol 100 MG tablet Commonly known as:  ZYLOPRIM Take 2 tablets (200 mg total) by mouth daily.   Alpha-Lipoic Acid 200 MG Caps Take 1 capsule (200 mg total) by mouth daily.   amLODipine 5 MG tablet Commonly known as:  NORVASC Take 1 tablet (5 mg total) by mouth daily.   apixaban 5 MG Tabs tablet Commonly known as:  ELIQUIS Take 1 tablet (5 mg total) by mouth 2 (two) times daily.   B-12 2500 MCG Subl Place 2,500 mcg under the tongue daily.   calcitRIOL 0.25 MCG capsule Commonly known as:  ROCALTROL Take 1 capsule (0.25 mcg total) by mouth daily. What changed:  when to take this   carvedilol 25 MG tablet Commonly known as:  COREG Take 1 tablet (25 mg total) by mouth 2 (two) times daily with a meal. What changed:     when to take this  additional instructions   cloNIDine 0.1 MG tablet Commonly known as:  CATAPRES Take 0.1 mg by mouth See admin instructions. Take one tablet (0.1 mg) by mouth with a 0.2 mg tablet for a total dose of 0.3 mg - twice daily What changed:  Another medication with the same name was changed. Make sure you understand how and when to take each.   cloNIDine 0.2 MG tablet Commonly known as:  CATAPRES Take 1 tablet (0.2 mg total) by mouth 2 (two) times daily. What changed:    when to take this  additional instructions   FISH OIL PO Take 1 capsule by mouth at bedtime.   furosemide 40 MG tablet Commonly known as:  LASIX Take 2 tablets (80 mg total) by mouth 2 (two) times daily.   gabapentin 300 MG capsule Commonly known as:  NEURONTIN Take 1 capsule (300 mg total) by mouth 3 (three) times daily.   glucose blood test strip Commonly known as:  ACCU-CHEK AVIVA Use to check blood sugars 5 times a day. Dx code:E11.9. Insulin dependent.   hydrALAZINE 50 MG tablet Commonly known as:  APRESOLINE Take 50 mg by mouth 4 (four) times daily.   ibuprofen 200 MG tablet Commonly known as:  ADVIL,MOTRIN Take 200 mg by mouth every 6 (six) hours as needed for headache (pain).   insulin aspart 100 UNIT/ML FlexPen Commonly known  as:  NOVOLOG Inject 25 Units into the skin 3 (three) times daily. What changed:  when to take this   Insulin Degludec 200 UNIT/ML Sopn Inject 140 Units into the skin daily. What changed:    when to take this  additional instructions   linagliptin 5 MG Tabs tablet Commonly known as:  TRADJENTA Take 1 tablet (5 mg total) by mouth daily.   loratadine 10 MG tablet Commonly known as:  CLARITIN Take 10 mg by mouth daily as needed (seasonal allergies).   lovastatin 20 MG tablet Commonly known as:  MEVACOR Take 1 tablet (20 mg total) by mouth daily. What changed:  when to take this   metolazone 2.5 MG tablet Commonly known as:  ZAROXOLYN Take  2.5 mg by mouth every other day.   Pen Needles 3/16" 31G X 5 MM Misc Use five times daily   polyethylene glycol packet Commonly known as:  MIRALAX / GLYCOLAX Take 17 g by mouth daily as needed for mild constipation.   promethazine 12.5 MG tablet Commonly known as:  PHENERGAN Take 1 tablet (12.5 mg total) by mouth every 8 (eight) hours as needed for nausea or vomiting.   senna-docusate 8.6-50 MG tablet Commonly known as:  Senokot-S Take 1 tablet by mouth daily. What changed:    when to take this  reasons to take this   Zinc Gluconate 100 MG Tabs Take 2.2 tablets (220 mg total) by mouth daily.   zinc sulfate 220 (50 Zn) MG capsule Take 220 mg by mouth daily.       Disposition and follow-up:   [name] was discharged from [facility] in Stable condition.  At the hospital follow up visit please address:  1.  Patient was admitted with 2 weeks of abdominal pain associated with N/V. Thought to be secondary to gastroparesis vs mild pancreatitis due to recent initiation of Semaglutide (Ozempic). Please ensure patient has stopped taking this medication and follow up with his endocrinologist. Please continue to educate patient on importance of eating small meals.   2.  Labs / imaging needed at time of follow-up: BMP to check renal function   3.  Pending labs/ test needing follow-up: None   Follow-up Appointments: Follow-up Information    [facility] Follow up.   Why:  [name],  The internal medicine clinic staff will call you on Monday 6/3 to schedule a follow up appointment with you within 7 days of your day of discharge.  Contact information: [address] [facility] Course by problem list: Principal Problem:   Nausea and vomiting Active Problems:   DM (diabetes mellitus) type I uncontrolled, periph vascular disorder (HCC)   History of DVT (deep vein  thrombosis)   Hypertension   Nephrotic syndrome   CKD (chronic kidney disease) stage 3, GFR 30-59 ml/min (HCC)   # Abdominal pain associated with nausea and vomiting  # Insulin-dependent G2BW complicated by gastroparesis Patient has a known history of gastroparesis and presented with a 2-week history of abdominal pain associated with NBNB emesis. Etiology is uncertain, but it is suspected to be multifactorial due to gastropareis and recent initiation of Ozempic which is associated with nausea and pancreatitis (midly elevated lipase but CT abd/pelvis without evidence of pancreatitis). He was managed conservatively with IV fluids, anti-emetics and bowel rest with improvement in symptoms. On day of discharge, he was instructed to stop Ozempic,  resume his home insulin regimen, and follow up with his endocrinologist.   # CKD Stage III # Nephrotic syndrome  Patient presented with AKI on CKD thought to be pre-renal secondary to dehydration in the setting of poor PO intake for several days prior to admission. He received a total of 1.5L IVF with improvement on renal function. Creatinine at baseline on day of discharge. Patient was instructed to resume his home diuretics (Lasix and metolazone) on day after discharge at the same dose.   # HTN: Patient hypertensive on admission due to medication non-adherence in the setting of nausea and vomiting. Blood pressure improved after resumption of home antihypertensive medications.  He was discharged on home clonidine, amlodipine, Coreg, and hydralazine.   # History of DVT: Home Eliquis 5 mg BID was continued during this admission.    Discharge Vitals:   BP (!) 161/87 (BP Location: Right Arm)   Pulse 73   Temp 97.8 F (36.6 C) (Oral)   Resp 18   SpO2 100%   Pertinent Labs, Studies, and Procedures:  CBC Latest Ref Rng & Units 06/09/2017 06/08/2017 02/07/2017  WBC 4.0 - 10.5 K/uL 7.5 8.1 10.9(H)  Hemoglobin 13.0 - 17.0 g/dL 11.3(L) 11.8(L) 11.6(L)  Hematocrit  39.0 - 52.0 % 35.2(L) 36.8(L) 36.0(L)  Platelets 150 - 400 K/uL 231 250 259   BMP Latest Ref Rng & Units 06/10/2017 06/09/2017 06/08/2017  Glucose 65 - 99 mg/dL 48(L) 97 387(H)  BUN 6 - 20 mg/dL 33(H) 33(H) 38(H)  Creatinine 0.61 - 1.24 mg/dL 2.16(H) 2.05(H) 2.22(H)  BUN/Creat Ratio 9 - 20 - - -  Sodium 135 - 145 mmol/L 137 140 137  Potassium 3.5 - 5.1 mmol/L 3.5 3.6 4.4  Chloride 101 - 111 mmol/L 106 109 103  CO2 22 - 32 mmol/L 27 25 26   Calcium 8.9 - 10.3 mg/dL 8.2(L) 8.3(L) 8.6(L)   Lipase 87  CT abdomen/pelvis 5/31: IMPRESSION: No CT findings to confirm the diagnosis of acute pancreatitis. No peripancreatic fluid collections/pseudocysts. Cholelithiasis, without associated inflammatory changes.   Discharge Instructions: Discharge Instructions    Diet Carb Modified   Complete by:  As directed    Discharge instructions   Complete by:  As directed    Mr. Blayton, Huttner were admitted to the hospital for uncontrolled nausea and vomiting. We believe this was due to your known gastroparesis that was exacerbated by the addition of your new diabetes medication called Ozempic.   We ask that you continue taking your insulin both long and short acting as usual. You can also continue taking Tradjenta.   PLEASE STOP TAKING OZEMPIC as this medication can cause further nausea and vomiting. Please remember to eat small meals slowly to prevent further nausea and vomiting.   If you start to experience dizziness, blurry vision, headache and sweating please check your blood sugar as your could be having low blood sugar. IF this is case, drink juice or any sweet beverages around you.  Please follow up with your endocrinologist as you will likely need further adjustment in your diabetes medications. This is very important!  For your blood pressure, start taking your Lasix and metolazone tomorrow (06/11/2017) at your regular doses.  The internal medicine clinic will call you to set a follow up  appointment next week. Please call us if you have any questions before then.   Increase activity slowly   Complete by:  As directed       Signed: Welford Roche, MD 06/13/2017, 9:24 AM  Pager: [phone]

## 2017-06-10 NOTE — Progress Notes (Signed)
Message sent to Dr Danford Bad regarding a prescription for pain and nausea to take while at home

## 2017-06-10 NOTE — Progress Notes (Signed)
Dr Daryll Drown paged-in regards to a repeated low glucose reading of 52.  The night shift nurse reported that the patient had a low blood glucose reading this morning and 1 amp of Dextrose 50%. After the repeat reading of 52 an additional Dextrose 50% given IVP.  The patient is alert and oriented, skin warm and dry.  He is eating breakfast. Dr Daryll Drown did return the call and will communicate with colleagues.  No new orders received

## 2017-06-10 NOTE — Progress Notes (Signed)
  Date: 06/10/2017  Patient name: [name]  Medical record number: [id number]  Date of birth: 03-03-1975   This patient's plan of care was discussed with the house staff. Please see Dr. Frederico Hamman' note for complete details. I concur with her findings.   Sid Falcon, MD 06/10/2017, 1:28 PM

## 2017-06-10 NOTE — Progress Notes (Signed)
Subjective:  No acute events overnight. This morning CBG in the 40s and repeat in the 50s. Received a total of 2 amps of D50. When seen, patient was resting comfortably in bed. He had just eaten breakfast. He was alert and oriented and did not voice any complaints.   Objective:  Vital signs in last 24 hours: Vitals:   06/09/17 1018 06/09/17 1803 06/09/17 1957 06/10/17 0351  BP: 138/83 (!) 172/82 (!) 188/88 (!) 161/87  Pulse:  78 74 73  Resp:      Temp:  (!) 97.4 F (36.3 C) (!) 97.5 F (36.4 C) 97.8 F (36.6 C)  TempSrc:  Oral Oral Oral  SpO2:  92% 91% 100%   General: morbidly obese male resting comfortably in bed, arouses easily to voice, no diaphoresis noted  Cardiac: regular rate and rhythm, nl S1/S2, no murmurs, rubs or gallops  Pulm: CTAB, no increased work of breathing while on room air  Abd: abdomen is obese, soft, mildly tender diffusely, bowel sounds present Neuro: A&Ox3, no gross deficits noted  Ext: warm and well perfused, no peripheral edema, s/p R AKA   Assessment/Plan:  Principal Problem:   Nausea and vomiting Active Problems:   DM (diabetes mellitus) type I uncontrolled, periph vascular disorder (HCC)   History of DVT (deep vein thrombosis)   Hypertension   Nephrotic syndrome   CKD (chronic kidney disease) stage 3, GFR 30-59 ml/min (HCC)  # Abdominal pain associated with nausea and vomiting: Suspect to be secondary to recent initiation of long-acting GLP1 antagonist Ozempic in patient with known history of gastroparesis. Low suspicion for pancreatitis (lipase 87 and no signs of pancreatitis of CT abd/pelvis). Gastritis and PUD also possible given onset of symptoms after meals. He was able to tolerate a CM diet this morning without N/V. Will plan for tentative discharge this morning if able to tolerate lunch.  - Hold Ozempic for now. Recommend patient follow up with endocrinologist for change in therapy  - Can consider adding a PPI if high suspicion for  gastritis and/or PUD. He does have a history of GERD   # Insulin-dependent T2DM # Hypoglycemia  Patient follows up with endocrinology as an outpatient. On Tresiba 140 and Novolog 25U TID. Ozempic (long-acting GLP-1 antagonist) recently added to regimen. He has been on Lantus 70U QHS and moderate SSI q4h while inpatient with adequate glycemic control. However, this morning he was found to be persistently hypoglycemia after receiving 2 amps of D50. I have switched his SSI to TID with meals instead of every 4 hours as his persistent hypoglycemia could be secondary to frequency of short-acting insulin dosing in the setting of poor PO intake.  - Continue monitoring CBG  - Continue Lantus 70 units QHS  - SSI-M q4h --> TID with meals   # HTN  Hypertensive on admission secondary to non-compliance with home antihypertensive medications in the setting of nausea and vomiting.  BP now improved after resuming 3 home meds (clonidine resumed at lower dose). He did became hypertensive overnight with sBP 170-180s. Will therefore increase clonidine to home dose as below.  - Clonidine 0.2mg  BID --> 0.3 mg BID  - Continue home amlodipine 5 mg QD + Coreg 25 mg BID  - Holding Hydralazine at this time as well as home diuretic (metolazone and Lasix)  # CKD 3 # Nephrotic syndrome  Cr 2.2 on admission with baseline creatinine of ~2. This improved after 1.5L IVF. Today, Cr is 2.16.   - Avoid nephrotoxic medications   #  History of DVT - Continue home Eliquis   Dispo: Anticipated discharge today if continues to tolerate PO intake and BG improves.   Welford Roche, MD 06/10/2017, 8:26 AM Pager: [phone]

## 2017-06-10 NOTE — Progress Notes (Signed)
Written and verbal discharge instructions provided to the patient.  Patient will have a prescription for nausea while at home.  Patient is aware of the need for follow up.  The patient and his family member verbalizes understanding discharge instructions

## 2017-06-14 ENCOUNTER — Ambulatory Visit (INDEPENDENT_AMBULATORY_CARE_PROVIDER_SITE_OTHER): Payer: Medicare Other | Admitting: Internal Medicine

## 2017-06-14 ENCOUNTER — Encounter: Payer: Self-pay | Admitting: Internal Medicine

## 2017-06-14 VITALS — BP 154/80 | HR 89

## 2017-06-14 DIAGNOSIS — N183 Chronic kidney disease, stage 3 unspecified: Secondary | ICD-10-CM

## 2017-06-14 DIAGNOSIS — K859 Acute pancreatitis without necrosis or infection, unspecified: Secondary | ICD-10-CM

## 2017-06-14 DIAGNOSIS — E1043 Type 1 diabetes mellitus with diabetic autonomic (poly)neuropathy: Secondary | ICD-10-CM

## 2017-06-14 DIAGNOSIS — K3184 Gastroparesis: Secondary | ICD-10-CM | POA: Diagnosis not present

## 2017-06-14 DIAGNOSIS — Z89611 Acquired absence of right leg above knee: Secondary | ICD-10-CM | POA: Diagnosis not present

## 2017-06-14 DIAGNOSIS — M25569 Pain in unspecified knee: Secondary | ICD-10-CM | POA: Diagnosis not present

## 2017-06-14 DIAGNOSIS — E1022 Type 1 diabetes mellitus with diabetic chronic kidney disease: Secondary | ICD-10-CM

## 2017-06-14 DIAGNOSIS — R112 Nausea with vomiting, unspecified: Secondary | ICD-10-CM

## 2017-06-14 NOTE — Patient Instructions (Addendum)
[name],  I am sorry you had the problems and had to go to the hospital. I am glad you are feeling better.  Patella and abdominal pain should continue to improve.  It is okay for you to return I would just caution you about overexerting yourself.  The medication you were taking was in a class called GLP 1  You are also on another medication that works in a similar way called DPP 4 inhibitors.  These usually should not be combined and may have contributed to your symptoms.  Schedule appointment with your primary care physician in the next several months.

## 2017-06-14 NOTE — Progress Notes (Signed)
   CC: Hospital follow-up for nausea vomiting  HPI:  Mr.Avant Olheiser is a 42 y.o. male with a past medical history listed below here today for follow up of her recent hospitalization from 06/08/17 to 06/10/2017 for dehydration secondary to nausea vomiting.  Mr. Mccauley has a history of gastroparesis secondary to type I diabetes who presented on 06/08/2017 with a 2-week history of abdominal pain with nonbloody nonbilious emesis.  Etiology was unclear but is suspected to be related to her gastroparesis as well as an recent initiation of semaglutide which does have some association with nausea and pancreatitis (lipase was mildly elevated at 87 she had no CT evidence of pancreatitis).  She was treated with IV fluids and antiemetics with improvement in symptoms.  Shortly, he did have an AKI superimposed on CKD during hospitalization.  His baseline appears to be around 1.9-2 and was 2.16 on discharge.  Today, he reports that he is feeling much improved.  He is able to eat and drink without issue.  His nausea and vomiting have subsided.  He does have some persistent epigastric and left upper quadrant soreness.  He reports that his appetite has not returned to baseline but is improving.  For details of today's visit and the status of her chronic medical issues please refer to the assessment and plan.   Past Medical History:  Diagnosis Date  . Acute venous embolism and thrombosis of deep vessels of proximal lower extremity ([place]) 07/19/2011  . Anemia   . Chest pain, neg MI, normal coronaries by cath 02/18/2013  . CKD (chronic kidney disease) stage 3, GFR 30-59 ml/min (HCC) 02/19/2013  . Colon polyps   . Diabetic ulcer of right foot ([place])   . DVT (deep venous thrombosis) ([place]) 09/2002   patient reports additional DVTs in '06 & '11 (unconfirmed)  . GERD (gastroesophageal reflux disease)   . History of blood transfusion    "related to OR" (10/31/2016)  . Hyperlipidemia 02/19/2013  . Hypertension   . Nausea  & vomiting    "constant for the last couple weeks" (10/31/2016)  . Nephrotic syndrome   . Obesity    BMI 44, weight 346 pounds 01/30/14  . Pulmonary embolism ([place]) 09/2002   treated with 6 months of warfarin  . Type I diabetes mellitus ([place]) dx'd 2001   Review of Systems:   No chest pain or shortness of breath  Physical Exam:  Vitals:   06/14/17 1512  BP: (!) 154/80  Pulse: 89  SpO2: 100%   GENERAL- alert, co-operative, appears as stated age, not in any distress. HEENT- Atraumatic, normocephalic,oral mucosa appears moist CARDIAC- RRR, no murmurs, rubs or gallops. RESP- Clear to auscultation bilaterally, no wheezes or crackles. ABDOMEN- Soft, mild epigastric and left upper quadrant tenderness to palpation, bowel sounds present.  No rebound tenderness or guarding. EXTREMITIES-right AKA SKIN- Warm, dry, No rash or lesion. PSYCH- Normal mood and affect, appropriate thought content and speech.   Assessment & Plan:   See Encounters Tab for problem based charting.  Patient discussed with Dr. Evette Doffing

## 2017-06-15 NOTE — Progress Notes (Signed)
Internal Medicine Clinic Attending  Case discussed with Dr. Boswell at the time of the visit.  We reviewed the resident's history and exam and pertinent patient test results.  I agree with the assessment, diagnosis, and plan of care documented in the resident's note.  

## 2017-06-15 NOTE — Assessment & Plan Note (Addendum)
Patient here for hospital follow-up for nausea and vomiting.  See HPI for full details.  A/P She was recently started on a long acting GLP-1 agonist.  Dr. Evette Doffing noted on his medication list that he is also on a DPP 4 (linagliptin).  It is likely that the interaction of these medications precipitated his GI symptoms.  His GLP-1 has been discontinued.  Patient is improving, will tolerate p.o. Intake.  No further work-up necessary at this time.  He has follow-up with his endocrinologist at the end of the month for further management of his diabetic medications.

## 2017-06-15 NOTE — Assessment & Plan Note (Signed)
He had a mild AKI on presentation to the hospital on 06/08/2017.  Creatinine was still mildly elevated at 2.16 at discharge.  However this is not far off from his baseline.  Will not recheck labs today as patient is improving.

## 2017-06-25 DIAGNOSIS — Z86718 Personal history of other venous thrombosis and embolism: Secondary | ICD-10-CM | POA: Diagnosis not present

## 2017-06-25 DIAGNOSIS — N2581 Secondary hyperparathyroidism of renal origin: Secondary | ICD-10-CM | POA: Diagnosis not present

## 2017-06-25 DIAGNOSIS — D631 Anemia in chronic kidney disease: Secondary | ICD-10-CM | POA: Diagnosis not present

## 2017-06-25 DIAGNOSIS — Z794 Long term (current) use of insulin: Secondary | ICD-10-CM | POA: Diagnosis not present

## 2017-06-25 DIAGNOSIS — I129 Hypertensive chronic kidney disease with stage 1 through stage 4 chronic kidney disease, or unspecified chronic kidney disease: Secondary | ICD-10-CM | POA: Diagnosis not present

## 2017-06-25 DIAGNOSIS — Z6841 Body Mass Index (BMI) 40.0 and over, adult: Secondary | ICD-10-CM | POA: Diagnosis not present

## 2017-06-25 DIAGNOSIS — E1122 Type 2 diabetes mellitus with diabetic chronic kidney disease: Secondary | ICD-10-CM | POA: Diagnosis not present

## 2017-06-25 DIAGNOSIS — N183 Chronic kidney disease, stage 3 (moderate): Secondary | ICD-10-CM | POA: Diagnosis not present

## 2017-06-29 DIAGNOSIS — E1122 Type 2 diabetes mellitus with diabetic chronic kidney disease: Secondary | ICD-10-CM | POA: Diagnosis not present

## 2017-07-08 NOTE — Progress Notes (Deleted)
Patient ID: [name], male   DOB: 24-Feb-1975, 42 y.o.   MRN: [id number]          Reason for Appointment: Consultation for Type 2 Diabetes  Referring physician: Jannette Fogo   History of Present Illness:          Date of diagnosis of type 2 diabetes mellitus:  2001      His weight is about 400 pounds at the time of diagnosis He does not know what his initial blood sugars and circumstances were but he apparently was started on insulin initially He was not able to tolerate metformin because of diarrhea Subsequently has been on insulin only, level of control not available but his A1c was significantly high at 12.8 in 2015  Background history:    Recent history:   INSULIN regimen is:  140 units Tresiba, NovoLog 0-25 U tid     Non-insulin hypoglycemic drugs the patient is taking are: None  His A1c was 7.9 done in 2/19, now it is 6.2   Current management, blood sugar patterns and problems identified:   He has been on Ozempic since his initial consultation in 04/2017 and has probably taken about 4 to 5 injection  Although he was told to try 0.25 for the first 4 weeks he appears to be taking 0.5 mg weekly on Tuesdays  He thinks he started having nausea and this persisted and over the last 2 weeks has had vomiting also; previously had no difficulty with taking Victoza  However even though he did not take the injection on Tuesday this week he still continues to have persistent vomiting  He has been told not to take NovoLog at least at breakfast and he says that since his appetite has been significantly decreased he has not taken NovoLog at all  He did not bring his monitor for download as directed and not clear how often he is checking his sugar  He still likes to exercise every morning for a couple of hours, recently not getting any hypoglycemia with skipping his NovoLog  Does not appear to have lost any weight  He has taken his Tyler Aas about the same dose of 140 and he thinks his  morning sugars are about 110-120, not clear if they are higher after meals        Side effects from medications have been: Diarrhea with metformin  Compliance with the medical regimen: Good Hypoglycemia:   As above  Glucose monitoring:  done?  3 times a day         Glucometer:   ACCU-CHEK  Readings not available today  Self-care: The diet that the patient has been following is: low carb meals, since March  2019   Typical meal intake: Breakfast is egg whites, some oatmeal and banana.  Usually small portions of meat or other protein at lunch and dinner and only a small amount of sweet potato at lunch or carbohydrate              Dietician visit, most recent: Unknown               Exercise:  Weights, resistance and some cardio at the gym at least 5 days a week, does 2 sessions of 1 hour each  Weight history:  Wt Readings from Last 3 Encounters:  06/08/17 (!) 392 lb (177.8 kg)  04/24/17 (!) 392 lb 9.6 oz (178.1 kg)  11/16/16 (!) 369 lb 1.6 oz (167.4 kg)    Glycemic control:   Lab Results  Component Value Date   HGBA1C 6.2 06/05/2017   HGBA1C 7.9 02/15/2017   HGBA1C 6.3 10/31/2016   Lab Results  Component Value Date   MICROALBUR 160.61 (H) 08/14/2013   LDLCALC 91 06/05/2017   CREATININE 2.16 (H) 06/10/2017   Lab Results  Component Value Date   MICRALBCREAT 818.2 (H) 08/14/2013    No results found for: FRUCTOSAMINE    Allergies as of 07/09/2017      Reactions   Reglan [metoclopramide] Other (See Comments)   Dysphoric reaction      Medication List        Accurate as of 07/08/17  8:46 PM. Always use your most recent med list.          ACCU-CHEK AVIVA device Use to check blood sugar 5 times daily. Diag code E10.51. Multiple insulin dependent   accu-chek softclix lancets Use to check blood sugars 5 times a day. Dx code:E11.9. Insulin dependent.   allopurinol 100 MG tablet Commonly known as:  ZYLOPRIM Take 2 tablets (200 mg total) by mouth daily.     Alpha-Lipoic Acid 200 MG Caps Take 1 capsule (200 mg total) by mouth daily.   amLODipine 5 MG tablet Commonly known as:  NORVASC Take 1 tablet (5 mg total) by mouth daily.   apixaban 5 MG Tabs tablet Commonly known as:  ELIQUIS Take 1 tablet (5 mg total) by mouth 2 (two) times daily.   B-12 2500 MCG Subl Place 2,500 mcg under the tongue daily.   calcitRIOL 0.25 MCG capsule Commonly known as:  ROCALTROL Take 1 capsule (0.25 mcg total) by mouth daily.   carvedilol 25 MG tablet Commonly known as:  COREG Take 1 tablet (25 mg total) by mouth 2 (two) times daily with a meal.   cloNIDine 0.1 MG tablet Commonly known as:  CATAPRES Take 0.1 mg by mouth See admin instructions. Take one tablet (0.1 mg) by mouth with a 0.2 mg tablet for a total dose of 0.3 mg - twice daily   cloNIDine 0.2 MG tablet Commonly known as:  CATAPRES Take 1 tablet (0.2 mg total) by mouth 2 (two) times daily.   FISH OIL PO Take 1 capsule by mouth at bedtime.   furosemide 40 MG tablet Commonly known as:  LASIX Take 2 tablets (80 mg total) by mouth 2 (two) times daily.   gabapentin 300 MG capsule Commonly known as:  NEURONTIN Take 1 capsule (300 mg total) by mouth 3 (three) times daily.   glucose blood test strip Commonly known as:  ACCU-CHEK AVIVA Use to check blood sugars 5 times a day. Dx code:E11.9. Insulin dependent.   hydrALAZINE 50 MG tablet Commonly known as:  APRESOLINE Take 50 mg by mouth 4 (four) times daily.   ibuprofen 200 MG tablet Commonly known as:  ADVIL,MOTRIN Take 200 mg by mouth every 6 (six) hours as needed for headache (pain).   insulin aspart 100 UNIT/ML FlexPen Commonly known as:  NOVOLOG Inject 25 Units into the skin 3 (three) times daily.   Insulin Degludec 200 UNIT/ML Sopn Inject 140 Units into the skin daily.   linagliptin 5 MG Tabs tablet Commonly known as:  TRADJENTA Take 1 tablet (5 mg total) by mouth daily.   loratadine 10 MG tablet Commonly known as:   CLARITIN Take 10 mg by mouth daily as needed (seasonal allergies).   lovastatin 20 MG tablet Commonly known as:  MEVACOR Take 1 tablet (20 mg total) by mouth daily.   metolazone 2.5 MG tablet Commonly  known as:  ZAROXOLYN Take 2.5 mg by mouth every other day.   Pen Needles 3/16" 31G X 5 MM Misc Use five times daily   polyethylene glycol packet Commonly known as:  MIRALAX / GLYCOLAX Take 17 g by mouth daily as needed for mild constipation.   promethazine 12.5 MG tablet Commonly known as:  PHENERGAN Take 1 tablet (12.5 mg total) by mouth every 8 (eight) hours as needed for nausea or vomiting.   senna-docusate 8.6-50 MG tablet Commonly known as:  Senokot-S Take 1 tablet by mouth daily.   Zinc Gluconate 100 MG Tabs Take 2.2 tablets (220 mg total) by mouth daily.   zinc sulfate 220 (50 Zn) MG capsule Take 220 mg by mouth daily.       Allergies:  Allergies  Allergen Reactions  . Reglan [Metoclopramide] Other (See Comments)    Dysphoric reaction    Past Medical History:  Diagnosis Date  . Acute venous embolism and thrombosis of deep vessels of proximal lower extremity ([facility]) 07/19/2011  . Anemia   . Chest pain, neg MI, normal coronaries by cath 02/18/2013  . CKD (chronic kidney disease) stage 3, GFR 30-59 ml/min (H[facility]) 02/19/2013  . Colon polyps   . Diabetic ulcer of right foot ([place])   . DVT (deep venous thrombosis) ([facility]) 09/2002   patient reports additional DVTs in '06 & '11 (unconfirmed)  . GERD (gastroesophageal reflux disease)   . History of blood transfusion    "related to OR" (10/31/2016)  . Hyperlipidemia 02/19/2013  . Hypertension   . Nausea & vomiting    "constant for the last couple weeks" (10/31/2016)  . Nephrotic syndrome   . Obesity    BMI 44, weight 346 pounds 01/30/14  . Pulmonary embolism ([facility]) 09/2002   treated with 6 months of warfarin  . Type I diabetes mellitus ([facility]) dx'd 2001    Past Surgical History:  Procedure Laterality Date  . AMPUTATION  Right 12/22/2013   Procedure: AMPUTATION BELOW KNEE;  Surgeon: [name], MD;  Location: [facility];  Service: Orthopedics;  Laterality: Right;  . AMPUTATION Right 02/19/2014   Procedure: RIGHT ABOVE KNEE AMPUTATION ;  Surgeon: [name], MD;  Location: [facility];  Service: Orthopedics;  Laterality: Right;  . CARDIAC CATHETERIZATION  02/18/2013   normal coronaries  . ESOPHAGOGASTRODUODENOSCOPY N/A 11/02/2016   Procedure: ESOPHAGOGASTRODUODENOSCOPY (EGD);  Surgeon: [name], MD;  Location: [facility] ENDOSCOPY;  Service: Endoscopy;  Laterality: N/A;  . I&D EXTREMITY Right 12/14/2013   Procedure: IRRIGATION AND DEBRIDEMENT RIGHT FOOT;  Surgeon: [name], MD;  Location: [facility];  Service: Orthopedics;  Laterality: Right;  . INCISION AND DRAINAGE ABSCESS  2007; 2015   "back"  . INCISION AND DRAINAGE OF WOUND Right 12/22/2013   Procedure: I&D RIGHT BUTTOCK;  Surgeon: [name], MD;  Location: [facility];  Service: Orthopedics;  Laterality: Right;  . LEFT HEART CATHETERIZATION WITH CORONARY ANGIOGRAM N/A 02/18/2013   Procedure: LEFT HEART CATHETERIZATION WITH CORONARY ANGIOGRAM;  Surgeon: [name], MD;  Location[facility] CATH LAB;  Service: Cardiovascular;  Laterality: N/A;    Family History  Problem Relation Age of Onset  . Heart disease Mother   . Diabetes Mother   . Diabetes Maternal Uncle   . Diabetes Cousin     Social History:  reports that he has never smoked. He has never used smokeless tobacco. He reports that he drinks alcohol. He reports that he does not use drugs.   Review of Systems  VOMITING: He isCurrently having vomiting as discussed above and he thinks once he had coffee-ground emesis this week Today did not have any coffee grounds in his vomiting in the office today  LOW TESTOSTERONE: Earlier this year when he was in the rehab facility he was told that he had a low testosterone and was started on testosterone injections He was taking 50 mg every 2 weeks With this he thinks he  felt more energetic However he stopped taking the injection because of fear of getting blood clots which he has had several times.   Reportedly he has had normal prolactin levels and LH but no results are available  Free testosterone level is now normal  Lipid history: On Mevacor with good control    Lab Results  Component Value Date   CHOL 150 06/05/2017   HDL 33.30 (L) 06/05/2017   LDLCALC 91 06/05/2017   TRIG 127.0 06/05/2017   CHOLHDL 4 06/05/2017           Hypertension: He is taking clonidine, Norvasc and Coreg  BP Readings from Last 3 Encounters:  06/14/17 (!) 154/80  06/10/17 (!) 161/87  06/08/17 (!) 142/90    Most recent eye exam was in 2018  Most recent foot exam: 7/79   Complications of diabetes: Nephropathy with renal insufficiency and proteinuria, unknown status of retinopathy, erectile dysfunction  LABS:  No visits with results within 1 Week(s) from this visit.  Latest known visit with results is:  Admission on 06/08/2017, Discharged on 06/10/2017  Component Date Value Ref Range Status  . Lipase 06/08/2017 87* 11 - 51 U/L Final   Performed at [facility] [address], [place], [place] [address]  . Sodium 06/08/2017 137  135 - 145 mmol/L Final  . Potassium 06/08/2017 4.4  3.5 - 5.1 mmol/L Final  . Chloride 06/08/2017 103  101 - 111 mmol/L Final  . CO2 06/08/2017 26  22 - 32 mmol/L Final  . Glucose, Bld 06/08/2017 387* 65 - 99 mg/dL Final  . BUN 06/08/2017 38* 6 - 20 mg/dL Final  . Creatinine, Ser 06/08/2017 2.22* 0.61 - 1.24 mg/dL Final  . Calcium 06/08/2017 8.6* 8.9 - 10.3 mg/dL Final  . Total Protein 06/08/2017 6.4* 6.5 - 8.1 g/dL Final  . Albumin 06/08/2017 2.9* 3.5 - 5.0 g/dL Final  . AST 06/08/2017 18  15 - 41 U/L Final  . ALT 06/08/2017 19  17 - 63 U/L Final  . Alkaline Phosphatase 06/08/2017 111  38 - 126 U/L Final  . Total Bilirubin 06/08/2017 0.4  0.3 - 1.2 mg/dL Final  . GFR calc non Af Amer 06/08/2017 35* >60 mL/min Final  . GFR  calc Af Amer 06/08/2017 41* >60 mL/min Final   Comment: (NOTE) The eGFR has been calculated using the CKD EPI equation. This calculation has not been validated in all clinical situations. eGFR's persistently <60 mL/min signify possible Chronic Kidney Disease.   Georgiann Hahn gap 06/08/2017 8  5 - 15 Final   Performed at [facility], [place] [address] [place], [place] [address]  . WBC 06/08/2017 8.1  4.0 - 10.5 K/uL Final  . RBC 06/08/2017 4.23  4.22 - 5.81 MIL/uL Final  . Hemoglobin 06/08/2017 11.8* 13.0 - 17.0 g/dL Final  . HCT 06/08/2017 36.8* 39.0 - 52.0 % Final  . MCV 06/08/2017 87.0  78.0 - 100.0 fL Final  . MCH 06/08/2017 27.9  26.0 - 34.0 pg Final  . MCHC 06/08/2017 32.1  30.0 -  36.0 g/dL Final  . RDW 06/08/2017 13.1  11.5 - 15.5 % Final  . Platelets 06/08/2017 250  150 - 400 K/uL Final   Performed at [facility], [place] [address], [place], [place] [address]  . Color, Urine 06/08/2017 YELLOW  YELLOW Final  . APPearance 06/08/2017 CLEAR  CLEAR Final  . Specific Gravity, Urine 06/08/2017 1.014  1.005 - 1.030 Final  . pH 06/08/2017 5.0  5.0 - 8.0 Final  . Glucose, UA 06/08/2017 >=500* NEGATIVE mg/dL Final  . Hgb urine dipstick 06/08/2017 MODERATE* NEGATIVE Final  . Bilirubin Urine 06/08/2017 NEGATIVE  NEGATIVE Final  . Ketones, ur 06/08/2017 NEGATIVE  NEGATIVE mg/dL Final  . Protein, ur 06/08/2017 >=300* NEGATIVE mg/dL Final  . Nitrite 06/08/2017 NEGATIVE  NEGATIVE Final  . Leukocytes, UA 06/08/2017 NEGATIVE  NEGATIVE Final  . RBC / HPF 06/08/2017 6-10  0 - 5 RBC/hpf Final  . WBC, UA 06/08/2017 0-5  0 - 5 WBC/hpf Final  . Bacteria, UA 06/08/2017 RARE* NONE SEEN Final  . Mucus 06/08/2017 PRESENT   Final  . Hyaline Casts, UA 06/08/2017 PRESENT   Final   Performed at [facility], [place] [address] [place], [place] [address]  . Glucose-Capillary 06/08/2017 261* 65 - 99 mg/dL Final  . Sodium 06/09/2017 140  135 - 145 mmol/L Final  . Potassium 06/09/2017 3.6  3.5 - 5.1  mmol/L Final  . Chloride 06/09/2017 109  101 - 111 mmol/L Final  . CO2 06/09/2017 25  22 - 32 mmol/L Final  . Glucose, Bld 06/09/2017 97  65 - 99 mg/dL Final  . BUN 06/09/2017 33* 6 - 20 mg/dL Final  . Creatinine, Ser 06/09/2017 2.05* 0.61 - 1.24 mg/dL Final  . Calcium 06/09/2017 8.3* 8.9 - 10.3 mg/dL Final  . GFR calc non Af Amer 06/09/2017 39* >60 mL/min Final  . GFR calc Af Amer 06/09/2017 45* >60 mL/min Final   Comment: (NOTE) The eGFR has been calculated using the CKD EPI equation. This calculation has not been validated in all clinical situations. eGFR's persistently <60 mL/min signify possible Chronic Kidney Disease.   Georgiann Hahn gap 06/09/2017 6  5 - 15 Final   Performed at [facility], [place] [address], [place], [place] [address]  . WBC 06/09/2017 7.5  4.0 - 10.5 K/uL Final  . RBC 06/09/2017 4.03* 4.22 - 5.81 MIL/uL Final  . Hemoglobin 06/09/2017 11.3* 13.0 - 17.0 g/dL Final  . HCT 06/09/2017 35.2* 39.0 - 52.0 % Final  . MCV 06/09/2017 87.3  78.0 - 100.0 fL Final  . MCH 06/09/2017 28.0  26.0 - 34.0 pg Final  . MCHC 06/09/2017 32.1  30.0 - 36.0 g/dL Final  . RDW 06/09/2017 13.2  11.5 - 15.5 % Final  . Platelets 06/09/2017 231  150 - 400 K/uL Final   Performed at [facility], [place] [address], [place], [place] [address]  . Glucose-Capillary 06/08/2017 265* 65 - 99 mg/dL Final  . Glucose-Capillary 06/09/2017 173* 65 - 99 mg/dL Final  . Comment 1 06/09/2017 Notify RN   Final  . Comment 2 06/09/2017 Document in Chart   Final  . Glucose-Capillary 06/09/2017 95  65 - 99 mg/dL Final  . Glucose-Capillary 06/09/2017 98  65 - 99 mg/dL Final  . Glucose-Capillary 06/09/2017 179* 65 - 99 mg/dL Final  . Comment 1 06/09/2017 Notify RN   Final  . Comment 2 06/09/2017 Document in Chart   Final  . Sodium 06/10/2017 137  135 - 145 mmol/L Final  . Potassium 06/10/2017 3.5  3.5 - 5.1 mmol/L Final  . Chloride 06/10/2017 106  101 - 111 mmol/L Final  . CO2 06/10/2017 27  22 - 32 mmol/L  Final  . Glucose, Bld 06/10/2017 48* 65 - 99 mg/dL Final  . BUN 06/10/2017 33* 6 - 20 mg/dL Final  . Creatinine, Ser 06/10/2017 2.16* 0.61 - 1.24 mg/dL Final  . Calcium 06/10/2017 8.2* 8.9 - 10.3 mg/dL Final  . GFR calc non Af Amer 06/10/2017 36* >60 mL/min Final  . GFR calc Af Amer 06/10/2017 42* >60 mL/min Final   Comment: (NOTE) The eGFR has been calculated using the CKD EPI equation. This calculation has not been validated in all clinical situations. eGFR's persistently <60 mL/min signify possible Chronic Kidney Disease.   Georgiann Hahn gap 06/10/2017 4* 5 - 15 Final   Performed at [facility], [place] [address] [place] [address], [place], [place] [address]  . Glucose-Capillary 06/09/2017 214* 65 - 99 mg/dL Final  . Glucose-Capillary 06/09/2017 192* 65 - 99 mg/dL Final  . Comment 1 06/09/2017 Document in Chart   Final  . Glucose-Capillary 06/10/2017 151* 65 - 99 mg/dL Final  . Comment 1 06/10/2017 Document in Chart   Final  . Glucose-Capillary 06/10/2017 53* 65 - 99 mg/dL Final  . Comment 1 06/10/2017 Document in Chart   Final  . Glucose-Capillary 06/10/2017 56* 65 - 99 mg/dL Final  . Comment 1 06/10/2017 Document in Chart   Final  . Glucose-Capillary 06/10/2017 62* 65 - 99 mg/dL Final  . Comment 1 06/10/2017 Document in Chart   Final  . Glucose-Capillary 06/10/2017 52* 65 - 99 mg/dL Final  . Glucose-Capillary 06/10/2017 164* 65 - 99 mg/dL Final  . Glucose-Capillary 06/10/2017 157* 65 - 99 mg/dL Final  . Glucose-Capillary 06/10/2017 165* 65 - 99 mg/dL Final    Physical Examination:  There were no vitals taken for this visit.      ASSESSMENT:  Diabetes type 2, with morbid obesity  See history of present illness for detailed discussion of current diabetes management, blood sugar patterns and problems identified  A1c is down to 6.2 He is now on a regimen of basal insulin, also Ozempic at the moment  Although this is probably related to his adding Ozempic to his regimen do not appear to be  tolerating this Has not taken any mealtime insulin Still continues to exercise regularly He is not apparently having high readings but did not bring his monitor and not sure if he is still having  hyperglycemia   without taking NovoLog at meals Most likely since he is eating much less what has decreased appetite does not need any NovoLog at the moment   Question of hypogonadism: His free testosterone is normal  Continue vomiting, some coffee-ground emesis: He needs to be seen by his PCP or go to the emergency room for evaluation and treatment   PLAN:    He will not take Ozempic until his next visit  Check blood sugars consistently after meals and bring monitor for download on each visit  May consider trying Victoza and graduated doses on the next visit if having high postprandial readings, he has tolerated this before  We will continue follow-up with his PCP for other issues  There are no Patient Instructions on file for this visit.     [name] 07/08/2017, 8:46 PM   Note: This office note was prepared with Dragon voice recognition system technology. Any transcriptional errors  that result from this process are unintentional.

## 2017-07-09 ENCOUNTER — Ambulatory Visit: Payer: Medicare Other | Admitting: Endocrinology

## 2017-07-10 ENCOUNTER — Telehealth: Payer: Self-pay | Admitting: Internal Medicine

## 2017-07-10 NOTE — Telephone Encounter (Signed)
Should be seen in the [facility] as this sounds like a new complaint.  However, he could always call his surgeon if he would like to go there directly.

## 2017-07-10 NOTE — Telephone Encounter (Signed)
Patient requesting a Referral to a Surgeon for his Stump.  Patient states his  Right Stump is hanging down more and is causing pain and discomfort.  Please advise if patient needs an appointment.

## 2017-07-11 ENCOUNTER — Other Ambulatory Visit: Payer: Self-pay | Admitting: Internal Medicine

## 2017-07-16 NOTE — Telephone Encounter (Signed)
Message was left for the patient to contact our office if he would like to be seen or, he could call his Surgeons office as suggested.

## 2017-07-19 ENCOUNTER — Other Ambulatory Visit: Payer: Self-pay

## 2017-07-19 ENCOUNTER — Encounter: Payer: Self-pay | Admitting: Internal Medicine

## 2017-07-19 ENCOUNTER — Ambulatory Visit (INDEPENDENT_AMBULATORY_CARE_PROVIDER_SITE_OTHER): Payer: Medicare Other | Admitting: Internal Medicine

## 2017-07-19 VITALS — BP 129/72 | HR 75 | Temp 98.4°F

## 2017-07-19 DIAGNOSIS — S78111A Complete traumatic amputation at level between right hip and knee, initial encounter: Secondary | ICD-10-CM

## 2017-07-19 DIAGNOSIS — E785 Hyperlipidemia, unspecified: Secondary | ICD-10-CM | POA: Diagnosis not present

## 2017-07-19 DIAGNOSIS — Z86718 Personal history of other venous thrombosis and embolism: Secondary | ICD-10-CM | POA: Diagnosis not present

## 2017-07-19 DIAGNOSIS — N183 Chronic kidney disease, stage 3 (moderate): Secondary | ICD-10-CM

## 2017-07-19 DIAGNOSIS — Z89611 Acquired absence of right leg above knee: Secondary | ICD-10-CM

## 2017-07-19 DIAGNOSIS — E1122 Type 2 diabetes mellitus with diabetic chronic kidney disease: Secondary | ICD-10-CM | POA: Diagnosis not present

## 2017-07-19 DIAGNOSIS — I129 Hypertensive chronic kidney disease with stage 1 through stage 4 chronic kidney disease, or unspecified chronic kidney disease: Secondary | ICD-10-CM | POA: Diagnosis not present

## 2017-07-19 NOTE — Progress Notes (Signed)
    CC: Management of his Above Knee Amputation stump  HPI: Mr.[name] Devonshire is a 42 y.o. M w/ PMH multiple DVTs, T2DM, HTN, HLD, CKD stage 3 visiting the clinic requesting a referral for an orthopedic appointment. He states he has been working with a Fish farm manager to lose weight and has been successful in losing weight for the last 3 months. He states his recent weight loss is causing the skin around his stump to sag cause his prosthesis to fit improperly. This is causing pain while ambulating and he is scheduled to see his prosthesists soon for a new fitting. However, he is requesting a referral for orthopedic for stump revision. He is specifically asking for Dr.Duda. He denies any rash, bleeding, uclers, neuropathic pain or loss of sensation around the stump.   Past Medical History:  Diagnosis Date  . Acute venous embolism and thrombosis of deep vessels of proximal lower extremity ([place]) 07/19/2011  . Anemia   . Chest pain, neg MI, normal coronaries by cath 02/18/2013  . CKD (chronic kidney disease) stage 3, GFR 30-59 ml/min ([facility]) 02/19/2013  . Colon polyps   . Diabetic ulcer of right foot ([place])   . DVT (deep venous thrombosis) ([facility]) 09/2002   patient reports additional DVTs in '06 & '11 (unconfirmed)  . GERD (gastroesophageal reflux disease)   . History of blood transfusion    "related to OR" (10/31/2016)  . Hyperlipidemia 02/19/2013  . Hypertension   . Nausea & vomiting    "constant for the last couple weeks" (10/31/2016)  . Nephrotic syndrome   . Obesity    BMI 44, weight 346 pounds 01/30/14  . Pulmonary embolism ([facility]) 09/2002   treated with 6 months of warfarin  . Type I diabetes mellitus ([facility] [facility]) dx'd 2001    Review of Systems: Review of Systems  Constitutional: Positive for weight loss (intentional). Negative for chills and fever.  Respiratory: Negative for cough, shortness of breath and wheezing.   Cardiovascular: Positive for leg swelling. Negative for chest pain, palpitations  and claudication.  Neurological: Negative for dizziness, tingling, sensory change, weakness and headaches.    Physical Exam: Vitals:   07/19/17 1440  BP: 129/72  Pulse: 75  Temp: 98.4 F (36.9 C)  TempSrc: Oral  SpO2: 100%   Physical Exam  Constitutional: He is oriented to person, place, and time. He appears well-developed and well-nourished.  Morbidly obese AAM in wheelchair  Cardiovascular: Normal rate, regular rhythm, normal heart sounds and intact distal pulses.  Respiratory: Effort normal and breath sounds normal. No respiratory distress.  GI: Soft. Bowel sounds are normal. He exhibits no distension. There is no tenderness. There is no guarding.  Musculoskeletal:  Right leg with above knee amputation. Stump shows surgical scars w/o dehiscence, bleeding, rashes or ulcers. Lot of loose skin around the stump  Neurological: He is alert and oriented to person, place, and time. Coordination normal.  Skin: Skin is warm and dry. No rash noted. No erythema.     Assessment & Plan:   See Encounters Tab for problem based charting.  Patient seen with Dr. Daryll Drown   -Gilberto Better, PGY1

## 2017-07-19 NOTE — Patient Instructions (Signed)
Dear Candis Shine  Thank you so much for visiting [place]. We have put in a referral for Dr.Duda with [facility] orthopedics. Please let us know if the referral  Has any problems. Hope to see you soon  -Gilberto Better

## 2017-07-20 ENCOUNTER — Encounter: Payer: Self-pay | Admitting: Internal Medicine

## 2017-07-20 DIAGNOSIS — E1122 Type 2 diabetes mellitus with diabetic chronic kidney disease: Secondary | ICD-10-CM | POA: Diagnosis not present

## 2017-07-20 NOTE — Progress Notes (Signed)
Internal Medicine Clinic Attending  I saw and evaluated the patient.  I personally confirmed the key portions of the history and exam documented by Dr. Lee and I reviewed pertinent patient test results.  The assessment, diagnosis, and plan were formulated together and I agree with the documentation in the resident's note.  

## 2017-07-20 NOTE — Assessment & Plan Note (Signed)
-   Hx of BKA and revision AKA due to right foot cellulitis & osteomyelitis as complication of diabetes - Surgery done in 2016  - States his skin around the stump has been sagging and interfering with his prosthesis fitting and ambulation - Upcoming prosthesist appt for new cover - Would like ortho referral for stump revision - Specifically requesting Dr.Duda - Physical Exam: right above knee amputation w/ stump showing surgical scar w/o ulcers, rashes, bleeding - Will put in ortho referral for evaluation

## 2017-07-24 NOTE — Addendum Note (Signed)
Addended by: Hulan Fray on: 07/24/2017 06:29 PM   Modules accepted: Orders

## 2017-07-30 ENCOUNTER — Ambulatory Visit (INDEPENDENT_AMBULATORY_CARE_PROVIDER_SITE_OTHER): Payer: Medicare Other | Admitting: Orthopedic Surgery

## 2017-07-30 ENCOUNTER — Encounter (INDEPENDENT_AMBULATORY_CARE_PROVIDER_SITE_OTHER): Payer: Self-pay | Admitting: Orthopedic Surgery

## 2017-07-30 VITALS — Ht 74.0 in | Wt 363.0 lb

## 2017-07-30 DIAGNOSIS — Z89611 Acquired absence of right leg above knee: Secondary | ICD-10-CM | POA: Diagnosis not present

## 2017-07-30 DIAGNOSIS — S78111A Complete traumatic amputation at level between right hip and knee, initial encounter: Secondary | ICD-10-CM

## 2017-07-31 ENCOUNTER — Encounter (INDEPENDENT_AMBULATORY_CARE_PROVIDER_SITE_OTHER): Payer: Self-pay | Admitting: Orthopedic Surgery

## 2017-07-31 NOTE — Progress Notes (Signed)
Office Visit Note   Patient: [name]           Date of Birth: [birth year]           MRN: [id number] Visit Date: 07/30/2017              Requested by: Mosetta Anis, MD [address], [place] [id number] PCP: [name], DO  Chief Complaint  Patient presents with  . Right Leg - Pain      HPI: Patient is a 42 year old gentleman who presents status post right above-the-knee amputation and 2016.  Patient complains of loose redundant tissue which makes his prosthesis painful to wear.  He states that Fritz Pickerel at [employer] has currently modified a new socket.  Assessment & Plan: Visit Diagnoses:  1. Above knee amputation of right lower extremity ([place])     Plan: Recommending proceeding with the new socket and see how he progresses.  Discussed that if he is still painful we could proceed with revision of the redundant tissue however discussed that he would be off his leg for about 2 months to allow for the residual limb to heal and that it is always possible that he could have recurrent redundant tissue due to lymphatic insufficiency and swelling.  Patient states he understands will follow-up as needed.  Follow-Up Instructions: Return if symptoms worsen or fail to improve.   Ortho Exam  Patient is alert, oriented, no adenopathy, well-dressed, normal affect, normal respiratory effort. Examination patient has a well-healed right above-the-knee amputation.  He has significant amount of redundant tissue there is no tenderness to palpation over the distal femur no clinical signs of infection.  Imaging: No results found. No images are attached to the encounter.  Labs: Lab Results  Component Value Date   HGBA1C 6.2 06/05/2017   HGBA1C 7.9 02/15/2017   HGBA1C 6.3 10/31/2016   ESRSEDRATE 57 (H) 07/12/2011   CRP 8.07 (H) 07/12/2011   REPTSTATUS 01/15/2014 FINAL 01/13/2014   GRAMSTAIN  12/22/2013    RARE WBC PRESENT, PREDOMINANTLY PMN NO SQUAMOUS EPITHELIAL CELLS  SEEN NO ORGANISMS SEEN Performed at [place]  12/22/2013    RARE WBC PRESENT, PREDOMINANTLY PMN NO SQUAMOUS EPITHELIAL CELLS SEEN NO ORGANISMS SEEN Performed at [place] Performed at [facility]  01/13/2014   LABORGA METHICILLIN RESISTANT STAPHYLOCOCCUS AUREUS 12/22/2013     Lab Results  Component Value Date   ALBUMIN 2.9 (L) 06/08/2017   ALBUMIN 3.2 (L) 02/07/2017   ALBUMIN 2.8 (L) 10/31/2016    Body mass index is 46.61 kg/m.  Orders:  No orders of the defined types were placed in this encounter.  No orders of the defined types were placed in this encounter.    Procedures: No procedures performed  Clinical Data: No additional findings.  ROS:  All other systems negative, except as noted in the HPI. Review of Systems  Objective: Vital Signs: Ht 6\' 2"  (1.88 m)   Wt (!) 363 lb (164.7 kg)   BMI 46.61 kg/m   Specialty Comments:  No specialty comments available.  PMFS History: Patient Active Problem List   Diagnosis Date Noted  . Nausea and vomiting 06/08/2017  . Erectile dysfunction 04/22/2017  . Nocturnal enuresis 04/19/2017  . Blood in stool 03/07/2017  . History of nausea and vomiting 10/31/2016  . Prosthesis adjustment 08/17/2016  . History of pulmonary embolus (PE) 06/09/2016  . Normocytic anemia 01/30/2014  . Constipation   .  Above knee amputation of right lower extremity ([place]) 12/26/2013  . GERD (gastroesophageal reflux disease) 08/14/2013  . Hyperlipidemia 02/19/2013  . S/P cardiac cath, 02/18/13, normal coronaries 02/19/2013  . CKD (chronic kidney disease) stage 3, GFR 30-59 ml/min (HCC) 02/19/2013  . Nephrotic syndrome 02/12/2012  . Hypertension 07/24/2011  . Long term current use of anticoagulant therapy 07/19/2011  . Back pain 07/12/2011  . History of DVT (deep vein thrombosis) 07/28/2009  . PROTEINURIA, MILD 02/01/2007  . Morbid obesity ([place]) 10/26/2005  . DM (diabetes  mellitus) type I uncontrolled, periph vascular disorder ([place]) 01/09/2002   Past Medical History:  Diagnosis Date  . Acute venous embolism and thrombosis of deep vessels of proximal lower extremity ([place]) 07/19/2011  . Anemia   . Chest pain, neg MI, normal coronaries by cath 02/18/2013  . CKD (chronic kidney disease) stage 3, GFR 30-59 ml/min (HCC) 02/19/2013  . Colon polyps   . Diabetic ulcer of right foot ([place])   . DVT (deep venous thrombosis) ([place]) 09/2002   patient reports additional DVTs in '06 & '11 (unconfirmed)  . GERD (gastroesophageal reflux disease)   . History of blood transfusion    "related to OR" (10/31/2016)  . Hyperlipidemia 02/19/2013  . Hypertension   . Nausea & vomiting    "constant for the last couple weeks" (10/31/2016)  . Nephrotic syndrome   . Obesity    BMI 44, weight 346 pounds 01/30/14  . Pulmonary embolism ([place]) 09/2002   treated with 6 months of warfarin  . Type I diabetes mellitus ([place]) dx'd 2001    Family History  Problem Relation Age of Onset  . Heart disease Mother   . Diabetes Mother   . Diabetes Maternal Uncle   . Diabetes Cousin     Past Surgical History:  Procedure Laterality Date  . AMPUTATION Right 12/22/2013   Procedure: AMPUTATION BELOW KNEE;  Surgeon: [name], MD;  Location: [place];  Service: Orthopedics;  Laterality: Right;  . AMPUTATION Right 02/19/2014   Procedure: RIGHT ABOVE KNEE AMPUTATION ;  Surgeon: [name], MD;  Location: [place];  Service: Orthopedics;  Laterality: Right;  . CARDIAC CATHETERIZATION  02/18/2013   normal coronaries  . ESOPHAGOGASTRODUODENOSCOPY N/A 11/02/2016   Procedure: ESOPHAGOGASTRODUODENOSCOPY (EGD);  Surgeon: [name], MD;  [facility] END[facility]COPY;  Service: Endoscopy;  Laterality: N/A;  . I&D EXTREMITY Right 12/14/2013   Procedure: IRRIGATION AND DEBRIDEMENT RIGHT FOOT;  Surgeon: [name], MD;  Location: [place];  Service: Orthopedics;  Laterality: Right;  . INCISION AND DRAINAGE ABSCESS  2007;  2015   "back"  . INCISION AND DRAINAGE OF WOUND Right 12/22/2013   Procedure: I&D RIGHT BUTTOCK;  Surgeon: [name], MD;  Location: [place];  Service: Orthopedics;  Laterality: Right;  . LEFT HEART CATHETERIZATION WITH CORONARY ANGIOGRAM N/A 02/18/2013   Procedure: LEFT HEART CATHETERIZATION WITH CORONARY ANGIOGRAM;  Surgeon: [name], MD;  Location: [facility] CATH LAB;  Service: Cardiovascular;  Laterality: N/A;   Social History   Occupational History  . Occupation: [employer]  . Occupation: Lexicographer: [employer]   Tobacco Use  . Smoking status: Never Smoker  . Smokeless tobacco: Never Used  Substance and Sexual Activity  . Alcohol use: Yes    Comment: 10/31/2016 "a few beers q 6 months or so"  . Drug use: No  . Sexual activity: Yes

## 2017-08-01 ENCOUNTER — Other Ambulatory Visit: Payer: Self-pay | Admitting: *Deleted

## 2017-08-01 MED ORDER — INSULIN DEGLUDEC 200 UNIT/ML ~~LOC~~ SOPN: 140.0000 [IU] | PEN_INJECTOR | Freq: Every day | SUBCUTANEOUS | 3 refills | Status: DC

## 2017-08-01 NOTE — Progress Notes (Signed)
Patient ID: [name], male   DOB: [birth year], 42 y.o.   MRN: [id number]          Reason for Appointment: Follow-up for Type 2 Diabetes  Referring physician: Jannette Fogo   History of Present Illness:          Date of diagnosis of type 2 diabetes mellitus:  2001      His weight is about 400 pounds at the time of diagnosis He does not know what his initial blood sugars and circumstances were but he apparently was started on insulin initially He was not able to tolerate metformin because of diarrhea Subsequently has been on insulin only, level of control not available but his A1c was significantly high at 12.8 in 2015  Background history:    Recent history:   INSULIN regimen is:  140 units Tresiba, NovoLog 25 U tid     Non-insulin hypoglycemic drugs the patient is taking are: None  His A1c was 7.9 done in 2/19, in follow-up on 06/05/2017 was 6.2   Current management, blood sugar patterns and problems identified:  The Ozempic was stopped because of persistent nausea and abdominal pain, was admitted for pancreatitis also  He has been on NovoLog with meals again  Despite repeated reminders he does not bring his monitor for download and not clear how often he is checking his sugars  He again says that he may feel hypoglycemic late morning after his exercise sessions not as much if he has a protein drink with his exercise  No recent labs available to assess his level of control  He thinks his blood sugars in the mornings are around 100 he thinks also that he is checking some readings after meals and they are usually not over 150  Only when he goes off his diet which is infrequent he is having much higher sugars  Has had minimal diabetes education  He has taken his Tyler Aas consistently in the morning Difficult to assess his weight and it appears to be variable       Side effects from medications have been: Diarrhea with metformin, vomiting with Ozempic  Compliance with the  medical regimen: Good Hypoglycemia:   As above  Glucose monitoring:  done?  3 times a day         Glucometer:   ACCU-CHEK  Readings not available today: Usual range 72-155   Self-care: The diet that the patient has been following is: low carb meals, since March  2019   Typical meal intake: Breakfast is egg whites, some oatmeal and banana.  Usually small portions of meat or other protein at lunch and dinner and only a small amount of sweet potato at lunch or carbohydrate              Dietician visit, most recent: Unknown               Exercise:  Weights, resistance and some cardio at the gym at least 5 days a week, does 2 sessions of 1 hour each  Weight history:  Wt Readings from Last 3 Encounters:  07/30/17 (!) 363 lb (164.7 kg)  06/08/17 (!) 392 lb (177.8 kg)  04/24/17 (!) 392 lb 9.6 oz (178.1 kg)    Glycemic control:   Lab Results  Component Value Date   HGBA1C 6.2 06/05/2017   HGBA1C 7.9 02/15/2017   HGBA1C 6.3 10/31/2016   Lab Results  Component Value Date   MICROALBUR 160.61 (H) 08/14/2013   [place] 91 06/05/2017  CREATININE 2.16 (H) 06/10/2017   Lab Results  Component Value Date   MICRALBCREAT 818.2 (H) 08/14/2013    No results found for: FRUCTOSAMINE    Allergies as of 08/02/2017      Reactions   Reglan [metoclopramide] Other (See Comments)   Dysphoric reaction      Medication List        Accurate as of 08/02/17 12:58 PM. Always use your most recent med list.          ACCU-CHEK AVIVA device Use to check blood sugar 5 times daily. Diag code E10.51. Multiple insulin dependent   accu-chek softclix lancets Use to check blood sugars 5 times a day. Dx code:E11.9. Insulin dependent.   allopurinol 100 MG tablet Commonly known as:  ZYLOPRIM Take 2 tablets (200 mg total) by mouth daily.   Alpha-Lipoic Acid 200 MG Caps Take 1 capsule (200 mg total) by mouth daily.   amLODipine 5 MG tablet Commonly known as:  NORVASC Take 1 tablet (5 mg total) by  mouth daily.   apixaban 5 MG Tabs tablet Commonly known as:  ELIQUIS Take 1 tablet (5 mg total) by mouth 2 (two) times daily.   B-12 2500 MCG Subl Place 2,500 mcg under the tongue daily.   calcitRIOL 0.25 MCG capsule Commonly known as:  ROCALTROL Take 1 capsule (0.25 mcg total) by mouth daily.   carvedilol 25 MG tablet Commonly known as:  COREG Take 1 tablet (25 mg total) by mouth 2 (two) times daily with a meal.   cloNIDine 0.1 MG tablet Commonly known as:  CATAPRES Take 0.1 mg by mouth See admin instructions. Take one tablet (0.1 mg) by mouth with a 0.2 mg tablet for a total dose of 0.3 mg - twice daily   cloNIDine 0.2 MG tablet Commonly known as:  CATAPRES Take 1 tablet (0.2 mg total) by mouth 2 (two) times daily.   FISH OIL PO Take 1 capsule by mouth at bedtime.   furosemide 40 MG tablet Commonly known as:  LASIX Take 2 tablets (80 mg total) by mouth 2 (two) times daily.   gabapentin 300 MG capsule Commonly known as:  NEURONTIN Take 1 capsule (300 mg total) by mouth 3 (three) times daily.   glucose blood test strip Commonly known as:  ACCU-CHEK AVIVA Use to check blood sugars 5 times a day. Dx code:E11.9. Insulin dependent.   hydrALAZINE 50 MG tablet Commonly known as:  APRESOLINE Take 50 mg by mouth 4 (four) times daily.   ibuprofen 200 MG tablet Commonly known as:  ADVIL,MOTRIN Take 200 mg by mouth every 6 (six) hours as needed for headache (pain).   insulin aspart 100 UNIT/ML FlexPen Commonly known as:  NOVOLOG Inject 25 Units into the skin 3 (three) times daily.   Insulin Degludec 200 UNIT/ML Sopn Inject 140 Units into the skin daily.   linagliptin 5 MG Tabs tablet Commonly known as:  TRADJENTA Take 1 tablet (5 mg total) by mouth daily.   loratadine 10 MG tablet Commonly known as:  CLARITIN Take 10 mg by mouth daily as needed (seasonal allergies).   lovastatin 20 MG tablet Commonly known as:  MEVACOR Take 1 tablet (20 mg total) by mouth  daily.   metolazone 2.5 MG tablet Commonly known as:  ZAROXOLYN Take 2.5 mg by mouth every other day.   Pen Needles 3/16" 31G X 5 MM Misc Use five times daily   polyethylene glycol packet Commonly known as:  MIRALAX / GLYCOLAX Take 17 g  by mouth daily as needed for mild constipation.   promethazine 12.5 MG tablet Commonly known as:  PHENERGAN Take 1 tablet (12.5 mg total) by mouth every 8 (eight) hours as needed for nausea or vomiting.   senna-docusate 8.6-50 MG tablet Commonly known as:  Senokot-S Take 1 tablet by mouth daily.   Zinc Gluconate 100 MG Tabs Take 2.2 tablets (220 mg total) by mouth daily.   zinc sulfate 220 (50 Zn) MG capsule Take 220 mg by mouth daily.       Allergies:  Allergies  Allergen Reactions  . Reglan [Metoclopramide] Other (See Comments)    Dysphoric reaction    Past Medical History:  Diagnosis Date  . Acute venous embolism and thrombosis of deep vessels of proximal lower extremity ([employer] [employer]) 07/19/2011  . Anemia   . Chest pain, neg MI, normal coronaries by cath 02/18/2013  . CKD (chronic kidney disease) stage 3, GFR 30-59 ml/min (HCC) 02/19/2013  . Colon polyps   . Diabetic ulcer of right foot ([place])   . DVT (deep venous thrombosis) ([place]) 09/2002   patient reports additional DVTs in '06 & '11 (unconfirmed)  . GERD (gastroesophageal reflux disease)   . History of blood transfusion    "related to OR" (10/31/2016)  . Hyperlipidemia 02/19/2013  . Hypertension   . Nausea & vomiting    "constant for the last couple weeks" (10/31/2016)  . Nephrotic syndrome   . Obesity    BMI 44, weight 346 pounds 01/30/14  . Pulmonary embolism ([place]) 09/2002   treated with 6 months of warfarin  . Type I diabetes mellitus ([place]) dx'd 2001    Past Surgical History:  Procedure Laterality Date  . AMPUTATION Right 12/22/2013   Procedure: AMPUTATION BELOW KNEE;  Surgeon: [name], MD;  Location: [place];  Service: Orthopedics;  Laterality: Right;  . AMPUTATION Right  02/19/2014   Procedure: RIGHT ABOVE KNEE AMPUTATION ;  Surgeon: [name], MD;  Location: [place];  Service: Orthopedics;  Laterality: Right;  . CARDIAC CATHETERIZATION  02/18/2013   normal coronaries  . ESOPHAGOGASTRODUODENOSCOPY N/A 11/02/2016   Procedure: ESOPHAGOGASTRODUODENOSCOPY (EGD);  Surgeon: [name], MD;  Location: [facility] ENDOSCOPY;  Service: Endoscopy;  Laterality: N/A;  . I&D EXTREMITY Right 12/14/2013   Procedure: IRRIGATION AND DEBRIDEMENT RIGHT FOOT;  Surgeon: [name], MD;  Location: [place];  Service: Orthopedics;  Laterality: Right;  . INCISION AND DRAINAGE ABSCESS  2007; 2015   "back"  . INCISION AND DRAINAGE OF WOUND Right 12/22/2013   Procedure: I&D RIGHT BUTTOCK;  Surgeon: [name], MD;  Location: [place];  Service: Orthopedics;  Laterality: Right;  . LEFT HEART CATHETERIZATION WITH CORONARY ANGIOGRAM N/A 02/18/2013   Procedure: LEFT HEART CATHETERIZATION WITH CORONARY ANGIOGRAM;  Surgeon: [name], MD;  Location: [facility] CATH LAB;  Service: Cardiovascular;  Laterality: N/A;    Family History  Problem Relation Age of Onset  . Heart disease Mother   . Diabetes Mother   . Diabetes Maternal Uncle   . Diabetes Cousin     Social History:  reports that he has never smoked. He has never used smokeless tobacco. He reports that he drinks alcohol. He reports that he does not use drugs.   Review of Systems    LOW TESTOSTERONE: Earlier this year when he was in the rehab facility he was told that he had a low testosterone and was started on testosterone injections He was taking 50 mg every 2 weeks With this he thinks he  felt more energetic However he stopped taking the injection because of fear of getting blood clots which he has had several times.   Reportedly he has had normal prolactin levels and LH but no results are available  Free testosterone level is subsequently normal  Lipid history: On Mevacor with good control as follows:    Lab Results  Component  Value Date   CHOL 150 06/05/2017   HDL 33.30 (L) 06/05/2017   LDLCALC 91 06/05/2017   TRIG 127.0 06/05/2017   CHOLHDL 4 06/05/2017           Hypertension: He is taking clonidine, Norvasc and Coreg  BP Readings from Last 3 Encounters:  08/02/17 (!) 142/80  07/19/17 129/72  06/14/17 (!) 154/80    Most recent eye exam was in 2018  Most recent foot exam: 1/30   Complications of diabetes: Nephropathy with renal insufficiency and proteinuria, unknown status of retinopathy, erectile dysfunction  LABS:  Office Visit on 08/02/2017  Component Date Value Ref Range Status  . POC Glucose 08/02/2017 144* 70 - 99 mg/dl Final    Physical Examination:  BP (!) 142/80 (BP Location: Left Arm, Patient Position: Sitting, Cuff Size: Normal)   Pulse 87   Ht 6\' 2"  (1.88 m)   SpO2 98%   BMI 46.61 kg/m       ASSESSMENT:  Diabetes type 2, with morbid obesity  See history of present illness for detailed discussion of current diabetes management, blood sugar patterns and problems identified  Unable to assess his diabetes control as he did not bring his monitor and no labs available since his A1c of 6.2 done in May  He did have significant vomiting and GI side effects with Ozempic However this will help in his sugar control significantly and was able to get off mealtime NovoLog  He is reporting fairly good blood sugars at home  CKD: Followed by a nephrologist  PLAN:    Since he had tolerated VICTOZA well previously without any adverse effects and he should help him reduce postprandial hyperglycemia and the need for NovoLog he will go back on this  Start with 0.6 mg for the first week and go up to 1.2 after 1 week if no nausea  Discussed cardiovascular benefits long-term with this also  He was shown how to use the dose of pain  Most likely he can stop NovoLog with going to the 1.2 mg Victoza but needs to keep a close check on his readings  Again reminded that he needs to bring his  monitor for review on each visit  A1c to be again checked on the next time  No change in TRESIBA as yet  Continue balanced meals with low carbohydrates  There are no Patient Instructions on file for this visit.     [name] 08/02/2017, 12:58 PM   Note: This office note was prepared with Dragon voice recognition system technology. Any transcriptional errors that result from this process are unintentional.

## 2017-08-02 ENCOUNTER — Encounter: Payer: Self-pay | Admitting: Endocrinology

## 2017-08-02 ENCOUNTER — Ambulatory Visit (INDEPENDENT_AMBULATORY_CARE_PROVIDER_SITE_OTHER): Payer: Medicare Other | Admitting: Endocrinology

## 2017-08-02 VITALS — BP 142/80 | HR 87 | Ht 74.0 in

## 2017-08-02 DIAGNOSIS — Z794 Long term (current) use of insulin: Secondary | ICD-10-CM | POA: Diagnosis not present

## 2017-08-02 DIAGNOSIS — E1165 Type 2 diabetes mellitus with hyperglycemia: Secondary | ICD-10-CM | POA: Diagnosis not present

## 2017-08-02 LAB — GLUCOSE, POCT (MANUAL RESULT ENTRY): POC Glucose: 144 mg/dl — AB (ref 70–99)

## 2017-08-02 MED ORDER — VICTOZA 18 MG/3ML ~~LOC~~ SOPN: 1.2000 mg | PEN_INJECTOR | Freq: Every day | SUBCUTANEOUS | 3 refills | Status: DC

## 2017-08-02 NOTE — Patient Instructions (Addendum)
Start VICTOZA injection as shown once daily at the same time of the day.   Dial the dose to 0.6 mg on the pen for the first week.  You may inject in the stomach, thigh or arm. You may experience nausea in the first few days which usually goes away.  You will feel fullness of the stomach with starting the medication and should try to keep the portions at meals small.  After 1 week increase the dose to 1.2mg  daily if no nausea present.   If any questions or concerns are present call the office or the [place] helpline at [phone]. Visit http://www.wall.info/ for more useful information  If am sugar stays < 90 go to 130 on Tresiba  After 1 week if staying on 1.2 mg Victoza and then stop NovoLog completely   Check blood sugars on waking up  daily  Also check blood sugars about 2 hours after a meal and do this after different meals by rotation  Recommended blood sugar levels on waking up is 90-130 and about 2 hours after meal is 130-160  Please bring your blood sugar monitor to each visit, thank you

## 2017-08-03 NOTE — Telephone Encounter (Signed)
PT seen on 07/19/2017 by ACC.

## 2017-08-07 NOTE — Addendum Note (Signed)
Addended by: Truddie Crumble on: 08/07/2017 11:09 AM   Modules accepted: Orders

## 2017-08-08 ENCOUNTER — Ambulatory Visit: Payer: Medicare Other | Admitting: Endocrinology

## 2017-08-08 NOTE — Progress Notes (Signed)
   CC: Follow up on essential hypertension  HPI:  [name] is a 42 y.o. male with history noted below that presents to the Internal Medicine Clinic for follow up on essential hypertension.  Please see problem based charting for the status of patient's chronic medical conditions.    Past Medical History:  Diagnosis Date  . Acute venous embolism and thrombosis of deep vessels of proximal lower extremity ([place]) 07/19/2011  . Anemia   . Chest pain, neg MI, normal coronaries by cath 02/18/2013  . CKD (chronic kidney disease) stage 3, GFR 30-59 ml/min (HCC) 02/19/2013  . Colon polyps   . Diabetic ulcer of right foot ([place])   . DVT (deep venous thrombosis) ([place]) 09/2002   patient reports additional DVTs in '06 & '11 (unconfirmed)  . GERD (gastroesophageal reflux disease)   . History of blood transfusion    "related to OR" (10/31/2016)  . Hyperlipidemia 02/19/2013  . Hypertension   . Nausea & vomiting    "constant for the last couple weeks" (10/31/2016)  . Nephrotic syndrome   . Obesity    BMI 44, weight 346 pounds 01/30/14  . Pulmonary embolism ([place]) 09/2002   treated with 6 months of warfarin  . Type I diabetes mellitus ([place]) dx'd 2001    Review of Systems:  Review of Systems  Constitutional: Negative for malaise/fatigue.  Respiratory: Negative for shortness of breath.   Cardiovascular: Negative for chest pain.  Gastrointestinal: Positive for constipation.     Physical Exam:  Vitals:   08/14/17 1400 08/14/17 1430  BP: (!) 159/69 (!) 105/50  Pulse: 93 89  Temp: 98.2 F (36.8 C)   TempSrc: Oral   SpO2: 99%    Physical Exam  Constitutional: He is well-developed, well-nourished, and in no distress.  Cardiovascular: Normal rate, regular rhythm and normal heart sounds. Exam reveals no gallop and no friction rub.  No murmur heard. Pulmonary/Chest: Effort normal and breath sounds normal. No respiratory distress. He has no wheezes. He has no rales.     Assessment & Plan:    See encounters tab for problem based medical decision making.     Patient discussed with Dr. Evette Doffing

## 2017-08-09 DIAGNOSIS — Z86718 Personal history of other venous thrombosis and embolism: Secondary | ICD-10-CM | POA: Diagnosis not present

## 2017-08-09 DIAGNOSIS — N2581 Secondary hyperparathyroidism of renal origin: Secondary | ICD-10-CM | POA: Diagnosis not present

## 2017-08-09 DIAGNOSIS — D631 Anemia in chronic kidney disease: Secondary | ICD-10-CM | POA: Diagnosis not present

## 2017-08-09 DIAGNOSIS — Z6841 Body Mass Index (BMI) 40.0 and over, adult: Secondary | ICD-10-CM | POA: Diagnosis not present

## 2017-08-09 DIAGNOSIS — E1122 Type 2 diabetes mellitus with diabetic chronic kidney disease: Secondary | ICD-10-CM | POA: Diagnosis not present

## 2017-08-09 DIAGNOSIS — I129 Hypertensive chronic kidney disease with stage 1 through stage 4 chronic kidney disease, or unspecified chronic kidney disease: Secondary | ICD-10-CM | POA: Diagnosis not present

## 2017-08-09 DIAGNOSIS — Z794 Long term (current) use of insulin: Secondary | ICD-10-CM | POA: Diagnosis not present

## 2017-08-09 DIAGNOSIS — N183 Chronic kidney disease, stage 3 (moderate): Secondary | ICD-10-CM | POA: Diagnosis not present

## 2017-08-13 ENCOUNTER — Other Ambulatory Visit: Payer: Self-pay | Admitting: Internal Medicine

## 2017-08-14 ENCOUNTER — Encounter: Payer: Self-pay | Admitting: Internal Medicine

## 2017-08-14 ENCOUNTER — Ambulatory Visit (INDEPENDENT_AMBULATORY_CARE_PROVIDER_SITE_OTHER): Payer: Medicare Other | Admitting: Internal Medicine

## 2017-08-14 ENCOUNTER — Telehealth: Payer: Self-pay | Admitting: *Deleted

## 2017-08-14 VITALS — BP 105/50 | HR 89 | Temp 98.2°F

## 2017-08-14 DIAGNOSIS — Z0000 Encounter for general adult medical examination without abnormal findings: Secondary | ICD-10-CM

## 2017-08-14 DIAGNOSIS — Z23 Encounter for immunization: Secondary | ICD-10-CM | POA: Diagnosis not present

## 2017-08-14 DIAGNOSIS — K5900 Constipation, unspecified: Secondary | ICD-10-CM | POA: Diagnosis not present

## 2017-08-14 DIAGNOSIS — Z79899 Other long term (current) drug therapy: Secondary | ICD-10-CM

## 2017-08-14 DIAGNOSIS — I10 Essential (primary) hypertension: Secondary | ICD-10-CM | POA: Diagnosis not present

## 2017-08-14 MED ORDER — CLONIDINE HCL 0.3 MG PO TABS: 0.3000 mg | ORAL_TABLET | Freq: Two times a day (BID) | ORAL | 11 refills | Status: DC

## 2017-08-14 MED ORDER — SENNOSIDES-DOCUSATE SODIUM 8.6-50 MG PO TABS: 2.0000 | ORAL_TABLET | Freq: Every day | ORAL | 11 refills | Status: DC | PRN

## 2017-08-14 NOTE — Telephone Encounter (Signed)
Pt has an appt scheduled for today.  

## 2017-08-14 NOTE — Telephone Encounter (Signed)
Called pt to clarify Clonidine dosage - no answer; left message to call us back.

## 2017-08-14 NOTE — Patient Instructions (Signed)
[name],  It was a pleasure seeing you today.  Please follow up in 6 months.  Call if you need to be seen sooner.

## 2017-08-14 NOTE — Telephone Encounter (Signed)
LVM FOR PATIENT TO RETURN CALL TO  [facility]/ LAST SEEN BY [place] [facility] 2014/ NEEDING TO KNOW AS TO WHERE HE IS WANT TO GO TO BE SEEN FOR HIS DM EYE EXAM.

## 2017-08-15 DIAGNOSIS — Z0000 Encounter for general adult medical examination without abnormal findings: Secondary | ICD-10-CM | POA: Insufficient documentation

## 2017-08-15 NOTE — Assessment & Plan Note (Signed)
Assessment:  Healthcare maintenance  Patient due for tetanus/Tdap.  Offered in office and patient accepted.  Also due for ophthalmology exam and patient wanted referral.  Plan -opthalmology referral  -Tdap

## 2017-08-15 NOTE — Telephone Encounter (Signed)
Saw patient yesterday and clarified dose.  Med sent to pharmacy yesterday

## 2017-08-15 NOTE — Assessment & Plan Note (Signed)
Assessment: Essential hypertension Patient currently takes carvedilol 25mg  BID, lasix 80mg  BID, metolazone 2.5mg  every other day, clonidine 0.3 mg BID daily and amlodipine 5mg  daily. Today's blood pressure is 105/50.  Stable and at goal.  Patient denies dizziness or lightheadedness.  Plan -continue current blood pressure regimen

## 2017-08-16 ENCOUNTER — Encounter: Payer: Medicare Other | Admitting: Internal Medicine

## 2017-08-16 NOTE — Progress Notes (Signed)
Internal Medicine Clinic Attending  Case discussed with Dr. Hoffman at the time of the visit.  We reviewed the resident's history and exam and pertinent patient test results.  I agree with the assessment, diagnosis, and plan of care documented in the resident's note.  

## 2017-08-17 ENCOUNTER — Telehealth: Payer: Self-pay | Admitting: Internal Medicine

## 2017-08-17 NOTE — Telephone Encounter (Signed)
Needs a refill on cloNIDine (CATAPRES) 0.3 MG tablet, senna-docusate (SENOKOT-S) 8.6-50 MG tablet(stool softner) @ [place], pt contact# [phone].

## 2017-08-17 NOTE — Telephone Encounter (Signed)
Both prescriptions sent to pharmacy on 08/14/17-called to confirm with pharmacy, they have rx and will fill the clonidine, while the senokot is avail otc.  Will call patient to make him aware.Despina Hidden Cassady8/9/20193:49 PM

## 2017-08-20 NOTE — Telephone Encounter (Signed)
Dr Heber [place] is out. Clonidine filled 08/16/17 for one year. Noted below that senokot is OTC

## 2017-08-20 NOTE — Telephone Encounter (Signed)
Will resend this to dr Heber [place], spoke to pt and reminded him of 48 hr policy for refills

## 2017-08-22 ENCOUNTER — Other Ambulatory Visit: Payer: Self-pay

## 2017-08-22 NOTE — Telephone Encounter (Signed)
cloNIDine (CATAPRES) 0.3 MG tablet, and stool softener needs to be filled. Requesting to speak with a nurse about sleep med. Please call pt back.

## 2017-08-22 NOTE — Telephone Encounter (Signed)
I called [facility] - stated clonidine is ready and waiting for him to pick up; And senokot can be purchase OTC. Called pt - no answer; left message to call us back about his questions.

## 2017-08-24 DIAGNOSIS — H35033 Hypertensive retinopathy, bilateral: Secondary | ICD-10-CM | POA: Diagnosis not present

## 2017-08-24 DIAGNOSIS — E113393 Type 2 diabetes mellitus with moderate nonproliferative diabetic retinopathy without macular edema, bilateral: Secondary | ICD-10-CM | POA: Diagnosis not present

## 2017-08-24 LAB — HM DIABETES EYE EXAM

## 2017-08-27 ENCOUNTER — Other Ambulatory Visit: Payer: Self-pay | Admitting: Internal Medicine

## 2017-08-29 ENCOUNTER — Encounter: Payer: Self-pay | Admitting: *Deleted

## 2017-09-03 ENCOUNTER — Other Ambulatory Visit: Payer: Self-pay | Admitting: Internal Medicine

## 2017-09-03 DIAGNOSIS — H35031 Hypertensive retinopathy, right eye: Secondary | ICD-10-CM | POA: Diagnosis not present

## 2017-09-03 DIAGNOSIS — Q141 Congenital malformation of retina: Secondary | ICD-10-CM | POA: Diagnosis not present

## 2017-09-03 DIAGNOSIS — E113292 Type 2 diabetes mellitus with mild nonproliferative diabetic retinopathy without macular edema, left eye: Secondary | ICD-10-CM | POA: Diagnosis not present

## 2017-09-03 DIAGNOSIS — E113211 Type 2 diabetes mellitus with mild nonproliferative diabetic retinopathy with macular edema, right eye: Secondary | ICD-10-CM | POA: Diagnosis not present

## 2017-09-03 DIAGNOSIS — H35722 Serous detachment of retinal pigment epithelium, left eye: Secondary | ICD-10-CM | POA: Diagnosis not present

## 2017-09-03 DIAGNOSIS — H3581 Retinal edema: Secondary | ICD-10-CM | POA: Diagnosis not present

## 2017-09-04 ENCOUNTER — Other Ambulatory Visit: Payer: Self-pay | Admitting: Internal Medicine

## 2017-09-04 ENCOUNTER — Other Ambulatory Visit: Payer: Self-pay | Admitting: Student in an Organized Health Care Education/Training Program

## 2017-09-10 ENCOUNTER — Other Ambulatory Visit: Payer: Self-pay | Admitting: Internal Medicine

## 2017-09-11 ENCOUNTER — Encounter: Payer: Self-pay | Admitting: *Deleted

## 2017-09-11 ENCOUNTER — Telehealth: Payer: Self-pay | Admitting: Internal Medicine

## 2017-09-11 NOTE — Telephone Encounter (Signed)
Pt has now rec'd his Prosthetic Leg and is requesting a Referral be placed to the [facility] on [facility] as soon as possible.

## 2017-09-12 ENCOUNTER — Other Ambulatory Visit: Payer: Self-pay | Admitting: Internal Medicine

## 2017-09-12 DIAGNOSIS — S78111A Complete traumatic amputation at level between right hip and knee, initial encounter: Secondary | ICD-10-CM

## 2017-09-12 NOTE — Telephone Encounter (Signed)
Order placed, thanks.

## 2017-09-12 NOTE — Telephone Encounter (Signed)
Thanks

## 2017-09-17 ENCOUNTER — Other Ambulatory Visit (INDEPENDENT_AMBULATORY_CARE_PROVIDER_SITE_OTHER): Payer: Medicare Other

## 2017-09-17 DIAGNOSIS — E1165 Type 2 diabetes mellitus with hyperglycemia: Secondary | ICD-10-CM | POA: Diagnosis not present

## 2017-09-17 DIAGNOSIS — Z794 Long term (current) use of insulin: Secondary | ICD-10-CM | POA: Diagnosis not present

## 2017-09-17 LAB — COMPREHENSIVE METABOLIC PANEL
ALBUMIN: 3.4 g/dL — AB (ref 3.5–5.2)
ALK PHOS: 119 U/L — AB (ref 39–117)
ALT: 24 U/L (ref 0–53)
AST: 17 U/L (ref 0–37)
BUN: 55 mg/dL — ABNORMAL HIGH (ref 6–23)
CALCIUM: 8.8 mg/dL (ref 8.4–10.5)
CHLORIDE: 102 meq/L (ref 96–112)
CO2: 26 mEq/L (ref 19–32)
Creatinine, Ser: 2.29 mg/dL — ABNORMAL HIGH (ref 0.40–1.50)
GFR: 40.74 mL/min — AB (ref >60.00)
Glucose, Bld: 451 mg/dL — ABNORMAL HIGH (ref 70–99)
POTASSIUM: 4.5 meq/L (ref 3.5–5.1)
SODIUM: 133 meq/L — AB (ref 135–145)
TOTAL PROTEIN: 6.8 g/dL (ref 6.0–8.3)
Total Bilirubin: 0.3 mg/dL (ref 0.2–1.2)

## 2017-09-17 LAB — HEMOGLOBIN A1C: HEMOGLOBIN A1C: 8.7 % — AB (ref 4.6–6.5)

## 2017-09-18 ENCOUNTER — Telehealth: Payer: Self-pay | Admitting: Endocrinology

## 2017-09-18 NOTE — Telephone Encounter (Signed)
Notified pt discuss the lab results on the office visit per Dr. Sharol Given... Pt wanted Dr. Dwyane Dee to call him back regarding about the results.

## 2017-09-18 NOTE — Telephone Encounter (Signed)
His blood sugar was 451.  Need to have him see Leonia Reader today to assess why his sugars are out of control

## 2017-09-18 NOTE — Telephone Encounter (Signed)
Pt called wanted to know about his lab results--please advise

## 2017-09-18 NOTE — Telephone Encounter (Signed)
Will discuss on office visit

## 2017-09-19 ENCOUNTER — Telehealth: Payer: Self-pay | Admitting: Nutrition

## 2017-09-19 NOTE — Telephone Encounter (Signed)
LV--calling regarding about blood sugar and call the office back [phone]

## 2017-09-19 NOTE — Telephone Encounter (Signed)
Message left on machine that I have the morning and at 1PM today open to see him.  Telephone number given to call me.

## 2017-09-19 NOTE — Telephone Encounter (Signed)
Patient called back saying that the high reading was due to diet.  Says he is taking 130 of tresiba daily and Victoza 1.2 daily.  Blood sugar was 145 today. Says blood sugars are not high.  When I told him that it was 66, he was very surprised, saying he works out for 1 hour every morning, and blood sugars are all "good".  Not testing daily.   Suggested he test ac and HS until his visit with Dr. Dwyane Dee on Monday.  He agreed to do this.

## 2017-09-23 NOTE — Progress Notes (Signed)
Patient ID: [name], male   DOB: [birth year], 42 y.o.   MRN: [id number]          Reason for Appointment: Follow-up for Type 2 Diabetes  Referring physician: Jannette Fogo   History of Present Illness:          Date of diagnosis of type 2 diabetes mellitus:  2001      His weight is about 400 pounds at the time of diagnosis He does not know what his initial blood sugars and circumstances were but he apparently was started on insulin initially He was not able to tolerate metformin because of diarrhea Subsequently has been on insulin only, level of control not available but his A1c was significantly high at 12.8 in 2015  Background history:    Recent history:   INSULIN regimen is:  130 units Tresiba, NovoLog 0 U tid     Non-insulin hypoglycemic drugs the patient is taking are: Victoza 1.2 mg daily  His A1c is 8.7, much higher than his last reading of 6.2   Current management, blood sugar patterns and problems identified:  He was started on Victoza on his last visit in July because of variable blood sugar control and postprandial hyperglycemia  He is tolerating this well without any nausea  He usually does not bring his monitor for download but today he has this available  Over the last 2 weeks he has checked his blood sugars only on 4 days and also sporadically before that  He does not check FASTING blood sugars  Most of his blood sugar monitoring is done after exercise around 8 AM, he may sometimes use the meter at the gym also  He has had a few readings over 400 which he thinks is from going off his diet completely periodically  Diet: He says that he is trying to generally keep his carbohydrate very small and mostly eating salads at suppertime  POSTPRANDIAL readings after lunch are not checked except once after a wrap from [place] which was 237  Previously after dinner blood sugar was 152  Only once had hypoglycemia on 9/3 probably from taking too much NovoLog for  correction of her 400+ reading  Blood sugars otherwise had her on 8-10 AM range from 126-198       Side effects from medications have been: Diarrhea with metformin, vomiting with Ozempic  Compliance with the medical regimen: Variable Hypoglycemia:   As above  Glucose monitoring:  done <1 a day         Glucometer:   ACCU-CHEK Blood sugar readings as above  Self-care: The diet that the patient has been following is: low carb meals, since March  2019   Typical meal intake: Breakfast is egg whites, some oatmeal/grits and banana.  Usually small portions of meat or other protein at lunch and dinner and only a small amount of sweet potato at lunch or carbohydrate              Dietician visit, most recent: Unknown               Exercise:  Weights, resistance and some cardio at the gym at least 5 days a week, does 2 sessions of 1 hour each  Weight history:  Wt Readings from Last 3 Encounters:  07/30/17 (!) 363 lb (164.7 kg)  06/08/17 (!) 392 lb (177.8 kg)  04/24/17 (!) 392 lb 9.6 oz (178.1 kg)    Glycemic control:   Lab Results  Component Value Date  HGBA1C 8.7 (H) 09/17/2017   HGBA1C 6.2 06/05/2017   HGBA1C 7.9 02/15/2017   Lab Results  Component Value Date   MICROALBUR 160.61 (H) 08/14/2013   LDLCALC 91 06/05/2017   CREATININE 2.29 (H) 09/17/2017   Lab Results  Component Value Date   MICRALBCREAT 818.2 (H) 08/14/2013    No results found for: FRUCTOSAMINE    Allergies as of 09/24/2017      Reactions   Reglan [metoclopramide] Other (See Comments)   Dysphoric reaction      Medication List        Accurate as of 09/24/17  2:09 PM. Always use your most recent med list.          accu-chek softclix lancets Use to check blood sugars 5 times a day. Dx code:E11.9. Insulin dependent.   allopurinol 100 MG tablet Commonly known as:  ZYLOPRIM TAKE 2 TABLETS BY MOUTH ONCE DAILY   Alpha-Lipoic Acid 200 MG Caps Take 1 capsule (200 mg total) by mouth daily.   amLODipine  5 MG tablet Commonly known as:  NORVASC TAKE 1 TABLET BY MOUTH ONCE DAILY   apixaban 5 MG Tabs tablet Commonly known as:  ELIQUIS Take 1 tablet (5 mg total) by mouth 2 (two) times daily.   ELIQUIS 5 MG Tabs tablet Generic drug:  apixaban TAKE 1 TABLET BY MOUTH TWICE DAILY   B-12 2500 MCG Subl Place 2,500 mcg under the tongue daily.   calcitRIOL 0.25 MCG capsule Commonly known as:  ROCALTROL TAKE 1 CAPSULE BY MOUTH ONCE DAILY   carvedilol 25 MG tablet Commonly known as:  COREG TAKE 1 TABLET BY MOUTH TWICE DAILY   cloNIDine 0.3 MG tablet Commonly known as:  CATAPRES Take 1 tablet (0.3 mg total) by mouth 2 (two) times daily.   FISH OIL PO Take 1 capsule by mouth at bedtime.   furosemide 40 MG tablet Commonly known as:  LASIX Take 2 tablets (80 mg total) by mouth 2 (two) times daily.   furosemide 40 MG tablet Commonly known as:  LASIX TAKE 2 TABLETS BY MOUTH TWICE DAILY   gabapentin 300 MG capsule Commonly known as:  NEURONTIN TAKE 1 CAPSULE BY MOUTH THREE TIMES DAILY   glucose blood test strip Use to check blood sugars 5 times a day. Dx code:E11.9. Insulin dependent.   hydrALAZINE 50 MG tablet Commonly known as:  APRESOLINE Take 50 mg by mouth 4 (four) times daily.   ibuprofen 200 MG tablet Commonly known as:  ADVIL,MOTRIN Take 200 mg by mouth every 6 (six) hours as needed for headache (pain).   insulin aspart 100 UNIT/ML FlexPen Commonly known as:  NOVOLOG Inject 25 Units into the skin 3 (three) times daily.   Insulin Degludec 200 UNIT/ML Sopn Inject 140 Units into the skin daily.   linagliptin 5 MG Tabs tablet Commonly known as:  TRADJENTA Take 1 tablet (5 mg total) by mouth daily.   loratadine 10 MG tablet Commonly known as:  CLARITIN Take 10 mg by mouth daily as needed (seasonal allergies).   lovastatin 20 MG tablet Commonly known as:  MEVACOR TAKE 1 TABLET BY MOUTH ONCE DAILY   metolazone 2.5 MG tablet Commonly known as:  ZAROXOLYN Take 2.5  mg by mouth every other day.   Pen Needles 3/16" 31G X 5 MM Misc Use five times daily   Insulin Pen Needle 31G X 5 MM Misc USE  4 TIMES DAILY   polyethylene glycol packet Commonly known as:  MIRALAX / GLYCOLAX Take 17  g by mouth daily as needed for mild constipation.   promethazine 12.5 MG tablet Commonly known as:  PHENERGAN Take 1 tablet (12.5 mg total) by mouth every 8 (eight) hours as needed for nausea or vomiting.   senna-docusate 8.6-50 MG tablet Commonly known as:  Senokot-S Take 2 tablets by mouth daily as needed for mild constipation.   VICTOZA 18 MG/3ML Sopn Generic drug:  liraglutide Inject 0.2 mLs (1.2 mg total) into the skin daily. Inject once daily at the same time   Zinc Gluconate 100 MG Tabs Take 2.2 tablets (220 mg total) by mouth daily.   zinc sulfate 220 (50 Zn) MG capsule Take 220 mg by mouth daily.       Allergies:  Allergies  Allergen Reactions  . Reglan [Metoclopramide] Other (See Comments)    Dysphoric reaction    Past Medical History:  Diagnosis Date  . Acute venous embolism and thrombosis of deep vessels of proximal lower extremity ([place]) 07/19/2011  . Anemia   . Chest pain, neg MI, normal coronaries by cath 02/18/2013  . CKD (chronic kidney disease) stage 3, GFR 30-59 ml/min ([facility]) 02/19/2013  . Colon polyps   . Diabetic ulcer of right foot ([place])   . DVT (deep venous thrombosis) ([place]) 09/2002   patient reports additional DVTs in '06 & '11 (unconfirmed)  . GERD (gastroesophageal reflux disease)   . History of blood transfusion    "related to OR" (10/31/2016)  . Hyperlipidemia 02/19/2013  . Hypertension   . Nausea & vomiting    "constant for the last couple weeks" (10/31/2016)  . Nephrotic syndrome   . Obesity    BMI 44, weight 346 pounds 01/30/14  . Pulmonary embolism ([place]) 09/2002   treated with 6 months of warfarin  . Type I diabetes mellitus ([place]) dx'd 2001    Past Surgical History:  Procedure Laterality Date  . AMPUTATION Right  12/22/2013   Procedure: AMPUTATION BELOW KNEE;  Surgeon: [name], MD;  Location: [facility];  Service: Orthopedics;  Laterality: Right;  . AMPUTATION Right 02/19/2014   Procedure: RIGHT ABOVE KNEE AMPUTATION ;  Surgeon: [name], MD;  Location: [facility];  Service: Orthopedics;  Laterality: Right;  . CARDIAC CATHETERIZATION  02/18/2013   normal coronaries  . ESOPHAGOGASTRODUODENOSCOPY N/A 11/02/2016   Procedure: ESOPHAGOGASTRODUODENOSCOPY (EGD);  Surgeon: [name], MD;  Location: [facility] ENDOSCOPY;  Service: Endoscopy;  Laterality: N/A;  . I&D EXTREMITY Right 12/14/2013   Procedure: IRRIGATION AND DEBRIDEMENT RIGHT FOOT;  Surgeon: [name], MD;  Location: [facility];  Service: Orthopedics;  Laterality: Right;  . INCISION AND DRAINAGE ABSCESS  2007; 2015   "back"  . INCISION AND DRAINAGE OF WOUND Right 12/22/2013   Procedure: I&D RIGHT BUTTOCK;  Surgeon: [name], MD;  Location: [facility];  Service: Orthopedics;  Laterality: Right;  . LEFT HEART CATHETERIZATION WITH CORONARY ANGIOGRAM N/A 02/18/2013   Procedure: LEFT HEART CATHETERIZATION WITH CORONARY ANGIOGRAM;  Surgeon: [name], MD;  Location: [name] C[facility] LAB;  Service: Cardiovascular;  Laterality: N/A;    Family History  Problem Relation Age of Onset  . Heart disease Mother   . Diabetes Mother   . Diabetes Maternal Uncle   . Diabetes Cousin     Social History:  reports that he has never smoked. He has never used smokeless tobacco. He reports that he drinks alcohol. He reports that he does not use drugs.   Review of Systems    LOW TESTOSTERONE Previously the rehab facility he  was in told him that he had a low testosterone and was started on testosterone injections He was taking 50 mg every 2 weeks With this he thinks he felt more energetic However he stopped taking the injection because of fear of getting blood clots which he has had several times.   Reportedly he has had normal prolactin levels and LH but no results are  available  Free testosterone level is subsequently normal  Lipid history: On Mevacor with good control as follows:    Lab Results  Component Value Date   CHOL 150 06/05/2017   HDL 33.30 (L) 06/05/2017   LDLCALC 91 06/05/2017   TRIG 127.0 06/05/2017   CHOLHDL 4 06/05/2017           Hypertension: He is taking clonidine, Norvasc and Coreg followed by nephrologist  BP Readings from Last 3 Encounters:  09/24/17 (!) 141/80  08/14/17 (!) 105/50  08/02/17 (!) 142/80   He is concerned about sleep apnea, has not discussed with PCP  Most recent eye exam was in 2018  Most recent foot exam: 4/69   Complications of diabetes: Nephropathy with renal insufficiency and proteinuria, unknown status of retinopathy, erectile dysfunction  LABS:  No visits with results within 1 Week(s) from this visit.  Latest known visit with results is:  Lab on 09/17/2017  Component Date Value Ref Range Status  . Sodium 09/17/2017 133* 135 - 145 mEq/L Final  . Potassium 09/17/2017 4.5  3.5 - 5.1 mEq/L Final  . Chloride 09/17/2017 102  96 - 112 mEq/L Final  . CO2 09/17/2017 26  19 - 32 mEq/L Final  . Glucose, Bld 09/17/2017 451* 70 - 99 mg/dL Final  . BUN 09/17/2017 55* 6 - 23 mg/dL Final  . Creatinine, Ser 09/17/2017 2.29* 0.40 - 1.50 mg/dL Final  . Total Bilirubin 09/17/2017 0.3  0.2 - 1.2 mg/dL Final  . Alkaline Phosphatase 09/17/2017 119* 39 - 117 U/L Final  . AST 09/17/2017 17  0 - 37 U/L Final  . ALT 09/17/2017 24  0 - 53 U/L Final  . Total Protein 09/17/2017 6.8  6.0 - 8.3 g/dL Final  . Albumin 09/17/2017 3.4* 3.5 - 5.2 g/dL Final  . Calcium 09/17/2017 8.8  8.4 - 10.5 mg/dL Final  . GFR 09/17/2017 40.74* >60.00 mL/min Final  . Hgb A1c MFr Bld 09/17/2017 8.7* 4.6 - 6.5 % Final   Glycemic Control Guidelines for People with Diabetes:Non Diabetic:  <6%Goal of Therapy: <7%Additional Action Suggested:  >8%     Physical Examination:  BP (!) 141/80   Pulse 90   Ht 6\' 2"  (1.88 m)   SpO2 98%    BMI 46.61 kg/m       ASSESSMENT:  Diabetes type 2, with morbid obesity  See history of present illness for detailed discussion of current diabetes management, blood sugar patterns and problems identified  A1c is much higher at 8.7  Although some of his readings are clearly related to poor diet he is not having consistent hyperglycemia, has had readings over 400 at times Occasionally does have high readings after breakfast and exercise in the morning Without taking NovoLog he may still have relatively high readings if he is eating any significant amount of carbohydrate  He does much better when he is eating low-carb meals and may still be benefiting from restarting Victoza Unable to establish a pattern for his fasting blood sugars which are not being monitored, these are early morning  CKD: Followed by a nephrologist and  stable  HYPERTENSION: Appears better controlled now  PLAN:    He will need to take NovoLog when he is eating any significant amount of carbohydrate at meals.  He can start with 20 units of NovoLog with average meals that have carbohydrate but 25 for larger meals  To take NovoLog right before eating  Discussed importance of checking blood sugar at least once a day at various times  He will need to do readings in the mornings at least 3 times a week on waking up  He will need to review his blood sugars about 2 hours after eating but the nurse educator in a couple of weeks to see how he is doing  Continue Victoza 1.8 mg, may consider increasing this on the next visit  No change in TRESIBA unless fasting readings are consistently out of range that was discussed  Continue balanced meals with low carbohydrates and some protein at each meal  Overall needs to cut back on sweets and binge eating which occasionally cause his blood sugar to be over 400  He will discuss studies for sleep apnea with PCP  Influenza vaccine given  Patient Instructions  Check blood  sugars on waking up  3/7 days  Also check blood sugars about 2 hours after a meal and do this after different meals by rotation  Recommended blood sugar levels on waking up is 90-130 and about 2 hours after meal is 130-160  Please bring your blood sugar monitor to each visit, thank you  20 novolog with carb meals  Tresiba dose decided on am sugar    Counseling time on subjects discussed in assessment and plan sections is over 50% of today's 25 minute visit   [name] 09/24/2017, 2:09 PM   Note: This office note was prepared with Dragon voice recognition system technology. Any transcriptional errors that result from this process are unintentional.

## 2017-09-24 ENCOUNTER — Encounter: Payer: Self-pay | Admitting: Endocrinology

## 2017-09-24 ENCOUNTER — Ambulatory Visit (INDEPENDENT_AMBULATORY_CARE_PROVIDER_SITE_OTHER): Payer: Medicare Other | Admitting: Endocrinology

## 2017-09-24 VITALS — BP 141/80 | HR 90 | Ht 74.0 in

## 2017-09-24 DIAGNOSIS — E1165 Type 2 diabetes mellitus with hyperglycemia: Secondary | ICD-10-CM

## 2017-09-24 DIAGNOSIS — Z23 Encounter for immunization: Secondary | ICD-10-CM

## 2017-09-24 DIAGNOSIS — Z794 Long term (current) use of insulin: Secondary | ICD-10-CM | POA: Diagnosis not present

## 2017-09-24 NOTE — Patient Instructions (Addendum)
Check blood sugars on waking up  3/7 days  Also check blood sugars about 2 hours after a meal and do this after different meals by rotation  Recommended blood sugar levels on waking up is 90-130 and about 2 hours after meal is 130-160  Please bring your blood sugar monitor to each visit, thank you  20 novolog with carb meals  Tresiba dose decided on am sugar

## 2017-09-26 ENCOUNTER — Encounter: Payer: Self-pay | Admitting: Internal Medicine

## 2017-09-26 ENCOUNTER — Other Ambulatory Visit: Payer: Self-pay | Admitting: Internal Medicine

## 2017-09-26 NOTE — Telephone Encounter (Signed)
Opened in error

## 2017-09-27 ENCOUNTER — Telehealth: Payer: Self-pay | Admitting: Internal Medicine

## 2017-09-27 NOTE — Telephone Encounter (Signed)
Next appt scheduled 03/14/18 with PCP.

## 2017-09-27 NOTE — Telephone Encounter (Signed)
Pt called in reference to his Polysomnography order being placed for an appt.  Called the [facility] and spoke with Coralyn Mark.  Order site chosen was for [facility].  Please re-enter this order for the [facility] and they will call the patient and get him scheduled since the [facility] site never sch the patient.

## 2017-09-28 ENCOUNTER — Other Ambulatory Visit: Payer: Self-pay | Admitting: Internal Medicine

## 2017-09-28 DIAGNOSIS — N529 Male erectile dysfunction, unspecified: Secondary | ICD-10-CM

## 2017-09-28 DIAGNOSIS — N3944 Nocturnal enuresis: Secondary | ICD-10-CM

## 2017-09-28 NOTE — Telephone Encounter (Signed)
Thanks, order placed

## 2017-10-02 ENCOUNTER — Encounter: Payer: Medicare Other | Admitting: Physical Therapy

## 2017-10-03 ENCOUNTER — Other Ambulatory Visit: Payer: Self-pay

## 2017-10-03 ENCOUNTER — Ambulatory Visit: Payer: Medicare Other | Admitting: Nutrition

## 2017-10-03 ENCOUNTER — Encounter: Payer: Self-pay | Admitting: Physical Therapy

## 2017-10-03 ENCOUNTER — Ambulatory Visit: Payer: Medicare Other | Admitting: Physical Therapy

## 2017-10-03 ENCOUNTER — Ambulatory Visit: Payer: Medicare Other | Attending: Internal Medicine | Admitting: Physical Therapy

## 2017-10-03 DIAGNOSIS — R2689 Other abnormalities of gait and mobility: Secondary | ICD-10-CM | POA: Diagnosis not present

## 2017-10-03 DIAGNOSIS — M6281 Muscle weakness (generalized): Secondary | ICD-10-CM | POA: Diagnosis not present

## 2017-10-03 DIAGNOSIS — E669 Obesity, unspecified: Secondary | ICD-10-CM | POA: Diagnosis not present

## 2017-10-03 DIAGNOSIS — R293 Abnormal posture: Secondary | ICD-10-CM | POA: Diagnosis not present

## 2017-10-03 DIAGNOSIS — Z9181 History of falling: Secondary | ICD-10-CM

## 2017-10-03 DIAGNOSIS — R2681 Unsteadiness on feet: Secondary | ICD-10-CM

## 2017-10-03 DIAGNOSIS — M25651 Stiffness of right hip, not elsewhere classified: Secondary | ICD-10-CM | POA: Insufficient documentation

## 2017-10-08 NOTE — Therapy (Signed)
[facility] [address] [place], [place], [address] Phone: [phone]   Fax:  [phone]  Physical Therapy Evaluation  Patient Details  Name: [name] MRN: [id number] Date of Birth: 1975-06-11 Referring Provider (PT): [name], [place]   Encounter Date: 10/03/2017     10/03/17 1200  PT Visits / Re-Eval  Visit Number 1  Number of Visits 25  Date for PT Re-Evaluation 01/01/18  Authorization  Authorization Type Medicare & Medicaid  PT Time Calculation  PT Start Time 1015  PT Stop Time 1100  PT Time Calculation (min) 45 min  PT - End of Session  Equipment Utilized During Treatment Gait belt  Activity Tolerance Patient tolerated treatment well  Behavior During Therapy [facility] for tasks assessed/performed    Past Medical History:  Diagnosis Date  . Acute venous embolism and thrombosis of deep vessels of proximal lower extremity ([place]) 07/19/2011  . Anemia   . Chest pain, neg MI, normal coronaries by cath 02/18/2013  . CKD (chronic kidney disease) stage 3, GFR 30-59 ml/min (HCC) 02/19/2013  . Colon polyps   . Diabetic ulcer of right foot ([place])   . DVT (deep venous thrombosis) ([place]) 09/2002   patient reports additional DVTs in '06 & '11 (unconfirmed)  . GERD (gastroesophageal reflux disease)   . History of blood transfusion    "related to OR" (10/31/2016)  . Hyperlipidemia 02/19/2013  . Hypertension   . Nausea & vomiting    "constant for the last couple weeks" (10/31/2016)  . Nephrotic syndrome   . Obesity    BMI 44, weight 346 pounds 01/30/14  . Pulmonary embolism ([place]) 09/2002   treated with 6 months of warfarin  . Type I diabetes mellitus ([place]) dx'd 2001    Past Surgical History:  Procedure Laterality Date  . AMPUTATION Right 12/22/2013   Procedure: AMPUTATION BELOW KNEE;  Surgeon: [name], MD;  Location: [facility];  Service: Orthopedics;  Laterality: Right;  . AMPUTATION Right 02/19/2014    Procedure: RIGHT ABOVE KNEE AMPUTATION ;  Surgeon: [name], MD;  Location: [facility];  Service: Orthopedics;  Laterality: Right;  . CARDIAC CATHETERIZATION  02/18/2013   normal coronaries  . ESOPHAGOGASTRODUODENOSCOPY N/A 11/02/2016   Procedure: ESOPHAGOGASTRODUODENOSCOPY (EGD);  Surgeon: [name], MD;  Location: [facility] ENDOSCOPY;  Service: Endoscopy;  Laterality: N/A;  . I&D EXTREMITY Right 12/14/2013   Procedure: IRRIGATION AND DEBRIDEMENT RIGHT FOOT;  Surgeon: [name], MD;  Location: [facility];  Service: Orthopedics;  Laterality: Right;  . INCISION AND DRAINAGE ABSCESS  2007; 2015   "back"  . INCISION AND DRAINAGE OF WOUND Right 12/22/2013   Procedure: I&D RIGHT BUTTOCK;  Surgeon: [name], MD;  Location: [facility];  Service: Orthopedics;  Laterality: Right;  . LEFT HEART CATHETERIZATION WITH CORONARY ANGIOGRAM N/A 02/18/2013   Procedure: LEFT HEART CATHETERIZATION WITH CORONARY ANGIOGRAM;  Surgeon: [name], MD;  Location: [facility] C[facility] LAB;  Service: Cardiovascular;  Laterality: N/A;    There were no vitals filed for this visit.   10/03/17 1022  Symptoms/Limitations  Subjective This 42yo male was referred on 09/12/2017 by [name], DO for PT evaluation with his Transfemoral Amputation. He underwent right Transfemoral 02/19/2014. PT for prosthetic training thru 03/15/2017.  He is still working out 5 days/wk working on [employer] added endurance. He got new socket for prosthesis August 2019. He returns to PT to improve standing tolerance, endurance and walk more.   Pertinent History R TFA,  DM1, morbid obesity, CKD3, HTN, DVT/PE,   Limitations Lifting;Standing;Walking;House hold activities  Patient Stated Goals to walk better with prosthesis, build standing tolerance & endurance  Pain Assessment  Currently in Pain? No/denies    WEIGHT  Body weight without prosthesis or liner = 389.2# Prosthesis & liner = 17.2#    10/03/17 1015  Assessment  Medical Diagnosis Right  Transfemoral Amputation  Referring Provider (PT) [name], DO  Onset Date/Surgical Date 09/12/17 (MD referral to PT)  Hand Dominance Right  Prior Therapy OPPT thru 03/15/17  Precautions  Precautions Fall  Balance Screen  Has the patient fallen in the past 6 months No  Has the patient had a decrease in activity level because of a fear of falling?  No  Is the patient reluctant to leave their home because of a fear of falling?  No  Home Environment  Living Environment Private residence  Living Arrangements Alone  Type of [place] - 2 wheels;Tub bench;Wheelchair - manual;Grab bars - toilet  Prior Function  Level of Independence Independent;Independent with household mobility without device;Independent with community mobility without device (prior to amputation no limitations)  Vocation On disability  Leisure wieght lifting, fishing  Posture/Postural Control  Posture/Postural Control Postural limitations  Postural Limitations Rounded Shoulders;Forward head;Increased lumbar lordosis;Flexed trunk;Weight shift left  ROM / Strength  AROM / PROM / Strength AROM;Strength  AROM  Overall AROM  Deficits;Within functional limits for tasks performed  Overall AROM Comments hip extension -20*  Strength  Overall Strength Deficits;Within functional limits for tasks performed  Overall Strength Comments right hip grossly 4/5  Transfers  Transfers Sit to Stand;Stand to Sit  Sit to Stand 4: Min guard;5: Supervision;With upper extremity assist;With armrests;From chair/3-in-1 (requires RW to stabilize & PT held RW to stabilize it)  Stand to Sit 5: Supervision;With upper extremity assist;With armrests;To chair/3-in-1 (requires RW to stabilize & PT held RW to stabilize it)  Ambulation/Gait  Ambulation/Gait Yes  Ambulation/Gait Assistance 5: Supervision  Ambulation/Gait Assistance Details excessive UE weight  bearing, terminal impact of prosthetic knee in terminal swing & forceful impact with ground   Ambulation Distance (Feet) 190 Feet  Assistive device Rolling walker;Prosthesis  Gait Pattern Step-through pattern;Decreased step length - left;Decreased stance time - right;Decreased stride length;Right hip hike;Antalgic;Lateral hip instability;Trunk flexed;Wide base of support  Ambulation Surface Indoor;Level  Gait velocity 0.44 ft/sec  6 Minute Walk- Baseline  6 Minute Walk- Baseline y  BP (mmHg) 180/79  HR (bpm) 96  02 Sat (%RA) 98 %  6 Minute walk- Post Test  6 Minute Walk Post Test y  BP (mmHg) 185/74  HR (bpm) 118  02 Sat (%RA) 96 %  Modified Borg Scale for Dyspnea 4- somewhat severe  Perceived Rate of Exertion (Borg) 13- Somewhat hard  6 minute walk test results   Aerobic Endurance Distance Walked 190  Endurance additional comments Pt takes 5 minutes or more to "mentally prepare" for tasks  Standardized Balance Assessment  Standardized Balance Assessment Berg Balance Test  Berg Balance Test  Sit to Stand 0 (with RW support = 1)  Standing Unsupported 1 (with RW support = 4)  Sitting with Back Unsupported but Feet Supported on Floor or Stool 4  Stand to Sit 0 (with RW support = 1)  Transfers 3  Standing Unsupported with Eyes Closed 0 (with RW support = 4)  Standing Ubsupported with Feet Together 0 (with RW support = 4)  From Standing, Reach Forward with Outstretched Arm 0 (with RW support = 4)  From Standing Position, Pick up Object from Floor 0 (with RW support = 4)  From Standing Position, Turn to Look Behind Over each Shoulder 0 (with RW support = 2)  Turn 360 Degrees 0 (with RW support = 1)  Standing Unsupported, Alternately Place Feet on Step/Stool 0 (with RW support = 2)  Standing Unsupported, One Foot in Front 0 (with RW support = 3)  Standing on One Leg 0 (with RW support = 4)  Total Score 8  Berg comment: Berg Tasks with RW support = 41/56    10/03/17 [place] with Skin check;Residual limb care;Prosthetic cleaning;Ply sock cleaning;Correct ply sock adjustment;Proper wear schedule/adjustment;Proper weight-bearing schedule/adjustment  Donning prosthesis  6  Doffing prosthesis  6  Current prosthetic wear tolerance (days/week)  daily  Current prosthetic wear tolerance (#hours/day)  reports 8-10 hours/day  Current prosthetic weight-bearing tolerance (hours/day)  Pt tolerated 6 minutes of gait without c/o limb or back pain  Edema Non-pitting edema on residual limb. Pitting edema on LLE entire LE with TED compression knee high sock.   Residual limb condition  Large amount of loose skin & minimal muscle tone. No open areas. Incision healed with no adherances. Normal color, temperature and moisture level. Minimal hair growth. No adductor roll.                   Objective measurements completed on examination: See above findings.          10/03/17 1157  Plan  Clinical Impression Statement This 42yo male underwent a Right Transfemoral Amputation 02/19/2014. He was in a skilled nursing facility for extended period thru 2018. He received his first prosthesis while in nursing home with some small initial prosthetic training. He was seen by this PT for outpatient PT thru 03/15/2017. He received his new socket 09/24/2107. He returns for further PT prosthetic training to further improve mobility with gait & balance. His morbid obesity limits him a great deal. PT & patient have discussed Bariatric Surgery and Plastic surgery to residual limb to remove excess tissue & skin. He is not ready for either surgery but both would improve his ability to function with a prosthesis at Transfemoral level. If he decides to procede with both surgeries, this PT would recommend Bariatric surgery first and after he has lost more weight, then have the plastic surgery as weight loss would cause additional loose tissue/skin on limb.  PT  weighed patient in our clinic and he currently weighs 389.2# not counting weight of prosthesis. His gait is dependent on excessive BUE weight bearing and significant gait deviations limiting his mobility. 6-Minute Walk Test with RW & prosthesis was only 190' with no stops for resting and HR increased from 96bpm to 118bpm. His BP was 180/79 at rest and 185/74 after gait. His gait velocity was 0.44 ft/sec indicating household ambulator with high fall risk. His Berg Balance Test is 8/56 and with tasks of Berg performed with walker support 41/56; both indicate high fall risk and dependency with standing ADLs. Patient would benefit from skilled PT to progress his function with prosthesis further but will be limited by his weight & limb structure.  History and Personal Factors relevant to plan of care: R TFA, DM1, morbid obesity, CKD3, HTN, DVT/PE,  Clinical Presentation Stable  Clinical Decision Making Low  Pt will benefit from skilled therapeutic intervention in order to improve on the  following deficits Abnormal gait;Decreased activity tolerance;Decreased balance;Decreased endurance;Decreased knowledge of precautions;Decreased knowledge of use of DME;Decreased mobility;Decreased range of motion;Decreased strength;Dizziness;Impaired flexibility;Postural dysfunction;Prosthetic Dependency;Obesity  Rehab Potential Good  PT Frequency 2x / week  PT Duration 12 weeks  PT Treatment/Interventions ADLs/Self Care Home Management;Canalith Repostioning;DME Instruction;Gait training;Stair training;Functional mobility training;Therapeutic activities;Therapeutic exercise;Balance training;Neuromuscular re-education;Patient/family education;Prosthetic Training;Manual techniques;Vestibular  PT Next Visit Plan Instruct in exercises to strength residual limb/hip muscles, standing balance & strength, endurance exercises  Consulted and Agree with Plan of Care Patient     PT Short Term Goals - 10/03/17 1204      PT SHORT TERM  GOAL #1   Title  Patient verbalizes & demonstrates understanding of updated HEP. (All STGs Target Date: 11/02/2017)    Time  1    Period  Months    Status  New    Target Date  11/02/17      PT SHORT TERM GOAL #2   Title  Patient tolerates standing activities for 7 minutes with RW support.     Time  1    Period  Months    Status  New    Target Date  11/02/17      PT SHORT TERM GOAL #3   Title  6-Minute Walk Test with RW & prosthesis >210'     Time  1    Period  Months    Status  New    Target Date  11/02/17      PT Long Term Goals - 10/03/17 1200      PT LONG TERM GOAL #1   Title  Patient demonstrates & verbalizes understanding of ongoing fitness plan. (All LTGs Target Date: 12/28/2017)    Time  3    Period  Months    Status  New    Target Date  12/28/17      PT LONG TERM GOAL #2   Title  Patient tolerates standing for 10 minutes with intermittent UE support performing standing ADLs with RW.     Time  3    Period  Months    Status  New    Target Date  12/28/17      PT LONG TERM GOAL #3   Title  Berg Balance tasks with walker support >/= 50/56.     Time  3    Period  Months    Status  New    Target Date  12/28/17      PT LONG TERM GOAL #4   Title  6-Minute Walk Test with rolling walker & prosthesis >250'.     Time  3    Period  Months    Status  New    Target Date  12/28/17      PT LONG TERM GOAL #5   Title  Patient negotiates ramp, curb with RW & stairs with 2 rails with transfemoral prosthesis modified independent to enable community access.      Time  3    Period  Months    Status  New    Target Date  12/28/17                           Patient will benefit from skilled therapeutic intervention in order to improve the following deficits and impairments:     Visit Diagnosis: Muscle weakness (generalized)  Other abnormalities of gait and mobility  Abnormal posture  Unsteadiness on feet  Stiffness of right hip, not elsewhere  classified  History of falling  Obesity with body mass index greater than 30     Problem List Patient Active Problem List   Diagnosis Date Noted  . Healthcare maintenance 08/15/2017  . Nausea and vomiting 06/08/2017  . Erectile dysfunction 04/22/2017  . Nocturnal enuresis 04/19/2017  . Blood in stool 03/07/2017  . History of nausea and vomiting 10/31/2016  . Prosthesis adjustment 08/17/2016  . History of pulmonary embolus (PE) 06/09/2016  . Normocytic anemia 01/30/2014  . Constipation   . Above knee amputation of right lower extremity ([place]) 12/26/2013  . GERD (gastroesophageal reflux disease) 08/14/2013  . Hyperlipidemia 02/19/2013  . S/P cardiac cath, 02/18/13, normal coronaries 02/19/2013  . CKD (chronic kidney disease) stage 3, GFR 30-59 ml/min (HCC) 02/19/2013  . Nephrotic syndrome 02/12/2012  . Hypertension 07/24/2011  . Long term current use of anticoagulant therapy 07/19/2011  . History of DVT (deep vein thrombosis) 07/28/2009  . PROTEINURIA, MILD 02/01/2007  . Morbid obesity ([place]) 10/26/2005  . Type II diabetes mellitus ([place]) 01/09/2002    Tyrian Peart PT, DPT 10/08/2017, 6:57 AM  [facility] [address] [place], [place], [address] Phone: [phone]   Fax:  [phone]  Name: [name] MRN: [id number] Date of Birth: [birth year]

## 2017-10-09 ENCOUNTER — Ambulatory Visit: Payer: Medicare Other | Attending: Internal Medicine

## 2017-10-09 DIAGNOSIS — M6281 Muscle weakness (generalized): Secondary | ICD-10-CM | POA: Insufficient documentation

## 2017-10-09 DIAGNOSIS — E669 Obesity, unspecified: Secondary | ICD-10-CM | POA: Insufficient documentation

## 2017-10-09 DIAGNOSIS — R2689 Other abnormalities of gait and mobility: Secondary | ICD-10-CM | POA: Insufficient documentation

## 2017-10-09 DIAGNOSIS — R2681 Unsteadiness on feet: Secondary | ICD-10-CM | POA: Insufficient documentation

## 2017-10-09 DIAGNOSIS — M25651 Stiffness of right hip, not elsewhere classified: Secondary | ICD-10-CM | POA: Insufficient documentation

## 2017-10-09 DIAGNOSIS — Z9181 History of falling: Secondary | ICD-10-CM | POA: Insufficient documentation

## 2017-10-09 DIAGNOSIS — R293 Abnormal posture: Secondary | ICD-10-CM | POA: Insufficient documentation

## 2017-10-16 ENCOUNTER — Ambulatory Visit: Payer: Medicare Other | Admitting: Physical Therapy

## 2017-10-20 ENCOUNTER — Other Ambulatory Visit: Payer: Self-pay | Admitting: Student in an Organized Health Care Education/Training Program

## 2017-10-20 ENCOUNTER — Other Ambulatory Visit: Payer: Self-pay | Admitting: Internal Medicine

## 2017-10-23 ENCOUNTER — Ambulatory Visit: Payer: Medicare Other

## 2017-10-23 DIAGNOSIS — Z9181 History of falling: Secondary | ICD-10-CM

## 2017-10-23 DIAGNOSIS — R2681 Unsteadiness on feet: Secondary | ICD-10-CM | POA: Diagnosis not present

## 2017-10-23 DIAGNOSIS — R2689 Other abnormalities of gait and mobility: Secondary | ICD-10-CM | POA: Diagnosis not present

## 2017-10-23 DIAGNOSIS — M6281 Muscle weakness (generalized): Secondary | ICD-10-CM

## 2017-10-23 DIAGNOSIS — E669 Obesity, unspecified: Secondary | ICD-10-CM

## 2017-10-23 DIAGNOSIS — R293 Abnormal posture: Secondary | ICD-10-CM | POA: Diagnosis not present

## 2017-10-23 DIAGNOSIS — M25651 Stiffness of right hip, not elsewhere classified: Secondary | ICD-10-CM | POA: Diagnosis not present

## 2017-10-23 NOTE — Therapy (Signed)
[facility] [address] [place], [place], [address] Phone: [phone]   Fax:  [phone]  Physical Therapy Treatment  Patient Details  Name: [name] MRN: [id number] Date of Birth: 10-May-1975 Referring Provider (PT): [name], [place]   Encounter Date: 10/23/2017  PT End of Session - 10/23/17 0850    Visit Number  2    Number of Visits  25    Date for PT Re-Evaluation  01/01/18    Authorization Type  Medicare & Medicaid    PT Start Time  7989    PT Stop Time  0930    PT Time Calculation (min)  43 min    Equipment Utilized During Treatment  Gait belt    Activity Tolerance  Patient tolerated treatment well    Behavior During Therapy  [facility] for tasks assessed/performed       Past Medical History:  Diagnosis Date  . Acute venous embolism and thrombosis of deep vessels of proximal lower extremity ([place]) 07/19/2011  . Anemia   . Chest pain, neg MI, normal coronaries by cath 02/18/2013  . CKD (chronic kidney disease) stage 3, GFR 30-59 ml/min (HCC) 02/19/2013  . Colon polyps   . Diabetic ulcer of right foot ([place])   . DVT (deep venous thrombosis) ([place]) 09/2002   patient reports additional DVTs in '06 & '11 (unconfirmed)  . GERD (gastroesophageal reflux disease)   . History of blood transfusion    "related to OR" (10/31/2016)  . Hyperlipidemia 02/19/2013  . Hypertension   . Nausea & vomiting    "constant for the last couple weeks" (10/31/2016)  . Nephrotic syndrome   . Obesity    BMI 44, weight 346 pounds 01/30/14  . Pulmonary embolism ([place]) 09/2002   treated with 6 months of warfarin  . Type I diabetes mellitus ([place]) dx'd 2001    Past Surgical History:  Procedure Laterality Date  . AMPUTATION Right 12/22/2013   Procedure: AMPUTATION BELOW KNEE;  Surgeon: [name], MD;  Location: [facility];  Service: Orthopedics;  Laterality: Right;  . AMPUTATION Right 02/19/2014   Procedure: RIGHT ABOVE KNEE  AMPUTATION ;  Surgeon: [name], MD;  Location: [facility];  Service: Orthopedics;  Laterality: Right;  . CARDIAC CATHETERIZATION  02/18/2013   normal coronaries  . ESOPHAGOGASTRODUODENOSCOPY N/A 11/02/2016   Procedure: ESOPHAGOGASTRODUODENOSCOPY (EGD);  Surgeon: [name], MD;  Location: [facility] ENDOSCOPY;  Service: Endoscopy;  Laterality: N/A;  . I&D EXTREMITY Right 12/14/2013   Procedure: IRRIGATION AND DEBRIDEMENT RIGHT FOOT;  Surgeon: [name], MD;  Location: [facility];  Service: Orthopedics;  Laterality: Right;  . INCISION AND DRAINAGE ABSCESS  2007; 2015   "back"  . INCISION AND DRAINAGE OF WOUND Right 12/22/2013   Procedure: I&D RIGHT BUTTOCK;  Surgeon: [name], MD;  Location: [facility];  Service: Orthopedics;  Laterality: Right;  . LEFT HEART CATHETERIZATION WITH CORONARY ANGIOGRAM N/A 02/18/2013   Procedure: LEFT HEART CATHETERIZATION WITH CORONARY ANGIOGRAM;  Surgeon: [name], MD;  Location: [facility] CATH LAB;  Service: Cardiovascular;  Laterality: N/A;    There were no vitals filed for this visit.  Subjective Assessment - 10/23/17 0851    Subjective  No falls to report. Pt states he has been attending the gym 5days/week and uses the treadmill and stairmaster using UE to pull up due to poor balance.     Pertinent History  R TFA, DM1, morbid obesity, CKD3, HTN, DVT/PE,     Limitations  Lifting;Standing;Walking;House hold activities    Currently in Pain?  No/denies    Multiple Pain Sites  No        [facility] Adult PT Treatment/Exercise - 10/23/17 7846      Ambulation/Gait   Ambulation/Gait  Yes    Ambulation/Gait Assistance  4: Min guard;5: Supervision    Ambulation/Gait Assistance Details  VC's for decreased UE support on RW, upright posture and pacing.     Ambulation Distance (Feet)  115 Feet    Assistive device  Rolling walker;Prosthesis    Gait Pattern  Step-through pattern;Decreased step length - left;Decreased stance time - right;Decreased stride length;Right hip  hike;Antalgic;Lateral hip instability;Trunk flexed;Wide base of support    Ambulation Surface  Level;Indoor      Exercises   Exercises  Knee/Hip      Knee/Hip Exercises: Supine   Other Supine Knee/Hip Exercises  4 way hip bridge on mat with boulster underneath RLE to increase RLE strength, 10reps/2sets in each direction, VC's for initial technique, and proper breathing throughout.         PT Short Term Goals - 10/03/17 1204      PT SHORT TERM GOAL #1   Title  Patient verbalizes & demonstrates understanding of updated HEP. (All STGs Target Date: 11/02/2017)    Time  1    Period  Months    Status  New    Target Date  11/02/17      PT SHORT TERM GOAL #2   Title  Patient tolerates standing activities for 7 minutes with RW support.     Time  1    Period  Months    Status  New    Target Date  11/02/17      PT SHORT TERM GOAL #3   Title  6-Minute Walk Test with RW & prosthesis >210'     Time  1    Period  Months    Status  New    Target Date  11/02/17        PT Long Term Goals - 10/03/17 1200      PT LONG TERM GOAL #1   Title  Patient demonstrates & verbalizes understanding of ongoing fitness plan. (All LTGs Target Date: 12/28/2017)    Time  3    Period  Months    Status  New    Target Date  12/28/17      PT LONG TERM GOAL #2   Title  Patient tolerates standing for 10 minutes with intermittent UE support performing standing ADLs with RW.     Time  3    Period  Months    Status  New    Target Date  12/28/17      PT LONG TERM GOAL #3   Title  Berg Balance tasks with walker support >/= 50/56.     Time  3    Period  Months    Status  New    Target Date  12/28/17      PT LONG TERM GOAL #4   Title  6-Minute Walk Test with rolling walker & prosthesis >250'.     Time  3    Period  Months    Status  New    Target Date  12/28/17      PT LONG TERM GOAL #5   Title  Patient negotiates ramp, curb with RW & stairs with 2 rails with transfemoral prosthesis modified  independent to enable community access.  Time  3    Period  Months    Status  New    Target Date  12/28/17        Plan - 10/23/17 1611    Clinical Impression Statement  Todays skilled session focused on gait training with RW and prosthesis with focus on decreasing UE weightbearing during ambulation and LE strengthening. Pt states he is going to the gym 5days/week and using treadmill/stairmaster. Pt should benefit from continued PT session to progress towards goals.     Rehab Potential  Good    PT Frequency  2x / week    PT Duration  12 weeks    PT Treatment/Interventions  ADLs/Self Care Home Management;Canalith Repostioning;DME Instruction;Gait training;Stair training;Functional mobility training;Therapeutic activities;Therapeutic exercise;Balance training;Neuromuscular re-education;Patient/family education;Prosthetic Training;Manual techniques;Vestibular    PT Next Visit Plan  Continue exercises to strength residual limb/hip muscles, standing balance & strength, endurance exercises    Consulted and Agree with Plan of Care  Patient       Patient will benefit from skilled therapeutic intervention in order to improve the following deficits and impairments:  Abnormal gait, Decreased activity tolerance, Decreased balance, Decreased endurance, Decreased knowledge of precautions, Decreased knowledge of use of DME, Decreased mobility, Decreased range of motion, Decreased strength, Dizziness, Impaired flexibility, Postural dysfunction, Prosthetic Dependency, Obesity  Visit Diagnosis: Muscle weakness (generalized)  Other abnormalities of gait and mobility  Abnormal posture  Unsteadiness on feet  Stiffness of right hip, not elsewhere classified  History of falling  Obesity with body mass index greater than 30     Problem List Patient Active Problem List   Diagnosis Date Noted  . Healthcare maintenance 08/15/2017  . Nausea and vomiting 06/08/2017  . Erectile dysfunction  04/22/2017  . Nocturnal enuresis 04/19/2017  . Blood in stool 03/07/2017  . History of nausea and vomiting 10/31/2016  . Prosthesis adjustment 08/17/2016  . History of pulmonary embolus (PE) 06/09/2016  . Normocytic anemia 01/30/2014  . Constipation   . Above knee amputation of right lower extremity ([place]) 12/26/2013  . GERD (gastroesophageal reflux disease) 08/14/2013  . Hyperlipidemia 02/19/2013  . S/P cardiac cath, 02/18/13, normal coronaries 02/19/2013  . CKD (chronic kidney disease) stage 3, GFR 30-59 ml/min (HCC) 02/19/2013  . Nephrotic syndrome 02/12/2012  . Hypertension 07/24/2011  . Long term current use of anticoagulant therapy 07/19/2011  . History of DVT (deep vein thrombosis) 07/28/2009  . PROTEINURIA, MILD 02/01/2007  . Morbid obesity ([place]) 10/26/2005  . Type II diabetes mellitus ([place]) 01/09/2002   Chassity Felts, PTA  Chassity A Felts 10/23/2017, 4:23 PM  [facility] [address] [place], [place], [address] Phone: [phone]   Fax:  [phone]  Name: Yohann Curl MRN: [id number] Date of Birth: [birth year]

## 2017-10-24 ENCOUNTER — Other Ambulatory Visit: Payer: Self-pay | Admitting: Internal Medicine

## 2017-10-24 NOTE — Telephone Encounter (Signed)
Please include dx code. Thanks

## 2017-10-25 ENCOUNTER — Ambulatory Visit: Payer: Medicare Other | Admitting: Physical Therapy

## 2017-10-25 ENCOUNTER — Encounter: Payer: Self-pay | Admitting: Physical Therapy

## 2017-10-25 DIAGNOSIS — R2681 Unsteadiness on feet: Secondary | ICD-10-CM | POA: Diagnosis not present

## 2017-10-25 DIAGNOSIS — M6281 Muscle weakness (generalized): Secondary | ICD-10-CM | POA: Diagnosis not present

## 2017-10-25 DIAGNOSIS — R293 Abnormal posture: Secondary | ICD-10-CM | POA: Diagnosis not present

## 2017-10-25 DIAGNOSIS — Z9181 History of falling: Secondary | ICD-10-CM | POA: Diagnosis not present

## 2017-10-25 DIAGNOSIS — R2689 Other abnormalities of gait and mobility: Secondary | ICD-10-CM

## 2017-10-25 DIAGNOSIS — M25651 Stiffness of right hip, not elsewhere classified: Secondary | ICD-10-CM | POA: Diagnosis not present

## 2017-10-25 MED ORDER — ACCU-CHEK SOFTCLIX LANCETS MISC: 3 refills | Status: DC

## 2017-10-25 NOTE — Telephone Encounter (Signed)
Unclear if linagliptin should be continued, most recent notes do not mention it. Will defer to PCP

## 2017-10-25 NOTE — Telephone Encounter (Signed)
Please re-send rx - I included dx code so insurance will pay.   Thanks

## 2017-10-25 NOTE — Addendum Note (Signed)
Addended by: Ebbie Latus on: 10/25/2017 04:32 PM   Modules accepted: Orders

## 2017-10-25 NOTE — Therapy (Signed)
[facility] [address] [place], [place], [address] Phone: [phone]   Fax:  [phone]  Physical Therapy Treatment  Patient Details  Name: [name] MRN: [id number] Date of Birth: 24-May-1975 Referring Provider (PT): [name], [place]   Encounter Date: 10/25/2017  PT End of Session - 10/25/17 0805    Visit Number  3    Number of Visits  25    Date for PT Re-Evaluation  01/01/18    Authorization Type  Medicare & Medicaid    PT Start Time  0803    PT Stop Time  3335    PT Time Calculation (min)  41 min    Equipment Utilized During Treatment  --    Activity Tolerance  Patient tolerated treatment well    Behavior During Therapy  [facility] for tasks assessed/performed       Past Medical History:  Diagnosis Date  . Acute venous embolism and thrombosis of deep vessels of proximal lower extremity ([place]) 07/19/2011  . Anemia   . Chest pain, neg MI, normal coronaries by cath 02/18/2013  . CKD (chronic kidney disease) stage 3, GFR 30-59 ml/min (HCC) 02/19/2013  . Colon polyps   . Diabetic ulcer of right foot ([place])   . DVT (deep venous thrombosis) ([place]) 09/2002   patient reports additional DVTs in '06 & '11 (unconfirmed)  . GERD (gastroesophageal reflux disease)   . History of blood transfusion    "related to OR" (10/31/2016)  . Hyperlipidemia 02/19/2013  . Hypertension   . Nausea & vomiting    "constant for the last couple weeks" (10/31/2016)  . Nephrotic syndrome   . Obesity    BMI 44, weight 346 pounds 01/30/14  . Pulmonary embolism ([place]) 09/2002   treated with 6 months of warfarin  . Type I diabetes mellitus ([place]) dx'd 2001    Past Surgical History:  Procedure Laterality Date  . AMPUTATION Right 12/22/2013   Procedure: AMPUTATION BELOW KNEE;  Surgeon: [name], MD;  Location: [place];  Service: Orthopedics;  Laterality: Right;  . AMPUTATION Right 02/19/2014   Procedure: RIGHT ABOVE KNEE AMPUTATION ;   Surgeon: [name], MD;  Location: [place];  Service: Orthopedics;  Laterality: Right;  . CARDIAC CATHETERIZATION  02/18/2013   normal coronaries  . ESOPHAGOGASTRODUODENOSCOPY N/A 11/02/2016   Procedure: ESOPHAGOGASTRODUODENOSCOPY (EGD);  Surgeon: [name], MD;  Location: [facility] ENDOSCOPY;  Service: Endoscopy;  Laterality: N/A;  . I&D EXTREMITY Right 12/14/2013   Procedure: IRRIGATION AND DEBRIDEMENT RIGHT FOOT;  Surgeon: [name], MD;  Location: [place];  Service: Orthopedics;  Laterality: Right;  . INCISION AND DRAINAGE ABSCESS  2007; 2015   "back"  . INCISION AND DRAINAGE OF WOUND Right 12/22/2013   Procedure: I&D RIGHT BUTTOCK;  Surgeon: [name], MD;  Location: [place];  Service: Orthopedics;  Laterality: Right;  . LEFT HEART CATHETERIZATION WITH CORONARY ANGIOGRAM N/A 02/18/2013   Procedure: LEFT HEART CATHETERIZATION WITH CORONARY ANGIOGRAM;  Surgeon: [name], MD;  Location: [facility] C[facility] LAB;  Service: Cardiovascular;  Laterality: N/A;    There were no vitals filed for this visit.  Subjective Assessment - 10/25/17 0805    Subjective  No new complaints. No falls to report. Tired as he just finished working out at [employer].     Pertinent History  R TFA, DM1, morbid obesity, CKD3, HTN, DVT/PE,     Limitations  Lifting;Standing;Walking;House hold activities    Patient Stated Goals  to walk  better with prosthesis, build standing tolerance & endurance    Currently in Pain?  No/denies           [facility] Adult PT Treatment/Exercise - 10/25/17 0806      Transfers   Transfers  Sit to Stand;Stand to Sit    Five time sit to stand comments   49.13 sec's with UE support on RW from mat table, min assist to stabilize RW and occasional assit to place foot in proper position.       Exercises   Exercises  Other Exercises    Other Exercises   standing at RW with mat table behind for safety: alternating UE raises, bil UE raises, upper trunk rotation left<>right performed with min guard to  min assist for balance. cues on posture and ex form/technique. 8-10 reps each with seated rest breaks throughout. with last stand to RW once stable worked on forward pass for ~1 minute with cues on posture and assist for hip extension/trunk extension to assist with balance.                                 PT Education - 10/25/17 0843    Education Details  new standing ex's to take to trainer    Person(s) Educated  Patient    Methods  Explanation;Demonstration;Verbal cues;Handout    Comprehension  Verbalized understanding;Returned demonstration;Verbal cues required;Need further instruction       PT Short Term Goals - 10/03/17 1204      PT SHORT TERM GOAL #1   Title  Patient verbalizes & demonstrates understanding of updated HEP. (All STGs Target Date: 11/02/2017)    Time  1    Period  Months    Status  New    Target Date  11/02/17      PT SHORT TERM GOAL #2   Title  Patient tolerates standing activities for 7 minutes with RW support.     Time  1    Period  Months    Status  New    Target Date  11/02/17      PT SHORT TERM GOAL #3   Title  6-Minute Walk Test with RW & prosthesis >210'     Time  1    Period  Months    Status  New    Target Date  11/02/17        PT Long Term Goals - 10/03/17 1200      PT LONG TERM GOAL #1   Title  Patient demonstrates & verbalizes understanding of ongoing fitness plan. (All LTGs Target Date: 12/28/2017)    Time  3    Period  Months    Status  New    Target Date  12/28/17      PT LONG TERM GOAL #2   Title  Patient tolerates standing for 10 minutes with intermittent UE support performing standing ADLs with RW.     Time  3    Period  Months    Status  New    Target Date  12/28/17      PT LONG TERM GOAL #3   Title  Berg Balance tasks with walker support >/= 50/56.     Time  3    Period  Months    Status  New    Target Date  12/28/17      PT LONG TERM GOAL #4   Title  6-Minute Walk Test  with rolling walker & prosthesis >250'.      Time  3    Period  Months    Status  New    Target Date  12/28/17      PT LONG TERM GOAL #5   Title  Patient negotiates ramp, curb with RW & stairs with 2 rails with transfemoral prosthesis modified independent to enable community access.      Time  3    Period  Months    Status  New    Target Date  12/28/17            Plan - 10/25/17 0805    Clinical Impression Statement  Today's skilled session continued to focus on activity tolerance and balance reactions. The pt continues to need increased time to "mentally prepare" for any movement/activity, however did overcome this some with the 5 time sit to stand. Will plan to use the 5 time sit to stand periodically as a test to his strength and functional ability to overcome the need to "[facility] prepare" for movements. The pt is progressing toward goals and should benefit from continued PT to progress toward unmet goals.                        Rehab Potential  Good    PT Frequency  2x / week    PT Duration  12 weeks    PT Treatment/Interventions  ADLs/Self Care Home Management;Canalith Repostioning;DME Instruction;Gait training;Stair training;Functional mobility training;Therapeutic activities;Therapeutic exercise;Balance training;Neuromuscular re-education;Patient/family education;Prosthetic Training;Manual techniques;Vestibular    PT Next Visit Plan  Continue exercises to strength residual limb/hip muscles, standing balance & strength, endurance exercises    Consulted and Agree with Plan of Care  Patient       Patient will benefit from skilled therapeutic intervention in order to improve the following deficits and impairments:  Abnormal gait, Decreased activity tolerance, Decreased balance, Decreased endurance, Decreased knowledge of precautions, Decreased knowledge of use of DME, Decreased mobility, Decreased range of motion, Decreased strength, Dizziness, Impaired flexibility, Postural dysfunction, Prosthetic Dependency,  Obesity  Visit Diagnosis: Muscle weakness (generalized)  Other abnormalities of gait and mobility  Abnormal posture  Unsteadiness on feet     Problem List Patient Active Problem List   Diagnosis Date Noted  . Healthcare maintenance 08/15/2017  . Nausea and vomiting 06/08/2017  . Erectile dysfunction 04/22/2017  . Nocturnal enuresis 04/19/2017  . Blood in stool 03/07/2017  . History of nausea and vomiting 10/31/2016  . Prosthesis adjustment 08/17/2016  . History of pulmonary embolus (PE) 06/09/2016  . Normocytic anemia 01/30/2014  . Constipation   . Above knee amputation of right lower extremity ([place]) 12/26/2013  . GERD (gastroesophageal reflux disease) 08/14/2013  . Hyperlipidemia 02/19/2013  . S/P cardiac cath, 02/18/13, normal coronaries 02/19/2013  . CKD (chronic kidney disease) stage 3, GFR 30-59 ml/min (HCC) 02/19/2013  . Nephrotic syndrome 02/12/2012  . Hypertension 07/24/2011  . Long term current use of anticoagulant therapy 07/19/2011  . History of DVT (deep vein thrombosis) 07/28/2009  . PROTEINURIA, MILD 02/01/2007  . Morbid obesity ([place]) 10/26/2005  . Type II diabetes mellitus ([place]) 01/09/2002    Willow Ora, PTA, [employer] [address], [place], [place] [address] [phone] 10/25/17, 10:12 PM   Name: [name] MRN: [id number] Date of Birth: 04-Feb-1975

## 2017-10-25 NOTE — Patient Instructions (Signed)
Standing still with RW in front and chair/mat table behind you for safety: 1. Alternate raising arms up with emphasis on tall posture.  2. Raise both arms up together with emphasis on tall posture.  3. Alternate upper body rotation left<>right with reaching behind. Follow hand with head/eyes.

## 2017-10-26 ENCOUNTER — Other Ambulatory Visit: Payer: Self-pay | Admitting: Internal Medicine

## 2017-10-30 ENCOUNTER — Ambulatory Visit: Payer: Medicare Other

## 2017-10-31 ENCOUNTER — Telehealth: Payer: Self-pay | Admitting: *Deleted

## 2017-10-31 ENCOUNTER — Other Ambulatory Visit: Payer: Self-pay | Admitting: *Deleted

## 2017-10-31 DIAGNOSIS — E1165 Type 2 diabetes mellitus with hyperglycemia: Secondary | ICD-10-CM

## 2017-10-31 DIAGNOSIS — Z794 Long term (current) use of insulin: Principal | ICD-10-CM

## 2017-10-31 MED ORDER — GLUCOSE BLOOD VI STRP: ORAL_STRIP | 12 refills | Status: DC

## 2017-10-31 NOTE — Telephone Encounter (Signed)
Pt asking about refill on Tradjenta. Not on current med list; ? Discontinued on 10/18. Pt wants to be sure it had been discontinued. Told him I will ask his doctor. Thanks

## 2017-10-31 NOTE — Telephone Encounter (Signed)
Pt called - unable to get diabetes supplies; no dx code. Called [facility] - stated test strips need dx code; unable to "dx code" in comment section. I added it with the instructions. Please re-send rx. Thanks

## 2017-11-01 ENCOUNTER — Encounter: Payer: Self-pay | Admitting: Physical Therapy

## 2017-11-01 ENCOUNTER — Ambulatory Visit: Payer: Medicare Other | Admitting: Physical Therapy

## 2017-11-01 DIAGNOSIS — M6281 Muscle weakness (generalized): Secondary | ICD-10-CM | POA: Diagnosis not present

## 2017-11-01 DIAGNOSIS — R2689 Other abnormalities of gait and mobility: Secondary | ICD-10-CM

## 2017-11-01 DIAGNOSIS — R2681 Unsteadiness on feet: Secondary | ICD-10-CM

## 2017-11-01 DIAGNOSIS — M25651 Stiffness of right hip, not elsewhere classified: Secondary | ICD-10-CM | POA: Diagnosis not present

## 2017-11-01 DIAGNOSIS — Z9181 History of falling: Secondary | ICD-10-CM | POA: Diagnosis not present

## 2017-11-01 DIAGNOSIS — R293 Abnormal posture: Secondary | ICD-10-CM | POA: Diagnosis not present

## 2017-11-01 NOTE — Therapy (Signed)
[facility] [address] [place], [place], [address] Phone: [phone]   Fax:  [phone]  Physical Therapy Treatment  Patient Details  Name: [name] MRN: [id number] Date of Birth: 03-26-1975 Referring Provider (PT): [name], [place]   Encounter Date: 11/01/2017  PT End of Session - 11/01/17 1541    Visit Number  4    Number of Visits  25    Date for PT Re-Evaluation  01/01/18    Authorization Type  Medicare & Medicaid    PT Start Time  0800    PT Stop Time  0850    PT Time Calculation (min)  50 min    Activity Tolerance  Patient tolerated treatment well    Behavior During Therapy  [facility] for tasks assessed/performed       Past Medical History:  Diagnosis Date  . Acute venous embolism and thrombosis of deep vessels of proximal lower extremity ([place]) 07/19/2011  . Anemia   . Chest pain, neg MI, normal coronaries by cath 02/18/2013  . CKD (chronic kidney disease) stage 3, GFR 30-59 ml/min (HCC) 02/19/2013  . Colon polyps   . Diabetic ulcer of right foot ([place])   . DVT (deep venous thrombosis) ([place]) 09/2002   patient reports additional DVTs in '06 & '11 (unconfirmed)  . GERD (gastroesophageal reflux disease)   . History of blood transfusion    "related to OR" (10/31/2016)  . Hyperlipidemia 02/19/2013  . Hypertension   . Nausea & vomiting    "constant for the last couple weeks" (10/31/2016)  . Nephrotic syndrome   . Obesity    BMI 44, weight 346 pounds 01/30/14  . Pulmonary embolism ([place]) 09/2002   treated with 6 months of warfarin  . Type I diabetes mellitus ([place]) dx'd 2001    Past Surgical History:  Procedure Laterality Date  . AMPUTATION Right 12/22/2013   Procedure: AMPUTATION BELOW KNEE;  Surgeon: [name], MD;  Location: [place];  Service: Orthopedics;  Laterality: Right;  . AMPUTATION Right 02/19/2014   Procedure: RIGHT ABOVE KNEE AMPUTATION ;  Surgeon: [name], MD;  Location: [place];   Service: Orthopedics;  Laterality: Right;  . CARDIAC CATHETERIZATION  02/18/2013   normal coronaries  . ESOPHAGOGASTRODUODENOSCOPY N/A 11/02/2016   Procedure: ESOPHAGOGASTRODUODENOSCOPY (EGD);  Surgeon: [name], MD;  Location[facility] ENDOSCOPY;  Service: Endoscopy;  Laterality: N/A;  . I&D EXTREMITY Right 12/14/2013   Procedure: IRRIGATION AND DEBRIDEMENT RIGHT FOOT;  Surgeon: [name], MD;  Location: [place];  Service: Orthopedics;  Laterality: Right;  . INCISION AND DRAINAGE ABSCESS  2007; 2015   "back"  . INCISION AND DRAINAGE OF WOUND Right 12/22/2013   Procedure: I&D RIGHT BUTTOCK;  Surgeon: [name], MD;  Location: [place];  Service: Orthopedics;  Laterality: Right;  . LEFT HEART CATHETERIZATION WITH CORONARY ANGIOGRAM N/A 02/18/2013   Procedure: LEFT HEART CATHETERIZATION WITH CORONARY ANGIOGRAM;  Surgeon: [name], MD;  Location: [facility] CATH LAB;  Service: Cardiovascular;  Laterality: N/A;    There were no vitals filed for this visit.  Subjective Assessment - 11/01/17 0800    Subjective  He has been doing updated HEP at gym. No falls.     Pertinent History  R TFA, DM1, morbid obesity, CKD3, HTN, DVT/PE,     Limitations  Lifting;Standing;Walking;House hold activities    Patient Stated Goals  to walk better with prosthesis, build standing tolerance & endurance    Currently in Pain?  No/denies  Therapeutic Exercise: PT updated HEP with planks/bridge with RLE/residual limb on bolster and varying LLE for difficulty: supine for hip extensors, right sidelying for abductors, left sidelying for adductors and prone for hip flexors/core. PT used patient phone to videotape each exercise including difficulty variations.  PT also gave written directions for following his treadmill with overground gait with RW for same amount of time. Limiting rest between treadmill & RW gait to no greater than ambulation time and work on arising with less hesitancy.                            PT Short Term Goals - 11/01/17 1542      PT SHORT TERM GOAL #1   Title  Patient verbalizes & demonstrates understanding of updated HEP. (All STGs Target Date: 11/02/2017)    Baseline  met 11/01/2017    Time  1    Period  Months    Status  Achieved      PT SHORT TERM GOAL #2   Title  Patient tolerates standing activities for 7 minutes with RW support.     Time  1    Period  Months    Status  On-going      PT SHORT TERM GOAL #3   Title  6-Minute Walk Test with RW & prosthesis >210'     Time  1    Period  Months    Status  On-going        PT Long Term Goals - 10/03/17 1200      PT LONG TERM GOAL #1   Title  Patient demonstrates & verbalizes understanding of ongoing fitness plan. (All LTGs Target Date: 12/28/2017)    Time  3    Period  Months    Status  New    Target Date  12/28/17      PT LONG TERM GOAL #2   Title  Patient tolerates standing for 10 minutes with intermittent UE support performing standing ADLs with RW.     Time  3    Period  Months    Status  New    Target Date  12/28/17      PT LONG TERM GOAL #3   Title  Berg Balance tasks with walker support >/= 50/56.     Time  3    Period  Months    Status  New    Target Date  12/28/17      PT LONG TERM GOAL #4   Title  6-Minute Walk Test with rolling walker & prosthesis >250'.     Time  3    Period  Months    Status  New    Target Date  12/28/17      PT LONG TERM GOAL #5   Title  Patient negotiates ramp, curb with RW & stairs with 2 rails with transfemoral prosthesis modified independent to enable community access.      Time  3    Period  Months    Status  New    Target Date  12/28/17            Plan - 11/01/17 1551    Clinical Impression Statement  Patient has better understanding of updated HEP to address residual limb strength/right hip strength. Pt met that STG for updated HEP. Patient has attended only 3 treatment sessions so limited  progress towards STGs.     Rehab Potential  Good  PT Frequency  2x / week    PT Duration  12 weeks    PT Treatment/Interventions  ADLs/Self Care Home Management;Canalith Repostioning;DME Instruction;Gait training;Stair training;Functional mobility training;Therapeutic activities;Therapeutic exercise;Balance training;Neuromuscular re-education;Patient/family education;Prosthetic Training;Manual techniques;Vestibular    PT Next Visit Plan  assess remaining STGs, standing balance & strength, endurance exercises    Consulted and Agree with Plan of Care  Patient       Patient will benefit from skilled therapeutic intervention in order to improve the following deficits and impairments:  Abnormal gait, Decreased activity tolerance, Decreased balance, Decreased endurance, Decreased knowledge of precautions, Decreased knowledge of use of DME, Decreased mobility, Decreased range of motion, Decreased strength, Dizziness, Impaired flexibility, Postural dysfunction, Prosthetic Dependency, Obesity  Visit Diagnosis: Muscle weakness (generalized)  Other abnormalities of gait and mobility  Abnormal posture  Unsteadiness on feet     Problem List Patient Active Problem List   Diagnosis Date Noted  . Healthcare maintenance 08/15/2017  . Nausea and vomiting 06/08/2017  . Erectile dysfunction 04/22/2017  . Nocturnal enuresis 04/19/2017  . Blood in stool 03/07/2017  . History of nausea and vomiting 10/31/2016  . Prosthesis adjustment 08/17/2016  . History of pulmonary embolus (PE) 06/09/2016  . Normocytic anemia 01/30/2014  . Constipation   . Above knee amputation of right lower extremity ([place]) 12/26/2013  . GERD (gastroesophageal reflux disease) 08/14/2013  . Hyperlipidemia 02/19/2013  . S/P cardiac cath, 02/18/13, normal coronaries 02/19/2013  . CKD (chronic kidney disease) stage 3, GFR 30-59 ml/min (HCC) 02/19/2013  . Nephrotic syndrome 02/12/2012  . Hypertension 07/24/2011  . Long term  current use of anticoagulant therapy 07/19/2011  . History of DVT (deep vein thrombosis) 07/28/2009  . PROTEINURIA, MILD 02/01/2007  . Morbid obesity ([place]) 10/26/2005  . Type II diabetes mellitus ([place]) 01/09/2002    Ethal Gotay PT, DPT 11/01/2017, 3:54 PM  [facility] [address] [place], [place], [address] Phone: [phone]   Fax:  [phone]  Name: [name] MRN: [id number] Date of Birth: 04-Aug-1975

## 2017-11-01 NOTE — Telephone Encounter (Signed)
Patient is seeing an endocrinologist for his diabetes.  When looking at the last note it appears he should not be taking it.  However, I would recommend he follow up with their office to make sure

## 2017-11-01 NOTE — Patient Instructions (Signed)
Walking Program Walk on Treadmill for 5 minutes Rest 2-3 minutes (<5 minutes) Walk with walker for 5 minutes  Increase both to 6 minutes as able. Don't increase Treadmill unless you can match with walker.   We also need to work on being able to stand & start walking with minimal mental prep time.

## 2017-11-01 NOTE — Telephone Encounter (Signed)
Returned call to patient. No answer. Left message on VM requesting return call. L. Emerita Berkemeier, RN, BSN     

## 2017-11-01 NOTE — Telephone Encounter (Signed)
Pt is requesting letter to be written in regards to his Disability for a [employer] that will pay for his  Recumbent Exercise Bicycle.  The letter only needs to include that his is an above the knee amputee and his progress with Rehabilitation Please advise if the a letter can be written as soon as possible.  Pt states letter is to be written before November 09, 2017.

## 2017-11-01 NOTE — Telephone Encounter (Signed)
Patient returned call. Instructed to contact endocrinology office to be sure tradjenta has been discontinued. States he will. Also, states he's been in contact with [place] regarding [name] application and letter needed by PCP declaring patient's disability. States [name] app must be submitted by Nov 09, 2017. Will forward to [place] and PCP. Hubbard Hartshorn, RN, BSN

## 2017-11-04 ENCOUNTER — Other Ambulatory Visit: Payer: Self-pay | Admitting: Internal Medicine

## 2017-11-04 ENCOUNTER — Encounter: Payer: Self-pay | Admitting: Internal Medicine

## 2017-11-04 NOTE — Telephone Encounter (Signed)
Letter completed and in patient's chart

## 2017-11-05 ENCOUNTER — Encounter: Payer: Self-pay | Admitting: Internal Medicine

## 2017-11-05 NOTE — Telephone Encounter (Signed)
FYI:  Called the pt and he states he had an above knee amputation of right lower extremity and the letter states a right below knee amputation.  Please advise.

## 2017-11-05 NOTE — Telephone Encounter (Signed)
Re did letter.  Thanks

## 2017-11-06 ENCOUNTER — Ambulatory Visit: Payer: Medicare Other | Admitting: Physical Therapy

## 2017-11-06 ENCOUNTER — Telehealth: Payer: Self-pay | Admitting: *Deleted

## 2017-11-06 ENCOUNTER — Ambulatory Visit (HOSPITAL_BASED_OUTPATIENT_CLINIC_OR_DEPARTMENT_OTHER): Payer: Medicare Other | Attending: Internal Medicine | Admitting: Internal Medicine

## 2017-11-06 ENCOUNTER — Encounter: Payer: Self-pay | Admitting: Physical Therapy

## 2017-11-06 VITALS — Ht 74.0 in | Wt 375.0 lb

## 2017-11-06 DIAGNOSIS — R2681 Unsteadiness on feet: Secondary | ICD-10-CM

## 2017-11-06 DIAGNOSIS — Z9181 History of falling: Secondary | ICD-10-CM | POA: Diagnosis not present

## 2017-11-06 DIAGNOSIS — M6281 Muscle weakness (generalized): Secondary | ICD-10-CM

## 2017-11-06 DIAGNOSIS — M25651 Stiffness of right hip, not elsewhere classified: Secondary | ICD-10-CM | POA: Diagnosis not present

## 2017-11-06 DIAGNOSIS — N3944 Nocturnal enuresis: Secondary | ICD-10-CM

## 2017-11-06 DIAGNOSIS — R293 Abnormal posture: Secondary | ICD-10-CM

## 2017-11-06 DIAGNOSIS — R2689 Other abnormalities of gait and mobility: Secondary | ICD-10-CM | POA: Diagnosis not present

## 2017-11-06 DIAGNOSIS — G4733 Obstructive sleep apnea (adult) (pediatric): Secondary | ICD-10-CM | POA: Diagnosis not present

## 2017-11-06 NOTE — Telephone Encounter (Signed)
Pt called stating he could not get his strips and lancets, [facility], [facility] on [facility], [facility], spoke w/ betty she states she will cancel old scripts and bring new ones up, pt will pick up this evening. Pt was ask to now test 4x daily, 1x before meals and at bedtime, he is agreeable, do you agree?

## 2017-11-06 NOTE — Patient Instructions (Signed)
New Walking pattern Goal is upright posture with less arm support Sequence is  Step Right leg / prosthesis Move RW only to back legs are even with toes of prosthesis. RW movement is 50% arms & 50% weight shift of pelvis over front leg. Check posture before stepping. Step left leg Move RW only to back legs are even with toes of left leg. RW movement is 50% arms & 50% weight shift of pelvis over front leg. Check posture before stepping. Start sequence again with prosthesis step

## 2017-11-06 NOTE — Therapy (Signed)
[facility] [address], [place], [address] Phone: [phone]   Fax:  [phone]  Physical Therapy Treatment  Patient Details  Name: [name] MRN: [id number] Date of Birth: 07-08-1975 Referring Provider (PT): [name], [place]   Encounter Date: 11/06/2017  PT End of Session - 11/06/17 0811    Visit Number  5    Number of Visits  25    Date for PT Re-Evaluation  01/01/18    Authorization Type  Medicare & Medicaid    PT Start Time  0803    PT Stop Time  0845    PT Time Calculation (min)  42 min    Activity Tolerance  Patient tolerated treatment well    Behavior During Therapy  [facility] for tasks assessed/performed       Past Medical History:  Diagnosis Date  . Acute venous embolism and thrombosis of deep vessels of proximal lower extremity ([place]) 07/19/2011  . Anemia   . Chest pain, neg MI, normal coronaries by cath 02/18/2013  . CKD (chronic kidney disease) stage 3, GFR 30-59 ml/min (HCC) 02/19/2013  . Colon polyps   . Diabetic ulcer of right foot ([place])   . DVT (deep venous thrombosis) ([place]) 09/2002   patient reports additional DVTs in '06 & '11 (unconfirmed)  . GERD (gastroesophageal reflux disease)   . History of blood transfusion    "related to OR" (10/31/2016)  . Hyperlipidemia 02/19/2013  . Hypertension   . Nausea & vomiting    "constant for the last couple weeks" (10/31/2016)  . Nephrotic syndrome   . Obesity    BMI 44, weight 346 pounds 01/30/14  . Pulmonary embolism ([place]) 09/2002   treated with 6 months of warfarin  . Type I diabetes mellitus ([place]) dx'd 2001    Past Surgical History:  Procedure Laterality Date  . AMPUTATION Right 12/22/2013   Procedure: AMPUTATION BELOW KNEE;  Surgeon: [name], MD;  Location: [facility];  Service: Orthopedics;  Laterality: Right;  . AMPUTATION Right 02/19/2014   Procedure: RIGHT ABOVE KNEE AMPUTATION ;  Surgeon: [name], MD;  Location: [facility];   Service: Orthopedics;  Laterality: Right;  . CARDIAC CATHETERIZATION  02/18/2013   normal coronaries  . ESOPHAGOGASTRODUODENOSCOPY N/A 11/02/2016   Procedure: ESOPHAGOGASTRODUODENOSCOPY (EGD);  Surgeon: [name], MD;  [facility]: [facility] ENDOSCOPY;  Service: Endoscopy;  Laterality: N/A;  . I&D EXTREMITY Right 12/14/2013   Procedure: IRRIGATION AND DEBRIDEMENT RIGHT FOOT;  Surgeon: [name], MD;  Location: [facility];  Service: Orthopedics;  Laterality: Right;  . INCISION AND DRAINAGE ABSCESS  2007; 2015   "back"  . INCISION AND DRAINAGE OF WOUND Right 12/22/2013   Procedure: I&D RIGHT BUTTOCK;  Surgeon: [name], MD;  Location: [facility];  Service: Orthopedics;  Laterality: Right;  . LEFT HEART CATHETERIZATION WITH CORONARY ANGIOGRAM N/A 02/18/2013   Procedure: LEFT HEART CATHETERIZATION WITH CORONARY ANGIOGRAM;  Surgeon: [name], MD;  Location: [facility] CATH LAB;  Service: Cardiovascular;  Laterality: N/A;    There were no vitals filed for this visit.  Subjective Assessment - 11/06/17 0805    Subjective  He reports doing Treadmill followed by RW gait at gym. He has not gotten to updated HEP but understands. In response to PT recommendation to perform at home if no time at gym. He is seeing prosthetist today to cut liner away from groin. He continues to work on type of underwear to control his testicles from getting into  socket area.     Pertinent History  R TFA, DM1, morbid obesity, CKD3, HTN, DVT/PE,     Limitations  Lifting;Standing;Walking;House hold activities    Patient Stated Goals  to walk better with prosthesis, build standing tolerance & endurance    Currently in Pain?  No/denies      Prosthetic Training:  Pt tolerated standing for 7 minutes with RW support & PT cues on upright posture.  PT demo, instructed in new sequence: RW, RLE, RW, LLE  New Walking pattern Goal is upright posture with less arm support Sequence is  Step Right leg / prosthesis Move RW only to back legs are  even with toes of prosthesis. RW movement is 50% arms & 50% weight shift of pelvis over front leg. Check posture before stepping. Step left leg Move RW only to back legs are even with toes of left leg. RW movement is 50% arms & 50% weight shift of pelvis over front leg. Check posture before stepping. Start sequence again with prosthesis step  Pt ambulated 100' with new pattern with RW with cues for less UE weight bearing & upright posture.  Pt request: PT demo treadmill gait to incorporate decrease UE weight bearing, upright posture & wt shift over prosthesis in stance. PT recommends working on Treadmill only on day that he has not done treadmill at gym prior to PT. Pt verbalized understanding.                            PT Short Term Goals - 11/06/17 1412      PT SHORT TERM GOAL #1   Title  Patient verbalizes & demonstrates understanding of updated HEP. (All STGs Target Date: 11/02/2017)    Baseline  met 11/01/2017    Time  1    Period  Months    Status  Achieved      PT SHORT TERM GOAL #2   Title  Patient tolerates standing activities for 7 minutes with RW support.     Baseline  MET 11/06/2017    Time  1    Period  Months    Status  Achieved      PT SHORT TERM GOAL #3   Title  6-Minute Walk Test with RW & prosthesis >220'     Time  1    Period  Months    Status  On-going    Target Date  12/05/17        PT Long Term Goals - 10/03/17 1200      PT LONG TERM GOAL #1   Title  Patient demonstrates & verbalizes understanding of ongoing fitness plan. (All LTGs Target Date: 12/28/2017)    Time  3    Period  Months    Status  New    Target Date  12/28/17      PT LONG TERM GOAL #2   Title  Patient tolerates standing for 10 minutes with intermittent UE support performing standing ADLs with RW.     Time  3    Period  Months    Status  New    Target Date  12/28/17      PT LONG TERM GOAL #3   Title  Berg Balance tasks with walker support >/= 50/56.      Time  3    Period  Months    Status  New    Target Date  12/28/17      PT  LONG TERM GOAL #4   Title  6-Minute Walk Test with rolling walker & prosthesis >250'.     Time  3    Period  Months    Status  New    Target Date  12/28/17      PT LONG TERM GOAL #5   Title  Patient negotiates ramp, curb with RW & stairs with 2 rails with transfemoral prosthesis modified independent to enable community access.      Time  3    Period  Months    Status  New    Target Date  12/28/17            Plan - 11/05/17 1410    Clinical Impression Statement  Patient reports better understanding of prosthetic gait with instruction in new sequence & posture to decrease amount of UE weight bearing. Patient continues to need increased time to engage in activities like standing & gait.     Rehab Potential  Good    PT Frequency  2x / week    PT Duration  12 weeks    PT Treatment/Interventions  ADLs/Self Care Home Management;Canalith Repostioning;DME Instruction;Gait training;Stair training;Functional mobility training;Therapeutic activities;Therapeutic exercise;Balance training;Neuromuscular re-education;Patient/family education;Prosthetic Training;Manual techniques;Vestibular    PT Next Visit Plan  review new gait sequence/technique, Pt requesting to work on treadmill technique as he uses one at his gym. standing balance & strength, endurance exercises    Consulted and Agree with Plan of Care  Patient       Patient will benefit from skilled therapeutic intervention in order to improve the following deficits and impairments:  Abnormal gait, Decreased activity tolerance, Decreased balance, Decreased endurance, Decreased knowledge of precautions, Decreased knowledge of use of DME, Decreased mobility, Decreased range of motion, Decreased strength, Dizziness, Impaired flexibility, Postural dysfunction, Prosthetic Dependency, Obesity  Visit Diagnosis: Muscle weakness (generalized)  Other abnormalities of gait  and mobility  Abnormal posture  Unsteadiness on feet     Problem List Patient Active Problem List   Diagnosis Date Noted  . Healthcare maintenance 08/15/2017  . Nausea and vomiting 06/08/2017  . Erectile dysfunction 04/22/2017  . Nocturnal enuresis 04/19/2017  . Blood in stool 03/07/2017  . History of nausea and vomiting 10/31/2016  . Prosthesis adjustment 08/17/2016  . History of pulmonary embolus (PE) 06/09/2016  . Normocytic anemia 01/30/2014  . Constipation   . Above knee amputation of right lower extremity ([place]) 12/26/2013  . GERD (gastroesophageal reflux disease) 08/14/2013  . Hyperlipidemia 02/19/2013  . S/P cardiac cath, 02/18/13, normal coronaries 02/19/2013  . CKD (chronic kidney disease) stage 3, GFR 30-59 ml/min (HCC) 02/19/2013  . Nephrotic syndrome 02/12/2012  . Hypertension 07/24/2011  . Long term current use of anticoagulant therapy 07/19/2011  . History of DVT (deep vein thrombosis) 07/28/2009  . PROTEINURIA, MILD 02/01/2007  . Morbid obesity ([place]) 10/26/2005  . Type II diabetes mellitus ([place]) 01/09/2002    Devanny Palecek PT, DPT 11/06/2017, 2:14 PM  [facility] [address] [place], [place], [address] Phone: [phone]   Fax:  [phone]  Name: Ova Meegan MRN: [id number] Date of Birth: 21-Mar-1975

## 2017-11-06 NOTE — Telephone Encounter (Signed)
agree

## 2017-11-08 ENCOUNTER — Ambulatory Visit: Payer: Medicare Other | Admitting: Physical Therapy

## 2017-11-12 DIAGNOSIS — Z794 Long term (current) use of insulin: Secondary | ICD-10-CM | POA: Diagnosis not present

## 2017-11-12 DIAGNOSIS — Z86718 Personal history of other venous thrombosis and embolism: Secondary | ICD-10-CM | POA: Diagnosis not present

## 2017-11-12 DIAGNOSIS — N189 Chronic kidney disease, unspecified: Secondary | ICD-10-CM | POA: Diagnosis not present

## 2017-11-12 DIAGNOSIS — D631 Anemia in chronic kidney disease: Secondary | ICD-10-CM | POA: Diagnosis not present

## 2017-11-12 DIAGNOSIS — N183 Chronic kidney disease, stage 3 (moderate): Secondary | ICD-10-CM | POA: Diagnosis not present

## 2017-11-12 DIAGNOSIS — E1122 Type 2 diabetes mellitus with diabetic chronic kidney disease: Secondary | ICD-10-CM | POA: Diagnosis not present

## 2017-11-12 DIAGNOSIS — N2581 Secondary hyperparathyroidism of renal origin: Secondary | ICD-10-CM | POA: Diagnosis not present

## 2017-11-12 DIAGNOSIS — I129 Hypertensive chronic kidney disease with stage 1 through stage 4 chronic kidney disease, or unspecified chronic kidney disease: Secondary | ICD-10-CM | POA: Diagnosis not present

## 2017-11-13 ENCOUNTER — Encounter: Payer: Self-pay | Admitting: Physical Therapy

## 2017-11-13 ENCOUNTER — Ambulatory Visit: Payer: Medicare Other | Attending: Internal Medicine | Admitting: Physical Therapy

## 2017-11-13 DIAGNOSIS — R2681 Unsteadiness on feet: Secondary | ICD-10-CM | POA: Diagnosis not present

## 2017-11-13 DIAGNOSIS — R293 Abnormal posture: Secondary | ICD-10-CM | POA: Insufficient documentation

## 2017-11-13 DIAGNOSIS — R2689 Other abnormalities of gait and mobility: Secondary | ICD-10-CM | POA: Insufficient documentation

## 2017-11-13 DIAGNOSIS — M6281 Muscle weakness (generalized): Secondary | ICD-10-CM | POA: Insufficient documentation

## 2017-11-13 DIAGNOSIS — M25651 Stiffness of right hip, not elsewhere classified: Secondary | ICD-10-CM | POA: Diagnosis not present

## 2017-11-13 NOTE — Therapy (Signed)
[facility] [address] [place], [place], [address] Phone: [phone]   Fax:  [phone]  Physical Therapy Treatment  Patient Details  Name: [name] MRN: [id number] Date of Birth: 05/03/1975 Referring Provider (PT): [name], [place]   Encounter Date: 11/13/2017  PT End of Session - 11/13/17 0913    Visit Number  6    Number of Visits  25    Date for PT Re-Evaluation  01/01/18    Authorization Type  Medicare & Medicaid    PT Start Time  0800    PT Stop Time  0846    PT Time Calculation (min)  46 min    Activity Tolerance  Patient tolerated treatment well    Behavior During Therapy  [facility] for tasks assessed/performed       Past Medical History:  Diagnosis Date  . Acute venous embolism and thrombosis of deep vessels of proximal lower extremity ([place]) 07/19/2011  . Anemia   . Chest pain, neg MI, normal coronaries by cath 02/18/2013  . CKD (chronic kidney disease) stage 3, GFR 30-59 ml/min (HCC) 02/19/2013  . Colon polyps   . Diabetic ulcer of right foot ([place])   . DVT (deep venous thrombosis) ([place]) 09/2002   patient reports additional DVTs in '06 & '11 (unconfirmed)  . GERD (gastroesophageal reflux disease)   . History of blood transfusion    "related to OR" (10/31/2016)  . Hyperlipidemia 02/19/2013  . Hypertension   . Nausea & vomiting    "constant for the last couple weeks" (10/31/2016)  . Nephrotic syndrome   . Obesity    BMI 44, weight 346 pounds 01/30/14  . Pulmonary embolism ([place]) 09/2002   treated with 6 months of warfarin  . Type I diabetes mellitus ([place]) dx'd 2001    Past Surgical History:  Procedure Laterality Date  . AMPUTATION Right 12/22/2013   Procedure: AMPUTATION BELOW KNEE;  Surgeon: [name], MD;  Location: [facility];  Service: Orthopedics;  Laterality: Right;  . AMPUTATION Right 02/19/2014   Procedure: RIGHT ABOVE KNEE AMPUTATION ;  Surgeon: [name], MD;  Location: [facility];   Service: Orthopedics;  Laterality: Right;  . CARDIAC CATHETERIZATION  02/18/2013   normal coronaries  . ESOPHAGOGASTRODUODENOSCOPY N/A 11/02/2016   Procedure: ESOPHAGOGASTRODUODENOSCOPY (EGD);  Surgeon: [name], MD;  Location: [facility] ENDOSCOPY;  Service: Endoscopy;  Laterality: N/A;  . I&D EXTREMITY Right 12/14/2013   Procedure: IRRIGATION AND DEBRIDEMENT RIGHT FOOT;  Surgeon: [name], MD;  Location: [facility];  Service: Orthopedics;  Laterality: Right;  . INCISION AND DRAINAGE ABSCESS  2007; 2015   "back"  . INCISION AND DRAINAGE OF WOUND Right 12/22/2013   Procedure: I&D RIGHT BUTTOCK;  Surgeon: [name], MD;  Location: [name];  Service: Orthopedics;  Laterality: Right;  . LEFT HEART CATHETERIZATION WITH CORONARY ANGIOGRAM N/A 02/18/2013   Procedure: LEFT HEART CATHETERIZATION WITH CORONARY ANGIOGRAM;  Surgeon: [name], MD;  Location: [facility] CATH LAB;  Service: Cardiovascular;  Laterality: N/A;    There were no vitals filed for this visit.  Subjective Assessment - 11/13/17 0800    Subjective  He has been using new walking method with RW and it has improved his quality & speed. He also had prosthetic liner cut in groin & wearing different underwear which has improved issues with testicles getting caught.     Pertinent History  R TFA, DM1, morbid obesity, CKD3, HTN, DVT/PE,     Limitations  Lifting;Standing;Walking;House  hold activities    Patient Stated Goals  to walk better with prosthesis, build standing tolerance & endurance    Currently in Pain?  No/denies       PT used [employer] BMI calculator to determine BMI with amputation:  6'2" 375#    52.9% BMI Sit to stand with cues for position/technique & extending prosthetic knee prior to reaching with RUE for RW as prosthesis will not support with knee flexed. Pt ambulated 100' X 2 working on technique with RW position to facilitate upright posture & less UE support / weight bearing on RW.   Neuromuscular Re-education   Standing posture / balance: standing with posterior to counter & RW anterior but position to facilitate upright posture - Mountain pose Standing with one foot stepped forward Warrior I pose but back leg not rotated.  See pt education. Pt return demo both with tactile & verbal cues.                        PT Education - 11/13/17 0916    Education Details  modified Yoga positions ([place])  Classes at [facility] ([place]) that patient may attend to get ideas on new exercises.     Person(s) Educated  Patient    Methods  Explanation;Demonstration;Tactile cues;Verbal cues;Handout    Comprehension  Verbalized understanding;Verbal cues required;Returned demonstration;Tactile cues required;Need further instruction       PT Short Term Goals - 11/06/17 1412      PT SHORT TERM GOAL #1   Title  Patient verbalizes & demonstrates understanding of updated HEP. (All STGs Target Date: 11/02/2017)    Baseline  met 11/01/2017    Time  1    Period  Months    Status  Achieved      PT SHORT TERM GOAL #2   Title  Patient tolerates standing activities for 7 minutes with RW support.     Baseline  MET 11/06/2017    Time  1    Period  Months    Status  Achieved      PT SHORT TERM GOAL #3   Title  6-Minute Walk Test with RW & prosthesis >220'     Time  1    Period  Months    Status  On-going    Target Date  12/05/17        PT Long Term Goals - 11/13/17 0914      PT LONG TERM GOAL #1   Title  Patient demonstrates & verbalizes understanding of ongoing fitness plan. (All LTGs Target Date: 12/28/2017)    Time  3    Period  Months    Status  On-going    Target Date  12/28/17      PT LONG TERM GOAL #2   Title  Patient tolerates standing for 10 minutes with intermittent UE support performing standing ADLs with RW.     Time  3    Period  Months    Status  On-going    Target Date  12/28/17      PT LONG TERM GOAL #3   Title  Berg Balance  tasks with walker support >/= 50/56.     Time  3    Period  Months    Status  On-going    Target Date  12/28/17      PT LONG TERM GOAL #4   Title  6-Minute Walk Test with  rolling walker & prosthesis >250'.     Time  3    Period  Months    Status  On-going    Target Date  12/28/17      PT LONG TERM GOAL #5   Title  Patient negotiates ramp, curb with RW & stairs with 2 rails with transfemoral prosthesis modified independent to enable community access.      Time  3    Period  Months    Status  On-going    Target Date  12/28/17            Plan - 11/13/17 0914    Clinical Impression Statement  Patient improved gait with cues on upright posture. He fatigued due to workout at gym this morning prior to PT. He seems to understand how modified Yoga positions will improve balance & gait.     Rehab Potential  Good    PT Frequency  2x / week    PT Duration  12 weeks    PT Treatment/Interventions  ADLs/Self Care Home Management;Canalith Repostioning;DME Instruction;Gait training;Stair training;Functional mobility training;Therapeutic activities;Therapeutic exercise;Balance training;Neuromuscular re-education;Patient/family education;Prosthetic Training;Manual techniques;Vestibular    PT Next Visit Plan  Prosthetic gait & balance working on form & decreasing UE support. Pt requesting to work on treadmill technique as he uses one at his gym. standing balance & strength, endurance exercises    Consulted and Agree with Plan of Care  Patient       Patient will benefit from skilled therapeutic intervention in order to improve the following deficits and impairments:  Abnormal gait, Decreased activity tolerance, Decreased balance, Decreased endurance, Decreased knowledge of precautions, Decreased knowledge of use of DME, Decreased mobility, Decreased range of motion, Decreased strength, Dizziness, Impaired flexibility, Postural dysfunction, Prosthetic Dependency, Obesity  Visit Diagnosis: Muscle  weakness (generalized)  Other abnormalities of gait and mobility  Abnormal posture  Unsteadiness on feet     Problem List Patient Active Problem List   Diagnosis Date Noted  . Healthcare maintenance 08/15/2017  . Nausea and vomiting 06/08/2017  . Erectile dysfunction 04/22/2017  . Nocturnal enuresis 04/19/2017  . Blood in stool 03/07/2017  . History of nausea and vomiting 10/31/2016  . Prosthesis adjustment 08/17/2016  . History of pulmonary embolus (PE) 06/09/2016  . Normocytic anemia 01/30/2014  . Constipation   . Above knee amputation of right lower extremity ([place]) 12/26/2013  . GERD (gastroesophageal reflux disease) 08/14/2013  . Hyperlipidemia 02/19/2013  . S/P cardiac cath, 02/18/13, normal coronaries 02/19/2013  . CKD (chronic kidney disease) stage 3, GFR 30-59 ml/min (HCC) 02/19/2013  . Nephrotic syndrome 02/12/2012  . Hypertension 07/24/2011  . Long term current use of anticoagulant therapy 07/19/2011  . History of DVT (deep vein thrombosis) 07/28/2009  . PROTEINURIA, MILD 02/01/2007  . Morbid obesity ([place]) 10/26/2005  . Type II diabetes mellitus ([place]) 01/09/2002    Kora Groom PT, DPT 11/13/2017, 9:19 AM  [facility] [address] [place], [place], [address] Phone: [phone]   Fax:  [phone]  Name: [name] MRN: [id number] Date of Birth: 22-Jan-1975

## 2017-11-13 NOTE — Patient Instructions (Signed)
Access Code: [email or web address]  URL: [email or web address]  Date: 11/13/2017  Prepared by:  pose with feet apart - 10 reps - 1 sets - 5-10 seconds hold - 1-3x daily - 7x weekly  Warrior I - 10 reps - 1 sets - 5-10 seconds hold - 1-3x daily - 7x weekly

## 2017-11-14 DIAGNOSIS — N3944 Nocturnal enuresis: Secondary | ICD-10-CM | POA: Diagnosis not present

## 2017-11-14 DIAGNOSIS — G4733 Obstructive sleep apnea (adult) (pediatric): Secondary | ICD-10-CM | POA: Diagnosis not present

## 2017-11-14 NOTE — Procedures (Signed)
   Patient Name: [name], [name] Date: 11/06/2017 Gender: Male D.O.B: 1975/08/03 Age (years): 72 Referring Provider: Oda Kilts Height (inches): 99 Interpreting Physician: Baird Lyons MD, ABSM Weight (lbs): 315 RPSGT: Carolin Coy BMI: 40 MRN: [id number] Neck Size: 19.50  CLINICAL INFORMATION Sleep Study Type: NPSG Indication for sleep study: Diabetes, Hypertension, Morbid Obesity, Obesity, Snoring Epworth Sleepiness Score: 8  SLEEP STUDY TECHNIQUE As per the AASM Manual for the Scoring of Sleep and Associated Events v2.3 (April 2016) with a hypopnea requiring 4% desaturations.  The channels recorded and monitored were frontal, central and occipital EEG, electrooculogram (EOG), submentalis EMG (chin), nasal and oral airflow, thoracic and abdominal wall motion, anterior tibialis EMG, snore microphone, electrocardiogram, and pulse oximetry.  MEDICATIONS Medications self-administered by patient taken the night of the study : melatonin  SLEEP ARCHITECTURE The study was initiated at 10:47:10 PM and ended at 5:00:41 AM.  Sleep onset time was 0.3 minutes and the sleep efficiency was 70.7%%. The total sleep time was 264 minutes.  Stage REM latency was 88.0 minutes.  The patient spent 2.7%% of the night in stage N1 sleep, 83.9%% in stage N2 sleep, 0.0%% in stage N3 and 13.5% in REM.  Alpha intrusion was absent.  Supine sleep was 100.00%.  RESPIRATORY PARAMETERS The overall apnea/hypopnea index (AHI) was 9.8 per hour. There were 5 total apneas, including 5 obstructive, 0 central and 0 mixed apneas. There were 38 hypopneas and 10 RERAs.  The AHI during Stage REM sleep was 33.8 per hour.  AHI while supine was 9.8 per hour.  The mean oxygen saturation was 96.5%. The minimum SpO2 during sleep was 86.0%.  moderate snoring was noted during this study.  CARDIAC DATA The 2 lead EKG demonstrated sinus rhythm. The mean heart rate was 90.2 beats per minute. Other EKG  findings include: None.  LEG MOVEMENT DATA The total PLMS were 0 with a resulting PLMS index of 0.0. Associated arousal with leg movement index was 0.0 .  IMPRESSIONS - Mild obstructive sleep apnea occurred during this study (AHI = 9.8/h). - No significant central sleep apnea occurred during this study (CAI = 0.0/h). - Mild oxygen desaturation was noted during this study (Min O2 = 86.0%). Mean 96.5%. - The patient snored with moderate snoring volume. - No cardiac abnormalities were noted during this study. - Clinically significant periodic limb movements did not occur during sleep. No significant associated arousals.  DIAGNOSIS - Obstructive Sleep Apnea (327.23 [G47.33 ICD-10])  RECOMMENDATIONS - Suggest CPAP titration sleep study, DME autopap, or sleep meddical consultation. Other options may include a fitted oral appliance or ENT evaluation. - Positional therapy avoiding supine position during sleep. - Be careful with alcohol, sedatives and other CNS depressants that may worsen sleep apnea and disrupt normal sleep architecture. - Sleep hygiene should be reviewed to assess factors that may improve sleep quality. - Weight management and regular exercise should be initiated or continued if appropriate.  [Electronically signed] 11/14/2017 03:31 PM  Baird Lyons MD, [place], American Board of Sleep Medicine   NPI: [id number]                          [place], [place] [facility]  ELECTRONICALLY SIGNED ON:  11/14/2017, 3:28 PM [place] PH: [phone]   FX: [phone] [place]

## 2017-11-15 ENCOUNTER — Ambulatory Visit: Payer: Medicare Other | Admitting: Physical Therapy

## 2017-11-20 ENCOUNTER — Encounter: Payer: Self-pay | Admitting: Physical Therapy

## 2017-11-20 ENCOUNTER — Ambulatory Visit: Payer: Medicare Other | Admitting: Physical Therapy

## 2017-11-20 DIAGNOSIS — M6281 Muscle weakness (generalized): Secondary | ICD-10-CM

## 2017-11-20 DIAGNOSIS — R2681 Unsteadiness on feet: Secondary | ICD-10-CM

## 2017-11-20 DIAGNOSIS — R293 Abnormal posture: Secondary | ICD-10-CM

## 2017-11-20 DIAGNOSIS — R2689 Other abnormalities of gait and mobility: Secondary | ICD-10-CM

## 2017-11-20 DIAGNOSIS — M25651 Stiffness of right hip, not elsewhere classified: Secondary | ICD-10-CM

## 2017-11-20 NOTE — Therapy (Signed)
[facility] [address] [place], [place], [address] Phone: [phone]   Fax:  [phone]  Physical Therapy Treatment  Patient Details  Name: [name] MRN: [id number] Date of Birth: [birth year] Referring Provider (PT): [name], [place]   Encounter Date: 11/20/2017    Past Medical History:  Diagnosis Date  . Acute venous embolism and thrombosis of deep vessels of proximal lower extremity ([place]) 07/19/2011  . Anemia   . Chest pain, neg MI, normal coronaries by cath 02/18/2013  . CKD (chronic kidney disease) stage 3, GFR 30-59 ml/min (HCC) 02/19/2013  . Colon polyps   . Diabetic ulcer of right foot ([place])   . DVT (deep venous thrombosis) ([place]) 09/2002   patient reports additional DVTs in '06 & '11 (unconfirmed)  . GERD (gastroesophageal reflux disease)   . History of blood transfusion    "related to OR" (10/31/2016)  . Hyperlipidemia 02/19/2013  . Hypertension   . Nausea & vomiting    "constant for the last couple weeks" (10/31/2016)  . Nephrotic syndrome   . Obesity    BMI 44, weight 346 pounds 01/30/14  . Pulmonary embolism ([place]) 09/2002   treated with 6 months of warfarin  . Type I diabetes mellitus ([place]) dx'd 2001    Past Surgical History:  Procedure Laterality Date  . AMPUTATION Right 12/22/2013   Procedure: AMPUTATION BELOW KNEE;  Surgeon: [name], MD;  Location: [facility];  Service: Orthopedics;  Laterality: Right;  . AMPUTATION Right 02/19/2014   Procedure: RIGHT ABOVE KNEE AMPUTATION ;  Surgeon: [name], MD;  Location: [facility];  Service: Orthopedics;  Laterality: Right;  . CARDIAC CATHETERIZATION  02/18/2013   normal coronaries  . ESOPHAGOGASTRODUODENOSCOPY N/A 11/02/2016   Procedure: ESOPHAGOGASTRODUODENOSCOPY (EGD);  Surgeon: [name], MD;  Location[facility];  Service: Endoscopy;  Laterality: N/A;  . I&D EXTREMITY Right 12/14/2013   Procedure: IRRIGATION AND DEBRIDEMENT RIGHT  FOOT;  Surgeon: [name], MD;  Location: [facility];  Service: Orthopedics;  Laterality: Right;  . INCISION AND DRAINAGE ABSCESS  2007; 2015   "back"  . INCISION AND DRAINAGE OF WOUND Right 12/22/2013   Procedure: I&D RIGHT BUTTOCK;  Surgeon: [name], MD;  Location: [facility];  Service: Orthopedics;  Laterality: Right;  . LEFT HEART CATHETERIZATION WITH CORONARY ANGIOGRAM N/A 02/18/2013   Procedure: LEFT HEART CATHETERIZATION WITH CORONARY ANGIOGRAM;  Surgeon: [name], MD;  Location: [facility] CATH LAB;  Service: Cardiovascular;  Laterality: N/A;    There were no vitals filed for this visit.  Subjective Assessment - 11/20/17 0800    Subjective  The workout at gym wore me out. He is doing new modified Yoga positions at gym & home.     Pertinent History  R TFA, DM1, morbid obesity, CKD3, HTN, DVT/PE,     Limitations  Lifting;Standing;Walking;House hold activities    Patient Stated Goals  to walk better with prosthesis, build standing tolerance & endurance    Currently in Pain?  No/denies       Patient arrived to PT session with noted extreme fatigue.  He reports hard workout at his gym that he attends 5 days /week with early morning workouts. His gym that focuses on body building, heavy lifting. These workouts tend to fatigue patient and limit his PT & other functions during day. PT has requested that patient not perform heavy, intense workouts prior to PT to enable better participation.  He also reports that he took diabetic medications  this morning with only eating a Power Bar as he did not have much appetite. PT educated on fluctuating blood glucoses, high A1Cs and foods that digest at different rates. Pt verbalized understanding.  No care today due to extreme fatigue.                          PT Short Term Goals - 11/06/17 1412      PT SHORT TERM GOAL #1   Title  Patient verbalizes & demonstrates understanding of updated HEP. (All STGs Target Date: 11/02/2017)     Baseline  met 11/01/2017    Time  1    Period  Months    Status  Achieved      PT SHORT TERM GOAL #2   Title  Patient tolerates standing activities for 7 minutes with RW support.     Baseline  MET 11/06/2017    Time  1    Period  Months    Status  Achieved      PT SHORT TERM GOAL #3   Title  6-Minute Walk Test with RW & prosthesis >220'     Time  1    Period  Months    Status  On-going    Target Date  12/05/17        PT Long Term Goals - 11/13/17 0914      PT LONG TERM GOAL #1   Title  Patient demonstrates & verbalizes understanding of ongoing fitness plan. (All LTGs Target Date: 12/28/2017)    Time  3    Period  Months    Status  On-going    Target Date  12/28/17      PT LONG TERM GOAL #2   Title  Patient tolerates standing for 10 minutes with intermittent UE support performing standing ADLs with RW.     Time  3    Period  Months    Status  On-going    Target Date  12/28/17      PT LONG TERM GOAL #3   Title  Berg Balance tasks with walker support >/= 50/56.     Time  3    Period  Months    Status  On-going    Target Date  12/28/17      PT LONG TERM GOAL #4   Title  6-Minute Walk Test with rolling walker & prosthesis >250'.     Time  3    Period  Months    Status  On-going    Target Date  12/28/17      PT LONG TERM GOAL #5   Title  Patient negotiates ramp, curb with RW & stairs with 2 rails with transfemoral prosthesis modified independent to enable community access.      Time  3    Period  Months    Status  On-going    Target Date  12/28/17              Patient will benefit from skilled therapeutic intervention in order to improve the following deficits and impairments:     Visit Diagnosis: Muscle weakness (generalized)  Other abnormalities of gait and mobility  Abnormal posture  Unsteadiness on feet  Stiffness of right hip, not elsewhere classified     Problem List Patient Active Problem List   Diagnosis Date Noted  .  Healthcare maintenance 08/15/2017  . Nausea and vomiting 06/08/2017  . Erectile dysfunction 04/22/2017  . Nocturnal enuresis  04/19/2017  . Blood in stool 03/07/2017  . History of nausea and vomiting 10/31/2016  . Prosthesis adjustment 08/17/2016  . History of pulmonary embolus (PE) 06/09/2016  . Normocytic anemia 01/30/2014  . Constipation   . Above knee amputation of right lower extremity ([place]) 12/26/2013  . GERD (gastroesophageal reflux disease) 08/14/2013  . Hyperlipidemia 02/19/2013  . S/P cardiac cath, 02/18/13, normal coronaries 02/19/2013  . CKD (chronic kidney disease) stage 3, GFR 30-59 ml/min (HCC) 02/19/2013  . Nephrotic syndrome 02/12/2012  . Hypertension 07/24/2011  . Long term current use of anticoagulant therapy 07/19/2011  . History of DVT (deep vein thrombosis) 07/28/2009  . PROTEINURIA, MILD 02/01/2007  . Morbid obesity ([place]) 10/26/2005  . Type II diabetes mellitus ([place]) 01/09/2002    Addley Ballinger PT, DPT 11/20/2017, 8:29 AM  [facility] [address] [place], [place], [address] Phone: [phone]   Fax:  [phone]  Name: Koah Chisenhall MRN: [id number] Date of Birth: Dec 26, 1975

## 2017-11-22 ENCOUNTER — Ambulatory Visit: Payer: Medicare Other | Admitting: Physical Therapy

## 2017-11-22 ENCOUNTER — Encounter: Payer: Self-pay | Admitting: Physical Therapy

## 2017-11-22 DIAGNOSIS — R2681 Unsteadiness on feet: Secondary | ICD-10-CM | POA: Diagnosis not present

## 2017-11-22 DIAGNOSIS — M6281 Muscle weakness (generalized): Secondary | ICD-10-CM | POA: Diagnosis not present

## 2017-11-22 DIAGNOSIS — M25651 Stiffness of right hip, not elsewhere classified: Secondary | ICD-10-CM | POA: Diagnosis not present

## 2017-11-22 DIAGNOSIS — R2689 Other abnormalities of gait and mobility: Secondary | ICD-10-CM | POA: Diagnosis not present

## 2017-11-22 DIAGNOSIS — R293 Abnormal posture: Secondary | ICD-10-CM | POA: Diagnosis not present

## 2017-11-22 NOTE — Therapy (Signed)
[facility] [address] [place], [place], [address] Phone: [phone]   Fax:  [phone]  Physical Therapy Treatment  Patient Details  Name: [name] MRN: [id number] Date of Birth: 1975-06-27 Referring Provider (PT): [name], [place]   Encounter Date: 11/22/2017  PT End of Session - 11/21/17 1202    Visit Number  7    Number of Visits  25    Date for PT Re-Evaluation  01/01/18    Authorization Type  Medicare & Medicaid    PT Start Time  0800    PT Stop Time  0845    PT Time Calculation (min)  45 min    Activity Tolerance  Patient tolerated treatment well    Behavior During Therapy  [facility] for tasks assessed/performed       Past Medical History:  Diagnosis Date  . Acute venous embolism and thrombosis of deep vessels of proximal lower extremity ([place]) 07/19/2011  . Anemia   . Chest pain, neg MI, normal coronaries by cath 02/18/2013  . CKD (chronic kidney disease) stage 3, GFR 30-59 ml/min (HCC) 02/19/2013  . Colon polyps   . Diabetic ulcer of right foot ([place])   . DVT (deep venous thrombosis) ([place]) 09/2002   patient reports additional DVTs in '06 & '11 (unconfirmed)  . GERD (gastroesophageal reflux disease)   . History of blood transfusion    "related to OR" (10/31/2016)  . Hyperlipidemia 02/19/2013  . Hypertension   . Nausea & vomiting    "constant for the last couple weeks" (10/31/2016)  . Nephrotic syndrome   . Obesity    BMI 44, weight 346 pounds 01/30/14  . Pulmonary embolism ([place]) 09/2002   treated with 6 months of warfarin  . Type I diabetes mellitus ([place]) dx'd 2001    Past Surgical History:  Procedure Laterality Date  . AMPUTATION Right 12/22/2013   Procedure: AMPUTATION BELOW KNEE;  Surgeon: [name], MD;  Location: [facility];  Service: Orthopedics;  Laterality: Right;  . AMPUTATION Right 02/19/2014   Procedure: RIGHT ABOVE KNEE AMPUTATION ;  Surgeon: [name], MD;  Location: [facility];   Service: Orthopedics;  Laterality: Right;  . CARDIAC CATHETERIZATION  02/18/2013   normal coronaries  . ESOPHAGOGASTRODUODENOSCOPY N/A 11/02/2016   Procedure: ESOPHAGOGASTRODUODENOSCOPY (EGD);  Surgeon: [name], MD;  Location: [facility] ENDOSCOPY;  Service: Endoscopy;  Laterality: N/A;  . I&D EXTREMITY Right 12/14/2013   Procedure: IRRIGATION AND DEBRIDEMENT RIGHT FOOT;  Surgeon: [name], MD;  Location: [facility];  Service: Orthopedics;  Laterality: Right;  . INCISION AND DRAINAGE ABSCESS  2007; 2015   "back"  . INCISION AND DRAINAGE OF WOUND Right 12/22/2013   Procedure: I&D RIGHT BUTTOCK;  Surgeon: [name], MD;  Location: [facility];  Service: Orthopedics;  Laterality: Right;  . LEFT HEART CATHETERIZATION WITH CORONARY ANGIOGRAM N/A 02/18/2013   Procedure: LEFT HEART CATHETERIZATION WITH CORONARY ANGIOGRAM;  Surgeon: [name], MD;  Location: [facility] CATH LAB;  Service: Cardiovascular;  Laterality: N/A;    There were no vitals filed for this visit.  Subjective Assessment - 11/22/17 0758    Subjective  He check blood glucose when he got home Tuesday after not feeling well at PT. He drank a PowerAde before leaving PT office and 1 hour later his blood glucose was 74.     Pertinent History  R TFA, DM1, morbid obesity, CKD3, HTN, DVT/PE,     Limitations  Lifting;Standing;Walking;House hold activities  Patient Stated Goals  to walk better with prosthesis, build standing tolerance & endurance    Currently in Pain?  No/denies      Patient went to gym for intense workout this morning prior to today's session. He reports eating breakfast this morning.  PT demo prosthetic gait with RW with sequencing step prosthesis, "power up" into upright posture, advance RW to back of walker even with prosthetic toes, step LLE;  "power up" advance RW to LLE toes then step prosthesis...  Pt ambulated 25' & 35' with PT cues to really focus on "power up" / upright posture.  Pt fatigued quickly. PT reviewed  recommendations to limit workouts on mornings of PT as his decreased activity tolerance is effecting performance in PT.  See pt education. PT repeatedly questioned if patient was experiencing low blood glucose but he denied for last 15 minutes. Then at end of session, he reported felt like low blood glucose. So drank a powerade and eat crackers.                          PT Education - 11/22/17 0840    Education Details  PT reviewed benefits to function with weight loss and consideration for bariatric surgery. PT has an individual with [facility] who had bariatric surgery who could do peer visit if patient is interested.     Person(s) Educated  Patient    Methods  Explanation;Verbal cues    Comprehension  Verbalized understanding;Verbal cues required       PT Short Term Goals - 11/22/17 1203      PT SHORT TERM GOAL #1   Title  Patient verbalizes & demonstrates understanding of updated HEP. (All STGs Target Date: 11/272019)    Time  1    Period  Months    Status  On-going    Target Date  12/05/17      PT SHORT TERM GOAL #2   Title  Patient tolerates standing activities for 7 minutes with RW support.     Time  1    Period  Months    Status  On-going    Target Date  12/05/17      PT SHORT TERM GOAL #3   Title  6-Minute Walk Test with RW & prosthesis >220'     Time  1    Period  Months    Status  On-going    Target Date  12/05/17        PT Long Term Goals - 11/13/17 0914      PT LONG TERM GOAL #1   Title  Patient demonstrates & verbalizes understanding of ongoing fitness plan. (All LTGs Target Date: 12/28/2017)    Time  3    Period  Months    Status  On-going    Target Date  12/28/17      PT LONG TERM GOAL #2   Title  Patient tolerates standing for 10 minutes with intermittent UE support performing standing ADLs with RW.     Time  3    Period  Months    Status  On-going    Target Date  12/28/17      PT LONG TERM GOAL #3   Title  Berg Balance tasks  with walker support >/= 50/56.     Time  3    Period  Months    Status  On-going    Target Date  12/28/17      PT  LONG TERM GOAL #4   Title  6-Minute Walk Test with rolling walker & prosthesis >250'.     Time  3    Period  Months    Status  On-going    Target Date  12/28/17      PT LONG TERM GOAL #5   Title  Patient negotiates ramp, curb with RW & stairs with 2 rails with transfemoral prosthesis modified independent to enable community access.      Time  3    Period  Months    Status  On-going    Target Date  12/28/17            Plan - 11/22/17 1205    Clinical Impression Statement  Patient had another diabetic reaction during PT today. So today's skilled session focused on patient education including blood glucose management, activity levels and also consideration for bariatric surgery. PT instructed in upright posture between each step but patient inconsistent especially with stance on prosthesis.     Rehab Potential  Good    PT Frequency  2x / week    PT Duration  12 weeks    PT Treatment/Interventions  ADLs/Self Care Home Management;Canalith Repostioning;DME Instruction;Gait training;Stair training;Functional mobility training;Therapeutic activities;Therapeutic exercise;Balance training;Neuromuscular re-education;Patient/family education;Prosthetic Training;Manual techniques;Vestibular    PT Next Visit Plan  Prosthetic gait & balance working on form & decreasing UE support. Pt requesting to work on treadmill technique as he uses one at his gym. standing balance & strength, endurance exercises    Consulted and Agree with Plan of Care  Patient       Patient will benefit from skilled therapeutic intervention in order to improve the following deficits and impairments:  Abnormal gait, Decreased activity tolerance, Decreased balance, Decreased endurance, Decreased knowledge of precautions, Decreased knowledge of use of DME, Decreased mobility, Decreased range of motion, Decreased  strength, Dizziness, Impaired flexibility, Postural dysfunction, Prosthetic Dependency, Obesity  Visit Diagnosis: Muscle weakness (generalized)  Other abnormalities of gait and mobility  Abnormal posture  Unsteadiness on feet     Problem List Patient Active Problem List   Diagnosis Date Noted  . Healthcare maintenance 08/15/2017  . Nausea and vomiting 06/08/2017  . Erectile dysfunction 04/22/2017  . Nocturnal enuresis 04/19/2017  . Blood in stool 03/07/2017  . History of nausea and vomiting 10/31/2016  . Prosthesis adjustment 08/17/2016  . History of pulmonary embolus (PE) 06/09/2016  . Normocytic anemia 01/30/2014  . Constipation   . Above knee amputation of right lower extremity ([place]) 12/26/2013  . GERD (gastroesophageal reflux disease) 08/14/2013  . Hyperlipidemia 02/19/2013  . S/P cardiac cath, 02/18/13, normal coronaries 02/19/2013  . CKD (chronic kidney disease) stage 3, GFR 30-59 ml/min (HCC) 02/19/2013  . Nephrotic syndrome 02/12/2012  . Hypertension 07/24/2011  . Long term current use of anticoagulant therapy 07/19/2011  . History of DVT (deep vein thrombosis) 07/28/2009  . PROTEINURIA, MILD 02/01/2007  . Morbid obesity ([place]) 10/26/2005  . Type II diabetes mellitus ([place]) 01/09/2002    Layni Kreamer PT, DPT 11/22/2017, 12:08 PM  [facility] [address] [place], [place], [address] Phone: [phone]   Fax:  [phone]  Name: Dakotah Orrego MRN: [id number] Date of Birth: [birth year]

## 2017-11-27 ENCOUNTER — Ambulatory Visit: Payer: Medicare Other | Admitting: Physical Therapy

## 2017-11-29 ENCOUNTER — Encounter: Payer: Self-pay | Admitting: Physical Therapy

## 2017-11-29 ENCOUNTER — Ambulatory Visit: Payer: Medicare Other | Admitting: Physical Therapy

## 2017-11-29 DIAGNOSIS — R2681 Unsteadiness on feet: Secondary | ICD-10-CM

## 2017-11-29 DIAGNOSIS — M25651 Stiffness of right hip, not elsewhere classified: Secondary | ICD-10-CM | POA: Diagnosis not present

## 2017-11-29 DIAGNOSIS — R2689 Other abnormalities of gait and mobility: Secondary | ICD-10-CM | POA: Diagnosis not present

## 2017-11-29 DIAGNOSIS — M6281 Muscle weakness (generalized): Secondary | ICD-10-CM | POA: Diagnosis not present

## 2017-11-29 DIAGNOSIS — R293 Abnormal posture: Secondary | ICD-10-CM | POA: Diagnosis not present

## 2017-11-29 NOTE — Therapy (Signed)
[facility] [name] [place], [place], [address] Phone: [phone]   Fax:  [phone]  Physical Therapy Treatment  Patient Details  Name: [name] MRN: [id number] Date of Birth: 1975-10-08 Referring Provider (PT): [name], [place]   Encounter Date: 11/29/2017  PT End of Session - 11/29/17 1232    Visit Number  8    Number of Visits  25    Date for PT Re-Evaluation  01/01/18    Authorization Type  Medicare & Medicaid    PT Start Time  0800    PT Stop Time  0845    PT Time Calculation (min)  45 min    Activity Tolerance  Patient tolerated treatment well    Behavior During Therapy  [facility] for tasks assessed/performed       Past Medical History:  Diagnosis Date  . Acute venous embolism and thrombosis of deep vessels of proximal lower extremity ([place]) 07/19/2011  . Anemia   . Chest pain, neg MI, normal coronaries by cath 02/18/2013  . CKD (chronic kidney disease) stage 3, GFR 30-59 ml/min (HCC) 02/19/2013  . Colon polyps   . Diabetic ulcer of right foot ([place])   . DVT (deep venous thrombosis) ([place]) 09/2002   patient reports additional DVTs in '06 & '11 (unconfirmed)  . GERD (gastroesophageal reflux disease)   . History of blood transfusion    "related to OR" (10/31/2016)  . Hyperlipidemia 02/19/2013  . Hypertension   . Nausea & vomiting    "constant for the last couple weeks" (10/31/2016)  . Nephrotic syndrome   . Obesity    BMI 44, weight 346 pounds 01/30/14  . Pulmonary embolism ([place]) 09/2002   treated with 6 months of warfarin  . Type I diabetes mellitus ([place]) dx'd 2001    Past Surgical History:  Procedure Laterality Date  . AMPUTATION Right 12/22/2013   Procedure: AMPUTATION BELOW KNEE;  Surgeon: [name], MD;  Location: [facility];  Service: Orthopedics;  Laterality: Right;  . AMPUTATION Right 02/19/2014   Procedure: RIGHT ABOVE KNEE AMPUTATION ;  Surgeon: [name], MD;  Location: [facility];   Service: Orthopedics;  Laterality: Right;  . CARDIAC CATHETERIZATION  02/18/2013   normal coronaries  . ESOPHAGOGASTRODUODENOSCOPY N/A 11/02/2016   Procedure: ESOPHAGOGASTRODUODENOSCOPY (EGD);  Surgeon: [name], MD;  Location: [facility] ENDOSCOPY;  Service: Endoscopy;  Laterality: N/A;  . I&D EXTREMITY Right 12/14/2013   Procedure: IRRIGATION AND DEBRIDEMENT RIGHT FOOT;  Surgeon: [name], MD;  Location: [place];  Service: Orthopedics;  Laterality: Right;  . INCISION AND DRAINAGE ABSCESS  2007; 2015   "back"  . INCISION AND DRAINAGE OF WOUND Right 12/22/2013   Procedure: I&D RIGHT BUTTOCK;  Surgeon: [name], MD;  Location: [facility];  Service: Orthopedics;  Laterality: Right;  . LEFT HEART CATHETERIZATION WITH CORONARY ANGIOGRAM N/A 02/18/2013   Procedure: LEFT HEART CATHETERIZATION WITH CORONARY ANGIOGRAM;  Surgeon: [name], MD;  Location: [facility] CATH LAB;  Service: Cardiovascular;  Laterality: N/A;    There were no vitals filed for this visit.  Subjective Assessment - 11/29/17 0757    Subjective  He did work out at gym this morning with [employer] lowered intensity today & reduced warmup to 2 min on Treadmill. He missed Tuesday PT appt due to SCAT issue.     Pertinent History  R TFA, DM1, morbid obesity, CKD3, HTN, DVT/PE,     Limitations  Lifting;Standing;Walking;House hold activities    Patient Stated  Goals  to walk better with prosthesis, build standing tolerance & endurance    Currently in Pain?  No/denies                       [facility] Adult PT Treatment/Exercise - 11/29/17 0800      Transfers   Transfers  Sit to Stand;Stand to Sit    Sit to Stand  5: Supervision;With upper extremity assist;From chair/3-in-1   to RW that PT stabilizes   Stand to Sit  5: Supervision;With upper extremity assist;To chair/3-in-1;Uncontrolled descent      Ambulation/Gait   Ambulation/Gait  Yes    Ambulation/Gait Assistance  5: Supervision    Ambulation/Gait Assistance Details   verbal cues on upright posture prior to advancing either LE with goal to decrease UE weight bearing on RW.  His residual limb did not seat into socket full even with 0-ply fit so prosthesis was "long" decreasing clearance in swing & pelvic rise in stance.     Ambulation Distance (Feet)  50 Feet   30' & 38' pt reported socket discomfort with fit today   Assistive device  Rolling walker;Prosthesis    Ambulation Surface  Level;Indoor             PT Education - 11/29/17 0845    Education Details  planks with prosthesis: supine, prone & sidelying with Yoga block at prosthetic knee;  bridges with Yoga block under pelvis for increased ROM    Person(s) Educated  Patient    Methods  Explanation;Demonstration;Tactile cues;Verbal cues    Comprehension  Verbalized understanding;Returned demonstration;Verbal cues required;Tactile cues required       PT Short Term Goals - 11/22/17 1203      PT SHORT TERM GOAL #1   Title  Patient verbalizes & demonstrates understanding of updated HEP. (All STGs Target Date: 11/272019)    Time  1    Period  Months    Status  On-going    Target Date  12/05/17      PT SHORT TERM GOAL #2   Title  Patient tolerates standing activities for 7 minutes with RW support.     Time  1    Period  Months    Status  On-going    Target Date  12/05/17      PT SHORT TERM GOAL #3   Title  6-Minute Walk Test with RW & prosthesis >220'     Time  1    Period  Months    Status  On-going    Target Date  12/05/17        PT Long Term Goals - 11/13/17 0914      PT LONG TERM GOAL #1   Title  Patient demonstrates & verbalizes understanding of ongoing fitness plan. (All LTGs Target Date: 12/28/2017)    Time  3    Period  Months    Status  On-going    Target Date  12/28/17      PT LONG TERM GOAL #2   Title  Patient tolerates standing for 10 minutes with intermittent UE support performing standing ADLs with RW.     Time  3    Period  Months    Status  On-going     Target Date  12/28/17      PT LONG TERM GOAL #3   Title  Berg Balance tasks with walker support >/= 50/56.     Time  3    Period  Months  Status  On-going    Target Date  12/28/17      PT LONG TERM GOAL #4   Title  6-Minute Walk Test with rolling walker & prosthesis >250'.     Time  3    Period  Months    Status  On-going    Target Date  12/28/17      PT LONG TERM GOAL #5   Title  Patient negotiates ramp, curb with RW & stairs with 2 rails with transfemoral prosthesis modified independent to enable community access.      Time  3    Period  Months    Status  On-going    Target Date  12/28/17            Plan - 11/29/17 1232    Clinical Impression Statement  Patient's limb did not fully seat into socket so gait was limited. Patient reports no increase in salt intake for increased fluid retention. Pt seems to understand planks with prosthesis for increased right hip strenth/control.     Rehab Potential  Good    PT Frequency  2x / week    PT Duration  12 weeks    PT Treatment/Interventions  ADLs/Self Care Home Management;Canalith Repostioning;DME Instruction;Gait training;Stair training;Functional mobility training;Therapeutic activities;Therapeutic exercise;Balance training;Neuromuscular re-education;Patient/family education;Prosthetic Training;Manual techniques;Vestibular    PT Next Visit Plan  assess STGs. Prosthetic gait & balance working on form & decreasing UE support. Pt requesting to work on treadmill technique as he uses one at his gym. standing balance & strength, endurance exercises    Consulted and Agree with Plan of Care  Patient       Patient will benefit from skilled therapeutic intervention in order to improve the following deficits and impairments:  Abnormal gait, Decreased activity tolerance, Decreased balance, Decreased endurance, Decreased knowledge of precautions, Decreased knowledge of use of DME, Decreased mobility, Decreased range of motion, Decreased  strength, Dizziness, Impaired flexibility, Postural dysfunction, Prosthetic Dependency, Obesity  Visit Diagnosis: Muscle weakness (generalized)  Other abnormalities of gait and mobility  Abnormal posture  Unsteadiness on feet     Problem List Patient Active Problem List   Diagnosis Date Noted  . Healthcare maintenance 08/15/2017  . Nausea and vomiting 06/08/2017  . Erectile dysfunction 04/22/2017  . Nocturnal enuresis 04/19/2017  . Blood in stool 03/07/2017  . History of nausea and vomiting 10/31/2016  . Prosthesis adjustment 08/17/2016  . History of pulmonary embolus (PE) 06/09/2016  . Normocytic anemia 01/30/2014  . Constipation   . Above knee amputation of right lower extremity ([place]) 12/26/2013  . GERD (gastroesophageal reflux disease) 08/14/2013  . Hyperlipidemia 02/19/2013  . S/P cardiac cath, 02/18/13, normal coronaries 02/19/2013  . CKD (chronic kidney disease) stage 3, GFR 30-59 ml/min (HCC) 02/19/2013  . Nephrotic syndrome 02/12/2012  . Hypertension 07/24/2011  . Long term current use of anticoagulant therapy 07/19/2011  . History of DVT (deep vein thrombosis) 07/28/2009  . PROTEINURIA, MILD 02/01/2007  . Morbid obesity ([place]) 10/26/2005  . Type II diabetes mellitus ([place]) 01/09/2002    Ardys Hataway PT, DPT 11/29/2017, 12:35 PM  [facility] [address] [place], [place], [address] Phone: [phone]   Fax:  [phone]  Name: [name] MRN: [id number] Date of Birth: 15-Aug-1975

## 2017-12-03 NOTE — Progress Notes (Signed)
Patient ID: [name], male   DOB: 1975-10-21, 42 y.o.   MRN: [id number]          Reason for Appointment: Follow-up for Type 2 Diabetes  Referring physician: Jannette Fogo   History of Present Illness:          Date of diagnosis of type 2 diabetes mellitus:  2001      His weight is about 400 pounds at the time of diagnosis He does not know what his initial blood sugars and circumstances were but he apparently was started on insulin initially He was not able to tolerate metformin because of diarrhea Subsequently has been on insulin only, level of control not available but his A1c was significantly high at 12.8 in 2015  Background history:    Recent history:   INSULIN regimen is:  130 units Tresiba, NovoLog 25 U tid     Non-insulin hypoglycemic drugs the patient is taking are: Victoza 1.2 mg daily  His A1c last is 8.7, much higher than his last reading of 6.2   Current management, blood sugar patterns and problems identified:  He says he has not been able to get test strips because of problems with the prescription at the drugstore sent by his PCP but no records available of recent prescription  All his blood sugars last month were fasting and only a few documented, he is overall high  He thinks the high readings are from poor diet the night before  No recent labs available to assess his recent control  Today he had only a very light meal at lunchtime and his blood sugar is only 72  He does not think he has gained weight since his prosthesis is 20 pounds and he thinks he has more edema and muscle mass  No nausea from Victoza 1.2 mg daily  He is not adjusting his NovoLog based on what he is eating  Also does not reduce it when he is going for exercise in the mornings despite previous instructions and may sometimes feel his sugar getting low subsequently       Side effects from medications have been: Diarrhea with metformin, vomiting with Ozempic  Compliance with the medical  regimen: Variable Hypoglycemia:   As above  Glucose monitoring:  done <1 a day         Glucometer:   ACCU-CHEK [employer] Blood sugar readings from last month  Range 238-395 most readings early morning and one reading at 8 AM overall average 291   Self-care:    Typical meal intake: Breakfast is egg whites, some oatmeal/grits and banana.  Usually small portions of meat or other protein at lunch and dinner and only a small amount of sweet potato at lunch or carbohydrate              Dietician visit, most recent: Unknown               Exercise:  Weights, resistance and some cardio at the gym at least 5 days a week, does 2 sessions of 1 hour each  Weight history:  Wt Readings from Last 3 Encounters:  12/04/17 (!) 410 lb 9.6 oz (186.2 kg)  11/06/17 (!) 375 lb (170.1 kg)  07/30/17 (!) 363 lb (164.7 kg)    Glycemic control:   Lab Results  Component Value Date   HGBA1C 8.7 (H) 09/17/2017   HGBA1C 6.2 06/05/2017   HGBA1C 7.9 02/15/2017   Lab Results  Component Value Date   MICROALBUR 160.61 (H) 08/14/2013  [place] 91 06/05/2017   CREATININE 2.29 (H) 09/17/2017   Lab Results  Component Value Date   MICRALBCREAT 818.2 (H) 08/14/2013    No results found for: FRUCTOSAMINE    Allergies as of 12/04/2017      Reactions   Reglan [metoclopramide] Other (See Comments)   Dysphoric reaction      Medication List        Accurate as of 12/04/17  8:44 PM. Always use your most recent med list.          accu-chek softclix lancets Use to check blood sugars 5 times a day. Dx code:E11.9. Insulin dependent.   ACCU-CHEK SOFTCLIX LANCETS lancets USE TO CHECK BLOOD SUGAR 5 TIMES DAILY. DX CODE E11.9.   allopurinol 100 MG tablet Commonly known as:  ZYLOPRIM TAKE 2 TABLETS BY MOUTH ONCE DAILY   Alpha-Lipoic Acid 200 MG Caps Take 1 capsule (200 mg total) by mouth daily.   amLODipine 5 MG tablet Commonly known as:  NORVASC TAKE 1 TABLET BY MOUTH ONCE DAILY   apixaban 5 MG Tabs  tablet Commonly known as:  ELIQUIS Take 1 tablet (5 mg total) by mouth 2 (two) times daily.   B-12 2500 MCG Subl Place 2,500 mcg under the tongue daily.   calcitRIOL 0.25 MCG capsule Commonly known as:  ROCALTROL TAKE 1 CAPSULE BY MOUTH ONCE DAILY   carvedilol 25 MG tablet Commonly known as:  COREG TAKE 1 TABLET BY MOUTH TWICE DAILY   cloNIDine 0.3 MG tablet Commonly known as:  CATAPRES Take 1 tablet (0.3 mg total) by mouth 2 (two) times daily.   FISH OIL PO Take 1 capsule by mouth at bedtime.   furosemide 40 MG tablet Commonly known as:  LASIX Take 2 tablets (80 mg total) by mouth 2 (two) times daily.   gabapentin 300 MG capsule Commonly known as:  NEURONTIN TAKE 1 CAPSULE BY MOUTH THREE TIMES DAILY   glucose blood test strip 1 each by Other route 3 (three) times daily. Use as instructed to check blood sugar 3 times daily. DX:E11.65   hydrALAZINE 50 MG tablet Commonly known as:  APRESOLINE Take 50 mg by mouth 4 (four) times daily.   ibuprofen 200 MG tablet Commonly known as:  ADVIL,MOTRIN Take 200 mg by mouth every 6 (six) hours as needed for headache (pain).   insulin aspart 100 UNIT/ML FlexPen Commonly known as:  NOVOLOG Inject 25 Units into the skin 3 (three) times daily.   NOVOLOG FLEXPEN 100 UNIT/ML FlexPen Generic drug:  insulin aspart INJECT 25 UNITS INTO THE SKIN THREE TIMES DAILY   Insulin Degludec 200 UNIT/ML Sopn Inject 140 Units into the skin daily.   loratadine 10 MG tablet Commonly known as:  CLARITIN Take 10 mg by mouth daily as needed (seasonal allergies).   lovastatin 20 MG tablet Commonly known as:  MEVACOR TAKE 1 TABLET BY MOUTH ONCE DAILY   metolazone 2.5 MG tablet Commonly known as:  ZAROXOLYN Take 2.5 mg by mouth every other day.   Pen Needles 3/16" 31G X 5 MM Misc Use five times daily   Insulin Pen Needle 31G X 5 MM Misc USE  4 TIMES DAILY   polyethylene glycol packet Commonly known as:  MIRALAX / GLYCOLAX Take 17 g by  mouth daily as needed for mild constipation.   promethazine 12.5 MG tablet Commonly known as:  PHENERGAN Take 1 tablet (12.5 mg total) by mouth every 8 (eight) hours as needed for nausea or vomiting.   senna-docusate 8.6-50  MG tablet Commonly known as:  Senokot-S Take 2 tablets by mouth daily as needed for mild constipation.   VICTOZA 18 MG/3ML Sopn Generic drug:  liraglutide Inject 0.2 mLs (1.2 mg total) into the skin daily. Inject once daily at the same time   Zinc Gluconate 100 MG Tabs Take 2.2 tablets (220 mg total) by mouth daily.   zinc sulfate 220 (50 Zn) MG capsule Take 220 mg by mouth daily.       Allergies:  Allergies  Allergen Reactions  . Reglan [Metoclopramide] Other (See Comments)    Dysphoric reaction    Past Medical History:  Diagnosis Date  . Acute venous embolism and thrombosis of deep vessels of proximal lower extremity ([place]) 07/19/2011  . Anemia   . Chest pain, neg MI, normal coronaries by cath 02/18/2013  . CKD (chronic kidney disease) stage 3, GFR 30-59 ml/min (HCC) 02/19/2013  . Colon polyps   . Diabetic ulcer of right foot ([place])   . DVT (deep venous thrombosis) ([place]) 09/2002   patient reports additional DVTs in '06 & '11 (unconfirmed)  . GERD (gastroesophageal reflux disease)   . History of blood transfusion    "related to OR" (10/31/2016)  . Hyperlipidemia 02/19/2013  . Hypertension   . Nausea & vomiting    "constant for the last couple weeks" (10/31/2016)  . Nephrotic syndrome   . Obesity    BMI 44, weight 346 pounds 01/30/14  . Pulmonary embolism ([place]) 09/2002   treated with 6 months of warfarin  . Type I diabetes mellitus ([place]) dx'd 2001    Past Surgical History:  Procedure Laterality Date  . AMPUTATION Right 12/22/2013   Procedure: AMPUTATION BELOW KNEE;  Surgeon: [name], MD;  Location: [facility];  Service: Orthopedics;  Laterality: Right;  . AMPUTATION Right 02/19/2014   Procedure: RIGHT ABOVE KNEE AMPUTATION ;  Surgeon: [name],  MD;  Location: [facility];  Service: Orthopedics;  Laterality: Right;  . CARDIAC CATHETERIZATION  02/18/2013   normal coronaries  . ESOPHAGOGASTRODUODENOSCOPY N/A 11/02/2016   Procedure: ESOPHAGOGASTRODUODENOSCOPY (EGD);  Surgeon: [name], MD;  Location[facility] ENDOSCOPY;  Service: Endoscopy;  Laterality: N/A;  . I&D EXTREMITY Right 12/14/2013   Procedure: IRRIGATION AND DEBRIDEMENT RIGHT FOOT;  Surgeon: [name], MD;  Location: [facility];  Service: Orthopedics;  Laterality: Right;  . INCISION AND DRAINAGE ABSCESS  2007; 2015   "back"  . INCISION AND DRAINAGE OF WOUND Right 12/22/2013   Procedure: I&D RIGHT BUTTOCK;  Surgeon: [name], MD;  Location: [facility];  Service: Orthopedics;  Laterality: Right;  . LEFT HEART CATHETERIZATION WITH CORONARY ANGIOGRAM N/A 02/18/2013   Procedure: LEFT HEART CATHETERIZATION WITH CORONARY ANGIOGRAM;  Surgeon: [name], MD;  Location: [facility] CATH LAB;  Service: Cardiovascular;  Laterality: N/A;    Family History  Problem Relation Age of Onset  . Heart disease Mother   . Diabetes Mother   . Diabetes Maternal Uncle   . Diabetes Cousin     Social History:  reports that he has never smoked. He has never used smokeless tobacco. He reports that he drinks alcohol. He reports that he does not use drugs.   Review of Systems    LOW TESTOSTERONE Previously the rehab facility he was in told him that he had a low testosterone and was started on testosterone injections He was taking 50 mg every 2 weeks However he stopped taking the injection because of fear of getting blood clots which he has had several  times.   Reportedly he has had normal prolactin levels and LH but no results are available  Free testosterone level is subsequently normal  Lipid history: On Mevacor with good control as follows:    Lab Results  Component Value Date   CHOL 150 06/05/2017   HDL 33.30 (L) 06/05/2017   LDLCALC 91 06/05/2017   TRIG 127.0 06/05/2017   CHOLHDL 4 06/05/2017            Hypertension: He is taking clonidine, Norvasc and Coreg followed by nephrologist  BP Readings from Last 3 Encounters:  12/04/17 (!) 142/70  09/24/17 (!) 141/80  08/14/17 (!) 105/50     Most recent eye exam was in 08/2017  Most recent foot exam: 8/65   Complications of diabetes: Nephropathy with renal insufficiency and proteinuria, unknown status of retinopathy, erectile dysfunction  Lab Results  Component Value Date   CREATININE 2.29 (H) 09/17/2017   CREATININE 2.16 (H) 06/10/2017   CREATININE 2.05 (H) 06/09/2017     LABS:  Office Visit on 12/04/2017  Component Date Value Ref Range Status  . POC Glucose 12/04/2017 72  70 - 99 mg/dl Final  . Glucose, Bld 12/04/2017 75  70 - 99 mg/dL Final    Physical Examination:  BP (!) 142/70 (BP Location: Left Arm, Patient Position: Sitting, Cuff Size: Large)   Pulse (!) 105   Ht 6\' 2"  (1.88 m)   Wt (!) 410 lb 9.6 oz (186.2 kg)   SpO2 98%   BMI 52.72 kg/m       ASSESSMENT:  Diabetes type 2, with morbid obesity  See history of present illness for detailed discussion of current diabetes management, blood sugar patterns and problems identified  His last A1c was 8.7  Level of control is difficult to assess because of lack of objective information, home glucose monitoring and variable compliance with diet  Discussed need for better management of his diabetes including consistent restriction of calories, high fat foods and adjustment of insulin both mealtime and basal insulin based on blood sugar patterns which are not available  Also not clear if he is benefiting from Victoza at the 1.2 dosage  CKD: Followed by a nephrologist and relatively stable  Obesity: Still has difficulty losing weight  PLAN:    He will need to start checking blood sugars both fasting and after meals, new prescription given for strips  Check fructosamine today to establish current level of control  Need to consistently check fasting  readings to establish dose of Tresiba  Also must check readings after supper also to help adjust his mealtime dose  He can reduce his NovoLog by 10 to 15 units when exercising and also lower doses when eating smaller or low-carb meals  Victoza 1.2 mg +5 clicks for the next week and then 1.8 if no nausea  To call if he has any difficulties with prescriptions  Will review information from nephrologist when available    Patient Instructions  Check blood sugars on waking up days a week  Also check blood sugars about 2 hours after meals and do this after different meals by rotation  Recommended blood sugar levels on waking up are 90-130 and about 2 hours after meal is 130-160  Please bring your blood sugar monitor to each visit, thank you  Less Novolog in am   Counseling time on subjects discussed in assessment and plan sections is over 50% of today's 25 minute visit   [name] 12/04/2017, 8:44 PM  Note: This office note was prepared with Dragon voice recognition system technology. Any transcriptional errors that result from this process are unintentional.  

## 2017-12-04 ENCOUNTER — Ambulatory Visit (INDEPENDENT_AMBULATORY_CARE_PROVIDER_SITE_OTHER): Payer: Medicare Other | Admitting: Endocrinology

## 2017-12-04 ENCOUNTER — Other Ambulatory Visit: Payer: Self-pay

## 2017-12-04 ENCOUNTER — Encounter: Payer: Self-pay | Admitting: Physical Therapy

## 2017-12-04 ENCOUNTER — Encounter: Payer: Self-pay | Admitting: Endocrinology

## 2017-12-04 ENCOUNTER — Ambulatory Visit: Payer: Medicare Other | Admitting: Physical Therapy

## 2017-12-04 VITALS — BP 142/70 | HR 105 | Ht 74.0 in | Wt >= 6400 oz

## 2017-12-04 DIAGNOSIS — R2689 Other abnormalities of gait and mobility: Secondary | ICD-10-CM | POA: Diagnosis not present

## 2017-12-04 DIAGNOSIS — Z794 Long term (current) use of insulin: Secondary | ICD-10-CM

## 2017-12-04 DIAGNOSIS — R2681 Unsteadiness on feet: Secondary | ICD-10-CM

## 2017-12-04 DIAGNOSIS — E1165 Type 2 diabetes mellitus with hyperglycemia: Secondary | ICD-10-CM | POA: Diagnosis not present

## 2017-12-04 DIAGNOSIS — M6281 Muscle weakness (generalized): Secondary | ICD-10-CM

## 2017-12-04 DIAGNOSIS — R293 Abnormal posture: Secondary | ICD-10-CM

## 2017-12-04 DIAGNOSIS — M25651 Stiffness of right hip, not elsewhere classified: Secondary | ICD-10-CM | POA: Diagnosis not present

## 2017-12-04 LAB — GLUCOSE, RANDOM: GLUCOSE: 75 mg/dL (ref 70–99)

## 2017-12-04 LAB — GLUCOSE, POCT (MANUAL RESULT ENTRY): POC Glucose: 72 mg/dl (ref 70–99)

## 2017-12-04 MED ORDER — GLUCOSE BLOOD VI STRP: 1.0000 | ORAL_STRIP | Freq: Three times a day (TID) | 3 refills | Status: DC

## 2017-12-04 NOTE — Patient Instructions (Signed)
For Endurance work Goal is more steady rhymatic motion with walking. Not taking breaks between steps.   6 Minute Walk Test is walking for 6 minutes without stopping. Our goal was 58' and he walked 232'.  Next goal by 12/19 is 250'

## 2017-12-04 NOTE — Patient Instructions (Signed)
Check blood sugars on waking up days a week  Also check blood sugars about 2 hours after meals and do this after different meals by rotation  Recommended blood sugar levels on waking up are 90-130 and about 2 hours after meal is 130-160  Please bring your blood sugar monitor to each visit, thank you  Less Novolog in am

## 2017-12-05 ENCOUNTER — Other Ambulatory Visit: Payer: Self-pay

## 2017-12-05 MED ORDER — GLUCOSE BLOOD VI STRP: 1.0000 | ORAL_STRIP | Freq: Three times a day (TID) | 3 refills | Status: DC

## 2017-12-05 NOTE — Therapy (Signed)
[facility] [address] [place], [place], [address] Phone: [phone]   Fax:  [phone]  Physical Therapy Treatment  Patient Details  Name: [name] MRN: [id number] Date of Birth: [birth year] Referring Provider (PT): [name], [place]   Encounter Date: 12/04/2017  PT End of Session - 12/04/17 1200    Visit Number  9    Number of Visits  25    Date for PT Re-Evaluation  01/01/18    Authorization Type  Medicare & Medicaid    PT Start Time  0755    PT Stop Time  0845    PT Time Calculation (min)  50 min    Activity Tolerance  Patient tolerated treatment well    Behavior During Therapy  [facility] for tasks assessed/performed       Past Medical History:  Diagnosis Date  . Acute venous embolism and thrombosis of deep vessels of proximal lower extremity ([place]) 07/19/2011  . Anemia   . Chest pain, neg MI, normal coronaries by cath 02/18/2013  . CKD (chronic kidney disease) stage 3, GFR 30-59 ml/min (HCC) 02/19/2013  . Colon polyps   . Diabetic ulcer of right foot ([place])   . DVT (deep venous thrombosis) ([place]) 09/2002   patient reports additional DVTs in '06 & '11 (unconfirmed)  . GERD (gastroesophageal reflux disease)   . History of blood transfusion    "related to OR" (10/31/2016)  . Hyperlipidemia 02/19/2013  . Hypertension   . Nausea & vomiting    "constant for the last couple weeks" (10/31/2016)  . Nephrotic syndrome   . Obesity    BMI 44, weight 346 pounds 01/30/14  . Pulmonary embolism ([place]) 09/2002   treated with 6 months of warfarin  . Type I diabetes mellitus ([place]) dx'd 2001    Past Surgical History:  Procedure Laterality Date  . AMPUTATION Right 12/22/2013   Procedure: AMPUTATION BELOW KNEE;  Surgeon: [name], MD;  Location: [place];  Service: Orthopedics;  Laterality: Right;  . AMPUTATION Right 02/19/2014   Procedure: RIGHT ABOVE KNEE AMPUTATION ;  Surgeon: [name], MD;  Location: [place];   Service: Orthopedics;  Laterality: Right;  . CARDIAC CATHETERIZATION  02/18/2013   normal coronaries  . ESOPHAGOGASTRODUODENOSCOPY N/A 11/02/2016   Procedure: ESOPHAGOGASTRODUODENOSCOPY (EGD);  Surgeon: [name], MD;  Location: [facility] ENDOSCOPY;  Service: Endoscopy;  Laterality: N/A;  . I&D EXTREMITY Right 12/14/2013   Procedure: IRRIGATION AND DEBRIDEMENT RIGHT FOOT;  Surgeon: [name], MD;  Location: [place];  Service: Orthopedics;  Laterality: Right;  . INCISION AND DRAINAGE ABSCESS  2007; 2015   "back"  . INCISION AND DRAINAGE OF WOUND Right 12/22/2013   Procedure: I&D RIGHT BUTTOCK;  Surgeon: [name], MD;  Location: [place];  Service: Orthopedics;  Laterality: Right;  . LEFT HEART CATHETERIZATION WITH CORONARY ANGIOGRAM N/A 02/18/2013   Procedure: LEFT HEART CATHETERIZATION WITH CORONARY ANGIOGRAM;  Surgeon: [name], MD;  Location: [facility] CATH LAB;  Service: Cardiovascular;  Laterality: N/A;    There were no vitals filed for this visit.  Subjective Assessment - 12/04/17 0752    Subjective  He spoke to peer visitor yesterday. He is planning to set up consult appointment with Bariatric doctor.     Pertinent History  R TFA, DM1, morbid obesity, CKD3, HTN, DVT/PE,     Limitations  Lifting;Standing;Walking;House hold activities    Patient Stated Goals  to walk better with prosthesis, build standing tolerance & endurance  Currently in Pain?  No/denies         [facility] PT Assessment - 12/04/17 0800      6 Minute Walk- Baseline   6 Minute Walk- Baseline  yes    BP (mmHg)  165/90    HR (bpm)  100    02 Sat (%RA)  98 %      6 Minute walk- Post Test   6 Minute Walk Post Test  yes    BP (mmHg)  177/84    HR (bpm)  123    02 Sat (%RA)  92 %    Modified Borg Scale for Dyspnea  7- Severe shortness of breath or very hard breathing   respiration 32bpm   Perceived Rate of Exertion (Borg)  17- Very hard      6 minute walk test results    Aerobic Endurance Distance Walked  232    had to correct prosthetic rotation   Endurance additional comments  Pt takes 5 minutes or more to "mentally prepare" for tasks      Static Standing Balance   Static Standing - Balance Support  Bilateral upper extremity supported;During functional activity   manages pants, prosthesis & activities within RW   Static Standing - Level of Assistance  6: Modified independent (Device/Increase time)    Static Standing - Comment/# of Minutes  7                   [facility] Adult PT Treatment/Exercise - 12/04/17 0800      Ambulation/Gait   Ambulation/Gait  Yes               PT Short Term Goals - 12/04/17 1200      PT SHORT TERM GOAL #1   Title  Patient verbalizes & demonstrates understanding of updated HEP. (All STGs Target Date: 11/272019)    Baseline  MET 12/04/2017    Time  1    Period  Months    Status  Achieved      PT SHORT TERM GOAL #2   Title  Patient tolerates standing activities for 7 minutes with RW support.     Baseline  MET 12/04/2017    Time  1    Period  Months    Status  Achieved      PT SHORT TERM GOAL #3   Title  6-Minute Walk Test with RW & prosthesis >220'     Baseline  MET 12/04/2017  6 MWT 232'    Time  1    Period  Months    Status  Achieved        PT Long Term Goals - 11/13/17 0914      PT LONG TERM GOAL #1   Title  Patient demonstrates & verbalizes understanding of ongoing fitness plan. (All LTGs Target Date: 12/28/2017)    Time  3    Period  Months    Status  On-going    Target Date  12/28/17      PT LONG TERM GOAL #2   Title  Patient tolerates standing for 10 minutes with intermittent UE support performing standing ADLs with RW.     Time  3    Period  Months    Status  On-going    Target Date  12/28/17      PT LONG TERM GOAL #3   Title  Berg Balance tasks with walker support >/= 50/56.     Time  3    Period  Months    Status  On-going    Target Date  12/28/17      PT LONG TERM GOAL #4   Title  6-Minute Walk Test with  rolling walker & prosthesis >250'.     Time  3    Period  Months    Status  On-going    Target Date  12/28/17      PT LONG TERM GOAL #5   Title  Patient negotiates ramp, curb with RW & stairs with 2 rails with transfemoral prosthesis modified independent to enable community access.      Time  3    Period  Months    Status  On-going    Target Date  12/28/17            Plan - 12/04/17 1200    Clinical Impression Statement  Patient met all 3 STGs set for this 30-day period. He continues to need increased time to prepare for activities. Patient reports he is setting up consultation with Bariatric doctor to consider surgery. Weight with BMI 52.9% (375# 6.2" using Therapist, nutritional) is biggest limiting factor to his mobility / activity tolerance.     Rehab Potential  Good    PT Frequency  2x / week    PT Duration  12 weeks    PT Treatment/Interventions  ADLs/Self Care Home Management;Canalith Repostioning;DME Instruction;Gait training;Stair training;Functional mobility training;Therapeutic activities;Therapeutic exercise;Balance training;Neuromuscular re-education;Patient/family education;Prosthetic Training;Manual techniques;Vestibular    PT Next Visit Plan  work towards [place]. Prosthetic gait & balance working on form & decreasing UE support. Pt requesting to work on treadmill technique as he uses one at his gym. standing balance & strength, endurance exercises    Consulted and Agree with Plan of Care  Patient       Patient will benefit from skilled therapeutic intervention in order to improve the following deficits and impairments:  Abnormal gait, Decreased activity tolerance, Decreased balance, Decreased endurance, Decreased knowledge of precautions, Decreased knowledge of use of DME, Decreased mobility, Decreased range of motion, Decreased strength, Dizziness, Impaired flexibility, Postural dysfunction, Prosthetic Dependency, Obesity  Visit Diagnosis: Muscle weakness  (generalized)  Other abnormalities of gait and mobility  Abnormal posture  Unsteadiness on feet     Problem List Patient Active Problem List   Diagnosis Date Noted  . Healthcare maintenance 08/15/2017  . Nausea and vomiting 06/08/2017  . Erectile dysfunction 04/22/2017  . Nocturnal enuresis 04/19/2017  . Blood in stool 03/07/2017  . History of nausea and vomiting 10/31/2016  . Prosthesis adjustment 08/17/2016  . History of pulmonary embolus (PE) 06/09/2016  . Normocytic anemia 01/30/2014  . Constipation   . Above knee amputation of right lower extremity ([place]) 12/26/2013  . GERD (gastroesophageal reflux disease) 08/14/2013  . Hyperlipidemia 02/19/2013  . S/P cardiac cath, 02/18/13, normal coronaries 02/19/2013  . CKD (chronic kidney disease) stage 3, GFR 30-59 ml/min (HCC) 02/19/2013  . Nephrotic syndrome 02/12/2012  . Hypertension 07/24/2011  . Long term current use of anticoagulant therapy 07/19/2011  . History of DVT (deep vein thrombosis) 07/28/2009  . PROTEINURIA, MILD 02/01/2007  . Morbid obesity ([place]) 10/26/2005  . Type II diabetes mellitus ([place]) 01/09/2002    Bronco Mcgrory PT, DPT 12/05/2017, 6:04 AM  [facility] [address] [place], [place], [address] Phone: [phone]   Fax:  [phone]  Name: Hanzel Pizzo MRN: [id number] Date of Birth: March 02, 1975

## 2017-12-07 LAB — FRUCTOSAMINE: Fructosamine: 263 umol/L (ref 205–285)

## 2017-12-11 ENCOUNTER — Encounter: Payer: Self-pay | Admitting: Physical Therapy

## 2017-12-11 ENCOUNTER — Encounter: Payer: Self-pay | Admitting: Internal Medicine

## 2017-12-11 ENCOUNTER — Ambulatory Visit (INDEPENDENT_AMBULATORY_CARE_PROVIDER_SITE_OTHER): Payer: Medicare Other | Admitting: Internal Medicine

## 2017-12-11 ENCOUNTER — Ambulatory Visit: Payer: Medicare Other | Attending: Internal Medicine | Admitting: Physical Therapy

## 2017-12-11 ENCOUNTER — Other Ambulatory Visit: Payer: Self-pay

## 2017-12-11 DIAGNOSIS — M545 Low back pain, unspecified: Secondary | ICD-10-CM

## 2017-12-11 DIAGNOSIS — M25651 Stiffness of right hip, not elsewhere classified: Secondary | ICD-10-CM | POA: Insufficient documentation

## 2017-12-11 DIAGNOSIS — Z89511 Acquired absence of right leg below knee: Secondary | ICD-10-CM | POA: Diagnosis not present

## 2017-12-11 DIAGNOSIS — M6281 Muscle weakness (generalized): Secondary | ICD-10-CM

## 2017-12-11 DIAGNOSIS — M549 Dorsalgia, unspecified: Secondary | ICD-10-CM | POA: Insufficient documentation

## 2017-12-11 DIAGNOSIS — R2689 Other abnormalities of gait and mobility: Secondary | ICD-10-CM | POA: Insufficient documentation

## 2017-12-11 DIAGNOSIS — R293 Abnormal posture: Secondary | ICD-10-CM

## 2017-12-11 DIAGNOSIS — R2681 Unsteadiness on feet: Secondary | ICD-10-CM

## 2017-12-11 MED ORDER — CYCLOBENZAPRINE HCL 5 MG PO TABS: 5.0000 mg | ORAL_TABLET | Freq: Three times a day (TID) | ORAL | 0 refills | Status: DC | PRN

## 2017-12-11 NOTE — Progress Notes (Signed)
Internal Medicine Clinic Attending  Case discussed with Dr. Hoffman at the time of the visit.  We reviewed the resident's history and exam and pertinent patient test results.  I agree with the assessment, diagnosis, and plan of care documented in the resident's note.  

## 2017-12-11 NOTE — Progress Notes (Signed)
   CC: acute back pain  HPI:  [name] is a 42 y.o. male with history noted below that presents to the acute care clinic for 2 day history of lower left back pain. He describes the pain as sharp and throbbing. He first noticed it when he was doing squats at the gym.  He says the pain is worse with movement. He was at physical therapy this morning and was told to come into the office for evaluation.  He denies radicular pain, weakness, numbness or tingling in his lower extremities.   Past Medical History:  Diagnosis Date  . Acute venous embolism and thrombosis of deep vessels of proximal lower extremity ([place]) 07/19/2011  . Anemia   . Chest pain, neg MI, normal coronaries by cath 02/18/2013  . CKD (chronic kidney disease) stage 3, GFR 30-59 ml/min (HCC) 02/19/2013  . Colon polyps   . Diabetic ulcer of right foot ([place])   . DVT (deep venous thrombosis) ([place]) 09/2002   patient reports additional DVTs in '06 & '11 (unconfirmed)  . GERD (gastroesophageal reflux disease)   . History of blood transfusion    "related to OR" (10/31/2016)  . Hyperlipidemia 02/19/2013  . Hypertension   . Nausea & vomiting    "constant for the last couple weeks" (10/31/2016)  . Nephrotic syndrome   . Obesity    BMI 44, weight 346 pounds 01/30/14  . Pulmonary embolism ([place]) 09/2002   treated with 6 months of warfarin  . Type I diabetes mellitus ([place]) dx'd 2001    Review of Systems:  As noted per HPI  Physical Exam:  Vitals:   12/11/17 1021  BP: (!) 142/70  Pulse: 90  Temp: 98.3 F (36.8 C)  TempSrc: Oral  SpO2: 98%  Height: 6\' 2"  (1.88 m)   Physical Exam  Constitutional: He is well-developed, well-nourished, and in no distress.  Musculoskeletal: He exhibits no edema.  Tenderness to palpation over lower back on left side and paraspinal musculature No flank pain on palpation  Neurological:  5/5 strength of left lower extremity BKA of right lower extremity  Skin: Skin is warm and dry. No rash  noted.     Assessment & Plan:   See encounters tab for problem based medical decision making.    Patient discussed with Dr. Lynnae January

## 2017-12-11 NOTE — Patient Instructions (Addendum)
Mr. Herendeen,  It was a pleasure seeing you today. Please start taking Flexeril and Tylenol for your back pain.  Please call the clinic if your symptoms do not improve in the next 1-2 weeks

## 2017-12-11 NOTE — Assessment & Plan Note (Signed)
Assessment: acute back pain Appears to be musculoskeletal in nature. Denies any red flag symptoms such as radicular pain, weakness, difficulty urinating, numbness or tingling to the lower extremity.  Will treat with conservative management with heating pad, rest and Flexeril. Asked patient to call back if symptoms worsen.  Plan -Flexeril -Heating pad

## 2017-12-11 NOTE — Therapy (Signed)
[facility] [address] [place], [place], [address] Phone: [phone]   Fax:  [phone]  Patient Details  Name: [name] MRN: [id number] Date of Birth: 04-24-1975 Referring Provider:  Kalman Shan Ratlif*  Encounter Date: 12/11/2017  Patient arrived to today's PT session with acute onset of back pain that he reports onset with doing squats yesterday. He is positive for increased pain with sneezing and coughing which is Red Flag for PT to hold care until further evaluation can be done by MD to rule out neurological involvement.  Patient's PCP office does not open until 9:00 and this patient rides [employer] which will need to be contacted to rearrange his transport. Therefore PT called resident on call and Dr. Truman [place] returned page. He spoke to Dr. Heber [place] who wants to see patient in her clinic this morning. PT informed pt & called [employer] for transportation change.  PT demonstrated proper squat modifications with his prosthetic knee type. Pt verbalized understanding.  Plan next session if no issues found on MD evaluation to work with his trainer.   Jamey Reas PT, DPT 12/11/2017, 8:28 AM  [facility] [address] [place], [place], [address] Phone: [phone]   Fax:  [phone]

## 2017-12-13 ENCOUNTER — Ambulatory Visit: Payer: Medicare Other | Admitting: Physical Therapy

## 2017-12-13 DIAGNOSIS — R2681 Unsteadiness on feet: Secondary | ICD-10-CM | POA: Diagnosis not present

## 2017-12-13 DIAGNOSIS — R2689 Other abnormalities of gait and mobility: Secondary | ICD-10-CM | POA: Diagnosis not present

## 2017-12-13 DIAGNOSIS — M25651 Stiffness of right hip, not elsewhere classified: Secondary | ICD-10-CM | POA: Diagnosis not present

## 2017-12-13 DIAGNOSIS — M6281 Muscle weakness (generalized): Secondary | ICD-10-CM | POA: Diagnosis not present

## 2017-12-13 DIAGNOSIS — R293 Abnormal posture: Secondary | ICD-10-CM | POA: Diagnosis not present

## 2017-12-13 NOTE — Therapy (Addendum)
[facility] [address] [place], [place], [address] Phone: [phone]   Fax:  [phone]  Physical Therapy Treatment  Patient Details  Name: [name] MRN: [id number] Date of Birth: [birth year] Referring Provider (PT): [name], DO  Progress Note Reporting Period 10/03/2017 to 12/13/2017  See note below for Objective Data and Assessment of Progress/Goals.       Encounter Date: 12/13/2017  PT End of Session - 12/13/17 1235    Visit Number  10    Number of Visits  25    Date for PT Re-Evaluation  01/01/18    Authorization Type  Medicare & Medicaid    PT Start Time  1010    PT Stop Time  1055    PT Time Calculation (min)  45 min    Activity Tolerance  Patient tolerated treatment well    Behavior During Therapy  WFL for tasks assessed/performed       Past Medical History:  Diagnosis Date  . Acute venous embolism and thrombosis of deep vessels of proximal lower extremity ([place]) 07/19/2011  . Anemia   . Chest pain, neg MI, normal coronaries by cath 02/18/2013  . CKD (chronic kidney disease) stage 3, GFR 30-59 ml/min (HCC) 02/19/2013  . Colon polyps   . Diabetic ulcer of right foot ([place])   . DVT (deep venous thrombosis) ([place]) 09/2002   patient reports additional DVTs in '06 & '11 (unconfirmed)  . GERD (gastroesophageal reflux disease)   . History of blood transfusion    "related to OR" (10/31/2016)  . Hyperlipidemia 02/19/2013  . Hypertension   . Nausea & vomiting    "constant for the last couple weeks" (10/31/2016)  . Nephrotic syndrome   . Obesity    BMI 44, weight 346 pounds 01/30/14  . Pulmonary embolism ([place]) 09/2002   treated with 6 months of warfarin  . Type I diabetes mellitus ([place]) dx'd 2001    Past Surgical History:  Procedure Laterality Date  . AMPUTATION Right 12/22/2013   Procedure: AMPUTATION BELOW KNEE;  Surgeon: [name], MD;  Location: [facility];  Service: Orthopedics;   Laterality: Right;  . AMPUTATION Right 02/19/2014   Procedure: RIGHT ABOVE KNEE AMPUTATION ;  Surgeon: [name], MD;  Location: [place];  Service: Orthopedics;  Laterality: Right;  . CARDIAC CATHETERIZATION  02/18/2013   normal coronaries  . ESOPHAGOGASTRODUODENOSCOPY N/A 11/02/2016   Procedure: ESOPHAGOGASTRODUODENOSCOPY (EGD);  Surgeon: [name], MD;  Location: [facility] ENDOSCOPY;  Service: Endoscopy;  Laterality: N/A;  . I&D EXTREMITY Right 12/14/2013   Procedure: IRRIGATION AND DEBRIDEMENT RIGHT FOOT;  Surgeon: [name], MD;  Location: [place];  Service: Orthopedics;  Laterality: Right;  . INCISION AND DRAINAGE ABSCESS  2007; 2015   "back"  . INCISION AND DRAINAGE OF WOUND Right 12/22/2013   Procedure: I&D RIGHT BUTTOCK;  Surgeon: [name], MD;  Location: [facility];  Service: Orthopedics;  Laterality: Right;  . LEFT HEART CATHETERIZATION WITH CORONARY ANGIOGRAM N/A 02/18/2013   Procedure: LEFT HEART CATHETERIZATION WITH CORONARY ANGIOGRAM;  Surgeon: [name], MD;  Location: [facility] CATH LAB;  Service: Cardiovascular;  Laterality: N/A;    There were no vitals filed for this visit.  Subjective Assessment - 12/13/17 1010    Subjective  He went to MD after last session and she said muscle sprain to his back. She prescribed muscle relaxors and they make him tired. He called & left message with bariatric doctor but no call back  yet. He used treadmill and ski machine. He takes several breaks.    Pertinent History  R TFA, DM1, morbid obesity, CKD3, HTN, DVT/PE,     Limitations  Lifting;Standing;Walking;House hold activities    Patient Stated Goals  to walk better with prosthesis, build standing tolerance & endurance    Currently in Pain?  No/denies    Pain Onset  Yesterday       Cyndee Brightly, trainer from patient's gym was scheduled to attend session today per pt request but he did not show up. Sit to stand w/c no armrests to RW with PT stabilizing RW. PT cued on initial contact with heel  as soon as prosthetic foot swings forward and wt shift over stance limb with upright posture. Pt ambulated 150' with supervision / cues noted.  Standing with RW anterior & counter posterior against pelvis: blue theraband with handles - 10 reps each with upright posture and feet under pelvis - single right & left UE & BUEs - row, overhead press, forward punch and shoulder abduction.  Standing with RW support with mat table behind for safety: modified squat with prosthesis abducted, weight midpoint of feet and squats with LLE flexion & prosthesis stays locked. 10 reps 2 sets.  Patient ambulated 40' with RW with carryover to above gait. PT instructed pt that quality is focus and should improve distance as requires less energy to ambulate. Pt verbalized understanding.                           PT Short Term Goals - 12/04/17 1200      PT SHORT TERM GOAL #1   Title  Patient verbalizes & demonstrates understanding of updated HEP. (All STGs Target Date: 11/272019)    Baseline  MET 12/04/2017    Time  1    Period  Months    Status  Achieved      PT SHORT TERM GOAL #2   Title  Patient tolerates standing activities for 7 minutes with RW support.     Baseline  MET 12/04/2017    Time  1    Period  Months    Status  Achieved      PT SHORT TERM GOAL #3   Title  6-Minute Walk Test with RW & prosthesis >220'     Baseline  MET 12/04/2017  6 MWT 232'    Time  1    Period  Months    Status  Achieved        PT Long Term Goals - 11/13/17 0914      PT LONG TERM GOAL #1   Title  Patient demonstrates & verbalizes understanding of ongoing fitness plan. (All LTGs Target Date: 12/28/2017)    Time  3    Period  Months    Status  On-going    Target Date  12/28/17      PT LONG TERM GOAL #2   Title  Patient tolerates standing for 10 minutes with intermittent UE support performing standing ADLs with RW.     Time  3    Period  Months    Status  On-going    Target Date  12/28/17       PT LONG TERM GOAL #3   Title  Berg Balance tasks with walker support >/= 50/56.     Time  3    Period  Months    Status  On-going    Target Date  12/28/17      PT LONG TERM GOAL #4   Title  6-Minute Walk Test with rolling walker & prosthesis >250'.     Time  3    Period  Months    Status  On-going    Target Date  12/28/17      PT LONG TERM GOAL #5   Title  Patient negotiates ramp, curb with RW & stairs with 2 rails with transfemoral prosthesis modified independent to enable community access.      Time  3    Period  Months    Status  On-going    Target Date  12/28/17            Plan - 12/13/17 1236    Clinical Impression Statement  Patient's gait appeared more fluent with less energy with skilled PT instructions today. He was able to engage core muscles with upright posture with counter support posteriorly. He also improved technique for squat with TFA prosthesis which is how he says he injured his back.     Rehab Potential  Good    PT Frequency  2x / week    PT Duration  12 weeks    PT Treatment/Interventions  ADLs/Self Care Home Management;Canalith Repostioning;DME Instruction;Gait training;Stair training;Functional mobility training;Therapeutic activities;Therapeutic exercise;Balance training;Neuromuscular re-education;Patient/family education;Prosthetic Training;Manual techniques;Vestibular    PT Next Visit Plan  work towards [place]. Prosthetic gait & balance working on form & decreasing UE support.    Consulted and Agree with Plan of Care  Patient       Patient will benefit from skilled therapeutic intervention in order to improve the following deficits and impairments:  Abnormal gait, Decreased activity tolerance, Decreased balance, Decreased endurance, Decreased knowledge of precautions, Decreased knowledge of use of DME, Decreased mobility, Decreased range of motion, Decreased strength, Dizziness, Impaired flexibility, Postural dysfunction, Prosthetic Dependency,  Obesity  Visit Diagnosis: Muscle weakness (generalized)  Other abnormalities of gait and mobility  Abnormal posture  Unsteadiness on feet     Problem List Patient Active Problem List   Diagnosis Date Noted  . Acute back pain 12/11/2017  . Healthcare maintenance 08/15/2017  . Nausea and vomiting 06/08/2017  . Erectile dysfunction 04/22/2017  . Nocturnal enuresis 04/19/2017  . Blood in stool 03/07/2017  . History of nausea and vomiting 10/31/2016  . Prosthesis adjustment 08/17/2016  . History of pulmonary embolus (PE) 06/09/2016  . Normocytic anemia 01/30/2014  . Constipation   . Above knee amputation of right lower extremity ([place]) 12/26/2013  . GERD (gastroesophageal reflux disease) 08/14/2013  . Hyperlipidemia 02/19/2013  . S/P cardiac cath, 02/18/13, normal coronaries 02/19/2013  . CKD (chronic kidney disease) stage 3, GFR 30-59 ml/min (HCC) 02/19/2013  . Nephrotic syndrome 02/12/2012  . Hypertension 07/24/2011  . Long term current use of anticoagulant therapy 07/19/2011  . History of DVT (deep vein thrombosis) 07/28/2009  . PROTEINURIA, MILD 02/01/2007  . Morbid obesity ([place]) 10/26/2005  . Type II diabetes mellitus ([place]) 01/09/2002    Elaine Middleton PT, DPT 12/13/2017, 12:40 PM  [facility] [address] [place], [place], [address] Phone: [phone]   Fax:  [phone]  Name: Morrell Fluke MRN: [id number] Date of Birth: May 19, 1975

## 2017-12-18 ENCOUNTER — Ambulatory Visit: Payer: Medicare Other | Admitting: Physical Therapy

## 2017-12-18 ENCOUNTER — Encounter: Payer: Self-pay | Admitting: Physical Therapy

## 2017-12-18 DIAGNOSIS — R293 Abnormal posture: Secondary | ICD-10-CM

## 2017-12-18 DIAGNOSIS — M6281 Muscle weakness (generalized): Secondary | ICD-10-CM | POA: Diagnosis not present

## 2017-12-18 DIAGNOSIS — R2681 Unsteadiness on feet: Secondary | ICD-10-CM

## 2017-12-18 DIAGNOSIS — M25651 Stiffness of right hip, not elsewhere classified: Secondary | ICD-10-CM | POA: Diagnosis not present

## 2017-12-18 DIAGNOSIS — R2689 Other abnormalities of gait and mobility: Secondary | ICD-10-CM

## 2017-12-18 NOTE — Therapy (Signed)
[facility] [address] [place], [place], [address] Phone: [phone]   Fax:  [phone]  Physical Therapy Treatment  Patient Details  Name: [name] MRN: [id number] Date of Birth: 08/25/1975 Referring Provider (PT): [name], [place]   Encounter Date: 12/18/2017  PT End of Session - 12/18/17 1417    Visit Number  11    Number of Visits  25    Date for PT Re-Evaluation  01/01/18    Authorization Type  Medicare & Medicaid    PT Start Time  0800    PT Stop Time  0845    PT Time Calculation (min)  45 min    Activity Tolerance  Patient tolerated treatment well    Behavior During Therapy  [facility] for tasks assessed/performed       Past Medical History:  Diagnosis Date  . Acute venous embolism and thrombosis of deep vessels of proximal lower extremity ([place]) 07/19/2011  . Anemia   . Chest pain, neg MI, normal coronaries by cath 02/18/2013  . CKD (chronic kidney disease) stage 3, GFR 30-59 ml/min (HCC) 02/19/2013  . Colon polyps   . Diabetic ulcer of right foot ([place])   . DVT (deep venous thrombosis) ([place]) 09/2002   patient reports additional DVTs in '06 & '11 (unconfirmed)  . GERD (gastroesophageal reflux disease)   . History of blood transfusion    "related to OR" (10/31/2016)  . Hyperlipidemia 02/19/2013  . Hypertension   . Nausea & vomiting    "constant for the last couple weeks" (10/31/2016)  . Nephrotic syndrome   . Obesity    BMI 44, weight 346 pounds 01/30/14  . Pulmonary embolism ([place]) 09/2002   treated with 6 months of warfarin  . Type I diabetes mellitus ([place]) dx'd 2001    Past Surgical History:  Procedure Laterality Date  . AMPUTATION Right 12/22/2013   Procedure: AMPUTATION BELOW KNEE;  Surgeon: [name], MD;  Location: [place];  Service: Orthopedics;  Laterality: Right;  . AMPUTATION Right 02/19/2014   Procedure: RIGHT ABOVE KNEE AMPUTATION ;  Surgeon: [name], MD;  Location: [place];   Service: Orthopedics;  Laterality: Right;  . CARDIAC CATHETERIZATION  02/18/2013   normal coronaries  . ESOPHAGOGASTRODUODENOSCOPY N/A 11/02/2016   Procedure: ESOPHAGOGASTRODUODENOSCOPY (EGD);  Surgeon: [name], MD;  Location: [facility] ENDOSCOPY;  Service: Endoscopy;  Laterality: N/A;  . I&D EXTREMITY Right 12/14/2013   Procedure: IRRIGATION AND DEBRIDEMENT RIGHT FOOT;  Surgeon: [name], MD;  Location: [facility];  Service: Orthopedics;  Laterality: Right;  . INCISION AND DRAINAGE ABSCESS  2007; 2015   "back"  . INCISION AND DRAINAGE OF WOUND Right 12/22/2013   Procedure: I&D RIGHT BUTTOCK;  Surgeon: [name], MD;  Location: [place];  Service: Orthopedics;  Laterality: Right;  . LEFT HEART CATHETERIZATION WITH CORONARY ANGIOGRAM N/A 02/18/2013   Procedure: LEFT HEART CATHETERIZATION WITH CORONARY ANGIOGRAM;  Surgeon: [name], MD;  Location: [facility] CATH LAB;  Service: Cardiovascular;  Laterality: N/A;    There were no vitals filed for this visit.  Subjective Assessment - 12/18/17 0805    Subjective  No falls. He has been working on walking on treadmill & RW using techniques PT has been working on.     Pertinent History  R TFA, DM1, morbid obesity, CKD3, HTN, DVT/PE,     Limitations  Lifting;Standing;Walking;House hold activities    Patient Stated Goals  to walk better with prosthesis, build standing tolerance &  endurance    Currently in Pain?  No/denies                       [facility] Adult PT Treatment/Exercise - 12/18/17 0805      Transfers   Transfers  Sit to Stand;Stand to Sit    Sit to Stand  5: Supervision;With upper extremity assist;From chair/3-in-1   to RW that PT stabilizes   Stand to Sit  5: Supervision;With upper extremity assist;To chair/3-in-1;Uncontrolled descent      Ambulation/Gait   Ambulation/Gait  Yes    Ambulation/Gait Assistance  5: Supervision    Ambulation/Gait Assistance Details  cues on posture, initial contact and wt shift over  prosthesis in stance;  initial 120' ambulation with 3 standing rests <30 seconds;  2nd 111' with 5 standing rests progressively increasing to 1 minute last time. Pt reports blood glucose was dropping so session stopped to get Coke & crackers.     Ambulation Distance (Feet)  120 Feet   120' & 111'    Assistive device  Rolling walker;Prosthesis    Ambulation Surface  Indoor;Level    Gait Comments  Treadmill: PT demo ideal technique & safety with use. Pt return demo ambulating 1 min at 1.47mh               PT Short Term Goals - 12/04/17 1200      PT SHORT TERM GOAL #1   Title  Patient verbalizes & demonstrates understanding of updated HEP. (All STGs Target Date: 11/272019)    Baseline  MET 12/04/2017    Time  1    Period  Months    Status  Achieved      PT SHORT TERM GOAL #2   Title  Patient tolerates standing activities for 7 minutes with RW support.     Baseline  MET 12/04/2017    Time  1    Period  Months    Status  Achieved      PT SHORT TERM GOAL #3   Title  6-Minute Walk Test with RW & prosthesis >220'     Baseline  MET 12/04/2017  6 MWT 232'    Time  1    Period  Months    Status  Achieved        PT Long Term Goals - 11/13/17 0914      PT LONG TERM GOAL #1   Title  Patient demonstrates & verbalizes understanding of ongoing fitness plan. (All LTGs Target Date: 12/28/2017)    Time  3    Period  Months    Status  On-going    Target Date  12/28/17      PT LONG TERM GOAL #2   Title  Patient tolerates standing for 10 minutes with intermittent UE support performing standing ADLs with RW.     Time  3    Period  Months    Status  On-going    Target Date  12/28/17      PT LONG TERM GOAL #3   Title  Berg Balance tasks with walker support >/= 50/56.     Time  3    Period  Months    Status  On-going    Target Date  12/28/17      PT LONG TERM GOAL #4   Title  6-Minute Walk Test with rolling walker & prosthesis >250'.     Time  3    Period  Months  Status   On-going    Target Date  12/28/17      PT LONG TERM GOAL #5   Title  Patient negotiates ramp, curb with RW & stairs with 2 rails with transfemoral prosthesis modified independent to enable community access.      Time  3    Period  Months    Status  On-going    Target Date  12/28/17            Plan - 12/18/17 1417    Clinical Impression Statement  Patient's gait continues to improve with skilled instruction in technique to decrease UE support on RW. Patient has drops in blood glucose with exertion.     Rehab Potential  Good    PT Frequency  2x / week    PT Duration  12 weeks    PT Treatment/Interventions  ADLs/Self Care Home Management;Canalith Repostioning;DME Instruction;Gait training;Stair training;Functional mobility training;Therapeutic activities;Therapeutic exercise;Balance training;Neuromuscular re-education;Patient/family education;Prosthetic Training;Manual techniques;Vestibular    PT Next Visit Plan  work towards [place]. Prosthetic gait & balance working on form & decreasing UE support.    Consulted and Agree with Plan of Care  Patient       Patient will benefit from skilled therapeutic intervention in order to improve the following deficits and impairments:  Abnormal gait, Decreased activity tolerance, Decreased balance, Decreased endurance, Decreased knowledge of precautions, Decreased knowledge of use of DME, Decreased mobility, Decreased range of motion, Decreased strength, Dizziness, Impaired flexibility, Postural dysfunction, Prosthetic Dependency, Obesity  Visit Diagnosis: Muscle weakness (generalized)  Other abnormalities of gait and mobility  Abnormal posture  Unsteadiness on feet  Stiffness of right hip, not elsewhere classified     Problem List Patient Active Problem List   Diagnosis Date Noted  . Acute back pain 12/11/2017  . Healthcare maintenance 08/15/2017  . Nausea and vomiting 06/08/2017  . Erectile dysfunction 04/22/2017  . Nocturnal  enuresis 04/19/2017  . Blood in stool 03/07/2017  . History of nausea and vomiting 10/31/2016  . Prosthesis adjustment 08/17/2016  . History of pulmonary embolus (PE) 06/09/2016  . Normocytic anemia 01/30/2014  . Constipation   . Above knee amputation of right lower extremity ([place]) 12/26/2013  . GERD (gastroesophageal reflux disease) 08/14/2013  . Hyperlipidemia 02/19/2013  . S/P cardiac cath, 02/18/13, normal coronaries 02/19/2013  . CKD (chronic kidney disease) stage 3, GFR 30-59 ml/min (HCC) 02/19/2013  . Nephrotic syndrome 02/12/2012  . Hypertension 07/24/2011  . Long term current use of anticoagulant therapy 07/19/2011  . History of DVT (deep vein thrombosis) 07/28/2009  . PROTEINURIA, MILD 02/01/2007  . Morbid obesity ([place]) 10/26/2005  . Type II diabetes mellitus ([place]) 01/09/2002    Kinzey Sheriff PT, DPT 12/18/2017, 2:19 PM  [facility] [address] [place], [place], [address] Phone: [phone]   Fax:  [phone]  Name: [name] MRN: [id number] Date of Birth: 13-Jan-1975

## 2017-12-20 ENCOUNTER — Ambulatory Visit: Payer: Medicare Other | Admitting: Physical Therapy

## 2017-12-24 ENCOUNTER — Other Ambulatory Visit: Payer: Self-pay | Admitting: Endocrinology

## 2017-12-25 ENCOUNTER — Ambulatory Visit: Payer: Medicare Other | Admitting: Physical Therapy

## 2017-12-27 ENCOUNTER — Ambulatory Visit: Payer: Medicare Other | Admitting: Physical Therapy

## 2017-12-27 ENCOUNTER — Encounter: Payer: Self-pay | Admitting: Physical Therapy

## 2017-12-27 DIAGNOSIS — M25651 Stiffness of right hip, not elsewhere classified: Secondary | ICD-10-CM | POA: Diagnosis not present

## 2017-12-27 DIAGNOSIS — R2681 Unsteadiness on feet: Secondary | ICD-10-CM | POA: Diagnosis not present

## 2017-12-27 DIAGNOSIS — M6281 Muscle weakness (generalized): Secondary | ICD-10-CM

## 2017-12-27 DIAGNOSIS — R2689 Other abnormalities of gait and mobility: Secondary | ICD-10-CM

## 2017-12-27 DIAGNOSIS — R293 Abnormal posture: Secondary | ICD-10-CM

## 2017-12-27 NOTE — Therapy (Signed)
[facility] [address] [place], [place], [address] Phone: [phone]   Fax:  [phone]  Physical Therapy Treatment & Discharge Summary  Patient Details  Name: [name] MRN: [id number] Date of Birth: 25-Dec-1975 Referring Provider (PT): [name], DO   Encounter Date: 12/27/2017   PHYSICAL THERAPY DISCHARGE SUMMARY  Visits from Start of Care: 12  Current functional level related to goals / functional outcomes: See below   Remaining deficits: See below   Education / Equipment: HEP & prosthetic gait.  Plan: Patient agrees to discharge.  Patient goals were partially met. Patient is being discharged due to meeting the stated rehab goals.  ?????       PT End of Session - 12/27/17 1025    Visit Number  12    Number of Visits  25    Date for PT Re-Evaluation  01/01/18    Authorization Type  Medicare & Medicaid    PT Start Time  0805    PT Stop Time  0845    PT Time Calculation (min)  40 min    Activity Tolerance  Patient tolerated treatment well    Behavior During Therapy  WFL for tasks assessed/performed       Past Medical History:  Diagnosis Date  . Acute venous embolism and thrombosis of deep vessels of proximal lower extremity ([place]) 07/19/2011  . Anemia   . Chest pain, neg MI, normal coronaries by cath 02/18/2013  . CKD (chronic kidney disease) stage 3, GFR 30-59 ml/min (HCC) 02/19/2013  . Colon polyps   . Diabetic ulcer of right foot ([place])   . DVT (deep venous thrombosis) ([place]) 09/2002   patient reports additional DVTs in '06 & '11 (unconfirmed)  . GERD (gastroesophageal reflux disease)   . History of blood transfusion    "related to OR" (10/31/2016)  . Hyperlipidemia 02/19/2013  . Hypertension   . Nausea & vomiting    "constant for the last couple weeks" (10/31/2016)  . Nephrotic syndrome   . Obesity    BMI 44, weight 346 pounds 01/30/14  . Pulmonary embolism ([place]) 09/2002    treated with 6 months of warfarin  . Type I diabetes mellitus ([place]) dx'd 2001    Past Surgical History:  Procedure Laterality Date  . AMPUTATION Right 12/22/2013   Procedure: AMPUTATION BELOW KNEE;  Surgeon: [name], MD;  Location: [facility];  Service: Orthopedics;  Laterality: Right;  . AMPUTATION Right 02/19/2014   Procedure: RIGHT ABOVE KNEE AMPUTATION ;  Surgeon: [name], MD;  Location: [facility];  Service: Orthopedics;  Laterality: Right;  . CARDIAC CATHETERIZATION  02/18/2013   normal coronaries  . ESOPHAGOGASTRODUODENOSCOPY N/A 11/02/2016   Procedure: ESOPHAGOGASTRODUODENOSCOPY (EGD);  Surgeon: [name], MD;  Location: [facility] ENDOSCOPY;  Service: Endoscopy;  Laterality: N/A;  . I&D EXTREMITY Right 12/14/2013   Procedure: IRRIGATION AND DEBRIDEMENT RIGHT FOOT;  Surgeon: [name], MD;  Location: [facility];  Service: Orthopedics;  Laterality: Right;  . INCISION AND DRAINAGE ABSCESS  2007; 2015   "back"  . INCISION AND DRAINAGE OF WOUND Right 12/22/2013   Procedure: I&D RIGHT BUTTOCK;  Surgeon: [name], MD;  Location: [facility];  Service: Orthopedics;  Laterality: Right;  . LEFT HEART CATHETERIZATION WITH CORONARY ANGIOGRAM N/A 02/18/2013   Procedure: LEFT HEART CATHETERIZATION WITH CORONARY ANGIOGRAM;  Surgeon: [name], MD;  Location: [facility] CATH LAB;  Service: Cardiovascular;  Laterality: N/A;    There were no vitals filed  for this visit.  Subjective Assessment - 12/27/17 0806    Subjective  No falls. He continues to workout.     Pertinent History  R TFA, DM1, morbid obesity, CKD3, HTN, DVT/PE,     Limitations  Lifting;Standing;Walking;House hold activities    Patient Stated Goals  to walk better with prosthesis, build standing tolerance & endurance    Currently in Pain?  No/denies         [facility] PT Assessment - 12/27/17 0805      Transfers   Transfers  Sit to Stand;Stand to Sit    Sit to Stand  6: Modified independent (Device/Increase time);With upper extremity  assist;From chair/3-in-1;Multiple attempts   needs RW stabilized most times   Stand to Sit  6: Modified independent (Device/Increase time);With upper extremity assist;To chair/3-in-1;Uncontrolled descent      Ambulation/Gait   Ambulation/Gait  Yes    Ambulation/Gait Assistance  6: Modified independent (Device/Increase time)    Ambulation Distance (Feet)  275 Feet    Assistive device  Rolling walker;Prosthesis    Ambulation Surface  Indoor;Level    Gait velocity  0.74 ft/sec   Initial Gait Velocity 0.44 ft/sec   Ramp  6: Modified independent (Device)   RW & prosthesis   Curb  6: Modified independent (Device/increase time)   RW & prosthesis     6 Minute Walk- Baseline   HR (bpm)  79    02 Sat (%RA)  97 %      6 Minute walk- Post Test   HR (bpm)  112    02 Sat (%RA)  93 %    Modified Borg Scale for Dyspnea  7- Severe shortness of breath or very hard breathing    Perceived Rate of Exertion (Borg)  17- Very hard      6 minute walk test results    Aerobic Endurance Distance Walked  275   sat for "adjustment" to prosthesis 3 minutes   Endurance additional comments  Pt takes 5 minutes or more to "mentally prepare" for tasks still and he sat during 6 MWT to adjust prosthesis but stayed seated for 3 min which enable recovery. So 6 MWT was not able to be performed to protocol for test.       Merrilee Jansky Balance Test   Sit to Stand  Needs moderate or maximal assist to stand   with RW support = 2   Standing Unsupported  Unable to stand 30 seconds unassisted   with RW support = 4   Sitting with Back Unsupported but Feet Supported on Floor or Stool  Able to sit safely and securely 2 minutes    Stand to Sit  Sits independently, has uncontrolled descent   with RW support = 3   Transfers  Able to transfer safely, definite need of hands    Standing Unsupported with Eyes Closed  Needs help to keep from falling   with RW support = 4   Standing Ubsupported with Feet Together  Needs help to attain  position and unable to hold for 15 seconds   with RW support = 4   From Standing, Reach Forward with Outstretched Arm  Loses balance while trying/requires external support   with RW support = 4   From Standing Position, Pick up Object from Floor  Unable to try/needs assist to keep balance   with RW support = 4   From Standing Position, Turn to Look Behind Over each Shoulder  Needs assist to keep from losing balance  and falling   with RW support = 4   Turn 360 Degrees  Needs assistance while turning   with RW support = 1   Standing Unsupported, Alternately Place Feet on Step/Stool  Needs assistance to keep from falling or unable to try   with RW support = 2   Standing Unsupported, One Foot in [employer] balance while stepping or standing   with RW support = 3   Standing on One Leg  Unable to try or needs assist to prevent fall   with RW support = 4   Total Score  8    Berg comment:  Berg Tasks with RW support =  Initial 41/56 today 46/56                             PT Short Term Goals - 12/04/17 1200      PT SHORT TERM GOAL #1   Title  Patient verbalizes & demonstrates understanding of updated HEP. (All STGs Target Date: 11/272019)    Baseline  MET 12/04/2017    Time  1    Period  Months    Status  Achieved      PT SHORT TERM GOAL #2   Title  Patient tolerates standing activities for 7 minutes with RW support.     Baseline  MET 12/04/2017    Time  1    Period  Months    Status  Achieved      PT SHORT TERM GOAL #3   Title  6-Minute Walk Test with RW & prosthesis >220'     Baseline  MET 12/04/2017  6 MWT 232'    Time  1    Period  Months    Status  Achieved        PT Long Term Goals - 12/27/17 1045      PT LONG TERM GOAL #1   Title  Patient demonstrates & verbalizes understanding of ongoing fitness plan. (All LTGs Target Date: 12/28/2017)    Baseline  MET 12/27/2017    Time  3    Period  Months    Status  Achieved      PT LONG TERM GOAL #2    Title  Patient tolerates standing for 10 minutes with intermittent UE support performing standing ADLs with RW.     Baseline  NOT MET 12/27/2017  Patient fatigues with standing & gait an stands only up to 5 minutes. His morbid obesity is biggest limiting factor.     Time  3    Period  Months    Status  Not Met      PT LONG TERM GOAL #3   Title  Berg Balance tasks with walker support >/= 50/56.     Baseline  NOT MET 12/27/2017  Berg Balance tasks with walker support improved from 41/56 to 46/56    Time  3    Period  Months    Status  Not Met      PT LONG TERM GOAL #4   Title  6-Minute Walk Test with rolling walker & prosthesis >250'.     Baseline  Partially MET 12/27/2017  He ambulated 275' but PT had to stop timer so he could sit to adjust prosthesis which is not according to protocol for test.     Time  3    Period  Months    Status  Partially Met  PT LONG TERM GOAL #5   Title  Patient negotiates ramp, curb with RW & stairs with 2 rails with transfemoral prosthesis modified independent to enable community access.      Baseline  MET 12/27/2017    Time  3    Period  Months    Status  Achieved            Plan - 12/27/17 1049    Clinical Impression Statement  Patient met or partially met 4 of 6 LTGs. He only attended 65 of 25 PT sessions in plan. His greatest limiting factor to mobility with Transfemoral Prosthesis is his morbid obesity. Using [employer] BMI calculator to compensate for amputation places his BMI at 52.9%.  Patient is now receptive to bariatric surgery but having difficult time getting consult appointment with Bariatric surgeon / program.     Rehab Potential  Good    PT Frequency  2x / week    PT Duration  12 weeks    PT Treatment/Interventions  ADLs/Self Care Home Management;Canalith Repostioning;DME Instruction;Gait training;Stair training;Functional mobility training;Therapeutic activities;Therapeutic exercise;Balance training;Neuromuscular  re-education;Patient/family education;Prosthetic Training;Manual techniques;Vestibular    PT Next Visit Plan  discharge PT    Consulted and Agree with Plan of Care  Patient       Patient will benefit from skilled therapeutic intervention in order to improve the following deficits and impairments:  Abnormal gait, Decreased activity tolerance, Decreased balance, Decreased endurance, Decreased knowledge of precautions, Decreased knowledge of use of DME, Decreased mobility, Decreased range of motion, Decreased strength, Dizziness, Impaired flexibility, Postural dysfunction, Prosthetic Dependency, Obesity  Visit Diagnosis: Muscle weakness (generalized)  Other abnormalities of gait and mobility  Abnormal posture  Unsteadiness on feet     Problem List Patient Active Problem List   Diagnosis Date Noted  . Acute back pain 12/11/2017  . Healthcare maintenance 08/15/2017  . Nausea and vomiting 06/08/2017  . Erectile dysfunction 04/22/2017  . Nocturnal enuresis 04/19/2017  . Blood in stool 03/07/2017  . History of nausea and vomiting 10/31/2016  . Prosthesis adjustment 08/17/2016  . History of pulmonary embolus (PE) 06/09/2016  . Normocytic anemia 01/30/2014  . Constipation   . Above knee amputation of right lower extremity ([place]) 12/26/2013  . GERD (gastroesophageal reflux disease) 08/14/2013  . Hyperlipidemia 02/19/2013  . S/P cardiac cath, 02/18/13, normal coronaries 02/19/2013  . CKD (chronic kidney disease) stage 3, GFR 30-59 ml/min (HCC) 02/19/2013  . Nephrotic syndrome 02/12/2012  . Hypertension 07/24/2011  . Long term current use of anticoagulant therapy 07/19/2011  . History of DVT (deep vein thrombosis) 07/28/2009  . PROTEINURIA, MILD 02/01/2007  . Morbid obesity ([place]) 10/26/2005  . Type II diabetes mellitus ([place]) 01/09/2002    Willa Brocks PT, DPT 12/27/2017, 10:53 AM  [facility] [address] [place], [place], [address] Phone: [phone]   Fax:  [phone]  Name: [name] MRN: [id number] Date of Birth: 1975-09-17

## 2018-01-14 ENCOUNTER — Telehealth: Payer: Self-pay | Admitting: *Deleted

## 2018-01-14 ENCOUNTER — Encounter: Payer: Self-pay | Admitting: Internal Medicine

## 2018-01-14 NOTE — Telephone Encounter (Signed)
Pt had called front office about needing another urology referral b/c he had missed his last appt. And sleep study results. Lela called [facility] - appt re-scheduled for Feb 10 @ 1330 PM,arrival @ 1315 PM. Pt called/ informed - stated he will be there. Instructed to call their office if needs to re-scheduled. Also I will let his know he's requesting sleep study result.

## 2018-01-17 ENCOUNTER — Other Ambulatory Visit: Payer: Self-pay

## 2018-01-17 ENCOUNTER — Telehealth: Payer: Self-pay | Admitting: Internal Medicine

## 2018-01-17 ENCOUNTER — Ambulatory Visit (INDEPENDENT_AMBULATORY_CARE_PROVIDER_SITE_OTHER): Payer: Medicare Other | Admitting: Internal Medicine

## 2018-01-17 VITALS — BP 142/78 | HR 95 | Temp 98.5°F | Ht 74.0 in

## 2018-01-17 DIAGNOSIS — E785 Hyperlipidemia, unspecified: Secondary | ICD-10-CM | POA: Diagnosis not present

## 2018-01-17 DIAGNOSIS — I2510 Atherosclerotic heart disease of native coronary artery without angina pectoris: Secondary | ICD-10-CM

## 2018-01-17 DIAGNOSIS — I129 Hypertensive chronic kidney disease with stage 1 through stage 4 chronic kidney disease, or unspecified chronic kidney disease: Secondary | ICD-10-CM

## 2018-01-17 DIAGNOSIS — R32 Unspecified urinary incontinence: Secondary | ICD-10-CM | POA: Diagnosis not present

## 2018-01-17 DIAGNOSIS — E1122 Type 2 diabetes mellitus with diabetic chronic kidney disease: Secondary | ICD-10-CM

## 2018-01-17 DIAGNOSIS — G4733 Obstructive sleep apnea (adult) (pediatric): Secondary | ICD-10-CM | POA: Insufficient documentation

## 2018-01-17 DIAGNOSIS — Z7901 Long term (current) use of anticoagulants: Secondary | ICD-10-CM

## 2018-01-17 DIAGNOSIS — Z955 Presence of coronary angioplasty implant and graft: Secondary | ICD-10-CM

## 2018-01-17 DIAGNOSIS — Z89611 Acquired absence of right leg above knee: Secondary | ICD-10-CM

## 2018-01-17 DIAGNOSIS — Z86718 Personal history of other venous thrombosis and embolism: Secondary | ICD-10-CM | POA: Diagnosis not present

## 2018-01-17 DIAGNOSIS — Z6841 Body Mass Index (BMI) 40.0 and over, adult: Secondary | ICD-10-CM

## 2018-01-17 DIAGNOSIS — N138 Other obstructive and reflux uropathy: Secondary | ICD-10-CM

## 2018-01-17 DIAGNOSIS — N401 Enlarged prostate with lower urinary tract symptoms: Secondary | ICD-10-CM

## 2018-01-17 DIAGNOSIS — R350 Frequency of micturition: Secondary | ICD-10-CM | POA: Diagnosis not present

## 2018-01-17 DIAGNOSIS — N183 Chronic kidney disease, stage 3 (moderate): Secondary | ICD-10-CM | POA: Diagnosis not present

## 2018-01-17 DIAGNOSIS — S78111A Complete traumatic amputation at level between right hip and knee, initial encounter: Secondary | ICD-10-CM

## 2018-01-17 LAB — POCT URINALYSIS DIPSTICK
Bilirubin, UA: NEGATIVE
Glucose, UA: NEGATIVE
Ketones, UA: NEGATIVE
Leukocytes, UA: NEGATIVE
Nitrite, UA: NEGATIVE
Protein, UA: POSITIVE — AB
RBC UA: NEGATIVE
Spec Grav, UA: 1.02 (ref 1.010–1.025)
UROBILINOGEN UA: 0.2 U/dL
pH, UA: 6 (ref 5.0–8.0)

## 2018-01-17 NOTE — Progress Notes (Signed)
   CC: urinary incontience  HPI:  [name] is a 43 y.o. with a PMH of CAD s/p stenting, HTN, HLD, DM, CKD 3, nephrotic syndrome, h/o VTE on chronic Eliquis, morbid obesity presenting to clinic for evaluation of urinary frequency and incontinence.  Patient endorses a couple month h/o of weak urinary stream, feeling of incomplete emptying, dribbling, and enuresis, with acute worsening in the last two day. He now endorses increased urinary frequency with smaller amounts of urine, foaming of the urine, and worsening of the enuresis. He denies hematuria, dysuria; he does endorse constipation that's been ongoing and has noticed blood on his stool only for the last couple of weeks; denies hemorrhoids.  Patient denies chest pain, shortness of breath, change in LE swelling  Please see problem based Assessment and Plan for status of patients chronic conditions.  Past Medical History:  Diagnosis Date  . Acute venous embolism and thrombosis of deep vessels of proximal lower extremity ([place]) 07/19/2011  . Anemia   . Chest pain, neg MI, normal coronaries by cath 02/18/2013  . CKD (chronic kidney disease) stage 3, GFR 30-59 ml/min (HCC) 02/19/2013  . Colon polyps   . Diabetic ulcer of right foot ([place])   . DVT (deep venous thrombosis) ([place]) 09/2002   patient reports additional DVTs in '06 & '11 (unconfirmed)  . GERD (gastroesophageal reflux disease)   . History of blood transfusion    "related to OR" (10/31/2016)  . Hyperlipidemia 02/19/2013  . Hypertension   . Nausea & vomiting    "constant for the last couple weeks" (10/31/2016)  . Nephrotic syndrome   . Obesity    BMI 44, weight 346 pounds 01/30/14  . Pulmonary embolism ([place]) 09/2002   treated with 6 months of warfarin  . Type I diabetes mellitus ([place]) dx'd 2001    Review of Systems:   Per HPI  Physical Exam:  Vitals:   01/17/18 0957  BP: (!) 142/78  Pulse: 95  Temp: 98.5 F (36.9 C)  TempSrc: Oral  SpO2: 98%  Height: 6\' 2"   (1.88 m)   GENERAL- alert, co-operative, appears as stated age, not in any distress. CARDIAC- RRR, no murmurs, rubs or gallops. RESP- Moving equal volumes of air, and clear to auscultation bilaterally, no wheezes or crackles. ABDOMEN- +BS, patient declined palpation for fear of exacerbating gastroparesis Rectal- no external hemorrhoids noted, hard stool in rectal vault (not melenic and w/o BRB noted), no prostatic nodularity or tenderness noted EXTREMITIES- s/p L AKA, pulse 2+ Right PT. 1+ pitting edema to knee with compression stocking on RLE (stable for patient)  Assessment & Plan:   See Encounters Tab for problem based charting.   Patient discussed with Dr. Gerrit Friends, MD Internal Medicine PGY-3

## 2018-01-17 NOTE — Assessment & Plan Note (Addendum)
Patient with symptoms of urinary obstruction with likely overflow incontinence that has acutely worsened in the last two day.   UA not consistent with infection, +protein. AUASS score of 28 (severe), however with his age BPH less likely. Symptoms may be caused by urethral stricture.  Patient also endorsing increased frothiness of his urine. He does have h/o nephrotic syndrome. This is less likely an active issue with the patient's symptoms as he otherwise appears to be euvolemic, endorses compliance with antihypertensives and diuetics.   Plan: --patient's urology appt moved to tomorrow; appreciate their help  --RU/S ordered --f/u Bmet --f/u Urine protein/cr ratio  Addendum: Called patient; he was seen by urology and placed on medication (he will call back with name and instructions). He will return in 1 month to see urology for cystoscopy. Pts Cr increased slightly from previous to 2.5 (b/l 1.9-2.2). Patient in agreement to rtc in 2-3 wks for repeat labs and assessment. Pr/Cr ratio 3.3 so below nephrotic range at this time.

## 2018-01-17 NOTE — Telephone Encounter (Addendum)
Attempted calling the patient about urology appointment but got voicemail. Left message about appt at 9:45 AM tomorrow (1/10) at [facility]. Will attempt to call back again later today.  If he calls clinic back before I reach him, please let him know if he has worsening decrease in urine output (is not able to urinate in over 6hr) and new lower abdominal discomfort, that he should present to the ED as he may need a catheter more urgently.  Addendum: Patient called the clinic back. I discussed the above with him and he stated understanding and would be able to keep the appointment with urology in the morning. He was appreciative of the help and had no further questions.   Alphonzo Grieve, MD IMTS - PGY3

## 2018-01-17 NOTE — Assessment & Plan Note (Signed)
Patient completed polysomnography in Nov 2019, and was found to have mild obstructive sleep apnea. We discussed options of weight loss (patient has already started working on this), oral devices and CPAP for treatment.  Patient prefers trying oral appliances and will continue to work on weight loss.  Plan: --refer to sleep medicine for oral appliance fitting and titration

## 2018-01-18 DIAGNOSIS — R358 Other polyuria: Secondary | ICD-10-CM | POA: Diagnosis not present

## 2018-01-18 DIAGNOSIS — N3944 Nocturnal enuresis: Secondary | ICD-10-CM | POA: Diagnosis not present

## 2018-01-18 LAB — PROTEIN / CREATININE RATIO, URINE
Creatinine, Urine: 77.8 mg/dL
Protein, Ur: 260.9 mg/dL
Protein/Creat Ratio: 3353 mg/g creat — ABNORMAL HIGH (ref 0–200)

## 2018-01-18 LAB — BMP8+ANION GAP
Anion Gap: 17 mmol/L (ref 10.0–18.0)
BUN/Creatinine Ratio: 21 — ABNORMAL HIGH (ref 9–20)
BUN: 52 mg/dL — ABNORMAL HIGH (ref 6–24)
CO2: 23 mmol/L (ref 20–29)
Calcium: 8.9 mg/dL (ref 8.7–10.2)
Chloride: 102 mmol/L (ref 96–106)
Creatinine, Ser: 2.52 mg/dL — ABNORMAL HIGH (ref 0.76–1.27)
GFR calc Af Amer: 35 mL/min/{1.73_m2} — ABNORMAL LOW (ref >59)
GFR calc non Af Amer: 30 mL/min/{1.73_m2} — ABNORMAL LOW (ref >59)
Glucose: 104 mg/dL — ABNORMAL HIGH (ref 65–99)
Potassium: 4.3 mmol/L (ref 3.5–5.2)
Sodium: 142 mmol/L (ref 134–144)

## 2018-01-21 NOTE — Progress Notes (Signed)
Internal Medicine Clinic Attending  Case discussed with Dr. Svalina  at the time of the visit.  We reviewed the resident's history and exam and pertinent patient test results.  I agree with the assessment, diagnosis, and plan of care documented in the resident's note.  

## 2018-01-22 ENCOUNTER — Telehealth: Payer: Self-pay

## 2018-01-22 NOTE — Telephone Encounter (Signed)
Pt is requesting shower bench, per patient please faxed the order to [facility]

## 2018-01-23 NOTE — Telephone Encounter (Signed)
Order for DME shower stool placed. Will be able to use note from visit this month. I will place the printed order form in the med records outbox.   Estill Dooms

## 2018-01-23 NOTE — Assessment & Plan Note (Signed)
Patient with difficulty with transfer due to R AKA, requesting shower chair - DME order placed.

## 2018-01-23 NOTE — Telephone Encounter (Signed)
Will need order for shower bench and F2F notes. Hubbard Hartshorn, RN, BSN

## 2018-01-23 NOTE — Addendum Note (Signed)
Addended by: Alphonzo Grieve on: 01/23/2018 05:03 PM   Modules accepted: Orders

## 2018-01-24 NOTE — Telephone Encounter (Signed)
Community message sent to [place] at [facility] for shower bench. Hubbard Hartshorn, RN, BSN

## 2018-01-25 NOTE — Telephone Encounter (Signed)
Following message received from Jolee Ewing at [facility]:  This patient received a shower bench 07/11/2016 and his insurance will not pay for another one at this time. I have called the patient and explained the above to them and offered that they can private pay for that. The cost is $165.00.   Hubbard Hartshorn, RN, BSN

## 2018-01-29 ENCOUNTER — Other Ambulatory Visit: Payer: Self-pay | Admitting: Endocrinology

## 2018-01-30 DIAGNOSIS — E119 Type 2 diabetes mellitus without complications: Secondary | ICD-10-CM | POA: Diagnosis not present

## 2018-01-30 DIAGNOSIS — N189 Chronic kidney disease, unspecified: Secondary | ICD-10-CM | POA: Diagnosis not present

## 2018-01-30 DIAGNOSIS — K8020 Calculus of gallbladder without cholecystitis without obstruction: Secondary | ICD-10-CM | POA: Diagnosis not present

## 2018-01-30 DIAGNOSIS — N3000 Acute cystitis without hematuria: Secondary | ICD-10-CM | POA: Diagnosis not present

## 2018-01-30 DIAGNOSIS — N183 Chronic kidney disease, stage 3 (moderate): Secondary | ICD-10-CM | POA: Diagnosis not present

## 2018-01-30 DIAGNOSIS — M549 Dorsalgia, unspecified: Secondary | ICD-10-CM | POA: Diagnosis not present

## 2018-01-31 ENCOUNTER — Telehealth: Payer: Self-pay | Admitting: *Deleted

## 2018-01-31 NOTE — Telephone Encounter (Signed)
SPOKE WITH OFFICE / OFFICE TO CONTACT PATIENT TODAY FOR SCHEDULING.

## 2018-02-02 ENCOUNTER — Inpatient Hospital Stay (HOSPITAL_COMMUNITY)
Admission: EM | Admit: 2018-02-02 | Discharge: 2018-02-05 | DRG: 291 | Disposition: A | Discharge status: Home / Self Care | Payer: Medicare Other | Attending: Internal Medicine | Admitting: Internal Medicine

## 2018-02-02 ENCOUNTER — Other Ambulatory Visit: Payer: Self-pay

## 2018-02-02 ENCOUNTER — Emergency Department (HOSPITAL_COMMUNITY): Payer: Medicare Other

## 2018-02-02 ENCOUNTER — Encounter (HOSPITAL_COMMUNITY): Payer: Self-pay | Admitting: Emergency Medicine

## 2018-02-02 DIAGNOSIS — N179 Acute kidney failure, unspecified: Secondary | ICD-10-CM | POA: Diagnosis not present

## 2018-02-02 DIAGNOSIS — B961 Klebsiella pneumoniae [K. pneumoniae] as the cause of diseases classified elsewhere: Secondary | ICD-10-CM | POA: Diagnosis not present

## 2018-02-02 DIAGNOSIS — J9601 Acute respiratory failure with hypoxia: Secondary | ICD-10-CM

## 2018-02-02 DIAGNOSIS — E785 Hyperlipidemia, unspecified: Secondary | ICD-10-CM | POA: Diagnosis not present

## 2018-02-02 DIAGNOSIS — N12 Tubulo-interstitial nephritis, not specified as acute or chronic: Secondary | ICD-10-CM | POA: Diagnosis present

## 2018-02-02 DIAGNOSIS — Z7901 Long term (current) use of anticoagulants: Secondary | ICD-10-CM | POA: Diagnosis not present

## 2018-02-02 DIAGNOSIS — I130 Hypertensive heart and chronic kidney disease with heart failure and stage 1 through stage 4 chronic kidney disease, or unspecified chronic kidney disease: Principal | ICD-10-CM | POA: Diagnosis present

## 2018-02-02 DIAGNOSIS — I10 Essential (primary) hypertension: Secondary | ICD-10-CM | POA: Diagnosis not present

## 2018-02-02 DIAGNOSIS — I5031 Acute diastolic (congestive) heart failure: Secondary | ICD-10-CM | POA: Diagnosis present

## 2018-02-02 DIAGNOSIS — Z6841 Body Mass Index (BMI) 40.0 and over, adult: Secondary | ICD-10-CM

## 2018-02-02 DIAGNOSIS — Z955 Presence of coronary angioplasty implant and graft: Secondary | ICD-10-CM

## 2018-02-02 DIAGNOSIS — Z86711 Personal history of pulmonary embolism: Secondary | ICD-10-CM

## 2018-02-02 DIAGNOSIS — E1122 Type 2 diabetes mellitus with diabetic chronic kidney disease: Secondary | ICD-10-CM | POA: Diagnosis present

## 2018-02-02 DIAGNOSIS — K219 Gastro-esophageal reflux disease without esophagitis: Secondary | ICD-10-CM | POA: Diagnosis present

## 2018-02-02 DIAGNOSIS — Z87440 Personal history of urinary (tract) infections: Secondary | ICD-10-CM | POA: Diagnosis not present

## 2018-02-02 DIAGNOSIS — Z86718 Personal history of other venous thrombosis and embolism: Secondary | ICD-10-CM

## 2018-02-02 DIAGNOSIS — E8770 Fluid overload, unspecified: Secondary | ICD-10-CM | POA: Diagnosis not present

## 2018-02-02 DIAGNOSIS — N183 Chronic kidney disease, stage 3 unspecified: Secondary | ICD-10-CM

## 2018-02-02 DIAGNOSIS — N390 Urinary tract infection, site not specified: Secondary | ICD-10-CM | POA: Diagnosis present

## 2018-02-02 DIAGNOSIS — Z91138 Patient's unintentional underdosing of medication regimen for other reason: Secondary | ICD-10-CM | POA: Diagnosis not present

## 2018-02-02 DIAGNOSIS — G4733 Obstructive sleep apnea (adult) (pediatric): Secondary | ICD-10-CM | POA: Diagnosis not present

## 2018-02-02 DIAGNOSIS — Z794 Long term (current) use of insulin: Secondary | ICD-10-CM

## 2018-02-02 DIAGNOSIS — T50916A Underdosing of multiple unspecified drugs, medicaments and biological substances, initial encounter: Secondary | ICD-10-CM | POA: Diagnosis present

## 2018-02-02 DIAGNOSIS — I5032 Chronic diastolic (congestive) heart failure: Secondary | ICD-10-CM

## 2018-02-02 DIAGNOSIS — I340 Nonrheumatic mitral (valve) insufficiency: Secondary | ICD-10-CM | POA: Diagnosis not present

## 2018-02-02 DIAGNOSIS — Y92009 Unspecified place in unspecified non-institutional (private) residence as the place of occurrence of the external cause: Secondary | ICD-10-CM | POA: Diagnosis not present

## 2018-02-02 DIAGNOSIS — R748 Abnormal levels of other serum enzymes: Secondary | ICD-10-CM | POA: Diagnosis not present

## 2018-02-02 DIAGNOSIS — Z888 Allergy status to other drugs, medicaments and biological substances status: Secondary | ICD-10-CM | POA: Diagnosis not present

## 2018-02-02 DIAGNOSIS — I2510 Atherosclerotic heart disease of native coronary artery without angina pectoris: Secondary | ICD-10-CM | POA: Diagnosis present

## 2018-02-02 DIAGNOSIS — Z833 Family history of diabetes mellitus: Secondary | ICD-10-CM

## 2018-02-02 DIAGNOSIS — D649 Anemia, unspecified: Secondary | ICD-10-CM | POA: Diagnosis present

## 2018-02-02 DIAGNOSIS — I509 Heart failure, unspecified: Secondary | ICD-10-CM

## 2018-02-02 DIAGNOSIS — Z79899 Other long term (current) drug therapy: Secondary | ICD-10-CM

## 2018-02-02 DIAGNOSIS — Z89611 Acquired absence of right leg above knee: Secondary | ICD-10-CM

## 2018-02-02 DIAGNOSIS — I129 Hypertensive chronic kidney disease with stage 1 through stage 4 chronic kidney disease, or unspecified chronic kidney disease: Secondary | ICD-10-CM | POA: Diagnosis not present

## 2018-02-02 DIAGNOSIS — R0602 Shortness of breath: Secondary | ICD-10-CM | POA: Diagnosis not present

## 2018-02-02 DIAGNOSIS — Z8249 Family history of ischemic heart disease and other diseases of the circulatory system: Secondary | ICD-10-CM

## 2018-02-02 DIAGNOSIS — K859 Acute pancreatitis without necrosis or infection, unspecified: Secondary | ICD-10-CM | POA: Diagnosis present

## 2018-02-02 DIAGNOSIS — N184 Chronic kidney disease, stage 4 (severe): Secondary | ICD-10-CM

## 2018-02-02 DIAGNOSIS — I5033 Acute on chronic diastolic (congestive) heart failure: Secondary | ICD-10-CM

## 2018-02-02 HISTORY — DX: Tubulo-interstitial nephritis, not specified as acute or chronic: N12

## 2018-02-02 LAB — GLUCOSE, CAPILLARY: Glucose-Capillary: 140 mg/dL — ABNORMAL HIGH (ref 70–99)

## 2018-02-02 LAB — CBC WITH DIFFERENTIAL/PLATELET
Abs Immature Granulocytes: 0.1 10*3/uL — ABNORMAL HIGH (ref 0.00–0.07)
Basophils Absolute: 0 10*3/uL (ref 0.0–0.1)
Basophils Relative: 0 %
EOS ABS: 0 10*3/uL (ref 0.0–0.5)
Eosinophils Relative: 0 %
HCT: 33.3 % — ABNORMAL LOW (ref 39.0–52.0)
Hemoglobin: 10 g/dL — ABNORMAL LOW (ref 13.0–17.0)
IMMATURE GRANULOCYTES: 1 %
Lymphocytes Relative: 9 %
Lymphs Abs: 1.3 10*3/uL (ref 0.7–4.0)
MCH: 27.3 pg (ref 26.0–34.0)
MCHC: 30 g/dL (ref 30.0–36.0)
MCV: 91 fL (ref 80.0–100.0)
Monocytes Absolute: 1.7 10*3/uL — ABNORMAL HIGH (ref 0.1–1.0)
Monocytes Relative: 11 %
Neutro Abs: 11.7 10*3/uL — ABNORMAL HIGH (ref 1.7–7.7)
Neutrophils Relative %: 79 %
Platelets: 299 10*3/uL (ref 150–400)
RBC: 3.66 MIL/uL — ABNORMAL LOW (ref 4.22–5.81)
RDW: 14.6 % (ref 11.5–15.5)
WBC: 14.8 10*3/uL — ABNORMAL HIGH (ref 4.0–10.5)
nRBC: 0 % (ref 0.0–0.2)

## 2018-02-02 LAB — COMPREHENSIVE METABOLIC PANEL
ALT: 37 U/L (ref 0–44)
AST: 31 U/L (ref 15–41)
Albumin: 2.9 g/dL — ABNORMAL LOW (ref 3.5–5.0)
Alkaline Phosphatase: 134 U/L — ABNORMAL HIGH (ref 38–126)
Anion gap: 14 (ref 5–15)
BUN: 86 mg/dL — AB (ref 6–20)
CO2: 22 mmol/L (ref 22–32)
Calcium: 8.9 mg/dL (ref 8.9–10.3)
Chloride: 99 mmol/L (ref 98–111)
Creatinine, Ser: 3.52 mg/dL — ABNORMAL HIGH (ref 0.61–1.24)
GFR calc Af Amer: 24 mL/min — ABNORMAL LOW (ref >60)
GFR calc non Af Amer: 20 mL/min — ABNORMAL LOW (ref >60)
GLUCOSE: 170 mg/dL — AB (ref 70–99)
Potassium: 3.8 mmol/L (ref 3.5–5.1)
Sodium: 135 mmol/L (ref 135–145)
TOTAL PROTEIN: 7.4 g/dL (ref 6.5–8.1)
Total Bilirubin: 1.1 mg/dL (ref 0.3–1.2)

## 2018-02-02 LAB — I-STAT TROPONIN, ED: Troponin i, poc: 0.04 ng/mL (ref 0.00–0.08)

## 2018-02-02 LAB — LIPASE, BLOOD: LIPASE: 210 U/L — AB (ref 11–51)

## 2018-02-02 LAB — POCT I-STAT EG7
ACID-BASE EXCESS: 1 mmol/L (ref 0.0–2.0)
Bicarbonate: 24.9 mmol/L (ref 20.0–28.0)
Calcium, Ion: 1.16 mmol/L (ref 1.15–1.40)
HCT: 30 % — ABNORMAL LOW (ref 39.0–52.0)
Hemoglobin: 10.2 g/dL — ABNORMAL LOW (ref 13.0–17.0)
O2 Saturation: 69 %
Potassium: 3.8 mmol/L (ref 3.5–5.1)
SODIUM: 135 mmol/L (ref 135–145)
TCO2: 26 mmol/L (ref 22–32)
pCO2, Ven: 36.8 mmHg — ABNORMAL LOW (ref 44.0–60.0)
pH, Ven: 7.438 — ABNORMAL HIGH (ref 7.250–7.430)
pO2, Ven: 35 mmHg (ref 32.0–45.0)

## 2018-02-02 LAB — URINALYSIS, ROUTINE W REFLEX MICROSCOPIC
Bilirubin Urine: NEGATIVE
Glucose, UA: NEGATIVE mg/dL
Ketones, ur: NEGATIVE mg/dL
Nitrite: NEGATIVE
Protein, ur: 100 mg/dL — AB
Specific Gravity, Urine: 1.011 (ref 1.005–1.030)
WBC, UA: >50 WBC/hpf — ABNORMAL HIGH (ref 0–5)
pH: 6 (ref 5.0–8.0)

## 2018-02-02 LAB — CBG MONITORING, ED: Glucose-Capillary: 159 mg/dL — ABNORMAL HIGH (ref 70–99)

## 2018-02-02 LAB — BRAIN NATRIURETIC PEPTIDE: B Natriuretic Peptide: 461.9 pg/mL — ABNORMAL HIGH (ref 0.0–100.0)

## 2018-02-02 MED ORDER — SENNOSIDES-DOCUSATE SODIUM 8.6-50 MG PO TABS: 1.0000 | ORAL_TABLET | Freq: Every evening | ORAL | Status: DC | PRN

## 2018-02-02 MED ORDER — CARVEDILOL 25 MG PO TABS: 25.0000 mg | ORAL_TABLET | Freq: Once | ORAL | Status: DC

## 2018-02-02 MED ORDER — POLYETHYLENE GLYCOL 3350 17 G PO PACK
17.0000 g | PACK | Freq: Every day | ORAL | Status: DC | PRN
  Administered 2018-02-03: 17 g via ORAL
  Filled 2018-02-02: qty 1

## 2018-02-02 MED ORDER — HYDRALAZINE HCL 50 MG PO TABS
50.0000 mg | ORAL_TABLET | Freq: Four times a day (QID) | ORAL | Status: DC
  Administered 2018-02-02 – 2018-02-05 (×10): 50 mg via ORAL
  Filled 2018-02-02 (×10): qty 1

## 2018-02-02 MED ORDER — SODIUM CHLORIDE 0.9 % IV SOLN
1.0000 g | INTRAVENOUS | Status: DC
  Administered 2018-02-03: 1 g via INTRAVENOUS
  Filled 2018-02-02 (×2): qty 10

## 2018-02-02 MED ORDER — CYCLOBENZAPRINE HCL 5 MG PO TABS
5.0000 mg | ORAL_TABLET | Freq: Three times a day (TID) | ORAL | Status: DC | PRN
  Administered 2018-02-02 – 2018-02-04 (×4): 5 mg via ORAL
  Filled 2018-02-02 (×4): qty 1

## 2018-02-02 MED ORDER — GABAPENTIN 300 MG PO CAPS
300.0000 mg | ORAL_CAPSULE | Freq: Three times a day (TID) | ORAL | Status: DC
  Administered 2018-02-02 – 2018-02-05 (×8): 300 mg via ORAL
  Filled 2018-02-02 (×9): qty 1

## 2018-02-02 MED ORDER — AMLODIPINE BESYLATE 5 MG PO TABS
5.0000 mg | ORAL_TABLET | Freq: Once | ORAL | Status: AC
  Administered 2018-02-02: 5 mg via ORAL
  Filled 2018-02-02: qty 1

## 2018-02-02 MED ORDER — HYDRALAZINE HCL 25 MG PO TABS
50.0000 mg | ORAL_TABLET | Freq: Once | ORAL | Status: AC
  Administered 2018-02-02: 50 mg via ORAL
  Filled 2018-02-02: qty 2

## 2018-02-02 MED ORDER — ACETAMINOPHEN 325 MG PO TABS
650.0000 mg | ORAL_TABLET | Freq: Four times a day (QID) | ORAL | Status: DC | PRN
  Administered 2018-02-02: 650 mg via ORAL
  Filled 2018-02-02: qty 2

## 2018-02-02 MED ORDER — INSULIN ASPART 100 UNIT/ML ~~LOC~~ SOLN
0.0000 [IU] | SUBCUTANEOUS | Status: DC
  Administered 2018-02-02 – 2018-02-03 (×2): 3 [IU] via SUBCUTANEOUS
  Administered 2018-02-03: 7 [IU] via SUBCUTANEOUS
  Administered 2018-02-03: 3 [IU] via SUBCUTANEOUS

## 2018-02-02 MED ORDER — AMLODIPINE BESYLATE 5 MG PO TABS
5.0000 mg | ORAL_TABLET | Freq: Every day | ORAL | Status: DC
  Administered 2018-02-03 – 2018-02-05 (×3): 5 mg via ORAL
  Filled 2018-02-02 (×3): qty 1

## 2018-02-02 MED ORDER — CARVEDILOL 25 MG PO TABS
25.0000 mg | ORAL_TABLET | Freq: Two times a day (BID) | ORAL | Status: DC
  Administered 2018-02-03 – 2018-02-05 (×5): 25 mg via ORAL
  Filled 2018-02-02 (×5): qty 1

## 2018-02-02 MED ORDER — CALCITRIOL 0.25 MCG PO CAPS
0.2500 ug | ORAL_CAPSULE | Freq: Every day | ORAL | Status: DC
  Administered 2018-02-03 – 2018-02-05 (×3): 0.25 ug via ORAL
  Filled 2018-02-02 (×3): qty 1

## 2018-02-02 MED ORDER — PROMETHAZINE HCL 25 MG/ML IJ SOLN
12.5000 mg | Freq: Four times a day (QID) | INTRAMUSCULAR | Status: DC | PRN
  Administered 2018-02-04: 12.5 mg via INTRAVENOUS
  Filled 2018-02-02: qty 1

## 2018-02-02 MED ORDER — INSULIN GLARGINE 100 UNIT/ML ~~LOC~~ SOLN
70.0000 [IU] | Freq: Every day | SUBCUTANEOUS | Status: DC
  Administered 2018-02-02 – 2018-02-04 (×3): 70 [IU] via SUBCUTANEOUS
  Filled 2018-02-02 (×4): qty 0.7

## 2018-02-02 MED ORDER — SODIUM CHLORIDE 0.9 % IV SOLN
1.0000 g | Freq: Once | INTRAVENOUS | Status: AC
  Administered 2018-02-02: 1 g via INTRAVENOUS
  Filled 2018-02-02: qty 10

## 2018-02-02 MED ORDER — CLONIDINE HCL 0.2 MG PO TABS
0.3000 mg | ORAL_TABLET | Freq: Once | ORAL | Status: AC
  Administered 2018-02-02: 0.3 mg via ORAL
  Filled 2018-02-02: qty 1

## 2018-02-02 MED ORDER — CLONIDINE HCL 0.3 MG PO TABS
0.3000 mg | ORAL_TABLET | Freq: Two times a day (BID) | ORAL | Status: DC
  Administered 2018-02-03 – 2018-02-05 (×5): 0.3 mg via ORAL
  Filled 2018-02-02 (×5): qty 1

## 2018-02-02 MED ORDER — FUROSEMIDE 10 MG/ML IJ SOLN
160.0000 mg | Freq: Two times a day (BID) | INTRAVENOUS | Status: DC
  Administered 2018-02-02 – 2018-02-03 (×2): 160 mg via INTRAVENOUS
  Filled 2018-02-02 (×3): qty 16

## 2018-02-02 MED ORDER — ACETAMINOPHEN 650 MG RE SUPP: 650.0000 mg | Freq: Four times a day (QID) | RECTAL | Status: DC | PRN

## 2018-02-02 MED ORDER — HYDRALAZINE HCL 20 MG/ML IJ SOLN
10.0000 mg | Freq: Once | INTRAMUSCULAR | Status: AC
  Administered 2018-02-02: 10 mg via INTRAVENOUS
  Filled 2018-02-02: qty 1

## 2018-02-02 MED ORDER — ALLOPURINOL 100 MG PO TABS
200.0000 mg | ORAL_TABLET | Freq: Every day | ORAL | Status: DC
  Administered 2018-02-03 – 2018-02-05 (×3): 200 mg via ORAL
  Filled 2018-02-02 (×3): qty 2

## 2018-02-02 MED ORDER — APIXABAN 5 MG PO TABS
5.0000 mg | ORAL_TABLET | Freq: Two times a day (BID) | ORAL | Status: DC
  Administered 2018-02-02 – 2018-02-05 (×6): 5 mg via ORAL
  Filled 2018-02-02 (×7): qty 1

## 2018-02-02 NOTE — ED Triage Notes (Signed)
Pt reports being tx'd for UTI since Wednesday. Pt reports hematuria since last Sunday. Pt reports cough, nasal congestion, shortness of breath since Sunday. Pt also reports some episodes of emesis since Sunday. Pt reports chest pain with coughing.

## 2018-02-02 NOTE — H&P (Addendum)
Date: 02/03/2018               Patient Name:  [name] MRN: [id number]  DOB: [birth year] Age / Sex: 43 y.o., male   PCP: [name], DO         Medical Service: Internal Medicine Teaching Service         Attending Physician: Dr. Sid Falcon, MD    First Contact: Dr. Koleen Distance Pager: [phone]  Second Contact: Dr. Holly Bodily Pager: [phone]       After Hours (After 5p/  First Contact Pager: [phone]  weekends / holidays): Second Contact Pager: [phone]   Chief Complaint: Shortness of breath, dysuria, hematuria and abdominal pain   History of Present Illness: 43 y/o M with PMHx of CAD s/p stent, HTN, HLD, Insulin dependant DM II, CKD III, nephrotic syndrome, Hx of DVT and P/E on Eliquis, presented with  dysuria, hematuria,  and nausea vomiting, abdominal pain and shortness of breath.  He was seen by urologist 4 days ago due to 2-3 days Hx of dysuria and hematuria and found to have UTI. According to patient, CT scan ruled out nephrolithiasis at that time. He was treated with Docyxyclin which he took for 3 days and then stopped it. He reports his symptoms got worse and he developed suprapubic and suprapubic pain, nausea and vomiting since 2 days ago. No fever or chills except 1 episode, 1 week ago. He also reports having dry cough and shortness of breath in past few days that gets worse when lying back. Today he noticed worsening of left lower extremity edema.  He came in to ED today having persistant urinary symptoms, abdominal pain, nausea vomiting, as well as shortness of breath and dry cough. He currently has intermittent throbbing abdominal pain with intensity of 8/10 mostly at epigastric and suprapubic area.    Meds:  Current Meds  Medication Sig  . allopurinol (ZYLOPRIM) 100 MG tablet TAKE 2 TABLETS BY MOUTH ONCE DAILY (Patient taking differently: Take 200 mg by mouth daily. )  . Alpha-Lipoic Acid 200 MG CAPS Take 1 capsule (200 mg total) by mouth  daily.  Marland Kitchen amLODipine (NORVASC) 5 MG tablet TAKE 1 TABLET BY MOUTH ONCE DAILY (Patient taking differently: Take 5 mg by mouth daily. )  . apixaban (ELIQUIS) 5 MG TABS tablet Take 1 tablet (5 mg total) by mouth 2 (two) times daily.  . calcitRIOL (ROCALTROL) 0.25 MCG capsule TAKE 1 CAPSULE BY MOUTH ONCE DAILY (Patient taking differently: Take 0.25 mcg by mouth daily. )  . carvedilol (COREG) 25 MG tablet TAKE 1 TABLET BY MOUTH TWICE DAILY (Patient taking differently: Take 25 mg by mouth 2 (two) times daily with a meal. )  . cloNIDine (CATAPRES) 0.3 MG tablet Take 1 tablet (0.3 mg total) by mouth 2 (two) times daily.  . Cyanocobalamin (B-12) 2500 MCG SUBL Place 2,500 mcg under the tongue daily.  . cyclobenzaprine (FLEXERIL) 5 MG tablet Take 1 tablet (5 mg total) by mouth 3 (three) times daily as needed for muscle spasms.  . furosemide (LASIX) 40 MG tablet Take 2 tablets (80 mg total) by mouth 2 (two) times daily.  Marland Kitchen gabapentin (NEURONTIN) 300 MG capsule TAKE 1 CAPSULE BY MOUTH THREE TIMES DAILY (Patient taking differently: Take 300 mg by mouth 3 (three) times daily. )  . hydrALAZINE (APRESOLINE) 50 MG tablet Take 50 mg by mouth 4 (four) times daily.  Marland Kitchen ibuprofen (ADVIL,MOTRIN) 200 MG tablet Take 200 mg by mouth every  6 (six) hours as needed for headache (pain).  . Insulin Degludec 200 UNIT/ML SOPN Inject 140 Units into the skin daily. (Patient taking differently: Inject 130 Units into the skin daily. )     Allergies: Allergies as of 02/02/2018 - Review Complete 02/02/2018  Allergen Reaction Noted  . Reglan [metoclopramide] Other (See Comments) 11/01/2016   Past Medical History:  Diagnosis Date  . Acute venous embolism and thrombosis of deep vessels of proximal lower extremity ([place]) 07/19/2011  . Anemia   . Chest pain, neg MI, normal coronaries by cath 02/18/2013  . CKD (chronic kidney disease) stage 3, GFR 30-59 ml/min ([facility]) 02/19/2013  . Colon polyps   . Diabetic ulcer of right foot ([place])   .  DVT (deep venous thrombosis) ([place]) 09/2002   patient reports additional DVTs in '06 & '11 (unconfirmed)  . GERD (gastroesophageal reflux disease)   . History of blood transfusion    "related to OR" (10/31/2016)  . Hyperlipidemia 02/19/2013  . Hypertension   . Nausea & vomiting    "constant for the last couple weeks" (10/31/2016)  . Nephrotic syndrome   . Obesity    BMI 44, weight 346 pounds 01/30/14  . Pulmonary embolism ([place]) 09/2002   treated with 6 months of warfarin  . Type I diabetes mellitus ([place]) dx'd 2001    Family History:  DM    Mother Heart disease Mother DM   Maternal uncle   Social History: Non smoker Drinks alcohol socialy Denies illicit drug use   Review of Systems: A complete ROS was negative except as per HPI. Review of Systems  Constitutional: Negative for chills and fever.  Respiratory: Positive for cough and shortness of breath.   Cardiovascular: Positive for orthopnea and leg swelling.  Gastrointestinal: Positive for abdominal pain, nausea and vomiting.  Genitourinary: Positive for dysuria and hematuria. Negative for flank pain.    Physical Exam: Blood pressure (!) 181/70, pulse 96, temperature 100 F (38.6 C), temperature source Oral, resp. rate 20. weight (!) 180.3 kg, SpO2 99 %.  Physical Exam Constitutional:      General: He is not in acute distress.    Appearance: He is obese. He is not toxic-appearing.  HENT:     Head: Normocephalic and atraumatic.  Eyes:     Extraocular Movements: Extraocular movements intact.  Cardiovascular:     Rate and Rhythm: Normal rate and regular rhythm.     Heart sounds: No murmur.  Pulmonary:     Breath sounds: Examination of the right-lower field reveals decreased breath sounds and rales. Examination of the left-lower field reveals decreased breath sounds and rales. (exam was limited due to body habitus) Abdominal:     Palpations: Abdomen is soft.     Tenderness: There is abdominal tenderness in epigastric  and suprapubic area. Murphy sign is negative. There is no guarding or rebound.  Musculoskeletal:     Left lower leg: He exhibits no tenderness. Edema present.  Neurological:     General: No focal deficit present.     Mental Status: He is alert and oriented to person, place, and time.  Psychiatric:        Mood and Affect: Mood normal.        Behavior: Behavior normal.    EKG: personally reviewed my interpretation is non specific ST-T changes  CXR: personally reviewed my interpretation is, PE chest x-ray with poor quality, vascular congestion and questionable cardiomegaly.  Assessment & Plan by Problem: Principal Problem:   Complicated UTI (  urinary tract infection) Active Problems:   Pyelonephritis   Acute heart failure ([place])   Pancreatitis  Mr. Stapel is a 43 year old male with past medical history of DM type II, CKD, history of DVT/PE on Eliquis, OSA, HTN, presented with 1 week Hx of UTI symptoms, nausea vomiting as well as dry cough and orthopnea  Complicated UTI/Pyelonepphritis: Patient presented with dysuria, hematuria, for about a week and recent N/V, suprapubic and epigastric abdominal pain. UA was positive for UTI 1 week ago. He was partially treated (for 3 days) with doxycycline when was seen by urologist few days ago.   Patient came to ED today due to having persistent symptoms, and developing abdominal pain.  Afebrile on arrival (spiked fever after admission), Abdominal exam with some tenderness at epigastric and suprapubic area, without CV angle tenderness.  UA with increased WBC, few bacteria and 11-20 RBCs has mild leukocytosis at 14.8.  Clinical presentation is likely due to complicated UTI vs pyelonephritis. We will treat him with IV antibiotic. If worsening of symptoms, may consider Imaging.  -IV ceftriaxone 1 g every 24 hours -Follow-up urine culture -Cardiac monitoring -Promethazine 12.5 mg IV every 6 hours PRN -Tylenol 650 mg q6h PRN - May consider  Imaging if no improvement.  Elevated Lipase,  ?Pancreatitis:    Patient with elevated lipase at 210 today and reports developing epigastric pain in past 2 days. Has mild tenderness on epigastric area    Pain has been ok so far. Recent CT scan few days ago, showed gallstone but no pancreatitis. We can not give him IV fluid due to volume overload.  Minimizing his PO intake to sip water, holding Victoza and will monitor him. (Previous Hx of probable pancreatitis 2/2 Ozempic, 7 months ago.(He had elevated Lipase at 80, without evidence of pancreatitis on CT scan). Now on Victoza. Elevated Lipase can be 2/2 vomiting -Lipase next AM -Sip with meds (otherwise NPO)   New heart failure: (Last echo 2014 with EF 55-60%, normal except for mild left atrial enlargement.)  Patinet reports 1 week Hx of dry cough, SOB and orthopnea and worsening of leg swelling since 2 days ago. BMP elevated at 461.9 today. (This can even be falsely low in setting of obesity). Has decreased breath sound/diffuse crackle mostly at bases (limited exam due to body habitus). Also some vascular congestion on chest x-ray.   His dry cough and SOB are likely due to volume overload. (no evidence of PNA. No fever or chills. No localized crackle on exam and no consolidation on CXR to be suggestive of pneumonia)  -IV Lasix 160 mg twice daily (He has been on Lasix 80 mg BID and Metolazone at home.) -Daily weight -Strict fluid -Strict I/O's -Nasal O2 to keep O2 saturation more than 92% -Echocardiogram -Troponin  -Hold Metolazone due to acute on chronic kidney disease  AK I on CKD III: Creatinine 3.5 (baseline around 2.3), BUN 86 (baseline at 50s) Has also history of proteinuria/nephrotic syndrome. Her CKD in addition to heart failure can attribute to volume overload. -continue home dose of Calcitriol -BMP in AM -We will avoid IV hydration at this point due to volume overload -Hold Metolazone  HTN: Patient has been hypertensive  in ED, at SBP: 200s-220s.   Not taking his medication today. (Has been on amlodipine, Coreg, hydralazine and clonidine at home.) received 10 mg IV hydralazine once in ED.  Resuming his home meds.  -Continue home Amlodipine 5 mg QD -Continue home hydralazine 50 mg every 6 hours -Continue home  clonidine 0.3 mg twice daily -Continue home Carvedilol 25 mg QD  Insulin dependent DM type II: No evidence of hyperglycemia or DKA (givven abdominal pain, nausea and vomiting) (CBG 170- 159, anion gap is normal) Patient used Lantus 130 u at bedtime at home, Victoza and meal Insulin  -Lantus 70 units, SQ daily at bedtime (due decreased PO intake) -Resistant SSI -Gabapentin 300 mg 3 times daily -CBG monitoring -Holding Victoza  History of DVT/PE: -Continue home Eliquis 5 mg twice daily  Obstructive sleep apnea: -CPAP at night   Diet: Sips water with meds IV fluid: None VTE ppx: Eliquis Code status: Full  Dispo: Admit patient to Inpatient with expected length of stay greater than 2 midnights.  SignedDewayne Hatch, MD 02/03/2018, 12:03 AM  Pager: [phone]

## 2018-02-02 NOTE — ED Notes (Signed)
Pt's O2 89-90% on room air, pt placed on 2L [place]

## 2018-02-02 NOTE — ED Notes (Signed)
Attempted report x1. 

## 2018-02-02 NOTE — Progress Notes (Signed)
Patient setup and place don nasal CPAP. Patient has had a study but never has worn a machine at home so per protocol must wear a nasal mask until a home mask and machine are established. 2L o2 bleed in through machine and patient is tolerating well at this time.

## 2018-02-02 NOTE — ED Provider Notes (Signed)
[facility] EMERGENCY DEPARTMENT Provider Note   CSN: [id number] Arrival date & time: 02/02/18  1530     History   Chief Complaint Chief Complaint  Patient presents with  . Urinary Tract Infection  . Shortness of Breath    HPI [name] is a 43 y.o. male.  HPI   [name] is a 43 y.o. male with PMH of DVT/PE on Eliquis, CKD stage III,type I diabetes on insulin (reports 130 units of Tresiba every morning, 1.2 units of Victoza every morning, 25 units NovoLog 3 times daily) status post right AKA in the setting of diabetic ulcer, HLD, HTN, nausea and vomiting, nephrotic syndrome, obesity, who presents with persistent hematuria and dysuria as well as cough and dyspnea.  Patient reports that on Sunday he developed hematuria and nausea with nonbloody nonbilious emesis.  Had suprapubic discomfort radiating to his right lower quadrant at that time.  It persisted over the next 2 to 3 days and he saw his urologist on Wednesday.  Reports he had a CT scan at that time that showed no nephrolithiasis.  He was diagnosed with a urinary tract infection in the urology clinic and started on doxycycline.  He reports that he was not able to take the remainder of his p.o. medications after Thursday evening as he had persistent nausea and vomiting.  Unable to eat or drink in the last 1 to 2 days.  Feels that his hematuria has worsened.  He has had intermittent back pain bilaterally and felt febrile yesterday and today.  Several presents with nonbloody nonbilious emesis today.  Normal bowel movements.  No worsening of his left lower extremity swelling.  He reports an aching sensation in his chest as well that has worsened throughout Sunday.  He normally goes to the gym 5 days a week but has been too tired to do that.  He is short of breath and has a cough that is productive of creamy sputum without yellow or green sputum.  No hemoptysis.  Reports compliance with all of his other medications  with exception of his insulin which he has not had in 2 days secondary to decreased p.o. intake.  Past Medical History:  Diagnosis Date  . Acute venous embolism and thrombosis of deep vessels of proximal lower extremity ([place]) 07/19/2011  . Anemia   . Chest pain, neg MI, normal coronaries by cath 02/18/2013  . CKD (chronic kidney disease) stage 3, GFR 30-59 ml/min (HCC) 02/19/2013  . Colon polyps   . Diabetic ulcer of right foot ([place])   . DVT (deep venous thrombosis) ([place]) 09/2002   patient reports additional DVTs in '06 & '11 (unconfirmed)  . GERD (gastroesophageal reflux disease)   . History of blood transfusion    "related to OR" (10/31/2016)  . Hyperlipidemia 02/19/2013  . Hypertension   . Nausea & vomiting    "constant for the last couple weeks" (10/31/2016)  . Nephrotic syndrome   . Obesity    BMI 44, weight 346 pounds 01/30/14  . Pulmonary embolism ([place]) 09/2002   treated with 6 months of warfarin  . Type I diabetes mellitus ([place]) dx'd 2001    Patient Active Problem List   Diagnosis Date Noted  . Pyelonephritis 02/02/2018  . Complicated UTI (urinary tract infection) 02/02/2018  . Acute heart failure ([place]) 02/02/2018  . Pancreatitis 02/02/2018  . Urinary frequency 01/17/2018  . Urinary incontinence 01/17/2018  . Mild obstructive sleep apnea 01/17/2018  . Acute back pain 12/11/2017  .  Healthcare maintenance 08/15/2017  . Nausea and vomiting 06/08/2017  . Erectile dysfunction 04/22/2017  . Nocturnal enuresis 04/19/2017  . Blood in stool 03/07/2017  . History of nausea and vomiting 10/31/2016  . Prosthesis adjustment 08/17/2016  . History of pulmonary embolus (PE) 06/09/2016  . Normocytic anemia 01/30/2014  . Constipation   . Above knee amputation of right lower extremity ([place]) 12/26/2013  . GERD (gastroesophageal reflux disease) 08/14/2013  . Hyperlipidemia 02/19/2013  . S/P cardiac cath, 02/18/13, normal coronaries 02/19/2013  . CKD (chronic kidney disease) stage 3, GFR  30-59 ml/min (HCC) 02/19/2013  . Nephrotic syndrome 02/12/2012  . Hypertension 07/24/2011  . Long term current use of anticoagulant therapy 07/19/2011  . History of DVT (deep vein thrombosis) 07/28/2009  . PROTEINURIA, MILD 02/01/2007  . Morbid obesity ([place]) 10/26/2005  . Type II diabetes mellitus ([place]) 01/09/2002    Past Surgical History:  Procedure Laterality Date  . AMPUTATION Right 12/22/2013   Procedure: AMPUTATION BELOW KNEE;  Surgeon: [name], MD;  Location: [facility];  Service: Orthopedics;  Laterality: Right;  . AMPUTATION Right 02/19/2014   Procedure: RIGHT ABOVE KNEE AMPUTATION ;  Surgeon: [name], MD;  Location: [facility];  Service: Orthopedics;  Laterality: Right;  . CARDIAC CATHETERIZATION  02/18/2013   normal coronaries  . ESOPHAGOGASTRODUODENOSCOPY N/A 11/02/2016   Procedure: ESOPHAGOGASTRODUODENOSCOPY (EGD);  Surgeon: [name], MD;  Location: [facility] ENDOSCOPY;  Service: Endoscopy;  Laterality: N/A;  . I&D EXTREMITY Right 12/14/2013   Procedure: IRRIGATION AND DEBRIDEMENT RIGHT FOOT;  Surgeon: [name], MD;  Location: [facility];  Service: Orthopedics;  Laterality: Right;  . INCISION AND DRAINAGE ABSCESS  2007; 2015   "back"  . INCISION AND DRAINAGE OF WOUND Right 12/22/2013   Procedure: I&D RIGHT BUTTOCK;  Surgeon: [name], MD;  Location: [facility];  Service: Orthopedics;  Laterality: Right;  . LEFT HEART CATHETERIZATION WITH CORONARY ANGIOGRAM N/A 02/18/2013   Procedure: LEFT HEART CATHETERIZATION WITH CORONARY ANGIOGRAM;  Surgeon: [name], MD;  Location: [facility] CATH LAB;  Service: Cardiovascular;  Laterality: N/A;        Home Medications    Prior to Admission medications   Medication Sig Start Date End Date Taking? Authorizing Provider  allopurinol (ZYLOPRIM) 100 MG tablet TAKE 2 TABLETS BY MOUTH ONCE DAILY Patient taking differently: Take 200 mg by mouth daily.  09/03/17  Yes Hoffman, Jessica Ratliff, DO  Alpha-Lipoic Acid 200 MG CAPS Take 1 capsule (200  mg total) by mouth daily. 08/17/16  Yes Hoffman, Jessica Ratliff, DO  amLODipine (NORVASC) 5 MG tablet TAKE 1 TABLET BY MOUTH ONCE DAILY Patient taking differently: Take 5 mg by mouth daily.  09/12/17  Yes Hoffman, Jessica Ratliff, DO  apixaban (ELIQUIS) 5 MG TABS tablet Take 1 tablet (5 mg total) by mouth 2 (two) times daily. 08/11/16  Yes Axel Filler, MD  calcitRIOL (ROCALTROL) 0.25 MCG capsule TAKE 1 CAPSULE BY MOUTH ONCE DAILY Patient taking differently: Take 0.25 mcg by mouth daily.  08/13/17  Yes Aldine Contes, MD  carvedilol (COREG) 25 MG tablet TAKE 1 TABLET BY MOUTH TWICE DAILY Patient taking differently: Take 25 mg by mouth 2 (two) times daily with a meal.  08/13/17  Yes Aldine Contes, MD  cloNIDine (CATAPRES) 0.3 MG tablet Take 1 tablet (0.3 mg total) by mouth 2 (two) times daily. 08/14/17  Yes Hoffman, Jessica Ratliff, DO  Cyanocobalamin (B-12) 2500 MCG SUBL Place 2,500 mcg under the tongue daily.   Yes [provider]  cyclobenzaprine (FLEXERIL) 5 MG tablet Take 1 tablet (5 mg total) by mouth 3 (three) times daily as needed for muscle spasms. 12/11/17  Yes Hoffman, Jessica Ratliff, DO  furosemide (LASIX) 40 MG tablet Take 2 tablets (80 mg total) by mouth 2 (two) times daily. 08/11/16  Yes Axel Filler, MD  gabapentin (NEURONTIN) 300 MG capsule TAKE 1 CAPSULE BY MOUTH THREE TIMES DAILY Patient taking differently: Take 300 mg by mouth 3 (three) times daily.  08/13/17  Yes Aldine Contes, MD  hydrALAZINE (APRESOLINE) 50 MG tablet Take 50 mg by mouth 4 (four) times daily.   Yes [provider]  ibuprofen (ADVIL,MOTRIN) 200 MG tablet Take 200 mg by mouth every 6 (six) hours as needed for headache (pain).   Yes [provider]  Insulin Degludec 200 UNIT/ML SOPN Inject 140 Units into the skin daily. Patient taking differently: Inject 130 Units into the skin daily.  08/01/17  Yes Axel Filler, MD  ACCU-CHEK SOFTCLIX LANCETS lancets USE TO  CHECK BLOOD SUGAR 5 TIMES DAILY. DX CODE E11.9. 10/25/17   Kalman Shan Ratliff, DO  glucose blood (ACCU-CHEK AVIVA PLUS) test strip 1 each by Other route 3 (three) times daily. Use as instructed to check blood sugar 3 times daily. DX:E11.65 12/05/17   [name], MD  insulin aspart (NOVOLOG) 100 UNIT/ML FlexPen Inject 25 Units into the skin 3 (three) times daily. Patient taking differently: Inject 25 Units into the skin 3 (three) times daily with meals.  08/11/16 08/11/17  Axel Filler, MD  Insulin Pen Needle (B-D UF III MINI PEN NEEDLES) 31G X 5 MM MISC USE  4 TIMES DAILY 09/05/17   Kalman Shan Ratliff, DO  Insulin Pen Needle (PEN NEEDLES 3/16") 31G X 5 MM MISC Use five times daily 08/14/16   [name], DO  Lancet Devices [facility]) lancets Use to check blood sugars 5 times a day. Dx code:E11.9. Insulin dependent. 08/14/16   Kalman Shan Ratliff, DO  loratadine (CLARITIN) 10 MG tablet Take 10 mg by mouth daily as needed (seasonal allergies).     [provider]  lovastatin (MEVACOR) 20 MG tablet TAKE 1 TABLET BY MOUTH ONCE DAILY Patient taking differently: Take 20 mg by mouth daily at 6 PM.  09/05/17   Hoffman, Elza Rafter, DO  metolazone (ZAROXOLYN) 2.5 MG tablet Take 2.5 mg by mouth every other day.    [provider]  NOVOLOG FLEXPEN 100 UNIT/ML FlexPen INJECT 25 UNITS INTO THE SKIN THREE TIMES DAILY Patient taking differently: 25 Units 3 (three) times daily with meals.  09/28/17   [name], DO  Omega-3 Fatty Acids (FISH OIL PO) Take 1 capsule by mouth at bedtime.    [provider]  polyethylene glycol (MIRALAX / GLYCOLAX) packet Take 17 g by mouth daily as needed for mild constipation.     [provider]  promethazine (PHENERGAN) 12.5 MG tablet Take 1 tablet (12.5 mg total) by mouth every 8 (eight) hours as needed for nausea or vomiting. 06/10/17   Santos-Sanchez, Merlene Morse, MD  senna-docusate (SENOKOT-S)  8.6-50 MG tablet Take 2 tablets by mouth daily as needed for mild constipation. 08/14/17 08/14/18  Hoffman, Janett Billow Ratliff, DO  VICTOZA 18 MG/3ML SOPN INJECT 1.2MG  (0.2 MLS) SUBCUTANEOUSLY ONCE DAILY AT THE SAME TIME Patient taking differently: Inject 1.2 mg into the skin daily.  01/29/18   [name], MD  Zinc Gluconate 100 MG TABS Take 2.2 tablets (220 mg total) by mouth daily. 08/17/16  Kalman Shan Ratliff, DO  zinc sulfate 220 (50 Zn) MG capsule Take 220 mg by mouth daily.    [provider]    Family History Family History  Problem Relation Age of Onset  . Heart disease Mother   . Diabetes Mother   . Diabetes Maternal Uncle   . Diabetes Cousin     Social History Social History   Tobacco Use  . Smoking status: Never Smoker  . Smokeless tobacco: Never Used  Substance Use Topics  . Alcohol use: Yes    Comment: 10/31/2016 "a few beers q 6 months or so"  . Drug use: No     Allergies   Reglan [metoclopramide]   Review of Systems Review of Systems  Constitutional: Positive for activity change, appetite change, chills, fatigue and fever.  HENT: Negative for ear pain and sore throat.   Eyes: Negative for pain and visual disturbance.  Respiratory: Positive for cough, shortness of breath and wheezing.   Cardiovascular: Negative for chest pain and palpitations.  Gastrointestinal: Positive for abdominal pain, nausea and vomiting. Negative for anal bleeding and blood in stool.  Genitourinary: Positive for dysuria, flank pain and hematuria.  Musculoskeletal: Positive for arthralgias and back pain.  Skin: Negative for color change and rash.  Neurological: Negative for dizziness, seizures, syncope and headaches.  All other systems reviewed and are negative.   Physical Exam Updated Vital Signs BP (!) 167/76 (BP Location: Left Arm)   Pulse 95   Temp 98.6 F (37 C) (Axillary)   Resp (!) 22   Wt (!) 180.3 kg   SpO2 99%   BMI 51.03 kg/m   Physical Exam Vitals  signs and nursing note reviewed.  Constitutional:      Appearance: He is well-developed. He is morbidly obese. He is ill-appearing.     Interventions: Nasal cannula in place.  HENT:     Head: Normocephalic and atraumatic.  Eyes:     Conjunctiva/sclera: Conjunctivae normal.  Neck:     Musculoskeletal: Neck supple.  Cardiovascular:     Rate and Rhythm: Normal rate and regular rhythm.     Heart sounds: No murmur.  Pulmonary:     Effort: Tachypnea, accessory muscle usage and respiratory distress present.     Breath sounds: Examination of the right-upper field reveals decreased breath sounds and rhonchi. Examination of the left-upper field reveals decreased breath sounds and rhonchi. Examination of the right-middle field reveals decreased breath sounds and rhonchi. Examination of the left-middle field reveals decreased breath sounds and rhonchi. Examination of the right-lower field reveals decreased breath sounds. Examination of the left-lower field reveals decreased breath sounds. Decreased breath sounds and rhonchi present.     Comments: Grunting every 2-3 breaths which is more prominent when conversing. Abdominal:     Palpations: Abdomen is soft.     Tenderness: There is abdominal tenderness in the right upper quadrant and suprapubic area. There is no right CVA tenderness or left CVA tenderness. Negative signs include Murphy's sign, Rovsing's sign and McBurney's sign.  Skin:    General: Skin is warm and dry.     Capillary Refill: Capillary refill takes less than 2 seconds.  Neurological:     Mental Status: He is alert.  Psychiatric:        Behavior: Behavior is cooperative.      ED Treatments / Results  Labs (all labs ordered are listed, but only abnormal results are displayed) Labs Reviewed  CBC WITH DIFFERENTIAL/PLATELET - Abnormal; Notable for the following  components:      Result Value   WBC 14.8 (*)    RBC 3.66 (*)    Hemoglobin 10.0 (*)    HCT 33.3 (*)    Neutro Abs 11.7  (*)    Monocytes Absolute 1.7 (*)    Abs Immature Granulocytes 0.10 (*)    All other components within normal limits  COMPREHENSIVE METABOLIC PANEL - Abnormal; Notable for the following components:   Glucose, Bld 170 (*)    BUN 86 (*)    Creatinine, Ser 3.52 (*)    Albumin 2.9 (*)    Alkaline Phosphatase 134 (*)    GFR calc non Af Amer 20 (*)    GFR calc Af Amer 24 (*)    All other components within normal limits  LIPASE, BLOOD - Abnormal; Notable for the following components:   Lipase 210 (*)    All other components within normal limits  BRAIN NATRIURETIC PEPTIDE - Abnormal; Notable for the following components:   B Natriuretic Peptide 461.9 (*)    All other components within normal limits  URINALYSIS, ROUTINE W REFLEX MICROSCOPIC - Abnormal; Notable for the following components:   APPearance CLOUDY (*)    Hgb urine dipstick MODERATE (*)    Protein, ur 100 (*)    Leukocytes, UA LARGE (*)    WBC, UA >50 (*)    Bacteria, UA FEW (*)    All other components within normal limits  GLUCOSE, CAPILLARY - Abnormal; Notable for the following components:   Glucose-Capillary 140 (*)    All other components within normal limits  GLUCOSE, CAPILLARY - Abnormal; Notable for the following components:   Glucose-Capillary 150 (*)    All other components within normal limits  CBG MONITORING, ED - Abnormal; Notable for the following components:   Glucose-Capillary 159 (*)    All other components within normal limits  POCT I-STAT EG7 - Abnormal; Notable for the following components:   pH, Ven 7.438 (*)    pCO2, Ven 36.8 (*)    HCT 30.0 (*)    Hemoglobin 10.2 (*)    All other components within normal limits  URINE CULTURE  BLOOD GAS, VENOUS  HIV ANTIBODY (ROUTINE TESTING W REFLEX)  BASIC METABOLIC PANEL  CBC  LIPASE, BLOOD  I-STAT TROPONIN, ED  I-STAT TROPONIN, ED    EKG EKG Interpretation  Date/Time:  Saturday February 02 2018 15:46:09 EST Ventricular Rate:  99 PR Interval:    QRS  Duration: 89 QT Interval:  342 QTC Calculation: 439 R Axis:   -64 Text Interpretation:  Sinus rhythm Nonspecific ST abnormality Confirmed by Lajean Saver [phone]) on 02/02/2018 4:11:43 PM   Radiology Dg Chest Portable 1 View  Result Date: 02/02/2018 CLINICAL DATA:  Shortness of breath over the last 6 days. Hypoxia. EXAM: PORTABLE CHEST 1 VIEW COMPARISON:  02/07/2017 FINDINGS: Lordotic positioning. The patient has taken a poor inspiration. There is cardiomegaly. There may be pulmonary venous hypertension but there is no frank edema. No infiltrate, collapse or effusion. IMPRESSION: Poor inspiration. Cardiomegaly and pulmonary venous hypertension. Electronically Signed   By: Nelson Chimes M.D.   On: 02/02/2018 17:14    Procedures Procedures (including critical care time)  Medications Ordered in ED Medications  carvedilol (COREG) tablet 25 mg (25 mg Oral Not Given 02/02/18 1845)  acetaminophen (TYLENOL) tablet 650 mg (650 mg Oral Given 02/02/18 2144)    Or  acetaminophen (TYLENOL) suppository 650 mg ( Rectal See Alternative 02/02/18 2144)  senna-docusate (Senokot-S) tablet 1 tablet (  has no administration in time range)  furosemide (LASIX) 160 mg in dextrose 5 % 50 mL IVPB (160 mg Intravenous New Bag/Given 02/02/18 2149)  cefTRIAXone (ROCEPHIN) 1 g in sodium chloride 0.9 % 100 mL IVPB (has no administration in time range)  amLODipine (NORVASC) tablet 5 mg (has no administration in time range)  carvedilol (COREG) tablet 25 mg (has no administration in time range)  cloNIDine (CATAPRES) tablet 0.3 mg (has no administration in time range)  hydrALAZINE (APRESOLINE) tablet 50 mg (50 mg Oral Given 02/02/18 2144)  calcitRIOL (ROCALTROL) capsule 0.25 mcg (has no administration in time range)  polyethylene glycol (MIRALAX / GLYCOLAX) packet 17 g (has no administration in time range)  apixaban (ELIQUIS) tablet 5 mg (5 mg Oral Given 02/02/18 2144)  cyclobenzaprine (FLEXERIL) tablet 5 mg (5 mg Oral Given  02/02/18 2144)  gabapentin (NEURONTIN) capsule 300 mg (300 mg Oral Given 02/02/18 2144)  allopurinol (ZYLOPRIM) tablet 200 mg (has no administration in time range)  insulin glargine (LANTUS) injection 70 Units (70 Units Subcutaneous Given 02/02/18 2226)  insulin aspart (novoLOG) injection 0-20 Units (3 Units Subcutaneous Given 02/02/18 2144)  promethazine (PHENERGAN) injection 12.5 mg (has no administration in time range)  cefTRIAXone (ROCEPHIN) 1 g in sodium chloride 0.9 % 100 mL IVPB (0 g Intravenous Stopped 02/02/18 1850)  amLODipine (NORVASC) tablet 5 mg (5 mg Oral Given 02/02/18 1909)  cloNIDine (CATAPRES) tablet 0.3 mg (0.3 mg Oral Given 02/02/18 1908)  hydrALAZINE (APRESOLINE) tablet 50 mg (50 mg Oral Given 02/02/18 1908)  hydrALAZINE (APRESOLINE) injection 10 mg (10 mg Intravenous Given 02/02/18 1910)     Initial Impression / Assessment and Plan / ED Course  I have reviewed the triage vital signs and the nursing notes.  Pertinent labs & imaging results that were available during my care of the patient were reviewed by me and considered in my medical decision making (see chart for details).      MDM:  Imaging: Chest x-ray shows poor inspiration, cardiomegaly, and pulmonary venous hypertension.  ED Provider Interpretation of EKG: Sinus rhythm with left axis deviation, no ST segment depression or pathologic T wave inversions.  Approximately 1 mm ST segment elevation isolated to lead V3.  QTc 439.  Labs: Lipase 210, CMP with a sodium and potassium that are normal, CO2 22, gap 14, BUN 86 up from around 30-50 at baseline, creatinine 3.5 (baseline around 2.0-2.5), glucose 170, CBC with a white count of 14.8 and left shift, hemoglobin 10 down from 11 baseline, UA with moderate blood, greater than 50 WBC, and few bacteria.  Troponin 0.04, VBG 7.4 3/36/35/25  On initial evaluation, patient appears ill. Afebrile and hemodynamically stable on 2 L nasal cannula satting about mid 90s while at rest (mid  80s on room air). Alert and oriented x4, pleasant, and cooperative.  Patient presents with urinary symptoms including hematuria as well as cough and dyspnea as detailed above.  On exam, patient has decreased breath sounds throughout.  While discussing his symptoms the patient became more dyspneic and had grunting as above every 2 to 3 breaths.  Sats in the mid to high 80s at that time on 2 L.  Increased to 3 L nasal cannula.  No history of CHF although BNP is elevated as above and chest x-ray concerning for pulmonary venous hypertension.  Denies a history of sleep apnea.  Concern for acute CHF.  However, he has acute kidney injury as above and discussed indication for Lasix in the ED with admitting team  who recommended holding Lasix until they evaluated him.  They stated they may order lasix after they see him.  UA concerning for infection as above.  Review of multiple prior urine cultures shows no specific growth and no multidrug-resistant bacteria.  Given 1 g of IV Rocephin in the ED.  Given patient's symptoms he most likely has pyelonephritis but overall low suspicion for prostatitis at this time.  This will need to be evaluated further as an inpatient.  Additionally, labs indicative of pancreatitis.  Some pain in the right upper quadrant but no Murphy sign.  He does have a history of gallstones but his pain is mostly concentrated in his suprapubic space at this time.  LFTs and bilirubin normal.  Low suspicion for acute cholecystitis.  Right upper quadrant ultrasound may be beneficial as an inpatient for further evaluation of pancreatitis.  Denies alcohol use.  Also may be secondary to his Victoza.  Labs are not indicative of acute diabetic ketoacidosis.  Admitted to medicine teaching service for further management.  The plan for this patient was discussed with Dr. Ashok Cordia who voiced agreement and who oversaw evaluation and treatment of this patient.   The patient was fully informed and involved with  the history taking, evaluation, workup including labs/images, and plan. The patient's concerns and questions were addressed to the patient's satisfaction and he expressed agreement with the plan to admit.    Final Clinical Impressions(s) / ED Diagnoses   Final diagnoses:  Acute UTI (urinary tract infection)  AKI (acute kidney injury) ([place])  Chronic renal insufficiency, stage 3 (moderate) (HCC)  Uncontrolled hypertension  Acute congestive heart failure, unspecified heart failure type ([place])  Acute hypoxemic respiratory failure [facility])    ED Discharge Orders    None       Jack Mineau, Rodena Goldmann, MD 02/03/18 Albina Billet    Lajean Saver, MD 02/05/18 1200

## 2018-02-03 ENCOUNTER — Other Ambulatory Visit: Payer: Self-pay

## 2018-02-03 ENCOUNTER — Encounter (HOSPITAL_COMMUNITY): Payer: Self-pay | Admitting: *Deleted

## 2018-02-03 DIAGNOSIS — N390 Urinary tract infection, site not specified: Secondary | ICD-10-CM

## 2018-02-03 DIAGNOSIS — Z888 Allergy status to other drugs, medicaments and biological substances status: Secondary | ICD-10-CM

## 2018-02-03 DIAGNOSIS — E8770 Fluid overload, unspecified: Secondary | ICD-10-CM

## 2018-02-03 DIAGNOSIS — N183 Chronic kidney disease, stage 3 (moderate): Secondary | ICD-10-CM

## 2018-02-03 DIAGNOSIS — R748 Abnormal levels of other serum enzymes: Secondary | ICD-10-CM

## 2018-02-03 DIAGNOSIS — I2510 Atherosclerotic heart disease of native coronary artery without angina pectoris: Secondary | ICD-10-CM

## 2018-02-03 DIAGNOSIS — E785 Hyperlipidemia, unspecified: Secondary | ICD-10-CM

## 2018-02-03 DIAGNOSIS — Z794 Long term (current) use of insulin: Secondary | ICD-10-CM

## 2018-02-03 DIAGNOSIS — Z955 Presence of coronary angioplasty implant and graft: Secondary | ICD-10-CM

## 2018-02-03 DIAGNOSIS — E1122 Type 2 diabetes mellitus with diabetic chronic kidney disease: Secondary | ICD-10-CM

## 2018-02-03 DIAGNOSIS — I129 Hypertensive chronic kidney disease with stage 1 through stage 4 chronic kidney disease, or unspecified chronic kidney disease: Secondary | ICD-10-CM

## 2018-02-03 DIAGNOSIS — Z86711 Personal history of pulmonary embolism: Secondary | ICD-10-CM

## 2018-02-03 DIAGNOSIS — Z7901 Long term (current) use of anticoagulants: Secondary | ICD-10-CM

## 2018-02-03 DIAGNOSIS — Z79899 Other long term (current) drug therapy: Secondary | ICD-10-CM

## 2018-02-03 DIAGNOSIS — Z86718 Personal history of other venous thrombosis and embolism: Secondary | ICD-10-CM

## 2018-02-03 DIAGNOSIS — N179 Acute kidney failure, unspecified: Secondary | ICD-10-CM

## 2018-02-03 LAB — GLUCOSE, CAPILLARY
GLUCOSE-CAPILLARY: 97 mg/dL (ref 70–99)
Glucose-Capillary: 111 mg/dL — ABNORMAL HIGH (ref 70–99)
Glucose-Capillary: 131 mg/dL — ABNORMAL HIGH (ref 70–99)
Glucose-Capillary: 141 mg/dL — ABNORMAL HIGH (ref 70–99)
Glucose-Capillary: 149 mg/dL — ABNORMAL HIGH (ref 70–99)
Glucose-Capillary: 150 mg/dL — ABNORMAL HIGH (ref 70–99)
Glucose-Capillary: 161 mg/dL — ABNORMAL HIGH (ref 70–99)
Glucose-Capillary: 68 mg/dL — ABNORMAL LOW (ref 70–99)

## 2018-02-03 LAB — CBC
HCT: 28.7 % — ABNORMAL LOW (ref 39.0–52.0)
HEMOGLOBIN: 9 g/dL — AB (ref 13.0–17.0)
MCH: 28.4 pg (ref 26.0–34.0)
MCHC: 31.4 g/dL (ref 30.0–36.0)
MCV: 90.5 fL (ref 80.0–100.0)
Platelets: 291 10*3/uL (ref 150–400)
RBC: 3.17 MIL/uL — ABNORMAL LOW (ref 4.22–5.81)
RDW: 14.6 % (ref 11.5–15.5)
WBC: 11.5 10*3/uL — ABNORMAL HIGH (ref 4.0–10.5)
nRBC: 0 % (ref 0.0–0.2)

## 2018-02-03 LAB — TROPONIN I
Troponin I: 0.06 ng/mL (ref <0.03)
Troponin I: 0.05 ng/mL (ref <0.03)
Troponin I: 0.04 ng/mL (ref <0.03)

## 2018-02-03 LAB — BASIC METABOLIC PANEL
Anion gap: 11 (ref 5–15)
BUN: 89 mg/dL — AB (ref 6–20)
CO2: 23 mmol/L (ref 22–32)
Calcium: 8.7 mg/dL — ABNORMAL LOW (ref 8.9–10.3)
Chloride: 103 mmol/L (ref 98–111)
Creatinine, Ser: 3.81 mg/dL — ABNORMAL HIGH (ref 0.61–1.24)
GFR calc Af Amer: 21 mL/min — ABNORMAL LOW (ref >60)
GFR calc non Af Amer: 18 mL/min — ABNORMAL LOW (ref >60)
Glucose, Bld: 92 mg/dL (ref 70–99)
Potassium: 3.7 mmol/L (ref 3.5–5.1)
Sodium: 137 mmol/L (ref 135–145)

## 2018-02-03 LAB — HIV ANTIBODY (ROUTINE TESTING W REFLEX): HIV Screen 4th Generation wRfx: NONREACTIVE

## 2018-02-03 LAB — LIPASE, BLOOD: Lipase: 100 U/L — ABNORMAL HIGH (ref 11–51)

## 2018-02-03 MED ORDER — INSULIN ASPART 100 UNIT/ML ~~LOC~~ SOLN
0.0000 [IU] | Freq: Three times a day (TID) | SUBCUTANEOUS | Status: DC
  Administered 2018-02-04 (×3): 4 [IU] via SUBCUTANEOUS
  Administered 2018-02-05 (×2): 3 [IU] via SUBCUTANEOUS

## 2018-02-03 MED ORDER — SENNOSIDES-DOCUSATE SODIUM 8.6-50 MG PO TABS
2.0000 | ORAL_TABLET | Freq: Every evening | ORAL | Status: DC | PRN
  Administered 2018-02-03: 2 via ORAL
  Filled 2018-02-03: qty 2

## 2018-02-03 MED ORDER — METOLAZONE 2.5 MG PO TABS
2.5000 mg | ORAL_TABLET | ORAL | Status: DC
  Administered 2018-02-03 – 2018-02-05 (×2): 2.5 mg via ORAL
  Filled 2018-02-03 (×2): qty 1

## 2018-02-03 MED ORDER — HYDROMORPHONE HCL 2 MG PO TABS
1.0000 mg | ORAL_TABLET | Freq: Once | ORAL | Status: AC
  Administered 2018-02-03: 1 mg via ORAL
  Filled 2018-02-03: qty 1

## 2018-02-03 MED ORDER — FUROSEMIDE 80 MG PO TABS
80.0000 mg | ORAL_TABLET | Freq: Two times a day (BID) | ORAL | Status: DC
  Administered 2018-02-03 – 2018-02-05 (×4): 80 mg via ORAL
  Filled 2018-02-03 (×4): qty 1

## 2018-02-03 NOTE — Progress Notes (Signed)
Pt is refusing cpap at this t8ime. RT will monitor and RN aware.

## 2018-02-03 NOTE — Progress Notes (Addendum)
   Subjective: [name] was seen and evaluated at bedside on morning rounds. No acute events overnight. This morning he is feeling much better. His abdominal pain has resolved, and his breathing has improved. He denies any dysuria, and hematuria also is improving. No further nausea or vomiting.   Objective:  Vital signs in last 24 hours: Vitals:   02/03/18 0117 02/03/18 0201 02/03/18 0412 02/03/18 0730  BP:   (!) 159/46 (!) 158/69  Pulse:   88 85  Resp:   18 (!) 23  Temp:   (!) 97.5 F (36.4 C) 98.3 F (36.8 C)  TempSrc:   Oral Oral  SpO2:   99% 100%  Weight: (!) 180.3 kg     Height:  6\' 2"  (1.88 m)     General: awake, alert, morbidly obese gentleman sitting up in bed in NAD CV: RRR Abd: BS+; abdomen is obese, soft, non-distended, non-tender Ext: LLE with 2+ pitting edema   Assessment/Plan:  Principal Problem:   Complicated UTI (urinary tract infection) Active Problems:   Pyelonephritis   Acute heart failure (HCC)   Pancreatitis  1. Complicated UTI - significantly improved clinically after dose of Rocephin - now afebrile  - will continue IV abx until urine cx results   2. Volume overload - likely in the setting of inability to take home meds due to n/v - he is on lasix 80 mg BID and Metolazone every other day for edema in the setting of CKD III  - orthopnea has resolved and volume status improved on exam  - will transition to home diuretic regimen and continue to monitor - echo has been ordered to rule out new onset heart failure   3. AKI on CKD III - likely in the setting of volume overload; renal function unchanged overnight, but suspect this will improve now that he is having better urine output - will continue to monitor   4. HTN - blood pressure now stable in 150s/90s since resuming home medications  5. Type II DM - CBGs within goal range - may need to increase Lantus as PO intake improves; will continue to monitor - on resistant SSI with meals    Dispo: Anticipated discharge in approximately 1-2 day(s).   Modena Nunnery D, DO 02/03/2018, 11:50 AM Pager: [phone]

## 2018-02-04 ENCOUNTER — Other Ambulatory Visit: Payer: Medicare Other

## 2018-02-04 ENCOUNTER — Inpatient Hospital Stay (HOSPITAL_COMMUNITY): Payer: Medicare Other

## 2018-02-04 ENCOUNTER — Other Ambulatory Visit (HOSPITAL_COMMUNITY): Payer: Medicare Other

## 2018-02-04 DIAGNOSIS — I340 Nonrheumatic mitral (valve) insufficiency: Secondary | ICD-10-CM

## 2018-02-04 DIAGNOSIS — B961 Klebsiella pneumoniae [K. pneumoniae] as the cause of diseases classified elsewhere: Secondary | ICD-10-CM

## 2018-02-04 DIAGNOSIS — Z6841 Body Mass Index (BMI) 40.0 and over, adult: Secondary | ICD-10-CM

## 2018-02-04 LAB — CBC
HCT: 28.4 % — ABNORMAL LOW (ref 39.0–52.0)
Hemoglobin: 8.7 g/dL — ABNORMAL LOW (ref 13.0–17.0)
MCH: 27.4 pg (ref 26.0–34.0)
MCHC: 30.6 g/dL (ref 30.0–36.0)
MCV: 89.6 fL (ref 80.0–100.0)
NRBC: 0 % (ref 0.0–0.2)
Platelets: 325 10*3/uL (ref 150–400)
RBC: 3.17 MIL/uL — ABNORMAL LOW (ref 4.22–5.81)
RDW: 14.6 % (ref 11.5–15.5)
WBC: 11.4 10*3/uL — ABNORMAL HIGH (ref 4.0–10.5)

## 2018-02-04 LAB — BASIC METABOLIC PANEL
ANION GAP: 14 (ref 5–15)
BUN: 97 mg/dL — ABNORMAL HIGH (ref 6–20)
CO2: 22 mmol/L (ref 22–32)
Calcium: 8.5 mg/dL — ABNORMAL LOW (ref 8.9–10.3)
Chloride: 99 mmol/L (ref 98–111)
Creatinine, Ser: 3.62 mg/dL — ABNORMAL HIGH (ref 0.61–1.24)
GFR calc Af Amer: 23 mL/min — ABNORMAL LOW (ref >60)
GFR calc non Af Amer: 20 mL/min — ABNORMAL LOW (ref >60)
Glucose, Bld: 118 mg/dL — ABNORMAL HIGH (ref 70–99)
Potassium: 3.7 mmol/L (ref 3.5–5.1)
Sodium: 135 mmol/L (ref 135–145)

## 2018-02-04 LAB — URINE CULTURE: Culture: >=100000 — AB

## 2018-02-04 LAB — GLUCOSE, CAPILLARY
GLUCOSE-CAPILLARY: 175 mg/dL — AB (ref 70–99)
Glucose-Capillary: 110 mg/dL — ABNORMAL HIGH (ref 70–99)
Glucose-Capillary: 166 mg/dL — ABNORMAL HIGH (ref 70–99)
Glucose-Capillary: 174 mg/dL — ABNORMAL HIGH (ref 70–99)

## 2018-02-04 LAB — ECHOCARDIOGRAM COMPLETE
Height: 74 in
WEIGHTICAEL: 6190.52 [oz_av]

## 2018-02-04 LAB — MAGNESIUM: Magnesium: 2.3 mg/dL (ref 1.7–2.4)

## 2018-02-04 MED ORDER — POTASSIUM CHLORIDE CRYS ER 20 MEQ PO TBCR
40.0000 meq | EXTENDED_RELEASE_TABLET | Freq: Once | ORAL | Status: AC
  Administered 2018-02-04: 40 meq via ORAL
  Filled 2018-02-04: qty 2

## 2018-02-04 MED ORDER — CEFAZOLIN SODIUM-DEXTROSE 1-4 GM/50ML-% IV SOLN
1.0000 g | Freq: Three times a day (TID) | INTRAVENOUS | Status: DC
  Administered 2018-02-04 – 2018-02-05 (×4): 1 g via INTRAVENOUS
  Filled 2018-02-04 (×5): qty 50

## 2018-02-04 MED ORDER — VITAMIN B-12 1000 MCG PO TABS: 1000.0000 ug | ORAL_TABLET | Freq: Every day | ORAL | Status: DC

## 2018-02-04 MED ORDER — SENNOSIDES-DOCUSATE SODIUM 8.6-50 MG PO TABS
2.0000 | ORAL_TABLET | Freq: Two times a day (BID) | ORAL | Status: DC
  Administered 2018-02-04 – 2018-02-05 (×2): 2 via ORAL
  Filled 2018-02-04 (×3): qty 2

## 2018-02-04 MED ORDER — POLYETHYLENE GLYCOL 3350 17 G PO PACK
17.0000 g | PACK | Freq: Every day | ORAL | Status: DC
  Administered 2018-02-04 – 2018-02-05 (×2): 17 g via ORAL
  Filled 2018-02-04 (×2): qty 1

## 2018-02-04 NOTE — Progress Notes (Signed)
  Echocardiogram 2D Echocardiogram has been performed.  Jennette Dubin 02/04/2018, 4:44 PM

## 2018-02-04 NOTE — Progress Notes (Signed)
   02/03/18 1945  Provider Notification  Provider Name/Title Internal Medicine  Date Provider Notified 02/03/18  Time Provider Notified 1944  Notification Type Page  Notification Reason Other (Comment) (Verify CBG/Insulin Q4 hours or AC/HS)  Response See new orders   Order verified for Insulin.  Will continue to monitor patient.  Earleen Reaper RN

## 2018-02-04 NOTE — Progress Notes (Signed)
Pt refuses CPAP.  RT will continue to monitor.  °

## 2018-02-04 NOTE — Progress Notes (Signed)
   02/03/18 2015  Provider Notification  Provider Name/Title Internal Medicine  Date Provider Notified 02/03/18  Time Provider Notified 2015  Notification Type Page  Notification Reason Requested by patient/family  Response See new orders   Patient c/o severe right abdomen and suprapubic pain.  Rates as 8/10.  He then states pain has moved to the left side.  Also, states  He has not have BM since Sunday, 01/27/2018.  Orders received for Miralax, Senokot S, and 1 mg PO Dilaudid.  Orders were implemented.  Will continue to monitor patient.  Earleen Reaper RN

## 2018-02-04 NOTE — Progress Notes (Signed)
   Subjective: [name] was seen and evaluated at bedside. Overnight he had some abdominal pain and received Dilaudid which relieved the pain. He has not had BM since 1/19. His hematuria has resolved and breathing is much improved. Endorses good urine output.   Objective:  Vital signs in last 24 hours: Vitals:   02/03/18 2053 02/04/18 0500 02/04/18 0522 02/04/18 1001  BP: (!) 162/78  (!) 164/71 (!) 144/74  Pulse: 86  80 80  Resp: 18  18 20   Temp: 99.2 F (37.3 C)  98.8 F (37.1 C) 99.4 F (37.4 C)  TempSrc: Oral  Oral Oral  SpO2: 100%  100% 96%  Weight: (!) 180.3 kg (!) 180.3 kg  (!) 175.5 kg  Height:       General: awake, alert, lying in bed in NAD CV: RRR Abd: BS+; abdomen is soft, non-distended, non-tender   Assessment/Plan:  Principal Problem:   Complicated UTI (urinary tract infection) Active Problems:   Morbid obesity ([place])   Uncontrolled hypertension   Chronic renal insufficiency, stage 3 (moderate) (HCC)   Pyelonephritis   Acute heart failure (HCC)   Pancreatitis   AKI (acute kidney injury) ([place])  1. Complicated UTI - improving symptomatically on Rocephin - he has remained afebrile and hematuria resolved - culture growing Klebsiealla; sensitivities pending  2. Volume overload - likely in the setting of inability to take home meds due to n/v and worsening CKD - respiratory symptoms improved; bedside U/S does not reveal signs of pulmonary edema  - having good urine output on home diuretic regimen - will reiterate to nursing need for daily weights  - formal echo has been ordered to rule out new onset heart failure   3. AKI on CKD III - likely in the setting of volume overload or sequelae of severe uncontrolled HTN (blood pressure 220s on arrival) - renal function fairly unchanged since admission, but suspect this will improve now that he is having better urine output - will continue to monitor   4. HTN - blood pressure now stable in 150s/90s since  resuming home medications  5. Type II DM - CBGs within goal range - may need to increase Lantus as PO intake improves; will continue to monitor - on resistant SSI with meals   Dispo: Anticipated discharge in approximately 2-3 day(s).   Modena Nunnery D, DO 02/04/2018, 11:34 AM Pager: [phone]

## 2018-02-04 NOTE — Progress Notes (Signed)
Patient refused bed alarm.Patient alert and oriented x4. Advised patient to call for assist before getting out of bed. Call bell within reach. Galit Urich, Wonda Cheng, Therapist, sports

## 2018-02-05 ENCOUNTER — Telehealth: Payer: Self-pay

## 2018-02-05 DIAGNOSIS — I5031 Acute diastolic (congestive) heart failure: Secondary | ICD-10-CM

## 2018-02-05 DIAGNOSIS — D649 Anemia, unspecified: Secondary | ICD-10-CM

## 2018-02-05 DIAGNOSIS — I130 Hypertensive heart and chronic kidney disease with heart failure and stage 1 through stage 4 chronic kidney disease, or unspecified chronic kidney disease: Principal | ICD-10-CM

## 2018-02-05 DIAGNOSIS — G4733 Obstructive sleep apnea (adult) (pediatric): Secondary | ICD-10-CM

## 2018-02-05 LAB — BASIC METABOLIC PANEL
Anion gap: 14 (ref 5–15)
BUN: 94 mg/dL — ABNORMAL HIGH (ref 6–20)
CO2: 24 mmol/L (ref 22–32)
CREATININE: 3.2 mg/dL — AB (ref 0.61–1.24)
Calcium: 8.4 mg/dL — ABNORMAL LOW (ref 8.9–10.3)
Chloride: 99 mmol/L (ref 98–111)
GFR calc Af Amer: 26 mL/min — ABNORMAL LOW (ref >60)
GFR calc non Af Amer: 23 mL/min — ABNORMAL LOW (ref >60)
Glucose, Bld: 115 mg/dL — ABNORMAL HIGH (ref 70–99)
Potassium: 3.8 mmol/L (ref 3.5–5.1)
SODIUM: 137 mmol/L (ref 135–145)

## 2018-02-05 LAB — GLUCOSE, CAPILLARY
Glucose-Capillary: 81 mg/dL (ref 70–99)
Glucose-Capillary: 123 mg/dL — ABNORMAL HIGH (ref 70–99)
Glucose-Capillary: 149 mg/dL — ABNORMAL HIGH (ref 70–99)

## 2018-02-05 LAB — CBC
HCT: 28.4 % — ABNORMAL LOW (ref 39.0–52.0)
Hemoglobin: 8.6 g/dL — ABNORMAL LOW (ref 13.0–17.0)
MCH: 27.2 pg (ref 26.0–34.0)
MCHC: 30.3 g/dL (ref 30.0–36.0)
MCV: 89.9 fL (ref 80.0–100.0)
Platelets: 351 10*3/uL (ref 150–400)
RBC: 3.16 MIL/uL — ABNORMAL LOW (ref 4.22–5.81)
RDW: 14.6 % (ref 11.5–15.5)
WBC: 10.8 10*3/uL — AB (ref 4.0–10.5)
nRBC: 0 % (ref 0.0–0.2)

## 2018-02-05 MED ORDER — CEPHALEXIN 250 MG PO CAPS: 250.0000 mg | ORAL_CAPSULE | Freq: Three times a day (TID) | ORAL | 0 refills | Status: DC

## 2018-02-05 MED ORDER — SODIUM CHLORIDE 0.9 % IV SOLN
INTRAVENOUS | Status: DC | PRN
  Administered 2018-02-05: 500 mL via INTRAVENOUS

## 2018-02-05 NOTE — Progress Notes (Signed)
   Subjective: [name] was seen and evaluated at bedside on morning rounds. No acute events overnight. His abdominal pain has resolved and breathing is back at baseline. We discussed his echo yesterday which showed new diastolic heart failure. He is requesting to see a cardiologist which we will arrange. All other questions and concerns were addressed. He is stable for discharge today.   Objective:  Vital signs in last 24 hours: Vitals:   02/04/18 2147 02/05/18 0331 02/05/18 0900 02/05/18 1027  BP: (!) 152/75 (!) 169/73 (!) 160/73 (!) 174/74  Pulse: 86 84 81 84  Resp: 20 20 (!) 24 20  Temp: 98.3 F (36.8 C) 98.5 F (36.9 C) 98.5 F (36.9 C) 99.6 F (37.6 C)  TempSrc: Oral Oral Oral Oral  SpO2: 97% 94% 98% 94%  Weight: (!) 175.5 kg     Height:       General: awake, alert, sitting up in bed in NAD CV: RRR Pulm: normal respiratory effort; lungs CTA bilaterally Abd: BS+; abdomen is soft, non-distended, non-tender  Assessment/Plan:  Principal Problem:   Complicated UTI (urinary tract infection) Active Problems:   Morbid obesity ([place])   Uncontrolled hypertension   Chronic renal insufficiency, stage 3 (moderate) (HCC)   Acute heart failure (HCC)   AKI (acute kidney injury) ([place])  1. Complicated UTI - symptomatically improved - culture growing Klebsiella sensitive to Ancef; will transition to 4 more days of Keflex to complete a 7 day course   2.Acute HFpEF - echo showed newly found grade 2 diastolic dysfunction - volume status has improved on home Lasix and Metolazone; down 5 kg - reiterated importance of blood pressure control and getting CPAP at home to treat OSA moving forward - requests to establish care with cardiologist which we will arrange at discharge - he is medically stable for discharge home with close outpatient follow-up   3. AKI on CKD III - in the setting of volume overload - renal function improved with diuresis - will arrange close follow-up in our  clinic to continue to monitor  4. HTN - blood pressure now stable in 150s/90s since resuming home medications - may need to be titrated as needed in the outpatient setting   5. Type II DM - CBGs were well controlled inpatient - will resume outpatient regimen at discharge   Dispo: Patient is medically stable for discharge home today.   Modena Nunnery D, DO 02/05/2018, 1:52 PM Pager: [phone]

## 2018-02-05 NOTE — Care Management Note (Signed)
Case Management Note Manya Silvas, RN MSN CCM Transitions of Care 75[address] [place] [phone]  Patient Details  Name: [name] MRN: [id number] Date of Birth: May 22, 1975  Subjective/Objective:       Complicated UTI             Action/Plan: PTA home alone. States he has an Aide who helps with whatever is needed. No problems getting medications (uses [facility] at [employer]) or with transportation to medical appointments. Pt states that MD to arrange for cpap-states his concerns is about type of mask because he sleeps with his mouth open. Discussed that there are a variety of different masks available. Discussed f/u with cardiologist and lifestyle choices. Pt states he doesn't use salt when he cooks and that he attends a gym 5 days a week. Pt states he has a ride home and is waiting for his ride to get off work. No transition of care needs identified. Pt to transition home today.   Expected Discharge Date:  02/05/18               Expected Discharge Plan:  Home/Self Care  In-House Referral:  NA  Discharge planning Services  CM Consult  Post Acute Care Choice:  NA Choice offered to:  NA  DME Arranged:  N/A DME Agency:  NA  HH Arranged:  NA HH Agency:  NA  Status of Service:  Completed, signed off  If discussed at [place] of Stay Meetings, dates discussed:    Additional Comments:  Bartholomew Crews, RN 02/05/2018, 3:47 PM

## 2018-02-05 NOTE — Telephone Encounter (Signed)
Hospital TOC per Dr Koleen Distance, discharge 02/05/18, appt 02/11/18.

## 2018-02-05 NOTE — Discharge Summary (Signed)
Name: [name] MRN: [id number] DOB: [birth year] 43 y.o. PCP: [name], DO  Date of Admission: 02/02/2018  3:32 PM Date of Discharge: 02/05/2018 Attending Physician: Lucious Groves, DO  Discharge Diagnosis: 1. Complicated UTI 2. Newly diagnosed HFpEF 3. AKI on CKD III 4. Normocytic anemia   Discharge Medications: Allergies as of 02/05/2018      Reactions   Reglan [metoclopramide] Other (See Comments)   Dysphoric reaction      Medication List    TAKE these medications   accu-chek softclix lancets Use to check blood sugars 5 times a day. Dx code:E11.9. Insulin dependent.   ACCU-CHEK SOFTCLIX LANCETS lancets USE TO CHECK BLOOD SUGAR 5 TIMES DAILY. DX CODE E11.9.   allopurinol 100 MG tablet Commonly known as:  ZYLOPRIM TAKE 2 TABLETS BY MOUTH ONCE DAILY   Alpha-Lipoic Acid 200 MG Caps Take 1 capsule (200 mg total) by mouth daily.   amLODipine 5 MG tablet Commonly known as:  NORVASC TAKE 1 TABLET BY MOUTH ONCE DAILY   apixaban 5 MG Tabs tablet Commonly known as:  ELIQUIS Take 1 tablet (5 mg total) by mouth 2 (two) times daily.   B-12 2500 MCG Subl Place 2,500 mcg under the tongue daily.   calcitRIOL 0.25 MCG capsule Commonly known as:  ROCALTROL TAKE 1 CAPSULE BY MOUTH ONCE DAILY   carvedilol 25 MG tablet Commonly known as:  COREG TAKE 1 TABLET BY MOUTH TWICE DAILY What changed:  when to take this   cloNIDine 0.3 MG tablet Commonly known as:  CATAPRES Take 1 tablet (0.3 mg total) by mouth 2 (two) times daily.   cyclobenzaprine 5 MG tablet Commonly known as:  FLEXERIL Take 1 tablet (5 mg total) by mouth 3 (three) times daily as needed for muscle spasms.   FISH OIL PO Take 1 capsule by mouth at bedtime.   furosemide 40 MG tablet Commonly known as:  LASIX Take 2 tablets (80 mg total) by mouth 2 (two) times daily.   gabapentin 100 MG capsule Commonly known as:  NEURONTIN Take 100 mg by mouth 3 (three) times daily.   glucose blood  test strip Commonly known as:  ACCU-CHEK AVIVA PLUS 1 each by Other route 3 (three) times daily. Use as instructed to check blood sugar 3 times daily. DX:E11.65   hydrALAZINE 50 MG tablet Commonly known as:  APRESOLINE Take 50 mg by mouth 4 (four) times daily.   insulin aspart 100 UNIT/ML FlexPen Commonly known as:  NOVOLOG Inject 25 Units into the skin 3 (three) times daily. What changed:  Another medication with the same name was changed. Make sure you understand how and when to take each.   NOVOLOG FLEXPEN 100 UNIT/ML FlexPen Generic drug:  insulin aspart INJECT 25 UNITS INTO THE SKIN THREE TIMES DAILY What changed:  See the new instructions.   Insulin Degludec 200 UNIT/ML Sopn Inject 140 Units into the skin daily. What changed:    how much to take  additional instructions   loratadine 10 MG tablet Commonly known as:  CLARITIN Take 10 mg by mouth daily as needed (seasonal allergies).   lovastatin 20 MG tablet Commonly known as:  MEVACOR TAKE 1 TABLET BY MOUTH ONCE DAILY What changed:  when to take this   metolazone 2.5 MG tablet Commonly known as:  ZAROXOLYN Take 2.5 mg by mouth every other day.   Pen Needles 3/16" 31G X 5 MM Misc Use five times daily   Insulin Pen Needle 31G X 5  MM Misc Commonly known as:  B-D UF III MINI PEN NEEDLES USE  4 TIMES DAILY   polyethylene glycol packet Commonly known as:  MIRALAX / GLYCOLAX Take 17 g by mouth daily as needed for mild constipation.   promethazine 12.5 MG tablet Commonly known as:  PHENERGAN Take 1 tablet (12.5 mg total) by mouth every 8 (eight) hours as needed for nausea or vomiting.   senna-docusate 8.6-50 MG tablet Commonly known as:  Senokot-S Take 2 tablets by mouth daily as needed for mild constipation.   VICTOZA 18 MG/3ML Sopn Generic drug:  liraglutide INJECT 1.2MG  (0.2 MLS) SUBCUTANEOUSLY ONCE DAILY AT THE SAME TIME What changed:  See the new instructions.   zinc sulfate 220 (50 Zn) MG  capsule Take 220 mg by mouth daily.     ASK your doctor about these medications   cephALEXin 250 MG capsule Commonly known as:  KEFLEX Take 1 capsule (250 mg total) by mouth 3 (three) times daily for 4 days. Ask about: Should I take this medication?       Disposition and follow-up:   Mr.Demont Kilburg was discharged from [facility] in Stable condition.  At the hospital follow up visit please address:  1.  Complicated UTI: Culture growing Klebsiella with sensitivity to Keflex. Prescribed an additional 4 days to complete 7 day course (finish on 02/09/18) Newly found HFpEF: Patient presented in volume overload and found to have G2DD. Please evaluate volume status.  AKI on CKD III: in the setting of volume overload. Creatinine 3.2 at discharge. Please repeat at follow-up  Normocytic anemia: recommend repeat iron studies   2.  Labs / imaging needed at time of follow-up: CBC, BMP  3.  Pending labs/ test needing follow-up: none  Follow-up Appointments: Follow-up Information    [place]. Go on 02/11/2018.   Why:  at 1:15  Contact information: [address] [place] Hospital Course by problem list: 1. Complicated UTI: [name] presented with dysuria, hematuria, n/v, and abdominal pain. He presented to his urologist 4 days prior to admission with similar symptoms and was started on Doxycyline for UTI, but unable to take medication due to n/v. He was started on IV ceftriaxone with symptomatic improvement. Culture grew Klebsiella. When sensitivities returned he was narrowed to Ancef. Patient had resolution of abdominal pain, urinary symptoms, nausea and vomiting. He was discharged on 4 days of Keflex to complete a total 7 day course.   2. Acute HFpEF: Patient presented with volume overload. Echo was done and revealed newly found Grade 2 diastolic dysfunction. Patient received 1 day of IV diuresis  and was transitioned to home regimen of Lasix and Metolazone. His respiratory status returned to baseline, and he diuresed 5 kg throughout admission. Weight at discharge 175.5 kg. He requested to establish care with a cardiologist. Referral placed at discharge.   3. AKI on CKD III: In the setting of volume overload. Creatinine subsequently improved with diuresis. 3.2 at discharge. Will need to continue to be followed closely outpatient.   4. Normocytic anemia: Had work-up in November. At that time, iron studies were borderline. Would recommend repeat studies outpatient. Hgb stable at 8.6 at discharge.    Discharge Vitals:   BP (!) 164/77 (BP Location: Left Wrist)   Pulse 81   Temp 98.3 F (36.8 C) (Oral)   Resp (!) 22   Ht 6\' 2"  (1.88 m)  Wt (!) 175.5 kg   SpO2 97%   BMI 49.68 kg/m   Pertinent Labs, Studies, and Procedures:  TTE:   Study Conclusions  - Left ventricle: The cavity size was normal. There was mild   concentric hypertrophy. Systolic function was normal. Wall motion   was normal; there were no regional wall motion abnormalities.   Features are consistent with a pseudonormal left ventricular   filling pattern, with concomitant abnormal relaxation and   increased filling pressure (grade 2 diastolic dysfunction). - Mitral valve: There was mild regurgitation. - Left atrium: The atrium was moderately dilated. - Pulmonary arteries: PA peak pressure: 31 mm Hg (S).  Discharge Instructions: Discharge Instructions    (HEART FAILURE PATIENTS) Call MD:  Anytime you have any of the following symptoms: 1) 3 pound weight gain in 24 hours or 5 pounds in 1 week 2) shortness of breath, with or without a dry hacking cough 3) swelling in the hands, feet or stomach 4) if you have to sleep on extra pillows at night in order to breathe.   Complete by:  As directed    Ambulatory referral to Cardiology   Complete by:  As directed    Call MD for:  difficulty breathing, headache or visual  disturbances   Complete by:  As directed    Call MD for:  extreme fatigue   Complete by:  As directed    Call MD for:  persistant dizziness or light-headedness   Complete by:  As directed    Diet - low sodium heart healthy   Complete by:  As directed    Discharge instructions   Complete by:  As directed    Mr. Hawker, It was a pleasure taking care of you. I am glad you are feeling better. For your UTI, you will continue antibiotics for 4 more days.  In regards to your diastolic heart failure, we are continuing your Lasix and Metolazone as prescribed. We will not make any changes to your home medications at this time. I have referred you to a cardiologist to establish care with and make any adjustments to your medications as needed.  I will message our clinic to call and schedule you an appointment whenever they know which days Dr. Heber [place] will be in clinic next week.  Take care, Dr. Koleen Distance   Increase activity slowly   Complete by:  As directed       Signed: Delice Bison, DO 02/11/2018, 8:00 AM   Pager: [phone]

## 2018-02-05 NOTE — Progress Notes (Signed)
Patient discharged to home. Patient AVS reviewed and signed. Patient capable re-verbalizing medications and follow-up appointments. IV removed. Patient belongings sent with patient. Patient educated to return to the ED in the event of SOB, chest pain or dizziness.   Farley Ly, RN

## 2018-02-07 ENCOUNTER — Ambulatory Visit: Payer: Medicare Other | Admitting: Endocrinology

## 2018-02-08 ENCOUNTER — Emergency Department (HOSPITAL_COMMUNITY): Payer: Medicare Other

## 2018-02-08 ENCOUNTER — Other Ambulatory Visit: Payer: Self-pay

## 2018-02-08 ENCOUNTER — Encounter (HOSPITAL_COMMUNITY): Payer: Self-pay | Admitting: Emergency Medicine

## 2018-02-08 ENCOUNTER — Inpatient Hospital Stay (HOSPITAL_COMMUNITY)
Admission: EM | Admit: 2018-02-08 | Discharge: 2018-02-14 | DRG: 728 | Disposition: A | Discharge status: Home / Self Care | Payer: Medicare Other | Attending: Oncology | Admitting: Oncology

## 2018-02-08 DIAGNOSIS — K921 Melena: Secondary | ICD-10-CM | POA: Diagnosis present

## 2018-02-08 DIAGNOSIS — I5032 Chronic diastolic (congestive) heart failure: Secondary | ICD-10-CM | POA: Diagnosis present

## 2018-02-08 DIAGNOSIS — Z794 Long term (current) use of insulin: Secondary | ICD-10-CM

## 2018-02-08 DIAGNOSIS — N179 Acute kidney failure, unspecified: Secondary | ICD-10-CM | POA: Diagnosis not present

## 2018-02-08 DIAGNOSIS — T402X5A Adverse effect of other opioids, initial encounter: Secondary | ICD-10-CM | POA: Diagnosis present

## 2018-02-08 DIAGNOSIS — E785 Hyperlipidemia, unspecified: Secondary | ICD-10-CM | POA: Diagnosis present

## 2018-02-08 DIAGNOSIS — N50819 Testicular pain, unspecified: Secondary | ICD-10-CM

## 2018-02-08 DIAGNOSIS — Z87441 Personal history of nephrotic syndrome: Secondary | ICD-10-CM

## 2018-02-08 DIAGNOSIS — Z89611 Acquired absence of right leg above knee: Secondary | ICD-10-CM

## 2018-02-08 DIAGNOSIS — K3184 Gastroparesis: Secondary | ICD-10-CM | POA: Diagnosis present

## 2018-02-08 DIAGNOSIS — R112 Nausea with vomiting, unspecified: Secondary | ICD-10-CM | POA: Diagnosis present

## 2018-02-08 DIAGNOSIS — E669 Obesity, unspecified: Secondary | ICD-10-CM | POA: Diagnosis present

## 2018-02-08 DIAGNOSIS — N12 Tubulo-interstitial nephritis, not specified as acute or chronic: Secondary | ICD-10-CM | POA: Diagnosis present

## 2018-02-08 DIAGNOSIS — E119 Type 2 diabetes mellitus without complications: Secondary | ICD-10-CM

## 2018-02-08 DIAGNOSIS — Z79899 Other long term (current) drug therapy: Secondary | ICD-10-CM

## 2018-02-08 DIAGNOSIS — N451 Epididymitis: Principal | ICD-10-CM | POA: Diagnosis present

## 2018-02-08 DIAGNOSIS — K219 Gastro-esophageal reflux disease without esophagitis: Secondary | ICD-10-CM | POA: Diagnosis present

## 2018-02-08 DIAGNOSIS — E860 Dehydration: Secondary | ICD-10-CM | POA: Diagnosis present

## 2018-02-08 DIAGNOSIS — N5089 Other specified disorders of the male genital organs: Secondary | ICD-10-CM | POA: Diagnosis present

## 2018-02-08 DIAGNOSIS — K5903 Drug induced constipation: Secondary | ICD-10-CM | POA: Diagnosis present

## 2018-02-08 DIAGNOSIS — K8020 Calculus of gallbladder without cholecystitis without obstruction: Secondary | ICD-10-CM | POA: Diagnosis not present

## 2018-02-08 DIAGNOSIS — Z86718 Personal history of other venous thrombosis and embolism: Secondary | ICD-10-CM

## 2018-02-08 DIAGNOSIS — N183 Chronic kidney disease, stage 3 unspecified: Secondary | ICD-10-CM | POA: Diagnosis present

## 2018-02-08 DIAGNOSIS — Z833 Family history of diabetes mellitus: Secondary | ICD-10-CM

## 2018-02-08 DIAGNOSIS — K30 Functional dyspepsia: Secondary | ICD-10-CM | POA: Diagnosis present

## 2018-02-08 DIAGNOSIS — D649 Anemia, unspecified: Secondary | ICD-10-CM | POA: Diagnosis present

## 2018-02-08 DIAGNOSIS — Z8249 Family history of ischemic heart disease and other diseases of the circulatory system: Secondary | ICD-10-CM

## 2018-02-08 DIAGNOSIS — D631 Anemia in chronic kidney disease: Secondary | ICD-10-CM | POA: Diagnosis present

## 2018-02-08 DIAGNOSIS — S78111A Complete traumatic amputation at level between right hip and knee, initial encounter: Secondary | ICD-10-CM

## 2018-02-08 DIAGNOSIS — M109 Gout, unspecified: Secondary | ICD-10-CM | POA: Diagnosis present

## 2018-02-08 DIAGNOSIS — R1012 Left upper quadrant pain: Secondary | ICD-10-CM | POA: Diagnosis not present

## 2018-02-08 DIAGNOSIS — Z7901 Long term (current) use of anticoagulants: Secondary | ICD-10-CM

## 2018-02-08 DIAGNOSIS — I130 Hypertensive heart and chronic kidney disease with heart failure and stage 1 through stage 4 chronic kidney disease, or unspecified chronic kidney disease: Secondary | ICD-10-CM | POA: Diagnosis not present

## 2018-02-08 DIAGNOSIS — R1084 Generalized abdominal pain: Secondary | ICD-10-CM | POA: Diagnosis not present

## 2018-02-08 DIAGNOSIS — Z8719 Personal history of other diseases of the digestive system: Secondary | ICD-10-CM

## 2018-02-08 DIAGNOSIS — Z87440 Personal history of urinary (tract) infections: Secondary | ICD-10-CM

## 2018-02-08 DIAGNOSIS — I2510 Atherosclerotic heart disease of native coronary artery without angina pectoris: Secondary | ICD-10-CM | POA: Diagnosis present

## 2018-02-08 DIAGNOSIS — I160 Hypertensive urgency: Secondary | ICD-10-CM | POA: Diagnosis present

## 2018-02-08 DIAGNOSIS — E1022 Type 1 diabetes mellitus with diabetic chronic kidney disease: Secondary | ICD-10-CM | POA: Diagnosis present

## 2018-02-08 DIAGNOSIS — R1110 Vomiting, unspecified: Secondary | ICD-10-CM

## 2018-02-08 DIAGNOSIS — E10649 Type 1 diabetes mellitus with hypoglycemia without coma: Secondary | ICD-10-CM | POA: Diagnosis not present

## 2018-02-08 DIAGNOSIS — E1043 Type 1 diabetes mellitus with diabetic autonomic (poly)neuropathy: Secondary | ICD-10-CM | POA: Diagnosis present

## 2018-02-08 DIAGNOSIS — Z86711 Personal history of pulmonary embolism: Secondary | ICD-10-CM

## 2018-02-08 DIAGNOSIS — G4733 Obstructive sleep apnea (adult) (pediatric): Secondary | ICD-10-CM | POA: Diagnosis present

## 2018-02-08 DIAGNOSIS — Z888 Allergy status to other drugs, medicaments and biological substances status: Secondary | ICD-10-CM

## 2018-02-08 DIAGNOSIS — N390 Urinary tract infection, site not specified: Secondary | ICD-10-CM | POA: Diagnosis present

## 2018-02-08 DIAGNOSIS — Z6841 Body Mass Index (BMI) 40.0 and over, adult: Secondary | ICD-10-CM

## 2018-02-08 DIAGNOSIS — N184 Chronic kidney disease, stage 4 (severe): Secondary | ICD-10-CM | POA: Diagnosis present

## 2018-02-08 HISTORY — DX: Encounter for fitting and adjustment of unspecified external prosthetic device: Z44.9

## 2018-02-08 HISTORY — DX: Tubulo-interstitial nephritis, not specified as acute or chronic: N12

## 2018-02-08 LAB — URINALYSIS, ROUTINE W REFLEX MICROSCOPIC
Bilirubin Urine: NEGATIVE
Glucose, UA: 50 mg/dL — AB
Ketones, ur: NEGATIVE mg/dL
Nitrite: NEGATIVE
Protein, ur: >=300 mg/dL — AB
Specific Gravity, Urine: 1.015 (ref 1.005–1.030)
pH: 6 (ref 5.0–8.0)

## 2018-02-08 LAB — CBC
HCT: 33.7 % — ABNORMAL LOW (ref 39.0–52.0)
Hemoglobin: 10.2 g/dL — ABNORMAL LOW (ref 13.0–17.0)
MCH: 27.5 pg (ref 26.0–34.0)
MCHC: 30.3 g/dL (ref 30.0–36.0)
MCV: 90.8 fL (ref 80.0–100.0)
Platelets: 478 10*3/uL — ABNORMAL HIGH (ref 150–400)
RBC: 3.71 MIL/uL — ABNORMAL LOW (ref 4.22–5.81)
RDW: 14.6 % (ref 11.5–15.5)
WBC: 13.7 10*3/uL — ABNORMAL HIGH (ref 4.0–10.5)
nRBC: 0 % (ref 0.0–0.2)

## 2018-02-08 LAB — COMPREHENSIVE METABOLIC PANEL
ALT: 28 U/L (ref 0–44)
AST: 32 U/L (ref 15–41)
Albumin: 2.9 g/dL — ABNORMAL LOW (ref 3.5–5.0)
Alkaline Phosphatase: 115 U/L (ref 38–126)
Anion gap: 12 (ref 5–15)
BUN: 59 mg/dL — ABNORMAL HIGH (ref 6–20)
CHLORIDE: 105 mmol/L (ref 98–111)
CO2: 24 mmol/L (ref 22–32)
Calcium: 9.2 mg/dL (ref 8.9–10.3)
Creatinine, Ser: 2.46 mg/dL — ABNORMAL HIGH (ref 0.61–1.24)
GFR calc Af Amer: 36 mL/min — ABNORMAL LOW (ref >60)
GFR, EST NON AFRICAN AMERICAN: 31 mL/min — AB (ref >60)
Glucose, Bld: 181 mg/dL — ABNORMAL HIGH (ref 70–99)
Potassium: 4.3 mmol/L (ref 3.5–5.1)
Sodium: 141 mmol/L (ref 135–145)
Total Bilirubin: 0.5 mg/dL (ref 0.3–1.2)
Total Protein: 7.7 g/dL (ref 6.5–8.1)

## 2018-02-08 LAB — LIPASE, BLOOD: Lipase: 102 U/L — ABNORMAL HIGH (ref 11–51)

## 2018-02-08 LAB — CBG MONITORING, ED: GLUCOSE-CAPILLARY: 164 mg/dL — AB (ref 70–99)

## 2018-02-08 MED ORDER — SODIUM CHLORIDE 0.9% FLUSH: 3.0000 mL | Freq: Once | INTRAVENOUS | Status: DC

## 2018-02-08 MED ORDER — LACTATED RINGERS IV BOLUS: 1000.0000 mL | Freq: Once | INTRAVENOUS | Status: DC

## 2018-02-08 MED ORDER — HYDROMORPHONE HCL 1 MG/ML IJ SOLN
1.0000 mg | Freq: Once | INTRAMUSCULAR | Status: AC
  Administered 2018-02-09: 1 mg via INTRAVENOUS
  Filled 2018-02-08: qty 1

## 2018-02-08 MED ORDER — ONDANSETRON HCL 4 MG/2ML IJ SOLN
4.0000 mg | Freq: Once | INTRAMUSCULAR | Status: DC
  Filled 2018-02-08: qty 2

## 2018-02-08 MED ORDER — PROMETHAZINE HCL 25 MG/ML IJ SOLN
12.5000 mg | Freq: Once | INTRAMUSCULAR | Status: DC
  Filled 2018-02-08: qty 1

## 2018-02-08 MED ORDER — CEFAZOLIN SODIUM-DEXTROSE 1-4 GM/50ML-% IV SOLN
1.0000 g | Freq: Once | INTRAVENOUS | Status: DC
  Filled 2018-02-08: qty 50

## 2018-02-08 NOTE — ED Notes (Signed)
The pt was just discharged from this hospital for a uti    n and v since Wednesday he has had chills and testicle pain nausea vimiting also

## 2018-02-08 NOTE — H&P (Addendum)
Date: 02/09/2018               Patient Name:  [name] MRN: [id number]  DOB: 03-18-1975 Age / Sex: 43 y.o., male   PCP: [name], DO         Medical Service: Internal Medicine Teaching Service         Attending Physician: Dr.  Dareen Piano    First Contact: Dr. Truman Hayward Pager: [phone]  Second Contact: Dr. Trilby Drummer Pager: [phone]       After Hours (After 5p/  First Contact Pager: [phone]  weekends / holidays): Second Contact Pager: [phone]   Chief Complaint: Nausea and Vomiting   History of Present Illness: [name] is a 43 year old African-American gentleman with coronary artery disease, hypertension, hyperlipidemia, type 1 diabetes mellitus, CKD stage III, HFpEF, history of DVT and pulmonary embolism on Eliquis who presented with emesis.  Patient states he has been NBNB emesis since 1/29 (day after discharge) for more than 4 episodes per day mainly consisting of attempted PO intake that he states . He endorses diffuse abdominal pain that is throbbing/aching in nature that has been ongoing since last hospitalization. He endorses poor appetite; black, tarry stools (new for patient); lightheadedness with standing up w/o syncope; mild shortness of breath with occasional orthopnea. He takes occasional ibuprofen but has been months since last use; usually took 2 tabs at a time). He denies dysuria, hematuria, urinary frequency but endorses testicular pain since 1/28 without penile discharge. Also denies fevers, chills, diarrhea, chest pain or suprapubic pain.  Per chart review, he was recently admitted to the hospital from January 25 to January 28 for complicated UTI and volume overload.  Urinary cultures were found to grow Klebsiella which was sensitive to Ancef.  He was subsequently discharged with Keflex to complete a 7-day course.   ED course: Afebrile, BP 190s/90s, CBC with leukocytosis of 13.7, lipase 102, urinalysis showed positive leukocytes, negative nitrites and rare  bacteria.  CT abdomen and pelvis ruled out pyelonephritis, nephrolithiasis, pancreatitis.  Meds:  Current Meds  Medication Sig  . ACCU-CHEK SOFTCLIX LANCETS lancets USE TO CHECK BLOOD SUGAR 5 TIMES DAILY. DX CODE E11.9.  . allopurinol (ZYLOPRIM) 100 MG tablet TAKE 2 TABLETS BY MOUTH ONCE DAILY (Patient taking differently: Take 200 mg by mouth daily. )  . Alpha-Lipoic Acid 200 MG CAPS Take 1 capsule (200 mg total) by mouth daily.  Marland Kitchen amLODipine (NORVASC) 5 MG tablet TAKE 1 TABLET BY MOUTH ONCE DAILY (Patient taking differently: Take 5 mg by mouth daily. )  . apixaban (ELIQUIS) 5 MG TABS tablet Take 1 tablet (5 mg total) by mouth 2 (two) times daily.  . calcitRIOL (ROCALTROL) 0.25 MCG capsule TAKE 1 CAPSULE BY MOUTH ONCE DAILY (Patient taking differently: Take 0.25 mcg by mouth daily. )  . carvedilol (COREG) 25 MG tablet TAKE 1 TABLET BY MOUTH TWICE DAILY (Patient taking differently: Take 25 mg by mouth 2 (two) times daily with a meal. )  . cephALEXin (KEFLEX) 250 MG capsule Take 1 capsule (250 mg total) by mouth 3 (three) times daily for 4 days.  . cloNIDine (CATAPRES) 0.3 MG tablet Take 1 tablet (0.3 mg total) by mouth 2 (two) times daily.  . Cyanocobalamin (B-12) 2500 MCG SUBL Place 2,500 mcg under the tongue daily.  . furosemide (LASIX) 40 MG tablet Take 2 tablets (80 mg total) by mouth 2 (two) times daily.  Marland Kitchen gabapentin (NEURONTIN) 100 MG capsule Take 100 mg by mouth 3 (three)  times daily.  Marland Kitchen glucose blood (ACCU-CHEK AVIVA PLUS) test strip 1 each by Other route 3 (three) times daily. Use as instructed to check blood sugar 3 times daily. DX:E11.65  . hydrALAZINE (APRESOLINE) 50 MG tablet Take 50 mg by mouth 4 (four) times daily.  Marland Kitchen ibuprofen (ADVIL,MOTRIN) 200 MG tablet Take 200 mg by mouth every 6 (six) hours as needed for headache (pain).  . Insulin Degludec 200 UNIT/ML SOPN Inject 140 Units into the skin daily. (Patient taking differently: Inject 130 Units into the skin daily. Tyler Aas)  .  Insulin Pen Needle (B-D UF III MINI PEN NEEDLES) 31G X 5 MM MISC USE  4 TIMES DAILY  . Insulin Pen Needle (PEN NEEDLES 3/16") 31G X 5 MM MISC Use five times daily  . Lancet Devices (ACCU-CHEK SOFTCLIX) lancets Use to check blood sugars 5 times a day. Dx code:E11.9. Insulin dependent.  Marland Kitchen loratadine (CLARITIN) 10 MG tablet Take 10 mg by mouth daily as needed (seasonal allergies).   Marland Kitchen lovastatin (MEVACOR) 20 MG tablet TAKE 1 TABLET BY MOUTH ONCE DAILY (Patient taking differently: Take 20 mg by mouth daily at 6 PM. )  . metolazone (ZAROXOLYN) 2.5 MG tablet Take 2.5 mg by mouth every other day.  Marland Kitchen NOVOLOG FLEXPEN 100 UNIT/ML FlexPen INJECT 25 UNITS INTO THE SKIN THREE TIMES DAILY (Patient taking differently: 25 Units 3 (three) times daily with meals. )  . Omega-3 Fatty Acids (FISH OIL PO) Take 1 capsule by mouth at bedtime.  Marland Kitchen oxybutynin (DITROPAN) 5 MG tablet Take 5 mg by mouth 3 (three) times daily.  . polyethylene glycol (MIRALAX / GLYCOLAX) packet Take 17 g by mouth daily as needed for mild constipation.   . senna-docusate (SENOKOT-S) 8.6-50 MG tablet Take 2 tablets by mouth daily as needed for mild constipation.  Marland Kitchen VICTOZA 18 MG/3ML SOPN INJECT 1.2MG  (0.2 MLS) SUBCUTANEOUSLY ONCE DAILY AT THE SAME TIME (Patient taking differently: Inject 1.2 mg into the skin daily. )  . zinc sulfate 220 (50 Zn) MG capsule Take 220 mg by mouth daily.     Allergies: Allergies as of 02/08/2018 - Review Complete 02/03/2018  Allergen Reaction Noted  . Reglan [metoclopramide] Other (See Comments) 11/01/2016   Past Medical History:  Diagnosis Date  . Acute venous embolism and thrombosis of deep vessels of proximal lower extremity ([place]) 07/19/2011  . Anemia   . Chest pain, neg MI, normal coronaries by cath 02/18/2013  . CKD (chronic kidney disease) stage 3, GFR 30-59 ml/min ([facility]) 02/19/2013  . Colon polyps   . Diabetic ulcer of right foot ([place])   . DVT (deep venous thrombosis) ([place]) 09/2002   patient reports  additional DVTs in '06 & '11 (unconfirmed)  . GERD (gastroesophageal reflux disease)   . History of blood transfusion    "related to OR" (10/31/2016)  . Hyperlipidemia 02/19/2013  . Hypertension   . Nausea & vomiting    "constant for the last couple weeks" (10/31/2016)  . Nephrotic syndrome   . Obesity    BMI 44, weight 346 pounds 01/30/14  . Pulmonary embolism ([place]) 09/2002   treated with 6 months of warfarin  . Type I diabetes mellitus ([place]) dx'd 2001   Social History: Occasionally drinks alcohol, denies illicit drug use or cigarette use.  Review of Systems: A complete ROS was negative except as per HPI.   Physical Exam: Blood pressure (!) 188/79, pulse 86, temperature (!) 97.5 F (36.4 C), temperature source Oral, resp. rate 19, height 6\' 2"  (1.88 m),  weight (!) 175.5 kg, SpO2 100 %.  Physical Exam Vitals signs reviewed.  Constitutional:      General: He is not in acute distress.    Appearance: Normal appearance. He is obese. He is not ill-appearing, toxic-appearing or diaphoretic.  HENT:     Head: Normocephalic and atraumatic.     Mouth/Throat:     Mouth: Mucous membranes are moist.     Pharynx: Oropharynx is clear.  Eyes:     Conjunctiva/sclera: Conjunctivae normal.  Cardiovascular:     Rate and Rhythm: Normal rate and regular rhythm.     Pulses: Normal pulses.     Heart sounds: No murmur. No friction rub. No gallop.   Pulmonary:     Effort: Pulmonary effort is normal. No respiratory distress.     Breath sounds: Normal breath sounds. No wheezing, rhonchi or rales.  Abdominal:     General: Bowel sounds are normal.     Tenderness: There is abdominal tenderness (RUQ, LLQ). There is right CVA tenderness. There is no guarding or rebound.  Genitourinary:    Penis: Normal.      Comments: Left testicle swollen and tender to palpation Musculoskeletal:     Right lower leg: Right lower leg edema: Right BKA.     Left lower leg: Left lower leg edema: Trace edema.  Skin:     General: Skin is warm.  Neurological:     General: No focal deficit present.     Mental Status: He is alert.  Psychiatric:        Mood and Affect: Mood normal.        Behavior: Behavior normal.     Assessment & Plan by Problem: Active Problems:   Nausea and vomiting  Mr. Sjogren is a 43 year old African-American gentleman with CAD, HTN, HLD, IDDM, CKD stage III, HFpEF, history of DVT and PE on Eliquis, recent admission for Klebsiella UTI presenting with NBNB emesis.  #Nonbilious nonbloody emesis #Probable gastroparesis Etiology of his presentation currently remains unclear.  Pyelonephritis, nephrolithiasis, cholecystitis, SBO and pancreatitis have all been ruled out with with CT abdomen and pelvis.  Given his history of insulin-dependent diabetes mellitus, gastroparesis remains a possibility.  Gastric emptying study in 2018 showed mild delayed gastric emptying. -Clear liquid diet, advance as tolerated -IV fluids -IV Phenergan q6 hrs prn   #Rule out epididymitis, Orchitis, testicular torsion -Complains of a 4-day history of left testicular pain.  Denies urethral discharge.  On physical exam, left testes appears more swollen and tenderness on palpation. -Scrotal ultrasound with Doppler did not reveal testicular torsion however did show Complex left extratesticular/epididymal fluid collection or mass which could reflect hematoma, abscess, or heterogeneous mass lesion. -Continue empiric abx for possible epididymitis  -Monitor clinical signs  -Consult Urology   #Suspected upper GI bleed -Reports of a one-time episode of melena.  Denies hematochezia, ingestion iron supplements or Pepto-Bismol.  He does occasionally takes NSAIDs.  EGD in October 2018 was unremarkable.  -Follow-up FOBT -Follow-up CBC  #Complicated UTI #Rule out pyelonephritis - Recent admission with urine culture growing Klebsiella and discharged with Keflex - Continue cefazolin in ED - Follow up urine culture - Follow  up renal ultrasound   #Hypertensive urgency - Known history of hypertension.  BP on admission elevated in the 190s/90s.  Does not appear to have any signs of endorgan damage - Continue Coreg, clonidine, amlodipine, hydralazine -Takes amlodipine, Coreg, clonidine, Lasix, hydralazine, metolazone at home  #Insulin-dependent diabetes mellitus - Home medications include Tresiba 140U daily, Victoza  1.2 mg daily, NovoLog 25U TID - Start Lantus 70 units daily  #Chronic normocytic anemia -Hemoglobin 10.2 (previously 8.6) -Follow-up iron panel -Follow-up CBC  #CKD stage III -sCr 2.46 (Baseline 2.2) -Continue to monitor  #HFpEF - Recent echo was unable to comment on ejection fraction.  He was found to have G2DD -Hold Lasix, metolazone -Strict I's and O's -Daily weights  #History of DVT and pulmonary embolism - Continue Eliquis  #History of overactive bladder vs urinary retention -CT abdomen and pelvis does not show evidence of bladder distention.  Also denies difficulty with urination. -Takes oxybutynin at home -Obtain postvoid residual  #Gout -Continue allopurinol  #Secondary hypocalcemia - Due to chronic kidney disease -Continue calcitriol  FEN: N.p.o., replace electrolytes as needed VTE ppx: Eliquis CODE STATUS: Full code  Dispo: Admit patient to Observation with expected length of stay less than 2 midnights.  Signed: Jean Rosenthal, MD 02/09/2018, 12:53 AM  Pager: [phone] [facility] PGY-1

## 2018-02-08 NOTE — ED Provider Notes (Signed)
[facility] EMERGENCY DEPARTMENT Provider Note   CSN: [id number] Arrival date & time: 02/08/18  1833     History   Chief Complaint Chief Complaint  Patient presents with  . Emesis    HPI [name] is a 43 y.o. male.  HPI  [name] is a 43 y.o. male with PMH of  DVT/PE on Eliquis, CKD stage III,type I diabetes on insulin (reports 130 units of Tresiba every morning, 1.2 units of Victoza every morning, 25 units NovoLog 3 times daily) status post right AKA in the setting of diabetic ulcer, HLD, HTN, nausea and vomiting, nephrotic syndrome, obesity who presents with his girlfriend at bedside after the patient has had significant decreased p.o. intake for the last several days.  He was recently admitted and then discharged on Tuesday after he was treated for UTI with IV antibiotics in the hospital.  Required diuresis for volume overload.  Report some of his medications were changed and he was discharged home.  However, he has not been able to tolerate significant p.o. in the last 2 to 3 days.  He has not been able take his medications including his antibiotics (discharged on Keflex) for about 48+ hours.  All of his emesis has been nonbloody and nonbilious.  He has had suprapubic discomfort as well as some left flank pain.  Pain is difficult for her to describe and constant.  No recent falls or trauma.  Feels less edematous than he was prior to recent admission.  No fevers or chills.  Still has some mild urinary symptoms.  Not sure if his infection has improved based on the way he feels.  Reports he has not had insulin in about 2 days because he has not eaten.  He has not been checking his sugar at home.  Has no significant chest pain or dyspnea.  No cough.  Past Medical History:  Diagnosis Date  . Acute venous embolism and thrombosis of deep vessels of proximal lower extremity ([place]) 07/19/2011  . Anemia   . Chest pain, neg MI, normal coronaries by cath 02/18/2013  . CKD  (chronic kidney disease) stage 3, GFR 30-59 ml/min (HCC) 02/19/2013  . Colon polyps   . Diabetic ulcer of right foot ([place])   . DVT (deep venous thrombosis) ([place]) 09/2002   patient reports additional DVTs in '06 & '11 (unconfirmed)  . GERD (gastroesophageal reflux disease)   . History of blood transfusion    "related to OR" (10/31/2016)  . Hyperlipidemia 02/19/2013  . Hypertension   . Nausea & vomiting    "constant for the last couple weeks" (10/31/2016)  . Nephrotic syndrome   . Obesity    BMI 44, weight 346 pounds 01/30/14  . Pulmonary embolism ([place]) 09/2002   treated with 6 months of warfarin  . Type I diabetes mellitus ([place]) dx'd 2001    Patient Active Problem List   Diagnosis Date Noted  . AKI (acute kidney injury) ([place])   . Pyelonephritis 02/02/2018  . Complicated UTI (urinary tract infection) 02/02/2018  . Acute heart failure ([place]) 02/02/2018  . Urinary frequency 01/17/2018  . Urinary incontinence 01/17/2018  . Mild obstructive sleep apnea 01/17/2018  . Acute back pain 12/11/2017  . Healthcare maintenance 08/15/2017  . Nausea and vomiting 06/08/2017  . Erectile dysfunction 04/22/2017  . Nocturnal enuresis 04/19/2017  . Blood in stool 03/07/2017  . History of nausea and vomiting 10/31/2016  . Prosthesis adjustment 08/17/2016  . History of pulmonary embolus (PE) 06/09/2016  .  Normocytic anemia 01/30/2014  . Constipation   . Above knee amputation of right lower extremity ([place]) 12/26/2013  . GERD (gastroesophageal reflux disease) 08/14/2013  . Hyperlipidemia 02/19/2013  . S/P cardiac cath, 02/18/13, normal coronaries 02/19/2013  . Chronic renal insufficiency, stage 3 (moderate) ([place]) 02/19/2013  . Nephrotic syndrome 02/12/2012  . Uncontrolled hypertension 07/24/2011  . Long term current use of anticoagulant therapy 07/19/2011  . History of DVT (deep vein thrombosis) 07/28/2009  . PROTEINURIA, MILD 02/01/2007  . Morbid obesity ([place]) 10/26/2005  . Type II diabetes  mellitus ([place]) 01/09/2002    Past Surgical History:  Procedure Laterality Date  . AMPUTATION Right 12/22/2013   Procedure: AMPUTATION BELOW KNEE;  Surgeon: [name], MD;  Location: [facility];  Service: Orthopedics;  Laterality: Right;  . AMPUTATION Right 02/19/2014   Procedure: RIGHT ABOVE KNEE AMPUTATION ;  Surgeon: [name], MD;  Location: [facility];  Service: Orthopedics;  Laterality: Right;  . CARDIAC CATHETERIZATION  02/18/2013   normal coronaries  . ESOPHAGOGASTRODUODENOSCOPY N/A 11/02/2016   Procedure: ESOPHAGOGASTRODUODENOSCOPY (EGD);  Surgeon: [name], MD;  Location[facility] ENDOSCOPY;  Service: Endoscopy;  Laterality: N/A;  . I&D EXTREMITY Right 12/14/2013   Procedure: IRRIGATION AND DEBRIDEMENT RIGHT FOOT;  Surgeon: [name], MD;  Location: [facility];  Service: Orthopedics;  Laterality: Right;  . INCISION AND DRAINAGE ABSCESS  2007; 2015   "back"  . INCISION AND DRAINAGE OF WOUND Right 12/22/2013   Procedure: I&D RIGHT BUTTOCK;  Surgeon: [name], MD;  Location: [facility];  Service: Orthopedics;  Laterality: Right;  . LEFT HEART CATHETERIZATION WITH CORONARY ANGIOGRAM N/A 02/18/2013   Procedure: LEFT HEART CATHETERIZATION WITH CORONARY ANGIOGRAM;  Surgeon: [name], MD;  Location: [facility] CATH LAB;  Service: Cardiovascular;  Laterality: N/A;        Home Medications    Prior to Admission medications   Medication Sig Start Date End Date Taking? Authorizing Provider  allopurinol (ZYLOPRIM) 100 MG tablet TAKE 2 TABLETS BY MOUTH ONCE DAILY Patient taking differently: Take 200 mg by mouth daily.  09/03/17  Yes Hoffman, Jessica Ratliff, DO  Alpha-Lipoic Acid 200 MG CAPS Take 1 capsule (200 mg total) by mouth daily. 08/17/16  Yes Hoffman, Jessica Ratliff, DO  amLODipine (NORVASC) 5 MG tablet TAKE 1 TABLET BY MOUTH ONCE DAILY Patient taking differently: Take 5 mg by mouth daily.  09/12/17  Yes Hoffman, Jessica Ratliff, DO  apixaban (ELIQUIS) 5 MG TABS tablet Take 1 tablet (5 mg total)  by mouth 2 (two) times daily. 08/11/16  Yes Axel Filler, MD  calcitRIOL (ROCALTROL) 0.25 MCG capsule TAKE 1 CAPSULE BY MOUTH ONCE DAILY Patient taking differently: Take 0.25 mcg by mouth daily.  08/13/17  Yes Aldine Contes, MD  carvedilol (COREG) 25 MG tablet TAKE 1 TABLET BY MOUTH TWICE DAILY Patient taking differently: Take 25 mg by mouth 2 (two) times daily with a meal.  08/13/17  Yes Aldine Contes, MD  cephALEXin (KEFLEX) 250 MG capsule Take 1 capsule (250 mg total) by mouth 3 (three) times daily for 4 days. 02/05/18 02/09/18 Yes Bloomfield, Carley D, DO  cloNIDine (CATAPRES) 0.3 MG tablet Take 1 tablet (0.3 mg total) by mouth 2 (two) times daily. 08/14/17  Yes Hoffman, Jessica Ratliff, DO  Cyanocobalamin (B-12) 2500 MCG SUBL Place 2,500 mcg under the tongue daily.   Yes [provider]  furosemide (LASIX) 40 MG tablet Take 2 tablets (80 mg total) by mouth 2 (two) times daily. 08/11/16  Yes Lalla Brothers  Marcello Moores, MD  gabapentin (NEURONTIN) 100 MG capsule Take 100 mg by mouth 3 (three) times daily.   Yes [provider]  hydrALAZINE (APRESOLINE) 50 MG tablet Take 50 mg by mouth 4 (four) times daily.   Yes [provider]  ibuprofen (ADVIL,MOTRIN) 200 MG tablet Take 200 mg by mouth every 6 (six) hours as needed for headache (pain).   Yes [provider]  Insulin Degludec 200 UNIT/ML SOPN Inject 140 Units into the skin daily. Patient taking differently: Inject 130 Units into the skin daily. Tyler Aas 08/01/17  Yes Axel Filler, MD  loratadine (CLARITIN) 10 MG tablet Take 10 mg by mouth daily as needed (seasonal allergies).    Yes [provider]  lovastatin (MEVACOR) 20 MG tablet TAKE 1 TABLET BY MOUTH ONCE DAILY Patient taking differently: Take 20 mg by mouth daily at 6 PM.  09/05/17  Yes Hoffman, Jessica Ratliff, DO  metolazone (ZAROXOLYN) 2.5 MG tablet Take 2.5 mg by mouth every other day.   Yes [provider]  NOVOLOG FLEXPEN  100 UNIT/ML FlexPen INJECT 25 UNITS INTO THE SKIN THREE TIMES DAILY Patient taking differently: 25 Units 3 (three) times daily with meals.  09/28/17  Yes Hoffman, Jessica Ratliff, DO  Omega-3 Fatty Acids (FISH OIL PO) Take 1 capsule by mouth at bedtime.   Yes [provider]  oxybutynin (DITROPAN) 5 MG tablet Take 5 mg by mouth 3 (three) times daily.   Yes [provider]  polyethylene glycol (MIRALAX / GLYCOLAX) packet Take 17 g by mouth daily as needed for mild constipation.    Yes [provider]  senna-docusate (SENOKOT-S) 8.6-50 MG tablet Take 2 tablets by mouth daily as needed for mild constipation. 08/14/17 08/14/18 Yes Hoffman, Jessica Ratliff, DO  VICTOZA 18 MG/3ML SOPN INJECT 1.2MG  (0.2 MLS) SUBCUTANEOUSLY ONCE DAILY AT THE SAME TIME Patient taking differently: Inject 1.2 mg into the skin daily.  01/29/18  Yes [name], MD  zinc sulfate 220 (50 Zn) MG capsule Take 220 mg by mouth daily.   Yes [provider]  ACCU-CHEK SOFTCLIX LANCETS lancets USE TO CHECK BLOOD SUGAR 5 TIMES DAILY. DX CODE E11.9. 10/25/17   Kalman Shan Ratliff, DO  cyclobenzaprine (FLEXERIL) 5 MG tablet Take 1 tablet (5 mg total) by mouth 3 (three) times daily as needed for muscle spasms. Patient not taking: Reported on 02/03/2018 12/11/17   Kalman Shan Ratliff, DO  glucose blood (ACCU-CHEK AVIVA PLUS) test strip 1 each by Other route 3 (three) times daily. Use as instructed to check blood sugar 3 times daily. DX:E11.65 12/05/17   [name], MD  insulin aspart (NOVOLOG) 100 UNIT/ML FlexPen Inject 25 Units into the skin 3 (three) times daily. Patient not taking: Reported on 02/08/2018 08/11/16 02/08/18  Axel Filler, MD  Insulin Pen Needle (B-D UF III MINI PEN NEEDLES) 31G X 5 MM MISC USE  4 TIMES DAILY 09/05/17   Kalman Shan Ratliff, DO  Insulin Pen Needle (PEN NEEDLES 3/16") 31G X 5 MM MISC Use five times daily 08/14/16   [name], DO  Lancet Devices  [facility]) lancets Use to check blood sugars 5 times a day. Dx code:E11.9. Insulin dependent. 08/14/16   Kalman Shan Ratliff, DO  promethazine (PHENERGAN) 12.5 MG tablet Take 1 tablet (12.5 mg total) by mouth every 8 (eight) hours as needed for nausea or vomiting. Patient not taking: Reported on 02/08/2018 06/10/17   Welford Roche, MD    Family History Family History  Problem  Relation Age of Onset  . Heart disease Mother   . Diabetes Mother   . Diabetes Maternal Uncle   . Diabetes Cousin     Social History Social History   Tobacco Use  . Smoking status: Never Smoker  . Smokeless tobacco: Never Used  Substance Use Topics  . Alcohol use: Yes    Comment: 10/31/2016 "a few beers q 6 months or so"  . Drug use: No     Allergies   Reglan [metoclopramide]   Review of Systems Review of Systems  Constitutional: Negative for chills and fever.  HENT: Negative for ear pain and sore throat.   Eyes: Negative for pain and visual disturbance.  Respiratory: Negative for cough and shortness of breath.   Cardiovascular: Negative for chest pain and palpitations.  Gastrointestinal: Positive for abdominal pain, nausea and vomiting. Negative for constipation.  Genitourinary: Positive for dysuria, flank pain and testicular pain. Negative for decreased urine volume, discharge, hematuria, penile pain, penile swelling, scrotal swelling and urgency.  Musculoskeletal: Negative for arthralgias and back pain.  Skin: Negative for color change and rash.  Neurological: Negative for seizures and syncope.  All other systems reviewed and are negative.    Physical Exam Updated Vital Signs BP (!) 188/79   Pulse 86   Temp (!) 97.5 F (36.4 C) (Oral)   Resp 19   Ht 6\' 2"  (1.88 m)   Wt (!) 175.5 kg   SpO2 100%   BMI 49.69 kg/m   Physical Exam Vitals signs and nursing note reviewed.  Constitutional:      Appearance: He is well-developed. He is morbidly obese. He is  ill-appearing. He is not diaphoretic.  HENT:     Head: Normocephalic and atraumatic.  Eyes:     Conjunctiva/sclera: Conjunctivae normal.  Neck:     Musculoskeletal: Neck supple.  Cardiovascular:     Rate and Rhythm: Normal rate and regular rhythm.     Heart sounds: No murmur.  Pulmonary:     Effort: Pulmonary effort is normal. No respiratory distress.     Breath sounds: Normal breath sounds.  Abdominal:     General: Abdomen is flat.     Palpations: Abdomen is soft.     Tenderness: There is generalized abdominal tenderness and tenderness in the left upper quadrant. There is left CVA tenderness. There is no right CVA tenderness, guarding or rebound. Negative signs include Murphy's sign, Rovsing's sign and McBurney's sign.  Genitourinary:    Penis: Normal.      Comments: Performed by attending physician after he reported testicular pain during repeat evaluation, found to be mildly tender around the left epididymis without evidence for additional pathology. Skin:    General: Skin is warm and dry.  Neurological:     Mental Status: He is alert.  Psychiatric:        Behavior: Behavior is cooperative.      ED Treatments / Results  Labs (all labs ordered are listed, but only abnormal results are displayed) Labs Reviewed  LIPASE, BLOOD - Abnormal; Notable for the following components:      Result Value   Lipase 102 (*)    All other components within normal limits  COMPREHENSIVE METABOLIC PANEL - Abnormal; Notable for the following components:   Glucose, Bld 181 (*)    BUN 59 (*)    Creatinine, Ser 2.46 (*)    Albumin 2.9 (*)    GFR calc non Af Amer 31 (*)    GFR calc Af Wyvonnia Lora  36 (*)    All other components within normal limits  CBC - Abnormal; Notable for the following components:   WBC 13.7 (*)    RBC 3.71 (*)    Hemoglobin 10.2 (*)    HCT 33.7 (*)    Platelets 478 (*)    All other components within normal limits  URINALYSIS, ROUTINE W REFLEX MICROSCOPIC - Abnormal; Notable  for the following components:   Glucose, UA 50 (*)    Hgb urine dipstick SMALL (*)    Protein, ur >=300 (*)    Leukocytes, UA SMALL (*)    Bacteria, UA RARE (*)    All other components within normal limits  CBG MONITORING, ED - Abnormal; Notable for the following components:   Glucose-Capillary 164 (*)    All other components within normal limits  URINE CULTURE    EKG None  Radiology Ct Abdomen Pelvis Wo Contrast  Result Date: 02/08/2018 CLINICAL DATA:  Nausea and vomiting following discharge from hospital on Tuesday for urinary tract infection. EXAM: CT ABDOMEN AND PELVIS WITHOUT CONTRAST TECHNIQUE: Multidetector CT imaging of the abdomen and pelvis was performed following the standard protocol without IV contrast. COMPARISON:  01/30/2018 FINDINGS: Lower chest: Hazy opacities in the lung bases could be due to motion artifact or edema. Similar appearance to previous study. Small pericardial effusion. Hepatobiliary: Layering calcification in the gallbladder likely represents multiple small stones. No gallbladder wall thickening or infiltration. No bile duct dilatation. No focal liver lesions. Pancreas: Unremarkable. No pancreatic ductal dilatation or surrounding inflammatory changes. Spleen: Normal in size without focal abnormality. Adrenals/Urinary Tract: Adrenal glands are unremarkable. Kidneys are normal, without renal calculi, focal lesion, or hydronephrosis. Bladder wall is mildly thickened. No filling defects period. Stomach/Bowel: Stomach is within normal limits. Appendix appears normal. No evidence of bowel wall thickening, distention, or inflammatory changes. Vascular/Lymphatic: No significant vascular findings are present. No enlarged abdominal or pelvic lymph nodes. Reproductive: Prostate is unremarkable. Calcifications in the vas deferens. Other: No abdominal wall hernia or abnormality. No abdominopelvic ascites. Musculoskeletal: Degenerative changes in the spine. No destructive bone  lesions. IMPRESSION: 1. No renal or ureteral stone or obstruction. 2. Cholelithiasis without evidence of cholecystitis. 3. Hazy opacities in the lung bases could be due to motion artifact or edema. 4. Small pericardial effusion. Electronically Signed   By: Lucienne Capers M.D.   On: 02/08/2018 23:21    Procedures Procedures (including critical care time)  Medications Ordered in ED Medications  sodium chloride flush (NS) 0.9 % injection 3 mL (has no administration in time range)  ceFAZolin (ANCEF) IVPB 1 g/50 mL premix (has no administration in time range)  lactated ringers bolus 1,000 mL (has no administration in time range)  ondansetron (ZOFRAN) injection 4 mg (has no administration in time range)  promethazine (PHENERGAN) injection 12.5 mg (has no administration in time range)  HYDROmorphone (DILAUDID) injection 1 mg (has no administration in time range)     Initial Impression / Assessment and Plan / ED Course  I have reviewed the triage vital signs and the nursing notes.  Pertinent labs & imaging results that were available during my care of the patient were reviewed by me and considered in my medical decision making (see chart for details).     MDM:  Imaging: CT head and pelvis without contrast shows no renal or ureteral stone or obstruction.  Cholelithiasis without evidence for cholecystitis.  Hazy opacities in the lung bases may be secondary to motion artifact.  Small pericardial effusion.  ED Provider Interpretation of EKG: None indicated at this time.  Labs: CBC with a white count of 13.7, hemoglobin 10.2 above his baseline of around 8.6-8.7, platelets 478 increased from his prior, CMP with BUN of 59 (prior in the 80s to 90s), creatinine 2.46 (prior around 3.2-3.8) lipase 102, glucose 164, UA with proteinuria and small leukocytes (11-20), rare bacteria.  On initial evaluation, patient appears ill and slightly uncomfortable. Afebrile and hemodynamically stable although  chronically hypertensive. Alert and oriented x4, pleasant, and cooperative.  Presents with nausea and vomiting and difficulty tolerating p.o. as above.  Chart review shows patient was discharged on 02/05/2018 with Keflex after he was treated with Ancef for Klebsiella UTI to complete a 7-day course.  His AKI was likely secondary to volume overload and he improved with diuresis in the hospital after receiving Lasix and metolazone.  Echo showed grade 2 diastolic dysfunction.  On exam, patient has tenderness about the left lower quadrant suprapubic region as well as the left flank.  No rebound or guarding.  Positive CVA tenderness on the left.  Initially denies testicular or penile discomfort/pain.  However, on repeat evaluation by attending physician patient reported left testicular pain and GU exam at that time showed tenderness about the left epididymis without additional pathology.  UA as above currently concerning for persistent UTI although testicular pathology may still cause the leukocytes found in his urine.  Rare bacteria present, although, which raises concern for incompletely treated UTI.  He has not had antibiotics in about 2 to 3 days.  Given IV Ancef in the ED and IV LR bolus.  Labs without evidence for metabolic acidosis and no evidence specifically for DKA at this time.  Renal function is improved from his prior.  No significant chest pathology reported and heart and lung exam is overall unremarkable at this time.  Does have increased leukocyte count compared to prior which further supports the possibility of underlying UTI/pyelonephritis.  Repeat urine culture ordered.  CT abdomen pelvis without contrast ordered which showed no acute pathology as above.  Specifically, no nephrolithiasis, evidence for obstruction, and no pathology noted in the penis or scrotum.  Patient was given IV Dilaudid for pain in the ED as well as IV Zofran and IV Phenergan for nausea.  Admitted to medicine teaching service  for further evaluation.  The plan for this patient was discussed with Dr. Winfred Leeds who voiced agreement and who oversaw evaluation and treatment of this patient.   The patient was fully informed and involved with the history taking, evaluation, workup including labs/images, and plan. The patient's concerns and questions were addressed to the patient's satisfaction and he expressed agreement with the plan to admit.    Final Clinical Impressions(s) / ED Diagnoses   Final diagnoses:  Intractable vomiting with nausea, unspecified vomiting type  Dehydration  Pyelonephritis    ED Discharge Orders    None       Pallas Wahlert, Rodena Goldmann, MD 02/09/18 [id number]    Orlie Dakin, MD 02/09/18 [phone]

## 2018-02-08 NOTE — ED Triage Notes (Signed)
Pt st's he was discharged from this hospital on Tues after being treated for UTI.   Pt st's since being discharged he has not been able to keep anything down.  Pt c/o nausea and vomiting

## 2018-02-09 ENCOUNTER — Observation Stay (HOSPITAL_COMMUNITY): Payer: Medicare Other

## 2018-02-09 ENCOUNTER — Other Ambulatory Visit: Payer: Self-pay

## 2018-02-09 DIAGNOSIS — N179 Acute kidney failure, unspecified: Secondary | ICD-10-CM

## 2018-02-09 DIAGNOSIS — K921 Melena: Secondary | ICD-10-CM | POA: Diagnosis present

## 2018-02-09 DIAGNOSIS — B961 Klebsiella pneumoniae [K. pneumoniae] as the cause of diseases classified elsewhere: Secondary | ICD-10-CM | POA: Diagnosis not present

## 2018-02-09 DIAGNOSIS — I130 Hypertensive heart and chronic kidney disease with heart failure and stage 1 through stage 4 chronic kidney disease, or unspecified chronic kidney disease: Secondary | ICD-10-CM | POA: Diagnosis not present

## 2018-02-09 DIAGNOSIS — N451 Epididymitis: Secondary | ICD-10-CM | POA: Diagnosis not present

## 2018-02-09 DIAGNOSIS — I2510 Atherosclerotic heart disease of native coronary artery without angina pectoris: Secondary | ICD-10-CM

## 2018-02-09 DIAGNOSIS — E1022 Type 1 diabetes mellitus with diabetic chronic kidney disease: Secondary | ICD-10-CM | POA: Diagnosis not present

## 2018-02-09 DIAGNOSIS — I5030 Unspecified diastolic (congestive) heart failure: Secondary | ICD-10-CM

## 2018-02-09 DIAGNOSIS — N183 Chronic kidney disease, stage 3 (moderate): Secondary | ICD-10-CM

## 2018-02-09 DIAGNOSIS — N5089 Other specified disorders of the male genital organs: Secondary | ICD-10-CM | POA: Diagnosis not present

## 2018-02-09 DIAGNOSIS — Z7901 Long term (current) use of anticoagulants: Secondary | ICD-10-CM

## 2018-02-09 DIAGNOSIS — Z6841 Body Mass Index (BMI) 40.0 and over, adult: Secondary | ICD-10-CM | POA: Diagnosis not present

## 2018-02-09 DIAGNOSIS — I5032 Chronic diastolic (congestive) heart failure: Secondary | ICD-10-CM | POA: Diagnosis present

## 2018-02-09 DIAGNOSIS — N50819 Testicular pain, unspecified: Secondary | ICD-10-CM | POA: Diagnosis not present

## 2018-02-09 DIAGNOSIS — N390 Urinary tract infection, site not specified: Secondary | ICD-10-CM | POA: Diagnosis not present

## 2018-02-09 DIAGNOSIS — E119 Type 2 diabetes mellitus without complications: Secondary | ICD-10-CM | POA: Diagnosis not present

## 2018-02-09 DIAGNOSIS — E785 Hyperlipidemia, unspecified: Secondary | ICD-10-CM | POA: Diagnosis present

## 2018-02-09 DIAGNOSIS — R1032 Left lower quadrant pain: Secondary | ICD-10-CM

## 2018-02-09 DIAGNOSIS — R112 Nausea with vomiting, unspecified: Secondary | ICD-10-CM

## 2018-02-09 DIAGNOSIS — M109 Gout, unspecified: Secondary | ICD-10-CM | POA: Diagnosis present

## 2018-02-09 DIAGNOSIS — E860 Dehydration: Secondary | ICD-10-CM | POA: Diagnosis not present

## 2018-02-09 DIAGNOSIS — E1043 Type 1 diabetes mellitus with diabetic autonomic (poly)neuropathy: Secondary | ICD-10-CM | POA: Diagnosis present

## 2018-02-09 DIAGNOSIS — R1012 Left upper quadrant pain: Secondary | ICD-10-CM

## 2018-02-09 DIAGNOSIS — I160 Hypertensive urgency: Secondary | ICD-10-CM

## 2018-02-09 DIAGNOSIS — K30 Functional dyspepsia: Secondary | ICD-10-CM | POA: Diagnosis present

## 2018-02-09 DIAGNOSIS — Z794 Long term (current) use of insulin: Secondary | ICD-10-CM | POA: Diagnosis not present

## 2018-02-09 DIAGNOSIS — E10649 Type 1 diabetes mellitus with hypoglycemia without coma: Secondary | ICD-10-CM | POA: Diagnosis not present

## 2018-02-09 DIAGNOSIS — N50812 Left testicular pain: Secondary | ICD-10-CM

## 2018-02-09 DIAGNOSIS — R1013 Epigastric pain: Secondary | ICD-10-CM | POA: Diagnosis not present

## 2018-02-09 DIAGNOSIS — Z86711 Personal history of pulmonary embolism: Secondary | ICD-10-CM

## 2018-02-09 DIAGNOSIS — K3184 Gastroparesis: Secondary | ICD-10-CM | POA: Diagnosis present

## 2018-02-09 DIAGNOSIS — Z86718 Personal history of other venous thrombosis and embolism: Secondary | ICD-10-CM

## 2018-02-09 DIAGNOSIS — S78111A Complete traumatic amputation at level between right hip and knee, initial encounter: Secondary | ICD-10-CM | POA: Diagnosis not present

## 2018-02-09 DIAGNOSIS — K5903 Drug induced constipation: Secondary | ICD-10-CM | POA: Diagnosis present

## 2018-02-09 DIAGNOSIS — D649 Anemia, unspecified: Secondary | ICD-10-CM | POA: Diagnosis not present

## 2018-02-09 DIAGNOSIS — Z89511 Acquired absence of right leg below knee: Secondary | ICD-10-CM

## 2018-02-09 DIAGNOSIS — G4733 Obstructive sleep apnea (adult) (pediatric): Secondary | ICD-10-CM | POA: Diagnosis present

## 2018-02-09 DIAGNOSIS — K219 Gastro-esophageal reflux disease without esophagitis: Secondary | ICD-10-CM | POA: Diagnosis present

## 2018-02-09 DIAGNOSIS — Z79899 Other long term (current) drug therapy: Secondary | ICD-10-CM

## 2018-02-09 DIAGNOSIS — Z888 Allergy status to other drugs, medicaments and biological substances status: Secondary | ICD-10-CM

## 2018-02-09 DIAGNOSIS — T402X5A Adverse effect of other opioids, initial encounter: Secondary | ICD-10-CM | POA: Diagnosis present

## 2018-02-09 DIAGNOSIS — D631 Anemia in chronic kidney disease: Secondary | ICD-10-CM

## 2018-02-09 DIAGNOSIS — D72829 Elevated white blood cell count, unspecified: Secondary | ICD-10-CM

## 2018-02-09 DIAGNOSIS — N12 Tubulo-interstitial nephritis, not specified as acute or chronic: Secondary | ICD-10-CM | POA: Diagnosis present

## 2018-02-09 LAB — CBC
HCT: 29.5 % — ABNORMAL LOW (ref 39.0–52.0)
HEMOGLOBIN: 8.8 g/dL — AB (ref 13.0–17.0)
MCH: 27.3 pg (ref 26.0–34.0)
MCHC: 29.8 g/dL — ABNORMAL LOW (ref 30.0–36.0)
MCV: 91.6 fL (ref 80.0–100.0)
Platelets: 455 10*3/uL — ABNORMAL HIGH (ref 150–400)
RBC: 3.22 MIL/uL — AB (ref 4.22–5.81)
RDW: 14.6 % (ref 11.5–15.5)
WBC: 13.2 10*3/uL — ABNORMAL HIGH (ref 4.0–10.5)
nRBC: 0 % (ref 0.0–0.2)

## 2018-02-09 LAB — IRON AND TIBC
Iron: 43 ug/dL — ABNORMAL LOW (ref 45–182)
Saturation Ratios: 22 % (ref 17.9–39.5)
TIBC: 196 ug/dL — ABNORMAL LOW (ref 250–450)
UIBC: 153 ug/dL

## 2018-02-09 LAB — RENAL FUNCTION PANEL
Albumin: 2.4 g/dL — ABNORMAL LOW (ref 3.5–5.0)
Anion gap: 12 (ref 5–15)
BUN: 61 mg/dL — ABNORMAL HIGH (ref 6–20)
CALCIUM: 9 mg/dL (ref 8.9–10.3)
CO2: 24 mmol/L (ref 22–32)
Chloride: 105 mmol/L (ref 98–111)
Creatinine, Ser: 2.69 mg/dL — ABNORMAL HIGH (ref 0.61–1.24)
GFR calc Af Amer: 33 mL/min — ABNORMAL LOW (ref >60)
GFR calc non Af Amer: 28 mL/min — ABNORMAL LOW (ref >60)
Glucose, Bld: 149 mg/dL — ABNORMAL HIGH (ref 70–99)
Phosphorus: 4.4 mg/dL (ref 2.5–4.6)
Potassium: 4.1 mmol/L (ref 3.5–5.1)
Sodium: 141 mmol/L (ref 135–145)

## 2018-02-09 LAB — GLUCOSE, CAPILLARY
GLUCOSE-CAPILLARY: 110 mg/dL — AB (ref 70–99)
Glucose-Capillary: 136 mg/dL — ABNORMAL HIGH (ref 70–99)
Glucose-Capillary: 168 mg/dL — ABNORMAL HIGH (ref 70–99)
Glucose-Capillary: 187 mg/dL — ABNORMAL HIGH (ref 70–99)
Glucose-Capillary: 53 mg/dL — ABNORMAL LOW (ref 70–99)
Glucose-Capillary: 102 mg/dL — ABNORMAL HIGH (ref 70–99)

## 2018-02-09 LAB — FERRITIN: Ferritin: 850 ng/mL — ABNORMAL HIGH (ref 24–336)

## 2018-02-09 MED ORDER — INSULIN ASPART 100 UNIT/ML ~~LOC~~ SOLN
0.0000 [IU] | Freq: Three times a day (TID) | SUBCUTANEOUS | Status: DC
  Administered 2018-02-09 – 2018-02-10 (×3): 4 [IU] via SUBCUTANEOUS
  Administered 2018-02-10: 3 [IU] via SUBCUTANEOUS
  Administered 2018-02-11: 4 [IU] via SUBCUTANEOUS
  Administered 2018-02-11 – 2018-02-12 (×2): 3 [IU] via SUBCUTANEOUS
  Administered 2018-02-12: 4 [IU] via SUBCUTANEOUS
  Administered 2018-02-13 (×2): 3 [IU] via SUBCUTANEOUS
  Administered 2018-02-14: 100 [IU] via SUBCUTANEOUS
  Administered 2018-02-14: 4 [IU] via SUBCUTANEOUS

## 2018-02-09 MED ORDER — OXYBUTYNIN CHLORIDE 5 MG PO TABS
5.0000 mg | ORAL_TABLET | Freq: Three times a day (TID) | ORAL | Status: DC
  Administered 2018-02-09 – 2018-02-14 (×17): 5 mg via ORAL
  Filled 2018-02-09 (×17): qty 1

## 2018-02-09 MED ORDER — ACETAMINOPHEN 325 MG PO TABS: 650.0000 mg | ORAL_TABLET | Freq: Four times a day (QID) | ORAL | Status: DC | PRN

## 2018-02-09 MED ORDER — LACTATED RINGERS IV SOLN
INTRAVENOUS | Status: AC
  Administered 2018-02-09: 02:00:00 via INTRAVENOUS

## 2018-02-09 MED ORDER — HYDRALAZINE HCL 50 MG PO TABS
50.0000 mg | ORAL_TABLET | Freq: Four times a day (QID) | ORAL | Status: DC
  Administered 2018-02-09 – 2018-02-14 (×23): 50 mg via ORAL
  Filled 2018-02-09 (×5): qty 1
  Filled 2018-02-09: qty 2
  Filled 2018-02-09 (×7): qty 1
  Filled 2018-02-09: qty 2
  Filled 2018-02-09 (×8): qty 1

## 2018-02-09 MED ORDER — ALLOPURINOL 100 MG PO TABS
200.0000 mg | ORAL_TABLET | Freq: Every day | ORAL | Status: DC
  Administered 2018-02-09 – 2018-02-14 (×6): 200 mg via ORAL
  Filled 2018-02-09 (×6): qty 2

## 2018-02-09 MED ORDER — CEFAZOLIN SODIUM-DEXTROSE 1-4 GM/50ML-% IV SOLN
1.0000 g | Freq: Three times a day (TID) | INTRAVENOUS | Status: DC
  Administered 2018-02-09 – 2018-02-14 (×17): 1 g via INTRAVENOUS
  Filled 2018-02-09 (×19): qty 50

## 2018-02-09 MED ORDER — ALUM & MAG HYDROXIDE-SIMETH 200-200-20 MG/5ML PO SUSP
30.0000 mL | ORAL | Status: DC | PRN
  Filled 2018-02-09: qty 30

## 2018-02-09 MED ORDER — APIXABAN 5 MG PO TABS
5.0000 mg | ORAL_TABLET | Freq: Two times a day (BID) | ORAL | Status: DC
  Administered 2018-02-09 – 2018-02-14 (×11): 5 mg via ORAL
  Filled 2018-02-09 (×12): qty 1

## 2018-02-09 MED ORDER — CALCITRIOL 0.25 MCG PO CAPS
0.2500 ug | ORAL_CAPSULE | Freq: Every day | ORAL | Status: DC
  Administered 2018-02-09 – 2018-02-14 (×6): 0.25 ug via ORAL
  Filled 2018-02-09 (×6): qty 1

## 2018-02-09 MED ORDER — ONDANSETRON HCL 4 MG/2ML IJ SOLN
4.0000 mg | Freq: Three times a day (TID) | INTRAMUSCULAR | Status: DC | PRN
  Administered 2018-02-09 – 2018-02-14 (×8): 4 mg via INTRAVENOUS
  Filled 2018-02-09 (×7): qty 2

## 2018-02-09 MED ORDER — CLONIDINE HCL 0.1 MG PO TABS
0.3000 mg | ORAL_TABLET | Freq: Two times a day (BID) | ORAL | Status: DC
  Administered 2018-02-09 – 2018-02-14 (×12): 0.3 mg via ORAL
  Filled 2018-02-09 (×12): qty 3

## 2018-02-09 MED ORDER — CARVEDILOL 12.5 MG PO TABS
25.0000 mg | ORAL_TABLET | Freq: Two times a day (BID) | ORAL | Status: DC
  Administered 2018-02-09 – 2018-02-14 (×12): 25 mg via ORAL
  Filled 2018-02-09 (×6): qty 2
  Filled 2018-02-09: qty 1
  Filled 2018-02-09 (×5): qty 2

## 2018-02-09 MED ORDER — SENNA 8.6 MG PO TABS: 1.0000 | ORAL_TABLET | Freq: Every day | ORAL | Status: DC | PRN

## 2018-02-09 MED ORDER — TRAMADOL HCL 50 MG PO TABS
50.0000 mg | ORAL_TABLET | Freq: Four times a day (QID) | ORAL | Status: DC | PRN
  Administered 2018-02-09 – 2018-02-14 (×3): 50 mg via ORAL
  Filled 2018-02-09 (×3): qty 1

## 2018-02-09 MED ORDER — PROMETHAZINE HCL 25 MG/ML IJ SOLN
12.5000 mg | Freq: Four times a day (QID) | INTRAMUSCULAR | Status: DC | PRN
  Administered 2018-02-09: 12.5 mg via INTRAVENOUS

## 2018-02-09 MED ORDER — AMLODIPINE BESYLATE 5 MG PO TABS
5.0000 mg | ORAL_TABLET | Freq: Every day | ORAL | Status: DC
  Administered 2018-02-09 – 2018-02-14 (×6): 5 mg via ORAL
  Filled 2018-02-09 (×6): qty 1

## 2018-02-09 MED ORDER — ACETAMINOPHEN 650 MG RE SUPP: 650.0000 mg | Freq: Four times a day (QID) | RECTAL | Status: DC | PRN

## 2018-02-09 MED ORDER — INSULIN GLARGINE 100 UNIT/ML ~~LOC~~ SOLN
70.0000 [IU] | Freq: Every day | SUBCUTANEOUS | Status: DC
  Administered 2018-02-09 – 2018-02-14 (×6): 70 [IU] via SUBCUTANEOUS
  Filled 2018-02-09 (×6): qty 0.7

## 2018-02-09 NOTE — Consult Note (Signed)
Urology Consult  Consulting MD: Aldine Contes, MD  CC: Left testicular pain/swelling  HPI: This is a 43year old male with multiple medical issues.  He has been seen before in our office by Dr. Junious Silk for urinary frequency as well as nocturnal enuresis.  Urologic consultation is requested due to left testicular pain.  The patient states that within the past couple of days he had a mild testicular injury from moving his boxer shorts.  He noticed his testicle swelling and he had significant pain.  The patient had scrotal ultrasound today which revealed abnormalities of his left epididymis/testicle.  The patient was noted to have a urinary tract infection last week and was started on cephalosporin.  He was unable to finish this due to nausea and vomiting.  Culture sensitivities revealed adequate coverage from cephalosporins.  The patient denies prior testicular infections or trauma.  He rates his pain currently as a 7/8 out of 10. PMH: Past Medical History:  Diagnosis Date  . Acute venous embolism and thrombosis of deep vessels of proximal lower extremity ([place]) 07/19/2011  . Anemia   . Chest pain, neg MI, normal coronaries by cath 02/18/2013  . CKD (chronic kidney disease) stage 3, GFR 30-59 ml/min ([facility]) 02/19/2013  . Colon polyps   . Diabetic ulcer of right foot ([place])   . DVT (deep venous thrombosis) ([place]) 09/2002   patient reports additional DVTs in '06 & '11 (unconfirmed)  . GERD (gastroesophageal reflux disease)   . History of blood transfusion    "related to OR" (10/31/2016)  . Hyperlipidemia 02/19/2013  . Hypertension   . Nausea & vomiting    "constant for the last couple weeks" (10/31/2016)  . Nephrotic syndrome   . Obesity    BMI 44, weight 346 pounds 01/30/14  . Pulmonary embolism ([place]) 09/2002   treated with 6 months of warfarin  . Type I diabetes mellitus ([place]) dx'd 2001    PSH: Past Surgical History:  Procedure Laterality Date  . AMPUTATION Right 12/22/2013   Procedure: AMPUTATION BELOW KNEE;  Surgeon: [name], MD;  Location: [place];  Service: Orthopedics;  Laterality: Right;  . AMPUTATION Right 02/19/2014   Procedure: RIGHT ABOVE KNEE AMPUTATION ;  Surgeon: [name], MD;  Location: [place];  Service: Orthopedics;  Laterality: Right;  . CARDIAC CATHETERIZATION  02/18/2013   normal coronaries  . ESOPHAGOGASTRODUODENOSCOPY N/A 11/02/2016   Procedure: ESOPHAGOGASTRODUODENOSCOPY (EGD);  Surgeon: [name], MD;  Location[facility] ENDOSCOPY;  Service: Endoscopy;  Laterality: N/A;  . I&D EXTREMITY Right 12/14/2013   Procedure: IRRIGATION AND DEBRIDEMENT RIGHT FOOT;  Surgeon: [name], MD;  Location: [place];  Service: Orthopedics;  Laterality: Right;  . INCISION AND DRAINAGE ABSCESS  2007; 2015   "back"  . INCISION AND DRAINAGE OF WOUND Right 12/22/2013   Procedure: I&D RIGHT BUTTOCK;  Surgeon: [name], MD;  Location: [place];  Service: Orthopedics;  Laterality: Right;  . LEFT HEART CATHETERIZATION WITH CORONARY ANGIOGRAM N/A 02/18/2013   Procedure: LEFT HEART CATHETERIZATION WITH CORONARY ANGIOGRAM;  Surgeon: [name], MD;  Location: [facility] CATH LAB;  Service: Cardiovascular;  Laterality: N/A;    Allergies: Allergies  Allergen Reactions  . Reglan [Metoclopramide] Other (See Comments)    Dysphoric reaction    Medications: Medications Prior to Admission  Medication Sig Dispense Refill Last Dose  . ACCU-CHEK SOFTCLIX LANCETS lancets USE TO CHECK BLOOD SUGAR 5 TIMES DAILY. DX CODE E11.9. 100 each 3 Past Week at Unknown time  . allopurinol (  ZYLOPRIM) 100 MG tablet TAKE 2 TABLETS BY MOUTH ONCE DAILY (Patient taking differently: Take 200 mg by mouth daily. ) 180 tablet 3 prior to previous admission  . Alpha-Lipoic Acid 200 MG CAPS Take 1 capsule (200 mg total) by mouth daily. 30 capsule 2 Past Week at Unknown time  . amLODipine (NORVASC) 5 MG tablet TAKE 1 TABLET BY MOUTH ONCE DAILY (Patient taking differently: Take 5 mg by mouth daily. ) 30  tablet 11 prior to previous admission  . apixaban (ELIQUIS) 5 MG TABS tablet Take 1 tablet (5 mg total) by mouth 2 (two) times daily. 180 tablet 3 Past Week at Unknown time  . calcitRIOL (ROCALTROL) 0.25 MCG capsule TAKE 1 CAPSULE BY MOUTH ONCE DAILY (Patient taking differently: Take 0.25 mcg by mouth daily. ) 90 capsule 1 prior to previous admission  . carvedilol (COREG) 25 MG tablet TAKE 1 TABLET BY MOUTH TWICE DAILY (Patient taking differently: Take 25 mg by mouth 2 (two) times daily with a meal. ) 180 tablet 1 prior to previous admission  . cephALEXin (KEFLEX) 250 MG capsule Take 1 capsule (250 mg total) by mouth 3 (three) times daily for 4 days. 12 capsule 0 not yet taken  . cloNIDine (CATAPRES) 0.3 MG tablet Take 1 tablet (0.3 mg total) by mouth 2 (two) times daily. 60 tablet 11 prior to previous admission  . Cyanocobalamin (B-12) 2500 MCG SUBL Place 2,500 mcg under the tongue daily.   Past Week at Unknown time  . furosemide (LASIX) 40 MG tablet Take 2 tablets (80 mg total) by mouth 2 (two) times daily. 360 tablet 3 prior to previous admission  . gabapentin (NEURONTIN) 100 MG capsule Take 100 mg by mouth 3 (three) times daily.   Past Week at Unknown time  . glucose blood (ACCU-CHEK AVIVA PLUS) test strip 1 each by Other route 3 (three) times daily. Use as instructed to check blood sugar 3 times daily. DX:E11.65 100 each 3 Past Week at Unknown time  . hydrALAZINE (APRESOLINE) 50 MG tablet Take 50 mg by mouth 4 (four) times daily.   prior to previous admission  . Insulin Degludec 200 UNIT/ML SOPN Inject 140 Units into the skin daily. (Patient taking differently: Inject 130 Units into the skin daily. Tyler Aas) 45 pen 3 prior to previous admission  . Insulin Pen Needle (B-D UF III MINI PEN NEEDLES) 31G X 5 MM MISC USE  4 TIMES DAILY 200 each 7 Past Week at Unknown time  . Insulin Pen Needle (PEN NEEDLES 3/16") 31G X 5 MM MISC Use five times daily 390 each 3 Past Week at Unknown time  . Lancet Devices  (ACCU-CHEK SOFTCLIX) lancets Use to check blood sugars 5 times a day. Dx code:E11.9. Insulin dependent. 6 each 3 Past Week at Unknown time  . loratadine (CLARITIN) 10 MG tablet Take 10 mg by mouth daily as needed (seasonal allergies).    Past Week at Unknown time  . lovastatin (MEVACOR) 20 MG tablet TAKE 1 TABLET BY MOUTH ONCE DAILY (Patient taking differently: Take 20 mg by mouth daily at 6 PM. ) 90 tablet 3 prior to previous admission  . metolazone (ZAROXOLYN) 2.5 MG tablet Take 2.5 mg by mouth every other day.   prior to previous admission  . NOVOLOG FLEXPEN 100 UNIT/ML FlexPen INJECT 25 UNITS INTO THE SKIN THREE TIMES DAILY (Patient taking differently: 25 Units 3 (three) times daily with meals. ) 1 pen 5 prior to previous admission  . Omega-3 Fatty Acids (FISH OIL  PO) Take 1 capsule by mouth at bedtime.   Past Week at Unknown time  . oxybutynin (DITROPAN) 5 MG tablet Take 5 mg by mouth 3 (three) times daily.   Past Week at Unknown time  . polyethylene glycol (MIRALAX / GLYCOLAX) packet Take 17 g by mouth daily as needed for mild constipation.    Past Week at Unknown time  . senna-docusate (SENOKOT-S) 8.6-50 MG tablet Take 2 tablets by mouth daily as needed for mild constipation. 30 tablet 11 Past Week at Unknown time  . VICTOZA 18 MG/3ML SOPN INJECT 1.2MG  (0.2 MLS) SUBCUTANEOUSLY ONCE DAILY AT THE SAME TIME (Patient taking differently: Inject 1.2 mg into the skin daily. ) 6 mL 0 prior to previous admission  . zinc sulfate 220 (50 Zn) MG capsule Take 220 mg by mouth daily.   Past Week at Unknown time  . cyclobenzaprine (FLEXERIL) 5 MG tablet Take 1 tablet (5 mg total) by mouth 3 (three) times daily as needed for muscle spasms. (Patient not taking: Reported on 02/03/2018) 30 tablet 0 Not Taking at Unknown time  . insulin aspart (NOVOLOG) 100 UNIT/ML FlexPen Inject 25 Units into the skin 3 (three) times daily. (Patient not taking: Reported on 02/08/2018) 15 mL 11 Not Taking at Unknown time  .  promethazine (PHENERGAN) 12.5 MG tablet Take 1 tablet (12.5 mg total) by mouth every 8 (eight) hours as needed for nausea or vomiting. (Patient not taking: Reported on 02/08/2018) 15 tablet 0 Not Taking at Unknown time     Social History: Social History   Socioeconomic History  . Marital status: Single    Spouse name: Not on file  . Number of children: 0  . Years of education: 15.5  . Highest education level: Not on file  Occupational History  . Occupation: [employer]  . Occupation: Lexicographer: [employer]   Social Needs  . Financial resource strain: Not on file  . Food insecurity:    Worry: Not on file    Inability: Not on file  . Transportation needs:    Medical: Not on file    Non-medical: Not on file  Tobacco Use  . Smoking status: Never Smoker  . Smokeless tobacco: Never Used  Substance and Sexual Activity  . Alcohol use: Yes    Comment: 10/31/2016 "a few beers q 6 months or so"  . Drug use: No  . Sexual activity: Yes  Lifestyle  . Physical activity:    Days per week: Not on file    Minutes per session: Not on file  . Stress: Not on file  Relationships  . Social connections:    Talks on phone: Not on file    Gets together: Not on file    Attends religious service: Not on file    Active member of club or organization: Not on file    Attends meetings of clubs or organizations: Not on file    Relationship status: Not on file  . Intimate partner violence:    Fear of current or ex partner: Not on file    Emotionally abused: Not on file    Physically abused: Not on file    Forced sexual activity: Not on file  Other Topics Concern  . Not on file  Social History Narrative   Financial assistance approved for 100% discount at [facility] and has [facility] card per [employer]   09/01/2009.      Lives in [place] with mother and sister.  Family History: Family History  Problem Relation Age of Onset  . Heart disease Mother   . Diabetes Mother   .  Diabetes Maternal Uncle   . Diabetes Cousin     Review of Systems: Positive: Left testicular pain, swelling, some shakes and chills. Negative:   A further 10 point review of systems was negative except what is listed in the HPI.  Physical Exam: @VITALS2 @ General: No acute distress.  Awake. Head:  Normocephalic.  Atraumatic. ENT:  EOMI.  Mucous membranes moist CV:  Regular rate. Pulmonary: Equal effort bilaterally.   Abdomen: Obese, soft, nontender.. Skin:  Normal turgor.  No visible rash. Extremity: No gross deformity of upper extremities.  No gross deformity of lower extremities. Neurologic: Alert. Appropriate mood.  Penis:  No lesions. Urethra: Orthotopic meatus. Scrotum: No lesions.  No ecchymosis.  No erythema. Testicles: Descended bilaterally.  Right testicle slightly atrophic, left testicle normal to palpation. Epididymis: Palpable bilaterally.  Both epidermides located posteriorly.  There is significant induration/tenderness of the left epididymis.  Studies:  Recent Labs    02/08/18 1846 02/09/18 0422  HGB 10.2* 8.8*  WBC 13.7* 13.2*  PLT 478* 455*    Recent Labs    02/08/18 1846 02/09/18 0422  NA 141 141  K 4.3 4.1  CL 105 105  CO2 24 24  BUN 59* 61*  CREATININE 2.46* 2.69*  CALCIUM 9.2 9.0  GFRNONAA 31* 28*  GFRAA 36* 33*     No results for input(s): INR, APTT in the last 72 hours.  Invalid input(s): PT   Invalid input(s): ABG    Assessment: Left testicular pain/swelling.  More than likely, with the patient's inadequately treated Klebsiella UTI, as well as the posterior location of this induration, he has epididymitis.  There is some possibility that this may be a hematoma with the patient being on antiplatelet therapy.  Plan: At this point, no direct surgical intervention is necessary.  I would suggest tight briefs, a heating pad to the area, and adequate antibiotic management i.e. cephalosporins.  Unfortunately, with his CKD, he is not a  candidate for anti-inflammatories, which would significantly improve his pain and swelling.  However, narcotic administration at this point should be fine.  I would expect him to have gradual resolution of this process.  He does have an appointment to see Dr. Junious Silk in our office in approximately 3 weeks.  If he needs further urologic assistance during this hospitalization, please call.    [phone]

## 2018-02-09 NOTE — Plan of Care (Signed)
  Problem: Education: Goal: Knowledge of General Education information will improve Description Including pain rating scale, medication(s)/side effects and non-pharmacologic comfort measures Outcome: Progressing   Problem: Health Behavior/Discharge Planning: Goal: Ability to manage health-related needs will improve Outcome: Progressing   

## 2018-02-09 NOTE — Plan of Care (Signed)
  Problem: Nutrition: Goal: Adequate nutrition will be maintained Outcome: Progressing   

## 2018-02-09 NOTE — ED Notes (Signed)
Iv removed unable to  Get it to run  Iv team called

## 2018-02-09 NOTE — ED Notes (Signed)
Pt went to c-t and returned

## 2018-02-09 NOTE — Plan of Care (Signed)
  Problem: Education: Goal: Knowledge of General Education information will improve Description: Including pain rating scale, medication(s)/side effects and non-pharmacologic comfort measures Outcome: Progressing   Problem: Nutrition: Goal: Adequate nutrition will be maintained Outcome: Progressing   

## 2018-02-09 NOTE — Progress Notes (Signed)
Patient on full liquid diet with accuchek=53 mg/dl. A & O, asymptomatic.Juice given per protocol and recheck accuchek= 102 mg/dl. Patient without complaints

## 2018-02-09 NOTE — ED Notes (Signed)
To ultrasound

## 2018-02-09 NOTE — ED Notes (Signed)
Report called to rn on 5 c 

## 2018-02-09 NOTE — Progress Notes (Signed)
Patient arrived to the unit alert and oriented X 4 C/o abdominal Pain Rated at a 8. Nurse will treat with PRN medication Patient has a right AKA All questions and concerns addressed  Bed in the lowest position with the bed alarm set. Call light in reach

## 2018-02-09 NOTE — ED Notes (Signed)
Iv team at bedside  

## 2018-02-09 NOTE — ED Provider Notes (Signed)
Complains of vomiting onset 2 days ago vomiting 5 or 6 episodes per day.  He also complains of diffuse abdominal pain and pain in left testicle for the past 3 days.  On exam abdomen is obese, diffusely tender.  Left testicle is tender posteriorly.  Testes in normal lie.  There is no evidence of hernia   Orlie Dakin, MD 02/09/18 0010

## 2018-02-09 NOTE — Progress Notes (Signed)
Subjective:  [name] is a 43 y.o. with PMH of DVT, CAD, IDDM, HTN, CKD3, OSA, and hx of L AKA admit for nausea and vomiting on hospital day 0  [name] was examined and evaluated at bedside this AM. He was observed resting comfortably in bed. He states after discharge he was unable to tolerate solid oral intake due to nausea and vomiting. He was able to tolerate fluids and have been throwing up NBNB vomitus. He mentions he started experiencing scrotal pain on the day of discharge from prior hospitalization (02/05/18) when his testicle got caught on his boxer. Since then the pain has stayed consistent with associated abdominal pain. He mentions he noted some black, tarry stools for the first time noted yesterday. Denis any fevers, chills, dysuria, chest pain, palpitations, diarrhea overnight. States he would be willing to try advancing his diet today.   Objective:  Vital signs in last 24 hours: Vitals:   02/08/18 2130 02/09/18 0129 02/09/18 0205 02/09/18 0635  BP: (!) 188/79 (!) 189/90 (!) 169/99 (!) 149/74  Pulse: 86 92 88 85  Resp:  20 20 18   Temp:   98.3 F (36.8 C) 98.2 F (36.8 C)  TempSrc:   Oral Oral  SpO2: 100% 97% 98% 99%  Weight:   (!) 164 kg   Height:   6\' 2"  (1.88 m)    Gen: Morbidly obese, NAD HEENT:  hearing intact, EOMI, PERRL, MMM CV: RRR, S1, S2 normal, No rubs, no murmurs, no gallops Pulm: CTAB, No rales, no wheezes, no dullness to percussion  Abd: Soft, BS+, Diffuse abdominal tenderness. Significant tenderness to palpation on LLQ. Denies rebound tenderness. GU: Enlarged left-sided scrotum with normal testicle but palpable extra-testicular soft mass with tenderness on palpation without erythema or warmth. Right side appear normal. Extm: ROM intact, Peripheral pulses intact, Trace pitting edema on left ankle. Right leg s/p BKA Skin: Dry, Warm, normal turgor, no rashes, lesions, wounds.  Neuro: AAOx3  Assessment/Plan:  Active Problems:   Nausea and  vomiting  [name] is 43 yo M w/ PMH of DVT, CAD, IDDM, HTN, CKD3, OSA, and hx of L AKA admit for intractable nausea and vomiting. He had recent admission for complicated UTI with Keflex at discharge which he was unable to complete. Now presenting with new onset scrotal pain found to have a mass on ultrasound. Nausea and vomiting possibly due to epididymitis vs recurrent UTI. Will treat with abx and consult urology. Also will need work-up for his anemia and black-tarry stool.   Nausea and vomiting due to recurrent UTI vs gastroparesis vs viral gastroenteritis Presenting NBNB emesis 4 times daily. Did not finish abx course for Kleb UTI. New onset scrotal pain with complex mass/fluid collection found on scrotal ultrasound. Acute onset in 3 days w/o sick contact. Started on Ancef yesterday. No ketones on UA. Abd CT show sign of cholethiasis w/o cholecystitis and no hydronephrosis or nephrolithiasis or colitis. Was able to tolerate clears overnight. - C/w cefazolin 1g q8hr - Zofran 4mg  IV q8hr PRN for nausea/vomiting - Advance diet as tolerated - currently on full liquid  L sided Scrotal Pain 2/2 scrotal abscess vs trauma vs epididymitis Occurred after scrotum got caught on his boxers 3 days ago. Denies any dysuria. Enlarged left sided scrotum on exam. US show 3.1x2.6x1.5cm complex fluid collection or mass possibly hematoma vs abscess vs fluid collection. Testicular torsion ruled out. Follows with [facility]. - Urology consult - will see pt sometime today  Leukocytosis 2/2 recurrent UTI Previous  admission (d/c date 02/05/18) with findings on Kleb pneumo sensitive to cefazolin. Completed 3/7 during inpatient. Was unable to tolerate oral medication after discharge due to nausea and vomiting. Had prior hx of incomplete course with doxy earlier this month. Possibility of resistance is high. UA show small leukocytes, rare bacteria, no nitrates. WBC 13.7->13.2 - F/u urine culture - C/w cefazolin 1g q8hr   - Trend CBC  AKI on CKD 3 2/2 Dehydration Current creatinine 2.46->2.69. Baseline 2.2. Most like pre-renal due to poor fluid intake and nausea/vomiting. No evidence of hydronephrosis on CT abd. - Trend renal function - C/w calcitriol for secondary hypocalcemia - Avoid nephrotoxic meds  Chronic normocytic anemia 2/2 renal disease vs GI bleed Hgb 10.2->8.8 after fluid bolus. Endorsing black tarry stools. Hx of 0.5cm semi-pedunculated polyp in 2017 colonoscopy. - F/u FOBT (awaiting bowel movement), iron, TIBC, ferritin, iron sat - Trend CBC   IDDM Fasting bg 168. Last hgb a1c 8.7 09/2017. Home meds: Tresiba 140 units daily, Victoza 1.2mg  daily, Novolog 25 Unit TID - Glucose checks - SSI - Lantus 70 units daily  Hx of HFpEF Home meds: furosemide 80mg  BID, metolazone 2.5mg  every other day. Will hold diuretics in setting of current AKI and dehydration. - Mag level - Strict I&Os - Daily Weights  Hx of Gout - C/w home med: allopurinol 200mg  daily  Hypertension BP 149/74 currently in acute pain - C/w home meds: amlodipine 5mg  daily, hydralazine 50mg  4 times daily, clonidine 0.3mg  BID  DVT prophx: Eliquis Diet: Full-liquid Bowel: Senokot Code: Full  Dispo: Anticipated discharge in approximately 2-3 day(s).   Victor Anis, MD 02/09/2018, 10:21 AM Pager: [phone]

## 2018-02-10 LAB — BASIC METABOLIC PANEL
Anion gap: 9 (ref 5–15)
BUN: 58 mg/dL — ABNORMAL HIGH (ref 6–20)
CO2: 24 mmol/L (ref 22–32)
Calcium: 8.4 mg/dL — ABNORMAL LOW (ref 8.9–10.3)
Chloride: 104 mmol/L (ref 98–111)
Creatinine, Ser: 2.78 mg/dL — ABNORMAL HIGH (ref 0.61–1.24)
GFR calc Af Amer: 31 mL/min — ABNORMAL LOW (ref >60)
GFR calc non Af Amer: 27 mL/min — ABNORMAL LOW (ref >60)
Glucose, Bld: 66 mg/dL — ABNORMAL LOW (ref 70–99)
Potassium: 3.9 mmol/L (ref 3.5–5.1)
SODIUM: 137 mmol/L (ref 135–145)

## 2018-02-10 LAB — CBC
HCT: 29.8 % — ABNORMAL LOW (ref 39.0–52.0)
Hemoglobin: 8.9 g/dL — ABNORMAL LOW (ref 13.0–17.0)
MCH: 27.1 pg (ref 26.0–34.0)
MCHC: 29.9 g/dL — ABNORMAL LOW (ref 30.0–36.0)
MCV: 90.9 fL (ref 80.0–100.0)
Platelets: 415 10*3/uL — ABNORMAL HIGH (ref 150–400)
RBC: 3.28 MIL/uL — ABNORMAL LOW (ref 4.22–5.81)
RDW: 14.6 % (ref 11.5–15.5)
WBC: 9.3 10*3/uL (ref 4.0–10.5)
nRBC: 0 % (ref 0.0–0.2)

## 2018-02-10 LAB — GLUCOSE, CAPILLARY
Glucose-Capillary: 54 mg/dL — ABNORMAL LOW (ref 70–99)
Glucose-Capillary: 111 mg/dL — ABNORMAL HIGH (ref 70–99)
Glucose-Capillary: 66 mg/dL — ABNORMAL LOW (ref 70–99)
Glucose-Capillary: 140 mg/dL — ABNORMAL HIGH (ref 70–99)
Glucose-Capillary: 152 mg/dL — ABNORMAL HIGH (ref 70–99)
Glucose-Capillary: 179 mg/dL — ABNORMAL HIGH (ref 70–99)

## 2018-02-10 LAB — URINE CULTURE: Culture: NO GROWTH

## 2018-02-10 LAB — MAGNESIUM: MAGNESIUM: 2.6 mg/dL — AB (ref 1.7–2.4)

## 2018-02-10 NOTE — Progress Notes (Signed)
Subjective:  [name] is a 43 y.o. with PMH of DVT, CAD, IDDM, HTN, CKD3, OSA, and hx of L AKA admit for nausea and vomiting. Hospital day 1  [name] was seen at bedside this AM. He expressed some frustration over his repeat admissions and state he wants to make sure he is better before heading home. His nausea is improved, but he states he has not had much to eat so far. He can tolerate liquids and was encouraged to drink given his elevated Cr. His Scrotal Pain is stable, he states he spoke with the urologist yesterday who informed him of his likely epididymitis. Denis any fevers, chills, dysuria, chest pain, palpitations, diarrhea.   Objective:  Vital signs in last 24 hours: Vitals:   02/09/18 1535 02/09/18 1931 02/09/18 2330 02/10/18 0355  BP: (!) 147/78 135/73 108/64 125/71  Pulse: 74 75 71 72  Resp: 20 18 19 16   Temp: 97.7 F (36.5 C) 98.9 F (37.2 C) 98.7 F (37.1 C) 98.8 F (37.1 C)  TempSrc: Oral Oral Oral Oral  SpO2: 97% 96% 97% 95%  Weight:      Height:       Gen: Morbidly obese, NAD HEENT:  hearing intact, EOMI, PERRL, MMM CV: RRR, S1, S2 normal, No rubs, no murmurs, no gallops Pulm: CTAB, No rales, no wheezes, no dullness to percussion  Abd: Soft, BS+, Non-tender to light palpation Extm: ROM intact, Peripheral pulses intact, Trace pitting edema on left ankle. Right leg s/p BKA Skin: Dry, Warm, normal turgor, no rashes, lesions, wounds.  Neuro: AAOx3  Assessment/Plan:  Active Problems:   Nausea and vomiting   Intractable nausea and vomiting  [name] is 43 yo M w/ PMH of DVT, CAD, IDDM, HTN, CKD3, OSA, and hx of L AKA admit for intractable nausea and vomiting. He had recent admission for complicated UTI with Keflex at discharge which he was unable to complete. Now presenting with new onset scrotal pain found to have a mass on ultrasound. Nausea and vomiting possibly due to epididymitis vs recurrent UTI. Will treat with abx and consult urology. Also will  need work-up for his anemia and black-tarry stool.   Nausea and vomiting due to recurrent UTI vs gastroparesis vs viral gastroenteritis Presenting NBNB emesis 4 times daily. Did not finish abx course for Kleb UTI. New onset scrotal pain with complex mass/fluid collection found on scrotal ultrasound, epidymidis suspected. Acute onset in 3 days w/o sick contact. Ancef 1/31. No ketones on UA. Abd CT w/ sign of cholethiasis w/o cholecystitis; no hydronephrosis, nephrolithiasis, nor colitis. Tolerating full liquid diet okay, has not tried to eat much. - C/w cefazolin 1g q8hr - Zofran 4mg  IV q8hr PRN for nausea/vomiting - Advance diet as tolerated  L sided Scrotal Pain, Epididymitis, Possible Hematoma Occurred after scrotum got caught on his boxers 3 days ago. Denies any dysuria. Enlarged left sided scrotum on exam. US show 3.1x2.6x1.5cm complex fluid collection or mass possibly hematoma vs abscess vs fluid collection. Testicular torsion ruled out. Follows with [facility]. - Appreciate Urology Recs: Suspect epididymitis - Continue Cefazolin as above  Leukocytosis 2/2 recurrent UTI Previous admission (d/c date 02/05/18) with findings on Kleb pneumo sensitive to cefazolin. Completed 3/7 during inpatient. Was unable to tolerate oral medication after discharge due to nausea and vomiting. Had prior hx of incomplete course with doxy earlier this month. Possibility of resistance is high. UA show small leukocytes, rare bacteria, no nitrates. WBC 13.7->13.2 -> 9.3 - F/u urine culture - C/w  cefazolin 1g q8hr   AKI on CKD 3 2/2 Dehydration Current creatinine 2.46->2.69->2.78. Baseline 2.2. Most like pre-renal due to poor fluid intake and nausea/vomiting. No evidence of hydronephrosis on CT abd. - Trend renal function - C/w calcitriol for secondary hypocalcemia - Avoid nephrotoxic meds - Encourage PO hydration - Holding home diuretics  Chronic normocytic anemia 2/2 renal disease vs GI bleed Hgb  10.2->8.8 after fluid bolus. Endorsing black tarry stools. Hx of 0.5cm semi-pedunculated polyp in 2017 colonoscopy. - F/u FOBT (awaiting bowel movement), iron, TIBC, ferritin, iron sat - Trend CBC   IDDM: Last A1c 8.7 (09/2017). Home meds: Tresiba 140 units daily, Victoza 1.2mg  daily, Novolog 25 Unit TID. Low early 2/2, will monitor with diet advancement - Glucose checks - Lantus 70 units daily - SSI  Hx of HFpEF Home meds: furosemide 80mg  BID, metolazone 2.5mg  every other day. > Will hold diuretics in setting of current AKI and dehydration. - Mag level - Strict I&Os - Daily Weights  Hypertension BP 125/71  - C/w home meds: amlodipine 5mg  daily, hydralazine 50mg  4 times daily, clonidine 0.3mg  BID  Hx of Gout - C/w home med: allopurinol 200mg  daily  DVT prophx: Eliquis Diet: Full-liquid Bowel: Senokot Code: Full  Dispo: Anticipated discharge in approximately 1-2 day(s).   Neva Seat, MD 02/10/2018, 6:48 AM

## 2018-02-11 ENCOUNTER — Encounter (HOSPITAL_COMMUNITY): Payer: Self-pay | Admitting: Internal Medicine

## 2018-02-11 ENCOUNTER — Ambulatory Visit: Payer: Medicare Other

## 2018-02-11 DIAGNOSIS — E860 Dehydration: Secondary | ICD-10-CM

## 2018-02-11 DIAGNOSIS — D649 Anemia, unspecified: Secondary | ICD-10-CM

## 2018-02-11 DIAGNOSIS — S78111A Complete traumatic amputation at level between right hip and knee, initial encounter: Secondary | ICD-10-CM

## 2018-02-11 DIAGNOSIS — Z794 Long term (current) use of insulin: Secondary | ICD-10-CM

## 2018-02-11 DIAGNOSIS — R1110 Vomiting, unspecified: Secondary | ICD-10-CM

## 2018-02-11 DIAGNOSIS — E119 Type 2 diabetes mellitus without complications: Secondary | ICD-10-CM

## 2018-02-11 DIAGNOSIS — N451 Epididymitis: Secondary | ICD-10-CM

## 2018-02-11 LAB — BASIC METABOLIC PANEL
Anion gap: 9 (ref 5–15)
BUN: 63 mg/dL — ABNORMAL HIGH (ref 6–20)
CHLORIDE: 104 mmol/L (ref 98–111)
CO2: 25 mmol/L (ref 22–32)
CREATININE: 2.81 mg/dL — AB (ref 0.61–1.24)
Calcium: 8.3 mg/dL — ABNORMAL LOW (ref 8.9–10.3)
GFR calc Af Amer: 31 mL/min — ABNORMAL LOW (ref >60)
GFR calc non Af Amer: 27 mL/min — ABNORMAL LOW (ref >60)
Glucose, Bld: 142 mg/dL — ABNORMAL HIGH (ref 70–99)
Potassium: 4.2 mmol/L (ref 3.5–5.1)
Sodium: 138 mmol/L (ref 135–145)

## 2018-02-11 LAB — GLUCOSE, CAPILLARY
Glucose-Capillary: 152 mg/dL — ABNORMAL HIGH (ref 70–99)
Glucose-Capillary: 114 mg/dL — ABNORMAL HIGH (ref 70–99)
Glucose-Capillary: 145 mg/dL — ABNORMAL HIGH (ref 70–99)
Glucose-Capillary: 147 mg/dL — ABNORMAL HIGH (ref 70–99)

## 2018-02-11 LAB — CBC
HCT: 30.3 % — ABNORMAL LOW (ref 39.0–52.0)
Hemoglobin: 9.1 g/dL — ABNORMAL LOW (ref 13.0–17.0)
MCH: 27.4 pg (ref 26.0–34.0)
MCHC: 30 g/dL (ref 30.0–36.0)
MCV: 91.3 fL (ref 80.0–100.0)
Platelets: 435 10*3/uL — ABNORMAL HIGH (ref 150–400)
RBC: 3.32 MIL/uL — ABNORMAL LOW (ref 4.22–5.81)
RDW: 14.6 % (ref 11.5–15.5)
WBC: 8.9 10*3/uL (ref 4.0–10.5)
nRBC: 0 % (ref 0.0–0.2)

## 2018-02-11 MED ORDER — SENNOSIDES-DOCUSATE SODIUM 8.6-50 MG PO TABS
1.0000 | ORAL_TABLET | Freq: Every day | ORAL | Status: DC
  Administered 2018-02-11 – 2018-02-13 (×3): 1 via ORAL
  Filled 2018-02-11 (×3): qty 1

## 2018-02-11 MED ORDER — POLYETHYLENE GLYCOL 3350 17 G PO PACK
17.0000 g | PACK | Freq: Every day | ORAL | Status: DC
  Administered 2018-02-12 – 2018-02-14 (×3): 17 g via ORAL
  Filled 2018-02-11 (×4): qty 1

## 2018-02-11 MED ORDER — LACTATED RINGERS IV SOLN
INTRAVENOUS | Status: AC
  Administered 2018-02-11: 17:00:00 via INTRAVENOUS

## 2018-02-11 NOTE — Progress Notes (Signed)
Subjective:  [name] is a 43 y.o. with PMH of DVT, CAD, IDDM, HTN, CKD3, OSA, and hx of L AKA admit for nausea and vomiting on hospital day 2  [name] was examined and evaluated at bedside this AM. He was observed sleeping comfortably in bed. He states he slept well and enjoyed the [employer] last night. He is continuing to endorse loss of appetitie and had couple bites of his food last night but states not back to baseline. Had no further episodes of severe nausea or vomiting. He is continuing to endorse abdominal and scrotal pain but states is improved compared to prior. His last bowel movement was on Friday and he has not had any more bowel movement since so nursing staff has been unable to collect sample for testing for blood. He states he will let the nursing staff aware when he has a bowel movement.  Objective:  Vital signs in last 24 hours: Vitals:   02/10/18 2355 02/11/18 0304 02/11/18 0500 02/11/18 0747  BP: 127/64 132/62  129/67  Pulse: 76 74  71  Resp: 18 18  18   Temp: 98 F (36.7 C) 98.3 F (36.8 C)  98.9 F (37.2 C)  TempSrc: Oral Oral  Oral  SpO2: 100% 98%  100%  Weight:   (!) 174.4 kg   Height:       Gen: Morbidly obese, NAD Neck: supple, ROM intact, no JVD, no cervical adenopathy CV: RRR, S1, S2 normal, No rubs, no murmurs, no gallops Pulm: CTAB, No rales, no wheezes  Abd: Soft, BS+, Tender to palpation on LLQ, No rebound, No guarding GU: Enlarged scrotum without erythema or warmth. Appear smaller in size compared to prior exam. Extm: S/p R BKA, Peripheral pulses intact, Trace pitting edema around left ankle Skin: Dry, Warm, normal turgor, no rashes, lesions, wounds.  Neuro: AAOx3,  Psych: Normal mood and affect  Assessment/Plan:  Active Problems:   Nausea and vomiting   Intractable nausea and vomiting   [name] is 43 yo M w/ PMH of DVT, CAD, IDDM, HTN, CKD3, OSA, and hx of L AKA admit for intractable nausea and vomiting. Nausea and vomiting  appears to be improving and leukocytosis has resolved. He should be ready for discharge soon once his pain is well controlled and able to tolerate full meals.  Nausea and vomiting due to recurrent UTI vs gastroparesis vs viral gastroenteritis Presenting NBNB emesis 4 times daily. Did not finish abx course for Kleb UTI sensitive to sefazolin. New onset scrotal pain with complex mass/fluid collection found on scrotal ultrasound, epidymidis suspected. Acute onset in 3 days w/o sick contact. Ancef 1/31. No ketones on UA but show small leukocytes, rare bacteria. Abd CT w/ sign of cholethiasis w/o cholecystitis; no hydronephrosis, nephrolithiasis, nor colitis. UA culture negative so far. WBC 13.2->9.3->8.9 - F/u urine cultures (NGTD day 3) - Zofran 4mg  IV q8hr PRN for nausea/vomiting - C/w cefazolin 1g q8hr day 3/7 (end-date 02/15/18)  L sided Scrotal Pain, Epididymitis, Possible Hematoma Occurred after scrotum got caught on his boxers 3 days ago. Denies any dysuria. Enlarged left sided scrotum on exam. US show 3.1x2.6x1.5cm complex fluid collection or mass possibly hematoma vs abscess vs fluid collection. Testicular torsion ruled out. Follows with [facility]. Appears to be improving - Appreciate Urology Recs: Suspect epididymitis continue abx and supportive therapy - Tramadol 50mg  q6hr PRN for pain - Scheduled senokot, miralax for opioid related constipation - Continue Cefazolin as above  AKI on CKD 3 2/2 Dehydration Current  creatinine 2.46->2.69->2.78->2.81. Baseline 2.2. Continuing to have rising creatine despite resolution of nausea, vomiting. Will give him some fluid back - Trend renal function - C/w calcitriol for secondary hypocalcemia - Avoid nephrotoxic meds - LR 100cc/hr for 12 hours - Encourage PO intake  Chronic normocytic anemia 2/2 renal disease vs GI bleed Hgb stable at 9.1. Endorse black tarry stool x1 prior to admission but no further stools since hospitalization. Hx of  0.5cm semi-pedunculated polyp in 2017 colonoscopy. Ferritin 850 Iron 43, TIBC 196, Iron Sat 22% - F/u FOBT (awaiting bowel movement) - Trend CBC   IDDM: Last A1c 8.7 (09/2017). Home meds: Tresiba 140 units daily, Victoza 1.2mg  daily, Novolog 25 Unit TID. Fasting bg 152 this AM. Required 7 units insulin aspart yesterday. - Glucose checks - Lantus 70 units daily - Novolog 3 units qc - SSI  Hx of HFpEF Home meds: furosemide 80mg  BID, metolazone 2.5mg  every other day. - Holding home diuretics in setting of AKI - Strict I&Os - Daily Weights  DVT prophx: Eliquis Diet: Diabetic Bowel: Senokot, Miralax Code: Full  Dispo: Anticipated discharge in approximately 1-2 day(s).   Mosetta Anis, MD 02/11/2018, 10:39 AM Pager: [phone]

## 2018-02-11 NOTE — Progress Notes (Signed)
Medicine attending: I examined this patient today together with resident physician Dr. Gilberto Better and I concur with his evaluation and management plan. 43 year old man with type 2 insulin-dependent diabetes currently being treated for a Klebsiella urinary tract infection and acute epididymitis with parenteral cefazolin.  Scrotal pain is decreasing.  He has chronic problems with nausea and intermittent vomiting.  This is most likely related to his diabetes, gastropathy, and not to his other acute problems.  He did stop vomiting but is still nauseous and has not yet been able to eat a full meal.  Abdomen is soft, obese, nontender.  Decreased scrotal swelling compared with other examiners who have seen him before.  Note right above the knee amputation done about 4 years ago due to complications from diabetes. Continue current management. Gastric emptying study if persistent nausea.  Consider trial of Reglan.  With respect to his chronic renal insufficiency, transient rise in creatinine from baseline 2.3 recorded in September 2019 tp peak at 3.8 this admission.  Currently 2.8 with estimated GFR 31.  This needs ongoing follow-up in view of the fact that he is on full dose Eliquis.  Need to consider dose reduction unless renal function improves back to his baseline.

## 2018-02-12 LAB — GLUCOSE, CAPILLARY
GLUCOSE-CAPILLARY: 50 mg/dL — AB (ref 70–99)
Glucose-Capillary: 84 mg/dL (ref 70–99)
Glucose-Capillary: 151 mg/dL — ABNORMAL HIGH (ref 70–99)
Glucose-Capillary: 135 mg/dL — ABNORMAL HIGH (ref 70–99)
Glucose-Capillary: 157 mg/dL — ABNORMAL HIGH (ref 70–99)

## 2018-02-12 LAB — BASIC METABOLIC PANEL
Anion gap: 8 (ref 5–15)
BUN: 55 mg/dL — ABNORMAL HIGH (ref 6–20)
CHLORIDE: 108 mmol/L (ref 98–111)
CO2: 23 mmol/L (ref 22–32)
Calcium: 8.4 mg/dL — ABNORMAL LOW (ref 8.9–10.3)
Creatinine, Ser: 2.45 mg/dL — ABNORMAL HIGH (ref 0.61–1.24)
GFR calc Af Amer: 37 mL/min — ABNORMAL LOW (ref >60)
GFR calc non Af Amer: 32 mL/min — ABNORMAL LOW (ref >60)
Glucose, Bld: 58 mg/dL — ABNORMAL LOW (ref 70–99)
Potassium: 4.2 mmol/L (ref 3.5–5.1)
SODIUM: 139 mmol/L (ref 135–145)

## 2018-02-12 MED ORDER — RAMELTEON 8 MG PO TABS
8.0000 mg | ORAL_TABLET | Freq: Once | ORAL | Status: AC
  Administered 2018-02-13: 8 mg via ORAL
  Filled 2018-02-12: qty 1

## 2018-02-12 NOTE — Progress Notes (Signed)
Subjective:  [name] is a 43 y.o. with PMH of DVT, CAD, IDDM, HTN, CKD3, OSA, and hx of L AKA admit for nausea and vomiting on hospital day 3  [name] was examined and evaluated at bedside this AM. He was observed resting comfortably in bed. He states his nausea, vomiting and abdominal pain has all been improving. He states his abdominal pain appear to be more suprapubic now. He had some dinner last night and was able to keep it down. He had an episode of hypoglycemia early morning which he attributes to decreased oral intake. He states that he still has not yet had a bowel movement despite taking both senokot and miralax. He denies any F/N/V/D/C. Marland Kitchen  Objective:  Vital signs in last 24 hours: Vitals:   02/12/18 0000 02/12/18 0407 02/12/18 0500 02/12/18 0800  BP: 129/66 (!) 143/78  (!) 142/68  Pulse: 92 70  69  Resp: 18 18  18   Temp: 98.9 F (37.2 C) 98.3 F (36.8 C)  98.5 F (36.9 C)  TempSrc: Oral Oral  Oral  SpO2: 97% 98%  100%  Weight:   (!) 173 kg   Height:       Gen: Morbidly obese, NAD Neck: supple, ROM intact, no JVD, no cervical adenopathy CV: RRR, S1, S2 normal, No rubs, no murmurs, no gallops Pulm: CTAB, no rales, no wheezes Abd: Soft, BS+, Suprapubic tenderness to deep palpation, No rebound, No guarding GU: Enlarged scrotum without erythema or warmth. Extm: S/p R BKA, Peripheral pulses intact, Trace pitting edema around left ankle Skin: Dry, Warm, normal turgor, no rashes, lesions, wounds.  Neuro: AAOx3,  Psych: Normal mood and affect  Assessment/Plan:  Active Problems:   Insulin dependent type 2 diabetes mellitus, controlled (HCC)   Chronic renal insufficiency, stage 3 (moderate) (HCC)   Unilateral AKA, right (HCC)   Normocytic anemia   Blood in stool   Complicated UTI (urinary tract infection)   AKI (acute kidney injury) (HCC)   Intractable nausea and vomiting   Dehydration   Intractable vomiting   Epididymitis   [name] is 43 yo M w/ PMH of  DVT, CAD, IDDM, HTN, CKD3, OSA, and hx of L AKA admit for intractable nausea and vomiting. Continuing to have improvement in nausea and vomiting but not at baseline. Had 1 episode of hypoglycemia last night despite being on significantly lower dose of insulin compared to his home dose. If he is unable to continue diet, may need gastric emptying study for diabetic gastroparesis.  Nausea and vomiting due to recurrent UTI vs gastroparesis vs viral gastroenteritis UA culture negative on final read. Nausea and vomiting appear to be improving. Will consider gastric emptying studies if continuing to not improve. - Zofran 4mg  IV q8hr PRN for nausea/vomiting - C/w cefazolin 1g q8hr day 4/7 (end-date 02/15/18)  L sided Scrotal Pain, Epididymitis, Possible Hematoma Occurred after scrotum got caught on his boxers 3 days ago. Denies any dysuria. Enlarged left sided scrotum on exam. US show 3.1x2.6x1.5cm complex fluid collection or mass possibly hematoma vs abscess vs fluid collection. Testicular torsion ruled out. Follows with [facility]. Continuing to improve. - Appreciate Urology Recs: Suspect epididymitis continue abx and supportive therapy - Tramadol 50mg  q6hr PRN for pain - Continue Cefazolin as above  AKI on CKD 3 2/2 Dehydration Renal function improving with 1.2L LR yesterday. Current creatinine 2.78->2.81->2.45. Baseline 2.2.  - Trend renal function - C/w calcitriol for secondary hypocalcemia - Avoid nephrotoxic meds - Encourage PO intake  Chronic  normocytic anemia 2/2 renal disease vs GI bleed Hgb stable at 9.1. Endorse black tarry stool x1 prior to admission but no further stools since hospitalization. Hx of 0.5cm semi-pedunculated polyp in 2017 colonoscopy. Ferritin 850 Iron 43, TIBC 196, Iron Sat 22% - F/u FOBT (awaiting bowel movement) - Scheduled senokot, miralax for opioid related constipation - Trend CBC   IDDM: Last A1c 8.7 (09/2017). Home meds: Tresiba 140 units daily, Victoza  1.2mg  daily, Novolog 25 Unit TID. Episode of hypoglycemia down to 50 yesterday. Required 3 units insulin aspart yesterday. - Glucose checks - Lantus 70 units daily - Novolog 3 units qc - SSI  Hx of HFpEF Home meds: furosemide 80mg  BID, metolazone 2.5mg  every other day. - Holding home diuretics in setting of AKI - Strict I&Os - Daily Weights  DVT prophx: Eliquis Diet: Diabetic Bowel: Senokot, Miralax Code: Full  Dispo: Anticipated discharge in approximately 1-2 day(s).   Mosetta Anis, MD 02/12/2018, 10:51 AM Pager: [phone]

## 2018-02-12 NOTE — Progress Notes (Signed)
Medicine attending:  I examined this patient today together with resident physician Dr. Gilberto Better and I concur with his evaluation and management plan. GI and GU symptoms are slowly resolving.  Abdomen no longer tender.  Nausea without further vomiting.  He is constipated and laxatives were administered in addition to parenteral hydration. Blood sugars running low in view of decreased oral intake.  Insulin regimen adjusted. We are still considering doing a gastric emptying study to document diabetic gastropathy.  He may benefit from a trial of Reglan.

## 2018-02-12 NOTE — Progress Notes (Signed)
Pt cbg was 50, 4oz of orange juice and some sugar given, reassessed 65minutes later as 84. Will continue to monitor.

## 2018-02-12 NOTE — Care Management Important Message (Signed)
Important Message  Patient Details  Name: Elisah Parmer MRN: [id number] Date of Birth: 13-May-1975   Medicare Important Message Given:  Yes    Josanne Boerema 02/12/2018, 3:28 PM

## 2018-02-13 ENCOUNTER — Telehealth: Payer: Self-pay

## 2018-02-13 LAB — GLUCOSE, CAPILLARY
GLUCOSE-CAPILLARY: 105 mg/dL — AB (ref 70–99)
Glucose-Capillary: 136 mg/dL — ABNORMAL HIGH (ref 70–99)
Glucose-Capillary: 125 mg/dL — ABNORMAL HIGH (ref 70–99)
Glucose-Capillary: 85 mg/dL (ref 70–99)

## 2018-02-13 LAB — BASIC METABOLIC PANEL
Anion gap: 6 (ref 5–15)
BUN: 50 mg/dL — ABNORMAL HIGH (ref 6–20)
CO2: 25 mmol/L (ref 22–32)
Calcium: 8.3 mg/dL — ABNORMAL LOW (ref 8.9–10.3)
Chloride: 107 mmol/L (ref 98–111)
Creatinine, Ser: 2.62 mg/dL — ABNORMAL HIGH (ref 0.61–1.24)
GFR calc Af Amer: 34 mL/min — ABNORMAL LOW (ref >60)
GFR calc non Af Amer: 29 mL/min — ABNORMAL LOW (ref >60)
Glucose, Bld: 151 mg/dL — ABNORMAL HIGH (ref 70–99)
Potassium: 4.4 mmol/L (ref 3.5–5.1)
Sodium: 138 mmol/L (ref 135–145)

## 2018-02-13 MED ORDER — INSULIN DEGLUDEC 200 UNIT/ML ~~LOC~~ SOPN: 70.0000 [IU] | PEN_INJECTOR | Freq: Every day | SUBCUTANEOUS | 3 refills | Status: DC

## 2018-02-13 MED ORDER — CEPHALEXIN 250 MG PO CAPS: 250.0000 mg | ORAL_CAPSULE | Freq: Three times a day (TID) | ORAL | 0 refills | Status: DC

## 2018-02-13 MED ORDER — TRAMADOL HCL 50 MG PO TABS: 50.0000 mg | ORAL_TABLET | Freq: Four times a day (QID) | ORAL | 0 refills | Status: AC | PRN

## 2018-02-13 MED ORDER — INSULIN ASPART 100 UNIT/ML FLEXPEN: 3.0000 [IU] | PEN_INJECTOR | Freq: Three times a day (TID) | SUBCUTANEOUS | 5 refills | Status: DC

## 2018-02-13 MED ORDER — LACTATED RINGERS IV SOLN: INTRAVENOUS | Status: AC

## 2018-02-13 MED ORDER — MAGNESIUM CITRATE PO SOLN
1.0000 | Freq: Once | ORAL | Status: DC
  Filled 2018-02-13: qty 296

## 2018-02-13 NOTE — Discharge Summary (Signed)
Name: [name] MRN: [id number] DOB: Mar 07, 1975 43 y.o. PCP: [name], DO  Date of Admission: 02/08/2018  6:34 PM Date of Discharge: 02/14/2018 Attending Physician: Murriel Hopper, MD  Discharge Diagnosis: 1. Epididymitis 2. Complicated UTI 3. Type 2 Diabetes 4. Chronic heart failure with preserved ejection fraction  Discharge Medications: Allergies as of 02/14/2018      Reactions   Reglan [metoclopramide] Other (See Comments)   Dysphoric reaction      Medication List    STOP taking these medications   cyclobenzaprine 5 MG tablet Commonly known as:  FLEXERIL   furosemide 40 MG tablet Commonly known as:  LASIX   metolazone 2.5 MG tablet Commonly known as:  ZAROXOLYN     TAKE these medications   accu-chek softclix lancets Use to check blood sugars 5 times a day. Dx code:E11.9. Insulin dependent.   ACCU-CHEK SOFTCLIX LANCETS lancets USE TO CHECK BLOOD SUGAR 5 TIMES DAILY. DX CODE E11.9.   allopurinol 100 MG tablet Commonly known as:  ZYLOPRIM TAKE 2 TABLETS BY MOUTH ONCE DAILY   Alpha-Lipoic Acid 200 MG Caps Take 1 capsule (200 mg total) by mouth daily.   amLODipine 5 MG tablet Commonly known as:  NORVASC TAKE 1 TABLET BY MOUTH ONCE DAILY   apixaban 5 MG Tabs tablet Commonly known as:  ELIQUIS Take 1 tablet (5 mg total) by mouth 2 (two) times daily.   B-12 2500 MCG Subl Place 2,500 mcg under the tongue daily.   calcitRIOL 0.25 MCG capsule Commonly known as:  ROCALTROL TAKE 1 CAPSULE BY MOUTH ONCE DAILY   carvedilol 25 MG tablet Commonly known as:  COREG TAKE 1 TABLET BY MOUTH TWICE DAILY What changed:  when to take this   cephALEXin 250 MG capsule Commonly known as:  KEFLEX Take 1 capsule (250 mg total) by mouth 3 (three) times daily for 4 days.   cloNIDine 0.3 MG tablet Commonly known as:  CATAPRES Take 1 tablet (0.3 mg total) by mouth 2 (two) times daily.   FISH OIL PO Take 1 capsule by mouth at bedtime.   gabapentin  100 MG capsule Commonly known as:  NEURONTIN Take 100 mg by mouth 3 (three) times daily.   glucose blood test strip Commonly known as:  ACCU-CHEK AVIVA PLUS 1 each by Other route 3 (three) times daily. Use as instructed to check blood sugar 3 times daily. DX:E11.65   hydrALAZINE 50 MG tablet Commonly known as:  APRESOLINE Take 50 mg by mouth 4 (four) times daily.   insulin aspart 100 UNIT/ML FlexPen Commonly known as:  NOVOLOG FLEXPEN Inject 3 Units into the skin 3 (three) times daily with meals. What changed:    how much to take  when to take this  Another medication with the same name was removed. Continue taking this medication, and follow the directions you see here.   Insulin Degludec 200 UNIT/ML Sopn Inject 60 Units into the skin daily. What changed:  how much to take   loratadine 10 MG tablet Commonly known as:  CLARITIN Take 10 mg by mouth daily as needed (seasonal allergies).   lovastatin 20 MG tablet Commonly known as:  MEVACOR TAKE 1 TABLET BY MOUTH ONCE DAILY What changed:  when to take this   oxybutynin 5 MG tablet Commonly known as:  DITROPAN Take 5 mg by mouth 3 (three) times daily.   Pen Needles 3/16" 31G X 5 MM Misc Use five times daily   Insulin Pen Needle 31G X 5  MM Misc Commonly known as:  B-D UF III MINI PEN NEEDLES USE  4 TIMES DAILY   polyethylene glycol packet Commonly known as:  MIRALAX / GLYCOLAX Take 17 g by mouth daily as needed for mild constipation.   promethazine 12.5 MG tablet Commonly known as:  PHENERGAN Take 1 tablet (12.5 mg total) by mouth every 8 (eight) hours as needed for nausea or vomiting.   senna-docusate 8.6-50 MG tablet Commonly known as:  Senokot-S Take 2 tablets by mouth daily as needed for mild constipation.   traMADol 50 MG tablet Commonly known as:  ULTRAM Take 1 tablet (50 mg total) by mouth every 6 (six) hours as needed for up to 5 days for moderate pain.   VICTOZA 18 MG/3ML Sopn Generic drug:   liraglutide INJECT 1.2MG  (0.2 MLS) SUBCUTANEOUSLY ONCE DAILY AT THE SAME TIME What changed:  See the new instructions.   zinc sulfate 220 (50 Zn) MG capsule Take 220 mg by mouth daily.       Disposition and follow-up:   Mr.Raine Kirsch was discharged from [facility] in Stable condition.  At the hospital follow up visit please address:  A. Epididymitis - Please ensure his scrotal pain is well controlled and he has follow up with urology - Please ensure he finished his course of abx (Keflex til 02/18/18)  B. Complicated UTI - Please ensure he finished his course of abx (Keflex til 02/18/18)  C. Type 2 Diabetes - His Tresiba dose was reduced to 60 units and His novolog dose was reduced to 3 units TID qc due to hypoglycemic events in hospital - Please adjust his dose as needed at f/u appointment - Consider repeating gastric emptying study if nausea and/or vomiting reoccur  D. Chronic heart failure with preserved ejection fraction - His diuretics were stopped temporarily in setting of significant volume loss w/ vomiting and good urinary output - Please assess his volume status and restart diuretic as needed  2.  Labs / imaging needed at time of follow-up: BMP  3.  Pending labs/ test needing follow-up: None  Follow-up Appointments: Follow-up Information    Festus Aloe, MD Follow up.   Specialty:  Urology Why:  Scheduled follow-up in February Contact information: [place] [address] [phone]        [place]. Go on 02/20/2018.   Why:  1:45 PM Contact information: [address] [place] Hospital Course by problem list: A. Epididymitis: [name] is a 44 yo M w/ PMH of DVT, CAD, IDDM, HTN, CKD3, OSA who presented with scrotal pain, intractable nausea and vomiting. Ultrasound of scrtum showed 3.1x2.6.1.5cm complex fluid collection vs mass. Urology was  consulted who recommended abx therapy for presumed epididymitis. His pain, nausea and vomiting resolved over the course of hospitalization with tramadol and Ancef. Discharged with recommendation to follow up with urology outpatient and finish 10 day course of antibiotics with Keflex (end date 02/18/18)  B. Complicated UTI: Presented after recent hospitalization for Kleb pneumo UTI. Found to not have finished his abx course after discharge. Restarted on Keflex for presumed recurrence. Urine culture showed no growth. Discharged with instructions to finish prolonged course of abx as described above.  C. Type 2 Diabetes: Had multiple episodes of hypoglycemia due to poor oral intake with nausea and vomiting during hospitalization. Prior hx of gastric emptying study showing mild gastroparesis. Able to tolerate some  food after abx and zofran therapy but continued to have morning hypoglycemic episodes despite on half dose of home long-acting insulin. Discharged with instructions to further decrease long-acting insulin to 60 units (home dose 140 units)  D. Chronic heart failure with preserved ejection fraction: Presented with volume loss via nausea and vomiting and poor oral intake. His home furosemide and metolazone was held. Mild AKI improved with fluid resuscitation. Had good urinary output during hospitalization. Given instructions to have close follow up with PCP to-reassess fluid status and restart diuretics if noticing significant weight gain or respiratory distress.   Discharge Vitals:   BP (!) 142/72 (BP Location: Left Arm)   Pulse 72   Temp 98.4 F (36.9 C) (Oral)   Resp 16   Ht 6\' 2"  (1.88 m)   Wt (!) 180 kg   SpO2 96%   BMI 50.95 kg/m   Pertinent Labs, Studies, and Procedures:  BMP Latest Ref Rng & Units 02/13/2018 02/12/2018 02/11/2018  Glucose 70 - 99 mg/dL 151(H) 58(L) 142(H)  BUN 6 - 20 mg/dL 50(H) 55(H) 63(H)  Creatinine 0.61 - 1.24 mg/dL 2.62(H) 2.45(H) 2.81(H)  BUN/Creat Ratio 9 - 20 - -  -  Sodium 135 - 145 mmol/L 138 139 138  Potassium 3.5 - 5.1 mmol/L 4.4 4.2 4.2  Chloride 98 - 111 mmol/L 107 108 104  CO2 22 - 32 mmol/L 25 23 25   Calcium 8.9 - 10.3 mg/dL 8.3(L) 8.4(L) 8.3(L)   CT ABD/PELVIS IMPRESSION: 1. No renal or ureteral stone or obstruction. 2. Cholelithiasis without evidence of cholecystitis. 3. Hazy opacities in the lung bases could be due to motion artifact or edema. 4. Small pericardial effusion.  US Scrotum w/ Doppler IMPRESSION: 1. Complex left extratesticular/epididymal fluid collection or mass. Appearance could reflect hematoma, abscess, or heterogeneous mass lesion. 2. Both testicles appear intact and symmetrical. No evidence of testicular torsion.  Discharge Instructions: Mr.Epling  You came to [place] with complaints of vomiting and nausea and groin pain. You were sound to have inflammation of your scrotum and we have provided medication for pain and nausea. Here are our recommendations for you at discharge:  - Please finish your keflex 250mg  3 times a day for 4 more days - Please take your tramadol 50mg  daily for 6 hours only as needed for severe pain - Please decrease your novolog dose to 3 units with meals and your Insulin Degludec dose to 60 units daily - Please stop your lasix and metolazone until you had a chance to see you primary care provider - If you notice any significant swelling or greater than 5 pound in a day, please restart your lasix and metolazone  Thank you for choosing [facility]   Discharge Instructions    Call MD for:  difficulty breathing, headache or visual disturbances   Complete by:  As directed    Call MD for:  persistant nausea and vomiting   Complete by:  As directed    Call MD for:  redness, tenderness, or signs of infection (pain, swelling, redness, odor or green/yellow discharge around incision site)   Complete by:  As directed    Call MD for:  severe uncontrolled pain   Complete by:  As directed    Diet -  low sodium heart healthy   Complete by:  As directed    Increase activity slowly   Complete by:  As directed       Signed: Mosetta Anis, MD 02/16/2018, 6:18 AM   Pager: [phone]

## 2018-02-13 NOTE — Progress Notes (Signed)
Subjective:  [name] is a 43 y.o. with PMH of DVT, CAD, IDDM, HTN, CKD3, OSA, and hx of R AKA admit for nausea and vomiting on hospital day 4  [name] was examined and evaluated at bedside this AM. He was observed resting comfortably at bedside chair. He states he was able to tolerate dinner last night and he has unopened breakfast tray at bedside which he will attempt to eat this morning. He states he is continuing to have loss of appetite bu no further nausea or vomiting. He states his abdominal pain has disappeared but he is continuing to have significant constipation with no bowel movement since admission.  Objective:  Vital signs in last 24 hours: Vitals:   02/12/18 1725 02/13/18 0008 02/13/18 0652 02/13/18 0840  BP: (!) 160/83 (!) 159/72 (!) 149/76 (!) 156/91  Pulse:  71 72 74  Resp:  15 15 17   Temp:  98.9 F (37.2 C) 98.5 F (36.9 C) 97.8 F (36.6 C)  TempSrc:  Oral Oral Oral  SpO2:  99% 97% 98%  Weight:      Height:       Gen: Morbidly obese, NAD Neck: supple, ROM intact, no JVD, no cervical adenopathy CV: RRR, S1, S2 normal, No rubs, no murmurs, no gallops Pulm: CTAB, no rales, no wheezes Abd: Soft, BS+, Non-tender to palpation, No rebound, No guarding Extm: S/p R AKA, Peripheral pulses intact, Trace pitting edema around left ankle Skin: Dry, Warm, normal turgor, no rashes, lesions, wounds.  Neuro: AAOx3,  Psych: Normal mood and affect  Assessment/Plan:  Active Problems:   Insulin dependent type 2 diabetes mellitus, controlled (HCC)   Chronic renal insufficiency, stage 3 (moderate) (HCC)   Unilateral AKA, right (HCC)   Normocytic anemia   Blood in stool   Complicated UTI (urinary tract infection)   AKI (acute kidney injury) (HCC)   Intractable nausea and vomiting   Dehydration   Intractable vomiting   Epididymitis  [name] is 43 yo M w/ PMH of DVT, CAD, IDDM, HTN, CKD3, OSA, and hx of R AKA admit for intractable nausea and vomiting. Still  endorsing poor appetite but nausea, vomiting and abdominal pain appear to have resolved. Will discharge today as long as he is able to have bowel movement.  Nausea and vomiting due to recurrent UTI vs gastroparesis vs viral gastroenteritis UA culture negative on final read. Nausea and vomiting continuing to improve - Zofran 4mg  IV q8hr PRN for nausea/vomiting - C/w cefazolin 1g q8hr day 5/7 (end-date 02/15/18)  L sided Scrotal Pain, Epididymitis, Possible Hematoma US show 3.1x2.6x1.5cm complex fluid collection or mass possibly hematoma vs abscess vs fluid collection. Testicular torsion ruled out. Follows with [facility]. Scrotal pain is improving with supportive therapy - Appreciate Urology Recs: Suspect epididymitis continue abx and supportive therapy - Tramadol 50mg  q6hr PRN for pain - Continue Cefazolin as above  AKI on CKD 3 2/2 Dehydration Current creatinine 2.78->2.81->2.45->2.62. Likely due to continued poor oral intake Baseline 2.2.  - Trend renal function - C/w calcitriol for secondary hypocalcemia - Avoid nephrotoxic meds - Encourage PO intake - LR 100cc/hr for 10 hrs  Chronic normocytic anemia 2/2 renal disease vs GI bleed Hgb stable at 9.1. Ferritin 850 Iron 43, TIBC 196, Iron Sat 22% - F/u FOBT (awaiting bowel movement) - Mag citrate 1 time dose - Scheduled senokot, miralax for opioid related constipation - Trend CBC   IDDM: Last A1c 8.7 (09/2017). Home meds: Tresiba 140 units daily, Victoza 1.2mg  daily, Novolog  25 Unit TID. No hypoglycemic episodes overnight. Fasting bg 151 this am - Glucose checks - Lantus 70 units daily - Novolog 3 units qc - SSI  Hx of HFpEF Home meds: furosemide 80mg  BID, metolazone 2.5mg  every other day. - Holding home diuretics in setting of AKI - Strict I&Os - Daily Weights  DVT prophx: Eliquis Diet: Diabetic Bowel: Senokot, Miralax, Mag Citrate Code: Full  Dispo: Anticipated discharge in approximately 0-1 day(s).   Mosetta Anis, MD 02/13/2018, 11:34 AM Pager: [phone]

## 2018-02-13 NOTE — Progress Notes (Signed)
Medicine attending: I examined this patient today together with resident physician Dr. Gilberto Better and I concur with his evaluation and management plan. He continues to improve.  Tolerating oral diet.  Minimal to no abdominal pain.  Persistent constipation. We will proceed with a gastric emptying study today.  Trial of Reglan if study is positive. Blood sugars stable but still with some low values.  This should improve back on regular diet.  Currently receiving Lantus insulin 70 units daily.  Short acting insulin as needed to cover meals not needed recently in view of decreased oral intake. Fluctuating renal function but within his baseline values.  CKD 3B. Anticipate discharge later today pending completion of gastric emptying study.

## 2018-02-13 NOTE — Care Management Note (Signed)
Case Management Note  Patient Details  Name: [name] MRN: [id number] Date of Birth: 12-Aug-1975  Subjective/Objective:    Pt in with intractable nausea and vomitting. He is from home with girlfriend.  Has all needed DME.                Action/Plan: Pt discharging home with self care. Pt states he has transportation home.   Expected Discharge Date:  02/13/18               Expected Discharge Plan:  Home/Self Care  In-House Referral:     Discharge planning Services     Post Acute Care Choice:    Choice offered to:     DME Arranged:    DME Agency:     HH Arranged:    HH Agency:     Status of Service:  Completed, signed off  If discussed at [employer] of Stay Meetings, dates discussed:    Additional Comments:  Pollie Friar, RN 02/13/2018, 4:52 PM

## 2018-02-13 NOTE — Telephone Encounter (Signed)
Hospital TOC per Dr Truman Hayward, discharge 02/13/18, appt 2/12.

## 2018-02-14 DIAGNOSIS — N50819 Testicular pain, unspecified: Secondary | ICD-10-CM

## 2018-02-14 LAB — GLUCOSE, CAPILLARY
GLUCOSE-CAPILLARY: 35 mg/dL — AB (ref 70–99)
GLUCOSE-CAPILLARY: 115 mg/dL — AB (ref 70–99)
Glucose-Capillary: 45 mg/dL — ABNORMAL LOW (ref 70–99)
Glucose-Capillary: 151 mg/dL — ABNORMAL HIGH (ref 70–99)
Glucose-Capillary: 183 mg/dL — ABNORMAL HIGH (ref 70–99)
Glucose-Capillary: 162 mg/dL — ABNORMAL HIGH (ref 70–99)

## 2018-02-14 MED ORDER — GLUCOSE 40 % PO GEL: 2.0000 | ORAL | Status: DC

## 2018-02-14 MED ORDER — INSULIN ASPART 100 UNIT/ML FLEXPEN: 3.0000 [IU] | PEN_INJECTOR | Freq: Three times a day (TID) | SUBCUTANEOUS | 5 refills | Status: DC

## 2018-02-14 MED ORDER — DEXTROSE 50 % IV SOLN
INTRAVENOUS | Status: AC
  Administered 2018-02-14: 25 mL
  Filled 2018-02-14: qty 50

## 2018-02-14 MED ORDER — INSULIN DEGLUDEC 200 UNIT/ML ~~LOC~~ SOPN: 60.0000 [IU] | PEN_INJECTOR | Freq: Every day | SUBCUTANEOUS | 3 refills | Status: DC

## 2018-02-14 MED ORDER — CEPHALEXIN 250 MG PO CAPS: 250.0000 mg | ORAL_CAPSULE | Freq: Three times a day (TID) | ORAL | 0 refills | Status: AC

## 2018-02-14 MED ORDER — CEPHALEXIN 250 MG PO CAPS: 250.0000 mg | ORAL_CAPSULE | Freq: Three times a day (TID) | ORAL | 0 refills | Status: DC

## 2018-02-14 NOTE — Discharge Instructions (Addendum)
[name]  You came to [place] with complaints of vomiting and nausea and groin pain. You were sound to have inflammation of your scrotum and we have provided medication for pain and nausea. Here are our recommendations for you at discharge:  - Please finish your keflex 250mg  3 times a day for 4 more days - Please take your tramadol 50mg  daily for 6 hours only as needed for severe pain - Please decrease your novolog dose to 3 units with meals and your Insulin Degludec dose to 60 units daily - Please stop your lasix and metolazone until you had a chance to see you primary care provider - If you notice any significant swelling or greater than 5 pound in a day, please restart your lasix and metolazone  Thank you for choosing [facility]   Information on my medicine - ELIQUIS (apixaban)  Why was Eliquis prescribed for you? Eliquis was prescribed for you to reduce the risk of forming blood clots that can cause a stroke if you have a medical condition called atrial fibrillation (a type of irregular heartbeat) OR to reduce the risk of a blood clots forming after orthopedic surgery.  What do You need to know about Eliquis ? Take your Eliquis TWICE DAILY - one tablet in the morning and one tablet in the evening with or without food.  It would be best to take the doses about the same time each day.  If you have difficulty swallowing the tablet whole please discuss with your pharmacist how to take the medication safely.  Take Eliquis exactly as prescribed by your doctor and DO NOT stop taking Eliquis without talking to the doctor who prescribed the medication.  Stopping may increase your risk of developing a new clot or stroke.  Refill your prescription before you run out.  After discharge, you should have regular check-up appointments with your healthcare provider that is prescribing your Eliquis.  In the future your dose may need to be changed if your kidney function or weight changes by a  significant amount or as you get older.  What do you do if you miss a dose? If you miss a dose, take it as soon as you remember on the same day and resume taking twice daily.  Do not take more than one dose of ELIQUIS at the same time.  Important Safety Information A possible side effect of Eliquis is bleeding. You should call your healthcare provider right away if you experience any of the following: ? Bleeding from an injury or your nose that does not stop. ? Unusual colored urine (red or dark brown) or unusual colored stools (red or black). ? Unusual bruising for unknown reasons. ? A serious fall or if you hit your head (even if there is no bleeding).  Some medicines may interact with Eliquis and might increase your risk of bleeding or clotting while on Eliquis. To help avoid this, consult your healthcare provider or pharmacist prior to using any new prescription or non-prescription medications, including herbals, vitamins, non-steroidal anti-inflammatory drugs (NSAIDs) and supplements.  This website has more information on Eliquis (apixaban): [email or web address].[email or web address].     Epididymitis  Epididymitis is swelling (inflammation) of the epididymis. The epididymis is a cord-like structure that is located along the top and back part of the testicle. It collects and stores sperm from the testicle. This condition can also cause pain and swelling of the testicle and scrotum. Symptoms usually start suddenly (acute epididymitis). Sometimes epididymitis starts gradually and lasts for a  while (chronic epididymitis). This type may be harder to treat. What are the causes? In men 68 and younger, this condition is usually caused by a bacterial infection or sexually transmitted disease (STD), such as:  Gonorrhea.  Chlamydia. In men 33 and older who do not have anal sex, this condition is usually caused by bacteria from a blockage or abnormalities in the urinary system. These can result from:  Having a  tube placed into the bladder (urinary catheter).  Having an enlarged or inflamed prostate gland.  Having recent urinary tract surgery. In men who have a condition that weakens the bodys defense system (immune system), such as HIV, this condition can be caused by:  Other bacteria, including tuberculosis and syphilis.  Viruses.  Fungi. Sometimes this condition occurs without infection. That may happen if urine flows backward into the epididymis after heavy lifting or straining. What increases the risk? This condition is more likely to develop in men:  Who have unprotected sex with more than one partner.  Who have anal sex.  Who have recently had surgery.  Who have a urinary catheter.  Who have urinary problems.  Who have a suppressed immune system. What are the signs or symptoms? This condition usually begins suddenly with chills, fever, and pain behind the scrotum and in the testicle. Other symptoms include:  Swelling of the scrotum, testicle, or both.  Pain whenejaculatingor urinating.  Pain in the back or belly.  Nausea.  Itching and discharge from the penis.  Frequent need to pass urine.  Redness and tenderness of the scrotum. How is this diagnosed? Your health care provider can diagnose this condition based on your symptoms and medical history. Your health care provider will also do a physical exam to ask about your symptoms and check your scrotum and testicle for swelling, pain, and redness. You may also have other tests, including:  Examination of discharge from the penis.  Urine tests for infections, such as STDs. Your health care provider may test you for other STDs, including HIV. How is this treated? Treatment for this condition depends on the cause. If your condition is caused by a bacterial infection, oral antibiotic medicine may be prescribed. If the bacterial infection has spread to your blood, you may need to receive IV antibiotics. Nonbacterial  epididymitis is treated with home care that includes bed rest and elevation of the scrotum. Surgery may be needed to treat:  Bacterial epididymitis that causes pus to build up in the scrotum (abscess).  Chronic epididymitis that has not responded to other treatments. Follow these instructions at home: Medicines  Take over-the-counter and prescription medicines only as told by your health care provider.  If you were prescribed an antibiotic medicine, take it as told by your health care provider. Do not stop taking the antibiotic even if your condition improves. Sexual Activity  If your epididymitis was caused by an STD, avoid sexual activity until your treatment is complete.  Inform your sexual partner or partners if you test positive for an STD. They may need to be treated.Do not engage in sexual activity with your partner or partners until their treatment is completed. General instructions  Return to your normal activities as told by your health care provider. Ask your health care provider what activities are safe for you.  Keep your scrotum elevated and supported while resting. Ask your health care provider if you should wear a scrotal support, such as a jockstrap. Wear it as told by your health care provider.  If directed, apply ice to the affected area: ? Put ice in a plastic bag. ? Place a towel between your skin and the bag. ? Leave the ice on for 20 minutes, 2-3 times per day.  Try taking a sitz bath to help with discomfort. This is a warm water bath that is taken while you are sitting down. The water should only come up to your hips and should cover your buttocks. Do this 3-4 times per day or as told by your health care provider.  Keep all follow-up visits as told by your health care provider. This is important. Contact a health care provider if:  You have a fever.  Your pain medicine is not helping.  Your pain is getting worse.  Your symptoms do not improve within  three days. This information is not intended to replace advice given to you by your health care provider. Make sure you discuss any questions you have with your health care provider. Document Released: 12/24/1999 Document Revised: 06/03/2015 Document Reviewed: 05/13/2014 Elsevier Interactive Patient Education  2019 [employer].

## 2018-02-14 NOTE — Progress Notes (Signed)
Pt discharge education and instructions completed with pt and spouse at bedside; both voices understanding and denies any questions. Pt IV removed; pt to pick up electronically sent prescriptions from preferred pharmacy on file. Pt discharge home with spouse to transport him home. Pt transported off unit via his personal wheelchair by spouse with belongings to the side. Delia Heady RN

## 2018-02-14 NOTE — Progress Notes (Signed)
Patient aware that MD ordered magnesium citrate. Aware that this is a liquid to drink. Offered IV zofran to combat nausea but upon flush, IV leaking and had to be removed. IV team attempted to replace without success. Patient aware that when IV placed, will receive IV zofran and then agreeable to drink magnesium citrate. Will report status to oncoming nurse. Wendee Copp

## 2018-02-14 NOTE — Progress Notes (Signed)
Subjective:  [name] is a 43 y.o. with PMH of DVT, CAD, IDDM, HTN, CKD3, OSA, and hx of R AKA admit for nausea and vomiting on hospital day 5  [name] was examined and evaluated at bedside this AM. He states he was under the impression that he should not take the mag citrate because his IV came out. He states he is currently endorsing some mild abdominal comfort and still has not had a bowel movement. He denies any nausea, vomiting. He was able to tolerate all of his lunch and dinner yesterday without issues.   Objective:  Vital signs in last 24 hours: Vitals:   02/13/18 1931 02/13/18 2348 02/14/18 0331 02/14/18 0805  BP: (!) 154/74 (!) 150/71 (!) 168/73 (!) 132/52  Pulse: 71 72 75 68  Resp: 18 18 18 20   Temp: 98.1 F (36.7 C) 98.7 F (37.1 C) 98.9 F (37.2 C) 98.2 F (36.8 C)  TempSrc: Oral Oral Oral Oral  SpO2: 96%  100% 95%  Weight:   (!) 180 kg   Height:       Gen: Morbidly obese, NAD Neck: supple, ROM intact, no JVD, no cervical adenopathy CV: RRR, S1, S2 normal, No rubs, no murmurs, no gallops Pulm: CTAB, no rales, no wheezes Abd: Soft, BS+, Mild tenderness to deep palpation on RLQ, No rebound, No guarding Extm: S/p R AKA, Peripheral pulses intact, Trace pitting edema around left ankle Skin: Dry, Warm, normal turgor, no rashes, lesions, wounds.  Neuro: AAOx3,  Psych: Normal mood and affect  Assessment/Plan:  Active Problems:   Insulin dependent type 2 diabetes mellitus, controlled (HCC)   Chronic renal insufficiency, stage 3 (moderate) (HCC)   Unilateral AKA, right (HCC)   Normocytic anemia   Blood in stool   Complicated UTI (urinary tract infection)   AKI (acute kidney injury) (HCC)   Intractable nausea and vomiting   Dehydration   Intractable vomiting   Epididymitis  [name] is 43 yo M w/ PMH of DVT, CAD, IDDM, HTN, CKD3, OSA, and hx of R AKA admit for intractable nausea and vomiting. Currently endorsing back to baseline appetite and tolerating  meals well. He had hypoglycemic episode this morning. Have some abdominal discomfort which is most likely due to his constipation. Will discharge today  Nausea and vomiting due to recurrent UTI vs gastroparesis vs viral gastroenteritis Resolved  L sided Scrotal Pain, Epididymitis, Possible Hematoma US show 3.1x2.6x1.5cm complex fluid collection or mass possibly hematoma vs abscess vs fluid collection. Follows with [facility]. Scrotal pain is improving with supportive therapy - Appreciate Urology Recs: Suspect epididymitis continue abx and supportive therapy - Tramadol 50mg  q6hr PRN for pain - Continue Cefazolin on day 6/7 (end date 02/15/18) will d/c with keflex  Chronic normocytic anemia 2/2 renal disease vs GI bleed Hgb stable at 9.1. Ferritin 850 Iron 43, TIBC 196, Iron Sat 22% - F/u FOBT (awaiting bowel movement) - Mag citrate 1 time dose - Scheduled senokot, miralax for opioid related constipation   IDDM: Last A1c 8.7 (09/2017). Home meds: Tresiba 140 units daily, Victoza 1.2mg  daily, Novolog 25 Unit TID. Had hypo - Glucose checks - Decrease Lantus to 60 units daily on discharge - Novolog 3 units qc - SSI  Hx of HFpEF Home meds: furosemide 80mg  BID, metolazone 2.5mg  every other day. - Holding home diuretics in setting of AKI - Strict I&Os - Daily Weights  DVT prophx: Eliquis Diet: Diabetic Bowel: Senokot, Miralax, Mag Citrate Code: Full  Dispo: Anticipated discharge in approximately  today(s).   Mosetta Anis, MD 02/14/2018, 10:27 AM Pager: [phone]

## 2018-02-14 NOTE — Progress Notes (Signed)
Medicine attending discharge note: I personally examined this patient on the day of discharge and I attest to the accuracy of the evaluation and discharge plan as recorded by resident physician Dr. Gilberto Better and his final progress note and which will be further detailed in his discharge summary.  Stoic 43 year old type I diabetic admitted on February 1 with nausea, vomiting, and abdominal pain.  Recently treated for a Klebsiella urinary tract infection.  Antibiotics were resumed.  Survey culture on January 31 was negative.  While hospitalized he developed left scrotal pain and was felt to have epididymitis.  Symptoms slowly improved on antibiotics.  Anticipate urology referral after discharge. His GI symptoms slowly resolved and he was eating a regular diet by time of discharge. He had significant constipation.  He did not respond to routine laxatives.  Although on initial review of systems at admission he complained of seeing a dark stool, we could never confirm this since he did not move his bowels. He had a number of hypoglycemic episodes secondary to decreased oral intake and ongoing insulin use.  Insulin dose was reduced by over 50%.  Blood sugars in stable range for 8 hours prior to discharge with most recent value 183.  Disposition: Condition stable at time of discharge Follow-up in our general medicine clinic Complications: Hypoglycemic episodes secondary to poor oral intake and ongoing insulin use.

## 2018-02-14 NOTE — Progress Notes (Signed)
At 0323 am Patient c/o feeling low BS, checked  BS it was 35mg /dl, used Hypoglycemic protocol to treat, rechecked in 15-20mins, it was 45mg /dl. Then administered D50 43ml and rechecked in 15-20 mins , it was 151mg /dl.  Patient is resting in a bed at this time, no s/s of distress, will continue to monitor closely.

## 2018-02-15 NOTE — Telephone Encounter (Signed)
TOC call placed to patient to f/u on recent hospital discharge.  Pt states "You have called me at a bad time and I do not have time to talk with you right now".  Pt request a call back after 1pm today if possible.  RN informed pt someone would try to call back after 1pm today if clinic schedule would allow.  Pt verbalized understanding.  SChaplin, RN,BSN

## 2018-02-15 NOTE — Telephone Encounter (Signed)
rtc to pt, no answer

## 2018-02-20 ENCOUNTER — Ambulatory Visit (INDEPENDENT_AMBULATORY_CARE_PROVIDER_SITE_OTHER): Payer: Medicare Other | Admitting: Internal Medicine

## 2018-02-20 ENCOUNTER — Other Ambulatory Visit: Payer: Self-pay

## 2018-02-20 ENCOUNTER — Ambulatory Visit: Payer: Medicare Other | Admitting: Pharmacist

## 2018-02-20 ENCOUNTER — Encounter: Payer: Self-pay | Admitting: Internal Medicine

## 2018-02-20 VITALS — BP 186/101 | HR 96 | Temp 98.0°F | Ht 74.0 in

## 2018-02-20 DIAGNOSIS — N451 Epididymitis: Secondary | ICD-10-CM | POA: Diagnosis not present

## 2018-02-20 DIAGNOSIS — E119 Type 2 diabetes mellitus without complications: Secondary | ICD-10-CM

## 2018-02-20 DIAGNOSIS — N390 Urinary tract infection, site not specified: Secondary | ICD-10-CM | POA: Diagnosis not present

## 2018-02-20 DIAGNOSIS — E1165 Type 2 diabetes mellitus with hyperglycemia: Secondary | ICD-10-CM

## 2018-02-20 DIAGNOSIS — Z794 Long term (current) use of insulin: Principal | ICD-10-CM

## 2018-02-20 DIAGNOSIS — R112 Nausea with vomiting, unspecified: Secondary | ICD-10-CM

## 2018-02-20 DIAGNOSIS — Z6841 Body Mass Index (BMI) 40.0 and over, adult: Secondary | ICD-10-CM

## 2018-02-20 DIAGNOSIS — I10 Essential (primary) hypertension: Secondary | ICD-10-CM | POA: Diagnosis not present

## 2018-02-20 LAB — POCT GLYCOSYLATED HEMOGLOBIN (HGB A1C): Hemoglobin A1C: 5.6 % (ref 4.0–5.6)

## 2018-02-20 LAB — GLUCOSE, CAPILLARY
GLUCOSE-CAPILLARY: 55 mg/dL — AB (ref 70–99)
Glucose-Capillary: 83 mg/dL (ref 70–99)

## 2018-02-20 MED ORDER — GLUCAGON 3 MG/DOSE NA POWD: 1.0000 | Freq: Once | NASAL | 0 refills | Status: AC

## 2018-02-20 NOTE — Progress Notes (Addendum)
Patient was seen today in a co-visit, medications were reviewed with the patient, including name, instructions, indication, goals of therapy, potential side effects, importance of adherence, and safe use. See Dr. Olene Craven note for details.  Patient reports no concerns with medications acutely. He does state he has been experiencing hypoglycemia after exercise and treats with powerade or other drinks. The most recent episode was BG of 37, was able to self-treat with powerade. Patient is interested in obtaining glucagon in case unable to self-treat. Will price check different options. He does report follow up scheduled with endocrinology for insulin management.  Patient verbalized understanding by repeating back information and was advised to contact me if medication-related questions arise. He requests medication review appointment with me quarterly.

## 2018-02-20 NOTE — Assessment & Plan Note (Addendum)
Initiallythought to be related to gastroparesis given his history of diabetes. However, it is possible this was secondary to active infection as his symptoms have resolved. He reports good PO intake with the exception of today where he did not have time to eat breakfast. May consider gastric emptying study in the future if symptoms recur.

## 2018-02-20 NOTE — Assessment & Plan Note (Addendum)
Patient follows up with endocrinology (Dr. Dwyane Dee) for his insulin-dependent diabetes. He was noted to have several episodes of hypoglycemia during recent admission and his Tresiba dose was decreased to 130--> 60U daily and Novolog to 25->3U TID with meals. Since discharged he has been taking [place] as indicated, but he continues to use Novolog 25U TID with meals. He had not had any issues with hypoglycemia since discharge until this AM when he injected both his long and short acting insulin but had to leave home to go to the gym without eating breakfast. His BG dropped to 37 at the gym. He was symptomatic. This improved > 100 after a snack. He reports feeling well and as his usual self during today's visit. His CBG was 55 and 83 15 minutes after a snack. I do not think his BG was this low as he was asymptomatic (though on a beta blocker) and mentating at his baseline. I have advised to hold his supper dose of Novolog and call Dr. Dwyane Dee in AM as he is the one managing patient's insulin. A1c today 5.6, will fax result to Dr. Dwyane Dee per patient requests.

## 2018-02-20 NOTE — Assessment & Plan Note (Signed)
[name] was admitted to the hospital twice within 1 week due to urinary symptoms from a complicated UTI cause by Klebsiella pneumoniae. He was treated with Rocephin and transitioned to Kefex per sensitivities. He did not complete recommended antibiotic course and was readmitted on 1/31 with similar symptoms. No growth on urine culture. He was started on Keflex again and has completed a 10-day course. He denies urinary symptoms today. No further intervention.

## 2018-02-20 NOTE — Patient Instructions (Signed)
Mr. Voorhis,   I am glad you are feeling better.   For your diabetes continue taking your insulin as you are right now.  Please call us if you have any issues with your blood sugars.  We will restart your home Lasix and metolazone today as her blood pressure is very high.  Please make a follow-up appointment with [place] in 2 weeks to recheck your blood pressure.  We will check on your kidneys today as well as an A1c.  We will let you know the results of your blood test.  Call us if you have any questions or concerns.  -Dr.Santos

## 2018-02-20 NOTE — Assessment & Plan Note (Signed)
Patient complained of scrotal pain during his most recent admission. Scrotal US showed a complex left extratesticular/epididymal fluid collection or mass. He was treated with Keflex with resolution of symptoms. He was referred to urology and has a follow up appointment with them next week.

## 2018-02-20 NOTE — Progress Notes (Signed)
Hypoglycemic Event  CBG: 55  Treatment: # Glucose tablets and  Power Ade  Symptoms: Nothing  Follow-up CBG: Time: 1540 CBG Result: 83  Possible Reasons for Event: Did not eat  Comments/MD notified: Dr. Reva Bores, Trey Sailors

## 2018-02-20 NOTE — Assessment & Plan Note (Signed)
[name] presents for HTN follow up.  His diuretics were held during his most recent admission and at discharge due to intractable nausea and vomiting.  Since being home he has been taking carvedilol 25 mg twice daily, clonidine 0.3 mg twice daily, and amlodipine 5 mg daily.  Blood pressure today is 186/101 and 170/100 when rechecked. He does look volume up on exam and now that N/V has resolved we will restart his home diuretics as he has been well controlled on these before.  -Resume Lasix 80 BID and metolozone 2.5mg  every other day  -BMP ordered to check renal function  -Follow up in 1-2 weeks

## 2018-02-20 NOTE — Progress Notes (Signed)
   CC: Hospital follow up for complicated UTI and epididymitis  HPI:  [name] is a 43 y.o. year-old male with PMH listed below who presents to clinic for hospital follow-up for complicated UTI and epididymitis. Please see problem based assessment and plan for further details.   Past Medical History:  Diagnosis Date  . Acute venous embolism and thrombosis of deep vessels of proximal lower extremity ([place]) 07/19/2011  . Anemia   . Chest pain, neg MI, normal coronaries by cath 02/18/2013  . CKD (chronic kidney disease) stage 3, GFR 30-59 ml/min (HCC) 02/19/2013  . Colon polyps   . Diabetic ulcer of right foot ([place])   . DVT (deep venous thrombosis) ([place]) 09/2002   patient reports additional DVTs in '06 & '11 (unconfirmed)  . GERD (gastroesophageal reflux disease)   . History of blood transfusion    "related to OR" (10/31/2016)  . Hyperlipidemia 02/19/2013  . Hypertension   . Nausea & vomiting    "constant for the last couple weeks" (10/31/2016)  . Nausea and vomiting 06/08/2017  . Nephrotic syndrome   . Obesity    BMI 44, weight 346 pounds 01/30/14  . Prosthesis adjustment 08/17/2016  . Pulmonary embolism ([place]) 09/2002   treated with 6 months of warfarin  . Pyelonephritis 02/02/2018  . Type I diabetes mellitus ([place]) dx'd 2001   Review of Systems:   Review of Systems  Constitutional: Negative for chills, fever and malaise/fatigue.  Respiratory: Negative for cough and shortness of breath.   Cardiovascular: Positive for leg swelling. Negative for chest pain.  Genitourinary: Negative for dysuria, flank pain, frequency, hematuria and urgency.  Neurological: Negative for dizziness and headaches.    Physical Exam: Vitals:   02/20/18 1420  BP: (!) 186/101  Pulse: 96  Temp: 98 F (36.7 C)  TempSrc: Oral  SpO2: 98%  Height: 6\' 2"  (1.88 m)   General: obese male, well-appearing, in NAD  Cardiac: regular rate and rhythm, nl S1/S2, no murmurs, rubs or gallops,JVD difficult to assess  due to body habitus  Pulm: CTAB, no wheezes or crackles, no increased work of breathing on room air  Ext: warm and well perfused, 1+ pitting edema on LLE, s/p R BKA   Assessment & Plan:   See Encounters Tab for problem based charting.  Patient discussed with Dr. Eppie Gibson

## 2018-02-21 ENCOUNTER — Telehealth: Payer: Self-pay | Admitting: Endocrinology

## 2018-02-21 ENCOUNTER — Telehealth: Payer: Self-pay | Admitting: *Deleted

## 2018-02-21 LAB — BMP8+ANION GAP
ANION GAP: 13 mmol/L (ref 10.0–18.0)
BUN/Creatinine Ratio: 23 — ABNORMAL HIGH (ref 9–20)
BUN: 45 mg/dL — ABNORMAL HIGH (ref 6–24)
CHLORIDE: 106 mmol/L (ref 96–106)
CO2: 20 mmol/L (ref 20–29)
Calcium: 9 mg/dL (ref 8.7–10.2)
Creatinine, Ser: 1.96 mg/dL — ABNORMAL HIGH (ref 0.76–1.27)
GFR calc Af Amer: 48 mL/min/{1.73_m2} — ABNORMAL LOW (ref >59)
GFR calc non Af Amer: 41 mL/min/{1.73_m2} — ABNORMAL LOW (ref >59)
Glucose: 60 mg/dL — ABNORMAL LOW (ref 65–99)
Potassium: 4.7 mmol/L (ref 3.5–5.2)
Sodium: 139 mmol/L (ref 134–144)

## 2018-02-21 NOTE — Telephone Encounter (Signed)
Unclear whether his blood sugars are low with 60 units or was this before the dosage reduction

## 2018-02-21 NOTE — Telephone Encounter (Signed)
Thank you :)

## 2018-02-21 NOTE — Telephone Encounter (Signed)
Pt called stated he is responding to dr Isac Sarna call from this am. Fasting cbg 148 Pt states he is calling dr Ronnie Derby office as soon as he hangs up with triage, will check chart for note of call to dr Dwyane Dee

## 2018-02-21 NOTE — Telephone Encounter (Signed)
Patient stated he seen his PCP this week and they checked his A1C which was 5.6. They Adjusted his insulin from 130 to 60.  He said he is having some dangerously low blood sugars.  he had a Blood sugar of 55.  Patient said he is in the gym every day and eating healthy like he has been advised to do. He is not sure what he is needing to do.   The Patient has scheduled an appt for March 10th @ 3:15pm

## 2018-02-21 NOTE — Telephone Encounter (Signed)
He will stop taking NovoLog in the morning and take only 15 units with other meals. Need to see if he can be seen earlier than 3/10 and he needs to make sure he keeps his appointment

## 2018-02-21 NOTE — Addendum Note (Signed)
Addended by: Oval Linsey D on: 02/21/2018 10:02 AM   Modules accepted: Level of Service

## 2018-02-21 NOTE — Progress Notes (Signed)
Patient ID: [name], male   DOB: 02/26/1975, 43 y.o.   MRN: [id number]  Case discussed with Dr. Isac Sarna at the time of the visit. We reviewed the resident's history and exam and pertinent patient test results. I agree with the assessment, diagnosis, and plan of care documented in the resident's note.  With an Hgb A1C at 5.6 it appears his glycemic control is too tight.  He was asked to discuss backing down on his insulin dosing with his endocrinologist in the AM.

## 2018-02-21 NOTE — Telephone Encounter (Signed)
Spoke with patient he is aware of instructions and I moved him up to the only time and date he could do which is this [place]

## 2018-02-21 NOTE — Telephone Encounter (Signed)
Do you want to see the patient sooner?

## 2018-02-21 NOTE — Telephone Encounter (Signed)
Pt has f/u in clinic, has been having low cbg, has been treated and advised

## 2018-02-21 NOTE — Telephone Encounter (Signed)
Pt stated that he checked his blood sugar at 7:24am and it was 34, after taking insulin, and eating, but he was working out at this time. He got his blood sugar back up.  Later that afternoon, he took his 25 units of insulin and then went to his PCP and before he left that office, his sugar was 55. Before leaving the office, he got it back up to 7.  Sugar is currently at 131, and he did not take his 25 units Novolog. This morning he took 50 units of Tresiba, 25 units of Novolog, and 1.2 of Victoza.

## 2018-02-24 ENCOUNTER — Other Ambulatory Visit: Payer: Self-pay | Admitting: Internal Medicine

## 2018-02-25 ENCOUNTER — Ambulatory Visit: Payer: Medicare Other | Admitting: Endocrinology

## 2018-02-26 ENCOUNTER — Ambulatory Visit (INDEPENDENT_AMBULATORY_CARE_PROVIDER_SITE_OTHER): Payer: Medicare Other | Admitting: Endocrinology

## 2018-02-26 ENCOUNTER — Other Ambulatory Visit: Payer: Self-pay

## 2018-02-26 ENCOUNTER — Encounter: Payer: Self-pay | Admitting: Endocrinology

## 2018-02-26 VITALS — BP 130/64 | HR 74 | Ht 74.0 in

## 2018-02-26 DIAGNOSIS — E1165 Type 2 diabetes mellitus with hyperglycemia: Secondary | ICD-10-CM

## 2018-02-26 DIAGNOSIS — Z794 Long term (current) use of insulin: Secondary | ICD-10-CM | POA: Diagnosis not present

## 2018-02-26 NOTE — Progress Notes (Signed)
Patient ID: [name], male   DOB: [birth year], 43 y.o.   MRN: [id number]          Reason for Appointment: Follow-up for Type 2 Diabetes  Referring physician: Jannette Fogo   History of Present Illness:          Date of diagnosis of type 2 diabetes mellitus:  2001      His weight is about 400 pounds at the time of diagnosis He does not know what his initial blood sugars and circumstances were but he apparently was started on insulin initially He was not able to tolerate metformin because of diarrhea Subsequently has been on insulin only, level of control not available but his A1c was significantly high at 12.8 in 2015  Background history:    Recent history:   INSULIN regimen is:  60 units Tresiba, NovoLog 15 U tid     Non-insulin hypoglycemic drugs the patient is taking are: Victoza 1.2 mg daily  His A1c is now 5.7 compared to 8.7   Current management, blood sugar patterns and problems identified:  He has blood sugar readings only since 2/12 because he thinks he lost his old meter  After his hospitalization last month he has had a significantly decreased appetite  However he says that he is trying to make himself weight because of taking insulin  He had an episode of hypoglycemia on 2/12 when he took his NovoLog and exercised at the gym and got symptomatic.  He says he was treated with Powerade and peppermint candy  Currently does not carry any glucose tablets with him for hypoglycemia  Recently his blood sugars are mostly higher fasting with only one high reading of 205 at bedtime last Saturday  However fasting readings have been below 130 a couple of times also  Continues to take Victoza, did not have any abdominal pain when he was hospitalized  Previously was taking 130 units of Tresiba       Side effects from medications have been: Diarrhea with metformin, vomiting with Ozempic  Compliance with the medical regimen: Variable Hypoglycemia:   As above  Glucose  monitoring:  done <1 a day         Glucometer:   ACCU-CHEK [employer] Blood sugar readings from download since 2/12   PRE-MEAL Fasting Lunch Dinner Bedtime Overall  Glucose range:  105-165      Mean/median:      110   POST-MEAL PC Breakfast PC Lunch PC Dinner  Glucose range:  37-146  102-131  83-205  Mean/median:         Self-care:    Typical meal intake: Breakfast is egg whites, some oatmeal/grits and banana.  Usually small portions of meat or other protein at lunch and dinner and only a small amount of sweet potato at lunch or carbohydrate              Dietician visit, most recent: Unknown               Exercise:  Weights, resistance and some cardio at the gym, recently none  Previously was doing 2 sessions of 1 hour each  Weight history:  Wt Readings from Last 3 Encounters:  02/14/18 (!) 396 lb 13.3 oz (180 kg)  02/04/18 (!) 386 lb 14.5 oz (175.5 kg)  12/04/17 (!) 410 lb 9.6 oz (186.2 kg)    Glycemic control:   Lab Results  Component Value Date   HGBA1C 5.6 02/20/2018   HGBA1C 8.7 (H) 09/17/2017  HGBA1C 6.2 06/05/2017   Lab Results  Component Value Date   MICROALBUR 160.61 (H) 08/14/2013   LDLCALC 91 06/05/2017   CREATININE 1.96 (H) 02/20/2018   Lab Results  Component Value Date   MICRALBCREAT 818.2 (H) 08/14/2013    Lab Results  Component Value Date   FRUCTOSAMINE 263 12/04/2017      Allergies as of 02/26/2018      Reactions   Reglan [metoclopramide] Other (See Comments)   Dysphoric reaction      Medication List       Accurate as of February 26, 2018  3:35 PM. Always use your most recent med list.        accu-chek softclix lancets Use to check blood sugars 5 times a day. Dx code:E11.9. Insulin dependent.   ACCU-CHEK SOFTCLIX LANCETS lancets USE TO CHECK BLOOD SUGAR 5 TIMES DAILY. DX CODE E11.9.   allopurinol 100 MG tablet Commonly known as:  ZYLOPRIM TAKE 2 TABLETS BY MOUTH ONCE DAILY   Alpha-Lipoic Acid 200 MG Caps Take 1 capsule (200  mg total) by mouth daily.   amLODipine 5 MG tablet Commonly known as:  NORVASC TAKE 1 TABLET BY MOUTH ONCE DAILY   apixaban 5 MG Tabs tablet Commonly known as:  ELIQUIS Take 1 tablet (5 mg total) by mouth 2 (two) times daily.   B-12 2500 MCG Subl Place 2,500 mcg under the tongue daily.   calcitRIOL 0.25 MCG capsule Commonly known as:  ROCALTROL TAKE 1 CAPSULE BY MOUTH ONCE DAILY   carvedilol 25 MG tablet Commonly known as:  COREG TAKE 1 TABLET BY MOUTH TWICE DAILY   cloNIDine 0.3 MG tablet Commonly known as:  CATAPRES Take 1 tablet (0.3 mg total) by mouth 2 (two) times daily.   FISH OIL PO Take 1 capsule by mouth at bedtime.   gabapentin 100 MG capsule Commonly known as:  NEURONTIN Take 100 mg by mouth 3 (three) times daily.   glucose blood test strip Commonly known as:  ACCU-CHEK AVIVA PLUS 1 each by Other route 3 (three) times daily. Use as instructed to check blood sugar 3 times daily. DX:E11.65   hydrALAZINE 50 MG tablet Commonly known as:  APRESOLINE Take 50 mg by mouth 4 (four) times daily.   insulin aspart 100 UNIT/ML injection Commonly known as:  novoLOG Inject 15 Units into the skin 3 (three) times daily before meals. INJECT 15 UNITS UNDER THE SKIN THREE TIMES DAILY WITH MEALS.   Insulin Degludec 200 UNIT/ML Sopn Inject 60 Units into the skin daily.   loratadine 10 MG tablet Commonly known as:  CLARITIN Take 10 mg by mouth daily as needed (seasonal allergies).   lovastatin 20 MG tablet Commonly known as:  MEVACOR TAKE 1 TABLET BY MOUTH ONCE DAILY   oxybutynin 5 MG tablet Commonly known as:  DITROPAN Take 5 mg by mouth 3 (three) times daily.   Pen Needles 3/16" 31G X 5 MM Misc Use five times daily   Insulin Pen Needle 31G X 5 MM Misc Commonly known as:  B-D UF III MINI PEN NEEDLES USE  4 TIMES DAILY   polyethylene glycol packet Commonly known as:  MIRALAX / GLYCOLAX Take 17 g by mouth daily as needed for mild constipation.   promethazine  12.5 MG tablet Commonly known as:  PHENERGAN Take 1 tablet (12.5 mg total) by mouth every 8 (eight) hours as needed for nausea or vomiting.   senna-docusate 8.6-50 MG tablet Commonly known as:  Senokot-S Take 2 tablets by  mouth daily as needed for mild constipation.   VICTOZA 18 MG/3ML Sopn Generic drug:  liraglutide INJECT 1.2MG  (0.2 MLS) SUBCUTANEOUSLY ONCE DAILY AT THE SAME TIME   zinc sulfate 220 (50 Zn) MG capsule Take 220 mg by mouth daily.       Allergies:  Allergies  Allergen Reactions  . Reglan [Metoclopramide] Other (See Comments)    Dysphoric reaction    Past Medical History:  Diagnosis Date  . Acute venous embolism and thrombosis of deep vessels of proximal lower extremity ([facility]) 07/19/2011  . Anemia   . Chest pain, neg MI, normal coronaries by cath 02/18/2013  . CKD (chronic kidney disease) stage 3, GFR 30-59 ml/min ([facility]) 02/19/2013  . Colon polyps   . Diabetic ulcer of right foot ([place])   . DVT (deep venous thrombosis) ([place]) 09/2002   patient reports additional DVTs in '06 & '11 (unconfirmed)  . GERD (gastroesophageal reflux disease)   . History of blood transfusion    "related to OR" (10/31/2016)  . Hyperlipidemia 02/19/2013  . Hypertension   . Nausea & vomiting    "constant for the last couple weeks" (10/31/2016)  . Nausea and vomiting 06/08/2017  . Nephrotic syndrome   . Obesity    BMI 44, weight 346 pounds 01/30/14  . Prosthesis adjustment 08/17/2016  . Pulmonary embolism ([place]) 09/2002   treated with 6 months of warfarin  . Pyelonephritis 02/02/2018  . Type I diabetes mellitus ([place]) dx'd 2001    Past Surgical History:  Procedure Laterality Date  . AMPUTATION Right 12/22/2013   Procedure: AMPUTATION BELOW KNEE;  Surgeon: [name], MD;  Location: [place];  Service: Orthopedics;  Laterality: Right;  . AMPUTATION Right 02/19/2014   Procedure: RIGHT ABOVE KNEE AMPUTATION ;  Surgeon: [name], MD;  Location: [place];  Service: Orthopedics;  Laterality: Right;   . CARDIAC CATHETERIZATION  02/18/2013   normal coronaries  . ESOPHAGOGASTRODUODENOSCOPY N/A 11/02/2016   Procedure: ESOPHAGOGASTRODUODENOSCOPY (EGD);  Surgeon: [name], MD;  [facility] ENDOSCOPY;  Service: Endoscopy;  Laterality: N/A;  . I&D EXTREMITY Right 12/14/2013   Procedure: IRRIGATION AND DEBRIDEMENT RIGHT FOOT;  Surgeon: [name], MD;  Location: [place];  Service: Orthopedics;  Laterality: Right;  . INCISION AND DRAINAGE ABSCESS  2007; 2015   "back"  . INCISION AND DRAINAGE OF WOUND Right 12/22/2013   Procedure: I&D RIGHT BUTTOCK;  Surgeon: [name], MD;  Location: [place];  Service: Orthopedics;  Laterality: Right;  . LEFT HEART CATHETERIZATION WITH CORONARY ANGIOGRAM N/A 02/18/2013   Procedure: LEFT HEART CATHETERIZATION WITH CORONARY ANGIOGRAM;  Surgeon: [name], MD;  Location: [facility] CATH LAB;  Service: Cardiovascular;  Laterality: N/A;    Family History  Problem Relation Age of Onset  . Heart disease Mother   . Diabetes Mother   . Diabetes Maternal Uncle   . Diabetes Cousin     Social History:  reports that he has never smoked. He has never used smokeless tobacco. He reports current alcohol use. He reports that he does not use drugs.   Review of Systems    Lipid history: On Mevacor with good control as follows:    Lab Results  Component Value Date   CHOL 150 06/05/2017   HDL 33.30 (L) 06/05/2017   LDLCALC 91 06/05/2017   TRIG 127.0 06/05/2017   CHOLHDL 4 06/05/2017           Hypertension: He is taking clonidine, Norvasc and Coreg followed by nephrologist  BP Readings  from Last 3 Encounters:  02/26/18 130/64  02/20/18 (!) 186/101  02/14/18 (!) 142/72     Most recent eye exam was in 08/2017  Most recent foot exam: 4/09   Complications of diabetes: Nephropathy with renal insufficiency and proteinuria, unknown status of retinopathy, erectile dysfunction  Followed by nephrologist for CKD also  Lab Results  Component Value Date    CREATININE 1.96 (H) 02/20/2018   CREATININE 2.62 (H) 02/13/2018   CREATININE 2.45 (H) 02/12/2018     LABS:  Office Visit on 02/20/2018  Component Date Value Ref Range Status  . Glucose 02/20/2018 60* 65 - 99 mg/dL Final  . BUN 02/20/2018 45* 6 - 24 mg/dL Final  . Creatinine, Ser 02/20/2018 1.96* 0.76 - 1.27 mg/dL Final  . GFR calc non Af Amer 02/20/2018 41* >59 mL/min/1.73 Final  . GFR calc Af Amer 02/20/2018 48* >59 mL/min/1.73 Final  . BUN/Creatinine Ratio 02/20/2018 23* 9 - 20 Final  . Sodium 02/20/2018 139  134 - 144 mmol/L Final  . Potassium 02/20/2018 4.7  3.5 - 5.2 mmol/L Final  . Chloride 02/20/2018 106  96 - 106 mmol/L Final  . CO2 02/20/2018 20  20 - 29 mmol/L Final  . Anion Gap 02/20/2018 13.0  10.0 - 18.0 mmol/L Final  . Calcium 02/20/2018 9.0  8.7 - 10.2 mg/dL Final  . Hemoglobin A1C 02/20/2018 5.6  4.0 - 5.6 % Final  . Glucose-Capillary 02/20/2018 55* 70 - 99 mg/dL Final  . Comment 1 02/20/2018 Call MD NNP PA CNM   Final  . Comment 2 02/20/2018 Document in Chart   Final  . Glucose-Capillary 02/20/2018 83  70 - 99 mg/dL Final  . Comment 1 02/20/2018 Notify RN   Final  . Comment 2 02/20/2018 Document in Chart   Final    Physical Examination:  BP 130/64 (BP Location: Right Arm, Patient Position: Sitting, Cuff Size: Large)   Pulse 74   Ht 6\' 2"  (1.88 m)   SpO2 98%   BMI 50.95 kg/m       ASSESSMENT:  Diabetes type 2, with morbid obesity  See history of present illness for detailed discussion of current diabetes management, blood sugar patterns and problems identified  His last A1c was 8.7 and is now 5.8  Although his blood sugars are difficult to assess because of recent home readings not available except for the last week his A1c indicates markedly better control This may be also influenced by his recent decreased intake and weight loss after hospitalization He was not doing better with his diet on the last visit and may have changed his lifestyle  somewhat  More recently requiring much less insulin including basal which is reduced by 60% His fasting readings are mildly increased but as low as 105  His recent hospitalization records, labs and recent treatment plan and medications were reviewed in detail  LIPIDS: Last lipid panel 5/19  PLAN:    He will stop NovoLog unless his blood sugars are consistently high after meals or if he is eating a full meal with carbohydrates  He can try taking NovoLog right after eating  Stop Victoza for now unless he is able to eat full meals again  No change in [place]  To bring monitor download on each visit with complete records  Once his blood sugars start going up in the morning he will increase his [place] by 5 units at a time if they are outside the desired range that was given to him  Needs reassessment of his lipid panel which is overdue  Counseling time on subjects discussed in assessment and plan sections is over 50% of today's 25 minute visit   Patient Instructions  Stop Victoza   Check blood sugars on waking up days a week  Also check blood sugars about 2 hours after meals and do this after different meals by rotation  Recommended blood sugar levels on waking up are 80-120 and about 2 hours after meal is 130-160  Please bring your blood sugar monitor to each visit, thank you  Keep fasting sugars in range with 5 unit changes   Novolog after meals 5-10 units for now, none with exercise  Restart Victoza when eating normally then 0.6 for 7 days then 1.2mg    Counseling time on subjects discussed in assessment and plan sections is over 50% of today's 25 minute visit   [name] 02/26/2018, 3:35 PM   Note: This office note was prepared with Dragon voice recognition system technology. Any transcriptional errors that result from this process are unintentional.

## 2018-02-26 NOTE — Patient Instructions (Addendum)
Stop Victoza   Check blood sugars on waking up days a week  Also check blood sugars about 2 hours after meals and do this after different meals by rotation  Recommended blood sugar levels on waking up are 80-120 and about 2 hours after meal is 130-160  Please bring your blood sugar monitor to each visit, thank you  Keep fasting sugars in range with 5 unit changes   Novolog after meals 5-10 units for now, none with exercise  Restart Victoza when eating normally then 0.6 for 7 days then 1.2mg 

## 2018-02-27 NOTE — Progress Notes (Signed)
Cardiology Office Note    Date:  02/28/2018   ID:  [name], DOB [birth year], MRN [id number]  PCP:  [name], DO  Cardiologist:  Fransico Him, MD   Chief Complaint  Patient presents with  . Congestive Heart Failure  . Hyperlipidemia    History of Present Illness:  [name] is a 43 y.o. male who is being seen today for the evaluation of Chronic diastolic CHF at the request of Lucious Groves, DO.  This is a 43yo male with a history of DCV/PE, CKD stage 3, DM, HTN, hyperlipidemia and morbid obesity weighing around 400 pounds.  He has a history of chronic diastolic CHF and was recently diagnosed with obstructive sleep apnea and is referred here for further evaluation.  His Epworth sleepiness score is 8.  Sleep study done 11/06/2017 showed overall mild obstructive sleep apnea with an AHI of 9.8/h but severe during REM sleep with an AHI of 33.8/h.  Oxygen saturations dropped to 86% and there was moderate snoring.  He is referred here to get on CPAP therapy.  He takes furosemide and metolazone at home.  He denies any chest pain or pressure, SOB, DOE, PND, orthopnea, LE edema, dizziness, palpitations or syncope. He is compliant with his meds and is tolerating meds with no SE.     Past Medical History:  Diagnosis Date  . Acute venous embolism and thrombosis of deep vessels of proximal lower extremity ([place]) 07/19/2011  . Anemia   . Chest pain, neg MI, normal coronaries by cath 02/18/2013  . CKD (chronic kidney disease) stage 3, GFR 30-59 ml/min (H[facility]) 02/19/2013  . Colon polyps   . Diabetic ulcer of right foot ([place])   . DVT (deep venous thrombosis) ([place]) 09/2002   patient reports additional DVTs in '06 & '11 (unconfirmed)  . GERD (gastroesophageal reflux disease)   . History of blood transfusion    "related to OR" (10/31/2016)  . Hyperlipidemia 02/19/2013  . Hypertension   . Nausea & vomiting    "constant for the last couple weeks" (10/31/2016)  . Nephrotic syndrome    . Obesity    BMI 44, weight 346 pounds 01/30/14  . OSA (obstructive sleep apnea) 02/28/2018   Mild obstructive sleep apnea with an AHI of 9.8/h but severe during REM sleep with an AHI of 33.8/h.  Oxygen saturations dropped to 86% and there was moderate snoring  . Prosthesis adjustment 08/17/2016  . Pulmonary embolism ([place]) 09/2002   treated with 6 months of warfarin  . Pyelonephritis 02/02/2018  . Type I diabetes mellitus ([name]) dx'd 2001    Past Surgical History:  Procedure Laterality Date  . AMPUTATION Right 12/22/2013   Procedure: AMPUTATION BELOW KNEE;  Surgeon: [name], MD;  Location: [facility];  Service: Orthopedics;  Laterality: Right;  . AMPUTATION Right 02/19/2014   Procedure: RIGHT ABOVE KNEE AMPUTATION ;  Surgeon: [name], MD;  Location: [facility];  Service: Orthopedics;  Laterality: Right;  . CARDIAC CATHETERIZATION  02/18/2013   normal coronaries  . ESOPHAGOGASTRODUODENOSCOPY N/A 11/02/2016   Procedure: ESOPHAGOGASTRODUODENOSCOPY (EGD);  Surgeon: [name], MD;  [facility] ENDOSCOPY;  Service: Endoscopy;  Laterality: N/A;  . I&D EXTREMITY Right 12/14/2013   Procedure: IRRIGATION AND DEBRIDEMENT RIGHT FOOT;  Surgeon: [name], MD;  Location: [facility];  Service: Orthopedics;  Laterality: Right;  . INCISION AND DRAINAGE ABSCESS  2007; 2015   "back"  . INCISION AND DRAINAGE OF WOUND Right 12/22/2013   Procedure: I&D RIGHT  BUTTOCK;  Surgeon: [name], MD;  Location: [facility];  Service: Orthopedics;  Laterality: Right;  . LEFT HEART CATHETERIZATION WITH CORONARY ANGIOGRAM N/A 02/18/2013   Procedure: LEFT HEART CATHETERIZATION WITH CORONARY ANGIOGRAM;  Surgeon: [name], MD;  Location: [facility] CATH LAB;  Service: Cardiovascular;  Laterality: N/A;    Current Medications: Current Meds  Medication Sig  . ACCU-CHEK SOFTCLIX LANCETS lancets USE TO CHECK BLOOD SUGAR 5 TIMES DAILY. DX CODE E11.9.  . allopurinol (ZYLOPRIM) 100 MG tablet TAKE 2 TABLETS BY MOUTH ONCE DAILY (Patient  taking differently: Take 200 mg by mouth daily. )  . Alpha-Lipoic Acid 200 MG CAPS Take 1 capsule (200 mg total) by mouth daily.  Marland Kitchen amLODipine (NORVASC) 5 MG tablet TAKE 1 TABLET BY MOUTH ONCE DAILY (Patient taking differently: Take 5 mg by mouth daily. )  . apixaban (ELIQUIS) 5 MG TABS tablet Take 1 tablet (5 mg total) by mouth 2 (two) times daily.  . calcitRIOL (ROCALTROL) 0.25 MCG capsule TAKE 1 CAPSULE BY MOUTH ONCE DAILY (Patient taking differently: Take 0.25 mcg by mouth daily. )  . carvedilol (COREG) 25 MG tablet TAKE 1 TABLET BY MOUTH TWICE DAILY  . cloNIDine (CATAPRES) 0.3 MG tablet Take 1 tablet (0.3 mg total) by mouth 2 (two) times daily.  . Cyanocobalamin (B-12) 2500 MCG SUBL Place 2,500 mcg under the tongue daily.  . furosemide (LASIX) 40 MG tablet Take 80 mg by mouth 2 (two) times daily.  Marland Kitchen gabapentin (NEURONTIN) 100 MG capsule Take 100 mg by mouth 3 (three) times daily.  Marland Kitchen gabapentin (NEURONTIN) 100 MG capsule Take 1 capsule (100 mg total) by mouth 3 (three) times daily.  Marland Kitchen glucose blood (ACCU-CHEK AVIVA PLUS) test strip 1 each by Other route 3 (three) times daily. Use as instructed to check blood sugar 3 times daily. DX:E11.65  . hydrALAZINE (APRESOLINE) 50 MG tablet Take 50 mg by mouth 4 (four) times daily.  . insulin aspart (NOVOLOG) 100 UNIT/ML injection Inject 15 Units into the skin 3 (three) times daily before meals. INJECT 15 UNITS UNDER THE SKIN THREE TIMES DAILY WITH MEALS.  Marland Kitchen Insulin Degludec 200 UNIT/ML SOPN Inject 60 Units into the skin daily.  . Insulin Pen Needle (B-D UF III MINI PEN NEEDLES) 31G X 5 MM MISC USE  4 TIMES DAILY  . Insulin Pen Needle (PEN NEEDLES 3/16") 31G X 5 MM MISC Use five times daily  . Lancet Devices (ACCU-CHEK SOFTCLIX) lancets Use to check blood sugars 5 times a day. Dx code:E11.9. Insulin dependent.  Marland Kitchen loratadine (CLARITIN) 10 MG tablet Take 10 mg by mouth daily as needed (seasonal allergies).   Marland Kitchen lovastatin (MEVACOR) 20 MG tablet TAKE 1 TABLET  BY MOUTH ONCE DAILY (Patient taking differently: Take 20 mg by mouth daily at 6 PM. )  . Omega-3 Fatty Acids (FISH OIL PO) Take 1 capsule by mouth at bedtime.  Marland Kitchen oxybutynin (DITROPAN) 5 MG tablet Take 5 mg by mouth 3 (three) times daily.  . polyethylene glycol (MIRALAX / GLYCOLAX) packet Take 17 g by mouth daily as needed for mild constipation.   . promethazine (PHENERGAN) 12.5 MG tablet Take 1 tablet (12.5 mg total) by mouth every 8 (eight) hours as needed for nausea or vomiting.  . senna-docusate (SENOKOT-S) 8.6-50 MG tablet Take 2 tablets by mouth daily as needed for mild constipation.  Marland Kitchen VICTOZA 18 MG/3ML SOPN INJECT 1.2MG  (0.2 MLS) SUBCUTANEOUSLY ONCE DAILY AT THE SAME TIME (Patient taking differently: Inject 1.2 mg into the skin  daily. )  . zinc sulfate 220 (50 Zn) MG capsule Take 220 mg by mouth daily.    Allergies:   Reglan [metoclopramide]   Social History   Socioeconomic History  . Marital status: Single    Spouse name: Not on file  . Number of children: 0  . Years of education: 15.5  . Highest education level: Not on file  Occupational History  . Occupation: [employer]  . Occupation: Lexicographer: [employer]   Social Needs  . Financial resource strain: Not on file  . Food insecurity:    Worry: Not on file    Inability: Not on file  . Transportation needs:    Medical: Not on file    Non-medical: Not on file  Tobacco Use  . Smoking status: Never Smoker  . Smokeless tobacco: Never Used  Substance and Sexual Activity  . Alcohol use: Yes    Comment: 10/31/2016 "a few beers q 6 months or so"  . Drug use: No  . Sexual activity: Yes  Lifestyle  . Physical activity:    Days per week: Not on file    Minutes per session: Not on file  . Stress: Not on file  Relationships  . Social connections:    Talks on phone: Not on file    Gets together: Not on file    Attends religious service: Not on file    Active member of club or organization: Not on file    Attends  meetings of clubs or organizations: Not on file    Relationship status: Not on file  Other Topics Concern  . Not on file  Social History Narrative   Financial assistance approved for 100% discount at [facility] and has [facility] card per [employer]   09/01/2009.      Lives in [place] with mother and sister.              Family History:  The patient's family history includes Diabetes in his cousin, maternal uncle, and mother; Heart disease in his mother.   ROS:   Please see the history of present illness.    ROS All other systems reviewed and are negative.  No flowsheet data found.     PHYSICAL EXAM:   VS:  BP (!) 148/86   Pulse 83   Ht 6\' 2"  (1.88 m)   Wt (!) 387 lb (175.5 kg)   SpO2 96%   BMI 49.69 kg/m    GEN: Well nourished, well developed, in no acute distress  HEENT: normal  Neck: no JVD, carotid bruits, or masses Cardiac: RRR; no murmurs, rubs, or gallops,no edema.  Intact distal pulses bilaterally.  Respiratory:  clear to auscultation bilaterally, normal work of breathing GI: soft, nontender, nondistended, + BS MS: no deformity or atrophy  Skin: warm and dry, no rash Neuro:  Alert and Oriented x 3, Strength and sensation are intact Psych: euthymic mood, full affect  Wt Readings from Last 3 Encounters:  02/28/18 (!) 387 lb (175.5 kg)  02/14/18 (!) 396 lb 13.3 oz (180 kg)  02/04/18 (!) 386 lb 14.5 oz (175.5 kg)      Studies/Labs Reviewed:   EKG:  EKG is not ordered today.    Recent Labs: 06/05/2017: TSH 2.15 02/02/2018: B Natriuretic Peptide 461.9 02/08/2018: ALT 28 02/10/2018: Magnesium 2.6 02/11/2018: Hemoglobin 9.1; Platelets 435 02/20/2018: BUN 45; Creatinine, Ser 1.96; Potassium 4.7; Sodium 139   Lipid Panel    Component Value Date/Time   CHOL 150  06/05/2017 0840   TRIG 127.0 06/05/2017 0840   HDL 33.30 (L) 06/05/2017 0840   CHOLHDL 4 06/05/2017 0840   VLDL 25.4 06/05/2017 0840   LDLCALC 91 06/05/2017 0840    Additional studies/ records that were  reviewed today include:  Office notes from PCP    ASSESSMENT:    1. Chronic diastolic heart failure ([place])   2. Chronic renal insufficiency, stage 3 (moderate) (HCC)   3. Morbid obesity ([place])   4. OSA (obstructive sleep apnea)      PLAN:  In order of problems listed above:  1.  Chronic diastolic CHF - he appears euvolemic on exam.  He has chronic lower extremity edema in his left lower extremity.  He will continue on lasix 80mg  BID.  His creatinine is stable at 1.96.    2.  CKD stage 3 - his baseline appears to be around 2.1-2.2.  His last creatinine was 1.96 after followup a week after diuretics restarted.  3.  Morbid Obesity - I have encouraged her to get into a routine exercise program and cut back on carbs and portions.   4.  Obstructive sleep apnea -his Epworth sleepiness score is 8.  Sleep study done 11/06/2017 showed overall mild obstructive sleep apnea with an AHI of 9.8/h but severe during REM sleep with an AHI of 33.8/h.  Oxygen saturations dropped to 86% and there was moderate snoring.  I have recommended proceeding with CPAP titration for his sleep disordered breathing.  He will follow-up with me after he is on CPAP therapy.    Medication Adjustments/Labs and Tests Ordered: Current medicines are reviewed at length with the patient today.  Concerns regarding medicines are outlined above.  Medication changes, Labs and Tests ordered today are listed in the Patient Instructions below.  There are no Patient Instructions on file for this visit.   Signed, Fransico Him, MD  02/28/2018 1:33 PM    [facility] [place], [place],   [id number] Phone: [phone]; Fax: [phone]

## 2018-02-28 ENCOUNTER — Encounter: Payer: Self-pay | Admitting: Cardiology

## 2018-02-28 ENCOUNTER — Ambulatory Visit (INDEPENDENT_AMBULATORY_CARE_PROVIDER_SITE_OTHER): Payer: Medicare Other | Admitting: Cardiology

## 2018-02-28 VITALS — BP 148/86 | HR 83 | Ht 74.0 in | Wt 387.0 lb

## 2018-02-28 DIAGNOSIS — I5032 Chronic diastolic (congestive) heart failure: Secondary | ICD-10-CM

## 2018-02-28 DIAGNOSIS — N3944 Nocturnal enuresis: Secondary | ICD-10-CM | POA: Diagnosis not present

## 2018-02-28 DIAGNOSIS — N183 Chronic kidney disease, stage 3 unspecified: Secondary | ICD-10-CM

## 2018-02-28 DIAGNOSIS — N451 Epididymitis: Secondary | ICD-10-CM | POA: Diagnosis not present

## 2018-02-28 DIAGNOSIS — G4733 Obstructive sleep apnea (adult) (pediatric): Secondary | ICD-10-CM | POA: Diagnosis not present

## 2018-02-28 DIAGNOSIS — R310 Gross hematuria: Secondary | ICD-10-CM | POA: Diagnosis not present

## 2018-02-28 HISTORY — DX: Obstructive sleep apnea (adult) (pediatric): G47.33

## 2018-02-28 NOTE — Addendum Note (Signed)
Addended by: Sarina Ill on: 02/28/2018 01:41 PM   Modules accepted: Orders

## 2018-02-28 NOTE — Patient Instructions (Signed)
Medication Instructions:  Your physician recommends that you continue on your current medications as directed. Please refer to the Current Medication list given to you today. If you need a refill on your cardiac medications before your next appointment, please call your pharmacy.   Lab work: None If you have labs (blood work) drawn today and your tests are completely normal, you will receive your results only by: Marland Kitchen MyChart Message (if you have MyChart) OR . A paper copy in the mail If you have any lab test that is abnormal or we need to change your treatment, we will call you to review the results.  Testing/Procedures: CPAP Titration   Follow-Up: As needed.

## 2018-03-01 ENCOUNTER — Emergency Department (HOSPITAL_COMMUNITY)
Admission: EM | Admit: 2018-03-01 | Discharge: 2018-03-02 | Disposition: A | Discharge status: Home / Self Care | Payer: Medicare Other | Attending: Emergency Medicine | Admitting: Emergency Medicine

## 2018-03-01 ENCOUNTER — Other Ambulatory Visit: Payer: Self-pay

## 2018-03-01 ENCOUNTER — Encounter (HOSPITAL_COMMUNITY): Payer: Self-pay | Admitting: *Deleted

## 2018-03-01 DIAGNOSIS — Z794 Long term (current) use of insulin: Secondary | ICD-10-CM | POA: Diagnosis not present

## 2018-03-01 DIAGNOSIS — I130 Hypertensive heart and chronic kidney disease with heart failure and stage 1 through stage 4 chronic kidney disease, or unspecified chronic kidney disease: Secondary | ICD-10-CM | POA: Insufficient documentation

## 2018-03-01 DIAGNOSIS — I5032 Chronic diastolic (congestive) heart failure: Secondary | ICD-10-CM | POA: Diagnosis not present

## 2018-03-01 DIAGNOSIS — M79662 Pain in left lower leg: Secondary | ICD-10-CM | POA: Diagnosis not present

## 2018-03-01 DIAGNOSIS — Z79899 Other long term (current) drug therapy: Secondary | ICD-10-CM | POA: Insufficient documentation

## 2018-03-01 DIAGNOSIS — R319 Hematuria, unspecified: Secondary | ICD-10-CM

## 2018-03-01 DIAGNOSIS — E1022 Type 1 diabetes mellitus with diabetic chronic kidney disease: Secondary | ICD-10-CM | POA: Diagnosis not present

## 2018-03-01 DIAGNOSIS — Z89611 Acquired absence of right leg above knee: Secondary | ICD-10-CM | POA: Diagnosis not present

## 2018-03-01 DIAGNOSIS — R1011 Right upper quadrant pain: Secondary | ICD-10-CM

## 2018-03-01 DIAGNOSIS — R1032 Left lower quadrant pain: Secondary | ICD-10-CM | POA: Diagnosis not present

## 2018-03-01 DIAGNOSIS — N183 Chronic kidney disease, stage 3 (moderate): Secondary | ICD-10-CM | POA: Insufficient documentation

## 2018-03-01 DIAGNOSIS — Z7901 Long term (current) use of anticoagulants: Secondary | ICD-10-CM | POA: Insufficient documentation

## 2018-03-01 DIAGNOSIS — K8020 Calculus of gallbladder without cholecystitis without obstruction: Secondary | ICD-10-CM | POA: Diagnosis not present

## 2018-03-01 LAB — COMPREHENSIVE METABOLIC PANEL
ALT: 19 U/L (ref 0–44)
AST: 18 U/L (ref 15–41)
Albumin: 3 g/dL — ABNORMAL LOW (ref 3.5–5.0)
Alkaline Phosphatase: 100 U/L (ref 38–126)
Anion gap: 6 (ref 5–15)
BUN: 58 mg/dL — ABNORMAL HIGH (ref 6–20)
CO2: 23 mmol/L (ref 22–32)
Calcium: 8.5 mg/dL — ABNORMAL LOW (ref 8.9–10.3)
Chloride: 106 mmol/L (ref 98–111)
Creatinine, Ser: 2.77 mg/dL — ABNORMAL HIGH (ref 0.61–1.24)
GFR calc Af Amer: 31 mL/min — ABNORMAL LOW (ref >60)
GFR, EST NON AFRICAN AMERICAN: 27 mL/min — AB (ref >60)
Glucose, Bld: 164 mg/dL — ABNORMAL HIGH (ref 70–99)
Potassium: 3.9 mmol/L (ref 3.5–5.1)
Sodium: 135 mmol/L (ref 135–145)
Total Bilirubin: 0.5 mg/dL (ref 0.3–1.2)
Total Protein: 6.5 g/dL (ref 6.5–8.1)

## 2018-03-01 LAB — URINALYSIS, ROUTINE W REFLEX MICROSCOPIC
BILIRUBIN URINE: NEGATIVE
GLUCOSE, UA: 50 mg/dL — AB
Ketones, ur: NEGATIVE mg/dL
Nitrite: NEGATIVE
Protein, ur: 100 mg/dL — AB
RBC / HPF: >50 RBC/hpf — ABNORMAL HIGH (ref 0–5)
Specific Gravity, Urine: 1.009 (ref 1.005–1.030)
pH: 5 (ref 5.0–8.0)

## 2018-03-01 LAB — CBC
HCT: 33.2 % — ABNORMAL LOW (ref 39.0–52.0)
Hemoglobin: 9.9 g/dL — ABNORMAL LOW (ref 13.0–17.0)
MCH: 27.4 pg (ref 26.0–34.0)
MCHC: 29.8 g/dL — ABNORMAL LOW (ref 30.0–36.0)
MCV: 92 fL (ref 80.0–100.0)
Platelets: 242 10*3/uL (ref 150–400)
RBC: 3.61 MIL/uL — ABNORMAL LOW (ref 4.22–5.81)
RDW: 14.3 % (ref 11.5–15.5)
WBC: 8 10*3/uL (ref 4.0–10.5)
nRBC: 0 % (ref 0.0–0.2)

## 2018-03-01 MED ORDER — SODIUM CHLORIDE 0.9% FLUSH: 3.0000 mL | Freq: Once | INTRAVENOUS | Status: DC

## 2018-03-01 NOTE — ED Notes (Signed)
Attempted to use urinal without success

## 2018-03-01 NOTE — ED Notes (Signed)
Pt remains in waiting room. Updated on wait for treatment room. 

## 2018-03-01 NOTE — ED Triage Notes (Addendum)
Pt Had a cystoscopy done earlier this week for hematuria; has noticed decreased blood in urine but would like it checked. Also noticed knot to L side of leg that is tender to the touch. Hx of DVT is on eliquis, in which he takes twice daily. Also reports lower abd pain

## 2018-03-02 ENCOUNTER — Ambulatory Visit (HOSPITAL_COMMUNITY): Admission: RE | Admit: 2018-03-02 | Payer: Medicare Other | Source: Ambulatory Visit

## 2018-03-02 ENCOUNTER — Emergency Department (HOSPITAL_COMMUNITY): Payer: Medicare Other

## 2018-03-02 DIAGNOSIS — K8020 Calculus of gallbladder without cholecystitis without obstruction: Secondary | ICD-10-CM | POA: Diagnosis not present

## 2018-03-02 DIAGNOSIS — R1011 Right upper quadrant pain: Secondary | ICD-10-CM | POA: Diagnosis not present

## 2018-03-02 LAB — LIPASE, BLOOD: Lipase: 38 U/L (ref 11–51)

## 2018-03-02 MED ORDER — OXYCODONE-ACETAMINOPHEN 5-325 MG PO TABS
1.0000 | ORAL_TABLET | Freq: Once | ORAL | Status: AC
  Administered 2018-03-02: 1 via ORAL
  Filled 2018-03-02: qty 1

## 2018-03-02 NOTE — ED Notes (Signed)
The pt has a painful swollen lt leg for awhile  bk amp on the rt

## 2018-03-02 NOTE — ED Provider Notes (Signed)
[facility] EMERGENCY DEPARTMENT Provider Note   CSN: [id number] Arrival date & time: 03/01/18  1900    History   Chief Complaint Chief Complaint  Patient presents with  . Hematuria  . Leg Pain    HPI [name] is a 43 y.o. male.     The history is provided by the patient and medical records.  Hematuria   Leg Pain     43 year old male with history of DVT and PE on Eliquis chronically, anemia, chronic kidney disease stage III, GERD, hyperlipidemia, hypertension, OSA, diabetes, obesity, nephrotic syndrome, right AKA secondary to diabetic ulcer of foot, presenting to the ED with multiple concerns.  1.  Hematuria-- this has been ongoing for about 3-4 weeks, waxing and waning in severity.  Seen by urology yesterday and had a cystoscopy which did not show any abnormal findings.  States hematuria seems to be getting better but is concerned that it has been prolonged for several weeks now.  He reports a little bit of discomfort with urination this morning, but thinks it was from instrumentation yesterday.  He is not having any current dysuria or penile discharge.  2.  Abdominal pain--epigastric and right upper abdomen.  This is been ongoing for about a week and a half.  States he has had some nausea but denies vomiting.  His appetite has been decreased from baseline because of this.  Does have history of gallstones the was found on CT last month as an incidental finding.  He did not have any follow-up for this.  3.  Knot on left leg--has been present for almost 2 weeks.  Localized to left lateral calf.  States area has become increasingly tender and firm.  He does not ambulate so cannot specify if worse with weightbearing, but does report very tender to the touch or if moving his leg.  He has not noticed any redness.  Does have slight increase swelling in the left lower leg compared with baseline.  He denies any new numbness or altered sensation of the leg.  Has been  compliant with his Eliquis.  Past Medical History:  Diagnosis Date  . Acute venous embolism and thrombosis of deep vessels of proximal lower extremity ([place]) 07/19/2011  . Anemia   . Chest pain, neg MI, normal coronaries by cath 02/18/2013  . CKD (chronic kidney disease) stage 3, GFR 30-59 ml/min (HCC) 02/19/2013  . Colon polyps   . Diabetic ulcer of right foot ([place])   . DVT (deep venous thrombosis) ([place]) 09/2002   patient reports additional DVTs in '06 & '11 (unconfirmed)  . GERD (gastroesophageal reflux disease)   . History of blood transfusion    "related to OR" (10/31/2016)  . Hyperlipidemia 02/19/2013  . Hypertension   . Nausea & vomiting    "constant for the last couple weeks" (10/31/2016)  . Nephrotic syndrome   . Obesity    BMI 44, weight 346 pounds 01/30/14  . OSA (obstructive sleep apnea) 02/28/2018   Mild obstructive sleep apnea with an AHI of 9.8/h but severe during REM sleep with an AHI of 33.8/h.  Oxygen saturations dropped to 86% and there was moderate snoring  . Prosthesis adjustment 08/17/2016  . Pulmonary embolism ([place]) 09/2002   treated with 6 months of warfarin  . Pyelonephritis 02/02/2018  . Type I diabetes mellitus ([place]) dx'd 2001    Patient Active Problem List   Diagnosis Date Noted  . OSA (obstructive sleep apnea) 02/28/2018  . Testicular pain   .  Dehydration   . Intractable vomiting   . Epididymitis   . Intractable nausea and vomiting 02/09/2018  . AKI (acute kidney injury) ([place])   . Complicated UTI (urinary tract infection) 02/02/2018  . Chronic diastolic heart failure ([place]) 02/02/2018  . Urinary frequency 01/17/2018  . Urinary incontinence 01/17/2018  . Mild obstructive sleep apnea 01/17/2018  . Healthcare maintenance 08/15/2017  . Erectile dysfunction 04/22/2017  . Nocturnal enuresis 04/19/2017  . Blood in stool 03/07/2017  . History of nausea and vomiting 10/31/2016  . History of pulmonary embolus (PE) 06/09/2016  . Normocytic anemia 01/30/2014  .  Constipation   . Unilateral AKA, right ([place]) 12/26/2013  . GERD (gastroesophageal reflux disease) 08/14/2013  . Hyperlipidemia 02/19/2013  . S/P cardiac cath, 02/18/13, normal coronaries 02/19/2013  . Chronic renal insufficiency, stage 3 (moderate) ([place]) 02/19/2013  . Nephrotic syndrome 02/12/2012  . Uncontrolled hypertension 07/24/2011  . Long term current use of anticoagulant therapy 07/19/2011  . History of DVT (deep vein thrombosis) 07/28/2009  . PROTEINURIA, MILD 02/01/2007  . Morbid obesity ([place]) 10/26/2005  . Insulin dependent type 2 diabetes mellitus, controlled ([place]) 01/09/2002    Past Surgical History:  Procedure Laterality Date  . AMPUTATION Right 12/22/2013   Procedure: AMPUTATION BELOW KNEE;  Surgeon: [name], MD;  Location: [facility];  Service: Orthopedics;  Laterality: Right;  . AMPUTATION Right 02/19/2014   Procedure: RIGHT ABOVE KNEE AMPUTATION ;  Surgeon: [name], MD;  Location: [facility];  Service: Orthopedics;  Laterality: Right;  . CARDIAC CATHETERIZATION  02/18/2013   normal coronaries  . ESOPHAGOGASTRODUODENOSCOPY N/A 11/02/2016   Procedure: ESOPHAGOGASTRODUODENOSCOPY (EGD);  Surgeon: [name], MD;  Location[facility] ENDOSCOPY;  Service: Endoscopy;  Laterality: N/A;  . I&D EXTREMITY Right 12/14/2013   Procedure: IRRIGATION AND DEBRIDEMENT RIGHT FOOT;  Surgeon: [name], MD;  Location: [facility];  Service: Orthopedics;  Laterality: Right;  . INCISION AND DRAINAGE ABSCESS  2007; 2015   "back"  . INCISION AND DRAINAGE OF WOUND Right 12/22/2013   Procedure: I&D RIGHT BUTTOCK;  Surgeon: [name], MD;  Location: [facility];  Service: Orthopedics;  Laterality: Right;  . LEFT HEART CATHETERIZATION WITH CORONARY ANGIOGRAM N/A 02/18/2013   Procedure: LEFT HEART CATHETERIZATION WITH CORONARY ANGIOGRAM;  Surgeon: [name], MD;  Location: [facility] C[facility] LAB;  Service: Cardiovascular;  Laterality: N/A;        Home Medications    Prior to Admission medications     Medication Sig Start Date End Date Taking? Authorizing Provider  ACCU-CHEK SOFTCLIX LANCETS lancets USE TO CHECK BLOOD SUGAR 5 TIMES DAILY. DX CODE E11.9. 10/25/17   Kalman Shan Ratliff, DO  allopurinol (ZYLOPRIM) 100 MG tablet TAKE 2 TABLETS BY MOUTH ONCE DAILY Patient taking differently: Take 200 mg by mouth daily.  09/03/17   Kalman Shan Ratliff, DO  Alpha-Lipoic Acid 200 MG CAPS Take 1 capsule (200 mg total) by mouth daily. 08/17/16   Kalman Shan Ratliff, DO  amLODipine (NORVASC) 5 MG tablet TAKE 1 TABLET BY MOUTH ONCE DAILY Patient taking differently: Take 5 mg by mouth daily.  09/12/17   [name], DO  apixaban (ELIQUIS) 5 MG TABS tablet Take 1 tablet (5 mg total) by mouth 2 (two) times daily. 08/11/16   Axel Filler, MD  calcitRIOL (ROCALTROL) 0.25 MCG capsule TAKE 1 CAPSULE BY MOUTH ONCE DAILY Patient taking differently: Take 0.25 mcg by mouth daily.  08/13/17   Aldine Contes, MD  carvedilol (COREG) 25 MG tablet TAKE 1 TABLET  BY MOUTH TWICE DAILY 02/27/18   Kalman Shan Ratliff, DO  cloNIDine (CATAPRES) 0.3 MG tablet Take 1 tablet (0.3 mg total) by mouth 2 (two) times daily. 08/14/17   Kalman Shan Ratliff, DO  Cyanocobalamin (B-12) 2500 MCG SUBL Place 2,500 mcg under the tongue daily.    [provider]  furosemide (LASIX) 40 MG tablet Take 80 mg by mouth 2 (two) times daily. 02/22/18   [provider]  gabapentin (NEURONTIN) 100 MG capsule Take 100 mg by mouth 3 (three) times daily.    [provider]  gabapentin (NEURONTIN) 100 MG capsule Take 1 capsule (100 mg total) by mouth 3 (three) times daily. 02/27/18   Kalman Shan Ratliff, DO  glucose blood (ACCU-CHEK AVIVA PLUS) test strip 1 each by Other route 3 (three) times daily. Use as instructed to check blood sugar 3 times daily. DX:E11.65 12/05/17   [name], MD  hydrALAZINE (APRESOLINE) 50 MG tablet Take 50 mg by mouth 4 (four) times daily.    [provider]  insulin aspart (NOVOLOG) 100 UNIT/ML injection Inject 15 Units into the skin 3 (three) times daily before meals. INJECT 15 UNITS UNDER THE SKIN THREE TIMES DAILY WITH MEALS.    [provider]  Insulin Degludec 200 UNIT/ML SOPN Inject 60 Units into the skin daily. 02/14/18   Mosetta Anis, MD  Insulin Pen Needle (B-D UF III MINI PEN NEEDLES) 31G X 5 MM MISC USE  4 TIMES DAILY 09/05/17   Kalman Shan Ratliff, DO  Insulin Pen Needle (PEN NEEDLES 3/16") 31G X 5 MM MISC Use five times daily 08/14/16   [name], DO  Lancet Devices [facility]) lancets Use to check blood sugars 5 times a day. Dx code:E11.9. Insulin dependent. 08/14/16   Kalman Shan Ratliff, DO  loratadine (CLARITIN) 10 MG tablet Take 10 mg by mouth daily as needed (seasonal allergies).     [provider]  lovastatin (MEVACOR) 20 MG tablet TAKE 1 TABLET BY MOUTH ONCE DAILY Patient taking differently: Take 20 mg by mouth daily at 6 PM.  09/05/17   Hoffman, Elza Rafter, DO  Omega-3 Fatty Acids (FISH OIL PO) Take 1 capsule by mouth at bedtime.    [provider]  oxybutynin (DITROPAN) 5 MG tablet Take 5 mg by mouth 3 (three) times daily.    [provider]  polyethylene glycol (MIRALAX / GLYCOLAX) packet Take 17 g by mouth daily as needed for mild constipation.     [provider]  promethazine (PHENERGAN) 12.5 MG tablet Take 1 tablet (12.5 mg total) by mouth every 8 (eight) hours as needed for nausea or vomiting. 06/10/17   Santos-Sanchez, Merlene Morse, MD  senna-docusate (SENOKOT-S) 8.6-50 MG tablet Take 2 tablets by mouth daily as needed for mild constipation. 08/14/17 08/14/18  Hoffman, Janett Billow Ratliff, DO  VICTOZA 18 MG/3ML SOPN INJECT 1.2MG  (0.2 MLS) SUBCUTANEOUSLY ONCE DAILY AT THE SAME TIME Patient taking differently: Inject 1.2 mg into the skin daily.  01/29/18   [name], MD  zinc sulfate 220 (50 Zn) MG capsule Take 220 mg by mouth daily.    [provider]    Family History Family History  Problem Relation Age of Onset  . Heart disease Mother   . Diabetes Mother   . Diabetes Maternal Uncle   . Diabetes Cousin     Social History Social History   Tobacco Use  . Smoking status: Never Smoker  . Smokeless tobacco: Never Used  Substance  Use Topics  . Alcohol use: Yes    Comment: 10/31/2016 "a few beers q 6 months or so"  . Drug use: No     Allergies   Reglan [metoclopramide]   Review of Systems Review of Systems  Gastrointestinal: Positive for abdominal distention and nausea.  Genitourinary: Positive for hematuria.  Musculoskeletal: Positive for arthralgias.  All other systems reviewed and are negative.    Physical Exam Updated Vital Signs BP 124/73 (BP Location: Right Arm)   Pulse 78   Temp 98.4 F (36.9 C) (Oral)   Resp 18   SpO2 98%   Physical Exam Vitals signs and nursing note reviewed.  Constitutional:      Appearance: He is well-developed.     Comments: Morbidly obese  HENT:     Head: Normocephalic and atraumatic.  Eyes:     Conjunctiva/sclera: Conjunctivae normal.     Pupils: Pupils are equal, round, and reactive to light.  Neck:     Musculoskeletal: Normal range of motion.  Cardiovascular:     Rate and Rhythm: Normal rate and regular rhythm.     Heart sounds: Normal heart sounds.  Pulmonary:     Effort: Pulmonary effort is normal.     Breath sounds: Normal breath sounds.  Abdominal:     General: Bowel sounds are normal.     Palpations: Abdomen is soft.     Tenderness: There is abdominal tenderness in the right upper quadrant and epigastric area. There is no right CVA tenderness or left CVA tenderness.     Comments: Abdomen is obese, tenderness in the epigastrium and right upper quadrant, negative Murphy sign No CVA tenderness  Musculoskeletal: Normal range of motion.  Skin:    General: Skin is warm and dry.  Neurological:     Mental Status: He is alert and oriented to person,  place, and time.      ED Treatments / Results  Labs (all labs ordered are listed, but only abnormal results are displayed) Labs Reviewed  URINALYSIS, ROUTINE W REFLEX MICROSCOPIC - Abnormal; Notable for the following components:      Result Value   APPearance CLOUDY (*)    Glucose, UA 50 (*)    Hgb urine dipstick LARGE (*)    Protein, ur 100 (*)    Leukocytes,Ua TRACE (*)    RBC / HPF >50 (*)    Bacteria, UA RARE (*)    All other components within normal limits  COMPREHENSIVE METABOLIC PANEL - Abnormal; Notable for the following components:   Glucose, Bld 164 (*)    BUN 58 (*)    Creatinine, Ser 2.77 (*)    Calcium 8.5 (*)    Albumin 3.0 (*)    GFR calc non Af Amer 27 (*)    GFR calc Af Amer 31 (*)    All other components within normal limits  CBC - Abnormal; Notable for the following components:   RBC 3.61 (*)    Hemoglobin 9.9 (*)    HCT 33.2 (*)    MCHC 29.8 (*)    All other components within normal limits  LIPASE, BLOOD    EKG None  Radiology US Abdomen Limited Ruq  Result Date: 03/02/2018 CLINICAL DATA:  43 year old male with right upper quadrant abdominal pain. EXAM: ULTRASOUND ABDOMEN LIMITED RIGHT UPPER QUADRANT COMPARISON:  CT of the abdomen pelvis dated 02/08/2018 FINDINGS: Gallbladder: There are multiple small stones within the gallbladder. No gallbladder wall thickening or pericholecystic fluid. Negative sonographic Murphy's sign. Common bile  duct: Diameter: 7 mm Liver: The liver is unremarkable. Portal vein is patent on color Doppler imaging with normal direction of blood flow towards the liver. IMPRESSION: Cholelithiasis without sonographic evidence of acute cholecystitis. Electronically Signed   By: Anner Crete M.D.   On: 03/02/2018 02:02    Procedures Procedures (including critical care time)  Medications Ordered in ED Medications  sodium chloride flush (NS) 0.9 % injection 3 mL (has no administration in time range)  oxyCODONE-acetaminophen  (PERCOCET/ROXICET) 5-325 MG per tablet 1 tablet (1 tablet Oral Given 03/02/18 0102)     Initial Impression / Assessment and Plan / ED Course  I have reviewed the triage vital signs and the nursing notes.  Pertinent labs & imaging results that were available during my care of the patient were reviewed by me and considered in my medical decision making (see chart for details).  43 year old male here with multiple complaints.  1.  Hematuria-- intermittent for several weeks, waxing and waning in severity.  Had cystoscopy with urology yesterday for same without acute findings.  No current dysuria or penile discharge.  No fevers.  Labs overall reassuring, normal white blood cell count.  UA with blood but rare bacteria, nitrite negative.  He is chronically anticoagulated with Eliquis which I discussed with him can precipitate hematuria.  Can follow-up with urology.  2.  Abdominal pain-- epigastric and RUQ.  Ongoing for about a week and half now.  Some nausea but no vomiting/diarrhea.  No fevers.  Decreased PO intake due to pain.  Labs reassuring--SrCr around his baseline with hx of CKD stage III.  History of gallstones so US obtained-- cholelithiasis without findings of cholecystitis.  Can follow-up with general surgery if any ongoing issues.  3.  Knot on leg-- present for 2 weeks, left lateral calf.  Does have small area of swelling to left lateral calf.  No warmth to touch, erythema, ulcer, wound, or rash noted.  Left lower leg does appears edematous but feel this is likely chronic.  No palpable cord noted, leg is NVI.  Has hx of DVT, reports compliance with eliquis. No falls or trauma to suggest acute fracture. Unable to obtain venous duplex at this house-- will have them return this morning for [place].  Recommended re-check with PCP for above complaints.  Return here for any new/acute changes.  Final Clinical Impressions(s) / ED Diagnoses   Final diagnoses:  RUQ pain  Pain in left lower leg   Hematuria, unspecified type    ED Discharge Orders         Ordered    [facility]     03/02/18 0244           Larene Pickett, PA-C 03/02/18 0310    Fatima Blank, MD 03/03/18 1257

## 2018-03-02 NOTE — ED Notes (Signed)
Pt upset he had to wait to be discharged due to a cpr staff working on    No time to get vitals  Pt ready to go home

## 2018-03-02 NOTE — Discharge Instructions (Signed)
As we discussed, labs and imaging studies today were overall reassuring. Hematuria may continue while taking the Eliquis.  If any ongoing issues with this I will follow-up with your urologist. Ultrasound still showed gallstones today but no findings of gallbladder wall inflammation or infection.  Can follow-up with general surgery if any ongoing issues. Return here in the morning for venous duplex like we discussed, go to the imaging department. Would recommend to follow-up with your primary care doctor. Return here for any new or acute changes.

## 2018-03-04 ENCOUNTER — Telehealth: Payer: Self-pay | Admitting: Internal Medicine

## 2018-03-04 ENCOUNTER — Telehealth: Payer: Self-pay | Admitting: *Deleted

## 2018-03-04 NOTE — Telephone Encounter (Signed)
Pt calls and wants to know why his gabapentin has been cut from 300mg  caps to 100mg  caps, he states he was not informed that this was being done. Explained it could be a misunderstanding. Will ask dr [place] for advisement

## 2018-03-04 NOTE — Telephone Encounter (Signed)
-----   Message from Sarina Ill, RN sent at 02/28/2018  4:49 PM EST ----- Regarding: Sleep Hello, Dr. Radford Pax ordered a CPAP titration in the lab. Orders placed, please send. Thanks, [employer]

## 2018-03-05 ENCOUNTER — Institutional Professional Consult (permissible substitution): Payer: Medicare Other | Admitting: Pulmonary Disease

## 2018-03-05 ENCOUNTER — Other Ambulatory Visit: Payer: Self-pay | Admitting: Internal Medicine

## 2018-03-05 DIAGNOSIS — H35031 Hypertensive retinopathy, right eye: Secondary | ICD-10-CM | POA: Diagnosis not present

## 2018-03-05 DIAGNOSIS — H3581 Retinal edema: Secondary | ICD-10-CM | POA: Diagnosis not present

## 2018-03-05 DIAGNOSIS — E113292 Type 2 diabetes mellitus with mild nonproliferative diabetic retinopathy without macular edema, left eye: Secondary | ICD-10-CM | POA: Diagnosis not present

## 2018-03-05 DIAGNOSIS — Q141 Congenital malformation of retina: Secondary | ICD-10-CM | POA: Diagnosis not present

## 2018-03-05 DIAGNOSIS — H35722 Serous detachment of retinal pigment epithelium, left eye: Secondary | ICD-10-CM | POA: Diagnosis not present

## 2018-03-05 DIAGNOSIS — E113211 Type 2 diabetes mellitus with mild nonproliferative diabetic retinopathy with macular edema, right eye: Secondary | ICD-10-CM | POA: Diagnosis not present

## 2018-03-05 MED ORDER — GABAPENTIN 100 MG PO CAPS: 100.0000 mg | ORAL_CAPSULE | Freq: Three times a day (TID) | ORAL | 1 refills | Status: DC

## 2018-03-05 MED ORDER — GABAPENTIN 300 MG PO CAPS: 300.0000 mg | ORAL_CAPSULE | Freq: Three times a day (TID) | ORAL | 3 refills | Status: DC

## 2018-03-05 NOTE — Telephone Encounter (Signed)
Changed prescription. Thanks!

## 2018-03-08 ENCOUNTER — Telehealth: Payer: Self-pay | Admitting: *Deleted

## 2018-03-08 ENCOUNTER — Institutional Professional Consult (permissible substitution): Payer: Medicare Other | Admitting: Pulmonary Disease

## 2018-03-08 NOTE — Telephone Encounter (Signed)
-----   Message from [name], [place] sent at 03/04/2018  1:26 PM EST ----- Regarding: precert  CPAP titration in the lab.

## 2018-03-08 NOTE — Telephone Encounter (Signed)
Staff message sent to [name] ok to schedule CPAP titration study. Insurance does not require PA.

## 2018-03-10 ENCOUNTER — Other Ambulatory Visit: Payer: Self-pay | Admitting: Internal Medicine

## 2018-03-12 ENCOUNTER — Encounter: Payer: Self-pay | Admitting: *Deleted

## 2018-03-12 DIAGNOSIS — N3000 Acute cystitis without hematuria: Secondary | ICD-10-CM | POA: Diagnosis not present

## 2018-03-12 DIAGNOSIS — N3944 Nocturnal enuresis: Secondary | ICD-10-CM | POA: Diagnosis not present

## 2018-03-12 NOTE — Telephone Encounter (Signed)
Patient is scheduled for CPAP Titration on 04/24/18. Patient understands his titration study will be done at [facility] sleep lab. Patient understands he will receive a letter in a week or so detailing appointment, date, time, and location. Patient understands to call if he does not receive the letter  in a timely manner. Left detailed message on voicemail with date and time of titration and informed patient to call back to confirm or reschedule.

## 2018-03-12 NOTE — Telephone Encounter (Signed)
  [name], CMA  [name], CMA        Ok to schedule CPAP titration. Patient's insurance does not require PA.     ----- Message -----  From: [name], CMA  Sent: 03/04/2018  1:26 PM EST  To: [facility] Sleep Studies  Subject: precert                      CPAP titration in the lab.

## 2018-03-14 ENCOUNTER — Other Ambulatory Visit: Payer: Self-pay

## 2018-03-14 ENCOUNTER — Encounter: Payer: Self-pay | Admitting: Internal Medicine

## 2018-03-14 ENCOUNTER — Ambulatory Visit (INDEPENDENT_AMBULATORY_CARE_PROVIDER_SITE_OTHER): Payer: Medicare Other | Admitting: Internal Medicine

## 2018-03-14 VITALS — BP 142/87 | HR 81 | Temp 98.8°F | Wt 392.7 lb

## 2018-03-14 DIAGNOSIS — D631 Anemia in chronic kidney disease: Secondary | ICD-10-CM | POA: Diagnosis not present

## 2018-03-14 DIAGNOSIS — M79605 Pain in left leg: Secondary | ICD-10-CM | POA: Diagnosis not present

## 2018-03-14 DIAGNOSIS — Z89611 Acquired absence of right leg above knee: Secondary | ICD-10-CM | POA: Diagnosis not present

## 2018-03-14 DIAGNOSIS — E1122 Type 2 diabetes mellitus with diabetic chronic kidney disease: Secondary | ICD-10-CM | POA: Diagnosis not present

## 2018-03-14 DIAGNOSIS — N2581 Secondary hyperparathyroidism of renal origin: Secondary | ICD-10-CM | POA: Diagnosis not present

## 2018-03-14 DIAGNOSIS — Z86718 Personal history of other venous thrombosis and embolism: Secondary | ICD-10-CM | POA: Diagnosis not present

## 2018-03-14 DIAGNOSIS — N183 Chronic kidney disease, stage 3 (moderate): Secondary | ICD-10-CM | POA: Diagnosis not present

## 2018-03-14 DIAGNOSIS — Z794 Long term (current) use of insulin: Secondary | ICD-10-CM | POA: Diagnosis not present

## 2018-03-14 DIAGNOSIS — N390 Urinary tract infection, site not specified: Secondary | ICD-10-CM | POA: Diagnosis not present

## 2018-03-14 DIAGNOSIS — I10 Essential (primary) hypertension: Secondary | ICD-10-CM | POA: Diagnosis not present

## 2018-03-14 DIAGNOSIS — I129 Hypertensive chronic kidney disease with stage 1 through stage 4 chronic kidney disease, or unspecified chronic kidney disease: Secondary | ICD-10-CM | POA: Diagnosis not present

## 2018-03-14 DIAGNOSIS — Z7901 Long term (current) use of anticoagulants: Secondary | ICD-10-CM

## 2018-03-14 DIAGNOSIS — N189 Chronic kidney disease, unspecified: Secondary | ICD-10-CM | POA: Diagnosis not present

## 2018-03-14 DIAGNOSIS — R3915 Urgency of urination: Secondary | ICD-10-CM | POA: Diagnosis not present

## 2018-03-14 DIAGNOSIS — G4730 Sleep apnea, unspecified: Secondary | ICD-10-CM | POA: Diagnosis not present

## 2018-03-14 DIAGNOSIS — Z79899 Other long term (current) drug therapy: Secondary | ICD-10-CM

## 2018-03-14 NOTE — Patient Instructions (Signed)
[name],  Is a pleasure seeing you today. An ultrasound of her leg has been ordered.  Please follow-up in 6 months or sooner as needed.

## 2018-03-14 NOTE — Progress Notes (Signed)
   CC: left leg pain  HPI:  [name] is a 43 y.o. male with history noted below that presents to the [facility] for follow-up on left leg pain. Please see problem based charting for the status of patient's chronic medical conditions.  Past Medical History:  Diagnosis Date  . Acute venous embolism and thrombosis of deep vessels of proximal lower extremity ([place]) 07/19/2011  . Anemia   . Chest pain, neg MI, normal coronaries by cath 02/18/2013  . CKD (chronic kidney disease) stage 3, GFR 30-59 ml/min ([facility]) 02/19/2013  . Colon polyps   . Diabetic ulcer of right foot ([place])   . DVT (deep venous thrombosis) ([place]) 09/2002   patient reports additional DVTs in '06 & '11 (unconfirmed)  . GERD (gastroesophageal reflux disease)   . History of blood transfusion    "related to OR" (10/31/2016)  . Hyperlipidemia 02/19/2013  . Hypertension   . Nausea & vomiting    "constant for the last couple weeks" (10/31/2016)  . Nephrotic syndrome   . Obesity    BMI 44, weight 346 pounds 01/30/14  . OSA (obstructive sleep apnea) 02/28/2018   Mild obstructive sleep apnea with an AHI of 9.8/h but severe during REM sleep with an AHI of 33.8/h.  Oxygen saturations dropped to 86% and there was moderate snoring  . Prosthesis adjustment 08/17/2016  . Pulmonary embolism ([place]) 09/2002   treated with 6 months of warfarin  . Pyelonephritis 02/02/2018  . Type I diabetes mellitus ([place]) dx'd 2001    Review of Systems:  Review of Systems  Constitutional: Negative for chills and fever.  Musculoskeletal: Negative for myalgias.  Neurological: Negative for focal weakness.     Physical Exam:  Vitals:   03/14/18 1526  BP: (!) 142/87  Pulse: 81  Temp: 98.8 F (37.1 C)  TempSrc: Oral  SpO2: 100%  Weight: (!) 392 lb 11.2 oz (178.1 kg)   Physical Exam  Constitutional: He is well-developed, well-nourished, and in no distress.  Cardiovascular: Normal rate, regular rhythm and normal heart sounds. Exam  reveals no gallop and no friction rub.  No murmur heard. Pulmonary/Chest: Effort normal and breath sounds normal. No respiratory distress. He has no wheezes. He has no rales.  Musculoskeletal:     Comments: Right AKA Left lower extremity with 2+ pitting edema No erythema Tender to palpation over the left lateral side in the middle of the leg Abrasion noted on left lateral side of lower extremity      Assessment & Plan:   See encounters tab for problem based medical decision making.   Patient discussed with Dr. Dareen Piano

## 2018-03-18 ENCOUNTER — Ambulatory Visit (HOSPITAL_COMMUNITY): Admission: RE | Admit: 2018-03-18 | Payer: Medicare Other | Source: Ambulatory Visit

## 2018-03-18 ENCOUNTER — Ambulatory Visit (HOSPITAL_COMMUNITY)
Admission: RE | Admit: 2018-03-18 | Discharge: 2018-03-18 | Disposition: A | Discharge status: Home / Self Care | Payer: Medicare Other | Source: Ambulatory Visit | Attending: Internal Medicine | Admitting: Internal Medicine

## 2018-03-18 DIAGNOSIS — M79605 Pain in left leg: Secondary | ICD-10-CM

## 2018-03-18 NOTE — Progress Notes (Signed)
Left lower extremity venous duplex completed. Preliminary results in Chart review CV proc. [facility], [place]  03/18/2018, 12:03 PM

## 2018-03-19 ENCOUNTER — Ambulatory Visit: Payer: Medicare Other | Admitting: Endocrinology

## 2018-03-20 DIAGNOSIS — M79605 Pain in left leg: Secondary | ICD-10-CM | POA: Insufficient documentation

## 2018-03-20 NOTE — Assessment & Plan Note (Signed)
HPI:  Patient has been experiencing left leg pain for the past 3 weeks.  He states he bumped his leg against something but did not think much about it and later developed pain that has been sharp and constant for the past 3 weeks. He states he went to the ED to be evaluated but could not stay for further imaging. He denies fever/chills. He states the pain has improved over the past 3 weeks but is still present.  Patient reports compliance with eliquis for history of DVT and states he has not missed a dose.  Assessment: Left leg pain Patient is on eliquis and DVT is less likely causing symptoms but will order Doppler due to history. No signs of infection at this time. May need time to heal from injury.  Plan -US DVT study -told patient to call if symptoms did not improve or worsened

## 2018-03-20 NOTE — Progress Notes (Signed)
Internal Medicine Clinic Attending  Case discussed with Dr. Hoffman at the time of the visit.  We reviewed the resident's history and exam and pertinent patient test results.  I agree with the assessment, diagnosis, and plan of care documented in the resident's note.  

## 2018-03-20 NOTE — Assessment & Plan Note (Signed)
HPI:  Patient reports adherence to amlodipine, carvedilol, clonidine, lasix, metolozone and hydralazine Assessment: Essential hypertension Blood pressure today is 142/87, stable.    Plan -continue current blood pressure regimen

## 2018-03-21 ENCOUNTER — Ambulatory Visit (INDEPENDENT_AMBULATORY_CARE_PROVIDER_SITE_OTHER): Payer: Medicare Other | Admitting: Endocrinology

## 2018-03-21 ENCOUNTER — Other Ambulatory Visit: Payer: Self-pay

## 2018-03-21 ENCOUNTER — Encounter: Payer: Self-pay | Admitting: Endocrinology

## 2018-03-21 ENCOUNTER — Telehealth: Payer: Self-pay

## 2018-03-21 ENCOUNTER — Telehealth: Payer: Self-pay | Admitting: Endocrinology

## 2018-03-21 VITALS — BP 134/72 | HR 84 | Ht 74.0 in | Wt 393.4 lb

## 2018-03-21 DIAGNOSIS — E1165 Type 2 diabetes mellitus with hyperglycemia: Secondary | ICD-10-CM | POA: Diagnosis not present

## 2018-03-21 DIAGNOSIS — Z794 Long term (current) use of insulin: Secondary | ICD-10-CM | POA: Diagnosis not present

## 2018-03-21 NOTE — Patient Instructions (Addendum)
May skip Novolog when exercising in am  More sugars after lunch and dinner   High sugar correction: 5 Novolog  for 200 and 10 if over 300  Check blood sugars on waking up days a week  Also check blood sugars about 2 hours after meals and do this after different meals by rotation  Recommended blood sugar levels on waking up are 90-130 and about 2 hours after meal is 130-160  Please bring your blood sugar monitor to each visit, thank you  [name] same unless waking up sugar out of range

## 2018-03-21 NOTE — Telephone Encounter (Signed)
Requesting test result. Please call pt back.

## 2018-03-21 NOTE — Progress Notes (Signed)
Patient ID: [name], male   DOB: 1975/10/17, 43 y.o.   MRN: [id number]          Reason for Appointment: Follow-up for Type 2 Diabetes   History of Present Illness:          Date of diagnosis of type 2 diabetes mellitus:  2001      His weight is about 400 pounds at the time of diagnosis He does not know what his initial blood sugars and circumstances were but he apparently was started on insulin initially He was not able to tolerate metformin because of diarrhea Subsequently has been on insulin only, level of control not available but his A1c was significantly high at 12.8 in 2015  Background history:    Recent history:   INSULIN regimen is:  60 units Tresiba, NovoLog 15 U tid     Non-insulin hypoglycemic drugs the patient is taking are: Victoza 1.2 mg daily  His A1c was last 5.6  Current management, blood sugar patterns and problems identified:  On his last visit he was told to stop his NovoLog because of his decreased appetite and relatively low blood sugar  However he is taking this again without regard to his blood sugars, exercise or food intake  This has caused occasional hypoglycemia again after his exercise with blood sugars as low as 43 but only a couple of documented episodes  He is still not checking his blood sugars consistently and mostly after he comes back from exercise at the gym  Also average blood sugar is affected by his low blood sugar history last month  He has a couple of fasting blood sugars which are not consistent  Highest blood sugar is 323 which he thinks was from eating more carbohydrates but difficult to explain  He took a correction dose of 15 units after this high sugar which caused hypoglycemia  No readings after supper  Also has only 1 reading of 216 late afternoon last month  No recent labs available       Side effects from medications have been: Diarrhea with metformin, vomiting with Ozempic  Compliance with the medical  regimen: Variable Hypoglycemia:   As above  Glucose monitoring:  done <1 a day         Glucometer:   ACCU-CHEK Aviva Blood sugar readings from download and averages for the last 30 days   PRE-MEAL Fasting Lunch Dinner Bedtime Overall  Glucose range:  121, 185  81-323  216, 52    Mean/median:      125   POST-MEAL PC Breakfast PC Lunch PC Dinner  Glucose range:  43-137    Mean/median:       PREVIOUS readings:  PRE-MEAL Fasting Lunch Dinner Bedtime Overall  Glucose range:  105-165      Mean/median:      110   POST-MEAL PC Breakfast PC Lunch PC Dinner  Glucose range:  37-146  102-131  83-205  Mean/median:         Self-care:    Typical meal intake: Breakfast is egg whites, some oatmeal/grits and banana.  Usually small portions of meat or other protein at lunch and dinner and only a small amount of sweet potato at lunch or carbohydrate              Dietician visit, most recent: Unknown               Exercise:  Weights, resistance and some cardio at the gym, recently none  Previously was doing 2 sessions of 1 hour each  Weight history:  Wt Readings from Last 3 Encounters:  03/21/18 (!) 393 lb 6.4 oz (178.4 kg)  03/14/18 (!) 392 lb 11.2 oz (178.1 kg)  02/28/18 (!) 387 lb (175.5 kg)    Glycemic control:   Lab Results  Component Value Date   HGBA1C 5.6 02/20/2018   HGBA1C 8.7 (H) 09/17/2017   HGBA1C 6.2 06/05/2017   Lab Results  Component Value Date   MICROALBUR 160.61 (H) 08/14/2013   LDLCALC 91 06/05/2017   CREATININE 2.77 (H) 03/01/2018   Lab Results  Component Value Date   MICRALBCREAT 818.2 (H) 08/14/2013    Lab Results  Component Value Date   FRUCTOSAMINE 263 12/04/2017      Allergies as of 03/21/2018      Reactions   Reglan [metoclopramide] Other (See Comments)   Dysphoric reaction      Medication List       Accurate as of March 21, 2018 11:08 AM. Always use your most recent med list.        accu-chek softclix lancets Use to check blood  sugars 5 times a day. Dx code:E11.9. Insulin dependent.   Accu-Chek Softclix Lancets lancets USE TO CHECK BLOOD SUGAR 5 TIMES DAILY. DX CODE E11.9.   allopurinol 100 MG tablet Commonly known as:  ZYLOPRIM TAKE 2 TABLETS BY MOUTH ONCE DAILY   Alpha-Lipoic Acid 200 MG Caps Take 1 capsule (200 mg total) by mouth daily.   apixaban 5 MG Tabs tablet Commonly known as:  Eliquis Take 1 tablet (5 mg total) by mouth 2 (two) times daily.   B-12 2500 MCG Subl Place 2,500 mcg under the tongue daily.   calcitRIOL 0.25 MCG capsule Commonly known as:  ROCALTROL Take 1 capsule by mouth once daily   carvedilol 25 MG tablet Commonly known as:  COREG TAKE 1 TABLET BY MOUTH TWICE DAILY   cloNIDine 0.3 MG tablet Commonly known as:  CATAPRES Take 1 tablet (0.3 mg total) by mouth 2 (two) times daily.   FISH OIL PO Take 1 capsule by mouth at bedtime.   furosemide 40 MG tablet Commonly known as:  LASIX Take 80 mg by mouth 2 (two) times daily.   gabapentin 100 MG capsule Commonly known as:  NEURONTIN Take 100 mg by mouth 3 (three) times daily. Take 1 tablet by mouth three times daily.   glucose blood test strip Commonly known as:  Accu-Chek Aviva Plus 1 each by Other route 3 (three) times daily. Use as instructed to check blood sugar 3 times daily. DX:E11.65   hydrALAZINE 50 MG tablet Commonly known as:  APRESOLINE Take 50 mg by mouth 4 (four) times daily.   insulin aspart 100 UNIT/ML injection Commonly known as:  novoLOG Inject 15 Units into the skin 3 (three) times daily before meals. INJECT 15 UNITS UNDER THE SKIN THREE TIMES DAILY WITH MEALS.   Insulin Degludec 200 UNIT/ML Sopn Inject 60 Units into the skin daily.   lovastatin 20 MG tablet Commonly known as:  MEVACOR TAKE 1 TABLET BY MOUTH ONCE DAILY   oxybutynin 5 MG tablet Commonly known as:  DITROPAN Take 5 mg by mouth 3 (three) times daily.   Pen Needles 3/16" 31G X 5 MM Misc Use five times daily   Insulin Pen Needle  31G X 5 MM Misc Commonly known as:  B-D UF III MINI PEN NEEDLES USE  4 TIMES DAILY   promethazine 12.5 MG tablet Commonly known as:  PHENERGAN Take 1 tablet (12.5 mg total) by mouth every 8 (eight) hours as needed for nausea or vomiting.   tamsulosin 0.4 MG Caps capsule Commonly known as:  FLOMAX Take 0.4 mg by mouth. Take 1 tablet by mouth once daily.   zinc sulfate 220 (50 Zn) MG capsule Take 220 mg by mouth daily.       Allergies:  Allergies  Allergen Reactions  . Reglan [Metoclopramide] Other (See Comments)    Dysphoric reaction    Past Medical History:  Diagnosis Date  . Acute venous embolism and thrombosis of deep vessels of proximal lower extremity ([place]) 07/19/2011  . Anemia   . Chest pain, neg MI, normal coronaries by cath 02/18/2013  . CKD (chronic kidney disease) stage 3, GFR 30-59 ml/min (HCC) 02/19/2013  . Colon polyps   . Diabetic ulcer of right foot ([place])   . DVT (deep venous thrombosis) ([place]) 09/2002   patient reports additional DVTs in '06 & '11 (unconfirmed)  . GERD (gastroesophageal reflux disease)   . History of blood transfusion    "related to OR" (10/31/2016)  . Hyperlipidemia 02/19/2013  . Hypertension   . Nausea & vomiting    "constant for the last couple weeks" (10/31/2016)  . Nephrotic syndrome   . Obesity    BMI 44, weight 346 pounds 01/30/14  . OSA (obstructive sleep apnea) 02/28/2018   Mild obstructive sleep apnea with an AHI of 9.8/h but severe during REM sleep with an AHI of 33.8/h.  Oxygen saturations dropped to 86% and there was moderate snoring  . Prosthesis adjustment 08/17/2016  . Pulmonary embolism ([place]) 09/2002   treated with 6 months of warfarin  . Pyelonephritis 02/02/2018  . Type I diabetes mellitus ([place]) dx'd 2001    Past Surgical History:  Procedure Laterality Date  . AMPUTATION Right 12/22/2013   Procedure: AMPUTATION BELOW KNEE;  Surgeon: [name], MD;  Location: [place];  Service: Orthopedics;  Laterality: Right;  .  AMPUTATION Right 02/19/2014   Procedure: RIGHT ABOVE KNEE AMPUTATION ;  Surgeon: [name], MD;  Location: [facility];  Service: Orthopedics;  Laterality: Right;  . CARDIAC CATHETERIZATION  02/18/2013   normal coronaries  . ESOPHAGOGASTRODUODENOSCOPY N/A 11/02/2016   Procedure: ESOPHAGOGASTRODUODENOSCOPY (EGD);  Surgeon: [name], MD;  Location: [facility] ENDOSCOPY;  Service: Endoscopy;  Laterality: N/A;  . I&D EXTREMITY Right 12/14/2013   Procedure: IRRIGATION AND DEBRIDEMENT RIGHT FOOT;  Surgeon: [name], MD;  Location: [place];  Service: Orthopedics;  Laterality: Right;  . INCISION AND DRAINAGE ABSCESS  2007; 2015   "back"  . INCISION AND DRAINAGE OF WOUND Right 12/22/2013   Procedure: I&D RIGHT BUTTOCK;  Surgeon: [name], MD;  Location: [place] [facility];  Service: Orthopedics;  Laterality: Right;  . LEFT HEART CATHETERIZATION WITH CORONARY ANGIOGRAM N/A 02/18/2013   Procedure: LEFT HEART CATHETERIZATION WITH CORONARY ANGIOGRAM;  Surgeon: [name], MD;  Location: [facility] CATH LAB;  Service: Cardiovascular;  Laterality: N/A;    Family History  Problem Relation Age of Onset  . Heart disease Mother   . Diabetes Mother   . Diabetes Maternal Uncle   . Diabetes Cousin     Social History:  reports that he has never smoked. He has never used smokeless tobacco. He reports current alcohol use. He reports that he does not use drugs.   Review of Systems    Lipid history: On Mevacor with good control as follows:    Lab Results  Component Value Date  CHOL 150 06/05/2017   HDL 33.30 (L) 06/05/2017   LDLCALC 91 06/05/2017   TRIG 127.0 06/05/2017   CHOLHDL 4 06/05/2017           Hypertension: He is taking clonidine, Norvasc and Coreg followed by nephrologist  BP Readings from Last 3 Encounters:  03/21/18 134/72  03/14/18 (!) 142/87  03/02/18 (!) 108/54     Most recent eye exam was in 08/2017  Most recent foot exam: 1/82   Complications of diabetes: Nephropathy with renal  insufficiency and proteinuria, unknown status of retinopathy, erectile dysfunction  Followed by nephrologist for CKD also  Lab Results  Component Value Date   CREATININE 2.77 (H) 03/01/2018   CREATININE 1.96 (H) 02/20/2018   CREATININE 2.62 (H) 02/13/2018     LABS:  No visits with results within 1 Week(s) from this visit.  Latest known visit with results is:  Admission on 03/01/2018, Discharged on 03/02/2018  Component Date Value Ref Range Status  . Color, Urine 03/01/2018 YELLOW  YELLOW Final  . APPearance 03/01/2018 CLOUDY* CLEAR Final  . Specific Gravity, Urine 03/01/2018 1.009  1.005 - 1.030 Final  . pH 03/01/2018 5.0  5.0 - 8.0 Final  . Glucose, UA 03/01/2018 50* NEGATIVE mg/dL Final  . Hgb urine dipstick 03/01/2018 LARGE* NEGATIVE Final  . Bilirubin Urine 03/01/2018 NEGATIVE  NEGATIVE Final  . Ketones, ur 03/01/2018 NEGATIVE  NEGATIVE mg/dL Final  . Protein, ur 03/01/2018 100* NEGATIVE mg/dL Final  . Nitrite 03/01/2018 NEGATIVE  NEGATIVE Final  . Leukocytes,Ua 03/01/2018 TRACE* NEGATIVE Final  . RBC / HPF 03/01/2018 >50* 0 - 5 RBC/hpf Final  . WBC, UA 03/01/2018 6-10  0 - 5 WBC/hpf Final  . Bacteria, UA 03/01/2018 RARE* NONE SEEN Final  . Hyaline Casts, UA 03/01/2018 PRESENT   Final   Performed at [facility], [place] [address], [place], [place] [address]  . Sodium 03/01/2018 135  135 - 145 mmol/L Final  . Potassium 03/01/2018 3.9  3.5 - 5.1 mmol/L Final  . Chloride 03/01/2018 106  98 - 111 mmol/L Final  . CO2 03/01/2018 23  22 - 32 mmol/L Final  . Glucose, Bld 03/01/2018 164* 70 - 99 mg/dL Final  . BUN 03/01/2018 58* 6 - 20 mg/dL Final  . Creatinine, Ser 03/01/2018 2.77* 0.61 - 1.24 mg/dL Final  . Calcium 03/01/2018 8.5* 8.9 - 10.3 mg/dL Final  . Total Protein 03/01/2018 6.5  6.5 - 8.1 g/dL Final  . Albumin 03/01/2018 3.0* 3.5 - 5.0 g/dL Final  . AST 03/01/2018 18  15 - 41 U/L Final  . ALT 03/01/2018 19  0 - 44 U/L Final  . Alkaline Phosphatase 03/01/2018 100   38 - 126 U/L Final  . Total Bilirubin 03/01/2018 0.5  0.3 - 1.2 mg/dL Final  . GFR calc non Af Amer 03/01/2018 27* >60 mL/min Final  . GFR calc Af Amer 03/01/2018 31* >60 mL/min Final  . Anion gap 03/01/2018 6  5 - 15 Final   Performed at [facility], [place] [address], [place],  [address]  . WBC 03/01/2018 8.0  4.0 - 10.5 K/uL Final  . RBC 03/01/2018 3.61* 4.22 - 5.81 MIL/uL Final  . Hemoglobin 03/01/2018 9.9* 13.0 - 17.0 g/dL Final  . HCT 03/01/2018 33.2* 39.0 - 52.0 % Final  . MCV 03/01/2018 92.0  80.0 - 100.0 fL Final  . MCH 03/01/2018 27.4  26.0 - 34.0 pg Final  . MCHC 03/01/2018 29.8* 30.0 - 36.0 g/dL  Final  . RDW 03/01/2018 14.3  11.5 - 15.5 % Final  . Platelets 03/01/2018 242  150 - 400 K/uL Final  . nRBC 03/01/2018 0.0  0.0 - 0.2 % Final   Performed at [facility], [place] [address] [place], [place] [address]  . Lipase 03/01/2018 38  11 - 51 U/L Final   Performed at [facility], [place] [address], [place], [place] [address]    Physical Examination:  BP 134/72 (BP Location: Left Arm, Patient Position: Sitting, Cuff Size: Normal)   Pulse 84   Ht 6\' 2"  (1.88 m)   Wt (!) 393 lb 6.4 oz (178.4 kg)   SpO2 98%   BMI 50.51 kg/m       ASSESSMENT:  Diabetes type 2, with morbid obesity  See history of present illness for detailed discussion of current diabetes management, blood sugar patterns and problems identified  His last A1c was 5.6  Although his appetite is not back to normal he is still eating fairly well and not requiring as much insulin as he did in the past This is without taking Victoza He still has tendency to low sugars periodically after exercise in the mornings especially if he does not eat enough breakfast, longer exercise and continuing to take NovoLog in the morning Have discussed previously that he likely does not need NovoLog at breakfast time Also need to adjust his [place] by the morning sugar readings which he does not do on waking  up  Also needs consistently more readings after lunch and supper to help adjust his insulin    PLAN:    He will stop NovoLog at breakfast and continue to monitor blood sugars after meals  He may not need to increase carbohydrate intake before exercise without NovoLog  He can adjust his NovoLog further if he has high readings consistently after lunch  Also he will let us know if he has high readings fasting but no change in [place] needed now  We will check fructosamine on the next visit     There are no Patient Instructions on file for this visit.     [name] 03/21/2018, 11:08 AM   Note: This office note was prepared with Dragon voice recognition system technology. Any transcriptional errors that result from this process are unintentional.

## 2018-03-21 NOTE — Telephone Encounter (Signed)
FYI

## 2018-03-21 NOTE — Telephone Encounter (Signed)
Patient wanted to make Dr Dwyane Dee aware of the shot he is having done on the 18th.  He could not remember the name of this during his visit, Shirlean Kelly )

## 2018-03-21 NOTE — Telephone Encounter (Signed)
Call from pt - stated the bruise on his left calf is darker in color and leg is swollen. Pt is on Eliquis. Doppler was done on Monday. Thanks

## 2018-03-22 ENCOUNTER — Other Ambulatory Visit: Payer: Self-pay | Admitting: Internal Medicine

## 2018-03-22 MED ORDER — CEPHALEXIN 500 MG PO CAPS: 500.0000 mg | ORAL_CAPSULE | Freq: Two times a day (BID) | ORAL | 0 refills | Status: AC

## 2018-03-22 NOTE — Telephone Encounter (Signed)
Please let patient know that we can try a course of antibiotics to see if this will help symptoms.  Prescription sent to the pharmacy.  He will need to let me know if this helps or not.  If symptoms become worse then he will need to be evaluated in the ED.

## 2018-03-22 NOTE — Telephone Encounter (Signed)
Pt called / informed " we can try a course of antibiotics to see if this will help symptoms.  Prescription sent to the pharmacy.  He will need to let me know if this helps or not.  If symptoms become worse then he will need to be evaluated in the ED." per Dr Magdalene River. Verbalized understanding.

## 2018-03-22 NOTE — Telephone Encounter (Signed)
Pt is calling regarding results and the bruise on his leg is getting darker,pls return his call [phone]

## 2018-03-22 NOTE — Telephone Encounter (Signed)
Talked to Dr Heber Kenwood - stated doppler was negative and will decide on how to proceed about  the bruise. Called pt - informed of doppler result. Stated the bruise is getting larger and looks worse.

## 2018-03-26 ENCOUNTER — Other Ambulatory Visit (HOSPITAL_COMMUNITY): Payer: Self-pay | Admitting: *Deleted

## 2018-03-26 ENCOUNTER — Other Ambulatory Visit: Payer: Self-pay

## 2018-03-27 ENCOUNTER — Other Ambulatory Visit: Payer: Self-pay

## 2018-03-27 ENCOUNTER — Ambulatory Visit (HOSPITAL_COMMUNITY)
Admission: RE | Admit: 2018-03-27 | Discharge: 2018-03-27 | Disposition: A | Discharge status: Home / Self Care | Payer: Medicare Other | Source: Ambulatory Visit | Attending: Nephrology | Admitting: Nephrology

## 2018-03-27 DIAGNOSIS — N189 Chronic kidney disease, unspecified: Secondary | ICD-10-CM | POA: Insufficient documentation

## 2018-03-27 DIAGNOSIS — D631 Anemia in chronic kidney disease: Secondary | ICD-10-CM | POA: Diagnosis not present

## 2018-03-27 DIAGNOSIS — E1165 Type 2 diabetes mellitus with hyperglycemia: Secondary | ICD-10-CM | POA: Insufficient documentation

## 2018-03-27 LAB — GLUCOSE, CAPILLARY: GLUCOSE-CAPILLARY: 283 mg/dL — AB (ref 70–99)

## 2018-03-27 MED ORDER — SODIUM CHLORIDE 0.9 % IV SOLN
510.0000 mg | Freq: Once | INTRAVENOUS | Status: AC
  Administered 2018-03-27: 510 mg via INTRAVENOUS
  Filled 2018-03-27: qty 510

## 2018-03-27 NOTE — Discharge Instructions (Signed)

## 2018-04-02 ENCOUNTER — Encounter: Payer: Self-pay | Admitting: Internal Medicine

## 2018-04-02 ENCOUNTER — Ambulatory Visit (INDEPENDENT_AMBULATORY_CARE_PROVIDER_SITE_OTHER): Payer: Medicare Other | Admitting: Internal Medicine

## 2018-04-02 ENCOUNTER — Other Ambulatory Visit: Payer: Self-pay

## 2018-04-02 VITALS — BP 130/63 | HR 79 | Temp 98.1°F | Ht 74.0 in | Wt 392.6 lb

## 2018-04-02 DIAGNOSIS — Z7901 Long term (current) use of anticoagulants: Secondary | ICD-10-CM | POA: Diagnosis not present

## 2018-04-02 DIAGNOSIS — Z872 Personal history of diseases of the skin and subcutaneous tissue: Secondary | ICD-10-CM

## 2018-04-02 DIAGNOSIS — Z86711 Personal history of pulmonary embolism: Secondary | ICD-10-CM | POA: Diagnosis not present

## 2018-04-02 DIAGNOSIS — M79605 Pain in left leg: Secondary | ICD-10-CM | POA: Diagnosis not present

## 2018-04-02 DIAGNOSIS — Z86718 Personal history of other venous thrombosis and embolism: Secondary | ICD-10-CM

## 2018-04-02 MED ORDER — OXYCODONE-ACETAMINOPHEN 7.5-325 MG PO TABS: 1.0000 | ORAL_TABLET | Freq: Four times a day (QID) | ORAL | 0 refills | Status: AC | PRN

## 2018-04-02 MED ORDER — CEPHALEXIN 500 MG PO CAPS: 500.0000 mg | ORAL_CAPSULE | Freq: Two times a day (BID) | ORAL | 0 refills | Status: DC

## 2018-04-02 NOTE — Progress Notes (Signed)
Internal Medicine Clinic Attending  I saw and evaluated the patient.  I personally confirmed the key portions of the history and exam documented by Dr. Eileen Stanford and I reviewed pertinent patient test results.  The assessment, diagnosis, and plan were formulated together and I agree with the documentation in the resident's note.   Agree with POCUS intrepretation, overall no descrete fluid collection, still with clinical and u/s evidence of swelling that improved with Abx, will continue longer course of ABx, continue leg elevation, patient will contact us via telephone in next 1-2 weeks to update.

## 2018-04-02 NOTE — Patient Instructions (Signed)
[name],   It was a pleasure taking care of you here today.  As we discussed, the ultrasound we did did not show any fluid pocket which would indicate an abscess or cyst.  Here my recommendations after today's visit.  1.  I am prescribing you a 10-day course of Keflex 500 mg twice daily.  If the induration on the left lateral aspect of your leg gets better please he can stop the antibiotics. 2.  I am also prescribing you a 5-day course of Percocet to help you with the pain.  Take Care

## 2018-04-02 NOTE — Assessment & Plan Note (Signed)
Left lower extremity pain: [name] is a 43 year old gentleman with history of DVT/PE on chronic anticoagulation with Eliquis, also with history of previous cyst (of the neck and back) that required incision and drainage in 2011 and 2015.  He reports that about a month ago he accidentally hit his left lower extremity on an unknown object and since then developed what seems to be a cystlike lesion/skin bruise and pain on the left lower extremity lateral portion.  He was evaluated by Dr. Heber Dodge on March 14, 2018 with similar symptoms and was prescribed Keflex 500 mg twice daily x5 days as well as undergoing ultrasound lower extremity to rule out DVT was unremarkable.  He reports some improvement to his pain and induration after his antibiotic dose however the cystlike lesion has still persisted.  He denies fevers, chills, or cardiopulmonary process.      Point-of-care ultrasound today of the left lateral aspect of the lower extremity revealed subcutaneous edema with cobblestoning but no obvious discrete fluid pocket which would indicate an abscess or cyst.  Skin was not warm to touch.  Plan: - Start 10-day course of Keflex 500 mg twice daily.  Advised patient to discontinue antibiotics if significant improvement - 5-day course of Percocet #20 - Trial of heat compressions and elevation of left lower extremity.

## 2018-04-02 NOTE — Progress Notes (Signed)
   CC: "Cyst on Left lower extremity"  HPI:  [name] is a 43 y.o. with medical history listed below presenting for evaluation of left lower extremity "cyst."  Past Medical History:  Diagnosis Date  . Acute venous embolism and thrombosis of deep vessels of proximal lower extremity ([place]) 07/19/2011  . Anemia   . Chest pain, neg MI, normal coronaries by cath 02/18/2013  . CKD (chronic kidney disease) stage 3, GFR 30-59 ml/min (HCC) 02/19/2013  . Colon polyps   . Diabetic ulcer of right foot ([place])   . DVT (deep venous thrombosis) ([place]) 09/2002   patient reports additional DVTs in '06 & '11 (unconfirmed)  . GERD (gastroesophageal reflux disease)   . History of blood transfusion    "related to OR" (10/31/2016)  . Hyperlipidemia 02/19/2013  . Hypertension   . Nausea & vomiting    "constant for the last couple weeks" (10/31/2016)  . Nephrotic syndrome   . Obesity    BMI 44, weight 346 pounds 01/30/14  . OSA (obstructive sleep apnea) 02/28/2018   Mild obstructive sleep apnea with an AHI of 9.8/h but severe during REM sleep with an AHI of 33.8/h.  Oxygen saturations dropped to 86% and there was moderate snoring  . Prosthesis adjustment 08/17/2016  . Pulmonary embolism ([place]) 09/2002   treated with 6 months of warfarin  . Pyelonephritis 02/02/2018  . Type I diabetes mellitus ([place]) dx'd 2001   Review of system as per HPI.  Physical Exam:  Vitals:   04/02/18 0924  BP: 130/63  Pulse: 79  Temp: 98.1 F (36.7 C)  TempSrc: Oral  SpO2: 97%  Weight: (!) 392 lb 9.6 oz (178.1 kg)  Height: 6\' 2"  (1.88 m)   Physical Exam Vitals signs and nursing note reviewed.  Constitutional:      General: He is not in acute distress.    Appearance: He is obese. He is not toxic-appearing or diaphoretic.  HENT:     Head: Normocephalic and atraumatic.  Neck:     Musculoskeletal: Neck supple.  Cardiovascular:     Rate and Rhythm: Normal rate.     Pulses: Normal pulses.     Heart sounds: Normal heart  sounds.  Pulmonary:     Effort: Pulmonary effort is normal.     Breath sounds: Normal breath sounds. No wheezing or rhonchi.  Musculoskeletal:        General: Tenderness (left lower extremity lateral aspect ) present.  Skin:    Findings: Bruising and lesion present. No erythema or rash.  Neurological:     Mental Status: He is alert.       Assessment & Plan:   See Encounters Tab for problem based charting.  Patient seen with Dr. Angelia Mould

## 2018-04-10 ENCOUNTER — Ambulatory Visit: Payer: Medicare Other | Admitting: Endocrinology

## 2018-04-11 ENCOUNTER — Telehealth: Payer: Self-pay | Admitting: *Deleted

## 2018-04-11 DIAGNOSIS — N3000 Acute cystitis without hematuria: Secondary | ICD-10-CM | POA: Diagnosis not present

## 2018-04-11 DIAGNOSIS — N3944 Nocturnal enuresis: Secondary | ICD-10-CM | POA: Diagnosis not present

## 2018-04-11 NOTE — Telephone Encounter (Signed)
Pt screened for COVID19 prior to scheduled [facility] appointment.   NURSING TRIAGE NOTE FOR RESPIRATORY SYMPTOMS Do you have a fever? No  Do you have a cough? No Do you have shortness of breath more than normal? No Do you have chest pain?No Are you able to eat and drink normally? Yes Have you seen a physician for these symptoms? N/A   Action Pt scheduled to be seen in [facility] to tomorrow at 2:15pm .Despina Hidden Cassady4/2/202011:22 AM

## 2018-04-12 ENCOUNTER — Inpatient Hospital Stay (HOSPITAL_COMMUNITY)
Admission: EM | Admit: 2018-04-12 | Discharge: 2018-04-16 | DRG: 638 | Disposition: A | Discharge status: Home / Self Care | Payer: Medicare Other | Attending: Internal Medicine | Admitting: Internal Medicine

## 2018-04-12 ENCOUNTER — Ambulatory Visit (INDEPENDENT_AMBULATORY_CARE_PROVIDER_SITE_OTHER): Payer: Medicare Other | Admitting: Internal Medicine

## 2018-04-12 ENCOUNTER — Encounter: Payer: Self-pay | Admitting: Internal Medicine

## 2018-04-12 ENCOUNTER — Emergency Department (HOSPITAL_COMMUNITY): Payer: Medicare Other

## 2018-04-12 ENCOUNTER — Other Ambulatory Visit: Payer: Self-pay

## 2018-04-12 VITALS — BP 125/57 | HR 85 | Temp 98.6°F

## 2018-04-12 DIAGNOSIS — Z888 Allergy status to other drugs, medicaments and biological substances status: Secondary | ICD-10-CM

## 2018-04-12 DIAGNOSIS — N400 Enlarged prostate without lower urinary tract symptoms: Secondary | ICD-10-CM | POA: Diagnosis present

## 2018-04-12 DIAGNOSIS — N183 Chronic kidney disease, stage 3 unspecified: Secondary | ICD-10-CM | POA: Diagnosis present

## 2018-04-12 DIAGNOSIS — Z794 Long term (current) use of insulin: Secondary | ICD-10-CM

## 2018-04-12 DIAGNOSIS — N179 Acute kidney failure, unspecified: Secondary | ICD-10-CM | POA: Diagnosis present

## 2018-04-12 DIAGNOSIS — Z89611 Acquired absence of right leg above knee: Secondary | ICD-10-CM

## 2018-04-12 DIAGNOSIS — Z89511 Acquired absence of right leg below knee: Secondary | ICD-10-CM

## 2018-04-12 DIAGNOSIS — M109 Gout, unspecified: Secondary | ICD-10-CM | POA: Diagnosis present

## 2018-04-12 DIAGNOSIS — Z6841 Body Mass Index (BMI) 40.0 and over, adult: Secondary | ICD-10-CM

## 2018-04-12 DIAGNOSIS — G4733 Obstructive sleep apnea (adult) (pediatric): Secondary | ICD-10-CM | POA: Diagnosis present

## 2018-04-12 DIAGNOSIS — N3281 Overactive bladder: Secondary | ICD-10-CM | POA: Diagnosis present

## 2018-04-12 DIAGNOSIS — Z79899 Other long term (current) drug therapy: Secondary | ICD-10-CM

## 2018-04-12 DIAGNOSIS — M7989 Other specified soft tissue disorders: Secondary | ICD-10-CM

## 2018-04-12 DIAGNOSIS — E785 Hyperlipidemia, unspecified: Secondary | ICD-10-CM | POA: Diagnosis present

## 2018-04-12 DIAGNOSIS — D631 Anemia in chronic kidney disease: Secondary | ICD-10-CM | POA: Diagnosis present

## 2018-04-12 DIAGNOSIS — Z87441 Personal history of nephrotic syndrome: Secondary | ICD-10-CM

## 2018-04-12 DIAGNOSIS — M79605 Pain in left leg: Secondary | ICD-10-CM | POA: Diagnosis not present

## 2018-04-12 DIAGNOSIS — Z833 Family history of diabetes mellitus: Secondary | ICD-10-CM

## 2018-04-12 DIAGNOSIS — I130 Hypertensive heart and chronic kidney disease with heart failure and stage 1 through stage 4 chronic kidney disease, or unspecified chronic kidney disease: Secondary | ICD-10-CM | POA: Diagnosis not present

## 2018-04-12 DIAGNOSIS — N184 Chronic kidney disease, stage 4 (severe): Secondary | ICD-10-CM

## 2018-04-12 DIAGNOSIS — Z8719 Personal history of other diseases of the digestive system: Secondary | ICD-10-CM

## 2018-04-12 DIAGNOSIS — S78111A Complete traumatic amputation at level between right hip and knee, initial encounter: Secondary | ICD-10-CM | POA: Diagnosis present

## 2018-04-12 DIAGNOSIS — E1022 Type 1 diabetes mellitus with diabetic chronic kidney disease: Secondary | ICD-10-CM | POA: Diagnosis present

## 2018-04-12 DIAGNOSIS — M79662 Pain in left lower leg: Secondary | ICD-10-CM | POA: Diagnosis not present

## 2018-04-12 DIAGNOSIS — Z7982 Long term (current) use of aspirin: Secondary | ICD-10-CM

## 2018-04-12 DIAGNOSIS — E11628 Type 2 diabetes mellitus with other skin complications: Secondary | ICD-10-CM | POA: Diagnosis not present

## 2018-04-12 DIAGNOSIS — Z86718 Personal history of other venous thrombosis and embolism: Secondary | ICD-10-CM

## 2018-04-12 DIAGNOSIS — L0390 Cellulitis, unspecified: Secondary | ICD-10-CM | POA: Diagnosis present

## 2018-04-12 DIAGNOSIS — E1122 Type 2 diabetes mellitus with diabetic chronic kidney disease: Secondary | ICD-10-CM | POA: Diagnosis not present

## 2018-04-12 DIAGNOSIS — R600 Localized edema: Secondary | ICD-10-CM | POA: Diagnosis not present

## 2018-04-12 DIAGNOSIS — Z8249 Family history of ischemic heart disease and other diseases of the circulatory system: Secondary | ICD-10-CM

## 2018-04-12 DIAGNOSIS — R52 Pain, unspecified: Secondary | ICD-10-CM

## 2018-04-12 DIAGNOSIS — L03116 Cellulitis of left lower limb: Secondary | ICD-10-CM | POA: Diagnosis present

## 2018-04-12 DIAGNOSIS — I5032 Chronic diastolic (congestive) heart failure: Secondary | ICD-10-CM | POA: Diagnosis not present

## 2018-04-12 DIAGNOSIS — Z87440 Personal history of urinary (tract) infections: Secondary | ICD-10-CM | POA: Diagnosis not present

## 2018-04-12 DIAGNOSIS — I2510 Atherosclerotic heart disease of native coronary artery without angina pectoris: Secondary | ICD-10-CM | POA: Diagnosis present

## 2018-04-12 DIAGNOSIS — Z86711 Personal history of pulmonary embolism: Secondary | ICD-10-CM

## 2018-04-12 DIAGNOSIS — E119 Type 2 diabetes mellitus without complications: Secondary | ICD-10-CM

## 2018-04-12 DIAGNOSIS — I5033 Acute on chronic diastolic (congestive) heart failure: Secondary | ICD-10-CM | POA: Diagnosis present

## 2018-04-12 DIAGNOSIS — K219 Gastro-esophageal reflux disease without esophagitis: Secondary | ICD-10-CM | POA: Diagnosis present

## 2018-04-12 DIAGNOSIS — Z7901 Long term (current) use of anticoagulants: Secondary | ICD-10-CM

## 2018-04-12 LAB — CBC WITH DIFFERENTIAL/PLATELET
Abs Immature Granulocytes: 0.04 10*3/uL (ref 0.00–0.07)
Basophils Absolute: 0.1 10*3/uL (ref 0.0–0.1)
Basophils Relative: 1 %
Eosinophils Absolute: 0.2 10*3/uL (ref 0.0–0.5)
Eosinophils Relative: 2 %
HCT: 31 % — ABNORMAL LOW (ref 39.0–52.0)
Hemoglobin: 9.5 g/dL — ABNORMAL LOW (ref 13.0–17.0)
Immature Granulocytes: 0 %
Lymphocytes Relative: 13 %
Lymphs Abs: 1.6 10*3/uL (ref 0.7–4.0)
MCH: 27 pg (ref 26.0–34.0)
MCHC: 30.6 g/dL (ref 30.0–36.0)
MCV: 88.1 fL (ref 80.0–100.0)
Monocytes Absolute: 0.8 10*3/uL (ref 0.1–1.0)
Monocytes Relative: 6 %
Neutro Abs: 9.8 10*3/uL — ABNORMAL HIGH (ref 1.7–7.7)
Neutrophils Relative %: 78 %
Platelets: 369 10*3/uL (ref 150–400)
RBC: 3.52 MIL/uL — ABNORMAL LOW (ref 4.22–5.81)
RDW: 14.3 % (ref 11.5–15.5)
WBC: 12.5 10*3/uL — ABNORMAL HIGH (ref 4.0–10.5)
nRBC: 0 % (ref 0.0–0.2)

## 2018-04-12 LAB — COMPREHENSIVE METABOLIC PANEL
ALT: 17 U/L (ref 0–44)
AST: 21 U/L (ref 15–41)
Albumin: 3 g/dL — ABNORMAL LOW (ref 3.5–5.0)
Alkaline Phosphatase: 105 U/L (ref 38–126)
Anion gap: 10 (ref 5–15)
BUN: 83 mg/dL — ABNORMAL HIGH (ref 6–20)
CO2: 23 mmol/L (ref 22–32)
Calcium: 8.8 mg/dL — ABNORMAL LOW (ref 8.9–10.3)
Chloride: 100 mmol/L (ref 98–111)
Creatinine, Ser: 3.24 mg/dL — ABNORMAL HIGH (ref 0.61–1.24)
GFR calc Af Amer: 26 mL/min — ABNORMAL LOW (ref >60)
GFR calc non Af Amer: 22 mL/min — ABNORMAL LOW (ref >60)
Glucose, Bld: 241 mg/dL — ABNORMAL HIGH (ref 70–99)
Potassium: 4.6 mmol/L (ref 3.5–5.1)
Sodium: 133 mmol/L — ABNORMAL LOW (ref 135–145)
Total Bilirubin: 0.6 mg/dL (ref 0.3–1.2)
Total Protein: 7.2 g/dL (ref 6.5–8.1)

## 2018-04-12 LAB — GLUCOSE, CAPILLARY: Glucose-Capillary: 184 mg/dL — ABNORMAL HIGH (ref 70–99)

## 2018-04-12 MED ORDER — INSULIN GLARGINE 100 UNIT/ML ~~LOC~~ SOLN
45.0000 [IU] | Freq: Every day | SUBCUTANEOUS | Status: DC
  Administered 2018-04-13 – 2018-04-16 (×4): 45 [IU] via SUBCUTANEOUS
  Filled 2018-04-12 (×5): qty 0.45

## 2018-04-12 MED ORDER — ALLOPURINOL 100 MG PO TABS
200.0000 mg | ORAL_TABLET | Freq: Every day | ORAL | Status: DC
  Administered 2018-04-13 – 2018-04-16 (×4): 200 mg via ORAL
  Filled 2018-04-12 (×4): qty 2

## 2018-04-12 MED ORDER — CLONIDINE HCL 0.2 MG PO TABS
0.3000 mg | ORAL_TABLET | Freq: Two times a day (BID) | ORAL | Status: DC
  Administered 2018-04-12 – 2018-04-16 (×8): 0.3 mg via ORAL
  Filled 2018-04-12 (×9): qty 1

## 2018-04-12 MED ORDER — INSULIN ASPART 100 UNIT/ML ~~LOC~~ SOLN
0.0000 [IU] | Freq: Three times a day (TID) | SUBCUTANEOUS | Status: DC
  Administered 2018-04-13: 3 [IU] via SUBCUTANEOUS
  Administered 2018-04-13 (×2): 5 [IU] via SUBCUTANEOUS
  Administered 2018-04-14: 2 [IU] via SUBCUTANEOUS
  Administered 2018-04-14 (×2): 3 [IU] via SUBCUTANEOUS
  Administered 2018-04-15: 2 [IU] via SUBCUTANEOUS
  Administered 2018-04-15 (×2): 5 [IU] via SUBCUTANEOUS
  Administered 2018-04-16 (×2): 3 [IU] via SUBCUTANEOUS
  Administered 2018-04-16: 5 [IU] via SUBCUTANEOUS

## 2018-04-12 MED ORDER — VANCOMYCIN HCL 10 G IV SOLR
2000.0000 mg | Freq: Once | INTRAVENOUS | Status: AC
  Administered 2018-04-12: 2000 mg via INTRAVENOUS
  Filled 2018-04-12: qty 2000

## 2018-04-12 MED ORDER — VITAMIN B-12 1000 MCG PO TABS
2500.0000 ug | ORAL_TABLET | Freq: Every day | ORAL | Status: DC
  Administered 2018-04-13 – 2018-04-16 (×4): 2500 ug via ORAL
  Filled 2018-04-12 (×4): qty 3

## 2018-04-12 MED ORDER — TAMSULOSIN HCL 0.4 MG PO CAPS
0.4000 mg | ORAL_CAPSULE | Freq: Every day | ORAL | Status: DC
  Administered 2018-04-12 – 2018-04-15 (×4): 0.4 mg via ORAL
  Filled 2018-04-12 (×4): qty 1

## 2018-04-12 MED ORDER — GABAPENTIN 100 MG PO CAPS
100.0000 mg | ORAL_CAPSULE | Freq: Three times a day (TID) | ORAL | Status: DC
  Administered 2018-04-12 – 2018-04-16 (×12): 100 mg via ORAL
  Filled 2018-04-12 (×12): qty 1

## 2018-04-12 MED ORDER — ACETAMINOPHEN 325 MG PO TABS: 650.0000 mg | ORAL_TABLET | Freq: Four times a day (QID) | ORAL | Status: DC | PRN

## 2018-04-12 MED ORDER — FUROSEMIDE 80 MG PO TABS
80.0000 mg | ORAL_TABLET | Freq: Two times a day (BID) | ORAL | Status: DC
  Administered 2018-04-12 – 2018-04-16 (×8): 80 mg via ORAL
  Filled 2018-04-12 (×8): qty 1

## 2018-04-12 MED ORDER — OXYBUTYNIN CHLORIDE 5 MG PO TABS
5.0000 mg | ORAL_TABLET | Freq: Three times a day (TID) | ORAL | Status: DC
  Administered 2018-04-12 – 2018-04-16 (×12): 5 mg via ORAL
  Filled 2018-04-12 (×12): qty 1

## 2018-04-12 MED ORDER — APIXABAN 5 MG PO TABS
5.0000 mg | ORAL_TABLET | Freq: Two times a day (BID) | ORAL | Status: DC
  Administered 2018-04-12 – 2018-04-16 (×8): 5 mg via ORAL
  Filled 2018-04-12 (×8): qty 1

## 2018-04-12 MED ORDER — INSULIN DEGLUDEC 200 UNIT/ML ~~LOC~~ SOPN: 45.0000 [IU] | PEN_INJECTOR | Freq: Every day | SUBCUTANEOUS | Status: DC

## 2018-04-12 MED ORDER — PRAVASTATIN SODIUM 10 MG PO TABS
20.0000 mg | ORAL_TABLET | Freq: Every day | ORAL | Status: DC
  Administered 2018-04-13 – 2018-04-15 (×3): 20 mg via ORAL
  Filled 2018-04-12 (×3): qty 2

## 2018-04-12 MED ORDER — SENNOSIDES-DOCUSATE SODIUM 8.6-50 MG PO TABS: 1.0000 | ORAL_TABLET | Freq: Every evening | ORAL | Status: DC | PRN

## 2018-04-12 MED ORDER — OXYCODONE-ACETAMINOPHEN 5-325 MG PO TABS
1.0000 | ORAL_TABLET | Freq: Four times a day (QID) | ORAL | Status: DC | PRN
  Administered 2018-04-12: 1 via ORAL
  Filled 2018-04-12: qty 1

## 2018-04-12 MED ORDER — INSULIN ASPART 100 UNIT/ML ~~LOC~~ SOLN
10.0000 [IU] | Freq: Three times a day (TID) | SUBCUTANEOUS | Status: DC
  Administered 2018-04-13 – 2018-04-16 (×9): 10 [IU] via SUBCUTANEOUS

## 2018-04-12 MED ORDER — ACETAMINOPHEN 650 MG RE SUPP: 650.0000 mg | Freq: Four times a day (QID) | RECTAL | Status: DC | PRN

## 2018-04-12 MED ORDER — CEFAZOLIN SODIUM-DEXTROSE 2-4 GM/100ML-% IV SOLN
2.0000 g | Freq: Three times a day (TID) | INTRAVENOUS | Status: DC
  Administered 2018-04-13: 2 g via INTRAVENOUS
  Filled 2018-04-12 (×2): qty 100

## 2018-04-12 MED ORDER — CALCITRIOL 0.5 MCG PO CAPS
0.5000 ug | ORAL_CAPSULE | Freq: Every day | ORAL | Status: DC
  Administered 2018-04-13 – 2018-04-16 (×4): 0.5 ug via ORAL
  Filled 2018-04-12 (×4): qty 1

## 2018-04-12 NOTE — H&P (Addendum)
Date: 04/12/2018               Patient Name:  [name] MRN: [id number]  DOB: 1975-11-05 Age / Sex: 43 y.o., male   PCP: [name], DO         Medical Service: Internal Medicine Teaching Service         Attending Physician: Dr. Daryll Drown    First Contact: Dr. Eileen Stanford Pager: [phone]  Second Contact: Dr. Trilby Drummer Pager: [phone]       After Hours (After 5p/  First Contact Pager: [phone]  weekends / holidays): Second Contact Pager: [phone]   Chief Complaint: LLE pain and swelling  History of Present Illness: [name] is a 43 yo man with a mdical history of CAD, HTN, diastolic heart failure, insulin-dependent DMII, DVT and PE on eliquis, CKDIII, R AKA, and OSA who presented to the ED from the internal medicine clinic for left lower extremity pain concerning for cellulitis vs abscess. He was first seen in clinic for this problem on 3/5. The pain had been present for three weeks at that time and started after he bumped his leg on something sharp. A LLE doppler was performed and was negative for DVT. As the swelling and pain progressively worsened, he was given a 5-day course of keflex from 3/13-3/17. He was re-evaluated in clinic on 3/24 with persistent pain and swelling. He was then started on a 10-day course of Keflex, which ended today. Of note, he also took a 5 day course of cipro 3/13-3/17 for a UTI per nephrology. His pain and swelling has continued despite treatment with PO abx. He was sent to the ED from the clinic to get an [place] to rule out abscess formation. He states the pain is currently an 8/10 and is primarily on the lateral aspect of the left leg. He has lower extremity swelling at baseline, and he states that the swelling in his foot and ankle is at baseline. He denies fevers, chills, dyspnea, chest pain, abdominal pain, n/v/d.   Upon arrival to the ED, the patient was afebrile and hemodynamically stable. Labs significant for leukocytosis of 12.5, Hb 9.5 (at  baseline), Na 133, glucose 241, BUN 83, Cr 3.24 (baseline 2-3). LLE US showed subcutaneous edema, but no focal mass or fluid collection.  Meds:  Current Meds  Medication Sig  . allopurinol (ZYLOPRIM) 100 MG tablet TAKE 2 TABLETS BY MOUTH ONCE DAILY (Patient taking differently: Take 200 mg by mouth daily. )  . Alpha-Lipoic Acid 200 MG CAPS Take 1 capsule (200 mg total) by mouth daily.  Marland Kitchen apixaban (ELIQUIS) 5 MG TABS tablet Take 1 tablet (5 mg total) by mouth 2 (two) times daily.  Marland Kitchen aspirin EC 81 MG tablet Take 162 mg by mouth as needed for mild pain (or headaches).   . calcitRIOL (ROCALTROL) 0.5 MCG capsule Take 0.5 mcg by mouth daily.  . carvedilol (COREG) 25 MG tablet TAKE 1 TABLET BY MOUTH TWICE DAILY (Patient taking differently: Take 25 mg by mouth 2 (two) times daily with a meal. )  . cephALEXin (KEFLEX) 500 MG capsule Take 1 capsule (500 mg total) by mouth 2 (two) times daily for 10 days.  . cloNIDine (CATAPRES) 0.3 MG tablet Take 1 tablet (0.3 mg total) by mouth 2 (two) times daily.  . Cyanocobalamin (B-12) 2500 MCG SUBL Place 2,500 mcg under the tongue daily.  . furosemide (LASIX) 40 MG tablet Take 80 mg by mouth 2 (two) times daily.  Marland Kitchen  gabapentin (NEURONTIN) 100 MG capsule Take 100 mg by mouth 3 (three) times daily. Take 1 tablet by mouth three times daily.  . hydrALAZINE (APRESOLINE) 50 MG tablet Take 50 mg by mouth 4 (four) times daily.  . insulin aspart (NOVOLOG) 100 UNIT/ML injection Inject 15 Units into the skin 3 (three) times daily before meals.   . Insulin Degludec 200 UNIT/ML SOPN Inject 60 Units into the skin daily. (Patient taking differently: Inject 60 Units into the skin daily before breakfast. )  . lovastatin (MEVACOR) 20 MG tablet TAKE 1 TABLET BY MOUTH ONCE DAILY (Patient taking differently: Take 20 mg by mouth daily at 6 PM. )  . metolazone (ZAROXOLYN) 2.5 MG tablet Take 2.5 mg by mouth every other day.  . Omega-3 Fatty Acids (FISH OIL PO) Take 1 capsule by mouth at  bedtime.  Marland Kitchen oxybutynin (DITROPAN) 5 MG tablet Take 5 mg by mouth 3 (three) times daily.  . promethazine (PHENERGAN) 12.5 MG tablet Take 1 tablet (12.5 mg total) by mouth every 8 (eight) hours as needed for nausea or vomiting.  . tamsulosin (FLOMAX) 0.4 MG CAPS capsule Take 0.4 mg by mouth at bedtime.    Allergies: Allergies as of 04/12/2018 - Review Complete 04/12/2018  Allergen Reaction Noted  . Reglan [metoclopramide] Other (See Comments) 11/01/2016   Past Medical History:  Diagnosis Date  . Acute venous embolism and thrombosis of deep vessels of proximal lower extremity ([place]) 07/19/2011  . Anemia   . Chest pain, neg MI, normal coronaries by cath 02/18/2013  . CKD (chronic kidney disease) stage 3, GFR 30-59 ml/min (HCC) 02/19/2013  . Colon polyps   . Diabetic ulcer of right foot ([place])   . DVT (deep venous thrombosis) ([place]) 09/2002   patient reports additional DVTs in '06 & '11 (unconfirmed)  . GERD (gastroesophageal reflux disease)   . History of blood transfusion    "related to OR" (10/31/2016)  . Hyperlipidemia 02/19/2013  . Hypertension   . Nausea & vomiting    "constant for the last couple weeks" (10/31/2016)  . Nephrotic syndrome   . Obesity    BMI 44, weight 346 pounds 01/30/14  . OSA (obstructive sleep apnea) 02/28/2018   Mild obstructive sleep apnea with an AHI of 9.8/h but severe during REM sleep with an AHI of 33.8/h.  Oxygen saturations dropped to 86% and there was moderate snoring  . Prosthesis adjustment 08/17/2016  . Pulmonary embolism ([place]) 09/2002   treated with 6 months of warfarin  . Pyelonephritis 02/02/2018  . Type I diabetes mellitus ([place]) dx'd 2001   Past Surgical History:  Procedure Laterality Date  . AMPUTATION Right 12/22/2013   Procedure: AMPUTATION BELOW KNEE;  Surgeon: [name], MD;  Location: [place];  Service: Orthopedics;  Laterality: Right;  . AMPUTATION Right 02/19/2014   Procedure: RIGHT ABOVE KNEE AMPUTATION ;  Surgeon: [name], MD;  Location:  [place];  Service: Orthopedics;  Laterality: Right;  . CARDIAC CATHETERIZATION  02/18/2013   normal coronaries  . ESOPHAGOGASTRODUODENOSCOPY N/A 11/02/2016   Procedure: ESOPHAGOGASTRODUODENOSCOPY (EGD);  Surgeon: [name], MD;  Location: [facility] ENDOSCOPY;  Service: Endoscopy;  Laterality: N/A;  . I&D EXTREMITY Right 12/14/2013   Procedure: IRRIGATION AND DEBRIDEMENT RIGHT FOOT;  Surgeon: [name], MD;  Location: [place];  Service: Orthopedics;  Laterality: Right;  . INCISION AND DRAINAGE ABSCESS  2007; 2015   "back"  . INCISION AND DRAINAGE OF WOUND Right 12/22/2013   Procedure: I&D RIGHT BUTTOCK;  Surgeon: Jenny Reichmann  Doran Durand, MD;  Location: [place];  Service: Orthopedics;  Laterality: Right;  . LEFT HEART CATHETERIZATION WITH CORONARY ANGIOGRAM N/A 02/18/2013   Procedure: LEFT HEART CATHETERIZATION WITH CORONARY ANGIOGRAM;  Surgeon: [name], MD;  [facility] CATH LAB;  Service: Cardiovascular;  Laterality: N/A;    Family History:  Family History  Problem Relation Age of Onset  . Heart disease Mother   . Diabetes Mother   . Diabetes Maternal Uncle   . Diabetes Cousin    Social History: Lives alone. Never smoker. Rare etoh use. Denies illicit drug use.  Review of Systems: A complete ROS was negative except as per HPI.   Physical Exam: Blood pressure (!) 156/73, pulse 74, temperature 97.9 F (36.6 C), temperature source Oral, resp. rate 18, height 5\' 8"  (1.727 m), weight (!) 163 kg, SpO2 96 %.  Constitutional: Obese male, no distress. Eyes: Pupils are equal, round, and reactive to light. EOM are normal.  Cardiovascular: Normal rate and regular rhythm. No murmurs, rubs, or gallops. Pulmonary/Chest: Effort normal. Clear to auscultation bilaterally. No wheezes, rales, or rhonchi. Abdominal: Bowel sounds present. Soft, non-distended, non-tender. Ext: Right AKA. Left lower extremity 2+ pitting edema from the foot to the thigh. There is an 11cm area of induration on the lateral leg  without erythema, increased warmth, or drainage. This area is severely ttp. Skin: Warm and dry.   Assessment & Plan by Problem: Active Problems:   Cellulitis  Mr. Ellers is a 43 yo man with a mdical history of CAD, HTN, diastolic heart failure, insulin-dependent DMII, DVT and PE on eliquis, CKDIII, R AKA, and OSA who presented with persistent LLE pain and edema despite outpatient treatment of cellulitis with Keflex. He was afebrile, but had a leukocytosis of 12. No abscesses seen on ultrasound. He was admitted for IV treatment for cellulitis as he failed outpatient treatment.  Cellulitis of the LLE - Mr. Puccinelli is a 43 yo man with a mdical history of CAD, HTN, diastolic heart failure, insulin-dependent DMII, DVT and PE on eliquis, CKDIII, R AKA, and OSA who presented with persistent LLE pain and edema despite outpatient treatment of cellulitis with Keflex. He was afebrile, but had a leukocytosis of 12. No systemic symptoms. No abscesses seen on ultrasound. He was admitted for IV treatment for cellulitis as he failed outpatient treatment. He received vancomycin in the ED. He may have failed outpatient antibiotics due to underlying PVD. He had normal ABIs in 2013, but has since had a right AKA for diabetic complications.  - Received vancomycin in the ED. Narrow to ancef tomorrow - F/u blood cx - Oxycodone-acetaminophen 5-325mg  q6hrs prn for pain  Insulin-dependent DMII - A1c was 5.6 one month ago. Home regimen includes insulin degludec 60u daily, novolog 15u TID with meals, and victoza 1.2mg  daily. Plan - Lantus 45u daily - Novlog 10u TID with meals - SSI - Continue home gabapentin 100mg  TID  HTN - Currently normotensive. Home regimen includes coreg 25mg  bid, clonidine 0.3mg  BID, hydralazine 50mg  QID, lasix 80mg  BID, and metolazone 2.5mg  every other day Plan - Continue home lasix and clonidine - Holding home coreg and hydralazine for normal BP. Can be restarted if patient becomes  hypertensive.  Diastolic heart failure - Last ECHO showed normal systolic function and grade 2 diastolic dysfunction 05/1759. Home diuretic regimen includes lasix 80mg  BID and metolazone 2.5mg  every other day. Appears euvolemic today.  Plan - Continue home lasix - Holding home metolazone in the setting of possible AKI  Possible  AKI on CKDIII - Variable baseline, ranging from 2-3 over the past year. Cr was 3.2 today. Possibly prerenal in the setting of infection. Possibly side effect of aggressive diuretic regimen.  Plan - Trend Cr - Hold home metolazone  - Continue home calcitriol   Chronic normocytic anemia 2/2 CKD - Hemoglobin 9, at baseline.  History of DVT and PE: Continue home eliquis BPH: Continue home flomax Overactive bladder: Continue home oxybutynin  HLD: Continue home lovastatin Gout: Continue home allopurinol   FEN: no IVF, heart healthy/CM diet, replace electrolytes as needed  DVT ppx: home eliquis Code status: FULL code  Dispo: Admit patient to Observation with expected length of stay less than 2 midnights.  Signed: Corinne Ports, MD 04/12/2018, 9:55 PM  Pager: [phone]

## 2018-04-12 NOTE — Patient Instructions (Addendum)
We will need the results of the ultrasound before making any further management decisions.  Continue wearing your ted hose as tolerated, we will need to further assess your blood flow to your leg at some point when there is less edema present.

## 2018-04-12 NOTE — ED Provider Notes (Signed)
Emergency Department Provider Note   I have reviewed the triage vital signs and the nursing notes.   HISTORY  Chief Complaint Leg Swelling and Leg Pain   HPI [name] is a 43 y.o. male who presents to the emergency department today with well over a month worth of symptoms.  Patient states he has had some swelling and pain to his left lower extremity where he had multiple ultrasounds that ruled out DVT and point-of-care ultrasound that ruled out abscess a week ago.  Went to see an internal medicine doctor today who sent him to the emergency room for "a deeper look".  I am not sure why he is here at this point.  He had no fevers, nausea, vomiting or worsening of his symptoms quickly.   No other associated or modifying symptoms.    Past Medical History:  Diagnosis Date  . Acute venous embolism and thrombosis of deep vessels of proximal lower extremity ([place]) 07/19/2011  . Anemia   . Chest pain, neg MI, normal coronaries by cath 02/18/2013  . CKD (chronic kidney disease) stage 3, GFR 30-59 ml/min (HCC) 02/19/2013  . Colon polyps   . Diabetic ulcer of right foot ([place])   . DVT (deep venous thrombosis) ([place]) 09/2002   patient reports additional DVTs in '06 & '11 (unconfirmed)  . GERD (gastroesophageal reflux disease)   . History of blood transfusion    "related to OR" (10/31/2016)  . Hyperlipidemia 02/19/2013  . Hypertension   . Nausea & vomiting    "constant for the last couple weeks" (10/31/2016)  . Nephrotic syndrome   . Obesity    BMI 44, weight 346 pounds 01/30/14  . OSA (obstructive sleep apnea) 02/28/2018   Mild obstructive sleep apnea with an AHI of 9.8/h but severe during REM sleep with an AHI of 33.8/h.  Oxygen saturations dropped to 86% and there was moderate snoring  . Prosthesis adjustment 08/17/2016  . Pulmonary embolism ([place]) 09/2002   treated with 6 months of warfarin  . Pyelonephritis 02/02/2018  . Type I diabetes mellitus ([place]) dx'd 2001    Patient Active Problem  List   Diagnosis Date Noted  . Leg swelling   . Pain   . Cellulitis 04/12/2018  . Pain of left lower extremity 03/20/2018  . OSA (obstructive sleep apnea) 02/28/2018  . Intractable nausea and vomiting 02/09/2018  . Chronic diastolic heart failure ([place]) 02/02/2018  . Healthcare maintenance 08/15/2017  . Erectile dysfunction 04/22/2017  . History of pulmonary embolus (PE) 06/09/2016  . Unilateral AKA, right ([place]) 12/26/2013  . Hyperlipidemia 02/19/2013  . S/P cardiac cath, 02/18/13, normal coronaries 02/19/2013  . Chronic renal insufficiency, stage 3 (moderate) ([place]) 02/19/2013  . Hypertension 07/24/2011  . Long term current use of anticoagulant therapy 07/19/2011  . History of DVT (deep vein thrombosis) 07/28/2009  . Morbid obesity ([place]) 10/26/2005  . Insulin dependent type 2 diabetes mellitus, controlled ([place]) 01/09/2002    Past Surgical History:  Procedure Laterality Date  . AMPUTATION Right 12/22/2013   Procedure: AMPUTATION BELOW KNEE;  Surgeon: [name], MD;  Location: [place];  Service: Orthopedics;  Laterality: Right;  . AMPUTATION Right 02/19/2014   Procedure: RIGHT ABOVE KNEE AMPUTATION ;  Surgeon: [name], MD;  Location: [place];  Service: Orthopedics;  Laterality: Right;  . CARDIAC CATHETERIZATION  02/18/2013   normal coronaries  . ESOPHAGOGASTRODUODENOSCOPY N/A 11/02/2016   Procedure: ESOPHAGOGASTRODUODENOSCOPY (EGD);  Surgeon: [name], MD;  Location: [facility] ENDOSCOPY;  Service: Endoscopy;  Laterality: N/A;  . I&D EXTREMITY Right 12/14/2013   Procedure: IRRIGATION AND DEBRIDEMENT RIGHT FOOT;  Surgeon: [name], MD;  Location: [place];  Service: Orthopedics;  Laterality: Right;  . INCISION AND DRAINAGE ABSCESS  2007; 2015   "back"  . INCISION AND DRAINAGE OF WOUND Right 12/22/2013   Procedure: I&D RIGHT BUTTOCK;  Surgeon: [name], MD;  Location: [place];  Service: Orthopedics;  Laterality: Right;  . LEFT HEART CATHETERIZATION WITH CORONARY ANGIOGRAM N/A  02/18/2013   Procedure: LEFT HEART CATHETERIZATION WITH CORONARY ANGIOGRAM;  Surgeon: [name], MD;  Location: [facility] CATH LAB;  Service: Cardiovascular;  Laterality: N/A;      Allergies Reglan [metoclopramide]  Family History  Problem Relation Age of Onset  . Heart disease Mother   . Diabetes Mother   . Diabetes Maternal Uncle   . Diabetes Cousin     Social History Social History   Tobacco Use  . Smoking status: Never Smoker  . Smokeless tobacco: Never Used  Substance Use Topics  . Alcohol use: Yes    Comment: 10/31/2016 "a few beers q 6 months or so"  . Drug use: No    Review of Systems  All other systems negative except as documented in the HPI. All pertinent positives and negatives as reviewed in the HPI. ____________________________________________   PHYSICAL EXAM:  VITAL SIGNS: Vitals:   04/13/18 0544 04/13/18 1434 04/13/18 1947 04/13/18 2318  BP: 139/73 133/66 (!) 151/75   Pulse: 74 73 72 73  Resp: 18 20 16 18   Temp: 98.2 F (36.8 C) 98.3 F (36.8 C) 98.2 F (36.8 C)   TempSrc: Oral Oral Oral   SpO2: 100% 95% 99% 99%  Weight:      Height:         Constitutional: Alert and oriented. Well appearing and in no acute distress. Eyes: Conjunctivae are normal. PERRL. EOMI. Head: Atraumatic. Nose: No congestion/rhinnorhea. Mouth/Throat: Mucous membranes are moist.  Oropharynx non-erythematous. Neck: No stridor.  No meningeal signs.   Cardiovascular: Normal rate, regular rhythm. Good peripheral circulation. Grossly normal heart sounds.   Respiratory: Normal respiratory effort.  No retractions. Lungs CTAB. Gastrointestinal: Soft and nontender. No distention.  Musculoskeletal: No lower extremity tenderness nor edema. LLE edema, R BKA. Neurologic:  Normal speech and language. No gross focal neurologic deficits are appreciated.  Skin:  Left lateral leg has approximately 12 x 15 cm area of induration, tenderness but not warmth or drainage.  No area of  fluctuance.   ____________________________________________   LABS (all labs ordered are listed, but only abnormal results are displayed)  Labs Reviewed  CBC WITH DIFFERENTIAL/PLATELET - Abnormal; Notable for the following components:      Result Value   WBC 12.5 (*)    RBC 3.52 (*)    Hemoglobin 9.5 (*)    HCT 31.0 (*)    Neutro Abs 9.8 (*)    All other components within normal limits  COMPREHENSIVE METABOLIC PANEL - Abnormal; Notable for the following components:   Sodium 133 (*)    Glucose, Bld 241 (*)    BUN 83 (*)    Creatinine, Ser 3.24 (*)    Calcium 8.8 (*)    Albumin 3.0 (*)    GFR calc non Af Amer 22 (*)    GFR calc Af Amer 26 (*)    All other components within normal limits  BASIC METABOLIC PANEL - Abnormal; Notable for the following components:   Sodium 132 (*)  Chloride 96 (*)    Glucose, Bld 266 (*)    BUN 78 (*)    Creatinine, Ser 2.83 (*)    GFR calc non Af Amer 26 (*)    GFR calc Af Amer 31 (*)    All other components within normal limits  CBC - Abnormal; Notable for the following components:   WBC 11.0 (*)    RBC 3.37 (*)    Hemoglobin 9.0 (*)    HCT 29.2 (*)    All other components within normal limits  GLUCOSE, CAPILLARY - Abnormal; Notable for the following components:   Glucose-Capillary 184 (*)    All other components within normal limits  GLUCOSE, CAPILLARY - Abnormal; Notable for the following components:   Glucose-Capillary 192 (*)    All other components within normal limits  GLUCOSE, CAPILLARY - Abnormal; Notable for the following components:   Glucose-Capillary 187 (*)    All other components within normal limits  GLUCOSE, CAPILLARY - Abnormal; Notable for the following components:   Glucose-Capillary 233 (*)    All other components within normal limits  CBC - Abnormal; Notable for the following components:   WBC 10.7 (*)    RBC 3.41 (*)    Hemoglobin 9.4 (*)    HCT 29.7 (*)    All other components within normal limits  BASIC  METABOLIC PANEL - Abnormal; Notable for the following components:   Sodium 134 (*)    Glucose, Bld 194 (*)    BUN 77 (*)    Creatinine, Ser 2.57 (*)    GFR calc non Af Amer 30 (*)    GFR calc Af Amer 34 (*)    All other components within normal limits  GLUCOSE, CAPILLARY - Abnormal; Notable for the following components:   Glucose-Capillary 224 (*)    All other components within normal limits  GLUCOSE, CAPILLARY - Abnormal; Notable for the following components:   Glucose-Capillary 236 (*)    All other components within normal limits  CULTURE, BLOOD (ROUTINE X 2)  CULTURE, BLOOD (ROUTINE X 2)  HIV ANTIBODY (ROUTINE TESTING W REFLEX)  VANCOMYCIN, RANDOM   ____________________________________________  RADIOLOGY  No results found.  ____________________________________________   PROCEDURES  Procedure(s) performed:   Procedures   ____________________________________________   INITIAL IMPRESSION / ASSESSMENT AND PLAN / ED COURSE  Will attempt to discuss with PCP who sent him in to decide what they wanted done. Appears to be c/w cellulitis. May need another round of antibiotics.  Discussed with primary care provider who was concerned about a deep abscess so an ultrasound was ordered which ruled that out but did show significant interstitial edema consistent with cellulitis.  Discussed with the patient prefers to be admitted so we will start antibiotics and admit to the hospital for same.  Pertinent labs & imaging results that were available during my care of the patient were reviewed by me and considered in my medical decision making (see chart for details).  ____________________________________________  FINAL CLINICAL IMPRESSION(S) / ED DIAGNOSES  Final diagnoses:  Leg swelling  Pain     MEDICATIONS GIVEN DURING THIS VISIT:  Medications  allopurinol (ZYLOPRIM) tablet 200 mg (200 mg Oral Given 04/13/18 0919)  cloNIDine (CATAPRES) tablet 0.3 mg (0.3 mg Oral Given  04/13/18 2117)  furosemide (LASIX) tablet 80 mg (80 mg Oral Given 04/13/18 1818)  pravastatin (PRAVACHOL) tablet 20 mg (20 mg Oral Given 04/13/18 1818)  calcitRIOL (ROCALTROL) capsule 0.5 mcg (0.5 mcg Oral Given 04/13/18 0918)  oxybutynin (DITROPAN)  tablet 5 mg (5 mg Oral Given 04/13/18 2118)  tamsulosin (FLOMAX) capsule 0.4 mg (0.4 mg Oral Given 04/13/18 2117)  apixaban (ELIQUIS) tablet 5 mg (5 mg Oral Given 04/13/18 2117)  vitamin B-12 (CYANOCOBALAMIN) tablet 2,500 mcg (2,500 mcg Oral Given 04/13/18 0919)  gabapentin (NEURONTIN) capsule 100 mg (100 mg Oral Given 04/13/18 2117)  acetaminophen (TYLENOL) tablet 650 mg (has no administration in time range)    Or  acetaminophen (TYLENOL) suppository 650 mg (has no administration in time range)  senna-docusate (Senokot-S) tablet 1 tablet (has no administration in time range)  insulin aspart (novoLOG) injection 0-15 Units (5 Units Subcutaneous Given 04/13/18 1817)  insulin aspart (novoLOG) injection 10 Units (10 Units Subcutaneous Given 04/13/18 1817)  insulin glargine (LANTUS) injection 45 Units (45 Units Subcutaneous Given 04/13/18 0918)  oxyCODONE-acetaminophen (PERCOCET/ROXICET) 5-325 MG per tablet 1 tablet (1 tablet Oral Given 04/13/18 1819)  HYDROmorphone (DILAUDID) tablet 1-2 mg (2 mg Oral Given 04/14/18 0209)  carvedilol (COREG) tablet 25 mg (25 mg Oral Given 04/13/18 2118)  vancomycin variable dose per unstable renal function (pharmacist dosing) (has no administration in time range)  white petrolatum (VASELINE) gel (has no administration in time range)  vancomycin (VANCOCIN) 2,000 mg in sodium chloride 0.9 % 500 mL IVPB (0 mg Intravenous Stopped 04/12/18 2152)  HYDROmorphone (DILAUDID) injection 1 mg (1 mg Intravenous Given 04/13/18 0353)  vancomycin (VANCOCIN) 2,000 mg in sodium chloride 0.9 % 500 mL IVPB (2,000 mg Intravenous New Bag/Given 04/13/18 1936)     NEW OUTPATIENT MEDICATIONS STARTED DURING THIS VISIT:  Current Discharge Medication List      Note:  This  note was prepared with assistance of Dragon voice recognition software. Occasional wrong-word or sound-a-like substitutions may have occurred due to the inherent limitations of voice recognition software.   Kyanna Mahrt, Corene Cornea, MD 04/14/18 [id number]

## 2018-04-12 NOTE — Progress Notes (Addendum)
CC: Left lower extremity pain  HPI:  [name] is a 43 y.o. male with PMH below.  Today we will address Left lower extremity pain.     Please see A&P for status of the patient's chronic medical conditions  Past Medical History:  Diagnosis Date  . Acute venous embolism and thrombosis of deep vessels of proximal lower extremity ([place]) 07/19/2011  . Anemia   . Chest pain, neg MI, normal coronaries by cath 02/18/2013  . CKD (chronic kidney disease) stage 3, GFR 30-59 ml/min (HCC) 02/19/2013  . Colon polyps   . Diabetic ulcer of right foot ([place])   . DVT (deep venous thrombosis) ([place]) 09/2002   patient reports additional DVTs in '06 & '11 (unconfirmed)  . GERD (gastroesophageal reflux disease)   . History of blood transfusion    "related to OR" (10/31/2016)  . Hyperlipidemia 02/19/2013  . Hypertension   . Nausea & vomiting    "constant for the last couple weeks" (10/31/2016)  . Nephrotic syndrome   . Obesity    BMI 44, weight 346 pounds 01/30/14  . OSA (obstructive sleep apnea) 02/28/2018   Mild obstructive sleep apnea with an AHI of 9.8/h but severe during REM sleep with an AHI of 33.8/h.  Oxygen saturations dropped to 86% and there was moderate snoring  . Prosthesis adjustment 08/17/2016  . Pulmonary embolism ([place]) 09/2002   treated with 6 months of warfarin  . Pyelonephritis 02/02/2018  . Type I diabetes mellitus ([place]) dx'd 2001   Review of Systems:  ROS: Pulmonary: pt denies increased work of breathing, shortness of breath,  Cardiac: pt denies palpitations, chest pain,  Abdominal: pt denies abdominal pain, nausea, vomiting, or diarrhea   Physical Exam:  Vitals:   04/12/18 1411  BP: (!) 125/57  Pulse: 85  Temp: 98.6 F (37 C)  TempSrc: Oral   Cardiac: JVP 9cm while upright in wheelchair, normal rate and rhythm, clear s1 and s2, 2/6 MR, no rubs or gallops Pulmonary: decreased breath sounds throughout 2/2 body habitus, not in distress Abdominal: non distended abdomen,  soft and nontender Extremities: R AKA present, LLE 2+ edema up to thigh, there is an 11cm well circumscribed indurated area on the lateral lower leg that is painful to palpation, not warm or erythematous Psych: Alert, conversant, in good spirits  Media Information   Document Information   Photos    04/12/2018 14:19  Attached To:  Office Visit on 04/12/18 with Katherine Roan, MD  Source Information   Makyah Lavigne, Jenne Pane, MD  [facility]     Social History   Socioeconomic History  . Marital status: Single    Spouse name: Not on file  . Number of children: 0  . Years of education: 15.5  . Highest education level: Not on file  Occupational History  . Occupation: [employer]  . Occupation: Lexicographer: [employer]   Social Needs  . Financial resource strain: Not on file  . Food insecurity:    Worry: Not on file    Inability: Not on file  . Transportation needs:    Medical: Not on file    Non-medical: Not on file  Tobacco Use  . Smoking status: Never Smoker  . Smokeless tobacco: Never Used  Substance and Sexual Activity  . Alcohol use: Yes    Comment: 10/31/2016 "a few beers q 6 months or so"  . Drug use: No  . Sexual activity: Yes  Lifestyle  .  Physical activity:    Days per week: Not on file    Minutes per session: Not on file  . Stress: Not on file  Relationships  . Social connections:    Talks on phone: Not on file    Gets together: Not on file    Attends religious service: Not on file    Active member of club or organization: Not on file    Attends meetings of clubs or organizations: Not on file    Relationship status: Not on file  . Intimate partner violence:    Fear of current or ex partner: Not on file    Emotionally abused: Not on file    Physically abused: Not on file    Forced sexual activity: Not on file  Other Topics Concern  . Not on file  Social History Narrative   Financial assistance approved for 100% discount at [facility] and  has [facility] card per [employer]   09/01/2009.      Lives in [place] with mother and sister.             Family History  Problem Relation Age of Onset  . Heart disease Mother   . Diabetes Mother   . Diabetes Maternal Uncle   . Diabetes Cousin     Assessment & Plan:   See Encounters Tab for problem based charting.  Patient discussed with Dr. Beryle Beams

## 2018-04-12 NOTE — Progress Notes (Signed)
Pt placed on full face CPAP and tolerating well at this time.  RT will continue to monitor.

## 2018-04-12 NOTE — ED Triage Notes (Signed)
Pt sent here from internal medicine for increased swelling and pain to his lower left leg. Pt reports that this has been going on since February.

## 2018-04-12 NOTE — Assessment & Plan Note (Addendum)
Pt with continue discomfort in LLE starting at the beginning of March he felt he bumped the area.  He   Started with a Took keflex and Dr. Lorrene Reid put him on cipro for UTI on March 13th for five days and felt it was healing better with both antibiotics.  Had a duplex US to rule out DVT which was negative for DVT.  Pain makes exercising difficult, swelling has been worse in his leg since March.  He does feel like he is retaining extra fluid.  No fevers or chills.  States his diabetes is under good control usually 160-180, now checking multiple times per day fluctuates was 340 yesterday but that is rare.  Weighs himself at home once a week 374-379 but he subtracts the 20lb weight of his prosthesis.  On chart review his weight has been stable for the last few months  -Formal US to evaluate indurated area, unfortunately has to go through the ED to get this, assess the need for I and D and or abx therapy. -will continue current diuretic regimen -would benefit from unna boot when able, likely has venous insufficiency as well, possibly he can be set up with PT  -encouraged to continue good glycemic management follow up with endocrinologist in 2 weeks

## 2018-04-12 NOTE — Plan of Care (Signed)

## 2018-04-12 NOTE — ED Notes (Signed)
ED TO INPATIENT HANDOFF REPORT  ED Nurse Name and Phone #:  Elmyra Ricks [phone]  S Name/Age/Gender [name] 43 y.o. male Room/Bed: 921J/941D  Code Status   Code Status: Prior  Home/SNF/Other Home Patient oriented to: self, place, time and situation Is this baseline? No   Triage Complete: Triage complete  Chief Complaint Lower L leg pain  Triage Note Pt sent here from internal medicine for increased swelling and pain to his lower left leg. Pt reports that this has been going on since February.    Allergies Allergies  Allergen Reactions  . Reglan [Metoclopramide] Other (See Comments)    Dysphoric reaction    Level of Care/Admitting Diagnosis ED Disposition    ED Disposition Condition Comment   Admit  Hospital Area: [facility] [[id number]]  Level of Care: Med-Surg [16]  Diagnosis: Cellulitis [408144]  Admitting Physician: Sid Falcon [phone]  Attending Physician: Sid Falcon [[id number]]  PT Class (Do Not Modify): Observation [104]  PT Acc Code (Do Not Modify): Observation [10022]       B Medical/Surgery History Past Medical History:  Diagnosis Date  . Acute venous embolism and thrombosis of deep vessels of proximal lower extremity ([place]) 07/19/2011  . Anemia   . Chest pain, neg MI, normal coronaries by cath 02/18/2013  . CKD (chronic kidney disease) stage 3, GFR 30-59 ml/min (HCC) 02/19/2013  . Colon polyps   . Diabetic ulcer of right foot ([place])   . DVT (deep venous thrombosis) ([place]) 09/2002   patient reports additional DVTs in '06 & '11 (unconfirmed)  . GERD (gastroesophageal reflux disease)   . History of blood transfusion    "related to OR" (10/31/2016)  . Hyperlipidemia 02/19/2013  . Hypertension   . Nausea & vomiting    "constant for the last couple weeks" (10/31/2016)  . Nephrotic syndrome   . Obesity    BMI 44, weight 346 pounds 01/30/14  . OSA (obstructive sleep apnea) 02/28/2018   Mild obstructive sleep apnea with an AHI of 9.8/h  but severe during REM sleep with an AHI of 33.8/h.  Oxygen saturations dropped to 86% and there was moderate snoring  . Prosthesis adjustment 08/17/2016  . Pulmonary embolism ([place]) 09/2002   treated with 6 months of warfarin  . Pyelonephritis 02/02/2018  . Type I diabetes mellitus ([place]) dx'd 2001   Past Surgical History:  Procedure Laterality Date  . AMPUTATION Right 12/22/2013   Procedure: AMPUTATION BELOW KNEE;  Surgeon: [name], MD;  Location: [facility];  Service: Orthopedics;  Laterality: Right;  . AMPUTATION Right 02/19/2014   Procedure: RIGHT ABOVE KNEE AMPUTATION ;  Surgeon: [name], MD;  Location: [facility];  Service: Orthopedics;  Laterality: Right;  . CARDIAC CATHETERIZATION  02/18/2013   normal coronaries  . ESOPHAGOGASTRODUODENOSCOPY N/A 11/02/2016   Procedure: ESOPHAGOGASTRODUODENOSCOPY (EGD);  Surgeon: [name], MD;  [facility] ENDOSCOPY;  Service: Endoscopy;  Laterality: N/A;  . I&D EXTREMITY Right 12/14/2013   Procedure: IRRIGATION AND DEBRIDEMENT RIGHT FOOT;  Surgeon: [name], MD;  Location: [facility];  Service: Orthopedics;  Laterality: Right;  . INCISION AND DRAINAGE ABSCESS  2007; 2015   "back"  . INCISION AND DRAINAGE OF WOUND Right 12/22/2013   Procedure: I&D RIGHT BUTTOCK;  Surgeon: [name], MD;  Location: [facility];  Service: Orthopedics;  Laterality: Right;  . LEFT HEART CATHETERIZATION WITH CORONARY ANGIOGRAM N/A 02/18/2013   Procedure: LEFT HEART CATHETERIZATION WITH CORONARY ANGIOGRAM;  Surgeon: [name], MD;  Location:  [facility] CATH LAB;  Service: Cardiovascular;  Laterality: N/A;     A IV Location/Drains/Wounds Patient Lines/Drains/Airways Status   Active Line/Drains/Airways    Name:   Placement date:   Placement time:   Site:   Days:   Peripheral IV 03/27/18 Left Hand   03/27/18    1359    Hand   16   Peripheral IV 04/12/18 Left Antecubital   04/12/18    1633    Antecubital   less than 1          Intake/Output Last 24 hours No intake or  output data in the 24 hours ending 04/12/18 1857  Labs/Imaging Results for orders placed or performed during the hospital encounter of 04/12/18 (from the past 48 hour(s))  CBC with Differential     Status: Abnormal   Collection Time: 04/12/18  4:14 PM  Result Value Ref Range   WBC 12.5 (H) 4.0 - 10.5 K/uL   RBC 3.52 (L) 4.22 - 5.81 MIL/uL   Hemoglobin 9.5 (L) 13.0 - 17.0 g/dL   HCT 31.0 (L) 39.0 - 52.0 %   MCV 88.1 80.0 - 100.0 fL   MCH 27.0 26.0 - 34.0 pg   MCHC 30.6 30.0 - 36.0 g/dL   RDW 14.3 11.5 - 15.5 %   Platelets 369 150 - 400 K/uL   nRBC 0.0 0.0 - 0.2 %   Neutrophils Relative % 78 %   Neutro Abs 9.8 (H) 1.7 - 7.7 K/uL   Lymphocytes Relative 13 %   Lymphs Abs 1.6 0.7 - 4.0 K/uL   Monocytes Relative 6 %   Monocytes Absolute 0.8 0.1 - 1.0 K/uL   Eosinophils Relative 2 %   Eosinophils Absolute 0.2 0.0 - 0.5 K/uL   Basophils Relative 1 %   Basophils Absolute 0.1 0.0 - 0.1 K/uL   Immature Granulocytes 0 %   Abs Immature Granulocytes 0.04 0.00 - 0.07 K/uL    Comment: Performed at [facility], [address] [place], [place] [address]  Comprehensive metabolic panel     Status: Abnormal   Collection Time: 04/12/18  4:14 PM  Result Value Ref Range   Sodium 133 (L) 135 - 145 mmol/L   Potassium 4.6 3.5 - 5.1 mmol/L   Chloride 100 98 - 111 mmol/L   CO2 23 22 - 32 mmol/L   Glucose, Bld 241 (H) 70 - 99 mg/dL   BUN 83 (H) 6 - 20 mg/dL   Creatinine, Ser 3.24 (H) 0.61 - 1.24 mg/dL   Calcium 8.8 (L) 8.9 - 10.3 mg/dL   Total Protein 7.2 6.5 - 8.1 g/dL   Albumin 3.0 (L) 3.5 - 5.0 g/dL   AST 21 15 - 41 U/L   ALT 17 0 - 44 U/L   Alkaline Phosphatase 105 38 - 126 U/L   Total Bilirubin 0.6 0.3 - 1.2 mg/dL   GFR calc non Af Amer 22 (L) >60 mL/min   GFR calc Af Amer 26 (L) >60 mL/min   Anion gap 10 5 - 15    Comment: Performed at [facility], [place] [address] [place],  [address]   [place] Lt Lower Extrem Ltd Soft Tissue Non Vascular  Result Date: 04/12/2018 CLINICAL  DATA:  Lateral left lower leg pain and swelling 2 months. EXAM: ULTRASOUND LEFT LOWER EXTREMITY LIMITED TECHNIQUE: Ultrasound examination of the lower extremity soft tissues was performed in the area of clinical concern. COMPARISON:  None. FINDINGS: Joint Space: Not evaluated. Muscles: Unremarkable.  Tendons: Unremarkable. Other Soft Tissue Structures: Moderate diffuse subcutaneous edema. No focal solid mass or focal fluid collection over the area of concern in the lateral left lower leg. IMPRESSION: Moderate diffuse subcutaneous edema over the left lower leg. No focal mass or focal fluid collection in the area of concern. Electronically Signed   By: Marin Olp M.D.   On: 04/12/2018 17:12    Pending Labs Unresulted Labs (From admission, onward)    Start     Ordered   04/12/18 1547  Blood culture (routine x 2)  BLOOD CULTURE X 2,   STAT     04/12/18 1546          Vitals/Pain Today's Vitals   04/12/18 1545 04/12/18 1546 04/12/18 1630  BP: (!) 159/74  139/82  Pulse:   78  Resp: 20    Temp: 98.8 F (37.1 C)    TempSrc: Oral    SpO2:   99%  Weight:  (!) 178.1 kg   Height:  6\' 2"  (1.88 m)     Isolation Precautions No active isolations  Medications Medications  vancomycin (VANCOCIN) 2,000 mg in sodium chloride 0.9 % 500 mL IVPB (has no administration in time range)    Mobility manual wheelchair Moderate fall risk   Focused Assessments Cardiac Assessment Handoff:    Lab Results  Component Value Date   TROPONINI 0.04 ([place]) 02/03/2018   No results found for: DDIMER Does the Patient currently have chest pain? No     R Recommendations: See Admitting Provider Note  Report given to:   Additional Notes:

## 2018-04-12 NOTE — Progress Notes (Signed)
Medicine attending: Medical history, presenting problems, physical findings, and medications, reviewed with resident physician Dr Guadlupe Spanish on the day of the patient visit and I concur with his evaluation and management plan. Pt C/O persistent area of tenderness R anterior tibial region soft tissues below knee: see image in chart. No fever. Area indurated, not fluctuant. No gross fluid collection on point of care [place]. Pt is diabetic. Prior AKA for uncontrolled infection. Recent antibiotics for Klebsiella UTI. Stage 4 CKD. Not clear whether this is a superficial cellulitis or underlying abscess. We will get a formal US by Radiology today to decide whether surg consultation for I&D indicated.

## 2018-04-13 ENCOUNTER — Encounter (HOSPITAL_COMMUNITY): Payer: Self-pay | Admitting: *Deleted

## 2018-04-13 DIAGNOSIS — Z89611 Acquired absence of right leg above knee: Secondary | ICD-10-CM | POA: Diagnosis not present

## 2018-04-13 DIAGNOSIS — N3281 Overactive bladder: Secondary | ICD-10-CM

## 2018-04-13 DIAGNOSIS — I2510 Atherosclerotic heart disease of native coronary artery without angina pectoris: Secondary | ICD-10-CM | POA: Diagnosis not present

## 2018-04-13 DIAGNOSIS — Z888 Allergy status to other drugs, medicaments and biological substances status: Secondary | ICD-10-CM

## 2018-04-13 DIAGNOSIS — Z86711 Personal history of pulmonary embolism: Secondary | ICD-10-CM | POA: Diagnosis not present

## 2018-04-13 DIAGNOSIS — E1022 Type 1 diabetes mellitus with diabetic chronic kidney disease: Secondary | ICD-10-CM | POA: Diagnosis present

## 2018-04-13 DIAGNOSIS — E1151 Type 2 diabetes mellitus with diabetic peripheral angiopathy without gangrene: Secondary | ICD-10-CM | POA: Diagnosis not present

## 2018-04-13 DIAGNOSIS — Z86718 Personal history of other venous thrombosis and embolism: Secondary | ICD-10-CM | POA: Diagnosis not present

## 2018-04-13 DIAGNOSIS — Z7901 Long term (current) use of anticoagulants: Secondary | ICD-10-CM

## 2018-04-13 DIAGNOSIS — E1122 Type 2 diabetes mellitus with diabetic chronic kidney disease: Secondary | ICD-10-CM

## 2018-04-13 DIAGNOSIS — M79605 Pain in left leg: Secondary | ICD-10-CM | POA: Diagnosis not present

## 2018-04-13 DIAGNOSIS — M109 Gout, unspecified: Secondary | ICD-10-CM

## 2018-04-13 DIAGNOSIS — I130 Hypertensive heart and chronic kidney disease with heart failure and stage 1 through stage 4 chronic kidney disease, or unspecified chronic kidney disease: Secondary | ICD-10-CM

## 2018-04-13 DIAGNOSIS — N400 Enlarged prostate without lower urinary tract symptoms: Secondary | ICD-10-CM | POA: Diagnosis not present

## 2018-04-13 DIAGNOSIS — G4733 Obstructive sleep apnea (adult) (pediatric): Secondary | ICD-10-CM | POA: Diagnosis not present

## 2018-04-13 DIAGNOSIS — Z87440 Personal history of urinary (tract) infections: Secondary | ICD-10-CM

## 2018-04-13 DIAGNOSIS — Z6841 Body Mass Index (BMI) 40.0 and over, adult: Secondary | ICD-10-CM | POA: Diagnosis not present

## 2018-04-13 DIAGNOSIS — I5030 Unspecified diastolic (congestive) heart failure: Secondary | ICD-10-CM | POA: Diagnosis not present

## 2018-04-13 DIAGNOSIS — E785 Hyperlipidemia, unspecified: Secondary | ICD-10-CM | POA: Diagnosis not present

## 2018-04-13 DIAGNOSIS — N183 Chronic kidney disease, stage 3 (moderate): Secondary | ICD-10-CM

## 2018-04-13 DIAGNOSIS — Z7982 Long term (current) use of aspirin: Secondary | ICD-10-CM | POA: Diagnosis not present

## 2018-04-13 DIAGNOSIS — Z79899 Other long term (current) drug therapy: Secondary | ICD-10-CM | POA: Diagnosis not present

## 2018-04-13 DIAGNOSIS — M7989 Other specified soft tissue disorders: Secondary | ICD-10-CM

## 2018-04-13 DIAGNOSIS — D631 Anemia in chronic kidney disease: Secondary | ICD-10-CM | POA: Diagnosis not present

## 2018-04-13 DIAGNOSIS — Z794 Long term (current) use of insulin: Secondary | ICD-10-CM

## 2018-04-13 DIAGNOSIS — L03116 Cellulitis of left lower limb: Secondary | ICD-10-CM | POA: Diagnosis not present

## 2018-04-13 DIAGNOSIS — I5032 Chronic diastolic (congestive) heart failure: Secondary | ICD-10-CM | POA: Diagnosis present

## 2018-04-13 DIAGNOSIS — K219 Gastro-esophageal reflux disease without esophagitis: Secondary | ICD-10-CM | POA: Diagnosis present

## 2018-04-13 DIAGNOSIS — N179 Acute kidney failure, unspecified: Secondary | ICD-10-CM | POA: Diagnosis present

## 2018-04-13 DIAGNOSIS — E11628 Type 2 diabetes mellitus with other skin complications: Secondary | ICD-10-CM | POA: Diagnosis present

## 2018-04-13 LAB — CBC
HCT: 29.2 % — ABNORMAL LOW (ref 39.0–52.0)
Hemoglobin: 9 g/dL — ABNORMAL LOW (ref 13.0–17.0)
MCH: 26.7 pg (ref 26.0–34.0)
MCHC: 30.8 g/dL (ref 30.0–36.0)
MCV: 86.6 fL (ref 80.0–100.0)
Platelets: 349 10*3/uL (ref 150–400)
RBC: 3.37 MIL/uL — ABNORMAL LOW (ref 4.22–5.81)
RDW: 14.3 % (ref 11.5–15.5)
WBC: 11 10*3/uL — ABNORMAL HIGH (ref 4.0–10.5)
nRBC: 0 % (ref 0.0–0.2)

## 2018-04-13 LAB — GLUCOSE, CAPILLARY
Glucose-Capillary: 187 mg/dL — ABNORMAL HIGH (ref 70–99)
Glucose-Capillary: 192 mg/dL — ABNORMAL HIGH (ref 70–99)
Glucose-Capillary: 233 mg/dL — ABNORMAL HIGH (ref 70–99)
Glucose-Capillary: 224 mg/dL — ABNORMAL HIGH (ref 70–99)
Glucose-Capillary: 236 mg/dL — ABNORMAL HIGH (ref 70–99)

## 2018-04-13 LAB — BASIC METABOLIC PANEL
Anion gap: 11 (ref 5–15)
BUN: 78 mg/dL — ABNORMAL HIGH (ref 6–20)
CO2: 25 mmol/L (ref 22–32)
Calcium: 8.9 mg/dL (ref 8.9–10.3)
Chloride: 96 mmol/L — ABNORMAL LOW (ref 98–111)
Creatinine, Ser: 2.83 mg/dL — ABNORMAL HIGH (ref 0.61–1.24)
GFR calc Af Amer: 31 mL/min — ABNORMAL LOW (ref >60)
GFR calc non Af Amer: 26 mL/min — ABNORMAL LOW (ref >60)
Glucose, Bld: 266 mg/dL — ABNORMAL HIGH (ref 70–99)
Potassium: 3.9 mmol/L (ref 3.5–5.1)
Sodium: 132 mmol/L — ABNORMAL LOW (ref 135–145)

## 2018-04-13 LAB — HIV ANTIBODY (ROUTINE TESTING W REFLEX): HIV Screen 4th Generation wRfx: NONREACTIVE

## 2018-04-13 MED ORDER — CARVEDILOL 25 MG PO TABS
25.0000 mg | ORAL_TABLET | Freq: Two times a day (BID) | ORAL | Status: DC
  Administered 2018-04-13 – 2018-04-16 (×7): 25 mg via ORAL
  Filled 2018-04-13 (×7): qty 1

## 2018-04-13 MED ORDER — HYDROMORPHONE HCL 1 MG/ML IJ SOLN
1.0000 mg | Freq: Once | INTRAMUSCULAR | Status: AC
  Administered 2018-04-13: 1 mg via INTRAVENOUS
  Filled 2018-04-13: qty 1

## 2018-04-13 MED ORDER — WHITE PETROLATUM EX OINT
TOPICAL_OINTMENT | CUTANEOUS | Status: AC
  Filled 2018-04-13: qty 28.35

## 2018-04-13 MED ORDER — VANCOMYCIN VARIABLE DOSE PER UNSTABLE RENAL FUNCTION (PHARMACIST DOSING): Status: DC

## 2018-04-13 MED ORDER — VANCOMYCIN HCL 10 G IV SOLR
2000.0000 mg | Freq: Once | INTRAVENOUS | Status: AC
  Administered 2018-04-13: 2000 mg via INTRAVENOUS
  Filled 2018-04-13: qty 2000

## 2018-04-13 MED ORDER — OXYCODONE-ACETAMINOPHEN 5-325 MG PO TABS
1.0000 | ORAL_TABLET | ORAL | Status: DC | PRN
  Administered 2018-04-13 – 2018-04-16 (×6): 1 via ORAL
  Filled 2018-04-13 (×6): qty 1

## 2018-04-13 MED ORDER — HYDROMORPHONE HCL 2 MG PO TABS
1.0000 mg | ORAL_TABLET | ORAL | Status: DC | PRN
  Administered 2018-04-13: 1 mg via ORAL
  Administered 2018-04-14: 2 mg via ORAL
  Filled 2018-04-13 (×2): qty 1

## 2018-04-13 NOTE — Progress Notes (Addendum)
Subjective: Mr. Schmieder was having some pain today, no acute events overnight. He reports that he is still having pain in his left leg today, states that the pain medications help with his pain. He denied any fevers, chills, nausea, vomiting, chest pain or shortness of breath. He reported that he slept well. He was asking about going home today. We discussed that since he did not respond well to outpatient therapy we would like to keep on IV antibiotics for now. We discussed the plan for today and he is in agreement.   Objective:  Vital signs in last 24 hours: Vitals:   04/12/18 1930 04/12/18 1959 04/13/18 0500 04/13/18 0544  BP: 138/79 (!) 156/73  139/73  Pulse: 74 74  74  Resp:  18  18  Temp:  97.9 F (36.6 C)  98.2 F (36.8 C)  TempSrc:  Oral  Oral  SpO2: 100% 96%  100%  Weight:  (!) 163 kg (!) 163 kg   Height:  5\' 8"  (1.727 m)      General: Well appearing male, NAD, laying comfortably in bed Cardiac: RRR, no m/r/g Pulmonary: CTABL, no wheezing or rhonchi Abdomen: Soft, non-tender, non-distended Extremity: Left LE lateral upper shin area has about 8cm x 5cm area that is TTP, darkened area that is warm to touch and has induration, with 2+ pitting edema. Right AKA.   Assessment/Plan:  Active Problems:   Cellulitis  This is a 43 year old male with a history of CAD, hypertension, diastolic heart failure, type 2 diabetes, DVT and PE on Eliquis, CKD stage III, right AKA, and obstructive sleep apnea presented with persistent left lower extremity pain and edema after failing outpatient treatment of cellulitis with Keflex.  He was found to be afebrile but had a leukocytosis of 12.  He had lower extremity ultrasound that showed no abscess.  Patient was admitted for IV treatment of cellulitis  Cellulitis of left lower extremity: -Patient reports that he is still having significant pain in his left leg but that the pain medications helped. He has remained afebrile and leukocytosis improved  to 11. On exam he still has the area of tenderness, warmth and darkened area on his left lateral leg. He got a dose of vancomycin in the ED and was started Ancef this morning by the night team.  Patient received 1 dose of Ancef this morning, will switch back to vancomycin due to his poor response to Keflex in the outpatient setting. He will need ABIs in the future once his acute infection has resolved.  -Continue vancomycin for cellulitis -F/u blood cultures -CBC in AM -Continue oxycodone-acetaminophen 5-325 PRN for pain -Add dilaudid 1-2 mg PO PRN for break through pain  Type 2 DM: -He is on insulin degludec 60 u daily, novolog 15u TID with meal, and victoza 1.2mg  daily at home.  -Continue lantus 45 units daily -Continue novolog 10 units TID AC -SSI -Frequent CBGs -Continue home gabapentin 100 mg TID  Hypertension: -Patient is on Coreg 25 mg twice daily, clonidine 0.3 mg twice daily, hydralazine 50 mg 4 times daily, Lasix 80 mg twice daily, metolazone 2.5 mg every other day.  Patient has been elevated, most recent one was 139/73.  -Continue clonidine 0.3 mg BID -Restart Coreg 25 BID -Continue to monitor  Diastolic heart failure: -Patient is on Lasix 80 mg twice daily and metolazone 2.5 mg every other day.  He still has lower extremity edema no crackles on exam. -Continue home Lasix 80 mg twice daily -Hold home  metolazone due to AKI  Possible AKI on CKD 3: -On admission creatinine was 3.2, baseline is variable from 2-3 so is unclear if this is an acute increase.  Holding home metolazone at this time. -Continue to hold metolazone -Continue home calcitriol -BMP in a.m.  Chronic normocytic anemia: Hemoglobin stable at baseline of 9. History of DVT and PE: Continue home eliquis BPH: Continue home flomax Overactive bladder: Continue home oxybutynin  HLD: Continue home lovastatin Gout: Continue home allopurinol  FEN: no fluids, replete lytes prn, HH diet  VTE ppx: Home eliquis Code  Status: FULL    Dispo: Anticipated discharge in approximately 1-2 day(s).   Asencion Noble, MD 04/13/2018, 6:50 AM Pager: [phone]

## 2018-04-13 NOTE — Progress Notes (Signed)
Patient place don CPAP and doing well. Will call if any further assistance needed.

## 2018-04-13 NOTE — Progress Notes (Signed)
Pharmacy Antibiotic Note  [name] is a 43 y.o. male admitted on 04/12/2018 with cellulitis.  Pharmacy has been consulted for Vancomycin dosing.  Serum creatinine on admission 3.24, today 2.83.  Plan: Vancomycin 2 grams IV yesterday and 2 grams IV x 1 today at 1900, then vanc variable dosing based on levels. Will draw a vanc random level tomorrow at 1200 noon. Follow renal function, C&S, and draw vanc levels as needed.  Height: 5\' 8"  (172.7 cm) Weight: (!) 359 lb 5.6 oz (163 kg) IBW/kg (Calculated) : 68.4  Temp (24hrs), Avg:98.3 F (36.8 C), Min:97.9 F (36.6 C), Max:98.8 F (37.1 C)  Recent Labs  Lab 04/12/18 1614 04/13/18 0200  WBC 12.5* 11.0*  CREATININE 3.24* 2.83*    Estimated Creatinine Clearance: 51.6 mL/min (A) (by C-G formula based on SCr of 2.83 mg/dL (H)).    Allergies  Allergen Reactions  . Reglan [Metoclopramide] Other (See Comments)    Dysphoric reaction    Antimicrobials this admission: Vanc 4/3 >>   Microbiology results: 4/3 BCx: ngtd  Thank you for allowing pharmacy to be a part of this patient's care.  Alanda Slim, PharmD, [facility] Clinical Pharmacist Please see AMION for all Pharmacists' Contact Phone Numbers 04/13/2018, 3:20 PM

## 2018-04-14 DIAGNOSIS — G4733 Obstructive sleep apnea (adult) (pediatric): Secondary | ICD-10-CM

## 2018-04-14 LAB — CBC
HCT: 29.7 % — ABNORMAL LOW (ref 39.0–52.0)
Hemoglobin: 9.4 g/dL — ABNORMAL LOW (ref 13.0–17.0)
MCH: 27.6 pg (ref 26.0–34.0)
MCHC: 31.6 g/dL (ref 30.0–36.0)
MCV: 87.1 fL (ref 80.0–100.0)
Platelets: 346 10*3/uL (ref 150–400)
RBC: 3.41 MIL/uL — ABNORMAL LOW (ref 4.22–5.81)
RDW: 14.5 % (ref 11.5–15.5)
WBC: 10.7 10*3/uL — ABNORMAL HIGH (ref 4.0–10.5)
nRBC: 0 % (ref 0.0–0.2)

## 2018-04-14 LAB — BASIC METABOLIC PANEL
Anion gap: 8 (ref 5–15)
BUN: 77 mg/dL — ABNORMAL HIGH (ref 6–20)
CO2: 27 mmol/L (ref 22–32)
Calcium: 8.9 mg/dL (ref 8.9–10.3)
Chloride: 99 mmol/L (ref 98–111)
Creatinine, Ser: 2.57 mg/dL — ABNORMAL HIGH (ref 0.61–1.24)
GFR calc Af Amer: 34 mL/min — ABNORMAL LOW (ref >60)
GFR calc non Af Amer: 30 mL/min — ABNORMAL LOW (ref >60)
Glucose, Bld: 194 mg/dL — ABNORMAL HIGH (ref 70–99)
Potassium: 3.8 mmol/L (ref 3.5–5.1)
Sodium: 134 mmol/L — ABNORMAL LOW (ref 135–145)

## 2018-04-14 LAB — VANCOMYCIN, RANDOM: Vancomycin Rm: 27

## 2018-04-14 LAB — GLUCOSE, CAPILLARY
Glucose-Capillary: 145 mg/dL — ABNORMAL HIGH (ref 70–99)
Glucose-Capillary: 170 mg/dL — ABNORMAL HIGH (ref 70–99)
Glucose-Capillary: 175 mg/dL — ABNORMAL HIGH (ref 70–99)
Glucose-Capillary: 145 mg/dL — ABNORMAL HIGH (ref 70–99)

## 2018-04-14 MED ORDER — HYDROMORPHONE HCL 1 MG/ML IJ SOLN
1.0000 mg | INTRAMUSCULAR | Status: DC | PRN
  Administered 2018-04-14: 1 mg via INTRAVENOUS
  Filled 2018-04-14 (×2): qty 1

## 2018-04-14 NOTE — Progress Notes (Signed)
  Date: 04/14/2018  Patient name: [name]  Medical record number: [id number]  Date of birth: Jul 13, 1975   I have seen and evaluated this patient and I have discussed the plan of care with the house staff. Please see Dr. Jerlyn Ly note for complete details. I concur with her findings and plan.   WBC improving.  Clinically improving.  Continue vancomycin.    Sid Falcon, MD 04/14/2018, 1:46 PM

## 2018-04-14 NOTE — Progress Notes (Addendum)
   Subjective: Mr. Mackins has continued to have pain of the left lower extremity.  He states that the p.o. pain medications that he is receiving does help alleviate the pain somewhat.  He does believe that the IV Dilaudid that he received several days ago tomorrow.  He does express frustration this morning stating that " it is always one thing after another" and he does not want to leave the hospital until this is all taking care of.  He denies any other issues including fevers, chills, nausea, vomiting, chest pain, or shortness of breath.  Objective:  Vital signs in last 24 hours: Vitals:   04/13/18 1434 04/13/18 1947 04/13/18 2318 04/14/18 0435  BP: 133/66 (!) 151/75  (!) 158/84  Pulse: 73 72 73 72  Resp: 20 16 18 16   Temp: 98.3 F (36.8 C) 98.2 F (36.8 C)  97.8 F (36.6 C)  TempSrc: Oral Oral  Oral  SpO2: 95% 99% 99% 99%  Weight:      Height:       General: Lying in bed in no acute distress Respiratory: Normal work of breathing, normal oxygen saturations on room air MSK: Right lower extremity-Right above-the-knee amputation.  Left lower extremity- approximately 8 cm x 4 cm area of induration and warmth to the touch.  This area is extremely tender to even light palpation.  He has 2+ pitting edema up to the knee.  Assessment/Plan:  Active Problems:   Cellulitis   Leg swelling   Pain  Mr. Kelley is a 43 year old gentleman with chronic diastolic heart failure, type 2 diabetes, history of DVT and PE on Eliquis, CKD stage III, and OSA who presented after failing outpatient antibiotics for cellulitis of the left lower extremity and admitted for IV antibiotic treatment.  Left lower extremity cellulitis: Blood cultures NGTD x 24 hrs.  Currently on IV vancomycin.  Leukocytosis is improved.  Vital signs have remained within normal limits with no fevers. - Continue vancomycin (day 3) - Follow-up blood cultures - Continue oxycodone-acetaminophen 5-325 mg as needed pain - Switch p.o.  Dilaudid 1 to IV as the patient reports to need pain with both p.o. medications.  Type 2 diabetes: Blood sugars have remained within acceptable limits. - Continue Lantus 45 units daily - Continue NovoLog 10 units 3 times daily AC - SSI moderate - Continue home gabapentin 100 mg 3 times daily  Hypertension: Home meds include Coreg, clonidine, hydralazine, Lasix, and metolazone.  While in the hospital we have continued carvedilol, clonidine, and Lasix.  Blood pressures have been elevated with SBP in the 150s but is within acceptable limits at this time.  Will hold off on adding back any of his other medications. - Continue carvedilol 25 mg twice daily - Continue clonidine 0.3 mg twice daily - Continue Lasix 80 mg twice daily  Chronic diastolic heart failure: Home medications include Lasix 80 mg twice daily and metolazone 2.5 mg every other day.  Acute on chronic kidney disease stage III: Creatinine has improved to 2.57 from admission (baseline between 2-3). - Continue to monitor  Chronic normocytic anemia: Hemoglobin stable at baseline of 9.4.  Continue to monitor. History of DVT and FF:[id number] home eliquis BPH: Continue home flomax Overactive bladder: Continue home oxybutynin  HLD: Continue home lovastatin Gout:Continue home allopurinol  FEN/GI: Heart healthy diet VTE prophylaxis: Home Eliquis CODE STATUS: Full  Dispo: Anticipated discharge in approximately 1-2 days.  Victor Sage, MD 04/14/2018, 6:57 AM Pager: [phone]

## 2018-04-14 NOTE — Progress Notes (Signed)
Pharmacy Antibiotic Note  [name] is a 43 y.o. male admitted on 04/12/2018 with cellulitis.  Pharmacy has been consulted for Vancomycin dosing.  Serum creatinine on admission 3.24> 2.83 > 2.57. Vancomycin random level approximately 18 hours after the last dose came back at 27 mcg/ml.  Will plan to redraw with am labs and redose when < 15 mcg/ml.  Plan: Continue Vanc variable dosing based on levels. Will draw a vanc random level tomorrow at 0500. Follow renal function, C&S, and draw vanc levels as needed.  Height: 5\' 8"  (172.7 cm) Weight: (!) 365 lb 11.9 oz (165.9 kg) IBW/kg (Calculated) : 68.4  Temp (24hrs), Avg:98.1 F (36.7 C), Min:97.8 F (36.6 C), Max:98.3 F (36.8 C)  Recent Labs  Lab 04/12/18 1614 04/13/18 0200 04/14/18 0225 04/14/18 1206  WBC 12.5* 11.0* 10.7*  --   CREATININE 3.24* 2.83* 2.57*  --   VANCORANDOM  --   --   --  27    Estimated Creatinine Clearance: 57.5 mL/min (A) (by C-G formula based on SCr of 2.57 mg/dL (H)).    Allergies  Allergen Reactions  . Reglan [Metoclopramide] Other (See Comments)    Dysphoric reaction    Antimicrobials this admission: Vanc 4/3 >>   Microbiology results: 4/3 BCx: ngtd  Thank you for allowing pharmacy to be a part of this patient's care.  Alanda Slim, PharmD, [facility] Clinical Pharmacist Please see AMION for all Pharmacists' Contact Phone Numbers 04/14/2018, 1:05 PM

## 2018-04-15 ENCOUNTER — Encounter (HOSPITAL_COMMUNITY): Payer: Self-pay

## 2018-04-15 LAB — CBC WITH DIFFERENTIAL/PLATELET
Abs Immature Granulocytes: 0.04 10*3/uL (ref 0.00–0.07)
Basophils Absolute: 0 10*3/uL (ref 0.0–0.1)
Basophils Relative: 0 %
Eosinophils Absolute: 0.5 10*3/uL (ref 0.0–0.5)
Eosinophils Relative: 4 %
HCT: 28.6 % — ABNORMAL LOW (ref 39.0–52.0)
Hemoglobin: 9 g/dL — ABNORMAL LOW (ref 13.0–17.0)
Immature Granulocytes: 0 %
Lymphocytes Relative: 19 %
Lymphs Abs: 2.2 10*3/uL (ref 0.7–4.0)
MCH: 27.6 pg (ref 26.0–34.0)
MCHC: 31.5 g/dL (ref 30.0–36.0)
MCV: 87.7 fL (ref 80.0–100.0)
Monocytes Absolute: 1 10*3/uL (ref 0.1–1.0)
Monocytes Relative: 9 %
Neutro Abs: 8.1 10*3/uL — ABNORMAL HIGH (ref 1.7–7.7)
Neutrophils Relative %: 68 %
Platelets: 342 10*3/uL (ref 150–400)
RBC: 3.26 MIL/uL — ABNORMAL LOW (ref 4.22–5.81)
RDW: 14.4 % (ref 11.5–15.5)
WBC: 11.9 10*3/uL — ABNORMAL HIGH (ref 4.0–10.5)
nRBC: 0 % (ref 0.0–0.2)

## 2018-04-15 LAB — BASIC METABOLIC PANEL
Anion gap: 11 (ref 5–15)
BUN: 74 mg/dL — ABNORMAL HIGH (ref 6–20)
CO2: 24 mmol/L (ref 22–32)
Calcium: 8.9 mg/dL (ref 8.9–10.3)
Chloride: 98 mmol/L (ref 98–111)
Creatinine, Ser: 2.45 mg/dL — ABNORMAL HIGH (ref 0.61–1.24)
GFR calc Af Amer: 37 mL/min — ABNORMAL LOW (ref >60)
GFR calc non Af Amer: 32 mL/min — ABNORMAL LOW (ref >60)
Glucose, Bld: 165 mg/dL — ABNORMAL HIGH (ref 70–99)
Potassium: 4 mmol/L (ref 3.5–5.1)
Sodium: 133 mmol/L — ABNORMAL LOW (ref 135–145)

## 2018-04-15 LAB — GLUCOSE, CAPILLARY
Glucose-Capillary: 136 mg/dL — ABNORMAL HIGH (ref 70–99)
Glucose-Capillary: 201 mg/dL — ABNORMAL HIGH (ref 70–99)
Glucose-Capillary: 215 mg/dL — ABNORMAL HIGH (ref 70–99)
Glucose-Capillary: 269 mg/dL — ABNORMAL HIGH (ref 70–99)

## 2018-04-15 LAB — VANCOMYCIN, RANDOM: Vancomycin Rm: 17

## 2018-04-15 MED ORDER — VANCOMYCIN HCL 10 G IV SOLR
1500.0000 mg | INTRAVENOUS | Status: DC
  Filled 2018-04-15 (×2): qty 1500

## 2018-04-15 MED ORDER — VANCOMYCIN HCL 10 G IV SOLR
1250.0000 mg | INTRAVENOUS | Status: DC
  Administered 2018-04-15 – 2018-04-16 (×2): 1250 mg via INTRAVENOUS
  Filled 2018-04-15 (×3): qty 1250

## 2018-04-15 NOTE — Progress Notes (Addendum)
Pharmacy Antibiotic Note  [name] is a 43 y.o. male admitted on 04/12/2018 with cellulitis.  Pharmacy has been consulted for Vancomycin dosing.  Serum creatinine on admission 3.24> 2.83 > 2.57>2.45. Patient received 2000mg  IV every 24 hours x2 doses. Vancomycin random level approximately 18 hours after the last dose came back at 27 mcg/ml.  Vancomycin random level approximately 34 hours after last dose came back at 17 mcg/mL  Calculated Ke  0.029 with t1/2 at 24 hours.  Good UOP recorded at 0.7 ml/kg/hr.  Normalized CrCl ~ 40 mL/min.   Plan: Restart Vancomycin 1250mg  IV every 24 hours at 10 AM today.  (For predicted trough ~11 mcg/mL meeting goal of 10-15 mcg/mL for cellulitis) Monitor SCr and UOP - may need to check levels early to prevent accumulation if to continue vancomycin therapy.   Height: 5\' 8"  (172.7 cm) Weight: (!) 365 lb 11.9 oz (165.9 kg) IBW/kg (Calculated) : 68.4  Temp (24hrs), Avg:98.2 F (36.8 C), Min:97.7 F (36.5 C), Max:98.6 F (37 C)  Recent Labs  Lab 04/12/18 1614 04/13/18 0200 04/14/18 0225 04/14/18 1206 04/15/18 0351  WBC 12.5* 11.0* 10.7*  --  11.9*  CREATININE 3.24* 2.83* 2.57*  --  2.45*  VANCORANDOM  --   --   --  27 17    Estimated Creatinine Clearance: 60.3 mL/min (A) (by C-G formula based on SCr of 2.45 mg/dL (H)).    Allergies  Allergen Reactions  . Reglan [Metoclopramide] Other (See Comments)    Dysphoric reaction    Antimicrobials this admission: Vanc 4/3 >>   Microbiology results: 4/3 BCx: ngtd  Thank you for allowing pharmacy to be a part of this patient's care.  Sloan Leiter, PharmD, BCPS, BCCCP Clinical Pharmacist Please refer to [facility] for [place] numbers 04/15/2018, 7:30 AM

## 2018-04-15 NOTE — Progress Notes (Signed)
Subjective: [name] states that he is doing a bit better today. He believes that the LLE pain and swelling has improved. He has a good appetite. He reported that he did want to go home but wanted to stay until everything was taken care of. All questions and concerns were addressed.   Objective:  Vital signs in last 24 hours: Vitals:   04/14/18 0722 04/14/18 1404 04/14/18 2127 04/15/18 0349  BP:  (!) 151/83 (!) 161/74 129/65  Pulse:  80 71 71  Resp:  16 18 17   Temp:  97.7 F (36.5 C) 98.4 F (36.9 C) 98.6 F (37 C)  TempSrc:  Oral Oral Oral  SpO2:  100% 95% 98%  Weight: (!) 165.9 kg     Height:        General: Morbidly obese male lying in bed in no acute distress Cardiac: Normal rate, regular rhythm Pulmonary: CTAB, normal WOB Abdomen: Obese abdomen, no TTP, soft Extremity: Right AKA with well healed wound, left lower extremity has about an 8x4cm area of induration and warm to touch, TTP to light touch, 2+ pitting edema. Psychiatry: Normal behavior, mood, and affect    Assessment/Plan:  Active Problems:   Cellulitis   Leg swelling   Pain  This is a 43 year old male with a history of CAD, hypertension, diastolic heart failure, type 2 diabetes, DVT and PE on Eliquis, CKD stage III, right AKA, and obstructive sleep apnea presented with persistent left lower extremity pain and edema after failing outpatient treatment of cellulitis with Keflex.  He was found to be afebrile but had a leukocytosis of 12.  He had lower extremity ultrasound that showed no abscess.  Patient was admitted for IV treatment of cellulitis  Cellulitis of left lower extremity: -Patient reports that he still having pain in his left leg but that the pain medications help.  He has remained afebrile, he has a small bump in his leukocytosis to 12 today from 11 yesterday.  On exam he still has an area of tenderness, warmth, and darkened area however it is improved from admission.  He is currently on day 4 of  vancomycin. -Continue vancomycin (day 4) -Blood cultures have shown NGTD -Trend fever curve and CBC -Continue Percocet as needed for pain -Continue Dilaudid 1 mg p.o. as needed for breakthrough pain -Patient will need ABIs when acute infection is resolved  Type 2 DM: -He is on insulin degludec 60 u daily, novolog 15u TID with meal, and victoza 1.2mg  daily at home.  CBGs have been well controlled while he has been admitted. -Continue lantus 45 units daily -Continue novolog 10 units TID AC -SSI -Frequent CBGs -Continue home gabapentin 100 mg TID  Hypertension: -Patient is on Coreg 25 mg twice daily, clonidine 0.3 mg twice daily, hydralazine 50 mg 4 times daily, Lasix 80 mg twice daily, metolazone 2.5 mg every other day.    Blood pressures have been elevated however the most recent one was normotensive, we will continue to monitor and if blood pressure increases will start hydralazine today. -Continue clonidine 0.3 mg BID -Continue Coreg 25 BID -Start hydralazine if he becomes hypertensive -Continue to monitor  Diastolic heart failure: -Patient is on Lasix 80 mg twice daily and metolazone 2.5 mg every other day.  Patient appears euvolemic on exam. -Continue home Lasix 80 mg twice daily -Hold home metolazone due to AKI  Possible AKI on CKD 3: -On admission creatinine was 3.2, baseline is variable from 2-3 so is unclear if this is  an acute increase.  Holding home metolazone at this time.  Creatinine today is 2.4, appears to be around his baseline. -Continue to hold metolazone -Continue home calcitriol -BMP in a.m.  Chronic normocytic anemia: Hemoglobin stable at baseline of 9. History of DVT and TR:ZNBVAPOL home eliquis BPH: Continue home flomax Overactive bladder: Continue home oxybutynin  HLD: Continue home lovastatin Gout:Continue home allopurinol  FEN: no fluids, replete lytes prn, HH diet  VTE ppx: Home eliquis Code Status: FULL    Dispo: Anticipated discharge in  approximately 1-2 day(s).   Asencion Noble, MD 04/15/2018, 6:55 AM Pager: [phone]

## 2018-04-15 NOTE — Progress Notes (Signed)
Internal Medicine Attending:   I saw and examined the patient. I reviewed the resident's note and I agree with the resident's findings and plan as documented in the resident's note. Continue IV vancomycin today, if continues to have clinical improvement possible discharge tomorrow with doxycycline for MRSA coverage (not covered in outpatient Abx courses)

## 2018-04-15 NOTE — Discharge Summary (Signed)
Name: [name] MRN: [id number] DOB: 05-12-1975 43 y.o. PCP: [name], DO  Date of Admission: 04/12/2018  3:34 PM Date of Discharge: 04/16/2018  5:09 PM Attending Physician: Lucious Groves, DO  Discharge Diagnosis: 1. LLE cellulitis 2. HTN 3. Hx of diastolic HF 4. CKD stage 3   Discharge Medications: Allergies as of 04/16/2018      Reactions   Reglan [metoclopramide] Other (See Comments)   Dysphoric reaction      Medication List    STOP taking these medications   cephALEXin 500 MG capsule Commonly known as:  KEFLEX     TAKE these medications   accu-chek softclix lancets Use to check blood sugars 5 times a day. Dx code:E11.9. Insulin dependent.   Accu-Chek Softclix Lancets lancets USE TO CHECK BLOOD SUGAR 5 TIMES DAILY. DX CODE E11.9.   allopurinol 100 MG tablet Commonly known as:  ZYLOPRIM TAKE 2 TABLETS BY MOUTH ONCE DAILY   Alpha-Lipoic Acid 200 MG Caps Take 1 capsule (200 mg total) by mouth daily.   apixaban 5 MG Tabs tablet Commonly known as:  Eliquis Take 1 tablet (5 mg total) by mouth 2 (two) times daily.   aspirin EC 81 MG tablet Take 162 mg by mouth as needed for mild pain (or headaches).   B-12 2500 MCG Subl Place 2,500 mcg under the tongue daily.   calcitRIOL 0.25 MCG capsule Commonly known as:  ROCALTROL Take 1 capsule by mouth once daily   calcitRIOL 0.5 MCG capsule Commonly known as:  ROCALTROL Take 0.5 mcg by mouth daily.   carvedilol 25 MG tablet Commonly known as:  COREG TAKE 1 TABLET BY MOUTH TWICE DAILY What changed:  when to take this   cloNIDine 0.3 MG tablet Commonly known as:  CATAPRES Take 1 tablet (0.3 mg total) by mouth 2 (two) times daily.   doxycycline 100 MG tablet Commonly known as:  VIBRA-TABS Take 1 tablet (100 mg total) by mouth 2 (two) times daily.   FISH OIL PO Take 1 capsule by mouth at bedtime.   furosemide 40 MG tablet Commonly known as:  LASIX Take 80 mg by mouth 2 (two) times daily.    gabapentin 100 MG capsule Commonly known as:  NEURONTIN Take 100 mg by mouth 3 (three) times daily. Take 1 tablet by mouth three times daily.   glucose blood test strip Commonly known as:  Accu-Chek Aviva Plus 1 each by Other route 3 (three) times daily. Use as instructed to check blood sugar 3 times daily. DX:E11.65   hydrALAZINE 50 MG tablet Commonly known as:  APRESOLINE Take 50 mg by mouth 4 (four) times daily.   insulin aspart 100 UNIT/ML injection Commonly known as:  novoLOG Inject 15 Units into the skin 3 (three) times daily before meals.   Insulin Degludec 200 UNIT/ML Sopn Inject 60 Units into the skin daily. What changed:  when to take this   lovastatin 20 MG tablet Commonly known as:  MEVACOR TAKE 1 TABLET BY MOUTH ONCE DAILY What changed:  when to take this   metolazone 2.5 MG tablet Commonly known as:  ZAROXOLYN Take 2.5 mg by mouth every other day.   oxybutynin 5 MG tablet Commonly known as:  DITROPAN Take 5 mg by mouth 3 (three) times daily.   oxyCODONE-acetaminophen 5-325 MG tablet Commonly known as:  PERCOCET/ROXICET Take 1 tablet by mouth every 6 (six) hours as needed for severe pain.   Pen Needles 3/16" 31G X 5 MM Misc Use  five times daily   Insulin Pen Needle 31G X 5 MM Misc Commonly known as:  B-D UF III MINI PEN NEEDLES USE  4 TIMES DAILY   promethazine 12.5 MG tablet Commonly known as:  PHENERGAN Take 1 tablet (12.5 mg total) by mouth every 8 (eight) hours as needed for nausea or vomiting.   tamsulosin 0.4 MG Caps capsule Commonly known as:  FLOMAX Take 0.4 mg by mouth at bedtime.   Victoza 18 MG/3ML Sopn Generic drug:  liraglutide Inject 1.2 mg into the skin daily.   zinc sulfate 220 (50 Zn) MG capsule Take 220 mg by mouth daily.       Disposition and follow-up:   [name] was discharged from [facility] in Stable condition.  At the hospital follow up visit please address:  1.  LLE cellulitis: Please  assess leg, he was discharged with 9 more days of antibiotics, if symptoms resolved you can stop antibiotics on follow up  AKI: Discharge Cr was 2.59, baseline around 2-3, can recheck labs to make sure this has been stable  2.  Labs / imaging needed at time of follow-up: CBC, BMP  3.  Pending labs/ test needing follow-up: None  Follow-up Appointments: Follow-up Information    Kalman Shan Ratliff, DO. Go to.   Specialty:  Internal Medicine Why:  Wednesday April 15 at 10:15  Contact information: [place] [address] [phone]           Hospital Course by problem list:  1. LLE cellulitis: This is a 43 year old male with a history of CAD, hypertension, diastolic heart failure, type 2 diabetes, DVT and PE on Eliquis, CKD stage III, right AKA, and OSA who presented with persistent left lower extremity pain and edema after failing outpatient treatment of cellulitis with Keflex.  He was found to be afebrile with a leukocytosis.  Left lower extremity ultrasound showed no abscess, moderate diffuse subcutaneous edema.  Patient was treated with IV vancomycin for 5 days. He had improvement in his pain and swelling in his left leg. He was transitioned to oral doxycycline to complete a total of 14 days. Picture in last progress note.   2. HTN: Patient is on Coreg 25 mg daily, clonidine 0.3 mg twice daily, hydralazine 50 mg 4 times daily, Lasix 80 mg twice daily, metolazone 2.5 mg every other day.  He was restarted on clonidine, Coreg and Lasix. Home medications were resumed on discharge.  3. Hx of diastolic HF: Patient was continued on his home Lasix 80 mg twice daily, his metolazone was held initially due to possible AKI.  Patient was doing well and we resumed metolazone every other day on discharge. 4. CKD stage 3: On admission creatinine was 3.2, is mildly elevated from his baseline however baseline is variable around 2-3. Cr on discharge was 2.59.  5.  Diabetes mellitus:  Patient is well controlled on Lantus 45, NovoLog 10 units 3 times daily and sliding scale insulin moderate.  Continued his home medications on discharge.  Discharge Vitals:   BP (!) 149/81 (BP Location: Left Arm) Comment: first numbers entered wrong.  Pulse 74   Temp 98.1 F (36.7 C) (Oral)   Resp 18   Ht 5\' 8"  (1.727 m)   Wt (!) 164.5 kg   SpO2 100%   BMI 55.14 kg/m   Pertinent Labs, Studies, and Procedures:  CBC Latest Ref Rng & Units 04/16/2018 04/15/2018 04/14/2018  WBC 4.0 - 10.5 K/uL 11.7(H) 11.9(H) 10.7(H)  Hemoglobin 13.0 - 17.0 g/dL 9.1(L) 9.0(L) 9.4(L)  Hematocrit 39.0 - 52.0 % 29.6(L) 28.6(L) 29.7(L)  Platelets 150 - 400 K/uL 352 342 346   BMP Latest Ref Rng & Units 04/16/2018 04/15/2018 04/14/2018  Glucose 70 - 99 mg/dL 177(H) 165(H) 194(H)  BUN 6 - 20 mg/dL 75(H) 74(H) 77(H)  Creatinine 0.61 - 1.24 mg/dL 2.59(H) 2.45(H) 2.57(H)  BUN/Creat Ratio 9 - 20 - - -  Sodium 135 - 145 mmol/L 135 133(L) 134(L)  Potassium 3.5 - 5.1 mmol/L 3.9 4.0 3.8  Chloride 98 - 111 mmol/L 98 98 99  CO2 22 - 32 mmol/L 29 24 27   Calcium 8.9 - 10.3 mg/dL 9.1 8.9 8.9     04/12/18 left lower extremity ultrasound: IMPRESSION: Moderate diffuse subcutaneous edema over the left lower leg. No focal mass or focal fluid collection in the area of concern.   Discharge Instructions: Discharge Instructions    Call MD for:  difficulty breathing, headache or visual disturbances   Complete by:  As directed    Call MD for:  hives   Complete by:  As directed    Call MD for:  persistant dizziness or light-headedness   Complete by:  As directed    Call MD for:  persistant nausea and vomiting   Complete by:  As directed    Call MD for:  redness, tenderness, or signs of infection (pain, swelling, redness, odor or green/yellow discharge around incision site)   Complete by:  As directed    Call MD for:  severe uncontrolled pain   Complete by:  As directed    Call MD for:  temperature >100.4   Complete by:  As  directed    Diet - low sodium heart healthy   Complete by:  As directed    Discharge instructions   Complete by:  As directed    [name],   It has been a pleasure working with you and we are glad you're feeling better. You were hospitalized for left lower extremity cellulitis (skin infection).   For your cellulitis, Start taking doxycycline for the next 9 days, this will complete a 14 day course of antibiotics Stop taking the Keflex For the pain you can take percocet every 6 hours as needed for severe pain  Please continue taking your other home medications  Please follow up with the clinic next week  If your symptoms worsen or you develop new symptoms, please seek medical help whether it is your primary care provider or emergency department.  If you have any questions about this hospitalization please call [phone].   Increase activity slowly   Complete by:  As directed       Signed: Asencion Noble, MD 04/18/2018, 12:24 PM   Pager: [phone]

## 2018-04-16 ENCOUNTER — Telehealth: Payer: Self-pay

## 2018-04-16 LAB — CBC
HCT: 29.6 % — ABNORMAL LOW (ref 39.0–52.0)
Hemoglobin: 9.1 g/dL — ABNORMAL LOW (ref 13.0–17.0)
MCH: 26.7 pg (ref 26.0–34.0)
MCHC: 30.7 g/dL (ref 30.0–36.0)
MCV: 86.8 fL (ref 80.0–100.0)
Platelets: 352 10*3/uL (ref 150–400)
RBC: 3.41 MIL/uL — ABNORMAL LOW (ref 4.22–5.81)
RDW: 14.4 % (ref 11.5–15.5)
WBC: 11.7 10*3/uL — ABNORMAL HIGH (ref 4.0–10.5)
nRBC: 0 % (ref 0.0–0.2)

## 2018-04-16 LAB — BASIC METABOLIC PANEL
Anion gap: 8 (ref 5–15)
BUN: 75 mg/dL — ABNORMAL HIGH (ref 6–20)
CO2: 29 mmol/L (ref 22–32)
Calcium: 9.1 mg/dL (ref 8.9–10.3)
Chloride: 98 mmol/L (ref 98–111)
Creatinine, Ser: 2.59 mg/dL — ABNORMAL HIGH (ref 0.61–1.24)
GFR calc Af Amer: 34 mL/min — ABNORMAL LOW (ref >60)
GFR calc non Af Amer: 29 mL/min — ABNORMAL LOW (ref >60)
Glucose, Bld: 177 mg/dL — ABNORMAL HIGH (ref 70–99)
Potassium: 3.9 mmol/L (ref 3.5–5.1)
Sodium: 135 mmol/L (ref 135–145)

## 2018-04-16 LAB — GLUCOSE, CAPILLARY
Glucose-Capillary: 157 mg/dL — ABNORMAL HIGH (ref 70–99)
Glucose-Capillary: 206 mg/dL — ABNORMAL HIGH (ref 70–99)
Glucose-Capillary: 189 mg/dL — ABNORMAL HIGH (ref 70–99)

## 2018-04-16 MED ORDER — DOXYCYCLINE HYCLATE 100 MG PO TABS: 100.0000 mg | ORAL_TABLET | Freq: Two times a day (BID) | ORAL | 0 refills | Status: DC

## 2018-04-16 MED ORDER — OXYCODONE-ACETAMINOPHEN 5-325 MG PO TABS: 1.0000 | ORAL_TABLET | Freq: Four times a day (QID) | ORAL | 0 refills | Status: DC | PRN

## 2018-04-16 NOTE — Progress Notes (Signed)
Internal Medicine Attending:   I saw and examined the patient. I reviewed the resident's note and I agree with the resident's findings and plan as documented in the resident's note.  Lower extremity swelling improved per patient report.  On my examination no increased warmth erythema or signs of worsening infection and he remains afebrile.  Point-of-care ultrasound of this acute cutaneous tissues of his left leg continues to show cobblestoning without any focal abscess.  I discussed with him that I think he is stable for discharge I would transition off IV vancomycin and to doxycycline for an additional 10 days to complete a 14-day course.  I discussed with him that if symptoms resolve quicker than that he may potentially be able to in the course sooner than 14 days.  We will arrange for an in person follow-up visit with our clinic.

## 2018-04-16 NOTE — Telephone Encounter (Signed)
Hospital TOC per Dr Sherry Ruffing, discharge 04/16/2018, appt 04/24/2018.

## 2018-04-16 NOTE — Discharge Instructions (Signed)
[name],   It has been a pleasure working with you and we are glad you're feeling better. You were hospitalized for left lower extremity cellulitis (skin infection).   For your cellulitis, Start taking doxycycline for the next 9 days, this will complete a 14 day course of antibiotics Stop taking the Keflex For the pain you can take percocet every 6 hours as needed for severe pain  Please continue taking your other home medications  Please follow up with the clinic next week  If your symptoms worsen or you develop new symptoms, please seek medical help whether it is your primary care provider or emergency department.  If you have any questions about this hospitalization please call [phone].  Information on my medicine - ELIQUIS (apixaban)  Why was Eliquis prescribed for you? Eliquis was prescribed for you to reduce the risk of forming blood clots that can cause a DVT (deep vein thrombosis) or PE (pulmonary embolism).  What do You need to know about Eliquis ? Take your Eliquis TWICE DAILY - one tablet in the morning and one tablet in the evening with or without food.  It would be best to take the doses about the same time each day.  If you have difficulty swallowing the tablet whole please discuss with your pharmacist how to take the medication safely.  Take Eliquis exactly as prescribed by your doctor and DO NOT stop taking Eliquis without talking to the doctor who prescribed the medication.  Stopping may increase your risk of developing a new clot.  Refill your prescription before you run out.  After discharge, you should have regular check-up appointments with your healthcare provider that is prescribing your Eliquis.  In the future your dose may need to be changed if your kidney function or weight changes by a significant amount or as you get older.  What do you do if you miss a dose? If you miss a dose, take it as soon as you remember on the same day and resume  taking twice daily.  Do not take more than one dose of ELIQUIS at the same time.  Important Safety Information A possible side effect of Eliquis is bleeding. You should call your healthcare provider right away if you experience any of the following: ? Bleeding from an injury or your nose that does not stop. ? Unusual colored urine (red or dark brown) or unusual colored stools (red or black). ? Unusual bruising for unknown reasons. ? A serious fall or if you hit your head (even if there is no bleeding).  Some medicines may interact with Eliquis and might increase your risk of bleeding or clotting while on Eliquis. To help avoid this, consult your healthcare provider or pharmacist prior to using any new prescription or non-prescription medications, including herbals, vitamins, non-steroidal anti-inflammatory drugs (NSAIDs) and supplements.  This website has more information on Eliquis (apixaban): [email or web address].

## 2018-04-16 NOTE — Progress Notes (Signed)
Patient discharged to home. Verbalizes understanding of all discharge instructions including discharge medications and follow up MD visits.

## 2018-04-16 NOTE — Progress Notes (Signed)
Subjective: He states that he continues to have LLE pain and soreness. He does believe that it is getting somewhat better. He also has had difficulty sleeping while in the hospital. He denies nausea, vomiting, SOB, and chest pain. We discussed plans for discharge today. He is amenable to this and all questions/concerns were addressed.   Objective:  Vital signs in last 24 hours: Vitals:   04/15/18 1252 04/15/18 2007 04/16/18 0437 04/16/18 0452  BP: (!) 117/91 (!) 155/74  (!) 152/79  Pulse: 72 76  78  Resp:  17  18  Temp: 98.3 F (36.8 C) 98.4 F (36.9 C)  98.3 F (36.8 C)  TempSrc: Oral Oral  Oral  SpO2: 97% 97%  99%  Weight:   (!) 164.5 kg   Height:        General: Morbidly obese male lying in bed in no acute distress. Cardiac: Regular rate and rhythm Pulmonary: CTAB, normal WOB Abdomen: Obese abdomen without TTP. Normoactive BSs Psychiatry: Normal behavior, mood, and affect MS: LLE with induration and warmth. It does appear to have improved from admission. Please see picture below. RLE: AKA     Assessment/Plan:  Active Problems:   Insulin dependent type 2 diabetes mellitus, controlled (HCC)   Morbid obesity (HCC)   Chronic renal insufficiency, stage 3 (moderate) (HCC)   Unilateral AKA, right (HCC)   Chronic diastolic heart failure (HCC)   Cellulitis   Leg swelling   Pain  This is a 43 year old male with a history of CAD, hypertension, diastolic heart failure, type 2 diabetes, DVT and PE on Eliquis, CKD stage III, right AKA, and obstructive sleep apnea presented with persistent left lower extremity pain and edema after failing outpatient treatment of cellulitis with Keflex. He was found to be afebrile but had a leukocytosis of 12. He had lower extremity ultrasound that showed no abscess. Patient was admitted for IV treatment of cellulitis  Cellulitis of left lower extremity: Patient reports that he is still having pain in his left leg but that the pain medications  help, he reports improvement with the IV antibiotics. He remains afebrile, leukocytosis mildly improved to 11.7 today. On exam his lower extremity has had improvement in the swelling and tenderness. This may be a MRSA infection given the response to vancomycin and him failing his outpatient regimen.  -Discontinue vancomycin (completed 5 days) -Start doxycycline, complete a total of a 14 day course -Blood cultures have NGTD -Continue percocert PRN for pain, will prescribe a short course for pain control -Discontinue dilaudid -Will need ABIs on follow up -Trend fever curve and CBC -Follow up in clinic next week  Type 2 DM: -He is on insulin degludec 60 u daily, novolog 15u TID with meal, and victoza 1.2mg  daily at home.  CBGs have been well controlled while he has been admitted. -Continue lantus 45 units daily -Continue novolog 10 units TID AC -SSI -Frequent CBGs -Continue home gabapentin 100 mg TID  Hypertension: -Patient is on Coreg 25 mg twice daily, clonidine 0.3 mg twice daily, hydralazine 50 mg 4 times daily, Lasix 80 mg twice daily, metolazone 2.5 mg every other day. BP started to increase overnight, most recent one was 152/79.  -Continueclonidine 0.3 mg BID -Continue Coreg 25BID -Restart home hydralazine -Continue to monitor  Diastolic heart failure: -Patient is on Lasix 80 mg twice daily and metolazone 2.5 mg every other day.  Patient appears euvolemic on exam. -Continue home Lasix 80 mg twice daily -Hold home metolazone due to AKI  Possible AKI on CKD 3: -On admission creatinine was 3.2, baseline is variablefrom 2-3 so is unclear if this is an acute increase. Holding home metolazone at this time.  Creatinine today is 2.59, appears to be around his baseline. -Continue to hold metolazone -Continue home calcitriol -BMP in a.m.  Chronic normocytic anemia:Hemoglobin stable  History of DVT and DV:VOHYWVPX home eliquis BPH: Continue home flomax Overactive bladder:  Continue home oxybutynin  HLD: Continue home lovastatin Gout:Continue home allopurinol  FEN:no fluids, replete lytes prn,HHdiet  VTE TGG:YIRS eliquis Code Status: FULL   Dispo: Anticipated discharge in approximately today.   Victor Noble, MD 04/16/2018, 6:40 AM Pager: [phone]

## 2018-04-16 NOTE — TOC Transition Note (Signed)
Transition of Care [facility]) - CM/SW Discharge Note   Patient Details  Name: [name] MRN: [id number] Date of Birth: May 09, 1975  [facility]) CM/SW Contact:  [name] Mt, [place] Phone Number: 04/16/2018, 10:30 AM   Clinical Narrative:    CSW spoke with pt at bedside, pt from home alone with support from family/significant other and an aide from [facility] that comes 7 days a week. Pt confirms Dr. Heber Pindall in internal medicine is his physician and he has no concerns getting to and from appointments etc as he uses [facility]. No concerns with financially obtaining medications for himself. Pt has a ride home, and has no further questions/concerns.   Expected Discharge Plan: Home/Self Care Barriers to Discharge: No Barriers Identified   Patient Goals and CMS Choice Patient states their goals for this hospitalization and ongoing recovery are:: to get home   Choice offered to / list presented to : Patient  Expected Discharge Plan and Services Expected Discharge Plan: Home/Self Care   Discharge Planning Services: NA   Living arrangements for the past 2 months: Apartment                          Prior Living Arrangements/Services Living arrangements for the past 2 months: Apartment Lives with:: Self Patient language and need for interpreter reviewed:: No Do you feel safe going back to the place where you live?: Yes      Need for Family Participation in Patient Care: No (Comment) Care giver support system in place?: Yes (comment)(aide and family members in town) Current home services: Biomedical scientist Criminal Activity/Legal Involvement Pertinent to Current Situation/Hospitalization: No - Comment as needed  Activities of Daily Living Home Assistive Devices/Equipment: Wheelchair ADL Screening (condition at time of admission) Patient's cognitive ability adequate to safely complete daily activities?: Yes Is the patient deaf or have difficulty hearing?: No Does the  patient have difficulty seeing, even when wearing glasses/contacts?: No Does the patient have difficulty concentrating, remembering, or making decisions?: No Patient able to express need for assistance with ADLs?: Yes Does the patient have difficulty dressing or bathing?: No Independently performs ADLs?: Yes (appropriate for developmental age) Does the patient have difficulty walking or climbing stairs?: Yes Weakness of Legs: None Weakness of Arms/Hands: None  Permission Sought/Granted Permission sought to share information with : Family Supports Permission granted to share information with : Yes, Verbal Permission Granted  Share Information with NAME: Margarita Mail  Permission granted to share info w AGENCY: [facility]   Permission granted to share info w Relationship: significant other  Permission granted to share info w Contact Information: [phone]  Emotional Assessment Appearance:: Appears stated age Attitude/Demeanor/Rapport: Engaged, Gracious Affect (typically observed): Accepting, Adaptable, Calm Orientation: : Oriented to Self, Oriented to Place, Oriented to  Time, Oriented to Situation Alcohol / Substance Use: Alcohol Use(occasionally) Psych Involvement: No (comment)  Admission diagnosis:  Leg swelling [M79.89] Pain [R52] Patient Active Problem List   Diagnosis Date Noted  . Leg swelling   . Pain   . Cellulitis 04/12/2018  . Pain of left lower extremity 03/20/2018  . OSA (obstructive sleep apnea) 02/28/2018  . Intractable nausea and vomiting 02/09/2018  . Chronic diastolic heart failure ([place]) 02/02/2018  . Healthcare maintenance 08/15/2017  . Erectile dysfunction 04/22/2017  . History of pulmonary embolus (PE) 06/09/2016  . Unilateral AKA, right ([place]) 12/26/2013  . Hyperlipidemia 02/19/2013  . S/P cardiac cath, 02/18/13, normal coronaries 02/19/2013  . Chronic  renal insufficiency, stage 3 (moderate) ([place]) 02/19/2013  . Hypertension 07/24/2011  . Long  term current use of anticoagulant therapy 07/19/2011  . History of DVT (deep vein thrombosis) 07/28/2009  . Morbid obesity ([place]) 10/26/2005  . Insulin dependent type 2 diabetes mellitus, controlled ([place]) 01/09/2002   PCP:  [name], DO Pharmacy:   [place], [place] - [address] [place] [address] Phone: [phone] Fax: [phone]     Social Determinants of Health (SDOH) Interventions    Readmission Risk Interventions   Readmission Risk Prevention Plan 04/16/2018  Transportation Screening Complete  PCP or Specialist Appt within 3-5 Days Complete  HRI or [place] Complete  Social Work Consult for [place] Planning/Counseling Complete  Palliative Care Screening Not Applicable  Medication Review Press photographer) Complete  Some recent data might be hidden      Final next level of care: Home/Self Care Barriers to Discharge: No Barriers Identified   Patient Goals and CMS Choice Patient states their goals for this hospitalization and ongoing recovery are:: to get home   Choice offered to / list presented to : Patient  Discharge Placement        Patient to be transferred to facility by: personal car Name of family member notified: pt a&ox4; will contact ride Patient and family notified of of transfer: 04/16/18  Discharge Plan and Services   Discharge Planning Services: NA                      Social Determinants of Health ([place]) Interventions     Readmission Risk Interventions Readmission Risk Prevention Plan 04/16/2018  Transportation Screening Complete  PCP or Specialist Appt within 3-5 Days Complete  HRI or Home Care Consult Complete  Social Work Consult for [place] Planning/Counseling Complete  Palliative Care Screening Not Applicable  Medication Review Press photographer) Complete  Some recent data might be hidden

## 2018-04-17 LAB — CULTURE, BLOOD (ROUTINE X 2)
Culture: NO GROWTH
Culture: NO GROWTH
Special Requests: ADEQUATE
Special Requests: ADEQUATE

## 2018-04-17 NOTE — Telephone Encounter (Signed)
Attempted to contact pt-no answer, message and contact info left on recorder.[name], Victor Sealey Cassady4/8/202010:28 AM

## 2018-04-18 NOTE — Telephone Encounter (Signed)
Transition Care Management Follow-up Telephone Call   Date discharged? 04/16/18   How have you been since you were released from the hospital? "I feel much better"   Do you understand why you were in the hospital? yes   Do you understand the discharge instructions? yes   Where were you discharged to? Home   Items Reviewed:  Medications reviewed: no-pt lying down and stated he had already had all is questions answered prior to discharge  Allergies reviewed: yes  Dietary changes reviewed: no  Referrals reviewed: yes   Functional Questionnaire:   Activities of Daily Living (ADLs):   He states they are independent in the following: ambulation, bathing and hygiene, feeding, continence, grooming and toileting States they require assistance with the following: dressing-needs help with pants when he has the prothesis on    Any transportation issues/concerns?: no   Any patient concerns? no   Confirmed importance and date/time of follow-up visits scheduled yes  Provider Appointment booked with [facility] on 4/15 @1015  and DrKumar 4/15 @ 1pm (same day) (pt   Confirmed with patient if condition begins to worsen call PCP or go to the ER.  Patient was given the office number and encouraged to call back with question or concerns.  : yes .

## 2018-04-22 ENCOUNTER — Telehealth: Payer: Self-pay | Admitting: Internal Medicine

## 2018-04-22 NOTE — Telephone Encounter (Signed)
Pt states he started abx 4/8, yesterday he started having N&V, no diarrhea. He would like something called in for the N&V

## 2018-04-22 NOTE — Telephone Encounter (Signed)
Pt would like to get some nausea medication [place], [place] - [address] ; pt contact [phone]

## 2018-04-23 ENCOUNTER — Other Ambulatory Visit: Payer: Self-pay | Admitting: Oncology

## 2018-04-23 MED ORDER — ONDANSETRON 4 MG PO TBDP: 4.0000 mg | ORAL_TABLET | Freq: Three times a day (TID) | ORAL | 0 refills | Status: DC | PRN

## 2018-04-23 NOTE — Telephone Encounter (Signed)
I will send in Rx for Zofran

## 2018-04-24 ENCOUNTER — Encounter: Payer: Self-pay | Admitting: Internal Medicine

## 2018-04-24 ENCOUNTER — Encounter: Payer: Self-pay | Admitting: Dietician

## 2018-04-24 ENCOUNTER — Ambulatory Visit (INDEPENDENT_AMBULATORY_CARE_PROVIDER_SITE_OTHER): Payer: Medicare Other | Admitting: Internal Medicine

## 2018-04-24 ENCOUNTER — Other Ambulatory Visit: Payer: Self-pay

## 2018-04-24 ENCOUNTER — Ambulatory Visit (INDEPENDENT_AMBULATORY_CARE_PROVIDER_SITE_OTHER): Payer: Medicare Other | Admitting: Dietician

## 2018-04-24 ENCOUNTER — Encounter (HOSPITAL_BASED_OUTPATIENT_CLINIC_OR_DEPARTMENT_OTHER): Payer: Medicare Other

## 2018-04-24 ENCOUNTER — Ambulatory Visit: Payer: Medicare Other | Admitting: Endocrinology

## 2018-04-24 VITALS — BP 139/63 | HR 79 | Temp 98.1°F | Ht 74.0 in

## 2018-04-24 DIAGNOSIS — Z794 Long term (current) use of insulin: Secondary | ICD-10-CM | POA: Diagnosis not present

## 2018-04-24 DIAGNOSIS — Z79899 Other long term (current) drug therapy: Secondary | ICD-10-CM

## 2018-04-24 DIAGNOSIS — E119 Type 2 diabetes mellitus without complications: Secondary | ICD-10-CM

## 2018-04-24 DIAGNOSIS — E118 Type 2 diabetes mellitus with unspecified complications: Secondary | ICD-10-CM

## 2018-04-24 DIAGNOSIS — Z89611 Acquired absence of right leg above knee: Secondary | ICD-10-CM | POA: Diagnosis not present

## 2018-04-24 DIAGNOSIS — L03116 Cellulitis of left lower limb: Secondary | ICD-10-CM | POA: Diagnosis not present

## 2018-04-24 NOTE — Progress Notes (Signed)
Documentation for Freestyle Libre Pro Continuous glucose monitoring Freestyle Libre Pro CGM sensor placed today. Patient was educated about wearing sensor, keeping food, activity and medication log and when to call office. Patient was educated about how to care for the sensor and not to have an MRI, CT or Diathermy while wearing the sensor. Follow up was arranged with the patient for 1 week and 2 weeks.   Mr. Foxworth would like his endocrinologist, Dr. Dwyane Dee,  to receive the information from the Continuous glucose monitor.   Lot #: [id number] Serial #: [id number] Expiration Date: 09/09/18  Debera Lat, RD 04/24/2018 11:32 AM.

## 2018-04-24 NOTE — Progress Notes (Signed)
CC: LLE cellulitis follow up appt  HPI:  Mr.Josel Musich is a 43 y.o. male with PMH below.  Today we will address LLE cellulitis follow up appt  Please see A&P for status of the patient's chronic medical conditions  Past Medical History:  Diagnosis Date  . Acute venous embolism and thrombosis of deep vessels of proximal lower extremity ([place]) 07/19/2011  . Anemia   . Chest pain, neg MI, normal coronaries by cath 02/18/2013  . CKD (chronic kidney disease) stage 3, GFR 30-59 ml/min (HCC) 02/19/2013  . Colon polyps   . Diabetic ulcer of right foot ([place])   . DVT (deep venous thrombosis) ([place]) 09/2002   patient reports additional DVTs in '06 & '11 (unconfirmed)  . GERD (gastroesophageal reflux disease)   . History of blood transfusion    "related to OR" (10/31/2016)  . Hyperlipidemia 02/19/2013  . Hypertension   . Nausea & vomiting    "constant for the last couple weeks" (10/31/2016)  . Nephrotic syndrome   . Obesity    BMI 44, weight 346 pounds 01/30/14  . OSA (obstructive sleep apnea) 02/28/2018   Mild obstructive sleep apnea with an AHI of 9.8/h but severe during REM sleep with an AHI of 33.8/h.  Oxygen saturations dropped to 86% and there was moderate snoring  . Prosthesis adjustment 08/17/2016  . Pulmonary embolism ([place]) 09/2002   treated with 6 months of warfarin  . Pyelonephritis 02/02/2018  . Type I diabetes mellitus ([place]) dx'd 2001   Review of Systems:  ROS: Pulmonary: pt denies increased work of breathing, shortness of breath,  Cardiac: pt denies palpitations, chest pain,  Abdominal: pt denies abdominal pain, nausea, vomiting, or diarrhea   Physical Exam:  Vitals:   04/24/18 0945  BP: 139/63  Pulse: 79  Temp: 98.1 F (36.7 C)  TempSrc: Oral  SpO2: 97%  Height: 6\' 2"  (1.88 m)   Cardiac: distant heart sounds but normal rate and rhythm, clear s1 and s2, no murmurs, rubs or gallops Pulmonary: distant breath sounds but CTAB, not in distress Abdominal: non distended  abdomen, soft and nontender Extremities: RAKA pt with prosthesis, Left leg still edematous but improved from prior exam, still warm on lateral leg.  No erythema Psych: Alert, conversant, in good spirits   Social History   Socioeconomic History  . Marital status: Single    Spouse name: Not on file  . Number of children: 0  . Years of education: 15.5  . Highest education level: Not on file  Occupational History  . Occupation: [employer]  . Occupation: Lexicographer: [employer]   Social Needs  . Financial resource strain: Not on file  . Food insecurity:    Worry: Not on file    Inability: Not on file  . Transportation needs:    Medical: Not on file    Non-medical: Not on file  Tobacco Use  . Smoking status: Never Smoker  . Smokeless tobacco: Never Used  Substance and Sexual Activity  . Alcohol use: Yes    Comment: 10/31/2016 "a few beers q 6 months or so"  . Drug use: No  . Sexual activity: Yes  Lifestyle  . Physical activity:    Days per week: Not on file    Minutes per session: Not on file  . Stress: Not on file  Relationships  . Social connections:    Talks on phone: Not on file    Gets together: Not on file  Attends religious service: Not on file    Active member of club or organization: Not on file    Attends meetings of clubs or organizations: Not on file    Relationship status: Not on file  . Intimate partner violence:    Fear of current or ex partner: Not on file    Emotionally abused: Not on file    Physically abused: Not on file    Forced sexual activity: Not on file  Other Topics Concern  . Not on file  Social History Narrative   Financial assistance approved for 100% discount at [facility] and has [facility] card per [employer]   09/01/2009.      Lives in [place] with mother and sister.             Family History  Problem Relation Age of Onset  . Heart disease Mother   . Diabetes Mother   . Diabetes Maternal Uncle   . Diabetes Cousin      Assessment & Plan:   See Encounters Tab for problem based charting.  Patient seen with Dr. Lynnae January

## 2018-04-24 NOTE — Patient Instructions (Addendum)
Mr Roker, please continue to weigh yourself daily.  At discharge from the hospital you were told to take your lasix at 80mg  twice daily lets do this dose for now and stop the metolazone for the time being.  We are getting your labs today and I will tell you based on your renal function whether we need to make any more adjustments.  I will let you know the results of your ABI (measures blood flow to your leg).  Continue your antibiotics until they are complete.  Continue to wear your compression stockings and elevate your leg, continue exercising.  We will place a continuous glucose monitor to help with controlling your diabetes.   Please come in for a follow up in one week.

## 2018-04-24 NOTE — Patient Instructions (Signed)
Please record the time, amount and what food drinks and activities you have while wearing the continuous glucose monitor (CGM).  Bring the folder with you to follow up appointments. If your monitor falls off, please place it in the bag provided in your folder and bring it back with you to your next appointment.   Do not have a CT or an MRI while wearing the CGM.   1 week visit has been set up with me and a doctor for the first of two CGM downloads.   You will also return in 2 weeks to have your second download and the CGM removed.  Debera Lat, RD 04/24/2018 10:51 AM3

## 2018-04-24 NOTE — Assessment & Plan Note (Addendum)
Leg doing well, looks better, moving more, elevating leg bought an elevation pillow.  Pain has now improved. Did not follow discharge paperwork, he continued with his nephrologist's diuretic recommendations.   Leg visibly much improved, edema has improved still very warm especially on the lateral leg.    -Continue abx finish course -Attempted ABI's today however leg still too edematous, may have to try arterial duplex study in the future if possible  -will repeat labs today, I expect leukocytosis will still be present but hopefully stable and overall improved.  A little concerned about renal function but hopefully this will also be stable.   -place CGM although A1C significantly improved, needs tight glucose control and monitoring for lows.

## 2018-04-25 DIAGNOSIS — N3944 Nocturnal enuresis: Secondary | ICD-10-CM | POA: Diagnosis not present

## 2018-04-25 LAB — CBC WITH DIFFERENTIAL/PLATELET
Basophils Absolute: 0 10*3/uL (ref 0.0–0.2)
Basos: 0 %
EOS (ABSOLUTE): 0.3 10*3/uL (ref 0.0–0.4)
Eos: 3 %
Hematocrit: 28.4 % — ABNORMAL LOW (ref 37.5–51.0)
Hemoglobin: 9.2 g/dL — ABNORMAL LOW (ref 13.0–17.7)
Immature Grans (Abs): 0 10*3/uL (ref 0.0–0.1)
Immature Granulocytes: 0 %
Lymphocytes Absolute: 1.7 10*3/uL (ref 0.7–3.1)
Lymphs: 16 %
MCH: 27.5 pg (ref 26.6–33.0)
MCHC: 32.4 g/dL (ref 31.5–35.7)
MCV: 85 fL (ref 79–97)
Monocytes Absolute: 0.8 10*3/uL (ref 0.1–0.9)
Monocytes: 7 %
Neutrophils Absolute: 7.7 10*3/uL — ABNORMAL HIGH (ref 1.4–7.0)
Neutrophils: 74 %
Platelets: 359 10*3/uL (ref 150–450)
RBC: 3.34 x10E6/uL — ABNORMAL LOW (ref 4.14–5.80)
RDW: 14.1 % (ref 11.6–15.4)
WBC: 10.6 10*3/uL (ref 3.4–10.8)

## 2018-04-25 LAB — BMP8+ANION GAP
Anion Gap: 18 mmol/L (ref 10.0–18.0)
BUN/Creatinine Ratio: 24 — ABNORMAL HIGH (ref 9–20)
BUN: 68 mg/dL — ABNORMAL HIGH (ref 6–24)
CO2: 22 mmol/L (ref 20–29)
Calcium: 9.1 mg/dL (ref 8.7–10.2)
Chloride: 93 mmol/L — ABNORMAL LOW (ref 96–106)
Creatinine, Ser: 2.88 mg/dL — ABNORMAL HIGH (ref 0.76–1.27)
GFR calc Af Amer: 30 mL/min/{1.73_m2} — ABNORMAL LOW (ref >59)
GFR calc non Af Amer: 26 mL/min/{1.73_m2} — ABNORMAL LOW (ref >59)
Glucose: 377 mg/dL — ABNORMAL HIGH (ref 65–99)
Potassium: 4.2 mmol/L (ref 3.5–5.2)
Sodium: 133 mmol/L — ABNORMAL LOW (ref 134–144)

## 2018-04-25 NOTE — Progress Notes (Signed)
Internal Medicine Clinic Attending  I saw and evaluated the patient.  I personally confirmed the key portions of the history and exam documented by Dr. Shan Levans and I reviewed pertinent patient test results.  The assessment, diagnosis, and plan were formulated together and I agree with the documentation in the resident's note.    The patient requested to speak to me since it has been sometime since we had last seen each other.  He wanted my opinion about the continuous glucose monitor.  We discussed that we needed to do everything we can o save his right lower extremity.  We discussed that his A1c may not be accurate and may be falsely low due to his renal insufficiency.  We discussed that he can only check his CBG at home only so many times a day.  He understood the reasoning behind our recommendation for CGM and agreed to proceed.  I discussed his need for CGM with [name] our diabetes educator who then took over the visit.

## 2018-04-26 ENCOUNTER — Telehealth: Payer: Self-pay | Admitting: Internal Medicine

## 2018-04-26 NOTE — Telephone Encounter (Signed)
left message with lab results and directions to continue holding metolazone, daily weights and monitoring his edema.

## 2018-04-30 DIAGNOSIS — N3944 Nocturnal enuresis: Secondary | ICD-10-CM | POA: Diagnosis not present

## 2018-04-30 DIAGNOSIS — N318 Other neuromuscular dysfunction of bladder: Secondary | ICD-10-CM | POA: Diagnosis not present

## 2018-04-30 DIAGNOSIS — R8271 Bacteriuria: Secondary | ICD-10-CM | POA: Diagnosis not present

## 2018-05-01 ENCOUNTER — Encounter: Payer: Self-pay | Admitting: Dietician

## 2018-05-01 ENCOUNTER — Other Ambulatory Visit: Payer: Self-pay

## 2018-05-01 ENCOUNTER — Ambulatory Visit: Payer: Medicare Other | Admitting: Dietician

## 2018-05-01 ENCOUNTER — Ambulatory Visit (INDEPENDENT_AMBULATORY_CARE_PROVIDER_SITE_OTHER): Payer: Medicare Other | Admitting: Internal Medicine

## 2018-05-01 VITALS — BP 129/60 | HR 82 | Temp 98.4°F | Ht 74.0 in

## 2018-05-01 DIAGNOSIS — Z79899 Other long term (current) drug therapy: Secondary | ICD-10-CM

## 2018-05-01 DIAGNOSIS — Z794 Long term (current) use of insulin: Secondary | ICD-10-CM | POA: Diagnosis not present

## 2018-05-01 DIAGNOSIS — N184 Chronic kidney disease, stage 4 (severe): Secondary | ICD-10-CM | POA: Diagnosis not present

## 2018-05-01 DIAGNOSIS — L03116 Cellulitis of left lower limb: Secondary | ICD-10-CM | POA: Diagnosis not present

## 2018-05-01 DIAGNOSIS — Z89611 Acquired absence of right leg above knee: Secondary | ICD-10-CM | POA: Diagnosis not present

## 2018-05-01 DIAGNOSIS — E1122 Type 2 diabetes mellitus with diabetic chronic kidney disease: Secondary | ICD-10-CM

## 2018-05-01 DIAGNOSIS — E119 Type 2 diabetes mellitus without complications: Secondary | ICD-10-CM

## 2018-05-01 LAB — GLUCOSE, CAPILLARY: Glucose-Capillary: 307 mg/dL — ABNORMAL HIGH (ref 70–99)

## 2018-05-01 MED ORDER — OXYCODONE-ACETAMINOPHEN 5-325 MG PO TABS: 1.0000 | ORAL_TABLET | Freq: Four times a day (QID) | ORAL | 0 refills | Status: DC | PRN

## 2018-05-01 MED ORDER — DOXYCYCLINE HYCLATE 100 MG PO TABS: 100.0000 mg | ORAL_TABLET | Freq: Two times a day (BID) | ORAL | 0 refills | Status: AC

## 2018-05-01 NOTE — Assessment & Plan Note (Signed)
HPI:  Presented with complaints of left leg pain. He was previously diagnosed with cellulitis and started on doxycycline. He completed his antibiotics last week and states that since that time his pain, erythema, and swelling have returned. He states that this feels very similar to his last episode of cellulitis. He began to have difficulties with cellulitis starting back in February. He is never had any abscess. His diabetes is well controlled. He states that the pain prevents him from being able to ambulate.  Physical exam. The patient's distal lateral left lower extremity is erythematous, tender to palpation, and significantly swollen. No subcutaneous gas palpated. No discrete fluctuating mass palpated.  A/P: - Recurrent cellulitis in the setting of significant LE edema. Has CKD Stage IV and is already on Lasix 160 mg QD - Restart Doxycycline 100 mg BID - Oxycodone for pain  - Formal ABIs ordered

## 2018-05-01 NOTE — Progress Notes (Signed)
CGM download 1 documentation: Patient's CGM sensor fell off after day 5. He did not want it replaced.  Reviewed the results with Mr. Gentle. The first two days most blood sugars were in target, the latter 3 days his blood sugars were mostly >180mg /dl. He reports he had nausea and some vomiting those days with no appetite so he did not take any of his diabetes medicines. He is unaware of the cause for his nausea, and says he stopped eating fatty foods and beef a while ago. He mostly eats chicken and ground [place]. He took [place] insulin and did not take Victoza during CGM wear as he has not restarted it yet. He had no questions or concerns today.  Debera Lat, RD 05/01/2018 12:19 PM.

## 2018-05-01 NOTE — Patient Instructions (Signed)
Thank you for allowing [place] to provide your care. Today we're not making any changes to your diabetes regimen. We are currently restarting the doxycycline 100 mg twice daily. Please continue this for the next 10 days. If your leg does not prove please do not hesitate to call the clinic.  Please come back in three months for a visit with her primary care doctor.

## 2018-05-01 NOTE — Progress Notes (Signed)
   CC: Left leg pain  HPI:  [name] is a 43 y.o. male with PMHx listed below presenting for left leg pain. Please see the A&P for the status of the patient's chronic medical problems.  Past Medical History:  Diagnosis Date  . Acute venous embolism and thrombosis of deep vessels of proximal lower extremity ([place]) 07/19/2011  . Anemia   . Chest pain, neg MI, normal coronaries by cath 02/18/2013  . CKD (chronic kidney disease) stage 3, GFR 30-59 ml/min (HCC) 02/19/2013  . Colon polyps   . Diabetic ulcer of right foot ([place])   . DVT (deep venous thrombosis) ([place]) 09/2002   patient reports additional DVTs in '06 & '11 (unconfirmed)  . GERD (gastroesophageal reflux disease)   . History of blood transfusion    "related to OR" (10/31/2016)  . Hyperlipidemia 02/19/2013  . Hypertension   . Nausea & vomiting    "constant for the last couple weeks" (10/31/2016)  . Nephrotic syndrome   . Obesity    BMI 44, weight 346 pounds 01/30/14  . OSA (obstructive sleep apnea) 02/28/2018   Mild obstructive sleep apnea with an AHI of 9.8/h but severe during REM sleep with an AHI of 33.8/h.  Oxygen saturations dropped to 86% and there was moderate snoring  . Prosthesis adjustment 08/17/2016  . Pulmonary embolism ([place]) 09/2002   treated with 6 months of warfarin  . Pyelonephritis 02/02/2018  . Type I diabetes mellitus ([place]) dx'd 2001   Review of Systems:  Performed and all others negative.  Physical Exam: Vitals:   05/01/18 1026  BP: 129/60  Pulse: 82  Temp: 98.4 F (36.9 C)  TempSrc: Oral  SpO2: 98%  Height: 6\' 2"  (1.88 m)   General: Obese male in no acute distress Extremities: Left leg erythema, tenderness, and swelling. Right above the knee amputation Neuro: Alert and oriented x 3  Assessment & Plan:   See Encounters Tab for problem based charting.  Patient discussed with Dr. Beryle Beams

## 2018-05-01 NOTE — Progress Notes (Signed)
Medicine attending: Medical history, presenting problems, physical findings, and medications, reviewed with resident physician Dr Ina Homes on the day of the patient visit and I concur with his evaluation and management plan. Suspected early recurrence of cellulitis coincident w completing recent IV & PO antibiotics. I agree w resuming doxycycline. Chronic leg edema. CKD4. Already on high dose diuretic.

## 2018-05-01 NOTE — Assessment & Plan Note (Signed)
HPI:  Patient presented after wearing his CBG monitor. He wore it for five days before it fell off. His average blood sugar during this time was 260. His initial days his average blood sugar was 163 and 184. He states that these are when he was taking his insulin however on the last three days he began to feel sick and did not take his insulin due to fears of becoming hypoglycemic. During these days his glucose was over 300. He follows with endocrinology for management of his diabetes.  A/P: - Appears to be well controlled when he is taking his insulin. No changes to DM regimen  - Continue to follow with endo

## 2018-05-06 ENCOUNTER — Encounter (HOSPITAL_BASED_OUTPATIENT_CLINIC_OR_DEPARTMENT_OTHER): Payer: Medicare Other

## 2018-05-07 ENCOUNTER — Other Ambulatory Visit: Payer: Self-pay

## 2018-05-07 ENCOUNTER — Encounter (HOSPITAL_COMMUNITY): Payer: Self-pay | Admitting: Emergency Medicine

## 2018-05-07 ENCOUNTER — Emergency Department (HOSPITAL_COMMUNITY)
Admission: EM | Admit: 2018-05-07 | Discharge: 2018-05-07 | Disposition: A | Discharge status: Home / Self Care | Payer: Medicare Other | Attending: Emergency Medicine | Admitting: Emergency Medicine

## 2018-05-07 DIAGNOSIS — I959 Hypotension, unspecified: Secondary | ICD-10-CM | POA: Diagnosis not present

## 2018-05-07 DIAGNOSIS — I129 Hypertensive chronic kidney disease with stage 1 through stage 4 chronic kidney disease, or unspecified chronic kidney disease: Secondary | ICD-10-CM | POA: Insufficient documentation

## 2018-05-07 DIAGNOSIS — N183 Chronic kidney disease, stage 3 (moderate): Secondary | ICD-10-CM | POA: Diagnosis not present

## 2018-05-07 DIAGNOSIS — Z86711 Personal history of pulmonary embolism: Secondary | ICD-10-CM | POA: Insufficient documentation

## 2018-05-07 DIAGNOSIS — Z7901 Long term (current) use of anticoagulants: Secondary | ICD-10-CM | POA: Diagnosis not present

## 2018-05-07 DIAGNOSIS — Z86718 Personal history of other venous thrombosis and embolism: Secondary | ICD-10-CM | POA: Insufficient documentation

## 2018-05-07 DIAGNOSIS — R1084 Generalized abdominal pain: Secondary | ICD-10-CM | POA: Diagnosis not present

## 2018-05-07 DIAGNOSIS — K2900 Acute gastritis without bleeding: Secondary | ICD-10-CM

## 2018-05-07 DIAGNOSIS — Z79899 Other long term (current) drug therapy: Secondary | ICD-10-CM | POA: Insufficient documentation

## 2018-05-07 DIAGNOSIS — Z7982 Long term (current) use of aspirin: Secondary | ICD-10-CM | POA: Diagnosis not present

## 2018-05-07 DIAGNOSIS — R1110 Vomiting, unspecified: Secondary | ICD-10-CM | POA: Diagnosis not present

## 2018-05-07 DIAGNOSIS — Z794 Long term (current) use of insulin: Secondary | ICD-10-CM | POA: Diagnosis not present

## 2018-05-07 DIAGNOSIS — R52 Pain, unspecified: Secondary | ICD-10-CM | POA: Diagnosis not present

## 2018-05-07 DIAGNOSIS — R7989 Other specified abnormal findings of blood chemistry: Secondary | ICD-10-CM

## 2018-05-07 DIAGNOSIS — R42 Dizziness and giddiness: Secondary | ICD-10-CM | POA: Diagnosis not present

## 2018-05-07 DIAGNOSIS — E1122 Type 2 diabetes mellitus with diabetic chronic kidney disease: Secondary | ICD-10-CM | POA: Diagnosis not present

## 2018-05-07 DIAGNOSIS — R1013 Epigastric pain: Secondary | ICD-10-CM | POA: Diagnosis not present

## 2018-05-07 DIAGNOSIS — I499 Cardiac arrhythmia, unspecified: Secondary | ICD-10-CM | POA: Diagnosis not present

## 2018-05-07 LAB — COMPREHENSIVE METABOLIC PANEL
ALT: 18 U/L (ref 0–44)
AST: 23 U/L (ref 15–41)
Albumin: 3.1 g/dL — ABNORMAL LOW (ref 3.5–5.0)
Alkaline Phosphatase: 92 U/L (ref 38–126)
Anion gap: 17 — ABNORMAL HIGH (ref 5–15)
BUN: 86 mg/dL — ABNORMAL HIGH (ref 6–20)
CO2: 21 mmol/L — ABNORMAL LOW (ref 22–32)
Calcium: 9.5 mg/dL (ref 8.9–10.3)
Chloride: 96 mmol/L — ABNORMAL LOW (ref 98–111)
Creatinine, Ser: 3.51 mg/dL — ABNORMAL HIGH (ref 0.61–1.24)
GFR calc Af Amer: 24 mL/min — ABNORMAL LOW (ref >60)
GFR calc non Af Amer: 20 mL/min — ABNORMAL LOW (ref >60)
Glucose, Bld: 166 mg/dL — ABNORMAL HIGH (ref 70–99)
Potassium: 3.9 mmol/L (ref 3.5–5.1)
Sodium: 134 mmol/L — ABNORMAL LOW (ref 135–145)
Total Bilirubin: 0.6 mg/dL (ref 0.3–1.2)
Total Protein: 7.4 g/dL (ref 6.5–8.1)

## 2018-05-07 LAB — CBC WITH DIFFERENTIAL/PLATELET
Abs Immature Granulocytes: 0.12 10*3/uL — ABNORMAL HIGH (ref 0.00–0.07)
Basophils Absolute: 0 10*3/uL (ref 0.0–0.1)
Basophils Relative: 0 %
Eosinophils Absolute: 0.3 10*3/uL (ref 0.0–0.5)
Eosinophils Relative: 3 %
HCT: 29.6 % — ABNORMAL LOW (ref 39.0–52.0)
Hemoglobin: 9.4 g/dL — ABNORMAL LOW (ref 13.0–17.0)
Immature Granulocytes: 1 %
Lymphocytes Relative: 15 %
Lymphs Abs: 1.8 10*3/uL (ref 0.7–4.0)
MCH: 27.3 pg (ref 26.0–34.0)
MCHC: 31.8 g/dL (ref 30.0–36.0)
MCV: 86 fL (ref 80.0–100.0)
Monocytes Absolute: 1 10*3/uL (ref 0.1–1.0)
Monocytes Relative: 9 %
Neutro Abs: 8.5 10*3/uL — ABNORMAL HIGH (ref 1.7–7.7)
Neutrophils Relative %: 72 %
Platelets: 355 10*3/uL (ref 150–400)
RBC: 3.44 MIL/uL — ABNORMAL LOW (ref 4.22–5.81)
RDW: 15.2 % (ref 11.5–15.5)
WBC: 11.9 10*3/uL — ABNORMAL HIGH (ref 4.0–10.5)
nRBC: 0 % (ref 0.0–0.2)

## 2018-05-07 LAB — CBG MONITORING, ED: Glucose-Capillary: 149 mg/dL — ABNORMAL HIGH (ref 70–99)

## 2018-05-07 LAB — LIPASE, BLOOD: Lipase: 48 U/L (ref 11–51)

## 2018-05-07 MED ORDER — PROMETHAZINE HCL 25 MG/ML IJ SOLN
12.5000 mg | Freq: Once | INTRAMUSCULAR | Status: AC
  Administered 2018-05-07: 12.5 mg via INTRAVENOUS
  Filled 2018-05-07: qty 1

## 2018-05-07 MED ORDER — ONDANSETRON HCL 4 MG/2ML IJ SOLN
4.0000 mg | Freq: Once | INTRAMUSCULAR | Status: AC
  Administered 2018-05-07: 4 mg via INTRAVENOUS
  Filled 2018-05-07: qty 2

## 2018-05-07 MED ORDER — FAMOTIDINE 20 MG PO TABS: 20.0000 mg | ORAL_TABLET | Freq: Two times a day (BID) | ORAL | 0 refills | Status: DC

## 2018-05-07 MED ORDER — SODIUM CHLORIDE 0.9 % IV BOLUS
500.0000 mL | Freq: Once | INTRAVENOUS | Status: AC
  Administered 2018-05-07: 500 mL via INTRAVENOUS

## 2018-05-07 NOTE — ED Provider Notes (Signed)
[facility] EMERGENCY DEPARTMENT Provider Note   CSN: [id number] Arrival date & time: 05/07/18  1239    History   Chief Complaint Chief Complaint  Patient presents with  . Emesis  . Weakness    HPI [name] is a 43 y.o. male.     HPI   43 year old male presents today with complaints of abdominal pain and vomiting.  Patient notes he was in his usual state of health this morning.  He was having a bagel when he started to have pain in his epigastric region with one episode of vomiting.  He denies any ongoing vomiting, notes he is nauseous has pain in his epigastric region, denies any lower abdominal pain fever or diarrhea.  Patient denies any abnormal exposures.  He reports that he has been on 2 different antibiotics recently, one for a infection on his leg, and 1 secondary to recent urologic procedure.  Past Medical History:  Diagnosis Date  . Acute venous embolism and thrombosis of deep vessels of proximal lower extremity ([place]) 07/19/2011  . Anemia   . Chest pain, neg MI, normal coronaries by cath 02/18/2013  . CKD (chronic kidney disease) stage 3, GFR 30-59 ml/min (H[facility]) 02/19/2013  . Colon polyps   . Diabetic ulcer of right foot ([place])   . DVT (deep venous thrombosis) ([place]) 09/2002   patient reports additional DVTs in '06 & '11 (unconfirmed)  . GERD (gastroesophageal reflux disease)   . History of blood transfusion    "related to OR" (10/31/2016)  . Hyperlipidemia 02/19/2013  . Hypertension   . Nausea & vomiting    "constant for the last couple weeks" (10/31/2016)  . Nephrotic syndrome   . Obesity    BMI 44, weight 346 pounds 01/30/14  . OSA (obstructive sleep apnea) 02/28/2018   Mild obstructive sleep apnea with an AHI of 9.8/h but severe during REM sleep with an AHI of 33.8/h.  Oxygen saturations dropped to 86% and there was moderate snoring  . Prosthesis adjustment 08/17/2016  . Pulmonary embolism ([place]) 09/2002   treated with 6 months of warfarin  .  Pyelonephritis 02/02/2018  . Type I diabetes mellitus ([place]) dx'd 2001    Patient Active Problem List   Diagnosis Date Noted  . Leg swelling   . Cellulitis 04/12/2018  . Pain of left lower extremity 03/20/2018  . OSA (obstructive sleep apnea) 02/28/2018  . Chronic diastolic heart failure ([place]) 02/02/2018  . Healthcare maintenance 08/15/2017  . Erectile dysfunction 04/22/2017  . History of pulmonary embolus (PE) 06/09/2016  . Unilateral AKA, right ([place]) 12/26/2013  . Hyperlipidemia 02/19/2013  . S/P cardiac cath, 02/18/13, normal coronaries 02/19/2013  . Chronic renal insufficiency, stage 3 (moderate) ([place]) 02/19/2013  . Hypertension 07/24/2011  . Long term current use of anticoagulant therapy 07/19/2011  . History of DVT (deep vein thrombosis) 07/28/2009  . Morbid obesity ([place]) 10/26/2005  . Insulin dependent type 2 diabetes mellitus, controlled ([place]) 01/09/2002    Past Surgical History:  Procedure Laterality Date  . AMPUTATION Right 12/22/2013   Procedure: AMPUTATION BELOW KNEE;  Surgeon: [name], MD;  Location: [facility];  Service: Orthopedics;  Laterality: Right;  . AMPUTATION Right 02/19/2014   Procedure: RIGHT ABOVE KNEE AMPUTATION ;  Surgeon: [name], MD;  Location: [place] [facility];  Service: Orthopedics;  Laterality: Right;  . CARDIAC CATHETERIZATION  02/18/2013   normal coronaries  . ESOPHAGOGASTRODUODENOSCOPY N/A 11/02/2016   Procedure: ESOPHAGOGASTRODUODENOSCOPY (EGD);  Surgeon: [name], MD;  Location[facility] ENDOSCOPY;  Service: Endoscopy;  Laterality: N/A;  . I&D EXTREMITY Right 12/14/2013   Procedure: IRRIGATION AND DEBRIDEMENT RIGHT FOOT;  Surgeon: [name], MD;  Location: [facility];  Service: Orthopedics;  Laterality: Right;  . INCISION AND DRAINAGE ABSCESS  2007; 2015   "back"  . INCISION AND DRAINAGE OF WOUND Right 12/22/2013   Procedure: I&D RIGHT BUTTOCK;  Surgeon: [name], MD;  Location: [facility];  Service: Orthopedics;  Laterality: Right;  . LEFT HEART  CATHETERIZATION WITH CORONARY ANGIOGRAM N/A 02/18/2013   Procedure: LEFT HEART CATHETERIZATION WITH CORONARY ANGIOGRAM;  Surgeon: [name], MD;  Location: [facility] CATH LAB;  Service: Cardiovascular;  Laterality: N/A;        Home Medications    Prior to Admission medications   Medication Sig Start Date End Date Taking? Authorizing Provider  ACCU-CHEK SOFTCLIX LANCETS lancets USE TO CHECK BLOOD SUGAR 5 TIMES DAILY. DX CODE E11.9. 10/25/17   Kalman Shan Ratliff, DO  allopurinol (ZYLOPRIM) 100 MG tablet TAKE 2 TABLETS BY MOUTH ONCE DAILY Patient taking differently: Take 200 mg by mouth daily.  09/03/17   Kalman Shan Ratliff, DO  Alpha-Lipoic Acid 200 MG CAPS Take 1 capsule (200 mg total) by mouth daily. 08/17/16   [name], DO  apixaban (ELIQUIS) 5 MG TABS tablet Take 1 tablet (5 mg total) by mouth 2 (two) times daily. 08/11/16   Axel Filler, MD  aspirin EC 81 MG tablet Take 162 mg by mouth as needed for mild pain (or headaches).     [provider]  calcitRIOL (ROCALTROL) 0.25 MCG capsule Take 1 capsule by mouth once daily Patient not taking: No sig reported 03/12/18   Kalman Shan Ratliff, DO  calcitRIOL (ROCALTROL) 0.5 MCG capsule Take 0.5 mcg by mouth daily. 04/10/18   [provider]  carvedilol (COREG) 25 MG tablet TAKE 1 TABLET BY MOUTH TWICE DAILY Patient taking differently: Take 25 mg by mouth 2 (two) times daily with a meal.  02/27/18   Hoffman, Elza Rafter, DO  cloNIDine (CATAPRES) 0.3 MG tablet Take 1 tablet (0.3 mg total) by mouth 2 (two) times daily. 08/14/17   Kalman Shan Ratliff, DO  Cyanocobalamin (B-12) 2500 MCG SUBL Place 2,500 mcg under the tongue daily.    [provider]  doxycycline (VIBRA-TABS) 100 MG tablet Take 1 tablet (100 mg total) by mouth 2 (two) times daily for 10 days. 05/01/18 05/11/18  Ina Homes, MD  famotidine (PEPCID) 20 MG tablet Take 1 tablet (20 mg total) by mouth 2 (two) times daily.  05/07/18   Ramel Tobon, Dellis Filbert, PA-C  furosemide (LASIX) 40 MG tablet Take 80 mg by mouth 2 (two) times daily. 02/22/18   [provider]  gabapentin (NEURONTIN) 100 MG capsule Take 100 mg by mouth 3 (three) times daily. Take 1 tablet by mouth three times daily.    [provider]  glucose blood (ACCU-CHEK AVIVA PLUS) test strip 1 each by Other route 3 (three) times daily. Use as instructed to check blood sugar 3 times daily. DX:E11.65 12/05/17   [name], MD  hydrALAZINE (APRESOLINE) 50 MG tablet Take 50 mg by mouth 4 (four) times daily.    [provider]  insulin aspart (NOVOLOG) 100 UNIT/ML injection Inject 15 Units into the skin 3 (three) times daily before meals.     [provider]  Insulin Degludec 200 UNIT/ML SOPN Inject 60 Units into the skin daily. Patient taking differently: Inject 60 Units into the skin daily before breakfast.  02/14/18   Mosetta Anis, MD  Insulin Pen Needle (B-D UF III MINI PEN NEEDLES) 31G X 5 MM MISC USE  4 TIMES DAILY 09/05/17   Kalman Shan Ratliff, DO  Insulin Pen Needle (PEN NEEDLES 3/16") 31G X 5 MM MISC Use five times daily 08/14/16   [name], DO  Lancet Devices [facility]) lancets Use to check blood sugars 5 times a day. Dx code:E11.9. Insulin dependent. 08/14/16   Kalman Shan Ratliff, DO  liraglutide (VICTOZA) 18 MG/3ML SOPN Inject 1.2 mg into the skin daily.    [provider]  lovastatin (MEVACOR) 20 MG tablet TAKE 1 TABLET BY MOUTH ONCE DAILY Patient taking differently: Take 20 mg by mouth daily at 6 PM.  09/05/17   Hoffman, Elza Rafter, DO  metolazone (ZAROXOLYN) 2.5 MG tablet Take 2.5 mg by mouth every other day. 03/30/18   [provider]  Omega-3 Fatty Acids (FISH OIL PO) Take 1 capsule by mouth at bedtime.    [provider]  ondansetron (ZOFRAN ODT) 4 MG disintegrating tablet Take 1 tablet (4 mg total) by mouth every 8 (eight) hours as needed for nausea or  vomiting. 04/23/18   Annia Belt, MD  oxybutynin (DITROPAN) 5 MG tablet Take 5 mg by mouth 3 (three) times daily.    [provider]  oxyCODONE-acetaminophen (PERCOCET/ROXICET) 5-325 MG tablet Take 1 tablet by mouth every 6 (six) hours as needed for severe pain. 05/01/18   Ina Homes, MD  promethazine (PHENERGAN) 12.5 MG tablet Take 1 tablet (12.5 mg total) by mouth every 8 (eight) hours as needed for nausea or vomiting. 06/10/17   Santos-Sanchez, Merlene Morse, MD  tamsulosin (FLOMAX) 0.4 MG CAPS capsule Take 0.4 mg by mouth at bedtime.     [provider]  zinc sulfate 220 (50 Zn) MG capsule Take 220 mg by mouth daily.    [provider]    Family History Family History  Problem Relation Age of Onset  . Heart disease Mother   . Diabetes Mother   . Diabetes Maternal Uncle   . Diabetes Cousin     Social History Social History   Tobacco Use  . Smoking status: Never Smoker  . Smokeless tobacco: Never Used  Substance Use Topics  . Alcohol use: Yes    Comment: 10/31/2016 "a few beers q 6 months or so"  . Drug use: No     Allergies   Reglan [metoclopramide]   Review of Systems Review of Systems  All other systems reviewed and are negative.    Physical Exam Updated Vital Signs BP (!) 142/76 (BP Location: Left Arm)   Pulse 70   Temp 98.3 F (36.8 C) (Oral)   Resp 18   Wt (!) 164.5 kg   SpO2 97%   BMI 46.56 kg/m   Physical Exam Vitals signs and nursing note reviewed.  Constitutional:      Appearance: He is well-developed.  HENT:     Head: Normocephalic and atraumatic.  Eyes:     General: No scleral icterus.       Right eye: No discharge.        Left eye: No discharge.     Conjunctiva/sclera: Conjunctivae normal.     Pupils: Pupils are equal, round, and reactive to light.  Neck:     Musculoskeletal: Normal range of motion.     Vascular: No JVD.     Trachea: No tracheal deviation.  Pulmonary:     Effort: Pulmonary effort  is  normal.     Breath sounds: No stridor.  Abdominal:     Comments: Minimal epigastric tenderness palpation, no significant right upper quadrant or remaining abdominal tenderness palpation  Neurological:     Mental Status: He is alert and oriented to person, place, and time.     Coordination: Coordination normal.  Psychiatric:        Behavior: Behavior normal.        Thought Content: Thought content normal.        Judgment: Judgment normal.      ED Treatments / Results  Labs (all labs ordered are listed, but only abnormal results are displayed) Labs Reviewed  CBC WITH DIFFERENTIAL/PLATELET - Abnormal; Notable for the following components:      Result Value   WBC 11.9 (*)    RBC 3.44 (*)    Hemoglobin 9.4 (*)    HCT 29.6 (*)    Neutro Abs 8.5 (*)    Abs Immature Granulocytes 0.12 (*)    All other components within normal limits  COMPREHENSIVE METABOLIC PANEL - Abnormal; Notable for the following components:   Sodium 134 (*)    Chloride 96 (*)    CO2 21 (*)    Glucose, Bld 166 (*)    BUN 86 (*)    Creatinine, Ser 3.51 (*)    Albumin 3.1 (*)    GFR calc non Af Amer 20 (*)    GFR calc Af Amer 24 (*)    Anion gap 17 (*)    All other components within normal limits  CBG MONITORING, ED - Abnormal; Notable for the following components:   Glucose-Capillary 149 (*)    All other components within normal limits  LIPASE, BLOOD    EKG None  Radiology No results found.  Procedures Procedures (including critical care time)  Medications Ordered in ED Medications  sodium chloride 0.9 % bolus 500 mL (0 mLs Intravenous Stopped 05/07/18 1447)  ondansetron (ZOFRAN) injection 4 mg (4 mg Intravenous Given 05/07/18 1259)  sodium chloride 0.9 % bolus 500 mL (0 mLs Intravenous Stopped 05/07/18 1620)  promethazine (PHENERGAN) injection 12.5 mg (12.5 mg Intravenous Given 05/07/18 1529)     Initial Impression / Assessment and Plan / ED Course  I have reviewed the triage vital signs and  the nursing notes.  Pertinent labs & imaging results that were available during my care of the patient were reviewed by me and considered in my medical decision making (see chart for details).        Labs:   Imaging:  Consults:  Therapeutics:  Discharge Meds:   Assessment/Plan: 43 year old male presents today with nausea and vomiting.  High suspicion for gastritis.  Patient is afebrile no vomiting while here.  He has minimal epigastric tenderness, no right upper quadrant tenderness.  Low suspicion for acute cholecystitis, no lower abdominal pain to indicate appendicitis.  She was given Zofran which improved his symptoms, he is hesitant to try p.o. challenge.  He will be given promethazine, p.o. challenged.  I do anticipate him being discharged with antiemetics, strict return precautions.  Patient is in agreement to today's plan.   Final Clinical Impressions(s) / ED Diagnoses   Final diagnoses:  Acute gastritis without hemorrhage, unspecified gastritis type  Elevated serum creatinine    ED Discharge Orders         Ordered    famotidine (PEPCID) 20 MG tablet  2 times daily     05/07/18 1520  Okey Regal, PA-C 05/08/18 1526    Maudie Flakes, MD 05/12/18 [phone]

## 2018-05-07 NOTE — ED Provider Notes (Signed)
Care assumed from [facility], PA-C at shift change with PO challenge pending.   In brief, this patient is a 43 y.o. M who presents for evaluation of nausea/vomiting.  No vomiting since being here in ED but does endorse nausea.  Please see note from previous provider for full history/physical exam.  PLAN: Patient given promethazine.  Plan to p.o. challenge.  Patient able tolerate p.o. without any difficulty, plan for discharge home.  MDM:  Patient able to tolerate PO without any difficulty. At this time, patient exhibits no emergent life-threatening condition that require further evaluation in ED or admission. Patient had ample opportunity for questions and discussion. All patient's questions were answered with full understanding.  Portions of this note were generated with [employer]. Dictation errors may occur despite best attempts at proofreading.    1. Acute gastritis without hemorrhage, unspecified gastritis type   2. Elevated serum creatinine      Desma Mcgregor 05/07/18 1753    Maudie Flakes, MD 05/12/18 [phone]

## 2018-05-07 NOTE — ED Notes (Signed)
Pt CBG was 149, notified Anna(RN)

## 2018-05-07 NOTE — ED Notes (Signed)
Patient verbalizes understanding of discharge instructions. Opportunity for questioning and answers were provided. Armband removed by staff, pt discharged from ED. Wheeled out to lobby  

## 2018-05-07 NOTE — ED Triage Notes (Signed)
Pt c/o RUQ pain after taking meds and vomiting around 1045. Also c/o lightheadedness and gen weakness. 80/58, given 556ml's NS en route. Recheck BP 100/62

## 2018-05-07 NOTE — Discharge Instructions (Signed)
Please read the attached information.  Please use medication as directed.  If you develop any new or worsening signs or symptoms return to the emergency room.  He is follow-up with your primary care provider for reevaluation inform him of your elevated creatinine today and need for repeat analysis.

## 2018-05-08 ENCOUNTER — Ambulatory Visit: Payer: Medicare Other | Admitting: Dietician

## 2018-05-08 ENCOUNTER — Telehealth: Payer: Self-pay | Admitting: Dietician

## 2018-05-08 NOTE — Telephone Encounter (Signed)
Left voicemail for return call Debera Lat, RD 05/08/2018 9:55 AM.

## 2018-05-09 DIAGNOSIS — N3000 Acute cystitis without hematuria: Secondary | ICD-10-CM | POA: Diagnosis not present

## 2018-05-14 NOTE — Telephone Encounter (Signed)
Mr. Lesko requested an appointment for Medical Nutrition Therapy for his diabetes. He was scheduled for 05/22/18

## 2018-05-17 ENCOUNTER — Encounter: Payer: Self-pay | Admitting: Endocrinology

## 2018-05-17 ENCOUNTER — Encounter: Payer: Medicare Other | Admitting: Endocrinology

## 2018-05-19 NOTE — Progress Notes (Signed)
This encounter was created in error - please disregard.

## 2018-05-22 ENCOUNTER — Ambulatory Visit: Payer: Medicare Other | Admitting: Dietician

## 2018-05-23 ENCOUNTER — Other Ambulatory Visit: Payer: Self-pay | Admitting: *Deleted

## 2018-05-23 NOTE — Telephone Encounter (Signed)
error 

## 2018-05-24 ENCOUNTER — Other Ambulatory Visit: Payer: Self-pay | Admitting: Internal Medicine

## 2018-05-29 ENCOUNTER — Encounter (HOSPITAL_BASED_OUTPATIENT_CLINIC_OR_DEPARTMENT_OTHER): Payer: Medicare Other

## 2018-05-31 ENCOUNTER — Other Ambulatory Visit: Payer: Self-pay

## 2018-05-31 ENCOUNTER — Encounter: Payer: Self-pay | Admitting: Endocrinology

## 2018-05-31 ENCOUNTER — Ambulatory Visit (INDEPENDENT_AMBULATORY_CARE_PROVIDER_SITE_OTHER): Payer: Medicare Other | Admitting: Endocrinology

## 2018-05-31 ENCOUNTER — Telehealth: Payer: Self-pay | Admitting: *Deleted

## 2018-05-31 DIAGNOSIS — E1165 Type 2 diabetes mellitus with hyperglycemia: Secondary | ICD-10-CM | POA: Diagnosis not present

## 2018-05-31 DIAGNOSIS — Z794 Long term (current) use of insulin: Secondary | ICD-10-CM | POA: Diagnosis not present

## 2018-05-31 NOTE — Progress Notes (Signed)
Patient ID: Halo Laski, male   DOB: 06-10-1975, 43 y.o.   MRN: [id number]          Reason for Appointment: Follow-up for Type 2 Diabetes   Today's office visit was provided via telemedicine using video technique Explained to the patient and the the limitations of evaluation and management by telemedicine and the availability of in person appointments.  The patient understood the limitations and agreed to proceed. Patient also understood that the telehealth visit is billable. . Location of the patient: Home . Location of the provider: Office Only the patient and myself were participating in the encounter   History of Present Illness:          Date of diagnosis of type 2 diabetes mellitus:  2001      His weight is about 400 pounds at the time of diagnosis He does not know what his initial blood sugars and circumstances were but he apparently was started on insulin initially He was not able to tolerate metformin because of diarrhea Subsequently has been on insulin only, level of control not available but his A1c was significantly high at 12.8 in 2015  Background history:    Recent history:   INSULIN regimen is:  60 units Tresiba, NovoLog 15-25 U tid     Non-insulin hypoglycemic drugs the patient is taking are: Victoza: Currently not taking  His A1c was last 5.6 done in 2/20  Current management, blood sugar patterns and problems identified:  Previously he was told to hold off on his Victoza because of significantly decreased appetite  More recently his appetite has been somewhat better but not consistent  He also thinks that he is at times eating a lot of carbohydrates now  Has not been able to go to the gym for exercise and also not doing much activity at home also  He has had a regular blood sugar monitoring and because of lack of glucose better he only started checking his sugars about 3 days ago  Blood sugars have been markedly increased since then and as high as 532;   Only labs available are from the emergency room when he had gastritis and blood sugar was 166  Appears that his FASTING blood sugars are well over 200 consistently now  He has done only 1 reading after supper which was last night and was 276 and this was even with taking 25 units of NovoLog  Otherwise not checking readings after meals  Currently mostly taking 15 units of NovoLog but since yesterday has gone up to 25  Despite high FASTING readings has not increase his [place] as yet       Side effects from medications have been: Diarrhea with metformin, vomiting with Ozempic  Compliance with the medical regimen: Variable Hypoglycemia:   As above  Glucose monitoring:  done <1 a day         Glucometer:   ACCU-CHEK Aviva Blood sugar readings from patient meter related by patient  FASTING 276-336 Nonfasting 210-532 with only 1 reading of 139 that was below 210 yesterday at midday  PREVIOUS readings:  PRE-MEAL Fasting Lunch Dinner Bedtime Overall  Glucose range:  121, 185  81-323  216, 52    Mean/median:      125   POST-MEAL PC Breakfast PC Lunch PC Dinner  Glucose range:  43-137    Mean/median:       Self-care:    Typical meal intake: Breakfast is egg whites, some oatmeal/grits and banana.  Usually  small portions of meat or other protein at lunch and dinner and only a small amount of sweet potato at lunch or carbohydrate              Dietician visit, most recent: Unknown               Exercise:  Previously was doing 2 sessions of 1 hour each at the gym with weights, resistance and some cardio,, recently none    Weight history:  Wt Readings from Last 3 Encounters:  05/07/18 (!) 362 lb 10.5 oz (164.5 kg)  04/16/18 (!) 362 lb 10.5 oz (164.5 kg)  04/02/18 (!) 392 lb 9.6 oz (178.1 kg)    Glycemic control:   Lab Results  Component Value Date   HGBA1C 5.6 02/20/2018   HGBA1C 8.7 (H) 09/17/2017   HGBA1C 6.2 06/05/2017   Lab Results  Component Value Date   MICROALBUR  160.61 (H) 08/14/2013   LDLCALC 91 06/05/2017   CREATININE 3.51 (H) 05/07/2018   Lab Results  Component Value Date   MICRALBCREAT 818.2 (H) 08/14/2013    Lab Results  Component Value Date   FRUCTOSAMINE 263 12/04/2017      Allergies as of 05/31/2018      Reactions   Reglan [metoclopramide] Other (See Comments)   Dysphoric reaction      Medication List       Accurate as of May 31, 2018 10:06 AM. If you have any questions, ask your nurse or doctor.        accu-chek softclix lancets Use to check blood sugars 5 times a day. Dx code:E11.9. Insulin dependent.   Accu-Chek Softclix Lancets lancets USE TO CHECK BLOOD SUGAR 5 TIMES DAILY. DX CODE E11.9.   allopurinol 100 MG tablet Commonly known as:  ZYLOPRIM TAKE 2 TABLETS BY MOUTH ONCE DAILY   Alpha-Lipoic Acid 200 MG Caps Take 1 capsule (200 mg total) by mouth daily.   apixaban 5 MG Tabs tablet Commonly known as:  Eliquis Take 1 tablet (5 mg total) by mouth 2 (two) times daily.   aspirin EC 81 MG tablet Take 81 mg by mouth as needed for mild pain (or headaches).   B-12 2500 MCG Subl Place 2,500 mcg under the tongue daily.   calcitRIOL 0.25 MCG capsule Commonly known as:  ROCALTROL Take 1 capsule by mouth once daily   carvedilol 25 MG tablet Commonly known as:  COREG Take 1 tablet by mouth twice daily   cloNIDine 0.3 MG tablet Commonly known as:  CATAPRES Take 1 tablet (0.3 mg total) by mouth 2 (two) times daily.   famotidine 20 MG tablet Commonly known as:  PEPCID Take 1 tablet (20 mg total) by mouth 2 (two) times daily.   FISH OIL PO Take 1 capsule by mouth at bedtime.   furosemide 40 MG tablet Commonly known as:  LASIX Take 80 mg by mouth 2 (two) times daily.   gabapentin 100 MG capsule Commonly known as:  NEURONTIN Take 100 mg by mouth 3 (three) times daily. Take 1 tablet by mouth three times daily.   glucose blood test strip Commonly known as:  Accu-Chek Aviva Plus 1 each by Other route 3  (three) times daily. Use as instructed to check blood sugar 3 times daily. DX:E11.65   hydrALAZINE 50 MG tablet Commonly known as:  APRESOLINE Take 50 mg by mouth 4 (four) times daily.   insulin aspart 100 UNIT/ML injection Commonly known as:  novoLOG Inject 15 Units into the skin  3 (three) times daily before meals.   Insulin Degludec 200 UNIT/ML Sopn Inject 60 Units into the skin daily. What changed:  when to take this   lovastatin 20 MG tablet Commonly known as:  MEVACOR TAKE 1 TABLET BY MOUTH ONCE DAILY What changed:  when to take this   metolazone 2.5 MG tablet Commonly known as:  ZAROXOLYN Take 2.5 mg by mouth every other day.   ondansetron 4 MG disintegrating tablet Commonly known as:  Zofran ODT Take 1 tablet (4 mg total) by mouth every 8 (eight) hours as needed for nausea or vomiting.   oxybutynin 5 MG tablet Commonly known as:  DITROPAN Take 5 mg by mouth 3 (three) times daily.   oxyCODONE-acetaminophen 5-325 MG tablet Commonly known as:  PERCOCET/ROXICET Take 1 tablet by mouth every 6 (six) hours as needed for severe pain.   Pen Needles 3/16" 31G X 5 MM Misc Use five times daily   Insulin Pen Needle 31G X 5 MM Misc Commonly known as:  B-D UF III MINI PEN NEEDLES USE  4 TIMES DAILY   tamsulosin 0.4 MG Caps capsule Commonly known as:  FLOMAX Take 0.4 mg by mouth at bedtime.   zinc sulfate 220 (50 Zn) MG capsule Take 220 mg by mouth daily.       Allergies:  Allergies  Allergen Reactions  . Reglan [Metoclopramide] Other (See Comments)    Dysphoric reaction    Past Medical History:  Diagnosis Date  . Acute venous embolism and thrombosis of deep vessels of proximal lower extremity ([place]) 07/19/2011  . Anemia   . Chest pain, neg MI, normal coronaries by cath 02/18/2013  . CKD (chronic kidney disease) stage 3, GFR 30-59 ml/min (HCC) 02/19/2013  . Colon polyps   . Diabetic ulcer of right foot ([place])   . DVT (deep venous thrombosis) ([place]) 09/2002    patient reports additional DVTs in '06 & '11 (unconfirmed)  . GERD (gastroesophageal reflux disease)   . History of blood transfusion    "related to OR" (10/31/2016)  . Hyperlipidemia 02/19/2013  . Hypertension   . Nausea & vomiting    "constant for the last couple weeks" (10/31/2016)  . Nephrotic syndrome   . Obesity    BMI 44, weight 346 pounds 01/30/14  . OSA (obstructive sleep apnea) 02/28/2018   Mild obstructive sleep apnea with an AHI of 9.8/h but severe during REM sleep with an AHI of 33.8/h.  Oxygen saturations dropped to 86% and there was moderate snoring  . Prosthesis adjustment 08/17/2016  . Pulmonary embolism ([place]) 09/2002   treated with 6 months of warfarin  . Pyelonephritis 02/02/2018  . Type I diabetes mellitus ([place]) dx'd 2001    Past Surgical History:  Procedure Laterality Date  . AMPUTATION Right 12/22/2013   Procedure: AMPUTATION BELOW KNEE;  Surgeon: [name], MD;  Location: [facility];  Service: Orthopedics;  Laterality: Right;  . AMPUTATION Right 02/19/2014   Procedure: RIGHT ABOVE KNEE AMPUTATION ;  Surgeon: [name], MD;  Location: [facility];  Service: Orthopedics;  Laterality: Right;  . CARDIAC CATHETERIZATION  02/18/2013   normal coronaries  . ESOPHAGOGASTRODUODENOSCOPY N/A 11/02/2016   Procedure: ESOPHAGOGASTRODUODENOSCOPY (EGD);  Surgeon: [name], MD;  Location[facility] ENDOSCOPY;  Service: Endoscopy;  Laterality: N/A;  . I&D EXTREMITY Right 12/14/2013   Procedure: IRRIGATION AND DEBRIDEMENT RIGHT FOOT;  Surgeon: [name], MD;  Location: [facility];  Service: Orthopedics;  Laterality: Right;  . INCISION AND DRAINAGE ABSCESS  2007;  2015   "back"  . INCISION AND DRAINAGE OF WOUND Right 12/22/2013   Procedure: I&D RIGHT BUTTOCK;  Surgeon: [name], MD;  Location: [place];  Service: Orthopedics;  Laterality: Right;  . LEFT HEART CATHETERIZATION WITH CORONARY ANGIOGRAM N/A 02/18/2013   Procedure: LEFT HEART CATHETERIZATION WITH CORONARY ANGIOGRAM;  Surgeon: Peter M  Martinique, MD;  Location: [facility] CATH LAB;  Service: Cardiovascular;  Laterality: N/A;    Family History  Problem Relation Age of Onset  . Heart disease Mother   . Diabetes Mother   . Diabetes Maternal Uncle   . Diabetes Cousin     Social History:  reports that he has never smoked. He has never used smokeless tobacco. He reports current alcohol use. He reports that he does not use drugs.   Review of Systems    Lipid history: On lovastatin with good control as follows:    Lab Results  Component Value Date   CHOL 150 06/05/2017   HDL 33.30 (L) 06/05/2017   LDLCALC 91 06/05/2017   TRIG 127.0 06/05/2017   CHOLHDL 4 06/05/2017           Hypertension: He is taking clonidine, Norvasc and Coreg followed by nephrologist  BP Readings from Last 3 Encounters:  05/07/18 (!) 142/76  05/01/18 129/60  04/24/18 139/63     Most recent eye exam was in 08/2017  Most recent foot exam: 1/77   Complications of diabetes: Nephropathy with renal insufficiency and proteinuria, unknown status of retinopathy, erectile dysfunction  Followed by nephrologist for CKD also Recently higher creatinine when he was having gastritis  Lab Results  Component Value Date   CREATININE 3.51 (H) 05/07/2018   CREATININE 2.88 (H) 04/24/2018   CREATININE 2.59 (H) 04/16/2018     LABS:  No visits with results within 1 Week(s) from this visit.  Latest known visit with results is:  Admission on 05/07/2018, Discharged on 05/07/2018  Component Date Value Ref Range Status  . Glucose-Capillary 05/07/2018 149* 70 - 99 mg/dL Final  . Comment 1 05/07/2018 Notify RN   Final  . Comment 2 05/07/2018 Document in Chart   Final  . WBC 05/07/2018 11.9* 4.0 - 10.5 K/uL Final  . RBC 05/07/2018 3.44* 4.22 - 5.81 MIL/uL Final  . Hemoglobin 05/07/2018 9.4* 13.0 - 17.0 g/dL Final  . HCT 05/07/2018 29.6* 39.0 - 52.0 % Final  . MCV 05/07/2018 86.0  80.0 - 100.0 fL Final  . MCH 05/07/2018 27.3  26.0 - 34.0 pg Final  . MCHC  05/07/2018 31.8  30.0 - 36.0 g/dL Final  . RDW 05/07/2018 15.2  11.5 - 15.5 % Final  . Platelets 05/07/2018 355  150 - 400 K/uL Final  . nRBC 05/07/2018 0.0  0.0 - 0.2 % Final  . Neutrophils Relative % 05/07/2018 72  % Final  . Neutro Abs 05/07/2018 8.5* 1.7 - 7.7 K/uL Final  . Lymphocytes Relative 05/07/2018 15  % Final  . Lymphs Abs 05/07/2018 1.8  0.7 - 4.0 K/uL Final  . Monocytes Relative 05/07/2018 9  % Final  . Monocytes Absolute 05/07/2018 1.0  0.1 - 1.0 K/uL Final  . Eosinophils Relative 05/07/2018 3  % Final  . Eosinophils Absolute 05/07/2018 0.3  0.0 - 0.5 K/uL Final  . Basophils Relative 05/07/2018 0  % Final  . Basophils Absolute 05/07/2018 0.0  0.0 - 0.1 K/uL Final  . Immature Granulocytes 05/07/2018 1  % Final  . Abs Immature Granulocytes 05/07/2018 0.12* 0.00 - 0.07 K/uL Final  Performed at [facility], [place] [address] [place], [place] [address]  . Sodium 05/07/2018 134* 135 - 145 mmol/L Final  . Potassium 05/07/2018 3.9  3.5 - 5.1 mmol/L Final   SLIGHT HEMOLYSIS  . Chloride 05/07/2018 96* 98 - 111 mmol/L Final  . CO2 05/07/2018 21* 22 - 32 mmol/L Final  . Glucose, Bld 05/07/2018 166* 70 - 99 mg/dL Final  . BUN 05/07/2018 86* 6 - 20 mg/dL Final  . Creatinine, Ser 05/07/2018 3.51* 0.61 - 1.24 mg/dL Final  . Calcium 05/07/2018 9.5  8.9 - 10.3 mg/dL Final  . Total Protein 05/07/2018 7.4  6.5 - 8.1 g/dL Final  . Albumin 05/07/2018 3.1* 3.5 - 5.0 g/dL Final  . AST 05/07/2018 23  15 - 41 U/L Final  . ALT 05/07/2018 18  0 - 44 U/L Final  . Alkaline Phosphatase 05/07/2018 92  38 - 126 U/L Final  . Total Bilirubin 05/07/2018 0.6  0.3 - 1.2 mg/dL Final  . GFR calc non Af Amer 05/07/2018 20* >60 mL/min Final  . GFR calc Af Amer 05/07/2018 24* >60 mL/min Final  . Anion gap 05/07/2018 17* 5 - 15 Final   Performed at [facility], [place] [address] [place], [place] [address]  . Lipase 05/07/2018 48  11 - 51 U/L Final   Performed at [facility]  [address], [place],  [address]    Physical Examination:  There were no vitals taken for this visit.      ASSESSMENT:  Diabetes type 2, with morbid obesity  See history of present illness for detailed discussion of current diabetes management, blood sugar patterns and problems identified  His last A1c was 5.6 about 3 months ago  Although his blood sugars are markedly increased recently not clear how long they have been high since he has not done much monitoring Not clear why he is having significant hypoglycemia but likely related to a combination of poor diet, lack of exercise, not taking Victoza  He has not had much motivation also especially with being locked down at home His appetite is starting to come back He is admitting to eating too many carbohydrates also    PLAN:   He will try Victoza 0.6 mg daily for now and if having no significant effect on appetite or any nausea he can go up to 1.2 in a couple of weeks Discussed needing to check blood sugars regularly May also consider freestyle libre if checking 4 times a day He will go up to 80 units of Tresiba starting today and go up 10 units weekly unless his blood sugars are down at least below 140 fasting He can stay on 25 NovoLog and may need to adjust further based on his portions and blood sugar levels  Will review his management in about 3 weeks again  There are no Patient Instructions on file for this visit.     [name] 05/31/2018, 10:06 AM   Note: This office note was prepared with Dragon voice recognition system technology. Any transcriptional errors that result from this process are unintentional.

## 2018-05-31 NOTE — Telephone Encounter (Signed)
Call from pt - he was informed by [facility] a prior auth is required for novolog flex pen. Informed I will ask Leonia Corona to f/u on Tuesday (closed Monday).

## 2018-06-04 ENCOUNTER — Telehealth: Payer: Self-pay | Admitting: *Deleted

## 2018-06-04 ENCOUNTER — Other Ambulatory Visit: Payer: Self-pay

## 2018-06-04 ENCOUNTER — Other Ambulatory Visit (HOSPITAL_COMMUNITY)
Admission: RE | Admit: 2018-06-04 | Discharge: 2018-06-04 | Disposition: A | Discharge status: Home / Self Care | Payer: Medicare Other | Source: Ambulatory Visit | Attending: Cardiology | Admitting: Cardiology

## 2018-06-04 ENCOUNTER — Ambulatory Visit (HOSPITAL_COMMUNITY)
Admission: RE | Admit: 2018-06-04 | Discharge: 2018-06-04 | Disposition: A | Discharge status: Home / Self Care | Payer: Medicare Other | Source: Ambulatory Visit | Attending: Oncology | Admitting: Oncology

## 2018-06-04 DIAGNOSIS — L03116 Cellulitis of left lower limb: Secondary | ICD-10-CM

## 2018-06-04 DIAGNOSIS — Z1159 Encounter for screening for other viral diseases: Secondary | ICD-10-CM | POA: Diagnosis not present

## 2018-06-04 DIAGNOSIS — Z01818 Encounter for other preprocedural examination: Secondary | ICD-10-CM | POA: Insufficient documentation

## 2018-06-04 LAB — SARS CORONAVIRUS 2 BY RT PCR (HOSPITAL ORDER, PERFORMED IN ~~LOC~~ HOSPITAL LAB): SARS Coronavirus 2: NEGATIVE

## 2018-06-04 NOTE — Telephone Encounter (Signed)
See  Sander Nephew telephone note from today.

## 2018-06-04 NOTE — Telephone Encounter (Signed)
Call to [facility] to ask about need for PA for [employer].  Pharmacy Tech said that patient's Insulin has been sitting  at the Pharmacy for 2 days ready for pick up at no charge to patient.  Patient was called and informed of.  Patient stated that he has Medicaid and [employer].Sander Nephew, RN 06/04/2018 10:56 AM.

## 2018-06-06 ENCOUNTER — Other Ambulatory Visit: Payer: Self-pay

## 2018-06-06 ENCOUNTER — Ambulatory Visit (HOSPITAL_BASED_OUTPATIENT_CLINIC_OR_DEPARTMENT_OTHER): Payer: Medicare Other | Attending: Cardiology | Admitting: Cardiology

## 2018-06-06 VITALS — Ht 74.0 in | Wt 364.0 lb

## 2018-06-06 DIAGNOSIS — G4733 Obstructive sleep apnea (adult) (pediatric): Secondary | ICD-10-CM | POA: Insufficient documentation

## 2018-06-07 ENCOUNTER — Telehealth: Payer: Self-pay | Admitting: Internal Medicine

## 2018-06-07 ENCOUNTER — Other Ambulatory Visit (HOSPITAL_BASED_OUTPATIENT_CLINIC_OR_DEPARTMENT_OTHER): Payer: Self-pay

## 2018-06-07 DIAGNOSIS — G4733 Obstructive sleep apnea (adult) (pediatric): Secondary | ICD-10-CM

## 2018-06-07 NOTE — Telephone Encounter (Signed)
Pt requesting his Vascular Study Results be sent to Dr. Jamal Maes @ [facility].  Fax # [phone]

## 2018-06-13 DIAGNOSIS — I129 Hypertensive chronic kidney disease with stage 1 through stage 4 chronic kidney disease, or unspecified chronic kidney disease: Secondary | ICD-10-CM | POA: Diagnosis not present

## 2018-06-13 DIAGNOSIS — R3915 Urgency of urination: Secondary | ICD-10-CM | POA: Diagnosis not present

## 2018-06-13 DIAGNOSIS — N183 Chronic kidney disease, stage 3 (moderate): Secondary | ICD-10-CM | POA: Diagnosis not present

## 2018-06-13 DIAGNOSIS — E1122 Type 2 diabetes mellitus with diabetic chronic kidney disease: Secondary | ICD-10-CM | POA: Diagnosis not present

## 2018-06-13 DIAGNOSIS — D631 Anemia in chronic kidney disease: Secondary | ICD-10-CM | POA: Diagnosis not present

## 2018-06-13 DIAGNOSIS — Z86718 Personal history of other venous thrombosis and embolism: Secondary | ICD-10-CM | POA: Diagnosis not present

## 2018-06-13 DIAGNOSIS — N2581 Secondary hyperparathyroidism of renal origin: Secondary | ICD-10-CM | POA: Diagnosis not present

## 2018-06-13 DIAGNOSIS — Z794 Long term (current) use of insulin: Secondary | ICD-10-CM | POA: Diagnosis not present

## 2018-06-13 DIAGNOSIS — G4730 Sleep apnea, unspecified: Secondary | ICD-10-CM | POA: Diagnosis not present

## 2018-06-13 DIAGNOSIS — H538 Other visual disturbances: Secondary | ICD-10-CM | POA: Diagnosis not present

## 2018-06-13 DIAGNOSIS — N189 Chronic kidney disease, unspecified: Secondary | ICD-10-CM | POA: Diagnosis not present

## 2018-06-13 DIAGNOSIS — N390 Urinary tract infection, site not specified: Secondary | ICD-10-CM | POA: Diagnosis not present

## 2018-06-14 DIAGNOSIS — H35031 Hypertensive retinopathy, right eye: Secondary | ICD-10-CM | POA: Diagnosis not present

## 2018-06-14 DIAGNOSIS — E113292 Type 2 diabetes mellitus with mild nonproliferative diabetic retinopathy without macular edema, left eye: Secondary | ICD-10-CM | POA: Diagnosis not present

## 2018-06-14 DIAGNOSIS — H35722 Serous detachment of retinal pigment epithelium, left eye: Secondary | ICD-10-CM | POA: Diagnosis not present

## 2018-06-14 DIAGNOSIS — H5319 Other subjective visual disturbances: Secondary | ICD-10-CM | POA: Diagnosis not present

## 2018-06-14 DIAGNOSIS — E113211 Type 2 diabetes mellitus with mild nonproliferative diabetic retinopathy with macular edema, right eye: Secondary | ICD-10-CM | POA: Diagnosis not present

## 2018-06-14 DIAGNOSIS — Q141 Congenital malformation of retina: Secondary | ICD-10-CM | POA: Diagnosis not present

## 2018-06-18 ENCOUNTER — Ambulatory Visit (HOSPITAL_COMMUNITY): Payer: Medicare Other

## 2018-06-18 ENCOUNTER — Encounter: Payer: Self-pay | Admitting: Internal Medicine

## 2018-06-18 ENCOUNTER — Other Ambulatory Visit: Payer: Self-pay

## 2018-06-18 ENCOUNTER — Ambulatory Visit (INDEPENDENT_AMBULATORY_CARE_PROVIDER_SITE_OTHER): Payer: Medicare Other | Admitting: Internal Medicine

## 2018-06-18 VITALS — BP 137/77 | HR 92 | Temp 98.8°F | Ht 74.0 in

## 2018-06-18 DIAGNOSIS — N189 Chronic kidney disease, unspecified: Secondary | ICD-10-CM | POA: Diagnosis not present

## 2018-06-18 DIAGNOSIS — H2513 Age-related nuclear cataract, bilateral: Secondary | ICD-10-CM | POA: Diagnosis not present

## 2018-06-18 DIAGNOSIS — G4452 New daily persistent headache (NDPH): Secondary | ICD-10-CM | POA: Diagnosis not present

## 2018-06-18 DIAGNOSIS — Z7901 Long term (current) use of anticoagulants: Secondary | ICD-10-CM | POA: Diagnosis not present

## 2018-06-18 DIAGNOSIS — E1122 Type 2 diabetes mellitus with diabetic chronic kidney disease: Secondary | ICD-10-CM | POA: Diagnosis not present

## 2018-06-18 DIAGNOSIS — E119 Type 2 diabetes mellitus without complications: Secondary | ICD-10-CM

## 2018-06-18 DIAGNOSIS — H52223 Regular astigmatism, bilateral: Secondary | ICD-10-CM | POA: Diagnosis not present

## 2018-06-18 DIAGNOSIS — R51 Headache: Secondary | ICD-10-CM

## 2018-06-18 DIAGNOSIS — H538 Other visual disturbances: Secondary | ICD-10-CM

## 2018-06-18 DIAGNOSIS — R519 Headache, unspecified: Secondary | ICD-10-CM | POA: Insufficient documentation

## 2018-06-18 DIAGNOSIS — Z794 Long term (current) use of insulin: Secondary | ICD-10-CM | POA: Diagnosis not present

## 2018-06-18 DIAGNOSIS — H524 Presbyopia: Secondary | ICD-10-CM | POA: Diagnosis not present

## 2018-06-18 DIAGNOSIS — H11133 Conjunctival pigmentations, bilateral: Secondary | ICD-10-CM | POA: Diagnosis not present

## 2018-06-18 DIAGNOSIS — H5211 Myopia, right eye: Secondary | ICD-10-CM | POA: Diagnosis not present

## 2018-06-18 LAB — GLUCOSE, CAPILLARY: Glucose-Capillary: 240 mg/dL — ABNORMAL HIGH (ref 70–99)

## 2018-06-18 LAB — POCT GLYCOSYLATED HEMOGLOBIN (HGB A1C): Hemoglobin A1C: 7.8 % — AB (ref 4.0–5.6)

## 2018-06-18 MED ORDER — SUMATRIPTAN SUCCINATE 50 MG PO TABS: 50.0000 mg | ORAL_TABLET | ORAL | 0 refills | Status: DC | PRN

## 2018-06-18 NOTE — Assessment & Plan Note (Signed)
Glucometer shows CBGs ranging from 180-300s. Denies symptomatic hypoglycemia. Will check a1c today and schedule PCP f/u

## 2018-06-18 NOTE — Progress Notes (Signed)
   CC: headache, blurry vision   HPI:  [name] is a 43 y.o.   One week of blurry vision and headache.  Headache: Started abruptly one week ago while at rest. Located in the temples and front of head, constant but with varying severity anywhere between a 4 and a 10. With associated blurry vision and sensitivity to light. Denies visual auras, n/v, numbness or weakness in extremities. Denies tearing or rhinorrhea associated with the headache. It is not positional. Denies previous episodes.  Exercise helps. Being outside in the sun makes it worse. He has tried ibuprofen, rest, and eye sleep mask. The sleep mask has helped the most. Denies excessive intact of caffeine, spicy food, or chocolate. Non-smoker. Social alcohol use. No other drug use besides his prescription   Saw his retinal specialist, no signs of diabetic eye disease or macular edema. Had his vision checked this morning and found out he needs glasses, right eye 20/30, left eye 20/40.   His insulin was increased but no other recent medication changes  FH of glaucoma but no FH of brain tumors or aneurysms   Past Medical History:  Diagnosis Date  . Acute venous embolism and thrombosis of deep vessels of proximal lower extremity ([place]) 07/19/2011  . Anemia   . Chest pain, neg MI, normal coronaries by cath 02/18/2013  . CKD (chronic kidney disease) stage 3, GFR 30-59 ml/min (HCC) 02/19/2013  . Colon polyps   . Diabetic ulcer of right foot ([place])   . DVT (deep venous thrombosis) ([place]) 09/2002   patient reports additional DVTs in '06 & '11 (unconfirmed)  . GERD (gastroesophageal reflux disease)   . History of blood transfusion    "related to OR" (10/31/2016)  . Hyperlipidemia 02/19/2013  . Hypertension   . Nausea & vomiting    "constant for the last couple weeks" (10/31/2016)  . Nephrotic syndrome   . Obesity    BMI 44, weight 346 pounds 01/30/14  . OSA (obstructive sleep apnea) 02/28/2018   Mild obstructive sleep apnea with  an AHI of 9.8/h but severe during REM sleep with an AHI of 33.8/h.  Oxygen saturations dropped to 86% and there was moderate snoring  . Prosthesis adjustment 08/17/2016  . Pulmonary embolism ([place]) 09/2002   treated with 6 months of warfarin  . Pyelonephritis 02/02/2018  . Type I diabetes mellitus ([place]) dx'd 2001    Physical Exam:  Vitals:   06/18/18 1117  BP: 137/77  Pulse: 92  SpO2: 100%  Height: 6\' 2"  (1.88 m)   Gen: well appearing obese male in wheelchair HENT: PERRLA, EOMI without pain, no eye redness, TTP over bilateral temples  Neuro: CN2-12 intact, sensation intact throughout, strength 5/5 in upper and lower extremities   Assessment & Plan:   See Encounters Tab for problem based charting.  Patient discussed with Dr. Daryll Drown

## 2018-06-18 NOTE — Patient Instructions (Addendum)
It was nice seeing you today. Thank you for choosing [facility] for your Primary Care.   Today we talked about:  1) Headache:  - it is most likely from your bad vision, get glasses and see if that helps  - I'd like to get blood work and a head CT just to make sure nothing else is going on   - I will call you with these results  - Pick up Sumatriptan from your pharmacy. Take this medication if the headache gets severe again and it should "abort" the headache. You can also continue taking ibuprofen.   2) Diabetes: we are checking your a1c today. Please make an appointment with your PCP  FOLLOW-UP INSTRUCTIONS When: next available appt with PCP 6/25 For: diabetes  What to bring:   Please contact the clinic if you have any problems, or need to be seen sooner.   Sumatriptan tablets What is this medicine? SUMATRIPTAN (soo ma TRIP tan) is used to treat migraines with or without aura. An aura is a strange feeling or visual disturbance that warns you of an attack. It is not used to prevent migraines. This medicine may be used for other purposes; ask your health care provider or pharmacist if you have questions. COMMON BRAND NAME(S): Imitrex, Migraine Pack What should I tell my health care provider before I take this medicine? They need to know if you have any of these conditions: -cigarette smoker -circulation problems in fingers and toes -diabetes -heart disease -high blood pressure -high cholesterol -history of irregular heartbeat -history of stroke -kidney disease -liver disease -stomach or intestine problems -an unusual or allergic reaction to sumatriptan, other medicines, foods, dyes, or preservatives -pregnant or trying to get pregnant -breast-feeding How should I use this medicine? Take this medicine by mouth with a glass of water. Follow the directions on the prescription label. Do not take it more often than directed. Talk to your pediatrician regarding the use of  this medicine in children. Special care may be needed. Overdosage: If you think you have taken too much of this medicine contact a poison control center or emergency room at once. NOTE: This medicine is only for you. Do not share this medicine with others. What if I miss a dose? This does not apply. This medicine is not for regular use. What may interact with this medicine? Do not take this medicine with any of the following medicines: -certain medicines for migraine headache like almotriptan, eletriptan, frovatriptan, naratriptan, rizatriptan, sumatriptan, zolmitriptan -ergot alkaloids like dihydroergotamine, ergonovine, ergotamine, methylergonovine -MAOIs like Carbex, Eldepryl, Marplan, Nardil, and Parnate This medicine may also interact with the following medications: -certain medicines for depression, anxiety, or psychotic disorders This list may not describe all possible interactions. Give your health care provider a list of all the medicines, herbs, non-prescription drugs, or dietary supplements you use. Also tell them if you smoke, drink alcohol, or use illegal drugs. Some items may interact with your medicine. What should I watch for while using this medicine? Visit your healthcare professional for regular checks on your progress. Tell your healthcare professional if your symptoms do not start to get better or if they get worse. You may get drowsy or dizzy. Do not drive, use machinery, or do anything that needs mental alertness until you know how this medicine affects you. Do not stand up or sit up quickly, especially if you are an older patient. This reduces the risk of dizzy or fainting spells. Alcohol may interfere with the effect  of this medicine. Tell your healthcare professional right away if you have any change in your eyesight. If you take migraine medicines for 10 or more days a month, your migraines may get worse. Keep a diary of headache days and medicine use. Contact your  healthcare professional if your migraine attacks occur more frequently. What side effects may I notice from receiving this medicine? Side effects that you should report to your doctor or health care professional as soon as possible: -allergic reactions like skin rash, itching or hives, swelling of the face, lips, or tongue -changes in vision -chest pain or chest tightness -signs and symptoms of a dangerous change in heartbeat or heart rhythm like chest pain; dizziness; fast, irregular heartbeat; palpitations; feeling faint or lightheaded; falls; breathing problems -signs and symptoms of a stroke like changes in vision; confusion; trouble speaking or understanding; severe headaches; sudden numbness or weakness of the face, arm or leg; trouble walking; dizziness; loss of balance or coordination -signs and symptoms of serotonin syndrome like irritable; confusion; diarrhea; fast or irregular heartbeat; muscle twitching; stiff muscles; trouble walking; sweating; high fever; seizures; chills; vomiting Side effects that usually do not require medical attention (report to your doctor or health care professional if they continue or are bothersome): -diarrhea -dizziness -drowsiness -dry mouth -headache -nausea, vomiting -pain, tingling, numbness in the hands or feet -stomach pain This list may not describe all possible side effects. Call your doctor for medical advice about side effects. You may report side effects to FDA at 1-800-FDA-1088. Where should I keep my medicine? Keep out of the reach of children. Store at room temperature between 2 and 30 degrees C (36 and 86 degrees F). Throw away any unused medicine after the expiration date. NOTE: This sheet is a summary. It may not cover all possible information. If you have questions about this medicine, talk to your doctor, pharmacist, or health care provider.  2019 Elsevier/Gold Standard (2017-07-10 15:05:37)

## 2018-06-18 NOTE — Assessment & Plan Note (Signed)
Acute. Describes sudden onset severe headache one week ago with associated sudden onset blurry vision. He is compliant with apixaban. Denies head trauma. Symptoms have improved but not completely. Differential includes migraine headache, ruptured brain aneurysm, tension headache 2/2 decreased visual acuity, giant cell arteritis. Seen by retinal specialist and reports normal eye exam so less likely idiopathic intracranial HTN. Given abrupt severe onset of new headache and blood thinner use, will get head CT. Would prefer contrasted study but he has CKD. Less likely GCA given improved blurry vision and no vision loss but given temporal artery tenderness to palpation on exam, will check ESR.   - Instructed patient to get glasses, continue ibuprofen.  - stat head CT non contrast  - ESR - Rx sumatriptan prn severe headache

## 2018-06-19 ENCOUNTER — Ambulatory Visit (HOSPITAL_COMMUNITY)
Admission: RE | Admit: 2018-06-19 | Discharge: 2018-06-19 | Disposition: A | Discharge status: Home / Self Care | Payer: Medicare Other | Source: Ambulatory Visit | Attending: Internal Medicine | Admitting: Internal Medicine

## 2018-06-19 DIAGNOSIS — R51 Headache: Secondary | ICD-10-CM | POA: Diagnosis not present

## 2018-06-19 DIAGNOSIS — G4452 New daily persistent headache (NDPH): Secondary | ICD-10-CM | POA: Insufficient documentation

## 2018-06-19 LAB — SEDIMENTATION RATE: Sed Rate: 72 mm/hr — ABNORMAL HIGH (ref 0–15)

## 2018-06-21 ENCOUNTER — Other Ambulatory Visit: Payer: Medicare Other

## 2018-06-21 NOTE — Procedures (Signed)
   Patient Name: [name], [name] Date: 06/06/2018 Gender: Male D.O.B: 04-29-1975 Age (years): 3 Referring Provider: Fransico Him MD, ABSM Height (inches): 74 Interpreting Physician: Fransico Him MD, ABSM Weight (lbs): 364 RPSGT: Zadie Rhine BMI: 47 MRN: [id number] Neck Size: 19.50  CLINICAL INFORMATION The patient is referred for a CPAP titration to treat sleep apnea.  SLEEP STUDY TECHNIQUE As per the AASM Manual for the Scoring of Sleep and Associated Events v2.3 (April 2016) with a hypopnea requiring 4% desaturations.  The channels recorded and monitored were frontal, central and occipital EEG, electrooculogram (EOG), submentalis EMG (chin), nasal and oral airflow, thoracic and abdominal wall motion, anterior tibialis EMG, snore microphone, electrocardiogram, and pulse oximetry. Continuous positive airway pressure (CPAP) was initiated at the beginning of the study and titrated to treat sleep-disordered breathing.  MEDICATIONS Medications self-administered by patient taken the night of the study : APRESOLINE, NOVOLOG, LOVASTATIN, OXYBUTYNIN  TECHNICIAN COMMENTS Comments added by technician: ONE RESTROOM VISTED Comments added by scorer: N/A  RESPIRATORY PARAMETERS Optimal PAP Pressure (cm): 16  AHI at Optimal Pressure (/hr):0.5 Overall Minimal O2 (%):79.0  Supine % at Optimal Pressure (%):9 Minimal O2 at Optimal Pressure (%): 83.0   SLEEP ARCHITECTURE The study was initiated at 11:15:41 PM and ended at 5:14:10 AM.  Sleep onset time was 4.4 minutes and the sleep efficiency was 93.6%%. The total sleep time was 335.5 minutes.  The patient spent 1.0%% of the night in stage N1 sleep, 54.8%% in stage N2 sleep, 0.7%% in stage N3 and 43.4% in REM.Stage REM latency was 2.5 minutes  Wake after sleep onset was 18.6. Alpha intrusion was absent. Supine sleep was 70.34%.  CARDIAC DATA The 2 lead EKG demonstrated sinus rhythm. The mean heart rate was 93.8 beats per minute. Other  EKG findings include: None.  LEG MOVEMENT DATA The total Periodic Limb Movements of Sleep (PLMS) were 0. The PLMS index was 0.0. A PLMS index of <15 is considered normal in adults.  IMPRESSIONS - The optimal PAP pressure was 16 cm of water. - Central sleep apnea was not noted during this titration (CAI = 0.2/h). - Severe oxygen desaturations were observed during this titration (min O2 = 79.0%). - No snoring was audible during this study. - No cardiac abnormalities were observed during this study. - Clinically significant periodic limb movements were not noted during this study. Arousals associated with PLMs were rare.  DIAGNOSIS - Obstructive Sleep Apnea (327.23 [G47.33 ICD-10])  RECOMMENDATIONS - Trial of CPAP therapy on 16 cm H2O with a Medium size Fisher&Paykel Full Face Mask Simplus mask and heated humidification. - Avoid alcohol, sedatives and other CNS depressants that may worsen sleep apnea and disrupt normal sleep architecture. - Sleep hygiene should be reviewed to assess factors that may improve sleep quality. - Weight management and regular exercise should be initiated or continued. - Return to Sleep Center for re-evaluation after 10 weeks of therapy  [Electronically signed] 06/21/2018 08:58 AM  Fransico Him MD, ABSM Diplomate, American Board of Sleep Medicine

## 2018-06-21 NOTE — Progress Notes (Signed)
Internal Medicine Clinic Attending  Case discussed with Dr. Donne Hazel at the time of the visit.  We reviewed the resident's history and exam and pertinent patient test results.  I agree with the assessment, diagnosis, and plan of care documented in the resident's note.    Head CT has resulted, no acute findings to explain headache.

## 2018-06-25 ENCOUNTER — Other Ambulatory Visit: Payer: Self-pay

## 2018-06-25 ENCOUNTER — Ambulatory Visit (INDEPENDENT_AMBULATORY_CARE_PROVIDER_SITE_OTHER): Payer: Medicare Other | Admitting: Endocrinology

## 2018-06-25 ENCOUNTER — Encounter: Payer: Self-pay | Admitting: Endocrinology

## 2018-06-25 DIAGNOSIS — Z794 Long term (current) use of insulin: Secondary | ICD-10-CM

## 2018-06-25 DIAGNOSIS — E1165 Type 2 diabetes mellitus with hyperglycemia: Secondary | ICD-10-CM | POA: Diagnosis not present

## 2018-06-25 NOTE — Progress Notes (Signed)
Patient ID: [name], male   DOB: 23-Oct-1975, 43 y.o.   MRN: [id number]          Reason for Appointment: Follow-up for Type 2 Diabetes   Today's office visit was provided via telemedicine using video technique Explained to the patient and the the limitations of evaluation and management by telemedicine and the availability of in person appointments.  The patient understood the limitations and agreed to proceed. Patient also understood that the telehealth visit is billable. . Location of the patient: Home . Location of the provider: Office Only the patient and myself were participating in the encounter   History of Present Illness:          Date of diagnosis of type 2 diabetes mellitus:  2001      His weight is about 400 pounds at the time of diagnosis He does not know what his initial blood sugars and circumstances were but he apparently was started on insulin initially He was not able to tolerate metformin because of diarrhea Subsequently has been on insulin only, level of control not available but his A1c was significantly high at 12.8 in 2015  Background history:    Recent history:   INSULIN regimen is:  80 units Tresiba, NovoLog 25 U tid     Non-insulin hypoglycemic drugs the patient is taking are: Victoza: 0.6 mg daily  His A1c was last 5.6 done in 2/20 and now 7.8  Current management, blood sugar patterns and problems identified:  On his last visit he was started back on his Victoza because of high postprandial readings and poor compliance with diet  However even with increasing his [place] by 20 units his blood sugars are overall mostly over 200  He says he is trying to work out at home in the mornings  Occasionally with this his blood sugar may be improved at about 97 recently but mostly blood sugars are still high  He still cannot consistently watch his diet and blood sugar was 369 this morning  He thinks he is taking his Victoza and insulin consistently and  no side effects with Victoza at this dose  Overall still checking his blood sugars somewhat infrequently  Blood sugars after dinner are appearing to be consistently over 200, has 3 recent readings       Side effects from medications have been: Diarrhea with metformin, vomiting with Ozempic  Compliance with the medical regimen: Variable Hypoglycemia:   As above  Glucose monitoring:  done <1 a day         Glucometer:   ACCU-CHEK Aviva Blood sugar readings from patient meter related by patient   PRE-MEAL Fasting Lunch Dinner Bedtime Overall  Glucose range:  369, 236  257  221  210-312   Mean/median:     ?   POST-MEAL PC Breakfast PC Lunch PC Dinner  Glucose range:  97-197    Mean/median:      Previously  FASTING 276-336 Nonfasting 210-532 with only 1 reading of 139 that was below 210 yesterday at midday     Self-care:    Typical meal intake: Breakfast is egg whites, some oatmeal/grits and banana.  Usually small portions of meat or other protein at lunch and dinner and only a small amount of sweet potato at lunch or carbohydrate              Dietician visit, most recent: Unknown               Exercise:  Previously was doing 2 sessions of 1 hour each at the gym with weights, resistance and some cardio,, recently less frequent and less intensive    Weight history:  Wt Readings from Last 3 Encounters:  06/06/18 (!) 364 lb (165.1 kg)  05/07/18 (!) 362 lb 10.5 oz (164.5 kg)  04/16/18 (!) 362 lb 10.5 oz (164.5 kg)    Glycemic control:   Lab Results  Component Value Date   HGBA1C 7.8 (A) 06/18/2018   HGBA1C 5.6 02/20/2018   HGBA1C 8.7 (H) 09/17/2017   Lab Results  Component Value Date   MICROALBUR 160.61 (H) 08/14/2013   LDLCALC 91 06/05/2017   CREATININE 3.51 (H) 05/07/2018   Lab Results  Component Value Date   MICRALBCREAT 818.2 (H) 08/14/2013    Lab Results  Component Value Date   FRUCTOSAMINE 263 12/04/2017      Allergies as of 06/25/2018       Reactions   Reglan [metoclopramide] Other (See Comments)   Dysphoric reaction      Medication List       Accurate as of June 25, 2018 11:40 AM. If you have any questions, ask your nurse or doctor.        accu-chek softclix lancets Use to check blood sugars 5 times a day. Dx code:E11.9. Insulin dependent.   Accu-Chek Softclix Lancets lancets USE TO CHECK BLOOD SUGAR 5 TIMES DAILY. DX CODE E11.9.   allopurinol 100 MG tablet Commonly known as: ZYLOPRIM TAKE 2 TABLETS BY MOUTH ONCE DAILY   Alpha-Lipoic Acid 200 MG Caps Take 1 capsule (200 mg total) by mouth daily.   apixaban 5 MG Tabs tablet Commonly known as: Eliquis Take 1 tablet (5 mg total) by mouth 2 (two) times daily.   aspirin EC 81 MG tablet Take 81 mg by mouth as needed for mild pain (or headaches).   B-12 2500 MCG Subl Place 2,500 mcg under the tongue daily.   calcitRIOL 0.25 MCG capsule Commonly known as: ROCALTROL Take 1 capsule by mouth once daily   carvedilol 25 MG tablet Commonly known as: COREG Take 1 tablet by mouth twice daily   cloNIDine 0.3 MG tablet Commonly known as: CATAPRES Take 1 tablet (0.3 mg total) by mouth 2 (two) times daily.   famotidine 20 MG tablet Commonly known as: PEPCID Take 1 tablet (20 mg total) by mouth 2 (two) times daily.   FISH OIL PO Take 1 capsule by mouth at bedtime.   furosemide 40 MG tablet Commonly known as: LASIX Take 80 mg by mouth 2 (two) times daily.   gabapentin 100 MG capsule Commonly known as: NEURONTIN Take 100 mg by mouth 3 (three) times daily. Take 1 tablet by mouth three times daily.   glucose blood test strip Commonly known as: Accu-Chek Aviva Plus 1 each by Other route 3 (three) times daily. Use as instructed to check blood sugar 3 times daily. DX:E11.65   hydrALAZINE 50 MG tablet Commonly known as: APRESOLINE Take 50 mg by mouth 4 (four) times daily.   insulin aspart 100 UNIT/ML injection Commonly known as: novoLOG Inject 25 Units into  the skin 3 (three) times daily before meals.   Insulin Degludec 200 UNIT/ML Sopn Inject 60 Units into the skin daily. What changed:   how much to take  when to take this   liraglutide 18 MG/3ML Sopn Commonly known as: VICTOZA Inject 1.2 mg into the skin.   lovastatin 20 MG tablet Commonly known as: MEVACOR TAKE 1 TABLET BY MOUTH ONCE  DAILY What changed: when to take this   metolazone 2.5 MG tablet Commonly known as: ZAROXOLYN Take 2.5 mg by mouth every other day.   ondansetron 4 MG disintegrating tablet Commonly known as: Zofran ODT Take 1 tablet (4 mg total) by mouth every 8 (eight) hours as needed for nausea or vomiting.   oxybutynin 5 MG tablet Commonly known as: DITROPAN Take 5 mg by mouth 3 (three) times daily.   oxyCODONE-acetaminophen 5-325 MG tablet Commonly known as: PERCOCET/ROXICET Take 1 tablet by mouth every 6 (six) hours as needed for severe pain.   Pen Needles 3/16" 31G X 5 MM Misc Use five times daily   Insulin Pen Needle 31G X 5 MM Misc Commonly known as: B-D UF III MINI PEN NEEDLES USE  4 TIMES DAILY   SUMAtriptan 50 MG tablet Commonly known as: IMITREX Take 1 tablet (50 mg total) by mouth every 2 (two) hours as needed for migraine. May repeat in 2 hours if headache persists or recurs.   tamsulosin 0.4 MG Caps capsule Commonly known as: FLOMAX Take 0.4 mg by mouth at bedtime.   zinc sulfate 220 (50 Zn) MG capsule Take 220 mg by mouth daily.       Allergies:  Allergies  Allergen Reactions  . Reglan [Metoclopramide] Other (See Comments)    Dysphoric reaction    Past Medical History:  Diagnosis Date  . Acute venous embolism and thrombosis of deep vessels of proximal lower extremity ([place]) 07/19/2011  . Anemia   . Chest pain, neg MI, normal coronaries by cath 02/18/2013  . CKD (chronic kidney disease) stage 3, GFR 30-59 ml/min ([facility]) 02/19/2013  . Colon polyps   . Diabetic ulcer of right foot ([place])   . DVT (deep venous thrombosis) ([place])  09/2002   patient reports additional DVTs in '06 & '11 (unconfirmed)  . GERD (gastroesophageal reflux disease)   . History of blood transfusion    "related to OR" (10/31/2016)  . Hyperlipidemia 02/19/2013  . Hypertension   . Nausea & vomiting    "constant for the last couple weeks" (10/31/2016)  . Nephrotic syndrome   . Obesity    BMI 44, weight 346 pounds 01/30/14  . OSA (obstructive sleep apnea) 02/28/2018   Mild obstructive sleep apnea with an AHI of 9.8/h but severe during REM sleep with an AHI of 33.8/h.  Oxygen saturations dropped to 86% and there was moderate snoring  . Prosthesis adjustment 08/17/2016  . Pulmonary embolism ([place]) 09/2002   treated with 6 months of warfarin  . Pyelonephritis 02/02/2018  . Type I diabetes mellitus ([place]) dx'd 2001    Past Surgical History:  Procedure Laterality Date  . AMPUTATION Right 12/22/2013   Procedure: AMPUTATION BELOW KNEE;  Surgeon: [name], MD;  Location: [place];  Service: Orthopedics;  Laterality: Right;  . AMPUTATION Right 02/19/2014   Procedure: RIGHT ABOVE KNEE AMPUTATION ;  Surgeon: [name], MD;  Location: [place];  Service: Orthopedics;  Laterality: Right;  . CARDIAC CATHETERIZATION  02/18/2013   normal coronaries  . ESOPHAGOGASTRODUODENOSCOPY N/A 11/02/2016   Procedure: ESOPHAGOGASTRODUODENOSCOPY (EGD);  Surgeon: [name], MD;  Location: [facility] ENDOSCOPY;  Service: Endoscopy;  Laterality: N/A;  . I&D EXTREMITY Right 12/14/2013   Procedure: IRRIGATION AND DEBRIDEMENT RIGHT FOOT;  Surgeon: [name], MD;  Location: [place];  Service: Orthopedics;  Laterality: Right;  . INCISION AND DRAINAGE ABSCESS  2007; 2015   "back"  . INCISION AND DRAINAGE OF WOUND Right 12/22/2013  Procedure: I&D RIGHT BUTTOCK;  Surgeon: [name], MD;  Location: [place];  Service: Orthopedics;  Laterality: Right;  . LEFT HEART CATHETERIZATION WITH CORONARY ANGIOGRAM N/A 02/18/2013   Procedure: LEFT HEART CATHETERIZATION WITH CORONARY ANGIOGRAM;   Surgeon: [name], MD;  [facility] C[facility] LAB;  Service: Cardiovascular;  Laterality: N/A;    Family History  Problem Relation Age of Onset  . Heart disease Mother   . Diabetes Mother   . Diabetes Maternal Uncle   . Diabetes Cousin     Social History:  reports that he has never smoked. He has never used smokeless tobacco. He reports current alcohol use. He reports that he does not use drugs.   Review of Systems    Lipid history: Has been on lovastatin with control as follows: Prescribed by PCP   Lab Results  Component Value Date   CHOL 150 06/05/2017   HDL 33.30 (L) 06/05/2017   LDLCALC 91 06/05/2017   TRIG 127.0 06/05/2017   CHOLHDL 4 06/05/2017           Hypertension: He is taking clonidine, Norvasc and Coreg followed by nephrologist  BP Readings from Last 3 Encounters:  06/18/18 137/77  05/07/18 (!) 142/76  05/01/18 129/60     Most recent eye exam was in 08/2017  Most recent foot exam: 3/90   Complications of diabetes: Nephropathy with renal insufficiency and proteinuria, unknown status of retinopathy, erectile dysfunction  Followed by nephrologist for CKD also Recently higher creatinine when he was having gastritis  Lab Results  Component Value Date   CREATININE 3.51 (H) 05/07/2018   CREATININE 2.88 (H) 04/24/2018   CREATININE 2.59 (H) 04/16/2018     LABS:  No visits with results within 1 Week(s) from this visit.  Latest known visit with results is:  Office Visit on 06/18/2018  Component Date Value Ref Range Status  . Hemoglobin A1C 06/18/2018 7.8* 4.0 - 5.6 % Final  . Sed Rate 06/18/2018 72* 0 - 15 mm/hr Final  . Glucose-Capillary 06/18/2018 240* 70 - 99 mg/dL Final    Physical Examination:  There were no vitals taken for this visit.      ASSESSMENT:  Diabetes type 2, with morbid obesity  See history of present illness for detailed discussion of current diabetes management, blood sugar patterns and problems identified  His last  A1c is back up to 7.8  His blood sugars are still not well controlled This is likely to be from continued insulin resistance, inadequate management of diet Also not able to exercise much Since he does need to have some weight loss he will benefit from continuing to increase Victoza which he is only taking 0.6 mg  He did not increase the dose as directed on the last visit  Discussed blood sugar targets, most of the readings are currently over 200   PLAN:   He will try Victoza 1.2 mg daily for now Increase Tresiba to 90 Increase suppertime NovoLog to 30 units Reduce morning NovoLog to 20 units if exercising More consistent monitoring at various times Continue to try and improve diet  Will review his management in about 4 weeks  There are no Patient Instructions on file for this visit.     [name] 06/25/2018, 11:40 AM   Note: This office note was prepared with Dragon voice recognition system technology. Any transcriptional errors that result from this process are unintentional.

## 2018-06-27 ENCOUNTER — Ambulatory Visit (HOSPITAL_COMMUNITY)
Admission: RE | Admit: 2018-06-27 | Discharge: 2018-06-27 | Disposition: A | Discharge status: Home / Self Care | Payer: Medicare Other | Source: Ambulatory Visit | Attending: Nephrology | Admitting: Nephrology

## 2018-06-27 ENCOUNTER — Telehealth: Payer: Self-pay | Admitting: *Deleted

## 2018-06-27 ENCOUNTER — Telehealth: Payer: Self-pay | Admitting: Internal Medicine

## 2018-06-27 ENCOUNTER — Ambulatory Visit (INDEPENDENT_AMBULATORY_CARE_PROVIDER_SITE_OTHER): Payer: Medicare Other | Admitting: Internal Medicine

## 2018-06-27 ENCOUNTER — Other Ambulatory Visit: Payer: Self-pay

## 2018-06-27 VITALS — BP 139/69 | HR 90 | Temp 97.3°F | Resp 18

## 2018-06-27 DIAGNOSIS — G4733 Obstructive sleep apnea (adult) (pediatric): Secondary | ICD-10-CM | POA: Diagnosis not present

## 2018-06-27 DIAGNOSIS — Z993 Dependence on wheelchair: Secondary | ICD-10-CM

## 2018-06-27 DIAGNOSIS — N183 Chronic kidney disease, stage 3 unspecified: Secondary | ICD-10-CM

## 2018-06-27 DIAGNOSIS — Z86718 Personal history of other venous thrombosis and embolism: Secondary | ICD-10-CM

## 2018-06-27 DIAGNOSIS — E1122 Type 2 diabetes mellitus with diabetic chronic kidney disease: Secondary | ICD-10-CM

## 2018-06-27 DIAGNOSIS — Z89611 Acquired absence of right leg above knee: Secondary | ICD-10-CM

## 2018-06-27 DIAGNOSIS — Z6841 Body Mass Index (BMI) 40.0 and over, adult: Secondary | ICD-10-CM

## 2018-06-27 LAB — POCT HEMOGLOBIN-HEMACUE: Hemoglobin: 10.1 g/dL — ABNORMAL LOW (ref 13.0–17.0)

## 2018-06-27 MED ORDER — DARBEPOETIN ALFA 100 MCG/0.5ML IJ SOSY
100.0000 ug | PREFILLED_SYRINGE | INTRAMUSCULAR | Status: DC
  Administered 2018-06-27: 100 ug via SUBCUTANEOUS

## 2018-06-27 MED ORDER — DARBEPOETIN ALFA 100 MCG/0.5ML IJ SOSY
PREFILLED_SYRINGE | INTRAMUSCULAR | Status: AC
  Administered 2018-06-27: 100 ug via SUBCUTANEOUS
  Filled 2018-06-27: qty 0.5

## 2018-06-27 NOTE — Progress Notes (Signed)
  [facility] Internal Medicine Residency Telephone Encounter Continuity Care Appointment  HPI:   This telephone encounter was created for [name] on 06/27/2018 for the following purpose/cc morbid obesity and wheelchair dependence.   Past Medical History:  Past Medical History:  Diagnosis Date  . Acute venous embolism and thrombosis of deep vessels of proximal lower extremity ([place]) 07/19/2011  . Anemia   . Chest pain, neg MI, normal coronaries by cath 02/18/2013  . CKD (chronic kidney disease) stage 3, GFR 30-59 ml/min (HCC) 02/19/2013  . Colon polyps   . Diabetic ulcer of right foot ([place])   . DVT (deep venous thrombosis) ([place]) 09/2002   patient reports additional DVTs in '06 & '11 (unconfirmed)  . GERD (gastroesophageal reflux disease)   . History of blood transfusion    "related to OR" (10/31/2016)  . Hyperlipidemia 02/19/2013  . Hypertension   . Nausea & vomiting    "constant for the last couple weeks" (10/31/2016)  . Nephrotic syndrome   . Obesity    BMI 44, weight 346 pounds 01/30/14  . OSA (obstructive sleep apnea) 02/28/2018   Mild obstructive sleep apnea with an AHI of 9.8/h but severe during REM sleep with an AHI of 33.8/h.  Oxygen saturations dropped to 86% and there was moderate snoring  . Prosthesis adjustment 08/17/2016  . Pulmonary embolism ([place]) 09/2002   treated with 6 months of warfarin  . Pyelonephritis 02/02/2018  . Type I diabetes mellitus ([place]) dx'd 2001      ROS:  Review of Systems  Constitutional: Negative for malaise/fatigue.  Respiratory: Negative for shortness of breath.   Cardiovascular: Negative for chest pain.  Musculoskeletal: Negative for falls.     Assessment / Plan / Recommendations:   Please see A&P under problem oriented charting for assessment of the patient's acute and chronic medical conditions.   As always, pt is advised that if symptoms worsen or new symptoms arise, they should go to an urgent care facility or to to ER for  further evaluation.   Consent and Medical Decision Making:   Patient discussed with Dr. Lynnae January  This is a telephone encounter between [name] and [name] on 06/27/2018 for morbid obesity and wheelchair dependence. The visit was conducted with the patient located at home and [name] at [facility]. The patient's identity was confirmed using their DOB and current address. The patient has consented to being evaluated through a telephone encounter and understands the associated risks (an examination cannot be done and the patient may need to come in for an appointment) / benefits (allows the patient to remain at home, decreasing exposure to coronavirus). I personally spent 10 minutes on medical discussion.

## 2018-06-27 NOTE — Telephone Encounter (Signed)
Informed patient of sleep study results and patient understanding was verbalized. Patient understands his sleep study showed they had a successful PAP titration and orders are in EPIC. Please set up 10 week OV with me.  Upon patient request DME selection is [facility]. Patient understands he will be contacted by [facility] to set up his cpap. Patient understands to call if [facility] does not contact him with new setup in a timely manner. Patient understands they will be called once confirmation has been received from [facility] that they have received their new machine to schedule 10 week follow up appointment.  [facility] notified of new cpap order  Please add to airview Patient was grateful for the call and thanked me.

## 2018-06-27 NOTE — Discharge Instructions (Signed)

## 2018-06-27 NOTE — Telephone Encounter (Signed)
-----   Message from Sueanne Margarita, MD sent at 06/21/2018  9:05 AM EDT ----- Please let patient know that they had a successful PAP titration and let DME know that orders are in EPIC.  Please set up 10 week OV with me.

## 2018-06-30 NOTE — Assessment & Plan Note (Signed)
HPI: Patient has a right AKA from history of DVT.  He states that despite exercise and diet he is not able to lose the amount of weight he would like in order to not depend on a wheelchair.  He would like a referral to bariatric surgery  Assessment: Morbid obesity Patient's BMI is 46.  He has chronic medical conditions including type 2 diabetes, CKD stage III and OSA.  He has shown weight loss in the past through diet and exercise.  I think he would be a good candidate for bariatric surgery.  Plan -Information for classes to be considered for bariatric surgery given

## 2018-07-03 ENCOUNTER — Telehealth: Payer: Self-pay | Admitting: *Deleted

## 2018-07-03 ENCOUNTER — Other Ambulatory Visit: Payer: Self-pay | Admitting: Endocrinology

## 2018-07-03 NOTE — Telephone Encounter (Signed)
PATIENT CALLED  ASKING ABOUT REFERRAL FOR WEIGHT LOSS SURGERY. SENDING MESSAGE TO HIS PCP.

## 2018-07-04 ENCOUNTER — Telehealth: Payer: Self-pay | Admitting: Dietician

## 2018-07-04 ENCOUNTER — Telehealth: Payer: Self-pay | Admitting: *Deleted

## 2018-07-04 ENCOUNTER — Encounter: Payer: Medicare Other | Admitting: Dietician

## 2018-07-04 DIAGNOSIS — N189 Chronic kidney disease, unspecified: Secondary | ICD-10-CM | POA: Diagnosis not present

## 2018-07-04 NOTE — Telephone Encounter (Signed)
Visit for today rescheduled per patient.

## 2018-07-05 NOTE — Progress Notes (Signed)
Internal Medicine Clinic Attending  Case discussed with Dr. Hoffman at the time of the visit.  We reviewed the resident's history and exam and pertinent patient test results.  I agree with the assessment, diagnosis, and plan of care documented in the resident's note.  

## 2018-07-05 NOTE — Telephone Encounter (Signed)
Entered in error

## 2018-07-08 ENCOUNTER — Telehealth: Payer: Self-pay | Admitting: *Deleted

## 2018-07-08 ENCOUNTER — Encounter: Payer: Self-pay | Admitting: *Deleted

## 2018-07-08 ENCOUNTER — Encounter: Payer: Self-pay | Admitting: Dietician

## 2018-07-08 ENCOUNTER — Other Ambulatory Visit: Payer: Self-pay | Admitting: Internal Medicine

## 2018-07-08 ENCOUNTER — Other Ambulatory Visit: Payer: Self-pay

## 2018-07-08 ENCOUNTER — Ambulatory Visit (INDEPENDENT_AMBULATORY_CARE_PROVIDER_SITE_OTHER): Payer: Medicare Other | Admitting: Dietician

## 2018-07-08 ENCOUNTER — Other Ambulatory Visit: Payer: Self-pay | Admitting: Endocrinology

## 2018-07-08 DIAGNOSIS — Z713 Dietary counseling and surveillance: Secondary | ICD-10-CM

## 2018-07-08 DIAGNOSIS — Z794 Long term (current) use of insulin: Secondary | ICD-10-CM

## 2018-07-08 DIAGNOSIS — E119 Type 2 diabetes mellitus without complications: Secondary | ICD-10-CM | POA: Diagnosis not present

## 2018-07-08 NOTE — Patient Instructions (Addendum)
Bring prosthesis so we can weigh and calculate your calorie needs.   Suggest taking a reduced amount of insulin for lower blood sugars rather than omitting and taking the full dose after the meal.  Donna [phone]

## 2018-07-08 NOTE — Telephone Encounter (Signed)
-----   Message from Sueanne Margarita, MD sent at 06/21/2018  9:05 AM EDT ----- Please let patient know that they had a successful PAP titration and let DME know that orders are in EPIC.  Please set up 10 week OV with me.

## 2018-07-08 NOTE — Telephone Encounter (Signed)
Informed patient of sleep study results and patient understanding was verbalized. Patient understands his sleep study showed they had a successful PAP titration and orders are in EPIC. Please set up 10 week OV with me.  Upon patient request DME selection is [facility]. Patient understands he will be contacted by [facility] to set up his cpap. Patient understands to call if [facility] does not contact him with new setup in a timely manner. Patient understands they will be called once confirmation has been received from [facility] that they have received their new machine to schedule 10 week follow up appointment.  [facility] notified of new cpap order  Please add to airview Patient was grateful for the call and thanked me.

## 2018-07-08 NOTE — Progress Notes (Addendum)
  Medical Nutrition Therapy :  Appt start time: 1320 end time:  1410. Total time:50 Visit # 1  07/08/2018 [name] [id number]  Assessment:  Primary concerns today: learn as much as he can about bariatric surgery and help with meal planning to assist with weight loss and lower blood sugars. He attended the seminar wants to decrease his weight to be able to walk with his prostheses. He never tried to use meal planning for weight loss before- only increased physical activity and he now thinks he needs to do more than that.  Preferred Learning Style: no preference indicated  Learning Readiness: Ready and Change in progress  ANTHROPOMETRICS: has not weighed in a while, did not want to weigh today WEIGHT HISTORY:  Wt Readings from Last 5 Encounters:  06/06/18 (!) 364 lb (165.1 kg)  05/07/18 (!) 362 lb 10.5 oz (164.5 kg)  04/16/18 (!) 362 lb 10.5 oz (164.5 kg)  04/02/18 (!) 392 lb 9.6 oz (178.1 kg)  03/27/18 (!) 370 lb (167.8 kg)   SLEEP:has sleep study, CPAP ordered; he is not looking forward to getting used to it. Sleeps ~ 6 hours a night 11om-12am to 6-7 am  MEDICATIONS: 1.2 mg Victoza, 90 units Tresiba and 20u Novolog qam, 25 uinits Novolog with lunch/afternon meal, 30 units Novolog with dinner/bedtime meal BLOOD SUGAR:meter download shows great improvement in blood sugar with 100% in target range in past 7 days: low of 72 and a hig of 532 over past 90 days.  Lab Results  Component Value Date   HGBA1C 7.8 (A) 06/18/2018   HGBA1C 5.6 02/20/2018   HGBA1C 8.7 (H) 09/17/2017   7 day aver- 168/14 day ave-188/ 30 day aver- 193/ 90 day aver- 227 DIETARY INTAKE: Usual eating pattern includes 3 meals and 0-1 snacks per day. Everyday foods include chicken, eggs, fruit, bread.  Avoided foods include many vegetables,   Food Intolerances: ? lactose Dining Out (times/week): 1-2x/wk 24-hr recall:  B ( 730 AM): 3 eggs white one yolk, mini bagel, OJ sometimes diced chicken  L ( 12-1 or later  PM): apple or smoothie if he doesn't;t eat full meal of wrap with lettuce, pesto, ham & [place], parm cheese or panera tomato basi soup with bagette and croutons D ( PM): flatbread(~ 7"x10") with diced chicken and veggies Beverages: water, OJ, powerade, diet ginger ale, sf lemonade  Usual physical activity: works out at home 3x/week  Progress Towards Goal(s):  In progress.   Nutritional Diagnosis:  NB-1.1 Food and nutrition-related knowledge deficit As related to lack of sufficient prior exposure to diabetes meal planning.  As evidenced by his questions and report.    Intervention:  Nutrition education about weight loss, importance of sleep to diabetes control and weight loss. Action Goal:take insulin before meals even if he takes half his usual dose because his sugar is lower than he is comfortable taking the full dose  Outcome goal: improved metabolism and blood sugars Coordination of care: he said he would discuss decreasing his lunch dose with Dr. Dwyane Dee; suggest increasing victoza as able to 1.8 mg/day  Teaching Method Utilized: Visual, Auditory,Hands on Handouts given during visit include:avs, carb counting book Barriers to learning/adherence to lifestyle change: competing values/demands Demonstrated degree of understanding via:  Teach Back   Monitoring/Evaluation:  Dietary intake, exercise, meter, and body weight in 1 week(s). Debera Lat, RD 07/08/2018 2:48 PM.

## 2018-07-16 ENCOUNTER — Ambulatory Visit: Payer: Medicare Other | Admitting: Dietician

## 2018-07-17 ENCOUNTER — Other Ambulatory Visit: Payer: Self-pay

## 2018-07-17 MED ORDER — INSULIN ASPART 100 UNIT/ML ~~LOC~~ SOLN: 25.0000 [IU] | Freq: Three times a day (TID) | SUBCUTANEOUS | 2 refills | Status: DC

## 2018-07-18 ENCOUNTER — Encounter: Payer: Self-pay | Admitting: Dietician

## 2018-07-18 ENCOUNTER — Ambulatory Visit (INDEPENDENT_AMBULATORY_CARE_PROVIDER_SITE_OTHER): Payer: Medicare Other | Admitting: Dietician

## 2018-07-18 ENCOUNTER — Other Ambulatory Visit: Payer: Self-pay

## 2018-07-18 DIAGNOSIS — Z713 Dietary counseling and surveillance: Secondary | ICD-10-CM

## 2018-07-18 DIAGNOSIS — Z794 Long term (current) use of insulin: Secondary | ICD-10-CM

## 2018-07-18 DIAGNOSIS — E119 Type 2 diabetes mellitus without complications: Secondary | ICD-10-CM

## 2018-07-18 NOTE — Progress Notes (Signed)
  Medical Nutrition Therapy :  Appt start time: 8416 end time:  1348. Total time:40 Visit # 2  07/18/2018 [name] [id number]  Assessment:  Primary concerns today: learn as much as he can about bariatric surgery; help with meal planning to assist with weight loss and lower blood sugars. His goal is to walk by the end of the year-November- December. His blood sugars are decreasing gradually.  He says he did not read over material from last week or make any changes. He plans to shop this weekend so he can start the meal plan then.  As weight and carb intake decrease anticipate further need to adjust his diabetes medicine.   ANTHROPOMETRICS: he did not bring prosthesis nor want to weigh today SLEEP:has sleep study, CPAP ordered; he is not looking forward to getting used to it. Sleeps ~ 6 hours a night 11om-12am to 6-7 am  MEDICATIONS: no change however he mentions that he feels low in the 70-80 mg/dl BLOOD SUGAR:meter download shows average 183 x 30 days, 1.3 tests/day,  range 72-457, pattern with fasting 08-1128 am <100 often when most (>60%) self monitoring occurs  DIETARY INTAKE: Usual eating pattern includes 3 meals and 1-2 snacks per day. Everyday foods include chicken, eggs, fruit, cheese, chex mix.  Avoided foods include many vegetables,   Food Intolerances: ? lactose Dining Out (times/week): 1-2x/wk 24-hr recall:  B ( 730 AM): 3 eggs with cheese, fruit  L ( 12-1 or later PM): has smoothie after working out to make [place] for calories used during workout Beverages: water, OJ, powerade, diet ginger ale, sf lemonade  Usual physical activity: works out at home 3x/week  Progress Towards Goal(s):  In progress.   Nutritional Diagnosis:  NB-1.1 Food and nutrition-related knowledge deficit As related to lack of sufficient prior exposure to diabetes meal planning.  As evidenced by his questions and report.    Intervention:  Nutrition education about weight loss, importance of meal planning  with balanced meals, no special foods needed, assisted with maing a list for shopping.  Meal plan is 10-11 servings carb/day, ~ 200 kcal /day Breakfast 2 carb choices Snack- 2 carb choices Lunch 2 carb choices  Dinner 3 carb choices Snack 1-2 carbs  Action Goal:Try to follow mal plan, take insulin before meals even if he takes half his usual dose because his sugar is lower than he is comfortable taking the full dose  Outcome goal: improved metabolism and blood sugars Coordination of care: he said he would discuss decreasing his lunch dose with Dr. Dwyane Dee; suggest increasing victoza as able to 1.8 mg/day and decreasing tresiba to 85 units, mealtime insulin to 25- 30- 30.  Teaching Method Utilized: Visual, Auditory,Hands on Handouts given during visit include:avs,meal plan Barriers to learning/adherence to lifestyle change: competing values/demands Demonstrated degree of understanding via:  Teach Back   Monitoring/Evaluation:  Dietary intake, exercise, meter, and body weight in 1 week(s). Debera Lat, RD 07/18/2018 2:22 PM.

## 2018-07-18 NOTE — Patient Instructions (Addendum)
Kirke Shaggy is the name of the weight loss dose of victoza.   I suggest you call the bariatric clinic at [facility] to find out if you are in or not--  [phone]  Shopping list-  Old fashioned oats- Carrots- fresh Cauliflower Broccoli Tomatoes Spring mix- Dried cherries- count carbs by looking at serving size and how many carbs are in that serving Dried cranberries- count carbs fresh fruits- blackberries, raspberries, apple and bananas  Annais Crafts [phone]

## 2018-07-24 ENCOUNTER — Encounter: Payer: Self-pay | Admitting: Dietician

## 2018-07-24 ENCOUNTER — Ambulatory Visit (INDEPENDENT_AMBULATORY_CARE_PROVIDER_SITE_OTHER): Payer: Medicare Other | Admitting: Dietician

## 2018-07-24 ENCOUNTER — Other Ambulatory Visit: Payer: Self-pay

## 2018-07-24 DIAGNOSIS — E119 Type 2 diabetes mellitus without complications: Secondary | ICD-10-CM | POA: Diagnosis not present

## 2018-07-24 DIAGNOSIS — Z794 Long term (current) use of insulin: Secondary | ICD-10-CM

## 2018-07-24 DIAGNOSIS — Z713 Dietary counseling and surveillance: Secondary | ICD-10-CM

## 2018-07-24 DIAGNOSIS — Z6841 Body Mass Index (BMI) 40.0 and over, adult: Secondary | ICD-10-CM

## 2018-07-24 NOTE — Progress Notes (Signed)
Medical Nutrition Therapy :  Appt start time: 7588 end time:  1359 Total time:45 minutes Visit # 3  07/24/2018 [name] [id number]  Assessment:  Primary concerns today: learn as much as he can about bariatric surgery; help with meal planning to assist with weight loss and lower blood sugars. His goal is to walk by the end of the year-November- December.    He feels that the plan for his meals is helping. He was abel to give me several examples of meals with 2 carb choices and also aberl to tell me how many grams of carb in 1 -2 and 3 choices. However he reports being hungry at times especially after working out. No hypoglycemia that he is aware of.His blood sugar is above target today and he reports that is because he only took 17 units of Novolog at bedtime (when he ate his dinner) last night. 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 He agreed to check blood sugars more often around exercise for a few days this week.  ANTHROPOMETRICS: He wore his prosthesis today so we were abel to weigh him.  Estimated body mass index is 47.42 kg/m as calculated from the following:   Height as of 06/18/18: 6\' 2"  (1.88 m).   Weight as of this encounter: 369 lb 4.8 oz (167.5 kg).  Wt Readings from Last 5 Encounters:  07/24/18 (!) 369 lb 4.8 oz (167.5 kg)  06/06/18 (!) 364 lb (165.1 kg)  05/07/18 (!) 362 lb 10.5 oz (164.5 kg)  04/16/18 (!) 362 lb 10.5 oz (164.5 kg)  04/02/18 (!) 392 lb 9.6 oz (178.1 kg)   SLEEP:has sleep study, CPAP ordered; Sleeps ~ 6 hours a night 11om-12am to 6-7 am  MEDICATIONS:  feels low in the 70-80 mg/dl; confirmed taking 90 units daily of Tresiba, 30-25 and 30 of Novolog, 1. 2 mg victoza per day  BLOOD SUGAR:meter download shows average for 90 days 213, 30 days-181 and 7 days 207 with only 5 values and range of 123-285. He refused professional CGM again today. showed him the Dexcom personal CGM  and the [place]  DIETARY INTAKE: Not gone over in detail today because this was done on prior visits. He reports eating a sandwich with pits, egg whites for breakfast, protein bar for work out recovery  Usual physical activity: work outs increased per [name] to two times a day for 30 minutes 5 days a week.   Progress Towards Goal(s):  Some progress.   Nutritional Diagnosis:  NB-1.1 Food and nutrition-related knowledge deficit As related to lack of sufficient prior exposure to diabetes meal planning is improving  As evidenced by his questions and report.    Intervention:  Nutrition education about weight loss adjustment to food, exercise and diabetes medicine. Exercise guidelines provide on reducing mealtime insulin prior to planned exercise by 30-50%.   Snack 1-2 carbs  Action Goal:Try to follow mal plan, take insulin before meals even if he takes half his usual dose because his sugar is lower than he is comfortable  Outcome goal: improved metabolism and blood sugars Coordination of care: he said he would discuss diabetes medicines - decreasing his pre-workout doses and increasing victoza with Dr. Dwyane Dee; suggest increasing victoza as able to 1.8 mg/day and decreasing insulin doses accordingly.  Teaching Method Utilized: Visual, Auditory,Hands on Handouts given during visit include:avs, food and medication adjustment for exercise Barriers to learning/adherence to  lifestyle change: competing values/demands Demonstrated degree of understanding via:  Teach Back   Monitoring/Evaluation:  Dietary intake, exercise, meter, and body weight in 1 week(s). Debera Lat, RD 07/24/2018 1:11 PM.

## 2018-07-24 NOTE — Patient Instructions (Addendum)
Please check blood sugar more- suggest goal of two times daily for now- set phone reminder around working out is really helpful because that is going to guide you about how to fuel fo your work out  Check Tresiba to see if you are using u-100 vs u-200. Wonder if u- 200 would help?  It's going to be important to adjust your insulin to help you lose weight rather than eating more.   It will help to have more data form blood sugars to determine when this can be done.   [name] [phone]

## 2018-07-25 ENCOUNTER — Encounter (HOSPITAL_COMMUNITY): Payer: Medicare Other

## 2018-07-25 ENCOUNTER — Ambulatory Visit (INDEPENDENT_AMBULATORY_CARE_PROVIDER_SITE_OTHER): Payer: Medicare Other | Admitting: Internal Medicine

## 2018-07-25 ENCOUNTER — Telehealth: Payer: Self-pay | Admitting: *Deleted

## 2018-07-25 DIAGNOSIS — Z20828 Contact with and (suspected) exposure to other viral communicable diseases: Secondary | ICD-10-CM | POA: Insufficient documentation

## 2018-07-25 NOTE — Telephone Encounter (Signed)
Per dr Evette Doffing, pt needs televisit today or tomorrow

## 2018-07-25 NOTE — Progress Notes (Signed)
   CC: covid exposure  This is a telephone encounter between [name] and [place] on 07/25/2018 for covid 19 exposure. The visit was conducted with the patient located at home and [name] at [facility]. The patient's identity was confirmed using their DOB and current address. The patient has consented to being evaluated through a telephone encounter and understands the associated risks (an examination cannot be done and the patient may need to come in for an appointment) / benefits (allows the patient to remain at home, decreasing exposure to coronavirus). I personally spent 8 minutes on medical discussion.   HPI:  [name] is a 43 y.o. with PMH as below presenting via telehealth visit for recent covid 19 exposure. He has an aide who comes by his house. Most recently she was there seven days ago. Yesterday she tested positive for the coronavirus. At the time of the visit she had no symptoms and did not develop symptoms until two days ago. While she was in his home they did not wear masks. He has also has a roommate he has been around in passing. He currently has no SOB, malaise, chest pain, cough, sore throat, fever, anosmia, nausea, abdominal pain, diarrhea, or any other symptom of illness.    Please see A&P for assessment of the patient's acute and chronic medical conditions.    Past Medical History:  Diagnosis Date  . Acute venous embolism and thrombosis of deep vessels of proximal lower extremity ([place]) 07/19/2011  . Anemia   . Chest pain, neg MI, normal coronaries by cath 02/18/2013  . CKD (chronic kidney disease) stage 3, GFR 30-59 ml/min (HCC) 02/19/2013  . Colon polyps   . Diabetic ulcer of right foot ([place])   . DVT (deep venous thrombosis) ([place]) 09/2002   patient reports additional DVTs in '06 & '11 (unconfirmed)  . GERD (gastroesophageal reflux disease)   . History of blood transfusion    "related to OR" (10/31/2016)  . Hyperlipidemia 02/19/2013  . Hypertension   .  Nausea & vomiting    "constant for the last couple weeks" (10/31/2016)  . Nephrotic syndrome   . Obesity    BMI 44, weight 346 pounds 01/30/14  . OSA (obstructive sleep apnea) 02/28/2018   Mild obstructive sleep apnea with an AHI of 9.8/h but severe during REM sleep with an AHI of 33.8/h.  Oxygen saturations dropped to 86% and there was moderate snoring  . Prosthesis adjustment 08/17/2016  . Pulmonary embolism ([place]) 09/2002   treated with 6 months of warfarin  . Pyelonephritis 02/02/2018  . Type I diabetes mellitus ([place]) dx'd 2001   Review of Systems:   ROS negative except as noted in HPI.   Assessment & Plan:   See Encounters Tab for problem based charting.  Patient discussed with Dr. Rebeca Alert

## 2018-07-25 NOTE — Assessment & Plan Note (Signed)
He has an aide who comes by his house. Most recently she was there seven days ago. Yesterday she tested positive for the coronavirus. At the time of the visit she had no symptoms and did not develop symptoms until two days ago. While she was in his home they did not wear masks. He has also has a roommate he has been around in passing. He currently has no SOB, malaise, chest pain, cough, sore throat, fever, anosmia, nausea, abdominal pain, diarrhea, or any other symptom of illness. He has not left the house since finding out she tested positive earlier today.   He is high risk for covid with his multiple comorbidities. Provided information on available testing with [facility] testing site as he does not meet criteria for testing at [facility].  - discussed quarantining for 14 days which he stated would not be a problem. He will also tell his roommate to get tested and to quarantine - both were instructed to follow-up if they develop symptoms of cough, fever, shortness of breath or other symptoms.

## 2018-07-25 NOTE — Telephone Encounter (Signed)
NURSING TRIAGE NOTE FOR RESPIRATORY SYMPTOMS  Pt states his [place] aide tested positive 07/24/2018 in the [place]. He last saw her for appr 2 hrs Friday 7/10, he usually sees her 5 days, he had not seen her any other day of last week, the week before he saw her for only 1 day. Neither of them wear masks at visits. He also has a roommate and he has made her aware of the situation. He is cautioned not to go out, to wear mask and keep social distance of 6 ft, to clean surfaces often and after touching. Please advise, pt ph# [phone]  Do you have a fever? Checking axillary as we speak "99.1 axillary"  Do you have a cough? "no"  Do you have shortness of breath more than normal? "no"  Do you have chest pain?"no"  Are you able to eat and drink normally? "yes"  Have you seen a physician for these symptoms? "no"  Action   I informed patient to expect a phone call from a physician soon and I sent request to front desk to schedule a virtual office appt for patient and arrive the patient.

## 2018-07-25 NOTE — Telephone Encounter (Signed)
I haven't seen this patient on the televisit list yet? Is this set up so a resident can call today? Tomorrow morning televisit would also be fine.

## 2018-07-26 NOTE — Progress Notes (Signed)
Internal Medicine Clinic Attending  Case discussed with Dr. Seawell at the time of the visit.  We reviewed the resident's history and exam and pertinent patient test results.  I agree with the assessment, diagnosis, and plan of care documented in the resident's note.  [name] Raines, M.D., Ph.D.  

## 2018-07-29 ENCOUNTER — Other Ambulatory Visit: Payer: Self-pay

## 2018-07-29 ENCOUNTER — Ambulatory Visit (INDEPENDENT_AMBULATORY_CARE_PROVIDER_SITE_OTHER): Payer: Medicare Other | Admitting: Endocrinology

## 2018-07-29 ENCOUNTER — Encounter: Payer: Self-pay | Admitting: Endocrinology

## 2018-07-29 DIAGNOSIS — E1165 Type 2 diabetes mellitus with hyperglycemia: Secondary | ICD-10-CM

## 2018-07-29 DIAGNOSIS — Z794 Long term (current) use of insulin: Secondary | ICD-10-CM | POA: Diagnosis not present

## 2018-07-29 NOTE — Progress Notes (Signed)
Patient ID: [name], male   DOB: [birth year], 43 y.o.   MRN: [id number]          Reason for Appointment: Follow-up for Type 2 Diabetes   Today's office visit was provided via telemedicine using video technique Explained to the patient and the the limitations of evaluation and management by telemedicine and the availability of in person appointments.  The patient understood the limitations and agreed to proceed. Patient also understood that the telehealth visit is billable. . Location of the patient: Home . Location of the provider: Office Only the patient and myself were participating in the encounter   History of Present Illness:          Date of diagnosis of type 2 diabetes mellitus:  2001      His weight is about 400 pounds at the time of diagnosis He does not know what his initial blood sugars and circumstances were but he apparently was started on insulin initially He was not able to tolerate metformin because of diarrhea Subsequently has been on insulin only, level of control not available but his A1c was significantly high at 12.8 in 2015  Background history:    Recent history:   INSULIN regimen is:   90 units Tresiba, NovoLog 20-25 units 4 times a day   Non-insulin hypoglycemic drugs the patient is taking are: Victoza: 1.2 mg daily  His A1c was last 5.6 done in 2/20 and now 7.8  Current management, blood sugar patterns and problems identified:  On his last visit his Tyler Aas was increased because of persistently high pre-meal blood sugars especially fasting  Also he was asked to go up to as much as 30 units of NovoLog at dinnertime  He was also continued on a higher dose of 1.2 mg Victoza which he is tolerating without nausea  Previously had issues with nausea and abdominal discomfort  Currently he is trying to exercise at home up to twice a day  Mealtime insulin: He has taken less insulin and only taking 20 units if he is eating a small or low carbohydrate  meal or exercising  However not clear if he is doing this consistently and he says that he is adjusting his insulin based on blood sugar level but only has pre-meal blood sugars before eating  Not clear why he started taking NovoLog at bedtime also even though he does not have a large snack at that time  He may be doing a little better with his diet with working with the dietitian regularly  However does not appear to have lost any weight  Blood sugars after dinner are mostly looking fairly good with only a couple of readings recently over 180  However not checking readings 2 hours after breakfast and lunch  No hypoglycemia documented  He has not been able to locate his meter for the last 5 days until last night   Side effects from medications have been: Diarrhea with metformin, vomiting with Ozempic  Compliance with the medical regimen: Improving   Glucose monitoring:  done at least 1 a day         Glucometer:   ACCU-CHEK [employer]  Blood sugar readings from patient meter related by patient with readings between 7/9 and 7/15   PRE-MEAL Fasting Lunch Dinner Bedtime Overall  Glucose range:  82-210  104-285 ?    Mean/median:      Similac   POST-MEAL PC Breakfast PC Lunch PC Dinner  Glucose range:    122-238  Mean/median:      Previous readings  PRE-MEAL Fasting Lunch Dinner Bedtime Overall  Glucose range:  369, 236  257  221  210-312   Mean/median:     ?   POST-MEAL PC Breakfast PC Lunch PC Dinner  Glucose range:  97-197    Mean/median:          Self-care:    Typical meal intake: Breakfast is egg whites, some oatmeal/grits and banana.  Usually small portions of meat or other protein at lunch and dinner and only a small amount of sweet potato at lunch or carbohydrate              Dietician visit, most recent: Unknown               Exercise:  Previously was doing 2 sessions of 1 hour each at the gym with weights, resistance and some cardio,, recently less frequent and  less intensive    Weight history:  Wt Readings from Last 3 Encounters:  07/24/18 (!) 369 lb 4.8 oz (167.5 kg)  06/06/18 (!) 364 lb (165.1 kg)  05/07/18 (!) 362 lb 10.5 oz (164.5 kg)    Glycemic control:   Lab Results  Component Value Date   HGBA1C 7.8 (A) 06/18/2018   HGBA1C 5.6 02/20/2018   HGBA1C 8.7 (H) 09/17/2017   Lab Results  Component Value Date   MICROALBUR 160.61 (H) 08/14/2013   LDLCALC 91 06/05/2017   CREATININE 3.51 (H) 05/07/2018   Lab Results  Component Value Date   MICRALBCREAT 818.2 (H) 08/14/2013    Lab Results  Component Value Date   FRUCTOSAMINE 263 12/04/2017      Allergies as of 07/29/2018      Reactions   Reglan [metoclopramide] Other (See Comments)   Dysphoric reaction      Medication List       Accurate as of July 29, 2018  8:57 AM. If you have any questions, ask your nurse or doctor.        accu-chek softclix lancets Use to check blood sugars 5 times a day. Dx code:E11.9. Insulin dependent.   Accu-Chek Softclix Lancets lancets USE TO CHECK BLOOD SUGAR 5 TIMES DAILY. DX CODE E11.9.   allopurinol 100 MG tablet Commonly known as: ZYLOPRIM TAKE 2 TABLETS BY MOUTH ONCE DAILY   Alpha-Lipoic Acid 200 MG Caps Take 1 capsule (200 mg total) by mouth daily.   apixaban 5 MG Tabs tablet Commonly known as: Eliquis Take 1 tablet (5 mg total) by mouth 2 (two) times daily.   aspirin EC 81 MG tablet Take 81 mg by mouth as needed for mild pain (or headaches).   B-12 2500 MCG Subl Place 2,500 mcg under the tongue daily.   calcitRIOL 0.25 MCG capsule Commonly known as: ROCALTROL Take 1 capsule by mouth once daily   carvedilol 25 MG tablet Commonly known as: COREG Take 1 tablet by mouth twice daily   cloNIDine 0.3 MG tablet Commonly known as: CATAPRES Take 1 tablet (0.3 mg total) by mouth 2 (two) times daily.   famotidine 20 MG tablet Commonly known as: PEPCID Take 1 tablet (20 mg total) by mouth 2 (two) times daily.   FISH  OIL PO Take 1 capsule by mouth at bedtime.   furosemide 40 MG tablet Commonly known as: LASIX Take 80 mg by mouth 2 (two) times daily.   gabapentin 100 MG capsule Commonly known as: NEURONTIN Take 1 capsule (100 mg total) by mouth 3 (three) times daily.  glucose blood test strip USE AS INSTRUCTED TO CHECK BLOOD SUGARS THREE TIMES DAILY   hydrALAZINE 50 MG tablet Commonly known as: APRESOLINE Take 50 mg by mouth 4 (four) times daily.   insulin aspart 100 UNIT/ML injection Commonly known as: novoLOG Inject 25 Units into the skin 3 (three) times daily before meals.   Insulin Degludec 200 UNIT/ML Sopn Inject 60 Units into the skin daily. What changed:   how much to take  when to take this   lovastatin 20 MG tablet Commonly known as: MEVACOR TAKE 1 TABLET BY MOUTH ONCE DAILY What changed: when to take this   metolazone 2.5 MG tablet Commonly known as: ZAROXOLYN Take 2.5 mg by mouth every other day.   ondansetron 4 MG disintegrating tablet Commonly known as: Zofran ODT Take 1 tablet (4 mg total) by mouth every 8 (eight) hours as needed for nausea or vomiting.   oxybutynin 5 MG tablet Commonly known as: DITROPAN Take 5 mg by mouth 3 (three) times daily.   oxyCODONE-acetaminophen 5-325 MG tablet Commonly known as: PERCOCET/ROXICET Take 1 tablet by mouth every 6 (six) hours as needed for severe pain.   Pen Needles 3/16" 31G X 5 MM Misc Use five times daily   Insulin Pen Needle 31G X 5 MM Misc Commonly known as: B-D UF III MINI PEN NEEDLES USE  4 TIMES DAILY   SUMAtriptan 50 MG tablet Commonly known as: IMITREX Take 1 tablet (50 mg total) by mouth every 2 (two) hours as needed for migraine. May repeat in 2 hours if headache persists or recurs.   tamsulosin 0.4 MG Caps capsule Commonly known as: FLOMAX Take 0.4 mg by mouth at bedtime.   Victoza 18 MG/3ML Sopn Generic drug: liraglutide INJECT 1.2 MG (0.2 MLS) INTO THE SKIN ONCE DAILY AT THE SAME TIME.   zinc  sulfate 220 (50 Zn) MG capsule Take 220 mg by mouth daily.       Allergies:  Allergies  Allergen Reactions  . Reglan [Metoclopramide] Other (See Comments)    Dysphoric reaction    Past Medical History:  Diagnosis Date  . Acute venous embolism and thrombosis of deep vessels of proximal lower extremity ([place]) 07/19/2011  . Anemia   . Chest pain, neg MI, normal coronaries by cath 02/18/2013  . CKD (chronic kidney disease) stage 3, GFR 30-59 ml/min ([facility]) 02/19/2013  . Colon polyps   . Diabetic ulcer of right foot ([place])   . DVT (deep venous thrombosis) ([place]) 09/2002   patient reports additional DVTs in '06 & '11 (unconfirmed)  . GERD (gastroesophageal reflux disease)   . History of blood transfusion    "related to OR" (10/31/2016)  . Hyperlipidemia 02/19/2013  . Hypertension   . Nausea & vomiting    "constant for the last couple weeks" (10/31/2016)  . Nephrotic syndrome   . Obesity    BMI 44, weight 346 pounds 01/30/14  . OSA (obstructive sleep apnea) 02/28/2018   Mild obstructive sleep apnea with an AHI of 9.8/h but severe during REM sleep with an AHI of 33.8/h.  Oxygen saturations dropped to 86% and there was moderate snoring  . Prosthesis adjustment 08/17/2016  . Pulmonary embolism ([place]) 09/2002   treated with 6 months of warfarin  . Pyelonephritis 02/02/2018  . Type I diabetes mellitus ([place]) dx'd 2001    Past Surgical History:  Procedure Laterality Date  . AMPUTATION Right 12/22/2013   Procedure: AMPUTATION BELOW KNEE;  Surgeon: [name], MD;  Location: [place];  Service: Orthopedics;  Laterality: Right;  . AMPUTATION Right 02/19/2014   Procedure: RIGHT ABOVE KNEE AMPUTATION ;  Surgeon: [name], MD;  Location: [place];  Service: Orthopedics;  Laterality: Right;  . CARDIAC CATHETERIZATION  02/18/2013   normal coronaries  . ESOPHAGOGASTRODUODENOSCOPY N/A 11/02/2016   Procedure: ESOPHAGOGASTRODUODENOSCOPY (EGD);  Surgeon: [name], MD;  Location: [facility] ENDOSCOPY;  Service:  Endoscopy;  Laterality: N/A;  . I&D EXTREMITY Right 12/14/2013   Procedure: IRRIGATION AND DEBRIDEMENT RIGHT FOOT;  Surgeon: [name], MD;  Location: [place];  Service: Orthopedics;  Laterality: Right;  . INCISION AND DRAINAGE ABSCESS  2007; 2015   "back"  . INCISION AND DRAINAGE OF WOUND Right 12/22/2013   Procedure: I&D RIGHT BUTTOCK;  Surgeon: [name], MD;  Location: [place];  Service: Orthopedics;  Laterality: Right;  . LEFT HEART CATHETERIZATION WITH CORONARY ANGIOGRAM N/A 02/18/2013   Procedure: LEFT HEART CATHETERIZATION WITH CORONARY ANGIOGRAM;  Surgeon: [name], MD;  Location[facility] CATH LAB;  Service: Cardiovascular;  Laterality: N/A;    Family History  Problem Relation Age of Onset  . Heart disease Mother   . Diabetes Mother   . Diabetes Maternal Uncle   . Diabetes Cousin     Social History:  reports that he has never smoked. He has never used smokeless tobacco. He reports current alcohol use. He reports that he does not use drugs.   Review of Systems    Lipid history: Has been on lovastatin with control as follows: Prescribed by PCP   Lab Results  Component Value Date   CHOL 150 06/05/2017   HDL 33.30 (L) 06/05/2017   LDLCALC 91 06/05/2017   TRIG 127.0 06/05/2017   CHOLHDL 4 06/05/2017           Hypertension: He is taking clonidine, Norvasc and Coreg followed by nephrologist  BP Readings from Last 3 Encounters:  06/27/18 139/69  06/18/18 137/77  05/07/18 (!) 142/76     Most recent eye exam was in 08/2017  Most recent foot exam: 2/72   Complications of diabetes: Nephropathy with renal insufficiency and proteinuria, unknown status of retinopathy, erectile dysfunction  Followed by nephrologist for CKD also Recently higher creatinine when he was having gastritis  Lab Results  Component Value Date   CREATININE 3.51 (H) 05/07/2018   CREATININE 2.88 (H) 04/24/2018   CREATININE 2.59 (H) 04/16/2018     LABS:  No visits with results within 1  Week(s) from this visit.  Latest known visit with results is:  Hospital Outpatient Visit on 06/27/2018  Component Date Value Ref Range Status  . Hemoglobin 06/27/2018 10.1* 13.0 - 17.0 g/dL Final    Physical Examination:  There were no vitals taken for this visit.      ASSESSMENT:  Diabetes type 2, with morbid obesity  See history of present illness for detailed discussion of current diabetes management, blood sugar patterns and problems identified  His last A1c is last 7.8  He blood sugars are generally much better than on his last visit  PLAN:   He will continue Victoza 1.2 mg daily for now, may have adverse reactions with larger doses Increase Tresiba only if his fasting readings start going up consistently Stop NovoLog at bedtime unless eating a large snack with more than 20 g carbohydrate More consistent blood sugar monitoring at various times including after meals He may be a good candidate for freestyle libre but only if he is documenting monitoring 4 times a day He  will continue to work with a dietitian to have balanced low fat low carbohydrate meal Discussed that he does not need to have snacks in between meals or bedtime unless getting increased hunger Continue to adjust mealtime dose based on portions and amounts of carbohydrates  Will review his management in about [redacted] weeks along with A1c  There are no Patient Instructions on file for this visit.     [name] 07/29/2018, 8:57 AM   Note: This office note was prepared with Dragon voice recognition system technology. Any transcriptional errors that result from this process are unintentional.

## 2018-07-30 ENCOUNTER — Ambulatory Visit: Payer: Medicare Other | Admitting: Dietician

## 2018-07-30 ENCOUNTER — Other Ambulatory Visit: Payer: Self-pay

## 2018-07-30 ENCOUNTER — Ambulatory Visit (INDEPENDENT_AMBULATORY_CARE_PROVIDER_SITE_OTHER): Payer: Medicare Other | Admitting: Dietician

## 2018-07-30 DIAGNOSIS — Z6841 Body Mass Index (BMI) 40.0 and over, adult: Secondary | ICD-10-CM

## 2018-07-30 DIAGNOSIS — Z794 Long term (current) use of insulin: Secondary | ICD-10-CM | POA: Diagnosis not present

## 2018-07-30 DIAGNOSIS — Z713 Dietary counseling and surveillance: Secondary | ICD-10-CM | POA: Diagnosis not present

## 2018-07-30 DIAGNOSIS — E119 Type 2 diabetes mellitus without complications: Secondary | ICD-10-CM | POA: Diagnosis not present

## 2018-07-30 NOTE — Patient Instructions (Addendum)
Meal ideas-  [place]- Pasta with sauteed vegetables and chicken Asian- Stir fried chicken and veggies over brown rice American- grilled chicken, sweet potato(bake in microwave), broccoli/brussel sprouts, or green beans with onions Seafood- salmon or tuna burger on english muffin spread with tartar sauce, green salad  Meal salad- celery, mushrooms, peppers, onions, raisins/blueberries/chopped apple or strawberry, add garbanzo beans, nuts or sunflower seeds on a bed of spring mix, enjoy with whole wheat bread or crackers and [place], balsamic or vinaigrette dressing  Easy and Delicious Pizza- 1-  bake whole wheat pita pocket in oven for 5 minutes on 400 2- spread pasta or pesto on pita as sauce 3- spread with shredded mozzarella and a little parmesan cheese 4- Top with chopped pepeers, backl olives, onions or nay others you desire 5- bake at 400 for 15 minutes  See enclosed recipes for more ideas.  Bon appetite!  [name]

## 2018-07-30 NOTE — Progress Notes (Signed)
Medical Nutrition Therapy :  Appt start time: 7829 end time:  1547 Total time:33 minutes Visit # 4 Telephone visit due to COVID-19.  This is a telephone encounter between [name] and [name] on 07/30/2018 for Medical Nutrition Therapy. The visit was conducted with the patient located at home and Debera Lat at [facility]. The patient's identity was confirmed using their DOB and current address. The patient has consented to being evaluated through a telephone encounter and understands the associated risks / benefits (allows the patient to remain at home, decreasing exposure to coronavirus). I personally spent 33 minutes on medical nutrition therapy discussion.   The following statements were read to the patient and/or legal guardian who is established with the Provider.   "The purpose of this phone visit is to provide Medical Nutrition Therapy while limiting exposure to the coronavirus (COVID19).  There is a possibility of technology failure and discussed alternative modes of communication if that failure occurs."   "By engaging in this telephone visit, you consent to the provision of healthcare.  Additionally, you authorize for your insurance to be billed for the services provided during this telephone visit."    Patient and/or legal guardian consented to telephone visit: yes  07/30/2018 [name] [id number]  Assessment:  Primary concerns today: Would like ideas to give meals more variety. He asks about bariatric surgery; goal is meal planning to assist with weight loss and lower blood sugars. His goal is to walk by the end of the year-November- December.    He feels that the plan for his meals is helping. He still reports being hungry at times but the snacks are helping with that..  ANTHROPOMETRICS:  Estimated body mass index is 47.42 kg/m as calculated from the following:   Height as of 06/18/18: 6\' 2"  (1.88 m).   Weight as of 07/24/18: 369 lb 4.8 oz (167.5 kg).  SLEEP:CPAP ready;  Sleeps ~ 6 hours a night 11om-12am to 6-7 am  MEDICATIONS: confirmed taking 90 units daily of Tresiba, 20-25 and 30 of Novolog, but when close to bedtime and depending on reading he may reduce this dose to 20; 1. 2 mg victoza per day  BLOOD SUGAR: He checked more often- 21 times in the past week- he says they are improving; last week average- 207 He denies symptoms of Hypoglycemia or Hyperglycemia.   DIETARY INTAKE: Breakfast- mini everything bagel, whole egg x 2 whites,  2 slices bacon, water protein bar and baby carrots for snacks Dinner- chicken sandwich-thighs,honey mustard, spring mix, 1 roll, bottled water     PHYSICAL ACTIVITY: has adjusted insulin for exercise. work outs increased per Mr. Darden to two times a day for 30 minutes 5 days a week.   Progress Towards Goal(s):  Some progress.   Nutritional Diagnosis:  NB-1.1 Food and nutrition-related knowledge deficit As related to lack of sufficient prior exposure to diabetes meal planning is improving  As evidenced by his questions and report.    Intervention:  Nutrition education about weight loss adjustment to food, exercise and diabetes medicine. Exercise guidelines provide on reducing mealtime insulin prior to planned exercise by 30-50%.  Meals- 2 carbs, veggies, protein and healthy fats Snack 1-2 carbs  Action Goal:Try to follow meal plan, take insulin before meals even if he takes half his usual dose because his sugar is lower than he is comfortable  Outcome goal: improved metabolism and blood sugars Coordination of care:none.  Teaching Method Utilized:  Auditory Handouts given during visit include:avs Barriers  to learning/adherence to lifestyle change: competing values/demands Demonstrated degree of understanding via:  Teach Back   Monitoring/Evaluation:  Dietary intake, exercise, meter,prosthesis and body weight in 1 week(s). Debera Lat, RD 07/30/2018 3:12 PM.

## 2018-08-06 ENCOUNTER — Ambulatory Visit: Payer: Medicare Other | Admitting: Dietician

## 2018-08-07 ENCOUNTER — Ambulatory Visit (INDEPENDENT_AMBULATORY_CARE_PROVIDER_SITE_OTHER): Payer: Medicare Other | Admitting: Dietician

## 2018-08-07 ENCOUNTER — Encounter: Payer: Self-pay | Admitting: Dietician

## 2018-08-07 DIAGNOSIS — Z713 Dietary counseling and surveillance: Secondary | ICD-10-CM

## 2018-08-07 DIAGNOSIS — E119 Type 2 diabetes mellitus without complications: Secondary | ICD-10-CM

## 2018-08-07 DIAGNOSIS — Z794 Long term (current) use of insulin: Secondary | ICD-10-CM

## 2018-08-07 NOTE — Progress Notes (Signed)
Medical Nutrition Therapy :  Appt start time: 1318 end time:  1340 Total time:22 minutes Visit # 5 Telephone visit due to COVID-19.  This is a telephone encounter between [name] and [name] on 08/07/2018 for Medical Nutrition Therapy. The visit was conducted with the patient located at home and [name] at [facility]. The patient's identity was confirmed using their DOB and current address. The patient has consented to being evaluated through a telephone encounter and understands the associated risks / benefits (allows the patient to remain at home, decreasing exposure to coronavirus). I personally spent 22  minutes on medical nutrition therapy discussion.   Assessment:  Primary concerns today:  Ideas to give meals more variety- got them, has not looked through Bariatric surgery goal-received packet, mychart- he will work on getting in  He feels that the plan for his meals is helping him reach his gols. He eats.2-3 meals, takes Novolog 20-25-30 (depends on blood sugar) . He was unable to clarify if he skips a meal does he skip the Novolog.   ANTHROPOMETRICS:  He agreed to bring his prosthesis next week SLEEP:CPAP ready; has an appointment to get it next week  MEDICATIONS:deferred detailed discussion today  BLOOD SUGAR: He checked  X 7 day 181(19), 14, 181(32), 30 day  177(63) results are: He denies symptoms of Hypoglycemia or Hyperglycemia in the past week. No, highest 229, lowest 93   DIETARY INTAKE:   We deferred this today  PHYSICAL ACTIVITY: has adjusted insulin for exercise. work outs increased per Mr. Baldwin to two times a day for 30 minutes 5 days a week.   Progress Towards Goal(s):  Some progress.   Nutritional Diagnosis:  NB-1.1 Food and nutrition-related knowledge deficit As related to lack of sufficient prior exposure to diabetes meal planning is improving  As evidenced by his blood sugar values and report.    Intervention:  nutrition and diabetes support for  evaluating his own progress and what behavior(s) are/is causing the change.   Action Goal:Try to create a login for mychart, follow meal plan, take insulin before meals even if he takes half his usual dose because his sugar is lower than he is comfortable  Outcome goal: improved metabolism and blood sugars Coordination of care:none.  Teaching Method Utilized:  Auditory Handouts given during visit include:avs Barriers to learning/adherence to lifestyle change: competing values/demands Demonstrated degree of understanding via:  Teach Back   Monitoring/Evaluation:  Dietary intake, exercise, meter,prosthesis and body weight in 1 week(s). [name], RD 08/07/2018 1:17 PM.

## 2018-08-07 NOTE — Patient Instructions (Addendum)
Hello [name],  Glad you are done with quarantining!!!  Try to create a login for mychart: see the highlighted information attached to this paper.     Congrats on lowering your average blood sugar form 207 last week to 181 this week!!!  It sounds like you did this by: Eating more nutritious foods Taking insulin before meals even if you feel you need to take less to prevent a low blood sugar  Look forward to seeing you next week.  [name] [phone]

## 2018-08-13 ENCOUNTER — Other Ambulatory Visit: Payer: Self-pay | Admitting: Endocrinology

## 2018-08-14 ENCOUNTER — Ambulatory Visit: Payer: Medicare Other | Admitting: Dietician

## 2018-08-19 ENCOUNTER — Other Ambulatory Visit: Payer: Self-pay | Admitting: Internal Medicine

## 2018-08-19 DIAGNOSIS — Z794 Long term (current) use of insulin: Secondary | ICD-10-CM

## 2018-08-19 DIAGNOSIS — E119 Type 2 diabetes mellitus without complications: Secondary | ICD-10-CM

## 2018-08-22 ENCOUNTER — Encounter (HOSPITAL_COMMUNITY): Payer: Medicare Other

## 2018-08-26 ENCOUNTER — Encounter: Payer: Self-pay | Admitting: Internal Medicine

## 2018-08-27 DIAGNOSIS — E113292 Type 2 diabetes mellitus with mild nonproliferative diabetic retinopathy without macular edema, left eye: Secondary | ICD-10-CM | POA: Diagnosis not present

## 2018-08-27 DIAGNOSIS — H35031 Hypertensive retinopathy, right eye: Secondary | ICD-10-CM | POA: Diagnosis not present

## 2018-08-27 DIAGNOSIS — E113211 Type 2 diabetes mellitus with mild nonproliferative diabetic retinopathy with macular edema, right eye: Secondary | ICD-10-CM | POA: Diagnosis not present

## 2018-08-27 DIAGNOSIS — Q141 Congenital malformation of retina: Secondary | ICD-10-CM | POA: Diagnosis not present

## 2018-08-27 DIAGNOSIS — H3581 Retinal edema: Secondary | ICD-10-CM | POA: Diagnosis not present

## 2018-08-28 ENCOUNTER — Other Ambulatory Visit: Payer: Self-pay | Admitting: *Deleted

## 2018-08-28 NOTE — Telephone Encounter (Signed)
Patient has a 10 week follow up appointment scheduled for 10/23/18. Patient understands he needs to keep this appointment for insurance compliance. Patient was grateful for the call and thanked me.

## 2018-08-28 NOTE — Telephone Encounter (Signed)
According to [facility], ekiquis was last refilled on 06/28/18.

## 2018-08-29 ENCOUNTER — Telehealth: Payer: Self-pay | Admitting: Dietician

## 2018-08-29 NOTE — Telephone Encounter (Signed)
[name] said getting ready for college courses distracted him. He is now ready to resume sessions for Medical Nutrition Therapy. Appointment scheduled for Tuesday, August 25 at 10:15 AM

## 2018-09-02 MED ORDER — APIXABAN 5 MG PO TABS: 5.0000 mg | ORAL_TABLET | Freq: Two times a day (BID) | ORAL | 3 refills | Status: DC

## 2018-09-02 NOTE — Telephone Encounter (Signed)
Will refill.  He also needs a PCP visit.  Looks like he has only had telehealth or [place] visits for the last few months.  Thanks!

## 2018-09-03 ENCOUNTER — Ambulatory Visit (INDEPENDENT_AMBULATORY_CARE_PROVIDER_SITE_OTHER): Payer: Medicare Other | Admitting: Internal Medicine

## 2018-09-03 ENCOUNTER — Other Ambulatory Visit: Payer: Self-pay | Admitting: *Deleted

## 2018-09-03 ENCOUNTER — Other Ambulatory Visit: Payer: Self-pay

## 2018-09-03 ENCOUNTER — Ambulatory Visit (INDEPENDENT_AMBULATORY_CARE_PROVIDER_SITE_OTHER): Payer: Medicare Other | Admitting: Dietician

## 2018-09-03 ENCOUNTER — Ambulatory Visit: Payer: Medicare Other | Admitting: Dietician

## 2018-09-03 ENCOUNTER — Encounter: Payer: Self-pay | Admitting: Internal Medicine

## 2018-09-03 VITALS — BP 133/72 | HR 85 | Temp 98.2°F | Ht 76.0 in

## 2018-09-03 DIAGNOSIS — Z7901 Long term (current) use of anticoagulants: Secondary | ICD-10-CM | POA: Diagnosis not present

## 2018-09-03 DIAGNOSIS — Z713 Dietary counseling and surveillance: Secondary | ICD-10-CM | POA: Diagnosis not present

## 2018-09-03 DIAGNOSIS — R310 Gross hematuria: Secondary | ICD-10-CM

## 2018-09-03 DIAGNOSIS — R319 Hematuria, unspecified: Secondary | ICD-10-CM

## 2018-09-03 DIAGNOSIS — Z794 Long term (current) use of insulin: Secondary | ICD-10-CM | POA: Diagnosis not present

## 2018-09-03 DIAGNOSIS — Z87440 Personal history of urinary (tract) infections: Secondary | ICD-10-CM

## 2018-09-03 DIAGNOSIS — N390 Urinary tract infection, site not specified: Secondary | ICD-10-CM

## 2018-09-03 DIAGNOSIS — E119 Type 2 diabetes mellitus without complications: Secondary | ICD-10-CM | POA: Diagnosis not present

## 2018-09-03 DIAGNOSIS — N3001 Acute cystitis with hematuria: Secondary | ICD-10-CM

## 2018-09-03 LAB — CBC
HCT: 33.9 % — ABNORMAL LOW (ref 39.0–52.0)
Hemoglobin: 10.7 g/dL — ABNORMAL LOW (ref 13.0–17.0)
MCH: 29.1 pg (ref 26.0–34.0)
MCHC: 31.6 g/dL (ref 30.0–36.0)
MCV: 92.1 fL (ref 80.0–100.0)
Platelets: 281 10*3/uL (ref 150–400)
RBC: 3.68 MIL/uL — ABNORMAL LOW (ref 4.22–5.81)
RDW: 13.9 % (ref 11.5–15.5)
WBC: 9.3 10*3/uL (ref 4.0–10.5)
nRBC: 0 % (ref 0.0–0.2)

## 2018-09-03 LAB — POCT URINALYSIS DIPSTICK
Glucose, UA: NEGATIVE
Ketones, UA: NEGATIVE
Nitrite, UA: NEGATIVE
Protein, UA: POSITIVE — AB
Spec Grav, UA: 1.02 (ref 1.010–1.025)
Urobilinogen, UA: 0.2 E.U./dL
pH, UA: 5.5 (ref 5.0–8.0)

## 2018-09-03 LAB — URINALYSIS, ROUTINE W REFLEX MICROSCOPIC
Bilirubin Urine: NEGATIVE
Glucose, UA: NEGATIVE mg/dL
Ketones, ur: NEGATIVE mg/dL
Nitrite: NEGATIVE
Protein, ur: 100 mg/dL — AB
RBC / HPF: >50 RBC/hpf — ABNORMAL HIGH (ref 0–5)
Specific Gravity, Urine: 1.011 (ref 1.005–1.030)
WBC, UA: >50 WBC/hpf — ABNORMAL HIGH (ref 0–5)
pH: 5 (ref 5.0–8.0)

## 2018-09-03 LAB — BASIC METABOLIC PANEL
Anion gap: 11 (ref 5–15)
BUN: 65 mg/dL — ABNORMAL HIGH (ref 6–20)
CO2: 26 mmol/L (ref 22–32)
Calcium: 9.3 mg/dL (ref 8.9–10.3)
Chloride: 102 mmol/L (ref 98–111)
Creatinine, Ser: 2.55 mg/dL — ABNORMAL HIGH (ref 0.61–1.24)
GFR calc Af Amer: 35 mL/min — ABNORMAL LOW (ref >60)
GFR calc non Af Amer: 30 mL/min — ABNORMAL LOW (ref >60)
Glucose, Bld: 187 mg/dL — ABNORMAL HIGH (ref 70–99)
Potassium: 3.9 mmol/L (ref 3.5–5.1)
Sodium: 139 mmol/L (ref 135–145)

## 2018-09-03 MED ORDER — DOXYCYCLINE MONOHYDRATE 100 MG PO TABS: 100.0000 mg | ORAL_TABLET | Freq: Two times a day (BID) | ORAL | 0 refills | Status: AC

## 2018-09-03 MED ORDER — DOXYCYCLINE HYCLATE 100 MG PO TBEC: 100.0000 mg | DELAYED_RELEASE_TABLET | Freq: Two times a day (BID) | ORAL | 0 refills | Status: DC

## 2018-09-03 NOTE — Patient Instructions (Signed)
Hi [name],  We talked about recipes to help you with "clean eating" today. Hope some work for you.   I recommend keeping recipes simple and few ingredients as possible.   Next week- please bring your prosthesis and paperwork for the bariatric surgery center.   [name] [phone]

## 2018-09-03 NOTE — Patient Instructions (Addendum)
Thank you, [name] for allowing [place] to provide your care today. Today we discussed urinary tract infection.    I have ordered basic metabolic panel and complete metabolic panel labs for you. I will call if any are abnormal.    I have place a referrals to none today.   I have ordered the following tests: Urinalysis with urine culture.  I will call you with the results and if we need to change your antibiotic.  I have ordered the following medication/changed the following medications: Doxycycline 100 mg twice daily for 14 days.  Please follow-up in as needed with your PCP.    Should you have any questions or concerns please call the internal medicine clinic at [phone].    Marianna Payment, D.O. [facility]

## 2018-09-03 NOTE — Progress Notes (Signed)
Medical Nutrition Therapy :  Appt start time: 4259 end time:  1050 Total time:35 minutes Visit # 5  Assessment:  Primary concerns today: nasal glucagon prescription,  ideas for healthy desserts and meals using chicken. Bariatric surgery goal-completed packet, he'll bring to his visit next week   [name] requested a prescription for Baqsimi yesterday. He says his workouts are making his sugar go dangerously low. I asked when this was happening and how'd he know it was dangerously low because the lowest value on his meter for the past 30 days was 72.   I explained to him that Baqsimi is only to be used if unable to take anything by mouth due to side effects of glucagon administration. I also discussed that it is usually given by a family member or friend who is trained in how to use it.   ANTHROPOMETRICS:  Unable to weigh today as he did not bring his prosthesis SLEEP: not discussed today  MEDICATIONS:deferred today  BLOOD SUGAR: He checked 1.6x/day, average is 208mg /dl, standard day shows convex line- average overnight 301, daytime: 184/157/202/184, evening: 244/294, highest 416, lowest 72   DIETARY INTAKE:   We deferred this today  PHYSICAL ACTIVITY: has adjusted insulin for exercise. work outs at home. Reports having problems with low blood sugars during exercise, feels like he needs to do more  Progress Towards Goal(s):  Some progress.   Nutritional Diagnosis:  NB-1.1 Food and nutrition-related knowledge deficit As related to lack of sufficient prior exposure to diabetes meal planning is improving  As evidenced by his blood sugar values and report.    Intervention:  nutrition and diabetes support for evaluating his own progress and what behavior(s) are/is causing the change.   Action Goal: Limit high calorie foods to promote weight loss; take insulin before meals even if he takes half his usual dose because his sugar is lower than he is comfortable  Outcome goal: improved metabolism  and blood sugars Coordination of care:request  discussion/prescription for Baqsimi.  Teaching Method Utilized:  Auditory Handouts given during visit include:avs Barriers to learning/adherence to lifestyle change: competing values/demands Demonstrated degree of understanding via:  Teach Back   Monitoring/Evaluation:  Dietary intake, exercise, meter,prosthesis and body weight in 1 week(s). [name], RD 09/03/2018 2:45 PM.

## 2018-09-03 NOTE — Progress Notes (Deleted)
   CC: ***  HPI:  Mr.Taige Boen is a 43 y.o.   Past Medical History:  Diagnosis Date  . Acute venous embolism and thrombosis of deep vessels of proximal lower extremity ([place]) 07/19/2011  . Anemia   . Chest pain, neg MI, normal coronaries by cath 02/18/2013  . CKD (chronic kidney disease) stage 3, GFR 30-59 ml/min (HCC) 02/19/2013  . Colon polyps   . Diabetic ulcer of right foot ([place])   . DVT (deep venous thrombosis) ([place]) 09/2002   patient reports additional DVTs in '06 & '11 (unconfirmed)  . GERD (gastroesophageal reflux disease)   . History of blood transfusion    "related to OR" (10/31/2016)  . Hyperlipidemia 02/19/2013  . Hypertension   . Nausea & vomiting    "constant for the last couple weeks" (10/31/2016)  . Nephrotic syndrome   . Obesity    BMI 44, weight 346 pounds 01/30/14  . OSA (obstructive sleep apnea) 02/28/2018   Mild obstructive sleep apnea with an AHI of 9.8/h but severe during REM sleep with an AHI of 33.8/h.  Oxygen saturations dropped to 86% and there was moderate snoring  . Prosthesis adjustment 08/17/2016  . Pulmonary embolism ([place]) 09/2002   treated with 6 months of warfarin  . Pyelonephritis 02/02/2018  . Type I diabetes mellitus ([place]) dx'd 2001   Review of Systems:  ***  Physical Exam:  There were no vitals filed for this visit. ***  Assessment & Plan:   See Encounters Tab for problem based charting.  Patient {GC/GE:[id number]::"discussed with","seen with"} Dr. {NAMES:[id number]::"Butcher","Granfortuna","E. Hoffman","Mullen","Narendra","Raines","Vincent"}

## 2018-09-03 NOTE — Telephone Encounter (Signed)
Next appt scheduled 9/29 with PCP. 

## 2018-09-04 DIAGNOSIS — N390 Urinary tract infection, site not specified: Secondary | ICD-10-CM | POA: Insufficient documentation

## 2018-09-04 NOTE — Progress Notes (Signed)
Internal Medicine Clinic Attending  I saw and evaluated the patient.  I personally confirmed the key portions of the history and exam documented by Dr. Coe and I reviewed pertinent patient test results.  The assessment, diagnosis, and plan were formulated together and I agree with the documentation in the resident's note.    

## 2018-09-04 NOTE — Progress Notes (Signed)
   CC: Hematuria  HPI:  [name] is a 43 y.o. male with a past medical history stated below and presents today for hematuria. Please see problem based assessment and plan for additional details.   Past Medical History:  Diagnosis Date  . Acute venous embolism and thrombosis of deep vessels of proximal lower extremity ([place]) 07/19/2011  . Anemia   . Chest pain, neg MI, normal coronaries by cath 02/18/2013  . CKD (chronic kidney disease) stage 3, GFR 30-59 ml/min (HCC) 02/19/2013  . Colon polyps   . Diabetic ulcer of right foot ([place])   . DVT (deep venous thrombosis) ([place]) 09/2002   patient reports additional DVTs in '06 & '11 (unconfirmed)  . GERD (gastroesophageal reflux disease)   . History of blood transfusion    "related to OR" (10/31/2016)  . Hyperlipidemia 02/19/2013  . Hypertension   . Nausea & vomiting    "constant for the last couple weeks" (10/31/2016)  . Nephrotic syndrome   . Obesity    BMI 44, weight 346 pounds 01/30/14  . OSA (obstructive sleep apnea) 02/28/2018   Mild obstructive sleep apnea with an AHI of 9.8/h but severe during REM sleep with an AHI of 33.8/h.  Oxygen saturations dropped to 86% and there was moderate snoring  . Prosthesis adjustment 08/17/2016  . Pulmonary embolism ([place]) 09/2002   treated with 6 months of warfarin  . Pyelonephritis 02/02/2018  . Type I diabetes mellitus ([place]) dx'd 2001     Review of Systems: Review of Systems  Constitutional: Negative for chills, fever and malaise/fatigue.  Gastrointestinal: Negative for abdominal pain.  Genitourinary: Positive for dysuria and hematuria. Negative for flank pain.  Musculoskeletal: Negative for myalgias.     Vitals:   09/03/18 1055  BP: 133/72  Pulse: 85  Temp: 98.2 F (36.8 C)  TempSrc: Oral  SpO2: 98%  Height: 6\' 4"  (1.93 m)     Physical Exam: Physical Exam  Constitutional: He is oriented to person, place, and time.  Cardiovascular: Normal rate, regular rhythm, normal heart  sounds and intact distal pulses. Exam reveals no gallop and no friction rub.  No murmur heard. Abdominal: Soft. Bowel sounds are normal. He exhibits no distension. There is no abdominal tenderness. There is no rebound, no guarding and no CVA tenderness.  Neurological: He is alert and oriented to person, place, and time.     Assessment & Plan:   See Encounters Tab for problem based charting.  Patient seen with Dr. Evette Doffing

## 2018-09-04 NOTE — Assessment & Plan Note (Addendum)
Patient presents with a one-week history of dull pain with urination and subjective blood from the urethral meatus on toilet tissue.  Patient denies frank blood, pain that radiated to the groin or back.  He denies CVA tenderness.  The patient already has a urology follow-up appointment set up for 9/02 for unexplained recurrent UTIs.  Patient's most recent infection grew Klebsiella that was sensitive to doxycycline.  Plan: - Ordered urinalysis with culture. -We will empirically treat with doxycycline 100 mg twice daily for 14 days. - We will continue the patient's Eliquis at this time due to absence of gross hematuria. - Order CBC to assess his hemaglobin for acute bleeding. - Order BMP to assess kidney function.

## 2018-09-05 ENCOUNTER — Ambulatory Visit (HOSPITAL_COMMUNITY)
Admission: RE | Admit: 2018-09-05 | Discharge: 2018-09-05 | Disposition: A | Discharge status: Home / Self Care | Payer: Medicare Other | Source: Ambulatory Visit | Attending: Nephrology | Admitting: Nephrology

## 2018-09-05 VITALS — BP 163/85 | HR 90 | Temp 96.5°F | Resp 18

## 2018-09-05 DIAGNOSIS — D631 Anemia in chronic kidney disease: Secondary | ICD-10-CM | POA: Insufficient documentation

## 2018-09-05 DIAGNOSIS — N183 Chronic kidney disease, stage 3 unspecified: Secondary | ICD-10-CM

## 2018-09-05 LAB — FERRITIN: Ferritin: 505 ng/mL — ABNORMAL HIGH (ref 24–336)

## 2018-09-05 LAB — POCT HEMOGLOBIN-HEMACUE: Hemoglobin: 9.7 g/dL — ABNORMAL LOW (ref 13.0–17.0)

## 2018-09-05 LAB — IRON AND TIBC
Iron: 51 ug/dL (ref 45–182)
Saturation Ratios: 19 % (ref 17.9–39.5)
TIBC: 272 ug/dL (ref 250–450)
UIBC: 221 ug/dL

## 2018-09-05 MED ORDER — ALLOPURINOL 100 MG PO TABS: 200.0000 mg | ORAL_TABLET | Freq: Every day | ORAL | 3 refills | Status: DC

## 2018-09-05 MED ORDER — DARBEPOETIN ALFA 100 MCG/0.5ML IJ SOSY
PREFILLED_SYRINGE | INTRAMUSCULAR | Status: AC
  Administered 2018-09-05: 100 ug via SUBCUTANEOUS
  Filled 2018-09-05: qty 0.5

## 2018-09-05 MED ORDER — CARVEDILOL 25 MG PO TABS: 25.0000 mg | ORAL_TABLET | Freq: Two times a day (BID) | ORAL | 0 refills | Status: DC

## 2018-09-05 MED ORDER — CLONIDINE HCL 0.3 MG PO TABS: 0.3000 mg | ORAL_TABLET | Freq: Two times a day (BID) | ORAL | 3 refills | Status: DC

## 2018-09-05 MED ORDER — DARBEPOETIN ALFA 100 MCG/0.5ML IJ SOSY
100.0000 ug | PREFILLED_SYRINGE | INTRAMUSCULAR | Status: DC
  Administered 2018-09-05: 15:00:00 100 ug via SUBCUTANEOUS

## 2018-09-05 MED ORDER — FUROSEMIDE 80 MG PO TABS: 80.0000 mg | ORAL_TABLET | Freq: Two times a day (BID) | ORAL | 1 refills | Status: DC

## 2018-09-05 NOTE — Progress Notes (Signed)
Patient called.  Patient aware.  I told him to call us back if his symptoms do not resolve.  He has a follow-up with his urologist in the beginning of September.

## 2018-09-06 LAB — URINE CULTURE: Culture: >=100000 — AB

## 2018-09-09 ENCOUNTER — Encounter: Payer: Self-pay | Admitting: Dietician

## 2018-09-09 ENCOUNTER — Ambulatory Visit: Payer: Medicare Other | Admitting: Dietician

## 2018-09-11 ENCOUNTER — Other Ambulatory Visit: Payer: Self-pay | Admitting: Urology

## 2018-09-11 ENCOUNTER — Other Ambulatory Visit (HOSPITAL_COMMUNITY): Payer: Self-pay | Admitting: Urology

## 2018-09-11 DIAGNOSIS — R3989 Other symptoms and signs involving the genitourinary system: Secondary | ICD-10-CM | POA: Diagnosis not present

## 2018-09-11 DIAGNOSIS — R8271 Bacteriuria: Secondary | ICD-10-CM | POA: Diagnosis not present

## 2018-09-11 DIAGNOSIS — N318 Other neuromuscular dysfunction of bladder: Secondary | ICD-10-CM | POA: Diagnosis not present

## 2018-09-11 DIAGNOSIS — R102 Pelvic and perineal pain: Secondary | ICD-10-CM | POA: Diagnosis not present

## 2018-09-18 ENCOUNTER — Other Ambulatory Visit: Payer: Self-pay

## 2018-09-18 ENCOUNTER — Ambulatory Visit (HOSPITAL_COMMUNITY)
Admission: RE | Admit: 2018-09-18 | Discharge: 2018-09-18 | Disposition: A | Discharge status: Home / Self Care | Payer: Medicare Other | Source: Ambulatory Visit | Attending: Urology | Admitting: Urology

## 2018-09-18 ENCOUNTER — Encounter (HOSPITAL_COMMUNITY): Payer: Self-pay

## 2018-09-18 DIAGNOSIS — R102 Pelvic and perineal pain: Secondary | ICD-10-CM | POA: Insufficient documentation

## 2018-09-18 DIAGNOSIS — K5900 Constipation, unspecified: Secondary | ICD-10-CM | POA: Diagnosis not present

## 2018-09-19 ENCOUNTER — Encounter: Payer: Self-pay | Admitting: Dietician

## 2018-09-19 ENCOUNTER — Ambulatory Visit (INDEPENDENT_AMBULATORY_CARE_PROVIDER_SITE_OTHER): Payer: Medicare Other | Admitting: Dietician

## 2018-09-19 ENCOUNTER — Other Ambulatory Visit: Payer: Self-pay | Admitting: Endocrinology

## 2018-09-19 DIAGNOSIS — Z794 Long term (current) use of insulin: Secondary | ICD-10-CM

## 2018-09-19 DIAGNOSIS — E119 Type 2 diabetes mellitus without complications: Secondary | ICD-10-CM | POA: Diagnosis not present

## 2018-09-19 LAB — POCT GLYCOSYLATED HEMOGLOBIN (HGB A1C): Hemoglobin A1C: 6.9 % — AB (ref 4.0–5.6)

## 2018-09-19 LAB — GLUCOSE, CAPILLARY: Glucose-Capillary: 183 mg/dL — ABNORMAL HIGH (ref 70–99)

## 2018-09-20 DIAGNOSIS — N2581 Secondary hyperparathyroidism of renal origin: Secondary | ICD-10-CM | POA: Diagnosis not present

## 2018-09-20 DIAGNOSIS — Z89619 Acquired absence of unspecified leg above knee: Secondary | ICD-10-CM | POA: Diagnosis not present

## 2018-09-20 DIAGNOSIS — D631 Anemia in chronic kidney disease: Secondary | ICD-10-CM | POA: Diagnosis not present

## 2018-09-20 DIAGNOSIS — I129 Hypertensive chronic kidney disease with stage 1 through stage 4 chronic kidney disease, or unspecified chronic kidney disease: Secondary | ICD-10-CM | POA: Diagnosis not present

## 2018-09-20 DIAGNOSIS — Z23 Encounter for immunization: Secondary | ICD-10-CM | POA: Diagnosis not present

## 2018-09-20 DIAGNOSIS — N183 Chronic kidney disease, stage 3 (moderate): Secondary | ICD-10-CM | POA: Diagnosis not present

## 2018-09-20 DIAGNOSIS — R310 Gross hematuria: Secondary | ICD-10-CM | POA: Diagnosis not present

## 2018-09-20 DIAGNOSIS — Z794 Long term (current) use of insulin: Secondary | ICD-10-CM | POA: Diagnosis not present

## 2018-09-20 DIAGNOSIS — E1122 Type 2 diabetes mellitus with diabetic chronic kidney disease: Secondary | ICD-10-CM | POA: Diagnosis not present

## 2018-09-20 DIAGNOSIS — R3915 Urgency of urination: Secondary | ICD-10-CM | POA: Diagnosis not present

## 2018-09-20 NOTE — Progress Notes (Signed)
Documentation: Assisted patient with bariatric surgery application.  Meter was downloaded and is without significant change. The 30 day average is 207, range is 86-445,  pattern shows in target (fasting average 132,  lunch 187, dinner 179) during daytime and mostly above target in the evening and overnight( averages 280, 293, 310 and 202). patient reports his eating habits are alterd due to increased stress with heavy scholastic online load.  He has not used the exercise reducation.  He did not bring his prosthesis to weigh today.  P follow up next week per patient request.  Debera Lat, RD 09/20/2018 5:53 PM.

## 2018-09-24 ENCOUNTER — Other Ambulatory Visit: Payer: Self-pay | Admitting: *Deleted

## 2018-09-24 MED ORDER — BD PEN NEEDLE MINI U/F 31G X 5 MM MISC: 7 refills | Status: DC

## 2018-09-24 MED ORDER — LOVASTATIN 20 MG PO TABS: 20.0000 mg | ORAL_TABLET | Freq: Every day | ORAL | 3 refills | Status: DC

## 2018-09-25 ENCOUNTER — Ambulatory Visit: Payer: Medicare Other | Admitting: Dietician

## 2018-10-01 ENCOUNTER — Other Ambulatory Visit: Payer: Self-pay | Admitting: Internal Medicine

## 2018-10-01 DIAGNOSIS — Z794 Long term (current) use of insulin: Secondary | ICD-10-CM

## 2018-10-01 DIAGNOSIS — E119 Type 2 diabetes mellitus without complications: Secondary | ICD-10-CM

## 2018-10-02 NOTE — Telephone Encounter (Signed)
[name] Novolog mealtime insulin doses are 20 units with breakfast,  25 units with lunch and 30 units with dinner for a total of 75 units per day. He will need 8 pens(22.5mL) if they break boxes or 2 boxes(30 mL) if they do not per month.

## 2018-10-03 ENCOUNTER — Other Ambulatory Visit: Payer: Self-pay

## 2018-10-03 ENCOUNTER — Ambulatory Visit (HOSPITAL_COMMUNITY)
Admission: RE | Admit: 2018-10-03 | Discharge: 2018-10-03 | Disposition: A | Discharge status: Home / Self Care | Payer: Medicare Other | Source: Ambulatory Visit | Attending: Nephrology | Admitting: Nephrology

## 2018-10-03 VITALS — BP 164/92 | HR 93 | Temp 97.5°F | Resp 18

## 2018-10-03 DIAGNOSIS — D631 Anemia in chronic kidney disease: Secondary | ICD-10-CM | POA: Insufficient documentation

## 2018-10-03 DIAGNOSIS — N183 Chronic kidney disease, stage 3 unspecified: Secondary | ICD-10-CM

## 2018-10-03 LAB — POCT HEMOGLOBIN-HEMACUE: Hemoglobin: 10.3 g/dL — ABNORMAL LOW (ref 13.0–17.0)

## 2018-10-03 LAB — IRON AND TIBC
Iron: 61 ug/dL (ref 45–182)
Saturation Ratios: 21 % (ref 17.9–39.5)
TIBC: 290 ug/dL (ref 250–450)
UIBC: 229 ug/dL

## 2018-10-03 LAB — FERRITIN: Ferritin: 441 ng/mL — ABNORMAL HIGH (ref 24–336)

## 2018-10-03 MED ORDER — DARBEPOETIN ALFA 100 MCG/0.5ML IJ SOSY
100.0000 ug | PREFILLED_SYRINGE | INTRAMUSCULAR | Status: DC
  Administered 2018-10-03: 100 ug via SUBCUTANEOUS

## 2018-10-03 MED ORDER — DARBEPOETIN ALFA 100 MCG/0.5ML IJ SOSY
PREFILLED_SYRINGE | INTRAMUSCULAR | Status: AC
  Filled 2018-10-03: qty 0.5

## 2018-10-04 DIAGNOSIS — E119 Type 2 diabetes mellitus without complications: Secondary | ICD-10-CM | POA: Diagnosis not present

## 2018-10-04 DIAGNOSIS — I8290 Acute embolism and thrombosis of unspecified vein: Secondary | ICD-10-CM | POA: Diagnosis not present

## 2018-10-04 DIAGNOSIS — S88919A Complete traumatic amputation of unspecified lower leg, level unspecified, initial encounter: Secondary | ICD-10-CM | POA: Diagnosis not present

## 2018-10-04 MED ORDER — INSULIN ASPART 100 UNIT/ML FLEXPEN: 25.0000 [IU] | PEN_INJECTOR | Freq: Three times a day (TID) | SUBCUTANEOUS | 11 refills | Status: DC

## 2018-10-07 ENCOUNTER — Other Ambulatory Visit (HOSPITAL_COMMUNITY): Payer: Self-pay | Admitting: General Surgery

## 2018-10-07 ENCOUNTER — Other Ambulatory Visit: Payer: Self-pay | Admitting: General Surgery

## 2018-10-08 ENCOUNTER — Ambulatory Visit: Payer: Medicare Other | Admitting: Dietician

## 2018-10-08 ENCOUNTER — Telehealth: Payer: Self-pay | Admitting: Internal Medicine

## 2018-10-08 ENCOUNTER — Encounter: Payer: Medicare Other | Admitting: Internal Medicine

## 2018-10-08 ENCOUNTER — Encounter: Payer: Self-pay | Admitting: Dietician

## 2018-10-10 ENCOUNTER — Ambulatory Visit (HOSPITAL_COMMUNITY)
Admission: RE | Admit: 2018-10-10 | Discharge: 2018-10-10 | Disposition: A | Discharge status: Home / Self Care | Payer: Medicare Other | Source: Ambulatory Visit | Attending: General Surgery | Admitting: General Surgery

## 2018-10-10 ENCOUNTER — Other Ambulatory Visit: Payer: Self-pay

## 2018-10-16 ENCOUNTER — Telehealth: Payer: Self-pay | Admitting: Internal Medicine

## 2018-10-16 NOTE — Telephone Encounter (Signed)
Pt has concerns about COVID-19 and is requesting to get a test as soon as possible. Pt has had a low grade fever since yesterday 99.3. Pt. Reports he also threw up and had a bad headache yesterday.  Pt reports he is still unable to hold anything down today and has taken his temp again and is 99.5. Please call the patient back.

## 2018-10-17 NOTE — Telephone Encounter (Signed)
Spoke with [name] last night at 9:00 pm. He stated that his symptoms have resolved. Spoke with patient about symptoms related to COVID and the need to go to the ED if he expresses symptoms.

## 2018-10-22 ENCOUNTER — Telehealth: Payer: Self-pay | Admitting: *Deleted

## 2018-10-22 NOTE — Progress Notes (Signed)
Virtual Visit via Telephone Note   This visit type was conducted due to national recommendations for restrictions regarding the COVID-19 Pandemic (e.g. social distancing) in an effort to limit this patient's exposure and mitigate transmission in our community.  Due to his co-morbid illnesses, this patient is at least at moderate risk for complications without adequate follow up.  This format is felt to be most appropriate for this patient at this time.  All issues noted in this document were discussed and addressed.  A limited physical exam was performed with this format.  Please refer to the patient's chart for his consent to telehealth for [facility].   Evaluation Performed:  Follow-up visit  This visit type was conducted due to national recommendations for restrictions regarding the COVID-19 Pandemic (e.g. social distancing).  This format is felt to be most appropriate for this patient at this time.  All issues noted in this document were discussed and addressed.  No physical exam was performed (except for noted visual exam findings with Video Visits).  Please refer to the patient's chart (MyChart message for video visits and phone note for telephone visits) for the patient's consent to telehealth for [facility].  Date:  10/23/2018   ID:  [name], DOB 06-21-1975, MRN [id number]  Patient Location:  Home  Provider location:   [place]  PCP:  [name], MD  Cardiologist:  Fransico Him, MD Electrophysiologist:  None   Chief Complaint:  CHF, HLD, OSA  History of Present Illness:    [name] is a 43 y.o. male who presents via audio/video conferencing for a telehealth visit today.    This is a 43yo male with a history of DCV/PE, CKD stage 3, DM, HTN, hyperlipidemia and morbid obesity weighing around 400 pounds.  He has a history of chronic diastolic CHF and was recently diagnosed with obstructive sleep apnea.  His Epworth sleepiness score was 8.  Sleep study done  11/06/2017 showed overall mild obstructive sleep apnea with an AHI of 9.8/h but severe during REM sleep with an AHI of 33.8/h.  Oxygen saturations dropped to 86% and there was moderate snoring.  He underwent CPAP titration to 16cm H2O.    He is here today for followup and is doing well.  He denies any chest pain or pressure, SOB, DOE, PND, orthopnea, dizziness, palpitations or syncope. He has sporadic LE edema but he elevates his feet during the day and at night which helps a lot. He is compliant with his meds and is tolerating meds with no SE.  He is doing well with his CPAP device and thinks that he has gotten used to it.  He tolerates the Full face mask and feels the pressure is adequate.  Since going on CPAP he feels rested in the am and has no significant daytime sleepiness.  He is having a lot of mouth but has not adjusted the humidity dial.  He does not think that he snores.    The patient does not have symptoms concerning for COVID-19 infection (fever, chills, cough, or new shortness of breath).   Prior CV studies:   The following studies were reviewed today:  CPAP titratoin and sleep study  Past Medical History:  Diagnosis Date  . Acute venous embolism and thrombosis of deep vessels of proximal lower extremity ([place]) 07/19/2011  . Anemia   . Chest pain, neg MI, normal coronaries by cath 02/18/2013  . CKD (chronic kidney disease) stage 3, GFR 30-59 ml/min (HCC) 02/19/2013  .  Colon polyps   . Diabetic ulcer of right foot ([place])   . DVT (deep venous thrombosis) ([place]) 09/2002   patient reports additional DVTs in '06 & '11 (unconfirmed)  . GERD (gastroesophageal reflux disease)   . History of blood transfusion    "related to OR" (10/31/2016)  . Hyperlipidemia 02/19/2013  . Hypertension   . Nausea & vomiting    "constant for the last couple weeks" (10/31/2016)  . Nephrotic syndrome   . Obesity    BMI 44, weight 346 pounds 01/30/14  . OSA (obstructive sleep apnea) 02/28/2018   Mild  obstructive sleep apnea with an AHI of 9.8/h but severe during REM sleep with an AHI of 33.8/h.  Oxygen saturations dropped to 86% and there was moderate snoring  . Prosthesis adjustment 08/17/2016  . Pulmonary embolism ([place]) 09/2002   treated with 6 months of warfarin  . Pyelonephritis 02/02/2018  . Type I diabetes mellitus ([place]) dx'd 2001   Past Surgical History:  Procedure Laterality Date  . AMPUTATION Right 12/22/2013   Procedure: AMPUTATION BELOW KNEE;  Surgeon: [name], MD;  Location: [place];  Service: Orthopedics;  Laterality: Right;  . AMPUTATION Right 02/19/2014   Procedure: RIGHT ABOVE KNEE AMPUTATION ;  Surgeon: [name], MD;  Location: [place];  Service: Orthopedics;  Laterality: Right;  . CARDIAC CATHETERIZATION  02/18/2013   normal coronaries  . ESOPHAGOGASTRODUODENOSCOPY N/A 11/02/2016   Procedure: ESOPHAGOGASTRODUODENOSCOPY (EGD);  Surgeon: [name], MD;  Location[facility] ENDOSCOPY;  Service: Endoscopy;  Laterality: N/A;  . I&D EXTREMITY Right 12/14/2013   Procedure: IRRIGATION AND DEBRIDEMENT RIGHT FOOT;  Surgeon: [name], MD;  Location: [place];  Service: Orthopedics;  Laterality: Right;  . INCISION AND DRAINAGE ABSCESS  2007; 2015   "back"  . INCISION AND DRAINAGE OF WOUND Right 12/22/2013   Procedure: I&D RIGHT BUTTOCK;  Surgeon: [name], MD;  Location: [place];  Service: Orthopedics;  Laterality: Right;  . LEFT HEART CATHETERIZATION WITH CORONARY ANGIOGRAM N/A 02/18/2013   Procedure: LEFT HEART CATHETERIZATION WITH CORONARY ANGIOGRAM;  Surgeon: [name], MD;  Location: [facility] CATH LAB;  Service: Cardiovascular;  Laterality: N/A;     Current Meds  Medication Sig  . ACCU-CHEK SOFTCLIX LANCETS lancets USE TO CHECK BLOOD SUGAR 5 TIMES DAILY. DX CODE E11.9.  . allopurinol (ZYLOPRIM) 100 MG tablet Take 2 tablets (200 mg total) by mouth daily.  . Alpha-Lipoic Acid 200 MG CAPS Take 1 capsule (200 mg total) by mouth daily.  Marland Kitchen apixaban (ELIQUIS) 5 MG TABS tablet  Take 1 tablet (5 mg total) by mouth 2 (two) times daily.  Marland Kitchen aspirin EC 81 MG tablet Take 81 mg by mouth as needed for mild pain (or headaches).   . calcitRIOL (ROCALTROL) 0.25 MCG capsule Take 1 capsule by mouth once daily  . carvedilol (COREG) 25 MG tablet Take 1 tablet (25 mg total) by mouth 2 (two) times daily.  . cloNIDine (CATAPRES) 0.3 MG tablet Take 1 tablet (0.3 mg total) by mouth 2 (two) times daily.  . Cyanocobalamin (B-12) 2500 MCG SUBL Place 2,500 mcg under the tongue daily.  . famotidine (PEPCID) 20 MG tablet Take 1 tablet (20 mg total) by mouth 2 (two) times daily.  . furosemide (LASIX) 80 MG tablet Take 1 tablet (80 mg total) by mouth 2 (two) times daily.  Marland Kitchen gabapentin (NEURONTIN) 100 MG capsule Take 1 capsule (100 mg total) by mouth 3 (three) times daily.  Marland Kitchen glucose blood test strip USE AS  INSTRUCTED TO CHECK BLOOD SUGARS THREE TIMES DAILY  . hydrALAZINE (APRESOLINE) 50 MG tablet Take 50 mg by mouth 4 (four) times daily.  . insulin aspart (NOVOLOG) 100 UNIT/ML FlexPen Inject 25 Units into the skin 3 (three) times daily with meals. 20 unit morning, 25 units lunch 30 units for dinner  . insulin aspart (NOVOLOG) 100 UNIT/ML injection Inject 25 Units into the skin 3 (three) times daily before meals.  . Insulin Degludec 200 UNIT/ML SOPN Inject 60 Units into the skin daily. (Patient taking differently: Inject 80 Units into the skin daily before breakfast. )  . Insulin Pen Needle (B-D UF III MINI PEN NEEDLES) 31G X 5 MM MISC USE  4 TIMES DAILY  . Insulin Pen Needle (PEN NEEDLES 3/16") 31G X 5 MM MISC Use five times daily  . Lancet Devices (ACCU-CHEK SOFTCLIX) lancets Use to check blood sugars 5 times a day. Dx code:E11.9. Insulin dependent.  . lovastatin (MEVACOR) 20 MG tablet Take 1 tablet (20 mg total) by mouth daily.  . metolazone (ZAROXOLYN) 2.5 MG tablet Take 2.5 mg by mouth every other day.  . Omega-3 Fatty Acids (FISH OIL PO) Take 1 capsule by mouth at bedtime.  . ondansetron  (ZOFRAN ODT) 4 MG disintegrating tablet Take 1 tablet (4 mg total) by mouth every 8 (eight) hours as needed for nausea or vomiting.  Marland Kitchen oxybutynin (DITROPAN) 5 MG tablet Take 5 mg by mouth 3 (three) times daily.  Marland Kitchen oxyCODONE-acetaminophen (PERCOCET/ROXICET) 5-325 MG tablet Take 1 tablet by mouth every 6 (six) hours as needed for severe pain.  . SUMAtriptan (IMITREX) 50 MG tablet Take 1 tablet (50 mg total) by mouth every 2 (two) hours as needed for migraine. May repeat in 2 hours if headache persists or recurs.  . tamsulosin (FLOMAX) 0.4 MG CAPS capsule Take 0.4 mg by mouth at bedtime.   Marland Kitchen VICTOZA 18 MG/3ML SOPN INJECT 1.2 MG (0.2 MLS) INTO THE SKIN ONCE DAILY AT THE SAME TIME.  Marland Kitchen zinc sulfate 220 (50 Zn) MG capsule Take 220 mg by mouth daily.     Allergies:   Reglan [metoclopramide]   Social History   Tobacco Use  . Smoking status: Never Smoker  . Smokeless tobacco: Never Used  Substance Use Topics  . Alcohol use: Yes    Comment: 10/31/2016 "a few beers q 6 months or so"  . Drug use: No     Family Hx: The patient's family history includes Diabetes in his cousin, maternal uncle, and mother; Heart disease in his mother.  ROS:   Please see the history of present illness.     All other systems reviewed and are negative.   Labs/Other Tests and Data Reviewed:    Recent Labs: 02/02/2018: B Natriuretic Peptide 461.9 02/10/2018: Magnesium 2.6 05/07/2018: ALT 18 09/03/2018: BUN 65; Creatinine, Ser 2.55; Platelets 281; Potassium 3.9; Sodium 139 10/03/2018: Hemoglobin 10.3   Recent Lipid Panel Lab Results  Component Value Date/Time   CHOL 150 06/05/2017 08:40 AM   TRIG 127.0 06/05/2017 08:40 AM   HDL 33.30 (L) 06/05/2017 08:40 AM   CHOLHDL 4 06/05/2017 08:40 AM   LDLCALC 91 06/05/2017 08:40 AM    Wt Readings from Last 3 Encounters:  10/23/18 (!) 370 lb (167.8 kg)  07/24/18 (!) 369 lb 4.8 oz (167.5 kg)  06/06/18 (!) 364 lb (165.1 kg)     Objective:    Vital Signs:  Ht 6\' 2"   (1.88 m)   Wt (!) 370 lb (167.8 kg)  BMI 47.51 kg/m     ASSESSMENT & PLAN:    1.  OSA - The patient is tolerating PAP therapy well without any problems. The PAP download was reviewed today and showed an AHI of 4/hr on 16 cm H2O with 3% compliance in using more than 4 hours nightly.  The patient has been using and benefiting from PAP use and will continue to benefit from therapy. I have encouraged him to be more compliant with his device. He had some issues with his filter and had to purchase another one out of pocket so it took him a while to get the filter due to cost.  That is why his compliance was low.  He is not back using it.  I will repeat a download in 2 weeks.  2.  Chronic diastolic CHF -he denies any SOB  -he has chronic LE edema which is stable -weight is stable -Continue Lasix 80mg  BID  3.  Morbid Obesity -he is moving forward with weight loss surgery and has decided to start a walking program to help get in better shape  4.  CKD stage 3 -baseline creatinine is 2.1-2.2 -Creatinine was as high as 3.51 in April but last check in August was 2.55. -followed by PCP  5.  HTN -continue Carvedilol 25mg  BID, Hydralazine 50mg  QID, Clonidine 0.3mg  BID   COVID-19 Education: The signs and symptoms of COVID-19 were discussed with the patient and how to seek care for testing (follow up with PCP or arrange E-visit).  The importance of social distancing was discussed today.  Patient Risk:   After full review of this patient's clinical status, I feel that they are at least moderate risk at this time.  Time:   Today, I have spent 20 minutes directly with the patient on telemedicine discussing medical problems including OSA HTN, CHF, obesity.  We also reviewed the symptoms of COVID 19 and the ways to protect against contracting the virus with telehealth technology.  I spent an additional 5 minutes reviewing patient's chart including sleep study, CPAP titration and PAP compliance download.   Medication Adjustments/Labs and Tests Ordered: Current medicines are reviewed at length with the patient today.  Concerns regarding medicines are outlined above.  Tests Ordered: No orders of the defined types were placed in this encounter.  Medication Changes: No orders of the defined types were placed in this encounter.   Disposition:  Follow up in 1 year(s)  Signed, Fransico Him, MD  10/23/2018 9:34 AM    [facility]

## 2018-10-22 NOTE — Telephone Encounter (Signed)
Patient agrees to telephone and meds reviewed  Virtual Visit Pre-Appointment Phone Call  , I am calling you today to discuss your upcoming appointment. We are currently trying to limit exposure to the virus that causes COVID-19 by seeing patients at home rather than in the office."  1. "What is the BEST phone number to call the day of the visit?" - include this in appointment notes  2. "Do you have or have access to (through a family member/friend) a smartphone with video capability that we can use for your visit?" a. If yes - list this number in appt notes as "cell" (if different from BEST phone #) and list the appointment type as a VIDEO visit in appointment notes b. If no - list the appointment type as a PHONE visit in appointment notes  3. Confirm consent - "In the setting of the current Covid19 crisis, you are scheduled for a (phone or video) visit with your provider on (date) at (time).  Just as we do with many in-office visits, in order for you to participate in this visit, we must obtain consent.  If you'd like, I can send this to your mychart (if signed up) or email for you to review.  Otherwise, I can obtain your verbal consent now.  All virtual visits are billed to your insurance company just like a normal visit would be.  By agreeing to a virtual visit, we'd like you to understand that the technology does not allow for your provider to perform an examination, and thus may limit your provider's ability to fully assess your condition. If your provider identifies any concerns that need to be evaluated in person, we will make arrangements to do so.  Finally, though the technology is pretty good, we cannot assure that it will always work on either your or our end, and in the setting of a video visit, we may have to convert it to a phone-only visit.  In either situation, we cannot ensure that we have a secure connection.  Are you willing to proceed?" STAFF: Did the patient verbally acknowledge  consent to telehealth visit? Document YES/NO here: YES   4. Advise patient to be prepared - "Two hours prior to your appointment, go ahead and check your blood pressure, pulse, oxygen saturation, and your weight (if you have the equipment to check those) and write them all down. When your visit starts, your provider will ask you for this information. If you have an Apple Watch or Kardia device, please plan to have heart rate information ready on the day of your appointment. Please have a pen and paper handy nearby the day of the visit as well."  5. Give patient instructions for MyChart download to smartphone OR Doximity/Doxy.me as below if video visit (depending on what platform provider is using)  6. Inform patient they will receive a phone call 15 minutes prior to their appointment time (may be from unknown caller ID) so they should be prepared to answer    [facility] has been deemed a candidate for a follow-up tele-health visit to limit community exposure during the Covid-19 pandemic. I spoke with the patient via phone to ensure availability of phone/video source, confirm preferred email & phone number, and discuss instructions and expectations.  I reminded [name] to be prepared with any vital sign and/or heart rhythm information that could potentially be obtained via home monitoring, at the time of his visit. I reminded [name] to expect a  phone call prior to his visit.  [name], [place] 10/22/2018 3:11 PM   INSTRUCTIONS FOR DOWNLOADING THE MYCHART APP TO SMARTPHONE  - The patient must first make sure to have activated MyChart and know their login information - If Apple, go to [employer] and type in MyChart in the search bar and download the app. If Android, ask patient to go to [employer] and type in [facility] in the search bar and download the app. The app is free but as with any other app downloads, their phone may require them to verify saved  payment information or Apple/Android password.  - The patient will need to then log into the app with their MyChart username and password, and select [facility] as their healthcare provider to link the account. When it is time for your visit, go to the MyChart app, find appointments, and click Begin Video Visit. Be sure to Select Allow for your device to access the Microphone and Camera for your visit. You will then be connected, and your provider will be with you shortly.  **If they have any issues connecting, or need assistance please contact MyChart service desk [phone])**  **If using a computer, in order to ensure the best quality for their visit they will need to use either of the following Internet Browsers: [employer], or Google Chrome**  IF USING DOXIMITY or DOXY.ME - The patient will receive a link just prior to their visit by text.     FULL LENGTH CONSENT FOR TELE-HEALTH VISIT   I hereby voluntarily request, consent and authorize [facility] and its employed or contracted physicians, physician assistants, nurse practitioners or other licensed health care professionals (the Practitioner), to provide me with telemedicine health care services (the "Services") as deemed necessary by the treating Practitioner. I acknowledge and consent to receive the Services by the Practitioner via telemedicine. I understand that the telemedicine visit will involve communicating with the Practitioner through live audiovisual communication technology and the disclosure of certain medical information by electronic transmission. I acknowledge that I have been given the opportunity to request an in-person assessment or other available alternative prior to the telemedicine visit and am voluntarily participating in the telemedicine visit.  I understand that I have the right to withhold or withdraw my consent to the use of telemedicine in the course of my care at any time, without affecting  my right to future care or treatment, and that the Practitioner or I may terminate the telemedicine visit at any time. I understand that I have the right to inspect all information obtained and/or recorded in the course of the telemedicine visit and may receive copies of available information for a reasonable fee.  I understand that some of the potential risks of receiving the Services via telemedicine include:  Marland Kitchen Delay or interruption in medical evaluation due to technological equipment failure or disruption; . Information transmitted may not be sufficient (e.g. poor resolution of images) to allow for appropriate medical decision making by the Practitioner; and/or  . In rare instances, security protocols could fail, causing a breach of personal health information.  Furthermore, I acknowledge that it is my responsibility to provide information about my medical history, conditions and care that is complete and accurate to the best of my ability. I acknowledge that Practitioner's advice, recommendations, and/or decision may be based on factors not within their control, such as incomplete or inaccurate data provided by me or distortions of diagnostic images or specimens that may result  from electronic transmissions. I understand that the practice of medicine is not an exact science and that Practitioner makes no warranties or guarantees regarding treatment outcomes. I acknowledge that I will receive a copy of this consent concurrently upon execution via email to the email address I last provided but may also request a printed copy by calling the office of [facility].    I understand that my insurance will be billed for this visit.   I have read or had this consent read to me. . I understand the contents of this consent, which adequately explains the benefits and risks of the Services being provided via telemedicine.  . I have been provided ample opportunity to ask questions regarding this consent and the  Services and have had my questions answered to my satisfaction. . I give my informed consent for the services to be provided through the use of telemedicine in my medical care  By participating in this telemedicine visit I agree to the above.

## 2018-10-23 ENCOUNTER — Other Ambulatory Visit: Payer: Self-pay

## 2018-10-23 ENCOUNTER — Telehealth: Payer: Self-pay | Admitting: *Deleted

## 2018-10-23 ENCOUNTER — Telehealth (INDEPENDENT_AMBULATORY_CARE_PROVIDER_SITE_OTHER): Payer: Medicare Other | Admitting: Cardiology

## 2018-10-23 VITALS — Ht 74.0 in | Wt 370.0 lb

## 2018-10-23 DIAGNOSIS — I5032 Chronic diastolic (congestive) heart failure: Secondary | ICD-10-CM

## 2018-10-23 DIAGNOSIS — N183 Chronic kidney disease, stage 3 unspecified: Secondary | ICD-10-CM | POA: Diagnosis not present

## 2018-10-23 DIAGNOSIS — I10 Essential (primary) hypertension: Secondary | ICD-10-CM

## 2018-10-23 DIAGNOSIS — I130 Hypertensive heart and chronic kidney disease with heart failure and stage 1 through stage 4 chronic kidney disease, or unspecified chronic kidney disease: Secondary | ICD-10-CM | POA: Diagnosis not present

## 2018-10-23 DIAGNOSIS — G4733 Obstructive sleep apnea (adult) (pediatric): Secondary | ICD-10-CM | POA: Diagnosis not present

## 2018-10-23 NOTE — Telephone Encounter (Signed)
Reminder has been set to get a d/l in 2 weeks.

## 2018-10-23 NOTE — Telephone Encounter (Signed)
-----   Message from Sueanne Margarita, MD sent at 10/23/2018  9:43 AM EDT ----- Please get download in 2 weeks and followup with me in 1 year

## 2018-10-23 NOTE — Patient Instructions (Signed)
Medication Instructions:  Your physician recommends that you continue on your current medications as directed. Please refer to the Current Medication list given to you today.  If you need a refill on your cardiac medications before your next appointment, please call your pharmacy.   Lab work: none If you have labs (blood work) drawn today and your tests are completely normal, you will receive your results only by: Marland Kitchen MyChart Message (if you have MyChart) OR . A paper copy in the mail If you have any lab test that is abnormal or we need to change your treatment, we will call you to review the results.  Testing/Procedures: none  Follow-Up: Your physician wants you to follow-up in: 12 months with Dr Radford Pax.  You will receive a reminder letter in the mail two months in advance. If you don't receive a letter, please call our office to schedule the follow-up appointment.

## 2018-10-25 ENCOUNTER — Other Ambulatory Visit: Payer: Self-pay | Admitting: *Deleted

## 2018-10-25 DIAGNOSIS — E119 Type 2 diabetes mellitus without complications: Secondary | ICD-10-CM | POA: Diagnosis not present

## 2018-10-25 DIAGNOSIS — I8290 Acute embolism and thrombosis of unspecified vein: Secondary | ICD-10-CM | POA: Diagnosis not present

## 2018-10-25 DIAGNOSIS — S88919A Complete traumatic amputation of unspecified lower leg, level unspecified, initial encounter: Secondary | ICD-10-CM | POA: Diagnosis not present

## 2018-10-28 ENCOUNTER — Other Ambulatory Visit: Payer: Self-pay | Admitting: Endocrinology

## 2018-10-29 MED ORDER — CLONIDINE HCL 0.3 MG PO TABS: 0.3000 mg | ORAL_TABLET | Freq: Two times a day (BID) | ORAL | 3 refills | Status: DC

## 2018-10-30 ENCOUNTER — Other Ambulatory Visit: Payer: Self-pay | Admitting: *Deleted

## 2018-10-30 MED ORDER — HYDRALAZINE HCL 50 MG PO TABS: 50.0000 mg | ORAL_TABLET | Freq: Four times a day (QID) | ORAL | 0 refills | Status: DC

## 2018-10-31 ENCOUNTER — Ambulatory Visit (HOSPITAL_COMMUNITY)
Admission: RE | Admit: 2018-10-31 | Discharge: 2018-10-31 | Disposition: A | Discharge status: Home / Self Care | Payer: Medicare Other | Source: Ambulatory Visit | Attending: Nephrology | Admitting: Nephrology

## 2018-10-31 ENCOUNTER — Other Ambulatory Visit: Payer: Self-pay

## 2018-10-31 VITALS — BP 129/77 | HR 88 | Temp 97.7°F | Resp 18

## 2018-10-31 DIAGNOSIS — D631 Anemia in chronic kidney disease: Secondary | ICD-10-CM | POA: Insufficient documentation

## 2018-10-31 DIAGNOSIS — N183 Chronic kidney disease, stage 3 unspecified: Secondary | ICD-10-CM | POA: Insufficient documentation

## 2018-10-31 LAB — POCT HEMOGLOBIN-HEMACUE: Hemoglobin: 10.5 g/dL — ABNORMAL LOW (ref 13.0–17.0)

## 2018-10-31 LAB — FERRITIN: Ferritin: 437 ng/mL — ABNORMAL HIGH (ref 24–336)

## 2018-10-31 LAB — IRON AND TIBC
Iron: 46 ug/dL (ref 45–182)
Saturation Ratios: 15 % — ABNORMAL LOW (ref 17.9–39.5)
TIBC: 297 ug/dL (ref 250–450)
UIBC: 251 ug/dL

## 2018-10-31 MED ORDER — DARBEPOETIN ALFA 100 MCG/0.5ML IJ SOSY
PREFILLED_SYRINGE | INTRAMUSCULAR | Status: AC
  Filled 2018-10-31: qty 0.5

## 2018-10-31 MED ORDER — DARBEPOETIN ALFA 100 MCG/0.5ML IJ SOSY
100.0000 ug | PREFILLED_SYRINGE | INTRAMUSCULAR | Status: DC
  Administered 2018-10-31: 15:00:00 100 ug via SUBCUTANEOUS

## 2018-11-05 ENCOUNTER — Other Ambulatory Visit: Payer: Self-pay

## 2018-11-05 ENCOUNTER — Ambulatory Visit (INDEPENDENT_AMBULATORY_CARE_PROVIDER_SITE_OTHER): Payer: Medicare Other | Admitting: Endocrinology

## 2018-11-05 ENCOUNTER — Encounter: Payer: Self-pay | Admitting: Endocrinology

## 2018-11-05 DIAGNOSIS — E1165 Type 2 diabetes mellitus with hyperglycemia: Secondary | ICD-10-CM

## 2018-11-05 DIAGNOSIS — Z794 Long term (current) use of insulin: Secondary | ICD-10-CM

## 2018-11-05 NOTE — Progress Notes (Signed)
Patient ID: [name], male   DOB: 11/24/1975, 43 y.o.   MRN: [id number]          Reason for Appointment: Follow-up for Type 2 Diabetes   Today's office visit was provided via telemedicine using video technique Explained to the patient and the the limitations of evaluation and management by telemedicine and the availability of in person appointments.  The patient understood the limitations and agreed to proceed. Patient also understood that the telehealth visit is billable. . Location of the patient: Home . Location of the provider: Office Only the patient and myself were participating in the encounter   History of Present Illness:          Date of diagnosis of type 2 diabetes mellitus:  2001      His weight is about 400 pounds at the time of diagnosis He does not know what his initial blood sugars and circumstances were but he apparently was started on insulin initially He was not able to tolerate metformin because of diarrhea Subsequently has been on insulin only, level of control not available but his A1c was significantly high at 12.8 in 2015  Background history:    Recent history:   INSULIN regimen is:   100 units Tresiba, NovoLog 20-25-30 before meals  Non-insulin hypoglycemic drugs the patient is taking are: Victoza: 1.2 mg daily  His A1c was last 7.8 and last month was 6.9  Current management, blood sugar patterns and problems identified:  After his last visit his Tyler Aas was increased because of persistently high blood sugars in the morning  However his blood sugars have been as high as 325 recently  He thinks that he was having high sugars when he was not able to exercise because of ankle sprain  Most of the time he is checking blood sugars only in the mornings and sometimes late morning or around lunchtime but not after meals as directed  He still has not lost weight  Currently he has taken somewhat more insulin at suppertime for his mealtime coverage but  not adjusting it based on what he is eating  Also no hypoglycemia  He continues to take Victoza 1.2 mg and he thinks that he is not having any side effects with this.  Also not missing any doses  He is scheduled to see the dietitian in preparation for possible gastric bypass surgery  Despite cutting back on the exercise regimen in the last week or 2 he has still readings as high as 204 recently  Checking blood sugars intermittently   Side effects from medications have been: Diarrhea with metformin, vomiting with Ozempic  Compliance with the medical regimen: Improving   Glucose monitoring:  done at least 1 a day         Glucometer:   ACCU-CHEK [employer]  Blood sugar readings from patient meter related by patient with readings since 10/11/2018   PRE-MEAL Fasting Lunch Dinner Bedtime Overall  Glucose range:  161-222  104-278 ?    Mean/median:     ?   POST-MEAL PC Breakfast PC Lunch PC Dinner  Glucose range:  176-325  300   Mean/median:      Previous readings:   PRE-MEAL Fasting Lunch Dinner Bedtime Overall  Glucose range:  82-210  104-285 ?    Mean/median:         POST-MEAL PC Breakfast PC Lunch PC Dinner  Glucose range:    122-238  Mean/median:         Self-care:  Typical meal intake: Breakfast is egg whites, some oatmeal/grits and banana.  Usually small portions of meat or other protein at lunch and dinner and only a small amount of sweet potato at lunch or carbohydrate              Dietician visit, most recent: Unknown               Exercise:  Previously was doing 2 sessions of 1 hour each at the gym with weights, resistance and some cardio,, recently less frequent and less intensive    Weight history:  Wt Readings from Last 3 Encounters:  10/23/18 (!) 370 lb (167.8 kg)  07/24/18 (!) 369 lb 4.8 oz (167.5 kg)  06/06/18 (!) 364 lb (165.1 kg)    Glycemic control:   Lab Results  Component Value Date   HGBA1C 6.9 (A) 09/19/2018   HGBA1C 7.8 (A) 06/18/2018    HGBA1C 5.6 02/20/2018   Lab Results  Component Value Date   MICROALBUR 160.61 (H) 08/14/2013   LDLCALC 91 06/05/2017   CREATININE 2.55 (H) 09/03/2018   Lab Results  Component Value Date   MICRALBCREAT 818.2 (H) 08/14/2013    Lab Results  Component Value Date   FRUCTOSAMINE 263 12/04/2017      Allergies as of 11/05/2018      Reactions   Reglan [metoclopramide] Other (See Comments)   Dysphoric reaction      Medication List       Accurate as of November 05, 2018 11:10 AM. If you have any questions, ask your nurse or doctor.        accu-chek softclix lancets Use to check blood sugars 5 times a day. Dx code:E11.9. Insulin dependent.   Accu-Chek Softclix Lancets lancets USE TO CHECK BLOOD SUGAR 5 TIMES DAILY. DX CODE E11.9.   allopurinol 100 MG tablet Commonly known as: ZYLOPRIM Take 2 tablets (200 mg total) by mouth daily.   Alpha-Lipoic Acid 200 MG Caps Take 1 capsule (200 mg total) by mouth daily.   apixaban 5 MG Tabs tablet Commonly known as: Eliquis Take 1 tablet (5 mg total) by mouth 2 (two) times daily.   aspirin EC 81 MG tablet Take 81 mg by mouth as needed for mild pain (or headaches).   B-12 2500 MCG Subl Place 2,500 mcg under the tongue daily.   calcitRIOL 0.25 MCG capsule Commonly known as: ROCALTROL Take 1 capsule by mouth once daily   carvedilol 25 MG tablet Commonly known as: COREG Take 1 tablet (25 mg total) by mouth 2 (two) times daily.   cloNIDine 0.3 MG tablet Commonly known as: CATAPRES Take 1 tablet (0.3 mg total) by mouth 2 (two) times daily.   famotidine 20 MG tablet Commonly known as: PEPCID Take 1 tablet (20 mg total) by mouth 2 (two) times daily.   FISH OIL PO Take 1 capsule by mouth at bedtime.   furosemide 80 MG tablet Commonly known as: LASIX Take 1 tablet (80 mg total) by mouth 2 (two) times daily.   gabapentin 100 MG capsule Commonly known as: NEURONTIN Take 1 capsule (100 mg total) by mouth 3 (three) times  daily.   glucose blood test strip USE AS INSTRUCTED TO CHECK BLOOD SUGARS THREE TIMES DAILY   hydrALAZINE 50 MG tablet Commonly known as: APRESOLINE Take 1 tablet (50 mg total) by mouth 4 (four) times daily.   insulin aspart 100 UNIT/ML injection Commonly known as: novoLOG Inject 25 Units into the skin 3 (three) times daily before  meals.   insulin aspart 100 UNIT/ML FlexPen Commonly known as: NOVOLOG Inject 25 Units into the skin 3 (three) times daily with meals. 20 unit morning, 25 units lunch 30 units for dinner   Insulin Degludec 200 UNIT/ML Sopn Inject 60 Units into the skin daily. What changed:   how much to take  when to take this   lovastatin 20 MG tablet Commonly known as: MEVACOR Take 1 tablet (20 mg total) by mouth daily.   metolazone 2.5 MG tablet Commonly known as: ZAROXOLYN Take 2.5 mg by mouth every other day.   ondansetron 4 MG disintegrating tablet Commonly known as: Zofran ODT Take 1 tablet (4 mg total) by mouth every 8 (eight) hours as needed for nausea or vomiting.   oxybutynin 5 MG tablet Commonly known as: DITROPAN Take 5 mg by mouth 3 (three) times daily.   oxyCODONE-acetaminophen 5-325 MG tablet Commonly known as: PERCOCET/ROXICET Take 1 tablet by mouth every 6 (six) hours as needed for severe pain.   Pen Needles 3/16" 31G X 5 MM Misc Use five times daily   B-D UF III MINI PEN NEEDLES 31G X 5 MM Misc Generic drug: Insulin Pen Needle USE  4 TIMES DAILY   SUMAtriptan 50 MG tablet Commonly known as: IMITREX Take 1 tablet (50 mg total) by mouth every 2 (two) hours as needed for migraine. May repeat in 2 hours if headache persists or recurs.   tamsulosin 0.4 MG Caps capsule Commonly known as: FLOMAX Take 0.4 mg by mouth at bedtime.   Victoza 18 MG/3ML Sopn Generic drug: liraglutide INJECT 1.2 MG (0.2 MLS) INTO THE SKIN ONCE DAILY AT THE SAME TIME.   zinc sulfate 220 (50 Zn) MG capsule Take 220 mg by mouth daily.       Allergies:   Allergies  Allergen Reactions  . Reglan [Metoclopramide] Other (See Comments)    Dysphoric reaction    Past Medical History:  Diagnosis Date  . Acute venous embolism and thrombosis of deep vessels of proximal lower extremity ([facility]) 07/19/2011  . Anemia   . Chest pain, neg MI, normal coronaries by cath 02/18/2013  . CKD (chronic kidney disease) stage 3, GFR 30-59 ml/min ([facility]) 02/19/2013  . Colon polyps   . Diabetic ulcer of right foot ([facility])   . DVT (deep venous thrombosis) ([facility]) 09/2002   patient reports additional DVTs in '06 & '11 (unconfirmed)  . GERD (gastroesophageal reflux disease)   . History of blood transfusion    "related to OR" (10/31/2016)  . Hyperlipidemia 02/19/2013  . Hypertension   . Nausea & vomiting    "constant for the last couple weeks" (10/31/2016)  . Nephrotic syndrome   . Obesity    BMI 44, weight 346 pounds 01/30/14  . OSA (obstructive sleep apnea) 02/28/2018   Mild obstructive sleep apnea with an AHI of 9.8/h but severe during REM sleep with an AHI of 33.8/h.  Oxygen saturations dropped to 86% and there was moderate snoring  . Prosthesis adjustment 08/17/2016  . Pulmonary embolism ([facility]) 09/2002   treated with 6 months of warfarin  . Pyelonephritis 02/02/2018  . Type I diabetes mellitus ([facility]) dx'd 2001    Past Surgical History:  Procedure Laterality Date  . AMPUTATION Right 12/22/2013   Procedure: AMPUTATION BELOW KNEE;  Surgeon: [name], MD;  Location: [facility];  Service: Orthopedics;  Laterality: Right;  . AMPUTATION Right 02/19/2014   Procedure: RIGHT ABOVE KNEE AMPUTATION ;  Surgeon: [name], MD;  Location: [facility];  Service: Orthopedics;  Laterality: Right;  . CARDIAC CATHETERIZATION  02/18/2013   normal coronaries  . ESOPHAGOGASTRODUODENOSCOPY N/A 11/02/2016   Procedure: ESOPHAGOGASTRODUODENOSCOPY (EGD);  Surgeon: [name], MD;  Location: [facility] ENDOSCOPY;  Service: Endoscopy;  Laterality: N/A;  . I&D EXTREMITY Right 12/14/2013   Procedure:  IRRIGATION AND DEBRIDEMENT RIGHT FOOT;  Surgeon: [name], MD;  Location: [facility];  Service: Orthopedics;  Laterality: Right;  . INCISION AND DRAINAGE ABSCESS  2007; 2015   "back"  . INCISION AND DRAINAGE OF WOUND Right 12/22/2013   Procedure: I&D RIGHT BUTTOCK;  Surgeon: [name], MD;  Location: [facility];  Service: Orthopedics;  Laterality: Right;  . LEFT HEART CATHETERIZATION WITH CORONARY ANGIOGRAM N/A 02/18/2013   Procedure: LEFT HEART CATHETERIZATION WITH CORONARY ANGIOGRAM;  Surgeon: [name], MD;  [facility] C[facility] LAB;  Service: Cardiovascular;  Laterality: N/A;    Family History  Problem Relation Age of Onset  . Heart disease Mother   . Diabetes Mother   . Diabetes Maternal Uncle   . Diabetes Cousin     Social History:  reports that he has never smoked. He has never used smokeless tobacco. He reports current alcohol use. He reports that he does not use drugs.   Review of Systems    Lipid history: Has been on lovastatin with control as follows: Prescribed by PCP   Lab Results  Component Value Date   CHOL 150 06/05/2017   HDL 33.30 (L) 06/05/2017   LDLCALC 91 06/05/2017   TRIG 127.0 06/05/2017   CHOLHDL 4 06/05/2017           Hypertension: He is taking clonidine, Norvasc and Coreg followed by nephrologist  BP Readings from Last 3 Encounters:  10/31/18 129/77  10/03/18 (!) 164/92  09/05/18 (!) 163/85     Most recent eye exam was in 08/2017  Most recent foot exam: 1/91   Complications of diabetes: Nephropathy with renal insufficiency and proteinuria, unknown status of retinopathy, erectile dysfunction  Followed by nephrologist for CKD Also has anemia  Lab Results  Component Value Date   CREATININE 2.55 (H) 09/03/2018   CREATININE 3.51 (H) 05/07/2018   CREATININE 2.88 (H) 04/24/2018     LABS:  Hospital Outpatient Visit on 10/31/2018  Component Date Value Ref Range Status  . Ferritin 10/31/2018 437* 24 - 336 ng/mL Final   Performed at  [facility], [place] [address], [place], [place] [address]  . Iron 10/31/2018 46  45 - 182 ug/dL Final  . TIBC 10/31/2018 297  250 - 450 ug/dL Final  . Saturation Ratios 10/31/2018 15* 17.9 - 39.5 % Final  . UIBC 10/31/2018 251  ug/dL Final   Performed at [facility], [place] [address], [place], [place] [address]  . Hemoglobin 10/31/2018 10.5* 13.0 - 17.0 g/dL Final    Physical Examination:  There were no vitals taken for this visit.      ASSESSMENT:  Diabetes type 2, with morbid obesity  See history of present illness for detailed discussion of current diabetes management, blood sugar patterns and problems identified  His last A1c is more recently 6.9, previously 7.8  Blood sugars have been overall poorly controlled This is likely to be from inconsistent diet, and activity especially recently Even though he is trying to exercise again his fasting readings are persistently high Not clear what his postprandial readings are  Discussed his day-to-day problems with blood sugar control and how to improve Discussed blood sugar monitoring  and blood sugar targets at various times  PLAN:   He will continue Victoza 1.2 mg daily No change in NovoLog unless his blood sugars are higher after dinner Discussed adjusting NovoLog based on meal size and carbohydrate Increase Tresiba up to 110 If he is getting relatively low readings after breakfast he can reduce his NovoLog to 15 in the morning Make sure he takes his NovoLog before starting to eat He will review his diet with the dietitian at the upcoming visit  Will review his management in about [redacted] weeks along with labs  There are no Patient Instructions on file for this visit.    Counseling time on subjects discussed in assessment and plan sections is over 50% of today's 25 minute visit  [name] 11/05/2018, 11:10 AM   Note: This office note was prepared with Dragon voice recognition system technology. Any transcriptional  errors that result from this process are unintentional.

## 2018-11-12 ENCOUNTER — Other Ambulatory Visit: Payer: Self-pay | Admitting: Internal Medicine

## 2018-11-12 ENCOUNTER — Encounter: Payer: Self-pay | Admitting: Skilled Nursing Facility1

## 2018-11-12 ENCOUNTER — Other Ambulatory Visit: Payer: Self-pay | Admitting: Endocrinology

## 2018-11-12 ENCOUNTER — Encounter: Payer: Medicare Other | Attending: General Surgery | Admitting: Skilled Nursing Facility1

## 2018-11-12 ENCOUNTER — Other Ambulatory Visit: Payer: Self-pay

## 2018-11-12 DIAGNOSIS — E669 Obesity, unspecified: Secondary | ICD-10-CM | POA: Diagnosis not present

## 2018-11-12 DIAGNOSIS — E1169 Type 2 diabetes mellitus with other specified complication: Secondary | ICD-10-CM | POA: Diagnosis not present

## 2018-11-12 NOTE — Progress Notes (Signed)
Nutrition Assessment for Bariatric Surgery Medical Nutrition Therapy Appt Start Time: 10:40 End Time: 11:45  Patient was seen on 11/12/2018 for Pre-Operative Nutrition Assessment. Letter of approval faxed to [facility] bariatric surgery program coordinator on 11/12/2018  Referral stated Supervised Weight Loss (SWL) visits needed: 2 (to be done elsewhere)  Planned surgery: RYGB Pt expectation of surgery: to be healthy Pt expectation of dietitian: education     NUTRITION ASSESSMENT   Anthropometrics  Start weight at [facility]: pt did not bring his prosthetic so he could not stand on the scale  lbs (date: 11/12/2018)  Height: 74 in BMI:  kg/m2     Clinical  Medical hx: diabetes Medications: long and short acting insulin, victoza, lasix Labs:  Notable signs/symptoms: arrives ina  Wheel chair due to lack of prosthetic being worn today  Lifestyle & Dietary Hx (living situation, sleep regimen, functional ability, weight hx, current dietary patterns, fluid intake, supplements, physical activity, etc)  Pt states he likes to research and is using appropriate resources.  Pt is highly knowledgeable of the procedure and the lifestyle changes that need to be made for success.  Pt states he wishes to lose weight prior to surgery around 45 pound loss.  Pt states he has had diabetes since 2001: checking his blood sugar 3-5 times a day: fasting: 260 (birthday), 70-160; post prandial: 130-240. Pt states he sometimes skips meals because he is not hungry.  Pt states 5 days a week he has been doing some strength training and cardio.  Pt states he loves to cook.  Pt states he has low blood sugar (under 80) 2-3 times a week due to not eating enough when working out: feeling fatigue, lightheaded.  Pt states his blood sugars drop tremendously after and min the middle of working out. Pt admits not having carb with his breakfast causes the low.  Pt states he has an unsettled stomach most mornings  and has dry mouth from medications.  Pt states he is doing online classes at [employer]. Pt states he hopes to get his law degree.  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   24-Hr Dietary Recall First Meal: 3 egg whites and 1 yolk, mini bagel and [place] bacon Snack:  Second Meal: sandwich on wheat and side salad Snack: popcorn Third Meal: chicken spring mix salad carrots or sandwich Snack:  Beverages: water, natures twist, gingerale   Estimated Energy Needs Calories: 1600 Carbohydrate: 180g Protein: 120g Fat: 44g   NUTRITION DIAGNOSIS  Overweight/obesity (Animas-3.3) related to past poor dietary habits and physical inactivity as evidenced by patient w/ planned RYGB surgery following dietary guidelines for continued weight loss.    NUTRITION INTERVENTION  Nutrition counseling (C-1) and education (E-2) to facilitate bariatric surgery goals.   Pre-Op Goals Reviewed with the Patient . Track food and beverage intake (pen and paper, MyFitness Pal, Baritastic app, etc.) . Make healthy food choices while monitoring portion sizes . Consume 3 meals per day or try to eat every 3-5 hours . Avoid concentrated sugars and fried foods . Keep sugar & fat in the single digits per serving on food labels . Practice CHEWING your food (aim for applesauce consistency) . Practice not drinking 15 minutes before, during, and 30 minutes after each meal and snack . Avoid all carbonated beverages (ex: soda, sparkling beverages)  . Limit caffeinated beverages (ex: coffee, tea, energy drinks) . Avoid all sugar-sweetened beverages (ex: regular soda, sports drinks)  . Avoid alcohol  . Aim for 64-100  ounces of FLUID daily (with at least half of fluid intake being plain water)  . Aim for at least 60-80 grams of PROTEIN daily . Look for a liquid protein source that contains ?15 g protein and ?5 g carbohydrate (ex: shakes, drinks, shots) . Make a list of non-food related activities . Physical activity is an important part  of a healthy lifestyle so keep it moving! The goal is to reach 150 minutes of exercise per week, including cardiovascular and weight baring activity.  *Goals that are bolded indicate the pt would like to start working towards these  Handouts Provided Include  . Bariatric Surgery handouts (Nutrition Visits, Pre-Op Goals, Protein Shakes, Vitamins & Minerals) Meal ideas sheet: use this sheet to put together balanced meals   Learning Style & Readiness for Change Teaching method utilized: Visual & Auditory  Demonstrated degree of understanding via: Teach Back  Barriers to learning/adherence to lifestyle change: none identified       MONITORING & EVALUATION Dietary intake, weekly physical activity, body weight, and pre-op goals reached at next nutrition visit.    Next Steps  Patient is to call [facility] to enroll in Pre-Op Class (>2 weeks before surgery) and Post-Op Class (2 weeks after surgery) for further nutrition education when surgery date is scheduled.

## 2018-11-14 ENCOUNTER — Other Ambulatory Visit: Payer: Self-pay

## 2018-11-14 ENCOUNTER — Encounter: Payer: Self-pay | Admitting: Internal Medicine

## 2018-11-14 ENCOUNTER — Ambulatory Visit (INDEPENDENT_AMBULATORY_CARE_PROVIDER_SITE_OTHER): Payer: Medicare Other | Admitting: Internal Medicine

## 2018-11-14 ENCOUNTER — Ambulatory Visit: Payer: Medicare Other | Admitting: Dietician

## 2018-11-14 ENCOUNTER — Encounter: Payer: Self-pay | Admitting: Dietician

## 2018-11-14 ENCOUNTER — Ambulatory Visit (INDEPENDENT_AMBULATORY_CARE_PROVIDER_SITE_OTHER): Payer: Medicare Other | Admitting: Dietician

## 2018-11-14 VITALS — BP 120/72 | HR 84 | Temp 98.1°F | Ht 76.0 in | Wt 370.0 lb

## 2018-11-14 DIAGNOSIS — Z794 Long term (current) use of insulin: Secondary | ICD-10-CM | POA: Diagnosis not present

## 2018-11-14 DIAGNOSIS — Z713 Dietary counseling and surveillance: Secondary | ICD-10-CM

## 2018-11-14 DIAGNOSIS — Z6841 Body Mass Index (BMI) 40.0 and over, adult: Secondary | ICD-10-CM

## 2018-11-14 DIAGNOSIS — I10 Essential (primary) hypertension: Secondary | ICD-10-CM | POA: Diagnosis not present

## 2018-11-14 DIAGNOSIS — E785 Hyperlipidemia, unspecified: Secondary | ICD-10-CM

## 2018-11-14 DIAGNOSIS — E119 Type 2 diabetes mellitus without complications: Secondary | ICD-10-CM

## 2018-11-14 LAB — GLUCOSE, CAPILLARY: Glucose-Capillary: 116 mg/dL — ABNORMAL HIGH (ref 70–99)

## 2018-11-14 NOTE — Patient Instructions (Addendum)
[name],  It was a pleasure meeting and working with you today. Today we went over your general state of health. We spoke about your blood pressure control, and your improving blood sugars. We will check your cholesterol today, and you foot exam. I look forward to working with you in 3 months time. If you have any concerns between now and your appointment, please reach out to our clinic with questions. Best of luck in your studies, and happy belated birthday.  Sincerely,  [name], MD

## 2018-11-14 NOTE — Progress Notes (Signed)
CC: Routine Care/Health Maintenance   HPI:  [name] is a 43 y.o. male, with a PMH of anemia, DVT, GERD, HLD, HTN, and OSA, who presents to the clinic for routine health care maintenance. To see the management of his acute and chronic medical conditions, please see the assessment and plan under the encounters tab.   Past Medical History:  Diagnosis Date  . Acute venous embolism and thrombosis of deep vessels of proximal lower extremity ([place]) 07/19/2011  . Anemia   . Chest pain, neg MI, normal coronaries by cath 02/18/2013  . CKD (chronic kidney disease) stage 3, GFR 30-59 ml/min 02/19/2013  . Colon polyps   . Diabetic ulcer of right foot ([place])   . DVT (deep venous thrombosis) ([place]) 09/2002   patient reports additional DVTs in '06 & '11 (unconfirmed)  . GERD (gastroesophageal reflux disease)   . History of blood transfusion    "related to OR" (10/31/2016)  . Hyperlipidemia 02/19/2013  . Hypertension   . Nausea & vomiting    "constant for the last couple weeks" (10/31/2016)  . Nephrotic syndrome   . Obesity    BMI 44, weight 346 pounds 01/30/14  . OSA (obstructive sleep apnea) 02/28/2018   Mild obstructive sleep apnea with an AHI of 9.8/h but severe during REM sleep with an AHI of 33.8/h.  Oxygen saturations dropped to 86% and there was moderate snoring  . Prosthesis adjustment 08/17/2016  . Pulmonary embolism ([place]) 09/2002   treated with 6 months of warfarin  . Pyelonephritis 02/02/2018  . Type I diabetes mellitus ([place]) dx'd 2001   Review of Systems:  Review of Systems  Constitutional: Negative for chills, fever and weight loss.  HENT: Negative for hearing loss.   Eyes: Negative for blurred vision, double vision, photophobia and pain.  Respiratory: Negative for cough.   Cardiovascular: Negative for chest pain, palpitations and orthopnea.  Gastrointestinal: Negative for abdominal pain, constipation, diarrhea, nausea and vomiting.  Musculoskeletal: Negative for myalgias.   Neurological: Negative for dizziness, tingling and headaches.     Physical Exam:  Vitals:   11/14/18 1436  BP: 120/72  Pulse: 84  Temp: 98.1 F (36.7 C)  TempSrc: Oral  SpO2: 100%  Weight: (!) 370 lb (167.8 kg)  Height: 6\' 4"  (1.93 m)   Physical Exam Constitutional:      General: He is not in acute distress.    Appearance: Normal appearance. He is obese. He is not ill-appearing, toxic-appearing or diaphoretic.     Comments: Patient sitting comfortably in his wheelchair, appears in no acute distress.   HENT:     Head: Normocephalic and atraumatic.  Cardiovascular:     Rate and Rhythm: Normal rate and regular rhythm.     Pulses: Normal pulses.     Heart sounds: Normal heart sounds. No murmur. No friction rub. No gallop.   Pulmonary:     Effort: Pulmonary effort is normal. No respiratory distress.     Breath sounds: Normal breath sounds. No wheezing, rhonchi or rales.  Abdominal:     General: Bowel sounds are normal.     Palpations: Abdomen is soft.     Tenderness: There is no abdominal tenderness. There is no guarding.  Musculoskeletal:        General: Swelling present.     Left lower leg: No edema.     Comments: Swelling of the left lower extremity.   Skin:    General: Skin is warm.  Neurological:     General:  No focal deficit present.     Mental Status: He is alert and oriented to person, place, and time.  Psychiatric:        Mood and Affect: Mood normal.        Behavior: Behavior normal.     Assessment & Plan:   See Encounters Tab for problem based charting.  Patient seen with Dr. Dareen Piano

## 2018-11-14 NOTE — Progress Notes (Signed)
Medical Nutrition Therapy :  Appt start time: 1335 end time:  1430 Total time:55 minutes Visit # 6  Assessment:  Primary concerns today:Patient wanted help with a meal plan. Mentioned wanted something he could follow and seems he didn't want something too flexible. Discussed and created a concrete meal plan to follow but with some flexibility. Patient was involved with meal plan creation (3 meals and 2 snacks). Also suggested some ideas to record calorie context in smoothies and meals. Patient mentioned he will start a food diary and meal plan on Monday of next week. Was told to write down things that did or didn't work with the meal plan and can make adjustments at next appointment.  Also mentioned endocrinologist increased his insulin dosage and suggested increase of carbs dosage to help with hypoglycemic episodes he experience after working out or at night. Wants to get into the mindset of "diet" to help prep with bariatric surgery post-op diet.   ANTHROPOMETRICS:  Wt Readings from Last 5 Encounters:  11/14/18 (!) 370 lb (167.8 kg)  11/14/18 (!) 370 lb 12.8 oz (168.2 kg)  10/23/18 (!) 370 lb (167.8 kg)  07/24/18 (!) 369 lb 4.8 oz (167.5 kg)  06/06/18 (!) 364 lb (165.1 kg)      BLOOD SUGAR: Forget to bring meter  DIETARY INTAKE:   Meal 1- 470 calories 1 carb (80-100 calories)- mini--bagel  1 protein- (150 calories) 3 egg whites 2 fats- (100 calories) [place] bacon 2 fruits-(120 calories) 8 oz orange juice  Meal 2- 660 calories 2 carbs - (200 calories)croutons and whole grain bread toasted/beans/ hummus OR two slices bread 1 protein- (250 calories) chicken/ham or [place] 1-2 vegetable-(50 calories)  carrots/salad/spring mic 1 fruit-(60 calories) dried cranberries and half an apple 2 fats- (100 calories) salad dressing  Snack 2 starch/fruit or dairy- (100-200 calories)  Meal 3- 660 calories 2 carbs - (200 calories) croutons and whole grain bread toasted/beans/ hummus OR two slices  bread 1 protein- (250 calories)chicken/ham or [place] 2 vegetable-(50 calories) carrots/salad/spring mic 1 fruit- (60 calories)dried cranberries and half an apple 2 fats- (100 calories) salad dressing  Snack 2 starch/fruit or dairy- (100-200 calories)   PHYSICAL ACTIVITY: Has not adjusted insulin for exercise. Work outs at home and in [employer]. Free weight and bands. Reports having problems with low blood sugars during exercise, feels like he needs to do more. Doctor mentioned increase in carbs to help with this  Progress Towards Goal(s):  Some progress.   Nutritional Diagnosis:  NB-1.1 Food and nutrition-related knowledge deficit As related to lack of sufficient prior exposure to diabetes meal planning is improving  As evidenced by his blood sugar values and report.    Intervention:  nutrition and diabetes support for evaluating his own progress and what behavior(s) are/is causing the change.   Action Goal: Begin a food diary and start created meal plan Outcome goal:Lose weight and get adjusted to meal plan lifestyle he will experience post-op Coordination of care: none  Teaching Method Utilized:  Auditory and visual Handouts given during visit include: meal panner and food record Barriers to learning/adherence to lifestyle change: competing values/demands, schooling, transportation, mobility, lack interest in of self-monitoring Demonstrated degree of understanding via:  Teach Back   Monitoring/Evaluation:  Dietary intake, exercise, meter,prosthesis and body weight in 1 week(s). Debera Lat, RD 11/14/2018 2:40 PM.

## 2018-11-14 NOTE — Assessment & Plan Note (Addendum)
Assessment:  During today's appointment, it was found that [name] blood pressure was 120/72. Patient states that he is adherent to his carvedilol, hydralazine, clonidine, furosemide, and metolozone. Blood pressure is well controlled at the time of this visit.  Plan:  - Will continue current medical regime.

## 2018-11-15 LAB — LIPID PANEL
Chol/HDL Ratio: 3.6 ratio (ref 0.0–5.0)
Cholesterol, Total: 122 mg/dL (ref 100–199)
HDL: 34 mg/dL — ABNORMAL LOW (ref >39)
LDL Chol Calc (NIH): 64 mg/dL (ref 0–99)
Triglycerides: 135 mg/dL (ref 0–149)
VLDL Cholesterol Cal: 24 mg/dL (ref 5–40)

## 2018-11-15 NOTE — Assessment & Plan Note (Signed)
Assessment:  Patient with history of HLD, with his last lipid panel check being greater than one year. Will order lipid panel and make adjustments to the patient's medication accordingly. Patient is currently on lovastatin 20mg . Plan:  - Order lipid panel  - Continue lovastatin 20mg 

## 2018-11-15 NOTE — Progress Notes (Signed)
Internal Medicine Clinic Attending  Case discussed with Dr. Winters at the time of the visit.  We reviewed the resident's history and exam and pertinent patient test results.  I agree with the assessment, diagnosis, and plan of care documented in the resident's note.  

## 2018-11-15 NOTE — Assessment & Plan Note (Signed)
Discussed with patient routine diabetes management. His A1C from 09/19/2018 shows a reading of 6.9, with a capillary glucose reading of 116 at today's visit. Patient states that he is adherent to his current regime of novolog, degludec, and victoza.  - Continue current medication regime.  - Diabetic foot exam ordered.

## 2018-11-18 ENCOUNTER — Telehealth: Payer: Self-pay | Admitting: *Deleted

## 2018-11-18 NOTE — Telephone Encounter (Signed)
-----   Message from Sueanne Margarita, MD sent at 11/18/2018  4:41 PM EST ----- Good AHI on PAP but needs to improve compliance

## 2018-11-18 NOTE — Telephone Encounter (Signed)
Informed patient of compliance results and verbalized understanding was indicated. Patient is aware and agreeable to AHI being within range at 3.5. Patient is aware and agreeable to improving in compliance with machine usage Patient is aware and agreeable to no change in current pressures

## 2018-11-19 NOTE — Addendum Note (Signed)
Addended by: [name] C on: 11/19/2018 07:21 AM   Modules accepted: Level of Service

## 2018-11-20 ENCOUNTER — Telehealth: Payer: Self-pay | Admitting: Dietician

## 2018-11-20 NOTE — Telephone Encounter (Signed)
appointment tomorrow changed to telehealth per patient request

## 2018-11-21 ENCOUNTER — Other Ambulatory Visit: Payer: Self-pay

## 2018-11-21 ENCOUNTER — Ambulatory Visit (INDEPENDENT_AMBULATORY_CARE_PROVIDER_SITE_OTHER): Payer: Medicare Other | Admitting: Dietician

## 2018-11-21 DIAGNOSIS — E119 Type 2 diabetes mellitus without complications: Secondary | ICD-10-CM | POA: Diagnosis not present

## 2018-11-21 DIAGNOSIS — Z713 Dietary counseling and surveillance: Secondary | ICD-10-CM | POA: Diagnosis not present

## 2018-11-21 DIAGNOSIS — Z794 Long term (current) use of insulin: Secondary | ICD-10-CM | POA: Diagnosis not present

## 2018-11-21 NOTE — Patient Instructions (Signed)
Milen,   Thank you for the phone visit today.   We are wondering what you mean by "clean eating".  Hoping you can share this at next week's visit?   It would be great if you can bring food records, your meter and your prosthesis.   Take care,   [name] [phone]

## 2018-11-21 NOTE — Progress Notes (Signed)
Medical Nutrition Therapy :  Appt start time: 1036 end time:  1100 Total time:24 minutes Visit # 7  Assessment:  Follow up visit to discuss progress with food diary and following meal plan. Patient reports possible surgery in January or February. Patients primary goal is building discipline for after surgery. Patient reports meal planning is working. Helps him stay prepared, save time, stay focused, eliminate distractions, and discipline. No negative drawbacks towards meal prepping. Pt reports hunger is stable, nothing out of the ordinary, don't feel any different than usual. Patient states only thing that can get in his way is himself.     BLOOD SUGAR: Patient mentions sugars up and down (highest 211, lowest 89 (after working out)). Reports he corrected his low blood sugar with some orange juice. Was instructed to monitor high and low blood sugars.  DIETARY INTAKE:   Friday Pizza and wings   Saturday Breakfast: Chick Fila biscuit  Lunch: [place] Sandwich and Cheetos Dinner: Soup  Sunday No breakfast  Lunch/DinnerGrilled chicken salad, carrots, spring mix, raspberry vine  Monday Breakfast:Half of a whole bagel, 3 egg whites and yolk with slice of cheese Lunch:Grilled chicken salad, carrots, spring mix, raspberry vine, wonton strips, dried cranberries  Dinner:Grilled chicken salad, carrots, spring mix, raspberry vine, wonton strips, dried cranberries  Tuesday Breakfast:3 egg whites and 1 whole egg with slice of cheese, grits, and 2 slices of [place] bacon  Dinner:Ground [place] patty with gravy, potatoes, peas   Wednesday Breakfast:Cinnamon Toast Cereal  Lunch:Grill cheese sandwich with wheat bread  Dinner:Grilled cheese, bowl of soup   Snacks Apple 1x this week   PHYSICAL ACTIVITY: Everyday morning (boxing, cardio (modified stuff, sit ups, ab work), Editor, commissioning) Progress Towards Goal(s):  Some progress.    Intervention:  nutrition and diabetes support for evaluating his own  progress and what behavior(s) are/is causing the change.  Action Goal: Continue with current meal plan and food diary   Outcome goal:Lose weight and get adjusted to healthy lifestyle he will experience post-op Coordination of care: none  Teaching Method Utilized: Auditory  Handouts given during visit include:None Barriers to learning/adherence to lifestyle change: competing values/demands, schooling, transportation, mobility, lack interest in of self-monitoring Demonstrated degree of understanding via:  Teach Back  Monitoring/Evaluation:  Dietary intake, exercise, meter,prosthesis and body weight in 1 week(s).  [name], Student-Dietician 11/21/2018 10:35 AM.

## 2018-11-21 NOTE — Progress Notes (Signed)
Medical Nutrition Therapy :  Telephone visit due to COVID-19.   This is a telephone encounter between [name] and [name] and Arnold Long on 11/21/2018 for Medical Nutrition Therapy. The visit was conducted with the patient located at home and [employer]  and Arnold Long at [facility]. The patient's identity was confirmed using their DOB and current address. The patient has consented to being evaluated through a telephone encounter and understands the associated risks / benefits (allows the patient to remain at home, decreasing exposure to coronavirus). I personally spent 24 minutes on medical nutrition therapy discussion.   The following statements were read to the patient and/or legal guardian that are established with the [facility] Provider.   "The purpose of this phone visit is to provide behavioral health care while limiting exposure to the coronavirus (COVID19).  There is a possibility of technology failure and discussed alternative modes of communication if that failure occurs."   "By engaging in this telephone visit, you consent to the provision of healthcare.  Additionally, you authorize for your insurance to be billed for the services provided during this telephone visit."    Patient and/or legal guardian consented to telephone visit: yes  Queried patient aboutr need for medicine adjustment and he did not feel this is needed at this time.  I reviewed and approve this note and the plan by Arnold Long.  Debera Lat, RD 11/21/2018 11:31 AM.

## 2018-11-22 DIAGNOSIS — R3989 Other symptoms and signs involving the genitourinary system: Secondary | ICD-10-CM | POA: Diagnosis not present

## 2018-11-22 DIAGNOSIS — N3944 Nocturnal enuresis: Secondary | ICD-10-CM | POA: Diagnosis not present

## 2018-11-28 ENCOUNTER — Other Ambulatory Visit: Payer: Self-pay

## 2018-11-28 ENCOUNTER — Encounter: Payer: Self-pay | Admitting: Dietician

## 2018-11-28 ENCOUNTER — Ambulatory Visit (HOSPITAL_COMMUNITY)
Admission: RE | Admit: 2018-11-28 | Discharge: 2018-11-28 | Disposition: A | Discharge status: Home / Self Care | Payer: Medicare Other | Source: Ambulatory Visit | Attending: Nephrology | Admitting: Nephrology

## 2018-11-28 ENCOUNTER — Ambulatory Visit (INDEPENDENT_AMBULATORY_CARE_PROVIDER_SITE_OTHER): Payer: Medicare Other | Admitting: Dietician

## 2018-11-28 VITALS — BP 165/94 | HR 93 | Temp 97.3°F | Resp 18

## 2018-11-28 DIAGNOSIS — N183 Chronic kidney disease, stage 3 unspecified: Secondary | ICD-10-CM | POA: Diagnosis not present

## 2018-11-28 DIAGNOSIS — D631 Anemia in chronic kidney disease: Secondary | ICD-10-CM | POA: Insufficient documentation

## 2018-11-28 DIAGNOSIS — Z794 Long term (current) use of insulin: Secondary | ICD-10-CM

## 2018-11-28 DIAGNOSIS — E119 Type 2 diabetes mellitus without complications: Secondary | ICD-10-CM

## 2018-11-28 DIAGNOSIS — Z713 Dietary counseling and surveillance: Secondary | ICD-10-CM

## 2018-11-28 LAB — IRON AND TIBC
Iron: 48 ug/dL (ref 45–182)
Saturation Ratios: 18 % (ref 17.9–39.5)
TIBC: 267 ug/dL (ref 250–450)
UIBC: 219 ug/dL

## 2018-11-28 LAB — POCT HEMOGLOBIN-HEMACUE: Hemoglobin: 10.2 g/dL — ABNORMAL LOW (ref 13.0–17.0)

## 2018-11-28 LAB — FERRITIN: Ferritin: 369 ng/mL — ABNORMAL HIGH (ref 24–336)

## 2018-11-28 MED ORDER — DARBEPOETIN ALFA 100 MCG/0.5ML IJ SOSY
PREFILLED_SYRINGE | INTRAMUSCULAR | Status: AC
  Filled 2018-11-28: qty 0.5

## 2018-11-28 MED ORDER — DARBEPOETIN ALFA 100 MCG/0.5ML IJ SOSY
100.0000 ug | PREFILLED_SYRINGE | INTRAMUSCULAR | Status: DC
  Administered 2018-11-28: 15:00:00 100 ug via SUBCUTANEOUS

## 2018-11-28 NOTE — Progress Notes (Signed)
  Mr. Callender was unable to be weighed today. He feels he is making some progress, but anxious about possible bariatric surgery.  Sent message to bariatric dietitian per his request about a pre-op weight loss goal. I reviewed and approve this note and the plan. Debera Lat, RD 11/28/2018 5:08 PM.

## 2018-11-28 NOTE — Progress Notes (Signed)
Medical Nutrition Therapy :  Appt start time: 3267 end time:  1405 Total time:50 minutes Visit # 8  Assessment:  Patients primary goal was to have a more "direct" meal plan. Reports the current meal plan is helpful but needs help adding different foods to have more variety in his meals. Patient reports is drinking a lot of protein shakes for the convenience (in school) and eating small meals/snacks. He reports he consumsed 3 proteins shakes, half a grilled chesses, and ~7 apples yesterday, said hunger wasn't too bad. Patient reports he is doing this to build discipline for after surgery. He report meal planning and logging food into food diary is what he sees as discipline and plans to reintegrate into his routine this weekend. No negative drawbacks towards meal prepping.  Additionally, patient reports getting rid of temptations in his household that would trigger his bad habits. Helps with staying focused, eliminates distractions, and builds discipline.     BLOOD SUGAR: Patient didn't bring meter, will discuss blood sugars next visit.  DIETARY INTAKE:  In assessment PHYSICAL ACTIVITY: In the mornings ~0530am (boxing, cardio (modified stuff, sit ups, ab work), Editor, commissioning) Progress Towards Goal(s):  Some progress.    Intervention:  nutrition and diabetes support for evaluating his own progress and what behavior(s) are/is causing the change. Use meal planning and carbohydrate counting booklet. Action Goal: Continue with current meal plan, in addition use booklet to add variety in meal plan. Start food diary again.    Outcome goal:Lose weight and get adjusted to healthy lifestyle he will experience post-op Coordination of care: consider referral to behavior health   Teaching Method Utilized: Auditory and visual Handouts given during visit include:eal planning and carbohydrate counting booklet Barriers to learning/adherence to lifestyle change: competing values/demands, schooling, transportation,  mobility, lack interest in of self-monitoring Demonstrated degree of understanding via:  Teach Back  Monitoring/Evaluation:  Dietary intake, exercise, meter,prosthesis and body weight in 1 week(s).  [name], Student-Dietician 11/28/2018 2:15 PM.

## 2018-11-28 NOTE — Patient Instructions (Addendum)
I will call you with information about a weight loss goal.   Focus- Discipline- Structure  To you is keeping a food diary  Meal 1- 500 calories, 15-20 grams 3 carbs (80-100 calories)- mini--bagel 2 fruits-(120 calories) 8 oz orange juice 1 protein- (150 calories) 3 egg whites 2 fats- (100 calories) [place] bacon  Meal 2- 660 calories 3 carbs - (200 calories) 2 starches (croutons and whole grain bread toasted/beans/ hummus) OR two slices bread &   1 WPYKD-(98 calories) dried cranberries and half an apple 1 protein- (250 calories) chicken/ham or [place] 1-2 vegetable-(50 calories)  carrots/salad/spring mic 2 fats- (100 calories) salad dressing  Snack 2 starch/fruit or dairy- (100-200 calories)  Meal 3- 660 calories 3 carbs - (200 calories) 2 starches (croutons and whole grain bread toasted/beans/ hummus) OR two slices bread &   1 PJASN-(05 calories) dried cranberries and half an apple 1 protein- (250 calories) chicken/ham or [place] 1-2 vegetable-(50 calories)  carrots/salad/spring mic 2 fats- (100 calories) salad dressing  Snack 2 starch/fruit or dairy- (100-200 calories)

## 2018-12-03 ENCOUNTER — Telehealth: Payer: Self-pay | Admitting: Dietician

## 2018-12-03 NOTE — Telephone Encounter (Signed)
Updated patient that there is no set amount of weight or weight goal requirement for bariatric surgery. Instead, they encourage patients to work on learning new habits. Mr. Willert verbalized understanding.

## 2018-12-04 ENCOUNTER — Ambulatory Visit (INDEPENDENT_AMBULATORY_CARE_PROVIDER_SITE_OTHER): Payer: Medicare Other | Admitting: Dietician

## 2018-12-04 ENCOUNTER — Encounter: Payer: Self-pay | Admitting: Dietician

## 2018-12-04 ENCOUNTER — Other Ambulatory Visit: Payer: Self-pay

## 2018-12-04 DIAGNOSIS — Z794 Long term (current) use of insulin: Secondary | ICD-10-CM | POA: Diagnosis not present

## 2018-12-04 DIAGNOSIS — Z713 Dietary counseling and surveillance: Secondary | ICD-10-CM

## 2018-12-04 DIAGNOSIS — E119 Type 2 diabetes mellitus without complications: Secondary | ICD-10-CM

## 2018-12-04 NOTE — Patient Instructions (Signed)
Focus on adding more carbs to balance out meals  Weight loss goals-  Bring prtosthesis, meter, food diary.

## 2018-12-04 NOTE — Progress Notes (Signed)
Medical Nutrition Therapy :  Appt start time: 0915 end time:  0952 Total time: 37 minutes Visit # 9 Telephone visit due to COVID-19.  This is a telephone encounter between [name]  and [name]  on 12/04/2018 for *Medical Nutrition Therapy . The visit was conducted with the patient located at home and [name]  at [facility]. The patient's identity was confirmed using their DOB and current address. The patient has consented to being evaluated through a telephone encounter and understands the associated risks / benefits (allows the patient to remain at home, decreasing exposure to coronavirus). I personally spent 37 minutes on medical nutrition therapy discussion.   The following statements were read to the patient and/or legal guardian that are established with the Medical Nutrition therapy Provider.   "The purpose of this phone visit is to provide nutritional health care while limiting exposure to the coronavirus (COVID19).  There is a possibility of technology failure and discussed alternative modes of communication if that failure occurs."   "By engaging in this telephone visit, you consent to the provision of healthcare.  Additionally, you authorize for your insurance to be billed for the services provided during this telephone visit."    Patient and/or legal guardian consented to telephone visit: yes   Assessment:   Patients primary goal was to have a meal plan, discipline to follow that meal plan and a weight loss goal to help him feel prepared for surgery.  His agreed upon weight loss goals are as follows: If surgery is in January, then goal is 8-20#, if February then  14-30 #.  Patient reports he wants to work on goals to build discipline for after surgery.  He got about halfway through book about meal planning.  He reports logging food into food diary at 90% for the past week. The barrier to doing it more is not having his notebook with him at times. We discussed using his phone to  record his meals with pictures of the food he eats while out.  He reports keeping the food diary is helping him to be more aware of what he eats and to not eating the "bad stuff.".  We explored his meaning of "bad" foods and discussed alternative meals of incorporating those foods like healthier options for pizza.    He has not started meal prepping yet. He is waiting for his containers to come that he ordered. He agrees that this is a goal once he gets the containers.     BLOOD SUGAR: He reports checking 3x/day, that they are "up and down", checks after eating when he forgets, range this past week have been is  82- 260mg /dl. He reports no problems with low blood sugars now that he eats after exercise.  DIETARY INTAKE: not done in detail today PHYSICAL ACTIVITY: 5 days/week this past week , 60 minutes each time, other than that he does school work- studying, only taking 2 courses next semester  Progress Towards Goal(s):  Some progress.     Intervention:  nutrition and diabetes support for evaluating his own progress and what behavior(s) are/is causing the change. Use meal planning and carbohydrate counting booklet.  Action Goal: Continue with current meal plan, be sure to eat enough carbs at meals, bring meter, food diary and prosthesis next week. Consider CGM to monitor blood sugars more closely especially during workout, monitor for need to adjust diabetes medicine to allow/encouage weight loss.      Outcome goal:Lose weight and get adjusted  to healthy lifestyle he will experience post-op Coordination of care: consider referral to behavior health   Teaching Method Utilized: Auditory Handouts given during visit include:none per patient request Barriers to learning/adherence to lifestyle change: competing values/demands, schooling, transportation, mobility, lack interest in of self-monitoring Demonstrated degree of understanding via:  Teach Back  Monitoring/Evaluation:  Dietary intake, exercise,  meter,prosthesis and body weight in 1 week(s).  Victor Penny Lorali Khamis, RD 12/04/2018 10:09 AM.

## 2018-12-11 ENCOUNTER — Other Ambulatory Visit: Payer: Self-pay

## 2018-12-11 ENCOUNTER — Ambulatory Visit (INDEPENDENT_AMBULATORY_CARE_PROVIDER_SITE_OTHER): Payer: Medicare Other | Admitting: Dietician

## 2018-12-11 ENCOUNTER — Encounter: Payer: Self-pay | Admitting: Dietician

## 2018-12-11 DIAGNOSIS — E119 Type 2 diabetes mellitus without complications: Secondary | ICD-10-CM | POA: Diagnosis not present

## 2018-12-11 DIAGNOSIS — Z794 Long term (current) use of insulin: Secondary | ICD-10-CM | POA: Diagnosis not present

## 2018-12-11 DIAGNOSIS — Z713 Dietary counseling and surveillance: Secondary | ICD-10-CM | POA: Diagnosis not present

## 2018-12-11 NOTE — Progress Notes (Signed)
Medical Nutrition Therapy :  Appt start time: 1320 end time:  1356 Total time: 37 minutes Visit # 10 Telephone visit due to COVID-19.  This is a telephone encounter between [name]  and [name]  on 12/11/2018 for *Medical Nutrition Therapy . The visit was conducted with the patient located at home and [name]  at [facility]. The patient's identity was confirmed using their DOB and current address. The patient has consented to being evaluated through a telephone encounter and understands the associated risks / benefits (allows the patient to remain at home, decreasing exposure to coronavirus). I personally spent 37 minutes on medical nutrition therapy discussion.   The following statements were read to the patient and/or legal guardian that are established with the Medical Nutrition therapy Provider.   "The purpose of this phone visit is to provide nutritional health care while limiting exposure to the coronavirus (COVID19).  There is a possibility of technology failure and discussed alternative modes of communication if that failure occurs."   "By engaging in this telephone visit, you consent to the provision of healthcare.  Additionally, you authorize for your insurance to be billed for the services provided during this telephone visit."    Patient and/or legal guardian consented to telephone visit: yes   Assessment:  Patient wanted to review the amount of carbohydrates he should have for each meal according to each meal plan. Additionally he mentions the incorporation of fiber in his diet as he has been having trouble with his bowels. No update on bariatric surgery but has appointment with office. Mentions holidays went well in regards to food intake. Portions control, reading labels, matching numbers is what he is doing now that he wants to continue to do post-surgery. Says he skips meals due to school work or sleeping in late on the weekend and wants to make it a habit not to  skip meal, now and post-surgery. Meal planning has went well these past two weeks because he is planning well ahead of time. Feels he is working on his discipline and wants to continue with good habits to carry on post-surgery.    BLOOD SUGAR: He reports checking his blood sugars, in the range of 79-260mg /dL.  11/22: 211, 89, 151 11/23:97, 253,161 12/1: 104, 192, 170 (AM, Midday, PM) Reports trying to do better checking blood sugars.   DIETARY INTAKE: not done in detail today PHYSICAL ACTIVITY: Not discussed this visit.  Progress Towards Goal(s):  Some progress.     Intervention:  nutrition and diabetes support for evaluating his own progress and what behavior(s) are/is causing the change. Use meal planning and carbohydrate counting booklet.  Action Goal: Stop skipping meals, replace with a snack if a meal cant be made. Maintain his current blood sugar numbers. Continue with current meal plan with correct carbohydrate amount.    Outcome goal:Lose weight and get adjusted to healthy lifestyle he will experience post-op Coordination of care: none this visit Teaching Method Utilized: Auditory Handouts given during visit include:none per patient request Barriers to learning/adherence to lifestyle change: competing values/demands, schooling, transportation, mobility, lack interest in of self-monitoring Demonstrated degree of understanding via:  Teach Back  Monitoring/Evaluation:  Dietary intake, exercise, meter,prosthesis and body weight in 1 week(s) [name], Student-Dietician 12/11/2018 1:19 PM.

## 2018-12-11 NOTE — Patient Instructions (Addendum)
As discussed today;  Meal 1- 500 calories, 45-50 grams carb  Meal 2- 660 calories, 45-55 grams carb  Snack ~ 100-200 calories, 25-35 grams carb  Meal 3-660 calories, 45-55 grams carb  Snack   100-200 calories, 25-35 grams carb

## 2018-12-11 NOTE — Progress Notes (Signed)
I was present for this visit, reviewed and approve of Devonte Marcello Moores'  note and the plan as outlined. Debera Lat, RD 12/11/2018 3:54 PM.

## 2018-12-12 ENCOUNTER — Ambulatory Visit (INDEPENDENT_AMBULATORY_CARE_PROVIDER_SITE_OTHER): Payer: Medicare Other | Admitting: Internal Medicine

## 2018-12-12 ENCOUNTER — Other Ambulatory Visit: Payer: Self-pay

## 2018-12-12 VITALS — BP 133/64 | HR 98 | Temp 98.5°F | Ht 76.0 in

## 2018-12-12 DIAGNOSIS — W228XXA Striking against or struck by other objects, initial encounter: Secondary | ICD-10-CM | POA: Diagnosis not present

## 2018-12-12 DIAGNOSIS — Z7901 Long term (current) use of anticoagulants: Secondary | ICD-10-CM

## 2018-12-12 DIAGNOSIS — R823 Hemoglobinuria: Secondary | ICD-10-CM | POA: Diagnosis not present

## 2018-12-12 DIAGNOSIS — S8012XA Contusion of left lower leg, initial encounter: Secondary | ICD-10-CM

## 2018-12-12 LAB — POCT URINALYSIS DIPSTICK
Bilirubin, UA: NEGATIVE
Glucose, UA: NEGATIVE
Ketones, UA: NEGATIVE
Nitrite, UA: NEGATIVE
Protein, UA: POSITIVE — AB
Spec Grav, UA: 1.015 (ref 1.010–1.025)
Urobilinogen, UA: 0.2 E.U./dL
pH, UA: 5.5 (ref 5.0–8.0)

## 2018-12-12 NOTE — Assessment & Plan Note (Signed)
Hematoma: Patient presents today with concerns for a hard and somewhat tender knot on the left leg located medially and distally to the left knee.  It is not markedly well demonstrated by a picture.  The area is 4 x 5 cm, subcutaneous, mildly tender and firm.  Bedside ultrasound does not reveal a fluid collection or abscess.  However, there is notable edema of the area and surrounding area as the patient's left lower extremity has chronic lower extremity edema.  No focal lesions identified. Given the patient's story of striking his left leg in this area with subsequent bruising is likely a hematoma and will require more time to resolve.  No evidence of abscess at this time.  Plan: Monitor Apply heat or cold as tolerated Tylenol as needed Patient given strict return precautions

## 2018-12-12 NOTE — Assessment & Plan Note (Signed)
Hemoglobinuria: Patient with chronic hemoglobinuria.  Likely secondary to long-term anticoagulation.  We will further evaluate with repeat urinalysis today Plan:  Wound care dipstick today which was positive Patient follows with urology, we will encourage him to notify them of hemoglobinuria and follow-up with his urologist

## 2018-12-12 NOTE — Patient Instructions (Signed)
FOLLOW-UP INSTRUCTIONS When: As needed What to bring: All of your medications  I have not made any changes to your medications today.   Today we discussed the knot on your left leg would which appears to be the result of a hematoma.  There is no evidence of abscess at this time.  Please keep a close eye on this notify us if you have any swelling localized to the area, redness or severe pain that worsens of the area.  This may take some time to resolve.  For the blood in your urine we will obtain a urinalysis.  If this shows something concerning we will send for official urinalysis in the lab.  You have a history of hemoglobinuria likely secondary to chronic anticoagulation use versus repeat urinary tract infection. Given the lack of symptoms of the later we will monitor this.   Thank you for your visit to the [facility] today. If you have any questions or concerns please call us at [phone].

## 2018-12-12 NOTE — Progress Notes (Signed)
   CC: red urine and a knot on his left leg  HPI:[name] is a 43 y.o. male who presents for evaluation of knot on his left leg. Please see individual problem based A/P for details.  Past Medical History:  Diagnosis Date  . Acute venous embolism and thrombosis of deep vessels of proximal lower extremity ([place]) 07/19/2011  . Anemia   . Chest pain, neg MI, normal coronaries by cath 02/18/2013  . CKD (chronic kidney disease) stage 3, GFR 30-59 ml/min 02/19/2013  . Colon polyps   . Diabetic ulcer of right foot ([place])   . DVT (deep venous thrombosis) ([place]) 09/2002   patient reports additional DVTs in '06 & '11 (unconfirmed)  . GERD (gastroesophageal reflux disease)   . History of blood transfusion    "related to OR" (10/31/2016)  . Hyperlipidemia 02/19/2013  . Hypertension   . Nausea & vomiting    "constant for the last couple weeks" (10/31/2016)  . Nephrotic syndrome   . Obesity    BMI 44, weight 346 pounds 01/30/14  . OSA (obstructive sleep apnea) 02/28/2018   Mild obstructive sleep apnea with an AHI of 9.8/h but severe during REM sleep with an AHI of 33.8/h.  Oxygen saturations dropped to 86% and there was moderate snoring  . Prosthesis adjustment 08/17/2016  . Pulmonary embolism ([place]) 09/2002   treated with 6 months of warfarin  . Pyelonephritis 02/02/2018  . Type I diabetes mellitus ([place]) dx'd 2001   Review of Systems:  ROS negative except as per HPI.  Physical Exam: Vitals:   12/12/18 1355  BP: 133/64  Pulse: 98  Temp: 98.5 F (36.9 C)  TempSrc: Oral  SpO2: 99%  Height: 6\' 4"  (1.93 m)   General: A/O x4, in no acute distress, afebrile, nondiaphoretic HEENT: PEERL, EMO intact Cardio: RRR, no mrg's  Pulmonary: CTA bilaterally, no wheezing or crackles  Abdomen: Bowel sounds normal, soft, nontender  MSK: See HPI Psych: Appropriate affect, not depressed in appearance, engages well  Assessment & Plan:   See Encounters Tab for problem based charting.  Patient discussed  with Dr. Daryll Drown

## 2018-12-13 NOTE — Progress Notes (Signed)
Internal Medicine Clinic Attending  I saw and evaluated the patient.  I personally confirmed the key portions of the history and exam documented by Dr. Harbrecht and I reviewed pertinent patient test results.  The assessment, diagnosis, and plan were formulated together and I agree with the documentation in the resident's note.  

## 2018-12-16 ENCOUNTER — Telehealth: Payer: Self-pay | Admitting: Internal Medicine

## 2018-12-16 DIAGNOSIS — Q141 Congenital malformation of retina: Secondary | ICD-10-CM | POA: Diagnosis not present

## 2018-12-16 DIAGNOSIS — H3581 Retinal edema: Secondary | ICD-10-CM | POA: Diagnosis not present

## 2018-12-16 DIAGNOSIS — H35032 Hypertensive retinopathy, left eye: Secondary | ICD-10-CM | POA: Diagnosis not present

## 2018-12-16 DIAGNOSIS — H35031 Hypertensive retinopathy, right eye: Secondary | ICD-10-CM | POA: Diagnosis not present

## 2018-12-16 DIAGNOSIS — E113211 Type 2 diabetes mellitus with mild nonproliferative diabetic retinopathy with macular edema, right eye: Secondary | ICD-10-CM | POA: Diagnosis not present

## 2018-12-16 LAB — HM DIABETES EYE EXAM

## 2018-12-16 NOTE — Telephone Encounter (Signed)
Notified the patient of POCT UA with positive Hgb. He has requested that the results be sent to Dr. Festus Aloe at [facility]. All questions answered. I agree with following up with Urology.

## 2018-12-18 ENCOUNTER — Other Ambulatory Visit: Payer: Self-pay | Admitting: *Deleted

## 2018-12-18 ENCOUNTER — Ambulatory Visit: Payer: Medicare Other | Admitting: Dietician

## 2018-12-18 MED ORDER — GABAPENTIN 100 MG PO CAPS: 100.0000 mg | ORAL_CAPSULE | Freq: Three times a day (TID) | ORAL | 1 refills | Status: DC

## 2018-12-24 DIAGNOSIS — D631 Anemia in chronic kidney disease: Secondary | ICD-10-CM | POA: Diagnosis not present

## 2018-12-24 DIAGNOSIS — N183 Chronic kidney disease, stage 3 unspecified: Secondary | ICD-10-CM | POA: Diagnosis not present

## 2018-12-24 DIAGNOSIS — N2581 Secondary hyperparathyroidism of renal origin: Secondary | ICD-10-CM | POA: Diagnosis not present

## 2018-12-24 DIAGNOSIS — R3989 Other symptoms and signs involving the genitourinary system: Secondary | ICD-10-CM | POA: Diagnosis not present

## 2018-12-24 DIAGNOSIS — Z794 Long term (current) use of insulin: Secondary | ICD-10-CM | POA: Diagnosis not present

## 2018-12-24 DIAGNOSIS — N390 Urinary tract infection, site not specified: Secondary | ICD-10-CM | POA: Diagnosis not present

## 2018-12-24 DIAGNOSIS — E1122 Type 2 diabetes mellitus with diabetic chronic kidney disease: Secondary | ICD-10-CM | POA: Diagnosis not present

## 2018-12-24 DIAGNOSIS — E876 Hypokalemia: Secondary | ICD-10-CM | POA: Diagnosis not present

## 2018-12-24 DIAGNOSIS — I129 Hypertensive chronic kidney disease with stage 1 through stage 4 chronic kidney disease, or unspecified chronic kidney disease: Secondary | ICD-10-CM | POA: Diagnosis not present

## 2018-12-24 DIAGNOSIS — Z6841 Body Mass Index (BMI) 40.0 and over, adult: Secondary | ICD-10-CM | POA: Diagnosis not present

## 2018-12-25 ENCOUNTER — Ambulatory Visit (INDEPENDENT_AMBULATORY_CARE_PROVIDER_SITE_OTHER): Payer: Medicare Other | Admitting: Psychology

## 2018-12-25 DIAGNOSIS — F509 Eating disorder, unspecified: Secondary | ICD-10-CM

## 2018-12-26 ENCOUNTER — Other Ambulatory Visit: Payer: Self-pay

## 2018-12-26 ENCOUNTER — Ambulatory Visit (HOSPITAL_COMMUNITY)
Admission: RE | Admit: 2018-12-26 | Discharge: 2018-12-26 | Disposition: A | Discharge status: Home / Self Care | Payer: Medicare Other | Source: Ambulatory Visit | Attending: Nephrology | Admitting: Nephrology

## 2018-12-26 VITALS — BP 114/50 | HR 100 | Temp 96.8°F

## 2018-12-26 DIAGNOSIS — D631 Anemia in chronic kidney disease: Secondary | ICD-10-CM | POA: Diagnosis not present

## 2018-12-26 DIAGNOSIS — N183 Chronic kidney disease, stage 3 unspecified: Secondary | ICD-10-CM | POA: Diagnosis present

## 2018-12-26 LAB — POCT HEMOGLOBIN-HEMACUE: Hemoglobin: 10.4 g/dL — ABNORMAL LOW (ref 13.0–17.0)

## 2018-12-26 LAB — FERRITIN: Ferritin: 446 ng/mL — ABNORMAL HIGH (ref 24–336)

## 2018-12-26 LAB — IRON AND TIBC
Iron: 50 ug/dL (ref 45–182)
Saturation Ratios: 18 % (ref 17.9–39.5)
TIBC: 274 ug/dL (ref 250–450)
UIBC: 224 ug/dL

## 2018-12-26 MED ORDER — DARBEPOETIN ALFA 100 MCG/0.5ML IJ SOSY
100.0000 ug | PREFILLED_SYRINGE | INTRAMUSCULAR | Status: DC
  Administered 2018-12-26: 100 ug via SUBCUTANEOUS

## 2018-12-26 MED ORDER — DARBEPOETIN ALFA 100 MCG/0.5ML IJ SOSY
PREFILLED_SYRINGE | INTRAMUSCULAR | Status: AC
  Filled 2018-12-26: qty 0.5

## 2018-12-27 ENCOUNTER — Other Ambulatory Visit: Payer: Self-pay | Admitting: Endocrinology

## 2018-12-31 DIAGNOSIS — R310 Gross hematuria: Secondary | ICD-10-CM | POA: Diagnosis not present

## 2019-01-01 ENCOUNTER — Encounter: Payer: Self-pay | Admitting: Internal Medicine

## 2019-01-01 ENCOUNTER — Ambulatory Visit (INDEPENDENT_AMBULATORY_CARE_PROVIDER_SITE_OTHER): Payer: Medicare Other | Admitting: Internal Medicine

## 2019-01-01 VITALS — BP 145/82 | HR 88 | Temp 98.4°F | Ht 76.0 in

## 2019-01-01 DIAGNOSIS — Z794 Long term (current) use of insulin: Secondary | ICD-10-CM

## 2019-01-01 DIAGNOSIS — E119 Type 2 diabetes mellitus without complications: Secondary | ICD-10-CM

## 2019-01-01 DIAGNOSIS — M25562 Pain in left knee: Secondary | ICD-10-CM | POA: Diagnosis not present

## 2019-01-01 LAB — GLUCOSE, CAPILLARY: Glucose-Capillary: 214 mg/dL — ABNORMAL HIGH (ref 70–99)

## 2019-01-01 LAB — POCT GLYCOSYLATED HEMOGLOBIN (HGB A1C): Hemoglobin A1C: 7.1 % — AB (ref 4.0–5.6)

## 2019-01-01 MED ORDER — TRAMADOL HCL 50 MG PO TABS: 50.0000 mg | ORAL_TABLET | Freq: Three times a day (TID) | ORAL | 0 refills | Status: AC | PRN

## 2019-01-01 NOTE — Assessment & Plan Note (Signed)
Patient sustained an injury to the left knee that resulted in a painful contusion.  He has been using ice and heat at home without any relief.  Has not tried any medications.  States he has not rested much as he is currently going to the gym 5 days/week.  He has not had further trauma since then.  On exam left knee is grossly unremarkable.  There is a medium sized bruise still present that is tender to palpation but there is full range of motion of the left knee joint.  No signs of infection noted.  Discussed this type of injury may take some time to heal.  Recommended rest and/or limiting exercises that require use of the left leg for the next 1-2 weeks. Prescribed Tramadol 60 mg q8h PRN #15 tablets, no RF.

## 2019-01-01 NOTE — Patient Instructions (Signed)
[name],   I expect the pain in your knee will get better with time.  Please make sure you limit exercises using that left leg for the next week or 2.  For the pain I sent a prescription for tramadol that you can take every 8 hours as needed for pain.  Call us if you have any questions or concerns.  - Dr. Frederico Hamman

## 2019-01-01 NOTE — Progress Notes (Signed)
7.1 

## 2019-01-01 NOTE — Assessment & Plan Note (Signed)
Patient requested A1c today.  It is stable at 7.1.  No further intervention.

## 2019-01-01 NOTE — Progress Notes (Signed)
   CC: L knee pain   HPI:  [name] is a 43 y.o. year-old male with PMH listed below who presents to clinic for L knee pain . Please see problem based assessment and plan for further details.   Past Medical History:  Diagnosis Date  . Acute venous embolism and thrombosis of deep vessels of proximal lower extremity ([place]) 07/19/2011  . Anemia   . Chest pain, neg MI, normal coronaries by cath 02/18/2013  . CKD (chronic kidney disease) stage 3, GFR 30-59 ml/min 02/19/2013  . Colon polyps   . Diabetic ulcer of right foot ([place])   . DVT (deep venous thrombosis) ([place]) 09/2002   patient reports additional DVTs in '06 & '11 (unconfirmed)  . GERD (gastroesophageal reflux disease)   . History of blood transfusion    "related to OR" (10/31/2016)  . Hyperlipidemia 02/19/2013  . Hypertension   . Nausea & vomiting    "constant for the last couple weeks" (10/31/2016)  . Nephrotic syndrome   . Obesity    BMI 44, weight 346 pounds 01/30/14  . OSA (obstructive sleep apnea) 02/28/2018   Mild obstructive sleep apnea with an AHI of 9.8/h but severe during REM sleep with an AHI of 33.8/h.  Oxygen saturations dropped to 86% and there was moderate snoring  . Prosthesis adjustment 08/17/2016  . Pulmonary embolism ([place]) 09/2002   treated with 6 months of warfarin  . Pyelonephritis 02/02/2018  . Type I diabetes mellitus ([place]) dx'd 2001   Review of Systems:   Review of Systems  Constitutional: Negative for chills, fever and malaise/fatigue.  Musculoskeletal: Positive for joint pain and myalgias.    Physical Exam:  Vitals:   01/01/19 0930  BP: (!) 145/82  Pulse: 88  Temp: 98.4 F (36.9 C)  TempSrc: Oral  SpO2: 97%  Height: 6\' 4"  (1.93 m)    General: Well-appearing male in no acute distress MSK: Medium sized bruise below the L knee that is tender to palpation.  No erythema or warmth noted.  There is pain with motion of the knee but there is full range of motion present.   Assessment & Plan:    See Encounters Tab for problem based charting.  Patient discussed with Dr. Daryll Drown

## 2019-01-05 NOTE — Progress Notes (Signed)
Internal Medicine Clinic Attending  Case discussed with Dr. Santos-Sanchez at the time of the visit.  We reviewed the resident's history and exam and pertinent patient test results.  I agree with the assessment, diagnosis, and plan of care documented in the resident's note.    

## 2019-01-06 ENCOUNTER — Telehealth: Payer: Self-pay | Admitting: Dietician

## 2019-01-06 ENCOUNTER — Telehealth: Payer: Self-pay

## 2019-01-06 ENCOUNTER — Encounter: Payer: Medicare Other | Admitting: Dietician

## 2019-01-06 NOTE — Telephone Encounter (Signed)
-----   Message from [name], MD sent at 01/05/2019  2:15 PM EST ----- Regarding: appt Patient overdue for appointment.  Please call to schedule no labs

## 2019-01-06 NOTE — Telephone Encounter (Signed)
Patient is scheduled   

## 2019-01-06 NOTE — Telephone Encounter (Signed)
Per office staff, Mr. Callanan was seen by Dr. Tobie Lords office on 12-16-2018. They are faxing the results.

## 2019-01-07 ENCOUNTER — Ambulatory Visit (INDEPENDENT_AMBULATORY_CARE_PROVIDER_SITE_OTHER): Payer: Medicare Other | Admitting: Psychology

## 2019-01-08 ENCOUNTER — Encounter: Payer: Self-pay | Admitting: Dietician

## 2019-01-09 ENCOUNTER — Ambulatory Visit: Payer: Medicare Other | Admitting: Psychology

## 2019-01-13 ENCOUNTER — Encounter: Payer: Self-pay | Admitting: *Deleted

## 2019-01-16 DIAGNOSIS — N5201 Erectile dysfunction due to arterial insufficiency: Secondary | ICD-10-CM | POA: Diagnosis not present

## 2019-01-16 DIAGNOSIS — N3944 Nocturnal enuresis: Secondary | ICD-10-CM | POA: Diagnosis not present

## 2019-01-16 DIAGNOSIS — N3000 Acute cystitis without hematuria: Secondary | ICD-10-CM | POA: Diagnosis not present

## 2019-01-20 ENCOUNTER — Ambulatory Visit: Payer: Self-pay | Admitting: General Surgery

## 2019-01-23 ENCOUNTER — Other Ambulatory Visit: Payer: Self-pay

## 2019-01-23 ENCOUNTER — Ambulatory Visit (HOSPITAL_COMMUNITY)
Admission: RE | Admit: 2019-01-23 | Discharge: 2019-01-23 | Disposition: A | Discharge status: Home / Self Care | Payer: Medicare Other | Source: Ambulatory Visit | Attending: Nephrology | Admitting: Nephrology

## 2019-01-23 VITALS — BP 132/72 | HR 94 | Temp 95.1°F | Resp 18

## 2019-01-23 DIAGNOSIS — D631 Anemia in chronic kidney disease: Secondary | ICD-10-CM | POA: Diagnosis not present

## 2019-01-23 DIAGNOSIS — N183 Chronic kidney disease, stage 3 unspecified: Secondary | ICD-10-CM | POA: Insufficient documentation

## 2019-01-23 LAB — IRON AND TIBC
Iron: 49 ug/dL (ref 45–182)
Saturation Ratios: 17 % — ABNORMAL LOW (ref 17.9–39.5)
TIBC: 286 ug/dL (ref 250–450)
UIBC: 237 ug/dL

## 2019-01-23 LAB — FERRITIN: Ferritin: 553 ng/mL — ABNORMAL HIGH (ref 24–336)

## 2019-01-23 MED ORDER — DARBEPOETIN ALFA 100 MCG/0.5ML IJ SOSY
PREFILLED_SYRINGE | INTRAMUSCULAR | Status: AC
  Administered 2019-01-23: 13:00:00 100 ug via SUBCUTANEOUS
  Filled 2019-01-23: qty 0.5

## 2019-01-23 MED ORDER — DARBEPOETIN ALFA 100 MCG/0.5ML IJ SOSY: 100.0000 ug | PREFILLED_SYRINGE | INTRAMUSCULAR | Status: DC

## 2019-01-24 ENCOUNTER — Other Ambulatory Visit: Payer: Self-pay | Admitting: Endocrinology

## 2019-01-24 LAB — POCT HEMOGLOBIN-HEMACUE: Hemoglobin: 11 g/dL — ABNORMAL LOW (ref 13.0–17.0)

## 2019-01-27 ENCOUNTER — Ambulatory Visit: Payer: Medicare Other

## 2019-01-28 ENCOUNTER — Other Ambulatory Visit: Payer: Self-pay

## 2019-01-28 ENCOUNTER — Encounter: Payer: Self-pay | Admitting: Endocrinology

## 2019-01-28 ENCOUNTER — Encounter: Payer: Medicare Other | Admitting: Endocrinology

## 2019-01-28 NOTE — Progress Notes (Signed)
This encounter was created in error - please disregard.

## 2019-01-29 ENCOUNTER — Encounter: Payer: Self-pay | Admitting: Dietician

## 2019-01-29 ENCOUNTER — Telehealth: Payer: Self-pay | Admitting: Internal Medicine

## 2019-01-29 ENCOUNTER — Ambulatory Visit (INDEPENDENT_AMBULATORY_CARE_PROVIDER_SITE_OTHER): Payer: Medicare Other | Admitting: Dietician

## 2019-01-29 ENCOUNTER — Other Ambulatory Visit: Payer: Self-pay

## 2019-01-29 ENCOUNTER — Other Ambulatory Visit: Payer: Self-pay | Admitting: Dietician

## 2019-01-29 DIAGNOSIS — Z713 Dietary counseling and surveillance: Secondary | ICD-10-CM | POA: Diagnosis not present

## 2019-01-29 DIAGNOSIS — E119 Type 2 diabetes mellitus without complications: Secondary | ICD-10-CM

## 2019-01-29 DIAGNOSIS — Z794 Long term (current) use of insulin: Secondary | ICD-10-CM

## 2019-01-29 NOTE — Telephone Encounter (Signed)
Pt contact pt [phone], pt needs a new sleeve for his prosthetic leg

## 2019-01-29 NOTE — Progress Notes (Signed)
Medical Nutrition Therapy :  Appt start time: 7824 end time:  2353 Total time: 27 minutes Visit # 11 Telephone visit due to COVID-19.  This is a telephone encounter between [name]  and [name]  on 01/29/2019 for Medical Nutrition Therapy . The visit was conducted with the patient located at home and Debera Lat  at [facility]. The patient's identity was confirmed using their DOB and current address. The patient has consented to being evaluated through a telephone encounter and understands the associated risks / benefits (allows the patient to remain at home, decreasing exposure to coronavirus). I personally spent 27 minutes on medical nutrition therapy discussion.   The following statements were read to the patient and/or legal guardian that are established with the Medical Nutrition therapy Provider.   "The purpose of this phone visit is to provide nutritional health care while limiting exposure to the coronavirus (COVID19).  There is a possibility of technology failure and discussed alternative modes of communication if that failure occurs."   "By engaging in this telephone visit, you consent to the provision of healthcare.  Additionally, you authorize for your insurance to be billed for the services provided during this telephone visit."    Patient  consented to telephone visit: yes  Assessment:   [name] reports success at maintaining a level of stability in his lifestyle changes/self care. He is trying to limit bread, reports bowels are better with including more fiber and he is not skipping meals. He is back going to the gym 5 days a week which he says gives him a better work out.   Feels he can still work on his discipline   BLOOD SUGAR: He reports checking his blood sugars 3x/day more consistently with a  range of 79-210mg /dL; 07-22-28 averages are 180, his 90 day average is 158.   Reports trying to do better checking blood sugars.   DIETARY INTAKE:  4 am cornflakes & raisin  bran with almond milk Snack- protein bar or mini-bagel with powerade Noon- bowl of soup Snack- chips Dinner grilled cheese sandwich Beverages- water mostly, some sugar free lemonade, ~ 10 oz/day powerade   PHYSICAL ACTIVITY:  Progress Towards Goal(s):  Some progress.     Intervention:  nutrition and diabetes support for evaluating his own progress and what behavior(s) are/is causing the change.  Action Goal: decrease bread more, continue checking blood sugar, improve endurance and intensity of exercise Outcome goal:Lose weight and get adjusted to healthy lifestyle he will experience post-op Coordination of care: none this visit Teaching Method Utilized: Auditory Handouts given during visit include:none per patient request Barriers to learning/adherence to lifestyle change: competing values/demands, schooling, transportation, mobility, lack interest in of self-monitoring Demonstrated degree of understanding via:  Teach Back  Monitoring/Evaluation:  Dietary intake, exercise, meter,  in 1 week(s) Debera Lat, RD 01/29/2019 2:14 PM.

## 2019-01-29 NOTE — Progress Notes (Signed)
Request 2021 referral for Medical Nutrition Therapy and Diabetes Self Management Training and support.  Debera Lat, RD 01/29/2019 3:44 PM.

## 2019-01-31 NOTE — Telephone Encounter (Signed)
Returned call to patient. No answer. Left message on VM requesting return call. L. Johnjoseph Rolfe, RN, BSN     

## 2019-02-03 NOTE — Telephone Encounter (Signed)
Pt is returning your call, pls contact

## 2019-02-05 NOTE — Telephone Encounter (Signed)
Returned call to patient. No answer. Left message on VM requesting return call. L. [place], RN, BSN     

## 2019-02-06 ENCOUNTER — Ambulatory Visit: Payer: Medicare Other | Admitting: Dietician

## 2019-02-11 ENCOUNTER — Inpatient Hospital Stay (HOSPITAL_COMMUNITY): Admission: RE | Admit: 2019-02-11 | Payer: Medicare Other | Source: Home / Self Care | Admitting: General Surgery

## 2019-02-11 ENCOUNTER — Encounter (HOSPITAL_COMMUNITY): Admission: RE | Payer: Self-pay | Source: Home / Self Care

## 2019-02-11 SURGERY — LAPAROSCOPIC ROUX-EN-Y GASTRIC BYPASS WITH UPPER ENDOSCOPY
Anesthesia: General

## 2019-02-19 DIAGNOSIS — N5201 Erectile dysfunction due to arterial insufficiency: Secondary | ICD-10-CM | POA: Diagnosis not present

## 2019-02-19 DIAGNOSIS — N3944 Nocturnal enuresis: Secondary | ICD-10-CM | POA: Diagnosis not present

## 2019-02-19 DIAGNOSIS — N318 Other neuromuscular dysfunction of bladder: Secondary | ICD-10-CM | POA: Diagnosis not present

## 2019-02-20 ENCOUNTER — Ambulatory Visit (HOSPITAL_COMMUNITY)
Admission: RE | Admit: 2019-02-20 | Discharge: 2019-02-20 | Disposition: A | Discharge status: Home / Self Care | Payer: Medicare Other | Source: Ambulatory Visit | Attending: Nephrology | Admitting: Nephrology

## 2019-02-20 ENCOUNTER — Other Ambulatory Visit: Payer: Self-pay

## 2019-02-20 VITALS — BP 130/86 | HR 85 | Temp 97.3°F | Resp 18

## 2019-02-20 DIAGNOSIS — N183 Chronic kidney disease, stage 3 unspecified: Secondary | ICD-10-CM | POA: Diagnosis not present

## 2019-02-20 DIAGNOSIS — D631 Anemia in chronic kidney disease: Secondary | ICD-10-CM | POA: Diagnosis not present

## 2019-02-20 LAB — IRON AND TIBC
Iron: 54 ug/dL (ref 45–182)
Saturation Ratios: 19 % (ref 17.9–39.5)
TIBC: 280 ug/dL (ref 250–450)
UIBC: 226 ug/dL

## 2019-02-20 LAB — FERRITIN: Ferritin: 403 ng/mL — ABNORMAL HIGH (ref 24–336)

## 2019-02-20 LAB — POCT HEMOGLOBIN-HEMACUE: Hemoglobin: 10.1 g/dL — ABNORMAL LOW (ref 13.0–17.0)

## 2019-02-20 MED ORDER — DARBEPOETIN ALFA 100 MCG/0.5ML IJ SOSY: 100.0000 ug | PREFILLED_SYRINGE | INTRAMUSCULAR | Status: DC

## 2019-02-20 MED ORDER — DARBEPOETIN ALFA 100 MCG/0.5ML IJ SOSY
PREFILLED_SYRINGE | INTRAMUSCULAR | Status: AC
  Administered 2019-02-20: 100 ug via SUBCUTANEOUS
  Filled 2019-02-20: qty 0.5

## 2019-02-23 ENCOUNTER — Other Ambulatory Visit: Payer: Self-pay | Admitting: Internal Medicine

## 2019-02-26 ENCOUNTER — Other Ambulatory Visit: Payer: Self-pay

## 2019-02-26 ENCOUNTER — Ambulatory Visit (INDEPENDENT_AMBULATORY_CARE_PROVIDER_SITE_OTHER): Payer: Medicare Other | Admitting: Internal Medicine

## 2019-02-26 ENCOUNTER — Encounter: Payer: Self-pay | Admitting: Internal Medicine

## 2019-02-26 DIAGNOSIS — Z9713 Presence of artificial right leg (complete) (partial): Secondary | ICD-10-CM | POA: Diagnosis not present

## 2019-02-26 DIAGNOSIS — Z89611 Acquired absence of right leg above knee: Secondary | ICD-10-CM

## 2019-02-26 DIAGNOSIS — S78111A Complete traumatic amputation at level between right hip and knee, initial encounter: Secondary | ICD-10-CM

## 2019-02-26 DIAGNOSIS — Z44121 Encounter for fitting and adjustment of partial artificial right leg: Secondary | ICD-10-CM

## 2019-02-26 DIAGNOSIS — M25562 Pain in left knee: Secondary | ICD-10-CM

## 2019-02-26 NOTE — Telephone Encounter (Signed)
Order for prosthetic sleeve along with today's OV note faxed to [employer] at [phone]. Confirmation receipt received. Hubbard Hartshorn, BSN, RN-BC

## 2019-02-26 NOTE — Telephone Encounter (Signed)
Spoke with patient. States he has lost weight and his sleeve for his prosthetic leg is no longer fitting properly. 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 Call placed to [employer].

## 2019-02-26 NOTE — Progress Notes (Signed)
  [facility] Internal Medicine Residency Telephone Encounter Continuity Care Appointment  HPI:   This telephone encounter was created for [name] on 02/26/2019 for the following purpose/cc: Request for prosthetic sleeve.   Past Medical History:  Past Medical History:  Diagnosis Date  . Acute venous embolism and thrombosis of deep vessels of proximal lower extremity ([place]) 07/19/2011  . Anemia   . Chest pain, neg MI, normal coronaries by cath 02/18/2013  . CKD (chronic kidney disease) stage 3, GFR 30-59 ml/min 02/19/2013  . Colon polyps   . Diabetic ulcer of right foot ([place])   . DVT (deep venous thrombosis) ([place]) 09/2002   patient reports additional DVTs in '06 & '11 (unconfirmed)  . GERD (gastroesophageal reflux disease)   . History of blood transfusion    "related to OR" (10/31/2016)  . Hyperlipidemia 02/19/2013  . Hypertension   . Nausea & vomiting    "constant for the last couple weeks" (10/31/2016)  . Nephrotic syndrome   . Obesity    BMI 44, weight 346 pounds 01/30/14  . OSA (obstructive sleep apnea) 02/28/2018   Mild obstructive sleep apnea with an AHI of 9.8/h but severe during REM sleep with an AHI of 33.8/h.  Oxygen saturations dropped to 86% and there was moderate snoring  . Prosthesis adjustment 08/17/2016  . Pulmonary embolism ([place]) 09/2002   treated with 6 months of warfarin  . Pyelonephritis 02/02/2018  . Type I diabetes mellitus ([place]) dx'd 2001      ROS:   As per HPI   Assessment / Plan / Recommendations:   Please see A&P under problem oriented charting for assessment of the patient's acute and chronic medical conditions.   As always, pt is advised that if symptoms worsen or new symptoms arise, they should go to an urgent care facility or to to ER for further evaluation.   Consent and Medical Decision Making:   Patient discussed with Dr. Lynnae January  This is a telephone encounter between [name] and [name] on 02/26/2019 for requesting prostatic  sleeve. The visit was conducted with the patient located at home and [name] at [facility]. The patient's identity was confirmed using their DOB and current address. The pateint has consented to being evaluated through a telephone encounter and understands the associated risks (an examination cannot be done and the patient may need to come in for an appointment) / benefits (allows the patient to remain at home, decreasing exposure to coronavirus). I personally spent 6 minutes on medical discussion.

## 2019-02-26 NOTE — Telephone Encounter (Signed)
Spoke with [name] at [place]. States they will need Rx for sleeve as well as OV notes. Patient scheduled in [facility] today for telehealth appt to discuss need for new sleeve. Hubbard Hartshorn, BSN, RN-BC

## 2019-02-26 NOTE — Progress Notes (Signed)
Internal Medicine Clinic Attending  Case discussed with Dr. Agyei at the time of the visit.  We reviewed the resident's history and exam and pertinent patient test results.  I agree with the assessment, diagnosis, and plan of care documented in the resident's note.    

## 2019-02-26 NOTE — Assessment & Plan Note (Addendum)
[name] is a 44 year old gentleman with medical history of right AKA whom I had the pleasure of speaking to on the phone to request for new prosthetic sleeves.  He states that he recently lost weight and his prosthetic space does not fit anymore.  He has been going to the gym however he states has been on home wounds to his physical activities as they keep sliding down.  Otherwise he is doing well.   Plan: -Will write prescription for prosthetic sleeves.

## 2019-03-02 ENCOUNTER — Other Ambulatory Visit: Payer: Self-pay | Admitting: Internal Medicine

## 2019-03-04 DIAGNOSIS — Q141 Congenital malformation of retina: Secondary | ICD-10-CM | POA: Diagnosis not present

## 2019-03-04 DIAGNOSIS — H3581 Retinal edema: Secondary | ICD-10-CM | POA: Diagnosis not present

## 2019-03-04 DIAGNOSIS — H35031 Hypertensive retinopathy, right eye: Secondary | ICD-10-CM | POA: Diagnosis not present

## 2019-03-04 DIAGNOSIS — E113291 Type 2 diabetes mellitus with mild nonproliferative diabetic retinopathy without macular edema, right eye: Secondary | ICD-10-CM | POA: Diagnosis not present

## 2019-03-11 ENCOUNTER — Other Ambulatory Visit: Payer: Self-pay | Admitting: Endocrinology

## 2019-03-11 NOTE — Telephone Encounter (Signed)
Standard Written Order for prosthetic sleeve signed by Attending and faxed to [employer] at [phone]. Confirmation receipt received. Hubbard Hartshorn, BSN, RN-BC

## 2019-03-12 DIAGNOSIS — S88919A Complete traumatic amputation of unspecified lower leg, level unspecified, initial encounter: Secondary | ICD-10-CM | POA: Diagnosis not present

## 2019-03-12 DIAGNOSIS — I8290 Acute embolism and thrombosis of unspecified vein: Secondary | ICD-10-CM | POA: Diagnosis not present

## 2019-03-12 DIAGNOSIS — E119 Type 2 diabetes mellitus without complications: Secondary | ICD-10-CM | POA: Diagnosis not present

## 2019-03-13 DIAGNOSIS — E1122 Type 2 diabetes mellitus with diabetic chronic kidney disease: Secondary | ICD-10-CM | POA: Diagnosis not present

## 2019-03-13 DIAGNOSIS — G4730 Sleep apnea, unspecified: Secondary | ICD-10-CM | POA: Diagnosis not present

## 2019-03-13 DIAGNOSIS — Z794 Long term (current) use of insulin: Secondary | ICD-10-CM | POA: Diagnosis not present

## 2019-03-13 DIAGNOSIS — D631 Anemia in chronic kidney disease: Secondary | ICD-10-CM | POA: Diagnosis not present

## 2019-03-13 DIAGNOSIS — Z6841 Body Mass Index (BMI) 40.0 and over, adult: Secondary | ICD-10-CM | POA: Diagnosis not present

## 2019-03-13 DIAGNOSIS — I129 Hypertensive chronic kidney disease with stage 1 through stage 4 chronic kidney disease, or unspecified chronic kidney disease: Secondary | ICD-10-CM | POA: Diagnosis not present

## 2019-03-13 DIAGNOSIS — R310 Gross hematuria: Secondary | ICD-10-CM | POA: Diagnosis not present

## 2019-03-13 DIAGNOSIS — N183 Chronic kidney disease, stage 3 unspecified: Secondary | ICD-10-CM | POA: Diagnosis not present

## 2019-03-13 DIAGNOSIS — N2581 Secondary hyperparathyroidism of renal origin: Secondary | ICD-10-CM | POA: Diagnosis not present

## 2019-03-18 ENCOUNTER — Other Ambulatory Visit: Payer: Self-pay | Admitting: Endocrinology

## 2019-03-18 ENCOUNTER — Other Ambulatory Visit: Payer: Self-pay | Admitting: Internal Medicine

## 2019-03-20 ENCOUNTER — Other Ambulatory Visit: Payer: Self-pay

## 2019-03-20 ENCOUNTER — Ambulatory Visit: Payer: Medicare Other | Admitting: Dietician

## 2019-03-20 ENCOUNTER — Ambulatory Visit (HOSPITAL_COMMUNITY)
Admission: RE | Admit: 2019-03-20 | Discharge: 2019-03-20 | Disposition: A | Discharge status: Home / Self Care | Payer: Medicare Other | Source: Ambulatory Visit | Attending: Nephrology | Admitting: Nephrology

## 2019-03-20 VITALS — BP 145/69 | HR 92

## 2019-03-20 DIAGNOSIS — N183 Chronic kidney disease, stage 3 unspecified: Secondary | ICD-10-CM | POA: Insufficient documentation

## 2019-03-20 DIAGNOSIS — D631 Anemia in chronic kidney disease: Secondary | ICD-10-CM | POA: Insufficient documentation

## 2019-03-20 LAB — POCT HEMOGLOBIN-HEMACUE: Hemoglobin: 11 g/dL — ABNORMAL LOW (ref 13.0–17.0)

## 2019-03-20 LAB — IRON AND TIBC
Iron: 56 ug/dL (ref 45–182)
Saturation Ratios: 19 % (ref 17.9–39.5)
TIBC: 295 ug/dL (ref 250–450)
UIBC: 239 ug/dL

## 2019-03-20 LAB — FERRITIN: Ferritin: 443 ng/mL — ABNORMAL HIGH (ref 24–336)

## 2019-03-20 MED ORDER — DARBEPOETIN ALFA 100 MCG/0.5ML IJ SOSY: 100.0000 ug | PREFILLED_SYRINGE | INTRAMUSCULAR | Status: DC

## 2019-03-20 MED ORDER — DARBEPOETIN ALFA 100 MCG/0.5ML IJ SOSY
PREFILLED_SYRINGE | INTRAMUSCULAR | Status: AC
  Administered 2019-03-20: 100 ug
  Filled 2019-03-20: qty 0.5

## 2019-03-20 NOTE — Progress Notes (Signed)
Rescheduled this appointment for next week.

## 2019-03-27 ENCOUNTER — Other Ambulatory Visit: Payer: Self-pay

## 2019-03-27 ENCOUNTER — Ambulatory Visit (INDEPENDENT_AMBULATORY_CARE_PROVIDER_SITE_OTHER): Payer: Medicare Other | Admitting: Dietician

## 2019-03-27 ENCOUNTER — Telehealth: Payer: Self-pay | Admitting: Dietician

## 2019-03-27 DIAGNOSIS — E119 Type 2 diabetes mellitus without complications: Secondary | ICD-10-CM

## 2019-03-27 NOTE — Telephone Encounter (Signed)
Patient requested to switch to telehealth for today's appointment

## 2019-03-27 NOTE — Progress Notes (Signed)
Medical Nutrition Therapy Start time 1320 End time  1350  This is a telephone encounter between [name] and [name]  on 03/27/2019 for Medical Nutrition Therapy. The visit was conducted with the patient located at home and [name]  at [facility]. The patient's identity was confirmed using their DOB and current address. The patient has consented to being evaluated through a telephone encounter and understands the associated risks / benefits (allows the patient to remain at home, decreasing exposure to coronavirus). I personally spent 30 minutes on medical nutrition therapy discussion.   The following statements were read to the patient and/or legal guardian that are established with the Nutritional Health Provider.   "The purpose of this phone visit is to provide behavioral health care while limiting exposure to the coronavirus (COVID19).  There is a possibility of technology failure and discussed alternative modes of communication if that failure occurs."   "By engaging in this telephone visit, you consent to the provision of healthcare.  Additionally, you authorize for your insurance to be billed for the services provided during this telephone visit."    Patient and/or legal guardian consented to telephone visit: yes  Assessment:  Patient reports more energy, more discipline and focus. No surgery date as yet.   Action Goal follow up from last visit:  1-  decrease bread more- He gives himself a 9/10 on this goal, "carbs are the devil". We discussed carbs are needed for energy. Portions and quality matter 2- Discipline/consistency-staying focused,elimated junk in home. Is continuing to do well checking blood sugar- 3-4x day  With  7 day average 172  And  14 day average -139 (34 readings)  3- improve endurance and intensity of exercise-the new routine has helped his endurance not huffing and puffing,not taking breaks,recovery time improved Outcome goal:Lose weight (has not weighed) and  get adjusted to healthy lifestyle he will experience post-op ( he feels this is improving)   24-hr recall suggests intake is adequate and balanced  (Up at  AM)10-11 pm, 5 am  B ( AM)- bowl raisin bran, 1/2 c oj, 2% milk  Snk ( AM)- grits, diced chicekn  L ( PM)-1/2 grilled chicken salad,cranberreis, parmesan, raspberry vin, match carrots   Snk ( PM)-   D ( PM)- Peas & rice,water red wine Snk ( PM)-   Typical day? Yes.     Nutritional Diagnosis:  NB-1.1 Food and nutrition-related knowledge deficit As related to lack of sufficient prior exposure to diabetes meal planning  As evidenced by his questions and report.  Progress Towards Goal(s):  Some progress.                         Intervention:  nutrition and diabetes support for evaluating his own progress and what behavior(s) are/is causing the change  Monitoring and Evaluation: weight, intake, blood sugars and A1C: follow up 1 week per patient

## 2019-04-03 ENCOUNTER — Ambulatory Visit: Payer: Medicare Other | Admitting: Dietician

## 2019-04-15 ENCOUNTER — Other Ambulatory Visit: Payer: Self-pay | Admitting: Endocrinology

## 2019-04-15 ENCOUNTER — Other Ambulatory Visit: Payer: Self-pay | Admitting: Internal Medicine

## 2019-04-17 ENCOUNTER — Other Ambulatory Visit: Payer: Self-pay

## 2019-04-17 ENCOUNTER — Ambulatory Visit (HOSPITAL_COMMUNITY)
Admission: RE | Admit: 2019-04-17 | Discharge: 2019-04-17 | Disposition: A | Discharge status: Home / Self Care | Payer: Medicare Other | Source: Ambulatory Visit | Attending: Nephrology | Admitting: Nephrology

## 2019-04-17 ENCOUNTER — Telehealth: Payer: Self-pay | Admitting: Dietician

## 2019-04-17 VITALS — BP 152/82 | HR 88 | Temp 96.6°F | Resp 18

## 2019-04-17 DIAGNOSIS — N183 Chronic kidney disease, stage 3 unspecified: Secondary | ICD-10-CM | POA: Diagnosis not present

## 2019-04-17 DIAGNOSIS — D631 Anemia in chronic kidney disease: Secondary | ICD-10-CM | POA: Insufficient documentation

## 2019-04-17 LAB — IRON AND TIBC
Iron: 39 ug/dL — ABNORMAL LOW (ref 45–182)
Saturation Ratios: 14 % — ABNORMAL LOW (ref 17.9–39.5)
TIBC: 288 ug/dL (ref 250–450)
UIBC: 249 ug/dL

## 2019-04-17 LAB — FERRITIN: Ferritin: 361 ng/mL — ABNORMAL HIGH (ref 24–336)

## 2019-04-17 LAB — POCT HEMOGLOBIN-HEMACUE: Hemoglobin: 9.8 g/dL — ABNORMAL LOW (ref 13.0–17.0)

## 2019-04-17 MED ORDER — DARBEPOETIN ALFA 100 MCG/0.5ML IJ SOSY: 100.0000 ug | PREFILLED_SYRINGE | INTRAMUSCULAR | Status: DC

## 2019-04-17 MED ORDER — DARBEPOETIN ALFA 100 MCG/0.5ML IJ SOSY
PREFILLED_SYRINGE | INTRAMUSCULAR | Status: AC
  Administered 2019-04-17: 100 ug via SUBCUTANEOUS
  Filled 2019-04-17: qty 0.5

## 2019-04-17 NOTE — Telephone Encounter (Signed)
[name] calls for assistance in getting in contact with [facility] and to schedule an appointment.

## 2019-04-18 ENCOUNTER — Other Ambulatory Visit: Payer: Self-pay

## 2019-04-18 ENCOUNTER — Ambulatory Visit (INDEPENDENT_AMBULATORY_CARE_PROVIDER_SITE_OTHER): Payer: Medicare Other | Admitting: Internal Medicine

## 2019-04-18 ENCOUNTER — Encounter: Payer: Self-pay | Admitting: Dietician

## 2019-04-18 ENCOUNTER — Encounter: Payer: Self-pay | Admitting: Internal Medicine

## 2019-04-18 ENCOUNTER — Ambulatory Visit (INDEPENDENT_AMBULATORY_CARE_PROVIDER_SITE_OTHER): Payer: Medicare Other | Admitting: Dietician

## 2019-04-18 VITALS — Wt 376.4 lb

## 2019-04-18 DIAGNOSIS — E119 Type 2 diabetes mellitus without complications: Secondary | ICD-10-CM | POA: Diagnosis not present

## 2019-04-18 DIAGNOSIS — Z794 Long term (current) use of insulin: Secondary | ICD-10-CM | POA: Diagnosis not present

## 2019-04-18 DIAGNOSIS — Z713 Dietary counseling and surveillance: Secondary | ICD-10-CM

## 2019-04-18 DIAGNOSIS — E11649 Type 2 diabetes mellitus with hypoglycemia without coma: Secondary | ICD-10-CM | POA: Diagnosis not present

## 2019-04-18 LAB — GLUCOSE, CAPILLARY
Glucose-Capillary: 50 mg/dL — ABNORMAL LOW (ref 70–99)
Glucose-Capillary: 62 mg/dL — ABNORMAL LOW (ref 70–99)
Glucose-Capillary: 93 mg/dL (ref 70–99)

## 2019-04-18 LAB — POCT GLYCOSYLATED HEMOGLOBIN (HGB A1C): Hemoglobin A1C: 7.3 % — AB (ref 4.0–5.6)

## 2019-04-18 NOTE — Progress Notes (Signed)
   CC: Diabetes Management  HPI:  [name] is a 44 y.o. male, with PMH below, who presents to the [facility] for follow up on his diabetes management. To see the management of his acute and chronic illnesses see the attached A&P under the encounters tab.   Past Medical History:  Diagnosis Date  . Acute venous embolism and thrombosis of deep vessels of proximal lower extremity ([place]) 07/19/2011  . Anemia   . Chest pain, neg MI, normal coronaries by cath 02/18/2013  . CKD (chronic kidney disease) stage 3, GFR 30-59 ml/min 02/19/2013  . Colon polyps   . Diabetic ulcer of right foot ([place])   . DVT (deep venous thrombosis) ([place]) 09/2002   patient reports additional DVTs in '06 & '11 (unconfirmed)  . GERD (gastroesophageal reflux disease)   . History of blood transfusion    "related to OR" (10/31/2016)  . Hyperlipidemia 02/19/2013  . Hypertension   . Nausea & vomiting    "constant for the last couple weeks" (10/31/2016)  . Nephrotic syndrome   . Obesity    BMI 44, weight 346 pounds 01/30/14  . OSA (obstructive sleep apnea) 02/28/2018   Mild obstructive sleep apnea with an AHI of 9.8/h but severe during REM sleep with an AHI of 33.8/h.  Oxygen saturations dropped to 86% and there was moderate snoring  . Prosthesis adjustment 08/17/2016  . Pulmonary embolism ([place]) 09/2002   treated with 6 months of warfarin  . Pyelonephritis 02/02/2018  . Type I diabetes mellitus ([place]) dx'd 2001   Review of Systems:   Review of Systems  Constitutional: Negative for chills, diaphoresis, malaise/fatigue and weight loss.  Respiratory: Negative for hemoptysis, sputum production and shortness of breath.   Cardiovascular: Negative for palpitations, orthopnea and claudication.  Gastrointestinal: Negative for abdominal pain, constipation, diarrhea, nausea and vomiting.  Neurological: Positive for dizziness. Negative for tingling, tremors, weakness and headaches.    Physical Exam:  There were no vitals filed for  this visit. Physical Exam Constitutional:      General: He is not in acute distress.    Appearance: Normal appearance. He is not diaphoretic.  HENT:     Head: Normocephalic and atraumatic.  Eyes:     General:        Right eye: No discharge.        Left eye: No discharge.     Conjunctiva/sclera: Conjunctivae normal.  Cardiovascular:     Rate and Rhythm: Normal rate and regular rhythm.     Pulses: Normal pulses.     Heart sounds: Normal heart sounds. No murmur. No friction rub. No gallop.   Pulmonary:     Effort: Pulmonary effort is normal.     Breath sounds: Normal breath sounds. No wheezing, rhonchi or rales.  Abdominal:     General: Abdomen is flat. Bowel sounds are normal.     Palpations: Abdomen is soft.  Neurological:     Mental Status: He is alert.     Assessment & Plan:   See Encounters Tab for problem based charting.  Patient seen with Dr. Heber [place]

## 2019-04-18 NOTE — Progress Notes (Signed)
Medical Nutrition Therapy Start time 0920 End time  0950   Assessment:  Patient reports for support for meal planning for his upcoming bariatric surgery and diabetes follow up. He was seen in a covisit with Dr. Gilford Rile today. No surgery date as yet.  Unfortunately, he had a low blood sugar of 50 mg/dl. He reported no symptoms. He was treated with 30 grams fast acting carbohydrate, rechecked in 15 minutes with result of 62mg /dl. He was treated again with 15 additional grams of carbohydrate and 15 minute later blood sugar was rechecked with result of 92mg /dl. Patient was warned about possibilities of a repeat episode of hypoglycemia in the next 234 hours an sent home with glucose tablets in hand.  He was provided with contacts at [facility] [place] [facility].  His weight and A1C are stable. He reports swelling today increasing his weight. He also states he had 4 episodes of hypoglycemia this week 3 before lunch and 1 before dinner after waling. was reminded of Dr. Ronnie Derby instructed ofr insuloin adjustment for hypoglycemia. He was able to stand today to be weighed with less effort than previosly.   Patient reports the following supplements daily: Zinc, Ala, Fish oil 1 capsule,Vitamin B 12    Wt Readings from Last 5 Encounters:  04/18/19 (!) 376 lb 6.4 oz (170.7 kg)  11/14/18 (!) 370 lb (167.8 kg)  11/14/18 (!) 370 lb 12.8 oz (168.2 kg)  10/23/18 (!) 370 lb (167.8 kg)  07/24/18 (!) 369 lb 4.8 oz (167.5 kg)    Lab Results  Component Value Date   HGBA1C 7.3 (A) 04/18/2019   HGBA1C 7.1 (A) 01/01/2019   HGBA1C 6.9 (A) 09/19/2018   HGBA1C 7.8 (A) 06/18/2018   HGBA1C 5.6 02/20/2018     Outcome goal:Lose weight (has not weighed) and get adjusted to healthy lifestyle he will experience post-op ( he feels this is improving)    Nutritional Diagnosis:  NB-1.1 Food and nutrition-related knowledge deficit As related to lack of sufficient prior exposure to diabetes meal planning  As evidenced by  his questions and report.  Progress Towards Goal(s):  Some progress.                         Intervention:  Educated about risk of copper deficiency with zinc supplementation. nutrition and diabetes support for increasing self efficacy in self managing his diabetes and obesity  Monitoring and Evaluation: weight, intake, blood sugars: follow up in 4 weeks if surgery still on hold.. Consider checking vitamin D Debera Lat, RD 04/18/2019 2:16 PM.

## 2019-04-18 NOTE — Progress Notes (Signed)
Internal Medicine Clinic Attending  I saw and evaluated the patient.  I personally confirmed the key portions of the history and exam documented by Dr. Winters and I reviewed pertinent patient test results.  The assessment, diagnosis, and plan were formulated together and I agree with the documentation in the resident's note.  

## 2019-04-18 NOTE — Assessment & Plan Note (Signed)
Patient presents to the clinic post AM workout session, with complaints of light headedness. POC CBG shows a blood glucose of 50. Patient was given a course of glucose tablets. Initially given 7 tablets, and after 15 minutes his CBG was rechecked with a blood glucose of 63. Patient instructed to eat 3 more tablets. After an additional 15 minutes his POC CBG was 93. Patient instructed to eat a snack or small meal within the next hour.  Plan:  - Per Dr. Ronnie Derby previous note, patient will decrease morning Novolog dose to 15U.  - Patient instructed to eat a small meal within the next hour.

## 2019-04-18 NOTE — Assessment & Plan Note (Signed)
Patient presents to the office today with a A1c of 7.3 from 7.1. Patient did have a hypoglycemic event event with complaints of light headedness and a cbg of 50 after a work out session this afternoon. Patient currently on Novolog 25U TID with 20U am, 25U afternoon, and 30U pm. Will reduce AM dose to 15U from 20U.  Plan:  - Decrease AM dosage of Novolog to 15U from 20U. Continue rest of medical regimen.  - Will reassess A1c in 3 months.

## 2019-04-18 NOTE — Patient Instructions (Addendum)
It was a pleasure to meet with you today [name]!  Please schedule an appointment with Dr. Dwyane Dee as soon as possible.  This is from Dr. Dwyane Dee at your last visit: If he is getting relatively low readings after breakfast he can reduce his NovoLog to 15 in the morning   Call [facility] surgery.   Follow up with [name] in 4 weeks if surgery is still on hold  [name] [phone]

## 2019-04-22 ENCOUNTER — Other Ambulatory Visit: Payer: Self-pay | Admitting: Internal Medicine

## 2019-04-22 DIAGNOSIS — Z794 Long term (current) use of insulin: Secondary | ICD-10-CM

## 2019-04-22 DIAGNOSIS — E119 Type 2 diabetes mellitus without complications: Secondary | ICD-10-CM

## 2019-04-22 NOTE — Telephone Encounter (Signed)
Refill Request-Please call patient.  Pt broke his Glucometer and is  requesting a Prescription for a new one.  [place] ([place]), [place] - [address]

## 2019-04-22 NOTE — Telephone Encounter (Signed)
Called to be sure he needs Accu-chek meter or different type - no answer; left message.

## 2019-04-23 NOTE — Telephone Encounter (Signed)
Called pt again - stated he uses One Touch Ultra mini.  Please send new rx with supplies.Thanks

## 2019-04-28 ENCOUNTER — Ambulatory Visit: Payer: Self-pay | Admitting: General Surgery

## 2019-05-01 ENCOUNTER — Other Ambulatory Visit: Payer: Self-pay | Admitting: Dietician

## 2019-05-01 DIAGNOSIS — Z794 Long term (current) use of insulin: Secondary | ICD-10-CM

## 2019-05-01 DIAGNOSIS — E119 Type 2 diabetes mellitus without complications: Secondary | ICD-10-CM

## 2019-05-01 MED ORDER — ONETOUCH VERIO VI STRP: ORAL_STRIP | 3 refills | Status: DC

## 2019-05-01 MED ORDER — ONETOUCH VERIO W/DEVICE KIT: PACK | 1 refills | Status: DC

## 2019-05-01 MED ORDER — ONETOUCH ULTRA 2 W/DEVICE KIT: PACK | 0 refills | Status: DC

## 2019-05-01 MED ORDER — ONETOUCH DELICA PLUS LANCET33G MISC: 3 refills | Status: DC

## 2019-05-01 NOTE — Telephone Encounter (Signed)
Left a message at pharmacy to cancel prescriptions for one touch verio meter per patient request

## 2019-05-01 NOTE — Telephone Encounter (Signed)
Left a message for patient to call if he does not want the more accurate One Touch Verio meter and supplies ordered for him.

## 2019-05-01 NOTE — Telephone Encounter (Signed)
[name] calls and specifically requests a meter that uses the one touch ultra strips and lancets he already has. Called l pharmacy and asked them to cancel previous order and change to one touch Ultra 2 meter.

## 2019-05-02 ENCOUNTER — Telehealth: Payer: Self-pay | Admitting: Endocrinology

## 2019-05-05 ENCOUNTER — Encounter: Payer: Medicare Other | Attending: General Surgery | Admitting: Skilled Nursing Facility1

## 2019-05-05 ENCOUNTER — Other Ambulatory Visit: Payer: Self-pay

## 2019-05-05 ENCOUNTER — Ambulatory Visit: Payer: Medicare Other

## 2019-05-05 DIAGNOSIS — Z833 Family history of diabetes mellitus: Secondary | ICD-10-CM | POA: Diagnosis not present

## 2019-05-05 DIAGNOSIS — Z79899 Other long term (current) drug therapy: Secondary | ICD-10-CM | POA: Diagnosis not present

## 2019-05-05 DIAGNOSIS — G4730 Sleep apnea, unspecified: Secondary | ICD-10-CM | POA: Insufficient documentation

## 2019-05-05 DIAGNOSIS — Z794 Long term (current) use of insulin: Secondary | ICD-10-CM | POA: Diagnosis not present

## 2019-05-05 DIAGNOSIS — Z89611 Acquired absence of right leg above knee: Secondary | ICD-10-CM | POA: Diagnosis not present

## 2019-05-05 DIAGNOSIS — Z86718 Personal history of other venous thrombosis and embolism: Secondary | ICD-10-CM | POA: Diagnosis not present

## 2019-05-05 DIAGNOSIS — E119 Type 2 diabetes mellitus without complications: Secondary | ICD-10-CM | POA: Insufficient documentation

## 2019-05-05 DIAGNOSIS — Z7901 Long term (current) use of anticoagulants: Secondary | ICD-10-CM | POA: Insufficient documentation

## 2019-05-05 DIAGNOSIS — I10 Essential (primary) hypertension: Secondary | ICD-10-CM | POA: Diagnosis not present

## 2019-05-05 DIAGNOSIS — Z713 Dietary counseling and surveillance: Secondary | ICD-10-CM | POA: Insufficient documentation

## 2019-05-05 DIAGNOSIS — Z8249 Family history of ischemic heart disease and other diseases of the circulatory system: Secondary | ICD-10-CM | POA: Diagnosis not present

## 2019-05-05 DIAGNOSIS — E785 Hyperlipidemia, unspecified: Secondary | ICD-10-CM | POA: Insufficient documentation

## 2019-05-05 DIAGNOSIS — E669 Obesity, unspecified: Secondary | ICD-10-CM

## 2019-05-05 NOTE — Progress Notes (Signed)
Pre-Operative Nutrition Class:  Appt start time: 8016   End time:  1830.  Patient was seen on 05/05/2019 for Pre-Operative Bariatric Surgery Education at [facility].    Surgery date:  Surgery type: RYGB Start weight at [facility]:  Weight today: pt could not weigh due to not having his prostesis  Samples given per MNT protocol. Patient educated on appropriate usage: Bariatric Advantage Multivitamin Lot # [id number] Exp: 06/22  Bariatric Advantage Calcium  Lot # [id number] Exp: 01/12/2020   The following the learning objectives were met by the patient during this course:  Identify Pre-Op Dietary Goals and will begin 2 weeks pre-operatively  Identify appropriate sources of fluids and proteins   State protein recommendations and appropriate sources pre and post-operatively  Identify Post-Operative Dietary Goals and will follow for 2 weeks post-operatively  Identify appropriate multivitamin and calcium sources  Describe the need for physical activity post-operatively and will follow MD recommendations  State when to call healthcare provider regarding medication questions or post-operative complications  Handouts given during class include:  Pre-Op Bariatric Surgery Diet Handout  Protein Shake Handout  Post-Op Bariatric Surgery Nutrition Handout  BELT Program Information Flyer  Support Group Information Flyer  [facility] Bariatric Supplements Price List  Follow-Up Plan: Patient will follow-up at [facility] 2 weeks post operatively for diet advancement per MD.

## 2019-05-05 NOTE — Telephone Encounter (Signed)
error 

## 2019-05-06 ENCOUNTER — Telehealth: Payer: Self-pay | Admitting: Dietician

## 2019-05-06 DIAGNOSIS — E119 Type 2 diabetes mellitus without complications: Secondary | ICD-10-CM

## 2019-05-06 DIAGNOSIS — Z794 Long term (current) use of insulin: Secondary | ICD-10-CM

## 2019-05-06 MED ORDER — ACCU-CHEK SOFTCLIX LANCETS MISC
3 refills | Status: DC
Start: 2019-05-06 — End: 2019-05-08

## 2019-05-06 MED ORDER — ACCU-CHEK GUIDE ME W/DEVICE KIT: PACK | 1 refills | Status: DC

## 2019-05-06 MED ORDER — ACCU-CHEK GUIDE VI STRP: ORAL_STRIP | 3 refills | Status: DC

## 2019-05-06 NOTE — Telephone Encounter (Addendum)
Says he still does not have a meter, has not heard from his pharmacy about getting the prescription.  I called [facility];  they informed me that the meter will cost him 30.18$ because he has had a meter in the past 5 years his insurance will not pay for another one.  He asks for a free meter coupon and prescriptions for an accu cek guideme  Meter, strips and lancets

## 2019-05-07 ENCOUNTER — Other Ambulatory Visit: Payer: Self-pay | Admitting: Dietician

## 2019-05-07 DIAGNOSIS — E119 Type 2 diabetes mellitus without complications: Secondary | ICD-10-CM

## 2019-05-07 DIAGNOSIS — Z794 Long term (current) use of insulin: Secondary | ICD-10-CM

## 2019-05-07 NOTE — Patient Instructions (Addendum)
DUE TO COVID-19 ONLY ONE VISITOR IS ALLOWED TO COME WITH YOU AND STAY IN THE WAITING ROOM ONLY DURING PRE OP AND PROCEDURE DAY OF SURGERY. THE 2 VISITORS MAY VISIT WITH YOU AFTER SURGERY IN YOUR PRIVATE ROOM DURING VISITING HOURS ONLY!  YOU NEED TO HAVE A COVID 19 TEST ON_4/29/21______ @_2 :05______, THIS TEST MUST BE DONE BEFORE SURGERY, COME  [place], [place] , [id number].  ([place]) ONCE YOUR COVID TEST IS COMPLETED, PLEASE BEGIN THE QUARANTINE INSTRUCTIONS AS OUTLINED IN YOUR HANDOUT.                [name]   Your procedure is scheduled on: 05/12/19   Report to [facility] Main  Entrance   Report to Short Stay at 5:30 AM     Call this number if you have problems the morning of surgery [place], NO CHEWING GUM [place]  . MORNING OF SURGERY DRINK:   DRINK 1 G2 drink BEFORE YOU LEAVE HOME, DRINK ALL OF THE  G2 DRINK AT ONE TIME.  4:30 AM  NO SOLID FOOD AFTER 6:00 PM THE NIGHT BEFORE YOUR SURGERY.   YOU MAY DRINK CLEAR FLUIDS.   THE G2 DRINK YOU DRINK BEFORE YOU LEAVE HOME WILL BE THE LAST FLUIDS YOU DRINK BEFORE SURGERY.  PAIN IS EXPECTED AFTER SURGERY AND WILL NOT BE COMPLETELY ELIMINATED.   AMBULATION AND TYLENOL WILL HELP REDUCE INCISIONAL AND GAS PAIN. MOVEMENT IS KEY!  YOU ARE EXPECTED TO BE OUT OF BED WITHIN 4 HOURS OF ADMISSION TO YOUR PATIENT ROOM.  SITTING IN THE RECLINER THROUGHOUT THE DAY IS IMPORTANT FOR DRINKING FLUIDS AND MOVING GAS THROUGHOUT THE GI TRACT.  COMPRESSION STOCKINGS SHOULD BE WORN [place] UNLESS YOU ARE WALKING.   INCENTIVE SPIROMETER SHOULD BE USED EVERY HOUR WHILE AWAKE TO DECREASE POST-OPERATIVE COMPLICATIONS SUCH AS PNEUMONIA.  WHEN DISCHARGED HOME, IT IS IMPORTANT TO CONTINUE TO WALK EVERY HOUR AND USE THE INCENTIVE SPIROMETER EVERY HOUR.    Take these medicines the morning of surgery with A SIP OF  WATER: Gabapentin, Clonidine, Hydrolazine, Carvedilol, Amlodipine, Allopurinol,Carvedilol, Flomax Oxybutynin  Bring your mask and tubing to the hospital with you  DO NOT TAKE ANY DIABETIC MEDICATIONS DAY OF YOUR SURGERY   How to Manage Your Diabetes Before and After Surgery  Why is it important to control my blood sugar before and after surgery? . Improving blood sugar levels before and after surgery helps healing and can limit problems. . A way of improving blood sugar control is eating a healthy diet by: o  Eating less sugar and carbohydrates o  Increasing activity/exercise o  Talking with your doctor about reaching your blood sugar goals . High blood sugars (greater than 180 mg/dL) can raise your risk of infections and slow your recovery, so you will need to focus on controlling your diabetes during the weeks before surgery. . Make sure that the doctor who takes care of your diabetes knows about your planned surgery including the date and location.  How do I manage my blood sugar before surgery? . Check your blood sugar at least 4 times a day, starting 2 days before surgery, to make sure that the level is not too high or low. o Check your blood sugar the morning of your surgery when you wake up and every 2 hours until you get to the Short Stay unit. Marland Kitchen  If your blood sugar is less than 70 mg/dL, you will need to treat for low blood sugar: o Do not take insulin. o Treat a low blood sugar (less than 70 mg/dL) with  cup of clear juice (cranberry or apple), 4 glucose tablets, OR glucose gel. o Recheck blood sugar in 15 minutes after treatment (to make sure it is greater than 70 mg/dL). If your blood sugar is not greater than 70 mg/dL on recheck, call [phone] for further instructions. . Report your blood sugar to the short stay nurse when you get to Short Stay.  . If you are admitted to the hospital after surgery: o Your blood sugar will be checked by the staff and you will probably be  given insulin after surgery (instead of oral diabetes medicines) to make sure you have good blood sugar levels. o The goal for blood sugar control after surgery is 80-180 mg/dL.   WHAT DO I DO ABOUT MY DIABETES MEDICATION?  Marland Kitchen Do not take oral diabetes medicines (pills) the morning of surgery.   THE DAY BEFORE SURGERY, AM DOSE OF TRESIBA  take 100 units as usual if eating as normally. If not, call your MD.   DO NOT take it the AM of surgery day.  . THE NIGHT BEFORE SURGERY, take  0  units of Novolog  insulin.       . THE MORNING OF SURGERY, take 0 units of Novolog   insulin.  . The day of surgery, do not take other diabetes injectables, including Byetta (exenatide), Bydureon (exenatide ER), Victoza (liraglutide), or Trulicity (dulaglutide).  . If your CBG is greater than 220 mg/dL, you may take  of your sliding scale  . (correction) dose of insulin.                You may not have any metal on your body including              piercings  Do not wear jewelry,  lotions, powders or deodorant              Men may shave face and neck.   Do not bring valuables to the hospital. [place].  Contacts, dentures or bridgework may not be worn into surgery.     Name and phone number of your driver:  Special Instructions: N/A              Please read over the following fact sheets you were given: _____________________________________________________________________  [facility] - Preparing for Surgery Before surgery, you can play an important role.  Because skin is not sterile, your skin needs to be as free of germs as possible.  You can reduce the number of germs on your skin by washing with CHG (chlorahexidine gluconate) soap before surgery.  CHG is an antiseptic cleaner which kills germs and bonds with the skin to continue killing germs even after washing. Please DO NOT use if you have an allergy to CHG or antibacterial soaps.  If your skin  becomes reddened/irritated stop using the CHG and inform your nurse when you arrive at Short Stay. Do not shave (including legs and underarms) for at least 48 hours prior to the first CHG shower.  You may shave your face/neck. Please follow these instructions carefully:  1.  Shower with CHG Soap the night before surgery and the  morning of Surgery.  2.  If you choose to wash your hair, wash your hair first as usual with your  normal  shampoo.  3.  After you shampoo, rinse your hair and body thoroughly to remove the  shampoo.                                        4.  Use CHG as you would any other liquid soap.  You can apply chg directly  to the skin and wash                       Gently with a scrungie or clean washcloth.  5.  Apply the CHG Soap to your body ONLY FROM THE NECK DOWN.   Do not use on face/ open                           Wound or open sores. Avoid contact with eyes, ears mouth and genitals (private parts).                       Wash face,  Genitals (private parts) with your normal soap.             6.  Wash thoroughly, paying special attention to the area where your surgery  will be performed.  7.  Thoroughly rinse your body with warm water from the neck down.  8.  DO NOT shower/wash with your normal soap after using and rinsing off  the CHG Soap.             9.  Pat yourself dry with a clean towel.            10.  Wear clean pajamas.            11.  Place clean sheets on your bed the night of your first shower and do not  sleep with pets. Day of Surgery : Do not apply any lotions/deodorants the morning of surgery.  Please wear clean clothes to the hospital/surgery center.  FAILURE TO FOLLOW THESE INSTRUCTIONS MAY RESULT IN THE CANCELLATION OF YOUR SURGERY PATIENT SIGNATURE_________________________________  NURSE SIGNATURE__________________________________  ________________________________________________________________________   Adam Phenix  An incentive  spirometer is a tool that can help keep your lungs clear and active. This tool measures how well you are filling your lungs with each breath. Taking long deep breaths may help reverse or decrease the chance of developing breathing (pulmonary) problems (especially infection) following:  A long period of time when you are unable to move or be active. BEFORE THE PROCEDURE   If the spirometer includes an indicator to show your best effort, your nurse or respiratory therapist will set it to a desired goal.  If possible, sit up straight or lean slightly forward. Try not to slouch.  Hold the incentive spirometer in an upright position. INSTRUCTIONS FOR USE  1. Sit on the edge of your bed if possible, or sit up as far as you can in bed or on a chair. 2. Hold the incentive spirometer in an upright position. 3. Breathe out normally. 4. Place the mouthpiece in your mouth and seal your lips tightly around it. 5. Breathe in slowly and as deeply as possible, raising the piston or the ball toward the top of the column. 6. Hold your breath for 3-5 seconds or for  as long as possible. Allow the piston or ball to fall to the bottom of the column. 7. Remove the mouthpiece from your mouth and breathe out normally. 8. Rest for a few seconds and repeat Steps 1 through 7 at least 10 times every 1-2 hours when you are awake. Take your time and take a few normal breaths between deep breaths. 9. The spirometer may include an indicator to show your best effort. Use the indicator as a goal to work toward during each repetition. 10. After each set of 10 deep breaths, practice coughing to be sure your lungs are clear. If you have an incision (the cut made at the time of surgery), support your incision when coughing by placing a pillow or rolled up towels firmly against it. Once you are able to get out of bed, walk around indoors and cough well. You may stop using the incentive spirometer when instructed by your caregiver.   RISKS AND COMPLICATIONS  Take your time so you do not get dizzy or light-headed.  If you are in pain, you may need to take or ask for pain medication before doing incentive spirometry. It is harder to take a deep breath if you are having pain. AFTER USE  Rest and breathe slowly and easily.  It can be helpful to keep track of a log of your progress. Your caregiver can provide you with a simple table to help with this. If you are using the spirometer at home, follow these instructions: [place] IF:   You are having difficultly using the spirometer.  You have trouble using the spirometer as often as instructed.  Your pain medication is not giving enough relief while using the spirometer.  You develop fever of 100.5 F (38.1 C) or higher. SEEK IMMEDIATE MEDICAL CARE IF:   You cough up bloody sputum that had not been present before.  You develop fever of 102 F (38.9 C) or greater.  You develop worsening pain at or near the incision site. MAKE SURE YOU:   Understand these instructions.  Will watch your condition.  Will get help right away if you are not doing well or get worse. Document Released: 05/08/2006 Document Revised: 03/20/2011 Document Reviewed: 07/09/2006 [facility] Patient Information 2014 [place], [place].   ________________________________________________________________________

## 2019-05-07 NOTE — Telephone Encounter (Signed)
Patient calls saying pharmacy needs additional information before approving a meter for him. No answer when calling pharmacy or patient

## 2019-05-08 ENCOUNTER — Ambulatory Visit (INDEPENDENT_AMBULATORY_CARE_PROVIDER_SITE_OTHER): Payer: Medicare Other | Admitting: Cardiology

## 2019-05-08 ENCOUNTER — Encounter (HOSPITAL_COMMUNITY): Payer: Self-pay

## 2019-05-08 ENCOUNTER — Encounter (HOSPITAL_COMMUNITY)
Admission: RE | Admit: 2019-05-08 | Discharge: 2019-05-08 | Disposition: A | Discharge status: Home / Self Care | Payer: Medicare Other | Source: Ambulatory Visit | Attending: General Surgery | Admitting: General Surgery

## 2019-05-08 ENCOUNTER — Other Ambulatory Visit: Payer: Self-pay

## 2019-05-08 ENCOUNTER — Telehealth: Payer: Self-pay | Admitting: Cardiology

## 2019-05-08 ENCOUNTER — Other Ambulatory Visit (HOSPITAL_COMMUNITY): Payer: Medicare Other

## 2019-05-08 ENCOUNTER — Encounter (HOSPITAL_COMMUNITY): Payer: Self-pay | Admitting: Physician Assistant

## 2019-05-08 VITALS — BP 144/82 | HR 81 | Ht 74.0 in | Wt 360.0 lb

## 2019-05-08 DIAGNOSIS — G4733 Obstructive sleep apnea (adult) (pediatric): Secondary | ICD-10-CM | POA: Insufficient documentation

## 2019-05-08 DIAGNOSIS — Z86711 Personal history of pulmonary embolism: Secondary | ICD-10-CM | POA: Diagnosis not present

## 2019-05-08 DIAGNOSIS — E785 Hyperlipidemia, unspecified: Secondary | ICD-10-CM | POA: Insufficient documentation

## 2019-05-08 DIAGNOSIS — Z79899 Other long term (current) drug therapy: Secondary | ICD-10-CM | POA: Insufficient documentation

## 2019-05-08 DIAGNOSIS — I5032 Chronic diastolic (congestive) heart failure: Secondary | ICD-10-CM

## 2019-05-08 DIAGNOSIS — E1022 Type 1 diabetes mellitus with diabetic chronic kidney disease: Secondary | ICD-10-CM | POA: Insufficient documentation

## 2019-05-08 DIAGNOSIS — N183 Chronic kidney disease, stage 3 unspecified: Secondary | ICD-10-CM

## 2019-05-08 DIAGNOSIS — N522 Drug-induced erectile dysfunction: Secondary | ICD-10-CM | POA: Diagnosis not present

## 2019-05-08 DIAGNOSIS — Z01812 Encounter for preprocedural laboratory examination: Secondary | ICD-10-CM | POA: Insufficient documentation

## 2019-05-08 DIAGNOSIS — I10 Essential (primary) hypertension: Secondary | ICD-10-CM | POA: Diagnosis not present

## 2019-05-08 DIAGNOSIS — Z6841 Body Mass Index (BMI) 40.0 and over, adult: Secondary | ICD-10-CM | POA: Insufficient documentation

## 2019-05-08 DIAGNOSIS — Z86718 Personal history of other venous thrombosis and embolism: Secondary | ICD-10-CM | POA: Diagnosis not present

## 2019-05-08 DIAGNOSIS — I130 Hypertensive heart and chronic kidney disease with heart failure and stage 1 through stage 4 chronic kidney disease, or unspecified chronic kidney disease: Secondary | ICD-10-CM | POA: Insufficient documentation

## 2019-05-08 HISTORY — DX: Angina pectoris, unspecified: I20.9

## 2019-05-08 LAB — COMPREHENSIVE METABOLIC PANEL
ALT: 21 U/L (ref 0–44)
AST: 20 U/L (ref 15–41)
Albumin: 3.6 g/dL (ref 3.5–5.0)
Alkaline Phosphatase: 80 U/L (ref 38–126)
Anion gap: 9 (ref 5–15)
BUN: 77 mg/dL — ABNORMAL HIGH (ref 6–20)
CO2: 24 mmol/L (ref 22–32)
Calcium: 9.4 mg/dL (ref 8.9–10.3)
Chloride: 102 mmol/L (ref 98–111)
Creatinine, Ser: 4.07 mg/dL — ABNORMAL HIGH (ref 0.61–1.24)
GFR calc Af Amer: 20 mL/min — ABNORMAL LOW (ref >60)
GFR calc non Af Amer: 17 mL/min — ABNORMAL LOW (ref >60)
Glucose, Bld: 183 mg/dL — ABNORMAL HIGH (ref 70–99)
Potassium: 4 mmol/L (ref 3.5–5.1)
Sodium: 135 mmol/L (ref 135–145)
Total Bilirubin: 0.3 mg/dL (ref 0.3–1.2)
Total Protein: 7.6 g/dL (ref 6.5–8.1)

## 2019-05-08 LAB — CBC WITH DIFFERENTIAL/PLATELET
Abs Immature Granulocytes: 0.04 10*3/uL (ref 0.00–0.07)
Basophils Absolute: 0 10*3/uL (ref 0.0–0.1)
Basophils Relative: 0 %
Eosinophils Absolute: 0.3 10*3/uL (ref 0.0–0.5)
Eosinophils Relative: 4 %
HCT: 32.7 % — ABNORMAL LOW (ref 39.0–52.0)
Hemoglobin: 10.3 g/dL — ABNORMAL LOW (ref 13.0–17.0)
Immature Granulocytes: 0 %
Lymphocytes Relative: 22 %
Lymphs Abs: 2 10*3/uL (ref 0.7–4.0)
MCH: 29.9 pg (ref 26.0–34.0)
MCHC: 31.5 g/dL (ref 30.0–36.0)
MCV: 94.8 fL (ref 80.0–100.0)
Monocytes Absolute: 0.9 10*3/uL (ref 0.1–1.0)
Monocytes Relative: 10 %
Neutro Abs: 6.1 10*3/uL (ref 1.7–7.7)
Neutrophils Relative %: 64 %
Platelets: 301 10*3/uL (ref 150–400)
RBC: 3.45 MIL/uL — ABNORMAL LOW (ref 4.22–5.81)
RDW: 14.8 % (ref 11.5–15.5)
WBC: 9.5 10*3/uL (ref 4.0–10.5)
nRBC: 0 % (ref 0.0–0.2)

## 2019-05-08 LAB — GLUCOSE, CAPILLARY: Glucose-Capillary: 169 mg/dL — ABNORMAL HIGH (ref 70–99)

## 2019-05-08 MED ORDER — ACCU-CHEK GUIDE VI STRP: ORAL_STRIP | 3 refills | Status: DC

## 2019-05-08 MED ORDER — ACCU-CHEK SOFTCLIX LANCETS MISC: 3 refills | Status: DC

## 2019-05-08 NOTE — Telephone Encounter (Signed)
Called pharmacy:gave them free meter coupon that they did not get. Meter is now covered. Per pharmacy, Medicare is not allowing Dr. Gilford Rile to sign. Test strips and lancets need a different doctor to sign.

## 2019-05-08 NOTE — Telephone Encounter (Signed)
   Pt was seen by Dr. Radford Pax today, he said he was advise by Dr. Kieth Brightly office, to call Dr. Radford Pax and request a note stating he is cleared to have his surgery this coming Monday. He provided fax # [phone]  Please advise

## 2019-05-08 NOTE — Telephone Encounter (Signed)
Patient notified. He verbalized understanding.

## 2019-05-08 NOTE — Progress Notes (Signed)
I called Pt for his PAT visit call about surgery on 05/12/19. Pt said that he thought it was the 25th of May. He said he would call the office.

## 2019-05-08 NOTE — Progress Notes (Signed)
Cardiology Office Note:    Date:  05/08/2019   ID:  [name], DOB [birth year], MRN [id number]  PCP:  [name], MD  Cardiologist:  No primary care provider on file.    Referring MD: [name], MD   Chief Complaint  Patient presents with  . Hypertension  . Hyperlipidemia  . Congestive Heart Failure    History of Present Illness:    [name] is a 44 y.o. male with a hx of DVT/PE, CKD stage 3, DM, HTN, hyperlipidemia and morbid obesityweighing around 400 pounds. He has a history of chronic diastolic CHF and obstructive sleep apnea.His Epworth sleepiness score was 8. Sleep study done 11/06/2017 showed overall mild obstructive sleep apnea with an AHI of 9.8/h but severe during REM sleep with an AHI of 33.8/h. Oxygen saturations dropped to 86% and there was moderate snoring. He underwent CPAP titration to 16cm H2O.    He is here today for followup and is doing well.  He denies any chest pain or pressure, SOB, DOE, PND, orthopnea, LE edema, dizziness, palpitations or syncope. He is compliant with his meds and is tolerating meds with no SE.  Unfortunately he stopped using his PAP device.  He works nights and then during the day did not sleep well.  He has decided to pursue weight loss surgery and is motivated to lose the weight and get his health back in order.  He is mainly concerned today because he has been having problems with erectile dysfunction.  He is concerned that the Carvedilol is having a side effect.  He has not had his testosterone checked which was low several years ago.  He is going to the gym and walking on the treadmill with his prosthetic leg as well as lifting weights.  He has no anginal symptoms when working out.      Past Medical History:  Diagnosis Date  . Acute venous embolism and thrombosis of deep vessels of proximal lower extremity ([place]) 07/19/2011  . Anemia   . Chest pain, neg MI, normal coronaries by cath 02/18/2013  . CKD (chronic kidney  disease) stage 3, GFR 30-59 ml/min 02/19/2013  . Colon polyps   . Diabetic ulcer of right foot ([place])   . DVT (deep venous thrombosis) ([place]) 09/2002   patient reports additional DVTs in '06 & '11 (unconfirmed)  . GERD (gastroesophageal reflux disease)   . History of blood transfusion    "related to OR" (10/31/2016)  . Hyperlipidemia 02/19/2013  . Hypertension   . Nausea & vomiting    "constant for the last couple weeks" (10/31/2016)  . Nephrotic syndrome   . Obesity    BMI 44, weight 346 pounds 01/30/14  . OSA (obstructive sleep apnea) 02/28/2018   Mild obstructive sleep apnea with an AHI of 9.8/h but severe during REM sleep with an AHI of 33.8/h.  Oxygen saturations dropped to 86% and there was moderate snoring  . Prosthesis adjustment 08/17/2016  . Pulmonary embolism ([place]) 09/2002   treated with 6 months of warfarin  . Pyelonephritis 02/02/2018  . Type I diabetes mellitus ([place]) dx'd 2001    Past Surgical History:  Procedure Laterality Date  . AMPUTATION Right 12/22/2013   Procedure: AMPUTATION BELOW KNEE;  Surgeon: [name], MD;  Location: [facility];  Service: Orthopedics;  Laterality: Right;  . AMPUTATION Right 02/19/2014   Procedure: RIGHT ABOVE KNEE AMPUTATION ;  Surgeon: [name], MD;  Location: [facility];  Service: Orthopedics;  Laterality: Right;  . CARDIAC CATHETERIZATION  02/18/2013   normal coronaries  . ESOPHAGOGASTRODUODENOSCOPY N/A 11/02/2016   Procedure: ESOPHAGOGASTRODUODENOSCOPY (EGD);  Surgeon: [name], MD;  Location[facility] END[facility]COPY;  Service: Endoscopy;  Laterality: N/A;  . I & D EXTREMITY Right 12/14/2013   Procedure: IRRIGATION AND DEBRIDEMENT RIGHT FOOT;  Surgeon: [name], MD;  Location: [facility];  Service: Orthopedics;  Laterality: Right;  . INCISION AND DRAINAGE ABSCESS  2007; 2015   "back"  . INCISION AND DRAINAGE OF WOUND Right 12/22/2013   Procedure: I&D RIGHT BUTTOCK;  Surgeon: [name], MD;  Location: [facility];  Service: Orthopedics;  Laterality: Right;   . LEFT HEART CATHETERIZATION WITH CORONARY ANGIOGRAM N/A 02/18/2013   Procedure: LEFT HEART CATHETERIZATION WITH CORONARY ANGIOGRAM;  Surgeon: [name], MD;  Location: [facility] CATH LAB;  Service: Cardiovascular;  Laterality: N/A;    Current Medications: Current Meds  Medication Sig  . Accu-Chek Softclix Lancets lancets Check blood sugar up to 3 times a day  . acidophilus (RISAQUAD) CAPS capsule Take 1 capsule by mouth daily.  Marland Kitchen allopurinol (ZYLOPRIM) 100 MG tablet Take 2 tablets (200 mg total) by mouth daily.  . Alpha-Lipoic Acid 200 MG CAPS Take 1 capsule (200 mg total) by mouth daily.  . Alpha-Lipoic Acid 600 MG CAPS Take 600 mg by mouth daily.  Marland Kitchen amLODipine (NORVASC) 10 MG tablet Take 10 mg by mouth at bedtime.  Marland Kitchen apixaban (ELIQUIS) 5 MG TABS tablet Take 1 tablet (5 mg total) by mouth 2 (two) times daily.  . Blood Glucose Monitoring Suppl (ACCU-CHEK GUIDE ME) w/Device KIT Use to check blood sugar up to 3 times a day  . calcitRIOL (ROCALTROL) 0.25 MCG capsule Take 1 capsule by mouth once daily (Patient taking differently: Take 0.25 mcg by mouth daily. )  . carvedilol (COREG) 25 MG tablet Take 1 tablet by mouth twice daily (Patient taking differently: Take 25 mg by mouth 2 (two) times daily with a meal. )  . cloNIDine (CATAPRES) 0.3 MG tablet Take 1 tablet (0.3 mg total) by mouth 2 (two) times daily.  . Cyanocobalamin (B-12) 500 MCG SUBL Place 500 mcg under the tongue daily.   . famotidine (PEPCID) 20 MG tablet Take 1 tablet (20 mg total) by mouth 2 (two) times daily. (Patient taking differently: Take 20 mg by mouth 2 (two) times daily as needed for heartburn or indigestion. )  . furosemide (LASIX) 80 MG tablet Take 1 tablet by mouth twice daily (Patient taking differently: Take 80 mg by mouth 2 (two) times daily. )  . gabapentin (NEURONTIN) 100 MG capsule Take 1 capsule (100 mg total) by mouth 3 (three) times daily.  Marland Kitchen glucose blood (ACCU-CHEK GUIDE) test strip Check blood sugar up to 3  times a day  . hydrALAZINE (APRESOLINE) 50 MG tablet Take 1 tablet by mouth 4 times daily (Patient taking differently: Take 50 mg by mouth 4 (four) times daily. )  . insulin aspart (NOVOLOG) 100 UNIT/ML FlexPen Inject 25 Units into the skin 3 (three) times daily with meals. 20 unit morning, 25 units lunch 30 units for dinner (Patient taking differently: Inject 20-30 Units into the skin See admin instructions. Inject 20 unit into the skin in the morning, 25 units in the afternoon and 30 units before bedtime)  . Insulin Pen Needle (B-D UF III MINI PEN NEEDLES) 31G X 5 MM MISC USE  4 TIMES DAILY  . Insulin Pen Needle (PEN NEEDLES 3/16") 31G X 5 MM MISC Use five times daily  . Lancet  Devices (ACCU-CHEK SOFTCLIX) lancets Use to check blood sugars 5 times a day. Dx code:E11.9. Insulin dependent.  . lovastatin (MEVACOR) 20 MG tablet Take 1 tablet (20 mg total) by mouth daily. (Patient taking differently: Take 20 mg by mouth at bedtime. )  . Melatonin 10 MG TABS Take 10 mg by mouth at bedtime as needed (sleep).  . metolazone (ZAROXOLYN) 2.5 MG tablet Take 2.5 mg by mouth every other day.  . Omega-3 Fatty Acids (FISH OIL) 1000 MG CAPS Take 1,000 mg by mouth at bedtime.   . ondansetron (ZOFRAN ODT) 4 MG disintegrating tablet Take 1 tablet (4 mg total) by mouth every 8 (eight) hours as needed for nausea or vomiting.  Marland Kitchen oxybutynin (DITROPAN) 5 MG tablet Take 5 mg by mouth 3 (three) times daily.  Marland Kitchen oxyCODONE-acetaminophen (PERCOCET/ROXICET) 5-325 MG tablet Take 1 tablet by mouth every 6 (six) hours as needed for severe pain.  . SUMAtriptan (IMITREX) 50 MG tablet Take 1 tablet (50 mg total) by mouth every 2 (two) hours as needed for migraine. May repeat in 2 hours if headache persists or recurs.  . tamsulosin (FLOMAX) 0.4 MG CAPS capsule Take 0.4 mg by mouth at bedtime.   . TRESIBA FLEXTOUCH 200 UNIT/ML FlexTouch Pen INJECT 70 UNITS SUBCUTANEOUSLY ONCE DAILY (Patient taking differently: Inject 100 Units into the  skin daily. )  . trimethoprim (TRIMPEX) 100 MG tablet Take 100 mg by mouth at bedtime.  Marland Kitchen VICTOZA 18 MG/3ML SOPN INJECT 1.2 MG (0.2 MLS) INTO THE SKIN ONCE DAILY AT THE SAME TIME. (Patient taking differently: Inject 1.2 mg into the skin daily. )  . zinc sulfate 220 (50 Zn) MG capsule Take 220 mg by mouth daily.     Allergies:   Reglan [metoclopramide]   Social History   Socioeconomic History  . Marital status: Single    Spouse name: Not on file  . Number of children: 0  . Years of education: 15.5  . Highest education level: Not on file  Occupational History  . Occupation: [employer]  . Occupation: Lexicographer: [employer]   Tobacco Use  . Smoking status: Never Smoker  . Smokeless tobacco: Never Used  Substance and Sexual Activity  . Alcohol use: Yes    Comment: 10/31/2016 "a few beers q 6 months or so"  . Drug use: No  . Sexual activity: Yes  Other Topics Concern  . Not on file  Social History Narrative   Financial assistance approved for 100% discount at [facility] and has [facility] card per [employer]   09/01/2009.      Lives in [place] with mother and sister.            Social Determinants of Health   Financial Resource Strain:   . Difficulty of Paying Living Expenses:   Food Insecurity:   . Worried About Charity fundraiser in the Last Year:   . Arboriculturist in the Last Year:   Transportation Needs:   . Film/video editor (Medical):   Marland Kitchen Lack of Transportation (Non-Medical):   Physical Activity:   . Days of Exercise per Week:   . Minutes of Exercise per Session:   Stress:   . Feeling of Stress :   Social Connections:   . Frequency of Communication with Friends and Family:   . Frequency of Social Gatherings with Friends and Family:   . Attends Religious Services:   . Active Member of Clubs or Organizations:   .  Attends Archivist Meetings:   Marland Kitchen Marital Status:      Family History: The patient's family history includes Diabetes in his  cousin, maternal uncle, and mother; Heart disease in his mother.  ROS:   Please see the history of present illness.    ROS  All other systems reviewed and negative.   EKGs/Labs/Other Studies Reviewed:    The following studies were reviewed today: none  EKG:  EKG is not ordered today.   Recent Labs: 09/03/2018: BUN 65; Creatinine, Ser 2.55; Platelets 281; Potassium 3.9; Sodium 139 04/17/2019: Hemoglobin 9.8   Recent Lipid Panel    Component Value Date/Time   CHOL 122 11/14/2018 1547   TRIG 135 11/14/2018 1547   HDL 34 (L) 11/14/2018 1547   CHOLHDL 3.6 11/14/2018 1547   CHOLHDL 4 06/05/2017 0840   VLDL 25.4 06/05/2017 0840   LDLCALC 64 11/14/2018 1547    Physical Exam:    VS:  BP (!) 144/82   Pulse 81   Ht 6' 2" (1.88 m)   Wt (!) 360 lb (163.3 kg) Comment: Pt stated his weight  SpO2 95%   BMI 46.22 kg/m     Wt Readings from Last 3 Encounters:  05/08/19 (!) 360 lb (163.3 kg)  04/18/19 (!) 376 lb 6.4 oz (170.7 kg)  11/14/18 (!) 370 lb (167.8 kg)     GEN:  Well nourished, well developed in no acute distress HEENT: Normal NECK: No JVD; No carotid bruits LYMPHATICS: No lymphadenopathy CARDIAC: RRR, no murmurs, rubs, gallops RESPIRATORY:  Clear to auscultation without rales, wheezing or rhonchi  ABDOMEN: Soft, non-tender, non-distended MUSCULOSKELETAL:  No edema; No deformity  SKIN: Warm and dry NEUROLOGIC:  Alert and oriented x 3 PSYCHIATRIC:  Normal affect   ASSESSMENT:    1. OSA (obstructive sleep apnea)   2. Chronic diastolic heart failure ([place])   3. Morbid obesity ([place])   4. Chronic renal insufficiency, stage 3 (moderate)   5. Essential hypertension   6. Drug-induced erectile dysfunction    PLAN:    In order of problems listed above:  1.  OSA -  The patient was noncompliant with his device and had to turn it back in.   -we discussed the issues with untreated OSA including worsening DM, CHF, pulmonary HTN and worsening LE edema -I have recommended  that we start him back on PAP therapy -I will reorder a CPAP at 16cm H2O and see him back in 8 weeks  2.  Chronic diastolic CHF -he denies any SOB  -he has chronic LLE edema which is stable -weight is stable -Continue Lasix 57m BID -I will repeat a 2D echo since he is going to have weight loss surgery to make sure that LVF is still normal -he is able to do at least 4.5 mets of activity even with his leg prosthesis without any anginal sx so his revised cardiac risk index for weight loss surgery is high at 11% for periop major cardiac event but not prohibitive and due mainly to morbid obesity, DM, CHF and CKD.   3.  Morbid Obesity -he is moving forward with weight loss surgery and is working out at the gym  4.  CKD stage 3 -baseline creatinine is 2.1-2.2 -Creatinine was as high as 3.51 in April butwas 3.07 in Dec 2020 -followed by PCP  5.  HTN -BP is controlled on exam today -continue Carvedilol 224mBID, Hydralazine 5069mID, Clonidine 0.3mg79mD  6.  Erectile Dysfunction -he is having  problems with erectile dysfunction which could be coming from the Carvedilol -his testosterone level was low a few years ago -He is seeing his endocrinologist tomorrow and I have recommended that he check sex hormone levels -I would like to hold off on changing BB until we know if testosterone level is low -if hormone levels are normal then will try changing Carvedilol to Bystolic and see if things improve   Medication Adjustments/Labs and Tests Ordered: Current medicines are reviewed at length with the patient today.  Concerns regarding medicines are outlined above.  Orders Placed This Encounter  Procedures  . ECHOCARDIOGRAM COMPLETE   No orders of the defined types were placed in this encounter.   Signed, Fransico Him, MD  05/08/2019 9:23 AM    [place]

## 2019-05-08 NOTE — Telephone Encounter (Signed)
Patient will have Dr. Amie [place] office give [place] a call for surgery clearance

## 2019-05-08 NOTE — Telephone Encounter (Signed)
Christy with [facility] called, she wanted to see if this patient was cleared for surgery. She read Dr. Theodosia Blender note from today about patient being non compliant. Please call Christy back at [phone]. She saw that Dr. Radford Pax ordered an ECHO, will he be cleared after ECHO?

## 2019-05-08 NOTE — Patient Instructions (Signed)
Medication Instructions:  Your physician recommends that you continue on your current medications as directed. Please refer to the Current Medication list given to you today.  *If you need a refill on your cardiac medications before your next appointment, please call your pharmacy*   Testing/Procedures: Your physician has requested that you have an echocardiogram. Echocardiography is a painless test that uses sound waves to create images of your heart. It provides your doctor with information about the size and shape of your heart and how well your heart's chambers and valves are working. This procedure takes approximately one hour. There are no restrictions for this procedure.   Follow-Up: At [facility], you and your health needs are our priority.  As part of our continuing mission to provide you with exceptional heart care, we have created designated Provider Care Teams.  These Care Teams include your primary Cardiologist (physician) and Advanced Practice Providers (APPs -  Physician Assistants and Nurse Practitioners) who all work together to provide you with the care you need, when you need it.  Follow up with Dr. Radford Pax after getting CPAP supplies.

## 2019-05-08 NOTE — Progress Notes (Signed)
PCP - Dr. Thomes Dinning Cardiologist - Dr. Ashok Norris  Chest x-ray - 10/10/18 EKG - 10/10/18 Stress Test - no ECHO - 02/04/18 Cardiac Cath - 2015  Sleep Study - yes CPAP - yes but pt hasn't been using it  Fasting Blood Sugar - 80-90 Checks Blood Sugar _____ times a day  Blood Thinner Instructions:eliquis Aspirin Instructions:Dr. Radford Pax said to stop 3 days prior to DOS Last Dose:4/30  Anesthesia review:   Patient denies shortness of breath, fever, cough and chest pain at PAT appointment  yes Patient verbalized understanding of instructions that were given to them at the PAT appointment. Patient was also instructed that they will need to review over the PAT instructions again at home before surgery. Yes, however he was extremely sleepy and was falling asleep every few minutes throughout the PAT visit. His CBG ,Vital Signs were WNL.   02 sats were 95% . The PA was called to talk with the Pt. I finished the interview and asked him to read all of his instructions after he naps at home.

## 2019-05-09 ENCOUNTER — Other Ambulatory Visit: Payer: Self-pay | Admitting: Endocrinology

## 2019-05-09 ENCOUNTER — Telehealth: Payer: Self-pay | Admitting: *Deleted

## 2019-05-09 ENCOUNTER — Other Ambulatory Visit: Payer: Medicare Other

## 2019-05-09 DIAGNOSIS — G4733 Obstructive sleep apnea (adult) (pediatric): Secondary | ICD-10-CM

## 2019-05-09 LAB — ABO/RH: ABO/RH(D): B POS

## 2019-05-09 NOTE — Telephone Encounter (Signed)
-----   Message from Sueanne Margarita, MD sent at 05/08/2019  9:41 AM EDT ----- Reorder CPAP at 16cm H2O with heated humidity and mask of choice with followup with me in 8-10 weeks.

## 2019-05-09 NOTE — Telephone Encounter (Signed)
Per Simeon Craft Echo scheduler there are no sooner appts available. However she will inform the pt that she will put him on the cancellation list incase something does come up sooner. I will send update notes to pre op provider. I will send FYI to [place] office Dr. Reece Agar.

## 2019-05-09 NOTE — Telephone Encounter (Signed)
I have sent a message to Simeon Craft. Echo scheduler to see if she may be able to move echo up sooner for pre op clearance per pre op provider. Echo has been ordered already per Dr. Radford Pax.

## 2019-05-09 NOTE — Telephone Encounter (Signed)
   Primary Cardiologist: Fransico Him, MD  Chart reviewed as part of pre-operative protocol coverage. Patient was seen by Dr. Radford Pax 05/08/19 for preoperative assessment and recommended to undergo an echocardiogram to evaluate LV function prior to granting clearance. If echo is reassuring, do not anticipate further cardiac work up and patient will be deemed acceptable risk for surgery.  Callback: - Can you attempt to move his echo appointment to an earlier date? Tentatively scheduled for 5/17 but would be great if we can get this done in the next 1-2 weeks.  - Please notify the requesting surgeons office of need for echo for further preop assessment.    [name] Butts, PA-C 05/09/2019, 8:43 AM

## 2019-05-09 NOTE — Telephone Encounter (Signed)
[facility] to [facility]  

## 2019-05-12 ENCOUNTER — Inpatient Hospital Stay (HOSPITAL_COMMUNITY): Admission: RE | Admit: 2019-05-12 | Payer: Medicare Other | Source: Home / Self Care | Admitting: General Surgery

## 2019-05-12 ENCOUNTER — Encounter (HOSPITAL_COMMUNITY): Admission: RE | Payer: Self-pay | Source: Home / Self Care

## 2019-05-12 ENCOUNTER — Ambulatory Visit: Payer: Medicare Other

## 2019-05-12 LAB — TYPE AND SCREEN
ABO/RH(D): B POS
Antibody Screen: NEGATIVE

## 2019-05-12 SURGERY — LAPAROSCOPIC ROUX-EN-Y GASTRIC BYPASS WITH UPPER ENDOSCOPY
Anesthesia: General

## 2019-05-13 ENCOUNTER — Ambulatory Visit: Payer: Medicare Other | Admitting: Endocrinology

## 2019-05-13 NOTE — Progress Notes (Deleted)
Patient ID: [name], male   DOB: 1975/08/19, 44 y.o.   MRN: [id number]          Reason for Appointment: Follow-up for Type 2 Diabetes   Today's office visit was provided via telemedicine using video technique Explained to the patient and the the limitations of evaluation and management by telemedicine and the availability of in person appointments.  The patient understood the limitations and agreed to proceed. Patient also understood that the telehealth visit is billable. . Location of the patient: Home . Location of the provider: Office Only the patient and myself were participating in the encounter   History of Present Illness:          Date of diagnosis of type 2 diabetes mellitus:  2001      His weight is about 400 pounds at the time of diagnosis He does not know what his initial blood sugars and circumstances were but he apparently was started on insulin initially He was not able to tolerate metformin because of diarrhea Subsequently has been on insulin only, level of control not available but his A1c was significantly high at 12.8 in 2015  Background history:    Recent history:   INSULIN regimen is:   100 units Tresiba, NovoLog 20-25-30 before meals  Non-insulin hypoglycemic drugs the patient is taking are: Victoza: 1.2 mg daily  His A1c was last 7.8 and last month was 6.9  Current management, blood sugar patterns and problems identified:  After his last visit his Tyler Aas was increased because of persistently high blood sugars in the morning  However his blood sugars have been as high as 325 recently  He thinks that he was having high sugars when he was not able to exercise because of ankle sprain  Most of the time he is checking blood sugars only in the mornings and sometimes late morning or around lunchtime but not after meals as directed  He still has not lost weight  Currently he has taken somewhat more insulin at suppertime for his mealtime coverage but  not adjusting it based on what he is eating  Also no hypoglycemia  He continues to take Victoza 1.2 mg and he thinks that he is not having any side effects with this.  Also not missing any doses  He is scheduled to see the dietitian in preparation for possible gastric bypass surgery  Despite cutting back on the exercise regimen in the last week or 2 he has still readings as high as 204 recently  Checking blood sugars intermittently   Side effects from medications have been: Diarrhea with metformin, vomiting with Ozempic  Compliance with the medical regimen: Improving   Glucose monitoring:  done at least 1 a day         Glucometer:   ACCU-CHEK [facility]  Blood sugar readings from patient meter related by patient with readings since 10/11/2018   PRE-MEAL Fasting Lunch Dinner Bedtime Overall  Glucose range:  161-222  104-278 ?    Mean/median:     ?   POST-MEAL PC Breakfast PC Lunch PC Dinner  Glucose range:  176-325  300   Mean/median:      Previous readings:   PRE-MEAL Fasting Lunch Dinner Bedtime Overall  Glucose range:  82-210  104-285 ?    Mean/median:         POST-MEAL PC Breakfast PC Lunch PC Dinner  Glucose range:    122-238  Mean/median:         Self-care:  Typical meal intake: Breakfast is egg whites, some oatmeal/grits and banana.  Usually small portions of meat or other protein at lunch and dinner and only a small amount of sweet potato at lunch or carbohydrate              Dietician visit, most recent: Unknown               Exercise:  Previously was doing 2 sessions of 1 hour each at the gym with weights, resistance and some cardio,, recently less frequent and less intensive    Weight history:  Wt Readings from Last 3 Encounters:  05/08/19 (!) 360 lb (163.3 kg)  05/08/19 (!) 360 lb (163.3 kg)  04/18/19 (!) 376 lb 6.4 oz (170.7 kg)    Glycemic control:   Lab Results  Component Value Date   HGBA1C 7.3 (A) 04/18/2019   HGBA1C 7.1 (A) 01/01/2019    HGBA1C 6.9 (A) 09/19/2018   Lab Results  Component Value Date   MICROALBUR 160.61 (H) 08/14/2013   LDLCALC 64 11/14/2018   CREATININE 4.07 (H) 05/08/2019   Lab Results  Component Value Date   MICRALBCREAT 1,528.4 (H) 02/15/2017    Lab Results  Component Value Date   FRUCTOSAMINE 263 12/04/2017      Allergies as of 05/13/2019      Reactions   Reglan [metoclopramide] Other (See Comments)   Dysphoric reaction      Medication List       Accurate as of May 13, 2019  2:58 PM. If you have any questions, ask your nurse or doctor.        Accu-Chek Guide Me w/Device Kit Use to check blood sugar up to 3 times a day   Accu-Chek Guide test strip Generic drug: glucose blood Check blood sugar up to 3 times a day   accu-chek softclix lancets Use to check blood sugars 5 times a day. Dx code:E11.9. Insulin dependent.   Accu-Chek Softclix Lancets lancets Check blood sugar up to 3 times a day   acidophilus Caps capsule Take 1 capsule by mouth daily.   allopurinol 100 MG tablet Commonly known as: ZYLOPRIM Take 2 tablets (200 mg total) by mouth daily.   Alpha-Lipoic Acid 600 MG Caps Take 600 mg by mouth daily.   Alpha-Lipoic Acid 200 MG Caps Take 1 capsule (200 mg total) by mouth daily.   amLODipine 10 MG tablet Commonly known as: NORVASC Take 10 mg by mouth at bedtime.   apixaban 5 MG Tabs tablet Commonly known as: Eliquis Take 1 tablet (5 mg total) by mouth 2 (two) times daily.   B-12 500 MCG Subl Place 500 mcg under the tongue daily.   calcitRIOL 0.25 MCG capsule Commonly known as: ROCALTROL Take 1 capsule by mouth once daily   carvedilol 25 MG tablet Commonly known as: COREG Take 1 tablet by mouth twice daily What changed: when to take this   cloNIDine 0.3 MG tablet Commonly known as: CATAPRES Take 1 tablet (0.3 mg total) by mouth 2 (two) times daily.   famotidine 20 MG tablet Commonly known as: PEPCID Take 1 tablet (20 mg total) by mouth 2 (two) times  daily. What changed:   when to take this  reasons to take this   Fish Oil 1000 MG Caps Take 1,000 mg by mouth at bedtime.   furosemide 80 MG tablet Commonly known as: LASIX Take 1 tablet by mouth twice daily What changed: when to take this   gabapentin 100 MG capsule  Commonly known as: NEURONTIN Take 1 capsule (100 mg total) by mouth 3 (three) times daily.   hydrALAZINE 50 MG tablet Commonly known as: APRESOLINE Take 1 tablet by mouth 4 times daily What changed: See the new instructions.   insulin aspart 100 UNIT/ML FlexPen Commonly known as: NOVOLOG Inject 25 Units into the skin 3 (three) times daily with meals. 20 unit morning, 25 units lunch 30 units for dinner What changed:   how much to take  when to take this  additional instructions   lovastatin 20 MG tablet Commonly known as: MEVACOR Take 1 tablet (20 mg total) by mouth daily. What changed: when to take this   Melatonin 10 MG Tabs Take 10 mg by mouth at bedtime as needed (sleep).   metolazone 2.5 MG tablet Commonly known as: ZAROXOLYN Take 2.5 mg by mouth every other day.   ondansetron 4 MG disintegrating tablet Commonly known as: Zofran ODT Take 1 tablet (4 mg total) by mouth every 8 (eight) hours as needed for nausea or vomiting.   oxybutynin 5 MG tablet Commonly known as: DITROPAN Take 5 mg by mouth 3 (three) times daily.   oxyCODONE-acetaminophen 5-325 MG tablet Commonly known as: PERCOCET/ROXICET Take 1 tablet by mouth every 6 (six) hours as needed for severe pain.   Pen Needles 3/16" 31G X 5 MM Misc Use five times daily   B-D UF III MINI PEN NEEDLES 31G X 5 MM Misc Generic drug: Insulin Pen Needle USE  4 TIMES DAILY   SUMAtriptan 50 MG tablet Commonly known as: IMITREX Take 1 tablet (50 mg total) by mouth every 2 (two) hours as needed for migraine. May repeat in 2 hours if headache persists or recurs.   tamsulosin 0.4 MG Caps capsule Commonly known as: FLOMAX Take 0.4 mg by mouth  at bedtime.   Tyler Aas FlexTouch 200 UNIT/ML FlexTouch Pen Generic drug: insulin degludec INJECT 70 UNITS SUBCUTANEOUSLY ONCE DAILY What changed: See the new instructions.   trimethoprim 100 MG tablet Commonly known as: TRIMPEX Take 100 mg by mouth at bedtime.   Victoza 18 MG/3ML Sopn Generic drug: liraglutide INJECT 1.2 MG (0.2 MLS) INTO THE SKIN ONCE DAILY AT THE SAME TIME. What changed: See the new instructions.   zinc sulfate 220 (50 Zn) MG capsule Take 220 mg by mouth daily.       Allergies:  Allergies  Allergen Reactions  . Reglan [Metoclopramide] Other (See Comments)    Dysphoric reaction    Past Medical History:  Diagnosis Date  . Acute venous embolism and thrombosis of deep vessels of proximal lower extremity ([place]) 07/19/2011  . Anemia   . Anginal pain ([place])   . Chest pain, neg MI, normal coronaries by cath 02/18/2013  . CKD (chronic kidney disease) stage 3, GFR 30-59 ml/min 02/19/2013  . Colon polyps   . Diabetic ulcer of right foot ([place])   . DVT (deep venous thrombosis) ([place]) 09/2002   patient reports additional DVTs in '06 & '11 (unconfirmed)  . GERD (gastroesophageal reflux disease)   . History of blood transfusion    "related to OR" (10/31/2016)  . Hyperlipidemia 02/19/2013  . Hypertension   . Nausea & vomiting    "constant for the last couple weeks" (10/31/2016)  . Nephrotic syndrome   . Obesity    BMI 44, weight 346 pounds 01/30/14  . OSA (obstructive sleep apnea) 02/28/2018   Mild obstructive sleep apnea with an AHI of 9.8/h but severe during REM sleep with an AHI of  33.8/h.  Oxygen saturations dropped to 86% and there was moderate snoring  . Prosthesis adjustment 08/17/2016  . Pulmonary embolism ([place]) 09/2002   treated with 6 months of warfarin  . Pyelonephritis 02/02/2018  . Type I diabetes mellitus ([place]) dx'd 2001    Past Surgical History:  Procedure Laterality Date  . AMPUTATION Right 12/22/2013   Procedure: AMPUTATION BELOW KNEE;  Surgeon: [name], MD;  Location: [facility];  Service: Orthopedics;  Laterality: Right;  . AMPUTATION Right 02/19/2014   Procedure: RIGHT ABOVE KNEE AMPUTATION ;  Surgeon: [name], MD;  Location: [facility];  Service: Orthopedics;  Laterality: Right;  . CARDIAC CATHETERIZATION  02/18/2013   normal coronaries  . ESOPHAGOGASTRODUODENOSCOPY N/A 11/02/2016   Procedure: ESOPHAGOGASTRODUODENOSCOPY (EGD);  Surgeon: [name], MD;  Location: [facility] ENDOSCOPY;  Service: Endoscopy;  Laterality: N/A;  . I & D EXTREMITY Right 12/14/2013   Procedure: IRRIGATION AND DEBRIDEMENT RIGHT FOOT;  Surgeon: [name], MD;  Location: [facility];  Service: Orthopedics;  Laterality: Right;  . INCISION AND DRAINAGE ABSCESS  2007; 2015   "back"  . INCISION AND DRAINAGE OF WOUND Right 12/22/2013   Procedure: I&D RIGHT BUTTOCK;  Surgeon: [name], MD;  Location: [facility];  Service: Orthopedics;  Laterality: Right;  . LEFT HEART CATHETERIZATION WITH CORONARY ANGIOGRAM N/A 02/18/2013   Procedure: LEFT HEART CATHETERIZATION WITH CORONARY ANGIOGRAM;  Surgeon: [name], MD;  Location: [facility] CATH LAB;  Service: Cardiovascular;  Laterality: N/A;    Family History  Problem Relation Age of Onset  . Heart disease Mother   . Diabetes Mother   . Diabetes Maternal Uncle   . Diabetes Cousin     Social History:  reports that he has never smoked. He has never used smokeless tobacco. He reports current alcohol use. He reports that he does not use drugs.   Review of Systems    Lipid history: Has been on lovastatin with control as follows: Prescribed by PCP   Lab Results  Component Value Date   CHOL 122 11/14/2018   HDL 34 (L) 11/14/2018   LDLCALC 64 11/14/2018   TRIG 135 11/14/2018   CHOLHDL 3.6 11/14/2018           Hypertension: He is taking clonidine, Norvasc and Coreg followed by nephrologist  BP Readings from Last 3 Encounters:  05/08/19 (!) 169/80  05/08/19 (!) 144/82  04/17/19 (!) 152/82     Most recent eye exam was in  08/2017  Most recent foot exam: 8/84   Complications of diabetes: Nephropathy with renal insufficiency and proteinuria, unknown status of retinopathy, erectile dysfunction  Followed by nephrologist for CKD Also has anemia  Lab Results  Component Value Date   CREATININE 4.07 (H) 05/08/2019   CREATININE 2.55 (H) 09/03/2018   CREATININE 3.51 (H) 05/07/2018     LABS:  Hospital Outpatient Visit on 05/08/2019  Component Date Value Ref Range Status  . Glucose-Capillary 05/08/2019 169* 70 - 99 mg/dL Final   Glucose reference range applies only to samples taken after fasting for at least 8 hours.  Hospital Outpatient Visit on 05/08/2019  Component Date Value Ref Range Status  . WBC 05/08/2019 9.5  4.0 - 10.5 K/uL Final  . RBC 05/08/2019 3.45* 4.22 - 5.81 MIL/uL Final  . Hemoglobin 05/08/2019 10.3* 13.0 - 17.0 g/dL Final  . HCT 05/08/2019 32.7* 39.0 - 52.0 % Final  . MCV 05/08/2019 94.8  80.0 - 100.0 fL Final  . MCH 05/08/2019 29.9  26.0 -  34.0 pg Final  . MCHC 05/08/2019 31.5  30.0 - 36.0 g/dL Final  . RDW 05/08/2019 14.8  11.5 - 15.5 % Final  . Platelets 05/08/2019 301  150 - 400 K/uL Final  . nRBC 05/08/2019 0.0  0.0 - 0.2 % Final  . Neutrophils Relative % 05/08/2019 64  % Final  . Neutro Abs 05/08/2019 6.1  1.7 - 7.7 K/uL Final  . Lymphocytes Relative 05/08/2019 22  % Final  . Lymphs Abs 05/08/2019 2.0  0.7 - 4.0 K/uL Final  . Monocytes Relative 05/08/2019 10  % Final  . Monocytes Absolute 05/08/2019 0.9  0.1 - 1.0 K/uL Final  . Eosinophils Relative 05/08/2019 4  % Final  . Eosinophils Absolute 05/08/2019 0.3  0.0 - 0.5 K/uL Final  . Basophils Relative 05/08/2019 0  % Final  . Basophils Absolute 05/08/2019 0.0  0.0 - 0.1 K/uL Final  . Immature Granulocytes 05/08/2019 0  % Final  . Abs Immature Granulocytes 05/08/2019 0.04  0.00 - 0.07 K/uL Final   Performed at [facility], [place] [address] [place], [place] [address]  . Sodium 05/08/2019 135  135 - 145  mmol/L Final  . Potassium 05/08/2019 4.0  3.5 - 5.1 mmol/L Final  . Chloride 05/08/2019 102  98 - 111 mmol/L Final  . CO2 05/08/2019 24  22 - 32 mmol/L Final  . Glucose, Bld 05/08/2019 183* 70 - 99 mg/dL Final   Glucose reference range applies only to samples taken after fasting for at least 8 hours.  . BUN 05/08/2019 77* 6 - 20 mg/dL Final  . Creatinine, Ser 05/08/2019 4.07* 0.61 - 1.24 mg/dL Final  . Calcium 05/08/2019 9.4  8.9 - 10.3 mg/dL Final  . Total Protein 05/08/2019 7.6  6.5 - 8.1 g/dL Final  . Albumin 05/08/2019 3.6  3.5 - 5.0 g/dL Final  . AST 05/08/2019 20  15 - 41 U/L Final  . ALT 05/08/2019 21  0 - 44 U/L Final  . Alkaline Phosphatase 05/08/2019 80  38 - 126 U/L Final  . Total Bilirubin 05/08/2019 0.3  0.3 - 1.2 mg/dL Final  . GFR calc non Af Amer 05/08/2019 17* >60 mL/min Final  . GFR calc Af Amer 05/08/2019 20* >60 mL/min Final  . Anion gap 05/08/2019 9  5 - 15 Final   Performed at [facility], [place] [address], [place], [place] [address]  . ABO/RH(D) 05/08/2019 B POS   Final  . Antibody Screen 05/08/2019 NEG   Final  . Sample Expiration 05/08/2019 05/15/2019,2359   Final  . Extend sample reason 05/08/2019    Final                   Value:NO TRANSFUSIONS OR PREGNANCY IN THE PAST 3 MONTHS Performed at [facility], [place] [address] [place], [place] [address]   . ABO/RH(D) 05/08/2019    Final                   Value:B POS Performed at [facility], [place] [address] [place], [place] [address]     Physical Examination:  There were no vitals taken for this visit.      ASSESSMENT:  Diabetes type 2, with morbid obesity  See history of present illness for detailed discussion of current diabetes management, blood sugar patterns and problems identified  His last A1c is more recently 6.9, previously 7.8  Blood sugars have been overall poorly controlled This is likely  to be from inconsistent diet, and activity  especially recently Even though he is trying to exercise again his fasting readings are persistently high Not clear what his postprandial readings are  Discussed his day-to-day problems with blood sugar control and how to improve Discussed blood sugar monitoring and blood sugar targets at various times  PLAN:   He will continue Victoza 1.2 mg daily No change in NovoLog unless his blood sugars are higher after dinner Discussed adjusting NovoLog based on meal size and carbohydrate Increase Tresiba up to 110 If he is getting relatively low readings after breakfast he can reduce his NovoLog to 15 in the morning Make sure he takes his NovoLog before starting to eat He will review his diet with the dietitian at the upcoming visit  Will review his management in about [redacted] weeks along with labs  There are no Patient Instructions on file for this visit.    Counseling time on subjects discussed in assessment and plan sections is over 50% of today's 25 minute visit  [name] 05/13/2019, 2:58 PM   Note: This office note was prepared with Dragon voice recognition system technology. Any transcriptional errors that result from this process are unintentional.

## 2019-05-14 ENCOUNTER — Other Ambulatory Visit (HOSPITAL_COMMUNITY): Payer: Self-pay | Admitting: *Deleted

## 2019-05-15 ENCOUNTER — Other Ambulatory Visit: Payer: Self-pay

## 2019-05-15 ENCOUNTER — Ambulatory Visit (HOSPITAL_COMMUNITY)
Admission: RE | Admit: 2019-05-15 | Discharge: 2019-05-15 | Disposition: A | Discharge status: Home / Self Care | Payer: Medicare Other | Source: Ambulatory Visit | Attending: Nephrology | Admitting: Nephrology

## 2019-05-15 VITALS — BP 162/91 | HR 82 | Temp 98.3°F | Resp 18

## 2019-05-15 DIAGNOSIS — N183 Chronic kidney disease, stage 3 unspecified: Secondary | ICD-10-CM

## 2019-05-15 LAB — POCT HEMOGLOBIN-HEMACUE: Hemoglobin: 9.8 g/dL — ABNORMAL LOW (ref 13.0–17.0)

## 2019-05-15 MED ORDER — DARBEPOETIN ALFA 100 MCG/0.5ML IJ SOSY
PREFILLED_SYRINGE | INTRAMUSCULAR | Status: AC
  Administered 2019-05-15: 100 ug via SUBCUTANEOUS
  Filled 2019-05-15: qty 0.5

## 2019-05-15 MED ORDER — DARBEPOETIN ALFA 100 MCG/0.5ML IJ SOSY: 100.0000 ug | PREFILLED_SYRINGE | INTRAMUSCULAR | Status: DC

## 2019-05-15 MED ORDER — SODIUM CHLORIDE 0.9 % IV SOLN
510.0000 mg | Freq: Once | INTRAVENOUS | Status: AC
  Administered 2019-05-15: 510 mg via INTRAVENOUS
  Filled 2019-05-15: qty 17

## 2019-05-15 NOTE — Telephone Encounter (Signed)
Patient calls asking for glucometer be brought to him in short stay. Sample Accu chek [place] given to patient

## 2019-05-18 ENCOUNTER — Other Ambulatory Visit: Payer: Self-pay | Admitting: Internal Medicine

## 2019-05-20 NOTE — Telephone Encounter (Addendum)
   Primary Cardiologist: Fransico Him, MD  Chart reviewed as part of pre-operative protocol coverage. Chart revisited as part of pre-op coverage. To recap, patient was seen by Dr. Radford Pax 05/08/19 for pre-operative clearance. She recommended 2D echocardiogram for further decision making. Per office protocol, since patient was already seen by provider for clearance, the provider should assess clearance at time of final testing and should forward their finalized clearance decision to requesting party below. There are no separate decision-making needs identified at this time from the APP team. I will route to Dr. Radford Pax and her nurse to be aware to route final decision when workup complete. I will remove this message from the pre-op box.   Charlie Pitter, PA-C 05/20/2019, 3:13 PM

## 2019-05-21 ENCOUNTER — Inpatient Hospital Stay (HOSPITAL_COMMUNITY)
Admission: EM | Admit: 2019-05-21 | Discharge: 2019-05-25 | DRG: 193 | Disposition: A | Discharge status: Home / Self Care | Payer: Medicare Other | Attending: Internal Medicine | Admitting: Internal Medicine

## 2019-05-21 ENCOUNTER — Encounter (HOSPITAL_COMMUNITY): Payer: Self-pay | Admitting: Emergency Medicine

## 2019-05-21 ENCOUNTER — Other Ambulatory Visit: Payer: Self-pay

## 2019-05-21 ENCOUNTER — Emergency Department (HOSPITAL_COMMUNITY): Payer: Medicare Other

## 2019-05-21 ENCOUNTER — Telehealth: Payer: Self-pay

## 2019-05-21 DIAGNOSIS — K219 Gastro-esophageal reflux disease without esophagitis: Secondary | ICD-10-CM | POA: Diagnosis present

## 2019-05-21 DIAGNOSIS — N1832 Chronic kidney disease, stage 3b: Secondary | ICD-10-CM | POA: Diagnosis present

## 2019-05-21 DIAGNOSIS — Z7901 Long term (current) use of anticoagulants: Secondary | ICD-10-CM

## 2019-05-21 DIAGNOSIS — E10649 Type 1 diabetes mellitus with hypoglycemia without coma: Secondary | ICD-10-CM | POA: Diagnosis not present

## 2019-05-21 DIAGNOSIS — Z833 Family history of diabetes mellitus: Secondary | ICD-10-CM

## 2019-05-21 DIAGNOSIS — J189 Pneumonia, unspecified organism: Secondary | ICD-10-CM | POA: Diagnosis not present

## 2019-05-21 DIAGNOSIS — R05 Cough: Secondary | ICD-10-CM | POA: Diagnosis not present

## 2019-05-21 DIAGNOSIS — I10 Essential (primary) hypertension: Secondary | ICD-10-CM | POA: Diagnosis not present

## 2019-05-21 DIAGNOSIS — Z20822 Contact with and (suspected) exposure to covid-19: Secondary | ICD-10-CM | POA: Diagnosis present

## 2019-05-21 DIAGNOSIS — I130 Hypertensive heart and chronic kidney disease with heart failure and stage 1 through stage 4 chronic kidney disease, or unspecified chronic kidney disease: Secondary | ICD-10-CM | POA: Diagnosis not present

## 2019-05-21 DIAGNOSIS — Z6841 Body Mass Index (BMI) 40.0 and over, adult: Secondary | ICD-10-CM

## 2019-05-21 DIAGNOSIS — Z86718 Personal history of other venous thrombosis and embolism: Secondary | ICD-10-CM

## 2019-05-21 DIAGNOSIS — I5033 Acute on chronic diastolic (congestive) heart failure: Secondary | ICD-10-CM | POA: Diagnosis present

## 2019-05-21 DIAGNOSIS — Z86711 Personal history of pulmonary embolism: Secondary | ICD-10-CM

## 2019-05-21 DIAGNOSIS — Z79899 Other long term (current) drug therapy: Secondary | ICD-10-CM

## 2019-05-21 DIAGNOSIS — E1022 Type 1 diabetes mellitus with diabetic chronic kidney disease: Secondary | ICD-10-CM | POA: Diagnosis present

## 2019-05-21 DIAGNOSIS — E785 Hyperlipidemia, unspecified: Secondary | ICD-10-CM | POA: Diagnosis present

## 2019-05-21 DIAGNOSIS — Z888 Allergy status to other drugs, medicaments and biological substances status: Secondary | ICD-10-CM

## 2019-05-21 DIAGNOSIS — Z8249 Family history of ischemic heart disease and other diseases of the circulatory system: Secondary | ICD-10-CM

## 2019-05-21 DIAGNOSIS — Z794 Long term (current) use of insulin: Secondary | ICD-10-CM

## 2019-05-21 DIAGNOSIS — G4733 Obstructive sleep apnea (adult) (pediatric): Secondary | ICD-10-CM | POA: Diagnosis present

## 2019-05-21 DIAGNOSIS — J9601 Acute respiratory failure with hypoxia: Secondary | ICD-10-CM | POA: Diagnosis not present

## 2019-05-21 DIAGNOSIS — R0602 Shortness of breath: Secondary | ICD-10-CM | POA: Diagnosis not present

## 2019-05-21 DIAGNOSIS — Z89611 Acquired absence of right leg above knee: Secondary | ICD-10-CM

## 2019-05-21 DIAGNOSIS — I5032 Chronic diastolic (congestive) heart failure: Secondary | ICD-10-CM | POA: Diagnosis present

## 2019-05-21 DIAGNOSIS — D631 Anemia in chronic kidney disease: Secondary | ICD-10-CM | POA: Diagnosis present

## 2019-05-21 DIAGNOSIS — Z7289 Other problems related to lifestyle: Secondary | ICD-10-CM

## 2019-05-21 LAB — COMPREHENSIVE METABOLIC PANEL
ALT: 23 U/L (ref 0–44)
AST: 19 U/L (ref 15–41)
Albumin: 3 g/dL — ABNORMAL LOW (ref 3.5–5.0)
Alkaline Phosphatase: 99 U/L (ref 38–126)
Anion gap: 11 (ref 5–15)
BUN: 72 mg/dL — ABNORMAL HIGH (ref 6–20)
CO2: 22 mmol/L (ref 22–32)
Calcium: 9.1 mg/dL (ref 8.9–10.3)
Chloride: 103 mmol/L (ref 98–111)
Creatinine, Ser: 3.82 mg/dL — ABNORMAL HIGH (ref 0.61–1.24)
GFR calc Af Amer: 21 mL/min — ABNORMAL LOW (ref >60)
GFR calc non Af Amer: 18 mL/min — ABNORMAL LOW (ref >60)
Glucose, Bld: 131 mg/dL — ABNORMAL HIGH (ref 70–99)
Potassium: 4.4 mmol/L (ref 3.5–5.1)
Sodium: 136 mmol/L (ref 135–145)
Total Bilirubin: 0.8 mg/dL (ref 0.3–1.2)
Total Protein: 7.3 g/dL (ref 6.5–8.1)

## 2019-05-21 LAB — SARS CORONAVIRUS 2 BY RT PCR (HOSPITAL ORDER, PERFORMED IN ~~LOC~~ HOSPITAL LAB): SARS Coronavirus 2: NEGATIVE

## 2019-05-21 LAB — GLUCOSE, CAPILLARY
Glucose-Capillary: 115 mg/dL — ABNORMAL HIGH (ref 70–99)
Glucose-Capillary: 233 mg/dL — ABNORMAL HIGH (ref 70–99)
Glucose-Capillary: 196 mg/dL — ABNORMAL HIGH (ref 70–99)

## 2019-05-21 LAB — CBC WITH DIFFERENTIAL/PLATELET
Abs Immature Granulocytes: 0.08 10*3/uL — ABNORMAL HIGH (ref 0.00–0.07)
Basophils Absolute: 0 10*3/uL (ref 0.0–0.1)
Basophils Relative: 0 %
Eosinophils Absolute: 0.2 10*3/uL (ref 0.0–0.5)
Eosinophils Relative: 1 %
HCT: 31.3 % — ABNORMAL LOW (ref 39.0–52.0)
Hemoglobin: 9.6 g/dL — ABNORMAL LOW (ref 13.0–17.0)
Immature Granulocytes: 1 %
Lymphocytes Relative: 9 %
Lymphs Abs: 1.2 10*3/uL (ref 0.7–4.0)
MCH: 28.5 pg (ref 26.0–34.0)
MCHC: 30.7 g/dL (ref 30.0–36.0)
MCV: 92.9 fL (ref 80.0–100.0)
Monocytes Absolute: 1 10*3/uL (ref 0.1–1.0)
Monocytes Relative: 8 %
Neutro Abs: 10.7 10*3/uL — ABNORMAL HIGH (ref 1.7–7.7)
Neutrophils Relative %: 81 %
Platelets: 362 10*3/uL (ref 150–400)
RBC: 3.37 MIL/uL — ABNORMAL LOW (ref 4.22–5.81)
RDW: 15.1 % (ref 11.5–15.5)
WBC: 13.2 10*3/uL — ABNORMAL HIGH (ref 4.0–10.5)
nRBC: 0 % (ref 0.0–0.2)

## 2019-05-21 LAB — TROPONIN I (HIGH SENSITIVITY)
Troponin I (High Sensitivity): 25 ng/L — ABNORMAL HIGH (ref <18)
Troponin I (High Sensitivity): 26 ng/L — ABNORMAL HIGH (ref <18)

## 2019-05-21 LAB — BRAIN NATRIURETIC PEPTIDE: B Natriuretic Peptide: 313.2 pg/mL — ABNORMAL HIGH (ref 0.0–100.0)

## 2019-05-21 LAB — MRSA PCR SCREENING: MRSA by PCR: NEGATIVE

## 2019-05-21 LAB — LACTIC ACID, PLASMA
Lactic Acid, Venous: 0.5 mmol/L (ref 0.5–1.9)
Lactic Acid, Venous: 0.6 mmol/L (ref 0.5–1.9)

## 2019-05-21 LAB — HIV ANTIBODY (ROUTINE TESTING W REFLEX): HIV Screen 4th Generation wRfx: NONREACTIVE

## 2019-05-21 MED ORDER — AZITHROMYCIN 500 MG PO TABS
500.0000 mg | ORAL_TABLET | Freq: Every day | ORAL | Status: AC
  Administered 2019-05-22 – 2019-05-23 (×2): 500 mg via ORAL
  Filled 2019-05-21 (×2): qty 1

## 2019-05-21 MED ORDER — ACETAMINOPHEN 650 MG RE SUPP
650.0000 mg | Freq: Four times a day (QID) | RECTAL | Status: DC | PRN
  Filled 2019-05-21: qty 1

## 2019-05-21 MED ORDER — POLYETHYLENE GLYCOL 3350 17 G PO PACK: 17.0000 g | PACK | Freq: Every day | ORAL | Status: DC | PRN

## 2019-05-21 MED ORDER — ONDANSETRON HCL 4 MG/2ML IJ SOLN
4.0000 mg | Freq: Four times a day (QID) | INTRAMUSCULAR | Status: DC | PRN
  Administered 2019-05-21: 4 mg via INTRAVENOUS
  Filled 2019-05-21: qty 2

## 2019-05-21 MED ORDER — ALLOPURINOL 100 MG PO TABS
200.0000 mg | ORAL_TABLET | Freq: Every day | ORAL | Status: DC
  Administered 2019-05-21 – 2019-05-25 (×5): 200 mg via ORAL
  Filled 2019-05-21 (×6): qty 2

## 2019-05-21 MED ORDER — SODIUM CHLORIDE 0.9 % IV SOLN
500.0000 mg | Freq: Once | INTRAVENOUS | Status: AC
  Administered 2019-05-21: 500 mg via INTRAVENOUS
  Filled 2019-05-21: qty 500

## 2019-05-21 MED ORDER — OXYBUTYNIN CHLORIDE 5 MG PO TABS
5.0000 mg | ORAL_TABLET | Freq: Three times a day (TID) | ORAL | Status: DC
  Administered 2019-05-21 – 2019-05-25 (×11): 5 mg via ORAL
  Filled 2019-05-21 (×12): qty 1

## 2019-05-21 MED ORDER — AMLODIPINE BESYLATE 10 MG PO TABS
10.0000 mg | ORAL_TABLET | Freq: Every day | ORAL | Status: DC
  Administered 2019-05-21 – 2019-05-24 (×4): 10 mg via ORAL
  Filled 2019-05-21 (×4): qty 1

## 2019-05-21 MED ORDER — INSULIN ASPART 100 UNIT/ML ~~LOC~~ SOLN
0.0000 [IU] | Freq: Three times a day (TID) | SUBCUTANEOUS | Status: DC
  Administered 2019-05-22 – 2019-05-23 (×3): 3 [IU] via SUBCUTANEOUS
  Administered 2019-05-24: 4 [IU] via SUBCUTANEOUS
  Administered 2019-05-24: 3 [IU] via SUBCUTANEOUS

## 2019-05-21 MED ORDER — ONDANSETRON HCL 4 MG PO TABS: 4.0000 mg | ORAL_TABLET | Freq: Four times a day (QID) | ORAL | Status: DC | PRN

## 2019-05-21 MED ORDER — GABAPENTIN 100 MG PO CAPS
100.0000 mg | ORAL_CAPSULE | Freq: Three times a day (TID) | ORAL | Status: DC
  Administered 2019-05-21 – 2019-05-25 (×12): 100 mg via ORAL
  Filled 2019-05-21 (×12): qty 1

## 2019-05-21 MED ORDER — INSULIN ASPART 100 UNIT/ML ~~LOC~~ SOLN
10.0000 [IU] | Freq: Three times a day (TID) | SUBCUTANEOUS | Status: DC
  Administered 2019-05-21 – 2019-05-22 (×4): 10 [IU] via SUBCUTANEOUS

## 2019-05-21 MED ORDER — CARVEDILOL 25 MG PO TABS
25.0000 mg | ORAL_TABLET | Freq: Two times a day (BID) | ORAL | Status: DC
  Administered 2019-05-21 – 2019-05-25 (×8): 25 mg via ORAL
  Filled 2019-05-21 (×8): qty 1

## 2019-05-21 MED ORDER — ALPHA-LIPOIC ACID 600 MG PO CAPS: 600.0000 mg | ORAL_CAPSULE | Freq: Every day | ORAL | Status: DC

## 2019-05-21 MED ORDER — TAMSULOSIN HCL 0.4 MG PO CAPS
0.4000 mg | ORAL_CAPSULE | Freq: Every day | ORAL | Status: DC
  Administered 2019-05-21 – 2019-05-24 (×4): 0.4 mg via ORAL
  Filled 2019-05-21 (×4): qty 1

## 2019-05-21 MED ORDER — FUROSEMIDE 10 MG/ML IJ SOLN
80.0000 mg | Freq: Once | INTRAMUSCULAR | Status: AC
  Administered 2019-05-21: 80 mg via INTRAVENOUS
  Filled 2019-05-21: qty 8

## 2019-05-21 MED ORDER — ACETAMINOPHEN 325 MG PO TABS
650.0000 mg | ORAL_TABLET | Freq: Four times a day (QID) | ORAL | Status: DC | PRN
  Administered 2019-05-22 – 2019-05-24 (×4): 650 mg via ORAL
  Filled 2019-05-21 (×4): qty 2

## 2019-05-21 MED ORDER — HYDRALAZINE HCL 50 MG PO TABS
50.0000 mg | ORAL_TABLET | Freq: Four times a day (QID) | ORAL | Status: DC
  Administered 2019-05-21 – 2019-05-25 (×13): 50 mg via ORAL
  Filled 2019-05-21 (×14): qty 1

## 2019-05-21 MED ORDER — CYANOCOBALAMIN 500 MCG PO TABS
500.0000 ug | ORAL_TABLET | Freq: Every day | ORAL | Status: DC
  Administered 2019-05-22 – 2019-05-25 (×4): 500 ug via ORAL
  Filled 2019-05-21: qty 1
  Filled 2019-05-21: qty 5
  Filled 2019-05-21 (×5): qty 1

## 2019-05-21 MED ORDER — CALCITRIOL 0.25 MCG PO CAPS
0.5000 ug | ORAL_CAPSULE | Freq: Every day | ORAL | Status: DC
  Administered 2019-05-21 – 2019-05-25 (×5): 0.5 ug via ORAL
  Filled 2019-05-21: qty 2
  Filled 2019-05-21: qty 1
  Filled 2019-05-21 (×4): qty 2

## 2019-05-21 MED ORDER — PRAVASTATIN SODIUM 10 MG PO TABS
10.0000 mg | ORAL_TABLET | Freq: Every day | ORAL | Status: DC
  Administered 2019-05-21 – 2019-05-24 (×4): 10 mg via ORAL
  Filled 2019-05-21 (×4): qty 1

## 2019-05-21 MED ORDER — SODIUM CHLORIDE 0.9 % IV SOLN
1.0000 g | Freq: Once | INTRAVENOUS | Status: AC
  Administered 2019-05-21: 1 g via INTRAVENOUS
  Filled 2019-05-21: qty 10

## 2019-05-21 MED ORDER — INSULIN GLARGINE 100 UNIT/ML ~~LOC~~ SOLN
50.0000 [IU] | Freq: Every day | SUBCUTANEOUS | Status: DC
  Administered 2019-05-21 – 2019-05-23 (×3): 50 [IU] via SUBCUTANEOUS
  Filled 2019-05-21 (×7): qty 0.5

## 2019-05-21 MED ORDER — RISAQUAD PO CAPS
1.0000 | ORAL_CAPSULE | Freq: Every day | ORAL | Status: DC
  Administered 2019-05-22 – 2019-05-25 (×4): 1 via ORAL
  Filled 2019-05-21 (×6): qty 1

## 2019-05-21 MED ORDER — INSULIN ASPART 100 UNIT/ML ~~LOC~~ SOLN
0.0000 [IU] | Freq: Every day | SUBCUTANEOUS | Status: DC
  Administered 2019-05-21: 2 [IU] via SUBCUTANEOUS

## 2019-05-21 MED ORDER — SODIUM CHLORIDE 0.9 % IV SOLN
1.0000 g | INTRAVENOUS | Status: DC
  Administered 2019-05-21: 1 g via INTRAVENOUS
  Filled 2019-05-21: qty 10

## 2019-05-21 MED ORDER — APIXABAN 5 MG PO TABS
5.0000 mg | ORAL_TABLET | Freq: Two times a day (BID) | ORAL | Status: DC
  Administered 2019-05-21 – 2019-05-25 (×8): 5 mg via ORAL
  Filled 2019-05-21 (×11): qty 1

## 2019-05-21 MED ORDER — ONDANSETRON 4 MG PO TBDP
4.0000 mg | ORAL_TABLET | Freq: Once | ORAL | Status: AC
  Administered 2019-05-21: 4 mg via ORAL
  Filled 2019-05-21: qty 1

## 2019-05-21 MED ORDER — CLONIDINE HCL 0.1 MG PO TABS
0.3000 mg | ORAL_TABLET | Freq: Two times a day (BID) | ORAL | Status: DC
  Administered 2019-05-21 – 2019-05-25 (×8): 0.3 mg via ORAL
  Filled 2019-05-21 (×8): qty 3

## 2019-05-21 MED ORDER — MELATONIN 5 MG PO TABS
10.0000 mg | ORAL_TABLET | Freq: Every evening | ORAL | Status: DC | PRN
  Administered 2019-05-22 (×2): 10 mg via ORAL
  Filled 2019-05-21 (×3): qty 2

## 2019-05-21 NOTE — Plan of Care (Signed)
  Problem: Clinical Measurements: Goal: Ability to maintain clinical measurements within normal limits will improve Outcome: Progressing Goal: Cardiovascular complication will be avoided Outcome: Progressing   Problem: Nutrition: Goal: Adequate nutrition will be maintained Outcome: Progressing

## 2019-05-21 NOTE — Telephone Encounter (Signed)
Called pt back: Swelling of legs Decreased voiding Cough Short of breath H/a Advised ED now

## 2019-05-21 NOTE — ED Notes (Signed)
Lunch Tray Ordered @ 1991.

## 2019-05-21 NOTE — Telephone Encounter (Signed)
Agreed, may need admission

## 2019-05-21 NOTE — Plan of Care (Signed)

## 2019-05-21 NOTE — ED Provider Notes (Addendum)
[facility] EMERGENCY DEPARTMENT Provider Note   CSN: [id number] Arrival date & time: 05/21/19  1132     History Chief Complaint  Patient presents with  . Cough  . Leg Swelling  . Shortness of Breath    [name] is a 44 y.o. male.  HPI  Patient is a 44 year old male with a past medical history of DVT/PE on Eliquis, anemia requiring iron transfusion, CKD three, status post right AKA, HLD, HTN, nephrotic syndrome, obesity, OSA DM 1  Patient states that following the past 3 weeks.  He states that since Monday he has noticed that he has had a productive cough of yellow phlegm, nausea and shortness of breath since Monday.  He states that he is normally able to go to the gym several times a week and has does not have the energy this week.  Patient denies any chest pain but states that he is increasingly short of breath for the past week.  He endorses nausea and vomiting.  He states that his emesis has been nonbloody nonbilious.  He states he has been able to tolerate some food and water without difficulty.  Patient has any fevers or chills.  States he does feel fatigued but no lightheadedness or dizziness.  Patient is on 80 mg Lasix daily.  Did not take his medication this morning.  States that he has been making less urine over the last few days as well.  He states he has been eating and drinking less.  States his last bowel movement was on Monday.     Past Medical History:  Diagnosis Date  . Acute venous embolism and thrombosis of deep vessels of proximal lower extremity ([place]) 07/19/2011  . Anemia   . Anginal pain ([place])   . Chest pain, neg MI, normal coronaries by cath 02/18/2013  . CKD (chronic kidney disease) stage 3, GFR 30-59 ml/min 02/19/2013  . Colon polyps   . Diabetic ulcer of right foot ([place])   . DVT (deep venous thrombosis) ([place]) 09/2002   patient reports additional DVTs in '06 & '11 (unconfirmed)  . GERD (gastroesophageal reflux disease)   .  History of blood transfusion    "related to OR" (10/31/2016)  . Hyperlipidemia 02/19/2013  . Hypertension   . Nausea & vomiting    "constant for the last couple weeks" (10/31/2016)  . Nephrotic syndrome   . Obesity    BMI 44, weight 346 pounds 01/30/14  . OSA (obstructive sleep apnea) 02/28/2018   Mild obstructive sleep apnea with an AHI of 9.8/h but severe during REM sleep with an AHI of 33.8/h.  Oxygen saturations dropped to 86% and there was moderate snoring  . Prosthesis adjustment 08/17/2016  . Pulmonary embolism ([place]) 09/2002   treated with 6 months of warfarin  . Pyelonephritis 02/02/2018  . Type I diabetes mellitus ([place]) dx'd 2001    Patient Active Problem List   Diagnosis Date Noted  . Acute pain of left knee 01/01/2019  . Hemoglobinuria 12/12/2018  . Hematoma of left lower leg 12/12/2018  . Urinary tract infection 09/04/2018  . Exposure to SARS-associated coronavirus 07/25/2018  . Headache 06/18/2018  . Leg swelling   . Cellulitis 04/12/2018  . Pain of left lower extremity 03/20/2018  . OSA (obstructive sleep apnea) 02/28/2018  . Chronic diastolic heart failure ([place]) 02/02/2018  . Healthcare maintenance 08/15/2017  . Erectile dysfunction 04/22/2017  . History of pulmonary embolus (PE) 06/09/2016  . Unilateral AKA, right ([place]) 12/26/2013  . Hypoglycemic  event in diabetes ([name]) 12/17/2013  . Hyperlipidemia 02/19/2013  . S/P cardiac cath, 02/18/13, normal coronaries 02/19/2013  . Chronic renal insufficiency, stage 3 (moderate) 02/19/2013  . Hypertension 07/24/2011  . Long term current use of anticoagulant therapy 07/19/2011  . History of DVT (deep vein thrombosis) 07/28/2009  . Morbid obesity ([place]) 10/26/2005  . Insulin dependent type 2 diabetes mellitus, controlled ([place]) 01/09/2002    Past Surgical History:  Procedure Laterality Date  . AMPUTATION Right 12/22/2013   Procedure: AMPUTATION BELOW KNEE;  Surgeon: [name], MD;  Location: [facility];  Service: Orthopedics;   Laterality: Right;  . AMPUTATION Right 02/19/2014   Procedure: RIGHT ABOVE KNEE AMPUTATION ;  Surgeon: [name], MD;  Location: [facility];  Service: Orthopedics;  Laterality: Right;  . CARDIAC CATHETERIZATION  02/18/2013   normal coronaries  . ESOPHAGOGASTRODUODENOSCOPY N/A 11/02/2016   Procedure: ESOPHAGOGASTRODUODENOSCOPY (EGD);  Surgeon: [name], MD;  Location: [facility] ENDOSCOPY;  Service: Endoscopy;  Laterality: N/A;  . I & D EXTREMITY Right 12/14/2013   Procedure: IRRIGATION AND DEBRIDEMENT RIGHT FOOT;  Surgeon: [name], MD;  Location: [facility];  Service: Orthopedics;  Laterality: Right;  . INCISION AND DRAINAGE ABSCESS  2007; 2015   "back"  . INCISION AND DRAINAGE OF WOUND Right 12/22/2013   Procedure: I&D RIGHT BUTTOCK;  Surgeon: [name], MD;  Location: [facility];  Service: Orthopedics;  Laterality: Right;  . LEFT HEART CATHETERIZATION WITH CORONARY ANGIOGRAM N/A 02/18/2013   Procedure: LEFT HEART CATHETERIZATION WITH CORONARY ANGIOGRAM;  Surgeon: [name], MD;  Location: [facility] CATH LAB;  Service: Cardiovascular;  Laterality: N/A;       Family History  Problem Relation Age of Onset  . Heart disease Mother   . Diabetes Mother   . Diabetes Maternal Uncle   . Diabetes Cousin     Social History   Tobacco Use  . Smoking status: Never Smoker  . Smokeless tobacco: Never Used  Substance Use Topics  . Alcohol use: Yes    Comment: 10/31/2016 "a few beers q 6 months or so"  . Drug use: No    Home Medications Prior to Admission medications   Medication Sig Start Date End Date Taking? Authorizing Provider  acidophilus (RISAQUAD) CAPS capsule Take 1 capsule by mouth daily.   Yes [provider]  allopurinol (ZYLOPRIM) 100 MG tablet Take 2 tablets (200 mg total) by mouth daily. 09/05/18  Yes Aldine Contes, MD  Alpha-Lipoic Acid 600 MG CAPS Take 600 mg by mouth daily.   Yes [provider]  amLODipine (NORVASC) 10 MG tablet Take 10 mg by mouth at bedtime.    Yes [provider]  apixaban (ELIQUIS) 5 MG TABS tablet Take 1 tablet (5 mg total) by mouth 2 (two) times daily. 09/02/18  Yes Sid Falcon, MD  calcitRIOL (ROCALTROL) 0.5 MCG capsule Take 0.5 mcg by mouth daily. 05/06/19  Yes [provider]  carvedilol (COREG) 25 MG tablet Take 1 tablet by mouth twice daily Patient taking differently: Take 25 mg by mouth 2 (two) times daily with a meal.  03/05/19  Yes [name], MD  cloNIDine (CATAPRES) 0.3 MG tablet Take 1 tablet (0.3 mg total) by mouth 2 (two) times daily. 10/29/18  Yes [name], MD  Cyanocobalamin (B-12) 500 MCG SUBL Place 500 mcg under the tongue daily.    Yes [provider]  famotidine (PEPCID) 20 MG tablet Take 1 tablet (20 mg total) by mouth 2 (two) times daily. Patient  taking differently: Take 20 mg by mouth 2 (two) times daily as needed for heartburn or indigestion.  05/07/18  Yes Hedges, Dellis Filbert, PA-C  furosemide (LASIX) 80 MG tablet Take 1 tablet by mouth twice daily Patient taking differently: Take 80 mg by mouth 2 (two) times daily.  03/18/19  Yes [name], MD  gabapentin (NEURONTIN) 100 MG capsule Take 1 capsule (100 mg total) by mouth 3 (three) times daily. 12/18/18  Yes Aldine Contes, MD  hydrALAZINE (APRESOLINE) 50 MG tablet Take 1 tablet by mouth 4 times daily Patient taking differently: Take 50 mg by mouth 4 (four) times daily.  02/25/19  Yes [name], MD  insulin aspart (NOVOLOG) 100 UNIT/ML FlexPen Inject 25 Units into the skin 3 (three) times daily with meals. 20 unit morning, 25 units lunch 30 units for dinner Patient taking differently: Inject 20-30 Units into the skin See admin instructions. Inject 20 unit into the skin in the morning, 25 units in the afternoon and 30 units before bedtime 10/04/18  Yes Mosetta Anis, MD  lovastatin (MEVACOR) 20 MG tablet Take 1 tablet (20 mg total) by mouth daily. Patient taking differently: Take 20 mg by mouth at bedtime.  09/24/18   Yes [name], MD  Melatonin 10 MG TABS Take 10 mg by mouth at bedtime as needed (sleep).   Yes [provider]  metolazone (ZAROXOLYN) 2.5 MG tablet Take 2.5 mg by mouth every other day. 03/30/18  Yes [provider]  Omega-3 Fatty Acids (FISH OIL) 1000 MG CAPS Take 1,000 mg by mouth at bedtime.    Yes [provider]  ondansetron (ZOFRAN ODT) 4 MG disintegrating tablet Take 1 tablet (4 mg total) by mouth every 8 (eight) hours as needed for nausea or vomiting. 04/23/18  Yes Annia Belt, MD  oxybutynin (DITROPAN) 5 MG tablet Take 5 mg by mouth 3 (three) times daily.   Yes [provider]  tamsulosin (FLOMAX) 0.4 MG CAPS capsule Take 0.4 mg by mouth at bedtime.    Yes [provider]  TRESIBA FLEXTOUCH 200 UNIT/ML FlexTouch Pen INJECT 70 UNITS SUBCUTANEOUSLY ONCE DAILY Patient taking differently: Inject 100 Units into the skin daily.  04/16/19  Yes Agyei, Caprice Kluver, MD  trimethoprim (TRIMPEX) 100 MG tablet Take 100 mg by mouth at bedtime. 12/23/18  Yes [provider]  VICTOZA 18 MG/3ML SOPN INJECT 1.2 MG (0.2 MLS) INTO THE SKIN ONCE DAILY AT THE SAME TIME. Patient taking differently: Inject 1.2 mg into the skin daily.  03/18/19  Yes [name], MD  zinc sulfate 220 (50 Zn) MG capsule Take 220 mg by mouth daily.   Yes [provider]  Accu-Chek Softclix Lancets lancets Check blood sugar up to 3 times a day 05/08/19   Velna Ochs, MD  Alpha-Lipoic Acid 200 MG CAPS Take 1 capsule (200 mg total) by mouth daily. Patient not taking: Reported on 05/21/2019 08/17/16   Kalman Shan Ratliff, DO  Blood Glucose Monitoring Suppl (ACCU-CHEK GUIDE ME) w/Device KIT Use to check blood sugar up to 3 times a day 05/06/19   [name], MD  calcitRIOL (ROCALTROL) 0.25 MCG capsule Take 1 capsule by mouth once daily Patient not taking: No sig reported 03/12/18   Kalman Shan Ratliff, DO  glucose blood (ACCU-CHEK GUIDE) test strip Check  blood sugar up to 3 times a day 05/08/19   Velna Ochs, MD  Insulin Pen Needle (B-D UF III MINI PEN NEEDLES) 31G X 5 MM MISC USE  4  TIMES DAILY 09/24/18   [name], MD  Insulin Pen Needle (PEN NEEDLES 3/16") 31G X 5 MM MISC Use five times daily 08/14/16   [name], DO  Lancet Devices [facility]) lancets Use to check blood sugars 5 times a day. Dx code:E11.9. Insulin dependent. 08/14/16   [name], DO  oxyCODONE-acetaminophen (PERCOCET/ROXICET) 5-325 MG tablet Take 1 tablet by mouth every 6 (six) hours as needed for severe pain. Patient not taking: Reported on 05/21/2019 05/01/18   Ina Homes, MD  SUMAtriptan (IMITREX) 50 MG tablet Take 1 tablet (50 mg total) by mouth every 2 (two) hours as needed for migraine. May repeat in 2 hours if headache persists or recurs. Patient not taking: Reported on 05/21/2019 06/18/18   Isabelle Course, MD    Allergies    Reglan [metoclopramide]  Review of Systems   Review of Systems  Constitutional: Positive for fatigue. Negative for chills and fever.  HENT: Negative for congestion.   Eyes: Negative for pain.  Respiratory: Positive for cough and shortness of breath.   Cardiovascular: Positive for leg swelling. Negative for chest pain.  Gastrointestinal: Positive for nausea and vomiting. Negative for abdominal pain and diarrhea.  Genitourinary: Positive for decreased urine volume. Negative for dysuria.  Musculoskeletal: Negative for myalgias.  Skin: Negative for rash.  Neurological: Negative for dizziness and headaches.    Physical Exam Updated Vital Signs BP (!) 166/80 (BP Location: Left Arm)   Pulse 90   Temp 98.4 F (36.9 C) (Oral)   Resp (!) 22   Ht 6' 2"  (1.88 m)   Wt (!) 163.3 kg   SpO2 91%   BMI 46.22 kg/m   Physical Exam Vitals and nursing note reviewed.  Constitutional:      General: He is in acute distress.     Appearance: He is obese.     Comments: Pleasant morbidly obese 44 year old  male.  Appears uncomfortable in bed.  HENT:     Head: Normocephalic and atraumatic.     Nose: Nose normal.     Mouth/Throat:     Mouth: Mucous membranes are moist.  Eyes:     General: No scleral icterus. Cardiovascular:     Rate and Rhythm: Normal rate and regular rhythm.     Pulses: Normal pulses.     Heart sounds: Normal heart sounds.  Pulmonary:     Effort: Pulmonary effort is normal. No respiratory distress.     Breath sounds: No wheezing.     Comments: Patient is mildly tachypneic with rate of twenty-two.  Mildly increased work of breathing. Lung exam limited secondary to body habitus.  No auscultated abnormalities. Abdominal:     Palpations: Abdomen is soft.     Tenderness: There is no abdominal tenderness. There is no guarding or rebound.  Musculoskeletal:     Cervical back: Normal range of motion.     Left lower leg: Edema present.     Comments: Right lower extremity is surgically absent.  Left lower extremity with significant edema trace pitting edema present however there is significant swelling.  Symptoms to palpation.  No bruising or bony tenderness.  Skin:    General: Skin is warm and dry.     Capillary Refill: Capillary refill takes less than 2 seconds.  Neurological:     Mental Status: He is alert. Mental status is at baseline.  Psychiatric:        Mood and Affect: Mood normal.        Behavior:  Behavior normal.     ED Results / Procedures / Treatments   Labs (all labs ordered are listed, but only abnormal results are displayed) Labs Reviewed  CBC WITH DIFFERENTIAL/PLATELET - Abnormal; Notable for the following components:      Result Value   WBC 13.2 (*)    RBC 3.37 (*)    Hemoglobin 9.6 (*)    HCT 31.3 (*)    Neutro Abs 10.7 (*)    Abs Immature Granulocytes 0.08 (*)    All other components within normal limits  COMPREHENSIVE METABOLIC PANEL - Abnormal; Notable for the following components:   Glucose, Bld 131 (*)    BUN 72 (*)    Creatinine, Ser 3.82  (*)    Albumin 3.0 (*)    GFR calc non Af Amer 18 (*)    GFR calc Af Amer 21 (*)    All other components within normal limits  TROPONIN I (HIGH SENSITIVITY) - Abnormal; Notable for the following components:   Troponin I (High Sensitivity) 25 (*)    All other components within normal limits  SARS CORONAVIRUS 2 BY RT PCR (HOSPITAL ORDER, [facility])  CULTURE, BLOOD (ROUTINE X 2)  CULTURE, BLOOD (ROUTINE X 2)  BRAIN NATRIURETIC PEPTIDE  LACTIC ACID, PLASMA  LACTIC ACID, PLASMA  TROPONIN I (HIGH SENSITIVITY)    EKG EKG Interpretation  Date/Time:  Wednesday May 21 2019 11:52:23 EDT Ventricular Rate:  89 PR Interval:    QRS Duration: 88 QT Interval:  369 QTC Calculation: 449 R Axis:   -23 Text Interpretation: Sinus rhythm Borderline prolonged PR interval Borderline left axis deviation Nonspecific T abnormalities, lateral leads ST elev, probable normal early repol pattern No significant change since last tracing Confirmed by Isla Pence [phone]) on 05/21/2019 11:58:40 AM   Radiology DG Chest 2 View  Result Date: 05/21/2019 CLINICAL DATA:  44 year old male with cough, shortness of breath and nausea for several days. EXAM: CHEST - 2 VIEW COMPARISON:  Chest radiographs 10/10/2018 and earlier. FINDINGS: Chronically low lung volumes. Mediastinal contours remain normal. Visualized tracheal air column is within normal limits. Ventilation in the right lung appears stable but there is multifocal confluent airspace opacity in the left lung, with evidence of both upper lobe and lower lobe consolidation. No definite superimposed left pleural effusion. Negative visible bowel gas pattern. Negative visible osseous structures. IMPRESSION: Multifocal left lung consolidation new since October compatible with Multilobar Pneumonia. No pleural effusion. Followup PA and lateral chest X-ray is recommended in 3-4 weeks following trial of antibiotic therapy to ensure resolution and  exclude underlying malignancy. Electronically Signed   By: Genevie Ann M.D.   On: 05/21/2019 13:12    Procedures .Critical Care Performed by: Tedd Sias, PA Authorized by: Tedd Sias, PA   Critical care provider statement:    Critical care time (minutes):  41   Critical care time was exclusive of:  Separately billable procedures and treating other patients and teaching time   Critical care was necessary to treat or prevent imminent or life-threatening deterioration of the following conditions:  Respiratory failure   Critical care was time spent personally by me on the following activities:  Discussions with consultants, evaluation of patient's response to treatment, examination of patient, ordering and performing treatments and interventions, ordering and review of laboratory studies, ordering and review of radiographic studies, pulse oximetry, re-evaluation of patient's condition, obtaining history from patient or surrogate and review of old charts   I assumed direction  of critical care for this patient from another provider in my specialty: no     (including critical care time)  Medications Ordered in ED Medications  cefTRIAXone (ROCEPHIN) 1 g in sodium chloride 0.9 % 100 mL IVPB (has no administration in time range)  azithromycin (ZITHROMAX) 500 mg in sodium chloride 0.9 % 250 mL IVPB (has no administration in time range)  ondansetron (ZOFRAN-ODT) disintegrating tablet 4 mg (4 mg Oral Given 05/21/19 1237)  furosemide (LASIX) injection 80 mg (80 mg Intravenous Given 05/21/19 1235)    ED Course  I have reviewed the triage vital signs and the nursing notes.  Pertinent labs & imaging results that were available during my care of the patient were reviewed by me and considered in my medical decision making (see chart for details).  Patient is 44 year old male with past medical history detailed above presented today with complaints of left leg swelling, as well as worsening productive  cough with yellow phlegm, nausea and shortness of breath that have worsened since Monday.  Physical exam notable for significant left lower extremity swelling.  Patient is on Lasix he has not taken his dose today but has been compliant with it until today.  Will give single dose of this.  Lungs are difficult to auscultate given patient's morbidly obese body habitus.  Chest x-ray will be obtained for further examination.  Concern for pneumonia VS bronchitis VS URI VS Covid.  \ Low suspicion for CHF however given patient's significant disease states and lower extremity edema will obtain BNP.  Patient is on blood thinners low suspicion for DVT.  He does have chronic DVT.  Doubt that this extremity into his shortness of breath today.  He has no chest pain therefore doubt ACS or pulmonary embolism, thoracic aortic dissection or other acute cardiopulmonary emergency.   Clinical Course as of May 20 1420  Wed May 21, 2019  1346 Disease with leukocytosis of 13.2 with neutrophil predominance and left shift.  Consistent with acute infection.  CBC with Differential/Platelet(!) [WF]  1348 Troponin of 25 patient does have chronic kidney disease.  Will trend for delta troponin.  Troponin I (High Sensitivity)(!) [WF]  1348 Creatinine and BUN at baseline for patient.  No acute abnormality with electrolytes.  Comprehensive metabolic panel(!) [WF]  0940 Chest XR reviewed by myself. Agree with radiologist read.  Multifocal left lung consolidation new since October compatible with Multilobar Pneumonia. No pleural effusion.  Followup PA and lateral chest X-ray is recommended in 3-4 weeks following trial of antibiotic therapy to ensure resolution and exclude underlying malignancy.   [WF]  1349 Patient will be started on azithromycin and Rocephin.  Will require admission for his multifocal pneumonia.  Lactic, blood cultures ordered; Covid swab switched to rapid Covid. Discussed with Dr. Gilford Raid who recommends  admission.   I discussed this case with my attending physician who cosigned this note including patient's presenting symptoms, physical exam, and planned diagnostics and interventions. Attending physician stated agreement with plan or made changes to plan which were implemented.   Attending physician assessed patient at bedside.   [WF]  1413 Urine output 700 mL during ED visit so far.   [WF]    Clinical Course User Index [WF] Tedd Sias, [place]   Discussed case with Dr. Genia Del who discussed with patient the possibility of treatment at home with "hospital at home program". Patient endorsed preference for admission. Dr. Genia Del will admit patient to internal medicine teaching service. 2:22 PM   Blood cultures, second  troponin, BNP, rapid PCR Covid test, lactic acid pending at this time.  MDM Rules/Calculators/A&P                     [name] was evaluated in Emergency Department on 05/21/2019 for the symptoms described in the history of present illness. He was evaluated in the context of the global COVID-19 pandemic, which necessitated consideration that the patient might be at risk for infection with the SARS-CoV-2 virus that causes COVID-19. Institutional protocols and algorithms that pertain to the evaluation of patients at risk for COVID-19 are in a state of rapid change based on information released by regulatory bodies including the CDC and federal and state organizations. These policies and algorithms were followed during the patient's care in the ED. Final Clinical Impression(s) / ED Diagnoses Final diagnoses:  Multifocal pneumonia    Rx / DC Orders ED Discharge Orders    None       Tedd Sias, [place] 05/21/19 1423    Pati Gallo [place], [place] 05/21/19 1431    Isla Pence, MD 05/21/19 [phone]

## 2019-05-21 NOTE — ED Notes (Signed)
Taken to XRAY

## 2019-05-21 NOTE — Telephone Encounter (Signed)
rtc to pt, got vmail, lm for rtc 

## 2019-05-21 NOTE — H&P (Signed)
Date: 05/21/2019               Patient Name:  [name] MRN: [id number]  DOB: [birth year] Age / Sex: 44 y.o., male   PCP: [name], MD         Medical Service: Internal Medicine Teaching Service         Attending Physician: Dr. Sid Falcon, MD    First Contact: Dr. Darrick Meigs Pager: [phone]  Second Contact: Dr. Sherry Ruffing Pager: [phone]       After Hours (After 5p/  First Contact Pager: [phone]  weekends / holidays): Second Contact Pager: [phone]   Chief Complaint: [facility]  History of Present Illness: [name] is a 85 old male with diabetes, hypertension, CKD stage III, OSA on CPAP therapy, and HFpEF presented to the emergency department with progressive shortness of breath. History was obtained via the patient and through chart review  Patient states that for the last 2 to 3 weeks he has noticed worsening lower extremity edema and exertional fatigue. He typically goes to the gym on Monday through Friday however due to to his fatigue he has been unable to do this. On Sunday he developed orthopnea and PND which has persisted. And then on Monday he developed a productive cough of yellow sputum. In addition to the above symptoms he has been experiencing chills, nausea, and decreased urine output. Prior to today he had been taking his furosemide as prescribed.   He currently lives alone. He has been around other people but to the best of his knowledge they have not been sick. He is not got his COVID vaccine. Review of systems is negative for headaches, visual changes, rhinorrhea, sore throat, chest pain, palpitations, abdominal pain, diarrhea/constipation, dysuria, hematuria, rash, myalgias, arthralgias.  Meds:  Current Meds  Medication Sig  . acidophilus (RISAQUAD) CAPS capsule Take 1 capsule by mouth daily.  Marland Kitchen allopurinol (ZYLOPRIM) 100 MG tablet Take 2 tablets (200 mg total) by mouth daily.  . Alpha-Lipoic Acid 600 MG CAPS Take 600 mg by mouth daily.  Marland Kitchen amLODipine  (NORVASC) 10 MG tablet Take 10 mg by mouth at bedtime.  Marland Kitchen apixaban (ELIQUIS) 5 MG TABS tablet Take 1 tablet (5 mg total) by mouth 2 (two) times daily.  . calcitRIOL (ROCALTROL) 0.5 MCG capsule Take 0.5 mcg by mouth daily.  . carvedilol (COREG) 25 MG tablet Take 1 tablet by mouth twice daily (Patient taking differently: Take 25 mg by mouth 2 (two) times daily with a meal. )  . cloNIDine (CATAPRES) 0.3 MG tablet Take 1 tablet (0.3 mg total) by mouth 2 (two) times daily.  . Cyanocobalamin (B-12) 500 MCG SUBL Place 500 mcg under the tongue daily.   . famotidine (PEPCID) 20 MG tablet Take 1 tablet (20 mg total) by mouth 2 (two) times daily. (Patient taking differently: Take 20 mg by mouth 2 (two) times daily as needed for heartburn or indigestion. )  . furosemide (LASIX) 80 MG tablet Take 1 tablet by mouth twice daily (Patient taking differently: Take 80 mg by mouth 2 (two) times daily. )  . gabapentin (NEURONTIN) 100 MG capsule Take 1 capsule (100 mg total) by mouth 3 (three) times daily.  . hydrALAZINE (APRESOLINE) 50 MG tablet Take 1 tablet by mouth 4 times daily (Patient taking differently: Take 50 mg by mouth 4 (four) times daily. )  . insulin aspart (NOVOLOG) 100 UNIT/ML FlexPen Inject 25 Units into the skin 3 (three) times daily with meals. 20 unit  morning, 25 units lunch 30 units for dinner (Patient taking differently: Inject 20-30 Units into the skin See admin instructions. Inject 20 unit into the skin in the morning, 25 units in the afternoon and 30 units before bedtime)  . lovastatin (MEVACOR) 20 MG tablet Take 1 tablet (20 mg total) by mouth daily. (Patient taking differently: Take 20 mg by mouth at bedtime. )  . Melatonin 10 MG TABS Take 10 mg by mouth at bedtime as needed (sleep).  . metolazone (ZAROXOLYN) 2.5 MG tablet Take 2.5 mg by mouth every other day.  . Omega-3 Fatty Acids (FISH OIL) 1000 MG CAPS Take 1,000 mg by mouth at bedtime.   . ondansetron (ZOFRAN ODT) 4 MG disintegrating tablet  Take 1 tablet (4 mg total) by mouth every 8 (eight) hours as needed for nausea or vomiting.  Marland Kitchen oxybutynin (DITROPAN) 5 MG tablet Take 5 mg by mouth 3 (three) times daily.  . tamsulosin (FLOMAX) 0.4 MG CAPS capsule Take 0.4 mg by mouth at bedtime.   . TRESIBA FLEXTOUCH 200 UNIT/ML FlexTouch Pen INJECT 70 UNITS SUBCUTANEOUSLY ONCE DAILY (Patient taking differently: Inject 100 Units into the skin daily. )  . trimethoprim (TRIMPEX) 100 MG tablet Take 100 mg by mouth at bedtime.  Marland Kitchen VICTOZA 18 MG/3ML SOPN INJECT 1.2 MG (0.2 MLS) INTO THE SKIN ONCE DAILY AT THE SAME TIME. (Patient taking differently: Inject 1.2 mg into the skin daily. )  . zinc sulfate 220 (50 Zn) MG capsule Take 220 mg by mouth daily.   Allergies: Allergies as of 05/21/2019 - Review Complete 05/21/2019  Allergen Reaction Noted  . Reglan [metoclopramide] Other (See Comments) 11/01/2016   Past Medical History:  Diagnosis Date  . Acute venous embolism and thrombosis of deep vessels of proximal lower extremity ([place]) 07/19/2011  . Anemia   . Anginal pain ([place])   . Chest pain, neg MI, normal coronaries by cath 02/18/2013  . CKD (chronic kidney disease) stage 3, GFR 30-59 ml/min 02/19/2013  . Colon polyps   . Diabetic ulcer of right foot ([place])   . DVT (deep venous thrombosis) ([place]) 09/2002   patient reports additional DVTs in '06 & '11 (unconfirmed)  . GERD (gastroesophageal reflux disease)   . History of blood transfusion    "related to OR" (10/31/2016)  . Hyperlipidemia 02/19/2013  . Hypertension   . Nausea & vomiting    "constant for the last couple weeks" (10/31/2016)  . Nephrotic syndrome   . Obesity    BMI 44, weight 346 pounds 01/30/14  . OSA (obstructive sleep apnea) 02/28/2018   Mild obstructive sleep apnea with an AHI of 9.8/h but severe during REM sleep with an AHI of 33.8/h.  Oxygen saturations dropped to 86% and there was moderate snoring  . Prosthesis adjustment 08/17/2016  . Pulmonary embolism ([place]) 09/2002   treated  with 6 months of warfarin  . Pyelonephritis 02/02/2018  . Type I diabetes mellitus ([place]) dx'd 2001   Family History  Problem Relation Age of Onset  . Heart disease Mother   . Diabetes Mother   . Diabetes Maternal Uncle   . Diabetes Cousin    Social History   Socioeconomic History  . Marital status: Single    Spouse name: Not on file  . Number of children: 0  . Years of education: 15.5  . Highest education level: Not on file  Occupational History  . Occupation: [employer]  . Occupation: Lexicographer: [employer]   Tobacco Use  .  Smoking status: Never Smoker  . Smokeless tobacco: Never Used  Substance and Sexual Activity  . Alcohol use: Yes    Comment: 10/31/2016 "a few beers q 6 months or so"  . Drug use: No  . Sexual activity: Yes  Other Topics Concern  . Not on file  Social History Narrative   Financial assistance approved for 100% discount at [facility] and has [employer] card per [employer]   09/01/2009.      Lives in [place] with mother and sister.            Social Determinants of Health   Financial Resource Strain:   . Difficulty of Paying Living Expenses:   Food Insecurity:   . Worried About Charity fundraiser in the Last Year:   . Arboriculturist in the Last Year:   Transportation Needs:   . Film/video editor (Medical):   Marland Kitchen Lack of Transportation (Non-Medical):   Physical Activity:   . Days of Exercise per Week:   . Minutes of Exercise per Session:   Stress:   . Feeling of Stress :   Social Connections:   . Frequency of Communication with Friends and Family:   . Frequency of Social Gatherings with Friends and Family:   . Attends Religious Services:   . Active Member of Clubs or Organizations:   . Attends Archivist Meetings:   Marland Kitchen Marital Status:   Intimate Partner Violence:   . Fear of Current or Ex-Partner:   . Emotionally Abused:   Marland Kitchen Physically Abused:   . Sexually Abused:    Review of Systems: A complete ROS was negative  except as per HPI.   Physical Exam: Blood pressure (!) 166/80, pulse 90, temperature 98.4 F (36.9 C), temperature source Oral, resp. rate (!) 22, height 6\' 2"  (1.88 m), weight (!) 163.3 kg, SpO2 91 %.  General: Morbidly obese male in no acute distress HENT: Normocephalic, atraumatic, moist mucus membranes Pulm: Limited by obesity but no wheezing or crackles  CV: RRR, no murmurs, no rubs  Abdomen: Active bowel sounds, soft, non-distended, no tenderness to palpation  Extremities: Pulses palpable in all extremities, right above the knee amputation. Left lower extremity is 1 to 2+ pitting edema to the mid tibia. Skin: Warm and dry  Neuro: Alert and oriented x 3  EKG: personally reviewed my interpretation is sinus rhythm with indeterminate axis. T-wave immersion in lead I and aVL. Overall stable compared to EKG on 10/10/2018.  CXR: personally reviewed my interpretation is poor inspiration and penetration. Increased pulmonary interstitial markings, worse on the left.   Assessment & Plan by Problem: Active Problems:   Acute respiratory failure with hypoxia (HCC)  Tobyn Osgood is a 87 old male with diabetes, hypertension, CKD stage III, OSA on CPAP therapy, and HFpEF presented to the emergency department with progressive shortness of breath of 4 to 5 days duration. He was found to be hypoxic requiring 2 to 3 L nasal cannula with leukocytosis and left pulmonary infiltrate. He was subsequently admitted for acute hypoxic respiratory failure secondary to multifocal pneumonia.  Acute hypoxic respiratory failure secondary to multifocal pneumonia. - Patient presenting with 4 to 5 days of progressive shortness of breath. Found to have left-sided infiltrate with leukocytosis. - Currently hemodynamically stable but requiring 2 to 3 L nasal cannula - Follow-up blood cultures - Start Ceftriaxone 1g (day 1 of 5) and Azithromycin 500 mg (day 1 of 3) - PRN albuterol - Wean as tolerated  Acute on Chronic  HFpEF, metabolic phenotype  Hypertension  - Endorsing symptoms consistent with heart failure including orthopnea/PND, increasing lower extremity edema, and decreased urine output. - Received IV furosemide 80 mg in the ED with good urine output.  - Continue amlodipine 10 mg QHS, carvedilol 26 mg BID, clonidine 0.3 mg BID, hydralazine 50 mg Q ID - Follows with Dr. Radford Pax as an outpatient. She is requested an echocardiogram prior to the patient getting bariatric surgery. Will obtain while inpatient.  Morbid Obesity  - Was planning for bariatric surgery however this was canceled for further cardiac workup. - Obtain echocardiogram while inpatient.  CKD Stage III (?FSGS due to Obesity) Chronic Anemia - Renal function currently at baseline - Avoid nephrotoxic medications - Continue to monitor  DM  - Outpatient medication include Tresiba 70 units QD, NovoLog 25 mg TID, and Victoza 1.2 mg QD -Will start Lantus 50 units q.d. with NovoLog 10 units TID with meals and sliding scale TID with meals  Hx of DVT  - Continue Eliquis  Diet: Carb/renal  VTE ppx: Eliquis CODE STATUS: Full Code  Dispo: Admit patient to Observation with expected length of stay less than 2 midnights.  SignedIna Homes, MD 05/21/2019, 2:49 PM  Pager: [phone]

## 2019-05-21 NOTE — Telephone Encounter (Signed)
Pt states he has swollen on his leg, coughing and unable to sleep at night. Please call pt back.

## 2019-05-21 NOTE — ED Triage Notes (Signed)
C/o L leg swelling x 3 weeks.  Reports productive cough with yellow phlegm, nausea, and SOB since Monday.

## 2019-05-22 ENCOUNTER — Observation Stay (HOSPITAL_COMMUNITY): Payer: Medicare Other

## 2019-05-22 DIAGNOSIS — R0602 Shortness of breath: Secondary | ICD-10-CM

## 2019-05-22 DIAGNOSIS — Z86711 Personal history of pulmonary embolism: Secondary | ICD-10-CM | POA: Diagnosis not present

## 2019-05-22 DIAGNOSIS — E10649 Type 1 diabetes mellitus with hypoglycemia without coma: Secondary | ICD-10-CM | POA: Diagnosis not present

## 2019-05-22 DIAGNOSIS — Z8249 Family history of ischemic heart disease and other diseases of the circulatory system: Secondary | ICD-10-CM | POA: Diagnosis not present

## 2019-05-22 DIAGNOSIS — Z833 Family history of diabetes mellitus: Secondary | ICD-10-CM | POA: Diagnosis not present

## 2019-05-22 DIAGNOSIS — Z86718 Personal history of other venous thrombosis and embolism: Secondary | ICD-10-CM | POA: Diagnosis not present

## 2019-05-22 DIAGNOSIS — I130 Hypertensive heart and chronic kidney disease with heart failure and stage 1 through stage 4 chronic kidney disease, or unspecified chronic kidney disease: Secondary | ICD-10-CM | POA: Diagnosis present

## 2019-05-22 DIAGNOSIS — K219 Gastro-esophageal reflux disease without esophagitis: Secondary | ICD-10-CM | POA: Diagnosis present

## 2019-05-22 DIAGNOSIS — N1832 Chronic kidney disease, stage 3b: Secondary | ICD-10-CM | POA: Diagnosis present

## 2019-05-22 DIAGNOSIS — Z794 Long term (current) use of insulin: Secondary | ICD-10-CM | POA: Diagnosis not present

## 2019-05-22 DIAGNOSIS — J189 Pneumonia, unspecified organism: Secondary | ICD-10-CM | POA: Diagnosis present

## 2019-05-22 DIAGNOSIS — I5033 Acute on chronic diastolic (congestive) heart failure: Secondary | ICD-10-CM | POA: Diagnosis present

## 2019-05-22 DIAGNOSIS — Z888 Allergy status to other drugs, medicaments and biological substances status: Secondary | ICD-10-CM | POA: Diagnosis not present

## 2019-05-22 DIAGNOSIS — G4733 Obstructive sleep apnea (adult) (pediatric): Secondary | ICD-10-CM | POA: Diagnosis present

## 2019-05-22 DIAGNOSIS — E1022 Type 1 diabetes mellitus with diabetic chronic kidney disease: Secondary | ICD-10-CM | POA: Diagnosis present

## 2019-05-22 DIAGNOSIS — Z7289 Other problems related to lifestyle: Secondary | ICD-10-CM | POA: Diagnosis not present

## 2019-05-22 DIAGNOSIS — Z79899 Other long term (current) drug therapy: Secondary | ICD-10-CM | POA: Diagnosis not present

## 2019-05-22 DIAGNOSIS — Z7901 Long term (current) use of anticoagulants: Secondary | ICD-10-CM | POA: Diagnosis not present

## 2019-05-22 DIAGNOSIS — E785 Hyperlipidemia, unspecified: Secondary | ICD-10-CM | POA: Diagnosis present

## 2019-05-22 DIAGNOSIS — Z6841 Body Mass Index (BMI) 40.0 and over, adult: Secondary | ICD-10-CM | POA: Diagnosis not present

## 2019-05-22 DIAGNOSIS — J9601 Acute respiratory failure with hypoxia: Secondary | ICD-10-CM | POA: Diagnosis present

## 2019-05-22 DIAGNOSIS — D631 Anemia in chronic kidney disease: Secondary | ICD-10-CM | POA: Diagnosis present

## 2019-05-22 DIAGNOSIS — Z89611 Acquired absence of right leg above knee: Secondary | ICD-10-CM | POA: Diagnosis not present

## 2019-05-22 DIAGNOSIS — Z20822 Contact with and (suspected) exposure to covid-19: Secondary | ICD-10-CM | POA: Diagnosis present

## 2019-05-22 LAB — BASIC METABOLIC PANEL
Anion gap: 10 (ref 5–15)
BUN: 78 mg/dL — ABNORMAL HIGH (ref 6–20)
CO2: 22 mmol/L (ref 22–32)
Calcium: 8.4 mg/dL — ABNORMAL LOW (ref 8.9–10.3)
Chloride: 103 mmol/L (ref 98–111)
Creatinine, Ser: 3.96 mg/dL — ABNORMAL HIGH (ref 0.61–1.24)
GFR calc Af Amer: 20 mL/min — ABNORMAL LOW (ref >60)
GFR calc non Af Amer: 18 mL/min — ABNORMAL LOW (ref >60)
Glucose, Bld: 153 mg/dL — ABNORMAL HIGH (ref 70–99)
Potassium: 4.4 mmol/L (ref 3.5–5.1)
Sodium: 135 mmol/L (ref 135–145)

## 2019-05-22 LAB — GLUCOSE, CAPILLARY
Glucose-Capillary: 102 mg/dL — ABNORMAL HIGH (ref 70–99)
Glucose-Capillary: 119 mg/dL — ABNORMAL HIGH (ref 70–99)
Glucose-Capillary: 126 mg/dL — ABNORMAL HIGH (ref 70–99)
Glucose-Capillary: 183 mg/dL — ABNORMAL HIGH (ref 70–99)

## 2019-05-22 LAB — ECHOCARDIOGRAM COMPLETE
Height: 74 in
Weight: 5760 oz

## 2019-05-22 LAB — CBC
HCT: 27.7 % — ABNORMAL LOW (ref 39.0–52.0)
Hemoglobin: 8.3 g/dL — ABNORMAL LOW (ref 13.0–17.0)
MCH: 28.3 pg (ref 26.0–34.0)
MCHC: 30 g/dL (ref 30.0–36.0)
MCV: 94.5 fL (ref 80.0–100.0)
Platelets: 341 10*3/uL (ref 150–400)
RBC: 2.93 MIL/uL — ABNORMAL LOW (ref 4.22–5.81)
RDW: 14.9 % (ref 11.5–15.5)
WBC: 11.8 10*3/uL — ABNORMAL HIGH (ref 4.0–10.5)
nRBC: 0 % (ref 0.0–0.2)

## 2019-05-22 MED ORDER — TRIMETHOPRIM 100 MG PO TABS
100.0000 mg | ORAL_TABLET | Freq: Every day | ORAL | Status: DC
  Administered 2019-05-22 – 2019-05-25 (×4): 100 mg via ORAL
  Filled 2019-05-22 (×4): qty 1

## 2019-05-22 MED ORDER — TORSEMIDE 20 MG PO TABS
80.0000 mg | ORAL_TABLET | Freq: Two times a day (BID) | ORAL | Status: DC
  Administered 2019-05-22 – 2019-05-25 (×7): 80 mg via ORAL
  Filled 2019-05-22 (×7): qty 4

## 2019-05-22 MED ORDER — SODIUM CHLORIDE 0.9 % IV SOLN
2.0000 g | INTRAVENOUS | Status: DC
  Administered 2019-05-22: 2 g via INTRAVENOUS
  Filled 2019-05-22: qty 20
  Filled 2019-05-22: qty 2

## 2019-05-22 NOTE — Plan of Care (Signed)

## 2019-05-22 NOTE — Progress Notes (Signed)
  Echocardiogram 2D Echocardiogram has been performed.  [name] Kellin Fifer 05/22/2019, 8:37 AM

## 2019-05-22 NOTE — Progress Notes (Signed)
  Date: 05/22/2019  Patient name: [name]  Medical record number: [id number]  Date of birth: 1975-09-20   I have seen and evaluated [name] and discussed their care with the Residency Team. Briefly, [name] is a 44 year old man with PMH of DM, HTN, CKD stage 3b, OSA on CPAP, HFpEF who presented with SOB.  He noted 2-3 weeks of worsening SOB, fatigue and edema.  He has had decreased exercise tolerance, chills, nausea and decreased UOP.  He has been taking his lasix.  Covid was negative.  CXR showed worsening left lung multifocal consolidation concerning for pneumonia.  WBC was elevated. He was admitted for pneumonia treatment.    Vitals:   05/22/19 1210 05/22/19 1602  BP:  (!) 142/78  Pulse: 79 76  Resp: 17 17  Temp:  98.6 F (37 C)  SpO2: 96% 96%   General: Sitting up on side of bed, wearing oxygen, NAD Eyes: Anicteric sclerae HENT: Neck is supple, unable to assess JVD CV: RR, NR, no murmur Pulm: Decreased breath sounds due to body habitus, crackles at bases, no apparent wheezing Abd: Obese, soft MSK: s/p right AKA, 2+ edema to left lower extremity Skin: warm, dry, no breakdown noted.   Assessment and Plan: I have seen and evaluated the patient as outlined above. I agree with the formulated Assessment and Plan as detailed in the residents' note, with the following changes:   1. Acute hypoxic respiratory failure due to multifocal pneumonia - Continue oxygen as needed to keep O2 saturation > 90% - Continue antibiotics for CAP, monitor for improvement in leukocytosis - PRN albuterol - Blood cultures X 2 pending  2. Acute on chronic HFpEF and HTN - No further IV diuretics - Started on torsemide today to help get fluid off of leg - TTE pending - Strict I/O, daily weight  Monitor on telemetry.   Other issues per Dr. Chase Picket daily note  Sid Falcon, MD 5/13/20217:52 PM

## 2019-05-22 NOTE — Progress Notes (Signed)
NAMEArthor Kelley, MRN:  [id number], DOB:  25-Oct-1975, LOS: 0 ADMISSION DATE:  05/21/2019   Brief History  44 yo male diabetes, HTN, CKD III, OSA, and HFpEF who presented to the emergency department with progressive shortness of breath for 4-5d who was found to by hypoxic requiring 2-3L [place]. ED evaluation revealed multifocal pna with leukocytosis.  Subjective   No overnight events. No complaints this morning. Discussed lab work and chest xray results.  He was able to lay flat to sleep last night. He denies any fever, chills or worsening in his SOB today. He continues to have a non-productive cough. He does endorse significantly swollen legs though. Patient requests a urinary work up, but denies any urinary symptoms at this time, including dysuria.   We discussed plan to continue antibiotics at this time. All questions and concerns addressed.  Significant Hospital Events   5/12 hospital admission 5/13 starting p.o. torsemide 80 mg twice daily  Objective   Blood pressure (!) 152/70, pulse 81, temperature 98.7 F (37.1 C), temperature source Oral, resp. rate 17, height 6\' 2"  (1.88 m), weight (!) 163.3 kg, SpO2 95 %.     Intake/Output Summary (Last 24 hours) at 05/22/2019 0456 Last data filed at 05/21/2019 2335 Gross per 24 hour  Intake 588.96 ml  Output 1900 ml  Net -1311.04 ml   Filed Weights   05/21/19 1136 05/21/19 1703  Weight: (!) 163.3 kg (!) 163.3 kg    Examination: GENERAL: in no acute distress CARDIAC: Heart sounds distant due to body habitus.  Radial pulse is regular rate and rhythm.  Unable to assess JVD due to neck girth.  Significant left lower extremity +4 pitting edema. PULMONARY: Lung sounds distant  Consults:  None  Significant Diagnostic Tests:  5/12 chest x-ray: Multifocal left lung consolidation new since October compatible with multilobar pneumonia.  No pleural effusion.  Recommended chest x-ray follow-up in 3 to 4 weeks to ensure resolution and exclude  underlying malignancy.  Micro Data:  5/12 blood cultures x2-no growth to date  Antimicrobials:  Ceftriaxone  5/12> Azithromycin 5/12  Labs    CBC Latest Ref Rng & Units 05/22/2019 05/21/2019 05/15/2019  WBC 4.0 - 10.5 K/uL 11.8(H) 13.2(H) -  Hemoglobin 13.0 - 17.0 g/dL 8.3(L) 9.6(L) 9.8(L)  Hematocrit 39.0 - 52.0 % 27.7(L) 31.3(L) -  Platelets 150 - 400 K/uL 341 362 -   BMP Latest Ref Rng & Units 05/22/2019 05/21/2019 05/08/2019  Glucose 70 - 99 mg/dL 153(H) 131(H) 183(H)  BUN 6 - 20 mg/dL 78(H) 72(H) 77(H)  Creatinine 0.61 - 1.24 mg/dL 3.96(H) 3.82(H) 4.07(H)  BUN/Creat Ratio 9 - 20 - - -  Sodium 135 - 145 mmol/L 135 136 135  Potassium 3.5 - 5.1 mmol/L 4.4 4.4 4.0  Chloride 98 - 111 mmol/L 103 103 102  CO2 22 - 32 mmol/L 22 22 24   Calcium 8.9 - 10.3 mg/dL 8.4(L) 9.1 9.4    Summary  44 yo male with CKDIII, HTN, DM, HFpEF admitted to IMTS on 5/12 for acute hypoxic respiratory failure secondary to multifocal pna.  Assessment & Plan:  Active Problems:   Acute respiratory failure with hypoxia (HCC)  Acute hypoxic respiratory failure secondary to Multifocal PNA covid negative Remains on 2L supplemental oxygen Plan --continue rocephin (day 2/5) and azithromycin (day 2/3) --prn albuterol --wean supplemental O2 as able --follow blood x2  Decompensated HFpEF. Follows with Dr. Radford Pax in cards.  Admission weight 163kg Net volume since admission: -1.3L Hypertension Plan: echo per  Dr. Theodosia Blender request. Continue amlodipine, carvedilol, clonidine, hydralazine.  We will start 80 mg p.o. torsemide twice daily strict I&O's.  Telemetry monitoring.  Daily weights.  T2DM.  Blood sugar stable. continue 50U Lantus, 10U mealtime novolog, and SSI  History of DVT. Continue eliquis  CKDIII. Renal function at baseline. Continue to monitor intermittently. Avoid nephrotoxins  Best practice:  CODE STATUS: FULL Diet: carb/renal DVT for prophylaxis: eliquis Social considerations/Family  communication: per pt Dispo: pending acute hypoxic respiratory failure resolution   Mitzi Hansen, MD INTERNAL MEDICINE RESIDENT PGY-1 PAGER #: [phone] 05/22/19  4:57 AM

## 2019-05-23 LAB — GLUCOSE, CAPILLARY
Glucose-Capillary: 55 mg/dL — ABNORMAL LOW (ref 70–99)
Glucose-Capillary: 71 mg/dL (ref 70–99)
Glucose-Capillary: 96 mg/dL (ref 70–99)
Glucose-Capillary: 164 mg/dL — ABNORMAL HIGH (ref 70–99)
Glucose-Capillary: 129 mg/dL — ABNORMAL HIGH (ref 70–99)
Glucose-Capillary: 120 mg/dL — ABNORMAL HIGH (ref 70–99)

## 2019-05-23 LAB — CBC
HCT: 27.8 % — ABNORMAL LOW (ref 39.0–52.0)
Hemoglobin: 8.3 g/dL — ABNORMAL LOW (ref 13.0–17.0)
MCH: 28.4 pg (ref 26.0–34.0)
MCHC: 29.9 g/dL — ABNORMAL LOW (ref 30.0–36.0)
MCV: 95.2 fL (ref 80.0–100.0)
Platelets: 322 10*3/uL (ref 150–400)
RBC: 2.92 MIL/uL — ABNORMAL LOW (ref 4.22–5.81)
RDW: 15 % (ref 11.5–15.5)
WBC: 9.1 10*3/uL (ref 4.0–10.5)
nRBC: 0 % (ref 0.0–0.2)

## 2019-05-23 LAB — BASIC METABOLIC PANEL WITH GFR
Anion gap: 11 (ref 5–15)
BUN: 88 mg/dL — ABNORMAL HIGH (ref 6–20)
CO2: 19 mmol/L — ABNORMAL LOW (ref 22–32)
Calcium: 8.5 mg/dL — ABNORMAL LOW (ref 8.9–10.3)
Chloride: 106 mmol/L (ref 98–111)
Creatinine, Ser: 4.27 mg/dL — ABNORMAL HIGH (ref 0.61–1.24)
GFR calc Af Amer: 19 mL/min — ABNORMAL LOW
GFR calc non Af Amer: 16 mL/min — ABNORMAL LOW
Glucose, Bld: 104 mg/dL — ABNORMAL HIGH (ref 70–99)
Potassium: 4.2 mmol/L (ref 3.5–5.1)
Sodium: 136 mmol/L (ref 135–145)

## 2019-05-23 MED ORDER — AMOXICILLIN-POT CLAVULANATE 875-125 MG PO TABS
1.0000 | ORAL_TABLET | Freq: Two times a day (BID) | ORAL | Status: AC
  Administered 2019-05-23 – 2019-05-24 (×3): 1 via ORAL
  Filled 2019-05-23 (×3): qty 1

## 2019-05-23 MED ORDER — INSULIN ASPART 100 UNIT/ML ~~LOC~~ SOLN
5.0000 [IU] | Freq: Three times a day (TID) | SUBCUTANEOUS | Status: DC
  Administered 2019-05-23 – 2019-05-24 (×4): 5 [IU] via SUBCUTANEOUS

## 2019-05-23 NOTE — Progress Notes (Signed)
Subjective: Patient reports that he is still having some shortness of breath, reports that it is slightly better than when he came in.  He urinated a lot yesterday, reports that it is about the same as it has been is at home.  He was reporting that his food was bland.  He is concerned about the low blood sugars yesterday and this morning, discussed that we have been decreasing his insulin to try to avoid this however since he is on such a high dose we do not want to decrease it too much.  He is also concerned about being switched to oral antibiotics yesterday, discussed that we try to incision from IV to oral medications whenever we can yesterday we felt for this to be done.  He expressed understanding.  He is also concerned about his breathing and wanted to know how his breathing would do when he moves around that we will try to see if he can be wheeled around the floor to assess his symptoms. Nurse and patient also asking for his home melatonin, will give ramelteon while here.  Objective:  Vital signs in last 24 hours: Vitals:   05/23/19 1914 05/23/19 2133 05/23/19 2355 05/24/19 0256  BP: (!) 158/98 (!) 156/70 (!) 148/78 134/69  Pulse:   84 84  Resp: 20  (!) 22 20  Temp: 97.8 F (36.6 C)  98.3 F (36.8 C) 97.8 F (36.6 C)  TempSrc: Oral  Oral Oral  SpO2: 93%  97% 100%  Weight:      Height:       General: Alert male, no acute distress, laying in bed Cardiac: Regular rate and rhythm, no murmurs rubs or gallops Pulmonary: Basilar crackles, no wheezing, on 1 L nasal cannula, normal work of breathing Abdomen: Soft, nontender, nondistended active bowel sounds Extremities: Right AKA, LLE 3+ edema with Unna boot in place  Assessment/Plan:  Active Problems:   Acute respiratory failure with hypoxia (HCC)   Multifocal pneumonia  This is a 44 year old male with a history of CKD stage III, hypertension, diabetes, and HFpEF who presented with acute hypoxic respiratory failure secondary to  multifocal pneumonia versus heart failure exacerbation.  Acute hypoxic respiratory failure: Unclear if this is secondary to multifocal pneumonia versus decompensated heart failure exacerbation.  We have been treating with antibiotics and diuresing him.  He did have a leukocytosis on admission that is improving and he has been remaining afebrile since admission.  He completed a 3-day course of azithromycin is currently on amoxicillin with end date on 5/16.  He has great urine output, yesterday at output of 5.8 L.  Reports some symptomatic improvement, weaning down on supplemental oxygen, hopefully will be off today.  Overall he is doing well with that diuresis and CAP treatment.  -Continue amoxicillin, end date 5/16 -Continue albuterol as needed -Currently on 1 L nasal cannula, wean as tolerated -Out of bed -Continue diuresis with torsemide 80 mg p.o. twice daily -Strict ins and outs -Daily weights  Heart failure with preserved ejection fraction: Echocardiogram on 5/13 showed EF of 60-65%, unremarkable.  Admission weight of 163 kg. Orthopnea has been improving. Currently dieresing as above.  Still hypervolemic on exam. -Continue amlodipine, carvedilol, clonidine, hydralazine.  Diabetes Mellitus: CBG went down to 56 this morning, at this time it is 120. On Novolog 5 units TID, Lantus 50 units daily, and resistent SSI.  He had decreased his NovoLog yesterday.  Did have a hypoglycemic episode yesterday morning as well.  Given the recurrent  hypoglycemic episodes we will decrease his Lantus today.  -Continue NovoLog 5 units 3 times daily -Decrease Lantus to 40 units daily -Continue sliding scale insulin, resistant -Frequent CBGs  CKD stage 3: Creatinine today is 4.02, near his baseline.  No further work-up. -Monitor BMPs  Prior to Admission Living Arrangement: Home  Anticipated Discharge Location: TBD Barriers to Discharge: Pending clinical improvement Dispo: Anticipated discharge in  approximately 1-2 day(s).   Asencion Noble, MD 05/24/2019, 6:04 AM Pager: [phone]

## 2019-05-23 NOTE — Progress Notes (Signed)
Orthopedic Tech Progress Note Patient Details:  [name] 1975/03/10 [id number]  Ortho Devices Type of Ortho Device: Haematologist Ortho Device/Splint Location: LLE Ortho Device/Splint Interventions: Application   Post Interventions Patient Tolerated: Well Instructions Provided: Care of device   [name] [name] 05/23/2019, 8:45 PM

## 2019-05-23 NOTE — Progress Notes (Signed)
Paged Dr. Darrick Meigs Pt requests that ECHO beings sent to Dr. Volney American at [facility]. Paged returned, MD advised MD should have access to through [facility] system. RN will advise patient.

## 2019-05-23 NOTE — Progress Notes (Signed)
NAMETrystian Kelley, MRN:  [id number], DOB:  August 16, 1975, LOS: 1 ADMISSION DATE:  05/21/2019   Brief History  44 yo male diabetes, HTN, CKD III, OSA, and HFpEF who presented to the emergency department with progressive shortness of breath for 4-5d who was found to by hypoxic requiring 2-3L [place]. ED evaluation revealed multifocal pna with leukocytosis.  Subjective  No overnight events Still having some shortness of breath and not feeling back to himself. Discussed echo results.  Significant Hospital Events   5/12 hospital admission 5/13 starting p.o. torsemide 80 mg twice daily 5/14 continue to diurese and treat for pneumonia  Objective   Blood pressure 140/61, pulse 77, temperature 98.7 F (37.1 C), temperature source Oral, resp. rate (!) 23, height 6\' 2"  (1.88 m), weight (!) 163.3 kg, SpO2 96 %.     Intake/Output Summary (Last 24 hours) at 05/23/2019 1135 Last data filed at 05/23/2019 0813 Gross per 24 hour  Intake 460 ml  Output 3150 ml  Net -2690 ml   Filed Weights   05/21/19 1136 05/21/19 1703  Weight: (!) 163.3 kg (!) 163.3 kg    Examination: GENERAL: in no acute distress CARDIAC: Heart regular rate and rhythm.  Lower extremity +4 pitting edema. PULMONARY: Breathing comfortably on 2 L.  Bibasilar crackles.  Consults:  None  Significant Diagnostic Tests:  5/12 chest x-ray: Multifocal left lung consolidation new since October compatible with multilobar pneumonia.  No pleural effusion.  Recommended chest x-ray follow-up in 3 to 4 weeks to ensure resolution and exclude underlying malignancy.  5/13 echocardiogram: EF 60-65%. Indeterminate diastolic parameters. No evidence of valvular disease. Micro Data:  5/12 blood cultures x2-no growth to date  Antimicrobials:  Ceftriaxone  5/12> Azithromycin 5/12>5/14  Labs    CBC Latest Ref Rng & Units 05/23/2019 05/22/2019 05/21/2019  WBC 4.0 - 10.5 K/uL 9.1 11.8(H) 13.2(H)  Hemoglobin 13.0 - 17.0 g/dL 8.3(L) 8.3(L) 9.6(L)   Hematocrit 39.0 - 52.0 % 27.8(L) 27.7(L) 31.3(L)  Platelets 150 - 400 K/uL 322 341 362   BMP Latest Ref Rng & Units 05/23/2019 05/22/2019 05/21/2019  Glucose 70 - 99 mg/dL 104(H) 153(H) 131(H)  BUN 6 - 20 mg/dL 88(H) 78(H) 72(H)  Creatinine 0.61 - 1.24 mg/dL 4.27(H) 3.96(H) 3.82(H)  BUN/Creat Ratio 9 - 20 - - -  Sodium 135 - 145 mmol/L 136 135 136  Potassium 3.5 - 5.1 mmol/L 4.2 4.4 4.4  Chloride 98 - 111 mmol/L 106 103 103  CO2 22 - 32 mmol/L 19(L) 22 22  Calcium 8.9 - 10.3 mg/dL 8.5(L) 8.4(L) 9.1    Summary  44 yo male with CKDIII, HTN, DM, HFpEF admitted to [facility]MT[facility] on 5/12 for acute hypoxic respiratory failure secondary to multifocal pna and heart failure exacerbation.  Assessment & Plan:  Active Problems:   Acute respiratory failure with hypoxia (HCC)   Multifocal pneumonia  Acute hypoxic respiratory failure secondary to Multifocal PNA covid negative. Blood cultures NGTD Remains on 2L supplemental oxygen Leukocytosis resolved. Afebrile. Plan --Will transition to oral amoxicillin today (end date 5/16) and continue azithromycin (day 3/3) --prn albuterol --wean supplemental O2 as able --follow blood x2  Decompensated HFpEF. Follows with Dr. Radford Pax in cards.  5/13 echocardiogram wnl Admission weight 163kg.  Working on getting a weight for today. Net volume since admission: -3.4L Hypertension Plan: Continue diuresing with torsemide 80 mg p.o. twice daily.  Continue amlodipine, carvedilol, clonidine, hydralazine.  Telemetry monitoring.  Strict I/Os. Daily weights.  T2DM.  Patient's had a couple hypoglycemic events.  Will decrease mealtime NovoLog to 5 units.  Continue 50U Lantus and resistant SSI.  Continue to monitor CBGs  History of DVT. Continue eliquis  CKDIII.  Renal function still within range of his baseline.  Continue to monitor intermittently. Avoid nephrotoxins  Best practice:  CODE STATUS: FULL Diet: carb/renal DVT for prophylaxis: eliquis Social  considerations/Family communication: per pt Dispo: pending acute hypoxic respiratory failure resolution   Mitzi Hansen, MD INTERNAL MEDICINE RESIDENT PGY-1 PAGER #: [phone] 05/23/19 11:35 AM

## 2019-05-23 NOTE — Progress Notes (Signed)
Orthopedic Tech Progress Note Patient Details:  [name] [birth year] [id number] Patient refused UNNA BOOT until he has a "bath". I did notify the RN and left MATERIAL  for UNNA BOOT. Patient ID: [name], male   DOB: November 07, 1975, 44 y.o.   MRN: [id number]   Janit Pagan 05/23/2019, 4:52 PM

## 2019-05-23 NOTE — Progress Notes (Signed)
Orthopedic Tech Progress Note Patient Details:  [name] 23-Aug-1975 [id number] RN called around [phone] requesting UNNA BOOTS to be applied to patient, I told her to order 2 4' COBANS and ill apply them once her materials had came. Patient ID: [name], male   DOB: March 18, 1975, 44 y.o.   MRN: [id number]   Janit Pagan 05/23/2019, 3:31 PM

## 2019-05-23 NOTE — Progress Notes (Signed)
RN notified ortho patient ready to have unna boots placed.

## 2019-05-24 LAB — BASIC METABOLIC PANEL
Anion gap: 11 (ref 5–15)
BUN: 90 mg/dL — ABNORMAL HIGH (ref 6–20)
CO2: 23 mmol/L (ref 22–32)
Calcium: 8.9 mg/dL (ref 8.9–10.3)
Chloride: 105 mmol/L (ref 98–111)
Creatinine, Ser: 4.02 mg/dL — ABNORMAL HIGH (ref 0.61–1.24)
GFR calc Af Amer: 20 mL/min — ABNORMAL LOW (ref >60)
GFR calc non Af Amer: 17 mL/min — ABNORMAL LOW (ref >60)
Glucose, Bld: 101 mg/dL — ABNORMAL HIGH (ref 70–99)
Potassium: 3.8 mmol/L (ref 3.5–5.1)
Sodium: 139 mmol/L (ref 135–145)

## 2019-05-24 LAB — GLUCOSE, CAPILLARY
Glucose-Capillary: 56 mg/dL — ABNORMAL LOW (ref 70–99)
Glucose-Capillary: 120 mg/dL — ABNORMAL HIGH (ref 70–99)
Glucose-Capillary: 67 mg/dL — ABNORMAL LOW (ref 70–99)
Glucose-Capillary: 155 mg/dL — ABNORMAL HIGH (ref 70–99)
Glucose-Capillary: 133 mg/dL — ABNORMAL HIGH (ref 70–99)
Glucose-Capillary: 190 mg/dL — ABNORMAL HIGH (ref 70–99)
Glucose-Capillary: 183 mg/dL — ABNORMAL HIGH (ref 70–99)

## 2019-05-24 MED ORDER — RAMELTEON 8 MG PO TABS
8.0000 mg | ORAL_TABLET | Freq: Every day | ORAL | Status: DC
  Administered 2019-05-24: 8 mg via ORAL
  Filled 2019-05-24: qty 1

## 2019-05-24 MED ORDER — INSULIN GLARGINE 100 UNIT/ML ~~LOC~~ SOLN
40.0000 [IU] | Freq: Every day | SUBCUTANEOUS | Status: DC
  Administered 2019-05-24: 40 [IU] via SUBCUTANEOUS
  Filled 2019-05-24 (×2): qty 0.4

## 2019-05-24 NOTE — Plan of Care (Signed)
  Problem: Education: Goal: Knowledge of General Education information will improve Description: Including pain rating scale, medication(s)/side effects and non-pharmacologic comfort measures Outcome: Progressing   Problem: Clinical Measurements: Goal: Respiratory complications will improve Outcome: Progressing   Problem: Nutrition: Goal: Adequate nutrition will be maintained Outcome: Progressing   Problem: Coping: Goal: Level of anxiety will decrease Outcome: Progressing   Problem: Pain Managment: Goal: General experience of comfort will improve Outcome: Progressing   Problem: Safety: Goal: Ability to remain free from injury will improve Outcome: Progressing   

## 2019-05-24 NOTE — Progress Notes (Signed)
Hypoglycemic Event  CBG: 56  Treatment:  Grape juice  Symptoms:Asymptomatic  Follow-up CBG: Time:0630 CBG Result:67  Possible Reasons for Event: 50 units of Lantus last night  Comments/MD notified:MD not notified. Blood sugar partially corrected. Will continue to monitor and recheck the blood sugar in 15 minutes.     [name]

## 2019-05-24 NOTE — Progress Notes (Signed)
  Date: 05/24/2019  Patient name: [name]  Medical record number: [id number]  Date of birth: Mar 09, 1975        I have seen and evaluated this patient and I have discussed the plan of care with the house staff. Please see Dr. Dorothyann Peng note for complete details. I concur with her findings and plan.  Patient with Louretta Parma boot on.  Diuresing well.  Saturating well on 1LNC.  Possible d/c tomorrow if continues to do well.   Sid Falcon, MD 05/24/2019, 11:56 AM

## 2019-05-25 LAB — BASIC METABOLIC PANEL
Anion gap: 13 (ref 5–15)
BUN: 88 mg/dL — ABNORMAL HIGH (ref 6–20)
CO2: 22 mmol/L (ref 22–32)
Calcium: 9 mg/dL (ref 8.9–10.3)
Chloride: 100 mmol/L (ref 98–111)
Creatinine, Ser: 3.93 mg/dL — ABNORMAL HIGH (ref 0.61–1.24)
GFR calc Af Amer: 20 mL/min — ABNORMAL LOW (ref >60)
GFR calc non Af Amer: 18 mL/min — ABNORMAL LOW (ref >60)
Glucose, Bld: 171 mg/dL — ABNORMAL HIGH (ref 70–99)
Potassium: 3.5 mmol/L (ref 3.5–5.1)
Sodium: 135 mmol/L (ref 135–145)

## 2019-05-25 LAB — GLUCOSE, CAPILLARY: Glucose-Capillary: 103 mg/dL — ABNORMAL HIGH (ref 70–99)

## 2019-05-25 NOTE — Discharge Summary (Signed)
 Name: [name] MRN: [id number] DOB: 04/07/1975 44 y.o. PCP: Winters, Steven, MD  Date of Admission: 05/21/2019 11:34 AM Date of Discharge: 05/25/2019 Attending Physician: Dr. Mullen  Discharge Diagnosis: 1.  Acute hypoxic respiratory failure-resolved 2.  Community-acquired pneumonia 3.  HFpEF exacerbation  Discharge Medications: Allergies as of 05/25/2019      Reactions   Reglan [metoclopramide] Other (See Comments)   Dysphoric reaction      Medication List    STOP taking these medications   oxyCODONE-acetaminophen 5-325 MG tablet Commonly known as: PERCOCET/ROXICET   SUMAtriptan 50 MG tablet Commonly known as: IMITREX     TAKE these medications   Accu-Chek Guide Me w/Device Kit Use to check blood sugar up to 3 times a day   Accu-Chek Guide test strip Generic drug: glucose blood Check blood sugar up to 3 times a day   accu-chek softclix lancets Use to check blood sugars 5 times a day. Dx code:E11.9. Insulin dependent.   Accu-Chek Softclix Lancets lancets Check blood sugar up to 3 times a day   acidophilus Caps capsule Take 1 capsule by mouth daily.   allopurinol 100 MG tablet Commonly known as: ZYLOPRIM Take 2 tablets (200 mg total) by mouth daily.   Alpha-Lipoic Acid 600 MG Caps Take 600 mg by mouth daily. What changed: Another medication with the same name was removed. Continue taking this medication, and follow the directions you see here.   amLODipine 10 MG tablet Commonly known as: NORVASC Take 10 mg by mouth at bedtime.   apixaban 5 MG Tabs tablet Commonly known as: Eliquis Take 1 tablet (5 mg total) by mouth 2 (two) times daily.   B-12 500 MCG Subl Place 500 mcg under the tongue daily.   calcitRIOL 0.25 MCG capsule Commonly known as: ROCALTROL Take 1 capsule by mouth once daily   calcitRIOL 0.5 MCG capsule Commonly known as: ROCALTROL Take 0.5 mcg by mouth daily.   carvedilol 25 MG tablet Commonly known as: COREG Take 1 tablet by  mouth twice daily What changed: when to take this   cloNIDine 0.3 MG tablet Commonly known as: CATAPRES Take 1 tablet by mouth twice daily   famotidine 20 MG tablet Commonly known as: PEPCID Take 1 tablet (20 mg total) by mouth 2 (two) times daily. What changed:   when to take this  reasons to take this   Fish Oil 1000 MG Caps Take 1,000 mg by mouth at bedtime.   furosemide 80 MG tablet Commonly known as: LASIX Take 1 tablet by mouth twice daily What changed: when to take this   gabapentin 100 MG capsule Commonly known as: NEURONTIN Take 1 capsule (100 mg total) by mouth 3 (three) times daily.   hydrALAZINE 50 MG tablet Commonly known as: APRESOLINE Take 1 tablet by mouth 4 times daily What changed: See the new instructions.   insulin aspart 100 UNIT/ML FlexPen Commonly known as: NOVOLOG Inject 25 Units into the skin 3 (three) times daily with meals. 20 unit morning, 25 units lunch 30 units for dinner What changed:   how much to take  when to take this  additional instructions   lovastatin 20 MG tablet Commonly known as: MEVACOR Take 1 tablet (20 mg total) by mouth daily. What changed: when to take this   Melatonin 10 MG Tabs Take 10 mg by mouth at bedtime as needed (sleep).   metolazone 2.5 MG tablet Commonly known as: ZAROXOLYN Take 2.5 mg by mouth every other day.     ondansetron 4 MG disintegrating tablet Commonly known as: Zofran ODT Take 1 tablet (4 mg total) by mouth every 8 (eight) hours as needed for nausea or vomiting.   oxybutynin 5 MG tablet Commonly known as: DITROPAN Take 5 mg by mouth 3 (three) times daily.   Pen Needles 3/16" 31G X 5 MM Misc Use five times daily   B-D UF III MINI PEN NEEDLES 31G X 5 MM Misc Generic drug: Insulin Pen Needle USE  4 TIMES DAILY   tamsulosin 0.4 MG Caps capsule Commonly known as: FLOMAX Take 0.4 mg by mouth at bedtime.   Tresiba FlexTouch 200 UNIT/ML FlexTouch Pen Generic drug: insulin  degludec INJECT 70 UNITS SUBCUTANEOUSLY ONCE DAILY What changed: See the new instructions.   trimethoprim 100 MG tablet Commonly known as: TRIMPEX Take 100 mg by mouth at bedtime.   Victoza 18 MG/3ML Sopn Generic drug: liraglutide INJECT 1.2 MG (0.2 MLS) INTO THE SKIN ONCE DAILY AT THE SAME TIME. What changed: See the new instructions.   zinc sulfate 220 (50 Zn) MG capsule Take 220 mg by mouth daily.       Disposition and follow-up:   Mr.Treshaun [name] was discharged from [facility] in Stable condition.  At the hospital follow up visit please address:  1.  Community-acquired pneumonia versus pulmonary edema/heart failure exacerbation.  Admission chest x-ray showed multifocal infiltrates bilaterally with predominance on the left.  Still remains unclear if this was infectious versus cardiac.  Symptoms improved after 5-day antibiotic course for presumed pneumonia and increased diuresis with torsemide 80 mg twice daily.  On discharge, I resumed his normal home dose of Lasix 80 mg twice daily. --Please assess volume status hospital follow-up.  Adjust diuretics as needed  2.  Labs / imaging needed at time of follow-up: BMP  3.  Pending labs/ test needing follow-up: None  Follow-up Appointments: Follow-up Information    Winters, Steven, MD Follow up in 3 day(s).   Specialty: Internal Medicine Contact information: [address] [place] [address] [phone]        Turner, Traci R, MD .   Specialty: Cardiology Contact information: [address]  [place] [address] [phone]           Hospital Course: 44-year-old male with past medical history of morbid obesity, heart failure with preserved ejection fraction present [place] emergency department on 05/21/2019 for progressive shortness of breath.  On arrival to the ED, he was found to be hypoxic requiring 2 to 3 L on nasal cannula.  ED work-up revealed leukocytosis and  chest x-ray that showed multifocal pneumonia (left greater than right) versus pulmonary edema. He completed 5-day course of CAP treatment including azithromycin and rocephin which was transitioned to amoxacillin.  We also diuresed with 80 mg torsemide twice daily with a net -5 L volume status at discharge.  Leukocytosis resolved in he was on room air on day of discharge.  Overall, it still remains a little unclear if this was pneumonia versus pulmonary edema resulting in the acute hypoxic respiratory failure.  My thoughts lean towards pneumonia due to the asymmetric appearance on x-ray of the infiltrates.  Symptoms improved with antibiotics but we are also diuresing at the same time so that does not really help delineate these two.  Discharge Vitals:   BP (!) 157/81   Pulse 82   Temp 98.1 F (36.7 C) (Oral)   Resp 19   Ht 6' 2" (1.88 m)     Wt (!) 169.7 kg   SpO2 97%   BMI 48.03 kg/m   Pertinent Labs, Studies, and Procedures:  CBC Latest Ref Rng & Units 05/23/2019 05/22/2019 05/21/2019  WBC 4.0 - 10.5 K/uL 9.1 11.8(H) 13.2(H)  Hemoglobin 13.0 - 17.0 g/dL 8.3(L) 8.3(L) 9.6(L)  Hematocrit 39.0 - 52.0 % 27.8(L) 27.7(L) 31.3(L)  Platelets 150 - 400 K/uL 322 341 362   BMP Latest Ref Rng & Units 05/25/2019 05/24/2019 05/23/2019  Glucose 70 - 99 mg/dL 171(H) 101(H) 104(H)  BUN 6 - 20 mg/dL 88(H) 90(H) 88(H)  Creatinine 0.61 - 1.24 mg/dL 3.93(H) 4.02(H) 4.27(H)  BUN/Creat Ratio 9 - 20 - - -  Sodium 135 - 145 mmol/L 135 139 136  Potassium 3.5 - 5.1 mmol/L 3.5 3.8 4.2  Chloride 98 - 111 mmol/L 100 105 106  CO2 22 - 32 mmol/L 22 23 19(L)  Calcium 8.9 - 10.3 mg/dL 9.0 8.9 8.5(L)   05/22/2019 echocardiogram: Left ventricular ejection fraction 60 to 65%.  The left ventricle has normal function.  No regional wall motion abnormalities.  Diastolic parameters are indeterminate.  Right ventricular systolic function normal.  No valvular abnormalities.  Discharge Instructions: Discharge Instructions     Activity as tolerated - No restrictions   Complete by: As directed       Signed: Rylee Christian, MD 05/25/2019, 12:49 PM   Pager: [phone]  

## 2019-05-25 NOTE — Progress Notes (Signed)
NAMEJarron Kelley, MRN:  [id number], DOB:  [birth year], LOS: 3 ADMISSION DATE:  05/21/2019   Brief History  44 yo male diabetes, HTN, CKD III, OSA, and HFpEF who presented to the emergency department with progressive shortness of breath for 4-5d who was found to by hypoxic requiring 2-3L [place]. ED evaluation revealed multifocal pna with leukocytosis vs pulm edema.  Subjective  Discussed plans for discharge today.  Discussed that he will be his last day of antibiotics that he may resume activity as tolerated.  Significant Hospital Events   5/12 hospital admission 5/13 starting p.o. torsemide 80 mg twice daily 5/14 continue to diurese and treat for pneumonia 5/15 wean down to 1 L on nasal cannula 5/16 now on room air.  Discharged  Objective   Blood pressure (!) 159/89, pulse 82, temperature 98.1 F (36.7 C), temperature source Oral, resp. rate 19, height 6\' 2"  (1.88 m), weight (!) 163.3 kg, SpO2 97 %.     Intake/Output Summary (Last 24 hours) at 05/25/2019 0629 Last data filed at 05/25/2019 0425 Gross per 24 hour  Intake 1120 ml  Output 4975 ml  Net -3855 ml   Filed Weights   05/21/19 1136 05/21/19 1703  Weight: (!) 163.3 kg (!) 163.3 kg    Examination: GENERAL: in no acute distress CARDIAC: RRR. Compression wrapping to LLE PULMONARY: breathing comfortably on room air. Lung sounds distant but no adventitious sounds appreciated  Consults:  None  Significant Diagnostic Tests:  5/12 chest x-ray: Multifocal left lung consolidation new since October compatible with multilobar pneumonia.  No pleural effusion.  Recommended chest x-ray follow-up in 3 to 4 weeks to ensure resolution and exclude underlying malignancy.  5/13 echocardiogram: EF 60-65%. Indeterminate diastolic parameters. No evidence of valvular disease. Micro Data:  5/12 blood cultures x2-no growth   Antimicrobials:  Ceftriaxone  5/12> 5/14 Azithromycin 5/12>5/14 Amoxicillin 5/14>5/16  Labs    CBC Latest Ref  Rng & Units 05/23/2019 05/22/2019 05/21/2019  WBC 4.0 - 10.5 K/uL 9.1 11.8(H) 13.2(H)  Hemoglobin 13.0 - 17.0 g/dL 8.3(L) 8.3(L) 9.6(L)  Hematocrit 39.0 - 52.0 % 27.8(L) 27.7(L) 31.3(L)  Platelets 150 - 400 K/uL 322 341 362   BMP Latest Ref Rng & Units 05/25/2019 05/24/2019 05/23/2019  Glucose 70 - 99 mg/dL 171(H) 101(H) 104(H)  BUN 6 - 20 mg/dL 88(H) 90(H) 88(H)  Creatinine 0.61 - 1.24 mg/dL 3.93(H) 4.02(H) 4.27(H)  BUN/Creat Ratio 9 - 20 - - -  Sodium 135 - 145 mmol/L 135 139 136  Potassium 3.5 - 5.1 mmol/L 3.5 3.8 4.2  Chloride 98 - 111 mmol/L 100 105 106  CO2 22 - 32 mmol/L 22 23 19(L)  Calcium 8.9 - 10.3 mg/dL 9.0 8.9 8.5(L)    Summary  44 yo male with CKDIII, HTN, DM, HFpEF admitted to IMTS on 5/12 for acute hypoxic respiratory failure secondary to multifocal pna and heart failure exacerbation.  Assessment & Plan:  Active Problems:   Acute respiratory failure with hypoxia (HCC)   Multifocal pneumonia  Acute hypoxic respiratory failure secondary to Multifocal PNA Now on room air today.  Completed 3-day course with azithromycin.  Today will be the last day of amoxicillin to complete CAP treatment Plan --CAP Treatment DAY #5 out of 5.  DC amoxicillin after today. --prn albuterol  Decompensated HFpEF. Follows with Dr. Radford Pax in cards.  5/13 echocardiogram wnl Admission weight 163kg.   Net volume since admission: -11.5 L Hypertension.  Blood pressure stable. Plan: transition to home dose lasix in anticipation  of discharge today.  Continue amlodipine, carvedilol, clonidine, hydralazine.    T2DM.  Blood sugars are doing better.  Lantus 40 units at bedtime, 5 units NovoLog with meals, NovoLog sliding scale at bedtime, NovoLog mealtime sliding scale  History of DVT. Continue eliquis  CKDIII.  At baseline  Best practice:  CODE STATUS: FULL Diet: carb/renal DVT for prophylaxis: eliquis Social considerations/Family communication: per pt Dispo: Discharge home today   Mitzi Hansen, MD [place] PGY-1 PAGER #: [phone] 05/25/19 6:29 AM

## 2019-05-25 NOTE — Plan of Care (Signed)
  Problem: Education: Goal: Knowledge of General Education information will improve Description: Including pain rating scale, medication(s)/side effects and non-pharmacologic comfort measures Outcome: Progressing   Problem: Health Behavior/Discharge Planning: Goal: Ability to manage health-related needs will improve Outcome: Progressing   Problem: Clinical Measurements: Goal: Will remain free from infection Outcome: Progressing Goal: Diagnostic test results will improve Outcome: Progressing   Problem: Activity: Goal: Risk for activity intolerance will decrease Outcome: Progressing   Problem: Coping: Goal: Level of anxiety will decrease Outcome: Progressing

## 2019-05-26 ENCOUNTER — Other Ambulatory Visit (HOSPITAL_COMMUNITY): Payer: Medicare Other

## 2019-05-26 ENCOUNTER — Telehealth: Payer: Self-pay | Admitting: Cardiology

## 2019-05-26 LAB — CULTURE, BLOOD (ROUTINE X 2)
Culture: NO GROWTH
Culture: NO GROWTH
Special Requests: ADEQUATE
Special Requests: ADEQUATE

## 2019-05-26 NOTE — Telephone Encounter (Signed)
Echo is fine

## 2019-05-26 NOTE — Telephone Encounter (Signed)
Patient was in the hospital from 05-12 to 05-16. The patient had an echocardiogram done while he was in the hospital and was wondering if Dr. Radford Pax could evaluate that echocardiogram and let him know what she thinks. He did not go to the one that was scheduled for 05-17 because he had one in the hospital

## 2019-05-27 ENCOUNTER — Ambulatory Visit: Payer: Medicare Other

## 2019-05-27 NOTE — Telephone Encounter (Signed)
Left message for patient letting him know that Dr. Radford Pax reviewed his echocardiogram and she said it was fine. Advised to call back with any further questions.

## 2019-05-28 NOTE — Telephone Encounter (Signed)
Please order a split night sleep study

## 2019-05-28 NOTE — Telephone Encounter (Signed)
Patients machine was picked up on 03/20/19 for non compliance. DME states he will need an in lab sleep study to get restarted on pap therapy.

## 2019-05-29 ENCOUNTER — Other Ambulatory Visit (HOSPITAL_COMMUNITY): Payer: Medicare Other

## 2019-05-29 ENCOUNTER — Ambulatory Visit: Payer: Medicare Other

## 2019-05-29 NOTE — Telephone Encounter (Signed)
DME received the order and states patient has to start over with an in lab sleep study.

## 2019-06-03 ENCOUNTER — Other Ambulatory Visit: Payer: Self-pay

## 2019-06-03 ENCOUNTER — Ambulatory Visit (INDEPENDENT_AMBULATORY_CARE_PROVIDER_SITE_OTHER): Payer: Medicare Other | Admitting: Internal Medicine

## 2019-06-03 ENCOUNTER — Encounter: Payer: Self-pay | Admitting: Internal Medicine

## 2019-06-03 VITALS — BP 134/71 | HR 76 | Temp 98.2°F | Ht 76.0 in | Wt 366.2 lb

## 2019-06-03 DIAGNOSIS — I5032 Chronic diastolic (congestive) heart failure: Secondary | ICD-10-CM | POA: Diagnosis not present

## 2019-06-03 LAB — GLUCOSE, CAPILLARY: Glucose-Capillary: 259 mg/dL — ABNORMAL HIGH (ref 70–99)

## 2019-06-03 MED ORDER — CARVEDILOL 25 MG PO TABS: 25.0000 mg | ORAL_TABLET | Freq: Two times a day (BID) | ORAL | 1 refills | Status: DC

## 2019-06-03 MED ORDER — FUROSEMIDE 80 MG PO TABS: 80.0000 mg | ORAL_TABLET | Freq: Two times a day (BID) | ORAL | 0 refills | Status: DC

## 2019-06-03 NOTE — Progress Notes (Signed)
   CC: Hospital follow up for acute hypoxic respiratory failure  HPI:  [name] is a 44 y.o. with htn, chronic diastolic heart failure, ckd3, iddm type 2, hx of dvt and pe who presents for hosptial follow up. Please see problem based charting for evaluation, assessment, and plan.  Past Medical History:  Diagnosis Date  . Acute venous embolism and thrombosis of deep vessels of proximal lower extremity ([place]) 07/19/2011  . Anemia   . Anginal pain ([place])   . Chest pain, neg MI, normal coronaries by cath 02/18/2013  . CKD (chronic kidney disease) stage 3, GFR 30-59 ml/min 02/19/2013  . Colon polyps   . Diabetic ulcer of right foot ([place])   . DVT (deep venous thrombosis) ([place]) 09/2002   patient reports additional DVTs in '06 & '11 (unconfirmed)  . GERD (gastroesophageal reflux disease)   . History of blood transfusion    "related to OR" (10/31/2016)  . Hyperlipidemia 02/19/2013  . Hypertension   . Nausea & vomiting    "constant for the last couple weeks" (10/31/2016)  . Nephrotic syndrome   . Obesity    BMI 44, weight 346 pounds 01/30/14  . OSA (obstructive sleep apnea) 02/28/2018   Mild obstructive sleep apnea with an AHI of 9.8/h but severe during REM sleep with an AHI of 33.8/h.  Oxygen saturations dropped to 86% and there was moderate snoring  . Prosthesis adjustment 08/17/2016  . Pulmonary embolism ([place]) 09/2002   treated with 6 months of warfarin  . Pyelonephritis 02/02/2018  . Type I diabetes mellitus ([place]) dx'd 2001   Review of Systems:    Per hpi  Physical Exam:  Vitals:   06/03/19 0916  BP: 134/71  Pulse: 76  Temp: 98.2 F (36.8 C)  TempSrc: Oral  SpO2: 100%  Weight: (!) 366 lb 3.2 oz (166.1 kg)  Height: 6\' 4"  (1.93 m)   Physical Exam  Constitutional: He appears well-developed and well-nourished. No distress.  HENT:  Head: Normocephalic and atraumatic.  Cardiovascular: Normal rate, regular rhythm and normal heart sounds.  Respiratory: Effort normal and breath  sounds normal. No respiratory distress. He has no wheezes.  Musculoskeletal:        General: No edema.  Neurological: He is alert.  Skin: He is not diaphoretic.  Psychiatric: He has a normal mood and affect. His behavior is normal. Judgment and thought content normal.     Assessment & Plan:   See Encounters Tab for problem based charting.  Patient discussed with Dr. Lynnae January

## 2019-06-03 NOTE — Patient Instructions (Addendum)
It was a pleasure to see you today [name]. Please make the following changes:  -please continue the medication listed on this form -we checked your electrolytes today  -follow up in 4 weeks  -get chest xray in 3-4 weeks   If you have any questions or concerns, please call our clinic at [phone] between 9am-5pm and after hours call [phone] and ask for the internal medicine resident on call. If you feel you are having a medical emergency please call 911.   Thank you, we look forward to help you remain healthy!  Lars Mage, MD Internal Medicine PGY3   To schedule an appointment for a COVID vaccine or be added to the vaccine wait list: Go to [email or web address]   OR Go to [email or web address]                  OR Call [phone]                                     OR Call [phone] and select Option 2  [facility] ([place]) [address] [place], [place] [address] Hours: Mon,Thu 8-5, Sat 8-12 Type: [place] (12+)  [facility]  ([place]) [place] [employer] [place],  [address] Hours: Thu: 1-5 Type: Pfizer (12+)

## 2019-06-04 LAB — BMP8+ANION GAP
Anion Gap: 15 mmol/L (ref 10.0–18.0)
BUN/Creatinine Ratio: 20 (ref 9–20)
BUN: 63 mg/dL — ABNORMAL HIGH (ref 6–24)
CO2: 19 mmol/L — ABNORMAL LOW (ref 20–29)
Calcium: 9.1 mg/dL (ref 8.7–10.2)
Chloride: 103 mmol/L (ref 96–106)
Creatinine, Ser: 3.12 mg/dL — ABNORMAL HIGH (ref 0.76–1.27)
GFR calc Af Amer: 27 mL/min/{1.73_m2} — ABNORMAL LOW (ref >59)
GFR calc non Af Amer: 23 mL/min/{1.73_m2} — ABNORMAL LOW (ref >59)
Glucose: 243 mg/dL — ABNORMAL HIGH (ref 65–99)
Potassium: 4.4 mmol/L (ref 3.5–5.2)
Sodium: 137 mmol/L (ref 134–144)

## 2019-06-05 NOTE — Assessment & Plan Note (Signed)
The patient was admitted from 5/12-5/16 for respiratory failure requiring 3L via [place] thought to be secondary to community-acquired pneumonia versus pulmonary edema.  The patient was given a 5-day course of azithromycin and ceftriaxone (later transitioned to amoxicillin) and treated with increased dose of diuretics (torsemide 80 mg twice daily).  The patient was discharged on Lasix 80 mg twice daily.  Echo done on 05/22/2019 showed EF 60 to 65%, no regional wall motion abnormalities, IVC dilation.  Patient's weight on discharge was 374lbs and today is 366lbs.  He was respirating on room air.  During this visit the patient denies chest pain, dyspnea, fever, chills, nausea, chills. States that he has not had pneumonia in the past.   Assessment and plan  -Continue carvedilol 25 mg twice daily -Continue Lasix 80 mg twice daily -follow up with cardiology -BMP

## 2019-06-10 NOTE — Addendum Note (Signed)
Addended by: Larey Dresser A on: 06/10/2019 11:19 AM   Modules accepted: Level of Service

## 2019-06-10 NOTE — Progress Notes (Signed)
Internal Medicine Clinic Attending  Case discussed with Dr. Chundi at the time of the visit.  We reviewed the resident's history and exam and pertinent patient test results.  I agree with the assessment, diagnosis, and plan of care documented in the resident's note. 

## 2019-06-11 DIAGNOSIS — I129 Hypertensive chronic kidney disease with stage 1 through stage 4 chronic kidney disease, or unspecified chronic kidney disease: Secondary | ICD-10-CM | POA: Diagnosis not present

## 2019-06-11 DIAGNOSIS — Z794 Long term (current) use of insulin: Secondary | ICD-10-CM | POA: Diagnosis not present

## 2019-06-11 DIAGNOSIS — D631 Anemia in chronic kidney disease: Secondary | ICD-10-CM | POA: Diagnosis not present

## 2019-06-11 DIAGNOSIS — Z6841 Body Mass Index (BMI) 40.0 and over, adult: Secondary | ICD-10-CM | POA: Diagnosis not present

## 2019-06-11 DIAGNOSIS — E1122 Type 2 diabetes mellitus with diabetic chronic kidney disease: Secondary | ICD-10-CM | POA: Diagnosis not present

## 2019-06-11 DIAGNOSIS — N183 Chronic kidney disease, stage 3 unspecified: Secondary | ICD-10-CM | POA: Diagnosis not present

## 2019-06-11 DIAGNOSIS — N390 Urinary tract infection, site not specified: Secondary | ICD-10-CM | POA: Diagnosis not present

## 2019-06-11 DIAGNOSIS — N2581 Secondary hyperparathyroidism of renal origin: Secondary | ICD-10-CM | POA: Diagnosis not present

## 2019-06-12 ENCOUNTER — Encounter (HOSPITAL_COMMUNITY): Payer: Medicare Other

## 2019-06-16 ENCOUNTER — Ambulatory Visit (HOSPITAL_COMMUNITY)
Admission: RE | Admit: 2019-06-16 | Discharge: 2019-06-16 | Disposition: A | Discharge status: Home / Self Care | Payer: Medicare Other | Source: Ambulatory Visit | Attending: Nephrology | Admitting: Nephrology

## 2019-06-16 ENCOUNTER — Other Ambulatory Visit: Payer: Self-pay

## 2019-06-16 VITALS — BP 163/80 | HR 79 | Temp 97.2°F | Resp 18

## 2019-06-16 DIAGNOSIS — D631 Anemia in chronic kidney disease: Secondary | ICD-10-CM | POA: Insufficient documentation

## 2019-06-16 DIAGNOSIS — N183 Chronic kidney disease, stage 3 unspecified: Secondary | ICD-10-CM | POA: Insufficient documentation

## 2019-06-16 LAB — FERRITIN: Ferritin: 423 ng/mL — ABNORMAL HIGH (ref 24–336)

## 2019-06-16 LAB — IRON AND TIBC
Iron: 46 ug/dL (ref 45–182)
Saturation Ratios: 17 % — ABNORMAL LOW (ref 17.9–39.5)
TIBC: 273 ug/dL (ref 250–450)
UIBC: 227 ug/dL

## 2019-06-16 LAB — POCT HEMOGLOBIN-HEMACUE: Hemoglobin: 10.2 g/dL — ABNORMAL LOW (ref 13.0–17.0)

## 2019-06-16 MED ORDER — DARBEPOETIN ALFA 100 MCG/0.5ML IJ SOSY
PREFILLED_SYRINGE | INTRAMUSCULAR | Status: AC
  Filled 2019-06-16: qty 0.5

## 2019-06-16 MED ORDER — DARBEPOETIN ALFA 100 MCG/0.5ML IJ SOSY
100.0000 ug | PREFILLED_SYRINGE | INTRAMUSCULAR | Status: DC
  Administered 2019-06-16: 100 ug via SUBCUTANEOUS

## 2019-06-17 ENCOUNTER — Telehealth: Payer: Self-pay | Admitting: Cardiology

## 2019-06-17 NOTE — Telephone Encounter (Signed)
Pt reached out to Dr. Radford Pax in regards to his request for male enhancement. He would need Dr. Radford Pax to contact his Urologist Dr. Junious Silk at [facility] [phone]

## 2019-06-18 NOTE — Telephone Encounter (Signed)
Spoke with [facility] and Dr. Junious Silk would like clearance from Dr. Radford Pax to start the patient on sildenafil.

## 2019-06-18 NOTE — Telephone Encounter (Signed)
Spoke with the patient who states that he has been talking with his urologist about taking male enhancement medications. He is requesting that we speak with Dr. Junious Silk in regards to this to make sure it is okay.

## 2019-06-18 NOTE — Telephone Encounter (Signed)
I am fine with that 

## 2019-06-18 NOTE — Telephone Encounter (Signed)
Left message at [facility] letting them know that Dr. Radford Pax is okay with the patient taking sildenafil.

## 2019-06-25 ENCOUNTER — Other Ambulatory Visit: Payer: Self-pay | Admitting: *Deleted

## 2019-06-26 MED ORDER — GABAPENTIN 100 MG PO CAPS: 100.0000 mg | ORAL_CAPSULE | Freq: Three times a day (TID) | ORAL | 1 refills | Status: DC

## 2019-06-26 MED ORDER — CLONIDINE HCL 0.3 MG PO TABS: 0.3000 mg | ORAL_TABLET | Freq: Two times a day (BID) | ORAL | 1 refills | Status: DC

## 2019-06-27 ENCOUNTER — Other Ambulatory Visit: Payer: Self-pay | Admitting: Internal Medicine

## 2019-06-27 NOTE — Telephone Encounter (Signed)
Rx was denied.  Patient no longer under providers care.//AB/CMA

## 2019-06-30 ENCOUNTER — Telehealth: Payer: Self-pay | Admitting: Endocrinology

## 2019-06-30 NOTE — Telephone Encounter (Signed)
Patient called wanting to let Dr Dwyane Dee know that he is getting Gastric Bypass surgery this Sunday and wanted to inform Dr Dwyane Dee of this.

## 2019-07-01 ENCOUNTER — Encounter (HOSPITAL_COMMUNITY): Payer: Self-pay

## 2019-07-01 NOTE — Telephone Encounter (Signed)
FYI

## 2019-07-01 NOTE — Patient Instructions (Addendum)
DUE TO COVID-19 ONLY ONE VISITOR ARE ALLOWED TO COME WITH YOU AND STAY IN THE WAITING ROOM ONLY DURING PRE OP AND PROCEDURE. THEN TWO VISITORS MAY VISIT WITH YOU IN YOUR PRIVATE ROOM DURING VISITING HOURS ONLY!!   COVID SWAB TESTING MUST BE COMPLETED ON:   Thursday, July 03, 2019 at 2:40PM [address] [place] [facility] pre [facility] testing line (Must self quarantine after testing. Follow instructions on handout.)             Your procedure is scheduled on: Monday, July 07, 2019   Report to [facility] Main  Entrance   Report to Short Stay at 5:30 AM   [facility])   Call this number if you have problems the morning of surgery [phone]   NO SOLID FOOD AFTER 600 PM THE NIGHT BEFORE YOUR SURGERY.    YOU MAY DRINK CLEAR FLUIDS UNTIL 4:30 AM DAY OF SURGERY   CLEAR LIQUID DIET  Foods Allowed                                                                     Foods Excluded  Water, Black Coffee and tea, regular and decaf                             liquids that you cannot  Plain Jell-O in any flavor  (No red)                                           see through such as: Fruit ices (not with fruit pulp)                                     milk, soups, orange juice  Iced Popsicles (No red)                                    All solid food                                   Apple juices Sports drinks like Gatorade (No red) Lightly seasoned clear broth or consume(fat free) Sugar, honey syrup  Sample Menu Breakfast                                Lunch                                     Supper Cranberry juice                    Beef broth  Chicken broth Jell-O                                     Grape juice                           Apple juice Coffee or tea                        Jell-O                                      Popsicle                                                Coffee or tea                         Coffee or tea   MORNING OF SURGERY DRINK:   DRINK 1 G2 drink BEFORE YOU LEAVE HOME, DRINK ALL OF THE  G2 DRINK AT ONE TIME.    THE G2 DRINK YOU DRINK BEFORE YOU LEAVE HOME WILL BE THE LAST FLUIDS YOU DRINK BEFORE SURGERY.   Oral Hygiene is also important to reduce your risk of infection.                                    Remember - BRUSH YOUR TEETH THE MORNING OF SURGERY WITH YOUR REGULAR TOOTHPASTE   Do NOT smoke after Midnight   Take these medicines the morning of surgery with A SIP OF WATER: Allopurinol, Carvedilol, Clonidine, Gabapentin, Hydralazine, Oxybutynin,       THE MORNING OF SURGERY, take  50  units of  Tresiba       insulin.  DO NOT TAKE ANY ORAL DIABETIC MEDICATIONS DAY OF YOUR SURGERY                               You may not have any metal on your body including jewelry, and body piercings             Do not wear lotions, powders, perfumes/cologne, or deodorant                        Men may shave face and neck.   Do not bring valuables to the hospital. [place].   Contacts, dentures or bridgework may not be worn into surgery.   Bring small overnight bag day of surgery.    Special Instructions: Bring a copy of your healthcare power of attorney and living will documents         the day of surgery if you haven't scanned them in before.     PAIN IS EXPECTED AFTER SURGERY AND WILL NOT BE COMPLETELY ELIMINATED. AMBULATION AND TYLENOL WILL HELP REDUCE INCISIONAL AND GAS PAIN. MOVEMENT IS KEY!   YOU ARE EXPECTED TO BE OUT  OF BED WITHIN 4 HOURS OF ADMISSION TO YOUR PATIENT ROOM.   SITTING IN THE RECLINER THROUGHOUT THE DAY IS IMPORTANT FOR DRINKING FLUIDS AND MOVING GAS THROUGHOUT THE GI TRACT.   COMPRESSION STOCKINGS SHOULD BE WORN [place] UNLESS YOU ARE WALKING.    INCENTIVE SPIROMETER SHOULD BE USED EVERY HOUR WHILE AWAKE TO DECREASE POST-OPERATIVE COMPLICATIONS SUCH AS PNEUMONIA.   WHEN  DISCHARGED HOME, IT IS IMPORTANT TO CONTINUE TO WALK EVERY HOUR AND USE THE INCENTIVE SPIROMETER EVERY HOUR.                Please read over the following fact sheets you were given: IF YOU HAVE QUESTIONS ABOUT YOUR PRE OP [place] [phone]   How to Manage Your Diabetes Before and After Surgery  Why is it important to control my blood sugar before and after surgery? . Improving blood sugar levels before and after surgery helps healing and can limit problems. . A way of improving blood sugar control is eating a healthy diet by: o  Eating less sugar and carbohydrates o  Increasing activity/exercise o  Talking with your doctor about reaching your blood sugar goals . High blood sugars (greater than 180 mg/dL) can raise your risk of infections and slow your recovery, so you will need to focus on controlling your diabetes during the weeks before surgery. . Make sure that the doctor who takes care of your diabetes knows about your planned surgery including the date and location.  How do I manage my blood sugar before surgery? . Check your blood sugar at least 4 times a day, starting 2 days before surgery, to make sure that the level is not too high or low. o Check your blood sugar the morning of your surgery when you wake up and every 2 hours until you get to the Short Stay unit. . If your blood sugar is less than 70 mg/dL, you will need to treat for low blood sugar: o Do not take insulin. o Treat a low blood sugar (less than 70 mg/dL) with  cup of clear juice (cranberry or apple), 4 glucose tablets, OR glucose gel. o Recheck blood sugar in 15 minutes after treatment (to make sure it is greater than 70 mg/dL). If your blood sugar is not greater than 70 mg/dL on recheck, call [phone] for further instructions. . Report your blood sugar to the short stay nurse when you get to Short Stay.  . If you are admitted to the hospital after surgery: o Your blood sugar will be  checked by the staff and you will probably be given insulin after surgery (instead of oral diabetes medicines) to make sure you have good blood sugar levels. o The goal for blood sugar control after surgery is 80-180 mg/dL.   WHAT DO I DO ABOUT MY DIABETES MEDICATION?  Marland Kitchen Do not take oral diabetes medicines (pills) the morning of surgery      . THE MORNING OF SURGERY, take  50  units of  Tresiba       insulin.  . The day of surgery, do not take other diabetes injectables, including Byetta (exenatide), Bydureon (exenatide ER), Victoza (liraglutide), or Trulicity (dulaglutide).  . If your CBG is greater than 220 mg/dL, you may take  of your sliding scale  . (correction) dose of insulin  Reviewed and Endorsed by [facility] Patient Education Committee, August 2015  [facility] - Preparing for Surgery Before surgery, you can play an important role.  Because skin is not sterile, your skin needs to be as free of germs as possible.  You can reduce the number of germs on your skin by washing with CHG (chlorahexidine gluconate) soap before surgery.  CHG is an antiseptic cleaner which kills germs and bonds with the skin to continue killing germs even after washing. Please DO NOT use if you have an allergy to CHG or antibacterial soaps.  If your skin becomes reddened/irritated stop using the CHG and inform your nurse when you arrive at Short Stay. Do not shave (including legs and underarms) for at least 48 hours prior to the first CHG shower.  You may shave your face/neck.  Please follow these instructions carefully:  1.  Shower with CHG Soap the night before surgery and the  morning of surgery.  2.  If you choose to wash your hair, wash your hair first as usual with your normal  shampoo.  3.  After you shampoo, rinse your hair and body thoroughly to remove the shampoo.                             4.  Use CHG as you would any other liquid soap.  You can apply chg directly to the skin and wash.  Gently  with a scrungie or clean washcloth.  5.  Apply the CHG Soap to your body ONLY FROM THE NECK DOWN.   Do   not use on face/ open                           Wound or open sores. Avoid contact with eyes, ears mouth and   genitals (private parts).                       Wash face,  Genitals (private parts) with your normal soap.             6.  Wash thoroughly, paying special attention to the area where your    surgery  will be performed.  7.  Thoroughly rinse your body with warm water from the neck down.  8.  DO NOT shower/wash with your normal soap after using and rinsing off the CHG Soap.                9.  Pat yourself dry with a clean towel.            10.  Wear clean pajamas.            11.  Place clean sheets on your bed the night of your first shower and do not  sleep with pets. Day of Surgery : Do not apply any lotions/deodorants the morning of surgery.  Please wear clean clothes to the hospital/surgery center.  FAILURE TO FOLLOW THESE INSTRUCTIONS MAY RESULT IN THE CANCELLATION OF YOUR SURGERY  PATIENT SIGNATURE_________________________________  NURSE SIGNATURE__________________________________  ________________________________________________________________________

## 2019-07-01 NOTE — Progress Notes (Addendum)
COVID Vaccine Completed: No Date COVID Vaccine completed: N/A COVID vaccine manufacturer: N/A   PCP - Dr. Thomes Dinning Cardiologist - Dr. Ashok Norris  Chest x-ray - 05/21/2019 in epic EKG - 05/22/2019 in epic Stress Test - greater than 2 year ECHO - 05/22/2019 in epic Cardiac Cath - greater than 2 years  Sleep Study - 11/21/2017 in epic CPAP - No CPAP  Fasting Blood Sugar - below 80 Checks Blood Sugar _4____ times a day  Blood Thinner Instructions: Eliquis last dose 07/02/2019 Aspirin Instructions: N/A Last Dose:N/A  Anesthesia review: Acute hypoxic resp failure 06/03/2019, pneumonia 05/21/2019,OSA, DM, HTN, CKD, CHF  Patient denies shortness of breath, fever, cough and chest pain at PAT appointment   Patient verbalized understanding of instructions that were given to them at the PAT appointment. Patient was also instructed that they will need to review over the PAT instructions again at home before surgery.

## 2019-07-02 ENCOUNTER — Encounter (HOSPITAL_COMMUNITY): Payer: Self-pay

## 2019-07-02 ENCOUNTER — Encounter (HOSPITAL_COMMUNITY)
Admission: RE | Admit: 2019-07-02 | Discharge: 2019-07-02 | Disposition: A | Discharge status: Home / Self Care | Payer: Medicare Other | Source: Ambulatory Visit | Attending: General Surgery | Admitting: General Surgery

## 2019-07-02 ENCOUNTER — Other Ambulatory Visit: Payer: Self-pay

## 2019-07-02 HISTORY — DX: Depression, unspecified: F32.A

## 2019-07-02 HISTORY — DX: Pneumonia, unspecified organism: J18.9

## 2019-07-02 HISTORY — DX: Localized edema: R60.0

## 2019-07-02 HISTORY — DX: Heart failure, unspecified: I50.9

## 2019-07-02 HISTORY — DX: Anxiety disorder, unspecified: F41.9

## 2019-07-02 HISTORY — DX: Retention of urine, unspecified: R33.9

## 2019-07-02 HISTORY — DX: Male erectile dysfunction, unspecified: N52.9

## 2019-07-02 HISTORY — DX: Edema, unspecified: R60.9

## 2019-07-02 NOTE — Progress Notes (Signed)
Requested [employer] from [employer].

## 2019-07-03 ENCOUNTER — Encounter (HOSPITAL_COMMUNITY): Payer: Self-pay | Admitting: Physician Assistant

## 2019-07-03 ENCOUNTER — Other Ambulatory Visit (HOSPITAL_COMMUNITY): Payer: Medicare Other

## 2019-07-03 ENCOUNTER — Encounter (HOSPITAL_COMMUNITY)
Admission: RE | Admit: 2019-07-03 | Discharge: 2019-07-03 | Disposition: A | Discharge status: Home / Self Care | Payer: Medicare Other | Source: Ambulatory Visit | Attending: General Surgery | Admitting: General Surgery

## 2019-07-03 DIAGNOSIS — I5032 Chronic diastolic (congestive) heart failure: Secondary | ICD-10-CM | POA: Diagnosis not present

## 2019-07-03 DIAGNOSIS — Z794 Long term (current) use of insulin: Secondary | ICD-10-CM | POA: Insufficient documentation

## 2019-07-03 DIAGNOSIS — E785 Hyperlipidemia, unspecified: Secondary | ICD-10-CM | POA: Diagnosis not present

## 2019-07-03 DIAGNOSIS — Z7901 Long term (current) use of anticoagulants: Secondary | ICD-10-CM | POA: Diagnosis not present

## 2019-07-03 DIAGNOSIS — Z6841 Body Mass Index (BMI) 40.0 and over, adult: Secondary | ICD-10-CM | POA: Diagnosis not present

## 2019-07-03 DIAGNOSIS — E1122 Type 2 diabetes mellitus with diabetic chronic kidney disease: Secondary | ICD-10-CM | POA: Insufficient documentation

## 2019-07-03 DIAGNOSIS — Z01812 Encounter for preprocedural laboratory examination: Secondary | ICD-10-CM | POA: Diagnosis present

## 2019-07-03 DIAGNOSIS — K219 Gastro-esophageal reflux disease without esophagitis: Secondary | ICD-10-CM | POA: Diagnosis not present

## 2019-07-03 DIAGNOSIS — N183 Chronic kidney disease, stage 3 unspecified: Secondary | ICD-10-CM | POA: Insufficient documentation

## 2019-07-03 DIAGNOSIS — I130 Hypertensive heart and chronic kidney disease with heart failure and stage 1 through stage 4 chronic kidney disease, or unspecified chronic kidney disease: Secondary | ICD-10-CM | POA: Insufficient documentation

## 2019-07-03 DIAGNOSIS — Z79899 Other long term (current) drug therapy: Secondary | ICD-10-CM | POA: Insufficient documentation

## 2019-07-03 DIAGNOSIS — Z86718 Personal history of other venous thrombosis and embolism: Secondary | ICD-10-CM | POA: Diagnosis not present

## 2019-07-03 LAB — BASIC METABOLIC PANEL
Anion gap: 14 (ref 5–15)
BUN: 103 mg/dL — ABNORMAL HIGH (ref 6–20)
CO2: 26 mmol/L (ref 22–32)
Calcium: 10.2 mg/dL (ref 8.9–10.3)
Chloride: 97 mmol/L — ABNORMAL LOW (ref 98–111)
Creatinine, Ser: 4.92 mg/dL — ABNORMAL HIGH (ref 0.61–1.24)
GFR calc Af Amer: 16 mL/min — ABNORMAL LOW (ref >60)
GFR calc non Af Amer: 13 mL/min — ABNORMAL LOW (ref >60)
Glucose, Bld: 58 mg/dL — ABNORMAL LOW (ref 70–99)
Potassium: 4 mmol/L (ref 3.5–5.1)
Sodium: 137 mmol/L (ref 135–145)

## 2019-07-03 LAB — CBC
HCT: 32.6 % — ABNORMAL LOW (ref 39.0–52.0)
Hemoglobin: 10.4 g/dL — ABNORMAL LOW (ref 13.0–17.0)
MCH: 28.5 pg (ref 26.0–34.0)
MCHC: 31.9 g/dL (ref 30.0–36.0)
MCV: 89.3 fL (ref 80.0–100.0)
Platelets: 310 10*3/uL (ref 150–400)
RBC: 3.65 MIL/uL — ABNORMAL LOW (ref 4.22–5.81)
RDW: 14.6 % (ref 11.5–15.5)
WBC: 9 10*3/uL (ref 4.0–10.5)
nRBC: 0 % (ref 0.0–0.2)

## 2019-07-03 LAB — GLUCOSE, CAPILLARY
Glucose-Capillary: 55 mg/dL — ABNORMAL LOW (ref 70–99)
Glucose-Capillary: 57 mg/dL — ABNORMAL LOW (ref 70–99)
Glucose-Capillary: 93 mg/dL (ref 70–99)

## 2019-07-03 LAB — HEMOGLOBIN A1C
Hgb A1c MFr Bld: 6.5 % — ABNORMAL HIGH (ref 4.8–5.6)
Mean Plasma Glucose: 139.85 mg/dL

## 2019-07-04 ENCOUNTER — Other Ambulatory Visit (HOSPITAL_COMMUNITY)
Admission: RE | Admit: 2019-07-04 | Discharge: 2019-07-04 | Disposition: A | Discharge status: Home / Self Care | Payer: Medicare Other | Source: Ambulatory Visit | Attending: General Surgery | Admitting: General Surgery

## 2019-07-04 DIAGNOSIS — Z01812 Encounter for preprocedural laboratory examination: Secondary | ICD-10-CM | POA: Diagnosis present

## 2019-07-04 DIAGNOSIS — Z20822 Contact with and (suspected) exposure to covid-19: Secondary | ICD-10-CM | POA: Diagnosis not present

## 2019-07-04 LAB — SARS CORONAVIRUS 2 (TAT 6-24 HRS): SARS Coronavirus 2: NEGATIVE

## 2019-07-04 NOTE — Progress Notes (Signed)
Anesthesia Chart Review   Case: [id number] Date/Time: 07/07/19 0700   Procedure: LAPAROSCOPIC ROUX-EN-Y GASTRIC BYPASS WITH UPPER ENDOSCOPY (N/A )   Anesthesia type: General   Pre-op diagnosis: MORBID OBESITY   Location: [place] 01 / [facility]   Surgeons: Kinsinger, Arta Bruce, MD      DISCUSSION:44 y.o. never smoker with h/o HTN, DVT/PE (on Eliquis), HLD, DM II, GERD, CKD Stage III, chronic diastolic CHF, OSA, morbid obesity scheduled for above procedure 07/07/2019 with Dr. Gurney Maxin.   Recent hospital admission 5/12-5/16/2021 due to progressive shortness of breath, unclear if due to PNA vs. Pulmonary edema per discharge note.  Symptoms improved with antibiotics as well as diuresis.  05/22/2019 echocardiogram: Left ventricular ejection fraction 60 to 65%.  The left ventricle has normal function.  No regional wall motion abnormalities.  Diastolic parameters are indeterminate.  Right ventricular systolic function normal.  No valvular abnormalities.  Per discharge summary pt to follow up with PCP and cardiology   Hospital follow up with PCP 06/03/2019  Per OV note pt stable, asymptomatic.  Carvedilol and Lasix continued.  Advised to follow up with cardiology.  Pt has not been seen by cardiology since hospital discharge.    Seen by cardiologist, Dr. Fransico Him, prior to hospital admission.  Per 05/08/2019 OV note, "-I will repeat a 2D echo since he is going to have weight loss surgery to make sure that LVF is still normal -he is able to do at least 4.5 mets of activity even with his leg prosthesis without any anginal sx so his revised cardiac risk index for weight loss surgery is high at 11% for periop major cardiac event but not prohibitive and due mainly to morbid obesity, DM, CHF and CKD."    Creatinine 4.92 at PAT visit 07/03/2019.  Per previous OV notes baseline creatinine 2-2.5.  During recent admission creatinine 3.82-4.27, 3.12 at discharge.  Discussed with Dr. Lissa Hoard, recommends nephrology  input before proceeding with elective gastric bypass.  Will discuss with Dr. Kieth Brightly.   VS: BP (!) 151/80    Pulse 76    Temp 36.9 C (Oral)    Resp 18    Ht 6' 2"  (1.88 m)    SpO2 96%    BMI 46.22 kg/m   PROVIDERS: [name], MD is PCP   Fransico Him, MD is Cardiologist  LABS: labs discussed with Dr. Lissa Hoard (all labs ordered are listed, but only abnormal results are displayed)  Labs Reviewed  BASIC METABOLIC PANEL - Abnormal; Notable for the following components:      Result Value   Chloride 97 (*)    Glucose, Bld 58 (*)    BUN 103 (*)    Creatinine, Ser 4.92 (*)    GFR calc non Af Amer 13 (*)    GFR calc Af Amer 16 (*)    All other components within normal limits  CBC - Abnormal; Notable for the following components:   RBC 3.65 (*)    Hemoglobin 10.4 (*)    HCT 32.6 (*)    All other components within normal limits  HEMOGLOBIN A1C - Abnormal; Notable for the following components:   Hgb A1c MFr Bld 6.5 (*)    All other components within normal limits  GLUCOSE, CAPILLARY - Abnormal; Notable for the following components:   Glucose-Capillary 57 (*)    All other components within normal limits  GLUCOSE, CAPILLARY - Abnormal; Notable for the following components:   Glucose-Capillary 55 (*)    All  other components within normal limits  GLUCOSE, CAPILLARY     IMAGES:   EKG: 05/22/2019 Rate 89 bpm Sinus rhythm  Borderline prolonged PR interval Borderline left axis deviation  Nonspecific T abnormalities, lateral leads ST elev, probable normal early repol pattern.   CV: Echo 05/22/2019 IMPRESSIONS   1. Left ventricular ejection fraction, by estimation, is 60 to 65%. The  left ventricle has normal function. The left ventricle has no regional  wall motion abnormalities. Left ventricular diastolic parameters are  indeterminate.  2. Right ventricular systolic function is normal. The right ventricular  size is normal. Tricuspid regurgitation signal is  inadequate for assessing  PA pressure.  3. The mitral valve is normal in structure. Trivial mitral valve  regurgitation. No evidence of mitral stenosis.  4. The aortic valve is tricuspid. Aortic valve regurgitation is not  visualized. No aortic stenosis is present.  5. The inferior vena cava is dilated in size with <50% respiratory  variability, suggesting right atrial pressure of 15 mmHg. Past Medical History:  Diagnosis Date   Acute venous embolism and thrombosis of deep vessels of proximal lower extremity (HCC) 07/19/2011   Anemia    Anginal pain (HCC)    pt denies   Anxiety    Chest pain, neg MI, normal coronaries by cath 02/18/2013   pt denies   CHF (congestive heart failure) (HCC)    CKD (chronic kidney disease) stage 3, GFR 30-59 ml/min 02/19/2013   Colon polyps    Depression    Diabetic ulcer of right foot ([place])    DVT (deep venous thrombosis) ([place]) 09/2002   patient reports additional DVTs in '06 & '11 (unconfirmed)   ED (erectile dysfunction)    GERD (gastroesophageal reflux disease)    History of blood transfusion    "related to OR" (10/31/2016)   Hyperlipidemia 02/19/2013   Hypertension    Nausea & vomiting    "constant for the last couple weeks" (10/31/2016)   Nephrotic syndrome    Obesity    BMI 44, weight 346 pounds 01/30/14   OSA (obstructive sleep apnea) 02/28/2018   Mild obstructive sleep apnea with an AHI of 9.8/h but severe during REM sleep with an AHI of 33.8/h.  Oxygen saturations dropped to 86% and there was moderate snoring   Peripheral edema    Pneumonia    Prosthesis adjustment 08/17/2016   Pulmonary embolism ([place]) 09/2002   treated with 6 months of warfarin   Pyelonephritis 02/02/2018   Type I diabetes mellitus ([place]) dx'd 2001   Urinary retention     Past Surgical History:  Procedure Laterality Date   AMPUTATION Right 12/22/2013   Procedure: AMPUTATION BELOW KNEE;  Surgeon: [name], MD;  Location: [place];  Service:  Orthopedics;  Laterality: Right;   AMPUTATION Right 02/19/2014   Procedure: RIGHT ABOVE KNEE AMPUTATION ;  Surgeon: [name], MD;  Location: [place];  Service: Orthopedics;  Laterality: Right;   blood clot removal  2016   CARDIAC CATHETERIZATION  02/18/2013   normal coronaries   ESOPHAGOGASTRODUODENOSCOPY N/A 11/02/2016   Procedure: ESOPHAGOGASTRODUODENOSCOPY (EGD);  Surgeon: [name], MD;  Location: [facility] ENDOSCOPY;  Service: Endoscopy;  Laterality: N/A;   I & D EXTREMITY Right 12/14/2013   Procedure: IRRIGATION AND DEBRIDEMENT RIGHT FOOT;  Surgeon: [name], MD;  Location: [place];  Service: Orthopedics;  Laterality: Right;   INCISION AND DRAINAGE ABSCESS  2007; 2015   "back"   INCISION AND DRAINAGE OF WOUND Right 12/22/2013   Procedure:  I&D RIGHT BUTTOCK;  Surgeon: [name], MD;  Location: [place];  Service: Orthopedics;  Laterality: Right;   LEFT HEART CATHETERIZATION WITH CORONARY ANGIOGRAM N/A 02/18/2013   Procedure: LEFT HEART CATHETERIZATION WITH CORONARY ANGIOGRAM;  Surgeon: [name], MD;  [facility] CATH LAB;  Service: Cardiovascular;  Laterality: N/A;    MEDICATIONS:  Accu-Chek Softclix Lancets lancets   acidophilus (RISAQUAD) CAPS capsule   allopurinol (ZYLOPRIM) 100 MG tablet   Alpha-Lipoic Acid 600 MG CAPS   amLODipine (NORVASC) 10 MG tablet   apixaban (ELIQUIS) 5 MG TABS tablet   Blood Glucose Monitoring Suppl (ACCU-CHEK GUIDE ME) w/Device KIT   calcitRIOL (ROCALTROL) 0.5 MCG capsule   carvedilol (COREG) 25 MG tablet   cloNIDine (CATAPRES) 0.3 MG tablet   Cyanocobalamin (B-12) 500 MCG SUBL   famotidine (PEPCID) 20 MG tablet   furosemide (LASIX) 80 MG tablet   gabapentin (NEURONTIN) 100 MG capsule   glucose blood (ACCU-CHEK GUIDE) test strip   hydrALAZINE (APRESOLINE) 50 MG tablet   insulin aspart (NOVOLOG) 100 UNIT/ML FlexPen   Insulin Pen Needle (B-D UF III MINI PEN NEEDLES) 31G X 5 MM MISC   Insulin Pen Needle (PEN  NEEDLES 3/16") 31G X 5 MM MISC   Lancet Devices (ACCU-CHEK SOFTCLIX) lancets   lovastatin (MEVACOR) 20 MG tablet   Melatonin 10 MG TABS   metolazone (ZAROXOLYN) 2.5 MG tablet   Omega-3 Fatty Acids (FISH OIL) 1000 MG CAPS   ondansetron (ZOFRAN ODT) 4 MG disintegrating tablet   oxybutynin (DITROPAN) 5 MG tablet   tamsulosin (FLOMAX) 0.4 MG CAPS capsule   TRESIBA FLEXTOUCH 200 UNIT/ML FlexTouch Pen   trimethoprim (TRIMPEX) 100 MG tablet   VICTOZA 18 MG/3ML SOPN   zinc sulfate 220 (50 Zn) MG capsule   No current facility-administered medications for this encounter.    Maia Plan [facility] Pre-Surgical Testing [phone] 07/04/19  3:17 PM

## 2019-07-07 ENCOUNTER — Encounter (HOSPITAL_COMMUNITY): Admission: RE | Payer: Self-pay | Source: Home / Self Care

## 2019-07-07 ENCOUNTER — Inpatient Hospital Stay (HOSPITAL_COMMUNITY): Admission: RE | Admit: 2019-07-07 | Payer: Medicare Other | Source: Home / Self Care | Admitting: General Surgery

## 2019-07-07 ENCOUNTER — Encounter: Payer: Medicare Other | Admitting: Internal Medicine

## 2019-07-07 SURGERY — LAPAROSCOPIC ROUX-EN-Y GASTRIC BYPASS WITH UPPER ENDOSCOPY
Anesthesia: General

## 2019-07-10 ENCOUNTER — Encounter (HOSPITAL_COMMUNITY): Payer: Medicare Other

## 2019-07-15 ENCOUNTER — Other Ambulatory Visit: Payer: Self-pay

## 2019-07-15 ENCOUNTER — Other Ambulatory Visit: Payer: Self-pay | Admitting: Internal Medicine

## 2019-07-15 ENCOUNTER — Ambulatory Visit (HOSPITAL_COMMUNITY)
Admission: RE | Admit: 2019-07-15 | Discharge: 2019-07-15 | Disposition: A | Discharge status: Home / Self Care | Payer: Medicare Other | Source: Ambulatory Visit | Attending: Nephrology | Admitting: Nephrology

## 2019-07-15 VITALS — BP 177/85 | HR 78 | Temp 97.5°F | Resp 18

## 2019-07-15 DIAGNOSIS — D631 Anemia in chronic kidney disease: Secondary | ICD-10-CM | POA: Diagnosis not present

## 2019-07-15 DIAGNOSIS — N183 Chronic kidney disease, stage 3 unspecified: Secondary | ICD-10-CM

## 2019-07-15 LAB — IRON AND TIBC
Iron: 56 ug/dL (ref 45–182)
Saturation Ratios: 18 % (ref 17.9–39.5)
TIBC: 311 ug/dL (ref 250–450)
UIBC: 255 ug/dL

## 2019-07-15 LAB — FERRITIN: Ferritin: 1574 ng/mL — ABNORMAL HIGH (ref 24–336)

## 2019-07-15 MED ORDER — DARBEPOETIN ALFA 100 MCG/0.5ML IJ SOSY: 100.0000 ug | PREFILLED_SYRINGE | INTRAMUSCULAR | Status: DC

## 2019-07-15 MED ORDER — DARBEPOETIN ALFA 100 MCG/0.5ML IJ SOSY
PREFILLED_SYRINGE | INTRAMUSCULAR | Status: AC
  Administered 2019-07-15: 100 ug via SUBCUTANEOUS
  Filled 2019-07-15: qty 0.5

## 2019-07-16 LAB — POCT HEMOGLOBIN-HEMACUE: Hemoglobin: 10.2 g/dL — ABNORMAL LOW (ref 13.0–17.0)

## 2019-07-17 ENCOUNTER — Telehealth: Payer: Self-pay

## 2019-07-17 ENCOUNTER — Other Ambulatory Visit: Payer: Self-pay | Admitting: Endocrinology

## 2019-07-17 LAB — HM DIABETES EYE EXAM

## 2019-07-17 NOTE — Telephone Encounter (Signed)
Spoke to patient advised Dr. Dwyane Dee do not need any labs tomorrow.  Advised lab appointment will be cancelled.

## 2019-07-18 ENCOUNTER — Encounter: Payer: Self-pay | Admitting: *Deleted

## 2019-07-18 ENCOUNTER — Other Ambulatory Visit: Payer: Medicare Other

## 2019-07-21 NOTE — Progress Notes (Deleted)
Patient ID: [name], male   DOB: 10-05-1975, 44 y.o.   MRN: [id number]          Reason for Appointment: Follow-up for Type 2 Diabetes   Today's office visit was provided via telemedicine using video technique Explained to the patient and the the limitations of evaluation and management by telemedicine and the availability of in person appointments.  The patient understood the limitations and agreed to proceed. Patient also understood that the telehealth visit is billable. . Location of the patient: Home . Location of the provider: Office Only the patient and myself were participating in the encounter   History of Present Illness:          Date of diagnosis of type 2 diabetes mellitus:  2001      His weight is about 400 pounds at the time of diagnosis He does not know what his initial blood sugars and circumstances were but he apparently was started on insulin initially He was not able to tolerate metformin because of diarrhea Subsequently has been on insulin only, level of control not available but his A1c was significantly high at 12.8 in 2015  Background history:    Recent history:   INSULIN regimen is:   100 units Tresiba, NovoLog 20-25-30 before meals  Non-insulin hypoglycemic drugs the patient is taking are: Victoza: 1.2 mg daily  His A1c was last 7.8 and last month was 6.9  Current management, blood sugar patterns and problems identified:  After his last visit his Tyler Aas was increased because of persistently high blood sugars in the morning  However his blood sugars have been as high as 325 recently  He thinks that he was having high sugars when he was not able to exercise because of ankle sprain  Most of the time he is checking blood sugars only in the mornings and sometimes late morning or around lunchtime but not after meals as directed  He still has not lost weight  Currently he has taken somewhat more insulin at suppertime for his mealtime coverage but  not adjusting it based on what he is eating  Also no hypoglycemia  He continues to take Victoza 1.2 mg and he thinks that he is not having any side effects with this.  Also not missing any doses  He is scheduled to see the dietitian in preparation for possible gastric bypass surgery  Despite cutting back on the exercise regimen in the last week or 2 he has still readings as high as 204 recently  Checking blood sugars intermittently   Side effects from medications have been: Diarrhea with metformin, vomiting with Ozempic  Compliance with the medical regimen: Improving   Glucose monitoring:  done at least 1 a day         Glucometer:   ACCU-CHEK [employer]  Blood sugar readings from patient meter related by patient with readings since 10/11/2018   PRE-MEAL Fasting Lunch Dinner Bedtime Overall  Glucose range:  161-222  104-278 ?    Mean/median:     ?   POST-MEAL PC Breakfast PC Lunch PC Dinner  Glucose range:  176-325  300   Mean/median:      Previous readings:   PRE-MEAL Fasting Lunch Dinner Bedtime Overall  Glucose range:  82-210  104-285 ?    Mean/median:         POST-MEAL PC Breakfast PC Lunch PC Dinner  Glucose range:    122-238  Mean/median:         Self-care:  Typical meal intake: Breakfast is egg whites, some oatmeal/grits and banana.  Usually small portions of meat or other protein at lunch and dinner and only a small amount of sweet potato at lunch or carbohydrate              Dietician visit, most recent: Unknown               Exercise:  Previously was doing 2 sessions of 1 hour each at the gym with weights, resistance and some cardio,, recently less frequent and less intensive    Weight history:  Wt Readings from Last 3 Encounters:  07/02/19 (!) 360 lb (163.3 kg)  06/03/19 (!) 366 lb 3.2 oz (166.1 kg)  05/25/19 (!) 374 lb 1.9 oz (169.7 kg)    Glycemic control:   Lab Results  Component Value Date   HGBA1C 6.5 (H) 07/03/2019   HGBA1C 7.3 (A)  04/18/2019   HGBA1C 7.1 (A) 01/01/2019   Lab Results  Component Value Date   MICROALBUR 160.61 (H) 08/14/2013   LDLCALC 64 11/14/2018   CREATININE 4.92 (H) 07/03/2019   Lab Results  Component Value Date   MICRALBCREAT 1,528.4 (H) 02/15/2017    Lab Results  Component Value Date   FRUCTOSAMINE 263 12/04/2017      Allergies as of 07/22/2019      Reactions   Reglan [metoclopramide] Other (See Comments)   Dysphoric reaction      Medication List       Accurate as of July 21, 2019  9:28 PM. If you have any questions, ask your nurse or doctor.        Accu-Chek Guide Me w/Device Kit Use to check blood sugar up to 3 times a day   Accu-Chek Guide test strip Generic drug: glucose blood Check blood sugar up to 3 times a day   accu-chek softclix lancets Use to check blood sugars 5 times a day. Dx code:E11.9. Insulin dependent.   Accu-Chek Softclix Lancets lancets Check blood sugar up to 3 times a day   acidophilus Caps capsule Take 1 capsule by mouth daily.   allopurinol 100 MG tablet Commonly known as: ZYLOPRIM Take 2 tablets (200 mg total) by mouth daily.   Alpha-Lipoic Acid 600 MG Caps Take 600 mg by mouth daily.   amLODipine 10 MG tablet Commonly known as: NORVASC Take 10 mg by mouth at bedtime.   apixaban 5 MG Tabs tablet Commonly known as: Eliquis Take 1 tablet (5 mg total) by mouth 2 (two) times daily.   B-12 500 MCG Subl Place 500 mcg under the tongue daily.   calcitRIOL 0.5 MCG capsule Commonly known as: ROCALTROL Take 0.5 mcg by mouth daily.   carvedilol 25 MG tablet Commonly known as: COREG Take 1 tablet (25 mg total) by mouth 2 (two) times daily with a meal.   cloNIDine 0.3 MG tablet Commonly known as: CATAPRES Take 1 tablet (0.3 mg total) by mouth 2 (two) times daily.   famotidine 20 MG tablet Commonly known as: PEPCID Take 1 tablet (20 mg total) by mouth 2 (two) times daily. What changed:   when to take this  reasons to take this    Fish Oil 1000 MG Caps Take 1,000 mg by mouth at bedtime.   furosemide 80 MG tablet Commonly known as: LASIX Take 1 tablet (80 mg total) by mouth 2 (two) times daily.   gabapentin 100 MG capsule Commonly known as: NEURONTIN Take 1 capsule (100 mg total) by mouth 3 (three)  times daily.   hydrALAZINE 50 MG tablet Commonly known as: APRESOLINE Take 1 tablet by mouth 4 times daily   insulin aspart 100 UNIT/ML FlexPen Commonly known as: NOVOLOG Inject 25 Units into the skin 3 (three) times daily with meals. 20 unit morning, 25 units lunch 30 units for dinner What changed:   how much to take  when to take this  additional instructions   lovastatin 20 MG tablet Commonly known as: MEVACOR Take 1 tablet (20 mg total) by mouth daily. What changed: when to take this   Melatonin 10 MG Tabs Take 10 mg by mouth at bedtime as needed (sleep).   metolazone 2.5 MG tablet Commonly known as: ZAROXOLYN Take 2.5 mg by mouth every other day.   ondansetron 4 MG disintegrating tablet Commonly known as: Zofran ODT Take 1 tablet (4 mg total) by mouth every 8 (eight) hours as needed for nausea or vomiting.   oxybutynin 5 MG tablet Commonly known as: DITROPAN Take 5 mg by mouth 3 (three) times daily.   Pen Needles 3/16" 31G X 5 MM Misc Use five times daily   B-D UF III MINI PEN NEEDLES 31G X 5 MM Misc Generic drug: Insulin Pen Needle USE  4 TIMES DAILY   tamsulosin 0.4 MG Caps capsule Commonly known as: FLOMAX Take 0.4 mg by mouth at bedtime.   Tyler Aas FlexTouch 200 UNIT/ML FlexTouch Pen Generic drug: insulin degludec INJECT 70 UNITS SUBCUTANEOUSLY ONCE DAILY What changed: See the new instructions.   trimethoprim 100 MG tablet Commonly known as: TRIMPEX Take 100 mg by mouth at bedtime.   Victoza 18 MG/3ML Sopn Generic drug: liraglutide INJECT 1.2 MG (0.2 MLS) INTO THE SKIN ONCE DAILY AT THE SAME TIME. What changed: See the new instructions.   zinc sulfate 220 (50 Zn) MG  capsule Take 220 mg by mouth daily.       Allergies:  Allergies  Allergen Reactions  . Reglan [Metoclopramide] Other (See Comments)    Dysphoric reaction    Past Medical History:  Diagnosis Date  . Acute venous embolism and thrombosis of deep vessels of proximal lower extremity ([place]) 07/19/2011  . Anemia   . Anginal pain ([place])    pt denies  . Anxiety   . Chest pain, neg MI, normal coronaries by cath 02/18/2013   pt denies  . CHF (congestive heart failure) ([place])   . CKD (chronic kidney disease) stage 3, GFR 30-59 ml/min 02/19/2013  . Colon polyps   . Depression   . Diabetic ulcer of right foot ([place])   . DVT (deep venous thrombosis) ([place]) 09/2002   patient reports additional DVTs in '06 & '11 (unconfirmed)  . ED (erectile dysfunction)   . GERD (gastroesophageal reflux disease)   . History of blood transfusion    "related to OR" (10/31/2016)  . Hyperlipidemia 02/19/2013  . Hypertension   . Nausea & vomiting    "constant for the last couple weeks" (10/31/2016)  . Nephrotic syndrome   . Obesity    BMI 44, weight 346 pounds 01/30/14  . OSA (obstructive sleep apnea) 02/28/2018   Mild obstructive sleep apnea with an AHI of 9.8/h but severe during REM sleep with an AHI of 33.8/h.  Oxygen saturations dropped to 86% and there was moderate snoring  . Peripheral edema   . Pneumonia   . Prosthesis adjustment 08/17/2016  . Pulmonary embolism ([place]) 09/2002   treated with 6 months of warfarin  . Pyelonephritis 02/02/2018  . Type I diabetes mellitus (  [place]) dx'd 2001  . Urinary retention     Past Surgical History:  Procedure Laterality Date  . AMPUTATION Right 12/22/2013   Procedure: AMPUTATION BELOW KNEE;  Surgeon: [name], MD;  Location: [facility];  Service: Orthopedics;  Laterality: Right;  . AMPUTATION Right 02/19/2014   Procedure: RIGHT ABOVE KNEE AMPUTATION ;  Surgeon: [name], MD;  Location: [facility];  Service: Orthopedics;  Laterality: Right;  . blood clot removal  2016  .  CARDIAC CATHETERIZATION  02/18/2013   normal coronaries  . ESOPHAGOGASTRODUODENOSCOPY N/A 11/02/2016   Procedure: ESOPHAGOGASTRODUODENOSCOPY (EGD);  Surgeon: [name], MD;  Location[facility] ENDOSCOPY;  Service: Endoscopy;  Laterality: N/A;  . I & D EXTREMITY Right 12/14/2013   Procedure: IRRIGATION AND DEBRIDEMENT RIGHT FOOT;  Surgeon: [name], MD;  Location: [facility];  Service: Orthopedics;  Laterality: Right;  . INCISION AND DRAINAGE ABSCESS  2007; 2015   "back"  . INCISION AND DRAINAGE OF WOUND Right 12/22/2013   Procedure: I&D RIGHT BUTTOCK;  Surgeon: [name], MD;  Location: [facility];  Service: Orthopedics;  Laterality: Right;  . LEFT HEART CATHETERIZATION WITH CORONARY ANGIOGRAM N/A 02/18/2013   Procedure: LEFT HEART CATHETERIZATION WITH CORONARY ANGIOGRAM;  Surgeon: [name], MD;  Location: [facility] CATH LAB;  Service: Cardiovascular;  Laterality: N/A;    Family History  Problem Relation Age of Onset  . Heart disease Mother   . Diabetes Mother   . Diabetes Maternal Uncle   . Diabetes Cousin     Social History:  reports that he has never smoked. He has never used smokeless tobacco. He reports current alcohol use. He reports that he does not use drugs.   Review of Systems    Lipid history: Has been on lovastatin with control as follows: Prescribed by PCP   Lab Results  Component Value Date   CHOL 122 11/14/2018   HDL 34 (L) 11/14/2018   LDLCALC 64 11/14/2018   TRIG 135 11/14/2018   CHOLHDL 3.6 11/14/2018           Hypertension: He is taking clonidine, Norvasc and Coreg followed by nephrologist  BP Readings from Last 3 Encounters:  07/15/19 (!) 177/85  07/03/19 (!) 151/80  06/16/19 (!) 163/80     Most recent eye exam was in 08/2017  Most recent foot exam: 9/23   Complications of diabetes: Nephropathy with renal insufficiency and proteinuria, unknown status of retinopathy, erectile dysfunction  Followed by nephrologist for CKD Also has anemia  Lab  Results  Component Value Date   CREATININE 4.92 (H) 07/03/2019   CREATININE 3.12 (H) 06/03/2019   CREATININE 3.93 (H) 05/25/2019     LABS:  Scanned Document on 07/18/2019  Component Date Value Ref Range Status  . HM Diabetic Eye Exam 07/17/2019 Retinopathy* No Retinopathy Final   seen by A Lundquist, PA, [facility]    Physical Examination:  There were no vitals taken for this visit.      ASSESSMENT:  Diabetes type 2, with morbid obesity  See history of present illness for detailed discussion of current diabetes management, blood sugar patterns and problems identified  His last A1c is more recently 6.9, previously 7.8  Blood sugars have been overall poorly controlled This is likely to be from inconsistent diet, and activity especially recently Even though he is trying to exercise again his fasting readings are persistently high Not clear what his postprandial readings are  Discussed his day-to-day problems with blood sugar control and how to  improve Discussed blood sugar monitoring and blood sugar targets at various times  PLAN:   He will continue Victoza 1.2 mg daily No change in NovoLog unless his blood sugars are higher after dinner Discussed adjusting NovoLog based on meal size and carbohydrate Increase Tresiba up to 110 If he is getting relatively low readings after breakfast he can reduce his NovoLog to 15 in the morning Make sure he takes his NovoLog before starting to eat He will review his diet with the dietitian at the upcoming visit  Will review his management in about [redacted] weeks along with labs  There are no Patient Instructions on file for this visit.    Counseling time on subjects discussed in assessment and plan sections is over 50% of today's 25 minute visit  [name] 07/21/2019, 9:28 PM   Note: This office note was prepared with Dragon voice recognition system technology. Any transcriptional errors that result from this process are  unintentional.

## 2019-07-22 ENCOUNTER — Ambulatory Visit: Payer: Medicare Other | Admitting: Endocrinology

## 2019-07-24 ENCOUNTER — Ambulatory Visit (INDEPENDENT_AMBULATORY_CARE_PROVIDER_SITE_OTHER): Payer: Medicare Other | Admitting: Endocrinology

## 2019-07-24 ENCOUNTER — Encounter: Payer: Self-pay | Admitting: Endocrinology

## 2019-07-24 ENCOUNTER — Other Ambulatory Visit: Payer: Self-pay

## 2019-07-24 VITALS — BP 132/72 | HR 76 | Ht 74.0 in | Wt 360.0 lb

## 2019-07-24 DIAGNOSIS — Z794 Long term (current) use of insulin: Secondary | ICD-10-CM

## 2019-07-24 DIAGNOSIS — E1165 Type 2 diabetes mellitus with hyperglycemia: Secondary | ICD-10-CM | POA: Diagnosis not present

## 2019-07-24 DIAGNOSIS — E1169 Type 2 diabetes mellitus with other specified complication: Secondary | ICD-10-CM

## 2019-07-24 DIAGNOSIS — E669 Obesity, unspecified: Secondary | ICD-10-CM | POA: Diagnosis not present

## 2019-07-24 LAB — GLUCOSE, POCT (MANUAL RESULT ENTRY): POC Glucose: 163 mg/dl — AB (ref 70–99)

## 2019-07-24 NOTE — Progress Notes (Signed)
Patient ID: [name], male   DOB: 25-Feb-1975, 44 y.o.   MRN: [id number]          Reason for Appointment: Follow-up for Type 2 Diabetes    History of Present Illness:          Date of diagnosis of type 2 diabetes mellitus:  2001      His weight is about 400 pounds at the time of diagnosis He does not know what his initial blood sugars and circumstances were but he apparently was started on insulin initially He was not able to tolerate metformin because of diarrhea Subsequently has been on insulin only, level of control not available but his A1c was significantly high at 12.8 in 2015  Background history:    Recent history:   INSULIN regimen is:   90 units Tresiba once daily, NovoLog 15 breakfast 20 -25 units before lunch and dinner   Non-insulin hypoglycemic drugs the patient is taking are: None, was on Victoza: 1.2 mg daily   Current management, blood sugar patterns and problems identified:  He has not come back for follow-up since 10/20  His A1c in June was 6.5, previously 7.3   He did not bring his monitor for download  He has reduced his [place] on his own even though his fasting readings are appearing to be higher  Also he thinks he has periodic readings up to 250 after lunch or dinner based on his diet  However he has continued to take some NovoLog about 15 units in the mornings at breakfast even though he says he will frequently get low sugars after exercising with readings as low as 50  He has run out of his Victoza and he did not call for refill  As before has leveled off his weight and not losing any despite exercising 6 days a week  He says he is doing both aerobic and weight exercises and is able to walk on the treadmill with prosthesis  He will have a high carbohydrate breakfast in the morning before exercising and after coming back he will have a protein meal with eggs and chicken   Side effects from medications have been: Diarrhea with metformin,  vomiting with Ozempic  Glucose monitoring:  done at least 1 a day         Glucometer:   ACCU-CHEK [employer]  Blood sugars by recall:  PRE-MEAL Fasting Lunch Dinner Bedtime Overall  Glucose range:  140-172      Mean/median:        POST-MEAL PC Breakfast PC Lunch PC Dinner  Glucose range:    140-250  Mean/median:       Previous blood sugars:  PRE-MEAL Fasting Lunch Dinner Bedtime Overall  Glucose range:  161-222  104-278 ?    Mean/median:     ?   POST-MEAL PC Breakfast PC Lunch PC Dinner  Glucose range:  176-325  300   Mean/median:         Self-care:    Typical meal intake: Breakfast is egg whites, some oatmeal/grits and banana.  Usually small portions of meat or other protein at lunch and dinner and only a small amount of sweet potato at lunch or carbohydrate              Dietician visit, most recent: Unknown               Weight history:  Wt Readings from Last 3 Encounters:  07/24/19 (!) 360 lb (163.3 kg)  07/02/19 Marland Kitchen)  360 lb (163.3 kg)  06/03/19 (!) 366 lb 3.2 oz (166.1 kg)    Glycemic control:   Lab Results  Component Value Date   HGBA1C 6.5 (H) 07/03/2019   HGBA1C 7.3 (A) 04/18/2019   HGBA1C 7.1 (A) 01/01/2019   Lab Results  Component Value Date   MICROALBUR 160.61 (H) 08/14/2013   LDLCALC 64 11/14/2018   CREATININE 4.92 (H) 07/03/2019   Lab Results  Component Value Date   MICRALBCREAT 1,528.4 (H) 02/15/2017    Lab Results  Component Value Date   FRUCTOSAMINE 263 12/04/2017      Allergies as of 07/24/2019      Reactions   Reglan [metoclopramide] Other (See Comments)   Dysphoric reaction      Medication List       Accurate as of July 24, 2019  2:47 PM. If you have any questions, ask your nurse or doctor.        Accu-Chek Guide Me w/Device Kit Use to check blood sugar up to 3 times a day   Accu-Chek Guide test strip Generic drug: glucose blood Check blood sugar up to 3 times a day   accu-chek softclix lancets Use to check blood  sugars 5 times a day. Dx code:E11.9. Insulin dependent.   Accu-Chek Softclix Lancets lancets Check blood sugar up to 3 times a day   acidophilus Caps capsule Take 1 capsule by mouth daily.   allopurinol 100 MG tablet Commonly known as: ZYLOPRIM Take 2 tablets (200 mg total) by mouth daily.   Alpha-Lipoic Acid 600 MG Caps Take 600 mg by mouth daily.   amLODipine 10 MG tablet Commonly known as: NORVASC Take 10 mg by mouth at bedtime.   apixaban 5 MG Tabs tablet Commonly known as: Eliquis Take 1 tablet (5 mg total) by mouth 2 (two) times daily.   B-12 500 MCG Subl Place 500 mcg under the tongue daily.   calcitRIOL 0.5 MCG capsule Commonly known as: ROCALTROL Take 0.5 mcg by mouth daily.   carvedilol 25 MG tablet Commonly known as: COREG Take 1 tablet (25 mg total) by mouth 2 (two) times daily with a meal.   cloNIDine 0.3 MG tablet Commonly known as: CATAPRES Take 1 tablet (0.3 mg total) by mouth 2 (two) times daily.   famotidine 20 MG tablet Commonly known as: PEPCID Take 1 tablet (20 mg total) by mouth 2 (two) times daily. What changed:   when to take this  reasons to take this   Fish Oil 1000 MG Caps Take 1,000 mg by mouth at bedtime.   furosemide 80 MG tablet Commonly known as: LASIX Take 1 tablet (80 mg total) by mouth 2 (two) times daily.   gabapentin 100 MG capsule Commonly known as: NEURONTIN Take 1 capsule (100 mg total) by mouth 3 (three) times daily.   hydrALAZINE 50 MG tablet Commonly known as: APRESOLINE Take 1 tablet by mouth 4 times daily   insulin aspart 100 UNIT/ML FlexPen Commonly known as: NOVOLOG Inject 25 Units into the skin 3 (three) times daily with meals. 20 unit morning, 25 units lunch 30 units for dinner What changed:   how much to take  when to take this  additional instructions   lovastatin 20 MG tablet Commonly known as: MEVACOR Take 1 tablet (20 mg total) by mouth daily. What changed: when to take this   Melatonin  10 MG Tabs Take 10 mg by mouth at bedtime as needed (sleep).   metolazone 2.5 MG tablet Commonly known  as: ZAROXOLYN Take 2.5 mg by mouth every other day.   ondansetron 4 MG disintegrating tablet Commonly known as: Zofran ODT Take 1 tablet (4 mg total) by mouth every 8 (eight) hours as needed for nausea or vomiting.   oxybutynin 5 MG tablet Commonly known as: DITROPAN Take 5 mg by mouth 3 (three) times daily.   Pen Needles 3/16" 31G X 5 MM Misc Use five times daily   B-D UF III MINI PEN NEEDLES 31G X 5 MM Misc Generic drug: Insulin Pen Needle USE  4 TIMES DAILY   tamsulosin 0.4 MG Caps capsule Commonly known as: FLOMAX Take 0.4 mg by mouth at bedtime.   Tyler Aas FlexTouch 200 UNIT/ML FlexTouch Pen Generic drug: insulin degludec INJECT 70 UNITS SUBCUTANEOUSLY ONCE DAILY What changed: See the new instructions.   trimethoprim 100 MG tablet Commonly known as: TRIMPEX Take 100 mg by mouth at bedtime.   Victoza 18 MG/3ML Sopn Generic drug: liraglutide INJECT 1.2 MG (0.2 MLS) INTO THE SKIN ONCE DAILY AT THE SAME TIME.   zinc sulfate 220 (50 Zn) MG capsule Take 220 mg by mouth daily.       Allergies:  Allergies  Allergen Reactions  . Reglan [Metoclopramide] Other (See Comments)    Dysphoric reaction    Past Medical History:  Diagnosis Date  . Acute venous embolism and thrombosis of deep vessels of proximal lower extremity ([place]) 07/19/2011  . Anemia   . Anginal pain ([place])    pt denies  . Anxiety   . Chest pain, neg MI, normal coronaries by cath 02/18/2013   pt denies  . CHF (congestive heart failure) ([place])   . CKD (chronic kidney disease) stage 3, GFR 30-59 ml/min 02/19/2013  . Colon polyps   . Depression   . Diabetic ulcer of right foot ([place])   . DVT (deep venous thrombosis) ([place]) 09/2002   patient reports additional DVTs in '06 & '11 (unconfirmed)  . ED (erectile dysfunction)   . GERD (gastroesophageal reflux disease)   . History of blood transfusion     "related to OR" (10/31/2016)  . Hyperlipidemia 02/19/2013  . Hypertension   . Nausea & vomiting    "constant for the last couple weeks" (10/31/2016)  . Nephrotic syndrome   . Obesity    BMI 44, weight 346 pounds 01/30/14  . OSA (obstructive sleep apnea) 02/28/2018   Mild obstructive sleep apnea with an AHI of 9.8/h but severe during REM sleep with an AHI of 33.8/h.  Oxygen saturations dropped to 86% and there was moderate snoring  . Peripheral edema   . Pneumonia   . Prosthesis adjustment 08/17/2016  . Pulmonary embolism ([place]) 09/2002   treated with 6 months of warfarin  . Pyelonephritis 02/02/2018  . Type I diabetes mellitus ([place]) dx'd 2001  . Urinary retention     Past Surgical History:  Procedure Laterality Date  . AMPUTATION Right 12/22/2013   Procedure: AMPUTATION BELOW KNEE;  Surgeon: [name], MD;  Location: [facility];  Service: Orthopedics;  Laterality: Right;  . AMPUTATION Right 02/19/2014   Procedure: RIGHT ABOVE KNEE AMPUTATION ;  Surgeon: [name], MD;  Location: [facility] [facility];  Service: Orthopedics;  Laterality: Right;  . blood clot removal  2016  . CARDIAC CATHETERIZATION  02/18/2013   normal coronaries  . ESOPHAGOGASTRODUODENOSCOPY N/A 11/02/2016   Procedure: ESOPHAGOGASTRODUODENOSCOPY (EGD);  Surgeon: [name], MD;  Location[facility] ENDOSCOPY;  Service: Endoscopy;  Laterality: N/A;  . I & D EXTREMITY Right 12/14/2013  Procedure: IRRIGATION AND DEBRIDEMENT RIGHT FOOT;  Surgeon: [name], MD;  Location: [place];  Service: Orthopedics;  Laterality: Right;  . INCISION AND DRAINAGE ABSCESS  2007; 2015   "back"  . INCISION AND DRAINAGE OF WOUND Right 12/22/2013   Procedure: I&D RIGHT BUTTOCK;  Surgeon: [name], MD;  Location: [place];  Service: Orthopedics;  Laterality: Right;  . LEFT HEART CATHETERIZATION WITH CORONARY ANGIOGRAM N/A 02/18/2013   Procedure: LEFT HEART CATHETERIZATION WITH CORONARY ANGIOGRAM;  Surgeon: [name], MD;  Location: [facility] CATH LAB;  Service:  Cardiovascular;  Laterality: N/A;    Family History  Problem Relation Age of Onset  . Heart disease Mother   . Diabetes Mother   . Diabetes Maternal Uncle   . Diabetes Cousin     Social History:  reports that he has never smoked. He has never used smokeless tobacco. He reports current alcohol use. He reports that he does not use drugs.   Review of Systems    Lipid history: Has been on lovastatin with labs as follows: Prescribed by PCP   Lab Results  Component Value Date   CHOL 122 11/14/2018   HDL 34 (L) 11/14/2018   LDLCALC 64 11/14/2018   TRIG 135 11/14/2018   CHOLHDL 3.6 11/14/2018           Hypertension: He is taking clonidine, Norvasc, hydralazine and Coreg followed by nephrologist  BP Readings from Last 3 Encounters:  07/24/19 132/72  07/15/19 (!) 177/85  07/03/19 (!) 151/80     Most recent eye exam was in 57/8469   Complications of diabetes: Nephropathy with renal insufficiency and proteinuria, unknown status of retinopathy, erectile dysfunction  Followed by nephrologist for CKD Also has anemia  Lab Results  Component Value Date   CREATININE 4.92 (H) 07/03/2019   CREATININE 3.12 (H) 06/03/2019   CREATININE 3.93 (H) 05/25/2019    He has not received his Covid vaccine and discussed the need to do this, explained to him that he is at very high risk and answered all questions regarding efficacy and safety.  Discussed that there is no contraindication with his medical history, allergies and current medications Offered to have him get this at home since he has had a history of amputation but he thinks he can do it on his own Given him contact information on where to schedule through [facility] health or at any pharmacy   LABS:  Scanned Document on 07/18/2019  Component Date Value Ref Range Status  . HM Diabetic Eye Exam 07/17/2019 Retinopathy* No Retinopathy Final   seen by A Lundquist, PA, [facility]    Physical Examination:  BP 132/72 (BP Location:  Left Arm, Patient Position: Sitting, Cuff Size: Large)   Pulse 76   Ht 6' 2"  (1.88 m)   Wt (!) 360 lb (163.3 kg) Comment: per patient  SpO2 96%   BMI 46.22 kg/m       ASSESSMENT:  Diabetes type 2, with morbid obesity  See history of present illness for detailed discussion of current diabetes management, blood sugar patterns and problems identified  His last A1c is recently 6.5  Difficult to assess his blood sugars as he did not bring blood sugar meter Recently appears that he has mostly high fasting readings and some high readings after meals except after breakfast Also tending to have low sugars with exercise in the morning after breakfast He thinks his diet is usually fairly good except when he is getting more carbs in the  evenings causing the sugars to be higher Again has difficulty losing weight and still waiting for his bariatric surgery He has seen diabetes educator in March However continues to be insulin resistant with relatively high fasting readings reportedly  Also not taking Victoza which may be causing some higher readings especially postprandially  PLAN:   He will resume Victoza 1.2 mg daily, to start with 0.6 mg for the first week. He needs to increase his Tresiba at least 6 units to get his morning sugars down below 120 Also adjust his meal time insulin up to 30 units if eating more carbohydrates Stop taking NovoLog at breakfast when going for exercise Need to make sure he brings his monitor for download on each visit However hopefully if he has a bariatric surgery he will be able to get off insulin  Follow-up in 6 weeks   There are no Patient Instructions on file for this visit.     [name] 07/24/2019, 2:47 PM   Note: This office note was prepared with Dragon voice recognition system technology. Any transcriptional errors that result from this process are unintentional.

## 2019-07-24 NOTE — Patient Instructions (Addendum)
Take 96 Tresiba, keep am sugar 120  No Novolog before exercise in am  Victoza 0.6 for 1 week then 1.2mg   Check blood sugars on waking up days a week  Also check blood sugars about 2 hours after meals and do this after different meals by rotation  Recommended blood sugar levels on waking up are 90-130 and about 2 hours after meal is 130-180  Please bring your blood sugar monitor to each visit, thank you

## 2019-07-25 DIAGNOSIS — E1122 Type 2 diabetes mellitus with diabetic chronic kidney disease: Secondary | ICD-10-CM | POA: Diagnosis not present

## 2019-07-25 MED ORDER — VICTOZA 18 MG/3ML ~~LOC~~ SOPN: PEN_INJECTOR | SUBCUTANEOUS | 0 refills | Status: DC

## 2019-07-29 ENCOUNTER — Other Ambulatory Visit: Payer: Self-pay

## 2019-07-29 ENCOUNTER — Inpatient Hospital Stay (HOSPITAL_COMMUNITY)
Admission: EM | Admit: 2019-07-29 | Discharge: 2019-07-31 | DRG: 683 | Disposition: A | Discharge status: Home / Self Care | Payer: Medicare Other | Attending: Internal Medicine | Admitting: Internal Medicine

## 2019-07-29 DIAGNOSIS — I132 Hypertensive heart and chronic kidney disease with heart failure and with stage 5 chronic kidney disease, or end stage renal disease: Secondary | ICD-10-CM | POA: Diagnosis not present

## 2019-07-29 DIAGNOSIS — G4733 Obstructive sleep apnea (adult) (pediatric): Secondary | ICD-10-CM | POA: Diagnosis present

## 2019-07-29 DIAGNOSIS — Z86711 Personal history of pulmonary embolism: Secondary | ICD-10-CM

## 2019-07-29 DIAGNOSIS — K219 Gastro-esophageal reflux disease without esophagitis: Secondary | ICD-10-CM | POA: Diagnosis present

## 2019-07-29 DIAGNOSIS — I5032 Chronic diastolic (congestive) heart failure: Secondary | ICD-10-CM | POA: Diagnosis not present

## 2019-07-29 DIAGNOSIS — R609 Edema, unspecified: Secondary | ICD-10-CM

## 2019-07-29 DIAGNOSIS — N170 Acute kidney failure with tubular necrosis: Secondary | ICD-10-CM | POA: Diagnosis not present

## 2019-07-29 DIAGNOSIS — D631 Anemia in chronic kidney disease: Secondary | ICD-10-CM | POA: Diagnosis not present

## 2019-07-29 DIAGNOSIS — Z794 Long term (current) use of insulin: Secondary | ICD-10-CM

## 2019-07-29 DIAGNOSIS — Z89611 Acquired absence of right leg above knee: Secondary | ICD-10-CM

## 2019-07-29 DIAGNOSIS — E1122 Type 2 diabetes mellitus with diabetic chronic kidney disease: Secondary | ICD-10-CM | POA: Diagnosis present

## 2019-07-29 DIAGNOSIS — Z86718 Personal history of other venous thrombosis and embolism: Secondary | ICD-10-CM

## 2019-07-29 DIAGNOSIS — E8770 Fluid overload, unspecified: Secondary | ICD-10-CM

## 2019-07-29 DIAGNOSIS — I517 Cardiomegaly: Secondary | ICD-10-CM | POA: Diagnosis not present

## 2019-07-29 DIAGNOSIS — N185 Chronic kidney disease, stage 5: Secondary | ICD-10-CM

## 2019-07-29 DIAGNOSIS — J811 Chronic pulmonary edema: Secondary | ICD-10-CM | POA: Diagnosis not present

## 2019-07-29 DIAGNOSIS — E11649 Type 2 diabetes mellitus with hypoglycemia without coma: Secondary | ICD-10-CM | POA: Diagnosis present

## 2019-07-29 DIAGNOSIS — R600 Localized edema: Secondary | ICD-10-CM

## 2019-07-29 DIAGNOSIS — Z888 Allergy status to other drugs, medicaments and biological substances status: Secondary | ICD-10-CM

## 2019-07-29 DIAGNOSIS — N390 Urinary tract infection, site not specified: Secondary | ICD-10-CM | POA: Diagnosis not present

## 2019-07-29 DIAGNOSIS — N184 Chronic kidney disease, stage 4 (severe): Secondary | ICD-10-CM

## 2019-07-29 DIAGNOSIS — I444 Left anterior fascicular block: Secondary | ICD-10-CM | POA: Diagnosis present

## 2019-07-29 DIAGNOSIS — N289 Disorder of kidney and ureter, unspecified: Secondary | ICD-10-CM

## 2019-07-29 DIAGNOSIS — N189 Chronic kidney disease, unspecified: Secondary | ICD-10-CM

## 2019-07-29 DIAGNOSIS — Z8249 Family history of ischemic heart disease and other diseases of the circulatory system: Secondary | ICD-10-CM

## 2019-07-29 DIAGNOSIS — Z6841 Body Mass Index (BMI) 40.0 and over, adult: Secondary | ICD-10-CM

## 2019-07-29 DIAGNOSIS — E162 Hypoglycemia, unspecified: Secondary | ICD-10-CM

## 2019-07-29 DIAGNOSIS — E785 Hyperlipidemia, unspecified: Secondary | ICD-10-CM | POA: Diagnosis present

## 2019-07-29 DIAGNOSIS — Z20822 Contact with and (suspected) exposure to covid-19: Secondary | ICD-10-CM | POA: Diagnosis present

## 2019-07-29 DIAGNOSIS — I129 Hypertensive chronic kidney disease with stage 1 through stage 4 chronic kidney disease, or unspecified chronic kidney disease: Secondary | ICD-10-CM | POA: Diagnosis not present

## 2019-07-29 DIAGNOSIS — Z7901 Long term (current) use of anticoagulants: Secondary | ICD-10-CM

## 2019-07-29 DIAGNOSIS — Z833 Family history of diabetes mellitus: Secondary | ICD-10-CM

## 2019-07-29 LAB — BASIC METABOLIC PANEL
Anion gap: 11 (ref 5–15)
BUN: 113 mg/dL — ABNORMAL HIGH (ref 6–20)
CO2: 25 mmol/L (ref 22–32)
Calcium: 9.3 mg/dL (ref 8.9–10.3)
Chloride: 98 mmol/L (ref 98–111)
Creatinine, Ser: 5.16 mg/dL — ABNORMAL HIGH (ref 0.61–1.24)
GFR calc Af Amer: 15 mL/min — ABNORMAL LOW (ref >60)
GFR calc non Af Amer: 13 mL/min — ABNORMAL LOW (ref >60)
Glucose, Bld: 212 mg/dL — ABNORMAL HIGH (ref 70–99)
Potassium: 3.6 mmol/L (ref 3.5–5.1)
Sodium: 134 mmol/L — ABNORMAL LOW (ref 135–145)

## 2019-07-29 LAB — CBC
HCT: 31.1 % — ABNORMAL LOW (ref 39.0–52.0)
Hemoglobin: 9.8 g/dL — ABNORMAL LOW (ref 13.0–17.0)
MCH: 28.7 pg (ref 26.0–34.0)
MCHC: 31.5 g/dL (ref 30.0–36.0)
MCV: 91.2 fL (ref 80.0–100.0)
Platelets: 309 10*3/uL (ref 150–400)
RBC: 3.41 MIL/uL — ABNORMAL LOW (ref 4.22–5.81)
RDW: 14.5 % (ref 11.5–15.5)
WBC: 9.9 10*3/uL (ref 4.0–10.5)
nRBC: 0 % (ref 0.0–0.2)

## 2019-07-29 MED ORDER — SODIUM CHLORIDE 0.9% FLUSH: 3.0000 mL | Freq: Once | INTRAVENOUS | Status: DC

## 2019-07-29 NOTE — ED Triage Notes (Signed)
Pt reports today noting increased fluid retention, weight gain, nauseous but not vomiting, noted to have stage 3 kidney disease. Not on dialysis yet. Per patient noted creatine level 5.04 drawn on Friday which is elevated from his previous known.

## 2019-07-30 ENCOUNTER — Observation Stay (HOSPITAL_COMMUNITY): Payer: Medicare Other

## 2019-07-30 ENCOUNTER — Emergency Department (HOSPITAL_COMMUNITY): Payer: Medicare Other

## 2019-07-30 DIAGNOSIS — N289 Disorder of kidney and ureter, unspecified: Secondary | ICD-10-CM

## 2019-07-30 DIAGNOSIS — G4733 Obstructive sleep apnea (adult) (pediatric): Secondary | ICD-10-CM | POA: Diagnosis present

## 2019-07-30 DIAGNOSIS — N170 Acute kidney failure with tubular necrosis: Secondary | ICD-10-CM

## 2019-07-30 DIAGNOSIS — E1129 Type 2 diabetes mellitus with other diabetic kidney complication: Secondary | ICD-10-CM | POA: Diagnosis not present

## 2019-07-30 DIAGNOSIS — Z6841 Body Mass Index (BMI) 40.0 and over, adult: Secondary | ICD-10-CM | POA: Diagnosis not present

## 2019-07-30 DIAGNOSIS — N184 Chronic kidney disease, stage 4 (severe): Secondary | ICD-10-CM

## 2019-07-30 DIAGNOSIS — Z89611 Acquired absence of right leg above knee: Secondary | ICD-10-CM | POA: Diagnosis not present

## 2019-07-30 DIAGNOSIS — Z833 Family history of diabetes mellitus: Secondary | ICD-10-CM | POA: Diagnosis not present

## 2019-07-30 DIAGNOSIS — Z86711 Personal history of pulmonary embolism: Secondary | ICD-10-CM | POA: Diagnosis not present

## 2019-07-30 DIAGNOSIS — E8770 Fluid overload, unspecified: Secondary | ICD-10-CM

## 2019-07-30 DIAGNOSIS — Z20822 Contact with and (suspected) exposure to covid-19: Secondary | ICD-10-CM | POA: Diagnosis present

## 2019-07-30 DIAGNOSIS — N179 Acute kidney failure, unspecified: Secondary | ICD-10-CM | POA: Diagnosis not present

## 2019-07-30 DIAGNOSIS — M7989 Other specified soft tissue disorders: Secondary | ICD-10-CM | POA: Diagnosis not present

## 2019-07-30 DIAGNOSIS — N189 Chronic kidney disease, unspecified: Secondary | ICD-10-CM | POA: Diagnosis not present

## 2019-07-30 DIAGNOSIS — I5030 Unspecified diastolic (congestive) heart failure: Secondary | ICD-10-CM | POA: Diagnosis not present

## 2019-07-30 DIAGNOSIS — Z888 Allergy status to other drugs, medicaments and biological substances status: Secondary | ICD-10-CM | POA: Diagnosis not present

## 2019-07-30 DIAGNOSIS — Z8249 Family history of ischemic heart disease and other diseases of the circulatory system: Secondary | ICD-10-CM | POA: Diagnosis not present

## 2019-07-30 DIAGNOSIS — E11649 Type 2 diabetes mellitus with hypoglycemia without coma: Secondary | ICD-10-CM | POA: Diagnosis present

## 2019-07-30 DIAGNOSIS — E785 Hyperlipidemia, unspecified: Secondary | ICD-10-CM | POA: Diagnosis present

## 2019-07-30 DIAGNOSIS — J811 Chronic pulmonary edema: Secondary | ICD-10-CM | POA: Diagnosis not present

## 2019-07-30 DIAGNOSIS — D631 Anemia in chronic kidney disease: Secondary | ICD-10-CM | POA: Diagnosis present

## 2019-07-30 DIAGNOSIS — N183 Chronic kidney disease, stage 3 unspecified: Secondary | ICD-10-CM | POA: Diagnosis not present

## 2019-07-30 DIAGNOSIS — N185 Chronic kidney disease, stage 5: Secondary | ICD-10-CM | POA: Diagnosis present

## 2019-07-30 DIAGNOSIS — I517 Cardiomegaly: Secondary | ICD-10-CM | POA: Diagnosis not present

## 2019-07-30 DIAGNOSIS — Q600 Renal agenesis, unilateral: Secondary | ICD-10-CM | POA: Diagnosis not present

## 2019-07-30 DIAGNOSIS — N390 Urinary tract infection, site not specified: Secondary | ICD-10-CM | POA: Diagnosis present

## 2019-07-30 DIAGNOSIS — E1122 Type 2 diabetes mellitus with diabetic chronic kidney disease: Secondary | ICD-10-CM | POA: Diagnosis present

## 2019-07-30 DIAGNOSIS — I5032 Chronic diastolic (congestive) heart failure: Secondary | ICD-10-CM | POA: Diagnosis present

## 2019-07-30 DIAGNOSIS — I132 Hypertensive heart and chronic kidney disease with heart failure and with stage 5 chronic kidney disease, or end stage renal disease: Secondary | ICD-10-CM | POA: Diagnosis present

## 2019-07-30 DIAGNOSIS — I130 Hypertensive heart and chronic kidney disease with heart failure and stage 1 through stage 4 chronic kidney disease, or unspecified chronic kidney disease: Secondary | ICD-10-CM | POA: Diagnosis not present

## 2019-07-30 DIAGNOSIS — Z86718 Personal history of other venous thrombosis and embolism: Secondary | ICD-10-CM | POA: Diagnosis not present

## 2019-07-30 DIAGNOSIS — Z7901 Long term (current) use of anticoagulants: Secondary | ICD-10-CM | POA: Diagnosis not present

## 2019-07-30 DIAGNOSIS — Z794 Long term (current) use of insulin: Secondary | ICD-10-CM | POA: Diagnosis not present

## 2019-07-30 DIAGNOSIS — K219 Gastro-esophageal reflux disease without esophagitis: Secondary | ICD-10-CM | POA: Diagnosis present

## 2019-07-30 DIAGNOSIS — R609 Edema, unspecified: Secondary | ICD-10-CM | POA: Diagnosis not present

## 2019-07-30 DIAGNOSIS — I444 Left anterior fascicular block: Secondary | ICD-10-CM | POA: Diagnosis present

## 2019-07-30 LAB — GLUCOSE, CAPILLARY: Glucose-Capillary: 188 mg/dL — ABNORMAL HIGH (ref 70–99)

## 2019-07-30 LAB — URINALYSIS, ROUTINE W REFLEX MICROSCOPIC
Bilirubin Urine: NEGATIVE
Glucose, UA: NEGATIVE mg/dL
Hgb urine dipstick: NEGATIVE
Ketones, ur: NEGATIVE mg/dL
Nitrite: NEGATIVE
Protein, ur: 100 mg/dL — AB
Specific Gravity, Urine: 1.01 (ref 1.005–1.030)
WBC, UA: >50 WBC/hpf — ABNORMAL HIGH (ref 0–5)
pH: 5 (ref 5.0–8.0)

## 2019-07-30 LAB — PHOSPHORUS: Phosphorus: 5 mg/dL — ABNORMAL HIGH (ref 2.5–4.6)

## 2019-07-30 LAB — CBG MONITORING, ED
Glucose-Capillary: 121 mg/dL — ABNORMAL HIGH (ref 70–99)
Glucose-Capillary: 147 mg/dL — ABNORMAL HIGH (ref 70–99)
Glucose-Capillary: 51 mg/dL — ABNORMAL LOW (ref 70–99)
Glucose-Capillary: 55 mg/dL — ABNORMAL LOW (ref 70–99)
Glucose-Capillary: 57 mg/dL — ABNORMAL LOW (ref 70–99)
Glucose-Capillary: 86 mg/dL (ref 70–99)

## 2019-07-30 LAB — CK: Total CK: 926 U/L — ABNORMAL HIGH (ref 49–397)

## 2019-07-30 LAB — CBC
HCT: 33.5 % — ABNORMAL LOW (ref 39.0–52.0)
Hemoglobin: 10.4 g/dL — ABNORMAL LOW (ref 13.0–17.0)
MCH: 27.8 pg (ref 26.0–34.0)
MCHC: 31 g/dL (ref 30.0–36.0)
MCV: 89.6 fL (ref 80.0–100.0)
Platelets: 318 10*3/uL (ref 150–400)
RBC: 3.74 MIL/uL — ABNORMAL LOW (ref 4.22–5.81)
RDW: 14.5 % (ref 11.5–15.5)
WBC: 10.7 10*3/uL — ABNORMAL HIGH (ref 4.0–10.5)
nRBC: 0 % (ref 0.0–0.2)

## 2019-07-30 LAB — BASIC METABOLIC PANEL
Anion gap: 11 (ref 5–15)
BUN: 112 mg/dL — ABNORMAL HIGH (ref 6–20)
CO2: 26 mmol/L (ref 22–32)
Calcium: 9.7 mg/dL (ref 8.9–10.3)
Chloride: 99 mmol/L (ref 98–111)
Creatinine, Ser: 5.01 mg/dL — ABNORMAL HIGH (ref 0.61–1.24)
GFR calc Af Amer: 15 mL/min — ABNORMAL LOW (ref >60)
GFR calc non Af Amer: 13 mL/min — ABNORMAL LOW (ref >60)
Glucose, Bld: 157 mg/dL — ABNORMAL HIGH (ref 70–99)
Potassium: 3.5 mmol/L (ref 3.5–5.1)
Sodium: 136 mmol/L (ref 135–145)

## 2019-07-30 LAB — SARS CORONAVIRUS 2 BY RT PCR (HOSPITAL ORDER, PERFORMED IN ~~LOC~~ HOSPITAL LAB): SARS Coronavirus 2: NEGATIVE

## 2019-07-30 LAB — BRAIN NATRIURETIC PEPTIDE: B Natriuretic Peptide: 109.6 pg/mL — ABNORMAL HIGH (ref 0.0–100.0)

## 2019-07-30 MED ORDER — CARVEDILOL 25 MG PO TABS
25.0000 mg | ORAL_TABLET | Freq: Two times a day (BID) | ORAL | Status: DC
  Administered 2019-07-30 – 2019-07-31 (×4): 25 mg via ORAL
  Filled 2019-07-30 (×3): qty 1
  Filled 2019-07-30: qty 2

## 2019-07-30 MED ORDER — INSULIN ASPART 100 UNIT/ML ~~LOC~~ SOLN
0.0000 [IU] | Freq: Three times a day (TID) | SUBCUTANEOUS | Status: DC
  Administered 2019-07-30: 4 [IU] via SUBCUTANEOUS
  Administered 2019-07-30: 3 [IU] via SUBCUTANEOUS
  Administered 2019-07-31: 4 [IU] via SUBCUTANEOUS
  Administered 2019-07-31: 3 [IU] via SUBCUTANEOUS

## 2019-07-30 MED ORDER — INSULIN GLARGINE 100 UNIT/ML ~~LOC~~ SOLN
48.0000 [IU] | Freq: Every day | SUBCUTANEOUS | Status: DC
  Filled 2019-07-30: qty 0.48

## 2019-07-30 MED ORDER — TAMSULOSIN HCL 0.4 MG PO CAPS
0.4000 mg | ORAL_CAPSULE | Freq: Every day | ORAL | Status: DC
  Administered 2019-07-30: 0.4 mg via ORAL
  Filled 2019-07-30: qty 1

## 2019-07-30 MED ORDER — AMLODIPINE BESYLATE 10 MG PO TABS
10.0000 mg | ORAL_TABLET | Freq: Every day | ORAL | Status: DC
  Administered 2019-07-30: 10 mg via ORAL
  Filled 2019-07-30: qty 1

## 2019-07-30 MED ORDER — SODIUM CHLORIDE 0.9% FLUSH
3.0000 mL | Freq: Two times a day (BID) | INTRAVENOUS | Status: DC
  Administered 2019-07-30 – 2019-07-31 (×2): 3 mL via INTRAVENOUS

## 2019-07-30 MED ORDER — INSULIN GLARGINE 100 UNIT/ML ~~LOC~~ SOLN
30.0000 [IU] | Freq: Every day | SUBCUTANEOUS | Status: DC
  Administered 2019-07-30: 30 [IU] via SUBCUTANEOUS
  Filled 2019-07-30 (×2): qty 0.3

## 2019-07-30 MED ORDER — CALCITRIOL 0.5 MCG PO CAPS
0.5000 ug | ORAL_CAPSULE | Freq: Every day | ORAL | Status: DC
  Administered 2019-07-30 – 2019-07-31 (×2): 0.5 ug via ORAL
  Filled 2019-07-30 (×3): qty 1

## 2019-07-30 MED ORDER — ACETAMINOPHEN 650 MG RE SUPP: 650.0000 mg | Freq: Four times a day (QID) | RECTAL | Status: DC | PRN

## 2019-07-30 MED ORDER — INSULIN ASPART 100 UNIT/ML ~~LOC~~ SOLN: 0.0000 [IU] | Freq: Three times a day (TID) | SUBCUTANEOUS | Status: DC

## 2019-07-30 MED ORDER — CIPROFLOXACIN HCL 500 MG PO TABS
500.0000 mg | ORAL_TABLET | Freq: Two times a day (BID) | ORAL | Status: DC
  Administered 2019-07-30 (×2): 500 mg via ORAL
  Filled 2019-07-30 (×2): qty 1

## 2019-07-30 MED ORDER — SODIUM CHLORIDE 0.9 % IV SOLN
2.0000 g | Freq: Once | INTRAVENOUS | Status: AC
  Administered 2019-07-30: 2 g via INTRAVENOUS
  Filled 2019-07-30: qty 20

## 2019-07-30 MED ORDER — FUROSEMIDE 10 MG/ML IJ SOLN
80.0000 mg | Freq: Once | INTRAMUSCULAR | Status: DC
  Filled 2019-07-30: qty 8

## 2019-07-30 MED ORDER — FUROSEMIDE 80 MG PO TABS
80.0000 mg | ORAL_TABLET | Freq: Two times a day (BID) | ORAL | Status: DC
  Administered 2019-07-30 – 2019-07-31 (×3): 80 mg via ORAL
  Filled 2019-07-30 (×2): qty 1
  Filled 2019-07-30: qty 4

## 2019-07-30 MED ORDER — CLONIDINE HCL 0.1 MG PO TABS
0.3000 mg | ORAL_TABLET | Freq: Two times a day (BID) | ORAL | Status: DC
  Administered 2019-07-30 – 2019-07-31 (×3): 0.3 mg via ORAL
  Filled 2019-07-30 (×3): qty 3

## 2019-07-30 MED ORDER — METOLAZONE 2.5 MG PO TABS
2.5000 mg | ORAL_TABLET | ORAL | Status: DC
  Administered 2019-07-31: 2.5 mg via ORAL
  Filled 2019-07-30 (×2): qty 1

## 2019-07-30 MED ORDER — HYDRALAZINE HCL 50 MG PO TABS
50.0000 mg | ORAL_TABLET | Freq: Four times a day (QID) | ORAL | Status: DC
  Administered 2019-07-30 – 2019-07-31 (×6): 50 mg via ORAL
  Filled 2019-07-30 (×2): qty 1
  Filled 2019-07-30: qty 2
  Filled 2019-07-30 (×3): qty 1

## 2019-07-30 MED ORDER — HEPARIN SODIUM (PORCINE) 5000 UNIT/ML IJ SOLN: 5000.0000 [IU] | Freq: Three times a day (TID) | INTRAMUSCULAR | Status: DC

## 2019-07-30 MED ORDER — APIXABAN 5 MG PO TABS
5.0000 mg | ORAL_TABLET | Freq: Two times a day (BID) | ORAL | Status: DC
  Administered 2019-07-30 – 2019-07-31 (×3): 5 mg via ORAL
  Filled 2019-07-30 (×4): qty 1

## 2019-07-30 MED ORDER — ACETAMINOPHEN 325 MG PO TABS: 650.0000 mg | ORAL_TABLET | Freq: Four times a day (QID) | ORAL | Status: DC | PRN

## 2019-07-30 MED ORDER — ADULT MULTIVITAMIN W/MINERALS CH
1.0000 | ORAL_TABLET | Freq: Every day | ORAL | Status: DC
  Administered 2019-07-30 – 2019-07-31 (×2): 1 via ORAL
  Filled 2019-07-30 (×2): qty 1

## 2019-07-30 MED ORDER — GABAPENTIN 100 MG PO CAPS
100.0000 mg | ORAL_CAPSULE | Freq: Three times a day (TID) | ORAL | Status: DC
  Administered 2019-07-30 – 2019-07-31 (×5): 100 mg via ORAL
  Filled 2019-07-30 (×5): qty 1

## 2019-07-30 MED ORDER — FUROSEMIDE 10 MG/ML IJ SOLN
80.0000 mg | Freq: Once | INTRAMUSCULAR | Status: AC
  Administered 2019-07-30: 80 mg via INTRAVENOUS

## 2019-07-30 NOTE — ED Notes (Signed)
On recheck, pt CBG 57. Victor Abbott, RN notified. Pt given 2 cups orange juice and 3 packs graham crackers. Will recheck at 1250.

## 2019-07-30 NOTE — ED Notes (Signed)
Lunch Tray Ordered @ 1023. 

## 2019-07-30 NOTE — H&P (Addendum)
Date: 07/30/2019               Patient Name:  [name] MRN: [id number]  DOB: 1975-12-02 Age / Sex: 44 y.o., male   PCP: [name], MD         Medical Service: Internal Medicine Teaching Service         Attending Physician: Dr. Sid Falcon, MD    First Contact: Dr. Gaylan Gerold Pager: [phone]  Second Contact: Dr. Nathanial Rancher Pager: [phone]       After Hours (After 5p/  First Contact Pager: [phone]  weekends / holidays): Second Contact Pager: [phone]   Chief Complaint: Leg swelling  History of Present Illness: [name] is a 44 year old man with past medical history significant for CKD Stage 3, HFpEF (last EF 60-65%), T2DM (last HbA1c - 6.5), HTN, DVT/PE (on Eliquis), and HLD who presented to the ED with complaints of leg swelling.  [name] has been following closely in the outpatient setting with his nephrologist Dr. Joelyn Oms for management of his chronic kidney disease. Since his last hospital admission in May, his creatinine has gradually trended upwards from 3.12, to 4.92 at a pre-operative visit for consideration of bariatric surgery, to 5.04 four days ago. He reports that he has been attempting to lower his creatinine with diet modification and exercise, but is concerned about its upward trajectory. Additionally, he reports that over the past several weeks that he has had progressively worsening left lower extremity that is causing him discomfort. He has an above the knee amputation of the right lower extremity. He reports that his leg is not painful, red or warm. He currently takes lasix 80mg  twice daily and metolazone 2.5mg  every other day. Due to his uptrending creatinine and leg swelling he desired to come to the ED for evaluation. Of note, he had a urinalysis four days prior which revealed a urinary tract infection and he was subsequently started on a seven day course of ciprofloxacin. He has been adherent to this antibiotic for the past four days.  Otherwise he denies any complaints of chest pain, shortness of breath, orthopnea, paroxysmal nocturnal dyspnea, painful or frequent urination.  ED Course: On arrival to the ED, patient was afebrile, hypertensive (150/75), but otherwise hemodynamically stable. CBC revealed WBC 9.9, Hgb 9.8; BMP Cr 5.16; UA large leukocytes with few bacteria. Urine cultures and COVID swab were obtained. CXR showed cardiomegaly with pulmonary vascular congestion. He received 80mg  IV lasix and was started on ceftriaxone. He was admitted to [facility] for further evaluation and management.  Meds:  Current Meds  Medication Sig  . acidophilus (RISAQUAD) CAPS capsule Take 1 capsule by mouth daily.  Marland Kitchen allopurinol (ZYLOPRIM) 100 MG tablet Take 2 tablets (200 mg total) by mouth daily.  . Alpha-Lipoic Acid 600 MG CAPS Take 600 mg by mouth daily.  Marland Kitchen amLODipine (NORVASC) 10 MG tablet Take 10 mg by mouth at bedtime.  Marland Kitchen apixaban (ELIQUIS) 5 MG TABS tablet Take 1 tablet (5 mg total) by mouth 2 (two) times daily.  . calcitRIOL (ROCALTROL) 0.5 MCG capsule Take 0.5 mcg by mouth daily.  . carvedilol (COREG) 25 MG tablet Take 1 tablet (25 mg total) by mouth 2 (two) times daily with a meal.  . cloNIDine (CATAPRES) 0.3 MG tablet Take 1 tablet (0.3 mg total) by mouth 2 (two) times daily.  . Cyanocobalamin (B-12) 500 MCG SUBL Place 500 mcg under the tongue daily.   . famotidine (PEPCID) 20 MG tablet  Take 1 tablet (20 mg total) by mouth 2 (two) times daily. (Patient taking differently: Take 20 mg by mouth 2 (two) times daily as needed for heartburn or indigestion. )  . furosemide (LASIX) 80 MG tablet Take 1 tablet (80 mg total) by mouth 2 (two) times daily.  Marland Kitchen gabapentin (NEURONTIN) 100 MG capsule Take 1 capsule (100 mg total) by mouth 3 (three) times daily.  . hydrALAZINE (APRESOLINE) 50 MG tablet Take 1 tablet by mouth 4 times daily (Patient taking differently: Take 50 mg by mouth 4 (four) times daily. )  . insulin aspart (NOVOLOG) 100  UNIT/ML FlexPen Inject 25 Units into the skin 3 (three) times daily with meals. 20 unit morning, 25 units lunch 30 units for dinner (Patient taking differently: Inject 20-30 Units into the skin See admin instructions. 20- 25 units in the afternoon and 30 units before bedtime)  . lovastatin (MEVACOR) 20 MG tablet Take 1 tablet (20 mg total) by mouth daily. (Patient taking differently: Take 20 mg by mouth at bedtime. )  . Melatonin 10 MG TABS Take 10 mg by mouth at bedtime as needed (sleep).  . metolazone (ZAROXOLYN) 2.5 MG tablet Take 2.5 mg by mouth every other day.  . Omega-3 Fatty Acids (FISH OIL) 1000 MG CAPS Take 1,000 mg by mouth at bedtime.   . ondansetron (ZOFRAN ODT) 4 MG disintegrating tablet Take 1 tablet (4 mg total) by mouth every 8 (eight) hours as needed for nausea or vomiting.  Marland Kitchen oxybutynin (DITROPAN) 5 MG tablet Take 5 mg by mouth 3 (three) times daily.  . tamsulosin (FLOMAX) 0.4 MG CAPS capsule Take 0.4 mg by mouth at bedtime.   . TRESIBA FLEXTOUCH 200 UNIT/ML FlexTouch Pen INJECT 70 UNITS SUBCUTANEOUSLY ONCE DAILY (Patient taking differently: Inject 96 Units into the skin in the morning. )  . trimethoprim (TRIMPEX) 100 MG tablet Take 100 mg by mouth at bedtime.  Marland Kitchen VICTOZA 18 MG/3ML SOPN INJECT 1.2 MG (0.2 MLS) INTO THE SKIN ONCE DAILY AT THE SAME TIME.   Allergies: Allergies as of 07/29/2019 - Review Complete 07/29/2019  Allergen Reaction Noted  . Reglan [metoclopramide] Other (See Comments) 11/01/2016   Past Medical History:  Diagnosis Date  . Acute venous embolism and thrombosis of deep vessels of proximal lower extremity ([place]) 07/19/2011  . Anemia   . Anginal pain ([place])    pt denies  . Anxiety   . Chest pain, neg MI, normal coronaries by cath 02/18/2013   pt denies  . CHF (congestive heart failure) ([place])   . CKD (chronic kidney disease) stage 3, GFR 30-59 ml/min 02/19/2013  . Colon polyps   . Depression   . Diabetic ulcer of right foot ([place])   . DVT (deep venous  thrombosis) ([place]) 09/2002   patient reports additional DVTs in '06 & '11 (unconfirmed)  . ED (erectile dysfunction)   . GERD (gastroesophageal reflux disease)   . History of blood transfusion    "related to OR" (10/31/2016)  . Hyperlipidemia 02/19/2013  . Hypertension   . Nausea & vomiting    "constant for the last couple weeks" (10/31/2016)  . Nephrotic syndrome   . Obesity    BMI 44, weight 346 pounds 01/30/14  . OSA (obstructive sleep apnea) 02/28/2018   Mild obstructive sleep apnea with an AHI of 9.8/h but severe during REM sleep with an AHI of 33.8/h.  Oxygen saturations dropped to 86% and there was moderate snoring  . Peripheral edema   . Pneumonia   .  Prosthesis adjustment 08/17/2016  . Pulmonary embolism ([place]) 09/2002   treated with 6 months of warfarin  . Pyelonephritis 02/02/2018  . Type I diabetes mellitus ([place]) dx'd 2001  . Urinary retention    Family History:  Family History  Problem Relation Age of Onset  . Heart disease Mother   . Diabetes Mother   . Diabetes Maternal Uncle   . Diabetes Cousin    Social History:  Lives in [place] with mother and sister Denies smoking history Occasional alcohol consumption No recreational drug use  Review of Systems: A complete ROS was negative except as per HPI.   Physical Exam: Blood pressure (!) 142/84, pulse 73, temperature 98.1 F (36.7 C), temperature source Oral, resp. rate 15, height 6\' 2"  (1.88 m), weight (!) 163.3 kg, SpO2 96 %. Physical Exam Constitutional:      Appearance: Normal appearance. He is obese.  HENT:     Head: Normocephalic and atraumatic.  Eyes:     Extraocular Movements: Extraocular movements intact.     Conjunctiva/sclera: Conjunctivae normal.  Cardiovascular:     Rate and Rhythm: Normal rate and regular rhythm.     Pulses: Normal pulses.     Heart sounds: Normal heart sounds.  Pulmonary:     Effort: Pulmonary effort is normal.     Breath sounds: Normal breath sounds.  Abdominal:      General: Abdomen is flat. Bowel sounds are normal.     Palpations: Abdomen is soft.     Tenderness: There is no abdominal tenderness.  Musculoskeletal:     Cervical back: Normal range of motion and neck supple.     Left lower leg: Edema present.     Comments: 2+ pitting edema of LLE. Above the knee amputation of the RLE  Neurological:     General: No focal deficit present.     Mental Status: He is alert and oriented to person, place, and time. Mental status is at baseline.  Psychiatric:        Mood and Affect: Mood normal.        Behavior: Behavior normal.        Thought Content: Thought content normal.        Judgment: Judgment normal.    EKG: Sinus rhythm with 1st degree A-V block; Left anterior fascicular block; Minimal voltage criteria for LVH, may be normal variant ( R in aVL ); Abnormal ECG; When compared with ECG of 05/21/2019, No significant change was found  CXR: Cardiomegaly and pulmonary vascular congestion.  Assessment & Plan by Problem: Active Problems:   Hypervolemia associated with renal insufficiency [name] is a 44 year old man with past medical history significant for CKD Stage 3, HFpEF (last EF 60-65%), T2DM (last HbA1c - 6.5), HTN, DVT/PE (on Eliquis), and HLD who presented to the ED with complaints of leg swelling found to have hypervolemia in the setting of worsening CKD.  #Hypervolemia #Chronic kidney disease Patient's kidney function has progresively worsened over the past few months. His creatinine following his prior hospital admission in May was 3.12 at discharge, then 4.92 at pre-operative visit for consideration of bariatric surgery, 5.04 four days prior to presentation to the ED and today is 5.16. He follows closely with Dr. Joelyn Oms for his CKD and currently does not have plans for dialysis. Worsening creatinine in the setting of subjective complaints, physical examination findings and radiologic evidence of hypervolemia. He is adherent to home  regimen of 80mg  bid and metolazone 2.5mg  every other day, however his  current regiment may not be sufficient given worsening kidney function. -Continue home oral lasix 80mg  bid. -Continue home metolazone 2.5mg  every other day -BMP -Daily weights -Strict I's and O's  #UTI Patient appears to have history of chronic recurrent UTIs, however we do not have complete records from his urologist. He was diagnosed with urinary tract infection four days prior and started on course of ciprofloxacin for seven days. Although he denies any symptoms concerning for a UTI, currently afebrile with WBC of 9.9, incomplete resolution of his prior UTI is possible. UA inconclusive however has large leukocytes, many WBCs, with rare bacteria. He received one dose of ceftriaxone in the ED. -f/u urine culture -Restart ciprofloxacin 7/22  #Anemia 2/2 CKD Hb 9.8. Stable. -CBC  #T2DM Last HbA1c 6.5. On victoza and insulin regimen at home. Blood sugar of 55 upon rooming in ED, resolved to 147 following consumption of orange juice. -SSI resistant scale -Half home dose of lantus  #HTN #Chronic HFpEF Patient's last EF of 60-65% -BNP -Continue home amlodipine 10mg  -Continue home carvedilol 25mg  bid -Continue home clonidine 0.3mg  bid -Continue home hydralazine 50mg  qid -Continue home lasix 80mg  bid -Continue home metolazone 2.5mg  every other day  Code status: Full Diet: Renal/Carb modified with fluid restriction IVF: none VTE ppx: Continue home eliquis 5mg  bid  Dispo: Admit patient to Inpatient with expected length of stay greater than 2 midnights.  Signed: Paulla Dolly, MD 07/30/2019, 3:19 AM  Pager: [phone] After 5pm on weekdays and 1pm on weekends: On Call pager: [phone]

## 2019-07-30 NOTE — Plan of Care (Signed)
  Problem: Education: Goal: Knowledge of disease and its progression will improve Outcome: Progressing   Problem: Respiratory: Goal: Respiratory symptoms related to disease process will be avoided Outcome: Progressing   Problem: Urinary Elimination: Goal: Progression of disease will be identified and treated Outcome: Progressing

## 2019-07-30 NOTE — ED Provider Notes (Signed)
[facility] EMERGENCY DEPARTMENT Provider Note   CSN: [id number] Arrival date & time: 07/29/19  1252   History Chief Complaint  Patient presents with  . Weakness  . Weight Gain  . Nausea    [name] is a 44 y.o. male.  The history is provided by the patient.  Weakness He has history of diabetes, hypertension, hyperlipidemia, chronic kidney disease, heart failure and comes in because of leg swelling.  He was noted to have a bump in his creatinine when he had preop work-up for scheduled bariatric surgery.  Creatinine went up to 4.9.  He was noted to have a UTI and was put on a course of ciprofloxacin.  Creatinine was rechecked 4 days ago and had actually risen to 5.04.  Today, he noted swelling in his left leg.  He is status post right above-the-knee amputation.  He thinks he has put on about 15 pounds of fluid in the last 2 weeks.  He denies chest pain, heaviness, tightness, pressure and he denies dyspnea.  He has been compliant with his medications.  Past Medical History:  Diagnosis Date  . Acute venous embolism and thrombosis of deep vessels of proximal lower extremity ([place]) 07/19/2011  . Anemia   . Anginal pain ([place])    pt denies  . Anxiety   . Chest pain, neg MI, normal coronaries by cath 02/18/2013   pt denies  . CHF (congestive heart failure) ([place])   . CKD (chronic kidney disease) stage 3, GFR 30-59 ml/min 02/19/2013  . Colon polyps   . Depression   . Diabetic ulcer of right foot ([place])   . DVT (deep venous thrombosis) ([place]) 09/2002   patient reports additional DVTs in '06 & '11 (unconfirmed)  . ED (erectile dysfunction)   . GERD (gastroesophageal reflux disease)   . History of blood transfusion    "related to OR" (10/31/2016)  . Hyperlipidemia 02/19/2013  . Hypertension   . Nausea & vomiting    "constant for the last couple weeks" (10/31/2016)  . Nephrotic syndrome   . Obesity    BMI 44, weight 346 pounds 01/30/14  . OSA (obstructive sleep apnea)  02/28/2018   Mild obstructive sleep apnea with an AHI of 9.8/h but severe during REM sleep with an AHI of 33.8/h.  Oxygen saturations dropped to 86% and there was moderate snoring  . Peripheral edema   . Pneumonia   . Prosthesis adjustment 08/17/2016  . Pulmonary embolism ([place]) 09/2002   treated with 6 months of warfarin  . Pyelonephritis 02/02/2018  . Type I diabetes mellitus ([place]) dx'd 2001  . Urinary retention     Patient Active Problem List   Diagnosis Date Noted  . Multifocal pneumonia   . Acute pain of left knee 01/01/2019  . Hemoglobinuria 12/12/2018  . Hematoma of left lower leg 12/12/2018  . Urinary tract infection 09/04/2018  . Exposure to SARS-associated coronavirus 07/25/2018  . Headache 06/18/2018  . Leg swelling   . Cellulitis 04/12/2018  . Pain of left lower extremity 03/20/2018  . OSA (obstructive sleep apnea) 02/28/2018  . Chronic diastolic heart failure ([place]) 02/02/2018  . Healthcare maintenance 08/15/2017  . Erectile dysfunction 04/22/2017  . History of pulmonary embolus (PE) 06/09/2016  . Unilateral AKA, right ([place]) 12/26/2013  . Hypoglycemic event in diabetes ([place]) 12/17/2013  . Hyperlipidemia 02/19/2013  . S/P cardiac cath, 02/18/13, normal coronaries 02/19/2013  . Chronic renal insufficiency, stage 3 (moderate) 02/19/2013  . Hypertension 07/24/2011  . Long term  current use of anticoagulant therapy 07/19/2011  . History of DVT (deep vein thrombosis) 07/28/2009  . Morbid obesity ([place]) 10/26/2005  . Insulin dependent type 2 diabetes mellitus, controlled ([place]) 01/09/2002    Past Surgical History:  Procedure Laterality Date  . AMPUTATION Right 12/22/2013   Procedure: AMPUTATION BELOW KNEE;  Surgeon: [name], MD;  Location: [facility];  Service: Orthopedics;  Laterality: Right;  . AMPUTATION Right 02/19/2014   Procedure: RIGHT ABOVE KNEE AMPUTATION ;  Surgeon: [name], MD;  Location: [facility];  Service: Orthopedics;  Laterality: Right;  . blood clot removal   2016  . CARDIAC CATHETERIZATION  02/18/2013   normal coronaries  . ESOPHAGOGASTRODUODENOSCOPY N/A 11/02/2016   Procedure: ESOPHAGOGASTRODUODENOSCOPY (EGD);  Surgeon: [name], MD;  [facility] ENDOSCOPY;  Service: Endoscopy;  Laterality: N/A;  . I & D EXTREMITY Right 12/14/2013   Procedure: IRRIGATION AND DEBRIDEMENT RIGHT FOOT;  Surgeon: [name], MD;  Location: [facility];  Service: Orthopedics;  Laterality: Right;  . INCISION AND DRAINAGE ABSCESS  2007; 2015   "back"  . INCISION AND DRAINAGE OF WOUND Right 12/22/2013   Procedure: I&D RIGHT BUTTOCK;  Surgeon: [name], MD;  Location: [facility];  Service: Orthopedics;  Laterality: Right;  . LEFT HEART CATHETERIZATION WITH CORONARY ANGIOGRAM N/A 02/18/2013   Procedure: LEFT HEART CATHETERIZATION WITH CORONARY ANGIOGRAM;  Surgeon: [name], MD;  Location: [facility] CATH LAB;  Service: Cardiovascular;  Laterality: N/A;       Family History  Problem Relation Age of Onset  . Heart disease Mother   . Diabetes Mother   . Diabetes Maternal Uncle   . Diabetes Cousin     Social History   Tobacco Use  . Smoking status: Never Smoker  . Smokeless tobacco: Never Used  Vaping Use  . Vaping Use: Never used  Substance Use Topics  . Alcohol use: Yes    Comment: 10/31/2016 "a few beers q 6 months or so"  . Drug use: No    Home Medications Prior to Admission medications   Medication Sig Start Date End Date Taking? Authorizing Provider  acidophilus (RISAQUAD) CAPS capsule Take 1 capsule by mouth daily.   Yes [provider]  allopurinol (ZYLOPRIM) 100 MG tablet Take 2 tablets (200 mg total) by mouth daily. 09/05/18  Yes Aldine Contes, MD  Alpha-Lipoic Acid 600 MG CAPS Take 600 mg by mouth daily.   Yes [provider]  amLODipine (NORVASC) 10 MG tablet Take 10 mg by mouth at bedtime.   Yes [provider]  apixaban (ELIQUIS) 5 MG TABS tablet Take 1 tablet (5 mg total) by mouth 2 (two) times daily. 09/02/18   Yes Sid Falcon, MD  calcitRIOL (ROCALTROL) 0.5 MCG capsule Take 0.5 mcg by mouth daily. 05/06/19  Yes [provider]  carvedilol (COREG) 25 MG tablet Take 1 tablet (25 mg total) by mouth 2 (two) times daily with a meal. 06/03/19 09/01/19 Yes Chundi, Vahini, MD  cloNIDine (CATAPRES) 0.3 MG tablet Take 1 tablet (0.3 mg total) by mouth 2 (two) times daily. 06/26/19  Yes [name], MD  Cyanocobalamin (B-12) 500 MCG SUBL Place 500 mcg under the tongue daily.    Yes [provider]  famotidine (PEPCID) 20 MG tablet Take 1 tablet (20 mg total) by mouth 2 (two) times daily. Patient taking differently: Take 20 mg by mouth 2 (two) times daily as needed for heartburn or indigestion.  05/07/18  Yes Hedges, Dellis Filbert, PA-C  furosemide (LASIX)  80 MG tablet Take 1 tablet (80 mg total) by mouth 2 (two) times daily. 06/03/19 09/01/19 Yes Chundi, Vahini, MD  gabapentin (NEURONTIN) 100 MG capsule Take 1 capsule (100 mg total) by mouth 3 (three) times daily. 06/26/19  Yes [name], MD  hydrALAZINE (APRESOLINE) 50 MG tablet Take 1 tablet by mouth 4 times daily Patient taking differently: Take 50 mg by mouth 4 (four) times daily.  07/15/19  Yes Marianna Payment, MD  insulin aspart (NOVOLOG) 100 UNIT/ML FlexPen Inject 25 Units into the skin 3 (three) times daily with meals. 20 unit morning, 25 units lunch 30 units for dinner Patient taking differently: Inject 20-30 Units into the skin See admin instructions. 20- 25 units in the afternoon and 30 units before bedtime 10/04/18  Yes Mosetta Anis, MD  lovastatin (MEVACOR) 20 MG tablet Take 1 tablet (20 mg total) by mouth daily. Patient taking differently: Take 20 mg by mouth at bedtime.  09/24/18  Yes [name], MD  Melatonin 10 MG TABS Take 10 mg by mouth at bedtime as needed (sleep).   Yes [provider]  metolazone (ZAROXOLYN) 2.5 MG tablet Take 2.5 mg by mouth every other day. 03/30/18  Yes [provider]  Omega-3 Fatty Acids  (FISH OIL) 1000 MG CAPS Take 1,000 mg by mouth at bedtime.    Yes [provider]  ondansetron (ZOFRAN ODT) 4 MG disintegrating tablet Take 1 tablet (4 mg total) by mouth every 8 (eight) hours as needed for nausea or vomiting. 04/23/18  Yes Annia Belt, MD  oxybutynin (DITROPAN) 5 MG tablet Take 5 mg by mouth 3 (three) times daily.   Yes [provider]  tamsulosin (FLOMAX) 0.4 MG CAPS capsule Take 0.4 mg by mouth at bedtime.    Yes [provider]  TRESIBA FLEXTOUCH 200 UNIT/ML FlexTouch Pen INJECT 70 UNITS SUBCUTANEOUSLY ONCE DAILY Patient taking differently: Inject 96 Units into the skin in the morning.  04/16/19  Yes Agyei, Caprice Kluver, MD  trimethoprim (TRIMPEX) 100 MG tablet Take 100 mg by mouth at bedtime. 12/23/18  Yes [provider]  VICTOZA 18 MG/3ML SOPN INJECT 1.2 MG (0.2 MLS) INTO THE SKIN ONCE DAILY AT THE SAME TIME. 07/25/19  Yes [name], MD  Accu-Chek Softclix Lancets lancets Check blood sugar up to 3 times a day 05/08/19   Velna Ochs, MD  Blood Glucose Monitoring Suppl (ACCU-CHEK GUIDE ME) w/Device KIT Use to check blood sugar up to 3 times a day 05/06/19   [name], MD  glucose blood (ACCU-CHEK GUIDE) test strip Check blood sugar up to 3 times a day 05/08/19   Velna Ochs, MD  Insulin Pen Needle (B-D UF III MINI PEN NEEDLES) 31G X 5 MM MISC USE  4 TIMES DAILY 09/24/18   [name], MD  Insulin Pen Needle (PEN NEEDLES 3/16") 31G X 5 MM MISC Use five times daily 08/14/16   [name], DO  Lancet Devices ([place]) lancets Use to check blood sugars 5 times a day. Dx code:E11.9. Insulin dependent. 08/14/16   [name], DO    Allergies    Reglan [metoclopramide]  Review of Systems   Review of Systems  Neurological: Positive for weakness.  All other systems reviewed and are negative.   Physical Exam Updated Vital Signs BP 134/65   Pulse 74   Temp 98.1 F (36.7 C) (Oral)   Resp  17   Ht 6' 2"  (1.88 m)   Wt (!) 163.3 kg  SpO2 99%   BMI 46.22 kg/m   Physical Exam Vitals and nursing note reviewed.   Morbidly obese 44 year old male, resting comfortably and in no acute distress. Vital signs are normal. Oxygen saturation is 99%, which is normal. Head is normocephalic and atraumatic. PERRLA, EOMI. Oropharynx is clear. Neck is nontender and supple without adenopathy or JVD. Back is nontender and there is no CVA tenderness. Lungs are clear without rales, wheezes, or rhonchi. Chest is nontender. Heart has regular rate and rhythm without murmur. Abdomen is soft, flat, nontender without masses or hepatosplenomegaly and peristalsis is normoactive. Extremities: Right above-the-knee amputation.  2-3+ pitting pretibial and pedal edema on the left. Skin is warm and dry without rash. Neurologic: Mental status is normal, cranial nerves are intact, there are no motor or sensory deficits.  ED Results / Procedures / Treatments   Labs (all labs ordered are listed, but only abnormal results are displayed) Labs Reviewed  BASIC METABOLIC PANEL - Abnormal; Notable for the following components:      Result Value   Sodium 134 (*)    Glucose, Bld 212 (*)    BUN 113 (*)    Creatinine, Ser 5.16 (*)    GFR calc non Af Amer 13 (*)    GFR calc Af Amer 15 (*)    All other components within normal limits  CBC - Abnormal; Notable for the following components:   RBC 3.41 (*)    Hemoglobin 9.8 (*)    HCT 31.1 (*)    All other components within normal limits  URINALYSIS, ROUTINE W REFLEX MICROSCOPIC - Abnormal; Notable for the following components:   APPearance HAZY (*)    Protein, ur 100 (*)    Leukocytes,Ua LARGE (*)    WBC, UA >50 (*)    Bacteria, UA FEW (*)    All other components within normal limits  CBG MONITORING, ED - Abnormal; Notable for the following components:   Glucose-Capillary 55 (*)    All other components within normal limits  CBG MONITORING, ED - Abnormal;  Notable for the following components:   Glucose-Capillary 147 (*)    All other components within normal limits  URINE CULTURE  SARS CORONAVIRUS 2 BY RT PCR [facility] ORDER, [place] [facility])    EKG EKG Interpretation  Date/Time:  Tuesday July 29 2019 13:10:27 EDT Ventricular Rate:  77 PR Interval:  224 QRS Duration: 90 QT Interval:  380 QTC Calculation: 430 R Axis:   -48 Text Interpretation: Sinus rhythm with 1st degree A-V block Left anterior fascicular block Minimal voltage criteria for LVH, may be normal variant ( R in aVL ) Abnormal ECG When compared with ECG of 05/21/2019, No significant change was found Confirmed by Delora Fuel ([id number]) on 07/29/2019 10:58:26 PM   Radiology DG Chest Port 1 View  Result Date: 07/30/2019 CLINICAL DATA:  Fluid overload EXAM: PORTABLE CHEST 1 VIEW COMPARISON:  May 21, 2019 FINDINGS: Again noted is cardiomegaly. Overall shallow degree of aeration. Prominence of the central pulmonary vasculature is noted. No large airspace consolidation. No acute osseous abnormality. IMPRESSION: Cardiomegaly and pulmonary vascular congestion. Electronically Signed   By: Prudencio Pair M.D.   On: 07/30/2019 01:17    Procedures Procedures  Medications Ordered in ED Medications  sodium chloride flush (NS) 0.9 % injection 3 mL (has no administration in time range)  furosemide (LASIX) injection 80 mg (has no administration in time range)    ED Course  I have reviewed the  triage vital signs and the nursing notes.  Pertinent labs & imaging results that were available during my care of the patient were reviewed by me and considered in my medical decision making (see chart for details).  MDM Rules/Calculators/A&P Peripheral edema in the setting worsening renal insufficiency.  Creatinine today is actually increasing and is now 5.16.  He did have an episode of mild hypoglycemia with glucose going down to 55 and it came up to 147 following administration  of orange juice.  Anemia is present and unchanged from baseline and felt to be due to chronic renal disease.  Will need to check urinalysis to make sure he does not have ongoing UTI, will check chest x-ray looking for signs of heart failure.  He is given a dose of intravenous furosemide.  Old records are reviewed confirming chronic kidney disease with recent worsening of creatinine.  Chest x-ray does show signs of heart failure.  Urinalysis shows evidence of UTI.  This is concerning since he had just completed a course of ciprofloxacin.  Given worsening renal failure, progressive edema, and persistent UTI, I feel patient needs to be admitted for IV antibiotics, diuresis, and attempt to maximize renal function.  Case is discussed with Dr. Charleen Kirks of internal medicine teaching service, who agrees to admit the patient.  Final Clinical Impression(s) / ED Diagnoses Final diagnoses:  Acute renal failure with acute tubular necrosis superimposed on stage 4 chronic kidney disease (HCC)  Peripheral edema  Urinary tract infection without hematuria, site unspecified  Anemia associated with chronic renal failure  Hypoglycemia    Rx / DC Orders ED Discharge Orders    None       Delora Fuel, MD 82/88/33 [phone]

## 2019-07-30 NOTE — Progress Notes (Signed)
New Admission Note:   Arrival Method:  Stretcher  Mental Orientation: A & O x 4  Telemetry: On cardiac monitor  NSR  Assessment: Completed Skin: dry and intact  IV: NSL right AC Pain: denies  Tubes: none Safety Measures: Safety Fall Prevention Plan has been given, discussed and signed Admission: Completed [facility] Orientation: Patient has been orientated to the room, unit and staff.  Family: unavailable  Orders have been reviewed and implemented. Will continue to monitor the patient. Call light has been placed within reach and bed alarm has been activated.   Dolores Hoose, RN

## 2019-07-30 NOTE — ED Notes (Signed)
Pt CBG 51. Carlis Abbott, RN notified. Pt given 2 cups orange juice. Will recheck at 1220.

## 2019-07-30 NOTE — Consult Note (Signed)
[name] Admit Date: 07/29/2019 07/30/2019 Gean Quint Requesting Physician:  Gilles Chiquito  Reason for Consult:  aki on ckd, hypervolemia HPI:  This is a 44 year old male with a past medical history of CKD stage III (was following with Dr. Lorrene Reid and then recently with Dr. Joelyn Oms), hypertension, diabetes, HFpEF, history of DVT/PE (on Eliquis), right AKA, recurrent UTIs who presents to the ER with complaints of leg swelling.  Since his last admission in May his creatinine has been gradually uptrending to around 5.  He is being considered for bariatric surgery and was not able to go through with it because of his renal function.  Since then he has been diet modifications as well as exercise.  He has been recently treated for another UTI with 7-day course of Cipro.  Denies any fevers, chest pain, flank pain, abdominal pain, diarrhea/nausea/vomiting, loss of appetite, shortness of breath, orthopnea, paroxysmal nocturnal dyspnea, dysgeusia.  He has been taking some herbal supplements but has not been taking any over-the-counter medications for example NSAIDs.  PMH Incudes: Past Medical History:  Diagnosis Date  . Acute venous embolism and thrombosis of deep vessels of proximal lower extremity ([place]) 07/19/2011  . Anemia   . Anginal pain ([place])    pt denies  . Anxiety   . Chest pain, neg MI, normal coronaries by cath 02/18/2013   pt denies  . CHF (congestive heart failure) ([place])   . CKD (chronic kidney disease) stage 3, GFR 30-59 ml/min 02/19/2013  . Colon polyps   . Depression   . Diabetic ulcer of right foot ([place])   . DVT (deep venous thrombosis) ([place]) 09/2002   patient reports additional DVTs in '06 & '11 (unconfirmed)  . ED (erectile dysfunction)   . GERD (gastroesophageal reflux disease)   . History of blood transfusion    "related to OR" (10/31/2016)  . Hyperlipidemia 02/19/2013  . Hypertension   . Nausea & vomiting    "constant for the last couple weeks" (10/31/2016)  . Nephrotic  syndrome   . Obesity    BMI 44, weight 346 pounds 01/30/14  . OSA (obstructive sleep apnea) 02/28/2018   Mild obstructive sleep apnea with an AHI of 9.8/h but severe during REM sleep with an AHI of 33.8/h.  Oxygen saturations dropped to 86% and there was moderate snoring  . Peripheral edema   . Pneumonia   . Prosthesis adjustment 08/17/2016  . Pulmonary embolism ([place]) 09/2002   treated with 6 months of warfarin  . Pyelonephritis 02/02/2018  . Type I diabetes mellitus ([place]) dx'd 2001  . Urinary retention        Creat (mg/dL)  Date Value  12/09/2013 1.78 (H)  12/24/2012 0.93  02/19/2012 0.92  02/09/2012 0.97  07/24/2011 0.84   Creatinine, Ser (mg/dL)  Date Value  07/30/2019 5.01 (H)  07/29/2019 5.16 (H)  07/03/2019 4.92 (H)  06/03/2019 3.12 (H)  05/25/2019 3.93 (H)  05/24/2019 4.02 (H)  05/23/2019 4.27 (H)  05/22/2019 3.96 (H)  05/21/2019 3.82 (H)  05/08/2019 4.07 (H)  ] I/Os:  ROS Balance of 12 systems is negative w/ exceptions as above  PMH  Past Medical History:  Diagnosis Date  . Acute venous embolism and thrombosis of deep vessels of proximal lower extremity ([place]) 07/19/2011  . Anemia   . Anginal pain ([place])    pt denies  . Anxiety   . Chest pain, neg MI, normal coronaries by cath 02/18/2013   pt denies  . CHF (congestive heart failure) ([place])   .  CKD (chronic kidney disease) stage 3, GFR 30-59 ml/min 02/19/2013  . Colon polyps   . Depression   . Diabetic ulcer of right foot ([place])   . DVT (deep venous thrombosis) ([place]) 09/2002   patient reports additional DVTs in '06 & '11 (unconfirmed)  . ED (erectile dysfunction)   . GERD (gastroesophageal reflux disease)   . History of blood transfusion    "related to OR" (10/31/2016)  . Hyperlipidemia 02/19/2013  . Hypertension   . Nausea & vomiting    "constant for the last couple weeks" (10/31/2016)  . Nephrotic syndrome   . Obesity    BMI 44, weight 346 pounds 01/30/14  . OSA (obstructive sleep apnea) 02/28/2018    Mild obstructive sleep apnea with an AHI of 9.8/h but severe during REM sleep with an AHI of 33.8/h.  Oxygen saturations dropped to 86% and there was moderate snoring  . Peripheral edema   . Pneumonia   . Prosthesis adjustment 08/17/2016  . Pulmonary embolism ([place]) 09/2002   treated with 6 months of warfarin  . Pyelonephritis 02/02/2018  . Type I diabetes mellitus ([place]) dx'd 2001  . Urinary retention    PSH  Past Surgical History:  Procedure Laterality Date  . AMPUTATION Right 12/22/2013   Procedure: AMPUTATION BELOW KNEE;  Surgeon: [name], MD;  Location: [facility];  Service: Orthopedics;  Laterality: Right;  . AMPUTATION Right 02/19/2014   Procedure: RIGHT ABOVE KNEE AMPUTATION ;  Surgeon: [name], MD;  Location: [facility];  Service: Orthopedics;  Laterality: Right;  . blood clot removal  2016  . CARDIAC CATHETERIZATION  02/18/2013   normal coronaries  . ESOPHAGOGASTRODUODENOSCOPY N/A 11/02/2016   Procedure: ESOPHAGOGASTRODUODENOSCOPY (EGD);  Surgeon: [name], MD;  Location[facility] ENDOSCOPY;  Service: Endoscopy;  Laterality: N/A;  . I & D EXTREMITY Right 12/14/2013   Procedure: IRRIGATION AND DEBRIDEMENT RIGHT FOOT;  Surgeon: [name], MD;  Location: [facility];  Service: Orthopedics;  Laterality: Right;  . INCISION AND DRAINAGE ABSCESS  2007; 2015   "back"  . INCISION AND DRAINAGE OF WOUND Right 12/22/2013   Procedure: I&D RIGHT BUTTOCK;  Surgeon: [name], MD;  Location: [facility];  Service: Orthopedics;  Laterality: Right;  . LEFT HEART CATHETERIZATION WITH CORONARY ANGIOGRAM N/A 02/18/2013   Procedure: LEFT HEART CATHETERIZATION WITH CORONARY ANGIOGRAM;  Surgeon: [name], MD;  Location: [facility] CATH LAB;  Service: Cardiovascular;  Laterality: N/A;   FH  Family History  Problem Relation Age of Onset  . Heart disease Mother   . Diabetes Mother   . Diabetes Maternal Uncle   . Diabetes Cousin    SH  reports that he has never smoked. He has never used smokeless tobacco. He  reports current alcohol use. He reports that he does not use drugs. Allergies  Allergies  Allergen Reactions  . Reglan [Metoclopramide] Other (See Comments)    Dysphoric reaction   Home medications Prior to Admission medications   Medication Sig Start Date End Date Taking? Authorizing Provider  acidophilus (RISAQUAD) CAPS capsule Take 1 capsule by mouth daily.   Yes [provider]  allopurinol (ZYLOPRIM) 100 MG tablet Take 2 tablets (200 mg total) by mouth daily. 09/05/18  Yes Aldine Contes, MD  Alpha-Lipoic Acid 600 MG CAPS Take 600 mg by mouth daily.   Yes [provider]  amLODipine (NORVASC) 10 MG tablet Take 10 mg by mouth at bedtime.   Yes [provider]  apixaban (ELIQUIS) 5 MG TABS tablet  Take 1 tablet (5 mg total) by mouth 2 (two) times daily. 09/02/18  Yes Sid Falcon, MD  calcitRIOL (ROCALTROL) 0.5 MCG capsule Take 0.5 mcg by mouth daily. 05/06/19  Yes [provider]  carvedilol (COREG) 25 MG tablet Take 1 tablet (25 mg total) by mouth 2 (two) times daily with a meal. 06/03/19 09/01/19 Yes Chundi, Vahini, MD  cloNIDine (CATAPRES) 0.3 MG tablet Take 1 tablet (0.3 mg total) by mouth 2 (two) times daily. 06/26/19  Yes [name], MD  Cyanocobalamin (B-12) 500 MCG SUBL Place 500 mcg under the tongue daily.    Yes [provider]  famotidine (PEPCID) 20 MG tablet Take 1 tablet (20 mg total) by mouth 2 (two) times daily. Patient taking differently: Take 20 mg by mouth 2 (two) times daily as needed for heartburn or indigestion.  05/07/18  Yes Hedges, Dellis Filbert, PA-C  furosemide (LASIX) 80 MG tablet Take 1 tablet (80 mg total) by mouth 2 (two) times daily. 06/03/19 09/01/19 Yes Chundi, Vahini, MD  gabapentin (NEURONTIN) 100 MG capsule Take 1 capsule (100 mg total) by mouth 3 (three) times daily. 06/26/19  Yes [name], MD  hydrALAZINE (APRESOLINE) 50 MG tablet Take 1 tablet by mouth 4 times daily Patient taking differently: Take 50 mg  by mouth 4 (four) times daily.  07/15/19  Yes Marianna Payment, MD  insulin aspart (NOVOLOG) 100 UNIT/ML FlexPen Inject 25 Units into the skin 3 (three) times daily with meals. 20 unit morning, 25 units lunch 30 units for dinner Patient taking differently: Inject 20-30 Units into the skin See admin instructions. 20- 25 units in the afternoon and 30 units before bedtime 10/04/18  Yes Mosetta Anis, MD  lovastatin (MEVACOR) 20 MG tablet Take 1 tablet (20 mg total) by mouth daily. Patient taking differently: Take 20 mg by mouth at bedtime.  09/24/18  Yes [name], MD  Melatonin 10 MG TABS Take 10 mg by mouth at bedtime as needed (sleep).   Yes [provider]  metolazone (ZAROXOLYN) 2.5 MG tablet Take 2.5 mg by mouth every other day. 03/30/18  Yes [provider]  Omega-3 Fatty Acids (FISH OIL) 1000 MG CAPS Take 1,000 mg by mouth at bedtime.    Yes [provider]  ondansetron (ZOFRAN ODT) 4 MG disintegrating tablet Take 1 tablet (4 mg total) by mouth every 8 (eight) hours as needed for nausea or vomiting. 04/23/18  Yes Annia Belt, MD  oxybutynin (DITROPAN) 5 MG tablet Take 5 mg by mouth 3 (three) times daily.   Yes [provider]  tamsulosin (FLOMAX) 0.4 MG CAPS capsule Take 0.4 mg by mouth at bedtime.    Yes [provider]  TRESIBA FLEXTOUCH 200 UNIT/ML FlexTouch Pen INJECT 70 UNITS SUBCUTANEOUSLY ONCE DAILY Patient taking differently: Inject 96 Units into the skin in the morning.  04/16/19  Yes Agyei, Caprice Kluver, MD  trimethoprim (TRIMPEX) 100 MG tablet Take 100 mg by mouth at bedtime. 12/23/18  Yes [provider]  VICTOZA 18 MG/3ML SOPN INJECT 1.2 MG (0.2 MLS) INTO THE SKIN ONCE DAILY AT THE SAME TIME. 07/25/19  Yes [name], MD  Accu-Chek Softclix Lancets lancets Check blood sugar up to 3 times a day 05/08/19   Velna Ochs, MD  Blood Glucose Monitoring Suppl (ACCU-CHEK GUIDE ME) w/Device KIT Use to check blood sugar up to 3 times a  day 05/06/19   [name], MD  glucose blood (ACCU-CHEK GUIDE) test strip Check blood sugar up to  3 times a day 05/08/19   Velna Ochs, MD  Insulin Pen Needle (B-D UF III MINI PEN NEEDLES) 31G X 5 MM MISC USE  4 TIMES DAILY 09/24/18   [name], MD  Insulin Pen Needle (PEN NEEDLES 3/16") 31G X 5 MM MISC Use five times daily 08/14/16   [name], DO  Lancet Devices [facility] lancets Use to check blood sugars 5 times a day. Dx code:E11.9. Insulin dependent. 08/14/16   [name], DO    Current Medications Scheduled Meds: . amLODipine  10 mg Oral QHS  . apixaban  5 mg Oral BID  . calcitRIOL  0.5 mcg Oral Daily  . carvedilol  25 mg Oral BID WC  . ciprofloxacin  500 mg Oral BID  . cloNIDine  0.3 mg Oral BID  . furosemide  80 mg Oral BID  . gabapentin  100 mg Oral TID  . hydrALAZINE  50 mg Oral QID  . insulin aspart  0-20 Units Subcutaneous TID WC  . insulin glargine  48 Units Subcutaneous QHS  . metolazone  2.5 mg Oral QODAY  . multivitamin with minerals  1 tablet Oral Daily  . sodium chloride flush  3 mL Intravenous Once  . sodium chloride flush  3 mL Intravenous Q12H  . tamsulosin  0.4 mg Oral QHS   Continuous Infusions: PRN Meds:.acetaminophen **OR** acetaminophen  CBC Recent Labs  Lab 07/29/19 1313 07/30/19 0332  WBC 9.9 10.7*  HGB 9.8* 10.4*  HCT 31.1* 33.5*  MCV 91.2 89.6  PLT 309 595   Basic Metabolic Panel Recent Labs  Lab 07/29/19 1313 07/30/19 0332  NA 134* 136  K 3.6 3.5  CL 98 99  CO2 25 26  GLUCOSE 212* 157*  BUN 113* 112*  CREATININE 5.16* 5.01*  CALCIUM 9.3 9.7    Physical Exam  Blood pressure (!) 148/88, pulse 71, temperature 98.1 F (36.7 C), temperature source Oral, resp. rate 19, height _0  (1.88 m), weight (!) 163.3 kg, SpO2 96 %. GEN: wdwn, sitting up in bed, nad ENT: no nasal discharge, mmm EYES: no scleral icterus, eomi CV: normal rate, no murmurs PULM: no iwob, bilateral chest rise,  clear to auscultation bilaterally ABD: NABS, non-distended, obese SKIN: no rashes or jaundice EXT: 2+ pitting edema left lower extremity with right stump edema, warm and well perfused Neuro: No asterixis, speech clear and coherent, alert oriented x3, moves all extremities spontaneously  Assessment 1. AKI on CKD stage III, underlying CKD can be secondary to diabetic nephropathy (historically microalbuminuric) and possibly obesity related FSGS (has had nephrotic range proteinuria).  Current rising creatinine is currently questionable but can be due to progression of CKD versus decreased effective arterial blood flow in the setting of renovascular congestion.  We will monitor him and see how his renal function does with diuresis 2. Hypervolemia, worsening leg swelling 3. Hypertension 4. History of DVT/PE (on Eliquis) 5. History of right AKA 6. HFpEF, 7. Diabetes mellitus 8. Anemia of chronic renal disease, normocytic.  Stable  Plan 1. Renal ultrasound (rise in cr, h/o recurrent uti's) 2. No indication for renal replacement therapy currently 3. Check differential on CBC and check CK levels 4. Agree with Lasix 80 mg twice daily along with metolazone 2.5 mg daily 5. Repeat urine albumin to creatinine ratio and urine protein to creatinine ratio 6. Check phosphorus and PTH 7. Avoid nephrotoxic medications including NSAIDs and iodinated intravenous contrast exposure unless the latter is absolutely indicated.  Preferred narcotic agents  for pain control are hydromorphone, fentanyl, and methadone. Morphine should not be used. Avoid Baclofen and avoid oral sodium phosphate and magnesium citrate based laxatives / bowel preps. Continue strict Input and Output monitoring. Will monitor the patient closely with you and intervene or adjust therapy as indicated by changes in clinical status/labs   Gean Quint, MD [place] 07/30/2019, 1:37 PM

## 2019-07-30 NOTE — ED Notes (Signed)
Pt given Orange Juice to in attempt to correct hypoglycemia. Will repeat CBG and reassess.

## 2019-07-30 NOTE — ED Notes (Signed)
Breakfast ordered--[name] 

## 2019-07-30 NOTE — Progress Notes (Signed)
Subjective: HD#0   Overnight: No  Today, [name] was upset this morning as he reports that he keeps running into problems each time he inches close to scheduling his bariatric surgery. He currently denies abdominal pain, chest pain, shortness of breath. He also reports following up with urology. He does wear compression stockings and also has an elevation pillow at home.   Objective:  Vital signs in last 24 hours: Vitals:   07/30/19 0015 07/30/19 0213 07/30/19 0315 07/30/19 1115  BP: 134/65 (!) 142/84 (!) 144/90 (!) 127/59  Pulse: 74 73 77 72  Resp: 17 15 15 16   Temp:      TempSrc:      SpO2: 99% 96% 97% 97%  Weight:      Height:       Physical Exam Constitutional:      General: He is not in acute distress.    Appearance: He is not toxic-appearing.  HENT:     Head: Normocephalic.  Eyes:     General: No scleral icterus.    Conjunctiva/sclera: Conjunctivae normal.  Cardiovascular:     Rate and Rhythm: Normal rate and regular rhythm.     Heart sounds: Normal heart sounds.  Pulmonary:     Effort: Pulmonary effort is normal. No respiratory distress.     Comments: Mild diminished breath sound at bilateral lung bases Abdominal:     Palpations: Abdomen is soft.     Tenderness: There is no abdominal tenderness.  Musculoskeletal:     Cervical back: Normal range of motion.     Left lower leg: Edema (+1-2) present.     Comments: Right AKA  Skin:    General: Skin is warm.     Coloration: Skin is not jaundiced.  Neurological:     Mental Status: He is alert.  Psychiatric:        Thought Content: Thought content normal.        Judgment: Judgment normal.     Assessment/Plan: [name] is a 44 year old man with PMH of CKD Stage 3, HFpEF (last EF 60-65%), T2DM (last HbA1c - 6.5), HTN, DVT/PE (on Eliquis), and HLD who presented to the ED with complaints of leg swelling found to have hypervolemia in the setting of worsening CKD. Nephrology consulted.   Active  Problems:   Hypervolemia associated with renal insufficiency    Hypervolemia Chronic kidney disease stage 3 Patient's kidney function has progresively worsened over the past few months. Patient is seen by Dr. Joelyn Kelley for his CKD and currently does not have plans for dialysis. He states that he is compliant to his home regimen of 80mg  bid and metolazone 2.5mg  every other day. This appears to be the sign of worsening of his CKD and not controlled with current home medication regimen. Nephrology consulted and will see the patient today.  - Await nephrology recommendation - Continue home oral lasix 80mg  bid. - Continue home metolazone 2.5mg  every other day - Monitor BMP daily  - Daily weights - Renal diet - Strict I's and O's   UTI Patient appears to have history of chronic recurrent UTIs, however we do not have complete records from his urologist. He was diagnosed with UTI four days prior and started on course of ciprofloxacin for seven days which he already finished 4 days. He had a cystoscopy done in 02/2018 reveals no obstruction or stricture or stones. UA inconclusive however has large leukocytes, many WBCs, with rare bacteria. Currently he denies dysuria.  - Pending urine culture -  Resume ciprofloxacin tomorrow for 2 more days. Patient received 2g of Ceftriaxone in the ED today.    Anemia 2/2 CKD His Hgb baseline ~ 9 - 10.  - Continue monitor CBC   T2DM Last HbA1c 6.5 in June 2021. Home regimen: victoza, 90 units Tresiba Qd and Novolog 15 units at breakfast and 20-25 units at lunch and dinner.  - SSI resistant scale - Continue 48 units Novolog - Monitor CBG   HTN Resume home BP meds. Current BP 127/59 - Continue home amlodipine 10mg  - Continue home carvedilol 25mg  bid - Continue home clonidine 0.3mg  bid - Continue home hydralazine 50mg  qid - Continue home lasix 80mg  bid - Continue home metolazone 2.5mg  every other day   History of DVT/PE - Continue Apixaban   HWK:GSUP  modified VTE ppx: Eliquis CODE STATUS: Full  Prior to Admission Living Arrangement: home Anticipated Discharge Location: home Barriers to Discharge: Nephrology consultation  Dispo: Anticipated discharge in approximately 1 day(s).    Victor Gerold, DO Pager: [phone] Internal Medicine Teaching Service After 5pm on weekdays and 1pm on weekends: On Call pager: [phone]

## 2019-07-31 ENCOUNTER — Other Ambulatory Visit: Payer: Self-pay | Admitting: Internal Medicine

## 2019-07-31 DIAGNOSIS — F439 Reaction to severe stress, unspecified: Secondary | ICD-10-CM

## 2019-07-31 DIAGNOSIS — D631 Anemia in chronic kidney disease: Secondary | ICD-10-CM

## 2019-07-31 DIAGNOSIS — R600 Localized edema: Secondary | ICD-10-CM

## 2019-07-31 DIAGNOSIS — E119 Type 2 diabetes mellitus without complications: Secondary | ICD-10-CM

## 2019-07-31 DIAGNOSIS — Z794 Long term (current) use of insulin: Secondary | ICD-10-CM

## 2019-07-31 DIAGNOSIS — N185 Chronic kidney disease, stage 5: Secondary | ICD-10-CM

## 2019-07-31 DIAGNOSIS — N189 Chronic kidney disease, unspecified: Secondary | ICD-10-CM

## 2019-07-31 DIAGNOSIS — R609 Edema, unspecified: Secondary | ICD-10-CM

## 2019-07-31 LAB — CBC WITH DIFFERENTIAL/PLATELET
Abs Immature Granulocytes: 0.03 10*3/uL (ref 0.00–0.07)
Basophils Absolute: 0.1 10*3/uL (ref 0.0–0.1)
Basophils Relative: 1 %
Eosinophils Absolute: 0.4 10*3/uL (ref 0.0–0.5)
Eosinophils Relative: 4 %
HCT: 32.3 % — ABNORMAL LOW (ref 39.0–52.0)
Hemoglobin: 10.2 g/dL — ABNORMAL LOW (ref 13.0–17.0)
Immature Granulocytes: 0 %
Lymphocytes Relative: 22 %
Lymphs Abs: 1.9 10*3/uL (ref 0.7–4.0)
MCH: 28.4 pg (ref 26.0–34.0)
MCHC: 31.6 g/dL (ref 30.0–36.0)
MCV: 90 fL (ref 80.0–100.0)
Monocytes Absolute: 0.9 10*3/uL (ref 0.1–1.0)
Monocytes Relative: 10 %
Neutro Abs: 5.5 10*3/uL (ref 1.7–7.7)
Neutrophils Relative %: 63 %
Platelets: 318 10*3/uL (ref 150–400)
RBC: 3.59 MIL/uL — ABNORMAL LOW (ref 4.22–5.81)
RDW: 14.6 % (ref 11.5–15.5)
WBC: 8.8 10*3/uL (ref 4.0–10.5)
nRBC: 0 % (ref 0.0–0.2)

## 2019-07-31 LAB — URINE CULTURE: Culture: >=100000 — AB

## 2019-07-31 LAB — PARATHYROID HORMONE, INTACT (NO CA): PTH: 28 pg/mL (ref 15–65)

## 2019-07-31 LAB — RENAL FUNCTION PANEL
Albumin: 3.3 g/dL — ABNORMAL LOW (ref 3.5–5.0)
Anion gap: 12 (ref 5–15)
BUN: 108 mg/dL — ABNORMAL HIGH (ref 6–20)
CO2: 25 mmol/L (ref 22–32)
Calcium: 9.4 mg/dL (ref 8.9–10.3)
Chloride: 101 mmol/L (ref 98–111)
Creatinine, Ser: 4.86 mg/dL — ABNORMAL HIGH (ref 0.61–1.24)
GFR calc Af Amer: 16 mL/min — ABNORMAL LOW (ref >60)
GFR calc non Af Amer: 14 mL/min — ABNORMAL LOW (ref >60)
Glucose, Bld: 116 mg/dL — ABNORMAL HIGH (ref 70–99)
Phosphorus: 4.7 mg/dL — ABNORMAL HIGH (ref 2.5–4.6)
Potassium: 3.2 mmol/L — ABNORMAL LOW (ref 3.5–5.1)
Sodium: 138 mmol/L (ref 135–145)

## 2019-07-31 LAB — GLUCOSE, CAPILLARY
Glucose-Capillary: 74 mg/dL (ref 70–99)
Glucose-Capillary: 127 mg/dL — ABNORMAL HIGH (ref 70–99)
Glucose-Capillary: 166 mg/dL — ABNORMAL HIGH (ref 70–99)

## 2019-07-31 MED ORDER — POTASSIUM CHLORIDE CRYS ER 20 MEQ PO TBCR
20.0000 meq | EXTENDED_RELEASE_TABLET | Freq: Once | ORAL | Status: AC
  Administered 2019-07-31: 20 meq via ORAL
  Filled 2019-07-31: qty 1

## 2019-07-31 MED ORDER — CIPROFLOXACIN HCL 500 MG PO TABS
500.0000 mg | ORAL_TABLET | Freq: Two times a day (BID) | ORAL | Status: DC
  Administered 2019-07-31: 500 mg via ORAL
  Filled 2019-07-31: qty 1

## 2019-07-31 NOTE — Discharge Summary (Signed)
Name: [name] MRN: [id number] DOB: August 21, 1975 44 y.o. PCP: [name], MD  Date of Admission: 07/29/2019 12:54 PM Date of Discharge:  Attending Physician: Sid Falcon, MD  Discharge Diagnosis: 1. Lower extremity edema  2. Chronic kidney diease stage 3 3. UTI  Discharge Medications: Allergies as of 07/31/2019      Reactions   Reglan [metoclopramide] Other (See Comments)   Dysphoric reaction      Medication List    TAKE these medications   Accu-Chek Guide Me w/Device Kit Use to check blood sugar up to 3 times a day   Accu-Chek Guide test strip Generic drug: glucose blood Check blood sugar up to 3 times a day   accu-chek softclix lancets Use to check blood sugars 5 times a day. Dx code:E11.9. Insulin dependent.   Accu-Chek Softclix Lancets lancets Check blood sugar up to 3 times a day   acidophilus Caps capsule Take 1 capsule by mouth daily.   allopurinol 100 MG tablet Commonly known as: ZYLOPRIM Take 2 tablets (200 mg total) by mouth daily.   Alpha-Lipoic Acid 600 MG Caps Take 600 mg by mouth daily.   amLODipine 10 MG tablet Commonly known as: NORVASC Take 10 mg by mouth at bedtime.   apixaban 5 MG Tabs tablet Commonly known as: Eliquis Take 1 tablet (5 mg total) by mouth 2 (two) times daily.   B-12 500 MCG Subl Place 500 mcg under the tongue daily.   calcitRIOL 0.5 MCG capsule Commonly known as: ROCALTROL Take 0.5 mcg by mouth daily.   carvedilol 25 MG tablet Commonly known as: COREG Take 1 tablet (25 mg total) by mouth 2 (two) times daily with a meal.   cloNIDine 0.3 MG tablet Commonly known as: CATAPRES Take 1 tablet (0.3 mg total) by mouth 2 (two) times daily.   famotidine 20 MG tablet Commonly known as: PEPCID Take 1 tablet (20 mg total) by mouth 2 (two) times daily. What changed:   when to take this  reasons to take this   Fish Oil 1000 MG Caps Take 1,000 mg by mouth at bedtime.   furosemide 80 MG tablet Commonly  known as: LASIX Take 1 tablet (80 mg total) by mouth 2 (two) times daily.   gabapentin 100 MG capsule Commonly known as: NEURONTIN Take 1 capsule (100 mg total) by mouth 3 (three) times daily.   hydrALAZINE 50 MG tablet Commonly known as: APRESOLINE Take 1 tablet by mouth 4 times daily What changed: See the new instructions.   insulin aspart 100 UNIT/ML FlexPen Commonly known as: NOVOLOG Inject 25 Units into the skin 3 (three) times daily with meals. 20 unit morning, 25 units lunch 30 units for dinner What changed:   how much to take  when to take this  additional instructions   lovastatin 20 MG tablet Commonly known as: MEVACOR Take 1 tablet (20 mg total) by mouth daily. What changed: when to take this   Melatonin 10 MG Tabs Take 10 mg by mouth at bedtime as needed (sleep).   metolazone 2.5 MG tablet Commonly known as: ZAROXOLYN Take 2.5 mg by mouth every other day.   ondansetron 4 MG disintegrating tablet Commonly known as: Zofran ODT Take 1 tablet (4 mg total) by mouth every 8 (eight) hours as needed for nausea or vomiting.   oxybutynin 5 MG tablet Commonly known as: DITROPAN Take 5 mg by mouth 3 (three) times daily.   Pen Needles 3/16" 31G X 5 MM Misc Use five  times daily   B-D UF III MINI PEN NEEDLES 31G X 5 MM Misc Generic drug: Insulin Pen Needle USE  4 TIMES DAILY   tamsulosin 0.4 MG Caps capsule Commonly known as: FLOMAX Take 0.4 mg by mouth at bedtime.   Tyler Aas FlexTouch 200 UNIT/ML FlexTouch Pen Generic drug: insulin degludec INJECT 70 UNITS SUBCUTANEOUSLY ONCE DAILY What changed: See the new instructions.   trimethoprim 100 MG tablet Commonly known as: TRIMPEX Take 100 mg by mouth at bedtime.   Victoza 18 MG/3ML Sopn Generic drug: liraglutide INJECT 1.2 MG (0.2 MLS) INTO THE SKIN ONCE DAILY AT THE SAME TIME.       Disposition and follow-up:   [name] was discharged from [facility] in Stable condition.  At  the hospital follow up visit please address:  1.  Chronic kidney disease - Follow up with [facility] after d/c  UTI - Finish course of Ciprofloxacin  2.  Labs / imaging needed at time of follow-up: none  3.  Pending labs/ test needing follow-up: none  Follow-up Appointments:   Hospital Course by problem list: 1. Hypervolemia Chronic kidney disease stage 3 Patient's kidney function has progresively worsened over the past few months. He states that he is compliant to his home regimen of 67m bid and metolazone 2.570mevery other day. This appears to be the sign of worsening of his CKD and not controlled with current home medication regimen. UR renal shows mildly echogenic renal parenchyma with no hydronephrosis or suspicious renal mass. Calcium and PTH are WNL. Nephrology is onboard and suspect Diabetic Nephropathy. Creatinine improves slightly. His home lasix and metolazone were resumed. [facility]      UTI Patient appears to have history of chronic recurrent UTIs, however we do not have complete records from his urologist. He was diagnosed with UTI four days prior and started on course of ciprofloxacin for seven days which he already finished 4 days. He had a cystoscopy done in 02/2018 reveals no obstruction or stricture or stones. UA inconclusive however has large leukocytes, many WBCs, with rare bacteria. Urine culture shows > 100k colonies of Gram negative rod. Ciprofloxacin were resumed and will be continued at home for a total of 7 days. Patient will follow up with [facility] urologist after discharge.    Discharge Vitals:   BP 139/64 (BP Location: Left Arm)   Pulse 79   Temp 98.5 F (36.9 C) (Oral)   Resp 18   Ht 6' 2"  (1.88 m)   Wt (!) 166.2 kg   SpO2 98%   BMI 47.04 kg/m   Pertinent Labs, Studies, and Procedures:  CBC Latest Ref Rng & Units 07/31/2019 07/30/2019 07/29/2019  WBC 4.0 - 10.5 K/uL 8.8 10.7(H)  9.9  Hemoglobin 13.0 - 17.0 g/dL 10.2(L) 10.4(L) 9.8(L)  Hematocrit 39 - 52 % 32.3(L) 33.5(L) 31.1(L)  Platelets 150 - 400 K/uL 318 318 309   BMP Latest Ref Rng & Units 07/31/2019 07/30/2019 07/29/2019  Glucose 70 - 99 mg/dL 116(H) 157(H) 212(H)  BUN 6 - 20 mg/dL 108(H) 112(H) 113(H)  Creatinine 0.61 - 1.24 mg/dL 4.86(H) 5.01(H) 5.16(H)  BUN/Creat Ratio 9 - 20 - - -  Sodium 135 - 145 mmol/L 138 136 134(L)  Potassium 3.5 - 5.1 mmol/L 3.2(L) 3.5 3.6  Chloride 98 - 111 mmol/L 101 99 98  CO2 22 - 32 mmol/L 25 26 25   Calcium 8.9 - 10.3 mg/dL 9.4 9.7 9.3  US RENAL  Result Date: 07/30/2019 CLINICAL DATA:  Chronic kidney disease. EXAM: RENAL / URINARY TRACT ULTRASOUND COMPLETE COMPARISON:  03/02/2018 FINDINGS: Right Kidney: Renal measurements: 12.8 x 6.7 x 6.3 centimeters = volume: 283.7 mL. Renal parenchyma is mildly echogenic. No mass or hydronephrosis visualized. Left Kidney: Renal measurements: 13.5 x 6.2 x 7.0 centimeters = volume: 309.5 mL. Renal parenchyma is mildly echogenic. No mass or hydronephrosis visualized. Bladder: Appears normal for degree of bladder distention. Other: Study quality is degraded by patient body habitus. IMPRESSION: 1. Mildly echogenic renal parenchyma. 2. No hydronephrosis or suspicious renal mass. Electronically Signed   By: Nolon Nations M.D.   On: 07/30/2019 16:21   DG Chest Port 1 View  Result Date: 07/30/2019 CLINICAL DATA:  Fluid overload EXAM: PORTABLE CHEST 1 VIEW COMPARISON:  May 21, 2019 FINDINGS: Again noted is cardiomegaly. Overall shallow degree of aeration. Prominence of the central pulmonary vasculature is noted. No large airspace consolidation. No acute osseous abnormality. IMPRESSION: Cardiomegaly and pulmonary vascular congestion. Electronically Signed   By: Prudencio Pair M.D.   On: 07/30/2019 01:17   Discharge Instructions: Discharge Instructions    Call MD for:  persistant dizziness or light-headedness   Complete by: As directed    Call MD for:   severe uncontrolled pain   Complete by: As directed    Diet - low sodium heart healthy   Complete by: As directed    Discharge instructions   Complete by: As directed    [name],  It was a pleasure taking care of you during this admission. Please follow up with [facility] in a week.  Please follow up with the Internal Medicine clinic.  Please continue 2 more days of your antibiotic Ciprofloxacin for the urinary tract infection. Please resume all of your home medications.  Take care, Victor Gerold, DO   Increase activity slowly   Complete by: As directed       Signed: Gaylan Gerold, DO 07/31/2019, 3:21 PM   Pager: [phone]

## 2019-07-31 NOTE — Hospital Course (Signed)
Hypervolemia Chronic kidney disease stage 3 Patient's kidney function has progresively worsened over the past few months. He states that he is compliant to his home regimen of 80mg  bid and metolazone 2.5mg  every other day. This appears to be the sign of worsening of his CKD and not controlled with current home medication regimen. UR renal shows mildly echogenic renal parenchyma with no hydronephrosis or suspicious renal mass. Calcium and PTH are WNL. Nephrology is onboard and suspect Diabetic Nephropathy. Creatinine improves slightly. His home lasix and metolazone were resumed. Patient will follow up with [facility] in 1 week.      UTI Patient appears to have history of chronic recurrent UTIs, however we do not have complete records from his urologist. He was diagnosed with UTI four days prior and started on course of ciprofloxacin for seven days which he already finished 4 days. He had a cystoscopy done in 02/2018 reveals no obstruction or stricture or stones. UA inconclusive however has large leukocytes, many WBCs, with rare bacteria. Urine culture shows > 100k colonies of Gram negative rod. Ciprofloxacin were resumed and will be continued at home for a total of 7 days. Patient will follow up with [facility] urologist after discharge.

## 2019-07-31 NOTE — Progress Notes (Signed)
Subjective:   Hospital day: 1  Overnight event: No  Patient is seen at bedside today. He appears in no acute distress and has no complain. He states that he has been urinating a lot and his LE edema has improved. Patient states that the surgeon used a wrap for his leg and that did help with his swelling.    Objective:  Vital signs in last 24 hours: Vitals:   07/30/19 1400 07/30/19 1404 07/30/19 2038 07/31/19 0500  BP:  (!) 143/84 133/65 132/65  Pulse:  80 84 76  Resp:  18 20 18   Temp:  98.6 F (37 C) 98.6 F (37 C) 98 F (36.7 C)  TempSrc:  Oral Oral Oral  SpO2:  100% 97% 95%  Weight: (!) 166.2 kg     Height: 6\' 2"  (1.88 m)       Physical Exam  Physical Exam Constitutional:      General: He is not in acute distress.    Appearance: He is not toxic-appearing.  HENT:     Head: Normocephalic.  Eyes:     General: No scleral icterus.    Conjunctiva/sclera: Conjunctivae normal.  Cardiovascular:     Rate and Rhythm: Normal rate and regular rhythm.     Heart sounds: Normal heart sounds. No murmur heard.   Pulmonary:     Effort: Pulmonary effort is normal. No respiratory distress.     Comments: Mild diminished breath sound bilateral lung bases Abdominal:     General: Bowel sounds are normal.     Palpations: Abdomen is soft.     Tenderness: There is no abdominal tenderness.  Musculoskeletal:        General: Normal range of motion.     Cervical back: Normal range of motion.     Left lower leg: Edema (+1-2 ) present.  Skin:    General: Skin is warm.     Coloration: Skin is not jaundiced.  Neurological:     Mental Status: He is alert.  Psychiatric:        Mood and Affect: Mood normal.        Behavior: Behavior normal.     Assessment/Plan: [name] is a 44 year old man with PMH of CKD Stage 3, HFpEF (last EF 60-65%), T2DM (last HbA1c - 6.5), HTN, DVT/PE (on Eliquis), and HLD who presented to the ED with complaints ofleg swelling found to have hypervolemiain  the setting ofworsening CKD. Nephrology consulted.   Active Problems:   Hypervolemia associated with renal insufficiency   Acute renal failure with acute tubular necrosis superimposed on stage 4 chronic kidney disease (HCC)  Hypervolemia Chronic kidney disease stage 3 Patient's kidney function has progresivelyworsened over the past few months. He states that he is compliant to his homeregimen of80mg  bid and metolazone 2.5mg  every other day. This appears to be the sign of worsening of his CKD and not controlled with current home medication regimen. UR renal shows mildly echogenic renal parenchyma with no hydronephrosis or suspicious renal mass. Calcium and PTH are WNL. Nephrology is onboard and suspect Diabetic Nephropathy. Creatinine improves slightly today 5.01 - 4.86.  - Continuehome orallasix80mg  bid. - Continue home metolazone 2.5mg  every other day - Monitor BMP daily  - Daily weights - Renal diet - Strict I's and O's.  - Pending urine albumin/Cr and Protein/Cr ratio   UTI Patientappears to have history of chronic recurrent UTIs, however we do not have complete records from his urologist. He wasdiagnosed with UTI four days prior and  started on course of ciprofloxacin for seven days which he already finished 4 days. He had a cystoscopy done in 02/2018 reveals no obstruction or stricture or stones. UA inconclusive however has large leukocytes, many WBCs,with rare bacteria. Urine culture shows > 100k colonies of Gram negative rod.  - Resume ciprofloxacin today (1/2) - Patient is supposed to have an appointment with [facility] urologist today. Will set up another follow up appointment with them to further evaluate the cause of recurrent UTI.    Anemia 2/2 CKD His Hgb baseline ~ 9 - 10.  - Continue monitor CBC   T2DM Last HbA1c 6.5 in June 2021. Home regimen: victoza, 90 units Tresiba Qd and Novolog 15 units at breakfast and 20-25 units at lunch and dinner.  - SSIresistant  scale - Lantus 30 units daily  - Monitor CBG   HTN Resume home BP meds. Current BP 127/59 - Continue home amlodipine 10mg  - Continue homecarvedilol 25mg  bid - Continue home clonidine 0.3mg  bid - Continue home hydralazine 50mg  qid - Continue home lasix 80mg  bid - Continue home metolazone 2.5mg  every other day   History of DVT/PE - Continue Apixaban   ANV:BTYO modified VTE ppx: Eliquis CODE STATUS: Full  Prior to Admission Living Arrangement: home Anticipated Discharge Location: home Barriers to Discharge: Nephrology consultation  Dispo: Anticipated discharge in approximately 1 day(s).    Gaylan Gerold, DO 07/31/2019, 6:13 AM Pager: [phone] After 5pm on weekdays and 1pm on weekends: On Call pager [phone]

## 2019-07-31 NOTE — Plan of Care (Signed)
  Problem: Education: Goal: Knowledge of disease and its progression will improve Outcome: Progressing   Problem: Respiratory: Goal: Respiratory symptoms related to disease process will be avoided Outcome: Progressing   Problem: Coping: Goal: Level of anxiety will decrease Outcome: Progressing   Problem: Elimination: Goal: Will not experience complications related to bowel motility Outcome: Progressing

## 2019-07-31 NOTE — Progress Notes (Signed)
[facility] Progress Note    Assessment:   1. AKI on CKD stage III (nonoliguric), underlying CKD can be secondary to diabetic nephropathy (historically microalbuminuric) and possibly obesity related FSGS (has had nephrotic range proteinuria).  Current rising creatinine is currently questionable but can be due to progression of CKD versus decreased effective arterial blood flow in the setting of renovascular congestion.  We will monitor him and see how his renal function does with diuresis, slightly better today 2. Hypervolemia, worsening leg swelling 3. Hypertension 4. History of DVT/PE (on Eliquis) 5. History of right AKA 6. HFpEF, 7. Diabetes mellitus 8. Anemia of chronic renal disease, normocytic.  Stable  PLAN: -Favoring that he has more so progression of CKD and he is likely stage V at this point -No indication currently for renal replacement therapy -Had an extensive discussion about CKD and his inevitable need for dialysis in the near future.  Discussed about transplant, resources given -Continue with Lasix 80 mg twice daily metolazone 2.5 mg daily -Ace wrap for left lower extremity (for compression) -Renal ultrasound reviewed -follow up planned for next week with Dr. Joelyn Oms -Avoid nephrotoxic medications including NSAIDs and iodinated intravenous contrast exposure unless the latter is absolutely indicated.  Preferred narcotic agents for pain control are hydromorphone, fentanyl, and methadone. Morphine should not be used. Avoid Baclofen and avoid oral sodium phosphate and magnesium citrate based laxatives / bowel preps. Continue strict Input and Output monitoring. Will monitor the patient closely with you and intervene or adjust therapy as indicated by changes in clinical status/labs   Subjective:   Feels better, swelling better.  He reports that he is not ready to go home and requesting Ace wrap's or compression stocking for his left lower extremity edema.  Denies  any nausea/vomiting, dysgeusia, loss of appetite, shortness of breath, orthopnea.   Objective:   BP 139/64 (BP Location: Left Arm)   Pulse 79   Temp 98.5 F (36.9 C) (Oral)   Resp 18   Ht 6\' 2"  (1.88 m)   Wt (!) 166.2 kg   SpO2 98%   BMI 47.04 kg/m   Intake/Output Summary (Last 24 hours) at 07/31/2019 1448 Last data filed at 07/31/2019 1200 Gross per 24 hour  Intake 660 ml  Output 4200 ml  Net -3540 ml   Weight change: 2.905 kg  Physical Exam: Gen: No acute distress, sitting up in bed CVS: Regular rate, S1-S2 Resp: Clear to auscultation bilaterally, speaking full sentences, bilateral chest expansion, unlabored Abd: Obese, soft Ext: Right AKA, left lower extremity: 3+ pitting edema Neuro: Alert and orient x3, speech clear and coherent, moves all extremity spontaneously, no asterixis  Imaging: US RENAL  Result Date: 07/30/2019 CLINICAL DATA:  Chronic kidney disease. EXAM: RENAL / URINARY TRACT ULTRASOUND COMPLETE COMPARISON:  03/02/2018 FINDINGS: Right Kidney: Renal measurements: 12.8 x 6.7 x 6.3 centimeters = volume: 283.7 mL. Renal parenchyma is mildly echogenic. No mass or hydronephrosis visualized. Left Kidney: Renal measurements: 13.5 x 6.2 x 7.0 centimeters = volume: 309.5 mL. Renal parenchyma is mildly echogenic. No mass or hydronephrosis visualized. Bladder: Appears normal for degree of bladder distention. Other: Study quality is degraded by patient body habitus. IMPRESSION: 1. Mildly echogenic renal parenchyma. 2. No hydronephrosis or suspicious renal mass. Electronically Signed   By: Nolon Nations M.D.   On: 07/30/2019 16:21   DG Chest Port 1 View  Result Date: 07/30/2019 CLINICAL DATA:  Fluid overload EXAM: PORTABLE CHEST 1 VIEW COMPARISON:  May 21, 2019 FINDINGS: Again noted  is cardiomegaly. Overall shallow degree of aeration. Prominence of the central pulmonary vasculature is noted. No large airspace consolidation. No acute osseous abnormality. IMPRESSION:  Cardiomegaly and pulmonary vascular congestion. Electronically Signed   By: Prudencio Pair M.D.   On: 07/30/2019 01:17    Labs: BMET Recent Labs  Lab 07/29/19 1313 07/30/19 0332 07/30/19 1426 07/31/19 0422  NA 134* 136  --  138  K 3.6 3.5  --  3.2*  CL 98 99  --  101  CO2 25 26  --  25  GLUCOSE 212* 157*  --  116*  BUN 113* 112*  --  108*  CREATININE 5.16* 5.01*  --  4.86*  CALCIUM 9.3 9.7  --  9.4  PHOS  --   --  5.0* 4.7*   CBC Recent Labs  Lab 07/29/19 1313 07/30/19 0332 07/31/19 0422  WBC 9.9 10.7* 8.8  NEUTROABS  --   --  5.5  HGB 9.8* 10.4* 10.2*  HCT 31.1* 33.5* 32.3*  MCV 91.2 89.6 90.0  PLT 309 318 318    Medications:    . amLODipine  10 mg Oral QHS  . apixaban  5 mg Oral BID  . calcitRIOL  0.5 mcg Oral Daily  . carvedilol  25 mg Oral BID WC  . ciprofloxacin  500 mg Oral BID  . cloNIDine  0.3 mg Oral BID  . furosemide  80 mg Oral BID  . gabapentin  100 mg Oral TID  . hydrALAZINE  50 mg Oral QID  . insulin aspart  0-20 Units Subcutaneous TID WC  . insulin glargine  30 Units Subcutaneous QHS  . metolazone  2.5 mg Oral QODAY  . multivitamin with minerals  1 tablet Oral Daily  . sodium chloride flush  3 mL Intravenous Once  . sodium chloride flush  3 mL Intravenous Q12H  . tamsulosin  0.4 mg Oral QHS      Gean Quint, MD [facility] 07/31/2019, 2:48 PM

## 2019-07-31 NOTE — Progress Notes (Signed)
Orthopedic Tech Progress Note Patient Details:  Ustin Cruickshank 06-14-1975 [id number]  Ortho Devices Type of Ortho Device: Haematologist Ortho Device/Splint Location: Left Lower Extremity Ortho Device/Splint Interventions: Ordered, Application   Post Interventions Patient Tolerated: Fair Instructions Provided: Adjustment of device, Care of device, Poper ambulation with device   Tammy Sours 07/31/2019, 4:38 PM

## 2019-08-01 ENCOUNTER — Other Ambulatory Visit: Payer: Self-pay

## 2019-08-01 NOTE — Consult Note (Addendum)
   [facility] CM Inpatient Consult   08/01/2019  [name] [birth year] [id number]  [name] patient:  [name] referral request and assigned this patient for outreach. Referred to:  [facility] Internal Medicine Chronic Care Management for HF follow up.  Primary Care Provider:  [name], MD Embedded [facility] Internal Medicine  [name], RN BSN [facility] Liaison  [phone] business mobile phone Toll free office [phone]  Fax number: [phone] [email or web address]

## 2019-08-04 ENCOUNTER — Encounter: Payer: Medicare Other | Admitting: Dietician

## 2019-08-05 DIAGNOSIS — E113211 Type 2 diabetes mellitus with mild nonproliferative diabetic retinopathy with macular edema, right eye: Secondary | ICD-10-CM | POA: Diagnosis not present

## 2019-08-07 ENCOUNTER — Encounter: Payer: Self-pay | Admitting: Dietician

## 2019-08-07 ENCOUNTER — Ambulatory Visit (INDEPENDENT_AMBULATORY_CARE_PROVIDER_SITE_OTHER): Payer: Medicare Other | Admitting: Dietician

## 2019-08-07 DIAGNOSIS — Z794 Long term (current) use of insulin: Secondary | ICD-10-CM | POA: Diagnosis not present

## 2019-08-07 DIAGNOSIS — E119 Type 2 diabetes mellitus without complications: Secondary | ICD-10-CM

## 2019-08-07 NOTE — Progress Notes (Addendum)
Medical Nutrition Therapy Via Telemedicine (Telephone):  Appt start time: 7169 end time:  1605. Total time: 35  Telephone visit due to COVID-19. I connected with  [name] on 08/07/19 by a  telephone and verified that I am speaking with the correct person using two identifiers.   Patient [name]) Location: Home  Provider [name] Sahmir Weatherbee)Location: Office    I discussed the limitations of evaluation and management by telemedicine. The patient expressed understanding and agreed to proceed.  Assessment: [name] would like help with weight loss and 'clean eating'. He explains that he is no longer candidate for  Bariatric surgery, but still has the goal of weight loss so he can walk. By clean eating he says he means :No junk, healthy snacks and healthy meals no red meat, no pork". He denies changes to medicine since his recent hospitalization. He has not been weighed, but states he feels he is retaining fluid. He was unable to name barriers to his goals of clean eating and weight loss. He asks for help in learning about portions and calories in foods and more about how to incorporate alternative protein sources.  His schedule is as follows:   Upon waking takes 19 tresiba and 0.6 mg victoza(it is increasing back to his usual dose) , Sat- Sunday he tkes novolog 20 units with breakfast but not on work out days. He sometimes works ot on saturdays 430 am Raisin bran 1 cup, 2 % milk- less than cup, 8 oz glass of oj (Monday-Friday) (~7-10 g protein) Gym, 7am- 740 stays on treadmill 830-845 am  Sip of water or gatorade zero, sometimes Human resources officer or gatorade zero  [phone] am at home 3 whites 1 yolk eggs, [place] bacon x 2, grits- instant grits regular, protein shake frozen fruit, almond milk, protein powder ran out today (~30 g protein) 1230 pm- takes 25 units of Novolog and eats  half  tuna(light) sandwich carrot sticks (~ 1 small can /day)  4 pm half tuna salad sandwich carrots stick  (~26 g protein) 7 pm-takes 30 units Novolog and eats grilled chicken 8 0z (~65g protein) diced spring mix, baby spinach, cukes, dried cranberry mix, dressing vinaigrette croutons or wonton's, dessert- frozen fruit sorbet- fruit and oj ; sf vanilla wafers, oreos-thin 3-4 before bed Bedtime 10-11 pm denies problems sleeping; then says should be using a CPAP and sleeps like a baby when he does- someone is working on getting him month  Wt Readings from Last 5 Encounters:  07/30/19 (!) 366 lb 6.5 oz (166.2 kg)  07/24/19 (!) 360 lb (163.3 kg)  07/02/19 (!) 360 lb (163.3 kg)  06/03/19 (!) 366 lb 3.2 oz (166.1 kg)  05/25/19 (!) 374 lb 1.9 oz (169.7 kg)   Lab Results  Component Value Date   HGBA1C 6.5 (H) 07/03/2019   HGBA1C 7.3 (A) 04/18/2019   HGBA1C 7.1 (A) 01/01/2019   HGBA1C 6.9 (A) 09/19/2018   HGBA1C 7.8 (A) 06/18/2018     Outcome goal:Lose weight (has not weighed) and "eat clean" which to him means "no junk" and provide the nutrients his body needs   Nutritional Diagnosis:  NB-1.1 Food and nutrition-related knowledge deficit As related to lack of sufficient prior exposure to diabetes and renal meal planning  As evidenced by his questions and report.  Progress Towards Goal(s): Unknown at this time.                         Intervention:  Educated about benefits of CGM, protein and sodium restrictions of renal diet  Monitoring and Evaluation: weight, intake, blood sugars: follow up 1 week per patient request;  Consider checking vitamin D Debera Lat, RD 08/07/2019 5:11 PM.

## 2019-08-08 ENCOUNTER — Other Ambulatory Visit: Payer: Self-pay | Admitting: Dietician

## 2019-08-08 DIAGNOSIS — Z794 Long term (current) use of insulin: Secondary | ICD-10-CM

## 2019-08-08 DIAGNOSIS — E119 Type 2 diabetes mellitus without complications: Secondary | ICD-10-CM

## 2019-08-08 NOTE — Patient Instructions (Signed)
Hi Bobak,   It was good to talk to you! I appreciate you being honest about what you know and need to learn regarding food as this helps me help you!  Here is some information about keeping a food record:    Writing down what we eat and drink can help [place] to see it in a different way. It may help [place] to change habits. What we eat and drink are habits formed during our lifetime. Habits are learned and can be unlearned and changed.   Please write down the foods and beverages you have during the day. Please include at least the time you eat and drink, what you eat and drink and how much you eat and drink. Includes as much detail as you can.  I suggest you eventually  get about 3 different days food record to be able to get a really good idea of what your "usual" intake is like.   For example:  Time   What    How much 8 AM  Cooked oatmeal   1 Cup   Coffee     1 mug                         Sugar in oatmeal   1 teaspoon         Sugar in coffee   2 teaspoons   Milk in oatmeal    1/4 cup   Creamer in coffee    2 teaspoons   Margarine in oatmeal   1 teaspoon   See you next week!

## 2019-08-11 ENCOUNTER — Encounter (HOSPITAL_COMMUNITY): Payer: Medicare Other

## 2019-08-12 ENCOUNTER — Encounter (HOSPITAL_COMMUNITY): Payer: Medicare Other

## 2019-08-12 DIAGNOSIS — E669 Obesity, unspecified: Secondary | ICD-10-CM | POA: Diagnosis not present

## 2019-08-12 DIAGNOSIS — N185 Chronic kidney disease, stage 5: Secondary | ICD-10-CM | POA: Diagnosis not present

## 2019-08-12 DIAGNOSIS — N2581 Secondary hyperparathyroidism of renal origin: Secondary | ICD-10-CM | POA: Diagnosis not present

## 2019-08-12 DIAGNOSIS — I120 Hypertensive chronic kidney disease with stage 5 chronic kidney disease or end stage renal disease: Secondary | ICD-10-CM | POA: Diagnosis not present

## 2019-08-14 ENCOUNTER — Ambulatory Visit (INDEPENDENT_AMBULATORY_CARE_PROVIDER_SITE_OTHER): Payer: Medicare Other | Admitting: Dietician

## 2019-08-14 ENCOUNTER — Ambulatory Visit (HOSPITAL_COMMUNITY)
Admission: RE | Admit: 2019-08-14 | Discharge: 2019-08-14 | Disposition: A | Discharge status: Home / Self Care | Payer: Medicare Other | Source: Ambulatory Visit | Attending: Nephrology | Admitting: Nephrology

## 2019-08-14 ENCOUNTER — Ambulatory Visit: Payer: Medicare Other | Admitting: Dietician

## 2019-08-14 ENCOUNTER — Other Ambulatory Visit: Payer: Self-pay

## 2019-08-14 VITALS — BP 162/83 | HR 98 | Temp 97.6°F | Resp 18

## 2019-08-14 DIAGNOSIS — D631 Anemia in chronic kidney disease: Secondary | ICD-10-CM | POA: Insufficient documentation

## 2019-08-14 DIAGNOSIS — N183 Chronic kidney disease, stage 3 unspecified: Secondary | ICD-10-CM | POA: Diagnosis not present

## 2019-08-14 DIAGNOSIS — E119 Type 2 diabetes mellitus without complications: Secondary | ICD-10-CM | POA: Diagnosis not present

## 2019-08-14 DIAGNOSIS — Z794 Long term (current) use of insulin: Secondary | ICD-10-CM | POA: Diagnosis not present

## 2019-08-14 LAB — POCT HEMOGLOBIN-HEMACUE: Hemoglobin: 10.4 g/dL — ABNORMAL LOW (ref 13.0–17.0)

## 2019-08-14 LAB — RENAL FUNCTION PANEL
Albumin: 3.6 g/dL (ref 3.5–5.0)
Anion gap: 15 (ref 5–15)
BUN: 107 mg/dL — ABNORMAL HIGH (ref 6–20)
CO2: 24 mmol/L (ref 22–32)
Calcium: 10 mg/dL (ref 8.9–10.3)
Chloride: 97 mmol/L — ABNORMAL LOW (ref 98–111)
Creatinine, Ser: 5.3 mg/dL — ABNORMAL HIGH (ref 0.61–1.24)
GFR calc Af Amer: 14 mL/min — ABNORMAL LOW (ref >60)
GFR calc non Af Amer: 12 mL/min — ABNORMAL LOW (ref >60)
Glucose, Bld: 80 mg/dL (ref 70–99)
Phosphorus: 5.7 mg/dL — ABNORMAL HIGH (ref 2.5–4.6)
Potassium: 3.2 mmol/L — ABNORMAL LOW (ref 3.5–5.1)
Sodium: 136 mmol/L (ref 135–145)

## 2019-08-14 LAB — IRON AND TIBC
Iron: 36 ug/dL — ABNORMAL LOW (ref 45–182)
Saturation Ratios: 13 % — ABNORMAL LOW (ref 17.9–39.5)
TIBC: 284 ug/dL (ref 250–450)
UIBC: 248 ug/dL

## 2019-08-14 LAB — FERRITIN: Ferritin: 401 ng/mL — ABNORMAL HIGH (ref 24–336)

## 2019-08-14 MED ORDER — DARBEPOETIN ALFA 100 MCG/0.5ML IJ SOSY
PREFILLED_SYRINGE | INTRAMUSCULAR | Status: AC
  Filled 2019-08-14: qty 0.5

## 2019-08-14 MED ORDER — DARBEPOETIN ALFA 100 MCG/0.5ML IJ SOSY
100.0000 ug | PREFILLED_SYRINGE | INTRAMUSCULAR | Status: DC
  Administered 2019-08-14: 100 ug via SUBCUTANEOUS

## 2019-08-14 NOTE — Patient Instructions (Signed)
Let me know about making a food shopping list or nay other recipes, snacks.   [name] [phone]

## 2019-08-14 NOTE — Progress Notes (Signed)
Medical Nutrition Therapy Via Telemedicine (Telephone):  Appt start time: 3500 end time:  9381. Total time: 58  [name] presents for medical nutrition therapy for his diabetes and chronic kidney diease. We had planned to weigh but he deferred this because he did not bring his prosthesis. We discussed nutrition myths, concentrating on lower protein and sodium foods with possible phosphorus restriction being needed.  He denies feeling depressed, and says he is more determined.  Offered a sample CGM which may assist him in coordinating the insulin to his food better and "seeing" what foods do to his blood sugar to help him achieve his goal of safe weight loss as well as . He deferred at this time.   Estimated body mass index is 47.04 kg/m as calculated from the following:   Height as of 07/30/19: 6\' 2"  (1.88 m).   Weight as of 07/30/19: 366 lb 6.5 oz (166.2 kg).  Labs-  CMP Latest Ref Rng & Units 08/14/2019 07/31/2019 07/30/2019  Glucose 70 - 99 mg/dL 80 116(H) 157(H)  BUN 6 - 20 mg/dL 107(H) 108(H) 112(H)  Creatinine 0.61 - 1.24 mg/dL 5.30(H) 4.86(H) 5.01(H)  Sodium 135 - 145 mmol/L 136 138 136  Potassium 3.5 - 5.1 mmol/L 3.2(L) 3.2(L) 3.5  Chloride 98 - 111 mmol/L 97(L) 101 99  CO2 22 - 32 mmol/L 24 25 26   Calcium 8.9 - 10.3 mg/dL 10.0 9.4 9.7  Total Protein 6.5 - 8.1 g/dL - - -  Total Bilirubin 0.3 - 1.2 mg/dL - - -  Alkaline Phos 38 - 126 U/L - - -  AST 15 - 41 U/L - - -  ALT 0 - 44 U/L - - -    Outcome goal:Lose weight to reduce BMI to 35  Nutritional Diagnosis:  NB-1.1 Food and nutrition-related knowledge deficit As related to lack of sufficient prior exposure to diabetes and renal meal planning  As evidenced by his questions and report.  Progress Towards Goal(s): some progress                         Intervention:  Educated about benefits of CGM, protein and sodium restrictions of renal diet. Recipes and 7 day 2000 calorie renal meal plan provided Coordination of care- consider  Ozempic for additional weight loss and blood glucose lowering effects  Monitoring and Evaluation: weight, intake, blood sugars: follow up 1 week per patient request;  Consider checking vitamin D Debera Lat, RD 08/14/2019 4:00 PM.

## 2019-08-15 LAB — PTH, INTACT AND CALCIUM
Calcium, Total (PTH): 10.1 mg/dL (ref 8.7–10.2)
PTH: 13 pg/mL — ABNORMAL LOW (ref 15–65)

## 2019-08-19 ENCOUNTER — Ambulatory Visit: Payer: Medicare Other | Admitting: *Deleted

## 2019-08-19 DIAGNOSIS — R350 Frequency of micturition: Secondary | ICD-10-CM | POA: Diagnosis not present

## 2019-08-19 DIAGNOSIS — E785 Hyperlipidemia, unspecified: Secondary | ICD-10-CM

## 2019-08-19 DIAGNOSIS — N5201 Erectile dysfunction due to arterial insufficiency: Secondary | ICD-10-CM | POA: Diagnosis not present

## 2019-08-19 DIAGNOSIS — I10 Essential (primary) hypertension: Secondary | ICD-10-CM

## 2019-08-19 DIAGNOSIS — E119 Type 2 diabetes mellitus without complications: Secondary | ICD-10-CM

## 2019-08-19 DIAGNOSIS — Z794 Long term (current) use of insulin: Secondary | ICD-10-CM

## 2019-08-19 NOTE — Chronic Care Management (AMB) (Signed)
  Chronic Care Management   Note  08/19/2019 Name: [name] MRN: [id number] DOB: 17-Apr-1975   Successful outreach to patient to explain chronic care management program. He is interested but wishes to complete initial telephone intake tomorrow.   Follow up plan: Telephone follow up appointment with care management team member scheduled for:08/20/19 at 4:15 pm  Kelli Churn RN, CCM, [facility] RN Care Manager [phone]

## 2019-08-20 ENCOUNTER — Ambulatory Visit: Payer: Medicare Other | Admitting: *Deleted

## 2019-08-20 DIAGNOSIS — Z794 Long term (current) use of insulin: Secondary | ICD-10-CM

## 2019-08-20 DIAGNOSIS — E119 Type 2 diabetes mellitus without complications: Secondary | ICD-10-CM

## 2019-08-20 DIAGNOSIS — E785 Hyperlipidemia, unspecified: Secondary | ICD-10-CM

## 2019-08-20 DIAGNOSIS — I10 Essential (primary) hypertension: Secondary | ICD-10-CM

## 2019-08-20 DIAGNOSIS — S78111A Complete traumatic amputation at level between right hip and knee, initial encounter: Secondary | ICD-10-CM

## 2019-08-20 DIAGNOSIS — I5032 Chronic diastolic (congestive) heart failure: Secondary | ICD-10-CM

## 2019-08-20 DIAGNOSIS — N183 Chronic kidney disease, stage 3 unspecified: Secondary | ICD-10-CM

## 2019-08-21 ENCOUNTER — Other Ambulatory Visit: Payer: Self-pay

## 2019-08-21 ENCOUNTER — Ambulatory Visit (INDEPENDENT_AMBULATORY_CARE_PROVIDER_SITE_OTHER): Payer: Medicare Other | Admitting: Dietician

## 2019-08-21 ENCOUNTER — Other Ambulatory Visit: Payer: Self-pay | Admitting: Internal Medicine

## 2019-08-21 DIAGNOSIS — E119 Type 2 diabetes mellitus without complications: Secondary | ICD-10-CM

## 2019-08-21 DIAGNOSIS — Z794 Long term (current) use of insulin: Secondary | ICD-10-CM | POA: Diagnosis not present

## 2019-08-21 NOTE — Progress Notes (Signed)
Medical Nutrition Therapy Via Telemedicine (Telephone):  Appt start time: 1550 end time:  5883 Total time: 26  Telephone visit due to COVID-19.  This is a telephone encounter between [name]  and [name] Romelo Sciandra  on 08/21/2019 for Medical Nutrition Therapy . The visit was conducted with the patient located at home and Debera Lat  at [facility]. The patient's identity was confirmed using their DOB and current address. The patient has consented to being evaluated through a telephone encounter and understands the associated risks / benefits (allows the patient to remain at home, decreasing exposure to coronavirus). I personally spent 26 minutes on medical nutrition therapy discussion.     Patient and/or legal guardian consented to telephone visit: yes   Mr Mobley and I met on the phone and discussed the following:  We deferred his Foot exam, weight and blood pressure. He reports he is waiting on Dr. Radford Pax to help him with obtaining a CPAP, but he doe snot think he is sleeping poorly. He states that he sleeps at least 6 hours and wakes feeling energized. He agreed to reconsider a CGM sample to assist in blood sugar control. He did not have blood sugars to discuss as he left his meter in brother's car. Reinforced allowable fruits and vegetable son kidney friendly meal plan. He was abel to identify through motivational interviewing that  temptations at special occasions and eating out are barriers to him achieving his goals.   Estimated protein needs 110-130 grams protein /day Meal plan: ~ 110-125 grams protein  3 egg whites;  Grits = 20-25 grams 4 oz chicken, 3 veg, 3 starch = 40 grams 4 oz chicken, 3 veg, 3 starch = 40 grams 2 oz prot, 2 starch = 20 grams Fruits 3 per day and healthy fat as needed.    Outcome goal:Lose weight to reduce BMI to 35  Nutritional Diagnosis:  NB-1.1 Food and nutrition-related knowledge deficit As related to lack of sufficient prior exposure to diabetes and renal meal  planning gradually improving  As evidenced by his questions and report.  Progress Towards Goal(s): some progress                         Intervention:  Re educated about benefits of CGM, protein and sodium, phosphorus  restrictions of renal diet. Motivational interviewing to assist him in addressing his ambivalence and barriers Plan- ideas and recipes for fruits, vegetables, special occassions and eating out on kidney friendly meal plan.  Coordination of care: consider increased dose of Victoza and CGM to assist him with weight loss  Monitoring and Evaluation: weight, intake, blood sugars: follow up 1 week per patient request;  Consider checking vitamin D Debera Lat, RD 08/21/2019 4:55 PM.

## 2019-08-21 NOTE — Progress Notes (Signed)
Internal Medicine Clinic Resident  I have personally reviewed this encounter including the documentation in this note and/or discussed this patient with the care management provider. I will address any urgent items identified by the care management provider and will communicate my actions to the patient's PCP. I have reviewed the patient's CCM visit with my supervising attending, Dr Heber [place].  Sanjuana Letters, MD 08/21/2019

## 2019-08-21 NOTE — Chronic Care Management (AMB) (Signed)
Chronic Care Management   Initial Visit Note  08/21/2019 Name: [name] MRN: [id number] DOB: March 03, 1975  Referred by: [name], MD Reason for referral : Chronic Care Management (IDDM, HF, HLD, HTN, )   [name] is a 44 y.o. year old male who is a primary care patient of [name], MD. The CCM team was consulted for assistance with chronic disease management and care coordination needs related to CHF, HTN, HLD, DMII and CKD Stage 3  Review of patient status, including review of consultants reports, relevant laboratory and other test results, and collaboration with appropriate care team members and the patient's provider was performed as part of comprehensive patient evaluation and provision of chronic care management services.    SDOH (Social Determinants of Health) assessments performed: Yes See Care Plan activities for detailed interventions related to SDOH  SDOH Interventions     Most Recent Value  SDOH Interventions  Food Insecurity Interventions Intervention Not Indicated  Financial Strain Interventions Intervention Not Indicated  Housing Interventions Intervention Not Indicated  Physical Activity Interventions Intervention Not Indicated  Stress Interventions Intervention Not Indicated  Social Connections Interventions Intervention Not Indicated  Transportation Interventions Other (Comment)  [discussed Medicaid transportation option- currently patient not interested]       Medications: Outpatient Encounter Medications as of 08/20/2019  Medication Sig  . Accu-Chek Softclix Lancets lancets Check blood sugar up to 3 times a day  . acidophilus (RISAQUAD) CAPS capsule Take 1 capsule by mouth daily.  Marland Kitchen allopurinol (ZYLOPRIM) 100 MG tablet Take 2 tablets (200 mg total) by mouth daily.  . Alpha-Lipoic Acid 600 MG CAPS Take 600 mg by mouth daily.  Marland Kitchen amLODipine (NORVASC) 10 MG tablet Take 10 mg by mouth at bedtime.  Marland Kitchen apixaban (ELIQUIS) 5 MG TABS tablet Take 1  tablet (5 mg total) by mouth 2 (two) times daily.  . Blood Glucose Monitoring Suppl (ACCU-CHEK GUIDE ME) w/Device KIT Use to check blood sugar up to 3 times a day  . calcitRIOL (ROCALTROL) 0.5 MCG capsule Take 0.5 mcg by mouth daily.  . carvedilol (COREG) 25 MG tablet Take 1 tablet (25 mg total) by mouth 2 (two) times daily with a meal.  . cloNIDine (CATAPRES) 0.3 MG tablet Take 1 tablet (0.3 mg total) by mouth 2 (two) times daily.  . Cyanocobalamin (B-12) 500 MCG SUBL Place 500 mcg under the tongue daily.   . famotidine (PEPCID) 20 MG tablet Take 1 tablet (20 mg total) by mouth 2 (two) times daily. (Patient taking differently: Take 20 mg by mouth 2 (two) times daily as needed for heartburn or indigestion. )  . furosemide (LASIX) 80 MG tablet Take 1 tablet (80 mg total) by mouth 2 (two) times daily.  Marland Kitchen gabapentin (NEURONTIN) 100 MG capsule Take 1 capsule (100 mg total) by mouth 3 (three) times daily.  Marland Kitchen glucose blood (ACCU-CHEK GUIDE) test strip Check blood sugar up to 3 times a day  . hydrALAZINE (APRESOLINE) 50 MG tablet Take 1 tablet by mouth 4 times daily (Patient taking differently: Take 50 mg by mouth 4 (four) times daily. )  . insulin aspart (NOVOLOG) 100 UNIT/ML FlexPen Inject 25 Units into the skin 3 (three) times daily with meals. 20 unit morning, 25 units lunch 30 units for dinner (Patient taking differently: Inject 20-30 Units into the skin See admin instructions. 20- 25 units in the afternoon and 30 units before bedtime)  . Insulin Pen Needle (B-D UF III MINI PEN NEEDLES) 31G X 5 MM MISC USE  4 TIMES DAILY  . Insulin Pen Needle (PEN NEEDLES 3/16") 31G X 5 MM MISC Use five times daily  . Lancet Devices (ACCU-CHEK SOFTCLIX) lancets Use to check blood sugars 5 times a day. Dx code:E11.9. Insulin dependent.  . lovastatin (MEVACOR) 20 MG tablet Take 1 tablet (20 mg total) by mouth daily. (Patient taking differently: Take 20 mg by mouth at bedtime. )  . Melatonin 10 MG TABS Take 10 mg by mouth  at bedtime as needed (sleep).  . metolazone (ZAROXOLYN) 2.5 MG tablet Take 2.5 mg by mouth every other day.  . Omega-3 Fatty Acids (FISH OIL) 1000 MG CAPS Take 1,000 mg by mouth at bedtime.   . ondansetron (ZOFRAN ODT) 4 MG disintegrating tablet Take 1 tablet (4 mg total) by mouth every 8 (eight) hours as needed for nausea or vomiting.  Marland Kitchen oxybutynin (DITROPAN) 5 MG tablet Take 5 mg by mouth 3 (three) times daily.  . tamsulosin (FLOMAX) 0.4 MG CAPS capsule Take 0.4 mg by mouth at bedtime.   . TRESIBA FLEXTOUCH 200 UNIT/ML FlexTouch Pen INJECT 70 UNITS SUBCUTANEOUSLY ONCE DAILY (Patient taking differently: Inject 96 Units into the skin in the morning. )  . trimethoprim (TRIMPEX) 100 MG tablet Take 100 mg by mouth at bedtime.  Marland Kitchen VICTOZA 18 MG/3ML SOPN INJECT 1.2 MG (0.2 MLS) INTO THE SKIN ONCE DAILY AT THE SAME TIME.   No facility-administered encounter medications on file as of 08/20/2019.     Objective:  Wt Readings from Last 3 Encounters:  07/30/19 (!) 366 lb 6.5 oz (166.2 kg)  07/24/19 (!) 360 lb (163.3 kg)  07/02/19 (!) 360 lb (163.3 kg)   Lab Results  Component Value Date   CREATININE 5.30 (H) 08/14/2019   BUN 107 (H) 08/14/2019   NA 136 08/14/2019   K 3.2 (L) 08/14/2019   CL 97 (L) 08/14/2019   CO2 24 08/14/2019   Lab Results  Component Value Date   LABMICR See below: 04/19/2017   LABMICR 1,586.5 02/15/2017   MICROALBUR 160.61 (H) 08/14/2013   MICROALBUR 42.15 (H) 06/17/2012     Lab Results  Component Value Date   NA 136 08/14/2019   K 3.2 (L) 08/14/2019   CREATININE 5.30 (H) 08/14/2019   GFRNONAA 12 (L) 08/14/2019   GFRAA 14 (L) 08/14/2019   GLUCOSE 80 08/14/2019    Goals Addressed              This Visit's Progress     Patient Stated   .  "I want to improve my kidney health." (pt-stated)        CARE PLAN ENTRY (see longitudinal plan of care for additional care plan information)  Current Barriers:  . Chronic Disease Management support, education, and  care coordination needs related to CHF, HTN, HLD, DMII, and CKD Stage 3 - spoke with patient to complete initial assessment, he says he was very disappointed when his bariatric surgery was cancelled due to his elevated creatinine but was also motivated to improve his kidney health, he says he follows a low CHO, healthy protein diet, exercises 6 days a week at a gym (despite right AKA) for 60 minute sessions that include cardio and strengthening and working with a Physiological scientist, he says he has never been instructed to weigh by his cardiologist but does state that he "retains fluid" at times as evidenced by swelling in his left leg, he says he has recently lost weight but does not know how much because he doesn't weigh  very often but says his prosthesis needs resizing by [employer] due to the weight loss, he also says he needs a CPAP per his cardiologist Dr Radford Pax, says he uses a walker and/or Rolator to ambulate, denies falls  Clinical Goal(s) related to CHF, HTN, HLD, DMII, and CKD Stage 3 :  Over the next 30-60 days, patient will:  . Work with the care management team to address educational, disease management, and care coordination needs  . Begin or continue self health monitoring activities as directed today Measure and record CBG (blood glucose) 1-3 times daily, Measure and record blood pressure 4-5 times per week, and voice knowledge of strategies to improve kidney function . Call provider office for new or worsened signs and symptoms Blood glucose findings outside established parameters, Blood pressure findings outside established parameters, Chest pain, Shortness of breath, and New or worsened symptom related to IDDM, HTN , CKD  . Call care management team with questions or concerns . Verbalize basic understanding of patient centered plan of care established today  Interventions related to CHF, HTN, HLD, DMII, and CKD Stage 3 :  . Prior to interviewing patient, reviewed patient status, including  review of consultants reports, relevant laboratory and other test results, and medications, completed. . Evaluation of current treatment plans and patient's adherence to plan as established by provider . Assessed patient understanding of disease states . Assessed patient's education and care coordination needs . Provided disease specific education to patient - after discussion with patient, Emmi education modules on kidney disease emailed to patient  . Mailed [facility] [employer] spiral bound calendar with HTN and DM education and business cards for [employer] and CCM BSW  . Included Medicaid transportation information in the mailing in case patient wishes to enroll in the program at a later date . Collaborated with appropriate clinical care team members regarding patient needs  Patient Self Care Activities related to CHF, HTN, DMII, and CKD Stage 3 :  . Patient is unable to independently self-manage chronic health conditions  Initial goal documentation          Other   .  Blood Pressure < 140/90        BP Readings from Last 3 Encounters:  08/14/19 (!) 162/83  07/31/19 (!) 133/67  07/24/19 132/72   Not meeting blood pressure targets    .  HEMOGLOBIN A1C < 7        Lab Results  Component Value Date   HGBA1C 6.5 (H) 07/03/2019   Meeting Hgb A1C target    .  LDL CALC < 100        Lab Results  Component Value Date   CHOL 122 11/14/2018   HDL 34 (L) 11/14/2018   LDLCALC 64 11/14/2018   TRIG 135 11/14/2018   CHOLHDL 3.6 11/14/2018   Meeting lipid targets      Mr. Grobe was given information about Chronic Care Management services today including:  1. CCM service includes personalized support from designated clinical staff supervised by his physician, including individualized plan of care and coordination with other care providers 2. 24/7 contact phone numbers for assistance for urgent and routine care needs. 3. Service will only be billed when office clinical  staff spend 20 minutes or more in a month to coordinate care. 4. Only one practitioner may furnish and bill the service in a calendar month. 5. The patient may stop CCM services at any time (effective at the end of the month) by phone  call to the office staff. 6. The patient will be responsible for cost sharing (co-pay) of up to 20% of the service fee (after annual deductible is met).  Patient agreed to services and verbal consent obtained.   Plan:   The care management team will reach out to the patient again over the next 30-60 days.   Kelli Churn RN, CCM, [facility] RN Care Manager [phone]

## 2019-08-21 NOTE — Patient Instructions (Signed)
Visit Information It was nice speaking with you. I will e-mail you Emmi education on chronic kidney disease.  Goals Addressed              This Visit's Progress     Patient Stated     "I want to improve my kidney health." (pt-stated)        CARE PLAN ENTRY (see longitudinal plan of care for additional care plan information)  Current Barriers:   Chronic Disease Management support, education, and care coordination needs related to CHF, HTN, HLD, DMII, and CKD Stage 3 - spoke with patient to complete initial assessment, he says he was very disappointed when his bariatric surgery was cancelled due to his elevated creatinine but was also motivated to improve his kidney health, he says he follows a low CHO, healthy protein diet, exercises 6 days a week at a gym (despite right AKA) for 60 minute sessions that include cardio and strengthening and working with a Physiological scientist, he says he has never been instructed to weigh by his cardiologist but does state that he "retains fluid" at times as evidenced by swelling in his left leg  Clinical Goal(s) related to CHF, HTN, HLD, DMII, and CKD Stage 3 :  Over the next 30-60 days, patient will:   Work with the care management team to address educational, disease management, and care coordination needs   Begin or continue self health monitoring activities as directed today Measure and record CBG (blood glucose) 1-3 times daily, Measure and record blood pressure 4-5 times per week, and voice knowledge of strategies to improve kidney function  Call provider office for new or worsened signs and symptoms Blood glucose findings outside established parameters, Blood pressure findings outside established parameters, Chest pain, Shortness of breath, and New or worsened symptom related to IDDM, HTN , CKD   Call care management team with questions or concerns  Verbalize basic understanding of patient centered plan of care established today  Interventions  related to CHF, HTN, HLD, DMII, and CKD Stage 3 :   Evaluation of current treatment plans and patient's adherence to plan as established by provider  Assessed patient understanding of disease states  Assessed patient's education and care coordination needs  Provided disease specific education to patient - after discussion with patient, Emmi education modules on kidney disease emailed to patient   [place] Management spiral bound calendar with HTN and DM education and business cards for CCM RN and CCM BSW   Collaborated with appropriate clinical care team members regarding patient needs  Patient Self Care Activities related to CHF, HTN, DMII, and CKD Stage 3 :   Patient is unable to independently self-manage chronic health conditions  Initial goal documentation       Other     Blood Pressure < 140/90        BP Readings from Last 3 Encounters:  08/14/19 (!) 162/83  07/31/19 (!) 133/67  07/24/19 132/72   Not meeting blood pressure targets      HEMOGLOBIN A1C < 7        Lab Results  Component Value Date   HGBA1C 6.5 (H) 07/03/2019   Meeting Hgb A1C target      LDL CALC < 100        Lab Results  Component Value Date   CHOL 122 11/14/2018   HDL 34 (L) 11/14/2018   LDLCALC 64 11/14/2018   TRIG 135 11/14/2018   CHOLHDL 3.6 11/14/2018  Meeting lipid targets       Mr. Morales was given information about Chronic Care Management services today including:  1. CCM service includes personalized support from designated clinical staff supervised by his physician, including individualized plan of care and coordination with other care providers 2. 24/7 contact phone numbers for assistance for urgent and routine care needs. 3. Service will only be billed when office clinical staff spend 20 minutes or more in a month to coordinate care. 4. Only one practitioner may furnish and bill the service in a calendar month. 5. The patient may stop CCM services at  any time (effective at the end of the month) by phone call to the office staff. 6. The patient will be responsible for cost sharing (co-pay) of up to 20% of the service fee (after annual deductible is met).  Patient agreed to services and verbal consent obtained.   The patient verbalized understanding of instructions provided today and declined a print copy of patient instruction materials.   The care management team will reach out to the patient again over the next 30-60 days.   Kelli Churn RN, CCM, [facility] RN Care Manager [phone]

## 2019-08-26 ENCOUNTER — Ambulatory Visit: Payer: Medicare Other | Admitting: Dietician

## 2019-08-26 ENCOUNTER — Other Ambulatory Visit: Payer: Self-pay | Admitting: *Deleted

## 2019-08-26 DIAGNOSIS — I5032 Chronic diastolic (congestive) heart failure: Secondary | ICD-10-CM

## 2019-08-26 MED ORDER — FUROSEMIDE 80 MG PO TABS: 80.0000 mg | ORAL_TABLET | Freq: Two times a day (BID) | ORAL | 0 refills | Status: DC

## 2019-08-29 ENCOUNTER — Other Ambulatory Visit: Payer: Self-pay

## 2019-08-29 ENCOUNTER — Inpatient Hospital Stay (HOSPITAL_COMMUNITY)
Admission: EM | Admit: 2019-08-29 | Discharge: 2019-09-02 | DRG: 391 | Disposition: A | Discharge status: Home / Self Care | Payer: Medicare Other | Attending: Internal Medicine | Admitting: Internal Medicine

## 2019-08-29 ENCOUNTER — Inpatient Hospital Stay (HOSPITAL_COMMUNITY): Payer: Medicare Other

## 2019-08-29 ENCOUNTER — Emergency Department (HOSPITAL_COMMUNITY): Payer: Medicare Other

## 2019-08-29 DIAGNOSIS — Z87440 Personal history of urinary (tract) infections: Secondary | ICD-10-CM

## 2019-08-29 DIAGNOSIS — I132 Hypertensive heart and chronic kidney disease with heart failure and with stage 5 chronic kidney disease, or end stage renal disease: Secondary | ICD-10-CM | POA: Diagnosis present

## 2019-08-29 DIAGNOSIS — E860 Dehydration: Secondary | ICD-10-CM | POA: Diagnosis present

## 2019-08-29 DIAGNOSIS — I5032 Chronic diastolic (congestive) heart failure: Secondary | ICD-10-CM | POA: Diagnosis present

## 2019-08-29 DIAGNOSIS — K5900 Constipation, unspecified: Principal | ICD-10-CM | POA: Diagnosis present

## 2019-08-29 DIAGNOSIS — Z86718 Personal history of other venous thrombosis and embolism: Secondary | ICD-10-CM | POA: Diagnosis not present

## 2019-08-29 DIAGNOSIS — Z86711 Personal history of pulmonary embolism: Secondary | ICD-10-CM

## 2019-08-29 DIAGNOSIS — E876 Hypokalemia: Secondary | ICD-10-CM | POA: Diagnosis present

## 2019-08-29 DIAGNOSIS — G4733 Obstructive sleep apnea (adult) (pediatric): Secondary | ICD-10-CM | POA: Diagnosis present

## 2019-08-29 DIAGNOSIS — Z888 Allergy status to other drugs, medicaments and biological substances status: Secondary | ICD-10-CM

## 2019-08-29 DIAGNOSIS — K8020 Calculus of gallbladder without cholecystitis without obstruction: Secondary | ICD-10-CM | POA: Diagnosis present

## 2019-08-29 DIAGNOSIS — Z86010 Personal history of colonic polyps: Secondary | ICD-10-CM

## 2019-08-29 DIAGNOSIS — R1084 Generalized abdominal pain: Secondary | ICD-10-CM | POA: Diagnosis not present

## 2019-08-29 DIAGNOSIS — Z89611 Acquired absence of right leg above knee: Secondary | ICD-10-CM

## 2019-08-29 DIAGNOSIS — N185 Chronic kidney disease, stage 5: Secondary | ICD-10-CM | POA: Diagnosis not present

## 2019-08-29 DIAGNOSIS — E119 Type 2 diabetes mellitus without complications: Secondary | ICD-10-CM

## 2019-08-29 DIAGNOSIS — N529 Male erectile dysfunction, unspecified: Secondary | ICD-10-CM | POA: Diagnosis present

## 2019-08-29 DIAGNOSIS — E11649 Type 2 diabetes mellitus with hypoglycemia without coma: Secondary | ICD-10-CM | POA: Diagnosis present

## 2019-08-29 DIAGNOSIS — R109 Unspecified abdominal pain: Secondary | ICD-10-CM | POA: Diagnosis present

## 2019-08-29 DIAGNOSIS — N179 Acute kidney failure, unspecified: Secondary | ICD-10-CM | POA: Diagnosis present

## 2019-08-29 DIAGNOSIS — N3000 Acute cystitis without hematuria: Secondary | ICD-10-CM | POA: Diagnosis present

## 2019-08-29 DIAGNOSIS — Z833 Family history of diabetes mellitus: Secondary | ICD-10-CM

## 2019-08-29 DIAGNOSIS — S99912A Unspecified injury of left ankle, initial encounter: Secondary | ICD-10-CM | POA: Diagnosis not present

## 2019-08-29 DIAGNOSIS — Z20822 Contact with and (suspected) exposure to covid-19: Secondary | ICD-10-CM | POA: Diagnosis present

## 2019-08-29 DIAGNOSIS — I5033 Acute on chronic diastolic (congestive) heart failure: Secondary | ICD-10-CM | POA: Diagnosis present

## 2019-08-29 DIAGNOSIS — Z6841 Body Mass Index (BMI) 40.0 and over, adult: Secondary | ICD-10-CM | POA: Diagnosis not present

## 2019-08-29 DIAGNOSIS — Z7901 Long term (current) use of anticoagulants: Secondary | ICD-10-CM | POA: Diagnosis not present

## 2019-08-29 DIAGNOSIS — M25572 Pain in left ankle and joints of left foot: Secondary | ICD-10-CM | POA: Diagnosis present

## 2019-08-29 DIAGNOSIS — I10 Essential (primary) hypertension: Secondary | ICD-10-CM | POA: Diagnosis present

## 2019-08-29 DIAGNOSIS — F419 Anxiety disorder, unspecified: Secondary | ICD-10-CM | POA: Diagnosis present

## 2019-08-29 DIAGNOSIS — Z79899 Other long term (current) drug therapy: Secondary | ICD-10-CM

## 2019-08-29 DIAGNOSIS — E1140 Type 2 diabetes mellitus with diabetic neuropathy, unspecified: Secondary | ICD-10-CM | POA: Diagnosis present

## 2019-08-29 DIAGNOSIS — N186 End stage renal disease: Secondary | ICD-10-CM | POA: Diagnosis present

## 2019-08-29 DIAGNOSIS — M7989 Other specified soft tissue disorders: Secondary | ICD-10-CM | POA: Diagnosis not present

## 2019-08-29 DIAGNOSIS — M19072 Primary osteoarthritis, left ankle and foot: Secondary | ICD-10-CM | POA: Diagnosis not present

## 2019-08-29 DIAGNOSIS — F329 Major depressive disorder, single episode, unspecified: Secondary | ICD-10-CM | POA: Diagnosis present

## 2019-08-29 DIAGNOSIS — D631 Anemia in chronic kidney disease: Secondary | ICD-10-CM | POA: Diagnosis present

## 2019-08-29 DIAGNOSIS — E1122 Type 2 diabetes mellitus with diabetic chronic kidney disease: Secondary | ICD-10-CM | POA: Diagnosis present

## 2019-08-29 DIAGNOSIS — Z8249 Family history of ischemic heart disease and other diseases of the circulatory system: Secondary | ICD-10-CM

## 2019-08-29 DIAGNOSIS — E785 Hyperlipidemia, unspecified: Secondary | ICD-10-CM | POA: Diagnosis present

## 2019-08-29 DIAGNOSIS — Z992 Dependence on renal dialysis: Secondary | ICD-10-CM

## 2019-08-29 DIAGNOSIS — Z7984 Long term (current) use of oral hypoglycemic drugs: Secondary | ICD-10-CM | POA: Diagnosis not present

## 2019-08-29 DIAGNOSIS — Z794 Long term (current) use of insulin: Secondary | ICD-10-CM

## 2019-08-29 LAB — CBC
HCT: 32.7 % — ABNORMAL LOW (ref 39.0–52.0)
Hemoglobin: 10.3 g/dL — ABNORMAL LOW (ref 13.0–17.0)
MCH: 27.8 pg (ref 26.0–34.0)
MCHC: 31.5 g/dL (ref 30.0–36.0)
MCV: 88.4 fL (ref 80.0–100.0)
Platelets: 382 10*3/uL (ref 150–400)
RBC: 3.7 MIL/uL — ABNORMAL LOW (ref 4.22–5.81)
RDW: 14.7 % (ref 11.5–15.5)
WBC: 13.2 10*3/uL — ABNORMAL HIGH (ref 4.0–10.5)
nRBC: 0 % (ref 0.0–0.2)

## 2019-08-29 LAB — COMPREHENSIVE METABOLIC PANEL
ALT: 36 U/L (ref 0–44)
AST: 49 U/L — ABNORMAL HIGH (ref 15–41)
Albumin: 3.6 g/dL (ref 3.5–5.0)
Alkaline Phosphatase: 81 U/L (ref 38–126)
Anion gap: 11 (ref 5–15)
BUN: 127 mg/dL — ABNORMAL HIGH (ref 6–20)
CO2: 22 mmol/L (ref 22–32)
Calcium: 9.1 mg/dL (ref 8.9–10.3)
Chloride: 98 mmol/L (ref 98–111)
Creatinine, Ser: 5.47 mg/dL — ABNORMAL HIGH (ref 0.61–1.24)
GFR calc Af Amer: 14 mL/min — ABNORMAL LOW (ref >60)
GFR calc non Af Amer: 12 mL/min — ABNORMAL LOW (ref >60)
Glucose, Bld: 58 mg/dL — ABNORMAL LOW (ref 70–99)
Potassium: 2.8 mmol/L — ABNORMAL LOW (ref 3.5–5.1)
Sodium: 131 mmol/L — ABNORMAL LOW (ref 135–145)
Total Bilirubin: 0.8 mg/dL (ref 0.3–1.2)
Total Protein: 7.9 g/dL (ref 6.5–8.1)

## 2019-08-29 LAB — URINALYSIS, ROUTINE W REFLEX MICROSCOPIC
Bilirubin Urine: NEGATIVE
Glucose, UA: NEGATIVE mg/dL
Ketones, ur: NEGATIVE mg/dL
Nitrite: NEGATIVE
Protein, ur: 100 mg/dL — AB
Specific Gravity, Urine: 1.01 (ref 1.005–1.030)
WBC, UA: >50 WBC/hpf — ABNORMAL HIGH (ref 0–5)
pH: 5 (ref 5.0–8.0)

## 2019-08-29 LAB — GLUCOSE, CAPILLARY: Glucose-Capillary: 77 mg/dL (ref 70–99)

## 2019-08-29 LAB — CBG MONITORING, ED: Glucose-Capillary: 86 mg/dL (ref 70–99)

## 2019-08-29 LAB — SARS CORONAVIRUS 2 BY RT PCR (HOSPITAL ORDER, PERFORMED IN ~~LOC~~ HOSPITAL LAB): SARS Coronavirus 2: NEGATIVE

## 2019-08-29 LAB — LIPASE, BLOOD: Lipase: 50 U/L (ref 11–51)

## 2019-08-29 MED ORDER — APIXABAN 5 MG PO TABS
5.0000 mg | ORAL_TABLET | Freq: Two times a day (BID) | ORAL | Status: DC
  Administered 2019-08-30 – 2019-09-02 (×8): 5 mg via ORAL
  Filled 2019-08-29 (×8): qty 1

## 2019-08-29 MED ORDER — LACTATED RINGERS IV SOLN: INTRAVENOUS | Status: AC

## 2019-08-29 MED ORDER — ONDANSETRON HCL 4 MG/2ML IJ SOLN
4.0000 mg | Freq: Once | INTRAMUSCULAR | Status: AC
  Administered 2019-08-29: 4 mg via INTRAVENOUS
  Filled 2019-08-29: qty 2

## 2019-08-29 MED ORDER — POLYETHYLENE GLYCOL 3350 17 G PO PACK
17.0000 g | PACK | Freq: Two times a day (BID) | ORAL | Status: DC
  Administered 2019-09-01: 17 g via ORAL
  Filled 2019-08-29 (×6): qty 1

## 2019-08-29 MED ORDER — RISAQUAD PO CAPS
1.0000 | ORAL_CAPSULE | Freq: Every day | ORAL | Status: DC
  Administered 2019-08-30 – 2019-09-02 (×4): 1 via ORAL
  Filled 2019-08-29 (×4): qty 1

## 2019-08-29 MED ORDER — MORPHINE SULFATE (PF) 4 MG/ML IV SOLN
4.0000 mg | Freq: Once | INTRAVENOUS | Status: AC
  Administered 2019-08-29: 4 mg via INTRAVENOUS
  Filled 2019-08-29: qty 1

## 2019-08-29 MED ORDER — GABAPENTIN 100 MG PO CAPS
100.0000 mg | ORAL_CAPSULE | Freq: Three times a day (TID) | ORAL | Status: DC
  Administered 2019-08-30 – 2019-09-02 (×11): 100 mg via ORAL
  Filled 2019-08-29 (×11): qty 1

## 2019-08-29 MED ORDER — ONDANSETRON HCL 4 MG PO TABS
4.0000 mg | ORAL_TABLET | Freq: Four times a day (QID) | ORAL | Status: DC | PRN
  Administered 2019-08-30: 4 mg via ORAL
  Filled 2019-08-29: qty 1

## 2019-08-29 MED ORDER — HYDRALAZINE HCL 50 MG PO TABS
50.0000 mg | ORAL_TABLET | Freq: Four times a day (QID) | ORAL | Status: DC
  Administered 2019-08-30 – 2019-09-02 (×14): 50 mg via ORAL
  Filled 2019-08-29 (×14): qty 1

## 2019-08-29 MED ORDER — LACTATED RINGERS IV SOLN: INTRAVENOUS | Status: DC

## 2019-08-29 MED ORDER — SODIUM CHLORIDE 0.9 % IV SOLN
1.0000 g | Freq: Once | INTRAVENOUS | Status: AC
  Administered 2019-08-30: 1 g via INTRAVENOUS
  Filled 2019-08-29: qty 10

## 2019-08-29 MED ORDER — OXYBUTYNIN CHLORIDE 5 MG PO TABS
5.0000 mg | ORAL_TABLET | Freq: Three times a day (TID) | ORAL | Status: DC
  Administered 2019-08-30 – 2019-09-01 (×8): 5 mg via ORAL
  Filled 2019-08-29 (×8): qty 1

## 2019-08-29 MED ORDER — TRIMETHOPRIM 100 MG PO TABS
100.0000 mg | ORAL_TABLET | Freq: Every day | ORAL | Status: DC
  Administered 2019-08-30: 100 mg via ORAL
  Filled 2019-08-29 (×2): qty 1

## 2019-08-29 MED ORDER — SENNOSIDES-DOCUSATE SODIUM 8.6-50 MG PO TABS
2.0000 | ORAL_TABLET | Freq: Two times a day (BID) | ORAL | Status: DC
  Administered 2019-08-30 – 2019-09-01 (×4): 2 via ORAL
  Filled 2019-08-29 (×8): qty 2

## 2019-08-29 MED ORDER — AMLODIPINE BESYLATE 10 MG PO TABS
10.0000 mg | ORAL_TABLET | Freq: Every day | ORAL | Status: DC
  Administered 2019-08-30 – 2019-09-01 (×4): 10 mg via ORAL
  Filled 2019-08-29 (×4): qty 1

## 2019-08-29 MED ORDER — MAGNESIUM SULFATE 2 GM/50ML IV SOLN
2.0000 g | Freq: Once | INTRAVENOUS | Status: AC
  Administered 2019-08-29: 2 g via INTRAVENOUS
  Filled 2019-08-29: qty 50

## 2019-08-29 MED ORDER — ACETAMINOPHEN 325 MG PO TABS
650.0000 mg | ORAL_TABLET | Freq: Four times a day (QID) | ORAL | Status: DC | PRN
  Administered 2019-08-31 (×2): 650 mg via ORAL
  Filled 2019-08-29 (×2): qty 2

## 2019-08-29 MED ORDER — ONDANSETRON HCL 4 MG/2ML IJ SOLN
4.0000 mg | Freq: Four times a day (QID) | INTRAMUSCULAR | Status: DC | PRN
  Administered 2019-09-02: 4 mg via INTRAVENOUS
  Filled 2019-08-29 (×2): qty 2

## 2019-08-29 MED ORDER — POTASSIUM CHLORIDE 10 MEQ/100ML IV SOLN
10.0000 meq | INTRAVENOUS | Status: AC
  Administered 2019-08-29 (×4): 10 meq via INTRAVENOUS
  Filled 2019-08-29 (×4): qty 100

## 2019-08-29 MED ORDER — TAMSULOSIN HCL 0.4 MG PO CAPS
0.4000 mg | ORAL_CAPSULE | Freq: Every day | ORAL | Status: DC
  Administered 2019-08-30 – 2019-09-01 (×4): 0.4 mg via ORAL
  Filled 2019-08-29 (×4): qty 1

## 2019-08-29 MED ORDER — FUROSEMIDE 80 MG PO TABS: 80.0000 mg | ORAL_TABLET | Freq: Two times a day (BID) | ORAL | Status: DC

## 2019-08-29 MED ORDER — CARVEDILOL 25 MG PO TABS
25.0000 mg | ORAL_TABLET | Freq: Two times a day (BID) | ORAL | Status: DC
  Administered 2019-08-30 – 2019-09-02 (×7): 25 mg via ORAL
  Filled 2019-08-29 (×7): qty 1

## 2019-08-29 MED ORDER — CALCITRIOL 0.5 MCG PO CAPS
0.5000 ug | ORAL_CAPSULE | Freq: Every day | ORAL | Status: DC
  Administered 2019-08-30 – 2019-09-02 (×4): 0.5 ug via ORAL
  Filled 2019-08-29 (×4): qty 1

## 2019-08-29 MED ORDER — CLONIDINE HCL 0.3 MG PO TABS
0.3000 mg | ORAL_TABLET | Freq: Two times a day (BID) | ORAL | Status: DC
  Administered 2019-08-30 – 2019-09-02 (×8): 0.3 mg via ORAL
  Filled 2019-08-29 (×8): qty 1

## 2019-08-29 MED ORDER — ACETAMINOPHEN 650 MG RE SUPP: 650.0000 mg | Freq: Four times a day (QID) | RECTAL | Status: DC | PRN

## 2019-08-29 MED ORDER — ALLOPURINOL 100 MG PO TABS
200.0000 mg | ORAL_TABLET | Freq: Every day | ORAL | Status: DC
  Administered 2019-08-30 – 2019-09-02 (×4): 200 mg via ORAL
  Filled 2019-08-29 (×4): qty 2

## 2019-08-29 MED ORDER — OXYCODONE HCL 5 MG PO TABS
5.0000 mg | ORAL_TABLET | Freq: Once | ORAL | Status: AC
  Administered 2019-08-29: 5 mg via ORAL
  Filled 2019-08-29: qty 1

## 2019-08-29 MED ORDER — METOLAZONE 2.5 MG PO TABS
2.5000 mg | ORAL_TABLET | ORAL | Status: DC
  Administered 2019-08-30 – 2019-09-01 (×2): 2.5 mg via ORAL
  Filled 2019-08-29 (×2): qty 1

## 2019-08-29 NOTE — ED Provider Notes (Signed)
[facility] EMERGENCY DEPARTMENT Provider Note   CSN: [id number] Arrival date & time: 08/29/19  3235     History Chief Complaint  Patient presents with   Abdominal Pain    [name] is a 44 y.o. male.  The history is provided by the patient.  Abdominal Pain Pain location:  Generalized Pain quality: aching and cramping   Pain quality: not dull   Pain radiates to:  Does not radiate Pain severity:  Severe Onset quality:  Gradual Duration:  2 days Timing:  Constant Progression:  Worsening Chronicity:  Recurrent Relieved by:  Nothing Worsened by:  Nothing Ineffective treatments:  None tried Associated symptoms: nausea and vomiting   Associated symptoms: no chest pain, no chills, no diarrhea, no dysuria, no hematemesis, no hematochezia, no hematuria and no shortness of breath   Risk factors: obesity        Past Medical History:  Diagnosis Date   Acute venous embolism and thrombosis of deep vessels of proximal lower extremity (HCC) 07/19/2011   Anemia    Anginal pain (HCC)    pt denies   Anxiety    Chest pain, neg MI, normal coronaries by cath 02/18/2013   pt denies   CHF (congestive heart failure) (HCC)    CKD (chronic kidney disease) stage 3, GFR 30-59 ml/min 02/19/2013   Colon polyps    Depression    Diabetic ulcer of right foot ([place])    DVT (deep venous thrombosis) ([place]) 09/2002   patient reports additional DVTs in '06 & '11 (unconfirmed)   ED (erectile dysfunction)    GERD (gastroesophageal reflux disease)    History of blood transfusion    "related to OR" (10/31/2016)   Hyperlipidemia 02/19/2013   Hypertension    Nausea & vomiting    "constant for the last couple weeks" (10/31/2016)   Nephrotic syndrome    Obesity    BMI 44, weight 346 pounds 01/30/14   OSA (obstructive sleep apnea) 02/28/2018   Mild obstructive sleep apnea with an AHI of 9.8/h but severe during REM sleep with an AHI of 33.8/h.  Oxygen saturations  dropped to 86% and there was moderate snoring   Peripheral edema    Pneumonia    Prosthesis adjustment 08/17/2016   Pulmonary embolism ([place]) 09/2002   treated with 6 months of warfarin   Pyelonephritis 02/02/2018   Type I diabetes mellitus ([place]) dx'd 2001   Urinary retention     Patient Active Problem List   Diagnosis Date Noted   Abdominal pain 08/29/2019   Anemia associated with chronic renal failure    CKD (chronic kidney disease)    Peripheral edema    Hypervolemia associated with renal insufficiency 07/30/2019   Acute renal failure with acute tubular necrosis superimposed on stage 4 chronic kidney disease (HCC)    Multifocal pneumonia    Acute pain of left knee 01/01/2019   Hemoglobinuria 12/12/2018   Hematoma of left lower leg 12/12/2018   Urinary tract infection 09/04/2018   Exposure to SARS-associated coronavirus 07/25/2018   Headache 06/18/2018   Leg swelling    Cellulitis 04/12/2018   Pain of left lower extremity 03/20/2018   OSA (obstructive sleep apnea) 02/28/2018   Chronic diastolic heart failure ([place]) 02/02/2018   Healthcare maintenance 08/15/2017   Erectile dysfunction 04/22/2017   History of pulmonary embolus (PE) 06/09/2016   Anxiety 05/04/2016   Depression 05/04/2016   Hypogonadism in male 05/04/2016   S/P AKA (above knee amputation) unilateral, right ([place]) 05/04/2016  Urinary retention 05/04/2016   Anemia of chronic disease 05/04/2016   Morbid obesity with BMI of 40.0-44.9, adult ([place]) 10/14/2015   Dyslipidemia 08/12/2014   Pedal edema 08/12/2014   Uncontrolled type 2 diabetes mellitus with hyperglycemia, with long-term current use of insulin ([place]) 08/12/2014   Unilateral AKA, right ([place]) 12/26/2013   Hypoglycemic event in diabetes ([place]) 12/17/2013   Hyperlipidemia 02/19/2013   S/P cardiac cath, 02/18/13, normal coronaries 02/19/2013   Chronic renal insufficiency, stage 3 (moderate) 02/19/2013   Hypertension  07/24/2011   Long term current use of anticoagulant therapy 07/19/2011   History of DVT (deep vein thrombosis) 07/28/2009   Morbid obesity ([place]) 10/26/2005   Insulin dependent type 2 diabetes mellitus, controlled ([place]) 01/09/2002    Past Surgical History:  Procedure Laterality Date   AMPUTATION Right 12/22/2013   Procedure: AMPUTATION BELOW KNEE;  Surgeon: [name], MD;  Location: [place];  Service: Orthopedics;  Laterality: Right;   AMPUTATION Right 02/19/2014   Procedure: RIGHT ABOVE KNEE AMPUTATION ;  Surgeon: [name], MD;  Location: [place];  Service: Orthopedics;  Laterality: Right;   blood clot removal  2016   CARDIAC CATHETERIZATION  02/18/2013   normal coronaries   ESOPHAGOGASTRODUODENOSCOPY N/A 11/02/2016   Procedure: ESOPHAGOGASTRODUODENOSCOPY (EGD);  Surgeon: [name], MD;  Location: [facility] ENDOSCOPY;  Service: Endoscopy;  Laterality: N/A;   I & D EXTREMITY Right 12/14/2013   Procedure: IRRIGATION AND DEBRIDEMENT RIGHT FOOT;  Surgeon: [name], MD;  Location: [place];  Service: Orthopedics;  Laterality: Right;   INCISION AND DRAINAGE ABSCESS  2007; 2015   "back"   INCISION AND DRAINAGE OF WOUND Right 12/22/2013   Procedure: I&D RIGHT BUTTOCK;  Surgeon: [name], MD;  Location: [place];  Service: Orthopedics;  Laterality: Right;   LEFT HEART CATHETERIZATION WITH CORONARY ANGIOGRAM N/A 02/18/2013   Procedure: LEFT HEART CATHETERIZATION WITH CORONARY ANGIOGRAM;  Surgeon: [name], MD;  Location: [facility] CATH LAB;  Service: Cardiovascular;  Laterality: N/A;       Family History  Problem Relation Age of Onset   Heart disease Mother    Diabetes Mother    Diabetes Maternal Uncle    Diabetes Cousin     Social History   Tobacco Use   Smoking status: Never Smoker   Smokeless tobacco: Never Used  Vaping Use   Vaping Use: Never used  Substance Use Topics   Alcohol use: Yes    Comment: 10/31/2016 "a few beers q 6 months or so"   Drug use:  No    Home Medications Prior to Admission medications   Medication Sig Start Date End Date Taking? Authorizing Provider  Accu-Chek Softclix Lancets lancets Check blood sugar up to 3 times a day 05/08/19   Velna Ochs, MD  acidophilus (RISAQUAD) CAPS capsule Take 1 capsule by mouth daily.    [provider]  allopurinol (ZYLOPRIM) 100 MG tablet Take 2 tablets (200 mg total) by mouth daily. 09/05/18   Aldine Contes, MD  Alpha-Lipoic Acid 600 MG CAPS Take 600 mg by mouth daily.    [provider]  amLODipine (NORVASC) 10 MG tablet Take 10 mg by mouth at bedtime.    [provider]  Blood Glucose Monitoring Suppl (ACCU-CHEK GUIDE ME) w/Device KIT Use to check blood sugar up to 3 times a day 05/06/19   [name], MD  calcitRIOL (ROCALTROL) 0.5 MCG capsule Take 0.5 mcg by mouth daily. 05/06/19   [provider]  carvedilol (COREG) 25  MG tablet Take 1 tablet (25 mg total) by mouth 2 (two) times daily with a meal. 06/03/19 09/01/19  Chundi, Verne Spurr, MD  cloNIDine (CATAPRES) 0.3 MG tablet Take 1 tablet (0.3 mg total) by mouth 2 (two) times daily. 06/26/19   [name], MD  Cyanocobalamin (B-12) 500 MCG SUBL Place 500 mcg under the tongue daily.     [provider]  ELIQUIS 5 MG TABS tablet Take 1 tablet by mouth twice daily 08/22/19   Virl Axe, MD  famotidine (PEPCID) 20 MG tablet Take 1 tablet (20 mg total) by mouth 2 (two) times daily. Patient taking differently: Take 20 mg by mouth 2 (two) times daily as needed for heartburn or indigestion.  05/07/18   Hedges, Dellis Filbert, PA-C  furosemide (LASIX) 80 MG tablet Take 1 tablet (80 mg total) by mouth 2 (two) times daily. 08/26/19 11/24/19  Asencion Noble, MD  gabapentin (NEURONTIN) 100 MG capsule Take 1 capsule (100 mg total) by mouth 3 (three) times daily. 06/26/19   [name], MD  glucose blood (ACCU-CHEK GUIDE) test strip Check blood sugar up to 3 times a day 05/08/19   Velna Ochs,  MD  hydrALAZINE (APRESOLINE) 50 MG tablet Take 1 tablet by mouth 4 times daily Patient taking differently: Take 50 mg by mouth 4 (four) times daily.  07/15/19   Marianna Payment, MD  insulin aspart (NOVOLOG) 100 UNIT/ML FlexPen Inject 25 Units into the skin 3 (three) times daily with meals. 20 unit morning, 25 units lunch 30 units for dinner Patient taking differently: Inject 20-30 Units into the skin See admin instructions. 20- 25 units in the afternoon and 30 units before bedtime 10/04/18   Mosetta Anis, MD  Insulin Pen Needle (B-D UF III MINI PEN NEEDLES) 31G X 5 MM MISC USE  4 TIMES DAILY 09/24/18   [name], MD  Insulin Pen Needle (PEN NEEDLES 3/16") 31G X 5 MM MISC Use five times daily 08/14/16   [name], DO  Lancet Devices [facility]) lancets Use to check blood sugars 5 times a day. Dx code:E11.9. Insulin dependent. 08/14/16   [name], DO  lovastatin (MEVACOR) 20 MG tablet Take 1 tablet by mouth once daily 08/22/19   Virl Axe, MD  Melatonin 10 MG TABS Take 10 mg by mouth at bedtime as needed (sleep).    [provider]  metolazone (ZAROXOLYN) 2.5 MG tablet Take 2.5 mg by mouth every other day. 03/30/18   [provider]  Omega-3 Fatty Acids (FISH OIL) 1000 MG CAPS Take 1,000 mg by mouth at bedtime.     [provider]  ondansetron (ZOFRAN ODT) 4 MG disintegrating tablet Take 1 tablet (4 mg total) by mouth every 8 (eight) hours as needed for nausea or vomiting. 04/23/18   Annia Belt, MD  oxybutynin (DITROPAN) 5 MG tablet Take 5 mg by mouth 3 (three) times daily.    [provider]  tamsulosin (FLOMAX) 0.4 MG CAPS capsule Take 0.4 mg by mouth at bedtime.     [provider]  TRESIBA FLEXTOUCH 200 UNIT/ML FlexTouch Pen INJECT 70 UNITS SUBCUTANEOUSLY ONCE DAILY Patient taking differently: Inject 96 Units into the skin in the morning.  04/16/19   Jean Rosenthal, MD  trimethoprim (TRIMPEX) 100 MG tablet  Take 100 mg by mouth at bedtime. 12/23/18   [provider]  VICTOZA 18 MG/3ML SOPN INJECT 1.2 MG (0.2 MLS) INTO THE SKIN ONCE DAILY AT THE SAME TIME. 07/25/19  [name], MD    Allergies    Reglan [metoclopramide], Metformin and related, and Ozempic (0.25 or 0.5 mg-dose) [semaglutide(0.25 or 0.102m-dos)]  Review of Systems   Review of Systems  Constitutional: Negative for chills.  Respiratory: Negative for shortness of breath.   Cardiovascular: Negative for chest pain.  Gastrointestinal: Positive for abdominal pain, nausea and vomiting. Negative for diarrhea, hematemesis and hematochezia.  Genitourinary: Negative for dysuria and hematuria.    Physical Exam Updated Vital Signs BP (!) 165/84 (BP Location: Right Arm)    Pulse 96    Temp 99 F (37.2 C) (Oral)    Resp 15    SpO2 94%   Physical Exam Vitals and nursing note reviewed.  Constitutional:      Appearance: He is well-developed. He is obese.     Comments: Appears uncomfortable  HENT:     Head: Normocephalic and atraumatic.  Eyes:     Conjunctiva/sclera: Conjunctivae normal.  Cardiovascular:     Rate and Rhythm: Normal rate and regular rhythm.     Heart sounds: No murmur heard.   Pulmonary:     Effort: Pulmonary effort is normal. No respiratory distress.     Breath sounds: Normal breath sounds.  Abdominal:     Palpations: Abdomen is soft.     Tenderness: There is generalized abdominal tenderness. There is no right CVA tenderness, left CVA tenderness, guarding or rebound. Negative signs include Murphy's sign and McBurney's sign.  Musculoskeletal:     Cervical back: Neck supple.  Skin:    General: Skin is warm and dry.     Capillary Refill: Capillary refill takes less than 2 seconds.     Comments: Tender discoloration to L medial ankle w/o signs of purulence or erythema.   Neurological:     General: No focal deficit present.     Mental Status: He is alert and oriented to person, place, and time.     Cranial  Nerves: No cranial nerve deficit.     Motor: No weakness.  Psychiatric:        Mood and Affect: Mood normal.        Behavior: Behavior normal.     ED Results / Procedures / Treatments   Labs (all labs ordered are listed, but only abnormal results are displayed) Labs Reviewed  COMPREHENSIVE METABOLIC PANEL - Abnormal; Notable for the following components:      Result Value   Sodium 131 (*)    Potassium 2.8 (*)    Glucose, Bld 58 (*)    BUN 127 (*)    Creatinine, Ser 5.47 (*)    AST 49 (*)    GFR calc non Af Amer 12 (*)    GFR calc Af Amer 14 (*)    All other components within normal limits  CBC - Abnormal; Notable for the following components:   WBC 13.2 (*)    RBC 3.70 (*)    Hemoglobin 10.3 (*)    HCT 32.7 (*)    All other components within normal limits  URINALYSIS, ROUTINE W REFLEX MICROSCOPIC - Abnormal; Notable for the following components:   APPearance HAZY (*)    Hgb urine dipstick SMALL (*)    Protein, ur 100 (*)    Leukocytes,Ua LARGE (*)    WBC, UA >50 (*)    Bacteria, UA MANY (*)    All other components within normal limits  URINE CULTURE  SARS CORONAVIRUS 2 BY RT PCR (HOSPITAL ORDER, [facility])  LIPASE, BLOOD  CBG MONITORING, ED    EKG None  Radiology CT ABDOMEN PELVIS WO CONTRAST  Result Date: 08/29/2019 CLINICAL DATA:  Lower abdominal pain, nausea and vomiting EXAM: CT ABDOMEN AND PELVIS WITHOUT CONTRAST TECHNIQUE: Multidetector CT imaging of the abdomen and pelvis was performed following the standard protocol without IV contrast. COMPARISON:  09/18/2018, 02/08/2018 FINDINGS: Lower chest: No acute pleural or parenchymal lung disease. Hepatobiliary: Calcified gallstones are identified without cholecystitis. The liver is unremarkable. No biliary dilation. Pancreas: Unremarkable. No pancreatic ductal dilatation or surrounding inflammatory changes. Spleen: Normal in size without focal abnormality. Adrenals/Urinary Tract: Adrenal  glands are unremarkable. Kidneys are normal, without renal calculi, focal lesion, or hydronephrosis. Bladder is unremarkable. Stomach/Bowel: No bowel obstruction or ileus. Moderate stool throughout the colon. Normal appendix right lower quadrant. No bowel wall thickening or inflammatory change. Vascular/Lymphatic: No significant vascular findings are present. No enlarged abdominal or pelvic lymph nodes. Reproductive: Prostate is unremarkable. Other: No free fluid or free gas. No abdominal wall hernia. Musculoskeletal: No acute or destructive bony lesions. Reconstructed images demonstrate no additional findings. IMPRESSION: 1. Cholelithiasis without cholecystitis. 2. Moderate fecal retention. Electronically Signed   By: Randa Ngo M.D.   On: 08/29/2019 18:32    Procedures Procedures (including critical care time)  Medications Ordered in ED Medications  potassium chloride 10 mEq in 100 mL IVPB (10 mEq Intravenous New Bag/Given 08/29/19 1911)  lactated ringers infusion ( Intravenous New Bag/Given 08/29/19 1639)  cefTRIAXone (ROCEPHIN) 1 g in sodium chloride 0.9 % 100 mL IVPB (has no administration in time range)  ondansetron (ZOFRAN) injection 4 mg (4 mg Intravenous Given 08/29/19 1636)  magnesium sulfate IVPB 2 g 50 mL (0 g Intravenous Stopped 08/29/19 1752)  morphine 4 MG/ML injection 4 mg (4 mg Intravenous Given 08/29/19 1645)  morphine 4 MG/ML injection 4 mg (4 mg Intravenous Given 08/29/19 1916)    ED Course  I have reviewed the triage vital signs and the nursing notes.  Pertinent labs & imaging results that were available during my care of the patient were reviewed by me and considered in my medical decision making (see chart for details).    MDM Rules/Calculators/A&P                          44 year old male with complex past medical history including obesity, diastolic heart failure, type 2 diabetes, stage IV kidney disease who presents with abdominal pain.  Patient states that he has  been having worsening generalized abdominal pain for the past 2 to 3 days.  States that he is unable to tolerate p.o. intake and that he vomits up shortly after eating or drinking.  Denies any blood or bile in his vomit.  Denies any diarrhea however states that he has not had a bowel movement in approximately 2 to 3 days however he states that this is normal for him.  States that he has had one episode of pancreatitis secondary to Ozempic usage approximately 2 years ago and states that this feels very similar to that previous episode though he is no longer on Ozempic.  He is also endorsing pain to the inner left ankle and states that there is a bruise there.  Denies fever, chills, chest pain, shortness of breath, dysuria, hematuria, decreased urine output.  Afebrile and stable.  Exam as above.  Exam is notable for generalized abdominal tenderness without rebound or guarding.  Negative McBurney's or Murphy sign.  No CVA tenderness.  Labs obtained in triage notable for potassium 2.8, elevated creatinine 5.5 which appears from patient's baseline.  White count 13.2.  Lipase 50.  Glucose on BMP 58 however repeat 86.  Unclear etiology of the patient's abdominal pain however does not appear the pancreatitis given normal lipase.  Will obtain urine studies as well as a CT noncon given his poor GFR and reevaluate the patient.  We will also be giving fluids as well as potassium replacement in the meantime.  CT abdomen and pelvis notable for cholelithiasis w/o evidence of cholecystitis.  UA notable for many bacteria, large leukocytes.  Upon chart review pt has h/o chronic UTIs however now pt has large amount of bacteria in UA and has abdominal pain.  Pt also has a h/o cholelithiasis from previous hospitalizations seen on previous CT and U/S.  Unclear etiology to pt's abdominal pain although may be multifactorial 2/2 UTI, cholelithiasis w/ possible gastroparesis.  Pt given morphine and zofran w/ minimal improvement of his  sx.  Pt was started on Rocephin for UTI and will need admission due to intractable n/v and abdominal pain.  Discussed the case w/ the hospitalist who evaluated the pt and agreed w/ admission.  Pt admitted to the hospitalist service in stable condition w/o further events.  Pt had no further events in the ED during the duration of my shift.  Final Clinical Impression(s) / ED Diagnoses Final diagnoses:  Generalized abdominal pain  Acute cystitis without hematuria    Rx / DC Orders ED Discharge Orders    None       Silvestre Gunner, MD 08/29/19 2012    Blanchie Dessert, MD 08/30/19 2044

## 2019-08-29 NOTE — Progress Notes (Signed)
Attempted to obtain report but RN not available.  Secretary to have ED RN call me for report.  Bed 5M19 is ready for the patient.  Earleen Reaper RN

## 2019-08-29 NOTE — ED Triage Notes (Signed)
Hx of pancreatitis and yesterday pt began to have lower abdominal pain with nausea, vomiting. Pt said he cant  Hold any food or drink down.

## 2019-08-29 NOTE — H&P (Signed)
Date: 08/30/2019               Patient Name:  [name] MRN: [id number]  DOB: 1975/06/29 Age / Sex: 44 y.o., male   PCP: [name], MD         Medical Service: Internal Medicine Teaching Service         Attending Physician: Dr. Aldine Contes, MD    First Contact: Dr. Sanjuan Dame Pager: [phone]  Second Contact: Dr. Gilberto Better Pager: [phone]       After Hours (After 5p/  First Contact Pager: [phone]  weekends / holidays): Second Contact Pager: [phone]   Chief Complaint: Abdominal Pain  History of Present Illness: [name] is a 44 year old man with past medical history significant for CKDIII, recurrent UTIs, HFpEF, T2DM, HTN, DVT/PE (on Eliquis), and HLD who presented to [facility] on 08/29/19 for evaluation of abdominal pain.  He states that for the past few mornings, he has been waking up with mild nausea that would improve after a few hours. His nausea has limited his appetite over the past few mornings, but he otherwise denies any other associated symptoms. Yesterday morning, he woke up with nausea that progressively worsened resulting in multiple episodes of vomiting and the inability to keep down any fluids or food. Throughout the day, he also developed the gradual onset of abdominal pain that he reports is "all over," but particularly bothersome in his lower abdomen. He currently rates his pain as a 9/10 and sharp. He states that his abdominal pain has been constant and not associated with intake of food or fluids. Although he has been frequently doing core exercises at the gym, he states that this pain is different from his usual muscular pain. He reports being mainly concerned of pancreatitis which he had in the past secondary to ozempic use. Otherwise, he denies fevers, diarrhea, pain with urination, urinary frequency, urinary urgency, lightheadedness, dizziness, chest pain, shortness of breath.  Additionally, he complains of pain to his left ankle. He  states that he may have hit his ankle approximately three weeks ago and developed some discoloration to the site which he initially attributed to bruising. Since then, his ankle has been significantly tender to the touch, however he has been able to exercise and walk on it. He has an above the knee amputation of the right lower extremity.  Of note, he was recently admitted to [facility] from 07/30/19 to 07/31/19 for hypervolemia in the setting of worsening CKD and UTI (>100k colonies of gram negative rods.) He was discharged on ciprofloxacin with proper adherence. He follows closely with [facility] for his CKD. He recently had his home lasix increased to 159m AM, 841mPM, metolazone maintained at 2.88m40mvery other day, and advised to limit his potassium intake. He also follows with [facility] for history of chronic recurrent UTIs for which he takes prophylactic trimethoprim. Cystoscopy performed in February of revealed no obstruction, stricture or stones.  ED Course: On arrival to ED, patient was noted to be hypertensive (164/86) but otherwise hemodynamically stable. CBC: WBC 13.2, Hb 10.3; CMP Na 131, K 2.8, Glu 58, BUN 127, Cr 5.47; UA: large leukocytes, >50 WBC, many bacteria; lipase within normal limits. CT Abdomen/Pelvis revealed cholelithiasis without cholecystitis and moderate fecal retention. He received zofran for nausea, morphine for pain, potassium repletion, and mIVF with LR 100m50m. He was admitted to [facility]MTS for further evaluation and management of abdominal pain with intractable nausea and vomiting and suspected  UTI.  Meds:  No current facility-administered medications on file prior to encounter.   Current Outpatient Medications on File Prior to Encounter  Medication Sig Dispense Refill  . Accu-Chek Softclix Lancets lancets Check blood sugar up to 3 times a day 300 each 3  . acidophilus (RISAQUAD) CAPS capsule Take 1 capsule by mouth daily.    Marland Kitchen allopurinol (ZYLOPRIM) 100  MG tablet Take 2 tablets (200 mg total) by mouth daily. 180 tablet 3  . Alpha-Lipoic Acid 600 MG CAPS Take 600 mg by mouth daily.    Marland Kitchen amLODipine (NORVASC) 10 MG tablet Take 10 mg by mouth at bedtime.    . Blood Glucose Monitoring Suppl (ACCU-CHEK GUIDE ME) w/Device KIT Use to check blood sugar up to 3 times a day 1 kit 1  . calcitRIOL (ROCALTROL) 0.5 MCG capsule Take 0.5 mcg by mouth daily.    . carvedilol (COREG) 25 MG tablet Take 1 tablet (25 mg total) by mouth 2 (two) times daily with a meal. 180 tablet 1  . cloNIDine (CATAPRES) 0.3 MG tablet Take 1 tablet (0.3 mg total) by mouth 2 (two) times daily. 180 tablet 1  . Cyanocobalamin (B-12) 500 MCG SUBL Place 500 mcg under the tongue daily.     Marland Kitchen ELIQUIS 5 MG TABS tablet Take 1 tablet by mouth twice daily 180 tablet 0  . famotidine (PEPCID) 20 MG tablet Take 1 tablet (20 mg total) by mouth 2 (two) times daily. (Patient taking differently: Take 20 mg by mouth 2 (two) times daily as needed for heartburn or indigestion. ) 30 tablet 0  . furosemide (LASIX) 80 MG tablet Take 1 tablet (80 mg total) by mouth 2 (two) times daily. 180 tablet 0  . gabapentin (NEURONTIN) 100 MG capsule Take 1 capsule (100 mg total) by mouth 3 (three) times daily. 270 capsule 1  . glucose blood (ACCU-CHEK GUIDE) test strip Check blood sugar up to 3 times a day 300 each 3  . hydrALAZINE (APRESOLINE) 50 MG tablet Take 1 tablet by mouth 4 times daily (Patient taking differently: Take 50 mg by mouth 4 (four) times daily. ) 360 tablet 0  . insulin aspart (NOVOLOG) 100 UNIT/ML FlexPen Inject 25 Units into the skin 3 (three) times daily with meals. 20 unit morning, 25 units lunch 30 units for dinner (Patient taking differently: Inject 20-30 Units into the skin See admin instructions. 20- 25 units in the afternoon and 30 units before bedtime) 8 pen 11  . Insulin Pen Needle (B-D UF III MINI PEN NEEDLES) 31G X 5 MM MISC USE  4 TIMES DAILY 200 each 7  . Insulin Pen Needle (PEN NEEDLES  3/16") 31G X 5 MM MISC Use five times daily 390 each 3  . Lancet Devices (ACCU-CHEK SOFTCLIX) lancets Use to check blood sugars 5 times a day. Dx code:E11.9. Insulin dependent. 6 each 3  . lovastatin (MEVACOR) 20 MG tablet Take 1 tablet by mouth once daily 90 tablet 0  . Melatonin 10 MG TABS Take 10 mg by mouth at bedtime as needed (sleep).    . metolazone (ZAROXOLYN) 2.5 MG tablet Take 2.5 mg by mouth every other day.    . Omega-3 Fatty Acids (FISH OIL) 1000 MG CAPS Take 1,000 mg by mouth at bedtime.     . ondansetron (ZOFRAN ODT) 4 MG disintegrating tablet Take 1 tablet (4 mg total) by mouth every 8 (eight) hours as needed for nausea or vomiting. 20 tablet 0  . oxybutynin (DITROPAN) 5 MG  tablet Take 5 mg by mouth 3 (three) times daily.    . tamsulosin (FLOMAX) 0.4 MG CAPS capsule Take 0.4 mg by mouth at bedtime.     . TRESIBA FLEXTOUCH 200 UNIT/ML FlexTouch Pen INJECT 70 UNITS SUBCUTANEOUSLY ONCE DAILY (Patient taking differently: Inject 96 Units into the skin in the morning. ) 45 mL 1  . trimethoprim (TRIMPEX) 100 MG tablet Take 100 mg by mouth at bedtime.    Marland Kitchen VICTOZA 18 MG/3ML SOPN INJECT 1.2 MG (0.2 MLS) INTO THE SKIN ONCE DAILY AT THE SAME TIME. 6 mL 0   Allergies: Allergies as of 08/29/2019 - Review Complete 08/21/2019  Allergen Reaction Noted  . Reglan [metoclopramide] Other (See Comments) 11/01/2016  . Metformin and related Diarrhea 08/19/2019  . Ozempic (0.25 or 0.5 mg-dose) [semaglutide(0.25 or 0.7m-dos)] Nausea And Vomiting 08/19/2019   Past Medical History:  Diagnosis Date  . Acute venous embolism and thrombosis of deep vessels of proximal lower extremity ([facility] 07/19/2011  . Anemia   . Anginal pain ([facility]    pt denies  . Anxiety   . Chest pain, neg MI, normal coronaries by cath 02/18/2013   pt denies  . CHF (congestive heart failure) ([facility]   . CKD (chronic kidney disease) stage 3, GFR 30-59 ml/min 02/19/2013  . Colon polyps   . Depression   . Diabetic ulcer of right foot  ([facility]   . DVT (deep venous thrombosis) ([facility] 09/2002   patient reports additional DVTs in '06 & '11 (unconfirmed)  . ED (erectile dysfunction)   . GERD (gastroesophageal reflux disease)   . History of blood transfusion    "related to OR" (10/31/2016)  . Hyperlipidemia 02/19/2013  . Hypertension   . Nausea & vomiting    "constant for the last couple weeks" (10/31/2016)  . Nephrotic syndrome   . Obesity    BMI 44, weight 346 pounds 01/30/14  . OSA (obstructive sleep apnea) 02/28/2018   Mild obstructive sleep apnea with an AHI of 9.8/h but severe during REM sleep with an AHI of 33.8/h.  Oxygen saturations dropped to 86% and there was moderate snoring  . Peripheral edema   . Pneumonia   . Prosthesis adjustment 08/17/2016  . Pulmonary embolism ([facility] 09/2002   treated with 6 months of warfarin  . Pyelonephritis 02/02/2018  . Type I diabetes mellitus ([facility] dx'd 2001  . Urinary retention    Family History: -Mother, heart disease and diabetes -Father, medical history not known  Social History: [place]Denies smoking history -Occassional alcohol consumption -Denies recreational drug use  Review of Systems: A complete ROS was negative except as per HPI. Review of Systems  Constitutional: Positive for chills. Negative for fever.  HENT: Negative.   Eyes: Negative.   Respiratory: Negative for sputum production and shortness of breath.   Cardiovascular: Positive for leg swelling. Negative for chest pain and palpitations.  Gastrointestinal: Positive for abdominal pain, nausea and vomiting. Negative for blood in stool, constipation, diarrhea and melena.  Genitourinary: Negative for dysuria, frequency and urgency.  Musculoskeletal: Negative.   Skin:       "Discoloration of ankle"  Neurological: Negative for dizziness and headaches.  Endo/Heme/Allergies: Negative.   Psychiatric/Behavioral: Negative.    Physical Exam: Blood pressure (!) 171/83, pulse 87, temperature 98.7  F (37.1 C), temperature source Oral, resp. rate 18, SpO2 97 %. Physical Exam Constitutional:      General: He is not in acute distress.    Appearance: He is obese. He is  not ill-appearing.  HENT:     Head: Normocephalic and atraumatic.  Eyes:     General: No scleral icterus.    Extraocular Movements: Extraocular movements intact.  Cardiovascular:     Rate and Rhythm: Normal rate and regular rhythm.     Heart sounds: Normal heart sounds. No murmur heard.   Pulmonary:     Effort: Pulmonary effort is normal.     Breath sounds: Normal breath sounds.  Abdominal:     General: Abdomen is flat. Bowel sounds are normal.     Palpations: Abdomen is soft.     Tenderness: There is abdominal tenderness in the left upper quadrant. There is no rebound.     Comments: Tenderness to palpation in LUQ  Musculoskeletal:     Comments: Above the knee amputation to right lower extremity. 1+ pitting edema of LLE. ROM intact of all extremities.  Skin:    General: Skin is warm and dry.     Capillary Refill: Capillary refill takes less than 2 seconds.     Comments: Significantly tender area of hyperpigmentation of left medial ankle. No purulence or ulcerations.  Neurological:     General: No focal deficit present.     Mental Status: He is alert and oriented to person, place, and time.  Psychiatric:        Mood and Affect: Mood normal.        Behavior: Behavior normal.    EKG: personally reviewed my interpretation is normal sinus rhythm  CT Abdomen/Pelvis WO Contrast: Hepatobiliary: Calcified gallstones are identified without cholecystitis. The liver is unremarkable. No biliary dilation. Stomach/Bowel: No bowel obstruction or ileus. Moderate stool throughout the colon. Impression: Cholelithiasis without cholecystitis. Moderate fecal retention.  Assessment & Plan by Problem: Active Problems:   Insulin dependent type 2 diabetes mellitus, controlled (HCC)   Morbid obesity (HCC)   Chronic diastolic  heart failure (HCC)   Abdominal pain   AKI (acute kidney injury) ([place])  #Abdominal pain #Intractable nausea, vomiting Patient reporting gradual onset of nausea, vomiting and generalized abdominal pain yesterday morning resulting in poor PO intake for the past 36 hours. Abdominal examination, lipase, and CT scan not concerning for appendicitis, pancreatitis, or cholecystitis. Etiology of his abdominal pain remains unclear at this time. He does have left-sided abdominal tenderness with moderate fecal retention on CT imaging with last BM three days prior which raises suspicion for constipation contributing to his abdominal discomfort. Alternative etiologies considered include viral gastroenteritis, however he denies associated diarrhea at this time; mesenteric ischemia, however he does not have pain with consumption of food and CT was without contrast secondary to CKD; and euglycemic DKA however he had no ketones in urine and bicarb of 22. Fortunately, he has not had vomiting since admission and is requesting dinner. -Diet ordered, encouraged patient to attempt to have dinner as tolerated. -Zofran PRN for nausea -One time dose of oxycodone 62m and reassess pain control -Docusate/senna BID -Miralax BID -Lactic acid -ESR -CRP -Beta-hydroxybutyric acid -CK  #Cystitis #History of chronic recurrent UTIs Patient follows with urology for chronic recurrent UTIs and takes trimethoprim daily for prophylaxis. UA today reveals large leukocytes, >50 WBC, many bacteria. CBC reveals leukocytosis. Endorsing lower abdominal pain and chills, however denies dysuria, urgency and frequency. He was started on ceftriaxone in the ED. Prior hospitalization in July, patient grew >100k gram negative rods. -Follow-up urine culture -Repeat CBC in AM -Continue ceftriaxone -Continue home trimethoprim 1064mdaily  #AoCKD Baseline creatinine ~5.0 with initial creatinine on presentation  of 5.47. BUN/Cr ratio of 23.3. Acute  kidney injury likely secondary to dehydration in the setting of decreased PO intake with vomiting. -Receiving mIVF now (LR at 150m/hr) -Encouraged to increase fluid intake as tolerated -Holding home diuretic regimen for now.  #Left ankle injury Patient has area of hyperpigmentation of left medial ankle reportedly present for three weeks with significant tenderness to palpation on physical examination. Possibly bruising secondary to known minor trauma to the site. Additionally, cellulitis has been considered but appears less likely at this time. Obtained ankle radiograph which ruled out acute fracture, however demonstrates marked severity diffuse soft tissue swelling. -Continue to monitor -Receiving ceftriaxone for UTI which would provide coverage for cellulitis  #HFpEF, chronic Patient has history of HFpEF with recent increase in home lasix from 892mBID to 16012mM and 29m84m AM. Patient dehydrated in the setting of decreased PO intake for 36 hours and AKI. Receiving IVF right now with plans to reassess volume status. -Holding home lasix 160mg33mand 29mg 63mn setting of AKI -Holding home metolazone 2.5mg ev27m other day in setting of AKI -Daily weights -Strict I's and O's  #T2DM #Diabetic neuropathy Last HbA1c 6.5. Home regimen victoza 1.2mg dai54m tresiba 96u in AM, novolog 20u breakfast, 25u lunch, 30u dinner. -Holding insulin in setting of hypoglycemia (most recent CBG 63) -Resume SSI, resistant scale when eating -Continue home gabapentin  #HTN Hypertensive in ED. -Continue home amlodipine 10mg -Co67mue home carvedilol 25mg bid 73mtinue home clonidine 0.3mg bid -C45minue home hydralazine 50mg qid  #70mia 2/2 CKD Hb 10.3. Stable with baseline. Secondary to CKD. -Continue to monitor  #History of DVT, PE -Continue home eliquis 5mg bid  DVT52mx: Continue home Eliquis 5mg bid Diet:43mear liquid IVF: LR 100mL/hr Code s20ms: Full  Dispo: Admit patient to Inpatient with  expected length of stay greater than 2 midnights.  Signed: Sissy Goetzke, MattheCato Mulligan 12:17 AM  Pager: [phone] and 1pm on weekends: On Call pager: [phone]

## 2019-08-29 NOTE — Hospital Course (Addendum)
73 yom with CKD 4, HFpEF, T2DM presenting for lower abdominal pain associated with n/v x2-3d. No BM x2-3d.    Abdominal pain. Viral gastroenteritis vs uti vs euglycemic dka vs constipation vs rhabdo  LLE pain. Cellulitis vs fracture vs effusion  Acute on chronic CKD IV Hypokalemia--2.8 on admission.   UTI (recurrent)?  T2DM  HFpEF  Tylenol didn't help, throbbing and aching, bothering him all night, 9/10. Denies any nausea, vomiting. No BM.

## 2019-08-29 NOTE — ED Notes (Signed)
Attempted to give reportx1 

## 2019-08-30 ENCOUNTER — Encounter (HOSPITAL_COMMUNITY): Payer: Self-pay | Admitting: Internal Medicine

## 2019-08-30 DIAGNOSIS — R1084 Generalized abdominal pain: Secondary | ICD-10-CM

## 2019-08-30 DIAGNOSIS — Z7901 Long term (current) use of anticoagulants: Secondary | ICD-10-CM

## 2019-08-30 DIAGNOSIS — N185 Chronic kidney disease, stage 5: Secondary | ICD-10-CM

## 2019-08-30 DIAGNOSIS — I132 Hypertensive heart and chronic kidney disease with heart failure and with stage 5 chronic kidney disease, or end stage renal disease: Secondary | ICD-10-CM

## 2019-08-30 DIAGNOSIS — E1122 Type 2 diabetes mellitus with diabetic chronic kidney disease: Secondary | ICD-10-CM

## 2019-08-30 DIAGNOSIS — Z86718 Personal history of other venous thrombosis and embolism: Secondary | ICD-10-CM

## 2019-08-30 DIAGNOSIS — I5032 Chronic diastolic (congestive) heart failure: Secondary | ICD-10-CM

## 2019-08-30 DIAGNOSIS — N179 Acute kidney failure, unspecified: Secondary | ICD-10-CM

## 2019-08-30 DIAGNOSIS — N3000 Acute cystitis without hematuria: Secondary | ICD-10-CM

## 2019-08-30 DIAGNOSIS — E876 Hypokalemia: Secondary | ICD-10-CM

## 2019-08-30 DIAGNOSIS — Z7984 Long term (current) use of oral hypoglycemic drugs: Secondary | ICD-10-CM

## 2019-08-30 DIAGNOSIS — E1140 Type 2 diabetes mellitus with diabetic neuropathy, unspecified: Secondary | ICD-10-CM

## 2019-08-30 LAB — BASIC METABOLIC PANEL
Anion gap: 10 (ref 5–15)
Anion gap: 14 (ref 5–15)
BUN: 126 mg/dL — ABNORMAL HIGH (ref 6–20)
BUN: 124 mg/dL — ABNORMAL HIGH (ref 6–20)
CO2: 26 mmol/L (ref 22–32)
CO2: 24 mmol/L (ref 22–32)
Calcium: 9.1 mg/dL (ref 8.9–10.3)
Calcium: 9 mg/dL (ref 8.9–10.3)
Chloride: 101 mmol/L (ref 98–111)
Chloride: 98 mmol/L (ref 98–111)
Creatinine, Ser: 5.46 mg/dL — ABNORMAL HIGH (ref 0.61–1.24)
Creatinine, Ser: 5.28 mg/dL — ABNORMAL HIGH (ref 0.61–1.24)
GFR calc Af Amer: 14 mL/min — ABNORMAL LOW (ref >60)
GFR calc Af Amer: 14 mL/min — ABNORMAL LOW (ref >60)
GFR calc non Af Amer: 12 mL/min — ABNORMAL LOW (ref >60)
GFR calc non Af Amer: 12 mL/min — ABNORMAL LOW (ref >60)
Glucose, Bld: 92 mg/dL (ref 70–99)
Glucose, Bld: 48 mg/dL — ABNORMAL LOW (ref 70–99)
Potassium: 2.9 mmol/L — ABNORMAL LOW (ref 3.5–5.1)
Potassium: 3.2 mmol/L — ABNORMAL LOW (ref 3.5–5.1)
Sodium: 137 mmol/L (ref 135–145)
Sodium: 136 mmol/L (ref 135–145)

## 2019-08-30 LAB — CK: Total CK: 1689 U/L — ABNORMAL HIGH (ref 49–397)

## 2019-08-30 LAB — CBC
HCT: 30.3 % — ABNORMAL LOW (ref 39.0–52.0)
Hemoglobin: 9.4 g/dL — ABNORMAL LOW (ref 13.0–17.0)
MCH: 27.2 pg (ref 26.0–34.0)
MCHC: 31 g/dL (ref 30.0–36.0)
MCV: 87.8 fL (ref 80.0–100.0)
Platelets: 327 10*3/uL (ref 150–400)
RBC: 3.45 MIL/uL — ABNORMAL LOW (ref 4.22–5.81)
RDW: 14.7 % (ref 11.5–15.5)
WBC: 12.3 10*3/uL — ABNORMAL HIGH (ref 4.0–10.5)
nRBC: 0 % (ref 0.0–0.2)

## 2019-08-30 LAB — GLUCOSE, CAPILLARY
Glucose-Capillary: 100 mg/dL — ABNORMAL HIGH (ref 70–99)
Glucose-Capillary: 138 mg/dL — ABNORMAL HIGH (ref 70–99)
Glucose-Capillary: 63 mg/dL — ABNORMAL LOW (ref 70–99)
Glucose-Capillary: 70 mg/dL (ref 70–99)
Glucose-Capillary: 83 mg/dL (ref 70–99)
Glucose-Capillary: 93 mg/dL (ref 70–99)

## 2019-08-30 LAB — LACTIC ACID, PLASMA: Lactic Acid, Venous: 0.7 mmol/L (ref 0.5–1.9)

## 2019-08-30 LAB — BETA-HYDROXYBUTYRIC ACID: Beta-Hydroxybutyric Acid: 0.33 mmol/L — ABNORMAL HIGH (ref 0.05–0.27)

## 2019-08-30 LAB — HEMOGLOBIN A1C
Hgb A1c MFr Bld: 5.9 % — ABNORMAL HIGH (ref 4.8–5.6)
Mean Plasma Glucose: 122.63 mg/dL

## 2019-08-30 MED ORDER — POTASSIUM CHLORIDE 10 MEQ/100ML IV SOLN: 10.0000 meq | INTRAVENOUS | Status: DC

## 2019-08-30 MED ORDER — MAGNESIUM SULFATE 2 GM/50ML IV SOLN
2.0000 g | Freq: Once | INTRAVENOUS | Status: AC
  Administered 2019-08-30: 2 g via INTRAVENOUS
  Filled 2019-08-30: qty 50

## 2019-08-30 MED ORDER — LACTATED RINGERS IV BOLUS
1000.0000 mL | Freq: Once | INTRAVENOUS | Status: AC
  Administered 2019-08-30: 1000 mL via INTRAVENOUS

## 2019-08-30 MED ORDER — SODIUM CHLORIDE 0.9 % IV SOLN
1.0000 g | INTRAVENOUS | Status: AC
  Administered 2019-08-31 – 2019-09-01 (×2): 1 g via INTRAVENOUS
  Filled 2019-08-30: qty 1
  Filled 2019-08-30: qty 10

## 2019-08-30 MED ORDER — LACTATED RINGERS IV SOLN: INTRAVENOUS | Status: AC

## 2019-08-30 MED ORDER — POTASSIUM CHLORIDE 10 MEQ/100ML IV SOLN
10.0000 meq | INTRAVENOUS | Status: DC
  Administered 2019-08-30 (×3): 10 meq via INTRAVENOUS
  Filled 2019-08-30 (×3): qty 100

## 2019-08-30 MED ORDER — POTASSIUM CHLORIDE 10 MEQ/100ML IV SOLN
10.0000 meq | INTRAVENOUS | Status: AC
  Administered 2019-08-30 (×3): 10 meq via INTRAVENOUS
  Filled 2019-08-30 (×3): qty 100

## 2019-08-30 MED ORDER — POTASSIUM CHLORIDE 10 MEQ/100ML IV SOLN
10.0000 meq | INTRAVENOUS | Status: DC
  Administered 2019-08-30: 10 meq via INTRAVENOUS
  Filled 2019-08-30 (×2): qty 100

## 2019-08-30 NOTE — Progress Notes (Signed)
New Admission Note:   Arrival Method: Via Stretcher from ED Mental Orientation:  A & O x 4 Telemetry: Tele #[id number] - NSR Assessment: Completed Skin:  Refused assessment of groin and sacrum.  Very painful and dark area to left medial area above ankle.             MD made aware. IV: RUA PIV Pain:  C/O Abd Pain Tubes:  None Safety Measures: Safety Fall Prevention Plan has been discussed Admission: Completed 5 MW Orientation: Patient has been orientated to the room, unit and staff.  Family:  None at bedside  Patient has a right AKA.  Usually wears a prosthesis but left at home.  He does have wheelchair from home in the room.  He has his cell phone and charger.  Clothing and shoes are in the room.  Orders have been reviewed and implemented. Will continue to monitor the patient. Call light has been placed within reach and bed alarm has been activated.   Earleen Reaper RN- BC, [facility] Phone number: [phone]

## 2019-08-30 NOTE — Progress Notes (Signed)
MD called and verified that the order for an additional 10 runs of IV K+ was correct.  This is in addition to the 4 Runs of K+ given in the ED.  The order is correct.  Pharmacy made aware.  Will continue to monitor patient.  Earleen Reaper RN

## 2019-08-30 NOTE — Progress Notes (Signed)
Subjective:   [name] did not get much sleep last night. His abdominal pain is improving. Tolerating PO intake.  Follows with urology for recurrent UTIs.  He has not had a BM yet.  Left ankle has been hurting for a couple of weeks. He is still able to ambulate carefully.   Objective:  Vital signs in last 24 hours: Vitals:   08/29/19 2130 08/30/19 0135 08/30/19 0332 08/30/19 0530  BP: (!) 171/83 (!) 161/81 (!) 160/84 (!) 164/94  Pulse: 87 87 85 80  Resp: 18 18 18 18   Temp: 98.7 F (37.1 C) 98 F (36.7 C) 98 F (36.7 C) 98.5 F (36.9 C)  TempSrc: Oral  Oral Oral  SpO2: 97% 96% 98% 98%   General: awake, alert, lying comfortably  Physical Exam Constitutional:      Appearance: Normal appearance. He is well-developed.  HENT:     Head: Normocephalic and atraumatic.  Eyes:     Extraocular Movements: Extraocular movements intact.  Cardiovascular:     Rate and Rhythm: Normal rate.     Pulses: Normal pulses.     Heart sounds: Normal heart sounds.  Pulmonary:     Effort: Pulmonary effort is normal.     Breath sounds: Normal breath sounds.  Abdominal:     General: Bowel sounds are normal. There is no distension.     Palpations: Abdomen is soft.     Tenderness: There is no abdominal tenderness.  Musculoskeletal:        General: Normal range of motion.     Cervical back: Normal range of motion.     Right lower leg: No edema.     Left lower leg: No edema.     Left ankle: Swelling present. Tenderness present over the medial malleolus. Normal pulse.  Skin:    General: Skin is warm and dry.  Neurological:     Mental Status: He is alert and oriented to person, place, and time. Mental status is at baseline.  Psychiatric:        Mood and Affect: Mood normal.    Assessment/Plan:  Active Problems:   Insulin dependent type 2 diabetes mellitus, controlled ([place])   Morbid obesity ([place])   Hypertension   Chronic diastolic heart failure (HCC)   Abdominal pain   AKI (acute  kidney injury) [facility])   [name] is a 44 year old man with past medical history significant for CKDIII, recurrent UTIs, HFpEF, T2DM, HTN, DVT/PE (on Eliquis), and HLD who presented to [facility] on 08/29/19 for evaluation of abdominal pain.  #Abdominal pain #Intractable nausea, vomiting Work up for acute onset abdominal pain has been unremarkable so far. His pain has improved and he is tolerating po intake.  - Advance diet as tolerated - Continue Zofran PRN  #Acute complicated Cystitis: - Continue Ceftriaxone 1 g q 24 hours - D/c ppx dose of bactrim - Urine culture pending.  - Will need follow up with his urologist at d/c  #AoCKD (Stage V) - thought to be prerenal due to decreased PO intake. Only received minimal IV fluids on admission. Will give him LR 100cc/hr for 5 hours and encourage PO intake. I will advance his diet as tolerated.  - Repeat BMP to assess kidney function and electrolytes.   #Hypokalemia: - Repleted on admission - Recheck today.   #Left ankle injury XR of ankle was negative for fracture. Patient is able to put weight on his leg. Will continue conservative management.   #HFpEF, chronic - Continue to  hold Lsix 160mg  PM and 80mg  AM in setting of AKI - Continue to hold home metolazone 2.5mg  every other day in setting of AKI - Daily weights - Strict I's and O's  #T2DM #Diabetic neuropathy Patient may need ot be transitioned to insulin due to his CKD stage 5. He is on victoza, tresiba, and novolog at home. - CBG monitoring q4 hours, continue to hold insulin due to hypoglycemia.  - Resume when he is tolerating food.    #History of DVT, PE -Continue home eliquis 5mg  bid  VTE: Eliquis IVF: LR 100 cc/hr for 5 hours Diet: advance as tolerated  Dispo: Anticipated discharge in approximately 1 day(s).   Marianna Payment, D.O. [facility], PGY-2 Pager: [phone], Phone: [phone] Date 08/30/2019 Time 12:38 PM  Pager:  [phone] After 5pm on weekdays and 1pm on weekends: On Call pager [phone]

## 2019-08-30 NOTE — Progress Notes (Signed)
Paged by Dr. Hal Hope at 2120 requesting IMTS admit pt. He relayed that he had placed orders for bed placement and that pt was in 5M19 at this time. Pt last discharged from IMTS 07/31/19. Agreed to admission. See H/P by Dr. Wynetta Emery for complete plan.  Mitzi Hansen, MD Internal Medicine Resident PGY-2 [facility] Pager: [phone] 08/30/2019 12:10 AM

## 2019-08-31 LAB — GLUCOSE, CAPILLARY
Glucose-Capillary: 118 mg/dL — ABNORMAL HIGH (ref 70–99)
Glucose-Capillary: 96 mg/dL (ref 70–99)
Glucose-Capillary: 67 mg/dL — ABNORMAL LOW (ref 70–99)
Glucose-Capillary: 99 mg/dL (ref 70–99)
Glucose-Capillary: 145 mg/dL — ABNORMAL HIGH (ref 70–99)
Glucose-Capillary: 163 mg/dL — ABNORMAL HIGH (ref 70–99)
Glucose-Capillary: 166 mg/dL — ABNORMAL HIGH (ref 70–99)

## 2019-08-31 LAB — BASIC METABOLIC PANEL
Anion gap: 12 (ref 5–15)
BUN: 121 mg/dL — ABNORMAL HIGH (ref 6–20)
CO2: 24 mmol/L (ref 22–32)
Calcium: 9.2 mg/dL (ref 8.9–10.3)
Chloride: 102 mmol/L (ref 98–111)
Creatinine, Ser: 5.29 mg/dL — ABNORMAL HIGH (ref 0.61–1.24)
GFR calc Af Amer: 14 mL/min — ABNORMAL LOW (ref >60)
GFR calc non Af Amer: 12 mL/min — ABNORMAL LOW (ref >60)
Glucose, Bld: 73 mg/dL (ref 70–99)
Potassium: 3.2 mmol/L — ABNORMAL LOW (ref 3.5–5.1)
Sodium: 138 mmol/L (ref 135–145)

## 2019-08-31 MED ORDER — LACTULOSE 10 GM/15ML PO SOLN
20.0000 g | Freq: Once | ORAL | Status: AC
  Administered 2019-08-31: 20 g via ORAL
  Filled 2019-08-31: qty 30

## 2019-08-31 MED ORDER — POTASSIUM CHLORIDE CRYS ER 20 MEQ PO TBCR
30.0000 meq | EXTENDED_RELEASE_TABLET | Freq: Two times a day (BID) | ORAL | Status: DC
  Administered 2019-08-31 – 2019-09-02 (×5): 30 meq via ORAL
  Filled 2019-08-31 (×5): qty 1

## 2019-08-31 MED ORDER — INSULIN ASPART 100 UNIT/ML ~~LOC~~ SOLN
0.0000 [IU] | Freq: Three times a day (TID) | SUBCUTANEOUS | Status: DC
  Administered 2019-08-31 – 2019-09-01 (×2): 2 [IU] via SUBCUTANEOUS
  Administered 2019-09-01: 1 [IU] via SUBCUTANEOUS

## 2019-08-31 NOTE — Progress Notes (Signed)
Received call from RN stating patient was complaining of 9/10 abdominal pain. Patient received tylenol for pain with no relief. Patient seen at bed side and assessed by myself and Dr. Sherry Ruffing. Patient states the abdominal pain is located in his lower abdomen and it is the same type of pain he has been having, but has worsened. He denies any fevers or other acute changes. Patient notes not having a BM since 4 days ago. He is passing gas and has had little appetite.  Upon abdominal examination, patient had positive bowel sounds. He was tender to palpate in the left upper and lower quadrants with guarding. He was not tender with percussion. After the abdominal exam, the patient began to complain of left sided groin pain. The area was tender to palpate, no hernia present.   Patient states groin pain onset right after abdominal exam, suspect patient strained groin when flinching during abdominal exam. Suspect patient's pain is due to his constipation. Do not suspect acute abdomen at this time. Patient has been refusing miralax, did not receive any doses yesterday. He notes fear of not making it to the bathroom in time and passing his bowels in the bed or on the way to the bathroom as reason for refusal. Lactulose 20 g ordered at this time.

## 2019-08-31 NOTE — Progress Notes (Signed)
Pt c/o abdominal pain at 9/10 and has already had Tylenol. RN messaged MD on call to make aware and they are coming to assess pt at this time. RN will continue to monitor and await further directions.   Eleanora Neighbor, RN

## 2019-08-31 NOTE — Progress Notes (Signed)
   Subjective: Patient had 9/10 abdominal pain overnight. Night team evaluated and felt it to be related to constipation. Lactulose was ordered.  This morning, he states the lactulose did not help him have a BM, only pass more gas. He continues to complain of abdominal pain. Nausea has resolved. He says he normally has multiple BMs per day, but has not had one in 4 days. I explained this was likely contributing to his discomfort, but he did not want to take anything to help him have a BM. He instead was focused on his potassium and wanted to see if repleting this further would help him. I assured him that his renal function is trending back in the right direction.   Objective:  Vital signs in last 24 hours: Vitals:   08/30/19 0950 08/30/19 1815 08/30/19 2039 08/31/19 0416  BP: 122/72 130/68 (!) 150/64 (!) 153/63  Pulse: 78 81 81 81  Resp:  18 18 18   Temp: 98 F (36.7 C) 98.8 F (37.1 C) 98.7 F (37.1 C) 98.9 F (37.2 C)  TempSrc: Oral Oral Oral Oral  SpO2: 94% 93% 93% 90%   General: obese gentleman, lying in bed in NAD CV: RRR Abd: normoactive bowel sounds; abdomen is soft with mild tenderness across lower quadrants   Assessment/Plan:  Active Problems:   Insulin dependent type 2 diabetes mellitus, controlled (HCC)   Morbid obesity (HCC)   Hypertension   Chronic diastolic heart failure (HCC)   Abdominal pain   AKI (acute kidney injury) [facility])  [name] is a 44 year old man with past medical history significant for CKDIII, recurrent UTIs, HFpEF, T2DM, HTN, DVT/PE (on Eliquis), and HLD who presented to [facility] on 08/29/19 for evaluation of abdominal pain.  #Abdominal pain #Intractable nausea, vomiting Work up for acute onset abdominal pain has been unremarkable so far. His nausea has resolved, but abdominal pain persists. I suspect it is related to constipation, but he has been refusing bowel regimen. Will continue to encourage this, as well as high fiber diet.  - Continue  Zofran PRN  #Acute complicated Cystitis: - Continue Ceftriaxone 1 g q 24 hours - Urine culture pending.  - Will need follow up with his urologist at d/c  #AoCKD (Stage V) - thought to be prerenal due to decreased PO intake. Trending back down to baseline with IVF. Continue to encourage PO intake.  - Repeat BMP to assess kidney function and electrolytes.   #Hypokalemia: - improving with repletion; up to 3.2 today. Will replete orally.   #Left ankle injury XR of ankle was negative for fracture. Patient is able to put weight on his leg. Will continue conservative management.   #HFpEF, chronic - Continue to hold Lsix 160mg  PM and 80mg  AM in setting of AKI - Continue to hold home metolazone 2.5mg  every other day in setting of AKI - Daily weights - Strict I's and O's  #T2DM #Diabetic neuropathy Patient may need to be transitioned to insulin due to his CKD stage 5. He is on victoza, tresiba, and novolog at home. - will initiate SSI now that he is tolerating PO  #History of DVT, PE -Continue home eliquis 5mg  bid  Dispo: Anticipated discharge in approximately 1 day(s).   Modena Nunnery D, DO 08/31/2019, 7:15 AM Pager: [phone] After 5pm on weekdays and 1pm on weekends: On Call pager [phone]

## 2019-09-01 DIAGNOSIS — S99912A Unspecified injury of left ankle, initial encounter: Secondary | ICD-10-CM

## 2019-09-01 DIAGNOSIS — E119 Type 2 diabetes mellitus without complications: Secondary | ICD-10-CM

## 2019-09-01 LAB — BASIC METABOLIC PANEL
Anion gap: 13 (ref 5–15)
BUN: 117 mg/dL — ABNORMAL HIGH (ref 6–20)
CO2: 23 mmol/L (ref 22–32)
Calcium: 9.2 mg/dL (ref 8.9–10.3)
Chloride: 102 mmol/L (ref 98–111)
Creatinine, Ser: 5.09 mg/dL — ABNORMAL HIGH (ref 0.61–1.24)
GFR calc Af Amer: 15 mL/min — ABNORMAL LOW (ref >60)
GFR calc non Af Amer: 13 mL/min — ABNORMAL LOW (ref >60)
Glucose, Bld: 125 mg/dL — ABNORMAL HIGH (ref 70–99)
Potassium: 3.7 mmol/L (ref 3.5–5.1)
Sodium: 138 mmol/L (ref 135–145)

## 2019-09-01 LAB — GLUCOSE, CAPILLARY
Glucose-Capillary: 132 mg/dL — ABNORMAL HIGH (ref 70–99)
Glucose-Capillary: 156 mg/dL — ABNORMAL HIGH (ref 70–99)
Glucose-Capillary: 143 mg/dL — ABNORMAL HIGH (ref 70–99)
Glucose-Capillary: 207 mg/dL — ABNORMAL HIGH (ref 70–99)

## 2019-09-01 LAB — CK: Total CK: 770 U/L — ABNORMAL HIGH (ref 49–397)

## 2019-09-01 MED ORDER — INSULIN ASPART 100 UNIT/ML ~~LOC~~ SOLN
0.0000 [IU] | Freq: Three times a day (TID) | SUBCUTANEOUS | Status: DC
  Administered 2019-09-02 (×2): 1 [IU] via SUBCUTANEOUS

## 2019-09-01 NOTE — Progress Notes (Signed)
   Subjective:   No acute events overnight. Refused his bowel regimen yesterday and this morning.  States he hasn't had an appetite since yesterday, has been drinking water only. Reports he has not had a BM since last Wednesday and usually moves his bowels multiple times a day. Abdominal pain he had yesterday is much improved though still present. States he has not taken the bowel regime because he is afraid of not making it to the restroom in time and having an accident.  After reassurane provided, states he is just ready to "release these burdens." Denies lower urinary tract symptoms, "I pee like a horse."   Objective:  Vital signs in last 24 hours: Vitals:   08/31/19 1630 08/31/19 2027 08/31/19 2239 09/01/19 0607  BP: (!) 146/82 (!) 160/69  (!) 161/78  Pulse: 84 81  93  Resp: 18 20  19   Temp: 98.8 F (37.1 C) 97.9 F (36.6 C)  98.6 F (37 C)  TempSrc: Oral Oral  Oral  SpO2: 94% 92%  92%  Height:   6\' 2"  (1.88 m)    General: obese gentleman, lying in bed in NAD, high energy CV: RRR Abd: normoactive bowel sounds; abdomen is soft with mild tenderness in the lower quadrants and LLQ  Assessment/Plan:  Active Problems:   Insulin dependent type 2 diabetes mellitus, controlled (HCC)   Morbid obesity (HCC)   Hypertension   Chronic diastolic heart failure (HCC)   Abdominal pain   AKI (acute kidney injury) [facility])  [name] is a 44 year old man with past medical history significant for CKDIII, recurrent UTIs, HFpEF, T2DM, HTN, DVT/PE (on Eliquis), and HLD who presented to [facility] on 08/29/19 for evaluation of abdominal pain. He will likely be able to discharge tomorrow if he is able to have a BM.  This is hospital day 3.   Abdominal pain Intractable nausea, vomiting Abdominal pain and nausea improved today, though patient endorsing poor appetite. Still no BM. Patient has been refusing his bowel regimen secondary to anxiety about having an accident. Reassurance provided,  patient states he will try the prescribed regimen.  - senna-docusate 2 tablets BID - Miralax BID - Zofran PRN  #Acute complicated Cystitis: - Continue Ceftriaxone 1 g q 24 hours, will transition to PO at discharge - Urine culture with >100k CFU gram negative rods - Will need follow up with his urologist at d/c  #AoCKD (Stage V) - thought to be prerenal due to decreased PO intake. Trending back down to baseline with IVF. Continue to encourage PO intake.  - Repeat BMP to assess kidney function and electrolytes.   #Hypokalemia: - improving with repletion; up to 3.7 today. Will replete orally.   #Left ankle injury XR of ankle was negative for fracture. Patient is able to put weight on his leg. Will continue conservative management.   #HFpEF, chronic - Continue to hold Lasix 160mg  PM and 80mg  AM in setting of AKI - Continue to hold home metolazone 2.5mg  every other day in setting of AKI - Daily weights - Strict I's and O's  #T2DM #Diabetic neuropathy Patient may need to be transitioned to insulin due to his CKD stage 5. He is on victoza, tresiba, and novolog at home. - SSI very sensitive (ESRD/Dialysis)  #History of DVT, PE -Continue home eliquis 5mg  bid  Dispo: Anticipated discharge in approximately 1 day(s).   [name], MD 09/01/2019, 6:15 AM Pager: [phone] After 5pm on weekdays and 1pm on weekends: On Call pager [phone]

## 2019-09-01 NOTE — Consult Note (Signed)
   [facility] CM Inpatient Consult   09/01/2019  [name] 01-14-1975 [id number]   [place] [facility] [ACO] Patient:  Medicare NextGen   Patient screened for high risk score for unplanned readmission  and for less than 30 days readmission hospitalizations.  Review of patient's medical record reveals patient is active in Chronic Care Management with the Embedded Team at [facility].  Primary Care Provider is  [name], MD this provider office is listed to provide the transition of care [TOC] for post hospital follow up.  Plan:  To notify his Chronic Care Management team member and make aware of his admission status.  Continue to follow progress and disposition to assess for post hospital care management needs and updates for changes.  For questions contact:   [name], RN BSN [facility] Liaison  [phone] business mobile phone Toll free office [phone]  Fax number: [phone] [email or web address]

## 2019-09-01 NOTE — Evaluation (Signed)
Physical Therapy Evaluation Patient Details Name: [name] MRN: [id number] DOB: 10/31/1975 Today's Date: 09/01/2019   History of Present Illness  The pt is a 44 yo male presenting with abdominal pain, nausea, and vomiting. PMH includes: CKD IV, recurrent UTIs, CHF, DM II, HTN, DVT/PE on anticoagulation, HLD, and morbid obesity.    Clinical Impression  Pt in bed upon arrival of PT, agreeable to evaluation at this time. Prior to admission the pt was mobilizing with use of a WC without his prosthetic or ambulating with a RW with his prosthetic in the home. He reports having a home health aide 5 days/week for 8 hours/day and going to the gym 6 days/week and is eager to return to his exercise program. The pt now presents with minor limitations in functional mobility, balance, and strenght due to above dx, and will continue to benefit from skilled PT to address these deficits but is safe to return home when medically clear for d/c. The pt was able to demo good independence with transfer to his WC, but declined further ambulation due to the fact that his prosthetic is at home. The pt will continue to benefit from skilled PT for mobility and exercise progression while admitted to maintain his strength and independence.       Follow Up Recommendations No PT follow up;Supervision - Intermittent    Equipment Recommendations   (pt has needed equipment, resume [place] aide)    Recommendations for Other Services       Precautions / Restrictions Precautions Precautions: Other (comment) Precaution Comments: R BKA (prior) but prosthetic at home Restrictions Weight Bearing Restrictions: No      Mobility  Bed Mobility Overal bed mobility: Independent                Transfers Overall transfer level: Modified independent Equipment used: None             General transfer comment: set-up of WC placement, then pt independent with lateral scoot (only does sit-stand with RW with his prosthetic  which is at home)  Ambulation/Gait             General Gait Details: pt declined as he only ambulates with RW with his prosthertic which is at home     Balance Overall balance assessment: Mild deficits observed, not formally tested                                           Pertinent Vitals/Pain Pain Assessment: 0-10 Pain Score: 7  Pain Location: lower abdomen Pain Descriptors / Indicators: Throbbing Pain Intervention(s): Monitored during session;Limited activity within patient's tolerance;Repositioned    Home Living Family/patient expects to be discharged to:: Private residence Living Arrangements: Alone Available Help at Discharge: Available PRN/intermittently ([place] aide 8 hours mon-fri, or as needed) Type of Home: Apartment Home Access: Level entry     Home Layout: One level Home Equipment: Walker - 2 wheels;Wheelchair - manual;Tub bench;Grab bars - tub/shower;Grab bars - toilet      Prior Function Level of Independence: Needs assistance   Gait / Transfers Assistance Needed: ambulates short distance with RW, WC for mobility. able to transfer independently  ADL's / Homemaking Assistance Needed: [facility] aide assists with bathing "hard to reach places" and dressing        Hand Dominance   Dominant Hand: Right    Extremity/Trunk Assessment   Upper Extremity  Assessment Upper Extremity Assessment: Overall WFL for tasks assessed    Lower Extremity Assessment Lower Extremity Assessment: RLE deficits/detail;Overall WFL for tasks assessed RLE Deficits / Details: BKA (good strength in hip and at knee) pt able to don prosthetic independently       Communication   Communication: No difficulties  Cognition Arousal/Alertness: Awake/alert Behavior During Therapy: WFL for tasks assessed/performed Overall Cognitive Status: Within Functional Limits for tasks assessed                                        General Comments       Exercises General Exercises - Lower Extremity Long Arc Quad: Strengthening;10 reps;Seated;Left (2 x 10 against min resistance) Heel Slides: Strengthening;Left;Seated;10 reps (2 x 10 against min resistance) Other Exercises Other Exercises: Banded pull-outs 3 sets of 40 sec on, 20 sec off. (green TB) Other Exercises: Banded lateral trunk rotations 3 sets of 40 sec on and 20 sec off (both sides, green TB)   Assessment/Plan    PT Assessment Patient needs continued PT services  PT Problem List Decreased strength;Decreased balance;Decreased mobility       PT Treatment Interventions Therapeutic exercise;Functional mobility training;Therapeutic activities;Patient/family education;Balance training    PT Goals (Current goals can be found in the Care Plan section)  Acute Rehab PT Goals Patient Stated Goal: return to working out PT Goal Formulation: With patient Time For Goal Achievement: 09/15/19 Potential to Achieve Goals: Good    Frequency Min 3X/week   Barriers to discharge           AM-PAC PT "6 Clicks" Mobility  Outcome Measure Help needed turning from your back to your side while in a flat bed without using bedrails?: None Help needed moving from lying on your back to sitting on the side of a flat bed without using bedrails?: None Help needed moving to and from a bed to a chair (including a wheelchair)?: None Help needed standing up from a chair using your arms (e.g., wheelchair or bedside chair)?: A Lot Help needed to walk in hospital room?: A Lot Help needed climbing 3-5 steps with a railing? : Total 6 Click Score: 17    End of Session   Activity Tolerance: Patient tolerated treatment well Patient left: in chair;with nursing/sitter in room (in his personal WC) Nurse Communication: Mobility status PT Visit Diagnosis: Muscle weakness (generalized) (M62.81);Other abnormalities of gait and mobility (R26.89)    Time: 1517-6160 PT Time Calculation (min) (ACUTE ONLY): 46  min   Charges:   PT Evaluation $PT Eval Low Complexity: 1 Low PT Treatments $Gait Training: 8-22 mins $Therapeutic Exercise: 8-22 mins        Karma Ganja, PT, DPT   Acute Rehabilitation Department Pager #: [phone]  Otho Bellows 09/01/2019, 1:16 PM

## 2019-09-02 LAB — URINE CULTURE: Culture: >=100000 — AB

## 2019-09-02 LAB — BASIC METABOLIC PANEL
Anion gap: 13 (ref 5–15)
BUN: 116 mg/dL — ABNORMAL HIGH (ref 6–20)
CO2: 21 mmol/L — ABNORMAL LOW (ref 22–32)
Calcium: 9 mg/dL (ref 8.9–10.3)
Chloride: 102 mmol/L (ref 98–111)
Creatinine, Ser: 4.67 mg/dL — ABNORMAL HIGH (ref 0.61–1.24)
GFR calc Af Amer: 17 mL/min — ABNORMAL LOW (ref >60)
GFR calc non Af Amer: 14 mL/min — ABNORMAL LOW (ref >60)
Glucose, Bld: 181 mg/dL — ABNORMAL HIGH (ref 70–99)
Potassium: 3.7 mmol/L (ref 3.5–5.1)
Sodium: 136 mmol/L (ref 135–145)

## 2019-09-02 LAB — GLUCOSE, CAPILLARY
Glucose-Capillary: 188 mg/dL — ABNORMAL HIGH (ref 70–99)
Glucose-Capillary: 174 mg/dL — ABNORMAL HIGH (ref 70–99)
Glucose-Capillary: 188 mg/dL — ABNORMAL HIGH (ref 70–99)

## 2019-09-02 MED ORDER — OXYCODONE HCL 10 MG PO TABS: 10.0000 mg | ORAL_TABLET | Freq: Four times a day (QID) | ORAL | 0 refills | Status: DC | PRN

## 2019-09-02 MED ORDER — CEPHALEXIN 500 MG PO CAPS: 500.0000 mg | ORAL_CAPSULE | Freq: Four times a day (QID) | ORAL | 0 refills | Status: AC

## 2019-09-02 MED ORDER — SENNOSIDES-DOCUSATE SODIUM 8.6-50 MG PO TABS: 2.0000 | ORAL_TABLET | Freq: Two times a day (BID) | ORAL | 0 refills | Status: DC

## 2019-09-02 MED ORDER — POLYETHYLENE GLYCOL 3350 17 G PO PACK: 17.0000 g | PACK | Freq: Two times a day (BID) | ORAL | 0 refills | Status: DC

## 2019-09-02 MED ORDER — ONDANSETRON HCL 4 MG PO TABS: 4.0000 mg | ORAL_TABLET | Freq: Four times a day (QID) | ORAL | 0 refills | Status: DC | PRN

## 2019-09-02 NOTE — Discharge Summary (Signed)
Name: [name] MRN: [id number] DOB: 1975-06-01 44 y.o. PCP: [name], MD  Date of Admission: 08/29/2019  6:40 AM Date of Discharge: 09/02/2019 Attending Physician: Dr. Gilles Chiquito  Discharge Diagnosis:  1. Abdominal pain and nausea secondary to constipation 2. Acute complicated cystitis 3. Acute on chronic CKD (Stage V) 4. Hypokalemia, resolved  Discharge Medications:    Reactions   Reglan [metoclopramide] Other (See Comments)   Dysphoric reaction   Metformin And Related Diarrhea   Ozempic (0.25 Or 0.5 Mg-dose) [semaglutide(0.25 Or 0.30m-dos)] Nausea And Vomiting      Medication List    STOP taking these medications   ondansetron 4 MG disintegrating tablet Commonly known as: Zofran ODT   trimethoprim 100 MG tablet Commonly known as: TRIMPEX     TAKE these medications   Accu-Chek Guide Me w/Device Kit Use to check blood sugar up to 3 times a day What changed:   how much to take  how to take this  when to take this  additional instructions   Accu-Chek Guide test strip Generic drug: glucose blood Check blood sugar up to 3 times a day What changed:   how much to take  how to take this  when to take this  additional instructions   accu-chek softclix lancets Use to check blood sugars 5 times a day. Dx code:E11.9. Insulin dependent. What changed:   how much to take  how to take this  when to take this   Accu-Chek Softclix Lancets lancets Check blood sugar up to 3 times a day What changed:   how much to take  how to take this  when to take this  additional instructions   acidophilus Caps capsule Take 1 capsule by mouth daily.   allopurinol 100 MG tablet Commonly known as: ZYLOPRIM Take 2 tablets (200 mg total) by mouth daily.   Alpha-Lipoic Acid 600 MG Caps Take 600 mg by mouth daily.   amLODipine 10 MG tablet Commonly known as: NORVASC Take 10 mg by mouth at bedtime.   B-12 500 MCG Subl Place 500 mcg under the  tongue daily.   calcitRIOL 0.5 MCG capsule Commonly known as: ROCALTROL Take 2.5 mcg by mouth daily.   carvedilol 25 MG tablet Commonly known as: COREG Take 1 tablet (25 mg total) by mouth 2 (two) times daily with a meal.   cephALEXin 500 MG capsule Commonly known as: KEFLEX Take 1 capsule (500 mg total) by mouth 4 (four) times daily for 4 days.   cloNIDine 0.3 MG tablet Commonly known as: CATAPRES Take 1 tablet (0.3 mg total) by mouth 2 (two) times daily.   Eliquis 5 MG Tabs tablet Generic drug: apixaban Take 1 tablet by mouth twice daily What changed: how much to take   famotidine 20 MG tablet Commonly known as: PEPCID Take 1 tablet (20 mg total) by mouth 2 (two) times daily. What changed:   when to take this  reasons to take this   Fish Oil 1000 MG Caps Take 1,000 mg by mouth at bedtime.   furosemide 80 MG tablet Commonly known as: LASIX Take 1 tablet (80 mg total) by mouth 2 (two) times daily. What changed:   how much to take  additional instructions   gabapentin 100 MG capsule Commonly known as: NEURONTIN Take 1 capsule (100 mg total) by mouth 3 (three) times daily.   hydrALAZINE 50 MG tablet Commonly known as: APRESOLINE Take 1 tablet by mouth 4 times daily What changed: See the new  instructions.   insulin aspart 100 UNIT/ML FlexPen Commonly known as: NOVOLOG Inject 25 Units into the skin 3 (three) times daily with meals. 20 unit morning, 25 units lunch 30 units for dinner What changed:   how much to take  when to take this  additional instructions   lovastatin 20 MG tablet Commonly known as: MEVACOR Take 1 tablet by mouth once daily What changed: when to take this   Melatonin 10 MG Tabs Take 10 mg by mouth at bedtime as needed (sleep).   metolazone 2.5 MG tablet Commonly known as: ZAROXOLYN Take 2.5 mg by mouth every other day.   ondansetron 4 MG tablet Commonly known as: ZOFRAN Take 1 tablet (4 mg total) by mouth every 6 (six)  hours as needed for nausea.   oxybutynin 5 MG tablet Commonly known as: DITROPAN Take 25 mg by mouth 3 (three) times daily.   Oxycodone HCl 10 MG Tabs Take 1 tablet (10 mg total) by mouth every 6 (six) hours as needed for up to 5 days for severe pain.   Pen Needles 3/16" 31G X 5 MM Misc Use five times daily What changed:   how much to take  how to take this  when to take this   B-D UF III MINI PEN NEEDLES 31G X 5 MM Misc Generic drug: Insulin Pen Needle USE  4 TIMES DAILY What changed:   how much to take  how to take this  when to take this  additional instructions   polyethylene glycol 17 g packet Commonly known as: MIRALAX / GLYCOLAX Take 17 g by mouth 2 (two) times daily.   senna-docusate 8.6-50 MG tablet Commonly known as: Senokot-S Take 2 tablets by mouth 2 (two) times daily.   tamsulosin 0.4 MG Caps capsule Commonly known as: FLOMAX Take 0.4 mg by mouth at bedtime.   Tyler Aas FlexTouch 200 UNIT/ML FlexTouch Pen Generic drug: insulin degludec INJECT 70 UNITS SUBCUTANEOUSLY ONCE DAILY What changed: See the new instructions.   Victoza 18 MG/3ML Sopn Generic drug: liraglutide INJECT 1.2 MG (0.2 MLS) INTO THE SKIN ONCE DAILY AT THE SAME TIME. What changed:   how much to take  how to take this  when to take this       Disposition and follow-up:   [name] was discharged from [facility] in Good condition.  At the hospital follow up visit please address:  1.    - Hypokalemia: please evaluate need for chronic potassium supplementation on diuretic therapy  - Acute complicated cystitis: follow-up with his urologist needs to be arranged - [name] has requested that his hospital records be shared with his nephrologist at [facility] - Has not received his COVID vaccine. Expressed hesitation citing uncertainty around its speedy development. His questions were answered. Would certainly revisit with him.  2.   Labs / imaging needed at time of follow-up:   - BMP (creatinine, potassium)  3.  Pending labs/ test needing follow-up:   none  Follow-up Appointments:   - Follow-up with his urologist at [facility] needs to be arranged  Hospital Course by problem list:  [name] is a 44 year old man with past medical history significant for CKDIII, recurrent UTIs, chronic diastolic heart failure, type 2 diabetes, hypertension, DVT/PE (on Eliquis), and hyperlipidemia who presented to the [facility] ED on 08/29/19 for evaluation of abdominal pain.   Abdominal pain Intractable nausea, vomiting On admission reported that over the last couple of prior to admission  he experienced mild nausea in the mornings that would usually improve. On the day prior to his admission he woke up with nausea the progressively worsened and he had multiple episodes of vomiting and was unable to tolerate PO. This was associated with abdominal pain that was diffuse but worse in the lower abdomen. Abdominal exam, lipase, and CT scan not concerning for appendicitis, pancreatitis, cholecystitis, or other etiologies of an acute abdomen. CT imaging remarkable for moderate fecal retention. Treated with IVF and Zofran. Initially hesitant to take a bowel regimen secondary to anxiety over having an accident given his limited mobility, however eventually he took Miralax and senna and had a BM on the day prior to discharge with improvement in his pain and nausea. Abdominal pain and nausea improved today after BM yesterday. The patient was concerned about the presence of calcified gallstones without cholecystitis on his CT abdomen pelvis.Discussed dietary modifications for gallstones without cholecystitis. Discussed that unfortunately with the pandemic, all elective procedures have been put on hold.  - Discharged with limited prescription for Zofran - Counseled regarding OTC bowel regimen  Acute complicated Cystitis: He also follows with  [facility] for history of chronic recurrent UTIs for which he takes prophylactic trimethoprim. Cystoscopy performed in February of revealed no obstruction, stricture or stones. During this admission, positive UA (large LE, >50 WBC, many bacteria) however no lower urinary tract symptoms.  Considered that abdominal pain, especially lower abdominal pain, from UTI. Treated with ceftriaxone and prophylactic trimethoprim held. Urine culture positive for >100k CFU Klebsiella pneumoniae.  - Discharged with instructions to complete 4 additional days of cephalexin and continue to hold trimethoprim while taking cephalexin - Will need follow up with his urologist at [facility]  Acute on CKD(Stage V) Baseline creatinine ~5.0 with initial creatinine on presentation of 5.47, GFR 14. BUN/Cr ratio of 23.3. Acute kidney injury likely secondary to dehydration in the setting of decreased PO intake with vomiting. Treated with IVF, and home Lasix 160 mg PM and 80 mg AM as well as metolazone held in the setting of his AKI. Creatinine and GFR improved to 4.67 and 17, respectively. - Will need follow-up with his nephrologist   Hypokalemia - Patient hypokalemic to 2.8 on admission, improved with repletion, stable at 3.7 at discharge - Please evaluate the need for chronic potassium supplementation on diuretic therapy  Left ankle injury Patient has area of hyperpigmentation of left medial ankle reportedly present for three weeks with significant tenderness to palpation on physical examination. Possibly bruising secondary to known minor trauma to the site. Additionally, cellulitis has been considered but appears less likely at this time. Obtained ankle radiograph which ruled out acute fracture, however demonstrates marked severity diffuse soft tissue swelling. Patient is able to put weight on his leg.   HFpEF, chronic He recently had his home lasix increased to 151m AM, 89mPM, metolazone maintained at 2.56m756mevery other day, and advised to limit his potassium intake. These were held in setting of AKI. At discharged, resumed home medications. - Patient hypokalemic on admission, improved with repletion, stable at 3.7 at discharge - As above, please evaluate the need for chronic potassium supplementation on diuretic therapy  T2DM Diabetic neuropathy Last HbA1c 6.5. Home regimen victoza 1.2mg33mily, tresiba 96u in AM, novolog 20u breakfast, 25u lunch, 30u dinner. Insulin held initially secondary to poor PO intake however subsequently resumed.   Discharge Vitals:   BP (!) 165/62 (BP Location: Left Arm)    Pulse 89  Temp 99.3 F (37.4 C) (Oral)    Resp 20    Ht 6' 2"  (1.88 m)    Wt (!) 167 kg    SpO2 93%    BMI 47.27 kg/m   Pertinent Labs, Studies, and Procedures:   CT Abdomen/Pelvis WO Contrast: Hepatobiliary: Calcified gallstones are identified without cholecystitis. The liver is unremarkable. No biliary dilation. Stomach/Bowel: No bowel obstruction or ileus. Moderate stool throughout the colon. Impression: Cholelithiasis without cholecystitis. Moderate fecal retention.  LEFT ANKLE COMPLETE - 3+ VIEW  COMPARISON:  None.  FINDINGS: There is no evidence of an acute fracture, dislocation, or joint effusion. Mild to moderate severity degenerative changes seen along the dorsal aspect of the proximal left foot. Marked severity diffuse soft tissue swelling is seen.  IMPRESSION: Marked severity diffuse soft tissue swelling without evidence of acute fracture.  Discharge Instructions: Discharge Instructions    Diet - low sodium heart healthy   Complete by: As directed    Discharge instructions   Complete by: As directed    [name], it was a pleasure taking care of you. You were treated for a UTI which you will complete the treatment with 4 more days of antibiotics.  Regarding your gallstones, the best thing to do for the time being is pain and nausea management with medications, as  well as avoiding fatty foods as much as possible. Unfortunately, with the COVID pandemic, all elective procedures are being put on hold.  We will arrange for follow-up in clinic within the next week.   Take care!   Increase activity slowly   Complete by: As directed       Signed: [name], MD 09/02/2019, 10:10 PM   Pager: [phone]

## 2019-09-02 NOTE — Progress Notes (Signed)
DISCHARGE NOTE HOME Ly Dreisbach to be discharged Home per MD order. Discussed prescriptions and follow up appointments with the patient. Prescriptions given to patient; medication list explained in detail. Patient verbalized understanding.  IV catheter discontinued intact. Site without signs and symptoms of complications. Dressing and pressure applied. Pt denies pain at the site currently. No complaints noted.  An After Visit Summary (AVS) was printed and given to the patient. Patient escorted via wheelchair, and discharged home via private auto.  Orville Govern, RN3

## 2019-09-02 NOTE — Progress Notes (Signed)
   Subjective:   No acute events overnight. [name] did have a BM yesterday, but still having abdominal pain and nausea. He questioned his gallstones and what the recommended management is. Unfortunately with the pandemic, all elective procedures have been put on hold.  In the mean time, the best way to manage his pain is avoiding food that are high in fat content and medications to better control the pain.  COVID vaccine was offered which he is considering.   Objective:  Vital signs in last 24 hours: Vitals:   09/01/19 1631 09/01/19 2036 09/02/19 0441 09/02/19 0855  BP: (!) 145/73 (!) 155/61 (!) 148/82 (!) 165/62  Pulse: 84 86 85 89  Resp: 18 16 18 20   Temp: 99 F (37.2 C) 98.6 F (37 C) 98.9 F (37.2 C) 99.3 F (37.4 C)  TempSrc: Oral Oral Oral Oral  SpO2: 91% 94% 99% 93%  Weight:  (!) 167 kg (!) 167 kg   Height:       General: obese gentleman, sitting upright in bed, in no acute distress CV: RRR Abd: normoactive bowel sounds; abdomen is soft with mild tenderness in the lower quadrants and LLQ  Assessment/Plan:  Active Problems:   Insulin dependent type 2 diabetes mellitus, controlled (HCC)   Morbid obesity (HCC)   Hypertension   Chronic diastolic heart failure (HCC)   Abdominal pain   AKI (acute kidney injury) [facility])  [name] is a 44 year old man with past medical history significant for CKDIII, recurrent UTIs, HFpEF, T2DM, HTN, DVT/PE (on Eliquis), and HLD who presented to [facility] on 08/29/19 for evaluation of abdominal pain.   This is hospital day 3. He has had a BM and is otherwise stable for discharge home today.  Abdominal pain Intractable nausea, vomiting Abdominal pain and nausea improved today after BM yesterday. Discussed dietary modifications for gallstones without cholecystitis. - Will discharge with Zofran and close PCP follow-up  #Acute complicated Cystitis: - Continue Ceftriaxone 1 g q 24 hours, will transition to PO (cephalexin) at  discharge - Urine culture with >100k CFU gram negative rods - Will need follow up with his urologist at d/c  #AoCKD (Stage V) - thought to be prerenal due to decreased PO intake. Creatinine trending back down to baseline with IVF, 4.67 this AM. Continue to encourage PO intake.   #Hypokalemia: - improving with repletion; stable at 3.7 this morning.   #Left ankle injury XR of ankle was negative for fracture. Patient is able to put weight on his leg. Will continue conservative management.   #HFpEF, chronic - Continue to hold Lasix 160mg  PM and 80mg  AM in setting of AKI - Continue to hold home metolazone 2.5mg  every other day in setting of AKI - Daily weights - Strict I's and O's  #T2DM #Diabetic neuropathy Patient may need to be transitioned to insulin due to his CKD stage 5. He is on victoza, tresiba, and novolog at home.  #History of DVT, PE -Continue home eliquis 5mg  bid  Dispo: Anticipated discharge today.  Victor Lodge, MD 09/02/2019, 7:47 PM Pager: [phone] After 5pm on weekdays and 1pm on weekends: On Call pager [phone]

## 2019-09-04 ENCOUNTER — Telehealth: Payer: Medicare Other

## 2019-09-04 ENCOUNTER — Telehealth: Payer: Self-pay | Admitting: *Deleted

## 2019-09-04 NOTE — Telephone Encounter (Addendum)
  Chronic Care Management   Outreach Note  09/04/2019 Name: [name] MRN: [id number] DOB: 1975/03/16  Referred by: [name], MD Reason for referral : Chronic Care Management (IDDM, HLD, CKD, HF)   An unsuccessful telephone outreach was attempted today to complete hospital discharge transition of care assessment. Patient was hospitalized at [facility] from 8/20-8/24 for abdominal pain with n/v related to constipation with acute kidney injury.  Follow Up Plan: A HIPPA compliant phone message was left for the patient providing contact information and requesting a return call.  The care management team will reach out to the patient again over the next 3-5 days.   Kelli Churn RN, CCM, [facility] RN Care Manager [phone]

## 2019-09-05 ENCOUNTER — Ambulatory Visit: Payer: Medicare Other | Admitting: *Deleted

## 2019-09-05 DIAGNOSIS — I10 Essential (primary) hypertension: Secondary | ICD-10-CM

## 2019-09-05 DIAGNOSIS — E785 Hyperlipidemia, unspecified: Secondary | ICD-10-CM

## 2019-09-05 DIAGNOSIS — E119 Type 2 diabetes mellitus without complications: Secondary | ICD-10-CM

## 2019-09-05 NOTE — Chronic Care Management (AMB) (Addendum)
  Chronic Care Management   Note  09/05/2019 Name: [name] MRN: [id number] DOB: 1975/04/02   Successful outreach to patient after he returned call and left message in response to a message left by this CCM RN on 09/05/19.  Completed transition of care assessment. Patient was hospitalized at [facility] from 8/2--8/24 for abdominal pain with n/v, UTI and AKI.  Patient  states he is nauseated with loss of appetite  but denies vomiting, says he is currently trying to eat some tomato soup. He is c/o abdominal pain at level 9/10 and has not been able to pick up oxycodone that was prescribed at hospital discharge. He says when the called his pharmacy, they told him they needed additional information from his provider. This CCM RN called [facility] and was informed that the provider that wrote the oxycodone Rx is not a recognized provider by patient's insurance company and therefore another Rx will need to be submitted.  The [facility] is closed today so paged, then messaged resident on call,  Dr Eileen Stanford.  Advised patient that if oxycodone does not sufficiently relieve his pain, or his pain increases, or is unable to keep fluids down,  then either call the clinic to contact the resident on call or report to a local  emergency room or urgent care.  6:31 pm- Called patient and advised him that this CCM RN has not received a response from resident on call. Advised patient to call the clinic number an/or the [facility] hospital operator to reach the resident on call or go to he emergency room or Urgent care to have his pain addressed. Patient voiced  understaning and appreciation for this  RN CCM's attempts to reach the resident on call.   Follow up plan: The care management team will reach out to the patient again over the next 30-60 days.   Kelli Churn RN, CCM, [facility] RN Care Manager [phone]

## 2019-09-07 ENCOUNTER — Emergency Department (HOSPITAL_COMMUNITY): Payer: Medicare Other

## 2019-09-07 ENCOUNTER — Encounter (HOSPITAL_COMMUNITY): Payer: Self-pay | Admitting: Emergency Medicine

## 2019-09-07 ENCOUNTER — Inpatient Hospital Stay (HOSPITAL_COMMUNITY)
Admission: EM | Admit: 2019-09-07 | Discharge: 2019-09-14 | DRG: 291 | Disposition: A | Discharge status: Home / Self Care | Payer: Medicare Other | Attending: Internal Medicine | Admitting: Internal Medicine

## 2019-09-07 DIAGNOSIS — N400 Enlarged prostate without lower urinary tract symptoms: Secondary | ICD-10-CM | POA: Diagnosis present

## 2019-09-07 DIAGNOSIS — I130 Hypertensive heart and chronic kidney disease with heart failure and stage 1 through stage 4 chronic kidney disease, or unspecified chronic kidney disease: Secondary | ICD-10-CM | POA: Diagnosis present

## 2019-09-07 DIAGNOSIS — Z20822 Contact with and (suspected) exposure to covid-19: Secondary | ICD-10-CM | POA: Diagnosis present

## 2019-09-07 DIAGNOSIS — K5900 Constipation, unspecified: Secondary | ICD-10-CM | POA: Diagnosis present

## 2019-09-07 DIAGNOSIS — K30 Functional dyspepsia: Secondary | ICD-10-CM | POA: Diagnosis present

## 2019-09-07 DIAGNOSIS — E1122 Type 2 diabetes mellitus with diabetic chronic kidney disease: Secondary | ICD-10-CM | POA: Diagnosis present

## 2019-09-07 DIAGNOSIS — E1165 Type 2 diabetes mellitus with hyperglycemia: Secondary | ICD-10-CM | POA: Diagnosis present

## 2019-09-07 DIAGNOSIS — J9601 Acute respiratory failure with hypoxia: Secondary | ICD-10-CM | POA: Diagnosis present

## 2019-09-07 DIAGNOSIS — Z86711 Personal history of pulmonary embolism: Secondary | ICD-10-CM | POA: Diagnosis not present

## 2019-09-07 DIAGNOSIS — Z794 Long term (current) use of insulin: Secondary | ICD-10-CM | POA: Diagnosis not present

## 2019-09-07 DIAGNOSIS — R140 Abdominal distension (gaseous): Secondary | ICD-10-CM | POA: Diagnosis not present

## 2019-09-07 DIAGNOSIS — R109 Unspecified abdominal pain: Secondary | ICD-10-CM | POA: Diagnosis not present

## 2019-09-07 DIAGNOSIS — Z888 Allergy status to other drugs, medicaments and biological substances status: Secondary | ICD-10-CM

## 2019-09-07 DIAGNOSIS — I5033 Acute on chronic diastolic (congestive) heart failure: Secondary | ICD-10-CM | POA: Diagnosis present

## 2019-09-07 DIAGNOSIS — N179 Acute kidney failure, unspecified: Secondary | ICD-10-CM | POA: Diagnosis present

## 2019-09-07 DIAGNOSIS — Z89611 Acquired absence of right leg above knee: Secondary | ICD-10-CM | POA: Diagnosis not present

## 2019-09-07 DIAGNOSIS — M7989 Other specified soft tissue disorders: Secondary | ICD-10-CM | POA: Diagnosis present

## 2019-09-07 DIAGNOSIS — G4733 Obstructive sleep apnea (adult) (pediatric): Secondary | ICD-10-CM | POA: Diagnosis present

## 2019-09-07 DIAGNOSIS — M79609 Pain in unspecified limb: Secondary | ICD-10-CM | POA: Diagnosis not present

## 2019-09-07 DIAGNOSIS — E119 Type 2 diabetes mellitus without complications: Secondary | ICD-10-CM | POA: Diagnosis not present

## 2019-09-07 DIAGNOSIS — I5031 Acute diastolic (congestive) heart failure: Secondary | ICD-10-CM | POA: Diagnosis present

## 2019-09-07 DIAGNOSIS — Z86718 Personal history of other venous thrombosis and embolism: Secondary | ICD-10-CM | POA: Diagnosis not present

## 2019-09-07 DIAGNOSIS — Z7901 Long term (current) use of anticoagulants: Secondary | ICD-10-CM | POA: Diagnosis not present

## 2019-09-07 DIAGNOSIS — J189 Pneumonia, unspecified organism: Secondary | ICD-10-CM | POA: Diagnosis not present

## 2019-09-07 DIAGNOSIS — Z8249 Family history of ischemic heart disease and other diseases of the circulatory system: Secondary | ICD-10-CM | POA: Diagnosis not present

## 2019-09-07 DIAGNOSIS — N184 Chronic kidney disease, stage 4 (severe): Secondary | ICD-10-CM | POA: Diagnosis present

## 2019-09-07 DIAGNOSIS — Z6841 Body Mass Index (BMI) 40.0 and over, adult: Secondary | ICD-10-CM

## 2019-09-07 DIAGNOSIS — F4321 Adjustment disorder with depressed mood: Secondary | ICD-10-CM | POA: Diagnosis not present

## 2019-09-07 DIAGNOSIS — Z79899 Other long term (current) drug therapy: Secondary | ICD-10-CM

## 2019-09-07 DIAGNOSIS — R1084 Generalized abdominal pain: Secondary | ICD-10-CM

## 2019-09-07 DIAGNOSIS — K8020 Calculus of gallbladder without cholecystitis without obstruction: Secondary | ICD-10-CM | POA: Diagnosis present

## 2019-09-07 DIAGNOSIS — R112 Nausea with vomiting, unspecified: Secondary | ICD-10-CM | POA: Diagnosis not present

## 2019-09-07 DIAGNOSIS — Z833 Family history of diabetes mellitus: Secondary | ICD-10-CM | POA: Diagnosis not present

## 2019-09-07 DIAGNOSIS — E785 Hyperlipidemia, unspecified: Secondary | ICD-10-CM | POA: Diagnosis present

## 2019-09-07 DIAGNOSIS — E1143 Type 2 diabetes mellitus with diabetic autonomic (poly)neuropathy: Secondary | ICD-10-CM | POA: Diagnosis present

## 2019-09-07 DIAGNOSIS — K219 Gastro-esophageal reflux disease without esophagitis: Secondary | ICD-10-CM | POA: Diagnosis present

## 2019-09-07 DIAGNOSIS — K3184 Gastroparesis: Secondary | ICD-10-CM | POA: Diagnosis not present

## 2019-09-07 DIAGNOSIS — N185 Chronic kidney disease, stage 5: Secondary | ICD-10-CM | POA: Diagnosis present

## 2019-09-07 DIAGNOSIS — I10 Essential (primary) hypertension: Secondary | ICD-10-CM | POA: Diagnosis present

## 2019-09-07 DIAGNOSIS — N189 Chronic kidney disease, unspecified: Secondary | ICD-10-CM | POA: Diagnosis present

## 2019-09-07 LAB — URINALYSIS, ROUTINE W REFLEX MICROSCOPIC
Bacteria, UA: NONE SEEN
Bilirubin Urine: NEGATIVE
Glucose, UA: 50 mg/dL — AB
Hgb urine dipstick: NEGATIVE
Ketones, ur: NEGATIVE mg/dL
Leukocytes,Ua: NEGATIVE
Nitrite: NEGATIVE
Protein, ur: 100 mg/dL — AB
Specific Gravity, Urine: 1.01 (ref 1.005–1.030)
pH: 5 (ref 5.0–8.0)

## 2019-09-07 LAB — CBC
HCT: 27 % — ABNORMAL LOW (ref 39.0–52.0)
Hemoglobin: 8.3 g/dL — ABNORMAL LOW (ref 13.0–17.0)
MCH: 27 pg (ref 26.0–34.0)
MCHC: 30.7 g/dL (ref 30.0–36.0)
MCV: 87.9 fL (ref 80.0–100.0)
Platelets: 346 10*3/uL (ref 150–400)
RBC: 3.07 MIL/uL — ABNORMAL LOW (ref 4.22–5.81)
RDW: 14.6 % (ref 11.5–15.5)
WBC: 11.5 10*3/uL — ABNORMAL HIGH (ref 4.0–10.5)
nRBC: 0 % (ref 0.0–0.2)

## 2019-09-07 LAB — I-STAT VENOUS BLOOD GAS, ED
Acid-base deficit: 3 mmol/L — ABNORMAL HIGH (ref 0.0–2.0)
Bicarbonate: 20.7 mmol/L (ref 20.0–28.0)
Calcium, Ion: 1.06 mmol/L — ABNORMAL LOW (ref 1.15–1.40)
HCT: 28 % — ABNORMAL LOW (ref 39.0–52.0)
Hemoglobin: 9.5 g/dL — ABNORMAL LOW (ref 13.0–17.0)
O2 Saturation: 88 %
Potassium: 3.8 mmol/L (ref 3.5–5.1)
Sodium: 131 mmol/L — ABNORMAL LOW (ref 135–145)
TCO2: 22 mmol/L (ref 22–32)
pCO2, Ven: 29.6 mmHg — ABNORMAL LOW (ref 44.0–60.0)
pH, Ven: 7.451 — ABNORMAL HIGH (ref 7.250–7.430)
pO2, Ven: 51 mmHg — ABNORMAL HIGH (ref 32.0–45.0)

## 2019-09-07 LAB — COMPREHENSIVE METABOLIC PANEL
ALT: 29 U/L (ref 0–44)
AST: 17 U/L (ref 15–41)
Albumin: 2.9 g/dL — ABNORMAL LOW (ref 3.5–5.0)
Alkaline Phosphatase: 121 U/L (ref 38–126)
Anion gap: 15 (ref 5–15)
BUN: 137 mg/dL — ABNORMAL HIGH (ref 6–20)
CO2: 18 mmol/L — ABNORMAL LOW (ref 22–32)
Calcium: 8.4 mg/dL — ABNORMAL LOW (ref 8.9–10.3)
Chloride: 94 mmol/L — ABNORMAL LOW (ref 98–111)
Creatinine, Ser: 5.39 mg/dL — ABNORMAL HIGH (ref 0.61–1.24)
GFR calc Af Amer: 14 mL/min — ABNORMAL LOW (ref >60)
GFR calc non Af Amer: 12 mL/min — ABNORMAL LOW (ref >60)
Glucose, Bld: 480 mg/dL — ABNORMAL HIGH (ref 70–99)
Potassium: 3.7 mmol/L (ref 3.5–5.1)
Sodium: 127 mmol/L — ABNORMAL LOW (ref 135–145)
Total Bilirubin: 0.7 mg/dL (ref 0.3–1.2)
Total Protein: 7.3 g/dL (ref 6.5–8.1)

## 2019-09-07 LAB — LACTIC ACID, PLASMA
Lactic Acid, Venous: 0.8 mmol/L (ref 0.5–1.9)
Lactic Acid, Venous: 0.8 mmol/L (ref 0.5–1.9)

## 2019-09-07 LAB — CBG MONITORING, ED
Glucose-Capillary: 310 mg/dL — ABNORMAL HIGH (ref 70–99)
Glucose-Capillary: 286 mg/dL — ABNORMAL HIGH (ref 70–99)
Glucose-Capillary: 295 mg/dL — ABNORMAL HIGH (ref 70–99)

## 2019-09-07 LAB — GLUCOSE, CAPILLARY
Glucose-Capillary: 264 mg/dL — ABNORMAL HIGH (ref 70–99)
Glucose-Capillary: 218 mg/dL — ABNORMAL HIGH (ref 70–99)

## 2019-09-07 LAB — SARS CORONAVIRUS 2 BY RT PCR (HOSPITAL ORDER, PERFORMED IN ~~LOC~~ HOSPITAL LAB): SARS Coronavirus 2: NEGATIVE

## 2019-09-07 LAB — LIPASE, BLOOD: Lipase: 72 U/L — ABNORMAL HIGH (ref 11–51)

## 2019-09-07 LAB — BRAIN NATRIURETIC PEPTIDE: B Natriuretic Peptide: 493 pg/mL — ABNORMAL HIGH (ref 0.0–100.0)

## 2019-09-07 LAB — BETA-HYDROXYBUTYRIC ACID: Beta-Hydroxybutyric Acid: 0.08 mmol/L (ref 0.05–0.27)

## 2019-09-07 LAB — PROCALCITONIN: Procalcitonin: 0.98 ng/mL

## 2019-09-07 MED ORDER — INSULIN ASPART 100 UNIT/ML ~~LOC~~ SOLN
10.0000 [IU] | Freq: Three times a day (TID) | SUBCUTANEOUS | Status: DC
  Administered 2019-09-08 – 2019-09-10 (×5): 10 [IU] via SUBCUTANEOUS

## 2019-09-07 MED ORDER — PANTOPRAZOLE SODIUM 40 MG PO TBEC
40.0000 mg | DELAYED_RELEASE_TABLET | Freq: Every day | ORAL | Status: DC
  Administered 2019-09-07 – 2019-09-11 (×4): 40 mg via ORAL
  Filled 2019-09-07 (×4): qty 1

## 2019-09-07 MED ORDER — SENNOSIDES-DOCUSATE SODIUM 8.6-50 MG PO TABS
1.0000 | ORAL_TABLET | Freq: Two times a day (BID) | ORAL | Status: DC
  Administered 2019-09-07: 1 via ORAL
  Filled 2019-09-07: qty 1

## 2019-09-07 MED ORDER — OXYBUTYNIN CHLORIDE 5 MG PO TABS
25.0000 mg | ORAL_TABLET | Freq: Three times a day (TID) | ORAL | Status: DC
  Administered 2019-09-07 – 2019-09-09 (×6): 25 mg via ORAL
  Filled 2019-09-07 (×11): qty 5

## 2019-09-07 MED ORDER — ONDANSETRON HCL 4 MG/2ML IJ SOLN
4.0000 mg | Freq: Four times a day (QID) | INTRAMUSCULAR | Status: DC | PRN
  Administered 2019-09-08 – 2019-09-11 (×8): 4 mg via INTRAVENOUS
  Filled 2019-09-07 (×8): qty 2

## 2019-09-07 MED ORDER — METOLAZONE 5 MG PO TABS
2.5000 mg | ORAL_TABLET | Freq: Every day | ORAL | Status: DC
  Administered 2019-09-07 – 2019-09-12 (×5): 2.5 mg via ORAL
  Filled 2019-09-07 (×6): qty 1

## 2019-09-07 MED ORDER — PRAVASTATIN SODIUM 10 MG PO TABS
20.0000 mg | ORAL_TABLET | Freq: Every day | ORAL | Status: DC
  Administered 2019-09-07 – 2019-09-13 (×6): 20 mg via ORAL
  Filled 2019-09-07 (×6): qty 2

## 2019-09-07 MED ORDER — HYDRALAZINE HCL 50 MG PO TABS
50.0000 mg | ORAL_TABLET | Freq: Four times a day (QID) | ORAL | Status: DC
  Administered 2019-09-07 – 2019-09-14 (×22): 50 mg via ORAL
  Filled 2019-09-07 (×22): qty 1
  Filled 2019-09-07: qty 2

## 2019-09-07 MED ORDER — SODIUM CHLORIDE 0.9% FLUSH
3.0000 mL | Freq: Two times a day (BID) | INTRAVENOUS | Status: DC
  Administered 2019-09-07 – 2019-09-14 (×14): 3 mL via INTRAVENOUS

## 2019-09-07 MED ORDER — SODIUM CHLORIDE 0.9% FLUSH: 3.0000 mL | INTRAVENOUS | Status: DC | PRN

## 2019-09-07 MED ORDER — MORPHINE SULFATE (PF) 4 MG/ML IV SOLN
4.0000 mg | Freq: Once | INTRAVENOUS | Status: AC
  Administered 2019-09-07: 4 mg via INTRAVENOUS
  Filled 2019-09-07: qty 1

## 2019-09-07 MED ORDER — SODIUM CHLORIDE 0.9 % IV SOLN: 250.0000 mL | INTRAVENOUS | Status: DC | PRN

## 2019-09-07 MED ORDER — POLYETHYLENE GLYCOL 3350 17 G PO PACK
17.0000 g | PACK | Freq: Two times a day (BID) | ORAL | Status: DC
  Administered 2019-09-07 – 2019-09-08 (×2): 17 g via ORAL
  Filled 2019-09-07 (×4): qty 1

## 2019-09-07 MED ORDER — DOXYCYCLINE HYCLATE 100 MG PO TABS
100.0000 mg | ORAL_TABLET | Freq: Once | ORAL | Status: AC
  Administered 2019-09-07: 100 mg via ORAL
  Filled 2019-09-07: qty 1

## 2019-09-07 MED ORDER — CLONIDINE HCL 0.2 MG PO TABS
0.3000 mg | ORAL_TABLET | Freq: Two times a day (BID) | ORAL | Status: DC
  Administered 2019-09-07 – 2019-09-14 (×11): 0.3 mg via ORAL
  Filled 2019-09-07 (×12): qty 1

## 2019-09-07 MED ORDER — ACETAMINOPHEN 325 MG PO TABS
650.0000 mg | ORAL_TABLET | ORAL | Status: DC | PRN
  Administered 2019-09-07 – 2019-09-11 (×3): 650 mg via ORAL
  Filled 2019-09-07 (×4): qty 2

## 2019-09-07 MED ORDER — APIXABAN 5 MG PO TABS
5.0000 mg | ORAL_TABLET | Freq: Two times a day (BID) | ORAL | Status: DC
  Administered 2019-09-07 – 2019-09-14 (×11): 5 mg via ORAL
  Filled 2019-09-07 (×12): qty 1

## 2019-09-07 MED ORDER — INSULIN ASPART 100 UNIT/ML ~~LOC~~ SOLN
20.0000 [IU] | Freq: Once | SUBCUTANEOUS | Status: AC
  Administered 2019-09-07: 20 [IU] via SUBCUTANEOUS

## 2019-09-07 MED ORDER — INSULIN ASPART 100 UNIT/ML ~~LOC~~ SOLN
0.0000 [IU] | Freq: Three times a day (TID) | SUBCUTANEOUS | Status: DC
  Administered 2019-09-07: 5 [IU] via SUBCUTANEOUS
  Administered 2019-09-08: 2 [IU] via SUBCUTANEOUS
  Administered 2019-09-08: 3 [IU] via SUBCUTANEOUS
  Administered 2019-09-08: 5 [IU] via SUBCUTANEOUS
  Administered 2019-09-09: 2 [IU] via SUBCUTANEOUS
  Administered 2019-09-09 – 2019-09-10 (×3): 5 [IU] via SUBCUTANEOUS
  Administered 2019-09-10: 9 [IU] via SUBCUTANEOUS
  Administered 2019-09-10 – 2019-09-12 (×7): 3 [IU] via SUBCUTANEOUS
  Administered 2019-09-13: 5 [IU] via SUBCUTANEOUS
  Administered 2019-09-13: 2 [IU] via SUBCUTANEOUS
  Administered 2019-09-13 – 2019-09-14 (×2): 3 [IU] via SUBCUTANEOUS

## 2019-09-07 MED ORDER — TAMSULOSIN HCL 0.4 MG PO CAPS
0.4000 mg | ORAL_CAPSULE | Freq: Every day | ORAL | Status: DC
  Administered 2019-09-07 – 2019-09-13 (×5): 0.4 mg via ORAL
  Filled 2019-09-07 (×6): qty 1

## 2019-09-07 MED ORDER — FUROSEMIDE 10 MG/ML IJ SOLN
80.0000 mg | Freq: Once | INTRAMUSCULAR | Status: AC
  Administered 2019-09-07: 80 mg via INTRAVENOUS
  Filled 2019-09-07: qty 8

## 2019-09-07 MED ORDER — VITAMIN B-12 1000 MCG PO TABS
500.0000 ug | ORAL_TABLET | Freq: Every day | ORAL | Status: DC
  Administered 2019-09-08 – 2019-09-14 (×6): 500 ug via ORAL
  Filled 2019-09-07 (×9): qty 1

## 2019-09-07 MED ORDER — FUROSEMIDE 10 MG/ML IJ SOLN
120.0000 mg | Freq: Two times a day (BID) | INTRAVENOUS | Status: DC
  Administered 2019-09-07 – 2019-09-12 (×10): 120 mg via INTRAVENOUS
  Filled 2019-09-07 (×4): qty 12
  Filled 2019-09-07: qty 10
  Filled 2019-09-07: qty 12
  Filled 2019-09-07 (×5): qty 10
  Filled 2019-09-07 (×2): qty 12

## 2019-09-07 MED ORDER — FUROSEMIDE 10 MG/ML IJ SOLN: 120.0000 mg | Freq: Two times a day (BID) | INTRAVENOUS | Status: DC

## 2019-09-07 MED ORDER — SODIUM CHLORIDE 0.9 % IV SOLN
1.0000 g | Freq: Once | INTRAVENOUS | Status: AC
  Administered 2019-09-07: 1 g via INTRAVENOUS
  Filled 2019-09-07: qty 10

## 2019-09-07 NOTE — ED Triage Notes (Signed)
Pt's last BM was last Monday.

## 2019-09-07 NOTE — ED Notes (Signed)
Pt placed on 2L O2 [place]. O2 saturation kept dropping into the 80s when pt fell asleep. RN, Luellen Pucker, notified.

## 2019-09-07 NOTE — ED Triage Notes (Signed)
Pt. Stated, [name] not had an appetite along with some stomach pain and SOB since Wednesday.

## 2019-09-07 NOTE — ED Notes (Signed)
Dinner tray delivered.

## 2019-09-07 NOTE — Progress Notes (Signed)
VASCULAR LAB   Left lower extremity venous duplex completed.    Preliminary report:  See CV proc for preliminary results.  Called report to Dr. Ralene Bathe.  Amore Ackman, RVT 09/07/2019, 2:22 PM

## 2019-09-07 NOTE — ED Notes (Signed)
Lunch tray delivered.

## 2019-09-07 NOTE — H&P (Addendum)
Date: 09/07/2019               Patient Name:  [name] MRN: [id number]  DOB: 04/04/1975 Age / Sex: 44 y.o., male   PCP: [name], MD         Medical Service: Internal Medicine Teaching Service         Attending Physician: Dr. Quintella Reichert, MD    First Contact: Dr. [name] Pager: [phone]  Second Contact: Dr. Modena Nunnery Pager: [phone]       After Hours (After 5p/  First Contact Pager: [phone]  weekends / holidays): Second Contact Pager: [phone]   Chief Complaint: Abdominal pain  History of Present Illness:  Mr.Mealing is a 44 yo M w/ PMH of CKD4, HFpEF, T2DM, HTN, DVT/PE on chronic Ac, HLD presenting to [facility] w/ abdominal pain. He was in his usual state of health until day after discharge from the hospital. He mentions that he was able to pick-up most of his discharge medication except for the pain medication due to authorization issue and since then has had progressive worsening abdominal pain associated with nausea, NBNB vomiting, poor oral intake and constipation. He mentions the pain feels similar to his prior presentation and feels severity has progressively worsened and now feels even more sick than his prior admission. He states during this course, he was able to take his home medications as prescribed but mentions having decreased oral intake. He denies any fevers, chills, diarrhea. He mentions he also noticed worsening swelling and dyspnea.  In the ED, he was found to have hyperglycemia with cbg of 400s without acidosis. His chest X-ray and CT abd/pelvis was significant bilateral opacities concerning for pneumonia vs pulmonary edema. He was given dose of doxycycline and ceftriaxone and IMTS was consulted for admission.  Meds:  No outpatient medications have been marked as taking for the 09/07/19 encounter [facility] Encounter).   Allergies: Allergies as of 09/07/2019 - Review Complete 09/07/2019  Allergen Reaction Noted  . Reglan [metoclopramide]  Other (See Comments) 11/01/2016  . Metformin and related Diarrhea 08/19/2019  . Ozempic (0.25 or 0.5 mg-dose) [semaglutide(0.25 or 0.5mg -dos)] Nausea And Vomiting 08/19/2019   Past Medical History:  Diagnosis Date  . Acute venous embolism and thrombosis of deep vessels of proximal lower extremity ([place]) 07/19/2011  . Anemia   . Anginal pain ([place])    pt denies  . Anxiety   . Chest pain, neg MI, normal coronaries by cath 02/18/2013   pt denies  . CHF (congestive heart failure) ([place])   . CKD (chronic kidney disease) stage 3, GFR 30-59 ml/min 02/19/2013  . Colon polyps   . Depression   . Diabetic ulcer of right foot ([place])   . DVT (deep venous thrombosis) ([place]) 09/2002   patient reports additional DVTs in '06 & '11 (unconfirmed)  . ED (erectile dysfunction)   . GERD (gastroesophageal reflux disease)   . History of blood transfusion    "related to OR" (10/31/2016)  . Hyperlipidemia 02/19/2013  . Hypertension   . Nausea & vomiting    "constant for the last couple weeks" (10/31/2016)  . Nephrotic syndrome   . Obesity    BMI 44, weight 346 pounds 01/30/14  . OSA (obstructive sleep apnea) 02/28/2018   Mild obstructive sleep apnea with an AHI of 9.8/h but severe during REM sleep with an AHI of 33.8/h.  Oxygen saturations dropped to 86% and there was moderate snoring  . Peripheral edema   . Pneumonia   .  Prosthesis adjustment 08/17/2016  . Pulmonary embolism ([place]) 09/2002   treated with 6 months of warfarin  . Pyelonephritis 02/02/2018  . Type I diabetes mellitus ([place]) dx'd 2001  . Urinary retention    Family History:  Mother had heart disease and diabetes  Social History:  Lives alone in [place] and had home health aid that visits and helps with his IADLs. Denies tobacco, illicit substance use. Occasional drinks.  Review of Systems: A complete ROS was negative except as per HPI.   Physical Exam: Blood pressure (!) 156/76, pulse 84, temperature 98.3 F (36.8 C), temperature source  Oral, resp. rate 20, SpO2 90 %.  Gen: Well-developed, well nourished, in distress HEENT: NCAT head, hearing intact, EOMI, MMM Neck: supple, ROM intact, + JVD CV: RRR, S1, S2 normal Pulm: Bilateral rales, increased respiratory effort with accessory muscle use  Abd: Tense, Distended, + abdominal wall edema, diffuse tenderness to palpation, most severe in epigastric region Extm: ROM intact, Peripheral pulses intact, Stable R AKA, LLE w/ 2+ pitting edema up to thighs Skin: Dry, Warm, normal turgor  Neuro: AAOx3  EKG: personally reviewed my interpretation is normal sinus, no ischemic changes  CXR: personally reviewed my interpretation is poor inspiratory effort, bilateral pulmonary edema and increased vascular congestion, no focal opacities.  Assessment & Plan by Problem: Active Problems:   * No active hospital problems. *  Mr.Lamke is a 44 yo M w/ PMH of CKD4, HFpEF, T2DM, HTN, DVT/PE on chronic Ac, HLD admit due to acute on chronic diastolic heart failure / stage 4 renal failure  Acute hypoxic respiratory failure 2/2 Acute on chronic diastolic heart failure & ?Pneumonia Mr.Elenbaas presents w/ evidence of significant hypervolemia with worsening work of breathing and increased pulmonary edema noted on imaging with BNP of 493. Currently on 2L [place] at high risk for decompensation. Anasarca on exam. Mentions adherence to home med but considering recurrent abdominal pain with nausea and vomiting, may have missed doses. IV diuresis started in ED but suspect will need higher doses due to worsening renal fx. Also given dose of doxycycline and ceftriaxone for possible pneumonia. Unable to r/o but not having significant productive sputum at the moment. Last Echo 05/2019 w/ EF of 60-65% with indeterminate diastolic parameters. Last known weight at 167kg. - Start IV Furosemide 120mg  BID - Trend K, Mag level - Strict I&Os - Daily Weights - Fluid restriction - Keep O2 sat >88 - Replenish K as needed >4.0 -  C/w home meds: metolazone 2.5mg  daily - F/u pro-calcitonin  Acute on chronic stage 4 kidney disease Follows with [facility]. Had continued progression of renal disease from stage 3 couple months ago to stage 4, now approaching stage 5. Likely in AKi due to hypervolemia but could be progression of his CKD. At discharge on prior admission BUN 116, Creatinine 4.67. On this admission, BUN 137, Creatinine 5.39. Hopefully will improve with diuresis. K 3.7  - Diuresis as above - Trend renal fx - Avoid nephrotoxic meds when able - Consider non-urgent consult to nephrology  Abdominal pain, nausea, vomiting 2/2 bowel edema and/or GERD/PUD Present w/ continued abdominal pain, nausea. CT abd/pelvis without obvious pancreatic/biliary/intestinal/colon pathology. Anasarca on exam suggesting bowel edema as likely cause. Chart review mentions lower abdominal pain but on my exam, endorsing mostly epigastric pain which could also be due to GERD or PUD. May benefit from EGD for further work-up at some point but currently not stable for EGD. Also has quite large medication list and polypharmacy may be  playing a part. - Start pantoprazole 40mg  daily - Zofran prn for nausea - C/w previous bowel regimen - Diuresis as above  Recurrent Cystitis Follows w/ [facility] for hx of recurrent UTIs. Takes prophylactic Bactrim but renal fx worsening. Received 10 days of cephalosporin therapy. No UA at the moment but not endorsing urinary symptoms. Low suspicion for current infection. - Hold home Bactrim in setting of AKI - F/u UA - Monitor for sxs  Hyperglycemia T2DM On novolog 25 units tid, victoza 1.2mg  daily, Tresiba 70 units qhs. Chart review shows episodes of hypoglycemia during prior admission. Currently blood glucose at 486. ED initially concerned about DKA but ruled out w/ pH of 7.451 and beta-hydroxybutyrate negative. Will need to resume home regimen with caution in setting of worsening renal function. -  Novolog 10 units now - Scheduled novolog 10 units TID with meals - Hold long-acting in setting of poor oral intake, worsening renal fx - SSI - Glucose checks  HTN On carvedilol, hydralazine, clonidine at home. Current bp 156/76 - C/w home meds  Hx of DVT/PE - C/w home meds: eliquis 5mg  BID  BPH - C/w home meds: oxybutynin 25mg  TID, tamsulosin 0.4mg  qhs  DVT prophx: eliquis Diet: Renal/CM Bowel: Miralax Code: Full  Prior to Admission Living Arrangement: Home Anticipated Discharge Location: Home Barriers to Discharge: medical treatment  Dispo: Admit patient to Inpatient with expected length of stay greater than 2 midnights.  Signed: Mosetta Anis, MD 09/07/2019, 4:55 PM Pager: [phone] After 5pm on weekdays and 1pm on weekends: On Call Pager: [phone]

## 2019-09-07 NOTE — ED Provider Notes (Signed)
[facility] EMERGENCY DEPARTMENT Provider Note   CSN: [id number] Arrival date & time: 09/07/19  7782     History Chief Complaint  Patient presents with  . Abdominal Pain  . Shortness of Breath  . Leg Swelling    [name] is a 44 y.o. male.  The history is provided by the patient and medical records. No language interpreter was used.  Abdominal Pain Associated symptoms: shortness of breath   Shortness of Breath Associated symptoms: abdominal pain    [name] is a 44 y.o. male who presents to the Emergency Department complaining of abdominal pain, sob. He has a history of diabetes, CKD. He presents the emergency department for evaluation of abdominal pain and shortness of breath. He was recently admitted to the hospital on August 20 and discharged home on August 24 for abdominal pain, constipation and cystitis. He states he was feeling better at the time of hospital discharge and then the following day developed recurrent abdominal pain. He also complains of poor appetite. He can only tolerate super water. He has slight cough. He states that he has increased shortness of breath, which is worse with laying supine. He also complains of left lower extremity edema with pain in his leg. He states that he cannot function due to the pain and swelling in his leg. He also states that at the time of hospital discharge she was unable to fill his pain medications due to prescribing problems. His abdominal pain is described as generalized and constant nature. Pain is worse with movement.  Has not been vaccinated for covid 19.  No known sick contacts.      Past Medical History:  Diagnosis Date  . Acute venous embolism and thrombosis of deep vessels of proximal lower extremity ([place]) 07/19/2011  . Anemia   . Anginal pain ([place])    pt denies  . Anxiety   . Chest pain, neg MI, normal coronaries by cath 02/18/2013   pt denies  . CHF (congestive heart failure) ([place])   . CKD  (chronic kidney disease) stage 3, GFR 30-59 ml/min 02/19/2013  . Colon polyps   . Depression   . Diabetic ulcer of right foot ([place])   . DVT (deep venous thrombosis) ([place]) 09/2002   patient reports additional DVTs in '06 & '11 (unconfirmed)  . ED (erectile dysfunction)   . GERD (gastroesophageal reflux disease)   . History of blood transfusion    "related to OR" (10/31/2016)  . Hyperlipidemia 02/19/2013  . Hypertension   . Nausea & vomiting    "constant for the last couple weeks" (10/31/2016)  . Nephrotic syndrome   . Obesity    BMI 44, weight 346 pounds 01/30/14  . OSA (obstructive sleep apnea) 02/28/2018   Mild obstructive sleep apnea with an AHI of 9.8/h but severe during REM sleep with an AHI of 33.8/h.  Oxygen saturations dropped to 86% and there was moderate snoring  . Peripheral edema   . Pneumonia   . Prosthesis adjustment 08/17/2016  . Pulmonary embolism ([place]) 09/2002   treated with 6 months of warfarin  . Pyelonephritis 02/02/2018  . Type I diabetes mellitus ([place]) dx'd 2001  . Urinary retention     Patient Active Problem List   Diagnosis Date Noted  . Abdominal pain 08/29/2019  . AKI (acute kidney injury) ([place]) 08/29/2019  . Anemia associated with chronic renal failure   . CKD (chronic kidney disease)   . Peripheral edema   . Hypervolemia associated with  renal insufficiency 07/30/2019  . Acute renal failure with acute tubular necrosis superimposed on stage 4 chronic kidney disease ([place])   . Multifocal pneumonia   . Acute pain of left knee 01/01/2019  . Hemoglobinuria 12/12/2018  . Hematoma of left lower leg 12/12/2018  . Urinary tract infection 09/04/2018  . Exposure to SARS-associated coronavirus 07/25/2018  . Headache 06/18/2018  . Leg swelling   . Cellulitis 04/12/2018  . Pain of left lower extremity 03/20/2018  . OSA (obstructive sleep apnea) 02/28/2018  . Chronic diastolic heart failure ([place]) 02/02/2018  . Healthcare maintenance 08/15/2017  . Erectile  dysfunction 04/22/2017  . History of pulmonary embolus (PE) 06/09/2016  . Anxiety 05/04/2016  . Depression 05/04/2016  . Hypogonadism in male 05/04/2016  . S/P AKA (above knee amputation) unilateral, right ([place]) 05/04/2016  . Urinary retention 05/04/2016  . Anemia of chronic disease 05/04/2016  . Morbid obesity with BMI of 40.0-44.9, adult ([place]) 10/14/2015  . Dyslipidemia 08/12/2014  . Pedal edema 08/12/2014  . Uncontrolled type 2 diabetes mellitus with hyperglycemia, with long-term current use of insulin ([place]) 08/12/2014  . Unilateral AKA, right ([place]) 12/26/2013  . Hypoglycemic event in diabetes ([place]) 12/17/2013  . Hyperlipidemia 02/19/2013  . S/P cardiac cath, 02/18/13, normal coronaries 02/19/2013  . Chronic renal insufficiency, stage 3 (moderate) 02/19/2013  . Hypertension 07/24/2011  . Long term current use of anticoagulant therapy 07/19/2011  . History of DVT (deep vein thrombosis) 07/28/2009  . Morbid obesity ([place]) 10/26/2005  . Insulin dependent type 2 diabetes mellitus, controlled ([place]) 01/09/2002    Past Surgical History:  Procedure Laterality Date  . AMPUTATION Right 12/22/2013   Procedure: AMPUTATION BELOW KNEE;  Surgeon: [name], MD;  Location: [place];  Service: Orthopedics;  Laterality: Right;  . AMPUTATION Right 02/19/2014   Procedure: RIGHT ABOVE KNEE AMPUTATION ;  Surgeon: [name], MD;  Location: [place];  Service: Orthopedics;  Laterality: Right;  . blood clot removal  2016  . CARDIAC CATHETERIZATION  02/18/2013   normal coronaries  . ESOPHAGOGASTRODUODENOSCOPY N/A 11/02/2016   Procedure: ESOPHAGOGASTRODUODENOSCOPY (EGD);  Surgeon: [name], MD;  Location: [facility] ENDOSCOPY;  Service: Endoscopy;  Laterality: N/A;  . I & D EXTREMITY Right 12/14/2013   Procedure: IRRIGATION AND DEBRIDEMENT RIGHT FOOT;  Surgeon: [name], MD;  Location: [place];  Service: Orthopedics;  Laterality: Right;  . INCISION AND DRAINAGE ABSCESS  2007; 2015   "back"  . INCISION  AND DRAINAGE OF WOUND Right 12/22/2013   Procedure: I&D RIGHT BUTTOCK;  Surgeon: [name], MD;  Location: [place];  Service: Orthopedics;  Laterality: Right;  . LEFT HEART CATHETERIZATION WITH CORONARY ANGIOGRAM N/A 02/18/2013   Procedure: LEFT HEART CATHETERIZATION WITH CORONARY ANGIOGRAM;  Surgeon: [name], MD;  Location: [facility] CATH LAB;  Service: Cardiovascular;  Laterality: N/A;       Family History  Problem Relation Age of Onset  . Heart disease Mother   . Diabetes Mother   . Diabetes Maternal Uncle   . Diabetes Cousin     Social History   Tobacco Use  . Smoking status: Never Smoker  . Smokeless tobacco: Never Used  Vaping Use  . Vaping Use: Never used  Substance Use Topics  . Alcohol use: Yes    Comment: 10/31/2016 "a few beers q 6 months or so"  . Drug use: No    Home Medications Prior to Admission medications   Medication Sig Start Date End Date Taking? Authorizing Provider  Accu-Chek Softclix Lancets  lancets Check blood sugar up to 3 times a day Patient taking differently: 1 each by Other route See admin instructions. Check blood sugar up to 4-5 times a day 05/08/19   Velna Ochs, MD  acidophilus (RISAQUAD) CAPS capsule Take 1 capsule by mouth daily.    [provider]  allopurinol (ZYLOPRIM) 100 MG tablet Take 2 tablets (200 mg total) by mouth daily. 09/05/18   Aldine Contes, MD  Alpha-Lipoic Acid 600 MG CAPS Take 600 mg by mouth daily.    [provider]  amLODipine (NORVASC) 10 MG tablet Take 10 mg by mouth at bedtime.    [provider]  Blood Glucose Monitoring Suppl (ACCU-CHEK GUIDE ME) w/Device KIT Use to check blood sugar up to 3 times a day Patient taking differently: 1 each by Other route See admin instructions. Use to check blood sugar up to 4-5 times a day 05/06/19   [name], MD  calcitRIOL (ROCALTROL) 0.5 MCG capsule Take 2.5 mcg by mouth daily.  05/06/19   [provider]  carvedilol (COREG) 25 MG  tablet Take 1 tablet (25 mg total) by mouth 2 (two) times daily with a meal. 06/03/19 09/01/19  Chundi, Verne Spurr, MD  cloNIDine (CATAPRES) 0.3 MG tablet Take 1 tablet (0.3 mg total) by mouth 2 (two) times daily. 06/26/19   [name], MD  Cyanocobalamin (B-12) 500 MCG SUBL Place 500 mcg under the tongue daily.     [provider]  ELIQUIS 5 MG TABS tablet Take 1 tablet by mouth twice daily Patient taking differently: Take 5 mg by mouth 2 (two) times daily.  08/22/19   Virl Axe, MD  famotidine (PEPCID) 20 MG tablet Take 1 tablet (20 mg total) by mouth 2 (two) times daily. Patient taking differently: Take 20 mg by mouth 2 (two) times daily as needed for heartburn or indigestion.  05/07/18   Hedges, Dellis Filbert, PA-C  furosemide (LASIX) 80 MG tablet Take 1 tablet (80 mg total) by mouth 2 (two) times daily. Patient taking differently: Take 80-160 mg by mouth 2 (two) times daily. 153m in the morning and 861min the afternoon. 08/26/19 11/24/19  KrAsencion NobleMD  gabapentin (NEURONTIN) 100 MG capsule Take 1 capsule (100 mg total) by mouth 3 (three) times daily. 06/26/19   WiMaudie MercuryMD  glucose blood (ACCU-CHEK GUIDE) test strip Check blood sugar up to 3 times a day Patient taking differently: 1 each by Other route See admin instructions. Check blood sugar up to 4-5 times a day 05/08/19   GuVelna OchsMD  hydrALAZINE (APRESOLINE) 50 MG tablet Take 1 tablet by mouth 4 times daily Patient taking differently: Take 50 mg by mouth 4 (four) times daily.  07/15/19   CoMarianna PaymentMD  insulin aspart (NOVOLOG) 100 UNIT/ML FlexPen Inject 25 Units into the skin 3 (three) times daily with meals. 20 unit morning, 25 units lunch 30 units for dinner Patient taking differently: Inject 20-30 Units into the skin See admin instructions. 20 units in the morning, 25 units in the afternoon and 30 units before bedtime Note: Doesn't use on mornings when he works out. 10/04/18   LeMosetta AnisMD  Insulin  Pen Needle (B-D UF III MINI PEN NEEDLES) 31G X 5 MM MISC USE  4 TIMES DAILY Patient taking differently: 1 each by Other route See admin instructions. USE 4-5 TIMES DAILY 09/24/18   WiMaudie MercuryMD  Insulin Pen Needle (PEN NEEDLES 3/16") 31G X 5 MM MISC Use five times  daily Patient taking differently: 1 each by Other route See admin instructions. Use five times daily 08/14/16   [name], DO  Lancet Devices [facility]) lancets Use to check blood sugars 5 times a day. Dx code:E11.9. Insulin dependent. Patient taking differently: 1 each by Other route See admin instructions. Use to check blood sugars 5 times a day. Dx code:E11.9. Insulin dependent. 08/14/16   Kalman Shan Ratliff, DO  lovastatin (MEVACOR) 20 MG tablet Take 1 tablet by mouth once daily Patient taking differently: Take 20 mg by mouth at bedtime.  08/22/19   Virl Axe, MD  Melatonin 10 MG TABS Take 10 mg by mouth at bedtime as needed (sleep).    [provider]  metolazone (ZAROXOLYN) 2.5 MG tablet Take 2.5 mg by mouth every other day. 03/30/18   [provider]  Omega-3 Fatty Acids (FISH OIL) 1000 MG CAPS Take 1,000 mg by mouth at bedtime.     [provider]  ondansetron (ZOFRAN) 4 MG tablet Take 1 tablet (4 mg total) by mouth every 6 (six) hours as needed for nausea. 09/02/19   [place], Carley D, DO  oxybutynin (DITROPAN) 5 MG tablet Take 25 mg by mouth 3 (three) times daily.     [provider]  oxyCODONE 10 MG TABS Take 1 tablet (10 mg total) by mouth every 6 (six) hours as needed for up to 5 days for severe pain. 09/02/19 09/07/19  [place], Nila Nephew D, DO  polyethylene glycol (MIRALAX / GLYCOLAX) 17 g packet Take 17 g by mouth 2 (two) times daily. 09/02/19   [place], Carley D, DO  senna-docusate (SENOKOT-S) 8.6-50 MG tablet Take 2 tablets by mouth 2 (two) times daily. 09/02/19   [place], Carley D, DO  tamsulosin (FLOMAX) 0.4 MG CAPS capsule Take 0.4 mg by mouth at  bedtime.     [provider]  TRESIBA FLEXTOUCH 200 UNIT/ML FlexTouch Pen INJECT 70 UNITS SUBCUTANEOUSLY ONCE DAILY Patient taking differently: Inject 96 Units into the skin in the morning.  04/16/19   Agyei, Caprice Kluver, MD  VICTOZA 18 MG/3ML SOPN INJECT 1.2 MG (0.2 MLS) INTO THE SKIN ONCE DAILY AT THE SAME TIME. Patient taking differently: Inject 1.2 mg into the skin See admin instructions. INJECT 1.2 MG (0.2 MLS) INTO THE SKIN ONCE DAILY AT THE SAME TIME. 07/25/19   [name], MD    Allergies    Reglan [metoclopramide], Metformin and related, and Ozempic (0.25 or 0.5 mg-dose) [semaglutide(0.25 or 0.39m-dos)]  Review of Systems   Review of Systems  Respiratory: Positive for shortness of breath.   Gastrointestinal: Positive for abdominal pain.  All other systems reviewed and are negative.   Physical Exam Updated Vital Signs BP (!) 156/76 (BP Location: Left Arm)   Pulse 84   Temp 98.3 F (36.8 C) (Oral)   Resp 20   SpO2 90%   Physical Exam Vitals and nursing note reviewed.  Constitutional:      Appearance: He is well-developed.  HENT:     Head: Normocephalic and atraumatic.  Cardiovascular:     Rate and Rhythm: Normal rate and regular rhythm.     Heart sounds: No murmur heard.   Pulmonary:     Effort: Pulmonary effort is normal. No respiratory distress.     Comments: Decreased air movement in lung bases bilaterally Abdominal:     Palpations: Abdomen is soft.     Tenderness: There is no guarding or rebound.     Comments: Moderate upper abdominal tenderness  Musculoskeletal:        General: No tenderness.     Comments: 3-4+ pitting edema to LLE.  RLE AKA  Skin:    General: Skin is warm and dry.  Neurological:     Mental Status: He is alert and oriented to person, place, and time.  Psychiatric:        Behavior: Behavior normal.     ED Results / Procedures / Treatments   Labs (all labs ordered are listed, but only abnormal results are displayed) Labs Reviewed    LIPASE, BLOOD - Abnormal; Notable for the following components:      Result Value   Lipase 72 (*)    All other components within normal limits  COMPREHENSIVE METABOLIC PANEL - Abnormal; Notable for the following components:   Sodium 127 (*)    Chloride 94 (*)    CO2 18 (*)    Glucose, Bld 480 (*)    BUN 137 (*)    Creatinine, Ser 5.39 (*)    Calcium 8.4 (*)    Albumin 2.9 (*)    GFR calc non Af Amer 12 (*)    GFR calc Af Amer 14 (*)    All other components within normal limits  CBC - Abnormal; Notable for the following components:   WBC 11.5 (*)    RBC 3.07 (*)    Hemoglobin 8.3 (*)    HCT 27.0 (*)    All other components within normal limits  I-STAT VENOUS BLOOD GAS, ED - Abnormal; Notable for the following components:   pH, Ven 7.451 (*)    pCO2, Ven 29.6 (*)    pO2, Ven 51.0 (*)    Acid-base deficit 3.0 (*)    Sodium 131 (*)    Calcium, Ion 1.06 (*)    HCT 28.0 (*)    Hemoglobin 9.5 (*)    All other components within normal limits  SARS CORONAVIRUS 2 BY RT PCR (HOSPITAL ORDER, [facility])  CULTURE, BLOOD (ROUTINE X 2)  CULTURE, BLOOD (ROUTINE X 2)  URINALYSIS, ROUTINE W REFLEX MICROSCOPIC  BETA-HYDROXYBUTYRIC ACID  BRAIN NATRIURETIC PEPTIDE  LACTIC ACID, PLASMA  LACTIC ACID, PLASMA    EKG EKG Interpretation  Date/Time:  Sunday September 07 2019 08:49:32 EDT Ventricular Rate:  84 PR Interval:  192 QRS Duration: 90 QT Interval:  368 QTC Calculation: 434 R Axis:   -43 Text Interpretation: Normal sinus rhythm Left axis deviation Abnormal ECG Confirmed by Quintella Reichert [phone]) on 09/07/2019 12:35:07 PM   Radiology CT ABDOMEN PELVIS WO CONTRAST  Result Date: 09/07/2019 CLINICAL DATA:  Abdominal distension and pain. EXAM: CT ABDOMEN AND PELVIS WITHOUT CONTRAST TECHNIQUE: Multidetector CT imaging of the abdomen and pelvis was performed following the standard protocol without IV contrast. COMPARISON:  CT abdomen pelvis dated 08/29/2019.  FINDINGS: Lower chest: Moderate ground-glass opacities/consolidation are seen in the lung bases. Hepatobiliary: No focal liver abnormality is seen. Gallstones layer dependently in the gallbladder. No gallbladder wall thickening or biliary dilatation. Pancreas: Unremarkable. No pancreatic ductal dilatation or surrounding inflammatory changes. Spleen: Normal in size without focal abnormality. Adrenals/Urinary Tract: Adrenal glands are unremarkable. Kidneys are normal, without renal calculi, focal lesion, or hydronephrosis. Bladder is unremarkable. Stomach/Bowel: Stomach is within normal limits. Appendix appears normal. No evidence of bowel wall thickening, distention, or inflammatory changes. Vascular/Lymphatic: No significant vascular findings are present. No enlarged abdominal or pelvic lymph nodes. Reproductive: Prostate is unremarkable. Other: No abdominal wall hernia or abnormality. No abdominopelvic ascites. Musculoskeletal:  No acute or significant osseous findings. IMPRESSION: 1. No acute process in the abdomen or pelvis. 2. Moderate ground-glass opacities/consolidation in the lung bases likely reflect pneumonia. 3. Cholelithiasis. Electronically Signed   By: Zerita Boers M.D.   On: 09/07/2019 14:13   DG Chest Port 1 View  Result Date: 09/07/2019 CLINICAL DATA:  Shortness of breath, abdominal pain. EXAM: PORTABLE CHEST 1 VIEW COMPARISON:  Chest radiograph dated 07/30/2019. FINDINGS: The heart size is borderline enlarged. Moderate to severe bilateral airspace opacities are noted. There is no pleural effusion or pneumothorax. The visualized skeletal structures are unremarkable. IMPRESSION: 1. Moderate to severe bilateral airspace opacities likely represent pulmonary edema or multifocal pneumonia. Electronically Signed   By: Zerita Boers M.D.   On: 09/07/2019 14:07   VAS [place] LOWER EXTREMITY VENOUS (DVT) (ONLY [facility] & [facility])  Result Date: 09/07/2019  Lower Venous DVT Study Indications: Pain, and Swelling.   Limitations: Body habitus and patient pain with compression. Comparison Study: Prior study done 03/18/18 is on file and available for                   comparison Performing Technologist: Sharion Dove RVS  Examination Guidelines: A complete evaluation includes B-mode imaging, spectral Doppler, color Doppler, and power Doppler as needed of all accessible portions of each vessel. Bilateral testing is considered an integral part of a complete examination. Limited examinations for reoccurring indications may be performed as noted. The reflux portion of the exam is performed with the patient in reverse Trendelenburg.  Right Technical Findings: Right leg not evaluated.  +---------+---------------+---------+-----------+----------+-------------------+ LEFT     CompressibilityPhasicitySpontaneityPropertiesThrombus Aging      +---------+---------------+---------+-----------+----------+-------------------+ CFV      Full           Yes      Yes                                      +---------+---------------+---------+-----------+----------+-------------------+ SFJ      Full                                                             +---------+---------------+---------+-----------+----------+-------------------+ FV Prox                 Yes      Yes                  patent with color                                                         and Doppler         +---------+---------------+---------+-----------+----------+-------------------+ FV Mid                  Yes      Yes                  patent with color  and Doppler         +---------+---------------+---------+-----------+----------+-------------------+ FV Distal               Yes      Yes                  patent with color                                                         and Doppler          +---------+---------------+---------+-----------+----------+-------------------+ POP                     Yes      Yes                  patent with color                                                         and Doppler         +---------+---------------+---------+-----------+----------+-------------------+ PTV                                                   Not visualized      +---------+---------------+---------+-----------+----------+-------------------+ PERO                                                  Not visualized      +---------+---------------+---------+-----------+----------+-------------------+     Summary: LEFT: - Findings appear essentially unchanged compared to previous examination. - There is no evidence of deep vein thrombosis in the lower extremity. However, portions of this examination were limited- see technologist comments above.  *See table(s) above for measurements and observations.    Preliminary     Procedures Procedures (including critical care time) CRITICAL CARE Performed by: Quintella Reichert   Total critical care time: 35 minutes  Critical care time was exclusive of separately billable procedures and treating other patients.  Critical care was necessary to treat or prevent imminent or life-threatening deterioration.  Critical care was time spent personally by me on the following activities: development of treatment plan with patient and/or surrogate as well as nursing, discussions with consultants, evaluation of patient's response to treatment, examination of patient, obtaining history from patient or surrogate, ordering and performing treatments and interventions, ordering and review of laboratory studies, ordering and review of radiographic studies, pulse oximetry and re-evaluation of patient's condition.  Medications Ordered in ED Medications  cefTRIAXone (ROCEPHIN) 1 g in sodium chloride 0.9 % 100 mL IVPB (1 g Intravenous New  Bag/Given 09/07/19 1500)  morphine 4 MG/ML injection 4 mg (4 mg Intravenous Given 09/07/19 1439)  furosemide (LASIX) injection 80 mg (80 mg Intravenous Given 09/07/19 1445)  doxycycline (VIBRA-TABS) tablet 100 mg (100 mg Oral Given 09/07/19 1458)    ED Course  I have reviewed the triage vital  signs and the nursing notes.  Pertinent labs & imaging results that were available during my care of the patient were reviewed by me and considered in my medical decision making (see chart for details).    MDM Rules/Calculators/A&P                         Patient with history of diabetes, CKD here for evaluation of abdominal pain, shortness of breath. He is uncomfortable appearing on evaluation without peritoneal findings. Chest x-ray with possible pulmonary edema versus infiltrates, will treat for possible pneumonia. Labs with hyperglycemia, mild worsening in his renal function. VBG and labs are not consistent with DKA. Plan to treat with antibiotics, pain control and admit for further treatment.  Final Clinical Impression(s) / ED Diagnoses Final diagnoses:  Community acquired pneumonia of right lung, unspecified part of lung  Generalized abdominal pain    Rx / DC Orders ED Discharge Orders    None       Quintella Reichert, MD 09/07/19 [phone]

## 2019-09-07 NOTE — ED Notes (Signed)
Pt transported to Vascular 

## 2019-09-07 NOTE — Progress Notes (Signed)
Pt arrived to rm 1 from ED on 2 L [place] and iv fluid lasix. Initiated pt on tele. VSS. CHG bath done.   Lavenia Atlas, RN

## 2019-09-08 ENCOUNTER — Encounter (HOSPITAL_COMMUNITY): Payer: Medicare Other

## 2019-09-08 DIAGNOSIS — Z86718 Personal history of other venous thrombosis and embolism: Secondary | ICD-10-CM

## 2019-09-08 DIAGNOSIS — K5900 Constipation, unspecified: Secondary | ICD-10-CM

## 2019-09-08 DIAGNOSIS — N184 Chronic kidney disease, stage 4 (severe): Secondary | ICD-10-CM

## 2019-09-08 DIAGNOSIS — I5031 Acute diastolic (congestive) heart failure: Secondary | ICD-10-CM

## 2019-09-08 DIAGNOSIS — E1122 Type 2 diabetes mellitus with diabetic chronic kidney disease: Secondary | ICD-10-CM

## 2019-09-08 DIAGNOSIS — Z7901 Long term (current) use of anticoagulants: Secondary | ICD-10-CM

## 2019-09-08 DIAGNOSIS — R1084 Generalized abdominal pain: Secondary | ICD-10-CM

## 2019-09-08 DIAGNOSIS — I130 Hypertensive heart and chronic kidney disease with heart failure and stage 1 through stage 4 chronic kidney disease, or unspecified chronic kidney disease: Principal | ICD-10-CM

## 2019-09-08 DIAGNOSIS — J9601 Acute respiratory failure with hypoxia: Secondary | ICD-10-CM

## 2019-09-08 DIAGNOSIS — Z794 Long term (current) use of insulin: Secondary | ICD-10-CM

## 2019-09-08 LAB — CBC
HCT: 26.6 % — ABNORMAL LOW (ref 39.0–52.0)
Hemoglobin: 8.4 g/dL — ABNORMAL LOW (ref 13.0–17.0)
MCH: 27.4 pg (ref 26.0–34.0)
MCHC: 31.6 g/dL (ref 30.0–36.0)
MCV: 86.6 fL (ref 80.0–100.0)
Platelets: 410 10*3/uL — ABNORMAL HIGH (ref 150–400)
RBC: 3.07 MIL/uL — ABNORMAL LOW (ref 4.22–5.81)
RDW: 14.9 % (ref 11.5–15.5)
WBC: 9.5 10*3/uL (ref 4.0–10.5)
nRBC: 0 % (ref 0.0–0.2)

## 2019-09-08 LAB — RENAL FUNCTION PANEL
Albumin: 2.7 g/dL — ABNORMAL LOW (ref 3.5–5.0)
Anion gap: 12 (ref 5–15)
BUN: 129 mg/dL — ABNORMAL HIGH (ref 6–20)
CO2: 22 mmol/L (ref 22–32)
Calcium: 8.9 mg/dL (ref 8.9–10.3)
Chloride: 99 mmol/L (ref 98–111)
Creatinine, Ser: 4.68 mg/dL — ABNORMAL HIGH (ref 0.61–1.24)
GFR calc Af Amer: 17 mL/min — ABNORMAL LOW (ref >60)
GFR calc non Af Amer: 14 mL/min — ABNORMAL LOW (ref >60)
Glucose, Bld: 218 mg/dL — ABNORMAL HIGH (ref 70–99)
Phosphorus: 3.3 mg/dL (ref 2.5–4.6)
Potassium: 3.5 mmol/L (ref 3.5–5.1)
Sodium: 133 mmol/L — ABNORMAL LOW (ref 135–145)

## 2019-09-08 LAB — GLUCOSE, CAPILLARY
Glucose-Capillary: 220 mg/dL — ABNORMAL HIGH (ref 70–99)
Glucose-Capillary: 251 mg/dL — ABNORMAL HIGH (ref 70–99)
Glucose-Capillary: 186 mg/dL — ABNORMAL HIGH (ref 70–99)
Glucose-Capillary: 178 mg/dL — ABNORMAL HIGH (ref 70–99)

## 2019-09-08 MED ORDER — HYDROMORPHONE HCL 1 MG/ML IJ SOLN
1.0000 mg | Freq: Once | INTRAMUSCULAR | Status: AC
  Administered 2019-09-08: 1 mg via INTRAVENOUS
  Filled 2019-09-08: qty 1

## 2019-09-08 MED ORDER — SENNOSIDES-DOCUSATE SODIUM 8.6-50 MG PO TABS
2.0000 | ORAL_TABLET | Freq: Two times a day (BID) | ORAL | Status: DC
  Administered 2019-09-08 – 2019-09-13 (×9): 2 via ORAL
  Filled 2019-09-08 (×10): qty 2

## 2019-09-08 NOTE — Progress Notes (Signed)
  Date: 09/08/2019  Patient name: [name]  Medical record number: [id number]  Date of birth: December 12, 1975   I have seen and evaluated [name] and discussed their care with the Residency Team. Briefly, [name] is a 44 year old man well known to the teaching service who presented with HFpEF exacerbation.  He notes that he has had constipation for a few days (since last hospitalization) and because of this he has only been taking in fluids.  He notes taking in his medications okay.  He has leg swelling and SOB.    Vitals:   09/08/19 0347 09/08/19 0840  BP: (!) 145/83 (!) 142/72  Pulse: 91 100  Resp: 16 (!) 22  Temp: 97.8 F (36.6 C) 98.1 F (36.7 C)  SpO2: 94% 95%   General: Lying in bed, wearing oxygen Eyes: Anicteric sclerae HENT: Neck supple, could not assess JVP due to facial hair, will attempt again CV: RR, NR, no murmur, edema to left leg Pulm: Increased RR, crackles at bases Abd: Obese, NT, +BS MSK: He is s/p BKA on the right Psych: Unhappy with being in the hospital.   Assessment and Plan: I have seen and evaluated the patient as outlined above. I agree with the formulated Assessment and Plan as detailed in the residents' note, with the following changes:   1. Acute hypoxic respiratory failure due to acute exacerbation of HFpEF with normal EF - Continue lasix IV 120BID - Strict I/O, daily weight - PT - Agree with no Abx, no fever, no cough, no sputum - Monitor for fever - Oxygen as needed  2. Constipation - Add senna to miralax, avoid magnesium containing products  Other issues per student daily note.   Victor Falcon, MD 8/30/202110:28 AM

## 2019-09-08 NOTE — Progress Notes (Signed)
Inpatient Diabetes Program Recommendations  AACE/ADA: New Consensus Statement on Inpatient Glycemic Control (2015)  Target Ranges:  Prepandial:   less than 140 mg/dL      Peak postprandial:   less than 180 mg/dL (1-2 hours)      Critically ill patients:  140 - 180 mg/dL   Results for KOHLE, WINNER (MRN [id number]) as of 09/08/2019 11:22  Ref. Range 09/07/2019 16:42 09/07/2019 17:45 09/07/2019 18:22 09/07/2019 19:09 09/07/2019 21:32 09/08/2019 06:04  Glucose-Capillary Latest Ref Range: 70 - 99 mg/dL 310 (H)  20 units NOVOLOG  295 (H) 286 (H)  5 units NOVOLOG  264 (H) 218 (H) 220 (H)  3 units NOVOLOG    Results for JERMEY, CLOSS (MRN [id number]) as of 09/08/2019 11:22  Ref. Range 08/30/2019 03:12  Hemoglobin A1C Latest Ref Range: 4.8 - 5.6 % 5.9 (H)    Admit with: Acute hypoxic respiratory failure 2/2 Acute on chronic diastolic heart failure & ?Pneumonia  History: DM, CKD4, CHF  Home DM Meds: Tresiba 96 units QAM        Novolog 20 units with Breakfast/ 25 units with Lunch/ 30 units Dinner        Victoza 1.2 mg daily  Current Orders: Novolog Sensitive Correction Scale/ SSI (0-9 units) TID AC       Novolog 10 units TID with meals     MD- Note patient had issues with Hypoglycemia during last admission.  CBG 220 this AM.  If AM CBGs continue to trend >200, please consider starting a small portion of pt's home dose of basal insulin     --Will follow patient during hospitalization--  Wyn Quaker RN, MSN, CDE Diabetes Coordinator Inpatient Glycemic Control Team Team Pager: [phone] (8a-5p)

## 2019-09-08 NOTE — Progress Notes (Signed)
Mobility Specialist - Progress Note   09/08/19 1557  Mobility  Activity Ambulated in hall  Level of Assistance Standby assist, set-up cues, supervision of patient - no hands on  Assistive Device Wheelchair  Distance Ambulated (ft) 720 ft (Intervals: 240 ft x 3)  Mobility Response Tolerated fair  Mobility performed by Mobility specialist  $Mobility charge 1 Mobility    Pre-mobility:   On 2 L/min of O2: 83 HR, 98% SpO2  On 1 L/min of O2: 90 HR, 88% SpO2 During mobility:   On 2 L/min of O2: 108 HR, 95% SpO2  On 1 L/min of O2: 111 HR, 90% SpO2 Post-mobility: 95 HR, 95% SpO2  Pt requested to be left sitting up in his wheelchair for a bit, he was left on 2 L.min of O2 via [place]. Pt stated his biggest barrier w/ mobility is getting out of breath, he denied any muscle fatigue. He was able to transfer from the bed to his wheelchair w/o assistance.   Pricilla Handler Mobility Specialist Mobility Specialist Phone: [phone]

## 2019-09-08 NOTE — Progress Notes (Signed)
Pt does not wear CPAP and its not in the room.

## 2019-09-08 NOTE — Progress Notes (Signed)
PT Cancellation Note  Patient Details Name: [name] MRN: [id number] DOB: [birth year]   Cancelled Treatment:    Reason Eval/Treat Not Completed: Patient declined, no reason specified;Other (comment) Pt recently given pain meds and drowsy, "I feel like I am on cloud 12." Declines mobility at this time. Will follow.   [name] A [name] 09/08/2019, 1:20 PM Marisa Severin, PT, DPT Acute Rehabilitation Services Pager [phone] Office [phone]

## 2019-09-08 NOTE — Progress Notes (Signed)
Subjective: [name] is unhappy with return to the hospital and reports that he not feeling any better. He states that he was unable to receive his pain medication from the Pharmacy and his pain and symptoms did not improve since discharge on 09/02/19. Since starting furosemide last night, he has had increased urine output, but still feels swollen in his belly and left lower extremity. He has not had a bowel movement since leaving the hospital and would like to try Senna. He is still able to pass flatus, and denies any nausea or vomiting.   He reports that his SOB has not improved or worsened. He denies any chest pain. Objective: Vital signs in last 24 hours: Vitals:   09/07/19 2323 09/07/19 2339 09/08/19 0347 09/08/19 0840  BP: (!) 169/99 (!) 152/89 (!) 145/83 (!) 142/72  Pulse: 90 92 91 100  Resp: 15 15 16  (!) 22  Temp: 98.3 F (36.8 C) 98.3 F (36.8 C) 97.8 F (36.6 C) 98.1 F (36.7 C)  TempSrc: Oral Oral Axillary Oral  SpO2: 96% 94% 94% 95%  Weight:   (!) 164.6 kg    Weight change:   Intake/Output Summary (Last 24 hours) at 09/08/2019 1022 Last data filed at 09/08/2019 0840 Gross per 24 hour  Intake 262 ml  Output 3100 ml  Net -2838 ml   BP (!) 142/72 (BP Location: Right Arm)   Pulse 100   Temp 98.1 F (36.7 C) (Oral)   Resp (!) 22   Wt (!) 164.6 kg   SpO2 95%   BMI 46.59 kg/m   General Appearance:    Awake, sitting comfortably in bed, Obese habitus, NAD  Lungs:     Clear to auscultation bilaterally, respirations unlabored, On   2L [place] O2  Heart:    Regular rate and rhythm, S1 and S2 normal, no murmur, rub   or gallop  Abdomen:     Mildly distended, diffuse tenderness to palpation   no masses, no organomegaly  Extremities:  Right BKA, 3+ pitting edema in LLE with tenderness to palpation. No erythema or warmth or lesions.   Skin:   Warm and Dry   Psych:  congruent mood and affect  Neurologic: Spontaneous moves extremities. No focal neural deficits.       Medications: I have reviewed the patient's current medications. Scheduled Meds: . apixaban  5 mg Oral BID  . cloNIDine  0.3 mg Oral BID  . hydrALAZINE  50 mg Oral QID  . insulin aspart  0-9 Units Subcutaneous TID WC  . insulin aspart  10 Units Subcutaneous TID WC  . metolazone  2.5 mg Oral Daily  . oxybutynin  25 mg Oral TID  . pantoprazole  40 mg Oral Daily  . polyethylene glycol  17 g Oral BID  . pravastatin  20 mg Oral q1800  . senna-docusate  2 tablet Oral BID  . sodium chloride flush  3 mL Intravenous Q12H  . tamsulosin  0.4 mg Oral QHS  . vitamin B-12  500 mcg Oral Daily   Continuous Infusions: . sodium chloride    . furosemide 120 mg (09/08/19 0943)   PRN Meds:.sodium chloride, acetaminophen, ondansetron (ZOFRAN) IV, sodium chloride flush   [name] is 44 yo male with a medical history of HFpEF (60-65%), CKD4, insulin dependent T2DM, and history of DVT/PE who presented to the [facility] with SOB, significant left lower extremity edema, anasarca, and abdominal pain. He was admitted on 09/07/19 and being medically managed for Acute CHF exacerbation  and chronic conditions.   Assessment/Plan: Active Problems:   Insulin dependent type 2 diabetes mellitus, controlled (HCC)   History of DVT (deep vein thrombosis)   Hypertension   Constipation   Leg swelling   CKD (chronic kidney disease)   Abdominal pain   Acute diastolic (congestive) heart failure (HCC)   Acute Diastolic (Congestive) Heart Failure ([place]) Patient with HPpEF (EF 60-65%) presented with SOB, anasarca and significant right lower extremity edema.  ED labs showed BNP of 493 and CXR noted for pulmonary edema consistent with hypervolemia. Receiving IV diuretics and is currently respirating on 2L O2 [place]. He reports that his dyspnea or edema has not changed. He has had a significant increase in output since admission.Last weight was 164.4 kg. We will continue to diuresis and closely  monitor electrolytes.  --Continue  supplemental O2 with goal  --continue Metolazone 2.5mg  daily --continue IV furosemide 120mg  BID --Strict I/O --Fluid Restriction --Daily weights --monitor electrolytes --Replenish K+ prn   Chronic Kidney Disease- Stage 4 AKI, improving His creatinine has improved from 5.39 on admission to 4.67. He likely has AKI due to extravascular hypervolemia. We will continue to monitor his renal function for continued improvement.  Insulin Dependent Type 2 Diabetes Mellitus CBG this morning was 220. Holding home meds. -- Diabetes coordinator following, appreciate recs -- If AM CBGs continue to trend >200, will consider starting a small portion of patient's home dose of basal insulin --SSI     Hypertension Currently taking Carvedilol, hydralazine, clonidine at home. Today's BP 145/83 --continue on home meds    Abdominal Pain He was previously admitted for abdominal pain with a finding of gallstones on imaging. His symptoms have not improved. He reports abdominal distension and has not had a bowel movement since hospital discharge on 09/02/19. His pain is most likely multifactorial from third spacing and fluid overload from CHF exacerbation and lack of bowel movement. He has been taking Miralax with no relief, and would not like to try an enema at this time. We will a stimulant at this time.  --Senna --Zofran prn for nausea --Dilaudid prn for pain   History of DVT/PE Prophylactic anticoagulation  --continue Eliquis 5mg       This is a Careers information officer Note.  The care of the patient was discussed with Dr. Shon Baton and the assessment and plan formulated with their assistance.  Please see their attached note for official documentation of the daily encounter.   LOS: 1 day   Jacklynn Bue, Medical Student 09/08/2019, 10:22 AM

## 2019-09-08 NOTE — Progress Notes (Signed)
Internal Medicine Clinic Resident  I have personally reviewed this encounter including the documentation in this note and/or discussed this patient with the care management provider. I will address any urgent items identified by the care management provider and will communicate my actions to the patient's PCP. I have reviewed the patient's CCM visit with my supervising attending, Dr Heber [place].  Plan: Patient was hospitalized over the weekend.   Jose Persia, MD 09/08/2019

## 2019-09-08 NOTE — Progress Notes (Signed)
Placed patient on CPAP, auto titrate, with 3L O2 bleed in.

## 2019-09-08 NOTE — Plan of Care (Signed)
Poc progressing.  

## 2019-09-09 ENCOUNTER — Ambulatory Visit: Payer: Medicare Other | Admitting: Endocrinology

## 2019-09-09 LAB — RENAL FUNCTION PANEL
Albumin: 2.7 g/dL — ABNORMAL LOW (ref 3.5–5.0)
Anion gap: 13 (ref 5–15)
BUN: 125 mg/dL — ABNORMAL HIGH (ref 6–20)
CO2: 23 mmol/L (ref 22–32)
Calcium: 9.3 mg/dL (ref 8.9–10.3)
Chloride: 97 mmol/L — ABNORMAL LOW (ref 98–111)
Creatinine, Ser: 4.18 mg/dL — ABNORMAL HIGH (ref 0.61–1.24)
GFR calc Af Amer: 19 mL/min — ABNORMAL LOW (ref >60)
GFR calc non Af Amer: 16 mL/min — ABNORMAL LOW (ref >60)
Glucose, Bld: 219 mg/dL — ABNORMAL HIGH (ref 70–99)
Phosphorus: 4.2 mg/dL (ref 2.5–4.6)
Potassium: 3.8 mmol/L (ref 3.5–5.1)
Sodium: 133 mmol/L — ABNORMAL LOW (ref 135–145)

## 2019-09-09 LAB — GLUCOSE, CAPILLARY
Glucose-Capillary: 197 mg/dL — ABNORMAL HIGH (ref 70–99)
Glucose-Capillary: 257 mg/dL — ABNORMAL HIGH (ref 70–99)
Glucose-Capillary: 217 mg/dL — ABNORMAL HIGH (ref 70–99)
Glucose-Capillary: 295 mg/dL — ABNORMAL HIGH (ref 70–99)
Glucose-Capillary: 316 mg/dL — ABNORMAL HIGH (ref 70–99)

## 2019-09-09 LAB — CBC
HCT: 28.4 % — ABNORMAL LOW (ref 39.0–52.0)
Hemoglobin: 8.8 g/dL — ABNORMAL LOW (ref 13.0–17.0)
MCH: 27.3 pg (ref 26.0–34.0)
MCHC: 31 g/dL (ref 30.0–36.0)
MCV: 88.2 fL (ref 80.0–100.0)
Platelets: 426 10*3/uL — ABNORMAL HIGH (ref 150–400)
RBC: 3.22 MIL/uL — ABNORMAL LOW (ref 4.22–5.81)
RDW: 14.9 % (ref 11.5–15.5)
WBC: 9.9 10*3/uL (ref 4.0–10.5)
nRBC: 0 % (ref 0.0–0.2)

## 2019-09-09 MED ORDER — INSULIN ASPART 100 UNIT/ML ~~LOC~~ SOLN
12.0000 [IU] | Freq: Once | SUBCUTANEOUS | Status: AC
  Administered 2019-09-09: 12 [IU] via SUBCUTANEOUS

## 2019-09-09 MED ORDER — SIMETHICONE 80 MG PO CHEW
80.0000 mg | CHEWABLE_TABLET | Freq: Four times a day (QID) | ORAL | Status: DC
  Administered 2019-09-09 – 2019-09-14 (×16): 80 mg via ORAL
  Filled 2019-09-09 (×18): qty 1

## 2019-09-09 MED ORDER — PEG 3350-KCL-NA BICARB-NACL 420 G PO SOLR
4000.0000 mL | Freq: Once | ORAL | Status: AC
  Administered 2019-09-09: 4000 mL via ORAL
  Filled 2019-09-09 (×2): qty 4000

## 2019-09-09 NOTE — Progress Notes (Signed)
Mobility Specialist: Progress Note   09/09/19 1815  Mobility  Activity  (Cancel)   Instructed by RN to not see pt today.   [facility] Advocate Sarye Kath Mobility Specialist

## 2019-09-09 NOTE — Progress Notes (Signed)
Subjective: [name] reports that he is still very uncomfortable today and has not had any improvement in his abdominal pain.He received Dilaudid 1mg  yesterday afternoon and felt it was too strong, he told the nurse that he "felt like he was on [place]". He reports nausea, but no vomiting. He has not had a bowel movement yet, but still has flatus. He reports that his SOB is a "pinch" better. Yesterday afternoon, he was able to work with the Mobility Specialist and felt that went well.    Objective: Vital signs in last 24 hours: Vitals:   09/08/19 1955 09/08/19 2312 09/09/19 0356 09/09/19 0717  BP: (!) 149/81 (!) 174/89 (!) 158/71 (!) 152/75  Pulse: 85 87 91 94  Resp: 15 20 15 19   Temp: 97.9 F (36.6 C) (!) 97.4 F (36.3 C) 98.6 F (37 C) 98.5 F (36.9 C)  TempSrc: Oral Oral Oral Oral  SpO2: 96% 96% 96% 96%  Weight:   (!) 163.5 kg    Weight change: -1.1 kg  Intake/Output Summary (Last 24 hours) at 09/09/2019 1309 Last data filed at 09/09/2019 1000 Gross per 24 hour  Intake 360 ml  Output 4800 ml  Net -4440 ml   BP (!) 152/75 (BP Location: Right Wrist)   Pulse 94   Temp 98.5 F (36.9 C) (Oral)   Resp 19   Wt (!) 163.5 kg   SpO2 96%   BMI 46.28 kg/m  General Appearance:    Awake, sitting in bed, Obese habitus, NAD  Lungs:     Clear to auscultation bilaterally, respirations unlabored, On   2L [place] O2  Heart:   Regular rate and rhythm, S1 and S2 normal, no murmur, rub  or gallop  Abdomen:    Mildly distended  Extremities:  Right BKA, 3+ pitting edema in LLE with tenderness to palpation. No erythema or warmth or lesions.   Skin:   Warm and Dry   Psych:  Frustrated. Congruent mood and affect  Neurologic: Spontaneous moves extremities. No focal neural deficits.      Lab Results: @LABTEST2 @ Micro Results: Recent Results (from the past 240 hour(s))  Culture, blood (routine x 2)     Status: None (Preliminary result)   Collection Time: 09/07/19  2:46 PM   Specimen: BLOOD    Result Value Ref Range Status   Specimen Description BLOOD RIGHT ANTECUBITAL  Final   Special Requests   Final    BOTTLES DRAWN AEROBIC AND ANAEROBIC Blood Culture adequate volume   Culture   Final    NO GROWTH 2 DAYS Performed at [facility], [address], [place],  [address]    Report Status PENDING  Incomplete  SARS Coronavirus 2 by RT PCR (hospital order, performed in [facility] hospital lab) Nasopharyngeal Nasopharyngeal Swab     Status: None   Collection Time: 09/07/19  3:37 PM   Specimen: Nasopharyngeal Swab  Result Value Ref Range Status   SARS Coronavirus 2 NEGATIVE NEGATIVE Final    Comment: (NOTE) SARS-CoV-2 target nucleic acids are NOT DETECTED.  The SARS-CoV-2 RNA is generally detectable in upper and lower respiratory specimens during the acute phase of infection. The lowest concentration of SARS-CoV-2 viral copies this assay can detect is 250 copies / mL. A negative result does not preclude SARS-CoV-2 infection and should not be used as the sole basis for treatment or other patient management decisions.  A negative result may occur with improper specimen collection / handling, submission of specimen other  than nasopharyngeal swab, presence of viral mutation(s) within the areas targeted by this assay, and inadequate number of viral copies (<250 copies / mL). A negative result must be combined with clinical observations, patient history, and epidemiological information.  Fact Sheet for Patients:   StrictlyIdeas.no  Fact Sheet for Healthcare Providers: BankingDealers.co.za  This test is not yet approved or  cleared by the [place] FDA and has been authorized for detection and/or diagnosis of SARS-CoV-2 by FDA under an Emergency Use Authorization (EUA).  This EUA will remain in effect (meaning this test can be used) for the duration of the COVID-19 declaration under Section 564(b)(1) of the Act, 21  U.S.C. section 360bbb-3(b)(1), unless the authorization is terminated or revoked sooner.  Performed at [facility], [place] [address] [place], [place] [address]   Culture, blood (routine x 2)     Status: None (Preliminary result)   Collection Time: 09/07/19  7:17 PM   Specimen: BLOOD  Result Value Ref Range Status   Specimen Description BLOOD LEFT ANTECUBITAL  Final   Special Requests   Final    BOTTLES DRAWN AEROBIC ONLY Blood Culture adequate volume   Culture   Final    NO GROWTH 2 DAYS Performed at [facility], [place] [address], [place], [place] [address]    Report Status PENDING  Incomplete   Studies/Results: CT ABDOMEN PELVIS WO CONTRAST  Result Date: 09/07/2019 CLINICAL DATA:  Abdominal distension and pain. EXAM: CT ABDOMEN AND PELVIS WITHOUT CONTRAST TECHNIQUE: Multidetector CT imaging of the abdomen and pelvis was performed following the standard protocol without IV contrast. COMPARISON:  CT abdomen pelvis dated 08/29/2019. FINDINGS: Lower chest: Moderate ground-glass opacities/consolidation are seen in the lung bases. Hepatobiliary: No focal liver abnormality is seen. Gallstones layer dependently in the gallbladder. No gallbladder wall thickening or biliary dilatation. Pancreas: Unremarkable. No pancreatic ductal dilatation or surrounding inflammatory changes. Spleen: Normal in size without focal abnormality. Adrenals/Urinary Tract: Adrenal glands are unremarkable. Kidneys are normal, without renal calculi, focal lesion, or hydronephrosis. Bladder is unremarkable. Stomach/Bowel: Stomach is within normal limits. Appendix appears normal. No evidence of bowel wall thickening, distention, or inflammatory changes. Vascular/Lymphatic: No significant vascular findings are present. No enlarged abdominal or pelvic lymph nodes. Reproductive: Prostate is unremarkable. Other: No abdominal wall hernia or abnormality. No abdominopelvic ascites. Musculoskeletal: No acute or significant  osseous findings. IMPRESSION: 1. No acute process in the abdomen or pelvis. 2. Moderate ground-glass opacities/consolidation in the lung bases likely reflect pneumonia. 3. Cholelithiasis. Electronically Signed   By: Zerita Boers M.D.   On: 09/07/2019 14:13   DG Chest Port 1 View  Result Date: 09/07/2019 CLINICAL DATA:  Shortness of breath, abdominal pain. EXAM: PORTABLE CHEST 1 VIEW COMPARISON:  Chest radiograph dated 07/30/2019. FINDINGS: The heart size is borderline enlarged. Moderate to severe bilateral airspace opacities are noted. There is no pleural effusion or pneumothorax. The visualized skeletal structures are unremarkable. IMPRESSION: 1. Moderate to severe bilateral airspace opacities likely represent pulmonary edema or multifocal pneumonia. Electronically Signed   By: Zerita Boers M.D.   On: 09/07/2019 14:07   VAS [place] LOWER EXTREMITY VENOUS (DVT) (ONLY [facility] & [facility])  Result Date: 09/07/2019  Lower Venous DVT Study Indications: Pain, and Swelling.  Limitations: Body habitus and patient pain with compression. Comparison Study: Prior study done 03/18/18 is on file and available for                   comparison Performing Technologist: Sharion Dove  RVS  Examination Guidelines: A complete evaluation includes B-mode imaging, spectral Doppler, color Doppler, and power Doppler as needed of all accessible portions of each vessel. Bilateral testing is considered an integral part of a complete examination. Limited examinations for reoccurring indications may be performed as noted. The reflux portion of the exam is performed with the patient in reverse Trendelenburg.  Right Technical Findings: Right leg not evaluated.  +---------+---------------+---------+-----------+----------+-------------------+ LEFT     CompressibilityPhasicitySpontaneityPropertiesThrombus Aging      +---------+---------------+---------+-----------+----------+-------------------+ CFV      Full           Yes      Yes                                       +---------+---------------+---------+-----------+----------+-------------------+ SFJ      Full                                                             +---------+---------------+---------+-----------+----------+-------------------+ FV Prox                 Yes      Yes                  patent with color                                                         and Doppler         +---------+---------------+---------+-----------+----------+-------------------+ FV Mid                  Yes      Yes                  patent with color                                                         and Doppler         +---------+---------------+---------+-----------+----------+-------------------+ FV Distal               Yes      Yes                  patent with color                                                         and Doppler         +---------+---------------+---------+-----------+----------+-------------------+ POP                     Yes      Yes                  patent with color  and Doppler         +---------+---------------+---------+-----------+----------+-------------------+ PTV                                                   Not visualized      +---------+---------------+---------+-----------+----------+-------------------+ PERO                                                  Not visualized      +---------+---------------+---------+-----------+----------+-------------------+     Summary: LEFT: - Findings appear essentially unchanged compared to previous examination. - There is no evidence of deep vein thrombosis in the lower extremity. However, portions of this examination were limited- see technologist comments above.  *See table(s) above for measurements and observations. Electronically signed by Servando Snare MD on 09/07/2019 at 7:42:00 PM.    Final     Medications: I have reviewed the patient's current medications. Scheduled Meds: . apixaban  5 mg Oral BID  . cloNIDine  0.3 mg Oral BID  . hydrALAZINE  50 mg Oral QID  . insulin aspart  0-9 Units Subcutaneous TID WC  . insulin aspart  10 Units Subcutaneous TID WC  . metolazone  2.5 mg Oral Daily  . oxybutynin  25 mg Oral TID  . pantoprazole  40 mg Oral Daily  . pravastatin  20 mg Oral q1800  . senna-docusate  2 tablet Oral BID  . simethicone  80 mg Oral QID  . sodium chloride flush  3 mL Intravenous Q12H  . tamsulosin  0.4 mg Oral QHS  . vitamin B-12  500 mcg Oral Daily   Continuous Infusions: . sodium chloride    . furosemide 120 mg (09/09/19 0930)   PRN Meds:.sodium chloride, acetaminophen, ondansetron (ZOFRAN) IV, sodium chloride flush  [name] is 44 yo male with a medical history of HFpEF (60-65%), CKD4, insulin dependent T2DM, and history of DVT/PE who presented to the [facility] with SOB, significant left lower extremity edema, anasarca, and abdominal pain. He was admitted on 09/07/19 and being medically managed for Acute CHF exacerbation and chronic conditions.   Assessment/Plan: Active Problems:   Insulin dependent type 2 diabetes mellitus, controlled (HCC)   History of DVT (deep vein thrombosis)   Hypertension   Constipation   Leg swelling   CKD (chronic kidney disease)   Abdominal pain   Acute diastolic (congestive) heart failure (HCC)  Acute Diastolic (Congestive) Heart Failure ([place]) Patient with HPpEF (EF 60-65%) presented with SOB, anasarca and significant right lower extremity edema.  ED labs showed BNP of 493 and CXR noted for pulmonary edema consistent with hypervolemia. Receiving IV diuretics and is currently respirating on 2L O2 [place]. He reports that his dyspnea or edema has not changed. He has had a significant increase in output since admission. We will continue to diuresis and closely  monitor electrolytes.  --Continue supplemental O2 with goal  --continue  Metolazone 2.5mg  daily --continue IV furosemide 120mg  BID --Strict I/O --Fluid Restriction --Daily weights --monitor electrolytes --Replenish K+ prn   Chronic Kidney Disease- Stage 4 AKI, improving His creatinine has improved from 4.67 on admission to 4.18. He likely has AKI due to extravascular hypervolemia. We will continue with IV diuresis and monitoring his renal function  for continued improvement. --renal diet  Insulin Dependent Type 2 Diabetes Mellitus CBG this morning was 197. Holding home meds. -- Diabetes coordinator following, appreciate recs -- If AM CBGs continue to trend >200, will consider starting a small portion of patient's home dose of basal insulin --SSI     Hypertension Currently taking Carvedilol, hydralazine, clonidine at home. BP has been trending 150s-140s/80s-70s over the last 24hrs --continue on home meds    Abdominal Pain His symptoms have not improved. He reports abdominal distension, nauesa and has not had a bowel movement since hospital discharge on 09/02/19.He has not had relief with Miralax or Senna, and would not like to try an enema at this time. We discussed the risk associated if continues with no bowel movement. CT did not show any acute process in abdomen or pelvis. We recommended Simethicone and he agreed, he would also like to try GoLYTELY.  --Polyeythylene glycol (NuLYTELY) solution 4000 mL --Zofran prn for nausea  --Simethicone tablet 80mg     History of DVT/PE Prophylactic anticoagulation  --continue Eliquis 5mg     This is a Careers information officer Note.  The care of the patient was discussed with Dr. Daryll Drown and the assessment and plan formulated with their assistance.  Please see their attached note for official documentation of the daily encounter.   LOS: 2 days   Jacklynn Bue, Medical Student 09/09/2019, 1:09 PM

## 2019-09-09 NOTE — Progress Notes (Signed)
Patient started on medication regimen as per doctors order with Nulytely. Patient states he has drank a few sips since starting at 1215. Patient educated about medication expectations and potential consequences of non-compliance.   [name]

## 2019-09-09 NOTE — Progress Notes (Signed)
PT Cancellation Note  Patient Details Name: [name] MRN: [id number] DOB: 10/24/1975   Cancelled Treatment:    Reason Eval/Treat Not Completed: Other (comment);Patient declined, no reason specified Pt reports not feeling well this morning, with nausea. Wants to wait for doctors to round before working with PT. Will follow up as time allows.   Marguarite Arbour A Eliot Bencivenga 09/09/2019, 9:03 AM Marisa Severin, PT, DPT Acute Rehabilitation Services Pager [phone] Office [phone]

## 2019-09-09 NOTE — Progress Notes (Signed)
Pt does not wear CPAP and its not in the room.

## 2019-09-09 NOTE — Progress Notes (Signed)
Pt has only drank 3 cups of his Nulytely, however he mixed juice with the already diluted medication, so has not received a full 3 cups. Pt re-educated on the importance of drinking as much as he can. Pt has still not had a bowel movement. Pt also refused his scheduled simethicone.

## 2019-09-09 NOTE — Progress Notes (Signed)
OT Cancellation Note  Patient Details Name: [name] MRN: [id number] DOB: [birth year]   Cancelled Treatment:    Reason Eval/Treat Not Completed: Other (comment); reports nausea this AM, declining working with OT. Will follow up as able.  Lou Cal, OT Acute Rehabilitation Services Pager [phone] Office [phone]   Raymondo Band 09/09/2019, 10:39 AM

## 2019-09-09 NOTE — Progress Notes (Signed)
Inpatient Diabetes Program Recommendations  AACE/ADA: New Consensus Statement on Inpatient Glycemic Control (2015)  Target Ranges:  Prepandial:   less than 140 mg/dL      Peak postprandial:   less than 180 mg/dL (1-2 hours)      Critically ill patients:  140 - 180 mg/dL   Results for [name], [name] (MRN [id number]) as of 09/09/2019 12:58  Ref. Range 09/09/2019 06:02 09/09/2019 11:59  Glucose-Capillary Latest Ref Range: 70 - 99 mg/dL 197 (H)  2 units NOVOLOG  257 (H)  15 units NOVOLOG     Home DM Meds: Tresiba 96 units QAM                              Novolog 20 units with Breakfast/ 25 units with Lunch/ 30 units Dinner                              Victoza 1.2 mg daily  Current Orders: Novolog Sensitive Correction Scale/ SSI (0-9 units) TID AC                             Novolog 10 units TID with meals     MD- Note patient had issues with Hypoglycemia during last admission.  CBG 197 this AM and 257 at 12pm.  Of note, Novolog 10 units meal coverage NOT given this AM  Please consider adding Levemir 8 units QHS (0.05 units/kg)    --Will follow patient during hospitalization--  Wyn Quaker RN, MSN, CDE Diabetes Coordinator Inpatient Glycemic Control Team Team Pager: [phone] (8a-5p)

## 2019-09-10 ENCOUNTER — Encounter: Payer: Medicare Other | Admitting: Internal Medicine

## 2019-09-10 ENCOUNTER — Encounter: Payer: Self-pay | Admitting: Licensed Clinical Social Worker

## 2019-09-10 ENCOUNTER — Telehealth: Payer: Self-pay | Admitting: Dietician

## 2019-09-10 DIAGNOSIS — F4321 Adjustment disorder with depressed mood: Secondary | ICD-10-CM

## 2019-09-10 LAB — COMPREHENSIVE METABOLIC PANEL
ALT: 23 U/L (ref 0–44)
AST: 18 U/L (ref 15–41)
Albumin: 2.9 g/dL — ABNORMAL LOW (ref 3.5–5.0)
Alkaline Phosphatase: 107 U/L (ref 38–126)
Anion gap: 15 (ref 5–15)
BUN: 110 mg/dL — ABNORMAL HIGH (ref 6–20)
CO2: 23 mmol/L (ref 22–32)
Calcium: 9.6 mg/dL (ref 8.9–10.3)
Chloride: 98 mmol/L (ref 98–111)
Creatinine, Ser: 3.91 mg/dL — ABNORMAL HIGH (ref 0.61–1.24)
GFR calc Af Amer: 21 mL/min — ABNORMAL LOW (ref >60)
GFR calc non Af Amer: 18 mL/min — ABNORMAL LOW (ref >60)
Glucose, Bld: 264 mg/dL — ABNORMAL HIGH (ref 70–99)
Potassium: 3.8 mmol/L (ref 3.5–5.1)
Sodium: 136 mmol/L (ref 135–145)
Total Bilirubin: 0.4 mg/dL (ref 0.3–1.2)
Total Protein: 7.7 g/dL (ref 6.5–8.1)

## 2019-09-10 LAB — CBC
HCT: 30.2 % — ABNORMAL LOW (ref 39.0–52.0)
Hemoglobin: 9.3 g/dL — ABNORMAL LOW (ref 13.0–17.0)
MCH: 27 pg (ref 26.0–34.0)
MCHC: 30.8 g/dL (ref 30.0–36.0)
MCV: 87.8 fL (ref 80.0–100.0)
Platelets: 467 10*3/uL — ABNORMAL HIGH (ref 150–400)
RBC: 3.44 MIL/uL — ABNORMAL LOW (ref 4.22–5.81)
RDW: 15 % (ref 11.5–15.5)
WBC: 9.9 10*3/uL (ref 4.0–10.5)
nRBC: 0 % (ref 0.0–0.2)

## 2019-09-10 LAB — GLUCOSE, CAPILLARY
Glucose-Capillary: 245 mg/dL — ABNORMAL HIGH (ref 70–99)
Glucose-Capillary: 285 mg/dL — ABNORMAL HIGH (ref 70–99)
Glucose-Capillary: 349 mg/dL — ABNORMAL HIGH (ref 70–99)
Glucose-Capillary: 243 mg/dL — ABNORMAL HIGH (ref 70–99)

## 2019-09-10 MED ORDER — INSULIN GLARGINE 100 UNIT/ML ~~LOC~~ SOLN
8.0000 [IU] | Freq: Every day | SUBCUTANEOUS | Status: DC
  Filled 2019-09-10: qty 0.08

## 2019-09-10 MED ORDER — BISACODYL 10 MG RE SUPP
10.0000 mg | Freq: Once | RECTAL | Status: AC
  Administered 2019-09-10: 10 mg via RECTAL
  Filled 2019-09-10: qty 1

## 2019-09-10 MED ORDER — INSULIN GLARGINE 100 UNIT/ML ~~LOC~~ SOLN
20.0000 [IU] | Freq: Every day | SUBCUTANEOUS | Status: DC
  Administered 2019-09-10: 20 [IU] via SUBCUTANEOUS
  Filled 2019-09-10 (×2): qty 0.2

## 2019-09-10 MED ORDER — LACTULOSE 10 GM/15ML PO SOLN
10.0000 g | Freq: Three times a day (TID) | ORAL | Status: DC
  Administered 2019-09-10 – 2019-09-12 (×4): 10 g via ORAL
  Filled 2019-09-10 (×6): qty 15

## 2019-09-10 NOTE — Progress Notes (Signed)
Inpatient Diabetes Program Recommendations  AACE/ADA: New Consensus Statement on Inpatient Glycemic Control (2015)  Target Ranges:  Prepandial:   less than 140 mg/dL      Peak postprandial:   less than 180 mg/dL (1-2 hours)      Critically ill patients:  140 - 180 mg/dL   Results for HARROL, NOVELLO (MRN [id number]) as of 09/09/2019 12:58  Ref. Range 09/09/2019 06:02 09/09/2019 11:59  Glucose-Capillary Latest Ref Range: 70 - 99 mg/dL 197 (H)  2 units NOVOLOG  257 (H)  15 units NOVOLOG   Results for SHONDELL, POULSON (MRN [id number]) as of 09/10/2019 09:09  Ref. Range 09/09/2019 15:08 09/09/2019 18:19 09/09/2019 22:15 09/10/2019 06:09  Glucose-Capillary Latest Ref Range: 70 - 99 mg/dL 217 (H) 295 (H) 316 (H) 245 (H)    Home DM Meds: Tresiba 96 units QAM                              Novolog 20 units with Breakfast/ 25 units with Lunch/ 30 units Dinner                              Victoza 1.2 mg daily  Current Orders: Novolog Sensitive Correction Scale/ SSI (0-9 units) TID AC                             Novolog 10 units TID with meals   Creat/BUN: 3.91/110  MD-   Please consider adding Levemir 10 units QHS   --Will follow patient during hospitalization--  Tama Headings RN, MSN, BC-ADM Inpatient Diabetes Coordinator Team Pager [phone] (8a-5p)

## 2019-09-10 NOTE — Telephone Encounter (Signed)
[name] requested a supportive visit while an inpatient.

## 2019-09-10 NOTE — Progress Notes (Signed)
PT Cancellation Note  Patient Details Name: [name] MRN: [id number] DOB: 02-03-1975   Cancelled Treatment:    Reason Eval/Treat Not Completed: Patient declined, no reason specified;Fatigue/lethargy limiting ability to participate - OT in room, RN planning to give suppository for pt constipation. RN requests PT check back later, will check back as schedule allows.  [place] Pager [phone]  Office [phone]    [place] 09/10/2019, 10:16 AM

## 2019-09-10 NOTE — Progress Notes (Signed)
Subjective: [name] is still experiencing abdominal pain and reports nausea and vomiting overnight. He has not had a bowel movement in over a week despite osmotic and stimulant agents. He is very tired frustrated. He still reports flatus.  In regards to his dyspnea and swelling, he has not noticed any improvement. He reports that he is feeling thirsty.   His nurse reports that he seems to have decreased mood over the last couple of days. Objective:  Vital signs in last 24 hours: Vitals:   09/09/19 2156 09/10/19 0025 09/10/19 0416 09/10/19 0814  BP:  132/62 (!) 155/77 (!) 158/78  Pulse: 97 96 99 (!) 103  Resp: 20 16 20 19   Temp:  97.8 F (36.6 C) 98.6 F (37 C) 98.3 F (36.8 C)  TempSrc:  Oral Oral Oral  SpO2: 96% 96% 94% 94%  Weight:   (!) 163.5 kg    Weight change: 0 kg  Intake/Output Summary (Last 24 hours) at 09/10/2019 1329 Last data filed at 09/10/2019 1115 Gross per 24 hour  Intake 480 ml  Output 4375 ml  Net -3895 ml    General Appearance: Awake, sitting in bed, Obese habitus, NAD  Lungs: Clear to auscultation bilaterally, respirations unlabored, On 2L [place] O2  Heart: Regular rate and rhythm, S1 and S2 normal, no murmur, rub or gallop  Abdomen: Mildly distended  Extremities: Right BKA, 3+ pitting edema in LLE with tenderness to palpation. No erythema or warmth or lesions.  Skin: Warm and Dry  Psych: Frustrated. Congruent mood and affect  Neurologic: Spontaneous moves extremities. No focal neural deficits.   Assessment/Plan:  Active Problems:   Insulin dependent type 2 diabetes mellitus, controlled (HCC)   History of DVT (deep vein thrombosis)   Hypertension   Constipation   Leg swelling   CKD (chronic kidney disease)   Abdominal pain   Acute diastolic (congestive) heart failure (HCC)    Acute Diastolic (Congestive) Heart Failure ([place]) Improving Patient with HPpEF (EF 60-65%) presented with SOB, anasarca and significant right lower extremity edema. ED  labs showed BNP of 493 and CXR noted for pulmonary edema consistent with hypervolemia. Receiving IV diuretics and is currently respirating on 2L O2 [place]. He reports that his dyspnea or edema has not changed. He has had a significant increase in output since admission (net output: -9.97L). We will continue to diuresis and closely monitor electrolytes.  --Continue supplemental O2 with goal  --continue Metolazone 2.5mg  daily --continue IV furosemide 120mg  BID --Strict I/O --Fluid Restriction --Daily weights --monitor electrolytes --Replenish K+ prn   Chronic Kidney Disease- Stage 4 AKI, improving His creatinine been trending downward since admission.He likely had AKI due to extravascular hypervolemia. We will continue with IV diuresis and monitoring his renal functionfor continued improvement. -see above  Insulin Dependent Type 2 Diabetes Mellitus CBGs have been elevated (316-197) over the last 24hrs. We are holding home meds. With recommendation from Diabetes Coordinator we will start  8 units of basal insulin. -- Diabetes coordinator following, appreciate recs --Lantus 8 units QHS --SSI    Hypertension Currently taking Carvedilol, hydralazine, clonidine at home. BP has been trending 150s-140s/80s-70s over the last 24hrs --continue on home meds   Abdominal Pain His symptoms have not improved. He reports abdominal distension, nauesa and has not had a bowel movement since hospital discharge on 09/02/19.His abdominal pain is mostly like due to constipation.He has not had relief with Miralax or Senna, or NuLTYELY. We discussed other treatment options for constipation and the importance of  consistency with treatment. We recommended adding Lactulose and Bisacodyl suppository and he is in agreement.  --start Bisacodyl Suppository 10mg  --Lactulose solution TID --Zofran prn for nausea   --Simethicone tablet 80mg     History of DVT/PE Prophylactic anticoagulation  --continue  Eliquis 5mg   Decreased Mood He is very frustrated with progression of symptoms and no relief. Nursing reports that he has a decreased mood over the last couple of days. His last PHQ-2 score was 0 (08/20/19). His mood is likely due to health progress, but we will check his TSH levels.  -TSH    LOS: 3 days   Jacklynn Bue, Medical Student 09/10/2019, 1:29 PM

## 2019-09-10 NOTE — Progress Notes (Signed)
Mobility Specialist: Progress Note   09/10/19 1305  Mobility  Activity  (Cancel)   Instructed by RN to not work w/ pt today.   [facility] Guillaume Weninger Mobility Specialist

## 2019-09-10 NOTE — Evaluation (Signed)
Occupational Therapy Evaluation Patient Details Name: [name] MRN: [id number] DOB: 07-02-1975 Today's Date: 09/10/2019    History of Present Illness Mr. Deason is 44 yo male with a medical history of HFpEF (60-65%), CKD4, insulin dependent T2DM, and history of DVT/PE who presented to the [facility] with SOB, significant left lower extremity edema, anasarca, and abdominal pain. He was admitted on 09/07/19 and being medically managed for Acute CHF exacerbation and chronic conditions.    Clinical Impression   PTA Pt at home, lives alone but has an aide that comes M-F for 8 hours a day, he reports enjoying going to the gym. Pt today is at baseline for ADL. Able to perform transfers at supervision level (to his WC) and did a brief sponge bath sitting EOB. Pt does not need further skilled OT at this time. Pt left in room with RN getting suppository. OT to sign off at this time.     Follow Up Recommendations  Other (comment) (continue [place] Aide)    Equipment Recommendations  None recommended by OT (Pt has appropriate DME)    Recommendations for Other Services       Precautions / Restrictions Precautions Precautions: Other (comment) Precaution Comments: R BKA (prior) but prosthetic at home Restrictions Weight Bearing Restrictions: No      Mobility Bed Mobility Overal bed mobility: Modified Independent                Transfers Overall transfer level: Needs assistance Equipment used: None Transfers: Lateral/Scoot Transfers          Lateral/Scoot Transfers: Supervision General transfer comment: set-up of WC placement, then pt independent with lateral scoot (only does sit-stand with RW with his prosthetic which is at home)    Balance Overall balance assessment: Mild deficits observed, not formally tested                                         ADL either performed or assessed with clinical judgement   ADL Overall ADL's : At baseline                                        General ADL Comments: Pt was able to bathe himself, with a little assist for "hard to reach places" which is his baseline, he has an aide that is with him M-F 8 hours a day to assist with ADL     Vision Patient Visual Report: No change from baseline       Perception     Praxis      Pertinent Vitals/Pain Pain Assessment: Faces Faces Pain Scale: Hurts little more Pain Location: lower abdomen Pain Descriptors / Indicators: Constant;Discomfort Pain Intervention(s): Monitored during session;Repositioned     Hand Dominance Right   Extremity/Trunk Assessment Upper Extremity Assessment Upper Extremity Assessment: Overall WFL for tasks assessed   Lower Extremity Assessment Lower Extremity Assessment: LLE deficits/detail RLE Deficits / Details: OLD BKA LLE Deficits / Details: edemous throughout LLE LLE Sensation: WNL LLE Coordination: WNL   Cervical / Trunk Assessment Cervical / Trunk Assessment: Other exceptions Cervical / Trunk Exceptions: morbid obesity   Communication Communication Communication: No difficulties   Cognition Arousal/Alertness: Awake/alert Behavior During Therapy: Flat affect Overall Cognitive Status: Within Functional Limits for tasks assessed  General Comments  Pt flat throughout session, he was a basketball coach at [employer] and likes the panthers and [employer] (football)    Exercises     Shoulder Instructions      Home Living Family/patient expects to be discharged to:: Private residence Living Arrangements: Alone Available Help at Discharge: Available PRN/intermittently ([place] M-F 8 hours/day) Type of Home: Apartment Home Access: Level entry     Home Layout: One level     Bathroom Shower/Tub: Teacher, early years/pre: [place]: Environmental consultant - 2 wheels;Wheelchair - manual;Tub bench;Grab bars - tub/shower;Grab bars - toilet           Prior Functioning/Environment Level of Independence: Needs assistance  Gait / Transfers Assistance Needed: ambulates short distance with RW and prosthetic, WC for mobility. able to transfer independently ADL's / Homemaking Assistance Needed: Pt does 75% of bathing - Aide assists with "hard to reach places"            OT Problem List: Decreased activity tolerance;Obesity;Increased edema      OT Treatment/Interventions:      OT Goals(Current goals can be found in the care plan section) Acute Rehab OT Goals Patient Stated Goal: decrease abdominal pain OT Goal Formulation: With patient Time For Goal Achievement: 09/24/19 Potential to Achieve Goals: Good  OT Frequency:     Barriers to D/C:            Co-evaluation              AM-PAC OT "6 Clicks" Daily Activity     Outcome Measure Help from another person eating meals?: None Help from another person taking care of personal grooming?: A Little Help from another person toileting, which includes using toliet, bedpan, or urinal?: A Little Help from another person bathing (including washing, rinsing, drying)?: A Little Help from another person to put on and taking off regular upper body clothing?: None Help from another person to put on and taking off regular lower body clothing?: A Little 6 Click Score: 20   End of Session Nurse Communication: Mobility status;Precautions  Activity Tolerance: Patient tolerated treatment well Patient left: in bed;with call bell/phone within reach;with nursing/sitter in room (getting suppository)  OT Visit Diagnosis: Pain;Other abnormalities of gait and mobility (R26.89) Pain - part of body:  (abdomen)                Time: 1005-1031 OT Time Calculation (min): 26 min Charges:  OT General Charges $OT Visit: 1 Visit OT Evaluation $OT Eval Low Complexity: 1 Low OT Treatments $Self Care/Home Management : 8-22 mins  Jesse Sans OTR/L Acute Rehabilitation Services Pager:  [phone] Office: [name] 09/10/2019, 12:09 PM

## 2019-09-10 NOTE — Hospital Course (Addendum)
[name] is 44 yo male with a medical history of HFpEF (60-65%), CKD4, insulin dependent T2DM, and history of DVT/PE who presented to the [facility] with SOB, significant left lower extremity edema, anasarca, and abdominal pain. He was admitted on 09/07/19 and being medically managed for Acute CHF exacerbation, abdominal pain and chronic conditions.    Acute Diastolic (Congestive) Heart Failure ([place]) He has a history of HPpEF (EF 60-65%) and presented with SOB, anasarca and significant right lower extremity edema.  ED labs showed BNP of 493 and CXR noted for pulmonary edema consistent with hypervolemia. He received IV lasix and metolazone with electrolyte monitoring. He has had a significant increase in output since admission (-21L). Admission weigh 164.6 kg and diuresed to 151 kg. He reports that his dyspnea and edema has improved. Will discharge home on Lasix  -Resume on home lasix 160 mg am, 80 mg pm, metolazone 2.5 mg every other day -Oxycodone 10 mg PRN for pain  Abdominal Pain On this admission he presented abdominal distension, nauesa and vomiting.He stated he has not had a bowel movement since 09/02/19. Abdominal pain likely due to a combination of edema from heart failure, constipation, and GERD. CT did not show any acute process in abdomen or pelvis. He had difficulty adhering to Miralax. Senna, and simethicone due to nausea and vomiting. Nausea treated with IV phenergan and Protonix. Place on bisacodyl suppositories and lactulose. He subsequently 2 small BM on 09/10/2019 and a large bowel movement on 09/13/2019 with improvement of abdominal pain and resolution of nausea and vomiting. The patient was concerned about the presence of calcified gallstones without cholecystitis on his CT abdomen pelvis. Consulted general surgery regarding this and reiterated that with the pandemic all elective procedure all elective procedures have been put on hold.  - Discharged with limited prescription for Zofran -  Counseled regarding OTC bowel regimen   Chronic Kidney Disease- Stage 4 AKI, resolved His creatinine has improved from 4.67 on admission to 3.31 which is his baseline. He likely had an AKI due to extravascular hypervolemia. We ordered IV diuresis and monitored his renal function for continued improvement. Cr downtrended with diuresis to 3.54 with increase to 4.2 on day 5 to 3.31 after discontinuing diuresis and GFR or 25. -will need follow up with nephrology  Insulin Dependent Type 2 Diabetes Mellitus Home regimen victoza 1.2mg  daily, tresiba 96u in AM, novolog 20u breakfast, 25u lunch, 30u dinner. We held his home meds and titrated prandial and basal insulin as needed.    Hypertension Currently taking Carvedilol, hydralazine, clonidine at home. We continued him on his home meds.   Recurrent Cystitis Follows w/ [facility] for hx of recurrent UTIs. Takes prophylactic Bactrim. Received 10 days of cephalosporin therapy on last admission. Bactrim held this admission in the setting of SKI UA negative for UTI, and pt has not endorsed urinary symptoms. Low suspicion for current infection. - Will need follow up with his urologist at [facility]  History of DVT/PE He received prophylactic anticoagulation with Eliquis 5mg 

## 2019-09-11 ENCOUNTER — Encounter (HOSPITAL_COMMUNITY): Payer: Medicare Other

## 2019-09-11 LAB — COMPREHENSIVE METABOLIC PANEL
ALT: 25 U/L (ref 0–44)
AST: 19 U/L (ref 15–41)
Albumin: 3 g/dL — ABNORMAL LOW (ref 3.5–5.0)
Alkaline Phosphatase: 98 U/L (ref 38–126)
Anion gap: 14 (ref 5–15)
BUN: 103 mg/dL — ABNORMAL HIGH (ref 6–20)
CO2: 26 mmol/L (ref 22–32)
Calcium: 9.9 mg/dL (ref 8.9–10.3)
Chloride: 96 mmol/L — ABNORMAL LOW (ref 98–111)
Creatinine, Ser: 3.54 mg/dL — ABNORMAL HIGH (ref 0.61–1.24)
GFR calc Af Amer: 23 mL/min — ABNORMAL LOW (ref >60)
GFR calc non Af Amer: 20 mL/min — ABNORMAL LOW (ref >60)
Glucose, Bld: 262 mg/dL — ABNORMAL HIGH (ref 70–99)
Potassium: 3.6 mmol/L (ref 3.5–5.1)
Sodium: 136 mmol/L (ref 135–145)
Total Bilirubin: 1 mg/dL (ref 0.3–1.2)
Total Protein: 7.5 g/dL (ref 6.5–8.1)

## 2019-09-11 LAB — GLUCOSE, CAPILLARY
Glucose-Capillary: 243 mg/dL — ABNORMAL HIGH (ref 70–99)
Glucose-Capillary: 235 mg/dL — ABNORMAL HIGH (ref 70–99)
Glucose-Capillary: 202 mg/dL — ABNORMAL HIGH (ref 70–99)
Glucose-Capillary: 229 mg/dL — ABNORMAL HIGH (ref 70–99)

## 2019-09-11 LAB — CBC
HCT: 32.5 % — ABNORMAL LOW (ref 39.0–52.0)
Hemoglobin: 10.1 g/dL — ABNORMAL LOW (ref 13.0–17.0)
MCH: 27.4 pg (ref 26.0–34.0)
MCHC: 31.1 g/dL (ref 30.0–36.0)
MCV: 88.1 fL (ref 80.0–100.0)
Platelets: 546 10*3/uL — ABNORMAL HIGH (ref 150–400)
RBC: 3.69 MIL/uL — ABNORMAL LOW (ref 4.22–5.81)
RDW: 15 % (ref 11.5–15.5)
WBC: 11 10*3/uL — ABNORMAL HIGH (ref 4.0–10.5)
nRBC: 0 % (ref 0.0–0.2)

## 2019-09-11 LAB — TSH: TSH: 1.089 u[IU]/mL (ref 0.350–4.500)

## 2019-09-11 MED ORDER — HYDROMORPHONE HCL 1 MG/ML IJ SOLN
0.5000 mg | INTRAMUSCULAR | Status: DC | PRN
  Administered 2019-09-11 – 2019-09-14 (×6): 0.5 mg via INTRAVENOUS
  Filled 2019-09-11 (×6): qty 1

## 2019-09-11 MED ORDER — PANTOPRAZOLE SODIUM 40 MG IV SOLR
40.0000 mg | Freq: Every day | INTRAVENOUS | Status: DC
  Administered 2019-09-11 – 2019-09-13 (×3): 40 mg via INTRAVENOUS
  Filled 2019-09-11 (×3): qty 40

## 2019-09-11 MED ORDER — INSULIN ASPART 100 UNIT/ML ~~LOC~~ SOLN
12.0000 [IU] | Freq: Three times a day (TID) | SUBCUTANEOUS | Status: DC
  Administered 2019-09-11 – 2019-09-12 (×3): 12 [IU] via SUBCUTANEOUS

## 2019-09-11 MED ORDER — PROMETHAZINE HCL 25 MG/ML IJ SOLN
12.5000 mg | Freq: Four times a day (QID) | INTRAMUSCULAR | Status: DC | PRN
  Administered 2019-09-11 – 2019-09-13 (×3): 12.5 mg via INTRAVENOUS
  Filled 2019-09-11 (×3): qty 1

## 2019-09-11 MED ORDER — INSULIN GLARGINE 100 UNIT/ML ~~LOC~~ SOLN
30.0000 [IU] | Freq: Every day | SUBCUTANEOUS | Status: DC
  Administered 2019-09-11 – 2019-09-13 (×3): 30 [IU] via SUBCUTANEOUS
  Filled 2019-09-11 (×4): qty 0.3

## 2019-09-11 MED ORDER — BISACODYL 10 MG RE SUPP
10.0000 mg | Freq: Once | RECTAL | Status: AC
  Administered 2019-09-11: 10 mg via RECTAL
  Filled 2019-09-11: qty 1

## 2019-09-11 NOTE — Progress Notes (Signed)
Mobility Specialist: Progress Note    09/11/19 1753  Mobility  Activity Dangled on edge of bed (Leg Extensions)  Level of Assistance Independent  Assistive Device None  Mobility Response Tolerated well  Mobility performed by Mobility specialist  $Mobility charge 1 Mobility   Pre-Mobility: 88 HR Post-Mobility: 95 HR  Pt performed 3 sets of 10 leg extensions on his L leg on EOB. Pt is agreeable to work in wheelchair tomorrow in the hall.   [facility] Rashana Andrew Mobility Specialist

## 2019-09-11 NOTE — Progress Notes (Signed)
PT Cancellation Note  Patient Details Name: [name] MRN: [id number] DOB: 1975-11-11   Cancelled Treatment:    Reason Eval/Treat Not Completed: Patient declined, no reason specified. PT attempts to see pt twice for treatment on this date with pt refusing twice, initially because he had not received breakfast and his morning meds, and on second attempt because he had a suppository in. PT offered to assist in transfer to bedside commode however the pt continued to decline. PT informed the patient that this was his 3rd consecutive day of refusal and that PT typically signs off. Pt requests one more attempt tomorrow. PT will return tomorrow, if the pt refuses for any reason PT will sign off.   Zenaida Niece 09/11/2019, 3:02 PM

## 2019-09-11 NOTE — Consult Note (Signed)
   [facility] CM Inpatient Consult   09/11/2019  [name] [birth year] [id number]  [facility] [facility] [ACO] Patient: Medicare NextGen and active with [facility] Embedded Chronic Care Management  Patient was screened for extreme high risk and less than 7 days readmission.  Patient will have the transition of care call conducted by the primary care provider. This patient is also in an Embedded practice which has a chronic disease management Embedded Care Management team.  Plan:  Will notify and update[facility] Embedded Care Management team of readmission and make aware of any additional follow up needs. Will follow for transitional needs.  Please contact for further questions,  [name], RN BSN [facility] Liaison  [phone] business mobile phone Toll free office [phone]  Fax number: [phone] [email or web address]

## 2019-09-11 NOTE — Progress Notes (Signed)
Pt refusing cpap for the night, RT will continue to monitor as needed. 

## 2019-09-11 NOTE — Progress Notes (Signed)
Subjective:  [name] had 2 BM overnight. He reports that he is still experiencing nausea and had a few episodes of watery emesis. He reports that he is unable to keep solid foods down and would like to remain on a liquid diet. He is currently breathing of room air and still experiencing LLE edema.   He is also concerned about his abdominal pain in relation to the cholelithiasis that was found on previous CT, and discussed consulting with General Surgery about future surgical plans.   Objective:  Vital signs in last 24 hours: Vitals:   09/11/19 0037 09/11/19 0403 09/11/19 0754 09/11/19 0755  BP: (!) 144/74 (!) 165/79  (!) 171/89  Pulse: 99 97  98  Resp: 19 17    Temp: (!) 97.4 F (36.3 C) 98.3 F (36.8 C) 98 F (36.7 C) 98 F (36.7 C)  TempSrc: Oral Oral Oral Oral  SpO2: 93% 93% 96%   Weight:  (!) 155.8 kg     Weight change: -7.7 kg  Intake/Output Summary (Last 24 hours) at 09/11/2019 1037 Last data filed at 09/11/2019 0700 Gross per 24 hour  Intake 360 ml  Output 4350 ml  Net -3990 ml   General Appearance: Awake, sitting in bed, Obese habitus, NAD, dry-heaving  Lungs: Clear to auscultation bilaterally, respirations unlabored, breathing on room air Heart: Regular rate and rhythm, S1 and S2 normal, no murmur, rub or gallop  Abdomen: Mildly distended  Extremities: Right BKA, 3+ pitting edema in LLE with tenderness to palpation. No erythema or warmth or lesions.  Skin: Warm and Dry  Psych: Frustrated. Congruent mood and affect  Neurologic: Spontaneous moves extremities. No focal neural deficits.   Assessment/Plan:  Active Problems:   Insulin dependent type 2 diabetes mellitus, controlled (HCC)   History of DVT (deep vein thrombosis)   Hypertension   Constipation   Leg swelling   CKD (chronic kidney disease)   Abdominal pain   Acute diastolic (congestive) heart failure (HCC)   Acute Diastolic (Congestive) Heart Failure ([place]) Improving Patient with HPpEF (EF  60-65%) presented with SOB, anasarca and significant right lower extremity edema.  ED labs showed BNP of 493 and CXR noted for pulmonary edema consistent with hypervolemia. Receiving IV diuretics and is currently respirating on room air.  He reports that his dyspnea or edema has not changed. He has had a significant increase in output since admission (net output: -13L). We will continue to diuresis and closely  monitor electrolytes.  --Continue supplemental O2 with goal  --continue Metolazone 2.5mg  daily --continue IV furosemide 120mg  BID --Strict I/O --Fluid Restrictio --Daily weights --monitor electrolytes --Replenish K+ prn     Chronic Kidney Disease- Stage 4 AKI, improving His creatinine been trending downward since admission (4.68 to 3.54). He likely had AKI due to extravascular hypervolemia. We will continue with IV diuresis and monitoring his renal function for continued improvement. -see above   Insulin Dependent Type 2 Diabetes Mellitus CBGs have been continued to be elvated. We are holding home meds. With recommendation from Diabetes Coordinator and Pharmacy, we will increase basal insulin to 30 units, and short acting 12 units and continuing on SSI.  -- Diabetes coordinator following, appreciate recs --Lantus 30 units QHS --Insulin Aspart 12 units  --SSI      Hypertension Currently taking Carvedilol, hydralazine, clonidine at home. BP has been trending 150s-140s/80s-70s over the last 24hrs --continue on home meds     Abdominal Pain He reports abdominal distension, nauesa and vomiting. His symptoms are  most likely to due to constipation. After no success with Miralax, Senna or NuLYTLEY, We recommended adding Lactulose and Bisacodyl suppository. We discussed the importance of consistency with treatment. He has had 2 BMs since overnight, so we will continue regimen. He is experiencing worsening nausea so we will also start promethazine. Adding Dilaudid for pain  control --Bisacodyl Suppository 10mg  --Lactulose solution TID --Zofran prn for nausea   --Simethicone tablet 80mg   --Promethazine (phenergan) injection 12.mg Q6H prn   --Dilaudid .5mg  injection QID   History of DVT/PE Prophylactic anticoagulation  --continue Eliquis 5mg     Constipation After Medicine Requisition, it was discovered that the patient was being administered Oxybutynin incorrectly. He was administered 25mg  TID when prescribed 5mg TID while inpatient. It was last administered on 09/09/19. The anti-cholinergic effects could have been contributing to his constipation. It is now discontinued. --d/c Oxybutynin     LOS: 4 days   Jacklynn Bue, Medical Student 09/11/2019, 10:37 AM

## 2019-09-11 NOTE — Consult Note (Signed)
[name] 1975-03-24  [id number].    Requesting MD: Dr. Jacklynn Bue Chief Complaint/Reason for Consult: Gallstones, abdominal pain  HPI: [name] is a 44 y.o. male with a history of DM, DVT/PE on Eliquis, OSA, HTN, HLD, GERD, CHF, prior R AKA and CKD who presented with SOB and abdominal pain.   Patient reports over the last several weeks he has had constant, severe, 10/10 generalized abdominal pain that is worsening lower abdomen, specifically the suprapubic abdomen. He reports that nothing makes the symptoms worse. He reports associated chills, decreased appetite, nausea, emesis, and constipation.  Patient reports that he is only able to tolerate clear liquids.  He reports otherwise he develops nausea and emesis shortly after eating.  He denies any worsening abdominal pain with eating.  Patient notes that he suffered from constipation.  Prior to yesterday he had not had a bowel movement since 8/24.  He has been on bowel regimen here and he is starting to pass more flatus as well as had 2 BMs yesterday.  He did not specify if this helped with his symptoms. Patient reports similar symptoms in the past that were less severe. He did have an admission in 2018 for intractable n/v.  He had a negative endoscopy 11/02/2016. He had a gastric emptying study on 12/05/2016 that showed mild delayed gastric emptying.   Patient presented to the ED on 8/29 with sob, cough, generalized abdominal pain, and intolerance to eating w/ n/v. Patient was found to have a heart failure exacerbation and was admitted to the internal medicine program. He did have a CT on admission that showed no acute process in the abdomen/pelvis.  He did show cholelithiasis without acute cholecystitis.  He also showed moderate groundglass opacities/consolidations on the lung bases likely reflecting pneumonia. LFTs have remained wnl since admission. WBC was normal on admission and was noted to be elevated at 11.0 on today's labs. We  were asked to see.    ROS: Review of Systems  Constitutional: Positive for chills. Negative for fever.  Respiratory: Positive for cough and shortness of breath.   Cardiovascular: Positive for leg swelling. Negative for chest pain.  Gastrointestinal: Positive for abdominal pain, constipation, nausea and vomiting.  Musculoskeletal: Negative for back pain.  Psychiatric/Behavioral: Negative for substance abuse.  All other systems reviewed and are negative.   Family History  Problem Relation Age of Onset  . Heart disease Mother   . Diabetes Mother   . Diabetes Maternal Uncle   . Diabetes Cousin     Past Medical History:  Diagnosis Date  . Acute venous embolism and thrombosis of deep vessels of proximal lower extremity ([place]) 07/19/2011  . Anemia   . Anginal pain ([place])    pt denies  . Anxiety   . Chest pain, neg MI, normal coronaries by cath 02/18/2013   pt denies  . CHF (congestive heart failure) ([place])   . CKD (chronic kidney disease) stage 3, GFR 30-59 ml/min 02/19/2013  . Colon polyps   . Depression   . Diabetic ulcer of right foot ([place])   . DVT (deep venous thrombosis) ([place]) 09/2002   patient reports additional DVTs in '06 & '11 (unconfirmed)  . ED (erectile dysfunction)   . GERD (gastroesophageal reflux disease)   . History of blood transfusion    "related to OR" (10/31/2016)  . Hyperlipidemia 02/19/2013  . Hypertension   . Nausea & vomiting    "constant for the last couple weeks" (10/31/2016)  . Nephrotic syndrome   .  Obesity    BMI 44, weight 346 pounds 01/30/14  . OSA (obstructive sleep apnea) 02/28/2018   Mild obstructive sleep apnea with an AHI of 9.8/h but severe during REM sleep with an AHI of 33.8/h.  Oxygen saturations dropped to 86% and there was moderate snoring  . Peripheral edema   . Pneumonia   . Prosthesis adjustment 08/17/2016  . Pulmonary embolism ([place]) 09/2002   treated with 6 months of warfarin  . Pyelonephritis 02/02/2018  . Type I diabetes mellitus  ([place]) dx'd 2001  . Urinary retention     Past Surgical History:  Procedure Laterality Date  . AMPUTATION Right 12/22/2013   Procedure: AMPUTATION BELOW KNEE;  Surgeon: [name], MD;  Location: [place];  Service: Orthopedics;  Laterality: Right;  . AMPUTATION Right 02/19/2014   Procedure: RIGHT ABOVE KNEE AMPUTATION ;  Surgeon: [name], MD;  Location: [place];  Service: Orthopedics;  Laterality: Right;  . blood clot removal  2016  . CARDIAC CATHETERIZATION  02/18/2013   normal coronaries  . ESOPHAGOGASTRODUODENOSCOPY N/A 11/02/2016   Procedure: ESOPHAGOGASTRODUODENOSCOPY (EGD);  Surgeon: [name], MD;  Location: [facility] ENDOSCOPY;  Service: Endoscopy;  Laterality: N/A;  . I & D EXTREMITY Right 12/14/2013   Procedure: IRRIGATION AND DEBRIDEMENT RIGHT FOOT;  Surgeon: [name], MD;  Location: [place];  Service: Orthopedics;  Laterality: Right;  . INCISION AND DRAINAGE ABSCESS  2007; 2015   "back"  . INCISION AND DRAINAGE OF WOUND Right 12/22/2013   Procedure: I&D RIGHT BUTTOCK;  Surgeon: [name], MD;  Location: [place];  Service: Orthopedics;  Laterality: Right;  . LEFT HEART CATHETERIZATION WITH CORONARY ANGIOGRAM N/A 02/18/2013   Procedure: LEFT HEART CATHETERIZATION WITH CORONARY ANGIOGRAM;  Surgeon: [name], MD;  Location: [facility] CATH LAB;  Service: Cardiovascular;  Laterality: N/A;    Social History:  reports that he has never smoked. He has never used smokeless tobacco. He reports current alcohol use. He reports that he does not use drugs. Lives at home alone No tobacco use No drug use No alcohol use On disability. Goes to school for communications at [employer]  Allergies:  Allergies  Allergen Reactions  . Reglan [Metoclopramide] Other (See Comments)    Dysphoric reaction  . Metformin And Related Diarrhea  . Ozempic (0.25 Or 0.5 Mg-Dose) [Semaglutide(0.25 Or 0.22m-Dos)] Nausea And Vomiting    Medications Prior to Admission  Medication Sig Dispense Refill  .  acidophilus (RISAQUAD) CAPS capsule Take 1 capsule by mouth daily.    .Marland Kitchenallopurinol (ZYLOPRIM) 100 MG tablet Take 2 tablets (200 mg total) by mouth daily. 180 tablet 3  . Alpha-Lipoic Acid 600 MG CAPS Take 600 mg by mouth daily.    .Marland KitchenamLODipine (NORVASC) 10 MG tablet Take 10 mg by mouth at bedtime.    . calcitRIOL (ROCALTROL) 0.5 MCG capsule Take 2.5 mcg by mouth daily.     . carvedilol (COREG) 25 MG tablet Take 1 tablet (25 mg total) by mouth 2 (two) times daily with a meal. 180 tablet 1  . cloNIDine (CATAPRES) 0.3 MG tablet Take 1 tablet (0.3 mg total) by mouth 2 (two) times daily. 180 tablet 1  . Cyanocobalamin (B-12) 500 MCG SUBL Place 500 mcg under the tongue daily.     .Marland KitchenELIQUIS 5 MG TABS tablet Take 1 tablet by mouth twice daily (Patient taking differently: Take 5 mg by mouth 2 (two) times daily. ) 180 tablet 0  . famotidine (PEPCID) 20 MG tablet Take 1  tablet (20 mg total) by mouth 2 (two) times daily. (Patient taking differently: Take 20 mg by mouth 2 (two) times daily as needed for heartburn or indigestion. ) 30 tablet 0  . furosemide (LASIX) 80 MG tablet Take 1 tablet (80 mg total) by mouth 2 (two) times daily. (Patient taking differently: Take 80-160 mg by mouth 2 (two) times daily. 135m in the morning and 885min the afternoon.) 180 tablet 0  . gabapentin (NEURONTIN) 100 MG capsule Take 1 capsule (100 mg total) by mouth 3 (three) times daily. 270 capsule 1  . hydrALAZINE (APRESOLINE) 50 MG tablet Take 1 tablet by mouth 4 times daily (Patient taking differently: Take 50 mg by mouth 4 (four) times daily. ) 360 tablet 0  . insulin aspart (NOVOLOG) 100 UNIT/ML FlexPen Inject 25 Units into the skin 3 (three) times daily with meals. 20 unit morning, 25 units lunch 30 units for dinner (Patient taking differently: Inject 20-30 Units into the skin See admin instructions. 20 units in the morning, 25 units in the afternoon and 30 units before bedtime Note: Doesn't use on mornings when he works out.)  8 pen 11  . lovastatin (MEVACOR) 20 MG tablet Take 1 tablet by mouth once daily (Patient taking differently: Take 20 mg by mouth at bedtime. ) 90 tablet 0  . Melatonin 10 MG TABS Take 10 mg by mouth at bedtime as needed (sleep).    . metolazone (ZAROXOLYN) 2.5 MG tablet Take 2.5 mg by mouth every other day.    . Omega-3 Fatty Acids (FISH OIL) 1000 MG CAPS Take 1,000 mg by mouth at bedtime.     . ondansetron (ZOFRAN) 4 MG tablet Take 1 tablet (4 mg total) by mouth every 6 (six) hours as needed for nausea. 20 tablet 0  . oxybutynin (DITROPAN) 5 MG tablet Take 5 mg by mouth 3 (three) times daily.     . [EXPIRED] oxyCODONE 10 MG TABS Take 1 tablet (10 mg total) by mouth every 6 (six) hours as needed for up to 5 days for severe pain. 20 tablet 0  . polyethylene glycol (MIRALAX / GLYCOLAX) 17 g packet Take 17 g by mouth 2 (two) times daily. 14 each 0  . senna-docusate (SENOKOT-S) 8.6-50 MG tablet Take 2 tablets by mouth 2 (two) times daily. 60 tablet 0  . tamsulosin (FLOMAX) 0.4 MG CAPS capsule Take 0.4 mg by mouth at bedtime.     . TRESIBA FLEXTOUCH 200 UNIT/ML FlexTouch Pen INJECT 70 UNITS SUBCUTANEOUSLY ONCE DAILY (Patient taking differently: Inject 96 Units into the skin in the morning. ) 45 mL 1  . VICTOZA 18 MG/3ML SOPN INJECT 1.2 MG (0.2 MLS) INTO THE SKIN ONCE DAILY AT THE SAME TIME. (Patient taking differently: Inject 1.2 mg into the skin See admin instructions. INJECT 1.2 MG (0.2 MLS) INTO THE SKIN ONCE DAILY AT THE SAME TIME.) 6 mL 0  . Accu-Chek Softclix Lancets lancets Check blood sugar up to 3 times a day (Patient taking differently: 1 each by Other route See admin instructions. Check blood sugar up to 4-5 times a day) 300 each 3  . Blood Glucose Monitoring Suppl (ACCU-CHEK GUIDE ME) w/Device KIT Use to check blood sugar up to 3 times a day (Patient taking differently: 1 each by Other route See admin instructions. Use to check blood sugar up to 4-5 times a day) 1 kit 1  . glucose blood  (ACCU-CHEK GUIDE) test strip Check blood sugar up to 3 times a day (Patient  taking differently: 1 each by Other route See admin instructions. Check blood sugar up to 4-5 times a day) 300 each 3  . Insulin Pen Needle (B-D UF III MINI PEN NEEDLES) 31G X 5 MM MISC USE  4 TIMES DAILY (Patient taking differently: 1 each by Other route See admin instructions. USE 4-5 TIMES DAILY) 200 each 7  . Insulin Pen Needle (PEN NEEDLES 3/16") 31G X 5 MM MISC Use five times daily (Patient taking differently: 1 each by Other route See admin instructions. Use five times daily) 390 each 3  . Lancet Devices (ACCU-CHEK SOFTCLIX) lancets Use to check blood sugars 5 times a day. Dx code:E11.9. Insulin dependent. (Patient taking differently: 1 each by Other route See admin instructions. Use to check blood sugars 5 times a day. Dx code:E11.9. Insulin dependent.) 6 each 3     Physical Exam: Blood pressure (!) 171/89, pulse 98, temperature 98.1 F (36.7 C), temperature source Oral, resp. rate 16, weight (!) 155.8 kg, SpO2 96 %. General: pleasant, obese male who is laying in bed in NAD HEENT: head is normocephalic, atraumatic.  Sclera are noninjected.  PERRL.  Ears and nose without any masses or lesions.  Mouth is pink and moist. Dentition fair Heart: regular, rate, and rhythm.  Normal s1,s2. No obvious murmurs, gallops, or rubs noted.  Palpable radial pulses bilaterally  Lungs: CTAB, no wheezes, rhonchi, or rales noted.  Respiratory effort nonlabored Abd: Soft, obese, ND, he reports generalized tenderness but only ilicits suprapubic tenderness with palpation. Negative Murphy's sign. +BS, no masses, hernias, or organomegaly MS: Right AKA. Left lower extremity edema with tenderness of the calf and foot. Moves UE's.  Skin: warm and dry with no masses, lesions, or rashes Psych: A&Ox4 with an appropriate affect Neuro: cranial nerves grossly intact, moves all extremities, equal strength in BUE, normal speech, though process intact.  Unable to assess gait.    Results for orders placed or performed during the hospital encounter of 09/07/19 (from the past 48 hour(s))  Glucose, capillary     Status: Abnormal   Collection Time: 09/09/19  3:08 PM  Result Value Ref Range   Glucose-Capillary 217 (H) 70 - 99 mg/dL    Comment: Glucose reference range applies only to samples taken after fasting for at least 8 hours.  Glucose, capillary     Status: Abnormal   Collection Time: 09/09/19  6:19 PM  Result Value Ref Range   Glucose-Capillary 295 (H) 70 - 99 mg/dL    Comment: Glucose reference range applies only to samples taken after fasting for at least 8 hours.  Glucose, capillary     Status: Abnormal   Collection Time: 09/09/19 10:15 PM  Result Value Ref Range   Glucose-Capillary 316 (H) 70 - 99 mg/dL    Comment: Glucose reference range applies only to samples taken after fasting for at least 8 hours.  CBC     Status: Abnormal   Collection Time: 09/10/19  5:48 AM  Result Value Ref Range   WBC 9.9 4.0 - 10.5 K/uL   RBC 3.44 (L) 4.22 - 5.81 MIL/uL   Hemoglobin 9.3 (L) 13.0 - 17.0 g/dL   HCT 30.2 (L) 39 - 52 %   MCV 87.8 80.0 - 100.0 fL   MCH 27.0 26.0 - 34.0 pg   MCHC 30.8 30.0 - 36.0 g/dL   RDW 15.0 11.5 - 15.5 %   Platelets 467 (H) 150 - 400 K/uL   nRBC 0.0 0.0 - 0.2 %  Comment: Performed at [facility], [place] [address] [place], [place] [address]  Comprehensive metabolic panel     Status: Abnormal   Collection Time: 09/10/19  5:48 AM  Result Value Ref Range   Sodium 136 135 - 145 mmol/L   Potassium 3.8 3.5 - 5.1 mmol/L   Chloride 98 98 - 111 mmol/L   CO2 23 22 - 32 mmol/L   Glucose, Bld 264 (H) 70 - 99 mg/dL    Comment: Glucose reference range applies only to samples taken after fasting for at least 8 hours.   BUN 110 (H) 6 - 20 mg/dL   Creatinine, Ser 3.91 (H) 0.61 - 1.24 mg/dL   Calcium 9.6 8.9 - 10.3 mg/dL   Total Protein 7.7 6.5 - 8.1 g/dL   Albumin 2.9 (L) 3.5 - 5.0 g/dL   AST 18 15 - 41 U/L   ALT  23 0 - 44 U/L   Alkaline Phosphatase 107 38 - 126 U/L   Total Bilirubin 0.4 0.3 - 1.2 mg/dL   GFR calc non Af Amer 18 (L) >60 mL/min   GFR calc Af Amer 21 (L) >60 mL/min   Anion gap 15 5 - 15    Comment: Performed at [facility] [address], [place], [place] [address]  Glucose, capillary     Status: Abnormal   Collection Time: 09/10/19  6:09 AM  Result Value Ref Range   Glucose-Capillary 245 (H) 70 - 99 mg/dL    Comment: Glucose reference range applies only to samples taken after fasting for at least 8 hours.  Glucose, capillary     Status: Abnormal   Collection Time: 09/10/19 10:48 AM  Result Value Ref Range   Glucose-Capillary 285 (H) 70 - 99 mg/dL    Comment: Glucose reference range applies only to samples taken after fasting for at least 8 hours.  Glucose, capillary     Status: Abnormal   Collection Time: 09/10/19  4:49 PM  Result Value Ref Range   Glucose-Capillary 349 (H) 70 - 99 mg/dL    Comment: Glucose reference range applies only to samples taken after fasting for at least 8 hours.  Glucose, capillary     Status: Abnormal   Collection Time: 09/10/19  9:43 PM  Result Value Ref Range   Glucose-Capillary 243 (H) 70 - 99 mg/dL    Comment: Glucose reference range applies only to samples taken after fasting for at least 8 hours.  TSH     Status: None   Collection Time: 09/11/19  2:07 AM  Result Value Ref Range   TSH 1.089 0.350 - 4.500 uIU/mL    Comment: Performed by a 3rd Generation assay with a functional sensitivity of <=0.01 uIU/mL. Performed at [facility], [place] [address] [place], [place] [address]   CBC     Status: Abnormal   Collection Time: 09/11/19  2:07 AM  Result Value Ref Range   WBC 11.0 (H) 4.0 - 10.5 K/uL   RBC 3.69 (L) 4.22 - 5.81 MIL/uL   Hemoglobin 10.1 (L) 13.0 - 17.0 g/dL   HCT 32.5 (L) 39 - 52 %   MCV 88.1 80.0 - 100.0 fL   MCH 27.4 26.0 - 34.0 pg   MCHC 31.1 30.0 - 36.0 g/dL   RDW 15.0 11.5 - 15.5 %   Platelets 546 (H) 150 -  400 K/uL   nRBC 0.0 0.0 - 0.2 %    Comment: Performed at [facility],  [address], [place],  [address]  Comprehensive metabolic panel     Status: Abnormal   Collection Time: 09/11/19  2:07 AM  Result Value Ref Range   Sodium 136 135 - 145 mmol/L   Potassium 3.6 3.5 - 5.1 mmol/L   Chloride 96 (L) 98 - 111 mmol/L   CO2 26 22 - 32 mmol/L   Glucose, Bld 262 (H) 70 - 99 mg/dL    Comment: Glucose reference range applies only to samples taken after fasting for at least 8 hours.   BUN 103 (H) 6 - 20 mg/dL   Creatinine, Ser 3.54 (H) 0.61 - 1.24 mg/dL   Calcium 9.9 8.9 - 10.3 mg/dL   Total Protein 7.5 6.5 - 8.1 g/dL   Albumin 3.0 (L) 3.5 - 5.0 g/dL   AST 19 15 - 41 U/L   ALT 25 0 - 44 U/L   Alkaline Phosphatase 98 38 - 126 U/L   Total Bilirubin 1.0 0.3 - 1.2 mg/dL   GFR calc non Af Amer 20 (L) >60 mL/min   GFR calc Af Amer 23 (L) >60 mL/min   Anion gap 14 5 - 15    Comment: Performed at [facility] [address] [place], [place] [address]  Glucose, capillary     Status: Abnormal   Collection Time: 09/11/19  6:08 AM  Result Value Ref Range   Glucose-Capillary 243 (H) 70 - 99 mg/dL    Comment: Glucose reference range applies only to samples taken after fasting for at least 8 hours.  Glucose, capillary     Status: Abnormal   Collection Time: 09/11/19 11:26 AM  Result Value Ref Range   Glucose-Capillary 235 (H) 70 - 99 mg/dL    Comment: Glucose reference range applies only to samples taken after fasting for at least 8 hours.   No results found.    Assessment/Plan DM Hx of DVT/PE on Eliquis OSA HTN HLD GERD Acute on chronic CHF - getting aggressively diuresed  Prior R AKA  CKD Constipation  Possible PNA on CT - not currently on abx Intractable n/v - Unknown etiology. He did have an admission in 2018 for intractable n/v as well. He had a negative endoscopy 11/02/2016. He had a gastric emptying study on 12/05/2016 that showed mild delayed gastric  emptying. Needs further workup from primary team to evaluate this. Given he had some delayed gastric emptying on imaging ~3 years ago, possible it could be from Diabetic gastroparesis?  - Per primary team -   Cholelithiasis  -Patient without evidence of acute cholecystitis on imaging or exam. LFTs within normal limits.  No indication for emergency surgery. Defer to primary team for further workup of intractable n/v as I mentioned above. Thank you for the consult. Call back with any questions or concerns.   Jillyn Ledger, [facility] 09/11/2019, 1:57 PM Please see Amion for pager number during day hours 7:00am-4:30pm

## 2019-09-12 DIAGNOSIS — K3184 Gastroparesis: Secondary | ICD-10-CM

## 2019-09-12 LAB — GLUCOSE, CAPILLARY
Glucose-Capillary: 229 mg/dL — ABNORMAL HIGH (ref 70–99)
Glucose-Capillary: 222 mg/dL — ABNORMAL HIGH (ref 70–99)
Glucose-Capillary: 235 mg/dL — ABNORMAL HIGH (ref 70–99)
Glucose-Capillary: 250 mg/dL — ABNORMAL HIGH (ref 70–99)

## 2019-09-12 LAB — CBC
HCT: 32 % — ABNORMAL LOW (ref 39.0–52.0)
Hemoglobin: 9.6 g/dL — ABNORMAL LOW (ref 13.0–17.0)
MCH: 26.7 pg (ref 26.0–34.0)
MCHC: 30 g/dL (ref 30.0–36.0)
MCV: 88.9 fL (ref 80.0–100.0)
Platelets: 474 10*3/uL — ABNORMAL HIGH (ref 150–400)
RBC: 3.6 MIL/uL — ABNORMAL LOW (ref 4.22–5.81)
RDW: 15 % (ref 11.5–15.5)
WBC: 10.4 10*3/uL (ref 4.0–10.5)
nRBC: 0 % (ref 0.0–0.2)

## 2019-09-12 LAB — BASIC METABOLIC PANEL
Anion gap: 12 (ref 5–15)
BUN: 99 mg/dL — ABNORMAL HIGH (ref 6–20)
CO2: 27 mmol/L (ref 22–32)
Calcium: 9.5 mg/dL (ref 8.9–10.3)
Chloride: 92 mmol/L — ABNORMAL LOW (ref 98–111)
Creatinine, Ser: 4.28 mg/dL — ABNORMAL HIGH (ref 0.61–1.24)
GFR calc Af Amer: 18 mL/min — ABNORMAL LOW (ref >60)
GFR calc non Af Amer: 16 mL/min — ABNORMAL LOW (ref >60)
Glucose, Bld: 266 mg/dL — ABNORMAL HIGH (ref 70–99)
Potassium: 3.4 mmol/L — ABNORMAL LOW (ref 3.5–5.1)
Sodium: 131 mmol/L — ABNORMAL LOW (ref 135–145)

## 2019-09-12 LAB — CULTURE, BLOOD (ROUTINE X 2)
Culture: NO GROWTH
Culture: NO GROWTH
Special Requests: ADEQUATE
Special Requests: ADEQUATE

## 2019-09-12 MED ORDER — BISACODYL 10 MG RE SUPP
10.0000 mg | Freq: Once | RECTAL | Status: DC
  Filled 2019-09-12: qty 1

## 2019-09-12 MED ORDER — INSULIN ASPART 100 UNIT/ML ~~LOC~~ SOLN
10.0000 [IU] | Freq: Three times a day (TID) | SUBCUTANEOUS | Status: DC
  Administered 2019-09-12 – 2019-09-14 (×7): 10 [IU] via SUBCUTANEOUS

## 2019-09-12 MED ORDER — INSULIN GLARGINE 100 UNIT/ML ~~LOC~~ SOLN
5.0000 [IU] | Freq: Once | SUBCUTANEOUS | Status: AC
  Administered 2019-09-12: 5 [IU] via SUBCUTANEOUS
  Filled 2019-09-12 (×2): qty 0.05

## 2019-09-12 NOTE — Progress Notes (Signed)
Subjective:  [name] reports that he is still having abdominal pain and he has not been able to have another BM since 09/10/19 and would to continue with Lactulose and Bisacodyl suppository. He reports that he is still having nausea but was able to keep some dinner down and is amenable to starting a soft food diet.   He also reports that he is breathing comfortably on room air and hasn't noticed a decrease in his LLE, but would like to try compression stockings.  Objective:  Vital signs in last 24 hours: Vitals:   09/11/19 2357 09/12/19 0413 09/12/19 0419 09/12/19 0856  BP:  (!) 159/79  131/75  Pulse: 84 86  85  Resp: 14 20 13 13   Temp:  97.8 F (36.6 C)  98.4 F (36.9 C)  TempSrc:  Oral  Oral  SpO2:  97%  96%  Weight:   (!) 150.8 kg    Weight change: -4.979 kg  Intake/Output Summary (Last 24 hours) at 09/12/2019 1016 Last data filed at 09/12/2019 0844 Gross per 24 hour  Intake 942 ml  Output 2300 ml  Net -1358 ml   General Appearance:Awake, lying in bed, Obese habitus Lungs:Clear to auscultation bilaterally, respirations unlabored, breathing on room air Heart:Regular rate and rhythm, S1 and S2 normal, no murmur, rub or gallop  Abdomen:Mildly distended, tenderness to palpation Extremities:Right BKA, 3+ pitting edema in LLE with tenderness to palpation. No erythema or warmth or lesions.  Skin:Warm and Dry  Psych: Congruent mood and affect  Neurologic: A&Ox3. No focal neural deficits.   Assessment/Plan:  Active Problems:   Insulin dependent type 2 diabetes mellitus, controlled (HCC)   History of DVT (deep vein thrombosis)   Hypertension   Constipation   Leg swelling   CKD (chronic kidney disease)   Abdominal pain   Acute diastolic (congestive) heart failure (HCC)  Acute Diastolic (Congestive) Heart Failure ([place]) Improving Patient with HPpEF (EF 60-65%) presented with SOB, anasarca and significant right lower extremity edema. ED labs showed BNP of 493 and  CXR noted for pulmonary edema consistent with hypervolemia. Receiving IV diuretics and is currently respirating on room air. He still has Left LLE and would like to try compression stockings.  He has had a significant increase in output since admission(net output: -16L). We will continue to diuresis and closely monitor electrolytes.  --continue Metolazone 2.5mg  daily --continue IV furosemide 120mg  BID --supplemental O2 prn --Strict I/O --Fluid Restriction --Daily weights --BMP pending --Compression stockings   Chronic Kidney Disease- Stage 4  Improving His creatininehas been trending downward since admission. We will continuewith IV diuresis andmonitoringhis renal functionfor continued improvement. -BMP pending  Insulin Dependent Type 2 Diabetes Mellitus CBGs have been continued to be elvated. We are holding home meds. With elevated CBG this morning, we increased basal insulin 5 units. With his T2DM, increasing long-acting insulin and decreasing prandial may help with long-term glycemic control. We will continue to titrate his prandial and long-acting insulin as needed -- Diabetes coordinator following, appreciate recs --Lantus 35 units QHS --Insulin Aspart 10 units TID --SSI    Hypertension Currently taking Carvedilol, hydralazine, clonidine at home.BP has been trending 150s-140s/80s-70s over the last 24hrs --continue on home meds   Abdominal Pain He reports abdominal distension, nauesaand vomiting. His symptoms are most likely to due to constipation. After no success with Miralax, Senna or NuLYTLEY, We recommended adding Lactulose and Bisacodyl suppository. We discussedthe importance of consistency with treatment. He has had 2 BMs since overnight, so we will  continue current regimen. --Bisacodyl Suppository 10mg  --Lactulose solution TID --Zofran prn for nausea  --Simethicone tablet 80mg  --Promethazine (phenergan) injection 12.mg Q6H prn   --Dilaudid .5mg   injection QID prn  History of DVT/PE Prophylactic anticoagulation  --continue Eliquis 5mg     Gastroparesis He presents with nausea and vomiting with a medical history of Diabetes Mellitus Type 2. In 2018, he had a gastric emptying study that showed mild delayed gastric emptying. This could be contributing to his symptoms of nausea, vomiting and abdominal pain. Metoclopramide is typically first-line treatment but listed on his allergy list. We will continue to monitor his symptoms and explore other treatment options.      LOS: 5 days   Jacklynn Bue, Medical Student 09/12/2019, 10:16 AM

## 2019-09-12 NOTE — Progress Notes (Addendum)
Physical Therapy Treatment Patient Details Name: Mayur Duman MRN: [id number] DOB: [birth year] Today's Date: 09/12/2019    History of Present Illness Mr. Spadafore is 44 yo male with a medical history of HFpEF (60-65%), CKD4, insulin dependent T2DM, and history of DVT/PE who presented to the [facility] with SOB, significant left lower extremity edema, anasarca, and abdominal pain. He was admitted on 09/07/19 and being medically managed for Acute CHF exacerbation and chronic conditions.     PT Comments    Pt with flat affect, very limited interaction and verbalization throughout session. Pt repeatedly stating "I've never been this weak". Pt agreeable to OOB to WC and WC mobility in hall but declined HEP or further activity. Pt internally distracted and unwilling to share distraction or reason for lack of mobility. Pt educated for need to continuing sitting EOB for meals, performing transfers and HEP. Requested pt have someone bring in his prosthesis for further transfers and gait.   HR 92 with mobility    Follow Up Recommendations  No PT follow up;Supervision - Intermittent     Equipment Recommendations  None recommended by PT    Recommendations for Other Services       Precautions / Restrictions Precautions Precaution Comments: R BKA (prior) but prosthetic at home Restrictions Weight Bearing Restrictions: No    Mobility  Bed Mobility Overal bed mobility: Modified Independent             General bed mobility comments: HOB 30 degrees with pt able to transition from supine to sitting with significantly increased time >5 min  Transfers Overall transfer level: Needs assistance   Transfers: Lateral/Scoot Transfers          Lateral/Scoot Transfers: Supervision General transfer comment: WC setup and assist for lines with pt able to perform lateral scoot without assist with increased time>5 min to transition from EOB to chair.   Ambulation/Gait                 Nutritional therapist mobility: Yes Wheelchair propulsion: Both upper extremities Wheelchair parts: Independent Distance: 400 Wheelchair Assistance Details (indicate cue type and reason): supervision for IV only. Pt with 5 seated pauses during mobility due to fatigue with education to maximize distance.  Modified Rankin (Stroke Patients Only)       Balance                                            Cognition Arousal/Alertness: Awake/alert Behavior During Therapy: Flat affect Overall Cognitive Status: Difficult to assess                                 General Comments: pt with very limited verbalization throughout session and mostly answering with nodding or not responding. pt reports internal distraction      Exercises      General Comments        Pertinent Vitals/Pain Pain Assessment: 0-10 Pain Score: 4  Pain Location: lower abdomen Pain Descriptors / Indicators: Cramping Pain Intervention(s): Limited activity within patient's tolerance;Repositioned;Premedicated before session    Home Living Family/patient expects to be discharged to:: Private residence Living Arrangements: Alone Available Help at Discharge: Available PRN/intermittently Type of Home: Apartment Home Access: Level entry   Home  Layout: One level Home Equipment: Walker - 2 wheels;Wheelchair - manual;Tub bench;Grab bars - tub/shower;Grab bars - toilet      Prior Function Level of Independence: Needs assistance  Gait / Transfers Assistance Needed: ambulates short distance with RW and prosthetic, WC for mobility. able to transfer independently ADL's / Homemaking Assistance Needed: Pt does 75% of bathing - Aide assists with "hard to reach places"     PT Goals (current goals can now be found in the care plan section) Progress towards PT goals: Progressing toward goals    Frequency    Min 3X/week      PT Plan Current  plan remains appropriate    Co-evaluation              AM-PAC PT "6 Clicks" Mobility   Outcome Measure  Help needed turning from your back to your side while in a flat bed without using bedrails?: A Little Help needed moving from lying on your back to sitting on the side of a flat bed without using bedrails?: A Little Help needed moving to and from a bed to a chair (including a wheelchair)?: A Little Help needed standing up from a chair using your arms (e.g., wheelchair or bedside chair)?: A Lot Help needed to walk in hospital room?: A Lot Help needed climbing 3-5 steps with a railing? : Total 6 Click Score: 14    End of Session   Activity Tolerance: Patient tolerated treatment well Patient left: in chair;with call bell/phone within reach Nurse Communication: Mobility status PT Visit Diagnosis: Muscle weakness (generalized) (M62.81);Other abnormalities of gait and mobility (R26.89)     Time: 6734-1937 PT Time Calculation (min) (ACUTE ONLY): 36 min  Charges:  $Therapeutic Activity: 8-22 mins $Wheel Chair Management: 8-22 mins                     Tressia Labrum P, PT Acute Rehabilitation Services Pager: [phone] Office: [place] 09/12/2019, 1:25 PM

## 2019-09-12 NOTE — Progress Notes (Signed)
Orthopedic Tech Progress Note Patient Details:  [name] 04-30-1975 [id number]  Ortho Devices Type of Ortho Device: Haematologist Ortho Device/Splint Location: LLE Ortho Device/Splint Interventions: Ordered, Application   Post Interventions Patient Tolerated: Well Instructions Provided: Care of device   [name] 09/12/2019, 4:42 PM

## 2019-09-12 NOTE — Progress Notes (Signed)
Mobility Specialist: Progress Note    09/12/19 1424  Mobility  Activity  (Bed Exercises)  Level of Assistance Independent  Assistive Device None  Mobility Response Tolerated well  Mobility performed by Mobility specialist  $Mobility charge 1 Mobility   Pre-Mobility: 79 HR, 139/83 BP Post-Mobility: 82 HR, 139/87 BP, 97% SpO2  Attempted to work w/ pt in wheelchair in the hallway but pt asked if we could do leg exercises instead b/c he felt "weak." Pt did work w/ PT in the wheelchair earlier today in the hallway. Pt performed three sets of ten leg extensions on his L leg. Pt did seem to have more force when performing leg extensions today compared to yesterday. Pt c/o of feeling weak all over today.   [facility] Cheria Sadiq Mobility Specialist

## 2019-09-13 LAB — BASIC METABOLIC PANEL
Anion gap: 9 (ref 5–15)
BUN: 92 mg/dL — ABNORMAL HIGH (ref 6–20)
CO2: 30 mmol/L (ref 22–32)
Calcium: 9.3 mg/dL (ref 8.9–10.3)
Chloride: 95 mmol/L — ABNORMAL LOW (ref 98–111)
Creatinine, Ser: 3.91 mg/dL — ABNORMAL HIGH (ref 0.61–1.24)
GFR calc Af Amer: 21 mL/min — ABNORMAL LOW (ref >60)
GFR calc non Af Amer: 18 mL/min — ABNORMAL LOW (ref >60)
Glucose, Bld: 125 mg/dL — ABNORMAL HIGH (ref 70–99)
Potassium: 3.3 mmol/L — ABNORMAL LOW (ref 3.5–5.1)
Sodium: 134 mmol/L — ABNORMAL LOW (ref 135–145)

## 2019-09-13 LAB — GLUCOSE, CAPILLARY
Glucose-Capillary: 229 mg/dL — ABNORMAL HIGH (ref 70–99)
Glucose-Capillary: 153 mg/dL — ABNORMAL HIGH (ref 70–99)
Glucose-Capillary: 284 mg/dL — ABNORMAL HIGH (ref 70–99)
Glucose-Capillary: 257 mg/dL — ABNORMAL HIGH (ref 70–99)

## 2019-09-13 LAB — CBC
HCT: 31.9 % — ABNORMAL LOW (ref 39.0–52.0)
Hemoglobin: 9.8 g/dL — ABNORMAL LOW (ref 13.0–17.0)
MCH: 27.3 pg (ref 26.0–34.0)
MCHC: 30.7 g/dL (ref 30.0–36.0)
MCV: 88.9 fL (ref 80.0–100.0)
Platelets: 459 10*3/uL — ABNORMAL HIGH (ref 150–400)
RBC: 3.59 MIL/uL — ABNORMAL LOW (ref 4.22–5.81)
RDW: 14.8 % (ref 11.5–15.5)
WBC: 10 10*3/uL (ref 4.0–10.5)
nRBC: 0 % (ref 0.0–0.2)

## 2019-09-13 MED ORDER — INSULIN GLARGINE 100 UNIT/ML ~~LOC~~ SOLN
10.0000 [IU] | Freq: Once | SUBCUTANEOUS | Status: AC
  Administered 2019-09-13: 10 [IU] via SUBCUTANEOUS
  Filled 2019-09-13: qty 0.1

## 2019-09-13 MED ORDER — PROMETHAZINE HCL 25 MG/ML IJ SOLN
12.5000 mg | Freq: Four times a day (QID) | INTRAMUSCULAR | Status: DC | PRN
  Administered 2019-09-14: 12.5 mg via INTRAVENOUS
  Filled 2019-09-13: qty 1

## 2019-09-13 NOTE — Social Work (Signed)
  CSW acknowledges consult for SNF. However PT is recommending no PT follow up at this time. This consult is being closed out.

## 2019-09-13 NOTE — Progress Notes (Signed)
Subjective: [name] states the he has had some success and was able to have a large bowel movement yesterday afternoon. He believes the Lactulose attributed to his progress. His nausea is improved and he is able to tolerate soft foods, so he would like to advance his diet. He does not report dyspnea on room air and has not noticed change in his swelling.   Objective:  Vital signs in last 24 hours: Vitals:   09/13/19 0245 09/13/19 0321 09/13/19 0324 09/13/19 0742  BP:  (!) 125/59  (!) 125/51  Pulse:  77  78  Resp: 15 18  14   Temp:  98 F (36.7 C)  98.5 F (36.9 C)  TempSrc:  Oral  Oral  SpO2:  95%  96%  Weight:   (!) 151.2 kg    Weight change: 0.379 kg  Intake/Output Summary (Last 24 hours) at 09/13/2019 1026 Last data filed at 09/13/2019 0741 Gross per 24 hour  Intake 600 ml  Output 1700 ml  Net -1100 ml   General Appearance:Awake, sitting in bed, Obese habitus, NAD, dry-heaving  Lungs:Clear to auscultation bilaterally, respirations unlabored, breathing on room air Heart:Regular rate and rhythm, S1 and S2 normal, no murmur, rub or gallop  Abdomen:Mildly distended, normoactive bowel sounds, no tenderness to palpation Extremities:Right BKA, wrapped LLE with edema Skin:Warm and Dry  Psych: Congruent mood and affect  Neurologic: No focal neural deficits  [name] is 44 yo male with a medical history of HFpEF (60-65%), CKD4, insulin dependent T2DM, and history of DVT/PE who presented to the [facility] with SOB, significant left lower extremity edema, anasarca, and abdominal pain. He was admitted on 08/29/21and is being medically managed for Acute CHF exacerbation, constipation and chronic conditions.    Assessment/Plan:  Active Problems:   Insulin dependent type 2 diabetes mellitus, controlled (HCC)   History of DVT (deep vein thrombosis)   Hypertension   Constipation   Leg swelling   CKD (chronic kidney disease)   Abdominal pain   Acute diastolic (congestive) heart  failure (HCC)  Acute Diastolic (Congestive) Heart Failure ([place]) Improving Patient with HPpEF (EF 60-65%) presented with SOB, anasarca and significant right lower extremity edema. He received IV diuretics for 5 days, and had a significant output since admission(net output: -17L). His creatinine was elevated from yesterday's labs (3.54 to 4.28) so will hold off on diuretics. He is currently respirating onroom air and does not report dyspnea. We will continue UNNA boots for left LLE and continue to monitor labs.  --trend renal function --discontinue metolazone  --discontinue IV furosemide  --continue UNNA boots --supplemental O2 prn  Chronic Kidney Disease- Stage 4  Improving He received 5 days of IV furosemide and metolazone. His creatinine was trending downwards but was elevated from yesterday's labs (3.54 to 4.28). We will discontinue diuresis for now and continue to monitor his labs. --trend renal function as above  Insulin Dependent Type 2 Diabetes Mellitus CBGs have beencontinued to be elevated in the mornings (220s). We are holding home meds. We will increase his  basal insulin 10 units this morning.We will continue to titrate his prandial and long-acting insulin as needed. --Diabetes coordinator following, appreciate recs --Lantus40units QHS --Insulin Aspart 10 unitsTID --SSI  --CBG monitoring   Hypertension improving BP have been trending 120s/50s over the last 24hrs. Currently taking Carvedilol, hydralazine, clonidine at home. --continue on home meds   Abdominal Pain improving He presented with abdominal distension, nauesaandvomiting.His symptoms are most likely to due to constipation. After no success  with Miralax, Senna or NuLYTLEY,we recommended adding Lactulose and Bisacodyl suppository. We discussedthe importance of consistency with treatment.He has had successful BMs with combination of osmotic and stimulant agents. We will continue current regimen  until discharge.  --Lactulose solution TID --Bisacodyl Suppository 10mg  prn --Zofran prn for nausea   History of DVT/PE Prophylactic anticoagulation  --continue Eliquis 5mg   DVT prophx: eliquis Diet: Renal Code: Full  Prior to Admission Living Arrangement: Home Anticipated Discharge Location: Home Barriers to Discharge: medical treatment     LOS: 6 days   Jacklynn Bue, Medical Student 09/13/2019, 10:26 AM

## 2019-09-13 NOTE — Progress Notes (Signed)
Mobility Specialist - Progress Note   09/13/19 1248  Mobility  Activity Refused mobility   Pt refused mobility stating he has been feeling weak today, he says he will try and go in the hallway w/ his girlfriend later today.   Pricilla Handler Mobility Specialist Mobility Specialist Phone: [phone]

## 2019-09-14 LAB — GLUCOSE, CAPILLARY
Glucose-Capillary: 233 mg/dL — ABNORMAL HIGH (ref 70–99)
Glucose-Capillary: 112 mg/dL — ABNORMAL HIGH (ref 70–99)
Glucose-Capillary: 152 mg/dL — ABNORMAL HIGH (ref 70–99)

## 2019-09-14 LAB — CBC
HCT: 30.8 % — ABNORMAL LOW (ref 39.0–52.0)
Hemoglobin: 9.7 g/dL — ABNORMAL LOW (ref 13.0–17.0)
MCH: 27.8 pg (ref 26.0–34.0)
MCHC: 31.5 g/dL (ref 30.0–36.0)
MCV: 88.3 fL (ref 80.0–100.0)
Platelets: 459 10*3/uL — ABNORMAL HIGH (ref 150–400)
RBC: 3.49 MIL/uL — ABNORMAL LOW (ref 4.22–5.81)
RDW: 14.9 % (ref 11.5–15.5)
WBC: 9.2 10*3/uL (ref 4.0–10.5)
nRBC: 0 % (ref 0.0–0.2)

## 2019-09-14 LAB — BASIC METABOLIC PANEL
Anion gap: 11 (ref 5–15)
BUN: 82 mg/dL — ABNORMAL HIGH (ref 6–20)
CO2: 26 mmol/L (ref 22–32)
Calcium: 8.9 mg/dL (ref 8.9–10.3)
Chloride: 97 mmol/L — ABNORMAL LOW (ref 98–111)
Creatinine, Ser: 3.31 mg/dL — ABNORMAL HIGH (ref 0.61–1.24)
GFR calc Af Amer: 25 mL/min — ABNORMAL LOW (ref >60)
GFR calc non Af Amer: 22 mL/min — ABNORMAL LOW (ref >60)
Glucose, Bld: 174 mg/dL — ABNORMAL HIGH (ref 70–99)
Potassium: 3.4 mmol/L — ABNORMAL LOW (ref 3.5–5.1)
Sodium: 134 mmol/L — ABNORMAL LOW (ref 135–145)

## 2019-09-14 MED ORDER — FUROSEMIDE 80 MG PO TABS
160.0000 mg | ORAL_TABLET | Freq: Every morning | ORAL | Status: DC
  Administered 2019-09-14: 160 mg via ORAL
  Filled 2019-09-14: qty 2

## 2019-09-14 MED ORDER — POTASSIUM CHLORIDE CRYS ER 20 MEQ PO TBCR
20.0000 meq | EXTENDED_RELEASE_TABLET | Freq: Three times a day (TID) | ORAL | Status: DC
  Administered 2019-09-14 (×2): 20 meq via ORAL
  Filled 2019-09-14 (×2): qty 1

## 2019-09-14 MED ORDER — OXYCODONE HCL 10 MG PO TABS
10.0000 mg | ORAL_TABLET | Freq: Four times a day (QID) | ORAL | 0 refills | Status: AC | PRN
Start: 2019-09-14 — End: 2019-09-17

## 2019-09-14 MED ORDER — FUROSEMIDE 80 MG PO TABS: 80.0000 mg | ORAL_TABLET | Freq: Every evening | ORAL | Status: DC

## 2019-09-14 MED ORDER — METOLAZONE 5 MG PO TABS
2.5000 mg | ORAL_TABLET | ORAL | Status: DC
  Administered 2019-09-14: 2.5 mg via ORAL
  Filled 2019-09-14: qty 1

## 2019-09-14 NOTE — Progress Notes (Signed)
Mobility Specialist - Progress Note   09/14/19 1152  Mobility  Activity Ambulated in hall  Level of Assistance Modified independent, requires aide device or extra time  Assistive Device Wheelchair  Distance Ambulated (ft) 790 ft  Mobility Response Tolerated fair  Mobility performed by Mobility specialist  $Mobility charge 1 Mobility    Pre-mobility: 73 HR, 93% SpO2 During mobility: 77-88 HR, 94-97% SpO2 Post-mobility: 81 HR, 98%SPO2  Pt ambulated on RA. Pt took multiple rest breaks, stating he is trying to learn how to pace himself better. He denied any SOB, dizziness, or lightheadedness during activity. Pt left in wheelchair in room on RA as he wanted to sit up for a bit.    Pricilla Handler Mobility Specialist Mobility Specialist Phone: [phone]

## 2019-09-14 NOTE — Progress Notes (Signed)
   Subjective: No acute overnight events. He reports 1 bowel movement last night now with improved abdominal pain. Would like to continue lactulose. Continues to deny nausea and tolerating diet. Reports some intermittent dyspnea but feeling on [place]. He denies other symptoms.  Objective:  Vital signs in last 24 hours: Vitals:   09/13/19 1944 09/13/19 2122 09/14/19 0013 09/14/19 0417  BP: (!) 145/72 135/63 (!) 153/70 132/66  Pulse: 81  76 77  Resp: 20  17 20   Temp: 98.1 F (36.7 C)  98 F (36.7 C) 97.6 F (36.4 C)  TempSrc: Oral  Oral Oral  SpO2: 96%  98% 98%  Weight:    (!) 151 kg   Weight change: -0.2 kg   Intake/Output Summary (Last 24 hours) at 09/14/2019 9470 Last data filed at 09/14/2019 0420 Gross per 24 hour  Intake --  Output 2500 ml  Net -2500 ml   General Appearance:Awake, lying in bed watching tv, Obese habitus, NAD Lungs:Clear to auscultation bilaterally, respirations unlabored, breathing on room air Heart:Regular rate and rhythm, S1 and S2 normal, no murmur, rub or gallop  Abdomen:Mildly distended, very mild discomfort to palpation, normal BS Extremities:Right BKA, 1+ pitting edema in LLE.wrapped in Unna boot Skin:Warm and Dry  Psych: Congruent mood and affect  Neurologic: Spontaneous moves extremities. No focal neural deficits.  Assessment/Plan:  Active Problems:   Insulin dependent type 2 diabetes mellitus, controlled (HCC)   History of DVT (deep vein thrombosis)   Hypertension   Constipation   Leg swelling   CKD (chronic kidney disease)   Abdominal pain   Acute diastolic (congestive) heart failure [facility])  Patient Summary [name] is 44 yo male with a medical history of HFpEF (60-65%), CKD4, insulin dependent T2DM, and history of DVT/PE who presented to the [facility] with SOB, significant left lower extremity edema, anasarca, and abdominal pain. He was admitted on 09/07/19 and being medically managed for Acute CHF exacerbation, abdominal pain and chronic  conditions.   Acute Diastolic (Congestive) Heart Failure (HCC) Improving Improved edema.Cr improved to 3.31 off diuretics. Has not desatted but on [place] for comfort. We will continue UNNA boots for left LLE and continue to monitor labs.  --will restart on home medications  Chronic Kidney Disease- Stage 4 Improving He received 5 days of IV furosemide and metolazone. His creatinine was trending downwards to 3.54 with elevation on day 5 to 4.28. Now improved to 3.31. Discontinued lasix and metolazone  --Potassium 3.4, repleted  Insulin Dependent Type 2 Diabetes Mellitus CBGs have beencontinued to be elevated in the mornings (220s). We are holding home meds. We will increase his  basal insulin 10 units this morning.We will continue to titrate his prandial and long-acting insulin as needed. --Lantus40units QHS --Insulin Aspart 10unitsTID --SSI  --CBG monitoring  Hypertension improving BP have been trending 120s/50s over the last 24hrs. Currently taking Carvedilol, hydralazine, clonidine at home. --continue on home meds  Abdominal Pain improving Improved after 1 large BM per nurse last night and no longer with nausea or vomting. Tolerated heart healthy diet.  --Will discharge on miralax and senna --Zofran prn for nausea   History of DVT/PE Prophylactic anticoagulation  --continue Eliquis 5mg   DVT prophx:eliquis Diet:Renal Code:Full  Prior to Admission Living Arrangement:Home Anticipated Discharge Location:Home today Barriers to Discharge:none  Victor Beard, MD 09/14/2019, 6:53 AM Pager: [phone] After 5pm on weekdays and 1pm on weekends: On Call pager [phone]

## 2019-09-14 NOTE — Plan of Care (Signed)
DISCHARGE NOTE HOME [name] to be discharged home per MD order. Discussed prescriptions and follow up appointments with the patient. Prescriptions given to patient; medication list explained in detail. Patient verbalized understanding.  Skin clean, dry and intact without evidence of skin break down, no evidence of skin tears noted. IV catheter discontinued intact. Site without signs and symptoms of complications. Dressing and pressure applied. Pt denies pain at the site currently. No complaints noted.  Patient free of lines, drains, and wounds.   An After Visit Summary (AVS) was printed and given to the patient. Patient escorted via wheelchair, and discharged home via private auto.  Stephan Minister, RN

## 2019-09-14 NOTE — Discharge Summary (Signed)
Name: [name] MRN: [id number] DOB: August 29, 1975 44 y.o. PCP: [name], MD  Date of Admission: 09/07/2019  8:32 AM Date of Discharge: 9/5/20219/05/2019 Attending Physician: No att. providers found  Discharge Diagnosis: 1. Acute CHF 2. Abdominal pain and nausea secondary to constipation 3. Acute on chronic CKD stage IV  Discharge Medications:    Reactions   Reglan [metoclopramide] Other (See Comments)   Dysphoric reaction   Metformin And Related Diarrhea   Ozempic (0.25 Or 0.5 Mg-dose) [semaglutide(0.25 Or 0.59m-dos)] Nausea And Vomiting      Medication List    TAKE these medications   Accu-Chek Guide Me w/Device Kit Use to check blood sugar up to 3 times a day What changed:   how much to take  how to take this  when to take this  additional instructions   Accu-Chek Guide test strip Generic drug: glucose blood Check blood sugar up to 3 times a day What changed:   how much to take  how to take this  when to take this  additional instructions   accu-chek softclix lancets Use to check blood sugars 5 times a day. Dx code:E11.9. Insulin dependent. What changed:   how much to take  how to take this  when to take this   Accu-Chek Softclix Lancets lancets Check blood sugar up to 3 times a day What changed:   how much to take  how to take this  when to take this  additional instructions   acidophilus Caps capsule Take 1 capsule by mouth daily.   allopurinol 100 MG tablet Commonly known as: ZYLOPRIM Take 2 tablets (200 mg total) by mouth daily.   Alpha-Lipoic Acid 600 MG Caps Take 600 mg by mouth daily.   amLODipine 10 MG tablet Commonly known as: NORVASC Take 10 mg by mouth at bedtime.   B-12 500 MCG Subl Place 500 mcg under the tongue daily.   calcitRIOL 0.5 MCG capsule Commonly known as: ROCALTROL Take 2.5 mcg by mouth daily.   carvedilol 25 MG tablet Commonly known as: COREG Take 1 tablet (25 mg total) by mouth 2 (two)  times daily with a meal.   cloNIDine 0.3 MG tablet Commonly known as: CATAPRES Take 1 tablet (0.3 mg total) by mouth 2 (two) times daily.   Eliquis 5 MG Tabs tablet Generic drug: apixaban Take 1 tablet by mouth twice daily What changed: how much to take   famotidine 20 MG tablet Commonly known as: PEPCID Take 1 tablet (20 mg total) by mouth 2 (two) times daily. What changed:   when to take this  reasons to take this   Fish Oil 1000 MG Caps Take 1,000 mg by mouth at bedtime.   furosemide 80 MG tablet Commonly known as: LASIX Take 1 tablet (80 mg total) by mouth 2 (two) times daily. What changed:   how much to take  additional instructions   gabapentin 100 MG capsule Commonly known as: NEURONTIN Take 1 capsule (100 mg total) by mouth 3 (three) times daily.   hydrALAZINE 50 MG tablet Commonly known as: APRESOLINE Take 1 tablet by mouth 4 times daily What changed: See the new instructions.   insulin aspart 100 UNIT/ML FlexPen Commonly known as: NOVOLOG Inject 25 Units into the skin 3 (three) times daily with meals. 20 unit morning, 25 units lunch 30 units for dinner What changed:   how much to take  when to take this  additional instructions   lovastatin 20 MG tablet Commonly known as:  MEVACOR Take 1 tablet by mouth once daily What changed: when to take this   Melatonin 10 MG Tabs Take 10 mg by mouth at bedtime as needed (sleep).   metolazone 2.5 MG tablet Commonly known as: ZAROXOLYN Take 2.5 mg by mouth every other day.   ondansetron 4 MG tablet Commonly known as: ZOFRAN Take 1 tablet (4 mg total) by mouth every 6 (six) hours as needed for nausea.   oxybutynin 5 MG tablet Commonly known as: DITROPAN Take 5 mg by mouth 3 (three) times daily.   Oxycodone HCl 10 MG Tabs Take 1 tablet (10 mg total) by mouth every 6 (six) hours as needed for up to 3 days. What changed: reasons to take this   Pen Needles 3/16" 31G X 5 MM Misc Use five times  daily What changed:   how much to take  how to take this  when to take this   B-D UF III MINI PEN NEEDLES 31G X 5 MM Misc Generic drug: Insulin Pen Needle USE  4 TIMES DAILY What changed:   how much to take  how to take this  when to take this  additional instructions   polyethylene glycol 17 g packet Commonly known as: MIRALAX / GLYCOLAX Take 17 g by mouth 2 (two) times daily.   senna-docusate 8.6-50 MG tablet Commonly known as: Senokot-S Take 2 tablets by mouth 2 (two) times daily.   tamsulosin 0.4 MG Caps capsule Commonly known as: FLOMAX Take 0.4 mg by mouth at bedtime.   Tyler Aas FlexTouch 200 UNIT/ML FlexTouch Pen Generic drug: insulin degludec INJECT 70 UNITS SUBCUTANEOUSLY ONCE DAILY What changed: See the new instructions.   Victoza 18 MG/3ML Sopn Generic drug: liraglutide INJECT 1.2 MG (0.2 MLS) INTO THE SKIN ONCE DAILY AT THE SAME TIME. What changed:   how much to take  how to take this  when to take this      Disposition and follow-up:   VictorSheila Bowker was discharged from [facility] in Stable condition.  At the hospital follow up visit please address:  1.   A. CHF Emphasized  Importance of medication adherence. B. Abdominal pain with nausea and vomiting: Encouraged patient to continue with bowel regimen to prevent further constipation.  C. Cholelithiasis may need surgical follow up for future elective cholecystectomy  2.  Labs / imaging needed at time of follow-up: none  3.  Pending labs/ test needing follow-up: BMP  Follow-up Appointments: PCP Dr. Gilford Rile in 1 week  He will need follow up with [facility] regarding antibiotic prophylaxis for recurrent UTI was previously on bactrim but seems to have been discontinued after last admission.   Hospital Course by problem list:  [name] is 44 yo male with a medical history of HFpEF (60-65%), CKD4, insulin dependent T2DM, and history of DVT/PE who presented to the  [facility] with SOB, significant left lower extremity edema, anasarca, and abdominal pain. He was admitted on 09/07/19 and being medically managed for Acute CHF exacerbation, abdominal pain and chronic conditions.    Acute Diastolic (Congestive) Heart Failure ([place]) He has a history of HPpEF (EF 60-65%) and presented with SOB, anasarca and significant right lower extremity edema.  ED labs showed BNP of 493 and CXR noted for pulmonary edema consistent with hypervolemia. He received IV lasix and metolazone with electrolyte monitoring. He has had a significant increase in output since admission (-21L). Admission weigh 164.6 kg and diuresed to 151 kg. He reports that his dyspnea and  edema has improved. Will discharge home on Lasix  -Resume on home lasix 160 mg am, 80 mg pm, metolazone 2.5 mg every other day -Oxycodone 10 mg PRN for pain  Abdominal Pain On this admission he presented abdominal distension, nauesa and vomiting.He stated he has not had a bowel movement since 09/02/19. Abdominal pain likely due to a combination of edema from heart failure, constipation, and GERD. CT did not show any acute process in abdomen or pelvis. He had difficulty adhering to Miralax. Senna, and simethicone due to nausea and vomiting. Nausea treated with IV phenergan and Protonix. Place on bisacodyl suppositories and lactulose. He subsequently 2 small BM on 09/10/2019 and a large bowel movement on 09/13/2019 with improvement of abdominal pain and resolution of nausea and vomiting. The patient was concerned about the presence of calcified gallstones without cholecystitis on his CT abdomen pelvis. Consulted general surgery regarding this and reiterated that with the pandemic all elective procedure all elective procedures have been put on hold.  - Discharged with limited prescription for Zofran - Counseled regarding OTC bowel regimen   Chronic Kidney Disease- Stage 4 AKI, resolved His creatinine has improved from 4.67 on admission to  3.31 which is his baseline. He likely had an AKI due to extravascular hypervolemia. We ordered IV diuresis and monitored his renal function for continued improvement. Cr downtrended with diuresis to 3.54 with increase to 4.2 on day 5 to 3.31 after discontinuing diuresis and GFR or 25. -will need follow up with nephrology  Insulin Dependent Type 2 Diabetes Mellitus Home regimen victoza 1.43m daily, tresiba 96u in AM, novolog 20u breakfast, 25u lunch, 30u dinner. We held his home meds and titrated prandial and basal insulin as needed.    Hypertension Currently taking Carvedilol, hydralazine, clonidine at home. We continued him on his home meds.   Recurrent Cystitis Follows w/ [facility] for hx of recurrent UTIs. Takes prophylactic Bactrim. Received 10 days of cephalosporin therapy on last admission. Bactrim held this admission in the setting of SKI UA negative for UTI, and pt has not endorsed urinary symptoms. Low suspicion for current infection. - Will need follow up with his urologist at [facility]  History of DVT/PE He received prophylactic anticoagulation with Eliquis 55m   Discharge Vitals:   BP 108/60 (BP Location: Right Arm)   Pulse 72   Temp 98 F (36.7 C) (Oral)   Resp 16   Wt (!) 151 kg   SpO2 97%   BMI 42.74 kg/m   Pertinent Labs, Studies, and Procedures:   CLINICAL DATA:  Abdominal distension and pain.  EXAM: CT ABDOMEN AND PELVIS WITHOUT CONTRAST  TECHNIQUE: Multidetector CT imaging of the abdomen and pelvis was performed following the standard protocol without IV contrast.  COMPARISON:  CT abdomen pelvis dated 08/29/2019.  FINDINGS: Lower chest: Moderate ground-glass opacities/consolidation are seen in the lung bases.  Hepatobiliary: No focal liver abnormality is seen. Gallstones layer dependently in the gallbladder. No gallbladder wall thickening or biliary dilatation.  Pancreas: Unremarkable. No pancreatic ductal dilatation  or surrounding inflammatory changes.  Spleen: Normal in size without focal abnormality.  Adrenals/Urinary Tract: Adrenal glands are unremarkable. Kidneys are normal, without renal calculi, focal lesion, or hydronephrosis. Bladder is unremarkable.  Stomach/Bowel: Stomach is within normal limits. Appendix appears normal. No evidence of bowel wall thickening, distention, or inflammatory changes.  Vascular/Lymphatic: No significant vascular findings are present. No enlarged abdominal or pelvic lymph nodes.  Reproductive: Prostate is unremarkable.  Other: No abdominal wall hernia or  abnormality. No abdominopelvic ascites.  Musculoskeletal: No acute or significant osseous findings.  IMPRESSION: 1. No acute process in the abdomen or pelvis. 2. Moderate ground-glass opacities/consolidation in the lung bases likely reflect pneumonia. 3. Cholelithiasis.   Electronically Signed   By: Zerita Boers M.D.   On: 09/07/2019 14:13  CLINICAL DATA:  Shortness of breath, abdominal pain.  EXAM: PORTABLE CHEST 1 VIEW  COMPARISON:  Chest radiograph dated 07/30/2019.  FINDINGS: The heart size is borderline enlarged. Moderate to severe bilateral airspace opacities are noted. There is no pleural effusion or pneumothorax. The visualized skeletal structures are unremarkable.  IMPRESSION: 1. Moderate to severe bilateral airspace opacities likely represent pulmonary edema or multifocal pneumonia.   Electronically Signed   By: Zerita Boers M.D.   On: 09/07/2019 14:07   Discharge Instructions: Discharge Instructions    Diet - low sodium heart healthy   Complete by: As directed    Discharge instructions   Complete by: As directed    You were hospitalized for exacerbation of heart failure and abdominal pain with nausea and vomiting. Thank you for allowing [place] to be part of your care.    Please follow up with the following providers:  PCP : Dr Gilford Rile in the next  week.  Follow up with your urologist  Please note these changes made to your medications: Oxybutynin 5 mg three times a day  - Medications to continue: Please continue to use Miralax and senna to prevent further constipation, and continue your home medications as prescribed.  Please call our clinic if you have any questions or concerns, we may be able to help and keep you from a long and expensive emergency room wait. Our clinic and after hours phone number is [phone], the best time to call is Monday through Friday 9 am to 4 pm but there is always someone available 24/7 if you have an emergency. If you need medication refills please notify your pharmacy one week in advance and they will send [place] a request.   Increase activity slowly   Complete by: As directed     You were hospitalized for exacerbation of heart failure and abdominal pain with nausea and vomiting. Thank you for allowing [place] to be part of your care.   Please follow up with the following providers:  -PCP  Dr. Gilford Rile in the next week.  -Follow up with your urologist  Please note these changes made to your medications: Oxybutynin 5 mg three times a day  Medications to continue: Please continue to use Miralax and senna to prevent further constipation, and continue your home medications as prescribed.  Please call our clinic if you have any questions or concerns, we may be able to help and keep you from a long and expensive emergency room wait. Our clinic and after hours phone number is [phone], the best time to call is Monday through Friday 9 am to 4 pm but there is always someone available 24/7 if you have an emergency. If you need medication refills please notify your pharmacy one week in advance and they will send [place] a request.  Signed: Iona Beard, MD 09/14/2019, 7:45 PM   Pager: [phone]

## 2019-09-16 ENCOUNTER — Ambulatory Visit: Payer: Medicare Other | Admitting: *Deleted

## 2019-09-16 DIAGNOSIS — E119 Type 2 diabetes mellitus without complications: Secondary | ICD-10-CM

## 2019-09-16 DIAGNOSIS — E785 Hyperlipidemia, unspecified: Secondary | ICD-10-CM

## 2019-09-16 DIAGNOSIS — I10 Essential (primary) hypertension: Secondary | ICD-10-CM

## 2019-09-16 NOTE — Chronic Care Management (AMB) (Signed)
  Chronic Care Management   Note  09/16/2019 Name: [name] MRN: [id number] DOB: February 05, 1975  Successful outreach to patient to complete post hospital discharge transition of care call. Patient was hospitalized at [name] from 8/29-9/5 for abdominal pain, heart failure and community acquired pneumonia. When discussing diagnosis of heart failure and action plan,he states he does not have scales to take daily weights.   Follow up plan: Advised patient this CCM RN will call his cardiologist's office to see if scales for home use can be secured.  The care management team will reach out to the patient again over the next 30-60 days.   Kelli Churn RN, CCM, [facility] RN Care Manager [phone]

## 2019-09-19 ENCOUNTER — Telehealth: Payer: Self-pay | Admitting: *Deleted

## 2019-09-19 ENCOUNTER — Ambulatory Visit: Payer: Medicare Other | Admitting: *Deleted

## 2019-09-19 ENCOUNTER — Telehealth: Payer: Self-pay | Admitting: Cardiology

## 2019-09-19 DIAGNOSIS — E785 Hyperlipidemia, unspecified: Secondary | ICD-10-CM

## 2019-09-19 DIAGNOSIS — G4733 Obstructive sleep apnea (adult) (pediatric): Secondary | ICD-10-CM

## 2019-09-19 DIAGNOSIS — E119 Type 2 diabetes mellitus without complications: Secondary | ICD-10-CM

## 2019-09-19 DIAGNOSIS — N184 Chronic kidney disease, stage 4 (severe): Secondary | ICD-10-CM

## 2019-09-19 DIAGNOSIS — I10 Essential (primary) hypertension: Secondary | ICD-10-CM

## 2019-09-19 DIAGNOSIS — N170 Acute kidney failure with tubular necrosis: Secondary | ICD-10-CM

## 2019-09-19 NOTE — Telephone Encounter (Signed)
Staff message sent to Gae Bon ok to schedule sleep study. No PA is required. Patient has Medicare.

## 2019-09-19 NOTE — Telephone Encounter (Signed)
DME received the order and states patient has to start over with an in lab sleep study. Patients machine was picked up on 03/20/19 for non compliance.   Per dr Radford Pax: Please order a split night sleep study  Split night sleep study ordered and sent to sleep pool.

## 2019-09-19 NOTE — Telephone Encounter (Signed)
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 Says Dr Radford Pax was helping him get a CPAP machine  She is following up to see if there is anything she can do to help with the process   Please advise

## 2019-09-19 NOTE — Chronic Care Management (AMB) (Signed)
Chronic Care Management   Follow Up Note   09/19/2019 Name: [name] MRN: [id number] DOB: 1975-09-30  Referred by: Victor Mercury, MD Reason for referral : Chronic Care Management (IDDM, HF, HLD, HTN, CKD)   [name] is a 44 y.o. year old male who is a primary care patient of Victor Mercury, MD. The CCM team was consulted for assistance with chronic disease management and care coordination needs.    Review of patient status, including review of consultants reports, relevant laboratory and other test results, and collaboration with appropriate care team members and the patient's provider was performed as part of comprehensive patient evaluation and provision of chronic care management services.    SDOH (Social Determinants of Health) assessments performed: No See Care Plan activities for detailed interventions related to [facility])     Outpatient Encounter Medications as of 09/19/2019  Medication Sig Note  . Accu-Chek Softclix Lancets lancets Check blood sugar up to 3 times a day (Patient taking differently: 1 each by Other route See admin instructions. Check blood sugar up to 4-5 times a day)   . acidophilus (RISAQUAD) CAPS capsule Take 1 capsule by mouth daily.   Marland Kitchen allopurinol (ZYLOPRIM) 100 MG tablet Take 2 tablets (200 mg total) by mouth daily.   . Alpha-Lipoic Acid 600 MG CAPS Take 600 mg by mouth daily.   Marland Kitchen amLODipine (NORVASC) 10 MG tablet Take 10 mg by mouth at bedtime.   . Blood Glucose Monitoring Suppl (ACCU-CHEK GUIDE ME) w/Device KIT Use to check blood sugar up to 3 times a day (Patient taking differently: 1 each by Other route See admin instructions. Use to check blood sugar up to 4-5 times a day)   . calcitRIOL (ROCALTROL) 0.5 MCG capsule Take 2.5 mcg by mouth daily.    . carvedilol (COREG) 25 MG tablet Take 1 tablet (25 mg total) by mouth 2 (two) times daily with a meal.   . cloNIDine (CATAPRES) 0.3 MG tablet Take 1 tablet (0.3 mg total) by mouth 2 (two) times daily.    . Cyanocobalamin (B-12) 500 MCG SUBL Place 500 mcg under the tongue daily.    Marland Kitchen ELIQUIS 5 MG TABS tablet Take 1 tablet by mouth twice daily (Patient taking differently: Take 5 mg by mouth 2 (two) times daily. )   . famotidine (PEPCID) 20 MG tablet Take 1 tablet (20 mg total) by mouth 2 (two) times daily. (Patient taking differently: Take 20 mg by mouth 2 (two) times daily as needed for heartburn or indigestion. ) 09/16/2019: States he takes prn  . furosemide (LASIX) 80 MG tablet Take 1 tablet (80 mg total) by mouth 2 (two) times daily. (Patient taking differently: Take 80-160 mg by mouth 2 (two) times daily. 138m in the morning and 847min the afternoon.)   . gabapentin (NEURONTIN) 100 MG capsule Take 1 capsule (100 mg total) by mouth 3 (three) times daily.   . Marland Kitchenlucose blood (ACCU-CHEK GUIDE) test strip Check blood sugar up to 3 times a day (Patient taking differently: 1 each by Other route See admin instructions. Check blood sugar up to 4-5 times a day)   . hydrALAZINE (APRESOLINE) 50 MG tablet Take 1 tablet by mouth 4 times daily (Patient taking differently: Take 50 mg by mouth 4 (four) times daily. )   . insulin aspart (NOVOLOG) 100 UNIT/ML FlexPen Inject 25 Units into the skin 3 (three) times daily with meals. 20 unit morning, 25 units lunch 30 units for dinner (Patient taking differently: Inject 20-30  Units into the skin See admin instructions. 20 units in the morning, 25 units in the afternoon and 30 units before bedtime Note: Doesn't use on mornings when he works out.)   . Insulin Pen Needle (B-D UF III MINI PEN NEEDLES) 31G X 5 MM MISC USE  4 TIMES DAILY (Patient taking differently: 1 each by Other route See admin instructions. USE 4-5 TIMES DAILY)   . Insulin Pen Needle (PEN NEEDLES 3/16") 31G X 5 MM MISC Use five times daily (Patient taking differently: 1 each by Other route See admin instructions. Use five times daily)   . Lancet Devices (ACCU-CHEK SOFTCLIX) lancets Use to check blood sugars 5  times a day. Dx code:E11.9. Insulin dependent. (Patient taking differently: 1 each by Other route See admin instructions. Use to check blood sugars 5 times a day. Dx code:E11.9. Insulin dependent.)   . lovastatin (MEVACOR) 20 MG tablet Take 1 tablet by mouth once daily (Patient taking differently: Take 20 mg by mouth at bedtime. )   . Melatonin 10 MG TABS Take 10 mg by mouth at bedtime as needed (sleep).   . metolazone (ZAROXOLYN) 2.5 MG tablet Take 2.5 mg by mouth every other day.   . Omega-3 Fatty Acids (FISH OIL) 1000 MG CAPS Take 1,000 mg by mouth at bedtime.    . ondansetron (ZOFRAN) 4 MG tablet Take 1 tablet (4 mg total) by mouth every 6 (six) hours as needed for nausea.   Marland Kitchen oxybutynin (DITROPAN) 5 MG tablet Take 5 mg by mouth 3 (three) times daily.    . polyethylene glycol (MIRALAX / GLYCOLAX) 17 g packet Take 17 g by mouth 2 (two) times daily. (Patient taking differently: Take 17 g by mouth daily as needed. )   . senna-docusate (SENOKOT-S) 8.6-50 MG tablet Take 2 tablets by mouth 2 (two) times daily. (Patient taking differently: Take 2 tablets by mouth at bedtime as needed. )   . tamsulosin (FLOMAX) 0.4 MG CAPS capsule Take 0.4 mg by mouth at bedtime.    . TRESIBA FLEXTOUCH 200 UNIT/ML FlexTouch Pen INJECT 70 UNITS SUBCUTANEOUSLY ONCE DAILY (Patient taking differently: 96 Units. )   . VICTOZA 18 MG/3ML SOPN INJECT 1.2 MG (0.2 MLS) INTO THE SKIN ONCE DAILY AT THE SAME TIME. (Patient taking differently: Inject 1.2 mg into the skin See admin instructions. INJECT 1.2 MG (0.2 MLS) INTO THE SKIN ONCE DAILY AT THE SAME TIME.)    No facility-administered encounter medications on file as of 09/19/2019.     Objective:  Wt Readings from Last 3 Encounters:  09/14/19 (!) 332 lb 14.3 oz (151 kg)  09/02/19 (!) 368 lb 2.7 oz (167 kg)  07/30/19 (!) 366 lb 6.5 oz (166.2 kg)   Lab Results  Component Value Date   HGBA1C 5.9 (H) 08/30/2019   BP Readings from Last 3 Encounters:  09/14/19 108/60    09/02/19 (!) 165/62  08/14/19 (!) 162/83    Goals Addressed              This Visit's Progress     Patient Stated   .  " I don't have any scales and Dr. Radford Pax told me she would help me get a CPAP machine." (pt-stated)        CARE PLAN ENTRY (see longitudinal plan of care for additional care plan information)  Current Barriers:  . Care Coordination needs related to DME  in a patient with IDDM, HF, HLD, HTN, CKD- patient states he needs help securing scales and  CPAP, he says Dr Radford Pax, his cardiologist,  is supposed to help him get the CPAP machine  Nurse Case Manager Clinical Goal(s):  Marland Kitchen Over the next 7-10 days, patient will work with CCM team to address needs related to home scales  and CPAP  Interventions:  . Inter-disciplinary care team collaboration (see longitudinal plan of care) . Advised patient that this CCM RN called Dr Theodosia Blender office and they do not provide scales to their patients.  . Informed patient that this CCM RN has secured scales for him and will hand deliver them to him at his clinic appointment on 09/24/19. Marland Kitchen Contacted Dr Theodosia Blender office re: assistance with securing the CPAP machine- left message with Marissa who will forward request to Dr Radford Pax and her nurse and request they return call to this CCM RN  Patient Self Care Activities:  . Patient verbalizes understanding of plan to work with CCM team to secure DME of CPAP and scales . Self administers medications as prescribed . Attends all scheduled provider appointments . Calls pharmacy for medication refills . ADL's performed with PCS assist . IADL's performed with PCS assist . Calls provider office for new concerns or questions . Unable to independently secure scales and CPAP  Initial goal documentation         Plan:   The care management team will reach out to the patient again over the next 7-10 days.    Kelli Churn RN, CCM, [facility] RN Care Manager [phone]

## 2019-09-24 ENCOUNTER — Encounter: Payer: Self-pay | Admitting: Internal Medicine

## 2019-09-24 ENCOUNTER — Ambulatory Visit: Payer: Medicare Other | Admitting: *Deleted

## 2019-09-24 ENCOUNTER — Ambulatory Visit (INDEPENDENT_AMBULATORY_CARE_PROVIDER_SITE_OTHER): Payer: Medicare Other | Admitting: Internal Medicine

## 2019-09-24 ENCOUNTER — Other Ambulatory Visit: Payer: Self-pay

## 2019-09-24 VITALS — BP 129/75 | HR 96 | Temp 98.8°F | Ht 74.0 in

## 2019-09-24 DIAGNOSIS — I5032 Chronic diastolic (congestive) heart failure: Secondary | ICD-10-CM

## 2019-09-24 DIAGNOSIS — Z23 Encounter for immunization: Secondary | ICD-10-CM | POA: Diagnosis not present

## 2019-09-24 DIAGNOSIS — R1084 Generalized abdominal pain: Secondary | ICD-10-CM | POA: Diagnosis not present

## 2019-09-24 DIAGNOSIS — Z794 Long term (current) use of insulin: Secondary | ICD-10-CM

## 2019-09-24 DIAGNOSIS — I10 Essential (primary) hypertension: Secondary | ICD-10-CM

## 2019-09-24 DIAGNOSIS — N183 Chronic kidney disease, stage 3 unspecified: Secondary | ICD-10-CM

## 2019-09-24 DIAGNOSIS — E119 Type 2 diabetes mellitus without complications: Secondary | ICD-10-CM

## 2019-09-24 DIAGNOSIS — E785 Hyperlipidemia, unspecified: Secondary | ICD-10-CM

## 2019-09-24 NOTE — Progress Notes (Signed)
Internal Medicine Clinic Resident  I have personally reviewed this encounter including the documentation in this note and/or discussed this patient with the care management provider. I will address any urgent items identified by the care management provider and will communicate my actions to the patient's PCP. I have reviewed the patient's CCM visit with my supervising attending, Dr Raines.  Frieda Arnall, MD 09/24/2019    

## 2019-09-24 NOTE — Patient Instructions (Signed)
Visit Information It was nice meeting you.  Goals Addressed              This Visit's Progress     Patient Stated     " I don't have any scales and Dr. Radford Pax told me she would help me get a CPAP machine." (pt-stated)        CARE PLAN ENTRY (see longitudinal plan of care for additional care plan information)  Current Barriers:   Care Coordination needs related to DME  in a patient with IDDM, HF, HLD, HTN, CKD- met with patient during clinic visit to provide scales  Nurse Case Manager Clinical Goal(s):   Over the next 30-90  days, patient will work with CCM team to address needs related to home scales  and CPAP  Interventions:   Inter-disciplinary care team collaboration (see longitudinal plan of care)   Provided patient with scales and reminded him to weigh daily to assess for fluid retention  Advised patient that per notes from  Dr Theodosia Blender office she has ordered a split sleep study and that he should follow up with Dr Radford Pax re: sleep study and CPAP  Patient Self Care Activities:   Patient verbalizes understanding of plan to work with CCM team to secure DME of CPAP and scales  Self administers medications as prescribed  Attends all scheduled provider appointments  Calls pharmacy for medication refills  ADL's performed with PCS assist  IADL's performed with PCS assist  Calls provider office for new concerns or questions  Unable to independently secure scales and CPAP  Please see past updates related to this goal by clicking on the "Past Updates" button in the selected goal         The patient verbalized understanding of instructions provided today and declined a print copy of patient instruction materials.   The care management team will reach out to the patient again over the next 30-60 days.   Kelli Churn RN, CCM, [facility] RN Care Manager [phone]

## 2019-09-24 NOTE — Patient Instructions (Signed)
Mr Umair,  Sorry we ran out of time today! I am glad you are doing better, we will schedule you for a telehealth visit this week. Have a good day!  Sincerely,  [name], MD

## 2019-09-24 NOTE — Progress Notes (Signed)
   CC: Hospital Follow Up  HPI:  [name] is a 44 y.o. male, with a PMH noted below, who presents to the clinic for hospital follow up. To see the management of his acute and chronic issues, please see the A&P note under the encounter tab.   Past Medical History:  Diagnosis Date  . Acute venous embolism and thrombosis of deep vessels of proximal lower extremity ([place]) 07/19/2011  . Anemia   . Anginal pain ([place])    pt denies  . Anxiety   . Chest pain, neg MI, normal coronaries by cath 02/18/2013   pt denies  . CHF (congestive heart failure) ([place])   . CKD (chronic kidney disease) stage 3, GFR 30-59 ml/min 02/19/2013  . Colon polyps   . Depression   . Diabetic ulcer of right foot ([place])   . DVT (deep venous thrombosis) ([place]) 09/2002   patient reports additional DVTs in '06 & '11 (unconfirmed)  . ED (erectile dysfunction)   . GERD (gastroesophageal reflux disease)   . History of blood transfusion    "related to OR" (10/31/2016)  . Hyperlipidemia 02/19/2013  . Hypertension   . Nausea & vomiting    "constant for the last couple weeks" (10/31/2016)  . Nephrotic syndrome   . Obesity    BMI 44, weight 346 pounds 01/30/14  . OSA (obstructive sleep apnea) 02/28/2018   Mild obstructive sleep apnea with an AHI of 9.8/h but severe during REM sleep with an AHI of 33.8/h.  Oxygen saturations dropped to 86% and there was moderate snoring  . Peripheral edema   . Pneumonia   . Prosthesis adjustment 08/17/2016  . Pulmonary embolism ([place]) 09/2002   treated with 6 months of warfarin  . Pyelonephritis 02/02/2018  . Type I diabetes mellitus ([place]) dx'd 2001  . Urinary retention    Review of Systems:   Review of Systems  Constitutional: Negative for chills, fever, malaise/fatigue and weight loss.  Respiratory: Negative for sputum production, shortness of breath and wheezing.   Cardiovascular: Negative for chest pain, palpitations, orthopnea, claudication and leg swelling.  Gastrointestinal:  Negative for abdominal pain, constipation, diarrhea, nausea and vomiting.  Neurological: Negative for dizziness and headaches.     Physical Exam:  Vitals:   09/24/19 1330  BP: 129/75  Pulse: 96  Temp: 98.8 F (37.1 C)  TempSrc: Oral  SpO2: 98%  Height: 6\' 2"  (1.88 m)   Physical Exam Constitutional:      Appearance: Normal appearance. He is normal weight.  HENT:     Head: Normocephalic and atraumatic.  Cardiovascular:     Rate and Rhythm: Normal rate and regular rhythm.     Pulses: Normal pulses.     Heart sounds: Normal heart sounds. No murmur heard.  No friction rub. No gallop.   Pulmonary:     Effort: Pulmonary effort is normal. No respiratory distress.     Breath sounds: Normal breath sounds. No wheezing or rales.  Abdominal:     General: Abdomen is flat. Bowel sounds are normal.     Palpations: Abdomen is soft.     Tenderness: There is no abdominal tenderness. There is no guarding.  Neurological:     Mental Status: He is alert.     Assessment & Plan:   See Encounters Tab for problem based charting.  Patient discussed with Dr. Rebeca Alert

## 2019-09-24 NOTE — Chronic Care Management (AMB) (Signed)
Chronic Care Management   Follow Up Note   09/24/2019 Name: [name] MRN: [id number] DOB: 1975-09-15  Referred by: [name], MD Reason for referral : Chronic Care Management (IDDM, HF, HLD, HTN, CKD)   Alon Mazor is a 44 y.o. year old male who is a primary care patient of [name], MD. The CCM team was consulted for assistance with chronic disease management and care coordination needs.    Review of patient status, including review of consultants reports, relevant laboratory and other test results, and collaboration with appropriate care team members and the patient's provider was performed as part of comprehensive patient evaluation and provision of chronic care management services.    SDOH (Social Determinants of Health) assessments performed: No See Care Plan activities for detailed interventions related to [facility])     Outpatient Encounter Medications as of 09/24/2019  Medication Sig Note  . Accu-Chek Softclix Lancets lancets Check blood sugar up to 3 times a day (Patient taking differently: 1 each by Other route See admin instructions. Check blood sugar up to 4-5 times a day)   . acidophilus (RISAQUAD) CAPS capsule Take 1 capsule by mouth daily.   Marland Kitchen allopurinol (ZYLOPRIM) 100 MG tablet Take 2 tablets (200 mg total) by mouth daily.   . Alpha-Lipoic Acid 600 MG CAPS Take 600 mg by mouth daily.   Marland Kitchen amLODipine (NORVASC) 10 MG tablet Take 10 mg by mouth at bedtime.   . Blood Glucose Monitoring Suppl (ACCU-CHEK GUIDE ME) w/Device KIT Use to check blood sugar up to 3 times a day (Patient taking differently: 1 each by Other route See admin instructions. Use to check blood sugar up to 4-5 times a day)   . calcitRIOL (ROCALTROL) 0.5 MCG capsule Take 2.5 mcg by mouth daily.    . carvedilol (COREG) 25 MG tablet Take 1 tablet (25 mg total) by mouth 2 (two) times daily with a meal.   . cloNIDine (CATAPRES) 0.3 MG tablet Take 1 tablet (0.3 mg total) by mouth 2 (two) times daily.    . Cyanocobalamin (B-12) 500 MCG SUBL Place 500 mcg under the tongue daily.    Marland Kitchen ELIQUIS 5 MG TABS tablet Take 1 tablet by mouth twice daily (Patient taking differently: Take 5 mg by mouth 2 (two) times daily. )   . famotidine (PEPCID) 20 MG tablet Take 1 tablet (20 mg total) by mouth 2 (two) times daily. (Patient taking differently: Take 20 mg by mouth 2 (two) times daily as needed for heartburn or indigestion. ) 09/16/2019: States he takes prn  . furosemide (LASIX) 80 MG tablet Take 1 tablet (80 mg total) by mouth 2 (two) times daily. (Patient taking differently: Take 80-160 mg by mouth 2 (two) times daily. 166m in the morning and 875min the afternoon.)   . gabapentin (NEURONTIN) 100 MG capsule Take 1 capsule (100 mg total) by mouth 3 (three) times daily.   . Marland Kitchenlucose blood (ACCU-CHEK GUIDE) test strip Check blood sugar up to 3 times a day (Patient taking differently: 1 each by Other route See admin instructions. Check blood sugar up to 4-5 times a day)   . hydrALAZINE (APRESOLINE) 50 MG tablet Take 1 tablet by mouth 4 times daily (Patient taking differently: Take 50 mg by mouth 4 (four) times daily. )   . insulin aspart (NOVOLOG) 100 UNIT/ML FlexPen Inject 25 Units into the skin 3 (three) times daily with meals. 20 unit morning, 25 units lunch 30 units for dinner (Patient taking differently: Inject 20-30  Units into the skin See admin instructions. 20 units in the morning, 25 units in the afternoon and 30 units before bedtime Note: Doesn't use on mornings when he works out.)   . Insulin Pen Needle (B-D UF III MINI PEN NEEDLES) 31G X 5 MM MISC USE  4 TIMES DAILY (Patient taking differently: 1 each by Other route See admin instructions. USE 4-5 TIMES DAILY)   . Insulin Pen Needle (PEN NEEDLES 3/16") 31G X 5 MM MISC Use five times daily (Patient taking differently: 1 each by Other route See admin instructions. Use five times daily)   . Lancet Devices (ACCU-CHEK SOFTCLIX) lancets Use to check blood sugars 5  times a day. Dx code:E11.9. Insulin dependent. (Patient taking differently: 1 each by Other route See admin instructions. Use to check blood sugars 5 times a day. Dx code:E11.9. Insulin dependent.)   . lovastatin (MEVACOR) 20 MG tablet Take 1 tablet by mouth once daily (Patient taking differently: Take 20 mg by mouth at bedtime. )   . Melatonin 10 MG TABS Take 10 mg by mouth at bedtime as needed (sleep).   . metolazone (ZAROXOLYN) 2.5 MG tablet Take 2.5 mg by mouth every other day.   . Omega-3 Fatty Acids (FISH OIL) 1000 MG CAPS Take 1,000 mg by mouth at bedtime.    . ondansetron (ZOFRAN) 4 MG tablet Take 1 tablet (4 mg total) by mouth every 6 (six) hours as needed for nausea.   Marland Kitchen oxybutynin (DITROPAN) 5 MG tablet Take 5 mg by mouth 3 (three) times daily.    . polyethylene glycol (MIRALAX / GLYCOLAX) 17 g packet Take 17 g by mouth 2 (two) times daily. (Patient taking differently: Take 17 g by mouth daily as needed. )   . senna-docusate (SENOKOT-S) 8.6-50 MG tablet Take 2 tablets by mouth 2 (two) times daily. (Patient taking differently: Take 2 tablets by mouth at bedtime as needed. )   . tamsulosin (FLOMAX) 0.4 MG CAPS capsule Take 0.4 mg by mouth at bedtime.    . TRESIBA FLEXTOUCH 200 UNIT/ML FlexTouch Pen INJECT 70 UNITS SUBCUTANEOUSLY ONCE DAILY (Patient taking differently: 96 Units. )   . VICTOZA 18 MG/3ML SOPN INJECT 1.2 MG (0.2 MLS) INTO THE SKIN ONCE DAILY AT THE SAME TIME. (Patient taking differently: Inject 1.2 mg into the skin See admin instructions. INJECT 1.2 MG (0.2 MLS) INTO THE SKIN ONCE DAILY AT THE SAME TIME.)    No facility-administered encounter medications on file as of 09/24/2019.     Objective:  Wt Readings from Last 3 Encounters:  09/14/19 (!) 332 lb 14.3 oz (151 kg)  09/02/19 (!) 368 lb 2.7 oz (167 kg)  07/30/19 (!) 366 lb 6.5 oz (166.2 kg)   BP Readings from Last 3 Encounters:  09/24/19 129/75  09/14/19 108/60  09/02/19 (!) 165/62   Lab Results  Component Value  Date   CHOL 122 11/14/2018   HDL 34 (L) 11/14/2018   LDLCALC 64 11/14/2018   TRIG 135 11/14/2018   CHOLHDL 3.6 11/14/2018  Please consider updating lipid panel as pt has IDDM and is on statin  Goals Addressed              This Visit's Progress     Patient Stated   .  " I don't have any scales and Dr. Radford Pax told me she would help me get a CPAP machine." (pt-stated)        CARE PLAN ENTRY (see longitudinal plan of care for additional care plan  information)  Current Barriers:  . Care Coordination needs related to DME  in a patient with IDDM, HF, HLD, HTN, CKD- met with patient during clinic visit to provide scales  Nurse Case Manager Clinical Goal(s):  Marland Kitchen Over the next 30-90  days, patient will work with CCM team to address needs related to home scales  and CPAP  Interventions:  . Inter-disciplinary care team collaboration (see longitudinal plan of care)  . Provided patient with scales and reminded him to weigh daily to assess for fluid retention . Advised patient that per notes from  Dr Theodosia Blender office she has ordered a split sleep study and that he should follow up with Dr Radford Pax re: sleep study and CPAP  Patient Self Care Activities:  . Patient verbalizes understanding of plan to work with CCM team to secure DME of CPAP and scales . Self administers medications as prescribed . Attends all scheduled provider appointments . Calls pharmacy for medication refills . ADL's performed with PCS assist . IADL's performed with PCS assist . Calls provider office for new concerns or questions . Unable to independently secure scales and CPAP  Please see past updates related to this goal by clicking on the "Past Updates" button in the selected goal          Plan:   The care management team will reach out to the patient again over the next 30-60 days.    Kelli Churn RN, CCM, [facility] RN Care Manager [phone]

## 2019-09-26 ENCOUNTER — Telehealth: Payer: Self-pay | Admitting: *Deleted

## 2019-09-26 ENCOUNTER — Encounter: Payer: Self-pay | Admitting: Internal Medicine

## 2019-09-26 DIAGNOSIS — G4733 Obstructive sleep apnea (adult) (pediatric): Secondary | ICD-10-CM

## 2019-09-26 MED ORDER — SENNOSIDES-DOCUSATE SODIUM 8.6-50 MG PO TABS: 1.0000 | ORAL_TABLET | Freq: Every day | ORAL | 3 refills | Status: DC

## 2019-09-26 NOTE — Telephone Encounter (Signed)
[name], CMA  [name], CMA Ok to schedule split night sleep study. Patient has Medicare. No PA is required.        Patient is scheduled for lab study on 10/29/19. Patient understands his sleep study will be done at [facility] sleep lab. Patient understands he will receive a sleep packet in a week or so. Patient understands to call if he does not receive the sleep packet in a timely manner. Patient agrees with treatment and thanked me for call.

## 2019-09-26 NOTE — Assessment & Plan Note (Addendum)
Patient presents to the clinic today for follow up from his recent hospitalization for constipation. Patient states that he is doing well. His abdominal pain has alleviated. He states that he is only having one BM weekly, but admits to poor appetite. He states that he is taking his Miralax as indicated. He has not received his Senakot after his discharge, but is requesting it be sent to his pharmacy.  - Senakot-S ordered

## 2019-09-26 NOTE — Telephone Encounter (Signed)
-----   Message from [name], [place] sent at 09/19/2019  4:59 PM EDT ----- Regarding: RE: precert Ok to schedule split night sleep study. Patient has Medicare. No PA is required. ----- Message ----- From: [name], CMA Sent: 09/19/2019   4:11 PM EDT To: [facility] Subject: precert                                        Split night

## 2019-09-30 NOTE — Progress Notes (Signed)
Internal Medicine Clinic Attending  CCM services provided by the care management provider and their documentation were reviewed with Dr. Winters.  We reviewed the pertinent findings, urgent action items addressed by the resident and non-urgent items to be addressed by the PCP.  I agree with the assessment, diagnosis, and plan of care documented in the CCM and resident's note.  Cristian Davitt N Zaul Hubers, MD 09/30/2019 

## 2019-10-01 ENCOUNTER — Other Ambulatory Visit: Payer: Self-pay | Admitting: Endocrinology

## 2019-10-01 ENCOUNTER — Encounter: Payer: Self-pay | Admitting: Internal Medicine

## 2019-10-01 ENCOUNTER — Other Ambulatory Visit: Payer: Self-pay

## 2019-10-01 ENCOUNTER — Ambulatory Visit (INDEPENDENT_AMBULATORY_CARE_PROVIDER_SITE_OTHER): Payer: Medicare Other | Admitting: Internal Medicine

## 2019-10-01 DIAGNOSIS — K5909 Other constipation: Secondary | ICD-10-CM

## 2019-10-01 DIAGNOSIS — Z89611 Acquired absence of right leg above knee: Secondary | ICD-10-CM

## 2019-10-01 DIAGNOSIS — N529 Male erectile dysfunction, unspecified: Secondary | ICD-10-CM | POA: Diagnosis not present

## 2019-10-01 DIAGNOSIS — K5900 Constipation, unspecified: Secondary | ICD-10-CM | POA: Diagnosis not present

## 2019-10-01 NOTE — Patient Instructions (Addendum)
[name],  It was a pleasure seeing you again! Today we worked on your paper work. I will reorder the additional laxative for you, It is called Senaokot-S. Please take it as directed. I will see you in three months time. If the additional laxative is not helping, please reach out to our clinic. Have a good day!  Sincerely,  [name], MD

## 2019-10-01 NOTE — Progress Notes (Signed)
Internal Medicine Clinic Attending  Case discussed with Dr. Winters at the time of the visit.  We reviewed the resident's history and exam and pertinent patient test results.  I agree with the assessment, diagnosis, and plan of care documented in the resident's note.  Falon Huesca, M.D., Ph.D.  

## 2019-10-01 NOTE — Progress Notes (Signed)
   CC: Follow up forms  HPI:  [name] is a 44 y.o. male, with a PMH noted below, who presents to the clinic for a follow up on updating his form. To see the management of their acute and chronic conditions, please see the A&P note under the Encounters tab.   Past Medical History:  Diagnosis Date  . Acute venous embolism and thrombosis of deep vessels of proximal lower extremity ([place]) 07/19/2011  . Anemia   . Anginal pain ([place])    pt denies  . Anxiety   . Chest pain, neg MI, normal coronaries by cath 02/18/2013   pt denies  . CHF (congestive heart failure) ([place])   . CKD (chronic kidney disease) stage 3, GFR 30-59 ml/min 02/19/2013  . Colon polyps   . Depression   . Diabetic ulcer of right foot ([place])   . DVT (deep venous thrombosis) ([place]) 09/2002   patient reports additional DVTs in '06 & '11 (unconfirmed)  . ED (erectile dysfunction)   . GERD (gastroesophageal reflux disease)   . History of blood transfusion    "related to OR" (10/31/2016)  . Hyperlipidemia 02/19/2013  . Hypertension   . Nausea & vomiting    "constant for the last couple weeks" (10/31/2016)  . Nephrotic syndrome   . Obesity    BMI 44, weight 346 pounds 01/30/14  . OSA (obstructive sleep apnea) 02/28/2018   Mild obstructive sleep apnea with an AHI of 9.8/h but severe during REM sleep with an AHI of 33.8/h.  Oxygen saturations dropped to 86% and there was moderate snoring  . Peripheral edema   . Pneumonia   . Prosthesis adjustment 08/17/2016  . Pulmonary embolism ([place]) 09/2002   treated with 6 months of warfarin  . Pyelonephritis 02/02/2018  . Type I diabetes mellitus ([place]) dx'd 2001  . Urinary retention    Review of Systems:   Review of Systems  Constitutional: Negative for chills, fever, malaise/fatigue and weight loss.  Gastrointestinal: Positive for constipation. Negative for abdominal pain, diarrhea, nausea and vomiting.  Neurological: Negative for dizziness and headaches.    Physical  Exam:  Vitals:   10/01/19 1544  BP: 126/74  Pulse: 77  Temp: 97.8 F (36.6 C)  TempSrc: Oral  SpO2: 100%  Weight: (!) 352 lb (159.7 kg)  Height: 6\' 2"  (1.88 m)   Physical Exam Constitutional:      General: He is not in acute distress.    Appearance: He is obese. He is not ill-appearing, toxic-appearing or diaphoretic.  HENT:     Head: Normocephalic and atraumatic.  Cardiovascular:     Rate and Rhythm: Normal rate and regular rhythm.  Pulmonary:     Effort: Pulmonary effort is normal.  Skin:    General: Skin is warm.  Neurological:     Mental Status: He is oriented to person, place, and time.  Psychiatric:        Mood and Affect: Mood normal.        Behavior: Behavior normal.     Assessment & Plan:   See Encounters Tab for problem based charting.  Patient discussed with Dr. Daryll Drown

## 2019-10-03 ENCOUNTER — Other Ambulatory Visit: Payer: Self-pay

## 2019-10-03 ENCOUNTER — Encounter: Payer: Self-pay | Admitting: Internal Medicine

## 2019-10-03 DIAGNOSIS — N183 Chronic kidney disease, stage 3 unspecified: Secondary | ICD-10-CM

## 2019-10-03 NOTE — Assessment & Plan Note (Signed)
Patient states that his pharmacy has not called him in the Senokot S prescribed at last visit. We looked through his chart, and the medication in question was sent in at his last visit. Instructed patient to pick up his medication.

## 2019-10-03 NOTE — Assessment & Plan Note (Signed)
Patient presenting for erectile dysfunction. Has been worked up in the past by Dr. Heber Kearny. Will refer back to urology. Patient states that he will call urology to make an appointment. Patient called urology in the room.  - Refer to urology.

## 2019-10-03 NOTE — Assessment & Plan Note (Signed)
Patient's sleeve no longer fits, will place DME order for sleeve fitting.

## 2019-10-06 ENCOUNTER — Ambulatory Visit (HOSPITAL_COMMUNITY)
Admission: RE | Admit: 2019-10-06 | Discharge: 2019-10-06 | Disposition: A | Discharge status: Home / Self Care | Payer: Medicare Other | Source: Ambulatory Visit | Attending: Nephrology | Admitting: Nephrology

## 2019-10-06 ENCOUNTER — Telehealth: Payer: Self-pay | Admitting: *Deleted

## 2019-10-06 ENCOUNTER — Other Ambulatory Visit: Payer: Self-pay

## 2019-10-06 VITALS — BP 133/70

## 2019-10-06 DIAGNOSIS — N183 Chronic kidney disease, stage 3 unspecified: Secondary | ICD-10-CM | POA: Insufficient documentation

## 2019-10-06 DIAGNOSIS — D631 Anemia in chronic kidney disease: Secondary | ICD-10-CM | POA: Diagnosis not present

## 2019-10-06 LAB — RENAL FUNCTION PANEL
Albumin: 3.2 g/dL — ABNORMAL LOW (ref 3.5–5.0)
Anion gap: 14 (ref 5–15)
BUN: 119 mg/dL — ABNORMAL HIGH (ref 6–20)
CO2: 26 mmol/L (ref 22–32)
Calcium: 9.6 mg/dL (ref 8.9–10.3)
Chloride: 94 mmol/L — ABNORMAL LOW (ref 98–111)
Creatinine, Ser: 4.75 mg/dL — ABNORMAL HIGH (ref 0.61–1.24)
GFR calc Af Amer: 16 mL/min — ABNORMAL LOW (ref >60)
GFR calc non Af Amer: 14 mL/min — ABNORMAL LOW (ref >60)
Glucose, Bld: 174 mg/dL — ABNORMAL HIGH (ref 70–99)
Phosphorus: 4.3 mg/dL (ref 2.5–4.6)
Potassium: 2.6 mmol/L — CL (ref 3.5–5.1)
Sodium: 134 mmol/L — ABNORMAL LOW (ref 135–145)

## 2019-10-06 LAB — IRON AND TIBC
Iron: 50 ug/dL (ref 45–182)
Saturation Ratios: 19 % (ref 17.9–39.5)
TIBC: 270 ug/dL (ref 250–450)
UIBC: 220 ug/dL

## 2019-10-06 LAB — FERRITIN: Ferritin: 509 ng/mL — ABNORMAL HIGH (ref 24–336)

## 2019-10-06 LAB — POCT HEMOGLOBIN-HEMACUE: Hemoglobin: 9 g/dL — ABNORMAL LOW (ref 13.0–17.0)

## 2019-10-06 MED ORDER — DARBEPOETIN ALFA 100 MCG/0.5ML IJ SOSY
PREFILLED_SYRINGE | INTRAMUSCULAR | Status: AC
  Filled 2019-10-06: qty 0.5

## 2019-10-06 MED ORDER — DARBEPOETIN ALFA 100 MCG/0.5ML IJ SOSY
100.0000 ug | PREFILLED_SYRINGE | INTRAMUSCULAR | Status: DC
  Administered 2019-10-06: 100 ug via SUBCUTANEOUS

## 2019-10-06 NOTE — Telephone Encounter (Signed)
Order for Prosthetic sleeve, along with demographics, and OV note from 10/01/2019 faxed to [place] at [facility] at [phone]. Fax confirmation receipt received. Hubbard Hartshorn, BSN, RN-BC

## 2019-10-06 NOTE — Progress Notes (Signed)
Pt here for Aranesp injection and labs today.  Lab called after pt left with a critical potassium level of 2.6.  Angie at [facility] notified of results

## 2019-10-07 LAB — PTH, INTACT AND CALCIUM
Calcium, Total (PTH): 9.5 mg/dL (ref 8.7–10.2)
PTH: 27 pg/mL (ref 15–65)

## 2019-10-07 NOTE — Progress Notes (Signed)
Internal Medicine Clinic Attending ? ?Case discussed with Dr. Winters  At the time of the visit.  We reviewed the resident?s history and exam and pertinent patient test results.  I agree with the assessment, diagnosis, and plan of care documented in the resident?s note.  ?

## 2019-10-09 DIAGNOSIS — N185 Chronic kidney disease, stage 5: Secondary | ICD-10-CM | POA: Diagnosis not present

## 2019-10-09 DIAGNOSIS — N2581 Secondary hyperparathyroidism of renal origin: Secondary | ICD-10-CM | POA: Diagnosis not present

## 2019-10-09 DIAGNOSIS — E669 Obesity, unspecified: Secondary | ICD-10-CM | POA: Diagnosis not present

## 2019-10-09 DIAGNOSIS — I120 Hypertensive chronic kidney disease with stage 5 chronic kidney disease or end stage renal disease: Secondary | ICD-10-CM | POA: Diagnosis not present

## 2019-10-09 DIAGNOSIS — E876 Hypokalemia: Secondary | ICD-10-CM | POA: Diagnosis not present

## 2019-10-17 ENCOUNTER — Ambulatory Visit (INDEPENDENT_AMBULATORY_CARE_PROVIDER_SITE_OTHER): Payer: Medicare Other | Admitting: Vascular Surgery

## 2019-10-17 ENCOUNTER — Encounter: Payer: Self-pay | Admitting: Vascular Surgery

## 2019-10-17 ENCOUNTER — Other Ambulatory Visit: Payer: Self-pay

## 2019-10-17 ENCOUNTER — Ambulatory Visit (HOSPITAL_COMMUNITY)
Admission: RE | Admit: 2019-10-17 | Discharge: 2019-10-17 | Disposition: A | Discharge status: Home / Self Care | Payer: Medicare Other | Source: Ambulatory Visit | Attending: Vascular Surgery | Admitting: Vascular Surgery

## 2019-10-17 ENCOUNTER — Ambulatory Visit (INDEPENDENT_AMBULATORY_CARE_PROVIDER_SITE_OTHER)
Admission: RE | Admit: 2019-10-17 | Discharge: 2019-10-17 | Disposition: A | Discharge status: Home / Self Care | Payer: Medicare Other | Source: Ambulatory Visit | Attending: Vascular Surgery | Admitting: Vascular Surgery

## 2019-10-17 VITALS — BP 144/83 | HR 87 | Temp 98.8°F | Resp 20 | Ht 74.0 in | Wt 352.0 lb

## 2019-10-17 DIAGNOSIS — N183 Chronic kidney disease, stage 3 unspecified: Secondary | ICD-10-CM | POA: Diagnosis not present

## 2019-10-17 NOTE — Progress Notes (Signed)
Patient ID: [name], male   DOB: 29-Dec-1975, 44 y.o.   MRN: [id number]  Reason for Consult: New Patient (Initial Visit)   Referred by [name], MD  Subjective:     HPI:  [name] is a 44 y.o. male with chronic kidney disease.  He has never been on dialysis in the past.  He does have a right lower extremity amputation.  He is attempting to lose weight to get Bmi down to be considered for transplant in the future.  He is right-hand dominant denies any history of arm chest or breast surgery in the past.  He does not take blood thinners.  Past Medical History:  Diagnosis Date  . Acute venous embolism and thrombosis of deep vessels of proximal lower extremity ([place]) 07/19/2011  . Anemia   . Anginal pain ([place])    pt denies  . Anxiety   . Chest pain, neg MI, normal coronaries by cath 02/18/2013   pt denies  . CHF (congestive heart failure) ([place])   . CKD (chronic kidney disease) stage 3, GFR 30-59 ml/min ([facility]) 02/19/2013  . Colon polyps   . Depression   . Diabetic ulcer of right foot ([place])   . DVT (deep venous thrombosis) ([place]) 09/2002   patient reports additional DVTs in '06 & '11 (unconfirmed)  . ED (erectile dysfunction)   . GERD (gastroesophageal reflux disease)   . History of blood transfusion    "related to OR" (10/31/2016)  . Hyperlipidemia 02/19/2013  . Hypertension   . Nausea & vomiting    "constant for the last couple weeks" (10/31/2016)  . Nephrotic syndrome   . Obesity    BMI 44, weight 346 pounds 01/30/14  . OSA (obstructive sleep apnea) 02/28/2018   Mild obstructive sleep apnea with an AHI of 9.8/h but severe during REM sleep with an AHI of 33.8/h.  Oxygen saturations dropped to 86% and there was moderate snoring  . Peripheral edema   . Pneumonia   . Prosthesis adjustment 08/17/2016  . Pulmonary embolism ([place]) 09/2002   treated with 6 months of warfarin  . Pyelonephritis 02/02/2018  . Type I diabetes mellitus ([place]) dx'd 2001  . Urinary retention     Family History  Problem Relation Age of Onset  . Heart disease Mother   . Diabetes Mother   . Diabetes Maternal Uncle   . Diabetes Cousin    Past Surgical History:  Procedure Laterality Date  . AMPUTATION Right 12/22/2013   Procedure: AMPUTATION BELOW KNEE;  Surgeon: [name], MD;  Location: [place];  Service: Orthopedics;  Laterality: Right;  . AMPUTATION Right 02/19/2014   Procedure: RIGHT ABOVE KNEE AMPUTATION ;  Surgeon: [name], MD;  Location: [place];  Service: Orthopedics;  Laterality: Right;  . blood clot removal  2016  . CARDIAC CATHETERIZATION  02/18/2013   normal coronaries  . ESOPHAGOGASTRODUODENOSCOPY N/A 11/02/2016   Procedure: ESOPHAGOGASTRODUODENOSCOPY (EGD);  Surgeon: [name], MD;  Location: [facility] ENDOSCOPY;  Service: Endoscopy;  Laterality: N/A;  . I & D EXTREMITY Right 12/14/2013   Procedure: IRRIGATION AND DEBRIDEMENT RIGHT FOOT;  Surgeon: [name], MD;  Location: [place];  Service: Orthopedics;  Laterality: Right;  . INCISION AND DRAINAGE ABSCESS  2007; 2015   "back"  . INCISION AND DRAINAGE OF WOUND Right 12/22/2013   Procedure: I&D RIGHT BUTTOCK;  Surgeon: [name], MD;  Location: [place];  Service: Orthopedics;  Laterality: Right;  . LEFT HEART CATHETERIZATION WITH CORONARY ANGIOGRAM N/A 02/18/2013  Procedure: LEFT HEART CATHETERIZATION WITH CORONARY ANGIOGRAM;  Surgeon: [name], MD;  Location: [facility] CATH LAB;  Service: Cardiovascular;  Laterality: N/A;    Short Social History:  Social History   Tobacco Use  . Smoking status: Never Smoker  . Smokeless tobacco: Never Used  Substance Use Topics  . Alcohol use: Yes    Comment: 10/31/2016 "a few beers q 6 months or so"    Allergies  Allergen Reactions  . Reglan [Metoclopramide] Other (See Comments)    Dysphoric reaction  . Metformin And Related Diarrhea  . Ozempic (0.25 Or 0.5 Mg-Dose) [Semaglutide(0.25 Or 0.78m-Dos)] Nausea And Vomiting    Current Outpatient Medications   Medication Sig Dispense Refill  . Accu-Chek Softclix Lancets lancets Check blood sugar up to 3 times a day (Patient taking differently: 1 each by Other route See admin instructions. Check blood sugar up to 4-5 times a day) 300 each 3  . acidophilus (RISAQUAD) CAPS capsule Take 1 capsule by mouth daily.    .Marland Kitchenallopurinol (ZYLOPRIM) 100 MG tablet Take 2 tablets (200 mg total) by mouth daily. 180 tablet 3  . Alpha-Lipoic Acid 600 MG CAPS Take 600 mg by mouth daily.    .Marland KitchenamLODipine (NORVASC) 10 MG tablet Take 10 mg by mouth at bedtime.    . Blood Glucose Monitoring Suppl (ACCU-CHEK GUIDE ME) w/Device KIT Use to check blood sugar up to 3 times a day (Patient taking differently: 1 each by Other route See admin instructions. Use to check blood sugar up to 4-5 times a day) 1 kit 1  . calcitRIOL (ROCALTROL) 0.5 MCG capsule Take 2.5 mcg by mouth daily.     . cloNIDine (CATAPRES) 0.3 MG tablet Take 1 tablet (0.3 mg total) by mouth 2 (two) times daily. 180 tablet 1  . Cyanocobalamin (B-12) 500 MCG SUBL Place 500 mcg under the tongue daily.     .Marland KitchenELIQUIS 5 MG TABS tablet Take 1 tablet by mouth twice daily (Patient taking differently: Take 5 mg by mouth 2 (two) times daily. ) 180 tablet 0  . famotidine (PEPCID) 20 MG tablet Take 1 tablet (20 mg total) by mouth 2 (two) times daily. (Patient taking differently: Take 20 mg by mouth 2 (two) times daily as needed for heartburn or indigestion. ) 30 tablet 0  . furosemide (LASIX) 80 MG tablet Take 1 tablet (80 mg total) by mouth 2 (two) times daily. (Patient taking differently: Take 80-160 mg by mouth 2 (two) times daily. 1615min the morning and 8073mn the afternoon.) 180 tablet 0  . gabapentin (NEURONTIN) 100 MG capsule Take 1 capsule (100 mg total) by mouth 3 (three) times daily. 270 capsule 1  . glucose blood (ACCU-CHEK GUIDE) test strip Check blood sugar up to 3 times a day (Patient taking differently: 1 each by Other route See admin instructions. Check blood sugar  up to 4-5 times a day) 300 each 3  . hydrALAZINE (APRESOLINE) 50 MG tablet Take 1 tablet by mouth 4 times daily (Patient taking differently: Take 50 mg by mouth 4 (four) times daily. ) 360 tablet 0  . insulin aspart (NOVOLOG) 100 UNIT/ML FlexPen Inject 25 Units into the skin 3 (three) times daily with meals. 20 unit morning, 25 units lunch 30 units for dinner (Patient taking differently: Inject 20-30 Units into the skin See admin instructions. 20 units in the morning, 25 units in the afternoon and 30 units before bedtime Note: Doesn't use on mornings when he works out.) 8  pen 11  . Insulin Pen Needle (B-D UF III MINI PEN NEEDLES) 31G X 5 MM MISC USE  4 TIMES DAILY (Patient taking differently: 1 each by Other route See admin instructions. USE 4-5 TIMES DAILY) 200 each 7  . Insulin Pen Needle (PEN NEEDLES 3/16") 31G X 5 MM MISC Use five times daily (Patient taking differently: 1 each by Other route See admin instructions. Use five times daily) 390 each 3  . Lancet Devices (ACCU-CHEK SOFTCLIX) lancets Use to check blood sugars 5 times a day. Dx code:E11.9. Insulin dependent. (Patient taking differently: 1 each by Other route See admin instructions. Use to check blood sugars 5 times a day. Dx code:E11.9. Insulin dependent.) 6 each 3  . lovastatin (MEVACOR) 20 MG tablet Take 1 tablet by mouth once daily (Patient taking differently: Take 20 mg by mouth at bedtime. ) 90 tablet 0  . Melatonin 10 MG TABS Take 10 mg by mouth at bedtime as needed (sleep).    . metolazone (ZAROXOLYN) 2.5 MG tablet Take 2.5 mg by mouth every other day.    . Omega-3 Fatty Acids (FISH OIL) 1000 MG CAPS Take 1,000 mg by mouth at bedtime.     . ondansetron (ZOFRAN) 4 MG tablet Take 1 tablet (4 mg total) by mouth every 6 (six) hours as needed for nausea. 20 tablet 0  . oxybutynin (DITROPAN) 5 MG tablet Take 5 mg by mouth 3 (three) times daily.     . polyethylene glycol (MIRALAX / GLYCOLAX) 17 g packet Take 17 g by mouth 2 (two) times  daily. (Patient taking differently: Take 17 g by mouth daily as needed. ) 14 each 0  . senna-docusate (SENOKOT-S) 8.6-50 MG tablet Take 1 tablet by mouth daily. 90 tablet 3  . tamsulosin (FLOMAX) 0.4 MG CAPS capsule Take 0.4 mg by mouth at bedtime.     . TRESIBA FLEXTOUCH 200 UNIT/ML FlexTouch Pen INJECT 70 UNITS SUBCUTANEOUSLY ONCE DAILY (Patient taking differently: 96 Units. ) 45 mL 1  . VICTOZA 18 MG/3ML SOPN INJECT 1.2 MG SUBCUTANEOUSLY ONCE DAILY AT  THE  SAME  TIME 6 mL 0  . carvedilol (COREG) 25 MG tablet Take 1 tablet (25 mg total) by mouth 2 (two) times daily with a meal. 180 tablet 1   No current facility-administered medications for this visit.    Review of Systems  Constitutional:  Constitutional negative. HENT: HENT negative.  Eyes: Eyes negative.  Respiratory: Respiratory negative.  GI: Gastrointestinal negative.  Musculoskeletal: Musculoskeletal negative.  Skin: Skin negative.  Neurological: Neurological negative. Hematologic: Hematologic/lymphatic negative.  Psychiatric: Psychiatric negative.        Objective:  Objective   Vitals:   10/17/19 0829  BP: (!) 144/83  Pulse: 87  Resp: 20  Temp: 98.8 F (37.1 C)  SpO2: 94%    Physical Exam HENT:     Nose:     Comments: Wearing a mask Eyes:     Pupils: Pupils are equal, round, and reactive to light.  Cardiovascular:     Pulses:          Radial pulses are 2+ on the right side and 2+ on the left side.  Pulmonary:     Effort: Pulmonary effort is normal.  Abdominal:     General: Abdomen is flat.     Palpations: Abdomen is soft.  Musculoskeletal:        General: Normal range of motion.  Skin:    General: Skin is warm and dry.  Capillary Refill: Capillary refill takes less than 2 seconds.  Neurological:     General: No focal deficit present.     Mental Status: He is alert.  Psychiatric:        Mood and Affect: Mood normal.        Behavior: Behavior normal.        Thought Content: Thought content  normal.        Judgment: Judgment normal.     Data: I have independent interpreted his upper extremity arterial duplex to be triphasic waveforms bilaterally.  Brachial artery on the right 0.57 cm on the left 0.66 cm.  I have independently interpreted his upper extremity vein mapping.  He appears to have suitable right and left cephalic veins for dialysis access creation.      Assessment/Plan:     44yo male with chronic kidney disease.  He appears to have suitable cephalic vein on his nondominant left upper extremity.  I discussed with him the risk and benefits including possible need for graft possible need for basilic vein fistula which would require 2 procedures.  He demonstrates good understanding.  He is on blood thinners this will need to be held for 48 hours prior to procedure and to be restarted the next day.  At this time he wants to speak with his nephrologist further and he will call to schedule when he is ready.     Waynetta Sandy MD [facility]

## 2019-10-22 ENCOUNTER — Other Ambulatory Visit: Payer: Self-pay | Admitting: Endocrinology

## 2019-10-24 ENCOUNTER — Ambulatory Visit: Payer: Medicare Other | Admitting: *Deleted

## 2019-10-24 DIAGNOSIS — Z89611 Acquired absence of right leg above knee: Secondary | ICD-10-CM

## 2019-10-24 DIAGNOSIS — I5032 Chronic diastolic (congestive) heart failure: Secondary | ICD-10-CM

## 2019-10-24 DIAGNOSIS — E119 Type 2 diabetes mellitus without complications: Secondary | ICD-10-CM

## 2019-10-24 DIAGNOSIS — Z794 Long term (current) use of insulin: Secondary | ICD-10-CM

## 2019-10-24 DIAGNOSIS — E785 Hyperlipidemia, unspecified: Secondary | ICD-10-CM

## 2019-10-24 DIAGNOSIS — N183 Chronic kidney disease, stage 3 unspecified: Secondary | ICD-10-CM

## 2019-10-24 DIAGNOSIS — I10 Essential (primary) hypertension: Secondary | ICD-10-CM

## 2019-10-24 NOTE — Patient Instructions (Signed)
Visit Information It was nice speaking with you today. Keep up the good work on your goal to lose weight. Goals Addressed              This Visit's Progress     Patient Stated   .  " I don't have any scales and Dr. Radford Pax told me she would help me get a CPAP machine." (pt-stated)        CARE PLAN ENTRY (see longitudinal plan of care for additional care plan information)  Current Barriers:  . Care Coordination needs related to DME  in a patient with IDDM, HF, HLD, HTN, CKD- spoke with patient states he has sleep study scheduled for 10/20 in preparation for getting CPAP  Nurse Case Manager Clinical Goal(s):  Marland Kitchen Over the next 30-90  days, patient will work with CCM team to address needs related to home scales  and CPAP  Interventions:  . Inter-disciplinary care team collaboration (see longitudinal plan of care)  . 09/23/20 Provided patient with scales and reminded him to weigh daily to assess for fluid retention . Verified that patient has sleep study scheduled  Patient Self Care Activities:  . Patient verbalizes understanding of plan to work with CCM team to secure DME of CPAP and scales . Self administers medications as prescribed . Attends all scheduled provider appointments . Calls pharmacy for medication refills . ADL's performed with PCS assist . IADL's performed with PCS assist . Calls provider office for new concerns or questions . Unable to independently secure scales and CPAP  Please see past updates related to this goal by clicking on the "Past Updates" button in the selected goal      .  " I want to get my BMI under 35 so I am eligible for renal transplant should I need it." (pt-stated)        CARE PLAN ENTRY (see longitudinal plan of care for additional care plan information)  Current Barriers:  Marland Kitchen Knowledge Deficits related to weight loss strategies as evidenced by weight gain reported/observed and/or altered eating habits . Chronic Disease Management support and  education needs related to weight management in patient with CHF, HTN, HLD, DMII, and CKD Stage 3, s/p right AKA . Care Coordination needs related to assisting patient  with weight loss in a patient with IDDM, HF, HLD, HTN, CKD, s/p R AKA  Nurse Case Manager Clinical Goal(s):  Marland Kitchen Over the next 30-60 days, patient will verbalize basic understanding of plan for initiation of weight loss strategies . Over the next 30-60  days, patient will demonstrate improved health management independence as evidenced by exercising as advised by provider (describe) 3-6 times weekly and following heart healthy diet per his clinic dietician . Over the next 30-60 days, patient will work with health care team and fitness team to address needs related to ongoing weight loss  Interventions:  . Evaluation of current treatment plan related to weight loss and patient's adherence to plan as established by provider . Provided verbal and/or written education to patient re: recommended life style changes: avoid fad diets, make small/incremental dietary and exercise changes, eat at the table and avoid eating in front of the TV, plan management of cravings, monitor snacking and cravings in food diary . Discussed plans with patient for ongoing care management follow up and provided patient with direct contact information for care management team . Reviewed scheduled/upcoming provider appointments including: sleep study on 10/29/19 and [employer] for prosthetic sleeve fitting  Patient Self Care  Activities:  . Patient verbalizes understanding of plan to work with health care team and fitness team to achieve desired weight loss to meet goal of BMI<35 . Self administers medications as prescribed . Attends all scheduled provider appointments . Calls pharmacy for medication refills . Calls provider office for new concerns or questions . Unable to independently meet weight loss goal of BMI <35  Initial goal documentation     .  "I want  to improve my kidney health." (pt-stated)        CARE PLAN ENTRY (see longitudinal plan of care for additional care plan information)  Current Barriers:  . Chronic Disease Management support, education, and care coordination needs related to CHF, HTN, HLD, DMII, and CKD Stage 3 - spoke with patient to complete follow up assessment, he says he was referred by his nephrologist to Dr Donzetta Matters to discuss vascular access procedure, patient states his ultimate goal is to get his BMI to <35 so he might qualify for a renal transplant, he voiced great appreciation for the electronic scales that were given to him by this CCM RN on 09/24/19, states "they are the best thong since sliced bread", he remains  motivated to improve his kidney health, he says he follows a low CHO, healthy protein diet, exercises 6 days a week at a gym (despite right AKA) for 60 minute sessions that include cardio and strengthening and working with a Physiological scientist, he says he has an appointment with [employer] this month to have his prosthetic sleeve refitted due to his weight loss, says he uses a walker and/or Rolator to ambulate, denies falls  Clinical Goal(s) related to CHF, HTN, HLD, DMII, and CKD Stage 3 :  Over the next 30-60 days, patient will:  . Work with the care management team to address educational, disease management, and care coordination needs  . Begin or continue self health monitoring activities as directed today Measure and record CBG (blood glucose) 1-3 times daily, Measure and record blood pressure 4-5 times per week, and voice knowledge of strategies to improve kidney function . Call provider office for new or worsened signs and symptoms Blood glucose findings outside established parameters, Blood pressure findings outside established parameters, Chest pain, Shortness of breath, and New or worsened symptom related to IDDM, HTN , CKD  . Call care management team with questions or concerns . Verbalize basic understanding of  patient centered plan of care established today  Interventions related to CHF, HTN, HLD, DMII, and CKD Stage 3 :  . Prior to interviewing patient, reviewed patient status, including review of consultants reports, relevant laboratory and other test results, and medications, completed. . Evaluation of current treatment plans and patient's adherence to plan as established by provider . Assessed patient understanding of disease states . Assessed patient's education and care coordination needs . Congratulate patient on his significant weight loss and improvement in his Hgb A1C to 5.9% on 08/30/19 . Collaborated with appropriate clinical care team members regarding patient needs  Patient Self Care Activities related to CHF, HTN, DMII, and CKD Stage 3 :  . Patient is unable to independently self-manage chronic health conditions  Please see past updates related to this goal by clicking on the "Past Updates" button in the selected goal           Other   .  Blood Pressure < 140/90        BP Readings from Last 3 Encounters:  10/17/19 (!) 144/83  10/06/19 133/70  10/01/19  126/74    Not meeting blood pressure targets    .  HEMOGLOBIN A1C < 7         Lab Results  Component Value Date   HGBA1C 5.9 (H) 08/30/2019   HGBA1C 6.5 (H) 07/03/2019   HGBA1C 7.3 (A) 04/18/2019      Meeting Hgb A1C target    .  LDL CALC < 100         Lab Results  Component Value Date   CHOL 122 11/14/2018   HDL 34 (L) 11/14/2018   LDLCALC 64 11/14/2018   TRIG 135 11/14/2018   CHOLHDL 3.6 11/14/2018     Meeting lipid targets       The patient verbalized understanding of instructions provided today and declined a print copy of patient instruction materials.   The care management team will reach out to the patient again over the next 30-60 days.   Kelli Churn RN, CCM, [facility] RN Care Manager [phone]

## 2019-10-24 NOTE — Chronic Care Management (AMB) (Addendum)
Chronic Care Management   Follow Up Note   10/24/2019 Name: [name] MRN: [id number] DOB: October 20, 1975  Referred by: [name], MD Reason for referral : Chronic Care Management ( IDDM, HF, HLD, HTN, CKD)   [name] is a 44 y.o. year old male who is a primary care patient of [name], MD. The CCM team was consulted for assistance with chronic disease management and care coordination needs.    Review of patient status, including review of consultants reports, relevant laboratory and other test results, and collaboration with appropriate care team members and the patient's provider was performed as part of comprehensive patient evaluation and provision of chronic care management services.    SDOH (Social Determinants of Health) assessments performed: No See Care Plan activities for detailed interventions related to [facility])     Outpatient Encounter Medications as of 10/24/2019  Medication Sig Note  . Accu-Chek Softclix Lancets lancets Check blood sugar up to 3 times a day (Patient taking differently: 1 each by Other route See admin instructions. Check blood sugar up to 4-5 times a day)   . acidophilus (RISAQUAD) CAPS capsule Take 1 capsule by mouth daily.   Marland Kitchen allopurinol (ZYLOPRIM) 100 MG tablet Take 2 tablets (200 mg total) by mouth daily.   . Alpha-Lipoic Acid 600 MG CAPS Take 600 mg by mouth daily.   Marland Kitchen amLODipine (NORVASC) 10 MG tablet Take 10 mg by mouth at bedtime.   . Blood Glucose Monitoring Suppl (ACCU-CHEK GUIDE ME) w/Device KIT Use to check blood sugar up to 3 times a day (Patient taking differently: 1 each by Other route See admin instructions. Use to check blood sugar up to 4-5 times a day)   . calcitRIOL (ROCALTROL) 0.5 MCG capsule Take 2.5 mcg by mouth daily.    . carvedilol (COREG) 25 MG tablet Take 1 tablet (25 mg total) by mouth 2 (two) times daily with a meal.   . cloNIDine (CATAPRES) 0.3 MG tablet Take 1 tablet (0.3 mg total) by mouth 2 (two) times  daily.   . Cyanocobalamin (B-12) 500 MCG SUBL Place 500 mcg under the tongue daily.    Marland Kitchen ELIQUIS 5 MG TABS tablet Take 1 tablet by mouth twice daily (Patient taking differently: Take 5 mg by mouth 2 (two) times daily. )   . famotidine (PEPCID) 20 MG tablet Take 1 tablet (20 mg total) by mouth 2 (two) times daily. (Patient taking differently: Take 20 mg by mouth 2 (two) times daily as needed for heartburn or indigestion. ) 09/16/2019: States he takes prn  . furosemide (LASIX) 80 MG tablet Take 1 tablet (80 mg total) by mouth 2 (two) times daily. (Patient taking differently: Take 80-160 mg by mouth 2 (two) times daily. 120m in the morning and 863min the afternoon.)   . gabapentin (NEURONTIN) 100 MG capsule Take 1 capsule (100 mg total) by mouth 3 (three) times daily.   . Marland Kitchenlucose blood (ACCU-CHEK GUIDE) test strip Check blood sugar up to 3 times a day (Patient taking differently: 1 each by Other route See admin instructions. Check blood sugar up to 4-5 times a day)   . hydrALAZINE (APRESOLINE) 50 MG tablet Take 1 tablet by mouth 4 times daily (Patient taking differently: Take 50 mg by mouth 4 (four) times daily. )   . insulin aspart (NOVOLOG) 100 UNIT/ML FlexPen Inject 25 Units into the skin 3 (three) times daily with meals. 20 unit morning, 25 units lunch 30 units for dinner (Patient taking differently: Inject  20-30 Units into the skin See admin instructions. 20 units in the morning, 25 units in the afternoon and 30 units before bedtime Note: Doesn't use on mornings when he works out.)   . Insulin Pen Needle (B-D UF III MINI PEN NEEDLES) 31G X 5 MM MISC USE  4 TIMES DAILY (Patient taking differently: 1 each by Other route See admin instructions. USE 4-5 TIMES DAILY)   . Insulin Pen Needle (PEN NEEDLES 3/16") 31G X 5 MM MISC Use five times daily (Patient taking differently: 1 each by Other route See admin instructions. Use five times daily)   . Lancet Devices (ACCU-CHEK SOFTCLIX) lancets Use to check blood  sugars 5 times a day. Dx code:E11.9. Insulin dependent. (Patient taking differently: 1 each by Other route See admin instructions. Use to check blood sugars 5 times a day. Dx code:E11.9. Insulin dependent.)   . lovastatin (MEVACOR) 20 MG tablet Take 1 tablet by mouth once daily (Patient taking differently: Take 20 mg by mouth at bedtime. )   . Melatonin 10 MG TABS Take 10 mg by mouth at bedtime as needed (sleep).   . metolazone (ZAROXOLYN) 2.5 MG tablet Take 2.5 mg by mouth every other day.   . Omega-3 Fatty Acids (FISH OIL) 1000 MG CAPS Take 1,000 mg by mouth at bedtime.    . ondansetron (ZOFRAN) 4 MG tablet Take 1 tablet (4 mg total) by mouth every 6 (six) hours as needed for nausea.   Marland Kitchen oxybutynin (DITROPAN) 5 MG tablet Take 5 mg by mouth 3 (three) times daily.    . polyethylene glycol (MIRALAX / GLYCOLAX) 17 g packet Take 17 g by mouth 2 (two) times daily. (Patient taking differently: Take 17 g by mouth daily as needed. )   . senna-docusate (SENOKOT-S) 8.6-50 MG tablet Take 1 tablet by mouth daily.   . tamsulosin (FLOMAX) 0.4 MG CAPS capsule Take 0.4 mg by mouth at bedtime.    . TRESIBA FLEXTOUCH 200 UNIT/ML FlexTouch Pen INJECT 70 UNITS SUBCUTANEOUSLY ONCE DAILY (Patient taking differently: 96 Units. )   . VICTOZA 18 MG/3ML SOPN INJECT 1.2 MG SUBCUTANEOUSLY ONCE DAILY AT  THE  SAME  TIME    No facility-administered encounter medications on file as of 10/24/2019.     Objective:  Wt Readings from Last 3 Encounters:  10/17/19 (!) 352 lb (159.7 kg)  10/01/19 (!) 352 lb (159.7 kg)  09/14/19 (!) 332 lb 14.3 oz (151 kg)   Goals Addressed              This Visit's Progress     Patient Stated   .  " I don't have any scales and Dr. Radford Pax told me she would help me get a CPAP machine." (pt-stated)        CARE PLAN ENTRY (see longitudinal plan of care for additional care plan information)  Current Barriers:  . Care Coordination needs related to DME  in a patient with IDDM, HF, HLD, HTN,  CKD- spoke with patient states he has sleep study scheduled for 10/20 in preparation for getting CPAP  Nurse Case Manager Clinical Goal(s):  Marland Kitchen Over the next 30-90  days, patient will work with CCM team to address needs related to home scales  and CPAP  Interventions:  . Inter-disciplinary care team collaboration (see longitudinal plan of care)  . 09/23/20 Provided patient with scales and reminded him to weigh daily to assess for fluid retention . Verified that patient has sleep study scheduled  Patient Self Care  Activities:  . Patient verbalizes understanding of plan to work with CCM team to secure DME of CPAP and scales . Self administers medications as prescribed . Attends all scheduled provider appointments . Calls pharmacy for medication refills . ADL's performed with PCS assist . IADL's performed with PCS assist . Calls provider office for new concerns or questions . Unable to independently secure scales and CPAP  Please see past updates related to this goal by clicking on the "Past Updates" button in the selected goal      .  " I want to get my BMI under 35 so I am eligible for renal transplant  should I need it." (pt-stated)        CARE PLAN ENTRY (see longitudinal plan of care for additional care plan information)  Current Barriers:  Marland Kitchen Knowledge Deficits related to weight loss strategies as evidenced by weight gain reported/observed and/or altered eating habits . Chronic Disease Management support and education needs related to weight management in patient with CHF, HTN, HLD, DMII, and CKD Stage 3, s/p right AKA . Care Coordination needs related to assisting patient  with weight loss in a patient with IDDM, HF, HLD, HTN, CKD, s/p R AKA  Nurse Case Manager Clinical Goal(s):  Marland Kitchen Over the next 30-60 days, patient will verbalize basic understanding of plan for initiation of weight loss strategies . Over the next 30-60  days, patient will demonstrate improved health management  independence as evidenced by exercising as advised by provider (describe) 3-6 times weekly and following heart healthy diet per his clinic dietician . Over the next 30-60 days, patient will work with health care team and fitness team to address needs related to ongoing weight loss  Interventions:  . Evaluation of current treatment plan related to weight loss and patient's adherence to plan as established by provider . Provided verbal and/or written education to patient re: recommended life style changes: avoid fad diets, make small/incremental dietary and exercise changes, eat at the table and avoid eating in front of the TV, plan management of cravings, monitor snacking and cravings in food diary . Discussed plans with patient for ongoing care management follow up and provided patient with direct contact information for care management team . Reviewed scheduled/upcoming provider appointments including: sleep study on 10/29/19 and [employer] for prosthetic sleeve fitting  Patient Self Care Activities:  . Patient verbalizes understanding of plan to work with health care team and fitness team to achieve desired weight loss to meet goal of BMI<35 . Self administers medications as prescribed . Attends all scheduled provider appointments . Calls pharmacy for medication refills . Calls provider office for new concerns or questions . Unable to independently meet weight loss goal of BMI <35  Initial goal documentation     .  "I want to improve my kidney health." (pt-stated)        CARE PLAN ENTRY (see longitudinal plan of care for additional care plan information)  Current Barriers:  . Chronic Disease Management support, education, and care coordination needs related to CHF, HTN, HLD, DMII, and CKD Stage 3 - spoke with patient to complete follow up assessment, he says he was referred by his nephrologist to Dr Donzetta Matters to discuss vascular access procedure, patient states his ultimate goal is to get his BMI  to <35 so he might qualify for a renal transplant, he voiced great appreciation for the electronic scales that were given to him by this CCM RN on 09/24/19, states "they are the best  thing since sliced bread", he remains  motivated to improve his kidney health, he says he follows a low CHO, healthy protein diet, exercises 6 days a week at a gym (despite right AKA) for 60 minute sessions that include cardio and strengthening and working with a Physiological scientist, he says he has an appointment with [employer] this month to have his prosthetic sleeve refitted due to his weight loss, says he uses a walker and/or Rolator to ambulate, denies falls  Clinical Goal(s) related to CHF, HTN, HLD, DMII, and CKD Stage 3 :  Over the next 30-60 days, patient will:  . Work with the care management team to address educational, disease management, and care coordination needs  . Begin or continue self health monitoring activities as directed today Measure and record CBG (blood glucose) 1-3 times daily, Measure and record blood pressure 4-5 times per week, and voice knowledge of strategies to improve kidney function . Call provider office for new or worsened signs and symptoms Blood glucose findings outside established parameters, Blood pressure findings outside established parameters, Chest pain, Shortness of breath, and New or worsened symptom related to IDDM, HTN , CKD  . Call care management team with questions or concerns . Verbalize basic understanding of patient centered plan of care established today  Interventions related to CHF, HTN, HLD, DMII, and CKD Stage 3 :  . Prior to interviewing patient, reviewed patient status, including review of consultants reports, relevant laboratory and other test results, and medications, completed. . Evaluation of current treatment plans and patient's adherence to plan as established by provider . Assessed patient understanding of disease states . Assessed patient's education and care  coordination needs . Congratulate patient on his significant weight loss and improvement in his Hgb A1C to 5.9% on 08/30/19 . Collaborated with appropriate clinical care team members regarding patient needs  Patient Self Care Activities related to CHF, HTN, DMII, and CKD Stage 3 :  . Patient is unable to independently self-manage chronic health conditions  Please see past updates related to this goal by clicking on the "Past Updates" button in the selected goal           Other   .  Blood Pressure < 140/90        BP Readings from Last 3 Encounters:  10/17/19 (!) 144/83  10/06/19 133/70  10/01/19 126/74    Not meeting blood pressure targets    .  HEMOGLOBIN A1C < 7         Lab Results  Component Value Date   HGBA1C 5.9 (H) 08/30/2019   HGBA1C 6.5 (H) 07/03/2019   HGBA1C 7.3 (A) 04/18/2019      Meeting Hgb A1C target    .  LDL CALC < 100         Lab Results  Component Value Date   CHOL 122 11/14/2018   HDL 34 (L) 11/14/2018   LDLCALC 64 11/14/2018   TRIG 135 11/14/2018   CHOLHDL 3.6 11/14/2018     Meeting lipid targets- consider updating lipid panel- pt has DM and on statin Rx         Plan:   The care management team will reach out to the patient again over the next 30-60 days.    Kelli Churn RN, CCM, [facility] RN Care Manager [phone]

## 2019-10-24 NOTE — Progress Notes (Signed)
Internal Medicine Clinic Resident  I have personally reviewed this encounter including the documentation in this note and/or discussed this patient with the care management provider. I will address any urgent items identified by the care management provider and will communicate my actions to the patient's PCP. I have reviewed the patient's CCM visit with my supervising attending, Dr Rebeca Alert.  Delice Bison, DO 10/24/2019

## 2019-10-26 ENCOUNTER — Other Ambulatory Visit: Payer: Self-pay | Admitting: Internal Medicine

## 2019-10-29 ENCOUNTER — Other Ambulatory Visit: Payer: Self-pay

## 2019-10-29 ENCOUNTER — Other Ambulatory Visit: Payer: Self-pay | Admitting: Internal Medicine

## 2019-10-29 ENCOUNTER — Other Ambulatory Visit: Payer: Self-pay | Admitting: Student

## 2019-10-29 ENCOUNTER — Ambulatory Visit (HOSPITAL_BASED_OUTPATIENT_CLINIC_OR_DEPARTMENT_OTHER): Payer: Medicare Other | Attending: Cardiology | Admitting: Cardiology

## 2019-10-29 DIAGNOSIS — G4733 Obstructive sleep apnea (adult) (pediatric): Secondary | ICD-10-CM | POA: Insufficient documentation

## 2019-10-29 DIAGNOSIS — R0902 Hypoxemia: Secondary | ICD-10-CM | POA: Insufficient documentation

## 2019-10-29 MED ORDER — TRESIBA FLEXTOUCH 200 UNIT/ML ~~LOC~~ SOPN
PEN_INJECTOR | SUBCUTANEOUS | 1 refills | Status: DC
Start: 2019-10-29 — End: 2019-12-01

## 2019-10-29 NOTE — Progress Notes (Signed)
Received [name] refill request from patient.  Called patient to verify amount of insulin currently using daily.  Patient states he has been seeing Dr. Dwyane Dee for his diabetes and he is currently on 96 units of Tresiba daily instead of the 70 units.  States his A1c is down to 5.9 and currently well controlled. His blood sugars have been fine at home. Patient's [name] prescription was modified to reflect this change.

## 2019-10-30 NOTE — Procedures (Signed)
Patient Name: [name], [name] Date: 10/29/2019 Gender: Male D.O.B: 1975-06-15 Age (years): 29 Referring Provider: Fransico Him MD, ABSM Height (inches): 74 Interpreting Physician: Fransico Him MD, ABSM Weight (lbs): 335 RPSGT: Laren Everts BMI: 59 MRN: [id number] Neck Size: 18.50  CLINICAL INFORMATION Sleep Study Type: Split Night CPAP  Indication for sleep study: Congestive Heart Failure, Diabetes, Hypertension, Morning Headaches, Obesity, OSA  Epworth Sleepiness Score: 11  SLEEP STUDY TECHNIQUE As per the AASM Manual for the Scoring of Sleep and Associated Events v2.3 (April 2016) with a hypopnea requiring 4% desaturations.  The channels recorded and monitored were frontal, central and occipital EEG, electrooculogram (EOG), submentalis EMG (chin), nasal and oral airflow, thoracic and abdominal wall motion, anterior tibialis EMG, snore microphone, electrocardiogram, and pulse oximetry. Continuous positive airway pressure (CPAP) was initiated when the patient met split night criteria and was titrated according to treat sleep-disordered breathing.  MEDICATIONS Medications self-administered by patient taken the night of the study : APRESOLINE, NOVOLOG, LOVASTATIN, OXYBUTYNIN  RESPIRATORY PARAMETERS Diagnostic Total AHI (/hr): 25.6  RDI (/hr):29.5  OA Index (/hr): 1.5  CA Index (/hr): 0.0 REM AHI (/hr): 58.5  NREM AHI (/hr):9.4  Supine AHI (/hr):25.6  Non-supine AHI (/hr):0 Min O2 Sat (%):80.0 Mean O2 (%): 95.6  Time below 88% (min):5.4   Titration Optimal Pressure (cm):16  AHI at Optimal Pressure (/hr):1.2  Min O2 at Optimal Pressure (%):91.0 Supine % at Optimal (%):100  Sleep % at Optimal (%):95   SLEEP ARCHITECTURE The recording time for the entire night was 375.3 minutes.  During a baseline period of 141.3 minutes, the patient slept for 124.0 minutes in REM and nonREM, yielding a sleep efficiency of 87.8%. Sleep onset after lights out was 16.4 minutes  with a REM latency of 64.0 minutes. The patient spent 4.4% of the night in stage N1 sleep, 62.5% in stage N2 sleep, 0.0% in stage N3 and 33.1% in REM.  During the titration period of 225.2 minutes, the patient slept for 222.4 minutes in REM and nonREM, yielding a sleep efficiency of 98.7%. Sleep onset after CPAP initiation was 0.0 minutes with a REM latency of 55.9 minutes. The patient spent 0.9% of the night in stage N1 sleep, 75.1% in stage N2 sleep, 0.0% in stage N3 and 24.1% in REM.  CARDIAC DATA The 2 lead EKG demonstrated sinus rhythm. The mean heart rate was 100.0 beats per minute.  LEG MOVEMENT DATA The total Periodic Limb Movements of Sleep (PLMS) were 0. The PLMS index was 0.0 .  IMPRESSIONS - Moderate obstructive sleep apnea occurred during the diagnostic portion of the study(AHI = 25.6/hour). An optimal PAP pressure was selected for this patient ( 16 cm of water) - No significant central sleep apnea occurred during the diagnostic portion of the study (CAI = 0.0/hour). - Moderate oxygen desaturation was noted during the diagnostic portion of the study (Min O2 =80.0%). - The patient snored with moderate snoring volume during the diagnostic portion of the study. - Clinically significant periodic limb movements did not occur during sleep.  DIAGNOSIS - Obstructive Sleep Apnea (G47.33) - Nocturnal Hypoxemia  RECOMMENDATIONS - Trial of CPAP therapy on 16 cm H2O with a Medium size Fisher&Paykel Full Face Mask F&P Vitera (new) mask and heated humidification. - Avoid alcohol, sedatives and other CNS depressants that may worsen sleep apnea and disrupt normal sleep architecture. - Sleep hygiene should be reviewed to assess factors that may improve sleep quality. - Weight management and regular exercise should be initiated or continued. -  [facility] to [facility] for [facility]-evaluation after 8 weeks of therapy  [Electronically signed] 10/30/2019 11:56 AM  Fransico Him MD, ABSM Diplomate,  American Board of Sleep Medicine

## 2019-10-31 ENCOUNTER — Telehealth: Payer: Self-pay

## 2019-10-31 ENCOUNTER — Telehealth: Payer: Self-pay | Admitting: *Deleted

## 2019-10-31 NOTE — Telephone Encounter (Signed)
-----   Message from [name], [place] sent at 10/31/2019  1:38 PM EDT ----- Regarding: MEDICATION Patient states his carvedilol 25 mg bid is causing erectile dysfunction for patient and he wants another alternative if possible..  Thanks, Gae Bon

## 2019-10-31 NOTE — Telephone Encounter (Signed)
Informed patient of sleep study results and patient understanding was verbalized. Patient understands her sleep study showed:they have significant sleep apnea and had successful PAP titration and will be set up with PAP unit. Please let DME know that order is in EPIC. Please set patient up for OV in 8 weeks   Upon patient request DME selection is [facility]. Patient understands she/he will be contacted by [facility] to set up her/he cpap. Patient understands to call if [facility] does not contact her/he with new setup in a timely manner. Patient understands they will be called once confirmation has been received from [facility] that they have received their new machine to schedule 10 week follow up appointment.   [facility] notified of new cpap order  Please add to airview Patient was grateful for the call and thanked me.

## 2019-10-31 NOTE — Telephone Encounter (Signed)
Spoke with the patient who states that he is still having issues with carvedilol causing ED.  Per OV note 04/2019 patient was supposed to have testosterone levels checked and then would consider switching to Bystolic if hormones were normal. Patient has not had testosterone levels checked.  Patient was also started on sildenafil by urologist however he states that it did not help.  Patient states that he is going to call his endocrinologist to see if they can check his testosterone and then he will get back in touch with [place] about need to change carvedilol.

## 2019-10-31 NOTE — Telephone Encounter (Signed)
-----   Message from Sueanne Margarita, MD sent at 10/30/2019 12:00 PM EDT ----- Please let patient know that they have significant sleep apnea and had successful PAP titration and will be set up with PAP unit.  Please let DME know that order is in EPIC.  Please set patient up for OV in 8 weeks

## 2019-11-03 ENCOUNTER — Inpatient Hospital Stay (HOSPITAL_COMMUNITY): Admission: RE | Admit: 2019-11-03 | Payer: Medicare Other | Source: Ambulatory Visit

## 2019-11-05 ENCOUNTER — Other Ambulatory Visit (HOSPITAL_COMMUNITY): Payer: Self-pay | Admitting: *Deleted

## 2019-11-06 ENCOUNTER — Other Ambulatory Visit: Payer: Self-pay | Admitting: Internal Medicine

## 2019-11-06 ENCOUNTER — Other Ambulatory Visit: Payer: Self-pay

## 2019-11-06 ENCOUNTER — Ambulatory Visit (HOSPITAL_COMMUNITY)
Admission: RE | Admit: 2019-11-06 | Discharge: 2019-11-06 | Disposition: A | Discharge status: Home / Self Care | Payer: Medicare Other | Source: Ambulatory Visit | Attending: Nephrology | Admitting: Nephrology

## 2019-11-06 ENCOUNTER — Other Ambulatory Visit: Payer: Self-pay | Admitting: Student

## 2019-11-06 VITALS — BP 127/74 | HR 87 | Temp 98.2°F | Resp 18

## 2019-11-06 DIAGNOSIS — D631 Anemia in chronic kidney disease: Secondary | ICD-10-CM | POA: Diagnosis not present

## 2019-11-06 DIAGNOSIS — I5032 Chronic diastolic (congestive) heart failure: Secondary | ICD-10-CM

## 2019-11-06 DIAGNOSIS — N183 Chronic kidney disease, stage 3 unspecified: Secondary | ICD-10-CM | POA: Diagnosis not present

## 2019-11-06 LAB — IRON AND TIBC
Iron: 38 ug/dL — ABNORMAL LOW (ref 45–182)
Saturation Ratios: 14 % — ABNORMAL LOW (ref 17.9–39.5)
TIBC: 281 ug/dL (ref 250–450)
UIBC: 243 ug/dL

## 2019-11-06 LAB — RENAL FUNCTION PANEL
Albumin: 3.1 g/dL — ABNORMAL LOW (ref 3.5–5.0)
Anion gap: 10 (ref 5–15)
BUN: 51 mg/dL — ABNORMAL HIGH (ref 6–20)
CO2: 23 mmol/L (ref 22–32)
Calcium: 9.3 mg/dL (ref 8.9–10.3)
Chloride: 105 mmol/L (ref 98–111)
Creatinine, Ser: 3.19 mg/dL — ABNORMAL HIGH (ref 0.61–1.24)
GFR, Estimated: 24 mL/min — ABNORMAL LOW (ref >60)
Glucose, Bld: 141 mg/dL — ABNORMAL HIGH (ref 70–99)
Phosphorus: 4.3 mg/dL (ref 2.5–4.6)
Potassium: 4.3 mmol/L (ref 3.5–5.1)
Sodium: 138 mmol/L (ref 135–145)

## 2019-11-06 LAB — POCT HEMOGLOBIN-HEMACUE: Hemoglobin: 9.7 g/dL — ABNORMAL LOW (ref 13.0–17.0)

## 2019-11-06 LAB — FERRITIN: Ferritin: 321 ng/mL (ref 24–336)

## 2019-11-06 MED ORDER — DARBEPOETIN ALFA 100 MCG/0.5ML IJ SOSY
100.0000 ug | PREFILLED_SYRINGE | INTRAMUSCULAR | Status: DC
  Administered 2019-11-06: 100 ug via SUBCUTANEOUS

## 2019-11-06 MED ORDER — DARBEPOETIN ALFA 100 MCG/0.5ML IJ SOSY
PREFILLED_SYRINGE | INTRAMUSCULAR | Status: AC
  Filled 2019-11-06: qty 0.5

## 2019-11-07 ENCOUNTER — Other Ambulatory Visit: Payer: Self-pay

## 2019-11-07 DIAGNOSIS — I5032 Chronic diastolic (congestive) heart failure: Secondary | ICD-10-CM

## 2019-11-07 MED ORDER — CARVEDILOL 25 MG PO TABS: 25.0000 mg | ORAL_TABLET | Freq: Two times a day (BID) | ORAL | 1 refills | Status: DC

## 2019-11-17 NOTE — Progress Notes (Signed)
Internal Medicine Clinic Attending  CCM services provided by the care management provider and their documentation were reviewed with Dr. Bloomfield.  We reviewed the pertinent findings, urgent action items addressed by the resident and non-urgent items to be addressed by the PCP.  I agree with the assessment, diagnosis, and plan of care documented in the CCM and resident's note.  Gissele Narducci N Lindsie Simar, MD 11/17/2019  

## 2019-11-24 ENCOUNTER — Other Ambulatory Visit: Payer: Self-pay | Admitting: *Deleted

## 2019-11-24 ENCOUNTER — Other Ambulatory Visit: Payer: Self-pay | Admitting: Endocrinology

## 2019-11-24 MED ORDER — VICTOZA 18 MG/3ML ~~LOC~~ SOPN: PEN_INJECTOR | SUBCUTANEOUS | 0 refills | Status: DC

## 2019-11-25 ENCOUNTER — Ambulatory Visit: Payer: Medicare Other | Admitting: *Deleted

## 2019-11-25 DIAGNOSIS — E119 Type 2 diabetes mellitus without complications: Secondary | ICD-10-CM

## 2019-11-25 DIAGNOSIS — N183 Chronic kidney disease, stage 3 unspecified: Secondary | ICD-10-CM

## 2019-11-25 DIAGNOSIS — I10 Essential (primary) hypertension: Secondary | ICD-10-CM

## 2019-11-25 DIAGNOSIS — I5032 Chronic diastolic (congestive) heart failure: Secondary | ICD-10-CM

## 2019-11-25 DIAGNOSIS — Z89611 Acquired absence of right leg above knee: Secondary | ICD-10-CM

## 2019-11-25 DIAGNOSIS — E785 Hyperlipidemia, unspecified: Secondary | ICD-10-CM

## 2019-11-25 NOTE — Chronic Care Management (AMB) (Signed)
Chronic Care Management   Follow Up Note   11/25/2019 Name: [name] MRN: [id number] DOB: August 25, 1975  Referred by: [name], MD Reason for referral : Chronic Care Management (DDM, HF, HLD, HTN, CKD, s/p R AKA 02/20/14)   [name] is a 44 y.o. year old male who is a primary care patient of [name], MD. The CCM team was consulted for assistance with chronic disease management and care coordination needs.    Review of patient status, including review of consultants reports, relevant laboratory and other test results, and collaboration with appropriate care team members and the patient's provider was performed as part of comprehensive patient evaluation and provision of chronic care management services.    SDOH (Social Determinants of Health) assessments performed: No See Care Plan activities for detailed interventions related to [facility])     Outpatient Encounter Medications as of 11/25/2019  Medication Sig Note  . Accu-Chek Softclix Lancets lancets Check blood sugar up to 3 times a day (Patient taking differently: 1 each by Other route See admin instructions. Check blood sugar up to 4-5 times a day)   . acidophilus (RISAQUAD) CAPS capsule Take 1 capsule by mouth daily.   Marland Kitchen allopurinol (ZYLOPRIM) 100 MG tablet Take 2 tablets by mouth once daily   . Alpha-Lipoic Acid 600 MG CAPS Take 600 mg by mouth daily.   Marland Kitchen amLODipine (NORVASC) 10 MG tablet Take 10 mg by mouth at bedtime.   . Blood Glucose Monitoring Suppl (ACCU-CHEK GUIDE ME) w/Device KIT Use to check blood sugar up to 3 times a day (Patient taking differently: 1 each by Other route See admin instructions. Use to check blood sugar up to 4-5 times a day)   . calcitRIOL (ROCALTROL) 0.5 MCG capsule Take 2.5 mcg by mouth daily.    . carvedilol (COREG) 25 MG tablet Take 1 tablet (25 mg total) by mouth 2 (two) times daily with a meal.   . cloNIDine (CATAPRES) 0.3 MG tablet Take 1 tablet (0.3 mg total) by mouth 2 (two)  times daily.   . Cyanocobalamin (B-12) 500 MCG SUBL Place 500 mcg under the tongue daily.    Marland Kitchen ELIQUIS 5 MG TABS tablet Take 1 tablet by mouth twice daily   . famotidine (PEPCID) 20 MG tablet Take 1 tablet (20 mg total) by mouth 2 (two) times daily. (Patient taking differently: Take 20 mg by mouth 2 (two) times daily as needed for heartburn or indigestion. ) 09/16/2019: States he takes prn  . furosemide (LASIX) 80 MG tablet Take 1 tablet by mouth twice daily   . gabapentin (NEURONTIN) 100 MG capsule Take 1 capsule (100 mg total) by mouth 3 (three) times daily.   Marland Kitchen glucose blood (ACCU-CHEK GUIDE) test strip Check blood sugar up to 3 times a day (Patient taking differently: 1 each by Other route See admin instructions. Check blood sugar up to 4-5 times a day)   . hydrALAZINE (APRESOLINE) 50 MG tablet Take 1 tablet by mouth 4 times daily   . insulin aspart (NOVOLOG) 100 UNIT/ML FlexPen Inject 25 Units into the skin 3 (three) times daily with meals. 20 unit morning, 25 units lunch 30 units for dinner (Patient taking differently: Inject 20-30 Units into the skin See admin instructions. 20 units in the morning, 25 units in the afternoon and 30 units before bedtime Note: Doesn't use on mornings when he works out.)   . insulin degludec (TRESIBA FLEXTOUCH) 200 UNIT/ML FlexTouch Pen INJECT 96 UNITS SUBCUTANEOUSLY ONCE DAILY   .  Insulin Pen Needle (B-D UF III MINI PEN NEEDLES) 31G X 5 MM MISC USE  4 TIMES DAILY (Patient taking differently: 1 each by Other route See admin instructions. USE 4-5 TIMES DAILY)   . Insulin Pen Needle (PEN NEEDLES 3/16") 31G X 5 MM MISC Use five times daily (Patient taking differently: 1 each by Other route See admin instructions. Use five times daily)   . Lancet Devices (ACCU-CHEK SOFTCLIX) lancets Use to check blood sugars 5 times a day. Dx code:E11.9. Insulin dependent. (Patient taking differently: 1 each by Other route See admin instructions. Use to check blood sugars 5 times a day. Dx  code:E11.9. Insulin dependent.)   . lovastatin (MEVACOR) 20 MG tablet Take 1 tablet by mouth once daily (Patient taking differently: Take 20 mg by mouth at bedtime. )   . Melatonin 10 MG TABS Take 10 mg by mouth at bedtime as needed (sleep).   . metolazone (ZAROXOLYN) 2.5 MG tablet Take 2.5 mg by mouth every other day.   . Omega-3 Fatty Acids (FISH OIL) 1000 MG CAPS Take 1,000 mg by mouth at bedtime.    . ondansetron (ZOFRAN) 4 MG tablet Take 1 tablet (4 mg total) by mouth every 6 (six) hours as needed for nausea.   Marland Kitchen oxybutynin (DITROPAN) 5 MG tablet Take 5 mg by mouth 3 (three) times daily.    . polyethylene glycol (MIRALAX / GLYCOLAX) 17 g packet Take 17 g by mouth 2 (two) times daily. (Patient taking differently: Take 17 g by mouth daily as needed. )   . senna-docusate (SENOKOT-S) 8.6-50 MG tablet Take 1 tablet by mouth daily.   . tamsulosin (FLOMAX) 0.4 MG CAPS capsule Take 0.4 mg by mouth at bedtime.    Marland Kitchen VICTOZA 18 MG/3ML SOPN INJECT 1.2 MG (0.2 MLS) INTO THE SKIN ONCE DAILY AT THE SAME TIME. No further refills until patient is seen in the office    No facility-administered encounter medications on file as of 11/25/2019.     Objective:  Wt Readings from Last 3 Encounters:  10/29/19 (!) 335 lb (152 kg)  10/17/19 (!) 352 lb (159.7 kg)  10/01/19 (!) 352 lb (159.7 kg)   Lab Results  Component Value Date   CREATININE 3.19 (H) 11/06/2019   BUN 51 (H) 11/06/2019   NA 138 11/06/2019   K 4.3 11/06/2019   CL 105 11/06/2019   CO2 23 11/06/2019     Goals Addressed              This Visit's Progress     Patient Stated   .  " I don't have any scales and Dr. Radford Pax told me she would help me get a CPAP machine." (pt-stated)        CARE PLAN ENTRY (see longitudinal plan of care for additional care plan information)  Current Barriers:  . Care Coordination needs related to DME  in a patient with IDDM, HF, HLD, HTN, CKD- spoke with patient states he completed the sleep study on 10/20  but has not been contacted about obtaining CPAP machine  Nurse Case Manager Clinical Goal(s):  Marland Kitchen Over the next 30-90  days, patient will work with CCM team to address needs related to home scales  and CPAP  Interventions:  . Inter-disciplinary care team collaboration (see longitudinal plan of care)  . Reviewed findings of 10/29/19 sleep study and advised patient he does have a diagnosis of OSA and that Dr Radford Pax wrote an order for CPAP on 10/30/19 . Encouraged  patient to call Dr Theodosia Blender office for follow up on how to obtain CPAP machine  Patient Self Care Activities:  . Patient verbalizes understanding of plan to work with CCM team to secure DME of CPAP and scales . Self administers medications as prescribed . Attends all scheduled provider appointments . Calls pharmacy for medication refills . ADL's performed with PCS assist . IADL's performed with PCS assist . Calls provider office for new concerns or questions . Unable to independently secure scales and CPAP  Please see past updates related to this goal by clicking on the "Past Updates" button in the selected goal      .  " I want to get my BMI under 35 so I am eligible for renal transplant should I need it." (pt-stated)        CARE PLAN ENTRY (see longitudinal plan of care for additional care plan information)  Current Barriers:  Marland Kitchen Knowledge Deficits related to weight loss strategies as evidenced by weight gain reported/observed and/or altered eating habits . Chronic Disease Management support and education needs related to weight management in patient with CHF, HTN, HLD, DMII, and CKD Stage 3, s/p right AKA . Care Coordination needs related to assisting patient  with weight loss in a patient with IDDM, HF, HLD, HTN, CKD, s/p R AKA- spoke with patient says he has an appointment with [employer] this week for prosthetic sleeve fitting this week - this is needed due to his weight loss  Nurse Case Manager Clinical Goal(s):  Marland Kitchen Over the  next 30-60 days, patient will verbalize basic understanding of plan for initiation of weight loss strategies . Over the next 30-60  days, patient will demonstrate improved health management independence as evidenced by exercising as advised by provider (describe) 3-6 times weekly and following heart healthy diet per his clinic dietician . Over the next 30-60 days, patient will work with health care team and fitness team to address needs related to ongoing weight loss  Interventions:  . Evaluation of current treatment plan related to weight loss and patient's adherence to plan as established by provider . Provided verbal and/or written education to patient re: recommended life style changes: avoid fad diets, make small/incremental dietary and exercise changes, eat at the table and avoid eating in front of the TV, plan management of cravings, monitor snacking and cravings in food diary . Discussed plans with patient for ongoing care management follow up and provided patient with direct contact information for care management team . Reviewed scheduled/upcoming provider appointments including: [employer] for prosthetic sleeve fitting for next week per patient  Patient Self Care Activities:  . Patient verbalizes understanding of plan to work with health care team and fitness team to achieve desired weight loss to meet goal of BMI<35 . Self administers medications as prescribed . Attends all scheduled provider appointments . Calls pharmacy for medication refills . Calls provider office for new concerns or questions . Unable to independently meet weight loss goal of BMI <35  Please see past updates related to this goal by clicking on the "Past Updates" button in the selected goal        Other   .  HEMOGLOBIN A1C < 7         Lab Results  Component Value Date   HGBA1C 5.9 (H) 08/30/2019   HGBA1C 6.5 (H) 07/03/2019   HGBA1C 7.3 (A) 04/18/2019     Meeting Hgb A1C target 11/25/19 Patient states the  majority of his self monitored blood  sugars are meeting target        Plan:   The care management team will reach out to the patient again over the next 30-60 days.    Kelli Churn RN, CCM, [facility] RN Care Manager [phone]

## 2019-11-25 NOTE — Patient Instructions (Signed)
Visit Information It was nice speaking with you today. Goals Addressed              This Visit's Progress     Patient Stated   .  " I don't have any scales and Dr. Radford Pax told me she would help me get a CPAP machine." (pt-stated)        CARE PLAN ENTRY (see longitudinal plan of care for additional care plan information)  Current Barriers:  . Care Coordination needs related to DME  in a patient with IDDM, HF, HLD, HTN, CKD- spoke with patient states he completed the sleep study on 10/20 but has not been contacted about obtaining CPAP machine  Nurse Case Manager Clinical Goal(s):  Marland Kitchen Over the next 30-90  days, patient will work with CCM team to address needs related to home scales  and CPAP  Interventions:  . Inter-disciplinary care team collaboration (see longitudinal plan of care)  . Reviewed findings of 10/29/19 sleep study and advised patient he does have a diagnosis of OSA and that Dr Radford Pax wrote an order for CPAP on 10/30/19 . Encouraged patient to call Dr Theodosia Blender office for follow up on how to obtain CPAP machine  Patient Self Care Activities:  . Patient verbalizes understanding of plan to work with CCM team to secure DME of CPAP and scales . Self administers medications as prescribed . Attends all scheduled provider appointments . Calls pharmacy for medication refills . ADL's performed with PCS assist . IADL's performed with PCS assist . Calls provider office for new concerns or questions . Unable to independently secure scales and CPAP  Please see past updates related to this goal by clicking on the "Past Updates" button in the selected goal      .  " I want to get my BMI under 35 so I am eligible for renal transplant should I need it." (pt-stated)        CARE PLAN ENTRY (see longitudinal plan of care for additional care plan information)  Current Barriers:  Marland Kitchen Knowledge Deficits related to weight loss strategies as evidenced by weight gain reported/observed and/or  altered eating habits . Chronic Disease Management support and education needs related to weight management in patient with CHF, HTN, HLD, DMII, and CKD Stage 3, s/p right AKA . Care Coordination needs related to assisting patient  with weight loss in a patient with IDDM, HF, HLD, HTN, CKD, s/p R AKA- spoke with patient says he has an appointment with [employer] this week for prosthetic sleeve fitting this week - this is needed due to his weight loss  Nurse Case Manager Clinical Goal(s):  Marland Kitchen Over the next 30-60 days, patient will verbalize basic understanding of plan for initiation of weight loss strategies . Over the next 30-60  days, patient will demonstrate improved health management independence as evidenced by exercising as advised by provider (describe) 3-6 times weekly and following heart healthy diet per his clinic dietician . Over the next 30-60 days, patient will work with health care team and fitness team to address needs related to ongoing weight loss  Interventions:  . Evaluation of current treatment plan related to weight loss and patient's adherence to plan as established by provider . Provided verbal and/or written education to patient re: recommended life style changes: avoid fad diets, make small/incremental dietary and exercise changes, eat at the table and avoid eating in front of the TV, plan management of cravings, monitor snacking and cravings in food diary . Discussed  plans with patient for ongoing care management follow up and provided patient with direct contact information for care management team . Reviewed scheduled/upcoming provider appointments including: [employer] for prosthetic sleeve fitting next week per patient  Patient Self Care Activities:  . Patient verbalizes understanding of plan to work with health care team and fitness team to achieve desired weight loss to meet goal of BMI<35 . Self administers medications as prescribed . Attends all scheduled provider  appointments . Calls pharmacy for medication refills . Calls provider office for new concerns or questions . Unable to independently meet weight loss goal of BMI <35  Please see past updates related to this goal by clicking on the "Past Updates" button in the selected goal        Other   .  HEMOGLOBIN A1C < 7         Lab Results  Component Value Date   HGBA1C 5.9 (H) 08/30/2019   HGBA1C 6.5 (H) 07/03/2019   HGBA1C 7.3 (A) 04/18/2019     Meeting Hgb A1C target 11/25/19 Patient states the majority of his self monitored blood sugars are meeting target       The patient verbalized understanding of instructions, educational materials, and care plan provided today and agreed to receive a mailed copy of patient instructions, educational materials, and care plan.   The care management team will reach out to the patient again over the next 30-60 days.   Kelli Churn RN, CCM, [facility] RN Care Manager [phone]

## 2019-11-26 ENCOUNTER — Telehealth: Payer: Self-pay

## 2019-11-26 NOTE — Telephone Encounter (Signed)
Returned call to patient. States his left leg has "massive swelling" that started a few weeks ago. It is now affecting his breathing during his workouts and when sleeping. States his leg looks like a "drumstick." Patient with hx of CHF, CKD, right AKA. Advised patient to head directly to ED.  Patient also feels his blood sugars are running low. Checked it during our conversation and it is 36. He is now drinking Power Ade and eating chicken wrap. He will recheck BS in 15 minutes. States he ate twice this AM before 10 AM and took his Novolog insulin but this is the first time today he checked his BS. He also has sugar candy that he will take if BS remains low. He will have girlfriend drive him to ED. Hubbard Hartshorn, BSN, RN-BC

## 2019-11-26 NOTE — Progress Notes (Signed)
Internal Medicine Clinic Attending  CCM services provided by the care management provider and their documentation were discussed with Dr. Basaraba. We reviewed the pertinent findings, urgent action items addressed by the resident and non-urgent items to be addressed by the PCP.  I agree with the assessment, diagnosis, and plan of care documented in the CCM and resident's note.  Kalie Cabral Thomas Damaria Stofko, MD 11/26/2019  

## 2019-11-26 NOTE — Telephone Encounter (Signed)
Requesting to speak with a nurse about left leg swollen, please call pt back.

## 2019-11-26 NOTE — Progress Notes (Signed)
Internal Medicine Clinic Resident  I have personally reviewed this encounter including the documentation in this note and/or discussed this patient with the care management provider. I will address any urgent items identified by the care management provider and will communicate my actions to the patient's PCP. I have reviewed the patient's CCM visit with my supervising attending, Dr Vincent.  Victor Broady, MD 11/26/2019  

## 2019-11-27 ENCOUNTER — Telehealth: Payer: Self-pay | Admitting: *Deleted

## 2019-11-27 ENCOUNTER — Encounter: Payer: Medicare Other | Admitting: Internal Medicine

## 2019-11-27 NOTE — Telephone Encounter (Signed)
Called Mr.Marcano. He says his condition has improved. He is peeing like a race horse. drank parsley tea, doing things to decrease the swelling, went to the gym this morning, better now, but still wants an appointment tomorrow. Call was transferred to the front office.

## 2019-11-27 NOTE — Telephone Encounter (Signed)
CALLED PATIENT/ NOT ANSWER. CALLED PATIENT TO SEE IF PATIENT WANTED TO R/S APPOINTMENT (NOW SHOW TODAY'S APPT.) OR HAVE TELEVIEW VISIT.

## 2019-11-27 NOTE — Telephone Encounter (Signed)
Please refer to phone note from yesterday. Hubbard Hartshorn, BSN, RN-BC

## 2019-11-27 NOTE — Telephone Encounter (Signed)
Spoke with patient. He did not go to ED yesterday as advised. States he self-managed the leg swelling by drinking parsley tea. Went to gym this AM and his trainer advised him to do leg exercises for the swelling. States his breathing is much better now too. Wants appt for tomorrow. Advised to come today as this is still concerning. Appt given this afternoon with Red Team. Hubbard Hartshorn, BSN, RN-BC

## 2019-11-28 ENCOUNTER — Other Ambulatory Visit: Payer: Self-pay

## 2019-11-28 ENCOUNTER — Observation Stay (HOSPITAL_BASED_OUTPATIENT_CLINIC_OR_DEPARTMENT_OTHER): Payer: Medicare Other

## 2019-11-28 ENCOUNTER — Encounter: Payer: Self-pay | Admitting: Internal Medicine

## 2019-11-28 ENCOUNTER — Inpatient Hospital Stay (HOSPITAL_COMMUNITY)
Admission: AD | Admit: 2019-11-28 | Discharge: 2019-12-01 | DRG: 291 | Disposition: A | Discharge status: Home / Self Care | Payer: Medicare Other | Source: Ambulatory Visit | Attending: Internal Medicine | Admitting: Internal Medicine

## 2019-11-28 ENCOUNTER — Ambulatory Visit (INDEPENDENT_AMBULATORY_CARE_PROVIDER_SITE_OTHER): Payer: Medicare Other | Admitting: Internal Medicine

## 2019-11-28 ENCOUNTER — Observation Stay (HOSPITAL_COMMUNITY): Payer: Medicare Other

## 2019-11-28 VITALS — BP 195/79 | HR 103 | Temp 98.2°F | Ht 74.0 in

## 2019-11-28 DIAGNOSIS — Z86711 Personal history of pulmonary embolism: Secondary | ICD-10-CM

## 2019-11-28 DIAGNOSIS — M7989 Other specified soft tissue disorders: Secondary | ICD-10-CM | POA: Diagnosis not present

## 2019-11-28 DIAGNOSIS — G4733 Obstructive sleep apnea (adult) (pediatric): Secondary | ICD-10-CM | POA: Diagnosis not present

## 2019-11-28 DIAGNOSIS — K219 Gastro-esophageal reflux disease without esophagitis: Secondary | ICD-10-CM | POA: Diagnosis present

## 2019-11-28 DIAGNOSIS — N400 Enlarged prostate without lower urinary tract symptoms: Secondary | ICD-10-CM | POA: Diagnosis not present

## 2019-11-28 DIAGNOSIS — Z20822 Contact with and (suspected) exposure to covid-19: Secondary | ICD-10-CM | POA: Diagnosis not present

## 2019-11-28 DIAGNOSIS — E1122 Type 2 diabetes mellitus with diabetic chronic kidney disease: Secondary | ICD-10-CM | POA: Diagnosis not present

## 2019-11-28 DIAGNOSIS — Z7901 Long term (current) use of anticoagulants: Secondary | ICD-10-CM

## 2019-11-28 DIAGNOSIS — E1151 Type 2 diabetes mellitus with diabetic peripheral angiopathy without gangrene: Secondary | ICD-10-CM | POA: Diagnosis present

## 2019-11-28 DIAGNOSIS — Z6841 Body Mass Index (BMI) 40.0 and over, adult: Secondary | ICD-10-CM

## 2019-11-28 DIAGNOSIS — N184 Chronic kidney disease, stage 4 (severe): Secondary | ICD-10-CM | POA: Diagnosis present

## 2019-11-28 DIAGNOSIS — E119 Type 2 diabetes mellitus without complications: Secondary | ICD-10-CM

## 2019-11-28 DIAGNOSIS — N179 Acute kidney failure, unspecified: Secondary | ICD-10-CM | POA: Diagnosis present

## 2019-11-28 DIAGNOSIS — E785 Hyperlipidemia, unspecified: Secondary | ICD-10-CM | POA: Diagnosis present

## 2019-11-28 DIAGNOSIS — Z86718 Personal history of other venous thrombosis and embolism: Secondary | ICD-10-CM

## 2019-11-28 DIAGNOSIS — I130 Hypertensive heart and chronic kidney disease with heart failure and stage 1 through stage 4 chronic kidney disease, or unspecified chronic kidney disease: Principal | ICD-10-CM | POA: Diagnosis present

## 2019-11-28 DIAGNOSIS — Z888 Allergy status to other drugs, medicaments and biological substances status: Secondary | ICD-10-CM

## 2019-11-28 DIAGNOSIS — K5900 Constipation, unspecified: Secondary | ICD-10-CM | POA: Diagnosis present

## 2019-11-28 DIAGNOSIS — N390 Urinary tract infection, site not specified: Secondary | ICD-10-CM | POA: Diagnosis present

## 2019-11-28 DIAGNOSIS — F419 Anxiety disorder, unspecified: Secondary | ICD-10-CM | POA: Diagnosis present

## 2019-11-28 DIAGNOSIS — I5033 Acute on chronic diastolic (congestive) heart failure: Secondary | ICD-10-CM | POA: Diagnosis present

## 2019-11-28 DIAGNOSIS — Z89611 Acquired absence of right leg above knee: Secondary | ICD-10-CM

## 2019-11-28 DIAGNOSIS — E11649 Type 2 diabetes mellitus with hypoglycemia without coma: Secondary | ICD-10-CM | POA: Diagnosis present

## 2019-11-28 DIAGNOSIS — R059 Cough, unspecified: Secondary | ICD-10-CM

## 2019-11-28 DIAGNOSIS — M109 Gout, unspecified: Secondary | ICD-10-CM | POA: Diagnosis present

## 2019-11-28 DIAGNOSIS — Z794 Long term (current) use of insulin: Secondary | ICD-10-CM

## 2019-11-28 DIAGNOSIS — E1165 Type 2 diabetes mellitus with hyperglycemia: Secondary | ICD-10-CM

## 2019-11-28 DIAGNOSIS — F32A Depression, unspecified: Secondary | ICD-10-CM | POA: Diagnosis present

## 2019-11-28 DIAGNOSIS — E162 Hypoglycemia, unspecified: Secondary | ICD-10-CM

## 2019-11-28 DIAGNOSIS — Z79899 Other long term (current) drug therapy: Secondary | ICD-10-CM

## 2019-11-28 DIAGNOSIS — D631 Anemia in chronic kidney disease: Secondary | ICD-10-CM | POA: Diagnosis present

## 2019-11-28 LAB — GLUCOSE, CAPILLARY
Glucose-Capillary: 31 mg/dL — CL (ref 70–99)
Glucose-Capillary: 120 mg/dL — ABNORMAL HIGH (ref 70–99)
Glucose-Capillary: 20 mg/dL — CL (ref 70–99)
Glucose-Capillary: 114 mg/dL — ABNORMAL HIGH (ref 70–99)
Glucose-Capillary: 65 mg/dL — ABNORMAL LOW (ref 70–99)
Glucose-Capillary: 71 mg/dL (ref 70–99)
Glucose-Capillary: 103 mg/dL — ABNORMAL HIGH (ref 70–99)
Glucose-Capillary: 84 mg/dL (ref 70–99)
Glucose-Capillary: 108 mg/dL — ABNORMAL HIGH (ref 70–99)

## 2019-11-28 LAB — COMPREHENSIVE METABOLIC PANEL
ALT: 20 U/L (ref 0–44)
AST: 14 U/L — ABNORMAL LOW (ref 15–41)
Albumin: 2.9 g/dL — ABNORMAL LOW (ref 3.5–5.0)
Alkaline Phosphatase: 105 U/L (ref 38–126)
Anion gap: 11 (ref 5–15)
BUN: 87 mg/dL — ABNORMAL HIGH (ref 6–20)
CO2: 19 mmol/L — ABNORMAL LOW (ref 22–32)
Calcium: 9 mg/dL (ref 8.9–10.3)
Chloride: 107 mmol/L (ref 98–111)
Creatinine, Ser: 4.31 mg/dL — ABNORMAL HIGH (ref 0.61–1.24)
GFR, Estimated: 17 mL/min — ABNORMAL LOW (ref >60)
Glucose, Bld: 120 mg/dL — ABNORMAL HIGH (ref 70–99)
Potassium: 3.8 mmol/L (ref 3.5–5.1)
Sodium: 137 mmol/L (ref 135–145)
Total Bilirubin: 0.7 mg/dL (ref 0.3–1.2)
Total Protein: 7.1 g/dL (ref 6.5–8.1)

## 2019-11-28 LAB — CBC WITH DIFFERENTIAL/PLATELET
Abs Immature Granulocytes: 0.15 10*3/uL — ABNORMAL HIGH (ref 0.00–0.07)
Basophils Absolute: 0.1 10*3/uL (ref 0.0–0.1)
Basophils Relative: 0 %
Eosinophils Absolute: 0.1 10*3/uL (ref 0.0–0.5)
Eosinophils Relative: 1 %
HCT: 24.2 % — ABNORMAL LOW (ref 39.0–52.0)
Hemoglobin: 7.6 g/dL — ABNORMAL LOW (ref 13.0–17.0)
Immature Granulocytes: 1 %
Lymphocytes Relative: 4 %
Lymphs Abs: 0.8 10*3/uL (ref 0.7–4.0)
MCH: 28.5 pg (ref 26.0–34.0)
MCHC: 31.4 g/dL (ref 30.0–36.0)
MCV: 90.6 fL (ref 80.0–100.0)
Monocytes Absolute: 1.3 10*3/uL — ABNORMAL HIGH (ref 0.1–1.0)
Monocytes Relative: 7 %
Neutro Abs: 16.4 10*3/uL — ABNORMAL HIGH (ref 1.7–7.7)
Neutrophils Relative %: 87 %
Platelets: 318 10*3/uL (ref 150–400)
RBC: 2.67 MIL/uL — ABNORMAL LOW (ref 4.22–5.81)
RDW: 15.9 % — ABNORMAL HIGH (ref 11.5–15.5)
WBC: 18.8 10*3/uL — ABNORMAL HIGH (ref 4.0–10.5)
nRBC: 0 % (ref 0.0–0.2)

## 2019-11-28 LAB — LACTIC ACID, PLASMA
Lactic Acid, Venous: 0.8 mmol/L (ref 0.5–1.9)
Lactic Acid, Venous: 0.8 mmol/L (ref 0.5–1.9)

## 2019-11-28 LAB — URINALYSIS, ROUTINE W REFLEX MICROSCOPIC
Bilirubin Urine: NEGATIVE
Glucose, UA: NEGATIVE mg/dL
Hgb urine dipstick: NEGATIVE
Ketones, ur: NEGATIVE mg/dL
Nitrite: NEGATIVE
Protein, ur: 100 mg/dL — AB
Specific Gravity, Urine: 1.009 (ref 1.005–1.030)
pH: 5 (ref 5.0–8.0)

## 2019-11-28 LAB — HEMOGLOBIN A1C
Hgb A1c MFr Bld: 5.1 % (ref 4.8–5.6)
Mean Plasma Glucose: 99.67 mg/dL

## 2019-11-28 LAB — MAGNESIUM: Magnesium: 2.1 mg/dL (ref 1.7–2.4)

## 2019-11-28 LAB — RESPIRATORY PANEL BY RT PCR (FLU A&B, COVID)
Influenza A by PCR: NEGATIVE
Influenza B by PCR: NEGATIVE
SARS Coronavirus 2 by RT PCR: NEGATIVE

## 2019-11-28 LAB — D-DIMER, QUANTITATIVE: D-Dimer, Quant: 1.65 ug/mL-FEU — ABNORMAL HIGH (ref 0.00–0.50)

## 2019-11-28 LAB — BRAIN NATRIURETIC PEPTIDE: B Natriuretic Peptide: 294.2 pg/mL — ABNORMAL HIGH (ref 0.0–100.0)

## 2019-11-28 LAB — HIV ANTIBODY (ROUTINE TESTING W REFLEX): HIV Screen 4th Generation wRfx: NONREACTIVE

## 2019-11-28 LAB — PROCALCITONIN: Procalcitonin: 0.58 ng/mL

## 2019-11-28 MED ORDER — PRAVASTATIN SODIUM 10 MG PO TABS
20.0000 mg | ORAL_TABLET | Freq: Every day | ORAL | Status: DC
  Administered 2019-11-29 – 2019-12-01 (×2): 20 mg via ORAL
  Filled 2019-11-28 (×3): qty 2

## 2019-11-28 MED ORDER — OXYBUTYNIN CHLORIDE 5 MG PO TABS
5.0000 mg | ORAL_TABLET | Freq: Three times a day (TID) | ORAL | Status: DC
  Administered 2019-11-28 – 2019-12-01 (×9): 5 mg via ORAL
  Filled 2019-11-28 (×10): qty 1

## 2019-11-28 MED ORDER — ACETAMINOPHEN 325 MG PO TABS
650.0000 mg | ORAL_TABLET | Freq: Four times a day (QID) | ORAL | Status: DC | PRN
  Filled 2019-11-28: qty 2

## 2019-11-28 MED ORDER — HYDRALAZINE HCL 50 MG PO TABS
50.0000 mg | ORAL_TABLET | Freq: Three times a day (TID) | ORAL | Status: DC
  Administered 2019-11-28 – 2019-12-01 (×9): 50 mg via ORAL
  Filled 2019-11-28 (×10): qty 1

## 2019-11-28 MED ORDER — SODIUM CHLORIDE 0.9 % IV SOLN: 2.0000 g | INTRAVENOUS | Status: DC

## 2019-11-28 MED ORDER — APIXABAN 5 MG PO TABS
5.0000 mg | ORAL_TABLET | Freq: Two times a day (BID) | ORAL | Status: DC
  Administered 2019-11-28 – 2019-12-01 (×6): 5 mg via ORAL
  Filled 2019-11-28 (×6): qty 1

## 2019-11-28 MED ORDER — ACETAMINOPHEN 650 MG RE SUPP: 650.0000 mg | Freq: Four times a day (QID) | RECTAL | Status: DC | PRN

## 2019-11-28 MED ORDER — FUROSEMIDE 10 MG/ML IJ SOLN
80.0000 mg | Freq: Two times a day (BID) | INTRAMUSCULAR | Status: DC
  Administered 2019-11-29 – 2019-12-01 (×6): 80 mg via INTRAVENOUS
  Filled 2019-11-28 (×6): qty 8

## 2019-11-28 MED ORDER — AMLODIPINE BESYLATE 10 MG PO TABS
10.0000 mg | ORAL_TABLET | Freq: Every day | ORAL | Status: DC
  Administered 2019-11-28 – 2019-11-30 (×3): 10 mg via ORAL
  Filled 2019-11-28 (×3): qty 1

## 2019-11-28 MED ORDER — CLONIDINE HCL 0.3 MG PO TABS
0.3000 mg | ORAL_TABLET | Freq: Two times a day (BID) | ORAL | Status: DC
  Administered 2019-11-28 – 2019-12-01 (×7): 0.3 mg via ORAL
  Filled 2019-11-28 (×7): qty 1

## 2019-11-28 MED ORDER — RAMELTEON 8 MG PO TABS
8.0000 mg | ORAL_TABLET | Freq: Every day | ORAL | Status: DC
  Administered 2019-11-28 – 2019-11-30 (×3): 8 mg via ORAL
  Filled 2019-11-28 (×4): qty 1

## 2019-11-28 MED ORDER — SODIUM CHLORIDE 0.9 % IV SOLN
500.0000 mg | INTRAVENOUS | Status: DC
  Filled 2019-11-28 (×2): qty 500

## 2019-11-28 MED ORDER — HYDROMORPHONE HCL 1 MG/ML IJ SOLN
0.5000 mg | Freq: Once | INTRAMUSCULAR | Status: AC
  Administered 2019-11-28: 0.5 mg via INTRAVENOUS
  Filled 2019-11-28: qty 1

## 2019-11-28 MED ORDER — DEXTROSE 50 % IV SOLN: 1.0000 | INTRAVENOUS | Status: DC | PRN

## 2019-11-28 MED ORDER — GLUCAGON HCL RDNA (DIAGNOSTIC) 1 MG IJ SOLR
1.0000 mg | Freq: Once | INTRAMUSCULAR | Status: DC
  Administered 2019-11-28: 1 mg via INTRAMUSCULAR

## 2019-11-28 MED ORDER — ONDANSETRON HCL 4 MG PO TABS: 4.0000 mg | ORAL_TABLET | Freq: Four times a day (QID) | ORAL | Status: DC | PRN

## 2019-11-28 MED ORDER — POLYETHYLENE GLYCOL 3350 17 G PO PACK: 17.0000 g | PACK | Freq: Every day | ORAL | Status: DC | PRN

## 2019-11-28 MED ORDER — FUROSEMIDE 10 MG/ML IJ SOLN
80.0000 mg | Freq: Once | INTRAMUSCULAR | Status: AC
  Administered 2019-11-28: 80 mg via INTRAVENOUS
  Filled 2019-11-28: qty 8

## 2019-11-28 MED ORDER — SENNOSIDES-DOCUSATE SODIUM 8.6-50 MG PO TABS
1.0000 | ORAL_TABLET | Freq: Every day | ORAL | Status: DC
  Administered 2019-11-29 – 2019-11-30 (×2): 1 via ORAL
  Filled 2019-11-28 (×2): qty 1

## 2019-11-28 MED ORDER — TAMSULOSIN HCL 0.4 MG PO CAPS
0.4000 mg | ORAL_CAPSULE | Freq: Every day | ORAL | Status: DC
  Administered 2019-11-28 – 2019-11-30 (×3): 0.4 mg via ORAL
  Filled 2019-11-28 (×3): qty 1

## 2019-11-28 MED ORDER — DEXTROSE 50 % IV SOLN
1.0000 | Freq: Once | INTRAVENOUS | Status: DC
  Administered 2019-11-28: 50 mL via INTRAVENOUS

## 2019-11-28 NOTE — Assessment & Plan Note (Signed)
Mr. Hebert Soho presents today with complaints of hypoglycemia over the past couple days.  Patient called our clinic 2 days ago, stating that his glucose at the time was 40.  He was strongly recommended to go to the ED, however patient declined.  Today, he was a walk-in at our clinic with complaints of left leg pain and low sugars.  Initial CBG of 33.  He was given 3 4 tablets of glucose, 3 apple juices and patient ate a candy bar he brought with him.  Unfortunately repeat CBG of 18.  Rapid response was called as patient was becoming more altered and dizzy.  IM Glucophage was administered.  IV was started and D50 amp was immediately administered.  Repeat CBG after these interventions was 120.  Of note, Mr. Rosanne Sack states that he took all his prescribed insulin this morning around 8:30 AM and did eat a small breakfast.  His insulin that he took includes Tresiba 96 units, NovoLog 15 units and Victoza.  He knows normally he eats another meal afterwards but did not do so today.  He also notes that in the past couple days he has not been feeling well, and has not been eating or drinking much.  Assessment/plan: Hypoglycemia in the setting of continued insulin use with low p.o. intake.  There is concern hypoglycemia may be triggered by infection given his leg swelling and shortness of breath.    -Admit to inpatient team for further stabilization and work-up

## 2019-11-28 NOTE — H&P (Signed)
Date: 11/28/2019               Patient Name:  [name] MRN: [id number]  DOB: January 03, 1976 Age / Sex: 44 y.o., male   PCP: [name], MD         Medical Service: Internal Medicine Teaching Service         Attending Physician: Dr. Jimmye Norman, Elaina Pattee, MD    First Contact: Dr. Sanjuan Dame Pager: [phone]  Second Contact: Dr. Harvie Heck Pager: [phone]       After Hours (After 5p/  First Contact Pager: [phone]  weekends / holidays): Second Contact Pager: [phone]   Chief Complaint: Fatigue  History of Present Illness:  [name] is 44 yo M w/ PMH of CKD4, HFpEF, T2DM, HTN, DVT/PE on eliquis, HLD, PVD s/p R AKA presenting to [facility] w/ fatigue, nausea and vomiting. He was in his usual state of health until about 2 weeks ago when he began to have episodes of hypoglycemia. He mentions that he noticed that blood glucometer readings around 40s and stopped using his novolog during mornings when he exercises to try to avoid additional hypoglycemic events. He did not make any changes to his [place]. He mentions that during these two weeks he noticed progressively worsening lower extremity edema, orthopnea, and dyspnea with exertion. He called the clinic for assistance and was advised to go to ED for evaluation but chose to come to clinic the next day instead. Overnight he had episode of nausea and NBNB emesis. In the clinic, he was noted to have blood glucose of 18 and IMTS consulted for admission.  On chart review, his last appointment with the nephrologist does not show any changes to his diuretic regimen but he mentions stopped taking his metolazone as he said he was instructed to do so by his kidney doctors. He also had follow up with vascular surgery for vein mapping in preparation for dialysis access in setting of worsening renal function.  Meds:  Acidophilus 1 capsule daily Allopurinol 200mg  dialy Amlodipine 10mg  daily Calcitriol 2.41mcg daily Carvedilol 25mg   BID Clonidine 0.3mg  daily Eliquis 5mg  BID Famotidine 20mg  daily Furosemide 80mg  BID Gabapentin 100mg  TID Hydralazine 50mg  Four times daily Lovastatin 20mg  daily Melatonin 10mg  dialy Metolazone 2.5mg  daily Oxybutynin 5mg  daily Tamsulosin 0.4mg  daily Victoza 1.2mg  daily Tresiba 96 units daily Novolog 25 units TID qc   Allergies: Allergies as of 11/28/2019 - Review Complete 11/28/2019  Allergen Reaction Noted  . Reglan [metoclopramide] Other (See Comments) 11/01/2016  . Metformin and related Diarrhea 08/19/2019  . Ozempic (0.25 or 0.5 mg-dose) [semaglutide(0.25 or 0.5mg -dos)] Nausea And Vomiting 08/19/2019   Past Medical History:  Diagnosis Date  . Acute venous embolism and thrombosis of deep vessels of proximal lower extremity ([place]) 07/19/2011  . Anemia   . Anginal pain ([place])    pt denies  . Anxiety   . Chest pain, neg MI, normal coronaries by cath 02/18/2013   pt denies  . CHF (congestive heart failure) ([place])   . CKD (chronic kidney disease) stage 3, GFR 30-59 ml/min (HCC) 02/19/2013  . Colon polyps   . Depression   . Diabetic ulcer of right foot ([place])   . DVT (deep venous thrombosis) ([place]) 09/2002   patient reports additional DVTs in '06 & '11 (unconfirmed)  . ED (erectile dysfunction)   . GERD (gastroesophageal reflux disease)   . History of blood transfusion    "related to OR" (10/31/2016)  . Hyperlipidemia 02/19/2013  . Hypertension   .  Nausea & vomiting    "constant for the last couple weeks" (10/31/2016)  . Nephrotic syndrome   . Obesity    BMI 44, weight 346 pounds 01/30/14  . OSA (obstructive sleep apnea) 02/28/2018   Mild obstructive sleep apnea with an AHI of 9.8/h but severe during REM sleep with an AHI of 33.8/h.  Oxygen saturations dropped to 86% and there was moderate snoring  . Peripheral edema   . Pneumonia   . Prosthesis adjustment 08/17/2016  . Pulmonary embolism ([place]) 09/2002   treated with 6 months of warfarin  . Pyelonephritis 02/02/2018  . Type I  diabetes mellitus ([place]) dx'd 2001  . Urinary retention     Family History:  Mother with heart dz, diabetes  Social History:  Lives alone. Goes to gym daily. On disability. Denies tobacco, illicit substance use or current alcohol use.  Review of Systems: A complete ROS was negative except as per HPI.  Physical Exam: Blood pressure (!) 171/81, pulse (!) 105, temperature 98.1 F (36.7 C), temperature source Oral, resp. rate 20, SpO2 91 %.  Gen: Well-developed, morbidly obese, ill-appearing HEENT: NCAT head, hearing intact, EOMI, MMM Neck: supple, ROM intact, + JVD CV: RRR, S1, S2 normal, no murmurs Pulm: Bilateral rales up to mid lung, increased respiratory effort Abd: Soft, BS+, suprapubic tenderness to palpation, + abdominal wall edema Extm: 2+ pitting edema up thigh on LLE without tenderness, stable R AKA Skin: Dry, Warm, normal turgor Neuro: AAOx3 Psych: Appropriate mood and affect  EKG: personally reviewed my interpretation is pending  CXR: personally reviewed my interpretation is pending  Assessment & Plan by Problem: Principal Problem:   Hypoglycemia  [name] is 44 yo M w/ PMH of CKD4, HFpEF, T2DM, HTN, DVT/PE on eliquis, HLD, PVD s/p R AKA presenting to [facility] w/ fatigue, nausea and vomiting due to hypoglycemia after insulin administration.  Hypoglycemia  Chronic Kidney Disease Stage 4 Type 2 Diabetes Mellitus Present w/ cgb of 18, up to 114 after glucagon and D50 administration. On 96 units of long acting insulin at home. Previously had hyperglycemic episodes on current regimen on prior hospitalizations but have been recurrent hypoglycemic events (noted multiple readings in 40s on personal glucometer). Noted to have worsening renal dz currently undergoing work-up for transition to dialysis. Likely having increased insulin sensitivity with worsening renal function. Also possible infectious etiology with hx of recurrent cystitis although afebrile, no obvious sign of  infection. While his hypoglycemia has currently resolved, with multiple co-morbidiites including CKD4, HFpEF, uncontrolled DM, at high risk for decompensation, reasonable to admit for further work-up and observation. - UA, Chest X-ray - Stat cbc, cmp - Hold home insulin - SSI, glucose checks - Hypoglycemic protocol  Acute on chronic diastolic heart failure On exam noted significant hypervolemia with rales on lung examd and lower extremity edema. Prior known dry weight at 151kg. Current weight pending. O2 sat at 91. Previously on furosemide 80mg , metolazone 2.5mg  every other day. Had stopped metolazone at home. Bnp, chest x-ray pending - F/u bnp, chest X-ray - Start IV furosemide 80mg  TID - Trend electrolytes, replete as needed - Strict I&Os - Daily Weights - Fluid restriction - Keep O2 sat >88 - Replenish K as needed >4.0 - C/w home med: carvedilol 25mg  BID  Hypertension Admit bp 171/81. On clonidine, hydralazine, amlodipine at home - C/w home meds  BPH On oxybutynin, tamsulosin ath ome - C/w home meds  Gout  On allopurinol at home. No obvious sign of active gout on exam -  Hold while assessing renal fx.   Hx of DVT/PE Has hx of multiple DVTs in early 2000s. On eliquis at home. Has leg swelling due to edema. 2 points on [place] due to prior hx - F/u d-dimer to assess for tx failure - C/w home eliquis  DVT prophx: eliquis Diet: Low salt Bowel: Sen[place]ot Code: Full  Prior to Admission Living Arrangement: Home Anticipated Discharge Location: Home Barriers to Discharge: Medical work-up  Dispo: Admit patient to Observation with expected length of stay less than 2 midnights.  Signed: Mosetta Anis, MD 11/28/2019, 12:25 PM Pager: [phone] After 5pm on weekdays and 1pm on weekends: On Call Pager: [phone]

## 2019-11-28 NOTE — Assessment & Plan Note (Signed)
[name] states that he has been having approximately 2-week history of left lower extremity swelling without any increased pain.  Over the past few days, it has affected his ability to mobilize or work out at [employer], which she does do every morning.  He notes a history of CHF, DVTs.  [name] also endorses shortness of breath at rest, palpitations, dizziness, malaise, decreased appetite but denies any chest pain, cough.  Assessment/plan: On examination, patient's extremity is severely enlarged with nonpitting edema.  No erythema, lesions, rashes to suggest cellulitis.  In light of his shortness of breath over the past couple days, differential includes CHF exacerbation, DVT.   -Unstable due to hypoglycemia, will continue work-up once admitted -D-dimer has been ordered, if positive would recommend venous duplex -Would also recommend adding a BNP

## 2019-11-28 NOTE — Progress Notes (Signed)
Hypoglycemic Event  CBG: 31  Treatment: Glucose tablets (10) given ( pt refused glucose gel) ; apple juice x 2.  Symptoms: Not feeling well. Vomited earlier..  Follow-up CBG: Time:1048 CBG Result: 18  Possible Reasons for Event: stated he took his insulin this morning. Also c/o left leg swelling. Comments/MD notified: Yes, Dr Charleen Kirks. Also Rapid Response was called.    Morrison Old H

## 2019-11-28 NOTE — Significant Event (Signed)
Rapid Response Event Note   Reason for Call :  Hypoglycemia- CBG 18 following intervention  Initial Focused Assessment:  Pt lying on stretcher. Responds to voice, oriented. Denies pain, lightheadedness, dizziness. Breathing is unlabored. He endorses abdominal tension, abdomen is soft. Skin is pink, warm/dry.   VS: T 98.17F, HR 103, RR 18, SpO2 99% on room air  Interventions:  -1048: CBG 18 -1050: CBG 20 -1100: Glucagon IM given to L deltoid -1104: IV access established- 20g to right AC -1105: 25g D50 given via right AC IV -1120: CBG 120  Plan of Care:  -Admit to med-surg  Event Summary:  MD Notified: Dr. Heber [place] Call Time: 1427 Arrival Time: 6701 End Time: [place], RN

## 2019-11-28 NOTE — Progress Notes (Addendum)
Hypoglycemic Event  1052 Rapid Response called for CBG 18 (Patient already receiving glucose tabs/apple juice) Patient lucid, able to move himself from exam table to stretcher. 1057 Rapid response arrived 1100 Glucagon 1 mg given IM left deltoid by Daleen Squibb, RN [phone] IV started right antecub by Nat Christen, RN 1104 IV normal saline running open 1105 1 amp D50 given via IV by Daleen Squibb, RN 1110 Bed obtained on 1M Rm 01 by Sherle Poe, RN [facility]) ok'd by Dr. Hoffman/Attending Dr. Rebeca Alert [place] CBG 120 1125 Patient escorted to 5 M via stretcher accompanied by Daleen Squibb, RN, Nat Christen, RN, and Matilde Haymaker, MD (Resident)  Also present: Morrison Old, RN Dr. Charleen Kirks, MD (Resident) Dr. Heber Billings, DO (Attending) Yvonna Alanis, RN (Clinic Director) Howell Rucks, RN (Recorder)

## 2019-11-28 NOTE — Progress Notes (Signed)
Internal Medicine Clinic Attending  I saw and evaluated the patient.  I personally confirmed the key portions of the history and exam documented by Dr. Charleen Kirks and I reviewed pertinent patient test results.  The assessment, diagnosis, and plan were formulated together and I agree with the documentation in the resident's note.  I also personally saw [name].  He reports to me for the past week he is had intermittent low blood sugars.  He notes this morning he took 96 units of Tresiba, 1.8 mg of Victoza and 15 units of NovoLog he ate "normal breakfast".  On presentation to the clinic he felt his sugar was a little low.  CBG revealed glucose of 31.  We gave him oral glucose tabs and glucose gel, repeat sugar about 30 minutes later was down to 18.  This point we followed a rapid response administered IM glucagon.  Then obtained IV access started normal saline to maintain access and administered 1 amp of D50.  We have requested a bed for hospital admission.  Given his tachycardia and hypoglycemia I do want an infectious work-up, he has had a mild non productive cough, his left leg is also more swollen than usual.

## 2019-11-28 NOTE — Progress Notes (Signed)
Per MD, hold antibiotics for now, and to notify if patient spikes a fever. Will continue to monitor.

## 2019-11-28 NOTE — Progress Notes (Addendum)
CC: left leg swelling   HPI:  [name] is a 44 y.o. with a PMHx as listed below who presents to the clinic for left leg swelling.   Please see the Encounters tab for problem-based Assessment & Plan regarding status of patient's acute and chronic conditions.  Past Medical History:  Diagnosis Date  . Acute venous embolism and thrombosis of deep vessels of proximal lower extremity ([place]) 07/19/2011  . Anemia   . Anginal pain ([place])    pt denies  . Anxiety   . Chest pain, neg MI, normal coronaries by cath 02/18/2013   pt denies  . CHF (congestive heart failure) ([place])   . CKD (chronic kidney disease) stage 3, GFR 30-59 ml/min (HCC) 02/19/2013  . Colon polyps   . Depression   . Diabetic ulcer of right foot ([place])   . DVT (deep venous thrombosis) ([place]) 09/2002   patient reports additional DVTs in '06 & '11 (unconfirmed)  . ED (erectile dysfunction)   . GERD (gastroesophageal reflux disease)   . History of blood transfusion    "related to OR" (10/31/2016)  . Hyperlipidemia 02/19/2013  . Hypertension   . Nausea & vomiting    "constant for the last couple weeks" (10/31/2016)  . Nephrotic syndrome   . Obesity    BMI 44, weight 346 pounds 01/30/14  . OSA (obstructive sleep apnea) 02/28/2018   Mild obstructive sleep apnea with an AHI of 9.8/h but severe during REM sleep with an AHI of 33.8/h.  Oxygen saturations dropped to 86% and there was moderate snoring  . Peripheral edema   . Pneumonia   . Prosthesis adjustment 08/17/2016  . Pulmonary embolism ([place]) 09/2002   treated with 6 months of warfarin  . Pyelonephritis 02/02/2018  . Type I diabetes mellitus ([place]) dx'd 2001  . Urinary retention    Review of Systems: Review of Systems  Constitutional: Positive for malaise/fatigue. Negative for chills, fever and weight loss.  Respiratory: Positive for shortness of breath. Negative for cough and wheezing.   Cardiovascular: Positive for palpitations and leg swelling. Negative for chest  pain.  Gastrointestinal: Positive for nausea. Negative for abdominal pain and vomiting.  Skin: Negative for itching and rash.  Neurological: Positive for dizziness and weakness. Negative for focal weakness and headaches.   Physical Exam:  Vitals:   11/28/19 0958 11/28/19 1001  BP: (!) 146/83 (!) 195/79  Pulse: (!) 102 (!) 103  Temp: 98.2 F (36.8 C)   TempSrc: Oral   SpO2: 94% 99%  Height: 6\' 2"  (1.88 m)    Physical Exam Vitals reviewed.  Constitutional:      Appearance: He is morbidly obese. He is ill-appearing.  HENT:     Head: Normocephalic and atraumatic.  Cardiovascular:     Rate and Rhythm: Regular rhythm. Tachycardia present.     Heart sounds: No murmur heard.   Pulmonary:     Effort: Pulmonary effort is normal. No respiratory distress.     Breath sounds: No wheezing, rhonchi or rales.  Abdominal:     General: Bowel sounds are normal.     Tenderness: There is no abdominal tenderness.  Musculoskeletal:     Left knee: Swelling (non-pitting edema up to the knee that is severe) present. No effusion, erythema, ecchymosis, lacerations or bony tenderness. No tenderness.     Right Lower Extremity: Right leg is amputated above knee.  Skin:    General: Skin is warm and dry.     Findings: No erythema or rash.  Neurological:     Mental Status: He is alert.     Comments: Lethargic at times, but wakes easily    Assessment & Plan:   See Encounters Tab for problem based charting.  Problem List Items Addressed This Visit      Endocrine   Hypoglycemic event in diabetes [facility]) - Primary    [name] presents today with complaints of hypoglycemia over the past couple days.  Patient called our clinic 2 days ago, stating that his glucose at the time was 40.  He was strongly recommended to go to the ED, however patient declined.  Today, he was a walk-in at our clinic with complaints of left leg pain and low sugars.  Initial CBG of 33.  He was given 3 4 tablets of glucose, 3 apple  juices and patient ate a candy bar he brought with him.  Unfortunately repeat CBG of 18.  Rapid response was called as patient was becoming more altered and dizzy.  IM Glucophage was administered.  IV was started and D50 amp was immediately administered.  Repeat CBG after these interventions was 120.  Of note, [name] states that he took all his prescribed insulin this morning around 8:30 AM and did eat a small breakfast.  His insulin that he took includes Tresiba 96 units, NovoLog 15 units and Victoza.  He knows normally he eats another meal afterwards but did not do so today.  He also notes that in the past couple days he has not been feeling well, and has not been eating or drinking much.  Assessment/plan: Hypoglycemia in the setting of continued insulin use with low p.o. intake.  There is concern hypoglycemia may be triggered by infection given his leg swelling and shortness of breath.    -Admit to inpatient team for further stabilization and work-up        Other   Leg swelling    [name] states that he has been having approximately 2-week history of left lower extremity swelling without any increased pain.  Over the past few days, it has affected his ability to mobilize or work out at [employer], which she does do every morning.  He notes a history of CHF, DVTs.  [name] also endorses shortness of breath at rest, palpitations, dizziness, malaise, decreased appetite but denies any chest pain, cough.  Assessment/plan: On examination, patient's extremity is severely enlarged with nonpitting edema.  No erythema, lesions, rashes to suggest cellulitis.  In light of his shortness of breath over the past couple days, differential includes CHF exacerbation, DVT.   -Unstable due to hypoglycemia, will continue work-up once admitted -D-dimer has been ordered, if positive would recommend venous duplex -Would also recommend adding a BNP         Patient discussed with [name]

## 2019-11-28 NOTE — Progress Notes (Signed)
NEW ADMISSION NOTE New Admission Note:   Arrival Method: via stretcher from IMTS accompanied by Rapid RN and MD Mental Orientation: A&Ox4 Telemetry: No Assessment: Completed Skin: warm and dry IV: R AC NSL Pain: 0-10 Tubes: none Safety Measures: Safety Fall Prevention Plan has been given, discussed and signed Admission: in progress 5 [place] Orientation: Patient has been orientated to the room, unit and staff.  Family: none a t bedside  Orders have been reviewed and implemented. Will continue to monitor the patient. Call light has been placed within reach and bed alarm has been activated.   Orville Govern, RN

## 2019-11-28 NOTE — Progress Notes (Signed)
Lower extremity venous LT study completed.  Preliminary results relayed to RN at bedside.   See CV Proc for preliminary results report.   Darlin Coco, RDMS

## 2019-11-28 NOTE — Addendum Note (Signed)
Addended by: Jose Persia on: 11/28/2019 02:21 PM   Modules accepted: Orders

## 2019-11-29 ENCOUNTER — Encounter (HOSPITAL_COMMUNITY): Payer: Self-pay | Admitting: Internal Medicine

## 2019-11-29 DIAGNOSIS — Z6841 Body Mass Index (BMI) 40.0 and over, adult: Secondary | ICD-10-CM | POA: Diagnosis not present

## 2019-11-29 DIAGNOSIS — Z794 Long term (current) use of insulin: Secondary | ICD-10-CM | POA: Diagnosis not present

## 2019-11-29 DIAGNOSIS — E11649 Type 2 diabetes mellitus with hypoglycemia without coma: Secondary | ICD-10-CM | POA: Diagnosis not present

## 2019-11-29 DIAGNOSIS — F419 Anxiety disorder, unspecified: Secondary | ICD-10-CM | POA: Diagnosis present

## 2019-11-29 DIAGNOSIS — E162 Hypoglycemia, unspecified: Secondary | ICD-10-CM | POA: Diagnosis not present

## 2019-11-29 DIAGNOSIS — K5900 Constipation, unspecified: Secondary | ICD-10-CM | POA: Diagnosis present

## 2019-11-29 DIAGNOSIS — N184 Chronic kidney disease, stage 4 (severe): Secondary | ICD-10-CM | POA: Diagnosis not present

## 2019-11-29 DIAGNOSIS — Z20822 Contact with and (suspected) exposure to covid-19: Secondary | ICD-10-CM | POA: Diagnosis not present

## 2019-11-29 DIAGNOSIS — I130 Hypertensive heart and chronic kidney disease with heart failure and stage 1 through stage 4 chronic kidney disease, or unspecified chronic kidney disease: Secondary | ICD-10-CM | POA: Diagnosis not present

## 2019-11-29 DIAGNOSIS — N400 Enlarged prostate without lower urinary tract symptoms: Secondary | ICD-10-CM | POA: Diagnosis not present

## 2019-11-29 DIAGNOSIS — G4733 Obstructive sleep apnea (adult) (pediatric): Secondary | ICD-10-CM | POA: Diagnosis not present

## 2019-11-29 DIAGNOSIS — M109 Gout, unspecified: Secondary | ICD-10-CM | POA: Diagnosis present

## 2019-11-29 DIAGNOSIS — Z79899 Other long term (current) drug therapy: Secondary | ICD-10-CM | POA: Diagnosis not present

## 2019-11-29 DIAGNOSIS — E1122 Type 2 diabetes mellitus with diabetic chronic kidney disease: Secondary | ICD-10-CM | POA: Diagnosis not present

## 2019-11-29 DIAGNOSIS — E1165 Type 2 diabetes mellitus with hyperglycemia: Secondary | ICD-10-CM | POA: Diagnosis not present

## 2019-11-29 DIAGNOSIS — Z7901 Long term (current) use of anticoagulants: Secondary | ICD-10-CM | POA: Diagnosis not present

## 2019-11-29 DIAGNOSIS — Z89611 Acquired absence of right leg above knee: Secondary | ICD-10-CM | POA: Diagnosis not present

## 2019-11-29 DIAGNOSIS — I5033 Acute on chronic diastolic (congestive) heart failure: Secondary | ICD-10-CM | POA: Diagnosis not present

## 2019-11-29 DIAGNOSIS — D631 Anemia in chronic kidney disease: Secondary | ICD-10-CM | POA: Diagnosis present

## 2019-11-29 DIAGNOSIS — F32A Depression, unspecified: Secondary | ICD-10-CM | POA: Diagnosis present

## 2019-11-29 DIAGNOSIS — R1084 Generalized abdominal pain: Secondary | ICD-10-CM | POA: Diagnosis not present

## 2019-11-29 DIAGNOSIS — E785 Hyperlipidemia, unspecified: Secondary | ICD-10-CM | POA: Diagnosis present

## 2019-11-29 DIAGNOSIS — N390 Urinary tract infection, site not specified: Secondary | ICD-10-CM | POA: Diagnosis not present

## 2019-11-29 DIAGNOSIS — N179 Acute kidney failure, unspecified: Secondary | ICD-10-CM | POA: Diagnosis not present

## 2019-11-29 DIAGNOSIS — K219 Gastro-esophageal reflux disease without esophagitis: Secondary | ICD-10-CM | POA: Diagnosis not present

## 2019-11-29 DIAGNOSIS — E1151 Type 2 diabetes mellitus with diabetic peripheral angiopathy without gangrene: Secondary | ICD-10-CM | POA: Diagnosis present

## 2019-11-29 DIAGNOSIS — N3289 Other specified disorders of bladder: Secondary | ICD-10-CM | POA: Diagnosis not present

## 2019-11-29 DIAGNOSIS — R109 Unspecified abdominal pain: Secondary | ICD-10-CM | POA: Diagnosis not present

## 2019-11-29 LAB — CBC WITH DIFFERENTIAL/PLATELET
Abs Immature Granulocytes: 0.05 10*3/uL (ref 0.00–0.07)
Basophils Absolute: 0.1 10*3/uL (ref 0.0–0.1)
Basophils Relative: 0 %
Eosinophils Absolute: 0.2 10*3/uL (ref 0.0–0.5)
Eosinophils Relative: 2 %
HCT: 23 % — ABNORMAL LOW (ref 39.0–52.0)
Hemoglobin: 7.1 g/dL — ABNORMAL LOW (ref 13.0–17.0)
Immature Granulocytes: 0 %
Lymphocytes Relative: 17 %
Lymphs Abs: 2 10*3/uL (ref 0.7–4.0)
MCH: 27.7 pg (ref 26.0–34.0)
MCHC: 30.9 g/dL (ref 30.0–36.0)
MCV: 89.8 fL (ref 80.0–100.0)
Monocytes Absolute: 0.9 10*3/uL (ref 0.1–1.0)
Monocytes Relative: 8 %
Neutro Abs: 8.1 10*3/uL — ABNORMAL HIGH (ref 1.7–7.7)
Neutrophils Relative %: 73 %
Platelets: 313 10*3/uL (ref 150–400)
RBC: 2.56 MIL/uL — ABNORMAL LOW (ref 4.22–5.81)
RDW: 15.8 % — ABNORMAL HIGH (ref 11.5–15.5)
WBC: 11.3 10*3/uL — ABNORMAL HIGH (ref 4.0–10.5)
nRBC: 0 % (ref 0.0–0.2)

## 2019-11-29 LAB — RENAL FUNCTION PANEL
Albumin: 2.6 g/dL — ABNORMAL LOW (ref 3.5–5.0)
Anion gap: 11 (ref 5–15)
BUN: 91 mg/dL — ABNORMAL HIGH (ref 6–20)
CO2: 21 mmol/L — ABNORMAL LOW (ref 22–32)
Calcium: 8.8 mg/dL — ABNORMAL LOW (ref 8.9–10.3)
Chloride: 106 mmol/L (ref 98–111)
Creatinine, Ser: 4.41 mg/dL — ABNORMAL HIGH (ref 0.61–1.24)
GFR, Estimated: 16 mL/min — ABNORMAL LOW (ref >60)
Glucose, Bld: 81 mg/dL (ref 70–99)
Phosphorus: 4.7 mg/dL — ABNORMAL HIGH (ref 2.5–4.6)
Potassium: 3.9 mmol/L (ref 3.5–5.1)
Sodium: 138 mmol/L (ref 135–145)

## 2019-11-29 LAB — GLUCOSE, CAPILLARY
Glucose-Capillary: 100 mg/dL — ABNORMAL HIGH (ref 70–99)
Glucose-Capillary: 116 mg/dL — ABNORMAL HIGH (ref 70–99)
Glucose-Capillary: 125 mg/dL — ABNORMAL HIGH (ref 70–99)
Glucose-Capillary: 139 mg/dL — ABNORMAL HIGH (ref 70–99)
Glucose-Capillary: 72 mg/dL (ref 70–99)
Glucose-Capillary: 85 mg/dL (ref 70–99)

## 2019-11-29 LAB — MAGNESIUM: Magnesium: 2.2 mg/dL (ref 1.7–2.4)

## 2019-11-29 LAB — SODIUM, URINE, RANDOM: Sodium, Ur: 36 mmol/L

## 2019-11-29 LAB — CREATININE, URINE, RANDOM: Creatinine, Urine: 98.16 mg/dL

## 2019-11-29 MED ORDER — SODIUM CHLORIDE 0.9 % IV SOLN
500.0000 mg | INTRAVENOUS | Status: DC
  Administered 2019-11-29: 500 mg via INTRAVENOUS
  Filled 2019-11-29 (×2): qty 500

## 2019-11-29 MED ORDER — SODIUM CHLORIDE 0.9 % IV SOLN
2.0000 g | INTRAVENOUS | Status: DC
  Administered 2019-11-29 – 2019-12-01 (×3): 2 g via INTRAVENOUS
  Filled 2019-11-29 (×3): qty 20

## 2019-11-29 MED ORDER — DEXTROSE 50 % IV SOLN
INTRAVENOUS | Status: AC
  Filled 2019-11-29: qty 50

## 2019-11-29 NOTE — Progress Notes (Signed)
PT Cancellation Note  Patient Details Name: [name] MRN: [id number] DOB: Sep 02, 1975   Cancelled Treatment:    Reason Eval/Treat Not Completed: Patient declined, no reason specified. Pt requesting to defer evaluation until tomorrow in hopes that he can be disconnected from his IV at the time of evaluation. PT called pt's RN to ask if he could be disconnected now, but unfortunately he cannot be at this time. PT will follow back up with pt tomorrow.    [name] 11/29/2019, 1:04 PM

## 2019-11-29 NOTE — Progress Notes (Signed)
   11/28/19 2126  Pain Assessment  Pain Scale 0-10  Pain Score 7  Pain Type Acute pain  Pain Location Abdomen  Pain Orientation Right;Left;Upper;Lower  Pain Descriptors / Indicators Throbbing  Pain Frequency Constant  Pain Onset On-going  Patients Stated Pain Goal 0  Pain Intervention(s) MD notified (Comment);Emotional support  Provider Notification  Provider Name/Title Internal Medicine MD  Date Provider Notified 11/28/19  Time Provider Notified 2126  Notification Type Page  Notification Reason Requested by patient/family (Wants IV Dilaudid/Sleep Medication)  Response See new orders  Date of Provider Response 11/29/19   Patient is complaining of diffuse throbbing abdominal pain.  He just had a bowel movement.  He rates his pain as 7/10 and states it has been hurting for several days.  MD called and made aware.  Also, made aware of results of urinalysis sample.  Per patient, he is not having burning or pain with urination.  Patient also wanted something for sleep.  He states home Melatonin is not working.  Order received for Remeron and IV Dilaudid.  Both were given at 2209.  Patient is currently sleeping.  CBG checked and is stable at 100.  Will continue to monitor patient.  Earleen Reaper RN

## 2019-11-29 NOTE — Discharge Instructions (Signed)
Information on my medicine - ELIQUIS (apixaban)  This medication education was reviewed with me or my healthcare representative as part of my discharge preparation.  The pharmacist that spoke with me during my hospital stay was:  Onnie Boer, RPH-CPP  Why was Eliquis prescribed for you? Eliquis was prescribed to treat blood clots that may have been found in the veins of your legs (deep vein thrombosis) or in your lungs (pulmonary embolism) and to reduce the risk of them occurring again.  What do You need to know about Eliquis ? Take 5 mg tablet taken TWICE daily.  Eliquis may be taken with or without food.   Try to take the dose about the same time in the morning and in the evening. If you have difficulty swallowing the tablet whole please discuss with your pharmacist how to take the medication safely.  Take Eliquis exactly as prescribed and DO NOT stop taking Eliquis without talking to the doctor who prescribed the medication.  Stopping may increase your risk of developing a new blood clot.  Refill your prescription before you run out.  After discharge, you should have regular check-up appointments with your healthcare provider that is prescribing your Eliquis.    What do you do if you miss a dose? If a dose of ELIQUIS is not taken at the scheduled time, take it as soon as possible on the same day and twice-daily administration should be resumed. The dose should not be doubled to make up for a missed dose.  Important Safety Information A possible side effect of Eliquis is bleeding. You should call your healthcare provider right away if you experience any of the following: ? Bleeding from an injury or your nose that does not stop. ? Unusual colored urine (red or dark brown) or unusual colored stools (red or black). ? Unusual bruising for unknown reasons. ? A serious fall or if you hit your head (even if there is no bleeding).  Some medicines may interact with Eliquis and might  increase your risk of bleeding or clotting while on Eliquis. To help avoid this, consult your healthcare provider or pharmacist prior to using any new prescription or non-prescription medications, including herbals, vitamins, non-steroidal anti-inflammatory drugs (NSAIDs) and supplements.  This website has more information on Eliquis (apixaban): http://www.eliquis.com/eliquis/home

## 2019-11-29 NOTE — Plan of Care (Signed)
  Problem: Health Behavior/Discharge Planning: Goal: Ability to manage health-related needs will improve Outcome: Progressing   

## 2019-11-29 NOTE — Progress Notes (Signed)
Subjective: HD1 for Mr [name] who is a 44 year old male with PMHx of HFpEF, type II DM, DVT/PE on Eliquis, PVD s/p R AKA, and CKDIV admitted for hypoglycemic episodes and acute on chronic diastolic heart failure.   Overnight, patient with diffuse abdominal pain that has been present for several days. He was given remeron for sleep and also dilaudid for abdominal pain with improvement.   Mr [name] was evaluated at bedside this morning. States trying to wrap his head around this situation as thinks was doing all the right things- taking medications, loosing weight, going to gym. Endorses dry cough, SOB. Discussed that this is likely secondary to his pneumonia. He endorses prior hospitalizations for this. He endorses having "Too much on mind" and does not have appetite. Ate his dinner last night. " Trying to figure out what's going on, what I am doing wrong". " Have a lot to look forward to". 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 Wants to work with PT/OT. Endorses throbbing abdominal pain, no aggravating or alleviating factors. Dilaudid helped with pain last night.   Objective:  Vital signs in last 24 hours: Vitals:   11/28/19 1327 11/28/19 1713 11/28/19 2118 11/29/19 0457  BP:  (!) 144/78 131/72 (!) 151/77  Pulse:  99 95 90  Resp:  20 18 18   Temp:  99.6 F (37.6 C) 98.7 F (37.1 C) 97.7 F (36.5 C)  TempSrc:  Oral Oral Oral  SpO2:  99% 94% 95%  Weight: (!) 375 lb (170.1 kg)  (!) 375 lb (170.1 kg)   Height:   6\' 2"  (1.88 m)    CBC Latest Ref Rng & Units 11/29/2019 11/28/2019 11/06/2019  WBC 4.0 - 10.5 K/uL 11.3(H) 18.8(H) -  Hemoglobin 13.0 - 17.0 g/dL 7.1(L) 7.6(L) 9.7(L)  Hematocrit 39 - 52 % 23.0(L) 24.2(L) -  Platelets 150 - 400 K/uL 313 318 -   BMP Latest Ref Rng & Units 11/29/2019 11/28/2019 11/06/2019  Glucose 70 - 99 mg/dL 81 120(H) 141(H)  BUN 6 - 20 mg/dL 91(H) 87(H) 51(H)  Creatinine 0.61 - 1.24 mg/dL 4.41(H) 4.31(H) 3.19(H)    BUN/Creat Ratio 9 - 20 - - -  Sodium 135 - 145 mmol/L 138 137 138  Potassium 3.5 - 5.1 mmol/L 3.9 3.8 4.3  Chloride 98 - 111 mmol/L 106 107 105  CO2 22 - 32 mmol/L 21(L) 19(L) 23  Calcium 8.9 - 10.3 mg/dL 8.8(L) 9.0 9.3   Physical Exam  Constitutional: Obese male, sitting on edge of bed. No distress.  HENT: Normocephalic and atraumatic, EOMI,  moist mucous membranes Cardiovascular: Normal rate, regular rhythm, S1 and S2 present, no murmurs, rubs, gallops.  Distal pulses intact Respiratory: No respiratory distress, no accessory muscle use.  Effort is normal. Bibasilar crackles; on room air  GI: Nondistended, soft, + bowel sounds; tenderness to palpation in RLQ and epigastric region  Musculoskeletal: R AKA; left leg with significant lymphedema and 2+ pitting edema to tip of tibia  Neurological: Is alert and oriented x4, no apparent focal deficits noted. Skin: Warm and dry.  No rash, erythema, lesions noted. Psychiatric: Depressed mood and tearful affect. Behavior is normal. Judgment and thought content normal.   Assessment/Plan:  Principal Problem:   Hypoglycemia  [name] is 44 yo M w/ PMH of CKD4, HFpEF, T2DM, HTN, DVT/PE on eliquis, HLD, PVD s/p R AKA admitted for hypoglycemia in setting of CAP vs UTI and acute on chronic HFpEF with acute on chronic renal  failure.   Hypoglycemia Type II DM Patient's hypoglycemia improved with holding his insulin. However, continues to have poor oral intake and currently not requiring insulin to maintain CBGs. He was noted to have CXR consistent with possible pneumonia and urinalysis significant for bacteruria, leukocytes and WBC clumps. He denies dysuria but does note that he is not urinating as frequently as before. His hypoglycemia may be secondary to decreasing insulin requirements in setting of weight loss vs infection. - Continue to hold home insulin - CBG checks with SSI as needed - Hypoglycemic protocol - Azithromycin and ceftriaxone day 2  for CAP and UTI  AKI on CKDIV UTI:  Patient noted to have worsening renal function with sCr to 4.41 (baseline 3.14). He does note that he has not been urinating as much as previously but also endorses that his metolazone was recently discontinued. Suspect his AKI may be in setting of his acute HF exacerbation vs UTI vs progression of renal disease.  - IV diuresis - Urine lytes - Ceftriaxone day 2 as above - Strict I&O  - Avoid nephrotoxic agents as able   Acute on chronic HFpEF:  Patient appears significantly hypervolemic on examination with 2+ pitting edema to tip of left tibia and continued bibasilar crackles on auscultation. He has received IV lasix and had ~900cc UOP over past 24 hours. Weight is 170kg. Unknown dry weight at this time as he endorses ongoing weight loss efforts. - Continue IV diuresis with Lasix 80mg  bid  - Strict I&O - Daily weights  Abdominal pain:  Patient endorses abdominal pain that has been constant for several weeks to months. He initially attributed this to working out and endorses that it is musculoskeletal and some times deeper in nature and feels like a throbbing pain. He does not believe this is related to his food intake. Denies any fatty meals. CT Abdomen/Pelvis from 8/29 without any acute intraabdominal pathology. On examination had mild tenderness to palpation of epigastric and RLQ region. He did have nausea/vomiting on presentation. He has had a bowel movement since admission but no diarrhea. He received IV dilaudid overnight with improvement in his pain. - Continue to monitor - Consider imaging if continues to worsen  Hx of DVT/ PE Patient is on Eliquis for this. On admission, left leg swelling noted on examination. D-dimer elevated for which [place] of left leg obtained. Negative for DVT. - Continue Eliquis 5mg  bid   Diet: Low sodium Fluids: None DVT Prophylaxis: Eliquis  Code status: FULL   Prior to Admission Living Arrangement: Home  Anticipated  Discharge Location: Home w/ HH vs SNF  Barriers to Discharge: Continued medical management  Dispo: Anticipated discharge in approximately 2-3 day(s).   Harvie Heck, MD Internal Medicine, PGY-2 11/29/19 5:39 PM Pager # [phone] After 5pm on weekdays and 1pm on weekends: On Call pager [phone]

## 2019-11-30 ENCOUNTER — Inpatient Hospital Stay (HOSPITAL_COMMUNITY): Payer: Medicare Other

## 2019-11-30 DIAGNOSIS — Z794 Long term (current) use of insulin: Secondary | ICD-10-CM

## 2019-11-30 DIAGNOSIS — N390 Urinary tract infection, site not specified: Secondary | ICD-10-CM

## 2019-11-30 DIAGNOSIS — N179 Acute kidney failure, unspecified: Secondary | ICD-10-CM | POA: Diagnosis not present

## 2019-11-30 DIAGNOSIS — E162 Hypoglycemia, unspecified: Secondary | ICD-10-CM

## 2019-11-30 DIAGNOSIS — E1165 Type 2 diabetes mellitus with hyperglycemia: Secondary | ICD-10-CM

## 2019-11-30 DIAGNOSIS — R109 Unspecified abdominal pain: Secondary | ICD-10-CM

## 2019-11-30 LAB — GLUCOSE, CAPILLARY
Glucose-Capillary: 117 mg/dL — ABNORMAL HIGH (ref 70–99)
Glucose-Capillary: 110 mg/dL — ABNORMAL HIGH (ref 70–99)
Glucose-Capillary: 114 mg/dL — ABNORMAL HIGH (ref 70–99)
Glucose-Capillary: 124 mg/dL — ABNORMAL HIGH (ref 70–99)

## 2019-11-30 LAB — RENAL FUNCTION PANEL
Albumin: 2.6 g/dL — ABNORMAL LOW (ref 3.5–5.0)
Anion gap: 10 (ref 5–15)
BUN: 88 mg/dL — ABNORMAL HIGH (ref 6–20)
CO2: 22 mmol/L (ref 22–32)
Calcium: 8.8 mg/dL — ABNORMAL LOW (ref 8.9–10.3)
Chloride: 106 mmol/L (ref 98–111)
Creatinine, Ser: 3.83 mg/dL — ABNORMAL HIGH (ref 0.61–1.24)
GFR, Estimated: 19 mL/min — ABNORMAL LOW (ref >60)
Glucose, Bld: 118 mg/dL — ABNORMAL HIGH (ref 70–99)
Phosphorus: 4.1 mg/dL (ref 2.5–4.6)
Potassium: 3.8 mmol/L (ref 3.5–5.1)
Sodium: 138 mmol/L (ref 135–145)

## 2019-11-30 LAB — CBC
HCT: 24.4 % — ABNORMAL LOW (ref 39.0–52.0)
Hemoglobin: 7.3 g/dL — ABNORMAL LOW (ref 13.0–17.0)
MCH: 27.2 pg (ref 26.0–34.0)
MCHC: 29.9 g/dL — ABNORMAL LOW (ref 30.0–36.0)
MCV: 91 fL (ref 80.0–100.0)
Platelets: 356 10*3/uL (ref 150–400)
RBC: 2.68 MIL/uL — ABNORMAL LOW (ref 4.22–5.81)
RDW: 15.9 % — ABNORMAL HIGH (ref 11.5–15.5)
WBC: 9.6 10*3/uL (ref 4.0–10.5)
nRBC: 0 % (ref 0.0–0.2)

## 2019-11-30 MED ORDER — HYDROMORPHONE HCL 1 MG/ML IJ SOLN
0.5000 mg | Freq: Once | INTRAMUSCULAR | Status: AC
  Administered 2019-11-30: 0.5 mg via INTRAVENOUS
  Filled 2019-11-30: qty 1

## 2019-11-30 MED ORDER — AZITHROMYCIN 500 MG PO TABS
500.0000 mg | ORAL_TABLET | Freq: Every day | ORAL | Status: DC
  Administered 2019-11-30 – 2019-12-01 (×2): 500 mg via ORAL
  Filled 2019-11-30 (×2): qty 1

## 2019-11-30 MED ORDER — ONDANSETRON HCL 4 MG/2ML IJ SOLN
4.0000 mg | Freq: Four times a day (QID) | INTRAMUSCULAR | Status: DC | PRN
  Administered 2019-11-30: 4 mg via INTRAVENOUS
  Filled 2019-11-30: qty 2

## 2019-11-30 NOTE — Progress Notes (Addendum)
Subjective: HD2 for [name] who is a 44 year old male with PMHx of HFpEF, type II DM, DVT/PE on Eliquis, PVD s/p R AKA, and CKDIV admitted for hypoglycemic episodes and acute on chronic diastolic heart failure.   Overnight, patient with diffuse abdominal pain that has been present for several days. He was given remeron for sleep and also dilaudid for abdominal pain with improvement.   Victor [name] was evaluated at bedside this morning. States wants to go home today. States he thinks abdominal pain is not acute or chronic but hunger pain as he does not have appetite.  Explained treatment for UTI and shows fair understanding. Had Ct scan last time for constipation. Friday night was last bowel movement, well formed. Discussion about CT abdomen to explore abdominal pain and agrees to the plan.   Objective:  Vital signs in last 24 hours: Vitals:   11/29/19 1843 11/29/19 2056 11/30/19 0439 11/30/19 0841  BP: (!) 149/69 134/73 (!) 169/82 (!) 162/72  Pulse: 88 88 89 86  Resp: 20 19 18 16   Temp: 98.8 F (37.1 C) 98.4 F (36.9 C) 98.6 F (37 C) 98.2 F (36.8 C)  TempSrc: Oral Oral Oral Oral  SpO2: 90% 93% 95% 98%  Weight:  (!) 170.1 kg    Height:       CBC Latest Ref Rng & Units 11/30/2019 11/29/2019 11/28/2019  WBC 4.0 - 10.5 K/uL 9.6 11.3(H) 18.8(H)  Hemoglobin 13.0 - 17.0 g/dL 7.3(L) 7.1(L) 7.6(L)  Hematocrit 39 - 52 % 24.4(L) 23.0(L) 24.2(L)  Platelets 150 - 400 K/uL 356 313 318   BMP Latest Ref Rng & Units 11/30/2019 11/29/2019 11/28/2019  Glucose 70 - 99 mg/dL 118(H) 81 120(H)  BUN 6 - 20 mg/dL 88(H) 91(H) 87(H)  Creatinine 0.61 - 1.24 mg/dL 3.83(H) 4.41(H) 4.31(H)  BUN/Creat Ratio 9 - 20 - - -  Sodium 135 - 145 mmol/L 138 138 137  Potassium 3.5 - 5.1 mmol/L 3.8 3.9 3.8  Chloride 98 - 111 mmol/L 106 106 107  CO2 22 - 32 mmol/L 22 21(L) 19(L)  Calcium 8.9 - 10.3 mg/dL 8.8(L) 8.8(L) 9.0   Physical Exam  Constitutional: Obese male. No distress.  Cardiovascular:  Normal rate, regular rhythm, S1 and S2 present, no murmurs, rubs, gallops.  Distal pulse in left lower extremity and bilateral upper extremities intact  Respiratory: No respiratory distress, no accessory muscle use.  Effort is normal. Bibasilar crackles improved; on room air  GI: Nondistended, soft, + bowel sounds; persistent tenderness to palpation in RLQ and epigastric region  Musculoskeletal: R AKA; left leg with significant lymphedema and 2+ pitting edema to mid tibia  Neurological: Is alert and oriented x4, no apparent focal deficits noted. Skin: Warm and dry.  No rash, erythema, lesions noted.  Assessment/Plan:  Principal Problem:   Hypoglycemia  [name] is 44 yo M w/ PMH of CKD4, HFpEF, T2DM, HTN, DVT/PE on eliquis, HLD, PVD s/p R AKA admitted for hypoglycemia in setting of CAP vs UTI and acute on chronic HFpEF with acute on chronic renal failure.   Hypoglycemia Type II DM Patient's hypoglycemia is improved with holding insulin. However, continues to have poor oral intake and currently not requiring insulin to maintain CBGs <120. He was noted to have CXR consistent with possible pneumonia and urinalysis significant for bacteruria, leukocytes and WBC clumps. He denies dysuria but does note that he is not urinating as frequently as before. His hypoglycemia may be secondary to decreasing insulin requirements in  setting of weight loss vs infection.  - Continue to hold home insulin - CBG checks with SSI as needed - Hypoglycemic protocol - Azithromycin and ceftriaxone day 3 for CAP and UTI  AKI on CKDIV UTI:  Patient noted to have worsening renal function with sCr to 4.41 (baseline 3.14) on admission. Suspect his AKI may be in setting of his acute HF exacerbation vs UTI. Serum creatinine improved to 3.83 this morning.  - Continue IV diuresis with Lasix 80mg  bid  - Ceftriaxone day 3 as above for UTI tx  - Strict I&O  - Avoid nephrotoxic agents as able   Acute on chronic HFpEF:    Patient has been diuresing well with IV Lasix 80mg  bid with ~4.4L UOP over past 24 hours. His bibasilar crackles have improved and his left lower leg edema is also slightly improved on exam.  Patient does endorse increased fluid intake as he has been working out. Will need to adjust his lasix regimen on discharge to match his fluid intake.  - Continue IV diuresis with Lasix 80mg  bid  - Unna boots  - Strict I&O - Daily weights  Abdominal pain:  Patient endorses abdominal pain that has been constant for several weeks to months. He initially attributed this to working out and endorses that it is musculoskeletal and some times deeper in nature and feels like a throbbing pain. He has had two episodes of intense pain over past two nights requiring IV dilaudid with relief. He continues to have decreased appetite. Noted to have RLQ and epigastric abdominal pain on exam. At this time, will evaluate with further imaging. - CT Abdomen/Pelvis wo contrast  - Continue to monitor   Hx of DVT/ PE Patient is on Eliquis for this. On admission, left leg swelling noted on examination. D-dimer elevated for which [place] of left leg obtained. Negative for DVT. - Continue Eliquis 5mg  bid   Diet: Low sodium Fluids: None DVT Prophylaxis: Eliquis  Code status: FULL  Family communication: Patient's significant other updated at bedside   Prior to Admission Living Arrangement: Home  Anticipated Discharge Location: Home w/ [place] vs SNF  Barriers to Discharge: Continued medical management  Dispo: Anticipated discharge in approximately 1-2 day(s).   Harvie Heck, MD Internal Medicine, PGY-2 11/30/19 11:57 AM Pager # [phone] After 5pm on weekdays and 1pm on weekends: On Call pager [phone]

## 2019-11-30 NOTE — Progress Notes (Signed)
PHARMACIST - PHYSICIAN COMMUNICATION DR:   Marva Panda CONCERNING: Antibiotic IV to Oral Route Change Policy  RECOMMENDATION: This patient is receiving azithromycin by the intravenous route.  Based on criteria approved by the Pharmacy and Therapeutics Committee, the antibiotic(s) is/are being converted to the equivalent oral dose form(s).   DESCRIPTION: These criteria include:  Patient being treated for a respiratory tract infection, urinary tract infection, cellulitis or clostridium difficile associated diarrhea if on metronidazole  The patient is not neutropenic and does not exhibit a GI malabsorption state  The patient is eating (either orally or via tube) and/or has been taking other orally administered medications for a least 24 hours  The patient is improving clinically and has a Tmax < 100.5  If you have questions about this conversion, please contact the Pharmacy Department  []   [phone] )  [facility] []   [phone] )  [facility] [x]   [phone] )  [facility] []   [phone] )  [facility] []   [phone] )  [facility]

## 2019-11-30 NOTE — Evaluation (Signed)
Occupational Therapy Evaluation Patient Details Name: [name] MRN: [id number] DOB: May 22, 1975 Today's Date: 11/30/2019    History of Present Illness Pt is a 44 y/o male admitted secondary to hypoglycemic episodes and acute on chronic diastolic heart failure. Pt also with UTI and CAP and started on antibiotics. PMH includes R AKA, DM, PE, and CHF.    Clinical Impression   Pt currently demonstrated ability to complete ADL and w/c transfers and w/c management at modified independent level. Pt reports he has an aide M-F for 8hrs/day to assist him with IADL but is otherwise independent with ADL and mobility. Pt reports he has a prosthetic and is able to ambulate at RW level. He also reports going to the gym 6x/week and works with a Clinical research associate with a PT background. Patient evaluated by Occupational Therapy with no further acute OT needs identified. All education has been completed and the patient has no further questions. See below for any follow-up Occupational Therapy or equipment needs. OT to sign off. Thank you for referral.      Follow Up Recommendations  No OT follow up    Equipment Recommendations  None recommended by OT    Recommendations for Other Services       Precautions / Restrictions Precautions Precautions: Fall Restrictions Weight Bearing Restrictions: No      Mobility Bed Mobility               General bed mobility comments: Sitting EOB upon entry     Transfers Overall transfer level: Modified independent   Transfers: Lateral/Scoot Transfers           General transfer comment: Overall at a mod I level to transfer to/from [facility] using lateral scoot technique. Increased time, however, no physical assist required. Able to manage parts as well.     Balance Overall balance assessment: Needs assistance Sitting-balance support: No upper extremity supported;Feet supported Sitting balance-[name]: Good                                      ADL either performed or assessed with clinical judgement   ADL Overall ADL's : Modified independent                                       General ADL Comments: completing ADL with increased time and effort, demosntrated ability to lateral scoot to w/c and maneuver w/c throughout the room and into the bathroom      Vision         Perception     Praxis      Pertinent Vitals/Pain Pain Assessment: No/denies pain     Hand Dominance Right   Extremity/Trunk Assessment Upper Extremity Assessment Upper Extremity Assessment: Overall WFL for tasks assessed   Lower Extremity Assessment Lower Extremity Assessment: RLE deficits/detail RLE Deficits / Details: R AKA at baseline. pt reports he has prosthetic but left it at home   Cervical / Trunk Assessment Cervical / Trunk Assessment: Normal   Communication Communication Communication: No difficulties   Cognition Arousal/Alertness: Awake/alert Behavior During Therapy: Flat affect Overall Cognitive Status: Within Functional Limits for tasks assessed  General Comments  vss    Exercises     Shoulder Instructions      Home Living Family/patient expects to be discharged to:: Private residence Living Arrangements: Alone Available Help at Discharge: Available PRN/intermittently;Personal care attendant (Aide M-F for 8 hours) Type of Home: Apartment Home Access: Level entry     Home Layout: One level     Bathroom Shower/Tub: Teacher, early years/pre: Standard     Home Equipment: Environmental consultant - 2 wheels;Wheelchair - manual;Tub bench;Grab bars - toilet (prosthetic)          Prior Functioning/Environment Level of Independence: Needs assistance  Gait / Transfers Assistance Needed: Uses prosthetic for ambulation and sometimes uses RW.  ADL's / Homemaking Assistance Needed: Aide assist with housecleaning, and sometimes with dressing              OT Problem List: Decreased activity tolerance      OT Treatment/Interventions:      OT Goals(Current goals can be found in the care plan section) Acute Rehab OT Goals Patient Stated Goal: to go home OT Goal Formulation: With patient Time For Goal Achievement: 12/07/19 Potential to Achieve Goals: Good  OT Frequency:     Barriers to D/C:            Co-evaluation PT/OT/SLP Co-Evaluation/Treatment: Yes Reason for Co-Treatment: To address functional/ADL transfers;For patient/therapist safety PT goals addressed during session: Mobility/safety with mobility;Balance;Proper use of DME OT goals addressed during session: ADL's and self-care      AM-PAC OT "6 Clicks" Daily Activity     Outcome Measure Help from another person eating meals?: None Help from another person taking care of personal grooming?: None Help from another person toileting, which includes using toliet, bedpan, or urinal?: None Help from another person bathing (including washing, rinsing, drying)?: None Help from another person to put on and taking off regular upper body clothing?: None Help from another person to put on and taking off regular lower body clothing?: None 6 Click Score: 24   End of Session Nurse Communication: Mobility status  Activity Tolerance: Patient tolerated treatment well Patient left: in bed;with call bell/phone within reach;with family/visitor present  OT Visit Diagnosis: Other abnormalities of gait and mobility (R26.89)                Time: 8325-4982 OT Time Calculation (min): 16 min Charges:  OT General Charges $OT Visit: 1 Visit OT Evaluation $OT Eval Moderate Complexity: 1 Mod  [name] OTR/L Acute Rehabilitation Services Office: [phone]   [name] 11/30/2019, 2:00 PM

## 2019-11-30 NOTE — Evaluation (Signed)
Physical Therapy Evaluation and Discharge Patient Details Name: [name] MRN: [id number] DOB: Dec 07, 1975 Today's Date: 11/30/2019   History of Present Illness  Pt is a 44 y/o male admitted secondary to hypoglycemic episodes and acute on chronic diastolic heart failure. Pt also with UTI and CAP and started on antibiotics. PMH includes R AKA, DM, PE, and CHF.   Clinical Impression  Patient evaluated by Physical Therapy with no further acute PT needs identified. All education has been completed and the patient has no further questions. Pt overall at a mod I for transfers to [facility] and at an independent level for managing WC within the room. Pt watching the game and requesting to stay in the room. Did not have prosthetic so was unable to practice ambulation. Reports he has an aide M-F for 8 hours each day and has been working with a trainer with a PT background. Discussed importance of using RW with prosthetic as pt likely to have some weakness following hospital admission. See below for any follow-up Physical Therapy or equipment needs. PT is signing off. Thank you for this referral. If needs change, please re-consult.     Follow Up Recommendations No PT follow up (reports he plans to return to the gym)    Equipment Recommendations  None recommended by PT    Recommendations for Other Services       Precautions / Restrictions Precautions Precautions: Fall Restrictions Weight Bearing Restrictions: No      Mobility  Bed Mobility               General bed mobility comments: Sitting EOB upon entry     Transfers Overall transfer level: Modified independent   Transfers: Lateral/Scoot Transfers           General transfer comment: Overall at a mod I level to transfer to/from [facility] using lateral scoot technique. Increased time, however, no physical assist required. Able to manage parts as well.   Ambulation/Gait             General Gait Details: Did not bring prosthetic, so  was unable to attempt ambulation   Hotel manager mobility: Yes Wheelchair propulsion: Both upper extremities;Left lower extremity Wheelchair parts: Independent Distance: 15 Wheelchair Assistance Details (indicate cue type and reason): Overall independent with WC navigation within the room. Able to navigate tight spaces and go in and out of bathroom without assist.   Modified Rankin (Stroke Patients Only)       Balance Overall balance assessment: Needs assistance Sitting-balance support: No upper extremity supported;Feet supported Sitting balance-[name]: Good                                       Pertinent Vitals/Pain Pain Assessment: No/denies pain    Home Living Family/patient expects to be discharged to:: Private residence Living Arrangements: Alone Available Help at Discharge: Available PRN/intermittently;Personal care attendant (Aide M-F for 8 hours) Type of Home: Apartment Home Access: Level entry     Home Layout: One level Home Equipment: Walker - 2 wheels;Wheelchair - manual;Tub bench;Grab bars - toilet (prosthetic)      Prior Function Level of Independence: Needs assistance   Gait / Transfers Assistance Needed: Uses prosthetic for ambulation and sometimes uses RW.   ADL's / Homemaking Assistance Needed: Aide assist with housecleaning, and sometimes with dressing  Hand Dominance        Extremity/Trunk Assessment   Upper Extremity Assessment Upper Extremity Assessment: Defer to OT evaluation    Lower Extremity Assessment Lower Extremity Assessment: RLE deficits/detail RLE Deficits / Details: R AKA at baseline.     Cervical / Trunk Assessment Cervical / Trunk Assessment: Normal  Communication   Communication: No difficulties  Cognition Arousal/Alertness: Awake/alert Behavior During Therapy: Flat affect Overall Cognitive Status: Within Functional Limits for  tasks assessed                                        General Comments      Exercises     Assessment/Plan    PT Assessment Patent does not need any further PT services  PT Problem List         PT Treatment Interventions      PT Goals (Current goals can be found in the Care Plan section)  Acute Rehab PT Goals Patient Stated Goal: to go home PT Goal Formulation: With patient Time For Goal Achievement: 11/30/19 Potential to Achieve Goals: Good    Frequency     Barriers to discharge        Co-evaluation PT/OT/SLP Co-Evaluation/Treatment: Yes Reason for Co-Treatment: To address functional/ADL transfers;Complexity of the patient's impairments (multi-system involvement) PT goals addressed during session: Mobility/safety with mobility;Balance;Proper use of DME         AM-PAC PT "6 Clicks" Mobility  Outcome Measure Help needed turning from your back to your side while in a flat bed without using bedrails?: None Help needed moving from lying on your back to sitting on the side of a flat bed without using bedrails?: None Help needed moving to and from a bed to a chair (including a wheelchair)?: None Help needed standing up from a chair using your arms (e.g., wheelchair or bedside chair)?: Total Help needed to walk in hospital room?: Total Help needed climbing 3-5 steps with a railing? : Total 6 Click Score: 15    End of Session   Activity Tolerance: Patient tolerated treatment well Patient left: in bed;with call bell/phone within reach;with family/visitor present Nurse Communication: Mobility status PT Visit Diagnosis: Other abnormalities of gait and mobility (R26.89)    Time: 4196-2229 PT Time Calculation (min) (ACUTE ONLY): 15 min   Charges:   PT Evaluation $PT Eval Low Complexity: 1 Low          [name], DPT  Acute Rehabilitation Services  Pager: [phone] Office: [phone]   Rudean Hitt 11/30/2019, 1:35  PM

## 2019-11-30 NOTE — Progress Notes (Signed)
   11/30/19 0119  Pain Assessment  Pain Scale 0-10  Pain Score 8  Pain Type Acute pain  Pain Location Abdomen  Pain Orientation Right;Left;Upper;Lower  Pain Descriptors / Indicators Throbbing  Pain Frequency Intermittent  Pain Onset On-going  Patients Stated Pain Goal 0  Pain Intervention(s) MD notified (Comment);Emotional support  Provider Notification  Provider Name/Title Internal Medicine MD  Date Provider Notified 11/30/19  Time Provider Notified 0119  Notification Type Page  Notification Reason Requested by patient/family (Diffuse throbbing abd pain 8/10)  Response See new orders  Date of Provider Response 11/30/19   Patient c/o severe diffuse throbbing abdominal pain.  Rates as 8/10.  Not associated with N/V.  Endorses poor appetite. Nothing looks appealing to him from eat.  His girlfriend brought [employer] and that did not tempt him to eat.  MD made aware.  IV Dilaudid 0.5 mg ordered and given with good relief of pain.  Advised patient not to eat or drink in case MD ordered CT of abdomen.  Patient in agreement.  Will continue to monitor patient.  Earleen Reaper RN

## 2019-12-01 ENCOUNTER — Encounter (HOSPITAL_COMMUNITY): Payer: Medicare Other

## 2019-12-01 ENCOUNTER — Other Ambulatory Visit: Payer: Self-pay | Admitting: Internal Medicine

## 2019-12-01 DIAGNOSIS — E162 Hypoglycemia, unspecified: Secondary | ICD-10-CM | POA: Diagnosis not present

## 2019-12-01 DIAGNOSIS — E1165 Type 2 diabetes mellitus with hyperglycemia: Secondary | ICD-10-CM | POA: Diagnosis not present

## 2019-12-01 DIAGNOSIS — Z794 Long term (current) use of insulin: Secondary | ICD-10-CM | POA: Diagnosis not present

## 2019-12-01 DIAGNOSIS — N179 Acute kidney failure, unspecified: Secondary | ICD-10-CM | POA: Diagnosis not present

## 2019-12-01 LAB — RENAL FUNCTION PANEL
Albumin: 2.5 g/dL — ABNORMAL LOW (ref 3.5–5.0)
Anion gap: 14 (ref 5–15)
BUN: 88 mg/dL — ABNORMAL HIGH (ref 6–20)
CO2: 22 mmol/L (ref 22–32)
Calcium: 8.8 mg/dL — ABNORMAL LOW (ref 8.9–10.3)
Chloride: 101 mmol/L (ref 98–111)
Creatinine, Ser: 3.58 mg/dL — ABNORMAL HIGH (ref 0.61–1.24)
GFR, Estimated: 21 mL/min — ABNORMAL LOW (ref >60)
Glucose, Bld: 115 mg/dL — ABNORMAL HIGH (ref 70–99)
Phosphorus: 4.2 mg/dL (ref 2.5–4.6)
Potassium: 3.7 mmol/L (ref 3.5–5.1)
Sodium: 137 mmol/L (ref 135–145)

## 2019-12-01 LAB — URINE CULTURE: Culture: >=100000 — AB

## 2019-12-01 LAB — GLUCOSE, CAPILLARY
Glucose-Capillary: 18 mg/dL — CL (ref 70–99)
Glucose-Capillary: 67 mg/dL — ABNORMAL LOW (ref 70–99)
Glucose-Capillary: 108 mg/dL — ABNORMAL HIGH (ref 70–99)
Glucose-Capillary: 105 mg/dL — ABNORMAL HIGH (ref 70–99)
Glucose-Capillary: 129 mg/dL — ABNORMAL HIGH (ref 70–99)
Glucose-Capillary: 151 mg/dL — ABNORMAL HIGH (ref 70–99)

## 2019-12-01 LAB — CBC
HCT: 23.8 % — ABNORMAL LOW (ref 39.0–52.0)
Hemoglobin: 7.3 g/dL — ABNORMAL LOW (ref 13.0–17.0)
MCH: 27.8 pg (ref 26.0–34.0)
MCHC: 30.7 g/dL (ref 30.0–36.0)
MCV: 90.5 fL (ref 80.0–100.0)
Platelets: 387 10*3/uL (ref 150–400)
RBC: 2.63 MIL/uL — ABNORMAL LOW (ref 4.22–5.81)
RDW: 16.2 % — ABNORMAL HIGH (ref 11.5–15.5)
WBC: 10.1 10*3/uL (ref 4.0–10.5)
nRBC: 0 % (ref 0.0–0.2)

## 2019-12-01 MED ORDER — CEFDINIR 300 MG PO CAPS: 300.0000 mg | ORAL_CAPSULE | Freq: Two times a day (BID) | ORAL | 0 refills | Status: AC

## 2019-12-01 MED ORDER — CEFDINIR 300 MG PO CAPS: 300.0000 mg | ORAL_CAPSULE | Freq: Two times a day (BID) | ORAL | Status: DC

## 2019-12-01 MED ORDER — VICTOZA 18 MG/3ML ~~LOC~~ SOPN: PEN_INJECTOR | SUBCUTANEOUS | 0 refills | Status: DC

## 2019-12-01 MED ORDER — MAGNESIUM HYDROXIDE 400 MG/5ML PO SUSP
15.0000 mL | Freq: Once | ORAL | Status: AC
  Administered 2019-12-01: 15 mL via ORAL
  Filled 2019-12-01 (×2): qty 30

## 2019-12-01 MED ORDER — TRESIBA FLEXTOUCH 200 UNIT/ML ~~LOC~~ SOPN: PEN_INJECTOR | SUBCUTANEOUS | 1 refills | Status: DC

## 2019-12-01 MED ORDER — SENNOSIDES-DOCUSATE SODIUM 8.6-50 MG PO TABS
2.0000 | ORAL_TABLET | Freq: Two times a day (BID) | ORAL | Status: DC
  Administered 2019-12-01: 2 via ORAL
  Filled 2019-12-01: qty 2

## 2019-12-01 MED ORDER — POLYETHYLENE GLYCOL 3350 17 G PO PACK: 17.0000 g | PACK | Freq: Once | ORAL | Status: DC

## 2019-12-01 MED ORDER — HYDRALAZINE HCL 50 MG PO TABS: 50.0000 mg | ORAL_TABLET | Freq: Three times a day (TID) | ORAL | 0 refills | Status: DC

## 2019-12-01 MED ORDER — SENNOSIDES-DOCUSATE SODIUM 8.6-50 MG PO TABS: 2.0000 | ORAL_TABLET | Freq: Every day | ORAL | Status: DC

## 2019-12-01 MED FILL — Medication: Qty: 1 | Status: AC

## 2019-12-01 NOTE — Progress Notes (Signed)
Orthopedic Tech Progress Note Patient Details:  [name] [birth year] [id number]  Ortho Devices Type of Ortho Device: Haematologist Ortho Device/Splint Location: Left Lower Extremity Ortho Device/Splint Interventions: Ordered, Application   Post Interventions Patient Tolerated: Well Instructions Provided: Care of device, Poper ambulation with device   [name] P [name] 12/01/2019, 1:22 PM

## 2019-12-01 NOTE — Progress Notes (Signed)
DISCHARGE NOTE HOME [name] to be discharged home per MD order. Discussed prescriptions and follow up appointments with the patient. Prescriptions given to patient; medication list explained in detail. Patient verbalized understanding.  Skin clean, dry and intact without evidence of skin break down, no evidence of skin tears noted. IV catheter discontinued intact. Site without signs and symptoms of complications. Dressing and pressure applied. Pt denies pain at the site currently. No complaints noted.  Patient free of lines, drains, and wounds.   An After Visit Summary (AVS) was printed and given to the patient. Patient escorted via wheelchair, and discharged home via private auto.  Taneya Conkel S [place] Dost, RN

## 2019-12-01 NOTE — Progress Notes (Signed)
   Subjective:  Patient evaluated at bedside this AM. Mentions he is doing okay this morning, but continues to have abdominal pain. States he does not feel at baseline yet. He also notes he regularly drinks 2L of water daily. Discussed need for insulin changes upon discharge given his recent weight loss. Also discussed CT results of large stool burden. Mentions he takes stool softener at home.  Objective:  Vital signs in last 24 hours: Vitals:   11/30/19 0841 11/30/19 2104 12/01/19 0633 12/01/19 0945  BP: (!) 162/72 (!) 151/73 134/64 (!) 151/79  Pulse: 86 88 86 86  Resp: 16 18 19 20   Temp: 98.2 F (36.8 C) 98.1 F (36.7 C) 98 F (36.7 C) 98.2 F (36.8 C)  TempSrc: Oral Oral Oral Oral  SpO2: 98% 94% (!) 88% 92%  Weight:  (!) 170.2 kg    Height:       Physical Exam: General: Obese male, no acute distress Abdomen: Mildly distended, tender in LLQ, RLQ, RUQ. Normoactive bowel movements. Neuro: Awake, alert, oriented x4. Moving extremities appropriately.   Assessment/Plan:  Principal Problem:   Hypoglycemia  #Hypoglycemia #Type 2 diabetes mellitus CBG's continue to be appropriate off of home insulin, although poor po intake while in hospital. No urinary symptoms at this time. Given recent CXR consistent with PNA and bacteruria, will continue with antibiotics for 5 day course. Switching from ceftriaxone to cefdinir. Prior to d/c will need to adjust insulin. - Hold home insulin (will adjust prior to d/c) - CBG, SSI - C/w azithromycin, cefdinir (day 4/5)  #Acute on chronic HFpEF Patient continues to have good UOP with IV lasix. Will need to construct good regimen for patient upon discharge given his recent increase in fluid intake. - C/w IV lasix 80mg  BID - Unna boots - Daily weights  #AKI on CKD IV BL sCr 3.14. Improving daily with diuretics, sCr 3.83 > 3.58. Most likely AKI d/t low EABV given heart failure exacerbation. - IV diuresis - Strict I&O's - Avoid nephrotoxic  agents  #Abdominal pain CT abdomen/pelvis yesterday, no acute abnormality. Patient has increased stool burden. Most likely abdominal pain due to constipation. Will adjust bowel regimen. - Bowel regimen: Senokot-S, milk of magnesia  Prior to Admission Living Arrangement: Home Anticipated Discharge Location: Home Barriers to Discharge: medical management Dispo: Anticipated discharge in approximately 1-2 day(s).   Sanjuan Dame, MD 12/01/2019, 12:47 PM Pager: [phone] After 5pm on weekdays and 1pm on weekends: On Call pager [phone]

## 2019-12-02 NOTE — Discharge Summary (Signed)
Name: [name] MRN: [id number] DOB: 1975-02-26 44 y.o. PCP: [name], MD  Date of Admission: 11/28/2019 11:30 AM Date of Discharge: 12/01/2019 Attending Physician: Dr. Jimmye Norman  Discharge Diagnosis: 1. Hypoglycemia 2. Acute on chronic heart failure exacerbation 3. Acute kidney injury 4. Constipation-induced abdominal pain  Discharge Medications: Allergies as of 12/01/2019       Reactions   Reglan [metoclopramide] Other (See Comments)   Dysphoric reaction   Metformin And Related Diarrhea   Ozempic (0.25 Or 0.5 Mg-dose) [semaglutide(0.25 Or 0.44m-dos)] Nausea And Vomiting        Medication List     STOP taking these medications    insulin aspart 100 UNIT/ML FlexPen Commonly known as: NOVOLOG       TAKE these medications    Accu-Chek Guide Me w/Device Kit Use to check blood sugar up to 3 times a day What changed:   how much to take  how to take this  when to take this  additional instructions   Accu-Chek Guide test strip Generic drug: glucose blood Check blood sugar up to 3 times a day What changed:   how much to take  how to take this  when to take this  additional instructions   accu-chek softclix lancets Use to check blood sugars 5 times a day. Dx code:E11.9. Insulin dependent. What changed:   how much to take  how to take this  when to take this   Accu-Chek Softclix Lancets lancets Check blood sugar up to 3 times a day What changed:   how much to take  how to take this  when to take this  additional instructions   acidophilus Caps capsule Take 1 capsule by mouth daily.   allopurinol 100 MG tablet Commonly known as: ZYLOPRIM Take 2 tablets by mouth once daily   Alpha-Lipoic Acid 600 MG Caps Take 600 mg by mouth daily.   amLODipine 10 MG tablet Commonly known as: NORVASC Take 10 mg by mouth at bedtime.   B-12 500 MCG Subl Place 500 mcg under the tongue daily.   calcitRIOL 0.5 MCG capsule Commonly  known as: ROCALTROL Take 2.5 mcg by mouth daily.   carvedilol 25 MG tablet Commonly known as: COREG Take 1 tablet (25 mg total) by mouth 2 (two) times daily with a meal.   cefdinir 300 MG capsule Commonly known as: OMNICEF Take 1 capsule (300 mg total) by mouth every 12 (twelve) hours for 1 day. Notes to patient: 12/02/2019  Follow specific instructions   cloNIDine 0.3 MG tablet Commonly known as: CATAPRES Take 1 tablet (0.3 mg total) by mouth 2 (two) times daily.   Eliquis 5 MG Tabs tablet Generic drug: apixaban Take 1 tablet by mouth twice daily What changed: how much to take   famotidine 20 MG tablet Commonly known as: PEPCID Take 1 tablet (20 mg total) by mouth 2 (two) times daily. What changed:   when to take this  reasons to take this   Fish Oil 1000 MG Caps Take 1,000 mg by mouth at bedtime.   furosemide 80 MG tablet Commonly known as: LASIX Take 1 tablet by mouth twice daily What changed: when to take this   gabapentin 100 MG capsule Commonly known as: NEURONTIN Take 1 capsule (100 mg total) by mouth 3 (three) times daily.   hydrALAZINE 50 MG tablet Commonly known as: APRESOLINE Take 1 tablet (50 mg total) by mouth 3 (three) times daily. What changed: See the new instructions.   lovastatin 20  MG tablet Commonly known as: MEVACOR Take 1 tablet by mouth once daily What changed: when to take this   Melatonin 10 MG Tabs Take 10 mg by mouth at bedtime as needed (sleep).   ondansetron 4 MG tablet Commonly known as: ZOFRAN Take 1 tablet (4 mg total) by mouth every 6 (six) hours as needed for nausea.   oxybutynin 5 MG tablet Commonly known as: DITROPAN Take 5 mg by mouth 3 (three) times daily.   Pen Needles 3/16" 31G X 5 MM Misc Use five times daily What changed:   how much to take  how to take this  when to take this   B-D UF III MINI PEN NEEDLES 31G X 5 MM Misc Generic drug: Insulin Pen Needle USE  4 TIMES DAILY What changed:   how much  to take  how to take this  when to take this  additional instructions   polyethylene glycol 17 g packet Commonly known as: MIRALAX / GLYCOLAX Take 17 g by mouth 2 (two) times daily. What changed:   when to take this  reasons to take this   potassium chloride SA 20 MEQ tablet Commonly known as: KLOR-CON Take 20 mEq by mouth 2 (two) times daily.   senna-docusate 8.6-50 MG tablet Commonly known as: Senokot-S Take 1 tablet by mouth daily. What changed:   when to take this  reasons to take this   sildenafil 50 MG tablet Commonly known as: VIAGRA Take 50 mg by mouth as needed for erectile dysfunction.   tamsulosin 0.4 MG Caps capsule Commonly known as: FLOMAX Take 0.4 mg by mouth at bedtime.   Tyler Aas FlexTouch 200 UNIT/ML FlexTouch Pen Generic drug: insulin degludec INJECT 45 UNITS SUBCUTANEOUSLY ONCE DAILY What changed: additional instructions   Victoza 18 MG/3ML Sopn Generic drug: liraglutide INJECT 1.8 MG INTO THE SKIN ONCE DAILY AT THE SAME TIME. What changed: additional instructions        Disposition and follow-up:   [name] was discharged from [facility] in Stable condition.  At the hospital follow up visit please address:  1. Hypoglycemia: Due to insulin regimen in setting of recent weight loss. Increased Victoza, decreased Tresiba, and d/c'd meal time coverage. Can titrate as needed.  2. Acute on chronic heart failure exacerbation: Patient diuresing well on IV lasix while hospitalized. D/c'd w/ lasix 33m BID. Can adjust as needed at next appointment.  3. Acute kidney injury: 4.41 on admission, improved with fluids. Also UA w/ bacteruria, treated with 5d antibiotics.  4. Constipation-induced abdominal pain: No BM while inpatient. CT negative. Patient reports he feels more comfortable having BM at home.  2.  Labs / imaging needed at time of follow-up: CBC, BMP  3.  Pending labs/ test needing follow-up: n/a  Follow-up  Appointments:  *Patient has appointment with nephrologist 11/24. Will set up appointment with IMTS for appointment on that day as well.   Hospital Course by problem list: 1. Hypoglycemia: Presenting with 2 weeks hx of multiple episodes of hypoglycemia. On arrival, CBG 18, which improved with glucagon and D50. CBG's appropriate during hospitalization without insulin although has poor intake. Believe his hypoglycemic events are due to recent weight loss with decreased insulin need. On discharge, d/c meal time coverage, increased Victoza, and decreased [place] Patient to follow-up with IMTS.   2. Acute on chronic heart failure exacerbation: Patient hypervolemic on examination on arrival. Prior to hospitalization, on furosemide 884mevery other day. Patient given IV lasix 801mID with  appropriate urinary response. Discharged with lasix 20m BID.  3. Acute kidney injury: On arrival, sCr 4.41 with subsequent improvement with IVF. Patient is seen by outpatient nephrology and undergoing work-up for transition to dialysis. Patient to see nephrologist on 11/24.  4. Constipation-induced abdominal pain: Patient reports ongoing abdominal pain. Previous imaging negative. CT abdomen during this hospitalization without acute changes, large stool burden. Patient started on bowel regimen. Mentions he feels he can have BM much easier at home than in hospital.  Discharge Vitals:   BP (!) 146/78   Pulse 86   Temp 98.2 F (36.8 C) (Oral)   Resp 20   Ht 6' 2"  (1.88 m)   Wt (!) 170.2 kg   SpO2 92%   BMI 48.18 kg/m   Pertinent Labs, Studies, and Procedures:  CBC Latest Ref Rng & Units 12/01/2019 11/30/2019 11/29/2019  WBC 4.0 - 10.5 K/uL 10.1 9.6 11.3(H)  Hemoglobin 13.0 - 17.0 g/dL 7.3(L) 7.3(L) 7.1(L)  Hematocrit 39 - 52 % 23.8(L) 24.4(L) 23.0(L)  Platelets 150 - 400 K/uL 387 356 313   BMP Latest Ref Rng & Units 12/01/2019 11/30/2019 11/29/2019  Glucose 70 - 99 mg/dL 115(H) 118(H) 81  BUN 6 - 20 mg/dL  88(H) 88(H) 91(H)  Creatinine 0.61 - 1.24 mg/dL 3.58(H) 3.83(H) 4.41(H)  BUN/Creat Ratio 9 - 20 - - -  Sodium 135 - 145 mmol/L 137 138 138  Potassium 3.5 - 5.1 mmol/L 3.7 3.8 3.9  Chloride 98 - 111 mmol/L 101 106 106  CO2 22 - 32 mmol/L 22 22 21(L)  Calcium 8.9 - 10.3 mg/dL 8.8(L) 8.8(L) 8.8(L)   CXR 11/19: Recurrent versus unresolved from August asymmetric perihilar lung opacity, corresponding to ground-glass pulmonary opacity partially visible on prior CT Abdomen and Pelvis. Differential considerations include recurrent infection, noninfectious pulmonary inflammation, and pulmonary edema which is felt less likely.  CT abdomen/pelvis 11/21: 1. Motion degraded study. 2. Bilateral ground-glass airspace opacities at the lung bases bilaterally. These are relatively similar in appearance and distribution when compared to prior study dated September 07, 2019. Findings remain suspicious for an atypical infectious process. 3. Cholelithiasis without acute inflammation. 4. Large amount of stool throughout the colon. 5. Distended urinary bladder.  Discharge Instructions: Discharge Instructions     (HEART FAILURE PATIENTS) Call MD:  Anytime you have any of the following symptoms: 1) 3 pound weight gain in 24 hours or 5 pounds in 1 week 2) shortness of breath, with or without a dry hacking cough 3) swelling in the hands, feet or stomach 4) if you have to sleep on extra pillows at night in order to breathe.   Complete by: As directed    Call MD for:  difficulty breathing, headache or visual disturbances   Complete by: As directed    Call MD for:  extreme fatigue   Complete by: As directed    Call MD for:  hives   Complete by: As directed    Call MD for:  persistant dizziness or light-headedness   Complete by: As directed    Call MD for:  persistant nausea and vomiting   Complete by: As directed    Call MD for:  severe uncontrolled pain   Complete by: As directed    Call MD for:   temperature >100.4   Complete by: As directed    Diet Carb Modified   Complete by: As directed    Discharge instructions   Complete by: As directed    [name], [place]  admitted to the hospital for low blood sugars and acute heart failure.   Low blood sugars - This was likely due to increased amount of insulin in setting of your recent weight loss. Your chest xray and urine studies also demonstrated possible infection for which you were treated. On discharge, please continue with cefdinir twice daily for one more day.  Please adjust your insulin dosing as follows: Increase Victoza to 1.36m daily; Decrease Tresiba to 45U daily and discontinue your meal time dosing. Please monitor your blood sugars at home and follow up in clinic for insulin adjustments.   2. Acute heart failure: You were noted to have extra fluid and were managed with IV lasix. On discharge, please continue with Lasix 852mtwice daily. Follow up with your kidney doctor and PCP within this week for adjustment of your lasix dosing. You may need to resume metolazone. Please continue with Unna boot on discharge   3. Acute kidney injury: You were noted to have worsening kidney function on admission in setting of your acute heart failure. This was improving during your stay with the Lasix. Please follow up with your nephrologist and PCP to adjust your medications.   4. Abdominal pain - Your CT studies did not show any acute abnormalities causing your abdominal pain. However, did note significant amount of stool burden. On discharge, please adjust your bowel regimen to ensure that you are having 1-2 bowel movements daily.   Thank you for allowing [place] participate in your care!   Increase activity slowly   Complete by: As directed       Signed: BrSanjuan DameMD 12/02/2019, 1:00 PM   Pager: [phone]

## 2019-12-03 ENCOUNTER — Encounter (HOSPITAL_COMMUNITY): Payer: Medicare Other

## 2019-12-03 ENCOUNTER — Ambulatory Visit: Payer: Medicare Other | Admitting: *Deleted

## 2019-12-03 DIAGNOSIS — N183 Chronic kidney disease, stage 3 unspecified: Secondary | ICD-10-CM

## 2019-12-03 DIAGNOSIS — E876 Hypokalemia: Secondary | ICD-10-CM | POA: Diagnosis not present

## 2019-12-03 DIAGNOSIS — Z89611 Acquired absence of right leg above knee: Secondary | ICD-10-CM

## 2019-12-03 DIAGNOSIS — E119 Type 2 diabetes mellitus without complications: Secondary | ICD-10-CM

## 2019-12-03 DIAGNOSIS — I10 Essential (primary) hypertension: Secondary | ICD-10-CM

## 2019-12-03 DIAGNOSIS — I5032 Chronic diastolic (congestive) heart failure: Secondary | ICD-10-CM

## 2019-12-03 DIAGNOSIS — N185 Chronic kidney disease, stage 5: Secondary | ICD-10-CM | POA: Diagnosis not present

## 2019-12-03 DIAGNOSIS — N2581 Secondary hyperparathyroidism of renal origin: Secondary | ICD-10-CM | POA: Diagnosis not present

## 2019-12-03 DIAGNOSIS — I120 Hypertensive chronic kidney disease with stage 5 chronic kidney disease or end stage renal disease: Secondary | ICD-10-CM | POA: Diagnosis not present

## 2019-12-03 DIAGNOSIS — E785 Hyperlipidemia, unspecified: Secondary | ICD-10-CM

## 2019-12-03 DIAGNOSIS — E669 Obesity, unspecified: Secondary | ICD-10-CM | POA: Diagnosis not present

## 2019-12-03 LAB — CULTURE, BLOOD (ROUTINE X 2)
Culture: NO GROWTH
Culture: NO GROWTH

## 2019-12-03 NOTE — Chronic Care Management (AMB) (Signed)
  Chronic Care Management   Note  12/03/2019 Name: [name] MRN: [id number] DOB: 10/10/1975   Successful outreach to patient to compete transition of care call. Patient was hospitalized from 11/19-11/22 for hypoglycemia found during acute clinic visit, acute kidney injury in presence of chronic kidney injury,  acute heart failure and possible lung and or urine infection.  Reviewed hospital discharge instructions, patient validated he completed the course of Omnicef, decreased the frequency of hydralazine to 3 times daily, decreased Tresiba to 45 units daily,increased Victoza to 1.8 mg daily however he states he has been taking meal time insulin but decreased the dose to 10 units. He reports blood sugar variance of 89-172. Reinforced hospital discharge instructions that meal time insulin was discontinued.  Reviewed heart failure action plan, patient states the swelling in his left leg is much better. He verifies he has a working scale and knows to notify his provider for 3 lb weight gain overnight or 5 lbs in one week. He says she saw his nephrologist today and no changes will be made to his medications until the lab results are back.   He says he will call Dr. Dwyane Dee for a follow up appointment to adjust his insulin regimen now that he has lost weight and having frequent lows. Advised patient to tell Dr Ronnie Derby office staff he was recently discharged from the hospital due to extremely low blood sugar in order to prioritize securing an appointment for endocrinology follow up.   Follow up plan: The care management team will reach out to the patient again over the next 7-14 days.   Kelli Churn RN, CCM, [facility] RN Care Manager [phone]

## 2019-12-08 ENCOUNTER — Encounter (HOSPITAL_COMMUNITY): Payer: Medicare Other

## 2019-12-08 ENCOUNTER — Telehealth: Payer: Self-pay | Admitting: Internal Medicine

## 2019-12-08 NOTE — Progress Notes (Signed)
Internal Medicine Clinic Resident  I have personally reviewed this encounter including the documentation in this note and/or discussed this patient with the care management provider. I will address any urgent items identified by the care management provider and will communicate my actions to the patient's PCP. I have reviewed the patient's CCM visit with my supervising attending, Dr Dareen Piano.  [place], DO 12/08/2019

## 2019-12-08 NOTE — Telephone Encounter (Signed)
TOC-HFU APPT [facility] 12/11/2019 @ 10:15AM WITH DR WINTERS.

## 2019-12-09 NOTE — Telephone Encounter (Signed)
Transition Care Management Follow-up Telephone Call  Date of discharge and from where: 12/01/2019 [facility]  How have you been since you were released from the hospital? "I'm doing fine." Denies fatigue, SHOB, H/A, dizziness, fever, pain, N/V, vision changes. States swelling in LLE goes up and down but is decreased from preadmission. CBG's 81-259 AM fasting  Any questions or concerns? No  Items Reviewed:  Did the pt receive and understand the discharge instructions provided? Yes   Medications obtained and verified? Yes Patient completed cefdinir, stopped novolog, and correctly able to state how he takes Victoza, [place], Lasix, and hydralazine.   Other? Patient states Rolena Infante came off yesterday because it was loose.  Any new allergies since your discharge? No   Dietary orders reviewed? Yes, carb modified  Do you have support at home? Yes Patient lives alone but states he receives support from family, trainer, and significant other  Home Care and Equipment/Supplies: Were home health services ordered? no If so, what is the name of the agency? NA  Has the agency set up a time to come to the patient's home? not applicable Were any new equipment or medical supplies ordered?  No What is the name of the medical supply agency? NA Were you able to get the supplies/equipment? not applicable Do you have any questions related to the use of the equipment or supplies? No  Functional Questionnaire: (I = Independent and D = Dependent) ADLs: I  Bathing/Dressing- I  Meal Prep- I  Eating- I  Maintaining continence- I  Transferring/Ambulation- I  Managing Meds- I  Follow up appointments reviewed:   PCP Hospital f/u appt confirmed? Yes  Scheduled to see Dr. Gilford Rile on 12/11/2019 @ 1015.  [facility] f/u appt confirmed? Yes  Scheduled to see Dr. Dwyane Dee next week. Completed visit with Nephrologist on 12/03/2019  Are transportation arrangements needed? No   If their condition  worsens, is the pt aware to call PCP or go to the Emergency Dept.? Yes Was the patient provided with contact information for the PCP's office or ED? Yes. Also, given main hospital number [phone]) for after hours with instructions to ask for [facility] Resident on call.  Was the pt encouraged to call back with questions or concerns? Yes  L. Golden Gilreath, BSN, RN-BC

## 2019-12-09 NOTE — Progress Notes (Signed)
Internal Medicine Clinic Attending  CCM services provided by the care management provider and their documentation were discussed with Dr. Laural Golden. We reviewed the pertinent findings, urgent action items addressed by the resident and non-urgent items to be addressed by the PCP.  I agree with the assessment, diagnosis, and plan of care documented in the CCM and resident's note.  Aldine Contes, MD 12/09/2019

## 2019-12-11 ENCOUNTER — Ambulatory Visit (INDEPENDENT_AMBULATORY_CARE_PROVIDER_SITE_OTHER): Payer: Medicare Other | Admitting: Internal Medicine

## 2019-12-11 ENCOUNTER — Other Ambulatory Visit: Payer: Self-pay

## 2019-12-11 ENCOUNTER — Encounter: Payer: Self-pay | Admitting: Internal Medicine

## 2019-12-11 VITALS — BP 155/77 | HR 94 | Temp 98.0°F | Ht 74.0 in | Wt 350.0 lb

## 2019-12-11 DIAGNOSIS — E11649 Type 2 diabetes mellitus with hypoglycemia without coma: Secondary | ICD-10-CM | POA: Diagnosis not present

## 2019-12-11 DIAGNOSIS — Z794 Long term (current) use of insulin: Secondary | ICD-10-CM | POA: Diagnosis not present

## 2019-12-11 DIAGNOSIS — N179 Acute kidney failure, unspecified: Secondary | ICD-10-CM | POA: Diagnosis not present

## 2019-12-11 DIAGNOSIS — I5032 Chronic diastolic (congestive) heart failure: Secondary | ICD-10-CM | POA: Diagnosis not present

## 2019-12-11 DIAGNOSIS — E119 Type 2 diabetes mellitus without complications: Secondary | ICD-10-CM

## 2019-12-11 DIAGNOSIS — N183 Chronic kidney disease, stage 3 unspecified: Secondary | ICD-10-CM | POA: Diagnosis not present

## 2019-12-11 DIAGNOSIS — E162 Hypoglycemia, unspecified: Secondary | ICD-10-CM

## 2019-12-11 LAB — GLUCOSE, CAPILLARY: Glucose-Capillary: 353 mg/dL — ABNORMAL HIGH (ref 70–99)

## 2019-12-11 MED ORDER — FUROSEMIDE 80 MG PO TABS: 80.0000 mg | ORAL_TABLET | Freq: Two times a day (BID) | ORAL | 0 refills | Status: DC

## 2019-12-11 MED ORDER — CARVEDILOL 25 MG PO TABS: 25.0000 mg | ORAL_TABLET | Freq: Two times a day (BID) | ORAL | 1 refills | Status: DC

## 2019-12-11 NOTE — Progress Notes (Signed)
   CC: Hospital Follow Up, Medication Refill   HPI:  [name] is a 44 y.o. male, with a PMH noted below, who presents to the clinic for a hospital follow up. To see the management of his acute and chronic conditions, please see the A&P note under the encounters tab.   Past Medical History:  Diagnosis Date  . Acute venous embolism and thrombosis of deep vessels of proximal lower extremity ([facility]) 07/19/2011  . Anemia   . Anginal pain ([place])    pt denies  . Anxiety   . Chest pain, neg MI, normal coronaries by cath 02/18/2013   pt denies  . CHF (congestive heart failure) ([place])   . CKD (chronic kidney disease) stage 3, GFR 30-59 ml/min ([facility]) 02/19/2013  . Colon polyps   . Depression   . Diabetic ulcer of right foot ([place])   . DVT (deep venous thrombosis) ([place]) 09/2002   patient reports additional DVTs in '06 & '11 (unconfirmed)  . ED (erectile dysfunction)   . GERD (gastroesophageal reflux disease)   . History of blood transfusion    "related to OR" (10/31/2016)  . Hyperlipidemia 02/19/2013  . Hypertension   . Nausea & vomiting    "constant for the last couple weeks" (10/31/2016)  . Nephrotic syndrome   . Obesity    BMI 44, weight 346 pounds 01/30/14  . OSA (obstructive sleep apnea) 02/28/2018   Mild obstructive sleep apnea with an AHI of 9.8/h but severe during REM sleep with an AHI of 33.8/h.  Oxygen saturations dropped to 86% and there was moderate snoring  . Peripheral edema   . Pneumonia   . Prosthesis adjustment 08/17/2016  . Pulmonary embolism ([place]) 09/2002   treated with 6 months of warfarin  . Pyelonephritis 02/02/2018  . Type I diabetes mellitus ([place]) dx'd 2001  . Urinary retention    Review of Systems:   Review of Systems  Constitutional: Negative for chills, fever, malaise/fatigue and weight loss.  Eyes: Negative for blurred vision.  Cardiovascular: Negative for chest pain.  Gastrointestinal: Negative for abdominal pain, constipation and diarrhea.    Physical  Exam:  Vitals:   12/11/19 0943  BP: (!) 155/77  Pulse: 94  Temp: 98 F (36.7 C)  TempSrc: Oral  SpO2: 99%  Weight: (!) 350 lb (158.8 kg)  Height: 6\' 2"  (1.88 m)   Physical Exam Constitutional:      General: He is not in acute distress.    Appearance: He is obese. He is not ill-appearing, toxic-appearing or diaphoretic.  Cardiovascular:     Rate and Rhythm: Normal rate and regular rhythm.     Pulses: Normal pulses.     Heart sounds: Normal heart sounds. No murmur heard.  No friction rub. No gallop.   Pulmonary:     Effort: Pulmonary effort is normal.     Breath sounds: Normal breath sounds.  Musculoskeletal:     Comments: R AKA, LLE nonpitting edema  Neurological:     Mental Status: He is alert and oriented to person, place, and time.  Psychiatric:        Mood and Affect: Mood normal.        Behavior: Behavior normal.     Assessment & Plan:   See Encounters Tab for problem based charting.  Patient discussed with Dr. Rebeca Alert

## 2019-12-11 NOTE — Patient Instructions (Addendum)
To [name],   It was a pleasure meeting you again today. Today we discussed your refill medications and we will put in for your lasix and carvedilol. Additionally with your blood sugar medications, we will continue your medications until you see your endocrinologist. We will also preform some lab work today.  We will see you back in three months time!  Have a good day,  [name], MD

## 2019-12-12 ENCOUNTER — Telehealth: Payer: Self-pay

## 2019-12-12 ENCOUNTER — Encounter: Payer: Self-pay | Admitting: Internal Medicine

## 2019-12-12 NOTE — Telephone Encounter (Signed)
Pt needs a referral to dentist that does extractions pls contact [phone]

## 2019-12-12 NOTE — Assessment & Plan Note (Signed)
Patient presented to the clinic/ED with complaints of multiple hypoglycemic episodes with his initial CBG found to be 18, Likely in the setting of his significant amount of weight loss this year. During his Hospital stay, his blood sugars were monitored. Upon discharge, his meal time insulin was discontinued, his Victoza was increased to  18 mg/71mL. His Tyler Aas was decreased to 45 units.   Patient states that he has felt his sugars are "everywhere." he has not been consistently but his last reading was 104. Today's random glucose shows 353, and his A1c in the hospital setting was 5.1%. Patient states that he will contact his Endocrinologist to assist in his medication reconciliation.   A/P - Continue Victoza, Tresiba - Follow up with Endocrinologist

## 2019-12-12 NOTE — Telephone Encounter (Signed)
Agree with plan 

## 2019-12-12 NOTE — Telephone Encounter (Signed)
RTC, self-identified VM obtained, message left that he needs to call his insurance and ask his insurance which dentist/endodontist they recommend he uses. SChaplin, RN,BSN

## 2019-12-12 NOTE — Telephone Encounter (Signed)
Pt confirms he has medicaid, he is advised if he is without pain, drainage, fever, swelling to [facility] [place] and call offices to make sure they accept medicaid. Also given info for emergency dental [facility], 24 hr service to [employer] area

## 2019-12-12 NOTE — Assessment & Plan Note (Signed)
Patient presenting for a hospital follow up (11/19-11/22) for an acute on chronic congestive heart failure exacerbation. He was found to be hypervolemic on physical examination and he was given 80 IV lasix twice daily with appropriate urinary response. He was discharged on his home dose of 80 lasix twice daily.   Patient states that he has been doing well since his hospital stay. He is currently fluid restricting to 2L daily. He is out of his Coreg and Lasix at today's visit. He does follow with Dr. Radford Pax with cardiology for his heart failure.   A/P:  - Refill Lasix 80 mg twice daily and Coreg 25 mg twice daily.  - Follow up with Cardiology - Continue fluid restriction.  - Patient refused BMP/CBC as he had blood work done with his nephrologists.

## 2019-12-12 NOTE — Assessment & Plan Note (Signed)
Patient presents from recent hospitalization for an AKI with associated UTI. His Cr was 4.31 from baseline of 3.19. His creatinine improved to 3.58 before discharge.   Patient has already followed up with his nephrologist and a CBC and BMP was performed. Patient states that his labs were stable, and does not wish for labs to be drawn today.   Plan:  - Continue to Follow with Nephrology.

## 2019-12-15 ENCOUNTER — Ambulatory Visit: Payer: Medicare Other | Admitting: *Deleted

## 2019-12-15 DIAGNOSIS — N183 Chronic kidney disease, stage 3 unspecified: Secondary | ICD-10-CM

## 2019-12-15 DIAGNOSIS — E119 Type 2 diabetes mellitus without complications: Secondary | ICD-10-CM

## 2019-12-15 DIAGNOSIS — Z89611 Acquired absence of right leg above knee: Secondary | ICD-10-CM

## 2019-12-15 DIAGNOSIS — I5032 Chronic diastolic (congestive) heart failure: Secondary | ICD-10-CM

## 2019-12-15 DIAGNOSIS — I10 Essential (primary) hypertension: Secondary | ICD-10-CM

## 2019-12-15 NOTE — Patient Instructions (Signed)
Visit Information It was nice speaking with you today.  Patient Care Plan: Diabetes Type 2 (Adult)    Problem Identified: Currently meeting A1C target but having large fluctuations in blood sugars   Priority: High  Onset Date: 12/15/2019    Goal: Glycemic Management Optimized without large fluctuations in blood sugars   Start Date: 12/15/2019  Expected End Date: 04/08/2020  This Visit's Progress: On track  Priority: High  Note:   Objective:  Lab Results  Component Value Date   HGBA1C 5.1 11/28/2019 .   Lab Results  Component Value Date   CREATININE 3.58 (H) 12/01/2019   CREATININE 3.83 (H) 11/30/2019   CREATININE 4.41 (H) 11/29/2019 .   Marland Kitchen No results found for: EGFR Current Barriers:  Marland Kitchen Knowledge Deficits related to basic Diabetes pathophysiology and self care/management . Unable to independently meet pre and post meal CBG targets - patient reports ongoing high and low blood sugars with large variance Case Manager Clinical Goal(s):  Marland Kitchen Over the next 30-60 days, patient will demonstrate improved adherence to prescribed treatment plan for diabetes self care/management as evidenced by:  . daily monitoring and recording of CBG  . adherence to ADA/ carb modified diet . exercise 5-7 days/week- adherence to prescribed medication regimen Interventions:  . Review of patient status, including review of consultants reports, relevant laboratory and other test results, and medications completed. . Provided education to patient about basic DM disease process and negative impact of highly fluctuating CBGs on all body systems . Reviewed self monitored CBGs and frequency and severity of hypoglycemia . Assessed patient's ability to self treat hypoglycemia . Encouraged patient to be more consistent in checking blood sugars . Reviewed medications with patient and discussed importance of good medication taking behavior . Patient encouraged to continue his exercise routine with his personal  trainer . - A1C testing facilitated . - barriers to adherence to treatment plan identified . - dental care encouraged . - mutual A1C goal set or reviewed . - devices to improve adherence to care identified . - self-awareness of signs/symptoms of hypo or hyperglycemia encouraged . Encouraged patient to discuss CGM with Dr Dwyane Dee during 12/16/19 appointment even though patient is resistant to CGM and says he doesn't mind staking his finger . Discussed plans with patient for ongoing care management follow up and provided patient with direct contact information for care management team . Provided patient with written educational materials related to hypo and hyperglycemia and importance of correct treatment . Reviewed scheduled/upcoming provider appointments including: with Dr Dwyane Dee on 12/16/19 30-60 days, patient will:  - Self administers oral medications as prescribed Self administers insulin as prescribed Self administers injectable DM medication Victoza and Tyler Aas as prescribed Attends all scheduled provider appointments Checks blood sugars as prescribed and utilize hyper and hypoglycemia protocol as needed Adheres to prescribed ADA/carb modified Follow Up Plan: The care management team will reach out to the patient again over the next 30-60 days.       The patient verbalized understanding of instructions, educational materials, and care plan provided today and declined offer to receive copy of patient instructions, educational materials, and care plan.   The care management team will reach out to the patient again over the next 30-60 days.  Kelli Churn RN, CCM, [facility] RN Care Manager [phone]

## 2019-12-15 NOTE — Chronic Care Management (AMB) (Signed)
Chronic Care Management   Follow Up Note   12/15/2019 Name: [name] MRN: [id number] DOB: Aug 09, 1975  Referred by: [name], MD Reason for referral : Chronic Care Management (IDDM, HF, HLD, HTN, CKD, s/p R AKA 02/20/14)   [name] is a 44 y.o. year old male who is a primary care patient of [name], MD. The CCM team was consulted for assistance with chronic disease management and care coordination needs.    Review of patient status, including review of consultants reports, relevant laboratory and other test results, and collaboration with appropriate care team members and the patient's provider was performed as part of comprehensive patient evaluation and provision of chronic care management services.    SDOH (Social Determinants of Health) assessments performed: No See Care Plan activities for detailed interventions related to [facility])     Outpatient Encounter Medications as of 12/15/2019  Medication Sig Note  . Accu-Chek Softclix Lancets lancets Check blood sugar up to 3 times a day (Patient taking differently: 1 each by Other route See admin instructions. Check blood sugar up to 4-5 times a day)   . acidophilus (RISAQUAD) CAPS capsule Take 1 capsule by mouth daily.   Marland Kitchen allopurinol (ZYLOPRIM) 100 MG tablet Take 2 tablets by mouth once daily (Patient taking differently: Take 200 mg by mouth daily. )   . Alpha-Lipoic Acid 600 MG CAPS Take 600 mg by mouth daily.   Marland Kitchen amLODipine (NORVASC) 10 MG tablet Take 10 mg by mouth at bedtime.   . Blood Glucose Monitoring Suppl (ACCU-CHEK GUIDE ME) w/Device KIT Use to check blood sugar up to 3 times a day (Patient taking differently: 1 each by Other route See admin instructions. Use to check blood sugar up to 4-5 times a day)   . calcitRIOL (ROCALTROL) 0.5 MCG capsule Take 2.5 mcg by mouth daily.    . carvedilol (COREG) 25 MG tablet Take 1 tablet (25 mg total) by mouth 2 (two) times daily with a meal.   . cloNIDine (CATAPRES) 0.3 MG  tablet Take 1 tablet (0.3 mg total) by mouth 2 (two) times daily.   . Cyanocobalamin (B-12) 500 MCG SUBL Place 500 mcg under the tongue daily.    Marland Kitchen ELIQUIS 5 MG TABS tablet Take 1 tablet by mouth twice daily (Patient taking differently: Take 5 mg by mouth 2 (two) times daily. )   . famotidine (PEPCID) 20 MG tablet Take 1 tablet (20 mg total) by mouth 2 (two) times daily. (Patient taking differently: Take 20 mg by mouth as needed for heartburn or indigestion. ) 11/28/2019: .  . furosemide (LASIX) 80 MG tablet Take 1 tablet (80 mg total) by mouth 2 (two) times daily.   Marland Kitchen gabapentin (NEURONTIN) 100 MG capsule Take 1 capsule (100 mg total) by mouth 3 (three) times daily.   Marland Kitchen glucose blood (ACCU-CHEK GUIDE) test strip Check blood sugar up to 3 times a day (Patient taking differently: 1 each by Other route See admin instructions. Check blood sugar up to 4-5 times a day)   . hydrALAZINE (APRESOLINE) 50 MG tablet Take 1 tablet (50 mg total) by mouth 3 (three) times daily.   . insulin degludec (TRESIBA FLEXTOUCH) 200 UNIT/ML FlexTouch Pen INJECT 45 UNITS SUBCUTANEOUSLY ONCE DAILY   . Insulin Pen Needle (B-D UF III MINI PEN NEEDLES) 31G X 5 MM MISC USE  4 TIMES DAILY (Patient taking differently: 1 each by Other route See admin instructions. USE 4-5 TIMES DAILY)   . Insulin Pen Needle (PEN  NEEDLES 3/16") 31G X 5 MM MISC Use five times daily (Patient taking differently: 1 each by Other route See admin instructions. Use five times daily)   . Lancet Devices (ACCU-CHEK SOFTCLIX) lancets Use to check blood sugars 5 times a day. Dx code:E11.9. Insulin dependent. (Patient taking differently: 1 each by Other route See admin instructions. Use to check blood sugars 5 times a day. Dx code:E11.9. Insulin dependent.)   . lovastatin (MEVACOR) 20 MG tablet Take 1 tablet by mouth once daily (Patient taking differently: Take 20 mg by mouth at bedtime. )   . Melatonin 10 MG TABS Take 10 mg by mouth at bedtime as needed (sleep).    . Omega-3 Fatty Acids (FISH OIL) 1000 MG CAPS Take 1,000 mg by mouth at bedtime.    . ondansetron (ZOFRAN) 4 MG tablet Take 1 tablet (4 mg total) by mouth every 6 (six) hours as needed for nausea.   Marland Kitchen oxybutynin (DITROPAN) 5 MG tablet Take 5 mg by mouth 3 (three) times daily.    . polyethylene glycol (MIRALAX / GLYCOLAX) 17 g packet Take 17 g by mouth 2 (two) times daily. (Patient taking differently: Take 17 g by mouth as needed for mild constipation. )   . potassium chloride SA (KLOR-CON) 20 MEQ tablet Take 20 mEq by mouth 2 (two) times daily.   Marland Kitchen senna-docusate (SENOKOT-S) 8.6-50 MG tablet Take 1 tablet by mouth daily. (Patient taking differently: Take 1 tablet by mouth as needed for mild constipation. )   . sildenafil (VIAGRA) 50 MG tablet Take 50 mg by mouth as needed for erectile dysfunction.   . tamsulosin (FLOMAX) 0.4 MG CAPS capsule Take 0.4 mg by mouth at bedtime.    Marland Kitchen VICTOZA 18 MG/3ML SOPN INJECT 1.8 MG INTO THE SKIN ONCE DAILY AT THE SAME TIME.    No facility-administered encounter medications on file as of 12/15/2019.     Objective:  Wt Readings from Last 3 Encounters:  12/11/19 (!) 350 lb (158.8 kg)  11/30/19 (!) 375 lb 3.6 oz (170.2 kg)  10/29/19 (!) 335 lb (152 kg)   BP Readings from Last 3 Encounters:  12/11/19 (!) 155/77  12/01/19 (!) 146/78  11/28/19 (!) 195/79    Goals Addressed              This Visit's Progress     Patient Stated   .  "Maintain target A1C of <7.0% without large fluctuations in blood sugar" (pt-stated)        Timeframe:  Long-Range Goal Priority:  High Start Date:     12/15/19                        Expected End Date:  04/09/19               - set target A1C   Self administers oral medications as prescribed Self administers insulin as prescribed Self administers injectable DM medication Victoza and Tyler Aas as prescribed Attends all scheduled provider appointments Checks blood sugars as prescribed and utilize hyper and hypoglycemia  protocol as needed Adheres to prescribed ADA/carb modified     Other   .  Monitor and Manage My Blood Sugar-Diabetes Type 2         Patient Care Plan: Diabetes Type 2 (Adult)    Problem Identified: Currently meeting A1C target but having large fluctuations in blood sugars   Priority: High  Onset Date: 12/15/2019    Goal: Glycemic Management Optimized without large  fluctuations in blood sugars   Start Date: 12/15/2019  Expected End Date: 04/08/2020  This Visit's Progress: On track  Priority: High  Note:   Objective:  Lab Results  Component Value Date   HGBA1C 5.1 11/28/2019 .   Lab Results  Component Value Date   CREATININE 3.58 (H) 12/01/2019   CREATININE 3.83 (H) 11/30/2019   CREATININE 4.41 (H) 11/29/2019 .   Marland Kitchen No results found for: EGFR Current Barriers:  Marland Kitchen Knowledge Deficits related to basic Diabetes pathophysiology and self care/management . Unable to independently meet pre and post meal CBG targets - patient reports ongoing high and low blood sugars with large variance Case Manager Clinical Goal(s):  Marland Kitchen Over the next 30-60 days, patient will demonstrate improved adherence to prescribed treatment plan for diabetes self care/management as evidenced by:  . daily monitoring and recording of CBG  . adherence to ADA/ carb modified diet . exercise 5-7 days/week- adherence to prescribed medication regimen Interventions:  . Review of patient status, including review of consultants reports, relevant laboratory and other test results, and medications completed. . Provided education to patient about basic DM disease process and negative impact of highly fluctuating CBGs on all body systems . Reviewed self monitored CBGs and frequency and severity of hypoglycemia . Assessed patient's ability to self treat hypoglycemia . Encouraged patient to be more consistent in checking blood sugars . Reviewed medications with patient and discussed importance of good medication taking behavior .  Patient encouraged to continue his exercise routine with his personal trainer . - A1C testing facilitated . - barriers to adherence to treatment plan identified . - dental care encouraged . - mutual A1C goal set or reviewed . - devices to improve adherence to care identified . - self-awareness of signs/symptoms of hypo or hyperglycemia encouraged . Encouraged patient to discuss CGM with Dr Dwyane Dee during 12/16/19 appointment even though patient is resistant to CGM and says he doesn't mind sticking his finger . Discussed plans with patient for ongoing care management follow up and provided patient with direct contact information for care management team . Provided patient with written educational materials related to hypo and hyperglycemia and importance of correct treatment . Reviewed scheduled/upcoming provider appointments including: with Dr Dwyane Dee on 12/16/19 30-60 days, patient will:  - Follow Up Plan: The care management team will reach out to the patient again over the next 30-60 days.       Plan:   The care management team will reach out to the patient again over the next 30-60 days.    Kelli Churn RN, CCM, [facility] RN Care Manager [phone]

## 2019-12-15 NOTE — Progress Notes (Signed)
Internal Medicine Clinic Resident  I have personally reviewed this encounter including the documentation in this note and/or discussed this patient with the care management provider. I will address any urgent items identified by the care management provider and will communicate my actions to the patient's PCP. I have reviewed the patient's CCM visit with my supervising attending, Dr Narendra.  Matt Kayce Chismar, MD 12/15/2019   

## 2019-12-16 ENCOUNTER — Ambulatory Visit (INDEPENDENT_AMBULATORY_CARE_PROVIDER_SITE_OTHER): Payer: Medicare Other | Admitting: Endocrinology

## 2019-12-16 ENCOUNTER — Other Ambulatory Visit (HOSPITAL_COMMUNITY): Payer: Self-pay

## 2019-12-16 ENCOUNTER — Other Ambulatory Visit: Payer: Self-pay

## 2019-12-16 VITALS — BP 146/82 | HR 82 | Ht 74.0 in

## 2019-12-16 DIAGNOSIS — Z794 Long term (current) use of insulin: Secondary | ICD-10-CM | POA: Diagnosis not present

## 2019-12-16 DIAGNOSIS — E1165 Type 2 diabetes mellitus with hyperglycemia: Secondary | ICD-10-CM

## 2019-12-16 DIAGNOSIS — I10 Essential (primary) hypertension: Secondary | ICD-10-CM | POA: Diagnosis not present

## 2019-12-16 DIAGNOSIS — E119 Type 2 diabetes mellitus without complications: Secondary | ICD-10-CM

## 2019-12-16 LAB — GLUCOSE, POCT (MANUAL RESULT ENTRY): POC Glucose: 159 mg/dl — AB (ref 70–99)

## 2019-12-16 NOTE — Progress Notes (Signed)
Patient ID: [name], male   DOB: 12/20/1975, 44 y.o.   MRN: [id number]          Reason for Appointment: Follow-up for Type 2 Diabetes    History of Present Illness:          Date of diagnosis of type 2 diabetes mellitus:  2001      His weight is about 400 pounds at the time of diagnosis He does not know what his initial blood sugars and circumstances were but he apparently was started on insulin initially He was not able to tolerate metformin because of diarrhea Subsequently has been on insulin only, level of control not available but his A1c was significantly high at 12.8 in 2015  Background history:    Recent history:   INSULIN regimen is:   48 units Tresiba once daily   NovoLog 5 breakfast 15-20  units before lunch and dinner   Non-insulin hypoglycemic drugs the patient is taking are:  Victoza: 1.8 mg daily   Current management, blood sugar patterns and problems identified:  He has not come back for follow-up since 07/2019  His A1c has progressively decreased and now only 5.1 when hospitalized in 11/21   He did not bring his monitor for download and the other meter he brought has only readings for couple of days  He says that he has been much more strict with his diet with cutting back on portions and carbohydrates  Also sometimes exercising up to 6 days a week and occasionally in the afternoon also  With this he appears to have lost 10 pounds  His Victoza was restarted on the last visit  He had hypoglycemia on 11/19 and was admitted for 3 days; records of his hospitalization were reviewed  During hospitalization his NovoLog was stopped and he was told to reduce his Tresiba to 44, previously on 90 units  However since he has been home he says that his fasting blood sugars have been fairly consistently high  Blood sugars are reportedly fairly good at lunch and after dinner but not clear how often he is monitoring  No hypoglycemia reported recently   Side  effects from medications have been: Diarrhea with metformin, vomiting with Ozempic  Glucose monitoring:  done at least 1 a day         Glucometer:   ACCU-CHEK [employer]iva  Blood sugars by recall:  PRE-MEAL Fasting Lunch Dinner Bedtime Overall  Glucose range: 190-260 90-154     Mean/median:        POST-MEAL PC Breakfast PC Lunch PC Dinner  Glucose range:    119-174  Mean/median:         Self-care:    Typical meal intake: Breakfast is egg whites, some oatmeal/grits and banana.  Usually small portions of meat or other protein at lunch and dinner and only a small amount of sweet potato at lunch or carbohydrate              Dietician visit, most recent: Unknown               Weight history:  Wt Readings from Last 3 Encounters:  12/11/19 (!) 350 lb (158.8 kg)  11/30/19 (!) 375 lb 3.6 oz (170.2 kg)  10/29/19 (!) 335 lb (152 kg)    Glycemic control:   Lab Results  Component Value Date   HGBA1C 5.1 11/28/2019   HGBA1C 5.9 (H) 08/30/2019   HGBA1C 6.5 (H) 07/03/2019   Lab Results  Component Value Date  MICROALBUR 160.61 (H) 08/14/2013   LDLCALC 64 11/14/2018   CREATININE 3.58 (H) 12/01/2019   Lab Results  Component Value Date   MICRALBCREAT 1,528.4 (H) 02/15/2017    Lab Results  Component Value Date   FRUCTOSAMINE 263 12/04/2017      Allergies as of 12/16/2019      Reactions   Reglan [metoclopramide] Other (See Comments)   Dysphoric reaction   Metformin And Related Diarrhea   Ozempic (0.25 Or 0.5 Mg-dose) [semaglutide(0.25 Or 0.65m-dos)] Nausea And Vomiting      Medication List       Accurate as of December 16, 2019  1:29 PM. If you have any questions, ask your nurse or doctor.        Accu-Chek Guide Me w/Device Kit Use to check blood sugar up to 3 times a day What changed:   how much to take  how to take this  when to take this  additional instructions   Accu-Chek Guide test strip Generic drug: glucose blood Check blood sugar up to 3 times a  day What changed:   how much to take  how to take this  when to take this  additional instructions   accu-chek softclix lancets Use to check blood sugars 5 times a day. Dx code:E11.9. Insulin dependent. What changed:   how much to take  how to take this  when to take this   Accu-Chek Softclix Lancets lancets Check blood sugar up to 3 times a day What changed:   how much to take  how to take this  when to take this  additional instructions   acidophilus Caps capsule Take 1 capsule by mouth daily.   allopurinol 100 MG tablet Commonly known as: ZYLOPRIM Take 2 tablets by mouth once daily   Alpha-Lipoic Acid 600 MG Caps Take 600 mg by mouth daily.   amLODipine 10 MG tablet Commonly known as: NORVASC Take 10 mg by mouth at bedtime.   B-12 500 MCG Subl Place 500 mcg under the tongue daily.   calcitRIOL 0.5 MCG capsule Commonly known as: ROCALTROL Take 2.5 mcg by mouth daily.   carvedilol 25 MG tablet Commonly known as: COREG Take 1 tablet (25 mg total) by mouth 2 (two) times daily with a meal.   cloNIDine 0.3 MG tablet Commonly known as: CATAPRES Take 1 tablet (0.3 mg total) by mouth 2 (two) times daily.   Eliquis 5 MG Tabs tablet Generic drug: apixaban Take 1 tablet by mouth twice daily What changed: how much to take   famotidine 20 MG tablet Commonly known as: PEPCID Take 1 tablet (20 mg total) by mouth 2 (two) times daily. What changed:   when to take this  reasons to take this   Fish Oil 1000 MG Caps Take 1,000 mg by mouth at bedtime.   furosemide 80 MG tablet Commonly known as: LASIX Take 1 tablet (80 mg total) by mouth 2 (two) times daily.   gabapentin 100 MG capsule Commonly known as: NEURONTIN Take 1 capsule (100 mg total) by mouth 3 (three) times daily.   hydrALAZINE 50 MG tablet Commonly known as: APRESOLINE Take 1 tablet (50 mg total) by mouth 3 (three) times daily.   lovastatin 20 MG tablet Commonly known as:  MEVACOR Take 1 tablet by mouth once daily What changed: when to take this   Melatonin 10 MG Tabs Take 10 mg by mouth at bedtime as needed (sleep).   ondansetron 4 MG tablet Commonly known as: ZOFRAN Take  1 tablet (4 mg total) by mouth every 6 (six) hours as needed for nausea.   oxybutynin 5 MG tablet Commonly known as: DITROPAN Take 5 mg by mouth 3 (three) times daily.   Pen Needles 3/16" 31G X 5 MM Misc Use five times daily What changed:   how much to take  how to take this  when to take this   B-D UF III MINI PEN NEEDLES 31G X 5 MM Misc Generic drug: Insulin Pen Needle USE  4 TIMES DAILY What changed:   how much to take  how to take this  when to take this  additional instructions   polyethylene glycol 17 g packet Commonly known as: MIRALAX / GLYCOLAX Take 17 g by mouth 2 (two) times daily. What changed:   when to take this  reasons to take this   potassium chloride SA 20 MEQ tablet Commonly known as: KLOR-CON Take 20 mEq by mouth 2 (two) times daily.   senna-docusate 8.6-50 MG tablet Commonly known as: Senokot-S Take 1 tablet by mouth daily. What changed:   when to take this  reasons to take this   sildenafil 50 MG tablet Commonly known as: VIAGRA Take 50 mg by mouth as needed for erectile dysfunction.   tamsulosin 0.4 MG Caps capsule Commonly known as: FLOMAX Take 0.4 mg by mouth at bedtime.   Tyler Aas FlexTouch 200 UNIT/ML FlexTouch Pen Generic drug: insulin degludec INJECT 45 UNITS SUBCUTANEOUSLY ONCE DAILY   Victoza 18 MG/3ML Sopn Generic drug: liraglutide INJECT 1.8 MG INTO THE SKIN ONCE DAILY AT THE SAME TIME.       Allergies:  Allergies  Allergen Reactions  . Reglan [Metoclopramide] Other (See Comments)    Dysphoric reaction  . Metformin And Related Diarrhea  . Ozempic (0.25 Or 0.5 Mg-Dose) [Semaglutide(0.25 Or 0.73m-Dos)] Nausea And Vomiting    Past Medical History:  Diagnosis Date  . Acute venous embolism and  thrombosis of deep vessels of proximal lower extremity ([facility] 07/19/2011  . Anemia   . Anginal pain ([facility]    pt denies  . Anxiety   . Chest pain, neg MI, normal coronaries by cath 02/18/2013   pt denies  . CHF (congestive heart failure) ([facility]   . CKD (chronic kidney disease) stage 3, GFR 30-59 ml/min (HCC) 02/19/2013  . Colon polyps   . Depression   . Diabetic ulcer of right foot ([facility]   . DVT (deep venous thrombosis) ([facility] 09/2002   patient reports additional DVTs in '06 & '11 (unconfirmed)  . ED (erectile dysfunction)   . GERD (gastroesophageal reflux disease)   . History of blood transfusion    "related to OR" (10/31/2016)  . Hyperlipidemia 02/19/2013  . Hypertension   . Nausea & vomiting    "constant for the last couple weeks" (10/31/2016)  . Nephrotic syndrome   . Obesity    BMI 44, weight 346 pounds 01/30/14  . OSA (obstructive sleep apnea) 02/28/2018   Mild obstructive sleep apnea with an AHI of 9.8/h but severe during REM sleep with an AHI of 33.8/h.  Oxygen saturations dropped to 86% and there was moderate snoring  . Peripheral edema   . Pneumonia   . Prosthesis adjustment 08/17/2016  . Pulmonary embolism ([facility] 09/2002   treated with 6 months of warfarin  . Pyelonephritis 02/02/2018  . Type I diabetes mellitus ([facility] dx'd 2001  . Urinary retention     Past Surgical History:  Procedure Laterality Date  . AMPUTATION Right 12/22/2013  Procedure: AMPUTATION BELOW KNEE;  Surgeon: [name], MD;  Location: [facility];  Service: Orthopedics;  Laterality: Right;  . AMPUTATION Right 02/19/2014   Procedure: RIGHT ABOVE KNEE AMPUTATION ;  Surgeon: [name], MD;  Location: [facility];  Service: Orthopedics;  Laterality: Right;  . blood clot removal  2016  . CARDIAC CATHETERIZATION  02/18/2013   normal coronaries  . ESOPHAGOGASTRODUODENOSCOPY N/A 11/02/2016   Procedure: ESOPHAGOGASTRODUODENOSCOPY (EGD);  Surgeon: [name], MD;  Location: [facility];  Service: Endoscopy;  Laterality:  N/A;  . I & D EXTREMITY Right 12/14/2013   Procedure: IRRIGATION AND DEBRIDEMENT RIGHT FOOT;  Surgeon: [name], MD;  Location: [facility];  Service: Orthopedics;  Laterality: Right;  . INCISION AND DRAINAGE ABSCESS  2007; 2015   "back"  . INCISION AND DRAINAGE OF WOUND Right 12/22/2013   Procedure: I&D RIGHT BUTTOCK;  Surgeon: [name], MD;  Location: [facility] [facility];  Service: Orthopedics;  Laterality: Right;  . LEFT HEART CATHETERIZATION WITH CORONARY ANGIOGRAM N/A 02/18/2013   Procedure: LEFT HEART CATHETERIZATION WITH CORONARY ANGIOGRAM;  Surgeon: [name], MD;  Location: [facility] CATH LAB;  Service: Cardiovascular;  Laterality: N/A;    Family History  Problem Relation Age of Onset  . Heart disease Mother   . Diabetes Mother   . Diabetes Maternal Uncle   . Diabetes Cousin     Social History:  reports that he has never smoked. He has never used smokeless tobacco. He reports current alcohol use. He reports that he does not use drugs.   Review of Systems    Lipid history: Has been on lovastatin 20 mg with labs as follows: Prescribed by PCP   Lab Results  Component Value Date   CHOL 122 11/14/2018   HDL 34 (L) 11/14/2018   LDLCALC 64 11/14/2018   TRIG 135 11/14/2018   CHOLHDL 3.6 11/14/2018           Hypertension: He is taking clonidine, Norvasc, hydralazine and Coreg followed by nephrologist  BP Readings from Last 3 Encounters:  12/16/19 (!) 146/82  12/11/19 (!) 155/77  12/01/19 (!) 146/78     Most recent eye exam was in 56/3149   Complications of diabetes: Nephropathy with renal insufficiency and proteinuria, unknown status of retinopathy, erectile dysfunction  Followed by nephrologist for CKD Also has anemia, due to get infusion of iron and also apparently on ESA  Lab Results  Component Value Date   CREATININE 3.58 (H) 12/01/2019   CREATININE 3.83 (H) 11/30/2019   CREATININE 4.41 (H) 11/29/2019    Lab Results  Component Value Date   HGB 7.3 (L) 12/01/2019       LABS:  Office Visit on 12/16/2019  Component Date Value Ref Range Status  . POC Glucose 12/16/2019 159* 70 - 99 mg/dl Final  Office Visit on 12/11/2019  Component Date Value Ref Range Status  . Glucose-Capillary 12/11/2019 353* 70 - 99 mg/dL Final   Glucose reference range applies only to samples taken after fasting for at least 8 hours.  . Comment 1 12/11/2019 Call MD NNP PA CNM   Final    Physical Examination:  BP (!) 146/82   Pulse 82   Ht 6' 2"  (1.88 m)   SpO2 99%   BMI 44.94 kg/m       ASSESSMENT:  Diabetes type 2, with morbid obesity  See history of present illness for detailed discussion of current diabetes management, blood sugar patterns and problems identified  His last A1c is  recently 5.1, previously 6.5  His A1c may be falsely low because of severe anemia  Since his last visit in July he has lost some weight and diet is improved Although he is requiring less insulin compared to his last visit recently reports higher fasting readings Again did not bring his monitor for download and not clear how accurate his recall is and how often he is checking As before his blood sugars are better in the morning after his exercise  Probably also benefiting from restarting Victoza  ANEMIA: To follow-up with his nephrologist  Hypertension: Fair control on multiple medications as before  PLAN:   He will continue 1.8 mg Victoza Increase Tresiba from 48 up to 60 units He will need to start using a flowsheet that was provided to help adjust his Tyler Aas based on fasting readings every 3 days He will increase the dose by 4 units at a time to get morning sugars below 130 Since he is eating a low carbohydrate diet at lunch he is likely going to require only 10 to 15 units for this meal but continue 15-20 at dinner May not need any NovoLog in the morning when exercising especially when fasting readings are back to normal Emphasized the need to check sugars regularly and  bring monitor for download Discussed use of freestyle libre monitor on the next visit Continue balanced meals with low carbohydrate intake  Follow-up in 6 weeks   Patient Instructions  Tresiba 60 units adjust 4 units   Lunch dose 10-15 units      [name] 12/16/2019, 1:29 PM   Note: This office note was prepared with Dragon voice recognition system technology. Any transcriptional errors that result from this process are unintentional.

## 2019-12-16 NOTE — Patient Instructions (Addendum)
Tresiba 60 units adjust 4 units   Lunch dose 10-15 units

## 2019-12-17 ENCOUNTER — Encounter (HOSPITAL_COMMUNITY): Payer: Self-pay

## 2019-12-17 ENCOUNTER — Encounter (HOSPITAL_COMMUNITY): Payer: Medicare Other

## 2019-12-18 NOTE — Progress Notes (Signed)
Internal Medicine Clinic Attending  CCM services provided by the care management provider and their documentation were discussed with Dr. Johnson. We reviewed the pertinent findings, urgent action items addressed by the resident and non-urgent items to be addressed by the PCP.  I agree with the assessment, diagnosis, and plan of care documented in the CCM and resident's note.  Dylon Correa, MD 12/18/2019  

## 2019-12-23 ENCOUNTER — Telehealth: Payer: Medicare Other

## 2019-12-23 NOTE — Progress Notes (Signed)
Internal Medicine Clinic Attending  Case discussed with Dr. Winters at the time of the visit.  We reviewed the resident's history and exam and pertinent patient test results.  I agree with the assessment, diagnosis, and plan of care documented in the resident's note.  Lacosta Hargan, M.D., Ph.D.  

## 2019-12-25 ENCOUNTER — Other Ambulatory Visit (HOSPITAL_COMMUNITY): Payer: Self-pay

## 2019-12-26 ENCOUNTER — Other Ambulatory Visit: Payer: Self-pay

## 2019-12-26 ENCOUNTER — Telehealth: Payer: Medicare Other

## 2019-12-26 ENCOUNTER — Ambulatory Visit (HOSPITAL_COMMUNITY)
Admission: RE | Admit: 2019-12-26 | Discharge: 2019-12-26 | Disposition: A | Discharge status: Home / Self Care | Payer: Medicare Other | Source: Ambulatory Visit | Attending: Nephrology | Admitting: Nephrology

## 2019-12-26 ENCOUNTER — Telehealth: Payer: Self-pay | Admitting: *Deleted

## 2019-12-26 VITALS — BP 134/88 | HR 82 | Temp 97.5°F | Resp 18

## 2019-12-26 DIAGNOSIS — N183 Chronic kidney disease, stage 3 unspecified: Secondary | ICD-10-CM | POA: Insufficient documentation

## 2019-12-26 DIAGNOSIS — D631 Anemia in chronic kidney disease: Secondary | ICD-10-CM | POA: Diagnosis not present

## 2019-12-26 LAB — RENAL FUNCTION PANEL
Albumin: 3.2 g/dL — ABNORMAL LOW (ref 3.5–5.0)
Anion gap: 10 (ref 5–15)
BUN: 47 mg/dL — ABNORMAL HIGH (ref 6–20)
CO2: 23 mmol/L (ref 22–32)
Calcium: 9.2 mg/dL (ref 8.9–10.3)
Chloride: 102 mmol/L (ref 98–111)
Creatinine, Ser: 2.72 mg/dL — ABNORMAL HIGH (ref 0.61–1.24)
GFR, Estimated: 29 mL/min — ABNORMAL LOW (ref >60)
Glucose, Bld: 98 mg/dL (ref 70–99)
Phosphorus: 3.4 mg/dL (ref 2.5–4.6)
Potassium: 3.9 mmol/L (ref 3.5–5.1)
Sodium: 135 mmol/L (ref 135–145)

## 2019-12-26 MED ORDER — DARBEPOETIN ALFA 200 MCG/0.4ML IJ SOSY
200.0000 ug | PREFILLED_SYRINGE | INTRAMUSCULAR | Status: DC
  Administered 2019-12-26: 200 ug via SUBCUTANEOUS

## 2019-12-26 MED ORDER — DARBEPOETIN ALFA 200 MCG/0.4ML IJ SOSY
PREFILLED_SYRINGE | INTRAMUSCULAR | Status: AC
  Filled 2019-12-26: qty 0.4

## 2019-12-26 MED ORDER — FERUMOXYTOL INJECTION 510 MG/17 ML
510.0000 mg | INTRAVENOUS | Status: DC
  Administered 2019-12-26: 510 mg via INTRAVENOUS
  Filled 2019-12-26: qty 510

## 2019-12-26 NOTE — Telephone Encounter (Signed)
  Chronic Care Management   Outreach Note  12/26/2019 Name: [name] MRN: [id number] DOB: 03/01/1975  Referred by: [name], MD Reason for referral : Chronic Care Management (IDDM, HF, HLD, HTN, CKD, s/p R AKA 02/20/14)   [name] is enrolled in a Managed Medicaid Health Plan: No  An unsuccessful telephone outreach was attempted today. The patient was referred to the case management team for assistance with care management and care coordination.   Follow Up Plan: A HIPAA compliant phone message was left for the patient providing contact information and requesting a return call.  The care management team will reach out to the patient again over the next 7-14 days.   Kelli Churn RN, CCM, [facility] RN Care Manager [phone]

## 2019-12-27 LAB — PTH, INTACT AND CALCIUM
Calcium, Total (PTH): 9.3 mg/dL (ref 8.7–10.2)
PTH: 25 pg/mL (ref 15–65)

## 2019-12-29 LAB — POCT HEMOGLOBIN-HEMACUE: Hemoglobin: 9.2 g/dL — ABNORMAL LOW (ref 13.0–17.0)

## 2019-12-31 ENCOUNTER — Telehealth: Payer: Medicare Other

## 2019-12-31 ENCOUNTER — Encounter (HOSPITAL_COMMUNITY): Payer: Medicare Other

## 2020-01-01 ENCOUNTER — Other Ambulatory Visit: Payer: Self-pay

## 2020-01-01 ENCOUNTER — Ambulatory Visit: Payer: Medicare Other | Admitting: *Deleted

## 2020-01-01 ENCOUNTER — Encounter (HOSPITAL_COMMUNITY)
Admission: RE | Admit: 2020-01-01 | Discharge: 2020-01-01 | Disposition: A | Discharge status: Home / Self Care | Payer: Medicare Other | Source: Ambulatory Visit | Attending: Nephrology | Admitting: Nephrology

## 2020-01-01 DIAGNOSIS — I10 Essential (primary) hypertension: Secondary | ICD-10-CM

## 2020-01-01 DIAGNOSIS — E119 Type 2 diabetes mellitus without complications: Secondary | ICD-10-CM

## 2020-01-01 DIAGNOSIS — D631 Anemia in chronic kidney disease: Secondary | ICD-10-CM | POA: Insufficient documentation

## 2020-01-01 DIAGNOSIS — N183 Chronic kidney disease, stage 3 unspecified: Secondary | ICD-10-CM

## 2020-01-01 DIAGNOSIS — N185 Chronic kidney disease, stage 5: Secondary | ICD-10-CM | POA: Diagnosis not present

## 2020-01-01 DIAGNOSIS — I5032 Chronic diastolic (congestive) heart failure: Secondary | ICD-10-CM

## 2020-01-01 MED ORDER — SODIUM CHLORIDE 0.9 % IV SOLN
510.0000 mg | INTRAVENOUS | Status: AC
  Administered 2020-01-01: 510 mg via INTRAVENOUS
  Filled 2020-01-01: qty 510

## 2020-01-01 NOTE — Chronic Care Management (AMB) (Signed)
.ccm  Chronic Care Management   Follow Up Note   01/01/2020 Name: [name] MRN: [id number] DOB: 29-Jul-1975  Referred by: [name], MD Reason for referral : Chronic Care Management (IDDM, HF, HLD, HTN, CKD, s/p R AKA 02/20/14)   [name] is a 44 y.o. year old male who is a primary care patient of [name], MD. The CCM team was consulted for assistance with chronic disease management and care coordination needs.    Review of patient status, including review of consultants reports, relevant laboratory and other test results, and collaboration with appropriate care team members and the patient's provider was performed as part of comprehensive patient evaluation and provision of chronic care management services.    SDOH (Social Determinants of Health) assessments performed: No See Care Plan activities for detailed interventions related to [facility])     Outpatient Encounter Medications as of 01/01/2020  Medication Sig Note  . Accu-Chek Softclix Lancets lancets Check blood sugar up to 3 times a day (Patient taking differently: 1 each by Other route See admin instructions. Check blood sugar up to 4-5 times a day)   . acidophilus (RISAQUAD) CAPS capsule Take 1 capsule by mouth daily.   Marland Kitchen allopurinol (ZYLOPRIM) 100 MG tablet Take 2 tablets by mouth once daily (Patient taking differently: Take 200 mg by mouth daily. )   . Alpha-Lipoic Acid 600 MG CAPS Take 600 mg by mouth daily.   Marland Kitchen amLODipine (NORVASC) 10 MG tablet Take 10 mg by mouth at bedtime.   . Blood Glucose Monitoring Suppl (ACCU-CHEK GUIDE ME) w/Device KIT Use to check blood sugar up to 3 times a day (Patient taking differently: 1 each by Other route See admin instructions. Use to check blood sugar up to 4-5 times a day)   . calcitRIOL (ROCALTROL) 0.5 MCG capsule Take 2.5 mcg by mouth daily.    . carvedilol (COREG) 25 MG tablet Take 1 tablet (25 mg total) by mouth 2 (two) times daily with a meal.   . cloNIDine  (CATAPRES) 0.3 MG tablet Take 1 tablet (0.3 mg total) by mouth 2 (two) times daily.   . Cyanocobalamin (B-12) 500 MCG SUBL Place 500 mcg under the tongue daily.    Marland Kitchen ELIQUIS 5 MG TABS tablet Take 1 tablet by mouth twice daily (Patient taking differently: Take 5 mg by mouth 2 (two) times daily. )   . famotidine (PEPCID) 20 MG tablet Take 1 tablet (20 mg total) by mouth 2 (two) times daily. (Patient taking differently: Take 20 mg by mouth as needed for heartburn or indigestion. ) 11/28/2019: .  . furosemide (LASIX) 80 MG tablet Take 1 tablet (80 mg total) by mouth 2 (two) times daily.   Marland Kitchen gabapentin (NEURONTIN) 100 MG capsule Take 1 capsule (100 mg total) by mouth 3 (three) times daily.   Marland Kitchen glucose blood (ACCU-CHEK GUIDE) test strip Check blood sugar up to 3 times a day (Patient taking differently: 1 each by Other route See admin instructions. Check blood sugar up to 4-5 times a day)   . hydrALAZINE (APRESOLINE) 50 MG tablet Take 1 tablet (50 mg total) by mouth 3 (three) times daily.   . insulin degludec (TRESIBA FLEXTOUCH) 200 UNIT/ML FlexTouch Pen INJECT 45 UNITS SUBCUTANEOUSLY ONCE DAILY   . Insulin Pen Needle (B-D UF III MINI PEN NEEDLES) 31G X 5 MM MISC USE  4 TIMES DAILY (Patient taking differently: 1 each by Other route See admin instructions. USE 4-5 TIMES DAILY)   . Insulin Pen  Needle (PEN NEEDLES 3/16") 31G X 5 MM MISC Use five times daily (Patient taking differently: 1 each by Other route See admin instructions. Use five times daily)   . Lancet Devices (ACCU-CHEK SOFTCLIX) lancets Use to check blood sugars 5 times a day. Dx code:E11.9. Insulin dependent. (Patient taking differently: 1 each by Other route See admin instructions. Use to check blood sugars 5 times a day. Dx code:E11.9. Insulin dependent.)   . lovastatin (MEVACOR) 20 MG tablet Take 1 tablet by mouth once daily (Patient taking differently: Take 20 mg by mouth at bedtime. )   . Melatonin 10 MG TABS Take 10 mg by mouth at bedtime as  needed (sleep).   . Omega-3 Fatty Acids (FISH OIL) 1000 MG CAPS Take 1,000 mg by mouth at bedtime.    . ondansetron (ZOFRAN) 4 MG tablet Take 1 tablet (4 mg total) by mouth every 6 (six) hours as needed for nausea.   Marland Kitchen oxybutynin (DITROPAN) 5 MG tablet Take 5 mg by mouth 3 (three) times daily.    . polyethylene glycol (MIRALAX / GLYCOLAX) 17 g packet Take 17 g by mouth 2 (two) times daily. (Patient taking differently: Take 17 g by mouth as needed for mild constipation. )   . potassium chloride SA (KLOR-CON) 20 MEQ tablet Take 20 mEq by mouth 2 (two) times daily.   Marland Kitchen senna-docusate (SENOKOT-S) 8.6-50 MG tablet Take 1 tablet by mouth daily. (Patient taking differently: Take 1 tablet by mouth as needed for mild constipation. )   . sildenafil (VIAGRA) 50 MG tablet Take 50 mg by mouth as needed for erectile dysfunction.   . tamsulosin (FLOMAX) 0.4 MG CAPS capsule Take 0.4 mg by mouth at bedtime.    Marland Kitchen VICTOZA 18 MG/3ML SOPN INJECT 1.8 MG INTO THE SKIN ONCE DAILY AT THE SAME TIME.    No facility-administered encounter medications on file as of 01/01/2020.     Objective:  Wt Readings from Last 3 Encounters:  12/11/19 (!) 350 lb (158.8 kg)  11/30/19 (!) 375 lb 3.6 oz (170.2 kg)  10/29/19 (!) 335 lb (152 kg)   BP Readings from Last 3 Encounters:  01/01/20 116/69  12/26/19 134/88  12/16/19 (!) 146/82      Patient Care Plan: Diabetes Type 2 (Adult)    Problem Identified: Currently meeting A1C target but having large fluctuations in blood sugars   Priority: High  Onset Date: 12/15/2019    Goal: Glycemic Management Optimized without large fluctuations in blood sugars   Start Date: 12/15/2019  Expected End Date: 04/08/2020  This Visit's Progress: On track  Recent Progress: On track  Priority: High  Note:   Objective:  Lab Results  Component Value Date   HGBA1C 5.1 11/28/2019 .   Lab Results  Component Value Date   CREATININE 3.58 (H) 12/01/2019   CREATININE 3.83 (H) 11/30/2019    CREATININE 4.41 (H) 11/29/2019 .   Marland Kitchen No results found for: EGFR Current Barriers:  Marland Kitchen Knowledge Deficits related to basic Diabetes pathophysiology and self care/management . Unable to independently meet pre and post meal CBG targets - patient reports no extreme lows in his blood sugars however still quite variable, patient still resistant to trying CGM, per Dr. Ronnie Derby office visit note on 12/16/19, he plans to discuss the [facility] with patient at next office visit Case Manager Clinical Goal(s):  Marland Kitchen Over the next 30-60 days, patient will demonstrate improved adherence to prescribed treatment plan for diabetes self care/management as evidenced by:  Marland Kitchen  daily monitoring and recording of CBG  . adherence to ADA/ carb modified diet . exercise 5-7 days/week- adherence to prescribed medication regimen Interventions:  . Review of patient status, including review of consultants reports, relevant laboratory and other test results, and medications completed. . Provided education to patient about basic DM disease process and negative impact of highly fluctuating CBGs on all body systems . Reviewed self monitored CBGs and frequency and severity of hypoglycemia . Assessed patient's ability to self treat hypoglycemia . Encouraged patient to be more consistent in checking blood sugars . Reviewed medications with patient and discussed importance of good medication taking behavior . Patient encouraged to continue his exercise routine with his personal trainer . - A1C testing facilitated . - barriers to adherence to treatment plan identified . - dental care encouraged . - mutual A1C goal set or reviewed . - devices to improve adherence to care identified . - self-awareness of signs/symptoms of hypo or hyperglycemia encouraged . Discussed plans with patient for ongoing care management follow up and provided patient with direct contact information for care management team . Provided patient with written  educational materials related to hypo and hyperglycemia and importance of correct treatment . Reviewed scheduled/upcoming provider appointments including: with Dr Dwyane Dee on 01/21/20 . In the next  30-60 days, patient will:  - Self administers oral medications as prescribed Self administers insulin as prescribed Self administers injectable DM medication Victoza and Tyler Aas as prescribed Attends all scheduled provider appointments Checks blood sugars as prescribed and utilize hyper and hypoglycemia protocol as needed Adheres to prescribed ADA/carb modified Follow Up Plan: The care management team will reach out to the patient again over the next 30-60 days.       Plan:   The care management team will reach out to the patient again over the next 30-60 days.    Kelli Churn RN, CCM, [facility] RN Care Manager [phone]

## 2020-01-01 NOTE — Progress Notes (Signed)
Internal Medicine Clinic Resident  I have personally reviewed this encounter including the documentation in this note and/or discussed this patient with the care management provider. I will address any urgent items identified by the care management provider and will communicate my actions to the patient's PCP. I have reviewed the patient's CCM visit with my supervising attending, Dr Daryll Drown.  Jean Rosenthal, MD 01/01/2020

## 2020-01-01 NOTE — Patient Instructions (Signed)
Visit Information  Patient Care Plan: Diabetes Type 2 (Adult)    Problem Identified: Currently meeting A1C target but having large fluctuations in blood sugars   Priority: High  Onset Date: 12/15/2019    Goal: Glycemic Management Optimized without large fluctuations in blood sugars   Start Date: 12/15/2019  Expected End Date: 04/08/2020  This Visit's Progress: On track  Recent Progress: On track  Priority: High  Note:   Objective:  Lab Results  Component Value Date   HGBA1C 5.1 11/28/2019 .   Lab Results  Component Value Date   CREATININE 3.58 (H) 12/01/2019   CREATININE 3.83 (H) 11/30/2019   CREATININE 4.41 (H) 11/29/2019 .   Marland Kitchen No results found for: EGFR Current Barriers:  Marland Kitchen Knowledge Deficits related to basic Diabetes pathophysiology and self care/management . Unable to independently meet pre and post meal CBG targets - patient reports no extreme lows in his blood sugars however still quite variable, patient still resistant to trying CGM, per Dr. Ronnie Derby office visit note on 12/16/19, he plans to discuss the [facility] with patient at next office visit Case Manager Clinical Goal(s):  Marland Kitchen Over the next 30-60 days, patient will demonstrate improved adherence to prescribed treatment plan for diabetes self care/management as evidenced by:  . daily monitoring and recording of CBG  . adherence to ADA/ carb modified diet . exercise 5-7 days/week- adherence to prescribed medication regimen Interventions:  . Review of patient status, including review of consultants reports, relevant laboratory and other test results, and medications completed. . Provided education to patient about basic DM disease process and negative impact of highly fluctuating CBGs on all body systems . Reviewed self monitored CBGs and frequency and severity of hypoglycemia . Assessed patient's ability to self treat hypoglycemia . Encouraged patient to be more consistent in checking blood sugars . Reviewed  medications with patient and discussed importance of good medication taking behavior . Patient encouraged to continue his exercise routine with his personal trainer . - A1C testing facilitated . - barriers to adherence to treatment plan identified . - dental care encouraged . - mutual A1C goal set or reviewed . - devices to improve adherence to care identified . - self-awareness of signs/symptoms of hypo or hyperglycemia encouraged . Discussed plans with patient for ongoing care management follow up and provided patient with direct contact information for care management team . Provided patient with written educational materials related to hypo and hyperglycemia and importance of correct treatment . Reviewed scheduled/upcoming provider appointments including: with Dr Dwyane Dee on 01/21/20 . In the next 30-60 days, patient will:  - Self administers oral medications as prescribed Self administers insulin as prescribed Self administers injectable DM medication Victoza and Tyler Aas as prescribed Attends all scheduled provider appointments Checks blood sugars as prescribed and utilize hyper and hypoglycemia protocol as needed Adheres to prescribed ADA/carb modified Follow Up Plan: The care management team will reach out to the patient again over the next 30-60 days.       The patient verbalized understanding of instructions, educational materials, and care plan provided today and declined offer to receive copy of patient instructions, educational materials, and care plan.   The care management team will reach out to the patient again over the next 30-60 days.   Kelli Churn RN, CCM, [facility] RN Care Manager [phone]

## 2020-01-05 ENCOUNTER — Encounter (HOSPITAL_COMMUNITY): Payer: Medicare Other

## 2020-01-05 NOTE — Progress Notes (Signed)
Internal Medicine Clinic Attending  CCM services provided by the care management provider and their documentation were discussed with Dr. Eileen Stanford. We reviewed the pertinent findings, urgent action items addressed by the resident and non-urgent items to be addressed by the PCP.  I agree with the assessment, diagnosis, and plan of care documented in the CCM and resident's note.  Gilles Chiquito, MD 01/05/2020

## 2020-01-06 ENCOUNTER — Telehealth: Payer: Self-pay | Admitting: *Deleted

## 2020-01-06 NOTE — Telephone Encounter (Signed)
Initial medication would be pepto-bismol

## 2020-01-06 NOTE — Telephone Encounter (Signed)
Dr Sharon Seller what medications do you recommend? We do not have any in person nor telehealth appts today nor tomorrow. Thanks

## 2020-01-06 NOTE — Telephone Encounter (Signed)
Called pt x 2  - no answer; left message to call the office.

## 2020-01-06 NOTE — Telephone Encounter (Signed)
Call from pt- c/o nausea; stated it started yesterday and was all of a sudden. He has not eaten since then nor tried any OTC med. His blood sugars are 180 - 240. Denies any other symptoms. Please advise.  Thanks

## 2020-01-06 NOTE — Telephone Encounter (Signed)
He could try over the counter anti-nausea medication, but if he is continuing to have nausea he can schedule an in person clinic visit. If he starts having vomiting or other symptoms he may want to go to urgent care if he cannot be seen in clinic today.

## 2020-01-07 NOTE — Telephone Encounter (Signed)
Pt calls and states he missed the call 12/28, he states nothing has changed for the better or worse. He is keeping liquids down but has not had solid foods since Monday. He is given dr seawell's instructions and suggested urg care visit, he is agreeable.

## 2020-01-13 ENCOUNTER — Other Ambulatory Visit: Payer: Self-pay | Admitting: Internal Medicine

## 2020-01-13 ENCOUNTER — Other Ambulatory Visit: Payer: Self-pay | Admitting: Student

## 2020-01-13 DIAGNOSIS — I5032 Chronic diastolic (congestive) heart failure: Secondary | ICD-10-CM

## 2020-01-14 NOTE — Telephone Encounter (Signed)
I will call the patient today to discuss these medications with him. Unfortunately these medications were not discussed at his prior visit in December and some have not been refilled in several months.

## 2020-01-15 ENCOUNTER — Other Ambulatory Visit: Payer: Medicare Other

## 2020-01-15 ENCOUNTER — Other Ambulatory Visit (INDEPENDENT_AMBULATORY_CARE_PROVIDER_SITE_OTHER): Payer: Medicare Other

## 2020-01-15 ENCOUNTER — Telehealth: Payer: Self-pay

## 2020-01-15 ENCOUNTER — Other Ambulatory Visit: Payer: Self-pay

## 2020-01-15 DIAGNOSIS — Z794 Long term (current) use of insulin: Secondary | ICD-10-CM

## 2020-01-15 DIAGNOSIS — E1165 Type 2 diabetes mellitus with hyperglycemia: Secondary | ICD-10-CM

## 2020-01-15 LAB — LIPID PANEL
Cholesterol: 118 mg/dL (ref 0–200)
HDL: 30.6 mg/dL — ABNORMAL LOW (ref >39.00)
LDL Cholesterol: 60 mg/dL (ref 0–99)
NonHDL: 87.56
Total CHOL/HDL Ratio: 4
Triglycerides: 140 mg/dL (ref 0.0–149.0)
VLDL: 28 mg/dL (ref 0.0–40.0)

## 2020-01-15 LAB — COMPREHENSIVE METABOLIC PANEL
ALT: 18 U/L (ref 0–53)
AST: 22 U/L (ref 0–37)
Albumin: 3.6 g/dL (ref 3.5–5.2)
Alkaline Phosphatase: 75 U/L (ref 39–117)
BUN: 50 mg/dL — ABNORMAL HIGH (ref 6–23)
CO2: 29 mEq/L (ref 19–32)
Calcium: 9.1 mg/dL (ref 8.4–10.5)
Chloride: 99 mEq/L (ref 96–112)
Creatinine, Ser: 3.35 mg/dL — ABNORMAL HIGH (ref 0.40–1.50)
GFR: 21.66 mL/min — ABNORMAL LOW (ref >60.00)
Glucose, Bld: 207 mg/dL — ABNORMAL HIGH (ref 70–99)
Potassium: 3.5 mEq/L (ref 3.5–5.1)
Sodium: 132 mEq/L — ABNORMAL LOW (ref 135–145)
Total Bilirubin: 0.4 mg/dL (ref 0.2–1.2)
Total Protein: 7.1 g/dL (ref 6.0–8.3)

## 2020-01-15 NOTE — Telephone Encounter (Signed)
Received TC from patient who states he went to [facility] last week to pick up his new sleeve for his prosthetic and thy couldn't find anything. Will send to [name] and Mamie to follow up. SChaplin, RN,BSN

## 2020-01-15 NOTE — Telephone Encounter (Signed)
Order for Prosthetic sleeve, along with demographics, and OV note from 10/01/2019 faxed on 10/06/2019 to [place] at [facility] at [phone]. Fax confirmation receipt received. Attempted to speak with someone at [facility]. No answer and no way to leave VM.

## 2020-01-16 LAB — FRUCTOSAMINE: Fructosamine: 378 umol/L — ABNORMAL HIGH (ref 0–285)

## 2020-01-16 NOTE — Telephone Encounter (Signed)
Spoke with Pamala Hurry at [facility]. States patient had an appt there yesterday with Fritz Pickerel for an adjustment. States Colletta Maryland no longer works there and they have no record of receiving faxed order on 10/06/2019. She requesting everything be re-faxed. This has been done with receipt confirmation. Hubbard Hartshorn, BSN, RN-BC

## 2020-01-21 ENCOUNTER — Ambulatory Visit: Payer: Medicare Other | Admitting: Endocrinology

## 2020-01-23 ENCOUNTER — Other Ambulatory Visit: Payer: Self-pay

## 2020-01-23 ENCOUNTER — Encounter (HOSPITAL_COMMUNITY)
Admission: RE | Admit: 2020-01-23 | Discharge: 2020-01-23 | Disposition: A | Discharge status: Home / Self Care | Payer: Medicare Other | Source: Ambulatory Visit | Attending: Nephrology | Admitting: Nephrology

## 2020-01-23 VITALS — BP 142/80 | HR 85 | Temp 97.3°F | Resp 18

## 2020-01-23 DIAGNOSIS — D631 Anemia in chronic kidney disease: Secondary | ICD-10-CM | POA: Diagnosis not present

## 2020-01-23 DIAGNOSIS — N183 Chronic kidney disease, stage 3 unspecified: Secondary | ICD-10-CM

## 2020-01-23 LAB — RENAL FUNCTION PANEL
Albumin: 3.2 g/dL — ABNORMAL LOW (ref 3.5–5.0)
Anion gap: 12 (ref 5–15)
BUN: 59 mg/dL — ABNORMAL HIGH (ref 6–20)
CO2: 23 mmol/L (ref 22–32)
Calcium: 9.6 mg/dL (ref 8.9–10.3)
Chloride: 99 mmol/L (ref 98–111)
Creatinine, Ser: 3.06 mg/dL — ABNORMAL HIGH (ref 0.61–1.24)
GFR, Estimated: 25 mL/min — ABNORMAL LOW (ref >60)
Glucose, Bld: 87 mg/dL (ref 70–99)
Phosphorus: 4.3 mg/dL (ref 2.5–4.6)
Potassium: 3.7 mmol/L (ref 3.5–5.1)
Sodium: 134 mmol/L — ABNORMAL LOW (ref 135–145)

## 2020-01-23 LAB — FERRITIN: Ferritin: 470 ng/mL — ABNORMAL HIGH (ref 24–336)

## 2020-01-23 LAB — IRON AND TIBC
Iron: 45 ug/dL (ref 45–182)
Saturation Ratios: 18 % (ref 17.9–39.5)
TIBC: 253 ug/dL (ref 250–450)
UIBC: 208 ug/dL

## 2020-01-23 LAB — POCT HEMOGLOBIN-HEMACUE: Hemoglobin: 11.1 g/dL — ABNORMAL LOW (ref 13.0–17.0)

## 2020-01-23 MED ORDER — DARBEPOETIN ALFA 200 MCG/0.4ML IJ SOSY
200.0000 ug | PREFILLED_SYRINGE | INTRAMUSCULAR | Status: DC
  Administered 2020-01-23: 200 ug via SUBCUTANEOUS

## 2020-01-23 MED ORDER — DARBEPOETIN ALFA 200 MCG/0.4ML IJ SOSY
PREFILLED_SYRINGE | INTRAMUSCULAR | Status: AC
  Filled 2020-01-23: qty 0.4

## 2020-01-28 ENCOUNTER — Other Ambulatory Visit: Payer: Self-pay

## 2020-01-29 ENCOUNTER — Ambulatory Visit (INDEPENDENT_AMBULATORY_CARE_PROVIDER_SITE_OTHER): Payer: Medicare Other | Admitting: Endocrinology

## 2020-01-29 ENCOUNTER — Encounter: Payer: Self-pay | Admitting: Endocrinology

## 2020-01-29 VITALS — BP 132/86 | HR 78 | Ht 74.0 in | Wt 350.0 lb

## 2020-01-29 DIAGNOSIS — E785 Hyperlipidemia, unspecified: Secondary | ICD-10-CM | POA: Diagnosis not present

## 2020-01-29 DIAGNOSIS — E1165 Type 2 diabetes mellitus with hyperglycemia: Secondary | ICD-10-CM

## 2020-01-29 DIAGNOSIS — Z794 Long term (current) use of insulin: Secondary | ICD-10-CM | POA: Diagnosis not present

## 2020-01-29 LAB — GLUCOSE, POCT (MANUAL RESULT ENTRY): POC Glucose: 147 mg/dl — AB (ref 70–99)

## 2020-01-29 NOTE — Progress Notes (Signed)
Patient ID: [name], male   DOB: December 20, 1975, 45 y.o.   MRN: [id number]          Reason for Appointment: Follow-up for Type 2 Diabetes    History of Present Illness:          Date of diagnosis of type 2 diabetes mellitus:  2001      His weight is about 400 pounds at the time of diagnosis He does not know what his initial blood sugars and circumstances were but he apparently was started on insulin initially He was not able to tolerate metformin because of diarrhea Subsequently has been on insulin only, level of control not available but his A1c was significantly high at 12.8 in 2015  Background history:    Recent history:   INSULIN regimen is:   60 units Tresiba once daily   NovoLog 10 breakfast 15-20  units before lunch and dinner   Non-insulin hypoglycemic drugs the patient is taking are:  Victoza: 1.8 mg daily   Current management, blood sugar patterns and problems identified:   His A1c was last 5.1, likely falsely low  Fructosamine is now 378   He has the wrong date programmed on his monitor but appears to be still checking blood sugars very infrequently  Not clear what his blood sugar patterns are; recent blood sugar range in the mornings 61-213  Previously was not told to increase his Tresiba to 60 units from 81 because of his high fasting readings  He is appearing to be checking his blood sugars mostly in the mornings possibly after breakfast  However has a couple of significantly high readings of 188 and 267 after lunch when he takes 15 units  His weight fluctuates and appears to have lost some weight now  He is exercising 6 days a week about an hour  Despite exercising a still takes 10 units of insulin for breakfast and has had readings as low as 61 after breakfast  No readings available after dinner  He thinks he is taking his Victoza regularly and tolerating 1.8 mg without side effects   Side effects from medications have been: Diarrhea with  metformin, vomiting with Ozempic  Glucose monitoring:  done at least 1 a day         Glucometer:   ACCU-CHEK [employer]  Blood sugars as above  Previous data:  PRE-MEAL Fasting Lunch Dinner Bedtime Overall  Glucose range: 190-260 90-154     Mean/median:        POST-MEAL PC Breakfast PC Lunch PC Dinner  Glucose range:    119-174  Mean/median:        Self-care:    Typical meal intake: Breakfast is egg whites, some oatmeal/grits and banana.  Usually small portions of meat or other protein at lunch and dinner and only a small amount of sweet potato at lunch or carbohydrate              Dietician visit, most recent: Unknown               Weight history:  Wt Readings from Last 3 Encounters:  01/29/20 (!) 350 lb (158.8 kg)  12/11/19 (!) 350 lb (158.8 kg)  11/30/19 (!) 375 lb 3.6 oz (170.2 kg)    Glycemic control:   Lab Results  Component Value Date   HGBA1C 5.1 11/28/2019   HGBA1C 5.9 (H) 08/30/2019   HGBA1C 6.5 (H) 07/03/2019   Lab Results  Component Value Date   MICROALBUR 160.61 (H) 08/14/2013  LDLCALC 60 01/15/2020   CREATININE 3.06 (H) 01/23/2020   Lab Results  Component Value Date   MICRALBCREAT 1,528.4 (H) 02/15/2017    Lab Results  Component Value Date   FRUCTOSAMINE 378 (H) 01/15/2020   FRUCTOSAMINE 263 12/04/2017         Reactions   Reglan [metoclopramide] Other (See Comments)   Dysphoric reaction   Metformin And Related Diarrhea   Ozempic (0.25 Or 0.5 Mg-dose) [semaglutide(0.25 Or 0.5mg -dos)] Nausea And Vomiting      Medication List       Accurate as of January 29, 2020 12:46 PM. If you have any questions, ask your nurse or doctor.        Accu-Chek Guide Me w/Device Kit Use to check blood sugar up to 3 times a day What changed:   how much to take  how to take this  when to take this  additional instructions   Accu-Chek Guide test strip Generic drug: glucose blood Check blood sugar up to 3 times a day What changed:   how much to  take  how to take this  when to take this  additional instructions   accu-chek softclix lancets Use to check blood sugars 5 times a day. Dx code:E11.9. Insulin dependent. What changed:   how much to take  how to take this  when to take this   Accu-Chek Softclix Lancets lancets Check blood sugar up to 3 times a day What changed:   how much to take  how to take this  when to take this  additional instructions   acidophilus Caps capsule Take 1 capsule by mouth daily.   allopurinol 100 MG tablet Commonly known as: ZYLOPRIM Take 2 tablets by mouth once daily   Alpha-Lipoic Acid 600 MG Caps Take 600 mg by mouth daily.   amLODipine 10 MG tablet Commonly known as: NORVASC Take 10 mg by mouth at bedtime.   B-12 500 MCG Subl Place 500 mcg under the tongue daily.   calcitRIOL 0.5 MCG capsule Commonly known as: ROCALTROL Take 2.5 mcg by mouth daily.   carvedilol 25 MG tablet Commonly known as: COREG Take 1 tablet (25 mg total) by mouth 2 (two) times daily with a meal.   cloNIDine 0.3 MG tablet Commonly known as: CATAPRES Take 1 tablet by mouth twice daily   Eliquis 5 MG Tabs tablet Generic drug: apixaban Take 1 tablet by mouth twice daily What changed: how much to take   famotidine 20 MG tablet Commonly known as: PEPCID Take 1 tablet (20 mg total) by mouth 2 (two) times daily. What changed:   when to take this  reasons to take this   Fish Oil 1000 MG Caps Take 1,000 mg by mouth at bedtime.   furosemide 80 MG tablet Commonly known as: LASIX Take 1 tablet by mouth twice daily   gabapentin 100 MG capsule Commonly known as: NEURONTIN TAKE 1 CAPSULE BY MOUTH THREE TIMES DAILY   hydrALAZINE 50 MG tablet Commonly known as: APRESOLINE Take 1 tablet (50 mg total) by mouth 3 (three) times daily.   lovastatin 20 MG tablet Commonly known as: MEVACOR Take 1 tablet by mouth once daily   Melatonin 10 MG Tabs Take 10 mg by mouth at bedtime as needed  (sleep).   ondansetron 4 MG tablet Commonly known as: ZOFRAN Take 1 tablet (4 mg total) by mouth every 6 (six) hours as needed for nausea.   oxybutynin 5 MG tablet Commonly known as: DITROPAN Take 5  mg by mouth 3 (three) times daily.   Pen Needles 3/16" 31G X 5 MM Misc Use five times daily What changed:   how much to take  how to take this  when to take this   B-D UF III MINI PEN NEEDLES 31G X 5 MM Misc Generic drug: Insulin Pen Needle USE  4 TIMES DAILY What changed:   how much to take  how to take this  when to take this  additional instructions   polyethylene glycol 17 g packet Commonly known as: MIRALAX / GLYCOLAX Take 17 g by mouth 2 (two) times daily. What changed:   when to take this  reasons to take this   potassium chloride SA 20 MEQ tablet Commonly known as: KLOR-CON Take 20 mEq by mouth 2 (two) times daily.   senna-docusate 8.6-50 MG tablet Commonly known as: Senokot-S Take 1 tablet by mouth daily. What changed:   when to take this  reasons to take this   sildenafil 50 MG tablet Commonly known as: VIAGRA Take 50 mg by mouth as needed for erectile dysfunction.   tamsulosin 0.4 MG Caps capsule Commonly known as: FLOMAX Take 0.4 mg by mouth at bedtime.   Tyler Aas FlexTouch 200 UNIT/ML FlexTouch Pen Generic drug: insulin degludec INJECT 45 UNITS SUBCUTANEOUSLY ONCE DAILY What changed:   how much to take  additional instructions   Victoza 18 MG/3ML Sopn Generic drug: liraglutide INJECT 1.8 MG INTO THE SKIN ONCE DAILY AT THE SAME TIME.       Allergies:  Allergies  Allergen Reactions  . Reglan [Metoclopramide] Other (See Comments)    Dysphoric reaction  . Metformin And Related Diarrhea  . Ozempic (0.25 Or 0.5 Mg-Dose) [Semaglutide(0.25 Or 0.76m-Dos)] Nausea And Vomiting    Past Medical History:  Diagnosis Date  . Acute venous embolism and thrombosis of deep vessels of proximal lower extremity ([facility] 07/19/2011  . Anemia   .  Anginal pain ([facility]    pt denies  . Anxiety   . Chest pain, neg MI, normal coronaries by cath 02/18/2013   pt denies  . CHF (congestive heart failure) ([facility]   . CKD (chronic kidney disease) stage 3, GFR 30-59 ml/min (HCC) 02/19/2013  . Colon polyps   . Depression   . Diabetic ulcer of right foot ([facility]   . DVT (deep venous thrombosis) ([facility] 09/2002   patient reports additional DVTs in '06 & '11 (unconfirmed)  . ED (erectile dysfunction)   . GERD (gastroesophageal reflux disease)   . History of blood transfusion    "related to OR" (10/31/2016)  . Hyperlipidemia 02/19/2013  . Hypertension   . Nausea & vomiting    "constant for the last couple weeks" (10/31/2016)  . Nephrotic syndrome   . Obesity    BMI 44, weight 346 pounds 01/30/14  . OSA (obstructive sleep apnea) 02/28/2018   Mild obstructive sleep apnea with an AHI of 9.8/h but severe during REM sleep with an AHI of 33.8/h.  Oxygen saturations dropped to 86% and there was moderate snoring  . Peripheral edema   . Pneumonia   . Prosthesis adjustment 08/17/2016  . Pulmonary embolism ([facility] 09/2002   treated with 6 months of warfarin  . Pyelonephritis 02/02/2018  . Type I diabetes mellitus ([facility] dx'd 2001  . Urinary retention     Past Surgical History:  Procedure Laterality Date  . AMPUTATION Right 12/22/2013   Procedure: AMPUTATION BELOW KNEE;  Surgeon: JWylene Simmer MD;  [facility]  Service: Orthopedics;  Laterality: Right;  . AMPUTATION Right 02/19/2014   Procedure: RIGHT ABOVE KNEE AMPUTATION ;  Surgeon: [name], MD;  Location: [facility];  Service: Orthopedics;  Laterality: Right;  . blood clot removal  2016  . CARDIAC CATHETERIZATION  02/18/2013   normal coronaries  . ESOPHAGOGASTRODUODENOSCOPY N/A 11/02/2016   Procedure: ESOPHAGOGASTRODUODENOSCOPY (EGD);  Surgeon: [name], MD;  Location[facility];  Service: Endoscopy;  Laterality: N/A;  . I & D EXTREMITY Right 12/14/2013   Procedure: IRRIGATION AND DEBRIDEMENT  RIGHT FOOT;  Surgeon: [name], MD;  Location: [facility];  Service: Orthopedics;  Laterality: Right;  . INCISION AND DRAINAGE ABSCESS  2007; 2015   "back"  . INCISION AND DRAINAGE OF WOUND Right 12/22/2013   Procedure: I&D RIGHT BUTTOCK;  Surgeon: [name], MD;  Location: [facility];  Service: Orthopedics;  Laterality: Right;  . LEFT HEART CATHETERIZATION WITH CORONARY ANGIOGRAM N/A 02/18/2013   Procedure: LEFT HEART CATHETERIZATION WITH CORONARY ANGIOGRAM;  Surgeon: [name], MD;  Location: [facility] CATH LAB;  Service: Cardiovascular;  Laterality: N/A;    Family History  Problem Relation Age of Onset  . Heart disease Mother   . Diabetes Mother   . Diabetes Maternal Uncle   . Diabetes Cousin     Social History:  reports that he has never smoked. He has never used smokeless tobacco. He reports current alcohol use. He reports that he does not use drugs.   Review of Systems    Lipid history: Has been on lovastatin 20 mg with labs as follows: Prescribed by PCP   Lab Results  Component Value Date   CHOL 118 01/15/2020   HDL 30.60 (L) 01/15/2020   LDLCALC 60 01/15/2020   TRIG 140.0 01/15/2020   CHOLHDL 4 01/15/2020           Hypertension: He is taking clonidine, Norvasc, hydralazine and Coreg followed by nephrologist  BP Readings from Last 3 Encounters:  01/29/20 132/86  01/23/20 (!) 142/80  01/01/20 116/69     Most recent eye exam was in 63/8466   Complications of diabetes: Nephropathy with renal insufficiency and proteinuria, unknown status of retinopathy, erectile dysfunction  Followed by nephrologist for CKD Recently anemia better  Lab Results  Component Value Date   CREATININE 3.06 (H) 01/23/2020   CREATININE 3.35 (H) 01/15/2020   CREATININE 2.72 (H) 12/26/2019    Lab Results  Component Value Date   HGB 11.1 (L) 01/23/2020      LABS:  Office Visit on 01/29/2020  Component Date Value Ref Range Status  . POC Glucose 01/29/2020 147* 70 - 99 mg/dl  Final  Hospital Outpatient Visit on 01/23/2020  Component Date Value Ref Range Status  . Ferritin 01/23/2020 470* 24 - 336 ng/mL Final   Performed at [facility] [address] [place], [place] [address]  . Iron 01/23/2020 45  45 - 182 ug/dL Final  . TIBC 01/23/2020 253  250 - 450 ug/dL Final  . Saturation Ratios 01/23/2020 18  17.9 - 39.5 % Final  . UIBC 01/23/2020 208  ug/dL Final   Performed at [facility], [place] [address], [place], [place] [address]  . Sodium 01/23/2020 134* 135 - 145 mmol/L Final  . Potassium 01/23/2020 3.7  3.5 - 5.1 mmol/L Final  . Chloride 01/23/2020 99  98 - 111 mmol/L Final  . CO2 01/23/2020 23  22 - 32 mmol/L Final  . Glucose, Bld 01/23/2020 87  70 - 99 mg/dL  Final   Glucose reference range applies only to samples taken after fasting for at least 8 hours.  . BUN 01/23/2020 59* 6 - 20 mg/dL Final  . Creatinine, Ser 01/23/2020 3.06* 0.61 - 1.24 mg/dL Final  . Calcium 01/23/2020 9.6  8.9 - 10.3 mg/dL Final  . Phosphorus 01/23/2020 4.3  2.5 - 4.6 mg/dL Final  . Albumin 01/23/2020 3.2* 3.5 - 5.0 g/dL Final  . GFR, Estimated 01/23/2020 25* >60 mL/min Final   Comment: (NOTE) Calculated using the CKD-EPI Creatinine Equation (2021)   . Anion gap 01/23/2020 12  5 - 15 Final   Performed at [facility] [address] [place], [place] [address]  . Hemoglobin 01/23/2020 11.1* 13.0 - 17.0 g/dL Final    Physical Examination:  BP 132/86   Pulse 78   Ht _0  (1.88 m)   Wt (!) 350 lb (158.8 kg)   SpO2 96%   BMI 44.94 kg/m       ASSESSMENT:  Diabetes type 2, with morbid obesity  See history of present illness for detailed discussion of current diabetes management, blood sugar patterns and problems identified  His last A1c is last 5.1, however fructosamine of 378 is higher  Is on basal bolus insulin and Victoza  Blood sugar monitoring is inadequate and his meter has the wrong date programmed and difficult to identify patterns Likely  getting higher readings after lunch and possibly after supper but not clear if his fasting readings are consistently high  He thinks he is trying to improve his diet As before is trying to exercise  Hypercholesterolemia: Currently well controlled with statin treatment  PLAN:   He will stay on 1.8 mg Victoza No change in [place] Likely needs to increase his lunchtime dose to 20 units while continuing 25 NovoLog at dinnertime May reduce NovoLog to 5 units in the morning for breakfast or less morning sugars are higher Freestyle libre to be started and discussed in detail how this is going to be used and data provided as well as how to monitor Will fax prescription to [facility] Consistent monitoring after different meals and he will program the meter for the right date  Follow-up in 6 weeks   Patient Instructions  5 Novolog in am 20 at lunch and 25 at supper  Reset date  On meter      [name] 01/29/2020, 12:46 PM   Note: This office note was prepared with Dragon voice recognition system technology. Any transcriptional errors that result from this process are unintentional.

## 2020-01-29 NOTE — Patient Instructions (Signed)
5 Novolog in am 20 at lunch and 25 at supper  Reset date  On meter

## 2020-01-30 ENCOUNTER — Telehealth: Payer: Medicare Other

## 2020-02-04 DIAGNOSIS — N185 Chronic kidney disease, stage 5: Secondary | ICD-10-CM | POA: Diagnosis not present

## 2020-02-04 DIAGNOSIS — I120 Hypertensive chronic kidney disease with stage 5 chronic kidney disease or end stage renal disease: Secondary | ICD-10-CM | POA: Diagnosis not present

## 2020-02-04 DIAGNOSIS — N2581 Secondary hyperparathyroidism of renal origin: Secondary | ICD-10-CM | POA: Diagnosis not present

## 2020-02-04 DIAGNOSIS — E876 Hypokalemia: Secondary | ICD-10-CM | POA: Diagnosis not present

## 2020-02-04 DIAGNOSIS — E669 Obesity, unspecified: Secondary | ICD-10-CM | POA: Diagnosis not present

## 2020-02-10 ENCOUNTER — Other Ambulatory Visit: Payer: Self-pay | Admitting: Endocrinology

## 2020-02-10 ENCOUNTER — Other Ambulatory Visit: Payer: Self-pay | Admitting: Internal Medicine

## 2020-02-10 ENCOUNTER — Telehealth: Payer: Medicare Other

## 2020-02-10 DIAGNOSIS — E119 Type 2 diabetes mellitus without complications: Secondary | ICD-10-CM

## 2020-02-10 DIAGNOSIS — Z794 Long term (current) use of insulin: Secondary | ICD-10-CM

## 2020-02-10 NOTE — Telephone Encounter (Signed)
Note placed for appointment scheduling.

## 2020-02-12 DIAGNOSIS — H3562 Retinal hemorrhage, left eye: Secondary | ICD-10-CM | POA: Diagnosis not present

## 2020-02-12 DIAGNOSIS — H35031 Hypertensive retinopathy, right eye: Secondary | ICD-10-CM | POA: Diagnosis not present

## 2020-02-12 DIAGNOSIS — E113211 Type 2 diabetes mellitus with mild nonproliferative diabetic retinopathy with macular edema, right eye: Secondary | ICD-10-CM | POA: Diagnosis not present

## 2020-02-12 DIAGNOSIS — E113212 Type 2 diabetes mellitus with mild nonproliferative diabetic retinopathy with macular edema, left eye: Secondary | ICD-10-CM | POA: Diagnosis not present

## 2020-02-12 DIAGNOSIS — Q141 Congenital malformation of retina: Secondary | ICD-10-CM | POA: Diagnosis not present

## 2020-02-12 DIAGNOSIS — H35032 Hypertensive retinopathy, left eye: Secondary | ICD-10-CM | POA: Diagnosis not present

## 2020-02-13 ENCOUNTER — Other Ambulatory Visit: Payer: Self-pay | Admitting: Internal Medicine

## 2020-02-13 DIAGNOSIS — E119 Type 2 diabetes mellitus without complications: Secondary | ICD-10-CM

## 2020-02-17 ENCOUNTER — Ambulatory Visit: Payer: Medicare Other | Admitting: *Deleted

## 2020-02-17 DIAGNOSIS — Z89611 Acquired absence of right leg above knee: Secondary | ICD-10-CM

## 2020-02-17 DIAGNOSIS — N183 Chronic kidney disease, stage 3 unspecified: Secondary | ICD-10-CM

## 2020-02-17 DIAGNOSIS — E1165 Type 2 diabetes mellitus with hyperglycemia: Secondary | ICD-10-CM

## 2020-02-17 DIAGNOSIS — E785 Hyperlipidemia, unspecified: Secondary | ICD-10-CM

## 2020-02-17 DIAGNOSIS — I10 Essential (primary) hypertension: Secondary | ICD-10-CM

## 2020-02-17 DIAGNOSIS — I5032 Chronic diastolic (congestive) heart failure: Secondary | ICD-10-CM

## 2020-02-17 NOTE — Chronic Care Management (AMB) (Signed)
Chronic Care Management   CCM RN Visit Note  02/17/2020 Name: [name] MRN: [id number] DOB: 24-Apr-1975  Subjective: [name] is a 45 y.o. year old male who is a primary care patient of [name], MD. The care management team was consulted for assistance with disease management and care coordination needs.    Engaged with patient by telephone for follow up visit in response to provider referral for case management and/or care coordination services.   Consent to Services:  The patient was given information about Chronic Care Management services, agreed to services, and gave verbal consent prior to initiation of services.  Please see initial visit note for detailed documentation.   Patient agreed to services and verbal consent obtained.   Assessment: Review of patient past medical history, allergies, medications, health status, including review of consultants reports, laboratory and other test data, was performed as part of comprehensive evaluation and provision of chronic care management services.   SDOH (Social Determinants of Health) assessments and interventions performed:    CCM Care Plan  Allergies  Allergen Reactions  . Reglan [Metoclopramide] Other (See Comments)    Dysphoric reaction  . Metformin And Related Diarrhea  . Ozempic (0.25 Or 0.5 Mg-Dose) [Semaglutide(0.25 Or 0.5mg -Dos)] Nausea And Vomiting    Outpatient Encounter Medications as of 02/17/2020  Medication Sig Note  . Accu-Chek Softclix Lancets lancets Check blood sugar up to 3 times a day (Patient taking differently: 1 each by Other route See admin instructions. Check blood sugar up to 4-5 times a day)   . acidophilus (RISAQUAD) CAPS capsule Take 1 capsule by mouth daily.   . Alpha-Lipoic Acid 600 MG CAPS Take 600 mg by mouth daily.   Marland Kitchen amLODipine (NORVASC) 10 MG tablet Take 10 mg by mouth at bedtime.   . carvedilol (COREG) 25 MG tablet Take 1 tablet (25 mg total) by mouth 2 (two) times daily with a  meal.   . furosemide (LASIX) 80 MG tablet Take 1 tablet by mouth twice daily 02/17/2020: Pt states he needs refills  . glucose blood (ACCU-CHEK GUIDE) test strip Check blood sugar up to 3 times a day (Patient taking differently: 1 each by Other route See admin instructions. Check blood sugar up to 4-5 times a day)   . insulin degludec (TRESIBA FLEXTOUCH) 200 UNIT/ML FlexTouch Pen INJECT 45 UNITS SUBCUTANEOUSLY ONCE DAILY (Patient taking differently: 48 Units. INJECT 48 UNITS SUBCUTANEOUSLY ONCE DAILY) 02/17/2020: Pt states he needs refills  . Insulin Pen Needle (B-D UF III MINI PEN NEEDLES) 31G X 5 MM MISC USE  4 TIMES DAILY (Patient taking differently: 1 each by Other route See admin instructions. USE 4-5 TIMES DAILY)   . Insulin Pen Needle (PEN NEEDLES 3/16") 31G X 5 MM MISC Use five times daily (Patient taking differently: 1 each by Other route See admin instructions. Use five times daily)   . Lancet Devices (ACCU-CHEK SOFTCLIX) lancets Use to check blood sugars 5 times a day. Dx code:E11.9. Insulin dependent. (Patient taking differently: 1 each by Other route See admin instructions. Use to check blood sugars 5 times a day. Dx code:E11.9. Insulin dependent.)   . lovastatin (MEVACOR) 20 MG tablet Take 1 tablet by mouth once daily   . Melatonin 10 MG TABS Take 10 mg by mouth at bedtime as needed (sleep).   . Omega-3 Fatty Acids (FISH OIL) 1000 MG CAPS Take 1,000 mg by mouth at bedtime.    . ondansetron (ZOFRAN) 4 MG tablet Take 1 tablet (4 mg total)  by mouth every 6 (six) hours as needed for nausea.   Marland Kitchen oxybutynin (DITROPAN) 5 MG tablet Take 5 mg by mouth 3 (three) times daily.    . polyethylene glycol (MIRALAX / GLYCOLAX) 17 g packet Take 17 g by mouth 2 (two) times daily. (Patient taking differently: Take 17 g by mouth as needed for mild constipation.)   . potassium chloride SA (KLOR-CON) 20 MEQ tablet Take 20 mEq by mouth 2 (two) times daily.   Marland Kitchen senna-docusate (SENOKOT-S) 8.6-50 MG tablet Take 1  tablet by mouth daily. (Patient taking differently: Take 1 tablet by mouth as needed for mild constipation.)   . sildenafil (VIAGRA) 50 MG tablet Take 50 mg by mouth as needed for erectile dysfunction.   . tamsulosin (FLOMAX) 0.4 MG CAPS capsule Take 0.4 mg by mouth at bedtime.    Marland Kitchen VICTOZA 18 MG/3ML SOPN INJECT 1.2 MG (0.2 MLS) INTO THE SKIN ONCE DAILY AT THE SAME TIME. NO FURTHER REFILLS UNTIL PATIENT IS SEEN IN THE OFFICE.   Marland Kitchen allopurinol (ZYLOPRIM) 100 MG tablet Take 2 tablets by mouth once daily (Patient not taking: Reported on 02/17/2020) 02/17/2020: Pt states this has been stopped by nephrologist  . Blood Glucose Monitoring Suppl (ACCU-CHEK GUIDE ME) w/Device KIT Use to check blood sugar up to 3 times a day (Patient taking differently: 1 each by Other route See admin instructions. Use to check blood sugar up to 4-5 times a day)   . calcitRIOL (ROCALTROL) 0.5 MCG capsule Take 2.5 mcg by mouth daily.  (Patient not taking: Reported on 02/17/2020) 02/17/2020: Pt states this was d/ced by nephrologist  . cloNIDine (CATAPRES) 0.3 MG tablet Take 1 tablet by mouth twice daily (Patient not taking: Reported on 02/17/2020) 02/17/2020: Pt states this was d/ced by nephrologist  . Cyanocobalamin (B-12) 500 MCG SUBL Place 500 mcg under the tongue daily.    Marland Kitchen ELIQUIS 5 MG TABS tablet Take 1 tablet by mouth twice daily (Patient taking differently: Take 5 mg by mouth 2 (two) times daily.)   . famotidine (PEPCID) 20 MG tablet Take 1 tablet (20 mg total) by mouth 2 (two) times daily. (Patient taking differently: Take 20 mg by mouth as needed for heartburn or indigestion.) 11/28/2019: .  . gabapentin (NEURONTIN) 100 MG capsule TAKE 1 CAPSULE BY MOUTH THREE TIMES DAILY   . hydrALAZINE (APRESOLINE) 50 MG tablet Take 1 tablet (50 mg total) by mouth 3 (three) times daily.    No facility-administered encounter medications on file as of 02/17/2020.    Patient Active Problem List   Diagnosis Date Noted  . Hypoglycemia 11/28/2019  .  Acute diastolic (congestive) heart failure ([place]) 09/07/2019  . Abdominal pain 08/29/2019  . Anemia associated with chronic renal failure   . CKD (chronic kidney disease)   . Peripheral edema   . Hypervolemia associated with renal insufficiency 07/30/2019  . Acute renal failure with acute tubular necrosis superimposed on stage 4 chronic kidney disease ([place])   . Acute pain of left knee 01/01/2019  . Hemoglobinuria 12/12/2018  . Hematoma of left lower leg 12/12/2018  . Urinary tract infection 09/04/2018  . Exposure to SARS-associated coronavirus 07/25/2018  . Headache 06/18/2018  . Leg swelling   . Cellulitis 04/12/2018  . Pain of left lower extremity 03/20/2018  . OSA (obstructive sleep apnea) 02/28/2018  . Chronic diastolic heart failure ([place]) 02/02/2018  . Healthcare maintenance 08/15/2017  . Erectile dysfunction 04/22/2017  . History of pulmonary embolus (PE) 06/09/2016  . Anxiety 05/04/2016  .  Depression 05/04/2016  . Hypogonadism in male 05/04/2016  . S/P AKA (above knee amputation) unilateral, right ([place]) 05/04/2016  . Urinary retention 05/04/2016  . Anemia of chronic disease 05/04/2016  . Morbid obesity with BMI of 40.0-44.9, adult ([place]) 10/14/2015  . Dyslipidemia 08/12/2014  . Pedal edema 08/12/2014  . Uncontrolled type 2 diabetes mellitus with hyperglycemia, with long-term current use of insulin ([place]) 08/12/2014  . Constipation   . Unilateral AKA, right ([place]) 12/26/2013  . Hypoglycemic event in diabetes ([place]) 12/17/2013  . Hyperlipidemia 02/19/2013  . S/P cardiac cath, 02/18/13, normal coronaries 02/19/2013  . Chronic renal insufficiency, stage 3 (moderate) ([place]) 02/19/2013  . Hypertension 07/24/2011  . Long term current use of anticoagulant therapy 07/19/2011  . History of DVT (deep vein thrombosis) 07/28/2009  . Morbid obesity ([place]) 10/26/2005  . Insulin dependent type 2 diabetes mellitus, controlled ([place]) 01/09/2002    Conditions to be addressed/monitored:IDDM,  HF, HLD, HTN, CKD, s/p R AKA 02/20/14  Care Plan : Diabetes Type 2 (Adult)  Updates made by Barrington Ellison, RN since 02/17/2020 12:00 AM    Problem: Currently meeting A1C target but having large fluctuations in blood sugars   Priority: High  Onset Date: 12/15/2019    Goal: Glycemic Management Optimized without large fluctuations in blood sugars   Start Date: 12/15/2019  Expected End Date: 07/08/2020  This Visit's Progress: On track  Recent Progress: On track  Priority: High  Note:   Objective:  Lab Results  Component Value Date   HGBA1C 5.1 11/28/2019 .   Lab Results  Component Value Date   CREATININE 3.58 (H) 12/01/2019   CREATININE 3.83 (H) 11/30/2019   CREATININE 4.41 (H) 11/29/2019 .   Marland Kitchen No results found for: EGFR Current Barriers:  Marland Kitchen Knowledge Deficits related to basic Diabetes pathophysiology and self care/management . Unable to independently meet pre and post meal CBG targets - patient reports no extreme lows in his blood sugars however still quite variable, patient says he plans to meet with [name] to try Freestyle CGM per Dr. Ronnie Derby suggestion, he says he saw his nephrologist and the following medications were stopped- allopurinol, clonidine, trimethoprim and metolazone, he says he needs refills on furosemide, novolog flex pen, and tresiba, he is also asking if a Rx was sent to [facility] at [facility] for a new prosthetic sleeve now needed due to his weight loss  Case Manager Clinical Goal(s):  Marland Kitchen Over the next 30-60 days, patient will demonstrate improved adherence to prescribed treatment plan for diabetes self care/management as evidenced by:  . daily monitoring and recording of CBG  . adherence to ADA/ carb modified diet . exercise 5-7 days/week- adherence to prescribed medication regimen Interventions:  . In the next 30-60 days, patient will:  - Self administers oral medications as prescribed Self administers insulin as prescribed Self administers injectable DM medication  Victoza and Tyler Aas as prescribed Attends all scheduled provider appointments Checks blood sugars as prescribed and utilize hyper and hypoglycemia protocol as needed Adheres to prescribed ADA/carb modified Follow Up Plan: The care management team will reach out to the patient again over the next 30-60 days.        Kelli Churn RN, CCM, [facility] RN Care Manager [phone]

## 2020-02-17 NOTE — Patient Instructions (Signed)
Visit Information It was nice speaking with you today. PATIENT GOALS: Patient Care Plan: Diabetes Type 2 (Adult)    Problem Identified: Currently meeting A1C target but having large fluctuations in blood sugars   Priority: High  Onset Date: 12/15/2019    Goal: Glycemic Management Optimized without large fluctuations in blood sugars   Start Date: 12/15/2019  Expected End Date: 07/08/2020  This Visit's Progress: On track  Recent Progress: On track  Priority: High  Note:   Objective:  Lab Results  Component Value Date   HGBA1C 5.1 11/28/2019 .   Lab Results  Component Value Date   CREATININE 3.58 (H) 12/01/2019   CREATININE 3.83 (H) 11/30/2019   CREATININE 4.41 (H) 11/29/2019 .   Marland Kitchen No results found for: EGFR Current Barriers:  Marland Kitchen Knowledge Deficits related to basic Diabetes pathophysiology and self care/management . Unable to independently meet pre and post meal CBG targets - patient reports no extreme lows in his blood sugars however still quite variable, patient says he plans to meet with [name] to try Freestyle CGM per Dr. Ronnie Derby suggestion, he says he saw his nephrologist and the following medications were stopped- allopurinol, clonidine, trimethoprim and metolazone, he says he needs refills on furosemide, novolog flex pen, and tresiba, he is also asking if a Rx was sent to [facility]ke[facility] at [place] for a new prosthetic sleeve now needed due to his weight loss  Case Manager Clinical Goal(s):  Marland Kitchen Over the next 30-60 days, patient will demonstrate improved adherence to prescribed treatment plan for diabetes self care/management as evidenced by:  . daily monitoring and recording of CBG  . adherence to ADA/ carb modified diet . exercise 5-7 days/week- adherence to prescribed medication regimen Interventions:  . Review of patient status, including review of consultants reports, relevant laboratory and other test results, and medications completed. . Provided education to patient about basic DM  disease process and negative impact of highly fluctuating CBGs on all body systems . Reviewed self monitored CBGs and frequency and severity of hypoglycemia . Assessed patient's ability to self treat hypoglycemia . Reviewed Dr Ronnie Derby note of 01/29/20 with emphasis on diabetes medications  . Encouraged patient to be more consistent in checking blood sugars . Encouraged patient to make appointment with Debera Lat RD, [facility] to initiate Freestyle Libre Flash Glucose montior . Reviewed medications with patient and discussed importance of good medication taking behavior . Patient encouraged to continue his exercise routine with his personal trainer . Positive  reinforcement given for his successful weight loss and consistency with personal training session . Advised patient rx for prosthetic sleeve was faxed to [facility] on 01/16/20 by [facility] RN . - A1C testing facilitated . - barriers to adherence to treatment plan identified . - dental care encouraged . - mutual A1C goal set or reviewed . - devices to improve adherence to care identified . - self-awareness of signs/symptoms of hypo or hyperglycemia encouraged . Discussed plans with patient for ongoing care management follow up and provided patient with direct contact information for care management team . Provided patient with written educational materials related to hypo and hyperglycemia and importance of correct treatment . Reviewed scheduled/upcoming provider appointments including: Aranesp injection on 2/11, for labs on 3/22 and with Dr Dwyane Dee on 3/24 . - A1C testing facilitated . - barriers to adherence to treatment plan identified . - dental care encouraged . - mutual A1C goal set or reviewed . - devices to improve adherence to care identified . -  self-awareness of signs/symptoms of hypo or hyperglycemia encouraged . Discussed plans with patient for ongoing care management follow up and provided patient with direct contact  information for care management team . Provided patient with written educational materials related to hypo and hyperglycemia and importance of correct treatment . Reviewed scheduled/upcoming provider appointments including: with Dr Dwyane Dee on 01/21/20 . In the next 30-60 days, patient will:  - Self administers oral medications as prescribed Self administers insulin as prescribed Self administers injectable DM medication Victoza and Tyler Aas as prescribed Attends all scheduled provider appointments Checks blood sugars as prescribed and utilize hyper and hypoglycemia protocol as needed Adheres to prescribed ADA/carb modified Follow Up Plan: The care management team will reach out to the patient again over the next 30-60 days.       The patient verbalized understanding of instructions, educational materials, and care plan provided today and declined offer to receive copy of patient instructions, educational materials, and care plan.     Kelli Churn RN, CCM, [facility] RN Care Manager [phone]

## 2020-02-19 MED ORDER — INSULIN ASPART 100 UNIT/ML FLEXPEN: PEN_INJECTOR | SUBCUTANEOUS | 11 refills | Status: DC

## 2020-02-19 MED ORDER — TRESIBA FLEXTOUCH 200 UNIT/ML ~~LOC~~ SOPN
PEN_INJECTOR | SUBCUTANEOUS | 1 refills | Status: DC
Start: 2020-02-19 — End: 2021-03-29

## 2020-02-19 NOTE — Addendum Note (Signed)
Addended by: Virl Axe on: 02/19/2020 10:23 AM   Modules accepted: Orders

## 2020-02-19 NOTE — Progress Notes (Signed)
Hey, I reordered his tresiba and I placed an order for novolog flex pen. Unsure why he did not have it on his med list. I reviewed the endocrinology note and used their recommendations for novolog dosing.  I did not refill his furosemide as it was refilled in January 2022 for a 3 month supply.

## 2020-02-20 ENCOUNTER — Encounter (HOSPITAL_COMMUNITY): Payer: Medicare Other

## 2020-02-24 ENCOUNTER — Telehealth: Payer: Self-pay

## 2020-02-24 ENCOUNTER — Telehealth: Payer: Self-pay | Admitting: Dietician

## 2020-02-24 MED ORDER — INSULIN ASPART 100 UNIT/ML FLEXPEN: PEN_INJECTOR | SUBCUTANEOUS | 11 refills | Status: DC

## 2020-02-24 NOTE — Telephone Encounter (Addendum)
Patient is interested in Continuous glucose monitoring. An appointment was scheduled for next week to assist him with this. Per [facility], they never sent anything out to him as they could not get in touch with him to get his okay.

## 2020-02-24 NOTE — Telephone Encounter (Signed)
Requesting to speak with a nurse, please call pt back.  

## 2020-02-24 NOTE — Telephone Encounter (Signed)
Noted  

## 2020-02-24 NOTE — Telephone Encounter (Signed)
Return pt's call - stated he's trying to get refill on Novolog flexpen; it was refilled 02/19/20 - stated he called [facility] and was told they have  not received a rx. I called [facility] - stated they did not get rx on 2/10; I will ask the doctor to re-send. Also he stated he needs a specialize wheelchair; he was told he was over the weight limit of 350 lbs; so now, he's down to 315 lbs. I will ask [name] and Mamie to f/u.

## 2020-02-26 ENCOUNTER — Other Ambulatory Visit: Payer: Self-pay | Admitting: Dietician

## 2020-02-26 DIAGNOSIS — E1165 Type 2 diabetes mellitus with hyperglycemia: Secondary | ICD-10-CM

## 2020-02-26 DIAGNOSIS — Z794 Long term (current) use of insulin: Secondary | ICD-10-CM

## 2020-02-26 NOTE — Progress Notes (Signed)
Referral per Dr. Wynetta Emery

## 2020-02-27 ENCOUNTER — Other Ambulatory Visit: Payer: Self-pay

## 2020-02-27 ENCOUNTER — Ambulatory Visit (HOSPITAL_COMMUNITY)
Admission: RE | Admit: 2020-02-27 | Discharge: 2020-02-27 | Disposition: A | Discharge status: Home / Self Care | Payer: Medicare Other | Source: Ambulatory Visit | Attending: Nephrology | Admitting: Nephrology

## 2020-02-27 VITALS — BP 138/80 | HR 92 | Temp 97.5°F | Resp 18

## 2020-02-27 DIAGNOSIS — N183 Chronic kidney disease, stage 3 unspecified: Secondary | ICD-10-CM | POA: Insufficient documentation

## 2020-02-27 DIAGNOSIS — D631 Anemia in chronic kidney disease: Secondary | ICD-10-CM | POA: Insufficient documentation

## 2020-02-27 LAB — RENAL FUNCTION PANEL
Albumin: 3.6 g/dL (ref 3.5–5.0)
Anion gap: 10 (ref 5–15)
BUN: 89 mg/dL — ABNORMAL HIGH (ref 6–20)
CO2: 24 mmol/L (ref 22–32)
Calcium: 9.6 mg/dL (ref 8.9–10.3)
Chloride: 99 mmol/L (ref 98–111)
Creatinine, Ser: 2.93 mg/dL — ABNORMAL HIGH (ref 0.61–1.24)
GFR, Estimated: 26 mL/min — ABNORMAL LOW (ref >60)
Glucose, Bld: 272 mg/dL — ABNORMAL HIGH (ref 70–99)
Phosphorus: 3.8 mg/dL (ref 2.5–4.6)
Potassium: 3.8 mmol/L (ref 3.5–5.1)
Sodium: 133 mmol/L — ABNORMAL LOW (ref 135–145)

## 2020-02-27 LAB — IRON AND TIBC
Iron: 47 ug/dL (ref 45–182)
Saturation Ratios: 16 % — ABNORMAL LOW (ref 17.9–39.5)
TIBC: 302 ug/dL (ref 250–450)
UIBC: 255 ug/dL

## 2020-02-27 LAB — FERRITIN: Ferritin: 391 ng/mL — ABNORMAL HIGH (ref 24–336)

## 2020-02-27 LAB — POCT HEMOGLOBIN-HEMACUE: Hemoglobin: 11.3 g/dL — ABNORMAL LOW (ref 13.0–17.0)

## 2020-02-27 MED ORDER — DARBEPOETIN ALFA 200 MCG/0.4ML IJ SOSY
200.0000 ug | PREFILLED_SYRINGE | INTRAMUSCULAR | Status: DC
Start: 2020-02-27 — End: 2020-02-28

## 2020-02-27 MED ORDER — DARBEPOETIN ALFA 200 MCG/0.4ML IJ SOSY
PREFILLED_SYRINGE | INTRAMUSCULAR | Status: AC
  Administered 2020-02-27: 200 ug
  Filled 2020-02-27: qty 0.4

## 2020-02-28 LAB — PTH, INTACT AND CALCIUM
Calcium, Total (PTH): 9.6 mg/dL (ref 8.7–10.2)
PTH: 45 pg/mL (ref 15–65)

## 2020-03-04 ENCOUNTER — Encounter: Payer: Self-pay | Admitting: Dietician

## 2020-03-04 ENCOUNTER — Encounter: Payer: Self-pay | Admitting: Internal Medicine

## 2020-03-04 ENCOUNTER — Other Ambulatory Visit: Payer: Self-pay

## 2020-03-04 ENCOUNTER — Ambulatory Visit (INDEPENDENT_AMBULATORY_CARE_PROVIDER_SITE_OTHER): Payer: Medicare Other | Admitting: Dietician

## 2020-03-04 ENCOUNTER — Ambulatory Visit: Payer: Medicare Other | Admitting: Dietician

## 2020-03-04 ENCOUNTER — Ambulatory Visit (INDEPENDENT_AMBULATORY_CARE_PROVIDER_SITE_OTHER): Payer: Medicare Other | Admitting: Internal Medicine

## 2020-03-04 VITALS — BP 141/79 | HR 85 | Temp 98.1°F | Ht 74.0 in | Wt 322.0 lb

## 2020-03-04 DIAGNOSIS — Z794 Long term (current) use of insulin: Secondary | ICD-10-CM

## 2020-03-04 DIAGNOSIS — E1165 Type 2 diabetes mellitus with hyperglycemia: Secondary | ICD-10-CM | POA: Diagnosis not present

## 2020-03-04 DIAGNOSIS — E119 Type 2 diabetes mellitus without complications: Secondary | ICD-10-CM | POA: Diagnosis not present

## 2020-03-04 DIAGNOSIS — R52 Pain, unspecified: Secondary | ICD-10-CM | POA: Diagnosis not present

## 2020-03-04 DIAGNOSIS — I10 Essential (primary) hypertension: Secondary | ICD-10-CM

## 2020-03-04 DIAGNOSIS — S78111A Complete traumatic amputation at level between right hip and knee, initial encounter: Secondary | ICD-10-CM

## 2020-03-04 LAB — POCT GLYCOSYLATED HEMOGLOBIN (HGB A1C): Hemoglobin A1C: 8 % — AB (ref 4.0–5.6)

## 2020-03-04 LAB — GLUCOSE, CAPILLARY: Glucose-Capillary: 257 mg/dL — ABNORMAL HIGH (ref 70–99)

## 2020-03-04 NOTE — Progress Notes (Signed)
   CC: Body soreness   HPI:  [name] is a 45 y.o. gentleman with medical history significant for CKD stage IV, diastolic heart failure, hypertension here with complaint of body soreness  Please see problem based charting for further details.  Past Medical History:  Diagnosis Date  . Acute venous embolism and thrombosis of deep vessels of proximal lower extremity ([place]) 07/19/2011  . Anemia   . Anginal pain ([place])    pt denies  . Anxiety   . Chest pain, neg MI, normal coronaries by cath 02/18/2013   pt denies  . CHF (congestive heart failure) ([place])   . CKD (chronic kidney disease) stage 3, GFR 30-59 ml/min ([facility]) 02/19/2013  . Colon polyps   . Depression   . Diabetic ulcer of right foot ([place])   . DVT (deep venous thrombosis) ([place]) 09/2002   patient reports additional DVTs in '06 & '11 (unconfirmed)  . ED (erectile dysfunction)   . GERD (gastroesophageal reflux disease)   . History of blood transfusion    "related to OR" (10/31/2016)  . Hyperlipidemia 02/19/2013  . Hypertension   . Nausea & vomiting    "constant for the last couple weeks" (10/31/2016)  . Nephrotic syndrome   . Obesity    BMI 44, weight 346 pounds 01/30/14  . OSA (obstructive sleep apnea) 02/28/2018   Mild obstructive sleep apnea with an AHI of 9.8/h but severe during REM sleep with an AHI of 33.8/h.  Oxygen saturations dropped to 86% and there was moderate snoring  . Peripheral edema   . Pneumonia   . Prosthesis adjustment 08/17/2016  . Pulmonary embolism ([place]) 09/2002   treated with 6 months of warfarin  . Pyelonephritis 02/02/2018  . Type I diabetes mellitus ([place]) dx'd 2001  . Urinary retention    Review of Systems:  As per HPI  Physical Exam:  Vitals:   03/04/20 1017  BP: (!) 141/79  Pulse: 85  Temp: 98.1 F (36.7 C)  TempSrc: Oral  SpO2: 99%  Weight: (!) 322 lb (146.1 kg)  Height: 6\' 2"  (1.88 m)   Physical Exam Vitals and nursing note reviewed.  Cardiovascular:     Rate and Rhythm:  Normal rate.     Heart sounds: Normal heart sounds.  Pulmonary:     Breath sounds: Normal breath sounds.  Musculoskeletal:     Comments: Right AKA with prosthetics  Neurological:     Mental Status: He is alert.  Psychiatric:        Mood and Affect: Mood normal.        Behavior: Behavior normal.     Assessment & Plan:   See Encounters Tab for problem based charting.  Patient discussed with Dr. Daryll Drown

## 2020-03-04 NOTE — Assessment & Plan Note (Signed)
#  Body soreness: [name] has been embarking on an exercise journey.  He states that he has a personal weight trainer and usually go to the gym at 7 AM in the morning and gets out around 7:45 AM.  His workout routine includes doing 150 push-ups a day, core exercises.  He states that after workouts, he would experience soreness at his shoulder, lower back and neck.  He has been keeping up with his oral hydration and heating pads.  Plan: -Advised to continue heating pads and supportive treatment -Congratulated him on this new adventure

## 2020-03-04 NOTE — Assessment & Plan Note (Signed)
#  Hypertension: Above goal today.  Last note from [facility] on January 26 showed blood pressure of 134/84.  We did a thorough medication review today.  I did offer to increase his hydralazine today but plan is to follow-up with [facility]  BP Readings from Last 3 Encounters:  03/04/20 (!) 141/79  02/27/20 138/80  01/29/20 132/86   Plan: -Continue amlodipine 10 mg daily -Continue Coreg 25 mg daily -Continue Lasix 50 mg daily -Continue hydralazine 50 mg 3 times daily -Clonidine has been discontinued

## 2020-03-04 NOTE — Patient Instructions (Signed)
[name],  It was a pleasure taking care of you here in the clinic.  As we discussed, please try heating pads and keep up with your hydration for the body soreness.  I am extremely happy that you are embarking on this exercise journey and I wish you the best of luck.  Take Care!

## 2020-03-04 NOTE — Assessment & Plan Note (Signed)
#  Type 2 diabetes mellitus: Unfortunately, his hemoglobin A1c today is 8% from 5.1% when it was checked.   Unfortunately, I do not have his routine ambulatory glucose log with me in the clinic.  It appears that he follows up with [facility]inology.  Due to his propensity to develop hypoglycemia, there has been a lot of adjustments made to his insulin regimen which Dr. Dwyane Dee is managing.  He was admitted in the hospital in November 2021 with hypoglycemia when he had CBG of 18.  Plan: -Follow-up with [facility] endocrinology  **Victoza 1.8 mg daily  **Tresiba 45 units daily  **NovoLog 20 units at lunch and 25 units at dinner (he was to hold his a.m. NovoLog when he goes to the gym)

## 2020-03-04 NOTE — Progress Notes (Signed)
Diabetes Self-Management Education  Visit Type: Follow-up  Appt. Start Time: 0900 Appt. End Time: 1000  03/04/2020  [name], identified by name and date of birth, is a 45 y.o. male with a diagnosis of Diabetes:  .Type 2 ASSESSMENT  Dexcom G6 Personal CGM Training  [name] was educated about the following:  -Getting to know device    (Phone programmed ) -Setting up device (high alert , low alert ) -Rise alert not set today  -Fall alert not set today  -Setting alert profile -Inserting sensor (he applied the sensor to his abdomen today) -Ending sensor session -Trouble shooting -Clarity information and overpatch information provided -Reviewed insulin dosing from Dexcom.   Patient has [facility] tech support and my contact information. Call to patient to see if the App connected to the transmitter. Patient says it has not. I advised he check to see if the transmitter was snapped in the sensor and flush. He said he would do this later because he was not in the right mind set to do it now.   [name] Plyler, RD 03/04/2020 1:16 PM.  Lab Results  Component Value Date   HGBA1C 8.0 (A) 03/04/2020   HGBA1C 5.1 11/28/2019   HGBA1C 5.9 (H) 08/30/2019   HGBA1C 6.5 (H) 07/03/2019   HGBA1C 7.3 (A) 04/18/2019    BP Readings from Last 3 Encounters:  03/04/20 (!) 141/79  02/27/20 138/80  01/29/20 132/86   Lab Results  Component Value Date   CHOL 118 01/15/2020   HDL 30.60 (L) 01/15/2020   LDLCALC 60 01/15/2020   TRIG 140.0 01/15/2020   CHOLHDL 4 01/15/2020   Wt Readings from Last 10 Encounters:  03/04/20 (!) 322 lb (146.1 kg)  03/04/20 (!) 322 lb 14.4 oz (146.5 kg)  01/29/20 (!) 350 lb (158.8 kg)  12/11/19 (!) 350 lb (158.8 kg)  11/30/19 (!) 375 lb 3.6 oz (170.2 kg)  10/29/19 (!) 335 lb (152 kg)  10/17/19 (!) 352 lb (159.7 kg)  10/01/19 (!) 352 lb (159.7 kg)  09/14/19 (!) 332 lb 14.3 oz (151 kg)  09/02/19 (!) 368 lb 2.7 oz (167 kg)    Weight (!) 322 lb 14.4 oz  (146.5 kg). Body mass index is 41.46 kg/m.   Diabetes Self-Management Education - 03/04/20 1000      Visit Information   Visit Type Follow-up      Health Coping   How would you rate your overall health? Fair      Psychosocial Assessment   Patient Belief/Attitude about Diabetes Motivated to manage diabetes    Self-care barriers Lack of transportation;Unsteady gait/risk for falls;Lack of material resources    Self-management support Doctor's office;Family;CDE visits    Patient Concerns Monitoring    Special Needs None    Preferred Learning Style Hands on;Visual    Learning Readiness Ready    How often do you need to have someone help you when you read instructions, pamphlets, or other written materials from your doctor or pharmacy? 1 - Never      Pre-Education Assessment   Patient understands the diabetes disease and treatment process. Demonstrates understanding / competency    Patient understands incorporating nutritional management into lifestyle. Needs Review   dm & CKD meal planning   Patient undertands incorporating physical activity into lifestyle. Demonstrates understanding / competency    Patient understands using medications safely. Demonstrates understanding / competency    Patient understands monitoring blood glucose, interpreting and using results Needs Instruction   on CGM per his  request   Patient understands prevention, detection, and treatment of acute complications. Needs Review   how to use trend arrows   Patient understands prevention, detection, and treatment of chronic complications. Demonstrates understanding / competency    Patient understands how to develop strategies to address psychosocial issues. Demonstrates understanding / competency    Patient understands how to develop strategies to promote health/change behavior. Demonstrates understanding / competency      Complications   Last HgB A1C per patient/outside source 5.6 %    How often do you check your  blood sugar? 1-2 times/day    Have you had a dilated eye exam in the past 12 months? Yes    Are you checking your feet? Yes    How many days per week are you checking your feet? 7      Patient Education   Monitoring Other (comment)   Dexcom G6 CGM education and sample provided     Outcomes   Expected Outcomes Demonstrated interest in learning. Expect positive outcomes    Future DMSE 2 wks    Program Status Not Completed      Subsequent Visit   Since your last visit have you continued or begun to take your medications as prescribed? Yes    Since your last visit have you had your blood pressure checked? Yes    Is your most recent blood pressure lower, unchanged, or higher since your last visit? Lower    Since your last visit have you experienced any weight changes? Loss    Weight Loss (lbs) 44   has lost 44# in 6 months intentionally   Since your last visit, are you checking your blood glucose at least once a day? Yes           Individualized Plan for Diabetes Self-Management Training:   Learning Objective:  Patient will have a greater understanding of diabetes self-management. Patient education plan is to attend individual and/or group sessions per assessed needs and concerns.   Plan:   Patient Instructions   Congrats on placing a Dexcom G6 Sensor and transmitter today! You are off and using a Continuous glucose monitor.   You should be hearing from [facility] about getting additional Dexcom sensors and transmitters. They have many phone numbers but here are ones that you can use to contact them./ I suggest you put them in your phone.   Patient support [phone] and [phone]   Remember to set your alerts on your Dexcom App after warm up. They are in the settings.   Also- I suggest putting that Dexcom care number in your phone. If you have nay problems they can help and often mail you new sensors or transmitters.   I suggest we follow up in about 2 weeks  or when you get your new sensors. DON'T Forget to KEEP the TRANSMITTER and CLEAN it with ALCOHOL BEFORE REUSING IT.  As always- cal me with questions or concerns,  [name] [phone]  Expected Outcomes:  Demonstrated interest in learning. Expect positive outcomes  Education material provided: Diabetes Resources  If problems or questions, patient to contact team via:  Phone  Future DSME appointment: 2 wks  Debera Lat, RD 03/04/2020 1:52 PM.

## 2020-03-04 NOTE — Patient Instructions (Signed)
Congrats on placing a Dexcom G6 Sensor and transmitter today! You are off and using a Continuous glucose monitor.   You should be hearing from [facility] about getting additional Dexcom sensors and transmitters. They have many phone numbers but here are ones that you can use to contact them./ I suggest you put them in your phone.   Patient support [phone] and [phone]   Remember to set your alerts on your Dexcom App after warm up. They are in the settings.   Also- I suggest putting that Dexcom care number in your phone. If you have nay problems they can help and often mail you new sensors or transmitters.   I suggest we follow up in about 2 weeks or when you get your new sensors. DON'T Forget to KEEP the TRANSMITTER and CLEAN it with ALCOHOL BEFORE REUSING IT.  As always- cal me with questions or concerns,  [name] [phone]

## 2020-03-05 ENCOUNTER — Telehealth: Payer: Self-pay | Admitting: Endocrinology

## 2020-03-05 ENCOUNTER — Telehealth: Payer: Self-pay | Admitting: Dietician

## 2020-03-05 ENCOUNTER — Ambulatory Visit: Payer: Medicare Other | Admitting: *Deleted

## 2020-03-05 DIAGNOSIS — I5032 Chronic diastolic (congestive) heart failure: Secondary | ICD-10-CM

## 2020-03-05 DIAGNOSIS — E1165 Type 2 diabetes mellitus with hyperglycemia: Secondary | ICD-10-CM

## 2020-03-05 DIAGNOSIS — E785 Hyperlipidemia, unspecified: Secondary | ICD-10-CM

## 2020-03-05 DIAGNOSIS — E119 Type 2 diabetes mellitus without complications: Secondary | ICD-10-CM

## 2020-03-05 DIAGNOSIS — N183 Chronic kidney disease, stage 3 unspecified: Secondary | ICD-10-CM

## 2020-03-05 DIAGNOSIS — Z89611 Acquired absence of right leg above knee: Secondary | ICD-10-CM

## 2020-03-05 DIAGNOSIS — I10 Essential (primary) hypertension: Secondary | ICD-10-CM

## 2020-03-05 DIAGNOSIS — Z794 Long term (current) use of insulin: Secondary | ICD-10-CM

## 2020-03-05 NOTE — Telephone Encounter (Signed)
MEDICATION: victoza  PHARMACY:   [facility] ([place]), [place] - [address] Phone:  [phone]  Fax:  [phone]     HAS THE PATIENT CONTACTED THEIR PHARMACY?  no  IS THIS A 90 DAY SUPPLY : he doesn't know  IS PATIENT OUT OF MEDICATION: no  IF NOT; HOW MUCH IS LEFT: a couple days left   LAST APPOINTMENT DATE: @2 /01/2020  NEXT APPOINTMENT DATE:@4 /07/2020  DO WE HAVE YOUR PERMISSION TO LEAVE A DETAILED MESSAGE?:   **Let patient know to contact pharmacy at the end of the day to make sure medication is ready. **  ** Please notify patient to allow 48-72 hours to process**  **Encourage patient to contact the pharmacy for refills or they can request refills through [facility]**

## 2020-03-05 NOTE — Telephone Encounter (Signed)
Called [name] to follow up and see if he got the Dexcom Continuous glucose monitor (CGM) running. He said it is and just needed a stronger signal. He would like [place] t send in the paperwork to [employer] to obtain a personal Continuous glucose monitor of his own. He knows his low and high alerts are set, but is not sure what they are set at. I suggested the low be set at 80 mg/dl or higher. He verbalized understanding.

## 2020-03-05 NOTE — Chronic Care Management (AMB) (Addendum)
   03/05/2020  [name] April 26, 1975 [id number]  Patient was in clinic on 03/04/20 and told staff he would like a newer lighter weight w/c as he wants to start w/c basketball.  His current w/c came from [employer] and is > 44 years old.  Plan: Community message sent to [employer] DME representatives for assistance in determining if patient is eligible and if so, for directions on how order should be written.   Kelli Churn RN, CCM, [facility] RN Care Manager [phone]

## 2020-03-06 ENCOUNTER — Other Ambulatory Visit: Payer: Self-pay | Admitting: Internal Medicine

## 2020-03-06 NOTE — Telephone Encounter (Signed)
Notified pt sent the Rx Victoza 02/11/20 with R-1. And needed to call the pharmacy for more refills. Pt understood without questions.

## 2020-03-08 ENCOUNTER — Other Ambulatory Visit: Payer: Self-pay | Admitting: *Deleted

## 2020-03-08 MED ORDER — PEN NEEDLES 3/16" 31G X 5 MM MISC
3 refills | Status: DC
Start: 2020-03-08 — End: 2021-04-11

## 2020-03-08 NOTE — Assessment & Plan Note (Signed)
Patient is status post right AKA from complications of osteomyelitis which impairs his ability to perform daily activities like (dressing, grooming, bathing) in the home. A cane, walker or crutch will not resolve issues. A wheelchair will allow the patient to safely perform daily activities. Patient can safely self-propel the wheelchair in the home or has a caregiver that can provide assistance.

## 2020-03-08 NOTE — Progress Notes (Signed)
Internal Medicine Clinic Resident  I have personally reviewed this encounter including the documentation in this note and/or discussed this patient with the care management provider. I will address any urgent items identified by the care management provider and will communicate my actions to the patient's PCP. I have reviewed the patient's CCM visit with my supervising attending, Dr Narendra.  Shakya Sebring Y Evangelyn Crouse, MD 03/08/2020    

## 2020-03-09 DIAGNOSIS — N2581 Secondary hyperparathyroidism of renal origin: Secondary | ICD-10-CM | POA: Diagnosis not present

## 2020-03-09 DIAGNOSIS — N184 Chronic kidney disease, stage 4 (severe): Secondary | ICD-10-CM | POA: Diagnosis not present

## 2020-03-09 DIAGNOSIS — E669 Obesity, unspecified: Secondary | ICD-10-CM | POA: Diagnosis not present

## 2020-03-09 DIAGNOSIS — I129 Hypertensive chronic kidney disease with stage 1 through stage 4 chronic kidney disease, or unspecified chronic kidney disease: Secondary | ICD-10-CM | POA: Diagnosis not present

## 2020-03-09 DIAGNOSIS — E876 Hypokalemia: Secondary | ICD-10-CM | POA: Diagnosis not present

## 2020-03-12 NOTE — Progress Notes (Signed)
Internal Medicine Clinic Attending  Case discussed with Dr. Agyei  At the time of the visit.  We reviewed the resident's history and exam and pertinent patient test results.  I agree with the assessment, diagnosis, and plan of care documented in the resident's note.  

## 2020-03-15 NOTE — Progress Notes (Signed)
Internal Medicine Clinic Attending  CCM services provided by the care management provider and their documentation were discussed with Dr. Chen. We reviewed the pertinent findings, urgent action items addressed by the resident and non-urgent items to be addressed by the PCP.  I agree with the assessment, diagnosis, and plan of care documented in the CCM and resident's note.  Nischal Narendra, MD 03/15/2020  

## 2020-03-16 ENCOUNTER — Ambulatory Visit: Payer: Medicare Other | Admitting: *Deleted

## 2020-03-16 ENCOUNTER — Other Ambulatory Visit: Payer: Self-pay | Admitting: Internal Medicine

## 2020-03-16 DIAGNOSIS — E1165 Type 2 diabetes mellitus with hyperglycemia: Secondary | ICD-10-CM

## 2020-03-16 DIAGNOSIS — N183 Chronic kidney disease, stage 3 unspecified: Secondary | ICD-10-CM

## 2020-03-16 DIAGNOSIS — Z89611 Acquired absence of right leg above knee: Secondary | ICD-10-CM

## 2020-03-16 DIAGNOSIS — Z794 Long term (current) use of insulin: Secondary | ICD-10-CM

## 2020-03-16 DIAGNOSIS — I10 Essential (primary) hypertension: Secondary | ICD-10-CM

## 2020-03-16 DIAGNOSIS — E119 Type 2 diabetes mellitus without complications: Secondary | ICD-10-CM

## 2020-03-16 DIAGNOSIS — I5032 Chronic diastolic (congestive) heart failure: Secondary | ICD-10-CM

## 2020-03-16 DIAGNOSIS — E785 Hyperlipidemia, unspecified: Secondary | ICD-10-CM

## 2020-03-16 NOTE — Chronic Care Management (AMB) (Signed)
Chronic Care Management   CCM RN Visit Note  03/16/2020 Name: [name] MRN: [id number] DOB: 06/21/1975  Subjective: [name] is a 44 y.o. year old male who is a primary care patient of Winters, Steven, MD. The care management team was consulted for assistance with disease management and care coordination needs.    Engaged with patient by telephone for follow up visit in response to provider referral for case management and/or care coordination services.   Consent to Services:  The patient was given information about Chronic Care Management services, agreed to services, and gave verbal consent prior to initiation of services.  Please see initial visit note for detailed documentation.   Patient agreed to services and verbal consent obtained.   Assessment: Review of patient past medical history, allergies, medications, health status, including review of consultants reports, laboratory and other test data, was performed as part of comprehensive evaluation and provision of chronic care management services.   SDOH (Social Determinants of Health) assessments and interventions performed:    CCM Care Plan  Allergies  Allergen Reactions  . Reglan [Metoclopramide] Other (See Comments)    Dysphoric reaction  . Metformin And Related Diarrhea  . Ozempic (0.25 Or 0.5 Mg-Dose) [Semaglutide(0.25 Or 0.5mg-Dos)] Nausea And Vomiting    Outpatient Encounter Medications as of 03/16/2020  Medication Sig Note  . Accu-Chek Softclix Lancets lancets Check blood sugar up to 3 times a day (Patient taking differently: 1 each by Other route See admin instructions. Check blood sugar up to 4-5 times a day)   . acidophilus (RISAQUAD) CAPS capsule Take 1 capsule by mouth daily.   . Alpha-Lipoic Acid 600 MG CAPS Take 600 mg by mouth daily.   . amLODipine (NORVASC) 10 MG tablet Take 10 mg by mouth at bedtime.   . apixaban (ELIQUIS) 5 MG TABS tablet Take 1 tablet (5 mg total) by mouth 2 (two) times daily.   .  Blood Glucose Monitoring Suppl (ACCU-CHEK GUIDE ME) w/Device KIT Use to check blood sugar up to 3 times a day (Patient taking differently: 1 each by Other route See admin instructions. Use to check blood sugar up to 4-5 times a day)   . carvedilol (COREG) 25 MG tablet Take 1 tablet (25 mg total) by mouth 2 (two) times daily with a meal.   . Cyanocobalamin (B-12) 500 MCG SUBL Place 500 mcg under the tongue daily.    . famotidine (PEPCID) 20 MG tablet Take 1 tablet (20 mg total) by mouth 2 (two) times daily. (Patient taking differently: Take 20 mg by mouth as needed for heartburn or indigestion.) 11/28/2019: .  . furosemide (LASIX) 80 MG tablet Take 1 tablet by mouth twice daily 02/17/2020: Pt states he needs refills  . gabapentin (NEURONTIN) 100 MG capsule TAKE 1 CAPSULE BY MOUTH THREE TIMES DAILY   . glucose blood (ACCU-CHEK GUIDE) test strip Check blood sugar up to 3 times a day (Patient taking differently: 1 each by Other route See admin instructions. Check blood sugar up to 4-5 times a day)   . hydrALAZINE (APRESOLINE) 50 MG tablet Take 1 tablet (50 mg total) by mouth 3 (three) times daily.   . insulin aspart (NOVOLOG) 100 UNIT/ML FlexPen Administer 5 units in the morning with breakfast, 20 units with lunch, and 25 units with dinner daily.  ICD Code:  E11.9   . insulin degludec (TRESIBA FLEXTOUCH) 200 UNIT/ML FlexTouch Pen INJECT 45 UNITS SUBCUTANEOUSLY ONCE DAILY   . Insulin Pen Needle (B-D UF III MINI PEN   NEEDLES) 31G X 5 MM MISC 1 each by Other route See admin instructions. USE 4-5 TIMES DAILY   . Insulin Pen Needle (PEN NEEDLES 3/16") 31G X 5 MM MISC Use five times daily   . Lancet Devices (ACCU-CHEK SOFTCLIX) lancets Use to check blood sugars 5 times a day. Dx code:E11.9. Insulin dependent. (Patient taking differently: 1 each by Other route See admin instructions. Use to check blood sugars 5 times a day. Dx code:E11.9. Insulin dependent.)   . lovastatin (MEVACOR) 20 MG tablet Take 1 tablet by  mouth once daily   . Omega-3 Fatty Acids (FISH OIL) 1000 MG CAPS Take 1,000 mg by mouth at bedtime.    . ondansetron (ZOFRAN) 4 MG tablet Take 1 tablet (4 mg total) by mouth every 6 (six) hours as needed for nausea.   . oxybutynin (DITROPAN) 5 MG tablet Take 5 mg by mouth 3 (three) times daily.    . polyethylene glycol (MIRALAX / GLYCOLAX) 17 g packet Take 17 g by mouth 2 (two) times daily. (Patient taking differently: Take 17 g by mouth as needed for mild constipation.)   . potassium chloride SA (KLOR-CON) 20 MEQ tablet Take 20 mEq by mouth 2 (two) times daily.   . senna-docusate (SENOKOT-S) 8.6-50 MG tablet Take 1 tablet by mouth daily. (Patient taking differently: Take 1 tablet by mouth as needed for mild constipation.)   . sildenafil (VIAGRA) 50 MG tablet Take 50 mg by mouth as needed for erectile dysfunction.   . tamsulosin (FLOMAX) 0.4 MG CAPS capsule Take 0.4 mg by mouth at bedtime.    . VICTOZA 18 MG/3ML SOPN INJECT 1.2 MG (0.2 MLS) INTO THE SKIN ONCE DAILY AT THE SAME TIME. NO FURTHER REFILLS UNTIL PATIENT IS SEEN IN THE OFFICE.    No facility-administered encounter medications on file as of 03/16/2020.    Patient Active Problem List   Diagnosis Date Noted  . Body aches 03/04/2020  . Hypoglycemia 11/28/2019  . Acute diastolic (congestive) heart failure (HCC) 09/07/2019  . Abdominal pain 08/29/2019  . Anemia associated with chronic renal failure   . CKD (chronic kidney disease)   . Peripheral edema   . Hypervolemia associated with renal insufficiency 07/30/2019  . Acute renal failure with acute tubular necrosis superimposed on stage 4 chronic kidney disease (HCC)   . Acute pain of left knee 01/01/2019  . Hemoglobinuria 12/12/2018  . Hematoma of left lower leg 12/12/2018  . Urinary tract infection 09/04/2018  . Exposure to SARS-associated coronavirus 07/25/2018  . Headache 06/18/2018  . Leg swelling   . Cellulitis 04/12/2018  . Pain of left lower extremity 03/20/2018  . OSA  (obstructive sleep apnea) 02/28/2018  . Chronic diastolic heart failure (HCC) 02/02/2018  . Healthcare maintenance 08/15/2017  . Erectile dysfunction 04/22/2017  . History of pulmonary embolus (PE) 06/09/2016  . Anxiety 05/04/2016  . Depression 05/04/2016  . Hypogonadism in male 05/04/2016  . S/P AKA (above knee amputation) unilateral, right (HCC) 05/04/2016  . Urinary retention 05/04/2016  . Anemia of chronic disease 05/04/2016  . Morbid obesity with BMI of 40.0-44.9, adult (HCC) 10/14/2015  . Dyslipidemia 08/12/2014  . Pedal edema 08/12/2014  . Uncontrolled type 2 diabetes mellitus with hyperglycemia, with long-term current use of insulin (HCC) 08/12/2014  . Constipation   . Unilateral AKA, right (HCC) 12/26/2013  . Hypoglycemic event in diabetes (HCC) 12/17/2013  . Hyperlipidemia 02/19/2013  . S/P cardiac cath, 02/18/13, normal coronaries 02/19/2013  . Chronic renal insufficiency, stage 3 (moderate) (HCC)   02/19/2013  . Hypertension 07/24/2011  . Long term current use of anticoagulant therapy 07/19/2011  . History of DVT (deep vein thrombosis) 07/28/2009  . Morbid obesity ([place]) 10/26/2005  . Insulin dependent type 2 diabetes mellitus, controlled ([place]) 01/09/2002    Conditions to be addressed/monitored:IDDM, HF, HLD, HTN, CKD, s/p R AKA 02/20/14  Care Plan : Diabetes Type 2 (Adult)  Updates made by Barrington Ellison, RN since 03/16/2020 12:00 AM    Problem: Currently meeting A1C target but having large fluctuations in blood sugars   Priority: High  Onset Date: 12/15/2019    Goal: Glycemic Management Optimized without large fluctuations in blood sugars   Start Date: 12/15/2019  Expected End Date: 07/08/2020  Recent Progress: On track  Priority: High  Note:   Objective:  . Lab Results .  Component . Value . Date .   Marland Kitchen HGBA1C . 8.0 (A) . 03/04/2020 .    Marland Kitchen Lab Results .  Component . Value . Date .   Marland Kitchen CREATININE . 2.93 (H) . 02/27/2020 .    Marland Kitchen No results found for:  EGFR Current Barriers:  Marland Kitchen Knowledge Deficits related to basic Diabetes pathophysiology and self care/management . Unable to independently meet pre and post meal CBG targets - successful outreach to patient. He states he is now wearing the Freestyle Libre flash glucose monitoring system and he loves it, he says the majority of his blood sugars are in range  Case Manager Clinical Goal(s):  Marland Kitchen Over the next 30-60 days, patient will demonstrate improved adherence to prescribed treatment plan for diabetes self care/management as evidenced by:  . daily monitoring and recording of CBG  . adherence to ADA/ carb modified diet . exercise 5-7 days/week- adherence to prescribed medication regimen Interventions:  . Review of patient status, including review of consultants reports, relevant laboratory and other test results, and medications completed. . Provided education to patient about basic DM disease process and negative impact of highly fluctuating CBGs on all body systems . Assessed satisfaction with Freestyle Libre Flash Glucose monitor . Assessed frequency of interstitial fluid glucose readings  being in target . Congratulated patient on initiating [employer] system . Reviewed medications with patient and discussed importance of good medication taking behavior . Patient encouraged to continue his exercise routine with his personal trainer . Positive  reinforcement given for his successful weight loss and consistency with personal training session . Informed patient the order for the w/c and the required documentation is in place and [employer] representatives have been notified . - A1C testing facilitated . - barriers to adherence to treatment plan identified . - dental care encouraged . - mutual A1C goal set or reviewed . - devices to improve adherence to care identified . - self-awareness of signs/symptoms of hypo or hyperglycemia encouraged . Discussed plans with patient for ongoing care  management follow up and provided patient with direct contact information for care management team . Reviewed scheduled/upcoming provider appointments including: Aranesp injection on 3/17, for labs on 4/7 and with Dr Dwyane Dee on 4/14 . - A1C testing facilitated . - barriers to adherence to treatment plan identified . - dental care encouraged . - mutual A1C goal set or reviewed . - devices to improve adherence to care identified . - self-awareness of signs/symptoms of hypo or hyperglycemia encouraged . Discussed plans with patient for ongoing care management follow up and provided patient with direct contact information for care management team  . In the next 30-60 days, patient  will:  - Self administers oral medications as prescribed Self administers insulin as prescribed Self administers injectable DM medication Victoza and Tresiba as prescribed Attends all scheduled provider appointments Checks blood sugars as prescribed and utilize hyper and hypoglycemia protocol as needed Adheres to prescribed ADA/carb modified Follow Up Plan: The care management team will reach out to the patient again over the next 30-60 days.      Janet Hauser RN, CCM, [facility] C[facility] Clinic RN Care Manager [phone]        

## 2020-03-16 NOTE — Patient Instructions (Signed)
Visit Information It was nice speaking with you today. PATIENT GOALS: Patient Care Plan: Diabetes Type 2 (Adult)    Problem Identified: Currently meeting A1C target but having large fluctuations in blood sugars   Priority: High  Onset Date: 12/15/2019    Goal: Glycemic Management Optimized without large fluctuations in blood sugars   Start Date: 12/15/2019  Expected End Date: 07/08/2020  Recent Progress: On track  Priority: High  Note:   Objective:  . Lab Results .  Component . Value . Date .   Marland Kitchen HGBA1C . 8.0 (A) . 03/04/2020 .    Marland Kitchen Lab Results .  Component . Value . Date .   Marland Kitchen CREATININE . 2.93 (H) . 02/27/2020 .    Marland Kitchen No results found for: EGFR Current Barriers:  Marland Kitchen Knowledge Deficits related to basic Diabetes pathophysiology and self care/management . Unable to independently meet pre and post meal CBG targets - successful outreach to patient. He states he is now wearing the [employer] flash glucose monitoring system and he loves it, he says the majority of his blood sugars are in range  Case Manager Clinical Goal(s):  Marland Kitchen Over the next 30-60 days, patient will demonstrate improved adherence to prescribed treatment plan for diabetes self care/management as evidenced by:  . daily monitoring and recording of CBG  . adherence to ADA/ carb modified diet . exercise 5-7 days/week- adherence to prescribed medication regimen Interventions:  . Review of patient status, including review of consultants reports, relevant laboratory and other test results, and medications completed. . Provided education to patient about basic DM disease process and negative impact of highly fluctuating CBGs on all body systems . Assessed satisfaction with Freestyle Libre Flash Glucose monitor . Assessed frequency of interstitial fluid glucose readings  being in target . Congratulated patient on initiating [employer] system . Reviewed medications with patient and discussed importance of good medication  taking behavior . Patient encouraged to continue his exercise routine with his personal trainer . Positive  reinforcement given for his successful weight loss and consistency with personal training session . Informed patient the order for the w/c and the required documentation is in place and [employer] representatives have been notified . - A1C testing facilitated . - barriers to adherence to treatment plan identified . - dental care encouraged . - mutual A1C goal set or reviewed . - devices to improve adherence to care identified . - self-awareness of signs/symptoms of hypo or hyperglycemia encouraged . Discussed plans with patient for ongoing care management follow up and provided patient with direct contact information for care management team . Reviewed scheduled/upcoming provider appointments including: Aranesp injection on 3/17, for labs on 4/7 and with Dr Dwyane Dee on 4/14 . - A1C testing facilitated . - barriers to adherence to treatment plan identified . - dental care encouraged . - mutual A1C goal set or reviewed . - devices to improve adherence to care identified . - self-awareness of signs/symptoms of hypo or hyperglycemia encouraged . Discussed plans with patient for ongoing care management follow up and provided patient with direct contact information for care management team . Provided patient with written educational materials related to hypo and hyperglycemia and importance of correct treatment . In the next 30-60 days, patient will:  - Self administers oral medications as prescribed Self administers insulin as prescribed Self administers injectable DM medication Victoza and Tyler Aas as prescribed Attends all scheduled provider appointments Checks blood sugars as prescribed and utilize hyper and hypoglycemia protocol as needed  Adheres to prescribed ADA/carb modified Follow Up Plan: The care management team will reach out to the patient again over the next 30-60 days.       The  patient verbalized understanding of instructions, educational materials, and care plan provided today and declined offer to receive copy of patient instructions, educational materials, and care plan.     Kelli Churn RN, CCM, [facility] RN Care Manager [phone]

## 2020-03-18 NOTE — Progress Notes (Signed)
Internal Medicine Clinic Attending  CCM services provided by the care management provider and their documentation were discussed with Dr. Christian. We reviewed the pertinent findings, urgent action items addressed by the resident and non-urgent items to be addressed by the PCP.  I agree with the assessment, diagnosis, and plan of care documented in the CCM and resident's note.  Nischal Narendra, MD 03/18/2020  

## 2020-03-18 NOTE — Progress Notes (Signed)
Internal Medicine Clinic Resident  I have personally reviewed this encounter including the documentation in this note and/or discussed this patient with the care management provider. I will address any urgent items identified by the care management provider and will communicate my actions to the patient's PCP. I have reviewed the patient's CCM visit with my supervising attending, Dr Dareen Piano.  Mitzi Hansen, MD Internal Medicine Resident PGY-2 [facility] Pager: [phone] 03/18/2020 1:18 PM

## 2020-03-22 ENCOUNTER — Telehealth: Payer: Self-pay | Admitting: Internal Medicine

## 2020-03-22 NOTE — Telephone Encounter (Signed)
Please call the patient back in reference to his WC.  Pt states he going o be starting a [employer] Basketball program in May and he is now under 350 lbs and needs to discuss his current WC.

## 2020-03-22 NOTE — Telephone Encounter (Signed)
Following note from CCM on 03/16/2020:   ? Informed patient the order for the w/c and the required documentation is in place and [employer] representatives have been notified  Will route to CCM for f/u.

## 2020-03-23 ENCOUNTER — Encounter: Payer: Self-pay | Admitting: *Deleted

## 2020-03-23 NOTE — Progress Notes (Unsigned)

## 2020-03-24 ENCOUNTER — Encounter: Payer: Self-pay | Admitting: Internal Medicine

## 2020-03-24 ENCOUNTER — Telehealth: Payer: Self-pay | Admitting: Dietician

## 2020-03-24 NOTE — Progress Notes (Unsigned)
Things That May Be Affecting Your Health:  Alcohol  Hearing loss  Pain    Depression  Home Safety  Sexual Health  x Diabetes  Lack of physical activity  Stress   Difficulty with daily activities  Loneliness  Tiredness   Drug use  Medicines  Tobacco use  x Falls  Motor Vehicle Safety  Weight  x Food choices  Oral Health  Other    YOUR PERSONALIZED HEALTH PLAN : 1. Schedule your next subsequent Medicare Wellness visit in one year 2. Attend all of your regular appointments to address your medical issues 3. Complete the preventative screenings and services   Annual Wellness Visit   Medicare Covered Preventative Screenings and [place] Men and Women Who How Often Need? Date of Last Service Action  Abdominal Aortic Aneurysm Adults with AAA risk factors Once      Alcohol Misuse and Counseling All Adults Screening once a year if no alcohol misuse. Counseling up to 4 face to face sessions.     Bone Density Measurement  Adults at risk for osteoporosis Once every 2 yrs      Lipid Panel Z13.6 All adults without CV disease Once every 5 yrs       Colorectal Cancer   Stool sample or  Colonoscopy All adults 105 and older   Once every year  Every 10 years        Depression All Adults Once a year  Today   Diabetes Screening Blood glucose, post glucose load, or GTT Z13.1  All adults at risk  Pre-diabetics  Once per year  Twice per year x     Diabetes  Self-Management Training All adults Diabetics 10 hrs first year; 2 hours subsequent years. Requires Copay     Glaucoma  Diabetics  Family history of glaucoma  African Americans 33 yrs +  Hispanic Americans 55 yrs + Annually - requires coppay      Hepatitis C Z72.89 or F19.20  High Risk for HCV  Born between 1945 and 1965  Annually  Once x     HIV Z11.4 All adults based on risk  Annually btw ages 18 & 32 regardless of risk  Annually > 65 yrs if at increased risk      Lung Cancer Screening  Asymptomatic adults aged 91-77 with 30 pack yr history and current smoker OR quit within the last 15 yrs Annually Must have counseling and shared decision making documentation before first screen      Medical Nutrition Therapy Adults with   Diabetes  Renal disease  Kidney transplant within past 3 yrs 3 hours first year; 2 hours subsequent years     Obesity and Counseling All adults Screening once a year Counseling if BMI 30 or higher  Today   Tobacco Use Counseling Adults who use tobacco  Up to 8 visits in one year     Vaccines Z23  Hepatitis B  Influenza   Pneumonia  Adults   Once  Once every flu season  Two different vaccines separated by one year     Next Annual Wellness Visit People with Medicare Every year  Today     Services & Screenings Women Who How Often Need  Date of Last Service Action  Mammogram  Z12.31 Women over 42 One baseline ages 78-39. Annually ager 40 yrs+      Pap tests All women Annually if high risk. Every 2 yrs for normal risk women  Screening for cervical cancer with   Pap (Z01.419 nl or Z01.411abnl) &  HPV Z11.51 Women aged 17 to 25 Once every 5 yrs     Screening pelvic and breast exams All women Annually if high risk. Every 2 yrs for normal risk women     Sexually Transmitted Diseases  Chlamydia  Gonorrhea  Syphilis All at risk adults Annually for non pregnant females at increased risk         [place] Men Who How Ofter Need  Date of Last Service Action  Prostate Cancer - DRE & PSA Men over 50 Annually.  DRE might require a copay.        Sexually Transmitted Diseases  Syphilis All at risk adults Annually for men at increased risk      Health Maintenance List Health Maintenance  Topic Date Due  . Hepatitis C Screening  Never done  . COVID-19 Vaccine (1) Never done  . URINE MICROALBUMIN  02/15/2018  . FOOT EXAM  04/25/2018  . HEMOGLOBIN A1C  06/01/2020  . OPHTHALMOLOGY EXAM  07/16/2020  . TETANUS/TDAP   08/15/2027  . INFLUENZA VACCINE  Completed  . PNEUMOCOCCAL POLYSACCHARIDE VACCINE AGE 16-64 HIGH RISK  Completed  . HIV Screening  Completed  . HPV VACCINES  Aged Out

## 2020-03-24 NOTE — Telephone Encounter (Signed)
Called patient to support his use of Continuous glucose monitoring. He is using the [employer] flash CGM and the reader now. He is getting supplies form [facility].  He verbalizes correctly how to use the trend arrows. An appointment was set up for next Thursday to continue to support his use of Continuous glucose monitoring to self manage his diabetes.

## 2020-03-25 ENCOUNTER — Ambulatory Visit (HOSPITAL_COMMUNITY)
Admission: RE | Admit: 2020-03-25 | Discharge: 2020-03-25 | Disposition: A | Discharge status: Home / Self Care | Payer: Medicare Other | Source: Ambulatory Visit | Attending: Nephrology | Admitting: Nephrology

## 2020-03-25 ENCOUNTER — Other Ambulatory Visit: Payer: Self-pay

## 2020-03-25 VITALS — BP 134/67 | HR 87 | Temp 97.7°F | Resp 18

## 2020-03-25 DIAGNOSIS — N2889 Other specified disorders of kidney and ureter: Secondary | ICD-10-CM

## 2020-03-25 DIAGNOSIS — N185 Chronic kidney disease, stage 5: Secondary | ICD-10-CM | POA: Insufficient documentation

## 2020-03-25 DIAGNOSIS — D631 Anemia in chronic kidney disease: Secondary | ICD-10-CM | POA: Insufficient documentation

## 2020-03-25 DIAGNOSIS — N183 Chronic kidney disease, stage 3 unspecified: Secondary | ICD-10-CM

## 2020-03-25 LAB — RENAL FUNCTION PANEL
Albumin: 2.8 g/dL — ABNORMAL LOW (ref 3.5–5.0)
Anion gap: 9 (ref 5–15)
BUN: 63 mg/dL — ABNORMAL HIGH (ref 6–20)
CO2: 22 mmol/L (ref 22–32)
Calcium: 8.7 mg/dL — ABNORMAL LOW (ref 8.9–10.3)
Chloride: 102 mmol/L (ref 98–111)
Creatinine, Ser: 3.38 mg/dL — ABNORMAL HIGH (ref 0.61–1.24)
GFR, Estimated: 22 mL/min — ABNORMAL LOW (ref >60)
Glucose, Bld: 271 mg/dL — ABNORMAL HIGH (ref 70–99)
Phosphorus: 3.8 mg/dL (ref 2.5–4.6)
Potassium: 4.3 mmol/L (ref 3.5–5.1)
Sodium: 133 mmol/L — ABNORMAL LOW (ref 135–145)

## 2020-03-25 LAB — IRON AND TIBC
Iron: 26 ug/dL — ABNORMAL LOW (ref 45–182)
Saturation Ratios: 10 % — ABNORMAL LOW (ref 17.9–39.5)
TIBC: 248 ug/dL — ABNORMAL LOW (ref 250–450)
UIBC: 222 ug/dL

## 2020-03-25 LAB — FERRITIN: Ferritin: 525 ng/mL — ABNORMAL HIGH (ref 24–336)

## 2020-03-25 LAB — POCT HEMOGLOBIN-HEMACUE: Hemoglobin: 9.7 g/dL — ABNORMAL LOW (ref 13.0–17.0)

## 2020-03-25 MED ORDER — DARBEPOETIN ALFA 200 MCG/0.4ML IJ SOSY
PREFILLED_SYRINGE | INTRAMUSCULAR | Status: AC
  Administered 2020-03-25: 200 ug via SUBCUTANEOUS
  Filled 2020-03-25: qty 0.4

## 2020-03-25 MED ORDER — DARBEPOETIN ALFA 200 MCG/0.4ML IJ SOSY: 200.0000 ug | PREFILLED_SYRINGE | INTRAMUSCULAR | Status: DC

## 2020-03-26 ENCOUNTER — Telehealth: Payer: Self-pay

## 2020-03-26 NOTE — Telephone Encounter (Signed)
RTC, patient states he has not been feeling well all week.  He c/o of coughing, headache, SOB at rest, nausea, vomitting (X2).  States he feels like he has pneumonia, denies fever.  RN asked patient to check his BS, he states it is 117 and reports his BS's "have been stable this week". No openings in [facility] this morning.  RN instructed pt to present to ED or urgent care for evaluation, especially since he is reporting the SOB at rest.  He verbalized understanding and is in agreement. SChaplin, RN,BSN

## 2020-03-26 NOTE — Telephone Encounter (Signed)
Agreed, thank you

## 2020-03-26 NOTE — Telephone Encounter (Signed)
Pt states he is not feeling well today, requesting an appt to come in. Please call pt back.

## 2020-03-29 ENCOUNTER — Ambulatory Visit (INDEPENDENT_AMBULATORY_CARE_PROVIDER_SITE_OTHER): Payer: Medicare Other | Admitting: Internal Medicine

## 2020-03-29 ENCOUNTER — Other Ambulatory Visit: Payer: Self-pay

## 2020-03-29 ENCOUNTER — Encounter: Payer: Self-pay | Admitting: Internal Medicine

## 2020-03-29 VITALS — BP 173/83 | HR 86 | Temp 98.3°F | Ht 74.0 in

## 2020-03-29 DIAGNOSIS — H35033 Hypertensive retinopathy, bilateral: Secondary | ICD-10-CM | POA: Diagnosis not present

## 2020-03-29 DIAGNOSIS — J069 Acute upper respiratory infection, unspecified: Secondary | ICD-10-CM | POA: Diagnosis not present

## 2020-03-29 DIAGNOSIS — E113312 Type 2 diabetes mellitus with moderate nonproliferative diabetic retinopathy with macular edema, left eye: Secondary | ICD-10-CM | POA: Diagnosis not present

## 2020-03-29 DIAGNOSIS — H3581 Retinal edema: Secondary | ICD-10-CM | POA: Diagnosis not present

## 2020-03-29 DIAGNOSIS — E113313 Type 2 diabetes mellitus with moderate nonproliferative diabetic retinopathy with macular edema, bilateral: Secondary | ICD-10-CM | POA: Diagnosis not present

## 2020-03-29 DIAGNOSIS — H3582 Retinal ischemia: Secondary | ICD-10-CM | POA: Diagnosis not present

## 2020-03-29 DIAGNOSIS — E113311 Type 2 diabetes mellitus with moderate nonproliferative diabetic retinopathy with macular edema, right eye: Secondary | ICD-10-CM | POA: Diagnosis not present

## 2020-03-29 MED ORDER — FLUTICASONE PROPIONATE 50 MCG/ACT NA SUSP: 2.0000 | Freq: Every day | NASAL | 2 refills | Status: AC

## 2020-03-29 NOTE — Telephone Encounter (Signed)
Patient called back. States he is still having SHOB but it's better than it was. Tried going to the gym today but it was a "no go." First available appt given today with PCP at 2:15.

## 2020-03-29 NOTE — Patient Instructions (Signed)
To Mr. Harshberger,  It was a pleasure seeing you again this afternoon. Today we discussed your symptoms. Based on your physical examination you likely have a upper respiratory infection. Given your symptoms, we will start you on Flonase. Please spray 2 sprays into each nostril every morning. Additionally continue taking your Zyrtec as well, and I will have you follow up with me in one week's time. If you feel that your symptoms are worsening, or you start to notice high grade fevers, please come back for reevaluation. I will see you in one week.  [name], MD

## 2020-03-30 ENCOUNTER — Ambulatory Visit: Payer: Medicare Other | Admitting: *Deleted

## 2020-03-30 ENCOUNTER — Encounter: Payer: Self-pay | Admitting: Internal Medicine

## 2020-03-30 ENCOUNTER — Other Ambulatory Visit: Payer: Self-pay | Admitting: Endocrinology

## 2020-03-30 ENCOUNTER — Other Ambulatory Visit: Payer: Medicare Other

## 2020-03-30 DIAGNOSIS — E1165 Type 2 diabetes mellitus with hyperglycemia: Secondary | ICD-10-CM

## 2020-03-30 DIAGNOSIS — I10 Essential (primary) hypertension: Secondary | ICD-10-CM

## 2020-03-30 DIAGNOSIS — J069 Acute upper respiratory infection, unspecified: Secondary | ICD-10-CM | POA: Insufficient documentation

## 2020-03-30 DIAGNOSIS — E785 Hyperlipidemia, unspecified: Secondary | ICD-10-CM

## 2020-03-30 DIAGNOSIS — Z89611 Acquired absence of right leg above knee: Secondary | ICD-10-CM

## 2020-03-30 LAB — SARS-COV-2, NAA 2 DAY TAT

## 2020-03-30 LAB — NOVEL CORONAVIRUS, NAA: SARS-CoV-2, NAA: NOT DETECTED

## 2020-03-30 NOTE — Assessment & Plan Note (Addendum)
[name] presents to the [facility] for shortness of breath,non productive cough, nausea, and 2 episodes of emesis for a week in duration. Patient states that his symptoms are getting better, but he attempted to go to the gym to work out this morning, and was unable to.   He has not noticed any fevers, abdominal pain, orthopnea, constipation or diarrhea.   He does endorse nasal congestion, night time coughing and wheezing.   A/P:  Patient presents to the clinic with [facility] and nonproductive cough for a week. He has had associated nausea with 2 vomiting episodes as well. On physical examination today he does have some cobblestoning present, nasal congestion, and upper respiratory breath sounds.   There were no rales, rhonchi, or crackles appreciated on physical examination to indicate that he is having a HF exacerbation, he also refused weigh in today, but he has not noticed increased in weight gain.   He is not having associated dyspnea or associated fevers, and negative physical exam findings for pneumonia. His symptoms likely appear to be caused by a URI. Patient currently taking an zyrtec, will add on flonase. Patient is also unvaccinated against the Covid 19 virus, will check nasal swab today to r/o. Will treat supportively at this time. Will have follow up in 1-2 weeks for reevaluation.  - Continue Zyrtec - Ordered Flonase 2 sprays per nostril daily  - Advised on strict return precautions.  - At next follow please check weight.

## 2020-03-30 NOTE — Chronic Care Management (AMB) (Signed)
   03/30/2020  [name] 1975/06/04 [id number]  Patient called this CCM RN on business mobile, states [employer] delivered a w/c to him a couple of weeks ago without consulting with him prior to the delivery. Says the only call he received was when the delivery driver called to say he was on his way to deliver the w/c. He says he wanted a w/c that would enable him to play w/c basketball.   Plan:  Advised patient this CCM RN will message the [employer] (DME company) staff to reach out to him to discuss w/c preferences and coverage.  Kelli Churn RN, CCM, [facility] RN Care Manager [phone]

## 2020-03-30 NOTE — Progress Notes (Signed)
CC: SHOB, Cough   HPI:  [name] is a 45 y.o. M/F, with a PMH noted below, who presents to the clinic for one week of cough and SHOB. To see the management of their acute and chronic conditions, please see the A&P note under the Encounters tab.   Past Medical History:  Diagnosis Date  . Acute venous embolism and thrombosis of deep vessels of proximal lower extremity ([place]) 07/19/2011  . Anemia   . Anginal pain ([place])    pt denies  . Anxiety   . Chest pain, neg MI, normal coronaries by cath 02/18/2013   pt denies  . CHF (congestive heart failure) ([place])   . CKD (chronic kidney disease) stage 3, GFR 30-59 ml/min ([facility]) 02/19/2013  . Colon polyps   . Depression   . Diabetic ulcer of right foot ([place])   . DVT (deep venous thrombosis) ([place]) 09/2002   patient reports additional DVTs in '06 & '11 (unconfirmed)  . ED (erectile dysfunction)   . GERD (gastroesophageal reflux disease)   . History of blood transfusion    "related to OR" (10/31/2016)  . Hyperlipidemia 02/19/2013  . Hypertension   . Nausea & vomiting    "constant for the last couple weeks" (10/31/2016)  . Nephrotic syndrome   . Obesity    BMI 44, weight 346 pounds 01/30/14  . OSA (obstructive sleep apnea) 02/28/2018   Mild obstructive sleep apnea with an AHI of 9.8/h but severe during REM sleep with an AHI of 33.8/h.  Oxygen saturations dropped to 86% and there was moderate snoring  . Peripheral edema   . Pneumonia   . Prosthesis adjustment 08/17/2016  . Pulmonary embolism ([place]) 09/2002   treated with 6 months of warfarin  . Pyelonephritis 02/02/2018  . Type I diabetes mellitus ([place]) dx'd 2001  . Urinary retention    Review of Systems:   Review of Systems  Constitutional: Negative for chills, fever, malaise/fatigue and weight loss.  HENT: Positive for congestion. Negative for nosebleeds, sinus pain and sore throat.   Eyes: Negative for blurred vision, double vision and photophobia.  Respiratory: Positive for cough and  shortness of breath. Negative for hemoptysis, sputum production, wheezing and stridor.   Cardiovascular: Negative for chest pain, palpitations, orthopnea, claudication and leg swelling.  Gastrointestinal: Negative for abdominal pain, blood in stool, constipation, diarrhea, nausea and vomiting.  Neurological: Negative for dizziness and headaches.     Physical Exam:  Vitals:   03/29/20 1407  BP: (!) 173/83  Pulse: 86  Temp: 98.3 F (36.8 C)  TempSrc: Oral  SpO2: 96%  Height: 6\' 2"  (1.88 m)   Physical Exam Constitutional:      General: He is not in acute distress.    Appearance: He is obese. He is not ill-appearing, toxic-appearing or diaphoretic.  HENT:     Nose: Congestion present. No rhinorrhea.     Mouth/Throat:     Mouth: Mucous membranes are moist.     Pharynx: No oropharyngeal exudate or posterior oropharyngeal erythema.     Comments: Cobblestoning present Cardiovascular:     Rate and Rhythm: Normal rate and regular rhythm.     Pulses: Normal pulses.     Heart sounds: Normal heart sounds. No murmur heard. No friction rub. No gallop.   Pulmonary:     Effort: Pulmonary effort is normal. No respiratory distress.     Breath sounds: Normal breath sounds. No stridor. No wheezing, rhonchi or rales.     Comments: Upper respiratory  sounds appreciated.  Abdominal:     General: Bowel sounds are normal. There is no distension.     Tenderness: There is no abdominal tenderness. There is no guarding.  Musculoskeletal:        General: No swelling.     Left lower leg: No edema.  Skin:    General: Skin is warm and dry.  Neurological:     Mental Status: He is alert and oriented to person, place, and time.      Assessment & Plan:   See Encounters Tab for problem based charting.  Patient discussed with Dr. Philipp Ovens

## 2020-03-31 NOTE — Progress Notes (Signed)
Internal Medicine Clinic Attending ? ?Case discussed with Dr. Winters  At the time of the visit.  We reviewed the resident?s history and exam and pertinent patient test results.  I agree with the assessment, diagnosis, and plan of care documented in the resident?s note.  ?

## 2020-04-01 ENCOUNTER — Ambulatory Visit: Payer: Medicare Other | Admitting: Endocrinology

## 2020-04-01 ENCOUNTER — Ambulatory Visit: Payer: Medicare Other | Admitting: Dietician

## 2020-04-05 ENCOUNTER — Encounter: Payer: Medicare Other | Admitting: Internal Medicine

## 2020-04-05 NOTE — Progress Notes (Signed)
Internal Medicine Clinic Resident  I have personally reviewed this encounter including the documentation in this note and/or discussed this patient with the care management provider. I will address any urgent items identified by the care management provider and will communicate my actions to the patient's PCP. I have reviewed the patient's CCM visit with my supervising attending, Dr Heber [place].  Mitzi Hansen, MD Internal Medicine Resident PGY-2 [facility] Pager: [phone] 04/05/2020 11:50 AM

## 2020-04-08 ENCOUNTER — Other Ambulatory Visit: Payer: Self-pay | Admitting: Student

## 2020-04-09 ENCOUNTER — Other Ambulatory Visit: Payer: Self-pay | Admitting: Student

## 2020-04-09 ENCOUNTER — Other Ambulatory Visit: Payer: Self-pay | Admitting: Internal Medicine

## 2020-04-09 DIAGNOSIS — E669 Obesity, unspecified: Secondary | ICD-10-CM | POA: Diagnosis not present

## 2020-04-09 DIAGNOSIS — N2581 Secondary hyperparathyroidism of renal origin: Secondary | ICD-10-CM | POA: Diagnosis not present

## 2020-04-09 DIAGNOSIS — I129 Hypertensive chronic kidney disease with stage 1 through stage 4 chronic kidney disease, or unspecified chronic kidney disease: Secondary | ICD-10-CM | POA: Diagnosis not present

## 2020-04-09 DIAGNOSIS — N184 Chronic kidney disease, stage 4 (severe): Secondary | ICD-10-CM | POA: Diagnosis not present

## 2020-04-09 DIAGNOSIS — E876 Hypokalemia: Secondary | ICD-10-CM | POA: Diagnosis not present

## 2020-04-12 NOTE — Telephone Encounter (Signed)
Call to pharmacy Last filled 03/09/20 # 180 (58mth supply) Refill too soon-request denied

## 2020-04-15 ENCOUNTER — Other Ambulatory Visit: Payer: Self-pay

## 2020-04-15 ENCOUNTER — Other Ambulatory Visit (INDEPENDENT_AMBULATORY_CARE_PROVIDER_SITE_OTHER): Payer: Medicare Other

## 2020-04-15 DIAGNOSIS — Z794 Long term (current) use of insulin: Secondary | ICD-10-CM | POA: Diagnosis not present

## 2020-04-15 DIAGNOSIS — E1165 Type 2 diabetes mellitus with hyperglycemia: Secondary | ICD-10-CM | POA: Diagnosis not present

## 2020-04-15 LAB — BASIC METABOLIC PANEL
BUN: 56 mg/dL — ABNORMAL HIGH (ref 6–23)
CO2: 25 mEq/L (ref 19–32)
Calcium: 9.1 mg/dL (ref 8.4–10.5)
Chloride: 106 mEq/L (ref 96–112)
Creatinine, Ser: 2.66 mg/dL — ABNORMAL HIGH (ref 0.40–1.50)
GFR: 28.51 mL/min — ABNORMAL LOW (ref >60.00)
Glucose, Bld: 93 mg/dL (ref 70–99)
Potassium: 4.3 mEq/L (ref 3.5–5.1)
Sodium: 138 mEq/L (ref 135–145)

## 2020-04-15 LAB — HEMOGLOBIN A1C: Hgb A1c MFr Bld: 6 % (ref 4.6–6.5)

## 2020-04-16 ENCOUNTER — Telehealth: Payer: Medicare Other

## 2020-04-16 LAB — FRUCTOSAMINE: Fructosamine: 315 umol/L — ABNORMAL HIGH (ref 0–285)

## 2020-04-20 ENCOUNTER — Telehealth: Payer: Medicare Other

## 2020-04-22 ENCOUNTER — Other Ambulatory Visit: Payer: Self-pay

## 2020-04-22 ENCOUNTER — Ambulatory Visit (HOSPITAL_COMMUNITY)
Admission: RE | Admit: 2020-04-22 | Discharge: 2020-04-22 | Disposition: A | Discharge status: Home / Self Care | Payer: Medicare Other | Source: Ambulatory Visit | Attending: Nephrology | Admitting: Nephrology

## 2020-04-22 ENCOUNTER — Encounter: Payer: Self-pay | Admitting: Endocrinology

## 2020-04-22 ENCOUNTER — Ambulatory Visit (INDEPENDENT_AMBULATORY_CARE_PROVIDER_SITE_OTHER): Payer: Medicare Other | Admitting: Endocrinology

## 2020-04-22 VITALS — BP 168/82 | HR 95 | Ht 74.0 in | Wt 332.0 lb

## 2020-04-22 VITALS — BP 133/69 | HR 87 | Temp 97.6°F | Resp 18

## 2020-04-22 DIAGNOSIS — Z794 Long term (current) use of insulin: Secondary | ICD-10-CM | POA: Diagnosis not present

## 2020-04-22 DIAGNOSIS — N183 Chronic kidney disease, stage 3 unspecified: Secondary | ICD-10-CM | POA: Insufficient documentation

## 2020-04-22 DIAGNOSIS — E1165 Type 2 diabetes mellitus with hyperglycemia: Secondary | ICD-10-CM | POA: Diagnosis not present

## 2020-04-22 DIAGNOSIS — D631 Anemia in chronic kidney disease: Secondary | ICD-10-CM | POA: Diagnosis not present

## 2020-04-22 LAB — RENAL FUNCTION PANEL
Albumin: 3 g/dL — ABNORMAL LOW (ref 3.5–5.0)
Anion gap: 7 (ref 5–15)
BUN: 60 mg/dL — ABNORMAL HIGH (ref 6–20)
CO2: 23 mmol/L (ref 22–32)
Calcium: 9 mg/dL (ref 8.9–10.3)
Chloride: 105 mmol/L (ref 98–111)
Creatinine, Ser: 3.18 mg/dL — ABNORMAL HIGH (ref 0.61–1.24)
GFR, Estimated: 24 mL/min — ABNORMAL LOW (ref >60)
Glucose, Bld: 55 mg/dL — ABNORMAL LOW (ref 70–99)
Phosphorus: 3.9 mg/dL (ref 2.5–4.6)
Potassium: 4.3 mmol/L (ref 3.5–5.1)
Sodium: 135 mmol/L (ref 135–145)

## 2020-04-22 LAB — POCT HEMOGLOBIN-HEMACUE: Hemoglobin: 8.5 g/dL — ABNORMAL LOW (ref 13.0–17.0)

## 2020-04-22 MED ORDER — SODIUM CHLORIDE 0.9 % IV SOLN
510.0000 mg | INTRAVENOUS | Status: DC
  Administered 2020-04-22: 510 mg via INTRAVENOUS
  Filled 2020-04-22: qty 17

## 2020-04-22 MED ORDER — DARBEPOETIN ALFA 200 MCG/0.4ML IJ SOSY
PREFILLED_SYRINGE | INTRAMUSCULAR | Status: AC
  Filled 2020-04-22: qty 0.4

## 2020-04-22 MED ORDER — DARBEPOETIN ALFA 200 MCG/0.4ML IJ SOSY
200.0000 ug | PREFILLED_SYRINGE | INTRAMUSCULAR | Status: DC
  Administered 2020-04-22: 200 ug via SUBCUTANEOUS

## 2020-04-22 NOTE — Patient Instructions (Addendum)
Tresiba 54 units daily  No Novolog in am  15 Novolog at lunch and supper

## 2020-04-22 NOTE — Progress Notes (Signed)
Patient ID: [name], male   DOB: 1975/05/12, 45 y.o.   MRN: [id number]          Reason for Appointment: Follow-up for Type 2 Diabetes    History of Present Illness:          Date of diagnosis of type 2 diabetes mellitus:  2001      His weight is about 400 pounds at the time of diagnosis He does not know what his initial blood sugars and circumstances were but he apparently was started on insulin initially He was not able to tolerate metformin because of diarrhea Subsequently has been on insulin only, level of control not available but his A1c was significantly high at 12.8 in 2015  Background history:    Recent history:   INSULIN regimen is:   60 units Tresiba once daily   NovoLog 10 breakfast 15-20  units before lunch and dinner   Non-insulin hypoglycemic drugs the patient is taking are:  Victoza: 1.8 mg daily   Current management, blood sugar patterns and problems identified:   His A1c is 6% compared to 8% previously  Fructosamine is now 315, previously 378   Has finally been able to start the freestyle libre sensor  This appears to be relatively accurate compared to the lab blood sugars available on 2 occasions recently  Interpretation of his blood sugar patterns from his freestyle libre are as follows:  Blood sugars are on an average consistently within the target range  HIGHEST blood sugars on an average are from 8-10 PM and lowest 6-10 AM  Overnight blood sugars are somewhat variable but fairly good on an average and only once low normal  HYPOGLYCEMIA occurs occasionally after his breakfast related to exercise and also on 2 occasions before dinnertime  HYPERGLYCEMIA occurs variably in the afternoons or evenings with no consistent pattern; most significantly occurred in the afternoon and evening continuously on 4/5  [place]remeal blood sugars are usually near normal at dinnertime   He says he is cutting out carbohydrates at lunch and suppertime generally but  still takes 20 units NovoLog  Does not appear to have hypoglycemia from this and only when he is more active before dinnertime he may have low blood sugar  Also periodically is getting low blood sugars after his exercise in the morning when he takes 10 units despite eating half bagel  He says he is trying to exercise more vigorously now  Taking Victoza also  His [place] dose has not been changed for a while   Side effects from medications have been: Diarrhea with metformin, vomiting with Ozempic  Glucose monitoring:  Currently with freestyle libre version 2 with data as follows  CGM use % of time  94  2-week average/GV  147/39  Time in range     70   %  % Time Above 180  21  % Time above 250 5  % Time Below 70 4     PRE-MEAL Fasting Lunch Dinner Bedtime Overall  Glucose range:       Averages:  126  139  153   147 94   POST-MEAL PC Breakfast PC Lunch PC Dinner  Glucose range:     Averages:  124  165  180     Self-care:    Typical meal intake: Breakfast is egg whites, some oatmeal/grits and banana.  Usually small portions of meat or other protein at lunch and dinner and only a small amount of sweet potato at lunch  or carbohydrate              Dietician visit, most recent: Unknown               Weight history:  Wt Readings from Last 3 Encounters:  04/22/20 (!) 332 lb (150.6 kg)  03/04/20 (!) 322 lb (146.1 kg)  03/04/20 (!) 322 lb 14.4 oz (146.5 kg)    Glycemic control:   Lab Results  Component Value Date   HGBA1C 6.0 04/15/2020   HGBA1C 8.0 (A) 03/04/2020   HGBA1C 5.1 11/28/2019   Lab Results  Component Value Date   MICROALBUR 160.61 (H) 08/14/2013   LDLCALC 60 01/15/2020   CREATININE 3.18 (H) 04/22/2020   Lab Results  Component Value Date   MICRALBCREAT 1,528.4 (H) 02/15/2017    Lab Results  Component Value Date   FRUCTOSAMINE 315 (H) 04/15/2020   FRUCTOSAMINE 378 (H) 01/15/2020   FRUCTOSAMINE 263 12/04/2017   Lab Results  Component Value Date    HGB 8.5 (L) 04/22/2020      Allergies as of 04/22/2020      Reactions   Reglan [metoclopramide] Other (See Comments)   Dysphoric reaction   Metformin And Related Diarrhea   Ozempic (0.25 Or 0.5 Mg-dose) [semaglutide(0.25 Or 0.40m-dos)] Nausea And Vomiting      Medication List       Accurate as of April 22, 2020 11:59 PM. If you have any questions, ask your nurse or doctor.        Accu-Chek Guide Me w/Device Kit Use to check blood sugar up to 3 times a day What changed:   how much to take  how to take this  when to take this  additional instructions   Accu-Chek Guide test strip Generic drug: glucose blood Check blood sugar up to 3 times a day What changed:   how much to take  how to take this  when to take this  additional instructions   accu-chek softclix lancets Use to check blood sugars 5 times a day. Dx code:E11.9. Insulin dependent. What changed:   how much to take  how to take this  when to take this   Accu-Chek Softclix Lancets lancets Check blood sugar up to 3 times a day What changed:   how much to take  how to take this  when to take this  additional instructions   acidophilus Caps capsule Take 1 capsule by mouth daily.   Alpha-Lipoic Acid 600 MG Caps Take 600 mg by mouth daily.   amLODipine 10 MG tablet Commonly known as: NORVASC Take 10 mg by mouth at bedtime.   apixaban 5 MG Tabs tablet Commonly known as: Eliquis Take 1 tablet (5 mg total) by mouth 2 (two) times daily.   B-12 500 MCG Subl Place 500 mcg under the tongue daily.   B-D UF III MINI PEN NEEDLES 31G X 5 MM Misc Generic drug: Insulin Pen Needle 1 each by Other route See admin instructions. USE 4-5 TIMES DAILY   Pen Needles 3/16" 31G X 5 MM Misc Use five times daily   carvedilol 25 MG tablet Commonly known as: COREG Take 1 tablet (25 mg total) by mouth 2 (two) times daily with a meal.   famotidine 20 MG tablet Commonly known as: PEPCID Take 1 tablet  (20 mg total) by mouth 2 (two) times daily. What changed:   when to take this  reasons to take this   Fish Oil 1000 MG Caps Take 1,000 mg by  mouth at bedtime.   fluticasone 50 MCG/ACT nasal spray Commonly known as: Flonase Place 2 sprays into both nostrils daily.   furosemide 80 MG tablet Commonly known as: LASIX Take 1 tablet by mouth twice daily   gabapentin 100 MG capsule Commonly known as: NEURONTIN TAKE 1 CAPSULE BY MOUTH THREE TIMES DAILY   hydrALAZINE 50 MG tablet Commonly known as: APRESOLINE TAKE 1 TABLET BY MOUTH THREE TIMES DAILY   insulin aspart 100 UNIT/ML FlexPen Commonly known as: NOVOLOG Administer 5 units in the morning with breakfast, 20 units with lunch, and 25 units with dinner daily.  ICD Code:  E11.9   lovastatin 20 MG tablet Commonly known as: MEVACOR Take 1 tablet by mouth once daily   ondansetron 4 MG tablet Commonly known as: ZOFRAN Take 1 tablet (4 mg total) by mouth every 6 (six) hours as needed for nausea.   oxybutynin 5 MG tablet Commonly known as: DITROPAN Take 5 mg by mouth 3 (three) times daily.   polyethylene glycol 17 g packet Commonly known as: MIRALAX / GLYCOLAX Take 17 g by mouth 2 (two) times daily. What changed:   when to take this  reasons to take this   potassium chloride SA 20 MEQ tablet Commonly known as: KLOR-CON Take 20 mEq by mouth 2 (two) times daily.   senna-docusate 8.6-50 MG tablet Commonly known as: Senokot-S Take 1 tablet by mouth daily. What changed:   when to take this  reasons to take this   sildenafil 50 MG tablet Commonly known as: VIAGRA Take 50 mg by mouth as needed for erectile dysfunction.   tamsulosin 0.4 MG Caps capsule Commonly known as: FLOMAX Take 0.4 mg by mouth at bedtime.   Tyler Aas FlexTouch 200 UNIT/ML FlexTouch Pen Generic drug: insulin degludec INJECT 45 UNITS SUBCUTANEOUSLY ONCE DAILY   Victoza 18 MG/3ML Sopn Generic drug: liraglutide INJECT 1.2 MG INTO THE SKIN ONCE  DAILY AT THE SAME TIME.       Allergies:  Allergies  Allergen Reactions  . Reglan [Metoclopramide] Other (See Comments)    Dysphoric reaction  . Metformin And Related Diarrhea  . Ozempic (0.25 Or 0.5 Mg-Dose) [Semaglutide(0.25 Or 0.20m-Dos)] Nausea And Vomiting    Past Medical History:  Diagnosis Date  . Acute venous embolism and thrombosis of deep vessels of proximal lower extremity ([facility] 07/19/2011  . Anemia   . Anginal pain ([facility]    pt denies  . Anxiety   . Chest pain, neg MI, normal coronaries by cath 02/18/2013   pt denies  . CHF (congestive heart failure) ([facility]   . CKD (chronic kidney disease) stage 3, GFR 30-59 ml/min (HCC) 02/19/2013  . Colon polyps   . Depression   . Diabetic ulcer of right foot ([facility]   . DVT (deep venous thrombosis) ([facility] 09/2002   patient reports additional DVTs in '06 & '11 (unconfirmed)  . ED (erectile dysfunction)   . GERD (gastroesophageal reflux disease)   . History of blood transfusion    "related to OR" (10/31/2016)  . Hyperlipidemia 02/19/2013  . Hypertension   . Nausea & vomiting    "constant for the last couple weeks" (10/31/2016)  . Nephrotic syndrome   . Obesity    BMI 44, weight 346 pounds 01/30/14  . OSA (obstructive sleep apnea) 02/28/2018   Mild obstructive sleep apnea with an AHI of 9.8/h but severe during REM sleep with an AHI of 33.8/h.  Oxygen saturations dropped to 86% and there was moderate snoring  . Peripheral  edema   . Pneumonia   . Prosthesis adjustment 08/17/2016  . Pulmonary embolism ([place]) 09/2002   treated with 6 months of warfarin  . Pyelonephritis 02/02/2018  . Type I diabetes mellitus ([place]) dx'd 2001  . Urinary retention     Past Surgical History:  Procedure Laterality Date  . AMPUTATION Right 12/22/2013   Procedure: AMPUTATION BELOW KNEE;  Surgeon: [name], MD;  Location: [facility];  Service: Orthopedics;  Laterality: Right;  . AMPUTATION Right 02/19/2014   Procedure: RIGHT ABOVE KNEE AMPUTATION ;  Surgeon:  [name], MD;  Location: [facility];  Service: Orthopedics;  Laterality: Right;  . blood clot removal  2016  . CARDIAC CATHETERIZATION  02/18/2013   normal coronaries  . ESOPHAGOGASTRODUODENOSCOPY N/A 11/02/2016   Procedure: ESOPHAGOGASTRODUODENOSCOPY (EGD);  Surgeon: [name], MD;  Location: [facility] ENDOSCOPY;  Service: Endoscopy;  Laterality: N/A;  . I & D EXTREMITY Right 12/14/2013   Procedure: IRRIGATION AND DEBRIDEMENT RIGHT FOOT;  Surgeon: [name], MD;  Location: [facility];  Service: Orthopedics;  Laterality: Right;  . INCISION AND DRAINAGE ABSCESS  2007; 2015   "back"  . INCISION AND DRAINAGE OF WOUND Right 12/22/2013   Procedure: I&D RIGHT BUTTOCK;  Surgeon: [name], MD;  Location: [facility];  Service: Orthopedics;  Laterality: Right;  . LEFT HEART CATHETERIZATION WITH CORONARY ANGIOGRAM N/A 02/18/2013   Procedure: LEFT HEART CATHETERIZATION WITH CORONARY ANGIOGRAM;  Surgeon: [name], MD;  Location: [facility] CATH LAB;  Service: Cardiovascular;  Laterality: N/A;    Family History  Problem Relation Age of Onset  . Heart disease Mother   . Diabetes Mother   . Diabetes Maternal Uncle   . Diabetes Cousin     Social History:  reports that he has never smoked. He has never used smokeless tobacco. He reports current alcohol use. He reports that he does not use drugs.   Review of Systems    Lipid history: Has been on lovastatin 20 mg with labs as follows: Prescribed by PCP   Lab Results  Component Value Date   CHOL 118 01/15/2020   HDL 30.60 (L) 01/15/2020   LDLCALC 60 01/15/2020   TRIG 140.0 01/15/2020   CHOLHDL 4 01/15/2020           Hypertension: He is taking clonidine, Norvasc, hydralazine and Coreg followed by nephrologist  BP Readings from Last 3 Encounters:  04/22/20 133/69  04/22/20 (!) 168/82  03/29/20 (!) 173/83     Most recent eye exam was in 91/6384   Complications of diabetes: Nephropathy with renal insufficiency and proteinuria, unknown status of  retinopathy, erectile dysfunction  Followed by nephrologist for CKD Also on ESA as well as iron infusions for anemia  Lab Results  Component Value Date   CREATININE 3.18 (H) 04/22/2020   CREATININE 2.66 (H) 04/15/2020   CREATININE 3.38 (H) 03/25/2020    Lab Results  Component Value Date   HGB 8.5 (L) 04/22/2020      LABS:  Hospital Outpatient Visit on 04/22/2020  Component Date Value Ref Range Status  . Sodium 04/22/2020 135  135 - 145 mmol/L Final  . Potassium 04/22/2020 4.3  3.5 - 5.1 mmol/L Final  . Chloride 04/22/2020 105  98 - 111 mmol/L Final  . CO2 04/22/2020 23  22 - 32 mmol/L Final  . Glucose, Bld 04/22/2020 55* 70 - 99 mg/dL Final   Glucose reference range applies only to samples taken after fasting for at least 8 hours.  Marland Kitchen  BUN 04/22/2020 60* 6 - 20 mg/dL Final  . Creatinine, Ser 04/22/2020 3.18* 0.61 - 1.24 mg/dL Final  . Calcium 04/22/2020 9.0  8.9 - 10.3 mg/dL Final  . Phosphorus 04/22/2020 3.9  2.5 - 4.6 mg/dL Final  . Albumin 04/22/2020 3.0* 3.5 - 5.0 g/dL Final  . GFR, Estimated 04/22/2020 24* >60 mL/min Final   Comment: (NOTE) Calculated using the CKD-EPI Creatinine Equation (2021)   . Anion gap 04/22/2020 7  5 - 15 Final   Performed at [facility] [address], [place], [place] [address]  . PTH 04/22/2020 62  15 - 65 pg/mL Final  . Calcium, Total (PTH) 04/22/2020 8.7  8.7 - 10.2 mg/dL Final  . PTH Interp 04/22/2020 Comment   Final   Comment: (NOTE) Interpretation                 Intact PTH    Calcium                                (pg/mL)      (mg/dL) Normal                          15 - 65     8.6 - 10.2 Primary Hyperparathyroidism         >65          >10.2 Secondary Hyperparathyroidism       >65          <10.2 Non-Parathyroid Hypercalcemia       <65          >10.2 Hypoparathyroidism                  <15          < 8.6 Non-Parathyroid Hypocalcemia    15 - 65          < 8.6 Performed At: [facility] [place], [place] [id number] Rush Farmer MD [id number]   . Hemoglobin 04/22/2020 8.5* 13.0 - 17.0 g/dL Final    Physical Examination:  BP (!) 168/82   Pulse 95   Ht 6' 2"  (1.88 m)   Wt (!) 332 lb (150.6 kg)   SpO2 98%   BMI 42.63 kg/m       ASSESSMENT:  Diabetes type 2, with morbid obesity  See history of present illness for detailed discussion of current diabetes management, blood sugar patterns and problems identified  His last A1c is 6% and fructosamine is improved  Is on basal bolus insulin and Victoza  Blood sugar patterns were evaluated in detail by freestyle libre which appears to be accurate compared to lab glucose He is able to have generally better control with the help of the freestyle libre although not always able to prevent hypoglycemia Most of his hyperglycemia is related to exercise in the mornings and occasionally before dinnertime With his being more active he may be also getting low normal sugars overnight at times also  Currently with low carbohydrate intake at lunch and dinner he is not having significant hyperglycemia but on the other hand not getting his low sugars even with taking 20 units Likely does not need any mealtime coverage at breakfast when he is doing vigorous exercise  Severe anemia: Followed by nephrology    PLAN:   He will stay on 1.8 mg Victoza Tresiba 54 units and adjust based  on fasting blood sugar patterns Try taking only 15 units at lunch and dinner unless eating more carbohydrate Skip NovoLog at breakfast if exercising May take NovoLog right after eating if blood sugars are low normal especially if eating carbohydrate Discussed blood sugar targets If he is planning to be active during the day and blood sugars are trending down he will need to have a simple sugar drink Discussed hyperglycemic episodes also and adjustment of mealtime dose Periodically check home fingerstick readings to compare with libre  Follow-up in 12  weeks   Patient Instructions  Tresiba 54 units daily  No Novolog in am  15 Novolog at lunch and supper    Total visit time including counseling = 30 minutes    [name] 04/23/2020, 9:47 AM   Note: This office note was prepared with Dragon voice recognition system technology. Any transcriptional errors that result from this process are unintentional.

## 2020-04-23 LAB — PTH, INTACT AND CALCIUM
Calcium, Total (PTH): 8.7 mg/dL (ref 8.7–10.2)
PTH: 62 pg/mL (ref 15–65)

## 2020-04-26 ENCOUNTER — Ambulatory Visit: Payer: Medicare Other | Admitting: *Deleted

## 2020-04-26 DIAGNOSIS — E1165 Type 2 diabetes mellitus with hyperglycemia: Secondary | ICD-10-CM

## 2020-04-26 DIAGNOSIS — I10 Essential (primary) hypertension: Secondary | ICD-10-CM

## 2020-04-26 DIAGNOSIS — Z794 Long term (current) use of insulin: Secondary | ICD-10-CM

## 2020-04-26 DIAGNOSIS — E785 Hyperlipidemia, unspecified: Secondary | ICD-10-CM

## 2020-04-26 DIAGNOSIS — Z89611 Acquired absence of right leg above knee: Secondary | ICD-10-CM

## 2020-04-26 NOTE — Chronic Care Management (AMB) (Signed)
Care Management    RN Visit Note  04/26/2020 Name: [name] MRN: [id number] DOB: May 02, 1975  Subjective: [name] is a 45 y.o. year old male who is a primary care patient of [name], MD. The care management team was consulted for assistance with disease management and care coordination needs.    Engaged with patient by telephone for follow up visit in response to provider referral for case management and/or care coordination services.   Consent to Services:   [name] was given information about Care Management services today including:  1. Care Management services includes personalized support from designated clinical staff supervised by his physician, including individualized plan of care and coordination with other care providers 2. 24/7 contact phone numbers for assistance for urgent and routine care needs. 3. The patient may stop case management services at any time by phone call to the office staff.  Patient agreed to services and consent obtained.   Assessment: Review of patient past medical history, allergies, medications, health status, including review of consultants reports, laboratory and other test data, was performed as part of comprehensive evaluation and provision of chronic care management services.   SDOH (Social Determinants of Health) assessments and interventions performed:    Care Plan  Allergies  Allergen Reactions  . Reglan [Metoclopramide] Other (See Comments)    Dysphoric reaction  . Metformin And Related Diarrhea  . Ozempic (0.25 Or 0.5 Mg-Dose) [Semaglutide(0.25 Or 0.69m-Dos)] Nausea And Vomiting    Outpatient Encounter Medications as of 04/26/2020  Medication Sig Note  . Accu-Chek Softclix Lancets lancets Check blood sugar up to 3 times a day (Patient taking differently: 1 each by Other route See admin instructions. Check blood sugar up to 4-5 times a day)   . acidophilus (RISAQUAD) CAPS capsule Take 1 capsule by mouth daily.   .  Alpha-Lipoic Acid 600 MG CAPS Take 600 mg by mouth daily.   .Marland KitchenamLODipine (NORVASC) 10 MG tablet Take 10 mg by mouth at bedtime.   .Marland Kitchenapixaban (ELIQUIS) 5 MG TABS tablet Take 1 tablet (5 mg total) by mouth 2 (two) times daily.   . Blood Glucose Monitoring Suppl (ACCU-CHEK GUIDE ME) w/Device KIT Use to check blood sugar up to 3 times a day (Patient taking differently: 1 each by Other route See admin instructions. Use to check blood sugar up to 4-5 times a day)   . carvedilol (COREG) 25 MG tablet Take 1 tablet (25 mg total) by mouth 2 (two) times daily with a meal.   . Cyanocobalamin (B-12) 500 MCG SUBL Place 500 mcg under the tongue daily.    . famotidine (PEPCID) 20 MG tablet Take 1 tablet (20 mg total) by mouth 2 (two) times daily. (Patient taking differently: Take 20 mg by mouth as needed for heartburn or indigestion.) 11/28/2019: .  . fluticasone (FLONASE) 50 MCG/ACT nasal spray Place 2 sprays into both nostrils daily.   . furosemide (LASIX) 80 MG tablet Take 1 tablet by mouth twice daily 02/17/2020: Pt states he needs refills  . gabapentin (NEURONTIN) 100 MG capsule TAKE 1 CAPSULE BY MOUTH THREE TIMES DAILY   . glucose blood (ACCU-CHEK GUIDE) test strip Check blood sugar up to 3 times a day (Patient taking differently: 1 each by Other route See admin instructions. Check blood sugar up to 4-5 times a day)   . hydrALAZINE (APRESOLINE) 50 MG tablet TAKE 1 TABLET BY MOUTH THREE TIMES DAILY   . insulin aspart (NOVOLOG) 100 UNIT/ML FlexPen Administer 5 units  in the morning with breakfast, 20 units with lunch, and 25 units with dinner daily.  ICD Code:  E11.9   . insulin degludec (TRESIBA FLEXTOUCH) 200 UNIT/ML FlexTouch Pen INJECT 45 UNITS SUBCUTANEOUSLY ONCE DAILY   . Insulin Pen Needle (B-D UF III MINI PEN NEEDLES) 31G X 5 MM MISC 1 each by Other route See admin instructions. USE 4-5 TIMES DAILY   . Insulin Pen Needle (PEN NEEDLES 3/16") 31G X 5 MM MISC Use five times daily   . Lancet Devices (ACCU-CHEK  SOFTCLIX) lancets Use to check blood sugars 5 times a day. Dx code:E11.9. Insulin dependent. (Patient taking differently: 1 each by Other route See admin instructions. Use to check blood sugars 5 times a day. Dx code:E11.9. Insulin dependent.)   . lovastatin (MEVACOR) 20 MG tablet Take 1 tablet by mouth once daily   . Omega-3 Fatty Acids (FISH OIL) 1000 MG CAPS Take 1,000 mg by mouth at bedtime.    . ondansetron (ZOFRAN) 4 MG tablet Take 1 tablet (4 mg total) by mouth every 6 (six) hours as needed for nausea.   Marland Kitchen oxybutynin (DITROPAN) 5 MG tablet Take 5 mg by mouth 3 (three) times daily.    . polyethylene glycol (MIRALAX / GLYCOLAX) 17 g packet Take 17 g by mouth 2 (two) times daily. (Patient taking differently: Take 17 g by mouth as needed for mild constipation.)   . potassium chloride SA (KLOR-CON) 20 MEQ tablet Take 20 mEq by mouth 2 (two) times daily.   Marland Kitchen senna-docusate (SENOKOT-S) 8.6-50 MG tablet Take 1 tablet by mouth daily. (Patient taking differently: Take 1 tablet by mouth as needed for mild constipation.)   . sildenafil (VIAGRA) 50 MG tablet Take 50 mg by mouth as needed for erectile dysfunction.   . tamsulosin (FLOMAX) 0.4 MG CAPS capsule Take 0.4 mg by mouth at bedtime.    Marland Kitchen VICTOZA 18 MG/3ML SOPN INJECT 1.2 MG INTO THE SKIN ONCE DAILY AT THE SAME TIME.    No facility-administered encounter medications on file as of 04/26/2020.    Patient Active Problem List   Diagnosis Date Noted  . URI, acute 03/30/2020  . Body aches 03/04/2020  . Hypoglycemia 11/28/2019  . Abdominal pain 08/29/2019  . Anemia associated with chronic renal failure   . CKD (chronic kidney disease)   . Peripheral edema   . Hypervolemia associated with renal insufficiency 07/30/2019  . Acute renal failure with acute tubular necrosis superimposed on stage 4 chronic kidney disease ([place])   . Acute pain of left knee 01/01/2019  . Hemoglobinuria 12/12/2018  . Hematoma of left lower leg 12/12/2018  . Urinary tract  infection 09/04/2018  . Exposure to SARS-associated coronavirus 07/25/2018  . Headache 06/18/2018  . Leg swelling   . Cellulitis 04/12/2018  . Pain of left lower extremity 03/20/2018  . OSA (obstructive sleep apnea) 02/28/2018  . Chronic diastolic heart failure ([place]) 02/02/2018  . Healthcare maintenance 08/15/2017  . Erectile dysfunction 04/22/2017  . History of pulmonary embolus (PE) 06/09/2016  . Anxiety 05/04/2016  . Depression 05/04/2016  . Hypogonadism in male 05/04/2016  . S/P AKA (above knee amputation) unilateral, right ([place]) 05/04/2016  . Urinary retention 05/04/2016  . Anemia of chronic disease 05/04/2016  . Morbid obesity with BMI of 40.0-44.9, adult ([place]) 10/14/2015  . Dyslipidemia 08/12/2014  . Pedal edema 08/12/2014  . Uncontrolled type 2 diabetes mellitus with hyperglycemia, with long-term current use of insulin ([place]) 08/12/2014  . Constipation   . Unilateral AKA, right (  [place]) 12/26/2013  . Hypoglycemic event in diabetes ([place]) 12/17/2013  . Hyperlipidemia 02/19/2013  . S/P cardiac cath, 02/18/13, normal coronaries 02/19/2013  . Chronic renal insufficiency, stage 3 (moderate) ([place]) 02/19/2013  . Hypertension 07/24/2011  . Long term current use of anticoagulant therapy 07/19/2011  . History of DVT (deep vein thrombosis) 07/28/2009  . Morbid obesity ([place]) 10/26/2005  . Insulin dependent type 2 diabetes mellitus, controlled ([place]) 01/09/2002    Conditions to be addressed/monitored:  IDDM, HF, HLD, HTN, CKD, s/p R AKA 02/20/14  Care Plan : Diabetes Type 2 (Adult)  Updates made by Barrington Ellison, RN since 04/26/2020 12:00 AM    Problem: Currently meeting A1C target but having large fluctuations in blood sugars   Priority: High  Onset Date: 12/15/2019    Goal: Glycemic Management Optimized without large fluctuations in blood sugars   Start Date: 12/15/2019  Expected End Date: 07/08/2020  Recent Progress: On track  Priority: High  Note:   Objective:  . Lab Results  .  Component . Value . Date .   Marland Kitchen HGBA1C . 8.0 (A) . 03/04/2020 .    Marland Kitchen Lab Results .  Component . Value . Date .   Marland Kitchen CREATININE . 2.93 (H) . 02/27/2020 .    Marland Kitchen No results found for: EGFR Current Barriers:  Marland Kitchen Knowledge Deficits related to basic Diabetes pathophysiology and self care/management . Unable to independently meet pre and post meal CBG targets - successful outreach to patient. He states he continues to wear the Freestyle Libre flash glucose monitoring system and he loves it, he says the majority of his blood sugars are in range, says he saw Dr Dwyane Dee on 4/14 and adjustment were made to his DM medications, he says he did not receive a call from [place] about his request to replace the w/c that was delivered with one that will allow him to play w/c basketball  Case Manager Clinical Goal(s):  Marland Kitchen Over the next 30-60 days, patient will demonstrate improved adherence to prescribed treatment plan for diabetes self care/management as evidenced by:  . daily monitoring and recording of CBG  . adherence to ADA/ carb modified diet . exercise 5-7 days/week- adherence to prescribed medication regimen Interventions:  . Review of patient status, including review of consultants reports, relevant laboratory and other test results, and medications completed. . Provided education to patient about basic DM disease process and negative impact of highly fluctuating CBGs on all body systems . Assessed satisfaction with Freestyle Libre Flash Glucose monitor . Assessed frequency of interstitial fluid glucose readings  being in target . Discussed relationship of interstitial fluid glucose and capillary glucose . Reviewed Dr. Ronnie Derby office note of 4/14 in detail with patient including changes to DM medication . Reviewed medications with patient and discussed importance of good medication taking behavior . Messaged [employer] representatives again regarding patient's 2nd request for call from [employer] to discuss w/c  preferences . Patient encouraged to continue his exercise routine with his personal trainer . Positive  reinforcement given for his successful weight loss and consistency with personal training session . Informed patient the order for the w/c and the required documentation is in place and [employer] representatives have been notified . - A1C testing facilitated . - barriers to adherence to treatment plan identified . - dental care encouraged . - mutual A1C goal set or reviewed . - devices to improve adherence to care identified . - self-awareness of signs/symptoms of hypo or hyperglycemia encouraged . Discussed plans  with patient for ongoing care management follow up and provided patient with direct contact information for care management team . Reviewed scheduled/upcoming provider appointments including: Aranesp injection on 3/17, for labs on 4/7 and with Dr Dwyane Dee on 4/14 . - A1C testing facilitated . - barriers to adherence to treatment plan identified . - dental care encouraged . - mutual A1C goal set or reviewed . - devices to improve adherence to care identified . - self-awareness of signs/symptoms of hypo or hyperglycemia encouraged . Discussed plans with patient for ongoing care management follow up and provided patient with direct contact information for care management team . In the next 30-60 days, patient will:  - Self administers oral medications as prescribed Self administers insulin as prescribed Self administers injectable DM medication Victoza and Tyler Aas as prescribed Attends all scheduled provider appointments Checks blood sugars as prescribed and utilize hyper and hypoglycemia protocol as needed Adheres to prescribed ADA/carb modified Follow Up Plan: The care management team will reach out to the patient again over the next 30-60 days.       Kelli Churn RN, CCM, [facility] RN Care Manager [phone]

## 2020-04-26 NOTE — Patient Instructions (Signed)
Visit Information It was nice speaking with you today. Goals Addressed            This Visit's Progress   . Monitor and Manage My Blood Sugar-Diabetes Type 2       Timeframe:  Long-Range Goal Priority:  High Start Date:       12/15/19                      Expected End Date:   ongoing            - check blood sugar at prescribed times - check blood sugar before and after exercise - check blood sugar if I feel it is too high or too low - enter blood sugar readings and medication or insulin into daily log - take the blood sugar meter to all doctor visits    04/26/20- patient continues to wear Freestyle Libre flash glucose monitoring system and "loves it" , time in range = 70% per Dr Ronnie Derby note of 4/14       The patient verbalized understanding of instructions, educational materials, and care plan provided today and declined offer to receive copy of patient instructions, educational materials, and care plan.   The care management team will reach out to the patient again over the next 30-60 days.   Kelli Churn RN, CCM, [facility] RN Care Manager [phone]

## 2020-04-29 ENCOUNTER — Other Ambulatory Visit: Payer: Self-pay

## 2020-04-29 ENCOUNTER — Encounter (HOSPITAL_COMMUNITY)
Admission: RE | Admit: 2020-04-29 | Discharge: 2020-04-29 | Disposition: A | Discharge status: Home / Self Care | Payer: Medicare Other | Source: Ambulatory Visit | Attending: Nephrology | Admitting: Nephrology

## 2020-04-29 DIAGNOSIS — N183 Chronic kidney disease, stage 3 unspecified: Secondary | ICD-10-CM | POA: Insufficient documentation

## 2020-04-29 DIAGNOSIS — D631 Anemia in chronic kidney disease: Secondary | ICD-10-CM | POA: Diagnosis not present

## 2020-04-29 MED ORDER — SODIUM CHLORIDE 0.9 % IV SOLN
510.0000 mg | INTRAVENOUS | Status: AC
  Administered 2020-04-29: 510 mg via INTRAVENOUS
  Filled 2020-04-29: qty 510

## 2020-05-03 ENCOUNTER — Ambulatory Visit: Payer: Medicare Other | Admitting: *Deleted

## 2020-05-03 DIAGNOSIS — E785 Hyperlipidemia, unspecified: Secondary | ICD-10-CM

## 2020-05-03 DIAGNOSIS — Z89611 Acquired absence of right leg above knee: Secondary | ICD-10-CM

## 2020-05-03 DIAGNOSIS — E1165 Type 2 diabetes mellitus with hyperglycemia: Secondary | ICD-10-CM

## 2020-05-03 DIAGNOSIS — I10 Essential (primary) hypertension: Secondary | ICD-10-CM

## 2020-05-03 NOTE — Chronic Care Management (AMB) (Signed)
  Care Management   Note  05/03/2020 Name: Bronx Brogden MRN: [id number] DOB: 1975/04/18  [name] is enrolled in a Managed Medicaid plan: No. Outreach attempt today was successful.   Returned call to patient, he is voicing frustration about the inability to secure a w/c that will allow him to play basketball. This CCM RN advised him that [employer] representative Beverly Samples on 4/19 sent the following message to this CCM RN "unfortunately, for what he is wanting we don't provide those, and he wouldn't qualify for a lighter weight wheelchair due to his weight. i'm not sure who would carry those or if insurance would cover". When this information was shared with patient he stated he knows the weight limit for the w/c he is requesting is <350 lbs and he meets that weight criteria.  Patient requested this CCM RN contact Dr Jamey Reas PT with [facility] on [address] as she was the one who spoke to the patient about the w/c and according to Mr Fetterman spoke with someone at [facility] who said they did have the w/c he is requesting. Emailed Dr. Jone Baseman per patient request and await reply.  The care management team will reach out to the patient again over the next 30-60 days.   Kelli Churn RN, CCM, [facility] RN Care Manager [phone]

## 2020-05-06 DIAGNOSIS — E113211 Type 2 diabetes mellitus with mild nonproliferative diabetic retinopathy with macular edema, right eye: Secondary | ICD-10-CM | POA: Diagnosis not present

## 2020-05-10 ENCOUNTER — Ambulatory Visit: Payer: Medicare Other | Admitting: *Deleted

## 2020-05-10 DIAGNOSIS — E785 Hyperlipidemia, unspecified: Secondary | ICD-10-CM

## 2020-05-10 DIAGNOSIS — E1165 Type 2 diabetes mellitus with hyperglycemia: Secondary | ICD-10-CM

## 2020-05-10 DIAGNOSIS — I10 Essential (primary) hypertension: Secondary | ICD-10-CM

## 2020-05-10 DIAGNOSIS — Z89611 Acquired absence of right leg above knee: Secondary | ICD-10-CM

## 2020-05-10 NOTE — Chronic Care Management (AMB) (Signed)
Care Management    RN Visit Note  05/10/2020 Name: [name] MRN: [id number] DOB: [birth year]  Subjective: [name] is a 45 y.o. year old male who is a primary care patient of Victor Mercury, MD. The care management team was consulted for assistance with disease management and care coordination needs.    Consent to Services:   [name] was given information about Care Management services today including:  1. Care Management services includes personalized support from designated clinical staff supervised by his physician, including individualized plan of care and coordination with other care providers 2. 24/7 contact phone numbers for assistance for urgent and routine care needs. 3. The patient may stop case management services at any time by phone call to the office staff.  Patient agreed to services and consent obtained.   Assessment: Review of patient past medical history, allergies, medications, health status, including review of consultants reports, laboratory and other test data, was performed as part of comprehensive evaluation and provision of chronic care management services.   SDOH (Social Determinants of Health) assessments and interventions performed:    Care Plan  Allergies  Allergen Reactions  . Reglan [Metoclopramide] Other (See Comments)    Dysphoric reaction  . Metformin And Related Diarrhea  . Ozempic (0.25 Or 0.5 Mg-Dose) [Semaglutide(0.25 Or 0.101m-Dos)] Nausea And Vomiting    Outpatient Encounter Medications as of 05/10/2020  Medication Sig Note  . Accu-Chek Softclix Lancets lancets Check blood sugar up to 3 times a day (Patient taking differently: 1 each by Other route See admin instructions. Check blood sugar up to 4-5 times a day)   . acidophilus (RISAQUAD) CAPS capsule Take 1 capsule by mouth daily.   . Alpha-Lipoic Acid 600 MG CAPS Take 600 mg by mouth daily.   .Marland KitchenamLODipine (NORVASC) 10 MG tablet Take 10 mg by mouth at bedtime.   .Marland Kitchenapixaban  (ELIQUIS) 5 MG TABS tablet Take 1 tablet (5 mg total) by mouth 2 (two) times daily.   . Blood Glucose Monitoring Suppl (ACCU-CHEK GUIDE ME) w/Device KIT Use to check blood sugar up to 3 times a day (Patient taking differently: 1 each by Other route See admin instructions. Use to check blood sugar up to 4-5 times a day)   . carvedilol (COREG) 25 MG tablet Take 1 tablet (25 mg total) by mouth 2 (two) times daily with a meal.   . Cyanocobalamin (B-12) 500 MCG SUBL Place 500 mcg under the tongue daily.    . famotidine (PEPCID) 20 MG tablet Take 1 tablet (20 mg total) by mouth 2 (two) times daily. (Patient taking differently: Take 20 mg by mouth as needed for heartburn or indigestion.) 11/28/2019: .  . fluticasone (FLONASE) 50 MCG/ACT nasal spray Place 2 sprays into both nostrils daily.   . furosemide (LASIX) 80 MG tablet Take 1 tablet by mouth twice daily 02/17/2020: Pt states he needs refills  . gabapentin (NEURONTIN) 100 MG capsule TAKE 1 CAPSULE BY MOUTH THREE TIMES DAILY   . glucose blood (ACCU-CHEK GUIDE) test strip Check blood sugar up to 3 times a day (Patient taking differently: 1 each by Other route See admin instructions. Check blood sugar up to 4-5 times a day)   . hydrALAZINE (APRESOLINE) 50 MG tablet TAKE 1 TABLET BY MOUTH THREE TIMES DAILY   . insulin aspart (NOVOLOG) 100 UNIT/ML FlexPen Administer 5 units in the morning with breakfast, 20 units with lunch, and 25 units with dinner daily.  ICD Code:  E11.9   .  insulin degludec (TRESIBA FLEXTOUCH) 200 UNIT/ML FlexTouch Pen INJECT 45 UNITS SUBCUTANEOUSLY ONCE DAILY   . Insulin Pen Needle (B-D UF III MINI PEN NEEDLES) 31G X 5 MM MISC 1 each by Other route See admin instructions. USE 4-5 TIMES DAILY   . Insulin Pen Needle (PEN NEEDLES 3/16") 31G X 5 MM MISC Use five times daily   . Lancet Devices (ACCU-CHEK SOFTCLIX) lancets Use to check blood sugars 5 times a day. Dx code:E11.9. Insulin dependent. (Patient taking differently: 1 each by Other route  See admin instructions. Use to check blood sugars 5 times a day. Dx code:E11.9. Insulin dependent.)   . lovastatin (MEVACOR) 20 MG tablet Take 1 tablet by mouth once daily   . Omega-3 Fatty Acids (FISH OIL) 1000 MG CAPS Take 1,000 mg by mouth at bedtime.    . ondansetron (ZOFRAN) 4 MG tablet Take 1 tablet (4 mg total) by mouth every 6 (six) hours as needed for nausea.   Marland Kitchen oxybutynin (DITROPAN) 5 MG tablet Take 5 mg by mouth 3 (three) times daily.    . polyethylene glycol (MIRALAX / GLYCOLAX) 17 g packet Take 17 g by mouth 2 (two) times daily. (Patient taking differently: Take 17 g by mouth as needed for mild constipation.)   . potassium chloride SA (KLOR-CON) 20 MEQ tablet Take 20 mEq by mouth 2 (two) times daily.   Marland Kitchen senna-docusate (SENOKOT-S) 8.6-50 MG tablet Take 1 tablet by mouth daily. (Patient taking differently: Take 1 tablet by mouth as needed for mild constipation.)   . sildenafil (VIAGRA) 50 MG tablet Take 50 mg by mouth as needed for erectile dysfunction.   . tamsulosin (FLOMAX) 0.4 MG CAPS capsule Take 0.4 mg by mouth at bedtime.    Marland Kitchen VICTOZA 18 MG/3ML SOPN INJECT 1.2 MG INTO THE SKIN ONCE DAILY AT THE SAME TIME.    No facility-administered encounter medications on file as of 05/10/2020.    Patient Active Problem List   Diagnosis Date Noted  . URI, acute 03/30/2020  . Body aches 03/04/2020  . Hypoglycemia 11/28/2019  . Abdominal pain 08/29/2019  . Anemia associated with chronic renal failure   . CKD (chronic kidney disease)   . Peripheral edema   . Hypervolemia associated with renal insufficiency 07/30/2019  . Acute renal failure with acute tubular necrosis superimposed on stage 4 chronic kidney disease ([place])   . Acute pain of left knee 01/01/2019  . Hemoglobinuria 12/12/2018  . Hematoma of left lower leg 12/12/2018  . Urinary tract infection 09/04/2018  . Exposure to SARS-associated coronavirus 07/25/2018  . Headache 06/18/2018  . Leg swelling   . Cellulitis 04/12/2018  .  Pain of left lower extremity 03/20/2018  . OSA (obstructive sleep apnea) 02/28/2018  . Chronic diastolic heart failure ([place]) 02/02/2018  . Healthcare maintenance 08/15/2017  . Erectile dysfunction 04/22/2017  . History of pulmonary embolus (PE) 06/09/2016  . Anxiety 05/04/2016  . Depression 05/04/2016  . Hypogonadism in male 05/04/2016  . S/P AKA (above knee amputation) unilateral, right ([place]) 05/04/2016  . Urinary retention 05/04/2016  . Anemia of chronic disease 05/04/2016  . Morbid obesity with BMI of 40.0-44.9, adult ([place]) 10/14/2015  . Dyslipidemia 08/12/2014  . Pedal edema 08/12/2014  . Uncontrolled type 2 diabetes mellitus with hyperglycemia, with long-term current use of insulin ([place]) 08/12/2014  . Constipation   . Unilateral AKA, right ([place]) 12/26/2013  . Hypoglycemic event in diabetes ([place]) 12/17/2013  . Hyperlipidemia 02/19/2013  . S/P cardiac cath, 02/18/13, normal coronaries 02/19/2013  .  Chronic renal insufficiency, stage 3 (moderate) ([place]) 02/19/2013  . Hypertension 07/24/2011  . Long term current use of anticoagulant therapy 07/19/2011  . History of DVT (deep vein thrombosis) 07/28/2009  . Morbid obesity ([place]) 10/26/2005  . Insulin dependent type 2 diabetes mellitus, controlled ([place]) 01/09/2002    Conditions to be addressed/monitored:  IDDM, HF, HLD, HTN, CKD, s/p R AKA 02/20/14  Care Plan : Diabetes Type 2 (Adult)  Updates made by Barrington Ellison, RN since 05/10/2020 12:00 AM    Problem: Currently meeting A1C target but having large fluctuations in blood sugars   Priority: High  Onset Date: 12/15/2019    Goal: Glycemic Management Optimized without large fluctuations in blood sugars   Start Date: 12/15/2019  Expected End Date: 07/08/2020  Recent Progress: On track  Priority: High  Note:   Objective:  . Lab Results .  Component . Value . Date .   Marland Kitchen HGBA1C . 8.0 (A) . 03/04/2020 .    Marland Kitchen Lab Results .  Component . Value . Date .   Marland Kitchen CREATININE . 2.93 (H)  . 02/27/2020 .    Marland Kitchen No results found for: EGFR Current Barriers:  Marland Kitchen Knowledge Deficits related to basic Diabetes pathophysiology and self care/management . Unable to independently meet pre and post meal CBG targets - successful outreach to patient. He states he continues to wear the Freestyle Libre flash glucose monitoring system and he loves it, he says the majority of his blood sugars are in range, says he saw Dr Dwyane Dee on 4/14 and adjustment were made to his DM medications, he says he did not receive a call from [facility] about his request to replace the w/c that was delivered with one that will allow him to play w/c basketball  Case Manager Clinical Goal(s):  Marland Kitchen Over the next 30-60 days, patient will demonstrate improved adherence to prescribed treatment plan for diabetes self care/management as evidenced by:  . daily monitoring and recording of CBG  . adherence to ADA/ carb modified diet . exercise 5-7 days/week- adherence to prescribed medication regimen Interventions:  . Review of patient status, including review of consultants reports, relevant laboratory and other test results, and medications completed. . Provided education to patient about basic DM disease process and negative impact of highly fluctuating CBGs on all body systems . Assessed satisfaction with Freestyle Libre Flash Glucose monitor . Assessed frequency of interstitial fluid glucose readings  being in target . Discussed relationship of interstitial fluid glucose and capillary glucose . Reviewed Dr. Ronnie Derby office note of 4/14 in detail with patient including changes to DM medication . Reviewed medications with patient and discussed importance of good medication taking behavior . 4/24- per patient request e-mail sent to [facility] requesting assistance in securing w/c for patient to allow him to play basket ball . 5/2- Secure e-mail sent to patient advising him there has been no response to e-mail sent  to [facility]eman [facility] on 4/[facility]; suggested he contact Dr Jone Baseman since he was a former patient of hers.  . Patient encouraged to continue his exercise routine with his personal trainer . Positive  reinforcement given for his successful weight loss and consistency with personal training session . - A1C testing facilitated . - barriers to adherence to treatment plan identified . - dental care encouraged . - mutual A1C goal set or reviewed . - devices to improve adherence to care identified . - self-awareness of signs/symptoms of hypo  or hyperglycemia encouraged . Discussed plans with patient for ongoing care management follow up and provided patient with direct contact information for care management team . Reviewed scheduled/upcoming provider appointments including: Aranesp injection on 3/17, for labs on 4/7 and with Dr Dwyane Dee on 4/14 . - A1C testing facilitated . - barriers to adherence to treatment plan identified . - dental care encouraged . - mutual A1C goal set or reviewed . - devices to improve adherence to care identified . - self-awareness of signs/symptoms of hypo or hyperglycemia encouraged . Discussed plans with patient for ongoing care management follow up and provided patient with direct contact information for care management team . In the next 30-60 days, patient will:  - Self administers oral medications as prescribed Self administers insulin as prescribed Self administers injectable DM medication Victoza and Tyler Aas as prescribed Attends all scheduled provider appointments Checks blood sugars as prescribed and utilize hyper and hypoglycemia protocol as needed Adheres to prescribed ADA/carb modified Follow Up Plan: The care management team will reach out to the patient again over the next 30-60 days.       Plan: The care management team will reach out to the patient again over the next 30-60 days.  Kelli Churn RN, CCM, [facility] RN Care  Manager [phone]

## 2020-05-12 DIAGNOSIS — E113312 Type 2 diabetes mellitus with moderate nonproliferative diabetic retinopathy with macular edema, left eye: Secondary | ICD-10-CM | POA: Diagnosis not present

## 2020-05-18 ENCOUNTER — Ambulatory Visit: Payer: Medicare Other | Admitting: *Deleted

## 2020-05-18 DIAGNOSIS — I10 Essential (primary) hypertension: Secondary | ICD-10-CM

## 2020-05-18 DIAGNOSIS — I129 Hypertensive chronic kidney disease with stage 1 through stage 4 chronic kidney disease, or unspecified chronic kidney disease: Secondary | ICD-10-CM | POA: Diagnosis not present

## 2020-05-18 DIAGNOSIS — E785 Hyperlipidemia, unspecified: Secondary | ICD-10-CM

## 2020-05-18 DIAGNOSIS — N184 Chronic kidney disease, stage 4 (severe): Secondary | ICD-10-CM | POA: Diagnosis not present

## 2020-05-18 DIAGNOSIS — E876 Hypokalemia: Secondary | ICD-10-CM | POA: Diagnosis not present

## 2020-05-18 DIAGNOSIS — E1165 Type 2 diabetes mellitus with hyperglycemia: Secondary | ICD-10-CM

## 2020-05-18 DIAGNOSIS — N2581 Secondary hyperparathyroidism of renal origin: Secondary | ICD-10-CM | POA: Diagnosis not present

## 2020-05-18 DIAGNOSIS — Z89611 Acquired absence of right leg above knee: Secondary | ICD-10-CM

## 2020-05-18 DIAGNOSIS — E669 Obesity, unspecified: Secondary | ICD-10-CM | POA: Diagnosis not present

## 2020-05-18 NOTE — Chronic Care Management (AMB) (Addendum)
Care Management    RN Visit Note  05/18/2020 Name: [name] MRN: [id number] DOB: [birth year]  Subjective: [name] is a 45 y.o. year old male who is a primary care patient of [name], MD. The care management team was consulted for assistance with disease management and care coordination needs.    Engaged with patient by telephone for follow up visit in response to provider referral for case management and/or care coordination services.   Consent to Services:   [name] was given information about Care Management services today including:  1. Care Management services includes personalized support from designated clinical staff supervised by his physician, including individualized plan of care and coordination with other care providers 2. 24/7 contact phone numbers for assistance for urgent and routine care needs. 3. The patient may stop case management services at any time by phone call to the office staff.  Patient agreed to services and consent obtained.   Assessment: Review of patient past medical history, allergies, medications, health status, including review of consultants reports, laboratory and other test data, was performed as part of comprehensive evaluation and provision of chronic care management services.   SDOH (Social Determinants of Health) assessments and interventions performed:    Care Plan  Allergies  Allergen Reactions  . Reglan [Metoclopramide] Other (See Comments)    Dysphoric reaction  . Metformin And Related Diarrhea  . Ozempic (0.25 Or 0.5 Mg-Dose) [Semaglutide(0.25 Or 0.20m-Dos)] Nausea And Vomiting    Outpatient Encounter Medications as of 05/18/2020  Medication Sig Note  . Accu-Chek Softclix Lancets lancets Check blood sugar up to 3 times a day (Patient taking differently: 1 each by Other route See admin instructions. Check blood sugar up to 4-5 times a day)   . acidophilus (RISAQUAD) CAPS capsule Take 1 capsule by mouth daily.   .  Alpha-Lipoic Acid 600 MG CAPS Take 600 mg by mouth daily.   .Marland KitchenamLODipine (NORVASC) 10 MG tablet Take 10 mg by mouth at bedtime.   .Marland Kitchenapixaban (ELIQUIS) 5 MG TABS tablet Take 1 tablet (5 mg total) by mouth 2 (two) times daily.   . Blood Glucose Monitoring Suppl (ACCU-CHEK GUIDE ME) w/Device KIT Use to check blood sugar up to 3 times a day (Patient taking differently: 1 each by Other route See admin instructions. Use to check blood sugar up to 4-5 times a day)   . carvedilol (COREG) 25 MG tablet Take 1 tablet (25 mg total) by mouth 2 (two) times daily with a meal.   . Cyanocobalamin (B-12) 500 MCG SUBL Place 500 mcg under the tongue daily.    . famotidine (PEPCID) 20 MG tablet Take 1 tablet (20 mg total) by mouth 2 (two) times daily. (Patient taking differently: Take 20 mg by mouth as needed for heartburn or indigestion.) 11/28/2019: .  . fluticasone (FLONASE) 50 MCG/ACT nasal spray Place 2 sprays into both nostrils daily.   . furosemide (LASIX) 80 MG tablet Take 1 tablet by mouth twice daily 02/17/2020: Pt states [name] needs refills  . gabapentin (NEURONTIN) 100 MG capsule TAKE 1 CAPSULE BY MOUTH THREE TIMES DAILY   . glucose blood (ACCU-CHEK GUIDE) test strip Check blood sugar up to 3 times a day (Patient taking differently: 1 each by Other route See admin instructions. Check blood sugar up to 4-5 times a day)   . hydrALAZINE (APRESOLINE) 50 MG tablet TAKE 1 TABLET BY MOUTH THREE TIMES DAILY   . insulin aspart (NOVOLOG) 100 UNIT/ML FlexPen Administer 5 units  in the morning with breakfast, 20 units with lunch, and 25 units with dinner daily.  ICD Code:  E11.9   . insulin degludec (TRESIBA FLEXTOUCH) 200 UNIT/ML FlexTouch Pen INJECT 45 UNITS SUBCUTANEOUSLY ONCE DAILY   . Insulin Pen Needle (B-D UF III MINI PEN NEEDLES) 31G X 5 MM MISC 1 each by Other route See admin instructions. USE 4-5 TIMES DAILY   . Insulin Pen Needle (PEN NEEDLES 3/16") 31G X 5 MM MISC Use five times daily   . Lancet Devices (ACCU-CHEK  SOFTCLIX) lancets Use to check blood sugars 5 times a day. Dx code:E11.9. Insulin dependent. (Patient taking differently: 1 each by Other route See admin instructions. Use to check blood sugars 5 times a day. Dx code:E11.9. Insulin dependent.)   . lovastatin (MEVACOR) 20 MG tablet Take 1 tablet by mouth once daily   . Omega-3 Fatty Acids (FISH OIL) 1000 MG CAPS Take 1,000 mg by mouth at bedtime.    . ondansetron (ZOFRAN) 4 MG tablet Take 1 tablet (4 mg total) by mouth every 6 (six) hours as needed for nausea.   Marland Kitchen oxybutynin (DITROPAN) 5 MG tablet Take 5 mg by mouth 3 (three) times daily.    . polyethylene glycol (MIRALAX / GLYCOLAX) 17 g packet Take 17 g by mouth 2 (two) times daily. (Patient taking differently: Take 17 g by mouth as needed for mild constipation.)   . potassium chloride SA (KLOR-CON) 20 MEQ tablet Take 20 mEq by mouth 2 (two) times daily.   Marland Kitchen senna-docusate (SENOKOT-S) 8.6-50 MG tablet Take 1 tablet by mouth daily. (Patient taking differently: Take 1 tablet by mouth as needed for mild constipation.)   . sildenafil (VIAGRA) 50 MG tablet Take 50 mg by mouth as needed for erectile dysfunction.   . tamsulosin (FLOMAX) 0.4 MG CAPS capsule Take 0.4 mg by mouth at bedtime.    Marland Kitchen VICTOZA 18 MG/3ML SOPN INJECT 1.2 MG INTO THE SKIN ONCE DAILY AT THE SAME TIME.    No facility-administered encounter medications on file as of 05/18/2020.    Patient Active Problem List   Diagnosis Date Noted  . URI, acute 03/30/2020  . Body aches 03/04/2020  . Hypoglycemia 11/28/2019  . Abdominal pain 08/29/2019  . Anemia associated with chronic renal failure   . CKD (chronic kidney disease)   . Peripheral edema   . Hypervolemia associated with renal insufficiency 07/30/2019  . Acute renal failure with acute tubular necrosis superimposed on stage 4 chronic kidney disease ([place])   . Acute pain of left knee 01/01/2019  . Hemoglobinuria 12/12/2018  . Hematoma of left lower leg 12/12/2018  . Urinary tract  infection 09/04/2018  . Exposure to SARS-associated coronavirus 07/25/2018  . Headache 06/18/2018  . Leg swelling   . Cellulitis 04/12/2018  . Pain of left lower extremity 03/20/2018  . OSA (obstructive sleep apnea) 02/28/2018  . Chronic diastolic heart failure ([place]) 02/02/2018  . Healthcare maintenance 08/15/2017  . Erectile dysfunction 04/22/2017  . History of pulmonary embolus (PE) 06/09/2016  . Anxiety 05/04/2016  . Depression 05/04/2016  . Hypogonadism in male 05/04/2016  . S/P AKA (above knee amputation) unilateral, right ([place]) 05/04/2016  . Urinary retention 05/04/2016  . Anemia of chronic disease 05/04/2016  . Morbid obesity with BMI of 40.0-44.9, adult ([place]) 10/14/2015  . Dyslipidemia 08/12/2014  . Pedal edema 08/12/2014  . Uncontrolled type 2 diabetes mellitus with hyperglycemia, with long-term current use of insulin ([place]) 08/12/2014  . Constipation   . Unilateral AKA, right (  [place]) 12/26/2013  . Hypoglycemic event in diabetes ([place]) 12/17/2013  . Hyperlipidemia 02/19/2013  . S/P cardiac cath, 02/18/13, normal coronaries 02/19/2013  . Chronic renal insufficiency, stage 3 (moderate) ([place]) 02/19/2013  . Hypertension 07/24/2011  . Long term current use of anticoagulant therapy 07/19/2011  . History of DVT (deep vein thrombosis) 07/28/2009  . Morbid obesity ([place]) 10/26/2005  . Insulin dependent type 2 diabetes mellitus, controlled ([place]) 01/09/2002    Conditions to be addressed/monitored:  IDDM, HF, HLD, HTN, CKD, s/p R AKA 02/20/14  Care Plan : Diabetes Type 2 (Adult)  Updates made by Barrington Ellison, RN since 05/18/2020 12:00 AM    Problem: Currently meeting A1C target but having large fluctuations in blood sugars   Priority: High  Onset Date: 12/15/2019    Goal: Glycemic Management Optimized without large fluctuations in blood sugars   Start Date: 12/15/2019  Expected End Date: 07/08/2020  Recent Progress: On track  Priority: High  Note:   Objective:  . Lab Results  .  Component . Value . Date .   Marland Kitchen HGBA1C . 8.0 (A) . 03/04/2020 .    Marland Kitchen Lab Results .  Component . Value . Date .   Marland Kitchen CREATININE . 2.93 (H) . 02/27/2020 .    Marland Kitchen No results found for: EGFR Current Barriers:  Marland Kitchen Knowledge Deficits related to basic Diabetes pathophysiology and self care/management . Unable to independently meet pre and post meal CBG targets - successful outreach to patient. [name] states [name] continues to wear the Freestyle Libre flash glucose monitoring system and [name] loves it, [name] says the majority of his blood sugars are in range, says [name] saw Dr Dwyane Dee on 4/14 and adjustment were made to his DM medications, [name] says [name] did not receive a call from [place] about his request to replace the w/c that was delivered with one that will allow him to play w/c basketball  Case Manager Clinical Goal(s):  Marland Kitchen Over the next 30-60 days, patient will demonstrate improved adherence to prescribed treatment plan for diabetes self care/management as evidenced by:  . daily monitoring and recording of CBG  . adherence to ADA/ carb modified diet . exercise 5-7 days/week- adherence to prescribed medication regimen Interventions:  . Review of patient status, including review of consultants reports, relevant laboratory and other test results, and medications completed. . Provided education to patient about basic DM disease process and negative impact of highly fluctuating CBGs on all body systems . Assessed satisfaction with Freestyle Libre Flash Glucose monitor . Assessed frequency of interstitial fluid glucose readings  being in target . Discussed relationship of interstitial fluid glucose and capillary glucose . Reviewed Dr. Ronnie Derby office note of 4/14 in detail with patient including changes to DM medication . Reviewed medications with patient and discussed importance of good medication taking behavior . 4/24- per patient request e-mail sent to Dr Jamey Re[facility] P.T. at [facility] requesting assistance in  securing w/c for patient to allow him to play basket ball . 5/2- Secure e-mail sent to patient advising him there has been no response to e-mail sent to Dr Jone Baseman PT at [facility] on 4/24; suggested [name] contact Dr Jone Baseman since [name] was a former patient of hers.  . 05/18/20- Returned call to patient and advised this CCM RN did not receive reply to e-mail sent to Dr Jone Baseman at [facility] neuro rehab. Requested patient call Dr Jone Baseman to see if she can provide guidance with securing specialized w/c for patient. Patient agrees to  call her. . 05/18/20- At patient's suggestion, messaged clinic RN Howell Rucks , requesting guidance with securing specialized w/c for patient.  . Patient encouraged to continue his exercise routine with his personal trainer . Positive  reinforcement given for his successful weight loss and consistency with personal training session . - A1C testing facilitated . - barriers to adherence to treatment plan identified . - dental care encouraged . - mutual A1C goal set or reviewed . - devices to improve adherence to care identified . - self-awareness of signs/symptoms of hypo or hyperglycemia encouraged . Discussed plans with patient for ongoing care management follow up and provided patient with direct contact information for care management team . Reviewed scheduled/upcoming provider appointments including: Aranesp injection on 3/17, for labs on 4/7 and with Dr Dwyane Dee on 4/14 . - A1C testing facilitated . - barriers to adherence to treatment plan identified . - dental care encouraged . - mutual A1C goal set or reviewed . - devices to improve adherence to care identified . - self-awareness of signs/symptoms of hypo or hyperglycemia encouraged . Discussed plans with patient for ongoing care management follow up and provided patient with direct contact information for care management team . In the next 30-60 days, patient will:  - Self administers oral medications as prescribed Self  administers insulin as prescribed Self administers injectable DM medication Victoza and Tyler Aas as prescribed Attends all scheduled provider appointments Checks blood sugars as prescribed and utilize hyper and hypoglycemia protocol as needed Adheres to prescribed ADA/carb modified Follow Up Plan: The care management team will reach out to the patient again over the next 30-60 days.       Kelli Churn RN, CCM, [facility] RN Care Manager [phone]

## 2020-05-19 ENCOUNTER — Telehealth: Payer: Self-pay | Admitting: *Deleted

## 2020-05-19 NOTE — Telephone Encounter (Signed)
Spoke with Ky Barban at [facility]. Explained patient is requesting a w/c that Dr. Jamey Reas suggested to play w/c basketball. She will have Dr. Jone Baseman return my call.

## 2020-05-19 NOTE — Telephone Encounter (Signed)
-----   Message from Barrington Ellison, RN sent at 05/18/2020  7:20 PM EDT ----- Corrin Parker, I am at a standstill in helping Mr Windmiller and so he asked that I reach out to you.  He is requesting a specialized w/c that will allow him to participate in w/c basketball. I sent a community message to the [employer] reps and w/o consulting him or me, delivered a regular w/c to his home. Needles to say Mr Williard was not happy. He then asked me to reach out to a PT at the neuro rehab on [address] , Dr Jamey Reas, as she is the one that suggested this w/c to him when he worked with her as a patient. I emailed her and she did not respond so I asked Mr Knoth to contact her to see if she can provide some direction.  Do you have any suggestions? Thanks in advance, Marcie Bal

## 2020-05-20 ENCOUNTER — Ambulatory Visit (INDEPENDENT_AMBULATORY_CARE_PROVIDER_SITE_OTHER): Payer: Medicare Other | Admitting: Internal Medicine

## 2020-05-20 ENCOUNTER — Other Ambulatory Visit: Payer: Self-pay

## 2020-05-20 ENCOUNTER — Encounter: Payer: Self-pay | Admitting: Internal Medicine

## 2020-05-20 VITALS — BP 134/68 | HR 91 | Temp 97.9°F | Ht 74.0 in | Wt 332.7 lb

## 2020-05-20 DIAGNOSIS — Z794 Long term (current) use of insulin: Secondary | ICD-10-CM

## 2020-05-20 DIAGNOSIS — E119 Type 2 diabetes mellitus without complications: Secondary | ICD-10-CM

## 2020-05-20 DIAGNOSIS — S46811A Strain of other muscles, fascia and tendons at shoulder and upper arm level, right arm, initial encounter: Secondary | ICD-10-CM | POA: Insufficient documentation

## 2020-05-20 MED ORDER — BACLOFEN 10 MG PO TABS: 5.0000 mg | ORAL_TABLET | Freq: Every day | ORAL | 0 refills | Status: AC

## 2020-05-20 NOTE — Assessment & Plan Note (Signed)
[name] presents to [facility] w/ complaint of upper back pain. He mentions doing resistance training at the gym which he regularly frequents and noticing significant pain Sunday night after particularly heavy work out on last Saturday. He describes the pain as constant soreness that is exacerbated by exertion. He has been treating the pain with heat pads and massages at home with only mild relief. He mentions pain is severe enough to interfere with his chores at home such as working on his yard.  A/P Presenting with upper back pain. On exam, shoulder rom intact, tenderness of trapezius muscle with increased tonicity. Appear to be due to trapezius strain. Discussed self-resolving mature of muscle strains and treating with supportive care. [name] expressed understanding. Advised to c/w heat, massage. Will also prescribe low dose of muscle relaxer for relief. - Baclofen 5-10mg  PRN - C/w heat, massage, tylenol - Advised to avoid heavy machinery or driving while using baclofen - Also advised on topical options for pain relief

## 2020-05-20 NOTE — Progress Notes (Signed)
CC: Back pain  HPI: [name] is a 45 y.o. with PMH listed below presenting with complaint of back pain. Please see problem based assessment and plan for further details.  Past Medical History:  Diagnosis Date  . Acute venous embolism and thrombosis of deep vessels of proximal lower extremity ([place]) 07/19/2011  . Anemia   . Anginal pain ([place])    pt denies  . Anxiety   . Chest pain, neg MI, normal coronaries by cath 02/18/2013   pt denies  . CHF (congestive heart failure) ([place])   . CKD (chronic kidney disease) stage 3, GFR 30-59 ml/min ([facility]) 02/19/2013  . Colon polyps   . Depression   . Diabetic ulcer of right foot ([place])   . DVT (deep venous thrombosis) ([place]) 09/2002   patient reports additional DVTs in '06 & '11 (unconfirmed)  . ED (erectile dysfunction)   . GERD (gastroesophageal reflux disease)   . History of blood transfusion    "related to OR" (10/31/2016)  . Hyperlipidemia 02/19/2013  . Hypertension   . Nausea & vomiting    "constant for the last couple weeks" (10/31/2016)  . Nephrotic syndrome   . Obesity    BMI 44, weight 346 pounds 01/30/14  . OSA (obstructive sleep apnea) 02/28/2018   Mild obstructive sleep apnea with an AHI of 9.8/h but severe during REM sleep with an AHI of 33.8/h.  Oxygen saturations dropped to 86% and there was moderate snoring  . Peripheral edema   . Pneumonia   . Prosthesis adjustment 08/17/2016  . Pulmonary embolism ([place]) 09/2002   treated with 6 months of warfarin  . Pyelonephritis 02/02/2018  . Type I diabetes mellitus ([place]) dx'd 2001  . Urinary retention    Review of Systems: Review of Systems  Constitutional: Negative for chills, fever and malaise/fatigue.  Eyes: Negative for blurred vision.  Cardiovascular: Negative for chest pain, palpitations and leg swelling.  Gastrointestinal: Negative for constipation, diarrhea, nausea and vomiting.  Musculoskeletal: Positive for back pain and joint pain.  Neurological: Negative for  dizziness.  All other systems reviewed and are negative.   Physical Exam: Vitals:   05/20/20 0915  BP: 134/68  Pulse: 91  Temp: 97.9 F (36.6 C)  SpO2: 99%  Weight: (!) 332 lb 11.2 oz (150.9 kg)  Height: 6\' 2"  (1.88 m)   Gen: Well-developed, well nourished, NAD HEENT: NCAT head, hearing intact CV: RRR, S1, S2 normal Pulm: CTAB, No rales, no wheezes MSK: R shoulder active and passive range of motion intact. Pain with external rotation. Negative empty can test, No edema, erythema, warmth of R shoulder. Tenderness to palpation on R lower trapezius with increased tonicity. No asymmetry, Chronic RLE AKA with prosthetic Skin: Dry, Warm, normal turgor  Assessment & Plan:   Trapezius strain, right, initial encounter [name] presents to [facility] w/ complaint of upper back pain. He mentions doing resistance training at the gym which he regularly frequents and noticing significant pain Sunday night after particularly heavy work out on last Saturday. He describes the pain as constant soreness that is exacerbated by exertion. He has been treating the pain with heat pads and massages at home with only mild relief. He mentions pain is severe enough to interfere with his chores at home such as working on his yard.  A/P Presenting with upper back pain. On exam, shoulder rom intact, tenderness of trapezius muscle with increased tonicity. Appear to be due to trapezius strain. Discussed self-resolving mature of muscle strains and treating with  supportive care. [name] expressed understanding. Advised to c/w heat, massage. Will also prescribe low dose of muscle relaxer for relief. - Baclofen 5-10mg  PRN - C/w heat, massage, tylenol - Advised to avoid heavy machinery or driving while using baclofen - Also advised on topical options for pain relief  Insulin dependent type 2 diabetes mellitus, controlled ([place]) Lab Results  Component Value Date   HGBA1C 6.0 04/15/2020   At goal, Follows with [facility]  endocrinology. Despite anemia of chronic disease possibly overestimating hgb a1c, at goal of <7. Prior lab show cbg in 18s. He mentions having intermittent hypoglycemic events during work-out but always carry snacks and glucose tablets as needed. Advised on signs and symptoms of hypoglycemia and reducing his novolog as needed to avoid hypoglycemic episodes.  A/P - F/u with [place] endocrinology - C/w victoza 1.8mg  daily, Tresiba 45 units daily - C/w novolog 20 units lunch, 25 units dinner (titrate down am dose if working out)    Patient discussed with Dr. Philipp Ovens  -Gilberto Better, [facility] Pager: [phone]

## 2020-05-20 NOTE — Patient Instructions (Addendum)
Dear [name],  Thank you for allowing [place] to provide your care today. Today we discussed your back pain    I have ordered no labs for you. I will call if any are abnormal.    Today we made the following changes to your medications:    Please take baclofen 5mg  as needed, no more than 3 times a day. You can increase up to 10mg  if pain is inadequately controlled  Please follow-up as needed.    Please call the internal medicine center clinic if you have any questions or concerns, we may be able to help and keep you from a long and expensive emergency room wait. Our clinic and after hours phone number is [phone], the best time to call is Monday through Friday 9 am to 4 pm but there is always someone available 24/7 if you have an emergency. If you need medication refills please notify your pharmacy one week in advance and they will send [place] a request.    If you have not gotten the COVID vaccine, I recommend doing so:  You may get it at your local [facility] or [facility] OR To schedule an appointment for a COVID vaccine or be added to the vaccine wait list: Go to [email or web address]   OR Go to [email or web address]                  OR Call [phone]                                     OR Call [phone] and select Option 2  Thank you for choosing [facility]  Baclofen tablets What is this medicine? BACLOFEN (BAK loe fen) helps relieve spasms and cramping of muscles. It may be used to treat symptoms of multiple sclerosis or spinal cord injury. This medicine may be used for other purposes; ask your health care provider or pharmacist if you have questions. COMMON BRAND NAME(S): ED Baclofen, Lioresal What should I tell my health care provider before I take this medicine? They need to know if you have any of these conditions:  kidney disease  seizures  stroke  an unusual or allergic reaction to baclofen, other medicines, foods, dyes, or preservatives  pregnant or  trying to get pregnant  breast-feeding How should I use this medicine? Take this medicine by mouth. Swallow it with a drink of water. Follow the directions on the prescription label. Do not take more medicine than you are told to take. Talk to your pediatrician regarding the use of this medicine in children. Special care may be needed. Overdosage: If you think you have taken too much of this medicine contact a poison control center or emergency room at once. NOTE: This medicine is only for you. Do not share this medicine with others. What if I miss a dose? If you miss a dose, take it as soon as you can. If it is almost time for your next dose, take only that dose. Do not take double or extra doses. What may interact with this medicine? Do not take this medication with any of the following medicines:  narcotic medicines for cough This medicine may also interact with the following medications:  alcohol  antihistamines for allergy, cough and cold  certain medicines for anxiety or sleep  certain medicines for depression like amitriptyline, fluoxetine, sertraline  certain medicines for seizures like phenobarbital, primidone  general anesthetics like halothane,  isoflurane, methoxyflurane, propofol  local anesthetics like lidocaine, pramoxine, tetracaine  medicines that relax muscles for surgery  narcotic medicines for pain  phenothiazines like chlorpromazine, mesoridazine, prochlorperazine, thioridazine This list may not describe all possible interactions. Give your health care provider a list of all the medicines, herbs, non-prescription drugs, or dietary supplements you use. Also tell them if you smoke, drink alcohol, or use illegal drugs. Some items may interact with your medicine. What should I watch for while using this medicine? Tell your doctor or health care professional if your symptoms do not start to get better or if they get worse. Do not suddenly stop taking your  medicine. If you do, you may develop a severe reaction. If your doctor wants you to stop the medicine, the dose will be slowly lowered over time to avoid any side effects. Follow the advice of your doctor. You may get drowsy or dizzy. Do not drive, use machinery, or do anything that needs mental alertness until you know how this medicine affects you. Do not stand or sit up quickly, especially if you are an older patient. This reduces the risk of dizzy or fainting spells. Alcohol may interfere with the effect of this medicine. Avoid alcoholic drinks. If you are taking another medicine that also causes drowsiness, you may have more side effects. Give your health care provider a list of all medicines you use. Your doctor will tell you how much medicine to take. Do not take more medicine than directed. Call emergency for help if you have problems breathing or unusual sleepiness. What side effects may I notice from receiving this medicine? Side effects that you should report to your doctor or health care professional as soon as possible:  allergic reactions like skin rash, itching or hives, swelling of the face, lips, or tongue  breathing problems  changes in emotions or moods  changes in vision  chest pain  fast, irregular heartbeat  feeling faint or lightheaded, falls  hallucinations  loss of balance or coordination  ringing of the ears  seizures  trouble passing urine or change in the amount of urine  trouble walking  unusually weak or tired Side effects that usually do not require medical attention (report to your doctor or health care professional if they continue or are bothersome):  changes in taste  confusion  constipation  diarrhea  dry mouth  headache  muscle weakness  nausea, vomiting  trouble sleeping This list may not describe all possible side effects. Call your doctor for medical advice about side effects. You may report side effects to FDA at  1-800-FDA-1088. Where should I keep my medicine? Keep out of the reach of children. Store at room temperature between 15 and 30 degrees C (59 and 86 degrees F). Keep container tightly closed. Throw away any unused medicine after the expiration date. NOTE: This sheet is a summary. It may not cover all possible information. If you have questions about this medicine, talk to your doctor, pharmacist, or health care provider.  2021 Elsevier/Gold Standard (2016-10-07 09:56:42)

## 2020-05-20 NOTE — Assessment & Plan Note (Signed)
Lab Results  Component Value Date   HGBA1C 6.0 04/15/2020   At goal, Follows with [facility] endocrinology. Despite anemia of chronic disease possibly overestimating hgb a1c, at goal of <7. Prior lab show cbg in 62s. He mentions having intermittent hypoglycemic events during work-out but always carry snacks and glucose tablets as needed. Advised on signs and symptoms of hypoglycemia and reducing his novolog as needed to avoid hypoglycemic episodes.  A/P - F/u with [place] endocrinology - C/w victoza 1.8mg  daily, Tresiba 45 units daily - C/w novolog 20 units lunch, 25 units dinner (titrate down am dose if working out)

## 2020-05-21 ENCOUNTER — Telehealth: Payer: Self-pay | Admitting: Physical Therapy

## 2020-05-21 NOTE — Telephone Encounter (Signed)
Pt called and LM stating that he needed to speak with you. I tried to call and see what about but he did not answer so LM letting him know I was going to send a note to you. CB [phone]

## 2020-05-22 NOTE — Progress Notes (Signed)
Internal Medicine Clinic Attending  Case discussed with Dr. Lee  At the time of the visit.  We reviewed the resident's history and exam and pertinent patient test results.  I agree with the assessment, diagnosis, and plan of care documented in the resident's note.    

## 2020-05-24 ENCOUNTER — Ambulatory Visit: Payer: Medicare Other | Admitting: *Deleted

## 2020-05-24 DIAGNOSIS — E119 Type 2 diabetes mellitus without complications: Secondary | ICD-10-CM

## 2020-05-24 DIAGNOSIS — E1165 Type 2 diabetes mellitus with hyperglycemia: Secondary | ICD-10-CM

## 2020-05-24 DIAGNOSIS — E785 Hyperlipidemia, unspecified: Secondary | ICD-10-CM

## 2020-05-24 DIAGNOSIS — S46811A Strain of other muscles, fascia and tendons at shoulder and upper arm level, right arm, initial encounter: Secondary | ICD-10-CM

## 2020-05-24 DIAGNOSIS — I10 Essential (primary) hypertension: Secondary | ICD-10-CM

## 2020-05-24 DIAGNOSIS — Z89611 Acquired absence of right leg above knee: Secondary | ICD-10-CM

## 2020-05-24 DIAGNOSIS — Z794 Long term (current) use of insulin: Secondary | ICD-10-CM

## 2020-05-24 NOTE — Chronic Care Management (AMB) (Signed)
Care Management    RN Visit Note  05/24/2020 Name: [name] MRN: [id number] DOB: 08/02/1975  Subjective: [name] is a 45 y.o. year old male who is a primary care patient of Victor Mercury, MD. The care management team was consulted for assistance with disease management and care coordination needs.    Consent to Services:   [name] was given information about Care Management services today including:  1. Care Management services includes personalized support from designated clinical staff supervised by his physician, including individualized plan of care and coordination with other care providers 2. 24/7 contact phone numbers for assistance for urgent and routine care needs. 3. The patient may stop case management services at any time by phone call to the office staff.  Patient agreed to services and consent obtained.   Assessment: Review of patient past medical history, allergies, medications, health status, including review of consultants reports, laboratory and other test data, was performed as part of comprehensive evaluation and provision of chronic care management services.   SDOH (Social Determinants of Health) assessments and interventions performed:    Care Plan  Allergies  Allergen Reactions  . Reglan [Metoclopramide] Other (See Comments)    Dysphoric reaction  . Metformin And Related Diarrhea  . Ozempic (0.25 Or 0.5 Mg-Dose) [Semaglutide(0.25 Or 0.73m-Dos)] Nausea And Vomiting    Outpatient Encounter Medications as of 05/24/2020  Medication Sig Note  . Accu-Chek Softclix Lancets lancets Check blood sugar up to 3 times a day (Patient taking differently: 1 each by Other route See admin instructions. Check blood sugar up to 4-5 times a day)   . acidophilus (RISAQUAD) CAPS capsule Take 1 capsule by mouth daily.   . Alpha-Lipoic Acid 600 MG CAPS Take 600 mg by mouth daily.   .Marland KitchenamLODipine (NORVASC) 10 MG tablet Take 10 mg by mouth at bedtime.   .Marland Kitchenapixaban  (ELIQUIS) 5 MG TABS tablet Take 1 tablet (5 mg total) by mouth 2 (two) times daily.   . baclofen (LIORESAL) 10 MG tablet Take 0.5-1 tablets (5-10 mg total) by mouth daily for 21 days. Start 551mand increase to 1077mor additional relief if needed   . Blood Glucose Monitoring Suppl (ACCU-CHEK GUIDE ME) w/Device KIT Use to check blood sugar up to 3 times a day (Patient taking differently: 1 each by Other route See admin instructions. Use to check blood sugar up to 4-5 times a day)   . carvedilol (COREG) 25 MG tablet Take 1 tablet (25 mg total) by mouth 2 (two) times daily with a meal.   . Cyanocobalamin (B-12) 500 MCG SUBL Place 500 mcg under the tongue daily.    . famotidine (PEPCID) 20 MG tablet Take 1 tablet (20 mg total) by mouth 2 (two) times daily. (Patient taking differently: Take 20 mg by mouth as needed for heartburn or indigestion.) 11/28/2019: .  . fluticasone (FLONASE) 50 MCG/ACT nasal spray Place 2 sprays into both nostrils daily.   . furosemide (LASIX) 80 MG tablet Take 1 tablet by mouth twice daily 02/17/2020: Pt states he needs refills  . gabapentin (NEURONTIN) 100 MG capsule TAKE 1 CAPSULE BY MOUTH THREE TIMES DAILY   . glucose blood (ACCU-CHEK GUIDE) test strip Check blood sugar up to 3 times a day (Patient taking differently: 1 each by Other route See admin instructions. Check blood sugar up to 4-5 times a day)   . hydrALAZINE (APRESOLINE) 50 MG tablet TAKE 1 TABLET BY MOUTH THREE TIMES DAILY   . insulin  aspart (NOVOLOG) 100 UNIT/ML FlexPen Administer 5 units in the morning with breakfast, 20 units with lunch, and 25 units with dinner daily.  ICD Code:  E11.9   . insulin degludec (TRESIBA FLEXTOUCH) 200 UNIT/ML FlexTouch Pen INJECT 45 UNITS SUBCUTANEOUSLY ONCE DAILY   . Insulin Pen Needle (B-D UF III MINI PEN NEEDLES) 31G X 5 MM MISC 1 each by Other route See admin instructions. USE 4-5 TIMES DAILY   . Insulin Pen Needle (PEN NEEDLES 3/16") 31G X 5 MM MISC Use five times daily   .  Lancet Devices (ACCU-CHEK SOFTCLIX) lancets Use to check blood sugars 5 times a day. Dx code:E11.9. Insulin dependent. (Patient taking differently: 1 each by Other route See admin instructions. Use to check blood sugars 5 times a day. Dx code:E11.9. Insulin dependent.)   . lovastatin (MEVACOR) 20 MG tablet Take 1 tablet by mouth once daily   . Omega-3 Fatty Acids (FISH OIL) 1000 MG CAPS Take 1,000 mg by mouth at bedtime.    . ondansetron (ZOFRAN) 4 MG tablet Take 1 tablet (4 mg total) by mouth every 6 (six) hours as needed for nausea.   Marland Kitchen oxybutynin (DITROPAN) 5 MG tablet Take 5 mg by mouth 3 (three) times daily.    . polyethylene glycol (MIRALAX / GLYCOLAX) 17 g packet Take 17 g by mouth 2 (two) times daily. (Patient taking differently: Take 17 g by mouth as needed for mild constipation.)   . potassium chloride SA (KLOR-CON) 20 MEQ tablet Take 20 mEq by mouth 2 (two) times daily.   Marland Kitchen senna-docusate (SENOKOT-S) 8.6-50 MG tablet Take 1 tablet by mouth daily. (Patient taking differently: Take 1 tablet by mouth as needed for mild constipation.)   . sildenafil (VIAGRA) 50 MG tablet Take 50 mg by mouth as needed for erectile dysfunction.   . tamsulosin (FLOMAX) 0.4 MG CAPS capsule Take 0.4 mg by mouth at bedtime.    Marland Kitchen VICTOZA 18 MG/3ML SOPN INJECT 1.2 MG INTO THE SKIN ONCE DAILY AT THE SAME TIME.    No facility-administered encounter medications on file as of 05/24/2020.    Patient Active Problem List   Diagnosis Date Noted  . Trapezius strain, right, initial encounter 05/20/2020  . Anemia associated with chronic renal failure   . CKD (chronic kidney disease)   . Hemoglobinuria 12/12/2018  . OSA (obstructive sleep apnea) 02/28/2018  . Chronic diastolic heart failure ([place]) 02/02/2018  . Healthcare maintenance 08/15/2017  . Erectile dysfunction 04/22/2017  . History of pulmonary embolus (PE) 06/09/2016  . Depression 05/04/2016  . Hypogonadism in male 05/04/2016  . S/P AKA (above knee amputation)  unilateral, right ([place]) 05/04/2016  . Morbid obesity with BMI of 40.0-44.9, adult ([place]) 10/14/2015  . Dyslipidemia 08/12/2014  . Uncontrolled type 2 diabetes mellitus with hyperglycemia, with long-term current use of insulin ([place]) 08/12/2014  . Unilateral AKA, right ([place]) 12/26/2013  . Hyperlipidemia 02/19/2013  . S/P cardiac cath, 02/18/13, normal coronaries 02/19/2013  . Chronic renal insufficiency, stage 3 (moderate) ([place]) 02/19/2013  . Hypertension 07/24/2011  . Long term current use of anticoagulant therapy 07/19/2011  . History of DVT (deep vein thrombosis) 07/28/2009  . Morbid obesity ([place]) 10/26/2005  . Insulin dependent type 2 diabetes mellitus, controlled ([place]) 01/09/2002    Conditions to be addressed/monitored:  IDDM, HF, HLD, HTN, CKD, s/p R AKA 02/20/14  Care Plan : Diabetes Type 2 (Adult)  Updates made by Barrington Ellison, RN since 05/24/2020 12:00 AM    Problem: Currently meeting A1C  target but having large fluctuations in blood sugars   Priority: High  Onset Date: 12/15/2019    Goal: Glycemic Management Optimized without large fluctuations in blood sugars   Start Date: 12/15/2019  Expected End Date: 07/08/2020  Recent Progress: On track  Priority: High  Note:   Objective:  . Lab Results .  Component . Value . Date .   Marland Kitchen HGBA1C . 8.0 (A) . 03/04/2020 .    Marland Kitchen Lab Results .  Component . Value . Date .   Marland Kitchen CREATININE . 2.93 (H) . 02/27/2020 .    Marland Kitchen No results found for: EGFR Current Barriers:  Marland Kitchen Knowledge Deficits related to basic Diabetes pathophysiology and self care/management . Unable to independently meet pre and post meal CBG targets - successful outreach to patient. He states he continues to wear the Freestyle Libre flash glucose monitoring system and he loves it, he says the majority of his blood sugars are in range, says he saw Dr Dwyane Dee on 4/14 and adjustment were made to his DM medications, he says he did not receive a call from [place] about his request to  replace the w/c that was delivered with one that will allow him to play w/c basketball  Case Manager Clinical Goal(s):  Marland Kitchen Over the next 30-60 days, patient will demonstrate improved adherence to prescribed treatment plan for diabetes self care/management as evidenced by:  . daily monitoring and recording of CBG  . adherence to ADA/ carb modified diet . exercise 5-7 days/week- adherence to prescribed medication regimen Interventions:  . Review of patient status, including review of consultants reports, relevant laboratory and other test results, and medications completed. . Provided education to patient about basic DM disease process and negative impact of highly fluctuating CBGs on all body systems . Assessed satisfaction with Freestyle Libre Flash Glucose monitor . Assessed frequency of interstitial fluid glucose readings  being in target . Discussed relationship of interstitial fluid glucose and capillary glucose . Reviewed Dr. Ronnie Derby office note of 4/14 in detail with patient including changes to DM medication . Reviewed medications with patient and discussed importance of good medication taking behavior . 4/24- per patient request e-mail sent to Dr Jamey Re[facility] P[facility]T. [facility] requesting assistance in securing w/c for patient to allow him to play basket ball . 5/2- Secure e-mail sent to patient advising him there has been no response to e-mail sent to Dr Jone Baseman PT at [facility] on 4/24; suggested he contact Dr Jone Baseman since he was a former patient of hers.  . 05/18/20- Returned call to patient and advised this CCM RN did not receive reply to e-mail sent to Dr Jone Baseman at [facility]. Requested patient call Dr Jone Baseman to see if she can provide guidance with securing specialized w/c for patient. Patient agrees to call her. . 05/18/20- At patient's suggestion, messaged clinic RN Howell Rucks , requesting guidance with securing specialized w/c for patient.  . 05/24/20- Received voice mail  from patient stating he spoke with Dr Jamey Reas PT PHD and she suggested Stefano Gaul be contacted at [phone]. Left message for Ms Jimmye Norman requesting return call.  . Patient encouraged to continue his exercise routine with his personal trainer . Positive  reinforcement given for his successful weight loss and consistency with personal training session . - A1C testing facilitated . - barriers to adherence to treatment plan identified . - dental care encouraged . - mutual A1C goal set or reviewed . - devices to improve adherence  to care identified . - self-awareness of signs/symptoms of hypo or hyperglycemia encouraged . Discussed plans with patient for ongoing care management follow up and provided patient with direct contact information for care management team . Reviewed scheduled/upcoming provider appointments including: Aranesp injection on 3/17, for labs on 4/7 and with Dr Dwyane Dee on 4/14 . - A1C testing facilitated . - barriers to adherence to treatment plan identified . - dental care encouraged . - mutual A1C goal set or reviewed . - devices to improve adherence to care identified . - self-awareness of signs/symptoms of hypo or hyperglycemia encouraged . Discussed plans with patient for ongoing care management follow up and provided patient with direct contact information for care management team . In the next 30-60 days, patient will:  - Self administers oral medications as prescribed Self administers insulin as prescribed Self administers injectable DM medication Victoza and Tyler Aas as prescribed Attends all scheduled provider appointments Checks blood sugars as prescribed and utilize hyper and hypoglycemia protocol as needed Adheres to prescribed ADA/carb modified Follow Up Plan: The care management team will reach out to the patient again over the next 30-60 days.       Kelli Churn RN, CCM, [facility] RN Care Manager [phone]

## 2020-05-24 NOTE — Telephone Encounter (Signed)
Left vm to call back

## 2020-06-01 DIAGNOSIS — E113212 Type 2 diabetes mellitus with mild nonproliferative diabetic retinopathy with macular edema, left eye: Secondary | ICD-10-CM | POA: Diagnosis not present

## 2020-06-04 ENCOUNTER — Ambulatory Visit: Payer: Medicare Other | Admitting: *Deleted

## 2020-06-04 ENCOUNTER — Encounter (HOSPITAL_COMMUNITY): Payer: Medicare Other

## 2020-06-04 DIAGNOSIS — Z794 Long term (current) use of insulin: Secondary | ICD-10-CM

## 2020-06-04 DIAGNOSIS — E119 Type 2 diabetes mellitus without complications: Secondary | ICD-10-CM

## 2020-06-04 DIAGNOSIS — I10 Essential (primary) hypertension: Secondary | ICD-10-CM

## 2020-06-04 DIAGNOSIS — Z89611 Acquired absence of right leg above knee: Secondary | ICD-10-CM

## 2020-06-04 DIAGNOSIS — E785 Hyperlipidemia, unspecified: Secondary | ICD-10-CM

## 2020-06-04 NOTE — Chronic Care Management (AMB) (Signed)
Care Management    RN Visit Note  06/04/2020 Name: [name] MRN: [id number] DOB: [birth year]  Subjective: [name] is a 45 y.o. year old male who is a primary care patient of Victor Mercury, MD. The care management team was consulted for assistance with disease management and care coordination needs.    Consent to Services:   [name] was given information about Care Management services today including:  1. Care Management services includes personalized support from designated clinical staff supervised by his physician, including individualized plan of care and coordination with other care providers 2. 24/7 contact phone numbers for assistance for urgent and routine care needs. 3. The patient may stop case management services at any time by phone call to the office staff.  Patient agreed to services and consent obtained.   Assessment: Review of patient past medical history, allergies, medications, health status, including review of consultants reports, laboratory and other test data, was performed as part of comprehensive evaluation and provision of chronic care management services.   SDOH (Social Determinants of Health) assessments and interventions performed:    Care Plan  Allergies  Allergen Reactions  . Reglan [Metoclopramide] Other (See Comments)    Dysphoric reaction  . Metformin And Related Diarrhea  . Ozempic (0.25 Or 0.5 Mg-Dose) [Semaglutide(0.25 Or 0.52m-Dos)] Nausea And Vomiting    Outpatient Encounter Medications as of 06/04/2020  Medication Sig Note  . Accu-Chek Softclix Lancets lancets Check blood sugar up to 3 times a day (Patient taking differently: 1 each by Other route See admin instructions. Check blood sugar up to 4-5 times a day)   . acidophilus (RISAQUAD) CAPS capsule Take 1 capsule by mouth daily.   . Alpha-Lipoic Acid 600 MG CAPS Take 600 mg by mouth daily.   .Marland KitchenamLODipine (NORVASC) 10 MG tablet Take 10 mg by mouth at bedtime.   .Marland Kitchenapixaban  (ELIQUIS) 5 MG TABS tablet Take 1 tablet (5 mg total) by mouth 2 (two) times daily.   . baclofen (LIORESAL) 10 MG tablet Take 0.5-1 tablets (5-10 mg total) by mouth daily for 21 days. Start 51mand increase to 1025mor additional relief if needed   . Blood Glucose Monitoring Suppl (ACCU-CHEK GUIDE ME) w/Device KIT Use to check blood sugar up to 3 times a day (Patient taking differently: 1 each by Other route See admin instructions. Use to check blood sugar up to 4-5 times a day)   . carvedilol (COREG) 25 MG tablet Take 1 tablet (25 mg total) by mouth 2 (two) times daily with a meal.   . Cyanocobalamin (B-12) 500 MCG SUBL Place 500 mcg under the tongue daily.    . famotidine (PEPCID) 20 MG tablet Take 1 tablet (20 mg total) by mouth 2 (two) times daily. (Patient taking differently: Take 20 mg by mouth as needed for heartburn or indigestion.) 11/28/2019: .  . fluticasone (FLONASE) 50 MCG/ACT nasal spray Place 2 sprays into both nostrils daily.   . furosemide (LASIX) 80 MG tablet Take 1 tablet by mouth twice daily 02/17/2020: Pt states he needs refills  . gabapentin (NEURONTIN) 100 MG capsule TAKE 1 CAPSULE BY MOUTH THREE TIMES DAILY   . glucose blood (ACCU-CHEK GUIDE) test strip Check blood sugar up to 3 times a day (Patient taking differently: 1 each by Other route See admin instructions. Check blood sugar up to 4-5 times a day)   . hydrALAZINE (APRESOLINE) 50 MG tablet TAKE 1 TABLET BY MOUTH THREE TIMES DAILY   . insulin  aspart (NOVOLOG) 100 UNIT/ML FlexPen Administer 5 units in the morning with breakfast, 20 units with lunch, and 25 units with dinner daily.  ICD Code:  E11.9   . insulin degludec (TRESIBA FLEXTOUCH) 200 UNIT/ML FlexTouch Pen INJECT 45 UNITS SUBCUTANEOUSLY ONCE DAILY   . Insulin Pen Needle (B-D UF III MINI PEN NEEDLES) 31G X 5 MM MISC 1 each by Other route See admin instructions. USE 4-5 TIMES DAILY   . Insulin Pen Needle (PEN NEEDLES 3/16") 31G X 5 MM MISC Use five times daily   .  Lancet Devices (ACCU-CHEK SOFTCLIX) lancets Use to check blood sugars 5 times a day. Dx code:E11.9. Insulin dependent. (Patient taking differently: 1 each by Other route See admin instructions. Use to check blood sugars 5 times a day. Dx code:E11.9. Insulin dependent.)   . lovastatin (MEVACOR) 20 MG tablet Take 1 tablet by mouth once daily   . Omega-3 Fatty Acids (FISH OIL) 1000 MG CAPS Take 1,000 mg by mouth at bedtime.    . ondansetron (ZOFRAN) 4 MG tablet Take 1 tablet (4 mg total) by mouth every 6 (six) hours as needed for nausea.   Marland Kitchen oxybutynin (DITROPAN) 5 MG tablet Take 5 mg by mouth 3 (three) times daily.    . polyethylene glycol (MIRALAX / GLYCOLAX) 17 g packet Take 17 g by mouth 2 (two) times daily. (Patient taking differently: Take 17 g by mouth as needed for mild constipation.)   . potassium chloride SA (KLOR-CON) 20 MEQ tablet Take 20 mEq by mouth 2 (two) times daily.   Marland Kitchen senna-docusate (SENOKOT-S) 8.6-50 MG tablet Take 1 tablet by mouth daily. (Patient taking differently: Take 1 tablet by mouth as needed for mild constipation.)   . sildenafil (VIAGRA) 50 MG tablet Take 50 mg by mouth as needed for erectile dysfunction.   . tamsulosin (FLOMAX) 0.4 MG CAPS capsule Take 0.4 mg by mouth at bedtime.    Marland Kitchen VICTOZA 18 MG/3ML SOPN INJECT 1.2 MG INTO THE SKIN ONCE DAILY AT THE SAME TIME.    No facility-administered encounter medications on file as of 06/04/2020.    Patient Active Problem List   Diagnosis Date Noted  . Trapezius strain, right, initial encounter 05/20/2020  . Anemia associated with chronic renal failure   . CKD (chronic kidney disease)   . Hemoglobinuria 12/12/2018  . OSA (obstructive sleep apnea) 02/28/2018  . Chronic diastolic heart failure ([place]) 02/02/2018  . Healthcare maintenance 08/15/2017  . Erectile dysfunction 04/22/2017  . History of pulmonary embolus (PE) 06/09/2016  . Depression 05/04/2016  . Hypogonadism in male 05/04/2016  . S/P AKA (above knee amputation)  unilateral, right ([place]) 05/04/2016  . Morbid obesity with BMI of 40.0-44.9, adult ([place]) 10/14/2015  . Dyslipidemia 08/12/2014  . Uncontrolled type 2 diabetes mellitus with hyperglycemia, with long-term current use of insulin ([place]) 08/12/2014  . Unilateral AKA, right ([place]) 12/26/2013  . Hyperlipidemia 02/19/2013  . S/P cardiac cath, 02/18/13, normal coronaries 02/19/2013  . Chronic renal insufficiency, stage 3 (moderate) ([place]) 02/19/2013  . Hypertension 07/24/2011  . Long term current use of anticoagulant therapy 07/19/2011  . History of DVT (deep vein thrombosis) 07/28/2009  . Morbid obesity ([place]) 10/26/2005  . Insulin dependent type 2 diabetes mellitus, controlled ([place]) 01/09/2002    Conditions to be addressed/monitored: IDDM, HF, HLD, HTN, CKD, s/p R AKA 02/20/14  Care Plan : Diabetes Type 2 (Adult)  Updates made by Barrington Ellison, RN since 06/04/2020 12:00 AM    Problem: Currently meeting A1C target  but having large fluctuations in blood sugars   Priority: High  Onset Date: 12/15/2019    Goal: Glycemic Management Optimized without large fluctuations in blood sugars   Start Date: 12/15/2019  Expected End Date: 07/08/2020  Recent Progress: On track  Priority: High  Note:   Objective:  Lab Results  Component Value Date   HGBA1C 6.0 04/15/2020   HGBA1C 8.0 (A) 03/04/2020   HGBA1C 5.1 11/28/2019   Lab Results  Component Value Date   MICROALBUR 160.61 (H) 08/14/2013   LDLCALC 60 01/15/2020   CREATININE 3.18 (H) 04/22/2020    Current Barriers:  Marland Kitchen Knowledge Deficits related to basic Diabetes pathophysiology and self care/management . Unable to independently meet pre and post meal CBG targets - successful outreach to patient. He states he continues to wear the Freestyle Libre flash glucose monitoring system and he loves it, he says the majority of his blood sugars are in range, says he saw Dr Dwyane Dee on 4/14 and adjustment were made to his DM medications, he says he did not receive a  call from [facility] about his request to replace the w/c that was delivered with one that will allow him to play w/c basketball  Case Manager Clinical Goal(s):  Marland Kitchen Over the next 30-60 days, patient will demonstrate improved adherence to prescribed treatment plan for diabetes self care/management as evidenced by:  . daily monitoring and recording of CBG  . adherence to ADA/ carb modified diet . exercise 5-7 days/week- adherence to prescribed medication regimen Interventions:  . Review of patient status, including review of consultants reports, relevant laboratory and other test results, and medications completed. . Provided education to patient about basic DM disease process and negative impact of highly fluctuating CBGs on all body systems . Assessed satisfaction with Freestyle Libre Flash Glucose monitor . Assessed frequency of interstitial fluid glucose readings  being in target . Discussed relationship of interstitial fluid glucose and capillary glucose . Reviewed Dr. Ronnie Derby office note of 4/14 in detail with patient including changes to DM medication . Reviewed medications with patient and discussed importance of good medication taking behavior . 4/24- per patient request e-mail sent to Dr J[facility] P[facility] requesting assistance in securing w/c for patient to allow him to play basket ball . 5/2- Secure e-mail sent to patient advising him there has been no response to e-mail sent to Dr Jone Baseman PT at [facility] on 4/24; suggested he contact Dr Jone Baseman since he was a former patient of hers.  . 05/18/20- Returned call to patient and advised this CCM RN did not receive reply to e-mail sent to Dr Jone Baseman at [facility]. Requested patient call Dr Jone Baseman to see if she can provide guidance with securing specialized w/c for patient. Patient agrees to call her. . 05/18/20- At patient's suggestion, messaged clinic RN Howell Rucks , requesting guidance with securing specialized w/c for  patient.  . 05/24/20- Received voice mail from patient stating he spoke with Dr Jamey Reas PT PHD and she suggested Stefano Gaul be contacted at [phone]. Left message for Ms Jimmye Norman requesting return call.  . 06/04/20- Successful outreach to Stefano Gaul at [facility] at [phone]; she suggested patient get in touch with the [employer] P: [phone] F: [phone] [email or web address] to assist him with securing a w/c that will allow him to play basketball  . 06/04/20- Contact information for [place] mailed to patient's home address with patient letter encouraging him to contact the  foundation . Patient encouraged to continue his exercise routine with his personal trainer . Positive  reinforcement given for his successful weight loss and consistency with personal training session . - A1C testing facilitated . - barriers to adherence to treatment plan identified . - dental care encouraged . - mutual A1C goal set or reviewed . - devices to improve adherence to care identified . - self-awareness of signs/symptoms of hypo or hyperglycemia encouraged . Discussed plans with patient for ongoing care management follow up and provided patient with direct contact information for care management team . Reviewed scheduled/upcoming provider appointments including: Aranesp injection on 3/17, for labs on 4/7 and with Dr Dwyane Dee on 4/14 . - A1C testing facilitated . - barriers to adherence to treatment plan identified . - dental care encouraged . - mutual A1C goal set or reviewed . - devices to improve adherence to care identified . - self-awareness of signs/symptoms of hypo or hyperglycemia encouraged . Discussed plans with patient for ongoing care management follow up and provided patient with direct contact information for care management team . In the next 30-60 days, patient will:  - Self administers oral medications as  prescribed Self administers insulin as prescribed Self administers injectable DM medication Victoza and Tyler Aas as prescribed Attends all scheduled provider appointments Checks blood sugars as prescribed and utilize hyper and hypoglycemia protocol as needed Adheres to prescribed ADA/carb modified Follow Up Plan: The care management team will reach out to the patient again over the next 30-60 days.       Kelli Churn RN, CCM, [facility] RN Care Manager [phone]

## 2020-06-07 ENCOUNTER — Other Ambulatory Visit: Payer: Self-pay | Admitting: Student

## 2020-06-10 ENCOUNTER — Ambulatory Visit (HOSPITAL_COMMUNITY)
Admission: RE | Admit: 2020-06-10 | Discharge: 2020-06-10 | Disposition: A | Discharge status: Home / Self Care | Payer: Medicare Other | Source: Ambulatory Visit | Attending: Nephrology | Admitting: Nephrology

## 2020-06-10 ENCOUNTER — Other Ambulatory Visit: Payer: Self-pay

## 2020-06-10 VITALS — BP 129/72 | HR 89 | Temp 98.3°F | Resp 18

## 2020-06-10 DIAGNOSIS — D631 Anemia in chronic kidney disease: Secondary | ICD-10-CM | POA: Insufficient documentation

## 2020-06-10 DIAGNOSIS — N183 Chronic kidney disease, stage 3 unspecified: Secondary | ICD-10-CM | POA: Diagnosis not present

## 2020-06-10 LAB — RENAL FUNCTION PANEL
Albumin: 3.2 g/dL — ABNORMAL LOW (ref 3.5–5.0)
Anion gap: 9 (ref 5–15)
BUN: 63 mg/dL — ABNORMAL HIGH (ref 6–20)
CO2: 23 mmol/L (ref 22–32)
Calcium: 8.8 mg/dL — ABNORMAL LOW (ref 8.9–10.3)
Chloride: 102 mmol/L (ref 98–111)
Creatinine, Ser: 3.33 mg/dL — ABNORMAL HIGH (ref 0.61–1.24)
GFR, Estimated: 23 mL/min — ABNORMAL LOW (ref >60)
Glucose, Bld: 139 mg/dL — ABNORMAL HIGH (ref 70–99)
Phosphorus: 3.3 mg/dL (ref 2.5–4.6)
Potassium: 4.4 mmol/L (ref 3.5–5.1)
Sodium: 134 mmol/L — ABNORMAL LOW (ref 135–145)

## 2020-06-10 LAB — IRON AND TIBC
Iron: 47 ug/dL (ref 45–182)
Saturation Ratios: 16 % — ABNORMAL LOW (ref 17.9–39.5)
TIBC: 288 ug/dL (ref 250–450)
UIBC: 241 ug/dL

## 2020-06-10 LAB — POCT HEMOGLOBIN-HEMACUE: Hemoglobin: 9.7 g/dL — ABNORMAL LOW (ref 13.0–17.0)

## 2020-06-10 LAB — FERRITIN: Ferritin: 326 ng/mL (ref 24–336)

## 2020-06-10 MED ORDER — DARBEPOETIN ALFA 200 MCG/0.4ML IJ SOSY: 200.0000 ug | PREFILLED_SYRINGE | INTRAMUSCULAR | Status: DC

## 2020-06-10 MED ORDER — DARBEPOETIN ALFA 200 MCG/0.4ML IJ SOSY
PREFILLED_SYRINGE | INTRAMUSCULAR | Status: AC
  Administered 2020-06-10: 200 ug via SUBCUTANEOUS
  Filled 2020-06-10: qty 0.4

## 2020-06-11 LAB — PTH, INTACT AND CALCIUM
Calcium, Total (PTH): 8.9 mg/dL (ref 8.7–10.2)
PTH: 87 pg/mL — ABNORMAL HIGH (ref 15–65)

## 2020-06-14 ENCOUNTER — Other Ambulatory Visit: Payer: Self-pay | Admitting: Student

## 2020-06-15 DIAGNOSIS — H3562 Retinal hemorrhage, left eye: Secondary | ICD-10-CM | POA: Diagnosis not present

## 2020-06-15 DIAGNOSIS — H35033 Hypertensive retinopathy, bilateral: Secondary | ICD-10-CM | POA: Diagnosis not present

## 2020-06-15 DIAGNOSIS — E113313 Type 2 diabetes mellitus with moderate nonproliferative diabetic retinopathy with macular edema, bilateral: Secondary | ICD-10-CM | POA: Diagnosis not present

## 2020-06-15 DIAGNOSIS — H348321 Tributary (branch) retinal vein occlusion, left eye, with retinal neovascularization: Secondary | ICD-10-CM | POA: Diagnosis not present

## 2020-06-15 DIAGNOSIS — H3581 Retinal edema: Secondary | ICD-10-CM | POA: Diagnosis not present

## 2020-06-15 LAB — HM DIABETES EYE EXAM

## 2020-06-25 DIAGNOSIS — I129 Hypertensive chronic kidney disease with stage 1 through stage 4 chronic kidney disease, or unspecified chronic kidney disease: Secondary | ICD-10-CM | POA: Diagnosis not present

## 2020-06-25 DIAGNOSIS — E669 Obesity, unspecified: Secondary | ICD-10-CM | POA: Diagnosis not present

## 2020-06-25 DIAGNOSIS — E876 Hypokalemia: Secondary | ICD-10-CM | POA: Diagnosis not present

## 2020-06-25 DIAGNOSIS — N2581 Secondary hyperparathyroidism of renal origin: Secondary | ICD-10-CM | POA: Diagnosis not present

## 2020-06-25 DIAGNOSIS — N184 Chronic kidney disease, stage 4 (severe): Secondary | ICD-10-CM | POA: Diagnosis not present

## 2020-06-28 ENCOUNTER — Ambulatory Visit: Payer: Medicare Other | Admitting: *Deleted

## 2020-06-28 DIAGNOSIS — E119 Type 2 diabetes mellitus without complications: Secondary | ICD-10-CM

## 2020-06-28 DIAGNOSIS — I10 Essential (primary) hypertension: Secondary | ICD-10-CM

## 2020-06-28 NOTE — Patient Instructions (Signed)
Visit Information It was nice speaking with you today.   Goals Addressed               This Visit's Progress     Patient Stated     COMPLETED: " I don't have any scales and Dr. Radford Pax told me she would help me get a CPAP machine." (pt-stated)        CARE PLAN ENTRY (see longitudinal plan of care for additional care plan information)  Current Barriers:  Care Coordination needs related to DME  in a patient with IDDM, HF, HLD, HTN, CKD- spoke with patient states he completed the sleep study on 10/20 but has not been contacted about obtaining CPAP machine  Nurse Case Manager Clinical Goal(s):  Over the next 30-90  days, patient will work with CCM team to address needs related to home scales  and CPAP  Interventions:  Inter-disciplinary care team collaboration (see longitudinal plan of care)  Reviewed findings of 10/29/19 sleep study and advised patient he does have a diagnosis of OSA and that Dr Radford Pax wrote an order for CPAP on 10/30/19 Encouraged patient to call Dr Theodosia Blender office for follow up on how to obtain CPAP machine  Patient Self Care Activities:  Patient verbalizes understanding of plan to work with CCM team to secure DME of CPAP and scales Self administers medications as prescribed Attends all scheduled provider appointments Calls pharmacy for medication refills ADL's performed with PCS assist IADL's performed with PCS assist Calls provider office for new concerns or questions Unable to independently secure scales and CPAP  Please see past updates related to this goal by clicking on the "Past Updates" button in the selected goal         COMPLETED: "I want to improve my kidney health." (pt-stated)        CARE PLAN ENTRY (see longitudinal plan of care for additional care plan information)  Current Barriers:  Chronic Disease Management support, education, and care coordination needs related to CHF, HTN, HLD, DMII, and CKD Stage 3 - spoke with patient to complete  follow up assessment, he says he was referred by his nephrologist to Dr Donzetta Matters to discuss vascular access procedure, patient states his ultimate goal is to get his BMI to <35 so he might qualify for a renal transplant, he voiced great appreciation for the electronic scales that were given to him by this CCM RN on 09/24/19, states "they are the best thing since sliced bread", he remains  motivated to improve his kidney health, he says he follows a low CHO, healthy protein diet, exercises 6 days a week at a gym (despite right AKA) for 60 minute sessions that include cardio and strengthening and working with a Physiological scientist, he says he has an appointment with [employer] this month to have his prosthetic sleeve refitted due to his weight loss, says he uses a walker and/or Rolator to ambulate, denies falls  Clinical Goal(s) related to CHF, HTN, HLD, DMII, and CKD Stage 3 :  Over the next 30-60 days, patient will:  Work with the care management team to address educational, disease management, and care coordination needs  Begin or continue self health monitoring activities as directed today Measure and record CBG (blood glucose) 1-3 times daily, Measure and record blood pressure 4-5 times per week, and voice knowledge of strategies to improve kidney function Call provider office for new or worsened signs and symptoms Blood glucose findings outside established parameters, Blood pressure findings outside established parameters, Chest pain,  Shortness of breath, and New or worsened symptom related to IDDM, HTN , CKD  Call care management team with questions or concerns Verbalize basic understanding of patient centered plan of care established today  Interventions related to CHF, HTN, HLD, DMII, and CKD Stage 3 :  Prior to interviewing patient, reviewed patient status, including review of consultants reports, relevant laboratory and other test results, and medications, completed. Evaluation of current treatment plans  and patient's adherence to plan as established by provider Assessed patient understanding of disease states Assessed patient's education and care coordination needs Congratulate patient on his significant weight loss and improvement in his Hgb A1C to 5.9% on 08/30/19 Collaborated with appropriate clinical care team members regarding patient needs  Patient Self Care Activities related to CHF, HTN, DMII, and CKD Stage 3 :  Patient is unable to independently self-manage chronic health conditions  Please see past updates related to this goal by clicking on the "Past Updates" button in the selected goal            COMPLETED: "Maintain target A1C of <7.0% without large fluctuations in blood sugar" (pt-stated)        Timeframe:  Long-Range Goal Priority:  High Start Date:     12/15/19                        Expected End Date:  04/09/19                       - set target A1C         Other     Monitor and Manage My Blood Sugar-Diabetes Type 2        Timeframe:  Long-Range Goal Priority:  High Start Date:       12/15/19                      Expected End Date:   ongoing            - check blood sugar at prescribed times - check blood sugar before and after exercise - check blood sugar if I feel it is too high or too low - enter blood sugar readings and medication or insulin into daily log - take the blood sugar meter to all doctor visits    06/28/20- patient continues to wear Freestyle Libre flash glucose monitoring system and "loves it" , time in range = 70% per Dr Ronnie Derby note of 04/22/20, patient reports occasional low blood sugar that he is able to self treat with [name]         The patient verbalized understanding of instructions, educational materials, and care plan provided today and declined offer to receive copy of patient instructions, educational materials, and care plan.   The care management team will reach out to the patient again over the next 30 days.   Kelli Churn RN, CCM, [facility] RN Care Manager [phone]

## 2020-06-28 NOTE — Chronic Care Management (AMB) (Signed)
Care Management    RN Visit Note  06/28/2020 Name: [name] MRN: [id number] DOB: [birth year]  Subjective: [name] is a 45 y.o. year old male who is a primary care patient of [name], MD. The care management team was consulted for assistance with disease management and care coordination needs.    Engaged with patient by telephone for follow up visit in response to provider referral for case management and/or care coordination services.   Consent to Services:   [name] was given information about Care Management services today including:  Care Management services includes personalized support from designated clinical staff supervised by his physician, including individualized plan of care and coordination with other care providers 24/7 contact phone numbers for assistance for urgent and routine care needs. The patient may stop case management services at any time by phone call to the office staff.  Patient agreed to services and consent obtained.   Assessment: Review of patient past medical history, allergies, medications, health status, including review of consultants reports, laboratory and other test data, was performed as part of comprehensive evaluation and provision of chronic care management services.   SDOH (Social Determinants of Health) assessments and interventions performed:    Care Plan  Allergies  Allergen Reactions   Reglan [Metoclopramide] Other (See Comments)    Dysphoric reaction   Metformin And Related Diarrhea   Ozempic (0.25 Or 0.5 Mg-Dose) [Semaglutide(0.25 Or 0.59m-Dos)] Nausea And Vomiting    Outpatient Encounter Medications as of 06/28/2020  Medication Sig Note   ELIQUIS 5 MG TABS tablet Take 1 tablet by mouth twice daily    Accu-Chek Softclix Lancets lancets Check blood sugar up to 3 times a day (Patient taking differently: 1 each by Other route See admin instructions. Check blood sugar up to 4-5 times a day)    acidophilus (RISAQUAD)  CAPS capsule Take 1 capsule by mouth daily.    Alpha-Lipoic Acid 600 MG CAPS Take 600 mg by mouth daily.    amLODipine (NORVASC) 10 MG tablet Take 10 mg by mouth at bedtime.    Blood Glucose Monitoring Suppl (ACCU-CHEK GUIDE ME) w/Device KIT Use to check blood sugar up to 3 times a day (Patient taking differently: 1 each by Other route See admin instructions. Use to check blood sugar up to 4-5 times a day)    carvedilol (COREG) 25 MG tablet Take 1 tablet (25 mg total) by mouth 2 (two) times daily with a meal.    Cyanocobalamin (B-12) 500 MCG SUBL Place 500 mcg under the tongue daily.     famotidine (PEPCID) 20 MG tablet Take 1 tablet (20 mg total) by mouth 2 (two) times daily. (Patient taking differently: Take 20 mg by mouth as needed for heartburn or indigestion.) 11/28/2019: .   fluticasone (FLONASE) 50 MCG/ACT nasal spray Place 2 sprays into both nostrils daily.    furosemide (LASIX) 80 MG tablet Take 1 tablet by mouth twice daily 02/17/2020: Pt states he needs refills   gabapentin (NEURONTIN) 100 MG capsule TAKE 1 CAPSULE BY MOUTH THREE TIMES DAILY    glucose blood (ACCU-CHEK GUIDE) test strip Check blood sugar up to 3 times a day (Patient taking differently: 1 each by Other route See admin instructions. Check blood sugar up to 4-5 times a day)    hydrALAZINE (APRESOLINE) 50 MG tablet TAKE 1 TABLET BY MOUTH THREE TIMES DAILY    insulin aspart (NOVOLOG) 100 UNIT/ML FlexPen Administer 5 units in the morning with breakfast, 20 units with lunch,  and 25 units with dinner daily.  ICD Code:  E11.9    insulin degludec (TRESIBA FLEXTOUCH) 200 UNIT/ML FlexTouch Pen INJECT 45 UNITS SUBCUTANEOUSLY ONCE DAILY    Insulin Pen Needle (B-D UF III MINI PEN NEEDLES) 31G X 5 MM MISC 1 each by Other route See admin instructions. USE 4-5 TIMES DAILY    Insulin Pen Needle (PEN NEEDLES 3/16") 31G X 5 MM MISC Use five times daily    Lancet Devices (ACCU-CHEK SOFTCLIX) lancets Use to check blood sugars 5 times a day. Dx  code:E11.9. Insulin dependent. (Patient taking differently: 1 each by Other route See admin instructions. Use to check blood sugars 5 times a day. Dx code:E11.9. Insulin dependent.)    lovastatin (MEVACOR) 20 MG tablet Take 1 tablet by mouth once daily    Omega-3 Fatty Acids (FISH OIL) 1000 MG CAPS Take 1,000 mg by mouth at bedtime.     ondansetron (ZOFRAN) 4 MG tablet Take 1 tablet (4 mg total) by mouth every 6 (six) hours as needed for nausea.    oxybutynin (DITROPAN) 5 MG tablet Take 5 mg by mouth 3 (three) times daily.     polyethylene glycol (MIRALAX / GLYCOLAX) 17 g packet Take 17 g by mouth 2 (two) times daily. (Patient taking differently: Take 17 g by mouth as needed for mild constipation.)    potassium chloride SA (KLOR-CON) 20 MEQ tablet Take 20 mEq by mouth 2 (two) times daily.    senna-docusate (SENOKOT-S) 8.6-50 MG tablet Take 1 tablet by mouth daily. (Patient taking differently: Take 1 tablet by mouth as needed for mild constipation.)    sildenafil (VIAGRA) 50 MG tablet Take 50 mg by mouth as needed for erectile dysfunction.    tamsulosin (FLOMAX) 0.4 MG CAPS capsule Take 0.4 mg by mouth at bedtime.     VICTOZA 18 MG/3ML SOPN INJECT 1.2 MG INTO THE SKIN ONCE DAILY AT THE SAME TIME.    No facility-administered encounter medications on file as of 06/28/2020.    Patient Active Problem List   Diagnosis Date Noted   Trapezius strain, right, initial encounter 05/20/2020   Anemia associated with chronic renal failure    CKD (chronic kidney disease)    Hemoglobinuria 12/12/2018   OSA (obstructive sleep apnea) 02/28/2018   Chronic diastolic heart failure ([place]) 02/02/2018   Healthcare maintenance 08/15/2017   Erectile dysfunction 04/22/2017   History of pulmonary embolus (PE) 06/09/2016   Depression 05/04/2016   Hypogonadism in male 05/04/2016   S/P AKA (above knee amputation) unilateral, right ([place]) 05/04/2016   Morbid obesity with BMI of 40.0-44.9, adult ([place]) 10/14/2015    Dyslipidemia 08/12/2014   Uncontrolled type 2 diabetes mellitus with hyperglycemia, with long-term current use of insulin ([place]) 08/12/2014   Unilateral AKA, right ([place]) 12/26/2013   Hyperlipidemia 02/19/2013   S/P cardiac cath, 02/18/13, normal coronaries 02/19/2013   Chronic renal insufficiency, stage 3 (moderate) ([place]) 02/19/2013   Hypertension 07/24/2011   Long term current use of anticoagulant therapy 07/19/2011   History of DVT (deep vein thrombosis) 07/28/2009   Morbid obesity ([place]) 10/26/2005   Insulin dependent type 2 diabetes mellitus, controlled ([place]) 01/09/2002    Conditions to be addressed/monitored: IDDM, HF, HLD, HTN, CKD, s/p R AKA 02/20/14  Care Plan : Diabetes Type 2 (Adult)  Updates made by Barrington Ellison, RN since 06/28/2020 12:00 AM     Problem: Currently meeting A1C target but having large fluctuations in blood sugars   Priority: High  Onset Date: 12/15/2019  Goal: Glycemic Management Optimized without large fluctuations in blood sugars   Start Date: 12/15/2019  Expected End Date: 10/08/2020  Recent Progress: On track  Priority: High  Note:   Objective:  Lab Results  Component Value Date   HGBA1C 6.0 04/15/2020   HGBA1C 8.0 (A) 03/04/2020   HGBA1C 5.1 11/28/2019   Lab Results  Component Value Date   MICROALBUR 160.61 (H) 08/14/2013   LDLCALC 60 01/15/2020   CREATININE 3.18 (H) 04/22/2020    Current Barriers:  Knowledge Deficits related to basic Diabetes pathophysiology and self care/management Unable to independently meet pre and post meal CBG targets - successful telephone outreach to patient to complete follow up assessment. He states he received the information that was mailed to him by this CCM RN and called the [employer] and left a message stating he wishes to apply for a grant to secure a w/c that will allow him to play w/c basketball but did not receive a return call so he says he will call again today. He states he continues  to wear the [employer] flash glucose monitoring system and he loves it, he says the majority of his blood sugars are in range, does report some occasional lows in low 60s, high 50's that he is able to self treat with [facility], says he will see Dr Dwyane Dee on 7/15, he is requesting a quad cane, a new prothesis and a refill on his Gabapentin, says his weight has plateaued because he is gaining muscle weight   Case Manager Clinical Goal(s):  Over the next 30-60 days, patient will demonstrate improved adherence to prescribed treatment plan for diabetes self care/management as evidenced by:  daily monitoring and recording of CBG  adherence to ADA/ carb modified diet exercise 5-7 days/week- adherence to prescribed medication regimen Interventions:  Review of patient status, including review of consultants reports, relevant laboratory and other test results, and medications completed. Assessed satisfaction with [employer] Flash Glucose monitor Assessed frequency of hypoglycemia and ability to self treat Assessed medication taking behavior 06/04/20- Contact information for [employer] mailed to patient's home address with patient letter encouraging him to contact the foundation Patient encouraged to continue his exercise routine with his personal trainer Positive  reinforcement given for his successful weight loss and consistency with personal training session - A1C testing facilitated - barriers to adherence to treatment plan identified - dental care encouraged - mutual A1C goal set or reviewed - devices to improve adherence to care identified - self-awareness of signs/symptoms of hypo or hyperglycemia encouraged Discussed plans with patient for ongoing care management follow up and provided patient with direct contact information for care management team Reviewed scheduled/upcoming provider appointments including: Aranesp injection on 6/30, for labs at [facility]  on 7/8 and with Dr. Dwyane Dee on 7/15 - A1C testing facilitated - barriers to adherence to treatment plan identified - dental care encouraged - mutual A1C goal set or reviewed - devices to improve adherence to care identified - self-awareness of signs/symptoms of hypo or hyperglycemia encouraged Discussed plans with patient for ongoing care management follow up and provided patient with direct contact information for care management team In the next 30-60 days, patient will:  - Self administers oral medications as prescribed Self administers insulin as prescribed Self administers injectable DM medication Victoza and Tyler Aas as prescribed Attends all scheduled provider appointments Checks blood sugars as prescribed and utilize hyper and hypoglycemia protocol as needed Adheres to prescribed ADA/carb modified Follow Up Plan: The  care management team will reach out to the patient again over the next 30-60 days.       Plan: The care management team will reach out to the patient again over the next 30 days.  Kelli Churn RN, CCM, [facility] RN Care Manager [phone]

## 2020-07-02 ENCOUNTER — Other Ambulatory Visit: Payer: Self-pay | Admitting: Endocrinology

## 2020-07-02 ENCOUNTER — Other Ambulatory Visit: Payer: Self-pay | Admitting: Internal Medicine

## 2020-07-02 DIAGNOSIS — Z89611 Acquired absence of right leg above knee: Secondary | ICD-10-CM

## 2020-07-05 ENCOUNTER — Ambulatory Visit: Payer: Medicare Other | Admitting: *Deleted

## 2020-07-05 DIAGNOSIS — E119 Type 2 diabetes mellitus without complications: Secondary | ICD-10-CM

## 2020-07-05 DIAGNOSIS — Z794 Long term (current) use of insulin: Secondary | ICD-10-CM

## 2020-07-05 DIAGNOSIS — Z89611 Acquired absence of right leg above knee: Secondary | ICD-10-CM

## 2020-07-05 DIAGNOSIS — E785 Hyperlipidemia, unspecified: Secondary | ICD-10-CM

## 2020-07-05 DIAGNOSIS — I10 Essential (primary) hypertension: Secondary | ICD-10-CM

## 2020-07-05 NOTE — Chronic Care Management (AMB) (Signed)
Care Management    RN Visit Note  07/05/2020 Name: [name] MRN: [id number] DOB: 04/19/1975  Subjective: [name] is a 45 y.o. year old male who is a primary care patient of [name], MD. The care management team was consulted for assistance with disease management and care coordination needs.  Consent to Services:   [name] was given information about Care Management services today including:  Care Management services includes personalized support from designated clinical staff supervised by his physician, including individualized plan of care and coordination with other care providers 24/7 contact phone numbers for assistance for urgent and routine care needs. The patient may stop case management services at any time by phone call to the office staff.  Patient agreed to services and consent obtained.   Assessment: Review of patient past medical history, allergies, medications, health status, including review of consultants reports, laboratory and other test data, was performed as part of comprehensive evaluation and provision of chronic care management services.   SDOH (Social Determinants of Health) assessments and interventions performed:    Care Plan  Allergies  Allergen Reactions   Reglan [Metoclopramide] Other (See Comments)    Dysphoric reaction   Metformin And Related Diarrhea   Ozempic (0.25 Or 0.5 Mg-Dose) [Semaglutide(0.25 Or 0.41m-Dos)] Nausea And Vomiting    Outpatient Encounter Medications as of 07/05/2020  Medication Sig Note   ELIQUIS 5 MG TABS tablet Take 1 tablet by mouth twice daily    Accu-Chek Softclix Lancets lancets Check blood sugar up to 3 times a day (Patient taking differently: 1 each by Other route See admin instructions. Check blood sugar up to 4-5 times a day)    acidophilus (RISAQUAD) CAPS capsule Take 1 capsule by mouth daily.    Alpha-Lipoic Acid 600 MG CAPS Take 600 mg by mouth daily.    amLODipine (NORVASC) 10 MG tablet Take  10 mg by mouth at bedtime.    Blood Glucose Monitoring Suppl (ACCU-CHEK GUIDE ME) w/Device KIT Use to check blood sugar up to 3 times a day (Patient taking differently: 1 each by Other route See admin instructions. Use to check blood sugar up to 4-5 times a day)    carvedilol (COREG) 25 MG tablet Take 1 tablet (25 mg total) by mouth 2 (two) times daily with a meal.    Cyanocobalamin (B-12) 500 MCG SUBL Place 500 mcg under the tongue daily.     famotidine (PEPCID) 20 MG tablet Take 1 tablet (20 mg total) by mouth 2 (two) times daily. (Patient taking differently: Take 20 mg by mouth as needed for heartburn or indigestion.) 11/28/2019: .   fluticasone (FLONASE) 50 MCG/ACT nasal spray Place 2 sprays into both nostrils daily.    furosemide (LASIX) 80 MG tablet Take 1 tablet by mouth twice daily 02/17/2020: Pt states he needs refills   gabapentin (NEURONTIN) 100 MG capsule TAKE 1 CAPSULE BY MOUTH THREE TIMES DAILY    glucose blood (ACCU-CHEK GUIDE) test strip Check blood sugar up to 3 times a day (Patient taking differently: 1 each by Other route See admin instructions. Check blood sugar up to 4-5 times a day)    hydrALAZINE (APRESOLINE) 50 MG tablet TAKE 1 TABLET BY MOUTH THREE TIMES DAILY    insulin aspart (NOVOLOG) 100 UNIT/ML FlexPen Administer 5 units in the morning with breakfast, 20 units with lunch, and 25 units with dinner daily.  ICD Code:  E11.9    insulin degludec (TRESIBA FLEXTOUCH) 200 UNIT/ML FlexTouch Pen INJECT 45 UNITS  SUBCUTANEOUSLY ONCE DAILY    Insulin Pen Needle (B-D UF III MINI PEN NEEDLES) 31G X 5 MM MISC 1 each by Other route See admin instructions. USE 4-5 TIMES DAILY    Insulin Pen Needle (PEN NEEDLES 3/16") 31G X 5 MM MISC Use five times daily    Lancet Devices (ACCU-CHEK SOFTCLIX) lancets Use to check blood sugars 5 times a day. Dx code:E11.9. Insulin dependent. (Patient taking differently: 1 each by Other route See admin instructions. Use to check blood sugars 5 times a day. Dx  code:E11.9. Insulin dependent.)    lovastatin (MEVACOR) 20 MG tablet Take 1 tablet by mouth once daily    Omega-3 Fatty Acids (FISH OIL) 1000 MG CAPS Take 1,000 mg by mouth at bedtime.     ondansetron (ZOFRAN) 4 MG tablet Take 1 tablet (4 mg total) by mouth every 6 (six) hours as needed for nausea.    oxybutynin (DITROPAN) 5 MG tablet Take 5 mg by mouth 3 (three) times daily.     polyethylene glycol (MIRALAX / GLYCOLAX) 17 g packet Take 17 g by mouth 2 (two) times daily. (Patient taking differently: Take 17 g by mouth as needed for mild constipation.)    potassium chloride SA (KLOR-CON) 20 MEQ tablet Take 20 mEq by mouth 2 (two) times daily.    senna-docusate (SENOKOT-S) 8.6-50 MG tablet Take 1 tablet by mouth daily. (Patient taking differently: Take 1 tablet by mouth as needed for mild constipation.)    sildenafil (VIAGRA) 50 MG tablet Take 50 mg by mouth as needed for erectile dysfunction.    tamsulosin (FLOMAX) 0.4 MG CAPS capsule Take 0.4 mg by mouth at bedtime.     VICTOZA 18 MG/3ML SOPN INJECT 1.2 MG INTO THE SKIN ONCE DAILY AT THE SAME TIME.    No facility-administered encounter medications on file as of 07/05/2020.    Patient Active Problem List   Diagnosis Date Noted   Trapezius strain, right, initial encounter 05/20/2020   Anemia associated with chronic renal failure    CKD (chronic kidney disease)    Hemoglobinuria 12/12/2018   OSA (obstructive sleep apnea) 02/28/2018   Chronic diastolic heart failure ([place]) 02/02/2018   Healthcare maintenance 08/15/2017   Erectile dysfunction 04/22/2017   History of pulmonary embolus (PE) 06/09/2016   Depression 05/04/2016   Hypogonadism in male 05/04/2016   S/P AKA (above knee amputation) unilateral, right ([place]) 05/04/2016   Morbid obesity with BMI of 40.0-44.9, adult ([place]) 10/14/2015   Dyslipidemia 08/12/2014   Uncontrolled type 2 diabetes mellitus with hyperglycemia, with long-term current use of insulin ([place]) 08/12/2014   Unilateral AKA,  right ([place]) 12/26/2013   Hyperlipidemia 02/19/2013   S/P cardiac cath, 02/18/13, normal coronaries 02/19/2013   Chronic renal insufficiency, stage 3 (moderate) ([place]) 02/19/2013   Hypertension 07/24/2011   Long term current use of anticoagulant therapy 07/19/2011   History of DVT (deep vein thrombosis) 07/28/2009   Morbid obesity ([place]) 10/26/2005   Insulin dependent type 2 diabetes mellitus, controlled ([place]) 01/09/2002    Conditions to be addressed/monitored: IDDM, HF, HLD, HTN, CKD, s/p R AKA 02/20/14  Care Plan : General Plan of Care (Adult)- DME needs of new prostheiss and quad cane and w/c that will enablepatient ot play w/c basketball  Updates made by Barrington Ellison, RN since 07/05/2020 12:00 AM     Problem: DME needs   Priority: High  Onset Date: 06/29/2020     Goal: Patient will have DME needs met   Start Date: 06/29/2020  Expected End Date: 10/08/2020  This Visit's Progress: On track  Priority: High  Note:   Current Barriers:  Care Coordination needs related to needed DME/Supplies in a patient with IDDM, HF, HLD, HTN, CKD, s/p R AKA 02/20/14 Care Coordination needs related to DME needs in a patient with IDDM, HF, HLD, HTN, CKD, s/p R AKA 02/20/14  Nurse Case Manager Clinical Goal(s):  Over the next 30-90 days, patient will verbalize understanding of plan to obtain needed DME.  Over the next 30-90 days, patient will work with care guide and/or [employer]  and the [employer] to address needs related to DME needs.  Interventions:  Inter-disciplinary care team collaboration (see longitudinal plan of care) Collaboration with/confirmation of receipt of DME orders at [employer]  and the [place] patient to call the [employer] again to request grant information for basketball w/c [employer] with [employer] representatives  regarding securing quad cane for patient Spoke with [employer] to determine requirements to secure new right  AKA prosthesis  Notified provider of need for face to face assessment in clinic to document need for new prosthesis collaboration with PCP regarding development and update of comprehensive plan of care as evidenced by provider attestation and co-signature Inter-disciplinary care team collaboration (see longitudinal plan of care) Patient Self Care Activities:  Patient will self administer medications as prescribed Patient will attend all scheduled provider appointments Patient will call pharmacy for medication refills Patient will call provider office for new concerns or questions Patient will follow up on referrals provided to secure specialized w/c  Unable to independently secure DME  Initial goal documentation      Plan: The care management team will reach out to the patient again over the next 30 days.  Kelli Churn RN, CCM, [facility] RN Care Manager [phone]

## 2020-07-08 ENCOUNTER — Other Ambulatory Visit: Payer: Self-pay

## 2020-07-08 ENCOUNTER — Telehealth: Payer: Self-pay | Admitting: *Deleted

## 2020-07-08 ENCOUNTER — Encounter (HOSPITAL_COMMUNITY): Payer: Medicare Other

## 2020-07-08 ENCOUNTER — Encounter: Payer: Self-pay | Admitting: Student

## 2020-07-08 ENCOUNTER — Ambulatory Visit (HOSPITAL_COMMUNITY)
Admission: RE | Admit: 2020-07-08 | Discharge: 2020-07-08 | Disposition: A | Discharge status: Home / Self Care | Payer: Medicare Other | Source: Ambulatory Visit | Attending: Nephrology | Admitting: Nephrology

## 2020-07-08 ENCOUNTER — Ambulatory Visit (INDEPENDENT_AMBULATORY_CARE_PROVIDER_SITE_OTHER): Payer: Medicare Other | Admitting: Student

## 2020-07-08 VITALS — BP 150/78 | HR 84 | Temp 97.8°F | Resp 18

## 2020-07-08 VITALS — BP 127/69 | HR 79 | Temp 98.1°F | Ht 74.0 in | Wt 342.2 lb

## 2020-07-08 DIAGNOSIS — N183 Chronic kidney disease, stage 3 unspecified: Secondary | ICD-10-CM | POA: Diagnosis not present

## 2020-07-08 DIAGNOSIS — E119 Type 2 diabetes mellitus without complications: Secondary | ICD-10-CM | POA: Diagnosis not present

## 2020-07-08 DIAGNOSIS — M25552 Pain in left hip: Secondary | ICD-10-CM

## 2020-07-08 DIAGNOSIS — Z794 Long term (current) use of insulin: Secondary | ICD-10-CM

## 2020-07-08 DIAGNOSIS — E113312 Type 2 diabetes mellitus with moderate nonproliferative diabetic retinopathy with macular edema, left eye: Secondary | ICD-10-CM | POA: Diagnosis not present

## 2020-07-08 DIAGNOSIS — S78111A Complete traumatic amputation at level between right hip and knee, initial encounter: Secondary | ICD-10-CM

## 2020-07-08 LAB — POCT GLYCOSYLATED HEMOGLOBIN (HGB A1C): Hemoglobin A1C: 5.5 % (ref 4.0–5.6)

## 2020-07-08 LAB — POCT HEMOGLOBIN-HEMACUE: Hemoglobin: 9.5 g/dL — ABNORMAL LOW (ref 13.0–17.0)

## 2020-07-08 LAB — GLUCOSE, CAPILLARY: Glucose-Capillary: 124 mg/dL — ABNORMAL HIGH (ref 70–99)

## 2020-07-08 MED ORDER — DARBEPOETIN ALFA 200 MCG/0.4ML IJ SOSY
PREFILLED_SYRINGE | INTRAMUSCULAR | Status: AC
  Filled 2020-07-08: qty 0.4

## 2020-07-08 MED ORDER — DARBEPOETIN ALFA 200 MCG/0.4ML IJ SOSY
200.0000 ug | PREFILLED_SYRINGE | INTRAMUSCULAR | Status: DC
  Administered 2020-07-08: 200 ug via SUBCUTANEOUS

## 2020-07-08 MED ORDER — BACLOFEN 10 MG PO TABS: 10.0000 mg | ORAL_TABLET | Freq: Every day | ORAL | 0 refills | Status: AC

## 2020-07-08 NOTE — Telephone Encounter (Signed)
Information was faxed through [employer] for [employer].  Awaiting determination within 72 hours.  Sander Nephew, RN 06/11/3020 3:40 PM.

## 2020-07-08 NOTE — Patient Instructions (Signed)
It was a pleasure seeing you in clinic. Today we discussed:   Leg prosthesis: I have placed a order for DME and you should be contacted about this  Low blood sugar: Please decrease your tresiba to 40 units daily on days you work out to prevent lows and follow up with endocrinology  Left hip pain: Please refrain from heavy lifting. Use tylenol, heat pads, and topicals as needed for pain. You can also take Baclofen 5-10 mg daily as needed for pain as well. Please work with you trainer to avoid leg exercises until the pain improves  If you have any questions or concerns, please call our clinic at [phone] between 9am-5pm and after hours call [phone] and ask for the internal medicine resident on call. If you feel you are having a medical emergency please call 911.   Thank you, we look forward to helping you remain healthy!

## 2020-07-10 DIAGNOSIS — M25552 Pain in left hip: Secondary | ICD-10-CM | POA: Insufficient documentation

## 2020-07-10 NOTE — Assessment & Plan Note (Signed)
Patient presents with left hip pain after doing squats in the gym. Notes he works with trainer 6 days a week and feels he may be strained his muscle while exercising. Has continued to work out daily due to wanted to lose weight and increase strength. On exam left inguinal area is TTP with ROM limited secondary to pain. Distal pulses intake, no sensory changes. No obvious lesions or bruising. Likely MSK injury from working out. Patient is adamant that he continue daily workouts. Discuss working with trainer to modify workouts to offload the hip while it heals. Patient is agreeable.  Plan Baclofen -10 mg as needed for pain Tylenol as needed for pain, OTC topicals, heat/ice  Patient will try to limited strenuous activity involving the hips

## 2020-07-10 NOTE — Progress Notes (Signed)
CC: right leg prosthesis length    HPI:  [name] is a 45 y.o. male with history below presents for concerns that his prosthesis is too long. Please refer to problem based charting for further details and assessment and plan of current problem and chronic medical conditions.   Past Medical History:  Diagnosis Date   Acute venous embolism and thrombosis of deep vessels of proximal lower extremity (HCC) 07/19/2011   Anemia    Anginal pain (HCC)    pt denies   Anxiety    Chest pain, neg MI, normal coronaries by cath 02/18/2013   pt denies   CHF (congestive heart failure) (HCC)    CKD (chronic kidney disease) stage 3, GFR 30-59 ml/min ([place]) 02/19/2013   Colon polyps    Depression    Diabetic ulcer of right foot ([place])    DVT (deep venous thrombosis) ([place]) 09/2002   patient reports additional DVTs in '06 & '11 (unconfirmed)   ED (erectile dysfunction)    GERD (gastroesophageal reflux disease)    History of blood transfusion    "related to OR" (10/31/2016)   Hyperlipidemia 02/19/2013   Hypertension    Nausea & vomiting    "constant for the last couple weeks" (10/31/2016)   Nephrotic syndrome    Obesity    BMI 44, weight 346 pounds 01/30/14   OSA (obstructive sleep apnea) 02/28/2018   Mild obstructive sleep apnea with an AHI of 9.8/h but severe during REM sleep with an AHI of 33.8/h.  Oxygen saturations dropped to 86% and there was moderate snoring   Peripheral edema    Pneumonia    Prosthesis adjustment 08/17/2016   Pulmonary embolism ([place]) 09/2002   treated with 6 months of warfarin   Pyelonephritis 02/02/2018   Type I diabetes mellitus ([place]) dx'd 2001   Urinary retention    Review of Systems:  negative as per HPI  Physical Exam:  Vitals:   07/08/20 1446 07/08/20 1448  BP: 127/69   Pulse: 79   Temp: 98.1 F (36.7 C)   TempSrc: Oral   SpO2: 97%   Weight:  (!) 342 lb 3.2 oz (155.2 kg)  Height: 6\' 2"  (1.88 m) 6\' 2"  (1.88 m)   Physical Exam Constitutional:       Appearance: He is obese.  HENT:     Head: Normocephalic and atraumatic.     Nose: Nose normal. No congestion.     Mouth/Throat:     Mouth: Mucous membranes are moist.     Pharynx: Oropharynx is clear.  Eyes:     Extraocular Movements: Extraocular movements intact.     Pupils: Pupils are equal, round, and reactive to light.  Cardiovascular:     Rate and Rhythm: Normal rate and regular rhythm.     Heart sounds: No murmur heard. Pulmonary:     Effort: Pulmonary effort is normal.     Breath sounds: Normal breath sounds. No wheezing.  Abdominal:     General: Abdomen is flat. Bowel sounds are normal.     Palpations: Abdomen is soft.  Musculoskeletal:     Comments: TTP to the left groin, pain with ROM of the left hip  Skin:    General: Skin is warm and dry.     Findings: No bruising or lesion.  Neurological:     General: No focal deficit present.     Mental Status: He is alert and oriented to person, place, and time.  Psychiatric:  Mood and Affect: Mood normal.        Behavior: Behavior normal.     Assessment & Plan:   See Encounters Tab for problem based charting.  Patient discussed with Dr. Philipp Ovens

## 2020-07-10 NOTE — Assessment & Plan Note (Signed)
Patient with right AKA secondary to osteomyelitis. Notes socket of prosthesis is too long and is unable to get into the car due to its length. Also note sock length exceed length of his left thigh and interferes with his ability to maneuver with the prosthesis. Also requires new sleeve and current sleeve is having issues with suction and he has trouble keep leg in place. Receives prothesis serves from [employer]  DME orders placed for proethesis

## 2020-07-10 NOTE — Assessment & Plan Note (Signed)
hA1c of 5.5% today at goal of <7. Reports low with more intense workouts. Has been carrying snacks during workouts as needed for this and is trying to eat more prior to working out. He does titrate down novolog dose if sugars are low after work outs, but still has low about twice a week. Advised he tritiate tresiba down to 40 units on days for work outs to help prevent future episodes of hypoglycemia. Has follow up with endocrinology next month.   Plan -Follow up with endocrinology  - Tyler Aas 45 unit daily, 40 units on work out days - Continue with victoza 1.8 mg daily and novolog 20 units at lunch and 25 units with dinner

## 2020-07-12 NOTE — Progress Notes (Signed)
Internal Medicine Clinic Attending  Case discussed with Dr. Christian  At the time of the visit.  We reviewed the resident's history and exam and pertinent patient test results.  I agree with the assessment, diagnosis, and plan of care documented in the resident's note.  

## 2020-07-13 ENCOUNTER — Encounter: Payer: Self-pay | Admitting: *Deleted

## 2020-07-13 ENCOUNTER — Telehealth: Payer: Self-pay

## 2020-07-13 NOTE — Telephone Encounter (Signed)
I have faxed DME order for patient to [employer] for Prosthetic Supplies along with office notes.I have called the patient to let him no that [employer] will be getting in touch with him,patient understood [place], [place] C7/5/20224:39 PM

## 2020-07-15 ENCOUNTER — Other Ambulatory Visit: Payer: Self-pay

## 2020-07-15 ENCOUNTER — Other Ambulatory Visit (INDEPENDENT_AMBULATORY_CARE_PROVIDER_SITE_OTHER): Payer: Medicare Other

## 2020-07-15 DIAGNOSIS — E1165 Type 2 diabetes mellitus with hyperglycemia: Secondary | ICD-10-CM

## 2020-07-15 DIAGNOSIS — Z794 Long term (current) use of insulin: Secondary | ICD-10-CM | POA: Diagnosis not present

## 2020-07-15 LAB — GLUCOSE, RANDOM: Glucose, Bld: 120 mg/dL — ABNORMAL HIGH (ref 70–99)

## 2020-07-16 ENCOUNTER — Other Ambulatory Visit: Payer: Medicare Other

## 2020-07-16 LAB — FRUCTOSAMINE: Fructosamine: 278 umol/L (ref 0–285)

## 2020-07-22 DIAGNOSIS — I129 Hypertensive chronic kidney disease with stage 1 through stage 4 chronic kidney disease, or unspecified chronic kidney disease: Secondary | ICD-10-CM | POA: Diagnosis not present

## 2020-07-22 DIAGNOSIS — N2581 Secondary hyperparathyroidism of renal origin: Secondary | ICD-10-CM | POA: Diagnosis not present

## 2020-07-22 DIAGNOSIS — E876 Hypokalemia: Secondary | ICD-10-CM | POA: Diagnosis not present

## 2020-07-22 DIAGNOSIS — E669 Obesity, unspecified: Secondary | ICD-10-CM | POA: Diagnosis not present

## 2020-07-22 DIAGNOSIS — N184 Chronic kidney disease, stage 4 (severe): Secondary | ICD-10-CM | POA: Diagnosis not present

## 2020-07-23 ENCOUNTER — Ambulatory Visit (INDEPENDENT_AMBULATORY_CARE_PROVIDER_SITE_OTHER): Payer: Medicare Other | Admitting: Endocrinology

## 2020-07-23 ENCOUNTER — Other Ambulatory Visit: Payer: Self-pay

## 2020-07-23 ENCOUNTER — Encounter: Payer: Self-pay | Admitting: Endocrinology

## 2020-07-23 VITALS — BP 158/80 | HR 85

## 2020-07-23 DIAGNOSIS — E785 Hyperlipidemia, unspecified: Secondary | ICD-10-CM | POA: Diagnosis not present

## 2020-07-23 DIAGNOSIS — I10 Essential (primary) hypertension: Secondary | ICD-10-CM

## 2020-07-23 DIAGNOSIS — Z794 Long term (current) use of insulin: Secondary | ICD-10-CM | POA: Diagnosis not present

## 2020-07-23 DIAGNOSIS — E1165 Type 2 diabetes mellitus with hyperglycemia: Secondary | ICD-10-CM

## 2020-07-23 NOTE — Progress Notes (Signed)
Patient ID: [name], male   DOB: 15-Feb-1975, 45 y.o.   MRN: [id number]          Reason for Appointment: Follow-up for Type 2 Diabetes    History of Present Illness:          Date of diagnosis of type 2 diabetes mellitus:  2001      His weight is about 400 pounds at the time of diagnosis He does not know what his initial blood sugars and circumstances were but he apparently was started on insulin initially He was not able to tolerate metformin because of diarrhea Subsequently has been on insulin only, level of control not available but his A1c was significantly high at 12.8 in 2015  Background history:    Recent history:   INSULIN regimen is:   48 units Tresiba once daily   NovoLog 10 breakfast 15-20  units before lunch and dinner   Non-insulin hypoglycemic drugs the patient is taking are:  Victoza: 1.8 mg daily   Current management, blood sugar patterns and problems identified:   His A1c is 5.5, previously 6% This may be affected by his anemia   Has continued to use the freestyle libre version 2 This has been accurate by comparison Interpretation of his blood sugar patterns from his freestyle libre are as follows: Blood sugars showing moderate variability especially overnight and early morning but generally on an average within the target range On an average consistently within the target range HIGHEST blood sugars on an average are from 2-4 PM and lowest 6 8 AM-12 noon Overnight blood sugars are mildly increased overall averaging about 150 but having some variability HYPOGLYCEMIA occurs periodically in the mornings between about 5-10 AM POSTPRANDIAL readings are lower after breakfast, only occasionally high above target after lunch and generally well controlled after dinner HYPERGLYCEMIA occurs only occasionally after lunch and at times late at night after 11 PM  Premeal blood sugars are usually mildly increased at breakfast, near normal at lunch and  dinnertime  Management, insulin regimen and problems:  He tries to avoid taking NovoLog at breakfast time because of his exercise routine except on Sundays However he is usually eating a smaller meal before going to exercise early morning and then another breakfast when he comes back However when he comes back he may only have a small amount of carbohydrate  With more intensive aerobic exercises he still has a tendency to get low and without Premeal insulin and prebreakfast reading may be still slightly high Still not able to lose much weight Taking Victoza and tolerating 1.8 mg  he has been able to reduce his Tresiba further, previously taking as much as 60 units. However even though overall overnight readings are relatively higher from his freestyle libre data he will sometimes have near normal readings overnight and before dinner    Side effects from medications have been: Diarrhea with metformin, vomiting with Ozempic  Glucose monitoring:  Currently with freestyle libre version 2 with data as follows  CGM use % of time 84  2-week average/GV 134  Time in range     81    %  % Time Above 180 13  % Time above 250   % Time Below 70 6     PRE-MEAL Fasting Lunch Dinner Bedtime Overall  Glucose range:       Averages: 146 114 121     POST-MEAL PC Breakfast PC Lunch PC Dinner  Glucose range:     Averages: 114 133  132    Previously  CGM use % of time  94  2-week average/GV  147/39  Time in range     70   %  % Time Above 180  21  % Time above 250 5  % Time Below 70 4     PRE-MEAL Fasting Lunch Dinner Bedtime Overall  Glucose range:       Averages:  126  139  153   147 94   POST-MEAL PC Breakfast PC Lunch PC Dinner  Glucose range:     Averages:  124  165  180     Self-care:    Typical meal intake: Breakfast is egg whites, some oatmeal/grits and banana.  Usually small portions of meat or other protein at lunch and dinner and only a small amount of sweet potato at lunch  or carbohydrate              Dietician visit, most recent: Unknown               Weight history:  Wt Readings from Last 3 Encounters:  07/08/20 (!) 342 lb 3.2 oz (155.2 kg)  05/20/20 (!) 332 lb 11.2 oz (150.9 kg)  04/22/20 (!) 332 lb (150.6 kg)    Glycemic control:   Lab Results  Component Value Date   HGBA1C 5.5 07/08/2020   HGBA1C 6.0 04/15/2020   HGBA1C 8.0 (A) 03/04/2020   Lab Results  Component Value Date   MICROALBUR 160.61 (H) 08/14/2013   LDLCALC 60 01/15/2020   CREATININE 3.33 (H) 06/10/2020   Lab Results  Component Value Date   MICRALBCREAT 1,528.4 (H) 02/15/2017    Lab Results  Component Value Date   FRUCTOSAMINE 278 07/15/2020   FRUCTOSAMINE 315 (H) 04/15/2020   FRUCTOSAMINE 378 (H) 01/15/2020   Lab Results  Component Value Date   HGB 9.5 (L) 07/08/2020      Allergies as of 07/23/2020       Reactions   Reglan [metoclopramide] Other (See Comments)   Dysphoric reaction   Metformin And Related Diarrhea   Ozempic (0.25 Or 0.5 Mg-dose) [semaglutide(0.25 Or 0.21m-dos)] Nausea And Vomiting        Medication List        Accurate as of July 23, 2020 11:16 AM. If you have any questions, ask your nurse or doctor.          Accu-Chek Guide Me w/Device Kit Use to check blood sugar up to 3 times a day What changed:  how much to take how to take this when to take this additional instructions   Accu-Chek Guide test strip Generic drug: glucose blood Check blood sugar up to 3 times a day What changed:  how much to take how to take this when to take this additional instructions   accu-chek softclix lancets Use to check blood sugars 5 times a day. Dx code:E11.9. Insulin dependent. What changed:  how much to take how to take this when to take this   Accu-Chek Softclix Lancets lancets Check blood sugar up to 3 times a day What changed:  how much to take how to take this when to take this additional instructions   acidophilus Caps  capsule Take 1 capsule by mouth daily.   Alpha-Lipoic Acid 600 MG Caps Take 600 mg by mouth daily.   amLODipine 10 MG tablet Commonly known as: NORVASC Take 10 mg by mouth at bedtime.   B-12 500 MCG Subl Place 500 mcg under the tongue daily.  B-D UF III MINI PEN NEEDLES 31G X 5 MM Misc Generic drug: Insulin Pen Needle 1 each by Other route See admin instructions. USE 4-5 TIMES DAILY   Pen Needles 3/16" 31G X 5 MM Misc Use five times daily   carvedilol 25 MG tablet Commonly known as: COREG Take 1 tablet (25 mg total) by mouth 2 (two) times daily with a meal.   Eliquis 5 MG Tabs tablet Generic drug: apixaban Take 1 tablet by mouth twice daily   famotidine 20 MG tablet Commonly known as: PEPCID Take 1 tablet (20 mg total) by mouth 2 (two) times daily. What changed:  when to take this reasons to take this   Fish Oil 1000 MG Caps Take 1,000 mg by mouth at bedtime.   fluticasone 50 MCG/ACT nasal spray Commonly known as: Flonase Place 2 sprays into both nostrils daily.   furosemide 80 MG tablet Commonly known as: LASIX Take 1 tablet by mouth twice daily   gabapentin 100 MG capsule Commonly known as: NEURONTIN TAKE 1 CAPSULE BY MOUTH THREE TIMES DAILY   hydrALAZINE 50 MG tablet Commonly known as: APRESOLINE TAKE 1 TABLET BY MOUTH THREE TIMES DAILY   insulin aspart 100 UNIT/ML FlexPen Commonly known as: NOVOLOG Administer 5 units in the morning with breakfast, 20 units with lunch, and 25 units with dinner daily.  ICD Code:  E11.9   lovastatin 20 MG tablet Commonly known as: MEVACOR Take 1 tablet by mouth once daily   ondansetron 4 MG tablet Commonly known as: ZOFRAN Take 1 tablet (4 mg total) by mouth every 6 (six) hours as needed for nausea.   oxybutynin 5 MG tablet Commonly known as: DITROPAN Take 5 mg by mouth 3 (three) times daily.   polyethylene glycol 17 g packet Commonly known as: MIRALAX / GLYCOLAX Take 17 g by mouth 2 (two) times daily. What  changed:  when to take this reasons to take this   potassium chloride SA 20 MEQ tablet Commonly known as: KLOR-CON Take 20 mEq by mouth 2 (two) times daily.   senna-docusate 8.6-50 MG tablet Commonly known as: Senokot-S Take 1 tablet by mouth daily. What changed:  when to take this reasons to take this   sildenafil 50 MG tablet Commonly known as: VIAGRA Take 50 mg by mouth as needed for erectile dysfunction.   tamsulosin 0.4 MG Caps capsule Commonly known as: FLOMAX Take 0.4 mg by mouth at bedtime.   Tyler Aas FlexTouch 200 UNIT/ML FlexTouch Pen Generic drug: insulin degludec INJECT 45 UNITS SUBCUTANEOUSLY ONCE DAILY   Victoza 18 MG/3ML Sopn Generic drug: liraglutide INJECT 1.2 MG INTO THE SKIN ONCE DAILY AT THE SAME TIME.        Allergies:  Allergies  Allergen Reactions   Reglan [Metoclopramide] Other (See Comments)    Dysphoric reaction   Metformin And Related Diarrhea   Ozempic (0.25 Or 0.5 Mg-Dose) [Semaglutide(0.25 Or 0.24m-Dos)] Nausea And Vomiting    Past Medical History:  Diagnosis Date   Acute venous embolism and thrombosis of deep vessels of proximal lower extremity (HCC) 07/19/2011   Anemia    Anginal pain (HCC)    pt denies   Anxiety    Chest pain, neg MI, normal coronaries by cath 02/18/2013   pt denies   CHF (congestive heart failure) (HCC)    CKD (chronic kidney disease) stage 3, GFR 30-59 ml/min (HCC) 02/19/2013   Colon polyps    Depression    [place]  DVT (deep venous thrombosis) ([place]) 09/2002   patient reports additional DVTs in '06 & '11 (unconfirmed)   ED (erectile dysfunction)    GERD (gastroesophageal reflux disease)    History of blood transfusion    "related to OR" (10/31/2016)   Hyperlipidemia 02/19/2013   Hypertension    Nausea & vomiting    "constant for the last couple weeks" (10/31/2016)   Nephrotic syndrome    Obesity    BMI 44, weight 346 pounds 01/30/14   OSA (obstructive sleep apnea)  02/28/2018   Mild obstructive sleep apnea with an AHI of 9.8/h but severe during REM sleep with an AHI of 33.8/h.  Oxygen saturations dropped to 86% and there was moderate snoring   Peripheral edema    Pneumonia    Prosthesis adjustment 08/17/2016   Pulmonary embolism ([place]) 09/2002   treated with 6 months of warfarin   Pyelonephritis 02/02/2018   Type I diabetes mellitus ([place]) dx'd 2001   Urinary retention     Past Surgical History:  Procedure Laterality Date   AMPUTATION Right 12/22/2013   Procedure: AMPUTATION BELOW KNEE;  Surgeon: [name], MD;  Location: [facility];  Service: Orthopedics;  Laterality: Right;   AMPUTATION Right 02/19/2014   Procedure: RIGHT ABOVE KNEE AMPUTATION ;  Surgeon: [name], MD;  Location: [facility];  Service: Orthopedics;  Laterality: Right;   blood clot removal  2016   CARDIAC CATHETERIZATION  02/18/2013   normal coronaries   ESOPHAGOGASTRODUODENOSCOPY N/A 11/02/2016   Procedure: ESOPHAGOGASTRODUODENOSCOPY (EGD);  Surgeon: [name], MD;  Location[facility] ENDOSCOPY;  Service: Endoscopy;  Laterality: N/A;   I & D EXTREMITY Right 12/14/2013   Procedure: IRRIGATION AND DEBRIDEMENT RIGHT FOOT;  Surgeon: [name], MD;  Location: [facility];  Service: Orthopedics;  Laterality: Right;   INCISION AND DRAINAGE ABSCESS  2007; 2015   "back"   INCISION AND DRAINAGE OF WOUND Right 12/22/2013   Procedure: I&D RIGHT BUTTOCK;  Surgeon: [name], MD;  Location: [facility];  Service: Orthopedics;  Laterality: Right;   LEFT HEART CATHETERIZATION WITH CORONARY ANGIOGRAM N/A 02/18/2013   Procedure: LEFT HEART CATHETERIZATION WITH CORONARY ANGIOGRAM;  Surgeon: [name], MD;  Location: [facility] CATH LAB;  Service: Cardiovascular;  Laterality: N/A;    Family History  Problem Relation Age of Onset   Heart disease Mother    Diabetes Mother    Diabetes Maternal Uncle    Diabetes Cousin     Social History:  reports that he has never smoked. He has never used smokeless tobacco. He  reports current alcohol use. He reports that he does not use drugs.   Review of Systems    Lipid history: Has been on lovastatin 20 mg with labs as follows: Prescribed by PCP   Lab Results  Component Value Date   CHOL 118 01/15/2020   HDL 30.60 (L) 01/15/2020   LDLCALC 60 01/15/2020   TRIG 140.0 01/15/2020   CHOLHDL 4 01/15/2020           Hypertension: He is taking clonidine, Norvasc, hydralazine and Coreg followed by nephrologist  BP Readings from Last 3 Encounters:  07/23/20 (!) 158/80  07/08/20 (!) 150/78  07/08/20 127/69     Most recent eye exam was in 17/9150   Complications of diabetes: Nephropathy with renal insufficiency and proteinuria, unknown status of retinopathy, erectile dysfunction  Followed by nephrologist for CKD He is on ESA as well as iron infusions for anemia  Lab Results  Component Value Date  CREATININE 3.33 (H) 06/10/2020   CREATININE 3.18 (H) 04/22/2020   CREATININE 2.66 (H) 04/15/2020    Lab Results  Component Value Date   HGB 9.5 (L) 07/08/2020      LABS:  No visits with results within 1 Week(s) from this visit.  Latest known visit with results is:  Lab on 07/15/2020  Component Date Value Ref Range Status   Fructosamine 07/15/2020 278  0 - 285 umol/L Final   Comment: Published reference interval for apparently healthy subjects between age 8 and 60 is 37 - 285 umol/L and in a poorly controlled diabetic population is 228 - 563 umol/L with a mean of 396 umol/L.    Glucose, Bld 07/15/2020 120 (A) 70 - 99 mg/dL Final    Physical Examination:  BP (!) 158/80   Pulse 85   SpO2 98%       ASSESSMENT:  Diabetes type 2, with morbid obesity  See history of present illness for detailed discussion of current diabetes management, blood sugar patterns and problems identified  His last A1c is 5.5% and improved, this is likely falsely low from his anemia  Is on basal bolus insulin and Victoza 1.8 mg daily  Blood sugar  patterns were evaluated for the last 2 weeks and see above discussion He is still requiring relatively lower amounts of basal insulin but does have some variability in blood sugars especially overnight Discussed causes of high and low blood sugars Tolerating Victoza  Hypertension: Followed by nephrology and has been seen fairly regularly  Morbid obesity: He is not wanting to consider bariatric surgery  PLAN:   He will stay on 1.8 mg Victoza and new prescription will be given for increased dosage No change in [place] dose but if he is waking up with relatively high readings consistently may need to go back up Will need to adjust mealtime dose based on his carbohydrate intake and likely needs a little less insulin at lunch and dinner sometimes Also may need to small NovoLog coverage for bedtime snack if having significant carbohydrate When he is exercising more vigorously will need to take an additional carbohydrate snack such as half a bagel Continue freestyle libre Periodically check home fingerstick readings to compare with libre  Follow-up in 12 weeks   There are no Patient Instructions on file for this visit.  Total visit time including counseling = 30 minutes    [name] 07/23/2020, 11:16 AM   Note: This office note was prepared with Dragon voice recognition system technology. Any transcriptional errors that result from this process are unintentional.

## 2020-07-27 ENCOUNTER — Telehealth: Payer: Medicare Other

## 2020-07-28 ENCOUNTER — Ambulatory Visit: Payer: Medicare Other

## 2020-07-28 NOTE — Patient Instructions (Signed)
Visit Information   Goals Addressed             This Visit's Progress    Blood Pressure < 140/90       BP Readings from Last 3 Encounters:  07/23/20 (!) 158/80  07/08/20 (!) 150/78  07/08/20 127/69    Not meeting blood pressure targets     HEMOGLOBIN A1C < 7        Lab Results  Component Value Date   HGBA1C 5.5 07/08/2020   HGBA1C 6.0 04/15/2020   HGBA1C 8.0 (A) 03/04/2020    Meeting Hgb A1C target      LDL CALC < 100        Lab Results  Component Value Date   CHOL 118 01/15/2020   HDL 30.60 (L) 01/15/2020   LDLCALC 60 01/15/2020   TRIG 140.0 01/15/2020   CHOLHDL 4 01/15/2020     Meeting lipid targets     Monitor and Manage My Blood Sugar-Diabetes Type 2       Timeframe:  Long-Range Goal Priority:  High Start Date:       12/15/19                      Expected End Date:   ongoing            - check blood sugar at prescribed times - check blood sugar before and after exercise - check blood sugar if I feel it is too high or too low - enter blood sugar readings and medication or insulin into daily log - take the blood sugar meter to all doctor visits            The patient verbalized understanding of instructions, educational materials, and care plan provided today and declined offer to receive copy of patient instructions, educational materials, and care plan.   Telephone follow up appointment with care management team member scheduled for: 07/29/20@245   [name], RN, BSN, CCM Care Management Coordinator [facility] Internal Medicine Phone: [phone] / Fax: [phone]

## 2020-07-28 NOTE — Chronic Care Management (AMB) (Signed)
Care Management    RN Visit Note  07/28/2020 Name: [name] MRN: [id number] DOB: March 24, 1975  Subjective: [name] is a 45 y.o. year old male who is a primary care patient of Victor Mercury, MD. The care management team was consulted for assistance with disease management and care coordination needs.    Engaged with patient by telephone for follow up visit in response to provider referral for case management and/or care coordination services.   Consent to Services:   [name] was given information about Care Management services today including:  Care Management services includes personalized support from designated clinical staff supervised by his physician, including individualized plan of care and coordination with other care providers 24/7 contact phone numbers for assistance for urgent and routine care needs. The patient may stop case management services at any time by phone call to the office staff.  Patient agreed to services and consent obtained.   Assessment: Review of patient past medical history, allergies, medications, health status, including review of consultants reports, laboratory and other test data, was performed as part of comprehensive evaluation and provision of chronic care management services.   SDOH (Social Determinants of Health) assessments and interventions performed:    Care Plan  Allergies  Allergen Reactions   Reglan [Metoclopramide] Other (See Comments)    Dysphoric reaction   Metformin And Related Diarrhea   Ozempic (0.25 Or 0.5 Mg-Dose) [Semaglutide(0.25 Or 0.51m-Dos)] Nausea And Vomiting    Outpatient Encounter Medications as of 07/28/2020  Medication Sig Note   Accu-Chek Softclix Lancets lancets Check blood sugar up to 3 times a day (Patient taking differently: 1 each by Other route See admin instructions. Check blood sugar up to 4-5 times a day)    acidophilus (RISAQUAD) CAPS capsule Take 1 capsule by mouth daily.    Alpha-Lipoic  Acid 600 MG CAPS Take 600 mg by mouth daily.    amLODipine (NORVASC) 10 MG tablet Take 10 mg by mouth at bedtime.    Blood Glucose Monitoring Suppl (ACCU-CHEK GUIDE ME) w/Device KIT Use to check blood sugar up to 3 times a day (Patient taking differently: 1 each by Other route See admin instructions. Use to check blood sugar up to 4-5 times a day)    carvedilol (COREG) 25 MG tablet Take 1 tablet (25 mg total) by mouth 2 (two) times daily with a meal.    Cyanocobalamin (B-12) 500 MCG SUBL Place 500 mcg under the tongue daily.     ELIQUIS 5 MG TABS tablet Take 1 tablet by mouth twice daily    famotidine (PEPCID) 20 MG tablet Take 1 tablet (20 mg total) by mouth 2 (two) times daily. (Patient taking differently: Take 20 mg by mouth as needed for heartburn or indigestion.) 11/28/2019: .   fluticasone (FLONASE) 50 MCG/ACT nasal spray Place 2 sprays into both nostrils daily.    furosemide (LASIX) 80 MG tablet Take 1 tablet by mouth twice daily 02/17/2020: Pt states he needs refills   gabapentin (NEURONTIN) 100 MG capsule TAKE 1 CAPSULE BY MOUTH THREE TIMES DAILY    glucose blood (ACCU-CHEK GUIDE) test strip Check blood sugar up to 3 times a day (Patient taking differently: 1 each by Other route See admin instructions. Check blood sugar up to 4-5 times a day)    hydrALAZINE (APRESOLINE) 50 MG tablet TAKE 1 TABLET BY MOUTH THREE TIMES DAILY    insulin aspart (NOVOLOG) 100 UNIT/ML FlexPen Administer 5 units in the morning with breakfast, 20 units with lunch,  and 25 units with dinner daily.  ICD Code:  E11.9    insulin degludec (TRESIBA FLEXTOUCH) 200 UNIT/ML FlexTouch Pen INJECT 45 UNITS SUBCUTANEOUSLY ONCE DAILY    Insulin Pen Needle (B-D UF III MINI PEN NEEDLES) 31G X 5 MM MISC 1 each by Other route See admin instructions. USE 4-5 TIMES DAILY    Insulin Pen Needle (PEN NEEDLES 3/16") 31G X 5 MM MISC Use five times daily    Lancet Devices (ACCU-CHEK SOFTCLIX) lancets Use to check blood sugars 5 times a day. Dx  code:E11.9. Insulin dependent. (Patient taking differently: 1 each by Other route See admin instructions. Use to check blood sugars 5 times a day. Dx code:E11.9. Insulin dependent.)    lovastatin (MEVACOR) 20 MG tablet Take 1 tablet by mouth once daily    Omega-3 Fatty Acids (FISH OIL) 1000 MG CAPS Take 1,000 mg by mouth at bedtime.     ondansetron (ZOFRAN) 4 MG tablet Take 1 tablet (4 mg total) by mouth every 6 (six) hours as needed for nausea.    oxybutynin (DITROPAN) 5 MG tablet Take 5 mg by mouth 3 (three) times daily.     polyethylene glycol (MIRALAX / GLYCOLAX) 17 g packet Take 17 g by mouth 2 (two) times daily. (Patient taking differently: Take 17 g by mouth as needed for mild constipation.)    potassium chloride SA (KLOR-CON) 20 MEQ tablet Take 20 mEq by mouth 2 (two) times daily.    senna-docusate (SENOKOT-S) 8.6-50 MG tablet Take 1 tablet by mouth daily. (Patient taking differently: Take 1 tablet by mouth as needed for mild constipation.)    sildenafil (VIAGRA) 50 MG tablet Take 50 mg by mouth as needed for erectile dysfunction.    tamsulosin (FLOMAX) 0.4 MG CAPS capsule Take 0.4 mg by mouth at bedtime.     VICTOZA 18 MG/3ML SOPN INJECT 1.2 MG INTO THE SKIN ONCE DAILY AT THE SAME TIME.    No facility-administered encounter medications on file as of 07/28/2020.    Patient Active Problem List   Diagnosis Date Noted   Left hip pain 07/10/2020   Trapezius strain, right, initial encounter 05/20/2020   Anemia associated with chronic renal failure    CKD (chronic kidney disease)    Hemoglobinuria 12/12/2018   OSA (obstructive sleep apnea) 02/28/2018   Chronic diastolic heart failure ([place]) 02/02/2018   Healthcare maintenance 08/15/2017   Erectile dysfunction 04/22/2017   History of pulmonary embolus (PE) 06/09/2016   Depression 05/04/2016   Hypogonadism in male 05/04/2016   Morbid obesity with BMI of 40.0-44.9, adult ([place]) 10/14/2015   Dyslipidemia 08/12/2014   Uncontrolled type 2  diabetes mellitus with hyperglycemia, with long-term current use of insulin ([place]) 08/12/2014   Unilateral AKA, right ([place]) 12/26/2013   Hyperlipidemia 02/19/2013   S/P cardiac cath, 02/18/13, normal coronaries 02/19/2013   Chronic renal insufficiency, stage 3 (moderate) ([place]) 02/19/2013   Hypertension 07/24/2011   Long term current use of anticoagulant therapy 07/19/2011   History of DVT (deep vein thrombosis) 07/28/2009   Morbid obesity ([place]) 10/26/2005   Insulin dependent type 2 diabetes mellitus, controlled ([place]) 01/09/2002    Conditions to be addressed/monitored: HTN and DMII  Care Plan : Diabetes Type 2 (Adult)  Updates made by [name], RN since 07/28/2020 12:00 AM     Problem: Currently meeting A1C target but having large fluctuations in blood sugars   Priority: High  Onset Date: 12/15/2019     Goal: Glycemic Management Optimized without large fluctuations in blood  sugars   Start Date: 12/15/2019  Expected End Date: 10/08/2020  Recent Progress: On track  Priority: High  Note:   Objective:  Lab Results  Component Value Date   HGBA1C 5.5 07/08/2020   HGBA1C 6.0 04/15/2020   HGBA1C 8.0 (A) 03/04/2020   Lab Results  Component Value Date   MICROALBUR 160.61 (H) 08/14/2013   LDLCALC 60 01/15/2020   CREATININE 3.33 (H) 06/10/2020    Current Barriers:  Knowledge Deficits related to basic Diabetes pathophysiology and self care/management Unable to independently meet pre and post meal CBG targets - successful telephone outreach to patient to complete follow up assessment. Verified patient with two identifier and patient told this RNCM it was not a good time to talk, plan to call tomorrow 07/29/20 at 2:45.  Case Manager Clinical Goal(s):  Over the next 30-60 days, patient will demonstrate improved adherence to prescribed treatment plan for diabetes self care/management as evidenced by:  daily monitoring and recording of CBG  adherence to ADA/ carb modified  diet exercise 5-7 days/week- adherence to prescribed medication regimen Interventions:  Review of patient status, including review of consultants reports, relevant laboratory and other test results, and medications completed. Assessed satisfaction with [employer] Flash Glucose monitor Assessed frequency of hypoglycemia and ability to self treat Assessed medication taking behavior 06/04/20- Contact information for [place] mailed to patient's home address with patient letter encouraging him to contact the foundation Patient encouraged to continue his exercise routine with his personal trainer Positive  reinforcement given for his successful weight loss and consistency with personal training session - A1C testing facilitated - barriers to adherence to treatment plan identified - dental care encouraged - mutual A1C goal set or reviewed - devices to improve adherence to care identified - self-awareness of signs/symptoms of hypo or hyperglycemia encouraged Discussed plans with patient for ongoing care management follow up and provided patient with direct contact information for care management team Reviewed scheduled/upcoming provider appointments including: Aranesp injection on 6/30, for labs at [facility] on 7/8 and with Dr. Dwyane Dee on 7/15 - A1C testing facilitated - barriers to adherence to treatment plan identified - dental care encouraged - mutual A1C goal set or reviewed - devices to improve adherence to care identified - self-awareness of signs/symptoms of hypo or hyperglycemia encouraged Discussed plans with patient for ongoing care management follow up and provided patient with direct contact information for care management team In the next 30-60 days, patient will:  - Self administers oral medications as prescribed Self administers insulin as prescribed Self administers injectable DM medication Victoza and Tyler Aas as prescribed Attends all  scheduled provider appointments Checks blood sugars as prescribed and utilize hyper and hypoglycemia protocol as needed Adheres to prescribed ADA/carb modified Follow Up Plan: The care management team will reach out to the patient again over the next 1 day.      Care Plan : General Plan of Care (Adult)- DME needs of new prostheiss and quad cane and w/c that will enablepatient ot play w/c basketball  Updates made by [name], RN since 07/28/2020 12:00 AM     Problem: DME needs   Priority: High  Onset Date: 06/29/2020     Goal: Patient will have DME needs met   Start Date: 06/29/2020  Expected End Date: 10/08/2020  Recent Progress: On track  Priority: High  Note:   Current Barriers: Plan to call patient on 07/29/20. Care Coordination needs related to needed DME/Supplies in a patient with IDDM, HF, HLD,  HTN, CKD, s/p R AKA 02/20/14 Care Coordination needs related to DME needs in a patient with IDDM, HF, HLD, HTN, CKD, s/p R AKA 02/20/14  Nurse Case Manager Clinical Goal(s):  Over the next 30-90 days, patient will verbalize understanding of plan to obtain needed DME.  Over the next 30-90 days, patient will work with care guide and/or [employer]  and the [employer] to address needs related to DME needs.  Interventions:  Inter-disciplinary care team collaboration (see longitudinal plan of care) Collaboration with/confirmation of receipt of DME orders at [employer]  and the [place] patient to call the [employer] again to request grant information for basketball w/c Collaborated with [employer] representatives  regarding securing quad cane for patient Spoke with [employer] to determine requirements to secure new right AKA prosthesis  Notified provider of need for face to face assessment in clinic to document need for new prosthesis collaboration with PCP regarding development and update of comprehensive plan of care as evidenced by  provider attestation and co-signature Inter-disciplinary care team collaboration (see longitudinal plan of care) Patient Self Care Activities:  Patient will self administer medications as prescribed Patient will attend all scheduled provider appointments Patient will call pharmacy for medication refills Patient will call provider office for new concerns or questions Patient will follow up on referrals provided to secure specialized w/c  Unable to independently secure DME  Initial goal documentation      Plan: Telephone follow up appointment with care management team member scheduled for:  07/29/20  [name], RN, BSN, CCM Care Management Coordinator [facility] Phone: [phone] / Fax: [phone]

## 2020-07-29 ENCOUNTER — Ambulatory Visit: Payer: Medicare Other

## 2020-07-29 NOTE — Chronic Care Management (AMB) (Signed)
Care Management    RN Visit Note  07/29/2020 Name: [name] MRN: [id number] DOB: 1975/05/31  Subjective: [name] is a 45 y.o. year old male who is a primary care patient of Victor Mercury, MD. The care management team was consulted for assistance with disease management and care coordination needs.    Engaged with patient by telephone for follow up visit in response to provider referral for case management and/or care coordination services.   Consent to Services:   [name] was given information about Care Management services today including:  Care Management services includes personalized support from designated clinical staff supervised by his physician, including individualized plan of care and coordination with other care providers 24/7 contact phone numbers for assistance for urgent and routine care needs. The patient may stop case management services at any time by phone call to the office staff.  Patient agreed to services and consent obtained.    Assessment: Patient is making progress with exercising and managing Diabetes  . See Care Plan below for interventions and patient self-care actives. Follow up Plan: Patient would like continued follow-up.  CCM RNCM will outreach the patient within the next 30 -45 days.  Patient will call office if needed prior to next encounter : Review of patient past medical history, allergies, medications, health status, including review of consultants reports, laboratory and other test data, was performed as part of comprehensive evaluation and provision of chronic care management services.   SDOH (Social Determinants of Health) assessments and interventions performed:    Care Plan  Allergies  Allergen Reactions   Reglan [Metoclopramide] Other (See Comments)    Dysphoric reaction   Metformin And Related Diarrhea   Ozempic (0.25 Or 0.5 Mg-Dose) [Semaglutide(0.25 Or 0.27m-Dos)] Nausea And Vomiting    Outpatient Encounter  Medications as of 07/29/2020  Medication Sig Note   Accu-Chek Softclix Lancets lancets Check blood sugar up to 3 times a day (Patient taking differently: 1 each by Other route See admin instructions. Check blood sugar up to 4-5 times a day)    acidophilus (RISAQUAD) CAPS capsule Take 1 capsule by mouth daily.    Alpha-Lipoic Acid 600 MG CAPS Take 600 mg by mouth daily.    amLODipine (NORVASC) 10 MG tablet Take 10 mg by mouth at bedtime.    Blood Glucose Monitoring Suppl (ACCU-CHEK GUIDE ME) w/Device KIT Use to check blood sugar up to 3 times a day (Patient taking differently: 1 each by Other route See admin instructions. Use to check blood sugar up to 4-5 times a day)    carvedilol (COREG) 25 MG tablet Take 1 tablet (25 mg total) by mouth 2 (two) times daily with a meal.    Cyanocobalamin (B-12) 500 MCG SUBL Place 500 mcg under the tongue daily.     ELIQUIS 5 MG TABS tablet Take 1 tablet by mouth twice daily    famotidine (PEPCID) 20 MG tablet Take 1 tablet (20 mg total) by mouth 2 (two) times daily. (Patient taking differently: Take 20 mg by mouth as needed for heartburn or indigestion.) 11/28/2019: .   fluticasone (FLONASE) 50 MCG/ACT nasal spray Place 2 sprays into both nostrils daily.    furosemide (LASIX) 80 MG tablet Take 1 tablet by mouth twice daily 02/17/2020: Pt states he needs refills   gabapentin (NEURONTIN) 100 MG capsule TAKE 1 CAPSULE BY MOUTH THREE TIMES DAILY    glucose blood (ACCU-CHEK GUIDE) test strip Check blood sugar up to 3 times a day (Patient taking  differently: 1 each by Other route See admin instructions. Check blood sugar up to 4-5 times a day)    hydrALAZINE (APRESOLINE) 50 MG tablet TAKE 1 TABLET BY MOUTH THREE TIMES DAILY    insulin aspart (NOVOLOG) 100 UNIT/ML FlexPen Administer 5 units in the morning with breakfast, 20 units with lunch, and 25 units with dinner daily.  ICD Code:  E11.9    insulin degludec (TRESIBA FLEXTOUCH) 200 UNIT/ML FlexTouch Pen INJECT 45 UNITS  SUBCUTANEOUSLY ONCE DAILY    Insulin Pen Needle (B-D UF III MINI PEN NEEDLES) 31G X 5 MM MISC 1 each by Other route See admin instructions. USE 4-5 TIMES DAILY    Insulin Pen Needle (PEN NEEDLES 3/16") 31G X 5 MM MISC Use five times daily    Lancet Devices (ACCU-CHEK SOFTCLIX) lancets Use to check blood sugars 5 times a day. Dx code:E11.9. Insulin dependent. (Patient taking differently: 1 each by Other route See admin instructions. Use to check blood sugars 5 times a day. Dx code:E11.9. Insulin dependent.)    lovastatin (MEVACOR) 20 MG tablet Take 1 tablet by mouth once daily    Omega-3 Fatty Acids (FISH OIL) 1000 MG CAPS Take 1,000 mg by mouth at bedtime.     ondansetron (ZOFRAN) 4 MG tablet Take 1 tablet (4 mg total) by mouth every 6 (six) hours as needed for nausea.    oxybutynin (DITROPAN) 5 MG tablet Take 5 mg by mouth 3 (three) times daily.     polyethylene glycol (MIRALAX / GLYCOLAX) 17 g packet Take 17 g by mouth 2 (two) times daily. (Patient taking differently: Take 17 g by mouth as needed for mild constipation.)    potassium chloride SA (KLOR-CON) 20 MEQ tablet Take 20 mEq by mouth 2 (two) times daily.    senna-docusate (SENOKOT-S) 8.6-50 MG tablet Take 1 tablet by mouth daily. (Patient taking differently: Take 1 tablet by mouth as needed for mild constipation.)    sildenafil (VIAGRA) 50 MG tablet Take 50 mg by mouth as needed for erectile dysfunction.    tamsulosin (FLOMAX) 0.4 MG CAPS capsule Take 0.4 mg by mouth at bedtime.     VICTOZA 18 MG/3ML SOPN INJECT 1.2 MG INTO THE SKIN ONCE DAILY AT THE SAME TIME.    No facility-administered encounter medications on file as of 07/29/2020.    Patient Active Problem List   Diagnosis Date Noted   Left hip pain 07/10/2020   Trapezius strain, right, initial encounter 05/20/2020   Anemia associated with chronic renal failure    CKD (chronic kidney disease)    Hemoglobinuria 12/12/2018   OSA (obstructive sleep apnea) 02/28/2018   Chronic  diastolic heart failure ([place]) 02/02/2018   Healthcare maintenance 08/15/2017   Erectile dysfunction 04/22/2017   History of pulmonary embolus (PE) 06/09/2016   Depression 05/04/2016   Hypogonadism in male 05/04/2016   Morbid obesity with BMI of 40.0-44.9, adult ([place]) 10/14/2015   Dyslipidemia 08/12/2014   Uncontrolled type 2 diabetes mellitus with hyperglycemia, with long-term current use of insulin ([place]) 08/12/2014   Unilateral AKA, right ([place]) 12/26/2013   Hyperlipidemia 02/19/2013   S/P cardiac cath, 02/18/13, normal coronaries 02/19/2013   Chronic renal insufficiency, stage 3 (moderate) ([place]) 02/19/2013   Hypertension 07/24/2011   Long term current use of anticoagulant therapy 07/19/2011   History of DVT (deep vein thrombosis) 07/28/2009   Morbid obesity ([place]) 10/26/2005   Insulin dependent type 2 diabetes mellitus, controlled ([place]) 01/09/2002    Conditions to be addressed/monitored: HTN and DMII  Care Plan : Diabetes Type 2 (Adult)  Updates made by [name], RN since 07/29/2020 12:00 AM     Problem: Currently meeting A1C target but having large fluctuations in blood sugars   Priority: High  Onset Date: 12/15/2019     Goal: Glycemic Management Optimized without large fluctuations in blood sugars   Start Date: 12/15/2019  Expected End Date: 10/08/2020  Recent Progress: On track  Priority: High  Note:   Objective:  Lab Results  Component Value Date   HGBA1C 5.5 07/08/2020   HGBA1C 6.0 04/15/2020   HGBA1C 8.0 (A) 03/04/2020   Lab Results  Component Value Date   MICROALBUR 160.61 (H) 08/14/2013   LDLCALC 60 01/15/2020   CREATININE 3.33 (H) 06/10/2020    Current Barriers:  Knowledge Deficits related to basic Diabetes pathophysiology and self care/management Unable to independently meet pre and post meal CBG targets - successful telephone outreach to patient to complete follow up assessment.  Discussed patients day and his CBG readings.  Patient attends 2 exercise  classes on Tues- Thurs- Saturday.  Readings today were: first thing in the morning 158, after breakfast 110, after his first class 95, 2nd class 68 and he started feeling symptoms so he ate lunch.  Current reading was 122.    Case Manager Clinical Goal(s):  Over the next 30-60 days, patient will demonstrate improved adherence to prescribed treatment plan for diabetes self care/management as evidenced by:  daily monitoring and recording of CBG  adherence to ADA/ carb modified diet exercise 5-7 days/week- adherence to prescribed medication regimen Interventions:  Review of patient status, including review of consultants reports, relevant laboratory and other test results, and medications completed. Assessed satisfaction with [employer] Flash Glucose monitor Assessed frequency of hypoglycemia and ability to self treat Assessed medication taking behavior 06/04/20- Contact information for [employer] mailed to patient's home address with patient letter encouraging him to contact the foundation Patient encouraged to continue his exercise routine with his personal trainer Positive  reinforcement given for his successful weight loss and consistency with personal training session- Patient attending 6 exercise classes per week, has not weighed himself lately - barriers to adherence to treatment plan identified - mutual A1C goal set or reviewed - devices to improve adherence to care identified - self-awareness of signs/symptoms of hypo or hyperglycemia encouraged Discussed plans with patient for ongoing care management follow up and provided patient with direct contact information for care management team Reviewed scheduled/upcoming provider appointments including:  - A1C testing facilitated - barriers to adherence to treatment plan identified - dental care encouraged - mutual A1C goal set or reviewed - devices to improve adherence to care identified - self-awareness of  signs/symptoms of hypo or hyperglycemia encouraged Discussed plans with patient for ongoing care management follow up and provided patient with direct contact information for care management team In the next 30-60 days, patient will:  - Self administers oral medications as prescribed Self administers insulin as prescribed Self administers injectable DM medication Victoza and Tyler Aas as prescribed Attends all scheduled provider appointments Checks blood sugars as prescribed and utilize hyper and hypoglycemia protocol as needed Adheres to prescribed ADA/carb modified Follow Up Plan: The care management team will reach out to the patient again over the next 30 days.      Care Plan : General Plan of Care (Adult)- DME needs of new prostheiss and quad cane and w/c that will enablepatient ot play w/c basketball  Updates made by [name], RN since  07/29/2020 12:00 AM     Problem: DME needs   Priority: High  Onset Date: 06/29/2020     Goal: Patient will have DME needs met   Start Date: 06/29/2020  Expected End Date: 10/08/2020  Recent Progress: On track  Priority: High  Note:   Current Barriers:  Care Coordination needs related to needed DME/Supplies in a patient with IDDM, HF, HLD, HTN, CKD, s/p R AKA 02/20/14 Care Coordination needs related to DME needs in a patient with IDDM, HF, HLD, HTN, CKD, s/p R AKA 02/20/14  Nurse Case Manager Clinical Goal(s):  Over the next 30-90 days, patient will verbalize understanding of plan to obtain needed DME.  Over the next 30-90 days, patient will work with care guide and/or [employer]  and the [employer] to address needs related to DME needs.  Interventions:  Inter-disciplinary care team collaboration (see longitudinal plan of care) Collaboration with/confirmation of receipt of DME orders at [employer]  and the [name] patient to call the [employer] again to request grant information for  basketball w/c- Patient notes that he has called several times to the [employer] and he has not heard back from them. Collaborated with [employer] representatives  regarding securing quad cane for patient- Patient received quad cane from [place] with [employer] to determine requirements to secure new right AKA prosthesis -Patient went to [place] on 07/27/20 and was fitted for a new prosthesis.  They will be in touch with him when he needs to come and pick up/fitting. collaboration with PCP regarding development and update of comprehensive plan of care as evidenced by provider attestation and co-signature Inter-disciplinary care team collaboration (see longitudinal plan of care) Patient Self Care Activities:  Patient will self administer medications as prescribed Patient will attend all scheduled provider appointments Patient will call pharmacy for medication refills Patient will call provider office for new concerns or questions Patient will follow up on referrals provided to secure specialized w/c  Unable to independently secure DME  Initial goal documentation      Plan: Telephone follow up appointment with care management team member scheduled for:  30-45 days.  [name], RN, BSN, CCM Care Management Coordinator [facility] Phone: [phone] / Fax: [phone]

## 2020-07-29 NOTE — Patient Instructions (Signed)
Visit Information   Goals Addressed               This Visit's Progress     " I want to get my BMI under 35 so I am eligible for renal transplant should I need it." (pt-stated)        CARE PLAN ENTRY (see longitudinal plan of care for additional care plan information)  Current Barriers:  Knowledge Deficits related to weight loss strategies as evidenced by weight gain reported/observed and/or altered eating habits Chronic Disease Management support and education needs related to weight management in patient with CHF, HTN, HLD, DMII, and CKD Stage 3, s/p right AKA Care Coordination needs related to assisting patient  with weight loss in a patient with IDDM, HF, HLD, HTN, CKD, s/p R AKA  Nurse Case Manager Clinical Goal(s):  Over the next 30-60 days, patient will verbalize basic understanding of plan for initiation of weight loss strategies Over the next 30-60  days, patient will demonstrate improved health management independence as evidenced by exercising as advised by provider (describe) 3-6 times weekly and following heart healthy diet per his clinic dietician Over the next 30-60 days, patient will work with health care team and fitness team to address needs related to ongoing weight loss  Interventions:  Evaluation of current treatment plan related to weight loss and patient's adherence to plan as established by provider Discussed plans with patient for ongoing care management follow up and provided patient with direct contact information for care management team Reviewed scheduled/upcoming provider appointments   Patient Self Care Activities:  Patient verbalizes understanding of plan to work with health care team and fitness team to achieve desired weight loss to meet goal of BMI<35-Patient noted he is "obsessed with weighing himself" so he stopped.  He shared he will weight himself and then go back and do it again several times the same day.  Educated that he should weight himself  the same time, same day, once per week should be sufficient.   Self administers medications as prescribed Attends all scheduled provider appointments Calls pharmacy for medication refills Calls provider office for new concerns or questions Unable to independently meet weight loss goal of BMI <35          The patient verbalized understanding of instructions, educational materials, and care plan provided today and declined offer to receive copy of patient instructions, educational materials, and care plan.   Telephone follow up appointment with care management team member scheduled for: 08/31/20@3PM   [name], RN, BSN, CCM Care Management Coordinator [facility] Phone: [phone] / Fax: [phone]

## 2020-08-05 ENCOUNTER — Encounter (HOSPITAL_COMMUNITY): Payer: Medicare Other

## 2020-08-10 ENCOUNTER — Ambulatory Visit (HOSPITAL_COMMUNITY)
Admission: RE | Admit: 2020-08-10 | Discharge: 2020-08-10 | Disposition: A | Discharge status: Home / Self Care | Payer: Medicare Other | Source: Ambulatory Visit | Attending: Nephrology | Admitting: Nephrology

## 2020-08-10 VITALS — BP 156/70 | HR 90 | Temp 98.2°F | Resp 18

## 2020-08-10 DIAGNOSIS — D631 Anemia in chronic kidney disease: Secondary | ICD-10-CM | POA: Insufficient documentation

## 2020-08-10 DIAGNOSIS — N183 Chronic kidney disease, stage 3 unspecified: Secondary | ICD-10-CM | POA: Insufficient documentation

## 2020-08-10 LAB — IRON AND TIBC
Iron: 47 ug/dL (ref 45–182)
Saturation Ratios: 17 % — ABNORMAL LOW (ref 17.9–39.5)
TIBC: 270 ug/dL (ref 250–450)
UIBC: 223 ug/dL

## 2020-08-10 LAB — RENAL FUNCTION PANEL
Albumin: 3.1 g/dL — ABNORMAL LOW (ref 3.5–5.0)
Anion gap: 9 (ref 5–15)
BUN: 54 mg/dL — ABNORMAL HIGH (ref 6–20)
CO2: 21 mmol/L — ABNORMAL LOW (ref 22–32)
Calcium: 8.9 mg/dL (ref 8.9–10.3)
Chloride: 105 mmol/L (ref 98–111)
Creatinine, Ser: 3.22 mg/dL — ABNORMAL HIGH (ref 0.61–1.24)
GFR, Estimated: 24 mL/min — ABNORMAL LOW (ref >60)
Glucose, Bld: 126 mg/dL — ABNORMAL HIGH (ref 70–99)
Phosphorus: 3.8 mg/dL (ref 2.5–4.6)
Potassium: 4 mmol/L (ref 3.5–5.1)
Sodium: 135 mmol/L (ref 135–145)

## 2020-08-10 LAB — FERRITIN: Ferritin: 362 ng/mL — ABNORMAL HIGH (ref 24–336)

## 2020-08-10 MED ORDER — DARBEPOETIN ALFA 200 MCG/0.4ML IJ SOSY: 200.0000 ug | PREFILLED_SYRINGE | INTRAMUSCULAR | Status: DC

## 2020-08-10 MED ORDER — DARBEPOETIN ALFA 200 MCG/0.4ML IJ SOSY
PREFILLED_SYRINGE | INTRAMUSCULAR | Status: AC
  Administered 2020-08-10: 200 ug via SUBCUTANEOUS
  Filled 2020-08-10: qty 0.4

## 2020-08-11 LAB — POCT HEMOGLOBIN-HEMACUE: Hemoglobin: 8.8 g/dL — ABNORMAL LOW (ref 13.0–17.0)

## 2020-08-11 LAB — PTH, INTACT AND CALCIUM
Calcium, Total (PTH): 9 mg/dL (ref 8.7–10.2)
PTH: 72 pg/mL — ABNORMAL HIGH (ref 15–65)

## 2020-08-12 DIAGNOSIS — E113312 Type 2 diabetes mellitus with moderate nonproliferative diabetic retinopathy with macular edema, left eye: Secondary | ICD-10-CM | POA: Diagnosis not present

## 2020-08-13 ENCOUNTER — Encounter (HOSPITAL_COMMUNITY): Payer: Self-pay

## 2020-08-13 ENCOUNTER — Encounter (HOSPITAL_COMMUNITY): Payer: Medicare Other

## 2020-08-19 ENCOUNTER — Other Ambulatory Visit: Payer: Self-pay

## 2020-08-19 ENCOUNTER — Telehealth: Payer: Self-pay | Admitting: Student

## 2020-08-19 ENCOUNTER — Inpatient Hospital Stay (HOSPITAL_COMMUNITY)
Admission: EM | Admit: 2020-08-19 | Discharge: 2020-08-23 | DRG: 291 | Disposition: A | Discharge status: Home / Self Care | Payer: Medicare Other | Attending: Internal Medicine | Admitting: Internal Medicine

## 2020-08-19 ENCOUNTER — Emergency Department (HOSPITAL_COMMUNITY): Payer: Medicare Other

## 2020-08-19 ENCOUNTER — Encounter (HOSPITAL_COMMUNITY): Payer: Self-pay

## 2020-08-19 DIAGNOSIS — E872 Acidosis: Secondary | ICD-10-CM | POA: Diagnosis not present

## 2020-08-19 DIAGNOSIS — F419 Anxiety disorder, unspecified: Secondary | ICD-10-CM | POA: Diagnosis present

## 2020-08-19 DIAGNOSIS — B961 Klebsiella pneumoniae [K. pneumoniae] as the cause of diseases classified elsewhere: Secondary | ICD-10-CM | POA: Diagnosis present

## 2020-08-19 DIAGNOSIS — R112 Nausea with vomiting, unspecified: Secondary | ICD-10-CM | POA: Diagnosis not present

## 2020-08-19 DIAGNOSIS — N3289 Other specified disorders of bladder: Secondary | ICD-10-CM | POA: Diagnosis not present

## 2020-08-19 DIAGNOSIS — D638 Anemia in other chronic diseases classified elsewhere: Secondary | ICD-10-CM | POA: Diagnosis present

## 2020-08-19 DIAGNOSIS — Z794 Long term (current) use of insulin: Secondary | ICD-10-CM

## 2020-08-19 DIAGNOSIS — M160 Bilateral primary osteoarthritis of hip: Secondary | ICD-10-CM | POA: Diagnosis not present

## 2020-08-19 DIAGNOSIS — Z89611 Acquired absence of right leg above knee: Secondary | ICD-10-CM

## 2020-08-19 DIAGNOSIS — I5033 Acute on chronic diastolic (congestive) heart failure: Secondary | ICD-10-CM | POA: Diagnosis present

## 2020-08-19 DIAGNOSIS — K8020 Calculus of gallbladder without cholecystitis without obstruction: Secondary | ICD-10-CM | POA: Diagnosis not present

## 2020-08-19 DIAGNOSIS — I130 Hypertensive heart and chronic kidney disease with heart failure and stage 1 through stage 4 chronic kidney disease, or unspecified chronic kidney disease: Principal | ICD-10-CM | POA: Diagnosis present

## 2020-08-19 DIAGNOSIS — Z86718 Personal history of other venous thrombosis and embolism: Secondary | ICD-10-CM

## 2020-08-19 DIAGNOSIS — R0902 Hypoxemia: Secondary | ICD-10-CM | POA: Diagnosis not present

## 2020-08-19 DIAGNOSIS — I509 Heart failure, unspecified: Secondary | ICD-10-CM

## 2020-08-19 DIAGNOSIS — Z20822 Contact with and (suspected) exposure to covid-19: Secondary | ICD-10-CM | POA: Diagnosis not present

## 2020-08-19 DIAGNOSIS — K219 Gastro-esophageal reflux disease without esophagitis: Secondary | ICD-10-CM | POA: Diagnosis present

## 2020-08-19 DIAGNOSIS — E1122 Type 2 diabetes mellitus with diabetic chronic kidney disease: Secondary | ICD-10-CM | POA: Diagnosis not present

## 2020-08-19 DIAGNOSIS — K8689 Other specified diseases of pancreas: Secondary | ICD-10-CM | POA: Diagnosis present

## 2020-08-19 DIAGNOSIS — Z86711 Personal history of pulmonary embolism: Secondary | ICD-10-CM

## 2020-08-19 DIAGNOSIS — R0602 Shortness of breath: Secondary | ICD-10-CM

## 2020-08-19 DIAGNOSIS — Z8249 Family history of ischemic heart disease and other diseases of the circulatory system: Secondary | ICD-10-CM

## 2020-08-19 DIAGNOSIS — Z888 Allergy status to other drugs, medicaments and biological substances status: Secondary | ICD-10-CM

## 2020-08-19 DIAGNOSIS — I10 Essential (primary) hypertension: Secondary | ICD-10-CM | POA: Diagnosis not present

## 2020-08-19 DIAGNOSIS — Z833 Family history of diabetes mellitus: Secondary | ICD-10-CM

## 2020-08-19 DIAGNOSIS — Z86010 Personal history of colonic polyps: Secondary | ICD-10-CM

## 2020-08-19 DIAGNOSIS — N390 Urinary tract infection, site not specified: Secondary | ICD-10-CM | POA: Diagnosis present

## 2020-08-19 DIAGNOSIS — F32A Depression, unspecified: Secondary | ICD-10-CM | POA: Diagnosis present

## 2020-08-19 DIAGNOSIS — Z6841 Body Mass Index (BMI) 40.0 and over, adult: Secondary | ICD-10-CM

## 2020-08-19 DIAGNOSIS — Z89511 Acquired absence of right leg below knee: Secondary | ICD-10-CM

## 2020-08-19 DIAGNOSIS — Z20828 Contact with and (suspected) exposure to other viral communicable diseases: Secondary | ICD-10-CM | POA: Diagnosis not present

## 2020-08-19 DIAGNOSIS — G4733 Obstructive sleep apnea (adult) (pediatric): Secondary | ICD-10-CM | POA: Diagnosis present

## 2020-08-19 DIAGNOSIS — Z7901 Long term (current) use of anticoagulants: Secondary | ICD-10-CM

## 2020-08-19 DIAGNOSIS — N184 Chronic kidney disease, stage 4 (severe): Secondary | ICD-10-CM | POA: Diagnosis present

## 2020-08-19 DIAGNOSIS — Z79899 Other long term (current) drug therapy: Secondary | ICD-10-CM

## 2020-08-19 DIAGNOSIS — K8050 Calculus of bile duct without cholangitis or cholecystitis without obstruction: Secondary | ICD-10-CM | POA: Diagnosis not present

## 2020-08-19 DIAGNOSIS — J810 Acute pulmonary edema: Secondary | ICD-10-CM | POA: Diagnosis not present

## 2020-08-19 LAB — CBC WITH DIFFERENTIAL/PLATELET
Abs Immature Granulocytes: 0.06 10*3/uL (ref 0.00–0.07)
Basophils Absolute: 0 10*3/uL (ref 0.0–0.1)
Basophils Relative: 0 %
Eosinophils Absolute: 0.1 10*3/uL (ref 0.0–0.5)
Eosinophils Relative: 1 %
HCT: 27.8 % — ABNORMAL LOW (ref 39.0–52.0)
Hemoglobin: 8.7 g/dL — ABNORMAL LOW (ref 13.0–17.0)
Immature Granulocytes: 1 %
Lymphocytes Relative: 7 %
Lymphs Abs: 0.8 10*3/uL (ref 0.7–4.0)
MCH: 29 pg (ref 26.0–34.0)
MCHC: 31.3 g/dL (ref 30.0–36.0)
MCV: 92.7 fL (ref 80.0–100.0)
Monocytes Absolute: 1.2 10*3/uL — ABNORMAL HIGH (ref 0.1–1.0)
Monocytes Relative: 10 %
Neutro Abs: 9.4 10*3/uL — ABNORMAL HIGH (ref 1.7–7.7)
Neutrophils Relative %: 81 %
Platelets: 283 10*3/uL (ref 150–400)
RBC: 3 MIL/uL — ABNORMAL LOW (ref 4.22–5.81)
RDW: 14.6 % (ref 11.5–15.5)
WBC: 11.5 10*3/uL — ABNORMAL HIGH (ref 4.0–10.5)
nRBC: 0 % (ref 0.0–0.2)

## 2020-08-19 LAB — COMPREHENSIVE METABOLIC PANEL
ALT: 15 U/L (ref 0–44)
AST: 11 U/L — ABNORMAL LOW (ref 15–41)
Albumin: 3.2 g/dL — ABNORMAL LOW (ref 3.5–5.0)
Alkaline Phosphatase: 68 U/L (ref 38–126)
Anion gap: 8 (ref 5–15)
BUN: 67 mg/dL — ABNORMAL HIGH (ref 6–20)
CO2: 18 mmol/L — ABNORMAL LOW (ref 22–32)
Calcium: 9.3 mg/dL (ref 8.9–10.3)
Chloride: 111 mmol/L (ref 98–111)
Creatinine, Ser: 3.31 mg/dL — ABNORMAL HIGH (ref 0.61–1.24)
GFR, Estimated: 23 mL/min — ABNORMAL LOW (ref >60)
Glucose, Bld: 122 mg/dL — ABNORMAL HIGH (ref 70–99)
Potassium: 4.1 mmol/L (ref 3.5–5.1)
Sodium: 137 mmol/L (ref 135–145)
Total Bilirubin: 1.3 mg/dL — ABNORMAL HIGH (ref 0.3–1.2)
Total Protein: 7.6 g/dL (ref 6.5–8.1)

## 2020-08-19 LAB — RESP PANEL BY RT-PCR (FLU A&B, COVID) ARPGX2
Influenza A by PCR: NEGATIVE
Influenza B by PCR: NEGATIVE
SARS Coronavirus 2 by RT PCR: NEGATIVE

## 2020-08-19 LAB — BRAIN NATRIURETIC PEPTIDE: B Natriuretic Peptide: 606.3 pg/mL — ABNORMAL HIGH (ref 0.0–100.0)

## 2020-08-19 LAB — LIPASE, BLOOD: Lipase: 36 U/L (ref 11–51)

## 2020-08-19 NOTE — Telephone Encounter (Signed)
Received after hours page from Mr. Eakins concerning nausea and vomiting that started the morning of 08/18/2020. State for the past day he as had over a dozen episodes of emesis. States this started shortly after working out yesterday morning. Has been unable to keep down any food. Notes decreased urine output and tried to drink Pedialyte but unable to keep it down. Also endorses middle and lower abdominal pain that is cramping and constant since vomiting started. Feels episodes of vomiting have not been improving and abdominal pain is worsening.    Notes his blood sugar is 58 this morning and is generally feeling tired. Notes some dizziness. Last use tresiba 49 units yesterday morning, did give himself any novolog yesterday. He denies fever or chills, cough, dyspnea, chest pain,  hematemesis, dysuria, diarreha or constipation.   Reccommended he be evaluated in the ED given his continue episodes and nausea and vomiting, inability to tolerate po intake, and low blood sugars. Patient reluctant to be evaluated in the ED but eventually agreeable to calling EMS for further workup in the ED.

## 2020-08-19 NOTE — ED Triage Notes (Signed)
Pt c/o lower mid abdominal pain since Tuesday with nausea and vomiting. Pt endorses SOB since then as well. Denies CP. Room air saturation 94% during triage.

## 2020-08-19 NOTE — ED Provider Notes (Signed)
Emergency Medicine Provider Triage Evaluation Note  [name] , a 45 y.o. male  was evaluated in triage.  Pt complains of abdominal pain and shortness of breath.  Reports that abdominal pain began on Tuesday, he is located to upper and lower midline abdomen.  Patient endorses nausea and vomiting.  States that he has vomited 12 times in the last 24 hours.  Denies any hematemesis or coffee-ground emesis.  Patient reports that shortness of breath began yesterday.  Shortness of breath has gotten worse.  Patient denies any chest pain  Review of Systems  Positive: Abdominal pain, nausea, vomiting, shortness of breath, nonproductive cough Negative: Fevers, chills, constipation, diarrhea, rhinorrhea, nasal congestion  Physical Exam  BP (!) 164/84 (BP Location: Right Arm)   Pulse 90   Temp 99.6 F (37.6 C) (Oral)   Resp 20   Ht 6\' 2"  (1.88 m)   Wt (!) 152.9 kg   SpO2 95%   BMI 43.27 kg/m  Gen:   Awake, no distress   Resp:  Normal effort, lungs clear to auscultation bilaterally MSK:   Moves extremities without difficulty, right AKA, Other:  Abdomen soft, nondistended, tenderness to midline upper and lower quadrant.  Medical Decision Making  Medically screening exam initiated at 6:16 PM.  Appropriate orders placed.  [name] was informed that the remainder of the evaluation will be completed by another provider, this initial triage assessment does not replace that evaluation, and the importance of remaining in the ED until their evaluation is complete.  The patient appears stable so that the remainder of the work up may be completed by another provider.      Loni Beckwith, PA-C 08/19/20 1819    Drenda Freeze, MD 08/19/20 [phone]

## 2020-08-19 NOTE — ED Triage Notes (Signed)
Pt BIB [facility] from home, c/o nausea/vomiting, fatigue and cough. SpO2--90%, improved on 4LNC

## 2020-08-20 ENCOUNTER — Emergency Department (HOSPITAL_COMMUNITY): Payer: Medicare Other

## 2020-08-20 ENCOUNTER — Encounter: Payer: Self-pay | Admitting: Gastroenterology

## 2020-08-20 ENCOUNTER — Encounter (HOSPITAL_COMMUNITY): Payer: Self-pay | Admitting: Internal Medicine

## 2020-08-20 ENCOUNTER — Observation Stay (HOSPITAL_COMMUNITY): Payer: Medicare Other

## 2020-08-20 DIAGNOSIS — K8689 Other specified diseases of pancreas: Secondary | ICD-10-CM | POA: Diagnosis not present

## 2020-08-20 DIAGNOSIS — R109 Unspecified abdominal pain: Secondary | ICD-10-CM | POA: Diagnosis not present

## 2020-08-20 DIAGNOSIS — N3289 Other specified disorders of bladder: Secondary | ICD-10-CM | POA: Diagnosis not present

## 2020-08-20 DIAGNOSIS — M160 Bilateral primary osteoarthritis of hip: Secondary | ICD-10-CM | POA: Diagnosis not present

## 2020-08-20 DIAGNOSIS — K8020 Calculus of gallbladder without cholecystitis without obstruction: Secondary | ICD-10-CM | POA: Diagnosis not present

## 2020-08-20 DIAGNOSIS — I509 Heart failure, unspecified: Secondary | ICD-10-CM

## 2020-08-20 LAB — URINALYSIS, ROUTINE W REFLEX MICROSCOPIC
Bilirubin Urine: NEGATIVE
Glucose, UA: NEGATIVE mg/dL
Ketones, ur: NEGATIVE mg/dL
Nitrite: NEGATIVE
Protein, ur: >=300 mg/dL — AB
Specific Gravity, Urine: 1.01 (ref 1.005–1.030)
pH: 5 (ref 5.0–8.0)

## 2020-08-20 LAB — CBG MONITORING, ED
Glucose-Capillary: 128 mg/dL — ABNORMAL HIGH (ref 70–99)
Glucose-Capillary: 147 mg/dL — ABNORMAL HIGH (ref 70–99)

## 2020-08-20 LAB — GLUCOSE, CAPILLARY
Glucose-Capillary: 150 mg/dL — ABNORMAL HIGH (ref 70–99)
Glucose-Capillary: 176 mg/dL — ABNORMAL HIGH (ref 70–99)

## 2020-08-20 MED ORDER — INSULIN ASPART 100 UNIT/ML IJ SOLN
0.0000 [IU] | Freq: Three times a day (TID) | INTRAMUSCULAR | Status: DC
  Administered 2020-08-20 (×2): 2 [IU] via SUBCUTANEOUS
  Administered 2020-08-21 (×2): 3 [IU] via SUBCUTANEOUS

## 2020-08-20 MED ORDER — GABAPENTIN 100 MG PO CAPS
100.0000 mg | ORAL_CAPSULE | Freq: Three times a day (TID) | ORAL | Status: DC
  Administered 2020-08-20 – 2020-08-23 (×11): 100 mg via ORAL
  Filled 2020-08-20 (×11): qty 1

## 2020-08-20 MED ORDER — TAMSULOSIN HCL 0.4 MG PO CAPS
0.4000 mg | ORAL_CAPSULE | Freq: Every day | ORAL | Status: DC
  Administered 2020-08-20 – 2020-08-22 (×3): 0.4 mg via ORAL
  Filled 2020-08-20 (×3): qty 1

## 2020-08-20 MED ORDER — AMLODIPINE BESYLATE 10 MG PO TABS
10.0000 mg | ORAL_TABLET | Freq: Every day | ORAL | Status: DC
  Administered 2020-08-20 – 2020-08-22 (×3): 10 mg via ORAL
  Filled 2020-08-20 (×3): qty 1

## 2020-08-20 MED ORDER — HYDRALAZINE HCL 50 MG PO TABS
50.0000 mg | ORAL_TABLET | Freq: Three times a day (TID) | ORAL | Status: DC
  Administered 2020-08-20 – 2020-08-23 (×11): 50 mg via ORAL
  Filled 2020-08-20 (×8): qty 1
  Filled 2020-08-20: qty 2
  Filled 2020-08-20 (×2): qty 1

## 2020-08-20 MED ORDER — FUROSEMIDE 10 MG/ML IJ SOLN
80.0000 mg | Freq: Once | INTRAMUSCULAR | Status: AC
  Administered 2020-08-20: 80 mg via INTRAVENOUS
  Filled 2020-08-20: qty 8

## 2020-08-20 MED ORDER — INSULIN GLARGINE-YFGN 100 UNIT/ML ~~LOC~~ SOLN
25.0000 [IU] | Freq: Every day | SUBCUTANEOUS | Status: DC
  Administered 2020-08-20 – 2020-08-22 (×3): 25 [IU] via SUBCUTANEOUS
  Filled 2020-08-20 (×4): qty 0.25

## 2020-08-20 MED ORDER — SODIUM CHLORIDE 0.9% FLUSH
3.0000 mL | Freq: Two times a day (BID) | INTRAVENOUS | Status: DC
  Administered 2020-08-20 – 2020-08-23 (×7): 3 mL via INTRAVENOUS

## 2020-08-20 MED ORDER — ADULT MULTIVITAMIN W/MINERALS CH
1.0000 | ORAL_TABLET | Freq: Every day | ORAL | Status: DC
  Administered 2020-08-20 – 2020-08-23 (×4): 1 via ORAL
  Filled 2020-08-20 (×4): qty 1

## 2020-08-20 MED ORDER — APIXABAN 5 MG PO TABS
5.0000 mg | ORAL_TABLET | Freq: Two times a day (BID) | ORAL | Status: DC
  Administered 2020-08-20 – 2020-08-23 (×7): 5 mg via ORAL
  Filled 2020-08-20 (×7): qty 1

## 2020-08-20 MED ORDER — OXYCODONE HCL 5 MG PO TABS
5.0000 mg | ORAL_TABLET | Freq: Once | ORAL | Status: AC
  Administered 2020-08-21: 5 mg via ORAL
  Filled 2020-08-20: qty 1

## 2020-08-20 MED ORDER — ACETAMINOPHEN 650 MG RE SUPP: 650.0000 mg | Freq: Four times a day (QID) | RECTAL | Status: DC | PRN

## 2020-08-20 MED ORDER — CARVEDILOL 25 MG PO TABS
25.0000 mg | ORAL_TABLET | Freq: Two times a day (BID) | ORAL | Status: DC
  Administered 2020-08-20 – 2020-08-23 (×7): 25 mg via ORAL
  Filled 2020-08-20 (×2): qty 1
  Filled 2020-08-20: qty 2
  Filled 2020-08-20 (×4): qty 1

## 2020-08-20 MED ORDER — SODIUM CHLORIDE 0.9 % IV SOLN
1.0000 g | INTRAVENOUS | Status: DC
  Administered 2020-08-20 – 2020-08-23 (×4): 1 g via INTRAVENOUS
  Filled 2020-08-20 (×4): qty 10

## 2020-08-20 MED ORDER — POLYETHYLENE GLYCOL 3350 17 G PO PACK: 17.0000 g | PACK | Freq: Every day | ORAL | Status: DC | PRN

## 2020-08-20 MED ORDER — ACETAMINOPHEN 325 MG PO TABS
650.0000 mg | ORAL_TABLET | Freq: Four times a day (QID) | ORAL | Status: DC | PRN
  Administered 2020-08-20 – 2020-08-22 (×2): 650 mg via ORAL
  Filled 2020-08-20 (×2): qty 2

## 2020-08-20 MED ORDER — ONDANSETRON HCL 4 MG PO TABS: 4.0000 mg | ORAL_TABLET | Freq: Four times a day (QID) | ORAL | Status: DC | PRN

## 2020-08-20 MED ORDER — PROMETHAZINE HCL 25 MG PO TABS: 12.5000 mg | ORAL_TABLET | Freq: Four times a day (QID) | ORAL | Status: DC | PRN

## 2020-08-20 MED ORDER — ONDANSETRON HCL 4 MG/2ML IJ SOLN
4.0000 mg | Freq: Four times a day (QID) | INTRAMUSCULAR | Status: DC | PRN
  Administered 2020-08-20: 4 mg via INTRAVENOUS
  Filled 2020-08-20: qty 2

## 2020-08-20 MED ORDER — OXYBUTYNIN CHLORIDE 5 MG PO TABS
5.0000 mg | ORAL_TABLET | Freq: Three times a day (TID) | ORAL | Status: DC
  Administered 2020-08-20 – 2020-08-23 (×11): 5 mg via ORAL
  Filled 2020-08-20 (×11): qty 1

## 2020-08-20 MED ORDER — FUROSEMIDE 10 MG/ML IJ SOLN
80.0000 mg | Freq: Two times a day (BID) | INTRAMUSCULAR | Status: DC
  Administered 2020-08-20 – 2020-08-22 (×5): 80 mg via INTRAVENOUS
  Filled 2020-08-20 (×5): qty 8

## 2020-08-20 MED ORDER — SODIUM CHLORIDE 0.9 % IV SOLN
12.5000 mg | Freq: Once | INTRAVENOUS | Status: AC
  Administered 2020-08-20: 12.5 mg via INTRAVENOUS
  Filled 2020-08-20: qty 0.5

## 2020-08-20 MED ORDER — FUROSEMIDE 10 MG/ML IJ SOLN: 80.0000 mg | Freq: Two times a day (BID) | INTRAMUSCULAR | Status: DC

## 2020-08-20 NOTE — H&P (Signed)
Date: 08/20/2020               Patient Name:  [name] MRN: [id number]  DOB: 1975-10-26 Age / Sex: 45 y.o., male   PCP: [name], MD         Medical Service: Internal Medicine Teaching Service         Attending Physician: Dr. Jimmye Norman, Elaina Pattee, MD    First Contact: Dr. Jeanice Lim Pager: [phone]  Second Contact: Dr. Allyson Sabal Pager: [phone]       After Hours (After 5p/  First Contact Pager: [phone]  weekends / holidays): Second Contact Pager: [phone]   Chief Complaint: SOB, nausea, vomiting   History of Present Illness:   [name] is a 45 y/o gentleman with a PMHx of HFpEF with EF of 60-65%, Type 2 diabetes on long-term insulin, CKD Stage 4, DVT/PE on chronic anti-coagulation, HTN, OSA, who presents to the ED with c/o SOB, nausea, vomiting.   [name] states that on August 9th, he developed progressively worsening shortness of breath and orthopnea.  He noticed that it occurred with increased swelling in his leg. He endorses possible increased distention of his stomach but is unsure if it is secondary to fluid retention.  On August 11th in the morning, he began to experience nausea and subsequent vomiting with difficulty tolerating p.o. intake. His emesis was non-bloody. Shortly afterwards, he developed diffuse abdominal pain. Due to difficulty with PO intake and continued SOB, he presented to the ED in the evening of August 11th. He denies any fever, chills, sore throat, congestion, chest pain, palpitations, melena, hematochezia, weakness. He is urinating less than usual but this increased after Lasix dose in the ER.   [name] notes that he stopped taking Trimethoprim for chronic UTI at the recommendation of Nephrology. He has not followed up with Urology since stopping the medication.   [name] continues to work out 6 days a week and follows a plant-based diet with the recent addition of tuna (canned) in order to increase his iron daily intake. He  believes the tuna he is purchasing is low sodium.   ED Course:  On arrival to the ED, patient was noted to be hypertensive at 164/84 with heart rate of 90. He was saturating at 95% on room air with respiratory rate of 20 that increased to 22. He is afebrile at 99.6. Initial lab work demonstrated a leukocytosis of 11.5 with hemoglobin of 8.7. CMP showed a non-AG acidosis but otherwise stable compared to prior. BNP elevated at 606. CXR demonstrated vascular congestion with GGO consistent with edema. CT A/P remarkable for GGO in bilateral bases, cholelithiasis, chronic pancreatic atrophy and moderate colonic stool burden. IMTS was consulted for admission due to increased work of breathing not resolved with Lasix.   Meds:  No current facility-administered medications on file prior to encounter.   Current Outpatient Medications on File Prior to Encounter  Medication Sig Dispense Refill   acidophilus (RISAQUAD) CAPS capsule Take 1 capsule by mouth daily.     Alpha-Lipoic Acid 600 MG CAPS Take 600 mg by mouth daily.     amLODipine (NORVASC) 10 MG tablet Take 10 mg by mouth at bedtime.     carvedilol (COREG) 25 MG tablet Take 1 tablet (25 mg total) by mouth 2 (two) times daily with a meal. 180 tablet 1   Cyanocobalamin (B-12) 500 MCG SUBL Place 500 mcg under the tongue daily.      ELIQUIS 5 MG TABS  tablet Take 1 tablet by mouth twice daily (Patient taking differently: Take 5 mg by mouth 2 (two) times daily.) 180 tablet 0   famotidine (PEPCID) 20 MG tablet Take 1 tablet (20 mg total) by mouth 2 (two) times daily. (Patient taking differently: Take 20 mg by mouth as needed for heartburn or indigestion.) 30 tablet 0   fluticasone (FLONASE) 50 MCG/ACT nasal spray Place 2 sprays into both nostrils daily. (Patient taking differently: Place 2 sprays into both nostrils daily as needed for allergies or rhinitis.) 15.8 mL 2   furosemide (LASIX) 80 MG tablet Take 1 tablet by mouth twice daily (Patient taking  differently: Take 80-160 mg by mouth 2 (two) times daily. 163m in the morning and 861min the evening.) 180 tablet 0   gabapentin (NEURONTIN) 100 MG capsule TAKE 1 CAPSULE BY MOUTH THREE TIMES DAILY (Patient taking differently: Take 100 mg by mouth 3 (three) times daily.) 270 capsule 0   hydrALAZINE (APRESOLINE) 50 MG tablet TAKE 1 TABLET BY MOUTH THREE TIMES DAILY (Patient taking differently: Take 50 mg by mouth 3 (three) times daily.) 90 tablet 0   insulin aspart (NOVOLOG) 100 UNIT/ML FlexPen Administer 5 units in the morning with breakfast, 20 units with lunch, and 25 units with dinner daily.  ICD Code:  E11.9 15 mL 11   insulin degludec (TRESIBA FLEXTOUCH) 200 UNIT/ML FlexTouch Pen INJECT 45 UNITS SUBCUTANEOUSLY ONCE DAILY (Patient taking differently: Inject 45 Units into the skin daily.) 45 mL 1   lovastatin (MEVACOR) 20 MG tablet Take 1 tablet by mouth once daily (Patient taking differently: Take 20 mg by mouth daily.) 90 tablet 1   oxybutynin (DITROPAN) 5 MG tablet Take 5 mg by mouth 3 (three) times daily.      polyethylene glycol (MIRALAX / GLYCOLAX) 17 g packet Take 17 g by mouth 2 (two) times daily. (Patient taking differently: Take 17 g by mouth as needed for mild constipation.) 14 each 0   potassium chloride SA (KLOR-CON) 20 MEQ tablet Take 20 mEq by mouth 2 (two) times daily.     senna-docusate (SENOKOT-S) 8.6-50 MG tablet Take 1 tablet by mouth daily. (Patient taking differently: Take 1 tablet by mouth as needed for mild constipation.) 90 tablet 3   sildenafil (VIAGRA) 50 MG tablet Take 50 mg by mouth as needed for erectile dysfunction.     tamsulosin (FLOMAX) 0.4 MG CAPS capsule Take 0.4 mg by mouth at bedtime.      VICTOZA 18 MG/3ML SOPN INJECT 1.2 MG INTO THE SKIN ONCE DAILY AT THE SAME TIME. (Patient taking differently: Inject 1.2 mg into the skin daily in the afternoon.) 6 mL 0   Accu-Chek Softclix Lancets lancets Check blood sugar up to 3 times a day (Patient taking differently: 1  each by Other route See admin instructions. Check blood sugar up to 4-5 times a day) 300 each 3   Blood Glucose Monitoring Suppl (ACCU-CHEK GUIDE ME) w/Device KIT Use to check blood sugar up to 3 times a day (Patient taking differently: 1 each by Other route See admin instructions. Use to check blood sugar up to 4-5 times a day) 1 kit 1   glucose blood (ACCU-CHEK GUIDE) test strip Check blood sugar up to 3 times a day (Patient taking differently: 1 each by Other route See admin instructions. Check blood sugar up to 4-5 times a day) 300 each 3   Insulin Pen Needle (B-D UF III MINI PEN NEEDLES) 31G X 5 MM MISC 1 each by  Other route See admin instructions. USE 4-5 TIMES DAILY 200 each 0   Insulin Pen Needle (PEN NEEDLES 3/16") 31G X 5 MM MISC Use five times daily 390 each 3   Lancet Devices (ACCU-CHEK SOFTCLIX) lancets Use to check blood sugars 5 times a day. Dx code:E11.9. Insulin dependent. (Patient taking differently: 1 each by Other route See admin instructions. Use to check blood sugars 5 times a day. Dx code:E11.9. Insulin dependent.) 6 each 3   ondansetron (ZOFRAN) 4 MG tablet Take 1 tablet (4 mg total) by mouth every 6 (six) hours as needed for nausea. (Patient not taking: Reported on 08/20/2020) 20 tablet 0   Allergies: Allergies as of 08/19/2020 - Review Complete 08/19/2020  Allergen Reaction Noted   Reglan [metoclopramide] Other (See Comments) 11/01/2016   Metformin and related Diarrhea 08/19/2019   Ozempic (0.25 or 0.5 mg-dose) [semaglutide(0.25 or 0.62m-dos)] Nausea And Vomiting 08/19/2019   Past Medical History:  Diagnosis Date   Acute venous embolism and thrombosis of deep vessels of proximal lower extremity ([facility] 07/19/2011   Anemia    Anginal pain (HCC)    pt denies   Anxiety    Chest pain, neg MI, normal coronaries by cath 02/18/2013   pt denies   CHF (congestive heart failure) (HCC)    CKD (chronic kidney disease) stage 3, GFR 30-59 ml/min ([facility] 02/19/2013   Colon polyps     Depression    Di[place] of right [place]    DVT (deep venous thrombosis) ([facility] 09/2002   patient reports additional DVTs in '06 & '11 (unconfirmed)   ED (erectile dysfunction)    GERD (gastroesophageal reflux disease)    History of blood transfusion    "related to OR" (10/31/2016)   Hyperlipidemia 02/19/2013   Hypertension    Nausea & vomiting    "constant for the last couple weeks" (10/31/2016)   Nephrotic syndrome    Obesity    BMI 44, weight 346 pounds 01/30/14   OSA (obstructive sleep apnea) 02/28/2018   Mild obstructive sleep apnea with an AHI of 9.8/h but severe during REM sleep with an AHI of 33.8/h.  Oxygen saturations dropped to 86% and there was moderate snoring   Peripheral edema    Pneumonia    Prosthesis adjustment 08/17/2016   Pulmonary embolism ([facility] 09/2002   treated with 6 months of warfarin   Pyelonephritis 02/02/2018   Type I diabetes mellitus ([facility] 2001   Urinary retention    Family History:  Family History  Problem Relation Age of Onset   Heart disease Mother    Diabetes Mother    Diabetes Maternal Uncle    Diabetes Cousin    [place]  He denies any tobacco, drug or alcohol use.   Review of Systems: A complete ROS was negative except as per HPI.   Physical Exam: Blood pressure (!) 156/81, pulse 87, temperature 99.1 F (37.3 C), resp. rate (!) 22, height 6' 2"  (1.88 m), weight (!) 152.9 kg, SpO2 92 %.  Physical Exam Vitals and nursing note reviewed.  Constitutional:      General: He is awake.     Appearance: He is morbidly obese.  HENT:     Head: Normocephalic and atraumatic.     Mouth/Throat:     Mouth: Mucous membranes are moist.     Pharynx: Oropharynx is clear.  Eyes:     Extraocular Movements: Extraocular movements intact.     Conjunctiva/sclera: Conjunctivae normal.  Pupils: Pupils are equal, round, and reactive to light.  Neck:     Vascular: JVD present.  Cardiovascular:     Rate and  Rhythm: Normal rate and regular rhythm.     Heart sounds: No murmur heard.   No gallop.  Pulmonary:     Effort: Tachypnea present. No respiratory distress.     Breath sounds: Decreased breath sounds (markedly reduced breathe sounds throughout bilateral lower and middle lung fields.) present.     Comments: Unable to lay down more than ~70 degree angle due to orthopnea.  Abdominal:     General: Bowel sounds are normal. There is no distension.     Palpations: Abdomen is soft.     Tenderness: There is generalized abdominal tenderness. There is no guarding.  Musculoskeletal:     Left lower leg: 2+ Pitting Edema present.     Right Lower Extremity: Right leg is amputated above knee.  Skin:    General: Skin is warm and dry.  Neurological:     General: No focal deficit present.     Mental Status: He is alert and oriented to person, place, and time.  Psychiatric:        Mood and Affect: Mood normal.        Behavior: Behavior normal. Behavior is cooperative.   EKG (8/12): personally reviewed my interpretation is: sinus rhythm with let axis deviation. No significant changes from EKG in November 2021.   CXR: personally reviewed my interpretation is: Diffuse opacities consistent with edema. Left costophrenic angle obscured concerning for small pleural effusion.   Assessment & Plan by Problem: Active Problems:   Acute on chronic heart failure [facility])  [name] is a 45 y/o gentleman with a PMHx of HFpEF with EF of 60-65%, Type 2 diabetes on long-term insulin, CKD Stage 4, gastroparesis, DVT/PE on chronic anti-coagulation, HTN, OSA currently admitted for acute on chronic HF exacerbation complicated by emesis and concern for pyelonephritis.   # Acute on Chronic HFpEF Echo in May 2021 demonstrates EF of 60-65% with no RWMA. Multiple hospitalizations for acute on chronic HF requiring IV Lasix. Previously on Metolazone but discontinued. On examination, patient is unable to lay down at all with  markedly decreased breathe sounds. He has + JVD with 2+ pitting edema. CXR with pulmonary edema. Trigger for hypervolemia at this time undetermined but may be secondary to new addition of canned tuna to his diet. Dry weight is unknown as patient is actively working to lose weight by working out and diet modification.  - Telemetry  - IV Lasix 80 mg BID - Maintain K > 4.0 and Mg > 2.0 - Strict in/out - Daily weights  # Diffuse Abdominal Pain # Emesis # Non-Anion Gap Metabolic Acidosis  Per chart review, patient has a history of recurring N/V, abdominal pain attributed to constipation +/- gastroparesis. Previous work up with EGD negative. Unfortunately, patient has been unable to tolerate Reglan in the past.   CT A/P today demonstrates chronic biliary sludge +/- stones, although no evidence of cholecystitis. However, patient's total bilirubin is mildly elevated raising the concern GI symptoms are secondary to biliary colic. No ductal dilation seen on CT however will order a RUQ U/S for better evaluation to rule out intraductal stone.   - Zofran 4 mg IV or PO q6h PRN  - RUQ U/S  # Klebsiella Pneumoniae Bacteriuria Urine cultures positive for K. Pneumoniae since August 2020 with multiple rounds of antibiotics. Patient previously on suppressive Trimethoprim but no  longer taking due to CKD. CT A/P on admission with bilateral peri-nephritic stranding that is new concerning for pyelonephritis, presumably from K. Pneumoniae.   - Urine culture to evaluate resistance pattern  - Start Ceftriaxone 1 g QD  # Chronic Kidney Disease Stage 4 Follows with [facility], Dr. Joelyn Oms. Renal function stable at this time.   - Trend renal function while diuresing   # Hypertension  Home regimen includes Hydralazine 50 mg TID, Coreg 25 mg BID and Amlodipine 10 mg QD.   - Resume home medications  # Type 2 Diabetes, Insulin-dependent  Follows with Endocrinology, Dr. Dwyane Dee. Last A1c of 5.5% 1 month ago.  Current regimen includes Victoza and [place].   - SS (moderate) - Lantus 25 units QHS, titrate with goal CBGs 140-180  Diet: Heart Healthy, CM VTE:  Eliquis IVF: None,None Code: Full  Prior to Admission Living Arrangement: Home, living alone Anticipated Discharge Location: Home Barriers to Discharge: Continued medical evaluation and stabilization  Dispo: Admit patient to Observation with expected length of stay less than 2 midnights.  Signed: Dr. Jose Persia Internal Medicine PGY-3 Pager: [phone] After 5pm on weekdays and 1pm on weekends: On Call pager [phone]  08/20/2020, 10:03 AM

## 2020-08-20 NOTE — Progress Notes (Signed)
Pt still complaining abdominal  pain 7/10. Pt also nauseous- meds as per mar given. Paged IM about pt pain.

## 2020-08-20 NOTE — ED Notes (Signed)
Nurse report given to Velia, RN  

## 2020-08-20 NOTE — Progress Notes (Signed)
Patient  c/o 6/10 abdominal pain. Refused PRN Tylenol, stats " I know it won't help". He is requesting dilaudid. Internal Medicine paged.

## 2020-08-20 NOTE — ED Provider Notes (Signed)
[facility] EMERGENCY DEPARTMENT Provider Note   CSN: [id number] Arrival date & time: 08/19/20  1726     History Chief Complaint  Patient presents with   Abdominal Pain   Shortness of Breath    [name] is a 45 y.o. male.   Abdominal Pain Pain location:  Epigastric and suprapubic Pain quality: aching and sharp   Pain radiates to:  Does not radiate Pain severity:  Mild Onset quality:  Gradual Timing:  Constant Relieved by:  None tried Associated symptoms: shortness of breath   Shortness of Breath Severity:  Moderate Onset quality:  Gradual Duration:  3 days Timing:  Constant Progression:  Worsening Chronicity:  New Context: not activity, not fumes and not strong odors   Relieved by:  None tried Associated symptoms: abdominal pain       Past Medical History:  Diagnosis Date   Acute venous embolism and thrombosis of deep vessels of proximal lower extremity (HCC) 07/19/2011   Anemia    Anginal pain (HCC)    pt denies   Anxiety    Chest pain, neg MI, normal coronaries by cath 02/18/2013   pt denies   CHF (congestive heart failure) (HCC)    CKD (chronic kidney disease) stage 3, GFR 30-59 ml/min ([place]) 02/19/2013   Colon polyps    Depression    Diabetic ulcer of right foot ([place])    DVT (deep venous thrombosis) ([place]) 09/2002   patient reports additional DVTs in '06 & '11 (unconfirmed)   ED (erectile dysfunction)    GERD (gastroesophageal reflux disease)    History of blood transfusion    "related to OR" (10/31/2016)   Hyperlipidemia 02/19/2013   Hypertension    Nausea & vomiting    "constant for the last couple weeks" (10/31/2016)   Nephrotic syndrome    Obesity    BMI 44, weight 346 pounds 01/30/14   OSA (obstructive sleep apnea) 02/28/2018   Mild obstructive sleep apnea with an AHI of 9.8/h but severe during REM sleep with an AHI of 33.8/h.  Oxygen saturations dropped to 86% and there was moderate snoring   Peripheral edema    Pneumonia     Prosthesis adjustment 08/17/2016   Pulmonary embolism ([place]) 09/2002   treated with 6 months of warfarin   Pyelonephritis 02/02/2018   Type I diabetes mellitus ([place]) dx'd 2001   Urinary retention     Patient Active Problem List   Diagnosis Date Noted   Left hip pain 07/10/2020   Trapezius strain, right, initial encounter 05/20/2020   Anemia associated with chronic renal failure    CKD (chronic kidney disease)    Hemoglobinuria 12/12/2018   OSA (obstructive sleep apnea) 02/28/2018   Chronic diastolic heart failure ([place]) 02/02/2018   Healthcare maintenance 08/15/2017   Erectile dysfunction 04/22/2017   History of pulmonary embolus (PE) 06/09/2016   Depression 05/04/2016   Hypogonadism in male 05/04/2016   Morbid obesity with BMI of 40.0-44.9, adult ([place]) 10/14/2015   Dyslipidemia 08/12/2014   Uncontrolled type 2 diabetes mellitus with hyperglycemia, with long-term current use of insulin ([place]) 08/12/2014   Unilateral AKA, right ([place]) 12/26/2013   Hyperlipidemia 02/19/2013   S/P cardiac cath, 02/18/13, normal coronaries 02/19/2013   Chronic renal insufficiency, stage 3 (moderate) ([place]) 02/19/2013   Hypertension 07/24/2011   Long term current use of anticoagulant therapy 07/19/2011   History of DVT (deep vein thrombosis) 07/28/2009   Morbid obesity ([place]) 10/26/2005   Insulin dependent type 2 diabetes mellitus, controlled ([place])  01/09/2002    Past Surgical History:  Procedure Laterality Date   AMPUTATION Right 12/22/2013   Procedure: AMPUTATION BELOW KNEE;  Surgeon: [name], MD;  Location: [facility];  Service: Orthopedics;  Laterality: Right;   AMPUTATION Right 02/19/2014   Procedure: RIGHT ABOVE KNEE AMPUTATION ;  Surgeon: [name], MD;  Location: [facility];  Service: Orthopedics;  Laterality: Right;   blood clot removal  2016   CARDIAC CATHETERIZATION  02/18/2013   normal coronaries   ESOPHAGOGASTRODUODENOSCOPY N/A 11/02/2016   Procedure: ESOPHAGOGASTRODUODENOSCOPY (EGD);  Surgeon:  [name], MD;  Location[facility] ENDOSCOPY;  Service: Endoscopy;  Laterality: N/A;   I & D EXTREMITY Right 12/14/2013   Procedure: IRRIGATION AND DEBRIDEMENT RIGHT FOOT;  Surgeon: [name], MD;  Location: [facility];  Service: Orthopedics;  Laterality: Right;   INCISION AND DRAINAGE ABSCESS  2007; 2015   "back"   INCISION AND DRAINAGE OF WOUND Right 12/22/2013   Procedure: I&D RIGHT BUTTOCK;  Surgeon: [name], MD;  Location: [facility];  Service: Orthopedics;  Laterality: Right;   LEFT HEART CATHETERIZATION WITH CORONARY ANGIOGRAM N/A 02/18/2013   Procedure: LEFT HEART CATHETERIZATION WITH CORONARY ANGIOGRAM;  Surgeon: [name], MD;  Location: [facility] CATH LAB;  Service: Cardiovascular;  Laterality: N/A;       Family History  Problem Relation Age of Onset   Heart disease Mother    Diabetes Mother    Diabetes Maternal Uncle    Diabetes Cousin     Social History   Tobacco Use   Smoking status: Never   Smokeless tobacco: Never  Vaping Use   Vaping Use: Never used  Substance Use Topics   Alcohol use: Yes    Comment: 10/31/2016 "a few beers q 6 months or so"   Drug use: No    Home Medications Prior to Admission medications   Medication Sig Start Date End Date Taking? Authorizing Provider  Accu-Chek Softclix Lancets lancets Check blood sugar up to 3 times a day Patient taking differently: 1 each by Other route See admin instructions. Check blood sugar up to 4-5 times a day 05/08/19   Velna Ochs, MD  acidophilus (RISAQUAD) CAPS capsule Take 1 capsule by mouth daily.    [provider]  Alpha-Lipoic Acid 600 MG CAPS Take 600 mg by mouth daily.    [provider]  amLODipine (NORVASC) 10 MG tablet Take 10 mg by mouth at bedtime.    [provider]  Blood Glucose Monitoring Suppl (ACCU-CHEK GUIDE ME) w/Device KIT Use to check blood sugar up to 3 times a day Patient taking differently: 1 each by Other route See admin instructions. Use to check blood  sugar up to 4-5 times a day 05/06/19   [name], MD  carvedilol (COREG) 25 MG tablet Take 1 tablet (25 mg total) by mouth 2 (two) times daily with a meal. 12/11/19 03/10/20  [name], MD  Cyanocobalamin (B-12) 500 MCG SUBL Place 500 mcg under the tongue daily.     [provider]  ELIQUIS 5 MG TABS tablet Take 1 tablet by mouth twice daily 06/09/20   Cato Mulligan, MD  famotidine (PEPCID) 20 MG tablet Take 1 tablet (20 mg total) by mouth 2 (two) times daily. Patient taking differently: Take 20 mg by mouth as needed for heartburn or indigestion. 05/07/18   Hedges, Dellis Filbert, PA-C  fluticasone (FLONASE) 50 MCG/ACT nasal spray Place 2 sprays into both nostrils daily. 03/29/20 03/29/21  [name], MD  furosemide (  LASIX) 80 MG tablet Take 1 tablet by mouth twice daily 01/15/20   Marianna Payment, MD  gabapentin (NEURONTIN) 100 MG capsule TAKE 1 CAPSULE BY MOUTH THREE TIMES DAILY 06/16/20   Cato Mulligan, MD  glucose blood (ACCU-CHEK GUIDE) test strip Check blood sugar up to 3 times a day Patient taking differently: 1 each by Other route See admin instructions. Check blood sugar up to 4-5 times a day 05/08/19   Velna Ochs, MD  hydrALAZINE (APRESOLINE) 50 MG tablet TAKE 1 TABLET BY MOUTH THREE TIMES DAILY 04/12/20   Lacinda Axon, MD  insulin aspart (NOVOLOG) 100 UNIT/ML FlexPen Administer 5 units in the morning with breakfast, 20 units with lunch, and 25 units with dinner daily.  ICD Code:  E11.9 02/24/20   Cato Mulligan, MD  insulin degludec (TRESIBA FLEXTOUCH) 200 UNIT/ML FlexTouch Pen INJECT 45 UNITS SUBCUTANEOUSLY ONCE DAILY 02/19/20   Virl Axe, MD  Insulin Pen Needle (B-D UF III MINI PEN NEEDLES) 31G X 5 MM MISC 1 each by Other route See admin instructions. USE 4-5 TIMES DAILY 03/08/20   Cato Mulligan, MD  Insulin Pen Needle (PEN NEEDLES 3/16") 31G X 5 MM MISC Use five times daily 03/08/20   Cato Mulligan, MD  Lancet Devices [facility]) lancets Use to  check blood sugars 5 times a day. Dx code:E11.9. Insulin dependent. Patient taking differently: 1 each by Other route See admin instructions. Use to check blood sugars 5 times a day. Dx code:E11.9. Insulin dependent. 08/14/16   [name], DO  lovastatin (MEVACOR) 20 MG tablet Take 1 tablet by mouth once daily 04/08/20   Seawell, Jaimie A, DO  Omega-3 Fatty Acids (FISH OIL) 1000 MG CAPS Take 1,000 mg by mouth at bedtime.     [provider]  ondansetron (ZOFRAN) 4 MG tablet Take 1 tablet (4 mg total) by mouth every 6 (six) hours as needed for nausea. 09/02/19   [place], Carley D, DO  oxybutynin (DITROPAN) 5 MG tablet Take 5 mg by mouth 3 (three) times daily.     [provider]  polyethylene glycol (MIRALAX / GLYCOLAX) 17 g packet Take 17 g by mouth 2 (two) times daily. Patient taking differently: Take 17 g by mouth as needed for mild constipation. 09/02/19   [place]omfield, Carley D, DO  potassium chloride SA (KLOR-CON) 20 MEQ tablet Take 20 mEq by mouth 2 (two) times daily. 11/24/19   [provider]  senna-docusate (SENOKOT-S) 8.6-50 MG tablet Take 1 tablet by mouth daily. Patient taking differently: Take 1 tablet by mouth as needed for mild constipation. 09/26/19   [name], MD  sildenafil (VIAGRA) 50 MG tablet Take 50 mg by mouth as needed for erectile dysfunction. 11/16/19   [provider]  tamsulosin (FLOMAX) 0.4 MG CAPS capsule Take 0.4 mg by mouth at bedtime.     [provider]  VICTOZA 18 MG/3ML SOPN INJECT 1.2 MG INTO THE SKIN ONCE DAILY AT THE SAME TIME. 07/02/20   [name], MD    Allergies    Reglan [metoclopramide], Metformin and related, and Ozempic (0.25 or 0.5 mg-dose) [semaglutide(0.25 or 0.26m-dos)]  Review of Systems   Review of Systems  Respiratory:  Positive for shortness of breath.   Gastrointestinal:  Positive for abdominal pain.  All other systems reviewed and are negative.  Physical Exam Updated Vital  Signs BP (!) 156/81 (BP Location: Left Arm)   Pulse 87   Temp 99.1 F (37.3 C)   Resp (!) 22  Ht _0  (1.88 m)   Wt (!) 152.9 kg   SpO2 92%   BMI 43.27 kg/m   Physical Exam Vitals and nursing note reviewed.  Constitutional:      Appearance: He is well-developed.  HENT:     Head: Normocephalic and atraumatic.  Cardiovascular:     Rate and Rhythm: Normal rate.  Pulmonary:     Effort: Pulmonary effort is normal. No respiratory distress.  Abdominal:     General: There is no distension.  Musculoskeletal:        General: Normal range of motion.     Cervical back: Normal range of motion.     Left lower leg: Edema present.     Comments: Right bka  Skin:    General: Skin is warm and dry.  Neurological:     General: No focal deficit present.     Mental Status: He is alert.    ED Results / Procedures / Treatments   Labs (all labs ordered are listed, but only abnormal results are displayed) Labs Reviewed  COMPREHENSIVE METABOLIC PANEL - Abnormal; Notable for the following components:      Result Value   CO2 18 (*)    Glucose, Bld 122 (*)    BUN 67 (*)    Creatinine, Ser 3.31 (*)    Albumin 3.2 (*)    AST 11 (*)    Total Bilirubin 1.3 (*)    GFR, Estimated 23 (*)    All other components within normal limits  CBC WITH DIFFERENTIAL/PLATELET - Abnormal; Notable for the following components:   WBC 11.5 (*)    RBC 3.00 (*)    Hemoglobin 8.7 (*)    HCT 27.8 (*)    Neutro Abs 9.4 (*)    Monocytes Absolute 1.2 (*)    All other components within normal limits  URINALYSIS, ROUTINE W REFLEX MICROSCOPIC - Abnormal; Notable for the following components:   APPearance HAZY (*)    Hgb urine dipstick SMALL (*)    Protein, ur >=300 (*)    Leukocytes,Ua LARGE (*)    Bacteria, UA MANY (*)    All other components within normal limits  BRAIN NATRIURETIC PEPTIDE - Abnormal; Notable for the following components:   B Natriuretic Peptide 606.3 (*)    All other components within normal  limits  CBG MONITORING, ED - Abnormal; Notable for the following components:   Glucose-Capillary 128 (*)    All other components within normal limits  RESP PANEL BY RT-PCR (FLU A&B, COVID) [id number]  LIPASE, BLOOD    EKG None  Radiology CT ABDOMEN PELVIS WO CONTRAST  Result Date: 08/20/2020 CLINICAL DATA:  Abdominal pain EXAM: CT ABDOMEN AND PELVIS WITHOUT CONTRAST TECHNIQUE: Multidetector CT imaging of the abdomen and pelvis was performed following the standard protocol without IV contrast. COMPARISON:  CT 11/30/2019 FINDINGS: Lower chest: Bilateral effusions. Hazy ground-glass opacities present in the lung bases in a configuration suggestive of pulmonary edema with some adjacent atelectatic changes. Cardiomegaly. Pericardial effusion. Coronary artery calcifications. Hepatobiliary: No visible focal liver lesion. Smooth liver surface contour. Normal hepatic attenuation. Gradient attenuation layering within the gallbladder likely reflecting a mixture of biliary stones, sludge. No pericholecystic fluid or inflammation. No discernible intraductal gallstone or biliary ductal dilatation. Pancreas: Mild pancreatic atrophy. No pancreatic ductal dilatation or surrounding inflammatory changes. Spleen: Normal in size. No concerning splenic lesions. Adrenals/Urinary Tract: Normal adrenal glands. Mild bilateral perinephric stranding. No visible or contour deforming renal lesion. No urolithiasis or hydronephrosis. Mild bladder  distension without significant wall thickening, calculi or debris. Stomach/Bowel: Distal esophagus, stomach and duodenal sweep are unremarkable. No small bowel wall thickening or dilatation. No evidence of obstruction. A normal appendix is visualized. Moderate colonic stool burden. No colonic dilatation or wall thickening. Vascular/Lymphatic: Atherosclerotic calcifications within the abdominal aorta and branch vessels. No aneurysm or ectasia. No enlarged abdominopelvic lymph nodes. Reproductive:  The prostate and seminal vesicles are unremarkable. Other: No abdominopelvic free fluid or free gas. No bowel containing hernias. Musculoskeletal: No acute osseous abnormality or suspicious osseous lesion. Multilevel degenerative changes are present in the imaged portions of the spine. Additional degenerative changes in the hips and pelvis. IMPRESSION: Hazy perihilar predominant ground-glass opacities in the lung bases suggestive of pulmonary edema with additional features CHF/volume overload with anasarca including body wall edema, pericardial and pleural effusions with cardiomegaly. Gradient attenuation and layering gallstones within the gallbladder. No CT evidence of acute cholecystitis or biliary ductal dilatation. Chronic pancreatic atrophy without acute abnormality. Moderate colonic stool burden, correlate for features of slow transit/constipation. Electronically Signed   By: Lovena Le M.D.   On: 08/20/2020 03:34   DG Chest 1 View  Result Date: 08/19/2020 CLINICAL DATA:  Shortness of breath EXAM: CHEST  1 VIEW COMPARISON:  11/28/2019 FINDINGS: Low lung volumes. Cardiomegaly with vascular congestion and diffuse bilateral ground-glass opacity suspected to represent pulmonary edema. No pleural effusion or pneumothorax IMPRESSION: Hypoventilatory change. Cardiomegaly with vascular congestion and diffuse bilateral ground-glass opacity suspected to represent edema Electronically Signed   By: Donavan Foil M.D.   On: 08/19/2020 19:36    Procedures Procedures   Medications Ordered in ED Medications  promethazine (PHENERGAN) 12.5 mg in sodium chloride 0.9 % 50 mL IVPB (0 mg Intravenous Stopped 08/20/20 0241)  furosemide (LASIX) injection 80 mg (80 mg Intravenous Given 08/20/20 0152)    ED Course  I have reviewed the triage vital signs and the nursing notes.  Pertinent labs & imaging results that were available during my care of the patient were reviewed by me and considered in my medical decision  making (see chart for details).    MDM Rules/Calculators/A&P                          Persistent dyspnea and orthopnea without much UOP with Lasix. No urinary symptoms otherwise, culture added on. Discussed with IM for observation.   Final Clinical Impression(s) / ED Diagnoses Final diagnoses:  Acute pulmonary edema [facility])    Rx / DC Orders ED Discharge Orders     None        Braylea Brancato, Corene Cornea, MD 08/21/20 [phone]

## 2020-08-21 DIAGNOSIS — Z86718 Personal history of other venous thrombosis and embolism: Secondary | ICD-10-CM | POA: Diagnosis not present

## 2020-08-21 DIAGNOSIS — J810 Acute pulmonary edema: Secondary | ICD-10-CM | POA: Diagnosis not present

## 2020-08-21 DIAGNOSIS — Z8249 Family history of ischemic heart disease and other diseases of the circulatory system: Secondary | ICD-10-CM | POA: Diagnosis not present

## 2020-08-21 DIAGNOSIS — K8020 Calculus of gallbladder without cholecystitis without obstruction: Secondary | ICD-10-CM | POA: Diagnosis present

## 2020-08-21 DIAGNOSIS — N390 Urinary tract infection, site not specified: Secondary | ICD-10-CM | POA: Diagnosis present

## 2020-08-21 DIAGNOSIS — Z86010 Personal history of colonic polyps: Secondary | ICD-10-CM | POA: Diagnosis not present

## 2020-08-21 DIAGNOSIS — Z89511 Acquired absence of right leg below knee: Secondary | ICD-10-CM | POA: Diagnosis not present

## 2020-08-21 DIAGNOSIS — E872 Acidosis: Secondary | ICD-10-CM | POA: Diagnosis present

## 2020-08-21 DIAGNOSIS — I5033 Acute on chronic diastolic (congestive) heart failure: Secondary | ICD-10-CM | POA: Diagnosis present

## 2020-08-21 DIAGNOSIS — E1122 Type 2 diabetes mellitus with diabetic chronic kidney disease: Secondary | ICD-10-CM | POA: Diagnosis present

## 2020-08-21 DIAGNOSIS — K8689 Other specified diseases of pancreas: Secondary | ICD-10-CM | POA: Diagnosis present

## 2020-08-21 DIAGNOSIS — F32A Depression, unspecified: Secondary | ICD-10-CM | POA: Diagnosis present

## 2020-08-21 DIAGNOSIS — K219 Gastro-esophageal reflux disease without esophagitis: Secondary | ICD-10-CM | POA: Diagnosis present

## 2020-08-21 DIAGNOSIS — Z7901 Long term (current) use of anticoagulants: Secondary | ICD-10-CM | POA: Diagnosis not present

## 2020-08-21 DIAGNOSIS — Z89611 Acquired absence of right leg above knee: Secondary | ICD-10-CM | POA: Diagnosis not present

## 2020-08-21 DIAGNOSIS — F419 Anxiety disorder, unspecified: Secondary | ICD-10-CM | POA: Diagnosis present

## 2020-08-21 DIAGNOSIS — D638 Anemia in other chronic diseases classified elsewhere: Secondary | ICD-10-CM | POA: Diagnosis present

## 2020-08-21 DIAGNOSIS — Z20822 Contact with and (suspected) exposure to covid-19: Secondary | ICD-10-CM | POA: Diagnosis present

## 2020-08-21 DIAGNOSIS — G4733 Obstructive sleep apnea (adult) (pediatric): Secondary | ICD-10-CM | POA: Diagnosis present

## 2020-08-21 DIAGNOSIS — I130 Hypertensive heart and chronic kidney disease with heart failure and stage 1 through stage 4 chronic kidney disease, or unspecified chronic kidney disease: Secondary | ICD-10-CM | POA: Diagnosis present

## 2020-08-21 DIAGNOSIS — Z86711 Personal history of pulmonary embolism: Secondary | ICD-10-CM | POA: Diagnosis not present

## 2020-08-21 DIAGNOSIS — B961 Klebsiella pneumoniae [K. pneumoniae] as the cause of diseases classified elsewhere: Secondary | ICD-10-CM | POA: Diagnosis present

## 2020-08-21 DIAGNOSIS — N184 Chronic kidney disease, stage 4 (severe): Secondary | ICD-10-CM | POA: Diagnosis present

## 2020-08-21 DIAGNOSIS — Z6841 Body Mass Index (BMI) 40.0 and over, adult: Secondary | ICD-10-CM | POA: Diagnosis not present

## 2020-08-21 LAB — CBC WITH DIFFERENTIAL/PLATELET
Abs Immature Granulocytes: 0.02 10*3/uL (ref 0.00–0.07)
Basophils Absolute: 0.1 10*3/uL (ref 0.0–0.1)
Basophils Relative: 1 %
Eosinophils Absolute: 0.3 10*3/uL (ref 0.0–0.5)
Eosinophils Relative: 3 %
HCT: 23.7 % — ABNORMAL LOW (ref 39.0–52.0)
Hemoglobin: 7.3 g/dL — ABNORMAL LOW (ref 13.0–17.0)
Immature Granulocytes: 0 %
Lymphocytes Relative: 18 %
Lymphs Abs: 1.3 10*3/uL (ref 0.7–4.0)
MCH: 28.1 pg (ref 26.0–34.0)
MCHC: 30.8 g/dL (ref 30.0–36.0)
MCV: 91.2 fL (ref 80.0–100.0)
Monocytes Absolute: 0.8 10*3/uL (ref 0.1–1.0)
Monocytes Relative: 11 %
Neutro Abs: 4.8 10*3/uL (ref 1.7–7.7)
Neutrophils Relative %: 67 %
Platelets: 281 10*3/uL (ref 150–400)
RBC: 2.6 MIL/uL — ABNORMAL LOW (ref 4.22–5.81)
RDW: 14.7 % (ref 11.5–15.5)
WBC: 7.3 10*3/uL (ref 4.0–10.5)
nRBC: 0 % (ref 0.0–0.2)

## 2020-08-21 LAB — BASIC METABOLIC PANEL
Anion gap: 8 (ref 5–15)
BUN: 71 mg/dL — ABNORMAL HIGH (ref 6–20)
CO2: 21 mmol/L — ABNORMAL LOW (ref 22–32)
Calcium: 8.9 mg/dL (ref 8.9–10.3)
Chloride: 107 mmol/L (ref 98–111)
Creatinine, Ser: 3.61 mg/dL — ABNORMAL HIGH (ref 0.61–1.24)
GFR, Estimated: 21 mL/min — ABNORMAL LOW (ref >60)
Glucose, Bld: 119 mg/dL — ABNORMAL HIGH (ref 70–99)
Potassium: 4.4 mmol/L (ref 3.5–5.1)
Sodium: 136 mmol/L (ref 135–145)

## 2020-08-21 LAB — GLUCOSE, CAPILLARY
Glucose-Capillary: 162 mg/dL — ABNORMAL HIGH (ref 70–99)
Glucose-Capillary: 104 mg/dL — ABNORMAL HIGH (ref 70–99)
Glucose-Capillary: 160 mg/dL — ABNORMAL HIGH (ref 70–99)
Glucose-Capillary: 196 mg/dL — ABNORMAL HIGH (ref 70–99)
Glucose-Capillary: 268 mg/dL — ABNORMAL HIGH (ref 70–99)

## 2020-08-21 LAB — MAGNESIUM: Magnesium: 2.2 mg/dL (ref 1.7–2.4)

## 2020-08-21 NOTE — Progress Notes (Signed)
Pt called my attention and he was concern about feeling dizzy when he lay down on bed. Upon assessment VS was stable. BP (!) 109/54   Pulse 79   Temp 98.6 F (37 C) (Oral)   Resp 17   Ht 6\' 2"  (1.88 m)   Wt (!) 152.9 kg   SpO2 90%   BMI 43.27 kg/m - taken while laying on bed. VS when sitting was BP 113/60.spo2 90%. CBG checked was 162.   Noted that pt SPO2 sustaining at 88%-90%. Pt has hx of Sleep apnea but pt claims not using CPAP. Hooked Pt on O2 at 1L for now while laying.  Pt felt better.

## 2020-08-21 NOTE — Progress Notes (Signed)
Inpatient Diabetes Program Recommendations  AACE/ADA: New Consensus Statement on Inpatient Glycemic Control (2015)  Target Ranges:  Prepandial:   less than 140 mg/dL      Peak postprandial:   less than 180 mg/dL (1-2 hours)      Critically ill patients:  140 - 180 mg/dL   Lab Results  Component Value Date   GLUCAP 104 (H) 08/21/2020   HGBA1C 5.5 07/08/2020    Review of Glycemic Control  Diabetes history: DM2 Outpatient Diabetes medications: Tresiba 45 units QD, Novolog 05-29-23 TID, Victoza 1.2 mg QD. Current orders for Inpatient glycemic control: Semglee 25 QHS, Novolog 0-15 TID  HgbA1C 5.5% CO2 21  Inpatient Diabetes Program Recommendations:    Will likely need meal coverage when eating > 50% meals Novolog 03-18-08 units TID May keep [place] on, although CBGs will be checked with hospital meter per [place].  Will follow glucose trends.  Thank you. Lorenda Peck, RD, LDN, CDE Inpatient Diabetes Coordinator [phone]

## 2020-08-21 NOTE — Progress Notes (Signed)
Subjective:   [name] reports feeling better today. Felt a little lightheaded earlier this morning but this resolved. Otherwise, he reports improvement in breathing with diuresis. States his nausea has improved and he has not had any further episodes of vomiting. States that his abdominal pain is still present, but is improved from yesterday.  States that his weight has been fluctuating but when he last checked this past Monday it was around 335 lbs and he did not note any significant leg swelling at that time.  Objective:  Vital signs in last 24 hours: Vitals:   08/21/20 0012 08/21/20 0137 08/21/20 0244 08/21/20 0453  BP: 123/60 (!) 109/54  126/68  Pulse: 80 79  74  Resp: 17   17  Temp: 98.6 F (37 C)   98.8 F (37.1 C)  TempSrc: Oral   Oral  SpO2: 90% 90%  96%  Weight:   (!) 158.5 kg   Height:       Physical Exam: General: Morbidly obese male, sitting up in bed, NAD. CV: normal rate and regular rhythm, no m/r/g. + JVD.  Pulm: Difficult to appreciate lung sounds due to body habitus but no overtly adventitious sounds noted. Normal work of breathing. Abdomen: Soft, nondistended, mild generalized tenderness. Bowel sounds present. No rebound or guarding noted. MSK: 1+ pitting edema up to left knee. Right above knee amputation, no edema noted on residual right limb.  Neuro: AAOx4, no focal deficits noted.  Assessment/Plan:  Active Problems:   Acute on chronic heart failure [facility])  [name] is a 45 year old gentleman with history of HFpEF with EF of 60 to 65%, type 2 diabetes on long-term insulin, CKD stage IV, gastroparesis, DVT/PE on chronic anticoagulation, hypertension, OSA currently admitted for acute on chronic HF exacerbation complicated by emesis and concern for pyelonephritis.  Acute on chronic HFpEF Echo in May 2021 with EF 60 to 65% and no regional wall motion abnormalities.  Has had multiple hospitalizations for acute on chronic heart failure requiring IV Lasix.   His volume status does appear to be improved from yesterday, but he is still significantly volume overloaded with JVD and 1+ pitting edema remaining.  He had about 2.5 L of urine output since yesterday.  States that he was about 335 pounds this past Monday when he last checked his weight.  Unsure if this is his actual dry weight.  Anticipate his creatinine will improve with diuresis. -Telemetry -IV Lasix 80 mg twice daily -Maintain K greater than 4.0 and Mg greater than 2.0 -Strict I/O's, daily weights  Diffuse abdominal pain - improving Emesis - resolved States that his abdominal pain has been improving and he was able to tolerate some p.o. intake.  He is no longer having any emesis and his intermittent nausea is well controlled with Zofran.  Abdomen pelvis showed chronic biliary sludge. Right upper quadrant ultrasound showing cholelithiasis and biliary sludge but no evidence of cholecystitis or bile duct obstruction. -Continue Zofran  Klebsiella pneumonia bacteriuria Urine cultures positive for K. Pneumoniae since August 2020 with multiple rounds of antibiotics. Patient previously on suppressive Trimethoprim but no longer taking due to CKD. CT A/P on admission with bilateral peri-nephritic stranding that is new concerning for pyelonephritis, presumably from K. Pneumoniae.  -Continue Rocephin -Urine culture sensitivities pending to evaluate resistance pattern  CKD stage IV Follows Dr. Joelyn Oms at [facility].  Slight worsening of renal function with aggressive diuresis but anticipate improvement with offloading of volume. -Trend BMP  Hypertension -Continue home hydralazine 50  mg 3 times daily, Coreg 25 mg twice daily, and Norvasc 10 mg daily  Type 2 diabetes, insulin-dependent Follows with endocrinology, Dr. Dwyane Dee.  Last A1c 5.5% 1 month ago.  Currently on Victoza and Tresiba. -Moderate SSI -Lantus 25 units at bedtime  Prior to Admission Living Arrangement: Home, living  alone Anticipated Discharge Location: Home Barriers to Discharge: continued medical management Dispo: Anticipated discharge in approximately 1-2 day(s).   Virl Axe, MD 08/21/2020, 10:49 AM Pager: [phone] After 5pm on weekdays and 1pm on weekends: On Call pager [phone]

## 2020-08-22 LAB — CBC WITH DIFFERENTIAL/PLATELET
Abs Immature Granulocytes: 0.04 10*3/uL (ref 0.00–0.07)
Basophils Absolute: 0.1 10*3/uL (ref 0.0–0.1)
Basophils Relative: 1 %
Eosinophils Absolute: 0.3 10*3/uL (ref 0.0–0.5)
Eosinophils Relative: 4 %
HCT: 24.7 % — ABNORMAL LOW (ref 39.0–52.0)
Hemoglobin: 7.6 g/dL — ABNORMAL LOW (ref 13.0–17.0)
Immature Granulocytes: 1 %
Lymphocytes Relative: 14 %
Lymphs Abs: 1.1 10*3/uL (ref 0.7–4.0)
MCH: 28.4 pg (ref 26.0–34.0)
MCHC: 30.8 g/dL (ref 30.0–36.0)
MCV: 92.2 fL (ref 80.0–100.0)
Monocytes Absolute: 0.8 10*3/uL (ref 0.1–1.0)
Monocytes Relative: 11 %
Neutro Abs: 5.4 10*3/uL (ref 1.7–7.7)
Neutrophils Relative %: 69 %
Platelets: 284 10*3/uL (ref 150–400)
RBC: 2.68 MIL/uL — ABNORMAL LOW (ref 4.22–5.81)
RDW: 14.6 % (ref 11.5–15.5)
WBC: 7.7 10*3/uL (ref 4.0–10.5)
nRBC: 0 % (ref 0.0–0.2)

## 2020-08-22 LAB — GLUCOSE, CAPILLARY
Glucose-Capillary: 160 mg/dL — ABNORMAL HIGH (ref 70–99)
Glucose-Capillary: 214 mg/dL — ABNORMAL HIGH (ref 70–99)
Glucose-Capillary: 176 mg/dL — ABNORMAL HIGH (ref 70–99)
Glucose-Capillary: 199 mg/dL — ABNORMAL HIGH (ref 70–99)

## 2020-08-22 LAB — BASIC METABOLIC PANEL
Anion gap: 7 (ref 5–15)
BUN: 78 mg/dL — ABNORMAL HIGH (ref 6–20)
CO2: 20 mmol/L — ABNORMAL LOW (ref 22–32)
Calcium: 8.6 mg/dL — ABNORMAL LOW (ref 8.9–10.3)
Chloride: 108 mmol/L (ref 98–111)
Creatinine, Ser: 3.86 mg/dL — ABNORMAL HIGH (ref 0.61–1.24)
GFR, Estimated: 19 mL/min — ABNORMAL LOW (ref >60)
Glucose, Bld: 203 mg/dL — ABNORMAL HIGH (ref 70–99)
Potassium: 4.4 mmol/L (ref 3.5–5.1)
Sodium: 135 mmol/L (ref 135–145)

## 2020-08-22 LAB — URINE CULTURE: Culture: >=100000 — AB

## 2020-08-22 LAB — MAGNESIUM: Magnesium: 2.2 mg/dL (ref 1.7–2.4)

## 2020-08-22 MED ORDER — INSULIN ASPART 100 UNIT/ML IJ SOLN
0.0000 [IU] | Freq: Three times a day (TID) | INTRAMUSCULAR | Status: DC
  Administered 2020-08-22: 7 [IU] via SUBCUTANEOUS
  Administered 2020-08-22 – 2020-08-23 (×3): 4 [IU] via SUBCUTANEOUS

## 2020-08-22 MED ORDER — FUROSEMIDE 10 MG/ML IJ SOLN
80.0000 mg | Freq: Every day | INTRAMUSCULAR | Status: DC
  Administered 2020-08-23: 80 mg via INTRAVENOUS
  Filled 2020-08-22: qty 8

## 2020-08-22 NOTE — Progress Notes (Signed)
Subjective:   Mr. Roy reports that we was unable to sleep well last night.   States that he thinks his left leg is still swollen compared to normal, but notes great improvement compared to when he came in. He is willing to try knee high compression stockings to help with diuresis.  States that he feels his breathing is improved. His dizziness with lying down has improved.   He is not hungry at the moment, but has breakfast at bedside for when he would like to eat.   Mr. Gaby would like for [place] to reach out to Dr. Joelyn Oms to update him about his kidney function while here, which we will do tomorrow as it is currently the weekend.   Objective:  Vital signs in last 24 hours: Vitals:   08/21/20 1924 08/22/20 0331 08/22/20 0334 08/22/20 0840  BP: 125/64 (!) 143/70  (!) 154/77  Pulse: 80 80  80  Resp: 20 19  20   Temp: 98.9 F (37.2 C) 98.9 F (37.2 C)  98.9 F (37.2 C)  TempSrc: Oral Oral  Oral  SpO2: 99% 98%  93%  Weight:   (!) 159 kg   Height:       Physical Exam: General: Morbidly obese male, sitting up in bed, NAD. CV: normal rate and regular rhythm, no m/r/g. Difficult to appreciate JVD d/t habitus. Pulm: Difficult to appreciate lung sounds but no overtly adventitious sounds noted on exam. Normal work of breathing. MSK: 1+ pitting edema up to mid-calf in left LE. Right above knee amputation, no edema note don residual right limb. No sacral edema noted. Neuro: AAOx4, no focal deficits noted.  Assessment/Plan:  Active Problems:   Acute on chronic heart failure [facility])  Mr. Jarmon is a 45 year old gentleman with history of HFpEF with EF of 60 to 65%, type 2 diabetes on long-term insulin, CKD stage IV, gastroparesis, DVT/PE on chronic anticoagulation, hypertension, OSA currently admitted for acute on chronic HF exacerbation complicated by emesis and concern for pyelonephritis.  Acute on chronic HFpEF Echo in May 2021 with EF 60 to 65% and no regional wall motion  abnormalities.  Has had multiple hospitalizations for acute on chronic heart failure requiring IV Lasix.  Volume status appears improved from yesterday, but still will require additional diuresis. 2.4L UOP since yesterday. Will change IV lasix to 80mg  daily today and also add knee high compression stockings to assist with diuresis. -telemetry d/c'd -IV lasix 80mg  daily -Maintain K >4.0 and Mg >2.0 -strict I/Os, daily weights  Diffuse abdominal pain - improving Emesis - resolved Abdominal pain has improved, is tolerating PO intake. No longer nauseous or having any episodes of emesis. Abdomen pelvis showed chronic biliary sludge. Right upper quadrant ultrasound showing cholelithiasis and biliary sludge but no evidence of cholecystitis or bile duct obstruction. -Continue Zofran prn  Normocytic Anemia Baseline Hgb appears to be around 9.5. Since admission, Hgb 8.7 > 7.3 > 7.6. Currently hemodynamically stable. MCV 92.2, likely anemia of chronic disease from advanced kidney disease. Given presentation with abdominal pain, will also need to keep gastritis vs PUD on differential although no complaints of melena or acid reflux at this time. -trend CBC -transfuse if Hgb <7.0 and/or hemodynamic instability  Klebsiella pneumonia bacteriuria Urine cultures positive for K. Pneumoniae since August 2020 with multiple rounds of antibiotics. Patient previously on suppressive Trimethoprim but no longer taking due to CKD. CT A/P on admission with bilateral peri-nephritic stranding that is new concerning for pyelonephritis, presumably from K. Pneumoniae. Urine  culture sensitivities showing resistance only to ampicillin and macrobid, along with intermediate sensitivity to Unasyn. -Continue Rocephin (day 3)  CKD stage IV Follows Dr. Joelyn Oms at [facility]. Renal function worsening with diuresis. Will decrease lasix dose to 80mg  daily and add compression stockings. Hopeful that kidney function will improve once  volume status is optimized. Will reach out to Dr. Joelyn Oms tomorrow per patient request. -Trend BMP -Discuss with Dr. Joelyn Oms tomorrow  Hypertension -Continue home hydralazine 50 mg 3 times daily, Coreg 25 mg twice daily, and Norvasc 10 mg daily  Type 2 diabetes, insulin-dependent Follows with endocrinology, Dr. Dwyane Dee.  Last A1c 5.5% 1 month ago.  Currently on Victoza and Tresiba. -Changed to Resistant SSI for more optimal coverage -Semglee 25 units at bedtime  Prior to Admission Living Arrangement: Home, living alone Anticipated Discharge Location: Home Barriers to Discharge: continued medical management Dispo: Anticipated discharge in approximately 1-2 day(s).   Virl Axe, MD 08/22/2020, 1:48 PM Pager: [phone] After 5pm on weekdays and 1pm on weekends: On Call pager [phone]

## 2020-08-23 ENCOUNTER — Other Ambulatory Visit: Payer: Self-pay | Admitting: Endocrinology

## 2020-08-23 LAB — CBC WITH DIFFERENTIAL/PLATELET
Abs Immature Granulocytes: 0.03 10*3/uL (ref 0.00–0.07)
Basophils Absolute: 0.1 10*3/uL (ref 0.0–0.1)
Basophils Relative: 1 %
Eosinophils Absolute: 0.4 10*3/uL (ref 0.0–0.5)
Eosinophils Relative: 4 %
HCT: 26 % — ABNORMAL LOW (ref 39.0–52.0)
Hemoglobin: 7.9 g/dL — ABNORMAL LOW (ref 13.0–17.0)
Immature Granulocytes: 0 %
Lymphocytes Relative: 10 %
Lymphs Abs: 1 10*3/uL (ref 0.7–4.0)
MCH: 28.1 pg (ref 26.0–34.0)
MCHC: 30.4 g/dL (ref 30.0–36.0)
MCV: 92.5 fL (ref 80.0–100.0)
Monocytes Absolute: 0.9 10*3/uL (ref 0.1–1.0)
Monocytes Relative: 9 %
Neutro Abs: 7.4 10*3/uL (ref 1.7–7.7)
Neutrophils Relative %: 76 %
Platelets: 282 10*3/uL (ref 150–400)
RBC: 2.81 MIL/uL — ABNORMAL LOW (ref 4.22–5.81)
RDW: 14.8 % (ref 11.5–15.5)
WBC: 9.7 10*3/uL (ref 4.0–10.5)
nRBC: 0 % (ref 0.0–0.2)

## 2020-08-23 LAB — RENAL FUNCTION PANEL
Albumin: 2.8 g/dL — ABNORMAL LOW (ref 3.5–5.0)
Anion gap: 7 (ref 5–15)
BUN: 75 mg/dL — ABNORMAL HIGH (ref 6–20)
CO2: 20 mmol/L — ABNORMAL LOW (ref 22–32)
Calcium: 8.7 mg/dL — ABNORMAL LOW (ref 8.9–10.3)
Chloride: 109 mmol/L (ref 98–111)
Creatinine, Ser: 3.37 mg/dL — ABNORMAL HIGH (ref 0.61–1.24)
GFR, Estimated: 22 mL/min — ABNORMAL LOW (ref >60)
Glucose, Bld: 215 mg/dL — ABNORMAL HIGH (ref 70–99)
Phosphorus: 4 mg/dL (ref 2.5–4.6)
Potassium: 4.4 mmol/L (ref 3.5–5.1)
Sodium: 136 mmol/L (ref 135–145)

## 2020-08-23 LAB — GLUCOSE, CAPILLARY
Glucose-Capillary: 190 mg/dL — ABNORMAL HIGH (ref 70–99)
Glucose-Capillary: 154 mg/dL — ABNORMAL HIGH (ref 70–99)

## 2020-08-23 MED ORDER — OXYBUTYNIN CHLORIDE 5 MG PO TABS: 5.0000 mg | ORAL_TABLET | Freq: Three times a day (TID) | ORAL | 0 refills | Status: DC

## 2020-08-23 MED ORDER — GUAIFENESIN-DM 100-10 MG/5ML PO SYRP
5.0000 mL | ORAL_SOLUTION | ORAL | Status: DC | PRN
  Administered 2020-08-23: 5 mL via ORAL
  Filled 2020-08-23: qty 5

## 2020-08-23 NOTE — Discharge Summary (Addendum)
Name: [name] MRN: [id number] DOB: [birth year] 45 y.o. PCP: [name], MD  Date of Admission: 08/19/2020  6:11 PM Date of Discharge: 08/23/2020 Attending Physician: Angelica Pou, MD  Discharge Diagnosis: 1. Acute on Chronic HFpEF 2. Abdominal Pain 3. Emesis 4. Urinary Tract Infection 5. Chronic Kidney Disease Stage IV 6. Hypertension 7.Type-2-Diabetes Mellitus   Discharge Medications: Allergies as of 08/23/2020       Reactions   Reglan [metoclopramide] Other (See Comments)   Dysphoric reaction   Metformin And Related Diarrhea   Ozempic (0.25 Or 0.5 Mg-dose) [semaglutide(0.25 Or 0.33m-dos)] Nausea And Vomiting        Medication List     TAKE these medications    Accu-Chek Guide Me w/Device Kit Use to check blood sugar up to 3 times a day What changed:  how much to take how to take this when to take this additional instructions   Accu-Chek Guide test strip Generic drug: glucose blood Check blood sugar up to 3 times a day What changed:  how much to take how to take this when to take this additional instructions   accu-chek softclix lancets Use to check blood sugars 5 times a day. Dx code:E11.9. Insulin dependent. What changed:  how much to take how to take this when to take this   Accu-Chek Softclix Lancets lancets Check blood sugar up to 3 times a day What changed:  how much to take how to take this when to take this additional instructions   acidophilus Caps capsule Take 1 capsule by mouth daily.   Alpha-Lipoic Acid 600 MG Caps Take 600 mg by mouth daily.   amLODipine 10 MG tablet Commonly known as: NORVASC Take 10 mg by mouth at bedtime.   B-12 500 MCG Subl Place 500 mcg under the tongue daily.   B-D UF III MINI PEN NEEDLES 31G X 5 MM Misc Generic drug: Insulin Pen Needle 1 each by Other route See admin instructions. USE 4-5 TIMES DAILY   Pen Needles 3/16" 31G X 5 MM Misc Use five times daily   carvedilol 25 MG  tablet Commonly known as: COREG Take 1 tablet (25 mg total) by mouth 2 (two) times daily with a meal.   Eliquis 5 MG Tabs tablet Generic drug: apixaban Take 1 tablet by mouth twice daily What changed: how much to take   famotidine 20 MG tablet Commonly known as: PEPCID Take 1 tablet (20 mg total) by mouth 2 (two) times daily. What changed:  when to take this reasons to take this   fluticasone 50 MCG/ACT nasal spray Commonly known as: Flonase Place 2 sprays into both nostrils daily. What changed:  when to take this reasons to take this   furosemide 80 MG tablet Commonly known as: LASIX Take 1 tablet by mouth twice daily What changed:  how much to take when to take this additional instructions   gabapentin 100 MG capsule Commonly known as: NEURONTIN TAKE 1 CAPSULE BY MOUTH THREE TIMES DAILY   hydrALAZINE 50 MG tablet Commonly known as: APRESOLINE TAKE 1 TABLET BY MOUTH THREE TIMES DAILY   insulin aspart 100 UNIT/ML FlexPen Commonly known as: NOVOLOG Administer 5 units in the morning with breakfast, 20 units with lunch, and 25 units with dinner daily.  ICD Code:  E11.9   lovastatin 20 MG tablet Commonly known as: MEVACOR Take 1 tablet by mouth once daily   oxybutynin 5 MG tablet Commonly known as: DITROPAN Take 1 tablet (5 mg total) by  mouth 3 (three) times daily.   potassium chloride SA 20 MEQ tablet Commonly known as: KLOR-CON Take 20 mEq by mouth 2 (two) times daily.   sildenafil 50 MG tablet Commonly known as: VIAGRA Take 50 mg by mouth as needed for erectile dysfunction.   tamsulosin 0.4 MG Caps capsule Commonly known as: FLOMAX Take 0.4 mg by mouth at bedtime.   Tyler Aas FlexTouch 200 UNIT/ML FlexTouch Pen Generic drug: insulin degludec INJECT 45 UNITS SUBCUTANEOUSLY ONCE DAILY What changed:  how much to take how to take this when to take this additional instructions   Victoza 18 MG/3ML Sopn Generic drug: liraglutide INJECT 1.2 MG INTO THE  SKIN ONCE DAILY AT THE SAME TIME. What changed: See the new instructions.       ASK your doctor about these medications    ondansetron 4 MG tablet Commonly known as: ZOFRAN Take 1 tablet (4 mg total) by mouth every 6 (six) hours as needed for nausea.   polyethylene glycol 17 g packet Commonly known as: MIRALAX / GLYCOLAX Take 17 g by mouth 2 (two) times daily.   senna-docusate 8.6-50 MG tablet Commonly known as: Senokot-S Take 1 tablet by mouth daily.        Disposition and follow-up:   [name] was discharged from [facility] in Stable condition.  At the hospital follow up visit please address:  1.  Acute on Chronic HFpEF: Patient presented with SOB and was diuresed. He had good urine output and felt better before discharge. 2. Abdominal pain/emesis: Patient had abdominal pain and emesis that resolved with Zofran. He reported multiple previous steps and cyclical vomiting syndrome should be ruled out in outpatient setting. 3. Normocytic Anemia: Patient had anemia noted while in inpatient with hgb as low as 7.3. Follow to make sure it is okay. 4. UTI: Patient had UTI with klebsiella p. He was given Rocephin for 3 days. It appears he is a chronic colonizer. There was a suspicion for Pyelonephritis based on CT scan but no symptoms endorsed by the patient. Follow up to make sure he is asymptomatic. 5. CKD IV: Patient has CKD stage IV and suffered an acute injury secondary to the CKD. The AKI was resolved with diuresis. Continue to monitor for stability of CKD or another AKI.  6. Hypertension: Patient has htn well controlled with Hydralazine 50 mg TID, Coreg 25 mg BID, Norvasc 10 mg daily. Continue to monitor. 7. Type 2 Diabetes Mellitus: Patient has diabetes and is on insulin. He has sickle cell so his A1c is not accurate. Continue on home insulin and make adjustments as needed.   2.  Labs / imaging needed at time of follow-up: CBC, BMP  3.  Pending labs/  test needing follow-up: None  Follow-up Appointments: Nephrology will call with follow up. Internal Medicine Center will call with follow up PCP visit.  Hospital Course by Date: 08/19/20-08/20/20-Patient presented to the ED with SOB, nausea, vomiting. This started on August 9th and has been worsening. It got worse on 08/11 and he came to the ED. On arrival to the ED, patient was noted to be hypertensive at 164/84 with heart rate of 90. He was saturating at 95% on room air with respiratory rate of 20 that increased to 22. He is afebrile at 99.6. Initial lab work demonstrated a leukocytosis of 11.5 with hemoglobin of 8.7. CMP showed a non-AG acidosis but otherwise stable compared to prior. BNP elevated at 606. CXR demonstrated vascular congestion with GGO consistent with  edema. CT A/P remarkable for GGO in bilateral bases, cholelithiasis, chronic pancreatic atrophy and moderate colonic stool burden. IMTS was consulted for admission due to increased work of breathing not resolved with Lasix. He was admitted to the IM service. IV Rocephin started for UTI. 08/21/20- No acute events overnight. Patient continued receiving IV lasix and noted improvement in his breathing. His vomiting and abdominal pain improved. 08/22/20-No acute events overnight but stated he had trouble sleeping overnight. Had complaint of leg swelling and agreed to wear TED hose. Patient creatinine was elevated.  08/23/20-No acute events overnight. Patient felt better and wanted to leave the hospital so he could move around and not have to lie in bed. His AKI resolved and creatinine went from 3.86 to 3.37. He was given IV lasix for residual swelling and with input from his nephrologist he was discharged. Nephrologist was agreeable to discharge with follow up at his office. Patient deemed stable for discharge. Instructions explained and and patient endorsed understanding. Patient was discharged.    Discharge Subjective: Patient seen at  bedside. He was alert and in good spirits. He wanted to know when he can go home and move around as he was tried of staying in bed. Did not endorse any acute complaints and when asked about his belly pain, he said it had resolved.    Discharge Exam:   BP 136/69   Pulse 74   Temp 98.5 F (36.9 C)   Resp 18   Ht 6' 2"  (1.88 m)   Wt (!) 159.2 kg   SpO2 95%   BMI 45.06 kg/m  Discharge exam:  Physical Exam Constitutional:      Appearance: He is well-developed.  HENT:     Head: Normocephalic and atraumatic.  Cardiovascular:     Rate and Rhythm: Normal rate and regular rhythm.  Pulmonary:     Effort: Pulmonary effort is normal.     Breath sounds: Normal breath sounds.  Abdominal:     General: Bowel sounds are normal. There is no distension.     Palpations: Abdomen is soft.  Musculoskeletal:        General: Swelling (1-2+ edema) and signs of injury (right AKA) present.  Skin:    Capillary Refill: Capillary refill takes less than 2 seconds.  Neurological:     General: No focal deficit present.     Mental Status: He is alert.  Psychiatric:        Mood and Affect: Mood normal.        Behavior: Behavior normal.     Pertinent Labs, Studies, and Procedures:  CBC Latest Ref Rng & Units 08/23/2020 08/22/2020 08/21/2020  WBC 4.0 - 10.5 K/uL 9.7 7.7 7.3  Hemoglobin 13.0 - 17.0 g/dL 7.9(L) 7.6(L) 7.3(L)  Hematocrit 39.0 - 52.0 % 26.0(L) 24.7(L) 23.7(L)  Platelets 150 - 400 K/uL 282 284 281    BMP Latest Ref Rng & Units 08/23/2020 08/22/2020 08/21/2020  Glucose 70 - 99 mg/dL 215(H) 203(H) 119(H)  BUN 6 - 20 mg/dL 75(H) 78(H) 71(H)  Creatinine 0.61 - 1.24 mg/dL 3.37(H) 3.86(H) 3.61(H)  BUN/Creat Ratio 9 - 20 - - -  Sodium 135 - 145 mmol/L 136 135 136  Potassium 3.5 - 5.1 mmol/L 4.4 4.4 4.4  Chloride 98 - 111 mmol/L 109 108 107  CO2 22 - 32 mmol/L 20(L) 20(L) 21(L)  Calcium 8.9 - 10.3 mg/dL 8.7(L) 8.6(L) 8.9    CT ABDOMEN PELVIS WO CONTRAST  Result Date: 08/20/2020 CLINICAL DATA:   Abdominal pain  EXAM: CT ABDOMEN AND PELVIS WITHOUT CONTRAST TECHNIQUE: Multidetector CT imaging of the abdomen and pelvis IMPRESSION: Hazy perihilar predominant ground-glass opacities in the lung bases suggestive of pulmonary edema with additional features CHF/volume overload with anasarca including body wall edema, pericardial and pleural effusions with cardiomegaly. Gradient attenuation and layering gallstones within the gallbladder. No CT evidence of acute cholecystitis or biliary ductal dilatation. Chronic pancreatic atrophy without acute abnormality. Moderate colonic stool burden, correlate for features of slow transit/constipation. Electronically Signed   By: Lovena Le M.D.   On: 08/20/2020 03:34   DG Chest 1 View  Result Date: 08/19/2020 CLINICAL DATA:  Shortness of breath EXAM: CHEST  1 VIEW COMPARISON:  11/28/2019 FINDINGS: Low lung volumes. Cardiomegaly with vascular congestion and diffuse bilateral ground-glass opacity suspected to represent pulmonary edema. No pleural effusion or pneumothorax IMPRESSION: Hypoventilatory change. Cardiomegaly with vascular congestion and diffuse bilateral ground-glass opacity suspected to represent edema Electronically Signed   By: Donavan Foil M.D.   On: 08/19/2020 19:36   US Abdomen Limited RUQ (LIVER/GB)  Result Date: 08/20/2020 CLINICAL DATA:  45 year old male with abdominal pain for 3 days. Gallstones on CT Abdomen and Pelvis earlier today. EXAM: ULTRASOUND ABDOMEN LIMITED RIGHT UPPER QUADRANT COMPARISON:  CT Abdomen and Pelvis 0303 hours. FINDINGS: Gallbladder: Small layering echogenic stones individually estimated at 8 mm. Gallbladder wall thickness remains normal at 2 mm. Superimposed sludge. No pericholecystic fluid. No sonographic Murphy sign elicited. Common bile duct: Diameter: 5 mm, normal. Liver: No focal lesion identified. Within normal limits in parenchymal echogenicity. Portal vein is patent on color Doppler imaging with normal direction of  blood flow towards the liver. Other: Negative visible right kidney. IMPRESSION: Cholelithiasis and gallbladder sludge but no evidence of acute cholecystitis or bile duct obstruction. Electronically Signed   By: Genevie Ann M.D.   On: 08/20/2020 11:59      Discharge Instructions: Discharge Instructions     (HEART FAILURE PATIENTS) Call MD:  Anytime you have any of the following symptoms: 1) 3 pound weight gain in 24 hours or 5 pounds in 1 week 2) shortness of breath, with or without a dry hacking cough 3) swelling in the hands, feet or stomach 4) if you have to sleep on extra pillows at night in order to breathe.   Complete by: As directed    Call MD for:  difficulty breathing, headache or visual disturbances   Complete by: As directed    Call MD for:  extreme fatigue   Complete by: As directed    Call MD for:  hives   Complete by: As directed    Call MD for:  persistant dizziness or light-headedness   Complete by: As directed    Call MD for:  persistant nausea and vomiting   Complete by: As directed    Call MD for:  redness, tenderness, or signs of infection (pain, swelling, redness, odor or green/yellow discharge around incision site)   Complete by: As directed    Call MD for:  severe uncontrolled pain   Complete by: As directed    Call MD for:  temperature >100.4   Complete by: As directed    Diet - low sodium heart healthy   Complete by: As directed    Discharge instructions   Complete by: As directed    [name], it was a pleasure taking care of you during your stay here. You came in with leg swelling from your heart failure and were given IV lasix to help take  fluid off of you.  Please take note of the following: 1. Please resume taking your home doses of lasix as prescribed.  2. I spoke to Dr. Joelyn Oms and he has reviewed your kidney function during your hospital course. He advised me to tell you to make sure you stick to a good diet, continue exercising as you have been, and he  will schedule a follow up visit with you in the kidney clinic.  3. I have refilled your oxybutynin per your request and sent it to the [facility] on [address].  4. I have scheduled you for a follow up appointment with your PCP, Dr. Gilford Rile, in about 2 weeks. Someone will call you to set up this appointment.   Increase activity slowly   Complete by: As directed        Signed: Idamae Schuller, MD 08/23/2020, 2:02 PM   Pager: [phone]

## 2020-08-23 NOTE — Progress Notes (Signed)
Inpatient Diabetes Program Recommendations  AACE/ADA: New Consensus Statement on Inpatient Glycemic Control   Target Ranges:  Prepandial:   less than 140 mg/dL      Peak postprandial:   less than 180 mg/dL (1-2 hours)      Critically ill patients:  140 - 180 mg/dL  Results for DENZELL, [name] (MRN [id number]) as of 08/23/2020 10:22  Ref. Range 08/22/2020 06:14 08/22/2020 12:14 08/22/2020 16:44 08/22/2020 21:13 08/23/2020 05:56  Glucose-Capillary Latest Ref Range: 70 - 99 mg/dL 160 (H) 214 (H) 176 (H) 199 (H) 190 (H)    Review of Glycemic Control  Diabetes history: DM2 Outpatient Diabetes medications: Tresiba 45 units daily, Novolog 5 units with breakfast, Novolog 20 units with lunch, Novolog 25 units with supper, Victoza 1.2 mg daily Current orders for Inpatient glycemic control: Semglee 25 units QHS, Novolog 0-20 units TID with meals  Inpatient Diabetes Program Recommendations:    Insulin: Please consider ordering Novolog 0-5 units QHS and ordering Novolog 3 units TID with meals for meal coverage if patient eats at least 50% of meals.  Thanks, Barnie Alderman, RN, MSN, CDE Diabetes Coordinator Inpatient Diabetes Program [phone] (Team Pager from 8am to 5pm)

## 2020-08-23 NOTE — TOC Transition Note (Signed)
Transition of Care [facility]) - CM/SW Discharge Note   Patient Details  Name: [name] MRN: [id number] Date of Birth: [birth year]  Transition of Care [facility]) CM/SW Contact:  Zenon Mayo, RN Phone Number: 08/23/2020, 2:52 PM   Clinical Narrative:    Patient is for dc to home today, he has no needs.   Final next level of care: Home/Self Care Barriers to Discharge: No Barriers Identified   Patient Goals and CMS Choice Patient states their goals for this hospitalization and ongoing recovery are:: return home   Choice offered to / list presented to : NA  Discharge Placement                       Discharge Plan and Services In-house Referral: NA Discharge Planning Services: CM Consult Post Acute Care Choice: NA            DME Agency: NA       HH Arranged: NA          Social Determinants of Health (SDOH) Interventions     Readmission Risk Interventions Readmission Risk Prevention Plan 08/23/2020 04/16/2018  Transportation Screening Complete Complete  PCP or Specialist Appt within 3-5 Days Complete Complete  HRI or [place] Complete Complete  Social Work Consult for [place] Planning/Counseling Complete Complete  Palliative Care Screening Not Applicable Not Applicable  Medication Review Press photographer) Complete Complete  Some recent data might be hidden

## 2020-08-23 NOTE — Consult Note (Addendum)
   [facility] CM Inpatient Consult   08/23/2020  [name] Feb 26, 1975 [id number]  [employer] [ACO] Patient: Medicare CMS DCE  Primary Care Provider:  [name], MD [facility] Internal Medicine   Patient is noted to be active with [facility] Care Management services with the [facility] Internal Medicine CCM team. Patient will have the transition of care call conducted by the primary care provider. This patient has been engaged with the CCM program/team.  Plan: Notification sent to the [place] Management has been sent for ongoing follow up.   Late entry 1550 08/23/20 Spoke with the patient getting ready for DC and reminded of CCM follow up  Please contact for further questions,  [name], RN BSN [facility] Liaison  [phone] business mobile phone Toll free office [phone]  Fax number: [phone] [email or web address]

## 2020-08-23 NOTE — TOC Initial Note (Signed)
Transition of Care [facility]) - Initial/Assessment Note    Patient Details  Name: [name] MRN: [id number] Date of Birth: [birth year]  Transition of Care [facility]) CM/SW Contact:    Zenon Mayo, RN Phone Number: 08/23/2020, 2:50 PM  Clinical Narrative:                 Patient is for dc to home today, he has no needs.    Expected Discharge Plan: Home/Self Care Barriers to Discharge: No Barriers Identified   Patient Goals and CMS Choice Patient states their goals for this hospitalization and ongoing recovery are:: return home   Choice offered to / list presented to : NA  Expected Discharge Plan and Services Expected Discharge Plan: Home/Self Care In-house Referral: NA Discharge Planning Services: CM Consult Post Acute Care Choice: NA Living arrangements for the past 2 months: Single Family Home Expected Discharge Date: 08/23/20                 DME Agency: NA       HH Arranged: NA          Prior Living Arrangements/Services Living arrangements for the past 2 months: Single Family Home Lives with:: Self Patient language and need for interpreter reviewed:: Yes Do you feel safe going back to the place where you live?: Yes      Need for Family Participation in Patient Care: No (Comment) Care giver support system in place?: No (comment)   Criminal Activity/Legal Involvement Pertinent to Current Situation/Hospitalization: No - Comment as needed  Activities of Daily Living Home Assistive Devices/Equipment: Cane (specify quad or straight) (quad) ADL Screening (condition at time of admission) Patient's cognitive ability adequate to safely complete daily activities?: Yes Is the patient deaf or have difficulty hearing?: No Does the patient have difficulty seeing, even when wearing glasses/contacts?: No Does the patient have difficulty concentrating, remembering, or making decisions?: No Patient able to express need for assistance with ADLs?: Yes Does the patient  have difficulty dressing or bathing?: Yes Independently performs ADLs?: No Communication: Independent Dressing (OT): Independent Grooming: Independent Feeding: Independent Bathing: Needs assistance Is this a change from baseline?: Pre-admission baseline Toileting: Needs assistance Is this a change from baseline?: Pre-admission baseline In/Out Bed: Needs assistance Is this a change from baseline?: Pre-admission baseline Does the patient have difficulty walking or climbing stairs?: Yes Weakness of Legs: None Weakness of Arms/Hands: None  Permission Sought/Granted                  Emotional Assessment   Attitude/Demeanor/Rapport: Engaged Affect (typically observed): Appropriate Orientation: : Oriented to Self, Oriented to Place, Oriented to Situation, Oriented to  Time Alcohol / Substance Use: Not Applicable Psych Involvement: No (comment)  Admission diagnosis:  Acute pulmonary edema ([place]) [J81.0] SOB (shortness of breath) [R06.02] Biliary stones [K80.20] Acute on chronic heart failure ([place]) [I50.9] Patient Active Problem List   Diagnosis Date Noted   Acute on chronic heart failure ([place]) 08/20/2020   Left hip pain 07/10/2020   Trapezius strain, right, initial encounter 05/20/2020   Anemia associated with chronic renal failure    CKD (chronic kidney disease)    Hemoglobinuria 12/12/2018   OSA (obstructive sleep apnea) 02/28/2018   Chronic diastolic heart failure ([place]) 02/02/2018   Healthcare maintenance 08/15/2017   Erectile dysfunction 04/22/2017   History of pulmonary embolus (PE) 06/09/2016   Depression 05/04/2016   Hypogonadism in male 05/04/2016   Morbid obesity with BMI of 40.0-44.9, adult ([place]) 10/14/2015   Dyslipidemia 08/12/2014  Uncontrolled type 2 diabetes mellitus with hyperglycemia, with long-term current use of insulin ([place]) 08/12/2014   Unilateral AKA, right ([place]) 12/26/2013   Hyperlipidemia 02/19/2013   S/P cardiac cath, 02/18/13, normal coronaries  02/19/2013   Chronic renal insufficiency, stage 3 (moderate) ([place]) 02/19/2013   Hypertension 07/24/2011   Long term current use of anticoagulant therapy 07/19/2011   History of DVT (deep vein thrombosis) 07/28/2009   Morbid obesity ([place]) 10/26/2005   Insulin dependent type 2 diabetes mellitus, controlled ([place]) 01/09/2002   PCP:  [name], MD Pharmacy:   [place] ([place]), [place] - [address] [place] ([place]) [place] [address] Phone: [phone] Fax: [phone]  [facility] [place] [facility] [address] to Reg[facility] [employer] [facility] [place] [address] Phone: [phone] Fax: [phone]     Social Determinants of Health (SDOH) Interventions    Readmission Risk Interventions Readmission Risk Prevention Plan 08/23/2020 04/16/2018  Transportation Screening Complete Complete  PCP or Specialist Appt within 3-5 Days Complete Complete  HRI or Home Care Consult Complete Complete  Social Work Consult for [place] Planning/Counseling Complete Complete  Palliative Care Screening Not Applicable Not Applicable  Medication Review Press photographer) Complete Complete  Some recent data might be hidden

## 2020-08-31 ENCOUNTER — Ambulatory Visit: Payer: Medicare Other

## 2020-08-31 NOTE — Patient Instructions (Signed)
Visit Information   Goals Addressed             This Visit's Progress    Blood Pressure < 140/90       BP Readings from Last 3 Encounters:  08/23/20 136/69  08/10/20 (!) 156/70  07/23/20 (!) 158/80    Not meeting blood pressure targets     HEMOGLOBIN A1C < 7        Lab Results  Component Value Date   HGBA1C 5.5 07/08/2020   HGBA1C 6.0 04/15/2020   HGBA1C 8.0 (A) 03/04/2020    Meeting Hgb A1C target      LDL CALC < 100        Lab Results  Component Value Date   CHOL 118 01/15/2020   HDL 30.60 (L) 01/15/2020   LDLCALC 60 01/15/2020   TRIG 140.0 01/15/2020   CHOLHDL 4 01/15/2020     Meeting lipid targets        The patient verbalized understanding of instructions, educational materials, and care plan provided today and declined offer to receive copy of patient instructions, educational materials, and care plan.   Telephone follow up appointment with care management team member scheduled for: 096/27/2022@2 :00  [name], RN, BSN, CCM Care Management Coordinator [facility] Phone: [phone] / Fax: [phone]

## 2020-08-31 NOTE — Chronic Care Management (AMB) (Signed)
Care Management    RN Visit Note  08/31/2020 Name: [name] MRN: [id number] DOB: [birth year]  Subjective: [name] is a 45 y.o. year old male who is a primary care patient of Victor Mercury, MD. The care management team was consulted for assistance with disease management and care coordination needs.    Engaged with patient by telephone for follow up visit in response to provider referral for case management and/or care coordination services.   Consent to Services:   [name] was given information about Care Management services today including:  Care Management services includes personalized support from designated clinical staff supervised by his physician, including individualized plan of care and coordination with other care providers 24/7 contact phone numbers for assistance for urgent and routine care needs. The patient may stop case management services at any time by phone call to the office staff.  Patient agreed to services and consent obtained.   Assessment: Review of patient past medical history, allergies, medications, health status, including review of consultants reports, laboratory and other test data, was performed as part of comprehensive evaluation and provision of chronic care management services.   SDOH (Social Determinants of Health) assessments and interventions performed:    Care Plan  Allergies  Allergen Reactions   Reglan [Metoclopramide] Other (See Comments)    Dysphoric reaction   Metformin And Related Diarrhea   Ozempic (0.25 Or 0.5 Mg-Dose) [Semaglutide(0.25 Or 0.75m-Dos)] Nausea And Vomiting    Outpatient Encounter Medications as of 08/31/2020  Medication Sig Note   Accu-Chek Softclix Lancets lancets Check blood sugar up to 3 times a day (Patient taking differently: 1 each by Other route See admin instructions. Check blood sugar up to 4-5 times a day)    acidophilus (RISAQUAD) CAPS capsule Take 1 capsule by mouth daily.    Alpha-Lipoic  Acid 600 MG CAPS Take 600 mg by mouth daily.    amLODipine (NORVASC) 10 MG tablet Take 10 mg by mouth at bedtime.    Blood Glucose Monitoring Suppl (ACCU-CHEK GUIDE ME) w/Device KIT Use to check blood sugar up to 3 times a day (Patient taking differently: 1 each by Other route See admin instructions. Use to check blood sugar up to 4-5 times a day)    carvedilol (COREG) 25 MG tablet Take 1 tablet (25 mg total) by mouth 2 (two) times daily with a meal.    Cyanocobalamin (B-12) 500 MCG SUBL Place 500 mcg under the tongue daily.     ELIQUIS 5 MG TABS tablet Take 1 tablet by mouth twice daily (Patient taking differently: Take 5 mg by mouth 2 (two) times daily.)    famotidine (PEPCID) 20 MG tablet Take 1 tablet (20 mg total) by mouth 2 (two) times daily. 11/28/2019: .   fluticasone (FLONASE) 50 MCG/ACT nasal spray Place 2 sprays into both nostrils daily.    furosemide (LASIX) 80 MG tablet Take 1 tablet by mouth twice daily (Patient taking differently: Take 80-160 mg by mouth 2 (two) times daily. 165min the morning and 8066mn the evening.) 02/17/2020: Pt states he needs refills   gabapentin (NEURONTIN) 100 MG capsule TAKE 1 CAPSULE BY MOUTH THREE TIMES DAILY (Patient taking differently: Take 100 mg by mouth 3 (three) times daily.)    glucose blood (ACCU-CHEK GUIDE) test strip Check blood sugar up to 3 times a day (Patient taking differently: 1 each by Other route See admin instructions. Check blood sugar up to 4-5 times a day)    hydrALAZINE (APRESOLINE) 50  MG tablet TAKE 1 TABLET BY MOUTH THREE TIMES DAILY (Patient taking differently: Take 50 mg by mouth 3 (three) times daily.)    insulin aspart (NOVOLOG) 100 UNIT/ML FlexPen Administer 5 units in the morning with breakfast, 20 units with lunch, and 25 units with dinner daily.  ICD Code:  E11.9    insulin degludec (TRESIBA FLEXTOUCH) 200 UNIT/ML FlexTouch Pen INJECT 45 UNITS SUBCUTANEOUSLY ONCE DAILY (Patient taking differently: Inject 45 Units into the skin  daily.)    Insulin Pen Needle (B-D UF III MINI PEN NEEDLES) 31G X 5 MM MISC 1 each by Other route See admin instructions. USE 4-5 TIMES DAILY    Insulin Pen Needle (PEN NEEDLES 3/16") 31G X 5 MM MISC Use five times daily    Lancet Devices (ACCU-CHEK SOFTCLIX) lancets Use to check blood sugars 5 times a day. Dx code:E11.9. Insulin dependent. (Patient taking differently: 1 each by Other route See admin instructions. Use to check blood sugars 5 times a day. Dx code:E11.9. Insulin dependent.)    lovastatin (MEVACOR) 20 MG tablet Take 1 tablet by mouth once daily (Patient taking differently: Take 20 mg by mouth daily.)    ondansetron (ZOFRAN) 4 MG tablet Take 1 tablet (4 mg total) by mouth every 6 (six) hours as needed for nausea. (Patient not taking: Reported on 08/20/2020)    oxybutynin (DITROPAN) 5 MG tablet Take 1 tablet (5 mg total) by mouth 3 (three) times daily.    polyethylene glycol (MIRALAX / GLYCOLAX) 17 g packet Take 17 g by mouth 2 (two) times daily. (Patient taking differently: Take 17 g by mouth as needed for mild constipation.)    potassium chloride SA (KLOR-CON) 20 MEQ tablet Take 20 mEq by mouth 2 (two) times daily.    senna-docusate (SENOKOT-S) 8.6-50 MG tablet Take 1 tablet by mouth daily. (Patient taking differently: Take 1 tablet by mouth as needed for mild constipation.)    sildenafil (VIAGRA) 50 MG tablet Take 50 mg by mouth as needed for erectile dysfunction.    tamsulosin (FLOMAX) 0.4 MG CAPS capsule Take 0.4 mg by mouth at bedtime.     VICTOZA 18 MG/3ML SOPN INJECT 1.2 MG INTO THE SKIN ONCE DAILY AT THE SAME TIME    No facility-administered encounter medications on file as of 08/31/2020.    Patient Active Problem List   Diagnosis Date Noted   Acute on chronic heart failure ([place]) 08/20/2020   Left hip pain 07/10/2020   Trapezius strain, right, initial encounter 05/20/2020   Anemia associated with chronic renal failure    CKD (chronic kidney disease)    Hemoglobinuria  12/12/2018   OSA (obstructive sleep apnea) 02/28/2018   Chronic diastolic heart failure ([place]) 02/02/2018   Healthcare maintenance 08/15/2017   Erectile dysfunction 04/22/2017   History of pulmonary embolus (PE) 06/09/2016   Depression 05/04/2016   Hypogonadism in male 05/04/2016   Morbid obesity with BMI of 40.0-44.9, adult ([place]) 10/14/2015   Dyslipidemia 08/12/2014   Uncontrolled type 2 diabetes mellitus with hyperglycemia, with long-term current use of insulin ([place]) 08/12/2014   Unilateral AKA, right ([place]) 12/26/2013   Hyperlipidemia 02/19/2013   S/P cardiac cath, 02/18/13, normal coronaries 02/19/2013   Chronic renal insufficiency, stage 3 (moderate) ([place]) 02/19/2013   Hypertension 07/24/2011   Long term current use of anticoagulant therapy 07/19/2011   History of DVT (deep vein thrombosis) 07/28/2009   Morbid obesity ([place]) 10/26/2005   Insulin dependent type 2 diabetes mellitus, controlled ([place]) 01/09/2002    Conditions to be  addressed/monitored: CHF, DMII, and CKD Stage 4  Care Plan : Diabetes Type 2 (Adult)  Updates made by [name], RN since 08/31/2020 12:00 AM     Problem: Currently meeting A1C target but having large fluctuations in blood sugars   Priority: High  Onset Date: 12/15/2019     Goal: Glycemic Management Optimized without large fluctuations in blood sugars   Start Date: 12/15/2019  Expected End Date: 10/08/2020  Recent Progress: On track  Priority: High  Note:   Objective:  Lab Results  Component Value Date   HGBA1C 5.5 07/08/2020   HGBA1C 6.0 04/15/2020   HGBA1C 8.0 (A) 03/04/2020   Lab Results  Component Value Date   MICROALBUR 160.61 (H) 08/14/2013   LDLCALC 60 01/15/2020   CREATININE 3.37 (H) 08/23/2020    Current Barriers:  Knowledge Deficits related to basic Diabetes pathophysiology and self care/management Unable to independently meet pre and post meal CBG targets - Successful telephone outreach to patient to complete follow up  assessment.  Patient had a recent acute admission for Chronic HF and was inpatient for 3 days, dc on 08/24/20. Discussed how patient is progressing since discharge from the hospital.  He notes that he was able to return to his exercise class on 08/26/20 and still had breathing issues.  He states he is back to his baseline this week.  Patient's CBG was 122 this morning and he was unable to provide other results.  Case Manager Clinical Goal(s):  Over the next 30-60 days, patient will demonstrate improved adherence to prescribed treatment plan for diabetes self care/management as evidenced by:  daily monitoring and recording of CBG  adherence to ADA/ carb modified diet exercise 5-7 days/week- adherence to prescribed medication regimen Interventions:  Review of patient status, including review of consultants reports, relevant laboratory and other test results, and medications completed. Assessed satisfaction with Freestyle Libre Flash Glucose monitor Assessed frequency of hypoglycemia and ability to self treat Assessed medication taking behavior Patient encouraged to continue his exercise routine with his personal trainer Positive  reinforcement given for his successful weight loss and consistency with personal training session-  - barriers to adherence to treatment plan identified - mutual A1C goal set or reviewed - devices to improve adherence to care identified - self-awareness of signs/symptoms of hypo or hyperglycemia encouraged Discussed plans with patient for ongoing care management follow up and provided patient with direct contact information for care management team Reviewed scheduled/upcoming provider appointments including: 09/07/20 10:45 injection; 09/09/20- 2 week follow up appointment with [facility] s/p admission to acute. - barriers to adherence to treatment plan identified - dental care encouraged - mutual A1C goal set or reviewed - devices to improve adherence to care identified -  self-awareness of signs/symptoms of hypo or hyperglycemia encouraged Discussed plans with patient for ongoing care management follow up and provided patient with direct contact information for care management team In the next 30-60 days, patient will:  - Self administers oral medications as prescribed Self administers insulin as prescribed Self administers injectable DM medication Victoza and Tresiba as prescribed Attends all scheduled provider appointments Checks blood sugars as prescribed and utilize hyper and hypoglycemia protocol as needed Adheres to prescribed ADA/carb modified Follow Up Plan: The care management team will reach out to the patient again over the next 30 days.       Plan: Telephone follow up appointment with care management team member scheduled for:  26 days  [name], RN, BSN, [place]  Internal Medicine Phone: [phone] / Fax: [phone]

## 2020-09-02 ENCOUNTER — Encounter (HOSPITAL_COMMUNITY): Payer: Medicare Other

## 2020-09-06 ENCOUNTER — Other Ambulatory Visit (HOSPITAL_COMMUNITY): Payer: Self-pay | Admitting: *Deleted

## 2020-09-07 ENCOUNTER — Ambulatory Visit (HOSPITAL_COMMUNITY)
Admission: RE | Admit: 2020-09-07 | Discharge: 2020-09-07 | Disposition: A | Discharge status: Home / Self Care | Payer: Medicare Other | Source: Ambulatory Visit | Attending: Nephrology | Admitting: Nephrology

## 2020-09-07 ENCOUNTER — Other Ambulatory Visit: Payer: Self-pay

## 2020-09-07 VITALS — BP 134/72 | HR 91 | Temp 98.5°F | Resp 17

## 2020-09-07 DIAGNOSIS — N183 Chronic kidney disease, stage 3 unspecified: Secondary | ICD-10-CM | POA: Diagnosis not present

## 2020-09-07 DIAGNOSIS — D631 Anemia in chronic kidney disease: Secondary | ICD-10-CM | POA: Insufficient documentation

## 2020-09-07 LAB — RENAL FUNCTION PANEL
Albumin: 3.2 g/dL — ABNORMAL LOW (ref 3.5–5.0)
Anion gap: 9 (ref 5–15)
BUN: 60 mg/dL — ABNORMAL HIGH (ref 6–20)
CO2: 21 mmol/L — ABNORMAL LOW (ref 22–32)
Calcium: 9.1 mg/dL (ref 8.9–10.3)
Chloride: 106 mmol/L (ref 98–111)
Creatinine, Ser: 3.39 mg/dL — ABNORMAL HIGH (ref 0.61–1.24)
GFR, Estimated: 22 mL/min — ABNORMAL LOW (ref >60)
Glucose, Bld: 122 mg/dL — ABNORMAL HIGH (ref 70–99)
Phosphorus: 5.1 mg/dL — ABNORMAL HIGH (ref 2.5–4.6)
Potassium: 4.3 mmol/L (ref 3.5–5.1)
Sodium: 136 mmol/L (ref 135–145)

## 2020-09-07 LAB — IRON AND TIBC
Iron: 41 ug/dL — ABNORMAL LOW (ref 45–182)
Saturation Ratios: 15 % — ABNORMAL LOW (ref 17.9–39.5)
TIBC: 280 ug/dL (ref 250–450)
UIBC: 239 ug/dL

## 2020-09-07 LAB — FERRITIN: Ferritin: 279 ng/mL (ref 24–336)

## 2020-09-07 MED ORDER — DARBEPOETIN ALFA 200 MCG/0.4ML IJ SOSY
PREFILLED_SYRINGE | INTRAMUSCULAR | Status: AC
  Filled 2020-09-07: qty 0.4

## 2020-09-07 MED ORDER — DARBEPOETIN ALFA 200 MCG/0.4ML IJ SOSY
200.0000 ug | PREFILLED_SYRINGE | INTRAMUSCULAR | Status: DC
  Administered 2020-09-07: 200 ug via SUBCUTANEOUS

## 2020-09-08 DIAGNOSIS — E113311 Type 2 diabetes mellitus with moderate nonproliferative diabetic retinopathy with macular edema, right eye: Secondary | ICD-10-CM | POA: Diagnosis not present

## 2020-09-08 LAB — POCT HEMOGLOBIN-HEMACUE: Hemoglobin: 8.4 g/dL — ABNORMAL LOW (ref 13.0–17.0)

## 2020-09-09 ENCOUNTER — Encounter: Payer: Medicare Other | Admitting: Internal Medicine

## 2020-09-09 DIAGNOSIS — E876 Hypokalemia: Secondary | ICD-10-CM | POA: Diagnosis not present

## 2020-09-09 DIAGNOSIS — N184 Chronic kidney disease, stage 4 (severe): Secondary | ICD-10-CM | POA: Diagnosis not present

## 2020-09-09 DIAGNOSIS — N2581 Secondary hyperparathyroidism of renal origin: Secondary | ICD-10-CM | POA: Diagnosis not present

## 2020-09-09 DIAGNOSIS — I129 Hypertensive chronic kidney disease with stage 1 through stage 4 chronic kidney disease, or unspecified chronic kidney disease: Secondary | ICD-10-CM | POA: Diagnosis not present

## 2020-09-09 DIAGNOSIS — E669 Obesity, unspecified: Secondary | ICD-10-CM | POA: Diagnosis not present

## 2020-09-14 ENCOUNTER — Encounter: Payer: Self-pay | Admitting: Internal Medicine

## 2020-09-14 ENCOUNTER — Ambulatory Visit (INDEPENDENT_AMBULATORY_CARE_PROVIDER_SITE_OTHER): Payer: Medicare Other | Admitting: Internal Medicine

## 2020-09-14 VITALS — BP 140/79 | HR 91

## 2020-09-14 DIAGNOSIS — I5032 Chronic diastolic (congestive) heart failure: Secondary | ICD-10-CM

## 2020-09-14 DIAGNOSIS — H8110 Benign paroxysmal vertigo, unspecified ear: Secondary | ICD-10-CM | POA: Diagnosis not present

## 2020-09-14 DIAGNOSIS — N529 Male erectile dysfunction, unspecified: Secondary | ICD-10-CM | POA: Diagnosis not present

## 2020-09-14 DIAGNOSIS — I10 Essential (primary) hypertension: Secondary | ICD-10-CM

## 2020-09-14 DIAGNOSIS — E785 Hyperlipidemia, unspecified: Secondary | ICD-10-CM | POA: Diagnosis not present

## 2020-09-14 DIAGNOSIS — Z86718 Personal history of other venous thrombosis and embolism: Secondary | ICD-10-CM

## 2020-09-14 MED ORDER — SILDENAFIL CITRATE 50 MG PO TABS: 50.0000 mg | ORAL_TABLET | ORAL | 0 refills | Status: DC | PRN

## 2020-09-14 MED ORDER — LOVASTATIN 20 MG PO TABS: 20.0000 mg | ORAL_TABLET | Freq: Every day | ORAL | 1 refills | Status: DC

## 2020-09-14 MED ORDER — APIXABAN 5 MG PO TABS: 5.0000 mg | ORAL_TABLET | Freq: Two times a day (BID) | ORAL | 0 refills | Status: DC

## 2020-09-14 NOTE — Progress Notes (Signed)
CC: HF follow up  HPI:  [name] is a 45 y.o. with a PMHx as listed below who presents to the clinic for HF follow up.   Please see the Encounters tab for problem-based Assessment & Plan regarding status of patient's acute and chronic conditions.  Past Medical History:  Diagnosis Date   Acute venous embolism and thrombosis of deep vessels of proximal lower extremity (HCC) 07/19/2011   Anemia    Anginal pain (HCC)    pt denies   Anxiety    Chest pain, neg MI, normal coronaries by cath 02/18/2013   pt denies   CHF (congestive heart failure) (HCC)    CKD (chronic kidney disease) stage 3, GFR 30-59 ml/min ([place]) 02/19/2013   Colon polyps    Depression    Diabetic ulcer of right foot ([place])    DVT (deep venous thrombosis) ([place]) 09/2002   patient reports additional DVTs in '06 & '11 (unconfirmed)   ED (erectile dysfunction)    GERD (gastroesophageal reflux disease)    History of blood transfusion    "related to OR" (10/31/2016)   Hyperlipidemia 02/19/2013   Hypertension    Nausea & vomiting    "constant for the last couple weeks" (10/31/2016)   Nephrotic syndrome    Obesity    BMI 44, weight 346 pounds 01/30/14   OSA (obstructive sleep apnea) 02/28/2018   Mild obstructive sleep apnea with an AHI of 9.8/h but severe during REM sleep with an AHI of 33.8/h.  Oxygen saturations dropped to 86% and there was moderate snoring   Peripheral edema    Pneumonia    Prosthesis adjustment 08/17/2016   Pulmonary embolism ([place]) 09/2002   treated with 6 months of warfarin   Pyelonephritis 02/02/2018   Type I diabetes mellitus ([place]) dx'd 2001   Urinary retention    Review of Systems: Review of Systems  Constitutional:  Positive for weight loss (intentional). Negative for chills and fever.  HENT:  Negative for ear discharge, ear pain, hearing loss and tinnitus.   Respiratory:  Negative for cough and shortness of breath.   Cardiovascular:  Positive for leg swelling (improving). Negative for  orthopnea.  Gastrointestinal:  Negative for abdominal pain, diarrhea, nausea and vomiting.  Skin:  Negative for itching and rash.  Neurological:  Positive for dizziness.   Physical Exam:  Vitals:   09/14/20 1532  BP: 140/79  Pulse: 91  SpO2: 98%   Physical Exam Constitutional:      General: He is not in acute distress.    Appearance: He is obese.  HENT:     Head: Normocephalic and atraumatic.  Eyes:     General: Lids are normal.     Extraocular Movements: Extraocular movements intact.     Conjunctiva/sclera: Conjunctivae normal.     Comments: Horizontal nystagmus, bilateral  Cardiovascular:     Rate and Rhythm: Normal rate and regular rhythm.     Comments: Pitting edema improved compared to prior examination Pulmonary:     Effort: Pulmonary effort is normal. No respiratory distress.     Breath sounds: Normal breath sounds. No wheezing, rhonchi or rales.  Abdominal:     General: Bowel sounds are normal.     Palpations: Abdomen is soft.  Musculoskeletal:     Left lower leg: 1+ Pitting Edema present.     Right Lower Extremity: Right leg is amputated above knee.  Neurological:     Mental Status: He is alert and oriented to person, place, and time.  Mental status is at baseline.  Psychiatric:        Mood and Affect: Mood normal.        Behavior: Behavior normal.        Thought Content: Thought content normal.        Judgment: Judgment normal.    Assessment & Plan:   See Encounters Tab for problem based charting.  Patient discussed with Dr. Heber Sumner

## 2020-09-14 NOTE — Assessment & Plan Note (Addendum)
[name] states he continues to have difficulty obtaining and maintaining an erection. These symptoms have become embarrassing for him. He has not had adequate response with current dose of Viagra.   Assessment/Plan:  - Increase dose of Viagra to 100 mg taken 1 hour before

## 2020-09-14 NOTE — Assessment & Plan Note (Signed)
Refilled lovastatin 

## 2020-09-14 NOTE — Assessment & Plan Note (Signed)
BP: 140/79   [name] states he is taking Coreg 25 mg BID, Amlodipine 10 mg daily, Hydralazine 50 mg TID. He is concerned about his Coreg as he read that it can cause ED. He is hoping he can switch to a different medication.   Assessment/Plan: Discussed with [name] that with continued weight loss, his BP will improve. In that case, we can likely decrease or discontinue Coreg (only indication is HTN given he has preserved EF), however his BP today is above goal. He agrees to continue taking Coreg daily until he reaches his goal weight and sees improvements in his BP.   - Continue Coreg, amlodipine, hydralazine

## 2020-09-14 NOTE — Assessment & Plan Note (Signed)
[name] presents today for follow-up from his recent hospitalization for acute on chronic heart failure. Since being discharged, he has been taking Lasix 80 mg twice a day in addition to wearing his TED hose.  He feels that this is helping his swelling significantly.  He denies any orthopnea at this time.  He has followed up with nephrology since his discharge, who is helping to manage his diuretics.  Assessment/plan: - Continue with current diuretic dose; Lasix 80 mg twice daily - We will need to obtain records from recent nephrology visit where they obtained labs. - Follow-up with Dr. Gilford Rile in 2 to 3 months

## 2020-09-14 NOTE — Assessment & Plan Note (Signed)
[name] describes several month history of dizziness when he lays down that is particularly worse when he turns his head while laying down.  He occasionally has the dizziness sensation when going from sitting up to laying down position.  Dizziness is relieved by staying still.  He notes that the symptoms come and go but predominantly occur at night.  He denies any hearing loss, tinnitus, ear discharge, vision changes.  Assessment/plan: Physical examination is reassuring.  Description of symptoms most consistent with BPPV.  He would benefit from vestibular physical therapy including Dix Hallpike maneuver.   - Referral to PT placed

## 2020-09-14 NOTE — Patient Instructions (Addendum)
It was nice seeing you today! Thank you for choosing [facility] for your Primary Care.    Today we talked about:   Continue taking Lasix 80 mg twice a day as recommended by Dr. Joelyn Oms.   Regarding Carvedilol, we can likely decrease the dose as your lose weight and your blood pressure decreases. For now, I would recommend as needed Viagra. You can take up to 100 mg. I have sent a refill to the pharmacy.  The dizziness you are experiencing is consistent with Benign Paroxysmal Positional Vertigo. I have sent in a referral to physical therapy.    Follow up with Dr. Gilford Rile in 2-3 months.

## 2020-09-14 NOTE — Assessment & Plan Note (Signed)
Refilled Eliquis.

## 2020-09-16 ENCOUNTER — Encounter (HOSPITAL_COMMUNITY)
Admission: RE | Admit: 2020-09-16 | Discharge: 2020-09-16 | Disposition: A | Discharge status: Home / Self Care | Payer: Medicare Other | Source: Ambulatory Visit | Attending: Nephrology | Admitting: Nephrology

## 2020-09-16 ENCOUNTER — Other Ambulatory Visit: Payer: Self-pay

## 2020-09-16 DIAGNOSIS — N183 Chronic kidney disease, stage 3 unspecified: Secondary | ICD-10-CM | POA: Diagnosis not present

## 2020-09-16 DIAGNOSIS — D631 Anemia in chronic kidney disease: Secondary | ICD-10-CM | POA: Insufficient documentation

## 2020-09-16 MED ORDER — FERUMOXYTOL INJECTION 510 MG/17 ML
510.0000 mg | INTRAVENOUS | Status: DC
  Administered 2020-09-16: 510 mg via INTRAVENOUS
  Filled 2020-09-16: qty 510

## 2020-09-18 NOTE — Progress Notes (Signed)
Internal Medicine Clinic Attending  I saw and evaluated the patient.  I personally confirmed the key portions of the history and exam documented by Dr. Basaraba and I reviewed pertinent patient test results.  The assessment, diagnosis, and plan were formulated together and I agree with the documentation in the resident's note.    

## 2020-09-23 ENCOUNTER — Ambulatory Visit (INDEPENDENT_AMBULATORY_CARE_PROVIDER_SITE_OTHER): Payer: Medicare Other | Admitting: Internal Medicine

## 2020-09-23 ENCOUNTER — Encounter: Payer: Self-pay | Admitting: Internal Medicine

## 2020-09-23 VITALS — BP 143/71 | HR 82 | Temp 98.1°F | Resp 24 | Ht 74.0 in | Wt 336.7 lb

## 2020-09-23 DIAGNOSIS — Z0000 Encounter for general adult medical examination without abnormal findings: Secondary | ICD-10-CM | POA: Diagnosis not present

## 2020-09-23 DIAGNOSIS — Z23 Encounter for immunization: Secondary | ICD-10-CM

## 2020-09-23 DIAGNOSIS — S46811A Strain of other muscles, fascia and tendons at shoulder and upper arm level, right arm, initial encounter: Secondary | ICD-10-CM

## 2020-09-23 DIAGNOSIS — S46319A Strain of muscle, fascia and tendon of triceps, unspecified arm, initial encounter: Secondary | ICD-10-CM | POA: Diagnosis not present

## 2020-09-23 MED ORDER — CYCLOBENZAPRINE HCL 5 MG PO TABS: 5.0000 mg | ORAL_TABLET | Freq: Three times a day (TID) | ORAL | 0 refills | Status: DC | PRN

## 2020-09-23 NOTE — Progress Notes (Signed)
   CC: Arm Pain   HPI:  [name] is a 45 y.o. person, with a PMH noted below, who presents to the clinic Arm pain. To see the management of their acute and chronic conditions, please see the A&P note under the Encounters tab.   Past Medical History:  Diagnosis Date   Acute venous embolism and thrombosis of deep vessels of proximal lower extremity (HCC) 07/19/2011   Anemia    Anginal pain (HCC)    pt denies   Anxiety    Chest pain, neg MI, normal coronaries by cath 02/18/2013   pt denies   CHF (congestive heart failure) (HCC)    CKD (chronic kidney disease) stage 3, GFR 30-59 ml/min ([place]) 02/19/2013   Colon polyps    Depression    Diabetic ulcer of right foot ([place])    DVT (deep venous thrombosis) ([place]) 09/2002   patient reports additional DVTs in '06 & '11 (unconfirmed)   ED (erectile dysfunction)    GERD (gastroesophageal reflux disease)    History of blood transfusion    "related to OR" (10/31/2016)   Hyperlipidemia 02/19/2013   Hypertension    Nausea & vomiting    "constant for the last couple weeks" (10/31/2016)   Nephrotic syndrome    Obesity    BMI 44, weight 346 pounds 01/30/14   OSA (obstructive sleep apnea) 02/28/2018   Mild obstructive sleep apnea with an AHI of 9.8/h but severe during REM sleep with an AHI of 33.8/h.  Oxygen saturations dropped to 86% and there was moderate snoring   Peripheral edema    Pneumonia    Prosthesis adjustment 08/17/2016   Pulmonary embolism ([place]) 09/2002   treated with 6 months of warfarin   Pyelonephritis 02/02/2018   Type I diabetes mellitus ([place]) dx'd 2001   Urinary retention    Review of Systems:   Review of Systems  Constitutional:  Negative for chills, fever and weight loss.  Musculoskeletal:  Positive for joint pain. Negative for back pain, falls, myalgias and neck pain.  Neurological:  Negative for tingling, tremors, sensory change and weakness.    Physical Exam:  Vitals:   09/23/20 0937  BP: (!) 143/71  Pulse: 82   Resp: (!) 24  Temp: 98.1 F (36.7 C)  TempSrc: Oral  SpO2: 99%  Weight: (!) 336 lb 11.2 oz (152.7 kg)  Height: 6\' 2"  (1.88 m)   Physical Exam Constitutional:      General: He is not in acute distress.    Appearance: He is not ill-appearing, toxic-appearing or diaphoretic.     Comments: Sitting comfortably in his wheelchair, answers questions appropriately, no acute distress  HENT:     Head: Normocephalic and atraumatic.  Cardiovascular:     Rate and Rhythm: Normal rate.  Pulmonary:     Effort: Pulmonary effort is normal.  Musculoskeletal:     Comments: shoulder full range of motion intact bilaterally Grip strength 5 out of 5 bilaterally Tenderness to palpation along the trapezius, with no gross deformities, erythema, ecchymoses, or rash. Right AKA with prosthetic  Neurological:     General: No focal deficit present.     Mental Status: He is alert and oriented to person, place, and time.  Psychiatric:        Mood and Affect: Mood normal.        Behavior: Behavior normal.     Assessment & Plan:   See Encounters Tab for problem based charting.  Patient discussed with Dr. Daryll Drown

## 2020-09-23 NOTE — Patient Instructions (Addendum)
To [name],   It was a pleasure seeing you again! Today we discussed your shoulder pain. It appears you have overworked your triceps. In addition to the heat, tylenol, ibuprofen that you are using to alleviate the pain, I will give you a short course of a muscle relaxant (Flexeril) to use as needed up to three times a day. Please avoid heavy machinery use while taking Flexeril, as it can make you drowsy. Please avoid overuse of your left arm, if you are going to life weights, please use the lightest weight possible.  Victor Mercury, MD

## 2020-09-24 ENCOUNTER — Encounter: Payer: Self-pay | Admitting: Internal Medicine

## 2020-09-24 NOTE — Assessment & Plan Note (Signed)
Received flu vaccine today

## 2020-09-24 NOTE — Assessment & Plan Note (Deleted)
Ms. [name] presents to the clinic with complaints of shoulder and arm pain.  He states that this occurred several hours after doing a new triceps workout at the gym.  He describes the pain as tight that spasms. He has been taking NSAIDs and using heat pack which does alleviate his symptoms.

## 2020-09-24 NOTE — Assessment & Plan Note (Addendum)
[name] presents to the clinic with complaints of shoulder and arm pain.  He states that this occurred several hours after doing a new triceps workout at the gym. He describes the pain as tight that spasms. He has been taking ibuprofen, Voltaren, and using heat pack which minimally alleviate his symptoms.  Assessment: Patient presents with shoulder and arm pain several hours after new exercise.  On physical examination shoulder has full range of motion, strength is intact, shoulder and arm are neurologically intact, and there is tenderness to palpation along the triceps. Is consistent with tricep strain. We discussed the findings of the physical examination, and continued use of NSAIDs and heat pack for his symptoms. Discussed giving his arm adequate rest, which is difficult due to [name] need to transfer in and out of his wheelchair. He additionally endorses no to be difficult for him to stop going to the gym, but will utilize weights possible if he is going to be doing any weightlifting.  We will prescribe a short course of muscle relaxant, we discussed the adverse side effects of this medication. - Ordered Flexeril 5 mg 3 times daily as needed - Continue conservative management

## 2020-09-28 ENCOUNTER — Other Ambulatory Visit: Payer: Self-pay

## 2020-09-28 ENCOUNTER — Encounter (HOSPITAL_COMMUNITY)
Admission: RE | Admit: 2020-09-28 | Discharge: 2020-09-28 | Disposition: A | Discharge status: Home / Self Care | Payer: Medicare Other | Source: Ambulatory Visit | Attending: Nephrology | Admitting: Nephrology

## 2020-09-28 VITALS — BP 149/79 | HR 91 | Temp 98.8°F | Resp 20 | Ht 74.0 in | Wt 336.0 lb

## 2020-09-28 DIAGNOSIS — N183 Chronic kidney disease, stage 3 unspecified: Secondary | ICD-10-CM | POA: Diagnosis not present

## 2020-09-28 LAB — RENAL FUNCTION PANEL
Albumin: 3.2 g/dL — ABNORMAL LOW (ref 3.5–5.0)
Anion gap: 10 (ref 5–15)
BUN: 69 mg/dL — ABNORMAL HIGH (ref 6–20)
CO2: 19 mmol/L — ABNORMAL LOW (ref 22–32)
Calcium: 9 mg/dL (ref 8.9–10.3)
Chloride: 106 mmol/L (ref 98–111)
Creatinine, Ser: 3 mg/dL — ABNORMAL HIGH (ref 0.61–1.24)
GFR, Estimated: 26 mL/min — ABNORMAL LOW (ref >60)
Glucose, Bld: 124 mg/dL — ABNORMAL HIGH (ref 70–99)
Phosphorus: 4.2 mg/dL (ref 2.5–4.6)
Potassium: 3.9 mmol/L (ref 3.5–5.1)
Sodium: 135 mmol/L (ref 135–145)

## 2020-09-28 LAB — POCT HEMOGLOBIN-HEMACUE: Hemoglobin: 9 g/dL — ABNORMAL LOW (ref 13.0–17.0)

## 2020-09-28 MED ORDER — SODIUM CHLORIDE 0.9 % IV SOLN
510.0000 mg | INTRAVENOUS | Status: AC
  Administered 2020-09-28: 510 mg via INTRAVENOUS
  Filled 2020-09-28: qty 510

## 2020-09-28 MED ORDER — DARBEPOETIN ALFA 200 MCG/0.4ML IJ SOSY
PREFILLED_SYRINGE | INTRAMUSCULAR | Status: AC
  Filled 2020-09-28: qty 0.4

## 2020-09-28 MED ORDER — DARBEPOETIN ALFA 200 MCG/0.4ML IJ SOSY
200.0000 ug | PREFILLED_SYRINGE | INTRAMUSCULAR | Status: DC
  Administered 2020-09-28: 200 ug via SUBCUTANEOUS

## 2020-10-01 ENCOUNTER — Other Ambulatory Visit (HOSPITAL_COMMUNITY): Payer: Self-pay

## 2020-10-01 ENCOUNTER — Inpatient Hospital Stay (HOSPITAL_COMMUNITY)
Admission: EM | Admit: 2020-10-01 | Discharge: 2020-10-03 | DRG: 291 | Disposition: A | Discharge status: Home / Self Care | Payer: Medicare Other | Attending: Internal Medicine | Admitting: Internal Medicine

## 2020-10-01 ENCOUNTER — Telehealth: Payer: Self-pay | Admitting: Student

## 2020-10-01 ENCOUNTER — Emergency Department (HOSPITAL_COMMUNITY): Payer: Medicare Other

## 2020-10-01 ENCOUNTER — Other Ambulatory Visit: Payer: Self-pay

## 2020-10-01 ENCOUNTER — Encounter (HOSPITAL_COMMUNITY): Payer: Self-pay | Admitting: Internal Medicine

## 2020-10-01 ENCOUNTER — Inpatient Hospital Stay (HOSPITAL_COMMUNITY): Payer: Medicare Other

## 2020-10-01 DIAGNOSIS — J9690 Respiratory failure, unspecified, unspecified whether with hypoxia or hypercapnia: Secondary | ICD-10-CM | POA: Diagnosis present

## 2020-10-01 DIAGNOSIS — E1122 Type 2 diabetes mellitus with diabetic chronic kidney disease: Secondary | ICD-10-CM | POA: Diagnosis present

## 2020-10-01 DIAGNOSIS — Z888 Allergy status to other drugs, medicaments and biological substances status: Secondary | ICD-10-CM

## 2020-10-01 DIAGNOSIS — J811 Chronic pulmonary edema: Secondary | ICD-10-CM | POA: Diagnosis not present

## 2020-10-01 DIAGNOSIS — K219 Gastro-esophageal reflux disease without esophagitis: Secondary | ICD-10-CM | POA: Diagnosis present

## 2020-10-01 DIAGNOSIS — I130 Hypertensive heart and chronic kidney disease with heart failure and stage 1 through stage 4 chronic kidney disease, or unspecified chronic kidney disease: Principal | ICD-10-CM | POA: Diagnosis present

## 2020-10-01 DIAGNOSIS — Z20822 Contact with and (suspected) exposure to covid-19: Secondary | ICD-10-CM | POA: Diagnosis present

## 2020-10-01 DIAGNOSIS — R0789 Other chest pain: Secondary | ICD-10-CM | POA: Diagnosis not present

## 2020-10-01 DIAGNOSIS — I517 Cardiomegaly: Secondary | ICD-10-CM | POA: Diagnosis not present

## 2020-10-01 DIAGNOSIS — Z79899 Other long term (current) drug therapy: Secondary | ICD-10-CM

## 2020-10-01 DIAGNOSIS — I5033 Acute on chronic diastolic (congestive) heart failure: Secondary | ICD-10-CM | POA: Diagnosis not present

## 2020-10-01 DIAGNOSIS — R079 Chest pain, unspecified: Secondary | ICD-10-CM | POA: Diagnosis not present

## 2020-10-01 DIAGNOSIS — Z89611 Acquired absence of right leg above knee: Secondary | ICD-10-CM

## 2020-10-01 DIAGNOSIS — G4733 Obstructive sleep apnea (adult) (pediatric): Secondary | ICD-10-CM | POA: Diagnosis present

## 2020-10-01 DIAGNOSIS — R0902 Hypoxemia: Secondary | ICD-10-CM | POA: Diagnosis not present

## 2020-10-01 DIAGNOSIS — I509 Heart failure, unspecified: Secondary | ICD-10-CM

## 2020-10-01 DIAGNOSIS — Z8249 Family history of ischemic heart disease and other diseases of the circulatory system: Secondary | ICD-10-CM

## 2020-10-01 DIAGNOSIS — Z833 Family history of diabetes mellitus: Secondary | ICD-10-CM

## 2020-10-01 DIAGNOSIS — R609 Edema, unspecified: Secondary | ICD-10-CM | POA: Diagnosis not present

## 2020-10-01 DIAGNOSIS — J9601 Acute respiratory failure with hypoxia: Secondary | ICD-10-CM | POA: Diagnosis not present

## 2020-10-01 DIAGNOSIS — J810 Acute pulmonary edema: Secondary | ICD-10-CM | POA: Diagnosis not present

## 2020-10-01 DIAGNOSIS — R0609 Other forms of dyspnea: Secondary | ICD-10-CM | POA: Diagnosis not present

## 2020-10-01 DIAGNOSIS — Z794 Long term (current) use of insulin: Secondary | ICD-10-CM

## 2020-10-01 DIAGNOSIS — I10 Essential (primary) hypertension: Secondary | ICD-10-CM | POA: Diagnosis not present

## 2020-10-01 DIAGNOSIS — N184 Chronic kidney disease, stage 4 (severe): Secondary | ICD-10-CM | POA: Diagnosis not present

## 2020-10-01 DIAGNOSIS — E785 Hyperlipidemia, unspecified: Secondary | ICD-10-CM | POA: Diagnosis present

## 2020-10-01 DIAGNOSIS — Z86718 Personal history of other venous thrombosis and embolism: Secondary | ICD-10-CM

## 2020-10-01 DIAGNOSIS — Z7901 Long term (current) use of anticoagulants: Secondary | ICD-10-CM

## 2020-10-01 DIAGNOSIS — Z86711 Personal history of pulmonary embolism: Secondary | ICD-10-CM

## 2020-10-01 LAB — COMPREHENSIVE METABOLIC PANEL
ALT: 13 U/L (ref 0–44)
AST: 13 U/L — ABNORMAL LOW (ref 15–41)
Albumin: 3.1 g/dL — ABNORMAL LOW (ref 3.5–5.0)
Alkaline Phosphatase: 67 U/L (ref 38–126)
Anion gap: 11 (ref 5–15)
BUN: 59 mg/dL — ABNORMAL HIGH (ref 6–20)
CO2: 17 mmol/L — ABNORMAL LOW (ref 22–32)
Calcium: 9.2 mg/dL (ref 8.9–10.3)
Chloride: 110 mmol/L (ref 98–111)
Creatinine, Ser: 3.14 mg/dL — ABNORMAL HIGH (ref 0.61–1.24)
GFR, Estimated: 24 mL/min — ABNORMAL LOW (ref >60)
Glucose, Bld: 135 mg/dL — ABNORMAL HIGH (ref 70–99)
Potassium: 4.2 mmol/L (ref 3.5–5.1)
Sodium: 138 mmol/L (ref 135–145)
Total Bilirubin: 1.2 mg/dL (ref 0.3–1.2)
Total Protein: 7.1 g/dL (ref 6.5–8.1)

## 2020-10-01 LAB — CBC
HCT: 28.4 % — ABNORMAL LOW (ref 39.0–52.0)
Hemoglobin: 8.7 g/dL — ABNORMAL LOW (ref 13.0–17.0)
MCH: 28.4 pg (ref 26.0–34.0)
MCHC: 30.6 g/dL (ref 30.0–36.0)
MCV: 92.8 fL (ref 80.0–100.0)
Platelets: 250 10*3/uL (ref 150–400)
RBC: 3.06 MIL/uL — ABNORMAL LOW (ref 4.22–5.81)
RDW: 14.8 % (ref 11.5–15.5)
WBC: 13.2 10*3/uL — ABNORMAL HIGH (ref 4.0–10.5)
nRBC: 0 % (ref 0.0–0.2)

## 2020-10-01 LAB — GLUCOSE, CAPILLARY: Glucose-Capillary: 182 mg/dL — ABNORMAL HIGH (ref 70–99)

## 2020-10-01 LAB — ECHOCARDIOGRAM COMPLETE
AR max vel: 2.67 cm2
AV Area VTI: 2.79 cm2
AV Area mean vel: 2.65 cm2
AV Mean grad: 5 mmHg
AV Peak grad: 8.3 mmHg
Ao pk vel: 1.44 m/s
Height: 74 in
S' Lateral: 3.3 cm
Weight: 5376 oz

## 2020-10-01 LAB — SARS CORONAVIRUS 2 (TAT 6-24 HRS): SARS Coronavirus 2: NEGATIVE

## 2020-10-01 LAB — TROPONIN I (HIGH SENSITIVITY)
Troponin I (High Sensitivity): 30 ng/L — ABNORMAL HIGH (ref <18)
Troponin I (High Sensitivity): 36 ng/L — ABNORMAL HIGH (ref <18)
Troponin I (High Sensitivity): 33 ng/L — ABNORMAL HIGH (ref <18)

## 2020-10-01 LAB — BRAIN NATRIURETIC PEPTIDE: B Natriuretic Peptide: 304.5 pg/mL — ABNORMAL HIGH (ref 0.0–100.0)

## 2020-10-01 LAB — CBG MONITORING, ED: Glucose-Capillary: 115 mg/dL — ABNORMAL HIGH (ref 70–99)

## 2020-10-01 MED ORDER — APIXABAN 5 MG PO TABS
5.0000 mg | ORAL_TABLET | Freq: Two times a day (BID) | ORAL | Status: DC
  Administered 2020-10-01 – 2020-10-03 (×5): 5 mg via ORAL
  Filled 2020-10-01 (×3): qty 1
  Filled 2020-10-01: qty 2
  Filled 2020-10-01: qty 1

## 2020-10-01 MED ORDER — TAMSULOSIN HCL 0.4 MG PO CAPS
0.4000 mg | ORAL_CAPSULE | Freq: Every day | ORAL | Status: DC
  Administered 2020-10-01 – 2020-10-02 (×2): 0.4 mg via ORAL
  Filled 2020-10-01 (×2): qty 1

## 2020-10-01 MED ORDER — GABAPENTIN 100 MG PO CAPS
100.0000 mg | ORAL_CAPSULE | Freq: Three times a day (TID) | ORAL | Status: DC
  Administered 2020-10-01 – 2020-10-03 (×7): 100 mg via ORAL
  Filled 2020-10-01 (×8): qty 1

## 2020-10-01 MED ORDER — INSULIN DETEMIR 100 UNIT/ML ~~LOC~~ SOLN
25.0000 [IU] | Freq: Every day | SUBCUTANEOUS | Status: DC
  Administered 2020-10-01 – 2020-10-02 (×2): 25 [IU] via SUBCUTANEOUS
  Filled 2020-10-01 (×4): qty 0.25

## 2020-10-01 MED ORDER — FUROSEMIDE 10 MG/ML IJ SOLN
80.0000 mg | Freq: Two times a day (BID) | INTRAMUSCULAR | Status: DC
  Administered 2020-10-01 (×2): 80 mg via INTRAVENOUS
  Filled 2020-10-01 (×2): qty 8

## 2020-10-01 MED ORDER — POTASSIUM CHLORIDE CRYS ER 20 MEQ PO TBCR
20.0000 meq | EXTENDED_RELEASE_TABLET | Freq: Two times a day (BID) | ORAL | Status: DC
  Administered 2020-10-01 – 2020-10-03 (×5): 20 meq via ORAL
  Filled 2020-10-01 (×5): qty 1

## 2020-10-01 MED ORDER — SODIUM CHLORIDE 0.9% FLUSH: 3.0000 mL | INTRAVENOUS | Status: DC | PRN

## 2020-10-01 MED ORDER — HYDRALAZINE HCL 25 MG PO TABS: 50.0000 mg | ORAL_TABLET | Freq: Three times a day (TID) | ORAL | Status: DC

## 2020-10-01 MED ORDER — OXYBUTYNIN CHLORIDE 5 MG PO TABS
5.0000 mg | ORAL_TABLET | Freq: Three times a day (TID) | ORAL | Status: DC
  Administered 2020-10-01 – 2020-10-03 (×7): 5 mg via ORAL
  Filled 2020-10-01 (×7): qty 1

## 2020-10-01 MED ORDER — HYDRALAZINE HCL 50 MG PO TABS
100.0000 mg | ORAL_TABLET | Freq: Three times a day (TID) | ORAL | Status: DC
  Administered 2020-10-01 – 2020-10-03 (×7): 100 mg via ORAL
  Filled 2020-10-01 (×7): qty 2

## 2020-10-01 MED ORDER — FUROSEMIDE 10 MG/ML IJ SOLN
80.0000 mg | Freq: Once | INTRAMUSCULAR | Status: AC
  Administered 2020-10-01: 80 mg via INTRAVENOUS
  Filled 2020-10-01: qty 8

## 2020-10-01 MED ORDER — CARVEDILOL 25 MG PO TABS
25.0000 mg | ORAL_TABLET | Freq: Two times a day (BID) | ORAL | Status: DC
  Administered 2020-10-01 – 2020-10-03 (×6): 25 mg via ORAL
  Filled 2020-10-01 (×5): qty 1
  Filled 2020-10-01: qty 2

## 2020-10-01 MED ORDER — ONDANSETRON HCL 4 MG/2ML IJ SOLN: 4.0000 mg | Freq: Four times a day (QID) | INTRAMUSCULAR | Status: DC | PRN

## 2020-10-01 MED ORDER — POLYETHYLENE GLYCOL 3350 17 G PO PACK: 17.0000 g | PACK | ORAL | Status: DC | PRN

## 2020-10-01 MED ORDER — INSULIN ASPART 100 UNIT/ML IJ SOLN: 4.0000 [IU] | Freq: Three times a day (TID) | INTRAMUSCULAR | Status: DC

## 2020-10-01 MED ORDER — SODIUM CHLORIDE 0.9 % IV SOLN: 250.0000 mL | INTRAVENOUS | Status: DC | PRN

## 2020-10-01 MED ORDER — AMLODIPINE BESYLATE 10 MG PO TABS
10.0000 mg | ORAL_TABLET | Freq: Every day | ORAL | Status: DC
  Administered 2020-10-01 – 2020-10-02 (×2): 10 mg via ORAL
  Filled 2020-10-01 (×2): qty 1

## 2020-10-01 MED ORDER — PRAVASTATIN SODIUM 40 MG PO TABS
40.0000 mg | ORAL_TABLET | Freq: Every day | ORAL | Status: DC
  Administered 2020-10-02 – 2020-10-03 (×2): 40 mg via ORAL
  Filled 2020-10-01 (×2): qty 1

## 2020-10-01 MED ORDER — RISAQUAD PO CAPS
1.0000 | ORAL_CAPSULE | Freq: Every day | ORAL | Status: DC
  Administered 2020-10-01 – 2020-10-03 (×3): 1 via ORAL
  Filled 2020-10-01 (×3): qty 1

## 2020-10-01 MED ORDER — VITAMIN B-12 1000 MCG PO TABS
500.0000 ug | ORAL_TABLET | Freq: Every day | ORAL | Status: DC
  Administered 2020-10-01 – 2020-10-03 (×3): 500 ug via ORAL
  Filled 2020-10-01 (×3): qty 1

## 2020-10-01 MED ORDER — INSULIN ASPART 100 UNIT/ML IJ SOLN
0.0000 [IU] | Freq: Three times a day (TID) | INTRAMUSCULAR | Status: DC
  Administered 2020-10-02: 2 [IU] via SUBCUTANEOUS
  Administered 2020-10-02: 3 [IU] via SUBCUTANEOUS
  Administered 2020-10-02: 2 [IU] via SUBCUTANEOUS
  Administered 2020-10-03: 5 [IU] via SUBCUTANEOUS
  Administered 2020-10-03: 3 [IU] via SUBCUTANEOUS
  Administered 2020-10-03: 5 [IU] via SUBCUTANEOUS

## 2020-10-01 MED ORDER — SODIUM CHLORIDE 0.9% FLUSH
3.0000 mL | Freq: Two times a day (BID) | INTRAVENOUS | Status: DC
  Administered 2020-10-01 – 2020-10-03 (×5): 3 mL via INTRAVENOUS

## 2020-10-01 MED ORDER — FAMOTIDINE 20 MG PO TABS
20.0000 mg | ORAL_TABLET | Freq: Every day | ORAL | Status: DC
  Administered 2020-10-01 – 2020-10-03 (×3): 20 mg via ORAL
  Filled 2020-10-01 (×3): qty 1

## 2020-10-01 MED ORDER — INSULIN ASPART 100 UNIT/ML IJ SOLN
0.0000 [IU] | Freq: Every day | INTRAMUSCULAR | Status: DC
  Administered 2020-10-02: 2 [IU] via SUBCUTANEOUS

## 2020-10-01 MED ORDER — ACETAMINOPHEN 325 MG PO TABS: 650.0000 mg | ORAL_TABLET | ORAL | Status: DC | PRN

## 2020-10-01 NOTE — Telephone Encounter (Signed)
Spoke with Mr. Mckimmy via telephone call. States that he missed his home lasix doses on since this past Monday and now has severe volume overload along with N/V, cough, SHOB, and chest tightness. States he took one and a half doses of his home lasix tablets (80mg  each, so total 120mg ) but is still experiencing symptoms. Advised him to come to the ED for further evaluation and management. He confirmed understanding and states will come to [facility].

## 2020-10-01 NOTE — Progress Notes (Signed)
HD#0 SUBJECTIVE:  Patient Summary: Rollie Hynek is a 45 y.o. male with a pertinent PMH of  HTN, HLD, T2DM , R.AKA, OSA not on CPAP, history of multiple DVT/PE on chronic anticoagulation, CKD stage IV, and HFpEF, who presented with SOB, LE swelling, sharp chest pain for 3 days, then admitted for CHF exacerbation.  Overnight Events: None  Patient was seen this AM sitting up in bed alert and pleasant. Reported feeling and breathing much better. Denied pain, CP, abdominal pain. Discussed with patient his lab findings. He had no other concerns.  OBJECTIVE:  Vital Signs: Vitals:   10/02/20 0409 10/02/20 0819 10/02/20 1126 10/02/20 1145  BP: (!) 153/78 140/70  138/72  Pulse: 93 87  88  Resp: 14 18  16   Temp: 98.2 F (36.8 C) 98.1 F (36.7 C)  97.9 F (36.6 C)  TempSrc: Oral Oral  Oral  SpO2: 95% 98% 95% 93%  Weight:      Height:       Supplemental O2: Nasal Cannula SpO2: 93 % O2 Flow Rate (L/min): 2 L/min FiO2 (%): 28 %  Filed Weights   10/01/20 0839 10/02/20 0129  Weight: (!) 152.4 kg (!) 156.3 kg     Intake/Output Summary (Last 24 hours) at 10/02/2020 1515 Last data filed at 10/02/2020 1100 Gross per 24 hour  Intake 960 ml  Output 3675 ml  Net -2715 ml   Net IO Since Admission: -3,615 mL [10/02/20 1515]  Physical Exam: Physical Exam Vitals and nursing note reviewed.  Constitutional:      Appearance: Normal appearance.  HENT:     Head: Normocephalic and atraumatic.  Eyes:     Extraocular Movements: Extraocular movements intact.  Cardiovascular:     Rate and Rhythm: Normal rate and regular rhythm.  Pulmonary:     Effort: No respiratory distress.     Breath sounds: Examination of the right-middle field reveals rales. Examination of the left-middle field reveals rales. Examination of the right-lower field reveals rales. Examination of the left-lower field reveals rales. Rales present. No wheezing.  Musculoskeletal:     Right lower leg: Edema (+1) present.     Left  lower leg: Edema (+1) present.     Comments: R AKA  Skin:    General: Skin is warm and dry.  Neurological:     Mental Status: He is alert and oriented to person, place, and time.    Pertinent Labs: CBC Latest Ref Rng & Units 10/02/2020 10/01/2020 09/28/2020  WBC 4.0 - 10.5 K/uL 10.8(H) 13.2(H) -  Hemoglobin 13.0 - 17.0 g/dL 7.9(L) 8.7(L) 9.0(L)  Hematocrit 39.0 - 52.0 % 25.6(L) 28.4(L) -  Platelets 150 - 400 K/uL 270 250 -    CMP Latest Ref Rng & Units 10/02/2020 10/01/2020 09/28/2020  Glucose 70 - 99 mg/dL 127(H) 135(H) 124(H)  BUN 6 - 20 mg/dL 66(H) 59(H) 69(H)  Creatinine 0.61 - 1.24 mg/dL 3.47(H) 3.14(H) 3.00(H)  Sodium 135 - 145 mmol/L 136 138 135  Potassium 3.5 - 5.1 mmol/L 4.0 4.2 3.9  Chloride 98 - 111 mmol/L 108 110 106  CO2 22 - 32 mmol/L 20(L) 17(L) 19(L)  Calcium 8.9 - 10.3 mg/dL 8.9 9.2 9.0  Total Protein 6.5 - 8.1 g/dL 7.0 7.1 -  Total Bilirubin 0.3 - 1.2 mg/dL 1.0 1.2 -  Alkaline Phos 38 - 126 U/L 65 67 -  AST 15 - 41 U/L 12(L) 13(L) -  ALT 0 - 44 U/L 14 13 -    Recent Labs  10/02/20 0450 10/02/20 0559 10/02/20 1117  GLUCAP 130* 144* 167*     Pertinent Imaging: ECHOCARDIOGRAM COMPLETE  Result Date: 10/01/2020    ECHOCARDIOGRAM REPORT   Patient Name:   HOSIE SHARMAN Date of Exam: 10/01/2020 Medical Rec #:  [id number]     Height:       74.0 in Accession #:    [id number]    Weight:       336.0 lb Date of Birth:  04-27-1975     BSA:          2.710 m Patient Age:    39 years      BP:           127/75 mmHg Patient Gender: M             HR:           91 bpm. Exam Location:  Inpatient Procedure: 2D Echo, Cardiac Doppler and Color Doppler Indications:    Dyspnea  History:        Patient has prior history of Echocardiogram examinations, most                 recent 02/04/2018. Signs/Symptoms:Shortness of Breath; Risk                 Factors:Morbid obesity. Didn't take Lasix for many days.  Sonographer:    Merrie Roof RDCS Referring Phys: [place]  1.  Left ventricular ejection fraction, by estimation, is 60 to 65%. The left ventricle has normal function. The left ventricle has no regional wall motion abnormalities. There is mild concentric left ventricular hypertrophy. Left ventricular diastolic parameters are indeterminate.  2. Right ventricular systolic function is normal. The right ventricular size is normal.  3. Left atrial size was mildly dilated.  4. A small pericardial effusion is present.  5. The mitral valve is normal in structure. Trivial mitral valve regurgitation. No evidence of mitral stenosis.  6. The aortic valve is grossly normal. Aortic valve regurgitation is not visualized. No aortic stenosis is present.  7. The inferior vena cava is normal in size with greater than 50% respiratory variability, suggesting right atrial pressure of 3 mmHg. Comparison(s): No significant change from prior study. FINDINGS  Left Ventricle: Left ventricular ejection fraction, by estimation, is 60 to 65%. The left ventricle has normal function. The left ventricle has no regional wall motion abnormalities. The left ventricular internal cavity size was normal in size. There is  mild concentric left ventricular hypertrophy. Left ventricular diastolic parameters are indeterminate. Right Ventricle: The right ventricular size is normal. No increase in right ventricular wall thickness. Right ventricular systolic function is normal. Left Atrium: Left atrial size was mildly dilated. Right Atrium: Right atrial size was normal in size. Pericardium: A small pericardial effusion is present. Mitral Valve: The mitral valve is normal in structure. Trivial mitral valve regurgitation. No evidence of mitral valve stenosis. Tricuspid Valve: The tricuspid valve is normal in structure. Tricuspid valve regurgitation is trivial. No evidence of tricuspid stenosis. Aortic Valve: The aortic valve is grossly normal. Aortic valve regurgitation is not visualized. No aortic stenosis is present.  Aortic valve mean gradient measures 5.0 mmHg. Aortic valve peak gradient measures 8.3 mmHg. Aortic valve area, by VTI measures 2.79 cm. Pulmonic Valve: The pulmonic valve was grossly normal. Pulmonic valve regurgitation is trivial. No evidence of pulmonic stenosis. Aorta: The aortic root and ascending aorta are structurally normal, with no evidence of dilitation. Venous: The inferior vena cava is  normal in size with greater than 50% respiratory variability, suggesting right atrial pressure of 3 mmHg. IAS/Shunts: The atrial septum is grossly normal.  LEFT VENTRICLE PLAX 2D LVIDd:         5.30 cm LVIDs:         3.30 cm LV PW:         1.30 cm LV IVS:        1.30 cm LVOT diam:     2.20 cm LV SV:         72 LV SV Index:   27 LVOT Area:     3.80 cm  RIGHT VENTRICLE RV Basal diam:  4.00 cm LEFT ATRIUM             Index       RIGHT ATRIUM           Index LA diam:        5.60 cm 2.07 cm/m  RA Area:     15.40 cm LA Vol (A2C):   72.9 ml 26.90 ml/m RA Volume:   34.30 ml  12.66 ml/m LA Vol (A4C):   87.1 ml 32.14 ml/m LA Biplane Vol: 82.5 ml 30.45 ml/m  AORTIC VALVE AV Area (Vmax):    2.67 cm AV Area (Vmean):   2.65 cm AV Area (VTI):     2.79 cm AV Vmax:           144.00 cm/s AV Vmean:          97.400 cm/s AV VTI:            0.259 m AV Peak Grad:      8.3 mmHg AV Mean Grad:      5.0 mmHg LVOT Vmax:         101.00 cm/s LVOT Vmean:        68.000 cm/s LVOT VTI:          0.190 m LVOT/AV VTI ratio: 0.73  AORTA Ao Root diam: 3.20 cm Ao Asc diam:  2.90 cm  SHUNTS Systemic VTI:  0.19 m Systemic Diam: 2.20 cm Buford Dresser MD Electronically signed by Buford Dresser MD Signature Date/Time: 10/01/2020/11:02:21 PM    Final    VAS [place] LOWER EXTREMITY VENOUS (DVT)  Result Date: 10/02/2020  Lower Venous DVT Study Patient Name:  JLYNN LANGILLE  Date of Exam:   10/02/2020 Medical Rec #: [id number]      Accession #:    [id number] Date of Birth: 27-Mar-1975      Patient Gender: M Patient Age:   63 years Exam Location:  [facility] Procedure:      VAS [place] LOWER EXTREMITY VENOUS (DVT) Referring Phys: NISCHAL NARENDRA --------------------------------------------------------------------------------  Indications: Edema.  Limitations: Poor ultrasound/tissue interface, body habitus and restricted mobility. Comparison Study: 11/28/2019- negative lower extremity venous duplex Performing Technologist: Maudry Mayhew MHA, RDMS, RVT, RDCS  Examination Guidelines: A complete evaluation includes B-mode imaging, spectral Doppler, color Doppler, and power Doppler as needed of all accessible portions of each vessel. Bilateral testing is considered an integral part of a complete examination. Limited examinations for reoccurring indications may be performed as noted. The reflux portion of the exam is performed with the patient in reverse Trendelenburg.  Right Technical Findings: Not visualized segments include CFV.  +---------+---------------+---------+-----------+----------+--------------+ LEFT     CompressibilityPhasicitySpontaneityPropertiesThrombus Aging +---------+---------------+---------+-----------+----------+--------------+ CFV      Full           Yes      Yes                                 +---------+---------------+---------+-----------+----------+--------------+  SFJ      Full                                                        +---------+---------------+---------+-----------+----------+--------------+ FV Prox  Full                                                        +---------+---------------+---------+-----------+----------+--------------+ FV Mid   Full                                                        +---------+---------------+---------+-----------+----------+--------------+ FV DistalFull                                                        +---------+---------------+---------+-----------+----------+--------------+ PFV      Full                                                         +---------+---------------+---------+-----------+----------+--------------+ POP      Full           Yes      Yes                                 +---------+---------------+---------+-----------+----------+--------------+ PTV      Full                                                        +---------+---------------+---------+-----------+----------+--------------+ PERO     Full                                                        +---------+---------------+---------+-----------+----------+--------------+     Summary: LEFT: - There is no evidence of deep vein thrombosis in the lower extremity.  - No cystic structure found in the popliteal fossa.  *See table(s) above for measurements and observations.    Preliminary     ASSESSMENT/PLAN:  Assessment: Active Problems:   Heart failure (HCC)   Acute on chronic heart failure (HCC)  Brodan Dase is a 45 y.o. male with a pertinent PMH of  HTN, HLD, T2DM , R.AKA, OSA not on CPAP, history of multiple DVT/PE on chronic anticoagulation, CKD stage IV, and HFpEF, who presented with SOB, LE swelling, sharp chest pain for 3 days, then admitted  for CHF exacerbation. HD#0  Plan: #Acute on Chronic HFpEF Echo shows EF 60-65% with no regional wall motion, no significant valve abnormalities, LVH, and pericardial effusion. Currently on 2 L nasal cannula. Receive 160 mg total of IV Lasix 9/23. Adequate urine output 3,944mL today. Given patient is trying to actively lose weight he has not had a dry weight. Cr 3.47, up from 3.14. On physical exam still fluid overloaded, we will continue IV Lasix. -Lasix 80 mg BID IV -Strict ins and outs, daily weights   #Type 2 diabetes mellitus Hemoglobin A1c 5.5 on 07/08/2020. Home regimen includes Victoza, Tresiba 49 units, NovoLog TID 15 units, 25 units, 30 units, although he sometimes does not take morning dose of Novolog if he is working out.  Currently fasting glucose is about 140s. -Levemir 25  units, SSI -moderate, QHS correction 0-5 units -Trend CBGs   #History of multiple DVT/PE on chronic anticoagulation -DVT/PE in 2004, chart review shows patient reported DVTs in 2006 and 2011 -Continue apixaban 5 mg twice daily.   #OSA not on CPAP Mild obstructive sleep apnea but has not been able to obtain CPAP. Will need outpatient follow up . - CPAP QHS   #CKD Stage IV -Patient reports no plan for vascular access for possible HD at this time.  He is followed by Dr. Joelyn Oms at [facility].  He has been recommended to continue weight loss goal and to get below BMI 35. -Continue monitor kidney function.    #HTN - Continue home regimen: Coreg 25 twice daily, hydralazine 100 mg 3 times daily and amlodipine 10 mg daily  Best Practice: Diet: Carb-Modified IVF: None,None VTE: Eliquis 5 mg BID Code: Full Code  PT/OT recs: Pending, none. TOC recs: Pending  Prior to Admission Living Arrangement: Home, lives alone Anticipated Discharge Location: Home Barriers to Discharge: Treatment of heart failure exacerbation  Dispo: Anticipated discharge in 1-2 days.  Signature: Merrily Brittle, D.O. Psychiatry Resident, PGY-1 [facility] Teaching Service Pager: [phone] 3:15 PM, 10/02/2020   Please contact the on call pager after 5 pm and on weekends at [phone].

## 2020-10-01 NOTE — Discharge Summary (Signed)
Name: [name] MRN: [id number] DOB: 1975-05-18 45 y.o. PCP: [name], MD  Date of Admission: 10/01/2020  4:02 AM Date of Discharge: 10/03/2020 Attending Physician: Dr. 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 on Eliquis 5 mg BID   Discharge Medications: Allergies as of 10/03/2020       Reactions   Reglan [metoclopramide] Other (See Comments)   Dysphoric reaction   Metformin And Related Diarrhea   Ozempic (0.25 Or 0.5 Mg-dose) [semaglutide(0.25 Or 0.19m-dos)] Nausea And Vomiting        Medication List     TAKE these medications    Accu-Chek Guide Me w/Device Kit Use to check blood sugar up to 3 times a day What changed:  how much to take how to take this when to take this additional instructions   Accu-Chek Guide test strip Generic drug: glucose blood Check blood sugar up to 3 times a day What changed:  how much to take how to take this when to take this additional instructions   accu-chek softclix lancets Use to check blood sugars 5 times a day. Dx code:E11.9. Insulin dependent. What changed:  how much to take how to take this when to take this   Accu-Chek Softclix Lancets lancets Check blood sugar up to 3 times a day What changed:  how much to take how to take this when to take this additional instructions   acidophilus Caps capsule Take 1 capsule by mouth daily.   Alpha-Lipoic Acid 600 MG Caps Take 600 mg by mouth daily.   amLODipine 10 MG tablet Commonly known as:  NORVASC Take 10 mg by mouth at bedtime.   apixaban 5 MG Tabs tablet Commonly known as: Eliquis Take 1 tablet (5 mg total) by mouth 2 (two) times daily.   B-12 500 MCG Subl Place 500 mcg under the tongue daily.   B-D UF III MINI PEN NEEDLES 31G X 5 MM Misc Generic drug: Insulin Pen Needle 1 each by Other route See admin instructions. USE 4-5 TIMES DAILY   Pen Needles 3/16" 31G X 5 MM Misc Use five times daily   carvedilol 25 MG tablet Commonly known as: COREG Take 1 tablet (25 mg total) by mouth 2 (two) times daily with a meal.   cyclobenzaprine 5 MG tablet Commonly known as: FLEXERIL Take 1 tablet (5 mg total) by mouth 3 (three) times daily as needed for muscle spasms.   famotidine 20 MG tablet Commonly known as: PEPCID Take 1 tablet (20 mg total) by mouth 2 (two) times daily. What changed:  when to take this reasons to take this   fluticasone 50 MCG/ACT nasal spray Commonly known as: Flonase Place 2 sprays into both nostrils daily. What changed:  when to take this reasons to take this   furosemide 80 MG tablet Commonly known as: LASIX Take 1 tablet by mouth twice daily What changed: when to take this   gabapentin 100 MG capsule Commonly known as: NEURONTIN TAKE 1 CAPSULE BY MOUTH THREE TIMES DAILY   hydrALAZINE  100 MG tablet Commonly known as: APRESOLINE Take 100 mg by mouth 3 (three) times daily. What changed: Another medication with the same name was removed. Continue taking this medication, and follow the directions you see here.   insulin aspart 100 UNIT/ML FlexPen Commonly known as: NOVOLOG Administer 5 units in the morning with breakfast, 20 units with lunch, and 25 units with dinner daily.  ICD Code:  E11.9 What changed:  how much to take how to take this when to take this additional instructions   lovastatin 20 MG tablet Commonly known as: MEVACOR Take 1 tablet (20 mg total) by mouth daily.   oxybutynin 5 MG tablet Commonly known as:  DITROPAN Take 1 tablet (5 mg total) by mouth 3 (three) times daily.   polyethylene glycol 17 g packet Commonly known as: MIRALAX / GLYCOLAX Take 17 g by mouth 2 (two) times daily. What changed:  when to take this reasons to take this   potassium chloride SA 20 MEQ tablet Commonly known as: KLOR-CON Take 20 mEq by mouth 2 (two) times daily.   senna-docusate 8.6-50 MG tablet Commonly known as: Senokot-S Take 1 tablet by mouth daily. What changed:  when to take this reasons to take this   sildenafil 50 MG tablet Commonly known as: VIAGRA Take 1-2 tablets (50-100 mg total) by mouth as needed for erectile dysfunction. Take 1 hour before needed. Do not take more than 2 tablets at once.   tamsulosin 0.4 MG Caps capsule Commonly known as: FLOMAX Take 0.4 mg by mouth at bedtime.   traZODone 50 MG tablet Commonly known as: DESYREL Take 1 tablet (50 mg total) by mouth at bedtime.   Tyler Aas FlexTouch 200 UNIT/ML FlexTouch Pen Generic drug: insulin degludec INJECT 45 UNITS SUBCUTANEOUSLY ONCE DAILY What changed:  how much to take how to take this when to take this additional instructions   Victoza 18 MG/3ML Sopn Generic drug: liraglutide INJECT 1.2 MG INTO THE SKIN ONCE DAILY AT THE SAME TIME What changed: See the new instructions.               Durable Medical Equipment  (From admission, onward)           Start     Ordered   10/03/20 1454  For home use only DME oxygen  Once       Question Answer Comment  Length of Need 12 Months   Mode or (Route) Nasal cannula   Liters per Minute 2   Frequency Continuous (stationary and portable oxygen unit needed)   Oxygen delivery system Gas      10/03/20 1453            Disposition and follow-up:   Mr.Cordero Tozzi was discharged from [facility] in Stable condition.  At the hospital follow up visit please address:  CHF exacerbation  Patient was IV diuresed for 2 days, while here required 4 L  O2 nasal cannula.  O2 sat 78% at rest.  Please assess again if patient is stable at room air with activity. OSA not on CPAP. Last sleep study and 10/29/2019.  Patient request a repeat sleep study as he has worked hard to lose weight, and will need a CPAP if confirmed OSA. CKD, type 2 diabetes, hyperlipidemia, chronic follow-up. Labs / imaging needed at time of follow-up: CBC, CMP, sleep study, 6-minute walk test Pending labs/ test needing follow-up: None  Follow-up Appointments:  Follow-up Information     [name], MD. Go in  1 week(s).   Specialty: Internal Medicine Contact information: [address] [place] [address] [phone]         Rexene Agent, [place] in 1 week(s).   Specialty: Nephrology Contact information: [place] [address] [phone]         Sueanne Margarita, MD. Go in 1 week(s).   Specialty: Cardiology Why: Obtaining CPAP, may need repeat sleep study. Contact information: [address] [place] [address] [facility] Follow up.   Why: home 02 arranged- they will deliver transport tank to room prior to discharge. Contact information: [place] [address] [place] by problem list: Aleck Locklin is a 45 y.o. male with a pertinent PMH of  HTN, HLD, T2DM , R.AKA, OSA not on CPAP, history of multiple DVT/PE on chronic anticoagulation, CKD stage IV, and HFpEF (EF 60 to 65% , 10/02/2020), who presented with SOB, LE swelling, sharp chest pain for 3 days, then admitted for CHF exacerbation.  Length of hospital stay: 2 days  In the ED, required 33 minutes of critical care for respiratory failure in the setting of CHF exacerbation.  Also received 240 mg IV Lasix.  During hospitalization, patient was diuresed with IV Lasix twice daily, with good urine output.  He required 4 L nasal cannula, O2 sat 78% at  rest.  Consults: None  #Acute on Chronic HFpEF 10/01/2020 echo showed EF 60 to 65%, below for more details. Patient reports exercising at the gym 6 days a week and normally activity is not limited, by estimation NYHA class I symptoms. Patient use wheelchair for mobility.  He is prescribed Coreg and PO Lasix 80 mg BID and potassium 20 meq BID.  Admission serum creatinine 3.14 ( baseline Cr ~3.3 to 3.4) and potassium 4.2. Labs workup shows BNP 304 down from 606 one month ago during admission for acute on chronic heart failure admission.   -Lasix 80 mg IV twice daily -Require 4 L supplemental O2 nasal cannula -Strict ins and outs, daily weights.  Given patient is trying to actively lose weight he has not had a dry weight.   #Type 2 diabetes mellitus Hemoglobin A1c 4.7, 10/02/2020.  Home regimen includes Victoza, Tresiba 49 units, NovoLog TID 15 units, 25 units, 30 units, although he sometimes does not take morning dose of Novolog if he is working out.  -Levemir 25 units, SSI -moderate, QHS correction 0-5 units - Trend CBGs   #History of multiple DVT/PE on chronic anticoagulation -DVT/PE in 2004, chart review shows patient reported DVTs in 2006 and 2011 -Continue apixaban 5 mg twice daily.   #OSA not on CPAP Mild obstructive sleep apnea but has not been able to obtain CPAP. Will need outpatient follow up.  Patient has refused CPAP while hospitalized. - CPAP QHS   #CKD Stage IV  -Patient reports no plan for vascular access for possible HD at this time.  He is followed by Dr. Joelyn Oms at [facility].  He has been recommended to continue weight loss goal and to get below BMI 35. -Continue monitor kidney function.    #HTN - Continue home regimen: Coreg 25 twice daily, hydralazine 100 mg 3 times daily and amlodipine 10 mg daily  Discharge Exam:   BP (!) 149/83   Pulse 88  Temp 98 F (36.7 C) (Oral)   Resp 18   Ht 6' 2"  (1.88 m)   Wt (!) 344 lb 2.2 oz (156.1 kg)   SpO2 92%   BMI 44.18  kg/m   Physical Exam Vitals and nursing note reviewed.  Constitutional:      General: He is not in acute distress.    Appearance: He is not ill-appearing, toxic-appearing or diaphoretic.  HENT:     Head: Normocephalic and atraumatic.  Eyes:     Extraocular Movements: Extraocular movements intact.  Cardiovascular:     Rate and Rhythm: Normal rate and regular rhythm.     Heart sounds: Normal heart sounds.  Musculoskeletal:     Cervical back: Normal range of motion.  Neurological:     Mental Status: He is alert.     Pertinent Labs, Studies, and Procedures:  DG Chest Port 1 View  Result Date: 10/01/2020 CLINICAL DATA:  45 year old male with chest pain, ran out of Lasix. EXAM: PORTABLE CHEST 1 VIEW COMPARISON:  Portable chest 08/19/2020 and earlier. FINDINGS: Portable AP upright view at 0531 hours. Stable low lung volumes and cardiomegaly. Visualized tracheal air column is within normal limits. Basilar predominant and mildly asymmetric pulmonary vascular congestion and interstitial opacity, improved compared to August. No pneumothorax, pleural effusion or consolidation. No acute osseous abnormality identified. IMPRESSION: Low lung volumes with pulmonary edema, less severe compared to that in August. Electronically Signed   By: Genevie Ann M.D.   On: 10/01/2020 06:09   ECHOCARDIOGRAM COMPLETE  Result Date: 10/01/2020    ECHOCARDIOGRAM REPORT   Patient Name:   [name] Date of Exam: 10/01/2020 Medical Rec #:  [id number]     Height:       74.0 in Accession #:    [id number]    Weight:       336.0 lb Date of Birth:  07/27/1975     BSA:          2.710 m Patient Age:    51 years      BP:           127/75 mmHg Patient Gender: M             HR:           91 bpm. Exam Location:  Inpatient Procedure: 2D Echo, Cardiac Doppler and Color Doppler Indications:    Dyspnea  History:        Patient has prior history of Echocardiogram examinations, most                 recent 02/04/2018. Signs/Symptoms:Shortness of  Breath; Risk                 Factors:Morbid obesity. Didn't take Lasix for many days.  Sonographer:    Merrie Roof RDCS Referring Phys: [place]  1. Left ventricular ejection fraction, by estimation, is 60 to 65%. The left ventricle has normal function. The left ventricle has no regional wall motion abnormalities. There is mild concentric left ventricular hypertrophy. Left ventricular diastolic parameters are indeterminate.  2. Right ventricular systolic function is normal. The right ventricular size is normal.  3. Left atrial size was mildly dilated.  4. A small pericardial effusion is present.  5. The mitral valve is normal in structure. Trivial mitral valve regurgitation. No evidence of mitral stenosis.  6. The aortic valve is grossly normal. Aortic valve regurgitation is not visualized. No aortic stenosis is present.  7. The inferior vena  cava is normal in size with greater than 50% respiratory variability, suggesting right atrial pressure of 3 mmHg. Comparison(s): No significant change from prior study. FINDINGS  Left Ventricle: Left ventricular ejection fraction, by estimation, is 60 to 65%. The left ventricle has normal function. The left ventricle has no regional wall motion abnormalities. The left ventricular internal cavity size was normal in size. There is  mild concentric left ventricular hypertrophy. Left ventricular diastolic parameters are indeterminate. Right Ventricle: The right ventricular size is normal. No increase in right ventricular wall thickness. Right ventricular systolic function is normal. Left Atrium: Left atrial size was mildly dilated. Right Atrium: Right atrial size was normal in size. Pericardium: A small pericardial effusion is present. Mitral Valve: The mitral valve is normal in structure. Trivial mitral valve regurgitation. No evidence of mitral valve stenosis. Tricuspid Valve: The tricuspid valve is normal in structure. Tricuspid valve regurgitation is  trivial. No evidence of tricuspid stenosis. Aortic Valve: The aortic valve is grossly normal. Aortic valve regurgitation is not visualized. No aortic stenosis is present. Aortic valve mean gradient measures 5.0 mmHg. Aortic valve peak gradient measures 8.3 mmHg. Aortic valve area, by VTI measures 2.79 cm. Pulmonic Valve: The pulmonic valve was grossly normal. Pulmonic valve regurgitation is trivial. No evidence of pulmonic stenosis. Aorta: The aortic root and ascending aorta are structurally normal, with no evidence of dilitation. Venous: The inferior vena cava is normal in size with greater than 50% respiratory variability, suggesting right atrial pressure of 3 mmHg. IAS/Shunts: The atrial septum is grossly normal.  LEFT VENTRICLE PLAX 2D LVIDd:         5.30 cm LVIDs:         3.30 cm LV PW:         1.30 cm LV IVS:        1.30 cm LVOT diam:     2.20 cm LV SV:         72 LV SV Index:   27 LVOT Area:     3.80 cm  RIGHT VENTRICLE RV Basal diam:  4.00 cm LEFT ATRIUM             Index       RIGHT ATRIUM           Index LA diam:        5.60 cm 2.07 cm/m  RA Area:     15.40 cm LA Vol (A2C):   72.9 ml 26.90 ml/m RA Volume:   34.30 ml  12.66 ml/m LA Vol (A4C):   87.1 ml 32.14 ml/m LA Biplane Vol: 82.5 ml 30.45 ml/m  AORTIC VALVE AV Area (Vmax):    2.67 cm AV Area (Vmean):   2.65 cm AV Area (VTI):     2.79 cm AV Vmax:           144.00 cm/s AV Vmean:          97.400 cm/s AV VTI:            0.259 m AV Peak Grad:      8.3 mmHg AV Mean Grad:      5.0 mmHg LVOT Vmax:         101.00 cm/s LVOT Vmean:        68.000 cm/s LVOT VTI:          0.190 m LVOT/AV VTI ratio: 0.73  AORTA Ao Root diam: 3.20 cm Ao Asc diam:  2.90 cm  SHUNTS Systemic VTI:  0.19 m Systemic Diam: 2.20 cm  Buford Dresser MD Electronically signed by Buford Dresser MD Signature Date/Time: 10/01/2020/11:02:21 PM    Final    VAS [place] LOWER EXTREMITY VENOUS (DVT)  Result Date: 10/02/2020  Lower Venous DVT Study Patient Name:  ZAIR BORAWSKI  Date  of Exam:   10/02/2020 Medical Rec #: [id number]      Accession #:    [id number] Date of Birth: [birth year]      Patient Gender: M Patient Age:   75 years Exam Location[facility]  [facility] Procedure:      VAS [place] LOWER EXTREMITY VENOUS (DVT) Referring Phys: NISCHAL NARENDRA --------------------------------------------------------------------------------  Indications: Edema.  Limitations: Poor ultrasound/tissue interface, body habitus and restricted mobility. Comparison Study: 11/28/2019- negative lower extremity venous duplex Performing Technologist: Maudry Mayhew MHA, RDMS, RVT, RDCS  Examination Guidelines: A complete evaluation includes B-mode imaging, spectral Doppler, color Doppler, and power Doppler as needed of all accessible portions of each vessel. Bilateral testing is considered an integral part of a complete examination. Limited examinations for reoccurring indications may be performed as noted. The reflux portion of the exam is performed with the patient in reverse Trendelenburg.  Right Technical Findings: Not visualized segments include CFV.  +---------+---------------+---------+-----------+----------+--------------+ LEFT     CompressibilityPhasicitySpontaneityPropertiesThrombus Aging +---------+---------------+---------+-----------+----------+--------------+ CFV      Full           Yes      Yes                                 +---------+---------------+---------+-----------+----------+--------------+ SFJ      Full                                                        +---------+---------------+---------+-----------+----------+--------------+ FV Prox  Full                                                        +---------+---------------+---------+-----------+----------+--------------+ FV Mid   Full                                                        +---------+---------------+---------+-----------+----------+--------------+ FV DistalFull                                                         +---------+---------------+---------+-----------+----------+--------------+ PFV      Full                                                        +---------+---------------+---------+-----------+----------+--------------+ POP      Full           Yes      Yes                                 +---------+---------------+---------+-----------+----------+--------------+  PTV      Full                                                        +---------+---------------+---------+-----------+----------+--------------+ PERO     Full                                                        +---------+---------------+---------+-----------+----------+--------------+     Summary: LEFT: - There is no evidence of deep vein thrombosis in the lower extremity.  - No cystic structure found in the popliteal fossa.  *See table(s) above for measurements and observations. Electronically signed by Servando Snare MD on 10/02/2020 at 5:23:22 PM.    Final      Discharge Instructions:   Discharge Instructions      Dear Mr. Forti, I am so glad you are feeling better and can be discharged today! You were admitted because of CHF exacerbation by accidentally forgetting to take her Lasix.   Please see the following instructions: For your: CHF Continue taking your home meds as you are prior. Currently you require 4 L of oxygen via nasal cannula, please continue to use this until reassessed. The risk of not enough oxygen, is increased blood pressure which will further weaken your heart, worsen kidney disease, amongst others. Sleep apnea I encourage you to get a sleep study or CPAP this week.  The importance is the same as above, with decreased oxygen. Please continue taking all of your other home meds, as you are before. Rooting for you as you are working hard to lose weight.  You got this!  It was a pleasure meeting you, Mr. Mccreadie.  I wish you and your family the best, and  hope you stay happy and healthy!  Merrily Brittle, DO Psychiatry PGY-1 [facility], IMTS    Signed: Merrily Brittle, DO Psychiatry Resident, PGY-1 [facility] Teaching Service 10/03/2020, 5:41 PM   Pager: [phone]

## 2020-10-01 NOTE — ED Notes (Signed)
This RN assisted pt onto a hospital bed. Pt was also requesting to be cleaned up d/t a split urinal. This RN helped pt wash up and switch to the hospital bed. Will continue to monitor.

## 2020-10-01 NOTE — H&P (Addendum)
Date: 10/01/2020               Patient Name:  [name] MRN: [id number]  DOB: 11-Aug-1975 Age / Sex: 45 y.o., male   PCP: [name], MD         Medical Service: Internal Medicine Teaching Service         Attending Physician: Dr. Sid Falcon, MD    First Contact: Horatio Pel, DO Pager:  [phone]  Second Contact: Hadassah Pais, MD Pager: [phone]       After Hours (After 5p/  First Contact Pager: [phone]  weekends / holidays): Second Contact Pager: [phone]   SUBJECTIVE   Chief Complaint: SOB, sharp chest pain  History of Present Illness: [name] is a 45 year old male living with HTN, HLD, T2DM , R.AKA, OSA not on CPAP, history of multiple DVT/PE on chronic anticoagulation, CKD stage IV, and HFpEF who presents with SOB and sharp chest pain. Patients chest pain was resolved on my interview. He was not experiencing shortness of breath at rest, but says it comes on with minimal activity.  The shortness of breath began earlier this week and progressively got worse. He started sharp chest last night and called the on call number for [facility] He was instructed to go to the ED. He noticed yesterday he had left Lasix out of his pill bottle.  He missed 4 days of his Lasix 80 mg twice daily. He also has noticed swelling in his left leg, he has a right AKA.   Review of Systems  Constitutional:  Positive for weight loss (actively trying to lose weight). Negative for chills and fever.  Eyes:  Negative for blurred vision and double vision.  Respiratory:  Positive for shortness of breath. Negative for wheezing.   Cardiovascular:  Positive for orthopnea and leg swelling. Negative for chest pain.  Gastrointestinal:  Negative for abdominal pain and blood in stool.  Genitourinary:  Negative for flank pain and hematuria.  Musculoskeletal:  Negative for falls and myalgias.  Neurological:  Negative for dizziness and headaches.  Psychiatric/Behavioral:  Negative for memory loss and  substance abuse.    Meds:  No outpatient medications have been marked as taking for the 10/01/20 encounter [facility] Encounter).    Past Medical History:  Diagnosis Date   Acute venous embolism and thrombosis of deep vessels of proximal lower extremity (HCC) 07/19/2011   Anemia    Anginal pain (HCC)    pt denies   Anxiety    Chest pain, neg MI, normal coronaries by cath 02/18/2013   pt denies   CHF (congestive heart failure) (HCC)    CKD (chronic kidney disease) stage 3, GFR 30-59 ml/min ([place]) 02/19/2013   Colon polyps    Depression    Diabetic ulcer of right foot ([place])    DVT (deep venous thrombosis) ([place]) 09/2002   patient reports additional DVTs in '06 & '11 (unconfirmed)   ED (erectile dysfunction)    GERD (gastroesophageal reflux disease)    History of blood transfusion    "related to OR" (10/31/2016)   Hyperlipidemia 02/19/2013   Hypertension    Nausea & vomiting    "constant for the last couple weeks" (10/31/2016)   Nephrotic syndrome    Obesity    BMI 44, weight 346 pounds 01/30/14   OSA (obstructive sleep apnea) 02/28/2018   Mild obstructive sleep apnea with an AHI of 9.8/h but severe during REM sleep with an AHI of 33.8/h.  Oxygen saturations dropped to 86%  and there was moderate snoring   Peripheral edema    Pneumonia    Prosthesis adjustment 08/17/2016   Pulmonary embolism ([place]) 09/2002   treated with 6 months of warfarin   Pyelonephritis 02/02/2018   Type I diabetes mellitus ([place]) dx'd 2001   Urinary retention     Past Surgical History:  Procedure Laterality Date   AMPUTATION Right 12/22/2013   Procedure: AMPUTATION BELOW KNEE;  Surgeon: [name], MD;  Location: [place];  Service: Orthopedics;  Laterality: Right;   AMPUTATION Right 02/19/2014   Procedure: RIGHT ABOVE KNEE AMPUTATION ;  Surgeon: [name], MD;  Location: [place];  Service: Orthopedics;  Laterality: Right;   blood clot removal  2016   CARDIAC CATHETERIZATION  02/18/2013   normal coronaries    ESOPHAGOGASTRODUODENOSCOPY N/A 11/02/2016   Procedure: ESOPHAGOGASTRODUODENOSCOPY (EGD);  Surgeon: [name], MD;  Location: [facility] ENDOSCOPY;  Service: Endoscopy;  Laterality: N/A;   I & D EXTREMITY Right 12/14/2013   Procedure: IRRIGATION AND DEBRIDEMENT RIGHT FOOT;  Surgeon: [name], MD;  Location: [place];  Service: Orthopedics;  Laterality: Right;   INCISION AND DRAINAGE ABSCESS  2007; 2015   "back"   INCISION AND DRAINAGE OF WOUND Right 12/22/2013   Procedure: I&D RIGHT BUTTOCK;  Surgeon: [name], MD;  Location: [place];  Service: Orthopedics;  Laterality: Right;   LEFT HEART CATHETERIZATION WITH CORONARY ANGIOGRAM N/A 02/18/2013   Procedure: LEFT HEART CATHETERIZATION WITH CORONARY ANGIOGRAM;  Surgeon: [name], MD;  Location: [facility] CATH LAB;  Service: Cardiovascular;  Laterality: N/A;    Social:  Lives by himself.  He has 3 brothers and 3 sisters, and a girlfriend who make up a support system.  He is a Charity fundraiser at [employer].  Has a nursing aide who helps him with ADLs. PCP: [facility], Mayer Masker, MD Substances: Patient drinks alcohol at social events, no tobacco use, no drug use  Family History:  Family History  Problem Relation Age of Onset   Heart disease Mother    Diabetes Mother    Diabetes Maternal Uncle    Diabetes Cousin      Allergies: Allergies as of 10/01/2020 - Review Complete 09/24/2020  Allergen Reaction Noted   Reglan [metoclopramide] Other (See Comments) 11/01/2016   Metformin and related Diarrhea 08/19/2019   Ozempic (0.25 or 0.5 mg-dose) [semaglutide(0.25 or 0.5mg -dos)] Nausea And Vomiting 08/19/2019    OBJECTIVE:   Physical Exam: Blood pressure 133/70, pulse 93, temperature 98.6 F (37 C), resp. rate 19, SpO2 90 %.   General: NAD, obese, wearing nasal canula on 4L  HE: Normocephalic, atraumatic , EOMI, Conjunctivae normal ENT: No congestion, no rhinorrhea, no exudate or erythema  Cardiovascular: Normal rate,  regular rhythm.  No murmurs, rubs, or gallops, JVP 9 to 10 cm Pulmonary : Effort normal, breath sounds normal. Rales at bases, no wheezing Abdominal: soft, nontender,  bowel sounds present Musculoskeletal: no swelling , R.AKA, Left leg with 1 + pitting edema Skin: Warm, dry  Psychiatric/Behavioral:  normal mood, normal behavior  Neuro: Alert and oriented to person place time and situation. No focal weakness   Labs: CBC    Component Value Date/Time   WBC 13.2 (H) 10/01/2020 0429   RBC 3.06 (L) 10/01/2020 0429   HGB 8.7 (L) 10/01/2020 0429   HGB 9.2 (L) 04/24/2018 1040   HCT 28.4 (L) 10/01/2020 0429   HCT 28.4 (L) 04/24/2018 1040   PLT 250 10/01/2020 0429   PLT 359 04/24/2018 1040  MCV 92.8 10/01/2020 0429   MCV 85 04/24/2018 1040   MCH 28.4 10/01/2020 0429   MCHC 30.6 10/01/2020 0429   RDW 14.8 10/01/2020 0429   RDW 14.1 04/24/2018 1040   LYMPHSABS 1.0 08/23/2020 0405   LYMPHSABS 1.7 04/24/2018 1040   MONOABS 0.9 08/23/2020 0405   EOSABS 0.4 08/23/2020 0405   EOSABS 0.3 04/24/2018 1040   BASOSABS 0.1 08/23/2020 0405   BASOSABS 0.0 04/24/2018 1040     CMP     Component Value Date/Time   NA 138 10/01/2020 0429   NA 137 06/03/2019 0948   K 4.2 10/01/2020 0429   CL 110 10/01/2020 0429   CO2 17 (L) 10/01/2020 0429   GLUCOSE 135 (H) 10/01/2020 0429   BUN 59 (H) 10/01/2020 0429   BUN 63 (H) 06/03/2019 0948   CREATININE 3.14 (H) 10/01/2020 0429   CREATININE 1.78 (H) 12/09/2013 1559   CALCIUM 9.2 10/01/2020 0429   CALCIUM 9.0 08/10/2020 0950   PROT 7.1 10/01/2020 0429   ALBUMIN 3.1 (L) 10/01/2020 0429   AST 13 (L) 10/01/2020 0429   ALT 13 10/01/2020 0429   ALKPHOS 67 10/01/2020 0429   BILITOT 1.2 10/01/2020 0429   GFRNONAA 24 (L) 10/01/2020 0429   GFRNONAA 48 (L) 12/09/2013 1559   GFRAA 16 (L) 10/06/2019 1337   GFRAA 55 (L) 12/09/2013 1559    Imaging: DG Chest Port 1 View  Result Date: 10/01/2020 CLINICAL DATA:  45 year old male with chest pain, ran out of  Lasix. EXAM: PORTABLE CHEST 1 VIEW COMPARISON:  Portable chest 08/19/2020 and earlier. FINDINGS: Portable AP upright view at 0531 hours. Stable low lung volumes and cardiomegaly. Visualized tracheal air column is within normal limits. Basilar predominant and mildly asymmetric pulmonary vascular congestion and interstitial opacity, improved compared to August. No pneumothorax, pleural effusion or consolidation. No acute osseous abnormality identified. IMPRESSION: Low lung volumes with pulmonary edema, less severe compared to that in August. Electronically Signed   By: Genevie Ann M.D.   On: 10/01/2020 06:09    EKG: personally reviewed and will repeat given right and left arm electrodes are reversed.    ASSESSMENT & PLAN:    Assessment & Plan by Problem: Active Problems:   Heart failure (HCC)   [name] is a 45 y.o. living with HTN, HLD, T2DM , R.AKA, OSA not on CPAP, history of multiple DVT/PE on chronic anticoagulation, CKD stage IV, and HFpEF who presents with SOB and sharp chest pain.   #Acute on Chronic HFpEF 05/2019 Echo shows EF 60-65% with no regional wall motion , no significant valve abnormalities, and borderline LVH. Patient reports exercising at the gym 6 days a week and normally activity is not limited, by estimation NYHA class I symptoms. Patient use wheelchair for mobility.  He is prescribed Coreg and PO Lasix 80 mg BID and potassium 20 meq BID. Most recent serum creatinine 3.14 ( baseline Cr ~3.3 to 3.4) and potassium 4.2. Labs workup shows BNP 304 down from 606 one month ago during admission for acute on chronic heart failure admission.   -Mildly elevated JVP , left lower extremity edema, along with shortness of breath consistent with heart failure exacerbation. -Repeat EKG as electrodes were reversed, but chest pain is resolved and no ischemic changes appreciated on initial EKG.  HS troponin peaked at 36. - Will repeat given over a year since last echo and second admission in last  few months. Although I expect exacerbation is from incidental medication nonadherence. -Strict ins and outs,  daily weights.  Given patient is trying to actively lose weight he has not had a dry weight.   #Type 2 diabetes mellitus Hemoglobin A1c 5.5 on 07/08/2020.  Home regimen includes Victoza, Tresiba 49 units, NovoLog TID 15 units, 25 units, 30 units, although he sometimes does not take morning dose of Novolog if he is working out.  -Levemir 25 units, SSI -moderate, QHS correction 0-5 units - Trend CBGs  #History of multiple DVT/PE on chronic anticoagulation -DVT/PE in 2004, chart review shows patient reported DVTs in 2006 and 2011 -Continue apixaban 5 mg twice daily.  #OSA not on CPAP Mild obstructive sleep apnea but has not been able to obtain CPAP. Will need outpatient follow up . - CPAP QHS  #CKD Stage IV  -Patient reports no plan for vascular access for possible HD at this time.  He is followed by Dr. Joelyn Oms at [facility].  He has been recommended to continue weight loss goal and to get below BMI 35. -Continue monitor kidney function.   #HTN - Continue home regimen: Coreg 25 twice daily, hydralazine 100 mg 3 times daily and amlodipine 10 mg daily   Diet:  HHCM VTE: NOAC IVF: None,None Code: Full  Prior to Admission Living Arrangement: Home, lives alone Anticipated Discharge Location: Home Barriers to Discharge: Treatment of heart failure exacerbation  Dispo: Admit patient to Observation with expected length of stay less than 2 midnights.  Signed: Drema Pry, MD Internal Medicine Resident PGY-3 Pager: [phone]  10/01/2020, 7:49 AM

## 2020-10-01 NOTE — ED Notes (Signed)
This RN came to check on pt and pt was sating 88-89% with a good waveform. Pt's oxygen had been turned off by admitting d/t pt not wearing oxygen at home. This RN placed pt on 2L [place]. Pt now sating 96%. Will continue to monitor.

## 2020-10-01 NOTE — ED Notes (Signed)
Pt transported to echo 

## 2020-10-01 NOTE — Progress Notes (Signed)
  Echocardiogram 2D Echocardiogram has been performed.  Elmer Ramp 10/01/2020, 5:31 PM

## 2020-10-01 NOTE — ED Triage Notes (Signed)
EMS arrival. Came from home. Mx ran out Sunday. Takes Lasix 80mg  BID. Sxs onset 48hrs ago. Peripheral edama. SOB. 24hrs Sxs onset of chest pain.

## 2020-10-01 NOTE — ED Provider Notes (Signed)
[facility] Emergency Department Provider Note MRN:  [id number]  Arrival date & time: 10/01/20     Chief Complaint   Chest Pain   History of Present Illness   [name] is a 45 y.o. year-old male with a history of CHF, CKD, diabetes presenting to the ED with chief complaint of chest pain.  Patient explains that he made a mistake and forgot to include his Lasix into his daily medicine administration box.  And so he has been without Lasix for 4 days.  He is noticing some increased swelling to his leg, worsening shortness of breath.  Also experiencing some sharp central chest pain which is constant in nature, started yesterday.  Denies any headache or vision change, no abdominal pain, no other complaints.  Review of Systems  A complete 10 system review of systems was obtained and all systems are negative except as noted in the HPI and PMH.   Patient's Health History    Past Medical History:  Diagnosis Date   Acute venous embolism and thrombosis of deep vessels of proximal lower extremity (HCC) 07/19/2011   Anemia    Anginal pain (HCC)    pt denies   Anxiety    Chest pain, neg MI, normal coronaries by cath 02/18/2013   pt denies   CHF (congestive heart failure) (HCC)    CKD (chronic kidney disease) stage 3, GFR 30-59 ml/min (HCC) 02/19/2013   Colon polyps    Depression    Diabetic ulcer of right foot ([place])    DVT (deep venous thrombosis) ([place]) 09/2002   patient reports additional DVTs in '06 & '11 (unconfirmed)   ED (erectile dysfunction)    GERD (gastroesophageal reflux disease)    History of blood transfusion    "related to OR" (10/31/2016)   Hyperlipidemia 02/19/2013   Hypertension    Nausea & vomiting    "constant for the last couple weeks" (10/31/2016)   Nephrotic syndrome    Obesity    BMI 44, weight 346 pounds 01/30/14   OSA (obstructive sleep apnea) 02/28/2018   Mild obstructive sleep apnea with an AHI of 9.8/h but severe during REM sleep  with an AHI of 33.8/h.  Oxygen saturations dropped to 86% and there was moderate snoring   Peripheral edema    Pneumonia    Prosthesis adjustment 08/17/2016   Pulmonary embolism ([place]) 09/2002   treated with 6 months of warfarin   Pyelonephritis 02/02/2018   Type I diabetes mellitus ([place]) dx'd 2001   Urinary retention     Past Surgical History:  Procedure Laterality Date   AMPUTATION Right 12/22/2013   Procedure: AMPUTATION BELOW KNEE;  Surgeon: [name], MD;  Location: [place];  Service: Orthopedics;  Laterality: Right;   AMPUTATION Right 02/19/2014   Procedure: RIGHT ABOVE KNEE AMPUTATION ;  Surgeon: [name], MD;  Location: [place];  Service: Orthopedics;  Laterality: Right;   blood clot removal  2016   CARDIAC CATHETERIZATION  02/18/2013   normal coronaries   ESOPHAGOGASTRODUODENOSCOPY N/A 11/02/2016   Procedure: ESOPHAGOGASTRODUODENOSCOPY (EGD);  Surgeon: [name], MD;  Location: [facility] ENDOSCOPY;  Service: Endoscopy;  Laterality: N/A;   I & D EXTREMITY Right 12/14/2013   Procedure: IRRIGATION AND DEBRIDEMENT RIGHT FOOT;  Surgeon: [name], MD;  Location: [place];  Service: Orthopedics;  Laterality: Right;   INCISION AND DRAINAGE ABSCESS  2007; 2015   "back"   INCISION AND DRAINAGE OF WOUND Right 12/22/2013   Procedure: I&D RIGHT BUTTOCK;  Surgeon: [name], MD;  Location: [place];  Service: Orthopedics;  Laterality: Right;   LEFT HEART CATHETERIZATION WITH CORONARY ANGIOGRAM N/A 02/18/2013   Procedure: LEFT HEART CATHETERIZATION WITH CORONARY ANGIOGRAM;  Surgeon: [name], MD;  Location: [facility] CATH LAB;  Service: Cardiovascular;  Laterality: N/A;    Family History  Problem Relation Age of Onset   Heart disease Mother    Diabetes Mother    Diabetes Maternal Uncle    Diabetes Cousin     Social History   Socioeconomic History   Marital status: Single    Spouse name: Not on file   Number of children: 0   Years of education: 15.5   Highest education level: Not on  file  Occupational History   Occupation: [employer]   Occupation: Lexicographer: [employer]   Tobacco Use   Smoking status: Never   Smokeless tobacco: Never  Vaping Use   Vaping Use: Never used  Substance and Sexual Activity   Alcohol use: Yes    Comment: 10/31/2016 "a few beers q 6 months or so"   Drug use: No   Sexual activity: Yes  Other Topics Concern   Not on file  Social History Narrative   Financial assistance approved for 100% discount at [facility] and has [facility] card per [employer]   09/01/2009.      Lives in [place] with mother and sister.            Social Determinants of Health   Financial Resource Strain: Not on file  Food Insecurity: Not on file  Transportation Needs: Not on file  Physical Activity: Not on file  Stress: Not on file  Social Connections: Not on file  Intimate Partner Violence: Not on file     Physical Exam   Vitals:   10/01/20 0426  BP: (!) 153/71  Pulse: 98  Resp: 18  Temp: 98.6 F (37 C)  SpO2: 98%    CONSTITUTIONAL: Chronically ill-appearing, NAD NEURO:  Alert and oriented x 3, no focal deficits EYES:  eyes equal and reactive ENT/NECK:  no LAD, no JVD CARDIO: Regular rate, well-perfused, normal S1 and S2 PULM:  CTAB no wheezing or rhonchi GI/GU:  normal bowel sounds, non-distended, non-tender MSK/SPINE:  No gross deformities, no edema SKIN:  no rash, atraumatic PSYCH:  Appropriate speech and behavior  *Additional and/or pertinent findings included in MDM below  Diagnostic and Interventional Summary    EKG Interpretation  Date/Time: October 01, 2020 at 4: 19: 58   Ventricular Rate:   98 PR Interval:   197 QRS Duration:  90 QT Interval: 347   QTC Calculation:443   R Axis:     Text Interpretation: Sinus rhythm, normal intervals Confirmed Dr. Gerlene Fee at 5:58 AM       Labs Reviewed  CBC - Abnormal; Notable for the following components:      Result Value   WBC 13.2 (*)    RBC 3.06 (*)    Hemoglobin 8.7  (*)    HCT 28.4 (*)    All other components within normal limits  COMPREHENSIVE METABOLIC PANEL - Abnormal; Notable for the following components:   CO2 17 (*)    Glucose, Bld 135 (*)    BUN 59 (*)    Creatinine, Ser 3.14 (*)    Albumin 3.1 (*)    AST 13 (*)    GFR, Estimated 24 (*)    All other components within normal limits  BRAIN NATRIURETIC  PEPTIDE - Abnormal; Notable for the following components:   B Natriuretic Peptide 304.5 (*)    All other components within normal limits  TROPONIN I (HIGH SENSITIVITY) - Abnormal; Notable for the following components:   Troponin I (High Sensitivity) 30 (*)    All other components within normal limits    DG Chest Port 1 View    (Results Pending)    Medications  furosemide (LASIX) injection 80 mg (has no administration in time range)     Procedures  /  Critical Care .Critical Care Performed by: Maudie Flakes, MD Authorized by: Maudie Flakes, MD   Critical care provider statement:    Critical care time (minutes):  33   Critical care was necessary to treat or prevent imminent or life-threatening deterioration of the following conditions:  Respiratory failure   Critical care was time spent personally by me on the following activities:  Discussions with consultants, evaluation of patient's response to treatment, examination of patient, ordering and performing treatments and interventions, ordering and review of laboratory studies, ordering and review of radiographic studies, pulse oximetry, re-evaluation of patient's condition, obtaining history from patient or surrogate and review of old charts  ED Course and Medical Decision Making  I have reviewed the triage vital signs, the nursing notes, and pertinent available records from the EMR.  Listed above are laboratory and imaging tests that I personally ordered, reviewed, and interpreted and then considered in my medical decision making (see below for details).  Suspicious for pulmonary  edema given the history, x-ray seems consistent with this diagnosis.  Chest pain could be some demand ischemia, awaiting troponin.  EKG is reassuring.  Patient requiring 4 L nasal cannula to maintain saturations, will likely need admission.       Barth Kirks. [place]onia Small, [place] [email or web address]  Final Clinical Impressions(s) / ED Diagnoses     ICD-10-CM   1. Chest pain, unspecified type  R07.9     2. Acute pulmonary edema (HCC)  J81.0     3. Acute respiratory failure with hypoxia (HCC)  J96.01       ED Discharge Orders     None        Discharge Instructions Discussed with and Provided to Patient:   Discharge Instructions   None       Maudie Flakes, MD 10/01/20 [id number]

## 2020-10-01 NOTE — Hospital Course (Addendum)
Admitted 10/01/2020  Allergies: Reglan [metoclopramide], Metformin and related, and Ozempic (0.25 or 0.5 mg-dose) [semaglutide(0.25 or 0.5mg -dos)] Pertinent Hx: T2DM on insulin, CKD stage 4, DVT/PE on chronic anti-coagulation, HTN, OSA  45 y.o. male p/w ***  * ***   Plan: Continue *** Repeat BMP at next visit.   Missed lasix for past 4 days  Leg swelling, SHOB, Sharp chest pain  Requiring 4L [place]  CXR with increased pulm edema BNP 300  Admission for diuresis.

## 2020-10-01 NOTE — Progress Notes (Signed)
Pt. States he does not feel he needs a cpap at this time. Pt. Will remain on his nasal cannula.

## 2020-10-01 NOTE — Plan of Care (Signed)
  Problem: Education: Goal: Knowledge of General Education information will improve Description Including pain rating scale, medication(s)/side effects and non-pharmacologic comfort measures Outcome: Progressing   

## 2020-10-01 NOTE — Progress Notes (Signed)
Heart Failure Navigator Progress Note  Assessed for Heart & Vascular TOC clinic readiness.  Patient does not meet criteria due to CKD IV, SCr remains >3.   Navigator available for reassessment of patient. Completed SDoH, no immediate needs noted. Pt walks with walker, uses [employer] for transportation needs. Pt eyes remained close during interview process, short answers given. Pt on 2LPM O2 per [place].   Pricilla Holm, MSN, RN Heart Failure Nurse Navigator [phone]

## 2020-10-02 ENCOUNTER — Inpatient Hospital Stay (HOSPITAL_COMMUNITY): Payer: Medicare Other

## 2020-10-02 ENCOUNTER — Encounter (HOSPITAL_COMMUNITY): Payer: Self-pay | Admitting: Internal Medicine

## 2020-10-02 DIAGNOSIS — I509 Heart failure, unspecified: Secondary | ICD-10-CM | POA: Diagnosis not present

## 2020-10-02 DIAGNOSIS — Z8249 Family history of ischemic heart disease and other diseases of the circulatory system: Secondary | ICD-10-CM | POA: Diagnosis not present

## 2020-10-02 DIAGNOSIS — Z79899 Other long term (current) drug therapy: Secondary | ICD-10-CM | POA: Diagnosis not present

## 2020-10-02 DIAGNOSIS — I130 Hypertensive heart and chronic kidney disease with heart failure and stage 1 through stage 4 chronic kidney disease, or unspecified chronic kidney disease: Secondary | ICD-10-CM | POA: Diagnosis present

## 2020-10-02 DIAGNOSIS — I5033 Acute on chronic diastolic (congestive) heart failure: Secondary | ICD-10-CM | POA: Diagnosis present

## 2020-10-02 DIAGNOSIS — G4733 Obstructive sleep apnea (adult) (pediatric): Secondary | ICD-10-CM | POA: Diagnosis present

## 2020-10-02 DIAGNOSIS — K219 Gastro-esophageal reflux disease without esophagitis: Secondary | ICD-10-CM | POA: Diagnosis present

## 2020-10-02 DIAGNOSIS — Z86718 Personal history of other venous thrombosis and embolism: Secondary | ICD-10-CM | POA: Diagnosis not present

## 2020-10-02 DIAGNOSIS — Z794 Long term (current) use of insulin: Secondary | ICD-10-CM | POA: Diagnosis not present

## 2020-10-02 DIAGNOSIS — J810 Acute pulmonary edema: Secondary | ICD-10-CM | POA: Diagnosis not present

## 2020-10-02 DIAGNOSIS — R609 Edema, unspecified: Secondary | ICD-10-CM | POA: Diagnosis not present

## 2020-10-02 DIAGNOSIS — Z7901 Long term (current) use of anticoagulants: Secondary | ICD-10-CM | POA: Diagnosis not present

## 2020-10-02 DIAGNOSIS — J9690 Respiratory failure, unspecified, unspecified whether with hypoxia or hypercapnia: Secondary | ICD-10-CM | POA: Diagnosis present

## 2020-10-02 DIAGNOSIS — Z86711 Personal history of pulmonary embolism: Secondary | ICD-10-CM | POA: Diagnosis not present

## 2020-10-02 DIAGNOSIS — Z833 Family history of diabetes mellitus: Secondary | ICD-10-CM | POA: Diagnosis not present

## 2020-10-02 DIAGNOSIS — E1122 Type 2 diabetes mellitus with diabetic chronic kidney disease: Secondary | ICD-10-CM | POA: Diagnosis present

## 2020-10-02 DIAGNOSIS — N184 Chronic kidney disease, stage 4 (severe): Secondary | ICD-10-CM | POA: Diagnosis present

## 2020-10-02 DIAGNOSIS — Z89611 Acquired absence of right leg above knee: Secondary | ICD-10-CM | POA: Diagnosis not present

## 2020-10-02 DIAGNOSIS — Z20822 Contact with and (suspected) exposure to covid-19: Secondary | ICD-10-CM | POA: Diagnosis present

## 2020-10-02 DIAGNOSIS — Z888 Allergy status to other drugs, medicaments and biological substances status: Secondary | ICD-10-CM | POA: Diagnosis not present

## 2020-10-02 DIAGNOSIS — E785 Hyperlipidemia, unspecified: Secondary | ICD-10-CM | POA: Diagnosis present

## 2020-10-02 LAB — COMPREHENSIVE METABOLIC PANEL
ALT: 14 U/L (ref 0–44)
AST: 12 U/L — ABNORMAL LOW (ref 15–41)
Albumin: 2.8 g/dL — ABNORMAL LOW (ref 3.5–5.0)
Alkaline Phosphatase: 65 U/L (ref 38–126)
Anion gap: 8 (ref 5–15)
BUN: 66 mg/dL — ABNORMAL HIGH (ref 6–20)
CO2: 20 mmol/L — ABNORMAL LOW (ref 22–32)
Calcium: 8.9 mg/dL (ref 8.9–10.3)
Chloride: 108 mmol/L (ref 98–111)
Creatinine, Ser: 3.47 mg/dL — ABNORMAL HIGH (ref 0.61–1.24)
GFR, Estimated: 22 mL/min — ABNORMAL LOW (ref >60)
Glucose, Bld: 127 mg/dL — ABNORMAL HIGH (ref 70–99)
Potassium: 4 mmol/L (ref 3.5–5.1)
Sodium: 136 mmol/L (ref 135–145)
Total Bilirubin: 1 mg/dL (ref 0.3–1.2)
Total Protein: 7 g/dL (ref 6.5–8.1)

## 2020-10-02 LAB — CBC WITH DIFFERENTIAL/PLATELET
Abs Immature Granulocytes: 0.06 10*3/uL (ref 0.00–0.07)
Basophils Absolute: 0.1 10*3/uL (ref 0.0–0.1)
Basophils Relative: 1 %
Eosinophils Absolute: 0.2 10*3/uL (ref 0.0–0.5)
Eosinophils Relative: 2 %
HCT: 25.6 % — ABNORMAL LOW (ref 39.0–52.0)
Hemoglobin: 7.9 g/dL — ABNORMAL LOW (ref 13.0–17.0)
Immature Granulocytes: 1 %
Lymphocytes Relative: 10 %
Lymphs Abs: 1.1 10*3/uL (ref 0.7–4.0)
MCH: 28.1 pg (ref 26.0–34.0)
MCHC: 30.9 g/dL (ref 30.0–36.0)
MCV: 91.1 fL (ref 80.0–100.0)
Monocytes Absolute: 1.1 10*3/uL — ABNORMAL HIGH (ref 0.1–1.0)
Monocytes Relative: 10 %
Neutro Abs: 8.2 10*3/uL — ABNORMAL HIGH (ref 1.7–7.7)
Neutrophils Relative %: 76 %
Platelets: 270 10*3/uL (ref 150–400)
RBC: 2.81 MIL/uL — ABNORMAL LOW (ref 4.22–5.81)
RDW: 15 % (ref 11.5–15.5)
WBC: 10.8 10*3/uL — ABNORMAL HIGH (ref 4.0–10.5)
nRBC: 0 % (ref 0.0–0.2)

## 2020-10-02 LAB — HEMOGLOBIN A1C
Hgb A1c MFr Bld: 4.7 % — ABNORMAL LOW (ref 4.8–5.6)
Mean Plasma Glucose: 88.19 mg/dL

## 2020-10-02 LAB — GLUCOSE, CAPILLARY
Glucose-Capillary: 130 mg/dL — ABNORMAL HIGH (ref 70–99)
Glucose-Capillary: 144 mg/dL — ABNORMAL HIGH (ref 70–99)
Glucose-Capillary: 167 mg/dL — ABNORMAL HIGH (ref 70–99)
Glucose-Capillary: 144 mg/dL — ABNORMAL HIGH (ref 70–99)
Glucose-Capillary: 215 mg/dL — ABNORMAL HIGH (ref 70–99)

## 2020-10-02 LAB — MAGNESIUM: Magnesium: 2 mg/dL (ref 1.7–2.4)

## 2020-10-02 MED ORDER — FUROSEMIDE 10 MG/ML IJ SOLN
80.0000 mg | Freq: Two times a day (BID) | INTRAMUSCULAR | Status: DC
  Administered 2020-10-02: 80 mg via INTRAVENOUS
  Filled 2020-10-02: qty 8

## 2020-10-02 MED ORDER — GUAIFENESIN-DM 100-10 MG/5ML PO SYRP
5.0000 mL | ORAL_SOLUTION | ORAL | Status: DC | PRN
  Administered 2020-10-02 – 2020-10-03 (×2): 5 mL via ORAL
  Filled 2020-10-02 (×2): qty 5

## 2020-10-02 NOTE — Progress Notes (Signed)
SATURATION QUALIFICATIONS: (This note is used to comply with regulatory documentation for home oxygen)  Patient Saturations on Room Air at Rest = 91%  Patient Saturations on Room Air while Ambulating = 87-88%  Patient Saturations on 2 Liters of oxygen while Ambulating = 93%

## 2020-10-02 NOTE — Progress Notes (Signed)
Internal Medicine Clinic Attending ? ?Case discussed with Dr. Winters  At the time of the visit.  We reviewed the resident?s history and exam and pertinent patient test results.  I agree with the assessment, diagnosis, and plan of care documented in the resident?s note.  ?

## 2020-10-02 NOTE — Progress Notes (Signed)
Left lower extremity venous duplex completed. Refer to "CV Proc" under chart review to view preliminary results.  10/02/2020 12:33 PM Kelby Aline., MHA, RVT, RDCS, RDMS

## 2020-10-02 NOTE — Progress Notes (Signed)
Pt refused CPAP tonight.

## 2020-10-03 DIAGNOSIS — J810 Acute pulmonary edema: Secondary | ICD-10-CM

## 2020-10-03 LAB — BASIC METABOLIC PANEL
Anion gap: 9 (ref 5–15)
BUN: 73 mg/dL — ABNORMAL HIGH (ref 6–20)
CO2: 20 mmol/L — ABNORMAL LOW (ref 22–32)
Calcium: 8.6 mg/dL — ABNORMAL LOW (ref 8.9–10.3)
Chloride: 107 mmol/L (ref 98–111)
Creatinine, Ser: 3.67 mg/dL — ABNORMAL HIGH (ref 0.61–1.24)
GFR, Estimated: 20 mL/min — ABNORMAL LOW (ref >60)
Glucose, Bld: 271 mg/dL — ABNORMAL HIGH (ref 70–99)
Potassium: 4.1 mmol/L (ref 3.5–5.1)
Sodium: 136 mmol/L (ref 135–145)

## 2020-10-03 LAB — GLUCOSE, CAPILLARY
Glucose-Capillary: 279 mg/dL — ABNORMAL HIGH (ref 70–99)
Glucose-Capillary: 222 mg/dL — ABNORMAL HIGH (ref 70–99)
Glucose-Capillary: 164 mg/dL — ABNORMAL HIGH (ref 70–99)
Glucose-Capillary: 223 mg/dL — ABNORMAL HIGH (ref 70–99)
Glucose-Capillary: 173 mg/dL — ABNORMAL HIGH (ref 70–99)

## 2020-10-03 LAB — CBC
HCT: 25.6 % — ABNORMAL LOW (ref 39.0–52.0)
Hemoglobin: 7.9 g/dL — ABNORMAL LOW (ref 13.0–17.0)
MCH: 27.8 pg (ref 26.0–34.0)
MCHC: 30.9 g/dL (ref 30.0–36.0)
MCV: 90.1 fL (ref 80.0–100.0)
Platelets: 289 10*3/uL (ref 150–400)
RBC: 2.84 MIL/uL — ABNORMAL LOW (ref 4.22–5.81)
RDW: 14.9 % (ref 11.5–15.5)
WBC: 10.3 10*3/uL (ref 4.0–10.5)
nRBC: 0 % (ref 0.0–0.2)

## 2020-10-03 LAB — MAGNESIUM: Magnesium: 2.1 mg/dL (ref 1.7–2.4)

## 2020-10-03 MED ORDER — TRAZODONE HCL 50 MG PO TABS: 50.0000 mg | ORAL_TABLET | Freq: Every day | ORAL | 0 refills | Status: DC

## 2020-10-03 MED ORDER — ZOLPIDEM TARTRATE 5 MG PO TABS: 5.0000 mg | ORAL_TABLET | Freq: Every day | ORAL | Status: DC

## 2020-10-03 MED ORDER — FUROSEMIDE 10 MG/ML IJ SOLN
80.0000 mg | Freq: Two times a day (BID) | INTRAMUSCULAR | Status: DC
  Administered 2020-10-03 (×2): 80 mg via INTRAVENOUS
  Filled 2020-10-03 (×2): qty 8

## 2020-10-03 MED ORDER — MELATONIN 3 MG PO TABS: 3.0000 mg | ORAL_TABLET | Freq: Every day | ORAL | Status: DC

## 2020-10-03 MED ORDER — ALPRAZOLAM 0.25 MG PO TABS
0.2500 mg | ORAL_TABLET | Freq: Once | ORAL | Status: AC
  Administered 2020-10-03: 0.25 mg via ORAL
  Filled 2020-10-03: qty 1

## 2020-10-03 NOTE — Evaluation (Signed)
Physical Therapy Evaluation Patient Details Name: [name] MRN: [id number] DOB: 1975-04-19 Today's Date: 10/03/2020  History of Present Illness  Pt is a 45 y.o. M who presents with SOB, LE swelling, sharp chest pain. Admitted for CHF exacerbation. Significant PMH: HTN, HLD, T2DM, R AKA, OSA, multiple DVT/PE, CKD stage IV, HFpEF.  Clinical Impression  PTA, pt lives alone, has a PCA 5 days/wk and is ambulatory with his prosthetic. Pt reports being active and performing both cardiovascular and resistance training activities at the gym with a trainer. Pt did not have his prosthetic present; but was able to demonstrate transfers into w/c modI and propelling w/c with BUE's community distances. He does present with decreased cardiopulmonary endurance. Desaturation to 78% on RA; rebounded to 91-92% on 2L O2. CHF education provided and activity recommendations. No further acute PT needs. Thank you for this consult.      Recommendations for follow up therapy are one component of a multi-disciplinary discharge planning process, led by the attending physician.  Recommendations may be updated based on patient status, additional functional criteria and insurance authorization.  Follow Up Recommendations No PT follow up    Equipment Recommendations  None recommended by PT    Recommendations for Other Services       Precautions / Restrictions Precautions Precautions: Other (comment) Precaution Comments: watch O2, L AKA baseline Restrictions Weight Bearing Restrictions: No      Mobility  Bed Mobility Overal bed mobility: Modified Independent                  Transfers Overall transfer level: Modified independent               General transfer comment: Pivot transfers into and out of the w/c  Ambulation/Gait                Hotel manager mobility: Yes Wheelchair propulsion: Both upper  extremities Wheelchair parts: Independent Distance: 800 Wheelchair Assistance Details (indicate cue type and reason): Pt taking rest breaks every ~200 ft, propelling with BUE's modI  Modified Rankin (Stroke Patients Only)       Balance                                             Pertinent Vitals/Pain Pain Assessment: No/denies pain    Home Living Family/patient expects to be discharged to:: Private residence Living Arrangements: Alone Available Help at Discharge: Available PRN/intermittently;Personal care attendant (aide M-F for 8 hours) Type of Home: Apartment Home Access: Level entry     Home Layout: One level Home Equipment: [place] - 2 wheels;Wheelchair - manual;Tub bench;Grab bars - toilet      Prior Function Level of Independence: Needs assistance   Gait / Transfers Assistance Needed: Uses prosthetic for ambulation and sometimes uses RW, does resistance training, treadmill walking, Nu step  ADL's / Homemaking Assistance Needed: Aide assist with housecleaning, and sometimes with dressing         Hand Dominance   Dominant Hand: Right    Extremity/Trunk Assessment   Upper Extremity Assessment Upper Extremity Assessment: Overall WFL for tasks assessed    Lower Extremity Assessment Lower Extremity Assessment: LLE deficits/detail LLE Deficits / Details: hx AKA    Cervical / Trunk Assessment Cervical / Trunk Assessment: Other exceptions Cervical / Trunk Exceptions:  increased body habitus  Communication   Communication: No difficulties  Cognition Arousal/Alertness: Awake/alert Behavior During Therapy: WFL for tasks assessed/performed Overall Cognitive Status: Within Functional Limits for tasks assessed                                        General Comments      Exercises     Assessment/Plan    PT Assessment Patent does not need any further PT services  PT Problem List         PT Treatment Interventions       PT Goals (Current goals can be found in the Care Plan section)  Acute Rehab PT Goals Patient Stated Goal: return to exercise program at gym PT Goal Formulation: All assessment and education complete, DC therapy    Frequency     Barriers to discharge        Co-evaluation               AM-PAC PT "6 Clicks" Mobility  Outcome Measure Help needed turning from your back to your side while in a flat bed without using bedrails?: None Help needed moving from lying on your back to sitting on the side of a flat bed without using bedrails?: None Help needed moving to and from a bed to a chair (including a wheelchair)?: None Help needed standing up from a chair using your arms (e.g., wheelchair or bedside chair)?: None Help needed to walk in hospital room?: A Little Help needed climbing 3-5 steps with a railing? : A Lot 6 Click Score: 21    End of Session Equipment Utilized During Treatment: Oxygen Activity Tolerance: Patient tolerated treatment well Patient left: Other (comment) (in wheelchair) Nurse Communication: Mobility status PT Visit Diagnosis: Difficulty in walking, not elsewhere classified (R26.2)    Time: 0350-0938 PT Time Calculation (min) (ACUTE ONLY): 26 min   Charges:   PT Evaluation $PT Eval Low Complexity: 1 Low PT Treatments $Therapeutic Activity: 8-22 mins        Wyona Almas, PT, DPT Acute Rehabilitation Services Pager [phone] Office [phone]   Deno Etienne 10/03/2020, 12:58 PM

## 2020-10-03 NOTE — Care Management (Signed)
ED CM received call from Dr. Alfonse Spruce concerning patient needing oxygen for discharge home. [employer] attempted to deliver the o2 to patient and  was told be patient's SO that the oxygen was no longer needed, so oxygen was taken back .   ED RNCM contacted Zach with [employer], and oxygen can be redelivered to patient's room for discharge sometime this evening.  Updated Dr. Alfonse Spruce, RN , and patient.  Laurena Slimmer RN, BSN  ED Care Manager [phone]

## 2020-10-03 NOTE — TOC Transition Note (Signed)
Transition of Care (TOC) - CM/SW Discharge Note Marvetta Gibbons RN, BSN Transitions of Care Unit 4E- RN Case Manager See Treatment Team for direct phone #  Weekend cross coverage  Patient Details  Name: Xiomar Crompton MRN: [id number] Date of Birth: 1975-01-24  Transition of Care [facility]) CM/SW Contact:  Dawayne Patricia, RN Phone Number: 10/03/2020, 3:28 PM   Clinical Narrative:    Notified by MD that pt will need home 02 for discharge home. Order has been placed for home 02 needs.  [employer] called for home 02 needs- transport equipment to be delivered to the room prior to discharge once insurance processed and approved.    Final next level of care: Home/Self Care Barriers to Discharge: No Barriers Identified   Patient Goals and CMS Choice Patient states their goals for this hospitalization and ongoing recovery are:: return home      Discharge Placement               home        Discharge Plan and Services   Discharge Planning Services: CM Consult Post Acute Care Choice: Durable Medical Equipment          DME Arranged: Oxygen   Date DME Agency Contacted: 10/03/20 Time DME Agency Contacted: [id number] Representative spoke with at DME Agency: [place]: NA [place] Agency: NA        Social Determinants of Health (SDOH) Interventions Food Insecurity Interventions: Intervention Not Indicated Financial Strain Interventions: Intervention Not Indicated Housing Interventions: Intervention Not Indicated Transportation Interventions: Intervention Not Indicated   Readmission Risk Interventions Readmission Risk Prevention Plan 10/03/2020 08/23/2020 04/16/2018  Transportation Screening Complete Complete Complete  PCP or Specialist Appt within 3-5 Days Complete Complete Complete  HRI or Home Care Consult Complete Complete Complete  Social Work Consult for [place] Planning/Counseling Complete Complete Complete  Palliative Care Screening Not Applicable Not Applicable Not  Applicable  Medication Review Press photographer) Complete Complete Complete  Some recent data might be hidden

## 2020-10-03 NOTE — Discharge Instructions (Signed)
Dear [name], I am so glad you are feeling better and can be discharged today! You were admitted because of CHF exacerbation by accidentally forgetting to take her Lasix.   Please see the following instructions: For your: CHF Continue taking your home meds as you are prior. Currently you require 4 L of oxygen via nasal cannula, please continue to use this until reassessed. The risk of not enough oxygen, is increased blood pressure which will further weaken your heart, worsen kidney disease, amongst others. Sleep apnea I encourage you to get a sleep study or CPAP this week.  The importance is the same as above, with decreased oxygen. Please continue taking all of your other home meds, as you are before. Rooting for you as you are working hard to lose weight.  You got this!  It was a pleasure meeting you, [name].  I wish you and your family the best, and hope you stay happy and healthy!  Merrily Brittle, DO Psychiatry PGY-1 [facility], IMTS

## 2020-10-03 NOTE — Plan of Care (Signed)
  Problem: Skin Integrity: Goal: Risk for impaired skin integrity will decrease Outcome: Completed/Met   Problem: Pain Managment: Goal: General experience of comfort will improve Outcome: Completed/Met   Problem: Elimination: Goal: Will not experience complications related to bowel motility Outcome: Progressing   Problem: Elimination: Goal: Will not experience complications related to urinary retention Outcome: Completed/Met   Problem: Nutrition: Goal: Adequate nutrition will be maintained Outcome: Completed/Met

## 2020-10-03 NOTE — Progress Notes (Signed)
SATURATION QUALIFICATIONS: (This note is used to comply with regulatory documentation for home oxygen)  Patient Saturations on Room Air at Rest = 78%  Patient Saturations on Room Air while Ambulating = N/A  Patient Saturations on 2 Liters of oxygen while Ambulating = 91-92%  Please briefly explain why patient needs home oxygen: To maintain oxygen saturations > 90% at rest and with mobility.  Wyona Almas, PT, DPT Acute Rehabilitation Services Pager [phone] Office [phone]

## 2020-10-05 ENCOUNTER — Telehealth: Payer: Self-pay | Admitting: Internal Medicine

## 2020-10-05 ENCOUNTER — Ambulatory Visit: Payer: Medicare Other

## 2020-10-05 DIAGNOSIS — S78111A Complete traumatic amputation at level between right hip and knee, initial encounter: Secondary | ICD-10-CM

## 2020-10-05 NOTE — Chronic Care Management (AMB) (Signed)
Care Management    RN Visit Note  10/05/2020 Name: [name] MRN: [id number] DOB: [birth year]  Subjective: [name] is a 45 y.o. year old male who is a primary care patient of Victor Mercury, MD. The care management team was consulted for assistance with disease management and care coordination needs.    Engaged with patient by telephone for follow up visit in response to provider referral for case management and/or care coordination services.   Consent to Services:   [name] was given information about Care Management services today including:  Care Management services includes personalized support from designated clinical staff supervised by his physician, including individualized plan of care and coordination with other care providers 24/7 contact phone numbers for assistance for urgent and routine care needs. The patient may stop case management services at any time by phone call to the office staff.  Patient agreed to services and consent obtained.   Assessment: Review of patient past medical history, allergies, medications, health status, including review of consultants reports, laboratory and other test data, was performed as part of comprehensive evaluation and provision of chronic care management services.   SDOH (Social Determinants of Health) assessments and interventions performed:    Care Plan  Allergies  Allergen Reactions   Reglan [Metoclopramide] Other (See Comments)    Dysphoric reaction   Metformin And Related Diarrhea   Ozempic (0.25 Or 0.5 Mg-Dose) [Semaglutide(0.25 Or 0.54m-Dos)] Nausea And Vomiting    Outpatient Encounter Medications as of 10/05/2020  Medication Sig Note   Accu-Chek Softclix Lancets lancets Check blood sugar up to 3 times a day (Patient taking differently: 1 each by Other route See admin instructions. Check blood sugar up to 4-5 times a day)    acidophilus (RISAQUAD) CAPS capsule Take 1 capsule by mouth daily.    Alpha-Lipoic  Acid 600 MG CAPS Take 600 mg by mouth daily.    amLODipine (NORVASC) 10 MG tablet Take 10 mg by mouth at bedtime.    apixaban (ELIQUIS) 5 MG TABS tablet Take 1 tablet (5 mg total) by mouth 2 (two) times daily.    Blood Glucose Monitoring Suppl (ACCU-CHEK GUIDE ME) w/Device KIT Use to check blood sugar up to 3 times a day (Patient taking differently: 1 each by Other route See admin instructions. Use to check blood sugar up to 4-5 times a day)    carvedilol (COREG) 25 MG tablet Take 1 tablet (25 mg total) by mouth 2 (two) times daily with a meal.    Cyanocobalamin (B-12) 500 MCG SUBL Place 500 mcg under the tongue daily.     cyclobenzaprine (FLEXERIL) 5 MG tablet Take 1 tablet (5 mg total) by mouth 3 (three) times daily as needed for muscle spasms.    famotidine (PEPCID) 20 MG tablet Take 1 tablet (20 mg total) by mouth 2 (two) times daily. (Patient taking differently: Take 20 mg by mouth 2 (two) times daily as needed for heartburn.) 11/28/2019: .   fluticasone (FLONASE) 50 MCG/ACT nasal spray Place 2 sprays into both nostrils daily. (Patient taking differently: Place 2 sprays into both nostrils daily as needed for allergies.)    furosemide (LASIX) 80 MG tablet Take 1 tablet by mouth twice daily (Patient taking differently: Take 80 mg by mouth 2 (two) times daily.)    gabapentin (NEURONTIN) 100 MG capsule TAKE 1 CAPSULE BY MOUTH THREE TIMES DAILY (Patient taking differently: Take 100 mg by mouth 3 (three) times daily.)    glucose blood (ACCU-CHEK GUIDE) test strip  Check blood sugar up to 3 times a day (Patient taking differently: 1 each by Other route See admin instructions. Check blood sugar up to 4-5 times a day)    hydrALAZINE (APRESOLINE) 100 MG tablet Take 100 mg by mouth 3 (three) times daily.    insulin aspart (NOVOLOG) 100 UNIT/ML FlexPen Administer 5 units in the morning with breakfast, 20 units with lunch, and 25 units with dinner daily.  ICD Code:  E11.9 (Patient taking differently: Inject  15-30 Units into the skin 3 (three) times daily with meals. Administer 15 units in the morning with breakfast, 25 units with lunch, and 30 units with dinner daily.  ICD Code:  E11.9)    insulin degludec (TRESIBA FLEXTOUCH) 200 UNIT/ML FlexTouch Pen INJECT 45 UNITS SUBCUTANEOUSLY ONCE DAILY (Patient taking differently: Inject 49 Units into the skin daily.)    Insulin Pen Needle (B-D UF III MINI PEN NEEDLES) 31G X 5 MM MISC 1 each by Other route See admin instructions. USE 4-5 TIMES DAILY    Insulin Pen Needle (PEN NEEDLES 3/16") 31G X 5 MM MISC Use five times daily    Lancet Devices (ACCU-CHEK SOFTCLIX) lancets Use to check blood sugars 5 times a day. Dx code:E11.9. Insulin dependent. (Patient taking differently: 1 each by Other route See admin instructions. Use to check blood sugars 5 times a day. Dx code:E11.9. Insulin dependent.)    lovastatin (MEVACOR) 20 MG tablet Take 1 tablet (20 mg total) by mouth daily.    oxybutynin (DITROPAN) 5 MG tablet Take 1 tablet (5 mg total) by mouth 3 (three) times daily.    polyethylene glycol (MIRALAX / GLYCOLAX) 17 g packet Take 17 g by mouth 2 (two) times daily. (Patient taking differently: Take 17 g by mouth as needed for mild constipation.)    potassium chloride SA (KLOR-CON) 20 MEQ tablet Take 20 mEq by mouth 2 (two) times daily.    senna-docusate (SENOKOT-S) 8.6-50 MG tablet Take 1 tablet by mouth daily. (Patient taking differently: Take 1 tablet by mouth as needed for mild constipation.)    sildenafil (VIAGRA) 50 MG tablet Take 1-2 tablets (50-100 mg total) by mouth as needed for erectile dysfunction. Take 1 hour before needed. Do not take more than 2 tablets at once.    tamsulosin (FLOMAX) 0.4 MG CAPS capsule Take 0.4 mg by mouth at bedtime.     traZODone (DESYREL) 50 MG tablet Take 1 tablet (50 mg total) by mouth at bedtime.    VICTOZA 18 MG/3ML SOPN INJECT 1.2 MG INTO THE SKIN ONCE DAILY AT THE SAME TIME (Patient taking differently: Inject 1.6 mg into the  skin daily.)    No facility-administered encounter medications on file as of 10/05/2020.    Patient Active Problem List   Diagnosis Date Noted   Acute pulmonary edema ([place])    Acute on chronic heart failure ([place]) 10/02/2020   Heart failure ([place]) 10/01/2020   Benign paroxysmal positional vertigo 09/14/2020   Anemia associated with chronic renal failure    Hemoglobinuria 12/12/2018   OSA (obstructive sleep apnea) 02/28/2018   Chronic diastolic heart failure ([place]) 02/02/2018   Preventative health care 08/15/2017   Erectile dysfunction 04/22/2017   History of pulmonary embolus (PE) 06/09/2016   Depression 05/04/2016   Hypogonadism in male 05/04/2016   Morbid obesity with BMI of 40.0-44.9, adult ([place]) 10/14/2015   Dyslipidemia 08/12/2014   Uncontrolled type 2 diabetes mellitus with hyperglycemia, with long-term current use of insulin ([place]) 08/12/2014   Unilateral AKA, right ([place])  12/26/2013   Hyperlipidemia 02/19/2013   S/P cardiac cath, 02/18/13, normal coronaries 02/19/2013   Chronic renal insufficiency, stage 3 (moderate) ([place]) 02/19/2013   Hypertension 07/24/2011   Long term current use of anticoagulant therapy 07/19/2011   History of DVT (deep vein thrombosis) 07/28/2009   Morbid obesity ([place]) 10/26/2005   Insulin dependent type 2 diabetes mellitus, controlled ([place]) 01/09/2002    Conditions to be addressed/monitored: CHF, HTN, HLD, and DMII  Care Plan : Diabetes Type 2 (Adult)  Updates made by [name], RN since 10/05/2020 12:00 AM     Problem: Currently meeting A1C target but having large fluctuations in blood sugars   Priority: High  Onset Date: 12/15/2019     Goal: Glycemic Management Optimized without large fluctuations in blood sugars   Start Date: 12/15/2019  Expected End Date: 11/07/2020  Recent Progress: On track  Priority: High  Note:   Objective: Successful outreach to patient for follow up after recent acute stay and monthly call.  Patient was recently  admitted for CHF after he did not take his lasix for a week.  Patient states he is upset as his portable oxygen concentrator charging system is not working and he needs to contact [facility] to obtain replacement charging system. Patient noted he also needs a shower bench (patient is clear he needs a heavy duty one that does not stick out, needs it to fit in his bathtub).  Discussed with Mamie, DME nurse at the clinic for assistance, who sent message to Dr. Gilford Rile to obtain order for the shower bench.  After order received,  Mamie to send message to [employer] to request they contact the patient to discuss sizes of the shower bench.  Patient also requested blood pressure cuff and O2 sat monitor which his insurance does not cover.   Lab Results  Component Value Date   HGBA1C 4.7 (L) 10/02/2020   HGBA1C 5.5 07/08/2020   HGBA1C 6.0 04/15/2020   Lab Results  Component Value Date   MICROALBUR 160.61 (H) 08/14/2013   LDLCALC 60 01/15/2020   CREATININE 3.67 (H) 10/03/2020    Current Barriers:  Knowledge Deficits related to basic Diabetes pathophysiology and self care/management Unable to independently meet pre and post meal CBG targets  Case Manager Clinical Goal(s):  Over the next 30-60 days, patient will demonstrate improved adherence to prescribed treatment plan for diabetes self care/management as evidenced by:  daily monitoring and recording of CBG  adherence to ADA/ carb modified diet exercise 5-7 days/week- adherence to prescribed medication regimen Interventions:  Review of patient status, including review of consultants reports, relevant laboratory and other test results, and medications completed. Assessed satisfaction with Freestyle Libre Flash Glucose monitor Assessed frequency of hypoglycemia and ability to self treat Assessed medication taking behavior Patient encouraged to continue his exercise routine with his personal trainer Discussed plans with patient for ongoing care  management follow up and provided patient with direct contact information for care management team: [facility] app[facility] at [facility] on 10[facility]20. Reviewed scheduled/upcoming provider appointments including:  - barriers to adherence to treatment plan identified - dental care encouraged - mutual A1C goal set or reviewed - devices to improve adherence to care identified - self-awareness of signs/symptoms of hypo or hyperglycemia encouraged Discussed plans with patient for ongoing care management follow up and provided patient with direct contact information for care management team In the next 30-60 days, patient will:  - Self administers oral medications as prescribed Self administers insulin as prescribed Self administers  injectable DM medication Victoza and Tyler Aas as prescribed Attends all scheduled provider appointments Checks blood sugars as prescribed and utilize hyper and hypoglycemia protocol as needed Adheres to prescribed ADA/carb modified Follow Up Plan: The care management team will reach out to the patient again over the next 30-45 days.       Plan: Telephone follow up appointment with care management team member scheduled for:  30-45 days.  [name], RN, BSN, CCM Care Management Coordinator [facility] Internal Medicine Phone: [phone] / Fax: [phone]

## 2020-10-05 NOTE — Patient Instructions (Signed)
Visit Information   Goals Addressed               This Visit's Progress     COMPLETED: "Maintain target A1C of <7.0% without large fluctuations in blood sugar" (pt-stated)        Timeframe:  Long-Range Goal Priority:  High Start Date:     12/15/19                        Expected End Date:  11/08/20                    Lab Results  Component Value Date   HGBA1C 4.7 (L) 10/02/2020      - set target A1C - meeting target       Blood Pressure < 140/90        BP Readings from Last 3 Encounters:  10/03/20 (!) 146/79  09/28/20 (!) 149/79  09/23/20 (!) 143/71    Not meeting blood pressure targets      HEMOGLOBIN A1C < 7         Lab Results  Component Value Date   HGBA1C 4.7 (L) 10/02/2020   HGBA1C 5.5 07/08/2020   HGBA1C 6.0 04/15/2020    Meeting Hgb A1C target         The patient verbalized understanding of instructions, educational materials, and care plan provided today and declined offer to receive copy of patient instructions, educational materials, and care plan.   Telephone follow up appointment with care management team member scheduled for: 11/09/20@2PM   [name], RN, BSN, CCM Care Management Coordinator [facility] Internal Medicine Phone: [phone] / Fax: [phone]

## 2020-10-05 NOTE — Telephone Encounter (Signed)
TOC HFU APPT  Date: 10/12/2020    Time: 10:15 AM    Visit Type: OPEN ESTABLISHED [726]    Provider: Virl Axe, MD

## 2020-10-07 ENCOUNTER — Encounter (HOSPITAL_COMMUNITY): Payer: Medicare Other

## 2020-10-11 DIAGNOSIS — E876 Hypokalemia: Secondary | ICD-10-CM | POA: Diagnosis not present

## 2020-10-11 DIAGNOSIS — I129 Hypertensive chronic kidney disease with stage 1 through stage 4 chronic kidney disease, or unspecified chronic kidney disease: Secondary | ICD-10-CM | POA: Diagnosis not present

## 2020-10-11 DIAGNOSIS — E669 Obesity, unspecified: Secondary | ICD-10-CM | POA: Diagnosis not present

## 2020-10-11 DIAGNOSIS — N2581 Secondary hyperparathyroidism of renal origin: Secondary | ICD-10-CM | POA: Diagnosis not present

## 2020-10-11 DIAGNOSIS — N184 Chronic kidney disease, stage 4 (severe): Secondary | ICD-10-CM | POA: Diagnosis not present

## 2020-10-12 ENCOUNTER — Telehealth: Payer: Self-pay

## 2020-10-12 ENCOUNTER — Ambulatory Visit (INDEPENDENT_AMBULATORY_CARE_PROVIDER_SITE_OTHER): Payer: Medicare Other | Admitting: Student

## 2020-10-12 DIAGNOSIS — E119 Type 2 diabetes mellitus without complications: Secondary | ICD-10-CM | POA: Diagnosis not present

## 2020-10-12 DIAGNOSIS — Z794 Long term (current) use of insulin: Secondary | ICD-10-CM | POA: Diagnosis not present

## 2020-10-12 DIAGNOSIS — I5032 Chronic diastolic (congestive) heart failure: Secondary | ICD-10-CM

## 2020-10-12 MED ORDER — OXYBUTYNIN CHLORIDE 5 MG PO TABS: 5.0000 mg | ORAL_TABLET | Freq: Three times a day (TID) | ORAL | 0 refills | Status: DC

## 2020-10-12 MED ORDER — TAMSULOSIN HCL 0.4 MG PO CAPS: 0.4000 mg | ORAL_CAPSULE | Freq: Every day | ORAL | 1 refills | Status: DC

## 2020-10-12 NOTE — Assessment & Plan Note (Signed)
Patient with history of T2DM, well controlled on victoza 1.6mg  daily, tresiba 49u daily, and novolog 15-30u with meals. He follows with Endocrinology and his next visit is on 10/26/2020.  Recent A1c on 9/24 at 4.7% which is excellent control. I commended him on this.  Plan: -f/u with endocrinology -continue current medications

## 2020-10-12 NOTE — Telephone Encounter (Signed)
  Chronic Care Management   Note  10/12/2020 Name: [name] MRN: [id number] DOB: [birth year]  Received call from patient inquiring about how he would return the oxygen equipment as he no longer needs to use it after his visit at the clinic today.  Explained to patient he needs to contact [employer] and they will come and pick up the oxygen.  Patient also inquired about his shower bench and I let him know we did receive the order from MD and notified [employer].  They will contact patient as he has specific needs for the size of the bench.  Patient to follow up with this RNCM if he has issues with [employer].  [name], RN, BSN, CCM Care Management Coordinator [facility] Internal Medicine Phone: [phone] / Fax: [phone]

## 2020-10-12 NOTE — Patient Instructions (Addendum)
[name],  It was a pleasure seeing you in the clinic today. I am so glad to see that you are doing better after leaving the hospital!  Continue taking your lasix twice daily.  I have refilled your oxybutynin and tamsulosin as requested. Please come back in 3 months for a follow up visit to recheck your hemoglobin A1c!  Please call our clinic at [phone] if you have any questions or concerns. The best time to call is Monday-Friday from 9am-4pm, but there is someone available 24/7 at the same number. If you need medication refills, please notify your pharmacy one week in advance and they will send [place] a request.   Thank you for letting us take part in your care. We look forward to seeing you next time!

## 2020-10-12 NOTE — Assessment & Plan Note (Signed)
[name] presents today for f/u after recent hospitalization for acute on chronic HF. He was hospitalized after missing a few doses of lasix and treated with IV diuresis. He was discharged with PO lasix 80mg  BID and oxygen at 2L [place]. Since being discharged, he reports doing a lot better. His SHOB has improved and he no longer needs oxygen even with activity and exercise, which he performs daily. Furthermore, his swelling has also improved significantly.  He has seen Dr. Joelyn Oms, nephrology, after discharge and was told to continue his lasix 80mg  BID regimen for now.  He reports no new complaints and emphasizes that he will pay closer attention to taking his lasix regularly without missing any doses, which is encouraged.  Plan: -continue PO lasix 80mg  BID -F/u in 3 months

## 2020-10-12 NOTE — Progress Notes (Signed)
CC: hospital f/u  HPI:  [name] is a 44 y.o. male with history listed below presenting to the [facility] for hospital f/u. Please see individualized problem based charting for full HPI.  Past Medical History:  Diagnosis Date   Acute venous embolism and thrombosis of deep vessels of proximal lower extremity (HCC) 07/19/2011   Anemia    Anginal pain (HCC)    pt denies   Anxiety    Chest pain, neg MI, normal coronaries by cath 02/18/2013   pt denies   CHF (congestive heart failure) (HCC)    CKD (chronic kidney disease) stage 3, GFR 30-59 ml/min ([place]) 02/19/2013   Colon polyps    Depression    Diabetic ulcer of right foot ([place])    DVT (deep venous thrombosis) ([place]) 09/2002   patient reports additional DVTs in '06 & '11 (unconfirmed)   ED (erectile dysfunction)    GERD (gastroesophageal reflux disease)    History of blood transfusion    "related to OR" (10/31/2016)   Hyperlipidemia 02/19/2013   Hypertension    Nausea & vomiting    "constant for the last couple weeks" (10/31/2016)   Nephrotic syndrome    Obesity    BMI 44, weight 346 pounds 01/30/14   OSA (obstructive sleep apnea) 02/28/2018   Mild obstructive sleep apnea with an AHI of 9.8/h but severe during REM sleep with an AHI of 33.8/h.  Oxygen saturations dropped to 86% and there was moderate snoring   Peripheral edema    Pneumonia    Prosthesis adjustment 08/17/2016   Pulmonary embolism ([place]) 09/2002   treated with 6 months of warfarin   Pyelonephritis 02/02/2018   Type I diabetes mellitus ([place]) dx'd 2001   Urinary retention     Review of Systems:  Negative aside from that listed in individualized problem based charting.  Physical Exam:  Vitals:   10/12/20 1008  BP: (!) 141/63  Pulse: 94  SpO2: 97%   Physical Exam Constitutional:      Appearance: He is obese. He is not ill-appearing.  HENT:     Mouth/Throat:     Mouth: Mucous membranes are moist.     Pharynx: Oropharynx is clear. No oropharyngeal exudate.   Eyes:     Extraocular Movements: Extraocular movements intact.     Conjunctiva/sclera: Conjunctivae normal.     Pupils: Pupils are equal, round, and reactive to light.  Cardiovascular:     Rate and Rhythm: Normal rate and regular rhythm.     Pulses: Normal pulses.     Heart sounds: Normal heart sounds.    No friction rub. No gallop.  Pulmonary:     Effort: Pulmonary effort is normal.     Breath sounds: Normal breath sounds. No wheezing, rhonchi or rales.  Abdominal:     General: Bowel sounds are normal. There is no distension.     Palpations: Abdomen is soft.     Tenderness: There is no abdominal tenderness.  Musculoskeletal:     Comments: Right AKA. Minimal pitting edema in LLE, no sacral edema noted.  Skin:    General: Skin is warm and dry.  Neurological:     General: No focal deficit present.     Mental Status: He is alert and oriented to person, place, and time.  Psychiatric:        Mood and Affect: Mood normal.        Behavior: Behavior normal.     Assessment & Plan:   See Encounters Tab  for problem based charting.  Patient discussed with Dr. Daryll Drown

## 2020-10-13 ENCOUNTER — Telehealth: Payer: Self-pay | Admitting: *Deleted

## 2020-10-13 ENCOUNTER — Ambulatory Visit: Payer: Self-pay

## 2020-10-13 DIAGNOSIS — Z9981 Dependence on supplemental oxygen: Secondary | ICD-10-CM

## 2020-10-13 NOTE — Telephone Encounter (Signed)
Myriam Jacobson, Mamie C, [place]; [place], [place]; [place], Sandrea Hammond; Nash Shearer; 2 others  Got it! Thank you!    ----- Message -----  From: Judge Stall, [place]  Sent: 10/12/2020  12:22 PM EDT  To: [name], Chapman Fitch, *  Subject: DME Order                                       There is a order in for patient for a shower bench,the patient is right leg amputation,his weight is 336 lbs height is 6 2. I would call the patient because his bathroom has a different set up. Thank you for yyour help Silverio Decamp C10/4/202212:22 PM

## 2020-10-13 NOTE — Chronic Care Management (AMB) (Signed)
  Chronic Care Management   Note  10/13/2020 Name: Taiyo Kozma MRN: [id number] DOB: Mar 11, 1975    Follow up plan: Received a call from patient who had spoken with [facility] regarding his oxygen supplies.  Patient would like them to pick up the oxygen supplies and they will not do so until there is an order to D/C oxygen from his provider.  Message to be sent to [facility] and PCP Gilford Rile to see if they will dc.  Plan to check back and let [employer] know when order is in system.  [name], RN, BSN, CCM Care Management Coordinator [facility] Internal Medicine Phone: [phone] / Fax: [phone]

## 2020-10-14 ENCOUNTER — Ambulatory Visit: Payer: Medicare Other | Attending: Internal Medicine | Admitting: Physical Therapy

## 2020-10-15 ENCOUNTER — Ambulatory Visit: Payer: Self-pay

## 2020-10-15 NOTE — Chronic Care Management (AMB) (Signed)
  Chronic Care Management   Note  10/15/2020 Name: [name] MRN: [id number] DOB: 1975/06/12  Follow up plan: Patient called this RNCM on 10/13/20 and notified me that [employer] would not pick up his Oxygen supplies until there was an order for it to be discontinued.  Collaborated with Dr. Allyson Sabal who agreed patient did not need oxygen at this time and he would d/c in the system. Phone call made to [name] at [employer] to notify of patient's oxygen being discontinued and the supplies could be picked up from patient.  Called patient to let him know that the order had been received and if he does not hear from [employer] to schedule time to pick up the oxygen, he should call them.  [name], RN, BSN, CCM Care Management Coordinator [facility] Internal Medicine Phone: [phone] / Fax: [phone]

## 2020-10-19 ENCOUNTER — Other Ambulatory Visit: Payer: Self-pay

## 2020-10-19 ENCOUNTER — Ambulatory Visit (HOSPITAL_COMMUNITY)
Admission: RE | Admit: 2020-10-19 | Discharge: 2020-10-19 | Disposition: A | Discharge status: Home / Self Care | Payer: Medicare Other | Source: Ambulatory Visit | Attending: Nephrology | Admitting: Nephrology

## 2020-10-19 VITALS — BP 139/70 | HR 90 | Temp 98.3°F | Resp 18

## 2020-10-19 DIAGNOSIS — D631 Anemia in chronic kidney disease: Secondary | ICD-10-CM | POA: Insufficient documentation

## 2020-10-19 DIAGNOSIS — N183 Chronic kidney disease, stage 3 unspecified: Secondary | ICD-10-CM | POA: Insufficient documentation

## 2020-10-19 LAB — FERRITIN: Ferritin: 409 ng/mL — ABNORMAL HIGH (ref 24–336)

## 2020-10-19 LAB — POCT HEMOGLOBIN-HEMACUE: Hemoglobin: 9.6 g/dL — ABNORMAL LOW (ref 13.0–17.0)

## 2020-10-19 LAB — RENAL FUNCTION PANEL
Albumin: 3.4 g/dL — ABNORMAL LOW (ref 3.5–5.0)
Anion gap: 8 (ref 5–15)
BUN: 67 mg/dL — ABNORMAL HIGH (ref 6–20)
CO2: 22 mmol/L (ref 22–32)
Calcium: 9.3 mg/dL (ref 8.9–10.3)
Chloride: 104 mmol/L (ref 98–111)
Creatinine, Ser: 2.88 mg/dL — ABNORMAL HIGH (ref 0.61–1.24)
GFR, Estimated: 27 mL/min — ABNORMAL LOW (ref >60)
Glucose, Bld: 50 mg/dL — ABNORMAL LOW (ref 70–99)
Phosphorus: 4.5 mg/dL (ref 2.5–4.6)
Potassium: 4.1 mmol/L (ref 3.5–5.1)
Sodium: 134 mmol/L — ABNORMAL LOW (ref 135–145)

## 2020-10-19 LAB — IRON AND TIBC
Iron: 39 ug/dL — ABNORMAL LOW (ref 45–182)
Saturation Ratios: 14 % — ABNORMAL LOW (ref 17.9–39.5)
TIBC: 287 ug/dL (ref 250–450)
UIBC: 248 ug/dL

## 2020-10-19 MED ORDER — DARBEPOETIN ALFA 200 MCG/0.4ML IJ SOSY
PREFILLED_SYRINGE | INTRAMUSCULAR | Status: AC
  Administered 2020-10-19: 200 ug via SUBCUTANEOUS
  Filled 2020-10-19: qty 0.4

## 2020-10-19 MED ORDER — DARBEPOETIN ALFA 200 MCG/0.4ML IJ SOSY: 200.0000 ug | PREFILLED_SYRINGE | INTRAMUSCULAR | Status: DC

## 2020-10-20 LAB — PTH, INTACT AND CALCIUM
Calcium, Total (PTH): 9.6 mg/dL (ref 8.7–10.2)
PTH: 47 pg/mL (ref 15–65)

## 2020-10-21 DIAGNOSIS — E113312 Type 2 diabetes mellitus with moderate nonproliferative diabetic retinopathy with macular edema, left eye: Secondary | ICD-10-CM | POA: Diagnosis not present

## 2020-10-21 NOTE — Progress Notes (Signed)
Internal Medicine Clinic Attending  Case discussed with Dr. Jinwala  At the time of the visit.  We reviewed the resident's history and exam and pertinent patient test results.  I agree with the assessment, diagnosis, and plan of care documented in the resident's note.  

## 2020-10-26 ENCOUNTER — Other Ambulatory Visit: Payer: Self-pay

## 2020-10-26 ENCOUNTER — Other Ambulatory Visit (INDEPENDENT_AMBULATORY_CARE_PROVIDER_SITE_OTHER): Payer: Medicare Other

## 2020-10-26 DIAGNOSIS — Z794 Long term (current) use of insulin: Secondary | ICD-10-CM

## 2020-10-26 DIAGNOSIS — E785 Hyperlipidemia, unspecified: Secondary | ICD-10-CM

## 2020-10-26 DIAGNOSIS — E1165 Type 2 diabetes mellitus with hyperglycemia: Secondary | ICD-10-CM

## 2020-10-26 LAB — LIPID PANEL
Cholesterol: 105 mg/dL (ref 0–200)
HDL: 37.4 mg/dL — ABNORMAL LOW (ref >39.00)
LDL Cholesterol: 52 mg/dL (ref 0–99)
NonHDL: 67.78
Total CHOL/HDL Ratio: 3
Triglycerides: 81 mg/dL (ref 0.0–149.0)
VLDL: 16.2 mg/dL (ref 0.0–40.0)

## 2020-10-26 LAB — GLUCOSE, RANDOM: Glucose, Bld: 179 mg/dL — ABNORMAL HIGH (ref 70–99)

## 2020-10-27 LAB — FRUCTOSAMINE: Fructosamine: 326 umol/L — ABNORMAL HIGH (ref 0–285)

## 2020-10-28 ENCOUNTER — Ambulatory Visit: Payer: Medicare Other | Admitting: Endocrinology

## 2020-11-02 ENCOUNTER — Other Ambulatory Visit: Payer: Self-pay | Admitting: Student

## 2020-11-04 DIAGNOSIS — E113312 Type 2 diabetes mellitus with moderate nonproliferative diabetic retinopathy with macular edema, left eye: Secondary | ICD-10-CM | POA: Diagnosis not present

## 2020-11-05 ENCOUNTER — Ambulatory Visit (INDEPENDENT_AMBULATORY_CARE_PROVIDER_SITE_OTHER): Payer: Medicare Other | Admitting: Endocrinology

## 2020-11-05 ENCOUNTER — Encounter: Payer: Self-pay | Admitting: Endocrinology

## 2020-11-05 ENCOUNTER — Other Ambulatory Visit: Payer: Self-pay

## 2020-11-05 VITALS — BP 142/76 | HR 94 | Ht 74.0 in | Wt 343.0 lb

## 2020-11-05 DIAGNOSIS — Z794 Long term (current) use of insulin: Secondary | ICD-10-CM

## 2020-11-05 DIAGNOSIS — E1165 Type 2 diabetes mellitus with hyperglycemia: Secondary | ICD-10-CM

## 2020-11-05 NOTE — Patient Instructions (Addendum)
Tresiba 44 units  No Novolog at am meals  Juice befor exercise

## 2020-11-05 NOTE — Progress Notes (Signed)
Patient ID: [name], male   DOB: June 30, 1975, 45 y.o.   MRN: [id number]          Reason for Appointment: Follow-up for Type 2 Diabetes    History of Present Illness:          Date of diagnosis of type 2 diabetes mellitus:  2001      His weight was about 400 pounds at the time of diagnosis He does not know what his initial blood sugars and circumstances were but he apparently was started on insulin initially He was not able to tolerate metformin because of diarrhea Subsequently has been on insulin only, level of control not available but his A1c was significantly high at 12.8 in 2015  Background history:    Recent history:   INSULIN regimen is:   48 units Tresiba once daily  NovoLog 10 units breakfast 15-20  units before lunch and 25 units at dinner   Non-insulin hypoglycemic drugs the patient is taking are:  Victoza: 1.8 mg daily   Current management, blood sugar patterns and problems identified:   His A1c is 4.7, previously 5.5, previously 6% Fructosamine is relatively higher at 326 This may be affected by his anemia  This has been accurate by comparison, glucose was 71 on fingerstick in the office compared to 70 on his sensor  Interpretation of his blood sugar patterns from his freestyle libre are as follows: Blood sugars overall are very consistently controlled at about the same level except generally lower between about 6 AM and 9 AM and only mild variability after lunch and dinner  Overnight blood sugars are somewhat variable but may tend to be low on a couple of occasions, averaging around 115  HYPOGLYCEMIA has been mostly occurring in the mornings around 7-10 AM 25% of the time Hi his blood sugars on an average are between 9 PM-11 PM POSTPRANDIAL readings are lower after breakfast, periodically with hypoglycemia and continue to be low normal until lunchtime Postprandial readings after lunch may be occasionally around 150-160 but generally well controlled After  dinner blood sugars are not rising excessively Blood sugars have been above 180 only once or twice late evening Premeal reading may be relatively low before dinner  Management, insulin regimen and problems:  He has been advised to not take NovoLog in the mornings on his exercise days but he still tried to have hypoglycemia even though he is having some carbohydrate before exercising and also may have more carbohydrate later in the morning  Has not reduced his Tyler Aas despite occasional overnight low sugars also  He thinks he is adjusting his mealtime dose based on how much he is eating but generally taking the most at dinnertime Trying to take insulin at the start of the meal No hypoglycemia with mealtime doses with only occasional low normal readings Still has difficulty losing weight despite taking Victoza and exercising regularly    Side effects from medications have been: Diarrhea with metformin, vomiting with Ozempic  Glucose monitoring:  Currently with freestyle libre version 2 with data as follows  CGM use % of time 62  2-week average/GV 110  Time in range 88     %  % Time Above 180 3  % Time above 250   % Time Below 70 9     PRE-MEAL Fasting Lunch Dinner Bedtime Overall  Glucose range:       Averages: 115 103 116 129    POST-MEAL PC Breakfast PC Lunch PC Dinner  Glucose  range:     Averages: 86 118 137    PREVIOUSLY:   CGM use % of time 84  2-week average/GV 134  Time in range     81   %  % Time Above 180 13  % Time above 250   % Time Below 70 6     PRE-MEAL Fasting Lunch Dinner Bedtime Overall  Glucose range:       Averages: 146 114 121     POST-MEAL PC Breakfast PC Lunch PC Dinner  Glucose range:     Averages: 114 133 132     Self-care:    Typical meal intake: Breakfast is egg whites, some oatmeal/grits and banana.  Usually small portions of meat or other protein at lunch and dinner and only a small amount of sweet potato at lunch or carbohydrate               Dietician visit, most recent: Unknown               Weight history:  Wt Readings from Last 3 Encounters:  11/05/20 (!) 343 lb (155.6 kg)  10/03/20 (!) 344 lb 2.2 oz (156.1 kg)  09/28/20 (!) 336 lb (152.4 kg)    Glycemic control:   Lab Results  Component Value Date   HGBA1C 4.7 (L) 10/02/2020   HGBA1C 5.5 07/08/2020   HGBA1C 6.0 04/15/2020   Lab Results  Component Value Date   MICROALBUR 160.61 (H) 08/14/2013   LDLCALC 52 10/26/2020   CREATININE 2.88 (H) 10/19/2020   Lab Results  Component Value Date   MICRALBCREAT 1,528.4 (H) 02/15/2017    Lab Results  Component Value Date   FRUCTOSAMINE 326 (H) 10/26/2020   FRUCTOSAMINE 278 07/15/2020   FRUCTOSAMINE 315 (H) 04/15/2020   Lab Results  Component Value Date   HGB 9.6 (L) 10/19/2020      Allergies as of 11/05/2020       Reactions   Reglan [metoclopramide] Other (See Comments)   Dysphoric reaction   Metformin And Related Diarrhea   Ozempic (0.25 Or 0.5 Mg-dose) [semaglutide(0.25 Or 0.53m-dos)] Nausea And Vomiting        Medication List        Accurate as of November 05, 2020 10:09 AM. If you have any questions, ask your nurse or doctor.          Accu-Chek Guide Me w/Device Kit Use to check blood sugar up to 3 times a day What changed:  how much to take how to take this when to take this additional instructions   Accu-Chek Guide test strip Generic drug: glucose blood Check blood sugar up to 3 times a day What changed:  how much to take how to take this when to take this additional instructions   accu-chek softclix lancets Use to check blood sugars 5 times a day. Dx code:E11.9. Insulin dependent. What changed:  how much to take how to take this when to take this   Accu-Chek Softclix Lancets lancets Check blood sugar up to 3 times a day What changed:  how much to take how to take this when to take this additional instructions   acidophilus Caps capsule Take 1 capsule  by mouth daily.   Alpha-Lipoic Acid 600 MG Caps Take 600 mg by mouth daily.   amLODipine 10 MG tablet Commonly known as: NORVASC Take 10 mg by mouth at bedtime.   apixaban 5 MG Tabs tablet Commonly known as: Eliquis Take 1 tablet (5 mg total) by  mouth 2 (two) times daily.   B-12 500 MCG Subl Place 500 mcg under the tongue daily.   B-D UF III MINI PEN NEEDLES 31G X 5 MM Misc Generic drug: Insulin Pen Needle 1 each by Other route See admin instructions. USE 4-5 TIMES DAILY   Pen Needles 3/16" 31G X 5 MM Misc Use five times daily   carvedilol 25 MG tablet Commonly known as: COREG Take 1 tablet (25 mg total) by mouth 2 (two) times daily with a meal.   cyclobenzaprine 5 MG tablet Commonly known as: FLEXERIL Take 1 tablet (5 mg total) by mouth 3 (three) times daily as needed for muscle spasms.   famotidine 20 MG tablet Commonly known as: PEPCID Take 1 tablet (20 mg total) by mouth 2 (two) times daily. What changed:  when to take this reasons to take this   fluticasone 50 MCG/ACT nasal spray Commonly known as: Flonase Place 2 sprays into both nostrils daily. What changed:  when to take this reasons to take this   furosemide 80 MG tablet Commonly known as: LASIX Take 1 tablet by mouth twice daily What changed: when to take this   gabapentin 100 MG capsule Commonly known as: NEURONTIN TAKE 1 CAPSULE BY MOUTH THREE TIMES DAILY   hydrALAZINE 100 MG tablet Commonly known as: APRESOLINE Take 100 mg by mouth 3 (three) times daily.   insulin aspart 100 UNIT/ML FlexPen Commonly known as: NOVOLOG Administer 5 units in the morning with breakfast, 20 units with lunch, and 25 units with dinner daily.  ICD Code:  E11.9 What changed:  how much to take how to take this when to take this additional instructions   lovastatin 20 MG tablet Commonly known as: MEVACOR Take 1 tablet (20 mg total) by mouth daily.   oxybutynin 5 MG tablet Commonly known as: DITROPAN Take 1  tablet (5 mg total) by mouth 3 (three) times daily.   polyethylene glycol 17 g packet Commonly known as: MIRALAX / GLYCOLAX Take 17 g by mouth 2 (two) times daily. What changed:  when to take this reasons to take this   potassium chloride SA 20 MEQ tablet Commonly known as: KLOR-CON Take 20 mEq by mouth 2 (two) times daily.   senna-docusate 8.6-50 MG tablet Commonly known as: Senokot-S Take 1 tablet by mouth daily. What changed:  when to take this reasons to take this   sildenafil 50 MG tablet Commonly known as: VIAGRA Take 1-2 tablets (50-100 mg total) by mouth as needed for erectile dysfunction. Take 1 hour before needed. Do not take more than 2 tablets at once.   tamsulosin 0.4 MG Caps capsule Commonly known as: FLOMAX Take 1 capsule (0.4 mg total) by mouth at bedtime.   Tyler Aas FlexTouch 200 UNIT/ML FlexTouch Pen Generic drug: insulin degludec INJECT 45 UNITS SUBCUTANEOUSLY ONCE DAILY What changed:  how much to take how to take this when to take this additional instructions   Victoza 18 MG/3ML Sopn Generic drug: liraglutide INJECT 1.2 MG INTO THE SKIN ONCE DAILY AT THE SAME TIME What changed: See the new instructions.        Allergies:  Allergies  Allergen Reactions   Reglan [Metoclopramide] Other (See Comments)    Dysphoric reaction   Metformin And Related Diarrhea   Ozempic (0.25 Or 0.5 Mg-Dose) [Semaglutide(0.25 Or 0.62m-Dos)] Nausea And Vomiting    Past Medical History:  Diagnosis Date   [place] 07[place]  Anemia    Anginal pain (HCC)    pt denies   Anxiety    Chest pain, neg MI, normal coronaries by cath 02/18/2013   pt denies   CHF (congestive heart failure) (HCC)    CKD (chronic kidney disease) stage 3, GFR 30-59 ml/min (HCC) 02/19/2013   Colon polyps    Depression    Diabetic ulcer of right foot ([place])    DVT (deep venous thrombosis) ([place]) 09/2002   patient  reports additional DVTs in '06 & '11 (unconfirmed)   ED (erectile dysfunction)    GERD (gastroesophageal reflux disease)    History of blood transfusion    "related to OR" (10/31/2016)   Hyperlipidemia 02/19/2013   Hypertension    Nausea & vomiting    "constant for the last couple weeks" (10/31/2016)   Nephrotic syndrome    Obesity    BMI 44, weight 346 pounds 01/30/14   OSA (obstructive sleep apnea) 02/28/2018   Mild obstructive sleep apnea with an AHI of 9.8/h but severe during REM sleep with an AHI of 33.8/h.  Oxygen saturations dropped to 86% and there was moderate snoring   Peripheral edema    Pneumonia    Prosthesis adjustment 08/17/2016   Pulmonary embolism ([place]) 09/2002   treated with 6 months of warfarin   Pyelonephritis 02/02/2018   Type I diabetes mellitus ([place]) dx'd 2001   Urinary retention     Past Surgical History:  Procedure Laterality Date   AMPUTATION Right 12/22/2013   Procedure: AMPUTATION BELOW KNEE;  Surgeon: [name], MD;  Location: [place];  Service: Orthopedics;  Laterality: Right;   AMPUTATION Right 02/19/2014   Procedure: RIGHT ABOVE KNEE AMPUTATION ;  Surgeon: [name], MD;  Location: [place];  Service: Orthopedics;  Laterality: Right;   blood clot removal  2016   CARDIAC CATHETERIZATION  02/18/2013   normal coronaries   ESOPHAGOGASTRODUODENOSCOPY N/A 11/02/2016   Procedure: ESOPHAGOGASTRODUODENOSCOPY (EGD);  Surgeon: [name], MD;  [facility] ENDOSCOPY;  Service: Endoscopy;  Laterality: N/A;   I & D EXTREMITY Right 12/14/2013   Procedure: IRRIGATION AND DEBRIDEMENT RIGHT FOOT;  Surgeon: [name], MD;  Location: [place];  Service: Orthopedics;  Laterality: Right;   INCISION AND DRAINAGE ABSCESS  2007; 2015   "back"   INCISION AND DRAINAGE OF WOUND Right 12/22/2013   Procedure: I&D RIGHT BUTTOCK;  Surgeon: [name], MD;  Location: [place];  Service: Orthopedics;  Laterality: Right;   LEFT HEART CATHETERIZATION WITH CORONARY ANGIOGRAM N/A  02/18/2013   Procedure: LEFT HEART CATHETERIZATION WITH CORONARY ANGIOGRAM;  Surgeon: [name], MD;  [facility] CATH LAB;  Service: Cardiovascular;  Laterality: N/A;    Family History  Problem Relation Age of Onset   Heart disease Mother    Diabetes Mother    Diabetes Maternal Uncle    Diabetes Cousin     Social History:  reports that he has never smoked. He has never used smokeless tobacco. He reports current alcohol use. He reports that he does not use drugs.   Review of Systems    Lipid history: Has been on lovastatin 20 mg with labs as follows: Prescribed by PCP   Lab Results  Component Value Date   CHOL 105 10/26/2020   HDL 37.40 (L) 10/26/2020   LDLCALC 52 10/26/2020   TRIG 81.0 10/26/2020   CHOLHDL 3 10/26/2020           Hypertension: He is taking clonidine, Norvasc, hydralazine and Coreg followed  by nephrologist  BP Readings from Last 3 Encounters:  11/05/20 (!) 142/76  10/19/20 139/70  10/12/20 (!) 141/63     Most recent eye exam was in 3/41   Complications of diabetes: Nephropathy with renal insufficiency and proteinuria, unknown status of retinopathy, erectile dysfunction  Followed by nephrologist for CKD He is on ESA as well as iron infusions for anemia  Lab Results  Component Value Date   CREATININE 2.88 (H) 10/19/2020   CREATININE 3.67 (H) 10/03/2020   CREATININE 3.47 (H) 10/02/2020    Lab Results  Component Value Date   HGB 9.6 (L) 10/19/2020      LABS:  No visits with results within 1 Week(s) from this visit.  Latest known visit with results is:  Lab on 10/26/2020  Component Date Value Ref Range Status   Fructosamine 10/26/2020 326 (A)  0 - 285 umol/L Final   Comment: Published reference interval for apparently healthy subjects between age 23 and 26 is 38 - 285 umol/L and in a poorly controlled diabetic population is 228 - 563 umol/L with a mean of 396 umol/L.    Cholesterol 10/26/2020 105  0 - 200 mg/dL Final   ATP III  Classification       Desirable:  < 200 mg/dL               Borderline High:  200 - 239 mg/dL          High:  > = 240 mg/dL   Triglycerides 10/26/2020 81.0  0.0 - 149.0 mg/dL Final   Normal:  <150 mg/dLBorderline High:  150 - 199 mg/dL   HDL 10/26/2020 37.40 (A)  >39.00 mg/dL Final   VLDL 10/26/2020 16.2  0.0 - 40.0 mg/dL Final   LDL Cholesterol 10/26/2020 52  0 - 99 mg/dL Final   Total CHOL/HDL Ratio 10/26/2020 3   Final                  Men          Women1/2 Average Risk     3.4          3.3Average Risk          5.0          4.42X Average Risk          9.6          7.13X Average Risk          15.0          11.0                       NonHDL 10/26/2020 67.78   Final   NOTE:  Non-HDL goal should be 30 mg/dL higher than patient's LDL goal (i.e. LDL goal of < 70 mg/dL, would have non-HDL goal of < 100 mg/dL)   Glucose, Bld 10/26/2020 179 (A)  70 - 99 mg/dL Final    Physical Examination:  BP (!) 142/76   Pulse 94   Ht 6' 4"  (1.93 m)   Wt (!) 343 lb (155.6 kg)   SpO2 96%   BMI 41.75 kg/m       ASSESSMENT:  Diabetes type 2, with morbid obesity  See history of present illness for detailed discussion of current diabetes management, blood sugar patterns and problems identified  His last A1c is falsely low at 4.7 last month  Fructosamine is relatively higher at 326  Is on basal bolus insulin and Victoza 1.8 mg  daily  However despite higher fructosamine his blood sugars are only averaging 110 in the last 2 weeks with 9% of the readings below 70 Appears to be getting excessive hypoglycemia with his exercise routine in the morning even with not taking mealtime insulin before his first exercise session He is also likely getting excessive basal insulin with some overnight hypoglycemia and low normal readings before dinner at times Discussed that avoiding hypoglycemia is important especially with his difficulty with losing weight He wants to continue medical management of his  obesity  Hypertension: Blood pressure high normal today   PLAN:   Consider Mounjaro when covered by Medicare otherwise stay on Victoza Reduce Tresiba from 48 down to 44 units at least 2 keep morning sugars between 80-120 He will need to drink additional juice or regular soft drink before exercising in the morning to prevent low sugars He does not need any NovoLog when he is eating midmorning for the second time Adjust mealtime doses at lunch and dinner based on portions and carbohydrates  Encouraged more calorie restriction and lower fat intake  Discussed postprandial targets also Consider freestyle libre 3 when approved by Medicare  The time on his freestyle libre reader was set to the correct time  Follow-up in 12 weeks   There are no Patient Instructions on file for this visit.  Total visit time including counseling = 30 minutes    [name] 11/05/2020, 10:09 AM   Note: This office note was prepared with Dragon voice recognition system technology. Any transcriptional errors that result from this process are unintentional.

## 2020-11-06 ENCOUNTER — Other Ambulatory Visit: Payer: Self-pay | Admitting: Endocrinology

## 2020-11-09 ENCOUNTER — Encounter (HOSPITAL_COMMUNITY)
Admission: RE | Admit: 2020-11-09 | Discharge: 2020-11-09 | Disposition: A | Discharge status: Home / Self Care | Payer: Medicare Other | Source: Ambulatory Visit | Attending: Nephrology | Admitting: Nephrology

## 2020-11-09 ENCOUNTER — Ambulatory Visit (INDEPENDENT_AMBULATORY_CARE_PROVIDER_SITE_OTHER): Payer: Medicare Other | Admitting: Orthopedic Surgery

## 2020-11-09 ENCOUNTER — Other Ambulatory Visit: Payer: Self-pay

## 2020-11-09 ENCOUNTER — Encounter: Payer: Self-pay | Admitting: Orthopedic Surgery

## 2020-11-09 ENCOUNTER — Telehealth: Payer: Medicare Other

## 2020-11-09 VITALS — BP 135/74 | HR 86 | Temp 98.0°F | Resp 18

## 2020-11-09 DIAGNOSIS — I129 Hypertensive chronic kidney disease with stage 1 through stage 4 chronic kidney disease, or unspecified chronic kidney disease: Secondary | ICD-10-CM | POA: Diagnosis not present

## 2020-11-09 DIAGNOSIS — N2581 Secondary hyperparathyroidism of renal origin: Secondary | ICD-10-CM | POA: Diagnosis not present

## 2020-11-09 DIAGNOSIS — N184 Chronic kidney disease, stage 4 (severe): Secondary | ICD-10-CM | POA: Diagnosis not present

## 2020-11-09 DIAGNOSIS — E669 Obesity, unspecified: Secondary | ICD-10-CM | POA: Diagnosis not present

## 2020-11-09 DIAGNOSIS — N183 Chronic kidney disease, stage 3 unspecified: Secondary | ICD-10-CM | POA: Insufficient documentation

## 2020-11-09 DIAGNOSIS — D631 Anemia in chronic kidney disease: Secondary | ICD-10-CM | POA: Insufficient documentation

## 2020-11-09 DIAGNOSIS — S78111A Complete traumatic amputation at level between right hip and knee, initial encounter: Secondary | ICD-10-CM

## 2020-11-09 DIAGNOSIS — E876 Hypokalemia: Secondary | ICD-10-CM | POA: Diagnosis not present

## 2020-11-09 LAB — RENAL FUNCTION PANEL
Albumin: 3.5 g/dL (ref 3.5–5.0)
Anion gap: 8 (ref 5–15)
BUN: 62 mg/dL — ABNORMAL HIGH (ref 6–20)
CO2: 24 mmol/L (ref 22–32)
Calcium: 9.2 mg/dL (ref 8.9–10.3)
Chloride: 103 mmol/L (ref 98–111)
Creatinine, Ser: 3.19 mg/dL — ABNORMAL HIGH (ref 0.61–1.24)
GFR, Estimated: 24 mL/min — ABNORMAL LOW (ref >60)
Glucose, Bld: 54 mg/dL — ABNORMAL LOW (ref 70–99)
Phosphorus: 4.9 mg/dL — ABNORMAL HIGH (ref 2.5–4.6)
Potassium: 4.2 mmol/L (ref 3.5–5.1)
Sodium: 135 mmol/L (ref 135–145)

## 2020-11-09 LAB — IRON AND TIBC
Iron: 46 ug/dL (ref 45–182)
Saturation Ratios: 17 % — ABNORMAL LOW (ref 17.9–39.5)
TIBC: 276 ug/dL (ref 250–450)
UIBC: 230 ug/dL

## 2020-11-09 LAB — POCT HEMOGLOBIN-HEMACUE: Hemoglobin: 10.7 g/dL — ABNORMAL LOW (ref 13.0–17.0)

## 2020-11-09 LAB — FERRITIN: Ferritin: 293 ng/mL (ref 24–336)

## 2020-11-09 MED ORDER — DARBEPOETIN ALFA 200 MCG/0.4ML IJ SOSY
PREFILLED_SYRINGE | INTRAMUSCULAR | Status: AC
  Administered 2020-11-09: 200 ug via SUBCUTANEOUS
  Filled 2020-11-09: qty 0.4

## 2020-11-09 MED ORDER — DARBEPOETIN ALFA 200 MCG/0.4ML IJ SOSY: 200.0000 ug | PREFILLED_SYRINGE | INTRAMUSCULAR | Status: DC

## 2020-11-09 NOTE — Progress Notes (Signed)
Office Visit Note   Patient: [name]           Date of Birth: 1975/08/18           MRN: [id number] Visit Date: 11/09/2020              Requested by: [name], MD [address],  [place] [address] PCP: [name], MD  Chief Complaint  Patient presents with   Right Leg - Pain    S/P Right AKA      HPI: Patient is a 45 year old gentleman who is seen for evaluation for a right above-the-knee amputation.  Patient is status post amputation in 2016 he is just received a new prosthesis from [employer].  Assessment & Plan: Visit Diagnoses:  1. Unilateral AKA, right ([name])     Plan: Patient is given a prescription that we will fax to [employer] for new custom shrinker.  Discussed that if the shrinker does not help resolve the redundant lymphedema of the right leg we could proceed with surgical revision.  Discussed that we would do this as an outpatient with overnight observation.  Patient will call if he is unsuccessful with the custom shrinker.  Follow-Up Instructions: Return if symptoms worsen or fail to improve.   Ortho Exam  Patient is alert, oriented, no adenopathy, well-dressed, normal affect, normal respiratory effort. Examination patient has a well-healed above-knee amputation on the right.  There is significant redundant tissue secondary to lymphatic insufficiency.  There is no cellulitis no induration no open ulcers no signs of infection.  He has a new prosthesis.  Imaging: No results found. No images are attached to the encounter.  Labs: Lab Results  Component Value Date   HGBA1C 4.7 (L) 10/02/2020   HGBA1C 5.5 07/08/2020   HGBA1C 6.0 04/15/2020   ESRSEDRATE 72 (H) 06/18/2018   ESRSEDRATE 57 (H) 07/12/2011   CRP 8.07 (H) 07/12/2011   REPTSTATUS 08/22/2020 FINAL 08/20/2020   GRAMSTAIN  12/22/2013    RARE WBC PRESENT, PREDOMINANTLY PMN NO SQUAMOUS EPITHELIAL CELLS SEEN NO ORGANISMS SEEN Performed at [facility]  12/22/2013    RARE WBC PRESENT, PREDOMINANTLY PMN NO SQUAMOUS EPITHELIAL CELLS SEEN NO ORGANISMS SEEN Performed at [facility]    CULT >=100,000 COLONIES/mL KLEBSIELLA PNEUMONIAE (A) 08/20/2020   LABORGA KLEBSIELLA PNEUMONIAE (A) 08/20/2020     Lab Results  Component Value Date   ALBUMIN 3.5 11/09/2020   ALBUMIN 3.4 (L) 10/19/2020   ALBUMIN 2.8 (L) 10/02/2020    Lab Results  Component Value Date   MG 2.1 10/03/2020   MG 2.0 10/02/2020   MG 2.2 08/22/2020   No results found for: VD25OH  No results found for: PREALBUMIN CBC EXTENDED Latest Ref Rng & Units 10/19/2020 10/03/2020 10/02/2020  WBC 4.0 - 10.5 K/uL - 10.3 10.8(H)  RBC 4.22 - 5.81 MIL/uL - 2.84(L) 2.81(L)  HGB 13.0 - 17.0 g/dL 9.6(L) 7.9(L) 7.9(L)  HCT 39.0 - 52.0 % - 25.6(L) 25.6(L)  PLT 150 - 400 K/uL - 289 270  NEUTROABS 1.7 - 7.7 K/uL - - 8.2(H)  LYMPHSABS 0.7 - 4.0 K/uL - - 1.1     There is no height or weight on file to calculate BMI.  Orders:  No orders of the defined types were placed in this encounter.  No orders of the defined types were placed in this encounter.    Procedures: No procedures performed  Clinical Data: No additional findings.  ROS:  All other systems negative, except as noted in the HPI. Review of Systems  Objective: Vital Signs: There were no vitals taken for this visit.  Specialty Comments:  No specialty comments available.  PMFS History: Patient Active Problem List   Diagnosis Date Noted   Acute pulmonary edema (HCC)    Benign paroxysmal positional vertigo 09/14/2020   Anemia associated with chronic renal failure    Hemoglobinuria 12/12/2018   OSA (obstructive sleep apnea) 02/28/2018   Chronic diastolic heart failure ([place]) 02/02/2018   Preventative health care 08/15/2017   Erectile dysfunction 04/22/2017   History of pulmonary embolus (PE) 06/09/2016   Depression 05/04/2016   Hypogonadism in male 05/04/2016   Dyslipidemia 08/12/2014    Unilateral AKA, right ([name]) 12/26/2013   Hyperlipidemia 02/19/2013   S/P cardiac cath, 02/18/13, normal coronaries 02/19/2013   Chronic renal insufficiency, stage 3 (moderate) ([name]) 02/19/2013   Hypertension 07/24/2011   Long term current use of anticoagulant therapy 07/19/2011   History of DVT (deep vein thrombosis) 07/28/2009   Morbid obesity ([name]) 10/26/2005   Insulin dependent type 2 diabetes mellitus, controlled ([name]) 01/09/2002   Past Medical History:  Diagnosis Date   Acute venous embolism and thrombosis of deep vessels of proximal lower extremity ([name]) 07/19/2011   Anemia    Anginal pain (HCC)    pt denies   Anxiety    Chest pain, neg MI, normal coronaries by cath 02/18/2013   pt denies   CHF (congestive heart failure) (HCC)    CKD (chronic kidney disease) stage 3, GFR 30-59 ml/min ([name]) 02/19/2013   Colon polyps    Depression    Diabetic ulcer of right foot ([name])    DVT (deep venous thrombosis) ([name]) 09/2002   patient reports additional DVTs in '06 & '11 (unconfirmed)   ED (erectile dysfunction)    GERD (gastroesophageal reflux disease)    History of blood transfusion    "related to OR" (10/31/2016)   Hyperlipidemia 02/19/2013   Hypertension    Nausea & vomiting    "constant for the last couple weeks" (10/31/2016)   Nephrotic syndrome    Obesity    BMI 44, weight 346 pounds 01/30/14   OSA (obstructive sleep apnea) 02/28/2018   Mild obstructive sleep apnea with an AHI of 9.8/h but severe during REM sleep with an AHI of 33.8/h.  Oxygen saturations dropped to 86% and there was moderate snoring   Peripheral edema    Pneumonia    Prosthesis adjustment 08/17/2016   Pulmonary embolism ([name]) 09/2002   treated with 6 months of warfarin   Pyelonephritis 02/02/2018   Type I diabetes mellitus ([name]) dx'd 2001   Urinary retention     Family History  Problem Relation Age of Onset   Heart disease Mother    Diabetes Mother    Diabetes Maternal Uncle    Diabetes Cousin     Past  Surgical History:  Procedure Laterality Date   AMPUTATION Right 12/22/2013   Procedure: AMPUTATION BELOW KNEE;  Surgeon: [name], MD;  Location: [facility];  Service: Orthopedics;  Laterality: Right;   AMPUTATION Right 02/19/2014   Procedure: RIGHT ABOVE KNEE AMPUTATION ;  Surgeon: [name], MD;  Location: [facility];  Service: Orthopedics;  Laterality: Right;   blood clot removal  2016   CARDIAC CATHETERIZATION  02/18/2013   normal coronaries   ESOPHAGOGASTRODUODENOSCOPY N/A 11/02/2016   Procedure: ESOPHAGOGASTRODUODENOSCOPY (EGD);  Surgeon: [name], MD;  Location: [facility] ENDOSCOPY;  Service: Endoscopy;  Laterality: N/A;  I & D EXTREMITY Right 12/14/2013   Procedure: IRRIGATION AND DEBRIDEMENT RIGHT FOOT;  Surgeon: [name], MD;  Location: [facility];  Service: Orthopedics;  Laterality: Right;   INCISION AND DRAINAGE ABSCESS  2007; 2015   "back"   INCISION AND DRAINAGE OF WOUND Right 12/22/2013   Procedure: I&D RIGHT BUTTOCK;  Surgeon: [name], MD;  Location: [facility];  Service: Orthopedics;  Laterality: Right;   LEFT HEART CATHETERIZATION WITH CORONARY ANGIOGRAM N/A 02/18/2013   Procedure: LEFT HEART CATHETERIZATION WITH CORONARY ANGIOGRAM;  Surgeon: [name], MD;  Location: [facility] CATH LAB;  Service: Cardiovascular;  Laterality: N/A;   Social History   Occupational History   Occupation: student    Comment: [employer]   Occupation: disability  Tobacco Use   Smoking status: Never   Smokeless tobacco: Never  Vaping Use   Vaping Use: Never used  Substance and Sexual Activity   Alcohol use: Yes    Comment: 10/31/2016 "a few beers q 6 months or so"   Drug use: No   Sexual activity: Yes

## 2020-11-11 ENCOUNTER — Other Ambulatory Visit: Payer: Self-pay | Admitting: *Deleted

## 2020-11-11 ENCOUNTER — Ambulatory Visit: Payer: Medicare Other

## 2020-11-11 ENCOUNTER — Other Ambulatory Visit: Payer: Self-pay | Admitting: Student

## 2020-11-11 DIAGNOSIS — S78111A Complete traumatic amputation at level between right hip and knee, initial encounter: Secondary | ICD-10-CM

## 2020-11-11 DIAGNOSIS — E119 Type 2 diabetes mellitus without complications: Secondary | ICD-10-CM

## 2020-11-11 MED ORDER — APIXABAN 5 MG PO TABS: 5.0000 mg | ORAL_TABLET | Freq: Two times a day (BID) | ORAL | 0 refills | Status: DC

## 2020-11-11 NOTE — Telephone Encounter (Signed)
-----   Message from [name], RN sent at 11/11/2020  3:09 PM EDT ----- Regarding: Medication refill Good afternoon,  I just had a follow up call with [name] and he needs a refill on his Eliquis 5mg . His last prescription was on 09/14/20 for 180 tabs and he was taking 1 tab BID.  Can you please submit to his [facility] [place].  If there is an issue please let me know. Thank you, Alycia Rossetti, RN, BSN, [facility] Phone: [phone]: [phone]

## 2020-11-11 NOTE — Chronic Care Management (AMB) (Signed)
Care Management    RN Visit Note  11/11/2020 Name: [name] MRN: [id number] DOB: 12/19/1975  Subjective: [name] is a 45 y.o. year old male who is a primary care patient of [name], MD. The care management team was consulted for assistance with disease management and care coordination needs.    Engaged with patient by telephone for follow up visit in response to provider referral for case management and/or care coordination services.   Consent to Services:   Mr. Forti was given information about Care Management services today including:  Care Management services includes personalized support from designated clinical staff supervised by his physician, including individualized plan of care and coordination with other care providers 24/7 contact phone numbers for assistance for urgent and routine care needs. The patient may stop case management services at any time by phone call to the office staff.  Patient agreed to services and consent obtained.   Assessment: Review of patient past medical history, allergies, medications, health status, including review of consultants reports, laboratory and other test data, was performed as part of comprehensive evaluation and provision of chronic care management services.   SDOH (Social Determinants of Health) assessments and interventions performed:    Care Plan  Allergies  Allergen Reactions   Reglan [Metoclopramide] Other (See Comments)    Dysphoric reaction   Metformin And Related Diarrhea   Ozempic (0.25 Or 0.5 Mg-Dose) [Semaglutide(0.25 Or 0.103m-Dos)] Nausea And Vomiting    Outpatient Encounter Medications as of 11/11/2020  Medication Sig Note   Accu-Chek Softclix Lancets lancets Check blood sugar up to 3 times a day (Patient taking differently: 1 each by Other route See admin instructions. Check blood sugar up to 4-5 times a day)    acidophilus (RISAQUAD) CAPS capsule Take 1 capsule by mouth daily.    Alpha-Lipoic  Acid 600 MG CAPS Take 600 mg by mouth daily.    amLODipine (NORVASC) 10 MG tablet Take 10 mg by mouth at bedtime.    apixaban (ELIQUIS) 5 MG TABS tablet Take 1 tablet (5 mg total) by mouth 2 (two) times daily.    Blood Glucose Monitoring Suppl (ACCU-CHEK GUIDE ME) w/Device KIT Use to check blood sugar up to 3 times a day (Patient taking differently: 1 each by Other route See admin instructions. Use to check blood sugar up to 4-5 times a day)    carvedilol (COREG) 25 MG tablet Take 1 tablet (25 mg total) by mouth 2 (two) times daily with a meal.    Cyanocobalamin (B-12) 500 MCG SUBL Place 500 mcg under the tongue daily.     cyclobenzaprine (FLEXERIL) 5 MG tablet Take 1 tablet (5 mg total) by mouth 3 (three) times daily as needed for muscle spasms.    famotidine (PEPCID) 20 MG tablet Take 1 tablet (20 mg total) by mouth 2 (two) times daily. (Patient taking differently: Take 20 mg by mouth 2 (two) times daily as needed for heartburn.) 11/28/2019: .   fluticasone (FLONASE) 50 MCG/ACT nasal spray Place 2 sprays into both nostrils daily. (Patient taking differently: Place 2 sprays into both nostrils daily as needed for allergies.)    furosemide (LASIX) 80 MG tablet Take 1 tablet by mouth twice daily (Patient taking differently: Take 80 mg by mouth 2 (two) times daily.)    gabapentin (NEURONTIN) 100 MG capsule TAKE 1 CAPSULE BY MOUTH THREE TIMES DAILY    glucose blood (ACCU-CHEK GUIDE) test strip Check blood sugar up to 3 times a day (Patient taking differently:  1 each by Other route See admin instructions. Check blood sugar up to 4-5 times a day)    hydrALAZINE (APRESOLINE) 100 MG tablet Take 100 mg by mouth 3 (three) times daily.    insulin aspart (NOVOLOG) 100 UNIT/ML FlexPen Administer 5 units in the morning with breakfast, 20 units with lunch, and 25 units with dinner daily.  ICD Code:  E11.9 (Patient taking differently: Inject 15-30 Units into the skin 3 (three) times daily with meals. Administer 15  units in the morning with breakfast, 25 units with lunch, and 30 units with dinner daily.  ICD Code:  E11.9)    insulin degludec (TRESIBA FLEXTOUCH) 200 UNIT/ML FlexTouch Pen INJECT 45 UNITS SUBCUTANEOUSLY ONCE DAILY (Patient taking differently: Inject 49 Units into the skin daily.)    Insulin Pen Needle (B-D UF III MINI PEN NEEDLES) 31G X 5 MM MISC 1 each by Other route See admin instructions. USE 4-5 TIMES DAILY    Insulin Pen Needle (PEN NEEDLES 3/16") 31G X 5 MM MISC Use five times daily    Lancet Devices (ACCU-CHEK SOFTCLIX) lancets Use to check blood sugars 5 times a day. Dx code:E11.9. Insulin dependent. (Patient taking differently: 1 each by Other route See admin instructions. Use to check blood sugars 5 times a day. Dx code:E11.9. Insulin dependent.)    lovastatin (MEVACOR) 20 MG tablet Take 1 tablet (20 mg total) by mouth daily.    oxybutynin (DITROPAN) 5 MG tablet Take 1 tablet (5 mg total) by mouth 3 (three) times daily.    polyethylene glycol (MIRALAX / GLYCOLAX) 17 g packet Take 17 g by mouth 2 (two) times daily. (Patient taking differently: Take 17 g by mouth as needed for mild constipation.)    potassium chloride SA (KLOR-CON) 20 MEQ tablet Take 20 mEq by mouth 2 (two) times daily.    senna-docusate (SENOKOT-S) 8.6-50 MG tablet Take 1 tablet by mouth daily. (Patient taking differently: Take 1 tablet by mouth as needed for mild constipation.)    sildenafil (VIAGRA) 50 MG tablet Take 1-2 tablets (50-100 mg total) by mouth as needed for erectile dysfunction. Take 1 hour before needed. Do not take more than 2 tablets at once.    tamsulosin (FLOMAX) 0.4 MG CAPS capsule Take 1 capsule (0.4 mg total) by mouth at bedtime.    VICTOZA 18 MG/3ML SOPN INJECT 1.2 MG INT THE SKIN ONCE DAILY AT THE SAME TIME.    No facility-administered encounter medications on file as of 11/11/2020.    Patient Active Problem List   Diagnosis Date Noted   Acute pulmonary edema (HCC)    Benign paroxysmal  positional vertigo 09/14/2020   Anemia associated with chronic renal failure    Hemoglobinuria 12/12/2018   OSA (obstructive sleep apnea) 02/28/2018   Chronic diastolic heart failure ([place]) 02/02/2018   Preventative health care 08/15/2017   Erectile dysfunction 04/22/2017   History of pulmonary embolus (PE) 06/09/2016   Depression 05/04/2016   Hypogonadism in male 05/04/2016   Dyslipidemia 08/12/2014   Unilateral AKA, right ([place]) 12/26/2013   Hyperlipidemia 02/19/2013   S/P cardiac cath, 02/18/13, normal coronaries 02/19/2013   Chronic renal insufficiency, stage 3 (moderate) ([place]) 02/19/2013   Hypertension 07/24/2011   Long term current use of anticoagulant therapy 07/19/2011   History of DVT (deep vein thrombosis) 07/28/2009   Morbid obesity ([place]) 10/26/2005   Insulin dependent type 2 diabetes mellitus, controlled ([place]) 01/09/2002    Conditions to be addressed/monitored: HTN, DM2  Care Plan : Diabetes Type 2 (Adult)  Updates made by [name], RN since 11/11/2020 12:00 AM     Problem: Currently meeting A1C target but having large fluctuations in blood sugars   Priority: High  Onset Date: 12/15/2019     Goal: Glycemic Management Optimized without large fluctuations in blood sugars   Start Date: 12/15/2019  Expected End Date: 11/07/2020  Recent Progress: On track  Priority: High  Note:   Objective: Successful outreach to patient for follow up.   Patient notes that he is doing well.  The oxygen supplies were picked up by [employer] as he no longer needed them.   He also discussed that he went to see Dr. Sharol Given after he got his new prosthetic as he was having pain in his stump.  Patient noted they discussed doing surgery to revise the stump but he does not want surgery. He doesn't want to delay the progress he is making with his exercise and weight loss.  Dr. Sharol Given put in an order for a stump shrinker and he is hoping that will help with the pain and edema in the stump. Lab Results   Component Value Date   HGBA1C 4.7 (L) 10/02/2020   HGBA1C 5.5 07/08/2020   HGBA1C 6.0 04/15/2020   Lab Results  Component Value Date   MICROALBUR 160.61 (H) 08/14/2013   LDLCALC 52 10/26/2020   CREATININE 3.19 (H) 11/09/2020    Current Barriers:  Knowledge Deficits related to basic Diabetes pathophysiology and self care/management Unable to independently meet pre and post meal CBG targets  Case Manager Clinical Goal(s):  Over the next 30-60 days, patient will demonstrate improved adherence to prescribed treatment plan for diabetes self care/management as evidenced by:  daily monitoring and recording of CBG  adherence to ADA/ carb modified diet exercise 5-7 days/week- adherence to prescribed medication regimen Interventions:  Review of patient status, including review of consultants reports, relevant laboratory and other test results, and medications completed.-Patient noted he needs a refill on his Eliquis 29m.  Message sent to [facility] Red team and triage nurse pool to let them know and request the refill. Assessed satisfaction with [employer] Flash Glucose monitor Assessed frequency of hypoglycemia and ability to self treat Assessed medication taking behavior Patient encouraged to continue his exercise routine with his personal trainer Discussed plans with patient for ongoing care management follow up and provided patient with direct contact information for care management team:  Reviewed scheduled/upcoming provider appointments including:  - barriers to adherence to treatment plan identified - dental care encouraged - mutual A1C goal set or reviewed - devices to improve adherence to care identified - self-awareness of signs/symptoms of hypo or hyperglycemia encouraged Discussed plans with patient for ongoing care management follow up and provided patient with direct contact information for care management team In the next 30-60 days, patient will:  - Self administers oral  medications as prescribed Self administers insulin as prescribed Self administers injectable DM medication Victoza and Tresiba as prescribed Attends all scheduled provider appointments Checks blood sugars as prescribed and utilize hyper and hypoglycemia protocol as needed Adheres to prescribed ADA/carb modified       Plan: Telephone follow up appointment with care management team member scheduled for:  30-45 days  DJohnney Killian RN, BSN, CCM Care Management Coordinator [facility] Phone: [phone]

## 2020-11-11 NOTE — Patient Instructions (Signed)
Visit Information  The patient verbalized understanding of instructions, educational materials, and care plan provided today and declined offer to receive copy of patient instructions, educational materials, and care plan.   Telephone follow up appointment with care management team member scheduled for: 12/16/20@3PM   [name], RN, BSN, CCM Care Management Coordinator [facility] Phone: [phone]: [phone]

## 2020-11-22 ENCOUNTER — Other Ambulatory Visit: Payer: Self-pay | Admitting: Student

## 2020-11-30 ENCOUNTER — Other Ambulatory Visit: Payer: Self-pay

## 2020-11-30 ENCOUNTER — Encounter (HOSPITAL_COMMUNITY)
Admission: RE | Admit: 2020-11-30 | Discharge: 2020-11-30 | Disposition: A | Discharge status: Home / Self Care | Payer: Medicare Other | Source: Ambulatory Visit | Attending: Nephrology | Admitting: Nephrology

## 2020-11-30 VITALS — BP 151/65 | HR 87 | Resp 18

## 2020-11-30 DIAGNOSIS — N183 Chronic kidney disease, stage 3 unspecified: Secondary | ICD-10-CM | POA: Diagnosis not present

## 2020-11-30 DIAGNOSIS — D631 Anemia in chronic kidney disease: Secondary | ICD-10-CM | POA: Diagnosis not present

## 2020-11-30 LAB — RENAL FUNCTION PANEL
Albumin: 3.6 g/dL (ref 3.5–5.0)
Anion gap: 10 (ref 5–15)
BUN: 67 mg/dL — ABNORMAL HIGH (ref 6–20)
CO2: 22 mmol/L (ref 22–32)
Calcium: 8.9 mg/dL (ref 8.9–10.3)
Chloride: 103 mmol/L (ref 98–111)
Creatinine, Ser: 3.3 mg/dL — ABNORMAL HIGH (ref 0.61–1.24)
GFR, Estimated: 23 mL/min — ABNORMAL LOW (ref >60)
Glucose, Bld: 106 mg/dL — ABNORMAL HIGH (ref 70–99)
Phosphorus: 5 mg/dL — ABNORMAL HIGH (ref 2.5–4.6)
Potassium: 4.1 mmol/L (ref 3.5–5.1)
Sodium: 135 mmol/L (ref 135–145)

## 2020-11-30 LAB — IRON AND TIBC
Iron: 54 ug/dL (ref 45–182)
Saturation Ratios: 18 % (ref 17.9–39.5)
TIBC: 300 ug/dL (ref 250–450)
UIBC: 246 ug/dL

## 2020-11-30 LAB — POCT HEMOGLOBIN-HEMACUE: Hemoglobin: 10.6 g/dL — ABNORMAL LOW (ref 13.0–17.0)

## 2020-11-30 LAB — FERRITIN: Ferritin: 277 ng/mL (ref 24–336)

## 2020-11-30 MED ORDER — DARBEPOETIN ALFA 200 MCG/0.4ML IJ SOSY
PREFILLED_SYRINGE | INTRAMUSCULAR | Status: AC
  Administered 2020-11-30: 200 ug via SUBCUTANEOUS
  Filled 2020-11-30: qty 0.4

## 2020-11-30 MED ORDER — DARBEPOETIN ALFA 200 MCG/0.4ML IJ SOSY: 200.0000 ug | PREFILLED_SYRINGE | INTRAMUSCULAR | Status: DC

## 2020-12-06 ENCOUNTER — Other Ambulatory Visit: Payer: Self-pay | Admitting: Internal Medicine

## 2020-12-06 ENCOUNTER — Other Ambulatory Visit: Payer: Self-pay | Admitting: Endocrinology

## 2020-12-06 ENCOUNTER — Other Ambulatory Visit: Payer: Self-pay | Admitting: Student

## 2020-12-06 DIAGNOSIS — I5032 Chronic diastolic (congestive) heart failure: Secondary | ICD-10-CM

## 2020-12-09 DIAGNOSIS — N184 Chronic kidney disease, stage 4 (severe): Secondary | ICD-10-CM | POA: Diagnosis not present

## 2020-12-09 DIAGNOSIS — N2581 Secondary hyperparathyroidism of renal origin: Secondary | ICD-10-CM | POA: Diagnosis not present

## 2020-12-09 DIAGNOSIS — E669 Obesity, unspecified: Secondary | ICD-10-CM | POA: Diagnosis not present

## 2020-12-09 DIAGNOSIS — I129 Hypertensive chronic kidney disease with stage 1 through stage 4 chronic kidney disease, or unspecified chronic kidney disease: Secondary | ICD-10-CM | POA: Diagnosis not present

## 2020-12-09 DIAGNOSIS — E113212 Type 2 diabetes mellitus with mild nonproliferative diabetic retinopathy with macular edema, left eye: Secondary | ICD-10-CM | POA: Diagnosis not present

## 2020-12-09 DIAGNOSIS — E876 Hypokalemia: Secondary | ICD-10-CM | POA: Diagnosis not present

## 2020-12-13 ENCOUNTER — Ambulatory Visit: Payer: Medicare Other

## 2020-12-13 NOTE — Chronic Care Management (AMB) (Signed)
Care Management    RN Visit Note  12/13/2020 Name: [name] MRN: [id number] DOB: 10-04-1975  Subjective: [name] is a 45 y.o. year old male who is a primary care patient of Victor Mercury, MD. The care management team was consulted for assistance with disease management and care coordination needs.    Engaged with patient by telephone for follow up visit in response to provider referral for case management and/or care coordination services.   Consent to Services:   Mr. Diem was given information about Care Management services today including:  Care Management services includes personalized support from designated clinical staff supervised by his physician, including individualized plan of care and coordination with other care providers 24/7 contact phone numbers for assistance for urgent and routine care needs. The patient may stop case management services at any time by phone call to the office staff.  Patient agreed to services and consent obtained.   Assessment: Review of patient past medical history, allergies, medications, health status, including review of consultants reports, laboratory and other test data, was performed as part of comprehensive evaluation and provision of chronic care management services.   SDOH (Social Determinants of Health) assessments and interventions performed:    Care Plan  Allergies  Allergen Reactions   Reglan [Metoclopramide] Other (See Comments)    Dysphoric reaction   Metformin And Related Diarrhea   Ozempic (0.25 Or 0.5 Mg-Dose) [Semaglutide(0.25 Or 0.72m-Dos)] Nausea And Vomiting    Outpatient Encounter Medications as of 12/13/2020  Medication Sig Note   ELIQUIS 5 MG TABS tablet Take 1 tablet by mouth twice daily    Accu-Chek Softclix Lancets lancets Check blood sugar up to 3 times a day (Patient taking differently: 1 each by Other route See admin instructions. Check blood sugar up to 4-5 times a day)    acidophilus (RISAQUAD)  CAPS capsule Take 1 capsule by mouth daily.    Alpha-Lipoic Acid 600 MG CAPS Take 600 mg by mouth daily.    amLODipine (NORVASC) 10 MG tablet Take 10 mg by mouth at bedtime.    Blood Glucose Monitoring Suppl (ACCU-CHEK GUIDE ME) w/Device KIT Use to check blood sugar up to 3 times a day (Patient taking differently: 1 each by Other route See admin instructions. Use to check blood sugar up to 4-5 times a day)    carvedilol (COREG) 25 MG tablet TAKE 1 TABLET BY MOUTH TWICE DAILY WITH  A  MEAL    Cyanocobalamin (B-12) 500 MCG SUBL Place 500 mcg under the tongue daily.     cyclobenzaprine (FLEXERIL) 5 MG tablet Take 1 tablet (5 mg total) by mouth 3 (three) times daily as needed for muscle spasms.    famotidine (PEPCID) 20 MG tablet Take 1 tablet (20 mg total) by mouth 2 (two) times daily. (Patient taking differently: Take 20 mg by mouth 2 (two) times daily as needed for heartburn.) 11/28/2019: .   fluticasone (FLONASE) 50 MCG/ACT nasal spray Place 2 sprays into both nostrils daily. (Patient taking differently: Place 2 sprays into both nostrils daily as needed for allergies.)    furosemide (LASIX) 80 MG tablet Take 1 tablet by mouth twice daily (Patient taking differently: Take 80 mg by mouth 2 (two) times daily.)    gabapentin (NEURONTIN) 100 MG capsule TAKE 1 CAPSULE BY MOUTH THREE TIMES DAILY    glucose blood (ACCU-CHEK GUIDE) test strip Check blood sugar up to 3 times a day (Patient taking differently: 1 each by Other route See admin instructions. Check  blood sugar up to 4-5 times a day)    hydrALAZINE (APRESOLINE) 100 MG tablet Take 100 mg by mouth 3 (three) times daily.    insulin aspart (NOVOLOG) 100 UNIT/ML FlexPen Administer 5 units in the morning with breakfast, 20 units with lunch, and 25 units with dinner daily.  ICD Code:  E11.9 (Patient taking differently: Inject 15-30 Units into the skin 3 (three) times daily with meals. Administer 15 units in the morning with breakfast, 25 units with lunch, and  30 units with dinner daily.  ICD Code:  E11.9)    insulin degludec (TRESIBA FLEXTOUCH) 200 UNIT/ML FlexTouch Pen INJECT 45 UNITS SUBCUTANEOUSLY ONCE DAILY (Patient taking differently: Inject 49 Units into the skin daily.)    Insulin Pen Needle (B-D UF III MINI PEN NEEDLES) 31G X 5 MM MISC 1 each by Other route See admin instructions. USE 4-5 TIMES DAILY    Insulin Pen Needle (PEN NEEDLES 3/16") 31G X 5 MM MISC Use five times daily    Lancet Devices (ACCU-CHEK SOFTCLIX) lancets Use to check blood sugars 5 times a day. Dx code:E11.9. Insulin dependent. (Patient taking differently: 1 each by Other route See admin instructions. Use to check blood sugars 5 times a day. Dx code:E11.9. Insulin dependent.)    lovastatin (MEVACOR) 20 MG tablet Take 1 tablet (20 mg total) by mouth daily.    oxybutynin (DITROPAN) 5 MG tablet TAKE 1 TABLET BY MOUTH THREE TIMES DAILY    polyethylene glycol (MIRALAX / GLYCOLAX) 17 g packet Take 17 g by mouth 2 (two) times daily. (Patient taking differently: Take 17 g by mouth as needed for mild constipation.)    potassium chloride SA (KLOR-CON) 20 MEQ tablet Take 20 mEq by mouth 2 (two) times daily.    senna-docusate (SENOKOT-S) 8.6-50 MG tablet Take 1 tablet by mouth daily. (Patient taking differently: Take 1 tablet by mouth as needed for mild constipation.)    sildenafil (VIAGRA) 50 MG tablet Take 1-2 tablets (50-100 mg total) by mouth as needed for erectile dysfunction. Take 1 hour before needed. Do not take more than 2 tablets at once.    tamsulosin (FLOMAX) 0.4 MG CAPS capsule Take 1 capsule (0.4 mg total) by mouth at bedtime.    VICTOZA 18 MG/3ML SOPN INJECT 1.2 MG INTO THE SKIN ONCE DAILY AT THE SAME TIME. NO FURTHER REFILLS UNTIL PATIENT IS SEEN IN THE OFFICE.    No facility-administered encounter medications on file as of 12/13/2020.    Patient Active Problem List   Diagnosis Date Noted   Acute pulmonary edema (HCC)    Benign paroxysmal positional vertigo 09/14/2020    Anemia associated with chronic renal failure    Hemoglobinuria 12/12/2018   OSA (obstructive sleep apnea) 02/28/2018   Chronic diastolic heart failure ([place]) 02/02/2018   Preventative health care 08/15/2017   Erectile dysfunction 04/22/2017   History of pulmonary embolus (PE) 06/09/2016   Depression 05/04/2016   Hypogonadism in male 05/04/2016   Dyslipidemia 08/12/2014   Unilateral AKA, right ([place]) 12/26/2013   Hyperlipidemia 02/19/2013   S/P cardiac cath, 02/18/13, normal coronaries 02/19/2013   Chronic renal insufficiency, stage 3 (moderate) ([place]) 02/19/2013   Hypertension 07/24/2011   Long term current use of anticoagulant therapy 07/19/2011   History of DVT (deep vein thrombosis) 07/28/2009   Morbid obesity ([place]) 10/26/2005   Insulin dependent type 2 diabetes mellitus, controlled ([place]) 01/09/2002    Conditions to be addressed/monitored: CHF, HTN, and CKD Stage 3  Care Plan : Diabetes Type 2 (  Adult)  Updates made by [name], RN since 12/13/2020 12:00 AM     Problem: Currently meeting A1C target but having large fluctuations in blood sugars   Priority: High  Onset Date: 12/15/2019     Goal: Glycemic Management Optimized without large fluctuations in blood sugars   Start Date: 12/15/2019  Expected End Date: 11/07/2020  Recent Progress: On track  Priority: High  Note:   Objective: Successful outreach to patient for follow up.   Patient is still working on getting his custom shrinker to fit better as he says it needs to be adjusted.  He is going back next week for appointment.  Patient requested this RNCM check into whether his insurance would pay for him to go to [employer].  He is currently using a gym that only has free weights and he wants one with machines.  Enquired with colleague, Milus Height, LSW and she said only plans such as [employer] and [employer] have "Silver Sneakers" programs.  This RNCM contacted [employer] and they do not have any discount  programs for patients with disabilities.  We discussed the new [facility] through [facility] and in calling, there are potential options for the patient.  Provided Mr. Mazor the number for Nicki Reaper [phone] who would meet with the patient, give him a tour and they could discuss cost.  Patient to call. Patient also inquired about getting a travel shower chair.  Contacted [employer] and spoke with Andee Poles who said they have some shower chairs, but the patient would have to disassemble them to make them portable.  Patient to call [employer] to discuss with them. Lab Results  Component Value Date   HGBA1C 4.7 (L) 10/02/2020   HGBA1C 5.5 07/08/2020   HGBA1C 6.0 04/15/2020   Lab Results  Component Value Date   MICROALBUR 160.61 (H) 08/14/2013   LDLCALC 52 10/26/2020   CREATININE 3.30 (H) 11/30/2020    Current Barriers:  Knowledge Deficits related to basic Diabetes pathophysiology and self care/management Unable to independently meet pre and post meal CBG targets  Case Manager Clinical Goal(s):  Over the next 30-60 days, patient will demonstrate improved adherence to prescribed treatment plan for diabetes self care/management as evidenced by:  daily monitoring and recording of CBG  adherence to ADA/ carb modified diet exercise 5-7 days/week- adherence to prescribed medication regimen Interventions:  Review of patient status, including review of consultants reports, relevant laboratory and other test results, and medications completed. Assessed satisfaction with Freestyle Libre Flash Glucose monitor Assessed frequency of hypoglycemia and ability to self treat Assessed medication taking behavior Patient encouraged to continue his exercise routine with his personal trainer Discussed plans with patient for ongoing care management follow up and provided patient with direct contact information for care management team:  Reviewed scheduled/upcoming provider appointments including:  - barriers to adherence  to treatment plan identified - dental care encouraged - mutual A1C goal set or reviewed - devices to improve adherence to care identified - self-awareness of signs/symptoms of hypo or hyperglycemia encouraged Discussed plans with patient for ongoing care management follow up and provided patient with direct contact information for care management team In the next 30-60 days, patient will:  - Self administers oral medications as prescribed Self administers insulin as prescribed Self administers injectable DM medication Victoza and Tresiba as prescribed Attends all scheduled provider appointments Checks blood sugars as prescribed and utilize hyper and hypoglycemia protocol as needed Adheres to prescribed ADA/carb modified Follow Up Plan: The care management team  will reach out to the patient again over the next 30-45 days.      Care Plan : General Plan of Care (Adult)- DME needs of new prostheiss and quad cane and w/c that will enablepatient ot play w/c basketball  Updates made by [name], RN since 12/13/2020 12:00 AM     Problem: DME needs   Priority: High  Onset Date: 06/29/2020     Goal: Patient will have DME needs met   Start Date: 06/29/2020  Expected End Date: 10/08/2020  Recent Progress: On track  Priority: High  Note:   Current Barriers:  Care Coordination needs related to needed DME/Supplies in a patient with IDDM, HF, HLD, HTN, CKD, s/p R AKA 02/20/14 Care Coordination needs related to DME needs in a patient with IDDM, HF, HLD, HTN, CKD, s/p R AKA 02/20/14  Nurse Case Manager Clinical Goal(s):  Over the next 30-90 days, patient will verbalize understanding of plan to obtain needed DME.  Over the next 30-90 days, patient will work with care guide and/or [employer]  and the [employer] to address needs related to DME needs.  Interventions:  Inter-disciplinary care team collaboration (see longitudinal plan of care) Collaboration with/confirmation of  receipt of DME orders at [employer]  and the [place] patient to call the [employer] again to request grant information for basketball w/c- Patient notes that he has called several times to the [employer] and he has not heard back from them. Collaborated with [employer] representatives  regarding securing portable shower chair for patient to use when travelling. - Patient to call [employer] to discuss the types they have. collaboration with PCP regarding development and update of comprehensive plan of care as evidenced by provider attestation and co-signature Inter-disciplinary care team collaboration (see longitudinal plan of care) Patient Self Care Activities:  Patient will self administer medications as prescribed Patient will attend all scheduled provider appointments Patient will call pharmacy for medication refills Patient will call provider office for new concerns or questions Patient will follow up on referrals provided to secure specialized w/c  Unable to independently secure DME  Initial goal documentation      Plan: The patient has been provided with contact information for the care management team and has been advised to call with any health related questions or concerns.   [name], RN, BSN, CCM Care Management Coordinator [facility] Internal Medicine Phone: [phone]: [phone]

## 2020-12-13 NOTE — Patient Instructions (Signed)
Visit Information  Thank you for taking time to visit with me today. Please don't hesitate to contact me if I can be of assistance to you before our next scheduled telephone appointment.  Our next appointment is by telephone on 02/07/21 at [facility]  Please call the care guide team at [phone] if you need to cancel or reschedule your appointment.   If you are experiencing a Mental Health or [place] or need someone to talk to, please call the [place] National Suicide Prevention Lifeline: (959) 490-4972 or TTY: 425-745-1569 TTY 306 079 7892) to talk to a trained counselor   The patient verbalized understanding of instructions, educational materials, and care plan provided today and declined offer to receive copy of patient instructions, educational materials, and care plan.   The patient has been provided with contact information for the care management team and has been advised to call with any health related questions or concerns.   [name], RN, BSN, CCM Care Management Coordinator [facility] Internal Medicine Phone: [phone]: [phone]

## 2020-12-16 ENCOUNTER — Telehealth: Payer: Medicare Other

## 2020-12-21 ENCOUNTER — Other Ambulatory Visit: Payer: Self-pay

## 2020-12-21 ENCOUNTER — Ambulatory Visit (HOSPITAL_COMMUNITY)
Admission: RE | Admit: 2020-12-21 | Discharge: 2020-12-21 | Disposition: A | Discharge status: Home / Self Care | Payer: Medicare Other | Source: Ambulatory Visit | Attending: Nephrology | Admitting: Nephrology

## 2020-12-21 VITALS — BP 138/79 | HR 87 | Temp 97.6°F | Resp 18

## 2020-12-21 DIAGNOSIS — N183 Chronic kidney disease, stage 3 unspecified: Secondary | ICD-10-CM | POA: Insufficient documentation

## 2020-12-21 DIAGNOSIS — D631 Anemia in chronic kidney disease: Secondary | ICD-10-CM | POA: Insufficient documentation

## 2020-12-21 LAB — IRON AND TIBC
Iron: 59 ug/dL (ref 45–182)
Saturation Ratios: 19 % (ref 17.9–39.5)
TIBC: 312 ug/dL (ref 250–450)
UIBC: 253 ug/dL

## 2020-12-21 LAB — RENAL FUNCTION PANEL
Albumin: 3.4 g/dL — ABNORMAL LOW (ref 3.5–5.0)
Anion gap: 9 (ref 5–15)
BUN: 70 mg/dL — ABNORMAL HIGH (ref 6–20)
CO2: 24 mmol/L (ref 22–32)
Calcium: 9.3 mg/dL (ref 8.9–10.3)
Chloride: 103 mmol/L (ref 98–111)
Creatinine, Ser: 3.38 mg/dL — ABNORMAL HIGH (ref 0.61–1.24)
GFR, Estimated: 22 mL/min — ABNORMAL LOW (ref >60)
Glucose, Bld: 157 mg/dL — ABNORMAL HIGH (ref 70–99)
Phosphorus: 5 mg/dL — ABNORMAL HIGH (ref 2.5–4.6)
Potassium: 4.3 mmol/L (ref 3.5–5.1)
Sodium: 136 mmol/L (ref 135–145)

## 2020-12-21 LAB — FERRITIN: Ferritin: 301 ng/mL (ref 24–336)

## 2020-12-21 LAB — POCT HEMOGLOBIN-HEMACUE: Hemoglobin: 10.2 g/dL — ABNORMAL LOW (ref 13.0–17.0)

## 2020-12-21 MED ORDER — DARBEPOETIN ALFA 200 MCG/0.4ML IJ SOSY
PREFILLED_SYRINGE | INTRAMUSCULAR | Status: AC
  Filled 2020-12-21: qty 0.4

## 2020-12-21 MED ORDER — DARBEPOETIN ALFA 200 MCG/0.4ML IJ SOSY
200.0000 ug | PREFILLED_SYRINGE | INTRAMUSCULAR | Status: DC
  Administered 2020-12-21: 200 ug via SUBCUTANEOUS

## 2020-12-22 LAB — PTH, INTACT AND CALCIUM
Calcium, Total (PTH): 9.3 mg/dL (ref 8.7–10.2)
PTH: 136 pg/mL — ABNORMAL HIGH (ref 15–65)

## 2020-12-26 ENCOUNTER — Other Ambulatory Visit: Payer: Self-pay | Admitting: Student

## 2020-12-26 ENCOUNTER — Other Ambulatory Visit: Payer: Self-pay | Admitting: Internal Medicine

## 2020-12-28 ENCOUNTER — Other Ambulatory Visit: Payer: Self-pay

## 2020-12-28 ENCOUNTER — Ambulatory Visit (INDEPENDENT_AMBULATORY_CARE_PROVIDER_SITE_OTHER): Payer: Medicare Other | Admitting: Internal Medicine

## 2020-12-28 ENCOUNTER — Encounter: Payer: Self-pay | Admitting: Internal Medicine

## 2020-12-28 VITALS — BP 145/68 | HR 82 | Temp 97.7°F | Ht 74.0 in | Wt 341.0 lb

## 2020-12-28 DIAGNOSIS — E1165 Type 2 diabetes mellitus with hyperglycemia: Secondary | ICD-10-CM | POA: Diagnosis not present

## 2020-12-28 DIAGNOSIS — E119 Type 2 diabetes mellitus without complications: Secondary | ICD-10-CM

## 2020-12-28 DIAGNOSIS — M62838 Other muscle spasm: Secondary | ICD-10-CM | POA: Diagnosis not present

## 2020-12-28 DIAGNOSIS — Z794 Long term (current) use of insulin: Secondary | ICD-10-CM

## 2020-12-28 DIAGNOSIS — Z0000 Encounter for general adult medical examination without abnormal findings: Secondary | ICD-10-CM

## 2020-12-28 DIAGNOSIS — M25512 Pain in left shoulder: Secondary | ICD-10-CM | POA: Diagnosis not present

## 2020-12-28 DIAGNOSIS — Z23 Encounter for immunization: Secondary | ICD-10-CM | POA: Diagnosis not present

## 2020-12-28 LAB — GLUCOSE, CAPILLARY: Glucose-Capillary: 108 mg/dL — ABNORMAL HIGH (ref 70–99)

## 2020-12-28 LAB — POCT GLYCOSYLATED HEMOGLOBIN (HGB A1C): Hemoglobin A1C: 5.5 % (ref 4.0–5.6)

## 2020-12-28 MED ORDER — CYCLOBENZAPRINE HCL 5 MG PO TABS: 5.0000 mg | ORAL_TABLET | Freq: Every day | ORAL | 0 refills | Status: AC

## 2020-12-28 NOTE — Patient Instructions (Addendum)
[name], it was a pleasure seeing you today! You endorsed feeling well today. Below are some of the things we talked about this visit. We look forward to seeing you in the follow up appointment!  Today we discussed: Shoulder Pain: You presented with shoulder pain after exercising at the gym. I recommend using tylenol 1000 mg three times a day on a scheduled basis until this resolves. I also encourage you to continue using heat and cold pack in 20 minutes interval throughout the day on the left shoulder. You will benefit from using Bengay muscle rub in your shoulder to help with the pain and tension. Please stay away from NSAIDs like aleve, ibuprofen as it can negatively affect your kidneys. We will order a muscle relaxer to help you with the pain.   Diabetes: Your diabetes appears to be well controlled. You endorsed some hypoglycemic episodes during your workout. You endorsed using 49 of treshiba, I would like for you to start using 45 units of that. I would advise you to use the treshiba after you workout to prevent hypoglycemic episodes.   You received a pneumonia shot today. I will also make an eye referral for you to have your eyes checked.   I have ordered the following labs today:  Lab Orders         Glucose, capillary         POC Hbg A1C       Referrals ordered today:   Referral Orders         Ambulatory referral to Ophthalmology       I have ordered the following medication/changed the following medications:   Stop the following medications: Medications Discontinued During This Encounter  Medication Reason   cyclobenzaprine (FLEXERIL) 5 MG tablet      Start the following medications: Meds ordered this encounter  Medications   cyclobenzaprine (FLEXERIL) 5 MG tablet    Sig: Take 1 tablet (5 mg total) by mouth at bedtime for 10 days.    Dispense:  10 tablet    Refill:  0     Follow-up: 1 month follow up with Dr. Gilford Rile  Please make sure to arrive 15 minutes  prior to your next appointment. If you arrive late, you may be asked to reschedule.   We look forward to seeing you next time. Please call our clinic at [phone] if you have any questions or concerns. The best time to call is Monday-Friday from 9am-4pm, but there is someone available 24/7. If after hours or the weekend, call the main hospital number and ask for the Internal Medicine Resident On-Call. If you need medication refills, please notify your pharmacy one week in advance and they will send [place] a request.  Thank you for letting us take part in your care. Wishing you the best!  Thank you, Idamae Schuller, MD

## 2020-12-28 NOTE — Progress Notes (Signed)
° °  CC: shoulder pain  HPI:  [name] is a 45 y.o. with medical history as below presenting to [facility] for shoulder pain.  Please see problem-based list for further details, assessments, and plans.  Past Medical History:  Diagnosis Date   Acute venous embolism and thrombosis of deep vessels of proximal lower extremity (HCC) 07/19/2011   Anemia    Anginal pain (HCC)    pt denies   Anxiety    Chest pain, neg MI, normal coronaries by cath 02/18/2013   pt denies   CHF (congestive heart failure) (HCC)    CKD (chronic kidney disease) stage 3, GFR 30-59 ml/min ([place]) 02/19/2013   Colon polyps    Depression    Diabetic ulcer of right foot ([place])    DVT (deep venous thrombosis) ([place]) 09/2002   patient reports additional DVTs in '06 & '11 (unconfirmed)   ED (erectile dysfunction)    GERD (gastroesophageal reflux disease)    History of blood transfusion    "related to OR" (10/31/2016)   Hyperlipidemia 02/19/2013   Hypertension    Nausea & vomiting    "constant for the last couple weeks" (10/31/2016)   Nephrotic syndrome    Obesity    BMI 44, weight 346 pounds 01/30/14   OSA (obstructive sleep apnea) 02/28/2018   Mild obstructive sleep apnea with an AHI of 9.8/h but severe during REM sleep with an AHI of 33.8/h.  Oxygen saturations dropped to 86% and there was moderate snoring   Peripheral edema    Pneumonia    Prosthesis adjustment 08/17/2016   Pulmonary embolism ([place]) 09/2002   treated with 6 months of warfarin   Pyelonephritis 02/02/2018   Type I diabetes mellitus ([place]) dx'd 2001   Urinary retention    Review of Systems:  Review of system negative unless stated in the problem list or HPI.    Physical Exam:  Vitals:   12/28/20 0907 12/28/20 0944  BP: (!) 145/67 (!) 145/68  Pulse: 83 82  Temp: 97.7 F (36.5 C)   TempSrc: Oral   SpO2: 97%   Weight: (!) 341 lb (154.7 kg)   Height: 6\' 2"  (1.88 m)     Physical Exam General: Well-developed, NAD Head: Normocephalic without  scalp lesions.  Eyes: PERRLA, Conjunctivae pink, sclerae white, without icterus.  Mouth and Throat: Lips normal color, without lesions. Moist mucus membrane. Neck: Neck supple with full range of motion (ROM). TTP starting at lateral base of neck and superior part of his shoulder with increased tension present.  Lungs: CTAB, no wheeze, rhonchi or rales.  Cardiovascular: Normal heart sounds, no r/m/g, 2+ pulses in all extremities. No LE edema Abdomen: No TTP, normal bowel sounds MSK: No asymmetry or muscle atrophy. Full range of motion (ROM) of all joints.  Pain on extreme ROM. TTP on base of lateral neck. Positive hawkins and negative neers. Positive empty cans test (pain present). Strength 5/5 in all upper extremities. Full grip. Neurovascularly intact. Right AKA.  Skin: warm, dry good skin turgor, no lesions noted Neuro: Alert and oriented. CN grossly intact Psych: Normal mood and normal affect   Assessment & Plan:   See Encounters Tab for problem based charting.  Patient seen with Dr.  Idamae Schuller, MD

## 2020-12-30 DIAGNOSIS — M25512 Pain in left shoulder: Secondary | ICD-10-CM | POA: Insufficient documentation

## 2020-12-30 NOTE — Assessment & Plan Note (Signed)
Assessment/Plan: Patient has shoulder injury that appear 2/2 to exertion. Rotator cuff tendonapathy vs muscle spasm. It appears in this case patient may have muscle strain resulting in some tendonapathy. Patient has rested the shoulder joint minimally and has performed resistance training utilizing the shoulder joint yesterday. Counseled on ROM exercises but avoiding resistance exercises. Advised continued use of heat and ice. Avoiding NSIADs and using scheduled tylenol up to 3000 mg/day. Using Bengay muscle rub and will do short course of Flexeril for muscle spasm.  -Rest from resistant training. Light activity tolerated.   -Scheduled tylenol -Bengay muscle rub -Flexeril 5 mg nightly for 10 days

## 2020-12-30 NOTE — Assessment & Plan Note (Signed)
Assessment/Plan: HR: Victoza 1.6 mg daily, Treshiba 49 units daily, and novolog 15-25 with lunch and dinner. 82 percent in control. A1c was 4.7 last visit and 5.5 this visit. Follows with endocrinology. With lower A1c, patient may be at risk for hypoglycemia. He endorsed 4 episodes of hypoglycemia last week with glucose in the 50-60s. All of these occurred while the patient was in the gym working out. Advised patient to use 45 units of long acting insulin as prescribed as the patient was using 49 units. Also advised him to use this after his workout as he was using this before working out in the morning. Advised on eating before working out to ensure, he does not become hypoglycemic.  -Lantus 45 units after morning workouts (patient was injecting 49 units daily) -Eating before workouts to ensure normoglycemia as all hypoglycemic episodes are occurring during this period.  -Novolog with meals -Continue to follow with endocrinology

## 2020-12-30 NOTE — Assessment & Plan Note (Signed)
Pneumonia vaccine today

## 2021-01-04 NOTE — Progress Notes (Signed)
Internal Medicine Clinic Attending  I saw and evaluated the patient.  I personally confirmed the key portions of the history and exam documented by Dr. Humphrey Rolls and I reviewed pertinent patient test results.  The assessment, diagnosis, and plan were formulated together and I agree with the documentation in the residents note.  Acute L shoulder pain developed when doing overhead triceps extensions at the gym.  He now has secondary spasm of trapezius.  He does have FROM at the shoulder and elbow.  Rest, ice are important, as well as ROM exercise when acute pain improves.

## 2021-01-10 ENCOUNTER — Other Ambulatory Visit: Payer: Self-pay | Admitting: Endocrinology

## 2021-01-11 ENCOUNTER — Encounter (HOSPITAL_COMMUNITY)
Admission: RE | Admit: 2021-01-11 | Discharge: 2021-01-11 | Disposition: A | Discharge status: Home / Self Care | Payer: Medicare Other | Source: Ambulatory Visit | Attending: Nephrology | Admitting: Nephrology

## 2021-01-11 VITALS — BP 144/68 | HR 83 | Temp 96.8°F

## 2021-01-11 DIAGNOSIS — N184 Chronic kidney disease, stage 4 (severe): Secondary | ICD-10-CM | POA: Diagnosis not present

## 2021-01-11 DIAGNOSIS — E669 Obesity, unspecified: Secondary | ICD-10-CM | POA: Diagnosis not present

## 2021-01-11 DIAGNOSIS — N183 Chronic kidney disease, stage 3 unspecified: Secondary | ICD-10-CM | POA: Insufficient documentation

## 2021-01-11 DIAGNOSIS — E876 Hypokalemia: Secondary | ICD-10-CM | POA: Diagnosis not present

## 2021-01-11 DIAGNOSIS — I129 Hypertensive chronic kidney disease with stage 1 through stage 4 chronic kidney disease, or unspecified chronic kidney disease: Secondary | ICD-10-CM | POA: Diagnosis not present

## 2021-01-11 DIAGNOSIS — D631 Anemia in chronic kidney disease: Secondary | ICD-10-CM | POA: Diagnosis not present

## 2021-01-11 DIAGNOSIS — N2581 Secondary hyperparathyroidism of renal origin: Secondary | ICD-10-CM | POA: Diagnosis not present

## 2021-01-11 LAB — RENAL FUNCTION PANEL
Albumin: 3.4 g/dL — ABNORMAL LOW (ref 3.5–5.0)
Anion gap: 11 (ref 5–15)
BUN: 85 mg/dL — ABNORMAL HIGH (ref 6–20)
CO2: 22 mmol/L (ref 22–32)
Calcium: 8.7 mg/dL — ABNORMAL LOW (ref 8.9–10.3)
Chloride: 102 mmol/L (ref 98–111)
Creatinine, Ser: 4.44 mg/dL — ABNORMAL HIGH (ref 0.61–1.24)
GFR, Estimated: 16 mL/min — ABNORMAL LOW (ref >60)
Glucose, Bld: 94 mg/dL (ref 70–99)
Phosphorus: 4.6 mg/dL (ref 2.5–4.6)
Potassium: 4.2 mmol/L (ref 3.5–5.1)
Sodium: 135 mmol/L (ref 135–145)

## 2021-01-11 LAB — IRON AND TIBC
Iron: 71 ug/dL (ref 45–182)
Saturation Ratios: 23 % (ref 17.9–39.5)
TIBC: 309 ug/dL (ref 250–450)
UIBC: 238 ug/dL

## 2021-01-11 LAB — POCT HEMOGLOBIN-HEMACUE: Hemoglobin: 10 g/dL — ABNORMAL LOW (ref 13.0–17.0)

## 2021-01-11 LAB — FERRITIN: Ferritin: 231 ng/mL (ref 24–336)

## 2021-01-11 MED ORDER — DARBEPOETIN ALFA 200 MCG/0.4ML IJ SOSY: 200.0000 ug | PREFILLED_SYRINGE | INTRAMUSCULAR | Status: DC

## 2021-01-11 MED ORDER — DARBEPOETIN ALFA 200 MCG/0.4ML IJ SOSY
PREFILLED_SYRINGE | INTRAMUSCULAR | Status: AC
  Administered 2021-01-11: 200 ug via SUBCUTANEOUS
  Filled 2021-01-11: qty 0.4

## 2021-01-21 DIAGNOSIS — E113312 Type 2 diabetes mellitus with moderate nonproliferative diabetic retinopathy with macular edema, left eye: Secondary | ICD-10-CM | POA: Diagnosis not present

## 2021-01-25 ENCOUNTER — Encounter: Payer: Self-pay | Admitting: Dietician

## 2021-01-31 ENCOUNTER — Other Ambulatory Visit: Payer: Self-pay

## 2021-01-31 ENCOUNTER — Ambulatory Visit (INDEPENDENT_AMBULATORY_CARE_PROVIDER_SITE_OTHER): Payer: Medicare Other | Admitting: Internal Medicine

## 2021-01-31 ENCOUNTER — Encounter: Payer: Self-pay | Admitting: Internal Medicine

## 2021-01-31 VITALS — BP 135/72 | HR 70 | Temp 98.0°F | Resp 16 | Ht 74.0 in | Wt 339.9 lb

## 2021-01-31 DIAGNOSIS — S46812A Strain of other muscles, fascia and tendons at shoulder and upper arm level, left arm, initial encounter: Secondary | ICD-10-CM | POA: Diagnosis not present

## 2021-01-31 DIAGNOSIS — Z794 Long term (current) use of insulin: Secondary | ICD-10-CM

## 2021-01-31 DIAGNOSIS — N390 Urinary tract infection, site not specified: Secondary | ICD-10-CM | POA: Diagnosis not present

## 2021-01-31 DIAGNOSIS — E119 Type 2 diabetes mellitus without complications: Secondary | ICD-10-CM | POA: Diagnosis not present

## 2021-01-31 DIAGNOSIS — N3000 Acute cystitis without hematuria: Secondary | ICD-10-CM

## 2021-01-31 LAB — POCT URINALYSIS DIPSTICK
Bilirubin, UA: NEGATIVE
Blood, UA: NEGATIVE
Glucose, UA: NEGATIVE
Ketones, UA: NEGATIVE
Nitrite, UA: NEGATIVE
Protein, UA: POSITIVE — AB
Spec Grav, UA: 1.015 (ref 1.010–1.025)
Urobilinogen, UA: 0.2 E.U./dL
pH, UA: 6 (ref 5.0–8.0)

## 2021-01-31 MED ORDER — TAMSULOSIN HCL 0.4 MG PO CAPS: 0.4000 mg | ORAL_CAPSULE | Freq: Every day | ORAL | 3 refills | Status: DC

## 2021-01-31 MED ORDER — GABAPENTIN 100 MG PO CAPS: 100.0000 mg | ORAL_CAPSULE | Freq: Three times a day (TID) | ORAL | 3 refills | Status: DC

## 2021-01-31 MED ORDER — CYCLOBENZAPRINE HCL 5 MG PO TABS: 5.0000 mg | ORAL_TABLET | Freq: Every evening | ORAL | 0 refills | Status: DC | PRN

## 2021-01-31 MED ORDER — OXYBUTYNIN CHLORIDE 5 MG PO TABS: 5.0000 mg | ORAL_TABLET | Freq: Three times a day (TID) | ORAL | 0 refills | Status: DC

## 2021-01-31 MED ORDER — DICLOFENAC SODIUM 1 % EX GEL: 2.0000 g | Freq: Four times a day (QID) | CUTANEOUS | 3 refills | Status: AC

## 2021-01-31 NOTE — Progress Notes (Signed)
CC: L shoulder pain, Concern for UTI  HPI:  [name] is a 46 y.o. person, with a PMH noted below, who presents to the clinic L shoulder pain and concern for UTI. To see the management of their acute and chronic conditions, please see the A&P note under the Encounters tab.   Past Medical History:  Diagnosis Date   Acute venous embolism and thrombosis of deep vessels of proximal lower extremity (HCC) 07/19/2011   Anemia    Anginal pain (HCC)    pt denies   Anxiety    Chest pain, neg MI, normal coronaries by cath 02/18/2013   pt denies   CHF (congestive heart failure) (HCC)    CKD (chronic kidney disease) stage 3, GFR 30-59 ml/min ([place]) 02/19/2013   Colon polyps    Depression    Diabetic ulcer of right foot ([place])    DVT (deep venous thrombosis) ([place]) 09/2002   patient reports additional DVTs in '06 & '11 (unconfirmed)   ED (erectile dysfunction)    GERD (gastroesophageal reflux disease)    History of blood transfusion    "related to OR" (10/31/2016)   Hyperlipidemia 02/19/2013   Hypertension    Nausea & vomiting    "constant for the last couple weeks" (10/31/2016)   Nephrotic syndrome    Obesity    BMI 44, weight 346 pounds 01/30/14   OSA (obstructive sleep apnea) 02/28/2018   Mild obstructive sleep apnea with an AHI of 9.8/h but severe during REM sleep with an AHI of 33.8/h.  Oxygen saturations dropped to 86% and there was moderate snoring   Peripheral edema    Pneumonia    Prosthesis adjustment 08/17/2016   Pulmonary embolism ([place]) 09/2002   treated with 6 months of warfarin   Pyelonephritis 02/02/2018   Type I diabetes mellitus ([place]) dx'd 2001   Urinary retention    Review of Systems:   Review of Systems  Constitutional:  Negative for chills, diaphoresis, fever, malaise/fatigue and weight loss.  Gastrointestinal:  Negative for abdominal pain, constipation, diarrhea, nausea and vomiting.  Genitourinary:  Negative for dysuria, flank pain, frequency, hematuria and  urgency.  Skin:  Negative for itching and rash.    Physical Exam:  Vitals:   01/31/21 0905  BP: 135/72  Pulse: 70  Resp: 16  Temp: 98 F (36.7 C)  TempSrc: Oral  SpO2: 98%  Weight: (!) 339 lb 14.4 oz (154.2 kg)  Height: 6\' 2"  (1.88 m)   Physical Exam Constitutional:      General: He is not in acute distress.    Appearance: Normal appearance. He is not ill-appearing, toxic-appearing or diaphoretic.  HENT:     Head: Normocephalic and atraumatic.  Cardiovascular:     Rate and Rhythm: Normal rate and regular rhythm.     Pulses: Normal pulses.     Heart sounds: No murmur heard.   No friction rub. No gallop.  Pulmonary:     Effort: Pulmonary effort is normal. No respiratory distress.     Breath sounds: Normal breath sounds. No wheezing.  Musculoskeletal:     Comments: R AKA Tenderness to the L trapezius on palpation, L shoulder exam otherwise wnl  Neurological:     General: No focal deficit present.     Mental Status: He is alert and oriented to person, place, and time.  Psychiatric:        Mood and Affect: Mood normal.        Behavior: Behavior normal.  Assessment & Plan:   See Encounters Tab for problem based charting.  Patient discussed with Dr. Philipp Ovens

## 2021-01-31 NOTE — Patient Instructions (Addendum)
To Mr. Barbeau,   It was a pleasure seeing you today! Today we discussed your your urinary symptoms and left shoulder pain.   For your urinary symptoms, we are going to culture your urine to ensure there is no active infection. I will call you with the results.   For your shoulder pain, you have a trapezius spasm. Please use Voltaren gel up to 4 times daily as needed for pain, and Flexeril 5 mg nightly for your spasm. We will see you back in 4 weeks to reassess your shoulder.   I have additionally refilled your gabapentin and tamsulosin today.  We will see you in 4 weeks! [name], MD

## 2021-02-01 ENCOUNTER — Encounter (HOSPITAL_COMMUNITY)
Admission: RE | Admit: 2021-02-01 | Discharge: 2021-02-01 | Disposition: A | Discharge status: Home / Self Care | Payer: Medicare Other | Source: Ambulatory Visit | Attending: Nephrology | Admitting: Nephrology

## 2021-02-01 ENCOUNTER — Encounter: Payer: Self-pay | Admitting: Nephrology

## 2021-02-01 VITALS — BP 154/80 | HR 87 | Temp 97.2°F | Resp 18

## 2021-02-01 DIAGNOSIS — N183 Chronic kidney disease, stage 3 unspecified: Secondary | ICD-10-CM

## 2021-02-01 DIAGNOSIS — D631 Anemia in chronic kidney disease: Secondary | ICD-10-CM | POA: Diagnosis not present

## 2021-02-01 LAB — URINALYSIS, ROUTINE W REFLEX MICROSCOPIC
Bilirubin, UA: NEGATIVE
Glucose, UA: NEGATIVE
Ketones, UA: NEGATIVE
Leukocytes,UA: NEGATIVE
Nitrite, UA: NEGATIVE
RBC, UA: NEGATIVE
Specific Gravity, UA: 1.009 (ref 1.005–1.030)
Urobilinogen, Ur: 0.2 mg/dL (ref 0.2–1.0)
pH, UA: 5.5 (ref 5.0–7.5)

## 2021-02-01 LAB — MICROSCOPIC EXAMINATION
Casts: NONE SEEN /lpf
Epithelial Cells (non renal): NONE SEEN /hpf (ref 0–10)
RBC, Urine: NONE SEEN /hpf (ref 0–2)

## 2021-02-01 LAB — RENAL FUNCTION PANEL
Albumin: 3.6 g/dL (ref 3.5–5.0)
Anion gap: 13 (ref 5–15)
BUN: 67 mg/dL — ABNORMAL HIGH (ref 6–20)
CO2: 24 mmol/L (ref 22–32)
Calcium: 9.2 mg/dL (ref 8.9–10.3)
Chloride: 98 mmol/L (ref 98–111)
Creatinine, Ser: 3.31 mg/dL — ABNORMAL HIGH (ref 0.61–1.24)
GFR, Estimated: 23 mL/min — ABNORMAL LOW (ref >60)
Glucose, Bld: 46 mg/dL — ABNORMAL LOW (ref 70–99)
Phosphorus: 4 mg/dL (ref 2.5–4.6)
Potassium: 4.2 mmol/L (ref 3.5–5.1)
Sodium: 135 mmol/L (ref 135–145)

## 2021-02-01 LAB — IRON AND TIBC
Iron: 67 ug/dL (ref 45–182)
Saturation Ratios: 22 % (ref 17.9–39.5)
TIBC: 307 ug/dL (ref 250–450)
UIBC: 240 ug/dL

## 2021-02-01 LAB — FERRITIN: Ferritin: 318 ng/mL (ref 24–336)

## 2021-02-01 LAB — POCT HEMOGLOBIN-HEMACUE: Hemoglobin: 10.6 g/dL — ABNORMAL LOW (ref 13.0–17.0)

## 2021-02-01 MED ORDER — DARBEPOETIN ALFA 200 MCG/0.4ML IJ SOSY
200.0000 ug | PREFILLED_SYRINGE | INTRAMUSCULAR | Status: DC
  Administered 2021-02-01: 09:00:00 200 ug via SUBCUTANEOUS

## 2021-02-01 MED ORDER — DARBEPOETIN ALFA 200 MCG/0.4ML IJ SOSY
PREFILLED_SYRINGE | INTRAMUSCULAR | Status: AC
  Filled 2021-02-01: qty 0.4

## 2021-02-02 DIAGNOSIS — S46812A Strain of other muscles, fascia and tendons at shoulder and upper arm level, left arm, initial encounter: Secondary | ICD-10-CM | POA: Insufficient documentation

## 2021-02-02 LAB — URINE CULTURE

## 2021-02-02 NOTE — Assessment & Plan Note (Signed)
Patient presents to the clinic with left shoulder pain after increasing his weights during working out at the gym. He states that several hours after working out, he noted the pain. He has not tried anything for the pain at this time. During physical examination, he has tenderness to palpation of the L trapezius muscle, he has full ROM of the L shoulder. Will treat for trapezius strain.  - Flexeril 5 mg at night PRN - Voltaren gel 1%

## 2021-02-02 NOTE — Assessment & Plan Note (Addendum)
Patient with a Hx of UTIs presents to the clinic for concern for a possible UTI. His symptoms began on Friday when he noted malodorous smell from his urine that smelt of blood. Urine was normal in color at the time. He began to drink cranberry juice daily to wash his kidneys. He denies any urinary frequency or hesitancy. He is unsure if dysuria is present. He states that the smell of blood has dissipated when he voids.   A/P:  Patient presents with concerns for UTI, he does follow with urology for his recurrent infections. Dipstick today shows some protein, but is negative. While history and results of dipstick he is considered a complicated given his sex. Discussed that UTIs in men are complicated with the patient and he voices understanding. Will obtain official urinalysis with culture. Given the patient's kidney disease he would like to wait on antibiotics until the culture comes back.  - U/A w/ reflex microscopy - Urine culture  Addendum:  Updated patient on results

## 2021-02-04 ENCOUNTER — Other Ambulatory Visit: Payer: Medicare Other

## 2021-02-07 ENCOUNTER — Ambulatory Visit: Payer: Medicare Other

## 2021-02-07 NOTE — Patient Instructions (Signed)
Visit Information  Thank you for taking time to visit with me today. Please don't hesitate to contact me if I can be of assistance to you before our next scheduled telephone appointment.  Our next appointment is by telephone on 04/04/21 at 2:30.  Please call the care guide team at [phone] if you need to cancel or reschedule your appointment.   If you are experiencing a Mental Health or [place] or need someone to talk to, please call the [place] National Suicide Prevention Lifeline: 7098355354 or TTY: (641)008-9719 TTY [phone]) to talk to a trained counselor   Patient verbalizes understanding of instructions and care plan provided today and agrees to view in [place]. Active MyChart status confirmed with patient.    The patient has been provided with contact information for the care management team and has been advised to call with any health related questions or concerns.   [name], RN, BSN, CCM Care Management Coordinator [facility] Phone: [phone]: [phone]

## 2021-02-07 NOTE — Chronic Care Management (AMB) (Signed)
Care Management    RN Visit Note  02/07/2021 Name: [name] MRN: [id number] DOB: [birth year]  Subjective: [name] is a 46 y.o. year old male who is a primary care patient of [name], MD. The care management team was consulted for assistance with disease management and care coordination needs.    Engaged with patient by telephone for follow up visit in response to provider referral for case management and/or care coordination services.   Consent to Services:   [name] was given information about Care Management services today including:  Care Management services includes personalized support from designated clinical staff supervised by his physician, including individualized plan of care and coordination with other care providers 24/7 contact phone numbers for assistance for urgent and routine care needs. The patient may stop case management services at any time by phone call to the office staff.  Patient agreed to services and consent obtained.   Assessment: Review of patient past medical history, allergies, medications, health status, including review of consultants reports, laboratory and other test data, was performed as part of comprehensive evaluation and provision of chronic care management services.   SDOH (Social Determinants of Health) assessments and interventions performed:    Care Plan  Allergies  Allergen Reactions   Reglan [Metoclopramide] Other (See Comments)    Dysphoric reaction   Metformin And Related Diarrhea   Ozempic (0.25 Or 0.5 Mg-Dose) [Semaglutide(0.25 Or 0.25m-Dos)] Nausea And Vomiting    Outpatient Encounter Medications as of 02/07/2021  Medication Sig Note   ELIQUIS 5 MG TABS tablet Take 1 tablet by mouth twice daily    Accu-Chek Softclix Lancets lancets Check blood sugar up to 3 times a day (Patient taking differently: 1 each by Other route See admin instructions. Check blood sugar up to 4-5 times a day)    acidophilus (RISAQUAD)  CAPS capsule Take 1 capsule by mouth daily.    Alpha-Lipoic Acid 600 MG CAPS Take 600 mg by mouth daily.    amLODipine (NORVASC) 10 MG tablet Take 10 mg by mouth at bedtime.    Blood Glucose Monitoring Suppl (ACCU-CHEK GUIDE ME) w/Device KIT Use to check blood sugar up to 3 times a day (Patient taking differently: 1 each by Other route See admin instructions. Use to check blood sugar up to 4-5 times a day)    carvedilol (COREG) 25 MG tablet TAKE 1 TABLET BY MOUTH TWICE DAILY WITH  A  MEAL    Cyanocobalamin (B-12) 500 MCG SUBL Place 500 mcg under the tongue daily.     cyclobenzaprine (FLEXERIL) 5 MG tablet Take 1 tablet (5 mg total) by mouth at bedtime as needed for muscle spasms.    diclofenac Sodium (VOLTAREN) 1 % GEL Apply 2 g topically 4 (four) times daily.    famotidine (PEPCID) 20 MG tablet Take 1 tablet (20 mg total) by mouth 2 (two) times daily. (Patient taking differently: Take 20 mg by mouth 2 (two) times daily as needed for heartburn.) 11/28/2019: .   fluticasone (FLONASE) 50 MCG/ACT nasal spray Place 2 sprays into both nostrils daily. (Patient taking differently: Place 2 sprays into both nostrils daily as needed for allergies.)    furosemide (LASIX) 80 MG tablet Take 1 tablet by mouth twice daily (Patient taking differently: Take 80 mg by mouth 2 (two) times daily.)    gabapentin (NEURONTIN) 100 MG capsule Take 1 capsule (100 mg total) by mouth 3 (three) times daily.    glucose blood (ACCU-CHEK GUIDE) test strip Check blood  sugar up to 3 times a day (Patient taking differently: 1 each by Other route See admin instructions. Check blood sugar up to 4-5 times a day)    hydrALAZINE (APRESOLINE) 100 MG tablet Take 100 mg by mouth 3 (three) times daily.    insulin aspart (NOVOLOG) 100 UNIT/ML FlexPen Administer 5 units in the morning with breakfast, 20 units with lunch, and 25 units with dinner daily.  ICD Code:  E11.9 (Patient taking differently: Inject 15-30 Units into the skin 3 (three) times  daily with meals. Administer 15 units in the morning with breakfast, 25 units with lunch, and 30 units with dinner daily.  ICD Code:  E11.9)    insulin degludec (TRESIBA FLEXTOUCH) 200 UNIT/ML FlexTouch Pen INJECT 45 UNITS SUBCUTANEOUSLY ONCE DAILY (Patient taking differently: Inject 49 Units into the skin daily.)    Insulin Pen Needle (B-D UF III MINI PEN NEEDLES) 31G X 5 MM MISC 1 each by Other route See admin instructions. USE 4-5 TIMES DAILY    Insulin Pen Needle (PEN NEEDLES 3/16") 31G X 5 MM MISC Use five times daily    Lancet Devices (ACCU-CHEK SOFTCLIX) lancets Use to check blood sugars 5 times a day. Dx code:E11.9. Insulin dependent. (Patient taking differently: 1 each by Other route See admin instructions. Use to check blood sugars 5 times a day. Dx code:E11.9. Insulin dependent.)    lovastatin (MEVACOR) 20 MG tablet Take 1 tablet (20 mg total) by mouth daily.    oxybutynin (DITROPAN) 5 MG tablet Take 1 tablet (5 mg total) by mouth 3 (three) times daily.    polyethylene glycol (MIRALAX / GLYCOLAX) 17 g packet Take 17 g by mouth 2 (two) times daily. (Patient taking differently: Take 17 g by mouth as needed for mild constipation.)    potassium chloride SA (KLOR-CON) 20 MEQ tablet Take 20 mEq by mouth 2 (two) times daily.    senna-docusate (SENOKOT-S) 8.6-50 MG tablet Take 1 tablet by mouth daily. (Patient taking differently: Take 1 tablet by mouth as needed for mild constipation.)    sildenafil (VIAGRA) 50 MG tablet Take 1-2 tablets (50-100 mg total) by mouth as needed for erectile dysfunction. Take 1 hour before needed. Do not take more than 2 tablets at once.    tamsulosin (FLOMAX) 0.4 MG CAPS capsule Take 1 capsule (0.4 mg total) by mouth at bedtime.    VICTOZA 18 MG/3ML SOPN INJECT 1.2 MG INT THE SKIN ONCE DAILY AT THE SAME TIME.    No facility-administered encounter medications on file as of 02/07/2021.    Patient Active Problem List   Diagnosis Date Noted   Trapezius strain, left,  initial encounter 02/02/2021   Left shoulder pain 12/30/2020   Acute pulmonary edema (HCC)    Benign paroxysmal positional vertigo 09/14/2020   Anemia associated with chronic renal failure    Hemoglobinuria 12/12/2018   OSA (obstructive sleep apnea) 02/28/2018   Chronic diastolic heart failure ([place]) 02/02/2018   Preventative health care 08/15/2017   Erectile dysfunction 04/22/2017   History of pulmonary embolus (PE) 06/09/2016   Depression 05/04/2016   Hypogonadism in male 05/04/2016   Dyslipidemia 08/12/2014   Unilateral AKA, right ([place]) 12/26/2013   Hyperlipidemia 02/19/2013   S/P cardiac cath, 02/18/13, normal coronaries 02/19/2013   Chronic renal insufficiency, stage 3 (moderate) ([place]) 02/19/2013   Hypertension 07/24/2011   Long term current use of anticoagulant therapy 07/19/2011   History of DVT (deep vein thrombosis) 07/28/2009   Morbid obesity ([place]) 10/26/2005   Insulin dependent  type 2 diabetes mellitus, controlled ([place]) 01/09/2002    Conditions to be addressed/monitored: CHF, HTN, and DMII  Care Plan : Diabetes Type 2 (Adult)  Updates made by [name], RN since 02/07/2021 12:00 AM     Problem: Currently meeting A1C target but having large fluctuations in blood sugars   Priority: High  Onset Date: 12/15/2019     Goal: Glycemic Management Optimized without large fluctuations in blood sugars   Start Date: 12/15/2019  Expected End Date: 11/07/2021  Recent Progress: On track  Priority: High  Note:   Objective: Successful outreach to patient for follow up.  Patient is feeling well and has no complaints.  Patient shared that he is getting his new sleeve for his prosthetic leg tomorrow.  He also is having trouble finding a Medicaid dentist as he needs to have a crown put on a cracked tooth.  Explained to patient since he has Internet access, he can go to the [employer] site and they have a list of dental providers.  In order to assist patient, he  provided this RNCM his e-mail address of [email or web address] and I emailed him a PDF of [facility] dentists who are taking new patients and accept Medicaid.  Patient noted his shoulder is getting better, he thinks he lifted too much weight working out and he pulled something.  He is going to use lighter weights. He expressed no other concerns at this time and will call this RNCM if he needs anything. Lab Results  Component Value Date   HGBA1C 5.5 12/28/2020   HGBA1C 4.7 (L) 10/02/2020   HGBA1C 5.5 07/08/2020   Lab Results  Component Value Date   MICROALBUR 160.61 (H) 08/14/2013   LDLCALC 52 10/26/2020   CREATININE 3.31 (H) 02/01/2021    Current Barriers:  Knowledge Deficits related to basic Diabetes pathophysiology and self care/management Unable to independently meet pre and post meal CBG targets  Case Manager Clinical Goal(s):  Over the next 30-60 days, patient will demonstrate improved adherence to prescribed treatment plan for diabetes self care/management as evidenced by:  daily monitoring and recording of CBG  adherence to ADA/ carb modified diet exercise 5-7 days/week- adherence to prescribed medication regimen Interventions:  Review of patient status, including review of consultants reports, relevant laboratory and other test results, and medications completed. Assessed satisfaction with [employer] Flash Glucose monitor Assessed frequency of hypoglycemia and ability to self treat Assessed medication taking behavior Patient encouraged to continue his exercise routine with his personal trainer Discussed plans with patient for ongoing care management follow up and provided patient with direct contact information for care management team:  Reviewed scheduled/upcoming provider appointments including:  - barriers to adherence to treatment plan identified - dental care encouraged - mutual A1C goal set or reviewed - devices to improve adherence to care identified -  self-awareness of signs/symptoms of hypo or hyperglycemia encouraged Discussed plans with patient for ongoing care management follow up and provided patient with direct contact information for care management team In the next 30-60 days, patient will:  - Self administers oral medications as prescribed Self administers insulin as prescribed Self administers injectable DM medication Victoza and Tyler Aas as prescribed Attends all scheduled provider appointments Checks blood sugars as prescribed and utilize hyper and hypoglycemia protocol as needed Adheres to prescribed ADA/carb modified Follow Up Plan: The care management team will reach out to the patient again over the next 30-60 days.       Plan: The patient has been  provided with contact information for the care management team and has been advised to call with any health related questions or concerns.   [name], RN, BSN, CCM Care Management Coordinator [facility] Phone: [phone]: [phone]

## 2021-02-09 ENCOUNTER — Other Ambulatory Visit: Payer: Self-pay

## 2021-02-09 ENCOUNTER — Other Ambulatory Visit: Payer: Self-pay | Admitting: Endocrinology

## 2021-02-09 ENCOUNTER — Ambulatory Visit (INDEPENDENT_AMBULATORY_CARE_PROVIDER_SITE_OTHER): Payer: Medicare Other | Admitting: Endocrinology

## 2021-02-09 ENCOUNTER — Encounter: Payer: Self-pay | Admitting: Endocrinology

## 2021-02-09 VITALS — BP 150/78 | HR 78 | Ht 74.0 in | Wt 338.8 lb

## 2021-02-09 DIAGNOSIS — Z89611 Acquired absence of right leg above knee: Secondary | ICD-10-CM

## 2021-02-09 DIAGNOSIS — E1165 Type 2 diabetes mellitus with hyperglycemia: Secondary | ICD-10-CM | POA: Diagnosis not present

## 2021-02-09 DIAGNOSIS — Z794 Long term (current) use of insulin: Secondary | ICD-10-CM | POA: Diagnosis not present

## 2021-02-09 MED ORDER — VICTOZA 18 MG/3ML ~~LOC~~ SOPN: PEN_INJECTOR | SUBCUTANEOUS | 1 refills | Status: DC

## 2021-02-09 NOTE — Patient Instructions (Addendum)
TRESIBA 52 and keep waking up 130 or so  Take 5 Units Novolog for dessert  Have juice 4-6 oz with 2nd Breakfast

## 2021-02-09 NOTE — Progress Notes (Signed)
Patient ID: [name], male   DOB: February 25, 1975, 46 y.o.   MRN: [id number]          Reason for Appointment: Follow-up for Type 2 Diabetes    History of Present Illness:          Date of diagnosis of type 2 diabetes mellitus:  2001      His weight was about 400 pounds at the time of diagnosis He does not know what his initial blood sugars and circumstances were but he apparently was started on insulin initially He was not able to tolerate metformin because of diarrhea Subsequently has been on insulin only, level of control not available but his A1c was significantly high at 12.8 in 2015  Background history:    Recent history:   INSULIN regimen is:   49 units Tresiba once daily  NovoLog 0 units breakfast 15-20  units before lunch and 25 units at dinner   Non-insulin hypoglycemic drugs the patient is taking are:  Victoza: 1.8 mg daily   Current management, blood sugar patterns and problems identified:   His A1c is 5.5, about the same Fructosamine is previously at 326   Interpretation of his blood sugar patterns from his freestyle Elenor Legato are as follows: Blood sugars overall are showing more variability in higher readings compared to the last time  His blood sugars are mostly averaging 160-180 at any given time of the day except much lower between 8 AM-12 PM  Overnight blood sugars are running about 160-190s starting after 10 PM without much variability  Postprandial readings after breakfast which is around 4:30 AM are increasing mildly but variably  POSTPRANDIAL readings after lunch and dinner are generally leveled with the Premeal readings but some variability and at least one significant high episode in the evening and once overnight  Blood sugars may go up mildly after his bedtime snack also  He has fairly consistent decrease in his blood sugars between 6 AM-10 AM with readings this week going as low as 58 but usually hypoglycemia is a very transient No other  hypoglycemia  Management, insulin regimen and problems:  He has been occasionally running short of his Victoza prescription as the dose was not updated to the 1.8 mg Otherwise has been tolerating 1.8 mg without nausea  Even with stopping his NovoLog at breakfast in the mornings he still has a tendency to sometimes low normal or low sugars after he finishes his exercise session around 8-8:30 AM This is despite his eating a small meal on waking up and again at the gym when he finishes his exercise and this is usually having some protein and carbohydrate like English muffin or mini bagel  However now appears to have higher readings overnight and waking up with relatively high readings  Postprandial readings do not seem to go up excessively after lunch and dinner usually  Weight is about the same    Side effects from medications have been: Diarrhea with metformin, vomiting with Ozempic  Glucose monitoring:  with freestyle libre version 2 with data as follows  CGM use % of time 75  2-week average/GV 159/25  Time in range 66  % Time Above 180 33+1  % Time above 250   % Time Below 70 <1     PRE-MEAL Fasting Lunch Dinner Bedtime Overall  Glucose range:       Averages: 161 131 154 189 159   POST-MEAL PC Breakfast PC Lunch PC Dinner  Glucose range:  Averages: 182 147 177   PREVIOUS data:   CGM use % of time 62  2-week average/GV 110  Time in range 88     %  % Time Above 180 3  % Time above 250   % Time Below 70 9     PRE-MEAL Fasting Lunch Dinner Bedtime Overall  Glucose range:       Averages: 115 103 116 129    POST-MEAL PC Breakfast PC Lunch PC Dinner  Glucose range:     Averages: 86 118 137     Self-care:    Typical meal intake: Breakfast is egg whites, some oatmeal/grits and banana.  Usually small portions of meat or other protein at lunch and dinner and only a small amount of sweet potato at lunch or carbohydrate              Dietician visit, most recent:  Unknown               Weight history:  Wt Readings from Last 3 Encounters:  02/09/21 (!) 338 lb 12.8 oz (153.7 kg)  01/31/21 (!) 339 lb 14.4 oz (154.2 kg)  12/28/20 (!) 341 lb (154.7 kg)    Glycemic control:   Lab Results  Component Value Date   HGBA1C 5.5 12/28/2020   HGBA1C 4.7 (L) 10/02/2020   HGBA1C 5.5 07/08/2020   Lab Results  Component Value Date   MICROALBUR 160.61 (H) 08/14/2013   LDLCALC 52 10/26/2020   CREATININE 3.31 (H) 02/01/2021   Lab Results  Component Value Date   MICRALBCREAT 1,528.4 (H) 02/15/2017    Lab Results  Component Value Date   FRUCTOSAMINE 326 (H) 10/26/2020   FRUCTOSAMINE 278 07/15/2020   FRUCTOSAMINE 315 (H) 04/15/2020   Lab Results  Component Value Date   HGB 10.6 (L) 02/01/2021      Allergies as of 02/09/2021       Reactions   Reglan [metoclopramide] Other (See Comments)   Dysphoric reaction   Metformin And Related Diarrhea   Ozempic (0.25 Or 0.5 Mg-dose) [semaglutide(0.25 Or 0.24m-dos)] Nausea And Vomiting        Medication List        Accurate as of February 09, 2021 11:15 AM. If you have any questions, ask your nurse or doctor.          Accu-Chek Guide Me w/Device Kit Use to check blood sugar up to 3 times a day   Accu-Chek Guide test strip Generic drug: glucose blood Check blood sugar up to 3 times a day   accu-chek softclix lancets Use to check blood sugars 5 times a day. Dx code:E11.9. Insulin dependent. What changed:  how much to take how to take this when to take this   Accu-Chek Softclix Lancets lancets Check blood sugar up to 3 times a day   acidophilus Caps capsule Take 1 capsule by mouth daily.   Alpha-Lipoic Acid 600 MG Caps Take 600 mg by mouth daily.   amLODipine 10 MG tablet Commonly known as: NORVASC Take 10 mg by mouth at bedtime.   B-12 500 MCG Subl Place 500 mcg under the tongue daily.   B-D UF III MINI PEN NEEDLES 31G X 5 MM Misc Generic drug: Insulin Pen Needle 1 each by  Other route See admin instructions. USE 4-5 TIMES DAILY   Pen Needles 3/16" 31G X 5 MM Misc Use five times daily   carvedilol 25 MG tablet Commonly known as: COREG TAKE 1 TABLET BY MOUTH TWICE DAILY  WITH  A  MEAL   cyclobenzaprine 5 MG tablet Commonly known as: FLEXERIL Take 1 tablet (5 mg total) by mouth at bedtime as needed for muscle spasms.   diclofenac Sodium 1 % Gel Commonly known as: Voltaren Apply 2 g topically 4 (four) times daily.   Eliquis 5 MG Tabs tablet Generic drug: apixaban Take 1 tablet by mouth twice daily   famotidine 20 MG tablet Commonly known as: PEPCID Take 1 tablet (20 mg total) by mouth 2 (two) times daily. What changed:  when to take this reasons to take this   fluticasone 50 MCG/ACT nasal spray Commonly known as: Flonase Place 2 sprays into both nostrils daily. What changed:  when to take this reasons to take this   furosemide 80 MG tablet Commonly known as: LASIX Take 1 tablet by mouth twice daily What changed: when to take this   gabapentin 100 MG capsule Commonly known as: NEURONTIN Take 1 capsule (100 mg total) by mouth 3 (three) times daily.   hydrALAZINE 100 MG tablet Commonly known as: APRESOLINE Take 100 mg by mouth 3 (three) times daily.   insulin aspart 100 UNIT/ML FlexPen Commonly known as: NOVOLOG Administer 5 units in the morning with breakfast, 20 units with lunch, and 25 units with dinner daily.  ICD Code:  E11.9 What changed:  how much to take how to take this when to take this additional instructions   lovastatin 20 MG tablet Commonly known as: MEVACOR Take 1 tablet (20 mg total) by mouth daily.   oxybutynin 5 MG tablet Commonly known as: DITROPAN Take 1 tablet (5 mg total) by mouth 3 (three) times daily.   polyethylene glycol 17 g packet Commonly known as: MIRALAX / GLYCOLAX Take 17 g by mouth 2 (two) times daily. What changed:  when to take this reasons to take this   potassium chloride SA 20 MEQ  tablet Commonly known as: KLOR-CON M Take 20 mEq by mouth 2 (two) times daily.   senna-docusate 8.6-50 MG tablet Commonly known as: Senokot-S Take 1 tablet by mouth daily. What changed:  when to take this reasons to take this   sildenafil 50 MG tablet Commonly known as: VIAGRA Take 1-2 tablets (50-100 mg total) by mouth as needed for erectile dysfunction. Take 1 hour before needed. Do not take more than 2 tablets at once.   tamsulosin 0.4 MG Caps capsule Commonly known as: FLOMAX Take 1 capsule (0.4 mg total) by mouth at bedtime.   Tyler Aas FlexTouch 200 UNIT/ML FlexTouch Pen Generic drug: insulin degludec INJECT 45 UNITS SUBCUTANEOUSLY ONCE DAILY What changed:  how much to take how to take this when to take this additional instructions   Victoza 18 MG/3ML Sopn Generic drug: liraglutide INJECT 1.2 MG INT THE SKIN ONCE DAILY AT THE SAME TIME.        Allergies:  Allergies  Allergen Reactions   Reglan [Metoclopramide] Other (See Comments)    Dysphoric reaction   Metformin And Related Diarrhea   Ozempic (0.25 Or 0.5 Mg-Dose) [Semaglutide(0.25 Or 0.100m-Dos)] Nausea And Vomiting    Past Medical History:  Diagnosis Date   Acute venous embolism and thrombosis of deep vessels of proximal lower extremity (HCC) 07/19/2011   Anemia    Anginal pain (HCC)    pt denies   Anxiety    Chest pain, neg MI, normal coronaries by cath 02/18/2013   pt denies   [place] ([place]estive heart failure) [place]    Chronic renal disease, stage IV ([facility] 02/19/2013  Colon polyps    Depression    Diabetic ulcer of right foot ([place])    DVT (deep venous thrombosis) ([place]) 09/2002   patient reports additional DVTs in '06 & '11 (unconfirmed)   ED (erectile dysfunction)    GERD (gastroesophageal reflux disease)    History of blood transfusion    "related to OR" (10/31/2016)   Hyperlipidemia 02/19/2013   Hypertension    Nausea & vomiting    "constant for the last couple weeks" (10/31/2016)    Nephrotic syndrome    Obesity    BMI 44, weight 346 pounds 01/30/14   OSA (obstructive sleep apnea) 02/28/2018   Mild obstructive sleep apnea with an AHI of 9.8/h but severe during REM sleep with an AHI of 33.8/h.  Oxygen saturations dropped to 86% and there was moderate snoring   Peripheral edema    Pneumonia    Prosthesis adjustment 08/17/2016   Pulmonary embolism ([place]) 09/2002   treated with 6 months of warfarin   Pyelonephritis 02/02/2018   Type I diabetes mellitus ([place]) dx'd 2001   Urinary retention     Past Surgical History:  Procedure Laterality Date   AMPUTATION Right 12/22/2013   Procedure: AMPUTATION BELOW KNEE;  Surgeon: [name], MD;  Location: [facility];  Service: Orthopedics;  Laterality: Right;   AMPUTATION Right 02/19/2014   Procedure: RIGHT ABOVE KNEE AMPUTATION ;  Surgeon: [name], MD;  Location: [facility];  Service: Orthopedics;  Laterality: Right;   blood clot removal  2016   CARDIAC CATHETERIZATION  02/18/2013   normal coronaries   ESOPHAGOGASTRODUODENOSCOPY N/A 11/02/2016   Procedure: ESOPHAGOGASTRODUODENOSCOPY (EGD);  Surgeon: [name], MD;  Location[facility] ENDOSCOPY;  Service: Endoscopy;  Laterality: N/A;   I & D EXTREMITY Right 12/14/2013   Procedure: IRRIGATION AND DEBRIDEMENT RIGHT FOOT;  Surgeon: [name], MD;  Location: [facility];  Service: Orthopedics;  Laterality: Right;   INCISION AND DRAINAGE ABSCESS  2007; 2015   "back"   INCISION AND DRAINAGE OF WOUND Right 12/22/2013   Procedure: I&D RIGHT BUTTOCK;  Surgeon: [name], MD;  Location: [facility];  Service: Orthopedics;  Laterality: Right;   LEFT HEART CATHETERIZATION WITH CORONARY ANGIOGRAM N/A 02/18/2013   Procedure: LEFT HEART CATHETERIZATION WITH CORONARY ANGIOGRAM;  Surgeon: [name], MD;  Location[facility] CATH LAB;  Service: Cardiovascular;  Laterality: N/A;    Family History  Problem Relation Age of Onset   Heart disease Mother    Diabetes Mother    Diabetes Maternal Uncle    Diabetes  Cousin     Social History:  reports that he has never smoked. He has never used smokeless tobacco. He reports current alcohol use. He reports that he does not use drugs.   Review of Systems    Lipid history: Has been on lovastatin 20 mg with labs as follows: Prescribed by PCP   Lab Results  Component Value Date   CHOL 105 10/26/2020   HDL 37.40 (L) 10/26/2020   LDLCALC 52 10/26/2020   TRIG 81.0 10/26/2020   CHOLHDL 3 10/26/2020           Hypertension: He is taking clonidine, Norvasc, hydralazine and Coreg followed by nephrologist  BP Readings from Last 3 Encounters:  02/09/21 (!) 150/78  02/01/21 (!) 154/80  01/31/21 135/72     Most recent eye exam was in 3/82   Complications of diabetes: Nephropathy with renal insufficiency and proteinuria, unknown status of retinopathy, erectile dysfunction  Followed by nephrologist for CKD  He is on ESA as well as iron infusions for anemia  Lab Results  Component Value Date   CREATININE 3.31 (H) 02/01/2021   CREATININE 4.44 (H) 01/11/2021   CREATININE 3.38 (H) 12/21/2020    Lab Results  Component Value Date   HGB 10.6 (L) 02/01/2021      LABS:  No visits with results within 1 Week(s) from this visit.  Latest known visit with results is:  Hospital Outpatient Visit on 02/01/2021  Component Date Value Ref Range Status   Ferritin 02/01/2021 318  24 - 336 ng/mL Final   Performed at [facility], [place] [address], [place], [place] [address]   Iron 02/01/2021 67  45 - 182 ug/dL Final   TIBC 02/01/2021 307  250 - 450 ug/dL Final   Saturation Ratios 02/01/2021 22  17.9 - 39.5 % Final   UIBC 02/01/2021 240  ug/dL Final   Performed at [facility], [place] [address], [place], [place] [address]   Sodium 02/01/2021 135  135 - 145 mmol/L Final   Potassium 02/01/2021 4.2  3.5 - 5.1 mmol/L Final   Chloride 02/01/2021 98  98 - 111 mmol/L Final   CO2 02/01/2021 24  22 - 32 mmol/L Final   Glucose, Bld 02/01/2021 46 (L)  70 -  99 mg/dL Final   Glucose reference range applies only to samples taken after fasting for at least 8 hours.   BUN 02/01/2021 67 (H)  6 - 20 mg/dL Final   Creatinine, Ser 02/01/2021 3.31 (H)  0.61 - 1.24 mg/dL Final   Calcium 02/01/2021 9.2  8.9 - 10.3 mg/dL Final   Phosphorus 02/01/2021 4.0  2.5 - 4.6 mg/dL Final   Albumin 02/01/2021 3.6  3.5 - 5.0 g/dL Final   GFR, Estimated 02/01/2021 23 (L)  >60 mL/min Final   Comment: (NOTE) Calculated using the CKD-EPI Creatinine Equation (2021)    Anion gap 02/01/2021 13  5 - 15 Final   Performed at [facility], [place] [address], [place], [place] [address]   Hemoglobin 02/01/2021 10.6 (L)  13.0 - 17.0 g/dL Final    Physical Examination:  BP (!) 150/78    Pulse 78    Ht 6' 2"  (1.88 m)    Wt (!) 338 lb 12.8 oz (153.7 kg)    SpO2 94%    BMI 43.50 kg/m       ASSESSMENT:  Diabetes type 2, with morbid obesity  See history of present illness for detailed discussion of current diabetes management, blood sugar patterns and problems identified  His last A1c is falsely low at 5.5 last month  Fructosamine is not recently available  Is on basal bolus insulin and Victoza 1.8 mg daily  His daily management and blood sugar patterns were reviewed in detail His freestyle libre sensor appears to be relatively accurate when compared at home and also previously on the lab  His blood sugars are appearing to be higher in the last 2 weeks at least compared to his previous visit  Not clear why blood sugars are relatively higher as he has not had any significant change in diet However with stopping NovoLog at breakfast time on his exercise days blood sugars may be not as low  Overnight blood sugars appear to be mostly higher now but subsequently blood sugars decrease because of exercise to low normal readings by about 8-9 AM  Recently has not been getting 3 pens of his Victoza per month and the  dose needs to be updated Previously intolerant to  Ozempic  Hypertension: Blood pressure to be followed by nephrologist   PLAN:    He will go up to 52 at least on the Tresiba and go up further if his morning sugars are staying over 130 Since he had a significant decrease in his blood sugar with exercise he will drink 4 to 6 ounces of juice or other sweet drink rather than Gatorade at around 8 AM after he finishes his exercise regimen Adjust the dose of NovoLog 5 units up or down based on his meal size at lunch and dinner Add 5 units of NovoLog coverage for his bedtime snack which is usually an [place] ice  New prescription for Victoza sent Currently Darcel Bayley is not covered and he will continue Victoza regularly  Follow-up in 12 weeks   Patient Instructions  TRESIBA 52 and keep waking up 130 or so  Take 5 Units Novolog for dessert  Have juice 4-6 oz with 2nd Breakfast     Total visit time including counseling = 30 minutes    [name] 02/09/2021, 11:15 AM   Note: This office note was prepared with Dragon voice recognition system technology. Any transcriptional errors that result from this process are unintentional.

## 2021-02-10 NOTE — Progress Notes (Signed)
Internal Medicine Clinic Attending ? ?Case discussed with Dr. Winters  At the time of the visit.  We reviewed the resident?s history and exam and pertinent patient test results.  I agree with the assessment, diagnosis, and plan of care documented in the resident?s note.  ?

## 2021-02-11 ENCOUNTER — Ambulatory Visit: Payer: Medicare Other | Admitting: Endocrinology

## 2021-02-11 ENCOUNTER — Encounter: Payer: Self-pay | Admitting: Endocrinology

## 2021-02-11 DIAGNOSIS — E876 Hypokalemia: Secondary | ICD-10-CM | POA: Diagnosis not present

## 2021-02-11 DIAGNOSIS — E669 Obesity, unspecified: Secondary | ICD-10-CM | POA: Diagnosis not present

## 2021-02-11 DIAGNOSIS — N184 Chronic kidney disease, stage 4 (severe): Secondary | ICD-10-CM | POA: Diagnosis not present

## 2021-02-11 DIAGNOSIS — N2581 Secondary hyperparathyroidism of renal origin: Secondary | ICD-10-CM | POA: Diagnosis not present

## 2021-02-11 DIAGNOSIS — I129 Hypertensive chronic kidney disease with stage 1 through stage 4 chronic kidney disease, or unspecified chronic kidney disease: Secondary | ICD-10-CM | POA: Diagnosis not present

## 2021-02-22 ENCOUNTER — Other Ambulatory Visit: Payer: Self-pay

## 2021-02-22 ENCOUNTER — Ambulatory Visit (HOSPITAL_COMMUNITY)
Admission: RE | Admit: 2021-02-22 | Discharge: 2021-02-22 | Disposition: A | Discharge status: Home / Self Care | Payer: Medicare Other | Source: Ambulatory Visit | Attending: Nephrology | Admitting: Nephrology

## 2021-02-22 VITALS — BP 159/76 | HR 87

## 2021-02-22 DIAGNOSIS — D631 Anemia in chronic kidney disease: Secondary | ICD-10-CM | POA: Insufficient documentation

## 2021-02-22 DIAGNOSIS — N183 Chronic kidney disease, stage 3 unspecified: Secondary | ICD-10-CM | POA: Diagnosis not present

## 2021-02-22 DIAGNOSIS — N2889 Other specified disorders of kidney and ureter: Secondary | ICD-10-CM

## 2021-02-22 LAB — IRON AND TIBC
Iron: 71 ug/dL (ref 45–182)
Saturation Ratios: 22 % (ref 17.9–39.5)
TIBC: 329 ug/dL (ref 250–450)
UIBC: 258 ug/dL

## 2021-02-22 LAB — RENAL FUNCTION PANEL
Albumin: 3.6 g/dL (ref 3.5–5.0)
Anion gap: 10 (ref 5–15)
BUN: 79 mg/dL — ABNORMAL HIGH (ref 6–20)
CO2: 24 mmol/L (ref 22–32)
Calcium: 9.3 mg/dL (ref 8.9–10.3)
Chloride: 103 mmol/L (ref 98–111)
Creatinine, Ser: 3.82 mg/dL — ABNORMAL HIGH (ref 0.61–1.24)
GFR, Estimated: 19 mL/min — ABNORMAL LOW (ref >60)
Glucose, Bld: 59 mg/dL — ABNORMAL LOW (ref 70–99)
Phosphorus: 5.2 mg/dL — ABNORMAL HIGH (ref 2.5–4.6)
Potassium: 4.6 mmol/L (ref 3.5–5.1)
Sodium: 137 mmol/L (ref 135–145)

## 2021-02-22 LAB — POCT HEMOGLOBIN-HEMACUE: Hemoglobin: 11.9 g/dL — ABNORMAL LOW (ref 13.0–17.0)

## 2021-02-22 LAB — FERRITIN: Ferritin: 291 ng/mL (ref 24–336)

## 2021-02-22 MED ORDER — DARBEPOETIN ALFA 200 MCG/0.4ML IJ SOSY
PREFILLED_SYRINGE | INTRAMUSCULAR | Status: AC
  Administered 2021-02-22: 200 ug via SUBCUTANEOUS
  Filled 2021-02-22: qty 0.4

## 2021-02-22 MED ORDER — DARBEPOETIN ALFA 200 MCG/0.4ML IJ SOSY: 200.0000 ug | PREFILLED_SYRINGE | INTRAMUSCULAR | Status: DC

## 2021-02-23 LAB — PTH, INTACT AND CALCIUM
Calcium, Total (PTH): 9.2 mg/dL (ref 8.7–10.2)
PTH: 104 pg/mL — ABNORMAL HIGH (ref 15–65)

## 2021-03-08 ENCOUNTER — Other Ambulatory Visit: Payer: Self-pay | Admitting: Internal Medicine

## 2021-03-09 NOTE — Progress Notes (Signed)
Opened in error

## 2021-03-15 ENCOUNTER — Other Ambulatory Visit: Payer: Self-pay

## 2021-03-15 ENCOUNTER — Encounter (HOSPITAL_COMMUNITY)
Admission: RE | Admit: 2021-03-15 | Discharge: 2021-03-15 | Disposition: A | Discharge status: Home / Self Care | Payer: Medicare Other | Source: Ambulatory Visit | Attending: Nephrology | Admitting: Nephrology

## 2021-03-15 VITALS — BP 112/78 | HR 85 | Temp 97.8°F | Resp 20

## 2021-03-15 DIAGNOSIS — E669 Obesity, unspecified: Secondary | ICD-10-CM | POA: Diagnosis not present

## 2021-03-15 DIAGNOSIS — N2889 Other specified disorders of kidney and ureter: Secondary | ICD-10-CM

## 2021-03-15 DIAGNOSIS — N183 Chronic kidney disease, stage 3 unspecified: Secondary | ICD-10-CM | POA: Diagnosis not present

## 2021-03-15 DIAGNOSIS — Z86718 Personal history of other venous thrombosis and embolism: Secondary | ICD-10-CM | POA: Diagnosis not present

## 2021-03-15 DIAGNOSIS — N2581 Secondary hyperparathyroidism of renal origin: Secondary | ICD-10-CM | POA: Diagnosis not present

## 2021-03-15 DIAGNOSIS — D631 Anemia in chronic kidney disease: Secondary | ICD-10-CM | POA: Diagnosis not present

## 2021-03-15 DIAGNOSIS — N184 Chronic kidney disease, stage 4 (severe): Secondary | ICD-10-CM | POA: Diagnosis not present

## 2021-03-15 DIAGNOSIS — Z794 Long term (current) use of insulin: Secondary | ICD-10-CM | POA: Diagnosis not present

## 2021-03-15 DIAGNOSIS — E1122 Type 2 diabetes mellitus with diabetic chronic kidney disease: Secondary | ICD-10-CM | POA: Diagnosis not present

## 2021-03-15 DIAGNOSIS — E876 Hypokalemia: Secondary | ICD-10-CM | POA: Diagnosis not present

## 2021-03-15 DIAGNOSIS — I129 Hypertensive chronic kidney disease with stage 1 through stage 4 chronic kidney disease, or unspecified chronic kidney disease: Secondary | ICD-10-CM | POA: Diagnosis not present

## 2021-03-15 LAB — IRON AND TIBC
Iron: 76 ug/dL (ref 45–182)
Saturation Ratios: 25 % (ref 17.9–39.5)
TIBC: 308 ug/dL (ref 250–450)
UIBC: 232 ug/dL

## 2021-03-15 LAB — RENAL FUNCTION PANEL
Albumin: 3.5 g/dL (ref 3.5–5.0)
Anion gap: 8 (ref 5–15)
BUN: 74 mg/dL — ABNORMAL HIGH (ref 6–20)
CO2: 27 mmol/L (ref 22–32)
Calcium: 9 mg/dL (ref 8.9–10.3)
Chloride: 104 mmol/L (ref 98–111)
Creatinine, Ser: 3.62 mg/dL — ABNORMAL HIGH (ref 0.61–1.24)
GFR, Estimated: 20 mL/min — ABNORMAL LOW (ref >60)
Glucose, Bld: 89 mg/dL (ref 70–99)
Phosphorus: 4.1 mg/dL (ref 2.5–4.6)
Potassium: 4.2 mmol/L (ref 3.5–5.1)
Sodium: 139 mmol/L (ref 135–145)

## 2021-03-15 LAB — POCT HEMOGLOBIN-HEMACUE: Hemoglobin: 11.2 g/dL — ABNORMAL LOW (ref 13.0–17.0)

## 2021-03-15 LAB — FERRITIN: Ferritin: 281 ng/mL (ref 24–336)

## 2021-03-15 MED ORDER — DARBEPOETIN ALFA 200 MCG/0.4ML IJ SOSY
PREFILLED_SYRINGE | INTRAMUSCULAR | Status: AC
  Filled 2021-03-15: qty 0.4

## 2021-03-15 MED ORDER — DARBEPOETIN ALFA 200 MCG/0.4ML IJ SOSY
200.0000 ug | PREFILLED_SYRINGE | INTRAMUSCULAR | Status: DC
  Administered 2021-03-15: 200 ug via SUBCUTANEOUS

## 2021-03-16 DIAGNOSIS — E113312 Type 2 diabetes mellitus with moderate nonproliferative diabetic retinopathy with macular edema, left eye: Secondary | ICD-10-CM | POA: Diagnosis not present

## 2021-03-28 ENCOUNTER — Other Ambulatory Visit: Payer: Self-pay | Admitting: Student

## 2021-03-28 ENCOUNTER — Other Ambulatory Visit: Payer: Self-pay | Admitting: Internal Medicine

## 2021-03-28 DIAGNOSIS — I5032 Chronic diastolic (congestive) heart failure: Secondary | ICD-10-CM

## 2021-04-04 ENCOUNTER — Ambulatory Visit: Payer: Medicare Other

## 2021-04-04 NOTE — Chronic Care Management (AMB) (Signed)
Care Management    RN Visit Note  04/04/2021 Name: [name] MRN: [id number] DOB: 1975-01-25  Subjective: [name] is a 46 y.o. year old male who is a primary care patient of Victor Amen, MD. The care management team was consulted for assistance with disease management and care coordination needs.    Engaged with patient by telephone for follow up visit in response to provider referral for case management and/or care coordination services.   Consent to Services:   [name] was given information about Care Management services today including:  Care Management services includes personalized support from designated clinical staff supervised by his physician, including individualized plan of care and coordination with other care providers 24/7 contact phone numbers for assistance for urgent and routine care needs. The patient may stop case management services at any time by phone call to the office staff.  Patient agreed to services and consent obtained.   Assessment: Review of patient past medical history, allergies, medications, health status, including review of consultants reports, laboratory and other test data, was performed as part of comprehensive evaluation and provision of chronic care management services.   SDOH (Social Determinants of Health) assessments and interventions performed:    Care Plan  Allergies  Allergen Reactions   Reglan [Metoclopramide] Other (See Comments)    Dysphoric reaction   Metformin And Related Diarrhea   Ozempic (0.25 Or 0.5 Mg-Dose) [Semaglutide(0.25 Or 0.5mg -Dos)] Nausea And Vomiting    Outpatient Encounter Medications as of 04/04/2021  Medication Sig Note   carvedilol (COREG) 25 MG tablet TAKE 1 TABLET BY MOUTH TWICE DAILY WITH A MEAL    Accu-Chek Softclix Lancets lancets Check blood sugar up to 3 times a day (Patient not taking: Reported on 02/09/2021)    acidophilus (RISAQUAD) CAPS capsule Take 1 capsule by mouth daily.     Alpha-Lipoic Acid 600 MG CAPS Take 600 mg by mouth daily.    amLODipine (NORVASC) 10 MG tablet Take 10 mg by mouth at bedtime.    Blood Glucose Monitoring Suppl (ACCU-CHEK GUIDE ME) w/Device KIT Use to check blood sugar up to 3 times a day (Patient not taking: Reported on 02/09/2021)    Cyanocobalamin (B-12) 500 MCG SUBL Place 500 mcg under the tongue daily.     cyclobenzaprine (FLEXERIL) 5 MG tablet Take 1 tablet (5 mg total) by mouth at bedtime as needed for muscle spasms.    diclofenac Sodium (VOLTAREN) 1 % GEL Apply 2 g topically 4 (four) times daily.    ELIQUIS 5 MG TABS tablet Take 1 tablet by mouth twice daily    famotidine (PEPCID) 20 MG tablet Take 1 tablet (20 mg total) by mouth 2 (two) times daily. (Patient taking differently: Take 20 mg by mouth 2 (two) times daily as needed for heartburn.) 11/28/2019: .   fluticasone (FLONASE) 50 MCG/ACT nasal spray Place 2 sprays into both nostrils daily. (Patient taking differently: Place 2 sprays into both nostrils daily as needed for allergies.)    furosemide (LASIX) 80 MG tablet Take 1 tablet by mouth twice daily (Patient taking differently: Take 80 mg by mouth 2 (two) times daily.)    gabapentin (NEURONTIN) 100 MG capsule Take 1 capsule (100 mg total) by mouth 3 (three) times daily.    glucose blood (ACCU-CHEK GUIDE) test strip Check blood sugar up to 3 times a day (Patient not taking: Reported on 02/09/2021)    hydrALAZINE (APRESOLINE) 100 MG tablet Take 100 mg by mouth 3 (three) times daily.  insulin aspart (NOVOLOG) 100 UNIT/ML FlexPen Administer 5 units in the morning with breakfast, 20 units with lunch, and 25 units with dinner daily.  ICD Code:  E11.9 (Patient taking differently: Inject 15-30 Units into the skin 3 (three) times daily with meals. Administer 15 units in the morning with breakfast, 25 units with lunch, and 30 units with dinner daily.  ICD Code:  E11.9)    insulin degludec (TRESIBA FLEXTOUCH) 200 UNIT/ML FlexTouch Pen INJECT 96 UNITS  SUBCUTANEOUSLY ONCE DAILY    Insulin Pen Needle (B-D UF III MINI PEN NEEDLES) 31G X 5 MM MISC 1 each by Other route See admin instructions. USE 4-5 TIMES DAILY    Insulin Pen Needle (PEN NEEDLES 3/16") 31G X 5 MM MISC Use five times daily    Lancet Devices (ACCU-CHEK SOFTCLIX) lancets Use to check blood sugars 5 times a day. Dx code:E11.9. Insulin dependent. (Patient taking differently: 1 each by Other route See admin instructions. Use to check blood sugars 5 times a day. Dx code:E11.9. Insulin dependent.)    lovastatin (MEVACOR) 20 MG tablet Take 1 tablet (20 mg total) by mouth daily.    oxybutynin (DITROPAN) 5 MG tablet TAKE 1 TABLET BY MOUTH THREE TIMES DAILY    polyethylene glycol (MIRALAX / GLYCOLAX) 17 g packet Take 17 g by mouth 2 (two) times daily. (Patient taking differently: Take 17 g by mouth as needed for mild constipation.)    potassium chloride SA (KLOR-CON) 20 MEQ tablet Take 20 mEq by mouth 2 (two) times daily.    senna-docusate (SENOKOT-S) 8.6-50 MG tablet Take 1 tablet by mouth daily. (Patient taking differently: Take 1 tablet by mouth as needed for mild constipation.)    sildenafil (VIAGRA) 50 MG tablet Take 1-2 tablets (50-100 mg total) by mouth as needed for erectile dysfunction. Take 1 hour before needed. Do not take more than 2 tablets at once.    tamsulosin (FLOMAX) 0.4 MG CAPS capsule Take 1 capsule (0.4 mg total) by mouth at bedtime.    VICTOZA 18 MG/3ML SOPN INJECT 1.8 MG IN THE SKIN ONCE DAILY AT THE SAME TIME.    No facility-administered encounter medications on file as of 04/04/2021.    Patient Active Problem List   Diagnosis Date Noted   Trapezius strain, left, initial encounter 02/02/2021   Left shoulder pain 12/30/2020   Acute pulmonary edema (HCC)    Benign paroxysmal positional vertigo 09/14/2020   Anemia associated with chronic renal failure    Hemoglobinuria 12/12/2018   OSA (obstructive sleep apnea) 02/28/2018   Chronic diastolic heart failure (HCC)  98/11/9145   Preventative health care 08/15/2017   Erectile dysfunction 04/22/2017   History of pulmonary embolus (PE) 06/09/2016   Depression 05/04/2016   Hypogonadism in male 05/04/2016   Dyslipidemia 08/12/2014   Unilateral AKA, right (HCC) 12/26/2013   Hyperlipidemia 02/19/2013   S/P cardiac cath, 02/18/13, normal coronaries 02/19/2013   Chronic renal insufficiency, stage 3 (moderate) (HCC) 02/19/2013   Hypertension 07/24/2011   Long term current use of anticoagulant therapy 07/19/2011   History of DVT (deep vein thrombosis) 07/28/2009   Morbid obesity (HCC) 10/26/2005   Insulin dependent type 2 diabetes mellitus, controlled (HCC) 01/09/2002    Conditions to be addressed/monitored: HTN, DMII, and CKD Stage 3  Care Plan : Diabetes Type 2 (Adult)  Updates made by Jodelle Gross, RN since 04/04/2021 12:00 AM     Problem: Currently meeting A1C target but having large fluctuations in blood sugars   Priority: High  Onset  Date: 12/15/2019     Goal: Glycemic Management Optimized without large fluctuations in blood sugars   Start Date: 12/15/2019  Recent Progress: On track  Priority: High  Note:   Objective: Successful outreach to patient for follow up.  Patient notes he is doing well and would like to reconsider having weight loss surgery. He was suppose to have weight loss surgery a  few years ago and it was canceled because of his kidney function.  Patient would like to have a referral to [facility]- Dr. Buzzy Han.  Patient says his kidney function has improved and he has been working very hard on exercising and keeping his A1C down.  Message sent to Dr. Sande Brothers with the patient request for referral. Lab Results  Component Value Date   HGBA1C 5.5 12/28/2020   HGBA1C 4.7 (L) 10/02/2020   HGBA1C 5.5 07/08/2020   Lab Results  Component Value Date   MICROALBUR 160.61 (H) 08/14/2013   LDLCALC 52 10/26/2020   CREATININE 3.62 (H) 03/15/2021    Current  Barriers:  Knowledge Deficits related to basic Diabetes pathophysiology and self care/management Unable to independently meet pre and post meal CBG targets  Case Manager Clinical Goal(s):  Over the next 30-60 days, patient will demonstrate improved adherence to prescribed treatment plan for diabetes self care/management as evidenced by:  daily monitoring and recording of CBG  adherence to ADA/ carb modified diet exercise 5-7 days/week- adherence to prescribed medication regimen Interventions:  Review of patient status, including review of consultants reports, relevant laboratory and other test results, and medications completed. Assessed satisfaction with Freestyle Libre Flash Glucose monitor Assessed frequency of hypoglycemia and ability to self treat Assessed medication taking behavior Patient encouraged to continue his exercise routine with his personal trainer Discussed plans with patient for ongoing care management follow up and provided patient with direct contact information for care management team:  Reviewed scheduled/upcoming provider appointments including:  - barriers to adherence to treatment plan identified - dental care encouraged - mutual A1C goal set or reviewed - devices to improve adherence to care identified - self-awareness of signs/symptoms of hypo or hyperglycemia encouraged Discussed plans with patient for ongoing care management follow up and provided patient with direct contact information for care management team In the next 30-60 days, patient will:  - Self administers oral medications as prescribed Self administers insulin as prescribed Self administers injectable DM medication Victoza and Evaristo Bury as prescribed Attends all scheduled provider appointments Checks blood sugars as prescribed and utilize hyper and hypoglycemia protocol as needed Adheres to prescribed ADA/carb modified Follow Up Plan: The care management team will reach out to the patient again  over the next 30-60 days.       Plan: The patient has been provided with contact information for the care management team and has been advised to call with any health related questions or concerns.   Jodelle Gross, RN, BSN, CCM Care Management Coordinator [facility] Phone: [phone]/Fax: [phone]

## 2021-04-04 NOTE — Patient Instructions (Signed)
Visit Information ? ?Thank you for taking time to visit with me today. Please don't hesitate to contact me if I can be of assistance to you before our next scheduled telephone appointment. ? ?Our next appointment is by telephone on 05/16/21 at 1:45.  RNCM to call patient after receipt of answer from PCP about referral. ? ?Please call the care guide team at [phone] if you need to cancel or reschedule your appointment.  ? ?If you are experiencing a Mental Health or [place] or need someone to talk to, please call the [place] National Suicide Prevention Lifeline: (209) 249-5634 or TTY: (718)580-2451 TTY 226-629-3056) to talk to a trained counselor  ? ?The patient verbalized understanding of instructions, educational materials, and care plan provided today and declined offer to receive copy of patient instructions, educational materials, and care plan.  ? ?The patient has been provided with contact information for the care management team and has been advised to call with any health related questions or concerns.  ? ?[name], RN, BSN, CCM ?Care Management Coordinator ?[facility] ?Phone: [phone]/HOZ: [phone]  ?

## 2021-04-05 ENCOUNTER — Encounter (HOSPITAL_COMMUNITY)
Admission: RE | Admit: 2021-04-05 | Discharge: 2021-04-05 | Disposition: A | Discharge status: Home / Self Care | Payer: Medicare Other | Source: Ambulatory Visit | Attending: Nephrology | Admitting: Nephrology

## 2021-04-05 VITALS — BP 132/74 | HR 82 | Temp 98.0°F | Resp 20

## 2021-04-05 DIAGNOSIS — N183 Chronic kidney disease, stage 3 unspecified: Secondary | ICD-10-CM | POA: Diagnosis not present

## 2021-04-05 DIAGNOSIS — D631 Anemia in chronic kidney disease: Secondary | ICD-10-CM | POA: Diagnosis not present

## 2021-04-05 LAB — RENAL FUNCTION PANEL
Albumin: 3.4 g/dL — ABNORMAL LOW (ref 3.5–5.0)
Anion gap: 10 (ref 5–15)
BUN: 76 mg/dL — ABNORMAL HIGH (ref 6–20)
CO2: 25 mmol/L (ref 22–32)
Calcium: 8.9 mg/dL (ref 8.9–10.3)
Chloride: 102 mmol/L (ref 98–111)
Creatinine, Ser: 3.85 mg/dL — ABNORMAL HIGH (ref 0.61–1.24)
GFR, Estimated: 19 mL/min — ABNORMAL LOW (ref >60)
Glucose, Bld: 143 mg/dL — ABNORMAL HIGH (ref 70–99)
Phosphorus: 5.3 mg/dL — ABNORMAL HIGH (ref 2.5–4.6)
Potassium: 3.9 mmol/L (ref 3.5–5.1)
Sodium: 137 mmol/L (ref 135–145)

## 2021-04-05 LAB — IRON AND TIBC
Iron: 62 ug/dL (ref 45–182)
Saturation Ratios: 20 % (ref 17.9–39.5)
TIBC: 308 ug/dL (ref 250–450)
UIBC: 246 ug/dL

## 2021-04-05 LAB — POCT HEMOGLOBIN-HEMACUE: Hemoglobin: 11.4 g/dL — ABNORMAL LOW (ref 13.0–17.0)

## 2021-04-05 LAB — FERRITIN: Ferritin: 231 ng/mL (ref 24–336)

## 2021-04-05 MED ORDER — DARBEPOETIN ALFA 200 MCG/0.4ML IJ SOSY
PREFILLED_SYRINGE | INTRAMUSCULAR | Status: AC
  Filled 2021-04-05: qty 0.4

## 2021-04-05 MED ORDER — DARBEPOETIN ALFA 200 MCG/0.4ML IJ SOSY
200.0000 ug | PREFILLED_SYRINGE | INTRAMUSCULAR | Status: DC
  Administered 2021-04-05: 200 ug via SUBCUTANEOUS

## 2021-04-07 ENCOUNTER — Other Ambulatory Visit: Payer: Self-pay | Admitting: Internal Medicine

## 2021-04-07 ENCOUNTER — Telehealth: Payer: Self-pay

## 2021-04-07 NOTE — Progress Notes (Signed)
SW reached out in regards to [name] weight loss journey. He was seeing [facility] several years ago, but was not able to undergo interventions secondary to his kidney function. He would like a referral back for reconsideration of surgical interventions.  ?

## 2021-04-07 NOTE — Telephone Encounter (Signed)
?  Chronic Care Management  ? ?Note ? ?04/07/2021 ?Name: [name] MRN: [id number] DOB: 1975/03/03 ? ?Patient requested referral to bariatric surgeon and referral was entered for patient.  This RNCM called patient to let him know referral was completed he told me he got his kidney doctor to refer him and for [facility] to cancel referral.  Notified referral coordinator [employer]. ?[name], RN, BSN, CCM ?Care Management Coordinator ?[facility] ?Phone: [phone]/QNV: [phone]  ?

## 2021-04-11 ENCOUNTER — Other Ambulatory Visit: Payer: Self-pay | Admitting: Student

## 2021-04-11 ENCOUNTER — Other Ambulatory Visit: Payer: Self-pay | Admitting: Internal Medicine

## 2021-04-26 ENCOUNTER — Ambulatory Visit (HOSPITAL_COMMUNITY)
Admission: RE | Admit: 2021-04-26 | Discharge: 2021-04-26 | Disposition: A | Discharge status: Home / Self Care | Payer: Medicare Other | Source: Ambulatory Visit | Attending: Nephrology | Admitting: Nephrology

## 2021-04-26 VITALS — BP 151/82 | HR 87 | Temp 97.4°F

## 2021-04-26 DIAGNOSIS — D631 Anemia in chronic kidney disease: Secondary | ICD-10-CM | POA: Insufficient documentation

## 2021-04-26 DIAGNOSIS — N183 Chronic kidney disease, stage 3 unspecified: Secondary | ICD-10-CM | POA: Diagnosis not present

## 2021-04-26 DIAGNOSIS — N2889 Other specified disorders of kidney and ureter: Secondary | ICD-10-CM

## 2021-04-26 LAB — RENAL FUNCTION PANEL
Albumin: 3.4 g/dL — ABNORMAL LOW (ref 3.5–5.0)
Anion gap: 9 (ref 5–15)
BUN: 75 mg/dL — ABNORMAL HIGH (ref 6–20)
CO2: 23 mmol/L (ref 22–32)
Calcium: 9.1 mg/dL (ref 8.9–10.3)
Chloride: 104 mmol/L (ref 98–111)
Creatinine, Ser: 3.61 mg/dL — ABNORMAL HIGH (ref 0.61–1.24)
GFR, Estimated: 20 mL/min — ABNORMAL LOW (ref >60)
Glucose, Bld: 130 mg/dL — ABNORMAL HIGH (ref 70–99)
Phosphorus: 4.1 mg/dL (ref 2.5–4.6)
Potassium: 3.8 mmol/L (ref 3.5–5.1)
Sodium: 136 mmol/L (ref 135–145)

## 2021-04-26 LAB — POCT HEMOGLOBIN-HEMACUE: Hemoglobin: 11.5 g/dL — ABNORMAL LOW (ref 13.0–17.0)

## 2021-04-26 LAB — FERRITIN: Ferritin: 280 ng/mL (ref 24–336)

## 2021-04-26 LAB — IRON AND TIBC
Iron: 57 ug/dL (ref 45–182)
Saturation Ratios: 19 % (ref 17.9–39.5)
TIBC: 298 ug/dL (ref 250–450)
UIBC: 241 ug/dL

## 2021-04-26 MED ORDER — DARBEPOETIN ALFA 200 MCG/0.4ML IJ SOSY
200.0000 ug | PREFILLED_SYRINGE | INTRAMUSCULAR | Status: DC
  Administered 2021-04-26: 200 ug via SUBCUTANEOUS

## 2021-04-26 MED ORDER — DARBEPOETIN ALFA 200 MCG/0.4ML IJ SOSY
PREFILLED_SYRINGE | INTRAMUSCULAR | Status: AC
  Filled 2021-04-26: qty 0.4

## 2021-04-27 DIAGNOSIS — E876 Hypokalemia: Secondary | ICD-10-CM | POA: Diagnosis not present

## 2021-04-27 DIAGNOSIS — E113312 Type 2 diabetes mellitus with moderate nonproliferative diabetic retinopathy with macular edema, left eye: Secondary | ICD-10-CM | POA: Diagnosis not present

## 2021-04-27 DIAGNOSIS — Z86718 Personal history of other venous thrombosis and embolism: Secondary | ICD-10-CM | POA: Diagnosis not present

## 2021-04-27 DIAGNOSIS — N2581 Secondary hyperparathyroidism of renal origin: Secondary | ICD-10-CM | POA: Diagnosis not present

## 2021-04-27 DIAGNOSIS — Z794 Long term (current) use of insulin: Secondary | ICD-10-CM | POA: Diagnosis not present

## 2021-04-27 DIAGNOSIS — E1122 Type 2 diabetes mellitus with diabetic chronic kidney disease: Secondary | ICD-10-CM | POA: Diagnosis not present

## 2021-04-27 DIAGNOSIS — E669 Obesity, unspecified: Secondary | ICD-10-CM | POA: Diagnosis not present

## 2021-04-27 DIAGNOSIS — I129 Hypertensive chronic kidney disease with stage 1 through stage 4 chronic kidney disease, or unspecified chronic kidney disease: Secondary | ICD-10-CM | POA: Diagnosis not present

## 2021-04-27 DIAGNOSIS — N184 Chronic kidney disease, stage 4 (severe): Secondary | ICD-10-CM | POA: Diagnosis not present

## 2021-04-27 DIAGNOSIS — D631 Anemia in chronic kidney disease: Secondary | ICD-10-CM | POA: Diagnosis not present

## 2021-04-27 LAB — PTH, INTACT AND CALCIUM
Calcium, Total (PTH): 9.1 mg/dL (ref 8.7–10.2)
PTH: 140 pg/mL — ABNORMAL HIGH (ref 15–65)

## 2021-05-02 ENCOUNTER — Other Ambulatory Visit: Payer: Self-pay | Admitting: Internal Medicine

## 2021-05-06 ENCOUNTER — Other Ambulatory Visit: Payer: Self-pay | Admitting: Student

## 2021-05-09 ENCOUNTER — Other Ambulatory Visit: Payer: Self-pay | Admitting: Internal Medicine

## 2021-05-16 ENCOUNTER — Ambulatory Visit: Payer: Medicare Other

## 2021-05-16 ENCOUNTER — Other Ambulatory Visit (INDEPENDENT_AMBULATORY_CARE_PROVIDER_SITE_OTHER): Payer: Medicare Other

## 2021-05-16 ENCOUNTER — Other Ambulatory Visit: Payer: Self-pay | Admitting: Endocrinology

## 2021-05-16 DIAGNOSIS — E1165 Type 2 diabetes mellitus with hyperglycemia: Secondary | ICD-10-CM | POA: Diagnosis not present

## 2021-05-16 DIAGNOSIS — Z794 Long term (current) use of insulin: Secondary | ICD-10-CM

## 2021-05-16 LAB — GLUCOSE, RANDOM: Glucose, Bld: 126 mg/dL — ABNORMAL HIGH (ref 70–99)

## 2021-05-16 LAB — HEMOGLOBIN A1C: Hgb A1c MFr Bld: 5.5 % (ref 4.6–6.5)

## 2021-05-16 NOTE — Patient Instructions (Signed)
Visit Information ? ?Thank you for taking time to visit with me today. Please don't hesitate to contact me if I can be of assistance to you before our next scheduled telephone appointment. ? ?Our next appointment is by telephone on 07/25/21 at 1:45. ? ?Please call the care guide team at [phone] if you need to cancel or reschedule your appointment.  ? ?If you are experiencing a Mental Health or [place] or need someone to talk to, please call the [place] National Suicide Prevention Lifeline: 323 790 1536 or TTY: 351-155-8412 TTY [phone]) to talk to a trained counselor  ? ?Patient verbalizes understanding of instructions and care plan provided today and agrees to view in [place]. Active MyChart status confirmed with patient.   ? ?The patient has been provided with contact information for the care management team and has been advised to call with any health related questions or concerns.  ? ?[name], RN, BSN, CCM ?Care Management Coordinator ?[facility] ?Phone: [phone]/JGG: [phone]  ?

## 2021-05-16 NOTE — Chronic Care Management (AMB) (Addendum)
Care Management    RN Visit Note  05/16/2021 Name: [name] MRN: [id number] DOB: [birth year]  Subjective:  Closing case as patient getting care at [facility]. [name] is a 46 y.o. year old male who is a primary care patient of Victor Mercury, MD. The care management team was consulted for assistance with disease management and care coordination needs.    Engaged with patient by telephone for follow up visit in response to provider referral for case management and/or care coordination services.   Consent to Services:   [name] was given information about Care Management services today including:  Care Management services includes personalized support from designated clinical staff supervised by his physician, including individualized plan of care and coordination with other care providers 24/7 contact phone numbers for assistance for urgent and routine care needs. The patient may stop case management services at any time by phone call to the office staff.  Patient agreed to services and consent obtained.   Assessment: Review of patient past medical history, allergies, medications, health status, including review of consultants reports, laboratory and other test data, was performed as part of comprehensive evaluation and provision of chronic care management services.   SDOH (Social Determinants of Health) assessments and interventions performed:    Care Plan  Allergies  Allergen Reactions   Reglan [Metoclopramide] Other (See Comments)    Dysphoric reaction   Metformin And Related Diarrhea   Ozempic (0.25 Or 0.5 Mg-Dose) [Semaglutide(0.25 Or 0.82m-Dos)] Nausea And Vomiting    Outpatient Encounter Medications as of 05/16/2021  Medication Sig Note   carvedilol (COREG) 25 MG tablet TAKE 1 TABLET BY MOUTH TWICE DAILY WITH A MEAL    Accu-Chek Softclix Lancets lancets Check blood sugar up to 3 times a day (Patient not taking: Reported on 02/09/2021)    acidophilus (RISAQUAD) CAPS  capsule Take 1 capsule by mouth daily.    Alpha-Lipoic Acid 600 MG CAPS Take 600 mg by mouth daily.    amLODipine (NORVASC) 10 MG tablet Take 10 mg by mouth at bedtime.    Blood Glucose Monitoring Suppl (ACCU-CHEK GUIDE ME) w/Device KIT Use to check blood sugar up to 3 times a day (Patient not taking: Reported on 02/09/2021)    Cyanocobalamin (B-12) 500 MCG SUBL Place 500 mcg under the tongue daily.     cyclobenzaprine (FLEXERIL) 5 MG tablet Take 1 tablet (5 mg total) by mouth at bedtime as needed for muscle spasms.    diclofenac Sodium (VOLTAREN) 1 % GEL Apply 2 g topically 4 (four) times daily.    ELIQUIS 5 MG TABS tablet Take 1 tablet by mouth twice daily    famotidine (PEPCID) 20 MG tablet Take 1 tablet (20 mg total) by mouth 2 (two) times daily. (Patient taking differently: Take 20 mg by mouth 2 (two) times daily as needed for heartburn.) 11/28/2019: .   fluticasone (FLONASE) 50 MCG/ACT nasal spray Place 2 sprays into both nostrils daily. (Patient taking differently: Place 2 sprays into both nostrils daily as needed for allergies.)    furosemide (LASIX) 80 MG tablet Take 1 tablet by mouth twice daily (Patient taking differently: Take 80 mg by mouth 2 (two) times daily.)    gabapentin (NEURONTIN) 100 MG capsule Take 1 capsule (100 mg total) by mouth 3 (three) times daily.    glucose blood (ACCU-CHEK GUIDE) test strip Check blood sugar up to 3 times a day (Patient not taking: Reported on 02/09/2021)    hydrALAZINE (APRESOLINE) 100 MG tablet Take 100  mg by mouth 3 (three) times daily.    insulin degludec (TRESIBA FLEXTOUCH) 200 UNIT/ML FlexTouch Pen INJECT 96 UNITS SUBCUTANEOUSLY ONCE DAILY    Insulin Pen Needle (B-D UF III MINI PEN NEEDLES) 31G X 5 MM MISC 1 each by Other route See admin instructions. USE 4-5 TIMES DAILY    Insulin Pen Needle (B-D UF III MINI PEN NEEDLES) 31G X 5 MM MISC USE 1  FIVE TIMES DAILY    Lancet Devices (ACCU-CHEK SOFTCLIX) lancets Use to check blood sugars 5 times a day. Dx  code:E11.9. Insulin dependent. (Patient taking differently: 1 each by Other route See admin instructions. Use to check blood sugars 5 times a day. Dx code:E11.9. Insulin dependent.)    lovastatin (MEVACOR) 20 MG tablet Take 1 tablet by mouth once daily    NOVOLOG FLEXPEN 100 UNIT/ML FlexPen Inject 15-30 Units into the skin 3 (three) times daily with meals. Administer 15 units in the morning with breakfast, 25 units with lunch, and 30 units with dinner daily.  ICD Code:  E11.9    oxybutynin (DITROPAN) 5 MG tablet TAKE 1 TABLET BY MOUTH THREE TIMES DAILY    polyethylene glycol (MIRALAX / GLYCOLAX) 17 g packet Take 17 g by mouth 2 (two) times daily. (Patient taking differently: Take 17 g by mouth as needed for mild constipation.)    potassium chloride SA (KLOR-CON) 20 MEQ tablet Take 20 mEq by mouth 2 (two) times daily.    senna-docusate (SENOKOT-S) 8.6-50 MG tablet Take 1 tablet by mouth daily. (Patient taking differently: Take 1 tablet by mouth as needed for mild constipation.)    sildenafil (VIAGRA) 50 MG tablet Take 1-2 tablets (50-100 mg total) by mouth as needed for erectile dysfunction. Take 1 hour before needed. Do not take more than 2 tablets at once.    tamsulosin (FLOMAX) 0.4 MG CAPS capsule Take 1 capsule (0.4 mg total) by mouth at bedtime.    VICTOZA 18 MG/3ML SOPN INJECT 1.8 MG IN THE SKIN ONCE DAILY AT THE SAME TIME.    No facility-administered encounter medications on file as of 05/16/2021.    Patient Active Problem List   Diagnosis Date Noted   Trapezius strain, left, initial encounter 02/02/2021   Left shoulder pain 12/30/2020   Acute pulmonary edema (HCC)    Benign paroxysmal positional vertigo 09/14/2020   Anemia associated with chronic renal failure    Hemoglobinuria 12/12/2018   OSA (obstructive sleep apnea) 02/28/2018   Chronic diastolic heart failure ([place]) 02/02/2018   Preventative health care 08/15/2017   Erectile dysfunction 04/22/2017   History of pulmonary embolus  (PE) 06/09/2016   Depression 05/04/2016   Hypogonadism in male 05/04/2016   Dyslipidemia 08/12/2014   Unilateral AKA, right ([place]) 12/26/2013   Hyperlipidemia 02/19/2013   S/P cardiac cath, 02/18/13, normal coronaries 02/19/2013   Chronic renal insufficiency, stage 3 (moderate) ([place]) 02/19/2013   Hypertension 07/24/2011   Long term current use of anticoagulant therapy 07/19/2011   History of DVT (deep vein thrombosis) 07/28/2009   Morbid obesity ([place]) 10/26/2005   Insulin dependent type 2 diabetes mellitus, controlled ([place]) 01/09/2002    Conditions to be addressed/monitored: DMII and CKD Stage 3  Care Plan : Diabetes Type 2 (Adult)  Updates made by [name], RN since 05/16/2021 12:00 AM     Problem: Currently meeting A1C target but having large fluctuations in blood sugars   Priority: High  Onset Date: 12/15/2019     Goal: Glycemic Management Optimized without large fluctuations in blood  sugars   Start Date: 12/15/2019  Recent Progress: On track  Priority: High  Note:   Objective: Successful outreach to patient for follow up.  Patient notes he is doing well, he had labs drawn today and his A1C was 5.5. He shared that he has completed the seminars for him to get weight loss surgery through [facility].  He is waiting for an appointment with the clinic as all of his forms have been sent in.  Patient also shared that his shower chair has a broken leg and he cannot get through to [employer].  Message sent to Stephannie Peters at [employer] with request for someone to reach out to patient.  Advised patient to call this RNCM if he does not hear from someone from [employer] this week. Lab Results  Component Value Date   HGBA1C 5.5 12/28/2020   HGBA1C 4.7 (L) 10/02/2020   HGBA1C 5.5 07/08/2020   Lab Results  Component Value Date   MICROALBUR 160.61 (H) 08/14/2013   LDLCALC 52 10/26/2020   CREATININE 3.62 (H) 03/15/2021    Current Barriers:  Knowledge Deficits related to basic Diabetes pathophysiology  and self care/management Unable to independently meet pre and post meal CBG targets  Case Manager Clinical Goal(s):  Over the next 30-60 days, patient will demonstrate improved adherence to prescribed treatment plan for diabetes self care/management as evidenced by:  daily monitoring and recording of CBG  adherence to ADA/ carb modified diet exercise 5-7 days/week- adherence to prescribed medication regimen Interventions:  Review of patient status, including review of consultants reports, relevant laboratory and other test results, and medications completed. Assessed satisfaction with Freestyle Libre Flash Glucose monitor Assessed frequency of hypoglycemia and ability to self treat Assessed medication taking behavior Patient encouraged to continue his exercise routine with his personal trainer Discussed plans with patient for ongoing care management follow up and provided patient with direct contact information for care management team:  Reviewed scheduled/upcoming provider appointments including:  - barriers to adherence to treatment plan identified - dental care encouraged - mutual A1C goal set or reviewed - devices to improve adherence to care identified - self-awareness of signs/symptoms of hypo or hyperglycemia encouraged Discussed plans with patient for ongoing care management follow up and provided patient with direct contact information for care management team In the next 30-60 days, patient will:  - Self administers oral medications as prescribed Self administers insulin as prescribed Self administers injectable DM medication Victoza and Tyler Aas as prescribed Attends all scheduled provider appointments Checks blood sugars as prescribed and utilize hyper and hypoglycemia protocol as needed Adheres to prescribed ADA/carb modified Follow Up Plan: The care management team will reach out to the patient again over the next 30-60 days.       Plan: The patient has been provided  with contact information for the care management team and has been advised to call with any health related questions or concerns.   [name], RN, BSN, CCM Care Management Coordinator [facility] Phone: [phone]: [phone]

## 2021-05-17 ENCOUNTER — Encounter (HOSPITAL_COMMUNITY)
Admission: RE | Admit: 2021-05-17 | Discharge: 2021-05-17 | Disposition: A | Discharge status: Home / Self Care | Payer: Medicare Other | Source: Ambulatory Visit | Attending: Nephrology | Admitting: Nephrology

## 2021-05-17 VITALS — BP 130/72 | HR 85 | Temp 98.3°F

## 2021-05-17 DIAGNOSIS — N2889 Other specified disorders of kidney and ureter: Secondary | ICD-10-CM

## 2021-05-17 DIAGNOSIS — N183 Chronic kidney disease, stage 3 unspecified: Secondary | ICD-10-CM | POA: Diagnosis not present

## 2021-05-17 DIAGNOSIS — D631 Anemia in chronic kidney disease: Secondary | ICD-10-CM | POA: Insufficient documentation

## 2021-05-17 LAB — RENAL FUNCTION PANEL
Albumin: 3.3 g/dL — ABNORMAL LOW (ref 3.5–5.0)
Anion gap: 9 (ref 5–15)
BUN: 73 mg/dL — ABNORMAL HIGH (ref 6–20)
CO2: 25 mmol/L (ref 22–32)
Calcium: 8.9 mg/dL (ref 8.9–10.3)
Chloride: 102 mmol/L (ref 98–111)
Creatinine, Ser: 3.99 mg/dL — ABNORMAL HIGH (ref 0.61–1.24)
GFR, Estimated: 18 mL/min — ABNORMAL LOW (ref >60)
Glucose, Bld: 98 mg/dL (ref 70–99)
Phosphorus: 4.3 mg/dL (ref 2.5–4.6)
Potassium: 3.9 mmol/L (ref 3.5–5.1)
Sodium: 136 mmol/L (ref 135–145)

## 2021-05-17 LAB — FRUCTOSAMINE: Fructosamine: 303 umol/L — ABNORMAL HIGH (ref 0–285)

## 2021-05-17 LAB — POCT HEMOGLOBIN-HEMACUE: Hemoglobin: 11.5 g/dL — ABNORMAL LOW (ref 13.0–17.0)

## 2021-05-17 LAB — IRON AND TIBC
Iron: 64 ug/dL (ref 45–182)
Saturation Ratios: 21 % (ref 17.9–39.5)
TIBC: 300 ug/dL (ref 250–450)
UIBC: 236 ug/dL

## 2021-05-17 LAB — FERRITIN: Ferritin: 321 ng/mL (ref 24–336)

## 2021-05-17 MED ORDER — DARBEPOETIN ALFA 200 MCG/0.4ML IJ SOSY
PREFILLED_SYRINGE | INTRAMUSCULAR | Status: AC
  Filled 2021-05-17: qty 0.4

## 2021-05-17 MED ORDER — DARBEPOETIN ALFA 200 MCG/0.4ML IJ SOSY
200.0000 ug | PREFILLED_SYRINGE | INTRAMUSCULAR | Status: DC
  Administered 2021-05-17: 200 ug via SUBCUTANEOUS

## 2021-05-19 ENCOUNTER — Encounter: Payer: Self-pay | Admitting: Endocrinology

## 2021-05-19 ENCOUNTER — Ambulatory Visit (INDEPENDENT_AMBULATORY_CARE_PROVIDER_SITE_OTHER): Payer: Medicare Other | Admitting: Endocrinology

## 2021-05-19 VITALS — BP 128/74 | HR 85 | Ht 74.0 in | Wt 337.0 lb

## 2021-05-19 DIAGNOSIS — E1165 Type 2 diabetes mellitus with hyperglycemia: Secondary | ICD-10-CM | POA: Diagnosis not present

## 2021-05-19 DIAGNOSIS — Z794 Long term (current) use of insulin: Secondary | ICD-10-CM | POA: Diagnosis not present

## 2021-05-19 MED ORDER — TIRZEPATIDE 2.5 MG/0.5ML ~~LOC~~ SOAJ: 2.5000 mg | SUBCUTANEOUS | 0 refills | Status: DC

## 2021-05-19 NOTE — Progress Notes (Signed)
Patient ID: [name], male   DOB: [birth year], 46 y.o.   MRN: [id number] ?       ? ? ?Reason for Appointment: Follow-up for Type 2 Diabetes ? ? ? ?History of Present Illness:  ?        ?Date of diagnosis of type 2 diabetes mellitus:  2001     ? ?His weight was about 400 pounds at the time of diagnosis ?He does not know what his initial blood sugars and circumstances were but he apparently was started on insulin initially ?He was not able to tolerate metformin because of diarrhea ?Subsequently has been on insulin only, level of control not available but his A1c was significantly high at 12.8 in 2015 ? ?Background history:   ? ?Recent history:  ? ?INSULIN regimen is:   48 units [place] once daily ? NovoLog 0 units breakfast 20  units before lunch and 25 units at dinner  ? ?Non-insulin hypoglycemic drugs the patient is taking are:  Victoza: 1.8 mg daily ? ? ?Current management, blood sugar patterns and problems identified: ? ? ?His A1c is 5.5, about the same ?Fructosamine is previously at 326 ? ? ?Interpretation of his blood sugar patterns from his freestyle Elenor Legato are as follows: ?Blood sugars overall are showing more variability in higher readings compared to the last time  ?His blood sugars are mostly averaging 160-180 at any given time of the day except much lower between 8 AM-12 PM  ?Overnight blood sugars are running about 160-190s starting after 10 PM without much variability  ?Postprandial readings after breakfast which is around 4:30 AM are increasing mildly but variably  ?POSTPRANDIAL readings after lunch and dinner are generally leveled with the [place] readings but some variability and at least one significant high episode in the evening and once overnight  ?Blood sugars may go up mildly after his bedtime snack also  ?He has fairly consistent decrease in his blood sugars between 6 AM-10 AM with readings this week going as low as 58 but usually hypoglycemia is a very transient ?No other  hypoglycemia ? ?Management, insulin regimen and problems: ? ?He has been working harder on his weight loss with diet and exercise regimen but is not able to lose weight again  ?He has been taking his Victoza consistently now  ?Also trying to modify his carbohydrate intake and continue to avoid any high fat meals or snacks  ?He is trying to do more exercise around 6 PM also with boxing which gives him a good workout  ?With his not taking NOVOLOG before breakfast he is not getting hypoglycemia in the mornings  ?He only tends to get some hypoglycemia late morning or afternoon possibly from over dosing his NovoLog for the type of meal he is having at lunch or because of exercise in the morning ?He is asking about [name] but this has not been covered by insurance before ?Currently blood sugars are on an average in the low 100s most of the time except somewhat higher late evening ?Also he has tendency to low normal sugars except yesterday during this week ?He has been monitoring his blood sugars fairly consistently ? ? ? ?Side effects from medications have been: Diarrhea with metformin, vomiting with Ozempic ? ?Glucose monitoring:  with freestyle libre version 2 with data as follows ? ? ?CGM use % of time 94  ?2-week average/GV 145  ?Time in range       84%  ?% Time Above 180 12+1  ?% Time  above 250   ?% Time Below 70 3  ? ?  ?PRE-MEAL Fasting Lunch Dinner Bedtime Overall  ?Glucose range:       ?Averages: 108    135  ? ?POST-MEAL PC Breakfast PC Lunch PC Dinner  ?Glucose range:     ?Averages: 119 140 165  ? ?Previously ? ? ?CGM use % of time 75  ?2-week average/GV 159/25  ?Time in range 66  ?% Time Above 180 33+1  ?% Time above 250   ?% Time Below 70 <1  ? ?  ?PRE-MEAL Fasting Lunch Dinner Bedtime Overall  ?Glucose range:       ?Averages: 161 131 154 189 159  ? ?POST-MEAL PC Breakfast PC Lunch PC Dinner  ?Glucose range:     ?Averages: 182 147 177  ? ? ? ? ?Self-care:   ? ?Typical meal intake: Breakfast is egg whites,  some oatmeal/grits and banana.  Usually small portions of meat or other protein at lunch and dinner and only a small amount of sweet potato at lunch or carbohydrate              ?Dietician visit, most recent: Unknown ?             ? ?Weight history: ? ?Wt Readings from Last 3 Encounters:  ?05/19/21 (!) 337 lb (152.9 kg)  ?02/09/21 (!) 338 lb 12.8 oz (153.7 kg)  ?01/31/21 (!) 339 lb 14.4 oz (154.2 kg)  ? ? ?Glycemic control: ?  ?Lab Results  ?Component Value Date  ? HGBA1C 5.5 05/16/2021  ? HGBA1C 5.5 12/28/2020  ? HGBA1C 4.7 (L) 10/02/2020  ? ?Lab Results  ?Component Value Date  ? MICROALBUR 160.61 (H) 08/14/2013  ? [place] 52 10/26/2020  ? CREATININE 3.99 (H) 05/17/2021  ? ?Lab Results  ?Component Value Date  ? MICRALBCREAT 1,528.4 (H) 02/15/2017  ? ? ?Lab Results  ?Component Value Date  ? FRUCTOSAMINE 303 (H) 05/16/2021  ? FRUCTOSAMINE 326 (H) 10/26/2020  ? FRUCTOSAMINE 278 07/15/2020  ? ?Lab Results  ?Component Value Date  ? HGB 11.5 (L) 05/17/2021  ? ? ? ? ?Allergies as of 05/19/2021   ? ?   Reactions  ? Reglan [metoclopramide] Other (See Comments)  ? Dysphoric reaction  ? Metformin And Related Diarrhea  ? Ozempic (0.25 Or 0.5 Mg-dose) [semaglutide(0.25 Or 0.68m-dos)] Nausea And Vomiting  ? ?  ? ?  ?Medication List  ?  ? ?  ? Accurate as of May 19, 2021  1:12 PM. If you have any questions, ask your nurse or doctor.  ?  ?  ? ?  ? ?Accu-Chek Guide Me w/Device Kit ?Use to check blood sugar up to 3 times a day ?  ?Accu-Chek Guide test strip ?Generic drug: glucose blood ?Check blood sugar up to 3 times a day ?  ?accu-chek softclix lancets ?Use to check blood sugars 5 times a day. Dx code:E11.9. Insulin dependent. ?What changed:  ?how much to take ?how to take this ?when to take this ?  ?Accu-Chek Softclix Lancets lancets ?Check blood sugar up to 3 times a day ?  ?acidophilus Caps capsule ?Take 1 capsule by mouth daily. ?  ?Alpha-Lipoic Acid 600 MG Caps ?Take 600 mg by mouth daily. ?  ?amLODipine 10 MG tablet ?Commonly  known as: NORVASC ?Take 10 mg by mouth at bedtime. ?  ?B-12 500 MCG Subl ?Place 500 mcg under the tongue daily. ?  ?B-D UF III MINI PEN NEEDLES 31G X 5 MM Misc ?Generic drug:  Insulin Pen Needle ?1 each by Other route See admin instructions. USE 4-5 TIMES DAILY ?  ?B-D UF III MINI PEN NEEDLES 31G X 5 MM Misc ?Generic drug: Insulin Pen Needle ?USE 1  FIVE TIMES DAILY ?  ?carvedilol 25 MG tablet ?Commonly known as: COREG ?TAKE 1 TABLET BY MOUTH TWICE DAILY WITH A MEAL ?  ?chlorthalidone 25 MG tablet ?Commonly known as: HYGROTON ?Take 25 mg by mouth daily. ?  ?cloNIDine 0.1 mg/24hr patch ?Commonly known as: CATAPRES - Dosed in mg/24 hr ?0.1 mg once a week. ?  ?cyclobenzaprine 5 MG tablet ?Commonly known as: FLEXERIL ?Take 1 tablet (5 mg total) by mouth at bedtime as needed for muscle spasms. ?  ?diclofenac Sodium 1 % Gel ?Commonly known as: Voltaren ?Apply 2 g topically 4 (four) times daily. ?  ?Eliquis 5 MG Tabs tablet ?Generic drug: apixaban ?Take 1 tablet by mouth twice daily ?  ?famotidine 20 MG tablet ?Commonly known as: PEPCID ?Take 1 tablet (20 mg total) by mouth 2 (two) times daily. ?What changed:  ?when to take this ?reasons to take this ?  ?fluticasone 50 MCG/ACT nasal spray ?Commonly known as: Flonase ?Place 2 sprays into both nostrils daily. ?What changed:  ?when to take this ?reasons to take this ?  ?furosemide 80 MG tablet ?Commonly known as: LASIX ?Take 1 tablet by mouth twice daily ?What changed: when to take this ?  ?gabapentin 100 MG capsule ?Commonly known as: NEURONTIN ?Take 1 capsule (100 mg total) by mouth 3 (three) times daily. ?  ?hydrALAZINE 100 MG tablet ?Commonly known as: APRESOLINE ?Take 100 mg by mouth 3 (three) times daily. ?  ?lovastatin 20 MG tablet ?Commonly known as: MEVACOR ?Take 1 tablet by mouth once daily ?  ?NovoLOG FlexPen 100 UNIT/ML FlexPen ?Generic drug: insulin aspart ?Inject 15-30 Units into the skin 3 (three) times daily with meals. Administer 15 units in the morning with  breakfast, 25 units with lunch, and 30 units with dinner daily.  ICD Code:  E11.9 ?  ?oxybutynin 5 MG tablet ?Commonly known as: DITROPAN ?TAKE 1 TABLET BY MOUTH THREE TIMES DAILY ?  ?polyethylene glycol 17 g packet ?Commo

## 2021-05-19 NOTE — Patient Instructions (Addendum)
Take 15 at lunch ? ?Call [facility] about libre 3 ?

## 2021-05-20 DIAGNOSIS — E1122 Type 2 diabetes mellitus with diabetic chronic kidney disease: Secondary | ICD-10-CM | POA: Diagnosis not present

## 2021-05-20 DIAGNOSIS — I129 Hypertensive chronic kidney disease with stage 1 through stage 4 chronic kidney disease, or unspecified chronic kidney disease: Secondary | ICD-10-CM | POA: Diagnosis not present

## 2021-05-20 DIAGNOSIS — N2581 Secondary hyperparathyroidism of renal origin: Secondary | ICD-10-CM | POA: Diagnosis not present

## 2021-05-20 DIAGNOSIS — E876 Hypokalemia: Secondary | ICD-10-CM | POA: Diagnosis not present

## 2021-05-20 DIAGNOSIS — Z86718 Personal history of other venous thrombosis and embolism: Secondary | ICD-10-CM | POA: Diagnosis not present

## 2021-05-20 DIAGNOSIS — D631 Anemia in chronic kidney disease: Secondary | ICD-10-CM | POA: Diagnosis not present

## 2021-05-20 DIAGNOSIS — E669 Obesity, unspecified: Secondary | ICD-10-CM | POA: Diagnosis not present

## 2021-05-20 DIAGNOSIS — Z794 Long term (current) use of insulin: Secondary | ICD-10-CM | POA: Diagnosis not present

## 2021-05-20 DIAGNOSIS — N184 Chronic kidney disease, stage 4 (severe): Secondary | ICD-10-CM | POA: Diagnosis not present

## 2021-05-24 ENCOUNTER — Telehealth: Payer: Self-pay

## 2021-05-24 ENCOUNTER — Other Ambulatory Visit (HOSPITAL_COMMUNITY): Payer: Self-pay

## 2021-05-24 ENCOUNTER — Encounter (HOSPITAL_COMMUNITY): Payer: Self-pay

## 2021-05-24 NOTE — Telephone Encounter (Signed)
Patient called in to check status of mounjaro PA. Informed patient that it is still pending. ?

## 2021-05-24 NOTE — Telephone Encounter (Signed)
Patient Advocate Encounter ?  ?Received notification from [place] that prior authorization for [facility] 2.'5MG'$ /0.5ML pen-injectors is required. ?  ?PA submitted on 05/24/2021 ?Key [id number] ?Status is pending ?

## 2021-05-24 NOTE — Telephone Encounter (Signed)
error 

## 2021-05-25 ENCOUNTER — Telehealth: Payer: Self-pay

## 2021-05-25 ENCOUNTER — Other Ambulatory Visit (HOSPITAL_COMMUNITY): Payer: Self-pay

## 2021-05-25 NOTE — Telephone Encounter (Signed)
Patient Advocate Encounter ? ?Prior Authorization for [facility] 2.'5MG'$ /0.5ML pen-injectors has been approved.   ? ?PA# [id number] ? ?Effective dates: 05/10/2021 until further notice ? ?Not sure of patient's copay. [place] has already filled script, so I am getting a refill too soon on my test claim. (Pharmacy does not open until 9 am) ? ?

## 2021-05-25 NOTE — Telephone Encounter (Signed)
Error. Charted on previous note. ?

## 2021-05-26 NOTE — Telephone Encounter (Signed)
Patient has been notified and will pickup from [place]

## 2021-06-07 ENCOUNTER — Encounter (HOSPITAL_COMMUNITY)
Admission: RE | Admit: 2021-06-07 | Discharge: 2021-06-07 | Disposition: A | Discharge status: Home / Self Care | Payer: Medicare Other | Source: Ambulatory Visit | Attending: Nephrology | Admitting: Nephrology

## 2021-06-07 ENCOUNTER — Other Ambulatory Visit: Payer: Self-pay | Admitting: Internal Medicine

## 2021-06-07 VITALS — BP 133/76 | HR 85 | Temp 97.4°F | Resp 20

## 2021-06-07 DIAGNOSIS — N183 Chronic kidney disease, stage 3 unspecified: Secondary | ICD-10-CM

## 2021-06-07 LAB — FERRITIN: Ferritin: 314 ng/mL (ref 24–336)

## 2021-06-07 LAB — RENAL FUNCTION PANEL
Albumin: 3.4 g/dL — ABNORMAL LOW (ref 3.5–5.0)
Anion gap: 8 (ref 5–15)
BUN: 74 mg/dL — ABNORMAL HIGH (ref 6–20)
CO2: 25 mmol/L (ref 22–32)
Calcium: 8.9 mg/dL (ref 8.9–10.3)
Chloride: 101 mmol/L (ref 98–111)
Creatinine, Ser: 4.09 mg/dL — ABNORMAL HIGH (ref 0.61–1.24)
GFR, Estimated: 18 mL/min — ABNORMAL LOW (ref >60)
Glucose, Bld: 79 mg/dL (ref 70–99)
Phosphorus: 4.1 mg/dL (ref 2.5–4.6)
Potassium: 3.8 mmol/L (ref 3.5–5.1)
Sodium: 134 mmol/L — ABNORMAL LOW (ref 135–145)

## 2021-06-07 LAB — IRON AND TIBC
Iron: 63 ug/dL (ref 45–182)
Saturation Ratios: 22 % (ref 17.9–39.5)
TIBC: 287 ug/dL (ref 250–450)
UIBC: 224 ug/dL

## 2021-06-07 LAB — POCT HEMOGLOBIN-HEMACUE: Hemoglobin: 12.1 g/dL — ABNORMAL LOW (ref 13.0–17.0)

## 2021-06-07 MED ORDER — DARBEPOETIN ALFA 200 MCG/0.4ML IJ SOSY: 200.0000 ug | PREFILLED_SYRINGE | INTRAMUSCULAR | Status: DC

## 2021-06-07 MED ORDER — DARBEPOETIN ALFA 200 MCG/0.4ML IJ SOSY
PREFILLED_SYRINGE | INTRAMUSCULAR | Status: AC
  Filled 2021-06-07: qty 0.4

## 2021-06-10 DIAGNOSIS — E113312 Type 2 diabetes mellitus with moderate nonproliferative diabetic retinopathy with macular edema, left eye: Secondary | ICD-10-CM | POA: Diagnosis not present

## 2021-06-17 ENCOUNTER — Other Ambulatory Visit: Payer: Self-pay | Admitting: Endocrinology

## 2021-06-17 ENCOUNTER — Other Ambulatory Visit: Payer: Self-pay | Admitting: Internal Medicine

## 2021-06-20 ENCOUNTER — Telehealth: Payer: Self-pay

## 2021-06-20 ENCOUNTER — Ambulatory Visit (HOSPITAL_COMMUNITY)
Admission: RE | Admit: 2021-06-20 | Discharge: 2021-06-20 | Disposition: A | Discharge status: Home / Self Care | Payer: Medicare Other | Source: Ambulatory Visit | Attending: Nephrology | Admitting: Nephrology

## 2021-06-20 VITALS — BP 162/79 | HR 85 | Temp 97.5°F | Resp 20

## 2021-06-20 DIAGNOSIS — N183 Chronic kidney disease, stage 3 unspecified: Secondary | ICD-10-CM | POA: Insufficient documentation

## 2021-06-20 DIAGNOSIS — D631 Anemia in chronic kidney disease: Secondary | ICD-10-CM | POA: Insufficient documentation

## 2021-06-20 DIAGNOSIS — E1165 Type 2 diabetes mellitus with hyperglycemia: Secondary | ICD-10-CM

## 2021-06-20 LAB — POCT HEMOGLOBIN-HEMACUE: Hemoglobin: 10.6 g/dL — ABNORMAL LOW (ref 13.0–17.0)

## 2021-06-20 MED ORDER — DARBEPOETIN ALFA 200 MCG/0.4ML IJ SOSY
200.0000 ug | PREFILLED_SYRINGE | INTRAMUSCULAR | Status: DC
  Administered 2021-06-20: 200 ug via SUBCUTANEOUS

## 2021-06-20 MED ORDER — DARBEPOETIN ALFA 200 MCG/0.4ML IJ SOSY
PREFILLED_SYRINGE | INTRAMUSCULAR | Status: AC
  Filled 2021-06-20: qty 0.4

## 2021-06-20 NOTE — Telephone Encounter (Signed)
Do you want patient to increase to '5mg'$  mounjaro or stay at 2.'5mg'$ ? Please advise

## 2021-06-21 ENCOUNTER — Other Ambulatory Visit: Payer: Self-pay | Admitting: Internal Medicine

## 2021-06-21 ENCOUNTER — Other Ambulatory Visit: Payer: Self-pay | Admitting: Endocrinology

## 2021-06-21 ENCOUNTER — Other Ambulatory Visit: Payer: Self-pay | Admitting: Student

## 2021-06-21 DIAGNOSIS — Z794 Long term (current) use of insulin: Secondary | ICD-10-CM | POA: Diagnosis not present

## 2021-06-21 DIAGNOSIS — D631 Anemia in chronic kidney disease: Secondary | ICD-10-CM | POA: Diagnosis not present

## 2021-06-21 DIAGNOSIS — E1122 Type 2 diabetes mellitus with diabetic chronic kidney disease: Secondary | ICD-10-CM | POA: Diagnosis not present

## 2021-06-21 DIAGNOSIS — E876 Hypokalemia: Secondary | ICD-10-CM | POA: Diagnosis not present

## 2021-06-21 DIAGNOSIS — I129 Hypertensive chronic kidney disease with stage 1 through stage 4 chronic kidney disease, or unspecified chronic kidney disease: Secondary | ICD-10-CM | POA: Diagnosis not present

## 2021-06-21 DIAGNOSIS — E669 Obesity, unspecified: Secondary | ICD-10-CM | POA: Diagnosis not present

## 2021-06-21 DIAGNOSIS — N2581 Secondary hyperparathyroidism of renal origin: Secondary | ICD-10-CM | POA: Diagnosis not present

## 2021-06-21 DIAGNOSIS — Z86718 Personal history of other venous thrombosis and embolism: Secondary | ICD-10-CM | POA: Diagnosis not present

## 2021-06-21 DIAGNOSIS — N184 Chronic kidney disease, stage 4 (severe): Secondary | ICD-10-CM | POA: Diagnosis not present

## 2021-06-21 LAB — PTH, INTACT AND CALCIUM
Calcium, Total (PTH): 8.8 mg/dL (ref 8.7–10.2)
PTH: 210 pg/mL — ABNORMAL HIGH (ref 15–65)

## 2021-06-22 ENCOUNTER — Other Ambulatory Visit: Payer: Self-pay

## 2021-06-22 MED ORDER — NOVOLOG FLEXPEN 100 UNIT/ML ~~LOC~~ SOPN: 15.0000 [IU] | PEN_INJECTOR | Freq: Three times a day (TID) | SUBCUTANEOUS | 0 refills | Status: DC

## 2021-06-23 ENCOUNTER — Other Ambulatory Visit: Payer: Self-pay | Admitting: Endocrinology

## 2021-06-23 DIAGNOSIS — Z794 Long term (current) use of insulin: Secondary | ICD-10-CM

## 2021-06-27 ENCOUNTER — Encounter (HOSPITAL_COMMUNITY): Payer: Medicare Other

## 2021-06-29 ENCOUNTER — Other Ambulatory Visit: Payer: Self-pay | Admitting: *Deleted

## 2021-06-29 MED ORDER — OXYBUTYNIN CHLORIDE 5 MG PO TABS: 5.0000 mg | ORAL_TABLET | Freq: Three times a day (TID) | ORAL | 0 refills | Status: DC

## 2021-07-02 ENCOUNTER — Encounter: Payer: Self-pay | Admitting: *Deleted

## 2021-07-07 ENCOUNTER — Other Ambulatory Visit: Payer: Self-pay

## 2021-07-07 MED ORDER — NOVOLOG FLEXPEN 100 UNIT/ML ~~LOC~~ SOPN: 15.0000 [IU] | PEN_INJECTOR | Freq: Three times a day (TID) | SUBCUTANEOUS | 0 refills | Status: DC

## 2021-07-11 ENCOUNTER — Encounter (HOSPITAL_COMMUNITY)
Admission: RE | Admit: 2021-07-11 | Discharge: 2021-07-11 | Disposition: A | Discharge status: Home / Self Care | Payer: Medicare Other | Source: Ambulatory Visit | Attending: Nephrology | Admitting: Nephrology

## 2021-07-11 VITALS — BP 133/61 | HR 101 | Temp 98.2°F | Resp 18

## 2021-07-11 DIAGNOSIS — D631 Anemia in chronic kidney disease: Secondary | ICD-10-CM | POA: Insufficient documentation

## 2021-07-11 DIAGNOSIS — N185 Chronic kidney disease, stage 5: Secondary | ICD-10-CM | POA: Insufficient documentation

## 2021-07-11 DIAGNOSIS — N183 Chronic kidney disease, stage 3 unspecified: Secondary | ICD-10-CM | POA: Insufficient documentation

## 2021-07-11 LAB — RENAL FUNCTION PANEL
Albumin: 3.4 g/dL — ABNORMAL LOW (ref 3.5–5.0)
Anion gap: 7 (ref 5–15)
BUN: 65 mg/dL — ABNORMAL HIGH (ref 6–20)
CO2: 26 mmol/L (ref 22–32)
Calcium: 9 mg/dL (ref 8.9–10.3)
Chloride: 104 mmol/L (ref 98–111)
Creatinine, Ser: 3.48 mg/dL — ABNORMAL HIGH (ref 0.61–1.24)
GFR, Estimated: 21 mL/min — ABNORMAL LOW (ref >60)
Glucose, Bld: 72 mg/dL (ref 70–99)
Phosphorus: 3.7 mg/dL (ref 2.5–4.6)
Potassium: 3.4 mmol/L — ABNORMAL LOW (ref 3.5–5.1)
Sodium: 137 mmol/L (ref 135–145)

## 2021-07-11 LAB — IRON AND TIBC
Iron: 61 ug/dL (ref 45–182)
Saturation Ratios: 23 % (ref 17.9–39.5)
TIBC: 263 ug/dL (ref 250–450)
UIBC: 202 ug/dL

## 2021-07-11 LAB — FERRITIN: Ferritin: 334 ng/mL (ref 24–336)

## 2021-07-11 MED ORDER — DARBEPOETIN ALFA 200 MCG/0.4ML IJ SOSY
PREFILLED_SYRINGE | INTRAMUSCULAR | Status: AC
  Filled 2021-07-11: qty 0.4

## 2021-07-11 MED ORDER — DARBEPOETIN ALFA 200 MCG/0.4ML IJ SOSY
200.0000 ug | PREFILLED_SYRINGE | INTRAMUSCULAR | Status: DC
  Administered 2021-07-11: 200 ug via SUBCUTANEOUS

## 2021-07-13 LAB — POCT HEMOGLOBIN-HEMACUE: Hemoglobin: 10.8 g/dL — ABNORMAL LOW (ref 13.0–17.0)

## 2021-07-14 DIAGNOSIS — E669 Obesity, unspecified: Secondary | ICD-10-CM | POA: Diagnosis not present

## 2021-07-14 DIAGNOSIS — N184 Chronic kidney disease, stage 4 (severe): Secondary | ICD-10-CM | POA: Diagnosis not present

## 2021-07-14 DIAGNOSIS — E1122 Type 2 diabetes mellitus with diabetic chronic kidney disease: Secondary | ICD-10-CM | POA: Diagnosis not present

## 2021-07-14 DIAGNOSIS — N2581 Secondary hyperparathyroidism of renal origin: Secondary | ICD-10-CM | POA: Diagnosis not present

## 2021-07-14 DIAGNOSIS — I129 Hypertensive chronic kidney disease with stage 1 through stage 4 chronic kidney disease, or unspecified chronic kidney disease: Secondary | ICD-10-CM | POA: Diagnosis not present

## 2021-07-14 DIAGNOSIS — Z86718 Personal history of other venous thrombosis and embolism: Secondary | ICD-10-CM | POA: Diagnosis not present

## 2021-07-14 DIAGNOSIS — Z794 Long term (current) use of insulin: Secondary | ICD-10-CM | POA: Diagnosis not present

## 2021-07-14 DIAGNOSIS — E876 Hypokalemia: Secondary | ICD-10-CM | POA: Diagnosis not present

## 2021-07-14 DIAGNOSIS — D631 Anemia in chronic kidney disease: Secondary | ICD-10-CM | POA: Diagnosis not present

## 2021-07-17 ENCOUNTER — Other Ambulatory Visit: Payer: Self-pay | Admitting: Endocrinology

## 2021-07-17 DIAGNOSIS — E1165 Type 2 diabetes mellitus with hyperglycemia: Secondary | ICD-10-CM

## 2021-07-20 ENCOUNTER — Other Ambulatory Visit: Payer: Self-pay | Admitting: Internal Medicine

## 2021-07-20 DIAGNOSIS — I5032 Chronic diastolic (congestive) heart failure: Secondary | ICD-10-CM

## 2021-07-20 MED ORDER — TIRZEPATIDE 7.5 MG/0.5ML ~~LOC~~ SOAJ: 7.5000 mg | SUBCUTANEOUS | 0 refills | Status: DC

## 2021-07-20 NOTE — Addendum Note (Signed)
Addended by: Cinda Quest on: 07/20/2021 02:34 PM   Modules accepted: Orders

## 2021-07-20 NOTE — Telephone Encounter (Signed)
Patient called states he has tolerated the '5mg'$  well and would like to increase to the next dose of Mounjaro. Please advise.

## 2021-07-25 ENCOUNTER — Telehealth: Payer: Medicare Other

## 2021-07-27 DIAGNOSIS — E113212 Type 2 diabetes mellitus with mild nonproliferative diabetic retinopathy with macular edema, left eye: Secondary | ICD-10-CM | POA: Diagnosis not present

## 2021-07-30 ENCOUNTER — Other Ambulatory Visit: Payer: Self-pay | Admitting: Internal Medicine

## 2021-08-01 ENCOUNTER — Encounter (HOSPITAL_COMMUNITY)
Admission: RE | Admit: 2021-08-01 | Discharge: 2021-08-01 | Disposition: A | Discharge status: Home / Self Care | Payer: Medicare Other | Source: Ambulatory Visit | Attending: Nephrology | Admitting: Nephrology

## 2021-08-01 VITALS — BP 131/66 | HR 86 | Temp 98.9°F | Resp 16

## 2021-08-01 DIAGNOSIS — N183 Chronic kidney disease, stage 3 unspecified: Secondary | ICD-10-CM | POA: Diagnosis not present

## 2021-08-01 DIAGNOSIS — N185 Chronic kidney disease, stage 5: Secondary | ICD-10-CM | POA: Diagnosis not present

## 2021-08-01 DIAGNOSIS — D631 Anemia in chronic kidney disease: Secondary | ICD-10-CM | POA: Diagnosis not present

## 2021-08-01 LAB — RENAL FUNCTION PANEL
Albumin: 3.3 g/dL — ABNORMAL LOW (ref 3.5–5.0)
Anion gap: 9 (ref 5–15)
BUN: 71 mg/dL — ABNORMAL HIGH (ref 6–20)
CO2: 24 mmol/L (ref 22–32)
Calcium: 8.7 mg/dL — ABNORMAL LOW (ref 8.9–10.3)
Chloride: 103 mmol/L (ref 98–111)
Creatinine, Ser: 3.73 mg/dL — ABNORMAL HIGH (ref 0.61–1.24)
GFR, Estimated: 20 mL/min — ABNORMAL LOW
Glucose, Bld: 102 mg/dL — ABNORMAL HIGH (ref 70–99)
Phosphorus: 4.1 mg/dL (ref 2.5–4.6)
Potassium: 3.7 mmol/L (ref 3.5–5.1)
Sodium: 136 mmol/L (ref 135–145)

## 2021-08-01 LAB — FERRITIN: Ferritin: 288 ng/mL (ref 24–336)

## 2021-08-01 LAB — POCT HEMOGLOBIN-HEMACUE: Hemoglobin: 10.9 g/dL — ABNORMAL LOW (ref 13.0–17.0)

## 2021-08-01 LAB — IRON AND TIBC
Iron: 61 ug/dL (ref 45–182)
Saturation Ratios: 24 % (ref 17.9–39.5)
TIBC: 256 ug/dL (ref 250–450)
UIBC: 195 ug/dL

## 2021-08-01 MED ORDER — DARBEPOETIN ALFA 200 MCG/0.4ML IJ SOSY
200.0000 ug | PREFILLED_SYRINGE | INTRAMUSCULAR | Status: DC
  Administered 2021-08-01: 200 ug via SUBCUTANEOUS

## 2021-08-01 MED ORDER — DARBEPOETIN ALFA 200 MCG/0.4ML IJ SOSY
PREFILLED_SYRINGE | INTRAMUSCULAR | Status: AC
  Filled 2021-08-01: qty 0.4

## 2021-08-14 ENCOUNTER — Other Ambulatory Visit: Payer: Self-pay | Admitting: Endocrinology

## 2021-08-14 DIAGNOSIS — E1165 Type 2 diabetes mellitus with hyperglycemia: Secondary | ICD-10-CM

## 2021-08-15 ENCOUNTER — Other Ambulatory Visit: Payer: Self-pay

## 2021-08-15 DIAGNOSIS — I129 Hypertensive chronic kidney disease with stage 1 through stage 4 chronic kidney disease, or unspecified chronic kidney disease: Secondary | ICD-10-CM | POA: Diagnosis not present

## 2021-08-15 DIAGNOSIS — Z86718 Personal history of other venous thrombosis and embolism: Secondary | ICD-10-CM | POA: Diagnosis not present

## 2021-08-15 DIAGNOSIS — D631 Anemia in chronic kidney disease: Secondary | ICD-10-CM | POA: Diagnosis not present

## 2021-08-15 DIAGNOSIS — E1122 Type 2 diabetes mellitus with diabetic chronic kidney disease: Secondary | ICD-10-CM | POA: Diagnosis not present

## 2021-08-15 DIAGNOSIS — E669 Obesity, unspecified: Secondary | ICD-10-CM | POA: Diagnosis not present

## 2021-08-15 DIAGNOSIS — E876 Hypokalemia: Secondary | ICD-10-CM | POA: Diagnosis not present

## 2021-08-15 DIAGNOSIS — E1165 Type 2 diabetes mellitus with hyperglycemia: Secondary | ICD-10-CM

## 2021-08-15 DIAGNOSIS — N2581 Secondary hyperparathyroidism of renal origin: Secondary | ICD-10-CM | POA: Diagnosis not present

## 2021-08-15 DIAGNOSIS — N184 Chronic kidney disease, stage 4 (severe): Secondary | ICD-10-CM | POA: Diagnosis not present

## 2021-08-15 DIAGNOSIS — Z794 Long term (current) use of insulin: Secondary | ICD-10-CM | POA: Diagnosis not present

## 2021-08-15 MED ORDER — TIRZEPATIDE 7.5 MG/0.5ML ~~LOC~~ SOAJ: 7.5000 mg | SUBCUTANEOUS | 0 refills | Status: DC

## 2021-08-22 ENCOUNTER — Ambulatory Visit (HOSPITAL_COMMUNITY)
Admission: RE | Admit: 2021-08-22 | Discharge: 2021-08-22 | Disposition: A | Discharge status: Home / Self Care | Payer: Medicare Other | Source: Ambulatory Visit | Attending: Nephrology | Admitting: Nephrology

## 2021-08-22 VITALS — BP 125/63 | HR 97 | Temp 98.5°F

## 2021-08-22 DIAGNOSIS — D631 Anemia in chronic kidney disease: Secondary | ICD-10-CM | POA: Insufficient documentation

## 2021-08-22 DIAGNOSIS — N183 Chronic kidney disease, stage 3 unspecified: Secondary | ICD-10-CM | POA: Insufficient documentation

## 2021-08-22 LAB — RENAL FUNCTION PANEL
Albumin: 3.5 g/dL (ref 3.5–5.0)
Anion gap: 10 (ref 5–15)
BUN: 80 mg/dL — ABNORMAL HIGH (ref 6–20)
CO2: 25 mmol/L (ref 22–32)
Calcium: 8.8 mg/dL — ABNORMAL LOW (ref 8.9–10.3)
Chloride: 100 mmol/L (ref 98–111)
Creatinine, Ser: 4.18 mg/dL — ABNORMAL HIGH (ref 0.61–1.24)
GFR, Estimated: 17 mL/min — ABNORMAL LOW (ref >60)
Glucose, Bld: 85 mg/dL (ref 70–99)
Phosphorus: 3.6 mg/dL (ref 2.5–4.6)
Potassium: 3.4 mmol/L — ABNORMAL LOW (ref 3.5–5.1)
Sodium: 135 mmol/L (ref 135–145)

## 2021-08-22 LAB — IRON AND TIBC
Iron: 61 ug/dL (ref 45–182)
Saturation Ratios: 22 % (ref 17.9–39.5)
TIBC: 280 ug/dL (ref 250–450)
UIBC: 219 ug/dL

## 2021-08-22 LAB — FERRITIN: Ferritin: 319 ng/mL (ref 24–336)

## 2021-08-22 LAB — POCT HEMOGLOBIN-HEMACUE: Hemoglobin: 10.6 g/dL — ABNORMAL LOW (ref 13.0–17.0)

## 2021-08-22 MED ORDER — DARBEPOETIN ALFA 200 MCG/0.4ML IJ SOSY
PREFILLED_SYRINGE | INTRAMUSCULAR | Status: AC
  Filled 2021-08-22: qty 0.4

## 2021-08-22 MED ORDER — DARBEPOETIN ALFA 200 MCG/0.4ML IJ SOSY
200.0000 ug | PREFILLED_SYRINGE | INTRAMUSCULAR | Status: DC
  Administered 2021-08-22: 200 ug via SUBCUTANEOUS

## 2021-08-23 ENCOUNTER — Other Ambulatory Visit (INDEPENDENT_AMBULATORY_CARE_PROVIDER_SITE_OTHER): Payer: Medicare Other

## 2021-08-23 DIAGNOSIS — Z794 Long term (current) use of insulin: Secondary | ICD-10-CM

## 2021-08-23 DIAGNOSIS — E1165 Type 2 diabetes mellitus with hyperglycemia: Secondary | ICD-10-CM

## 2021-08-23 LAB — PTH, INTACT AND CALCIUM
Calcium, Total (PTH): 8.9 mg/dL (ref 8.7–10.2)
PTH: 172 pg/mL — ABNORMAL HIGH (ref 15–65)

## 2021-08-23 LAB — HEMOGLOBIN A1C: Hgb A1c MFr Bld: 5.4 % (ref 4.6–6.5)

## 2021-08-23 LAB — GLUCOSE, RANDOM: Glucose, Bld: 94 mg/dL (ref 70–99)

## 2021-08-24 LAB — FRUCTOSAMINE: Fructosamine: 294 umol/L — ABNORMAL HIGH (ref 0–285)

## 2021-08-25 ENCOUNTER — Ambulatory Visit: Payer: Medicare Other | Admitting: Endocrinology

## 2021-09-01 ENCOUNTER — Encounter: Payer: Self-pay | Admitting: Endocrinology

## 2021-09-01 ENCOUNTER — Ambulatory Visit (INDEPENDENT_AMBULATORY_CARE_PROVIDER_SITE_OTHER): Payer: Medicare Other | Admitting: Endocrinology

## 2021-09-01 VITALS — BP 128/76 | HR 89 | Wt 328.4 lb

## 2021-09-01 DIAGNOSIS — I10 Essential (primary) hypertension: Secondary | ICD-10-CM

## 2021-09-01 DIAGNOSIS — Z794 Long term (current) use of insulin: Secondary | ICD-10-CM | POA: Diagnosis not present

## 2021-09-01 DIAGNOSIS — E1165 Type 2 diabetes mellitus with hyperglycemia: Secondary | ICD-10-CM | POA: Diagnosis not present

## 2021-09-01 NOTE — Progress Notes (Signed)
Patient ID: [name], male   DOB: 1975/10/01, 46 y.o.   MRN: [id number]          Reason for Appointment: Follow-up for Type 2 Diabetes    History of Present Illness:          Date of diagnosis of type 2 diabetes mellitus:  2001      His weight was about 400 pounds at the time of diagnosis He does not know what his initial blood sugars and circumstances were but he apparently was started on insulin initially He was not able to tolerate metformin because of diarrhea Subsequently has been on insulin only, level of control not available but his A1c was significantly high at 12.8 in 2015  Background history:    Recent history:   INSULIN regimen is:   48 units Tresiba once daily   NovoLog 0 units breakfast 10-15  units before lunch and 15-20 units at dinner   Non-insulin hypoglycemic drugs the patient is taking are:  Victoza: 1.8 mg daily, Mounjaro 7.5 mg weekly   Current management, blood sugar patterns and problems identified:   His A1c is 5.4, about the same Fructosamine is 294, previously at 326  Management, insulin regimen and problems:  He has been switched to [facility] on his last visit and is now going up to 7.5 mg weekly without any side effects However not clear why he has not stopped his Victoza as directed  His weight is slightly improved around 9 pounds  He is still exercising fairly regularly on the mornings  As before he does not take NovoLog when he is exercising for his breakfast and usually has eggs and toast at breakfast and this is about an hour before exercising  He does tend to drop lower during exercise and will eat an apple when his blood sugars are around 70 or below  Overall since starting Mounjaro is taking about 5 minutes less on an average at lunch and dinner but he thinks he is adjusting it mostly based on Premeal readings  Current average blood sugars are mostly between 120-130 at all times except lower after exercise and higher at bedtime     Side effects from medications have been: Diarrhea with metformin, vomiting with Ozempic  Glucose monitoring:  with freestyle libre version 2 with data as follows   Interpretation of his blood sugar patterns from his freestyle libre 2 through 8/24 are as follows: His time in range has improved from 84 to 94%  The sugars overall are showing less variability as compared to before  HYPERGLYCEMIC episodes are generally minimal and mostly sporadically late at night after midnight  Only rarely will have a high reading after lunch Otherwise POSTPRANDIAL readings are fairly evenly controlled at all meals including breakfast As before his blood sugars are usually decreasing after his exercise around 6 AM with periodic tendency to low sugars between 7-8 AM Overnight blood sugars are mildly increased averaging 130s Premeal readings are averaging below No other hypoglycemia except after exercise occasionally   CGM use % of time   2-week average/GV 123/23  Time in range    95    %  % Time Above 180 4  % Time above 250   % Time Below 70 1     PRE-MEAL Fasting Lunch Dinner Bedtime Overall  Glucose range:       Averages: 132   124 123   POST-MEAL PC Breakfast PC Lunch PC Dinner  Glucose range:  Averages: 112 132 121    Previously:  CGM use % of time 94  2-week average/GV 145  Time in range       84%  % Time Above 180 12+1  % Time above 250   % Time Below 70 3     PRE-MEAL Fasting Lunch Dinner Bedtime Overall  Glucose range:       Averages: 108    135   POST-MEAL PC Breakfast PC Lunch PC Dinner  Glucose range:     Averages: 119 140 165     Self-care:    Typical meal intake: Breakfast is egg whites, some oatmeal/grits and banana.  Usually small portions of meat or other protein at lunch and dinner and only a small amount of sweet potato at lunch or carbohydrate              Dietician visit, most recent: Unknown               Weight history:  Wt Readings from Last 3  Encounters:  09/01/21 (!) 328 lb 6.4 oz (149 kg)  05/19/21 (!) 337 lb (152.9 kg)  02/09/21 (!) 338 lb 12.8 oz (153.7 kg)    Glycemic control:   Lab Results  Component Value Date   HGBA1C 5.4 08/23/2021   HGBA1C 5.5 05/16/2021   HGBA1C 5.5 12/28/2020   Lab Results  Component Value Date   MICROALBUR 160.61 (H) 08/14/2013   LDLCALC 52 10/26/2020   CREATININE 4.18 (H) 08/22/2021   Lab Results  Component Value Date   MICRALBCREAT 1,528.4 (H) 02/15/2017    Lab Results  Component Value Date   FRUCTOSAMINE 294 (H) 08/23/2021   FRUCTOSAMINE 303 (H) 05/16/2021   FRUCTOSAMINE 326 (H) 10/26/2020   Lab Results  Component Value Date   HGB 10.6 (L) 08/22/2021      Allergies as of 09/01/2021       Reactions   Reglan [metoclopramide] Other (See Comments)   Dysphoric reaction   Metformin And Related Diarrhea   Ozempic (0.25 Or 0.5 Mg-dose) [semaglutide(0.25 Or 0.68m-dos)] Nausea And Vomiting        Medication List        Accurate as of September 01, 2021  8:28 PM. If you have any questions, ask your nurse or doctor.          Accu-Chek Guide Me w/Device Kit Use to check blood sugar up to 3 times a day   Accu-Chek Guide test strip Generic drug: glucose blood Check blood sugar up to 3 times a day   accu-chek softclix lancets Use to check blood sugars 5 times a day. Dx code:E11.9. Insulin dependent. What changed:  how much to take how to take this when to take this   Accu-Chek Softclix Lancets lancets Check blood sugar up to 3 times a day   acidophilus Caps capsule Take 1 capsule by mouth daily.   Alpha-Lipoic Acid 600 MG Caps Take 600 mg by mouth daily.   amLODipine 10 MG tablet Commonly known as: NORVASC Take 10 mg by mouth at bedtime.   B-12 500 MCG Subl Place 500 mcg under the tongue daily.   B-D UF III MINI PEN NEEDLES 31G X 5 MM Misc Generic drug: Insulin Pen Needle 1 each by Other route See admin instructions. USE 4-5 TIMES DAILY   B-D UF III  MINI PEN NEEDLES 31G X 5 MM Misc Generic drug: Insulin Pen Needle USE 1  FIVE TIMES DAILY   carvedilol 25 MG tablet  Commonly known as: COREG TAKE 1 TABLET BY MOUTH TWICE DAILY WITH A MEAL   chlorthalidone 25 MG tablet Commonly known as: HYGROTON Take 25 mg by mouth daily.   cloNIDine 0.1 mg/24hr patch Commonly known as: CATAPRES - Dosed in mg/24 hr 0.1 mg once a week.   cyclobenzaprine 5 MG tablet Commonly known as: FLEXERIL Take 1 tablet (5 mg total) by mouth at bedtime as needed for muscle spasms.   diclofenac Sodium 1 % Gel Commonly known as: Voltaren Apply 2 g topically 4 (four) times daily.   Eliquis 5 MG Tabs tablet Generic drug: apixaban Take 1 tablet by mouth twice daily   famotidine 20 MG tablet Commonly known as: PEPCID Take 1 tablet (20 mg total) by mouth 2 (two) times daily. What changed:  when to take this reasons to take this   fluticasone 50 MCG/ACT nasal spray Commonly known as: Flonase Place 2 sprays into both nostrils daily. What changed:  when to take this reasons to take this   furosemide 80 MG tablet Commonly known as: LASIX Take 1 tablet by mouth twice daily What changed: when to take this   gabapentin 100 MG capsule Commonly known as: NEURONTIN Take 1 capsule (100 mg total) by mouth 3 (three) times daily.   hydrALAZINE 100 MG tablet Commonly known as: APRESOLINE Take 100 mg by mouth 3 (three) times daily.   lovastatin 20 MG tablet Commonly known as: MEVACOR Take 1 tablet by mouth once daily   NovoLOG FlexPen 100 UNIT/ML FlexPen Generic drug: insulin aspart Inject 15-30 Units into the skin 3 (three) times daily with meals. Administer 15 units in the morning with breakfast, 25 units with lunch, and 30 units with dinner daily.  ICD Code:  E11.9   oxybutynin 5 MG tablet Commonly known as: DITROPAN TAKE 1 TABLET BY MOUTH THREE TIMES DAILY   polyethylene glycol 17 g packet Commonly known as: MIRALAX / GLYCOLAX Take 17 g by mouth 2  (two) times daily. What changed:  when to take this reasons to take this   potassium chloride SA 20 MEQ tablet Commonly known as: KLOR-CON M Take 20 mEq by mouth 2 (two) times daily.   senna-docusate 8.6-50 MG tablet Commonly known as: Senokot-S Take 1 tablet by mouth daily. What changed:  when to take this reasons to take this   sildenafil 50 MG tablet Commonly known as: VIAGRA Take 1-2 tablets (50-100 mg total) by mouth as needed for erectile dysfunction. Take 1 hour before needed. Do not take more than 2 tablets at once.   tamsulosin 0.4 MG Caps capsule Commonly known as: FLOMAX Take 1 capsule (0.4 mg total) by mouth at bedtime.   tirzepatide 7.5 MG/0.5ML Pen Commonly known as: MOUNJARO Inject 7.5 mg into the skin once a week.   Tyler Aas FlexTouch 200 UNIT/ML FlexTouch Pen Generic drug: insulin degludec INJECT 96 UNITS SUBCUTANEOUSLY ONCE DAILY   Victoza 18 MG/3ML Sopn Generic drug: liraglutide INJECT 1.8 MG SUBCUTANEOUSLY ONCE DAILY AT THE SAME TIME        Allergies:  Allergies  Allergen Reactions   Reglan [Metoclopramide] Other (See Comments)    Dysphoric reaction   Metformin And Related Diarrhea   Ozempic (0.25 Or 0.5 Mg-Dose) [Semaglutide(0.25 Or 0.66m-Dos)] Nausea And Vomiting    Past Medical History:  Diagnosis Date   Acute venous embolism and thrombosis of deep vessels of proximal lower extremity (HCC) 07/19/2011   Anemia    Anginal pain ([place]    pt denies  Anxiety    Chest pain, neg MI, normal coronaries by cath 02/18/2013   pt denies   CHF (congestive heart failure) (HCC)    Chronic renal disease, stage IV ([place]) 02/19/2013   Colon polyps    Depression    Diabetic ulcer of right foot ([place])    DVT (deep venous thrombosis) ([place]) 09/2002   patient reports additional DVTs in '06 & '11 (unconfirmed)   ED (erectile dysfunction)    GERD (gastroesophageal reflux disease)    History of blood transfusion    "related to OR" (10/31/2016)    Hyperlipidemia 02/19/2013   Hypertension    Nausea & vomiting    "constant for the last couple weeks" (10/31/2016)   Nephrotic syndrome    Obesity    BMI 44, weight 346 pounds 01/30/14   OSA (obstructive sleep apnea) 02/28/2018   Mild obstructive sleep apnea with an AHI of 9.8/h but severe during REM sleep with an AHI of 33.8/h.  Oxygen saturations dropped to 86% and there was moderate snoring   Peripheral edema    Pneumonia    Prosthesis adjustment 08/17/2016   Pulmonary embolism ([place]) 09/2002   treated with 6 months of warfarin   Pyelonephritis 02/02/2018   Type I diabetes mellitus ([place]) dx'd 2001   Urinary retention     Past Surgical History:  Procedure Laterality Date   AMPUTATION Right 12/22/2013   Procedure: AMPUTATION BELOW KNEE;  Surgeon: [name], MD;  Location: [place];  Service: Orthopedics;  Laterality: Right;   AMPUTATION Right 02/19/2014   Procedure: RIGHT ABOVE KNEE AMPUTATION ;  Surgeon: [name], MD;  Location: [place];  Service: Orthopedics;  Laterality: Right;   blood clot removal  2016   CARDIAC CATHETERIZATION  02/18/2013   normal coronaries   ESOPHAGOGASTRODUODENOSCOPY N/A 11/02/2016   Procedure: ESOPHAGOGASTRODUODENOSCOPY (EGD);  Surgeon: [name], MD;  Location: [facility] ENDOSCOPY;  Service: Endoscopy;  Laterality: N/A;   I & D EXTREMITY Right 12/14/2013   Procedure: IRRIGATION AND DEBRIDEMENT RIGHT FOOT;  Surgeon: [name], MD;  Location: [place];  Service: Orthopedics;  Laterality: Right;   INCISION AND DRAINAGE ABSCESS  2007; 2015   "back"   INCISION AND DRAINAGE OF WOUND Right 12/22/2013   Procedure: I&D RIGHT BUTTOCK;  Surgeon: [name], MD;  Location: [place];  Service: Orthopedics;  Laterality: Right;   LEFT HEART CATHETERIZATION WITH CORONARY ANGIOGRAM N/A 02/18/2013   Procedure: LEFT HEART CATHETERIZATION WITH CORONARY ANGIOGRAM;  Surgeon: [name], MD;  Location: [facility] CATH LAB;  Service: Cardiovascular;  Laterality: N/A;    Family  History  Problem Relation Age of Onset   Heart disease Mother    Diabetes Mother    Diabetes Maternal Uncle    Diabetes Cousin     Social History:  reports that he has never smoked. He has never used smokeless tobacco. He reports current alcohol use. He reports that he does not use drugs.   Review of Systems   Below is a copy of the previous note Lipid history: Has been on lovastatin 20 mg with labs as follows: Prescribed by PCP   Lab Results  Component Value Date   CHOL 105 10/26/2020   HDL 37.40 (L) 10/26/2020   LDLCALC 52 10/26/2020   TRIG 81.0 10/26/2020   CHOLHDL 3 10/26/2020           Hypertension: He is taking clonidine, Norvasc, hydralazine and Coreg followed by nephrologist  BP Readings from Last 3 Encounters:  09/01/21  128/76  08/22/21 125/63  08/01/21 131/66     Most recent eye exam was in 1/61   Complications of diabetes: Nephropathy with renal insufficiency and proteinuria, unknown status of retinopathy, erectile dysfunction  Followed by nephrologist for CKD He is on ESA as well as iron infusions for anemia  Lab Results  Component Value Date   CREATININE 4.18 (H) 08/22/2021   CREATININE 3.73 (H) 08/01/2021   CREATININE 3.48 (H) 07/11/2021    Lab Results  Component Value Date   HGB 10.6 (L) 08/22/2021      LABS:  No visits with results within 1 Week(s) from this visit.  Latest known visit with results is:  Lab on 08/23/2021  Component Date Value Ref Range Status   Glucose, Bld 08/23/2021 94  70 - 99 mg/dL Final   Fructosamine 08/23/2021 294 (H)  0 - 285 umol/L Final   Comment: Published reference interval for apparently healthy subjects between age 28 and 37 is 4 - 285 umol/L and in a poorly controlled diabetic population is 228 - 563 umol/L with a mean of 396 umol/L.    Hgb A1c MFr Bld 08/23/2021 5.4  4.6 - 6.5 % Final   Glycemic Control Guidelines for People with Diabetes:Non Diabetic:  <6%Goal of Therapy: <7%Additional Action  Suggested:  >8%     Physical Examination:  BP 128/76   Pulse 89   Wt (!) 328 lb 6.4 oz (149 kg)   SpO2 98%   BMI 42.16 kg/m       ASSESSMENT:  Diabetes type 2, with morbid obesity  See history of present illness for detailed discussion of current diabetes management, blood sugar patterns and problems identified  His last A1c is falsely low at 5.4 again Fructosamine is relatively better at 294 compared to 303  Is on basal bolus insulin, Mounjaro and Victoza 1.8 mg daily Has not stopped with dose as recommended Compared to Victoza Mounjaro appears to be controlling his blood sugars better and he has lost a little weight Also he has reduced his mealtime insulin slightly Freestyle libre patterns and day-to-day management was discussed in detail Also recently hypoglycemia is less frequent but mostly caused by exercise  Hypertension: Blood pressure excellent, treated by nephrologist   PLAN:    Discussed in detail management of snacks and carbohydrates before exercise Even though he has breakfast without insulin coverage in the morning when he goes for exercise 1 to 2 hours later his blood sugars are dropping and he needs to eat a snack like his apple before starting the exercise at 6 AM as well as keep some glucose tablets handy He will continue adjusting mealtime dose based on his Premeal blood sugar but also adjust more up or down 4 to 6 units based on portions and carbohydrates Also adjust his suppertime dose upward on 5 units based on meal size and carbohydrate Continue to leave off NovoLog at breakfast on the days he is exercising   No change in [place] only his blood sugars are relatively higher both breakfast and dinnertime  Stop Victoza  Follow-up in 4 months   Patient Instructions  Stop Victoza   Total visit time including counseling = 30 minutes    [name] 09/01/2021, 8:28 PM   Note: This office note was prepared with Dragon voice recognition system  technology. Any transcriptional errors that result from this process are unintentional.

## 2021-09-01 NOTE — Patient Instructions (Signed)
Stop Victoza

## 2021-09-02 ENCOUNTER — Encounter: Payer: Self-pay | Admitting: Endocrinology

## 2021-09-13 ENCOUNTER — Other Ambulatory Visit: Payer: Self-pay | Admitting: Student

## 2021-09-13 ENCOUNTER — Encounter (HOSPITAL_COMMUNITY): Payer: Medicare Other

## 2021-09-13 DIAGNOSIS — E113212 Type 2 diabetes mellitus with mild nonproliferative diabetic retinopathy with macular edema, left eye: Secondary | ICD-10-CM | POA: Diagnosis not present

## 2021-09-15 ENCOUNTER — Ambulatory Visit (INDEPENDENT_AMBULATORY_CARE_PROVIDER_SITE_OTHER): Payer: Medicare Other | Admitting: Internal Medicine

## 2021-09-15 ENCOUNTER — Encounter (HOSPITAL_COMMUNITY)
Admission: RE | Admit: 2021-09-15 | Discharge: 2021-09-15 | Disposition: A | Discharge status: Home / Self Care | Payer: Medicare Other | Source: Ambulatory Visit | Attending: Nephrology | Admitting: Nephrology

## 2021-09-15 VITALS — BP 137/64 | HR 82 | Temp 97.6°F | Resp 18

## 2021-09-15 VITALS — BP 134/64 | HR 85 | Temp 98.2°F | Wt 352.9 lb

## 2021-09-15 DIAGNOSIS — Z0000 Encounter for general adult medical examination without abnormal findings: Secondary | ICD-10-CM

## 2021-09-15 DIAGNOSIS — Z89611 Acquired absence of right leg above knee: Secondary | ICD-10-CM | POA: Diagnosis not present

## 2021-09-15 DIAGNOSIS — N183 Chronic kidney disease, stage 3 unspecified: Secondary | ICD-10-CM | POA: Diagnosis not present

## 2021-09-15 DIAGNOSIS — S78111A Complete traumatic amputation at level between right hip and knee, initial encounter: Secondary | ICD-10-CM

## 2021-09-15 DIAGNOSIS — I129 Hypertensive chronic kidney disease with stage 1 through stage 4 chronic kidney disease, or unspecified chronic kidney disease: Secondary | ICD-10-CM | POA: Diagnosis not present

## 2021-09-15 DIAGNOSIS — Z23 Encounter for immunization: Secondary | ICD-10-CM | POA: Diagnosis not present

## 2021-09-15 LAB — RENAL FUNCTION PANEL
Albumin: 3.5 g/dL (ref 3.5–5.0)
Anion gap: 9 (ref 5–15)
BUN: 96 mg/dL — ABNORMAL HIGH (ref 6–20)
CO2: 23 mmol/L (ref 22–32)
Calcium: 9 mg/dL (ref 8.9–10.3)
Chloride: 103 mmol/L (ref 98–111)
Creatinine, Ser: 4.14 mg/dL — ABNORMAL HIGH (ref 0.61–1.24)
GFR, Estimated: 17 mL/min — ABNORMAL LOW (ref >60)
Glucose, Bld: 152 mg/dL — ABNORMAL HIGH (ref 70–99)
Phosphorus: 4.3 mg/dL (ref 2.5–4.6)
Potassium: 3.1 mmol/L — ABNORMAL LOW (ref 3.5–5.1)
Sodium: 135 mmol/L (ref 135–145)

## 2021-09-15 LAB — IRON AND TIBC
Iron: 57 ug/dL (ref 45–182)
Saturation Ratios: 21 % (ref 17.9–39.5)
TIBC: 267 ug/dL (ref 250–450)
UIBC: 210 ug/dL

## 2021-09-15 LAB — FERRITIN: Ferritin: 349 ng/mL — ABNORMAL HIGH (ref 24–336)

## 2021-09-15 MED ORDER — DARBEPOETIN ALFA 200 MCG/0.4ML IJ SOSY: 200.0000 ug | PREFILLED_SYRINGE | INTRAMUSCULAR | Status: DC

## 2021-09-15 MED ORDER — DARBEPOETIN ALFA 200 MCG/0.4ML IJ SOSY
PREFILLED_SYRINGE | INTRAMUSCULAR | Status: AC
  Administered 2021-09-15: 200 ug via SUBCUTANEOUS
  Filled 2021-09-15: qty 0.4

## 2021-09-15 NOTE — Patient Instructions (Signed)
Thank you, [name] for allowing [place] to provide your care today.  Knee I have placed orders for a new prosthesis and also for physical therapy.  Flu shot Im glad you got your flu shot today!  Diabetes Please continue following with endocrinology. I am requesting records from your eye doctor.    I have ordered the following labs for you:  Lab Orders  No laboratory test(s) ordered today      Referrals ordered today:    Referral Orders         Ambulatory referral to Physical Therapy      I have ordered the following medication/changed the following medications:   Stop the following medications: There are no discontinued medications.   Start the following medications: No orders of the defined types were placed in this encounter.    We look forward to seeing you next time. Please call our clinic at [phone] if you have any questions or concerns. The best time to call is Monday-Friday from 9am-4pm, but there is someone available 24/7. If after hours or the weekend, call the main hospital number and ask for the Internal Medicine Resident On-Call. If you need medication refills, please notify your pharmacy one week in advance and they will send [place] a request.   Thank you for trusting me with your care. Wishing you the best!   Christiana Fuchs, [place]

## 2021-09-16 LAB — PTH, INTACT AND CALCIUM
Calcium, Total (PTH): 8.8 mg/dL (ref 8.7–10.2)
PTH: 194 pg/mL — ABNORMAL HIGH (ref 15–65)

## 2021-09-16 NOTE — Assessment & Plan Note (Signed)
Flu vaccine administered

## 2021-09-16 NOTE — Assessment & Plan Note (Signed)
Patient with history of right AKA due to osteomyelitis presents with need for new prosthesis part. He went [employer] 9/6 and was told he needed a new knee joint. A/P: DME order placed for [id number] PT referral for after new joint is avalible

## 2021-09-16 NOTE — Progress Notes (Signed)
Subjective:  CC: prosthesis needs  HPI:  [name] is a 46 y.o. male with a past medical history stated below and presents today for need for new prosthesis part. He was evaluated at [facility] 9/6 with recommendation to get a new knee joint. Please see problem based assessment and plan for additional details.  Past Medical History:  Diagnosis Date   Acute venous embolism and thrombosis of deep vessels of proximal lower extremity (HCC) 07/19/2011   Anemia    Anginal pain (HCC)    pt denies   Anxiety    Chest pain, neg MI, normal coronaries by cath 02/18/2013   pt denies   CHF (congestive heart failure) (HCC)    Chronic renal disease, stage IV ([place]) 02/19/2013   Colon polyps    Depression    Diabetic ulcer of right foot ([place])    DVT (deep venous thrombosis) ([place]) 09/2002   patient reports additional DVTs in '06 & '11 (unconfirmed)   ED (erectile dysfunction)    GERD (gastroesophageal reflux disease)    History of blood transfusion    "related to OR" (10/31/2016)   Hyperlipidemia 02/19/2013   Hypertension    Nausea & vomiting    "constant for the last couple weeks" (10/31/2016)   Nephrotic syndrome    Obesity    BMI 44, weight 346 pounds 01/30/14   OSA (obstructive sleep apnea) 02/28/2018   Mild obstructive sleep apnea with an AHI of 9.8/h but severe during REM sleep with an AHI of 33.8/h.  Oxygen saturations dropped to 86% and there was moderate snoring   Peripheral edema    Pneumonia    Prosthesis adjustment 08/17/2016   Pulmonary embolism ([place]) 09/2002   treated with 6 months of warfarin   Pyelonephritis 02/02/2018   Type I diabetes mellitus ([place]) dx'd 2001   Urinary retention     Current Outpatient Medications on File Prior to Visit  Medication Sig Dispense Refill   Accu-Chek Softclix Lancets lancets Check blood sugar up to 3 times a day (Patient not taking: Reported on 02/09/2021) 300 each 3   acidophilus (RISAQUAD) CAPS capsule Take 1 capsule by  mouth daily.     Alpha-Lipoic Acid 600 MG CAPS Take 600 mg by mouth daily.     amLODipine (NORVASC) 10 MG tablet Take 10 mg by mouth at bedtime.     Blood Glucose Monitoring Suppl (ACCU-CHEK GUIDE ME) w/Device KIT Use to check blood sugar up to 3 times a day (Patient not taking: Reported on 02/09/2021) 1 kit 1   carvedilol (COREG) 25 MG tablet TAKE 1 TABLET BY MOUTH TWICE DAILY WITH A MEAL 180 tablet 2   chlorthalidone (HYGROTON) 25 MG tablet Take 25 mg by mouth daily.     cloNIDine (CATAPRES - DOSED IN MG/24 HR) 0.1 mg/24hr patch 0.1 mg once a week.     Cyanocobalamin (B-12) 500 MCG SUBL Place 500 mcg under the tongue daily.      cyclobenzaprine (FLEXERIL) 5 MG tablet Take 1 tablet (5 mg total) by mouth at bedtime as needed for muscle spasms. 10 tablet 0   diclofenac Sodium (VOLTAREN) 1 % GEL Apply 2 g topically 4 (four) times daily. 100 g 3   ELIQUIS 5 MG TABS tablet Take 1 tablet by mouth twice daily 180 tablet 0   famotidine (PEPCID) 20 MG tablet Take 1 tablet (20 mg total) by mouth 2 (two) times daily. (Patient taking differently: Take 20 mg by mouth 2 (two) times daily as  needed for heartburn.) 30 tablet 0   fluticasone (FLONASE) 50 MCG/ACT nasal spray Place 2 sprays into both nostrils daily. (Patient taking differently: Place 2 sprays into both nostrils daily as needed for allergies.) 15.8 mL 2   furosemide (LASIX) 80 MG tablet Take 1 tablet by mouth twice daily (Patient taking differently: Take 80 mg by mouth 2 (two) times daily.) 180 tablet 0   gabapentin (NEURONTIN) 100 MG capsule Take 1 capsule (100 mg total) by mouth 3 (three) times daily. 270 capsule 3   glucose blood (ACCU-CHEK GUIDE) test strip Check blood sugar up to 3 times a day (Patient not taking: Reported on 02/09/2021) 300 each 3   hydrALAZINE (APRESOLINE) 100 MG tablet Take 100 mg by mouth 3 (three) times daily.     insulin degludec (TRESIBA FLEXTOUCH) 200 UNIT/ML FlexTouch Pen INJECT 96 UNITS SUBCUTANEOUSLY ONCE DAILY 45 mL 0    Insulin Pen Needle (B-D UF III MINI PEN NEEDLES) 31G X 5 MM MISC 1 each by Other route See admin instructions. USE 4-5 TIMES DAILY 200 each 0   Insulin Pen Needle (B-D UF III MINI PEN NEEDLES) 31G X 5 MM MISC USE 1  FIVE TIMES DAILY 400 each 0   Lancet Devices (ACCU-CHEK SOFTCLIX) lancets Use to check blood sugars 5 times a day. Dx code:E11.9. Insulin dependent. (Patient taking differently: 1 each by Other route See admin instructions. Use to check blood sugars 5 times a day. Dx code:E11.9. Insulin dependent.) 6 each 3   lovastatin (MEVACOR) 20 MG tablet Take 1 tablet by mouth once daily 90 tablet 1   NOVOLOG FLEXPEN 100 UNIT/ML FlexPen Inject 15-30 Units into the skin 3 (three) times daily with meals. Administer 15 units in the morning with breakfast, 25 units with lunch, and 30 units with dinner daily.  ICD Code:  E11.9 15 mL 0   oxybutynin (DITROPAN) 5 MG tablet TAKE 1 TABLET BY MOUTH THREE TIMES DAILY 90 tablet 2   polyethylene glycol (MIRALAX / GLYCOLAX) 17 g packet Take 17 g by mouth 2 (two) times daily. (Patient taking differently: Take 17 g by mouth as needed for mild constipation.) 14 each 0   potassium chloride SA (KLOR-CON) 20 MEQ tablet Take 20 mEq by mouth 2 (two) times daily.     senna-docusate (SENOKOT-S) 8.6-50 MG tablet Take 1 tablet by mouth daily. (Patient taking differently: Take 1 tablet by mouth as needed for mild constipation.) 90 tablet 3   sildenafil (VIAGRA) 50 MG tablet Take 1-2 tablets (50-100 mg total) by mouth as needed for erectile dysfunction. Take 1 hour before needed. Do not take more than 2 tablets at once. 20 tablet 0   tamsulosin (FLOMAX) 0.4 MG CAPS capsule Take 1 capsule (0.4 mg total) by mouth at bedtime. 90 capsule 3   tirzepatide (MOUNJARO) 7.5 MG/0.5ML Pen Inject 7.5 mg into the skin once a week. 2 mL 0   No current facility-administered medications on file prior to visit.    Family History  Problem Relation Age of Onset   Heart disease Mother    Diabetes  Mother    Diabetes Maternal Uncle    Diabetes Cousin     Social History   Socioeconomic History   Marital status: Kelley    Spouse name: Not on file   Number of children: 0   Years of education: 15.5   Highest education level: Some college, no degree  Occupational History   Occupation: student    Comment: [employer]   Occupation:  disability  Tobacco Use   Smoking status: Never   Smokeless tobacco: Never  Vaping Use   Vaping Use: Never used  Substance and Sexual Activity   Alcohol use: Yes    Comment: 10/31/2016 "a few beers q 6 months or so"   Drug use: No   Sexual activity: Yes  Other Topics Concern   Not on file  Social History Narrative   Financial assistance approved for 100% discount at [facility] and has [facility] card per [employer]   09/01/2009.      Lives in [place] with mother and sister.            Social Determinants of Health   Financial Resource Strain: Medium Risk (10/01/2020)   Overall Financial Resource Strain (CARDIA)    Difficulty of Paying Living Expenses: Somewhat hard  Food Insecurity: No Food Insecurity (10/01/2020)   Hunger Vital Sign    Worried About Running Out of Food in the Last Year: Never true    Ran Out of Food in the Last Year: Never true  Transportation Needs: No Transportation Needs (10/01/2020)   PRAPARE - Hydrologist (Medical): No    Lack of Transportation (Non-Medical): No  Physical Activity: Sufficiently Active (08/20/2019)   Exercise Vital Sign    Days of Exercise per Week: 6 days    Minutes of Exercise per Session: 60 min  Stress: No Stress Concern Present (08/20/2019)   [place]    Feeling of Stress : Not at all  Social Connections: Socially Isolated (08/20/2019)   Social Connection and Isolation Panel [NHANES]    Frequency of Communication with Friends and Family: More than three times a week    Frequency of Social Gatherings with Friends  and Family: More than three times a week    Attends Religious Services: Never    Marine scientist or Organizations: No    Attends Archivist Meetings: Never    Marital Status: Never married  Human resources officer Violence: Not on file    Review of Systems: ROS negative except for what is noted on the assessment and plan.  Objective:   Vitals:   09/15/21 1356  BP: 134/64  Pulse: 85  Temp: 98.2 F (36.8 C)  TempSrc: Oral  SpO2: 95%  Weight: (!) 352 lb 14.4 oz (160.1 kg)    Physical Exam: Constitutional: well-appearing, sitting in wheelchair, in no acute distress Cardiovascular: regular rate and rhythm, no m/r/g Pulmonary/Chest: normal work of breathing on room air, lungs clear to auscultation bilaterally MSK: right AKA Neurological: alert & oriented x 3 Psych: normal mood and affect     Assessment & Plan:  Unilateral AKA, right (HCC) Patient with history of right AKA due to osteomyelitis presents with need for new prosthesis part. He went [facility] 9/6 and was told he needed a new knee joint. A/P: DME order placed for [facility] PT referral for after new joint is avalible  Preventative health care Flu vaccine administered.   Patient discussed with Dr. Brent General [name], D.O. [facility]  PGY-2 Pager: [phone]  Phone: [phone] Date 09/16/2021  Time 7:33 AM

## 2021-09-22 LAB — POCT HEMOGLOBIN-HEMACUE: Hemoglobin: 11.2 g/dL — ABNORMAL LOW (ref 13.0–17.0)

## 2021-09-23 DIAGNOSIS — I129 Hypertensive chronic kidney disease with stage 1 through stage 4 chronic kidney disease, or unspecified chronic kidney disease: Secondary | ICD-10-CM | POA: Diagnosis not present

## 2021-09-23 DIAGNOSIS — N2581 Secondary hyperparathyroidism of renal origin: Secondary | ICD-10-CM | POA: Diagnosis not present

## 2021-09-23 DIAGNOSIS — E876 Hypokalemia: Secondary | ICD-10-CM | POA: Diagnosis not present

## 2021-09-23 DIAGNOSIS — E1122 Type 2 diabetes mellitus with diabetic chronic kidney disease: Secondary | ICD-10-CM | POA: Diagnosis not present

## 2021-09-23 DIAGNOSIS — Z794 Long term (current) use of insulin: Secondary | ICD-10-CM | POA: Diagnosis not present

## 2021-09-23 DIAGNOSIS — N184 Chronic kidney disease, stage 4 (severe): Secondary | ICD-10-CM | POA: Diagnosis not present

## 2021-09-23 DIAGNOSIS — D631 Anemia in chronic kidney disease: Secondary | ICD-10-CM | POA: Diagnosis not present

## 2021-09-26 NOTE — Progress Notes (Signed)
Internal Medicine Clinic Attending  Case discussed with Dr. Masters  At the time of the visit.  We reviewed the resident's history and exam and pertinent patient test results.  I agree with the assessment, diagnosis, and plan of care documented in the resident's note.  

## 2021-10-06 ENCOUNTER — Telehealth: Payer: Self-pay

## 2021-10-06 ENCOUNTER — Inpatient Hospital Stay (HOSPITAL_COMMUNITY): Admission: RE | Admit: 2021-10-06 | Payer: Medicare Other | Source: Ambulatory Visit

## 2021-10-06 NOTE — Telephone Encounter (Signed)
Patient called regarding his prosthetic leg. Patient is requesting a letter to be sent to [facility] [address],explaining why he needs a prosthetic leg for insurance purposes. Patient is requesting the letter to specifically say "The prosthetic knee will improve the quality of my life and will help with his sickness and activity level". Patient is requesting a call back to discuss further detail.

## 2021-10-07 ENCOUNTER — Encounter: Payer: Self-pay | Admitting: Internal Medicine

## 2021-10-07 NOTE — Progress Notes (Signed)
I called and spoke with patient. A letter for medical necessity for new prosthetic will be mailed to [facility] as well as the patient. I will print 2 copies of letter and send message to Ms. Mamie Hague requesting this be faxed to [employer].

## 2021-10-09 ENCOUNTER — Other Ambulatory Visit: Payer: Self-pay | Admitting: Endocrinology

## 2021-10-09 ENCOUNTER — Other Ambulatory Visit: Payer: Self-pay | Admitting: Internal Medicine

## 2021-10-09 DIAGNOSIS — E1165 Type 2 diabetes mellitus with hyperglycemia: Secondary | ICD-10-CM

## 2021-10-10 ENCOUNTER — Other Ambulatory Visit: Payer: Self-pay

## 2021-10-10 DIAGNOSIS — E1165 Type 2 diabetes mellitus with hyperglycemia: Secondary | ICD-10-CM

## 2021-10-10 MED ORDER — NOVOLOG FLEXPEN 100 UNIT/ML ~~LOC~~ SOPN: 15.0000 [IU] | PEN_INJECTOR | Freq: Three times a day (TID) | SUBCUTANEOUS | 0 refills | Status: DC

## 2021-10-10 MED ORDER — TIRZEPATIDE 7.5 MG/0.5ML ~~LOC~~ SOAJ: 7.5000 mg | SUBCUTANEOUS | 2 refills | Status: DC

## 2021-10-10 NOTE — Telephone Encounter (Signed)
I have faxed office notes,demographics,DME order and Mailed a letter to the Patients home.[place] fax number.Office number is [phone] [place], [place] C10/2/202310:38 AM

## 2021-10-10 NOTE — Telephone Encounter (Signed)
Refill request on NOVOLOG FLEXPEN 100 UNIT/ML FlexPen (Expired).

## 2021-10-11 ENCOUNTER — Telehealth: Payer: Self-pay | Admitting: *Deleted

## 2021-10-11 NOTE — Chronic Care Management (AMB) (Unsigned)
  Care Coordination  Outreach Note  10/11/2021 Name: [name] MRN: [id number] DOB: 1975/07/12   Care Coordination Outreach Attempts: An unsuccessful telephone outreach was attempted today to offer the patient information about available care coordination services as a benefit of their health plan.   Follow Up Plan:  Additional outreach attempts will be made to offer the patient care coordination information and services.   Encounter Outcome:  No Answer  [place]  Direct Dial: [phone]

## 2021-10-12 ENCOUNTER — Other Ambulatory Visit: Payer: Self-pay | Admitting: Surgical Oncology

## 2021-10-12 ENCOUNTER — Other Ambulatory Visit (HOSPITAL_COMMUNITY): Payer: Self-pay | Admitting: Surgical Oncology

## 2021-10-12 DIAGNOSIS — E118 Type 2 diabetes mellitus with unspecified complications: Secondary | ICD-10-CM | POA: Diagnosis not present

## 2021-10-12 DIAGNOSIS — K219 Gastro-esophageal reflux disease without esophagitis: Secondary | ICD-10-CM | POA: Diagnosis not present

## 2021-10-12 DIAGNOSIS — I10 Essential (primary) hypertension: Secondary | ICD-10-CM | POA: Diagnosis not present

## 2021-10-12 DIAGNOSIS — Z6841 Body Mass Index (BMI) 40.0 and over, adult: Secondary | ICD-10-CM | POA: Diagnosis not present

## 2021-10-12 NOTE — Chronic Care Management (AMB) (Signed)
  Care Coordination   Note   10/12/2021 Name: [name] MRN: [id number] DOB: Jun 24, 1975  [name] is a 46 y.o. year old male who sees Mapp, Claudia Desanctis, MD for primary care. I reached out to [name] by phone today to offer care coordination services.  [name] was given information about Care Coordination services today including:   The Care Coordination services include support from the care team which includes your Nurse Coordinator, Clinical Social Worker, or Pharmacist.  The Care Coordination team is here to help remove barriers to the health concerns and goals most important to you. Care Coordination services are voluntary, and the patient may decline or stop services at any time by request to their care team member.   Care Coordination Consent Status: Patient agreed to services and verbal consent obtained.   Follow up plan:  Telephone appointment with care coordination team member scheduled for:  10/21/21  Encounter Outcome:  Pt. Scheduled  [place]  Direct Dial: [phone]

## 2021-10-13 ENCOUNTER — Other Ambulatory Visit: Payer: Self-pay

## 2021-10-13 ENCOUNTER — Encounter (HOSPITAL_COMMUNITY)
Admission: RE | Admit: 2021-10-13 | Discharge: 2021-10-13 | Disposition: A | Discharge status: Home / Self Care | Payer: Medicare Other | Source: Ambulatory Visit | Attending: Nephrology | Admitting: Nephrology

## 2021-10-13 VITALS — BP 143/75 | HR 84 | Temp 97.2°F

## 2021-10-13 DIAGNOSIS — N183 Chronic kidney disease, stage 3 unspecified: Secondary | ICD-10-CM | POA: Diagnosis not present

## 2021-10-13 DIAGNOSIS — N185 Chronic kidney disease, stage 5: Secondary | ICD-10-CM | POA: Diagnosis not present

## 2021-10-13 DIAGNOSIS — D631 Anemia in chronic kidney disease: Secondary | ICD-10-CM | POA: Diagnosis not present

## 2021-10-13 LAB — IRON AND TIBC
Iron: 62 ug/dL (ref 45–182)
Saturation Ratios: 22 % (ref 17.9–39.5)
TIBC: 280 ug/dL (ref 250–450)
UIBC: 218 ug/dL

## 2021-10-13 LAB — RENAL FUNCTION PANEL
Albumin: 3.3 g/dL — ABNORMAL LOW (ref 3.5–5.0)
Anion gap: 10 (ref 5–15)
BUN: 85 mg/dL — ABNORMAL HIGH (ref 6–20)
CO2: 23 mmol/L (ref 22–32)
Calcium: 8.9 mg/dL (ref 8.9–10.3)
Chloride: 103 mmol/L (ref 98–111)
Creatinine, Ser: 4.01 mg/dL — ABNORMAL HIGH (ref 0.61–1.24)
GFR, Estimated: 18 mL/min — ABNORMAL LOW (ref >60)
Glucose, Bld: 106 mg/dL — ABNORMAL HIGH (ref 70–99)
Phosphorus: 4.5 mg/dL (ref 2.5–4.6)
Potassium: 3.4 mmol/L — ABNORMAL LOW (ref 3.5–5.1)
Sodium: 136 mmol/L (ref 135–145)

## 2021-10-13 LAB — FERRITIN: Ferritin: 274 ng/mL (ref 24–336)

## 2021-10-13 LAB — POCT HEMOGLOBIN-HEMACUE: Hemoglobin: 10.8 g/dL — ABNORMAL LOW (ref 13.0–17.0)

## 2021-10-13 MED ORDER — NOVOLOG FLEXPEN 100 UNIT/ML ~~LOC~~ SOPN: 15.0000 [IU] | PEN_INJECTOR | Freq: Three times a day (TID) | SUBCUTANEOUS | 0 refills | Status: DC

## 2021-10-13 MED ORDER — DARBEPOETIN ALFA 200 MCG/0.4ML IJ SOSY
PREFILLED_SYRINGE | INTRAMUSCULAR | Status: AC
  Filled 2021-10-13: qty 0.4

## 2021-10-13 MED ORDER — DARBEPOETIN ALFA 200 MCG/0.4ML IJ SOSY
200.0000 ug | PREFILLED_SYRINGE | INTRAMUSCULAR | Status: DC
  Administered 2021-10-13: 200 ug via SUBCUTANEOUS

## 2021-10-13 NOTE — Telephone Encounter (Signed)
Refilled his novolog.

## 2021-10-15 LAB — PTH, INTACT AND CALCIUM
Calcium, Total (PTH): 8.6 mg/dL — ABNORMAL LOW (ref 8.7–10.2)
PTH: 174 pg/mL — ABNORMAL HIGH (ref 15–65)

## 2021-10-21 ENCOUNTER — Ambulatory Visit: Payer: Self-pay

## 2021-10-21 DIAGNOSIS — D631 Anemia in chronic kidney disease: Secondary | ICD-10-CM | POA: Diagnosis not present

## 2021-10-21 DIAGNOSIS — E876 Hypokalemia: Secondary | ICD-10-CM | POA: Diagnosis not present

## 2021-10-21 DIAGNOSIS — N184 Chronic kidney disease, stage 4 (severe): Secondary | ICD-10-CM | POA: Diagnosis not present

## 2021-10-21 DIAGNOSIS — N2581 Secondary hyperparathyroidism of renal origin: Secondary | ICD-10-CM | POA: Diagnosis not present

## 2021-10-21 DIAGNOSIS — E669 Obesity, unspecified: Secondary | ICD-10-CM | POA: Diagnosis not present

## 2021-10-21 DIAGNOSIS — E1122 Type 2 diabetes mellitus with diabetic chronic kidney disease: Secondary | ICD-10-CM | POA: Diagnosis not present

## 2021-10-21 DIAGNOSIS — Z794 Long term (current) use of insulin: Secondary | ICD-10-CM | POA: Diagnosis not present

## 2021-10-21 DIAGNOSIS — I129 Hypertensive chronic kidney disease with stage 1 through stage 4 chronic kidney disease, or unspecified chronic kidney disease: Secondary | ICD-10-CM | POA: Diagnosis not present

## 2021-10-21 DIAGNOSIS — Z86718 Personal history of other venous thrombosis and embolism: Secondary | ICD-10-CM | POA: Diagnosis not present

## 2021-10-21 NOTE — Patient Instructions (Signed)
Visit Information  Thank you for taking time to visit with me today. Please don't hesitate to contact me if I can be of assistance to you.   Following are the goals we discussed today:   Goals Addressed             This Visit's Progress    COMPLETED: Ccare Coordination Activites - no follow up required        Care Coordination Interventions: Active listening / Reflection utilized  Emotional Support Provided Problem [place] strategies reviewed Discussed/.Educated Care Coordination Program 2.   Discussed/.Educated Annual Wellness Visit 3.   Discussed/.Educated Social Determinates of Health 4.   Please inform PCP if services needed in the future The patient expressed an interest in trying intermittent fasting. He mentioned that he had discussed this with his physicians, who advised him not to take his insulin while fasting. Over 90 days, his average meter reading was 131. As we discussed, he is aware of what to do in case of low blood sugar, having experienced readings as low as 55 in the past. Additionally, he maintains a daily workout routine and takes his medications as prescribed.         Please call the care guide team at [phone] if you need to cancel or reschedule your appointment.   If you are experiencing a Mental Health or [place] or need someone to talk to, please call 1-800-273-TALK (toll free, 24 hour hotline)  Patient verbalizes understanding of instructions and care plan provided today and agrees to view in [place]. Active MyChart status and patient understanding of how to access instructions and care plan via MyChart confirmed with patient.     Lazaro Arms RN, BSN, [facility]   Phone: [phone]

## 2021-10-21 NOTE — Patient Outreach (Signed)
  Care Coordination   Initial Visit Note   10/21/2021 Name: [name] MRN: [id number] DOB: 1975-10-05  [name] is a 46 y.o. year old male who sees Mapp, Claudia Desanctis, MD for primary care. I spoke with  [name] by phone today.  What matters to the patients health and wellness today?  I would like to try intermittent fasting.    Goals Addressed             This Visit's Progress    COMPLETED: Ccare Coordination Activites - no follow up required        Care Coordination Interventions: Active listening / Reflection utilized  Emotional Support Provided Problem [place] strategies reviewed Discussed/.Educated Care Coordination Program 2.   Discussed/.Educated Annual Wellness Visit 3.   Discussed/.Educated Social Determinates of Health 4.   Please inform PCP if services needed in the future The patient expressed an interest in trying intermittent fasting. He mentioned that he had discussed this with his physicians, who advised him not to take his insulin while fasting. Over 90 days, his average meter reading was 131. As we discussed, he is aware of what to do in case of low blood sugar, having experienced readings as low as 55 in the past. Additionally, he maintains a daily workout routine and takes his medications as prescribed.        SDOH assessments and interventions completed:  Yes  SDOH Interventions Today    Flowsheet Row Most Recent Value  SDOH Interventions   Food Insecurity Interventions Intervention Not Indicated  Transportation Interventions Intervention Not Indicated        Care Coordination Interventions Activated:  Yes  Care Coordination Interventions:  Yes, provided   Follow up plan: No further intervention required.   Encounter Outcome:  Pt. Visit Completed   Lazaro Arms RN, BSN, [facility]   Phone: [phone]

## 2021-10-25 ENCOUNTER — Other Ambulatory Visit: Payer: Self-pay | Admitting: Student

## 2021-10-27 ENCOUNTER — Encounter (HOSPITAL_COMMUNITY): Payer: Medicare Other

## 2021-10-31 ENCOUNTER — Ambulatory Visit (HOSPITAL_COMMUNITY)
Admission: RE | Admit: 2021-10-31 | Discharge: 2021-10-31 | Disposition: A | Discharge status: Home / Self Care | Payer: Medicare Other | Source: Ambulatory Visit | Attending: Surgical Oncology | Admitting: Surgical Oncology

## 2021-10-31 DIAGNOSIS — E119 Type 2 diabetes mellitus without complications: Secondary | ICD-10-CM | POA: Diagnosis not present

## 2021-10-31 DIAGNOSIS — E118 Type 2 diabetes mellitus with unspecified complications: Secondary | ICD-10-CM | POA: Diagnosis not present

## 2021-10-31 DIAGNOSIS — K219 Gastro-esophageal reflux disease without esophagitis: Secondary | ICD-10-CM | POA: Insufficient documentation

## 2021-10-31 MED ORDER — TECHNETIUM TC 99M SULFUR COLLOID
2.0000 | Freq: Once | INTRAVENOUS | Status: AC | PRN
  Administered 2021-10-31: 2 via INTRAVENOUS

## 2021-11-01 DIAGNOSIS — E113212 Type 2 diabetes mellitus with mild nonproliferative diabetic retinopathy with macular edema, left eye: Secondary | ICD-10-CM | POA: Diagnosis not present

## 2021-11-02 ENCOUNTER — Inpatient Hospital Stay (HOSPITAL_COMMUNITY): Admission: RE | Admit: 2021-11-02 | Payer: Medicare Other | Source: Ambulatory Visit

## 2021-11-03 ENCOUNTER — Inpatient Hospital Stay (HOSPITAL_COMMUNITY): Admission: RE | Admit: 2021-11-03 | Payer: Medicare Other | Source: Ambulatory Visit

## 2021-11-08 ENCOUNTER — Encounter (HOSPITAL_COMMUNITY)
Admission: RE | Admit: 2021-11-08 | Discharge: 2021-11-08 | Disposition: A | Discharge status: Home / Self Care | Payer: Medicare Other | Source: Ambulatory Visit | Attending: Nephrology | Admitting: Nephrology

## 2021-11-08 DIAGNOSIS — N185 Chronic kidney disease, stage 5: Secondary | ICD-10-CM | POA: Diagnosis not present

## 2021-11-08 DIAGNOSIS — N183 Chronic kidney disease, stage 3 unspecified: Secondary | ICD-10-CM

## 2021-11-08 LAB — RENAL FUNCTION PANEL
Albumin: 3.6 g/dL (ref 3.5–5.0)
Anion gap: 12 (ref 5–15)
BUN: 76 mg/dL — ABNORMAL HIGH (ref 6–20)
CO2: 24 mmol/L (ref 22–32)
Calcium: 9.4 mg/dL (ref 8.9–10.3)
Chloride: 100 mmol/L (ref 98–111)
Creatinine, Ser: 3.88 mg/dL — ABNORMAL HIGH (ref 0.61–1.24)
GFR, Estimated: 19 mL/min — ABNORMAL LOW (ref >60)
Glucose, Bld: 131 mg/dL — ABNORMAL HIGH (ref 70–99)
Phosphorus: 4.1 mg/dL (ref 2.5–4.6)
Potassium: 3.4 mmol/L — ABNORMAL LOW (ref 3.5–5.1)
Sodium: 136 mmol/L (ref 135–145)

## 2021-11-08 LAB — IRON AND TIBC
Iron: 53 ug/dL (ref 45–182)
Saturation Ratios: 18 % (ref 17.9–39.5)
TIBC: 288 ug/dL (ref 250–450)
UIBC: 235 ug/dL

## 2021-11-08 LAB — POCT HEMOGLOBIN-HEMACUE: Hemoglobin: 11.7 g/dL — ABNORMAL LOW (ref 13.0–17.0)

## 2021-11-08 LAB — FERRITIN: Ferritin: 386 ng/mL — ABNORMAL HIGH (ref 24–336)

## 2021-11-08 MED ORDER — DARBEPOETIN ALFA 200 MCG/0.4ML IJ SOSY
PREFILLED_SYRINGE | INTRAMUSCULAR | Status: AC
  Filled 2021-11-08: qty 0.4

## 2021-11-08 MED ORDER — DARBEPOETIN ALFA 200 MCG/0.4ML IJ SOSY
200.0000 ug | PREFILLED_SYRINGE | INTRAMUSCULAR | Status: DC
  Administered 2021-11-08: 200 ug via SUBCUTANEOUS

## 2021-11-09 LAB — PTH, INTACT AND CALCIUM
Calcium, Total (PTH): 9.3 mg/dL (ref 8.7–10.2)
PTH: 202 pg/mL — ABNORMAL HIGH (ref 15–65)

## 2021-11-14 ENCOUNTER — Other Ambulatory Visit: Payer: Self-pay

## 2021-11-14 MED ORDER — NOVOLOG FLEXPEN 100 UNIT/ML ~~LOC~~ SOPN: 15.0000 [IU] | PEN_INJECTOR | Freq: Three times a day (TID) | SUBCUTANEOUS | 0 refills | Status: DC

## 2021-11-23 ENCOUNTER — Other Ambulatory Visit: Payer: Self-pay

## 2021-11-24 ENCOUNTER — Encounter (HOSPITAL_COMMUNITY): Payer: Medicare Other

## 2021-11-25 ENCOUNTER — Telehealth: Payer: Self-pay

## 2021-11-25 DIAGNOSIS — E669 Obesity, unspecified: Secondary | ICD-10-CM | POA: Diagnosis not present

## 2021-11-25 DIAGNOSIS — N2581 Secondary hyperparathyroidism of renal origin: Secondary | ICD-10-CM | POA: Diagnosis not present

## 2021-11-25 DIAGNOSIS — Z86718 Personal history of other venous thrombosis and embolism: Secondary | ICD-10-CM | POA: Diagnosis not present

## 2021-11-25 DIAGNOSIS — E1122 Type 2 diabetes mellitus with diabetic chronic kidney disease: Secondary | ICD-10-CM | POA: Diagnosis not present

## 2021-11-25 DIAGNOSIS — I129 Hypertensive chronic kidney disease with stage 1 through stage 4 chronic kidney disease, or unspecified chronic kidney disease: Secondary | ICD-10-CM | POA: Diagnosis not present

## 2021-11-25 DIAGNOSIS — D631 Anemia in chronic kidney disease: Secondary | ICD-10-CM | POA: Diagnosis not present

## 2021-11-25 DIAGNOSIS — E876 Hypokalemia: Secondary | ICD-10-CM | POA: Diagnosis not present

## 2021-11-25 DIAGNOSIS — Z794 Long term (current) use of insulin: Secondary | ICD-10-CM | POA: Diagnosis not present

## 2021-11-25 DIAGNOSIS — N184 Chronic kidney disease, stage 4 (severe): Secondary | ICD-10-CM | POA: Diagnosis not present

## 2021-11-25 NOTE — Telephone Encounter (Signed)
Pt reporting that his Wheelchair is broken in the back. (Pt has been trying to call [employer] with no response. Pt states he stays on hold for over 30 minutes every time he calls).  Pt is also requesting a traveling Shower chair if possible.

## 2021-11-29 ENCOUNTER — Ambulatory Visit (HOSPITAL_COMMUNITY)
Admission: RE | Admit: 2021-11-29 | Discharge: 2021-11-29 | Disposition: A | Discharge status: Home / Self Care | Payer: Medicare Other | Source: Ambulatory Visit | Attending: Nephrology | Admitting: Nephrology

## 2021-11-29 VITALS — BP 156/70 | HR 89 | Temp 97.4°F | Resp 17

## 2021-11-29 DIAGNOSIS — N183 Chronic kidney disease, stage 3 unspecified: Secondary | ICD-10-CM | POA: Insufficient documentation

## 2021-11-29 DIAGNOSIS — D631 Anemia in chronic kidney disease: Secondary | ICD-10-CM | POA: Diagnosis not present

## 2021-11-29 LAB — FERRITIN: Ferritin: 344 ng/mL — ABNORMAL HIGH (ref 24–336)

## 2021-11-29 LAB — RENAL FUNCTION PANEL
Albumin: 3.5 g/dL (ref 3.5–5.0)
Anion gap: 11 (ref 5–15)
BUN: 74 mg/dL — ABNORMAL HIGH (ref 6–20)
CO2: 22 mmol/L (ref 22–32)
Calcium: 9 mg/dL (ref 8.9–10.3)
Chloride: 106 mmol/L (ref 98–111)
Creatinine, Ser: 3.71 mg/dL — ABNORMAL HIGH (ref 0.61–1.24)
GFR, Estimated: 20 mL/min — ABNORMAL LOW (ref >60)
Glucose, Bld: 87 mg/dL (ref 70–99)
Phosphorus: 4.9 mg/dL — ABNORMAL HIGH (ref 2.5–4.6)
Potassium: 4.1 mmol/L (ref 3.5–5.1)
Sodium: 139 mmol/L (ref 135–145)

## 2021-11-29 LAB — IRON AND TIBC
Iron: 76 ug/dL (ref 45–182)
Saturation Ratios: 25 % (ref 17.9–39.5)
TIBC: 304 ug/dL (ref 250–450)
UIBC: 228 ug/dL

## 2021-11-29 LAB — POCT HEMOGLOBIN-HEMACUE: Hemoglobin: 11.1 g/dL — ABNORMAL LOW (ref 13.0–17.0)

## 2021-11-29 MED ORDER — DARBEPOETIN ALFA 200 MCG/0.4ML IJ SOSY
PREFILLED_SYRINGE | INTRAMUSCULAR | Status: AC
  Filled 2021-11-29: qty 0.4

## 2021-11-29 MED ORDER — DARBEPOETIN ALFA 200 MCG/0.4ML IJ SOSY
200.0000 ug | PREFILLED_SYRINGE | INTRAMUSCULAR | Status: DC
  Administered 2021-11-29: 200 ug via SUBCUTANEOUS

## 2021-11-30 ENCOUNTER — Other Ambulatory Visit: Payer: Self-pay

## 2021-11-30 LAB — PTH, INTACT AND CALCIUM
Calcium, Total (PTH): 8.9 mg/dL (ref 8.7–10.2)
PTH: 179 pg/mL — ABNORMAL HIGH (ref 15–65)

## 2021-12-06 ENCOUNTER — Other Ambulatory Visit: Payer: Self-pay

## 2021-12-06 DIAGNOSIS — E113212 Type 2 diabetes mellitus with mild nonproliferative diabetic retinopathy with macular edema, left eye: Secondary | ICD-10-CM | POA: Diagnosis not present

## 2021-12-07 ENCOUNTER — Other Ambulatory Visit: Payer: Self-pay | Admitting: Internal Medicine

## 2021-12-07 ENCOUNTER — Encounter (HOSPITAL_COMMUNITY): Payer: Self-pay

## 2021-12-07 ENCOUNTER — Encounter: Payer: Medicare Other | Admitting: Internal Medicine

## 2021-12-07 ENCOUNTER — Ambulatory Visit: Payer: Medicare Other

## 2021-12-07 NOTE — Progress Notes (Deleted)
CC: left elbow pain  HPI:  [name] is a 46 y.o. male living with a history stated below and presents today for an acute visit regarding left elbow pain. Please see problem based assessment and plan for additional details.  Past Medical History:  Diagnosis Date   Acute venous embolism and thrombosis of deep vessels of proximal lower extremity (HCC) 07/19/2011   Anemia    Anginal pain (HCC)    pt denies   Anxiety    Chest pain, neg MI, normal coronaries by cath 02/18/2013   pt denies   CHF (congestive heart failure) (HCC)    Chronic renal disease, stage IV ([place]) 02/19/2013   Colon polyps    Depression    Diabetic ulcer of right foot ([place])    DVT (deep venous thrombosis) ([place]) 09/2002   patient reports additional DVTs in '06 & '11 (unconfirmed)   ED (erectile dysfunction)    GERD (gastroesophageal reflux disease)    History of blood transfusion    "related to OR" (10/31/2016)   Hyperlipidemia 02/19/2013   Hypertension    Nausea & vomiting    "constant for the last couple weeks" (10/31/2016)   Nephrotic syndrome    Obesity    BMI 44, weight 346 pounds 01/30/14   OSA (obstructive sleep apnea) 02/28/2018   Mild obstructive sleep apnea with an AHI of 9.8/h but severe during REM sleep with an AHI of 33.8/h.  Oxygen saturations dropped to 86% and there was moderate snoring   Peripheral edema    Pneumonia    Prosthesis adjustment 08/17/2016   Pulmonary embolism ([place]) 09/2002   treated with 6 months of warfarin   Pyelonephritis 02/02/2018   Type I diabetes mellitus ([place]) dx'd 2001   Urinary retention     Current Outpatient Medications on File Prior to Visit  Medication Sig Dispense Refill   Accu-Chek Softclix Lancets lancets Check blood sugar up to 3 times a day (Patient not taking: Reported on 02/09/2021) 300 each 3   acidophilus (RISAQUAD) CAPS capsule Take 1 capsule by mouth daily.     Alpha-Lipoic Acid 600 MG CAPS Take 600 mg by mouth daily.     amLODipine  (NORVASC) 10 MG tablet Take 10 mg by mouth at bedtime.     Blood Glucose Monitoring Suppl (ACCU-CHEK GUIDE ME) w/Device KIT Use to check blood sugar up to 3 times a day (Patient not taking: Reported on 02/09/2021) 1 kit 1   carvedilol (COREG) 25 MG tablet TAKE 1 TABLET BY MOUTH TWICE DAILY WITH A MEAL 180 tablet 2   chlorthalidone (HYGROTON) 25 MG tablet Take 25 mg by mouth daily.     cloNIDine (CATAPRES - DOSED IN MG/24 HR) 0.1 mg/24hr patch 0.1 mg once a week.     Cyanocobalamin (B-12) 500 MCG SUBL Place 500 mcg under the tongue daily.      cyclobenzaprine (FLEXERIL) 5 MG tablet Take 1 tablet (5 mg total) by mouth at bedtime as needed for muscle spasms. 10 tablet 0   diclofenac Sodium (VOLTAREN) 1 % GEL Apply 2 g topically 4 (four) times daily. 100 g 3   ELIQUIS 5 MG TABS tablet Take 1 tablet by mouth twice daily 180 tablet 0   famotidine (PEPCID) 20 MG tablet Take 1 tablet (20 mg total) by mouth 2 (two) times daily. (Patient taking differently: Take 20 mg by mouth 2 (two) times daily as needed for heartburn.) 30 tablet 0   fluticasone (FLONASE) 50 MCG/ACT nasal spray Place 2  sprays into both nostrils daily. (Patient taking differently: Place 2 sprays into both nostrils daily as needed for allergies.) 15.8 mL 2   furosemide (LASIX) 80 MG tablet Take 1 tablet by mouth twice daily (Patient taking differently: Take 80 mg by mouth 2 (two) times daily.) 180 tablet 0   gabapentin (NEURONTIN) 100 MG capsule Take 1 capsule (100 mg total) by mouth 3 (three) times daily. 270 capsule 3   glucose blood (ACCU-CHEK GUIDE) test strip Check blood sugar up to 3 times a day (Patient not taking: Reported on 02/09/2021) 300 each 3   hydrALAZINE (APRESOLINE) 100 MG tablet Take 100 mg by mouth 3 (three) times daily.     Insulin Pen Needle (B-D UF III MINI PEN NEEDLES) 31G X 5 MM MISC 1 each by Other route See admin instructions. USE 4-5 TIMES DAILY 200 each 0   Insulin Pen Needle (B-D UF III MINI PEN NEEDLES) 31G X 5 MM  MISC USE 1  FIVE TIMES DAILY 400 each 0   Lancet Devices (ACCU-CHEK SOFTCLIX) lancets Use to check blood sugars 5 times a day. Dx code:E11.9. Insulin dependent. (Patient taking differently: 1 each by Other route See admin instructions. Use to check blood sugars 5 times a day. Dx code:E11.9. Insulin dependent.) 6 each 3   lovastatin (MEVACOR) 20 MG tablet Take 1 tablet by mouth once daily 90 tablet 1   NOVOLOG FLEXPEN 100 UNIT/ML FlexPen Inject 15-30 Units into the skin 3 (three) times daily with meals. Administer 15 units in the morning with breakfast, 25 units with lunch, and 30 units with dinner daily.  ICD Code:  E11.9 15 mL 0   oxybutynin (DITROPAN) 5 MG tablet TAKE 1 TABLET BY MOUTH THREE TIMES DAILY 90 tablet 0   polyethylene glycol (MIRALAX / GLYCOLAX) 17 g packet Take 17 g by mouth 2 (two) times daily. (Patient taking differently: Take 17 g by mouth as needed for mild constipation.) 14 each 0   potassium chloride SA (KLOR-CON) 20 MEQ tablet Take 20 mEq by mouth 2 (two) times daily.     senna-docusate (SENOKOT-S) 8.6-50 MG tablet Take 1 tablet by mouth daily. (Patient taking differently: Take 1 tablet by mouth as needed for mild constipation.) 90 tablet 3   sildenafil (VIAGRA) 50 MG tablet Take 1-2 tablets (50-100 mg total) by mouth as needed for erectile dysfunction. Take 1 hour before needed. Do not take more than 2 tablets at once. 20 tablet 0   tamsulosin (FLOMAX) 0.4 MG CAPS capsule Take 1 capsule (0.4 mg total) by mouth at bedtime. 90 capsule 3   tirzepatide (MOUNJARO) 7.5 MG/0.5ML Pen Inject 7.5 mg into the skin once a week. 2 mL 2   TRESIBA FLEXTOUCH 200 UNIT/ML FlexTouch Pen INJECT 96 UNITS SUBCUTANEOUSLY ONCE DAILY 45 mL 0   No current facility-administered medications on file prior to visit.    Family History  Problem Relation Age of Onset   Heart disease Mother    Diabetes Mother    Diabetes Maternal Uncle    Diabetes Cousin     Social History   Socioeconomic History    Marital status: Single    Spouse name: Not on file   Number of children: 0   Years of education: 15.5   Highest education level: Some college, no degree  Occupational History   Occupation: student    Comment: [employer]   Occupation: disability  Tobacco Use   Smoking status: Never   Smokeless tobacco: Never  Vaping Use  Vaping Use: Never used  Substance and Sexual Activity   Alcohol use: Yes    Comment: 10/31/2016 "a few beers q 6 months or so"   Drug use: No   Sexual activity: Yes  Other Topics Concern   Not on file  Social History Narrative   Financial assistance approved for 100% discount at [facility] and has [facility] card per Bonna Gains   09/01/2009.      Lives in [place] with mother and sister.            Social Determinants of Health   Financial Resource Strain: Medium Risk (10/01/2020)   Overall Financial Resource Strain (CARDIA)    Difficulty of Paying Living Expenses: Somewhat hard  Food Insecurity: No Food Insecurity (10/01/2020)   Hunger Vital Sign    Worried About Running Out of Food in the Last Year: Never true    Ran Out of Food in the Last Year: Never true  Transportation Needs: No Transportation Needs (10/01/2020)   PRAPARE - Hydrologist (Medical): No    Lack of Transportation (Non-Medical): No  Physical Activity: Sufficiently Active (08/20/2019)   Exercise Vital Sign    Days of Exercise per Week: 6 days    Minutes of Exercise per Session: 60 min  Stress: No Stress Concern Present (08/20/2019)   [place]    Feeling of Stress : Not at all  Social Connections: Socially Isolated (08/20/2019)   Social Connection and Isolation Panel [NHANES]    Frequency of Communication with Friends and Family: More than three times a week    Frequency of Social Gatherings with Friends and Family: More than three times a week    Attends Religious Services: Never    Marine scientist  or Organizations: No    Attends Archivist Meetings: Never    Marital Status: Never married  Human resources officer Violence: Not on file    Review of Systems: ROS negative except for what is noted on the assessment and plan.  There were no vitals filed for this visit.  Physical Exam: Constitutional: well-appearing *** sitting in ***, in no acute distress Cardiovascular: regular rate and rhythm, no m/r/g Pulmonary/Chest: normal work of breathing on room air, lungs clear to auscultation bilaterally Abdominal: soft, non-tender, non-distended MSK: normal bulk and tone Neurological: alert & oriented x 3, no focal deficit Skin: warm and dry Psych: normal mood and behavior  Assessment & Plan:     Patient {GC/GE:[id number]::"discussed with","seen with"} Dr. {TKPTW:[id number]::"NTZGYFVC","B. Hoffman","Mullen","Narendra","Vincent","Guilloud","Lau","Machen"}  No problem-specific Assessment & Plan notes found for this encounter.   Buddy Duty, D.O. [facility], PGY-2 Phone: [phone] Date 12/07/2021 Time 7:35 AM

## 2021-12-08 MED ORDER — OXYBUTYNIN CHLORIDE 5 MG PO TABS: 5.0000 mg | ORAL_TABLET | Freq: Three times a day (TID) | ORAL | 0 refills | Status: DC

## 2021-12-20 ENCOUNTER — Encounter (HOSPITAL_COMMUNITY)
Admission: RE | Admit: 2021-12-20 | Discharge: 2021-12-20 | Disposition: A | Discharge status: Home / Self Care | Payer: Medicare Other | Source: Ambulatory Visit | Attending: Nephrology | Admitting: Nephrology

## 2021-12-20 VITALS — BP 150/80 | HR 79 | Temp 97.7°F | Resp 17

## 2021-12-20 DIAGNOSIS — N183 Chronic kidney disease, stage 3 unspecified: Secondary | ICD-10-CM | POA: Diagnosis not present

## 2021-12-20 DIAGNOSIS — D631 Anemia in chronic kidney disease: Secondary | ICD-10-CM | POA: Insufficient documentation

## 2021-12-20 LAB — RENAL FUNCTION PANEL
Albumin: 3.5 g/dL (ref 3.5–5.0)
Anion gap: 11 (ref 5–15)
BUN: 77 mg/dL — ABNORMAL HIGH (ref 6–20)
CO2: 22 mmol/L (ref 22–32)
Calcium: 9 mg/dL (ref 8.9–10.3)
Chloride: 103 mmol/L (ref 98–111)
Creatinine, Ser: 3.43 mg/dL — ABNORMAL HIGH (ref 0.61–1.24)
GFR, Estimated: 22 mL/min — ABNORMAL LOW (ref >60)
Glucose, Bld: 150 mg/dL — ABNORMAL HIGH (ref 70–99)
Phosphorus: 3.9 mg/dL (ref 2.5–4.6)
Potassium: 3.9 mmol/L (ref 3.5–5.1)
Sodium: 136 mmol/L (ref 135–145)

## 2021-12-20 LAB — IRON AND TIBC
Iron: 82 ug/dL (ref 45–182)
Saturation Ratios: 27 % (ref 17.9–39.5)
TIBC: 300 ug/dL (ref 250–450)
UIBC: 218 ug/dL

## 2021-12-20 LAB — POCT HEMOGLOBIN-HEMACUE: Hemoglobin: 11 g/dL — ABNORMAL LOW (ref 13.0–17.0)

## 2021-12-20 LAB — FERRITIN: Ferritin: 358 ng/mL — ABNORMAL HIGH (ref 24–336)

## 2021-12-20 MED ORDER — DARBEPOETIN ALFA 200 MCG/0.4ML IJ SOSY
200.0000 ug | PREFILLED_SYRINGE | INTRAMUSCULAR | Status: DC
  Administered 2021-12-20: 200 ug via SUBCUTANEOUS

## 2021-12-20 MED ORDER — DARBEPOETIN ALFA 200 MCG/0.4ML IJ SOSY
PREFILLED_SYRINGE | INTRAMUSCULAR | Status: AC
  Filled 2021-12-20: qty 0.4

## 2021-12-21 LAB — PTH, INTACT AND CALCIUM
Calcium, Total (PTH): 9 mg/dL (ref 8.7–10.2)
PTH: 175 pg/mL — ABNORMAL HIGH (ref 15–65)

## 2021-12-22 ENCOUNTER — Telehealth: Payer: Self-pay

## 2021-12-22 ENCOUNTER — Other Ambulatory Visit: Payer: Self-pay

## 2021-12-22 ENCOUNTER — Ambulatory Visit (INDEPENDENT_AMBULATORY_CARE_PROVIDER_SITE_OTHER): Payer: Medicare Other

## 2021-12-22 VITALS — BP 119/65 | HR 81 | Temp 97.5°F | Ht 74.0 in | Wt 325.4 lb

## 2021-12-22 DIAGNOSIS — S78111A Complete traumatic amputation at level between right hip and knee, initial encounter: Secondary | ICD-10-CM

## 2021-12-22 DIAGNOSIS — M778 Other enthesopathies, not elsewhere classified: Secondary | ICD-10-CM | POA: Insufficient documentation

## 2021-12-22 DIAGNOSIS — Z89611 Acquired absence of right leg above knee: Secondary | ICD-10-CM | POA: Diagnosis not present

## 2021-12-22 MED ORDER — METHOCARBAMOL 500 MG PO TABS: 500.0000 mg | ORAL_TABLET | Freq: Three times a day (TID) | ORAL | 0 refills | Status: DC

## 2021-12-22 MED ORDER — LIDOCAINE 5 % EX PTCH: 1.0000 | MEDICATED_PATCH | CUTANEOUS | Status: DC

## 2021-12-22 MED ORDER — LIDOCAINE 4 % EX PTCH: 1.0000 | MEDICATED_PATCH | CUTANEOUS | 0 refills | Status: DC

## 2021-12-22 NOTE — Assessment & Plan Note (Signed)
Patient with history of right AKA due to osteomyelitis presents with need for wheelchair and traveling shower chair.  A/P: Referral for DME request.

## 2021-12-22 NOTE — Telephone Encounter (Signed)
Decision:Approved [name] Key: [id number] - PA Case ID: [id number] - Rx #: [id number] Need help? Call us at [phone] Outcome Approvedtoday Approved. This drug has been approved under the Member's Medicare Part D benefit for METHOCARBAMOL Tablet '500MG'$ . Approved quantity: 15 per 5 day(s). You may fill up to a 90 day supply except for those on Specialty Tier 5, which can be filled up to a 30 day supply. Please call the pharmacy to process the prescription claim. Drug Methocarbamol '500MG'$  tablets Form [facility] Medicare Electronic Prior Authorization Request Form [phone] NCPDP) [place] [phone]* Preferred products are: [id number] - TIZANIDINE TAB '2MG'$ , [id number] - CYCLOBENZAPR TAB '5MG'$  Health Plan's Preferred Products TIZANIDINE TAB '2MG'$  [id number] CYCLOBENZAPR TAB '5MG'$ 

## 2021-12-22 NOTE — Patient Instructions (Addendum)
[name], it was a pleasure seeing you today!  Today we discussed: Wheelchair, shower chair needs - referral placed for repair or replacement of wheelchair and traveling shower chair Left elbow pain - Use 1 lidocaine patch each day as needed. Use Robaxin as often as 3 times daily. Continue with topical gel and Tylenol as needed. Please do not use NSAIDs such as ibuprofen to protect your kidneys.  I have ordered the following labs today:  Lab Orders  No laboratory test(s) ordered today     Tests ordered today:  none  Referrals ordered today:   Referral Orders         Ambulatory Referral for DME      I have ordered the following medication/changed the following medications:   Stop the following medications: There are no discontinued medications.   Start the following medications: Meds ordered this encounter  Medications   methocarbamol (ROBAXIN) 500 MG tablet    Sig: Take 1 tablet (500 mg total) by mouth 3 (three) times daily.    Dispense:  15 tablet    Refill:  0   lidocaine (LIDODERM) 5 % 1 patch     Follow-up:  prn    Please make sure to arrive 15 minutes prior to your next appointment. If you arrive late, you may be asked to reschedule.   We look forward to seeing you next time. Please call our clinic at [phone] if you have any questions or concerns. The best time to call is Monday-Friday from 9am-4pm, but there is someone available 24/7. If after hours or the weekend, call the main hospital number and ask for the Internal Medicine Resident On-Call. If you need medication refills, please notify your pharmacy one week in advance and they will send [place] a request.  Thank you for letting us take part in your care. Wishing you the best!  Thank you, Linward Natal, MD

## 2021-12-22 NOTE — Progress Notes (Signed)
CC: dme request  HPI:  [name] is a 46 y.o. with past medical history as below who presents with DME requests.  Past Medical History:  Diagnosis Date   Acute venous embolism and thrombosis of deep vessels of proximal lower extremity (HCC) 07/19/2011   Anemia    Anginal pain (HCC)    pt denies   Anxiety    Chest pain, neg MI, normal coronaries by cath 02/18/2013   pt denies   CHF (congestive heart failure) (HCC)    Chronic renal disease, stage IV ([place]) 02/19/2013   Colon polyps    Depression    Diabetic ulcer of right foot ([place])    DVT (deep venous thrombosis) ([place]) 09/2002   patient reports additional DVTs in '06 & '11 (unconfirmed)   ED (erectile dysfunction)    GERD (gastroesophageal reflux disease)    History of blood transfusion    "related to OR" (10/31/2016)   Hyperlipidemia 02/19/2013   Hypertension    Nausea & vomiting    "constant for the last couple weeks" (10/31/2016)   Nephrotic syndrome    Obesity    BMI 44, weight 346 pounds 01/30/14   OSA (obstructive sleep apnea) 02/28/2018   Mild obstructive sleep apnea with an AHI of 9.8/h but severe during REM sleep with an AHI of 33.8/h.  Oxygen saturations dropped to 86% and there was moderate snoring   Peripheral edema    Pneumonia    Prosthesis adjustment 08/17/2016   Pulmonary embolism ([place]) 09/2002   treated with 6 months of warfarin   Pyelonephritis 02/02/2018   Type I diabetes mellitus ([place]) dx'd 2001   Urinary retention    Review of Systems:  See detailed assessment and plan for pertinent ROS.  Physical Exam:  Vitals:   12/22/21 0903  BP: 119/65  Pulse: 81  Temp: (!) 97.5 F (36.4 C)  TempSrc: Oral  SpO2: 95%  Weight: (!) 325 lb 6.4 oz (147.6 kg)  Height: '6\' 2"'$  (1.88 m)   Physical Exam Constitutional:      General: He is not in acute distress. HENT:     Head: Normocephalic and atraumatic.  Eyes:     Extraocular Movements: Extraocular movements intact.  Pulmonary:     Effort:  Pulmonary effort is normal.  Musculoskeletal:     Cervical back: Neck supple.     Comments: Right AKA w prosthetic. Left elbow with tenderness at distal triceps / tendon. No bony tenderness. Strength intact. Full ROM. No fluctuance or erythema. No warmth.  Skin:    General: Skin is warm and dry.  Neurological:     Mental Status: He is alert and oriented to person, place, and time.  Psychiatric:        Mood and Affect: Mood normal.        Behavior: Behavior normal.      Assessment & Plan:   See Encounters Tab for problem based charting.  Unilateral AKA, right [facility]) Assessment & Plan: Patient with history of right AKA due to osteomyelitis presents with need for wheelchair and traveling shower chair.  A/P: Referral for DME request.  Orders: -     Ambulatory Referral for DME  Triceps tendonitis Assessment & Plan: Patient presents with months of gradually worsening left elbow pain. Has tried low dose NSAIDs an, brace, and icy hot with only temporary relief. Does crossfit and feels this, along with transferring in and out of wheelchair, are exacerbating symptoms. Denies history of trauma / injury. Exam with tenderness  at left distal triceps near tendon insertion. No appreciable bursitis. Seems most consistent with tendonitis. Strength, ROM intact. -Lidocaine patch, Robaxin for 5 days. Advised not to use NSAIDs given kidney function. -If symptoms persist, could consider referral to sports med for steroid injection   Other orders -     Methocarbamol; Take 1 tablet (500 mg total) by mouth 3 (three) times daily.  Dispense: 15 tablet; Refill: 0 -     Lidocaine; Place 1 patch onto the skin daily.  Dispense: 5 patch; Refill: 0     Patient seen with Dr. Philipp Ovens

## 2021-12-22 NOTE — Telephone Encounter (Signed)
Prior Authorization for patient (Methocarbamol) came through on cover my meds was submitted with last office notes awaiting approval or denial

## 2021-12-22 NOTE — Assessment & Plan Note (Signed)
Patient presents with months of gradually worsening left elbow pain. Has tried low dose NSAIDs an, brace, and icy hot with only temporary relief. Does crossfit and feels this, along with transferring in and out of wheelchair, are exacerbating symptoms. Denies history of trauma / injury. Exam with tenderness at left distal triceps near tendon insertion. No appreciable bursitis. Seems most consistent with tendonitis. Strength, ROM intact. -Lidocaine patch, Robaxin for 5 days. Advised not to use NSAIDs given kidney function. -If symptoms persist, could consider referral to sports med for steroid injection

## 2021-12-29 ENCOUNTER — Ambulatory Visit: Payer: Medicare Other | Attending: Internal Medicine | Admitting: Physical Therapy

## 2021-12-29 ENCOUNTER — Encounter: Payer: Self-pay | Admitting: Physical Therapy

## 2021-12-29 ENCOUNTER — Other Ambulatory Visit: Payer: Self-pay

## 2021-12-29 DIAGNOSIS — R2689 Other abnormalities of gait and mobility: Secondary | ICD-10-CM | POA: Diagnosis not present

## 2021-12-29 DIAGNOSIS — R2681 Unsteadiness on feet: Secondary | ICD-10-CM | POA: Diagnosis not present

## 2021-12-29 DIAGNOSIS — M6281 Muscle weakness (generalized): Secondary | ICD-10-CM | POA: Insufficient documentation

## 2021-12-29 DIAGNOSIS — I129 Hypertensive chronic kidney disease with stage 1 through stage 4 chronic kidney disease, or unspecified chronic kidney disease: Secondary | ICD-10-CM | POA: Diagnosis not present

## 2021-12-29 DIAGNOSIS — E669 Obesity, unspecified: Secondary | ICD-10-CM | POA: Diagnosis not present

## 2021-12-29 DIAGNOSIS — S78111A Complete traumatic amputation at level between right hip and knee, initial encounter: Secondary | ICD-10-CM | POA: Diagnosis not present

## 2021-12-29 DIAGNOSIS — I5032 Chronic diastolic (congestive) heart failure: Secondary | ICD-10-CM | POA: Diagnosis not present

## 2021-12-29 DIAGNOSIS — E1122 Type 2 diabetes mellitus with diabetic chronic kidney disease: Secondary | ICD-10-CM | POA: Diagnosis not present

## 2021-12-29 DIAGNOSIS — E876 Hypokalemia: Secondary | ICD-10-CM | POA: Diagnosis not present

## 2021-12-29 DIAGNOSIS — Z86718 Personal history of other venous thrombosis and embolism: Secondary | ICD-10-CM | POA: Diagnosis not present

## 2021-12-29 DIAGNOSIS — Z794 Long term (current) use of insulin: Secondary | ICD-10-CM | POA: Diagnosis not present

## 2021-12-29 DIAGNOSIS — D631 Anemia in chronic kidney disease: Secondary | ICD-10-CM | POA: Diagnosis not present

## 2021-12-29 DIAGNOSIS — N184 Chronic kidney disease, stage 4 (severe): Secondary | ICD-10-CM | POA: Diagnosis not present

## 2021-12-29 DIAGNOSIS — N2581 Secondary hyperparathyroidism of renal origin: Secondary | ICD-10-CM | POA: Diagnosis not present

## 2021-12-29 NOTE — Therapy (Signed)
OUTPATIENT PHYSICAL THERAPY PROSTHETICS EVALUATION   Patient Name: [name] MRN: [id number] DOB:1975/08/04, 46 y.o., male Today's Date: 12/29/2021  PCP: Starlyn Skeans, MD REFERRING PROVIDER: Charise Killian, MD  END OF SESSION:  12/29/21 0934  PT Visits / Re-Eval  Visit Number 1  Number of Visits 17 (16+eval)  Date for PT Re-Evaluation 02/24/22  Authorization  Authorization Type MEDICARE PART A AND B  PT Time Calculation  PT Start Time 0930  PT Stop Time 1015  PT Time Calculation (min) 45 min  PT - End of Session  Activity Tolerance Patient tolerated treatment well  Behavior During Therapy WFL for tasks assessed/performed    Past Medical History:  Diagnosis Date   Acute venous embolism and thrombosis of deep vessels of proximal lower extremity ([place]) 07/19/2011   Anemia    Anginal pain (HCC)    pt denies   Anxiety    Chest pain, neg MI, normal coronaries by cath 02/18/2013   pt denies   CHF (congestive heart failure) (HCC)    Chronic renal disease, stage IV ([place]) 02/19/2013   Colon polyps    Depression    Diabetic ulcer of right foot ([place])    DVT (deep venous thrombosis) ([place]) 09/2002   patient reports additional DVTs in '06 & '11 (unconfirmed)   ED (erectile dysfunction)    GERD (gastroesophageal reflux disease)    History of blood transfusion    "related to OR" (10/31/2016)   Hyperlipidemia 02/19/2013   Hypertension    Nausea & vomiting    "constant for the last couple weeks" (10/31/2016)   Nephrotic syndrome    Obesity    BMI 44, weight 346 pounds 01/30/14   OSA (obstructive sleep apnea) 02/28/2018   Mild obstructive sleep apnea with an AHI of 9.8/h but severe during REM sleep with an AHI of 33.8/h.  Oxygen saturations dropped to 86% and there was moderate snoring   Peripheral edema    Pneumonia    Prosthesis adjustment 08/17/2016   Pulmonary embolism ([place]) 09/2002   treated with 6 months of warfarin   Pyelonephritis 02/02/2018   Type I diabetes  mellitus ([place]) dx'd 2001   Urinary retention    Past Surgical History:  Procedure Laterality Date   AMPUTATION Right 12/22/2013   Procedure: AMPUTATION BELOW KNEE;  Surgeon: [name], MD;  Location: [facility];  Service: Orthopedics;  Laterality: Right;   AMPUTATION Right 02/19/2014   Procedure: RIGHT ABOVE KNEE AMPUTATION ;  Surgeon: [name], MD;  Location: [facility];  Service: Orthopedics;  Laterality: Right;   blood clot removal  2016   CARDIAC CATHETERIZATION  02/18/2013   normal coronaries   ESOPHAGOGASTRODUODENOSCOPY N/A 11/02/2016   Procedure: ESOPHAGOGASTRODUODENOSCOPY (EGD);  Surgeon: [name], MD;  Location[facility] ENDOSCOPY;  Service: Endoscopy;  Laterality: N/A;   I & D EXTREMITY Right 12/14/2013   Procedure: IRRIGATION AND DEBRIDEMENT RIGHT FOOT;  Surgeon: [name], MD;  Location: [facility];  Service: Orthopedics;  Laterality: Right;   INCISION AND DRAINAGE ABSCESS  2007; 2015   "back"   INCISION AND DRAINAGE OF WOUND Right 12/22/2013   Procedure: I&D RIGHT BUTTOCK;  Surgeon: [name], MD;  Location: [facility];  Service: Orthopedics;  Laterality: Right;   LEFT HEART CATHETERIZATION WITH CORONARY ANGIOGRAM N/A 02/18/2013   Procedure: LEFT HEART CATHETERIZATION WITH CORONARY ANGIOGRAM;  Surgeon: [name], MD;  Location: [facility] CATH LAB;  Service: Cardiovascular;  Laterality: N/A;   Patient Active Problem List   Diagnosis  Date Noted   Triceps tendonitis 12/22/2021   Trapezius strain, left, initial encounter 02/02/2021   Left shoulder pain 12/30/2020   Acute pulmonary edema (HCC)    Benign paroxysmal positional vertigo 09/14/2020   Anemia associated with chronic renal failure    Hemoglobinuria 12/12/2018   OSA (obstructive sleep apnea) 02/28/2018   Chronic diastolic heart failure ([place]) 02/02/2018   Preventative health care 08/15/2017   Erectile dysfunction 04/22/2017   History of pulmonary embolus (PE) 06/09/2016   Depression 05/04/2016   Hypogonadism in male  05/04/2016   Dyslipidemia 08/12/2014   Unilateral AKA, right ([place]) 12/26/2013   Hyperlipidemia 02/19/2013   S/P cardiac cath, 02/18/13, normal coronaries 02/19/2013   Chronic renal insufficiency, stage 3 (moderate) ([place]) 02/19/2013   Hypertension 07/24/2011   Long term current use of anticoagulant therapy 07/19/2011   History of DVT (deep vein thrombosis) 07/28/2009   Morbid obesity ([place]) 10/26/2005   Insulin dependent type 2 diabetes mellitus, controlled ([place]) 01/09/2002    ONSET DATE: 09/15/2021 (referral date)  REFERRING DIAG: N82.956O (ICD-10-CM) - Unilateral AKA, right ([place])  THERAPY DIAG:  Other abnormalities of gait and mobility  Muscle weakness (generalized)  Unsteadiness on feet  Rationale for Evaluation and Treatment: Rehabilitation  SUBJECTIVE:   SUBJECTIVE STATEMENT: "I got a new knee."  He states he received a new prosthetic knee joint in October 2023.  He states it is a K2 knee. Pt accompanied by: self-came via transportation  PERTINENT HISTORY: Insulin dependent DM2, hyperlipidemia, right AKA, chronic diastolic heart failure, HTN, BPPV  PAIN:  Are you having pain? No  PRECAUTIONS: Fall  WEIGHT BEARING RESTRICTIONS: No  FALLS: Has patient fallen in last 6 months? No  LIVING ENVIRONMENT: Lives with: lives alone Lives in: House/apartment Home Access: Level entry Home layout: One level Stairs: No Has following equipment at home: Lobbyist, Environmental consultant - 2 wheeled, Wheelchair (manual), Electronics engineer, and Grab bars  PLOF: Independent with household mobility with device, Independent with community mobility with device, Independent with transfers, and Needs assistance with homemaking  PATIENT GOALS: "To learn how to walk with this (gestures to prosthetic) effectively, build my endurance and get out of this chair!"  OBJECTIVE:   DIAGNOSTIC FINDINGS: No recent relevant imaging.  COGNITION: Overall cognitive status: Within functional limits for  tasks assessed   SENSATION: WFL  POSTURE: rounded shoulders and forward head  LOWER EXTREMITY ROM:  Active ROM Right eval Left eval  Hip flexion Grossly WFL; hip flexion lacking 10-15% in sitting WFL  Hip extension    Hip abduction  "  Hip adduction    Hip internal rotation    Hip external rotation    Knee flexion  "  Knee extension  "  Ankle dorsiflexion  "  Ankle plantarflexion    Ankle inversion    Ankle eversion     (Blank rows = not tested)  LOWER EXTREMITY MMT:  MMT Right eval Left eval  Hip flexion 2+/5 3+/5  Hip extension    Hip abduction 3/5 4/5  Hip adduction    Hip internal rotation    Hip external rotation    Knee flexion  4/5  Knee extension  4/5  Ankle dorsiflexion  4/5  Ankle plantarflexion    Ankle inversion    Ankle eversion    (Blank rows = not tested)  BED MOBILITY:  Sit to supine Complete Independence Supine to sit Complete Independence  TRANSFERS: Sit to stand: Modified independence Stand to sit: Modified independence Relies on  BUE on walker to stand/sit  GAIT: Gait pattern: step through pattern, decreased stride length, decreased hip/knee flexion- Right, decreased hip/knee flexion- Left, and trunk flexedIn-toeing on LLE, internal torsion of RLE socket Distance walked: 20' Assistive device utilized: Environmental consultant - 2 wheeled Level of assistance: SBA Comments: Pt denies feeling pistoning in socket despite appearing to sink into socket during internal rotation of the prosthetic during swing phase.  Pt continues to use ambulatory pattern for kicking out the RLE from having locking knee joint prior, intermittently kicking prosthetic leg too far forward for proper leverage decreasing efficiency of gait.  FUNCTIONAL TESTS:  5 times sit to stand: 17.31 sec w/ BUE support on RW 2 minute walk test: TBD 10 meter walk test: TBD Berg Balance Scale: TBD  CURRENT PROSTHETIC WEAR ASSESSMENT: Patient is independent with: skin check, residual limb  care, care of non-amputated limb, prosthetic cleaning, ply sock cleaning, correct ply sock adjustment, proper wear schedule/adjustment, and proper weight-bearing schedule/adjustment Patient is dependent with:  N/A Donning prosthesis: Complete Independence Doffing prosthesis: Complete Independence Prosthetic wear tolerance: 4-8 hours/day, 7 days/week Prosthetic weight bearing tolerance: roughly 5 minutes Edema: pt reports stable limb edema Residual limb condition: Pt reports no open wounds, prefers to not doff liner. Prosthetic description: TF lanyard w/ [place] HD 4-bar mechanical knee (w/ manual lock) - pt states knee is hydraulic K code/activity level with prosthetic use: Level 2  PATIENT SURVEYS:  None completed due to time.  TODAY'S TREATMENT:                                                                                                                                         DATE: N/A  TREATMENT:  PATIENT EDUCATED ON FOLLOWING PROSTHETIC CARE: Other education  Comments Pt is independent with all aspects of care and wear schedule/tolerance.  PATIENT EDUCATION: Education details: PT POC, equipment in gym, assessments used and to be used, discussion of focus of sessions with incorporation of pt preferences, and goals to be set. Person educated: Patient Education method: Explanation Education comprehension: verbalized understanding  HOME EXERCISE PROGRAM: To be established.  ASSESSMENT:  CLINICAL IMPRESSION: Patient is a 46 y.o. male who was seen today for physical therapy evaluation and treatment for right transfemoral amputation with recently upgraded prosthetic knee.  Pt has a significant PMH of Insulin dependent DM2, hyperlipidemia, chronic diastolic heart failure, HTN, and BPPV.  Identified impairments include inefficiency of gait w/ some in-toeing on the left and internal torsion of the right socket, generalized LE weakness right worse than left, decreased ambulatory endurance,  and reliance of RW to stand.  Evaluation via the following assessment tools: 5xSTS indicate fall risk requiring 17.31 seconds w/ BUE support to complete.  2MWT, 10MWT, and BERG to be assessed next session to further determine deficits and fall risk as time allotted this session to familiarize patient with gym and equipment per request.  He would  benefit from skilled PT to address impairments as noted and progress towards long term goals.  OBJECTIVE IMPAIRMENTS: Abnormal gait, decreased activity tolerance, decreased balance, decreased endurance, decreased mobility, difficulty walking, decreased ROM, decreased strength, improper body mechanics, prosthetic dependency , and obesity.   ACTIVITY LIMITATIONS: carrying, lifting, bending, standing, squatting, stairs, transfers, and locomotion level  PARTICIPATION LIMITATIONS: meal prep, cleaning, laundry, and driving  PERSONAL FACTORS: Fitness, Time since onset of injury/illness/exacerbation, Transportation, and 1-2 comorbidities: insulin dependent DM2 and HTN  are also affecting patient's functional outcome.   REHAB POTENTIAL: Good  CLINICAL DECISION MAKING: Evolving/moderate complexity  EVALUATION COMPLEXITY: Moderate   GOALS: Goals reviewed with patient? Yes  SHORT TERM GOALS: Target date: 01/27/2022  Pt will be independent with strength and balance HEP to maintain progress. Baseline:  To be established. Goal status: INITIAL  2.  Pt will decrease 5xSTS to </=13 seconds w/ use of only one hand on walker for support in order to demonstrate decreased risk for falls and improved functional bilateral LE strength and power. Baseline: 17.31 sec w/ BUE on RW Goal status: INITIAL  3.  2MWT to be assessed w/ STG/LTG set as appropriate. Baseline: To be assessed Goal status: INITIAL  4.  10MWT to be assessed w/ STG/LTG set as appropriate. Baseline: To be assessed Goal status: INITIAL  5.  BERG to be assessed w/ STG/LTG set as  appropriate. Baseline: To be assessed Goal status: INITIAL  LONG TERM GOALS: Target date: 02/24/2022  Pt will be independent with finalized strengthening HEP for use in the gym at to maintain progress at home. Baseline: To be established. Goal status: INITIAL  2.  2MWT to be assessed w/ STG/LTG set as appropriate. Baseline: To be assessed Goal status: INITIAL  3.  10MWT to be assessed w/ STG/LTG set as appropriate. Baseline: To be assessed Goal status: INITIAL  4.  BERG to be assessed w/ STG/LTG set as appropriate. Baseline: To be assessed Goal status: INITIAL  PLAN:  PT FREQUENCY: 2x/week  PT DURATION: 8 weeks  PLANNED INTERVENTIONS: Therapeutic exercises, Therapeutic activity, Neuromuscular re-education, Balance training, Gait training, Patient/Family education, Self Care, Joint mobilization, Stair training, Vestibular training, Prosthetic training, DME instructions, Manual therapy, and Re-evaluation  PLAN FOR NEXT SESSION: 2MWT, 10MWT, BERG-set STG/LTGs, initiate HEP- hip strengthening (hip flexor stretch?), recumbent bike, pt wants to work towards elliptical-edu on safe and realistic goals as pt progresses, pt would like to work towards treadmill-overground tolerance and working on step up efficiency to access the treadmill would likely be best initially?, leg press-pt request, upright posture, standing balance, endurance   Bary Richard, PT, DPT 12/29/2021, 9:37 AM

## 2021-12-30 ENCOUNTER — Other Ambulatory Visit (INDEPENDENT_AMBULATORY_CARE_PROVIDER_SITE_OTHER): Payer: Medicare Other

## 2021-12-30 DIAGNOSIS — E1165 Type 2 diabetes mellitus with hyperglycemia: Secondary | ICD-10-CM

## 2021-12-30 DIAGNOSIS — Z794 Long term (current) use of insulin: Secondary | ICD-10-CM

## 2021-12-30 LAB — LIPID PANEL
Cholesterol: 119 mg/dL (ref 0–200)
HDL: 38.1 mg/dL — ABNORMAL LOW (ref >39.00)
LDL Cholesterol: 60 mg/dL (ref 0–99)
NonHDL: 80.53
Total CHOL/HDL Ratio: 3
Triglycerides: 104 mg/dL (ref 0.0–149.0)
VLDL: 20.8 mg/dL (ref 0.0–40.0)

## 2021-12-30 LAB — GLUCOSE, RANDOM: Glucose, Bld: 79 mg/dL (ref 70–99)

## 2021-12-30 LAB — HEMOGLOBIN A1C: Hgb A1c MFr Bld: 5.2 % (ref 4.6–6.5)

## 2022-01-03 ENCOUNTER — Other Ambulatory Visit: Payer: Medicare Other

## 2022-01-03 ENCOUNTER — Ambulatory Visit: Payer: Medicare Other | Admitting: Physical Therapy

## 2022-01-03 NOTE — Telephone Encounter (Signed)
Called patient about no show to today's appointment on 01/03/2022 at 11:00am. Patient reported that he had his appointment dates mixed up but reported that he will be at his Thursday appointment this week. Also verbalized understanding of 3-visit no show policy.   [name], PT, DPT

## 2022-01-04 ENCOUNTER — Other Ambulatory Visit: Payer: Self-pay | Admitting: Student in an Organized Health Care Education/Training Program

## 2022-01-05 ENCOUNTER — Encounter: Payer: Self-pay | Admitting: Physical Therapy

## 2022-01-05 ENCOUNTER — Ambulatory Visit: Payer: Medicare Other | Admitting: Physical Therapy

## 2022-01-05 ENCOUNTER — Encounter: Payer: Self-pay | Admitting: Endocrinology

## 2022-01-05 ENCOUNTER — Ambulatory Visit (INDEPENDENT_AMBULATORY_CARE_PROVIDER_SITE_OTHER): Payer: Medicare Other | Admitting: Endocrinology

## 2022-01-05 VITALS — BP 124/66 | HR 83 | Ht 74.0 in | Wt 320.8 lb

## 2022-01-05 VITALS — BP 140/74 | HR 79

## 2022-01-05 DIAGNOSIS — E1165 Type 2 diabetes mellitus with hyperglycemia: Secondary | ICD-10-CM

## 2022-01-05 DIAGNOSIS — R2681 Unsteadiness on feet: Secondary | ICD-10-CM

## 2022-01-05 DIAGNOSIS — M6281 Muscle weakness (generalized): Secondary | ICD-10-CM | POA: Diagnosis not present

## 2022-01-05 DIAGNOSIS — S78111A Complete traumatic amputation at level between right hip and knee, initial encounter: Secondary | ICD-10-CM | POA: Diagnosis not present

## 2022-01-05 DIAGNOSIS — R2689 Other abnormalities of gait and mobility: Secondary | ICD-10-CM | POA: Diagnosis not present

## 2022-01-05 DIAGNOSIS — Z794 Long term (current) use of insulin: Secondary | ICD-10-CM | POA: Diagnosis not present

## 2022-01-05 NOTE — Patient Instructions (Signed)
Have apple at start of exercise

## 2022-01-05 NOTE — Therapy (Signed)
OUTPATIENT PHYSICAL THERAPY PROSTHETICS TREATMENT   Patient Name: [name] MRN: [id number] DOB:02/11/1975, 46 y.o., male Today's Date: 01/05/2022  PCP: Starlyn Skeans, MD REFERRING PROVIDER: Charise Killian, MD  END OF SESSION:  PT End of Session - 01/05/22 1247     Visit Number 2    Number of Visits 17    Date for PT Re-Evaluation 02/24/22    Authorization Type MEDICARE PART A AND B    PT Start Time 1255    PT Stop Time 1343    PT Time Calculation (min) 48 min    Equipment Utilized During Treatment --   patient refused gait belt despite therapist education/encouragement; safety measures included wheel chair follow and use of parallel bars   Activity Tolerance Patient tolerated treatment well    Behavior During Therapy WFL for tasks assessed/performed              Past Medical History:  Diagnosis Date   Acute venous embolism and thrombosis of deep vessels of proximal lower extremity ([place]) 07/19/2011   Anemia    Anginal pain ([place])    pt denies   Anxiety    Chest pain, neg MI, normal coronaries by cath 02/18/2013   pt denies   CHF (congestive heart failure) ([place])    Chronic renal disease, stage IV ([place]) 02/19/2013   Colon polyps    Depression    Diabetic ulcer of right foot ([place])    DVT (deep venous thrombosis) ([place]) 09/2002   patient reports additional DVTs in '06 & '11 (unconfirmed)   ED (erectile dysfunction)    GERD (gastroesophageal reflux disease)    History of blood transfusion    "related to OR" (10/31/2016)   Hyperlipidemia 02/19/2013   Hypertension    Nausea & vomiting    "constant for the last couple weeks" (10/31/2016)   Nephrotic syndrome    Obesity    BMI 44, weight 346 pounds 01/30/14   OSA (obstructive sleep apnea) 02/28/2018   Mild obstructive sleep apnea with an AHI of 9.8/h but severe during REM sleep with an AHI of 33.8/h.  Oxygen saturations dropped to 86% and there was moderate snoring   Peripheral edema    Pneumonia    Prosthesis  adjustment 08/17/2016   Pulmonary embolism ([place]) 09/2002   treated with 6 months of warfarin   Pyelonephritis 02/02/2018   Type I diabetes mellitus ([place]) dx'd 2001   Urinary retention    Past Surgical History:  Procedure Laterality Date   AMPUTATION Right 12/22/2013   Procedure: AMPUTATION BELOW KNEE;  Surgeon: [name], MD;  Location: [facility];  Service: Orthopedics;  Laterality: Right;   AMPUTATION Right 02/19/2014   Procedure: RIGHT ABOVE KNEE AMPUTATION ;  Surgeon: [name], MD;  Location: [facility];  Service: Orthopedics;  Laterality: Right;   blood clot removal  2016   CARDIAC CATHETERIZATION  02/18/2013   normal coronaries   ESOPHAGOGASTRODUODENOSCOPY N/A 11/02/2016   Procedure: ESOPHAGOGASTRODUODENOSCOPY (EGD);  Surgeon: [name], MD;  Location: [facility];  Service: Endoscopy;  Laterality: N/A;   I & D EXTREMITY Right 12/14/2013   Procedure: IRRIGATION AND DEBRIDEMENT RIGHT FOOT;  Surgeon: [name], MD;  Location: [facility];  Service: Orthopedics;  Laterality: Right;   INCISION AND DRAINAGE ABSCESS  2007; 2015   "back"   INCISION AND DRAINAGE OF WOUND Right 12/22/2013   Procedure: I&D RIGHT BUTTOCK;  Surgeon: [name], MD;  Location: [facility];  Service: Orthopedics;  Laterality: Right;  LEFT HEART CATHETERIZATION WITH CORONARY ANGIOGRAM N/A 02/18/2013   Procedure: LEFT HEART CATHETERIZATION WITH CORONARY ANGIOGRAM;  Surgeon: [name], MD;  Location: [facility];  Service: Cardiovascular;  Laterality: N/A;   Patient Active Problem List   Diagnosis Date Noted   Triceps tendonitis 12/22/2021   Trapezius strain, left, initial encounter 02/02/2021   Left shoulder pain 12/30/2020   Acute pulmonary edema ([facility])    Benign paroxysmal positional vertigo 09/14/2020   Anemia associated with chronic renal failure    Hemoglobinuria 12/12/2018   OSA (obstructive sleep apnea) 02/28/2018   Chronic diastolic heart failure ([place]) 02/02/2018   Preventative health care  08/15/2017   Erectile dysfunction 04/22/2017   History of pulmonary embolus (PE) 06/09/2016   Depression 05/04/2016   Hypogonadism in male 05/04/2016   Dyslipidemia 08/12/2014   Unilateral AKA, right ([place]) 12/26/2013   Hyperlipidemia 02/19/2013   S/P cardiac cath, 02/18/13, normal coronaries 02/19/2013   Chronic renal insufficiency, stage 3 (moderate) ([place]) 02/19/2013   Hypertension 07/24/2011   Long term current use of anticoagulant therapy 07/19/2011   History of DVT (deep vein thrombosis) 07/28/2009   Morbid obesity ([place]) 10/26/2005   Insulin dependent type 2 diabetes mellitus, controlled ([place]) 01/09/2002    ONSET DATE: 09/15/2021 (referral date)  REFERRING DIAG: X90.240X (ICD-10-CM) - Unilateral AKA, right ([place])  THERAPY DIAG:  Other abnormalities of gait and mobility  Muscle weakness (generalized)  Unsteadiness on feet  Rationale for Evaluation and Treatment: Rehabilitation  SUBJECTIVE:   SUBJECTIVE STATEMENT: Patient reports he is doing well. He had one change to medication which was updated in chart. Denies falls since last visit and reports no pain. Patient says he is not walking in his kitchen because it is a small space but is able to walk around in living room/dining room area a few minutes approximately 4x day. Patient requests if therapist can come to gym for a few session in future but therapist states that it is not possible from insurance/liability standpoint. No updates on chair; patient states he will call about referral. Patient also reports that he would like to do the treadmill in a future session, and he may bring his trainer in as well. Patient politely refused to wear gait belt during session despite therapist education on rational behind device and repeated recommendation.  Pt accompanied by: self-came via transportation  PERTINENT HISTORY: Insulin dependent DM2, hyperlipidemia, right AKA, chronic diastolic heart failure, HTN, BPPV  PAIN:  Are you  having pain? No  PRECAUTIONS: Fall  WEIGHT BEARING RESTRICTIONS: No  FALLS: Has patient fallen in last 6 months? No  LIVING ENVIRONMENT: Lives with: lives alone Lives in: House/apartment Home Access: Level entry Home layout: One level Stairs: No Has following equipment at home: Lobbyist, Environmental consultant - 2 wheeled, Wheelchair (manual), Electronics engineer, and Grab bars  PLOF: Independent with household mobility with device, Independent with community mobility with device, Independent with transfers, and Needs assistance with homemaking  PATIENT GOALS: "To learn how to walk with this (gestures to prosthetic) effectively, build my endurance and get out of this chair!"  OBJECTIVE:  Vitals:   01/05/22 1307  BP: (!) 140/74  Pulse: 79    DIAGNOSTIC FINDINGS: No recent relevant imaging.  FUNCTIONAL TESTS:  5 times sit to stand: 17.31 sec w/ BUE support on RW 2 minute walk test: 94.5 feet 2/10 RPE at start 4/10 RPE at end with use of FWW + SBA and wheel chair follow, x3 standing rest breaks <10" 10  meter walk test: 38.22" or 0.26 m/s with FWW + SBA Berg Balance Scale: Not tested due to patients's current functional balance  CURRENT PROSTHETIC WEAR ASSESSMENT: Patient is independent with: skin check, residual limb care, care of non-amputated limb, prosthetic cleaning, ply sock cleaning, correct ply sock adjustment, proper wear schedule/adjustment, and proper weight-bearing schedule/adjustment Patient is dependent with:  N/A Donning prosthesis: Complete Independence Doffing prosthesis: Complete Independence Prosthetic wear tolerance: 4-8 hours/day, 7 days/week Prosthetic weight bearing tolerance: roughly 5 minutes Edema: pt reports stable limb edema Residual limb condition: Pt reports no open wounds, prefers to not doff liner. Prosthetic description: TF lanyard w/ [place] HD 4-bar mechanical knee (w/ manual lock) - pt states knee is hydraulic K code/activity level with prosthetic use:  Level 2   TODAY'S TREATMENT:                                                                                                                                          NMR: 2 minute walk test: 94.5 feet or 28.54m 2/10 RPE at start 4/10 RPE at end with use of FWW + SBA and wheel chair follow, x3 standing rest breaks <10"  10 meter walk test: 38.22" or 0.26 m/s with FWW + SBA  Berg Balance Scale: attempted 2 minute stand and completed x3 trials with wheelchair behind between parallel bars with SBA; other components not assessed due to patient fatigue  Trial 1: 52.3"  Trial 2: 16"  Trail 3: 5" *Patient demonstrates reduced weight acceptance of RLE throughout all three sessions but improved with therapist cues  Self Care Home Management: Patient education on use of wheel chair tippers to improve safety with HEP. Recommended use of prosthetic during sit to stands in gym/home to improve weight acceptance/strength of RLE; reinforced safety measures with gait within home  PATIENT EDUCATION: Education details: wheel chair safety, equipment in gym, assessments used and to be used, reason behind safety measures used during session Person educated: Patient Education method: Explanation Education comprehension: verbalized understanding  HOME EXERCISE PROGRAM: Use of FWW + prosthetic leg with sit to stands during gym sessions in mornings; will update with additional exercises next session  ASSESSMENT:  CLINICAL IMPRESSION: Session emphasized continued assessment of patient's current functional status with gait, endurance, and balance. Patient ambulation speed of 0.233m indicates functional level of household walker and places patient at increased risk for falls. Patient ambulating below age-reported norms of ~183 m during 2MWT, indicating decreased cardiorespiratory endurance. Patient demonstrates strong motivation during session despite fatiguing quickly and will benefit form continued endurance,  strength, and gait training. Therapist trialed [place] discontinued full assessment due to patient fatigue and instability noted during wide base standing trials. Patient provided initial HEP and will benefit from updated form next session.   OBJECTIVE IMPAIRMENTS: Abnormal gait, decreased activity tolerance, decreased balance, decreased endurance, decreased mobility, difficulty walking, decreased ROM, decreased strength, improper body  mechanics, prosthetic dependency , and obesity.   ACTIVITY LIMITATIONS: carrying, lifting, bending, standing, squatting, stairs, transfers, and locomotion level  PARTICIPATION LIMITATIONS: meal prep, cleaning, laundry, and driving  PERSONAL FACTORS: Fitness, Time since onset of injury/illness/exacerbation, Transportation, and 1-2 comorbidities: insulin dependent DM2 and HTN  are also affecting patient's functional outcome.   REHAB POTENTIAL: Good  CLINICAL DECISION MAKING: Evolving/moderate complexity  EVALUATION COMPLEXITY: Moderate   GOALS: Goals reviewed with patient? Yes  SHORT TERM GOALS: Target date: 01/27/2022  Pt will be independent with strength and balance HEP to maintain progress. Baseline:  To be established. Goal status: INITIAL  2.  Pt will decrease 5xSTS to </=13 seconds w/ use of only one hand on walker for support in order to demonstrate decreased risk for falls and improved functional bilateral LE strength and power. Baseline: 17.31 sec w/ BUE on RW Goal status: INITIAL  3. Patient will improve 2MWT score to 45 meters to demonstrate improvement in aerobic capacity and endurance needed for ambulating in the community.   Baseline: 28.8 m with SBA with RW and wheel chair follow Goal status: INITIAL  4.  Patient will improve gait speed to 0.4 m/s or greater with LRAD to indicate an improvement from being a household ambulator to limited community ambulator.   Baseline: 0.26 m/s with RW + SBA Goal status: INITIAL  5.  Patient will  demonstrate ability to stand unsupported with wide base gait for 2 minutes without LOB to improve static stability needed for safety within home setting. Baseline: Discontinued Berg as not appropriate for patient at this time. Revised goal to standing balance/component Berg goal; initial on 01/05/2022 5-52" trials with feet approximately 1.5 feet apart Goal status: REVISED  LONG TERM GOALS: Target date: 02/24/2022  Pt will be independent with finalized strengthening HEP for use in the gym at to maintain progress at home. Baseline: To be established. Goal status: INITIAL  2.   Patient will improve 2MWT score to 80 meters to demonstrate improvement in aerobic capacity and endurance needed for ambulating in the community.   Baseline: 28.8 m with SBA with RW and wheel chair follow Goal status: INITIAL  3. Patient will improve gait speed to 0.8 m/s to indicate improvement to the level of community ambulator in order to participate more easily in activities outside of the home.   Baseline:  0.26 m/s with RW + SBA Goal status: INITIAL  4.  Patient will demonstrate ability to stand unsupported with feet less than 8 cm apart for 2 minutes without LOB to improve static stability needed for safety within home setting. Baseline: Discontinued Berg as not appropriate for patient at this time. Revised goal to standing balance/component Berg goal; initial on 01/05/2022 5-52" trials with feet approximately 1.5 feet apart Goal status: REVISED  PLAN:  PT FREQUENCY: 2x/week  PT DURATION: 8 weeks  PLANNED INTERVENTIONS: Therapeutic exercises, Therapeutic activity, Neuromuscular re-education, Balance training, Gait training, Patient/Family education, Self Care, Joint mobilization, Stair training, Vestibular training, Prosthetic training, DME instructions, Manual therapy, and Re-evaluation  PLAN FOR NEXT SESSION initiate HEP- hip strengthening (hip flexor stretch?), recumbent bike, pt wants to work towards  elliptical-edu on safe and realistic goals as pt progresses, pt would like to work towards treadmill-overground tolerance and working on step up efficiency to access the treadmill would likely be best initially?, leg press-pt request, upright posture, standing balance, endurance   Malachi Carl, PT, DPT  01/05/2022, 2:43 PM

## 2022-01-05 NOTE — Progress Notes (Signed)
Patient ID: [name], male   DOB: [birth year], 46 y.o.   MRN: [id number]          Reason for Appointment: Follow-up for Type 2 Diabetes    History of Present Illness:          Date of diagnosis of type 2 diabetes mellitus:  2001      His weight was about 400 pounds at the time of diagnosis He does not know what his initial blood sugars and circumstances were but he apparently was started on insulin initially He was not able to tolerate metformin because of diarrhea Subsequently has been on insulin only, level of control not available but his A1c was significantly high at 12.8 in 2015  Background history:    Recent history:   INSULIN regimen is:   48 units Tresiba once daily   NovoLog 0 units breakfast 10-15  units before lunch and 15-20 units at dinner   Non-insulin hypoglycemic drugs the patient is taking are:  Mounjaro 7.5 mg weekly   Current management, blood sugar patterns and problems identified:   His A1c is 5.2, about the same Fructosamine last 294,  Management, insulin regimen and problems:  He says he has better satiety with Mounjaro 7.5 mg and has no nausea However he still feels that he can do better with his diet with higher doses  Overall blood sugars may be more variable now because of eating out especially with celebrations He was told to take his snack like an apple at the start of exercise and he is usually trying to do this when his blood sugars are on the way down after exercise Has been able to avoid hypoglycemia more often Also still trying to adjust his mealtime dose based on his sugars and what he is planning to eat  As discussed in the CGM analysis before returns to have higher readings in the evenings compared to mornings probably because of his exercise regimen in the mornings Hypoglycemia been minimal   Side effects from medications have been: Diarrhea with metformin, vomiting with Ozempic  Glucose monitoring:  with freestyle libre version  2 with data as follows   Interpretation of his blood sugar patterns from his freestyle libre 2 through 8/24 are as follows: His time in range is slightly lower at 79%, was 95 This may be partly related to variability in blood sugars at all times especially in the morning hours  OVERNIGHT blood sugars tend to be the highest after about 8 PM and lowest around 2 AM  POSTPRANDIAL readings on an average are not higher compared to Premeal readings Higher readings may be more frequent on the weekends and at times after lunch or dinner  Premeal readings are appearing to be higher in the late afternoon compared to morning  Hypoglycemia has been minimal with only occasional transients around 9 AM or early afternoon, transient  Blood sugars postexercise in the mornings are getting lower around 7-8 AM    CGM use % of time 77  2-week average/GV 144/27  Time in range      79%  % Time Above 180 19+1  % Time above 250   % Time Below 70 1     PRE-MEAL Fasting Lunch Dinner Bedtime Overall  Glucose range:       Averages: 127    144   POST-MEAL PC Breakfast PC Lunch PC Dinner  Glucose range:     Averages: 132 141 162   Previously:  CGM use %  of time   2-week average/GV 123/23  Time in range    95    %  % Time Above 180 4  % Time above 250   % Time Below 70 1     PRE-MEAL Fasting Lunch Dinner Bedtime Overall  Glucose range:       Averages: 132   124 123   POST-MEAL PC Breakfast PC Lunch PC Dinner  Glucose range:     Averages: 112 132 121    Self-care:    Typical meal intake: Breakfast is egg whites, some oatmeal/grits and banana.  Usually small portions of meat or other protein at lunch and dinner and only a small amount of sweet potato at lunch or carbohydrate              Dietician visit, most recent: Unknown               Weight history:  Wt Readings from Last 3 Encounters:  01/05/22 (!) 320 lb 12.8 oz (145.5 kg)  12/22/21 (!) 325 lb 6.4 oz (147.6 kg)  09/15/21 (!) 352 lb  14.4 oz (160.1 kg)    Glycemic control:   Lab Results  Component Value Date   HGBA1C 5.2 12/30/2021   HGBA1C 5.4 08/23/2021   HGBA1C 5.5 05/16/2021   Lab Results  Component Value Date   MICROALBUR 160.61 (H) 08/14/2013   LDLCALC 60 12/30/2021   CREATININE 3.43 (H) 12/20/2021   Lab Results  Component Value Date   MICRALBCREAT 1,528.4 (H) 02/15/2017    Lab Results  Component Value Date   FRUCTOSAMINE 294 (H) 08/23/2021   FRUCTOSAMINE 303 (H) 05/16/2021   FRUCTOSAMINE 326 (H) 10/26/2020   Lab Results  Component Value Date   HGB 11.0 (L) 12/20/2021      Allergies as of 01/05/2022       Reactions   Reglan [metoclopramide] Other (See Comments)   Dysphoric reaction   Metformin And Related Diarrhea   Ozempic (0.25 Or 0.5 Mg-dose) [semaglutide(0.25 Or 0.62m-dos)] Nausea And Vomiting        Medication List        Accurate as of January 05, 2022 12:21 PM. If you have any questions, ask your nurse or doctor.          Accu-Chek Guide Me w/Device Kit Use to check blood sugar up to 3 times a day   Accu-Chek Guide test strip Generic drug: glucose blood Check blood sugar up to 3 times a day   accu-chek softclix lancets Use to check blood sugars 5 times a day. Dx code:E11.9. Insulin dependent. What changed:  how much to take how to take this when to take this   Accu-Chek Softclix Lancets lancets Check blood sugar up to 3 times a day   acidophilus Caps capsule Take 1 capsule by mouth daily.   Alpha-Lipoic Acid 600 MG Caps Take 600 mg by mouth daily.   amLODipine 10 MG tablet Commonly known as: NORVASC Take 10 mg by mouth at bedtime.   B-12 500 MCG Subl Place 500 mcg under the tongue daily.   B-D UF III MINI PEN NEEDLES 31G X 5 MM Misc Generic drug: Insulin Pen Needle 1 each by Other route See admin instructions. USE 4-5 TIMES DAILY   B-D UF III MINI PEN NEEDLES 31G X 5 MM Misc Generic drug: Insulin Pen Needle USE 1  FIVE TIMES DAILY    carvedilol 25 MG tablet Commonly known as: COREG TAKE 1 TABLET BY MOUTH TWICE DAILY  WITH A MEAL   chlorthalidone 25 MG tablet Commonly known as: HYGROTON Take 25 mg by mouth daily.   cloNIDine 0.1 mg/24hr patch Commonly known as: CATAPRES - Dosed in mg/24 hr 0.1 mg once a week.   cyclobenzaprine 5 MG tablet Commonly known as: FLEXERIL Take 1 tablet (5 mg total) by mouth at bedtime as needed for muscle spasms.   diclofenac Sodium 1 % Gel Commonly known as: Voltaren Apply 2 g topically 4 (four) times daily.   Eliquis 5 MG Tabs tablet Generic drug: apixaban Take 1 tablet by mouth twice daily   famotidine 20 MG tablet Commonly known as: PEPCID Take 1 tablet (20 mg total) by mouth 2 (two) times daily. What changed:  when to take this reasons to take this   fluticasone 50 MCG/ACT nasal spray Commonly known as: Flonase Place 2 sprays into both nostrils daily. What changed:  when to take this reasons to take this   furosemide 80 MG tablet Commonly known as: LASIX Take 1 tablet by mouth twice daily What changed: when to take this   gabapentin 100 MG capsule Commonly known as: NEURONTIN Take 1 capsule (100 mg total) by mouth 3 (three) times daily.   hydrALAZINE 100 MG tablet Commonly known as: APRESOLINE Take 100 mg by mouth 3 (three) times daily.   lidocaine 4 % Commonly known as: HM Lidocaine Patch Place 1 patch onto the skin daily.   lovastatin 20 MG tablet Commonly known as: MEVACOR Take 1 tablet by mouth once daily   methocarbamol 500 MG tablet Commonly known as: ROBAXIN Take 1 tablet (500 mg total) by mouth 3 (three) times daily.   NovoLOG FlexPen 100 UNIT/ML FlexPen Generic drug: insulin aspart Inject 15-30 Units into the skin 3 (three) times daily with meals. Administer 15 units in the morning with breakfast, 25 units with lunch, and 30 units with dinner daily.  ICD Code:  E11.9   oxybutynin 5 MG tablet Commonly known as: DITROPAN TAKE 1 TABLET BY  MOUTH THREE TIMES DAILY   polyethylene glycol 17 g packet Commonly known as: MIRALAX / GLYCOLAX Take 17 g by mouth 2 (two) times daily. What changed:  when to take this reasons to take this   potassium chloride SA 20 MEQ tablet Commonly known as: KLOR-CON M Take 20 mEq by mouth 2 (two) times daily.   senna-docusate 8.6-50 MG tablet Commonly known as: Senokot-S Take 1 tablet by mouth daily. What changed:  when to take this reasons to take this   sildenafil 50 MG tablet Commonly known as: VIAGRA Take 1-2 tablets (50-100 mg total) by mouth as needed for erectile dysfunction. Take 1 hour before needed. Do not take more than 2 tablets at once.   tamsulosin 0.4 MG Caps capsule Commonly known as: FLOMAX Take 1 capsule (0.4 mg total) by mouth at bedtime.   tirzepatide 7.5 MG/0.5ML Pen Commonly known as: MOUNJARO Inject 7.5 mg into the skin once a week.   Tyler Aas FlexTouch 200 UNIT/ML FlexTouch Pen Generic drug: insulin degludec INJECT 96 UNITS SUBCUTANEOUSLY ONCE DAILY What changed: See the new instructions.        Allergies:  Allergies  Allergen Reactions   Reglan [Metoclopramide] Other (See Comments)    Dysphoric reaction   Metformin And Related Diarrhea   Ozempic (0.25 Or 0.5 Mg-Dose) [Semaglutide(0.25 Or 0.20m-Dos)] Nausea And Vomiting    Past Medical History:  Diagnosis Date   Acute [place] and [place] of deep vessels of proximal [place] 07[place]  Anemia    Anginal pain (HCC)    pt denies   Anxiety    Chest pain, neg MI, normal coronaries by cath 02/18/2013   pt denies   CHF (congestive heart failure) (HCC)    Chronic renal disease, stage IV ([place]) 02/19/2013   Colon polyps    Depression    Diabetic ulcer of right foot ([place])    DVT (deep venous thrombosis) ([place]) 09/2002   patient reports additional DVTs in '06 & '11 (unconfirmed)   ED (erectile dysfunction)    GERD (gastroesophageal reflux disease)    History of blood  transfusion    "related to OR" (10/31/2016)   Hyperlipidemia 02/19/2013   Hypertension    Nausea & vomiting    "constant for the last couple weeks" (10/31/2016)   Nephrotic syndrome    Obesity    BMI 44, weight 346 pounds 01/30/14   OSA (obstructive sleep apnea) 02/28/2018   Mild obstructive sleep apnea with an AHI of 9.8/h but severe during REM sleep with an AHI of 33.8/h.  Oxygen saturations dropped to 86% and there was moderate snoring   Peripheral edema    Pneumonia    Prosthesis adjustment 08/17/2016   Pulmonary embolism ([place]) 09/2002   treated with 6 months of warfarin   Pyelonephritis 02/02/2018   Type I diabetes mellitus ([place]) dx'd 2001   Urinary retention     Past Surgical History:  Procedure Laterality Date   AMPUTATION Right 12/22/2013   Procedure: AMPUTATION BELOW KNEE;  Surgeon: [name], MD;  Location: [place];  Service: Orthopedics;  Laterality: Right;   AMPUTATION Right 02/19/2014   Procedure: RIGHT ABOVE KNEE AMPUTATION ;  Surgeon: [name], MD;  Location: [place];  Service: Orthopedics;  Laterality: Right;   blood clot removal  2016   CARDIAC CATHETERIZATION  02/18/2013   normal coronaries   ESOPHAGOGASTRODUODENOSCOPY N/A 11/02/2016   Procedure: ESOPHAGOGASTRODUODENOSCOPY (EGD);  Surgeon: [name], MD;  Location: [facility] ENDOSCOPY;  Service: Endoscopy;  Laterality: N/A;   I & D EXTREMITY Right 12/14/2013   Procedure: IRRIGATION AND DEBRIDEMENT RIGHT FOOT;  Surgeon: [name], MD;  Location: [place];  Service: Orthopedics;  Laterality: Right;   INCISION AND DRAINAGE ABSCESS  2007; 2015   "back"   INCISION AND DRAINAGE OF WOUND Right 12/22/2013   Procedure: I&D RIGHT BUTTOCK;  Surgeon: [name], MD;  Location: [place];  Service: Orthopedics;  Laterality: Right;   LEFT HEART CATHETERIZATION WITH CORONARY ANGIOGRAM N/A 02/18/2013   Procedure: LEFT HEART CATHETERIZATION WITH CORONARY ANGIOGRAM;  Surgeon: [name], MD;  Location[facility] CATH LAB;  Service:  Cardiovascular;  Laterality: N/A;    Family History  Problem Relation Age of Onset   Heart disease Mother    Diabetes Mother    Diabetes Maternal Uncle    Diabetes Cousin     Social History:  reports that he has never smoked. He has never used smokeless tobacco. He reports current alcohol use. He reports that he does not use drugs.   Review of Systems   Lipid history: Has been on lovastatin 20 mg with labs as follows:    Lab Results  Component Value Date   CHOL 119 12/30/2021   HDL 38.10 (L) 12/30/2021   LDLCALC 60 12/30/2021   TRIG 104.0 12/30/2021   CHOLHDL 3 12/30/2021           Hypertension: He is taking clonidine, Norvasc, hydralazine and Coreg followed by nephrologist  BP Readings from Last  3 Encounters:  01/05/22 124/66  12/22/21 119/65  12/20/21 (!) 527/78     Complications of diabetes: Nephropathy with renal insufficiency and proteinuria, unknown status of retinopathy, erectile dysfunction  Followed by nephrologist for CKD He is on ESA as well as iron infusions for anemia  Lab Results  Component Value Date   CREATININE 3.43 (H) 12/20/2021   CREATININE 3.71 (H) 11/29/2021   CREATININE 3.88 (H) 11/08/2021    Lab Results  Component Value Date   HGB 11.0 (L) 12/20/2021      LABS:  Lab on 12/30/2021  Component Date Value Ref Range Status   Cholesterol 12/30/2021 119  0 - 200 mg/dL Final   ATP III Classification       Desirable:  < 200 mg/dL               Borderline High:  200 - 239 mg/dL          High:  > = 240 mg/dL   Triglycerides 12/30/2021 104.0  0.0 - 149.0 mg/dL Final   Normal:  <150 mg/dLBorderline High:  150 - 199 mg/dL   HDL 12/30/2021 38.10 (L)  >39.00 mg/dL Final   VLDL 12/30/2021 20.8  0.0 - 40.0 mg/dL Final   LDL Cholesterol 12/30/2021 60  0 - 99 mg/dL Final   Total CHOL/HDL Ratio 12/30/2021 3   Final                  Men          Women1/2 Average Risk     3.4          3.3Average Risk          5.0          4.42X Average Risk           9.6          7.13X Average Risk          15.0          11.0                       NonHDL 12/30/2021 80.53   Final   NOTE:  Non-HDL goal should be 30 mg/dL higher than patient's LDL goal (i.e. LDL goal of < 70 mg/dL, would have non-HDL goal of < 100 mg/dL)   Glucose, Bld 12/30/2021 79  70 - 99 mg/dL Final   Hgb A1c MFr Bld 12/30/2021 5.2  4.6 - 6.5 % Final   Glycemic Control Guidelines for People with Diabetes:Non Diabetic:  <6%Goal of Therapy: <7%Additional Action Suggested:  >8%     Physical Examination:  BP 124/66   Pulse 83   Ht 6' 2" (1.88 m)   Wt (!) 320 lb 12.8 oz (145.5 kg)   SpO2 94%   BMI 41.19 kg/m       ASSESSMENT:  Diabetes type 2, with morbid obesity  See history of present illness for detailed discussion of current diabetes management, blood sugar patterns and problems identified  His last A1c is falsely low at 5.2 again  Fructosamine is usually slightly higher than normal range  Is on basal bolus insulin, Mounjaro 7.5 mg weekly  Because of the holidays he has had some variability in postprandial readings Difficult to assess his weight accurately but he thinks that he can have improved satiety with higher doses of Mounjaro Overall control is still fairly good with GMI 6.8 and time in range 79%  His day-to-day insulin  routine, medication adjustment, insulin doses and prevention of hypoglycemia discussed  Lipids: Well-controlled with lovastatin  PLAN:    No change in basic insulin regimen He if he is interested in switching to [facility] and we can provide him with a sample meter but currently wants to continue the [place] 2 on his arm  Follow-up in 4 months   Patient Instructions  Have apple at start of exercise   Total visit time including counseling = 30 minutes    [name] 01/05/2022, 12:21 PM   Note: This office note was prepared with Dragon voice recognition system technology. Any transcriptional errors that result from this process are  unintentional.

## 2022-01-06 ENCOUNTER — Telehealth: Payer: Self-pay

## 2022-01-06 NOTE — Telephone Encounter (Signed)
Patient called in states that you were going to send in mounjaro '10mg'$ . Please send to [facility] if you are ok with this

## 2022-01-08 ENCOUNTER — Other Ambulatory Visit: Payer: Self-pay | Admitting: Endocrinology

## 2022-01-08 MED ORDER — TIRZEPATIDE 10 MG/0.5ML ~~LOC~~ SOAJ: 10.0000 mg | SUBCUTANEOUS | 1 refills | Status: DC

## 2022-01-10 ENCOUNTER — Ambulatory Visit: Payer: Medicare Other | Attending: Internal Medicine | Admitting: Physical Therapy

## 2022-01-10 ENCOUNTER — Encounter: Payer: Self-pay | Admitting: Physical Therapy

## 2022-01-10 ENCOUNTER — Encounter (HOSPITAL_COMMUNITY)
Admission: RE | Admit: 2022-01-10 | Discharge: 2022-01-10 | Disposition: A | Discharge status: Home / Self Care | Payer: Medicare Other | Source: Ambulatory Visit | Attending: Nephrology | Admitting: Nephrology

## 2022-01-10 VITALS — BP 139/78 | HR 79 | Temp 97.3°F | Resp 17

## 2022-01-10 DIAGNOSIS — N183 Chronic kidney disease, stage 3 unspecified: Secondary | ICD-10-CM | POA: Diagnosis not present

## 2022-01-10 DIAGNOSIS — R2689 Other abnormalities of gait and mobility: Secondary | ICD-10-CM | POA: Diagnosis not present

## 2022-01-10 DIAGNOSIS — D631 Anemia in chronic kidney disease: Secondary | ICD-10-CM | POA: Diagnosis not present

## 2022-01-10 DIAGNOSIS — R2681 Unsteadiness on feet: Secondary | ICD-10-CM | POA: Diagnosis not present

## 2022-01-10 DIAGNOSIS — M6281 Muscle weakness (generalized): Secondary | ICD-10-CM | POA: Insufficient documentation

## 2022-01-10 LAB — FERRITIN: Ferritin: 289 ng/mL (ref 24–336)

## 2022-01-10 LAB — IRON AND TIBC
Iron: 75 ug/dL (ref 45–182)
Saturation Ratios: 25 % (ref 17.9–39.5)
TIBC: 297 ug/dL (ref 250–450)
UIBC: 222 ug/dL

## 2022-01-10 LAB — RENAL FUNCTION PANEL
Albumin: 3.5 g/dL (ref 3.5–5.0)
Anion gap: 12 (ref 5–15)
BUN: 93 mg/dL — ABNORMAL HIGH (ref 6–20)
CO2: 22 mmol/L (ref 22–32)
Calcium: 8.8 mg/dL — ABNORMAL LOW (ref 8.9–10.3)
Chloride: 100 mmol/L (ref 98–111)
Creatinine, Ser: 3.84 mg/dL — ABNORMAL HIGH (ref 0.61–1.24)
GFR, Estimated: 19 mL/min — ABNORMAL LOW (ref >60)
Glucose, Bld: 162 mg/dL — ABNORMAL HIGH (ref 70–99)
Phosphorus: 5 mg/dL — ABNORMAL HIGH (ref 2.5–4.6)
Potassium: 3.7 mmol/L (ref 3.5–5.1)
Sodium: 134 mmol/L — ABNORMAL LOW (ref 135–145)

## 2022-01-10 LAB — POCT HEMOGLOBIN-HEMACUE: Hemoglobin: 11.6 g/dL — ABNORMAL LOW (ref 13.0–17.0)

## 2022-01-10 MED ORDER — DARBEPOETIN ALFA 200 MCG/0.4ML IJ SOSY
PREFILLED_SYRINGE | INTRAMUSCULAR | Status: AC
  Filled 2022-01-10: qty 0.4

## 2022-01-10 MED ORDER — DARBEPOETIN ALFA 200 MCG/0.4ML IJ SOSY
200.0000 ug | PREFILLED_SYRINGE | INTRAMUSCULAR | Status: DC
  Administered 2022-01-10: 200 ug via SUBCUTANEOUS

## 2022-01-10 NOTE — Therapy (Signed)
OUTPATIENT PHYSICAL THERAPY PROSTHETICS TREATMENT   Patient Name: [name] MRN: [id number] DOB:May 24, 1975, 47 y.o., male Today's Date: 01/10/2022  PCP: Starlyn Skeans, MD REFERRING PROVIDER: Charise Killian, MD  END OF SESSION:  PT End of Session - 01/10/22 1322     Visit Number 3    Number of Visits 17    Date for PT Re-Evaluation 02/24/22    Authorization Type MEDICARE PART A AND B    PT Start Time 1316    PT Stop Time 1402    PT Time Calculation (min) 46 min    Equipment Utilized During Treatment --   patient refused gait belt despite therapist education/encouragement; safety measures included wheel chair follow and use of parallel bars   Activity Tolerance Patient tolerated treatment well    Behavior During Therapy WFL for tasks assessed/performed              Past Medical History:  Diagnosis Date   Acute venous embolism and thrombosis of deep vessels of proximal lower extremity ([place]) 07/19/2011   Anemia    Anginal pain ([place])    pt denies   Anxiety    Chest pain, neg MI, normal coronaries by cath 02/18/2013   pt denies   CHF (congestive heart failure) ([place])    Chronic renal disease, stage IV ([place]) 02/19/2013   Colon polyps    Depression    Diabetic ulcer of right foot ([place])    DVT (deep venous thrombosis) ([place]) 09/2002   patient reports additional DVTs in '06 & '11 (unconfirmed)   ED (erectile dysfunction)    GERD (gastroesophageal reflux disease)    History of blood transfusion    "related to OR" (10/31/2016)   Hyperlipidemia 02/19/2013   Hypertension    Nausea & vomiting    "constant for the last couple weeks" (10/31/2016)   Nephrotic syndrome    Obesity    BMI 44, weight 346 pounds 01/30/14   OSA (obstructive sleep apnea) 02/28/2018   Mild obstructive sleep apnea with an AHI of 9.8/h but severe during REM sleep with an AHI of 33.8/h.  Oxygen saturations dropped to 86% and there was moderate snoring   Peripheral edema    Pneumonia    Prosthesis  adjustment 08/17/2016   Pulmonary embolism ([place]) 09/2002   treated with 6 months of warfarin   Pyelonephritis 02/02/2018   Type I diabetes mellitus ([place]) dx'd 2001   Urinary retention    Past Surgical History:  Procedure Laterality Date   AMPUTATION Right 12/22/2013   Procedure: AMPUTATION BELOW KNEE;  Surgeon: [name], MD;  Location: [facility];  Service: Orthopedics;  Laterality: Right;   AMPUTATION Right 02/19/2014   Procedure: RIGHT ABOVE KNEE AMPUTATION ;  Surgeon: [name], MD;  Location: [facility];  Service: Orthopedics;  Laterality: Right;   blood clot removal  2016   CARDIAC CATHETERIZATION  02/18/2013   normal coronaries   ESOPHAGOGASTRODUODENOSCOPY N/A 11/02/2016   Procedure: ESOPHAGOGASTRODUODENOSCOPY (EGD);  Surgeon: [name], MD;  Location[facility] ENDOSCOPY;  Service: Endoscopy;  Laterality: N/A;   I & D EXTREMITY Right 12/14/2013   Procedure: IRRIGATION AND DEBRIDEMENT RIGHT FOOT;  Surgeon: [name], MD;  Location: [facility];  Service: Orthopedics;  Laterality: Right;   INCISION AND DRAINAGE ABSCESS  2007; 2015   "back"   INCISION AND DRAINAGE OF WOUND Right 12/22/2013   Procedure: I&D RIGHT BUTTOCK;  Surgeon: [name], MD;  Location: [facility];  Service: Orthopedics;  Laterality: Right;  LEFT HEART CATHETERIZATION WITH CORONARY ANGIOGRAM N/A 02/18/2013   Procedure: LEFT HEART CATHETERIZATION WITH CORONARY ANGIOGRAM;  Surgeon: [name], MD;  Location: [facility] CATH LAB;  Service: Cardiovascular;  Laterality: N/A;   Patient Active Problem List   Diagnosis Date Noted   Triceps tendonitis 12/22/2021   Trapezius strain, left, initial encounter 02/02/2021   Left shoulder pain 12/30/2020   Acute pulmonary edema ([facility])    Benign paroxysmal positional vertigo 09/14/2020   Anemia associated with chronic renal failure    Hemoglobinuria 12/12/2018   OSA (obstructive sleep apnea) 02/28/2018   Chronic diastolic heart failure ([place]) 02/02/2018   Preventative health care  08/15/2017   Erectile dysfunction 04/22/2017   History of pulmonary embolus (PE) 06/09/2016   Depression 05/04/2016   Hypogonadism in male 05/04/2016   Dyslipidemia 08/12/2014   Unilateral AKA, right ([place]) 12/26/2013   Hyperlipidemia 02/19/2013   S/P cardiac cath, 02/18/13, normal coronaries 02/19/2013   Chronic renal insufficiency, stage 3 (moderate) ([place]) 02/19/2013   Hypertension 07/24/2011   Long term current use of anticoagulant therapy 07/19/2011   History of DVT (deep vein thrombosis) 07/28/2009   Morbid obesity ([place]) 10/26/2005   Insulin dependent type 2 diabetes mellitus, controlled ([place]) 01/09/2002    ONSET DATE: 09/15/2021 (referral date)  REFERRING DIAG: G26.948N (ICD-10-CM) - Unilateral AKA, right ([place])  THERAPY DIAG:  Other abnormalities of gait and mobility  Muscle weakness (generalized)  Unsteadiness on feet  Rationale for Evaluation and Treatment: Rehabilitation  SUBJECTIVE:   SUBJECTIVE STATEMENT: Patient reports he is tired as he got up at 4am like always. Denies falls since last visit and reports no pain. Pt has been working on standing balance w/ RW at home using ball under sound leg to increase weight bearing through RLE (demonstrates to prior and current PT), PT discusses safety measures with doing this at home such as wheelchair behind patient with brakes locked.  Pt accompanied by: self-came via transportation  PERTINENT HISTORY: Insulin dependent DM2, hyperlipidemia, right AKA, chronic diastolic heart failure, HTN, BPPV  PAIN:  Are you having pain? No  PRECAUTIONS: Fall  WEIGHT BEARING RESTRICTIONS: No  FALLS: Has patient fallen in last 6 months? No  LIVING ENVIRONMENT: Lives with: lives alone Lives in: House/apartment Home Access: Level entry Home layout: One level Stairs: No Has following equipment at home: Lobbyist, Environmental consultant - 2 wheeled, Wheelchair (manual), Electronics engineer, and Grab bars  PLOF: Independent with household  mobility with device, Independent with community mobility with device, Independent with transfers, and Needs assistance with homemaking  PATIENT GOALS: "To learn how to walk with this (gestures to prosthetic) effectively, build my endurance and get out of this chair!"  OBJECTIVE:  There were no vitals filed for this visit. Blood sugar was 122 at 1:37pm.  DIAGNOSTIC FINDINGS: No recent relevant imaging.  FUNCTIONAL TESTS:  5 times sit to stand: 17.31 sec w/ BUE support on RW 2 minute walk test: 94.5 feet 2/10 RPE at start 4/10 RPE at end with use of FWW + SBA and wheel chair follow, x3 standing rest breaks <10" 10 meter walk test: 38.22" or 0.26 m/s with FWW + SBA Berg Balance Scale: Not tested due to patients's current functional balance  CURRENT PROSTHETIC WEAR ASSESSMENT: Patient is independent with: skin check, residual limb care, care of non-amputated limb, prosthetic cleaning, ply sock cleaning, correct ply sock adjustment, proper wear schedule/adjustment, and proper weight-bearing schedule/adjustment Patient is dependent with:  N/A Donning prosthesis: Complete Independence Doffing prosthesis: Complete Independence  Prosthetic wear tolerance: 4-8 hours/day, 7 days/week Prosthetic weight bearing tolerance: roughly 5 minutes Edema: pt reports stable limb edema Residual limb condition: Pt reports no open wounds, prefers to not doff liner. Prosthetic description: TF lanyard w/ [place] HD 4-bar mechanical knee (w/ manual lock) - pt states knee is hydraulic K code/activity level with prosthetic use: Level 2   TODAY'S TREATMENT:                                                                                                                                         -STS w/ BUE on // bars x10, prolonged seated rest following due to dyspnea -Repeated bouts of supported standing in // bars progressed to no UE support varying from 2-12 seconds in length, used mirror for postural  feedback -Standing marches x8 each LE prior to seated rest, repeated once more following rest Pt transfers independently to the right W/C to mat level squat pivot x3 -In supine:  pt performs LLE and RLE single leg bridges x10 w/ bolster under residual limb and decreased clearance > trialed bilateral bridge w/ use of bolster under RLE -In side-lying:  pt performs hip extension, discussed use of theraband when with trainer for increased challenge if this position is most tolerable -In prone: hip extension w/ PT preventing trunk rotation and pt requesting "can you please not touch me, I have gas!"  Pt instructed to return to upright, independent in transition. -Session completed w/ pt returning to wheelchair from mat via squat pivot at SBA level to left.  Self Care Home Management: Reinforced safety tips from session prior w/ wheelchair tippers and RW use in home, but further discussed adequate rest to make gains as well as preventing compensations and focusing on quality of movement vs quantity when pushing himself.  Talk to [employer] on 1/9 about sock vs belt options for socket stability (pt has had belt prior and did not like, reports he needs another sock).  Continuing discussion of realistic goals and focus on functional progress as pt is persistent about use of elliptical, completing a 5k, and obtaining a prosthetic for running and aquatic use and meeting these goals by no later than March 2024.    PATIENT EDUCATION: Education details: See self-care section. Person educated: Patient Education method: Explanation Education comprehension: verbalized understanding  HOME EXERCISE PROGRAM: Use of FWW + prosthetic leg with sit to stands during gym sessions in mornings  Printed for patient: -STS (use counter support if needed vs RW) -Standing march (kicking RLE straight) -Standing wide stance w/ counter support progressing to no UE support (use chair behind) *PT forgot to save to patient  instructions following printing.   ASSESSMENT:  CLINICAL IMPRESSION: Session spent working on general endurance, form modifications for activities with and without trainer, and initiated HEP to progress hip strength and balance.  He continues to benefit from skilled PT to further address safety  with functional mobility and to progress tolerance to general activity and ambulatory distance.  Will continue POC.  OBJECTIVE IMPAIRMENTS: Abnormal gait, decreased activity tolerance, decreased balance, decreased endurance, decreased mobility, difficulty walking, decreased ROM, decreased strength, improper body mechanics, prosthetic dependency , and obesity.   ACTIVITY LIMITATIONS: carrying, lifting, bending, standing, squatting, stairs, transfers, and locomotion level  PARTICIPATION LIMITATIONS: meal prep, cleaning, laundry, and driving  PERSONAL FACTORS: Fitness, Time since onset of injury/illness/exacerbation, Transportation, and 1-2 comorbidities: insulin dependent DM2 and HTN  are also affecting patient's functional outcome.   REHAB POTENTIAL: Good  CLINICAL DECISION MAKING: Evolving/moderate complexity  EVALUATION COMPLEXITY: Moderate   GOALS: Goals reviewed with patient? Yes  SHORT TERM GOALS: Target date: 01/27/2022  Pt will be independent with strength and balance HEP to maintain progress. Baseline:  To be established. Goal status: INITIAL  2.  Pt will decrease 5xSTS to </=13 seconds w/ use of only one hand on walker for support in order to demonstrate decreased risk for falls and improved functional bilateral LE strength and power. Baseline: 17.31 sec w/ BUE on RW Goal status: INITIAL  3. Patient will improve 2MWT score to 45 meters to demonstrate improvement in aerobic capacity and endurance needed for ambulating in the community.   Baseline: 28.8 m with SBA with RW and wheel chair follow Goal status: INITIAL  4.  Patient will improve gait speed to 0.4 m/s or greater with  LRAD to indicate an improvement from being a household ambulator to limited community ambulator.   Baseline: 0.26 m/s with RW + SBA Goal status: INITIAL  5.  Patient will demonstrate ability to stand unsupported with wide base gait for 2 minutes without LOB to improve static stability needed for safety within home setting. Baseline: Discontinued Berg as not appropriate for patient at this time. Revised goal to standing balance/component Berg goal; initial on 01/05/2022 5-52" trials with feet approximately 1.5 feet apart Goal status: REVISED  LONG TERM GOALS: Target date: 02/24/2022  Pt will be independent with finalized strengthening HEP for use in the gym at to maintain progress at home. Baseline: To be established. Goal status: INITIAL  2.   Patient will improve 2MWT score to 80 meters to demonstrate improvement in aerobic capacity and endurance needed for ambulating in the community.   Baseline: 28.8 m with SBA with RW and wheel chair follow Goal status: INITIAL  3. Patient will improve gait speed to 0.8 m/s to indicate improvement to the level of community ambulator in order to participate more easily in activities outside of the home.   Baseline:  0.26 m/s with RW + SBA Goal status: INITIAL  4.  Patient will demonstrate ability to stand unsupported with feet less than 8 cm apart for 2 minutes without LOB to improve static stability needed for safety within home setting. Baseline: Discontinued Berg as not appropriate for patient at this time. Revised goal to standing balance/component Berg goal; initial on 01/05/2022 5-52" trials with feet approximately 1.5 feet apart Goal status: REVISED  PLAN:  PT FREQUENCY: 2x/week  PT DURATION: 8 weeks  PLANNED INTERVENTIONS: Therapeutic exercises, Therapeutic activity, Neuromuscular re-education, Balance training, Gait training, Patient/Family education, Self Care, Joint mobilization, Stair training, Vestibular training, Prosthetic training,  DME instructions, Manual therapy, and Re-evaluation  PLAN FOR NEXT SESSION recumbent bike, pt wants to work towards elliptical-edu on safe and realistic goals as pt progresses, pt would like to work towards treadmill-overground tolerance and working on step up efficiency to access the treadmill would likely  be best initially?, leg press-pt request, upright posture, standing balance, endurance, core strength, SLS assisted, step taps w/ sound leg, unsupported balance/weight shifting, glut strength  Elease Etienne, PT, DPT  01/10/2022, 5:45 PM

## 2022-01-10 NOTE — Progress Notes (Signed)
[facility] Attending  Case discussed with Dr. White  At the time of the visit.  We reviewed the resident's history and exam and pertinent patient test results.  I agree with the assessment, diagnosis, and plan of care documented in the resident's note.  

## 2022-01-11 LAB — PTH, INTACT AND CALCIUM
Calcium, Total (PTH): 9.4 mg/dL (ref 8.7–10.2)
PTH: 191 pg/mL — ABNORMAL HIGH (ref 15–65)

## 2022-01-12 ENCOUNTER — Ambulatory Visit: Payer: Medicare Other | Admitting: Physical Therapy

## 2022-01-12 ENCOUNTER — Other Ambulatory Visit: Payer: Self-pay | Admitting: Internal Medicine

## 2022-01-12 ENCOUNTER — Encounter: Payer: Self-pay | Admitting: Physical Therapy

## 2022-01-12 DIAGNOSIS — R2689 Other abnormalities of gait and mobility: Secondary | ICD-10-CM | POA: Diagnosis not present

## 2022-01-12 DIAGNOSIS — R2681 Unsteadiness on feet: Secondary | ICD-10-CM

## 2022-01-12 DIAGNOSIS — M6281 Muscle weakness (generalized): Secondary | ICD-10-CM

## 2022-01-12 DIAGNOSIS — E119 Type 2 diabetes mellitus without complications: Secondary | ICD-10-CM

## 2022-01-12 NOTE — Therapy (Signed)
OUTPATIENT PHYSICAL THERAPY PROSTHETICS TREATMENT   Patient Name: [name] MRN: [id number] DOB:[birth year], 47 y.o., male Today's Date: 01/12/2022  PCP: Starlyn Skeans, MD REFERRING PROVIDER: Charise Killian, MD  END OF SESSION:  PT End of Session - 01/12/22 1122     Visit Number 4    Number of Visits 17    Date for PT Re-Evaluation 02/24/22    Authorization Type MEDICARE PART A AND B    PT Start Time 0925    PT Stop Time 1010    PT Time Calculation (min) 45 min    Equipment Utilized During Treatment Gait belt    Activity Tolerance Patient tolerated treatment well    Behavior During Therapy WFL for tasks assessed/performed              Past Medical History:  Diagnosis Date   Acute venous embolism and thrombosis of deep vessels of proximal lower extremity ([place]) 07/19/2011   Anemia    Anginal pain ([place])    pt denies   Anxiety    Chest pain, neg MI, normal coronaries by cath 02/18/2013   pt denies   CHF (congestive heart failure) ([place])    Chronic renal disease, stage IV ([place]) 02/19/2013   Colon polyps    Depression    Diabetic ulcer of right foot ([place])    DVT (deep venous thrombosis) ([place]) 09/2002   patient reports additional DVTs in '06 & '11 (unconfirmed)   ED (erectile dysfunction)    GERD (gastroesophageal reflux disease)    History of blood transfusion    "related to OR" (10/31/2016)   Hyperlipidemia 02/19/2013   Hypertension    Nausea & vomiting    "constant for the last couple weeks" (10/31/2016)   Nephrotic syndrome    Obesity    BMI 44, weight 346 pounds 01/30/14   OSA (obstructive sleep apnea) 02/28/2018   Mild obstructive sleep apnea with an AHI of 9.8/h but severe during REM sleep with an AHI of 33.8/h.  Oxygen saturations dropped to 86% and there was moderate snoring   Peripheral edema    Pneumonia    Prosthesis adjustment 08/17/2016   Pulmonary embolism ([place]) 09/2002   treated with 6 months of warfarin   Pyelonephritis 02/02/2018   Type I  diabetes mellitus ([place]) dx'd 2001   Urinary retention    Past Surgical History:  Procedure Laterality Date   AMPUTATION Right 12/22/2013   Procedure: AMPUTATION BELOW KNEE;  Surgeon: [name], MD;  Location: [facility];  Service: Orthopedics;  Laterality: Right;   AMPUTATION Right 02/19/2014   Procedure: RIGHT ABOVE KNEE AMPUTATION ;  Surgeon: [name], MD;  Location: [facility];  Service: Orthopedics;  Laterality: Right;   blood clot removal  2016   CARDIAC CATHETERIZATION  02/18/2013   normal coronaries   ESOPHAGOGASTRODUODENOSCOPY N/A 11/02/2016   Procedure: ESOPHAGOGASTRODUODENOSCOPY (EGD);  Surgeon: [name], MD;  Location: [facility];  Service: Endoscopy;  Laterality: N/A;   I & D EXTREMITY Right 12/14/2013   Procedure: IRRIGATION AND DEBRIDEMENT RIGHT FOOT;  Surgeon: [name], MD;  Location: [place];  Service: Orthopedics;  Laterality: Right;   INCISION AND DRAINAGE ABSCESS  2007; 2015   "back"   INCISION AND DRAINAGE OF WOUND Right 12/22/2013   Procedure: I&D RIGHT BUTTOCK;  Surgeon: [name], MD;  Location: [place];  Service: Orthopedics;  Laterality: Right;   LEFT HEART CATHETERIZATION WITH CORONARY ANGIOGRAM N/A 02/18/2013   Procedure: LEFT HEART CATHETERIZATION WITH CORONARY ANGIOGRAM;  Surgeon: [name], MD;  Location: [facility] CATH [facility];  Service: Cardiovascular;  Laterality: N/A;   Patient Active Problem List   Diagnosis Date Noted   Triceps tendonitis 12/22/2021   Trapezius strain, left, initial encounter 02/02/2021   Left shoulder pain 12/30/2020   Acute pulmonary edema ([facility])    Benign paroxysmal positional vertigo 09/14/2020   Anemia associated with chronic renal failure    Hemoglobinuria 12/12/2018   OSA (obstructive sleep apnea) 02/28/2018   Chronic diastolic heart failure ([place]) 02/02/2018   Preventative health care 08/15/2017   Erectile dysfunction 04/22/2017   History of pulmonary embolus (PE) 06/09/2016   Depression 05/04/2016   Hypogonadism in male  05/04/2016   Dyslipidemia 08/12/2014   Unilateral AKA, right ([place]) 12/26/2013   Hyperlipidemia 02/19/2013   S/P cardiac cath, 02/18/13, normal coronaries 02/19/2013   Chronic renal insufficiency, stage 3 (moderate) ([place]) 02/19/2013   Hypertension 07/24/2011   Long term current use of anticoagulant therapy 07/19/2011   History of DVT (deep vein thrombosis) 07/28/2009   Morbid obesity ([place]) 10/26/2005   Insulin dependent type 2 diabetes mellitus, controlled ([place]) 01/09/2002    ONSET DATE: 09/15/2021 (referral date)  REFERRING DIAG: P32.951O (ICD-10-CM) - Unilateral AKA, right ([place])  THERAPY DIAG:  Other abnormalities of gait and mobility  Muscle weakness (generalized)  Unsteadiness on feet  Rationale for Evaluation and Treatment: Rehabilitation  SUBJECTIVE:   SUBJECTIVE STATEMENT: Patient reports continues to be at the gym 6x days a week. Denies changes to medication or falls since last visit. Patient shows therapist video of working on standing hip extension with theraband resistance at gym with UE support with trainers assistance. Therapist reemphasizes safe technique including use of wheel chair and security of liner and recommends this exercise in tandem with home program. Patient also arrives to session with sock but it is far to loose to be safely used during session. Therapist recommends getting a new, better fitting sock from [employer] and patient verbalizes understanding. Patient also agreed to use of gait belt in today's session.   Pt accompanied by: self-came via transportation  PERTINENT HISTORY: Insulin dependent DM2, hyperlipidemia, right AKA, chronic diastolic heart failure, HTN, BPPV  PAIN:  Are you having pain? No  PRECAUTIONS: Fall  WEIGHT BEARING RESTRICTIONS: No  FALLS: Has patient fallen in last 6 months? No  LIVING ENVIRONMENT: Lives with: lives alone Lives in: House/apartment Home Access: Level entry Home layout: One level Stairs: No Has following  equipment at home: Lobbyist, Environmental consultant - 2 wheeled, Wheelchair (manual), Electronics engineer, and Grab bars  PLOF: Independent with household mobility with device, Independent with community mobility with device, Independent with transfers, and Needs assistance with homemaking  PATIENT GOALS: "To learn how to walk with this (gestures to prosthetic) effectively, build my endurance and get out of this chair!"  OBJECTIVE:  There were no vitals filed for this visit. Blood sugar was 122 at 1:37pm.  DIAGNOSTIC FINDINGS: No recent relevant imaging.  FUNCTIONAL TESTS:  5 times sit to stand: 17.31 sec w/ BUE support on RW 2 minute walk test: 94.5 feet 2/10 RPE at start 4/10 RPE at end with use of FWW + SBA and wheel chair follow, x3 standing rest breaks <10" 10 meter walk test: 38.22" or 0.26 m/s with FWW + SBA Berg Balance Scale: Not tested due to patients's current functional balance  CURRENT PROSTHETIC WEAR ASSESSMENT: Patient is independent with: skin check, residual limb care, care of non-amputated limb, prosthetic cleaning, ply sock cleaning, correct  ply sock adjustment, proper wear schedule/adjustment, and proper weight-bearing schedule/adjustment Patient is dependent with:  N/A Donning prosthesis: Complete Independence Doffing prosthesis: Complete Independence Prosthetic wear tolerance: 4-8 hours/day, 7 days/week Prosthetic weight bearing tolerance: roughly 5 minutes Edema: pt reports stable limb edema Residual limb condition: Pt reports no open wounds, prefers to not doff liner. Prosthetic description: TF lanyard w/ [place] HD 4-bar mechanical knee (w/ manual lock) - pt states knee is hydraulic K code/activity level with prosthetic use: Level 2   TODAY'S TREATMENT:                                                                                                                                          NMR -STS w/ BUE on // bars 3x10, prolonged seated rest following due to dyspnea  -  cues to improve RLE weight bearing incluidng foot alignment -Repeated bouts of supported standing in // bars progressed to no UE support varying from 8x6-20 seconds in length, used mirror for postural feedback - Step forward taps to dot with LLE + UE support in // bars to promote RLE weight bearing 3 x 10 - Mini taps to 2" step in // bars with LLE + UE support to promote RLE weight bearing 3 x 10 - Semitandem stride stance with LLE on 2" step in // bars with intermittent repeated holds for 2-8" without UE support - Cross body water bottle pickups in stance with LUE towards the right to promote RLE weight shift in // bars with RUE support 2 x 10  Self Care Home Management: Reinforced safety tips and recommended acquiring new sock from [employer] for better fit. Reinforced importance of HEP in addition to gym strengthening. Education on safe weight shifting tips for home.    PATIENT EDUCATION: Education details: See self-care section. Person educated: Patient Education method: Explanation Education comprehension: verbalized understanding  HOME EXERCISE PROGRAM: Use of FWW + prosthetic leg with sit to stands during gym sessions in mornings  Printed for patient: -STS (use counter support if needed vs RW) -Standing march (kicking RLE straight) -Standing wide stance w/ counter support progressing to no UE support (use chair behind) *PT forgot to save to patient instructions following printing.   ASSESSMENT:  CLINICAL IMPRESSION: Session continued to focus on working on general endurance and RLE weight acceptance. Patient tends to quickly shift LLE back during sit to stands, which prevents RLE weight acceptance. Educated patient on aligning feet before completing to maximize equal weight acceptance. Patient tolerated longer bouts of static holds in today's session and progressed to modified tandem stride stance on 2" step for a few second holds. He continues to benefit from skilled PT to further  address safety with functional mobility and to progress tolerance to general activity and ambulatory distance.  Will continue POC.  OBJECTIVE IMPAIRMENTS: Abnormal gait, decreased activity tolerance, decreased balance, decreased endurance, decreased mobility, difficulty walking, decreased  ROM, decreased strength, improper body mechanics, prosthetic dependency , and obesity.   ACTIVITY LIMITATIONS: carrying, lifting, bending, standing, squatting, stairs, transfers, and locomotion level  PARTICIPATION LIMITATIONS: meal prep, cleaning, laundry, and driving  PERSONAL FACTORS: Fitness, Time since onset of injury/illness/exacerbation, Transportation, and 1-2 comorbidities: insulin dependent DM2 and HTN  are also affecting patient's functional outcome.   REHAB POTENTIAL: Good  CLINICAL DECISION MAKING: Evolving/moderate complexity  EVALUATION COMPLEXITY: Moderate   GOALS: Goals reviewed with patient? Yes  SHORT TERM GOALS: Target date: 01/27/2022  Pt will be independent with strength and balance HEP to maintain progress. Baseline:  To be established. Goal status: INITIAL  2.  Pt will decrease 5xSTS to </=13 seconds w/ use of only one hand on walker for support in order to demonstrate decreased risk for falls and improved functional bilateral LE strength and power. Baseline: 17.31 sec w/ BUE on RW Goal status: INITIAL  3. Patient will improve 2MWT score to 45 meters to demonstrate improvement in aerobic capacity and endurance needed for ambulating in the community.   Baseline: 28.8 m with SBA with RW and wheel chair follow Goal status: INITIAL  4.  Patient will improve gait speed to 0.4 m/s or greater with LRAD to indicate an improvement from being a household ambulator to limited community ambulator.   Baseline: 0.26 m/s with RW + SBA Goal status: INITIAL  5.  Patient will demonstrate ability to stand unsupported with wide base gait for 2 minutes without LOB to improve static stability  needed for safety within home setting. Baseline: Discontinued Berg as not appropriate for patient at this time. Revised goal to standing balance/component Berg goal; initial on 01/05/2022 5-52" trials with feet approximately 1.5 feet apart Goal status: REVISED  LONG TERM GOALS: Target date: 02/24/2022  Pt will be independent with finalized strengthening HEP for use in the gym at to maintain progress at home. Baseline: To be established. Goal status: INITIAL  2.   Patient will improve 2MWT score to 80 meters to demonstrate improvement in aerobic capacity and endurance needed for ambulating in the community.   Baseline: 28.8 m with SBA with RW and wheel chair follow Goal status: INITIAL  3. Patient will improve gait speed to 0.8 m/s to indicate improvement to the level of community ambulator in order to participate more easily in activities outside of the home.   Baseline:  0.26 m/s with RW + SBA Goal status: INITIAL  4.  Patient will demonstrate ability to stand unsupported with feet less than 8 cm apart for 2 minutes without LOB to improve static stability needed for safety within home setting. Baseline: Discontinued Berg as not appropriate for patient at this time. Revised goal to standing balance/component Berg goal; initial on 01/05/2022 5-52" trials with feet approximately 1.5 feet apart Goal status: REVISED  PLAN:  PT FREQUENCY: 2x/week  PT DURATION: 8 weeks  PLANNED INTERVENTIONS: Therapeutic exercises, Therapeutic activity, Neuromuscular re-education, Balance training, Gait training, Patient/Family education, Self Care, Joint mobilization, Stair training, Vestibular training, Prosthetic training, DME instructions, Manual therapy, and Re-evaluation  PLAN FOR NEXT SESSION recumbent bike, pt wants to work towards elliptical-edu on safe and realistic goals as pt progresses, pt would like to work towards treadmill-overground tolerance and working on step up efficiency to access the  treadmill would likely be best initially?, leg press-pt request, upright posture, standing balance, endurance, core strength, med ball slams, modified SLS tasks  Malachi Carl, PT, DPT  01/12/2022, 11:35 AM

## 2022-01-17 ENCOUNTER — Ambulatory Visit: Payer: Medicare Other | Admitting: Physical Therapy

## 2022-01-19 ENCOUNTER — Ambulatory Visit: Payer: Medicare Other | Admitting: Physical Therapy

## 2022-01-19 ENCOUNTER — Encounter: Payer: Self-pay | Admitting: Physical Therapy

## 2022-01-19 VITALS — BP 162/82 | HR 83

## 2022-01-19 DIAGNOSIS — R2681 Unsteadiness on feet: Secondary | ICD-10-CM | POA: Diagnosis not present

## 2022-01-19 DIAGNOSIS — M6281 Muscle weakness (generalized): Secondary | ICD-10-CM | POA: Diagnosis not present

## 2022-01-19 DIAGNOSIS — R2689 Other abnormalities of gait and mobility: Secondary | ICD-10-CM | POA: Diagnosis not present

## 2022-01-19 NOTE — Therapy (Signed)
OUTPATIENT PHYSICAL THERAPY PROSTHETICS TREATMENT   Patient Name: [name] MRN: [id number] DOB:12-13-1975, 47 y.o., male Today's Date: 01/19/2022  PCP: Starlyn Skeans, MD REFERRING PROVIDER: Charise Killian, MD  END OF SESSION:  PT End of Session - 01/19/22 0837     Visit Number 5    Number of Visits 17    Date for PT Re-Evaluation 02/24/22    Authorization Type MEDICARE PART A AND B    PT Start Time 0848    PT Stop Time 0936    PT Time Calculation (min) 48 min    Equipment Utilized During Treatment Gait belt    Activity Tolerance Patient tolerated treatment well    Behavior During Therapy WFL for tasks assessed/performed              Past Medical History:  Diagnosis Date   Acute venous embolism and thrombosis of deep vessels of proximal lower extremity ([place]) 07/19/2011   Anemia    Anginal pain (HCC)    pt denies   Anxiety    Chest pain, neg MI, normal coronaries by cath 02/18/2013   pt denies   CHF (congestive heart failure) (HCC)    Chronic renal disease, stage IV ([place]) 02/19/2013   Colon polyps    Depression    Diabetic ulcer of right foot ([place])    DVT (deep venous thrombosis) ([place]) 09/2002   patient reports additional DVTs in '06 & '11 (unconfirmed)   ED (erectile dysfunction)    GERD (gastroesophageal reflux disease)    History of blood transfusion    "related to OR" (10/31/2016)   Hyperlipidemia 02/19/2013   Hypertension    Nausea & vomiting    "constant for the last couple weeks" (10/31/2016)   Nephrotic syndrome    Obesity    BMI 44, weight 346 pounds 01/30/14   OSA (obstructive sleep apnea) 02/28/2018   Mild obstructive sleep apnea with an AHI of 9.8/h but severe during REM sleep with an AHI of 33.8/h.  Oxygen saturations dropped to 86% and there was moderate snoring   Peripheral edema    Pneumonia    Prosthesis adjustment 08/17/2016   Pulmonary embolism ([place]) 09/2002   treated with 6 months of warfarin   Pyelonephritis 02/02/2018   Type I  diabetes mellitus ([place]) dx'd 2001   Urinary retention    Past Surgical History:  Procedure Laterality Date   AMPUTATION Right 12/22/2013   Procedure: AMPUTATION BELOW KNEE;  Surgeon: [name], MD;  Location: [facility];  Service: Orthopedics;  Laterality: Right;   AMPUTATION Right 02/19/2014   Procedure: RIGHT ABOVE KNEE AMPUTATION ;  Surgeon: [name], MD;  Location: [facility];  Service: Orthopedics;  Laterality: Right;   blood clot removal  2016   CARDIAC CATHETERIZATION  02/18/2013   normal coronaries   ESOPHAGOGASTRODUODENOSCOPY N/A 11/02/2016   Procedure: ESOPHAGOGASTRODUODENOSCOPY (EGD);  Surgeon: [name], MD;  Location: [facility] ENDOSCOPY;  Service: Endoscopy;  Laterality: N/A;   I & D EXTREMITY Right 12/14/2013   Procedure: IRRIGATION AND DEBRIDEMENT RIGHT FOOT;  Surgeon: [name], MD;  Location: [facility];  Service: Orthopedics;  Laterality: Right;   INCISION AND DRAINAGE ABSCESS  2007; 2015   "back"   INCISION AND DRAINAGE OF WOUND Right 12/22/2013   Procedure: I&D RIGHT BUTTOCK;  Surgeon: [name], MD;  Location: [facility];  Service: Orthopedics;  Laterality: Right;   LEFT HEART CATHETERIZATION WITH CORONARY ANGIOGRAM N/A 02/18/2013   Procedure: LEFT HEART CATHETERIZATION WITH CORONARY ANGIOGRAM;  Surgeon: [name], MD;  Location: [facility] CATH LAB;  Service: Cardiovascular;  Laterality: N/A;   Patient Active Problem List   Diagnosis Date Noted   Triceps tendonitis 12/22/2021   Trapezius strain, left, initial encounter 02/02/2021   Left shoulder pain 12/30/2020   Acute pulmonary edema (HCC)    Benign paroxysmal positional vertigo 09/14/2020   Anemia associated with chronic renal failure    Hemoglobinuria 12/12/2018   OSA (obstructive sleep apnea) 02/28/2018   Chronic diastolic heart failure ([place]) 02/02/2018   Preventative health care 08/15/2017   Erectile dysfunction 04/22/2017   History of pulmonary embolus (PE) 06/09/2016   Depression 05/04/2016   Hypogonadism in male  05/04/2016   Dyslipidemia 08/12/2014   Unilateral AKA, right ([place]) 12/26/2013   Hyperlipidemia 02/19/2013   S/P cardiac cath, 02/18/13, normal coronaries 02/19/2013   Chronic renal insufficiency, stage 3 (moderate) ([place]) 02/19/2013   Hypertension 07/24/2011   Long term current use of anticoagulant therapy 07/19/2011   History of DVT (deep vein thrombosis) 07/28/2009   Morbid obesity ([place]) 10/26/2005   Insulin dependent type 2 diabetes mellitus, controlled ([place]) 01/09/2002    ONSET DATE: 09/15/2021 (referral date)  REFERRING DIAG: E95.284X (ICD-10-CM) - Unilateral AKA, right ([place])  THERAPY DIAG:  Other abnormalities of gait and mobility  Muscle weakness (generalized)  Unsteadiness on feet  Rationale for Evaluation and Treatment: Rehabilitation  SUBJECTIVE:   SUBJECTIVE STATEMENT: Patient arrives to session reporting that he is has received a new sock from [employer] that is much better fit than previous liner. Therapist continue to recommend use of hip belt for improved stability with prosthetic on R limb. Patient verbalizes slight increased interest in trying in the future if it will improve walking.   Pt accompanied by: self-came via transportation  PERTINENT HISTORY: Insulin dependent DM2, hyperlipidemia, right AKA, chronic diastolic heart failure, HTN, BPPV  PAIN:  Are you having pain? No  PRECAUTIONS: Fall  WEIGHT BEARING RESTRICTIONS: No  FALLS: Has patient fallen in last 6 months? No  LIVING ENVIRONMENT: Lives with: lives alone Lives in: House/apartment Home Access: Level entry Home layout: One level Stairs: No Has following equipment at home: Lobbyist, Environmental consultant - 2 wheeled, Wheelchair (manual), Electronics engineer, and Grab bars  PLOF: Independent with household mobility with device, Independent with community mobility with device, Independent with transfers, and Needs assistance with homemaking  PATIENT GOALS: "To learn how to walk with this (gestures to  prosthetic) effectively, build my endurance and get out of this chair!"  OBJECTIVE:  Vitals:   01/19/22 0849  BP: (!) 162/82  Pulse: 83   Blood sugar was 109 at 8:48am.  DIAGNOSTIC FINDINGS: No recent relevant imaging.  FUNCTIONAL TESTS:  5 times sit to stand: 17.31 sec w/ BUE support on RW 2 minute walk test: 94.5 feet 2/10 RPE at start 4/10 RPE at end with use of FWW + SBA and wheel chair follow, x3 standing rest breaks <10" 10 meter walk test: 38.22" or 0.26 m/s with FWW + SBA Berg Balance Scale: Not tested due to patients's current functional balance  CURRENT PROSTHETIC WEAR ASSESSMENT: Patient is independent with: skin check, residual limb care, care of non-amputated limb, prosthetic cleaning, ply sock cleaning, correct ply sock adjustment, proper wear schedule/adjustment, and proper weight-bearing schedule/adjustment Patient is dependent with:  N/A Donning prosthesis: Complete Independence Doffing prosthesis: Complete Independence Prosthetic wear tolerance: 4-8 hours/day, 7 days/week Prosthetic weight bearing tolerance: roughly 5 minutes Edema: pt reports stable limb edema Residual limb condition: Pt  reports no open wounds, prefers to not doff liner. Prosthetic description: TF lanyard w/ [place] HD 4-bar mechanical knee (w/ manual lock) - pt states knee is hydraulic K code/activity level with prosthetic use: Level 2   TODAY'S TREATMENT:                                                                                                                                          Therex: Bridging with bulster under RLE 3 x 10 with R hip assist from therapist  - poor clearance and initially tries flexion of RLE; improves with verbal cues, greater clearance L glute than RLE  Hip extension 3 x 10 with overpressure to RLE on set 2-3, bent leg for LLE x 2 sets and straight leg for final sets, overpressure from therapist to reduce hip rotation compensation  Hip abduction 1 x fatigue  bilaterally, with overpressure to RLE on final 5 reps (cues to reduce hip flexion compensation)  There Act: Pt transfering wheelchair to mat squat pivot at SBA level to right w/o AD  Pt returning to wheelchair from mat via squat pivot at SBA level to left w/o AD  TUG trials form wheelchair with 2WW untimed with cues on safety during quarter turns and sit transfers x 3 (CGA-SBA)  PATIENT EDUCATION: Education details: Education on safety with HEP, review new exercises, BP safety Person educated: Patient Education method: Theatre stage manager Education comprehension: verbalized understanding  HOME EXERCISE PROGRAM: Use of FWW + prosthetic leg with sit to stands during gym sessions in mornings  Printed for patient: -STS (use counter support if needed vs RW) -Standing march (kicking RLE straight) -Standing wide stance w/ counter support progressing to no UE support (use chair behind) *PT forgot to save to patient instructions following printing.  URL: [email or web address] Date: 01/19/2022 Prepared by: Malachi Carl  Exercises - Sidelying Hip Abduction (AKA)  - 1 x daily - 7 x weekly - 3 sets - 10 reps - Prone Hip Extension with Residual Limb (AKA)  - 1 x daily - 7 x weekly - 3 sets - 10 reps - Supine Bridge with Bilateral LE and Bolster for RLE (BKA)  - 1 x daily - 7 x weekly - 3 sets - 10 reps   ASSESSMENT:  CLINICAL IMPRESSION: Initiated session with mat level therex for hip and glute strengthening and creation of initial HEP. Patient provided x2 handouts for him to provide one to his trainner per his request. Educated on importance of performing on elevated mat/bed level not on floor for safety. Continued to discuss possibility of belt to reduce hip rotation during gait. Patient verbalized increased interest. Session demonstrates notable reduced strength through bilateral glutes/hips but tolerated session well. Initially demonstrated poor alignment to chair during  initial TUG but improved on remaining trials with therapist education.  He continues to benefit from skilled PT to further address safety with functional  mobility and to progress tolerance to general activity and ambulatory distance.  Will continue POC.  OBJECTIVE IMPAIRMENTS: Abnormal gait, decreased activity tolerance, decreased balance, decreased endurance, decreased mobility, difficulty walking, decreased ROM, decreased strength, improper body mechanics, prosthetic dependency , and obesity.   ACTIVITY LIMITATIONS: carrying, lifting, bending, standing, squatting, stairs, transfers, and locomotion level  PARTICIPATION LIMITATIONS: meal prep, cleaning, laundry, and driving  PERSONAL FACTORS: Fitness, Time since onset of injury/illness/exacerbation, Transportation, and 1-2 comorbidities: insulin dependent DM2 and HTN  are also affecting patient's functional outcome.   REHAB POTENTIAL: Good  CLINICAL DECISION MAKING: Evolving/moderate complexity  EVALUATION COMPLEXITY: Moderate   GOALS: Goals reviewed with patient? Yes  SHORT TERM GOALS: Target date: 01/27/2022  Pt will be independent with strength and balance HEP to maintain progress. Baseline: Provided 01/19/2022 Goal status: INITIAL  2.  Pt will decrease 5xSTS to </=13 seconds w/ use of only one hand on walker for support in order to demonstrate decreased risk for falls and improved functional bilateral LE strength and power. Baseline: 17.31 sec w/ BUE on RW Goal status: INITIAL  3. Patient will improve 2MWT score to 45 meters to demonstrate improvement in aerobic capacity and endurance needed for ambulating in the community.   Baseline: 28.8 m with SBA with RW and wheel chair follow Goal status: INITIAL  4.  Patient will improve gait speed to 0.4 m/s or greater with LRAD to indicate an improvement from being a household ambulator to limited community ambulator.   Baseline: 0.26 m/s with RW + SBA Goal status: INITIAL  5.   Patient will demonstrate ability to stand unsupported with wide base gait for 2 minutes without LOB to improve static stability needed for safety within home setting. Baseline: Discontinued Berg as not appropriate for patient at this time. Revised goal to standing balance/component Berg goal; initial on 01/05/2022 5-52" trials with feet approximately 1.5 feet apart Goal status: REVISED  LONG TERM GOALS: Target date: 02/24/2022  Pt will be independent with finalized strengthening HEP for use in the gym at to maintain progress at home. Baseline: To be established. Goal status: INITIAL  2.   Patient will improve 2MWT score to 80 meters to demonstrate improvement in aerobic capacity and endurance needed for ambulating in the community.   Baseline: 28.8 m with SBA with RW and wheel chair follow Goal status: INITIAL  3. Patient will improve gait speed to 0.8 m/s to indicate improvement to the level of community ambulator in order to participate more easily in activities outside of the home.   Baseline:  0.26 m/s with RW + SBA Goal status: INITIAL  4.  Patient will demonstrate ability to stand unsupported with feet less than 8 cm apart for 2 minutes without LOB to improve static stability needed for safety within home setting. Baseline: Discontinued Berg as not appropriate for patient at this time. Revised goal to standing balance/component Berg goal; initial on 01/05/2022 5-52" trials with feet approximately 1.5 feet apart Goal status: REVISED  PLAN:  PT FREQUENCY: 2x/week  PT DURATION: 8 weeks  PLANNED INTERVENTIONS: Therapeutic exercises, Therapeutic activity, Neuromuscular re-education, Balance training, Gait training, Patient/Family education, Self Care, Joint mobilization, Stair training, Vestibular training, Prosthetic training, DME instructions, Manual therapy, and Re-evaluation  PLAN FOR NEXT SESSION recumbent bike, pt wants to work towards elliptical-edu on safe and realistic goals as  pt progresses, pt would like to work towards treadmill-overground tolerance and working on step up efficiency to access the treadmill would likely be best initially?, leg  press-pt request, upright posture, standing balance, endurance, core strength, med ball slams, modified SLS tasks; review progress on HEP, assess STG next session  Malachi Carl, PT, DPT  01/19/2022, 9:54 AM

## 2022-01-20 ENCOUNTER — Other Ambulatory Visit: Payer: Self-pay | Admitting: Student

## 2022-01-24 ENCOUNTER — Encounter: Payer: Self-pay | Admitting: Student

## 2022-01-24 ENCOUNTER — Ambulatory Visit (INDEPENDENT_AMBULATORY_CARE_PROVIDER_SITE_OTHER): Payer: Medicare Other | Admitting: *Deleted

## 2022-01-24 ENCOUNTER — Ambulatory Visit: Payer: Medicare Other | Admitting: Physical Therapy

## 2022-01-24 ENCOUNTER — Encounter: Payer: Self-pay | Admitting: Physical Therapy

## 2022-01-24 ENCOUNTER — Ambulatory Visit (INDEPENDENT_AMBULATORY_CARE_PROVIDER_SITE_OTHER): Payer: Medicare Other | Admitting: Student

## 2022-01-24 ENCOUNTER — Other Ambulatory Visit: Payer: Self-pay

## 2022-01-24 VITALS — BP 132/71 | HR 86 | Ht 74.0 in | Wt 349.4 lb

## 2022-01-24 VITALS — BP 162/82 | HR 100

## 2022-01-24 DIAGNOSIS — Z794 Long term (current) use of insulin: Secondary | ICD-10-CM

## 2022-01-24 DIAGNOSIS — M6281 Muscle weakness (generalized): Secondary | ICD-10-CM | POA: Diagnosis not present

## 2022-01-24 DIAGNOSIS — E1140 Type 2 diabetes mellitus with diabetic neuropathy, unspecified: Secondary | ICD-10-CM | POA: Insufficient documentation

## 2022-01-24 DIAGNOSIS — I10 Essential (primary) hypertension: Secondary | ICD-10-CM

## 2022-01-24 DIAGNOSIS — Z0000 Encounter for general adult medical examination without abnormal findings: Secondary | ICD-10-CM | POA: Diagnosis not present

## 2022-01-24 DIAGNOSIS — E119 Type 2 diabetes mellitus without complications: Secondary | ICD-10-CM

## 2022-01-24 DIAGNOSIS — R339 Retention of urine, unspecified: Secondary | ICD-10-CM

## 2022-01-24 DIAGNOSIS — R2689 Other abnormalities of gait and mobility: Secondary | ICD-10-CM

## 2022-01-24 DIAGNOSIS — R2681 Unsteadiness on feet: Secondary | ICD-10-CM | POA: Diagnosis not present

## 2022-01-24 DIAGNOSIS — M778 Other enthesopathies, not elsewhere classified: Secondary | ICD-10-CM

## 2022-01-24 DIAGNOSIS — E785 Hyperlipidemia, unspecified: Secondary | ICD-10-CM | POA: Diagnosis not present

## 2022-01-24 MED ORDER — GABAPENTIN 100 MG PO CAPS: 100.0000 mg | ORAL_CAPSULE | Freq: Three times a day (TID) | ORAL | 3 refills | Status: DC

## 2022-01-24 MED ORDER — LOVASTATIN 20 MG PO TABS: 20.0000 mg | ORAL_TABLET | Freq: Every day | ORAL | 3 refills | Status: DC

## 2022-01-24 MED ORDER — TAMSULOSIN HCL 0.4 MG PO CAPS: 0.4000 mg | ORAL_CAPSULE | Freq: Every day | ORAL | 3 refills | Status: DC

## 2022-01-24 NOTE — Assessment & Plan Note (Signed)
Patient has been on tamsulosin 0.4 mg qhs for retention. Requesting refill today. Tolerating medication without any reported side effects.  Plan -refilled tamsulosin

## 2022-01-24 NOTE — Patient Instructions (Signed)
Health Maintenance, Male Adopting a healthy lifestyle and getting preventive care are important in promoting health and wellness. Ask your health care provider about: The right schedule for you to have regular tests and exams. Things you can do on your own to prevent diseases and keep yourself healthy. What should I know about diet, weight, and exercise? Eat a healthy diet  Eat a diet that includes plenty of vegetables, fruits, low-fat dairy products, and lean protein. Do not eat a lot of foods that are high in solid fats, added sugars, or sodium. Maintain a healthy weight Body mass index (BMI) is a measurement that can be used to identify possible weight problems. It estimates body fat based on height and weight. Your health care provider can help determine your BMI and help you achieve or maintain a healthy weight. Get regular exercise Get regular exercise. This is one of the most important things you can do for your health. Most adults should: Exercise for at least 150 minutes each week. The exercise should increase your heart rate and make you sweat (moderate-intensity exercise). Do strengthening exercises at least twice a week. This is in addition to the moderate-intensity exercise. Spend less time sitting. Even light physical activity can be beneficial. Watch cholesterol and blood lipids Have your blood tested for lipids and cholesterol at 46 years of age, then have this test every 5 years. You may need to have your cholesterol levels checked more often if: Your lipid or cholesterol levels are high. You are older than 46 years of age. You are at high risk for heart disease. What should I know about cancer screening? Many types of cancers can be detected early and may often be prevented. Depending on your health history and family history, you may need to have cancer screening at various ages. This may include screening for: Colorectal cancer. Prostate cancer. Skin cancer. Lung  cancer. What should I know about heart disease, diabetes, and high blood pressure? Blood pressure and heart disease High blood pressure causes heart disease and increases the risk of stroke. This is more likely to develop in people who have high blood pressure readings or are overweight. Talk with your health care provider about your target blood pressure readings. Have your blood pressure checked: Every 3-5 years if you are 18-39 years of age. Every year if you are 40 years old or older. If you are between the ages of 65 and 75 and are a current or former smoker, ask your health care provider if you should have a one-time screening for abdominal aortic aneurysm (AAA). Diabetes Have regular diabetes screenings. This checks your fasting blood sugar level. Have the screening done: Once every three years after age 45 if you are at a normal weight and have a low risk for diabetes. More often and at a younger age if you are overweight or have a high risk for diabetes. What should I know about preventing infection? Hepatitis B If you have a higher risk for hepatitis B, you should be screened for this virus. Talk with your health care provider to find out if you are at risk for hepatitis B infection. Hepatitis C Blood testing is recommended for: Everyone born from 1945 through 1965. Anyone with known risk factors for hepatitis C. Sexually transmitted infections (STIs) You should be screened each year for STIs, including gonorrhea and chlamydia, if: You are sexually active and are younger than 46 years of age. You are older than 46 years of age and your   health care provider tells you that you are at risk for this type of infection. Your sexual activity has changed since you were last screened, and you are at increased risk for chlamydia or gonorrhea. Ask your health care provider if you are at risk. Ask your health care provider about whether you are at high risk for HIV. Your health care provider  may recommend a prescription medicine to help prevent HIV infection. If you choose to take medicine to prevent HIV, you should first get tested for HIV. You should then be tested every 3 months for as long as you are taking the medicine. Follow these instructions at home: Alcohol use Do not drink alcohol if your health care provider tells you not to drink. If you drink alcohol: Limit how much you have to 0-2 drinks a day. Know how much alcohol is in your drink. In the [place] one drink equals one 12 oz bottle of beer (355 mL), one 5 oz glass of wine (148 mL), or one 1 oz glass of hard liquor (44 mL). Lifestyle Do not use any products that contain nicotine or tobacco. These products include cigarettes, chewing tobacco, and vaping devices, such as e-cigarettes. If you need help quitting, ask your health care provider. Do not use street drugs. Do not share needles. Ask your health care provider for help if you need support or information about quitting drugs. General instructions Schedule regular health, dental, and eye exams. Stay current with your vaccines. Tell your health care provider if: You often feel depressed. You have ever been abused or do not feel safe at home. Summary Adopting a healthy lifestyle and getting preventive care are important in promoting health and wellness. Follow your health care provider's instructions about healthy diet, exercising, and getting tested or screened for diseases. Follow your health care provider's instructions on monitoring your cholesterol and blood pressure. This information is not intended to replace advice given to you by your health care provider. Make sure you discuss any questions you have with your health care provider. Document Revised: 05/17/2020 Document Reviewed: 05/17/2020 Elsevier Patient Education  2023 Elsevier Inc.  

## 2022-01-24 NOTE — Patient Instructions (Signed)
Thank you, [name] for allowing [place] to provide your care today.   -I have sent refills for Gabapentin, Tamsulosin and Lovastatin -I am glad to hear your left elbow is feeling better.  -Let's plan for foot exam at your next visit.  -We will check for protein in your urine today if able.   I have ordered the following labs for you:  Lab Orders         Microalbumin / Creatinine Urine Ratio       I have ordered the following medication/changed the following medications:   Stop the following medications: Medications Discontinued During This Encounter  Medication Reason   gabapentin (NEURONTIN) 100 MG capsule Reorder   tamsulosin (FLOMAX) 0.4 MG CAPS capsule Reorder   lovastatin (MEVACOR) 20 MG tablet Reorder     Start the following medications: Meds ordered this encounter  Medications   gabapentin (NEURONTIN) 100 MG capsule    Sig: Take 1 capsule (100 mg total) by mouth 3 (three) times daily.    Dispense:  270 capsule    Refill:  3   tamsulosin (FLOMAX) 0.4 MG CAPS capsule    Sig: Take 1 capsule (0.4 mg total) by mouth at bedtime.    Dispense:  90 capsule    Refill:  3   lovastatin (MEVACOR) 20 MG tablet    Sig: Take 1 tablet (20 mg total) by mouth daily.    Dispense:  90 tablet    Refill:  3     Follow up: 4-6 months    Should you have any questions or concerns please call the internal medicine clinic at [phone].    Angelique Blonder, D.O. [place]

## 2022-01-24 NOTE — Assessment & Plan Note (Addendum)
Last LDL from 12/2021 at goal. On Lovastatin 20 mg daily. Tolerating without any reported side effects.   Plan -refilled lovastatin

## 2022-01-24 NOTE — Addendum Note (Signed)
Addended by: Angelique Blonder on: 01/24/2022 05:12 PM   Modules accepted: Level of Service

## 2022-01-24 NOTE — Therapy (Signed)
OUTPATIENT PHYSICAL THERAPY PROSTHETICS TREATMENT   Patient Name: [name] MRN: [id number] DOB:1975/10/09, 47 y.o., male Today's Date: 01/24/2022  PCP: Starlyn Skeans, MD REFERRING PROVIDER: Charise Killian, MD  END OF SESSION:  PT End of Session - 01/24/22 1616     Visit Number 6    Number of Visits 17    Date for PT Re-Evaluation 02/24/22    Authorization Type MEDICARE PART A AND B    PT Start Time 1614    PT Stop Time 1700    PT Time Calculation (min) 46 min    Equipment Utilized During Treatment Gait belt    Activity Tolerance Patient tolerated treatment well    Behavior During Therapy WFL for tasks assessed/performed              Past Medical History:  Diagnosis Date   Acute venous embolism and thrombosis of deep vessels of proximal lower extremity ([place]) 07/19/2011   Anemia    Anginal pain (HCC)    pt denies   Anxiety    Chest pain, neg MI, normal coronaries by cath 02/18/2013   pt denies   CHF (congestive heart failure) (HCC)    Chronic renal disease, stage IV ([place]) 02/19/2013   Colon polyps    Depression    Diabetic ulcer of right foot ([place])    DVT (deep venous thrombosis) ([place]) 09/2002   patient reports additional DVTs in '06 & '11 (unconfirmed)   ED (erectile dysfunction)    GERD (gastroesophageal reflux disease)    History of blood transfusion    "related to OR" (10/31/2016)   Hyperlipidemia 02/19/2013   Hypertension    Nausea & vomiting    "constant for the last couple weeks" (10/31/2016)   Nephrotic syndrome    Obesity    BMI 44, weight 346 pounds 01/30/14   OSA (obstructive sleep apnea) 02/28/2018   Mild obstructive sleep apnea with an AHI of 9.8/h but severe during REM sleep with an AHI of 33.8/h.  Oxygen saturations dropped to 86% and there was moderate snoring   Peripheral edema    Pneumonia    Prosthesis adjustment 08/17/2016   Pulmonary embolism ([place]) 09/2002   treated with 6 months of warfarin   Pyelonephritis 02/02/2018   Type I  diabetes mellitus ([place]) dx'd 2001   Urinary retention    Past Surgical History:  Procedure Laterality Date   AMPUTATION Right 12/22/2013   Procedure: AMPUTATION BELOW KNEE;  Surgeon: [name], MD;  Location: [facility];  Service: Orthopedics;  Laterality: Right;   AMPUTATION Right 02/19/2014   Procedure: RIGHT ABOVE KNEE AMPUTATION ;  Surgeon: [name], MD;  Location: [facility];  Service: Orthopedics;  Laterality: Right;   blood clot removal  2016   CARDIAC CATHETERIZATION  02/18/2013   normal coronaries   ESOPHAGOGASTRODUODENOSCOPY N/A 11/02/2016   Procedure: ESOPHAGOGASTRODUODENOSCOPY (EGD);  Surgeon: [name], MD;  Location: [facility] ENDOSCOPY;  Service: Endoscopy;  Laterality: N/A;   I & D EXTREMITY Right 12/14/2013   Procedure: IRRIGATION AND DEBRIDEMENT RIGHT FOOT;  Surgeon: [name], MD;  Location: [facility];  Service: Orthopedics;  Laterality: Right;   INCISION AND DRAINAGE ABSCESS  2007; 2015   "back"   INCISION AND DRAINAGE OF WOUND Right 12/22/2013   Procedure: I&D RIGHT BUTTOCK;  Surgeon: [name], MD;  Location: [facility];  Service: Orthopedics;  Laterality: Right;   LEFT HEART CATHETERIZATION WITH CORONARY ANGIOGRAM N/A 02/18/2013   Procedure: LEFT HEART CATHETERIZATION WITH CORONARY ANGIOGRAM;  Surgeon: [name], MD;  Location: [facility] CATH LAB;  Service: Cardiovascular;  Laterality: N/A;   Patient Active Problem List   Diagnosis Date Noted   Diabetic neuropathy ([place]) 01/24/2022   Trapezius strain, left, initial encounter 02/02/2021   Left shoulder pain 12/30/2020   Acute pulmonary edema (HCC)    Benign paroxysmal positional vertigo 09/14/2020   Anemia associated with chronic renal failure    Hemoglobinuria 12/12/2018   OSA (obstructive sleep apnea) 02/28/2018   Chronic diastolic heart failure ([place]) 02/02/2018   Preventative health care 08/15/2017   Erectile dysfunction 04/22/2017   History of pulmonary embolus (PE) 06/09/2016   Depression 05/04/2016   Hypogonadism  in male 05/04/2016   Urinary retention 05/04/2016   Dyslipidemia 08/12/2014   Unilateral AKA, right ([place]) 12/26/2013   Hyperlipidemia 02/19/2013   S/P cardiac cath, 02/18/13, normal coronaries 02/19/2013   Chronic renal insufficiency, stage 3 (moderate) ([place]) 02/19/2013   Hypertension 07/24/2011   Long term current use of anticoagulant therapy 07/19/2011   History of DVT (deep vein thrombosis) 07/28/2009   Morbid obesity ([place]) 10/26/2005   Insulin dependent type 2 diabetes mellitus, controlled ([place]) 01/09/2002    ONSET DATE: 09/15/2021 (referral date)  REFERRING DIAG: I96.789F (ICD-10-CM) - Unilateral AKA, right ([place])  THERAPY DIAG:  Other abnormalities of gait and mobility  Muscle weakness (generalized)  Unsteadiness on feet  Rationale for Evaluation and Treatment: Rehabilitation  SUBJECTIVE:   SUBJECTIVE STATEMENT: Patient reports that he has been 100% compliant with home program. He states that L hip is sore from doing his home program - muscle soreness due to compliance. Denies changes to medications.   Pt accompanied by: self-came via transportation  PERTINENT HISTORY: Insulin dependent DM2, hyperlipidemia, right AKA, chronic diastolic heart failure, HTN, BPPV  PAIN:  Are you having pain? Yes: NPRS scale: 4/10 Pain location: L hip Pain description: achey, muscle soreness from working out Aggravating factors: working out Relieving factors: unknown at this time but plans to try heating pad/massage gun/icey hot  PRECAUTIONS: Fall  WEIGHT BEARING RESTRICTIONS: No  FALLS: Has patient fallen in last 6 months? No  LIVING ENVIRONMENT: Lives with: lives alone Lives in: House/apartment Home Access: Level entry Home layout: One level Stairs: No Has following equipment at home: Lobbyist, Environmental consultant - 2 wheeled, Wheelchair (manual), Electronics engineer, and Grab bars  PLOF: Independent with household mobility with device, Independent with community mobility with  device, Independent with transfers, and Needs assistance with homemaking  PATIENT GOALS: "To learn how to walk with this (gestures to prosthetic) effectively, build my endurance and get out of this chair!"  OBJECTIVE:  Vitals:   01/24/22 1623  BP: (!) 162/82  Pulse: 100    Blood sugar was 144 at 4:19pm.  DIAGNOSTIC FINDINGS: No recent relevant imaging.  FUNCTIONAL TESTS:  5 times sit to stand: 16.32" sec w/ BUE support in // bars; '1\' 32"'$  with RUE support in // bars 2 minute walk test: 60 feet 2/10 RPE at start 4/10 RPE at end with use of FWW + SBA and wheel chair follow, x3 standing rest breaks <10" 10 meter walk test: 56" or 0.17 m/s with FWW + SBA Berg Balance Scale: Not tested due to patients's current functional balance  CURRENT PROSTHETIC WEAR ASSESSMENT: Patient is independent with: skin check, residual limb care, care of non-amputated limb, prosthetic cleaning, ply sock cleaning, correct ply sock adjustment, proper wear schedule/adjustment, and proper weight-bearing schedule/adjustment Patient is dependent with:  N/A Donning prosthesis: Complete Independence  Doffing prosthesis: Complete Independence Prosthetic wear tolerance: 4-8 hours/day, 7 days/week Prosthetic weight bearing tolerance: roughly 5 minutes Edema: pt reports stable limb edema Residual limb condition: Pt reports no open wounds, prefers to not doff liner. Prosthetic description: TF lanyard w/ [place] HD 4-bar mechanical knee (w/ manual lock) - pt states knee is hydraulic K code/activity level with prosthetic use: Level 2   TODAY'S TREATMENT:                                                                                                                                          Retest STG: 5 times sit to stand: 16.32" sec w/ BUE support in // bars; '1\' 32"'$  with RUE support in // bars 2 minute walk test: 60 feet 2/10 RPE at start 4/10 RPE at end with use of FWW + SBA and wheel chair follow, x3 standing rest breaks  <10" 10 meter walk test: 56" or 0.17 m/s with FWW + SBA Static standing balance without UE support with WBOS x 62" (SBA)   Remainder of session: - 1 x 60' with wheel chair follow with FWW+ SBA; cues to lead with toe on LLE to prevent toe dragging  - patient presents with significant LLE bow legging with pivoting on front half of foot; initially drags through on LLE but improves with cue, kicks out prosthetic and relies strongly on momentum - Standing balance in // bars with cross body reaching to the right to promote RLE weight acceptance 1 x 5 - patient unable to complete more due to end of session fatigue - Sit to stands from [facility] with RUE only on // bars only 2 x 5 (SBA)  PATIENT EDUCATION: Education details: Education on safety with HEP Person educated: Patient Education method: Theatre stage manager Education comprehension: verbalized understanding  HOME EXERCISE PROGRAM: Use of FWW + prosthetic leg with sit to stands during gym sessions in mornings  Printed for patient: -STS (use counter support if needed vs RW) -Standing march (kicking RLE straight) -Standing wide stance w/ counter support progressing to no UE support (use chair behind) *PT forgot to save to patient instructions following printing.  URL: [email or web address] Date: 01/19/2022 Prepared by: Malachi Carl  Exercises - Sidelying Hip Abduction (AKA)  - 1 x daily - 7 x weekly - 3 sets - 10 reps - Prone Hip Extension with Residual Limb (AKA)  - 1 x daily - 7 x weekly - 3 sets - 10 reps - Supine Bridge with Bilateral LE and Bolster for RLE (BKA)  - 1 x daily - 7 x weekly - 3 sets - 10 reps   ASSESSMENT:  CLINICAL IMPRESSION: Session emphasized review of short term goals with progression of sit to stands in // bars with single UE use. Patient demonstrated reduced gait speed in today's session likely due to increased muscle soreness noted at start of session and fatigue after  gym. Patient progressed  speed of sit to stands and tolerance for static standing balance. Continues to be limited by fatigue and need for breaks between activities but tolerated walking during 2MWT with only 1 standing break instead of 3 on initial eval. He continues to benefit from skilled PT to further address safety with functional mobility and to progress tolerance to general activity and ambulatory distance.  Will continue POC.  OBJECTIVE IMPAIRMENTS: Abnormal gait, decreased activity tolerance, decreased balance, decreased endurance, decreased mobility, difficulty walking, decreased ROM, decreased strength, improper body mechanics, prosthetic dependency , and obesity.   ACTIVITY LIMITATIONS: carrying, lifting, bending, standing, squatting, stairs, transfers, and locomotion level  PARTICIPATION LIMITATIONS: meal prep, cleaning, laundry, and driving  PERSONAL FACTORS: Fitness, Time since onset of injury/illness/exacerbation, Transportation, and 1-2 comorbidities: insulin dependent DM2 and HTN  are also affecting patient's functional outcome.   REHAB POTENTIAL: Good  CLINICAL DECISION MAKING: Evolving/moderate complexity  EVALUATION COMPLEXITY: Moderate   GOALS: Goals reviewed with patient? Yes  SHORT TERM GOALS: Target date: 01/27/2022  Pt will be independent with strength and balance HEP to maintain progress. Baseline: Provided 01/19/2022; Met on 01/24/2022 Goal status: MET  2.  Pt will decrease 5xSTS to </=13 seconds w/ use of only one hand on walker for support in order to demonstrate decreased risk for falls and improved functional bilateral LE strength and power. Baseline: 17.31 sec w/ BUE on RW; 01/24/2022 16.12 sec with BU in //; ' 32" with RUE support in // bars Goal status: IN PROGRESS  3. Patient will improve 2MWT score to 35 meters to demonstrate improvement in aerobic capacity and endurance needed for ambulating in the community.   Baseline: 28.8 m with SBA with RW and wheel chair follow; 18  meters with SBA w/ RW but increased soreness impacting patient this session 01/24/2022 Goal status: REVISED  4.  Patient will improve gait speed to 0.35 m/s or greater with LRAD to indicate an improvement from being a household ambulator to limited community ambulator.   Baseline: 0.26 m/s with RW + SBA; 0.17 m/s with RW + SBA Goal status: REVISED  5.  Patient will demonstrate ability to stand unsupported with wide base gait for 2 minutes without LOB to improve static stability needed for safety within home setting. Baseline: Discontinued Berg as not appropriate for patient at this time. Revised goal to standing balance/component Berg goal; initial on 01/05/2022 5-52" trials with feet approximately 1.5 feet apart; improved to 62" on 01/24/2022 Goal status: REVISED  LONG TERM GOALS: Target date: 02/24/2022  Pt will be independent with finalized strengthening HEP for use in the gym at to maintain progress at home. Baseline: To be established. Goal status: INITIAL  2.   Patient will improve 2MWT score to 80 meters to demonstrate improvement in aerobic capacity and endurance needed for ambulating in the community.   Baseline: 28.8 m with SBA with RW and wheel chair follow; 18 meters with SBA w/ RW but increased soreness impacting patient this session 01/24/2022 Goal status: INITIAL  3. Patient will improve gait speed to 0.6 m/s to indicate improvement to the level of limited community ambulator in order to participate more easily in activities outside of the home.    Baseline:  0.26 m/s with RW + SBA;  0.17 m/s with RW + SBA Goal status: REVISED  4.  Patient will demonstrate ability to stand unsupported with feet less than 8 cm apart for 2 minutes without LOB to improve static stability needed for safety  within home setting. Baseline: Discontinued Berg as not appropriate for patient at this time. Revised goal to standing balance/component Berg goal; initial on 01/05/2022 5-52" trials with feet  approximately 1.5 feet apart; improved to 62" on 01/24/2022 Goal status: REVISED  PLAN:  PT FREQUENCY: 2x/week  PT DURATION: 8 weeks  PLANNED INTERVENTIONS: Therapeutic exercises, Therapeutic activity, Neuromuscular re-education, Balance training, Gait training, Patient/Family education, Self Care, Joint mobilization, Stair training, Vestibular training, Prosthetic training, DME instructions, Manual therapy, and Re-evaluation  PLAN FOR NEXT SESSION recumbent bike, pt wants to work towards elliptical-edu on safe and realistic goals as pt progresses, pt would like to work towards treadmill-overground tolerance and working on step up efficiency to access the treadmill would likely be best initially?, leg press-pt request, upright posture, standing balance, endurance, core strength, med ball slams, modified SLS tasks; review progress on HEP  Malachi Carl, PT, DPT 01/24/2022, 5:33 PM

## 2022-01-24 NOTE — Assessment & Plan Note (Signed)
Patient reports left elbow pain has resolved. Wears brace when doing crossfit now. Exam today was unremarkable, no swelling, tenderness or deformity with pain-free ROM of left elbow.

## 2022-01-24 NOTE — Progress Notes (Signed)
CC: medication refills  HPI:  [name] is a 47 y.o. male living with a history stated below and presents today for medication refills. Please see problem based assessment and plan for additional details.  Past Medical History:  Diagnosis Date   Acute venous embolism and thrombosis of deep vessels of proximal lower extremity (HCC) 07/19/2011   Anemia    Anginal pain (HCC)    pt denies   Anxiety    Chest pain, neg MI, normal coronaries by cath 02/18/2013   pt denies   CHF (congestive heart failure) (HCC)    Chronic renal disease, stage IV ([place]) 02/19/2013   Colon polyps    Depression    Diabetic ulcer of right foot ([place])    DVT (deep venous thrombosis) ([place]) 09/2002   patient reports additional DVTs in '06 & '11 (unconfirmed)   ED (erectile dysfunction)    GERD (gastroesophageal reflux disease)    History of blood transfusion    "related to OR" (10/31/2016)   Hyperlipidemia 02/19/2013   Hypertension    Nausea & vomiting    "constant for the last couple weeks" (10/31/2016)   Nephrotic syndrome    Obesity    BMI 44, weight 346 pounds 01/30/14   OSA (obstructive sleep apnea) 02/28/2018   Mild obstructive sleep apnea with an AHI of 9.8/h but severe during REM sleep with an AHI of 33.8/h.  Oxygen saturations dropped to 86% and there was moderate snoring   Peripheral edema    Pneumonia    Prosthesis adjustment 08/17/2016   Pulmonary embolism ([place]) 09/2002   treated with 6 months of warfarin   Pyelonephritis 02/02/2018   Type I diabetes mellitus ([place]) dx'd 2001   Urinary retention     Current Outpatient Medications on File Prior to Visit  Medication Sig Dispense Refill   tirzepatide (MOUNJARO) 10 MG/0.5ML Pen Inject 10 mg into the skin once a week. 6 mL 1   Accu-Chek Softclix Lancets lancets Check blood sugar up to 3 times a day (Patient not taking: Reported on 02/09/2021) 300 each 3   acidophilus (RISAQUAD) CAPS capsule Take 1 capsule by mouth daily.      Alpha-Lipoic Acid 600 MG CAPS Take 600 mg by mouth daily.     amLODipine (NORVASC) 10 MG tablet Take 10 mg by mouth at bedtime.     Blood Glucose Monitoring Suppl (ACCU-CHEK GUIDE ME) w/Device KIT Use to check blood sugar up to 3 times a day (Patient not taking: Reported on 02/09/2021) 1 kit 1   carvedilol (COREG) 25 MG tablet TAKE 1 TABLET BY MOUTH TWICE DAILY WITH A MEAL 180 tablet 2   chlorthalidone (HYGROTON) 25 MG tablet Take 25 mg by mouth daily.     cloNIDine (CATAPRES - DOSED IN MG/24 HR) 0.1 mg/24hr patch 0.1 mg once a week.     Cyanocobalamin (B-12) 500 MCG SUBL Place 500 mcg under the tongue daily.      cyclobenzaprine (FLEXERIL) 5 MG tablet Take 1 tablet (5 mg total) by mouth at bedtime as needed for muscle spasms. 10 tablet 0   diclofenac Sodium (VOLTAREN) 1 % GEL Apply 2 g topically 4 (four) times daily. 100 g 3   ELIQUIS 5 MG TABS tablet Take 1 tablet by mouth twice daily 180 tablet 0   famotidine (PEPCID) 20 MG tablet Take 1 tablet (20 mg total) by mouth 2 (two) times daily. (Patient taking differently: Take 20 mg by mouth 2 (two) times daily as needed for heartburn.)  30 tablet 0   fluticasone (FLONASE) 50 MCG/ACT nasal spray Place 2 sprays into both nostrils daily. (Patient taking differently: Place 2 sprays into both nostrils daily as needed for allergies.) 15.8 mL 2   furosemide (LASIX) 80 MG tablet Take 1 tablet by mouth twice daily (Patient taking differently: Take 80 mg by mouth 2 (two) times daily.) 180 tablet 0   glucose blood (ACCU-CHEK GUIDE) test strip Check blood sugar up to 3 times a day (Patient not taking: Reported on 02/09/2021) 300 each 3   hydrALAZINE (APRESOLINE) 100 MG tablet Take 100 mg by mouth 3 (three) times daily.     Insulin Pen Needle (B-D UF III MINI PEN NEEDLES) 31G X 5 MM MISC 1 each by Other route See admin instructions. USE 4-5 TIMES DAILY 200 each 0   Insulin Pen Needle (B-D UF III MINI PEN NEEDLES) 31G X 5 MM MISC USE 1  FIVE TIMES DAILY 400 each 0    Lancet Devices (ACCU-CHEK SOFTCLIX) lancets Use to check blood sugars 5 times a day. Dx code:E11.9. Insulin dependent. (Patient taking differently: 1 each by Other route See admin instructions. Use to check blood sugars 5 times a day. Dx code:E11.9. Insulin dependent.) 6 each 3   lidocaine (HM LIDOCAINE PATCH) 4 % Place 1 patch onto the skin daily. 5 patch 0   methocarbamol (ROBAXIN) 500 MG tablet Take 1 tablet (500 mg total) by mouth 3 (three) times daily. 15 tablet 0   NOVOLOG FLEXPEN 100 UNIT/ML FlexPen Inject 15-30 Units into the skin 3 (three) times daily with meals. Administer 15 units in the morning with breakfast, 25 units with lunch, and 30 units with dinner daily.  ICD Code:  E11.9 15 mL 0   oxybutynin (DITROPAN) 5 MG tablet TAKE 1 TABLET BY MOUTH THREE TIMES DAILY 90 tablet 0   polyethylene glycol (MIRALAX / GLYCOLAX) 17 g packet Take 17 g by mouth 2 (two) times daily. (Patient taking differently: Take 17 g by mouth as needed for mild constipation.) 14 each 0   potassium chloride SA (KLOR-CON) 20 MEQ tablet Take 20 mEq by mouth 2 (two) times daily.     senna-docusate (SENOKOT-S) 8.6-50 MG tablet Take 1 tablet by mouth daily. (Patient taking differently: Take 1 tablet by mouth as needed for mild constipation.) 90 tablet 3   sildenafil (VIAGRA) 50 MG tablet Take 1-2 tablets (50-100 mg total) by mouth as needed for erectile dysfunction. Take 1 hour before needed. Do not take more than 2 tablets at once. 20 tablet 0   TRESIBA FLEXTOUCH 200 UNIT/ML FlexTouch Pen INJECT 96 UNITS SUBCUTANEOUSLY ONCE DAILY (Patient taking differently: INJECT 48 UNITS SUBCUTANEOUSLY ONCE DAILY) 45 mL 0   No current facility-administered medications on file prior to visit.    Review of Systems: ROS negative except for what is noted on the assessment and plan.  Vitals:   01/24/22 1450  BP: 132/71  Pulse: 86  SpO2: 96%  Weight: (!) 349 lb 6.4 oz (158.5 kg)  Height: '6\' 2"'$  (1.88 m)   Physical  Exam: Constitutional: well-appearing male sitting in wheelchair, in no acute distress HENT: normocephalic atraumatic Cardiovascular: regular rate Pulmonary/Chest: normal work of breathing on room air MSK: Left elbow- no swelling, tenderness or deformity, normal ROM; Right AKA Neurological: alert & oriented x 3 Skin: warm and dry Psych: pleasant mood  Assessment & Plan:   Hyperlipidemia Last LDL from 12/2021 at goal. On Lovastatin 20 mg daily. Tolerating without any reported side effects.  Plan -refilled lovastatin  Triceps tendonitis Patient reports left elbow pain has resolved. Wears brace when doing crossfit now. Exam today was unremarkable, no swelling, tenderness or deformity with pain-free ROM of left elbow.   Hypertension BP Readings from Last 3 Encounters:  01/24/22 132/71  01/19/22 (!) 162/82  01/10/22 139/78   BP well-controlled today on Coreg 25 mg BID, amlodipine 10 mg and hydralazine 100 mg TID. He follows with [facility].  Plan -Continue Coreg, amlodipine and hydralazine   Diabetic neuropathy ([place]) Patient reports hx of diabetic neuropathy which he is taking gabapentin. Requesting refill today. Tolerating medication without any reported side effects. Patient declined foot exam today, willing to complete at next visit. He regularly checks his left foot for any changes.   Plan -refilled gabapentin 100 mg TID -patient declined foot exam, will assess at next visit   Urinary retention Patient has been on tamsulosin 0.4 mg qhs for retention. Requesting refill today. Tolerating medication without any reported side effects.  Plan -refilled tamsulosin    Patient discussed with Dr. Juluis Mire, D.O. [facility], PGY-1 Phone: [phone] Date 01/24/2022 Time 5:08 PM

## 2022-01-24 NOTE — Progress Notes (Signed)
Subjective:   [name] is a 47 y.o. male who presents for an Initial Medicare Annual Wellness Visit.  Review of Systems    Defer to pcp        Objective:    Today's Vitals   01/24/22 1450  BP: 132/71  Pulse: 86  SpO2: 96%  Weight: (!) 349 lb 6.9 oz (158.5 kg)  Height: '6\' 2"'$  (1.88 m)   Body mass index is 44.86 kg/m.     01/24/2022    4:48 PM 01/24/2022    2:54 PM 12/29/2021    9:34 AM 12/22/2021    9:17 AM 09/15/2021    2:02 PM 01/31/2021    9:12 AM 12/28/2020    8:57 AM  Advanced Directives  Does Patient Have a Medical Advance Directive? No No No No No  No  Would patient like information on creating a medical advance directive? No - Patient declined No - Patient declined No - Patient declined No - Patient declined No - Patient declined No - Patient declined No - Patient declined    Current Medications (verified) Outpatient Encounter Medications as of 01/24/2022  Medication Sig   tirzepatide (MOUNJARO) 10 MG/0.5ML Pen Inject 10 mg into the skin once a week.   Accu-Chek Softclix Lancets lancets Check blood sugar up to 3 times a day (Patient not taking: Reported on 02/09/2021)   acidophilus (RISAQUAD) CAPS capsule Take 1 capsule by mouth daily.   Alpha-Lipoic Acid 600 MG CAPS Take 600 mg by mouth daily.   amLODipine (NORVASC) 10 MG tablet Take 10 mg by mouth at bedtime.   Blood Glucose Monitoring Suppl (ACCU-CHEK GUIDE ME) w/Device KIT Use to check blood sugar up to 3 times a day (Patient not taking: Reported on 02/09/2021)   carvedilol (COREG) 25 MG tablet TAKE 1 TABLET BY MOUTH TWICE DAILY WITH A MEAL   chlorthalidone (HYGROTON) 25 MG tablet Take 25 mg by mouth daily.   cloNIDine (CATAPRES - DOSED IN MG/24 HR) 0.1 mg/24hr patch 0.1 mg once a week.   Cyanocobalamin (B-12) 500 MCG SUBL Place 500 mcg under the tongue daily.    cyclobenzaprine (FLEXERIL) 5 MG tablet Take 1 tablet (5 mg total) by mouth at bedtime as needed for muscle spasms.   diclofenac Sodium (VOLTAREN) 1  % GEL Apply 2 g topically 4 (four) times daily.   ELIQUIS 5 MG TABS tablet Take 1 tablet by mouth twice daily   famotidine (PEPCID) 20 MG tablet Take 1 tablet (20 mg total) by mouth 2 (two) times daily. (Patient taking differently: Take 20 mg by mouth 2 (two) times daily as needed for heartburn.)   fluticasone (FLONASE) 50 MCG/ACT nasal spray Place 2 sprays into both nostrils daily. (Patient taking differently: Place 2 sprays into both nostrils daily as needed for allergies.)   furosemide (LASIX) 80 MG tablet Take 1 tablet by mouth twice daily (Patient taking differently: Take 80 mg by mouth 2 (two) times daily.)   gabapentin (NEURONTIN) 100 MG capsule Take 1 capsule (100 mg total) by mouth 3 (three) times daily.   glucose blood (ACCU-CHEK GUIDE) test strip Check blood sugar up to 3 times a day (Patient not taking: Reported on 02/09/2021)   hydrALAZINE (APRESOLINE) 100 MG tablet Take 100 mg by mouth 3 (three) times daily.   Insulin Pen Needle (B-D UF III MINI PEN NEEDLES) 31G X 5 MM MISC 1 each by Other route See admin instructions. USE 4-5 TIMES DAILY   Insulin Pen Needle (B-D UF III  MINI PEN NEEDLES) 31G X 5 MM MISC USE 1  FIVE TIMES DAILY   Lancet Devices (ACCU-CHEK SOFTCLIX) lancets Use to check blood sugars 5 times a day. Dx code:E11.9. Insulin dependent. (Patient taking differently: 1 each by Other route See admin instructions. Use to check blood sugars 5 times a day. Dx code:E11.9. Insulin dependent.)   lidocaine (HM LIDOCAINE PATCH) 4 % Place 1 patch onto the skin daily.   lovastatin (MEVACOR) 20 MG tablet Take 1 tablet (20 mg total) by mouth daily.   methocarbamol (ROBAXIN) 500 MG tablet Take 1 tablet (500 mg total) by mouth 3 (three) times daily.   NOVOLOG FLEXPEN 100 UNIT/ML FlexPen Inject 15-30 Units into the skin 3 (three) times daily with meals. Administer 15 units in the morning with breakfast, 25 units with lunch, and 30 units with dinner daily.  ICD Code:  E11.9   oxybutynin (DITROPAN)  5 MG tablet TAKE 1 TABLET BY MOUTH THREE TIMES DAILY   polyethylene glycol (MIRALAX / GLYCOLAX) 17 g packet Take 17 g by mouth 2 (two) times daily. (Patient taking differently: Take 17 g by mouth as needed for mild constipation.)   potassium chloride SA (KLOR-CON) 20 MEQ tablet Take 20 mEq by mouth 2 (two) times daily.   senna-docusate (SENOKOT-S) 8.6-50 MG tablet Take 1 tablet by mouth daily. (Patient taking differently: Take 1 tablet by mouth as needed for mild constipation.)   sildenafil (VIAGRA) 50 MG tablet Take 1-2 tablets (50-100 mg total) by mouth as needed for erectile dysfunction. Take 1 hour before needed. Do not take more than 2 tablets at once.   tamsulosin (FLOMAX) 0.4 MG CAPS capsule Take 1 capsule (0.4 mg total) by mouth at bedtime.   TRESIBA FLEXTOUCH 200 UNIT/ML FlexTouch Pen INJECT 96 UNITS SUBCUTANEOUSLY ONCE DAILY (Patient taking differently: INJECT 48 UNITS SUBCUTANEOUSLY ONCE DAILY)   No facility-administered encounter medications on file as of 01/24/2022.    Allergies (verified) Reglan [metoclopramide], Metformin and related, and Ozempic (0.25 or 0.5 mg-dose) [semaglutide(0.25 or 0.'5mg'$ -dos)]   History: Past Medical History:  Diagnosis Date   Acute venous embolism and thrombosis of deep vessels of proximal lower extremity (HCC) 07/19/2011   Anemia    Anginal pain (HCC)    pt denies   Anxiety    Chest pain, neg MI, normal coronaries by cath 02/18/2013   pt denies   CHF (congestive heart failure) (HCC)    Chronic renal disease, stage IV ([place]) 02/19/2013   Colon polyps    Depression    Diabetic ulcer of right foot ([place])    DVT (deep venous thrombosis) ([place]) 09/2002   patient reports additional DVTs in '06 & '11 (unconfirmed)   ED (erectile dysfunction)    GERD (gastroesophageal reflux disease)    History of blood transfusion    "related to OR" (10/31/2016)   Hyperlipidemia 02/19/2013   Hypertension    Nausea & vomiting    "constant for the last couple weeks"  (10/31/2016)   Nephrotic syndrome    Obesity    BMI 44, weight 346 pounds 01/30/14   OSA (obstructive sleep apnea) 02/28/2018   Mild obstructive sleep apnea with an AHI of 9.8/h but severe during REM sleep with an AHI of 33.8/h.  Oxygen saturations dropped to 86% and there was moderate snoring   Peripheral edema    Pneumonia    Prosthesis adjustment 08/17/2016   Pulmonary embolism ([place]) 09/2002   treated with 6 months of warfarin   Pyelonephritis 02/02/2018   Type  I diabetes mellitus ([place]) dx'd 2001   Urinary retention    Past Surgical History:  Procedure Laterality Date   AMPUTATION Right 12/22/2013   Procedure: AMPUTATION BELOW KNEE;  Surgeon: [name], MD;  Location: [place];  Service: Orthopedics;  Laterality: Right;   AMPUTATION Right 02/19/2014   Procedure: RIGHT ABOVE KNEE AMPUTATION ;  Surgeon: [name], MD;  Location: [place];  Service: Orthopedics;  Laterality: Right;   blood clot removal  2016   CARDIAC CATHETERIZATION  02/18/2013   normal coronaries   ESOPHAGOGASTRODUODENOSCOPY N/A 11/02/2016   Procedure: ESOPHAGOGASTRODUODENOSCOPY (EGD);  Surgeon: [name], MD;  Location: [facility] ENDOSCOPY;  Service: Endoscopy;  Laterality: N/A;   I & D EXTREMITY Right 12/14/2013   Procedure: IRRIGATION AND DEBRIDEMENT RIGHT FOOT;  Surgeon: [name], MD;  Location: [place];  Service: Orthopedics;  Laterality: Right;   INCISION AND DRAINAGE ABSCESS  2007; 2015   "back"   INCISION AND DRAINAGE OF WOUND Right 12/22/2013   Procedure: I&D RIGHT BUTTOCK;  Surgeon: [name], MD;  Location: [place];  Service: Orthopedics;  Laterality: Right;   LEFT HEART CATHETERIZATION WITH CORONARY ANGIOGRAM N/A 02/18/2013   Procedure: LEFT HEART CATHETERIZATION WITH CORONARY ANGIOGRAM;  Surgeon: [name], MD;  Location: [facility] CATH LAB;  Service: Cardiovascular;  Laterality: N/A;   Family History  Problem Relation Age of Onset   Heart disease Mother    Diabetes Mother    Diabetes Maternal Uncle     Diabetes Cousin    Social History   Socioeconomic History   Marital status: Single    Spouse name: Not on file   Number of children: 0   Years of education: 15.5   Highest education level: Some college, no degree  Occupational History   Occupation: Ship broker    Comment: [employer]   Occupation: disability  Tobacco Use   Smoking status: Never   Smokeless tobacco: Never  Vaping Use   Vaping Use: Never used  Substance and Sexual Activity   Alcohol use: Yes    Comment: 10/31/2016 "a few beers q 6 months or so"   Drug use: No   Sexual activity: Yes  Other Topics Concern   Not on file  Social History Narrative   Financial assistance approved for 100% discount at [facility] and has [facility] card per [employer]   09/01/2009.      Lives in [place] with mother and sister.            Social Determinants of Health   Financial Resource Strain: Medium Risk (10/01/2020)   Overall Financial Resource Strain (CARDIA)    Difficulty of Paying Living Expenses: Somewhat hard  Food Insecurity: No Food Insecurity (01/24/2022)   Hunger Vital Sign    Worried About Running Out of Food in the Last Year: Never true    Ran Out of Food in the Last Year: Never true  Transportation Needs: No Transportation Needs (01/24/2022)   PRAPARE - Hydrologist (Medical): No    Lack of Transportation (Non-Medical): No  Physical Activity: Sufficiently Active (08/20/2019)   Exercise Vital Sign    Days of Exercise per Week: 6 days    Minutes of Exercise per Session: 60 min  Stress: No Stress Concern Present (08/20/2019)   [place]    Feeling of Stress : Not at all  Social Connections: Socially Isolated (12/22/2021)   Social Connection and Isolation Panel [NHANES]  Frequency of Communication with Friends and Family: More than three times a week    Frequency of Social Gatherings with Friends and Family: More than three times a  week    Attends Religious Services: Never    Marine scientist or Organizations: No    Attends Music therapist: Never    Marital Status: Never married    Tobacco Counseling Counseling given: Not Answered   Clinical Intake:  Pre-visit preparation completed: Yes  Pain : Faces Faces Pain Scale: No hurt  Faces Pain Scale: No hurt  BMI - recorded: 44.86 Nutritional Status: BMI > 30  Obese Nutritional Risks: None Diabetes: Yes CBG done?: No Did pt. bring in CBG monitor from home?: No  How often do you need to have someone help you when you read instructions, pamphlets, or other written materials from your doctor or pharmacy?: 1 - Never  Diabetic?yes-followed by Dr Dwyane Dee   Interpreter Needed?: No  Information entered by :: [email or web address]   Activities of Daily Living    01/24/2022    4:46 PM 01/24/2022    2:51 PM  In your present state of health, do you have any difficulty performing the following activities:  Hearing? 0 0  Vision? 0 0  Difficulty concentrating or making decisions? 0 0  Walking or climbing stairs? 1 1  Dressing or bathing? 0 0  Doing errands, shopping? 0 0    Patient Care Team: Starlyn Skeans, MD as PCP - General Sueanne Margarita, MD as PCP - Cardiology (Cardiology) Sueanne Margarita, MD as Consulting Physician (Cardiology) [name], MD as Consulting Physician (Endocrinology) Rexene Agent, MD as Attending Physician (Nephrology)  Indicate any recent Medical Services you may have received from other than [facility] providers in the past year (date may be approximate).     Assessment:   This is a routine wellness examination for [place].  Hearing/Vision screen No results found.  Dietary issues and exercise activities discussed:     Goals Addressed   None   Depression Screen    01/24/2022    4:35 PM 01/24/2022    2:54 PM 12/22/2021    9:07 AM 09/15/2021    3:04 PM 01/31/2021   10:08 AM 12/28/2020    8:59 AM 09/23/2020    9:45  AM  PHQ 2/9 Scores  PHQ - 2 Score 0 0 0 0 0 0 0  PHQ- 9 Score 0 0 0 0 0 1     Fall Risk    01/24/2022    2:51 PM 12/22/2021    9:07 AM 09/15/2021    3:04 PM 01/31/2021    9:03 AM 12/28/2020    9:10 AM  [place] in the past year? 0 0 0 0   Number falls in past yr: 0  0 0   Injury with Fall? 0  0 0   Risk for fall due to : No Fall Risks Impaired balance/gait;Impaired mobility Impaired balance/gait;Impaired mobility Impaired balance/gait;Impaired mobility Other (Comment)  Risk for fall due to: Comment     RIGHT AKA  Follow up Falls evaluation completed Falls evaluation completed Falls evaluation completed Falls evaluation completed;Falls prevention discussed Falls evaluation completed;Education provided    FALL RISK PREVENTION PERTAINING TO THE HOME:  Any stairs in or around the home? Yes  If so, are there any without handrails? No  Home free of loose throw rugs in walkways, pet beds, electrical cords, etc? Yes  Adequate lighting in your home  to reduce risk of falls? Yes   ASSISTIVE DEVICES UTILIZED TO PREVENT FALLS:  Life alert? No  Use of a cane, walker or w/c? Yes  Grab bars in the bathroom? Yes  Shower chair or bench in shower? Yes  Elevated toilet seat or a handicapped toilet? Yes   TIMED UP AND GO:  Was the test performed? No .  Length of time to ambulate 10 feet: n/a sec.    Cognitive Function:    12/11/2017   10:26 AM  MMSE - Mini Mental State Exam  Not completed: Unable to complete        Immunizations Immunization History  Administered Date(s) Administered   Influenza Split 12/29/2010, 02/12/2012   Influenza Whole 12/28/2008   Influenza,inj,Quad PF,6+ Mos 12/24/2012, 12/10/2013, 11/01/2016, 09/24/2017, 09/24/2019, 09/23/2020, 09/15/2021   PNEUMOCOCCAL CONJUGATE-20 12/28/2020   Pneumococcal Polysaccharide-23 07/29/2009   Tdap 08/14/2017    TDAP status: Up to date  Flu Vaccine status: Up to date  Pneumococcal vaccine status: Up to  date  Covid-19 vaccine status: Information provided on how to obtain vaccines.   Qualifies for Shingles Vaccine? No   Zostavax completed No   Shingrix Completed?: No.    Education has been provided regarding the importance of this vaccine. Patient has been advised to call insurance company to determine out of pocket expense if they have not yet received this vaccine. Advised may also receive vaccine at local pharmacy or Health Dept. Verbalized acceptance and understanding.  Screening Tests Health Maintenance  Topic Date Due   COVID-19 Vaccine (1) Never done   Hepatitis C Screening  Never done   Diabetic kidney evaluation - Urine ACR  09/20/2019   OPHTHALMOLOGY EXAM  06/15/2021   FOOT EXAM  04/10/2022 (Originally 04/25/2018)   HEMOGLOBIN A1C  03/31/2022   Diabetic kidney evaluation - eGFR measurement  01/11/2023   Medicare Annual Wellness (AWV)  01/25/2023   COLONOSCOPY (Pts 45-70yr Insurance coverage will need to be confirmed)  08/02/2025   DTaP/Tdap/Td (2 - Td or Tdap) 08/15/2027   INFLUENZA VACCINE  Completed   HIV Screening  Completed   HPV VACCINES  Aged Out    Health Maintenance  Health Maintenance Due  Topic Date Due   COVID-19 Vaccine (1) Never done   Hepatitis C Screening  Never done   Diabetic kidney evaluation - Urine ACR  09/20/2019   OPHTHALMOLOGY EXAM  06/15/2021    Colorectal cancer screening: Type of screening: Colonoscopy. Completed 08/03/2015. Repeat every 10 years  Lung Cancer Screening: (Low Dose CT Chest recommended if Age 46-80years, 30 pack-year currently smoking OR have quit w/in 15years.) does not qualify.   Lung Cancer Screening Referral: n/a  Additional Screening:  Hepatitis C Screening: does qualify; Completed defer to pcp  Vision Screening: Recommended annual ophthalmology exams for early detection of glaucoma and other disorders of the eye. Is the patient up to date with their annual eye exam?  Yes  Who is the provider or what is the name  of the office in which the patient attends annual eye exams?[facility] If pt is not established with a provider, would they like to be referred to a provider to establish care?  N/a .   Dental Screening: Recommended annual dental exams for proper oral hygiene  Community Resource Referral / Chronic Care Management: CRR required this visit?  No   CCM required this visit?  No      Plan:     I have personally reviewed and noted the following  in the patient's chart:   Medical and social history Use of alcohol, tobacco or illicit drugs  Current medications and supplements including opioid prescriptions. Patient is not currently taking opioid prescriptions. Functional ability and status Nutritional status Physical activity Advanced directives List of other physicians Hospitalizations, surgeries, and ER visits in previous 12 months Vitals Screenings to include cognitive, depression, and falls Referrals and appointments  In addition, I have reviewed and discussed with patient certain preventive protocols, quality metrics, and best practice recommendations. A written personalized care plan for preventive services as well as general preventive health recommendations were provided to patient.     Nicoletta Dress, [place]   01/24/2022   Nurse Notes: face to face   [name] , Thank you for taking time to come for your Medicare Wellness Visit. I appreciate your ongoing commitment to your health goals. Please review the following plan we discussed and let me know if I can assist you in the future.   These are the goals we discussed:  Goals       " I want to get my BMI under 35 so I am eligible for renal transplant should I need it." (pt-stated)      CARE PLAN ENTRY (see longitudinal plan of care for additional care plan information)  Current Barriers:  Knowledge Deficits related to weight loss strategies as evidenced by weight gain reported/observed and/or altered eating  habits Chronic Disease Management support and education needs related to weight management in patient with CHF, HTN, HLD, DMII, and CKD Stage 3, s/p right AKA Care Coordination needs related to assisting patient  with weight loss in a patient with IDDM, HF, HLD, HTN, CKD, s/p R AKA- spoke with patient says he has an appointment with [employer] this week for prosthetic sleeve fitting this week - this is needed due to his weight loss  Nurse Case Manager Clinical Goal(s):  Over the next 30-60 days, patient will verbalize basic understanding of plan for initiation of weight loss strategies Over the next 30-60  days, patient will demonstrate improved health management independence as evidenced by exercising as advised by provider (describe) 3-6 times weekly and following heart healthy diet per his clinic dietician Over the next 30-60 days, patient will work with health care team and fitness team to address needs related to ongoing weight loss  Interventions:  Evaluation of current treatment plan related to weight loss and patient's adherence to plan as established by provider Provided verbal and/or written education to patient re: recommended life style changes: avoid fad diets, make small/incremental dietary and exercise changes, eat at the table and avoid eating in front of the TV, plan management of cravings, monitor snacking and cravings in food diary Discussed plans with patient for ongoing care management follow up and provided patient with direct contact information for care management team Reviewed scheduled/upcoming provider appointments including: [employer] for prosthetic sleeve fitting next week per patient  Patient Self Care Activities:  Patient verbalizes understanding of plan to work with health care team and fitness team to achieve desired weight loss to meet goal of BMI<35 Self administers medications as prescribed Attends all scheduled provider appointments Calls pharmacy for medication  refills Calls provider office for new concerns or questions Unable to independently meet weight loss goal of BMI <35  Please see past updates related to this goal by clicking on the "Past Updates" button in the selected goal        Blood Pressure < 140/90  BP Readings from Last 3 Encounters:  11/30/20 (!) 151/65  11/09/20 135/74  11/05/20 (!) 142/76    Not meeting blood pressure targets      HEMOGLOBIN A1C < 7       Lab Results  Component Value Date   HGBA1C 4.7 (L) 10/02/2020   HGBA1C 5.5 07/08/2020   HGBA1C 6.0 04/15/2020    Meeting Hgb A1C target       LDL CALC < 100       Lab Results  Component Value Date   CHOL 105 10/26/2020   HDL 37.40 (L) 10/26/2020   LDLCALC 52 10/26/2020   TRIG 81.0 10/26/2020   CHOLHDL 3 10/26/2020     Meeting lipid targets      Monitor and Manage My Blood Sugar-Diabetes Type 2      Timeframe:  Long-Range Goal Priority:  High Start Date:       12/15/19                      Expected End Date:   ongoing            - check blood sugar at prescribed times - check blood sugar before and after exercise - check blood sugar if I feel it is too high or too low - enter blood sugar readings and medication or insulin into daily log - take the blood sugar meter to all doctor visits            This is a list of the screening recommended for you and due dates:  Health Maintenance  Topic Date Due   COVID-19 Vaccine (1) Never done   Hepatitis C Screening: USPSTF Recommendation to screen - Ages 58-79 yo.  Never done   Yearly kidney health urinalysis for diabetes  09/20/2019   Eye exam for diabetics  06/15/2021   Complete foot exam   04/10/2022*   Hemoglobin A1C  03/31/2022   Yearly kidney function blood test for diabetes  01/11/2023   Medicare Annual Wellness Visit  01/25/2023   Colon Cancer Screening  08/02/2025   DTaP/Tdap/Td vaccine (2 - Td or Tdap) 08/15/2027   Flu Shot  Completed   HIV Screening  Completed   HPV Vaccine  Aged Out   *Topic was postponed. The date shown is not the original due date.

## 2022-01-24 NOTE — Assessment & Plan Note (Addendum)
Patient reports hx of diabetic neuropathy which he is taking gabapentin. Requesting refill today. Tolerating medication without any reported side effects. Patient declined foot exam today, willing to complete at next visit. He regularly checks his left foot for any changes.   Plan -refilled gabapentin 100 mg TID -patient declined foot exam, will assess at next visit

## 2022-01-24 NOTE — Assessment & Plan Note (Signed)
BP Readings from Last 3 Encounters:  01/24/22 132/71  01/19/22 (!) 162/82  01/10/22 139/78   BP well-controlled today on Coreg 25 mg BID, amlodipine 10 mg and hydralazine 100 mg TID. He follows with [facility].  Plan -Continue Coreg, amlodipine and hydralazine

## 2022-01-24 NOTE — Telephone Encounter (Signed)
Refill request on gabapentin (NEURONTIN) 100 MG capsule @ [place] ([place]), [place] - [address]

## 2022-01-26 ENCOUNTER — Ambulatory Visit: Payer: Medicare Other | Admitting: Physical Therapy

## 2022-01-26 ENCOUNTER — Encounter: Payer: Self-pay | Admitting: Physical Therapy

## 2022-01-26 VITALS — BP 138/73 | HR 83

## 2022-01-26 DIAGNOSIS — M6281 Muscle weakness (generalized): Secondary | ICD-10-CM

## 2022-01-26 DIAGNOSIS — R2681 Unsteadiness on feet: Secondary | ICD-10-CM | POA: Diagnosis not present

## 2022-01-26 DIAGNOSIS — R2689 Other abnormalities of gait and mobility: Secondary | ICD-10-CM

## 2022-01-26 NOTE — Therapy (Signed)
OUTPATIENT PHYSICAL THERAPY PROSTHETICS TREATMENT   Patient Name: [name] MRN: [id number] DOB:06-22-1975, 47 y.o., male Today's Date: 01/26/2022  PCP: Starlyn Skeans, MD REFERRING PROVIDER: Charise Killian, MD  END OF SESSION:  PT End of Session - 01/26/22 0933     Visit Number 7    Number of Visits 17    Date for PT Re-Evaluation 02/24/22    Authorization Type MEDICARE PART A AND B    PT Start Time 0937    PT Stop Time 1015    PT Time Calculation (min) 38 min    Equipment Utilized During Treatment Gait belt    Activity Tolerance Patient tolerated treatment well    Behavior During Therapy WFL for tasks assessed/performed              Past Medical History:  Diagnosis Date   Acute venous embolism and thrombosis of deep vessels of proximal lower extremity ([place]) 07/19/2011   Anemia    Anginal pain ([place])    pt denies   Anxiety    Chest pain, neg MI, normal coronaries by cath 02/18/2013   pt denies   CHF (congestive heart failure) ([place])    Chronic renal disease, stage IV ([place]) 02/19/2013   Colon polyps    Depression    Diabetic ulcer of right foot ([place])    DVT (deep venous thrombosis) ([place]) 09/2002   patient reports additional DVTs in '06 & '11 (unconfirmed)   ED (erectile dysfunction)    GERD (gastroesophageal reflux disease)    History of blood transfusion    "related to OR" (10/31/2016)   Hyperlipidemia 02/19/2013   Hypertension    Nausea & vomiting    "constant for the last couple weeks" (10/31/2016)   Nephrotic syndrome    Obesity    BMI 44, weight 346 pounds 01/30/14   OSA (obstructive sleep apnea) 02/28/2018   Mild obstructive sleep apnea with an AHI of 9.8/h but severe during REM sleep with an AHI of 33.8/h.  Oxygen saturations dropped to 86% and there was moderate snoring   Peripheral edema    Pneumonia    Prosthesis adjustment 08/17/2016   Pulmonary embolism ([place]) 09/2002   treated with 6 months of warfarin   Pyelonephritis 02/02/2018   Type I  diabetes mellitus ([place]) dx'd 2001   Urinary retention    Past Surgical History:  Procedure Laterality Date   AMPUTATION Right 12/22/2013   Procedure: AMPUTATION BELOW KNEE;  Surgeon: [name], MD;  Location: [facility];  Service: Orthopedics;  Laterality: Right;   AMPUTATION Right 02/19/2014   Procedure: RIGHT ABOVE KNEE AMPUTATION ;  Surgeon: [name], MD;  Location: [facility];  Service: Orthopedics;  Laterality: Right;   blood clot removal  2016   CARDIAC CATHETERIZATION  02/18/2013   normal coronaries   ESOPHAGOGASTRODUODENOSCOPY N/A 11/02/2016   Procedure: ESOPHAGOGASTRODUODENOSCOPY (EGD);  Surgeon: [name], MD;  Location[facility] ENDOSCOPY;  Service: Endoscopy;  Laterality: N/A;   I & D EXTREMITY Right 12/14/2013   Procedure: IRRIGATION AND DEBRIDEMENT RIGHT FOOT;  Surgeon: [name], MD;  Location: [facility];  Service: Orthopedics;  Laterality: Right;   INCISION AND DRAINAGE ABSCESS  2007; 2015   "back"   INCISION AND DRAINAGE OF WOUND Right 12/22/2013   Procedure: I&D RIGHT BUTTOCK;  Surgeon: [name], MD;  Location: [facility];  Service: Orthopedics;  Laterality: Right;   LEFT HEART CATHETERIZATION WITH CORONARY ANGIOGRAM N/A 02/18/2013   Procedure: LEFT HEART CATHETERIZATION WITH CORONARY ANGIOGRAM;  Surgeon: [name], MD;  Location: [facility] CATH LAB;  Service: Cardiovascular;  Laterality: N/A;   Patient Active Problem List   Diagnosis Date Noted   Diabetic neuropathy ([place]) 01/24/2022   Trapezius strain, left, initial encounter 02/02/2021   Left shoulder pain 12/30/2020   Acute pulmonary edema ([facility])    Benign paroxysmal positional vertigo 09/14/2020   Anemia associated with chronic renal failure    Hemoglobinuria 12/12/2018   OSA (obstructive sleep apnea) 02/28/2018   Chronic diastolic heart failure ([place]) 02/02/2018   Preventative health care 08/15/2017   Erectile dysfunction 04/22/2017   History of pulmonary embolus (PE) 06/09/2016   Depression 05/04/2016   Hypogonadism  in male 05/04/2016   Urinary retention 05/04/2016   Dyslipidemia 08/12/2014   Unilateral AKA, right ([place]) 12/26/2013   Hyperlipidemia 02/19/2013   S/P cardiac cath, 02/18/13, normal coronaries 02/19/2013   Chronic renal insufficiency, stage 3 (moderate) ([place]) 02/19/2013   Hypertension 07/24/2011   Long term current use of anticoagulant therapy 07/19/2011   History of DVT (deep vein thrombosis) 07/28/2009   Morbid obesity ([place]) 10/26/2005   Insulin dependent type 2 diabetes mellitus, controlled ([place]) 01/09/2002    ONSET DATE: 09/15/2021 (referral date)  REFERRING DIAG: F64.332R (ICD-10-CM) - Unilateral AKA, right ([place])  THERAPY DIAG:  Other abnormalities of gait and mobility  Muscle weakness (generalized)  Unsteadiness on feet  Rationale for Evaluation and Treatment: Rehabilitation  SUBJECTIVE:   SUBJECTIVE STATEMENT: Treatment time shortened from 45 to 41mnutes as patient needed to go to the bathroom after being pulled back from waiting room. Patient informed that he is always welcome to go before session as he arrived early and patient responds that he just now needed to go.   Denies changes to medication/falls. Reports no L hip pain today.   Pt accompanied by: self-came via transportation  PERTINENT HISTORY: Insulin dependent DM2, hyperlipidemia, right AKA, chronic diastolic heart failure, HTN, BPPV  PAIN:  Are you having pain? No  PRECAUTIONS: Fall  WEIGHT BEARING RESTRICTIONS: No  FALLS: Has patient fallen in last 6 months? No  LIVING ENVIRONMENT: Lives with: lives alone Lives in: House/apartment Home Access: Level entry Home layout: One level Stairs: No Has following equipment at home: QLobbyist WEnvironmental consultant- 2 wheeled, Wheelchair (manual), SElectronics engineer and Grab bars  PLOF: Independent with household mobility with device, Independent with community mobility with device, Independent with transfers, and Needs assistance with homemaking  PATIENT  GOALS: "To learn how to walk with this (gestures to prosthetic) effectively, build my endurance and get out of this chair!"  OBJECTIVE:  Vitals:   01/26/22 0939  BP: 138/73  Pulse: 83   Blood sugar was 113 at 9:38am.  DIAGNOSTIC FINDINGS: No recent relevant imaging.1  FUNCTIONAL TESTS:  5 times sit to stand: 16.32" sec w/ BUE support in // bars; '1\' 32"'$  with RUE support in // bars 2 minute walk test: 60 feet 2/10 RPE at start 4/10 RPE at end with use of FWW + SBA and wheel chair follow, x3 standing rest breaks <10" 10 meter walk test: 56" or 0.17 m/s with FWW + SBA Berg Balance Scale: Not tested due to patients's current functional balance  CURRENT PROSTHETIC WEAR ASSESSMENT: Patient is independent with: skin check, residual limb care, care of non-amputated limb, prosthetic cleaning, ply sock cleaning, correct ply sock adjustment, proper wear schedule/adjustment, and proper weight-bearing schedule/adjustment Patient is dependent with:  N/A Donning prosthesis: Complete Independence Doffing prosthesis: Complete Independence Prosthetic wear tolerance: 4-8 hours/day,  7 days/week Prosthetic weight bearing tolerance: roughly 5 minutes Edema: pt reports stable limb edema Residual limb condition: Pt reports no open wounds, prefers to not doff liner. Prosthetic description: TF lanyard w/ [place] HD 4-bar mechanical knee (w/ manual lock) - pt states knee is hydraulic K code/activity level with prosthetic use: Level 2   TODAY'S TREATMENT:                                                                                                                                          Therex:  Initiated session on stepper with hill climbs for aerobic conditioning/LE strength and AROM (baseline at lvl 5) with LE only x 10 minutes (removed seat and pulled [place] with wheelchair brakes and tippers in place) (RPE: 4/10 at end)  Transfer to low mat with scooting and adequate wheel chair clearance to R both on/off  mat without AD and SBA  - Seated gastroc stretch with strap 3 x 30" to LLE (emphasis on reducing inversion during stretch) - Prone Hip extension with double towel roll for hip flexor stretch to RLE x 5 min with prosthetic doffed - Prone Hip flexor stretch with strap 3 x 30" to RLE  Gait overground with [place] follow: Ambulation with 2WW + SBA 1 x 87 feet for 2MWT and education on not dragging LLE/safety on turns   PATIENT EDUCATION: Education details: Continue HEP Person educated: Patient Education method: Theatre stage manager Education comprehension: verbalized understanding  HOME EXERCISE PROGRAM: Use of FWW + prosthetic leg with sit to stands during gym sessions in mornings  Printed for patient: -STS (use counter support if needed vs RW) -Standing march (kicking RLE straight) -Standing wide stance w/ counter support progressing to no UE support (use chair behind) *PT forgot to save to patient instructions following printing.  URL: [email or web address] Date: 01/19/2022 Prepared by: Malachi Carl  Exercises - Sidelying Hip Abduction (AKA)  - 1 x daily - 7 x weekly - 3 sets - 10 reps - Prone Hip Extension with Residual Limb (AKA)  - 1 x daily - 7 x weekly - 3 sets - 10 reps - Supine Bridge with Bilateral LE and Bolster for RLE (BKA)  - 1 x daily - 7 x weekly - 3 sets - 10 reps  Access Code: [email or web address] URL: [email or web address] Date: 01/26/2022 Prepared by: Malachi Carl  Exercises - Seated Calf Stretch with Strap  - 1 x daily - 7 x weekly - 3 sets - 30" hold - Prone Lying with Towel Roll (Hip Flexor Stretch) (BKA)  - 1 x daily - 7 x weekly - 1 sets - 5' hold - Prone Quadriceps Stretch with Strap  - 1 x daily - 7 x weekly - 3 sets - 30" hold   ASSESSMENT:  CLINICAL IMPRESSION: Initiated session with hill climbs on [employer] stepper for LE strengthening and improved activity tolerance. Patient tolerated well reporting RPE  of 4/10. Patient increased  ambulation distance from 60 feet to 87 feet compared to last session during 2MWT with no standing breaks. Ended session with stretching to improve ROM to improve deficits in gait. He continues to benefit from skilled PT to further address safety with functional mobility and to progress tolerance to general activity and ambulatory distance. Will continue POC.  OBJECTIVE IMPAIRMENTS: Abnormal gait, decreased activity tolerance, decreased balance, decreased endurance, decreased mobility, difficulty walking, decreased ROM, decreased strength, improper body mechanics, prosthetic dependency , and obesity.   ACTIVITY LIMITATIONS: carrying, lifting, bending, standing, squatting, stairs, transfers, and locomotion level  PARTICIPATION LIMITATIONS: meal prep, cleaning, laundry, and driving  PERSONAL FACTORS: Fitness, Time since onset of injury/illness/exacerbation, Transportation, and 1-2 comorbidities: insulin dependent DM2 and HTN  are also affecting patient's functional outcome.   REHAB POTENTIAL: Good  CLINICAL DECISION MAKING: Evolving/moderate complexity  EVALUATION COMPLEXITY: Moderate   GOALS: Goals reviewed with patient? Yes  SHORT TERM GOALS: Target date: 01/27/2022  Pt will be independent with strength and balance HEP to maintain progress. Baseline: Provided 01/19/2022; Met on 01/24/2022 Goal status: MET  2.  Pt will decrease 5xSTS to </=13 seconds w/ use of only one hand on walker for support in order to demonstrate decreased risk for falls and improved functional bilateral LE strength and power. Baseline: 17.31 sec w/ BUE on RW; 01/24/2022 16.12 sec with BU in //; ' 32" with RUE support in // bars Goal status: IN PROGRESS  3. Patient will improve 2MWT score to 35 meters to demonstrate improvement in aerobic capacity and endurance needed for ambulating in the community.   Baseline: 28.8 m with SBA with RW and wheel chair follow; 18 meters with SBA w/ RW but increased soreness impacting  patient this session 01/24/2022 Goal status: REVISED  4.  Patient will improve gait speed to 0.35 m/s or greater with LRAD to indicate an improvement from being a household ambulator to limited community ambulator.   Baseline: 0.26 m/s with RW + SBA; 0.17 m/s with RW + SBA Goal status: REVISED  5.  Patient will demonstrate ability to stand unsupported with wide base gait for 2 minutes without LOB to improve static stability needed for safety within home setting. Baseline: Discontinued Berg as not appropriate for patient at this time. Revised goal to standing balance/component Berg goal; initial on 01/05/2022 5-52" trials with feet approximately 1.5 feet apart; improved to 62" on 01/24/2022 Goal status: REVISED  LONG TERM GOALS: Target date: 02/24/2022  Pt will be independent with finalized strengthening HEP for use in the gym at to maintain progress at home. Baseline: To be established. Goal status: INITIAL  2.   Patient will improve 2MWT score to 80 meters to demonstrate improvement in aerobic capacity and endurance needed for ambulating in the community.   Baseline: 28.8 m with SBA with RW and wheel chair follow; 18 meters with SBA w/ RW but increased soreness impacting patient this session 01/24/2022 Goal status: INITIAL  3. Patient will improve gait speed to 0.6 m/s to indicate improvement to the level of limited community ambulator in order to participate more easily in activities outside of the home.    Baseline:  0.26 m/s with RW + SBA;  0.17 m/s with RW + SBA Goal status: REVISED  4.  Patient will demonstrate ability to stand unsupported with feet less than 8 cm apart for 2 minutes without LOB to improve static stability needed for safety within home setting. Baseline: Discontinued Berg as not appropriate  for patient at this time. Revised goal to standing balance/component Berg goal; initial on 01/05/2022 5-52" trials with feet approximately 1.5 feet apart; improved to 62" on  01/24/2022 Goal status: REVISED  PLAN:  PT FREQUENCY: 2x/week  PT DURATION: 8 weeks  PLANNED INTERVENTIONS: Therapeutic exercises, Therapeutic activity, Neuromuscular re-education, Balance training, Gait training, Patient/Family education, Self Care, Joint mobilization, Stair training, Vestibular training, Prosthetic training, DME instructions, Manual therapy, and Re-evaluation  PLAN FOR NEXT SESSION recumbent bike, pt wants to work towards elliptical-edu on safe and realistic goals as pt progresses, pt would like to work towards treadmill-overground tolerance and working on step up efficiency to access the treadmill would likely be best initially?, leg press-pt request, upright posture, standing balance, endurance, core strength, med ball slams, modified SLS tasks  Malachi Carl, PT, DPT 01/26/2022, 12:06 PM

## 2022-01-26 NOTE — Progress Notes (Signed)
Internal Medicine Clinic Attending  Case discussed with Dr. Zheng  At the time of the visit.  We reviewed the resident's history and exam and pertinent patient test results.  I agree with the assessment, diagnosis, and plan of care documented in the resident's note.  

## 2022-01-31 ENCOUNTER — Ambulatory Visit: Payer: Medicare Other | Admitting: Physical Therapy

## 2022-01-31 ENCOUNTER — Encounter (HOSPITAL_COMMUNITY)
Admission: RE | Admit: 2022-01-31 | Discharge: 2022-01-31 | Disposition: A | Discharge status: Home / Self Care | Payer: Medicare Other | Source: Ambulatory Visit | Attending: Nephrology | Admitting: Nephrology

## 2022-01-31 VITALS — BP 128/78

## 2022-01-31 VITALS — BP 135/72 | HR 82 | Temp 97.5°F | Resp 17

## 2022-01-31 DIAGNOSIS — R2681 Unsteadiness on feet: Secondary | ICD-10-CM

## 2022-01-31 DIAGNOSIS — N183 Chronic kidney disease, stage 3 unspecified: Secondary | ICD-10-CM | POA: Diagnosis not present

## 2022-01-31 DIAGNOSIS — R2689 Other abnormalities of gait and mobility: Secondary | ICD-10-CM

## 2022-01-31 DIAGNOSIS — M6281 Muscle weakness (generalized): Secondary | ICD-10-CM

## 2022-01-31 LAB — IRON AND TIBC
Iron: 55 ug/dL (ref 45–182)
Saturation Ratios: 19 % (ref 17.9–39.5)
TIBC: 294 ug/dL (ref 250–450)
UIBC: 239 ug/dL

## 2022-01-31 LAB — FERRITIN: Ferritin: 290 ng/mL (ref 24–336)

## 2022-01-31 LAB — RENAL FUNCTION PANEL
Albumin: 3.5 g/dL (ref 3.5–5.0)
Anion gap: 10 (ref 5–15)
BUN: 82 mg/dL — ABNORMAL HIGH (ref 6–20)
CO2: 23 mmol/L (ref 22–32)
Calcium: 8.9 mg/dL (ref 8.9–10.3)
Chloride: 102 mmol/L (ref 98–111)
Creatinine, Ser: 3.73 mg/dL — ABNORMAL HIGH (ref 0.61–1.24)
GFR, Estimated: 19 mL/min — ABNORMAL LOW (ref >60)
Glucose, Bld: 84 mg/dL (ref 70–99)
Phosphorus: 4.7 mg/dL — ABNORMAL HIGH (ref 2.5–4.6)
Potassium: 3.5 mmol/L (ref 3.5–5.1)
Sodium: 135 mmol/L (ref 135–145)

## 2022-01-31 LAB — POCT HEMOGLOBIN-HEMACUE: Hemoglobin: 11.9 g/dL — ABNORMAL LOW (ref 13.0–17.0)

## 2022-01-31 MED ORDER — DARBEPOETIN ALFA 200 MCG/0.4ML IJ SOSY
PREFILLED_SYRINGE | INTRAMUSCULAR | Status: AC
  Filled 2022-01-31: qty 0.4

## 2022-01-31 MED ORDER — DARBEPOETIN ALFA 200 MCG/0.4ML IJ SOSY
200.0000 ug | PREFILLED_SYRINGE | INTRAMUSCULAR | Status: DC
  Administered 2022-01-31: 200 ug via SUBCUTANEOUS

## 2022-01-31 NOTE — Therapy (Signed)
OUTPATIENT PHYSICAL THERAPY PROSTHETICS TREATMENT   Patient Name: [name] MRN: [id number] DOB:1975/08/02, 47 y.o., male Today's Date: 01/31/2022  PCP: Starlyn Skeans, MD REFERRING PROVIDER: Charise Killian, MD  END OF SESSION:  PT End of Session - 01/31/22 1452     Visit Number 8    Number of Visits 17    Date for PT Re-Evaluation 02/24/22    Authorization Type MEDICARE PART A AND B    PT Start Time 1447    PT Stop Time 1530    PT Time Calculation (min) 43 min    Equipment Utilized During Treatment Gait belt    Activity Tolerance Treatment limited secondary to medical complications (Comment)    Behavior During Therapy [facility] for tasks assessed/performed              Past Medical History:  Diagnosis Date   Acute venous embolism and thrombosis of deep vessels of proximal lower extremity (HCC) 07/19/2011   Anemia    Anginal pain (HCC)    pt denies   Anxiety    Chest pain, neg MI, normal coronaries by cath 02/18/2013   pt denies   CHF (congestive heart failure) (HCC)    Chronic renal disease, stage IV ([place]) 02/19/2013   Colon polyps    Depression    Diabetic ulcer of right foot ([place])    DVT (deep venous thrombosis) ([place]) 09/2002   patient reports additional DVTs in '06 & '11 (unconfirmed)   ED (erectile dysfunction)    GERD (gastroesophageal reflux disease)    History of blood transfusion    "related to OR" (10/31/2016)   Hyperlipidemia 02/19/2013   Hypertension    Nausea & vomiting    "constant for the last couple weeks" (10/31/2016)   Nephrotic syndrome    Obesity    BMI 44, weight 346 pounds 01/30/14   OSA (obstructive sleep apnea) 02/28/2018   Mild obstructive sleep apnea with an AHI of 9.8/h but severe during REM sleep with an AHI of 33.8/h.  Oxygen saturations dropped to 86% and there was moderate snoring   Peripheral edema    Pneumonia    Prosthesis adjustment 08/17/2016   Pulmonary embolism ([place]) 09/2002   treated with 6 months of warfarin    Pyelonephritis 02/02/2018   Type I diabetes mellitus ([place]) dx'd 2001   Urinary retention    Past Surgical History:  Procedure Laterality Date   AMPUTATION Right 12/22/2013   Procedure: AMPUTATION BELOW KNEE;  Surgeon: [name], MD;  Location: [place];  Service: Orthopedics;  Laterality: Right;   AMPUTATION Right 02/19/2014   Procedure: RIGHT ABOVE KNEE AMPUTATION ;  Surgeon: [name], MD;  Location: [place];  Service: Orthopedics;  Laterality: Right;   blood clot removal  2016   CARDIAC CATHETERIZATION  02/18/2013   normal coronaries   ESOPHAGOGASTRODUODENOSCOPY N/A 11/02/2016   Procedure: ESOPHAGOGASTRODUODENOSCOPY (EGD);  Surgeon: [name], MD;  Location: [facility] ENDOSCOPY;  Service: Endoscopy;  Laterality: N/A;   I & D EXTREMITY Right 12/14/2013   Procedure: IRRIGATION AND DEBRIDEMENT RIGHT FOOT;  Surgeon: [name], MD;  Location: [place];  Service: Orthopedics;  Laterality: Right;   INCISION AND DRAINAGE ABSCESS  2007; 2015   "back"   INCISION AND DRAINAGE OF WOUND Right 12/22/2013   Procedure: I&D RIGHT BUTTOCK;  Surgeon: [name], MD;  Location: [place];  Service: Orthopedics;  Laterality: Right;   LEFT HEART CATHETERIZATION WITH CORONARY ANGIOGRAM N/A 02/18/2013   Procedure: LEFT HEART CATHETERIZATION WITH  CORONARY ANGIOGRAM;  Surgeon: [name], MD;  Location: [facility] CATH LAB;  Service: Cardiovascular;  Laterality: N/A;   Patient Active Problem List   Diagnosis Date Noted   Diabetic neuropathy ([place]) 01/24/2022   Trapezius strain, left, initial encounter 02/02/2021   Left shoulder pain 12/30/2020   Acute pulmonary edema (HCC)    Benign paroxysmal positional vertigo 09/14/2020   Anemia associated with chronic renal failure    Hemoglobinuria 12/12/2018   OSA (obstructive sleep apnea) 02/28/2018   Chronic diastolic heart failure ([place]) 02/02/2018   Preventative health care 08/15/2017   Erectile dysfunction 04/22/2017   History of pulmonary embolus (PE) 06/09/2016    Depression 05/04/2016   Hypogonadism in male 05/04/2016   Urinary retention 05/04/2016   Dyslipidemia 08/12/2014   Unilateral AKA, right ([place]) 12/26/2013   Hyperlipidemia 02/19/2013   S/P cardiac cath, 02/18/13, normal coronaries 02/19/2013   Chronic renal insufficiency, stage 3 (moderate) ([place]) 02/19/2013   Hypertension 07/24/2011   Long term current use of anticoagulant therapy 07/19/2011   History of DVT (deep vein thrombosis) 07/28/2009   Morbid obesity ([place]) 10/26/2005   Insulin dependent type 2 diabetes mellitus, controlled ([place]) 01/09/2002    ONSET DATE: 09/15/2021 (referral date)  REFERRING DIAG: E99.371I (ICD-10-CM) - Unilateral AKA, right ([place])  THERAPY DIAG:  Other abnormalities of gait and mobility  Muscle weakness (generalized)  Unsteadiness on feet  Rationale for Evaluation and Treatment: Rehabilitation  SUBJECTIVE:   SUBJECTIVE STATEMENT: Patient arrives to session reporting that he has been tired today with lab appointments, doctor's appointments, and gym. Patient reports that he just ate before PT but blood sugar is running low.   Denies changes to medication/falls. Reports no L hip pain today.   Pt accompanied by: self-came via transportation  PERTINENT HISTORY: Insulin dependent DM2, hyperlipidemia, right AKA, chronic diastolic heart failure, HTN, BPPV  PAIN:  Are you having pain? No  PRECAUTIONS: Fall  WEIGHT BEARING RESTRICTIONS: No  FALLS: Has patient fallen in last 6 months? No  LIVING ENVIRONMENT: Lives with: lives alone Lives in: House/apartment Home Access: Level entry Home layout: One level Stairs: No Has following equipment at home: Lobbyist, Environmental consultant - 2 wheeled, Wheelchair (manual), Electronics engineer, and Grab bars  PLOF: Independent with household mobility with device, Independent with community mobility with device, Independent with transfers, and Needs assistance with homemaking  PATIENT GOALS: "To learn how to walk with  this (gestures to prosthetic) effectively, build my endurance and get out of this chair!"  OBJECTIVE:  Vitals:   01/31/22 1555  BP: 128/78     DIAGNOSTIC FINDINGS: No recent relevant imaging.1  FUNCTIONAL TESTS:  5 times sit to stand: 16.32" sec w/ BUE support in // bars; '1\' 32"'$  with RUE support in // bars 2 minute walk test: 60 feet 2/10 RPE at start 4/10 RPE at end with use of FWW + SBA and wheel chair follow, x3 standing rest breaks <10" 10 meter walk test: 56" or 0.17 m/s with FWW + SBA Berg Balance Scale: Not tested due to patients's current functional balance  CURRENT PROSTHETIC WEAR ASSESSMENT: Patient is independent with: skin check, residual limb care, care of non-amputated limb, prosthetic cleaning, ply sock cleaning, correct ply sock adjustment, proper wear schedule/adjustment, and proper weight-bearing schedule/adjustment Patient is dependent with:  N/A Donning prosthesis: Complete Independence Doffing prosthesis: Complete Independence Prosthetic wear tolerance: 4-8 hours/day, 7 days/week Prosthetic weight bearing tolerance: roughly 5 minutes Edema: pt reports stable limb edema Residual limb condition: Pt reports  no open wounds, prefers to not doff liner. Prosthetic description: TF lanyard w/ [place] HD 4-bar mechanical knee (w/ manual lock) - pt states knee is hydraulic K code/activity level with prosthetic use: Level 2   TODAY'S TREATMENT:                                                                                                                                          TherAct: Assessed blodd glucose at start of session, which was 60 but quickly increased to 70 and then 74 as patient had eaten a chick-fil-a sandwhich; monitored for a few more minutes and increased to 108 and able to progress through session. Held walking activities due to low readings. Educated on safe blood glucose readings and importance of continued monitoring after physical activity as readings  tend to drop.   Therex:  Initiated session on stepper with isostregth for aerobic conditioning/LE strength and AROM with LE only 1 x 2, 1 x 4 minutes; with lvl 5 resistance for 2 minutes in between (removed seat and pulled [place] with wheelchair brakes and tippers in place) (RPE: 4/10 at end)  Transfer to low mat with scooting and adequate wheel chair clearance to R on/ L off mat without AD and SBA  - Alt arms/legs superman 2 x 10  - Modified plank with bilateral LE on bolster:    - Plank 1: 39"   - Plank 2: 60'   - Plank 3: 17.8"   - Plank 4: 38"  PATIENT EDUCATION: Education details: Continue HEP; safe blood glucose ranges Person educated: Patient Education method: Theatre stage manager Education comprehension: verbalized understanding  HOME EXERCISE PROGRAM: Use of FWW + prosthetic leg with sit to stands during gym sessions in mornings  Printed for patient: -STS (use counter support if needed vs RW) -Standing march (kicking RLE straight) -Standing wide stance w/ counter support progressing to no UE support (use chair behind) *PT forgot to save to patient instructions following printing.  URL: [email or web address] Date: 01/19/2022 Prepared by: Malachi Carl  Exercises - Sidelying Hip Abduction (AKA)  - 1 x daily - 7 x weekly - 3 sets - 10 reps - Prone Hip Extension with Residual Limb (AKA)  - 1 x daily - 7 x weekly - 3 sets - 10 reps - Supine Bridge with Bilateral LE and Bolster for RLE (BKA)  - 1 x daily - 7 x weekly - 3 sets - 10 reps  Access Code: [email or web address] URL: [email or web address] Date: 01/26/2022 Prepared by: Malachi Carl  Exercises - Seated Calf Stretch with Strap  - 1 x daily - 7 x weekly - 3 sets - 30" hold - Prone Lying with Towel Roll (Hip Flexor Stretch) (BKA)  - 1 x daily - 7 x weekly - 1 sets - 5' hold - Prone Quadriceps Stretch with Strap  - 1 x daily - 7 x weekly - 3 sets -  30" hold   ASSESSMENT:  CLINICAL  IMPRESSION: Session modified due to patient's low blood glucose readings. Once monitored to safe range, initiated session with isostrength on SciFit stepper for LE strengthening and improved activity tolerance. Remainder of session spent at mat level with emphasis on core and hip extensor stregth with reciprocal movements to mimic walking pattern. Patient tolerated well and enjoyed activity. He continues to benefit from skilled PT to further address safety with functional mobility and to progress tolerance to general activity and ambulatory distance. Will continue POC.  OBJECTIVE IMPAIRMENTS: Abnormal gait, decreased activity tolerance, decreased balance, decreased endurance, decreased mobility, difficulty walking, decreased ROM, decreased strength, improper body mechanics, prosthetic dependency , and obesity.   ACTIVITY LIMITATIONS: carrying, lifting, bending, standing, squatting, stairs, transfers, and locomotion level  PARTICIPATION LIMITATIONS: meal prep, cleaning, laundry, and driving  PERSONAL FACTORS: Fitness, Time since onset of injury/illness/exacerbation, Transportation, and 1-2 comorbidities: insulin dependent DM2 and HTN  are also affecting patient's functional outcome.   REHAB POTENTIAL: Good  CLINICAL DECISION MAKING: Evolving/moderate complexity  EVALUATION COMPLEXITY: Moderate   GOALS: Goals reviewed with patient? Yes  SHORT TERM GOALS: Target date: 01/27/2022  Pt will be independent with strength and balance HEP to maintain progress. Baseline: Provided 01/19/2022; Met on 01/24/2022 Goal status: MET  2.  Pt will decrease 5xSTS to </=13 seconds w/ use of only one hand on walker for support in order to demonstrate decreased risk for falls and improved functional bilateral LE strength and power. Baseline: 17.31 sec w/ BUE on RW; 01/24/2022 16.12 sec with BU in //; ' 32" with RUE support in // bars Goal status: IN PROGRESS  3. Patient will improve 2MWT score to 35 meters to  demonstrate improvement in aerobic capacity and endurance needed for ambulating in the community.   Baseline: 28.8 m with SBA with RW and wheel chair follow; 18 meters with SBA w/ RW but increased soreness impacting patient this session 01/24/2022 Goal status: REVISED  4.  Patient will improve gait speed to 0.35 m/s or greater with LRAD to indicate an improvement from being a household ambulator to limited community ambulator.   Baseline: 0.26 m/s with RW + SBA; 0.17 m/s with RW + SBA Goal status: REVISED  5.  Patient will demonstrate ability to stand unsupported with wide base gait for 2 minutes without LOB to improve static stability needed for safety within home setting. Baseline: Discontinued Berg as not appropriate for patient at this time. Revised goal to standing balance/component Berg goal; initial on 01/05/2022 5-52" trials with feet approximately 1.5 feet apart; improved to 62" on 01/24/2022 Goal status: REVISED  LONG TERM GOALS: Target date: 02/24/2022  Pt will be independent with finalized strengthening HEP for use in the gym at to maintain progress at home. Baseline: To be established. Goal status: INITIAL  2.   Patient will improve 2MWT score to 80 meters to demonstrate improvement in aerobic capacity and endurance needed for ambulating in the community.   Baseline: 28.8 m with SBA with RW and wheel chair follow; 18 meters with SBA w/ RW but increased soreness impacting patient this session 01/24/2022 Goal status: INITIAL  3. Patient will improve gait speed to 0.6 m/s to indicate improvement to the level of limited community ambulator in order to participate more easily in activities outside of the home.    Baseline:  0.26 m/s with RW + SBA;  0.17 m/s with RW + SBA Goal status: REVISED  4.  Patient will demonstrate ability to  stand unsupported with feet less than 8 cm apart for 2 minutes without LOB to improve static stability needed for safety within home setting. Baseline:  Discontinued Berg as not appropriate for patient at this time. Revised goal to standing balance/component Berg goal; initial on 01/05/2022 5-52" trials with feet approximately 1.5 feet apart; improved to 62" on 01/24/2022 Goal status: REVISED  PLAN:  PT FREQUENCY: 2x/week  PT DURATION: 8 weeks  PLANNED INTERVENTIONS: Therapeutic exercises, Therapeutic activity, Neuromuscular re-education, Balance training, Gait training, Patient/Family education, Self Care, Joint mobilization, Stair training, Vestibular training, Prosthetic training, DME instructions, Manual therapy, and Re-evaluation  PLAN FOR NEXT SESSION recumbent bike, pt wants to work towards elliptical-edu on safe and realistic goals as pt progresses, pt would like to work towards treadmill-overground tolerance and working on step up efficiency to access the treadmill would likely be best initially?, leg press-pt request, upright posture, standing balance, endurance, core strength, med ball slams, modified SLS tasks; trial treadmill with assist x 2  Malachi Carl, PT, DPT 01/31/2022, 4:01 PM

## 2022-02-02 ENCOUNTER — Ambulatory Visit: Payer: Medicare Other | Admitting: Physical Therapy

## 2022-02-02 ENCOUNTER — Encounter: Payer: Self-pay | Admitting: Physical Therapy

## 2022-02-02 DIAGNOSIS — M6281 Muscle weakness (generalized): Secondary | ICD-10-CM

## 2022-02-02 DIAGNOSIS — R2689 Other abnormalities of gait and mobility: Secondary | ICD-10-CM | POA: Diagnosis not present

## 2022-02-02 DIAGNOSIS — R2681 Unsteadiness on feet: Secondary | ICD-10-CM

## 2022-02-02 LAB — PTH, INTACT AND CALCIUM
Calcium, Total (PTH): 8.9 mg/dL (ref 8.7–10.2)
PTH: 221 pg/mL — ABNORMAL HIGH (ref 15–65)

## 2022-02-02 NOTE — Therapy (Signed)
OUTPATIENT PHYSICAL THERAPY PROSTHETICS TREATMENT   Patient Name: [name] MRN: [id number] DOB:Feb 13, 1975, 47 y.o., male Today's Date: 02/02/2022  PCP: Starlyn Skeans, MD REFERRING PROVIDER: Charise Killian, MD  END OF SESSION:  PT End of Session - 02/02/22 0941     Visit Number 9    Number of Visits 17    Date for PT Re-Evaluation 02/24/22    Authorization Type MEDICARE PART A AND B    PT Start Time 0934    PT Stop Time 1012    PT Time Calculation (min) 38 min    Equipment Utilized During Treatment Gait belt    Activity Tolerance Treatment limited secondary to medical complications (Comment);Patient limited by fatigue;Other (comment)   Pt received asleep in lobby, reporting he is very tired today, blood sugar 63 at onset of session.   Behavior During Therapy [facility] for tasks assessed/performed              Past Medical History:  Diagnosis Date   Acute venous embolism and thrombosis of deep vessels of proximal lower extremity (HCC) 07/19/2011   Anemia    Anginal pain (HCC)    pt denies   Anxiety    Chest pain, neg MI, normal coronaries by cath 02/18/2013   pt denies   CHF (congestive heart failure) (HCC)    Chronic renal disease, stage IV ([place]) 02/19/2013   Colon polyps    Depression    Diabetic ulcer of right foot ([place])    DVT (deep venous thrombosis) ([place]) 09/2002   patient reports additional DVTs in '06 & '11 (unconfirmed)   ED (erectile dysfunction)    GERD (gastroesophageal reflux disease)    History of blood transfusion    "related to OR" (10/31/2016)   Hyperlipidemia 02/19/2013   Hypertension    Nausea & vomiting    "constant for the last couple weeks" (10/31/2016)   Nephrotic syndrome    Obesity    BMI 44, weight 346 pounds 01/30/14   OSA (obstructive sleep apnea) 02/28/2018   Mild obstructive sleep apnea with an AHI of 9.8/h but severe during REM sleep with an AHI of 33.8/h.  Oxygen saturations dropped to 86% and there was moderate snoring   Peripheral  edema    Pneumonia    Prosthesis adjustment 08/17/2016   Pulmonary embolism ([place]) 09/2002   treated with 6 months of warfarin   Pyelonephritis 02/02/2018   Type I diabetes mellitus ([place]) dx'd 2001   Urinary retention    Past Surgical History:  Procedure Laterality Date   AMPUTATION Right 12/22/2013   Procedure: AMPUTATION BELOW KNEE;  Surgeon: [name], MD;  Location: [place];  Service: Orthopedics;  Laterality: Right;   AMPUTATION Right 02/19/2014   Procedure: RIGHT ABOVE KNEE AMPUTATION ;  Surgeon: [name], MD;  Location: [place];  Service: Orthopedics;  Laterality: Right;   blood clot removal  2016   CARDIAC CATHETERIZATION  02/18/2013   normal coronaries   ESOPHAGOGASTRODUODENOSCOPY N/A 11/02/2016   Procedure: ESOPHAGOGASTRODUODENOSCOPY (EGD);  Surgeon: [name], MD;  Location: [facility] ENDOSCOPY;  Service: Endoscopy;  Laterality: N/A;   I & D EXTREMITY Right 12/14/2013   Procedure: IRRIGATION AND DEBRIDEMENT RIGHT FOOT;  Surgeon: [name], MD;  Location: [place];  Service: Orthopedics;  Laterality: Right;   INCISION AND DRAINAGE ABSCESS  2007; 2015   "back"   INCISION AND DRAINAGE OF WOUND Right 12/22/2013   Procedure: I&D RIGHT BUTTOCK;  Surgeon: [name], MD;  Location: [place];  Service: Orthopedics;  Laterality: Right;   LEFT HEART CATHETERIZATION WITH CORONARY ANGIOGRAM N/A 02/18/2013   Procedure: LEFT HEART CATHETERIZATION WITH CORONARY ANGIOGRAM;  Surgeon: [name], MD;  Location: [facility] CATH LAB;  Service: Cardiovascular;  Laterality: N/A;   Patient Active Problem List   Diagnosis Date Noted   Diabetic neuropathy ([place]) 01/24/2022   Trapezius strain, left, initial encounter 02/02/2021   Left shoulder pain 12/30/2020   Acute pulmonary edema (HCC)    Benign paroxysmal positional vertigo 09/14/2020   Anemia associated with chronic renal failure    Hemoglobinuria 12/12/2018   OSA (obstructive sleep apnea) 02/28/2018   Chronic diastolic heart failure ([place])  02/02/2018   Preventative health care 08/15/2017   Erectile dysfunction 04/22/2017   History of pulmonary embolus (PE) 06/09/2016   Depression 05/04/2016   Hypogonadism in male 05/04/2016   Urinary retention 05/04/2016   Dyslipidemia 08/12/2014   Unilateral AKA, right ([place]) 12/26/2013   Hyperlipidemia 02/19/2013   S/P cardiac cath, 02/18/13, normal coronaries 02/19/2013   Chronic renal insufficiency, stage 3 (moderate) ([place]) 02/19/2013   Hypertension 07/24/2011   Long term current use of anticoagulant therapy 07/19/2011   History of DVT (deep vein thrombosis) 07/28/2009   Morbid obesity ([place]) 10/26/2005   Insulin dependent type 2 diabetes mellitus, controlled ([place]) 01/09/2002    ONSET DATE: 09/15/2021 (referral date)  REFERRING DIAG: X09.604V (ICD-10-CM) - Unilateral AKA, right ([place])  THERAPY DIAG:  Other abnormalities of gait and mobility  Muscle weakness (generalized)  Unsteadiness on feet  Rationale for Evaluation and Treatment: Rehabilitation  SUBJECTIVE:   SUBJECTIVE STATEMENT: Patient received asleep in wheelchair in lobby.  Upon waking patient he reports he is extremely tired today due to going to the gym prior to PT and having tutoring right after.  He states he has a full day.  Upon wheeling himself back to mat table he shows PT 'horse stance' on phone for discussion of performance at gym.   Denies changes to medication/falls. Reports no L hip pain today.   Pt accompanied by: self-came via transportation  PERTINENT HISTORY: Insulin dependent DM2, hyperlipidemia, right AKA, chronic diastolic heart failure, HTN, BPPV  PAIN:  Are you having pain? No  PRECAUTIONS: Fall  WEIGHT BEARING RESTRICTIONS: No  FALLS: Has patient fallen in last 6 months? No  LIVING ENVIRONMENT: Lives with: lives alone Lives in: House/apartment Home Access: Level entry Home layout: One level Stairs: No Has following equipment at home: Lobbyist, Environmental consultant - 2 wheeled,  Wheelchair (manual), Electronics engineer, and Grab bars  PLOF: Independent with household mobility with device, Independent with community mobility with device, Independent with transfers, and Needs assistance with homemaking  PATIENT GOALS: "To learn how to walk with this (gestures to prosthetic) effectively, build my endurance and get out of this chair!"  OBJECTIVE:  There were no vitals filed for this visit.    DIAGNOSTIC FINDINGS: No recent relevant imaging.1  FUNCTIONAL TESTS:  5 times sit to stand: 16.32" sec w/ BUE support in // bars; '1\' 32"'$  with RUE support in // bars 2 minute walk test: 60 feet 2/10 RPE at start 4/10 RPE at end with use of FWW + SBA and wheel chair follow, x3 standing rest breaks <10" 10 meter walk test: 56" or 0.17 m/s with FWW + SBA Berg Balance Scale: Not tested due to patients's current functional balance  CURRENT PROSTHETIC WEAR ASSESSMENT: Patient is independent with: skin check, residual limb care, care of non-amputated limb, prosthetic cleaning, ply sock cleaning,  correct ply sock adjustment, proper wear schedule/adjustment, and proper weight-bearing schedule/adjustment Patient is dependent with:  N/A Donning prosthesis: Complete Independence Doffing prosthesis: Complete Independence Prosthetic wear tolerance: 4-8 hours/day, 7 days/week Prosthetic weight bearing tolerance: roughly 5 minutes Edema: pt reports stable limb edema Residual limb condition: Pt reports no open wounds, prefers to not doff liner. Prosthetic description: TF lanyard w/ [place] HD 4-bar mechanical knee (w/ manual lock) - pt states knee is hydraulic K code/activity level with prosthetic use: Level 2 *Remains unchanged 02/02/2022  TODAY'S TREATMENT:                                                                                                                                         -PT discusses and demonstrates modification to 'horse stance' (basically crouched open LE stance w/ erect  posture) for safe performance at gym, discussed needing sturdy w/c or other bench close to bottom and using heavy duty rail to pull into crouched stance w/ starting practice of activity while holding onto bar.  TherAct: Assessed blood glucose at start of session, which was 63, provided Coke drink for improvement w/ reassessment at 2 minutes following intake w/ improvement to 65, at 5 minutes following this reading pt improves to 69; He only had half of a mini bagel before he came to appt and states his sugar dropped at the gym this morning.  He typically feels excessively tired/drained when blood sugar drops, denies other notable symptoms when blood sugar is high or low.  At 9:55 blood sugar only at 71.  Blood sugar 95 at end of session. Held further activity due to low readings. Re-Educated on safe blood glucose readings and importance of continued monitoring after physical activity as readings tend to drop and discussed adequate nutrition in relation to activity timeline to prevent excessive drops.   PATIENT EDUCATION: Education details: Continue HEP; safe blood glucose ranges Person educated: Patient Education method: Theatre stage manager Education comprehension: verbalized understanding  HOME EXERCISE PROGRAM: Use of FWW + prosthetic leg with sit to stands during gym sessions in mornings  Printed for patient: -STS (use counter support if needed vs RW) -Standing march (kicking RLE straight) -Standing wide stance w/ counter support progressing to no UE support (use chair behind) *PT forgot to save to patient instructions following printing.  URL: [email or web address] Date: 01/19/2022 Prepared by: Malachi Carl  Exercises - Sidelying Hip Abduction (AKA)  - 1 x daily - 7 x weekly - 3 sets - 10 reps - Prone Hip Extension with Residual Limb (AKA)  - 1 x daily - 7 x weekly - 3 sets - 10 reps - Supine Bridge with Bilateral LE and Bolster for RLE (BKA)  - 1 x daily - 7 x  weekly - 3 sets - 10 reps  Access Code: [email or web address] URL: [email or web address] Date: 01/26/2022 Prepared by: Malachi Carl  Exercises - Seated Calf Stretch with Strap  -  1 x daily - 7 x weekly - 3 sets - 30" hold - Prone Lying with Towel Roll (Hip Flexor Stretch) (BKA)  - 1 x daily - 7 x weekly - 1 sets - 5' hold - Prone Quadriceps Stretch with Strap  - 1 x daily - 7 x weekly - 3 sets - 30" hold   ASSESSMENT:  CLINICAL IMPRESSION: Session significantly limited by low blood sugar with slow recovery this session compared to last.  PT provided sugar drink for improved vitals with monitoring throughout.  Discussed guidelines for activity with ongoing blood sugar fluctuation and modifications to gym routine for safety.  OBJECTIVE IMPAIRMENTS: Abnormal gait, decreased activity tolerance, decreased balance, decreased endurance, decreased mobility, difficulty walking, decreased ROM, decreased strength, improper body mechanics, prosthetic dependency , and obesity.   ACTIVITY LIMITATIONS: carrying, lifting, bending, standing, squatting, stairs, transfers, and locomotion level  PARTICIPATION LIMITATIONS: meal prep, cleaning, laundry, and driving  PERSONAL FACTORS: Fitness, Time since onset of injury/illness/exacerbation, Transportation, and 1-2 comorbidities: insulin dependent DM2 and HTN  are also affecting patient's functional outcome.   REHAB POTENTIAL: Good  CLINICAL DECISION MAKING: Evolving/moderate complexity  EVALUATION COMPLEXITY: Moderate   GOALS: Goals reviewed with patient? Yes  SHORT TERM GOALS: Target date: 01/27/2022  Pt will be independent with strength and balance HEP to maintain progress. Baseline: Provided 01/19/2022; Met on 01/24/2022 Goal status: MET  2.  Pt will decrease 5xSTS to </=13 seconds w/ use of only one hand on walker for support in order to demonstrate decreased risk for falls and improved functional bilateral LE strength and power. Baseline:  17.31 sec w/ BUE on RW; 01/24/2022 16.12 sec with BU in //; ' 32" with RUE support in // bars Goal status: IN PROGRESS  3. Patient will improve 2MWT score to 35 meters to demonstrate improvement in aerobic capacity and endurance needed for ambulating in the community.   Baseline: 28.8 m with SBA with RW and wheel chair follow; 18 meters with SBA w/ RW but increased soreness impacting patient this session 01/24/2022 Goal status: REVISED  4.  Patient will improve gait speed to 0.35 m/s or greater with LRAD to indicate an improvement from being a household ambulator to limited community ambulator.   Baseline: 0.26 m/s with RW + SBA; 0.17 m/s with RW + SBA Goal status: REVISED  5.  Patient will demonstrate ability to stand unsupported with wide base gait for 2 minutes without LOB to improve static stability needed for safety within home setting. Baseline: Discontinued Berg as not appropriate for patient at this time. Revised goal to standing balance/component Berg goal; initial on 01/05/2022 5-52" trials with feet approximately 1.5 feet apart; improved to 62" on 01/24/2022 Goal status: REVISED  LONG TERM GOALS: Target date: 02/24/2022  Pt will be independent with finalized strengthening HEP for use in the gym at to maintain progress at home. Baseline: To be established. Goal status: INITIAL  2.   Patient will improve 2MWT score to 80 meters to demonstrate improvement in aerobic capacity and endurance needed for ambulating in the community.   Baseline: 28.8 m with SBA with RW and wheel chair follow; 18 meters with SBA w/ RW but increased soreness impacting patient this session 01/24/2022 Goal status: INITIAL  3. Patient will improve gait speed to 0.6 m/s to indicate improvement to the level of limited community ambulator in order to participate more easily in activities outside of the home.    Baseline:  0.26 m/s with RW + SBA;  0.17  m/s with RW + SBA Goal status: REVISED  4.  Patient will  demonstrate ability to stand unsupported with feet less than 8 cm apart for 2 minutes without LOB to improve static stability needed for safety within home setting. Baseline: Discontinued Berg as not appropriate for patient at this time. Revised goal to standing balance/component Berg goal; initial on 01/05/2022 5-52" trials with feet approximately 1.5 feet apart; improved to 62" on 01/24/2022 Goal status: REVISED  PLAN:  PT FREQUENCY: 2x/week  PT DURATION: 8 weeks  PLANNED INTERVENTIONS: Therapeutic exercises, Therapeutic activity, Neuromuscular re-education, Balance training, Gait training, Patient/Family education, Self Care, Joint mobilization, Stair training, Vestibular training, Prosthetic training, DME instructions, Manual therapy, and Re-evaluation  PLAN FOR NEXT SESSION recumbent bike, pt wants to work towards elliptical-edu on safe and realistic goals as pt progresses, pt would like to work towards treadmill-overground tolerance and working on step up efficiency to access the treadmill would likely be best initially?, leg press-pt request, upright posture, standing balance, endurance, core strength, med ball slams-pt is resistant to using 10 lb med ball as he typically uses 50 lb at gym, modified SLS tasks; trial treadmill with assist x 2, unilateral support reaching all directions (forward/laterally/to floor/crossbody), forward T w/ unilateral support (kick prosthetic back)  Elease Etienne, PT, DPT 02/02/2022, 10:12 AM

## 2022-02-03 DIAGNOSIS — D631 Anemia in chronic kidney disease: Secondary | ICD-10-CM | POA: Diagnosis not present

## 2022-02-03 DIAGNOSIS — Z794 Long term (current) use of insulin: Secondary | ICD-10-CM | POA: Diagnosis not present

## 2022-02-03 DIAGNOSIS — Z86718 Personal history of other venous thrombosis and embolism: Secondary | ICD-10-CM | POA: Diagnosis not present

## 2022-02-03 DIAGNOSIS — E669 Obesity, unspecified: Secondary | ICD-10-CM | POA: Diagnosis not present

## 2022-02-03 DIAGNOSIS — E876 Hypokalemia: Secondary | ICD-10-CM | POA: Diagnosis not present

## 2022-02-03 DIAGNOSIS — N2581 Secondary hyperparathyroidism of renal origin: Secondary | ICD-10-CM | POA: Diagnosis not present

## 2022-02-03 DIAGNOSIS — N184 Chronic kidney disease, stage 4 (severe): Secondary | ICD-10-CM | POA: Diagnosis not present

## 2022-02-03 DIAGNOSIS — I5032 Chronic diastolic (congestive) heart failure: Secondary | ICD-10-CM | POA: Diagnosis not present

## 2022-02-03 DIAGNOSIS — E1122 Type 2 diabetes mellitus with diabetic chronic kidney disease: Secondary | ICD-10-CM | POA: Diagnosis not present

## 2022-02-03 DIAGNOSIS — I129 Hypertensive chronic kidney disease with stage 1 through stage 4 chronic kidney disease, or unspecified chronic kidney disease: Secondary | ICD-10-CM | POA: Diagnosis not present

## 2022-02-06 ENCOUNTER — Other Ambulatory Visit: Payer: Self-pay | Admitting: Student

## 2022-02-07 ENCOUNTER — Ambulatory Visit: Payer: Medicare Other | Admitting: Physical Therapy

## 2022-02-07 DIAGNOSIS — E113212 Type 2 diabetes mellitus with mild nonproliferative diabetic retinopathy with macular edema, left eye: Secondary | ICD-10-CM | POA: Diagnosis not present

## 2022-02-09 ENCOUNTER — Ambulatory Visit: Payer: Medicare Other | Admitting: Physical Therapy

## 2022-02-10 ENCOUNTER — Encounter: Payer: Self-pay | Admitting: Physical Therapy

## 2022-02-10 ENCOUNTER — Ambulatory Visit: Payer: Medicare Other | Attending: Internal Medicine | Admitting: Physical Therapy

## 2022-02-10 VITALS — BP 137/70 | HR 80

## 2022-02-10 DIAGNOSIS — M6281 Muscle weakness (generalized): Secondary | ICD-10-CM | POA: Insufficient documentation

## 2022-02-10 DIAGNOSIS — R2689 Other abnormalities of gait and mobility: Secondary | ICD-10-CM | POA: Insufficient documentation

## 2022-02-10 DIAGNOSIS — R2681 Unsteadiness on feet: Secondary | ICD-10-CM | POA: Diagnosis not present

## 2022-02-10 NOTE — Therapy (Signed)
OUTPATIENT PHYSICAL THERAPY PROSTHETICS TREATMENT   Patient Name: [name] MRN: [id number] DOB:September 08, 1975, 47 y.o., male Today's Date: 02/10/2022  PCP: Starlyn Skeans, MD REFERRING PROVIDER: Charise Killian, MD  END OF SESSION:  PT End of Session - 02/10/22 0906     Visit Number 10    Number of Visits 17    Date for PT Re-Evaluation 02/24/22    Authorization Type MEDICARE PART A AND B    PT Start Time 0927    PT Stop Time 1020    PT Time Calculation (min) 53 min    Equipment Utilized During Treatment Gait belt    Activity Tolerance Patient limited by fatigue   required seated breaks intermittently   Behavior During Therapy WFL for tasks assessed/performed              Past Medical History:  Diagnosis Date   Acute venous embolism and thrombosis of deep vessels of proximal lower extremity ([place]) 07/19/2011   Anemia    Anginal pain ([place])    pt denies   Anxiety    Chest pain, neg MI, normal coronaries by cath 02/18/2013   pt denies   CHF (congestive heart failure) ([place])    Chronic renal disease, stage IV ([place]) 02/19/2013   Colon polyps    Depression    Diabetic ulcer of right foot ([place])    DVT (deep venous thrombosis) ([place]) 09/2002   patient reports additional DVTs in '06 & '11 (unconfirmed)   ED (erectile dysfunction)    GERD (gastroesophageal reflux disease)    History of blood transfusion    "related to OR" (10/31/2016)   Hyperlipidemia 02/19/2013   Hypertension    Nausea & vomiting    "constant for the last couple weeks" (10/31/2016)   Nephrotic syndrome    Obesity    BMI 44, weight 346 pounds 01/30/14   OSA (obstructive sleep apnea) 02/28/2018   Mild obstructive sleep apnea with an AHI of 9.8/h but severe during REM sleep with an AHI of 33.8/h.  Oxygen saturations dropped to 86% and there was moderate snoring   Peripheral edema    Pneumonia    Prosthesis adjustment 08/17/2016   Pulmonary embolism ([place]) 09/2002   treated with 6 months of warfarin    Pyelonephritis 02/02/2018   Type I diabetes mellitus ([place]) dx'd 2001   Urinary retention    Past Surgical History:  Procedure Laterality Date   AMPUTATION Right 12/22/2013   Procedure: AMPUTATION BELOW KNEE;  Surgeon: [name], MD;  Location: [place];  Service: Orthopedics;  Laterality: Right;   AMPUTATION Right 02/19/2014   Procedure: RIGHT ABOVE KNEE AMPUTATION ;  Surgeon: [name], MD;  Location: [place];  Service: Orthopedics;  Laterality: Right;   blood clot removal  2016   CARDIAC CATHETERIZATION  02/18/2013   normal coronaries   ESOPHAGOGASTRODUODENOSCOPY N/A 11/02/2016   Procedure: ESOPHAGOGASTRODUODENOSCOPY (EGD);  Surgeon: [name], MD;  Location[facility]OSCOPY;  Service: Endoscopy;  Laterality: N/A;   I & D EXTREMITY Right 12/14/2013   Procedure: IRRIGATION AND DEBRIDEMENT RIGHT FOOT;  Surgeon: [name], MD;  Location: [place];  Service: Orthopedics;  Laterality: Right;   INCISION AND DRAINAGE ABSCESS  2007; 2015   "back"   INCISION AND DRAINAGE OF WOUND Right 12/22/2013   Procedure: I&D RIGHT BUTTOCK;  Surgeon: [name], MD;  Location: [place];  Service: Orthopedics;  Laterality: Right;   LEFT HEART CATHETERIZATION WITH CORONARY ANGIOGRAM N/A 02/18/2013   Procedure: LEFT HEART  CATHETERIZATION WITH CORONARY ANGIOGRAM;  Surgeon: [name], MD;  Location: [facility] CATH LAB;  Service: Cardiovascular;  Laterality: N/A;   Patient Active Problem List   Diagnosis Date Noted   Diabetic neuropathy ([place]) 01/24/2022   Trapezius strain, left, initial encounter 02/02/2021   Left shoulder pain 12/30/2020   Acute pulmonary edema ([facility])    Benign paroxysmal positional vertigo 09/14/2020   Anemia associated with chronic renal failure    Hemoglobinuria 12/12/2018   OSA (obstructive sleep apnea) 02/28/2018   Chronic diastolic heart failure ([place]) 02/02/2018   Preventative health care 08/15/2017   Erectile dysfunction 04/22/2017   History of pulmonary embolus (PE) 06/09/2016    Depression 05/04/2016   Hypogonadism in male 05/04/2016   Urinary retention 05/04/2016   Dyslipidemia 08/12/2014   Unilateral AKA, right ([place]) 12/26/2013   Hyperlipidemia 02/19/2013   S/P cardiac cath, 02/18/13, normal coronaries 02/19/2013   Chronic renal insufficiency, stage 3 (moderate) ([place]) 02/19/2013   Hypertension 07/24/2011   Long term current use of anticoagulant therapy 07/19/2011   History of DVT (deep vein thrombosis) 07/28/2009   Morbid obesity ([place]) 10/26/2005   Insulin dependent type 2 diabetes mellitus, controlled ([place]) 01/09/2002    ONSET DATE: 09/15/2021 (referral date)  REFERRING DIAG: Y63.785Y (ICD-10-CM) - Unilateral AKA, right ([place])  THERAPY DIAG:  Other abnormalities of gait and mobility  Muscle weakness (generalized)  Unsteadiness on feet  Rationale for Evaluation and Treatment: Rehabilitation  SUBJECTIVE:   SUBJECTIVE STATEMENT: Patient reports he missed on Tuesday as his labs took a long. Patient denies falls/near falls. Reports no changes to medications. Requests trialing treadmill sometime next week if possible.  Pt accompanied by: self-came via transportation  PERTINENT HISTORY: Insulin dependent DM2, hyperlipidemia, right AKA, chronic diastolic heart failure, HTN, BPPV  PAIN:  Are you having pain? No  PRECAUTIONS: Fall  WEIGHT BEARING RESTRICTIONS: No  FALLS: Has patient fallen in last 6 months? No  LIVING ENVIRONMENT: Lives with: lives alone Lives in: House/apartment Home Access: Level entry Home layout: One level Stairs: No Has following equipment at home: Lobbyist, Environmental consultant - 2 wheeled, Wheelchair (manual), Electronics engineer, and Grab bars  PLOF: Independent with household mobility with device, Independent with community mobility with device, Independent with transfers, and Needs assistance with homemaking  PATIENT GOALS: "To learn how to walk with this (gestures to prosthetic) effectively, build my endurance and get out of  this chair!"  OBJECTIVE:  Vitals:   02/10/22 0933  BP: 137/70  Pulse: 80    Resting blood glucose: 133  DIAGNOSTIC FINDINGS: No recent relevant imaging.1   CURRENT PROSTHETIC WEAR ASSESSMENT: Patient is independent with: skin check, residual limb care, care of non-amputated limb, prosthetic cleaning, ply sock cleaning, correct ply sock adjustment, proper wear schedule/adjustment, and proper weight-bearing schedule/adjustment Patient is dependent with:  N/A Donning prosthesis: Complete Independence Doffing prosthesis: Complete Independence Prosthetic wear tolerance: 4-8 hours/day, 7 days/week Prosthetic weight bearing tolerance: roughly 5 minutes Edema: pt reports stable limb edema Residual limb condition: Pt reports no open wounds, prefers to not doff liner. Prosthetic description: TF lanyard w/ [place] HD 4-bar mechanical knee (w/ manual lock) - pt states knee is hydraulic K code/activity level with prosthetic use: Level 2 *Remains unchanged 02/02/2022  TODAY'S TREATMENT:  There Act:  [place] <> Floor Transfer: Patient scooted off front of [place] in tricep dip position to floor with prosthetic doffed; utilized use of [place] breaks but demonstrated reduced eccentric control with lower in bilateral UE; pulled up on [facility] with bilateral UE use and performed 180 pivot to sit (performed rapidly with reduced safety awareness with strong reliance on momentum) Therapist was SBA and moved to [place] but patient stated "get your hands off me; I can do it."  Assessment/Interpretation of goal progress with functional tasks to improve safety in home environment: 5 times sit to stand: 11.78" sec w/ BUE support in // bars; 13.98" with RUE support in // bars 2 minute walk test: 115 feet at 4/10 RPE at end with use of FWW + SBA and wheel chair follow 10 meter walk test: 42" or  0.24 m/s with FWW + SBA Static standing balance without UE support with WBOS x 15" (SBA)  Self Care: Patient wore R prosthetic with 1 sock in today's session; appeared to be looser in today's session and recommended possible trial of 2 socks for increased stability or follow up with hanger for additional adjustments.   PATIENT EDUCATION: Education details: Continue HEP; safe blood glucose ranges; reviewed progress towards goals/interpretation of findings; safety with transfers Person educated: Patient Education method: Explanation and Handouts Education comprehension: verbalized understanding  HOME EXERCISE PROGRAM: Use of FWW + prosthetic leg with sit to stands during gym sessions in mornings  Printed for patient: -STS (use counter support if needed vs RW) -Standing march (kicking RLE straight) -Standing wide stance w/ counter support progressing to no UE support (use chair behind) *PT forgot to save to patient instructions following printing.  URL: [email or web address] Date: 01/19/2022 Prepared by: Malachi Carl  Exercises - Sidelying Hip Abduction (AKA)  - 1 x daily - 7 x weekly - 3 sets - 10 reps - Prone Hip Extension with Residual Limb (AKA)  - 1 x daily - 7 x weekly - 3 sets - 10 reps - Supine Bridge with Bilateral LE and Bolster for RLE (BKA)  - 1 x daily - 7 x weekly - 3 sets - 10 reps  Access Code: [email or web address] URL: [email or web address] Date: 01/26/2022 Prepared by: Malachi Carl  Exercises - Seated Calf Stretch with Strap  - 1 x daily - 7 x weekly - 3 sets - 30" hold - Prone Lying with Towel Roll (Hip Flexor Stretch) (BKA)  - 1 x daily - 7 x weekly - 1 sets - 5' hold - Prone Quadriceps Stretch with Strap  - 1 x daily - 7 x weekly - 3 sets - 30" hold   ASSESSMENT:  CLINICAL IMPRESSION: Session emphasized progress towards goals. Patient improved in all areas with the exception of static standing balance with regressed to 15" likely due to  end of session fatigue. Patient significantly improved 5xSTS with both bilateral and single UE use. Demonstrates improved cardiorespiratory endurance and gait speed as well. Continues to be a household ambulator and at a high risk for falls as indicated by gait speed but continues to progress. Patient demonstrates ability to perform [facility] to floor transfer; requires min education on pacing to improve safety. Recommend continued skilled physical therapy services to address impairments, improve function, and progress towards goals. Continue POC.   OBJECTIVE IMPAIRMENTS: Abnormal gait, decreased activity tolerance, decreased balance, decreased endurance, decreased mobility, difficulty walking, decreased ROM, decreased strength, improper body mechanics, prosthetic dependency , and obesity.   ACTIVITY LIMITATIONS: carrying, lifting, bending, standing, squatting,  stairs, transfers, and locomotion level  PARTICIPATION LIMITATIONS: meal prep, cleaning, laundry, and driving  PERSONAL FACTORS: Fitness, Time since onset of injury/illness/exacerbation, Transportation, and 1-2 comorbidities: insulin dependent DM2 and HTN  are also affecting patient's functional outcome.   REHAB POTENTIAL: Good  CLINICAL DECISION MAKING: Evolving/moderate complexity  EVALUATION COMPLEXITY: Moderate   GOALS: Goals reviewed with patient? Yes  SHORT TERM GOALS: Target date: 01/27/2022  Pt will be independent with strength and balance HEP to maintain progress. Baseline: Provided 01/19/2022; Met on 01/24/2022 Goal status: MET  2.  Pt will decrease 5xSTS to </=13 seconds w/ use of only one hand on walker for support in order to demonstrate decreased risk for falls and improved functional bilateral LE strength and power. Baseline: 17.31 sec w/ BUE on RW; 01/24/2022 16.12 sec with BU in //; ' 32" with RUE support in // bars; 11.78" sec w/ BUE support in // bars; 13.98" with RUE support in // bars on 02/10/2022 Goal status: IN  PROGRESS  3. Patient will improve 2MWT score to 35 meters to demonstrate improvement in aerobic capacity and endurance needed for ambulating in the community.   Baseline: 28.8 m with SBA with RW and wheel chair follow; 18 meters with SBA w/ RW but increased soreness impacting patient this session 01/24/2022;  115 feet or 24mat 4/10 RPE at end with use of FWW + SBA and wheel chair follow on 02/10/2022 Goal status: MET  4.  Patient will improve gait speed to 0.35 m/s or greater with LRAD to indicate an improvement from being a household ambulator to limited community ambulator.   Baseline: 0.26 m/s with RW + SBA; 0.17 m/s with RW + SBA; 42" or 0.24 m/s with FWW + SBA on 02/10/2022 Goal status: IN PROGRESS  5.  Patient will demonstrate ability to stand unsupported with wide base gait for 2 minutes without LOB to improve static stability needed for safety within home setting. Baseline: Discontinued Berg as not appropriate for patient at this time. Revised goal to standing balance/component Berg goal; initial on 01/05/2022 5-52" trials with feet approximately 1.5 feet apart; improved to 62" on 01/24/2022; Static standing balance without UE support with WBOS x 15" (SBA) on 02/10/2022 Goal status: NOT MET  LONG TERM GOALS: Target date: 02/24/2022  Pt will be independent with finalized strengthening HEP for use in the gym at to maintain progress at home. Baseline: To be established. Goal status: INITIAL  2.   Patient will improve 2MWT score to 80 meters to demonstrate improvement in aerobic capacity and endurance needed for ambulating in the community.   Baseline: 28.8 m with SBA with RW and wheel chair follow; 18 meters with SBA w/ RW but increased soreness impacting patient this session 01/24/2022; 115 feet or 342mt 4/10 RPE at end with use of FWW + SBA and wheel chair follow on 02/10/2022 Goal status: IN PROGRESS  3. Patient will improve gait speed to 0.6 m/s to indicate improvement to the level of limited  community ambulator in order to participate more easily in activities outside of the home.    Baseline:  0.26 m/s with RW + SBA;  0.17 m/s with RW + SBA; 42" or 0.24 m/s with FWW + SBA Goal status: IN PROGRESS  4.   Patient will demonstrate ability to stand unsupported with wide base gait for 2 minutes without LOB to improve static stability needed for safety within home setting. Baseline: Discontinued Berg as not appropriate for patient at this time.  Revised goal to standing balance/component Berg goal; initial on 01/05/2022 5-52" trials with feet approximately 1.5 feet apart; improved to 62" on 01/24/2022; Static standing balance without UE support with WBOS x 15" (SBA) on 02/10/2022 Goal status: REVISED  PLAN:  PT FREQUENCY: 2x/week  PT DURATION: 8 weeks  PLANNED INTERVENTIONS: Therapeutic exercises, Therapeutic activity, Neuromuscular re-education, Balance training, Gait training, Patient/Family education, Self Care, Joint mobilization, Stair training, Vestibular training, Prosthetic training, DME instructions, Manual therapy, and Re-evaluation  PLAN FOR NEXT SESSION recumbent bike, pt wants to work towards elliptical-edu on safe and realistic goals as pt progresses, pt would like to work towards treadmill-overground tolerance and working on step up efficiency to access the treadmill would likely be best initially?, leg press-pt request, upright posture, standing balance, endurance, core strength, med ball slams-pt is resistant to using 10 lb med ball as he typically uses 50 lb at gym, modified SLS tasks; trial treadmill with assist x 2, unilateral support reaching all directions (forward/laterally/to floor/crossbody), forward T w/ unilateral support (kick prosthetic back)  Malachi Carl, PT, DPT 02/10/2022, 10:49 AM

## 2022-02-13 ENCOUNTER — Ambulatory Visit: Payer: Medicare Other | Admitting: Physical Therapy

## 2022-02-13 ENCOUNTER — Encounter: Payer: Self-pay | Admitting: Physical Therapy

## 2022-02-13 DIAGNOSIS — R2681 Unsteadiness on feet: Secondary | ICD-10-CM | POA: Diagnosis not present

## 2022-02-13 DIAGNOSIS — R2689 Other abnormalities of gait and mobility: Secondary | ICD-10-CM

## 2022-02-13 DIAGNOSIS — M6281 Muscle weakness (generalized): Secondary | ICD-10-CM

## 2022-02-13 NOTE — Therapy (Signed)
OUTPATIENT PHYSICAL THERAPY PROSTHETICS TREATMENT   Patient Name: [name] MRN: [id number] DOB:06-07-1975, 47 y.o., male Today's Date: 02/13/2022  PCP: Starlyn Skeans, MD REFERRING PROVIDER: Charise Killian, MD  END OF SESSION:  PT End of Session - 02/13/22 0905     Visit Number 11    Number of Visits 17    Date for PT Re-Evaluation 02/24/22    Authorization Type MEDICARE PART A AND B    PT Start Time 0857    PT Stop Time 0930    PT Time Calculation (min) 33 min    Equipment Utilized During Treatment Gait belt    Activity Tolerance Patient limited by fatigue    Behavior During Therapy WFL for tasks assessed/performed              Past Medical History:  Diagnosis Date   Acute venous embolism and thrombosis of deep vessels of proximal lower extremity ([place]) 07/19/2011   Anemia    Anginal pain ([place])    pt denies   Anxiety    Chest pain, neg MI, normal coronaries by cath 02/18/2013   pt denies   CHF (congestive heart failure) ([place])    Chronic renal disease, stage IV ([place]) 02/19/2013   Colon polyps    Depression    Diabetic ulcer of right foot ([place])    DVT (deep venous thrombosis) ([place]) 09/2002   patient reports additional DVTs in '06 & '11 (unconfirmed)   ED (erectile dysfunction)    GERD (gastroesophageal reflux disease)    History of blood transfusion    "related to OR" (10/31/2016)   Hyperlipidemia 02/19/2013   Hypertension    Nausea & vomiting    "constant for the last couple weeks" (10/31/2016)   Nephrotic syndrome    Obesity    BMI 44, weight 346 pounds 01/30/14   OSA (obstructive sleep apnea) 02/28/2018   Mild obstructive sleep apnea with an AHI of 9.8/h but severe during REM sleep with an AHI of 33.8/h.  Oxygen saturations dropped to 86% and there was moderate snoring   Peripheral edema    Pneumonia    Prosthesis adjustment 08/17/2016   Pulmonary embolism ([place]) 09/2002   treated with 6 months of warfarin   Pyelonephritis 02/02/2018   Type I diabetes  mellitus ([place]) dx'd 2001   Urinary retention    Past Surgical History:  Procedure Laterality Date   AMPUTATION Right 12/22/2013   Procedure: AMPUTATION BELOW KNEE;  Surgeon: [name], MD;  Location: [facility];  Service: Orthopedics;  Laterality: Right;   AMPUTATION Right 02/19/2014   Procedure: RIGHT ABOVE KNEE AMPUTATION ;  Surgeon: [name], MD;  Location: [facility];  Service: Orthopedics;  Laterality: Right;   blood clot removal  2016   CARDIAC CATHETERIZATION  02/18/2013   normal coronaries   ESOPHAGOGASTRODUODENOSCOPY N/A 11/02/2016   Procedure: ESOPHAGOGASTRODUODENOSCOPY (EGD);  Surgeon: [name], MD;  Location[facility];  Service: Endoscopy;  Laterality: N/A;   I & D EXTREMITY Right 12/14/2013   Procedure: IRRIGATION AND DEBRIDEMENT RIGHT FOOT;  Surgeon: [name], MD;  Location: [facility];  Service: Orthopedics;  Laterality: Right;   INCISION AND DRAINAGE ABSCESS  2007; 2015   "back"   INCISION AND DRAINAGE OF WOUND Right 12/22/2013   Procedure: I&D RIGHT BUTTOCK;  Surgeon: [name], MD;  Location: [place];  Service: Orthopedics;  Laterality: Right;   LEFT HEART CATHETERIZATION WITH CORONARY ANGIOGRAM N/A 02/18/2013   Procedure: LEFT HEART CATHETERIZATION WITH CORONARY ANGIOGRAM;  Surgeon: [name], MD;  Location: [facility] CATH LAB;  Service: Cardiovascular;  Laterality: N/A;   Patient Active Problem List   Diagnosis Date Noted   Diabetic neuropathy ([place]) 01/24/2022   Trapezius strain, left, initial encounter 02/02/2021   Left shoulder pain 12/30/2020   Acute pulmonary edema ([facility])    Benign paroxysmal positional vertigo 09/14/2020   Anemia associated with chronic renal failure    Hemoglobinuria 12/12/2018   OSA (obstructive sleep apnea) 02/28/2018   Chronic diastolic heart failure ([place]) 02/02/2018   Preventative health care 08/15/2017   Erectile dysfunction 04/22/2017   History of pulmonary embolus (PE) 06/09/2016   Depression 05/04/2016   Hypogonadism in male  05/04/2016   Urinary retention 05/04/2016   Dyslipidemia 08/12/2014   Unilateral AKA, right ([place]) 12/26/2013   Hyperlipidemia 02/19/2013   S/P cardiac cath, 02/18/13, normal coronaries 02/19/2013   Chronic renal insufficiency, stage 3 (moderate) ([place]) 02/19/2013   Hypertension 07/24/2011   Long term current use of anticoagulant therapy 07/19/2011   History of DVT (deep vein thrombosis) 07/28/2009   Morbid obesity ([place]) 10/26/2005   Insulin dependent type 2 diabetes mellitus, controlled ([place]) 01/09/2002    ONSET DATE: 09/15/2021 (referral date)  REFERRING DIAG: N62.952W (ICD-10-CM) - Unilateral AKA, right ([place])  THERAPY DIAG:  Other abnormalities of gait and mobility  Muscle weakness (generalized)  Unsteadiness on feet  Rationale for Evaluation and Treatment: Rehabilitation  SUBJECTIVE:   SUBJECTIVE STATEMENT: Patient arrives to session in notable distress. Reports that he found out yesterday that his trainer died. Patient reports that he wants to work x10 as hard to honor his Clinical research associate. Patient reports that he has a good support system at home to help manage. Denies falls/medication changes.   Pt accompanied by: self-came via transportation  PERTINENT HISTORY: Insulin dependent DM2, hyperlipidemia, right AKA, chronic diastolic heart failure, HTN, BPPV  PAIN:  Are you having pain? No  PRECAUTIONS: Fall  WEIGHT BEARING RESTRICTIONS: No  FALLS: Has patient fallen in last 6 months? No  LIVING ENVIRONMENT: Lives with: lives alone Lives in: House/apartment Home Access: Level entry Home layout: One level Stairs: No Has following equipment at home: Lobbyist, Environmental consultant - 2 wheeled, Wheelchair (manual), Electronics engineer, and Grab bars  PLOF: Independent with household mobility with device, Independent with community mobility with device, Independent with transfers, and Needs assistance with homemaking  PATIENT GOALS: "To learn how to walk with this (gestures to  prosthetic) effectively, build my endurance and get out of this chair!"  OBJECTIVE:  There were no vitals filed for this visit.  Resting blood glucose: 106  DIAGNOSTIC FINDINGS: No recent relevant imaging.1   CURRENT PROSTHETIC WEAR ASSESSMENT: Patient is independent with: skin check, residual limb care, care of non-amputated limb, prosthetic cleaning, ply sock cleaning, correct ply sock adjustment, proper wear schedule/adjustment, and proper weight-bearing schedule/adjustment Patient is dependent with:  N/A Donning prosthesis: Complete Independence Doffing prosthesis: Complete Independence Prosthetic wear tolerance: 4-8 hours/day, 7 days/week Prosthetic weight bearing tolerance: roughly 5 minutes Edema: pt reports stable limb edema Residual limb condition: Pt reports no open wounds, prefers to not doff liner. Prosthetic description: TF lanyard w/ [place] HD 4-bar mechanical knee (w/ manual lock) - pt states knee is hydraulic K code/activity level with prosthetic use: Level 2 *Remains unchanged 02/02/2022  TODAY'S TREATMENT:  Ther Ex: Nu step iso strength x 10 min with prosthetic donned  Transfer to low mat with scooting and adequate wheel chair clearance to R on/ L off mat with AD for transfer onto mat and with prosthetic doffed for transfer back and SBA  Sustained hip flexor stretch with blue bolster x 10 min prone as stretch between reps Modified plank 3 x 60", 1x20" with prosthetic doffed prone on elbows and bolster under L knee and under R residual limb  PATIENT EDUCATION: Education details: Continue HEP; safe blood glucose ranges, reaching out as needed to friends for additional support during this time Person educated: Patient Education method: Theatre stage manager Education comprehension: verbalized understanding  HOME EXERCISE  PROGRAM: Use of FWW + prosthetic leg with sit to stands during gym sessions in mornings  Printed for patient: -STS (use counter support if needed vs RW) -Standing march (kicking RLE straight) -Standing wide stance w/ counter support progressing to no UE support (use chair behind) *PT forgot to save to patient instructions following printing.  URL: [email or web address] Date: 01/19/2022 Prepared by: Malachi Carl  Exercises - Sidelying Hip Abduction (AKA)  - 1 x daily - 7 x weekly - 3 sets - 10 reps - Prone Hip Extension with Residual Limb (AKA)  - 1 x daily - 7 x weekly - 3 sets - 10 reps - Supine Bridge with Bilateral LE and Bolster for RLE (BKA)  - 1 x daily - 7 x weekly - 3 sets - 10 reps  Access Code: [email or web address] URL: [email or web address] Date: 01/26/2022 Prepared by: Malachi Carl  Exercises - Seated Calf Stretch with Strap  - 1 x daily - 7 x weekly - 3 sets - 30" hold - Prone Lying with Towel Roll (Hip Flexor Stretch) (BKA)  - 1 x daily - 7 x weekly - 1 sets - 5' hold - Prone Quadriceps Stretch with Strap  - 1 x daily - 7 x weekly - 3 sets - 30" hold   ASSESSMENT:  CLINICAL IMPRESSION: Session modified to address patient's needs for the days. Patient demonstrated strong motivation during session to work hard but requested mat/bike during session. Demonstrated increased core endurance with plank in today's session which should help progress core stability moving forward. Recommend continued skilled physical therapy services to address impairments, improve function, and progress towards goals. Continue POC.   OBJECTIVE IMPAIRMENTS: Abnormal gait, decreased activity tolerance, decreased balance, decreased endurance, decreased mobility, difficulty walking, decreased ROM, decreased strength, improper body mechanics, prosthetic dependency , and obesity.   ACTIVITY LIMITATIONS: carrying, lifting, bending, standing, squatting, stairs, transfers, and  locomotion level  PARTICIPATION LIMITATIONS: meal prep, cleaning, laundry, and driving  PERSONAL FACTORS: Fitness, Time since onset of injury/illness/exacerbation, Transportation, and 1-2 comorbidities: insulin dependent DM2 and HTN  are also affecting patient's functional outcome.   REHAB POTENTIAL: Good  CLINICAL DECISION MAKING: Evolving/moderate complexity  EVALUATION COMPLEXITY: Moderate   GOALS: Goals reviewed with patient? Yes  SHORT TERM GOALS: Target date: 01/27/2022  Pt will be independent with strength and balance HEP to maintain progress. Baseline: Provided 01/19/2022; Met on 01/24/2022 Goal status: MET  2.  Pt will decrease 5xSTS to </=13 seconds w/ use of only one hand on walker for support in order to demonstrate decreased risk for falls and improved functional bilateral LE strength and power. Baseline: 17.31 sec w/ BUE on RW; 01/24/2022 16.12 sec with BU in //; ' 32" with RUE support in // bars; 11.78" sec w/ BUE support in // bars; 13.98" with  RUE support in // bars on 02/10/2022 Goal status: IN PROGRESS  3. Patient will improve 2MWT score to 35 meters to demonstrate improvement in aerobic capacity and endurance needed for ambulating in the community.   Baseline: 28.8 m with SBA with RW and wheel chair follow; 18 meters with SBA w/ RW but increased soreness impacting patient this session 01/24/2022;  115 feet or 16mat 4/10 RPE at end with use of FWW + SBA and wheel chair follow on 02/10/2022 Goal status: MET  4.  Patient will improve gait speed to 0.35 m/s or greater with LRAD to indicate an improvement from being a household ambulator to limited community ambulator.   Baseline: 0.26 m/s with RW + SBA; 0.17 m/s with RW + SBA; 42" or 0.24 m/s with FWW + SBA on 02/10/2022 Goal status: IN PROGRESS  5.  Patient will demonstrate ability to stand unsupported with wide base gait for 2 minutes without LOB to improve static stability needed for safety within home setting. Baseline:  Discontinued Berg as not appropriate for patient at this time. Revised goal to standing balance/component Berg goal; initial on 01/05/2022 5-52" trials with feet approximately 1.5 feet apart; improved to 62" on 01/24/2022; Static standing balance without UE support with WBOS x 15" (SBA) on 02/10/2022 Goal status: NOT MET  LONG TERM GOALS: Target date: 02/24/2022  Pt will be independent with finalized strengthening HEP for use in the gym at to maintain progress at home. Baseline: To be established. Goal status: INITIAL  2.   Patient will improve 2MWT score to 80 meters to demonstrate improvement in aerobic capacity and endurance needed for ambulating in the community.   Baseline: 28.8 m with SBA with RW and wheel chair follow; 18 meters with SBA w/ RW but increased soreness impacting patient this session 01/24/2022; 115 feet or 314mt 4/10 RPE at end with use of FWW + SBA and wheel chair follow on 02/10/2022 Goal status: IN PROGRESS  3. Patient will improve gait speed to 0.6 m/s to indicate improvement to the level of limited community ambulator in order to participate more easily in activities outside of the home.    Baseline:  0.26 m/s with RW + SBA;  0.17 m/s with RW + SBA; 42" or 0.24 m/s with FWW + SBA Goal status: IN PROGRESS  4.   Patient will demonstrate ability to stand unsupported with wide base gait for 2 minutes without LOB to improve static stability needed for safety within home setting. Baseline: Discontinued Berg as not appropriate for patient at this time. Revised goal to standing balance/component Berg goal; initial on 01/05/2022 5-52" trials with feet approximately 1.5 feet apart; improved to 62" on 01/24/2022; Static standing balance without UE support with WBOS x 15" (SBA) on 02/10/2022 Goal status: REVISED  PLAN:  PT FREQUENCY: 2x/week  PT DURATION: 8 weeks  PLANNED INTERVENTIONS: Therapeutic exercises, Therapeutic activity, Neuromuscular re-education, Balance training, Gait  training, Patient/Family education, Self Care, Joint mobilization, Stair training, Vestibular training, Prosthetic training, DME instructions, Manual therapy, and Re-evaluation  PLAN FOR NEXT SESSION recumbent bike, pt wants to work towards elliptical-edu on safe and realistic goals as pt progresses, pt would like to work towards treadmill-overground tolerance and working on step up efficiency to access the treadmill would likely be best initially?, leg press-pt request, upright posture, standing balance, endurance, core strength, med ball slams-pt is resistant to using 10 lb med ball as he typically uses 50 lb at gym, modified SLS tasks; trial treadmill  with assist x 2, unilateral support reaching all directions (forward/laterally/to floor/crossbody), forward T w/ unilateral support (kick prosthetic back)  Malachi Carl, PT, DPT 02/13/2022, 12:13 PM

## 2022-02-14 ENCOUNTER — Ambulatory Visit: Payer: Medicare Other | Admitting: Physical Therapy

## 2022-02-16 ENCOUNTER — Ambulatory Visit: Payer: Medicare Other | Admitting: Physical Therapy

## 2022-02-20 ENCOUNTER — Ambulatory Visit: Payer: Medicare Other | Admitting: Physical Therapy

## 2022-02-20 ENCOUNTER — Encounter: Payer: Self-pay | Admitting: Physical Therapy

## 2022-02-20 VITALS — BP 148/79 | HR 83

## 2022-02-20 DIAGNOSIS — R2681 Unsteadiness on feet: Secondary | ICD-10-CM | POA: Diagnosis not present

## 2022-02-20 DIAGNOSIS — R2689 Other abnormalities of gait and mobility: Secondary | ICD-10-CM | POA: Diagnosis not present

## 2022-02-20 DIAGNOSIS — M6281 Muscle weakness (generalized): Secondary | ICD-10-CM

## 2022-02-20 NOTE — Therapy (Signed)
OUTPATIENT PHYSICAL THERAPY PROSTHETICS TREATMENT   Patient Name: [name] MRN: [id number] DOB:1975/10/18, 47 y.o., male Today's Date: 02/20/2022  PCP: Starlyn Skeans, MD REFERRING PROVIDER: Charise Killian, MD  END OF SESSION:  PT End of Session - 02/20/22 0803     Visit Number 12    Number of Visits 17    Date for PT Re-Evaluation 02/24/22    Authorization Type MEDICARE PART A AND B    PT Start Time 0800    PT Stop Time 0845    PT Time Calculation (min) 45 min    Equipment Utilized During Treatment Other (comment)   mat level in today's session so no gait belt donned   Activity Tolerance Patient limited by fatigue    Behavior During Therapy [facility] for tasks assessed/performed              Past Medical History:  Diagnosis Date   Acute venous embolism and thrombosis of deep vessels of proximal lower extremity ([place]) 07/19/2011   Anemia    Anginal pain ([place])    pt denies   Anxiety    Chest pain, neg MI, normal coronaries by cath 02/18/2013   pt denies   CHF (congestive heart failure) ([place])    Chronic renal disease, stage IV ([place]) 02/19/2013   Colon polyps    Depression    Diabetic ulcer of right foot ([place])    DVT (deep venous thrombosis) ([place]) 09/2002   patient reports additional DVTs in '06 & '11 (unconfirmed)   ED (erectile dysfunction)    GERD (gastroesophageal reflux disease)    History of blood transfusion    "related to OR" (10/31/2016)   Hyperlipidemia 02/19/2013   Hypertension    Nausea & vomiting    "constant for the last couple weeks" (10/31/2016)   Nephrotic syndrome    Obesity    BMI 44, weight 346 pounds 01/30/14   OSA (obstructive sleep apnea) 02/28/2018   Mild obstructive sleep apnea with an AHI of 9.8/h but severe during REM sleep with an AHI of 33.8/h.  Oxygen saturations dropped to 86% and there was moderate snoring   Peripheral edema    Pneumonia    Prosthesis adjustment 08/17/2016   Pulmonary embolism ([place]) 09/2002   treated with 6 months  of warfarin   Pyelonephritis 02/02/2018   Type I diabetes mellitus ([place]) dx'd 2001   Urinary retention    Past Surgical History:  Procedure Laterality Date   AMPUTATION Right 12/22/2013   Procedure: AMPUTATION BELOW KNEE;  Surgeon: [name], MD;  Location: [place];  Service: Orthopedics;  Laterality: Right;   AMPUTATION Right 02/19/2014   Procedure: RIGHT ABOVE KNEE AMPUTATION ;  Surgeon: [name], MD;  Location: [place];  Service: Orthopedics;  Laterality: Right;   blood clot removal  2016   CARDIAC CATHETERIZATION  02/18/2013   normal coronaries   ESOPHAGOGASTRODUODENOSCOPY N/A 11/02/2016   Procedure: ESOPHAGOGASTRODUODENOSCOPY (EGD);  Surgeon: [name], MD;  Location: [facility] ENDOSCOPY;  Service: Endoscopy;  Laterality: N/A;   I & D EXTREMITY Right 12/14/2013   Procedure: IRRIGATION AND DEBRIDEMENT RIGHT FOOT;  Surgeon: [name], MD;  Location: [place];  Service: Orthopedics;  Laterality: Right;   INCISION AND DRAINAGE ABSCESS  2007; 2015   "back"   INCISION AND DRAINAGE OF WOUND Right 12/22/2013   Procedure: I&D RIGHT BUTTOCK;  Surgeon: [name], MD;  Location: [place];  Service: Orthopedics;  Laterality: Right;   LEFT HEART CATHETERIZATION WITH CORONARY ANGIOGRAM N/A  02/18/2013   Procedure: LEFT HEART CATHETERIZATION WITH CORONARY ANGIOGRAM;  Surgeon: [name], MD;  Location: [facility] CATH LAB;  Service: Cardiovascular;  Laterality: N/A;   Patient Active Problem List   Diagnosis Date Noted   Diabetic neuropathy ([place]) 01/24/2022   Trapezius strain, left, initial encounter 02/02/2021   Left shoulder pain 12/30/2020   Acute pulmonary edema ([facility])    Benign paroxysmal positional vertigo 09/14/2020   Anemia associated with chronic renal failure    Hemoglobinuria 12/12/2018   OSA (obstructive sleep apnea) 02/28/2018   Chronic diastolic heart failure ([place]) 02/02/2018   Preventative health care 08/15/2017   Erectile dysfunction 04/22/2017   History of pulmonary embolus (PE)  06/09/2016   Depression 05/04/2016   Hypogonadism in male 05/04/2016   Urinary retention 05/04/2016   Dyslipidemia 08/12/2014   Unilateral AKA, right ([place]) 12/26/2013   Hyperlipidemia 02/19/2013   S/P cardiac cath, 02/18/13, normal coronaries 02/19/2013   Chronic renal insufficiency, stage 3 (moderate) ([place]) 02/19/2013   Hypertension 07/24/2011   Long term current use of anticoagulant therapy 07/19/2011   History of DVT (deep vein thrombosis) 07/28/2009   Morbid obesity ([place]) 10/26/2005   Insulin dependent type 2 diabetes mellitus, controlled ([place]) 01/09/2002    ONSET DATE: 09/15/2021 (referral date)  REFERRING DIAG: [id number] (ICD-10-CM) - Unilateral AKA, right ([place])  THERAPY DIAG:  Other abnormalities of gait and mobility  Muscle weakness (generalized)  Unsteadiness on feet  Rationale for Evaluation and Treatment: Rehabilitation  SUBJECTIVE:   SUBJECTIVE STATEMENT: Patient arrives to session and 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 Requests to work on mat level exercises in today's session. Patient reports that he went to [facility] and he got some inserts put into his socket under plastic liner and it is helping the socket fit better.   Pt accompanied by: self-came via transportation  PERTINENT HISTORY: Insulin dependent DM2, hyperlipidemia, right AKA, chronic diastolic heart failure, HTN, BPPV  PAIN:  Are you having pain? No  PRECAUTIONS: Fall  WEIGHT BEARING RESTRICTIONS: No  FALLS: Has patient fallen in last 6 months? No  LIVING ENVIRONMENT: Lives with: lives alone Lives in: House/apartment Home Access: Level entry Home layout: One level Stairs: No Has following equipment at home: Lobbyist, Environmental consultant - 2 wheeled, Wheelchair (manual), Electronics engineer, and Grab bars  PLOF: Independent with household mobility with device, Independent with community mobility with device, Independent with transfers, and Needs assistance with  homemaking  PATIENT GOALS: "To learn how to walk with this (gestures to prosthetic) effectively, build my endurance and get out of this chair!"  OBJECTIVE:  Vitals:   02/20/22 0805  BP: (!) 148/79  Pulse: 83    Resting blood glucose: 106  DIAGNOSTIC FINDINGS: No recent relevant imaging.1   CURRENT PROSTHETIC WEAR ASSESSMENT: Patient is independent with: skin check, residual limb care, care of non-amputated limb, prosthetic cleaning, ply sock cleaning, correct ply sock adjustment, proper wear schedule/adjustment, and proper weight-bearing schedule/adjustment Patient is dependent with:  N/A Donning prosthesis: Complete Independence Doffing prosthesis: Complete Independence Prosthetic wear tolerance: 4-8 hours/day, 7 days/week Prosthetic weight bearing tolerance: roughly 5 minutes Edema: pt reports stable limb edema Residual limb condition: Pt reports no open wounds, prefers to not doff liner. Prosthetic description: TF lanyard w/ [place] HD 4-bar mechanical knee (w/ manual lock) - pt states knee is hydraulic K code/activity level with prosthetic use: Level 2 *Remains unchanged 02/02/2022  TODAY'S TREATMENT:  Blood Glucose: 147 mmHg  Transfer to low mat with scooting and adequate wheel chair clearance to R on/ off mat with AD for transfer onto mat and with prosthetic doffed for transfer back and SBA  Sustained hip flexor stretch with blue bolster x 10 min prone as stretch between reps Modified plank 1 x 60",  1 x 29", 1 x 62", 1 x 77" with prosthetic doffed prone on elbows and bolster under L knee and under R residual limb Superman's alt arm and leg prone on mat with prosthetic doffed 3 x 10 bil Dead bug with green theraband performing all repitions on 1 side at a time on 2 sets 15-20reps with prosthetic doffed (attached to LLE via limb lanyard)   Patient reports loving this exercise and excited to do at home; band snaps back x1 due to sliding up on L leg Bridging with bilateral LE on round bolster 2 x 10 (reduced clearance of glutes, required mod cues for eccentric control to lower)  PATIENT EDUCATION: Education details: Updated HEP Person educated: Patient Education method: Theatre stage manager Education comprehension: verbalized understanding  HOME EXERCISE PROGRAM:  - Dead bug with green theraband 2 sets x 15 reps bil  Use of FWW + prosthetic leg with sit to stands during gym sessions in mornings  Printed for patient: -STS (use counter support if needed vs RW) -Standing march (kicking RLE straight) -Standing wide stance w/ counter support progressing to no UE support (use chair behind) *PT forgot to save to patient instructions following printing.  URL: [email or web address] Date: 01/19/2022 Prepared by: Malachi Carl  Exercises - Sidelying Hip Abduction (AKA)  - 1 x daily - 7 x weekly - 3 sets - 10 reps - Prone Hip Extension with Residual Limb (AKA)  - 1 x daily - 7 x weekly - 3 sets - 10 reps - Supine Bridge with Bilateral LE and Bolster for RLE (BKA)  - 1 x daily - 7 x weekly - 3 sets - 10 reps  Access Code: [email or web address] URL: [email or web address] Date: 01/26/2022 Prepared by: Malachi Carl  Exercises - Seated Calf Stretch with Strap  - 1 x daily - 7 x weekly - 3 sets - 30" hold - Prone Lying with Towel Roll (Hip Flexor Stretch) (BKA)  - 1 x daily - 7 x weekly - 1 sets - 5' hold - Prone Quadriceps Stretch with Strap  - 1 x daily - 7 x weekly - 3 sets - 30" hold   ASSESSMENT:  CLINICAL IMPRESSION: Session emphasized hip, glute, and core strengthening via mat level exercises. Patient tolerated session well. Continues to demonstrate reduced glute clearance during bridging. Patient also enjoys addition of dead bugs, which were added to HEP. Recommend continued skilled physical therapy  services to address impairments, improve function, and progress towards goals. Continue POC.   OBJECTIVE IMPAIRMENTS: Abnormal gait, decreased activity tolerance, decreased balance, decreased endurance, decreased mobility, difficulty walking, decreased ROM, decreased strength, improper body mechanics, prosthetic dependency , and obesity.   ACTIVITY LIMITATIONS: carrying, lifting, bending, standing, squatting, stairs, transfers, and locomotion level  PARTICIPATION LIMITATIONS: meal prep, cleaning, laundry, and driving  PERSONAL FACTORS: Fitness, Time since onset of injury/illness/exacerbation, Transportation, and 1-2 comorbidities: insulin dependent DM2 and HTN  are also affecting patient's functional outcome.   REHAB POTENTIAL: Good  CLINICAL DECISION MAKING: Evolving/moderate complexity  EVALUATION COMPLEXITY: Moderate   GOALS: Goals reviewed with patient? Yes  SHORT TERM GOALS: Target date: 01/27/2022  Pt will be independent with strength and balance HEP  to maintain progress. Baseline: Provided 01/19/2022; Met on 01/24/2022 Goal status: MET  2.  Pt will decrease 5xSTS to </=13 seconds w/ use of only one hand on walker for support in order to demonstrate decreased risk for falls and improved functional bilateral LE strength and power. Baseline: 17.31 sec w/ BUE on RW; 01/24/2022 16.12 sec with BU in //; ' 32" with RUE support in // bars; 11.78" sec w/ BUE support in // bars; 13.98" with RUE support in // bars on 02/10/2022 Goal status: IN PROGRESS  3. Patient will improve 2MWT score to 35 meters to demonstrate improvement in aerobic capacity and endurance needed for ambulating in the community.   Baseline: 28.8 m with SBA with RW and wheel chair follow; 18 meters with SBA w/ RW but increased soreness impacting patient this session 01/24/2022;  115 feet or 92mat 4/10 RPE at end with use of FWW + SBA and wheel chair follow on 02/10/2022 Goal status: MET  4.  Patient will improve gait speed  to 0.35 m/s or greater with LRAD to indicate an improvement from being a household ambulator to limited community ambulator.   Baseline: 0.26 m/s with RW + SBA; 0.17 m/s with RW + SBA; 42" or 0.24 m/s with FWW + SBA on 02/10/2022 Goal status: IN PROGRESS  5.  Patient will demonstrate ability to stand unsupported with wide base gait for 2 minutes without LOB to improve static stability needed for safety within home setting. Baseline: Discontinued Berg as not appropriate for patient at this time. Revised goal to standing balance/component Berg goal; initial on 01/05/2022 5-52" trials with feet approximately 1.5 feet apart; improved to 62" on 01/24/2022; Static standing balance without UE support with WBOS x 15" (SBA) on 02/10/2022 Goal status: NOT MET  LONG TERM GOALS: Target date: 02/24/2022  Pt will be independent with finalized strengthening HEP for use in the gym at to maintain progress at home. Baseline: To be established. Goal status: INITIAL  2.   Patient will improve 2MWT score to 80 meters to demonstrate improvement in aerobic capacity and endurance needed for ambulating in the community.   Baseline: 28.8 m with SBA with RW and wheel chair follow; 18 meters with SBA w/ RW but increased soreness impacting patient this session 01/24/2022; 115 feet or 361mt 4/10 RPE at end with use of FWW + SBA and wheel chair follow on 02/10/2022 Goal status: IN PROGRESS  3. Patient will improve gait speed to 0.6 m/s to indicate improvement to the level of limited community ambulator in order to participate more easily in activities outside of the home.    Baseline:  0.26 m/s with RW + SBA;  0.17 m/s with RW + SBA; 42" or 0.24 m/s with FWW + SBA Goal status: IN PROGRESS  4.   Patient will demonstrate ability to stand unsupported with wide base gait for 2 minutes without LOB to improve static stability needed for safety within home setting. Baseline: Discontinued Berg as not appropriate for patient at this time.  Revised goal to standing balance/component Berg goal; initial on 01/05/2022 5-52" trials with feet approximately 1.5 feet apart; improved to 62" on 01/24/2022; Static standing balance without UE support with WBOS x 15" (SBA) on 02/10/2022 Goal status: REVISED  PLAN:  PT FREQUENCY: 2x/week  PT DURATION: 8 weeks  PLANNED INTERVENTIONS: Therapeutic exercises, Therapeutic activity, Neuromuscular re-education, Balance training, Gait training, Patient/Family education, Self Care, Joint mobilization, Stair training, Vestibular training, Prosthetic training, DME instructions, Manual therapy,  and Re-evaluation  PLAN FOR NEXT SESSION recumbent bike, pt wants to work towards elliptical-edu on safe and realistic goals as pt progresses, pt would like to work towards treadmill-overground tolerance and working on step up efficiency to access the treadmill would likely be best initially?, leg press-pt request, upright posture, standing balance, endurance, core strength, med ball slams-pt is resistant to using 10 lb med ball as he typically uses 50 lb at gym, modified SLS tasks; trial treadmill with assist x 2, unilateral support reaching all directions (forward/laterally/to floor/crossbody), forward T w/ unilateral support (kick prosthetic back); RECERT   Malachi Carl, PT, DPT 02/20/2022, 8:59 AM

## 2022-02-21 ENCOUNTER — Ambulatory Visit (HOSPITAL_COMMUNITY)
Admission: RE | Admit: 2022-02-21 | Discharge: 2022-02-21 | Disposition: A | Discharge status: Home / Self Care | Payer: Medicare Other | Source: Ambulatory Visit | Attending: Nephrology | Admitting: Nephrology

## 2022-02-21 ENCOUNTER — Ambulatory Visit: Payer: Medicare Other | Admitting: Physical Therapy

## 2022-02-21 VITALS — BP 142/59 | HR 77 | Temp 97.8°F | Resp 18

## 2022-02-21 DIAGNOSIS — N183 Chronic kidney disease, stage 3 unspecified: Secondary | ICD-10-CM | POA: Diagnosis not present

## 2022-02-21 LAB — RENAL FUNCTION PANEL
Albumin: 3.7 g/dL (ref 3.5–5.0)
Anion gap: 12 (ref 5–15)
BUN: 89 mg/dL — ABNORMAL HIGH (ref 6–20)
CO2: 25 mmol/L (ref 22–32)
Calcium: 9.4 mg/dL (ref 8.9–10.3)
Chloride: 98 mmol/L (ref 98–111)
Creatinine, Ser: 3.92 mg/dL — ABNORMAL HIGH (ref 0.61–1.24)
GFR, Estimated: 18 mL/min — ABNORMAL LOW (ref >60)
Glucose, Bld: 61 mg/dL — ABNORMAL LOW (ref 70–99)
Phosphorus: 4.6 mg/dL (ref 2.5–4.6)
Potassium: 3.4 mmol/L — ABNORMAL LOW (ref 3.5–5.1)
Sodium: 135 mmol/L (ref 135–145)

## 2022-02-21 LAB — IRON AND TIBC
Iron: 59 ug/dL (ref 45–182)
Saturation Ratios: 19 % (ref 17.9–39.5)
TIBC: 304 ug/dL (ref 250–450)
UIBC: 245 ug/dL

## 2022-02-21 LAB — POCT HEMOGLOBIN-HEMACUE: Hemoglobin: 12.6 g/dL — ABNORMAL LOW (ref 13.0–17.0)

## 2022-02-21 LAB — FERRITIN: Ferritin: 379 ng/mL — ABNORMAL HIGH (ref 24–336)

## 2022-02-21 MED ORDER — DARBEPOETIN ALFA 200 MCG/0.4ML IJ SOSY: 200.0000 ug | PREFILLED_SYRINGE | INTRAMUSCULAR | Status: DC

## 2022-02-22 LAB — PTH, INTACT AND CALCIUM
Calcium, Total (PTH): 9.6 mg/dL (ref 8.7–10.2)
PTH: 211 pg/mL — ABNORMAL HIGH (ref 15–65)

## 2022-02-23 ENCOUNTER — Ambulatory Visit: Payer: Medicare Other | Admitting: Physical Therapy

## 2022-02-23 ENCOUNTER — Encounter: Payer: Self-pay | Admitting: Physical Therapy

## 2022-02-23 DIAGNOSIS — R2689 Other abnormalities of gait and mobility: Secondary | ICD-10-CM | POA: Diagnosis not present

## 2022-02-23 DIAGNOSIS — R2681 Unsteadiness on feet: Secondary | ICD-10-CM | POA: Diagnosis not present

## 2022-02-23 DIAGNOSIS — M6281 Muscle weakness (generalized): Secondary | ICD-10-CM | POA: Diagnosis not present

## 2022-02-23 NOTE — Therapy (Signed)
OUTPATIENT PHYSICAL THERAPY PROSTHETICS TREATMENT/RE-CERT   Patient Name: [name] MRN: [id number] DOB:[birth year], 47 y.o., male Today's Date: 02/23/2022  PCP: Starlyn Skeans, MD REFERRING PROVIDER: Charise Killian, MD  END OF SESSION:  02/23/22 0936  PT Visits / Re-Eval  Visit Number 13  Number of Visits 25 (17 + 8)  Date for PT Re-Evaluation 0000000 (re-cert on [id number])  Authorization  Authorization Type MEDICARE PART A AND B  PT Time Calculation  PT Start Time 0931  PT Stop Time 1017  PT Time Calculation (min) 46 min  PT - End of Session  Equipment Utilized During Treatment Other (comment);Gait belt  Activity Tolerance Patient limited by fatigue  Behavior During Therapy [facility] for tasks assessed/performed     Past Medical History:  Diagnosis Date   Acute venous embolism and thrombosis of deep vessels of proximal lower extremity ([name]) 07/19/2011   Anemia    Anginal pain ([place])    pt denies   Anxiety    Chest pain, neg MI, normal coronaries by cath 02/18/2013   pt denies   CHF (congestive heart failure) (HCC)    Chronic renal disease, stage IV ([place]) 02/19/2013   Colon polyps    Depression    Diabetic ulcer of right foot ([place])    DVT (deep venous thrombosis) ([place]) 09/2002   patient reports additional DVTs in '06 & '11 (unconfirmed)   ED (erectile dysfunction)    GERD (gastroesophageal reflux disease)    History of blood transfusion    "related to OR" (10/31/2016)   Hyperlipidemia 02/19/2013   Hypertension    Nausea & vomiting    "constant for the last couple weeks" (10/31/2016)   Nephrotic syndrome    Obesity    BMI 44, weight 346 pounds 01/30/14   OSA (obstructive sleep apnea) 02/28/2018   Mild obstructive sleep apnea with an AHI of 9.8/h but severe during REM sleep with an AHI of 33.8/h.  Oxygen saturations dropped to 86% and there was moderate snoring   Peripheral edema    Pneumonia    Prosthesis adjustment 08/17/2016   Pulmonary embolism ([place]) 09/2002    treated with 6 months of warfarin   Pyelonephritis 02/02/2018   Type I diabetes mellitus ([place]) dx'd 2001   Urinary retention    Past Surgical History:  Procedure Laterality Date   AMPUTATION Right 12/22/2013   Procedure: AMPUTATION BELOW KNEE;  Surgeon: [name], MD;  Location: [place];  Service: Orthopedics;  Laterality: Right;   AMPUTATION Right 02/19/2014   Procedure: RIGHT ABOVE KNEE AMPUTATION ;  Surgeon: [name], MD;  Location: [place];  Service: Orthopedics;  Laterality: Right;   blood clot removal  2016   CARDIAC CATHETERIZATION  02/18/2013   normal coronaries   ESOPHAGOGASTRODUODENOSCOPY N/A 11/02/2016   Procedure: ESOPHAGOGASTRODUODENOSCOPY (EGD);  Surgeon: [name], MD;  Location[facility] ENDOSCOPY;  Service: Endoscopy;  Laterality: N/A;   I & D EXTREMITY Right 12/14/2013   Procedure: IRRIGATION AND DEBRIDEMENT RIGHT FOOT;  Surgeon: [name], MD;  Location: [place];  Service: Orthopedics;  Laterality: Right;   INCISION AND DRAINAGE ABSCESS  2007; 2015   "back"   INCISION AND DRAINAGE OF WOUND Right 12/22/2013   Procedure: I&D RIGHT BUTTOCK;  Surgeon: [name], MD;  Location: [place];  Service: Orthopedics;  Laterality: Right;   LEFT HEART CATHETERIZATION WITH CORONARY ANGIOGRAM N/A 02/18/2013   Procedure: LEFT HEART CATHETERIZATION WITH CORONARY ANGIOGRAM;  Surgeon: [name], MD;  Location: [facility] CATH LAB;  Service: Cardiovascular;  Laterality: N/A;   Patient Active Problem List   Diagnosis Date Noted   Diabetic neuropathy ([place]) 01/24/2022   Trapezius strain, left, initial encounter 02/02/2021   Left shoulder pain 12/30/2020   Acute pulmonary edema (HCC)    Benign paroxysmal positional vertigo 09/14/2020   Anemia associated with chronic renal failure    Hemoglobinuria 12/12/2018   OSA (obstructive sleep apnea) 02/28/2018   Chronic diastolic heart failure ([place]) 02/02/2018   Preventative health care 08/15/2017   Erectile dysfunction 04/22/2017   History of  pulmonary embolus (PE) 06/09/2016   Depression 05/04/2016   Hypogonadism in male 05/04/2016   Urinary retention 05/04/2016   Dyslipidemia 08/12/2014   Unilateral AKA, right ([place]) 12/26/2013   Hyperlipidemia 02/19/2013   S/P cardiac cath, 02/18/13, normal coronaries 02/19/2013   Chronic renal insufficiency, stage 3 (moderate) ([place]) 02/19/2013   Hypertension 07/24/2011   Long term current use of anticoagulant therapy 07/19/2011   History of DVT (deep vein thrombosis) 07/28/2009   Morbid obesity ([place]) 10/26/2005   Insulin dependent type 2 diabetes mellitus, controlled ([place]) 01/09/2002    ONSET DATE: 09/15/2021 (referral date)  REFERRING DIAG: [id number] (ICD-10-CM) - Unilateral AKA, right ([place])  THERAPY DIAG:  Other abnormalities of gait and mobility  Muscle weakness (generalized)  Unsteadiness on feet  Rationale for Evaluation and Treatment: Rehabilitation  SUBJECTIVE:   SUBJECTIVE STATEMENT: Patient arrives to session following gym this morning.  Blood sugar is 131, he states he is doing well and denies falls.   Pt accompanied by: self-came via transportation  PERTINENT HISTORY: Insulin dependent DM2, hyperlipidemia, right AKA, chronic diastolic heart failure, HTN, BPPV  PAIN:  Are you having pain? No  PRECAUTIONS: Fall  WEIGHT BEARING RESTRICTIONS: No  FALLS: Has patient fallen in last 6 months? No  LIVING ENVIRONMENT: Lives with: lives alone Lives in: House/apartment Home Access: Level entry Home layout: One level Stairs: No Has following equipment at home: Lobbyist, Environmental consultant - 2 wheeled, Wheelchair (manual), Electronics engineer, and Grab bars  PLOF: Independent with household mobility with device, Independent with community mobility with device, Independent with transfers, and Needs assistance with homemaking  PATIENT GOALS: "To learn how to walk with this (gestures to prosthetic) effectively, build my endurance and get out of this chair!"  OBJECTIVE:   There were no vitals filed for this visit.   Resting blood glucose: 131  DIAGNOSTIC FINDINGS: No recent relevant imaging.1   CURRENT PROSTHETIC WEAR ASSESSMENT: Patient is independent with: skin check, residual limb care, care of non-amputated limb, prosthetic cleaning, ply sock cleaning, correct ply sock adjustment, proper wear schedule/adjustment, and proper weight-bearing schedule/adjustment Patient is dependent with:  N/A Donning prosthesis: Complete Independence Doffing prosthesis: Complete Independence Prosthetic wear tolerance: 4-8 hours/day, 7 days/week Prosthetic weight bearing tolerance: roughly 5 minutes Edema: pt reports stable limb edema Residual limb condition: Pt reports no open wounds, prefers to not doff liner. Prosthetic description: TF lanyard w/ [place] HD 4-bar mechanical knee (w/ manual lock) - pt states knee is hydraulic K code/activity level with prosthetic use: Level 2 *Remains unchanged 02/02/2022  TODAY'S TREATMENT:  Blood Glucose: 131   -Verbally reviewed HEP, pt has been compliant with gym regimen as well as most of HEP consistently, he admits he has not done the standing march in a while or the calf stretch, he requests to physically review prone quad stretch.  Reviewed quad stretch 3x45 seconds w/ strap-provided resources for OOP purchase of strap to increase compliance at home. -Discussion of re-cert and decreased frequencyw/ encouragement of continuing endurance work outside clinic w/ more intense focus on gait training during limited session time.  Pt has previously been limited by severe fatigue from frequency and gym routine as well as bottoming out of blood sugar following morning gym sessions as schedule permits. -Standing wide stance unsupported: Trial 1: 2.03 seconds Trial 2:  11.03 seconds Trial 3:  25.75  seconds -Pt inquires about gym equipment funding/donations, discussed reaching out to [employer] for further resources.  Printed info. -10MWT w/ RW:  27.00 seconds = 0.37 m/sec OR 1.22 ft/sec (w/c follow) -2MWT w/ RW and w/c follow = 115' or 73m PATIENT EDUCATION: Education details: Reviewed HEP. Person educated: Patient Education method: ETheatre stage managerEducation comprehension: verbalized understanding  HOME EXERCISE PROGRAM:  - Dead bug with green theraband 2 sets x 15 reps bil  Use of FWW + prosthetic leg with sit to stands during gym sessions in mornings  Printed for patient: -STS (use counter support if needed vs RW) -Standing march (kicking RLE straight) -Standing wide stance w/ counter support progressing to no UE support (use chair behind) *PT forgot to save to patient instructions following printing.  URL: [email or web address] Date: 01/19/2022 Prepared by: SMalachi Carl Exercises - Sidelying Hip Abduction (AKA)  - 1 x daily - 7 x weekly - 3 sets - 10 reps - Prone Hip Extension with Residual Limb (AKA)  - 1 x daily - 7 x weekly - 3 sets - 10 reps - Supine Bridge with Bilateral LE and Bolster for RLE (BKA)  - 1 x daily - 7 x weekly - 3 sets - 10 reps  Access Code: [email or web address]: [email or web address] Date: 01/26/2022 Prepared by: SMalachi Carl Exercises - Seated Calf Stretch with Strap  - 1 x daily - 7 x weekly - 3 sets - 30" hold - Prone Lying with Towel Roll (Hip Flexor Stretch) (BKA)  - 1 x daily - 7 x weekly - 1 sets - 5' hold - Prone Quadriceps Stretch with Strap  - 1 x daily - 7 x weekly - 3 sets - 30" hold   ASSESSMENT:  CLINICAL IMPRESSION: Assessed LTGs this visit w/ focus on re-certification for continued progress.  Pt progress to this point has been modest, but his endurance continues to improve and he remains highly motivated in and outside the clinic.  His 2MWT tolerance remains 115' or 35 meters total.  He  can stand unsupported for roughly 26 seconds and he remains highly compliant to his established HEP and would benefit from advancement in future sessions.  His gait speed is 0.37 m/sec using a front-wheeled walker.  He continues to benefit from skilled PT to progress overall upright tolerance to promote further prosthetic management and independence.  OBJECTIVE IMPAIRMENTS: Abnormal gait, decreased activity tolerance, decreased balance, decreased endurance, decreased mobility, difficulty walking, decreased ROM, decreased strength, improper body mechanics, prosthetic dependency , and obesity.   ACTIVITY LIMITATIONS: carrying, lifting, bending, standing, squatting, stairs, transfers, and locomotion level  PARTICIPATION LIMITATIONS: meal prep, cleaning, laundry, and driving  PERSONAL FACTORS: Fitness, Time since onset  of injury/illness/exacerbation, Transportation, and 1-2 comorbidities: insulin dependent DM2 and HTN  are also affecting patient's functional outcome.   REHAB POTENTIAL: Good  CLINICAL DECISION MAKING: Evolving/moderate complexity  EVALUATION COMPLEXITY: Moderate   OLD GOALS: Goals reviewed with patient? Yes  SHORT TERM GOALS: Target date: 01/27/2022  Pt will be independent with strength and balance HEP to maintain progress. Baseline: Provided 01/19/2022; Met on 01/24/2022 Goal status: MET  2.  Pt will decrease 5xSTS to </=13 seconds w/ use of only one hand on walker for support in order to demonstrate decreased risk for falls and improved functional bilateral LE strength and power. Baseline: 17.31 sec w/ BUE on RW; 01/24/2022 16.12 sec with BU in //; ' 32" with RUE support in // bars; 11.78" sec w/ BUE support in // bars; 13.98" with RUE support in // bars on 02/10/2022 Goal status: IN PROGRESS  3. Patient will improve 2MWT score to 35 meters to demonstrate improvement in aerobic capacity and endurance needed for ambulating in the community.   Baseline: 28.8 m with SBA with RW  and wheel chair follow; 18 meters with SBA w/ RW but increased soreness impacting patient this session 01/24/2022;  115 feet or 62mat 4/10 RPE at end with use of FWW + SBA and wheel chair follow on 02/10/2022 Goal status: MET  4.  Patient will improve gait speed to 0.35 m/s or greater with LRAD to indicate an improvement from being a household ambulator to limited community ambulator.   Baseline: 0.26 m/s with RW + SBA; 0.17 m/s with RW + SBA; 42" or 0.24 m/s with FWW + SBA on 02/10/2022 Goal status: IN PROGRESS  5.  Patient will demonstrate ability to stand unsupported with wide base gait for 2 minutes without LOB to improve static stability needed for safety within home setting. Baseline: Discontinued Berg as not appropriate for patient at this time. Revised goal to standing balance/component Berg goal; initial on 01/05/2022 5-52" trials with feet approximately 1.5 feet apart; improved to 62" on 01/24/2022; Static standing balance without UE support with WBOS x 15" (SBA) on 02/10/2022 Goal status: NOT MET  LONG TERM GOALS: Target date: 02/24/2022  Pt will be independent with finalized strengthening HEP for use in the gym at to maintain progress at home. Baseline: Reviewed and pt compliant (2/15) Goal status: MET  2.   Patient will improve 2MWT score to 80 meters to demonstrate improvement in aerobic capacity and endurance needed for ambulating in the community.   Baseline: 28.8 m with SBA with RW and wheel chair follow; 18 meters with SBA w/ RW but increased soreness impacting patient this session 01/24/2022; 115 feet or 334mt 4/10 RPE at end with use of FWW + SBA and wheel chair follow on 02/10/2022; 115' OR 3565m/15) Goal status: NOT MET  3. Patient will improve gait speed to 0.6 m/s to indicate improvement to the level of limited community ambulator in order to participate more easily in activities outside of the home.    Baseline:  0.26 m/s with RW + SBA;  0.17 m/s with RW + SBA; 42" or 0.24 m/s  with FWW + SBA; 0.37 m/sec w/ RW (2/15) Goal status: NOT MET  4.   Patient will demonstrate ability to stand unsupported with wide base gait for 2 minutes without LOB to improve static stability needed for safety within home setting. Baseline: Discontinued Berg as not appropriate for patient at this time. Revised goal to standing balance/component Berg goal; initial on  01/05/2022 5-52" trials with feet approximately 1.5 feet apart; improved to 62" on 01/24/2022; Static standing balance without UE support with WBOS x 15" (SBA) on 02/10/2022; 25.75 seconds (2/15) Goal status: NOT MET  UPDATED GOALS: Goals reviewed with patient? Yes  SHORT TERM GOALS: Target date: 03/24/2022  1.  Pt will decrease 5xSTS to </=13 seconds w/ use of only one hand on walker for support in order to demonstrate decreased risk for falls and improved functional bilateral LE strength and power. Baseline:  13.98" with RUE support in // bars on 02/10/2022 Goal status: INITIAL  2. Patient will improve 2MWT score to 45 meters to demonstrate improvement in aerobic capacity and endurance needed for ambulating in the community.   Baseline: 115' OR 16m(2/15) Goal status: INITIAL  3.  Patient will improve gait speed to 0.45 m/s or greater with LRAD to indicate an improvement from being a household ambulator to limited community ambulator.   Baseline: 0.37 m/sec w/ RW (2/15) Goal status: INITIAL  4.  Patient will demonstrate ability to stand unsupported with wide base gait for at least 1 minute without LOB to improve static stability needed for safety within home setting. Baseline: 25.75 seconds (2/15) Goal status: INITIAL  LONG TERM GOALS: Target date: 04/21/2022  Pt will be independent with finalized and advanced strengthening HEP for use in the gym at to maintain progress at home. Baseline: Reviewed and pt compliant, needs advancement to standing endurance/balance (2/15) Goal status: INITIAL  2.   Patient will improve 2MWT  score to 55 meters to demonstrate improvement in aerobic capacity and endurance needed for ambulating in the community.   Baseline: 115' OR 39m2/15) Goal status:  INITIAL  3. Patient will improve gait speed to 0.6 m/s to indicate improvement to the level of limited community ambulator in order to participate more easily in activities outside of the home.    Baseline:  0.37 m/sec w/ RW (2/15) Goal status: INITIAL  4.   Patient will demonstrate ability to stand unsupported with wide base gait for at least 2 minutes without LOB to improve static stability needed for safety within home setting. Baseline:  25.75 seconds (2/15) Goal status: INITIAL  PLAN:  PT FREQUENCY: 1x/week  PT DURATION: 8 weeks  PLANNED INTERVENTIONS: Therapeutic exercises, Therapeutic activity, Neuromuscular re-education, Balance training, Gait training, Patient/Family education, Self Care, Joint mobilization, Stair training, Vestibular training, Prosthetic training, DME instructions, Manual therapy, and Re-evaluation  PLAN FOR NEXT SESSION recumbent bike, pt wants to work towards elliptical-edu on safe and realistic goals as pt progresses, pt would like to work towards treadmill-overground tolerance and working on step up efficiency to access the treadmill would likely be best initially?, leg press-pt request, upright posture, standing balance, endurance, core strength, med ball slams-pt is resistant to using 10 lb med ball as he typically uses 50 lb at gym, modified SLS tasks; trial treadmill with assist x 2, unilateral support reaching all directions (forward/laterally/to floor/crossbody), forward T w/ unilateral support (kick prosthetic back)  MaElease EtiennePT, DPT 02/23/2022, 10:21 AM

## 2022-02-28 ENCOUNTER — Encounter: Payer: Self-pay | Admitting: Physical Therapy

## 2022-02-28 ENCOUNTER — Ambulatory Visit: Payer: Medicare Other | Admitting: Physical Therapy

## 2022-02-28 DIAGNOSIS — M6281 Muscle weakness (generalized): Secondary | ICD-10-CM

## 2022-02-28 DIAGNOSIS — R2681 Unsteadiness on feet: Secondary | ICD-10-CM | POA: Diagnosis not present

## 2022-02-28 DIAGNOSIS — R2689 Other abnormalities of gait and mobility: Secondary | ICD-10-CM

## 2022-02-28 NOTE — Therapy (Unsigned)
OUTPATIENT PHYSICAL THERAPY PROSTHETICS TREATMENT/RE-CERT   Patient Name: [name] MRN: [id number] DOB:[birth year], 47 y.o., male Today's Date: 02/28/2022  PCP: Starlyn Skeans, MD REFERRING PROVIDER: Charise Killian, MD  END OF SESSION:  02/23/22 0936  PT Visits / Re-Eval  Visit Number 13  Number of Visits 25 (17 + 8)  Date for PT Re-Evaluation 0000000 (re-cert on [id number])  Authorization  Authorization Type MEDICARE PART A AND B  PT Time Calculation  PT Start Time 0931  PT Stop Time 1017  PT Time Calculation (min) 46 min  PT - End of Session  Equipment Utilized During Treatment Other (comment);Gait belt  Activity Tolerance Patient limited by fatigue  Behavior During Therapy [facility] for tasks assessed/performed     Past Medical History:  Diagnosis Date   Acute venous embolism and thrombosis of deep vessels of proximal lower extremity ([place]) 07/19/2011   Anemia    Anginal pain ([place])    pt denies   Anxiety    Chest pain, neg MI, normal coronaries by cath 02/18/2013   pt denies   CHF (congestive heart failure) ([place])    Chronic renal disease, stage IV ([place]) 02/19/2013   Colon polyps    Depression    Diabetic ulcer of right foot ([place])    DVT (deep venous thrombosis) ([place]) 09/2002   patient reports additional DVTs in '06 & '11 (unconfirmed)   ED (erectile dysfunction)    GERD (gastroesophageal reflux disease)    History of blood transfusion    "related to OR" (10/31/2016)   Hyperlipidemia 02/19/2013   Hypertension    Nausea & vomiting    "constant for the last couple weeks" (10/31/2016)   Nephrotic syndrome    Obesity    BMI 44, weight 346 pounds 01/30/14   OSA (obstructive sleep apnea) 02/28/2018   Mild obstructive sleep apnea with an AHI of 9.8/h but severe during REM sleep with an AHI of 33.8/h.  Oxygen saturations dropped to 86% and there was moderate snoring   Peripheral edema    Pneumonia    Prosthesis adjustment 08/17/2016   Pulmonary embolism ([place]) 09/2002    treated with 6 months of warfarin   Pyelonephritis 02/02/2018   Type I diabetes mellitus ([place]) dx'd 2001   Urinary retention    Past Surgical History:  Procedure Laterality Date   AMPUTATION Right 12/22/2013   Procedure: AMPUTATION BELOW KNEE;  Surgeon: [name], MD;  Location: [place];  Service: Orthopedics;  Laterality: Right;   AMPUTATION Right 02/19/2014   Procedure: RIGHT ABOVE KNEE AMPUTATION ;  Surgeon: [name], MD;  Location: [place];  Service: Orthopedics;  Laterality: Right;   blood clot removal  2016   CARDIAC CATHETERIZATION  02/18/2013   normal coronaries   ESOPHAGOGASTRODUODENOSCOPY N/A 11/02/2016   Procedure: ESOPHAGOGASTRODUODENOSCOPY (EGD);  Surgeon: [name], MD;  Location: [facility] ENDOSCOPY;  Service: Endoscopy;  Laterality: N/A;   I & D EXTREMITY Right 12/14/2013   Procedure: IRRIGATION AND DEBRIDEMENT RIGHT FOOT;  Surgeon: [name], MD;  Location: [place];  Service: Orthopedics;  Laterality: Right;   INCISION AND DRAINAGE ABSCESS  2007; 2015   "back"   INCISION AND DRAINAGE OF WOUND Right 12/22/2013   Procedure: I&D RIGHT BUTTOCK;  Surgeon: [name], MD;  Location: [place];  Service: Orthopedics;  Laterality: Right;   LEFT HEART CATHETERIZATION WITH CORONARY ANGIOGRAM N/A 02/18/2013   Procedure: LEFT HEART CATHETERIZATION WITH CORONARY ANGIOGRAM;  Surgeon: [name], MD;  Location: [facility] CATH LAB;  Service: Cardiovascular;  Laterality: N/A;   Patient Active Problem List   Diagnosis Date Noted   Diabetic neuropathy ([place]) 01/24/2022   Trapezius strain, left, initial encounter 02/02/2021   Left shoulder pain 12/30/2020   Acute pulmonary edema ([facility])    Benign paroxysmal positional vertigo 09/14/2020   Anemia associated with chronic renal failure    Hemoglobinuria 12/12/2018   OSA (obstructive sleep apnea) 02/28/2018   Chronic diastolic heart failure ([place]) 02/02/2018   Preventative health care 08/15/2017   Erectile dysfunction 04/22/2017   History of  pulmonary embolus (PE) 06/09/2016   Depression 05/04/2016   Hypogonadism in male 05/04/2016   Urinary retention 05/04/2016   Dyslipidemia 08/12/2014   Unilateral AKA, right ([place]) 12/26/2013   Hyperlipidemia 02/19/2013   S/P cardiac cath, 02/18/13, normal coronaries 02/19/2013   Chronic renal insufficiency, stage 3 (moderate) ([place]) 02/19/2013   Hypertension 07/24/2011   Long term current use of anticoagulant therapy 07/19/2011   History of DVT (deep vein thrombosis) 07/28/2009   Morbid obesity ([place]) 10/26/2005   Insulin dependent type 2 diabetes mellitus, controlled ([place]) 01/09/2002    ONSET DATE: 09/15/2021 (referral date)  REFERRING DIAG: [id number] (ICD-10-CM) - Unilateral AKA, right ([place])  THERAPY DIAG:  Other abnormalities of gait and mobility  Muscle weakness (generalized)  Unsteadiness on feet  Rationale for Evaluation and Treatment: Rehabilitation  SUBJECTIVE:   SUBJECTIVE STATEMENT: Pt denies falls or acute changes since last visit.  Patient arrives to session following gym this morning, he worked on burpees, STS, inclined press, cardio, and ski machine.  Blood sugar is 132.  Pt accompanied by: self-came via transportation  PERTINENT HISTORY: Insulin dependent DM2, hyperlipidemia, right AKA, chronic diastolic heart failure, HTN, BPPV  PAIN:  Are you having pain? No  PRECAUTIONS: Fall  WEIGHT BEARING RESTRICTIONS: No  FALLS: Has patient fallen in last 6 months? No  LIVING ENVIRONMENT: Lives with: lives alone Lives in: House/apartment Home Access: Level entry Home layout: One level Stairs: No Has following equipment at home: Lobbyist, Environmental consultant - 2 wheeled, Wheelchair (manual), Electronics engineer, and Grab bars  PLOF: Independent with household mobility with device, Independent with community mobility with device, Independent with transfers, and Needs assistance with homemaking  PATIENT GOALS: "To learn how to walk with this (gestures to prosthetic)  effectively, build my endurance and get out of this chair!"  OBJECTIVE:  There were no vitals filed for this visit.   Resting blood glucose: 132  DIAGNOSTIC FINDINGS: No recent relevant imaging.1   CURRENT PROSTHETIC WEAR ASSESSMENT: Patient is independent with: skin check, residual limb care, care of non-amputated limb, prosthetic cleaning, ply sock cleaning, correct ply sock adjustment, proper wear schedule/adjustment, and proper weight-bearing schedule/adjustment Patient is dependent with:  N/A Donning prosthesis: Complete Independence Doffing prosthesis: Complete Independence Prosthetic wear tolerance: 4-8 hours/day, 7 days/week; He is limited in his wear schedule because prosthetic does not fit under desk in class and lab. Prosthetic weight bearing tolerance: roughly 5 minutes Edema: pt reports stable limb edema Residual limb condition: Pt reports no open wounds, prefers to not doff liner. Prosthetic description: TF lanyard w/ [place] HD 4-bar mechanical knee (w/ manual lock) - pt states knee is hydraulic K code/activity level with prosthetic use: Level 2 *Remains unchanged 02/02/2022  TODAY'S TREATMENT:  Blood Glucose: 132  -SciFit using BUE/BLE on level 4.0 x6 minutes for dynamic cardiovascular warmup w/ large amplitude movements, progressed to BLEs only at 2.5 minute mark w/ focus on right glute engagement pushing prosthetic through full available ROM  GAIT: Gait pattern: {gait characteristics:25376} Distance walked: 20' + 5' + 7' + 42' Assistive device utilized: Environmental consultant - 2 wheeled Level of assistance: SBA Comments: Pt dons 10 ply prior to ambulation (2 socks).  Upon sitting, pt adjust tops of socks as they are rolling down and causing pressure discomfort at top of socket.  Severe in-toeing of LLE remains during stance.  Pt fatigued following  last bout resulting in uncontrolled lower into wheelchair.  -Discussed adjusting prosthetic when toe out by taking off and adjusting liner and socks.  Talk to [employer] about trimming medial aspect of liner as it is bunching, possible smaller sock size and other ply as pt only has 5 ply.  He does not want to trim socks or have PT trim them for more tailored fit.  Discuss either wedging between silicone liner and socket or shrinking socket for better lateral fit due to excessive space causing rotation upon standing.  PATIENT EDUCATION: Education details: Reviewed HEP. Person educated: Patient Education method: Theatre stage manager Education comprehension: verbalized understanding  HOME EXERCISE PROGRAM:  - Dead bug with green theraband 2 sets x 15 reps bil  Use of FWW + prosthetic leg with sit to stands during gym sessions in mornings  Printed for patient: -STS (use counter support if needed vs RW) -Standing march (kicking RLE straight) -Standing wide stance w/ counter support progressing to no UE support (use chair behind) *PT forgot to save to patient instructions following printing.  URL: [email or web address] Date: 01/19/2022 Prepared by: Malachi Carl  Exercises - Sidelying Hip Abduction (AKA)  - 1 x daily - 7 x weekly - 3 sets - 10 reps - Prone Hip Extension with Residual Limb (AKA)  - 1 x daily - 7 x weekly - 3 sets - 10 reps - Supine Bridge with Bilateral LE and Bolster for RLE (BKA)  - 1 x daily - 7 x weekly - 3 sets - 10 reps  Access Code: [email or web address] URL: [email or web address] Date: 01/26/2022 Prepared by: Malachi Carl  Exercises - Seated Calf Stretch with Strap  - 1 x daily - 7 x weekly - 3 sets - 30" hold - Prone Lying with Towel Roll (Hip Flexor Stretch) (BKA)  - 1 x daily - 7 x weekly - 1 sets - 5' hold - Prone Quadriceps Stretch with Strap  - 1 x daily - 7 x weekly - 3 sets - 30" hold   ASSESSMENT:  CLINICAL IMPRESSION: Assessed  LTGs this visit w/ focus on re-certification for continued progress.  Pt progress to this point has been modest, but his endurance continues to improve and he remains highly motivated in and outside the clinic.  His 2MWT tolerance remains 115' or 35 meters total.  He can stand unsupported for roughly 26 seconds and he remains highly compliant to his established HEP and would benefit from advancement in future sessions.  His gait speed is 0.37 m/sec using a front-wheeled walker.  He continues to benefit from skilled PT to progress overall upright tolerance to promote further prosthetic management and independence.  OBJECTIVE IMPAIRMENTS: Abnormal gait, decreased activity tolerance, decreased balance, decreased endurance, decreased mobility, difficulty walking, decreased ROM, decreased strength, improper body mechanics, prosthetic dependency , and obesity.   ACTIVITY LIMITATIONS: carrying, lifting, bending, standing,  squatting, stairs, transfers, and locomotion level  PARTICIPATION LIMITATIONS: meal prep, cleaning, laundry, and driving  PERSONAL FACTORS: Fitness, Time since onset of injury/illness/exacerbation, Transportation, and 1-2 comorbidities: insulin dependent DM2 and HTN  are also affecting patient's functional outcome.   REHAB POTENTIAL: Good  CLINICAL DECISION MAKING: Evolving/moderate complexity  EVALUATION COMPLEXITY: Moderate   OLD GOALS: Goals reviewed with patient? Yes  SHORT TERM GOALS: Target date: 01/27/2022  Pt will be independent with strength and balance HEP to maintain progress. Baseline: Provided 01/19/2022; Met on 01/24/2022 Goal status: MET  2.  Pt will decrease 5xSTS to </=13 seconds w/ use of only one hand on walker for support in order to demonstrate decreased risk for falls and improved functional bilateral LE strength and power. Baseline: 17.31 sec w/ BUE on RW; 01/24/2022 16.12 sec with BU in //; ' 32" with RUE support in // bars; 11.78" sec w/ BUE support in //  bars; 13.98" with RUE support in // bars on 02/10/2022 Goal status: IN PROGRESS  3. Patient will improve 2MWT score to 35 meters to demonstrate improvement in aerobic capacity and endurance needed for ambulating in the community.   Baseline: 28.8 m with SBA with RW and wheel chair follow; 18 meters with SBA w/ RW but increased soreness impacting patient this session 01/24/2022;  115 feet or 45mat 4/10 RPE at end with use of FWW + SBA and wheel chair follow on 02/10/2022 Goal status: MET  4.  Patient will improve gait speed to 0.35 m/s or greater with LRAD to indicate an improvement from being a household ambulator to limited community ambulator.   Baseline: 0.26 m/s with RW + SBA; 0.17 m/s with RW + SBA; 42" or 0.24 m/s with FWW + SBA on 02/10/2022 Goal status: IN PROGRESS  5.  Patient will demonstrate ability to stand unsupported with wide base gait for 2 minutes without LOB to improve static stability needed for safety within home setting. Baseline: Discontinued Berg as not appropriate for patient at this time. Revised goal to standing balance/component Berg goal; initial on 01/05/2022 5-52" trials with feet approximately 1.5 feet apart; improved to 62" on 01/24/2022; Static standing balance without UE support with WBOS x 15" (SBA) on 02/10/2022 Goal status: NOT MET  LONG TERM GOALS: Target date: 02/24/2022  Pt will be independent with finalized strengthening HEP for use in the gym at to maintain progress at home. Baseline: Reviewed and pt compliant (2/15) Goal status: MET  2.   Patient will improve 2MWT score to 80 meters to demonstrate improvement in aerobic capacity and endurance needed for ambulating in the community.   Baseline: 28.8 m with SBA with RW and wheel chair follow; 18 meters with SBA w/ RW but increased soreness impacting patient this session 01/24/2022; 115 feet or 340mt 4/10 RPE at end with use of FWW + SBA and wheel chair follow on 02/10/2022; 115' OR 3577m/15) Goal status: NOT  MET  3. Patient will improve gait speed to 0.6 m/s to indicate improvement to the level of limited community ambulator in order to participate more easily in activities outside of the home.    Baseline:  0.26 m/s with RW + SBA;  0.17 m/s with RW + SBA; 42" or 0.24 m/s with FWW + SBA; 0.37 m/sec w/ RW (2/15) Goal status: NOT MET  4.   Patient will demonstrate ability to stand unsupported with wide base gait for 2 minutes without LOB to improve static stability needed for safety within  home setting. Baseline: Discontinued Berg as not appropriate for patient at this time. Revised goal to standing balance/component Berg goal; initial on 01/05/2022 5-52" trials with feet approximately 1.5 feet apart; improved to 62" on 01/24/2022; Static standing balance without UE support with WBOS x 15" (SBA) on 02/10/2022; 25.75 seconds (2/15) Goal status: NOT MET  UPDATED GOALS: Goals reviewed with patient? Yes  SHORT TERM GOALS: Target date: 03/24/2022  1.  Pt will decrease 5xSTS to </=13 seconds w/ use of only one hand on walker for support in order to demonstrate decreased risk for falls and improved functional bilateral LE strength and power. Baseline:  13.98" with RUE support in // bars on 02/10/2022 Goal status: INITIAL  2. Patient will improve 2MWT score to 45 meters to demonstrate improvement in aerobic capacity and endurance needed for ambulating in the community.   Baseline: 115' OR 83m(2/15) Goal status: INITIAL  3.  Patient will improve gait speed to 0.45 m/s or greater with LRAD to indicate an improvement from being a household ambulator to limited community ambulator.   Baseline: 0.37 m/sec w/ RW (2/15) Goal status: INITIAL  4.  Patient will demonstrate ability to stand unsupported with wide base gait for at least 1 minute without LOB to improve static stability needed for safety within home setting. Baseline: 25.75 seconds (2/15) Goal status: INITIAL  LONG TERM GOALS: Target date:  04/21/2022  Pt will be independent with finalized and advanced strengthening HEP for use in the gym at to maintain progress at home. Baseline: Reviewed and pt compliant, needs advancement to standing endurance/balance (2/15) Goal status: INITIAL  2.   Patient will improve 2MWT score to 55 meters to demonstrate improvement in aerobic capacity and endurance needed for ambulating in the community.   Baseline: 115' OR 386m2/15) Goal status:  INITIAL  3. Patient will improve gait speed to 0.6 m/s to indicate improvement to the level of limited community ambulator in order to participate more easily in activities outside of the home.    Baseline:  0.37 m/sec w/ RW (2/15) Goal status: INITIAL  4.   Patient will demonstrate ability to stand unsupported with wide base gait for at least 2 minutes without LOB to improve static stability needed for safety within home setting. Baseline:  25.75 seconds (2/15) Goal status: INITIAL  PLAN:  PT FREQUENCY: 1x/week  PT DURATION: 8 weeks  PLANNED INTERVENTIONS: Therapeutic exercises, Therapeutic activity, Neuromuscular re-education, Balance training, Gait training, Patient/Family education, Self Care, Joint mobilization, Stair training, Vestibular training, Prosthetic training, DME instructions, Manual therapy, and Re-evaluation  PLAN FOR NEXT SESSION recumbent bike, pt wants to work towards elliptical-edu on safe and realistic goals as pt progresses, pt would like to work towards treadmill-overground tolerance and working on step up efficiency to access the treadmill would likely be best initially?, upright posture, standing balance, endurance, core strength, med ball slams-pt is resistant to using 10 lb med ball as he typically uses 50 lb at gym, modified SLS tasks; trial treadmill with assist x 2, unilateral support reaching all directions (forward/laterally/to floor/crossbody), forward T w/ unilateral support (kick prosthetic back), seated boxing, leg  press  MaElease EtiennePT, DPT 02/28/2022, 10:17 AM

## 2022-03-02 ENCOUNTER — Ambulatory Visit: Payer: Medicare Other | Admitting: Physical Therapy

## 2022-03-02 DIAGNOSIS — I129 Hypertensive chronic kidney disease with stage 1 through stage 4 chronic kidney disease, or unspecified chronic kidney disease: Secondary | ICD-10-CM | POA: Diagnosis not present

## 2022-03-02 DIAGNOSIS — E1122 Type 2 diabetes mellitus with diabetic chronic kidney disease: Secondary | ICD-10-CM | POA: Diagnosis not present

## 2022-03-02 DIAGNOSIS — N2581 Secondary hyperparathyroidism of renal origin: Secondary | ICD-10-CM | POA: Diagnosis not present

## 2022-03-02 DIAGNOSIS — N184 Chronic kidney disease, stage 4 (severe): Secondary | ICD-10-CM | POA: Diagnosis not present

## 2022-03-02 DIAGNOSIS — Z794 Long term (current) use of insulin: Secondary | ICD-10-CM | POA: Diagnosis not present

## 2022-03-02 DIAGNOSIS — D631 Anemia in chronic kidney disease: Secondary | ICD-10-CM | POA: Diagnosis not present

## 2022-03-02 DIAGNOSIS — E669 Obesity, unspecified: Secondary | ICD-10-CM | POA: Diagnosis not present

## 2022-03-02 DIAGNOSIS — E876 Hypokalemia: Secondary | ICD-10-CM | POA: Diagnosis not present

## 2022-03-02 DIAGNOSIS — Z86718 Personal history of other venous thrombosis and embolism: Secondary | ICD-10-CM | POA: Diagnosis not present

## 2022-03-02 DIAGNOSIS — I5032 Chronic diastolic (congestive) heart failure: Secondary | ICD-10-CM | POA: Diagnosis not present

## 2022-03-04 ENCOUNTER — Other Ambulatory Visit: Payer: Self-pay

## 2022-03-07 ENCOUNTER — Encounter (HOSPITAL_COMMUNITY)
Admission: RE | Admit: 2022-03-07 | Discharge: 2022-03-07 | Disposition: A | Discharge status: Home / Self Care | Payer: Medicare Other | Source: Ambulatory Visit | Attending: Nephrology | Admitting: Nephrology

## 2022-03-07 VITALS — BP 154/76 | HR 99 | Temp 98.0°F | Resp 18

## 2022-03-07 DIAGNOSIS — N183 Chronic kidney disease, stage 3 unspecified: Secondary | ICD-10-CM | POA: Insufficient documentation

## 2022-03-07 LAB — POCT HEMOGLOBIN-HEMACUE: Hemoglobin: 10.7 g/dL — ABNORMAL LOW (ref 13.0–17.0)

## 2022-03-07 MED ORDER — DARBEPOETIN ALFA 200 MCG/0.4ML IJ SOSY
PREFILLED_SYRINGE | INTRAMUSCULAR | Status: AC
  Administered 2022-03-07: 200 ug via SUBCUTANEOUS
  Filled 2022-03-07: qty 0.4

## 2022-03-07 MED ORDER — DARBEPOETIN ALFA 200 MCG/0.4ML IJ SOSY: 200.0000 ug | PREFILLED_SYRINGE | INTRAMUSCULAR | Status: DC

## 2022-03-08 ENCOUNTER — Other Ambulatory Visit: Payer: Self-pay | Admitting: Student in an Organized Health Care Education/Training Program

## 2022-03-09 ENCOUNTER — Encounter: Payer: Self-pay | Admitting: Physical Therapy

## 2022-03-09 ENCOUNTER — Ambulatory Visit: Payer: Medicare Other | Admitting: Physical Therapy

## 2022-03-09 DIAGNOSIS — R2689 Other abnormalities of gait and mobility: Secondary | ICD-10-CM

## 2022-03-09 DIAGNOSIS — M6281 Muscle weakness (generalized): Secondary | ICD-10-CM

## 2022-03-09 DIAGNOSIS — R2681 Unsteadiness on feet: Secondary | ICD-10-CM

## 2022-03-09 NOTE — Therapy (Signed)
OUTPATIENT PHYSICAL THERAPY PROSTHETICS TREATMENT-ARRIVED NO CHARGE   Patient Name: [name] MRN: [id number] DOB:03/09/1975, 47 y.o., male Today's Date: 03/09/2022  PCP: Starlyn Skeans, MD REFERRING PROVIDER: Charise Killian, MD  END OF SESSION:  PT End of Session - 03/09/22 0935     Visit Number 14    Number of Visits 25   17 + 8   Date for PT Re-Evaluation 0000000   re-cert on [id number]   Authorization Type MEDICARE PART A AND B    PT Start Time 0931    PT Stop Time 1000    PT Time Calculation (min) 29 min    Equipment Utilized During Treatment Gait belt    Activity Tolerance Other (comment)   low blood sugar   Behavior During Therapy WFL for tasks assessed/performed             Past Medical History:  Diagnosis Date   Acute venous embolism and thrombosis of deep vessels of proximal lower extremity ([place]) 07/19/2011   Anemia    Anginal pain ([place])    pt denies   Anxiety    Chest pain, neg MI, normal coronaries by cath 02/18/2013   pt denies   CHF (congestive heart failure) ([place])    Chronic renal disease, stage IV ([place]) 02/19/2013   Colon polyps    Depression    Diabetic ulcer of right foot ([place])    DVT (deep venous thrombosis) ([place]) 09/2002   patient reports additional DVTs in '06 & '11 (unconfirmed)   ED (erectile dysfunction)    GERD (gastroesophageal reflux disease)    History of blood transfusion    "related to OR" (10/31/2016)   Hyperlipidemia 02/19/2013   Hypertension    Nausea & vomiting    "constant for the last couple weeks" (10/31/2016)   Nephrotic syndrome    Obesity    BMI 44, weight 346 pounds 01/30/14   OSA (obstructive sleep apnea) 02/28/2018   Mild obstructive sleep apnea with an AHI of 9.8/h but severe during REM sleep with an AHI of 33.8/h.  Oxygen saturations dropped to 86% and there was moderate snoring   Peripheral edema    Pneumonia    Prosthesis adjustment 08/17/2016   Pulmonary embolism ([place]) 09/2002   treated with 6 months of  warfarin   Pyelonephritis 02/02/2018   Type I diabetes mellitus ([place]) dx'd 2001   Urinary retention    Past Surgical History:  Procedure Laterality Date   AMPUTATION Right 12/22/2013   Procedure: AMPUTATION BELOW KNEE;  Surgeon: [name], MD;  Location: [place];  Service: Orthopedics;  Laterality: Right;   AMPUTATION Right 02/19/2014   Procedure: RIGHT ABOVE KNEE AMPUTATION ;  Surgeon: [name], MD;  Location: [place];  Service: Orthopedics;  Laterality: Right;   blood clot removal  2016   CARDIAC CATHETERIZATION  02/18/2013   normal coronaries   ESOPHAGOGASTRODUODENOSCOPY N/A 11/02/2016   Procedure: ESOPHAGOGASTRODUODENOSCOPY (EGD);  Surgeon: [name], MD;  Location[facility] ENDOSCOPY;  Service: Endoscopy;  Laterality: N/A;   I & D EXTREMITY Right 12/14/2013   Procedure: IRRIGATION AND DEBRIDEMENT RIGHT FOOT;  Surgeon: [name], MD;  Location: [place];  Service: Orthopedics;  Laterality: Right;   INCISION AND DRAINAGE ABSCESS  2007; 2015   "back"   INCISION AND DRAINAGE OF WOUND Right 12/22/2013   Procedure: I&D RIGHT BUTTOCK;  Surgeon: [name], MD;  Location: [place];  Service: Orthopedics;  Laterality: Right;   LEFT HEART CATHETERIZATION WITH CORONARY ANGIOGRAM N/A  02/18/2013   Procedure: LEFT HEART CATHETERIZATION WITH CORONARY ANGIOGRAM;  Surgeon: [name], MD;  Location: [facility] CATH LAB;  Service: Cardiovascular;  Laterality: N/A;   Patient Active Problem List   Diagnosis Date Noted   Diabetic neuropathy ([place]) 01/24/2022   Trapezius strain, left, initial encounter 02/02/2021   Left shoulder pain 12/30/2020   Acute pulmonary edema ([facility])    Benign paroxysmal positional vertigo 09/14/2020   Anemia associated with chronic renal failure    Hemoglobinuria 12/12/2018   OSA (obstructive sleep apnea) 02/28/2018   Chronic diastolic heart failure ([place]) 02/02/2018   Preventative health care 08/15/2017   Erectile dysfunction 04/22/2017   History of pulmonary embolus (PE)  06/09/2016   Depression 05/04/2016   Hypogonadism in male 05/04/2016   Urinary retention 05/04/2016   Dyslipidemia 08/12/2014   Unilateral AKA, right ([place]) 12/26/2013   Hyperlipidemia 02/19/2013   S/P cardiac cath, 02/18/13, normal coronaries 02/19/2013   Chronic renal insufficiency, stage 3 (moderate) ([place]) 02/19/2013   Hypertension 07/24/2011   Long term current use of anticoagulant therapy 07/19/2011   History of DVT (deep vein thrombosis) 07/28/2009   Morbid obesity ([place]) 10/26/2005   Insulin dependent type 2 diabetes mellitus, controlled ([place]) 01/09/2002    ONSET DATE: 09/15/2021 (referral date)  REFERRING DIAG: [id number] (ICD-10-CM) - Unilateral AKA, right ([place])  THERAPY DIAG:  Other abnormalities of gait and mobility  Muscle weakness (generalized)  Unsteadiness on feet  Rationale for Evaluation and Treatment: Rehabilitation  SUBJECTIVE:   SUBJECTIVE STATEMENT: Pt states he is tired and cold (visibly shivering) this morning, denies feeling like he might be getting sick.  He went to the gym after waking up at 4am and did eat, but worked really hard in the gym doing things like burpees using the dip bar and lunges, etc.  Blood sugar is 65 prior to session-provided Coke to drink per pt request w/ PT monitoring pt blood sugar response over time.  He denies falls or other acute issues.    Pt accompanied by: self-came via transportation  PERTINENT HISTORY: Insulin dependent DM2, hyperlipidemia, right AKA, chronic diastolic heart failure, HTN, BPPV  PAIN:  Are you having pain? No  PRECAUTIONS: Fall  WEIGHT BEARING RESTRICTIONS: No  FALLS: Has patient fallen in last 6 months? No  LIVING ENVIRONMENT: Lives with: lives alone Lives in: House/apartment Home Access: Level entry Home layout: One level Stairs: No Has following equipment at home: Lobbyist, Environmental consultant - 2 wheeled, Wheelchair (manual), Electronics engineer, and Grab bars  PLOF: Independent with household  mobility with device, Independent with community mobility with device, Independent with transfers, and Needs assistance with homemaking  PATIENT GOALS: "To learn how to walk with this (gestures to prosthetic) effectively, build my endurance and get out of this chair!"  OBJECTIVE:  DIAGNOSTIC FINDINGS: No recent relevant imaging.1   CURRENT PROSTHETIC WEAR ASSESSMENT: Patient is independent with: skin check, residual limb care, care of non-amputated limb, prosthetic cleaning, ply sock cleaning, correct ply sock adjustment, proper wear schedule/adjustment, and proper weight-bearing schedule/adjustment Patient is dependent with:  N/A Donning prosthesis: Complete Independence Doffing prosthesis: Complete Independence Prosthetic wear tolerance: 4-8 hours/day, 7 days/week; He is limited in his wear schedule because prosthetic does not fit under desk in class and lab. Prosthetic weight bearing tolerance: roughly 5 minutes Edema: pt reports stable limb edema Residual limb condition: Pt reports no open wounds, prefers to not doff liner. Prosthetic description: TF lanyard w/ [place] HD 4-bar mechanical knee (w/ manual lock) - pt  states knee is hydraulic K code/activity level with prosthetic use: Level 2 *Remains unchanged 03/09/2022  TODAY'S TREATMENT:                                                                                                                                          Blood Glucose Onset of session: 65 Provided quick glucose supplement (Coke drink) w/ blood sugar 63 after approximately 8 minutes (checked again after weighing) Pt requesting to be weighed; weight of RW, pt and prosthetic 360.8 lbs; weight of prosthetic and RW 23.4 lbs; pt's weight 337.4 lbs Provided secondary glucose supplement following second check (animal crackers), blood glucose 65 approximately 10 minutes after  PATIENT EDUCATION: Education details: Discussed patient alternating gym days with therapy days,  impact of sleep schedule on blood sugar, eating appropriate ratio of carbs to other nutrients w/ rigorous gym schedule as pt is currently prioritizing protein only.  Discussed appt with [facility] tomorrow and talking about weight loss impact of socket fit, liner fit, and additional socks to adjust socket fit.  Discussed weight loss impact on current prosthetic socket and maximal function with needed adjustments as he continues actively losing weight. Person educated: Patient Education method: Theatre stage manager Education comprehension: verbalized understanding  HOME EXERCISE PROGRAM:  - Dead bug with green theraband 2 sets x 15 reps bil  Use of FWW + prosthetic leg with sit to stands during gym sessions in mornings  Printed for patient: -STS (use counter support if needed vs RW) -Standing march (kicking RLE straight) -Standing wide stance w/ counter support progressing to no UE support (use chair behind) *PT forgot to save to patient instructions following printing.  URL: [email or web address] Date: 01/19/2022 Prepared by: Malachi Carl  Exercises - Sidelying Hip Abduction (AKA)  - 1 x daily - 7 x weekly - 3 sets - 10 reps - Prone Hip Extension with Residual Limb (AKA)  - 1 x daily - 7 x weekly - 3 sets - 10 reps - Supine Bridge with Bilateral LE and Bolster for RLE (BKA)  - 1 x daily - 7 x weekly - 3 sets - 10 reps  Access Code: [email or web address] URL: [email or web address] Date: 01/26/2022 Prepared by: Malachi Carl  Exercises - Seated Calf Stretch with Strap  - 1 x daily - 7 x weekly - 3 sets - 30" hold - Prone Lying with Towel Roll (Hip Flexor Stretch) (BKA)  - 1 x daily - 7 x weekly - 1 sets - 5' hold - Prone Quadriceps Stretch with Strap  - 1 x daily - 7 x weekly - 3 sets - 30" hold   ASSESSMENT:  CLINICAL IMPRESSION: Pt arrives to clinic with blood sugar of 65 w/ difficulty getting it to rise within safe parameters for PT.  See above for further  details.  Patient was sent home with instructions to continue monitoring blood sugar and eat in order to  raise it within parameters as he has already been to the gym this morning without source of carbs which is likely why it has dropped so low.  OBJECTIVE IMPAIRMENTS: Abnormal gait, decreased activity tolerance, decreased balance, decreased endurance, decreased mobility, difficulty walking, decreased ROM, decreased strength, improper body mechanics, prosthetic dependency , and obesity.   ACTIVITY LIMITATIONS: carrying, lifting, bending, standing, squatting, stairs, transfers, and locomotion level  PARTICIPATION LIMITATIONS: meal prep, cleaning, laundry, and driving  PERSONAL FACTORS: Fitness, Time since onset of injury/illness/exacerbation, Transportation, and 1-2 comorbidities: insulin dependent DM2 and HTN  are also affecting patient's functional outcome.   REHAB POTENTIAL: Good  CLINICAL DECISION MAKING: Evolving/moderate complexity  EVALUATION COMPLEXITY: Moderate   UPDATED GOALS: Goals reviewed with patient? Yes  SHORT TERM GOALS: Target date: 03/24/2022  1.  Pt will decrease 5xSTS to </=13 seconds w/ use of only one hand on walker for support in order to demonstrate decreased risk for falls and improved functional bilateral LE strength and power. Baseline:  13.98" with RUE support in // bars on 02/10/2022 Goal status: INITIAL  2. Patient will improve 2MWT score to 45 meters to demonstrate improvement in aerobic capacity and endurance needed for ambulating in the community.   Baseline: 115' OR 73m(2/15) Goal status: INITIAL  3.  Patient will improve gait speed to 0.45 m/s or greater with LRAD to indicate an improvement from being a household ambulator to limited community ambulator.   Baseline: 0.37 m/sec w/ RW (2/15) Goal status: INITIAL  4.  Patient will demonstrate ability to stand unsupported with wide base gait for at least 1 minute without LOB to improve static stability  needed for safety within home setting. Baseline: 25.75 seconds (2/15) Goal status: INITIAL  LONG TERM GOALS: Target date: 04/21/2022  Pt will be independent with finalized and advanced strengthening HEP for use in the gym at to maintain progress at home. Baseline: Reviewed and pt compliant, needs advancement to standing endurance/balance (2/15) Goal status: INITIAL  2.   Patient will improve 2MWT score to 55 meters to demonstrate improvement in aerobic capacity and endurance needed for ambulating in the community.   Baseline: 115' OR 340m2/15) Goal status:  INITIAL  3. Patient will improve gait speed to 0.6 m/s to indicate improvement to the level of limited community ambulator in order to participate more easily in activities outside of the home.    Baseline:  0.37 m/sec w/ RW (2/15) Goal status: INITIAL  4.   Patient will demonstrate ability to stand unsupported with wide base gait for at least 2 minutes without LOB to improve static stability needed for safety within home setting. Baseline:  25.75 seconds (2/15) Goal status: INITIAL  PLAN:  PT FREQUENCY: 1x/week  PT DURATION: 8 weeks  PLANNED INTERVENTIONS: Therapeutic exercises, Therapeutic activity, Neuromuscular re-education, Balance training, Gait training, Patient/Family education, Self Care, Joint mobilization, Stair training, Vestibular training, Prosthetic training, DME instructions, Manual therapy, and Re-evaluation  PLAN FOR NEXT SESSION recumbent bike, overground tolerance and working on step up efficiency, upright posture, standing balance, endurance, core strength, med ball slams-pt is resistant to using 10 lb med ball as he typically uses 50 lb at gym, unilateral support reaching all directions (forward/laterally/to floor/crossbody), forward T w/ unilateral support (kick prosthetic back), seated boxing w/ bag, Pt would like to weigh at the start of each session.  MaElease EtiennePT, DPT 03/09/2022, 10:28 AM

## 2022-03-14 ENCOUNTER — Encounter (HOSPITAL_COMMUNITY): Payer: Medicare Other

## 2022-03-16 ENCOUNTER — Encounter: Payer: Self-pay | Admitting: Physical Therapy

## 2022-03-16 ENCOUNTER — Ambulatory Visit: Payer: Medicare Other | Attending: Internal Medicine | Admitting: Physical Therapy

## 2022-03-16 DIAGNOSIS — M6281 Muscle weakness (generalized): Secondary | ICD-10-CM

## 2022-03-16 DIAGNOSIS — R2681 Unsteadiness on feet: Secondary | ICD-10-CM | POA: Diagnosis not present

## 2022-03-16 DIAGNOSIS — R2689 Other abnormalities of gait and mobility: Secondary | ICD-10-CM | POA: Diagnosis not present

## 2022-03-16 NOTE — Therapy (Addendum)
OUTPATIENT PHYSICAL THERAPY PROSTHETICS TREATMENT   Patient Name: [name] MRN: [id number] DOB:06/28/1975, 47 y.o., male Today's Date: 03/16/2022  PCP: Starlyn Skeans, MD REFERRING PROVIDER: Charise Killian, MD  END OF SESSION:  PT End of Session - 03/16/22 0934     Visit Number 15    Number of Visits 25    Date for PT Re-Evaluation 04/21/22    Authorization Type MEDICARE PART A AND B    PT Start Time 0935    PT Stop Time 1017    PT Time Calculation (min) 42 min    Equipment Utilized During Treatment Gait belt    Activity Tolerance Other (comment)    Behavior During Therapy WFL for tasks assessed/performed             Past Medical History:  Diagnosis Date   Acute venous embolism and thrombosis of deep vessels of proximal lower extremity ([place]) 07/19/2011   Anemia    Anginal pain ([place])    pt denies   Anxiety    Chest pain, neg MI, normal coronaries by cath 02/18/2013   pt denies   CHF (congestive heart failure) (HCC)    Chronic renal disease, stage IV ([place]) 02/19/2013   Colon polyps    Depression    Diabetic ulcer of right foot ([place])    DVT (deep venous thrombosis) ([place]) 09/2002   patient reports additional DVTs in '06 & '11 (unconfirmed)   ED (erectile dysfunction)    GERD (gastroesophageal reflux disease)    History of blood transfusion    "related to OR" (10/31/2016)   Hyperlipidemia 02/19/2013   Hypertension    Nausea & vomiting    "constant for the last couple weeks" (10/31/2016)   Nephrotic syndrome    Obesity    BMI 44, weight 346 pounds 01/30/14   OSA (obstructive sleep apnea) 02/28/2018   Mild obstructive sleep apnea with an AHI of 9.8/h but severe during REM sleep with an AHI of 33.8/h.  Oxygen saturations dropped to 86% and there was moderate snoring   Peripheral edema    Pneumonia    Prosthesis adjustment 08/17/2016   Pulmonary embolism ([place]) 09/2002   treated with 6 months of warfarin   Pyelonephritis 02/02/2018   Type I diabetes mellitus  ([place]) dx'd 2001   Urinary retention    Past Surgical History:  Procedure Laterality Date   AMPUTATION Right 12/22/2013   Procedure: AMPUTATION BELOW KNEE;  Surgeon: [name], MD;  Location: [facility];  Service: Orthopedics;  Laterality: Right;   AMPUTATION Right 02/19/2014   Procedure: RIGHT ABOVE KNEE AMPUTATION ;  Surgeon: [name], MD;  Location: [facility];  Service: Orthopedics;  Laterality: Right;   blood clot removal  2016   CARDIAC CATHETERIZATION  02/18/2013   normal coronaries   ESOPHAGOGASTRODUODENOSCOPY N/A 11/02/2016   Procedure: ESOPHAGOGASTRODUODENOSCOPY (EGD);  Surgeon: [name], MD;  Location[facility] ENDOSCOPY;  Service: Endoscopy;  Laterality: N/A;   I & D EXTREMITY Right 12/14/2013   Procedure: IRRIGATION AND DEBRIDEMENT RIGHT FOOT;  Surgeon: [name], MD;  Location: [facility];  Service: Orthopedics;  Laterality: Right;   INCISION AND DRAINAGE ABSCESS  2007; 2015   "back"   INCISION AND DRAINAGE OF WOUND Right 12/22/2013   Procedure: I&D RIGHT BUTTOCK;  Surgeon: [name], MD;  Location: [facility];  Service: Orthopedics;  Laterality: Right;   LEFT HEART CATHETERIZATION WITH CORONARY ANGIOGRAM N/A 02/18/2013   Procedure: LEFT HEART CATHETERIZATION WITH CORONARY ANGIOGRAM;  Surgeon: Ander Slade  [place], MD;  Location[facility] LAB;  Service: Cardiovascular;  Laterality: N/A;   Patient Active Problem List   Diagnosis Date Noted   Diabetic neuropathy ([place]) 01/24/2022   Trapezius strain, left, initial encounter 02/02/2021   Left shoulder pain 12/30/2020   Acute pulmonary edema (HCC)    Benign paroxysmal positional vertigo 09/14/2020   Anemia associated with chronic renal failure    Hemoglobinuria 12/12/2018   OSA (obstructive sleep apnea) 02/28/2018   Chronic diastolic heart failure ([place]) 02/02/2018   Preventative health care 08/15/2017   Erectile dysfunction 04/22/2017   History of pulmonary embolus (PE) 06/09/2016   Depression 05/04/2016   Hypogonadism in male 05/04/2016    Urinary retention 05/04/2016   Dyslipidemia 08/12/2014   Unilateral AKA, right ([place]) 12/26/2013   Hyperlipidemia 02/19/2013   S/P cardiac cath, 02/18/13, normal coronaries 02/19/2013   Chronic renal insufficiency, stage 3 (moderate) ([place]) 02/19/2013   Hypertension 07/24/2011   Long term current use of anticoagulant therapy 07/19/2011   History of DVT (deep vein thrombosis) 07/28/2009   Morbid obesity ([place]) 10/26/2005   Insulin dependent type 2 diabetes mellitus, controlled ([place]) 01/09/2002    ONSET DATE: 09/15/2021 (referral date)  REFERRING DIAG: [id number] (ICD-10-CM) - Unilateral AKA, right ([place])  THERAPY DIAG:  Other abnormalities of gait and mobility  Muscle weakness (generalized)  Unsteadiness on feet  Rationale for Evaluation and Treatment: Rehabilitation  SUBJECTIVE:   SUBJECTIVE STATEMENT: Pt arrives to session and reports doing alright. Denies falls/near falls. Patient goes to [facility] today for adjustments for socket/sock. Patient reports that when he was at the gym this morning, his liner fell off. He states he was super sweaty but thinks he may need a new foam. Wants to wait till later to weigh self.   Pt accompanied by: self-came via transportation  PERTINENT HISTORY: Insulin dependent DM2, hyperlipidemia, right AKA, chronic diastolic heart failure, HTN, BPPV  PAIN:  Are you having pain? No  PRECAUTIONS: Fall  WEIGHT BEARING RESTRICTIONS: No  FALLS: Has patient fallen in last 6 months? No  LIVING ENVIRONMENT: Lives with: lives alone Lives in: House/apartment Home Access: Level entry Home layout: One level Stairs: No Has following equipment at home: Lobbyist, Environmental consultant - 2 wheeled, Wheelchair (manual), Electronics engineer, and Grab bars  PLOF: Independent with household mobility with device, Independent with community mobility with device, Independent with transfers, and Needs assistance with homemaking  PATIENT GOALS: "To learn how to walk with this  (gestures to prosthetic) effectively, build my endurance and get out of this chair!"  OBJECTIVE:  DIAGNOSTIC FINDINGS: No recent relevant imaging.1   CURRENT PROSTHETIC WEAR ASSESSMENT: Patient is independent with: skin check, residual limb care, care of non-amputated limb, prosthetic cleaning, ply sock cleaning, correct ply sock adjustment, proper wear schedule/adjustment, and proper weight-bearing schedule/adjustment Patient is dependent with:  N/A Donning prosthesis: Complete Independence Doffing prosthesis: Complete Independence Prosthetic wear tolerance: 4-8 hours/day, 7 days/week; He is limited in his wear schedule because prosthetic does not fit under desk in class and lab. Prosthetic weight bearing tolerance: roughly 5 minutes Edema: pt reports stable limb edema Residual limb condition: Pt reports no open wounds, prefers to not doff liner. Prosthetic description: TF lanyard w/ [place] HD 4-bar mechanical knee (w/ manual lock) - pt states knee is hydraulic K code/activity level with prosthetic use: Level 2 *Remains unchanged 03/09/2022  TODAY'S TREATMENT:  Blood glucose at start of session: 156  GAIT: Ambulation 80 feet with 2WW (SBA) + WC follow  THERE ACT: Standing in // bars with lateral cross body reaching and placing then return with single UE support (SBA) - 6 cones (performed 1 x bil) Standing in // bars with lateral cross body reaching, placing, and return with no UE support (GBA) - 6 cones (performed 1 x bil) Standing in // bars with same side body reaching and placing then return cones from 18 inch box (pickups) with single UE support progressing to touch down UE as need (CGA) - 2 x 6 cones with LUE only due to fatigue Pt requesting to be weighed; weight of RW, pt and prosthetic 354 lbs; weight of prosthetic and RW 23.4 lbs; pt's weight  330.6 lbs  PATIENT EDUCATION: Education details: Continue HEP + need for appropriate fit socket to be able to tolerate longer distance ambulation Person educated: Patient Education method: Theatre stage manager Education comprehension: verbalized understanding  HOME EXERCISE PROGRAM:  - Dead bug with green theraband 2 sets x 15 reps bil  Use of FWW + prosthetic leg with sit to stands during gym sessions in mornings  Printed for patient: -STS (use counter support if needed vs RW) -Standing march (kicking RLE straight) -Standing wide stance w/ counter support progressing to no UE support (use chair behind) *PT forgot to save to patient instructions following printing.  URL: [email or web address] Date: 01/19/2022 Prepared by: Malachi Carl  Exercises - Sidelying Hip Abduction (AKA)  - 1 x daily - 7 x weekly - 3 sets - 10 reps - Prone Hip Extension with Residual Limb (AKA)  - 1 x daily - 7 x weekly - 3 sets - 10 reps - Supine Bridge with Bilateral LE and Bolster for RLE (BKA)  - 1 x daily - 7 x weekly - 3 sets - 10 reps  Access Code: [email or web address] URL: [email or web address] Date: 01/26/2022 Prepared by: Malachi Carl  Exercises - Seated Calf Stretch with Strap  - 1 x daily - 7 x weekly - 3 sets - 30" hold - Prone Lying with Towel Roll (Hip Flexor Stretch) (BKA)  - 1 x daily - 7 x weekly - 1 sets - 5' hold - Prone Quadriceps Stretch with Strap  - 1 x daily - 7 x weekly - 3 sets - 30" hold   ASSESSMENT:  CLINICAL IMPRESSION: Session emphasized continued progression of tolerance for functional dynamic activities including cross body reaching and elevated item pickups. Initiated session with walking warmup; however, distance ambulated was limited due to poorly fitting socket/liner. Recommended follow up with [employer] to determine need for new socket entirely or additional padding. Patient continues to fatigue and requires seated rest break between activities  but demonstrated reduced need for UE support with cross body reaching compared to when last assessed. Continue POC.    OBJECTIVE IMPAIRMENTS: Abnormal gait, decreased activity tolerance, decreased balance, decreased endurance, decreased mobility, difficulty walking, decreased ROM, decreased strength, improper body mechanics, prosthetic dependency , and obesity.   ACTIVITY LIMITATIONS: carrying, lifting, bending, standing, squatting, stairs, transfers, and locomotion level  PARTICIPATION LIMITATIONS: meal prep, cleaning, laundry, and driving  PERSONAL FACTORS: Fitness, Time since onset of injury/illness/exacerbation, Transportation, and 1-2 comorbidities: insulin dependent DM2 and HTN  are also affecting patient's functional outcome.   REHAB POTENTIAL: Good  CLINICAL DECISION MAKING: Evolving/moderate complexity  EVALUATION COMPLEXITY: Moderate   UPDATED GOALS: Goals reviewed with patient? Yes  SHORT TERM GOALS: Target date: 03/24/2022  1.  Pt will decrease 5xSTS to </=13 seconds w/ use of only one hand on walker for support in order to demonstrate decreased risk for falls and improved functional bilateral LE strength and power. Baseline:  13.98" with RUE support in // bars on 02/10/2022 Goal status: INITIAL  2. Patient will improve 2MWT score to 45 meters to demonstrate improvement in aerobic capacity and endurance needed for ambulating in the community.   Baseline: 115' OR 28m(2/15) Goal status: INITIAL  3.  Patient will improve gait speed to 0.45 m/s or greater with LRAD to indicate an improvement from being a household ambulator to limited community ambulator.   Baseline: 0.37 m/sec w/ RW (2/15) Goal status: INITIAL  4.  Patient will demonstrate ability to stand unsupported with wide base gait for at least 1 minute without LOB to improve static stability needed for safety within home setting. Baseline: 25.75 seconds (2/15) Goal status: INITIAL  LONG TERM GOALS: Target date:  04/21/2022  Pt will be independent with finalized and advanced strengthening HEP for use in the gym at to maintain progress at home. Baseline: Reviewed and pt compliant, needs advancement to standing endurance/balance (2/15) Goal status: INITIAL  2.   Patient will improve 2MWT score to 55 meters to demonstrate improvement in aerobic capacity and endurance needed for ambulating in the community.   Baseline: 115' OR 3101m2/15) Goal status:  INITIAL  3. Patient will improve gait speed to 0.6 m/s to indicate improvement to the level of limited community ambulator in order to participate more easily in activities outside of the home.    Baseline:  0.37 m/sec w/ RW (2/15) Goal status: INITIAL  4.   Patient will demonstrate ability to stand unsupported with wide base gait for at least 2 minutes without LOB to improve static stability needed for safety within home setting. Baseline:  25.75 seconds (2/15) Goal status: INITIAL  PLAN:  PT FREQUENCY: 1x/week  PT DURATION: 8 weeks  PLANNED INTERVENTIONS: Therapeutic exercises, Therapeutic activity, Neuromuscular re-education, Balance training, Gait training, Patient/Family education, Self Care, Joint mobilization, Stair training, Vestibular training, Prosthetic training, DME instructions, Manual therapy, and Re-evaluation  PLAN FOR NEXT SESSION recumbent bike, overground tolerance and working on step up efficiency, upright posture, standing balance, endurance, core strength, unilateral support reaching all directions (forward/laterally/to floor/crossbody), forward T w/ unilateral support (kick prosthetic back), seated boxing w/ bag, Pt would like to weigh at the start of each session, follow up about [employer] fitting, patient wants to be able to pick up objects from floor, standing bird dogs/ask about anti-perspirant   SaMalachi CarlPT, DPT 03/16/2022, 11:54 AM

## 2022-03-23 ENCOUNTER — Ambulatory Visit: Payer: Medicare Other | Admitting: Physical Therapy

## 2022-03-24 ENCOUNTER — Ambulatory Visit: Payer: Medicare Other | Admitting: Physical Therapy

## 2022-03-24 ENCOUNTER — Encounter: Payer: Self-pay | Admitting: Physical Therapy

## 2022-03-24 DIAGNOSIS — R2689 Other abnormalities of gait and mobility: Secondary | ICD-10-CM | POA: Diagnosis not present

## 2022-03-24 DIAGNOSIS — R2681 Unsteadiness on feet: Secondary | ICD-10-CM | POA: Diagnosis not present

## 2022-03-24 DIAGNOSIS — M6281 Muscle weakness (generalized): Secondary | ICD-10-CM | POA: Diagnosis not present

## 2022-03-24 NOTE — Therapy (Unsigned)
OUTPATIENT PHYSICAL THERAPY PROSTHETICS TREATMENT   Patient Name: [name] MRN: [id number] DOB:02-10-1975, 47 y.o., male Today's Date: 03/24/2022  PCP: Starlyn Skeans, MD REFERRING PROVIDER: Charise Killian, MD  END OF SESSION:  PT End of Session - 03/24/22 0806     Visit Number 16    Number of Visits 25    Date for PT Re-Evaluation 04/21/22    Authorization Type MEDICARE PART A AND B    PT Start Time 0803    PT Stop Time 0847    PT Time Calculation (min) 44 min    Equipment Utilized During Treatment Gait belt    Activity Tolerance Patient tolerated treatment well    Behavior During Therapy WFL for tasks assessed/performed             Past Medical History:  Diagnosis Date   Acute venous embolism and thrombosis of deep vessels of proximal lower extremity ([place]) 07/19/2011   Anemia    Anginal pain ([place])    pt denies   Anxiety    Chest pain, neg MI, normal coronaries by cath 02/18/2013   pt denies   CHF (congestive heart failure) ([place])    Chronic renal disease, stage IV ([place]) 02/19/2013   Colon polyps    Depression    Diabetic ulcer of right foot ([place])    DVT (deep venous thrombosis) ([place]) 09/2002   patient reports additional DVTs in '06 & '11 (unconfirmed)   ED (erectile dysfunction)    GERD (gastroesophageal reflux disease)    History of blood transfusion    "related to OR" (10/31/2016)   Hyperlipidemia 02/19/2013   Hypertension    Nausea & vomiting    "constant for the last couple weeks" (10/31/2016)   Nephrotic syndrome    Obesity    BMI 44, weight 346 pounds 01/30/14   OSA (obstructive sleep apnea) 02/28/2018   Mild obstructive sleep apnea with an AHI of 9.8/h but severe during REM sleep with an AHI of 33.8/h.  Oxygen saturations dropped to 86% and there was moderate snoring   Peripheral edema    Pneumonia    Prosthesis adjustment 08/17/2016   Pulmonary embolism ([place]) 09/2002   treated with 6 months of warfarin   Pyelonephritis 02/02/2018   Type I  diabetes mellitus ([place]) dx'd 2001   Urinary retention    Past Surgical History:  Procedure Laterality Date   AMPUTATION Right 12/22/2013   Procedure: AMPUTATION BELOW KNEE;  Surgeon: [name], MD;  Location: [facility];  Service: Orthopedics;  Laterality: Right;   AMPUTATION Right 02/19/2014   Procedure: RIGHT ABOVE KNEE AMPUTATION ;  Surgeon: [name], MD;  Location: [facility];  Service: Orthopedics;  Laterality: Right;   blood clot removal  2016   CARDIAC CATHETERIZATION  02/18/2013   normal coronaries   ESOPHAGOGASTRODUODENOSCOPY N/A 11/02/2016   Procedure: ESOPHAGOGASTRODUODENOSCOPY (EGD);  Surgeon: [name], MD;  Location[facility] ENDOSCOPY;  Service: Endoscopy;  Laterality: N/A;   I & D EXTREMITY Right 12/14/2013   Procedure: IRRIGATION AND DEBRIDEMENT RIGHT FOOT;  Surgeon: [name], MD;  Location: [facility];  Service: Orthopedics;  Laterality: Right;   INCISION AND DRAINAGE ABSCESS  2007; 2015   "back"   INCISION AND DRAINAGE OF WOUND Right 12/22/2013   Procedure: I&D RIGHT BUTTOCK;  Surgeon: [name], MD;  Location: [facility];  Service: Orthopedics;  Laterality: Right;   LEFT HEART CATHETERIZATION WITH CORONARY ANGIOGRAM N/A 02/18/2013   Procedure: LEFT HEART CATHETERIZATION WITH CORONARY ANGIOGRAM;  Surgeon:  [name], MD;  Location: [facility] CATH LAB;  Service: Cardiovascular;  Laterality: N/A;   Patient Active Problem List   Diagnosis Date Noted   Diabetic neuropathy ([place]) 01/24/2022   Trapezius strain, left, initial encounter 02/02/2021   Left shoulder pain 12/30/2020   Acute pulmonary edema ([facility])    Benign paroxysmal positional vertigo 09/14/2020   Anemia associated with chronic renal failure    Hemoglobinuria 12/12/2018   OSA (obstructive sleep apnea) 02/28/2018   Chronic diastolic heart failure ([place]) 02/02/2018   Preventative health care 08/15/2017   Erectile dysfunction 04/22/2017   History of pulmonary embolus (PE) 06/09/2016   Depression 05/04/2016   Hypogonadism  in male 05/04/2016   Urinary retention 05/04/2016   Dyslipidemia 08/12/2014   Unilateral AKA, right ([place]) 12/26/2013   Hyperlipidemia 02/19/2013   S/P cardiac cath, 02/18/13, normal coronaries 02/19/2013   Chronic renal insufficiency, stage 3 (moderate) ([place]) 02/19/2013   Hypertension 07/24/2011   Long term current use of anticoagulant therapy 07/19/2011   History of DVT (deep vein thrombosis) 07/28/2009   Morbid obesity ([place]) 10/26/2005   Insulin dependent type 2 diabetes mellitus, controlled ([place]) 01/09/2002    ONSET DATE: 09/15/2021 (referral date)  REFERRING DIAG: [id number] (ICD-10-CM) - Unilateral AKA, right ([place])  THERAPY DIAG:  Other abnormalities of gait and mobility  Muscle weakness (generalized)  Unsteadiness on feet  Rationale for Evaluation and Treatment: Rehabilitation  SUBJECTIVE:   SUBJECTIVE STATEMENT: Pt comes in complaining of new onset right elbow pain since attending the gym last Wednesday.  It is responding to topicals and stretches.  He requests to not do standing activities today due to this.  He states he could do mat level activity.  He shows PT the foam wedge added to anterolateral portion of socket.  He denies falls since visit prior.  Pt accompanied by: self-came via transportation  PERTINENT HISTORY: Insulin dependent DM2, hyperlipidemia, right AKA, chronic diastolic heart failure, HTN, BPPV  PAIN:  Are you having pain? Yes: NPRS scale: 6/10 Pain location: right elbow Pain description: throbbing Aggravating factors: ER of the shoulder, weight-bearing during transfers Relieving factors: topical gels like icy hot, ice pack, heating pad  PRECAUTIONS: Fall  WEIGHT BEARING RESTRICTIONS: No  FALLS: Has patient fallen in last 6 months? No  LIVING ENVIRONMENT: Lives with: lives alone Lives in: House/apartment Home Access: Level entry Home layout: One level Stairs: No Has following equipment at home: Lobbyist, Environmental consultant - 2 wheeled,  Wheelchair (manual), Electronics engineer, and Grab bars  PLOF: Independent with household mobility with device, Independent with community mobility with device, Independent with transfers, and Needs assistance with homemaking  PATIENT GOALS: "To learn how to walk with this (gestures to prosthetic) effectively, build my endurance and get out of this chair!"  OBJECTIVE:  DIAGNOSTIC FINDINGS: No recent relevant imaging.1   CURRENT PROSTHETIC WEAR ASSESSMENT: Patient is independent with: skin check, residual limb care, care of non-amputated limb, prosthetic cleaning, ply sock cleaning, correct ply sock adjustment, proper wear schedule/adjustment, and proper weight-bearing schedule/adjustment Patient is dependent with:  N/A Donning prosthesis: Complete Independence Doffing prosthesis: Complete Independence Prosthetic wear tolerance: 4-8 hours/day, 7 days/week; He is limited in his wear schedule because prosthetic does not fit under desk in class and lab. Prosthetic weight bearing tolerance: roughly 5 minutes Edema: pt reports stable limb edema Residual limb condition: Pt reports no open wounds, prefers to not doff liner. Prosthetic description: TF lanyard w/ [place] HD 4-bar mechanical knee (w/ manual lock) - pt states  knee is hydraulic K code/activity level with prosthetic use: Level 2 *Remains unchanged 03/23/2022  TODAY'S TREATMENT:                                                                                                                                          Blood glucose at start of session: 144  THERE ACT: Attempted standing at counter w/ progression to birddog > in // bars, pt unable to tolerate due to weight-bearing through elbow so activity d/c'd Pt requesting to be weighed; weight of RW, pt and prosthetic 351.6 lbs; weight of prosthetic and RW 23.4 lbs; pt's weight 328.2 lbs  TherEx -Deadbugs w/ blue resistance band x8 each side > black resistance band x8 each side -PPT in supine  progressed to side-lying, pt self-progresses to prone, discussion of progression to quadruped w/o ability to tolerate position today due to elbow -Brief review of single leg bridge and goal of clearing bilateral hips -Supine crunch w/ arms straight at 45 degree angle to mat table x10 > crunch holds 10x2 seconds, palpation of upper abdominals w/ good activation noted, used clearance of shoulder blades to determine height of crunch w/ pt almost able to fully clear -Supine russian twist w/ 3.3lb ball 2x10  PATIENT EDUCATION: Education details: Continue HEP Person educated: Patient Education method: Theatre stage manager Education comprehension: verbalized understanding  HOME EXERCISE PROGRAM:  - Dead bug with green theraband 2 sets x 15 reps bil  Use of FWW + prosthetic leg with sit to stands during gym sessions in mornings  Printed for patient: -STS (use counter support if needed vs RW) -Standing march (kicking RLE straight) -Standing wide stance w/ counter support progressing to no UE support (use chair behind) *PT forgot to save to patient instructions following printing.  URL: [email or web address] Date: 01/19/2022 Prepared by: Malachi Carl  Exercises - Sidelying Hip Abduction (AKA)  - 1 x daily - 7 x weekly - 3 sets - 10 reps - Prone Hip Extension with Residual Limb (AKA)  - 1 x daily - 7 x weekly - 3 sets - 10 reps - Supine Bridge with Bilateral LE and Bolster for RLE (BKA)  - 1 x daily - 7 x weekly - 3 sets - 10 reps  Access Code: [email or web address] URL: [email or web address] Date: 01/26/2022 Prepared by: Malachi Carl  Exercises - Seated Calf Stretch with Strap  - 1 x daily - 7 x weekly - 3 sets - 30" hold - Prone Lying with Towel Roll (Hip Flexor Stretch) (BKA)  - 1 x daily - 7 x weekly - 1 sets - 5' hold - Prone Quadriceps Stretch with Strap  - 1 x daily - 7 x weekly - 3 sets - 30" hold   ASSESSMENT:  CLINICAL IMPRESSION: Session was limited by  elbow pain that patient developed from gym activity prior to clinic visit.  While this is getting better, he could  not tolerate standing mobility today as he continues to need heavy UE reliance during ambulation and standing balance tasks.  Focused time on core strength with review and light progression to harder mat level activities.  Will attempt to continue with upright activities to further address safety with all mobility in coming session.    OBJECTIVE IMPAIRMENTS: Abnormal gait, decreased activity tolerance, decreased balance, decreased endurance, decreased mobility, difficulty walking, decreased ROM, decreased strength, improper body mechanics, prosthetic dependency , and obesity.   ACTIVITY LIMITATIONS: carrying, lifting, bending, standing, squatting, stairs, transfers, and locomotion level  PARTICIPATION LIMITATIONS: meal prep, cleaning, laundry, and driving  PERSONAL FACTORS: Fitness, Time since onset of injury/illness/exacerbation, Transportation, and 1-2 comorbidities: insulin dependent DM2 and HTN  are also affecting patient's functional outcome.   REHAB POTENTIAL: Good  CLINICAL DECISION MAKING: Evolving/moderate complexity  EVALUATION COMPLEXITY: Moderate   UPDATED GOALS: Goals reviewed with patient? Yes  SHORT TERM GOALS: Target date: 03/24/2022  1.  Pt will decrease 5xSTS to </=13 seconds w/ use of only one hand on walker for support in order to demonstrate decreased risk for falls and improved functional bilateral LE strength and power. Baseline:  13.98" with RUE support in // bars on 02/10/2022 Goal status: INITIAL  2. Patient will improve 2MWT score to 45 meters to demonstrate improvement in aerobic capacity and endurance needed for ambulating in the community.   Baseline: 115' OR 38m (2/15) Goal status: INITIAL  3.  Patient will improve gait speed to 0.45 m/s or greater with LRAD to indicate an improvement from being a household ambulator to limited community  ambulator.   Baseline: 0.37 m/sec w/ RW (2/15) Goal status: INITIAL  4.  Patient will demonstrate ability to stand unsupported with wide base gait for at least 1 minute without LOB to improve static stability needed for safety within home setting. Baseline: 25.75 seconds (2/15) Goal status: INITIAL  LONG TERM GOALS: Target date: 04/21/2022  Pt will be independent with finalized and advanced strengthening HEP for use in the gym at to maintain progress at home. Baseline: Reviewed and pt compliant, needs advancement to standing endurance/balance (2/15) Goal status: INITIAL  2.   Patient will improve 2MWT score to 55 meters to demonstrate improvement in aerobic capacity and endurance needed for ambulating in the community.   Baseline: 115' OR 63m (2/15) Goal status:  INITIAL  3. Patient will improve gait speed to 0.6 m/s to indicate improvement to the level of limited community ambulator in order to participate more easily in activities outside of the home.    Baseline:  0.37 m/sec w/ RW (2/15) Goal status: INITIAL  4.   Patient will demonstrate ability to stand unsupported with wide base gait for at least 2 minutes without LOB to improve static stability needed for safety within home setting. Baseline:  25.75 seconds (2/15) Goal status: INITIAL  PLAN:  PT FREQUENCY: 1x/week  PT DURATION: 8 weeks  PLANNED INTERVENTIONS: Therapeutic exercises, Therapeutic activity, Neuromuscular re-education, Balance training, Gait training, Patient/Family education, Self Care, Joint mobilization, Stair training, Vestibular training, Prosthetic training, DME instructions, Manual therapy, and Re-evaluation  PLAN FOR NEXT SESSION recumbent bike, overground tolerance and working on step up efficiency, upright posture, standing balance, endurance, core strength, unilateral support reaching all directions (forward/laterally/to floor/crossbody), forward T w/ unilateral support (kick prosthetic back), seated  boxing w/ bag, Pt would like to weigh at the start of each session, patient wants to be able to pick up objects from floor, standing bird dogs, ASSESS STGs!  Elease Etienne, PT, DPT 03/24/2022, 8:50 AM

## 2022-03-27 DIAGNOSIS — I129 Hypertensive chronic kidney disease with stage 1 through stage 4 chronic kidney disease, or unspecified chronic kidney disease: Secondary | ICD-10-CM | POA: Diagnosis not present

## 2022-03-27 DIAGNOSIS — E669 Obesity, unspecified: Secondary | ICD-10-CM | POA: Diagnosis not present

## 2022-03-27 DIAGNOSIS — D631 Anemia in chronic kidney disease: Secondary | ICD-10-CM | POA: Diagnosis not present

## 2022-03-27 DIAGNOSIS — Z794 Long term (current) use of insulin: Secondary | ICD-10-CM | POA: Diagnosis not present

## 2022-03-27 DIAGNOSIS — I5032 Chronic diastolic (congestive) heart failure: Secondary | ICD-10-CM | POA: Diagnosis not present

## 2022-03-27 DIAGNOSIS — E876 Hypokalemia: Secondary | ICD-10-CM | POA: Diagnosis not present

## 2022-03-27 DIAGNOSIS — N2581 Secondary hyperparathyroidism of renal origin: Secondary | ICD-10-CM | POA: Diagnosis not present

## 2022-03-27 DIAGNOSIS — N184 Chronic kidney disease, stage 4 (severe): Secondary | ICD-10-CM | POA: Diagnosis not present

## 2022-03-27 DIAGNOSIS — E1122 Type 2 diabetes mellitus with diabetic chronic kidney disease: Secondary | ICD-10-CM | POA: Diagnosis not present

## 2022-03-27 DIAGNOSIS — Z86718 Personal history of other venous thrombosis and embolism: Secondary | ICD-10-CM | POA: Diagnosis not present

## 2022-03-28 ENCOUNTER — Ambulatory Visit (HOSPITAL_COMMUNITY)
Admission: RE | Admit: 2022-03-28 | Discharge: 2022-03-28 | Disposition: A | Discharge status: Home / Self Care | Payer: Medicare Other | Source: Ambulatory Visit | Attending: Nephrology | Admitting: Nephrology

## 2022-03-28 VITALS — BP 134/74 | HR 77 | Temp 97.3°F | Resp 18

## 2022-03-28 DIAGNOSIS — D631 Anemia in chronic kidney disease: Secondary | ICD-10-CM | POA: Insufficient documentation

## 2022-03-28 DIAGNOSIS — N183 Chronic kidney disease, stage 3 unspecified: Secondary | ICD-10-CM | POA: Diagnosis not present

## 2022-03-28 LAB — RENAL FUNCTION PANEL
Albumin: 3.5 g/dL (ref 3.5–5.0)
Anion gap: 12 (ref 5–15)
BUN: 88 mg/dL — ABNORMAL HIGH (ref 6–20)
CO2: 23 mmol/L (ref 22–32)
Calcium: 9.2 mg/dL (ref 8.9–10.3)
Chloride: 100 mmol/L (ref 98–111)
Creatinine, Ser: 3.81 mg/dL — ABNORMAL HIGH (ref 0.61–1.24)
GFR, Estimated: 19 mL/min — ABNORMAL LOW (ref >60)
Glucose, Bld: 74 mg/dL (ref 70–99)
Phosphorus: 4.8 mg/dL — ABNORMAL HIGH (ref 2.5–4.6)
Potassium: 3.8 mmol/L (ref 3.5–5.1)
Sodium: 135 mmol/L (ref 135–145)

## 2022-03-28 LAB — IRON AND TIBC
Iron: 45 ug/dL (ref 45–182)
Saturation Ratios: 16 % — ABNORMAL LOW (ref 17.9–39.5)
TIBC: 291 ug/dL (ref 250–450)
UIBC: 246 ug/dL

## 2022-03-28 LAB — FERRITIN: Ferritin: 400 ng/mL — ABNORMAL HIGH (ref 24–336)

## 2022-03-28 LAB — POCT HEMOGLOBIN-HEMACUE: Hemoglobin: 11.4 g/dL — ABNORMAL LOW (ref 13.0–17.0)

## 2022-03-28 MED ORDER — DARBEPOETIN ALFA 200 MCG/0.4ML IJ SOSY
PREFILLED_SYRINGE | INTRAMUSCULAR | Status: AC
  Administered 2022-03-28: 200 ug via SUBCUTANEOUS
  Filled 2022-03-28: qty 0.4

## 2022-03-28 MED ORDER — DARBEPOETIN ALFA 200 MCG/0.4ML IJ SOSY: 200.0000 ug | PREFILLED_SYRINGE | INTRAMUSCULAR | Status: DC

## 2022-03-29 LAB — PTH, INTACT AND CALCIUM
Calcium, Total (PTH): 8.9 mg/dL (ref 8.7–10.2)
PTH: 135 pg/mL — ABNORMAL HIGH (ref 15–65)

## 2022-03-30 ENCOUNTER — Ambulatory Visit: Payer: Medicare Other | Admitting: Physical Therapy

## 2022-04-06 ENCOUNTER — Encounter: Payer: Self-pay | Admitting: Physical Therapy

## 2022-04-06 ENCOUNTER — Other Ambulatory Visit: Payer: Self-pay | Admitting: Student

## 2022-04-06 ENCOUNTER — Ambulatory Visit: Payer: Medicare Other | Admitting: Physical Therapy

## 2022-04-06 VITALS — BP 120/69 | HR 88

## 2022-04-06 DIAGNOSIS — M6281 Muscle weakness (generalized): Secondary | ICD-10-CM

## 2022-04-06 DIAGNOSIS — I5032 Chronic diastolic (congestive) heart failure: Secondary | ICD-10-CM

## 2022-04-06 DIAGNOSIS — R2681 Unsteadiness on feet: Secondary | ICD-10-CM | POA: Diagnosis not present

## 2022-04-06 DIAGNOSIS — R2689 Other abnormalities of gait and mobility: Secondary | ICD-10-CM

## 2022-04-06 NOTE — Therapy (Signed)
OUTPATIENT PHYSICAL THERAPY PROSTHETICS TREATMENT / DISCHARGE   Patient Name: [name] MRN: [id number] DOB:03-18-1975, 47 y.o., male Today's Date: 04/06/2022  PCP: Starlyn Skeans, MD REFERRING PROVIDER: Charise Killian, MD  END OF SESSION:  PT End of Session - 04/06/22 1021     Visit Number 17    Number of Visits 25    Date for PT Re-Evaluation 04/21/22    Authorization Type MEDICARE PART A AND B    PT Start Time 1018    PT Stop Time 1105    PT Time Calculation (min) 47 min    Equipment Utilized During Treatment Gait belt    Activity Tolerance Patient tolerated treatment well    Behavior During Therapy WFL for tasks assessed/performed             Past Medical History:  Diagnosis Date   Acute venous embolism and thrombosis of deep vessels of proximal lower extremity ([name]) 07/19/2011   Anemia    Anginal pain (HCC)    pt denies   Anxiety    Chest pain, neg MI, normal coronaries by cath 02/18/2013   pt denies   CHF (congestive heart failure) (HCC)    Chronic renal disease, stage IV ([place]) 02/19/2013   Colon polyps    Depression    Diabetic ulcer of right foot ([place])    DVT (deep venous thrombosis) ([place]) 09/2002   patient reports additional DVTs in '06 & '11 (unconfirmed)   ED (erectile dysfunction)    GERD (gastroesophageal reflux disease)    History of blood transfusion    "related to OR" (10/31/2016)   Hyperlipidemia 02/19/2013   Hypertension    Nausea & vomiting    "constant for the last couple weeks" (10/31/2016)   Nephrotic syndrome    Obesity    BMI 44, weight 346 pounds 01/30/14   OSA (obstructive sleep apnea) 02/28/2018   Mild obstructive sleep apnea with an AHI of 9.8/h but severe during REM sleep with an AHI of 33.8/h.  Oxygen saturations dropped to 86% and there was moderate snoring   Peripheral edema    Pneumonia    Prosthesis adjustment 08/17/2016   Pulmonary embolism ([place]) 09/2002   treated with 6 months of warfarin   Pyelonephritis 02/02/2018    Type I diabetes mellitus ([place]) dx'd 2001   Urinary retention    Past Surgical History:  Procedure Laterality Date   AMPUTATION Right 12/22/2013   Procedure: AMPUTATION BELOW KNEE;  Surgeon: [name], MD;  Location: [place];  Service: Orthopedics;  Laterality: Right;   AMPUTATION Right 02/19/2014   Procedure: RIGHT ABOVE KNEE AMPUTATION ;  Surgeon: [name], MD;  Location: [place];  Service: Orthopedics;  Laterality: Right;   blood clot removal  2016   CARDIAC CATHETERIZATION  02/18/2013   normal coronaries   ESOPHAGOGASTRODUODENOSCOPY N/A 11/02/2016   Procedure: ESOPHAGOGASTRODUODENOSCOPY (EGD);  Surgeon: [name], MD;  Location: [facility] ENDOSCOPY;  Service: Endoscopy;  Laterality: N/A;   I & D EXTREMITY Right 12/14/2013   Procedure: IRRIGATION AND DEBRIDEMENT RIGHT FOOT;  Surgeon: [name], MD;  Location: [place];  Service: Orthopedics;  Laterality: Right;   INCISION AND DRAINAGE ABSCESS  2007; 2015   "back"   INCISION AND DRAINAGE OF WOUND Right 12/22/2013   Procedure: I&D RIGHT BUTTOCK;  Surgeon: [name], MD;  Location: [place];  Service: Orthopedics;  Laterality: Right;   LEFT HEART CATHETERIZATION WITH CORONARY ANGIOGRAM N/A 02/18/2013   Procedure: LEFT HEART CATHETERIZATION WITH CORONARY ANGIOGRAM;  Surgeon: [name], MD;  Location: [facility] CATH LAB;  Service: Cardiovascular;  Laterality: N/A;   Patient Active Problem List   Diagnosis Date Noted   Diabetic neuropathy ([place]) 01/24/2022   Trapezius strain, left, initial encounter 02/02/2021   Left shoulder pain 12/30/2020   Acute pulmonary edema (HCC)    Benign paroxysmal positional vertigo 09/14/2020   Anemia associated with chronic renal failure    Hemoglobinuria 12/12/2018   OSA (obstructive sleep apnea) 02/28/2018   Chronic diastolic heart failure ([place]) 02/02/2018   Preventative health care 08/15/2017   Erectile dysfunction 04/22/2017   History of pulmonary embolus (PE) 06/09/2016   Depression 05/04/2016    Hypogonadism in male 05/04/2016   Urinary retention 05/04/2016   Dyslipidemia 08/12/2014   Unilateral AKA, right ([place]) 12/26/2013   Hyperlipidemia 02/19/2013   S/P cardiac cath, 02/18/13, normal coronaries 02/19/2013   Chronic renal insufficiency, stage 3 (moderate) ([place]) 02/19/2013   Hypertension 07/24/2011   Long term current use of anticoagulant therapy 07/19/2011   History of DVT (deep vein thrombosis) 07/28/2009   Morbid obesity ([place]) 10/26/2005   Insulin dependent type 2 diabetes mellitus, controlled ([place]) 01/09/2002    ONSET DATE: 09/15/2021 (referral date)  REFERRING DIAG: [id number] (ICD-10-CM) - Unilateral AKA, right ([place])  THERAPY DIAG:  Other abnormalities of gait and mobility  Muscle weakness (generalized)  Unsteadiness on feet  Rationale for Evaluation and Treatment: Rehabilitation  SUBJECTIVE:   SUBJECTIVE STATEMENT: Pt reports that he is sore today due to doing glute bridges. Denies falls/near falls. Missed last week due to getting appointment time wrong.   Pt accompanied by: self-came via transportation  PERTINENT HISTORY: Insulin dependent DM2, hyperlipidemia, right AKA, chronic diastolic heart failure, HTN, BPPV  PAIN:  Are you having pain? Yes: NPRS scale: 3/10 Pain location: glutes Pain description: workout sore Aggravating factors: working out Relieving factors: rest/heat  PRECAUTIONS: Fall  WEIGHT BEARING RESTRICTIONS: No  FALLS: Has patient fallen in last 6 months? No  LIVING ENVIRONMENT: Lives with: lives alone Lives in: House/apartment Home Access: Level entry Home layout: One level Stairs: No Has following equipment at home: Lobbyist, Environmental consultant - 2 wheeled, Wheelchair (manual), Electronics engineer, and Grab bars  PLOF: Independent with household mobility with device, Independent with community mobility with device, Independent with transfers, and Needs assistance with homemaking  PATIENT GOALS: "To learn how to walk with this  (gestures to prosthetic) effectively, build my endurance and get out of this chair!"  OBJECTIVE:  DIAGNOSTIC FINDINGS: No recent relevant imaging.1   CURRENT PROSTHETIC WEAR ASSESSMENT: Patient is independent with: skin check, residual limb care, care of non-amputated limb, prosthetic cleaning, ply sock cleaning, correct ply sock adjustment, proper wear schedule/adjustment, and proper weight-bearing schedule/adjustment Patient is dependent with:  N/A Donning prosthesis: Complete Independence Doffing prosthesis: Complete Independence Prosthetic wear tolerance: 4-8 hours/day, 7 days/week; He is limited in his wear schedule because prosthetic does not fit under desk in class and lab. Prosthetic weight bearing tolerance: roughly 5 minutes Edema: pt reports stable limb edema Residual limb condition: Pt reports no open wounds, prefers to not doff liner. Prosthetic description: TF lanyard w/ [place] HD 4-bar mechanical knee (w/ manual lock) - pt states knee is hydraulic K code/activity level with prosthetic use: Level 2 *Remains unchanged 03/23/2022  TODAY'S TREATMENT:  Blood glucose at start of session: 74 rised to 80 in a few minutes  TherAct: - 10MWT w/ RW:  26.00 seconds = 0.38 m/sec OR  ft/sec (w/c follow) (SBA) - 2MWT w/ RW and w/c follow = 115' or 30m (SGA) - FxSTS: attempted initial trial with RUE x 3 sit to stands but unable to come to full stand, unable to attempt again due to elbow pain - Standing balance: 26" with WBOS (other trials 3 x 4-6") (SBA)  Education on results and reason for D/C. Provided education on safety awareness and when to return to therapy.   PATIENT EDUCATION: Education details: Continue HEP, when to return to therapy Person educated: Patient Education method: Theatre stage manager Education comprehension: verbalized  understanding  HOME EXERCISE PROGRAM:  - Dead bug with green theraband 2 sets x 15 reps bil  Use of FWW + prosthetic leg with sit to stands during gym sessions in mornings  Printed for patient: -STS (use counter support if needed vs RW) -Standing march (kicking RLE straight) -Standing wide stance w/ counter support progressing to no UE support (use chair behind) *PT forgot to save to patient instructions following printing.  URL: [email or web address] Date: 01/19/2022 Prepared by: Malachi Carl  Exercises - Sidelying Hip Abduction (AKA)  - 1 x daily - 7 x weekly - 3 sets - 10 reps - Prone Hip Extension with Residual Limb (AKA)  - 1 x daily - 7 x weekly - 3 sets - 10 reps - Supine Bridge with Bilateral LE and Bolster for RLE (BKA)  - 1 x daily - 7 x weekly - 3 sets - 10 reps  Access Code: [email or web address] URL: [email or web address] Date: 01/26/2022 Prepared by: Malachi Carl  Exercises - Seated Calf Stretch with Strap  - 1 x daily - 7 x weekly - 3 sets - 30" hold - Prone Lying with Towel Roll (Hip Flexor Stretch) (BKA)  - 1 x daily - 7 x weekly - 1 sets - 5' hold - Prone Quadriceps Stretch with Strap  - 1 x daily - 7 x weekly - 3 sets - 30" hold   ASSESSMENT:  CLINICAL IMPRESSION: Patient is discharging from skilled physical therapy today due to achieving maximal rehab potential at this time. Patient has plateaued in current level of functional gains and achieved no short term goals at this time. Patient will continue to benefit from ongoing work on endurance/gait outside of therapy in order to have higher tolerance for therapeutic gains in future. Patient demonstrates appropriate awareness and ability to continue to work on things outside of physical therapy. Patient ongoing deficits include activity tolerance, falls risk, reduced residual limb LE strength, and balance. Patient progress has been limited in therapy due to blood sugar management, transportation  challenges, partial compliance with HEP, and need for continual socket adjustments to meet patient's changes in body habitus. Additional physical therapy services not indicated at this time for these reasons. Patient is agreeable.   OBJECTIVE IMPAIRMENTS: Abnormal gait, decreased activity tolerance, decreased balance, decreased endurance, decreased mobility, difficulty walking, decreased ROM, decreased strength, improper body mechanics, prosthetic dependency , and obesity.   ACTIVITY LIMITATIONS: carrying, lifting, bending, standing, squatting, stairs, transfers, and locomotion level  PARTICIPATION LIMITATIONS: meal prep, cleaning, laundry, and driving  PERSONAL FACTORS: Fitness, Time since onset of injury/illness/exacerbation, Transportation, and 1-2 comorbidities: insulin dependent DM2 and HTN  are also affecting patient's functional outcome.   REHAB POTENTIAL: Good  CLINICAL DECISION MAKING: Evolving/moderate complexity  EVALUATION COMPLEXITY: Moderate   UPDATED  GOALS: Goals reviewed with patient? Yes  SHORT TERM GOALS: Target date: 03/24/2022  1.  Pt will decrease 5xSTS to </=13 seconds w/ use of only one hand on walker for support in order to demonstrate decreased risk for falls and improved functional bilateral LE strength and power. Baseline:  13.98" with RUE support in // bars on 02/10/2022; unable to tolerate today due to bilateral elbow pain Goal status: NOT MET  2. Patient will improve 2MWT score to 45 meters to demonstrate improvement in aerobic capacity and endurance needed for ambulating in the community.   Baseline: 115' OR 24m (2/15); 115' or 40m (3/28) Goal status: NOT MET  3.  Patient will improve gait speed to 0.45 m/s or greater with LRAD to indicate an improvement from being a household ambulator to limited community ambulator.   Baseline: 0.37 m/sec w/ RW (2/15); 0.38 m/sec OR  ft/sec (w/c follow) (SBA) Goal status: NOT MET  4.  Patient will demonstrate ability to  stand unsupported with wide base gait for at least 1 minute without LOB to improve static stability needed for safety within home setting. Baseline: 25.75 seconds (2/15); 26" with WBOS (other trials 3 x 4-6") (SBA) Goal status: NOT MET  LONG TERM GOALS: Target date: 04/21/2022  Pt will be independent with finalized and advanced strengthening HEP for use in the gym at to maintain progress at home. Baseline: Reviewed and pt compliant, needs advancement to standing endurance/balance (2/15); able to continue independently and verbalized appropriate understanding of what to do outside of therap Goal status: MET  2.   Patient will improve 2MWT score to 55 meters to demonstrate improvement in aerobic capacity and endurance needed for ambulating in the community.   Baseline: 115' OR 29m (2/15); same distance on 04/06/2022 Goal status:  NOT MET  3. Patient will improve gait speed to 0.6 m/s to indicate improvement to the level of limited community ambulator in order to participate more easily in activities outside of the home.    Baseline:  0.37 m/sec w/ RW (2/15); 0.38 m/sec OR  ft/sec (w/c follow) (SBA) Goal status: NOT MET  4.   Patient will demonstrate ability to stand unsupported with wide base gait for at least 2 minutes without LOB to improve static stability needed for safety within home setting. Baseline:  25.75 seconds (2/15); 26" with WBOS (other trials 3 x 4-6") (SBA) Goal status: NOT MET  PLAN:  PT FREQUENCY: 1x/week  PT DURATION: 8 weeks  PLANNED INTERVENTIONS: Therapeutic exercises, Therapeutic activity, Neuromuscular re-education, Balance training, Gait training, Patient/Family education, Self Care, Joint mobilization, Stair training, Vestibular training, Prosthetic training, DME instructions, Manual therapy, and Re-evaluation  PLAN FOR NEXT SESSION Not indicated at this time  Malachi Carl, PT, DPT 04/06/2022, 12:26 PM  PHYSICAL THERAPY DISCHARGE SUMMARY  Visits from Start of  Care: 17  Current functional level related to goals / functional outcomes: Achieved 0/4 STG, 1/4 LTG   Remaining deficits: Activity tolerance, balance, gait, increased risk for falls   Education / Equipment: When to return to the therapy, safety measures, why D/C today given plateau    Patient agrees to discharge. Patient goals were not met. Patient is being discharged due to lack of progress.

## 2022-04-12 ENCOUNTER — Telehealth: Payer: Self-pay

## 2022-04-12 MED ORDER — TIRZEPATIDE 12.5 MG/0.5ML ~~LOC~~ SOAJ: 12.5000 mg | SUBCUTANEOUS | 1 refills | Status: DC

## 2022-04-12 NOTE — Telephone Encounter (Signed)
Patient think that he has plato as far as weight loss but feels that sugar have been control  7day average 116  14 day average 123  30 day average 128  90 day average 124

## 2022-04-12 NOTE — Telephone Encounter (Signed)
Patient left a vm that he is ready to be increased to the 12.5mg  dose of Mounjaro.

## 2022-04-12 NOTE — Telephone Encounter (Signed)
Patient advised.

## 2022-04-13 ENCOUNTER — Ambulatory Visit: Payer: Medicare Other | Admitting: Physical Therapy

## 2022-04-13 ENCOUNTER — Other Ambulatory Visit: Payer: Self-pay

## 2022-04-14 ENCOUNTER — Other Ambulatory Visit: Payer: Self-pay | Admitting: Student

## 2022-04-14 ENCOUNTER — Other Ambulatory Visit: Payer: Self-pay | Admitting: Internal Medicine

## 2022-04-17 ENCOUNTER — Telehealth: Payer: Self-pay

## 2022-04-17 ENCOUNTER — Other Ambulatory Visit: Payer: Self-pay

## 2022-04-17 MED ORDER — NOVOLOG FLEXPEN 100 UNIT/ML ~~LOC~~ SOPN: 15.0000 [IU] | PEN_INJECTOR | Freq: Three times a day (TID) | SUBCUTANEOUS | 0 refills | Status: DC

## 2022-04-17 NOTE — Telephone Encounter (Signed)
Decision:Approved [name] (Key: [id number]) PA Case ID #: [id number] Rx #: [id number] Need Help? Call us at [phone] Outcome Approved today Approved. This drug has been approved under the Member's Medicare Part D benefit. Approved quantity: 15 units per 20 day(s). You may fill up to a 90 day supply except for those on Specialty Tier 5, which can be filled up to a 30 day supply. Please call the pharmacy to process the prescription claim. Authorization Expiration Date: 01/08/2098 Drug NovoLOG FlexPen 100UNIT/ML pen-injectors ePA cloud logo Form [facility] Medicare Electronic Prior Authorization Request Form [phone] NCPDP) Original Claim Info [id number]   Approval has been faxed to the pharmacy

## 2022-04-17 NOTE — Telephone Encounter (Signed)
Prior Authorization for patient [employer]) came through on cover my meds was submitted with last office notes and labs awaiting approval or denial

## 2022-04-18 ENCOUNTER — Ambulatory Visit (HOSPITAL_COMMUNITY)
Admission: RE | Admit: 2022-04-18 | Discharge: 2022-04-18 | Disposition: A | Discharge status: Home / Self Care | Payer: Medicare Other | Source: Ambulatory Visit | Attending: Nephrology | Admitting: Nephrology

## 2022-04-18 VITALS — BP 141/61 | HR 79 | Temp 97.3°F | Resp 18

## 2022-04-18 DIAGNOSIS — N183 Chronic kidney disease, stage 3 unspecified: Secondary | ICD-10-CM | POA: Insufficient documentation

## 2022-04-18 DIAGNOSIS — D631 Anemia in chronic kidney disease: Secondary | ICD-10-CM | POA: Insufficient documentation

## 2022-04-18 LAB — RENAL FUNCTION PANEL
Albumin: 3.3 g/dL — ABNORMAL LOW (ref 3.5–5.0)
Anion gap: 9 (ref 5–15)
BUN: 66 mg/dL — ABNORMAL HIGH (ref 6–20)
CO2: 23 mmol/L (ref 22–32)
Calcium: 8.7 mg/dL — ABNORMAL LOW (ref 8.9–10.3)
Chloride: 104 mmol/L (ref 98–111)
Creatinine, Ser: 3.31 mg/dL — ABNORMAL HIGH (ref 0.61–1.24)
GFR, Estimated: 22 mL/min — ABNORMAL LOW (ref >60)
Glucose, Bld: 109 mg/dL — ABNORMAL HIGH (ref 70–99)
Phosphorus: 3.6 mg/dL (ref 2.5–4.6)
Potassium: 3.8 mmol/L (ref 3.5–5.1)
Sodium: 136 mmol/L (ref 135–145)

## 2022-04-18 LAB — IRON AND TIBC
Iron: 66 ug/dL (ref 45–182)
Saturation Ratios: 23 % (ref 17.9–39.5)
TIBC: 287 ug/dL (ref 250–450)
UIBC: 221 ug/dL

## 2022-04-18 LAB — FERRITIN: Ferritin: 289 ng/mL (ref 24–336)

## 2022-04-18 LAB — POCT HEMOGLOBIN-HEMACUE: Hemoglobin: 10.4 g/dL — ABNORMAL LOW (ref 13.0–17.0)

## 2022-04-18 MED ORDER — DARBEPOETIN ALFA 200 MCG/0.4ML IJ SOSY
PREFILLED_SYRINGE | INTRAMUSCULAR | Status: AC
  Filled 2022-04-18: qty 0.4

## 2022-04-18 MED ORDER — DARBEPOETIN ALFA 200 MCG/0.4ML IJ SOSY
200.0000 ug | PREFILLED_SYRINGE | INTRAMUSCULAR | Status: DC
  Administered 2022-04-18: 200 ug via SUBCUTANEOUS

## 2022-04-19 LAB — PTH, INTACT AND CALCIUM
Calcium, Total (PTH): 8.7 mg/dL (ref 8.7–10.2)
PTH: 153 pg/mL — ABNORMAL HIGH (ref 15–65)

## 2022-04-20 ENCOUNTER — Encounter: Payer: Medicare Other | Admitting: Physical Therapy

## 2022-05-04 ENCOUNTER — Other Ambulatory Visit: Payer: Self-pay

## 2022-05-04 ENCOUNTER — Ambulatory Visit (INDEPENDENT_AMBULATORY_CARE_PROVIDER_SITE_OTHER): Payer: Medicare Other

## 2022-05-04 VITALS — BP 127/72 | HR 87 | Temp 97.8°F | Ht 74.0 in

## 2022-05-04 DIAGNOSIS — S46812A Strain of other muscles, fascia and tendons at shoulder and upper arm level, left arm, initial encounter: Secondary | ICD-10-CM

## 2022-05-04 DIAGNOSIS — M7702 Medial epicondylitis, left elbow: Secondary | ICD-10-CM | POA: Diagnosis present

## 2022-05-04 MED ORDER — METHOCARBAMOL 500 MG PO TABS: 500.0000 mg | ORAL_TABLET | Freq: Every evening | ORAL | 0 refills | Status: DC | PRN

## 2022-05-04 NOTE — Patient Instructions (Signed)
[name], it was a pleasure seeing you today! You endorsed feeling well today. Below are some of the things we talked about this visit. We look forward to seeing you in the follow up appointment!  Today we discussed: Tennis elbow: use voltaren gel on the elbow up to 4 times a day and ice to help with swelling. Restrict activity of flexion and extension of the elbow.   I have ordered the following labs today:  Lab Orders  No laboratory test(s) ordered today      Referrals ordered today:   Referral Orders  No referral(s) requested today     I have ordered the following medication/changed the following medications:   Stop the following medications: Medications Discontinued During This Encounter  Medication Reason   cyclobenzaprine (FLEXERIL) 5 MG tablet Completed Course   methocarbamol (ROBAXIN) 500 MG tablet Reorder     Start the following medications: Meds ordered this encounter  Medications   methocarbamol (ROBAXIN) 500 MG tablet    Sig: Take 1 tablet (500 mg total) by mouth at bedtime as needed for muscle spasms.    Dispense:  15 tablet    Refill:  0     Follow-up:  4-8 weeks    Please make sure to arrive 15 minutes prior to your next appointment. If you arrive late, you may be asked to reschedule.   We look forward to seeing you next time. Please call our clinic at [phone] if you have any questions or concerns. The best time to call is Monday-Friday from 9am-4pm, but there is someone available 24/7. If after hours or the weekend, call the main hospital number and ask for the Internal Medicine Resident On-Call. If you need medication refills, please notify your pharmacy one week in advance and they will send [place] a request.  Thank you for letting us take part in your care. Wishing you the best!  Thank you, Lyndle Herrlich MD

## 2022-05-06 DIAGNOSIS — M7702 Medial epicondylitis, left elbow: Secondary | ICD-10-CM | POA: Insufficient documentation

## 2022-05-06 NOTE — Progress Notes (Signed)
   CC: elbow pain  HPI:  [name] is a 47 y.o.-year-old male with past medical history as below presenting for elbow pain.  Please see encounters tab for problem-based charting.  Past Medical History:  Diagnosis Date   Acute venous embolism and thrombosis of deep vessels of proximal lower extremity (HCC) 07/19/2011   Anemia    Anginal pain (HCC)    pt denies   Anxiety    Chest pain, neg MI, normal coronaries by cath 02/18/2013   pt denies   CHF (congestive heart failure) (HCC)    Chronic renal disease, stage IV (HCC) 02/19/2013   Colon polyps    Depression    Diabetic ulcer of right foot (HCC)    DVT (deep venous thrombosis) (HCC) 09/2002   patient reports additional DVTs in '06 & '11 (unconfirmed)   ED (erectile dysfunction)    GERD (gastroesophageal reflux disease)    History of blood transfusion    "related to OR" (10/31/2016)   Hyperlipidemia 02/19/2013   Hypertension    Nausea & vomiting    "constant for the last couple weeks" (10/31/2016)   Nephrotic syndrome    Obesity    BMI 44, weight 346 pounds 01/30/14   OSA (obstructive sleep apnea) 02/28/2018   Mild obstructive sleep apnea with an AHI of 9.8/h but severe during REM sleep with an AHI of 33.8/h.  Oxygen saturations dropped to 86% and there was moderate snoring   Peripheral edema    Pneumonia    Prosthesis adjustment 08/17/2016   Pulmonary embolism (HCC) 09/2002   treated with 6 months of warfarin   Pyelonephritis 02/02/2018   Type I diabetes mellitus (HCC) dx'd 2001   Urinary retention    Review of Systems: As in HPI.  Please see encounters tab for problem based charting.  Physical Exam:  Vitals:   05/04/22 1022  BP: 127/72  Pulse: 87  Temp: 97.8 F (36.6 C)  TempSrc: Oral  SpO2: 94%  Height: 6\' 2"  (1.88 m)   General:Well-appearing, pleasant, In NAD Cardiac: RRR, no murmurs rubs or gallops. Respiratory: Normal work of breathing on room air, CTAB Abdominal: Soft, nontender,  nondistended Left elbow: No warmth or erythema around the area. AROM restricted by pain but full PROM. 4/5 strength elbow extension limited by pain, otherwise 5/5. No sensory deficits. Edema of medial epiccondylitis and olecranon bursa  Assessment & Plan:   Medial epicondylitis of elbow, left Presents for pain and swelling of left elbow that started a week ago. He has been exerting himself more at the gym and trying to lift heavier weights recently. Reports no redness of the joint, fevers, trauma to the area. He has been using ice and Bengay on the elbow with some improvement in the effusion, but he is still going to the gym, though trying to avoid using the left elbow too much.   Exam reveals medial epicondylitis and olecranon bursitis. No warmth or erythema around the area. AROM restricted by pain but full PROM. 4/5 strength elbow extension limited by pain, otherwise 5/5. No sensory deficits. Low concern for gout or joint infection at this time, likely medial epicondylitis and olecranon bursitis with overexertion at the gym -counseled activity restriction for left elbow. Can use brace as well -ice, topical NSAIDS, tylenol PRN -requested short supply of robaxin for muscle spasms, refilled -follow up in 4-8 weeks    Patient discussed with Dr.  Lafonda Mosses

## 2022-05-06 NOTE — Assessment & Plan Note (Signed)
Presents for pain and swelling of left elbow that started a week ago. He has been exerting himself more at the gym and trying to lift heavier weights recently. Reports no redness of the joint, fevers, trauma to the area. He has been using ice and Bengay on the elbow with some improvement in the effusion, but he is still going to the gym, though trying to avoid using the left elbow too much.   Exam reveals medial epicondylitis and olecranon bursitis. No warmth or erythema around the area. AROM restricted by pain but full PROM. 4/5 strength elbow extension limited by pain, otherwise 5/5. No sensory deficits. Low concern for gout or joint infection at this time, likely medial epicondylitis and olecranon bursitis with overexertion at the gym -counseled activity restriction for left elbow. Can use brace as well -ice, topical NSAIDS, tylenol PRN -requested short supply of robaxin for muscle spasms, refilled -follow up in 4-8 weeks

## 2022-05-08 ENCOUNTER — Ambulatory Visit (HOSPITAL_COMMUNITY)
Admission: RE | Admit: 2022-05-08 | Discharge: 2022-05-08 | Disposition: A | Discharge status: Home / Self Care | Payer: Medicare Other | Source: Ambulatory Visit | Attending: Nephrology | Admitting: Nephrology

## 2022-05-08 VITALS — BP 143/64 | HR 85 | Temp 97.9°F | Resp 18

## 2022-05-08 DIAGNOSIS — N183 Chronic kidney disease, stage 3 unspecified: Secondary | ICD-10-CM | POA: Diagnosis not present

## 2022-05-08 LAB — RENAL FUNCTION PANEL
Albumin: 3.2 g/dL — ABNORMAL LOW (ref 3.5–5.0)
Anion gap: 10 (ref 5–15)
BUN: 73 mg/dL — ABNORMAL HIGH (ref 6–20)
CO2: 23 mmol/L (ref 22–32)
Calcium: 8.8 mg/dL — ABNORMAL LOW (ref 8.9–10.3)
Chloride: 101 mmol/L (ref 98–111)
Creatinine, Ser: 3.44 mg/dL — ABNORMAL HIGH (ref 0.61–1.24)
GFR, Estimated: 21 mL/min — ABNORMAL LOW (ref >60)
Glucose, Bld: 151 mg/dL — ABNORMAL HIGH (ref 70–99)
Phosphorus: 4.3 mg/dL (ref 2.5–4.6)
Potassium: 4 mmol/L (ref 3.5–5.1)
Sodium: 134 mmol/L — ABNORMAL LOW (ref 135–145)

## 2022-05-08 LAB — IRON AND TIBC
Iron: 52 ug/dL (ref 45–182)
Saturation Ratios: 19 % (ref 17.9–39.5)
TIBC: 272 ug/dL (ref 250–450)
UIBC: 220 ug/dL

## 2022-05-08 LAB — FERRITIN: Ferritin: 375 ng/mL — ABNORMAL HIGH (ref 24–336)

## 2022-05-08 LAB — POCT HEMOGLOBIN-HEMACUE: Hemoglobin: 10.2 g/dL — ABNORMAL LOW (ref 13.0–17.0)

## 2022-05-08 MED ORDER — DARBEPOETIN ALFA 200 MCG/0.4ML IJ SOSY
PREFILLED_SYRINGE | INTRAMUSCULAR | Status: AC
  Administered 2022-05-08: 200 ug via SUBCUTANEOUS
  Filled 2022-05-08: qty 0.4

## 2022-05-08 MED ORDER — DARBEPOETIN ALFA 200 MCG/0.4ML IJ SOSY: 200.0000 ug | PREFILLED_SYRINGE | INTRAMUSCULAR | Status: DC

## 2022-05-08 NOTE — Progress Notes (Signed)
Internal Medicine Clinic Attending  Case discussed with Dr. Sridharan  At the time of the visit.  We reviewed the resident's history and exam and pertinent patient test results.  I agree with the assessment, diagnosis, and plan of care documented in the resident's note.  

## 2022-05-09 ENCOUNTER — Other Ambulatory Visit (INDEPENDENT_AMBULATORY_CARE_PROVIDER_SITE_OTHER): Payer: Medicare Other

## 2022-05-09 ENCOUNTER — Encounter (HOSPITAL_COMMUNITY): Payer: Medicare Other

## 2022-05-09 DIAGNOSIS — E1165 Type 2 diabetes mellitus with hyperglycemia: Secondary | ICD-10-CM | POA: Diagnosis not present

## 2022-05-09 DIAGNOSIS — Z794 Long term (current) use of insulin: Secondary | ICD-10-CM | POA: Diagnosis not present

## 2022-05-09 LAB — HEMOGLOBIN A1C: Hgb A1c MFr Bld: 5.2 % (ref 4.6–6.5)

## 2022-05-09 LAB — GLUCOSE, RANDOM: Glucose, Bld: 103 mg/dL — ABNORMAL HIGH (ref 70–99)

## 2022-05-09 LAB — PTH, INTACT AND CALCIUM
Calcium, Total (PTH): 8.8 mg/dL (ref 8.7–10.2)
PTH: 189 pg/mL — ABNORMAL HIGH (ref 15–65)

## 2022-05-10 ENCOUNTER — Other Ambulatory Visit: Payer: Self-pay

## 2022-05-10 LAB — FRUCTOSAMINE: Fructosamine: 299 umol/L — ABNORMAL HIGH (ref 0–285)

## 2022-05-11 ENCOUNTER — Encounter: Payer: Self-pay | Admitting: Endocrinology

## 2022-05-11 ENCOUNTER — Ambulatory Visit (INDEPENDENT_AMBULATORY_CARE_PROVIDER_SITE_OTHER): Payer: Medicare HMO | Admitting: Endocrinology

## 2022-05-11 DIAGNOSIS — Z794 Long term (current) use of insulin: Secondary | ICD-10-CM | POA: Diagnosis not present

## 2022-05-11 DIAGNOSIS — E119 Type 2 diabetes mellitus without complications: Secondary | ICD-10-CM | POA: Diagnosis not present

## 2022-05-11 MED ORDER — ACCU-CHEK GUIDE VI STRP
ORAL_STRIP | 3 refills | Status: DC
Start: 2022-05-11 — End: 2023-04-27

## 2022-05-11 MED ORDER — ACCU-CHEK GUIDE ME W/DEVICE KIT
PACK | 1 refills | Status: DC
Start: 2022-05-11 — End: 2023-04-27

## 2022-05-11 NOTE — Progress Notes (Signed)
Patient ID: [name], male   DOB: 04-21-1975, 47 y.o.   MRN: [id number]          Reason for Appointment: Follow-up for Type 2 Diabetes    History of Present Illness:          Date of diagnosis of type 2 diabetes mellitus:  2001      His weight was about 400 pounds at the time of diagnosis He does not know what his initial blood sugars and circumstances were but he apparently was started on insulin initially He was not able to tolerate metformin because of diarrhea Subsequently has been on insulin only, level of control not available but his A1c was significantly high at 12.8 in 2015  Background history:    Recent history:   INSULIN regimen is:   48 units Tresiba once daily   NovoLog 0 units breakfast 10-15  units before lunch and 15-20 units at dinner   Non-insulin hypoglycemic drugs the patient is taking are:  Mounjaro 12.5 mg weekly   Current management, blood sugar patterns and problems identified:  His A1c is 5.2, about the same Fructosamine 299 compared to 294,  Management, insulin regimen and problems:  He says he has better satiety with further increasing Mounjaro up to 12.5 mg He has better satiety but no nausea He appears to have lost about 20 pounds  However does not appear to have reduced any insulin doses  Currently no hypoglycemia although blood sugars are generally low normal sometimes after dinner  Overnight readings are still about the same with 48 units of Tresiba mg overnight, average slightly lower than before Blood sugars are not above the target range on his CGM and only 2% outside the target range being below 70 Although he continues to use the [place] 2 he is usually trying to monitor regularly with this except occasionally at bedtime Continues to do exercise as before in the morning Today after more vigorous exercise his blood sugar was slightly low despite having his usual breakfast of a bagel before eating without insulin coverage   Side  effects from medications have been: Diarrhea with metformin, vomiting with Ozempic  Glucose monitoring:  with freestyle libre version 2 with recent data as follows   Interpretation of his blood sugar patterns from his freestyle libre 2 through 8/24 are as follows: His time in range is almost 100% in the target range and only 2% below 7 0 OVERNIGHT blood sugars are slightly variable with relatively high readings around midnight but generally in the low 100 range without hypoglycemia  Premeal readings are the lowest around lunchtime averaging around 90 and rarely below 70 Blood sugar data was not present between 21st and 24th April POSTPRANDIAL readings are usually well-controlled with only mild increase after meals and in the evening generally a slight decrease  Blood sugars postexercise in the mornings are not getting low except for today  CGM use % of time 64  2-week average/GV 111  Time in range       98 %  % Time Above 180 0  % Time above 250   % Time Below 70 2     PRE-MEAL Fasting Lunch Dinner Bedtime Overall  Glucose range:       Averages: 118       POST-MEAL PC Breakfast PC Lunch PC Dinner  Glucose range:     Averages: 115 117 118   Previously:   CGM use % of time 77  2-week average/GV 144/27  Time in range      79%  % Time Above 180 19+1  % Time above 250   % Time Below 70 1     PRE-MEAL Fasting Lunch Dinner Bedtime Overall  Glucose range:       Averages: 127    144   POST-MEAL PC Breakfast PC Lunch PC Dinner  Glucose range:     Averages: 132 141 162      Self-care:    Typical meal intake: Breakfast is egg whites, some oatmeal/grits and banana.  Usually small portions of meat or other protein at lunch and dinner and only a small amount of sweet potato at lunch or carbohydrate              Dietician visit, most recent: Unknown               Weight history:  Wt Readings from Last 3 Encounters:  05/11/22 (!) 328 lb (148.8 kg)  01/24/22 (!) 349 lb 6.4  oz (158.5 kg)  01/24/22 (!) 349 lb 6.9 oz (158.5 kg)    Glycemic control:   Lab Results  Component Value Date   HGBA1C 5.2 05/09/2022   HGBA1C 5.2 12/30/2021   HGBA1C 5.4 08/23/2021   Lab Results  Component Value Date   MICROALBUR 160.61 (H) 08/14/2013   LDLCALC 60 12/30/2021   CREATININE 3.44 (H) 05/08/2022   Lab Results  Component Value Date   MICRALBCREAT 1,528.4 (H) 02/15/2017    Lab Results  Component Value Date   FRUCTOSAMINE 299 (H) 05/09/2022   FRUCTOSAMINE 294 (H) 08/23/2021   FRUCTOSAMINE 303 (H) 05/16/2021   Lab Results  Component Value Date   HGB 10.2 (L) 05/08/2022      Allergies as of 05/11/2022       Reactions   Reglan [metoclopramide] Other (See Comments)   Dysphoric reaction   Metformin And Related Diarrhea   Ozempic (0.25 Or 0.5 Mg-dose) [semaglutide(0.25 Or 0.5mg -dos)] Nausea And Vomiting        Medication List        Accurate as of May 11, 2022  2:32 PM. If you have any questions, ask your nurse or doctor.          STOP taking these medications    Accu-Chek Softclix Lancets lancets Stopped by: Reather Littler, MD       TAKE these medications    Accu-Chek Guide Me w/Device Kit Use to check blood sugar up to 3 times a day   Accu-Chek Guide test strip Generic drug: glucose blood Check blood sugar up to 3 times a day   accu-chek softclix lancets Use to check blood sugars 5 times a day. Dx code:E11.9. Insulin dependent. What changed:  how much to take how to take this when to take this   acidophilus Caps capsule Take 1 capsule by mouth daily.   Alpha-Lipoic Acid 600 MG Caps Take 600 mg by mouth daily.   amLODipine 10 MG tablet Commonly known as: NORVASC Take 10 mg by mouth at bedtime.   B-12 500 MCG Subl Place 500 mcg under the tongue daily.   B-D UF III MINI PEN NEEDLES 31G X 5 MM Misc Generic drug: Insulin Pen Needle 1 each by Other route See admin instructions. USE 4-5 TIMES DAILY   B-D UF III MINI PEN NEEDLES  31G X 5 MM Misc Generic drug: Insulin Pen Needle USE 1  NEEDLE FIVE TIMES DAILY   carvedilol 25 MG tablet Commonly known as: COREG TAKE 1  TABLET BY MOUTH TWICE DAILY WITH A MEAL   chlorthalidone 25 MG tablet Commonly known as: HYGROTON Take 25 mg by mouth daily.   cloNIDine 0.1 mg/24hr patch Commonly known as: CATAPRES - Dosed in mg/24 hr 0.1 mg once a week.   diclofenac Sodium 1 % Gel Commonly known as: Voltaren Apply 2 g topically 4 (four) times daily.   Eliquis 5 MG Tabs tablet Generic drug: apixaban Take 1 tablet by mouth twice daily   famotidine 20 MG tablet Commonly known as: PEPCID Take 1 tablet (20 mg total) by mouth 2 (two) times daily. What changed:  when to take this reasons to take this   fluticasone 50 MCG/ACT nasal spray Commonly known as: Flonase Place 2 sprays into both nostrils daily. What changed:  when to take this reasons to take this   furosemide 80 MG tablet Commonly known as: LASIX Take 1 tablet by mouth twice daily What changed: when to take this   gabapentin 100 MG capsule Commonly known as: NEURONTIN Take 1 capsule (100 mg total) by mouth 3 (three) times daily.   hydrALAZINE 100 MG tablet Commonly known as: APRESOLINE Take 100 mg by mouth 3 (three) times daily.   lidocaine 4 % Commonly known as: HM Lidocaine Patch Place 1 patch onto the skin daily.   lovastatin 20 MG tablet Commonly known as: MEVACOR Take 1 tablet (20 mg total) by mouth daily.   methocarbamol 500 MG tablet Commonly known as: ROBAXIN Take 1 tablet (500 mg total) by mouth at bedtime as needed for muscle spasms.   NovoLOG FlexPen 100 UNIT/ML FlexPen Generic drug: insulin aspart Inject 15-30 Units into the skin 3 (three) times daily with meals. Administer 15 units in the morning with breakfast, 25 units with lunch, and 30 units with dinner daily.  ICD Code:  E11.9   oxybutynin 5 MG tablet Commonly known as: DITROPAN TAKE 1 TABLET BY MOUTH THREE TIMES DAILY    polyethylene glycol 17 g packet Commonly known as: MIRALAX / GLYCOLAX Take 17 g by mouth 2 (two) times daily. What changed:  when to take this reasons to take this   potassium chloride SA 20 MEQ tablet Commonly known as: KLOR-CON M Take 20 mEq by mouth 2 (two) times daily.   senna-docusate 8.6-50 MG tablet Commonly known as: Senokot-S Take 1 tablet by mouth daily. What changed:  when to take this reasons to take this   sildenafil 50 MG tablet Commonly known as: VIAGRA Take 1-2 tablets (50-100 mg total) by mouth as needed for erectile dysfunction. Take 1 hour before needed. Do not take more than 2 tablets at once.   tamsulosin 0.4 MG Caps capsule Commonly known as: FLOMAX Take 1 capsule (0.4 mg total) by mouth at bedtime.   tirzepatide 12.5 MG/0.5ML Pen Commonly known as: MOUNJARO Inject 12.5 mg into the skin once a week. What changed: Another medication with the same name was removed. Continue taking this medication, and follow the directions you see here. Changed by: Reather Littler, MD   Evaristo Bury FlexTouch 200 UNIT/ML FlexTouch Pen Generic drug: insulin degludec INJECT 48 UNITS SUBCUTANEOUSLY ONCE DAILY        Allergies:  Allergies  Allergen Reactions   Reglan [Metoclopramide] Other (See Comments)    Dysphoric reaction   Metformin And Related Diarrhea   Ozempic (0.25 Or 0.5 Mg-Dose) [Semaglutide(0.25 Or 0.5mg -Dos)] Nausea And Vomiting    Past Medical History:  Diagnosis Date   Acute venous embolism and thrombosis of deep vessels of  proximal lower extremity (HCC) 07/19/2011   Anemia    Anginal pain (HCC)    pt denies   Anxiety    Chest pain, neg MI, normal coronaries by cath 02/18/2013   pt denies   CHF (congestive heart failure) (HCC)    Chronic renal disease, stage IV (HCC) 02/19/2013   Colon polyps    Depression    Diabetic ulcer of right foot (HCC)    DVT (deep venous thrombosis) (HCC) 09/2002   patient reports additional DVTs in '06 & '11  (unconfirmed)   ED (erectile dysfunction)    GERD (gastroesophageal reflux disease)    History of blood transfusion    "related to OR" (10/31/2016)   Hyperlipidemia 02/19/2013   Hypertension    Nausea & vomiting    "constant for the last couple weeks" (10/31/2016)   Nephrotic syndrome    Obesity    BMI 44, weight 346 pounds 01/30/14   OSA (obstructive sleep apnea) 02/28/2018   Mild obstructive sleep apnea with an AHI of 9.8/h but severe during REM sleep with an AHI of 33.8/h.  Oxygen saturations dropped to 86% and there was moderate snoring   Peripheral edema    Pneumonia    Prosthesis adjustment 08/17/2016   Pulmonary embolism (HCC) 09/2002   treated with 6 months of warfarin   Pyelonephritis 02/02/2018   Type I diabetes mellitus (HCC) dx'd 2001   Urinary retention     Past Surgical History:  Procedure Laterality Date   AMPUTATION Right 12/22/2013   Procedure: AMPUTATION BELOW KNEE;  Surgeon: Toni Arthurs, MD;  Location: [facility] OR;  Service: Orthopedics;  Laterality: Right;   AMPUTATION Right 02/19/2014   Procedure: RIGHT ABOVE KNEE AMPUTATION ;  Surgeon: Toni Arthurs, MD;  Location: [facility] OR;  Service: Orthopedics;  Laterality: Right;   blood clot removal  2016   CARDIAC CATHETERIZATION  02/18/2013   normal coronaries   ESOPHAGOGASTRODUODENOSCOPY N/A 11/02/2016   Procedure: ESOPHAGOGASTRODUODENOSCOPY (EGD);  Surgeon: Iva Boop, MD;  Location: [facility] ENDOSCOPY;  Service: Endoscopy;  Laterality: N/A;   I & D EXTREMITY Right 12/14/2013   Procedure: IRRIGATION AND DEBRIDEMENT RIGHT FOOT;  Surgeon: Verlee Rossetti, MD;  Location: [facility] OR;  Service: Orthopedics;  Laterality: Right;   INCISION AND DRAINAGE ABSCESS  2007; 2015   "back"   INCISION AND DRAINAGE OF WOUND Right 12/22/2013   Procedure: I&D RIGHT BUTTOCK;  Surgeon: Toni Arthurs, MD;  Location: [facility] OR;  Service: Orthopedics;  Laterality: Right;   LEFT HEART CATHETERIZATION WITH CORONARY ANGIOGRAM N/A 02/18/2013   Procedure: LEFT HEART  CATHETERIZATION WITH CORONARY ANGIOGRAM;  Surgeon: Peter M Swaziland, MD;  Location: [facility] CATH LAB;  Service: Cardiovascular;  Laterality: N/A;    Family History  Problem Relation Age of Onset   Heart disease Mother    Diabetes Mother    Diabetes Maternal Uncle    Diabetes Cousin     Social History:  reports that he has never smoked. He has never used smokeless tobacco. He reports current alcohol use. He reports that he does not use drugs.   Review of Systems   Lipid history: Has been on lovastatin 20 mg with labs as follows:    Lab Results  Component Value Date   CHOL 119 12/30/2021   HDL 38.10 (L) 12/30/2021   LDLCALC 60 12/30/2021   TRIG 104.0 12/30/2021   CHOLHDL 3 12/30/2021           Hypertension: He is taking clonidine, Norvasc, hydralazine and Coreg followed  by nephrologist  BP Readings from Last 3 Encounters:  05/11/22 124/78  05/08/22 (!) 143/64  05/04/22 127/72     Complications of diabetes: Nephropathy with renal insufficiency and proteinuria, unknown status of retinopathy, erectile dysfunction  Followed by nephrologist for CKD He is on ESA as well as iron infusions for anemia  Lab Results  Component Value Date   CREATININE 3.44 (H) 05/08/2022   CREATININE 3.31 (H) 04/18/2022   CREATININE 3.81 (H) 03/28/2022    Lab Results  Component Value Date   HGB 10.2 (L) 05/08/2022      LABS:  Lab on 05/09/2022  Component Date Value Ref Range Status   Glucose, Bld 05/09/2022 103 (H)  70 - 99 mg/dL Final   Fructosamine 16/10/9602 299 (H)  0 - 285 umol/L Final   Comment: Published reference interval for apparently healthy subjects between age 36 and 23 is 76 - 285 umol/L and in a poorly controlled diabetic population is 228 - 563 umol/L with a mean of 396 umol/L.    Hgb A1c MFr Bld 05/09/2022 5.2  4.6 - 6.5 % Final   Glycemic Control Guidelines for People with Diabetes:Non Diabetic:  <6%Goal of Therapy: <7%Additional Action Suggested:  >8%   Hospital  Outpatient Visit on 05/08/2022  Component Date Value Ref Range Status   Ferritin 05/08/2022 375 (H)  24 - 336 ng/mL Final   Performed at [facility] Lab, [address] [place], [place] [address]   Iron 05/08/2022 52  45 - 182 ug/dL Final   TIBC 11/91/4782 272  250 - 450 ug/dL Final   Saturation Ratios 05/08/2022 19  17.9 - 39.5 % Final   UIBC 05/08/2022 220  ug/dL Final   Performed at [facility] Lab, [address] [place], [place] [address]   Sodium 05/08/2022 134 (L)  135 - 145 mmol/L Final   Potassium 05/08/2022 4.0  3.5 - 5.1 mmol/L Final   Chloride 05/08/2022 101  98 - 111 mmol/L Final   CO2 05/08/2022 23  22 - 32 mmol/L Final   Glucose, Bld 05/08/2022 151 (H)  70 - 99 mg/dL Final   Glucose reference range applies only to samples taken after fasting for at least 8 hours.   BUN 05/08/2022 73 (H)  6 - 20 mg/dL Final   Creatinine, Ser 05/08/2022 3.44 (H)  0.61 - 1.24 mg/dL Final   Calcium 30/86/5784 8.8 (L)  8.9 - 10.3 mg/dL Final   Phosphorus 69/62/9528 4.3  2.5 - 4.6 mg/dL Final   Albumin 41/32/4401 3.2 (L)  3.5 - 5.0 g/dL Final   GFR, Estimated 05/08/2022 21 (L)  >60 mL/min Final   Comment: (NOTE) Calculated using the CKD-EPI Creatinine Equation (2021)    Anion gap 05/08/2022 10  5 - 15 Final   Performed at [facility] Lab, [address], [place], [place] [address]   PTH 05/08/2022 189 (H)  15 - 65 pg/mL Final   Calcium, Total (PTH) 05/08/2022 8.8  8.7 - 10.2 mg/dL Final   PTH Interp 36/64/4034 Comment   Final   Comment: (NOTE) Interpretation                 Intact PTH    Calcium                                (pg/mL)      (mg/dL) Normal  15 - 65     8.6 - 10.2 Primary Hyperparathyroidism         >65          >10.2 Secondary Hyperparathyroidism       >65          <10.2 Non-Parathyroid Hypercalcemia       <65          >10.2 Hypoparathyroidism                  <15          < 8.6 Non-Parathyroid Hypocalcemia    15 - 65          <  8.6 Performed At: [facility] [place] [address] [place], [place] [id number] Jolene Schimke MD YQ:[id number]    Hemoglobin 05/08/2022 10.2 (L)  13.0 - 17.0 g/dL Final    Physical Examination:  BP 124/78   Pulse 71   Ht 6\' 2"  (1.88 m)   Wt (!) 328 lb (148.8 kg) Comment: pt reported  SpO2 98%   BMI 42.11 kg/m       ASSESSMENT:  Diabetes type 2, with morbid obesity  See history of present illness for detailed discussion of current diabetes management, blood sugar patterns and problems identified  His last A1c is falsely low at 5.2 again  Fructosamine is just above normal range However his recent GMI is 6% and his sensor is relatively accurate  Is on basal bolus insulin, Mounjaro 12.5 mg weekly  He has been able to benefit from higher doses of Mounjaro but increase weight loss although overall control recently is about the same and he has not reduced his insulin  His day-to-day management routine, insulin adjustment, adjustment of doses, recent blood sugar patterns and prevention of hypoglycemia discussed  Lipids: Well-controlled with lovastatin prescribed by PCP  PLAN:    Likely needs only 15 units of insulin to cover his dinner meal and with low carb meals possibly last Also if he is having low readings before lunch or dinner consistently may reduce Tresiba further Check blood sugar consistently at bedtime Make sure he takes NovoLog before starting to eat lunch and dinner Discussed prevention of hypoglycemia when he is exercising  Follow-up in 3-4 months   There are no Patient Instructions on file for this visit.   Total visit time including counseling = 30 minutes    Reather Littler 05/11/2022, 2:32 PM   Note: This office note was prepared with Dragon voice recognition system technology. Any transcriptional errors that result from this process are unintentional.

## 2022-05-15 ENCOUNTER — Encounter: Payer: Self-pay | Admitting: Endocrinology

## 2022-05-16 ENCOUNTER — Telehealth: Payer: Self-pay

## 2022-05-16 ENCOUNTER — Other Ambulatory Visit (HOSPITAL_COMMUNITY): Payer: Self-pay

## 2022-05-16 ENCOUNTER — Encounter (HOSPITAL_COMMUNITY): Payer: Self-pay

## 2022-05-16 NOTE — Telephone Encounter (Signed)
Patient Advocate Encounter   Received notification from [facility] that prior authorization is required for [facility] 12.5MG /0.5ML pen-injectors  Submitted: 05/16/22 Key [id number]  Status pending

## 2022-05-16 NOTE — Telephone Encounter (Signed)
Pharmacy Patient Advocate Encounter  Prior Authorization has been APPROVED Coverage ends: 01/09/23

## 2022-05-17 NOTE — Telephone Encounter (Signed)
LDTM with information

## 2022-05-19 ENCOUNTER — Encounter (HOSPITAL_COMMUNITY): Payer: Self-pay

## 2022-05-29 ENCOUNTER — Ambulatory Visit (HOSPITAL_COMMUNITY)
Admission: RE | Admit: 2022-05-29 | Discharge: 2022-05-29 | Disposition: A | Discharge status: Home / Self Care | Payer: Medicare HMO | Source: Ambulatory Visit | Attending: Nephrology | Admitting: Nephrology

## 2022-05-29 VITALS — BP 128/70 | HR 82 | Temp 97.7°F | Resp 18

## 2022-05-29 DIAGNOSIS — N183 Chronic kidney disease, stage 3 unspecified: Secondary | ICD-10-CM | POA: Diagnosis not present

## 2022-05-29 LAB — IRON AND TIBC
Iron: 48 ug/dL (ref 45–182)
Saturation Ratios: 17 % — ABNORMAL LOW (ref 17.9–39.5)
TIBC: 284 ug/dL (ref 250–450)
UIBC: 236 ug/dL

## 2022-05-29 LAB — RENAL FUNCTION PANEL
Albumin: 3.2 g/dL — ABNORMAL LOW (ref 3.5–5.0)
Anion gap: 11 (ref 5–15)
BUN: 79 mg/dL — ABNORMAL HIGH (ref 6–20)
CO2: 24 mmol/L (ref 22–32)
Calcium: 8.8 mg/dL — ABNORMAL LOW (ref 8.9–10.3)
Chloride: 103 mmol/L (ref 98–111)
Creatinine, Ser: 3.34 mg/dL — ABNORMAL HIGH (ref 0.61–1.24)
GFR, Estimated: 22 mL/min — ABNORMAL LOW (ref >60)
Glucose, Bld: 150 mg/dL — ABNORMAL HIGH (ref 70–99)
Phosphorus: 4.5 mg/dL (ref 2.5–4.6)
Potassium: 3.5 mmol/L (ref 3.5–5.1)
Sodium: 138 mmol/L (ref 135–145)

## 2022-05-29 LAB — FERRITIN: Ferritin: 274 ng/mL (ref 24–336)

## 2022-05-29 LAB — POCT HEMOGLOBIN-HEMACUE: Hemoglobin: 10.8 g/dL — ABNORMAL LOW (ref 13.0–17.0)

## 2022-05-29 MED ORDER — DARBEPOETIN ALFA 200 MCG/0.4ML IJ SOSY
200.0000 ug | PREFILLED_SYRINGE | INTRAMUSCULAR | Status: DC
  Administered 2022-05-29: 200 ug via SUBCUTANEOUS

## 2022-05-29 MED ORDER — DARBEPOETIN ALFA 200 MCG/0.4ML IJ SOSY
PREFILLED_SYRINGE | INTRAMUSCULAR | Status: AC
  Filled 2022-05-29: qty 0.4

## 2022-06-02 DIAGNOSIS — E119 Type 2 diabetes mellitus without complications: Secondary | ICD-10-CM | POA: Diagnosis not present

## 2022-06-06 ENCOUNTER — Telehealth: Payer: Self-pay | Admitting: *Deleted

## 2022-06-06 ENCOUNTER — Encounter: Payer: Self-pay | Admitting: Student

## 2022-06-06 ENCOUNTER — Other Ambulatory Visit: Payer: Self-pay

## 2022-06-06 ENCOUNTER — Ambulatory Visit (INDEPENDENT_AMBULATORY_CARE_PROVIDER_SITE_OTHER): Payer: Medicare HMO | Admitting: Student

## 2022-06-06 VITALS — BP 138/76 | HR 97 | Temp 98.1°F | Ht 74.0 in

## 2022-06-06 DIAGNOSIS — S46912A Strain of unspecified muscle, fascia and tendon at shoulder and upper arm level, left arm, initial encounter: Secondary | ICD-10-CM

## 2022-06-06 DIAGNOSIS — S56912A Strain of unspecified muscles, fascia and tendons at forearm level, left arm, initial encounter: Secondary | ICD-10-CM | POA: Insufficient documentation

## 2022-06-06 NOTE — Assessment & Plan Note (Addendum)
Patient was seen 1 month ago for medial epicondylitis and olecranon bursitis from overexerting.  He said that his elbow pain has improved with conservative management.  However he has an acute left elbow pain after wrestling with his cousin 2 days ago.  He denies any trauma or dislocation.  He experienced pain a few hours after his physical activity.  He has been using compression, ice and topical Voltaren with some relief.  He is also using massage gun and Tens muscle stimulator.  He states his pain is limiting his range of motion.  He denies any fever or chills.  He has moderate joint effusion noted of left elbow.  The joint is warm to touch but no erythema noted.  He has tenderness to palpation diffusely but worse on the lateral side.  No obvious olecranon bursitis palpated.  Limited range of motion due to pain.  Normal range of motion of left wrist and hand.  Patient likely has left elbow strain from strenuous physical activity. No supporting evidence of bone fracture. He has a joint effusion from inflammation and less likely septic due to his well-appearing and the time course.  Advised patient to continue conservative management with compression, rest, ice and topical NSAIDs.  Avoid oral NSAIDs due to CKD 3B.  He can also take Tylenol as needed for pain.  Avoid strenuous exercise or heavy lifting for the next week.   Patient will return in 1 week for reevaluation.  He will notify if he develops fever, chills, worsening pain or effusion, redness or change in strength or sensation of his left hand.

## 2022-06-06 NOTE — Progress Notes (Addendum)
CC: left elbow pain  HPI:  [name] is a 47 y.o. living with hypertension, type 2 diabetes, HFpEF, CKD 3B, who presents to the clinic for 2 days of left elbow pain.  Please see problem based charting for detail  Past Medical History:  Diagnosis Date   Acute venous embolism and thrombosis of deep vessels of proximal lower extremity (HCC) 07/19/2011   Anemia    Anginal pain (HCC)    pt denies   Anxiety    Chest pain, neg MI, normal coronaries by cath 02/18/2013   pt denies   CHF (congestive heart failure) (HCC)    Chronic renal disease, stage IV (HCC) 02/19/2013   Colon polyps    Depression    Diabetic ulcer of right foot (HCC)    DVT (deep venous thrombosis) (HCC) 09/2002   patient reports additional DVTs in '06 & '11 (unconfirmed)   ED (erectile dysfunction)    GERD (gastroesophageal reflux disease)    History of blood transfusion    "related to OR" (10/31/2016)   Hyperlipidemia 02/19/2013   Hypertension    Nausea & vomiting    "constant for the last couple weeks" (10/31/2016)   Nephrotic syndrome    Obesity    BMI 44, weight 346 pounds 01/30/14   OSA (obstructive sleep apnea) 02/28/2018   Mild obstructive sleep apnea with an AHI of 9.8/h but severe during REM sleep with an AHI of 33.8/h.  Oxygen saturations dropped to 86% and there was moderate snoring   Peripheral edema    Pneumonia    Prosthesis adjustment 08/17/2016   Pulmonary embolism (HCC) 09/2002   treated with 6 months of warfarin   Pyelonephritis 02/02/2018   Type I diabetes mellitus (HCC) dx'd 2001   Urinary retention    Review of Systems:  per HPI  Physical Exam:  Vitals:   06/06/22 1335  BP: 138/76  Pulse: 97  Temp: 98.1 F (36.7 C)  TempSrc: Oral  SpO2: 95%  Height: 6\' 2"  (1.88 m)   Physical Exam Constitutional:      General: He is not in acute distress.    Appearance: He is not ill-appearing.  HENT:     Head: Normocephalic.  Pulmonary:     Effort: Pulmonary effort is normal.  No respiratory distress.  Musculoskeletal:     Cervical back: Normal range of motion.     Comments: Moderate joint effusion noted of left elbow.  The joint is warm to touch but no erythema noted.  He has tenderness to palpation diffusely but worse on the lateral side.  No obvious olecranon bursitis palpated.  Limited range of motion due to pain.  Normal range of motion of left wrist and hand.  Skin:    General: Skin is warm.  Neurological:     Mental Status: He is alert. Mental status is at baseline.  Psychiatric:        Mood and Affect: Mood normal.      Assessment & Plan:   See Encounters Tab for problem based charting.  Elbow strain, left, initial encounter Patient was seen 1 month ago for medial epicondylitis and olecranon bursitis from overexerting.  He said that his elbow pain has improved with conservative management.  However he has an acute left elbow pain after wrestling with his cousin 2 days ago.  He denies any trauma or dislocation.  He experienced pain a few hours after his physical activity.  He has been using compression, ice and topical Voltaren with some  relief.  He is also using massage gun and Tens muscle stimulator.  He states his pain is limiting his range of motion.  He denies any fever or chills.  He has moderate joint effusion noted of left elbow.  The joint is warm to touch but no erythema noted.  He has tenderness to palpation diffusely but worse on the lateral side.  No obvious olecranon bursitis palpated.  Limited range of motion due to pain.  Normal range of motion of left wrist and hand.  Patient likely has left elbow strain from strenuous physical activity. No supporting evidence of bone fracture. He has a joint effusion from inflammation and less likely septic due to his well-appearing and the time course.  Advised patient to continue conservative management with compression, rest, ice and topical NSAIDs.  Avoid oral NSAIDs due to CKD 3B.  He can also take  Tylenol as needed for pain.  Avoid strenuous exercise or heavy lifting for the next week.   Patient will return in 1 week for reevaluation.  He will notify if he develops fever, chills, worsening pain or effusion, redness or change in strength or sensation of his left hand.   Patient discussed with Dr.  Sol Blazing

## 2022-06-06 NOTE — Telephone Encounter (Signed)
Dr Cyndie Chime agreed to see pt this afternoon @ 1345 PM.

## 2022-06-06 NOTE — Telephone Encounter (Signed)
Walk-in for c/o left elbow pain. States it's aching and throbbing. States he was seen on 4/25 for the same problem but now the pain is worse since Sunday. He was prescribed a muscle relaxant but states it does not help. He states he cannot bend his arm and cannot raise it up. No available appts today; informed appts are available tomorrow- pt is requesting something stronger for the pain until he can schedule an appt.

## 2022-06-06 NOTE — Patient Instructions (Signed)
[name],  It was nice seeing you in the clinic today.  I am sorry that your left elbow is bothering you.  There is a lot of inflammation in your joint due to trauma and overuse.  Please continue wearing your compression to reduce joint swelling.  Please use ice and topical Voltaren gel to help with pain.  You can also take Tylenol as needed.  Please avoid strenuous exercise or heavy lifting with your left elbow for 1 week.  Things to look for include fever, chills, worsening swelling or pain, redness, losing sensation of your fingers.  Please notify us if you have any of the symptoms.  Please return in 1 week for follow-up  Dr. Cyndie Chime

## 2022-06-07 NOTE — Addendum Note (Signed)
Addended by: Dickie La on: 06/07/2022 10:33 AM   Modules accepted: Level of Service

## 2022-06-07 NOTE — Progress Notes (Signed)
Internal Medicine Clinic Attending  I saw and evaluated the patient.  I personally confirmed the key portions of the history and exam documented by Dr. Nguyen and I reviewed pertinent patient test results.  The assessment, diagnosis, and plan were formulated together and I agree with the documentation in the resident's note.\  

## 2022-06-08 ENCOUNTER — Other Ambulatory Visit: Payer: Self-pay

## 2022-06-09 ENCOUNTER — Telehealth: Payer: Self-pay | Admitting: Student

## 2022-06-09 NOTE — Telephone Encounter (Signed)
I called patient to check on his left elbow.  Patient states that his left elbow swelling has improved with compression and ice.  He has much better range of motion and is now able to raise arm above his head.  He denies any erythema, fever or chills.  Overall he is happy with the progress.  He will follow-up with Dr. Cliffton Asters on 6/5 for follow-up.

## 2022-06-10 ENCOUNTER — Other Ambulatory Visit: Payer: Self-pay

## 2022-06-14 ENCOUNTER — Other Ambulatory Visit: Payer: Self-pay

## 2022-06-14 ENCOUNTER — Ambulatory Visit (INDEPENDENT_AMBULATORY_CARE_PROVIDER_SITE_OTHER): Payer: Medicare HMO

## 2022-06-14 VITALS — BP 125/70 | HR 90 | Temp 98.4°F | Ht 74.0 in

## 2022-06-14 DIAGNOSIS — Z89611 Acquired absence of right leg above knee: Secondary | ICD-10-CM

## 2022-06-14 DIAGNOSIS — E119 Type 2 diabetes mellitus without complications: Secondary | ICD-10-CM | POA: Diagnosis not present

## 2022-06-14 DIAGNOSIS — S46912A Strain of unspecified muscle, fascia and tendon at shoulder and upper arm level, left arm, initial encounter: Secondary | ICD-10-CM | POA: Diagnosis not present

## 2022-06-14 DIAGNOSIS — Z794 Long term (current) use of insulin: Secondary | ICD-10-CM

## 2022-06-14 DIAGNOSIS — S78111A Complete traumatic amputation at level between right hip and knee, initial encounter: Secondary | ICD-10-CM

## 2022-06-14 NOTE — Patient Instructions (Addendum)
[name], it was a pleasure seeing you today!  Today we discussed: Referral sent for sleeve. Let us know if you have any recurrence of your left elbow pain.  Glad things have resolved.  I have ordered the following labs today:   Lab Orders         POC Hbg A1C      Tests ordered today:    Referrals ordered today:    Referral Orders         Ambulatory Referral for DME      I have ordered the following medication/changed the following medications:   Stop the following medications: There are no discontinued medications.   Start the following medications: No orders of the defined types were placed in this encounter.    Follow-up:    Please make sure to arrive 15 minutes prior to your next appointment. If you arrive late, you may be asked to reschedule.   We look forward to seeing you next time. Please call our clinic at [phone] if you have any questions or concerns. The best time to call is Monday-Friday from 9am-4pm, but there is someone available 24/7. If after hours or the weekend, call the main hospital number and ask for the Internal Medicine Resident On-Call. If you need medication refills, please notify your pharmacy one week in advance and they will send [place] a request.  Thank you for letting us take part in your care. Wishing you the best!  Thank you, Evlyn Kanner, MD

## 2022-06-14 NOTE — Progress Notes (Signed)
   CC: left elbow pain  HPI:  [name] is a 47 y.o. with past medical history as below who presents for follow up of left elbow pain. See detailed assessment and plan for HPI.  Past Medical History:  Diagnosis Date   Acute venous embolism and thrombosis of deep vessels of proximal lower extremity (HCC) 07/19/2011   Anemia    Anginal pain (HCC)    pt denies   Anxiety    Chest pain, neg MI, normal coronaries by cath 02/18/2013   pt denies   CHF (congestive heart failure) (HCC)    Chronic renal disease, stage IV (HCC) 02/19/2013   Colon polyps    Depression    Diabetic ulcer of right foot (HCC)    DVT (deep venous thrombosis) (HCC) 09/2002   patient reports additional DVTs in '06 & '11 (unconfirmed)   ED (erectile dysfunction)    GERD (gastroesophageal reflux disease)    History of blood transfusion    "related to OR" (10/31/2016)   Hyperlipidemia 02/19/2013   Hypertension    Nausea & vomiting    "constant for the last couple weeks" (10/31/2016)   Nephrotic syndrome    Obesity    BMI 44, weight 346 pounds 01/30/14   OSA (obstructive sleep apnea) 02/28/2018   Mild obstructive sleep apnea with an AHI of 9.8/h but severe during REM sleep with an AHI of 33.8/h.  Oxygen saturations dropped to 86% and there was moderate snoring   Peripheral edema    Pneumonia    Prosthesis adjustment 08/17/2016   Pulmonary embolism (HCC) 09/2002   treated with 6 months of warfarin   Pyelonephritis 02/02/2018   Type I diabetes mellitus (HCC) dx'd 2001   Urinary retention    Review of Systems:  See detailed assessment and plan for pertinent ROS.  Physical Exam:  Vitals:   06/14/22 0910  BP: 125/70  Pulse: 90  Temp: 98.4 F (36.9 C)  TempSrc: Oral  SpO2: 98%  Height: 6\' 2"  (1.88 m)   Physical Exam Constitutional:      General: He is not in acute distress.    Comments: Right AKA.  In wheelchair.  HENT:     Head: Normocephalic and atraumatic.  Eyes:     Extraocular  Movements: Extraocular movements intact.  Pulmonary:     Effort: Pulmonary effort is normal.  Musculoskeletal:     Cervical back: Neck supple.     Comments: Left elbow without effusion, warmth, or erythema.  Full range of motion.  No tenderness with palpation.  Skin:    General: Skin is warm and dry.  Neurological:     Mental Status: He is alert and oriented to person, place, and time.  Psychiatric:        Mood and Affect: Mood normal.        Behavior: Behavior normal.      Assessment & Plan:   See Encounters Tab for problem based charting.  Elbow strain, left, initial encounter Patient presents for follow-up of his left elbow pain thought to be related to injury while wrestling his nephew.  He states that his pain has resolved and he has started working out again without any trouble.  Exam demonstrates resolution of warmth and effusion.  Full range of motion.  Unilateral AKA, right [facility]) Patient requests new sleeve for his right distal extremity. -Referral for DME request  Patient discussed with Dr. Oswaldo Done

## 2022-06-14 NOTE — Assessment & Plan Note (Signed)
Patient requests new sleeve for his right distal extremity. -Referral for DME request

## 2022-06-14 NOTE — Assessment & Plan Note (Signed)
Patient presents for follow-up of his left elbow pain thought to be related to injury while wrestling his nephew.  He states that his pain has resolved and he has started working out again without any trouble.  Exam demonstrates resolution of warmth and effusion.  Full range of motion.

## 2022-06-15 NOTE — Progress Notes (Signed)
[facility] Attending  Case discussed with Dr. White  At the time of the visit.  We reviewed the resident's history and exam and pertinent patient test results.  I agree with the assessment, diagnosis, and plan of care documented in the resident's note.  

## 2022-06-19 ENCOUNTER — Encounter (HOSPITAL_COMMUNITY): Payer: Medicare HMO

## 2022-06-22 ENCOUNTER — Ambulatory Visit (HOSPITAL_COMMUNITY)
Admission: RE | Admit: 2022-06-22 | Discharge: 2022-06-22 | Disposition: A | Discharge status: Home / Self Care | Payer: Medicare HMO | Source: Ambulatory Visit | Attending: Nephrology | Admitting: Nephrology

## 2022-06-22 VITALS — BP 140/69 | HR 87 | Temp 97.9°F | Resp 17

## 2022-06-22 DIAGNOSIS — N183 Chronic kidney disease, stage 3 unspecified: Secondary | ICD-10-CM | POA: Diagnosis not present

## 2022-06-22 LAB — RENAL FUNCTION PANEL
Albumin: 3.2 g/dL — ABNORMAL LOW (ref 3.5–5.0)
Anion gap: 13 (ref 5–15)
BUN: 68 mg/dL — ABNORMAL HIGH (ref 6–20)
CO2: 23 mmol/L (ref 22–32)
Calcium: 8.5 mg/dL — ABNORMAL LOW (ref 8.9–10.3)
Chloride: 101 mmol/L (ref 98–111)
Creatinine, Ser: 3.3 mg/dL — ABNORMAL HIGH (ref 0.61–1.24)
GFR, Estimated: 23 mL/min — ABNORMAL LOW (ref >60)
Glucose, Bld: 103 mg/dL — ABNORMAL HIGH (ref 70–99)
Phosphorus: 4.3 mg/dL (ref 2.5–4.6)
Potassium: 3.9 mmol/L (ref 3.5–5.1)
Sodium: 137 mmol/L (ref 135–145)

## 2022-06-22 LAB — POCT HEMOGLOBIN-HEMACUE: Hemoglobin: 10.2 g/dL — ABNORMAL LOW (ref 13.0–17.0)

## 2022-06-22 LAB — IRON AND TIBC
Iron: 64 ug/dL (ref 45–182)
Saturation Ratios: 23 % (ref 17.9–39.5)
TIBC: 280 ug/dL (ref 250–450)
UIBC: 216 ug/dL

## 2022-06-22 LAB — FERRITIN: Ferritin: 381 ng/mL — ABNORMAL HIGH (ref 24–336)

## 2022-06-22 MED ORDER — DARBEPOETIN ALFA 200 MCG/0.4ML IJ SOSY
200.0000 ug | PREFILLED_SYRINGE | INTRAMUSCULAR | Status: DC
  Administered 2022-06-22: 200 ug via SUBCUTANEOUS

## 2022-06-22 MED ORDER — DARBEPOETIN ALFA 200 MCG/0.4ML IJ SOSY
PREFILLED_SYRINGE | INTRAMUSCULAR | Status: AC
  Filled 2022-06-22: qty 0.4

## 2022-06-23 DIAGNOSIS — D631 Anemia in chronic kidney disease: Secondary | ICD-10-CM | POA: Diagnosis not present

## 2022-06-23 DIAGNOSIS — I130 Hypertensive heart and chronic kidney disease with heart failure and stage 1 through stage 4 chronic kidney disease, or unspecified chronic kidney disease: Secondary | ICD-10-CM | POA: Diagnosis not present

## 2022-06-23 DIAGNOSIS — I5032 Chronic diastolic (congestive) heart failure: Secondary | ICD-10-CM | POA: Diagnosis not present

## 2022-06-23 DIAGNOSIS — N2581 Secondary hyperparathyroidism of renal origin: Secondary | ICD-10-CM | POA: Diagnosis not present

## 2022-06-23 DIAGNOSIS — N184 Chronic kidney disease, stage 4 (severe): Secondary | ICD-10-CM | POA: Diagnosis not present

## 2022-06-23 DIAGNOSIS — Z86718 Personal history of other venous thrombosis and embolism: Secondary | ICD-10-CM | POA: Diagnosis not present

## 2022-06-23 DIAGNOSIS — E876 Hypokalemia: Secondary | ICD-10-CM | POA: Diagnosis not present

## 2022-06-23 DIAGNOSIS — E1122 Type 2 diabetes mellitus with diabetic chronic kidney disease: Secondary | ICD-10-CM | POA: Diagnosis not present

## 2022-06-23 DIAGNOSIS — Z794 Long term (current) use of insulin: Secondary | ICD-10-CM | POA: Diagnosis not present

## 2022-07-02 DIAGNOSIS — E119 Type 2 diabetes mellitus without complications: Secondary | ICD-10-CM | POA: Diagnosis not present

## 2022-07-04 ENCOUNTER — Other Ambulatory Visit: Payer: Self-pay

## 2022-07-04 DIAGNOSIS — I5032 Chronic diastolic (congestive) heart failure: Secondary | ICD-10-CM

## 2022-07-05 ENCOUNTER — Other Ambulatory Visit: Payer: Self-pay

## 2022-07-06 ENCOUNTER — Other Ambulatory Visit: Payer: Self-pay | Admitting: Endocrinology

## 2022-07-06 ENCOUNTER — Other Ambulatory Visit: Payer: Self-pay

## 2022-07-06 DIAGNOSIS — E1165 Type 2 diabetes mellitus with hyperglycemia: Secondary | ICD-10-CM

## 2022-07-06 MED ORDER — TIRZEPATIDE 12.5 MG/0.5ML ~~LOC~~ SOAJ
12.5000 mg | SUBCUTANEOUS | 2 refills | Status: DC
Start: 2022-07-06 — End: 2022-07-07

## 2022-07-07 ENCOUNTER — Other Ambulatory Visit: Payer: Self-pay

## 2022-07-07 ENCOUNTER — Other Ambulatory Visit: Payer: Self-pay | Admitting: Endocrinology

## 2022-07-07 DIAGNOSIS — E1165 Type 2 diabetes mellitus with hyperglycemia: Secondary | ICD-10-CM

## 2022-07-07 MED ORDER — NOVOLOG FLEXPEN 100 UNIT/ML ~~LOC~~ SOPN: 15.0000 [IU] | PEN_INJECTOR | Freq: Three times a day (TID) | SUBCUTANEOUS | 2 refills | Status: DC

## 2022-07-14 ENCOUNTER — Encounter (HOSPITAL_COMMUNITY): Payer: Medicare HMO

## 2022-07-17 ENCOUNTER — Encounter (HOSPITAL_COMMUNITY): Payer: Medicare HMO

## 2022-07-18 ENCOUNTER — Encounter (HOSPITAL_COMMUNITY)
Admission: RE | Admit: 2022-07-18 | Discharge: 2022-07-18 | Disposition: A | Discharge status: Home / Self Care | Payer: Medicare HMO | Source: Ambulatory Visit | Attending: Nephrology | Admitting: Nephrology

## 2022-07-18 VITALS — BP 130/57 | HR 99 | Temp 97.5°F | Resp 18

## 2022-07-18 DIAGNOSIS — N183 Chronic kidney disease, stage 3 unspecified: Secondary | ICD-10-CM | POA: Diagnosis not present

## 2022-07-18 LAB — RENAL FUNCTION PANEL
Albumin: 3.4 g/dL — ABNORMAL LOW (ref 3.5–5.0)
Anion gap: 10 (ref 5–15)
BUN: 68 mg/dL — ABNORMAL HIGH (ref 6–20)
CO2: 24 mmol/L (ref 22–32)
Calcium: 8.9 mg/dL (ref 8.9–10.3)
Chloride: 104 mmol/L (ref 98–111)
Creatinine, Ser: 3.46 mg/dL — ABNORMAL HIGH (ref 0.61–1.24)
GFR, Estimated: 21 mL/min — ABNORMAL LOW (ref >60)
Glucose, Bld: 73 mg/dL (ref 70–99)
Phosphorus: 3.3 mg/dL (ref 2.5–4.6)
Potassium: 4 mmol/L (ref 3.5–5.1)
Sodium: 138 mmol/L (ref 135–145)

## 2022-07-18 LAB — IRON AND TIBC
Iron: 64 ug/dL (ref 45–182)
Saturation Ratios: 22 % (ref 17.9–39.5)
TIBC: 293 ug/dL (ref 250–450)
UIBC: 229 ug/dL

## 2022-07-18 LAB — POCT HEMOGLOBIN-HEMACUE: Hemoglobin: 11 g/dL — ABNORMAL LOW (ref 13.0–17.0)

## 2022-07-18 LAB — FERRITIN: Ferritin: 342 ng/mL — ABNORMAL HIGH (ref 24–336)

## 2022-07-18 MED ORDER — DARBEPOETIN ALFA 200 MCG/0.4ML IJ SOSY
PREFILLED_SYRINGE | INTRAMUSCULAR | Status: AC
  Administered 2022-07-18: 200 ug via SUBCUTANEOUS
  Filled 2022-07-18: qty 0.4

## 2022-07-18 MED ORDER — DARBEPOETIN ALFA 200 MCG/0.4ML IJ SOSY: 200.0000 ug | PREFILLED_SYRINGE | INTRAMUSCULAR | Status: DC

## 2022-07-19 DIAGNOSIS — Z89611 Acquired absence of right leg above knee: Secondary | ICD-10-CM | POA: Diagnosis not present

## 2022-08-01 DIAGNOSIS — E119 Type 2 diabetes mellitus without complications: Secondary | ICD-10-CM | POA: Diagnosis not present

## 2022-08-04 ENCOUNTER — Encounter (HOSPITAL_COMMUNITY): Payer: Medicare HMO

## 2022-08-04 ENCOUNTER — Ambulatory Visit: Payer: Medicare HMO | Admitting: Student

## 2022-08-04 VITALS — BP 154/71 | HR 89 | Temp 98.4°F | Ht 74.0 in | Wt 343.5 lb

## 2022-08-04 DIAGNOSIS — E119 Type 2 diabetes mellitus without complications: Secondary | ICD-10-CM

## 2022-08-04 DIAGNOSIS — Z794 Long term (current) use of insulin: Secondary | ICD-10-CM

## 2022-08-04 NOTE — Progress Notes (Signed)
CC: Concerns for the need for a new wheelchair helping participate in upcoming marathon and [place]  HPI:  [name] is a 47 y.o. male living with a history stated below and presents today for concerns for the need for a new wheelchair helping participate in upcoming marathon and [place]. Please see problem based assessment and plan for additional details.  Past Medical History:  Diagnosis Date   Acute venous embolism and thrombosis of deep vessels of proximal lower extremity (HCC) 07/19/2011   Anemia    Anginal pain (HCC)    pt denies   Anxiety    Chest pain, neg MI, normal coronaries by cath 02/18/2013   pt denies   CHF (congestive heart failure) (HCC)    Chronic renal disease, stage IV (HCC) 02/19/2013   Colon polyps    Depression    Diabetic ulcer of right foot (HCC)    DVT (deep venous thrombosis) (HCC) 09/2002   patient reports additional DVTs in '06 & '11 (unconfirmed)   ED (erectile dysfunction)    GERD (gastroesophageal reflux disease)    History of blood transfusion    "related to OR" (10/31/2016)   Hyperlipidemia 02/19/2013   Hypertension    Nausea & vomiting    "constant for the last couple weeks" (10/31/2016)   Nephrotic syndrome    Obesity    BMI 44, weight 346 pounds 01/30/14   OSA (obstructive sleep apnea) 02/28/2018   Mild obstructive sleep apnea with an AHI of 9.8/h but severe during REM sleep with an AHI of 33.8/h.  Oxygen saturations dropped to 86% and there was moderate snoring   Peripheral edema    Pneumonia    Prosthesis adjustment 08/17/2016   Pulmonary embolism (HCC) 09/2002   treated with 6 months of warfarin   Pyelonephritis 02/02/2018   Type I diabetes mellitus (HCC) dx'd 2001   Urinary retention     Current Outpatient Medications on File Prior to Visit  Medication Sig Dispense Refill   acidophilus (RISAQUAD) CAPS capsule Take 1 capsule by mouth daily.     Alpha-Lipoic Acid 600 MG CAPS Take 600 mg by mouth daily.      amLODipine (NORVASC) 10 MG tablet Take 10 mg by mouth at bedtime.     B-D UF III MINI PEN NEEDLES 31G X 5 MM MISC USE 1 NEEDLE FIVE TIMES DAILY 400 each 0   Blood Glucose Monitoring Suppl (ACCU-CHEK GUIDE ME) w/Device KIT Use to check blood sugar up to 3 times a day 1 kit 1   carvedilol (COREG) 25 MG tablet TAKE 1 TABLET BY MOUTH TWICE DAILY WITH A MEAL 180 tablet 1   chlorthalidone (HYGROTON) 25 MG tablet Take 25 mg by mouth daily.     cloNIDine (CATAPRES - DOSED IN MG/24 HR) 0.1 mg/24hr patch 0.1 mg once a week.     Cyanocobalamin (B-12) 500 MCG SUBL Place 500 mcg under the tongue daily.      diclofenac Sodium (VOLTAREN) 1 % GEL Apply 2 g topically 4 (four) times daily. 100 g 3   ELIQUIS 5 MG TABS tablet Take 1 tablet by mouth twice daily 180 tablet 0   famotidine (PEPCID) 20 MG tablet Take 1 tablet (20 mg total) by mouth 2 (two) times daily. (Patient taking differently: Take 20 mg by mouth 2 (two) times daily as needed for heartburn.) 30 tablet 0   fluticasone (FLONASE) 50 MCG/ACT nasal spray Place 2 sprays into both nostrils daily. (Patient taking differently: Place 2 sprays  into both nostrils daily as needed for allergies.) 15.8 mL 2   furosemide (LASIX) 80 MG tablet Take 1 tablet by mouth twice daily (Patient taking differently: Take 80 mg by mouth 2 (two) times daily.) 180 tablet 0   gabapentin (NEURONTIN) 100 MG capsule Take 1 capsule (100 mg total) by mouth 3 (three) times daily. 270 capsule 3   glucose blood (ACCU-CHEK GUIDE) test strip Check blood sugar up to 3 times a day 300 each 3   hydrALAZINE (APRESOLINE) 100 MG tablet Take 100 mg by mouth 3 (three) times daily.     insulin degludec (TRESIBA FLEXTOUCH) 200 UNIT/ML FlexTouch Pen INJECT 48 UNITS SUBCUTANEOUSLY ONCE DAILY 45 mL 0   Insulin Pen Needle (B-D UF III MINI PEN NEEDLES) 31G X 5 MM MISC 1 each by Other route See admin instructions. USE 4-5 TIMES DAILY 200 each 0   Lancet Devices (ACCU-CHEK SOFTCLIX) lancets Use to check blood  sugars 5 times a day. Dx code:E11.9. Insulin dependent. (Patient taking differently: 1 each by Other route See admin instructions. Use to check blood sugars 5 times a day. Dx code:E11.9. Insulin dependent.) 6 each 3   lidocaine (HM LIDOCAINE PATCH) 4 % Place 1 patch onto the skin daily. 5 patch 0   lovastatin (MEVACOR) 20 MG tablet Take 1 tablet (20 mg total) by mouth daily. 90 tablet 3   methocarbamol (ROBAXIN) 500 MG tablet Take 1 tablet (500 mg total) by mouth at bedtime as needed for muscle spasms. 15 tablet 0   MOUNJARO 12.5 MG/0.5ML Pen INJECT 12.5 MG SUBCUTANEOUSLY ONCE A WEEK 4 mL 0   NOVOLOG FLEXPEN 100 UNIT/ML FlexPen Inject 15-30 Units into the skin 3 (three) times daily with meals. Administer 15 units in the morning with breakfast, 25 units with lunch, and 30 units with dinner daily.  ICD Code:  E11.9 15 mL 2   oxybutynin (DITROPAN) 5 MG tablet TAKE 1 TABLET BY MOUTH THREE TIMES DAILY 90 tablet 3   polyethylene glycol (MIRALAX / GLYCOLAX) 17 g packet Take 17 g by mouth 2 (two) times daily. (Patient taking differently: Take 17 g by mouth as needed for mild constipation.) 14 each 0   potassium chloride SA (KLOR-CON) 20 MEQ tablet Take 20 mEq by mouth 2 (two) times daily.     senna-docusate (SENOKOT-S) 8.6-50 MG tablet Take 1 tablet by mouth daily. (Patient taking differently: Take 1 tablet by mouth as needed for mild constipation.) 90 tablet 3   sildenafil (VIAGRA) 50 MG tablet Take 1-2 tablets (50-100 mg total) by mouth as needed for erectile dysfunction. Take 1 hour before needed. Do not take more than 2 tablets at once. 20 tablet 0   tamsulosin (FLOMAX) 0.4 MG CAPS capsule Take 1 capsule (0.4 mg total) by mouth at bedtime. 90 capsule 3   No current facility-administered medications on file prior to visit.    Family History  Problem Relation Age of Onset   Heart disease Mother    Diabetes Mother    Diabetes Maternal Uncle    Diabetes Cousin     Social History   Socioeconomic  History   Marital status: Single    Spouse name: Not on file   Number of children: 0   Years of education: 15.5   Highest education level: Some college, no degree  Occupational History   Occupation: student    Comment: [employer]   Occupation: disability  Tobacco Use   Smoking status: Never   Smokeless tobacco: Never  Vaping  Use   Vaping status: Never Used  Substance and Sexual Activity   Alcohol use: Yes    Comment: 10/31/2016 "a few beers q 6 months or so"   Drug use: No   Sexual activity: Yes  Other Topics Concern   Not on file  Social History Narrative   Financial assistance approved for 100% discount at [facility] and has [facility] card per Rudell Cobb   09/01/2009.      Lives in [place] with mother and sister.            Social Determinants of Health   Financial Resource Strain: Medium Risk (10/01/2020)   Overall Financial Resource Strain (CARDIA)    Difficulty of Paying Living Expenses: Somewhat hard  Food Insecurity: No Food Insecurity (01/24/2022)   Hunger Vital Sign    Worried About Running Out of Food in the Last Year: Never true    Ran Out of Food in the Last Year: Never true  Transportation Needs: No Transportation Needs (01/24/2022)   PRAPARE - Administrator, Civil Service (Medical): No    Lack of Transportation (Non-Medical): No  Physical Activity: Sufficiently Active (08/20/2019)   Exercise Vital Sign    Days of Exercise per Week: 6 days    Minutes of Exercise per Session: 60 min  Stress: No Stress Concern Present (08/20/2019)   [employer] of Occupational Health - Occupational Stress Questionnaire    Feeling of Stress : Not at all  Social Connections: Socially Isolated (12/22/2021)   Social Connection and Isolation Panel [NHANES]    Frequency of Communication with Friends and Family: More than three times a week    Frequency of Social Gatherings with Friends and Family: More than three times a week    Attends Religious Services: Never    Automotive engineer or Organizations: No    Attends Banker Meetings: Never    Marital Status: Never married  Intimate Partner Violence: Not At Risk (12/22/2021)   Humiliation, Afraid, Rape, and Kick questionnaire    Fear of Current or Ex-Partner: No    Emotionally Abused: No    Physically Abused: No    Sexually Abused: No    Review of Systems: ROS negative except for what is noted on the assessment and plan.  Vitals:   08/04/22 0935  BP: (!) 154/71  Pulse: 89  Temp: 98.4 F (36.9 C)  TempSrc: Oral  SpO2: 97%  Weight: (!) 343 lb 8 oz (155.8 kg)  Height: 6\' 2"  (1.88 m)    Physical Exam: Constitutional: This appearing man sitting in a wheelchair with a left BKA  Rest of the exam deferred because patient had to leave in the middle of his clinic visit, says his ride is here   Assessment & Plan:   47 year old male history of morbid obesity type 2 diabetes hypertension chronic kidney disease stage III presented to the office due to concerns for the need to get a new wheelchair for an upcoming marathon that he is part of in September.  Of notes patient has requested this type of wheelchair in the past but has been denied by his insurance.  Currently has a wheelchair due to his left BKA but says it is not good enough for his Olympic activities.  Requesting new wheelchair with good traction that can help him in the upcoming race.  On exam he is an obese appearing man sitting in the wheelchair with left BKA and prosthetic in place, no abnormal  abnormal heart sounds was appreciated on my exam.  Blood pressure today was 154/71 however did not get a chance to repeat because patient said he had to leave because his ride is here.  No other chronic illnesses was discussed because patient decided to leave in the middle of the visit.  Patient has been advised to contact the office if he has any other concerns   Patient discussed with Dr. Joycelyn Das, M.D [facility]  Internal Medicine Phone: [phone] Date 08/04/2022 Time 9:51 AM

## 2022-08-04 NOTE — Patient Instructions (Signed)
Thank you, Mr.Geroge Aquilar for allowing [place] to provide your care today. Today we discussed your genera lhealth.    I have ordered the following labs for you:   Lab Orders         POC Hbg A1C      Tests ordered today:    Referrals ordered today:   Referral Orders  No referral(s) requested today     I have ordered the following medication/changed the following medications:   Stop the following medications: There are no discontinued medications.   Start the following medications: No orders of the defined types were placed in this encounter.    Follow up: 1 year   Remember:   Should you have any questions or concerns please call the internal medicine clinic at [phone].    Kathleen Lime, M.D [facility]

## 2022-08-07 NOTE — Progress Notes (Signed)
Internal Medicine Attending:  Discussed with the resident and agree with the resident's findings and plan of care as documented in the resident's note.  Tyson Alias, MD

## 2022-08-08 ENCOUNTER — Encounter (HOSPITAL_COMMUNITY): Payer: Medicare HMO

## 2022-08-09 ENCOUNTER — Encounter (HOSPITAL_COMMUNITY)
Admission: RE | Admit: 2022-08-09 | Discharge: 2022-08-09 | Disposition: A | Discharge status: Home / Self Care | Payer: Medicare HMO | Source: Ambulatory Visit | Attending: Nephrology | Admitting: Nephrology

## 2022-08-09 VITALS — BP 137/69 | HR 81 | Temp 97.6°F | Resp 18

## 2022-08-09 DIAGNOSIS — N183 Chronic kidney disease, stage 3 unspecified: Secondary | ICD-10-CM

## 2022-08-09 LAB — RENAL FUNCTION PANEL
Albumin: 3.6 g/dL (ref 3.5–5.0)
Anion gap: 15 (ref 5–15)
BUN: 106 mg/dL — ABNORMAL HIGH (ref 6–20)
CO2: 22 mmol/L (ref 22–32)
Calcium: 9.1 mg/dL (ref 8.9–10.3)
Chloride: 96 mmol/L — ABNORMAL LOW (ref 98–111)
Creatinine, Ser: 4.37 mg/dL — ABNORMAL HIGH (ref 0.61–1.24)
GFR, Estimated: 16 mL/min — ABNORMAL LOW (ref >60)
Glucose, Bld: 91 mg/dL (ref 70–99)
Phosphorus: 4.4 mg/dL (ref 2.5–4.6)
Potassium: 3.4 mmol/L — ABNORMAL LOW (ref 3.5–5.1)
Sodium: 133 mmol/L — ABNORMAL LOW (ref 135–145)

## 2022-08-09 LAB — IRON AND TIBC
Iron: 68 ug/dL (ref 45–182)
Saturation Ratios: 23 % (ref 17.9–39.5)
TIBC: 297 ug/dL (ref 250–450)
UIBC: 229 ug/dL

## 2022-08-09 LAB — FERRITIN: Ferritin: 324 ng/mL (ref 24–336)

## 2022-08-09 LAB — POCT HEMOGLOBIN-HEMACUE: Hemoglobin: 11.4 g/dL — ABNORMAL LOW (ref 13.0–17.0)

## 2022-08-09 MED ORDER — DARBEPOETIN ALFA 200 MCG/0.4ML IJ SOSY
200.0000 ug | PREFILLED_SYRINGE | INTRAMUSCULAR | Status: DC
  Administered 2022-08-09: 200 ug via SUBCUTANEOUS

## 2022-08-09 MED ORDER — DARBEPOETIN ALFA 200 MCG/0.4ML IJ SOSY
PREFILLED_SYRINGE | INTRAMUSCULAR | Status: AC
  Filled 2022-08-09: qty 0.4

## 2022-08-10 DIAGNOSIS — I130 Hypertensive heart and chronic kidney disease with heart failure and stage 1 through stage 4 chronic kidney disease, or unspecified chronic kidney disease: Secondary | ICD-10-CM | POA: Diagnosis not present

## 2022-08-10 DIAGNOSIS — E876 Hypokalemia: Secondary | ICD-10-CM | POA: Diagnosis not present

## 2022-08-10 DIAGNOSIS — Z794 Long term (current) use of insulin: Secondary | ICD-10-CM | POA: Diagnosis not present

## 2022-08-10 DIAGNOSIS — I5032 Chronic diastolic (congestive) heart failure: Secondary | ICD-10-CM | POA: Diagnosis not present

## 2022-08-10 DIAGNOSIS — E1122 Type 2 diabetes mellitus with diabetic chronic kidney disease: Secondary | ICD-10-CM | POA: Diagnosis not present

## 2022-08-10 DIAGNOSIS — N2581 Secondary hyperparathyroidism of renal origin: Secondary | ICD-10-CM | POA: Diagnosis not present

## 2022-08-10 DIAGNOSIS — Z86718 Personal history of other venous thrombosis and embolism: Secondary | ICD-10-CM | POA: Diagnosis not present

## 2022-08-10 DIAGNOSIS — N184 Chronic kidney disease, stage 4 (severe): Secondary | ICD-10-CM | POA: Diagnosis not present

## 2022-08-10 DIAGNOSIS — D631 Anemia in chronic kidney disease: Secondary | ICD-10-CM | POA: Diagnosis not present

## 2022-08-28 ENCOUNTER — Other Ambulatory Visit (INDEPENDENT_AMBULATORY_CARE_PROVIDER_SITE_OTHER): Payer: Medicare HMO

## 2022-08-28 ENCOUNTER — Telehealth: Payer: Self-pay | Admitting: Student

## 2022-08-28 DIAGNOSIS — Z794 Long term (current) use of insulin: Secondary | ICD-10-CM | POA: Diagnosis not present

## 2022-08-28 DIAGNOSIS — E119 Type 2 diabetes mellitus without complications: Secondary | ICD-10-CM

## 2022-08-28 DIAGNOSIS — S78111A Complete traumatic amputation at level between right hip and knee, initial encounter: Secondary | ICD-10-CM

## 2022-08-28 LAB — HEMOGLOBIN A1C: Hgb A1c MFr Bld: 5 % (ref 4.6–6.5)

## 2022-08-28 LAB — GLUCOSE, RANDOM: Glucose, Bld: 108 mg/dL — ABNORMAL HIGH (ref 70–99)

## 2022-08-28 NOTE — Telephone Encounter (Signed)
Pt states he was seen on 08/04/2022 and states a Wheelchair DME Referral was to have been placed.  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  Please advise if you will be able to go ahead and place the DME Referral.

## 2022-08-28 NOTE — Telephone Encounter (Signed)
I believe we did address this with the patient. Our view was that we were very supportive of his desire for a exercise-style wheelchair to use for this event, we did not think it met medical necessity to be covered by insurance. Rather, my assumption was that a supplemental wheel chair in the style he was requesting needed to be paid for out of pocket.

## 2022-08-29 ENCOUNTER — Encounter (HOSPITAL_COMMUNITY): Payer: Medicare HMO

## 2022-08-29 LAB — FRUCTOSAMINE: Fructosamine: 271 umol/L (ref 0–285)

## 2022-08-29 NOTE — Telephone Encounter (Addendum)
Ok. I spoke with [name]. I think I understand his needs better. He has been speaking to [employer], they are the ones requesting an order for a manual wheelchair. I set expectations that it is unclear to me if his insurance will cover an exercise wheelchair. But they are going to work that process out, and see about the pricing. I have placed the order.

## 2022-08-29 NOTE — Addendum Note (Signed)
Addended by: Erlinda Hong T on: 08/29/2022 04:14 PM   Modules accepted: Orders

## 2022-08-30 ENCOUNTER — Ambulatory Visit (HOSPITAL_COMMUNITY)
Admission: RE | Admit: 2022-08-30 | Discharge: 2022-08-30 | Disposition: A | Discharge status: Home / Self Care | Payer: Medicare HMO | Source: Ambulatory Visit | Attending: Nephrology | Admitting: Nephrology

## 2022-08-30 VITALS — BP 129/71 | HR 79 | Temp 97.8°F | Resp 18

## 2022-08-30 DIAGNOSIS — N183 Chronic kidney disease, stage 3 unspecified: Secondary | ICD-10-CM | POA: Insufficient documentation

## 2022-08-30 LAB — RENAL FUNCTION PANEL
Albumin: 3.4 g/dL — ABNORMAL LOW (ref 3.5–5.0)
Anion gap: 12 (ref 5–15)
BUN: 94 mg/dL — ABNORMAL HIGH (ref 6–20)
CO2: 24 mmol/L (ref 22–32)
Calcium: 8.7 mg/dL — ABNORMAL LOW (ref 8.9–10.3)
Chloride: 97 mmol/L — ABNORMAL LOW (ref 98–111)
Creatinine, Ser: 3.99 mg/dL — ABNORMAL HIGH (ref 0.61–1.24)
GFR, Estimated: 18 mL/min — ABNORMAL LOW (ref >60)
Glucose, Bld: 98 mg/dL (ref 70–99)
Phosphorus: 4.6 mg/dL (ref 2.5–4.6)
Potassium: 3.1 mmol/L — ABNORMAL LOW (ref 3.5–5.1)
Sodium: 133 mmol/L — ABNORMAL LOW (ref 135–145)

## 2022-08-30 LAB — FERRITIN: Ferritin: 363 ng/mL — ABNORMAL HIGH (ref 24–336)

## 2022-08-30 LAB — POCT HEMOGLOBIN-HEMACUE: Hemoglobin: 11.6 g/dL — ABNORMAL LOW (ref 13.0–17.0)

## 2022-08-30 LAB — IRON AND TIBC
Iron: 44 ug/dL — ABNORMAL LOW (ref 45–182)
Saturation Ratios: 16 % — ABNORMAL LOW (ref 17.9–39.5)
TIBC: 276 ug/dL (ref 250–450)
UIBC: 232 ug/dL

## 2022-08-30 MED ORDER — DARBEPOETIN ALFA 200 MCG/0.4ML IJ SOSY
PREFILLED_SYRINGE | INTRAMUSCULAR | Status: AC
  Filled 2022-08-30: qty 0.4

## 2022-08-30 MED ORDER — DARBEPOETIN ALFA 200 MCG/0.4ML IJ SOSY
200.0000 ug | PREFILLED_SYRINGE | INTRAMUSCULAR | Status: DC
  Administered 2022-08-30: 200 ug via SUBCUTANEOUS

## 2022-08-31 ENCOUNTER — Ambulatory Visit: Payer: Medicare Other | Admitting: Endocrinology

## 2022-08-31 ENCOUNTER — Emergency Department (HOSPITAL_COMMUNITY)
Admission: EM | Admit: 2022-08-31 | Discharge: 2022-08-31 | Disposition: A | Discharge status: Home / Self Care | Payer: Medicare HMO | Attending: Emergency Medicine | Admitting: Emergency Medicine

## 2022-08-31 ENCOUNTER — Other Ambulatory Visit: Payer: Self-pay

## 2022-08-31 ENCOUNTER — Emergency Department (HOSPITAL_COMMUNITY): Payer: Medicare HMO

## 2022-08-31 DIAGNOSIS — Z7401 Bed confinement status: Secondary | ICD-10-CM | POA: Diagnosis not present

## 2022-08-31 DIAGNOSIS — Z794 Long term (current) use of insulin: Secondary | ICD-10-CM | POA: Diagnosis not present

## 2022-08-31 DIAGNOSIS — Z7901 Long term (current) use of anticoagulants: Secondary | ICD-10-CM | POA: Diagnosis not present

## 2022-08-31 DIAGNOSIS — S59902A Unspecified injury of left elbow, initial encounter: Secondary | ICD-10-CM | POA: Diagnosis not present

## 2022-08-31 DIAGNOSIS — E119 Type 2 diabetes mellitus without complications: Secondary | ICD-10-CM | POA: Diagnosis not present

## 2022-08-31 DIAGNOSIS — W19XXXA Unspecified fall, initial encounter: Secondary | ICD-10-CM

## 2022-08-31 DIAGNOSIS — M19022 Primary osteoarthritis, left elbow: Secondary | ICD-10-CM | POA: Diagnosis not present

## 2022-08-31 DIAGNOSIS — M79632 Pain in left forearm: Secondary | ICD-10-CM | POA: Diagnosis not present

## 2022-08-31 DIAGNOSIS — M25522 Pain in left elbow: Secondary | ICD-10-CM | POA: Diagnosis not present

## 2022-08-31 DIAGNOSIS — S59909A Unspecified injury of unspecified elbow, initial encounter: Secondary | ICD-10-CM | POA: Diagnosis not present

## 2022-08-31 DIAGNOSIS — R531 Weakness: Secondary | ICD-10-CM | POA: Diagnosis not present

## 2022-08-31 MED ORDER — OXYCODONE HCL 5 MG PO TABS
5.0000 mg | ORAL_TABLET | Freq: Once | ORAL | Status: AC
  Administered 2022-08-31: 5 mg via ORAL
  Filled 2022-08-31: qty 1

## 2022-08-31 MED ORDER — ONDANSETRON 4 MG PO TBDP
4.0000 mg | ORAL_TABLET | Freq: Once | ORAL | Status: AC
  Administered 2022-08-31: 4 mg via ORAL
  Filled 2022-08-31: qty 1

## 2022-08-31 NOTE — Discharge Instructions (Addendum)
Follow up with orthopedics, call to schedule an appointment.   Your x-rays showed some arthritis, but no broken bones or dislocation.  I recommend taking [phone] mg of Tylenol every 6-8 hours as needed for pain.  You may also use ice or heat to the elbow for no longer than 20 minutes at a time 3-4 times per day.

## 2022-08-31 NOTE — ED Provider Notes (Signed)
[place] EMERGENCY DEPARTMENT AT [facility] Provider Note   CSN: [id number] Arrival date & time: 08/31/22  0547     History  Chief Complaint  Patient presents with   Marletta Lor    [name] is a 47 y.o. male.  47 year old male brought in by EMS for complaint of left arm pain after fall.  Patient is right AKA was transferring this morning when he fell and hit his left elbow.  He is on Eliquis, denies hitting his head, no LOC, no other injuries.  Does state that he has history of chronic elbow pain.       Home Medications Prior to Admission medications   Medication Sig Start Date End Date Taking? Authorizing Provider  acidophilus (RISAQUAD) CAPS capsule Take 1 capsule by mouth daily.    [provider]  Alpha-Lipoic Acid 600 MG CAPS Take 600 mg by mouth daily.    [provider]  amLODipine (NORVASC) 10 MG tablet Take 10 mg by mouth at bedtime.    [provider]  B-D UF III MINI PEN NEEDLES 31G X 5 MM MISC USE 1 NEEDLE FIVE TIMES DAILY 07/05/22   Doran Stabler, DO  Blood Glucose Monitoring Suppl (ACCU-CHEK GUIDE ME) w/Device KIT Use to check blood sugar up to 3 times a day 05/11/22   Reather Littler, MD  carvedilol (COREG) 25 MG tablet TAKE 1 TABLET BY MOUTH TWICE DAILY WITH A MEAL 07/04/22   Atway, Rayann N, DO  chlorthalidone (HYGROTON) 25 MG tablet Take 25 mg by mouth daily. 04/18/21   [provider]  cloNIDine (CATAPRES - DOSED IN MG/24 HR) 0.1 mg/24hr patch 0.1 mg once a week. 05/04/21   [provider]  Cyanocobalamin (B-12) 500 MCG SUBL Place 500 mcg under the tongue daily.     [provider]  diclofenac Sodium (VOLTAREN) 1 % GEL Apply 2 g topically 4 (four) times daily. 01/31/21   Dolan Amen, MD  ELIQUIS 5 MG TABS tablet Take 1 tablet by mouth twice daily 06/12/22   Mapp, Gaylyn Cheers, MD  famotidine (PEPCID) 20 MG tablet Take 1 tablet (20 mg total) by mouth 2 (two) times daily. Patient taking differently: Take 20 mg by  mouth 2 (two) times daily as needed for heartburn. 05/07/18   Hedges, Tinnie Gens, PA-C  fluticasone (FLONASE) 50 MCG/ACT nasal spray Place 2 sprays into both nostrils daily. Patient taking differently: Place 2 sprays into both nostrils daily as needed for allergies. 03/29/20 03/29/21  Dolan Amen, MD  furosemide (LASIX) 80 MG tablet Take 1 tablet by mouth twice daily Patient taking differently: Take 80 mg by mouth 2 (two) times daily. 01/15/20   Dellia Cloud, MD  gabapentin (NEURONTIN) 100 MG capsule Take 1 capsule (100 mg total) by mouth 3 (three) times daily. 01/24/22   Rana Snare, DO  glucose blood (ACCU-CHEK GUIDE) test strip Check blood sugar up to 3 times a day 05/11/22   Reather Littler, MD  hydrALAZINE (APRESOLINE) 100 MG tablet Take 100 mg by mouth 3 (three) times daily. 07/13/20   [provider]  insulin degludec (TRESIBA FLEXTOUCH) 200 UNIT/ML FlexTouch Pen INJECT 48 UNITS SUBCUTANEOUSLY ONCE DAILY 04/15/22   Mapp, Gaylyn Cheers, MD  Insulin Pen Needle (B-D UF III MINI PEN NEEDLES) 31G X 5 MM MISC 1 each by Other route See admin instructions. USE 4-5 TIMES DAILY 03/08/20   Roylene Reason, MD  Lancet Devices [facility]) lancets Use to check blood sugars 5 times a day. Dx code:E11.9. Insulin dependent.  Patient taking differently: 1 each by Other route See admin instructions. Use to check blood sugars 5 times a day. Dx code:E11.9. Insulin dependent. 08/14/16   Geralyn Corwin Ratliff, DO  lidocaine (HM LIDOCAINE PATCH) 4 % Place 1 patch onto the skin daily. 12/22/21   Adron Bene, MD  lovastatin (MEVACOR) 20 MG tablet Take 1 tablet (20 mg total) by mouth daily. 01/24/22   Rana Snare, DO  methocarbamol (ROBAXIN) 500 MG tablet Take 1 tablet (500 mg total) by mouth at bedtime as needed for muscle spasms. 05/04/22   Lyndle Herrlich, MD  MOUNJARO 12.5 MG/0.5ML Pen INJECT 12.5 MG SUBCUTANEOUSLY ONCE A WEEK 07/07/22   Reather Littler, MD  NOVOLOG FLEXPEN 100 UNIT/ML FlexPen Inject 15-30  Units into the skin 3 (three) times daily with meals. Administer 15 units in the morning with breakfast, 25 units with lunch, and 30 units with dinner daily.  ICD Code:  E11.9 07/07/22 10/05/22  Chauncey Mann, DO  oxybutynin (DITROPAN) 5 MG tablet TAKE 1 TABLET BY MOUTH THREE TIMES DAILY 06/08/22   Doran Stabler, DO  polyethylene glycol (MIRALAX / GLYCOLAX) 17 g packet Take 17 g by mouth 2 (two) times daily. Patient taking differently: Take 17 g by mouth as needed for mild constipation. 09/02/19   [place], Carley D, DO  potassium chloride SA (KLOR-CON) 20 MEQ tablet Take 20 mEq by mouth 2 (two) times daily. 11/24/19   [provider]  senna-docusate (SENOKOT-S) 8.6-50 MG tablet Take 1 tablet by mouth daily. Patient taking differently: Take 1 tablet by mouth as needed for mild constipation. 09/26/19   Dolan Amen, MD  sildenafil (VIAGRA) 50 MG tablet Take 1-2 tablets (50-100 mg total) by mouth as needed for erectile dysfunction. Take 1 hour before needed. Do not take more than 2 tablets at once. 09/14/20   Verdene Lennert, MD  tamsulosin (FLOMAX) 0.4 MG CAPS capsule Take 1 capsule (0.4 mg total) by mouth at bedtime. 01/24/22   Rana Snare, DO      Allergies    Reglan [metoclopramide], Metformin and related, and Ozempic (0.25 or 0.5 mg-dose) [semaglutide(0.25 or 0.5mg -dos)]    Review of Systems   Review of Systems Negative except as per HPI Physical Exam Updated Vital Signs BP (!) 147/81 (BP Location: Right Arm)   Pulse 87   Temp 98.7 F (37.1 C) (Oral)   Resp 16   SpO2 100%  Physical Exam Vitals and nursing note reviewed.  Constitutional:      General: He is not in acute distress.    Appearance: He is well-developed. He is not diaphoretic.  HENT:     Head: Normocephalic and atraumatic.  Cardiovascular:     Pulses: Normal pulses.  Pulmonary:     Effort: Pulmonary effort is normal.  Musculoskeletal:        General: Tenderness present. No deformity.     Left elbow: No  deformity or lacerations. Decreased range of motion. Tenderness present in lateral epicondyle and olecranon process.     Comments: Generalized tenderness extends from the left elbow into the left forearm and hand.  No specific bony tenderness through the hand, wrist, forearm.  Skin:    General: Skin is warm and dry.     Findings: No bruising, erythema or rash.  Neurological:     Mental Status: He is alert and oriented to person, place, and time.     Sensory: No sensory deficit.  Psychiatric:        Behavior: Behavior normal.  ED Results / Procedures / Treatments   Labs (all labs ordered are listed, but only abnormal results are displayed) Labs Reviewed - No data to display  EKG None  Radiology No results found.  Procedures Procedures    Medications Ordered in ED Medications  oxyCODONE (Oxy IR/ROXICODONE) immediate release tablet 5 mg (5 mg Oral Given 08/31/22 0622)  ondansetron (ZOFRAN-ODT) disintegrating tablet 4 mg (4 mg Oral Given 08/31/22 0981)    ED Course/ Medical Decision Making/ A&P                                 Medical Decision Making Amount and/or Complexity of Data Reviewed Radiology: ordered.  Risk Prescription drug management.   47 year old male brought in by EMS after a fall while transferring this morning.  Reports left elbow injury.  Patient is on Eliquis, did not hit head, no LOC.  No other injuries.  He is found to tenderness over the lection on, vague tenderness extends into the forearm and hand without specific bony tenderness found.  Radial pulse present, sensation intact.  XR of the left elbow and forearm ordered.  Provided with oxy for pain.  Care signed out at change of shift pending XR results and dispo.         Final Clinical Impression(s) / ED Diagnoses Final diagnoses:  Fall, initial encounter  Left elbow pain    Rx / DC Orders ED Discharge Orders     None         Jeannie Fend, PA-C 08/31/22 1914     Gilda Crease, MD 08/31/22 [phone]

## 2022-08-31 NOTE — ED Notes (Signed)
Pt c/o nausea. PA informed of pts request. Primary RN updated

## 2022-08-31 NOTE — ED Triage Notes (Signed)
Pt had mechanical fall out of bed. Falling onto his left his. C/o left elbow pain. Denies head injury. No LOC.

## 2022-08-31 NOTE — ED Provider Notes (Signed)
Accepted handoff at shift change from Victor Melia, PA-C. Please see prior provider note for more detail.   Briefly: Patient is 47 y.o. presents to ED complaining of left arm pain after fall.  Patient was transferring this morning when he fell and hit his left elbow.  No head injury.  DDX: concern for elbow fracture  Plan: follow up on x-ray results and discharge home    Results for orders placed or performed during the hospital encounter of 08/30/22  Ferritin  Result Value Ref Range   Ferritin 363 (H) 24 - 336 ng/mL  Iron and TIBC  Result Value Ref Range   Iron 44 (L) 45 - 182 ug/dL   TIBC 161 096 - 045 ug/dL   Saturation Ratios 16 (L) 17.9 - 39.5 %   UIBC 232 ug/dL  Renal function panel  Result Value Ref Range   Sodium 133 (L) 135 - 145 mmol/L   Potassium 3.1 (L) 3.5 - 5.1 mmol/L   Chloride 97 (L) 98 - 111 mmol/L   CO2 24 22 - 32 mmol/L   Glucose, Bld 98 70 - 99 mg/dL   BUN 94 (H) 6 - 20 mg/dL   Creatinine, Ser 4.09 (H) 0.61 - 1.24 mg/dL   Calcium 8.7 (L) 8.9 - 10.3 mg/dL   Phosphorus 4.6 2.5 - 4.6 mg/dL   Albumin 3.4 (L) 3.5 - 5.0 g/dL   GFR, Estimated 18 (L) >60 mL/min   Anion gap 12 5 - 15  Hemoglobin-hemacue, POC  Result Value Ref Range   Hemoglobin 11.6 (L) 13.0 - 17.0 g/dL   *Note: Due to a large number of results and/or encounters for the requested time period, some results have not been displayed. A complete set of results can be found in Results Review.   No results found.   Physical Exam  BP (!) 147/81 (BP Location: Right Arm)   Pulse 87   Temp 98.7 F (37.1 C) (Oral)   Resp 16   SpO2 100%   Physical Exam Vitals and nursing note reviewed.  Constitutional:      General: He is not in acute distress.    Appearance: Normal appearance. He is not ill-appearing or diaphoretic.  Cardiovascular:     Rate and Rhythm: Normal rate and regular rhythm.  Pulmonary:     Effort: Pulmonary effort is normal.  Skin:    General: Skin is warm and dry.     Capillary  Refill: Capillary refill takes less than 2 seconds.  Neurological:     Mental Status: He is alert. Mental status is at baseline.  Psychiatric:        Mood and Affect: Mood normal.        Behavior: Behavior normal.     Procedures  Procedures  ED Course / MDM    Medical Decision Making Amount and/or Complexity of Data Reviewed Radiology: ordered.  Risk Prescription drug management.   7:03 AM  X-rays with evidence of osteoarthritis, but no acute fracture or dislocation.  Patient appropriate for discharge home.  He was provided with orthopedic follow-up information.    The patient has been appropriately medically screened and/or stabilized in the ED. I have low suspicion for any other emergent medical condition which would require further screening, evaluation or treatment in the ED or require inpatient management. At time of discharge the patient is hemodynamically stable and in no acute distress.  Patient is agreeable with discharge plan. We discussed strict return precautions for returning to the emergency department  and they verbalized understanding.          Melton Alar R, PA-C 08/31/22 2952    Pricilla Loveless, MD 08/31/22 [phone]

## 2022-08-31 NOTE — ED Notes (Signed)
RN called [facility].  

## 2022-09-05 ENCOUNTER — Encounter: Payer: Self-pay | Admitting: Endocrinology

## 2022-09-05 ENCOUNTER — Ambulatory Visit (INDEPENDENT_AMBULATORY_CARE_PROVIDER_SITE_OTHER): Payer: Medicare HMO | Admitting: Endocrinology

## 2022-09-05 VITALS — BP 122/70 | HR 86 | Ht 74.0 in

## 2022-09-05 DIAGNOSIS — Z794 Long term (current) use of insulin: Secondary | ICD-10-CM | POA: Diagnosis not present

## 2022-09-05 DIAGNOSIS — Z7985 Long-term (current) use of injectable non-insulin antidiabetic drugs: Secondary | ICD-10-CM | POA: Diagnosis not present

## 2022-09-05 DIAGNOSIS — E119 Type 2 diabetes mellitus without complications: Secondary | ICD-10-CM

## 2022-09-05 MED ORDER — TRESIBA FLEXTOUCH 200 UNIT/ML ~~LOC~~ SOPN: PEN_INJECTOR | SUBCUTANEOUS | 3 refills | Status: DC

## 2022-09-05 MED ORDER — GVOKE HYPOPEN 1-PACK 1 MG/0.2ML ~~LOC~~ SOAJ: 1.0000 mg | SUBCUTANEOUS | 2 refills | Status: DC | PRN

## 2022-09-05 MED ORDER — NOVOLOG FLEXPEN 100 UNIT/ML ~~LOC~~ SOPN: 5.0000 [IU] | PEN_INJECTOR | Freq: Three times a day (TID) | SUBCUTANEOUS | 4 refills | Status: DC

## 2022-09-05 MED ORDER — TIRZEPATIDE 15 MG/0.5ML ~~LOC~~ SOAJ: 15.0000 mg | SUBCUTANEOUS | 4 refills | Status: DC

## 2022-09-05 NOTE — Patient Instructions (Signed)
Diabetes regimen: Decrease tresiba to 42 units daily.   Increase Mounjaro to 15 mg weekly.  Novolog 5-25 units with meals 2 times a day , may adjust based on meals and experience.

## 2022-09-05 NOTE — Progress Notes (Signed)
Outpatient Endocrinology Note [place] Misty Foutz, MD  09/05/22  Patient's Name: [name]    DOB: [birth year]    MRN: [id number]                                                    REASON OF VISIT: Follow up for type 2 diabetes mellitus  PCP: Kathleen Lime, MD  HISTORY OF PRESENT ILLNESS:   [name] is a 47 y.o. old male with past medical history listed below, is here for follow up of type 2 diabetes mellitus.   Pertinent Diabetes History: Patient was diagnosed with type 2 diabetes mellitus in 2001.  Weight was 400 pounds at the time of diagnosis.  He was restarted on insulin therapy initially.  He was not able to tolerate metformin due to diarrhea.  Chronic Diabetes Complications : Retinopathy: Unknown.  Reports following with retina specialist however denies diabetes retinopathy. Nephropathy: CKD IV following with nephrology.  Microalbuminuria present. Peripheral neuropathy: Diabetic foot ulcers, status post right BKA 2015. Coronary artery disease: no Stroke: no  Relevant comorbidities and cardiovascular risk factors: Obesity: yes Body mass index is 44.1 kg/m.  Hypertension: yes Hyperlipidemia. Yes, on a statin.  Current / Home Diabetic regimen includes: Tresiba 48 units daily. NovoLog 0 units breakfast 10-30 units before lunch and 10-30 units at dinner. Mounjaro 12.5 mg weekly.  Prior diabetic medications: Metformin stopped due to diarrhea. Ozempic had vomiting.  Glycemic data:    CONTINUOUS GLUCOSE MONITORING SYSTEM (CGMS) INTERPRETATION: At today's visit, we reviewed CGM downloads. The full report is scanned in the media. Reviewing the CGM trends, blood glucose are as follows:  FreeStyle Libre 2 CGM-  Sensor Download (Sensor download was reviewed and summarized below.) Dates: August 14 to September 05, 2022, 14 days. Sensor Average: 116  Glucose Management Indicator: 6.1% Glucose Variability: 27.6%  % data captured: 93%  Glycemic Trends:  <54: 0% 54-70:  2% 71-180: 92% 181-250: 6% 251-400: 0%  Interpretation: -Most of the blood sugar are acceptable overnight and during the afternoon.  He is having frequent hypoglycemia with blood sugar up to 60s fasting in the early morning, contributed by early morning workout, and rare hypoglycemia with blood sugar in 60s in the afternoon after lunch and around suppertime at 8 to 9 PM.  No significant hyperglycemia.  Hypoglycemia: Patient has hypoglycemic episodes. Patient has hypoglycemia awareness.  Factors modifying glucose control: 1.  Diabetic diet assessment: 3 meals a day.  2.  Staying active or exercising: Workout a mostly every day 5-6 times a week in the morning.  3.  Medication compliance: compliant all of the time.  Interval history 09/05/22 She is limited as reviewed above.  He is having frequent hypoglycemia mostly in the early morning.  No other complaints today.  He is tolerating Mounjaro well.  He adjusts the NovoLog from 10 to 30 units depending upon the blood sugar and his meals.  REVIEW OF SYSTEMS As per history of present illness.   PAST MEDICAL HISTORY: Past Medical History:  Diagnosis Date   Acute venous embolism and thrombosis of deep vessels of proximal lower extremity (HCC) 07/19/2011   Anemia    Anginal pain (HCC)    pt denies   Anxiety    Chest pain, neg MI, normal coronaries by cath 02/18/2013   pt denies   CHF (congestive heart  failure) (HCC)    Chronic renal disease, stage IV (HCC) 02/19/2013   Colon polyps    Depression    Diabetic ulcer of right foot (HCC)    DVT (deep venous thrombosis) (HCC) 09/2002   patient reports additional DVTs in '06 & '11 (unconfirmed)   ED (erectile dysfunction)    GERD (gastroesophageal reflux disease)    History of blood transfusion    "related to OR" (10/31/2016)   Hyperlipidemia 02/19/2013   Hypertension    Nausea & vomiting    "constant for the last couple weeks" (10/31/2016)   Nephrotic syndrome    Obesity    BMI  44, weight 346 pounds 01/30/14   OSA (obstructive sleep apnea) 02/28/2018   Mild obstructive sleep apnea with an AHI of 9.8/h but severe during REM sleep with an AHI of 33.8/h.  Oxygen saturations dropped to 86% and there was moderate snoring   Peripheral edema    Pneumonia    Prosthesis adjustment 08/17/2016   Pulmonary embolism (HCC) 09/2002   treated with 6 months of warfarin   Pyelonephritis 02/02/2018   Type I diabetes mellitus (HCC) dx'd 2001   Urinary retention     PAST SURGICAL HISTORY: Past Surgical History:  Procedure Laterality Date   AMPUTATION Right 12/22/2013   Procedure: AMPUTATION BELOW KNEE;  Surgeon: Toni Arthurs, MD;  Location: [facility] OR;  Service: Orthopedics;  Laterality: Right;   AMPUTATION Right 02/19/2014   Procedure: RIGHT ABOVE KNEE AMPUTATION ;  Surgeon: Toni Arthurs, MD;  Location: [facility] OR;  Service: Orthopedics;  Laterality: Right;   blood clot removal  2016   CARDIAC CATHETERIZATION  02/18/2013   normal coronaries   ESOPHAGOGASTRODUODENOSCOPY N/A 11/02/2016   Procedure: ESOPHAGOGASTRODUODENOSCOPY (EGD);  Surgeon: Iva Boop, MD;  Location: [facility] ENDOSCOPY;  Service: Endoscopy;  Laterality: N/A;   I & D EXTREMITY Right 12/14/2013   Procedure: IRRIGATION AND DEBRIDEMENT RIGHT FOOT;  Surgeon: Verlee Rossetti, MD;  Location[facility] OR;  Service: Orthopedics;  Laterality: Right;   INCISION AND DRAINAGE ABSCESS  2007; 2015   "back"   INCISION AND DRAINAGE OF WOUND Right 12/22/2013   Procedure: I&D RIGHT BUTTOCK;  Surgeon: Toni Arthurs, MD;  Location: [facility] OR;  Service: Orthopedics;  Laterality: Right;   LEFT HEART CATHETERIZATION WITH CORONARY ANGIOGRAM N/A 02/18/2013   Procedure: LEFT HEART CATHETERIZATION WITH CORONARY ANGIOGRAM;  Surgeon: Peter M Swaziland, MD;  Location: [facility] CATH LAB;  Service: Cardiovascular;  Laterality: N/A;    ALLERGIES: Allergies  Allergen Reactions   Reglan [Metoclopramide] Other (See Comments)    Dysphoric reaction   Metformin And Related Diarrhea    Ozempic (0.25 Or 0.5 Mg-Dose) [Semaglutide(0.25 Or 0.5mg -Dos)] Nausea And Vomiting    FAMILY HISTORY:  Family History  Problem Relation Age of Onset   Heart disease Mother    Diabetes Mother    Diabetes Maternal Uncle    Diabetes Cousin     SOCIAL HISTORY: Social History   Socioeconomic History   Marital status: Single    Spouse name: Not on file   Number of children: 0   Years of education: 15.5   Highest education level: Some college, no degree  Occupational History   Occupation: student    Comment: [employer]   Occupation: disability  Tobacco Use   Smoking status: Never   Smokeless tobacco: Never  Vaping Use   Vaping status: Never Used  Substance and Sexual Activity   Alcohol use: Yes    Comment: 10/31/2016 "a few beers q 6  months or so"   Drug use: No   Sexual activity: Yes  Other Topics Concern   Not on file  Social History Narrative   Financial assistance approved for 100% discount at [facility] and has [facility] card per Rudell Cobb   09/01/2009.      Lives in [place] with mother and sister.            Social Determinants of Health   Financial Resource Strain: Medium Risk (10/01/2020)   Overall Financial Resource Strain (CARDIA)    Difficulty of Paying Living Expenses: Somewhat hard  Food Insecurity: No Food Insecurity (01/24/2022)   Hunger Vital Sign    Worried About Running Out of Food in the Last Year: Never true    Ran Out of Food in the Last Year: Never true  Transportation Needs: No Transportation Needs (01/24/2022)   PRAPARE - Administrator, Civil Service (Medical): No    Lack of Transportation (Non-Medical): No  Physical Activity: Sufficiently Active (08/20/2019)   Exercise Vital Sign    Days of Exercise per Week: 6 days    Minutes of Exercise per Session: 60 min  Stress: No Stress Concern Present (08/20/2019)   [employer] of Occupational Health - Occupational Stress Questionnaire    Feeling of Stress : Not at all  Social Connections:  Socially Isolated (12/22/2021)   Social Connection and Isolation Panel [NHANES]    Frequency of Communication with Friends and Family: More than three times a week    Frequency of Social Gatherings with Friends and Family: More than three times a week    Attends Religious Services: Never    Database administrator or Organizations: No    Attends Banker Meetings: Never    Marital Status: Never married    MEDICATIONS:  Current Outpatient Medications  Medication Sig Dispense Refill   acidophilus (RISAQUAD) CAPS capsule Take 1 capsule by mouth daily.     Alpha-Lipoic Acid 600 MG CAPS Take 600 mg by mouth daily.     amLODipine (NORVASC) 10 MG tablet Take 10 mg by mouth at bedtime.     B-D UF III MINI PEN NEEDLES 31G X 5 MM MISC USE 1 NEEDLE FIVE TIMES DAILY 400 each 0   Blood Glucose Monitoring Suppl (ACCU-CHEK GUIDE ME) w/Device KIT Use to check blood sugar up to 3 times a day 1 kit 1   carvedilol (COREG) 25 MG tablet TAKE 1 TABLET BY MOUTH TWICE DAILY WITH A MEAL 180 tablet 1   chlorthalidone (HYGROTON) 25 MG tablet Take 25 mg by mouth daily.     cloNIDine (CATAPRES - DOSED IN MG/24 HR) 0.1 mg/24hr patch 0.1 mg once a week.     Cyanocobalamin (B-12) 500 MCG SUBL Place 500 mcg under the tongue daily.      diclofenac Sodium (VOLTAREN) 1 % GEL Apply 2 g topically 4 (four) times daily. 100 g 3   ELIQUIS 5 MG TABS tablet Take 1 tablet by mouth twice daily 180 tablet 0   famotidine (PEPCID) 20 MG tablet Take 1 tablet (20 mg total) by mouth 2 (two) times daily. (Patient taking differently: Take 20 mg by mouth 2 (two) times daily as needed for heartburn.) 30 tablet 0   furosemide (LASIX) 80 MG tablet Take 1 tablet by mouth twice daily (Patient taking differently: Take 80 mg by mouth 2 (two) times daily.) 180 tablet 0   gabapentin (NEURONTIN) 100 MG capsule Take 1 capsule (100 mg total) by mouth  3 (three) times daily. 270 capsule 3   Glucagon (GVOKE HYPOPEN 1-PACK) 1 MG/0.2ML SOAJ Inject  1 mg into the skin as needed (low blood sugar with impaired consciousness). 0.4 mL 2   glucose blood (ACCU-CHEK GUIDE) test strip Check blood sugar up to 3 times a day 300 each 3   hydrALAZINE (APRESOLINE) 100 MG tablet Take 100 mg by mouth 3 (three) times daily.     Insulin Pen Needle (B-D UF III MINI PEN NEEDLES) 31G X 5 MM MISC 1 each by Other route See admin instructions. USE 4-5 TIMES DAILY 200 each 0   Lancet Devices (ACCU-CHEK SOFTCLIX) lancets Use to check blood sugars 5 times a day. Dx code:E11.9. Insulin dependent. (Patient taking differently: 1 each by Other route See admin instructions. Use to check blood sugars 5 times a day. Dx code:E11.9. Insulin dependent.) 6 each 3   lidocaine (HM LIDOCAINE PATCH) 4 % Place 1 patch onto the skin daily. 5 patch 0   lovastatin (MEVACOR) 20 MG tablet Take 1 tablet (20 mg total) by mouth daily. 90 tablet 3   methocarbamol (ROBAXIN) 500 MG tablet Take 1 tablet (500 mg total) by mouth at bedtime as needed for muscle spasms. 15 tablet 0   oxybutynin (DITROPAN) 5 MG tablet TAKE 1 TABLET BY MOUTH THREE TIMES DAILY 90 tablet 3   polyethylene glycol (MIRALAX / GLYCOLAX) 17 g packet Take 17 g by mouth 2 (two) times daily. (Patient taking differently: Take 17 g by mouth as needed for mild constipation.) 14 each 0   potassium chloride SA (KLOR-CON) 20 MEQ tablet Take 20 mEq by mouth 2 (two) times daily.     senna-docusate (SENOKOT-S) 8.6-50 MG tablet Take 1 tablet by mouth daily. (Patient taking differently: Take 1 tablet by mouth as needed for mild constipation.) 90 tablet 3   sildenafil (VIAGRA) 50 MG tablet Take 1-2 tablets (50-100 mg total) by mouth as needed for erectile dysfunction. Take 1 hour before needed. Do not take more than 2 tablets at once. 20 tablet 0   tamsulosin (FLOMAX) 0.4 MG CAPS capsule Take 1 capsule (0.4 mg total) by mouth at bedtime. 90 capsule 3   tirzepatide (MOUNJARO) 15 MG/0.5ML Pen Inject 15 mg into the skin once a week. 6 mL 4    fluticasone (FLONASE) 50 MCG/ACT nasal spray Place 2 sprays into both nostrils daily. (Patient taking differently: Place 2 sprays into both nostrils daily as needed for allergies.) 15.8 mL 2   insulin degludec (TRESIBA FLEXTOUCH) 200 UNIT/ML FlexTouch Pen INJECT 42 UNITS SUBCUTANEOUSLY ONCE DAILY 45 mL 3   NOVOLOG FLEXPEN 100 UNIT/ML FlexPen Inject 5-25 Units into the skin 3 (three) times daily with meals. Administer 0 units in the morning with breakfast, 5-25 units with lunch, and 5-25 units with dinner daily.  ICD Code:  E11.9 45 mL 4   No current facility-administered medications for this visit.    PHYSICAL EXAM: Vitals:   09/05/22 1240  BP: 122/70  Pulse: 86  SpO2: 96%  Height: 6\' 2"  (1.88 m)   Body mass index is 44.1 kg/m.  Wt Readings from Last 3 Encounters:  08/04/22 (!) 343 lb 8 oz (155.8 kg)  05/11/22 (!) 328 lb (148.8 kg)  01/24/22 (!) 349 lb 6.4 oz (158.5 kg)    General: Well developed, well nourished male in no apparent distress.  HEENT: AT/[place], no external lesions.  Eyes: Conjunctiva clear and no icterus. Neck: Neck supple  Lungs: Respirations not labored Neurologic: Alert, oriented, normal  speech Extremities / Skin: Dry. Rt BKA. Psychiatric: Does not appear depressed or anxious  Diabetic Foot Exam - Simple   No data filed    LABS Reviewed Lab Results  Component Value Date   HGBA1C 5.0 08/28/2022   HGBA1C 5.2 05/09/2022   HGBA1C 5.2 12/30/2021   Lab Results  Component Value Date   FRUCTOSAMINE 271 08/28/2022   FRUCTOSAMINE 299 (H) 05/09/2022   FRUCTOSAMINE 294 (H) 08/23/2021   Lab Results  Component Value Date   CHOL 119 12/30/2021   HDL 38.10 (L) 12/30/2021   LDLCALC 60 12/30/2021   TRIG 104.0 12/30/2021   CHOLHDL 3 12/30/2021   Lab Results  Component Value Date   MICRALBCREAT 1,528.4 (H) 02/15/2017   MICRALBCREAT 818.2 (H) 08/14/2013   Lab Results  Component Value Date   CREATININE 3.99 (H) 08/30/2022   Lab Results  Component Value Date    GFR 28.51 (L) 04/15/2020    ASSESSMENT / PLAN  1. Insulin dependent type 2 diabetes mellitus, controlled (HCC)   2. Severe obesity (BMI >= 40) (HCC)   3. Long-term current use of injectable noninsulin antidiabetic medication     Diabetes Mellitus type 2, complicated by peripheral neuropathy status post right BKA, CKD. - Diabetic status / severity: Controlled  Lab Results  Component Value Date   HGBA1C 5.0 08/28/2022    - Hemoglobin A1c goal : <7%  CGM data reviewed most of the blood sugar acceptable with frequent hypoglycemia especially in the early morning and rarely in the late afternoon and evening.  - Medications: Adjusted as follows.  Decrease tresiba to 42 units daily.   Increase Mounjaro to 15 mg weekly.  Novolog 5-25 units with meals 2 times a day , may adjust based on meals and experience.  No NovoLog with breakfast.  Asked to call the clinic if he continues to have hypoglycemia after the changes we made today,  probably need to further decrease the dose of insulin.  - Home glucose testing: Continue CGM and check as needed. - Discussed/ Gave Hypoglycemia treatment plan.  Glucagon Emergency Kit prescribed.  # Consult : not required at this time.   # He has diabetic nephropathy, following with nephrology.   Last  Lab Results  Component Value Date   MICRALBCREAT 1,528.4 (H) 02/15/2017    # Foot check nightly, history of diabetic foot ulcer status post right BKA.  # Annual dilated diabetic eye exams.  Following with retina specialist, status post retinopathy unknown at this time.  - Diet: Eat reasonable portion sizes to promote a healthy weight - Life style / activity / exercise: Discussed.  He regularly workout 5-6 times a week.  2. Blood pressure  -  BP Readings from Last 1 Encounters:  09/05/22 122/70    - Control is in target.  - No change in current plans.  3. Lipid status / Hyperlipidemia - Last  Lab Results  Component Value Date   LDLCALC  60 12/30/2021   - Continue lovastatin 20 mg daily.  [name] was seen today for follow-up.  Diagnoses and all orders for this visit:  Insulin dependent type 2 diabetes mellitus, controlled (HCC)  Severe obesity (BMI >= 40) (HCC)  Long-term current use of injectable noninsulin antidiabetic medication  Other orders -     insulin degludec (TRESIBA FLEXTOUCH) 200 UNIT/ML FlexTouch Pen; INJECT 42 UNITS SUBCUTANEOUSLY ONCE DAILY -     tirzepatide (MOUNJARO) 15 MG/0.5ML Pen; Inject 15 mg into the skin once a week. -  NOVOLOG FLEXPEN 100 UNIT/ML FlexPen; Inject 5-25 Units into the skin 3 (three) times daily with meals. Administer 0 units in the morning with breakfast, 5-25 units with lunch, and 5-25 units with dinner daily.  ICD Code:  E11.9 -     Glucagon (GVOKE HYPOPEN 1-PACK) 1 MG/0.2ML SOAJ; Inject 1 mg into the skin as needed (low blood sugar with impaired consciousness).    DISPOSITION Follow up in clinic in 3 months suggested.   All questions answered and patient verbalized understanding of the plan.  [place]raq Lounette Sloan, MD [facility] Endocrinology [facility] [address] [place], [address] [place], [place] [address] Phone # [phone]  At least part of this note was generated using voice recognition software. Inadvertent word errors may have occurred, which were not recognized during the proofreading process.

## 2022-09-06 ENCOUNTER — Telehealth: Payer: Self-pay

## 2022-09-06 NOTE — Transitions of Care (Post Inpatient/ED Visit) (Signed)
   09/06/2022  Name: Nels Shaddix MRN: [id number] DOB: April 20, 1975  Today's TOC FU Call Status: Today's TOC FU Call Status:: Unsuccessful Call (1st Attempt) Unsuccessful Call (1st Attempt) Date: 09/06/22  Attempted to reach the patient regarding the most recent Inpatient/ED visit.  Follow Up Plan: Additional outreach attempts will be made to reach the patient to complete the Transitions of Care (Post Inpatient/ED visit) call.   Jodelle Gross RN, BSN, CCM [facility] RN Care Coordinator/ Transitions of Care Direct Dial: [phone]  Fax: [phone]

## 2022-09-07 ENCOUNTER — Telehealth: Payer: Self-pay

## 2022-09-07 NOTE — Transitions of Care (Post Inpatient/ED Visit) (Signed)
   09/07/2022  Name: [name] MRN: [id number] DOB: [birth year]  Today's TOC FU Call Status: Today's TOC FU Call Status:: Successful TOC FU Call Completed TOC FU Call Complete Date: 09/07/22 Patient's Name and Date of Birth confirmed.  Transition Care Management Follow-up Telephone Call Date of Discharge: 08/31/22 Discharge Facility: [facility]) Type of Discharge: Emergency Department Reason for ED Visit: Orthopedic Conditions Orthopedic/Injury Diagnosis: Sprain or Strain (Left arm pain) How have you been since you were released from the hospital?: Better (Patient notes his arm is better but he has requested a referral to Ortho to have it checked.  He is also requesting a light weight wheelchair from [employer]. Message sent to his PCP) Any questions or concerns?: Yes Patient Questions/Concerns:: Patient notes his arm is better but he has requested a referral to Ortho to have it checked. He is also requesting a light weight wheelchair from [employer]. Message sent to his PCP Patient Questions/Concerns Addressed: Notified Provider of Patient Questions/Concerns  Items Reviewed: Did you receive and understand the discharge instructions provided?: Yes Medications obtained,verified, and reconciled?:  (Medications not reviewed) Any new allergies since your discharge?: No Dietary orders reviewed?: No Do you have support at home?: No  Medications Reviewed Today: Medications Reviewed Today   Medications were not reviewed in this encounter     Home Care and Equipment/Supplies: Were Home Health Services Ordered?: No Any new equipment or medical supplies ordered?: No  Functional Questionnaire: Do you need assistance with bathing/showering or dressing?: No Do you need assistance with meal preparation?: No Do you need assistance with eating?: No Do you have difficulty maintaining continence: No Do you need assistance with getting out of bed/getting out of a chair/moving?: No Do you have  difficulty managing or taking your medications?: No  Follow up appointments reviewed: PCP Follow-up appointment confirmed?: No (Patient requesting referral for Ortho) Specialist Hospital Follow-up appointment confirmed?: No Do you need transportation to your follow-up appointment?: No Do you understand care options if your condition(s) worsen?: Yes-patient verbalized understanding  SDOH Interventions Today    Flowsheet Row Most Recent Value  SDOH Interventions   Food Insecurity Interventions Intervention Not Indicated  Transportation Interventions Intervention Not Indicated  Utilities Interventions Intervention Not Indicated      TOC Interventions Today    Flowsheet Row Most Recent Value  TOC Interventions   TOC Interventions Discussed/Reviewed TOC Interventions Discussed, TOC Interventions Reviewed, Contacted provider for patient needs     Jodelle Gross RN, BSN, CCM [place]  [facility] RN Care Coordinator/ Transitions of Care Direct Dial: [phone]  Fax: [phone]

## 2022-09-13 ENCOUNTER — Other Ambulatory Visit: Payer: Self-pay

## 2022-09-13 DIAGNOSIS — N2581 Secondary hyperparathyroidism of renal origin: Secondary | ICD-10-CM | POA: Diagnosis not present

## 2022-09-13 DIAGNOSIS — N184 Chronic kidney disease, stage 4 (severe): Secondary | ICD-10-CM | POA: Diagnosis not present

## 2022-09-13 DIAGNOSIS — E1122 Type 2 diabetes mellitus with diabetic chronic kidney disease: Secondary | ICD-10-CM | POA: Diagnosis not present

## 2022-09-13 DIAGNOSIS — Z86718 Personal history of other venous thrombosis and embolism: Secondary | ICD-10-CM | POA: Diagnosis not present

## 2022-09-13 DIAGNOSIS — Z794 Long term (current) use of insulin: Secondary | ICD-10-CM | POA: Diagnosis not present

## 2022-09-13 DIAGNOSIS — E669 Obesity, unspecified: Secondary | ICD-10-CM | POA: Diagnosis not present

## 2022-09-13 DIAGNOSIS — D631 Anemia in chronic kidney disease: Secondary | ICD-10-CM | POA: Diagnosis not present

## 2022-09-13 DIAGNOSIS — I130 Hypertensive heart and chronic kidney disease with heart failure and stage 1 through stage 4 chronic kidney disease, or unspecified chronic kidney disease: Secondary | ICD-10-CM | POA: Diagnosis not present

## 2022-09-13 DIAGNOSIS — E876 Hypokalemia: Secondary | ICD-10-CM | POA: Diagnosis not present

## 2022-09-13 MED ORDER — APIXABAN 5 MG PO TABS: 5.0000 mg | ORAL_TABLET | Freq: Two times a day (BID) | ORAL | 0 refills | Status: DC

## 2022-09-20 ENCOUNTER — Ambulatory Visit (HOSPITAL_COMMUNITY)
Admission: RE | Admit: 2022-09-20 | Discharge: 2022-09-20 | Disposition: A | Discharge status: Home / Self Care | Payer: Medicare HMO | Source: Ambulatory Visit | Attending: Nephrology | Admitting: Nephrology

## 2022-09-20 VITALS — BP 126/82 | HR 88 | Temp 97.1°F | Resp 18

## 2022-09-20 DIAGNOSIS — N183 Chronic kidney disease, stage 3 unspecified: Secondary | ICD-10-CM | POA: Insufficient documentation

## 2022-09-20 LAB — RENAL FUNCTION PANEL
Albumin: 3.5 g/dL (ref 3.5–5.0)
Anion gap: 10 (ref 5–15)
BUN: 77 mg/dL — ABNORMAL HIGH (ref 6–20)
CO2: 24 mmol/L (ref 22–32)
Calcium: 8.9 mg/dL (ref 8.9–10.3)
Chloride: 103 mmol/L (ref 98–111)
Creatinine, Ser: 3.78 mg/dL — ABNORMAL HIGH (ref 0.61–1.24)
GFR, Estimated: 19 mL/min — ABNORMAL LOW (ref >60)
Glucose, Bld: 127 mg/dL — ABNORMAL HIGH (ref 70–99)
Phosphorus: 4.3 mg/dL (ref 2.5–4.6)
Potassium: 3.5 mmol/L (ref 3.5–5.1)
Sodium: 137 mmol/L (ref 135–145)

## 2022-09-20 LAB — IRON AND TIBC
Iron: 46 ug/dL (ref 45–182)
Saturation Ratios: 16 % — ABNORMAL LOW (ref 17.9–39.5)
TIBC: 283 ug/dL (ref 250–450)
UIBC: 237 ug/dL

## 2022-09-20 LAB — FERRITIN: Ferritin: 383 ng/mL — ABNORMAL HIGH (ref 24–336)

## 2022-09-20 LAB — POCT HEMOGLOBIN-HEMACUE: Hemoglobin: 10.5 g/dL — ABNORMAL LOW (ref 13.0–17.0)

## 2022-09-20 MED ORDER — DARBEPOETIN ALFA 200 MCG/0.4ML IJ SOSY
200.0000 ug | PREFILLED_SYRINGE | INTRAMUSCULAR | Status: DC
  Administered 2022-09-20: 200 ug via SUBCUTANEOUS

## 2022-09-20 MED ORDER — DARBEPOETIN ALFA 200 MCG/0.4ML IJ SOSY
PREFILLED_SYRINGE | INTRAMUSCULAR | Status: AC
  Filled 2022-09-20: qty 0.4

## 2022-09-27 DIAGNOSIS — E113311 Type 2 diabetes mellitus with moderate nonproliferative diabetic retinopathy with macular edema, right eye: Secondary | ICD-10-CM | POA: Diagnosis not present

## 2022-09-27 DIAGNOSIS — E113313 Type 2 diabetes mellitus with moderate nonproliferative diabetic retinopathy with macular edema, bilateral: Secondary | ICD-10-CM | POA: Diagnosis not present

## 2022-09-27 DIAGNOSIS — H3582 Retinal ischemia: Secondary | ICD-10-CM | POA: Diagnosis not present

## 2022-09-27 DIAGNOSIS — H35033 Hypertensive retinopathy, bilateral: Secondary | ICD-10-CM | POA: Diagnosis not present

## 2022-09-27 DIAGNOSIS — E113312 Type 2 diabetes mellitus with moderate nonproliferative diabetic retinopathy with macular edema, left eye: Secondary | ICD-10-CM | POA: Diagnosis not present

## 2022-09-27 DIAGNOSIS — H3581 Retinal edema: Secondary | ICD-10-CM | POA: Diagnosis not present

## 2022-09-29 ENCOUNTER — Ambulatory Visit (INDEPENDENT_AMBULATORY_CARE_PROVIDER_SITE_OTHER): Payer: Medicare HMO | Admitting: Orthopaedic Surgery

## 2022-09-29 ENCOUNTER — Encounter: Payer: Self-pay | Admitting: Orthopaedic Surgery

## 2022-09-29 ENCOUNTER — Other Ambulatory Visit (INDEPENDENT_AMBULATORY_CARE_PROVIDER_SITE_OTHER): Payer: Medicare HMO

## 2022-09-29 DIAGNOSIS — M25552 Pain in left hip: Secondary | ICD-10-CM

## 2022-09-29 NOTE — Progress Notes (Signed)
Office Visit Note   Patient: [name]           Date of Birth: 1975/11/06           MRN: [id number] Visit Date: 09/29/2022              Requested by: Kathleen Lime, MD [address] [place],  [place] [address] PCP: Kathleen Lime, MD   Assessment & Plan: Visit Diagnoses:  1. Pain in left hip     Plan: Patient is a 47 year old gentleman with left hip adductor strain.  Recommend RICE and modify activity as needed.  He may pick up compression brace that he saw on [employer] which I think is worth trying.  Follow-up as needed.  Follow-Up Instructions: No follow-ups on file.   Orders:  Orders Placed This Encounter  Procedures   XR HIP UNILAT W OR W/O PELVIS 2-3 VIEWS LEFT   No orders of the defined types were placed in this encounter.     Procedures: No procedures performed   Clinical Data: No additional findings.   Subjective: Chief Complaint  Patient presents with   Left Hip - Pain   Left Elbow - Pain    HPI Patient is a 47 year old gentleman who comes in for evaluation of left hip and groin pain.  Is any injuries.  He is very active and lifts weights in the gym.  He is having trouble dead bugs exercises.  Denies any numbness and tingling. Review of Systems  Constitutional: Negative.   HENT: Negative.    Eyes: Negative.   Respiratory: Negative.    Cardiovascular: Negative.   Gastrointestinal: Negative.   Endocrine: Negative.   Genitourinary: Negative.   Skin: Negative.   Allergic/Immunologic: Negative.   Neurological: Negative.   Hematological: Negative.   Psychiatric/Behavioral: Negative.    All other systems reviewed and are negative.    Objective: Vital Signs: There were no vitals taken for this visit.  Physical Exam Vitals and nursing note reviewed.  Constitutional:      Appearance: He is well-developed.  Pulmonary:     Effort: Pulmonary effort is normal.  Abdominal:     Palpations: Abdomen is soft.  Skin:    General: Skin is warm.   Neurological:     Mental Status: He is alert and oriented to person, place, and time.  Psychiatric:        Behavior: Behavior normal.        Thought Content: Thought content normal.        Judgment: Judgment normal.     Ortho Exam Emanation of the left hip shows a excellent fluid painless range of motion.  He has tenderness to the adductor tendon. Specialty Comments:  No specialty comments available.  Imaging: No results found.   PMFS History: Patient Active Problem List   Diagnosis Date Noted   Elbow strain, left, initial encounter 06/06/2022   Medial epicondylitis of elbow, left 05/06/2022   Diabetic neuropathy (HCC) 01/24/2022   Trapezius strain, left, initial encounter 02/02/2021   Left shoulder pain 12/30/2020   Acute pulmonary edema (HCC)    Benign paroxysmal positional vertigo 09/14/2020   Anemia associated with chronic renal failure    Hemoglobinuria 12/12/2018   OSA (obstructive sleep apnea) 02/28/2018   Chronic diastolic heart failure (HCC) 02/02/2018   Preventative health care 08/15/2017   Erectile dysfunction 04/22/2017   History of pulmonary embolus (PE) 06/09/2016   Depression 05/04/2016   Hypogonadism in male 05/04/2016   Urinary retention 05/04/2016  Dyslipidemia 08/12/2014   Unilateral AKA, right (HCC) 12/26/2013   Hyperlipidemia 02/19/2013   S/P cardiac cath, 02/18/13, normal coronaries 02/19/2013   Chronic renal insufficiency, stage 3 (moderate) (HCC) 02/19/2013   Hypertension 07/24/2011   Long term current use of anticoagulant therapy 07/19/2011   History of DVT (deep vein thrombosis) 07/28/2009   Morbid obesity (HCC) 10/26/2005   Insulin dependent type 2 diabetes mellitus, controlled (HCC) 01/09/2002   Past Medical History:  Diagnosis Date   Acute venous embolism and thrombosis of deep vessels of proximal lower extremity (HCC) 07/19/2011   Anemia    Anginal pain (HCC)    pt denies   Anxiety    Chest pain, neg MI, normal coronaries by cath  02/18/2013   pt denies   CHF (congestive heart failure) (HCC)    Chronic renal disease, stage IV (HCC) 02/19/2013   Colon polyps    Depression    Diabetic ulcer of right foot (HCC)    DVT (deep venous thrombosis) (HCC) 09/2002   patient reports additional DVTs in '06 & '11 (unconfirmed)   ED (erectile dysfunction)    GERD (gastroesophageal reflux disease)    History of blood transfusion    "related to OR" (10/31/2016)   Hyperlipidemia 02/19/2013   Hypertension    Nausea & vomiting    "constant for the last couple weeks" (10/31/2016)   Nephrotic syndrome    Obesity    BMI 44, weight 346 pounds 01/30/14   OSA (obstructive sleep apnea) 02/28/2018   Mild obstructive sleep apnea with an AHI of 9.8/h but severe during REM sleep with an AHI of 33.8/h.  Oxygen saturations dropped to 86% and there was moderate snoring   Peripheral edema    Pneumonia    Prosthesis adjustment 08/17/2016   Pulmonary embolism (HCC) 09/2002   treated with 6 months of warfarin   Pyelonephritis 02/02/2018   Type I diabetes mellitus (HCC) dx'd 2001   Urinary retention     Family History  Problem Relation Age of Onset   Heart disease Mother    Diabetes Mother    Diabetes Maternal Uncle    Diabetes Cousin     Past Surgical History:  Procedure Laterality Date   AMPUTATION Right 12/22/2013   Procedure: AMPUTATION BELOW KNEE;  Surgeon: Toni Arthurs, MD;  Location: [facility] OR;  Service: Orthopedics;  Laterality: Right;   AMPUTATION Right 02/19/2014   Procedure: RIGHT ABOVE KNEE AMPUTATION ;  Surgeon: Toni Arthurs, MD;  Location: [facility] OR;  Service: Orthopedics;  Laterality: Right;   blood clot removal  2016   CARDIAC CATHETERIZATION  02/18/2013   normal coronaries   ESOPHAGOGASTRODUODENOSCOPY N/A 11/02/2016   Procedure: ESOPHAGOGASTRODUODENOSCOPY (EGD);  Surgeon: Iva Boop, MD;  [facility] ENDOSCOPY;  Service: Endoscopy;  Laterality: N/A;   I & D EXTREMITY Right 12/14/2013   Procedure: IRRIGATION AND DEBRIDEMENT  RIGHT FOOT;  Surgeon: Verlee Rossetti, MD;  Location: [facility] OR;  Service: Orthopedics;  Laterality: Right;   INCISION AND DRAINAGE ABSCESS  2007; 2015   "back"   INCISION AND DRAINAGE OF WOUND Right 12/22/2013   Procedure: I&D RIGHT BUTTOCK;  Surgeon: Toni Arthurs, MD;  Location[facility] OR;  Service: Orthopedics;  Laterality: Right;   LEFT HEART CATHETERIZATION WITH CORONARY ANGIOGRAM N/A 02/18/2013   Procedure: LEFT HEART CATHETERIZATION WITH CORONARY ANGIOGRAM;  Surgeon: Peter M Swaziland, MD;  Location: [facility] CATH LAB;  Service: Cardiovascular;  Laterality: N/A;   Social History   Occupational History   Occupation: Consulting civil engineer  Comment: [employer]   Occupation: disability  Tobacco Use   Smoking status: Never   Smokeless tobacco: Never  Vaping Use   Vaping status: Never Used  Substance and Sexual Activity   Alcohol use: Yes    Comment: 10/31/2016 "a few beers q 6 months or so"   Drug use: No   Sexual activity: Yes

## 2022-09-30 DIAGNOSIS — E119 Type 2 diabetes mellitus without complications: Secondary | ICD-10-CM | POA: Diagnosis not present

## 2022-10-02 ENCOUNTER — Other Ambulatory Visit: Payer: Self-pay

## 2022-10-02 DIAGNOSIS — E113311 Type 2 diabetes mellitus with moderate nonproliferative diabetic retinopathy with macular edema, right eye: Secondary | ICD-10-CM | POA: Diagnosis not present

## 2022-10-02 MED ORDER — BD PEN NEEDLE MINI U/F 31G X 5 MM MISC: 0 refills | Status: DC

## 2022-10-06 ENCOUNTER — Other Ambulatory Visit: Payer: Self-pay

## 2022-10-06 MED ORDER — OXYBUTYNIN CHLORIDE 5 MG PO TABS: 5.0000 mg | ORAL_TABLET | Freq: Three times a day (TID) | ORAL | 3 refills | Status: DC

## 2022-10-11 ENCOUNTER — Encounter (HOSPITAL_COMMUNITY): Payer: Medicare HMO

## 2022-10-13 ENCOUNTER — Encounter (HOSPITAL_COMMUNITY)
Admission: RE | Admit: 2022-10-13 | Discharge: 2022-10-13 | Disposition: A | Discharge status: Home / Self Care | Payer: Medicare HMO | Source: Ambulatory Visit | Attending: Nephrology | Admitting: Nephrology

## 2022-10-13 ENCOUNTER — Encounter (HOSPITAL_COMMUNITY): Payer: Medicare HMO

## 2022-10-13 VITALS — BP 149/79 | HR 90 | Temp 97.1°F | Resp 18

## 2022-10-13 DIAGNOSIS — N183 Chronic kidney disease, stage 3 unspecified: Secondary | ICD-10-CM | POA: Insufficient documentation

## 2022-10-13 LAB — RENAL FUNCTION PANEL
Albumin: 3.5 g/dL (ref 3.5–5.0)
Anion gap: 12 (ref 5–15)
BUN: 73 mg/dL — ABNORMAL HIGH (ref 6–20)
CO2: 24 mmol/L (ref 22–32)
Calcium: 8.8 mg/dL — ABNORMAL LOW (ref 8.9–10.3)
Chloride: 99 mmol/L (ref 98–111)
Creatinine, Ser: 3.8 mg/dL — ABNORMAL HIGH (ref 0.61–1.24)
GFR, Estimated: 19 mL/min — ABNORMAL LOW (ref >60)
Glucose, Bld: 76 mg/dL (ref 70–99)
Phosphorus: 4.2 mg/dL (ref 2.5–4.6)
Potassium: 3.5 mmol/L (ref 3.5–5.1)
Sodium: 135 mmol/L (ref 135–145)

## 2022-10-13 LAB — IRON AND TIBC
Iron: 46 ug/dL (ref 45–182)
Saturation Ratios: 16 % — ABNORMAL LOW (ref 17.9–39.5)
TIBC: 294 ug/dL (ref 250–450)
UIBC: 248 ug/dL

## 2022-10-13 LAB — POCT HEMOGLOBIN-HEMACUE: Hemoglobin: 11.1 g/dL — ABNORMAL LOW (ref 13.0–17.0)

## 2022-10-13 LAB — FERRITIN: Ferritin: 378 ng/mL — ABNORMAL HIGH (ref 24–336)

## 2022-10-13 MED ORDER — DARBEPOETIN ALFA 200 MCG/0.4ML IJ SOSY
PREFILLED_SYRINGE | INTRAMUSCULAR | Status: AC
  Filled 2022-10-13: qty 0.4

## 2022-10-13 MED ORDER — DARBEPOETIN ALFA 200 MCG/0.4ML IJ SOSY
200.0000 ug | PREFILLED_SYRINGE | INTRAMUSCULAR | Status: DC
  Administered 2022-10-13: 200 ug via SUBCUTANEOUS

## 2022-10-16 DIAGNOSIS — N2581 Secondary hyperparathyroidism of renal origin: Secondary | ICD-10-CM | POA: Diagnosis not present

## 2022-10-16 DIAGNOSIS — N184 Chronic kidney disease, stage 4 (severe): Secondary | ICD-10-CM | POA: Diagnosis not present

## 2022-10-16 DIAGNOSIS — I5032 Chronic diastolic (congestive) heart failure: Secondary | ICD-10-CM | POA: Diagnosis not present

## 2022-10-16 DIAGNOSIS — Z794 Long term (current) use of insulin: Secondary | ICD-10-CM | POA: Diagnosis not present

## 2022-10-16 DIAGNOSIS — D631 Anemia in chronic kidney disease: Secondary | ICD-10-CM | POA: Diagnosis not present

## 2022-10-16 DIAGNOSIS — I130 Hypertensive heart and chronic kidney disease with heart failure and stage 1 through stage 4 chronic kidney disease, or unspecified chronic kidney disease: Secondary | ICD-10-CM | POA: Diagnosis not present

## 2022-10-16 DIAGNOSIS — E1122 Type 2 diabetes mellitus with diabetic chronic kidney disease: Secondary | ICD-10-CM | POA: Diagnosis not present

## 2022-10-16 DIAGNOSIS — Z23 Encounter for immunization: Secondary | ICD-10-CM | POA: Diagnosis not present

## 2022-10-16 DIAGNOSIS — E876 Hypokalemia: Secondary | ICD-10-CM | POA: Diagnosis not present

## 2022-10-18 DIAGNOSIS — E113312 Type 2 diabetes mellitus with moderate nonproliferative diabetic retinopathy with macular edema, left eye: Secondary | ICD-10-CM | POA: Diagnosis not present

## 2022-10-29 ENCOUNTER — Other Ambulatory Visit: Payer: Self-pay | Admitting: Internal Medicine

## 2022-10-29 ENCOUNTER — Other Ambulatory Visit: Payer: Self-pay | Admitting: Student

## 2022-10-29 DIAGNOSIS — E119 Type 2 diabetes mellitus without complications: Secondary | ICD-10-CM

## 2022-10-29 DIAGNOSIS — I5032 Chronic diastolic (congestive) heart failure: Secondary | ICD-10-CM

## 2022-10-30 DIAGNOSIS — E119 Type 2 diabetes mellitus without complications: Secondary | ICD-10-CM | POA: Diagnosis not present

## 2022-11-01 ENCOUNTER — Encounter (HOSPITAL_COMMUNITY): Payer: Medicare HMO

## 2022-11-03 ENCOUNTER — Encounter (HOSPITAL_COMMUNITY)
Admission: RE | Admit: 2022-11-03 | Discharge: 2022-11-03 | Disposition: A | Discharge status: Home / Self Care | Payer: Medicare HMO | Source: Ambulatory Visit | Attending: Nephrology | Admitting: Nephrology

## 2022-11-03 VITALS — BP 138/68 | HR 87 | Temp 97.2°F | Resp 17

## 2022-11-03 DIAGNOSIS — N183 Chronic kidney disease, stage 3 unspecified: Secondary | ICD-10-CM

## 2022-11-03 LAB — RENAL FUNCTION PANEL
Albumin: 3.5 g/dL (ref 3.5–5.0)
Anion gap: 11 (ref 5–15)
BUN: 75 mg/dL — ABNORMAL HIGH (ref 6–20)
CO2: 24 mmol/L (ref 22–32)
Calcium: 9.2 mg/dL (ref 8.9–10.3)
Chloride: 103 mmol/L (ref 98–111)
Creatinine, Ser: 3.47 mg/dL — ABNORMAL HIGH (ref 0.61–1.24)
GFR, Estimated: 21 mL/min — ABNORMAL LOW (ref >60)
Glucose, Bld: 86 mg/dL (ref 70–99)
Phosphorus: 3.9 mg/dL (ref 2.5–4.6)
Potassium: 3.8 mmol/L (ref 3.5–5.1)
Sodium: 138 mmol/L (ref 135–145)

## 2022-11-03 LAB — POCT HEMOGLOBIN-HEMACUE: Hemoglobin: 10.6 g/dL — ABNORMAL LOW (ref 13.0–17.0)

## 2022-11-03 LAB — IRON AND TIBC
Iron: 40 ug/dL — ABNORMAL LOW (ref 45–182)
Saturation Ratios: 14 % — ABNORMAL LOW (ref 17.9–39.5)
TIBC: 288 ug/dL (ref 250–450)
UIBC: 248 ug/dL

## 2022-11-03 LAB — FERRITIN: Ferritin: 400 ng/mL — ABNORMAL HIGH (ref 24–336)

## 2022-11-03 MED ORDER — DARBEPOETIN ALFA 200 MCG/0.4ML IJ SOSY
PREFILLED_SYRINGE | INTRAMUSCULAR | Status: AC
  Filled 2022-11-03: qty 0.4

## 2022-11-03 MED ORDER — DARBEPOETIN ALFA 200 MCG/0.4ML IJ SOSY
200.0000 ug | PREFILLED_SYRINGE | INTRAMUSCULAR | Status: DC
  Administered 2022-11-03: 200 ug via SUBCUTANEOUS

## 2022-11-06 LAB — PTH, INTACT AND CALCIUM
Calcium, Total (PTH): 9.2 mg/dL (ref 8.7–10.2)
PTH: 80 pg/mL — ABNORMAL HIGH (ref 15–65)

## 2022-11-20 DIAGNOSIS — E1122 Type 2 diabetes mellitus with diabetic chronic kidney disease: Secondary | ICD-10-CM | POA: Diagnosis not present

## 2022-11-20 DIAGNOSIS — E876 Hypokalemia: Secondary | ICD-10-CM | POA: Diagnosis not present

## 2022-11-20 DIAGNOSIS — N2581 Secondary hyperparathyroidism of renal origin: Secondary | ICD-10-CM | POA: Diagnosis not present

## 2022-11-20 DIAGNOSIS — I5032 Chronic diastolic (congestive) heart failure: Secondary | ICD-10-CM | POA: Diagnosis not present

## 2022-11-20 DIAGNOSIS — D631 Anemia in chronic kidney disease: Secondary | ICD-10-CM | POA: Diagnosis not present

## 2022-11-20 DIAGNOSIS — Z794 Long term (current) use of insulin: Secondary | ICD-10-CM | POA: Diagnosis not present

## 2022-11-20 DIAGNOSIS — I130 Hypertensive heart and chronic kidney disease with heart failure and stage 1 through stage 4 chronic kidney disease, or unspecified chronic kidney disease: Secondary | ICD-10-CM | POA: Diagnosis not present

## 2022-11-20 DIAGNOSIS — N184 Chronic kidney disease, stage 4 (severe): Secondary | ICD-10-CM | POA: Diagnosis not present

## 2022-11-24 ENCOUNTER — Ambulatory Visit (HOSPITAL_COMMUNITY)
Admission: RE | Admit: 2022-11-24 | Discharge: 2022-11-24 | Disposition: A | Discharge status: Home / Self Care | Payer: Medicare HMO | Source: Ambulatory Visit | Attending: Nephrology | Admitting: Nephrology

## 2022-11-24 VITALS — BP 142/75 | HR 87 | Temp 97.6°F | Resp 17

## 2022-11-24 DIAGNOSIS — N183 Chronic kidney disease, stage 3 unspecified: Secondary | ICD-10-CM | POA: Insufficient documentation

## 2022-11-24 LAB — IRON AND TIBC
Iron: 50 ug/dL (ref 45–182)
Saturation Ratios: 18 % (ref 17.9–39.5)
TIBC: 283 ug/dL (ref 250–450)
UIBC: 233 ug/dL

## 2022-11-24 LAB — RENAL FUNCTION PANEL
Albumin: 3.5 g/dL (ref 3.5–5.0)
Anion gap: 13 (ref 5–15)
BUN: 71 mg/dL — ABNORMAL HIGH (ref 6–20)
CO2: 23 mmol/L (ref 22–32)
Calcium: 9.3 mg/dL (ref 8.9–10.3)
Chloride: 100 mmol/L (ref 98–111)
Creatinine, Ser: 3.58 mg/dL — ABNORMAL HIGH (ref 0.61–1.24)
GFR, Estimated: 20 mL/min — ABNORMAL LOW (ref >60)
Glucose, Bld: 87 mg/dL (ref 70–99)
Phosphorus: 3.9 mg/dL (ref 2.5–4.6)
Potassium: 3.6 mmol/L (ref 3.5–5.1)
Sodium: 136 mmol/L (ref 135–145)

## 2022-11-24 LAB — POCT HEMOGLOBIN-HEMACUE: Hemoglobin: 11.3 g/dL — ABNORMAL LOW (ref 13.0–17.0)

## 2022-11-24 LAB — FERRITIN: Ferritin: 368 ng/mL — ABNORMAL HIGH (ref 24–336)

## 2022-11-24 MED ORDER — DARBEPOETIN ALFA 200 MCG/0.4ML IJ SOSY
PREFILLED_SYRINGE | INTRAMUSCULAR | Status: AC
  Administered 2022-11-24: 200 ug via SUBCUTANEOUS
  Filled 2022-11-24: qty 0.4

## 2022-11-24 MED ORDER — DARBEPOETIN ALFA 200 MCG/0.4ML IJ SOSY: 200.0000 ug | PREFILLED_SYRINGE | INTRAMUSCULAR | Status: DC

## 2022-11-27 DIAGNOSIS — E113311 Type 2 diabetes mellitus with moderate nonproliferative diabetic retinopathy with macular edema, right eye: Secondary | ICD-10-CM | POA: Diagnosis not present

## 2022-11-27 LAB — HM DIABETES EYE EXAM

## 2022-12-01 DIAGNOSIS — E119 Type 2 diabetes mellitus without complications: Secondary | ICD-10-CM | POA: Diagnosis not present

## 2022-12-03 ENCOUNTER — Other Ambulatory Visit: Payer: Self-pay | Admitting: Internal Medicine

## 2022-12-03 ENCOUNTER — Other Ambulatory Visit: Payer: Self-pay | Admitting: Student

## 2022-12-03 DIAGNOSIS — I5032 Chronic diastolic (congestive) heart failure: Secondary | ICD-10-CM

## 2022-12-03 DIAGNOSIS — E785 Hyperlipidemia, unspecified: Secondary | ICD-10-CM

## 2022-12-03 DIAGNOSIS — E119 Type 2 diabetes mellitus without complications: Secondary | ICD-10-CM

## 2022-12-06 ENCOUNTER — Encounter: Payer: Self-pay | Admitting: Endocrinology

## 2022-12-06 ENCOUNTER — Ambulatory Visit: Payer: Medicare HMO | Admitting: Endocrinology

## 2022-12-06 VITALS — BP 122/78 | HR 88 | Resp 20 | Ht 74.0 in | Wt 302.0 lb

## 2022-12-06 DIAGNOSIS — E119 Type 2 diabetes mellitus without complications: Secondary | ICD-10-CM | POA: Diagnosis not present

## 2022-12-06 DIAGNOSIS — Z794 Long term (current) use of insulin: Secondary | ICD-10-CM | POA: Diagnosis not present

## 2022-12-06 LAB — POCT GLYCOSYLATED HEMOGLOBIN (HGB A1C): Hemoglobin A1C: 5.2 % (ref 4.0–5.6)

## 2022-12-06 MED ORDER — NOVOLOG FLEXPEN 100 UNIT/ML ~~LOC~~ SOPN: 0.0000 [IU] | PEN_INJECTOR | Freq: Three times a day (TID) | SUBCUTANEOUS | 4 refills | Status: DC

## 2022-12-06 MED ORDER — TRESIBA FLEXTOUCH 200 UNIT/ML ~~LOC~~ SOPN: PEN_INJECTOR | SUBCUTANEOUS | 3 refills | Status: DC

## 2022-12-06 NOTE — Progress Notes (Signed)
Outpatient Endocrinology Note [place] Turrell Severt, MD  12/06/22  Patient's Name: [name]    DOB: 11/19/1975    MRN: [id number]                                                    REASON OF VISIT: Follow up for type 2 diabetes mellitus  PCP: Kathleen Lime, MD  HISTORY OF PRESENT ILLNESS:   Rustyn Charnley is a 47 y.o. old male with past medical history listed below, is here for follow up of type 2 diabetes mellitus.   Pertinent Diabetes History: Patient was diagnosed with type 2 diabetes mellitus in 2001.  Weight was 400 pounds at the time of diagnosis.  He was restarted on insulin therapy initially.  He was not able to tolerate metformin due to diarrhea.  Chronic Diabetes Complications : Retinopathy: Unknown.  Reports following with retina specialist however denies diabetes retinopathy. Nephropathy: CKD IV following with nephrology.  Microalbuminuria present. Peripheral neuropathy: Diabetic foot ulcers, status post right AKA 2015/2016. Coronary artery disease: no Stroke: no  Relevant comorbidities and cardiovascular risk factors: Obesity: yes Body mass index is 38.77 kg/m.  Hypertension: yes Hyperlipidemia. Yes, on a statin.  Current / Home Diabetic regimen includes: Tresiba 42 units daily. NovoLog 0 units breakfast 0-4 units before lunch and 0-4 units at dinner. Mounjaro 15 mg weekly.  Prior diabetic medications: Metformin stopped due to diarrhea. Ozempic had vomiting.  Glycemic data:   Not able to download in the clinic today.  Not able to review glucose data.  Patient reports he is having blood sugar in the 60s occasionally in the morning especially after exercise.  Hypoglycemia: Patient has hypoglycemic episodes. Patient has hypoglycemia awareness.  Factors modifying glucose control: 1.  Diabetic diet assessment: 2 meals a day.  2.  Staying active or exercising: Workout a mostly every day 5-6 times a week in the morning.  3.  Medication compliance: compliant all of  the time.  Interval history  Diabetes regimen as above.  He reports he has been requiring significantly less NovoLog.  No GI issues regarding Mounjaro.  He reports blood sugar in 60s occasionally especially after exercise in the morning.  No other complaints today.  REVIEW OF SYSTEMS As per history of present illness.   PAST MEDICAL HISTORY: Past Medical History:  Diagnosis Date   Acute venous embolism and thrombosis of deep vessels of proximal lower extremity (HCC) 07/19/2011   Anemia    Anginal pain (HCC)    pt denies   Anxiety    Chest pain, neg MI, normal coronaries by cath 02/18/2013   pt denies   CHF (congestive heart failure) (HCC)    Chronic renal disease, stage IV (HCC) 02/19/2013   Colon polyps    Depression    Diabetic ulcer of right foot (HCC)    DVT (deep venous thrombosis) (HCC) 09/2002   patient reports additional DVTs in '06 & '11 (unconfirmed)   ED (erectile dysfunction)    GERD (gastroesophageal reflux disease)    History of blood transfusion    "related to OR" (10/31/2016)   Hyperlipidemia 02/19/2013   Hypertension    Nausea & vomiting    "constant for the last couple weeks" (10/31/2016)   Nephrotic syndrome    Obesity    BMI 44, weight 346 pounds 01/30/14   OSA (obstructive sleep  apnea) 02/28/2018   Mild obstructive sleep apnea with an AHI of 9.8/h but severe during REM sleep with an AHI of 33.8/h.  Oxygen saturations dropped to 86% and there was moderate snoring   Peripheral edema    Pneumonia    Prosthesis adjustment 08/17/2016   Pulmonary embolism (HCC) 09/2002   treated with 6 months of warfarin   Pyelonephritis 02/02/2018   Type I diabetes mellitus (HCC) dx'd 2001   Urinary retention     PAST SURGICAL HISTORY: Past Surgical History:  Procedure Laterality Date   AMPUTATION Right 12/22/2013   Procedure: AMPUTATION BELOW KNEE;  Surgeon: Toni Arthurs, MD;  Location: [facility] OR;  Service: Orthopedics;  Laterality: Right;   AMPUTATION Right 02/19/2014    Procedure: RIGHT ABOVE KNEE AMPUTATION ;  Surgeon: Toni Arthurs, MD;  Location: [facility] OR;  Service: Orthopedics;  Laterality: Right;   blood clot removal  2016   CARDIAC CATHETERIZATION  02/18/2013   normal coronaries   ESOPHAGOGASTRODUODENOSCOPY N/A 11/02/2016   Procedure: ESOPHAGOGASTRODUODENOSCOPY (EGD);  Surgeon: Iva Boop, MD;  Location: [facility] ENDOSCOPY;  Service: Endoscopy;  Laterality: N/A;   I & D EXTREMITY Right 12/14/2013   Procedure: IRRIGATION AND DEBRIDEMENT RIGHT FOOT;  Surgeon: Verlee Rossetti, MD;  Location: [facility] OR;  Service: Orthopedics;  Laterality: Right;   INCISION AND DRAINAGE ABSCESS  2007; 2015   "back"   INCISION AND DRAINAGE OF WOUND Right 12/22/2013   Procedure: I&D RIGHT BUTTOCK;  Surgeon: Toni Arthurs, MD;  Location: [facility] OR;  Service: Orthopedics;  Laterality: Right;   LEFT HEART CATHETERIZATION WITH CORONARY ANGIOGRAM N/A 02/18/2013   Procedure: LEFT HEART CATHETERIZATION WITH CORONARY ANGIOGRAM;  Surgeon: Peter M Swaziland, MD;  Location: [facility] CATH LAB;  Service: Cardiovascular;  Laterality: N/A;    ALLERGIES: Allergies  Allergen Reactions   Reglan [Metoclopramide] Other (See Comments)    Dysphoric reaction   Metformin And Related Diarrhea   Ozempic (0.25 Or 0.5 Mg-Dose) [Semaglutide(0.25 Or 0.5mg -Dos)] Nausea And Vomiting    FAMILY HISTORY:  Family History  Problem Relation Age of Onset   Heart disease Mother    Diabetes Mother    Diabetes Maternal Uncle    Diabetes Cousin     SOCIAL HISTORY: Social History   Socioeconomic History   Marital status: Single    Spouse name: Not on file   Number of children: 0   Years of education: 15.5   Highest education level: Some college, no degree  Occupational History   Occupation: Consulting civil engineer    Comment: [employer]   Occupation: disability  Tobacco Use   Smoking status: Never   Smokeless tobacco: Never  Vaping Use   Vaping status: Never Used  Substance and Sexual Activity   Alcohol use: Yes    Comment: 10/31/2016  "a few beers q 6 months or so"   Drug use: No   Sexual activity: Yes  Other Topics Concern   Not on file  Social History Narrative   Financial assistance approved for 100% discount at [facility] and has [facility] card per [employer]   09/01/2009.      Lives in [place] with mother and sister.            Social Determinants of Health   Financial Resource Strain: Medium Risk (10/01/2020)   Overall Financial Resource Strain (CARDIA)    Difficulty of Paying Living Expenses: Somewhat hard  Food Insecurity: No Food Insecurity (09/07/2022)   Hunger Vital Sign    Worried About Running Out of Food in  the Last Year: Never true    Ran Out of Food in the Last Year: Never true  Transportation Needs: No Transportation Needs (09/07/2022)   PRAPARE - Administrator, Civil Service (Medical): No    Lack of Transportation (Non-Medical): No  Physical Activity: Sufficiently Active (08/20/2019)   Exercise Vital Sign    Days of Exercise per Week: 6 days    Minutes of Exercise per Session: 60 min  Stress: No Stress Concern Present (08/20/2019)   [employer] of Occupational Health - Occupational Stress Questionnaire    Feeling of Stress : Not at all  Social Connections: Socially Isolated (12/22/2021)   Social Connection and Isolation Panel [NHANES]    Frequency of Communication with Friends and Family: More than three times a week    Frequency of Social Gatherings with Friends and Family: More than three times a week    Attends Religious Services: Never    Database administrator or Organizations: No    Attends Banker Meetings: Never    Marital Status: Never married    MEDICATIONS:  Current Outpatient Medications  Medication Sig Dispense Refill   acidophilus (RISAQUAD) CAPS capsule Take 1 capsule by mouth daily.     Alpha-Lipoic Acid 600 MG CAPS Take 600 mg by mouth daily.     amLODipine (NORVASC) 10 MG tablet Take 10 mg by mouth at bedtime.     Blood Glucose Monitoring  Suppl (ACCU-CHEK GUIDE ME) w/Device KIT Use to check blood sugar up to 3 times a day 1 kit 1   carvedilol (COREG) 25 MG tablet TAKE 1 TABLET BY MOUTH TWICE DAILY WITH A MEAL 180 tablet 0   chlorthalidone (HYGROTON) 25 MG tablet Take 25 mg by mouth daily.     cloNIDine (CATAPRES - DOSED IN MG/24 HR) 0.1 mg/24hr patch 0.1 mg once a week.     Cyanocobalamin (B-12) 500 MCG SUBL Place 500 mcg under the tongue daily.      diclofenac Sodium (VOLTAREN) 1 % GEL Apply 2 g topically 4 (four) times daily. 100 g 3   ELIQUIS 5 MG TABS tablet Take 1 tablet by mouth twice daily 180 tablet 0   famotidine (PEPCID) 20 MG tablet Take 1 tablet (20 mg total) by mouth 2 (two) times daily. (Patient taking differently: Take 20 mg by mouth 2 (two) times daily as needed for heartburn.) 30 tablet 0   furosemide (LASIX) 80 MG tablet Take 1 tablet by mouth twice daily (Patient taking differently: Take 80 mg by mouth 2 (two) times daily.) 180 tablet 0   gabapentin (NEURONTIN) 100 MG capsule TAKE 1 CAPSULE BY MOUTH THREE TIMES DAILY 270 capsule 0   Glucagon (GVOKE HYPOPEN 1-PACK) 1 MG/0.2ML SOAJ Inject 1 mg into the skin as needed (low blood sugar with impaired consciousness). 0.4 mL 2   glucose blood (ACCU-CHEK GUIDE) test strip Check blood sugar up to 3 times a day 300 each 3   hydrALAZINE (APRESOLINE) 100 MG tablet Take 100 mg by mouth 3 (three) times daily.     Insulin Pen Needle (B-D UF III MINI PEN NEEDLES) 31G X 5 MM MISC 1 each by Other route See admin instructions. USE 4-5 TIMES DAILY 200 each 0   Insulin Pen Needle (B-D UF III MINI PEN NEEDLES) 31G X 5 MM MISC USE 1  NEEDLE FIVE TIMES DAILY 400 each 0   Lancet Devices (ACCU-CHEK SOFTCLIX) lancets Use to check blood sugars 5 times a day. Dx code:E11.9.  Insulin dependent. (Patient taking differently: 1 each by Other route See admin instructions. Use to check blood sugars 5 times a day. Dx code:E11.9. Insulin dependent.) 6 each 3   lidocaine (HM LIDOCAINE PATCH) 4 % Place 1  patch onto the skin daily. 5 patch 0   lovastatin (MEVACOR) 20 MG tablet Take 1 tablet by mouth once daily 90 tablet 0   methocarbamol (ROBAXIN) 500 MG tablet Take 1 tablet (500 mg total) by mouth at bedtime as needed for muscle spasms. 15 tablet 0   oxybutynin (DITROPAN) 5 MG tablet Take 1 tablet (5 mg total) by mouth 3 (three) times daily. 90 tablet 3   polyethylene glycol (MIRALAX / GLYCOLAX) 17 g packet Take 17 g by mouth 2 (two) times daily. (Patient taking differently: Take 17 g by mouth as needed for mild constipation.) 14 each 0   potassium chloride SA (KLOR-CON) 20 MEQ tablet Take 20 mEq by mouth 2 (two) times daily.     senna-docusate (SENOKOT-S) 8.6-50 MG tablet Take 1 tablet by mouth daily. (Patient taking differently: Take 1 tablet by mouth as needed for mild constipation.) 90 tablet 3   sildenafil (VIAGRA) 50 MG tablet Take 1-2 tablets (50-100 mg total) by mouth as needed for erectile dysfunction. Take 1 hour before needed. Do not take more than 2 tablets at once. 20 tablet 0   tamsulosin (FLOMAX) 0.4 MG CAPS capsule Take 1 capsule by mouth at bedtime 90 capsule 0   tirzepatide (MOUNJARO) 15 MG/0.5ML Pen Inject 15 mg into the skin once a week. 6 mL 4   fluticasone (FLONASE) 50 MCG/ACT nasal spray Place 2 sprays into both nostrils daily. (Patient taking differently: Place 2 sprays into both nostrils daily as needed for allergies.) 15.8 mL 2   insulin degludec (TRESIBA FLEXTOUCH) 200 UNIT/ML FlexTouch Pen INJECT 34 UNITS SUBCUTANEOUSLY ONCE DAILY 30 mL 3   NOVOLOG FLEXPEN 100 UNIT/ML FlexPen Inject 0-4 Units into the skin 3 (three) times daily with meals. Administer 0 units in the morning with breakfast, 0-4 units with lunch, and 0-4 units with dinner daily, maximum 30 units/day 30 mL 4   No current facility-administered medications for this visit.    PHYSICAL EXAM: Vitals:   12/06/22 0934  BP: 122/78  Pulse: 88  Resp: 20  SpO2: 96%  Weight: (!) 302 lb (137 kg)  Height: 6\' 2"   (1.88 m)    Body mass index is 38.77 kg/m.  Wt Readings from Last 3 Encounters:  12/06/22 (!) 302 lb (137 kg)  08/04/22 (!) 343 lb 8 oz (155.8 kg)  05/11/22 (!) 328 lb (148.8 kg)    General: Well developed, well nourished male in no apparent distress.  HEENT: AT/[place], no external lesions.  Eyes: Conjunctiva clear and no icterus. Neck: Neck supple  Lungs: Respirations not labored Neurologic: Alert, oriented, normal speech Extremities / Skin: Dry. Rt AKA. Psychiatric: Does not appear depressed or anxious  Diabetic Foot Exam - Simple   Simple Foot Form Diabetic Foot exam was performed with the following findings: Yes 12/06/2022  9:55 AM  Visual Inspection See comments: Yes Sensation Testing See comments: Yes Pulse Check See comments: Yes Comments Right above knee amputation. Left foot : dry, no deformity, no ulcer. Monofilament significantly decreased. DP 2+.     LABS Reviewed Lab Results  Component Value Date   HGBA1C 5.2 12/06/2022   HGBA1C 5.0 08/28/2022   HGBA1C 5.2 05/09/2022   Lab Results  Component Value Date   FRUCTOSAMINE 271 08/28/2022  FRUCTOSAMINE 299 (H) 05/09/2022   FRUCTOSAMINE 294 (H) 08/23/2021   Lab Results  Component Value Date   CHOL 119 12/30/2021   HDL 38.10 (L) 12/30/2021   LDLCALC 60 12/30/2021   TRIG 104.0 12/30/2021   CHOLHDL 3 12/30/2021   Lab Results  Component Value Date   MICRALBCREAT 1,528.4 (H) 02/15/2017   MICRALBCREAT 818.2 (H) 08/14/2013   Lab Results  Component Value Date   CREATININE 3.58 (H) 11/24/2022   Lab Results  Component Value Date   GFR 28.51 (L) 04/15/2020    ASSESSMENT / PLAN  1. Insulin dependent type 2 diabetes mellitus, controlled (HCC)      Diabetes Mellitus type 2, complicated by peripheral neuropathy status post right BKA, CKD. - Diabetic status / severity: Controlled  Lab Results  Component Value Date   HGBA1C 5.2 12/06/2022    - Hemoglobin A1c goal : <7%  Not able to review CGM data  in the clinic today.  Not able to download.  Possibly problem with the [facility] 2 reader, patient will contact freestyle to get a replacement.  Reported hypoglycemia in the early morning.  Hemoglobin A1c today 5.2%, possibly mildly falsely low due to anemia.  - Medications: Adjusted as follows.  Decrease tresiba to 35 units daily.   Continue Mounjaro 15 mg weekly.  Novolog 0-4 units with meals 2 times a day , may adjust based on meals and experience.  No NovoLog with breakfast.  Asked to call the clinic if he continues to have hypoglycemia after the changes we made today,  probably need to further decrease the dose of insulin.  - Home glucose testing: Continue CGM and check as needed. - Discussed/ Gave Hypoglycemia treatment plan.  He has Glucagon Emergency Kit prescribed.  Advised to use glucose tablet for correction of hypoglycemia.  # Consult : not required at this time.   # He has diabetic nephropathy, following with nephrology.   Last  Lab Results  Component Value Date   MICRALBCREAT 1,528.4 (H) 02/15/2017    # Foot check nightly, history of diabetic foot ulcer status post right AKA.  # Annual dilated diabetic eye exams.  Following with retina specialist, status post retinopathy unknown at this time.  - Diet: Eat reasonable portion sizes to promote a healthy weight - Life style / activity / exercise: Discussed.  He regularly workout 5-6 times a week.  2. Blood pressure  -  BP Readings from Last 1 Encounters:  12/06/22 122/78    - Control is in target.  - No change in current plans.  3. Lipid status / Hyperlipidemia - Last  Lab Results  Component Value Date   LDLCALC 60 12/30/2021   - Continue lovastatin 20 mg daily.  Diagnoses and all orders for this visit:  Insulin dependent type 2 diabetes mellitus, controlled (HCC) -     POCT glycosylated hemoglobin (Hb A1C)  Other orders -     insulin degludec (TRESIBA FLEXTOUCH) 200 UNIT/ML FlexTouch Pen; INJECT 34 UNITS  SUBCUTANEOUSLY ONCE DAILY -     NOVOLOG FLEXPEN 100 UNIT/ML FlexPen; Inject 0-4 Units into the skin 3 (three) times daily with meals. Administer 0 units in the morning with breakfast, 0-4 units with lunch, and 0-4 units with dinner daily, maximum 30 units/day     DISPOSITION Follow up in clinic in 6 weeks suggested.   All questions answered and patient verbalized understanding of the plan.  Iraq Soo Steelman, MD [facility] Endocrinology [facility] [address] [place], [address] [place], [place] [address] Phone # [phone]  At least part of this note was generated using voice recognition software. Inadvertent word errors may have occurred, which were not recognized during the proofreading process.

## 2022-12-06 NOTE — Patient Instructions (Signed)
Diabetes regimen: Decrease tresiba to 35 units daily.   Mounjaro 15 mg weekly.  Novolog 0-4 units with meals 2 times a day , may adjust based on meals and experience.

## 2022-12-15 ENCOUNTER — Encounter (HOSPITAL_COMMUNITY)
Admission: RE | Admit: 2022-12-15 | Discharge: 2022-12-15 | Disposition: A | Discharge status: Home / Self Care | Payer: Medicare HMO | Source: Ambulatory Visit | Attending: Nephrology | Admitting: Nephrology

## 2022-12-15 VITALS — BP 117/70 | HR 84 | Temp 97.2°F | Resp 18

## 2022-12-15 DIAGNOSIS — N183 Chronic kidney disease, stage 3 unspecified: Secondary | ICD-10-CM | POA: Diagnosis not present

## 2022-12-15 LAB — RENAL FUNCTION PANEL
Albumin: 3.6 g/dL (ref 3.5–5.0)
Anion gap: 12 (ref 5–15)
BUN: 83 mg/dL — ABNORMAL HIGH (ref 6–20)
CO2: 23 mmol/L (ref 22–32)
Calcium: 9.1 mg/dL (ref 8.9–10.3)
Chloride: 101 mmol/L (ref 98–111)
Creatinine, Ser: 3.56 mg/dL — ABNORMAL HIGH (ref 0.61–1.24)
GFR, Estimated: 20 mL/min — ABNORMAL LOW (ref >60)
Glucose, Bld: 76 mg/dL (ref 70–99)
Phosphorus: 4.3 mg/dL (ref 2.5–4.6)
Potassium: 3.6 mmol/L (ref 3.5–5.1)
Sodium: 136 mmol/L (ref 135–145)

## 2022-12-15 LAB — POCT HEMOGLOBIN-HEMACUE: Hemoglobin: 11.6 g/dL — ABNORMAL LOW (ref 13.0–17.0)

## 2022-12-15 LAB — IRON AND TIBC
Iron: 55 ug/dL (ref 45–182)
Saturation Ratios: 18 % (ref 17.9–39.5)
TIBC: 305 ug/dL (ref 250–450)
UIBC: 250 ug/dL

## 2022-12-15 LAB — FERRITIN: Ferritin: 328 ng/mL (ref 24–336)

## 2022-12-15 MED ORDER — DARBEPOETIN ALFA 200 MCG/0.4ML IJ SOSY
200.0000 ug | PREFILLED_SYRINGE | INTRAMUSCULAR | Status: DC
Start: 2022-12-15 — End: 2023-12-28
  Administered 2022-12-15: 200 ug via SUBCUTANEOUS

## 2022-12-15 MED ORDER — DARBEPOETIN ALFA 200 MCG/0.4ML IJ SOSY
PREFILLED_SYRINGE | INTRAMUSCULAR | Status: AC
  Filled 2022-12-15: qty 0.4

## 2022-12-21 DIAGNOSIS — E119 Type 2 diabetes mellitus without complications: Secondary | ICD-10-CM | POA: Diagnosis not present

## 2022-12-21 DIAGNOSIS — D631 Anemia in chronic kidney disease: Secondary | ICD-10-CM | POA: Diagnosis not present

## 2022-12-21 DIAGNOSIS — Z794 Long term (current) use of insulin: Secondary | ICD-10-CM | POA: Diagnosis not present

## 2022-12-21 DIAGNOSIS — E1122 Type 2 diabetes mellitus with diabetic chronic kidney disease: Secondary | ICD-10-CM | POA: Diagnosis not present

## 2022-12-21 DIAGNOSIS — N2581 Secondary hyperparathyroidism of renal origin: Secondary | ICD-10-CM | POA: Diagnosis not present

## 2022-12-21 DIAGNOSIS — E876 Hypokalemia: Secondary | ICD-10-CM | POA: Diagnosis not present

## 2022-12-21 DIAGNOSIS — I10 Essential (primary) hypertension: Secondary | ICD-10-CM | POA: Diagnosis not present

## 2022-12-21 DIAGNOSIS — I130 Hypertensive heart and chronic kidney disease with heart failure and stage 1 through stage 4 chronic kidney disease, or unspecified chronic kidney disease: Secondary | ICD-10-CM | POA: Diagnosis not present

## 2022-12-21 DIAGNOSIS — N184 Chronic kidney disease, stage 4 (severe): Secondary | ICD-10-CM | POA: Diagnosis not present

## 2022-12-25 ENCOUNTER — Other Ambulatory Visit: Payer: Self-pay | Admitting: Student

## 2022-12-27 DIAGNOSIS — E113212 Type 2 diabetes mellitus with mild nonproliferative diabetic retinopathy with macular edema, left eye: Secondary | ICD-10-CM | POA: Diagnosis not present

## 2022-12-27 LAB — HM DIABETES EYE EXAM

## 2022-12-31 DIAGNOSIS — E119 Type 2 diabetes mellitus without complications: Secondary | ICD-10-CM | POA: Diagnosis not present

## 2023-01-05 ENCOUNTER — Encounter (HOSPITAL_COMMUNITY): Payer: Medicare HMO

## 2023-01-17 ENCOUNTER — Encounter (HOSPITAL_COMMUNITY)
Admission: RE | Admit: 2023-01-17 | Discharge: 2023-01-17 | Disposition: A | Discharge status: Home / Self Care | Payer: Medicare HMO | Source: Ambulatory Visit | Attending: Nephrology | Admitting: Nephrology

## 2023-01-17 VITALS — BP 121/64 | HR 85 | Temp 97.4°F | Resp 17

## 2023-01-17 DIAGNOSIS — N183 Chronic kidney disease, stage 3 unspecified: Secondary | ICD-10-CM | POA: Diagnosis not present

## 2023-01-17 LAB — IRON AND TIBC
Iron: 55 ug/dL (ref 45–182)
Saturation Ratios: 22 % (ref 17.9–39.5)
TIBC: 248 ug/dL — ABNORMAL LOW (ref 250–450)
UIBC: 193 ug/dL

## 2023-01-17 LAB — RENAL FUNCTION PANEL
Albumin: 3.2 g/dL — ABNORMAL LOW (ref 3.5–5.0)
Anion gap: 13 (ref 5–15)
BUN: 92 mg/dL — ABNORMAL HIGH (ref 6–20)
CO2: 24 mmol/L (ref 22–32)
Calcium: 9.1 mg/dL (ref 8.9–10.3)
Chloride: 98 mmol/L (ref 98–111)
Creatinine, Ser: 3.71 mg/dL — ABNORMAL HIGH (ref 0.61–1.24)
GFR, Estimated: 19 mL/min — ABNORMAL LOW (ref >60)
Glucose, Bld: 126 mg/dL — ABNORMAL HIGH (ref 70–99)
Phosphorus: 3.8 mg/dL (ref 2.5–4.6)
Potassium: 3.7 mmol/L (ref 3.5–5.1)
Sodium: 135 mmol/L (ref 135–145)

## 2023-01-17 LAB — FERRITIN: Ferritin: 743 ng/mL — ABNORMAL HIGH (ref 24–336)

## 2023-01-17 LAB — POCT HEMOGLOBIN-HEMACUE: Hemoglobin: 10.6 g/dL — ABNORMAL LOW (ref 13.0–17.0)

## 2023-01-17 MED ORDER — DARBEPOETIN ALFA 200 MCG/0.4ML IJ SOSY
PREFILLED_SYRINGE | INTRAMUSCULAR | Status: AC
  Filled 2023-01-17: qty 0.4

## 2023-01-17 MED ORDER — DARBEPOETIN ALFA 200 MCG/0.4ML IJ SOSY
200.0000 ug | PREFILLED_SYRINGE | INTRAMUSCULAR | Status: DC
  Administered 2023-01-17: 200 ug via SUBCUTANEOUS

## 2023-01-24 ENCOUNTER — Ambulatory Visit: Payer: Medicare HMO | Admitting: Orthopaedic Surgery

## 2023-01-26 ENCOUNTER — Encounter (HOSPITAL_COMMUNITY): Payer: Medicare HMO

## 2023-01-26 DIAGNOSIS — I10 Essential (primary) hypertension: Secondary | ICD-10-CM | POA: Diagnosis not present

## 2023-01-26 DIAGNOSIS — E876 Hypokalemia: Secondary | ICD-10-CM | POA: Diagnosis not present

## 2023-01-26 DIAGNOSIS — N2581 Secondary hyperparathyroidism of renal origin: Secondary | ICD-10-CM | POA: Diagnosis not present

## 2023-01-26 DIAGNOSIS — D631 Anemia in chronic kidney disease: Secondary | ICD-10-CM | POA: Diagnosis not present

## 2023-01-26 DIAGNOSIS — Z794 Long term (current) use of insulin: Secondary | ICD-10-CM | POA: Diagnosis not present

## 2023-01-26 DIAGNOSIS — N184 Chronic kidney disease, stage 4 (severe): Secondary | ICD-10-CM | POA: Diagnosis not present

## 2023-01-26 DIAGNOSIS — I129 Hypertensive chronic kidney disease with stage 1 through stage 4 chronic kidney disease, or unspecified chronic kidney disease: Secondary | ICD-10-CM | POA: Diagnosis not present

## 2023-01-26 DIAGNOSIS — E1122 Type 2 diabetes mellitus with diabetic chronic kidney disease: Secondary | ICD-10-CM | POA: Diagnosis not present

## 2023-01-26 DIAGNOSIS — E119 Type 2 diabetes mellitus without complications: Secondary | ICD-10-CM | POA: Diagnosis not present

## 2023-01-27 ENCOUNTER — Other Ambulatory Visit: Payer: Self-pay | Admitting: Student

## 2023-01-27 ENCOUNTER — Encounter (HOSPITAL_COMMUNITY): Payer: Self-pay

## 2023-01-30 ENCOUNTER — Encounter: Payer: Self-pay | Admitting: Orthopaedic Surgery

## 2023-01-30 ENCOUNTER — Encounter (HOSPITAL_COMMUNITY): Payer: Self-pay

## 2023-01-30 ENCOUNTER — Ambulatory Visit (INDEPENDENT_AMBULATORY_CARE_PROVIDER_SITE_OTHER): Payer: Medicare HMO | Admitting: Orthopaedic Surgery

## 2023-01-30 ENCOUNTER — Other Ambulatory Visit (HOSPITAL_COMMUNITY): Payer: Self-pay

## 2023-01-30 DIAGNOSIS — M654 Radial styloid tenosynovitis [de Quervain]: Secondary | ICD-10-CM

## 2023-01-30 DIAGNOSIS — M7711 Lateral epicondylitis, right elbow: Secondary | ICD-10-CM | POA: Diagnosis not present

## 2023-01-30 DIAGNOSIS — E119 Type 2 diabetes mellitus without complications: Secondary | ICD-10-CM | POA: Diagnosis not present

## 2023-01-30 DIAGNOSIS — M7712 Lateral epicondylitis, left elbow: Secondary | ICD-10-CM | POA: Diagnosis not present

## 2023-01-30 MED ORDER — METHYLPREDNISOLONE ACETATE 40 MG/ML IJ SUSP
13.3300 mg | INTRAMUSCULAR | Status: AC | PRN
  Administered 2023-01-30: 13.33 mg

## 2023-01-30 MED ORDER — LIDOCAINE HCL 1 % IJ SOLN
0.3000 mL | INTRAMUSCULAR | Status: AC | PRN
  Administered 2023-01-30: .3 mL

## 2023-01-30 MED ORDER — METHYLPREDNISOLONE ACETATE 40 MG/ML IJ SUSP
40.0000 mg | INTRAMUSCULAR | Status: AC | PRN
  Administered 2023-01-30: 40 mg via INTRA_ARTICULAR

## 2023-01-30 MED ORDER — BUPIVACAINE HCL 0.5 % IJ SOLN
0.3300 mL | INTRAMUSCULAR | Status: AC | PRN
  Administered 2023-01-30: .33 mL

## 2023-01-30 MED ORDER — LIDOCAINE HCL 1 % IJ SOLN
1.0000 mL | INTRAMUSCULAR | Status: AC | PRN
  Administered 2023-01-30: 1 mL

## 2023-01-30 MED ORDER — BUPIVACAINE HCL 0.5 % IJ SOLN
1.0000 mL | INTRAMUSCULAR | Status: AC | PRN
  Administered 2023-01-30: 1 mL via INTRA_ARTICULAR

## 2023-01-30 NOTE — Progress Notes (Signed)
Office Visit Note   Patient: Lenon Vanleeuwen           Date of Birth: [birth year]           MRN: [id number] Visit Date: 01/30/2023              Requested by: Kathleen Lime, MD [address] [place],  [place] [address] PCP: Kathleen Lime, MD   Assessment & Plan: Visit Diagnoses:  1. De Quervain's tenosynovitis, right   2. Lateral epicondylitis, left elbow     Plan: Patient is a 48 year old gentleman with a right de Quervain's tenosynovitis and left lateral epicondylitis.  Treatment options were explained.  Cortisone injections were administered for both.  Thumb spica brace and counterforce brace provided.  Limit activity as needed.  Follow-up as needed.  Follow-Up Instructions: No follow-ups on file.   Orders:  Orders Placed This Encounter  Procedures   Hand/UE Inj   Medium Joint Inj   No orders of the defined types were placed in this encounter.     Procedures: Hand/UE Inj: R extensor compartment 1 for de Quervain's tenosynovitis on 01/30/2023 7:21 PM Indications: pain Details: 25 G needle Medications: 0.3 mL lidocaine 1 %; 0.33 mL bupivacaine 0.5 %; 13.33 mg methylPREDNISolone acetate 40 MG/ML Outcome: tolerated well, no immediate complications Patient was prepped and draped in the usual sterile fashion.    Medium Joint Inj: L lateral epicondyle on 01/30/2023 7:21 PM Indications: pain Details: 25 G needle Medications: 1 mL lidocaine 1 %; 1 mL bupivacaine 0.5 %; 40 mg methylPREDNISolone acetate 40 MG/ML Outcome: tolerated well, no immediate complications Patient was prepped and draped in the usual sterile fashion.       Clinical Data: No additional findings.   Subjective: Chief Complaint  Patient presents with   Left Elbow - Pain    HPI Patient comes in today for evaluation of right wrist and left elbow pain.  States that it bothers him when he is lifting weights at the gym.  He is changes exercise routine recently.  He is mainly a wheelchair ambulator.  Uses  his arms a lot for transfers. Review of Systems  Constitutional: Negative.   HENT: Negative.    Eyes: Negative.   Respiratory: Negative.    Cardiovascular: Negative.   Gastrointestinal: Negative.   Endocrine: Negative.   Genitourinary: Negative.   Skin: Negative.   Allergic/Immunologic: Negative.   Neurological: Negative.   Hematological: Negative.   Psychiatric/Behavioral: Negative.    All other systems reviewed and are negative.    Objective: Vital Signs: There were no vitals taken for this visit.  Physical Exam Vitals and nursing note reviewed.  Constitutional:      Appearance: He is well-developed.  HENT:     Head: Normocephalic and atraumatic.  Eyes:     Pupils: Pupils are equal, round, and reactive to light.  Pulmonary:     Effort: Pulmonary effort is normal.  Abdominal:     Palpations: Abdomen is soft.  Musculoskeletal:        General: Normal range of motion.     Cervical back: Neck supple.  Skin:    General: Skin is warm.  Neurological:     Mental Status: He is alert and oriented to person, place, and time.  Psychiatric:        Behavior: Behavior normal.        Thought Content: Thought content normal.        Judgment: Judgment normal.     Ortho Exam  Examination of the right wrist shows tenderness along the first wrist compartment.  Mildly positive Finkelstein's.  Examination of the left elbow shows tenderness to the lateral epicondyle.  No significant pain with resistance to wrist extension or ECRB extension. Specialty Comments:  No specialty comments available.  Imaging: No results found.   PMFS History: Patient Active Problem List   Diagnosis Date Noted   Elbow strain, left, initial encounter 06/06/2022   Medial epicondylitis of elbow, left 05/06/2022   Diabetic neuropathy (HCC) 01/24/2022   Trapezius strain, left, initial encounter 02/02/2021   Left shoulder pain 12/30/2020   Acute pulmonary edema (HCC)    Benign paroxysmal positional  vertigo 09/14/2020   Anemia associated with chronic renal failure    Hemoglobinuria 12/12/2018   OSA (obstructive sleep apnea) 02/28/2018   Chronic diastolic heart failure (HCC) 02/02/2018   Preventative health care 08/15/2017   Erectile dysfunction 04/22/2017   History of pulmonary embolus (PE) 06/09/2016   Depression 05/04/2016   Hypogonadism in male 05/04/2016   Urinary retention 05/04/2016   Dyslipidemia 08/12/2014   Unilateral AKA, right (HCC) 12/26/2013   Hyperlipidemia 02/19/2013   S/P cardiac cath, 02/18/13, normal coronaries 02/19/2013   Chronic renal insufficiency, stage 3 (moderate) (HCC) 02/19/2013   Hypertension 07/24/2011   Long term current use of anticoagulant therapy 07/19/2011   History of DVT (deep vein thrombosis) 07/28/2009   Morbid obesity (HCC) 10/26/2005   Insulin dependent type 2 diabetes mellitus, controlled (HCC) 01/09/2002   Past Medical History:  Diagnosis Date   Acute venous embolism and thrombosis of deep vessels of proximal lower extremity (HCC) 07/19/2011   Anemia    Anginal pain (HCC)    pt denies   Anxiety    Chest pain, neg MI, normal coronaries by cath 02/18/2013   pt denies   CHF (congestive heart failure) (HCC)    Chronic renal disease, stage IV (HCC) 02/19/2013   Colon polyps    Depression    Diabetic ulcer of right foot (HCC)    DVT (deep venous thrombosis) (HCC) 09/2002   patient reports additional DVTs in '06 & '11 (unconfirmed)   ED (erectile dysfunction)    GERD (gastroesophageal reflux disease)    History of blood transfusion    "related to OR" (10/31/2016)   Hyperlipidemia 02/19/2013   Hypertension    Nausea & vomiting    "constant for the last couple weeks" (10/31/2016)   Nephrotic syndrome    Obesity    BMI 44, weight 346 pounds 01/30/14   OSA (obstructive sleep apnea) 02/28/2018   Mild obstructive sleep apnea with an AHI of 9.8/h but severe during REM sleep with an AHI of 33.8/h.  Oxygen saturations dropped to 86% and  there was moderate snoring   Peripheral edema    Pneumonia    Prosthesis adjustment 08/17/2016   Pulmonary embolism (HCC) 09/2002   treated with 6 months of warfarin   Pyelonephritis 02/02/2018   Type I diabetes mellitus (HCC) dx'd 2001   Urinary retention     Family History  Problem Relation Age of Onset   Heart disease Mother    Diabetes Mother    Diabetes Maternal Uncle    Diabetes Cousin     Past Surgical History:  Procedure Laterality Date   AMPUTATION Right 12/22/2013   Procedure: AMPUTATION BELOW KNEE;  Surgeon: Toni Arthurs, MD;  Location: [facility] OR;  Service: Orthopedics;  Laterality: Right;   AMPUTATION Right 02/19/2014   Procedure: RIGHT ABOVE KNEE AMPUTATION ;  Surgeon: Jonny Ruiz  Victorino Dike, MD;  Location: [facility] OR;  Service: Orthopedics;  Laterality: Right;   blood clot removal  2016   CARDIAC CATHETERIZATION  02/18/2013   normal coronaries   ESOPHAGOGASTRODUODENOSCOPY N/A 11/02/2016   Procedure: ESOPHAGOGASTRODUODENOSCOPY (EGD);  Surgeon: Iva Boop, MD;  Location: [facility] ENDOSCOPY;  Service: Endoscopy;  Laterality: N/A;   I & D EXTREMITY Right 12/14/2013   Procedure: IRRIGATION AND DEBRIDEMENT RIGHT FOOT;  Surgeon: Verlee Rossetti, MD;  Location: [facility] OR;  Service: Orthopedics;  Laterality: Right;   INCISION AND DRAINAGE ABSCESS  2007; 2015   "back"   INCISION AND DRAINAGE OF WOUND Right 12/22/2013   Procedure: I&D RIGHT BUTTOCK;  Surgeon: Toni Arthurs, MD;  Location: [facility] OR;  Service: Orthopedics;  Laterality: Right;   LEFT HEART CATHETERIZATION WITH CORONARY ANGIOGRAM N/A 02/18/2013   Procedure: LEFT HEART CATHETERIZATION WITH CORONARY ANGIOGRAM;  Surgeon: Peter M Swaziland, MD;  Location: [facility] CATH LAB;  Service: Cardiovascular;  Laterality: N/A;   Social History   Occupational History   Occupation: student    Comment: [employer]   Occupation: disability  Tobacco Use   Smoking status: Never   Smokeless tobacco: Never  Vaping Use   Vaping status: Never Used  Substance and Sexual Activity    Alcohol use: Yes    Comment: 10/31/2016 "a few beers q 6 months or so"   Drug use: No   Sexual activity: Yes

## 2023-01-31 ENCOUNTER — Ambulatory Visit: Payer: Medicare HMO

## 2023-01-31 VITALS — Ht 74.0 in | Wt 314.0 lb

## 2023-01-31 DIAGNOSIS — Z0000 Encounter for general adult medical examination without abnormal findings: Secondary | ICD-10-CM

## 2023-01-31 NOTE — Patient Instructions (Addendum)
[name] , Thank you for taking time to come for your Medicare Wellness Visit. I appreciate your ongoing commitment to your health goals. Please review the following plan we discussed and let me know if I can assist you in the future.   Referrals/Orders/Follow-Ups/Clinician Recommendations: Yes; Keep maintaining your health by keeping your appointments with Dr. Mickie Bail and any specialists that you may see.  Call us if you need anything.  Have a great year!!!!  This is a list of the screening recommended for you and due dates:  Health Maintenance  Topic Date Due   Hepatitis C Screening  Never done   Yearly kidney health urinalysis for diabetes  09/20/2019   Eye exam for diabetics  06/15/2021   COVID-19 Vaccine (1 - 2024-25 season) Never done   Hemoglobin A1C  03/08/2023   Complete foot exam   12/06/2023   Yearly kidney function blood test for diabetes  01/17/2024   Medicare Annual Wellness Visit  01/31/2024   Colon Cancer Screening  08/02/2025   DTaP/Tdap/Td vaccine (2 - Td or Tdap) 08/15/2027   Pneumococcal Vaccination  Completed   Flu Shot  Completed   HIV Screening  Completed   HPV Vaccine  Aged Out    Advanced directives: (Declined) Advance directive discussed with you today. Even though you declined this today, please call our office should you change your mind, and we can give you the proper paperwork for you to fill out. Patient stated that he is working on completing ACP.  Next Medicare Annual Wellness Visit scheduled for next year: No

## 2023-01-31 NOTE — Progress Notes (Addendum)
Subjective:   [name] is a 48 y.o. male who presents for Medicare Annual/Subsequent preventive examination.  Visit Complete: Virtual I connected with  [name] on 01/31/23 by a audio enabled telemedicine application and verified that I am speaking with the correct person using two identifiers.  Patient Location: Home  Provider Location: Home Office  I discussed the limitations of evaluation and management by telemedicine. The patient expressed understanding and agreed to proceed.  Vital Signs: Because this visit was a virtual/telehealth visit, some criteria may be missing or patient reported. Any vitals not documented were not able to be obtained and vitals that have been documented are patient reported.  Interactive audio and video telecommunications were attempted between this provider and patient, however failed, due to patient having technical difficulties OR patient did not have access to video capability.  We continued and completed visit with audio only.  Cardiac Risk Factors include: diabetes mellitus;family history of premature cardiovascular disease;hypertension;male gender;obesity (BMI >30kg/m2);dyslipidemia     Objective:    Today's Vitals   01/31/23 1504 01/31/23 1506  Weight: (!) 314 lb (142.4 kg)   Height: 6\' 2"  (1.88 m)   PainSc: 4  4   PainLoc: Elbow    Body mass index is 40.32 kg/m.     01/31/2023    3:08 PM 08/31/2022    5:51 AM 06/14/2022    9:09 AM 06/06/2022    1:43 PM 05/04/2022   10:19 AM 01/24/2022    4:48 PM 01/24/2022    2:54 PM  Advanced Directives  Does Patient Have a Medical Advance Directive? No No  No No No No  Would patient like information on creating a medical advance directive? No - Patient declined No - Patient declined No - Patient declined No - Patient declined No - Patient declined No - Patient declined No - Patient declined    Current Medications (verified) Outpatient Encounter Medications as of 01/31/2023  Medication Sig    acidophilus (RISAQUAD) CAPS capsule Take 1 capsule by mouth daily.   Alpha-Lipoic Acid 600 MG CAPS Take 600 mg by mouth daily.   amLODipine (NORVASC) 10 MG tablet Take 10 mg by mouth at bedtime.   B-D UF III MINI PEN NEEDLES 31G X 5 MM MISC USE 1 NEEDLE FIVE TIMES DAILY   Blood Glucose Monitoring Suppl (ACCU-CHEK GUIDE ME) w/Device KIT Use to check blood sugar up to 3 times a day   carvedilol (COREG) 25 MG tablet TAKE 1 TABLET BY MOUTH TWICE DAILY WITH A MEAL   chlorthalidone (HYGROTON) 25 MG tablet Take 25 mg by mouth daily. (Patient not taking: Reported on 01/31/2023)   cloNIDine (CATAPRES - DOSED IN MG/24 HR) 0.1 mg/24hr patch 0.1 mg once a week.   Cyanocobalamin (B-12) 500 MCG SUBL Place 500 mcg under the tongue daily.    diclofenac Sodium (VOLTAREN) 1 % GEL Apply 2 g topically 4 (four) times daily.   ELIQUIS 5 MG TABS tablet Take 1 tablet by mouth twice daily   famotidine (PEPCID) 20 MG tablet Take 1 tablet (20 mg total) by mouth 2 (two) times daily. (Patient taking differently: Take 20 mg by mouth 2 (two) times daily as needed for heartburn.)   fluticasone (FLONASE) 50 MCG/ACT nasal spray Place 2 sprays into both nostrils daily. (Patient taking differently: Place 2 sprays into both nostrils daily as needed for allergies.)   furosemide (LASIX) 80 MG tablet Take 1 tablet by mouth twice daily (Patient taking differently: Take 80 mg by mouth 2 (  two) times daily.)   gabapentin (NEURONTIN) 100 MG capsule TAKE 1 CAPSULE BY MOUTH THREE TIMES DAILY   Glucagon (GVOKE HYPOPEN 1-PACK) 1 MG/0.2ML SOAJ Inject 1 mg into the skin as needed (low blood sugar with impaired consciousness).   glucose blood (ACCU-CHEK GUIDE) test strip Check blood sugar up to 3 times a day   hydrALAZINE (APRESOLINE) 100 MG tablet Take 100 mg by mouth 3 (three) times daily.   insulin degludec (TRESIBA FLEXTOUCH) 200 UNIT/ML FlexTouch Pen INJECT 34 UNITS SUBCUTANEOUSLY ONCE DAILY (Patient taking differently: INJECT 36 UNITS  SUBCUTANEOUSLY ONCE DAILY)   Insulin Pen Needle (B-D UF III MINI PEN NEEDLES) 31G X 5 MM MISC 1 each by Other route See admin instructions. USE 4-5 TIMES DAILY   Lancet Devices (ACCU-CHEK SOFTCLIX) lancets Use to check blood sugars 5 times a day. Dx code:E11.9. Insulin dependent. (Patient taking differently: 1 each by Other route See admin instructions. Use to check blood sugars 5 times a day. Dx code:E11.9. Insulin dependent.)   lidocaine (HM LIDOCAINE PATCH) 4 % Place 1 patch onto the skin daily.   lovastatin (MEVACOR) 20 MG tablet Take 1 tablet by mouth once daily   methocarbamol (ROBAXIN) 500 MG tablet Take 1 tablet (500 mg total) by mouth at bedtime as needed for muscle spasms.   NOVOLOG FLEXPEN 100 UNIT/ML FlexPen Inject 0-4 Units into the skin 3 (three) times daily with meals. Administer 0 units in the morning with breakfast, 0-4 units with lunch, and 0-4 units with dinner daily, maximum 30 units/day   oxybutynin (DITROPAN) 5 MG tablet TAKE 1 TABLET BY MOUTH THREE TIMES DAILY   polyethylene glycol (MIRALAX / GLYCOLAX) 17 g packet Take 17 g by mouth 2 (two) times daily. (Patient taking differently: Take 17 g by mouth as needed for mild constipation.)   potassium chloride SA (KLOR-CON) 20 MEQ tablet Take 20 mEq by mouth 2 (two) times daily.   senna-docusate (SENOKOT-S) 8.6-50 MG tablet Take 1 tablet by mouth daily. (Patient taking differently: Take 1 tablet by mouth as needed for mild constipation.)   sildenafil (VIAGRA) 50 MG tablet Take 1-2 tablets (50-100 mg total) by mouth as needed for erectile dysfunction. Take 1 hour before needed. Do not take more than 2 tablets at once.   tamsulosin (FLOMAX) 0.4 MG CAPS capsule Take 1 capsule by mouth at bedtime   tirzepatide (MOUNJARO) 15 MG/0.5ML Pen Inject 15 mg into the skin once a week.   No facility-administered encounter medications on file as of 01/31/2023.    Allergies (verified) Reglan [metoclopramide], Metformin and related, and Ozempic  (0.25 or 0.5 mg-dose) [semaglutide(0.25 or 0.5mg -dos)]   History: Past Medical History:  Diagnosis Date   Acute venous embolism and thrombosis of deep vessels of proximal lower extremity (HCC) 07/19/2011   Anemia    Anginal pain (HCC)    pt denies   Anxiety    Chest pain, neg MI, normal coronaries by cath 02/18/2013   pt denies   CHF (congestive heart failure) (HCC)    Chronic renal disease, stage IV (HCC) 02/19/2013   Colon polyps    Depression    Diabetic ulcer of right foot (HCC)    DVT (deep venous thrombosis) (HCC) 09/2002   patient reports additional DVTs in '06 & '11 (unconfirmed)   ED (erectile dysfunction)    GERD (gastroesophageal reflux disease)    History of blood transfusion    "related to OR" (10/31/2016)   Hyperlipidemia 02/19/2013   Hypertension    Nausea &  vomiting    "constant for the last couple weeks" (10/31/2016)   Nephrotic syndrome    Obesity    BMI 44, weight 346 pounds 01/30/14   OSA (obstructive sleep apnea) 02/28/2018   Mild obstructive sleep apnea with an AHI of 9.8/h but severe during REM sleep with an AHI of 33.8/h.  Oxygen saturations dropped to 86% and there was moderate snoring   Peripheral edema    Pneumonia    Prosthesis adjustment 08/17/2016   Pulmonary embolism (HCC) 09/2002   treated with 6 months of warfarin   Pyelonephritis 02/02/2018   Type I diabetes mellitus (HCC) dx'd 2001   Urinary retention    Past Surgical History:  Procedure Laterality Date   AMPUTATION Right 12/22/2013   Procedure: AMPUTATION BELOW KNEE;  Surgeon: Toni Arthurs, MD;  Location: [facility] OR;  Service: Orthopedics;  Laterality: Right;   AMPUTATION Right 02/19/2014   Procedure: RIGHT ABOVE KNEE AMPUTATION ;  Surgeon: Toni Arthurs, MD;  Location: [facility] OR;  Service: Orthopedics;  Laterality: Right;   blood clot removal  2016   CARDIAC CATHETERIZATION  02/18/2013   normal coronaries   ESOPHAGOGASTRODUODENOSCOPY N/A 11/02/2016   Procedure: ESOPHAGOGASTRODUODENOSCOPY (EGD);   Surgeon: Iva Boop, MD;  Location: [facility] ENDOSCOPY;  Service: Endoscopy;  Laterality: N/A;   I & D EXTREMITY Right 12/14/2013   Procedure: IRRIGATION AND DEBRIDEMENT RIGHT FOOT;  Surgeon: Verlee Rossetti, MD;  Location: [facility] OR;  Service: Orthopedics;  Laterality: Right;   INCISION AND DRAINAGE ABSCESS  2007; 2015   "back"   INCISION AND DRAINAGE OF WOUND Right 12/22/2013   Procedure: I&D RIGHT BUTTOCK;  Surgeon: Toni Arthurs, MD;  Location: [facility] OR;  Service: Orthopedics;  Laterality: Right;   LEFT HEART CATHETERIZATION WITH CORONARY ANGIOGRAM N/A 02/18/2013   Procedure: LEFT HEART CATHETERIZATION WITH CORONARY ANGIOGRAM;  Surgeon: Peter M Swaziland, MD;  Location: [facility] CATH LAB;  Service: Cardiovascular;  Laterality: N/A;   Family History  Problem Relation Age of Onset   Heart disease Mother    Diabetes Mother    Diabetes Maternal Uncle    Diabetes Cousin    Social History   Socioeconomic History   Marital status: Single    Spouse name: Not on file   Number of children: 0   Years of education: 15.5   Highest education level: Some college, no degree  Occupational History   Occupation: Consulting civil engineer    Comment: [employer]   Occupation: disability  Tobacco Use   Smoking status: Never   Smokeless tobacco: Never  Vaping Use   Vaping status: Never Used  Substance and Sexual Activity   Alcohol use: Yes    Comment: 10/31/2016 "a few beers q 6 months or so"   Drug use: No   Sexual activity: Yes  Other Topics Concern   Not on file  Social History Narrative   Financial assistance approved for 100% discount at [facility] and has [facility] card per [employer]   09/01/2009.      Lives in [place] with mother and sister.            Social Drivers of Health   Financial Resource Strain: Medium Risk (01/31/2023)   Overall Financial Resource Strain (CARDIA)    Difficulty of Paying Living Expenses: Somewhat hard  Food Insecurity: No Food Insecurity (01/31/2023)   Hunger Vital Sign    Worried About Running  Out of Food in the Last Year: Never true    Ran Out of Food in the Last Year: Never  true  Transportation Needs: No Transportation Needs (01/31/2023)   PRAPARE - Administrator, Civil Service (Medical): No    Lack of Transportation (Non-Medical): No  Physical Activity: Sufficiently Active (01/31/2023)   Exercise Vital Sign    Days of Exercise per Week: 6 days    Minutes of Exercise per Session: 120 min  Stress: No Stress Concern Present (01/31/2023)   [employer] of Occupational Health - Occupational Stress Questionnaire    Feeling of Stress : Not at all  Social Connections: Socially Isolated (01/31/2023)   Social Connection and Isolation Panel [NHANES]    Frequency of Communication with Friends and Family: More than three times a week    Frequency of Social Gatherings with Friends and Family: More than three times a week    Attends Religious Services: Never    Database administrator or Organizations: No    Attends Engineer, structural: Never    Marital Status: Never married    Tobacco Counseling Counseling given: Not Answered   Clinical Intake:  Pre-visit preparation completed: Yes  Pain : 0-10 Pain Score: 4  Pain Location: Elbow Pain Orientation: Left     BMI - recorded: 40.32 Nutritional Status: BMI > 30  Obese Nutritional Risks: None Diabetes: Yes CBG done?: No Did pt. bring in CBG monitor from home?: No  How often do you need to have someone help you when you read instructions, pamphlets, or other written materials from your doctor or pharmacy?: 1 - Never What is the last grade level you completed in school?: College -2 years  Interpreter Needed?: No  Information entered by :: Susie Cassette, LPN.   Activities of Daily Living    01/31/2023    3:11 PM 06/14/2022    9:09 AM  In your present state of health, do you have any difficulty performing the following activities:  Hearing? 0 0  Vision? 0 0  Difficulty concentrating or making  decisions? 0 0  Walking or climbing stairs? 0 1  Dressing or bathing? 0 0  Doing errands, shopping? 0 0  Preparing Food and eating ? N   Using the Toilet? N   In the past six months, have you accidently leaked urine? N   Do you have problems with loss of bowel control? N   Managing your Medications? N   Managing your Finances? N   Housekeeping or managing your Housekeeping? N     Patient Care Team: Kathleen Lime, MD as PCP - General Quintella Reichert, MD as PCP - Cardiology (Cardiology) Quintella Reichert, MD as Consulting Physician (Cardiology) Reather Littler, MD (Inactive) as Consulting Physician (Endocrinology) Arita Miss, MD as Attending Physician (Nephrology) [facility] as Consulting Physician (Ophthalmology) Harrold Donath, MD as Consulting Physician (Ophthalmology)  Indicate any recent Medical Services you may have received from other than [facility] providers in the past year (date may be approximate).     Assessment:   This is a routine wellness examination for Rylee.  Hearing/Vision screen Hearing Screening - Comments:: Denies hearing difficulties.  Vision Screening - Comments:: Wears rx glasses - up to date with routine eye exams with [facility]    Goals Addressed   None   Depression Screen    01/31/2023    3:10 PM 06/14/2022    9:09 AM 06/06/2022    1:43 PM 05/04/2022   10:21 AM 01/24/2022    4:35 PM 01/24/2022    2:54 PM 12/22/2021  9:07 AM  PHQ 2/9 Scores  PHQ - 2 Score 0 0 0 0 0 0 0  PHQ- 9 Score 0 0 0 0 0 0 0    Fall Risk    01/31/2023    3:09 PM 06/14/2022    9:09 AM 06/06/2022    1:42 PM 05/04/2022   10:21 AM 01/24/2022    2:51 PM  Fall Risk   Falls in the past year? 0 0 0 0 0  Number falls in past yr: 0  0  0  Injury with Fall? 0  0  0  Risk for fall due to : No Fall Risks No Fall Risks Impaired balance/gait;Impaired mobility Impaired balance/gait;Impaired mobility No Fall Risks  Follow up Falls prevention discussed  Falls evaluation completed Falls evaluation completed Falls evaluation completed Falls evaluation completed    MEDICARE RISK AT HOME: Medicare Risk at Home Any stairs in or around the home?: No If so, are there any without handrails?: No Home free of loose throw rugs in walkways, pet beds, electrical cords, etc?: Yes Adequate lighting in your home to reduce risk of falls?: Yes Life alert?: No Use of a cane, walker or w/c?: No Grab bars in the bathroom?: Yes Shower chair or bench in shower?: Yes Elevated toilet seat or a handicapped toilet?: Yes  TIMED UP AND GO:  Was the test performed?  No    Cognitive Function:    01/31/2023    3:18 PM 12/11/2017   10:26 AM  MMSE - Mini Mental State Exam  Not completed: Unable to complete Unable to complete        01/31/2023    3:10 PM  6CIT Screen  What Year? 0 points  What month? 0 points  What time? 0 points  Count back from 20 0 points  Months in reverse 0 points  Repeat phrase 0 points  Total Score 0 points    Immunizations Immunization History  Administered Date(s) Administered   Influenza Split 12/29/2010, 02/12/2012   Influenza Whole 12/28/2008   Influenza,inj,Quad PF,6+ Mos 12/24/2012, 12/10/2013, 11/01/2016, 09/24/2017, 09/24/2019, 09/23/2020, 09/15/2021   PNEUMOCOCCAL CONJUGATE-20 12/28/2020   Pneumococcal Polysaccharide-23 07/29/2009   Tdap 08/14/2017    TDAP status: Up to date  Flu Vaccine status: Up to date  Pneumococcal vaccine status: Up to date  Covid-19 vaccine status: Declined, Education has been provided regarding the importance of this vaccine but patient still declined. Advised may receive this vaccine at local pharmacy or Health Dept.or vaccine clinic. Aware to provide a copy of the vaccination record if obtained from local pharmacy or Health Dept. Verbalized acceptance and understanding.  Qualifies for Shingles Vaccine? No   Zostavax completed No   Shingrix Completed?: No.    Education has been  provided regarding the importance of this vaccine. Patient has been advised to call insurance company to determine out of pocket expense if they have not yet received this vaccine. Advised may also receive vaccine at local pharmacy or Health Dept. Verbalized acceptance and understanding.  Screening Tests Health Maintenance  Topic Date Due   Hepatitis C Screening  Never done   Diabetic kidney evaluation - Urine ACR  09/20/2019   OPHTHALMOLOGY EXAM  06/15/2021   COVID-19 Vaccine (1 - 2024-25 season) Never done   HEMOGLOBIN A1C  03/08/2023   FOOT EXAM  12/06/2023   Diabetic kidney evaluation - eGFR measurement  01/17/2024   Medicare Annual Wellness (AWV)  01/31/2024   Colonoscopy  08/02/2025   DTaP/Tdap/Td (2 - Td  or Tdap) 08/15/2027   Pneumococcal Vaccine 71-85 Years old  Completed   INFLUENZA VACCINE  Completed   HIV Screening  Completed   HPV VACCINES  Aged Out    Health Maintenance  Health Maintenance Due  Topic Date Due   Hepatitis C Screening  Never done   Diabetic kidney evaluation - Urine ACR  09/20/2019   OPHTHALMOLOGY EXAM  06/15/2021   COVID-19 Vaccine (1 - 2024-25 season) Never done    Colorectal cancer screening: Type of screening: Colonoscopy. Completed 08/03/2015. Repeat every 10 years  Lung Cancer Screening: (Low Dose CT Chest recommended if Age 40-80 years, 20 pack-year currently smoking OR have quit w/in 15years.) does not qualify.   Lung Cancer Screening Referral: no  Additional Screening:  Hepatitis C Screening: does qualify; Completed: no  Vision Screening: Recommended annual ophthalmology exams for early detection of glaucoma and other disorders of the eye. Is the patient up to date with their annual eye exam?  Yes  Who is the provider or what is the name of the office in which the patient attends annual eye exams? [facility] and [facility] If pt is not established with a provider, would they like to be referred to a provider to  establish care? No .   Dental Screening: Recommended annual dental exams for proper oral hygiene  Diabetic Foot Exam: Diabetic Foot Exam: Completed 12/06/2022  Community Resource Referral / Chronic Care Management: CRR required this visit?  No   CCM required this visit?  No     Plan:     I have personally reviewed and noted the following in the patient's chart:   Medical and social history Use of alcohol, tobacco or illicit drugs  Current medications and supplements including opioid prescriptions. Patient is not currently taking opioid prescriptions. Functional ability and status Nutritional status Physical activity Advanced directives List of other physicians Hospitalizations, surgeries, and ER visits in previous 12 months Vitals Screenings to include cognitive, depression, and falls Referrals and appointments  In addition, I have reviewed and discussed with patient certain preventive protocols, quality metrics, and best practice recommendations. A written personalized care plan for preventive services as well as general preventive health recommendations were provided to patient.     Mickeal Needy, LPN   [id number]/61/0960   After Visit Summary: (MyChart) Due to this being a telephonic visit, the after visit summary with patients personalized plan was offered to patient via MyChart   Nurse Notes: Please see routing comments.   Internal Medicine Attending:  I reviewed the AWV findings of the medical professional who conducted the visit. I was present in the office suite and immediately available to provide assistance and direction throughout the time the service was provided.

## 2023-02-06 ENCOUNTER — Encounter: Payer: Self-pay | Admitting: Physician Assistant

## 2023-02-06 ENCOUNTER — Other Ambulatory Visit (INDEPENDENT_AMBULATORY_CARE_PROVIDER_SITE_OTHER): Payer: Self-pay

## 2023-02-06 ENCOUNTER — Encounter (HOSPITAL_COMMUNITY): Payer: Self-pay

## 2023-02-06 ENCOUNTER — Ambulatory Visit (INDEPENDENT_AMBULATORY_CARE_PROVIDER_SITE_OTHER): Payer: Medicare HMO | Admitting: Physician Assistant

## 2023-02-06 DIAGNOSIS — M25531 Pain in right wrist: Secondary | ICD-10-CM

## 2023-02-06 DIAGNOSIS — M25521 Pain in right elbow: Secondary | ICD-10-CM | POA: Diagnosis not present

## 2023-02-06 MED ORDER — PREDNISONE 5 MG (21) PO TBPK: ORAL_TABLET | ORAL | 0 refills | Status: DC

## 2023-02-06 NOTE — Progress Notes (Signed)
Office Visit Note   Patient: [name]           Date of Birth: Nov 21, 1975           MRN: [id number] Visit Date: 02/06/2023              Requested by: Kathleen Lime, MD [address] [place],  [place] [address] PCP: Kathleen Lime, MD   Assessment & Plan: Visit Diagnoses:  1. Pain in right wrist   2. Pain in right elbow     Plan: Impression is right wrist pain and swelling radiating into the elbow.  Symptoms do not appear consistent with infection including extensor tenosynovitis or gout.  Symptoms seem most consistent with post injection flare.  We have discussed starting him on a steroid taper.  He has been instructed to not take NSAIDs with this.  He is to keep a close eye on his blood sugar over the next few weeks.  If his symptoms do not improve or happen to worsen in the meantime, he will let us know.  Otherwise, follow-up as needed.  Follow-Up Instructions: Return if symptoms worsen or fail to improve.   Orders:  Orders Placed This Encounter  Procedures   XR Wrist Complete Right   Meds ordered this encounter  Medications   predniSONE (STERAPRED UNI-PAK 21 TAB) 5 MG (21) TBPK tablet    Sig: Take as directed.  Do not take with any other nsaids.  Keep close eye on blood sugars    Dispense:  21 tablet    Refill:  0      Procedures: No procedures performed   Clinical Data: No additional findings.   Subjective: Chief Complaint  Patient presents with   Right Wrist - Pain   Right Elbow - Pain    HPI patient is a 48 year old gentleman comes in today with worsening right wrist pain which is recently started radiating up the forearm and into the elbow.  Symptoms have been ongoing since this past Saturday.  He denies any injury but was seen in our office on 01/30/2023 for de Quervain's tenosynovitis.  This was injected with cortisone.  He notes in addition to have the swelling that he has had increased pain.  Symptoms are worse with any movement of the wrist, fingers or  elbow.  He has been taking NSAIDs without relief.  He denies any history of gout.  No fevers, chills or any other constitutional symptoms.  He does have a history of DVTs of the lower extremity and is on Eliquis.  Review of Systems as detailed in HPI.  All others reviewed and are negative.   Objective: Vital Signs: There were no vitals taken for this visit.  Physical Exam well-developed well-nourished gentleman in no acute distress.  Alert and oriented x 3.  Ortho Exam right upper extremity exam: He does have diffuse tenderness throughout the elbow.  Mild swelling to the right elbow radiating into the forearm.  Increased pain and limited range of motion with flexion and extension of the elbow.  Right wrist and hand exam: Reveals moderate swelling.  Diffuse tenderness throughout.  No erythema.  Pain with extension and flexion of the fingers and wrist.  He is neurovascularly intact distally. Specialty Comments:  No specialty comments available.  Imaging: XR Wrist Complete Right Result Date: 02/06/2023 No acute or structural abnormalities    PMFS History: Patient Active Problem List   Diagnosis Date Noted   Elbow strain, left, initial encounter 06/06/2022  Medial epicondylitis of elbow, left 05/06/2022   Diabetic neuropathy (HCC) 01/24/2022   Trapezius strain, left, initial encounter 02/02/2021   Left shoulder pain 12/30/2020   Acute pulmonary edema (HCC)    Benign paroxysmal positional vertigo 09/14/2020   Anemia associated with chronic renal failure    Hemoglobinuria 12/12/2018   OSA (obstructive sleep apnea) 02/28/2018   Chronic diastolic heart failure (HCC) 02/02/2018   Preventative health care 08/15/2017   Erectile dysfunction 04/22/2017   History of pulmonary embolus (PE) 06/09/2016   Depression 05/04/2016   Hypogonadism in male 05/04/2016   Urinary retention 05/04/2016   Dyslipidemia 08/12/2014   Unilateral AKA, right (HCC) 12/26/2013   Hyperlipidemia 02/19/2013   S/P  cardiac cath, 02/18/13, normal coronaries 02/19/2013   Chronic renal insufficiency, stage 3 (moderate) (HCC) 02/19/2013   Hypertension 07/24/2011   Long term current use of anticoagulant therapy 07/19/2011   History of DVT (deep vein thrombosis) 07/28/2009   Morbid obesity (HCC) 10/26/2005   Insulin dependent type 2 diabetes mellitus, controlled (HCC) 01/09/2002   Past Medical History:  Diagnosis Date   Acute venous embolism and thrombosis of deep vessels of proximal lower extremity (HCC) 07/19/2011   Anemia    Anginal pain (HCC)    pt denies   Anxiety    Chest pain, neg MI, normal coronaries by cath 02/18/2013   pt denies   CHF (congestive heart failure) (HCC)    Chronic renal disease, stage IV (HCC) 02/19/2013   Colon polyps    Depression    Diabetic ulcer of right foot (HCC)    DVT (deep venous thrombosis) (HCC) 09/2002   patient reports additional DVTs in '06 & '11 (unconfirmed)   ED (erectile dysfunction)    GERD (gastroesophageal reflux disease)    History of blood transfusion    "related to OR" (10/31/2016)   Hyperlipidemia 02/19/2013   Hypertension    Nausea & vomiting    "constant for the last couple weeks" (10/31/2016)   Nephrotic syndrome    Obesity    BMI 44, weight 346 pounds 01/30/14   OSA (obstructive sleep apnea) 02/28/2018   Mild obstructive sleep apnea with an AHI of 9.8/h but severe during REM sleep with an AHI of 33.8/h.  Oxygen saturations dropped to 86% and there was moderate snoring   Peripheral edema    Pneumonia    Prosthesis adjustment 08/17/2016   Pulmonary embolism (HCC) 09/2002   treated with 6 months of warfarin   Pyelonephritis 02/02/2018   Type I diabetes mellitus (HCC) dx'd 2001   Urinary retention     Family History  Problem Relation Age of Onset   Heart disease Mother    Diabetes Mother    Diabetes Maternal Uncle    Diabetes Cousin     Past Surgical History:  Procedure Laterality Date   AMPUTATION Right 12/22/2013   Procedure:  AMPUTATION BELOW KNEE;  Surgeon: Toni Arthurs, MD;  Location: [facility] OR;  Service: Orthopedics;  Laterality: Right;   AMPUTATION Right 02/19/2014   Procedure: RIGHT ABOVE KNEE AMPUTATION ;  Surgeon: Toni Arthurs, MD;  Location: [facility] OR;  Service: Orthopedics;  Laterality: Right;   blood clot removal  2016   CARDIAC CATHETERIZATION  02/18/2013   normal coronaries   ESOPHAGOGASTRODUODENOSCOPY N/A 11/02/2016   Procedure: ESOPHAGOGASTRODUODENOSCOPY (EGD);  Surgeon: Iva Boop, MD;  Location: [facility] ENDOSCOPY;  Service: Endoscopy;  Laterality: N/A;   I & D EXTREMITY Right 12/14/2013   Procedure: IRRIGATION AND DEBRIDEMENT RIGHT FOOT;  Surgeon: Almedia Balls  Ranell Patrick, MD;  Location: [facility] OR;  Service: Orthopedics;  Laterality: Right;   INCISION AND DRAINAGE ABSCESS  2007; 2015   "back"   INCISION AND DRAINAGE OF WOUND Right 12/22/2013   Procedure: I&D RIGHT BUTTOCK;  Surgeon: Toni Arthurs, MD;  Location: [facility] OR;  Service: Orthopedics;  Laterality: Right;   LEFT HEART CATHETERIZATION WITH CORONARY ANGIOGRAM N/A 02/18/2013   Procedure: LEFT HEART CATHETERIZATION WITH CORONARY ANGIOGRAM;  Surgeon: Peter M Swaziland, MD;  Location: [facility] CATH LAB;  Service: Cardiovascular;  Laterality: N/A;   Social History   Occupational History   Occupation: student    Comment: [employer]   Occupation: disability  Tobacco Use   Smoking status: Never   Smokeless tobacco: Never  Vaping Use   Vaping status: Never Used  Substance and Sexual Activity   Alcohol use: Yes    Comment: 10/31/2016 "a few beers q 6 months or so"   Drug use: No   Sexual activity: Yes

## 2023-02-07 ENCOUNTER — Ambulatory Visit (HOSPITAL_COMMUNITY)
Admission: RE | Admit: 2023-02-07 | Discharge: 2023-02-07 | Disposition: A | Discharge status: Home / Self Care | Payer: Medicare HMO | Source: Ambulatory Visit | Attending: Nephrology | Admitting: Nephrology

## 2023-02-07 ENCOUNTER — Encounter (HOSPITAL_COMMUNITY): Payer: Medicare HMO

## 2023-02-07 VITALS — BP 131/75 | HR 87 | Temp 97.7°F | Resp 17

## 2023-02-07 DIAGNOSIS — N183 Chronic kidney disease, stage 3 unspecified: Secondary | ICD-10-CM | POA: Diagnosis not present

## 2023-02-07 LAB — RENAL FUNCTION PANEL
Albumin: 3 g/dL — ABNORMAL LOW (ref 3.5–5.0)
Anion gap: 10 (ref 5–15)
BUN: 80 mg/dL — ABNORMAL HIGH (ref 6–20)
CO2: 24 mmol/L (ref 22–32)
Calcium: 9.3 mg/dL (ref 8.9–10.3)
Chloride: 104 mmol/L (ref 98–111)
Creatinine, Ser: 3.13 mg/dL — ABNORMAL HIGH (ref 0.61–1.24)
GFR, Estimated: 24 mL/min — ABNORMAL LOW (ref >60)
Glucose, Bld: 81 mg/dL (ref 70–99)
Phosphorus: 3.6 mg/dL (ref 2.5–4.6)
Potassium: 4 mmol/L (ref 3.5–5.1)
Sodium: 138 mmol/L (ref 135–145)

## 2023-02-07 LAB — IRON AND TIBC
Iron: 44 ug/dL — ABNORMAL LOW (ref 45–182)
Saturation Ratios: 19 % (ref 17.9–39.5)
TIBC: 237 ug/dL — ABNORMAL LOW (ref 250–450)
UIBC: 193 ug/dL

## 2023-02-07 LAB — POCT HEMOGLOBIN-HEMACUE: Hemoglobin: 10.2 g/dL — ABNORMAL LOW (ref 13.0–17.0)

## 2023-02-07 LAB — FERRITIN: Ferritin: 553 ng/mL — ABNORMAL HIGH (ref 24–336)

## 2023-02-07 MED ORDER — DARBEPOETIN ALFA 200 MCG/0.4ML IJ SOSY
200.0000 ug | PREFILLED_SYRINGE | INTRAMUSCULAR | Status: DC
  Administered 2023-02-07: 200 ug via SUBCUTANEOUS

## 2023-02-07 MED ORDER — DARBEPOETIN ALFA 200 MCG/0.4ML IJ SOSY
PREFILLED_SYRINGE | INTRAMUSCULAR | Status: AC
  Filled 2023-02-07: qty 0.4

## 2023-02-08 ENCOUNTER — Encounter: Payer: Self-pay | Admitting: Endocrinology

## 2023-02-08 ENCOUNTER — Ambulatory Visit: Payer: Medicare HMO | Admitting: Endocrinology

## 2023-02-08 ENCOUNTER — Encounter (HOSPITAL_COMMUNITY): Payer: Self-pay

## 2023-02-08 VITALS — BP 140/76 | HR 95 | Resp 20 | Ht 74.0 in | Wt 309.0 lb

## 2023-02-08 DIAGNOSIS — E119 Type 2 diabetes mellitus without complications: Secondary | ICD-10-CM | POA: Diagnosis not present

## 2023-02-08 DIAGNOSIS — Z794 Long term (current) use of insulin: Secondary | ICD-10-CM | POA: Diagnosis not present

## 2023-02-08 NOTE — Progress Notes (Signed)
Outpatient Endocrinology Note [place] Emmalee Solivan, MD  02/08/23  Patient's Name: [name]    DOB: 1975-11-24    MRN: [id number]                                                    REASON OF VISIT: Follow up for type 2 diabetes mellitus  PCP: Kathleen Lime, MD  HISTORY OF PRESENT ILLNESS:   [name] is a 48 y.o. old male with past medical history listed below, is here for follow up of type 2 diabetes mellitus.   Pertinent Diabetes History: Patient was diagnosed with type 2 diabetes mellitus in 2001.  Weight was 400 pounds at the time of diagnosis.  He was started on insulin therapy initially.  He was not able to tolerate metformin due to diarrhea.  Chronic Diabetes Complications : Retinopathy: Unknown.  Reports following with retina specialist however denies diabetes retinopathy. Nephropathy: CKD IV following with nephrology.  Microalbuminuria present. Peripheral neuropathy: Diabetic foot ulcers, status post right AKA 2015/2016. Coronary artery disease: no Stroke: no  Relevant comorbidities and cardiovascular risk factors: Obesity: yes Body mass index is 39.67 kg/m.  Hypertension: yes Hyperlipidemia. Yes, on a statin.  Current / Home Diabetic regimen includes: Tresiba 36 units daily. NovoLog 0 units breakfast 0-4 units before lunch and 0-4 units at dinner. Mounjaro 15 mg weekly.  Prior diabetic medications: Metformin stopped due to diarrhea. Ozempic had vomiting.  Glycemic data:    CONTINUOUS GLUCOSE MONITORING SYSTEM (CGMS) INTERPRETATION:                          FreeStyle Libre 2 CGM-  Sensor Download (Sensor download was reviewed and summarized below.) Dates: January 17 to February 08, 2023, 14 days  Glucose Management Indicator: 6.5%    Impression: -Mostly acceptable blood sugar, sporadic hyperglycemia with blood sugar up to 250 range postprandially.  Patient has occasional blood sugar in 60s/mild hypoglycemia in the morning and sometime in the afternoon  related to exercise.  Hypoglycemia: Patient has minor hypoglycemic episodes. Patient has hypoglycemia awareness.  Factors modifying glucose control: 1.  Diabetic diet assessment: 2 meals a day.  2.  Staying active or exercising: Workout a mostly every day 5-6 times a week in the morning.  3.  Medication compliance: compliant all of the time.  Interval history  Diabetes regimen reviewed and as noted above.  CGM data as reviewed above.  Patient reports he is currently taking prednisone /had a steroid injection into the right hand for tendinitis and causing occasional hyperglycemia.  GMI on CGM 6.5%.  He is exercising however not doing cardio lately due to hand tendinitis.  No other complaints today.  REVIEW OF SYSTEMS As per history of present illness.   PAST MEDICAL HISTORY: Past Medical History:  Diagnosis Date   Acute venous embolism and thrombosis of deep vessels of proximal lower extremity (HCC) 07/19/2011   Anemia    Anginal pain (HCC)    pt denies   Anxiety    Chest pain, neg MI, normal coronaries by cath 02/18/2013   pt denies   CHF (congestive heart failure) (HCC)    Chronic renal disease, stage IV (HCC) 02/19/2013   Colon polyps    Depression    Diabetic ulcer of right foot (HCC)    DVT (deep venous  thrombosis) (HCC) 09/2002   patient reports additional DVTs in '06 & '11 (unconfirmed)   ED (erectile dysfunction)    GERD (gastroesophageal reflux disease)    History of blood transfusion    "related to OR" (10/31/2016)   Hyperlipidemia 02/19/2013   Hypertension    Nausea & vomiting    "constant for the last couple weeks" (10/31/2016)   Nephrotic syndrome    Obesity    BMI 44, weight 346 pounds 01/30/14   OSA (obstructive sleep apnea) 02/28/2018   Mild obstructive sleep apnea with an AHI of 9.8/h but severe during REM sleep with an AHI of 33.8/h.  Oxygen saturations dropped to 86% and there was moderate snoring   Peripheral edema    Pneumonia    Prosthesis  adjustment 08/17/2016   Pulmonary embolism (HCC) 09/2002   treated with 6 months of warfarin   Pyelonephritis 02/02/2018   Type I diabetes mellitus (HCC) dx'd 2001   Urinary retention     PAST SURGICAL HISTORY: Past Surgical History:  Procedure Laterality Date   AMPUTATION Right 12/22/2013   Procedure: AMPUTATION BELOW KNEE;  Surgeon: Toni Arthurs, MD;  Location: [facility] OR;  Service: Orthopedics;  Laterality: Right;   AMPUTATION Right 02/19/2014   Procedure: RIGHT ABOVE KNEE AMPUTATION ;  Surgeon: Toni Arthurs, MD;  Location: [facility] OR;  Service: Orthopedics;  Laterality: Right;   blood clot removal  2016   CARDIAC CATHETERIZATION  02/18/2013   normal coronaries   ESOPHAGOGASTRODUODENOSCOPY N/A 11/02/2016   Procedure: ESOPHAGOGASTRODUODENOSCOPY (EGD);  Surgeon: Iva Boop, MD;  Location: [facility] ENDOSCOPY;  Service: Endoscopy;  Laterality: N/A;   I & D EXTREMITY Right 12/14/2013   Procedure: IRRIGATION AND DEBRIDEMENT RIGHT FOOT;  Surgeon: Verlee Rossetti, MD;  Location: [facility] OR;  Service: Orthopedics;  Laterality: Right;   INCISION AND DRAINAGE ABSCESS  2007; 2015   "back"   INCISION AND DRAINAGE OF WOUND Right 12/22/2013   Procedure: I&D RIGHT BUTTOCK;  Surgeon: Toni Arthurs, MD;  Location: [facility] OR;  Service: Orthopedics;  Laterality: Right;   LEFT HEART CATHETERIZATION WITH CORONARY ANGIOGRAM N/A 02/18/2013   Procedure: LEFT HEART CATHETERIZATION WITH CORONARY ANGIOGRAM;  Surgeon: Peter M Swaziland, MD;  Location: [facility] CATH LAB;  Service: Cardiovascular;  Laterality: N/A;    ALLERGIES: Allergies  Allergen Reactions   Reglan [Metoclopramide] Other (See Comments)    Dysphoric reaction   Metformin And Related Diarrhea   Ozempic (0.25 Or 0.5 Mg-Dose) [Semaglutide(0.25 Or 0.5mg -Dos)] Nausea And Vomiting    FAMILY HISTORY:  Family History  Problem Relation Age of Onset   Heart disease Mother    Diabetes Mother    Diabetes Maternal Uncle    Diabetes Cousin     SOCIAL HISTORY: Social History    Socioeconomic History   Marital status: Single    Spouse name: Not on file   Number of children: 0   Years of education: 15.5   Highest education level: Some college, no degree  Occupational History   Occupation: Consulting civil engineer    Comment: [employer]   Occupation: disability  Tobacco Use   Smoking status: Never   Smokeless tobacco: Never  Vaping Use   Vaping status: Never Used  Substance and Sexual Activity   Alcohol use: Yes    Comment: 10/31/2016 "a few beers q 6 months or so"   Drug use: No   Sexual activity: Yes  Other Topics Concern   Not on file  Social History Narrative   Financial assistance approved for 100% discount at  [facility] and has [employer] card per [employer]   09/01/2009.      Lives in [place] with mother and sister.            Social Drivers of Health   Financial Resource Strain: Medium Risk (01/31/2023)   Overall Financial Resource Strain (CARDIA)    Difficulty of Paying Living Expenses: Somewhat hard  Food Insecurity: No Food Insecurity (01/31/2023)   Hunger Vital Sign    Worried About Running Out of Food in the Last Year: Never true    Ran Out of Food in the Last Year: Never true  Transportation Needs: No Transportation Needs (01/31/2023)   PRAPARE - Administrator, Civil Service (Medical): No    Lack of Transportation (Non-Medical): No  Physical Activity: Sufficiently Active (01/31/2023)   Exercise Vital Sign    Days of Exercise per Week: 6 days    Minutes of Exercise per Session: 120 min  Stress: No Stress Concern Present (01/31/2023)   [employer] of Occupational Health - Occupational Stress Questionnaire    Feeling of Stress : Not at all  Social Connections: Socially Isolated (01/31/2023)   Social Connection and Isolation Panel [NHANES]    Frequency of Communication with Friends and Family: More than three times a week    Frequency of Social Gatherings with Friends and Family: More than three times a week    Attends Religious Services: Never     Database administrator or Organizations: No    Attends Banker Meetings: Never    Marital Status: Never married    MEDICATIONS:  Current Outpatient Medications  Medication Sig Dispense Refill   acidophilus (RISAQUAD) CAPS capsule Take 1 capsule by mouth daily.     Alpha-Lipoic Acid 600 MG CAPS Take 600 mg by mouth daily.     amLODipine (NORVASC) 10 MG tablet Take 10 mg by mouth at bedtime.     B-D UF III MINI PEN NEEDLES 31G X 5 MM MISC USE 1 NEEDLE FIVE TIMES DAILY 400 each 0   Blood Glucose Monitoring Suppl (ACCU-CHEK GUIDE ME) w/Device KIT Use to check blood sugar up to 3 times a day 1 kit 1   carvedilol (COREG) 25 MG tablet TAKE 1 TABLET BY MOUTH TWICE DAILY WITH A MEAL 180 tablet 0   chlorthalidone (HYGROTON) 25 MG tablet Take 25 mg by mouth daily. (Patient not taking: Reported on 01/31/2023)     cloNIDine (CATAPRES - DOSED IN MG/24 HR) 0.1 mg/24hr patch 0.1 mg once a week.     Cyanocobalamin (B-12) 500 MCG SUBL Place 500 mcg under the tongue daily.      diclofenac Sodium (VOLTAREN) 1 % GEL Apply 2 g topically 4 (four) times daily. 100 g 3   ELIQUIS 5 MG TABS tablet Take 1 tablet by mouth twice daily 180 tablet 0   famotidine (PEPCID) 20 MG tablet Take 1 tablet (20 mg total) by mouth 2 (two) times daily. (Patient taking differently: Take 20 mg by mouth 2 (two) times daily as needed for heartburn.) 30 tablet 0   fluticasone (FLONASE) 50 MCG/ACT nasal spray Place 2 sprays into both nostrils daily. (Patient taking differently: Place 2 sprays into both nostrils daily as needed for allergies.) 15.8 mL 2   furosemide (LASIX) 80 MG tablet Take 1 tablet by mouth twice daily (Patient taking differently: Take 80 mg by mouth 2 (two) times daily.) 180 tablet 0   gabapentin (NEURONTIN) 100 MG capsule TAKE 1 CAPSULE BY MOUTH  THREE TIMES DAILY 270 capsule 0   Glucagon (GVOKE HYPOPEN 1-PACK) 1 MG/0.2ML SOAJ Inject 1 mg into the skin as needed (low blood sugar with impaired consciousness).  0.4 mL 2   glucose blood (ACCU-CHEK GUIDE) test strip Check blood sugar up to 3 times a day 300 each 3   hydrALAZINE (APRESOLINE) 100 MG tablet Take 100 mg by mouth 3 (three) times daily.     insulin degludec (TRESIBA FLEXTOUCH) 200 UNIT/ML FlexTouch Pen INJECT 34 UNITS SUBCUTANEOUSLY ONCE DAILY (Patient taking differently: INJECT 36 UNITS SUBCUTANEOUSLY ONCE DAILY) 30 mL 3   Insulin Pen Needle (B-D UF III MINI PEN NEEDLES) 31G X 5 MM MISC 1 each by Other route See admin instructions. USE 4-5 TIMES DAILY 200 each 0   Lancet Devices (ACCU-CHEK SOFTCLIX) lancets Use to check blood sugars 5 times a day. Dx code:E11.9. Insulin dependent. (Patient taking differently: 1 each by Other route See admin instructions. Use to check blood sugars 5 times a day. Dx code:E11.9. Insulin dependent.) 6 each 3   lidocaine (HM LIDOCAINE PATCH) 4 % Place 1 patch onto the skin daily. 5 patch 0   lovastatin (MEVACOR) 20 MG tablet Take 1 tablet by mouth once daily 90 tablet 0   methocarbamol (ROBAXIN) 500 MG tablet Take 1 tablet (500 mg total) by mouth at bedtime as needed for muscle spasms. 15 tablet 0   NOVOLOG FLEXPEN 100 UNIT/ML FlexPen Inject 0-4 Units into the skin 3 (three) times daily with meals. Administer 0 units in the morning with breakfast, 0-4 units with lunch, and 0-4 units with dinner daily, maximum 30 units/day 30 mL 4   oxybutynin (DITROPAN) 5 MG tablet TAKE 1 TABLET BY MOUTH THREE TIMES DAILY 90 tablet 0   polyethylene glycol (MIRALAX / GLYCOLAX) 17 g packet Take 17 g by mouth 2 (two) times daily. (Patient taking differently: Take 17 g by mouth as needed for mild constipation.) 14 each 0   potassium chloride SA (KLOR-CON) 20 MEQ tablet Take 20 mEq by mouth 2 (two) times daily.     predniSONE (STERAPRED UNI-PAK 21 TAB) 5 MG (21) TBPK tablet Take as directed.  Do not take with any other nsaids.  Keep close eye on blood sugars 21 tablet 0   senna-docusate (SENOKOT-S) 8.6-50 MG tablet Take 1 tablet by mouth  daily. (Patient taking differently: Take 1 tablet by mouth as needed for mild constipation.) 90 tablet 3   sildenafil (VIAGRA) 50 MG tablet Take 1-2 tablets (50-100 mg total) by mouth as needed for erectile dysfunction. Take 1 hour before needed. Do not take more than 2 tablets at once. 20 tablet 0   tamsulosin (FLOMAX) 0.4 MG CAPS capsule Take 1 capsule by mouth at bedtime 90 capsule 0   tirzepatide (MOUNJARO) 15 MG/0.5ML Pen Inject 15 mg into the skin once a week. 6 mL 4   No current facility-administered medications for this visit.    PHYSICAL EXAM: Vitals:   02/08/23 1011 02/08/23 1012  BP: (!) 142/76 (!) 140/76  Pulse: 95   Resp: 20   SpO2: 96%   Weight: (!) 309 lb (140.2 kg)   Height: 6\' 2"  (1.88 m)     Body mass index is 39.67 kg/m.  Wt Readings from Last 3 Encounters:  02/08/23 (!) 309 lb (140.2 kg)  01/31/23 (!) 314 lb (142.4 kg)  12/06/22 (!) 302 lb (137 kg)    General: Well developed, well nourished male in no apparent distress.  HEENT: AT/[place], no external lesions.  Eyes: Conjunctiva clear and no icterus. Neck: Neck supple  Lungs: Respirations not labored Neurologic: Alert, oriented, normal speech Extremities / Skin: Dry. Rt AKA. Psychiatric: Does not appear depressed or anxious  Diabetic Foot Exam - Simple   No data filed    LABS Reviewed Lab Results  Component Value Date   HGBA1C 5.2 12/06/2022   HGBA1C 5.0 08/28/2022   HGBA1C 5.2 05/09/2022   Lab Results  Component Value Date   FRUCTOSAMINE 271 08/28/2022   FRUCTOSAMINE 299 (H) 05/09/2022   FRUCTOSAMINE 294 (H) 08/23/2021   Lab Results  Component Value Date   CHOL 119 12/30/2021   HDL 38.10 (L) 12/30/2021   LDLCALC 60 12/30/2021   TRIG 104.0 12/30/2021   CHOLHDL 3 12/30/2021   Lab Results  Component Value Date   MICRALBCREAT 1,528.4 (H) 02/15/2017   MICRALBCREAT 818.2 (H) 08/14/2013   Lab Results  Component Value Date   CREATININE 3.13 (H) 02/07/2023   Lab Results  Component Value  Date   GFR 28.51 (L) 04/15/2020    ASSESSMENT / PLAN  1. Insulin dependent type 2 diabetes mellitus, controlled (HCC)     Diabetes Mellitus type 2, complicated by peripheral neuropathy status post right BKA, CKD. - Diabetic status / severity: Controlled  Lab Results  Component Value Date   HGBA1C 5.2 12/06/2022    - Hemoglobin A1c goal : <7%  GMI on CGM 6.5%.  He has occasional mild hypoglycemia after his exercise in the morning and in the afternoon.  Mild sporadic hyperglycemia also he is on steroid and has received 3 steroid injection in the right hand recently.  - Medications: Adjusted as follows.  Advised to decrease Tresiba from 36 to 32 units if he still gets hypoglycemia after exercise daily.   Continue Mounjaro 15 mg weekly.  Novolog 0-4 units with meals 2 times a day , may adjust based on meals and experience.  No NovoLog with breakfast.  - Home glucose testing: Continue CGM and check as needed. - Discussed/ Gave Hypoglycemia treatment plan.  He has Glucagon Emergency Kit prescribed.  Advised to use glucose tablet for correction of hypoglycemia.  # Consult : not required at this time.   # He has diabetic nephropathy, following with nephrology.   Last  Lab Results  Component Value Date   MICRALBCREAT 1,528.4 (H) 02/15/2017    # Foot check nightly, history of diabetic foot ulcer status post right AKA.  # Annual dilated diabetic eye exams.  Following with retina specialist, status post retinopathy unknown at this time.  - Diet: Eat reasonable portion sizes to promote a healthy weight - Life style / activity / exercise: Discussed.  He regularly workout 5-6 times a week.  2. Blood pressure  -  BP Readings from Last 1 Encounters:  02/08/23 (!) 140/76    - Control is in target. Blood pressure acceptable. - No change in current plans.  3. Lipid status / Hyperlipidemia - Last  Lab Results  Component Value Date   LDLCALC 60 12/30/2021   - Continue  lovastatin 20 mg daily.  Managed by primary care provider.  Diagnoses and all orders for this visit:  Insulin dependent type 2 diabetes mellitus, controlled (HCC)    DISPOSITION Follow up in clinic in 3 months suggested.  Asked to call our clinic in between the visits with any questions regarding blood sugar.   All questions answered and patient verbalized understanding of the plan.  [place]ra[place] Duquan Gillooly, MD [facility]  [address] [place], [place] [address] Phone # [phone]  At least part of this note was generated using voice recognition software. Inadvertent word errors may have occurred, which were not recognized during the proofreading process.

## 2023-02-09 DIAGNOSIS — I10 Essential (primary) hypertension: Secondary | ICD-10-CM | POA: Diagnosis not present

## 2023-02-20 ENCOUNTER — Encounter (HOSPITAL_COMMUNITY): Payer: Self-pay

## 2023-02-20 ENCOUNTER — Telehealth: Payer: Self-pay | Admitting: *Deleted

## 2023-02-20 NOTE — Telephone Encounter (Signed)
Call from patient states had a Cortisone injection a few weeks ago I his right elbow .  Has had a lot of swelling in the arm and fingers.  Called the Orthopedic office was given Prednisone.  It helped for a little.  Now the swelling has returned and  his hand and wrist are  swollen as well.  Has a throbbing , and aching pain.  Has an appointment with Ortho on tomorrow.  Requesting an appointment with doctor here to see what is going on with the swelling/  Offered an appointment for tomorrow unable to come.  Scheduled for an appointment on 02/22/2023 pm. Would like  call from the doctor here if possible.

## 2023-02-21 ENCOUNTER — Encounter: Payer: Self-pay | Admitting: Orthopaedic Surgery

## 2023-02-21 ENCOUNTER — Ambulatory Visit (INDEPENDENT_AMBULATORY_CARE_PROVIDER_SITE_OTHER): Payer: Medicare HMO | Admitting: Orthopaedic Surgery

## 2023-02-21 DIAGNOSIS — M25531 Pain in right wrist: Secondary | ICD-10-CM | POA: Diagnosis not present

## 2023-02-21 MED ORDER — TRAMADOL HCL 50 MG PO TABS: 50.0000 mg | ORAL_TABLET | Freq: Two times a day (BID) | ORAL | 1 refills | Status: DC | PRN

## 2023-02-21 MED ORDER — PREDNISONE 5 MG (21) PO TBPK: ORAL_TABLET | ORAL | 0 refills | Status: DC

## 2023-02-21 NOTE — Progress Notes (Signed)
Office Visit Note   Patient: [name]           Date of Birth: April 27, 1975           MRN: [id number] Visit Date: 02/21/2023              Requested by: Kathleen Lime, MD [address] [place],  [place] [address] PCP: Kathleen Lime, MD   Assessment & Plan: Visit Diagnoses:  1. Pain in right wrist     Plan: Impression is chronic right wrist pain.  Suspicion for infection is low.  Patient did get significant relief with the previous steroid pack.  We will refill this today.  We will also go ahead and order an MRI of the right wrist to assess for structural abnormalities.  He will follow-up with [place] once that has been completed.  Total face to face encounter time was greater than 25 minutes and over half of this time was spent in counseling and/or coordination of care.  Follow-Up Instructions: Return for f/u after MRI.   Orders:  No orders of the defined types were placed in this encounter.  Meds ordered this encounter  Medications   traMADol (ULTRAM) 50 MG tablet    Sig: Take 1 tablet (50 mg total) by mouth every 12 (twelve) hours as needed.    Dispense:  30 tablet    Refill:  1   predniSONE (STERAPRED UNI-PAK 21 TAB) 5 MG (21) TBPK tablet    Sig: Take as directed.  Do not take with any other nsaids.  Keep close eye on blood sugars    Dispense:  21 tablet    Refill:  0      Procedures: No procedures performed   Clinical Data: No additional findings.   Subjective: Chief Complaint  Patient presents with   Right Wrist - Pain    HPI patient is a 48 year old gentleman who comes in today with recurrent right wrist pain and swelling.  He was seen in our office initially on 01/30/2023 for de Quervain's tenosynovitis which was injected with cortisone.  On 02/03/2023, he had increased pain and swelling to the hand all the way to the elbow.  No fevers or chills at that time.  He was seen in our office on 02/06/2023 where he was diagnosed with a probable postinjection flare.  He  was started on a steroid pack which significantly helped until 02/19/2023.  He notes that his pain and swelling returned at that time.  He tells me he has been wearing his splint and has only been using his hand to use the mouse for school.  The pain he has is worse with any motion of the wrist and fingers.  He has been taking naproxen with minimal relief.  Review of Systems as detailed in HPI.  All others reviewed and are negative.   Objective: Vital Signs: There were no vitals taken for this visit.  Physical Exam well-developed and well-nourished gentleman in no acute distress.  Alert and oriented x 3.  Ortho Exam right hand exam: Moderate swelling of the hand into the fingers.  No skin changes.  He has increased pain with wrist range of motion as well as flexion extension of all of his fingers.  He has full sensation distally.  Specialty Comments:  No specialty comments available.  Imaging: No new imaging   PMFS History: Patient Active Problem List   Diagnosis Date Noted   Elbow strain, left, initial encounter 06/06/2022   Medial epicondylitis  of elbow, left 05/06/2022   Diabetic neuropathy (HCC) 01/24/2022   Trapezius strain, left, initial encounter 02/02/2021   Left shoulder pain 12/30/2020   Acute pulmonary edema (HCC)    Benign paroxysmal positional vertigo 09/14/2020   Anemia associated with chronic renal failure    Hemoglobinuria 12/12/2018   OSA (obstructive sleep apnea) 02/28/2018   Chronic diastolic heart failure (HCC) 02/02/2018   Preventative health care 08/15/2017   Erectile dysfunction 04/22/2017   History of pulmonary embolus (PE) 06/09/2016   Depression 05/04/2016   Hypogonadism in male 05/04/2016   Urinary retention 05/04/2016   Dyslipidemia 08/12/2014   Unilateral AKA, right (HCC) 12/26/2013   Hyperlipidemia 02/19/2013   S/P cardiac cath, 02/18/13, normal coronaries 02/19/2013   Chronic renal insufficiency, stage 3 (moderate) (HCC) 02/19/2013    Hypertension 07/24/2011   Long term current use of anticoagulant therapy 07/19/2011   History of DVT (deep vein thrombosis) 07/28/2009   Morbid obesity (HCC) 10/26/2005   Insulin dependent type 2 diabetes mellitus, controlled (HCC) 01/09/2002   Past Medical History:  Diagnosis Date   Acute venous embolism and thrombosis of deep vessels of proximal lower extremity (HCC) 07/19/2011   Anemia    Anginal pain (HCC)    pt denies   Anxiety    Chest pain, neg MI, normal coronaries by cath 02/18/2013   pt denies   CHF (congestive heart failure) (HCC)    Chronic renal disease, stage IV (HCC) 02/19/2013   Colon polyps    Depression    Diabetic ulcer of right foot (HCC)    DVT (deep venous thrombosis) (HCC) 09/2002   patient reports additional DVTs in '06 & '11 (unconfirmed)   ED (erectile dysfunction)    GERD (gastroesophageal reflux disease)    History of blood transfusion    "related to OR" (10/31/2016)   Hyperlipidemia 02/19/2013   Hypertension    Nausea & vomiting    "constant for the last couple weeks" (10/31/2016)   Nephrotic syndrome    Obesity    BMI 44, weight 346 pounds 01/30/14   OSA (obstructive sleep apnea) 02/28/2018   Mild obstructive sleep apnea with an AHI of 9.8/h but severe during REM sleep with an AHI of 33.8/h.  Oxygen saturations dropped to 86% and there was moderate snoring   Peripheral edema    Pneumonia    Prosthesis adjustment 08/17/2016   Pulmonary embolism (HCC) 09/2002   treated with 6 months of warfarin   Pyelonephritis 02/02/2018   Type I diabetes mellitus (HCC) dx'd 2001   Urinary retention     Family History  Problem Relation Age of Onset   Heart disease Mother    Diabetes Mother    Diabetes Maternal Uncle    Diabetes Cousin     Past Surgical History:  Procedure Laterality Date   AMPUTATION Right 12/22/2013   Procedure: AMPUTATION BELOW KNEE;  Surgeon: Toni Arthurs, MD;  Location: [facility] OR;  Service: Orthopedics;  Laterality: Right;   AMPUTATION  Right 02/19/2014   Procedure: RIGHT ABOVE KNEE AMPUTATION ;  Surgeon: Toni Arthurs, MD;  Location: [facility] OR;  Service: Orthopedics;  Laterality: Right;   blood clot removal  2016   CARDIAC CATHETERIZATION  02/18/2013   normal coronaries   ESOPHAGOGASTRODUODENOSCOPY N/A 11/02/2016   Procedure: ESOPHAGOGASTRODUODENOSCOPY (EGD);  Surgeon: Iva Boop, MD;  Location[facility] ENDOSCOPY;  Service: Endoscopy;  Laterality: N/A;   I & D EXTREMITY Right 12/14/2013   Procedure: IRRIGATION AND DEBRIDEMENT RIGHT FOOT;  Surgeon: Verlee Rossetti, MD;  Location: [facility] OR;  Service: Orthopedics;  Laterality: Right;   INCISION AND DRAINAGE ABSCESS  2007; 2015   "back"   INCISION AND DRAINAGE OF WOUND Right 12/22/2013   Procedure: I&D RIGHT BUTTOCK;  Surgeon: Toni Arthurs, MD;  Location[facility] OR;  Service: Orthopedics;  Laterality: Right;   LEFT HEART CATHETERIZATION WITH CORONARY ANGIOGRAM N/A 02/18/2013   Procedure: LEFT HEART CATHETERIZATION WITH CORONARY ANGIOGRAM;  Surgeon: Peter M Swaziland, MD;  Location: [facility] CATH LAB;  Service: Cardiovascular;  Laterality: N/A;   Social History   Occupational History   Occupation: student    Comment: [employer]   Occupation: disability  Tobacco Use   Smoking status: Never   Smokeless tobacco: Never  Vaping Use   Vaping status: Never Used  Substance and Sexual Activity   Alcohol use: Yes    Comment: 10/31/2016 "a few beers q 6 months or so"   Drug use: No   Sexual activity: Yes

## 2023-02-22 ENCOUNTER — Other Ambulatory Visit: Payer: Self-pay | Admitting: Orthopaedic Surgery

## 2023-02-22 ENCOUNTER — Observation Stay (HOSPITAL_COMMUNITY)
Admission: EM | Admit: 2023-02-22 | Discharge: 2023-02-26 | Disposition: A | Discharge status: Home / Self Care | Payer: Medicare HMO | Attending: Emergency Medicine | Admitting: Emergency Medicine

## 2023-02-22 ENCOUNTER — Ambulatory Visit: Payer: Medicare HMO | Admitting: Student

## 2023-02-22 ENCOUNTER — Emergency Department (HOSPITAL_COMMUNITY)
Admit: 2023-02-22 | Discharge: 2023-02-22 | Disposition: A | Discharge status: Home / Self Care | Payer: Medicare HMO | Attending: Emergency Medicine

## 2023-02-22 ENCOUNTER — Other Ambulatory Visit: Payer: Self-pay

## 2023-02-22 ENCOUNTER — Emergency Department (HOSPITAL_COMMUNITY): Payer: Medicare HMO

## 2023-02-22 ENCOUNTER — Encounter (HOSPITAL_COMMUNITY): Payer: Self-pay

## 2023-02-22 VITALS — BP 143/78 | HR 93

## 2023-02-22 DIAGNOSIS — I5032 Chronic diastolic (congestive) heart failure: Secondary | ICD-10-CM | POA: Diagnosis not present

## 2023-02-22 DIAGNOSIS — M25531 Pain in right wrist: Secondary | ICD-10-CM | POA: Insufficient documentation

## 2023-02-22 DIAGNOSIS — M1990 Unspecified osteoarthritis, unspecified site: Secondary | ICD-10-CM | POA: Diagnosis present

## 2023-02-22 DIAGNOSIS — M109 Gout, unspecified: Secondary | ICD-10-CM

## 2023-02-22 DIAGNOSIS — M65941 Unspecified synovitis and tenosynovitis, right hand: Secondary | ICD-10-CM | POA: Diagnosis not present

## 2023-02-22 DIAGNOSIS — S63001A Unspecified subluxation of right wrist and hand, initial encounter: Secondary | ICD-10-CM | POA: Diagnosis not present

## 2023-02-22 DIAGNOSIS — R2231 Localized swelling, mass and lump, right upper limb: Secondary | ICD-10-CM | POA: Diagnosis present

## 2023-02-22 DIAGNOSIS — E1122 Type 2 diabetes mellitus with diabetic chronic kidney disease: Secondary | ICD-10-CM | POA: Insufficient documentation

## 2023-02-22 DIAGNOSIS — K5900 Constipation, unspecified: Secondary | ICD-10-CM | POA: Diagnosis not present

## 2023-02-22 DIAGNOSIS — N184 Chronic kidney disease, stage 4 (severe): Secondary | ICD-10-CM | POA: Insufficient documentation

## 2023-02-22 DIAGNOSIS — R7982 Elevated C-reactive protein (CRP): Secondary | ICD-10-CM | POA: Diagnosis not present

## 2023-02-22 DIAGNOSIS — M25441 Effusion, right hand: Secondary | ICD-10-CM | POA: Diagnosis not present

## 2023-02-22 DIAGNOSIS — R339 Retention of urine, unspecified: Secondary | ICD-10-CM | POA: Diagnosis present

## 2023-02-22 DIAGNOSIS — I130 Hypertensive heart and chronic kidney disease with heart failure and stage 1 through stage 4 chronic kidney disease, or unspecified chronic kidney disease: Secondary | ICD-10-CM | POA: Insufficient documentation

## 2023-02-22 DIAGNOSIS — Z86711 Personal history of pulmonary embolism: Secondary | ICD-10-CM | POA: Insufficient documentation

## 2023-02-22 DIAGNOSIS — M10031 Idiopathic gout, right wrist: Secondary | ICD-10-CM | POA: Diagnosis not present

## 2023-02-22 DIAGNOSIS — Z89611 Acquired absence of right leg above knee: Secondary | ICD-10-CM | POA: Insufficient documentation

## 2023-02-22 DIAGNOSIS — Z794 Long term (current) use of insulin: Secondary | ICD-10-CM | POA: Diagnosis not present

## 2023-02-22 DIAGNOSIS — Z86718 Personal history of other venous thrombosis and embolism: Secondary | ICD-10-CM | POA: Diagnosis not present

## 2023-02-22 DIAGNOSIS — M25431 Effusion, right wrist: Principal | ICD-10-CM

## 2023-02-22 DIAGNOSIS — N400 Enlarged prostate without lower urinary tract symptoms: Secondary | ICD-10-CM | POA: Diagnosis not present

## 2023-02-22 DIAGNOSIS — Z7901 Long term (current) use of anticoagulants: Secondary | ICD-10-CM | POA: Diagnosis not present

## 2023-02-22 DIAGNOSIS — M65931 Unspecified synovitis and tenosynovitis, right forearm: Secondary | ICD-10-CM | POA: Insufficient documentation

## 2023-02-22 DIAGNOSIS — G8929 Other chronic pain: Secondary | ICD-10-CM | POA: Diagnosis not present

## 2023-02-22 DIAGNOSIS — Z79899 Other long term (current) drug therapy: Secondary | ICD-10-CM | POA: Insufficient documentation

## 2023-02-22 DIAGNOSIS — M10431 Other secondary gout, right wrist: Secondary | ICD-10-CM

## 2023-02-22 DIAGNOSIS — R600 Localized edema: Secondary | ICD-10-CM | POA: Diagnosis not present

## 2023-02-22 DIAGNOSIS — M064 Inflammatory polyarthropathy: Secondary | ICD-10-CM | POA: Diagnosis not present

## 2023-02-22 DIAGNOSIS — E1140 Type 2 diabetes mellitus with diabetic neuropathy, unspecified: Secondary | ICD-10-CM | POA: Insufficient documentation

## 2023-02-22 DIAGNOSIS — E785 Hyperlipidemia, unspecified: Secondary | ICD-10-CM | POA: Diagnosis not present

## 2023-02-22 DIAGNOSIS — N3281 Overactive bladder: Secondary | ICD-10-CM | POA: Diagnosis not present

## 2023-02-22 DIAGNOSIS — R609 Edema, unspecified: Secondary | ICD-10-CM | POA: Diagnosis not present

## 2023-02-22 DIAGNOSIS — I5033 Acute on chronic diastolic (congestive) heart failure: Secondary | ICD-10-CM | POA: Diagnosis present

## 2023-02-22 DIAGNOSIS — E119 Type 2 diabetes mellitus without complications: Secondary | ICD-10-CM

## 2023-02-22 DIAGNOSIS — M7989 Other specified soft tissue disorders: Secondary | ICD-10-CM | POA: Diagnosis not present

## 2023-02-22 LAB — COMPREHENSIVE METABOLIC PANEL
ALT: 13 U/L (ref 0–44)
AST: 12 U/L — ABNORMAL LOW (ref 15–41)
Albumin: 3 g/dL — ABNORMAL LOW (ref 3.5–5.0)
Alkaline Phosphatase: 63 U/L (ref 38–126)
Anion gap: 15 (ref 5–15)
BUN: 80 mg/dL — ABNORMAL HIGH (ref 6–20)
CO2: 21 mmol/L — ABNORMAL LOW (ref 22–32)
Calcium: 9.7 mg/dL (ref 8.9–10.3)
Chloride: 101 mmol/L (ref 98–111)
Creatinine, Ser: 3.64 mg/dL — ABNORMAL HIGH (ref 0.61–1.24)
GFR, Estimated: 20 mL/min — ABNORMAL LOW (ref >60)
Glucose, Bld: 89 mg/dL (ref 70–99)
Potassium: 4.5 mmol/L (ref 3.5–5.1)
Sodium: 137 mmol/L (ref 135–145)
Total Bilirubin: 1.2 mg/dL (ref 0.0–1.2)
Total Protein: 7.8 g/dL (ref 6.5–8.1)

## 2023-02-22 LAB — CBC WITH DIFFERENTIAL/PLATELET
Abs Immature Granulocytes: 0.04 10*3/uL (ref 0.00–0.07)
Basophils Absolute: 0 10*3/uL (ref 0.0–0.1)
Basophils Relative: 0 %
Eosinophils Absolute: 0 10*3/uL (ref 0.0–0.5)
Eosinophils Relative: 0 %
HCT: 34.1 % — ABNORMAL LOW (ref 39.0–52.0)
Hemoglobin: 11 g/dL — ABNORMAL LOW (ref 13.0–17.0)
Immature Granulocytes: 0 %
Lymphocytes Relative: 4 %
Lymphs Abs: 0.5 10*3/uL — ABNORMAL LOW (ref 0.7–4.0)
MCH: 28.6 pg (ref 26.0–34.0)
MCHC: 32.3 g/dL (ref 30.0–36.0)
MCV: 88.8 fL (ref 80.0–100.0)
Monocytes Absolute: 0.8 10*3/uL (ref 0.1–1.0)
Monocytes Relative: 7 %
Neutro Abs: 10.8 10*3/uL — ABNORMAL HIGH (ref 1.7–7.7)
Neutrophils Relative %: 89 %
Platelets: 277 10*3/uL (ref 150–400)
RBC: 3.84 MIL/uL — ABNORMAL LOW (ref 4.22–5.81)
RDW: 14.6 % (ref 11.5–15.5)
WBC: 12.1 10*3/uL — ABNORMAL HIGH (ref 4.0–10.5)
nRBC: 0 % (ref 0.0–0.2)

## 2023-02-22 NOTE — Progress Notes (Signed)
Subjective:   Patient ID: [name] male   DOB: [birth year] 48 y.o.   MRN: [id number]  HPI: [name] is a 48 y.o. male with past medical history outlined below here for worsening right wrist swelling and pain following a  steroid injection for de Quervain's tenosynovitis on 02/03/2023. He was seen by his orthopedic surgery Dr. Roda Shutters yesterday for post injection swelling, at that time suspicion for infection was low. He was felt to be having a postinjection flare and was started on oral steroids. Since starting prednisone, swelling had gotten much worse. He is having significant pain and he is unable to bend his wrist.   Past Medical History:  Diagnosis Date   Acute venous embolism and thrombosis of deep vessels of proximal lower extremity (HCC) 07/19/2011   Anemia    Anginal pain (HCC)    pt denies   Anxiety    Chest pain, neg MI, normal coronaries by cath 02/18/2013   pt denies   CHF (congestive heart failure) (HCC)    Chronic renal disease, stage IV (HCC) 02/19/2013   Colon polyps    Depression    Diabetic ulcer of right foot (HCC)    DVT (deep venous thrombosis) (HCC) 09/2002   patient reports additional DVTs in '06 & '11 (unconfirmed)   ED (erectile dysfunction)    GERD (gastroesophageal reflux disease)    History of blood transfusion    "related to OR" (10/31/2016)   Hyperlipidemia 02/19/2013   Hypertension    Nausea & vomiting    "constant for the last couple weeks" (10/31/2016)   Nephrotic syndrome    Obesity    BMI 44, weight 346 pounds 01/30/14   OSA (obstructive sleep apnea) 02/28/2018   Mild obstructive sleep apnea with an AHI of 9.8/h but severe during REM sleep with an AHI of 33.8/h.  Oxygen saturations dropped to 86% and there was moderate snoring   Peripheral edema    Pneumonia    Prosthesis adjustment 08/17/2016   Pulmonary embolism (HCC) 09/2002   treated with 6 months of warfarin   Pyelonephritis 02/02/2018   Type I diabetes mellitus (HCC) dx'd 2001    Urinary retention    Current Outpatient Medications  Medication Sig Dispense Refill   acidophilus (RISAQUAD) CAPS capsule Take 1 capsule by mouth daily.     Alpha-Lipoic Acid 600 MG CAPS Take 600 mg by mouth daily.     amLODipine (NORVASC) 10 MG tablet Take 10 mg by mouth at bedtime.     B-D UF III MINI PEN NEEDLES 31G X 5 MM MISC USE 1 NEEDLE FIVE TIMES DAILY 400 each 0   Blood Glucose Monitoring Suppl (ACCU-CHEK GUIDE ME) w/Device KIT Use to check blood sugar up to 3 times a day 1 kit 1   carvedilol (COREG) 25 MG tablet TAKE 1 TABLET BY MOUTH TWICE DAILY WITH A MEAL 180 tablet 0   chlorthalidone (HYGROTON) 25 MG tablet Take 25 mg by mouth daily. (Patient not taking: Reported on 01/31/2023)     cloNIDine (CATAPRES - DOSED IN MG/24 HR) 0.1 mg/24hr patch 0.1 mg once a week.     Cyanocobalamin (B-12) 500 MCG SUBL Place 500 mcg under the tongue daily.      diclofenac Sodium (VOLTAREN) 1 % GEL Apply 2 g topically 4 (four) times daily. 100 g 3   ELIQUIS 5 MG TABS tablet Take 1 tablet by mouth twice daily 180 tablet 0   famotidine (PEPCID) 20 MG tablet Take 1 tablet (20 mg  total) by mouth 2 (two) times daily. (Patient taking differently: Take 20 mg by mouth 2 (two) times daily as needed for heartburn.) 30 tablet 0   fluticasone (FLONASE) 50 MCG/ACT nasal spray Place 2 sprays into both nostrils daily. (Patient taking differently: Place 2 sprays into both nostrils daily as needed for allergies.) 15.8 mL 2   furosemide (LASIX) 80 MG tablet Take 1 tablet by mouth twice daily (Patient taking differently: Take 80 mg by mouth 2 (two) times daily.) 180 tablet 0   gabapentin (NEURONTIN) 100 MG capsule TAKE 1 CAPSULE BY MOUTH THREE TIMES DAILY 270 capsule 0   Glucagon (GVOKE HYPOPEN 1-PACK) 1 MG/0.2ML SOAJ Inject 1 mg into the skin as needed (low blood sugar with impaired consciousness). 0.4 mL 2   glucose blood (ACCU-CHEK GUIDE) test strip Check blood sugar up to 3 times a day 300 each 3   hydrALAZINE  (APRESOLINE) 100 MG tablet Take 100 mg by mouth 3 (three) times daily.     insulin degludec (TRESIBA FLEXTOUCH) 200 UNIT/ML FlexTouch Pen INJECT 34 UNITS SUBCUTANEOUSLY ONCE DAILY (Patient taking differently: INJECT 36 UNITS SUBCUTANEOUSLY ONCE DAILY) 30 mL 3   Insulin Pen Needle (B-D UF III MINI PEN NEEDLES) 31G X 5 MM MISC 1 each by Other route See admin instructions. USE 4-5 TIMES DAILY 200 each 0   Lancet Devices (ACCU-CHEK SOFTCLIX) lancets Use to check blood sugars 5 times a day. Dx code:E11.9. Insulin dependent. (Patient taking differently: 1 each by Other route See admin instructions. Use to check blood sugars 5 times a day. Dx code:E11.9. Insulin dependent.) 6 each 3   lidocaine (HM LIDOCAINE PATCH) 4 % Place 1 patch onto the skin daily. 5 patch 0   lovastatin (MEVACOR) 20 MG tablet Take 1 tablet by mouth once daily 90 tablet 0   methocarbamol (ROBAXIN) 500 MG tablet Take 1 tablet (500 mg total) by mouth at bedtime as needed for muscle spasms. 15 tablet 0   NOVOLOG FLEXPEN 100 UNIT/ML FlexPen Inject 0-4 Units into the skin 3 (three) times daily with meals. Administer 0 units in the morning with breakfast, 0-4 units with lunch, and 0-4 units with dinner daily, maximum 30 units/day 30 mL 4   oxybutynin (DITROPAN) 5 MG tablet TAKE 1 TABLET BY MOUTH THREE TIMES DAILY 90 tablet 0   polyethylene glycol (MIRALAX / GLYCOLAX) 17 g packet Take 17 g by mouth 2 (two) times daily. (Patient taking differently: Take 17 g by mouth as needed for mild constipation.) 14 each 0   potassium chloride SA (KLOR-CON) 20 MEQ tablet Take 20 mEq by mouth 2 (two) times daily.     predniSONE (STERAPRED UNI-PAK 21 TAB) 5 MG (21) TBPK tablet Take as directed.  Do not take with any other nsaids.  Keep close eye on blood sugars 21 tablet 0   senna-docusate (SENOKOT-S) 8.6-50 MG tablet Take 1 tablet by mouth daily. (Patient taking differently: Take 1 tablet by mouth as needed for mild constipation.) 90 tablet 3   sildenafil  (VIAGRA) 50 MG tablet Take 1-2 tablets (50-100 mg total) by mouth as needed for erectile dysfunction. Take 1 hour before needed. Do not take more than 2 tablets at once. 20 tablet 0   tamsulosin (FLOMAX) 0.4 MG CAPS capsule Take 1 capsule by mouth at bedtime 90 capsule 0   tirzepatide (MOUNJARO) 15 MG/0.5ML Pen Inject 15 mg into the skin once a week. 6 mL 4   traMADol (ULTRAM) 50 MG tablet Take 1 tablet (50  mg total) by mouth every 12 (twelve) hours as needed. 30 tablet 1   No current facility-administered medications for this visit.   Family History  Problem Relation Age of Onset   Heart disease Mother    Diabetes Mother    Diabetes Maternal Uncle    Diabetes Cousin    Social History   Socioeconomic History   Marital status: Single    Spouse name: Not on file   Number of children: 0   Years of education: 15.5   Highest education level: Some college, no degree  Occupational History   Occupation: Consulting civil engineer    Comment: [employer]   Occupation: disability  Tobacco Use   Smoking status: Never   Smokeless tobacco: Never  Vaping Use   Vaping status: Never Used  Substance and Sexual Activity   Alcohol use: Yes    Comment: 10/31/2016 "a few beers q 6 months or so"   Drug use: No   Sexual activity: Yes  Other Topics Concern   Not on file  Social History Narrative   Financial assistance approved for 100% discount at [facility] and has [facility] card per [employer]   09/01/2009.      Lives in [place] with mother and sister.            Social Drivers of Health   Financial Resource Strain: Medium Risk (01/31/2023)   Overall Financial Resource Strain (CARDIA)    Difficulty of Paying Living Expenses: Somewhat hard  Food Insecurity: No Food Insecurity (01/31/2023)   Hunger Vital Sign    Worried About Running Out of Food in the Last Year: Never true    Ran Out of Food in the Last Year: Never true  Transportation Needs: No Transportation Needs (01/31/2023)   PRAPARE - Scientist, research (physical sciences) (Medical): No    Lack of Transportation (Non-Medical): No  Physical Activity: Sufficiently Active (01/31/2023)   Exercise Vital Sign    Days of Exercise per Week: 6 days    Minutes of Exercise per Session: 120 min  Stress: No Stress Concern Present (01/31/2023)   [employer] of Occupational Health - Occupational Stress Questionnaire    Feeling of Stress : Not at all  Social Connections: Socially Isolated (01/31/2023)   Social Connection and Isolation Panel [NHANES]    Frequency of Communication with Friends and Family: More than three times a week    Frequency of Social Gatherings with Friends and Family: More than three times a week    Attends Religious Services: Never    Database administrator or Organizations: No    Attends Banker Meetings: Never    Marital Status: Never married     Objective:  Physical Exam:  Vitals:   02/22/23 1543  BP: (!) 143/78  Pulse: 93  SpO2: 97%    Constitutional: Obese, generally well appearing Extremities: right wrist with swelling, warm and very tender to touch. Limited ROM, unable to extend fingers, also pain over his lateral epicondyle radiating up to his shoulder.       Assessment & Plan:   Pain and swelling of right wrist I am concerned for septic arthritis with recent steroid injection; exam with swelling, erythema, warmth to touch. Very tender and limited ROM. Symptoms worsening since starting oral prednisone yesterday. Had MRI ordered by ortho but this is not scheduled for another week. At this point I do think we need to rule out infection and admit for empiric IV antibiotics. I attempted direct  admission but the hospital is full and there are no beds and no anticipated discharges. I have sent him to the ED where hopefully he can get urgent imaging and inpatient team can discuss with his on call ortho provider.

## 2023-02-22 NOTE — Assessment & Plan Note (Addendum)
I am concerned for septic arthritis with recent steroid injection; exam with swelling, erythema, warmth to touch. Very tender and limited ROM. Symptoms worsening since starting oral prednisone yesterday. Had MRI ordered by ortho but this is not scheduled for another week. At this point I do think we need to rule out infection and admit for empiric IV antibiotics. I attempted direct admission but the hospital is full and there are no beds and no anticipated discharges. I have sent him to the ED where hopefully he can get urgent imaging and inpatient team can discuss with his on call ortho provider.

## 2023-02-22 NOTE — Progress Notes (Signed)
Right upper extremity venous duplex has been completed. Preliminary results can be found in CV Proc through chart review.  Results were given to Arabella Merles PA.  02/22/23 6:02 PM Olen Cordial RVT

## 2023-02-22 NOTE — ED Provider Triage Note (Signed)
Emergency Medicine Provider Triage Evaluation Note  [name] , a 48 y.o. male  was evaluated in triage.  Pt complains of pain and swelling to the right wrist that started about a week ago.  Reports over the past couple days pain and swelling have been worsening.  Reports difficulty with moving the wrist and fingers.  Review of Systems  Positive: As above Negative: As above  Physical Exam  BP 134/75 (BP Location: Right Arm)   Pulse 86   Temp 98.5 F (36.9 C) (Oral)   Resp 20   Ht 6\' 2"  (1.88 m)   Wt (!) 140.2 kg   SpO2 93%   BMI 39.67 kg/m  Gen:   Awake, no distress   Resp:  Normal effort  MSK:   Moves extremities without difficulty  Other:  Significant edema to the right wrist and hand.  Able to flex Fingers, but with pain.  2+ brachial pulse.  Unable to palpate pedal pulse due to edema and pain.  Medical Decision Making  Medically screening exam initiated at 5:34 PM.  Appropriate orders placed.  [name] was informed that the remainder of the evaluation will be completed by another provider, this initial triage assessment does not replace that evaluation, and the importance of remaining in the ED until their evaluation is complete.     Arabella Merles, PA-C 02/22/23 1736

## 2023-02-22 NOTE — ED Triage Notes (Signed)
Patient had pain and swelling to right wrist and had a cortisone shot and was placed on steriods.  Patient reports pain is worse now and swelling is worse. REports unable to move the arm.  Pain goes from finger tips to upper arm.  +brachial pulse

## 2023-02-22 NOTE — Progress Notes (Addendum)
Patient discussed with internal medicine team.  Reviewed images demonstrating significant soft tissue swelling of the right wrist/hand relative to the left.  Recommending stat MRI of the right wrist to rule out closed space infection.  Dr. Roda Shutters is aware of patient and current situation.  Will continue to follow.  Consider possible aspiration based on MRI results but recommended holding off on IV antibiotics until after any aspiration to assist with optimal culture results.  Orders placed for uric acid, ESR, CRP per Dr Warren Danes request.

## 2023-02-23 ENCOUNTER — Emergency Department (HOSPITAL_COMMUNITY): Payer: Medicare HMO

## 2023-02-23 DIAGNOSIS — M25531 Pain in right wrist: Secondary | ICD-10-CM | POA: Diagnosis not present

## 2023-02-23 DIAGNOSIS — M064 Inflammatory polyarthropathy: Secondary | ICD-10-CM | POA: Diagnosis not present

## 2023-02-23 DIAGNOSIS — M1990 Unspecified osteoarthritis, unspecified site: Secondary | ICD-10-CM | POA: Diagnosis not present

## 2023-02-23 DIAGNOSIS — Z794 Long term (current) use of insulin: Secondary | ICD-10-CM | POA: Diagnosis not present

## 2023-02-23 DIAGNOSIS — E1122 Type 2 diabetes mellitus with diabetic chronic kidney disease: Secondary | ICD-10-CM | POA: Diagnosis not present

## 2023-02-23 DIAGNOSIS — M25431 Effusion, right wrist: Secondary | ICD-10-CM

## 2023-02-23 DIAGNOSIS — R600 Localized edema: Secondary | ICD-10-CM | POA: Diagnosis not present

## 2023-02-23 DIAGNOSIS — M109 Gout, unspecified: Secondary | ICD-10-CM | POA: Diagnosis not present

## 2023-02-23 DIAGNOSIS — M65941 Unspecified synovitis and tenosynovitis, right hand: Secondary | ICD-10-CM | POA: Diagnosis not present

## 2023-02-23 DIAGNOSIS — S63001A Unspecified subluxation of right wrist and hand, initial encounter: Secondary | ICD-10-CM | POA: Diagnosis not present

## 2023-02-23 DIAGNOSIS — N184 Chronic kidney disease, stage 4 (severe): Secondary | ICD-10-CM | POA: Diagnosis not present

## 2023-02-23 DIAGNOSIS — M25441 Effusion, right hand: Secondary | ICD-10-CM | POA: Diagnosis not present

## 2023-02-23 LAB — CBG MONITORING, ED
Glucose-Capillary: 197 mg/dL — ABNORMAL HIGH (ref 70–99)
Glucose-Capillary: 216 mg/dL — ABNORMAL HIGH (ref 70–99)

## 2023-02-23 LAB — HIV ANTIBODY (ROUTINE TESTING W REFLEX): HIV Screen 4th Generation wRfx: NONREACTIVE

## 2023-02-23 LAB — GLUCOSE, CAPILLARY
Glucose-Capillary: 212 mg/dL — ABNORMAL HIGH (ref 70–99)
Glucose-Capillary: 310 mg/dL — ABNORMAL HIGH (ref 70–99)

## 2023-02-23 LAB — C-REACTIVE PROTEIN: CRP: 20.4 mg/dL — ABNORMAL HIGH (ref <1.0)

## 2023-02-23 LAB — SEDIMENTATION RATE: Sed Rate: 117 mm/h — ABNORMAL HIGH (ref 0–16)

## 2023-02-23 LAB — URIC ACID: Uric Acid, Serum: 14.8 mg/dL — ABNORMAL HIGH (ref 3.7–8.6)

## 2023-02-23 MED ORDER — INSULIN GLARGINE-YFGN 100 UNIT/ML ~~LOC~~ SOLN
36.0000 [IU] | Freq: Every day | SUBCUTANEOUS | Status: DC
  Administered 2023-02-23 – 2023-02-24 (×2): 36 [IU] via SUBCUTANEOUS
  Filled 2023-02-23 (×3): qty 0.36

## 2023-02-23 MED ORDER — INSULIN ASPART 100 UNIT/ML IJ SOLN
0.0000 [IU] | Freq: Every day | INTRAMUSCULAR | Status: DC
  Administered 2023-02-24 (×2): 4 [IU] via SUBCUTANEOUS
  Administered 2023-02-25: 3 [IU] via SUBCUTANEOUS

## 2023-02-23 MED ORDER — LIDOCAINE-EPINEPHRINE (PF) 2 %-1:200000 IJ SOLN
20.0000 mL | Freq: Once | INTRAMUSCULAR | Status: AC
  Administered 2023-02-23: 20 mL
  Filled 2023-02-23: qty 20

## 2023-02-23 MED ORDER — HYDROMORPHONE HCL 2 MG PO TABS
1.0000 mg | ORAL_TABLET | ORAL | Status: DC | PRN
  Administered 2023-02-23 – 2023-02-24 (×3): 1 mg via ORAL
  Filled 2023-02-23 (×3): qty 1

## 2023-02-23 MED ORDER — AMLODIPINE BESYLATE 10 MG PO TABS
10.0000 mg | ORAL_TABLET | Freq: Every day | ORAL | Status: DC
  Administered 2023-02-24 – 2023-02-25 (×2): 10 mg via ORAL
  Filled 2023-02-23 (×2): qty 1

## 2023-02-23 MED ORDER — TAMSULOSIN HCL 0.4 MG PO CAPS
0.4000 mg | ORAL_CAPSULE | Freq: Every day | ORAL | Status: DC
  Administered 2023-02-24 – 2023-02-25 (×3): 0.4 mg via ORAL
  Filled 2023-02-23 (×3): qty 1

## 2023-02-23 MED ORDER — DICLOFENAC SODIUM 1 % EX GEL
2.0000 g | Freq: Four times a day (QID) | CUTANEOUS | Status: DC
  Administered 2023-02-23 – 2023-02-26 (×9): 2 g via TOPICAL
  Filled 2023-02-23: qty 100

## 2023-02-23 MED ORDER — INSULIN DEGLUDEC 200 UNIT/ML ~~LOC~~ SOPN: 36.0000 [IU] | PEN_INJECTOR | Freq: Every day | SUBCUTANEOUS | Status: DC

## 2023-02-23 MED ORDER — FUROSEMIDE 20 MG PO TABS
80.0000 mg | ORAL_TABLET | Freq: Two times a day (BID) | ORAL | Status: DC
  Administered 2023-02-23: 80 mg via ORAL
  Filled 2023-02-23: qty 4

## 2023-02-23 MED ORDER — CARVEDILOL 25 MG PO TABS
25.0000 mg | ORAL_TABLET | Freq: Two times a day (BID) | ORAL | Status: DC
  Administered 2023-02-23 – 2023-02-26 (×8): 25 mg via ORAL
  Filled 2023-02-23 (×3): qty 1
  Filled 2023-02-23: qty 2
  Filled 2023-02-23 (×4): qty 1

## 2023-02-23 MED ORDER — ACETAMINOPHEN 325 MG PO TABS
650.0000 mg | ORAL_TABLET | Freq: Four times a day (QID) | ORAL | Status: DC
  Administered 2023-02-23 – 2023-02-26 (×3): 650 mg via ORAL
  Filled 2023-02-23 (×7): qty 2

## 2023-02-23 MED ORDER — OXYBUTYNIN CHLORIDE 5 MG PO TABS
5.0000 mg | ORAL_TABLET | Freq: Three times a day (TID) | ORAL | Status: DC
  Administered 2023-02-23 – 2023-02-26 (×11): 5 mg via ORAL
  Filled 2023-02-23 (×12): qty 1

## 2023-02-23 MED ORDER — HEPARIN SODIUM (PORCINE) 5000 UNIT/ML IJ SOLN: 5000.0000 [IU] | Freq: Three times a day (TID) | INTRAMUSCULAR | Status: DC

## 2023-02-23 MED ORDER — FLUTICASONE PROPIONATE 50 MCG/ACT NA SUSP
2.0000 | Freq: Every day | NASAL | Status: DC
  Administered 2023-02-25: 2 via NASAL
  Filled 2023-02-23 (×2): qty 16

## 2023-02-23 MED ORDER — HYDROCODONE-ACETAMINOPHEN 5-325 MG PO TABS
1.0000 | ORAL_TABLET | Freq: Once | ORAL | Status: AC
  Administered 2023-02-23: 1 via ORAL
  Filled 2023-02-23: qty 1

## 2023-02-23 MED ORDER — POLYETHYLENE GLYCOL 3350 17 G PO PACK
17.0000 g | PACK | Freq: Every day | ORAL | Status: DC | PRN
  Filled 2023-02-23: qty 1

## 2023-02-23 MED ORDER — INSULIN ASPART 100 UNIT/ML IJ SOLN
0.0000 [IU] | Freq: Three times a day (TID) | INTRAMUSCULAR | Status: DC
  Administered 2023-02-23 (×2): 7 [IU] via SUBCUTANEOUS
  Administered 2023-02-23: 4 [IU] via SUBCUTANEOUS
  Administered 2023-02-24: 15 [IU] via SUBCUTANEOUS
  Administered 2023-02-24: 11 [IU] via SUBCUTANEOUS
  Administered 2023-02-24: 15 [IU] via SUBCUTANEOUS
  Administered 2023-02-25: 11 [IU] via SUBCUTANEOUS
  Administered 2023-02-25: 20 [IU] via SUBCUTANEOUS
  Administered 2023-02-25: 11 [IU] via SUBCUTANEOUS
  Administered 2023-02-26 (×3): 7 [IU] via SUBCUTANEOUS

## 2023-02-23 MED ORDER — APIXABAN 5 MG PO TABS
5.0000 mg | ORAL_TABLET | Freq: Two times a day (BID) | ORAL | Status: DC
  Administered 2023-02-24 – 2023-02-26 (×6): 5 mg via ORAL
  Filled 2023-02-23 (×6): qty 1

## 2023-02-23 MED ORDER — GABAPENTIN 100 MG PO CAPS
100.0000 mg | ORAL_CAPSULE | Freq: Two times a day (BID) | ORAL | Status: DC
  Administered 2023-02-23 – 2023-02-26 (×7): 100 mg via ORAL
  Filled 2023-02-23 (×7): qty 1

## 2023-02-23 MED ORDER — FUROSEMIDE 20 MG PO TABS: 40.0000 mg | ORAL_TABLET | Freq: Two times a day (BID) | ORAL | Status: DC

## 2023-02-23 MED ORDER — HYDRALAZINE HCL 50 MG PO TABS
100.0000 mg | ORAL_TABLET | Freq: Three times a day (TID) | ORAL | Status: DC
Start: 2023-02-23 — End: 2023-02-27
  Administered 2023-02-23 – 2023-02-26 (×11): 100 mg via ORAL
  Filled 2023-02-23 (×11): qty 2

## 2023-02-23 MED ORDER — POTASSIUM CHLORIDE CRYS ER 20 MEQ PO TBCR
20.0000 meq | EXTENDED_RELEASE_TABLET | Freq: Three times a day (TID) | ORAL | Status: DC
  Administered 2023-02-23 – 2023-02-26 (×11): 20 meq via ORAL
  Filled 2023-02-23 (×11): qty 1

## 2023-02-23 MED ORDER — PREDNISONE 10 MG PO TABS
40.0000 mg | ORAL_TABLET | Freq: Every day | ORAL | Status: DC
  Administered 2023-02-23 – 2023-02-24 (×2): 40 mg via ORAL
  Filled 2023-02-23: qty 2
  Filled 2023-02-23: qty 4

## 2023-02-23 MED ORDER — PRAVASTATIN SODIUM 10 MG PO TABS
10.0000 mg | ORAL_TABLET | Freq: Every day | ORAL | Status: DC
  Administered 2023-02-23 – 2023-02-25 (×3): 10 mg via ORAL
  Filled 2023-02-23 (×5): qty 1

## 2023-02-23 NOTE — H&P (Addendum)
Date: 02/23/2023               Patient Name:  [name] MRN: [id number]  DOB: 1975/02/21 Age / Sex: 48 y.o., male   PCP: Gust Rung, DO         Medical Service: Internal Medicine Teaching Service         Attending Physician: Dr. Dickie La, MD      First Contact: Dr. Faith Rogue, DO Pager [phone]    Second Contact: Dr. Champ Mungo, DO Pager [phone]         After Hours (After 5p/  First Contact Pager: [phone]  weekends / holidays): Second Contact Pager: [phone]   SUBJECTIVE   Chief Complaint: Right Wrist Pain   History of Present Illness:   [name] is a 48 yo M with PMH of T2DM on insulin, CKD stage 4, recurrent VTE on Eliquis, and R above knee amputation who is complaining of ongoing pain and swelling in the R wrist, elbow and shoulder.  His primary location of concern is the R wrist. He had outpatient steroid injection in the R wrist and L elbow on January 21st for de quervain's tenosynovitis and lateral epicondylitis. Shortly thereafter, he developed swelling and pain in the R wrist with radiation to the R elbow. He followed up with his orthopedist on January 28th and diagnosed with post injection flare/irritation that was treated with steroid taper and tramadol. He states that this treatment improved his condition. He finished steroids on Feb 2nd and had minimal pain/swelling until Feb 12th, when symptoms recurred. On the 12th, he saw his orthopedist again and was restarted on steroids and an MRI was ordered. He visited his primary care provider on the 13th and noted that his condition had not improved since restarting steroids the day before. He was encouraged to seek further evaluation in the ED to rule out a septic joint in the setting of ongoing pain and swelling after a procedure.  He denies fevers or chills. He states that this has never happened before. He denies trauma. There is no oberved drainage. No changes to the skin color. Not sexually active/ no new  partners. He reports a recent medicine change - resuming chlorthalidone at the end of January in setting of high blood pressures.  ED Course: Presented to ED on 2/13. Vital signs stable. CBC with WBCs 12.1. BMP with elevated creatinine that is stable and consistent with his CKD stage 4. Serum uric acid 14.8. Sed rate 117. CRP 20.4. Arthrocentesis was performed and very little fluid was collected, but early culture/stain reveals no organisms and few WBCs. MRI R wrist done and read pending. IMTS asked for admission for arthropathy with severe pain.   Past Medical History Past Medical History:  Diagnosis Date   Acute venous embolism and thrombosis of deep vessels of proximal lower extremity (HCC) 07/19/2011   Anemia    Anginal pain (HCC)    pt denies   Anxiety    Chest pain, neg MI, normal coronaries by cath 02/18/2013   pt denies   CHF (congestive heart failure) (HCC)    Chronic renal disease, stage IV (HCC) 02/19/2013   Colon polyps    Depression    Diabetic ulcer of right foot (HCC)    DVT (deep venous thrombosis) (HCC) 09/2002   patient reports additional DVTs in '06 & '11 (unconfirmed)   ED (erectile dysfunction)    GERD (gastroesophageal reflux disease)    History of blood transfusion    "  related to OR" (10/31/2016)   Hyperlipidemia 02/19/2013   Hypertension    Nausea & vomiting    "constant for the last couple weeks" (10/31/2016)   Nephrotic syndrome    Obesity    BMI 44, weight 346 pounds 01/30/14   OSA (obstructive sleep apnea) 02/28/2018   Mild obstructive sleep apnea with an AHI of 9.8/h but severe during REM sleep with an AHI of 33.8/h.  Oxygen saturations dropped to 86% and there was moderate snoring   Peripheral edema    Pneumonia    Prosthesis adjustment 08/17/2016   Pulmonary embolism (HCC) 09/2002   treated with 6 months of warfarin   Pyelonephritis 02/02/2018   Type I diabetes mellitus (HCC) dx'd 2001   Urinary retention      Meds:   Amlodipine 10 mg  qhs Coreg 25 BID Chorthalidone 25 qd Clonidine patch 0.1mg /24 hr patch Voltaren gel prn Eliquis 5 mg BID Flonase prn Lasix 80 mg BID Gabapentin 100 BID Glucagon injection prn Hydralazine 100 TID Triseba 36 daily  Lovastatin 20 mg qd Novolog sliding scale  Oxybutinin 5 TID Mirlax prn KCL 20 meq TID Prednisone  Senna prn Tamsulosin 0.4mg  qhs Mounjaro 15mg  injection weekly Tramadol 50 mg prn   Past Surgical History  Past Surgical History:  Procedure Laterality Date   AMPUTATION Right 12/22/2013   Procedure: AMPUTATION BELOW KNEE;  Surgeon: Toni Arthurs, MD;  Location: [facility] OR;  Service: Orthopedics;  Laterality: Right;   AMPUTATION Right 02/19/2014   Procedure: RIGHT ABOVE KNEE AMPUTATION ;  Surgeon: Toni Arthurs, MD;  Location: [facility] OR;  Service: Orthopedics;  Laterality: Right;   blood clot removal  2016   CARDIAC CATHETERIZATION  02/18/2013   normal coronaries   ESOPHAGOGASTRODUODENOSCOPY N/A 11/02/2016   Procedure: ESOPHAGOGASTRODUODENOSCOPY (EGD);  Surgeon: Iva Boop, MD;  Location[facility] ENDOSCOPY;  Service: Endoscopy;  Laterality: N/A;   I & D EXTREMITY Right 12/14/2013   Procedure: IRRIGATION AND DEBRIDEMENT RIGHT FOOT;  Surgeon: Verlee Rossetti, MD;  Location: [facility] OR;  Service: Orthopedics;  Laterality: Right;   INCISION AND DRAINAGE ABSCESS  2007; 2015   "back"   INCISION AND DRAINAGE OF WOUND Right 12/22/2013   Procedure: I&D RIGHT BUTTOCK;  Surgeon: Toni Arthurs, MD;  Location: [facility] OR;  Service: Orthopedics;  Laterality: Right;   LEFT HEART CATHETERIZATION WITH CORONARY ANGIOGRAM N/A 02/18/2013   Procedure: LEFT HEART CATHETERIZATION WITH CORONARY ANGIOGRAM;  Surgeon: Peter M Swaziland, MD;  Location: [facility] CATH LAB;  Service: Cardiovascular;  Laterality: N/A;    Social:  Lives With: Alone in Apt  Occupation: School for [employer]  Support: Good support In Siblings   Level of Function: Independent in all ADLs and iADLs  PCP: Carlynn Purl, DO  Substances: No  substance use, social drinker   Family History:  No family history of Gout, CPPD, or RA   Allergies: Allergies as of 02/22/2023 - Review Complete 02/22/2023  Allergen Reaction Noted   Reglan [metoclopramide] Other (See Comments) 11/01/2016   Metformin and related Diarrhea 08/19/2019   Ozempic (0.25 or 0.5 mg-dose) [semaglutide(0.25 or 0.5mg -dos)] Nausea And Vomiting 08/19/2019    Review of Systems: A complete ROS was negative except as per HPI.   OBJECTIVE:   Physical Exam: Blood pressure (!) 148/89, pulse 89, temperature 98.3 F (36.8 C), temperature source Oral, resp. rate 14, height 6\' 2"  (1.88 m), weight (!) 140.2 kg, SpO2 99%.  Constitutional: tired-appearing man laying in bed, in no acute distress HENT: normocephalic atraumatic, mucous membranes moist  Cardiovascular: regular rate and rhythm, no m/r/g Pulmonary/Chest: normal work of breathing on room air, lungs clear to auscultation bilaterally Abdominal: soft, non-tender, non-distended MSK: normal bulk and tone. A R AKA is noted. The R wrist is moderately swollen and warm to touch. There is trace swelling and warmth of the R elbow. The R wrist, elbow, and shoulder are tender and have severely decreased active ROM limited due to pain. Neurological: alert & oriented x 3 Skin: warm and dry  Labs: CBC    Component Value Date/Time   WBC 12.1 (H) 02/22/2023 1733   RBC 3.84 (L) 02/22/2023 1733   HGB 11.0 (L) 02/22/2023 1733   HGB 9.2 (L) 04/24/2018 1040   HCT 34.1 (L) 02/22/2023 1733   HCT 28.4 (L) 04/24/2018 1040   PLT 277 02/22/2023 1733   PLT 359 04/24/2018 1040   MCV 88.8 02/22/2023 1733   MCV 85 04/24/2018 1040   MCH 28.6 02/22/2023 1733   MCHC 32.3 02/22/2023 1733   RDW 14.6 02/22/2023 1733   RDW 14.1 04/24/2018 1040   LYMPHSABS 0.5 (L) 02/22/2023 1733   LYMPHSABS 1.7 04/24/2018 1040   MONOABS 0.8 02/22/2023 1733   EOSABS 0.0 02/22/2023 1733   EOSABS 0.3 04/24/2018 1040   BASOSABS 0.0 02/22/2023 1733    BASOSABS 0.0 04/24/2018 1040     CMP     Component Value Date/Time   NA 137 02/22/2023 1733   NA 137 06/03/2019 0948   K 4.5 02/22/2023 1733   CL 101 02/22/2023 1733   CO2 21 (L) 02/22/2023 1733   GLUCOSE 89 02/22/2023 1733   BUN 80 (H) 02/22/2023 1733   BUN 63 (H) 06/03/2019 0948   CREATININE 3.64 (H) 02/22/2023 1733   CREATININE 1.78 (H) 12/09/2013 1559   CALCIUM 9.7 02/22/2023 1733   CALCIUM 9.2 11/03/2022 0916   PROT 7.8 02/22/2023 1733   ALBUMIN 3.0 (L) 02/22/2023 1733   AST 12 (L) 02/22/2023 1733   ALT 13 02/22/2023 1733   ALKPHOS 63 02/22/2023 1733   BILITOT 1.2 02/22/2023 1733   GFRNONAA 20 (L) 02/22/2023 1733   GFRNONAA 48 (L) 12/09/2013 1559   GFRAA 16 (L) 10/06/2019 1337   GFRAA 55 (L) 12/09/2013 1559    Imaging: MRI R wrist read pending  ASSESSMENT & PLAN:   Assessment & Plan by Problem: Principal Problem:   Inflammatory arthropathy Active Problems:   Insulin dependent type 2 diabetes mellitus, controlled (HCC)   History of DVT (deep vein thrombosis)   CKD (chronic kidney disease) stage 4, GFR 15-29 ml/min (HCC)   Chronic diastolic heart failure (HCC)   Dyslipidemia   Urinary retention   Diabetic neuropathy (HCC)   Clara Smolen is a 48 y.o. man with a history of T2DM on insulin, tenosynovitis with recent steroid injections, HFpEF, CKD stage 4, recurrent VTE on Eliquis, and R above knee amputation who presented with R wrist pain and admitted for inflammatory arthropathy.  Inflammatory Arthropathy Rule out Septic Arthropathy Hx of Gout Pt admitted for ongoing polyarthropathy of the R UE joints that was initially R wrist pain after a steroid injection in late January. On exam today, patient has effusion at the right wrist and right elbow with tenderness as well with severely decreased range of motion. His improvement with the fist dose of steroids as outpatient supports diagnosis of an inflammatory arthropathy, as does recurrence of symptoms after  chlorthalidone was resumed. Although he did not report a history of gout, chart review shows previous treatment with allopurinol  that was stopped in 2022. At this point, I favor inflammatory causes such as gout/pseudogout over septic, rheumatoid, or reactive given relative isolation of his presenting symptoms and absence of fever, high white count. Unfortunately joint aspiration in ER could not collect enough fluid to check for crystals but a stain/culture are underway and early result is without organisms. At this point, will treat with steroids for crystal arthropathy and hold antibiotics, although these can be started pending cultures/fevers. The R elbow is swollen and may be potential target for arthrocentesis. -Case discussed with ortho yesterday who should see pt this morning, thank you -Start prednisone 40 mg daily day 1/7 -Follow-up MRI read -Follow-up aspirate culture -Tylenol, dilaudid prn for pain -Monitor for fevers, would then start antibiotics -Consider PT when improving  Chronic Stable Issues Insulin-dependent T2DM Diabetic neuropathy SSI Tresiba 36 subcutaneous Gabapentin 100 TID  History DVT/PE Chronically anticoagulated. Will hold Eliquis for now, can resume once sure no more aspirations/procedure will be performed.  CKD stage 4 Monitor. Baseline Cr around 3.6. Avoid nephrotoxic medications re pain and gout treatment.  Chronic Diastolic heart failure Lasix 80 BID at home. Will do reduced dose at this point to 40 BID per concern of gout. Monitor volume. Euvolemic at admission.  Overactive Bladder Oxybutynin 5 TID  HTN Amlodipine 10 at bedtime  Coreg 25 BID Hydralazine 100 TID  Chronic Pain Voltaren gel Tylenol  Constipation Miralax PRN  HLD Pravastatin 10 every day  BPH Flomax at bedtime   Diet: Carb/Renal VTE: None resume DOAC shortly IVF: None,None Code: Full  Prior to Admission Living Arrangement: Home, living independently Anticipated  Discharge Location: Home Barriers to Discharge: Medical workup  Dispo: Admit patient to Observation with expected length of stay less than 2 midnights.  Signed: Katheran James, DO Internal Medicine Resident PGY-1 Pager: [phone] 02/23/2023, 6:59 AM   On weekends or after 5pm please page on call intern or resident: First contact: [phone] If no answer in 15 minutes, please contact senior pager at [phone]

## 2023-02-23 NOTE — Progress Notes (Signed)
Transition of Care [facility]) - Inpatient Brief Assessment   Patient Details  Name: [name] MRN: [id number] Date of Birth: 03/13/1975  [facility]) CM/SW Contact:    Oletta Cohn, RN Phone Number: 02/23/2023, 2:54 PM   Clinical Narrative: RNCM met with pt at bedside regarding discharge plan.  Pt utilizes wheelchair and walker for mobility as he is Right AKA.  He also has shower bench.  TOC will follow for any additional needs.   Transition of Care Asessment: Insurance and Status: Insurance coverage has been reviewed Patient has primary care physician: Yes Home environment has been reviewed: apartment, alone Prior level of function:: (P) wheelchair (right AKA), walker and shower bench Prior/Current Home Services: (P) Current home services ([facility]) Social Drivers of Health Review: (P) SDOH reviewed no interventions necessary Readmission risk has been reviewed: (P) Yes Transition of care needs: (P) transition of care needs identified, TOC will continue to follow

## 2023-02-23 NOTE — Hospital Course (Addendum)
Inflammatory arthropathy 2/2 gout Tenosynovitis  Treated with high-dose prednisone. Synovial aspiration with no growth at time of discharge and clinically low suspicion of infectious etiology. Imaging showed radiocarpal joint effusion with synovitis, small midcarpal joint effusion, diffuse intramuscular and subcutaneous edema; medial palmar subluxation of the extensor carpi ulnaris tendon of the sixth extensor compartment with injury of the ECU subsheath and tenosynovitis; tenosynovitis of the second and third extensor compartment tendons, tenosynovitis of the fourth extensor compartment tendons; and tendinosis of the abductor pollicis longus tendon of the first extensor compartment. This combined with uric acid level of 14.8 was concerning for acute gout flare, likely in setting of recent chlorthalidone initiation. Pain and mobility of affected joints (R wrist, elbow, shoulder) improved with prednisone dosing and he was able to resume transfers with his wheelchair and personal care needs. He remained afebrile without leukocytosis at time of discharge.   Insulin-dependent T2DM Diabetic neuropathy Difficulty managing blood glucose levels in the setting of high dose prednisone therapy for acute gout flare. Insulin doses increased as indicated each day. Final insulin regimen recommendations discussed with patient's endocrinologist, who will follow closely following discharge for further adjustments as indicated. He has a [employer] that he will place on arrival home.   History DVT/PE Home eliquis continued.   CKD stage 4 Renal function stable throughout admission.   Chronic Diastolic heart failure Home furosemide held on admission and restarted at gradually increasing doses. He is clinically euvolemic at time of discharge.   Overactive Bladder Continued home oxybutynin.   HTN Continued home amlodipine, carvedilol, and hydralazine. Chlorthalidone was discontinued in the setting of acute gout  flare and not restarted.    Chronic Pain Managed with voltaren gel, tylenol.    Constipation Managed with as-needed miralax.   HLD Continued home pravastatin.   BPH Continued home flomax.

## 2023-02-23 NOTE — Care Management Obs Status (Signed)
MEDICARE OBSERVATION STATUS NOTIFICATION   Patient Details  Name: [name] MRN: [id number] Date of Birth: [birth year]   Medicare Observation Status Notification Given:  Yes    Oletta Cohn, RN 02/23/2023, 2:15 PM

## 2023-02-23 NOTE — Consult Note (Signed)
ORTHOPAEDIC CONSULTATION  REQUESTING PHYSICIAN: Dickie La, MD    Chief Complaint: left upper extremity pain and swelling.    HPI: [name] is a 48 y.o. male who presents with continued left upper extremity pain and swelling.  Patient was initially seen in our clinic on 01/30/2023 for de Quervain's tenosynovitis which was injected with cortisone.  He started to experience increased pain and swelling of the hand all the way to the elbow on 02/03/2023.  No fevers or chills at that time.  He was seen in our office again on 02/06/2023 where we thought he had a probable postinjection flare.  He was started on a steroid pack which significantly helped until 02/19/2023.  He tells me that his pain and swelling returned at that time.  No fevers or chills at that time.  He had been wearing a splint and had only been using his hand to use the mouse for his computer for his schoolwork.  We sent in a steroid pack on 02/21/2023 as this significantly helped previously.  He tells me he did not start this until yesterday morning.  He continued to have worsening pain in his wrists radiating to the elbow and up into the right shoulder and was referred to the ED by his PCP.  Currently denies any fevers or chills.  Right upper extremity ultrasound performed yesterday which was negative for DVT.  Lab work was significant for sed rate at 117, CRP 20.4, uric acid 14.8, white blood cell count 12.1.  Right wrist aspiration shows no growth to date.  Right wrist MRI shows diffuse soft tissue swelling with a small joint effusion.  Past Medical History:  Diagnosis Date   Acute venous embolism and thrombosis of deep vessels of proximal lower extremity (HCC) 07/19/2011   Anemia    Anginal pain (HCC)    pt denies   Anxiety    Chest pain, neg MI, normal coronaries by cath 02/18/2013   pt denies   CHF (congestive heart failure) (HCC)    Chronic renal disease, stage IV (HCC) 02/19/2013   Colon polyps    Depression     Diabetic ulcer of right foot (HCC)    DVT (deep venous thrombosis) (HCC) 09/2002   patient reports additional DVTs in '06 & '11 (unconfirmed)   ED (erectile dysfunction)    GERD (gastroesophageal reflux disease)    History of blood transfusion    "related to OR" (10/31/2016)   Hyperlipidemia 02/19/2013   Hypertension    Nausea & vomiting    "constant for the last couple weeks" (10/31/2016)   Nephrotic syndrome    Obesity    BMI 44, weight 346 pounds 01/30/14   OSA (obstructive sleep apnea) 02/28/2018   Mild obstructive sleep apnea with an AHI of 9.8/h but severe during REM sleep with an AHI of 33.8/h.  Oxygen saturations dropped to 86% and there was moderate snoring   Peripheral edema    Pneumonia    Prosthesis adjustment 08/17/2016   Pulmonary embolism (HCC) 09/2002   treated with 6 months of warfarin   Pyelonephritis 02/02/2018   Type I diabetes mellitus (HCC) dx'd 2001   Urinary retention    Past Surgical History:  Procedure Laterality Date   AMPUTATION Right 12/22/2013   Procedure: AMPUTATION BELOW KNEE;  Surgeon: Toni Arthurs, MD;  Location: [facility] OR;  Service: Orthopedics;  Laterality: Right;   AMPUTATION Right 02/19/2014   Procedure: RIGHT ABOVE KNEE AMPUTATION ;  Surgeon: Toni Arthurs, MD;  Location: [facility]  OR;  Service: Orthopedics;  Laterality: Right;   blood clot removal  2016   CARDIAC CATHETERIZATION  02/18/2013   normal coronaries   ESOPHAGOGASTRODUODENOSCOPY N/A 11/02/2016   Procedure: ESOPHAGOGASTRODUODENOSCOPY (EGD);  Surgeon: Iva Boop, MD;  Location: [facility] ENDOSCOPY;  Service: Endoscopy;  Laterality: N/A;   I & D EXTREMITY Right 12/14/2013   Procedure: IRRIGATION AND DEBRIDEMENT RIGHT FOOT;  Surgeon: Verlee Rossetti, MD;  Location: [facility] OR;  Service: Orthopedics;  Laterality: Right;   INCISION AND DRAINAGE ABSCESS  2007; 2015   "back"   INCISION AND DRAINAGE OF WOUND Right 12/22/2013   Procedure: I&D RIGHT BUTTOCK;  Surgeon: Toni Arthurs, MD;  Location: [facility] OR;  Service:  Orthopedics;  Laterality: Right;   LEFT HEART CATHETERIZATION WITH CORONARY ANGIOGRAM N/A 02/18/2013   Procedure: LEFT HEART CATHETERIZATION WITH CORONARY ANGIOGRAM;  Surgeon: Peter M Swaziland, MD;  Location: [facility] CATH LAB;  Service: Cardiovascular;  Laterality: N/A;   Social History   Socioeconomic History   Marital status: Single    Spouse name: Not on file   Number of children: 0   Years of education: 15.5   Highest education level: Some college, no degree  Occupational History   Occupation: student    Comment: [employer]   Occupation: disability  Tobacco Use   Smoking status: Never   Smokeless tobacco: Never  Vaping Use   Vaping status: Never Used  Substance and Sexual Activity   Alcohol use: Yes    Comment: 10/31/2016 "a few beers q 6 months or so"   Drug use: No   Sexual activity: Yes  Other Topics Concern   Not on file  Social History Narrative   Financial assistance approved for 100% discount at [facility] and has [facility] card per [employer]   09/01/2009.      Lives in [place] with mother and sister.            Social Drivers of Health   Financial Resource Strain: Medium Risk (01/31/2023)   Overall Financial Resource Strain (CARDIA)    Difficulty of Paying Living Expenses: Somewhat hard  Food Insecurity: No Food Insecurity (01/31/2023)   Hunger Vital Sign    Worried About Running Out of Food in the Last Year: Never true    Ran Out of Food in the Last Year: Never true  Transportation Needs: No Transportation Needs (01/31/2023)   PRAPARE - Administrator, Civil Service (Medical): No    Lack of Transportation (Non-Medical): No  Physical Activity: Sufficiently Active (01/31/2023)   Exercise Vital Sign    Days of Exercise per Week: 6 days    Minutes of Exercise per Session: 120 min  Stress: No Stress Concern Present (01/31/2023)   [employer] of Occupational Health - Occupational Stress Questionnaire    Feeling of Stress : Not at all  Social Connections:  Socially Isolated (01/31/2023)   Social Connection and Isolation Panel [NHANES]    Frequency of Communication with Friends and Family: More than three times a week    Frequency of Social Gatherings with Friends and Family: More than three times a week    Attends Religious Services: Never    Database administrator or Organizations: No    Attends Banker Meetings: Never    Marital Status: Never married   Family History  Problem Relation Age of Onset   Heart disease Mother    Diabetes Mother    Diabetes Maternal Uncle    Diabetes Cousin    -  negative except otherwise stated in the family history section Allergies  Allergen Reactions   Reglan [Metoclopramide] Other (See Comments)    Dysphoric reaction   Metformin And Related Diarrhea   Ozempic (0.25 Or 0.5 Mg-Dose) [Semaglutide(0.25 Or 0.5mg -Dos)] Nausea And Vomiting   Prior to Admission medications   Medication Sig Start Date End Date Taking? Authorizing Provider  amLODipine (NORVASC) 10 MG tablet Take 10 mg by mouth at bedtime.   Yes [provider]  carvedilol (COREG) 25 MG tablet TAKE 1 TABLET BY MOUTH TWICE DAILY WITH A MEAL 12/04/22  Yes Kathleen Lime, MD  chlorthalidone (HYGROTON) 25 MG tablet Take 25 mg by mouth daily. 04/18/21  Yes [provider]  cloNIDine (CATAPRES - DOSED IN MG/24 HR) 0.1 mg/24hr patch 0.1 mg once a week. 05/04/21  Yes [provider]  diclofenac Sodium (VOLTAREN) 1 % GEL Apply 2 g topically 4 (four) times daily. 01/31/21  Yes Dolan Amen, MD  ELIQUIS 5 MG TABS tablet Take 1 tablet by mouth twice daily 10/31/22  Yes Kathleen Lime, MD  fluticasone [facility]) 50 MCG/ACT nasal spray Place 2 sprays into both nostrils daily. Patient taking differently: Place 2 sprays into both nostrils daily as needed for allergies. 03/29/20 02/23/24 Yes Dolan Amen, MD  furosemide (LASIX) 80 MG tablet Take 1 tablet by mouth twice daily Patient taking differently: Take 80 mg by mouth 2  (two) times daily. 01/15/20  Yes Dellia Cloud, MD  gabapentin (NEURONTIN) 100 MG capsule TAKE 1 CAPSULE BY MOUTH THREE TIMES DAILY Patient taking differently: Take 100 mg by mouth 2 (two) times daily. 12/04/22  Yes Kathleen Lime, MD  Glucagon (GVOKE HYPOPEN 1-PACK) 1 MG/0.2ML SOAJ Inject 1 mg into the skin as needed (low blood sugar with impaired consciousness). 09/05/22  Yes Thapa, [place], MD  hydrALAZINE (APRESOLINE) 100 MG tablet Take 100 mg by mouth 3 (three) times daily. 07/13/20  Yes [provider]  insulin degludec (TRESIBA FLEXTOUCH) 200 UNIT/ML FlexTouch Pen INJECT 34 UNITS SUBCUTANEOUSLY ONCE DAILY Patient taking differently: INJECT 36 UNITS SUBCUTANEOUSLY ONCE DAILY 12/06/22  Yes Thapa, [place], MD  lidocaine (HM LIDOCAINE PATCH) 4 % Place 1 patch onto the skin daily. 12/22/21  Yes Adron Bene, MD  lovastatin (MEVACOR) 20 MG tablet Take 1 tablet by mouth once daily 12/04/22  Yes Kathleen Lime, MD  NOVOLOG FLEXPEN 100 UNIT/ML FlexPen Inject 0-4 Units into the skin 3 (three) times daily with meals. Administer 0 units in the morning with breakfast, 0-4 units with lunch, and 0-4 units with dinner daily, maximum 30 units/day 12/06/22 03/06/23 Yes Thapa, [place], MD  oxybutynin (DITROPAN) 5 MG tablet TAKE 1 TABLET BY MOUTH THREE TIMES DAILY 01/30/23  Yes Kathleen Lime, MD  polyethylene glycol (MIRALAX / GLYCOLAX) 17 g packet Take 17 g by mouth 2 (two) times daily. Patient taking differently: Take 17 g by mouth as needed for mild constipation. 09/02/19  Yes Bloomfield, Carley D, DO  potassium chloride SA (KLOR-CON) 20 MEQ tablet Take 20 mEq by mouth 3 (three) times daily. 11/24/19  Yes [provider]  predniSONE (STERAPRED UNI-PAK 21 TAB) 5 MG (21) TBPK tablet Take as directed.  Do not take with any other nsaids.  Keep close eye on blood sugars 02/21/23  Yes Cristie Hem, PA-C  senna-docusate (SENOKOT-S) 8.6-50 MG tablet Take 1 tablet by mouth daily. Patient taking differently:  Take 1 tablet by mouth as needed for mild constipation. 09/26/19  Yes Dolan Amen, MD  tamsulosin (FLOMAX) 0.4 MG CAPS capsule Take  1 capsule by mouth at bedtime 12/04/22  Yes Kathleen Lime, MD  tirzepatide [facility]) 15 MG/0.5ML Pen Inject 15 mg into the skin once a week. 09/05/22  Yes Thapa, [place], MD  traMADol (ULTRAM) 50 MG tablet Take 1 tablet (50 mg total) by mouth every 12 (twelve) hours as needed. Patient taking differently: Take 50 mg by mouth every 12 (twelve) hours as needed for moderate pain (pain score 4-6). 02/21/23  Yes Cristie Hem, PA-C  B-D UF III MINI PEN NEEDLES 31G X 5 MM MISC USE 1 NEEDLE FIVE TIMES DAILY 12/25/22   Kathleen Lime, MD  Blood Glucose Monitoring Suppl (ACCU-CHEK GUIDE ME) w/Device KIT Use to check blood sugar up to 3 times a day 05/11/22   Reather Littler, MD  glucose blood (ACCU-CHEK GUIDE) test strip Check blood sugar up to 3 times a day 05/11/22   Reather Littler, MD  Insulin Pen Needle (B-D UF III MINI PEN NEEDLES) 31G X 5 MM MISC 1 each by Other route See admin instructions. USE 4-5 TIMES DAILY 03/08/20   Roylene Reason, MD  Lancet Devices [facility]) lancets Use to check blood sugars 5 times a day. Dx code:E11.9. Insulin dependent. Patient taking differently: 1 each by Other route See admin instructions. Use to check blood sugars 5 times a day. Dx code:E11.9. Insulin dependent. 08/14/16   Camelia Phenes, DO   DG Wrist Complete Right Result Date: 02/22/2023 CLINICAL DATA:  Pain and edema. EXAM: RIGHT WRIST - COMPLETE 3 VIEW COMPARISON:  02/06/2023. FINDINGS: There is no evidence of fracture or dislocation. There is no evidence of arthropathy or other focal bone abnormality. Posterior wrist soft tissue swelling. IMPRESSION: Soft tissue swelling. No acute osseous abnormalities. Electronically Signed   By: Layla Maw M.D.   On: 02/22/2023 19:18   UE VENOUS DUPLEX (7am - 7pm) Result Date: 02/22/2023 UPPER VENOUS STUDY  Patient Name:  [name]  Date of Exam:   02/22/2023 Medical Rec #: [id number]      Accession #:    [id number] Date of Birth: 06/06/1975      Patient Gender: M Patient Age:   30 years Exam Location:  [facility] Procedure:      VAS [place] UPPER EXTREMITY VENOUS DUPLEX Referring Phys: Arabella Merles --------------------------------------------------------------------------------  Risk Factors: None identified. Limitations: Poor ultrasound/tissue interface and patient positioning, patient pain tolerance. Comparison Study: No prior studies. Performing Technologist: Chanda Busing RVT  Examination Guidelines: A complete evaluation includes B-mode imaging, spectral Doppler, color Doppler, and power Doppler as needed of all accessible portions of each vessel. Bilateral testing is considered an integral part of a complete examination. Limited examinations for reoccurring indications may be performed as noted.  Right Findings: +----------+------------+---------+-----------+----------+-------+ RIGHT     CompressiblePhasicitySpontaneousPropertiesSummary +----------+------------+---------+-----------+----------+-------+ IJV           Full       Yes       Yes                      +----------+------------+---------+-----------+----------+-------+ Subclavian    Full       Yes       Yes                      +----------+------------+---------+-----------+----------+-------+ Axillary      Full       Yes       Yes                      +----------+------------+---------+-----------+----------+-------+  Brachial      Full                                          +----------+------------+---------+-----------+----------+-------+ Radial        Full                                          +----------+------------+---------+-----------+----------+-------+ Ulnar         Full                                          +----------+------------+---------+-----------+----------+-------+ Cephalic      Full                                           +----------+------------+---------+-----------+----------+-------+ Basilic       Full                                          +----------+------------+---------+-----------+----------+-------+  Left Findings: +----------+------------+---------+-----------+----------+-------+ LEFT      CompressiblePhasicitySpontaneousPropertiesSummary +----------+------------+---------+-----------+----------+-------+ Subclavian    Full       Yes       Yes                      +----------+------------+---------+-----------+----------+-------+  Summary:  Right: No evidence of deep vein thrombosis in the upper extremity. No evidence of superficial vein thrombosis in the upper extremity.  Left: No evidence of thrombosis in the subclavian.  *See table(s) above for measurements and observations.    Preliminary    - pertinent xrays, CT, MRI studies were reviewed and independently interpreted  Positive ROS: All other systems have been reviewed and were otherwise negative with the exception of those mentioned in the HPI and as above.  Physical Exam: General: No acute distress Cardiovascular: No pedal edema Respiratory: No cyanosis, no use of accessory musculature GI: No organomegaly, abdomen is soft and non-tender Skin: No lesions in the area of chief complaint Neurologic: Sensation intact distally Psychiatric: Patient is at baseline mood and affect Lymphatic: No axillary or cervical lymphadenopathy  MUSCULOSKELETAL:  Right upper extremity: Moderate diffuse swelling to the right hand and right elbow.  Mild warmth.  Mild erythema.  No skin changes.  He is able to slightly move his fingers, wrist and elbow but this is all limited secondary to pain.  I cannot get him to move his shoulder due to lack of effort.  He is neurovascularly intact distally.  Assessment: Impression is chronic right hand/wrist pain and swelling with radiation to the right elbow and  shoulder.  Suspicion for infection remains low based on recent lab work and lack of culture growth to date.    Plan: At this point, we recommend admitting the patient for IV steroids for likely gouty attack and to follow cultures to ensure there is no underlying infection.  Please hold off on antibiotics at this point.  Thank you for the consult and the opportunity to see [name].  [name], MD [facility] 8:17 AM

## 2023-02-23 NOTE — Progress Notes (Signed)
HD#0 SUBJECTIVE:  Patient Summary: [name] is a 48 y.o. with a pertinent PMH of T2DM on insulin, tenosynovitis with recent steroid injections, HFpEF, CKD stage 4, recurrent VTE on Eliquis, and R above knee amputation, who presented with R wrist pain and swelling and admitted for acute gout flare.   Overnight Events: NAEON  Interim History: Patient is understandably frustrated to be in a gout flare. He explains how hard he works in making lifestyle adjustments to avoid inflammation including modification of his diet, exercise. He is worried that he won't be able to leave until he has full use of the RUE again as he has to be able to safely transfer to/from his wheelchair.  OBJECTIVE:  Vital Signs: Vitals:   02/23/23 1135 02/23/23 1407 02/23/23 1443 02/23/23 1610  BP: (!) 143/68 137/73  (!) 153/76  Pulse:  85  83  Resp:  19  19  Temp:   98.4 F (36.9 C) 98.1 F (36.7 C)  TempSrc:   Oral Oral  SpO2:  100%  97%  Weight:      Height:       Supplemental O2: Room Air SpO2: 97 %  Filed Weights   02/22/23 1733  Weight: (!) 140.2 kg    No intake or output data in the 24 hours ending 02/23/23 1800 Net IO Since Admission: No IO data has been entered for this period [02/23/23 1800]  Physical Exam: Constitutional:Pleasant gentleman, resting comfortably in bed. In no acute distress. Cardio:Regular rate and rhythm. No murmurs, rubs, or gallops. Pulm:Clear to auscultation bilaterally. Normal work of breathing on room air. MSK/Skin:Able to move through active range of motion though limited of R shoulder and wrist. Limited in wrist dorsiflexion and fist formation due to pain. Swelling of dorsal R wrist persists. Not erythematous or warm. Neuro:Alert and oriented x3. No focal deficit noted. Psych:Pleasant mood and affect.  Patient Lines/Drains/Airways Status     Active Line/Drains/Airways     Name Placement date Placement time Site Days   Peripheral IV 10/01/20 18 G Right  Antecubital 10/01/20  0429  Antecubital  875             ASSESSMENT/PLAN:  Assessment: Principal Problem:   Inflammatory arthropathy Active Problems:   Insulin dependent type 2 diabetes mellitus, controlled (HCC)   History of DVT (deep vein thrombosis)   CKD (chronic kidney disease) stage 4, GFR 15-29 ml/min (HCC)   Chronic diastolic heart failure (HCC)   Dyslipidemia   Urinary retention   Diabetic neuropathy (HCC)  [name] is a 48 y.o. with a pertinent PMH of T2DM on insulin, tenosynovitis with recent steroid injections, HFpEF, CKD stage 4, recurrent VTE on Eliquis, and R above knee amputation, who presented with R wrist pain and swelling and admitted for acute gout flare.   Plan: Inflammatory arthropathy, suspect 2/2 gout Tenosynovitis  On day 2 of prednisone 40 mg, subjectively still having pain but some improvement in movement of the shoulder and wrist joints. Leukocytosis resolved. Suspicion for infection remains low. Has received dilaudid 3 times in last 24h but states current dose is ineffective. Plan: -Orthopedic surgery consulted, appreciate their recommendations -Will increase prednisone dose to 60 mg daily as patient is tolerating PO intake  -Additional 20 mg dose given this afternoon for total of 60 mg dose for today  -Plan for total 10 day course prior to slow taper -Increase to dilaudid 2 mg q4h for pain control   Insulin-dependent T2DM Diabetic neuropathy CBG remain elevated in  200-300 range. I suspect glucose control will be a challenge while he is on a prolonged steroid course and taper. Plan: -Continue CBG TID with meals, SSI TID with meals and at bedtime, novolog 5 units TID with meals -Increase to semglee 40 units daily at bedtime -Continue home gabapentin 100 mg TID   History DVT/PE Chronically anticoagulated in eliquis. Plan: -Continue home eliquis 5 mg BID   CKD stage 4 Hyponatremia Non-anion gap metabolic acidosis Renal function today  improved compared to even his baseline. Plan: -Trend renal function -Avoid nephrotoxic agents   Chronic diastolic heart failure Euvolemic. Lasix currently on hold in setting of acute gout flare. Plan: -Continue to hold home lasix given euvolemia   Overactive bladder Plan: -Continue oxybutynin 5 TID   HTN BP stable, normotensive this morning. Plan: -Continue home amlodipine 10 mg daily, carvedilol 25 mg BID, hydralazine 100 mg TID -Continue to hold chlorthalidone in setting of gout flare; would not recommend restarting this   Chronic Pain Plan: -Continue voltaren gel 4 times daily, tylenol 650 mg q6h   Constipation Plan: -Continue miralax daily constipation   HLD Plan: -Continue home pravastatin 10 mg daily   BPH Plan: -Continue home flomax 0.4 mg daily at bedtime  Best Practice: Diet: Diabetic diet IVF: None VTE: SCDs Start: 02/23/23 7829 Code: Full AB: None DISPO: Pending.  Signature: Champ Mungo, D.O.  Internal Medicine Resident, PGY-2 [facility]  Pager: [phone]  Please contact the on call pager after 5 pm and on weekends at [phone].

## 2023-02-23 NOTE — Progress Notes (Addendum)
Subjective:  [name] is a 47 y.o. man with a history of T2DM on insulin, tenosynovitis with recent steroid injections, HFpEF, CKD stage 4, recurrent VTE on Eliquis, and R above knee amputation who presented with R wrist pain and admitted for inflammatory arthropathy due to suspected gout.  Today, he reports R wrist pain, R elbow pain, and R shoulder pain. His wrist pain is worse than the elbow and shoulder pain. The pain originally started after the first wrist injection. He denies any history of gout and was unsure why he was on allopurinol in the past.  He denies fevers, chills.  He stated that the prednisone did improve the swelling in the outpatient setting.  Objective:  Vital signs in last 24 hours: Vitals:   02/23/23 0730 02/23/23 0731 02/23/23 0745 02/23/23 1043  BP: (!) 142/70 (!) 142/70 133/77 118/78  Pulse: 85 83 84 86  Resp:  (!) 83  20  Temp:  98.3 F (36.8 C)  98.7 F (37.1 C)  TempSrc:  Oral  Oral  SpO2: 100% 100% 100% 100%  Weight:      Height:       Physical Exam: General:NAD, laying in bed  Cardiac:RRR, no murmur appreciated  Pulmonary:normal effort on room air  Abdominal:soft, non-tender Neuro:awake, alert  MSK:R wrist swelling, warm to touch, No shoulder or elbow tenderness when palpating  Skin:Warm and dry  Psych:  Normal mood and affect      Latest Ref Rng & Units 02/22/2023    5:33 PM 02/07/2023   12:55 PM 01/17/2023    9:07 AM  CBC  WBC 4.0 - 10.5 K/uL 12.1     Hemoglobin 13.0 - 17.0 g/dL 29.5  28.4  13.2   Hematocrit 39.0 - 52.0 % 34.1     Platelets 150 - 400 K/uL 277          Latest Ref Rng & Units 02/22/2023    5:33 PM 02/07/2023   12:51 PM 01/17/2023    9:04 AM  BMP  Glucose 70 - 99 mg/dL 89  81  440   BUN 6 - 20 mg/dL 80  80  92   Creatinine 0.61 - 1.24 mg/dL 1.02  7.25  3.66   Sodium 135 - 145 mmol/L 137  138  135   Potassium 3.5 - 5.1 mmol/L 4.5  4.0  3.7   Chloride 98 - 111 mmol/L 101  104  98   CO2 22 - 32 mmol/L 21  24  24     Calcium 8.9 - 10.3 mg/dL 9.7  9.3  9.1      MRI:  -Radiocarpal joint effusion with synovitis. Small midcarpal joint effusion. Diffuse intramuscular and subcutaneous edema, most pronounced dorsally.  -Tenosynovitis of the second and third extensor compartment tendons, which can be seen in the setting of distal intersection syndrome   Assessment/Plan:  Principal Problem:   Inflammatory arthropathy Active Problems:   Insulin dependent type 2 diabetes mellitus, controlled (HCC)   History of DVT (deep vein thrombosis)   CKD (chronic kidney disease) stage 4, GFR 15-29 ml/min (HCC)   Chronic diastolic heart failure (HCC)   Dyslipidemia   Urinary retention   Diabetic neuropathy (HCC)  Inflammatory Arthropathy Suspect Gout Tenosynovitis  Patient presented with R wrist edema and warmth with MRI findings of Tenosynovitis of the second and third extensor compartment tendons. Physical exam is remarkable for R wrist edema and warmth. Lower suspicion for infective tenosynovitis, WBC is mildly elevated at 12.1, prelim aspirate  does not show evidence of infection, and the edema originally improved with oral prednisone. I suspect that he has underlying gout causing a Gouty tenosynovitis. Orthopedic surgery was consulted.  Plan: -Orthopedics consulted, appreciate recommendations  -Prednisone 40mg  daily  -Tylenol, dilaudid prn for pain  -Will monitor fever curve, low suspicion for infectious process at the moment    Chronic Stable Issues Insulin-dependent T2DM Diabetic neuropathy -Resistant SSI with use of steroids  -Semglee 36 units, night time correction placed  -Gabapentin 100 TID -CBGs with meals    History DVT/PE Chronically anticoagulated.  -Will hold Eliquis for now, can resume once sure no more aspirations/procedure will be performed. -Patient is on heparin for DVT prophylaxis at the moment, will continue until we know that orthopedics will not be performing any procedures     CKD  stage 4 Monitor. Baseline Cr around 3.6. Avoid nephrotoxic medications re pain and gout treatment.   Chronic Diastolic heart failure -Lasix 80 BID at home. Will discontinue for concern of gout. Monitor volume. Euvolemic at admission.   Overactive Bladder -Continue oxybutynin 5 TID   HTN Will continue regimen of Amlodipine 10 at bedtime , Coreg 25 BID, Hydralazine 100 TID -recommend d/c chlorthalidone with suspected gout    Chronic Pain Voltaren gel Tylenol   Constipation Continue Miralax PRN   HLD Continue Pravastatin 10 every day   BPH Continue Flomax at bedtime   Resolved Problems:  __________________________________  Code Status: Full  VTE Prophylaxis:Heparin 5,000 TID  Diet:Card modified  IVF:N/A Barriers to Discharge: Medical treatment Dispo: Anticipated discharge in approximately 1-2 day(s).   Faith Rogue, DO 02/23/2023, 10:58 AM Pager: [phone] After 5pm on weekdays and 1pm on weekends: On Call pager [phone]

## 2023-02-23 NOTE — ED Provider Notes (Signed)
[name] EMERGENCY DEPARTMENT AT [facility] Provider Note   CSN: [id number] Arrival date & time: 02/22/23  1648     History  Chief Complaint  Patient presents with   Arm Swelling    [name] is a 48 y.o. male.  Patient presents to the emergency room complaining of right-sided hand/wrist/forearm swelling.  Patient with history of de Quervain's tenosynovitis, was treated with a cortisone shot at orthopedics on January 25.  Developed worsening pain and was seen again by that same practice who prescribed a course of steroids believing the patient may have been having a postinjection flare.  The patient initially had an improvement in symptoms.  Swelling and pain began to increase again after the steroid was complete and he was seen by orthopedics on February 12.  At this time he was prescribed tramadol and had another steroid course ordered.  He states he took 2 doses of the steroid on Thursday but due to pain and swelling saw his internal medicine provider who recommended to come to the emergency department for further evaluation.  They initially attempted direct admission but due to bed availability sent him to the ED.    HPI     Home Medications Prior to Admission medications   Medication Sig Start Date End Date Taking? Authorizing Provider  amLODipine (NORVASC) 10 MG tablet Take 10 mg by mouth at bedtime.   Yes [provider]  carvedilol (COREG) 25 MG tablet TAKE 1 TABLET BY MOUTH TWICE DAILY WITH A MEAL 12/04/22  Yes Kathleen Lime, MD  chlorthalidone (HYGROTON) 25 MG tablet Take 25 mg by mouth daily. 04/18/21  Yes [provider]  cloNIDine (CATAPRES - DOSED IN MG/24 HR) 0.1 mg/24hr patch 0.1 mg once a week. 05/04/21  Yes [provider]  diclofenac Sodium (VOLTAREN) 1 % GEL Apply 2 g topically 4 (four) times daily. 01/31/21  Yes Dolan Amen, MD  ELIQUIS 5 MG TABS tablet Take 1 tablet by mouth twice daily 10/31/22  Yes Kathleen Lime, MD   fluticasone [facility]) 50 MCG/ACT nasal spray Place 2 sprays into both nostrils daily. Patient taking differently: Place 2 sprays into both nostrils daily as needed for allergies. 03/29/20 02/23/24 Yes Dolan Amen, MD  furosemide (LASIX) 80 MG tablet Take 1 tablet by mouth twice daily Patient taking differently: Take 80 mg by mouth 2 (two) times daily. 01/15/20  Yes Dellia Cloud, MD  gabapentin (NEURONTIN) 100 MG capsule TAKE 1 CAPSULE BY MOUTH THREE TIMES DAILY Patient taking differently: Take 100 mg by mouth 2 (two) times daily. 12/04/22  Yes Kathleen Lime, MD  Glucagon (GVOKE HYPOPEN 1-PACK) 1 MG/0.2ML SOAJ Inject 1 mg into the skin as needed (low blood sugar with impaired consciousness). 09/05/22  Yes Thapa, [place], MD  hydrALAZINE (APRESOLINE) 100 MG tablet Take 100 mg by mouth 3 (three) times daily. 07/13/20  Yes [provider]  insulin degludec (TRESIBA FLEXTOUCH) 200 UNIT/ML FlexTouch Pen INJECT 34 UNITS SUBCUTANEOUSLY ONCE DAILY Patient taking differently: INJECT 36 UNITS SUBCUTANEOUSLY ONCE DAILY 12/06/22  Yes Thapa, [place], MD  lidocaine (HM LIDOCAINE PATCH) 4 % Place 1 patch onto the skin daily. 12/22/21  Yes Adron Bene, MD  lovastatin (MEVACOR) 20 MG tablet Take 1 tablet by mouth once daily 12/04/22  Yes Kathleen Lime, MD  NOVOLOG FLEXPEN 100 UNIT/ML FlexPen Inject 0-4 Units into the skin 3 (three) times daily with meals. Administer 0 units in the morning with breakfast, 0-4 units with lunch, and 0-4 units with dinner daily, maximum  30 units/day 12/06/22 03/06/23 Yes Thapa, [place], MD  oxybutynin (DITROPAN) 5 MG tablet TAKE 1 TABLET BY MOUTH THREE TIMES DAILY 01/30/23  Yes Kathleen Lime, MD  polyethylene glycol (MIRALAX / GLYCOLAX) 17 g packet Take 17 g by mouth 2 (two) times daily. Patient taking differently: Take 17 g by mouth as needed for mild constipation. 09/02/19  Yes Bloomfield, Carley D, DO  potassium chloride SA (KLOR-CON) 20 MEQ tablet Take 20 mEq by mouth 3 (three)  times daily. 11/24/19  Yes [provider]  predniSONE (STERAPRED UNI-PAK 21 TAB) 5 MG (21) TBPK tablet Take as directed.  Do not take with any other nsaids.  Keep close eye on blood sugars 02/21/23  Yes Cristie Hem, PA-C  senna-docusate (SENOKOT-S) 8.6-50 MG tablet Take 1 tablet by mouth daily. Patient taking differently: Take 1 tablet by mouth as needed for mild constipation. 09/26/19  Yes Dolan Amen, MD  tamsulosin [facility]) 0.4 MG CAPS capsule Take 1 capsule by mouth at bedtime 12/04/22  Yes Kathleen Lime, MD  tirzepatide [facility]) 15 MG/0.5ML Pen Inject 15 mg into the skin once a week. 09/05/22  Yes Thapa, [place], MD  traMADol (ULTRAM) 50 MG tablet Take 1 tablet (50 mg total) by mouth every 12 (twelve) hours as needed. Patient taking differently: Take 50 mg by mouth every 12 (twelve) hours as needed for moderate pain (pain score 4-6). 02/21/23  Yes Cristie Hem, PA-C  B-D UF III MINI PEN NEEDLES 31G X 5 MM MISC USE 1 NEEDLE FIVE TIMES DAILY 12/25/22   Kathleen Lime, MD  Blood Glucose Monitoring Suppl (ACCU-CHEK GUIDE ME) w/Device KIT Use to check blood sugar up to 3 times a day 05/11/22   Reather Littler, MD  glucose blood (ACCU-CHEK GUIDE) test strip Check blood sugar up to 3 times a day 05/11/22   Reather Littler, MD  Insulin Pen Needle (B-D UF III MINI PEN NEEDLES) 31G X 5 MM MISC 1 each by Other route See admin instructions. USE 4-5 TIMES DAILY 03/08/20   Roylene Reason, MD  Lancet Devices [facility]) lancets Use to check blood sugars 5 times a day. Dx code:E11.9. Insulin dependent. Patient taking differently: 1 each by Other route See admin instructions. Use to check blood sugars 5 times a day. Dx code:E11.9. Insulin dependent. 08/14/16   Camelia Phenes, DO      Allergies    Reglan [metoclopramide], Metformin and related, and Ozempic (0.25 or 0.5 mg-dose) [semaglutide(0.25 or 0.5mg -dos)]    Review of Systems   Review of Systems  Physical Exam Updated Vital  Signs BP (!) 148/89 (BP Location: Left Arm)   Pulse 89   Temp 98.3 F (36.8 C) (Oral)   Resp 14   Ht 6\' 2"  (1.88 m)   Wt (!) 140.2 kg   SpO2 99%   BMI 39.67 kg/m  Physical Exam Vitals and nursing note reviewed.  Constitutional:      Appearance: He is well-developed. He is obese.  HENT:     Head: Normocephalic and atraumatic.  Eyes:     Conjunctiva/sclera: Conjunctivae normal.  Cardiovascular:     Rate and Rhythm: Normal rate and regular rhythm.     Heart sounds: No murmur heard. Pulmonary:     Effort: Pulmonary effort is normal. No respiratory distress.     Breath sounds: Normal breath sounds.  Abdominal:     Palpations: Abdomen is soft.     Tenderness: There is no abdominal tenderness.  Musculoskeletal:  General: No swelling.     Cervical back: Neck supple.     Comments: right wrist with swelling, warm and tender to touch. Limited ROM, unable to extend fingers without pain, pain over his lateral epicondyle radiating up to his shoulder.   Skin:    General: Skin is warm and dry.     Capillary Refill: Capillary refill takes less than 2 seconds.  Neurological:     Mental Status: He is alert.  Psychiatric:        Mood and Affect: Mood normal.     ED Results / Procedures / Treatments   Labs (all labs ordered are listed, but only abnormal results are displayed) Labs Reviewed  CBC WITH DIFFERENTIAL/PLATELET - Abnormal; Notable for the following components:      Result Value   WBC 12.1 (*)    RBC 3.84 (*)    Hemoglobin 11.0 (*)    HCT 34.1 (*)    Neutro Abs 10.8 (*)    Lymphs Abs 0.5 (*)    All other components within normal limits  COMPREHENSIVE METABOLIC PANEL - Abnormal; Notable for the following components:   CO2 21 (*)    BUN 80 (*)    Creatinine, Ser 3.64 (*)    Albumin 3.0 (*)    AST 12 (*)    GFR, Estimated 20 (*)    All other components within normal limits  URIC ACID - Abnormal; Notable for the following components:   Uric Acid, Serum 14.8 (*)     All other components within normal limits  SEDIMENTATION RATE - Abnormal; Notable for the following components:   Sed Rate 117 (*)    All other components within normal limits  C-REACTIVE PROTEIN - Abnormal; Notable for the following components:   CRP 20.4 (*)    All other components within normal limits  BODY FLUID CULTURE W GRAM STAIN    EKG None  Radiology DG Wrist Complete Right Result Date: 02/22/2023 CLINICAL DATA:  Pain and edema. EXAM: RIGHT WRIST - COMPLETE 3 VIEW COMPARISON:  02/06/2023. FINDINGS: There is no evidence of fracture or dislocation. There is no evidence of arthropathy or other focal bone abnormality. Posterior wrist soft tissue swelling. IMPRESSION: Soft tissue swelling. No acute osseous abnormalities. Electronically Signed   By: Layla Maw M.D.   On: 02/22/2023 19:18   UE VENOUS DUPLEX (7am - 7pm) Result Date: 02/22/2023 UPPER VENOUS STUDY  Patient Name:  [name]  Date of Exam:   02/22/2023 Medical Rec #: [id number]      Accession #:    [id number] Date of Birth: Jan 29, 1975      Patient Gender: M Patient Age:   80 years Exam Location:  [facility] Procedure:      VAS [place] UPPER EXTREMITY VENOUS DUPLEX Referring Phys: Arabella Merles --------------------------------------------------------------------------------  Risk Factors: None identified. Limitations: Poor ultrasound/tissue interface and patient positioning, patient pain tolerance. Comparison Study: No prior studies. Performing Technologist: Chanda Busing RVT  Examination Guidelines: A complete evaluation includes B-mode imaging, spectral Doppler, color Doppler, and power Doppler as needed of all accessible portions of each vessel. Bilateral testing is considered an integral part of a complete examination. Limited examinations for reoccurring indications may be performed as noted.  Right Findings: +----------+------------+---------+-----------+----------+-------+ RIGHT      CompressiblePhasicitySpontaneousPropertiesSummary +----------+------------+---------+-----------+----------+-------+ IJV           Full       Yes       Yes                      +----------+------------+---------+-----------+----------+-------+  Subclavian    Full       Yes       Yes                      +----------+------------+---------+-----------+----------+-------+ Axillary      Full       Yes       Yes                      +----------+------------+---------+-----------+----------+-------+ Brachial      Full                                          +----------+------------+---------+-----------+----------+-------+ Radial        Full                                          +----------+------------+---------+-----------+----------+-------+ Ulnar         Full                                          +----------+------------+---------+-----------+----------+-------+ Cephalic      Full                                          +----------+------------+---------+-----------+----------+-------+ Basilic       Full                                          +----------+------------+---------+-----------+----------+-------+  Left Findings: +----------+------------+---------+-----------+----------+-------+ LEFT      CompressiblePhasicitySpontaneousPropertiesSummary +----------+------------+---------+-----------+----------+-------+ Subclavian    Full       Yes       Yes                      +----------+------------+---------+-----------+----------+-------+  Summary:  Right: No evidence of deep vein thrombosis in the upper extremity. No evidence of superficial vein thrombosis in the upper extremity.  Left: No evidence of thrombosis in the subclavian.  *See table(s) above for measurements and observations.    Preliminary     Procedures .Joint Aspiration/Arthrocentesis  Date/Time: 02/23/2023 4:46 AM  Performed by: Darrick Grinder,  PA-C Authorized by: Darrick Grinder, PA-C   Consent:    Consent obtained:  Verbal   Consent given by:  Patient   Risks, benefits, and alternatives were discussed: yes     Risks discussed:  Bleeding, infection, pain and nerve damage Universal protocol:    Patient identity confirmed:  Verbally with patient Location:    Location:  Wrist   Wrist:  R radiocarpal Anesthesia:    Anesthesia method:  Local infiltration   Local anesthetic:  Lidocaine 2% WITH epi Procedure details:    Preparation: Patient was prepped and draped in usual sterile fashion     Needle gauge:  22 G   Aspirate amount:  <56mL   Aspirate characteristics:  Blood-tinged Post-procedure details:    Dressing:  Adhesive bandage   Procedure completion:  Tolerated well, no immediate complications  Medications Ordered in ED Medications  lidocaine-EPINEPHrine (XYLOCAINE W/EPI) 2 %-1:200000 (PF) injection 20 mL (20 mLs Infiltration Given 02/23/23 0435)  HYDROcodone-acetaminophen (NORCO/VICODIN) 5-325 MG per tablet 1 tablet (1 tablet Oral Given 02/23/23 0328)    ED Course/ Medical Decision Making/ A&P                                 Medical Decision Making Amount and/or Complexity of Data Reviewed Labs: ordered. Radiology: ordered.  Risk Prescription drug management.   This patient presents to the ED for concern of right upper extremity swelling and pain, this involves an extensive number of treatment options, and is a complaint that carries with it a high risk of complications and morbidity.  The differential diagnosis includes septic arthritis, steroid flare, cellulitis, DVT, others   Co morbidities that complicate the patient evaluation  Type II DM, chronic diastolic heart failure, DVT   Additional history obtained:   External records from outside source obtained and reviewed including internal medicine and orthopedic notes   Lab Tests:  I Ordered, and personally interpreted labs.  The pertinent  results include: CRP 20.4, uric acid 14.8, WBC 12.1   Imaging Studies ordered:  I ordered imaging studies including plain films of the right wrist, MRI of the right wrist, upper extremity DVT study I independently visualized and interpreted imaging which showed no blood clot, soft tissue swelling on plain films, MR result pending I agree with the radiologist interpretation   Consultations Obtained:  I requested consultation with the internal medicine service, Dr.Patel,  and discussed lab and imaging findings as well as pertinent plan - they recommend: admission   Problem List / ED Course / Critical interventions / Medication management   I ordered medication including Norco for pain  Reevaluation of the patient after these medicines showed that the patient improved I have reviewed the patients home medicines and have made adjustments as needed   Test / Admission - Considered:  Patient with elevated CRP, concern for possible septic arthritis.  Joint was aspirated with only very small amount of fluid, blood-tinged.  Right extremity is warm to the touch from the area of the wrist up to the elbow with swelling noted from area of wrist up approximately halfway up the forearm. Plan to admit to medicine for further evaluation and management. MR results pending.          Final Clinical Impression(s) / ED Diagnoses Final diagnoses:  Pain and swelling of right wrist  Elevated C-reactive protein (CRP)    Rx / DC Orders ED Discharge Orders     None         Pamala Duffel 02/23/23 0536    Gilda Crease, MD 02/23/23 [phone]

## 2023-02-24 DIAGNOSIS — M25531 Pain in right wrist: Secondary | ICD-10-CM | POA: Diagnosis not present

## 2023-02-24 DIAGNOSIS — M1990 Unspecified osteoarthritis, unspecified site: Secondary | ICD-10-CM | POA: Diagnosis not present

## 2023-02-24 DIAGNOSIS — M25431 Effusion, right wrist: Secondary | ICD-10-CM | POA: Diagnosis not present

## 2023-02-24 DIAGNOSIS — M064 Inflammatory polyarthropathy: Secondary | ICD-10-CM | POA: Diagnosis not present

## 2023-02-24 LAB — BASIC METABOLIC PANEL
Anion gap: 10 (ref 5–15)
BUN: 93 mg/dL — ABNORMAL HIGH (ref 6–20)
CO2: 21 mmol/L — ABNORMAL LOW (ref 22–32)
Calcium: 8.7 mg/dL — ABNORMAL LOW (ref 8.9–10.3)
Chloride: 100 mmol/L (ref 98–111)
Creatinine, Ser: 3.23 mg/dL — ABNORMAL HIGH (ref 0.61–1.24)
GFR, Estimated: 23 mL/min — ABNORMAL LOW (ref >60)
Glucose, Bld: 323 mg/dL — ABNORMAL HIGH (ref 70–99)
Potassium: 4.2 mmol/L (ref 3.5–5.1)
Sodium: 131 mmol/L — ABNORMAL LOW (ref 135–145)

## 2023-02-24 LAB — GLUCOSE, CAPILLARY
Glucose-Capillary: 294 mg/dL — ABNORMAL HIGH (ref 70–99)
Glucose-Capillary: 341 mg/dL — ABNORMAL HIGH (ref 70–99)
Glucose-Capillary: 295 mg/dL — ABNORMAL HIGH (ref 70–99)
Glucose-Capillary: 319 mg/dL — ABNORMAL HIGH (ref 70–99)
Glucose-Capillary: 319 mg/dL — ABNORMAL HIGH (ref 70–99)

## 2023-02-24 LAB — CBC
HCT: 28.8 % — ABNORMAL LOW (ref 39.0–52.0)
Hemoglobin: 9.3 g/dL — ABNORMAL LOW (ref 13.0–17.0)
MCH: 28 pg (ref 26.0–34.0)
MCHC: 32.3 g/dL (ref 30.0–36.0)
MCV: 86.7 fL (ref 80.0–100.0)
Platelets: 277 10*3/uL (ref 150–400)
RBC: 3.32 MIL/uL — ABNORMAL LOW (ref 4.22–5.81)
RDW: 14.6 % (ref 11.5–15.5)
WBC: 8.7 10*3/uL (ref 4.0–10.5)
nRBC: 0 % (ref 0.0–0.2)

## 2023-02-24 MED ORDER — POLYETHYLENE GLYCOL 3350 17 G PO PACK
17.0000 g | PACK | Freq: Every day | ORAL | Status: DC
  Filled 2023-02-24 (×2): qty 1

## 2023-02-24 MED ORDER — HYDROMORPHONE HCL 2 MG PO TABS
2.0000 mg | ORAL_TABLET | ORAL | Status: DC | PRN
  Administered 2023-02-24 – 2023-02-25 (×4): 2 mg via ORAL
  Filled 2023-02-24 (×4): qty 1

## 2023-02-24 MED ORDER — INSULIN ASPART 100 UNIT/ML IJ SOLN
5.0000 [IU] | Freq: Three times a day (TID) | INTRAMUSCULAR | Status: DC
  Administered 2023-02-24: 5 [IU] via SUBCUTANEOUS

## 2023-02-24 MED ORDER — PREDNISONE 10 MG PO TABS
60.0000 mg | ORAL_TABLET | Freq: Every day | ORAL | Status: DC
  Administered 2023-02-25 – 2023-02-26 (×2): 60 mg via ORAL
  Filled 2023-02-24 (×2): qty 6

## 2023-02-24 MED ORDER — PREDNISONE 10 MG PO TABS: 60.0000 mg | ORAL_TABLET | Freq: Every day | ORAL | Status: DC

## 2023-02-24 MED ORDER — INSULIN GLARGINE-YFGN 100 UNIT/ML ~~LOC~~ SOLN
40.0000 [IU] | Freq: Every day | SUBCUTANEOUS | Status: DC
  Filled 2023-02-24: qty 0.4

## 2023-02-24 MED ORDER — HYDROMORPHONE HCL 1 MG/ML IJ SOLN
0.5000 mg | Freq: Once | INTRAMUSCULAR | Status: AC | PRN
  Administered 2023-02-24: 0.5 mg via INTRAVENOUS
  Filled 2023-02-24: qty 0.5

## 2023-02-24 MED ORDER — PREDNISONE 10 MG PO TABS
20.0000 mg | ORAL_TABLET | Freq: Once | ORAL | Status: AC
  Administered 2023-02-24: 20 mg via ORAL
  Filled 2023-02-24: qty 2

## 2023-02-24 NOTE — Progress Notes (Signed)
Over the course of this shift 2/14, pt's blood glucose was 310, scheduled insulin given, rechecked blood glucose was 294. Dr.notified per above. No new orders. Pt questioned tylenol being given concerned with worsening kidneys and requested naproxen (pt usually takes). Dr. Truitt Merle. Dr. Algis Downs that "tylenol usually doesn't affect kidneys but naproxen most likely does, hence holding this med." This nurse relayed per above to pt. Pt's last bm was 2/9 but pt said that's normal. Pt stated "takes 2 laxatives per week to clean out system. Mounjaro doesn't leave much of an appetite." Pt denied constipation and stool softeners. Dr. Truitt Merle. No new orders.

## 2023-02-25 DIAGNOSIS — M064 Inflammatory polyarthropathy: Secondary | ICD-10-CM | POA: Diagnosis not present

## 2023-02-25 LAB — GLUCOSE, CAPILLARY
Glucose-Capillary: 253 mg/dL — ABNORMAL HIGH (ref 70–99)
Glucose-Capillary: 272 mg/dL — ABNORMAL HIGH (ref 70–99)
Glucose-Capillary: 286 mg/dL — ABNORMAL HIGH (ref 70–99)
Glucose-Capillary: 300 mg/dL — ABNORMAL HIGH (ref 70–99)
Glucose-Capillary: 309 mg/dL — ABNORMAL HIGH (ref 70–99)
Glucose-Capillary: 400 mg/dL — ABNORMAL HIGH (ref 70–99)
Glucose-Capillary: 421 mg/dL — ABNORMAL HIGH (ref 70–99)

## 2023-02-25 LAB — CBC
HCT: 29 % — ABNORMAL LOW (ref 39.0–52.0)
Hemoglobin: 9.5 g/dL — ABNORMAL LOW (ref 13.0–17.0)
MCH: 28.5 pg (ref 26.0–34.0)
MCHC: 32.8 g/dL (ref 30.0–36.0)
MCV: 87.1 fL (ref 80.0–100.0)
Platelets: 273 10*3/uL (ref 150–400)
RBC: 3.33 MIL/uL — ABNORMAL LOW (ref 4.22–5.81)
RDW: 14.4 % (ref 11.5–15.5)
WBC: 8.8 10*3/uL (ref 4.0–10.5)
nRBC: 0 % (ref 0.0–0.2)

## 2023-02-25 LAB — BASIC METABOLIC PANEL
Anion gap: 15 (ref 5–15)
BUN: 89 mg/dL — ABNORMAL HIGH (ref 6–20)
CO2: 19 mmol/L — ABNORMAL LOW (ref 22–32)
Calcium: 9.2 mg/dL (ref 8.9–10.3)
Chloride: 100 mmol/L (ref 98–111)
Creatinine, Ser: 2.85 mg/dL — ABNORMAL HIGH (ref 0.61–1.24)
GFR, Estimated: 27 mL/min — ABNORMAL LOW (ref >60)
Glucose, Bld: 294 mg/dL — ABNORMAL HIGH (ref 70–99)
Potassium: 4.1 mmol/L (ref 3.5–5.1)
Sodium: 134 mmol/L — ABNORMAL LOW (ref 135–145)

## 2023-02-25 MED ORDER — INSULIN ASPART 100 UNIT/ML IJ SOLN
7.0000 [IU] | Freq: Three times a day (TID) | INTRAMUSCULAR | Status: DC
  Administered 2023-02-25 – 2023-02-26 (×6): 7 [IU] via SUBCUTANEOUS

## 2023-02-25 MED ORDER — INSULIN ASPART 100 UNIT/ML IJ SOLN
10.0000 [IU] | Freq: Once | INTRAMUSCULAR | Status: AC
  Administered 2023-02-25: 10 [IU] via SUBCUTANEOUS

## 2023-02-25 MED ORDER — FUROSEMIDE 40 MG PO TABS
40.0000 mg | ORAL_TABLET | Freq: Every day | ORAL | Status: DC
Start: 2023-02-25 — End: 2023-02-27
  Administered 2023-02-25 – 2023-02-26 (×2): 40 mg via ORAL
  Filled 2023-02-25 (×2): qty 1

## 2023-02-25 MED ORDER — PANTOPRAZOLE SODIUM 20 MG PO TBEC
20.0000 mg | DELAYED_RELEASE_TABLET | Freq: Every day | ORAL | Status: DC
  Administered 2023-02-25 – 2023-02-26 (×2): 20 mg via ORAL
  Filled 2023-02-25 (×2): qty 1

## 2023-02-25 MED ORDER — INSULIN GLARGINE-YFGN 100 UNIT/ML ~~LOC~~ SOLN
45.0000 [IU] | Freq: Every day | SUBCUTANEOUS | Status: DC
  Administered 2023-02-25: 45 [IU] via SUBCUTANEOUS
  Filled 2023-02-25 (×2): qty 0.45

## 2023-02-25 NOTE — Progress Notes (Incomplete)
Pt is concerned with blood glucose level. He understands that steroids are driving glucose level but he thinks that he's not receiving enough insulin. He wants to increase his sliding scale. Previous blood glucose was 319 gave 4 Units, rechecked now 309. Dr. Truitt Merle per above. 10 Units ordered and given.

## 2023-02-25 NOTE — Plan of Care (Signed)

## 2023-02-25 NOTE — Progress Notes (Signed)
Subjective:  [name] is a 48 y.o. with a pertinent PMH of T2DM on insulin, tenosynovitis with recent steroid injections, HFpEF, CKD stage 4, recurrent VTE on Eliquis, and R above knee amputation, who presented with R wrist pain and swelling and admitted for acute gout flare.   Today, patient is concerned about his glucose levels and the amount of insulin we are administering. He does not believe that we are treating his DM appropriately with the SSI amount. Patient also has concerns about his BP now that we have stopped the chlorthalidone and would like [place] to reach out to his nephrologist. His R wrist pain is improving, he is still experiencing pain in his R shoulder and R elbow with movement. Transfer in the wheelchair is easier today.   Objective:  Vital signs in last 24 hours: Vitals:   02/24/23 0730 02/24/23 1555 02/24/23 2011 02/25/23 0723  BP: (!) 149/67 (!) 113/47 (!) 145/79 (!) 142/84  Pulse: 89 85 84 86  Resp: 18 16 18 16   Temp: 98.1 F (36.7 C) 98.2 F (36.8 C) 98.4 F (36.9 C) 98.1 F (36.7 C)  TempSrc: Oral Oral Oral Oral  SpO2: 99% 97% 94% 97%  Weight:      Height:       Physical Exam: General:NAD, sitting in wheelchair Cardiac:RRR, no murmur appreciated  Pulmonary:clear to auscultate bilaterally, normal effort on room air  Neuro:awake, alert  MSK:No pitting edema of LLE, R wrist edema and warmth have improved, tenderness to palpitation of the R wrist is present  Skin:warm and dry Psych:  Normal mood and affect      Latest Ref Rng & Units 02/25/2023    3:27 AM 02/24/2023    4:42 AM 02/22/2023    5:33 PM  CBC  WBC 4.0 - 10.5 K/uL 8.8  8.7  12.1   Hemoglobin 13.0 - 17.0 g/dL 9.5  9.3  16.1   Hematocrit 39.0 - 52.0 % 29.0  28.8  34.1   Platelets 150 - 400 K/uL 273  277  277        Latest Ref Rng & Units 02/25/2023    3:27 AM 02/24/2023    4:42 AM 02/22/2023    5:33 PM  BMP  Glucose 70 - 99 mg/dL 096  045  89   BUN 6 - 20 mg/dL 89  93  80   Creatinine  0.61 - 1.24 mg/dL 4.09  8.11  9.14   Sodium 135 - 145 mmol/L 134  131  137   Potassium 3.5 - 5.1 mmol/L 4.1  4.2  4.5   Chloride 98 - 111 mmol/L 100  100  101   CO2 22 - 32 mmol/L 19  21  21    Calcium 8.9 - 10.3 mg/dL 9.2  8.7  9.7      MRI:  -Radiocarpal joint effusion with synovitis. Small midcarpal joint effusion. Diffuse intramuscular and subcutaneous edema, most pronounced dorsally. -Tenosynovitis of the second and third extensor compartment tendons, which can be seen in the setting of distal intersection syndrome   Assessment/Plan:  Principal Problem:   Inflammatory arthropathy Active Problems:   Insulin dependent type 2 diabetes mellitus, controlled (HCC)   History of DVT (deep vein thrombosis)   CKD (chronic kidney disease) stage 4, GFR 15-29 ml/min (HCC)   Chronic diastolic heart failure (HCC)   Dyslipidemia   Urinary retention   Diabetic neuropathy (HCC)   Inflammatory arthropathy, suspect 2/2 gout Tenosynovitis  On day 3 of oral prednisone,  currently on a dose of 60mg . Patient received both oral and IV dilaudid yesterday for severe pain. His pain is better controlled today. He is able to use his wheel chair more easily today, but is still experiencing difficulties with transfer due to the R wrist pain. Plan: -Prednisone 60mg  daily, will taper down by 10 mg weekly  -Tylenol, dilaudid prn for pain  -OT/PT consult today    Insulin-dependent T2DM Diabetic neuropathy CBG continues to remain elevated in the upper 200s due to current steroid therapy. He received a total of 96 units of insulin yesterday, 60 units of those were Aspart. -Semglee increased to 45 units -Meal time insulin 7 units TID. -Resistant SSI -Night time correction   Chronic Stable Issues   History DVT/PE Chronically anticoagulated.  -Continue eliquis    CKD stage 4 Monitor. Baseline Cr around 3.6. Avoid nephrotoxic medications re pain and gout treatment.   Chronic Diastolic heart  failure -Lasix 80 BID at home. Will begin Lasix 40mg  today.    Overactive Bladder -Continue oxybutynin 5 TID   HTN Will continue regimen of Amlodipine 10 at bedtime , Coreg 25 BID, Hydralazine 100 TID -recommend d/c chlorthalidone at discharge due to current gout flare    Chronic Pain Voltaren gel Tylenol   Constipation Continue Miralax PRN   HLD Continue Pravastatin 10 every day   BPH Continue Flomax at bedtime   Resolved Problems:  __________________________________  Code Status: Full  VTE Prophylaxis:Eliquis  Diet:Card modified  IVF:N/A Barriers to Discharge: Medical treatment Dispo: Anticipated discharge in approximately 1-2 day(s).   Faith Rogue, DO 02/25/2023, 8:59 AM Pager: [phone] After 5pm on weekdays and 1pm on weekends: On Call pager [phone]

## 2023-02-26 ENCOUNTER — Encounter (HOSPITAL_COMMUNITY): Payer: Self-pay

## 2023-02-26 ENCOUNTER — Other Ambulatory Visit (HOSPITAL_COMMUNITY): Payer: Self-pay

## 2023-02-26 ENCOUNTER — Other Ambulatory Visit: Payer: Self-pay | Admitting: Student

## 2023-02-26 DIAGNOSIS — M064 Inflammatory polyarthropathy: Secondary | ICD-10-CM | POA: Diagnosis not present

## 2023-02-26 DIAGNOSIS — M25531 Pain in right wrist: Secondary | ICD-10-CM | POA: Diagnosis not present

## 2023-02-26 DIAGNOSIS — N184 Chronic kidney disease, stage 4 (severe): Secondary | ICD-10-CM | POA: Diagnosis not present

## 2023-02-26 DIAGNOSIS — Z794 Long term (current) use of insulin: Secondary | ICD-10-CM | POA: Diagnosis not present

## 2023-02-26 DIAGNOSIS — M109 Gout, unspecified: Secondary | ICD-10-CM

## 2023-02-26 DIAGNOSIS — M25431 Effusion, right wrist: Secondary | ICD-10-CM | POA: Diagnosis not present

## 2023-02-26 DIAGNOSIS — E1122 Type 2 diabetes mellitus with diabetic chronic kidney disease: Secondary | ICD-10-CM

## 2023-02-26 DIAGNOSIS — M1990 Unspecified osteoarthritis, unspecified site: Secondary | ICD-10-CM | POA: Diagnosis not present

## 2023-02-26 LAB — BASIC METABOLIC PANEL
Anion gap: 9 (ref 5–15)
BUN: 86 mg/dL — ABNORMAL HIGH (ref 6–20)
CO2: 20 mmol/L — ABNORMAL LOW (ref 22–32)
Calcium: 8.7 mg/dL — ABNORMAL LOW (ref 8.9–10.3)
Chloride: 102 mmol/L (ref 98–111)
Creatinine, Ser: 2.64 mg/dL — ABNORMAL HIGH (ref 0.61–1.24)
GFR, Estimated: 29 mL/min — ABNORMAL LOW (ref >60)
Glucose, Bld: 258 mg/dL — ABNORMAL HIGH (ref 70–99)
Potassium: 4.4 mmol/L (ref 3.5–5.1)
Sodium: 131 mmol/L — ABNORMAL LOW (ref 135–145)

## 2023-02-26 LAB — CBC
HCT: 27.5 % — ABNORMAL LOW (ref 39.0–52.0)
Hemoglobin: 9.1 g/dL — ABNORMAL LOW (ref 13.0–17.0)
MCH: 28.7 pg (ref 26.0–34.0)
MCHC: 33.1 g/dL (ref 30.0–36.0)
MCV: 86.8 fL (ref 80.0–100.0)
Platelets: 265 10*3/uL (ref 150–400)
RBC: 3.17 MIL/uL — ABNORMAL LOW (ref 4.22–5.81)
RDW: 14.2 % (ref 11.5–15.5)
WBC: 8.4 10*3/uL (ref 4.0–10.5)
nRBC: 0 % (ref 0.0–0.2)

## 2023-02-26 LAB — BODY FLUID CULTURE W GRAM STAIN: Culture: NO GROWTH

## 2023-02-26 LAB — GLUCOSE, CAPILLARY
Glucose-Capillary: 220 mg/dL — ABNORMAL HIGH (ref 70–99)
Glucose-Capillary: 215 mg/dL — ABNORMAL HIGH (ref 70–99)
Glucose-Capillary: 213 mg/dL — ABNORMAL HIGH (ref 70–99)

## 2023-02-26 MED ORDER — INSULIN ASPART (W/NIACINAMIDE) 100 UNIT/ML ~~LOC~~ SOPN
10.0000 [IU] | PEN_INJECTOR | Freq: Three times a day (TID) | SUBCUTANEOUS | 0 refills | Status: DC
  Filled 2023-02-26: qty 15, 30d supply, fill #0

## 2023-02-26 MED ORDER — INSUPEN PEN NEEDLES 32G X 4 MM MISC
0 refills | Status: DC
  Filled 2023-02-26: qty 100, 50d supply, fill #0

## 2023-02-26 MED ORDER — INSULIN GLARGINE-YFGN 100 UNIT/ML ~~LOC~~ SOLN
55.0000 [IU] | Freq: Every day | SUBCUTANEOUS | Status: DC
  Administered 2023-02-26: 55 [IU] via SUBCUTANEOUS
  Filled 2023-02-26: qty 0.55

## 2023-02-26 MED ORDER — PREDNISONE 20 MG PO TABS
ORAL_TABLET | ORAL | 0 refills | Status: DC
  Filled 2023-02-26: qty 47, 23d supply, fill #0

## 2023-02-26 MED ORDER — PANTOPRAZOLE SODIUM 20 MG PO TBEC
20.0000 mg | DELAYED_RELEASE_TABLET | Freq: Every day | ORAL | 0 refills | Status: DC
  Filled 2023-02-26: qty 25, 25d supply, fill #0

## 2023-02-26 MED ORDER — TRESIBA FLEXTOUCH 200 UNIT/ML ~~LOC~~ SOPN
54.0000 [IU] | PEN_INJECTOR | SUBCUTANEOUS | 0 refills | Status: DC
  Filled 2023-02-26: qty 15, 55d supply, fill #0

## 2023-02-26 MED ORDER — ACETAMINOPHEN 325 MG PO TABS
650.0000 mg | ORAL_TABLET | Freq: Four times a day (QID) | ORAL | 0 refills | Status: AC
  Filled 2023-02-26: qty 30, 4d supply, fill #0

## 2023-02-26 NOTE — Evaluation (Signed)
Occupational Therapy Evaluation Patient Details Name: [name] MRN: [id number] DOB: December 09, 1975 Today's Date: 02/26/2023   History of Present Illness   [name] is a 48 y.o. male who presents with continued left upper extremity pain and swelling; seen at Ortho clinic for [employer] in January, steriod injection; post-injection flare after that, and steroids helped until 2/10, when pain and swelling returned, resulting in this admission;  has a past medical history of R AKA, Acute venous embolism and thrombosis of deep vessels of proximal lower extremity (HCC) (07/19/2011), Anxiety, Chest pain, CHF (congestive heart failure)  Chronic renal disease, stage IV , Depression, , DVT, ED (erectile dysfunction),Hyperlipidemia, Hypertension, Nephrotic syndrome, Obesity, OSA, Peripheral edema, Pneumonia, Pulmonary embolism ), Pyelonephritis, Type I diabetes mellitus, and Urinary retention.     Clinical Impressions Pt evaluated s/p the admission list above. At baseline, pt lives alone, a Consulting civil engineer at [employer], and completes all ADLs and functional mobility with MOD I. Pt as a personal care attendant M-F, 8hrs a day to assist with cleaning and cooking. Upon evaluation, pt was limited due to R wrist pain (6/10 LOP). Overall, pt completed functional squat pivot transfers from EOB to wheelchair independently. Pt reported increased discomfort with current brace due to immobilization of R thumb and requested a new brace. Pt educated on wrist cock up brace to continue providing R wrist support but allow movement of R thumb. Pt verbalized understanding and was in agreement to brace. Brace was ordered for pt. Pt expressed concerns with completing grooming tasks upon admission. With return of function and mobility of RUE, pt reports no longer having those concerns and is able to complete those tasks without difficulty. Pt also verbalized the need to improve strength in RUE. Pt provided with theraband and  educated on increasing weight progressively when engaging in resistive exercises. Pt verbalized understanding. OT to continue following pt acutely to maximize functional independence without recommendation of follow-up OT services.      If plan is discharge home, recommend the following:   Assistance with cooking/housework;Assist for transportation     Functional Status Assessment   Patient has had a recent decline in their functional status and demonstrates the ability to make significant improvements in function in a reasonable and predictable amount of time.     Equipment Recommendations   None recommended by OT     Recommendations for Other Services         Precautions/Restrictions   Precautions Precautions: Other (comment) Restrictions Weight Bearing Restrictions Per Provider Order: No     Mobility Bed Mobility Overal bed mobility: Modified Independent             General bed mobility comments: Pt seated EOB upon arrival. Able to reposition himself without physical assistance    Transfers Overall transfer level: Independent                 General transfer comment: Pt performed squat pivot transfer from EOB to wheelchair without physical assistance.      Balance Overall balance assessment: Needs assistance Sitting-balance support: No upper extremity supported, Feet supported Sitting balance-[name]: Good Sitting balance - Comments: static sitting EOB                                   ADL either performed or assessed with clinical judgement   ADL Overall ADL's : Modified independent;Independent  General ADL Comments: Pt had concerns about grooming due to being unable to functionally use RUE upon admission. Pt is now able to use RUE functionally and has full ROM and declines further concerns with grooming tasks     Vision Baseline Vision/History: 0 No visual  deficits Ability to See in Adequate Light: 0 Adequate Patient Visual Report: No change from baseline Vision Assessment?: No apparent visual deficits     Perception Perception: Within Functional Limits       Praxis Praxis: Not tested       Pertinent Vitals/Pain Pain Assessment Pain Assessment: 0-10 Pain Score: 6  Pain Location: R wrist Pain Descriptors / Indicators: Aching, Discomfort Pain Intervention(s): Limited activity within patient's tolerance, Monitored during session     Extremity/Trunk Assessment Upper Extremity Assessment Upper Extremity Assessment: Overall WFL for tasks assessed RUE Deficits / Details: Pain and swelling, wrist and hand   Lower Extremity Assessment Lower Extremity Assessment: Defer to PT evaluation RLE Deficits / Details: previous AKA   Cervical / Trunk Assessment Cervical / Trunk Assessment: Normal   Communication Communication Communication: No apparent difficulties   Cognition Arousal: Alert Behavior During Therapy: WFL for tasks assessed/performed Cognition: No apparent impairments             OT - Cognition Comments: answered all questions appropriately. followed multi-step verbal commands. able to express wants and needs. Required verbal cues for safety, became frustrated when OT student was standing close when pt was transferring OOB on 1st attempt.                 Following commands: Intact       Cueing  General Comments   Cueing Techniques: Verbal cues      Exercises     Shoulder Instructions      Home Living Family/patient expects to be discharged to:: Private residence Living Arrangements: Alone Available Help at Discharge: Personal care attendant;Available PRN/intermittently Type of Home: Apartment Home Access: Level entry     Home Layout: One level     Bathroom Shower/Tub: Tub/shower unit         Home Equipment: Agricultural consultant (2 wheels);Wheelchair - manual;Tub bench          Prior  Functioning/Environment Prior Level of Function : Independent/Modified Independent;Other (comment)             Mobility Comments: Uses a RW in the home; uses a wheelchair for community access; keeps a RW at teh gym to use ADLs Comments: Independent with ADLs; aide assists with cooking and cleaning    OT Problem List: Decreased strength;Pain   OT Treatment/Interventions:        OT Goals(Current goals can be found in the care plan section)   Acute Rehab OT Goals Patient Stated Goal: go home OT Goal Formulation: With patient Time For Goal Achievement: 03/12/23 Potential to Achieve Goals: Good ADL Goals Pt/caregiver will Perform Home Exercise Program: Increased strength;Both right and left upper extremity;With theraband;Independently;With written HEP provided Additional ADL Goal #1: Pt will complete all ADLs independently   OT Frequency:       Co-evaluation              AM-PAC OT "6 Clicks" Daily Activity     Outcome Measure Help from another person eating meals?: None Help from another person taking care of personal grooming?: None Help from another person toileting, which includes using toliet, bedpan, or urinal?: None Help from another person bathing (including washing, rinsing, drying)?: None Help from another  person to put on and taking off regular upper body clothing?: None Help from another person to put on and taking off regular lower body clothing?: None 6 Click Score: 24   End of Session Equipment Utilized During Treatment: Other (comment) (wheelchair) Nurse Communication: Mobility status  Activity Tolerance: Patient tolerated treatment well Patient left: in bed;with call bell/phone within reach  OT Visit Diagnosis: Pain;Muscle weakness (generalized) (M62.81) Pain - Right/Left: Right Pain - part of body: Hand;Shoulder (wrist)                Time: 1308-6578 OT Time Calculation (min): 22 min Charges:     Lynnda Shields 02/26/2023,  2:40 PM

## 2023-02-26 NOTE — Progress Notes (Signed)
Orthopedic Tech Progress Note Patient Details:  [name] [birth year] [id number]  Ortho Devices Type of Ortho Device: Wrist splint Ortho Device/Splint Location: RUE Ortho Device/Splint Interventions: Ordered, Application, Adjustment   Post Interventions Patient Tolerated: Well Instructions Provided: Care of device  Donald Pore 02/26/2023, 3:00 PM

## 2023-02-26 NOTE — TOC Transition Note (Signed)
Transition of Care [facility]) - Discharge Note   Patient Details  Name: [name] MRN: [id number] Date of Birth: [birth year]  [facility] of [facility]) CM/SW Contact:  Harriet Masson, RN Phone Number: 02/26/2023, 3:33 PM   Clinical Narrative:    Patient stable for discharge.  Patient is agreeable to OP rehab to [facility] in [place].  Referral sent.  Patient requesting shower bench. Notified [employer] of order.  Patient has transportation  home and to apts.    Final next level of care: OP Rehab Barriers to Discharge: Barriers Resolved   Patient Goals and CMS Choice Patient states their goals for this hospitalization and ongoing recovery are:: return home CMS Medicare.gov Compare Post Acute Care list provided to:: Patient Choice offered to / list presented to : Patient      Discharge Placement                 home      Discharge Plan and Services Additional resources added to the After Visit Summary for                  DME Arranged: Other see comment (shower bench) DME Agency: [employer]                  Social Drivers of Health (SDOH) Interventions SDOH Screenings   Food Insecurity: No Food Insecurity (02/23/2023)  Housing: Low Risk  (02/23/2023)  Transportation Needs: No Transportation Needs (02/23/2023)  Utilities: Not At Risk (02/23/2023)  Alcohol Screen: Low Risk  (01/31/2023)  Depression (PHQ2-9): Low Risk  (01/31/2023)  Financial Resource Strain: Medium Risk (01/31/2023)  Physical Activity: Sufficiently Active (01/31/2023)  Social Connections: Socially Isolated (01/31/2023)  Stress: No Stress Concern Present (01/31/2023)  Tobacco Use: Low Risk  (02/22/2023)  Health Literacy: Adequate Health Literacy (01/31/2023)     Readmission Risk Interventions    10/03/2020    3:23 PM 08/23/2020    2:46 PM  Readmission Risk Prevention Plan  Transportation Screening Complete Complete  PCP or Specialist Appt within 3-5 Days Complete Complete  HRI or Home Care  Consult Complete Complete  Social Work Consult for Recovery Care Planning/Counseling Complete Complete  Palliative Care Screening Not Applicable Not Applicable  Medication Review Oceanographer) Complete Complete

## 2023-02-26 NOTE — Evaluation (Signed)
Physical Therapy Evaluation Patient Details Name: [name] MRN: [id number] DOB: 08/06/1975 Today's Date: 02/26/2023  History of Present Illness  [name] is a 48 y.o. male who presents with continued left upper extremity pain and swelling; seen at Ortho clinic for [employer] in January, steriod injection; post-injection flare after that, and steroids helped until 2/10, when pain and swelling returned, resulting in this admission;  has a past medical history of R AKA, Acute venous embolism and thrombosis of deep vessels of proximal lower extremity (HCC) (07/19/2011), Anxiety, Chest pain, CHF (congestive heart failure)  Chronic renal disease, stage IV , Depression, , DVT, ED (erectile dysfunction),Hyperlipidemia, Hypertension, Nephrotic syndrome, Obesity, OSA, Peripheral edema, Pneumonia, Pulmonary embolism ), Pyelonephritis, Type I diabetes mellitus, and Urinary retention.  Clinical Impression   Pt admitted with above diagnosis. Lives at home alone, in a single-level home with a level entry; Prior to admission, pt was independent with transfers, including tub transfers with bench, no need for a sliding board; RUE pain since January of this year began to effect transfers; Normally pt goes to the gym regularly (has a RW at his gym, too); uses RW in his home; Aide helps more with home management (cooking, cleaning); Presents to PT with RUE pain effecting ability to transfer;  Assisted pt with bolstering/positioning RUE for elevation and full support; Demonstrated use of bolsters for bed mobility to elbow prop if R hand and and wrist are particularly painful;  Pt currently with functional limitations due to the deficits listed below (see PT Problem List). Pt will benefit from skilled PT to increase their independence and safety with mobility to allow discharge to the venue listed below.       He is politely declining getting to EOB and transferring with me at this time, but I  anticipate he will move and transfer well; when asked if he felt comfortable with his ability to transfer if he goes home today he replied yes;   He requests that OT sees him close to noon or after today;       If plan is discharge home, recommend the following: A little help with walking and/or transfers;Assistance with cooking/housework   Can travel by private vehicle        Equipment Recommendations None recommended by PT  Recommendations for Other Services  OT consult (as ordered)    Functional Status Assessment Patient has had a recent decline in their functional status and demonstrates the ability to make significant improvements in function in a reasonable and predictable amount of time.     Precautions / Restrictions Precautions Precautions: Other (comment) Precaution/Restrictions Comments: Keep RUE elevated above heart Restrictions Weight Bearing Restrictions Per Provider Order: No      Mobility  Bed Mobility Overal bed mobility: Modified Independent             General bed mobility comments: Rolls and repositions in bed without difficulty    Transfers                   General transfer comment: Politely declining OOB this morning    Ambulation/Gait                  Stairs            Wheelchair Mobility     Tilt Bed    Modified Rankin (Stroke Patients Only)       Balance  Pertinent Vitals/Pain Pain Assessment Pain Assessment: 0-10 Pain Score: 6  Pain Location: R wrist and hand, radiating to elbow and shoulder Pain Descriptors / Indicators: Aching Pain Intervention(s): Monitored during session, Other (comment) (Elevated extremity)    Home Living Family/patient expects to be discharged to:: Private residence Living Arrangements: Alone Available Help at Discharge: Personal care attendant;Available PRN/intermittently (M-F 8 hours) Type of Home:  Apartment Home Access: Level entry       Home Layout: One level Home Equipment: Agricultural consultant (2 wheels);Wheelchair - manual;Tub bench      Prior Function Prior Level of Function : Independent/Modified Independent;Other (comment) (Loss adjuster, chartered at [employer])             Mobility Comments: Uses a RW in the home; uses a wheelchair for community access; keeps a RW at teh gym to use ADLs Comments: Independent with basic ADLs; tells me his Aide helps with cooking and cleaning     Extremity/Trunk Assessment   Upper Extremity Assessment Upper Extremity Assessment: Defer to OT evaluation;Right hand dominant;RUE deficits/detail RUE Deficits / Details: Pain and swelling, wrist and hand    Lower Extremity Assessment Lower Extremity Assessment: RLE deficits/detail RLE Deficits / Details: previous AKA       Communication   Communication Communication: No apparent difficulties    Cognition Arousal: Alert Behavior During Therapy: WFL for tasks assessed/performed   PT - Cognitive impairments: No apparent impairments                         Following commands: Intact       Cueing Cueing Techniques: Verbal cues, Visual cues     General Comments General comments (skin integrity, edema, etc.): Discussed and demonstrated positioning and bolstering for RUE elevation; [name] is very interested in exercises for strengthening his R shoulder, wrist, and hand; discussed some options and notified OT    Exercises     Assessment/Plan    PT Assessment Patient needs continued PT services  PT Problem List Decreased strength;Decreased range of motion;Pain       PT Treatment Interventions DME instruction;Gait training;Functional mobility training;Therapeutic activities;Therapeutic exercise;Balance training;Neuromuscular re-education;Cognitive remediation;Patient/family education;Wheelchair mobility training;Manual techniques    PT Goals (Current goals can be found  in the Care Plan section)  Acute Rehab PT Goals Patient Stated Goal: wants to get back to the gym PT Goal Formulation: With patient Time For Goal Achievement: 03/12/23 Potential to Achieve Goals: Good    Frequency Min 1X/week     Co-evaluation               AM-PAC PT "6 Clicks" Mobility  Outcome Measure Help needed turning from your back to your side while in a flat bed without using bedrails?: None Help needed moving from lying on your back to sitting on the side of a flat bed without using bedrails?: A Little Help needed moving to and from a bed to a chair (including a wheelchair)?: A Little Help needed standing up from a chair using your arms (e.g., wheelchair or bedside chair)?: A Little Help needed to walk in hospital room?: Total Help needed climbing 3-5 steps with a railing? : Total 6 Click Score: 15    End of Session Equipment Utilized During Treatment: Other (comment) (extra pillows and bolsters) Activity Tolerance: Patient tolerated treatment well Patient left: in bed;with call bell/phone within reach Nurse Communication: Other (comment) (pt status) PT Visit Diagnosis: Pain;Muscle weakness (generalized) (M62.81);Other abnormalities of gait and mobility (R26.89) Pain -  Right/Left: Right Pain - part of body: Arm;Hand    Time: 1610-9604 PT Time Calculation (min) (ACUTE ONLY): 30 min   Charges:   PT Evaluation $PT Eval Low Complexity: 1 Low PT Treatments $Therapeutic Activity: 8-22 mins PT General Charges $$ ACUTE PT VISIT: 1 Visit         Van Clines, PT  Acute Rehabilitation Services Office [phone] Secure Chat welcomed   Levi Aland 02/26/2023, 12:48 PM

## 2023-02-26 NOTE — Discharge Instructions (Addendum)
You came to the hospital for wrist pain and you were diagnosed with gout.  We treated you with a steroid called prednisone.    *For your Gout -We have started you on these following medications:  -Prednisone You will take 60mg  for 10 days, your last dose will be on 2/24, you will then take 50mg  of prednisone for 3 days, then 40mg  for 3 days, then 30mg  for 3 days, then 20mg  for 3 days, and 10mg  for 3 days             -Protonix, take this medication every morning 30-60 min prior to breakfast, this helps protect your stomach form the prednisone   -We have stopped the chlorthalidone -We will adjust the Lasix dose to 80mg  daily    -It is very important to see your PCP office within the next 7-10 days  *For your Diabetes  -We have started you on these following medications while you are taking the prednisone:  -Tresiba 54 units every morning  -Please continue your sliding scale insulin with meals  -Aspart (short acting insulin) 10 units with every meal   -Please follow up with your endocrinologist             -If your fasting blood glucose in the morning remains elevated (greater than 300mg  every morning) please give the clinic a call and your endocrinologist office for advice on changing your insulin regimen.  If your fasting blood glucose level or any blood glucose reading throughout the day is below 70, please call the either our clinic or the endocrinologist office  Follow-up appointments: PCP: 2/24 at 10:45am   If you have any questions or concerns please feel free to call: Internal medicine clinic at [phone]   If you have any of these following symptoms, please call us or seek care at an emergency department: -Chest Pain -Difficulty Breathing -Syncope (passing out) -Drooping of face -Slurred speech -Sudden weakness in your leg or arm -Fever -Chills   We are glad that you are feeling better, it was a pleasure to care for you!  Faith Rogue DO

## 2023-02-26 NOTE — Discharge Summary (Signed)
Name: [name] MRN: [id number] DOB: 1975/11/05 48 y.o. PCP: Gust Rung, DO  Date of Admission: 02/22/2023  5:03 PM Date of Discharge:  02/26/2023 Attending Physician: Dr.  Sol Blazing  DISCHARGE DIAGNOSIS:  Primary Problem: Inflammatory arthropathy   Hospital Problems: Principal Problem:   Inflammatory arthropathy Active Problems:   Insulin dependent type 2 diabetes mellitus, controlled (HCC)   History of DVT (deep vein thrombosis)   CKD (chronic kidney disease) stage 4, GFR 15-29 ml/min (HCC)   Chronic diastolic heart failure (HCC)   Dyslipidemia   Urinary retention   Diabetic neuropathy (HCC)    DISCHARGE MEDICATIONS:   Allergies as of 02/26/2023       Reactions   Reglan [metoclopramide] Other (See Comments)   Dysphoric reaction   Metformin And Related Diarrhea   Ozempic (0.25 Or 0.5 Mg-dose) [semaglutide(0.25 Or 0.5mg -dos)] Nausea And Vomiting   Thiazide-type Diuretics    Gout        Medication List     PAUSE taking these medications    potassium chloride SA 20 MEQ tablet Wait to take this until your doctor or other care provider tells you to start again. Commonly known as: KLOR-CON M Take 20 mEq by mouth 3 (three) times daily.       STOP taking these medications    chlorthalidone 25 MG tablet Commonly known as: HYGROTON   predniSONE 5 MG (21) Tbpk tablet Commonly known as: STERAPRED UNI-PAK 21 TAB Replaced by: predniSONE 20 MG tablet       TAKE these medications    Accu-Chek Guide Me w/Device Kit Use to check blood sugar up to 3 times a day   Accu-Chek Guide test strip Generic drug: glucose blood Check blood sugar up to 3 times a day   accu-chek softclix lancets Use to check blood sugars 5 times a day. Dx code:E11.9. Insulin dependent. What changed:  how much to take how to take this when to take this   acetaminophen 325 MG tablet Commonly known as: TYLENOL Take 2 tablets (650 mg total) by mouth every 6 (six) hours.    amLODipine 10 MG tablet Commonly known as: NORVASC Take 10 mg by mouth at bedtime.   B-D UF III MINI PEN NEEDLES 31G X 5 MM Misc Generic drug: Insulin Pen Needle 1 each by Other route See admin instructions. USE 4-5 TIMES DAILY   B-D UF III MINI PEN NEEDLES 31G X 5 MM Misc Generic drug: Insulin Pen Needle USE 1 NEEDLE FIVE TIMES DAILY   carvedilol 25 MG tablet Commonly known as: COREG TAKE 1 TABLET BY MOUTH TWICE DAILY WITH A MEAL   cloNIDine 0.1 mg/24hr patch Commonly known as: CATAPRES - Dosed in mg/24 hr 0.1 mg once a week.   diclofenac Sodium 1 % Gel Commonly known as: Voltaren Apply 2 g topically 4 (four) times daily.   Eliquis 5 MG Tabs tablet Generic drug: apixaban Take 1 tablet by mouth twice daily   fluticasone 50 MCG/ACT nasal spray Commonly known as: Flonase Place 2 sprays into both nostrils daily. What changed:  when to take this reasons to take this   furosemide 80 MG tablet Commonly known as: LASIX Take 1 tablet by mouth twice daily What changed: when to take this   gabapentin 100 MG capsule Commonly known as: NEURONTIN TAKE 1 CAPSULE BY MOUTH THREE TIMES DAILY What changed: when to take this   Gvoke HypoPen 1-Pack 1 MG/0.2ML Soaj Generic drug: Glucagon Inject 1 mg into the skin as  needed (low blood sugar with impaired consciousness).   hydrALAZINE 100 MG tablet Commonly known as: APRESOLINE Take 100 mg by mouth 3 (three) times daily.   insulin aspart 100 UNIT/ML FlexTouch Pen Commonly known as: FIASP Inject 10 Units into the skin with breakfast, with lunch, and with evening meal.   lidocaine 4 % Commonly known as: HM Lidocaine Patch Place 1 patch onto the skin daily.   lovastatin 20 MG tablet Commonly known as: MEVACOR Take 1 tablet by mouth once daily   NovoLOG FlexPen 100 UNIT/ML FlexPen Generic drug: insulin aspart Inject 0-4 Units into the skin 3 (three) times daily with meals. Administer 0 units in the morning with breakfast,  0-4 units with lunch, and 0-4 units with dinner daily, maximum 30 units/day   oxybutynin 5 MG tablet Commonly known as: DITROPAN TAKE 1 TABLET BY MOUTH THREE TIMES DAILY   pantoprazole 20 MG tablet Commonly known as: PROTONIX Take 1 tablet (20 mg total) by mouth daily. Start taking on: February 27, 2023   polyethylene glycol 17 g packet Commonly known as: MIRALAX / GLYCOLAX Take 17 g by mouth 2 (two) times daily. What changed:  when to take this reasons to take this   predniSONE 20 MG tablet Commonly known as: DELTASONE Take 3 tablets (60 mg total) by mouth daily with breakfast for 8 days, THEN 2.5 tablets (50 mg total) daily with breakfast for 3 days, THEN 2 tablets (40 mg total) daily with breakfast for 3 days, THEN 1.5 tablets (30 mg total) daily with breakfast for 3 days, THEN 1 tablet (20 mg total) daily with breakfast for 3 days, THEN 0.5 tablets (10 mg total) daily with breakfast for 3 days. Start taking on: February 26, 2023 Replaces: predniSONE 5 MG (21) Tbpk tablet   senna-docusate 8.6-50 MG tablet Commonly known as: Senokot-S Take 1 tablet by mouth daily. What changed:  when to take this reasons to take this   tamsulosin 0.4 MG Caps capsule Commonly known as: FLOMAX Take 1 capsule by mouth at bedtime   tirzepatide 15 MG/0.5ML Pen Commonly known as: MOUNJARO Inject 15 mg into the skin once a week.   traMADol 50 MG tablet Commonly known as: ULTRAM Take 1 tablet (50 mg total) by mouth every 12 (twelve) hours as needed. What changed: reasons to take this   Tresiba FlexTouch 200 UNIT/ML FlexTouch Pen Generic drug: insulin degludec Inject 54 Units into the skin daily. INJECT 34 UNITS SUBCUTANEOUSLY ONCE DAILY What changed:  how much to take how to take this when to take this        DISPOSITION AND FOLLOW-UP:  Mr.Majestic Biehl was discharged from [facility] in Stable condition. At the hospital follow up visit please address:  Follow-up  Recommendations: Consultants: Internal Medicine, Endocrinology, Orthopedic Surgery, Nephrology Labs: CBC, BMP  Medications: Tresiba 54 units, Aspart 10 units with meals, Protonix 20 mg, Prednisone taper 60 mg for 10 days then decrease by 10 mg every three days -D/c Chlorthalidone, decreased Lasix to 80mg  daily from 80mg  BID    Follow-up Appointments: Patient needs to follow up with nephology and endocrinology   Follow-up Information     Gust Rung, DO. Go to.   Specialty: Internal Medicine Why: Please attend your hospital follow up appointment on 2/24 at 10:45am Contact information: [address] [place] [address] [phone]                 HOSPITAL COURSE:  Patient Summary: Inflammatory  arthropathy 2/2 gout Tenosynovitis  Treated with high-dose prednisone. Synovial aspiration with no growth at time of discharge and clinically low suspicion of infectious etiology. Imaging showed radiocarpal joint effusion with synovitis, small midcarpal joint effusion, diffuse intramuscular and subcutaneous edema; medial palmar subluxation of the extensor carpi ulnaris tendon of the sixth extensor compartment with injury of the ECU subsheath and tenosynovitis; tenosynovitis of the second and third extensor compartment tendons, tenosynovitis of the fourth extensor compartment tendons; and tendinosis of the abductor pollicis longus tendon of the first extensor compartment. This combined with uric acid level of 14.8 was concerning for acute gout flare, likely in setting of recent chlorthalidone initiation. Pain and mobility of affected joints (R wrist, elbow, shoulder) improved with prednisone dosing and he was able to resume transfers with his wheelchair and personal care needs. He remained afebrile without leukocytosis at time of discharge.   Insulin-dependent T2DM Diabetic neuropathy Difficulty managing blood glucose levels in the setting of high dose prednisone therapy for acute  gout flare. Insulin doses increased as indicated each day. Final insulin regimen recommendations discussed with patient's endocrinologist, who will follow closely following discharge for further adjustments as indicated. He has a [employer] that he will place on arrival home.   History DVT/PE Home eliquis continued.   CKD stage 4 Renal function stable throughout admission.   Chronic Diastolic heart failure Home furosemide held on admission and restarted at gradually increasing doses. He is clinically euvolemic at time of discharge.   Overactive Bladder Continued home oxybutynin.   HTN Continued home amlodipine, carvedilol, and hydralazine. Chlorthalidone was discontinued in the setting of acute gout flare and not restarted.    Chronic Pain Managed with voltaren gel, tylenol.    Constipation Managed with as-needed miralax.   HLD Continued home pravastatin.   BPH Continued home flomax.   DISCHARGE INSTRUCTIONS:   Discharge Instructions     Call MD for:  difficulty breathing, headache or visual disturbances   Complete by: As directed    Call MD for:  extreme fatigue   Complete by: As directed    Call MD for:  hives   Complete by: As directed    Call MD for:  persistant dizziness or light-headedness   Complete by: As directed    Call MD for:  persistant nausea and vomiting   Complete by: As directed    Call MD for:  redness, tenderness, or signs of infection (pain, swelling, redness, odor or green/yellow discharge around incision site)   Complete by: As directed    Call MD for:  severe uncontrolled pain   Complete by: As directed    Call MD for:  temperature >100.4   Complete by: As directed    Diet - low sodium heart healthy   Complete by: As directed    Discharge instructions   Complete by: As directed    You came to the hospital for wrist pain and you were diagnosed with gout.  We treated you with a steroid called prednisone.    *For your Gout -We have  started you on these following medications:  -Prednisone You will take 60mg  for 10 days, your last dose will be on 2/24, you will then take 50mg  of prednisone for 3 days, then 40mg  for 3 days, then 30mg  for 3 days, then 20mg  for 3 days, and 10mg  for 3 days             -Protonix, take this medication every morning 30-60 min prior to breakfast,  this helps protect your stomach form the prednisone   -We have stopped the chlorthalidone -We will adjust the Lasix dose to 80mg  daily    -It is very important to see your PCP office within the next 7-10 days  *For your Diabetes  -We have started you on these following medications while you are taking the prednisone:  -Tresiba 54 units every morning  -Please continue your sliding scale insulin with meals  -Aspart (short acting insulin) 10 units with every meal   -Please follow up with your endocrinologist             -If your fasting blood glucose in the morning remains elevated (greater than 300mg  every morning) please give the clinic a call and your endocrinologist office for advice on changing your insulin regimen.  If your fasting blood glucose level or any blood glucose reading throughout the day is below 70, please call the either our clinic or the endocrinologist office  Follow-up appointments: PCP: 2/24 at 10:45am   If you have any questions or concerns please feel free to call: Internal medicine clinic at [phone]   If you have any of these following symptoms, please call us or seek care at an emergency department: -Chest Pain -Difficulty Breathing -Syncope (passing out) -Drooping of face -Slurred speech -Sudden weakness in your leg or arm -Fever -Chills   We are glad that you are feeling better, it was a pleasure to care for you!  Faith Rogue DO   Increase activity slowly   Complete by: As directed        SUBJECTIVE:  Patient reported feeling better today, his R shoulder, R elbow, and R wrist pain are all improving.  He continues to have hiccups. We discussed the plan for his insulin regimen change while on prednisone, patient agreed and understood the plan. He understands to call either clinic or endocrinology if he remains persistently high or if he experiences hypoglycemia. He shared that he is going to [place] in March and is hopeful he will be off the prednisone taper by then.   Discharge Vitals:   BP (!) 153/81 (BP Location: Left Arm)   Pulse 71   Temp 97.7 F (36.5 C) (Oral)   Resp 18   Ht 6\' 2"  (1.88 m)   Wt (!) 140.2 kg   SpO2 98%   BMI 39.67 kg/m   OBJECTIVE:  Physical Exam  General:NAD,  Cardiac:RRR, no murmur appreciated  Pulmonary:normal effort on room air  Neuro:awake, alert, participating in conversation  MSK: Elbow ROM intact, R wrist edema pain and warmth have all improved, minimal pain to palpate R wrist Skin:warm and dry Psych:  Normal mood and affect  Pertinent Labs, Studies, and Procedures:     Latest Ref Rng & Units 02/26/2023    4:30 AM 02/25/2023    3:27 AM 02/24/2023    4:42 AM  CBC  WBC 4.0 - 10.5 K/uL 8.4  8.8  8.7   Hemoglobin 13.0 - 17.0 g/dL 9.1  9.5  9.3   Hematocrit 39.0 - 52.0 % 27.5  29.0  28.8   Platelets 150 - 400 K/uL 265  273  277        Latest Ref Rng & Units 02/26/2023    4:30 AM 02/25/2023    3:27 AM 02/24/2023    4:42 AM  CMP  Glucose 70 - 99 mg/dL 098  119  147   BUN 6 - 20 mg/dL 86  89  93   Creatinine  0.61 - 1.24 mg/dL 1.61  0.96  0.45   Sodium 135 - 145 mmol/L 131  134  131   Potassium 3.5 - 5.1 mmol/L 4.4  4.1  4.2   Chloride 98 - 111 mmol/L 102  100  100   CO2 22 - 32 mmol/L 20  19  21    Calcium 8.9 - 10.3 mg/dL 8.7  9.2  8.7     MR WRIST RIGHT WO CONTRAST Result Date: 02/23/2023 CLINICAL DATA:  48 year old male presenting ongoing pain and swelling of the right wrist. EXAM: MR OF THE RIGHT WRIST WITHOUT CONTRAST TECHNIQUE: Multiplanar, multisequence MR imaging of the right wrist was performed. No intravenous contrast was administered.  COMPARISON:  Right wrist radiographs dated 02/22/2023. FINDINGS: Ligaments: Extrinsic ligaments are intact. Scapholunate and lunotriquetral ligaments are intact. Triangular fibrocartilage: Intact Tendons: Thickening and intermediate signal of the first extensor compartment abductor pollicis longus tendon. Tenosynovitis of the extensor pollicis longus tendon of the third extensor compartment as it crosses over the second extensor compartment, proximal to Lister's tubercle, with tenosynovitis of the extensor carpi radialis longus and extensor carpi radialis brevis tendons of the second extensor compartment. Tenosynovitis of the extensor digitorum and indicis tendons of the fourth extensor compartment. There is medial palmar subluxation of the extensor carpi ulnaris tendon with ill-defined appearance and edema of the ECU subsheath. Flexor compartment tendons are intact without tendinosis, tear or tenosynovitis. Carpal tunnel/median nerve: Flexor retinaculum is intact. Normal carpal tunnel without a mass. Median nerve demonstrates normal signal and caliber. Guyon's canal: Normal Guyon's canal. Normal ulnar nerve. Joint/cartilage: There is a radiocarpal joint effusion with synovial thickening, suggestive of synovitis. Small midcarpal joint effusion. No appreciable chondral defect. Bones/carpal alignment: No fracture, avascular necrosis, or osseous lesion. Mild edema at the dorsal aspect of the scaphoid. Normal alignment. Other: Diffuse mild-to-moderate intramuscular edema. Diffuse subcutaneous edema, most pronounced at the dorsal wrist and hand. No loculated fluid collection or discrete mass. IMPRESSION: 1. Radiocarpal joint effusion with synovitis. Small midcarpal joint effusion. Diffuse intramuscular and subcutaneous edema, most pronounced dorsally. These findings along with the extensor compartment tendon abnormalities, described below, are favored secondary to an inflammatory arthropathy, however, septic arthritis  can not be entirely excluded. 2. Medial palmar subluxation of the extensor carpi ulnaris tendon of the sixth extensor compartment with injury of the ECU subsheath and tenosynovitis. 3. Tenosynovitis of the second and third extensor compartment tendons, which can be seen in the setting of distal intersection syndrome. Tenosynovitis of the fourth extensor compartment tendons. 4. Tendinosis of the abductor pollicis longus tendon of the first extensor compartment. Electronically Signed   By: Hart Robinsons M.D.   On: 02/23/2023 09:32   DG Wrist Complete Right Result Date: 02/22/2023 CLINICAL DATA:  Pain and edema. EXAM: RIGHT WRIST - COMPLETE 3 VIEW COMPARISON:  02/06/2023. FINDINGS: There is no evidence of fracture or dislocation. There is no evidence of arthropathy or other focal bone abnormality. Posterior wrist soft tissue swelling. IMPRESSION: Soft tissue swelling. No acute osseous abnormalities. Electronically Signed   By: Layla Maw M.D.   On: 02/22/2023 19:18   UE VENOUS DUPLEX (7am - 7pm) Result Date: 02/22/2023 UPPER VENOUS STUDY  Patient Name:  ANGELGABRIEL WILLMORE  Date of Exam:   02/22/2023 Medical Rec #: [id number]      Accession #:    [id number] Date of Birth: 09/27/1975      Patient Gender: M Patient Age:   43 years Exam Location:  [facility] Procedure:  VAS [place] UPPER EXTREMITY VENOUS DUPLEX Referring Phys: Marchelle Folks FRANASZEK --------------------------------------------------------------------------------  Risk Factors: None identified. Limitations: Poor ultrasound/tissue interface and patient positioning, patient pain tolerance. Comparison Study: No prior studies. Performing Technologist: Chanda Busing RVT  Examination Guidelines: A complete evaluation includes B-mode imaging, spectral Doppler, color Doppler, and power Doppler as needed of all accessible portions of each vessel. Bilateral testing is considered an integral part of a complete examination. Limited examinations for  reoccurring indications may be performed as noted.  Right Findings: +----------+------------+---------+-----------+----------+-------+ RIGHT     CompressiblePhasicitySpontaneousPropertiesSummary +----------+------------+---------+-----------+----------+-------+ IJV           Full       Yes       Yes                      +----------+------------+---------+-----------+----------+-------+ Subclavian    Full       Yes       Yes                      +----------+------------+---------+-----------+----------+-------+ Axillary      Full       Yes       Yes                      +----------+------------+---------+-----------+----------+-------+ Brachial      Full                                          +----------+------------+---------+-----------+----------+-------+ Radial        Full                                          +----------+------------+---------+-----------+----------+-------+ Ulnar         Full                                          +----------+------------+---------+-----------+----------+-------+ Cephalic      Full                                          +----------+------------+---------+-----------+----------+-------+ Basilic       Full                                          +----------+------------+---------+-----------+----------+-------+  Left Findings: +----------+------------+---------+-----------+----------+-------+ LEFT      CompressiblePhasicitySpontaneousPropertiesSummary +----------+------------+---------+-----------+----------+-------+ Subclavian    Full       Yes       Yes                      +----------+------------+---------+-----------+----------+-------+  Summary:  Right: No evidence of deep vein thrombosis in the upper extremity. No evidence of superficial vein thrombosis in the upper extremity.  Left: No evidence of thrombosis in the subclavian.  *See table(s) above for measurements and observations.     Preliminary      Signed: Faith Rogue, D.O.  Internal Medicine Resident, PGY-1 [facility]

## 2023-02-28 ENCOUNTER — Encounter (HOSPITAL_COMMUNITY): Payer: Medicare HMO

## 2023-03-01 ENCOUNTER — Ambulatory Visit (HOSPITAL_COMMUNITY)
Admission: RE | Admit: 2023-03-01 | Discharge: 2023-03-01 | Disposition: A | Discharge status: Home / Self Care | Payer: Medicare HMO | Source: Ambulatory Visit | Attending: Nephrology | Admitting: Nephrology

## 2023-03-01 VITALS — BP 163/82 | HR 92 | Temp 97.3°F | Resp 17

## 2023-03-01 DIAGNOSIS — N184 Chronic kidney disease, stage 4 (severe): Secondary | ICD-10-CM | POA: Insufficient documentation

## 2023-03-01 DIAGNOSIS — E119 Type 2 diabetes mellitus without complications: Secondary | ICD-10-CM | POA: Diagnosis not present

## 2023-03-01 LAB — RENAL FUNCTION PANEL
Albumin: 2.7 g/dL — ABNORMAL LOW (ref 3.5–5.0)
Anion gap: 10 (ref 5–15)
BUN: 76 mg/dL — ABNORMAL HIGH (ref 6–20)
CO2: 20 mmol/L — ABNORMAL LOW (ref 22–32)
Calcium: 8.4 mg/dL — ABNORMAL LOW (ref 8.9–10.3)
Chloride: 106 mmol/L (ref 98–111)
Creatinine, Ser: 2.73 mg/dL — ABNORMAL HIGH (ref 0.61–1.24)
GFR, Estimated: 28 mL/min — ABNORMAL LOW (ref >60)
Glucose, Bld: 280 mg/dL — ABNORMAL HIGH (ref 70–99)
Phosphorus: 2.7 mg/dL (ref 2.5–4.6)
Potassium: 4.2 mmol/L (ref 3.5–5.1)
Sodium: 136 mmol/L (ref 135–145)

## 2023-03-01 LAB — POCT HEMOGLOBIN-HEMACUE: Hemoglobin: 9.6 g/dL — ABNORMAL LOW (ref 13.0–17.0)

## 2023-03-01 LAB — IRON AND TIBC
Iron: 82 ug/dL (ref 45–182)
Saturation Ratios: 37 % (ref 17.9–39.5)
TIBC: 221 ug/dL — ABNORMAL LOW (ref 250–450)
UIBC: 139 ug/dL

## 2023-03-01 LAB — FERRITIN: Ferritin: 586 ng/mL — ABNORMAL HIGH (ref 24–336)

## 2023-03-01 MED ORDER — DARBEPOETIN ALFA 200 MCG/0.4ML IJ SOSY: 200.0000 ug | PREFILLED_SYRINGE | INTRAMUSCULAR | Status: DC

## 2023-03-01 MED ORDER — DARBEPOETIN ALFA 200 MCG/0.4ML IJ SOSY
PREFILLED_SYRINGE | INTRAMUSCULAR | Status: AC
  Administered 2023-03-01: 200 ug via SUBCUTANEOUS
  Filled 2023-03-01: qty 0.4

## 2023-03-02 ENCOUNTER — Other Ambulatory Visit: Payer: Medicare HMO

## 2023-03-05 ENCOUNTER — Encounter: Payer: Medicare HMO | Admitting: Student

## 2023-03-05 ENCOUNTER — Telehealth: Payer: Self-pay

## 2023-03-05 NOTE — Telephone Encounter (Signed)
 Requesting a letter for school to covered him from 2/13-2/17/2025.

## 2023-03-05 NOTE — Telephone Encounter (Signed)
 RTC from patient needs to have letter sent to Professor Fayrene Fearing.  Letter requested needs to go to a different Professor.  Asked patient if the letter could go to [employer] It [employer].  Patient stated that that would be ok.

## 2023-03-05 NOTE — Progress Notes (Deleted)
***       CC: Hospital Follow Up after admission from 02/22/2023 to 02/26/2023 for acute gout flare of the right wrist  HPI:  [name] is a 48 y.o. male with pertinent PMH of HFpEF, HTN, OSA, T2DM with neuropathy, gout, CKD stage IV, class II/III obesity, and HLD who presents as above. Please see assessment and plan below for further details.  Review of Systems:   Pertinent items noted in HPI and/or A&P.  Physical Exam:  There were no vitals filed for this visit.  Constitutional:***. In no acute distress. HEENT: Normocephalic, atraumatic, Sclera non-icteric, PERRL, EOM intact Cardio:Regular rate and rhythm. 2+ bilateral {PulseLoc:28294} pulses. Pulm:Clear to auscultation bilaterally. Normal work of breathing on room air. Abdomen: Soft, non-tender, non-distended, positive bowel sounds. ZOX:WRUEAVWU for extremity edema. Skin:Warm and dry. Neuro:Alert and oriented x3. No focal deficit noted. Psych:Pleasant mood and affect.   Assessment & Plan:   No problem-specific Assessment & Plan notes found for this encounter.    Patient {GC/GE:3044014::"discussed with","seen with"} Dr. {NAMES:[id number]::"Lau","Guilloud","Hoffman","Machen","Chambliss","Winfrey","Williams","Vincent"}  Rocky Morel, DO [facility] Internal Medicine Resident PGY-2 Clinic Phone: [phone] Pager: [phone]

## 2023-03-05 NOTE — Telephone Encounter (Signed)
 RTC to patient would like to have letter sent Attention Professor Manson Passey.  Would like to have letter sent through MyChart if possible.

## 2023-03-08 ENCOUNTER — Ambulatory Visit: Payer: Medicare HMO | Admitting: Student

## 2023-03-08 VITALS — BP 179/77 | HR 98

## 2023-03-08 DIAGNOSIS — E119 Type 2 diabetes mellitus without complications: Secondary | ICD-10-CM | POA: Diagnosis not present

## 2023-03-08 DIAGNOSIS — I5032 Chronic diastolic (congestive) heart failure: Secondary | ICD-10-CM | POA: Diagnosis not present

## 2023-03-08 DIAGNOSIS — M109 Gout, unspecified: Secondary | ICD-10-CM

## 2023-03-08 DIAGNOSIS — Z794 Long term (current) use of insulin: Secondary | ICD-10-CM | POA: Diagnosis not present

## 2023-03-08 NOTE — Progress Notes (Signed)
   CC: Hospital Follow Up after admission from 02/22/2023 to 02/26/2023 for acute gout flare of the right wrist  HPI:  [name] is a 48 y.o. male with pertinent PMH of HFpEF, HTN, OSA, T2DM with neuropathy, gout, CKD stage IV, class II/III obesity, and HLD who presents as above. Please see assessment and plan below for further details.  Review of Systems:   Pertinent items noted in HPI and/or A&P.  Physical Exam:  Vitals:   03/08/23 1457  BP: (!) 179/77  Pulse: 98    Constitutional: Well-appearing adult male. In no acute distress. HEENT: Normocephalic, atraumatic, Sclera non-icteric, PERRL, EOM intact Cardio:Regular rate and rhythm. 2+ bilateral radial pulses. Pulm:Clear to auscultation bilaterally. Normal work of breathing on room air. Abdomen: Soft, non-tender, non-distended, positive bowel sounds. MSK: 1+ pitting edema on the left lower extremity.  Stable right AKA Skin:Warm and dry. Neuro:Alert and oriented x3. No focal deficit noted. Psych:Pleasant mood and affect.   Assessment & Plan:   Chronic diastolic heart failure (HCC) On discharge from the floor patient was told to take Lasix 80 mg daily down from 80 mg twice daily and understood at this to take 40 mg twice daily.  He has noticed increased lower extremity edema without dyspnea or other signs of decompensated heart failure.  I recommended that he start taking Lasix 80 mg daily and he would likely see a benefit as well as as steroids continue to taper he may see less fluid accumulation.  Will reevaluate in 4 weeks. - Continue carvedilol 25 mg twice daily, Lasix 80 mg daily, hydralazine 100 mg 3 times daily,  Insulin dependent type 2 diabetes mellitus, controlled (HCC) Due to starting steroids for acute gout flare his insulin regimen was changed on discharge from the hospital.  He was increased from 35 units of Tresiba to 54 units of Tresiba and 10 units NovoLog with meals.  I reviewed his CGM data which does show a  spike in his average blood sugar into the 200s however since we are shortening his steroid taper we would not make any changes today.  He is given strict return precautions for high and low blood sugars. - Continue Tresiba 54 units daily and 10 units NovoLog with meals, and tirzepatide 15 mg weekly  Acute gout of right wrist Patient presents for hospital follow-up after being discharged 02/26/2023 for acute gout flare of right wrist.  He is currently on a steroid taper but is having significant side effects from the steroids that have impacted his mood.  We discussed speeding up the taper and he is agreeable to this.  He is either on day 1 or 2 above 50 mg of prednisone daily.  Fortunately his symptoms have resolved and not returned. - Change steroid taper from 3-day decreases to 2-day decreases    Patient discussed with Dr. Alfonzo Beers, DO [facility] Internal [facility] Resident PGY-2 Clinic Phone: [phone] Pager: [phone]

## 2023-03-08 NOTE — Patient Instructions (Signed)
  Thank you, [name], for allowing [place] to provide your care today. Today we discussed . . .  > Gout       -We are going to change your steroid taper from a decrease every 3 days to every 2 days.  If you have taken 2 days of the 50 mg you can go ahead and decrease to 40 mg daily for 2 days and then 30 mg daily for 2 days and so on until you get to 0.  I would like you to watch your glucose closely over the next 2 weeks especially with the increased insulin.  If your morning sugar is below 100 please call our office or the endocrinologist to get advice about decreasing the dose of your insulins.  Before then please continue with your current doses of Tresiba 54 units daily and NovoLog 10 units 3 times daily with meals.  As the steroid dose goes down your blood sugar should get under better control again. > Fluid       -Please start taking Lasix 80 mg once daily and if you do not see any improvement in your swelling by the middle of next week please call us and we can talk about increasing back to 80 mg twice daily.  Also if you do not get labs before your next infusion let us know and we will have you come here to check your electrolytes and kidney function.   Follow up:  4 weeks     Remember:     Should you have any questions or concerns please call the internal medicine clinic at [phone].     Rocky Morel, DO [facility]

## 2023-03-09 ENCOUNTER — Ambulatory Visit: Payer: Medicare HMO | Admitting: Orthopaedic Surgery

## 2023-03-12 ENCOUNTER — Encounter: Payer: Self-pay | Admitting: Student

## 2023-03-12 NOTE — Assessment & Plan Note (Signed)
 Patient presents for hospital follow-up after being discharged 02/26/2023 for acute gout flare of right wrist.  He is currently on a steroid taper but is having significant side effects from the steroids that have impacted his mood.  We discussed speeding up the taper and he is agreeable to this.  He is either on day 1 or 2 above 50 mg of prednisone daily.  Fortunately his symptoms have resolved and not returned. - Change steroid taper from 3-day decreases to 2-day decreases

## 2023-03-12 NOTE — Assessment & Plan Note (Signed)
 On discharge from the floor patient was told to take Lasix 80 mg daily down from 80 mg twice daily and understood at this to take 40 mg twice daily.  He has noticed increased lower extremity edema without dyspnea or other signs of decompensated heart failure.  I recommended that he start taking Lasix 80 mg daily and he would likely see a benefit as well as as steroids continue to taper he may see less fluid accumulation.  Will reevaluate in 4 weeks. - Continue carvedilol 25 mg twice daily, Lasix 80 mg daily, hydralazine 100 mg 3 times daily,

## 2023-03-12 NOTE — Assessment & Plan Note (Signed)
 Due to starting steroids for acute gout flare his insulin regimen was changed on discharge from the hospital.  He was increased from 35 units of Tresiba to 54 units of Tresiba and 10 units NovoLog with meals.  I reviewed his CGM data which does show a spike in his average blood sugar into the 200s however since we are shortening his steroid taper we would not make any changes today.  He is given strict return precautions for high and low blood sugars. - Continue Tresiba 54 units daily and 10 units NovoLog with meals, and tirzepatide 15 mg weekly

## 2023-03-13 ENCOUNTER — Emergency Department (HOSPITAL_COMMUNITY)

## 2023-03-13 ENCOUNTER — Encounter (HOSPITAL_COMMUNITY): Payer: Self-pay

## 2023-03-13 ENCOUNTER — Inpatient Hospital Stay (HOSPITAL_COMMUNITY)
Admission: EM | Admit: 2023-03-13 | Discharge: 2023-03-16 | DRG: 291 | Disposition: A | Discharge status: Home / Self Care | Attending: Internal Medicine | Admitting: Internal Medicine

## 2023-03-13 ENCOUNTER — Ambulatory Visit (INDEPENDENT_AMBULATORY_CARE_PROVIDER_SITE_OTHER): Admitting: Orthopedic Surgery

## 2023-03-13 ENCOUNTER — Other Ambulatory Visit: Payer: Self-pay

## 2023-03-13 ENCOUNTER — Telehealth: Payer: Self-pay | Admitting: *Deleted

## 2023-03-13 VITALS — BP 153/85 | HR 105

## 2023-03-13 DIAGNOSIS — N184 Chronic kidney disease, stage 4 (severe): Secondary | ICD-10-CM | POA: Diagnosis not present

## 2023-03-13 DIAGNOSIS — Z7985 Long-term (current) use of injectable non-insulin antidiabetic drugs: Secondary | ICD-10-CM

## 2023-03-13 DIAGNOSIS — K219 Gastro-esophageal reflux disease without esophagitis: Secondary | ICD-10-CM | POA: Diagnosis present

## 2023-03-13 DIAGNOSIS — N401 Enlarged prostate with lower urinary tract symptoms: Secondary | ICD-10-CM | POA: Diagnosis present

## 2023-03-13 DIAGNOSIS — N3281 Overactive bladder: Secondary | ICD-10-CM | POA: Diagnosis present

## 2023-03-13 DIAGNOSIS — Z86711 Personal history of pulmonary embolism: Secondary | ICD-10-CM | POA: Diagnosis present

## 2023-03-13 DIAGNOSIS — G4733 Obstructive sleep apnea (adult) (pediatric): Secondary | ICD-10-CM | POA: Diagnosis present

## 2023-03-13 DIAGNOSIS — E1140 Type 2 diabetes mellitus with diabetic neuropathy, unspecified: Secondary | ICD-10-CM | POA: Diagnosis not present

## 2023-03-13 DIAGNOSIS — I10 Essential (primary) hypertension: Secondary | ICD-10-CM | POA: Diagnosis not present

## 2023-03-13 DIAGNOSIS — D72829 Elevated white blood cell count, unspecified: Secondary | ICD-10-CM | POA: Diagnosis not present

## 2023-03-13 DIAGNOSIS — E16A2 Hypoglycemia level 2: Secondary | ICD-10-CM | POA: Diagnosis not present

## 2023-03-13 DIAGNOSIS — E11649 Type 2 diabetes mellitus with hypoglycemia without coma: Secondary | ICD-10-CM | POA: Diagnosis not present

## 2023-03-13 DIAGNOSIS — R0609 Other forms of dyspnea: Secondary | ICD-10-CM | POA: Diagnosis not present

## 2023-03-13 DIAGNOSIS — M7989 Other specified soft tissue disorders: Secondary | ICD-10-CM

## 2023-03-13 DIAGNOSIS — R000 Tachycardia, unspecified: Secondary | ICD-10-CM | POA: Diagnosis not present

## 2023-03-13 DIAGNOSIS — J9601 Acute respiratory failure with hypoxia: Secondary | ICD-10-CM | POA: Diagnosis not present

## 2023-03-13 DIAGNOSIS — R0902 Hypoxemia: Principal | ICD-10-CM

## 2023-03-13 DIAGNOSIS — Z6841 Body Mass Index (BMI) 40.0 and over, adult: Secondary | ICD-10-CM | POA: Diagnosis not present

## 2023-03-13 DIAGNOSIS — R0602 Shortness of breath: Secondary | ICD-10-CM | POA: Diagnosis not present

## 2023-03-13 DIAGNOSIS — J9621 Acute and chronic respiratory failure with hypoxia: Secondary | ICD-10-CM | POA: Diagnosis present

## 2023-03-13 DIAGNOSIS — Z89611 Acquired absence of right leg above knee: Secondary | ICD-10-CM | POA: Diagnosis not present

## 2023-03-13 DIAGNOSIS — R609 Edema, unspecified: Secondary | ICD-10-CM | POA: Diagnosis not present

## 2023-03-13 DIAGNOSIS — E119 Type 2 diabetes mellitus without complications: Secondary | ICD-10-CM

## 2023-03-13 DIAGNOSIS — I5041 Acute combined systolic (congestive) and diastolic (congestive) heart failure: Secondary | ICD-10-CM | POA: Diagnosis not present

## 2023-03-13 DIAGNOSIS — Z888 Allergy status to other drugs, medicaments and biological substances status: Secondary | ICD-10-CM

## 2023-03-13 DIAGNOSIS — K5900 Constipation, unspecified: Secondary | ICD-10-CM | POA: Diagnosis present

## 2023-03-13 DIAGNOSIS — E785 Hyperlipidemia, unspecified: Secondary | ICD-10-CM | POA: Diagnosis present

## 2023-03-13 DIAGNOSIS — E1122 Type 2 diabetes mellitus with diabetic chronic kidney disease: Secondary | ICD-10-CM | POA: Diagnosis present

## 2023-03-13 DIAGNOSIS — I5033 Acute on chronic diastolic (congestive) heart failure: Secondary | ICD-10-CM | POA: Diagnosis present

## 2023-03-13 DIAGNOSIS — I5032 Chronic diastolic (congestive) heart failure: Secondary | ICD-10-CM

## 2023-03-13 DIAGNOSIS — Z794 Long term (current) use of insulin: Secondary | ICD-10-CM

## 2023-03-13 DIAGNOSIS — Z79899 Other long term (current) drug therapy: Secondary | ICD-10-CM

## 2023-03-13 DIAGNOSIS — Z7901 Long term (current) use of anticoagulants: Secondary | ICD-10-CM

## 2023-03-13 DIAGNOSIS — I130 Hypertensive heart and chronic kidney disease with heart failure and stage 1 through stage 4 chronic kidney disease, or unspecified chronic kidney disease: Principal | ICD-10-CM | POA: Diagnosis present

## 2023-03-13 DIAGNOSIS — J811 Chronic pulmonary edema: Secondary | ICD-10-CM | POA: Diagnosis not present

## 2023-03-13 DIAGNOSIS — S78111A Complete traumatic amputation at level between right hip and knee, initial encounter: Secondary | ICD-10-CM | POA: Diagnosis present

## 2023-03-13 DIAGNOSIS — Z86718 Personal history of other venous thrombosis and embolism: Secondary | ICD-10-CM | POA: Diagnosis not present

## 2023-03-13 DIAGNOSIS — M109 Gout, unspecified: Secondary | ICD-10-CM | POA: Diagnosis present

## 2023-03-13 DIAGNOSIS — R0989 Other specified symptoms and signs involving the circulatory and respiratory systems: Secondary | ICD-10-CM | POA: Diagnosis not present

## 2023-03-13 LAB — CBC WITH DIFFERENTIAL/PLATELET
Abs Immature Granulocytes: 0.07 10*3/uL (ref 0.00–0.07)
Basophils Absolute: 0 10*3/uL (ref 0.0–0.1)
Basophils Relative: 0 %
Eosinophils Absolute: 0 10*3/uL (ref 0.0–0.5)
Eosinophils Relative: 0 %
HCT: 31.1 % — ABNORMAL LOW (ref 39.0–52.0)
Hemoglobin: 9.5 g/dL — ABNORMAL LOW (ref 13.0–17.0)
Immature Granulocytes: 1 %
Lymphocytes Relative: 2 %
Lymphs Abs: 0.2 10*3/uL — ABNORMAL LOW (ref 0.7–4.0)
MCH: 29.2 pg (ref 26.0–34.0)
MCHC: 30.5 g/dL (ref 30.0–36.0)
MCV: 95.7 fL (ref 80.0–100.0)
Monocytes Absolute: 0.7 10*3/uL (ref 0.1–1.0)
Monocytes Relative: 5 %
Neutro Abs: 12.4 10*3/uL — ABNORMAL HIGH (ref 1.7–7.7)
Neutrophils Relative %: 92 %
Platelets: 149 10*3/uL — ABNORMAL LOW (ref 150–400)
RBC: 3.25 MIL/uL — ABNORMAL LOW (ref 4.22–5.81)
RDW: 18.9 % — ABNORMAL HIGH (ref 11.5–15.5)
WBC: 13.5 10*3/uL — ABNORMAL HIGH (ref 4.0–10.5)
nRBC: 0.1 % (ref 0.0–0.2)

## 2023-03-13 LAB — RESP PANEL BY RT-PCR (RSV, FLU A&B, COVID)  RVPGX2
Influenza A by PCR: NEGATIVE
Influenza B by PCR: NEGATIVE
Resp Syncytial Virus by PCR: NEGATIVE
SARS Coronavirus 2 by RT PCR: NEGATIVE

## 2023-03-13 LAB — I-STAT VENOUS BLOOD GAS, ED
Acid-base deficit: 1 mmol/L (ref 0.0–2.0)
Bicarbonate: 22.1 mmol/L (ref 20.0–28.0)
Calcium, Ion: 1.13 mmol/L — ABNORMAL LOW (ref 1.15–1.40)
HCT: 29 % — ABNORMAL LOW (ref 39.0–52.0)
Hemoglobin: 9.9 g/dL — ABNORMAL LOW (ref 13.0–17.0)
O2 Saturation: 94 %
Potassium: 4.2 mmol/L (ref 3.5–5.1)
Sodium: 141 mmol/L (ref 135–145)
TCO2: 23 mmol/L (ref 22–32)
pCO2, Ven: 31.6 mmHg — ABNORMAL LOW (ref 44–60)
pH, Ven: 7.453 — ABNORMAL HIGH (ref 7.25–7.43)
pO2, Ven: 66 mmHg — ABNORMAL HIGH (ref 32–45)

## 2023-03-13 LAB — BASIC METABOLIC PANEL
Anion gap: 11 (ref 5–15)
BUN: 75 mg/dL — ABNORMAL HIGH (ref 6–20)
CO2: 19 mmol/L — ABNORMAL LOW (ref 22–32)
Calcium: 8.3 mg/dL — ABNORMAL LOW (ref 8.9–10.3)
Chloride: 110 mmol/L (ref 98–111)
Creatinine, Ser: 2.71 mg/dL — ABNORMAL HIGH (ref 0.61–1.24)
GFR, Estimated: 28 mL/min — ABNORMAL LOW (ref >60)
Glucose, Bld: 96 mg/dL (ref 70–99)
Potassium: 3.9 mmol/L (ref 3.5–5.1)
Sodium: 140 mmol/L (ref 135–145)

## 2023-03-13 LAB — HEPATIC FUNCTION PANEL
ALT: 22 U/L (ref 0–44)
AST: 25 U/L (ref 15–41)
Albumin: 3 g/dL — ABNORMAL LOW (ref 3.5–5.0)
Alkaline Phosphatase: 44 U/L (ref 38–126)
Bilirubin, Direct: 0.2 mg/dL (ref 0.0–0.2)
Indirect Bilirubin: 0.8 mg/dL (ref 0.3–0.9)
Total Bilirubin: 1 mg/dL (ref 0.0–1.2)
Total Protein: 6.2 g/dL — ABNORMAL LOW (ref 6.5–8.1)

## 2023-03-13 LAB — CBG MONITORING, ED
Glucose-Capillary: 112 mg/dL — ABNORMAL HIGH (ref 70–99)
Glucose-Capillary: 101 mg/dL — ABNORMAL HIGH (ref 70–99)

## 2023-03-13 LAB — BRAIN NATRIURETIC PEPTIDE: B Natriuretic Peptide: 276.1 pg/mL — ABNORMAL HIGH (ref 0.0–100.0)

## 2023-03-13 MED ORDER — INSULIN ASPART 100 UNIT/ML IJ SOLN
0.0000 [IU] | Freq: Three times a day (TID) | INTRAMUSCULAR | Status: DC
  Administered 2023-03-14: 7 [IU] via SUBCUTANEOUS
  Administered 2023-03-14 – 2023-03-15 (×2): 3 [IU] via SUBCUTANEOUS
  Administered 2023-03-15: 7 [IU] via SUBCUTANEOUS
  Administered 2023-03-15: 3 [IU] via SUBCUTANEOUS

## 2023-03-13 MED ORDER — INSULIN ASPART 100 UNIT/ML IJ SOLN
0.0000 [IU] | Freq: Every day | INTRAMUSCULAR | Status: DC
  Administered 2023-03-15: 3 [IU] via SUBCUTANEOUS

## 2023-03-13 MED ORDER — POLYETHYLENE GLYCOL 3350 17 G PO PACK
17.0000 g | PACK | Freq: Every day | ORAL | Status: DC
  Filled 2023-03-13: qty 1

## 2023-03-13 MED ORDER — GABAPENTIN 100 MG PO CAPS
100.0000 mg | ORAL_CAPSULE | Freq: Two times a day (BID) | ORAL | Status: DC
  Administered 2023-03-14 – 2023-03-16 (×6): 100 mg via ORAL
  Filled 2023-03-13 (×6): qty 1

## 2023-03-13 MED ORDER — FUROSEMIDE 10 MG/ML IJ SOLN
80.0000 mg | Freq: Once | INTRAMUSCULAR | Status: AC
  Administered 2023-03-13: 80 mg via INTRAVENOUS
  Filled 2023-03-13: qty 8

## 2023-03-13 MED ORDER — ALBUTEROL SULFATE (2.5 MG/3ML) 0.083% IN NEBU: 2.5000 mg | INHALATION_SOLUTION | RESPIRATORY_TRACT | Status: DC | PRN

## 2023-03-13 MED ORDER — AMLODIPINE BESYLATE 10 MG PO TABS
10.0000 mg | ORAL_TABLET | Freq: Every day | ORAL | Status: DC
  Administered 2023-03-14 – 2023-03-15 (×3): 10 mg via ORAL
  Filled 2023-03-13 (×3): qty 1

## 2023-03-13 MED ORDER — ACETAMINOPHEN 325 MG PO TABS
650.0000 mg | ORAL_TABLET | Freq: Four times a day (QID) | ORAL | Status: DC
  Administered 2023-03-14 – 2023-03-16 (×11): 650 mg via ORAL
  Filled 2023-03-13 (×11): qty 2

## 2023-03-13 MED ORDER — PREDNISONE 20 MG PO TABS
20.0000 mg | ORAL_TABLET | Freq: Every day | ORAL | Status: AC
  Administered 2023-03-14: 20 mg via ORAL
  Filled 2023-03-13: qty 1

## 2023-03-13 MED ORDER — CARVEDILOL 25 MG PO TABS
25.0000 mg | ORAL_TABLET | Freq: Two times a day (BID) | ORAL | Status: DC
  Administered 2023-03-14 – 2023-03-16 (×5): 25 mg via ORAL
  Filled 2023-03-13 (×5): qty 1

## 2023-03-13 MED ORDER — OXYBUTYNIN CHLORIDE 5 MG PO TABS
5.0000 mg | ORAL_TABLET | Freq: Three times a day (TID) | ORAL | Status: DC
  Administered 2023-03-14 – 2023-03-16 (×8): 5 mg via ORAL
  Filled 2023-03-13 (×8): qty 1

## 2023-03-13 MED ORDER — PANTOPRAZOLE SODIUM 20 MG PO TBEC
20.0000 mg | DELAYED_RELEASE_TABLET | Freq: Every day | ORAL | Status: DC
  Administered 2023-03-14 – 2023-03-16 (×3): 20 mg via ORAL
  Filled 2023-03-13 (×3): qty 1

## 2023-03-13 MED ORDER — ACETAMINOPHEN 650 MG RE SUPP: 650.0000 mg | Freq: Four times a day (QID) | RECTAL | Status: DC | PRN

## 2023-03-13 MED ORDER — INSULIN ASPART 100 UNIT/ML IJ SOLN: 10.0000 [IU] | Freq: Three times a day (TID) | INTRAMUSCULAR | Status: DC

## 2023-03-13 MED ORDER — APIXABAN 5 MG PO TABS
5.0000 mg | ORAL_TABLET | Freq: Two times a day (BID) | ORAL | Status: DC
  Administered 2023-03-13 – 2023-03-16 (×6): 5 mg via ORAL
  Filled 2023-03-13 (×6): qty 1

## 2023-03-13 MED ORDER — TAMSULOSIN HCL 0.4 MG PO CAPS
0.4000 mg | ORAL_CAPSULE | Freq: Every day | ORAL | Status: DC
  Administered 2023-03-14 – 2023-03-15 (×3): 0.4 mg via ORAL
  Filled 2023-03-13 (×3): qty 1

## 2023-03-13 MED ORDER — SENNOSIDES-DOCUSATE SODIUM 8.6-50 MG PO TABS: 1.0000 | ORAL_TABLET | Freq: Every evening | ORAL | Status: DC | PRN

## 2023-03-13 MED ORDER — PREDNISONE 10 MG PO TABS
10.0000 mg | ORAL_TABLET | Freq: Every day | ORAL | Status: AC
  Administered 2023-03-15 – 2023-03-16 (×2): 10 mg via ORAL
  Filled 2023-03-13 (×2): qty 1

## 2023-03-13 MED ORDER — DICLOFENAC SODIUM 1 % EX GEL
2.0000 g | Freq: Four times a day (QID) | CUTANEOUS | Status: DC
  Administered 2023-03-14 (×2): 2 g via TOPICAL
  Filled 2023-03-13: qty 100

## 2023-03-13 MED ORDER — PRAVASTATIN SODIUM 40 MG PO TABS
20.0000 mg | ORAL_TABLET | Freq: Every day | ORAL | Status: DC
  Administered 2023-03-14 – 2023-03-15 (×2): 20 mg via ORAL
  Filled 2023-03-13 (×2): qty 1

## 2023-03-13 MED ORDER — ACETAMINOPHEN 325 MG PO TABS: 650.0000 mg | ORAL_TABLET | Freq: Four times a day (QID) | ORAL | Status: DC | PRN

## 2023-03-13 MED ORDER — HYDRALAZINE HCL 50 MG PO TABS
100.0000 mg | ORAL_TABLET | Freq: Three times a day (TID) | ORAL | Status: DC
  Administered 2023-03-14 – 2023-03-16 (×8): 100 mg via ORAL
  Filled 2023-03-13 (×8): qty 2

## 2023-03-13 NOTE — Hospital Course (Addendum)
 Acute hypoxic respiratory failure Acute on Chronic diastolic heart failure Patient presented with acute hypoxic respiratory failure requiring supplemental oxygen use due to an acute HFpEF exacerbation most likely from decreasing home diuretic dose from 80mg   twice daily to 80mg   daily.  Patient diuresed well with IV Lasix and was transitioned to home Lasix dose of 80 mg twice daily.  An echocardiogram was obtained which showed an LVEF of 55 to 60%.  Patient had trace pitting edema of the right stump and left lower extremity on day of discharge.  He was not discharged home on supplemental oxygen.  Leukocytosis  Resolved, most likely from prednisone use.   Type 2 Diabetes mellitus Hypoglycemia  Diabetic neuropathy Patient had hypoglycemia twice throughout hospitalization that corrected with oral intake. Hypoglycemia was most likely due to decrease in prednisone and meal time insulin. His regimen was tresiba 50 units, SSI 0-10 units on the day of discharge. We d/c the 10 units of meal time insulin.   Gout Inflammatory arthropathy, R wrist Stable, patient finished prednisone taper here.   Chronic medical conditions CKD 4: BL Cr 3.6. Stable on admission. Avoid nephrotoxic agents  Hx PEDVT: Eliquis 5 mg BID HTN: continued on PTA antihypertensives: amlodipine, carverdilol 25 BID, Hydralazine 100 mg TID. Need to confirm he has clonidine patch HLD: lovastatin 20 mg alternative Overactive bladderBPH: Oxybutynin and Tamsulosin. Bladder scan q shift x4 GERD: Protonix equivalent Constipation: scheduled Miralax and PRN Senna S

## 2023-03-13 NOTE — ED Provider Notes (Signed)
 [place] EMERGENCY DEPARTMENT AT [facility] Provider Note   CSN: [id number] Arrival date & time: 03/13/23  1520     History  Chief Complaint  Patient presents with   Shortness of Breath    [name] is a 48 y.o. male.  48 year old male with past medical history of DVT on Eliquis as well as diabetes and CHF presenting to the emergency department today with lower extremity swelling and shortness of breath.  The patient was admitted a few weeks ago and his Lasix was cut from 80 mg twice per day to 80 mg once per day.  He states that since then he has noted swelling in his left lower extremity and the stump on the right.  He states that today he started to become short of breath.  He went to follow-up with his surgeon for his leg and was told to come here as he was hypoxic.  He was sent here for further evaluation.  He denies any chest pain.  Denies any fevers or chills.   Shortness of Breath      Home Medications Prior to Admission medications   Medication Sig Start Date End Date Taking? Authorizing Provider  acetaminophen (TYLENOL) 325 MG tablet Take 2 tablets (650 mg total) by mouth every 6 (six) hours. 02/26/23   Faith Rogue, DO  amLODipine (NORVASC) 10 MG tablet Take 10 mg by mouth at bedtime.    [provider]  B-D UF III MINI PEN NEEDLES 31G X 5 MM MISC USE 1 NEEDLE FIVE TIMES DAILY 12/25/22   Kathleen Lime, MD  Blood Glucose Monitoring Suppl (ACCU-CHEK GUIDE ME) w/Device KIT Use to check blood sugar up to 3 times a day 05/11/22   Reather Littler, MD  carvedilol (COREG) 25 MG tablet TAKE 1 TABLET BY MOUTH TWICE DAILY WITH A MEAL 12/04/22   Kathleen Lime, MD  cloNIDine (CATAPRES - DOSED IN MG/24 HR) 0.1 mg/24hr patch 0.1 mg once a week. 05/04/21   [provider]  diclofenac Sodium (VOLTAREN) 1 % GEL Apply 2 g topically 4 (four) times daily. 01/31/21   Dolan Amen, MD  ELIQUIS 5 MG TABS tablet Take 1 tablet by mouth twice daily 10/31/22   Kathleen Lime, MD  fluticasone [facility]) 50 MCG/ACT nasal spray Place 2 sprays into both nostrils daily. Patient taking differently: Place 2 sprays into both nostrils daily as needed for allergies. 03/29/20 02/23/24  Dolan Amen, MD  furosemide (LASIX) 80 MG tablet Take 1 tablet by mouth twice daily Patient taking differently: Take 80 mg by mouth 2 (two) times daily. 01/15/20   Dellia Cloud, MD  gabapentin (NEURONTIN) 100 MG capsule TAKE 1 CAPSULE BY MOUTH THREE TIMES DAILY Patient taking differently: Take 100 mg by mouth 2 (two) times daily. 12/04/22   Kathleen Lime, MD  Glucagon (GVOKE HYPOPEN 1-PACK) 1 MG/0.2ML SOAJ Inject 1 mg into the skin as needed (low blood sugar with impaired consciousness). 09/05/22   Thapa, [place], MD  glucose blood (ACCU-CHEK GUIDE) test strip Check blood sugar up to 3 times a day 05/11/22   Reather Littler, MD  hydrALAZINE (APRESOLINE) 100 MG tablet Take 100 mg by mouth 3 (three) times daily. 07/13/20   [provider]  insulin aspart (FIASP) 100 UNIT/ML FlexTouch Pen Inject 10 Units into the skin with breakfast, with lunch, and with evening meal. 02/26/23   Bender, Irving Burton, DO  insulin degludec (TRESIBA FLEXTOUCH) 200 UNIT/ML FlexTouch Pen Inject 54 Units into the skin daily. INJECT 34 UNITS  SUBCUTANEOUSLY ONCE DAILY 02/26/23   Faith Rogue, DO  Insulin Pen Needle (B-D UF III MINI PEN NEEDLES) 31G X 5 MM MISC 1 each by Other route See admin instructions. USE 4-5 TIMES DAILY 03/08/20   Roylene Reason, MD  Insulin Pen Needle (INSUPEN PEN NEEDLES) 32G X 4 MM MISC Use as directed 02/26/23   Faith Rogue, DO  Lancet Devices [facility]) lancets Use to check blood sugars 5 times a day. Dx code:E11.9. Insulin dependent. Patient taking differently: 1 each by Other route See admin instructions. Use to check blood sugars 5 times a day. Dx code:E11.9. Insulin dependent. 08/14/16   Geralyn Corwin Ratliff, DO  lidocaine (HM LIDOCAINE PATCH) 4 % Place 1 patch onto the skin daily.  12/22/21   Adron Bene, MD  lovastatin (MEVACOR) 20 MG tablet Take 1 tablet by mouth once daily 12/04/22   Kathleen Lime, MD  NOVOLOG FLEXPEN 100 UNIT/ML FlexPen Inject 0-4 Units into the skin 3 (three) times daily with meals. Administer 0 units in the morning with breakfast, 0-4 units with lunch, and 0-4 units with dinner daily, maximum 30 units/day 12/06/22 03/06/23  Thapa, [place], MD  oxybutynin (DITROPAN) 5 MG tablet TAKE 1 TABLET BY MOUTH THREE TIMES DAILY 01/30/23   Kathleen Lime, MD  pantoprazole (PROTONIX) 20 MG tablet Take 1 tablet (20 mg total) by mouth daily. 02/27/23   Faith Rogue, DO  polyethylene glycol (MIRALAX / GLYCOLAX) 17 g packet Take 17 g by mouth 2 (two) times daily. Patient taking differently: Take 17 g by mouth as needed for mild constipation. 09/02/19   [place], Carley D, DO  potassium chloride SA (KLOR-CON) 20 MEQ tablet Take 20 mEq by mouth 3 (three) times daily. 11/24/19   [provider]  predniSONE (DELTASONE) 20 MG tablet Take 3 tablets (60 mg total) by mouth daily with breakfast for 8 days, THEN 2.5 tablets (50 mg total) daily with breakfast for 3 days, THEN 2 tablets (40 mg total) daily with breakfast for 3 days, THEN 1.5 tablets (30 mg total) daily with breakfast for 3 days, THEN 1 tablet (20 mg total) daily with breakfast for 3 days, THEN 0.5 tablets (10 mg total) daily with breakfast for 3 days. 02/26/23 03/21/23  Faith Rogue, DO  senna-docusate (SENOKOT-S) 8.6-50 MG tablet Take 1 tablet by mouth daily. Patient taking differently: Take 1 tablet by mouth as needed for mild constipation. 09/26/19   Dolan Amen, MD  tamsulosin [facility]) 0.4 MG CAPS capsule Take 1 capsule by mouth at bedtime 12/04/22   Kathleen Lime, MD  tirzepatide [facility]) 15 MG/0.5ML Pen Inject 15 mg into the skin once a week. 09/05/22   Thapa, [place], MD  traMADol (ULTRAM) 50 MG tablet Take 1 tablet (50 mg total) by mouth every 12 (twelve) hours as needed. Patient taking  differently: Take 50 mg by mouth every 12 (twelve) hours as needed for moderate pain (pain score 4-6). 02/21/23   Cristie Hem, PA-C      Allergies    Reglan [metoclopramide], Metformin and related, Ozempic (0.25 or 0.5 mg-dose) [semaglutide(0.25 or 0.5mg -dos)], and Thiazide-type diuretics    Review of Systems   Review of Systems  Respiratory:  Positive for shortness of breath.   Cardiovascular:  Positive for leg swelling.  All other systems reviewed and are negative.   Physical Exam Updated Vital Signs BP (!) 155/74   Pulse 96   Temp 99.2 F (37.3 C) (Oral)   Resp (!) 21   Ht 6\' 2"  (1.88 m)  Wt (!) 142.9 kg   SpO2 100%   BMI 40.44 kg/m  Physical Exam Vitals and nursing note reviewed.   Gen: NAD, chronically ill-appearing Eyes: PERRL, EOMI HEENT: no oropharyngeal swelling Neck: trachea midline Resp: Exam somewhat difficult due to habitus but there are decreased breath sounds in the lung bases Card: RRR, no murmurs, rubs, or gallops Abd: nontender, nondistended Extremities: The patient does have pitting edema noted to the left lower extremity, right lower extremity is surgically absent with BKA but there is some pitting edema to the thigh Vascular: 2+ radial pulses bilaterally, 2+ DP pulses bilaterally Skin: no rashes Psyc: acting appropriately   ED Results / Procedures / Treatments   Labs (all labs ordered are listed, but only abnormal results are displayed) Labs Reviewed  CBC WITH DIFFERENTIAL/PLATELET - Abnormal; Notable for the following components:      Result Value   WBC 13.5 (*)    RBC 3.25 (*)    Hemoglobin 9.5 (*)    HCT 31.1 (*)    RDW 18.9 (*)    Platelets 149 (*)    Neutro Abs 12.4 (*)    Lymphs Abs 0.2 (*)    All other components within normal limits  BRAIN NATRIURETIC PEPTIDE - Abnormal; Notable for the following components:   B Natriuretic Peptide 276.1 (*)    All other components within normal limits  BASIC METABOLIC PANEL - Abnormal;  Notable for the following components:   CO2 19 (*)    BUN 75 (*)    Creatinine, Ser 2.71 (*)    Calcium 8.3 (*)    GFR, Estimated 28 (*)    All other components within normal limits  I-STAT VENOUS BLOOD GAS, ED - Abnormal; Notable for the following components:   pH, Ven 7.453 (*)    pCO2, Ven 31.6 (*)    pO2, Ven 66 (*)    Calcium, Ion 1.13 (*)    HCT 29.0 (*)    Hemoglobin 9.9 (*)    All other components within normal limits  RESP PANEL BY RT-PCR (RSV, FLU A&B, COVID)  [id number]    EKG None  Radiology DG Chest 2 View Result Date: 03/13/2023 CLINICAL DATA:  Shortness of breath and fluid retention. EXAM: CHEST - 2 VIEW COMPARISON:  10/01/2020 FINDINGS: The heart size and mediastinal contours are within normal limits. Low lung volumes are noted. Both lungs are clear. The visualized skeletal structures are unremarkable. IMPRESSION: Low lung volumes. No active cardiopulmonary disease. Electronically Signed   By: Danae Orleans M.D.   On: 03/13/2023 18:08    Procedures Procedures    Medications Ordered in ED Medications  furosemide (LASIX) injection 80 mg (has no administration in time range)    ED Course/ Medical Decision Making/ A&P                                 Medical Decision Making 48 year old male with past medical history of diabetes, BKA, DVT, and CHF presenting to the emergency department today with symptoms consistent with likely CHF exacerbation.  I will further evaluate him here with a chest x-ray in addition to basic labs to evaluate for pulmonary edema, versus pulmonary infiltrates versus pneumothorax.  The patient is stable here on nasal cannula oxygen.  He is on blood thinners and is denying any chest pain so suspicion for pulmonary embolism is relatively low at this time.  I will recheck the patient's renal function  and consider further diuresis.  The patient's BNP is mildly elevated.  He remains on oxygen here.  He is given Lasix here.  X-ray does not show any  obvious acute findings.  With his ox requirement I think that he would benefit from admission and diuresis while keeping close eye on his renal function.  Amount and/or Complexity of Data Reviewed Labs: ordered. Radiology: ordered.  Risk Prescription drug management.           Final Clinical Impression(s) / ED Diagnoses Final diagnoses:  Hypoxia    Rx / DC Orders ED Discharge Orders     None         Durwin Glaze, MD 03/13/23 1859

## 2023-03-13 NOTE — ED Notes (Signed)
 Inpatient provider at bedside.

## 2023-03-13 NOTE — H&P (Signed)
 Date: 03/13/2023               Patient Name:  [name] MRN: [id number]  DOB: January 14, 1975 Age / Sex: 48 y.o., male   PCP: Rocky Morel, DO         Medical Service: Internal Medicine Teaching Service         Attending Physician: Dr. Reymundo Poll      First Contact: Dr. Faith Rogue, DO Pager [phone]    Second Contact: Dr. Champ Mungo, DO Pager [phone]         After Hours (After 5p/  First Contact Pager: [phone]  weekends / holidays): Second Contact Pager: [phone]   SUBJECTIVE   Chief concern: Shortness of breath  History of Present Illness:   [name] is a [phone] with a prior medical history significant for chronic diastolic heart failure, OSA, CKD4, insulin dependent DM with BMI 40 c/b neuropathy, and gout arthropathy recently discharged from IMTS service on 2/17 after treatment for inflammatory arthropathy who presented to ED on 3/4 for shortness of breath  After his recent hospitalization, patient reports adherence to new dose of Lasix 80 mg daily (BID prior to this) and course of prednisone for gout flare. He noticed progressive worsening swelling in his abdomen, LE extremity up to his knee, and on R stump over the past two weeks. However,  [name]'s breathing difficulty at rest  and with minimal exertion operating his WC started on 3/3. Today, he went to a follow up appointment with Dr. Lajoyce Corners today where he continue to have dyspena and was found to desaturate <89%, which is why he was sent to the ED. En route, patient took an extra dose of lasix, meaning he took 160 mg PO lasix today.  He denies use of therapeutic oxygen at home, chest pain, palpitations, or change in his mental status.  No dizziness or low po intake. He is unable to lay flat at baseline and denies PND. No abdominal pain or changes in bowel movements. No URI symptoms, cough, wheezing, fevers, chills, or new myalgias. Has been compliant with his Eliquis, which he takes twice daily.   Prior gout flare  subsiding on Prednisone taper. Last seen at [facility] where his prednisone taper was sped up for concerns of mood disturbance, now resolved.   Meds:  Amlodipine 10 mg daily Carvedilol 25 mg BID Clonidine patch 0.1 mg patch weekly Gabapentin 100 mg BID Lovastatin 20 mg daily Hydralazine 100 mg TID  Prednisone taper - 10 mg decrease every two days to tend on 3/8 Tresiba 54 daily last dose 3/4 AM Novolog 10 u with meals Mounjaro 15 mg on wednesdays Protonix 20 mg daily Oxybutynin 5 mg daily Tamsulosin 0.4 mg daily Eliquis 5 mg BID  Past Medical History  Diabetes Mellitus Normocytic anemia Diastolic HF Hx DVT and PE HTN HLD BMI 40 Gout Urinary retention Depression Erectile dysfunction GERD Hx of DVT and PE on chronic DOAC  Past Surgical History:  Procedure Laterality Date   AMPUTATION Right 12/22/2013   Procedure: AMPUTATION BELOW KNEE;  Surgeon: Toni Arthurs, MD;  Location: [facility] OR;  Service: Orthopedics;  Laterality: Right;   AMPUTATION Right 02/19/2014   Procedure: RIGHT ABOVE KNEE AMPUTATION ;  Surgeon: Toni Arthurs, MD;  Location: [facility] OR;  Service: Orthopedics;  Laterality: Right;   blood clot removal  2016   CARDIAC CATHETERIZATION  02/18/2013   normal coronaries   ESOPHAGOGASTRODUODENOSCOPY N/A 11/02/2016   Procedure: ESOPHAGOGASTRODUODENOSCOPY (EGD);  Surgeon: Iva Boop, MD;  Location: [facility] ENDOSCOPY;  Service: Endoscopy;  Laterality: N/A;   I & D EXTREMITY Right 12/14/2013   Procedure: IRRIGATION AND DEBRIDEMENT RIGHT FOOT;  Surgeon: Verlee Rossetti, MD;  Location: [facility] OR;  Service: Orthopedics;  Laterality: Right;   INCISION AND DRAINAGE ABSCESS  2007; 2015   "back"   INCISION AND DRAINAGE OF WOUND Right 12/22/2013   Procedure: I&D RIGHT BUTTOCK;  Surgeon: Toni Arthurs, MD;  Location: [facility] OR;  Service: Orthopedics;  Laterality: Right;   LEFT HEART CATHETERIZATION WITH CORONARY ANGIOGRAM N/A 02/18/2013   Procedure: LEFT HEART CATHETERIZATION WITH CORONARY ANGIOGRAM;   Surgeon: Peter M Swaziland, MD;  Location: [facility] CATH LAB;  Service: Cardiovascular;  Laterality: N/A;    Social:  Lives alone, he is currently a Administrator, sports in main Support: from brother and girlfriend Level of Function: independent for ADL and most iDLs. Mobility impaired due to R AKA with WC  Continent: yes with a history of urinary retention on medication PCP: Rocky Morel, DO Substances: denies tobacco, EtOH, IV or inhaled drugs  Family History: Non contributory  Allergies: Allergies as of 03/13/2023 - Reviewed 03/13/2023  Allergen Reaction Noted   Reglan [metoclopramide] Other (See Comments) 11/01/2016   Metformin and related Diarrhea 08/19/2019   Ozempic (0.25 or 0.5 mg-dose) [semaglutide(0.25 or 0.5mg -dos)] Nausea And Vomiting 08/19/2019   Thiazide-type diuretics  02/24/2023    Review of Systems: A complete ROS was negative except as per HPI.   OBJECTIVE:   Physical Exam: Blood pressure (!) 153/74, pulse 94, temperature 99.2 F (37.3 C), temperature source Oral, resp. rate 19, height 6\' 2"  (1.88 m), weight (!) 142.9 kg, Saturation 91% on 3L [place] Dry weight:309lb pm 2/13 --> 315 lb today  Constitutional: well-appearing man sitting on side of bed wearing [place] in no acute distress HENT: mucous membranes moist Cardiovascular: regular rate and rhythm, no m/r/g, JVD to mid neck while at 45 degree angle with sustained HJR Pulmonary/Chest: speaking in full sentences, normal work of breathing now on room air, lungs clear to auscultation bilaterally. No crackles or wheezing. Abdominal: soft, non-tender, protuberant and distended with BMI of 40. Difficult to assess fluid wave but no shifting dullness. No spider angiomatas Neurological: alert & oriented x 3 MSK: brawny edema on bilateral flanks and on lower abdomen. R AKA with 2+ pitting edema in L lower extremity up to L mid thigh. 2+ pitting edema up to mid thigh on the R.  Skin: warm and dry Psych: Pleasant mood and  affect  Labs: CBC    Latest Ref Rng & Units 03/13/2023    5:34 PM 03/13/2023    3:30 PM 03/01/2023    9:42 AM  CBC  WBC 4.0 - 10.5 K/uL  13.5    Hemoglobin 13.0 - 17.0 g/dL 9.9  9.5  9.6   Hematocrit 39.0 - 52.0 % 29.0  31.1    Platelets 150 - 400 K/uL  149     Cbc with diff: WBC 13.5, neutrophil predominant 12.4  BMP    Latest Ref Rng & Units 03/13/2023    5:34 PM 03/13/2023    3:30 PM 03/01/2023    9:42 AM  BMP  Glucose 70 - 99 mg/dL  96  130   BUN 6 - 20 mg/dL  75  76   Creatinine 8.65 - 1.24 mg/dL  7.84  6.96   Sodium 295 - 145 mmol/L 141  140  136   Potassium 3.5 - 5.1 mmol/L 4.2  3.9  4.2  Chloride 98 - 111 mmol/L  110  106   CO2 22 - 32 mmol/L  19  20   Calcium 8.9 - 10.3 mg/dL  8.3  8.4   Cr at baseline 2.71 -3, GFR 28  Hepatic pending BNP 276 vBG 7.431.6 RVP neg  Imaging: DG Chest 2 View Result Date: 03/13/2023 CLINICAL DATA:  Shortness of breath and fluid retention. EXAM: CHEST - 2 VIEW COMPARISON:  10/01/2020 FINDINGS: The heart size and mediastinal contours are within normal limits. Low lung volumes are noted. Both lungs are clear. The visualized skeletal structures are unremarkable. IMPRESSION: Low lung volumes. No active cardiopulmonary disease. Electronically Signed   By: Danae Orleans M.D.   On: 03/13/2023 18:08    EKG: personally reviewed my interpretation sinus rhythm, LAD enlargement on lead II with lateral T wave abnormalities seen on prior EKGs.   ASSESSMENT & PLAN:   Assessment & Plan by Problem: Principal Problem:   Acute hypoxic respiratory failure (HCC)   [name] is a 47yoM with a prior medical history significant for chronic diastolic heart failure, OSA, CKD4, insulin dependent DM with BMI 40 c/b neuropathy, and gout arthropathy recently discharged from IMTS service on 2/17 after treatment for inflammatory arthropathy who presented to ED on 3/4 for shortness of breath admitted for acute hypoxic respiratory failure on hospital day 0   Acute  hypoxic respiratory failure Acute on Chronic diastolic heart failure New supplemental oxygen requirement with clinical signs of volume overload likely in the setting of chronic diastolic HF exacerbation vs new HFrEF. Last TTE  in 2022 LVEF 60-65% with concentric LV hypertrophy and mild L atrial enlargement. No MS or AS. Patient has had a recent change in diuretic dose that could be contributing. No infectious sxs, signs, or CXR findings to suspect viral or bacterial cause. He does carry a history of prior PE however has remained adherent to DOAC, low suspicion. S/p 160 mg total PO lasix today after self administering second 80 mg dose. He also received lasix IV 80 mg in ED with improvement in O2 saturation now on room air. Renal function at his baseline.  Plan - TTE this admission ordered - Wean O2 as tolerated  - Patient cannot ambulate, but will PT eval and monitor O2 use - s/p Furosemide IV 80 mg in the ED - S/p 80 mg PO furosemide x2 prior to ED - Suggest another 80 mg IV in the AM if RFP allows - Strict in/out - Daily weights - Monitor K, Mg, renal function - If clinical deterioration, consider d-dmer  Leukocytosis Suspect neutrophil demargination in setting of recent use of steroids.  - Monitor fever curve  Type 2 Diabetes mellitus Diabetic neuropathy Currently on Tresiba 54 units daily (last dose 3/4), Novolog 10 units with meals, Mounjaro 15 mg weekly since started on steroids. - Will need dose Semglee for tomorrow night with Degludec lasting ~48 hrs in the system - Novolg 10 units daily - SSI  - Monitor for hypo/hyperglycemia - May need home adjustment prior to discharge - Gabapentin 100 mg BID  Gout Inflammatory arthropathy, R wrist Change in prednisone taper due to mood disturbances that have now resolved Prednisone taper - 20 mg 3/5 -10 mg 3/6 - 3/7  Chronic medical conditions CKD 4: BL Cr 3.6. Stable on admission. Avoid nephrotoxic agents  Hx PEDVT: Eliquis 5 mg  BID HTN: continued on PTA antihypertensives: amlodipine, carverdilol 25 BID, Hydralazine 100 mg TID. Need to confirm he has clonidine patch HLD: lovastatin  20 mg alternative Overactive bladderBPH: Oxybutynin and Tamsulosin. Bladder scan q shift x4 GERD: Protonix equivalent Constipation: scheduled Miralax and PRN Senna S  Diet: Renal with fluid restriction Fluids: VTE: Heparin Code: Full  Prior to Admission Living Arrangement: Home, living alone Anticipated Discharge Location: Home Barriers to Discharge: clinical improvement  Dispo: Admit patient to Observation with expected length of stay less than 2 midnights.  Signed:   Morene Crocker, MD Internal Medicine Resident PGY-2 03/13/2023, 11:01 PM   Please contact the on call pager after 5 pm and on weekends at [phone].

## 2023-03-13 NOTE — Telephone Encounter (Signed)
 Call  from patient states he saw Dr. Geraldo Pitter last week .  Was told to cut his Furosemide back to 1 pill a day.  Patient states is having some shortness of breath as well as leg swelling.  Transferred to Dr. Annie Paras to discuss what he needs to do.

## 2023-03-13 NOTE — ED Triage Notes (Signed)
 Pt to ED via [facility] from ortho office for apt r/t BKA right. Pt had noticed increased swelling to the area. Pt also c/o increase SHOB today. Hx chf. Denies CP.  Room air sat  89% on EMS arrival,.  Recent medication changes, reports lasix dose cut in half   Last VS: 99%2l, 173/85, HR 102, CBG 97   No medications given by EMS.

## 2023-03-13 NOTE — ED Provider Triage Note (Signed)
 Emergency Medicine Provider Triage Evaluation Note  Srijan Givan , a 48 y.o. male  was evaluated in triage.  Pt complains of shortness of breath seen in Dr. Audrie Lia office today for follow-up noted to have hypoxia at 89% on room air.  Recent decrease in his Lasix dose after hospitalization.  He has increased swelling all over his body complains of shortness of breath.  Review of Systems  Positive: Sob, hypoxia Negative: fever  Physical Exam  BP (!) 159/73 (BP Location: Right Arm)   Pulse (!) 102   Temp 99.2 F (37.3 C) (Oral)   Resp 18   Ht 6\' 2"  (1.88 m)   Wt (!) 142.9 kg   SpO2 93%   BMI 40.44 kg/m  Gen:   Awake, no distress   Resp:  Normal effort  MSK:   Moves extremities without difficulty  Other:  Crackles in lung bases  Medical Decision Making  Medically screening exam initiated at 4:51 PM.  Appropriate orders placed.  [name] was informed that the remainder of the evaluation will be completed by another provider, this initial triage assessment does not replace that evaluation, and the importance of remaining in the ED until their evaluation is complete.     Arthor Captain, PA-C 03/13/23 1655

## 2023-03-14 ENCOUNTER — Observation Stay (HOSPITAL_COMMUNITY)

## 2023-03-14 DIAGNOSIS — K5900 Constipation, unspecified: Secondary | ICD-10-CM | POA: Diagnosis present

## 2023-03-14 DIAGNOSIS — Z79899 Other long term (current) drug therapy: Secondary | ICD-10-CM | POA: Diagnosis not present

## 2023-03-14 DIAGNOSIS — J9601 Acute respiratory failure with hypoxia: Secondary | ICD-10-CM

## 2023-03-14 DIAGNOSIS — Z86711 Personal history of pulmonary embolism: Secondary | ICD-10-CM | POA: Diagnosis not present

## 2023-03-14 DIAGNOSIS — J9621 Acute and chronic respiratory failure with hypoxia: Secondary | ICD-10-CM | POA: Diagnosis present

## 2023-03-14 DIAGNOSIS — E119 Type 2 diabetes mellitus without complications: Secondary | ICD-10-CM | POA: Diagnosis not present

## 2023-03-14 DIAGNOSIS — E785 Hyperlipidemia, unspecified: Secondary | ICD-10-CM | POA: Diagnosis present

## 2023-03-14 DIAGNOSIS — R0902 Hypoxemia: Secondary | ICD-10-CM | POA: Diagnosis present

## 2023-03-14 DIAGNOSIS — I130 Hypertensive heart and chronic kidney disease with heart failure and stage 1 through stage 4 chronic kidney disease, or unspecified chronic kidney disease: Secondary | ICD-10-CM | POA: Diagnosis present

## 2023-03-14 DIAGNOSIS — N401 Enlarged prostate with lower urinary tract symptoms: Secondary | ICD-10-CM | POA: Diagnosis present

## 2023-03-14 DIAGNOSIS — Z6841 Body Mass Index (BMI) 40.0 and over, adult: Secondary | ICD-10-CM | POA: Diagnosis not present

## 2023-03-14 DIAGNOSIS — R0609 Other forms of dyspnea: Secondary | ICD-10-CM

## 2023-03-14 DIAGNOSIS — I5041 Acute combined systolic (congestive) and diastolic (congestive) heart failure: Secondary | ICD-10-CM | POA: Diagnosis not present

## 2023-03-14 DIAGNOSIS — G4733 Obstructive sleep apnea (adult) (pediatric): Secondary | ICD-10-CM | POA: Diagnosis present

## 2023-03-14 DIAGNOSIS — K219 Gastro-esophageal reflux disease without esophagitis: Secondary | ICD-10-CM | POA: Diagnosis present

## 2023-03-14 DIAGNOSIS — Z7985 Long-term (current) use of injectable non-insulin antidiabetic drugs: Secondary | ICD-10-CM | POA: Diagnosis not present

## 2023-03-14 DIAGNOSIS — Z86718 Personal history of other venous thrombosis and embolism: Secondary | ICD-10-CM | POA: Diagnosis not present

## 2023-03-14 DIAGNOSIS — E1122 Type 2 diabetes mellitus with diabetic chronic kidney disease: Secondary | ICD-10-CM | POA: Diagnosis present

## 2023-03-14 DIAGNOSIS — E1140 Type 2 diabetes mellitus with diabetic neuropathy, unspecified: Secondary | ICD-10-CM | POA: Diagnosis present

## 2023-03-14 DIAGNOSIS — Z794 Long term (current) use of insulin: Secondary | ICD-10-CM | POA: Diagnosis not present

## 2023-03-14 DIAGNOSIS — D72829 Elevated white blood cell count, unspecified: Secondary | ICD-10-CM | POA: Diagnosis present

## 2023-03-14 DIAGNOSIS — E11649 Type 2 diabetes mellitus with hypoglycemia without coma: Secondary | ICD-10-CM | POA: Diagnosis not present

## 2023-03-14 DIAGNOSIS — N184 Chronic kidney disease, stage 4 (severe): Secondary | ICD-10-CM | POA: Diagnosis present

## 2023-03-14 DIAGNOSIS — Z7901 Long term (current) use of anticoagulants: Secondary | ICD-10-CM | POA: Diagnosis not present

## 2023-03-14 DIAGNOSIS — E16A2 Hypoglycemia level 2: Secondary | ICD-10-CM | POA: Diagnosis not present

## 2023-03-14 DIAGNOSIS — Z89611 Acquired absence of right leg above knee: Secondary | ICD-10-CM | POA: Diagnosis not present

## 2023-03-14 DIAGNOSIS — I5033 Acute on chronic diastolic (congestive) heart failure: Secondary | ICD-10-CM | POA: Diagnosis present

## 2023-03-14 DIAGNOSIS — M109 Gout, unspecified: Secondary | ICD-10-CM | POA: Diagnosis present

## 2023-03-14 LAB — GLUCOSE, CAPILLARY
Glucose-Capillary: 151 mg/dL — ABNORMAL HIGH (ref 70–99)
Glucose-Capillary: 156 mg/dL — ABNORMAL HIGH (ref 70–99)
Glucose-Capillary: 228 mg/dL — ABNORMAL HIGH (ref 70–99)
Glucose-Capillary: 47 mg/dL — ABNORMAL LOW (ref 70–99)
Glucose-Capillary: 72 mg/dL (ref 70–99)
Glucose-Capillary: 94 mg/dL (ref 70–99)
Glucose-Capillary: 99 mg/dL (ref 70–99)

## 2023-03-14 LAB — CBC WITH DIFFERENTIAL/PLATELET
Abs Immature Granulocytes: 0.05 10*3/uL (ref 0.00–0.07)
Basophils Absolute: 0 10*3/uL (ref 0.0–0.1)
Basophils Relative: 0 %
Eosinophils Absolute: 0 10*3/uL (ref 0.0–0.5)
Eosinophils Relative: 0 %
HCT: 27.3 % — ABNORMAL LOW (ref 39.0–52.0)
Hemoglobin: 8.6 g/dL — ABNORMAL LOW (ref 13.0–17.0)
Immature Granulocytes: 1 %
Lymphocytes Relative: 11 %
Lymphs Abs: 1 10*3/uL (ref 0.7–4.0)
MCH: 30 pg (ref 26.0–34.0)
MCHC: 31.5 g/dL (ref 30.0–36.0)
MCV: 95.1 fL (ref 80.0–100.0)
Monocytes Absolute: 0.7 10*3/uL (ref 0.1–1.0)
Monocytes Relative: 8 %
Neutro Abs: 7.3 10*3/uL (ref 1.7–7.7)
Neutrophils Relative %: 80 %
Platelets: 145 10*3/uL — ABNORMAL LOW (ref 150–400)
RBC: 2.87 MIL/uL — ABNORMAL LOW (ref 4.22–5.81)
RDW: 18.6 % — ABNORMAL HIGH (ref 11.5–15.5)
WBC: 9.1 10*3/uL (ref 4.0–10.5)
nRBC: 0 % (ref 0.0–0.2)

## 2023-03-14 LAB — ECHOCARDIOGRAM COMPLETE
Area-P 1/2: 5.23 cm2
Height: 74 in
S' Lateral: 3.3 cm
Weight: 5040 [oz_av]

## 2023-03-14 LAB — RENAL FUNCTION PANEL
Albumin: 2.6 g/dL — ABNORMAL LOW (ref 3.5–5.0)
Anion gap: 6 (ref 5–15)
BUN: 72 mg/dL — ABNORMAL HIGH (ref 6–20)
CO2: 23 mmol/L (ref 22–32)
Calcium: 8.4 mg/dL — ABNORMAL LOW (ref 8.9–10.3)
Chloride: 110 mmol/L (ref 98–111)
Creatinine, Ser: 2.49 mg/dL — ABNORMAL HIGH (ref 0.61–1.24)
GFR, Estimated: 31 mL/min — ABNORMAL LOW (ref >60)
Glucose, Bld: 70 mg/dL (ref 70–99)
Phosphorus: 4.2 mg/dL (ref 2.5–4.6)
Potassium: 3.4 mmol/L — ABNORMAL LOW (ref 3.5–5.1)
Sodium: 139 mmol/L (ref 135–145)

## 2023-03-14 LAB — MAGNESIUM: Magnesium: 1.9 mg/dL (ref 1.7–2.4)

## 2023-03-14 MED ORDER — CLONIDINE HCL 0.1 MG/24HR TD PTWK
0.1000 mg | MEDICATED_PATCH | TRANSDERMAL | Status: DC
  Administered 2023-03-14: 0.1 mg via TRANSDERMAL
  Filled 2023-03-14: qty 1

## 2023-03-14 MED ORDER — POTASSIUM CHLORIDE CRYS ER 20 MEQ PO TBCR
40.0000 meq | EXTENDED_RELEASE_TABLET | Freq: Once | ORAL | Status: AC
  Administered 2023-03-14: 40 meq via ORAL
  Filled 2023-03-14: qty 2

## 2023-03-14 MED ORDER — INSULIN ASPART 100 UNIT/ML IJ SOLN
7.0000 [IU] | Freq: Three times a day (TID) | INTRAMUSCULAR | Status: DC
  Administered 2023-03-14 – 2023-03-16 (×6): 7 [IU] via SUBCUTANEOUS

## 2023-03-14 MED ORDER — FUROSEMIDE 10 MG/ML IJ SOLN
80.0000 mg | Freq: Two times a day (BID) | INTRAMUSCULAR | Status: DC
  Administered 2023-03-14 (×2): 80 mg via INTRAVENOUS
  Filled 2023-03-14 (×2): qty 8

## 2023-03-14 MED ORDER — INSULIN GLARGINE 100 UNIT/ML ~~LOC~~ SOLN
54.0000 [IU] | SUBCUTANEOUS | Status: DC
  Administered 2023-03-14 – 2023-03-16 (×3): 54 [IU] via SUBCUTANEOUS
  Filled 2023-03-14 (×4): qty 0.54

## 2023-03-14 MED ORDER — FUROSEMIDE 40 MG PO TABS
80.0000 mg | ORAL_TABLET | Freq: Two times a day (BID) | ORAL | Status: DC
  Filled 2023-03-14: qty 2

## 2023-03-14 NOTE — Care Management Obs Status (Cosign Needed)
 MEDICARE OBSERVATION STATUS NOTIFICATION   Patient Details  Name: [name] MRN: [id number] Date of Birth: 03/02/1975   Medicare Observation Status Notification Given:  Yes    Janae Bridgeman, RN 03/14/2023, 3:15 PM

## 2023-03-14 NOTE — Progress Notes (Signed)
 Internal Medicine Clinic Attending  Case discussed with the resident at the time of the visit.  We reviewed the resident's history and exam and pertinent patient test results.  I agree with the assessment, diagnosis, and plan of care documented in the resident's note.

## 2023-03-14 NOTE — Plan of Care (Signed)

## 2023-03-14 NOTE — Addendum Note (Signed)
 Addended by: Dickie La on: 03/14/2023 10:47 AM   Modules accepted: Level of Service

## 2023-03-14 NOTE — Progress Notes (Signed)
 Transition of Care [facility]) - Inpatient Brief Assessment   Patient Details  Name: [name] MRN: [id number] Date of Birth: 08/31/1975  [facility]) CM/SW Contact:    Janae Bridgeman, RN Phone Number: 03/14/2023, 3:22 PM   Clinical Narrative: CM met with the patient at the bedside to discuss TOC needs.  The patient lives alone and plans to return to his apartment when medically stable for discharge.  [place] letter provided to the patient at the bedside.  DME at the home includes WC, Right leg prosthesis, shower bench, RW.  Patient utilizes [employer] [place] for transportation assistance to appointments.  Patient is currently on oxygen in the hospital.  The patient was offered home health services but he declines at this time and plans to return home and go to the gym instead since the patient attends the gym for strengthening per patient.   Transition of Care Asessment: Insurance and Status: (P) Insurance coverage has been reviewed Patient has primary care physician: (P) Yes Home environment has been reviewed: (P) from home alone Prior level of function:: (P) wheelchair (right AKA) RW, shower bench, Right leg prosthesis Prior/Current Home Services: (P) No current home services Social Drivers of Health Review: (P) SDOH reviewed interventions complete Readmission risk has been reviewed: (P) Yes Transition of care needs: (P) transition of care needs identified, TOC will continue to follow

## 2023-03-14 NOTE — Progress Notes (Signed)
 Tech reported to nurse that pt blood sugar was 47 this morning, pt was A/oX4 .Marland KitchenMarland Kitchen There were no hypoglycemia orders in place, gave pt juice and popsicles and stayed at bedside. Once pt had breakfast blood sugar recheck was to 99 and patient stated that he felt fine. Had RN place glucose tablets at bedside

## 2023-03-14 NOTE — Evaluation (Signed)
 Occupational Therapy Evaluation Patient Details Name: [name] MRN: [id number] DOB: 18-Sep-1975 Today's Date: 03/14/2023   History of Present Illness   Pt is a 48 y.o. male admitted 3/4 for acute hypoxic respiratory failure. He presented to the ED from his ortho office visit. He reports his lasix dose was lowered during his last hospitalization and he has been experiencing increased fluid retension. PMH: R AKA, anxiety, CHF, chronic renal disease, depression, ED, hyperlipidemia, HTN, nephrotic syndrome, obesity, OSA, peripheral edema, DVT, PE, pyelonephritis, type I DM, urinary retention     Clinical Impressions Pt c/o pain and swelling to right lower residual extremity, 4/10. Pt lives alone, has PCA asst 5X/week from 9-3. Pt mod I at baseline for ADLs, uses RW with/without prosthetic, transfers to w/c mod I. Pt currently at baseline, limited by SOB and fatigue. Pt O2 remained above 92% during activity, able to transfer to w/c mod I, propelled w/c 1000 feet, Ot maintained above 95%, recovered quickly on room air. Pt at this time states he has no further OT or follow up needs, no DME needs. Pt has w/c, RW, tub bench.      If plan is discharge home, recommend the following:   Assist for transportation     Functional Status Assessment   Patient has had a recent decline in their functional status and demonstrates the ability to make significant improvements in function in a reasonable and predictable amount of time.     Equipment Recommendations   None recommended by OT     Recommendations for Other Services         Precautions/Restrictions   Precautions Precautions: Fall Recall of Precautions/Restrictions: Intact Restrictions Weight Bearing Restrictions Per Provider Order: No     Mobility Bed Mobility Overal bed mobility: Modified Independent                  Transfers Overall transfer level: Modified independent                         Balance Overall balance assessment: Mild deficits observed, not formally tested                                         ADL either performed or assessed with clinical judgement   ADL Overall ADL's : Modified independent;At baseline                                       General ADL Comments: Pt close to or at baseline, able to transfer to w/c mod I, wheeled     Vision Baseline Vision/History: 0 No visual deficits Ability to See in Adequate Light: 0 Adequate Patient Visual Report: No change from baseline       Perception         Praxis         Pertinent Vitals/Pain Pain Assessment Pain Assessment: 0-10 Pain Score: 4  Pain Location: R residual extremity, swollen Pain Descriptors / Indicators: Aching, Discomfort Pain Intervention(s): Monitored during session     Extremity/Trunk Assessment Upper Extremity Assessment Upper Extremity Assessment: Overall WFL for tasks assessed   Lower Extremity Assessment Lower Extremity Assessment: Defer to PT evaluation       Communication Communication Communication: No apparent difficulties   Cognition Arousal: Alert Behavior During  Therapy: WFL for tasks assessed/performed Cognition: No apparent impairments                               Following commands: Intact       Cueing  General Comments   Cueing Techniques: Verbal cues      Exercises     Shoulder Instructions      Home Living Family/patient expects to be discharged to:: Private residence Living Arrangements: Alone Available Help at Discharge: Personal care attendant;Available PRN/intermittently Type of Home: Apartment Home Access: Level entry     Home Layout: One level     Bathroom Shower/Tub: Chief Strategy Officer: Standard         Additional Comments: PCA M-F 7-3, assists with cleaning, some cooking,      Prior Functioning/Environment Prior Level of Function : Independent/Modified  Independent;Other (comment)             Mobility Comments: Uses a RW in the home (with/without prosthesis); uses a wheelchair for community access; keeps a RW at the gym to use ADLs Comments: Independent with ADLs; aide assists with cooking and cleaning    OT Problem List: Decreased activity tolerance;Impaired balance (sitting and/or standing);Pain;Increased edema   OT Treatment/Interventions:        OT Goals(Current goals can be found in the care plan section)   Acute Rehab OT Goals Patient Stated Goal: to return home OT Goal Formulation: With patient Time For Goal Achievement: 03/28/23 Potential to Achieve Goals: Good   OT Frequency:       Co-evaluation              AM-PAC OT "6 Clicks" Daily Activity     Outcome Measure Help from another person eating meals?: None Help from another person taking care of personal grooming?: None Help from another person toileting, which includes using toliet, bedpan, or urinal?: None Help from another person bathing (including washing, rinsing, drying)?: None Help from another person to put on and taking off regular upper body clothing?: None Help from another person to put on and taking off regular lower body clothing?: None 6 Click Score: 24   End of Session Nurse Communication: Mobility status  Activity Tolerance: Patient tolerated treatment well Patient left: in chair;with call bell/phone within reach  OT Visit Diagnosis: Muscle weakness (generalized) (M62.81);Other abnormalities of gait and mobility (R26.89);Pain Pain - Right/Left: Right Pain - part of body: Leg                Time: 1610-9604 OT Time Calculation (min): 27 min Charges:  OT General Charges $OT Visit: 1 Visit OT Evaluation $OT Eval Low Complexity: 1 Low OT Treatments $Therapeutic Activity: 8-22 mins  [address], OTR/L   Alexis Goodell 03/14/2023, 4:29 PM

## 2023-03-14 NOTE — Evaluation (Signed)
 Physical Therapy Evaluation Patient Details Name: [name] MRN: [id number] DOB: 24-Feb-1975 Today's Date: 03/14/2023  History of Present Illness  Pt is a 48 y.o. male admitted 3/4 for acute hypoxic respiratory failure. He presented to the ED from his ortho office visit. He reports his lasix dose was lowered during his last hospitalization and he has been experiencing increased fluid retension. PMH: R AKA, anxiety, CHF, chronic renal disease, depression, ED, hyperlipidemia, HTN, nephrotic syndrome, obesity, OSA, peripheral edema, DVT, PE, pyelonephritis, type I DM, urinary retention   Clinical Impression  Pt admitted with above diagnosis. PTA pt lived at home alone, mod I mobility using RW in home and w/c in community. He has PCA M-F. Pt currently with functional limitations due to the deficits listed below (see PT Problem List). On eval, he demo mod I bed mobility and good sitting balance. Declining transfer to recliner due to not feeling well, blood sugar 47. Provided with blue theraband and performed UE exercises EOB. Pt on 2L on arrival with SpO2 97%. Assessed on RA: SpO2 93% at rest and 86% during activity. 2L replaced with quick recovery to 90s. Pt will benefit from acute skilled PT to increase their independence and safety with mobility to allow discharge. Pt is very motivated to mobilize. Suspect he will progress well. Recommend OPPT at d/c.          If plan is discharge home, recommend the following: A little help with walking and/or transfers;Assistance with cooking/housework   Can travel by private vehicle        Equipment Recommendations None recommended by PT  Recommendations for Other Services       Functional Status Assessment Patient has had a recent decline in their functional status and demonstrates the ability to make significant improvements in function in a reasonable and predictable amount of time.     Precautions / Restrictions Precautions Recall of  Precautions/Restrictions: Intact Precaution/Restrictions Comments: no history of falls      Mobility  Bed Mobility Overal bed mobility: Modified Independent                  Transfers Overall transfer level: Modified independent                 General transfer comment: lateral scoots along EOB spanning entire length of bed. Declining transfer to w/c due to not feeling well. CBG 47. Drinking OJ    Ambulation/Gait                  Stairs            Wheelchair Mobility     Tilt Bed    Modified Rankin (Stroke Patients Only)       Balance Overall balance assessment: Needs assistance Sitting-balance support: No upper extremity supported, Feet supported Sitting balance-[name]: Good                                       Pertinent Vitals/Pain Pain Assessment Pain Assessment: Faces Faces Pain Scale: No hurt    Home Living Family/patient expects to be discharged to:: Private residence Living Arrangements: Alone Available Help at Discharge: Personal care attendant;Available PRN/intermittently Type of Home: Apartment Home Access: Level entry       Home Layout: One level Home Equipment: Agricultural consultant (2 wheels);Wheelchair - manual;Tub bench Additional Comments: Typicall wears prosthesis RLE. Has not been able to don for approx  1 week due to edema.    Prior Function Prior Level of Function : Independent/Modified Independent;Other (comment)             Mobility Comments: Uses a RW in the home (with/without prosthesis); uses a wheelchair for community access; keeps a RW at the gym to use ADLs Comments: Independent with ADLs; aide assists with cooking and cleaning     Extremity/Trunk Assessment   Upper Extremity Assessment Upper Extremity Assessment: Overall WFL for tasks assessed RUE Deficits / Details: Provided with blue theraband    Lower Extremity Assessment Lower Extremity Assessment: RLE  deficits/detail;LLE deficits/detail RLE Deficits / Details: h/o AKA (2016), edema noted LLE Deficits / Details: edema noted, strength intact    Cervical / Trunk Assessment Cervical / Trunk Assessment: Normal  Communication   Communication Communication: No apparent difficulties    Cognition Arousal: Alert Behavior During Therapy: WFL for tasks assessed/performed   PT - Cognitive impairments: No apparent impairments                         Following commands: Intact       Cueing       General Comments General comments (skin integrity, edema, etc.): SpO2 97% at rest on 2L. O2 removed. On RA, SpO2 93%. Desat to 86% during mobility. 2L replaced with recovery to 90s.    Exercises General Exercises - Upper Extremity Shoulder Flexion: AROM, Right, Left, 10 reps, Seated, Theraband Theraband Level (Shoulder Flexion): Level 4 (Blue) Shoulder Extension: AROM, Right, Left, 10 reps, Seated, Theraband Theraband Level (Shoulder Extension): Level 4 (Blue) Shoulder Horizontal ABduction: AROM, Both, 10 reps, Seated, Theraband Theraband Level (Shoulder Horizontal Abduction): Level 4 (Blue)   Assessment/Plan    PT Assessment Patient needs continued PT services  PT Problem List Decreased mobility;Cardiopulmonary status limiting activity;Decreased activity tolerance       PT Treatment Interventions Gait training;Functional mobility training;Therapeutic activities;Therapeutic exercise;Balance training;Patient/family education;Wheelchair mobility training    PT Goals (Current goals can be found in the Care Plan section)  Acute Rehab PT Goals Patient Stated Goal: get back to the gym PT Goal Formulation: With patient Time For Goal Achievement: 03/28/23 Potential to Achieve Goals: Good    Frequency Min 2X/week     Co-evaluation               AM-PAC PT "6 Clicks" Mobility  Outcome Measure Help needed turning from your back to your side while in a flat bed without using  bedrails?: None Help needed moving from lying on your back to sitting on the side of a flat bed without using bedrails?: None Help needed moving to and from a bed to a chair (including a wheelchair)?: A Little Help needed standing up from a chair using your arms (e.g., wheelchair or bedside chair)?: A Little Help needed to walk in hospital room?: A Lot Help needed climbing 3-5 steps with a railing? : Total 6 Click Score: 17    End of Session   Activity Tolerance: Treatment limited secondary to medical complications (Comment) (low blood sugar) Patient left: in bed;with call bell/phone within reach Nurse Communication: Mobility status PT Visit Diagnosis: Other abnormalities of gait and mobility (R26.89)    Time: 6962-9528 PT Time Calculation (min) (ACUTE ONLY): 16 min   Charges:   PT Evaluation $PT Eval Low Complexity: 1 Low   PT General Charges $$ ACUTE PT VISIT: 1 Visit         Ferd Glassing., PT  Office # [phone]   Ilda Foil 03/14/2023, 10:19 AM

## 2023-03-14 NOTE — Progress Notes (Signed)
 Patient requested clonidine patch as the one he has is one week old and expired. He currently uses 0.1mg /24hr transdermal patch, which he changes weekly.  - Ordered

## 2023-03-14 NOTE — Progress Notes (Signed)
   Subjective:  [name] is a [phone] with a prior medical history significant for chronic diastolic heart failure, OSA, CKD4, insulin dependent DM with BMI 40 c/b neuropathy, and gout arthropathy recently discharged from IMTS service on 2/17 after treatment for inflammatory arthropathy who presented to ED on 3/4 for shortness of breath and was admitted for a HFpEF exacerbation.   Today, patient expressed his concerns of the lasix dose decrease. He was frustrated and felt that he was not heard when the dose was reduced and not titrated upwards. His presenting symptoms have improved.   Objective:  Vital signs in last 24 hours: Vitals:   03/14/23 0022 03/14/23 0041 03/14/23 0533 03/14/23 0820  BP: (!) 159/84 (!) 159/84 (!) 157/75 (!) 142/74  Pulse: 90  93 94  Resp: 18  18 19   Temp: 98.9 F (37.2 C)  97.9 F (36.6 C) 99.1 F (37.3 C)  TempSrc: Oral  Oral Oral  SpO2: 97%  100% 99%  Weight:      Height:       Physical Exam: General:NAD, sitting in bed  Cardiac:RRR, no murmur appreciated  Pulmonary:bibasilar crackles present, speaking in full sentences, no acute respiratory distress  Abdominal:soft, non-tender Neuro:awake, alert  UEA:[id number] edema on R stump, no LLE edema appreciated  Skin:warm and dry  Psych: frustrated   Assessment/Plan:  Principal Problem:   Acute hypoxic respiratory failure (HCC) Active Problems:   Insulin dependent type 2 diabetes mellitus, controlled (HCC)   Morbid obesity (HCC)   CKD (chronic kidney disease) stage 4, GFR 15-29 ml/min (HCC)   Unilateral AKA, right (HCC)   History of pulmonary embolus (PE)   Acute on chronic diastolic heart failure (HCC)   OSA (obstructive sleep apnea)  Acute hypoxic respiratory failure Acute on Chronic diastolic heart failure Patient is still volume overloaded today with bibasilar crackles and lower extremity edema. He remains hemodynamically stable and his symptoms are improving. He diuresed 3.1L.  Plan: -Wean O2  as tolerated  -Patient cannot ambulate, but will PT eval and monitor O2 use -Continue IV lasix 80mg  today -Daily weights -Strict ins and outs  -Monitor K, Mg, renal function  ** Patient will be d/c home on 80 mg lasix BID when medically ready for discharge**   T2DM Patient had a low of 47 this morning that improved with oral intake. Hypoglycemia most likely due to decrease in steroid dosing. Will continue to adjust as indicated  Plan: -Continue 54 units basal insulin -Decrease meal time insulin to 7 units TID -SSI  Gout Inflammatory arthropathy, R wrist Change in prednisone taper due to mood disturbances that have now resolved Prednisone taper - 20 mg 3/5 -10 mg 3/6 - 3/7  Chronic medical conditions CKD 4: BL Cr 3.6. Stable on admission. Avoid nephrotoxic agents  Hx PEDVT: Eliquis 5 mg BID HTN: continued on PTA antihypertensives: amlodipine, carverdilol 25 BID, Hydralazine 100 mg TID. Need to confirm he has clonidine patch HLD: lovastatin 20 mg alternative Overactive bladderBPH: Oxybutynin and Tamsulosin. Bladder scan q shift x4 GERD: Protonix equivalent Constipation: scheduled Miralax and PRN Senna S  Resolved Problems:  __________________________________   Dispo: Anticipated discharge in approximately 1-2 day(s).   Faith Rogue, DO 03/14/2023, 11:57 AM Pager: [phone] After 5pm on weekdays and 1pm on weekends: On Call pager [phone]

## 2023-03-14 NOTE — Inpatient Diabetes Management (Signed)
 Inpatient Diabetes Program Recommendations  AACE/ADA: New Consensus Statement on Inpatient Glycemic Control (2015)  Target Ranges:  Prepandial:   less than 140 mg/dL      Peak postprandial:   less than 180 mg/dL (1-2 hours)      Critically ill patients:  140 - 180 mg/dL   Lab Results  Component Value Date   GLUCAP 156 (H) 03/14/2023   HGBA1C 5.2 12/06/2022    Review of Glycemic Control  Latest Reference Range & Units 03/14/23 02:44 03/14/23 08:16 03/14/23 09:22 03/14/23 12:27  Glucose-Capillary 70 - 99 mg/dL 72 47 (L) 99 161 (H)  (L): Data is abnormally low (H): Data is abnormally high Diabetes history: Type 2 DM Outpatient Diabetes medications: Mounjaro 15 mg qwk, Fiasp 10 units TID, Tresiba 54 units QA Current orders for Inpatient glycemic control: Novolog 7 units TID, Lantus 54 units every day, Novolog 0-20 units TID  & HS Prednisone 10 mg QD  Inpatient Diabetes Program Recommendations:   Noted hypoglycemia this AM of 47 mg/dL. Consider reducing Lantus to 28 units every day.   Thanks, Lujean Rave, MSN, RNC-OB Diabetes Coordinator [phone] (8a-5p)

## 2023-03-14 NOTE — Progress Notes (Signed)
  Echocardiogram 2D Echocardiogram has been performed.  Leda Roys RDCS 03/14/2023, 2:24 PM

## 2023-03-15 DIAGNOSIS — I5041 Acute combined systolic (congestive) and diastolic (congestive) heart failure: Secondary | ICD-10-CM

## 2023-03-15 DIAGNOSIS — Z794 Long term (current) use of insulin: Secondary | ICD-10-CM | POA: Diagnosis not present

## 2023-03-15 DIAGNOSIS — E119 Type 2 diabetes mellitus without complications: Secondary | ICD-10-CM

## 2023-03-15 DIAGNOSIS — J9601 Acute respiratory failure with hypoxia: Secondary | ICD-10-CM | POA: Diagnosis not present

## 2023-03-15 LAB — CBC
HCT: 27.8 % — ABNORMAL LOW (ref 39.0–52.0)
Hemoglobin: 8.9 g/dL — ABNORMAL LOW (ref 13.0–17.0)
MCH: 29.8 pg (ref 26.0–34.0)
MCHC: 32 g/dL (ref 30.0–36.0)
MCV: 93 fL (ref 80.0–100.0)
Platelets: 143 10*3/uL — ABNORMAL LOW (ref 150–400)
RBC: 2.99 MIL/uL — ABNORMAL LOW (ref 4.22–5.81)
RDW: 18.3 % — ABNORMAL HIGH (ref 11.5–15.5)
WBC: 7.2 10*3/uL (ref 4.0–10.5)
nRBC: 0 % (ref 0.0–0.2)

## 2023-03-15 LAB — RENAL FUNCTION PANEL
Albumin: 2.8 g/dL — ABNORMAL LOW (ref 3.5–5.0)
Anion gap: 12 (ref 5–15)
BUN: 82 mg/dL — ABNORMAL HIGH (ref 6–20)
CO2: 22 mmol/L (ref 22–32)
Calcium: 8.6 mg/dL — ABNORMAL LOW (ref 8.9–10.3)
Chloride: 107 mmol/L (ref 98–111)
Creatinine, Ser: 2.92 mg/dL — ABNORMAL HIGH (ref 0.61–1.24)
GFR, Estimated: 26 mL/min — ABNORMAL LOW (ref >60)
Glucose, Bld: 145 mg/dL — ABNORMAL HIGH (ref 70–99)
Phosphorus: 4.6 mg/dL (ref 2.5–4.6)
Potassium: 3.7 mmol/L (ref 3.5–5.1)
Sodium: 141 mmol/L (ref 135–145)

## 2023-03-15 LAB — MAGNESIUM: Magnesium: 1.8 mg/dL (ref 1.7–2.4)

## 2023-03-15 LAB — GLUCOSE, CAPILLARY
Glucose-Capillary: 214 mg/dL — ABNORMAL HIGH (ref 70–99)
Glucose-Capillary: 126 mg/dL — ABNORMAL HIGH (ref 70–99)
Glucose-Capillary: 129 mg/dL — ABNORMAL HIGH (ref 70–99)
Glucose-Capillary: 286 mg/dL — ABNORMAL HIGH (ref 70–99)

## 2023-03-15 MED ORDER — POTASSIUM CHLORIDE CRYS ER 20 MEQ PO TBCR
40.0000 meq | EXTENDED_RELEASE_TABLET | Freq: Once | ORAL | Status: AC
  Administered 2023-03-15: 40 meq via ORAL
  Filled 2023-03-15: qty 2

## 2023-03-15 MED ORDER — MAGNESIUM SULFATE 2 GM/50ML IV SOLN
2.0000 g | Freq: Once | INTRAVENOUS | Status: AC
  Administered 2023-03-15: 2 g via INTRAVENOUS
  Filled 2023-03-15: qty 50

## 2023-03-15 MED ORDER — FUROSEMIDE 40 MG PO TABS
80.0000 mg | ORAL_TABLET | Freq: Two times a day (BID) | ORAL | Status: DC
  Administered 2023-03-15 – 2023-03-16 (×3): 80 mg via ORAL
  Filled 2023-03-15 (×3): qty 2

## 2023-03-15 NOTE — Progress Notes (Signed)
 Mobility Specialist: Progress Note   03/15/23 1206  Mobility  Activity Dangled on edge of bed  Range of Motion/Exercises Right arm;Left arm  Activity Response Tolerated well  Mobility visit 1 Mobility  Mobility Specialist Start Time (ACUTE ONLY) 1145  Mobility Specialist Stop Time (ACUTE ONLY) 1150  Mobility Specialist Time Calculation (min) (ACUTE ONLY) 5 min    Pt received in bed. Declined all OOB mobility but agreed to practice theraband exercises. Sat EOB and performed exercises ind. Left on EOB performing exercise, all needs met, call bell in reach.   Maurene Capes Mobility Specialist Please contact via SecureChat or Rehab office at [phone]

## 2023-03-15 NOTE — Progress Notes (Signed)
 Subjective:  [name] is a [phone] with a prior medical history significant for chronic diastolic heart failure, OSA, CKD4, insulin dependent DM with BMI 40 c/b neuropathy, and gout arthropathy recently discharged from IMTS service on 2/17 after treatment for inflammatory arthropathy who presented to ED on 3/4 for shortness of breath and was admitted for a HFpEF exacerbation.   Today, patient is breathing better and starting to feel better. He still has lower leg swelling present and is not comfortable going home today. He denies wrist pain.   Objective:  Vital signs in last 24 hours: Vitals:   03/15/23 0425 03/15/23 0806 03/15/23 0843 03/15/23 0955  BP: (!) 141/60 (!) 127/59  (!) 152/65  Pulse: 86 90    Resp: 18 18    Temp: 98.9 F (37.2 C) 98.7 F (37.1 C)    TempSrc: Oral     SpO2: 100% 98% 97%   Weight: (!) 154.8 kg     Height:       Physical Exam: General:NAD, sitting in bed  Cardiac:RRR, no murmur  Pulmonary:clear to auscultate bilaterally, normal effort on room air  Neuro:awake, alert, participating in conversation  XBJ:[id number] sock on the LLE, difficult to assess for edema today due to compression Skin:warm and dry Psych:  normal mood and affect      Latest Ref Rng & Units 03/15/2023    6:58 AM 03/14/2023    7:03 AM 03/13/2023    5:34 PM  CBC  WBC 4.0 - 10.5 K/uL 7.2  9.1    Hemoglobin 13.0 - 17.0 g/dL 8.9  8.6  9.9   Hematocrit 39.0 - 52.0 % 27.8  27.3  29.0   Platelets 150 - 400 K/uL 143  145         Latest Ref Rng & Units 03/15/2023    6:58 AM 03/14/2023    7:03 AM 03/13/2023    5:34 PM  BMP  Glucose 70 - 99 mg/dL 865  70    BUN 6 - 20 mg/dL 82  72    Creatinine 7.84 - 1.24 mg/dL 6.96  2.95    Sodium 284 - 145 mmol/L 141  139  141   Potassium 3.5 - 5.1 mmol/L 3.7  3.4  4.2   Chloride 98 - 111 mmol/L 107  110    CO2 22 - 32 mmol/L 22  23    Calcium 8.9 - 10.3 mg/dL 8.6  8.4       Assessment/Plan:  Principal Problem:   Acute hypoxic respiratory  failure (HCC) Active Problems:   Insulin dependent type 2 diabetes mellitus, controlled (HCC)   Morbid obesity (HCC)   CKD (chronic kidney disease) stage 4, GFR 15-29 ml/min (HCC)   Unilateral AKA, right (HCC)   History of pulmonary embolus (PE)   Acute on chronic diastolic heart failure (HCC)   OSA (obstructive sleep apnea)  Acute hypoxic respiratory failure Acute on Chronic diastolic heart failure Patient appears euvolemic today. He remains hemodynamically stable and his symptoms are improving. Plan: -Wean O2 as tolerated  -Patient cannot ambulate, but will PT eval and monitor O2 use -Start oral lasix 80mg  today -Daily weights -Strict ins and outs  -Monitor K, Mg, renal function  ** Patient will be d/c home on 80 mg lasix BID when medically ready for discharge**  -Tentatively plan for discharge tomorrow morning   T2DM Patient had a low of 47 during current hospitalization. Hypoglycemia most likely due to decrease in steroid dosing. Will continue to adjust  as indicated  Plan: -Continue 54 units basal insulin -Meal time insulin to 7 units TID -SSI  Gout Inflammatory arthropathy, R wrist Change in prednisone taper due to mood disturbances that have now resolved Prednisone taper: -10 mg 3/6 - 3/7  Chronic medical conditions CKD 4: BL Cr 3.6. Stable on admission. Avoid nephrotoxic agents  Hx PEDVT: Eliquis 5 mg BID HTN: continued on PTA antihypertensives: amlodipine, carverdilol 25 BID, Hydralazine 100 mg TID. Need to confirm he has clonidine patch HLD: lovastatin 20 mg alternative Overactive bladderBPH: Oxybutynin and Tamsulosin. Bladder scan q shift x4 GERD: Protonix equivalent Constipation: scheduled Miralax and PRN Senna S  Resolved Problems:  __________________________________   Dispo: Anticipated discharge in approximately 1 day(s).   Faith Rogue, DO 03/15/2023, 11:05 AM Pager: [phone] After 5pm on weekdays and 1pm on weekends: On Call pager [phone]

## 2023-03-15 NOTE — Plan of Care (Signed)

## 2023-03-15 NOTE — Plan of Care (Signed)
   Problem: Education: Goal: Ability to describe self-care measures that may prevent or decrease complications (Diabetes Survival Skills Education) will improve Outcome: Progressing Goal: Individualized Educational Video(s) Outcome: Progressing   Problem: Coping: Goal: Ability to adjust to condition or change in health will improve Outcome: Progressing   Problem: Fluid Volume: Goal: Ability to maintain a balanced intake and output will improve Outcome: Progressing   Problem: Health Behavior/Discharge Planning: Goal: Ability to identify and utilize available resources and services will improve Outcome: Progressing Goal: Ability to manage health-related needs will improve Outcome: Progressing   Problem: Metabolic: Goal: Ability to maintain appropriate glucose levels will improve Outcome: Progressing   Problem: Nutritional: Goal: Maintenance of adequate nutrition will improve Outcome: Progressing Goal: Progress toward achieving an optimal weight will improve Outcome: Progressing   Problem: Skin Integrity: Goal: Risk for impaired skin integrity will decrease Outcome: Progressing   Problem: Tissue Perfusion: Goal: Adequacy of tissue perfusion will improve Outcome: Progressing   Problem: Education: Goal: Knowledge of General Education information will improve Description: Including pain rating scale, medication(s)/side effects and non-pharmacologic comfort measures Outcome: Progressing   Problem: Health Behavior/Discharge Planning: Goal: Ability to manage health-related needs will improve Outcome: Progressing   Problem: Clinical Measurements: Goal: Ability to maintain clinical measurements within normal limits will improve Outcome: Progressing Goal: Will remain free from infection Outcome: Progressing Goal: Diagnostic test results will improve Outcome: Progressing Goal: Respiratory complications will improve Outcome: Progressing Goal: Cardiovascular complication will  be avoided Outcome: Progressing   Problem: Activity: Goal: Risk for activity intolerance will decrease Outcome: Progressing   Problem: Nutrition: Goal: Adequate nutrition will be maintained Outcome: Progressing   Problem: Coping: Goal: Level of anxiety will decrease Outcome: Progressing   Problem: Elimination: Goal: Will not experience complications related to bowel motility Outcome: Progressing Goal: Will not experience complications related to urinary retention Outcome: Progressing   Problem: Pain Managment: Goal: General experience of comfort will improve and/or be controlled Outcome: Progressing   Problem: Safety: Goal: Ability to remain free from injury will improve Outcome: Progressing   Problem: Skin Integrity: Goal: Risk for impaired skin integrity will decrease Outcome: Progressing   Problem: Education: Goal: Knowledge of General Education information will improve Description: Including pain rating scale, medication(s)/side effects and non-pharmacologic comfort measures Outcome: Progressing   Problem: Health Behavior/Discharge Planning: Goal: Ability to manage health-related needs will improve Outcome: Progressing   Problem: Clinical Measurements: Goal: Ability to maintain clinical measurements within normal limits will improve Outcome: Progressing Goal: Will remain free from infection Outcome: Progressing Goal: Diagnostic test results will improve Outcome: Progressing Goal: Respiratory complications will improve Outcome: Progressing Goal: Cardiovascular complication will be avoided Outcome: Progressing   Problem: Activity: Goal: Risk for activity intolerance will decrease Outcome: Progressing   Problem: Nutrition: Goal: Adequate nutrition will be maintained Outcome: Progressing   Problem: Coping: Goal: Level of anxiety will decrease Outcome: Progressing   Problem: Elimination: Goal: Will not experience complications related to bowel  motility Outcome: Progressing Goal: Will not experience complications related to urinary retention Outcome: Progressing   Problem: Pain Managment: Goal: General experience of comfort will improve and/or be controlled Outcome: Progressing   Problem: Safety: Goal: Ability to remain free from injury will improve Outcome: Progressing   Problem: Skin Integrity: Goal: Risk for impaired skin integrity will decrease Outcome: Progressing

## 2023-03-16 ENCOUNTER — Other Ambulatory Visit (HOSPITAL_COMMUNITY): Payer: Self-pay

## 2023-03-16 DIAGNOSIS — J9601 Acute respiratory failure with hypoxia: Secondary | ICD-10-CM | POA: Diagnosis not present

## 2023-03-16 DIAGNOSIS — Z794 Long term (current) use of insulin: Secondary | ICD-10-CM | POA: Diagnosis not present

## 2023-03-16 DIAGNOSIS — I5041 Acute combined systolic (congestive) and diastolic (congestive) heart failure: Secondary | ICD-10-CM | POA: Diagnosis not present

## 2023-03-16 DIAGNOSIS — E119 Type 2 diabetes mellitus without complications: Secondary | ICD-10-CM | POA: Diagnosis not present

## 2023-03-16 LAB — RENAL FUNCTION PANEL
Albumin: 2.8 g/dL — ABNORMAL LOW (ref 3.5–5.0)
Anion gap: 6 (ref 5–15)
BUN: 78 mg/dL — ABNORMAL HIGH (ref 6–20)
CO2: 25 mmol/L (ref 22–32)
Calcium: 8.1 mg/dL — ABNORMAL LOW (ref 8.9–10.3)
Chloride: 110 mmol/L (ref 98–111)
Creatinine, Ser: 2.5 mg/dL — ABNORMAL HIGH (ref 0.61–1.24)
GFR, Estimated: 31 mL/min — ABNORMAL LOW (ref >60)
Glucose, Bld: 72 mg/dL (ref 70–99)
Phosphorus: 4.2 mg/dL (ref 2.5–4.6)
Potassium: 3.3 mmol/L — ABNORMAL LOW (ref 3.5–5.1)
Sodium: 141 mmol/L (ref 135–145)

## 2023-03-16 LAB — GLUCOSE, CAPILLARY
Glucose-Capillary: 132 mg/dL — ABNORMAL HIGH (ref 70–99)
Glucose-Capillary: 39 mg/dL — CL (ref 70–99)
Glucose-Capillary: 50 mg/dL — ABNORMAL LOW (ref 70–99)
Glucose-Capillary: 80 mg/dL (ref 70–99)

## 2023-03-16 LAB — MAGNESIUM: Magnesium: 2 mg/dL (ref 1.7–2.4)

## 2023-03-16 MED ORDER — TRESIBA FLEXTOUCH 200 UNIT/ML ~~LOC~~ SOPN
50.0000 [IU] | PEN_INJECTOR | SUBCUTANEOUS | 0 refills | Status: DC
  Filled 2023-03-16: qty 15, 60d supply, fill #0

## 2023-03-16 MED ORDER — POTASSIUM CHLORIDE CRYS ER 20 MEQ PO TBCR
40.0000 meq | EXTENDED_RELEASE_TABLET | Freq: Once | ORAL | Status: AC
  Administered 2023-03-16: 40 meq via ORAL
  Filled 2023-03-16: qty 2

## 2023-03-16 MED ORDER — FUROSEMIDE 80 MG PO TABS
80.0000 mg | ORAL_TABLET | Freq: Two times a day (BID) | ORAL | 1 refills | Status: AC
Start: 2023-03-16 — End: ?
  Filled 2023-03-16: qty 30, 15d supply, fill #0

## 2023-03-16 MED ORDER — NOVOLOG FLEXPEN 100 UNIT/ML ~~LOC~~ SOPN
0.0000 [IU] | PEN_INJECTOR | Freq: Three times a day (TID) | SUBCUTANEOUS | 4 refills | Status: DC
Start: 2023-03-16 — End: 2023-10-25
  Filled 2023-03-16: qty 9, 25d supply, fill #0

## 2023-03-16 NOTE — Inpatient Diabetes Management (Signed)
 Inpatient Diabetes Program Recommendations  AACE/ADA: New Consensus Statement on Inpatient Glycemic Control (2015)  Target Ranges:  Prepandial:   less than 140 mg/dL      Peak postprandial:   less than 180 mg/dL (1-2 hours)      Critically ill patients:  140 - 180 mg/dL   Lab Results  Component Value Date   GLUCAP 39 (LL) 03/16/2023   HGBA1C 5.2 12/06/2022    Review of Glycemic Control  Latest Reference Range & Units 03/15/23 08:05 03/15/23 12:13 03/15/23 16:57 03/15/23 21:26 03/16/23 08:11 03/16/23 11:14  Glucose-Capillary 70 - 99 mg/dL 829 (H) 562 (H) 130 (H) 286 (H) 132 (H) 39 (LL)   Diabetes history: DM 2 Outpatient Diabetes medications:  Fiasp 10 units with breakfast, lunch and dinner Tresiba 34 units daily Current orders for Inpatient glycemic control:  Novolog 0-20 units tid with meals and HS Lantus 54 units daily  Inpatient Diabetes Program Recommendations:    Please consider reducing Lantus to 40 units daily.  Also consider reducing Novolog correction to sensitive (0-9 units tid with meals) and HS.  Thanks,  Lorenza Cambridge, RN, BC-ADM Inpatient Diabetes Coordinator Pager [phone]  (8a-5p)

## 2023-03-16 NOTE — Discharge Instructions (Addendum)
 You came to the hospital for volume overload and you were diagnosed with a heart failure exacerbation.  We treated you with IV lasix and then with oral lasix.    *For your Heart failure -We will continue your home lasix regimen of: Lasix 80 mg twice a day -We have restarted your home potassium of three times a day (total of ) every day, this will help keep your potassium at a normal level while using lasix.   *For your diabetes we will decrease the Tresiba to 50 units daily, please keep track of your morning blood sugar levels before you eat.   *We have increased the scale of your sliding scale insulin to 0-10 units: Please see the glucose levels below and how many units of sliding scale insulin to inject. If you have any questions about how to do this, please call our clinic as soon as possible.   Glucose levels:             Inject __ units of insulin: 141-180                        4 units 181-220                        6 units 221-260                        8 units 261-300                        10 units If glucose if greater than 300, please inject 12 units and call your PCP and/or endocrinologist office   We have discontinued the aspart insulin ( insulin aspart (FIASP) 100 UNIT/ML FlexTouch Pen), you will NO longer take 10 units of insulin with every meal. You will use the sliding scale insulin instead.   If your fasting blood glucose in the morning remains elevated (greater than 300mg  every morning) please give the clinic a call and your endocrinologist office for advice on changing your insulin regimen. If your fasting blood glucose level or any blood glucose reading throughout the day is below 70, please call the either our clinic or the endocrinologist office   Call and schedule an appoint with your endocriniologist within the next month   If you have any questions or concerns please feel free to call: Internal medicine clinic at [phone]  Please attend your hospital  follow up 03/21/2023 at 9:15   If you have any of these following symptoms, please call us or seek care at an emergency department: -Chest Pain -Difficulty Breathing -Syncope (passing out) -Drooping of face -Slurred speech -Sudden weakness in your leg or arm -Fever -Chills -Gain more than 3 pounds in one day, more than  5 pounds in one week, or shortness of breath or worsening lower leg edema, call us.    We are glad that you are feeling better, it was a pleasure to care for you!  Faith Rogue DO

## 2023-03-16 NOTE — Care Management Important Message (Signed)
 Important Message  Patient Details  Name: [name] MRN: [id number] Date of Birth: 11-06-1975   Important Message Given:  Yes - Medicare IM     Dorena Bodo 03/16/2023, 2:38 PM

## 2023-03-16 NOTE — Progress Notes (Addendum)
 AVS printed and reviewed with patient questions answered.  Meds delivered bedside to patient.  Ivs removed and pressure applied.  Waiting for Compression sock for stump is all that needs to be delivered.    Floor to continue with discharge.

## 2023-03-16 NOTE — Progress Notes (Signed)
 Orthopedic Tech Progress Note Patient Details:  [name] [birth year] [id number] Called in order to [facility] for shrinker Patient ID: [name], male   DOB: 02-24-1975, 48 y.o.   MRN: [id number]  [name] 03/16/2023, 11:57 AM

## 2023-03-16 NOTE — Plan of Care (Signed)
   Problem: Education: Goal: Ability to describe self-care measures that may prevent or decrease complications (Diabetes Survival Skills Education) will improve Outcome: Progressing Goal: Individualized Educational Video(s) Outcome: Progressing   Problem: Coping: Goal: Ability to adjust to condition or change in health will improve Outcome: Progressing   Problem: Fluid Volume: Goal: Ability to maintain a balanced intake and output will improve Outcome: Progressing   Problem: Health Behavior/Discharge Planning: Goal: Ability to identify and utilize available resources and services will improve Outcome: Progressing Goal: Ability to manage health-related needs will improve Outcome: Progressing   Problem: Metabolic: Goal: Ability to maintain appropriate glucose levels will improve Outcome: Progressing   Problem: Nutritional: Goal: Maintenance of adequate nutrition will improve Outcome: Progressing Goal: Progress toward achieving an optimal weight will improve Outcome: Progressing   Problem: Skin Integrity: Goal: Risk for impaired skin integrity will decrease Outcome: Progressing   Problem: Tissue Perfusion: Goal: Adequacy of tissue perfusion will improve Outcome: Progressing   Problem: Education: Goal: Knowledge of General Education information will improve Description: Including pain rating scale, medication(s)/side effects and non-pharmacologic comfort measures Outcome: Progressing   Problem: Health Behavior/Discharge Planning: Goal: Ability to manage health-related needs will improve Outcome: Progressing   Problem: Clinical Measurements: Goal: Ability to maintain clinical measurements within normal limits will improve Outcome: Progressing Goal: Will remain free from infection Outcome: Progressing Goal: Diagnostic test results will improve Outcome: Progressing Goal: Respiratory complications will improve Outcome: Progressing Goal: Cardiovascular complication will  be avoided Outcome: Progressing   Problem: Activity: Goal: Risk for activity intolerance will decrease Outcome: Progressing   Problem: Nutrition: Goal: Adequate nutrition will be maintained Outcome: Progressing   Problem: Coping: Goal: Level of anxiety will decrease Outcome: Progressing   Problem: Elimination: Goal: Will not experience complications related to bowel motility Outcome: Progressing Goal: Will not experience complications related to urinary retention Outcome: Progressing   Problem: Pain Managment: Goal: General experience of comfort will improve and/or be controlled Outcome: Progressing   Problem: Safety: Goal: Ability to remain free from injury will improve Outcome: Progressing   Problem: Skin Integrity: Goal: Risk for impaired skin integrity will decrease Outcome: Progressing   Problem: Education: Goal: Knowledge of General Education information will improve Description: Including pain rating scale, medication(s)/side effects and non-pharmacologic comfort measures Outcome: Progressing   Problem: Health Behavior/Discharge Planning: Goal: Ability to manage health-related needs will improve Outcome: Progressing   Problem: Clinical Measurements: Goal: Ability to maintain clinical measurements within normal limits will improve Outcome: Progressing Goal: Will remain free from infection Outcome: Progressing Goal: Diagnostic test results will improve Outcome: Progressing Goal: Respiratory complications will improve Outcome: Progressing Goal: Cardiovascular complication will be avoided Outcome: Progressing   Problem: Activity: Goal: Risk for activity intolerance will decrease Outcome: Progressing   Problem: Nutrition: Goal: Adequate nutrition will be maintained Outcome: Progressing   Problem: Coping: Goal: Level of anxiety will decrease Outcome: Progressing   Problem: Elimination: Goal: Will not experience complications related to bowel  motility Outcome: Progressing Goal: Will not experience complications related to urinary retention Outcome: Progressing   Problem: Pain Managment: Goal: General experience of comfort will improve and/or be controlled Outcome: Progressing   Problem: Safety: Goal: Ability to remain free from injury will improve Outcome: Progressing   Problem: Skin Integrity: Goal: Risk for impaired skin integrity will decrease Outcome: Progressing

## 2023-03-16 NOTE — Discharge Summary (Signed)
 Name: [name] MRN: [id number] DOB: [birth year] 48 y.o. PCP: Victor Rung, DO  Date of Admission: 03/13/2023  3:20 PM Date of Discharge: 03/16/2023 1:32 PM Attending Physician: Dr. Antony Kelley  Discharge Diagnosis: Principal Problem:   Acute hypoxic respiratory failure (HCC) Active Problems:   Insulin dependent type 2 diabetes mellitus, controlled (HCC)   Morbid obesity (HCC)   CKD (chronic kidney disease) stage 4, GFR 15-29 ml/min (HCC)   Unilateral AKA, right (HCC)   History of pulmonary embolus (PE)   Acute on chronic diastolic heart failure (HCC)   OSA (obstructive sleep apnea)    Discharge Medications: Allergies as of 03/16/2023       Reactions   Reglan [metoclopramide] Other (See Comments)   Dysphoric reaction   Metformin And Related Diarrhea   Ozempic (0.25 Or 0.5 Mg-dose) [semaglutide(0.25 Or 0.5mg -dos)] Nausea And Vomiting   Thiazide-type Diuretics    Gout        Medication List     STOP taking these medications    Fiasp FlexTouch 100 UNIT/ML FlexTouch Pen Generic drug: insulin aspart   predniSONE 20 MG tablet Commonly known as: DELTASONE       TAKE these medications    Accu-Chek Guide Me w/Device Kit Use to check blood sugar up to 3 times a day   Accu-Chek Guide test strip Generic drug: glucose blood Check blood sugar up to 3 times a day   accu-chek softclix lancets Use to check blood sugars 5 times a day. Dx code:E11.9. Insulin dependent. What changed:  how much to take how to take this when to take this   acetaminophen 325 MG tablet Commonly known as: TYLENOL Take 2 tablets (650 mg total) by mouth every 6 (six) hours. What changed:  when to take this reasons to take this   amLODipine 10 MG tablet Commonly known as: NORVASC Take 10 mg by mouth at bedtime.   B-D UF III MINI PEN NEEDLES 31G X 5 MM Misc Generic drug: Insulin Pen Needle 1 each by Other route See admin instructions. USE 4-5 TIMES DAILY   B-D UF III MINI PEN  NEEDLES 31G X 5 MM Misc Generic drug: Insulin Pen Needle USE 1 NEEDLE FIVE TIMES DAILY   Insupen Pen Needles 32G X 4 MM Misc Generic drug: Insulin Pen Needle Use as directed   carvedilol 25 MG tablet Commonly known as: COREG TAKE 1 TABLET BY MOUTH TWICE DAILY WITH A MEAL   cloNIDine 0.1 mg/24hr patch Commonly known as: CATAPRES - Dosed in mg/24 hr 0.1 mg once a week.   diclofenac Sodium 1 % Gel Commonly known as: Voltaren Apply 2 g topically 4 (four) times daily. What changed:  when to take this reasons to take this   Eliquis 5 MG Tabs tablet Generic drug: apixaban Take 1 tablet by mouth twice daily   fluticasone 50 MCG/ACT nasal spray Commonly known as: Flonase Place 2 sprays into both nostrils daily. What changed:  when to take this reasons to take this   furosemide 80 MG tablet Commonly known as: LASIX Take 1 tablet (80 mg total) by mouth 2 (two) times daily.   gabapentin 100 MG capsule Commonly known as: NEURONTIN TAKE 1 CAPSULE BY MOUTH THREE TIMES DAILY What changed: when to take this   Gvoke HypoPen 1-Pack 1 MG/0.2ML Soaj Generic drug: Glucagon Inject 1 mg into the skin as needed (low blood sugar with impaired consciousness).   hydrALAZINE 100 MG tablet Commonly known as: APRESOLINE Take 100 mg by  mouth 3 (three) times daily.   lidocaine 4 % Commonly known as: HM Lidocaine Patch Place 1 patch onto the skin daily. What changed:  when to take this reasons to take this   lovastatin 20 MG tablet Commonly known as: MEVACOR Take 1 tablet by mouth once daily   NovoLOG FlexPen 100 UNIT/ML FlexPen Generic drug: insulin aspart Inject 0-10 Units into the skin 3 (three) times daily with meals. Glucose 141-180: inject 4 units, Glucose 181-220: inject 6 units, Glucose 221-260: inject 8 units, Glucose 261-300: inject 10 units, If glucose if greater than 300, please inject 12 units and call your PCP and/or endocrinologist office What changed:  how much to  take additional instructions   oxybutynin 5 MG tablet Commonly known as: DITROPAN TAKE 1 TABLET BY MOUTH THREE TIMES DAILY   pantoprazole 20 MG tablet Commonly known as: PROTONIX Take 1 tablet (20 mg total) by mouth daily.   polyethylene glycol 17 g packet Commonly known as: MIRALAX / GLYCOLAX Take 17 g by mouth 2 (two) times daily. What changed:  when to take this reasons to take this   potassium chloride SA 20 MEQ tablet Commonly known as: KLOR-CON M Take 20 mEq by mouth 3 (three) times daily.   senna-docusate 8.6-50 MG tablet Commonly known as: Senokot-S Take 1 tablet by mouth daily. What changed:  when to take this reasons to take this   tamsulosin 0.4 MG Caps capsule Commonly known as: FLOMAX Take 1 capsule by mouth at bedtime   tirzepatide 15 MG/0.5ML Pen Commonly known as: MOUNJARO Inject 15 mg into the skin once a week.   traMADol 50 MG tablet Commonly known as: ULTRAM Take 1 tablet (50 mg total) by mouth every 12 (twelve) hours as needed. What changed: reasons to take this   Tresiba FlexTouch 200 UNIT/ML FlexTouch Pen Generic drug: insulin degludec Inject 50 Units into the skin daily. What changed:  how much to take additional instructions        Disposition and follow-up:   [name] was discharged from [facility] in Good condition.  At the hospital follow up visit please address:  1.  Follow-up:  *HFrEF -Ensure patient is taking lasix 80 mg BID -Repeat BMP, patient is on potassium 60 meq daily, evaluate need to continue potassium   -Assess volume status   *T2DM *Hypoglycemia  -Patient's home regimen was changed on prior admission due to hyperglycemia with prednisone use. Patient finished prednisone regimen and experienced hypoglycemia here.   -Note changes in patient's regimen:  -Tresiba 50 units (was 54 units)  -SSI 0-10 units with meals  -D/c 10 units with meals  -Titrate as necessary  -Encourage follow up  with endocrinology    *CKD Stage IV -Repeat BMP at follow up   *R AKA -Assess if patient has "shrinker" for AKA to compress for prosthetic fitting: [facility] will deliver this    2.  Labs / imaging needed at time of follow-up: BMP  3.  Pending labs/ test needing follow-up: N/A  4.  Medication Changes  STOPPED  -Prednisone   -Aspart insulin ( insulin aspart (FIASP) 100 UNIT/ML FlexTouch Pen) 10 units with meals    ADDED  -Restarted home potassium  , daily ( TID)    MODIFIED  -Tresebia decreased to 50 units from 54 units   -Lasix increased to dose dose of 80 BID    -Increased SSI to 0-10 units with meals    Hospital Course by problem list:  Acute hypoxic respiratory failure Acute on Chronic diastolic heart failure Patient presented with acute hypoxic respiratory failure requiring supplemental oxygen use due to an acute HFpEF exacerbation most likely from decreasing home diuretic dose from 80mg   twice daily to 80mg   daily.  Patient diuresed well with IV Lasix and was transitioned to home Lasix dose of 80 mg twice daily.  An echocardiogram was obtained which showed an LVEF of 55 to 60%.  Patient had trace pitting edema of the right stump and left lower extremity on day of discharge.  He was not discharged home on supplemental oxygen.  Leukocytosis  Resolved, most likely from prednisone use.   Type 2 Diabetes mellitus Hypoglycemia  Diabetic neuropathy Patient had hypoglycemia twice throughout hospitalization that corrected with oral intake. Hypoglycemia was most likely due to decrease in prednisone and meal time insulin. His regimen was tresiba 50 units, SSI 0-10 units on the day of discharge. We d/c the 10 units of meal time insulin.   Gout Inflammatory arthropathy, R wrist Stable, patient finished prednisone taper here.   Chronic medical conditions CKD 4: BL Cr 3.6. Stable on admission. Avoid nephrotoxic agents  Hx PEDVT: Eliquis 5 mg BID HTN: continued on  PTA antihypertensives: amlodipine, carverdilol 25 BID, Hydralazine 100 mg TID. Need to confirm he has clonidine patch HLD: lovastatin 20 mg alternative Overactive bladderBPH: Oxybutynin and Tamsulosin. Bladder scan q shift x4 GERD: Protonix equivalent Constipation: scheduled Miralax and PRN Senna S    Discharge Subjective: Patient is feeling well and is ready to go home. We spoke about the changes to his medications and he is aware that we are discontinuing the 10 units of insulin with every meal and using SSI instead. Patient is also aware of safety precautions and when to return.   Discharge Exam:   Blood pressure (!) 141/64, pulse 85, temperature 97.8 F (36.6 C), temperature source Oral, resp. rate 18, height 6\' 2"  (1.88 m), weight (!) 155.4 kg, SpO2 97%.  Constitutional:Well-appearing, sitting in bed, in no acute distress Cardiovascular: regular rate and rhythm, no m/r/g Pulmonary/Chest: normal work of breathing on room air, lungs clear to auscultation bilaterally. No crackles  Neurological: alert & awake, participating in conversation  MSK: R AKA, Trace pitting edema Skin: warm and dry Psych: Normal mood and affect  Pertinent Labs, Studies, and Procedures:     Latest Ref Rng & Units 03/15/2023    6:58 AM 03/14/2023    7:03 AM 03/13/2023    5:34 PM  CBC  WBC 4.0 - 10.5 K/uL 7.2  9.1    Hemoglobin 13.0 - 17.0 g/dL 8.9  8.6  9.9   Hematocrit 39.0 - 52.0 % 27.8  27.3  29.0   Platelets 150 - 400 K/uL 143  145         Latest Ref Rng & Units 03/16/2023    7:33 AM 03/15/2023    6:58 AM 03/14/2023    7:03 AM  CMP  Glucose 70 - 99 mg/dL 72  098  70   BUN 6 - 20 mg/dL 78  82  72   Creatinine 0.61 - 1.24 mg/dL 1.19  1.47  8.29   Sodium 135 - 145 mmol/L 141  141  139   Potassium 3.5 - 5.1 mmol/L 3.3  3.7  3.4   Chloride 98 - 111 mmol/L 110  107  110   CO2 22 - 32 mmol/L 25  22  23    Calcium 8.9 - 10.3 mg/dL 8.1  8.6  8.4  ECHOCARDIOGRAM COMPLETE Result Date: 03/14/2023     ECHOCARDIOGRAM REPORT   Patient Name:   [name] Date of Exam: 03/14/2023 Medical Rec #:  [id number]     Height:       74.0 in Accession #:    [id number]    Weight:       315.0 lb Date of Birth:  [birth year]     BSA:          2.636 m Patient Age:    47 years      BP:           142/74 mmHg Patient Gender: M             HR:           91 bpm. Exam Location:  Inpatient Procedure: 2D Echo, Color Doppler and Cardiac Doppler (Both Spectral and Color            Flow Doppler were utilized during procedure). Indications:    Dyspnea R06.00  History:        Patient has prior history of Echocardiogram examinations, most                 recent 10/01/2020.  Sonographer:    Harriette Bouillon RDCS Referring Phys: [id number] CAROLYN GUILLOUD IMPRESSIONS  1. Strain and 3D EF not performed. Left ventricular ejection fraction, by estimation, is 55 to 60%. The left ventricle has normal function. The left ventricle has no regional wall motion abnormalities. There is mild asymmetric left ventricular hypertrophy of the basal and septal segments. Left ventricular diastolic parameters were normal.  2. Right ventricular systolic function is normal. The right ventricular size is normal.  3. Left atrial size was moderately dilated.  4. The mitral valve is abnormal. Trivial mitral valve regurgitation. No evidence of mitral stenosis. Moderate mitral annular calcification.  5. The aortic valve is tricuspid. Aortic valve regurgitation is not visualized. No aortic stenosis is present.  6. The inferior vena cava is normal in size with greater than 50% respiratory variability, suggesting right atrial pressure of 3 mmHg. FINDINGS  Left Ventricle: Strain and 3D EF not performed. Left ventricular ejection fraction, by estimation, is 55 to 60%. The left ventricle has normal function. The left ventricle has no regional wall motion abnormalities. Strain was performed and the global longitudinal strain is indeterminate. The left ventricular internal cavity size was  normal in size. There is mild asymmetric left ventricular hypertrophy of the basal and septal segments. Left ventricular diastolic parameters were normal. Right Ventricle: The right ventricular size is normal. No increase in right ventricular wall thickness. Right ventricular systolic function is normal. Left Atrium: Left atrial size was moderately dilated. Right Atrium: Right atrial size was normal in size. Pericardium: Trivial pericardial effusion is present. The pericardial effusion is posterior to the left ventricle. Mitral Valve: The mitral valve is abnormal. There is mild thickening of the mitral valve leaflet(s). There is mild calcification of the mitral valve leaflet(s). Moderate mitral annular calcification. Trivial mitral valve regurgitation. No evidence of mitral valve stenosis. Tricuspid Valve: The tricuspid valve is normal in structure. Tricuspid valve regurgitation is mild . No evidence of tricuspid stenosis. Aortic Valve: The aortic valve is tricuspid. Aortic valve regurgitation is not visualized. No aortic stenosis is present. Pulmonic Valve: The pulmonic valve was normal in structure. Pulmonic valve regurgitation is trivial. No evidence of pulmonic stenosis. Aorta: The aortic root is normal in size and structure. Venous: The inferior vena cava is normal in size with  greater than 50% respiratory variability, suggesting right atrial pressure of 3 mmHg. IAS/Shunts: No atrial level shunt detected by color flow Doppler. Additional Comments: 3D was performed not requiring image post processing on an independent workstation and was indeterminate.  LEFT VENTRICLE PLAX 2D LVIDd:         5.50 cm   Diastology LVIDs:         3.30 cm   LV e' medial:   6.96 cm/s LV PW:         1.20 cm   LV E/e' medial: 19.1 LV IVS:        1.20 cm LVOT diam:     2.50 cm LV SV:         73 LV SV Index:   28 LVOT Area:     4.91 cm  RIGHT VENTRICLE            IVC RV S prime:     9.03 cm/s  IVC diam: 1.90 cm TAPSE (M-mode): 1.9 cm  LEFT ATRIUM             Index LA diam:        5.40 cm 2.05 cm/m LA Vol (A2C):   70.9 ml 26.89 ml/m LA Vol (A4C):   71.8 ml 27.23 ml/m LA Biplane Vol: 78.2 ml 29.66 ml/m  AORTIC VALVE LVOT Vmax:   90.10 cm/s LVOT Vmean:  56.600 cm/s LVOT VTI:    0.148 m  AORTA Ao Root diam: 2.90 cm Ao Asc diam:  3.20 cm MITRAL VALVE MV Area (PHT): 5.23 cm     SHUNTS MV Decel Time: 145 msec     Systemic VTI:  0.15 m MV E velocity: 133.00 cm/s  Systemic Diam: 2.50 cm MV A velocity: 79.40 cm/s MV E/A ratio:  1.68 Charlton Haws MD Electronically signed by Charlton Haws MD Signature Date/Time: 03/14/2023/2:38:08 PM    Final    DG Chest 2 View Result Date: 03/13/2023 CLINICAL DATA:  Shortness of breath and fluid retention. EXAM: CHEST - 2 VIEW COMPARISON:  10/01/2020 FINDINGS: The heart size and mediastinal contours are within normal limits. Low lung volumes are noted. Both lungs are clear. The visualized skeletal structures are unremarkable. IMPRESSION: Low lung volumes. No active cardiopulmonary disease. Electronically Signed   By: Danae Orleans M.D.   On: 03/13/2023 18:08     Discharge Instructions:  Discharge Instructions     (HEART FAILURE PATIENTS) Call MD:  Anytime you have any of the following symptoms: 1) 3 pound weight gain in 24 hours or 5 pounds in 1 week 2) shortness of breath, with or without a dry hacking cough 3) swelling in the hands, feet or stomach 4) if you have to sleep on extra pillows at night in order to breathe.   Complete by: As directed    (HEART FAILURE PATIENTS) Call MD:  Anytime you have any of the following symptoms: 1) 3 pound weight gain in 24 hours or 5 pounds in 1 week 2) shortness of breath, with or without a dry hacking cough 3) swelling in the hands, feet or stomach 4) if you have to sleep on extra pillows at night in order to breathe.   Complete by: As directed    Call MD for:  difficulty breathing, headache or visual disturbances   Complete by: As directed    Call MD for:  difficulty  breathing, headache or visual disturbances   Complete by: As directed    Call MD for:  extreme fatigue  Complete by: As directed    Call MD for:  extreme fatigue   Complete by: As directed    Call MD for:  hives   Complete by: As directed    Call MD for:  hives   Complete by: As directed    Call MD for:  persistant dizziness or light-headedness   Complete by: As directed    Call MD for:  persistant dizziness or light-headedness   Complete by: As directed    Call MD for:  persistant nausea and vomiting   Complete by: As directed    Call MD for:  persistant nausea and vomiting   Complete by: As directed    Call MD for:  redness, tenderness, or signs of infection (pain, swelling, redness, odor or green/yellow discharge around incision site)   Complete by: As directed    Call MD for:  redness, tenderness, or signs of infection (pain, swelling, redness, odor or green/yellow discharge around incision site)   Complete by: As directed    Call MD for:  severe uncontrolled pain   Complete by: As directed    Call MD for:  severe uncontrolled pain   Complete by: As directed    Call MD for:  temperature >100.4   Complete by: As directed    Call MD for:  temperature >100.4   Complete by: As directed    Diet - low sodium heart healthy   Complete by: As directed    Discharge instructions   Complete by: As directed    You came to the hospital for volume overload and you were diagnosed with a heart failure exacerbation.  We treated you with IV lasix and then with oral lasix.    *For your Heart failure -We will continue your home lasix regimen of: Lasix 80 mg twice a day -We have restarted your home potassium of three times a day (total of ) every day, this will help keep your potassium at a normal level while using lasix.   *For your diabetes we will continue to use Tresiba 50 units daily, please keep track of your morning blood sugar levels before you eat.   *We have increased  the scale of your sliding scale insulin to 0-10 units: Please see the glucose levels below and how many units of sliding scale insulin to inject. If you have any questions about how to do this, please call our clinic as soon as possible.   Glucose levels:             Inject __ units of insulin: 141-180                        4 units 181-220                        6 units 221-260                        8 units 261-300                        10 units If glucose if greater than 300, please inject 12 units and call your PCP and/or endocrinologist office   We have discontinued the aspart insulin ( insulin aspart (FIASP) 100 UNIT/ML FlexTouch Pen), you will NO longer take 10 units of insulin with every meal. You will use the sliding scale insulin instead.  If your fasting blood glucose in the morning remains elevated (greater than 300mg  every morning) please give the clinic a call and your endocrinologist office for advice on changing your insulin regimen. If your fasting blood glucose level or any blood glucose reading throughout the day is below 70, please call the either our clinic or the endocrinologist office   Call and schedule an appoint with your endocriniologist within the next month  Please attend your hospital follow up 03/21/2023 at 9:15   If you have any questions or concerns please feel free to call: Internal medicine clinic at [phone]   If you have any of these following symptoms, please call us or seek care at an emergency department: -Chest Pain -Difficulty Breathing -Syncope (passing out) -Drooping of face -Slurred speech -Sudden weakness in your leg or arm -Fever -Chills -Gain more than 3 pounds in one day, more than  5 pounds in one week, or shortness of breath or worsening lower leg edema, call us.    We are glad that you are feeling better, it was a pleasure to care for you!  Faith Rogue DO   Discharge instructions   Complete by: As directed    You came to  the hospital for volume overload and you were diagnosed with a heart failure exacerbation.  We treated you with IV lasix and then with oral lasix.    *For your Heart failure -We will continue your home lasix regimen of: Lasix 80 mg twice a day -We have restarted your home potassium of three times a day (total of ) every day, this will help keep your potassium at a normal level while using lasix.   *For your diabetes we will decrease the Tresiba to 50 units daily, please keep track of your morning blood sugar levels before you eat.   *We have increased the scale of your sliding scale insulin to 0-10 units: Please see the glucose levels below and how many units of sliding scale insulin to inject. If you have any questions about how to do this, please call our clinic as soon as possible.   Glucose levels:             Inject __ units of insulin: 141-180                        4 units 181-220                        6 units 221-260                        8 units 261-300                        10 units If glucose if greater than 300, please inject 12 units and call your PCP and/or endocrinologist office   We have discontinued the aspart insulin ( insulin aspart (FIASP) 100 UNIT/ML FlexTouch Pen), you will NO longer take 10 units of insulin with every meal. You will use the sliding scale insulin instead.   If your fasting blood glucose in the morning remains elevated (greater than 300mg  every morning) please give the clinic a call and your endocrinologist office for advice on changing your insulin regimen. If your fasting blood glucose level or any blood glucose reading throughout the day is below 70, please call the either our clinic or the endocrinologist  office   Call and schedule an appoint with your endocriniologist within the next month   If you have any questions or concerns please feel free to call: Internal medicine clinic at [phone]  Please attend your hospital follow up  03/21/2023 at 9:15   If you have any of these following symptoms, please call us or seek care at an emergency department: -Chest Pain -Difficulty Breathing -Syncope (passing out) -Drooping of face -Slurred speech -Sudden weakness in your leg or arm -Fever -Chills -Gain more than 3 pounds in one day, more than  5 pounds in one week, or shortness of breath or worsening lower leg edema, call us.    We are glad that you are feeling better, it was a pleasure to care for you!  Faith Rogue DO   Increase activity slowly   Complete by: As directed    Increase activity slowly   Complete by: As directed        Signed: Faith Rogue DO [facility] - PGY1 Pager: [phone] 03/16/2023, 1:32 PM    Please contact the on call pager after 5 pm and on weekends at [phone].

## 2023-03-16 NOTE — Progress Notes (Signed)
 PT Cancellation Note  Patient Details Name: [name] MRN: [id number] DOB: 1975/09/17   Cancelled Treatment:    Reason Eval/Treat Not Completed: Patient declined, no reason specified (pt reports independence with transfers to [facility] and denied any need for therapy at this time, pending D/C with pt declining services)   Annaka Cleaver B Kayanna Mckillop 03/16/2023, 11:53 AM Merryl Hacker, PT Acute Rehabilitation Services Office: [phone]

## 2023-03-16 NOTE — Progress Notes (Signed)
 Heart Failure Navigator Progress Note  Assessed for Heart & Vascular TOC clinic readiness.  Patient does not meet criteria due to EF 554-60%, has a scheduled Internal Medicine appointment on 04/05/2023.   Navigator will sign off at this time.   Rhae Hammock, BSN, Scientist, clinical (histocompatibility and immunogenetics) Only

## 2023-03-16 NOTE — Progress Notes (Signed)
 PT Cancellation Note  Patient Details Name: Camil Wilhelmsen MRN: [id number] DOB: [birth year]   Cancelled Treatment:    Reason Eval/Treat Not Completed: Fatigue/lethargy limiting ability to participate (pt reports not sleeping all night and requests therapy attempt after 10am. will attempt as schedule allows)   Miciah Shealy B Aveya Beal 03/16/2023, 8:37 AM Merryl Hacker, PT Acute Rehabilitation Services Office: [phone]

## 2023-03-19 ENCOUNTER — Ambulatory Visit: Payer: Self-pay | Admitting: Internal Medicine

## 2023-03-19 ENCOUNTER — Telehealth: Payer: Self-pay

## 2023-03-19 NOTE — Transitions of Care (Post Inpatient/ED Visit) (Signed)
 03/19/2023  Name: Janet Decesare MRN: [id number] DOB: [birth year]  Today's TOC FU Call Status: Today's TOC FU Call Status:: Successful TOC FU Call Completed TOC FU Call Complete Date: 03/19/23 Patient's Name and Date of Birth confirmed.  Transition Care Management Follow-up Telephone Call Date of Discharge: 03/16/23 Discharge Facility: [facility]) Type of Discharge: Inpatient Admission Primary Inpatient Discharge Diagnosis:: Acute hypoxic respiratory failure How have you been since you were released from the hospital?: Better Any questions or concerns?: Yes Patient Questions/Concerns:: Patient reports dry cough that is impacting his sleep started yesterday Patient Questions/Concerns Addressed: Notified Provider of Patient Questions/Concerns (Patient states he has 03/21/23 Hospital follow up- called PCP office in conference call requesting something for dry cough)  Items Reviewed: Did you receive and understand the discharge instructions provided?: Yes Medications obtained,verified, and reconciled?: Yes (Medications Reviewed) Any new allergies since your discharge?: No Dietary orders reviewed?: Yes Type of Diet Ordered:: low sodium heart healthy Do you have support at home?: Yes Name of Support/Comfort Primary Source: Has home health aide 5 days a week 7am - 3pm, family is nearby  Medications Reviewed Today: Medications Reviewed Today     Reviewed by Jessy Oto, RN (Registered Nurse) on 03/19/23 at 1001  Med List Status: <None>   Medication Order Taking? Sig Documenting Provider Last Dose Status Informant  acetaminophen (TYLENOL) 325 MG tablet [id number] Yes Take 2 tablets (650 mg total) by mouth every 6 (six) hours.  Patient taking differently: Take 650 mg by mouth every 6 (six) hours as needed for mild pain (pain score 1-3).   Faith Rogue, DO Taking Active Self  amLODipine (NORVASC) 10 MG tablet [id number] Yes Take 10 mg by mouth at bedtime. [provider]  Taking Active Self  B-D UF III MINI PEN NEEDLES 31G X 5 MM MISC [id number] Yes USE 1 NEEDLE FIVE TIMES DAILY Kathleen Lime, MD Taking Active Self  Blood Glucose Monitoring Suppl (ACCU-CHEK GUIDE ME) w/Device KIT [id number] Yes Use to check blood sugar up to 3 times a day Reather Littler, MD Taking Active Self  carvedilol (COREG) 25 MG tablet [id number] Yes TAKE 1 TABLET BY MOUTH TWICE DAILY WITH A MEAL Kathleen Lime, MD Taking Active Self  cloNIDine (CATAPRES - DOSED IN MG/24 HR) 0.1 mg/24hr patch [id number] Yes 0.1 mg once a week. [provider] Taking Active Self           Med Note (SATTERFIELD, DARIUS E   Wed Mar 14, 2023  3:17 PM) Usually apply on Wednesdays per patient   diclofenac Sodium (VOLTAREN) 1 % GEL [id number] Yes Apply 2 g topically 4 (four) times daily.  Patient taking differently: Apply 2 g topically daily as needed (for pain).   Dolan Amen, MD Taking Active Self  ELIQUIS 5 MG TABS tablet [id number] Yes Take 1 tablet by mouth twice daily Kathleen Lime, MD Taking Active Self  fluticasone (FLONASE) 50 MCG/ACT nasal spray [id number] Yes Place 2 sprays into both nostrils daily.  Patient taking differently: Place 2 sprays into both nostrils daily as needed for allergies.   Dolan Amen, MD Taking Active Self  furosemide (LASIX) 80 MG tablet [id number] Yes Take 1 tablet (80 mg total) by mouth 2 (two) times daily. Faith Rogue, DO Taking Active   gabapentin (NEURONTIN) 100 MG capsule [id number] Yes TAKE 1 CAPSULE BY MOUTH THREE TIMES DAILY  Patient taking differently: Take 100 mg by mouth 2 (two) times daily.   Kathleen Lime, MD Taking Active Self  Glucagon (GVOKE HYPOPEN  1-PACK) 1 MG/0.2ML SOAJ [id number] Yes Inject 1 mg into the skin as needed (low blood sugar with impaired consciousness). Thapa, [place], MD Taking Active Self  glucose blood (ACCU-CHEK GUIDE) test strip [id number] Yes Check blood sugar up to 3 times a day Reather Littler, MD Taking Active Self  hydrALAZINE  (APRESOLINE) 100 MG tablet [id number] Yes Take 100 mg by mouth 3 (three) times daily. [provider] Taking Active Self  insulin degludec (TRESIBA FLEXTOUCH) 200 UNIT/ML FlexTouch Pen [id number] Yes Inject 50 Units into the skin daily. Faith Rogue, DO Taking Active   Insulin Pen Needle (B-D UF III MINI PEN NEEDLES) 31G X 5 MM MISC [id number] Yes 1 each by Other route See admin instructions. USE 4-5 TIMES DAILY Roylene Reason, MD Taking Active Self  Insulin Pen Needle (INSUPEN PEN NEEDLES) 32G X 4 MM MISC [id number] Yes Use as directed Faith Rogue, DO Taking Active Self  Lancet Devices [facility]) lancets [id number] Yes Use to check blood sugars 5 times a day. Dx code:E11.9. Insulin dependent.  Patient taking differently: 1 each by Other route See admin instructions. Use to check blood sugars 5 times a day. Dx code:E11.9. Insulin dependent.   Camelia Phenes, DO Taking Active Self  lidocaine (HM LIDOCAINE PATCH) 4 % [id number] No Place 1 patch onto the skin daily.  Patient not taking: Reported on 03/19/2023   Adron Bene, MD Not Taking Consider Medication Status and Discontinue (No longer needed (for PRN medications)) Self  lovastatin (MEVACOR) 20 MG tablet [id number] Yes Take 1 tablet by mouth once daily Kathleen Lime, MD Taking Active Self  NOVOLOG FLEXPEN 100 UNIT/ML FlexPen [id number] Yes Inject 0-10 Units into the skin 3 (three) times daily with meals. Glucose 141-180: inject 4 units, Glucose 181-220: inject 6 units, Glucose 221-260: inject 8 units, Glucose 261-300: inject 10 units, If glucose if greater than 300, please inject 12 units and call your PCP and/or endocrinologist office Faith Rogue, DO Taking Active   oxybutynin (DITROPAN) 5 MG tablet [id number] Yes TAKE 1 TABLET BY MOUTH THREE TIMES DAILY Kathleen Lime, MD Taking Active Self  pantoprazole (PROTONIX) 20 MG tablet [id number] No Take 1 tablet (20 mg total) by mouth daily.  Patient not taking: Reported on  03/19/2023   Faith Rogue, DO Not Taking Active Self           Med Note [facility], Silvio Sausedo A   Mon Mar 19, 2023 10:00 AM) Patient states he does not have this medication and states he does not have reflux  polyethylene glycol (MIRALAX / GLYCOLAX) 17 g packet [id number] Yes Take 17 g by mouth 2 (two) times daily.  Patient taking differently: Take 17 g by mouth as needed for mild constipation.   Lenward Chancellor D, DO Taking Active Self  potassium chloride SA (KLOR-CON) 20 MEQ tablet [id number] Yes Take 20 mEq by mouth 3 (three) times daily. [provider] Taking Active Self           Med Note (SATTERFIELD, DARIUS E   Wed Mar 14, 2023  3:20 PM) Patient states he has started taking again   senna-docusate (SENOKOT-S) 8.6-50 MG tablet [id number] Yes Take 1 tablet by mouth daily.  Patient taking differently: Take 1 tablet by mouth as needed for mild constipation.   Dolan Amen, MD Taking Active Self  tamsulosin [facility]) 0.4 MG CAPS capsule [id number] Yes Take 1 capsule by mouth at bedtime Kathleen Lime, MD Taking Active Self  tirzepatide [facility]) 15 MG/0.5ML Pen [id number] Yes Inject 15 mg  into the skin once a week. Thapa, [place], MD Taking Active Self           Med Note (SATTERFIELD, DARIUS E   Wed Mar 14, 2023  3:12 PM) Take on Wednesdays   traMADol (ULTRAM) 50 MG tablet [id number] Yes Take 1 tablet (50 mg total) by mouth every 12 (twelve) hours as needed.  Patient taking differently: Take 50 mg by mouth every 12 (twelve) hours as needed for moderate pain (pain score 4-6).   Cristie Hem, PA-C Taking Active Self            Home Care and Equipment/Supplies: Were Home Health Services Ordered?: No Any new equipment or medical supplies ordered?: No  Functional Questionnaire: Do you need assistance with bathing/showering or dressing?: No Do you need assistance with meal preparation?: No Do you need assistance with eating?: No Do you have difficulty maintaining continence: No Do  you need assistance with getting out of bed/getting out of a chair/moving?: No Do you have difficulty managing or taking your medications?: No  Follow up appointments reviewed: PCP Follow-up appointment confirmed?: Yes Date of PCP follow-up appointment?: 03/21/23 Follow-up Provider: [facility] Follow-up appointment confirmed?: Yes Date of Specialist follow-up appointment?: 05/15/23 Follow-Up Specialty Provider:: [place] Thapa, endocrinilogy Do you need transportation to your follow-up appointment?: No (Patient uses [facility] [place]) Do you understand care options if your condition(s) worsen?: Yes-patient verbalized understanding  SDOH Interventions Today    Flowsheet Row Most Recent Value  SDOH Interventions   Food Insecurity Interventions --  [patient states he gets $208/month food stamps]  Housing Interventions Intervention Not Indicated  Transportation Interventions Intervention Not Indicated  [Patient reports he uses [facility] [place]]  Utilities Interventions Intervention Not Indicated       Goals Addressed               This Visit's Progress     TOC Care Plan - Patient will report no readmissions in the next 30 days (pt-stated)        Current Barriers:  Knowledge deficit related to changes with medications   RNCM Clinical Goal(s):  Patient will work with the Care Management team over the next 30 days to address Transition of Care Barriers: Medication Management Patient will take all medications exactly as prescribed and will call provider for medication related questions as evidenced by patient report and health record Patient will attend all scheduled medical appointments: PCP appointment 03/21/23 as evidenced by patient report and health record Patient will contact endocrinology office to move up scheduled appointment related to change with diabetic medications  Interventions: Evaluation of current treatment plan related to  self management  and patient's adherence to plan as established by provider  Transitions of Care:  New goal. Contacted provider for patient needs - Patient reporting non productive cough that is keeping him up at night Assessed if patient has "shrinker" for AKA to compress for prosthetic fitting - Patient confirmed he has the shrinker  Heart Failure Interventions:  (Status:  New goal.) Short Term Goal Basic overview and discussion of pathophysiology of Heart Failure reviewed Provided education on low sodium diet Discussed the importance of keeping all appointments with provider Assessed social determinant of health barriers   Diabetes Interventions:  (Status:  New goal.) Short Term Goal Assessed patient's understanding of A1c goal: <6.5% Provided education to patient about basic DM disease process Reviewed medications with patient and discussed importance of medication adherence Reviewed scheduled/upcoming provider appointments including: Educated patient on importance  of moving up his endocrinology appointment from May related to his recent medication changes Lab Results  Component Value Date   HGBA1C 5.2 12/06/2022    Patient Goals/Self-Care Activities: Participate in Transition of Care Program/Attend [facility] scheduled calls Notify RN Care Manager of [facility] call rescheduling needs Take all medications as prescribed Attend all scheduled provider appointments (PCP 03/21/23)and call endocrinology to move up next appointment from May call office if I gain more than 2 pounds in one day or 5 pounds in one week keep legs up while sitting use salt in moderation watch for swelling in feet, ankles and legs every day know when to call the doctor:Gain more than 3 pounds in one day, more than  5 pounds in one week, or shortness of breath or worsening lower leg edema If your fasting blood glucose in the morning remains elevated (greater than 300mg  every morning) please give the clinic a call and your endocrinologist  office for advice on changing your insulin regimen. If your fasting blood glucose level or any blood glucose reading throughout the day is below 70, please call the either our clinic or the endocrinologist office NovoLOG FlexPen 100 UNIT/ML FlexPen - Follow Sliding Scale: Inject 0-10 Units into the skin 3 (three) times daily with meals. Glucose 141-180: inject 4 units, Glucose 181-220: inject 6 units, Glucose 221-260: inject 8 units, Glucose 261-300: inject 10 units, If glucose if greater than 300, please inject 12 units and call your PCP and/or endocrinologist office  Follow Up Plan:  Telephone follow up appointment with care management team member scheduled for:  03/27/23 9:30am The patient has been provided with contact information for the care management team and has been advised to call with any health related questions or concerns.          Hilbert Odor RN, CCM [place]  VBCI-Population Health RN Care Manager [phone]

## 2023-03-19 NOTE — Telephone Encounter (Signed)
 Copied from CRM [id number]. Topic: Clinical - Medical Advice >> Mar 19, 2023 10:08 AM Dennison Nancy wrote: Reason for CRM:  Bjorn Loser a nurse  with [facility] call that patient has a transition with care appointment coming up on Wednesday  for patient , want to know if there some medication that can be prescribed for the patient he just has an cough before his appointment he has a cough and not able to sleep due to the cough  [id number] - [place] ([place]), [place] - [address] [place] [address] Phone: [phone] Fax: [phone] Hours: Not open 24 hours   Chief Complaint: cough Symptoms: dry cough, worse when laying down Frequency: yesterday Pertinent Negatives: Patient denies fever or SOB Disposition: [] ED /[] Urgent Care (no appt availability in office) / [] Appointment(In office/virtual)/ []  [facility]/ [x] Home Care/ [] Refused Recommended Disposition /[] [facility]/ []  Follow-up with PCP Additional Notes: pt states cough only present when lying down for most part. Has Robitussin but has not tried yet. Advised pt to try Robitussin OTC and if no relief can call back for appt or can FU when has HFU on 03/21/23. Care advice given and pt verbalized understanding.    Reason for Disposition  Cough  Answer Assessment - Initial Assessment Questions 1. ONSET: "When did the cough begin?"      Yesterday  2. SEVERITY: "How bad is the cough today?"      Worse when laying down  3. SPUTUM: "Describe the color of your sputum" (none, dry cough; clear, white, yellow, green)     None  5. DIFFICULTY BREATHING: "Are you having difficulty breathing?" If Yes, ask: "How bad is it?" (e.g., mild, moderate, severe)    - MILD: No SOB at rest, mild SOB with walking, speaks normally in sentences, can lie down, no retractions, pulse < 100.    - MODERATE: SOB at rest, SOB with minimal exertion and prefers to sit, cannot lie down flat, speaks in  phrases, mild retractions, audible wheezing, pulse 100-120.    - SEVERE: Very SOB at rest, speaks in single words, struggling to breathe, sitting hunched forward, retractions, pulse > 120      no 6. FEVER: "Do you have a fever?" If Yes, ask: "What is your temperature, how was it measured, and when did it start?"     no 8. LUNG HISTORY: "Do you have any history of lung disease?"  (e.g., pulmonary embolus, asthma, emphysema)     no 10. OTHER SYMPTOMS: "Do you have any other symptoms?" (e.g., runny nose, wheezing, chest pain)       no  Protocols used: Cough - Acute Non-Productive-A-AH

## 2023-03-21 ENCOUNTER — Ambulatory Visit (INDEPENDENT_AMBULATORY_CARE_PROVIDER_SITE_OTHER): Admitting: Student

## 2023-03-21 VITALS — BP 144/65 | HR 88 | Temp 97.8°F

## 2023-03-21 DIAGNOSIS — I5032 Chronic diastolic (congestive) heart failure: Secondary | ICD-10-CM

## 2023-03-21 DIAGNOSIS — M109 Gout, unspecified: Secondary | ICD-10-CM

## 2023-03-21 DIAGNOSIS — Z794 Long term (current) use of insulin: Secondary | ICD-10-CM

## 2023-03-21 DIAGNOSIS — E8770 Fluid overload, unspecified: Secondary | ICD-10-CM

## 2023-03-21 DIAGNOSIS — E876 Hypokalemia: Secondary | ICD-10-CM

## 2023-03-21 DIAGNOSIS — S78111A Complete traumatic amputation at level between right hip and knee, initial encounter: Secondary | ICD-10-CM | POA: Diagnosis not present

## 2023-03-21 DIAGNOSIS — E119 Type 2 diabetes mellitus without complications: Secondary | ICD-10-CM | POA: Diagnosis not present

## 2023-03-21 LAB — BASIC METABOLIC PANEL
Anion gap: 9 (ref 5–15)
BUN: 67 mg/dL — ABNORMAL HIGH (ref 6–20)
CO2: 21 mmol/L — ABNORMAL LOW (ref 22–32)
Calcium: 8.7 mg/dL — ABNORMAL LOW (ref 8.9–10.3)
Chloride: 111 mmol/L (ref 98–111)
Creatinine, Ser: 2.88 mg/dL — ABNORMAL HIGH (ref 0.61–1.24)
GFR, Estimated: 26 mL/min — ABNORMAL LOW (ref >60)
Glucose, Bld: 110 mg/dL — ABNORMAL HIGH (ref 70–99)
Potassium: 3.8 mmol/L (ref 3.5–5.1)
Sodium: 141 mmol/L (ref 135–145)

## 2023-03-21 NOTE — Assessment & Plan Note (Signed)
 Patient states that his wrist is doing much better.  Denies ongoing pain of the wrist.  He has completed his steroid taper.  We discussed possibly starting preventative medication for gout such as allopurinol.  Given that we are actively diuresing and trying to optimize his volume status, we will hold off on further workup for this at this time. He may benefit from uric acid level testing, initiation of allopurinol, and monitoring at subsequent close follow-ups.

## 2023-03-21 NOTE — Progress Notes (Signed)
 Subjective:  CC: Hospital follow-up, volume overload, gout, type 2 diabetes  HPI:  [name] is a 48 y.o. person with a past medical history stated below and presents today for the stated chief complaint. Please see problem based assessment and plan for additional details.  Past Medical History:  Diagnosis Date   Acute venous embolism and thrombosis of deep vessels of proximal lower extremity (HCC) 07/19/2011   Anemia    Anginal pain (HCC)    pt denies   Anxiety    Chest pain, neg MI, normal coronaries by cath 02/18/2013   pt denies   CHF (congestive heart failure) (HCC)    Chronic renal disease, stage IV (HCC) 02/19/2013   Colon polyps    Depression    Diabetic ulcer of right foot (HCC)    DVT (deep venous thrombosis) (HCC) 09/2002   patient reports additional DVTs in '06 & '11 (unconfirmed)   ED (erectile dysfunction)    GERD (gastroesophageal reflux disease)    History of blood transfusion    "related to OR" (10/31/2016)   Hyperlipidemia 02/19/2013   Hypertension    Nausea & vomiting    "constant for the last couple weeks" (10/31/2016)   Nephrotic syndrome    Obesity    BMI 44, weight 346 pounds 01/30/14   OSA (obstructive sleep apnea) 02/28/2018   Mild obstructive sleep apnea with an AHI of 9.8/h but severe during REM sleep with an AHI of 33.8/h.  Oxygen saturations dropped to 86% and there was moderate snoring   Peripheral edema    Pneumonia    Prosthesis adjustment 08/17/2016   Pulmonary embolism (HCC) 09/2002   treated with 6 months of warfarin   Pyelonephritis 02/02/2018   Type I diabetes mellitus (HCC) dx'd 2001   Urinary retention     Current Outpatient Medications on File Prior to Visit  Medication Sig Dispense Refill   acetaminophen (TYLENOL) 325 MG tablet Take 2 tablets (650 mg total) by mouth every 6 (six) hours. (Patient taking differently: Take 650 mg by mouth every 6 (six) hours as needed for mild pain (pain score 1-3).) 30 tablet 0    amLODipine (NORVASC) 10 MG tablet Take 10 mg by mouth at bedtime.     B-D UF III MINI PEN NEEDLES 31G X 5 MM MISC USE 1 NEEDLE FIVE TIMES DAILY 400 each 0   Blood Glucose Monitoring Suppl (ACCU-CHEK GUIDE ME) w/Device KIT Use to check blood sugar up to 3 times a day 1 kit 1   carvedilol (COREG) 25 MG tablet TAKE 1 TABLET BY MOUTH TWICE DAILY WITH A MEAL 180 tablet 0   cloNIDine (CATAPRES - DOSED IN MG/24 HR) 0.1 mg/24hr patch 0.1 mg once a week.     diclofenac Sodium (VOLTAREN) 1 % GEL Apply 2 g topically 4 (four) times daily. (Patient taking differently: Apply 2 g topically daily as needed (for pain).) 100 g 3   ELIQUIS 5 MG TABS tablet Take 1 tablet by mouth twice daily 180 tablet 0   fluticasone (FLONASE) 50 MCG/ACT nasal spray Place 2 sprays into both nostrils daily. (Patient taking differently: Place 2 sprays into both nostrils daily as needed for allergies.) 15.8 mL 2   furosemide (LASIX) 80 MG tablet Take 1 tablet (80 mg total) by mouth 2 (two) times daily. 30 tablet 1   gabapentin (NEURONTIN) 100 MG capsule TAKE 1 CAPSULE BY MOUTH THREE TIMES DAILY (Patient taking differently: Take 100 mg by mouth 2 (two) times daily.) 270  capsule 0   Glucagon (GVOKE HYPOPEN 1-PACK) 1 MG/0.2ML SOAJ Inject 1 mg into the skin as needed (low blood sugar with impaired consciousness). 0.4 mL 2   glucose blood (ACCU-CHEK GUIDE) test strip Check blood sugar up to 3 times a day 300 each 3   hydrALAZINE (APRESOLINE) 100 MG tablet Take 100 mg by mouth 3 (three) times daily.     insulin degludec (TRESIBA FLEXTOUCH) 200 UNIT/ML FlexTouch Pen Inject 50 Units into the skin daily. 15 mL 0   Insulin Pen Needle (B-D UF III MINI PEN NEEDLES) 31G X 5 MM MISC 1 each by Other route See admin instructions. USE 4-5 TIMES DAILY 200 each 0   Insulin Pen Needle (INSUPEN PEN NEEDLES) 32G X 4 MM MISC Use as directed 100 each 0   Lancet Devices (ACCU-CHEK SOFTCLIX) lancets Use to check blood sugars 5 times a day. Dx code:E11.9. Insulin  dependent. (Patient taking differently: 1 each by Other route See admin instructions. Use to check blood sugars 5 times a day. Dx code:E11.9. Insulin dependent.) 6 each 3   lidocaine (HM LIDOCAINE PATCH) 4 % Place 1 patch onto the skin daily. (Patient not taking: Reported on 03/19/2023) 5 patch 0   lovastatin (MEVACOR) 20 MG tablet Take 1 tablet by mouth once daily 90 tablet 0   NOVOLOG FLEXPEN 100 UNIT/ML FlexPen Inject 0-10 Units into the skin 3 (three) times daily with meals. Glucose 141-180: inject 4 units, Glucose 181-220: inject 6 units, Glucose 221-260: inject 8 units, Glucose 261-300: inject 10 units, If glucose if greater than 300, please inject 12 units and call your PCP and/or endocrinologist office 30 mL 4   oxybutynin (DITROPAN) 5 MG tablet TAKE 1 TABLET BY MOUTH THREE TIMES DAILY 90 tablet 0   pantoprazole (PROTONIX) 20 MG tablet Take 1 tablet (20 mg total) by mouth daily. (Patient not taking: Reported on 03/19/2023) 25 tablet 0   polyethylene glycol (MIRALAX / GLYCOLAX) 17 g packet Take 17 g by mouth 2 (two) times daily. (Patient taking differently: Take 17 g by mouth as needed for mild constipation.) 14 each 0   potassium chloride SA (KLOR-CON) 20 MEQ tablet Take 20 mEq by mouth 3 (three) times daily.     senna-docusate (SENOKOT-S) 8.6-50 MG tablet Take 1 tablet by mouth daily. (Patient taking differently: Take 1 tablet by mouth as needed for mild constipation.) 90 tablet 3   tamsulosin (FLOMAX) 0.4 MG CAPS capsule Take 1 capsule by mouth at bedtime 90 capsule 0   tirzepatide (MOUNJARO) 15 MG/0.5ML Pen Inject 15 mg into the skin once a week. 6 mL 4   traMADol (ULTRAM) 50 MG tablet Take 1 tablet (50 mg total) by mouth every 12 (twelve) hours as needed. (Patient taking differently: Take 50 mg by mouth every 12 (twelve) hours as needed for moderate pain (pain score 4-6).) 30 tablet 1   No current facility-administered medications on file prior to visit.    Review of Systems: Please see  assessment and plan for pertinent positives and negatives.  Objective:   Vitals:   03/21/23 0903  BP: (!) 144/65  Pulse: 88  Temp: 97.8 F (36.6 C)  TempSrc: Oral  SpO2: 96%    Physical Exam: Constitutional: Well-appearing Cardiovascular: Regular rate and rhythm Pulmonary/Chest: Decreased breath sounds at the lung bases, Rales auscultated bilaterally.   Abdominal: soft, non-tender, non-distended Extremities: 2+ edema of the left lower extremity to the distal thigh.  2+ edema of the distal residual right lower extremity  Psych: Appropriate affect, concern mood Thought process is linear and is goal-directed.     Assessment & Plan:  Volume overload This is a 48 year old male with pertinent past medical history of type 2 diabetes, HFpEF, CKD 4, obesity, gout, and right sided above-the-knee amputation. He was seen in clinic on 02/22/2023, at which time he was found to have a warm and swollen joint.  He was admitted for further medical workup due to concerns of septic joint, and was diagnosed with gout.  He was discharged with a prednisone taper and 80 mg Lasix daily with close hospital follow-up.  He presented to the [facility] shortly afterwards for hospital follow-up and was found to have been taking 40 mg Lasix twice daily.  He was instructed to take 80 mg of Lasix daily and return in 1 month. Shortly after his hospital follow-up appointment, he presented with volume overload and was hospitalized on 03/13/2023 due to acute hypoxic respiratory failure.  During that admission he was diuresed and was discharged home on his previous home dose of 80 mg Lasix twice daily.  Today the patient presents with volume overload, they have 2+ edema to on the left, and significant edema of the residual lower extremity on the right.  Decreased breath sounds at the bases as well as some crackles.  He has had some increased exertional dyspnea and has developed a new cough over the last 3 days. He states his cough is  particularly bad when he lays back flat, and he has slept in a recliner for the last several days due to this.  He denies fevers, chills, congestion.  He has no history of asthma or COPD, he denies wheezing.  He states that his swelling is about the same since he left the hospital.  This has been understandably very frustrating for him, and he desperately would like to get his volume status under control.  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  We discussed the risks and benefits of various treatment options.  Patient stated that they would like to be admitted for volume management, however his oxygen saturations are well-maintained today and there are no hospital beds currently available.  We discussed this, he is amenable to outpatient management of his volume status.  Stat-BMP was obtained today which demonstrated improved BUN from discharge from 78 => 67.  Creatinine is slightly worse from  discharge from 2.5 => 2.8, though this is near his baseline.  GFR similar ranging from 31-26 over the last several weeks.  Potassium today was low end of normal at 3.8.  We will increase the patient's Lasix dosing to 80 mg 3 times daily.  We did offer of 120 twice daily dosing, however patient stated that he would prefer to take the medication 3 times daily.  The patient typically takes his first dose at 4 AM, and I have advised the patient to take the medicine every 6 hours at 4 AM, 10 AM, and 4 PM.  He will also increase his potassium dosing to 80 mEq daily.  The patient's lab work and plan were discussed with the patient over the phone.  The patient demonstrated understanding of the plan via teach back.  Follow-up was arranged, the patient will follow-up with the internal medicine clinic on Monday, 03/26/2023.  I will also reach out to the front desk to schedule him to see me on 03/30/2023.  Return  precautions such as feeling  unwell, worsening edema, worsening respiratory status, and fatigue were discussed.  Patient states they will not hesitate to call us or seek additional help if they do not feel well.  The patient requested that I speak with his nephrologist regarding this plan, I have sent them a message to keep them up to date on the situation.  Unilateral AKA, right (HCC) The residual right lower extremity is edematous on exam today.  The patient's wheelchair has limited padding, it was discussed with the patient that the ergonomics of the wheelchair in combination with the weight of the additional fluid and taut skin from edema will predispose him to possible right residual lower extremity injury.  Cushioning was placed under the patient's residual extremity in office.  He was informed that he should reach out to the wheelchair manufacture for repair/padding. He did receive the stump shrinker, but states that this does not fit on his edematous residual extremity.  Insulin dependent type 2 diabetes mellitus, controlled (HCC) Very well-controlled in the past.  His insulin regiment was modified at time of discharge as he was on a steroid taper.  He has a CGM and keeps close track of his blood sugars.  He states his blood sugars have been relatively well-controlled, he is comfortable managing his sugars.  We will continue to monitor these at subsequent visits and adjust his insulin regimen as required.  Acute gout of right wrist Patient states that his wrist is doing much better.  Denies ongoing pain of the wrist.  He has completed his steroid taper.  We discussed possibly starting preventative medication for gout such as allopurinol.  Given that we are actively diuresing and trying to optimize his volume status, we will hold off on further workup for this at this time. He may benefit from uric acid level testing, initiation of allopurinol, and monitoring at subsequent close follow-ups.   Patient seen with Dr.  Willow Ora MD [facility]  PGY-1 Pager: [phone]  Phone: [phone] Date 03/21/2023  Time 3:20 PM

## 2023-03-21 NOTE — Assessment & Plan Note (Addendum)
 The residual right lower extremity is edematous on exam today.  The patient's wheelchair has limited padding, it was discussed with the patient that the ergonomics of the wheelchair in combination with the weight of the additional fluid and taut skin from edema will predispose him to possible right residual lower extremity injury.  Cushioning was placed under the patient's residual extremity in office.  He was informed that he should reach out to the wheelchair manufacture for repair/padding. He did receive the stump shrinker, but states that this does not fit on his edematous residual extremity.

## 2023-03-21 NOTE — Patient Instructions (Addendum)
 Thank you, [name] for allowing [place] to provide your care today.  I have ordered the following tests for you:   Lab Orders         BMP w Anion Gap (STAT/Sunquest-performed on-site)      I will call you shortly to go over the results of your lab work with you.  If you have any concerns please know that there is a physician in house who you may call at anytime using the clinic phone number.  Additionally the emergency department is always an option any worsening in your status.  We will review your potassium and kidney function and make recommendations for potassium dosing and dose adjustments for your diuretic.   Follow up:  We will call you to coordinate close follow up.   We look forward to seeing you next time. Please call our clinic at [phone] if you have any questions or concerns. The best time to call is Monday-Friday from 9am-4pm, but there is someone available 24/7. If after hours or the weekend, call the main hospital number and ask for the Internal Medicine Resident On-Call. If you need medication refills, please notify your pharmacy one week in advance and they will send [place] a request.   Thank you for trusting me with your care. Wishing you the best!  Lovie Macadamia MD [facility]

## 2023-03-21 NOTE — Assessment & Plan Note (Addendum)
 This is a 48 year old male with pertinent past medical history of type 2 diabetes, HFpEF, CKD 4, obesity, gout, and right sided above-the-knee amputation. He was seen in clinic on 02/22/2023, at which time he was found to have a warm and swollen joint.  He was admitted for further medical workup due to concerns of septic joint, and was diagnosed with gout.  He was discharged with a prednisone taper and 80 mg Lasix daily with close hospital follow-up.  He presented to the [facility] shortly afterwards for hospital follow-up and was found to have been taking 40 mg Lasix twice daily.  He was instructed to take 80 mg of Lasix daily and return in 1 month. Shortly after his hospital follow-up appointment, he presented with volume overload and was hospitalized on 03/13/2023 due to acute hypoxic respiratory failure.  During that admission he was diuresed and was discharged home on his previous home dose of 80 mg Lasix twice daily.  Today the patient presents with volume overload, they have 2+ edema to on the left, and significant edema of the residual lower extremity on the right.  Decreased breath sounds at the bases as well as some crackles.  He has had some increased exertional dyspnea and has developed a new cough over the last 3 days. He states his cough is particularly bad when he lays back flat, and he has slept in a recliner for the last several days due to this.  He denies fevers, chills, congestion.  He has no history of asthma or COPD, he denies wheezing.  He states that his swelling is about the same since he left the hospital.  This has been understandably very frustrating for him, and he desperately would like to get his volume status under control.  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  We discussed the risks and benefits of various treatment options.  Patient stated that they would like to be admitted for volume  management, however his oxygen saturations are well-maintained today and there are no hospital beds currently available.  We discussed this, he is amenable to outpatient management of his volume status.  Stat-BMP was obtained today which demonstrated improved BUN from discharge from 78 => 67.  Creatinine is slightly worse from  discharge from 2.5 => 2.8, though this is near his baseline.  GFR similar ranging from 31-26 over the last several weeks.  Potassium today was low end of normal at 3.8.  We will increase the patient's Lasix dosing to 80 mg 3 times daily.  We did offer of 120 twice daily dosing, however patient stated that he would prefer to take the medication 3 times daily.  The patient typically takes his first dose at 4 AM, and I have advised the patient to take the medicine every 6 hours at 4 AM, 10 AM, and 4 PM.  He will also increase his potassium dosing to 80 mEq daily.  The patient's lab work and plan were discussed with the patient over the phone.  The patient demonstrated understanding of the plan via teach back.  Follow-up was arranged, the patient will follow-up with the internal medicine clinic on Monday, 03/26/2023.  I will also reach out to the front desk to schedule him to see me on 03/30/2023.  Return precautions such as feeling unwell, worsening edema, worsening respiratory status, and fatigue were discussed.  Patient states they will not hesitate to  call us or seek additional help if they do not feel well.  The patient requested that I speak with his nephrologist regarding this plan, I have sent them a message to keep them up to date on the situation.

## 2023-03-21 NOTE — Assessment & Plan Note (Signed)
 Very well-controlled in the past.  His insulin regiment was modified at time of discharge as he was on a steroid taper.  He has a CGM and keeps close track of his blood sugars.  He states his blood sugars have been relatively well-controlled, he is comfortable managing his sugars.  We will continue to monitor these at subsequent visits and adjust his insulin regimen as required.

## 2023-03-22 ENCOUNTER — Ambulatory Visit (HOSPITAL_COMMUNITY)
Admission: RE | Admit: 2023-03-22 | Discharge: 2023-03-22 | Disposition: A | Discharge status: Home / Self Care | Payer: Medicare HMO | Source: Ambulatory Visit | Attending: Nephrology | Admitting: Nephrology

## 2023-03-22 ENCOUNTER — Telehealth: Payer: Self-pay | Admitting: *Deleted

## 2023-03-22 VITALS — BP 149/80 | HR 105 | Temp 97.4°F | Resp 17

## 2023-03-22 DIAGNOSIS — N2581 Secondary hyperparathyroidism of renal origin: Secondary | ICD-10-CM | POA: Diagnosis not present

## 2023-03-22 DIAGNOSIS — E119 Type 2 diabetes mellitus without complications: Secondary | ICD-10-CM | POA: Diagnosis not present

## 2023-03-22 DIAGNOSIS — N184 Chronic kidney disease, stage 4 (severe): Secondary | ICD-10-CM | POA: Insufficient documentation

## 2023-03-22 DIAGNOSIS — E876 Hypokalemia: Secondary | ICD-10-CM | POA: Diagnosis not present

## 2023-03-22 DIAGNOSIS — D631 Anemia in chronic kidney disease: Secondary | ICD-10-CM | POA: Diagnosis not present

## 2023-03-22 DIAGNOSIS — E1122 Type 2 diabetes mellitus with diabetic chronic kidney disease: Secondary | ICD-10-CM | POA: Diagnosis not present

## 2023-03-22 DIAGNOSIS — I5030 Unspecified diastolic (congestive) heart failure: Secondary | ICD-10-CM | POA: Diagnosis not present

## 2023-03-22 DIAGNOSIS — I130 Hypertensive heart and chronic kidney disease with heart failure and stage 1 through stage 4 chronic kidney disease, or unspecified chronic kidney disease: Secondary | ICD-10-CM | POA: Diagnosis not present

## 2023-03-22 DIAGNOSIS — Z794 Long term (current) use of insulin: Secondary | ICD-10-CM | POA: Diagnosis not present

## 2023-03-22 LAB — RENAL FUNCTION PANEL
Albumin: 2.8 g/dL — ABNORMAL LOW (ref 3.5–5.0)
Anion gap: 8 (ref 5–15)
BUN: 63 mg/dL — ABNORMAL HIGH (ref 6–20)
CO2: 23 mmol/L (ref 22–32)
Calcium: 8.9 mg/dL (ref 8.9–10.3)
Chloride: 111 mmol/L (ref 98–111)
Creatinine, Ser: 2.97 mg/dL — ABNORMAL HIGH (ref 0.61–1.24)
GFR, Estimated: 25 mL/min — ABNORMAL LOW (ref >60)
Glucose, Bld: 97 mg/dL (ref 70–99)
Phosphorus: 3.7 mg/dL (ref 2.5–4.6)
Potassium: 4.3 mmol/L (ref 3.5–5.1)
Sodium: 142 mmol/L (ref 135–145)

## 2023-03-22 LAB — IRON AND TIBC
Iron: 22 ug/dL — ABNORMAL LOW (ref 45–182)
Saturation Ratios: 9 % — ABNORMAL LOW (ref 17.9–39.5)
TIBC: 248 ug/dL — ABNORMAL LOW (ref 250–450)
UIBC: 226 ug/dL

## 2023-03-22 LAB — POCT HEMOGLOBIN-HEMACUE: Hemoglobin: 8.9 g/dL — ABNORMAL LOW (ref 13.0–17.0)

## 2023-03-22 LAB — FERRITIN: Ferritin: 714 ng/mL — ABNORMAL HIGH (ref 24–336)

## 2023-03-22 MED ORDER — DARBEPOETIN ALFA 200 MCG/0.4ML IJ SOSY
PREFILLED_SYRINGE | INTRAMUSCULAR | Status: AC
  Filled 2023-03-22: qty 0.4

## 2023-03-22 MED ORDER — DARBEPOETIN ALFA 200 MCG/0.4ML IJ SOSY
200.0000 ug | PREFILLED_SYRINGE | INTRAMUSCULAR | Status: DC
  Administered 2023-03-22: 200 ug via SUBCUTANEOUS

## 2023-03-22 NOTE — Telephone Encounter (Signed)
 Copied from CRM [id number]. Topic: Clinical - Medical Advice >> Mar 22, 2023  3:41 PM Maree Krabbe H wrote: Reason for CRM: Patient called today and wanted to get some notes over to the provider, patient said he went to the doctor for his kidney and Alonna Buckler NP. made adjustments to the orders they gave him on yesterday, Alonna Buckler gave him orders to take furosemide (LASIX) Two tablets twice a day on Tuesday, Thursday, Saturday and Sunday and on Monday, Wednesday and Friday take it three times daily, patient states that he will take it three times daily until Monday when he returns to the clinic. Patients callback number is [phone] if you need any additional information.

## 2023-03-22 NOTE — Telephone Encounter (Signed)
 Information forwarded to Dr. Ninfa Meeker who will be seeing patient on Monday.

## 2023-03-26 ENCOUNTER — Ambulatory Visit (INDEPENDENT_AMBULATORY_CARE_PROVIDER_SITE_OTHER): Admitting: Student

## 2023-03-26 VITALS — BP 145/76 | HR 91

## 2023-03-26 DIAGNOSIS — E119 Type 2 diabetes mellitus without complications: Secondary | ICD-10-CM | POA: Diagnosis not present

## 2023-03-26 DIAGNOSIS — G4700 Insomnia, unspecified: Secondary | ICD-10-CM | POA: Diagnosis not present

## 2023-03-26 DIAGNOSIS — Z794 Long term (current) use of insulin: Secondary | ICD-10-CM

## 2023-03-26 DIAGNOSIS — M109 Gout, unspecified: Secondary | ICD-10-CM

## 2023-03-26 DIAGNOSIS — E8770 Fluid overload, unspecified: Secondary | ICD-10-CM

## 2023-03-26 LAB — BASIC METABOLIC PANEL
Anion gap: 7 (ref 5–15)
BUN: 40 mg/dL — ABNORMAL HIGH (ref 6–20)
CO2: 23 mmol/L (ref 22–32)
Calcium: 8.5 mg/dL — ABNORMAL LOW (ref 8.9–10.3)
Chloride: 107 mmol/L (ref 98–111)
Creatinine, Ser: 2.93 mg/dL — ABNORMAL HIGH (ref 0.61–1.24)
GFR, Estimated: 26 mL/min — ABNORMAL LOW (ref >60)
Glucose, Bld: 123 mg/dL — ABNORMAL HIGH (ref 70–99)
Potassium: 4.3 mmol/L (ref 3.5–5.1)
Sodium: 137 mmol/L (ref 135–145)

## 2023-03-26 LAB — URIC ACID: Uric Acid, Serum: 11.9 mg/dL — ABNORMAL HIGH (ref 3.7–8.6)

## 2023-03-26 MED ORDER — TRESIBA FLEXTOUCH 200 UNIT/ML ~~LOC~~ SOPN: 45.0000 [IU] | PEN_INJECTOR | SUBCUTANEOUS | 0 refills | Status: DC

## 2023-03-26 MED ORDER — GABAPENTIN 100 MG PO CAPS: ORAL_CAPSULE | ORAL | 0 refills | Status: DC

## 2023-03-26 NOTE — Assessment & Plan Note (Addendum)
 Patient states right wrist is continue to feel all right.  He has a follow-up with orthopedics as well and is hoping to avoid any surgeries.  Discussed starting on allopurinol, he states he has been on this medication in the past.  Will obtain a uric acid level today and discuss allopurinol dosing following this. Plan: Uric acid

## 2023-03-26 NOTE — Patient Instructions (Addendum)
 Thank you, [name] for allowing [place] to provide your care today.  I have ordered the following tests for you:  Lab Orders         BMP w Anion Gap (STAT/Sunquest-performed on-site)         Hemoglobin A1c         Uric acid        Referrals ordered today:   Referral Orders  No referral(s) requested today     I have ordered the following medication/changed the following medications:     Start the following medications: Meds ordered this encounter  Medications   insulin degludec (TRESIBA FLEXTOUCH) 200 UNIT/ML FlexTouch Pen    Sig: Inject 46 Units into the skin daily.    Dispense:  15 mL    Refill:  0   gabapentin (NEURONTIN) 100 MG capsule    Sig: Take 100mg  in the morning, 100mg  in the afternoon, and 200mg  at night    Dispense:  270 capsule    Refill:  0     Follow up:  2 Weeks  for telehealth visit    We look forward to seeing you next time. Please call our clinic at [phone] if you have any questions or concerns. The best time to call is Monday-Friday from 9am-4pm, but there is someone available 24/7. If after hours or the weekend, call the main hospital number and ask for the Internal Medicine Resident On-Call. If you need medication refills, please notify your pharmacy one week in advance and they will send [place] a request.   Thank you for trusting me with your care. Wishing you the best!  Lovie Macadamia MD [facility]

## 2023-03-26 NOTE — Assessment & Plan Note (Signed)
 Ongoing since beginning prednisone.  Patient states that he is able to get to sleep but very easily awakes at night with any noise.  He also has issues going back to sleep should he wake up at night.  He does take gabapentin for pain, currently on 100 mg 3 times daily.  He does not have much trouble with sleep onset if he avoid screens early.  He denies caffeine intake. Plan: Will increase gabapentin to 100 in the mornings, 100 afternoon, and 200 mg at night.

## 2023-03-26 NOTE — Progress Notes (Signed)
 Subjective:  CC: Follow-up on volume overload  HPI:  [name] is a 48 y.o. person with a past medical history stated below and presents today for the stated chief complaint. Please see problem based assessment and plan for additional details.  Past Medical History:  Diagnosis Date   Acute venous embolism and thrombosis of deep vessels of proximal lower extremity (HCC) 07/19/2011   Anemia    Anginal pain (HCC)    pt denies   Anxiety    Chest pain, neg MI, normal coronaries by cath 02/18/2013   pt denies   CHF (congestive heart failure) (HCC)    Chronic renal disease, stage IV (HCC) 02/19/2013   Colon polyps    Depression    Diabetic ulcer of right foot (HCC)    DVT (deep venous thrombosis) (HCC) 09/2002   patient reports additional DVTs in '06 & '11 (unconfirmed)   ED (erectile dysfunction)    GERD (gastroesophageal reflux disease)    History of blood transfusion    "related to OR" (10/31/2016)   Hyperlipidemia 02/19/2013   Hypertension    Nausea & vomiting    "constant for the last couple weeks" (10/31/2016)   Nephrotic syndrome    Obesity    BMI 44, weight 346 pounds 01/30/14   OSA (obstructive sleep apnea) 02/28/2018   Mild obstructive sleep apnea with an AHI of 9.8/h but severe during REM sleep with an AHI of 33.8/h.  Oxygen saturations dropped to 86% and there was moderate snoring   Peripheral edema    Pneumonia    Prosthesis adjustment 08/17/2016   Pulmonary embolism (HCC) 09/2002   treated with 6 months of warfarin   Pyelonephritis 02/02/2018   Type I diabetes mellitus (HCC) dx'd 2001   Urinary retention     Current Outpatient Medications on File Prior to Visit  Medication Sig Dispense Refill   acetaminophen (TYLENOL) 325 MG tablet Take 2 tablets (650 mg total) by mouth every 6 (six) hours. (Patient taking differently: Take 650 mg by mouth every 6 (six) hours as needed for mild pain (pain score 1-3).) 30 tablet 0   amLODipine (NORVASC) 10 MG tablet  Take 10 mg by mouth at bedtime.     B-D UF III MINI PEN NEEDLES 31G X 5 MM MISC USE 1 NEEDLE FIVE TIMES DAILY 400 each 0   Blood Glucose Monitoring Suppl (ACCU-CHEK GUIDE ME) w/Device KIT Use to check blood sugar up to 3 times a day 1 kit 1   carvedilol (COREG) 25 MG tablet TAKE 1 TABLET BY MOUTH TWICE DAILY WITH A MEAL 180 tablet 0   cloNIDine (CATAPRES - DOSED IN MG/24 HR) 0.1 mg/24hr patch 0.1 mg once a week.     diclofenac Sodium (VOLTAREN) 1 % GEL Apply 2 g topically 4 (four) times daily. (Patient taking differently: Apply 2 g topically daily as needed (for pain).) 100 g 3   ELIQUIS 5 MG TABS tablet Take 1 tablet by mouth twice daily 180 tablet 0   fluticasone (FLONASE) 50 MCG/ACT nasal spray Place 2 sprays into both nostrils daily. (Patient taking differently: Place 2 sprays into both nostrils daily as needed for allergies.) 15.8 mL 2   furosemide (LASIX) 80 MG tablet Take 1 tablet (80 mg total) by mouth 2 (two) times daily. 30 tablet 1   Glucagon (GVOKE HYPOPEN 1-PACK) 1 MG/0.2ML SOAJ Inject 1 mg into the skin as needed (low blood sugar with impaired consciousness). 0.4 mL 2   glucose blood (ACCU-CHEK GUIDE)  test strip Check blood sugar up to 3 times a day 300 each 3   hydrALAZINE (APRESOLINE) 100 MG tablet Take 100 mg by mouth 3 (three) times daily.     Insulin Pen Needle (B-D UF III MINI PEN NEEDLES) 31G X 5 MM MISC 1 each by Other route See admin instructions. USE 4-5 TIMES DAILY 200 each 0   Insulin Pen Needle (INSUPEN PEN NEEDLES) 32G X 4 MM MISC Use as directed 100 each 0   Lancet Devices (ACCU-CHEK SOFTCLIX) lancets Use to check blood sugars 5 times a day. Dx code:E11.9. Insulin dependent. (Patient taking differently: 1 each by Other route See admin instructions. Use to check blood sugars 5 times a day. Dx code:E11.9. Insulin dependent.) 6 each 3   lidocaine (HM LIDOCAINE PATCH) 4 % Place 1 patch onto the skin daily. (Patient not taking: Reported on 03/19/2023) 5 patch 0   lovastatin  (MEVACOR) 20 MG tablet Take 1 tablet by mouth once daily 90 tablet 0   NOVOLOG FLEXPEN 100 UNIT/ML FlexPen Inject 0-10 Units into the skin 3 (three) times daily with meals. Glucose 141-180: inject 4 units, Glucose 181-220: inject 6 units, Glucose 221-260: inject 8 units, Glucose 261-300: inject 10 units, If glucose if greater than 300, please inject 12 units and call your PCP and/or endocrinologist office 30 mL 4   oxybutynin (DITROPAN) 5 MG tablet TAKE 1 TABLET BY MOUTH THREE TIMES DAILY 90 tablet 0   pantoprazole (PROTONIX) 20 MG tablet Take 1 tablet (20 mg total) by mouth daily. (Patient not taking: Reported on 03/19/2023) 25 tablet 0   polyethylene glycol (MIRALAX / GLYCOLAX) 17 g packet Take 17 g by mouth 2 (two) times daily. (Patient taking differently: Take 17 g by mouth as needed for mild constipation.) 14 each 0   potassium chloride SA (KLOR-CON) 20 MEQ tablet Take 20 mEq by mouth 3 (three) times daily.     senna-docusate (SENOKOT-S) 8.6-50 MG tablet Take 1 tablet by mouth daily. (Patient taking differently: Take 1 tablet by mouth as needed for mild constipation.) 90 tablet 3   tamsulosin (FLOMAX) 0.4 MG CAPS capsule Take 1 capsule by mouth at bedtime 90 capsule 0   tirzepatide (MOUNJARO) 15 MG/0.5ML Pen Inject 15 mg into the skin once a week. 6 mL 4   traMADol (ULTRAM) 50 MG tablet Take 1 tablet (50 mg total) by mouth every 12 (twelve) hours as needed. (Patient taking differently: Take 50 mg by mouth every 12 (twelve) hours as needed for moderate pain (pain score 4-6).) 30 tablet 1   No current facility-administered medications on file prior to visit.    Review of Systems: Please see assessment and plan for pertinent positives and negatives.  Objective:   Vitals:   03/26/23 1315  BP: (!) 145/76  Pulse: 91  SpO2: 96%    Physical Exam: Constitutional: Well-appearing Cardiovascular: Regular rate and rhythm Pulmonary/Chest: lungs clear to auscultation bilaterally Extremities: 2+  edema of the left lower extremity to mid shin, improved pitting edema in the residual right lower extremity Psych: Appropriate affect Thought process is linear and is goal-directed.     Assessment & Plan:  Insulin dependent type 2 diabetes mellitus, controlled (HCC) A1c is very well-controlled in the past, he is currently on Tresiba 50 units daily plus sliding scale insulin.  In reviewing his CGM data today, he does have some lows when working out in the mornings.  We will go ahead and decrease his long-acting insulin to 45 units daily  at this time, and make further dose adjustments at subsequent visits.  He was on 37 units daily prior to hospitalization prednisone taper.  I will route today's visit to the patient's endocrinologist at his request. Plan: Decrease Tresiba 45 units daily, close follow-up on blood sugars   Volume overload Last seen 03/21/2023 for volume overload, at that time Lasix was changed from 80 twice daily to 80 three times daily.  He has been taking this increased dose since then.  He also saw his nephrologist who recommended that he alternate 3 times daily and twice daily dosing.  Plan moving forward will be to follow this recommendation.  He says he has been doing better from a volume status standpoint since last visit.  His edema has decreased, his cough is much improved, he is able to lay back flat at night.  We will continue to diurese the patient, and keep a close eye on him.  Offered to see patient again this Friday, patient would prefer to stretch out appointment a bit further.  Will obtain lab work today and I will call him with the results to discuss.  I will route today's visit note to patient's nephrologist.  Plan: Lasix 80 mg twice daily, 3 times daily alternating days. BMP to assess kidney function, electrolytes   Gout of right wrist Patient states right wrist is continue to feel all right.  He has a follow-up with orthopedics as well, he and is hoping to  avoid any surgeries.  Discussed starting on allopurinol, he states he has been on this medication in the past.  Will obtain a uric acid level today and discuss allopurinol dosing following this. Plan: Uric acid   Insomnia Ongoing since beginning prednisone.  Patient states that he is able to get to sleep but very easily awakes at night with any noise.  He also has issues going back to sleep should he wake up at night.  He does take gabapentin for pain, currently on 100 mg 3 times daily.  He does not have much trouble with sleep onset if he avoid screens early.  He denies caffeine intake. Plan: Will increase gabapentin to 100 in the mornings, 100 afternoon, and 200 mg at night.    Patient seen with Dr. Willow Ora MD [facility]  PGY-1 Pager: [phone]  Phone: [phone] Date 03/26/2023  Time 3:30 PM

## 2023-03-26 NOTE — Assessment & Plan Note (Addendum)
 Last seen 03/21/2023 for volume overload, at that time Lasix was changed from 80 twice daily to 80 three times daily.  He has been taking this increased dose since then.  He also saw his nephrologist who recommended that he alternate 3 times daily and twice daily dosing.  Plan moving forward will be to follow this recommendation.  He says he has been doing better from a volume status standpoint since last visit.  His edema has decreased, his cough is much improved, he is able to lay back flat at night.  We will continue to diurese the patient, and keep a close eye on him.  Offered to see patient again this Friday, patient would prefer to stretch out appointment a bit further.  Will obtain lab work today and I will call him with the results to discuss.  I will route today's visit note to patient's nephrologist.  Plan: Lasix 80 mg twice daily, 3 times daily alternating days. BMP to assess kidney function, electrolytes

## 2023-03-26 NOTE — Assessment & Plan Note (Addendum)
 A1c is very well-controlled in the past, he is currently on Tresiba 50 units daily plus sliding scale insulin.  In reviewing his CGM data today, he does have some lows when working out in the mornings.  We will go ahead and decrease his long-acting insulin to 45 units daily at this time, and make further dose adjustments at subsequent visits.  He was on 37 units daily prior to hospitalization prednisone taper.  I will route today's visit to the patient's endocrinologist at his request. Plan: Decrease Tresiba 45 units daily, close follow-up on blood sugars

## 2023-03-27 ENCOUNTER — Telehealth: Payer: Self-pay | Admitting: Student

## 2023-03-27 ENCOUNTER — Other Ambulatory Visit: Payer: Self-pay

## 2023-03-27 DIAGNOSIS — M109 Gout, unspecified: Secondary | ICD-10-CM

## 2023-03-27 LAB — HEMOGLOBIN A1C
Hgb A1c MFr Bld: 4.9 % (ref 4.8–5.6)
Mean Plasma Glucose: 93.93 mg/dL

## 2023-03-27 MED ORDER — ALLOPURINOL 100 MG PO TABS
50.0000 mg | ORAL_TABLET | ORAL | 2 refills | Status: DC
Start: 2023-03-27 — End: 2023-07-21

## 2023-03-27 MED ORDER — ALLOPURINOL 100 MG PO TABS: 50.0000 mg | ORAL_TABLET | Freq: Every day | ORAL | 2 refills | Status: DC

## 2023-03-27 NOTE — Telephone Encounter (Addendum)
 I spoke with [name] on the phone. Patient's identity was confirmed using two patient specific identifiers. We discussed the patient's lab results.  Creatinine and GFR unchanged from prior.  BUN improved.  We will continue with alternating twice daily and 3 times daily doses and patient will follow-up with [place] next week for repeat volume exam and lab work.  Uric acid levels elevated at 11.9.  Discussed beginning allopurinol 50 mg daily with this patient.  He has taken this medication before in the past.  We did discuss possible options for gout prophylaxis while initiating allopurinol.  NSAIDs contraindicated due to his renal function, colchicine as well due to renal function plus drug-drug interactions with carvedilol.  Patient would not like to take steroids again.  After joint decision-making, we will begin allopurinol and patient will follow with orthopedics in 2 days.  Based on patient's GFR, we will begin 50 mg allopurinol every other day and continue to monitor uric acid at subsequent visits. This was discussed with the patient.  A1c 4.9.  At last visit he was having some hypoglycemia especially after working out.  Patient states this morning he did not eat breakfast and had an episode of hypoglycemia as well.  Previously had decrease his Tresiba to 45 units daily, instructed patient to further dose decreased to 40 units daily.  He had been on 37 units before his prior hospitalization and initiation of prednisone.

## 2023-03-27 NOTE — Patient Outreach (Signed)
 Care Management  Transitions of Care Program Transitions of Care Post-discharge week 2   03/27/2023 Name: [name] MRN: [id number] DOB: [birth year]  Subjective: [name] is a 48 y.o. year old male who is a primary care patient of Gust Rung, DO. The Care Management team Engaged with patient Engaged with patient by telephone to assess and address transitions of care needs.   Consent to Services:  Patient was given information about care management services, agreed to services, and gave verbal consent to participate.   Assessment:     Patient reports cough has resolved. Patient saw PCP 3/12 and again 3/17 related to fluid and cough. Lasix was increased and patient states he is doing better. Patient states he also saw nephrology 03/22/23 and reports Lasix dose was changes to 80mg  3x/day M,W,F and all other days 80mg  2x/day. Patient states Evaristo Bury decreased to 45units and Gabapentin dose was adjusted to 100mg  in AM and 200mg  in PM and patient states he is sleeping well now. Patient agrees to ongoing [facility] RN calls and next appointment was scheduled.       SDOH Interventions    Flowsheet Row Telephone from 03/19/2023 in [place] [facility] Clinical Support from 01/31/2023 in [facility] - [facility] Dept Of [place] Telephone from 09/07/2022 in [facility] Care Coordination Clinical Support from 01/24/2022 in [facility] - A Dept Of [place] Care Coordination from 10/21/2021 in [facility] Care Coordination ED to Hosp-Admission (Discharged) from 10/01/2020 in [facility] 3E HF PCU  SDOH Interventions        Food Insecurity Interventions --  [patient states he gets $208/month food stamps] Intervention Not Indicated Intervention Not Indicated Intervention Not Indicated Intervention Not Indicated Intervention Not Indicated  Housing Interventions  Intervention Not Indicated Intervention Not Indicated -- -- -- Intervention Not Indicated  Transportation Interventions Intervention Not Indicated  [Patient reports he uses [employer] [place]] Intervention Not Indicated Intervention Not Indicated Intervention Not Indicated Intervention Not Indicated Intervention Not Indicated  Utilities Interventions Intervention Not Indicated Intervention Not Indicated Intervention Not Indicated -- -- --  Alcohol Usage Interventions -- Intervention Not Indicated (Score <7) -- -- -- --  Depression Interventions/Treatment  -- PHQ2-9 Score <4 Follow-up Not Indicated -- -- -- --  Financial Strain Interventions -- Intervention Not Indicated -- -- -- Intervention Not Indicated  Physical Activity Interventions -- Intervention Not Indicated -- -- -- --  Stress Interventions -- Intervention Not Indicated -- -- -- --  Social Connections Interventions -- Intervention Not Indicated -- -- -- --  Health Literacy Interventions -- Intervention Not Indicated -- -- -- --        Goals Addressed               This Visit's Progress     TOC Care Plan - Patient will report no readmissions in the next 30 days (pt-stated)        Current Barriers:  Knowledge deficit related to changes with medications 03/27/23 TOC RN reviewed 3/12 and 3/17 PCP visits and completed Medication reconciliation with patient   RNCM Clinical Goal(s):  Patient will work with the Care Management team over the next 30 days to address Transition of Care Barriers: Medication Management 03/27/23 Ongoing weekly calls Patient will take all medications exactly as prescribed and will call provider for medication related questions as evidenced by patient report and health record 03/27/23 Ongoing weekly calls  and assessment Patient will attend all scheduled medical appointments: PCP appointment 03/21/23 as evidenced by patient report and health record 03/27/23 per record and patient report patient was seen 3/12 and 3/17 by PCP  and 03/22/23 by nephrology Patient will contact endocrinology office to move up scheduled appointment related to change with diabetic medications - 03/27/23 Patient reports talking to PCP about having information sent to endocrinology for appointment and was noted in office visit with PCP - reviewed keeping communication open with each provider - still needs to move up endocrinology appointment  Interventions: Evaluation of current treatment plan related to  self management and patient's adherence to plan as established by provider - 03/27/23 Ongoing weekly calls  Transitions of Care:  Goal on track:  Yes. Contacted provider for patient needs - Patient reporting non productive cough that is keeping him up at night - 03/27/23 patient reporting cough resolved with increase of lasix Assessed if patient has "shrinker" for AKA to compress for prosthetic fitting - Patient confirmed he has the shrinker - 03/27/23 remains difficult to use related to swelling  Heart Failure Interventions:  (Status:  Goal on track:  Yes.) Short Term Goal - 03/27/23 reviewed further CHF and management - patient states he is unable to use scale presently due to being unable to use prosthetic but is monitoring swelling and is mindful of other symptoms like shortness of breath, cough, swelling-patient states he continues to try to improve diet and is eating more chicken/fish - not eating red meat - patient reports he does eat a handful of Doritos a day typically - education provided to try to decrease Basic overview and discussion of pathophysiology of Heart Failure reviewed Provided education on low sodium diet - reviewed Discussed the importance of keeping all appointments with provider - 03/27/23 patient is keeping his MD appointments  Assessed social determinant of health barriers - 03/27/23 Patient reports he gets over $200/month food stamps and has card from insurance provider that helps with food  Diabetes Interventions:  (Status:   Goal on track:  Yes.) Short Term Goal Assessed patient's understanding of A1c goal: <6.5% Provided education to patient about basic DM disease process Reviewed medications with patient and discussed importance of medication adherence Reviewed scheduled/upcoming provider appointments including: Educated patient on importance of moving up his endocrinology appointment from May related to his recent medication changes Lab Results  Component Value Date   HGBA1C 5.2 12/06/2022    Patient Goals/Self-Care Activities: Participate in Transition of Care Program/Attend [facility] scheduled calls Notify RN Care Manager of TOC call rescheduling needs Take all medications as prescribed Attend all scheduled provider appointments (PCP 03/21/23)and call endocrinology to move up next appointment from May (still needs to do this) call office if I gain more than 2 pounds in one day or 5 pounds in one week keep legs up while sitting use salt in moderation watch for swelling in feet, ankles and legs every day know when to call the doctor:Gain more than 3 pounds in one day, more than  5 pounds in one week, or shortness of breath or worsening lower leg edema If your fasting blood glucose in the morning remains elevated (greater than 300mg  every morning) please give the clinic a call and your endocrinologist office for advice on changing your insulin regimen. If your fasting blood glucose level or any blood glucose reading throughout the day is below 70, please call the either our clinic or the endocrinologist office NovoLOG FlexPen 100 UNIT/ML FlexPen - Follow Sliding  Scale: Inject 0-10 Units into the skin 3 (three) times daily with meals. Glucose 141-180: inject 4 units, Glucose 181-220: inject 6 units, Glucose 221-260: inject 8 units, Glucose 261-300: inject 10 units, If glucose if greater than 300, please inject 12 units and call your PCP and/or endocrinologist office  Follow Up Plan:  Telephone follow up appointment with  care management team member scheduled for:  04/03/23 9:30am The patient has been provided with contact information for the care management team and has been advised to call with any health related questions or concerns.          Plan: Telephone follow up appointment with care management team member scheduled for: 04/03/23 9:30am The patient has been provided with contact information for the care management team and has been advised to call with any health related questions or concerns.   Hilbert Odor RN, CCM [place]  VBCI-Population Health RN Care Manager [phone]

## 2023-03-27 NOTE — Telephone Encounter (Signed)
 Attempted to call patient, not available. Will reach out again later today.

## 2023-03-28 DIAGNOSIS — E113311 Type 2 diabetes mellitus with moderate nonproliferative diabetic retinopathy with macular edema, right eye: Secondary | ICD-10-CM | POA: Diagnosis not present

## 2023-03-28 NOTE — Progress Notes (Signed)
Internal Medicine Clinic Attending  I was physically present during the key portions of the resident provided service and participated in the medical decision making of patient's management care. I reviewed pertinent patient test results.  The assessment, diagnosis, and plan were formulated together and I agree with the documentation in the resident's note.  Williams, Julie Anne, MD  

## 2023-03-28 NOTE — Progress Notes (Unsigned)
 Office Visit Note   Patient: [name]           Date of Birth: 07/24/1975           MRN: [id number] Visit Date: 03/29/2023              Requested by:  Gust Rung, DO [address]  [place],  [place] [address]  PCP: Gust Rung, DO   Assessment & Plan: Visit Diagnoses: No diagnosis found.  Plan: ***  Follow-Up Instructions: No follow-ups on file.   Orders:  No orders of the defined types were placed in this encounter. No orders of the defined types were placed in this encounter.    Procedures: No procedures performed   Clinical Data: No additional findings.   Subjective: No chief complaint on file.  HPI  Review of Systems   Objective: Vital Signs: There were no vitals taken for this visit.  Physical Exam  Ortho Exam  Specialty Comments:  No specialty comments available.  Imaging: No results found.   PMFS History: Patient Active Problem List   Diagnosis Date Noted  . Gout of right wrist 03/26/2023  . Insomnia 03/26/2023  . Volume overload 03/21/2023  . Acute hypoxic respiratory failure (HCC) 03/13/2023  . Inflammatory arthropathy 02/23/2023  . Pain and swelling of right wrist 02/22/2023  . Elbow strain, left, initial encounter 06/06/2022  . Medial epicondylitis of elbow, left 05/06/2022  . Diabetic neuropathy (HCC) 01/24/2022  . Trapezius strain, left, initial encounter 02/02/2021  . Left shoulder pain 12/30/2020  . Acute pulmonary edema (HCC)   . Benign paroxysmal positional vertigo 09/14/2020  . Anemia associated with chronic renal failure   . Hemoglobinuria 12/12/2018  . OSA (obstructive sleep apnea) 02/28/2018  . Acute on chronic diastolic heart failure (HCC) 02/02/2018  . Preventative health care 08/15/2017  . Erectile dysfunction 04/22/2017  . History of pulmonary embolus (PE) 06/09/2016  . Depression 05/04/2016  . Hypogonadism in male 05/04/2016  . Urinary retention 05/04/2016  . Dyslipidemia 08/12/2014  .  Unilateral AKA, right (HCC) 12/26/2013  . Hyperlipidemia 02/19/2013  . S/P cardiac cath, 02/18/13, normal coronaries 02/19/2013  . CKD (chronic kidney disease) stage 4, GFR 15-29 ml/min (HCC) 02/19/2013  . Hypertension 07/24/2011  . Long term current use of anticoagulant therapy 07/19/2011  . History of DVT (deep vein thrombosis) 07/28/2009  . Morbid obesity (HCC) 10/26/2005  . Insulin dependent type 2 diabetes mellitus, controlled (HCC) 01/09/2002   Past Medical History:  Diagnosis Date  . Acute venous embolism and thrombosis of deep vessels of proximal lower extremity (HCC) 07/19/2011  . Anemia   . Anginal pain (HCC)    pt denies  . Anxiety   . Chest pain, neg MI, normal coronaries by cath 02/18/2013   pt denies  . CHF (congestive heart failure) (HCC)   . Chronic renal disease, stage IV (HCC) 02/19/2013  . Colon polyps   . Depression   . Diabetic ulcer of right foot (HCC)   . DVT (deep venous thrombosis) (HCC) 09/2002   patient reports additional DVTs in '06 & '11 (unconfirmed)  . ED (erectile dysfunction)   . GERD (gastroesophageal reflux disease)   . History of blood transfusion    "related to OR" (10/31/2016)  . Hyperlipidemia 02/19/2013  . Hypertension   . Nausea & vomiting    "constant for the last couple weeks" (10/31/2016)  . Nephrotic syndrome   . Obesity    BMI 44, weight 346 pounds 01/30/14  .  OSA (obstructive sleep apnea) 02/28/2018   Mild obstructive sleep apnea with an AHI of 9.8/h but severe during REM sleep with an AHI of 33.8/h.  Oxygen saturations dropped to 86% and there was moderate snoring  . Peripheral edema   . Pneumonia   . Prosthesis adjustment 08/17/2016  . Pulmonary embolism (HCC) 09/2002   treated with 6 months of warfarin  . Pyelonephritis 02/02/2018  . Type I diabetes mellitus (HCC) dx'd 2001  . Urinary retention     Family History  Problem Relation Age of Onset  . Heart disease Mother   . Diabetes Mother   . Diabetes Maternal Uncle    . Diabetes Cousin     Past Surgical History:  Procedure Laterality Date  . AMPUTATION Right 12/22/2013   Procedure: AMPUTATION BELOW KNEE;  Surgeon: Toni Arthurs, MD;  Location: [facility] OR;  Service: Orthopedics;  Laterality: Right;  . AMPUTATION Right 02/19/2014   Procedure: RIGHT ABOVE KNEE AMPUTATION ;  Surgeon: Toni Arthurs, MD;  Location: [facility] OR;  Service: Orthopedics;  Laterality: Right;  . blood clot removal  2016  . CARDIAC CATHETERIZATION  02/18/2013   normal coronaries  . ESOPHAGOGASTRODUODENOSCOPY N/A 11/02/2016   Procedure: ESOPHAGOGASTRODUODENOSCOPY (EGD);  Surgeon: Iva Boop, MD;  Location: [facility] ENDOSCOPY;  Service: Endoscopy;  Laterality: N/A;  . I & D EXTREMITY Right 12/14/2013   Procedure: IRRIGATION AND DEBRIDEMENT RIGHT FOOT;  Surgeon: Verlee Rossetti, MD;  Location: [facility] OR;  Service: Orthopedics;  Laterality: Right;  . INCISION AND DRAINAGE ABSCESS  2007; 2015   "back"  . INCISION AND DRAINAGE OF WOUND Right 12/22/2013   Procedure: I&D RIGHT BUTTOCK;  Surgeon: Toni Arthurs, MD;  Location: [facility] OR;  Service: Orthopedics;  Laterality: Right;  . LEFT HEART CATHETERIZATION WITH CORONARY ANGIOGRAM N/A 02/18/2013   Procedure: LEFT HEART CATHETERIZATION WITH CORONARY ANGIOGRAM;  Surgeon: Peter M Swaziland, MD;  Location: [facility] CATH LAB;  Service: Cardiovascular;  Laterality: N/A;   Social History   Occupational History  . Occupation: student    Comment: [employer]  . Occupation: disability  Tobacco Use  . Smoking status: Never  . Smokeless tobacco: Never  Vaping Use  . Vaping status: Never Used  Substance and Sexual Activity  . Alcohol use: Yes    Comment: 10/31/2016 "a few beers q 6 months or so"  . Drug use: No  . Sexual activity: Yes

## 2023-03-29 ENCOUNTER — Encounter: Payer: Self-pay | Admitting: Orthopaedic Surgery

## 2023-03-29 ENCOUNTER — Ambulatory Visit: Admitting: Orthopaedic Surgery

## 2023-03-29 DIAGNOSIS — M25531 Pain in right wrist: Secondary | ICD-10-CM

## 2023-03-30 ENCOUNTER — Encounter: Admitting: Student

## 2023-03-31 DIAGNOSIS — E119 Type 2 diabetes mellitus without complications: Secondary | ICD-10-CM | POA: Diagnosis not present

## 2023-04-01 ENCOUNTER — Encounter: Payer: Self-pay | Admitting: Orthopedic Surgery

## 2023-04-01 NOTE — Progress Notes (Signed)
 Office Visit Note   Patient: [name]           Date of Birth: [birth year]           MRN: [id number] Visit Date: 03/13/2023              Requested by: Rocky Morel, DO [address] [place],  [place] [address] PCP: Gust Rung, DO  Chief Complaint  Patient presents with   Right Leg - Edema    Pt c/o of SOB at check in. States that normally on Lasix 80 mg BID and that this has recently been decreased. C/o all over swelling and SOB dyspnea upon exertion.       HPI: Patient presented with exacerbation with increased swelling in both lower extremities and shortness of breath.  Assessment & Plan: Visit Diagnoses:  1. Swelling of lower extremity     Plan: Patient had decreased O2 saturations and was sent to the emergency room via EMS.  Follow-Up Instructions: No follow-ups on file.   Ortho Exam  Patient is alert, oriented, no adenopathy, well-dressed, normal affect, normal respiratory effort. Patient had decreased O2 sats and urgent transport for EMS to [facility] emergency room was established.  Imaging: No results found. No images are attached to the encounter.  Labs: Lab Results  Component Value Date   HGBA1C 4.9 03/26/2023   HGBA1C 5.2 12/06/2022   HGBA1C 5.0 08/28/2022   ESRSEDRATE 117 (H) 02/23/2023   ESRSEDRATE 72 (H) 06/18/2018   ESRSEDRATE 57 (H) 07/12/2011   CRP 20.4 (H) 02/23/2023   CRP 8.07 (H) 07/12/2011   LABURIC 11.9 (H) 03/26/2023   LABURIC 14.8 (H) 02/23/2023   REPTSTATUS 02/26/2023 FINAL 02/23/2023   GRAMSTAIN  02/23/2023    FEW WBC PRESENT,BOTH PMN AND MONONUCLEAR NO ORGANISMS SEEN    CULT  02/23/2023    NO GROWTH 3 DAYS Performed at [facility], [address], [place], [place] [address]    LABORGA KLEBSIELLA PNEUMONIAE (A) 08/20/2020     Lab Results  Component Value Date   ALBUMIN 2.8 (L) 03/22/2023   ALBUMIN 2.8 (L) 03/16/2023   ALBUMIN 2.8 (L) 03/15/2023    Lab Results  Component Value Date   MG 2.0 03/16/2023   MG  1.8 03/15/2023   MG 1.9 03/14/2023   No results found for: "VD25OH"  No results found for: "PREALBUMIN"    Latest Ref Rng & Units 03/22/2023    9:57 AM 03/15/2023    6:58 AM 03/14/2023    7:03 AM  CBC EXTENDED  WBC 4.0 - 10.5 K/uL  7.2  9.1   RBC 4.22 - 5.81 MIL/uL  2.99  2.87   Hemoglobin 13.0 - 17.0 g/dL 8.9  8.9  8.6   HCT 82.9 - 52.0 %  27.8  27.3   Platelets 150 - 400 K/uL  143  145   NEUT# 1.7 - 7.7 K/uL   7.3   Lymph# 0.7 - 4.0 K/uL   1.0      There is no height or weight on file to calculate BMI.  Orders:  No orders of the defined types were placed in this encounter.  No orders of the defined types were placed in this encounter.    Procedures: No procedures performed  Clinical Data: No additional findings.  ROS:  All other systems negative, except as noted in the HPI. Review of Systems  Objective: Vital Signs: BP (!) 153/85 (BP Location: Right Arm, Cuff Size: Large)  Pulse (!) 105   SpO2 (!) 89%   Specialty Comments:  No specialty comments available.  PMFS History: Patient Active Problem List   Diagnosis Date Noted   Gout of right wrist 03/26/2023   Insomnia 03/26/2023   Volume overload 03/21/2023   Acute hypoxic respiratory failure (HCC) 03/13/2023   Inflammatory arthropathy 02/23/2023   Pain and swelling of right wrist 02/22/2023   Elbow strain, left, initial encounter 06/06/2022   Medial epicondylitis of elbow, left 05/06/2022   Diabetic neuropathy (HCC) 01/24/2022   Trapezius strain, left, initial encounter 02/02/2021   Left shoulder pain 12/30/2020   Acute pulmonary edema (HCC)    Benign paroxysmal positional vertigo 09/14/2020   Anemia associated with chronic renal failure    Hemoglobinuria 12/12/2018   OSA (obstructive sleep apnea) 02/28/2018   Acute on chronic diastolic heart failure (HCC) 02/02/2018   Preventative health care 08/15/2017   Erectile dysfunction 04/22/2017   History of pulmonary embolus (PE) 06/09/2016   Depression  05/04/2016   Hypogonadism in male 05/04/2016   Urinary retention 05/04/2016   Dyslipidemia 08/12/2014   Unilateral AKA, right (HCC) 12/26/2013   Hyperlipidemia 02/19/2013   S/P cardiac cath, 02/18/13, normal coronaries 02/19/2013   CKD (chronic kidney disease) stage 4, GFR 15-29 ml/min (HCC) 02/19/2013   Hypertension 07/24/2011   Long term current use of anticoagulant therapy 07/19/2011   History of DVT (deep vein thrombosis) 07/28/2009   Morbid obesity (HCC) 10/26/2005   Insulin dependent type 2 diabetes mellitus, controlled (HCC) 01/09/2002   Past Medical History:  Diagnosis Date   Acute venous embolism and thrombosis of deep vessels of proximal lower extremity (HCC) 07/19/2011   Anemia    Anginal pain (HCC)    pt denies   Anxiety    Chest pain, neg MI, normal coronaries by cath 02/18/2013   pt denies   CHF (congestive heart failure) (HCC)    Chronic renal disease, stage IV (HCC) 02/19/2013   Colon polyps    Depression    Diabetic ulcer of right foot (HCC)    DVT (deep venous thrombosis) (HCC) 09/2002   patient reports additional DVTs in '06 & '11 (unconfirmed)   ED (erectile dysfunction)    GERD (gastroesophageal reflux disease)    History of blood transfusion    "related to OR" (10/31/2016)   Hyperlipidemia 02/19/2013   Hypertension    Nausea & vomiting    "constant for the last couple weeks" (10/31/2016)   Nephrotic syndrome    Obesity    BMI 44, weight 346 pounds 01/30/14   OSA (obstructive sleep apnea) 02/28/2018   Mild obstructive sleep apnea with an AHI of 9.8/h but severe during REM sleep with an AHI of 33.8/h.  Oxygen saturations dropped to 86% and there was moderate snoring   Peripheral edema    Pneumonia    Prosthesis adjustment 08/17/2016   Pulmonary embolism (HCC) 09/2002   treated with 6 months of warfarin   Pyelonephritis 02/02/2018   Type I diabetes mellitus (HCC) dx'd 2001   Urinary retention     Family History  Problem Relation Age of Onset    Heart disease Mother    Diabetes Mother    Diabetes Maternal Uncle    Diabetes Cousin     Past Surgical History:  Procedure Laterality Date   AMPUTATION Right 12/22/2013   Procedure: AMPUTATION BELOW KNEE;  Surgeon: Toni Arthurs, MD;  Location: [facility] OR;  Service: Orthopedics;  Laterality: Right;   AMPUTATION Right 02/19/2014   Procedure: RIGHT  ABOVE KNEE AMPUTATION ;  Surgeon: Toni Arthurs, MD;  Location: [facility] OR;  Service: Orthopedics;  Laterality: Right;   blood clot removal  2016   CARDIAC CATHETERIZATION  02/18/2013   normal coronaries   ESOPHAGOGASTRODUODENOSCOPY N/A 11/02/2016   Procedure: ESOPHAGOGASTRODUODENOSCOPY (EGD);  Surgeon: Iva Boop, MD;  Location: [facility] ENDOSCOPY;  Service: Endoscopy;  Laterality: N/A;   I & D EXTREMITY Right 12/14/2013   Procedure: IRRIGATION AND DEBRIDEMENT RIGHT FOOT;  Surgeon: Verlee Rossetti, MD;  Location: [facility] OR;  Service: Orthopedics;  Laterality: Right;   INCISION AND DRAINAGE ABSCESS  2007; 2015   "back"   INCISION AND DRAINAGE OF WOUND Right 12/22/2013   Procedure: I&D RIGHT BUTTOCK;  Surgeon: Toni Arthurs, MD;  Location: [facility] OR;  Service: Orthopedics;  Laterality: Right;   LEFT HEART CATHETERIZATION WITH CORONARY ANGIOGRAM N/A 02/18/2013   Procedure: LEFT HEART CATHETERIZATION WITH CORONARY ANGIOGRAM;  Surgeon: Peter M Swaziland, MD;  Location: [facility] CATH LAB;  Service: Cardiovascular;  Laterality: N/A;   Social History   Occupational History   Occupation: student    Comment: [employer]   Occupation: disability  Tobacco Use   Smoking status: Never   Smokeless tobacco: Never  Vaping Use   Vaping status: Never Used  Substance and Sexual Activity   Alcohol use: Yes    Comment: 10/31/2016 "a few beers q 6 months or so"   Drug use: No   Sexual activity: Yes

## 2023-04-02 ENCOUNTER — Other Ambulatory Visit: Payer: Self-pay | Admitting: Student

## 2023-04-02 DIAGNOSIS — E113212 Type 2 diabetes mellitus with mild nonproliferative diabetic retinopathy with macular edema, left eye: Secondary | ICD-10-CM | POA: Diagnosis not present

## 2023-04-02 NOTE — Progress Notes (Signed)
Internal Medicine Clinic Attending  I was physically present during the key portions of the resident provided service and participated in the medical decision making of patient's management care. I reviewed pertinent patient test results.  The assessment, diagnosis, and plan were formulated together and I agree with the documentation in the resident's note.  Williams, Julie Anne, MD  

## 2023-04-02 NOTE — Telephone Encounter (Signed)
 Medication sent to pharmacy

## 2023-04-03 ENCOUNTER — Telehealth: Payer: Self-pay

## 2023-04-03 ENCOUNTER — Other Ambulatory Visit: Payer: Self-pay | Admitting: Student

## 2023-04-03 ENCOUNTER — Other Ambulatory Visit: Payer: Self-pay

## 2023-04-03 NOTE — Patient Instructions (Signed)
 Visit Information  Thank you for taking time to visit with me today. Please don't hesitate to contact me if I can be of assistance to you before our next scheduled telephone appointment.  Our next appointment is by telephone on 04/10/23 at 2pm  Following is a copy of your care plan:   Goals Addressed               This Visit's Progress     TOC Care Plan - Patient will report no readmissions in the next 30 days (pt-stated)        Current Barriers:  Knowledge deficit related to changes with medications 04/03/23 TOC RN reviewed MD visits and completed Medication reconciliation with patient who is new on allopurinol for gout  RNCM Clinical Goal(s):  Patient will work with the Care Management team over the next 30 days to address Transition of Care Barriers: Medication Management 04/03/23 Ongoing weekly calls Patient will take all medications exactly as prescribed and will call provider for medication related questions as evidenced by patient report and health record 04/03/23 Ongoing weekly calls and assessment Patient will attend all scheduled medical appointments:04/03/23 Patient had phone visit and office visit - plan to see Ortho Dr Lajoyce Corners 04/16/23 Patient will contact endocrinology office to move up scheduled appointment related to change with diabetic medications - 04/03/23 Patient reports MD decreased Evaristo Bury to 40 units  Interventions: Evaluation of current treatment plan related to  self management and patient's adherence to plan as established by provider - 04/03/23 Ongoing weekly calls  Transitions of Care:  Goal on track:  Yes. Contacted provider for patient needs - Patient reporting non productive cough that is keeping him up at night - 03/27/23 patient reporting cough resolved with increase of lasix - 04/03/23 issue resolved Assessed if patient has "shrinker" for AKA to compress for prosthetic fitting - Patient confirmed he has the shrinker - 04/03/23 remains difficult to use related to  swelling - patient reports plan to see Dr Lajoyce Corners 04/16/23 to discuss options  Heart Failure Interventions:  (Status:  Goal on track:  Yes.) Short Term Goal - 04/03/23 reviewed further CHF and management- patient was with tutor and requested a short call Basic overview and discussion of pathophysiology of Heart Failure reviewed Provided education on low sodium diet - reviewed Discussed the importance of keeping all appointments with provider - 04/03/23 patient is keeping his MD appointments  Assessed social determinant of health barriers - 03/27/23 Patient reports he gets over $200/month food stamps and has card from insurance provider that helps with food 04/03/23 no new issues reported  Diabetes Interventions:  (Status:  Goal on track:  Yes.) Short Term Goal Assessed patient's understanding of A1c goal: <6.5% Provided education to patient about basic DM disease process Reviewed medications with patient and discussed importance of medication adherence Reviewed scheduled/upcoming provider appointments including: Educated patient on importance of moving up his endocrinology appointment from May related to his recent medication changes Lab Results  Component Value Date   HGBA1C 5.2 12/06/2022    Patient Goals/Self-Care Activities: Participate in Transition of Care Program/Attend [facility] scheduled calls Notify RN Care Manager of [facility] call rescheduling needs Take all medications as prescribed Attend all scheduled provider appointments (PCP 03/21/23)and call endocrinology to move up next appointment from May - PCP has adjusted Tresiba per 04/03/23 call call office if I gain more than 2 pounds in one day or 5 pounds in one week keep legs up while sitting use salt in moderation watch for  swelling in feet, ankles and legs every day know when to call the doctor:Gain more than 3 pounds in one day, more than  5 pounds in one week, or shortness of breath or worsening lower leg edema If your fasting blood glucose in  the morning remains elevated (greater than 300mg  every morning) please give the clinic a call and your endocrinologist office for advice on changing your insulin regimen. If your fasting blood glucose level or any blood glucose reading throughout the day is below 70, please call the either our clinic or the endocrinologist office NovoLOG FlexPen 100 UNIT/ML FlexPen - Follow Sliding Scale: Inject 0-10 Units into the skin 3 (three) times daily with meals. Glucose 141-180: inject 4 units, Glucose 181-220: inject 6 units, Glucose 221-260: inject 8 units, Glucose 261-300: inject 10 units, If glucose if greater than 300, please inject 12 units and call your PCP and/or endocrinologist office  Follow Up Plan:  Telephone follow up appointment with care management team member scheduled for:  04/10/23 2pm The patient has been provided with contact information for the care management team and has been advised to call with any health related questions or concerns.          Patient verbalizes understanding of instructions and care plan provided today and agrees to view in MyChart. Active MyChart status and patient understanding of how to access instructions and care plan via MyChart confirmed with patient.     Telephone follow up appointment with care management team member scheduled for: 04/10/23 2pm  The patient has been provided with contact information for the care management team and has been advised to call with any health related questions or concerns.   Please call the care guide team at [phone] if you need to cancel or reschedule your appointment.   Please call the Suicide and Crisis Lifeline: 988 call the [place] National Suicide Prevention Lifeline: 218-511-3711 or TTY: (712)045-1275 TTY (424)016-0324) to talk to a trained counselor call 1-800-273-TALK (toll free, 24 hour hotline) call 911 if you are experiencing a Mental Health or Behavioral Health Crisis or need someone to talk to.  Hilbert Odor RN, CCM [place]  VBCI-Population Health RN Care Manager [phone]

## 2023-04-03 NOTE — Patient Outreach (Signed)
 Care Management  Transitions of Care Program Transitions of Care Post-discharge week 3   04/03/2023 Name: [name] MRN: [id number] DOB: June 18, 1975  Subjective: [name] is a 48 y.o. year old male who is a primary care patient of Gust Rung, DO. The Care Management team Engaged with patient Engaged with patient by telephone to assess and address transitions of care needs.   Consent to Services:  Patient was given information about care management services, agreed to services, and gave verbal consent to participate.   Assessment:     Patient states he is with a tutor and requested short call. Patient confirmed he has cut back Guinea-B[place]au to 40 units and was started on allopurinol. Patient reports his sugar at time of call is 55 but denied symptoms and states he just worked out and is eating something. Reviewed importance of notifying provider with low readings. Patient reports he has 04/16/23 appointment with Dr Lajoyce Corners to discuss plan for right stump so prosthesis will fit better.       SDOH Interventions    Flowsheet Row Telephone from 03/19/2023 in [place] POPULATION HEALTH DEPARTMENT Clinical Support from 01/31/2023 in [facility] - A Dept Of [place]. [facility] Telephone from 09/07/2022 in [facility] Community Care Coordination Clinical Support from 01/24/2022 in [facility] - A Dept Of [place]. [facility] Care Coordination from 10/21/2021 in [facility] Care Coordination ED to Hosp-Admission (Discharged) from 10/01/2020 in [facility] 3E HF PCU  SDOH Interventions        Food Insecurity Interventions --  [patient states he gets $208/month food stamps] Intervention Not Indicated Intervention Not Indicated Intervention Not Indicated Intervention Not Indicated Intervention Not Indicated  Housing Interventions Intervention Not Indicated Intervention Not Indicated -- -- -- Intervention Not  Indicated  Transportation Interventions Intervention Not Indicated  [Patient reports he uses [employer] [place]] Intervention Not Indicated Intervention Not Indicated Intervention Not Indicated Intervention Not Indicated Intervention Not Indicated  Utilities Interventions Intervention Not Indicated Intervention Not Indicated Intervention Not Indicated -- -- --  Alcohol Usage Interventions -- Intervention Not Indicated (Score <7) -- -- -- --  Depression Interventions/Treatment  -- PHQ2-9 Score <4 Follow-up Not Indicated -- -- -- --  Financial Strain Interventions -- Intervention Not Indicated -- -- -- Intervention Not Indicated  Physical Activity Interventions -- Intervention Not Indicated -- -- -- --  Stress Interventions -- Intervention Not Indicated -- -- -- --  Social Connections Interventions -- Intervention Not Indicated -- -- -- --  Health Literacy Interventions -- Intervention Not Indicated -- -- -- --        Goals Addressed               This Visit's Progress     TOC Care Plan - Patient will report no readmissions in the next 30 days (pt-stated)        Current Barriers:  Knowledge deficit related to changes with medications 04/03/23 TOC RN reviewed MD visits and completed Medication reconciliation with patient who is new on allopurinol for gout  RNCM Clinical Goal(s):  Patient will work with the Care Management team over the next 30 days to address Transition of Care Barriers: Medication Management 04/03/23 Ongoing weekly calls Patient will take all medications exactly as prescribed and will call provider for medication related questions as evidenced by patient report and health record 04/03/23 Ongoing weekly calls and assessment Patient will attend all scheduled medical appointments:04/03/23  Patient had phone visit and office visit - plan to see Ortho Dr Lajoyce Corners 04/16/23 Patient will contact endocrinology office to move up scheduled appointment related to change with diabetic medications -  04/03/23 Patient reports MD decreased Evaristo Bury to 40 units  Interventions: Evaluation of current treatment plan related to  self management and patient's adherence to plan as established by provider - 04/03/23 Ongoing weekly calls  Transitions of Care:  Goal on track:  Yes. Contacted provider for patient needs - Patient reporting non productive cough that is keeping him up at night - 03/27/23 patient reporting cough resolved with increase of lasix - 04/03/23 issue resolved Assessed if patient has "shrinker" for AKA to compress for prosthetic fitting - Patient confirmed he has the shrinker - 04/03/23 remains difficult to use related to swelling - patient reports plan to see Dr Lajoyce Corners 04/16/23 to discuss options  Heart Failure Interventions:  (Status:  Goal on track:  Yes.) Short Term Goal - 04/03/23 reviewed further CHF and management- patient was with tutor and requested a short call Basic overview and discussion of pathophysiology of Heart Failure reviewed Provided education on low sodium diet - reviewed Discussed the importance of keeping all appointments with provider - 04/03/23 patient is keeping his MD appointments  Assessed social determinant of health barriers - 03/27/23 Patient reports he gets over $200/month food stamps and has card from insurance provider that helps with food 04/03/23 no new issues reported  Diabetes Interventions:  (Status:  Goal on track:  Yes.) Short Term Goal Assessed patient's understanding of A1c goal: <6.5% Provided education to patient about basic DM disease process Reviewed medications with patient and discussed importance of medication adherence Reviewed scheduled/upcoming provider appointments including: Educated patient on importance of moving up his endocrinology appointment from May related to his recent medication changes Lab Results  Component Value Date   HGBA1C 5.2 12/06/2022    Patient Goals/Self-Care Activities: Participate in Transition of Care  Program/Attend [facility] scheduled calls Notify RN Care Manager of [facility] call rescheduling needs Take all medications as prescribed Attend all scheduled provider appointments (PCP 03/21/23)and call endocrinology to move up next appointment from May - PCP has adjusted Tresiba per 04/03/23 call call office if I gain more than 2 pounds in one day or 5 pounds in one week keep legs up while sitting use salt in moderation watch for swelling in feet, ankles and legs every day know when to call the doctor:Gain more than 3 pounds in one day, more than  5 pounds in one week, or shortness of breath or worsening lower leg edema If your fasting blood glucose in the morning remains elevated (greater than 300mg  every morning) please give the clinic a call and your endocrinologist office for advice on changing your insulin regimen. If your fasting blood glucose level or any blood glucose reading throughout the day is below 70, please call the either our clinic or the endocrinologist office NovoLOG FlexPen 100 UNIT/ML FlexPen - Follow Sliding Scale: Inject 0-10 Units into the skin 3 (three) times daily with meals. Glucose 141-180: inject 4 units, Glucose 181-220: inject 6 units, Glucose 221-260: inject 8 units, Glucose 261-300: inject 10 units, If glucose if greater than 300, please inject 12 units and call your PCP and/or endocrinologist office  Follow Up Plan:  Telephone follow up appointment with care management team member scheduled for:  04/10/23 2pm The patient has been provided with contact information for the care management team and has been advised to call with any  health related questions or concerns.          Plan: Telephone follow up appointment with care management team member scheduled for: 04/10/23 2pm The patient has been provided with contact information for the care management team and has been advised to call with any health related questions or concerns.   Hilbert Odor RN, CCM [place]   VBCI-Population Health RN Care Manager [phone]

## 2023-04-05 ENCOUNTER — Encounter: Payer: Medicare HMO | Admitting: Student

## 2023-04-10 ENCOUNTER — Telehealth: Payer: Self-pay

## 2023-04-10 ENCOUNTER — Other Ambulatory Visit: Payer: Self-pay

## 2023-04-10 NOTE — Patient Outreach (Signed)
 Care Management  Transitions of Care Program Transitions of Care Post-discharge week 4   04/10/2023 Name: [name] MRN: [id number] DOB: 05-15-1975  Subjective: Matvey Llanas is a 48 y.o. year old male who is a primary care patient of Gust Rung, DO. The Care Management team Engaged with patient Engaged with patient by telephone to assess and address transitions of care needs.   Consent to Services:  Patient was given information about care management services, agreed to services, and gave verbal consent to participate.   Assessment:     Patient states he is doing well and taking his tresiba with good results, 03/26/23 A1C 4.9 and sugar reading during call reported to be 97. Patient has appointment with Dr Lajoyce Corners 04/16/23 to discuss options for skin removal so his prosthesis will be better fitting. Patient agreeable to additional follow up after he sees Dr Lajoyce Corners.       SDOH Interventions    Flowsheet Row Telephone from 03/19/2023 in [place] POPULATION HEALTH DEPARTMENT Clinical Support from 01/31/2023 in [facility] - A Dept Of [place]. [facility] Telephone from 09/07/2022 in [facility] Care Coordination Clinical Support from 01/24/2022 in [facility] Internal [facility]tr - A Dept Of [place]. [facility] Care Coordination from 10/21/2021 in [facility] Care Coordination ED to Hosp-Admission (Discharged) from 10/01/2020 in [facility] 3E HF PCU  SDOH Interventions        Food Insecurity Interventions --  [patient states he gets $208/month food stamps] Intervention Not Indicated Intervention Not Indicated Intervention Not Indicated Intervention Not Indicated Intervention Not Indicated  Housing Interventions Intervention Not Indicated Intervention Not Indicated -- -- -- Intervention Not Indicated  Transportation Interventions Intervention Not Indicated  [Patient reports he uses [employer] [place]] Intervention Not  Indicated Intervention Not Indicated Intervention Not Indicated Intervention Not Indicated Intervention Not Indicated  Utilities Interventions Intervention Not Indicated Intervention Not Indicated Intervention Not Indicated -- -- --  Alcohol Usage Interventions -- Intervention Not Indicated (Score <7) -- -- -- --  Depression Interventions/Treatment  -- PHQ2-9 Score <4 Follow-up Not Indicated -- -- -- --  Financial Strain Interventions -- Intervention Not Indicated -- -- -- Intervention Not Indicated  Physical Activity Interventions -- Intervention Not Indicated -- -- -- --  Stress Interventions -- Intervention Not Indicated -- -- -- --  Social Connections Interventions -- Intervention Not Indicated -- -- -- --  Health Literacy Interventions -- Intervention Not Indicated -- -- -- --        Goals Addressed               This Visit's Progress     TOC Care Plan - Patient will report no readmissions in the next 30 days (pt-stated)        Current Barriers:  Knowledge deficit related to changes with medications 04/10/23 TOC RN completed Medication reconciliation with patient who denied any medication issues/concerns  RNCM Clinical Goal(s):  Patient will work with the Care Management team over the next 30 days to address Transition of Care Barriers: Medication Management 04/10/23 Ongoing weekly calls Patient will take all medications exactly as prescribed and will call provider for medication related questions as evidenced by patient report and health record 04/10/23 Ongoing weekly calls Patient will attend all scheduled medical appointments:04/10/23 Ongoing weekly calls-  plan to see Ortho Dr Lajoyce Corners 04/16/23 to discuss skin removal for better fitting prosthesis Patient will contact endocrinology office to move up scheduled appointment  related to change with diabetic medications - 04/10/23 Patient reports Evaristo Bury 45 units and still has endo appointment 05/15/23  Interventions: Evaluation of current treatment  plan related to  self management and patient's adherence to plan as established by provider - 04/10/23 Ongoing weekly calls  Transitions of Care:  Goal on track:  Yes. Contacted provider for patient needs - Patient reporting non productive cough that is keeping him up at night - 03/27/23 patient reporting cough resolved with increase of lasix - 04/03/23 issue resolved Assessed if patient has "shrinker" for AKA to compress for prosthetic fitting - Patient confirmed he has the shrinker - 04/10/23 remains difficult to use related to swelling (patient reports all swelling has resolved) - patient reports plan to see Dr Lajoyce Corners 04/16/23 to discuss options including skin removal  Heart Failure Interventions:  (Status:  Goal on track:  Yes.) Short Term Goal -04/10/23 Ongoing weekly calls Basic overview and discussion of pathophysiology of Heart Failure reviewed Provided education on low sodium diet - reviewed Discussed the importance of keeping all appointments with provider - 04/10/23 patient is keeping his MD appointments  Assessed social determinant of health barriers - 03/27/23 Patient reports he gets over $200/month food stamps and has card from insurance provider that helps with food 04/10/23 no new issues reported  Diabetes Interventions:  (Status:  Goal on track:  Yes.) Short Term Goal Assessed patient's understanding of A1c goal: <6.5% Provided education to patient about basic DM disease process Reviewed medications with patient and discussed importance of medication adherence Reviewed scheduled/upcoming provider appointments including: Educated patient on importance of moving up his endocrinology appointment from May related to his recent medication changes Lab Results  Component Value Date   HGBA1C 5.2 12/06/2022  *03/26/23 A1C now 4.9   Patient Goals/Self-Care Activities: Participate in Transition of Care Program/Attend [facility] scheduled calls Notify RN Care Manager of TOC call rescheduling needs Take all  medications as prescribed Attend all scheduled provider appointments (PCP 03/21/23)and call endocrinology to move up next appointment from May - Patient has appointment with Dr Lajoyce Corners 4/7 and has not called endocrinology to move appointment up from 05/15/23 but states A1C is now 4.9 and he is taking [place] as PCP instructed call office if I gain more than 2 pounds in one day or 5 pounds in one week keep legs up while sitting use salt in moderation watch for swelling in feet, ankles and legs every day know when to call the doctor:Gain more than 3 pounds in one day, more than  5 pounds in one week, or shortness of breath or worsening lower leg edema If your fasting blood glucose in the morning remains elevated (greater than 300mg  every morning) please give the clinic a call and your endocrinologist office for advice on changing your insulin regimen. If your fasting blood glucose level or any blood glucose reading throughout the day is below 70, please call the either our clinic or the endocrinologist office NovoLOG FlexPen 100 UNIT/ML FlexPen - Follow Sliding Scale: Inject 0-10 Units into the skin 3 (three) times daily with meals. Glucose 141-180: inject 4 units, Glucose 181-220: inject 6 units, Glucose 221-260: inject 8 units, Glucose 261-300: inject 10 units, If glucose if greater than 300, please inject 12 units and call your PCP and/or endocrinologist office  Follow Up Plan:  Telephone follow up appointment with care management team member scheduled for:  04/17/23 3pm The patient has been provided with contact information for the care management team and has been advised  to call with any health related questions or concerns.          Plan: Telephone follow up appointment with care management team member scheduled for: 04/17/23 3pm The patient has been provided with contact information for the care management team and has been advised to call with any health related questions or concerns.   Hilbert Odor RN, CCM [place]  VBCI-Population Health RN Care Manager [phone]

## 2023-04-10 NOTE — Patient Outreach (Signed)
 Care Management  Transitions of Care Program Transitions of Care Post-discharge week 4  04/10/2023 Name: [name] MRN: [id number] DOB: [birth year]  Subjective: [name] is a 48 y.o. year old male who is a primary care patient of Gust Rung, DO. The Care Management team was unable to reach the patient by phone to assess and address transitions of care needs.   Plan: Additional outreach attempts will be made to reach the patient enrolled in the [facility] Program (Post Inpatient/ED Visit).  Hilbert Odor RN, CCM [place]  VBCI-Population Health RN Care Manager [phone]

## 2023-04-12 ENCOUNTER — Ambulatory Visit (HOSPITAL_COMMUNITY)
Admission: RE | Admit: 2023-04-12 | Discharge: 2023-04-12 | Disposition: A | Discharge status: Home / Self Care | Source: Ambulatory Visit | Attending: Nephrology | Admitting: Nephrology

## 2023-04-12 ENCOUNTER — Encounter (HOSPITAL_COMMUNITY): Payer: Self-pay

## 2023-04-12 VITALS — BP 135/69 | HR 90 | Temp 97.5°F | Resp 17

## 2023-04-12 DIAGNOSIS — N184 Chronic kidney disease, stage 4 (severe): Secondary | ICD-10-CM | POA: Insufficient documentation

## 2023-04-12 LAB — RENAL FUNCTION PANEL
Albumin: 3.2 g/dL — ABNORMAL LOW (ref 3.5–5.0)
Anion gap: 11 (ref 5–15)
BUN: 53 mg/dL — ABNORMAL HIGH (ref 6–20)
CO2: 21 mmol/L — ABNORMAL LOW (ref 22–32)
Calcium: 8.7 mg/dL — ABNORMAL LOW (ref 8.9–10.3)
Chloride: 104 mmol/L (ref 98–111)
Creatinine, Ser: 3.17 mg/dL — ABNORMAL HIGH (ref 0.61–1.24)
GFR, Estimated: 24 mL/min — ABNORMAL LOW (ref >60)
Glucose, Bld: 123 mg/dL — ABNORMAL HIGH (ref 70–99)
Phosphorus: 3.8 mg/dL (ref 2.5–4.6)
Potassium: 4.1 mmol/L (ref 3.5–5.1)
Sodium: 136 mmol/L (ref 135–145)

## 2023-04-12 LAB — IRON AND TIBC
Iron: 71 ug/dL (ref 45–182)
Saturation Ratios: 27 % (ref 17.9–39.5)
TIBC: 267 ug/dL (ref 250–450)
UIBC: 196 ug/dL

## 2023-04-12 LAB — POCT HEMOGLOBIN-HEMACUE: Hemoglobin: 9.4 g/dL — ABNORMAL LOW (ref 13.0–17.0)

## 2023-04-12 LAB — FERRITIN: Ferritin: 513 ng/mL — ABNORMAL HIGH (ref 24–336)

## 2023-04-12 MED ORDER — DARBEPOETIN ALFA 200 MCG/0.4ML IJ SOSY
PREFILLED_SYRINGE | INTRAMUSCULAR | Status: AC
  Administered 2023-04-12: 200 ug via SUBCUTANEOUS
  Filled 2023-04-12: qty 0.4

## 2023-04-12 MED ORDER — DARBEPOETIN ALFA 200 MCG/0.4ML IJ SOSY: 200.0000 ug | PREFILLED_SYRINGE | INTRAMUSCULAR | Status: DC

## 2023-04-15 ENCOUNTER — Other Ambulatory Visit: Payer: Self-pay | Admitting: Student

## 2023-04-16 ENCOUNTER — Ambulatory Visit (INDEPENDENT_AMBULATORY_CARE_PROVIDER_SITE_OTHER): Admitting: Orthopedic Surgery

## 2023-04-16 ENCOUNTER — Encounter: Payer: Self-pay | Admitting: Orthopedic Surgery

## 2023-04-16 DIAGNOSIS — S78111A Complete traumatic amputation at level between right hip and knee, initial encounter: Secondary | ICD-10-CM | POA: Diagnosis not present

## 2023-04-16 DIAGNOSIS — I890 Lymphedema, not elsewhere classified: Secondary | ICD-10-CM | POA: Diagnosis not present

## 2023-04-16 NOTE — Progress Notes (Signed)
 Office Visit Note   Patient: [name]           Date of Birth: Oct 08, 1975           MRN: [id number] Visit Date: 04/16/2023              Requested by: Gust Rung, DO [address]  [place],  [place] [address] PCP: Gust Rung, DO  Chief Complaint  Patient presents with   Right Leg - Follow-up      HPI: Patient is a 48 year old gentleman who was seen for evaluation for lymphedema swelling right above-knee amputation.  Patient states that despite wearing stump shrinker's he has increasing redundant tissue and cannot wear his prosthesis.  He is ambulating in a wheelchair.  Assessment & Plan: Visit Diagnoses:  1. Unilateral AKA, right (HCC)   2. Lymphedema     Plan: Will plan for revision of the left above-knee amputation.  Risks and benefits were discussed including risk of recurrent swelling and redundant tissue secondary to lymphatic insufficiency.  Patient states he understands wished to proceed at this time.  Follow-Up Instructions: Return if symptoms worsen or fail to improve.   Ortho Exam  Patient is alert, oriented, no adenopathy, well-dressed, normal affect, normal respiratory effort. Examination of the right above-knee amputation patient has increased redundant tissue with lymphatic insufficiency.  Patient is unable to wear of the socket secondary to the increased redundant tissue from lymphedema.  There is no cellulitis no signs of infection.  No open ulcers.  Imaging: No results found. No images are attached to the encounter.  Labs: Lab Results  Component Value Date   HGBA1C 4.9 03/26/2023   HGBA1C 5.2 12/06/2022   HGBA1C 5.0 08/28/2022   ESRSEDRATE 117 (H) 02/23/2023   ESRSEDRATE 72 (H) 06/18/2018   ESRSEDRATE 57 (H) 07/12/2011   CRP 20.4 (H) 02/23/2023   CRP 8.07 (H) 07/12/2011   LABURIC 11.9 (H) 03/26/2023   LABURIC 14.8 (H) 02/23/2023   REPTSTATUS 02/26/2023 FINAL 02/23/2023   GRAMSTAIN  02/23/2023    FEW WBC PRESENT,BOTH PMN AND  MONONUCLEAR NO ORGANISMS SEEN    CULT  02/23/2023    NO GROWTH 3 DAYS Performed at [facility], [address] [place], [place] [address]    LABORGA KLEBSIELLA PNEUMONIAE (A) 08/20/2020     Lab Results  Component Value Date   ALBUMIN 3.2 (L) 04/12/2023   ALBUMIN 2.8 (L) 03/22/2023   ALBUMIN 2.8 (L) 03/16/2023    Lab Results  Component Value Date   MG 2.0 03/16/2023   MG 1.8 03/15/2023   MG 1.9 03/14/2023   No results found for: "VD25OH"  No results found for: "PREALBUMIN"    Latest Ref Rng & Units 04/12/2023    8:34 AM 03/22/2023    9:57 AM 03/15/2023    6:58 AM  CBC EXTENDED  WBC 4.0 - 10.5 K/uL   7.2   RBC 4.22 - 5.81 MIL/uL   2.99   Hemoglobin 13.0 - 17.0 g/dL 9.4  8.9  8.9   HCT 82.9 - 52.0 %   27.8   Platelets 150 - 400 K/uL   143      There is no height or weight on file to calculate BMI.  Orders:  No orders of the defined types were placed in this encounter.  No orders of the defined types were placed in this encounter.    Procedures: No procedures performed  Clinical Data: No additional findings.  ROS:  All other systems negative, except as noted in the HPI. Review of Systems  Objective: Vital Signs: There were no vitals taken for this visit.  Specialty Comments:  No specialty comments available.  PMFS History: Patient Active Problem List   Diagnosis Date Noted   Gout of right wrist 03/26/2023   Insomnia 03/26/2023   Volume overload 03/21/2023   Acute hypoxic respiratory failure (HCC) 03/13/2023   Inflammatory arthropathy 02/23/2023   Pain and swelling of right wrist 02/22/2023   Elbow strain, left, initial encounter 06/06/2022   Medial epicondylitis of elbow, left 05/06/2022   Diabetic neuropathy (HCC) 01/24/2022   Trapezius strain, left, initial encounter 02/02/2021   Left shoulder pain 12/30/2020   Acute pulmonary edema (HCC)    Benign paroxysmal positional vertigo 09/14/2020   Anemia associated with chronic renal failure     Hemoglobinuria 12/12/2018   OSA (obstructive sleep apnea) 02/28/2018   Acute on chronic diastolic heart failure (HCC) 02/02/2018   Preventative health care 08/15/2017   Erectile dysfunction 04/22/2017   History of pulmonary embolus (PE) 06/09/2016   Depression 05/04/2016   Hypogonadism in male 05/04/2016   Urinary retention 05/04/2016   Dyslipidemia 08/12/2014   Unilateral AKA, right (HCC) 12/26/2013   Hyperlipidemia 02/19/2013   S/P cardiac cath, 02/18/13, normal coronaries 02/19/2013   CKD (chronic kidney disease) stage 4, GFR 15-29 ml/min (HCC) 02/19/2013   Hypertension 07/24/2011   Long term current use of anticoagulant therapy 07/19/2011   History of DVT (deep vein thrombosis) 07/28/2009   Morbid obesity (HCC) 10/26/2005   Insulin dependent type 2 diabetes mellitus, controlled (HCC) 01/09/2002   Past Medical History:  Diagnosis Date   Acute venous embolism and thrombosis of deep vessels of proximal lower extremity (HCC) 07/19/2011   Anemia    Anginal pain (HCC)    pt denies   Anxiety    Chest pain, neg MI, normal coronaries by cath 02/18/2013   pt denies   CHF (congestive heart failure) (HCC)    Chronic renal disease, stage IV (HCC) 02/19/2013   Colon polyps    Depression    Diabetic ulcer of right foot (HCC)    DVT (deep venous thrombosis) (HCC) 09/2002   patient reports additional DVTs in '06 & '11 (unconfirmed)   ED (erectile dysfunction)    GERD (gastroesophageal reflux disease)    History of blood transfusion    "related to OR" (10/31/2016)   Hyperlipidemia 02/19/2013   Hypertension    Nausea & vomiting    "constant for the last couple weeks" (10/31/2016)   Nephrotic syndrome    Obesity    BMI 44, weight 346 pounds 01/30/14   OSA (obstructive sleep apnea) 02/28/2018   Mild obstructive sleep apnea with an AHI of 9.8/h but severe during REM sleep with an AHI of 33.8/h.  Oxygen saturations dropped to 86% and there was moderate snoring   Peripheral edema     Pneumonia    Prosthesis adjustment 08/17/2016   Pulmonary embolism (HCC) 09/2002   treated with 6 months of warfarin   Pyelonephritis 02/02/2018   Type I diabetes mellitus (HCC) dx'd 2001   Urinary retention     Family History  Problem Relation Age of Onset   Heart disease Mother    Diabetes Mother    Diabetes Maternal Uncle    Diabetes Cousin     Past Surgical History:  Procedure Laterality Date   AMPUTATION Right 12/22/2013   Procedure: AMPUTATION BELOW KNEE;  Surgeon: Toni Arthurs, MD;  [facility]  OR;  Service: Orthopedics;  Laterality: Right;   AMPUTATION Right 02/19/2014   Procedure: RIGHT ABOVE KNEE AMPUTATION ;  Surgeon: Toni Arthurs, MD;  Location: [facility] OR;  Service: Orthopedics;  Laterality: Right;   blood clot removal  2016   CARDIAC CATHETERIZATION  02/18/2013   normal coronaries   ESOPHAGOGASTRODUODENOSCOPY N/A 11/02/2016   Procedure: ESOPHAGOGASTRODUODENOSCOPY (EGD);  Surgeon: Iva Boop, MD;  Location: [facility] ENDOSCOPY;  Service: Endoscopy;  Laterality: N/A;   I & D EXTREMITY Right 12/14/2013   Procedure: IRRIGATION AND DEBRIDEMENT RIGHT FOOT;  Surgeon: Verlee Rossetti, MD;  Location: [facility] OR;  Service: Orthopedics;  Laterality: Right;   INCISION AND DRAINAGE ABSCESS  2007; 2015   "back"   INCISION AND DRAINAGE OF WOUND Right 12/22/2013   Procedure: I&D RIGHT BUTTOCK;  Surgeon: Toni Arthurs, MD;  Location: [facility] OR;  Service: Orthopedics;  Laterality: Right;   LEFT HEART CATHETERIZATION WITH CORONARY ANGIOGRAM N/A 02/18/2013   Procedure: LEFT HEART CATHETERIZATION WITH CORONARY ANGIOGRAM;  Surgeon: Peter M Swaziland, MD;  Location: [facility] CATH LAB;  Service: Cardiovascular;  Laterality: N/A;   Social History   Occupational History   Occupation: student    Comment: [employer]   Occupation: disability  Tobacco Use   Smoking status: Never   Smokeless tobacco: Never  Vaping Use   Vaping status: Never Used  Substance and Sexual Activity   Alcohol use: Yes    Comment: 10/31/2016 "a few  beers q 6 months or so"   Drug use: No   Sexual activity: Yes

## 2023-04-17 ENCOUNTER — Telehealth: Payer: Self-pay

## 2023-04-17 ENCOUNTER — Other Ambulatory Visit: Payer: Self-pay

## 2023-04-17 DIAGNOSIS — Z794 Long term (current) use of insulin: Secondary | ICD-10-CM | POA: Diagnosis not present

## 2023-04-17 DIAGNOSIS — N184 Chronic kidney disease, stage 4 (severe): Secondary | ICD-10-CM | POA: Diagnosis not present

## 2023-04-17 DIAGNOSIS — N2581 Secondary hyperparathyroidism of renal origin: Secondary | ICD-10-CM | POA: Diagnosis not present

## 2023-04-17 DIAGNOSIS — E876 Hypokalemia: Secondary | ICD-10-CM | POA: Diagnosis not present

## 2023-04-17 DIAGNOSIS — E1122 Type 2 diabetes mellitus with diabetic chronic kidney disease: Secondary | ICD-10-CM | POA: Diagnosis not present

## 2023-04-17 DIAGNOSIS — I5030 Unspecified diastolic (congestive) heart failure: Secondary | ICD-10-CM | POA: Diagnosis not present

## 2023-04-17 DIAGNOSIS — D631 Anemia in chronic kidney disease: Secondary | ICD-10-CM | POA: Diagnosis not present

## 2023-04-17 DIAGNOSIS — I130 Hypertensive heart and chronic kidney disease with heart failure and stage 1 through stage 4 chronic kidney disease, or unspecified chronic kidney disease: Secondary | ICD-10-CM | POA: Diagnosis not present

## 2023-04-17 NOTE — Transitions of Care (Post Inpatient/ED Visit) (Signed)
   04/17/2023  Name: [name] MRN: [id number] DOB: 1975-11-07  Today's TOC Week #5    Attempt to reach the patient for TOC Week #5.unsuccessful.   Follow Up Plan: Additional outreach attempts will be made to reach the patient to complete the Transitions of Care (Post Inpatient/ED visit) call.   Hilbert Odor RN, CCM [place]  VBCI-Population Health RN Care Manager [phone]

## 2023-04-19 ENCOUNTER — Telehealth: Payer: Self-pay | Admitting: Orthopedic Surgery

## 2023-04-19 ENCOUNTER — Encounter (HOSPITAL_COMMUNITY)

## 2023-04-19 NOTE — Telephone Encounter (Signed)
 I called [name] to discuss scheduling his right above knee amputation revision.  He advised me that he had a lot of things going on right now and he was dealing with finals and then graduation.  He would like to call us when he is ready to schedule surgery.

## 2023-04-24 ENCOUNTER — Other Ambulatory Visit (HOSPITAL_COMMUNITY): Payer: Self-pay

## 2023-04-25 ENCOUNTER — Encounter (HOSPITAL_COMMUNITY): Payer: Self-pay

## 2023-04-25 ENCOUNTER — Ambulatory Visit: Payer: Self-pay

## 2023-04-25 ENCOUNTER — Other Ambulatory Visit (HOSPITAL_COMMUNITY): Payer: Self-pay

## 2023-04-25 ENCOUNTER — Telehealth: Payer: Self-pay | Admitting: Pharmacy Technician

## 2023-04-25 NOTE — Telephone Encounter (Signed)
 Chief Complaint: R arm pain Symptoms: R arm pain and swelling Frequency: since Monday 4/14 Pertinent Negatives: Patient denies fever, rash, CP, SOB Disposition: [x] ED /[] Urgent Care (no appt availability in office) / [] Appointment(In office/virtual)/ []  [facility]/ [] Home Care/ [x] Refused Recommended Disposition /[] [facility]/ []  Follow-up with PCP Additional Notes: Pt reports 10/10 R arm pain from his wrist to shoulder with hx of gout. Pt states he has swelling to his R hand and wrist. Pt states he has tried Tramadol with no effect. Pt states he has limited use of his arm and hand. Pt states he would not be able to hold a glass of water but can hold smaller items. Pt denies CP or SOB. RN advised pt he should go to the ED for severe pain. Pt declined and stated due to classes he can't be seen anywhere today or tomorrow. Pt asked to be seen on Friday. RN called the [facility] to inquire about Friday availability. [facility] states they are closed this Friday for the holiday. RN offered a virtual afternoon visit tomorrow, pt declined and states he wants an in-person exam. Pt states he will be at the infusion center tomorrow at 10AM for an infusion and asked the RN if a doctor can meet him there to examine him. RN advised pt RN would relay the concern to the office. RN advised pt he should go to the ED or an UC given his severe pain. Pt verbalized understanding.  Please follow-up with the pt.    Copied from CRM [id number]. Topic: Clinical - Red Word Triage >> Apr 25, 2023  8:41 AM Everette Rank wrote: Red Word that prompted transfer to Nurse Triage:  gout issues-RT arm From Wrist to shoulder pain since 04/14 Reason for Disposition  [1] SEVERE pain AND [2] not improved 2 hours after pain medicine  Answer Assessment - Initial Assessment Questions 1. ONSET: "When did the pain start?"     Monday afternoon 4/14 2. LOCATION: "Where is the pain located?"     R arm - wrist to shoulder 3. PAIN: "How bad  is the pain?" (Scale 1-10; or mild, moderate, severe)   - MILD (1-3): Doesn't interfere with normal activities.   - MODERATE (4-7): Interferes with normal activities (e.g., work or school) or awakens from sleep.   - SEVERE (8-10): Excruciating pain, unable to do any normal activities, unable to hold a cup of water.     10/10 - could not hold a cup of water, could hold something smaller 4. WORK OR EXERCISE: "Has there been any recent work or exercise that involved this part of the body?"     States he went to the gym on Monday 5. CAUSE: "What do you think is causing the arm pain?"     Gout  6. OTHER SYMPTOMS: "Do you have any other symptoms?" (e.g., neck pain, swelling, rash, fever, numbness, weakness)     Swelling in hand and wrist. Pt states he can move his arm but it hurts. "Some tingling" in my fingers, started with pain. Denies fever or chills. Denies rash. Pt states he took Tramadol which is not helping. No CP or SOB. States he can't make a fist d/t swelling.  Protocols used: Arm Pain-A-AH

## 2023-04-25 NOTE — Telephone Encounter (Signed)
 Pharmacy Patient Advocate Encounter   Received notification from CoverMyMeds that prior authorization for FreeStyle Libre 3 Sensor is required/requested.   Insurance verification completed.   The patient is insured through [facility] .   Per test claim: PA required; PA submitted to above mentioned insurance via CoverMyMeds Key/confirmation #/EOC [id number] Status is pending

## 2023-04-25 NOTE — Telephone Encounter (Signed)
 Called pt to let him know a doctor will not be available to see him at th infusion ctr tomorrow ; schedule  telehealth appt or UC per Dr Jarvis Mesa. Pt  stated he will come to the office tomorrow am; I told him he will needs ana appt. Appt scheduled with Dr Roddy Citizen tomorrow 4/17 @ 0915 AM.

## 2023-04-26 ENCOUNTER — Encounter (HOSPITAL_COMMUNITY): Payer: Self-pay | Admitting: *Deleted

## 2023-04-26 ENCOUNTER — Inpatient Hospital Stay (HOSPITAL_COMMUNITY)
Admission: EM | Admit: 2023-04-26 | Discharge: 2023-05-01 | DRG: 554 | Disposition: A | Discharge status: Home / Self Care | Source: Ambulatory Visit | Attending: Internal Medicine | Admitting: Internal Medicine

## 2023-04-26 ENCOUNTER — Ambulatory Visit: Admitting: Student

## 2023-04-26 ENCOUNTER — Other Ambulatory Visit (HOSPITAL_COMMUNITY): Payer: Self-pay

## 2023-04-26 ENCOUNTER — Emergency Department (HOSPITAL_COMMUNITY)

## 2023-04-26 ENCOUNTER — Encounter (HOSPITAL_COMMUNITY)
Admission: RE | Admit: 2023-04-26 | Discharge: 2023-04-26 | Disposition: A | Discharge status: Home / Self Care | Source: Ambulatory Visit | Attending: Nephrology | Admitting: Nephrology

## 2023-04-26 ENCOUNTER — Other Ambulatory Visit: Payer: Self-pay

## 2023-04-26 VITALS — BP 130/70 | HR 92 | Temp 98.5°F | Ht 74.0 in | Wt 316.0 lb

## 2023-04-26 VITALS — BP 142/69 | HR 91 | Temp 97.4°F | Resp 18

## 2023-04-26 DIAGNOSIS — R531 Weakness: Secondary | ICD-10-CM | POA: Diagnosis not present

## 2023-04-26 DIAGNOSIS — Z888 Allergy status to other drugs, medicaments and biological substances status: Secondary | ICD-10-CM

## 2023-04-26 DIAGNOSIS — E11649 Type 2 diabetes mellitus with hypoglycemia without coma: Secondary | ICD-10-CM | POA: Diagnosis not present

## 2023-04-26 DIAGNOSIS — Z89611 Acquired absence of right leg above knee: Secondary | ICD-10-CM | POA: Diagnosis not present

## 2023-04-26 DIAGNOSIS — Z993 Dependence on wheelchair: Secondary | ICD-10-CM

## 2023-04-26 DIAGNOSIS — Z7901 Long term (current) use of anticoagulants: Secondary | ICD-10-CM

## 2023-04-26 DIAGNOSIS — K219 Gastro-esophageal reflux disease without esophagitis: Secondary | ICD-10-CM | POA: Diagnosis not present

## 2023-04-26 DIAGNOSIS — Z86718 Personal history of other venous thrombosis and embolism: Secondary | ICD-10-CM | POA: Diagnosis not present

## 2023-04-26 DIAGNOSIS — M19011 Primary osteoarthritis, right shoulder: Secondary | ICD-10-CM | POA: Diagnosis not present

## 2023-04-26 DIAGNOSIS — E1140 Type 2 diabetes mellitus with diabetic neuropathy, unspecified: Secondary | ICD-10-CM | POA: Diagnosis present

## 2023-04-26 DIAGNOSIS — I130 Hypertensive heart and chronic kidney disease with heart failure and stage 1 through stage 4 chronic kidney disease, or unspecified chronic kidney disease: Secondary | ICD-10-CM | POA: Diagnosis not present

## 2023-04-26 DIAGNOSIS — M7591 Shoulder lesion, unspecified, right shoulder: Secondary | ICD-10-CM | POA: Diagnosis present

## 2023-04-26 DIAGNOSIS — R600 Localized edema: Secondary | ICD-10-CM | POA: Diagnosis not present

## 2023-04-26 DIAGNOSIS — M109 Gout, unspecified: Secondary | ICD-10-CM | POA: Diagnosis not present

## 2023-04-26 DIAGNOSIS — Z794 Long term (current) use of insulin: Secondary | ICD-10-CM | POA: Diagnosis not present

## 2023-04-26 DIAGNOSIS — E1122 Type 2 diabetes mellitus with diabetic chronic kidney disease: Secondary | ICD-10-CM | POA: Diagnosis not present

## 2023-04-26 DIAGNOSIS — I5032 Chronic diastolic (congestive) heart failure: Secondary | ICD-10-CM | POA: Diagnosis not present

## 2023-04-26 DIAGNOSIS — N184 Chronic kidney disease, stage 4 (severe): Secondary | ICD-10-CM | POA: Insufficient documentation

## 2023-04-26 DIAGNOSIS — M79601 Pain in right arm: Secondary | ICD-10-CM | POA: Diagnosis present

## 2023-04-26 DIAGNOSIS — Z7985 Long-term (current) use of injectable non-insulin antidiabetic drugs: Secondary | ICD-10-CM

## 2023-04-26 DIAGNOSIS — K5900 Constipation, unspecified: Secondary | ICD-10-CM | POA: Diagnosis not present

## 2023-04-26 DIAGNOSIS — Z86711 Personal history of pulmonary embolism: Secondary | ICD-10-CM | POA: Diagnosis not present

## 2023-04-26 DIAGNOSIS — Z79899 Other long term (current) drug therapy: Secondary | ICD-10-CM | POA: Diagnosis not present

## 2023-04-26 DIAGNOSIS — M1288 Other specific arthropathies, not elsewhere classified, other specified site: Secondary | ICD-10-CM | POA: Diagnosis not present

## 2023-04-26 DIAGNOSIS — M79641 Pain in right hand: Principal | ICD-10-CM

## 2023-04-26 DIAGNOSIS — S78111A Complete traumatic amputation at level between right hip and knee, initial encounter: Secondary | ICD-10-CM | POA: Diagnosis present

## 2023-04-26 DIAGNOSIS — M1990 Unspecified osteoarthritis, unspecified site: Secondary | ICD-10-CM | POA: Diagnosis not present

## 2023-04-26 DIAGNOSIS — R339 Retention of urine, unspecified: Secondary | ICD-10-CM | POA: Diagnosis not present

## 2023-04-26 DIAGNOSIS — E119 Type 2 diabetes mellitus without complications: Secondary | ICD-10-CM

## 2023-04-26 DIAGNOSIS — M65931 Unspecified synovitis and tenosynovitis, right forearm: Secondary | ICD-10-CM | POA: Diagnosis not present

## 2023-04-26 DIAGNOSIS — Z6841 Body Mass Index (BMI) 40.0 and over, adult: Secondary | ICD-10-CM

## 2023-04-26 DIAGNOSIS — M7989 Other specified soft tissue disorders: Secondary | ICD-10-CM | POA: Diagnosis not present

## 2023-04-26 DIAGNOSIS — R609 Edema, unspecified: Secondary | ICD-10-CM | POA: Diagnosis not present

## 2023-04-26 DIAGNOSIS — M25431 Effusion, right wrist: Secondary | ICD-10-CM | POA: Diagnosis not present

## 2023-04-26 DIAGNOSIS — M25421 Effusion, right elbow: Secondary | ICD-10-CM | POA: Diagnosis not present

## 2023-04-26 DIAGNOSIS — M25521 Pain in right elbow: Secondary | ICD-10-CM | POA: Diagnosis not present

## 2023-04-26 DIAGNOSIS — G5601 Carpal tunnel syndrome, right upper limb: Secondary | ICD-10-CM | POA: Diagnosis not present

## 2023-04-26 DIAGNOSIS — Z8739 Personal history of other diseases of the musculoskeletal system and connective tissue: Secondary | ICD-10-CM

## 2023-04-26 DIAGNOSIS — M65941 Unspecified synovitis and tenosynovitis, right hand: Secondary | ICD-10-CM | POA: Diagnosis not present

## 2023-04-26 LAB — RENAL FUNCTION PANEL
Albumin: 3.2 g/dL — ABNORMAL LOW (ref 3.5–5.0)
Anion gap: 10 (ref 5–15)
BUN: 56 mg/dL — ABNORMAL HIGH (ref 6–20)
CO2: 20 mmol/L — ABNORMAL LOW (ref 22–32)
Calcium: 9.3 mg/dL (ref 8.9–10.3)
Chloride: 106 mmol/L (ref 98–111)
Creatinine, Ser: 2.96 mg/dL — ABNORMAL HIGH (ref 0.61–1.24)
GFR, Estimated: 26 mL/min — ABNORMAL LOW (ref >60)
Glucose, Bld: 48 mg/dL — ABNORMAL LOW (ref 70–99)
Phosphorus: 3.6 mg/dL (ref 2.5–4.6)
Potassium: 4.4 mmol/L (ref 3.5–5.1)
Sodium: 136 mmol/L (ref 135–145)

## 2023-04-26 LAB — CBC WITH DIFFERENTIAL/PLATELET
Abs Immature Granulocytes: 0.05 10*3/uL (ref 0.00–0.07)
Basophils Absolute: 0 10*3/uL (ref 0.0–0.1)
Basophils Relative: 0 %
Eosinophils Absolute: 0.1 10*3/uL (ref 0.0–0.5)
Eosinophils Relative: 1 %
HCT: 33 % — ABNORMAL LOW (ref 39.0–52.0)
Hemoglobin: 9.9 g/dL — ABNORMAL LOW (ref 13.0–17.0)
Immature Granulocytes: 0 %
Lymphocytes Relative: 9 %
Lymphs Abs: 1.1 10*3/uL (ref 0.7–4.0)
MCH: 28.2 pg (ref 26.0–34.0)
MCHC: 30 g/dL (ref 30.0–36.0)
MCV: 94 fL (ref 80.0–100.0)
Monocytes Absolute: 1.5 10*3/uL — ABNORMAL HIGH (ref 0.1–1.0)
Monocytes Relative: 13 %
Neutro Abs: 8.5 10*3/uL — ABNORMAL HIGH (ref 1.7–7.7)
Neutrophils Relative %: 77 %
Platelets: 204 10*3/uL (ref 150–400)
RBC: 3.51 MIL/uL — ABNORMAL LOW (ref 4.22–5.81)
RDW: 16.6 % — ABNORMAL HIGH (ref 11.5–15.5)
WBC: 11.2 10*3/uL — ABNORMAL HIGH (ref 4.0–10.5)
nRBC: 0 % (ref 0.0–0.2)

## 2023-04-26 LAB — COMPREHENSIVE METABOLIC PANEL WITH GFR
ALT: 9 U/L (ref 0–44)
AST: 11 U/L — ABNORMAL LOW (ref 15–41)
Albumin: 3.2 g/dL — ABNORMAL LOW (ref 3.5–5.0)
Alkaline Phosphatase: 49 U/L (ref 38–126)
Anion gap: 11 (ref 5–15)
BUN: 57 mg/dL — ABNORMAL HIGH (ref 6–20)
CO2: 20 mmol/L — ABNORMAL LOW (ref 22–32)
Calcium: 9.1 mg/dL (ref 8.9–10.3)
Chloride: 107 mmol/L (ref 98–111)
Creatinine, Ser: 3.07 mg/dL — ABNORMAL HIGH (ref 0.61–1.24)
GFR, Estimated: 24 mL/min — ABNORMAL LOW (ref >60)
Glucose, Bld: 49 mg/dL — ABNORMAL LOW (ref 70–99)
Potassium: 4.7 mmol/L (ref 3.5–5.1)
Sodium: 138 mmol/L (ref 135–145)
Total Bilirubin: 0.7 mg/dL (ref 0.0–1.2)
Total Protein: 7 g/dL (ref 6.5–8.1)

## 2023-04-26 LAB — IRON AND TIBC
Iron: 18 ug/dL — ABNORMAL LOW (ref 45–182)
Saturation Ratios: 7 % — ABNORMAL LOW (ref 17.9–39.5)
TIBC: 256 ug/dL (ref 250–450)
UIBC: 238 ug/dL

## 2023-04-26 LAB — C-REACTIVE PROTEIN: CRP: 16 mg/dL — ABNORMAL HIGH (ref <1.0)

## 2023-04-26 LAB — SEDIMENTATION RATE: Sed Rate: 91 mm/h — ABNORMAL HIGH (ref 0–16)

## 2023-04-26 LAB — URIC ACID: Uric Acid, Serum: 10.3 mg/dL — ABNORMAL HIGH (ref 3.7–8.6)

## 2023-04-26 LAB — CBG MONITORING, ED
Glucose-Capillary: 169 mg/dL — ABNORMAL HIGH (ref 70–99)
Glucose-Capillary: 123 mg/dL — ABNORMAL HIGH (ref 70–99)

## 2023-04-26 LAB — FERRITIN: Ferritin: 568 ng/mL — ABNORMAL HIGH (ref 24–336)

## 2023-04-26 LAB — I-STAT CG4 LACTIC ACID, ED: Lactic Acid, Venous: <0.3 mmol/L — ABNORMAL LOW (ref 0.5–1.9)

## 2023-04-26 LAB — POCT HEMOGLOBIN-HEMACUE: Hemoglobin: 10 g/dL — ABNORMAL LOW (ref 13.0–17.0)

## 2023-04-26 MED ORDER — VANCOMYCIN HCL IN DEXTROSE 1-5 GM/200ML-% IV SOLN
1000.0000 mg | Freq: Once | INTRAVENOUS | Status: AC
  Administered 2023-04-26: 1000 mg via INTRAVENOUS
  Filled 2023-04-26: qty 200

## 2023-04-26 MED ORDER — DARBEPOETIN ALFA 200 MCG/0.4ML IJ SOSY
200.0000 ug | PREFILLED_SYRINGE | INTRAMUSCULAR | Status: DC
Start: 2023-04-26 — End: 2023-04-27

## 2023-04-26 MED ORDER — LIDOCAINE HCL (PF) 1 % IJ SOLN
5.0000 mL | Freq: Once | INTRAMUSCULAR | Status: AC
  Administered 2023-04-26: 5 mL
  Filled 2023-04-26: qty 5

## 2023-04-26 MED ORDER — SODIUM CHLORIDE 0.9 % IV SOLN
2.0000 g | Freq: Once | INTRAVENOUS | Status: AC
  Administered 2023-04-26: 2 g via INTRAVENOUS
  Filled 2023-04-26: qty 20

## 2023-04-26 MED ORDER — DARBEPOETIN ALFA 200 MCG/0.4ML IJ SOSY
PREFILLED_SYRINGE | INTRAMUSCULAR | Status: AC
  Administered 2023-04-26: 200 ug via SUBCUTANEOUS
  Filled 2023-04-26: qty 0.4

## 2023-04-26 MED ORDER — MORPHINE SULFATE (PF) 4 MG/ML IV SOLN
4.0000 mg | Freq: Once | INTRAVENOUS | Status: AC
  Administered 2023-04-26: 4 mg via INTRAVENOUS
  Filled 2023-04-26: qty 1

## 2023-04-26 MED ORDER — HYDROCODONE-ACETAMINOPHEN 5-325 MG PO TABS
1.0000 | ORAL_TABLET | Freq: Once | ORAL | Status: AC
  Administered 2023-04-26: 1 via ORAL
  Filled 2023-04-26: qty 1

## 2023-04-26 NOTE — ED Provider Notes (Signed)
 [facility] AT [facility] Provider Note   CSN: [id number] Arrival date & time: 04/26/23  2130     History  Chief Complaint  Patient presents with   Arm Pain    [name] is a 48 y.o. male.  With a history of CKD, type 2 diabetes and gout status post right AKA who presents to the ED for right upper extremity pain.  Right upper extremity pain beginning in the elbow and radiating through l right forearm right wrist right hand 2 days ago.  Now with exquisite tenderness swelling increased warmth of right hand.  Was seen at PCP office today and directed here for further evaluation given concern for potential septic arthritis versus gout flare versus cellulitis.  Patient was recently started on allopurinol  which she has been compliant with.  He does report some shaking chills.  No fevers, nausea vomiting or other symptoms at this time.   Arm Pain       Home Medications Prior to Admission medications   Medication Sig Start Date End Date Taking? Authorizing Provider  acetaminophen  (TYLENOL ) 325 MG tablet Take 2 tablets (650 mg total) by mouth every 6 (six) hours. Patient taking differently: Take 650 mg by mouth every 6 (six) hours as needed for mild pain (pain score 1-3). 02/26/23   Aurora Lees, DO  allopurinol  (ZYLOPRIM ) 100 MG tablet Take 0.5 tablets (50 mg total) by mouth every other day. 03/27/23   Sheree Dieter, MD  amLODipine  (NORVASC ) 10 MG tablet Take 10 mg by mouth at bedtime.    [provider]  B-D UF III MINI PEN NEEDLES 31G X 5 MM MISC USE 1 NEEDLE FIVE TIMES DAILY 04/03/23   Priscella Brooms, DO  Blood Glucose Monitoring Suppl (ACCU-CHEK GUIDE ME) w/Device KIT Use to check blood sugar up to 3 times a day 05/11/22   Lajean Pike, MD  carvedilol  (COREG ) 25 MG tablet TAKE 1 TABLET BY MOUTH TWICE DAILY WITH A MEAL 12/04/22   Amoako, Prince, MD  cloNIDine  (CATAPRES  - DOSED IN MG/24 HR) 0.1 mg/24hr patch 0.1 mg once a week. 05/04/21    [provider]  diclofenac  Sodium (VOLTAREN ) 1 % GEL Apply 2 g topically 4 (four) times daily. Patient taking differently: Apply 2 g topically daily as needed (for pain). 01/31/21   Freada Jacobs, MD  ELIQUIS  5 MG TABS tablet Take 1 tablet by mouth twice daily 04/02/23   Priscella Brooms, DO  fluticasone  (FLONASE ) 50 MCG/ACT nasal spray Place 2 sprays into both nostrils daily. Patient taking differently: Place 2 sprays into both nostrils daily as needed for allergies. 03/29/20 02/23/24  Freada Jacobs, MD  furosemide  (LASIX ) 80 MG tablet Take 1 tablet (80 mg total) by mouth 2 (two) times daily. 03/16/23   Aurora Lees, DO  gabapentin  (NEURONTIN ) 100 MG capsule Take 100mg  in the morning, 100mg  in the afternoon, and 200mg  at night 03/26/23   Sheree Dieter, MD  Glucagon  (GVOKE HYPOPEN  1-PACK) 1 MG/0.2ML SOAJ Inject 1 mg into the skin as needed (low blood sugar with impaired consciousness). 09/05/22   Thapa, [place], MD  glucose blood (ACCU-CHEK GUIDE) test strip Check blood sugar up to 3 times a day 05/11/22   Lajean Pike, MD  hydrALAZINE  (APRESOLINE ) 100 MG tablet Take 100 mg by mouth 3 (three) times daily. 07/13/20   [provider]  insulin  degludec (TRESIBA  FLEXTOUCH) 200 UNIT/ML FlexTouch Pen Inject 46 Units into the skin daily. 03/26/23   Sheree Dieter, MD  Insulin  Pen Needle (B-D UF III MINI PEN NEEDLES) 31G X 5 MM MISC 1 each by Other route See admin instructions. USE 4-5 TIMES DAILY 03/08/20   Marya Smack, MD  Insulin  Pen Needle (INSUPEN PEN NEEDLES) 32G X 4 MM MISC Use as directed 02/26/23   Aurora Lees, DO  Lancet Devices [facility]) lancets Use to check blood sugars 5 times a day. Dx code:E11.9. Insulin  dependent. Patient taking differently: 1 each by Other route See admin instructions. Use to check blood sugars 5 times a day. Dx code:E11.9. Insulin  dependent. 08/14/16   Buena Carmine Ratliff, DO  lidocaine  (HM LIDOCAINE  PATCH) 4 % Place 1 patch onto the skin  daily. 12/22/21   True Fuss, MD  lovastatin  (MEVACOR ) 20 MG tablet Take 1 tablet by mouth once daily 12/04/22   Amoako, Prince, MD  NOVOLOG  FLEXPEN 100 UNIT/ML FlexPen Inject 0-10 Units into the skin 3 (three) times daily with meals. Glucose 141-180: inject 4 units, Glucose 181-220: inject 6 units, Glucose 221-260: inject 8 units, Glucose 261-300: inject 10 units, If glucose if greater than 300, please inject 12 units and call your PCP and/or endocrinologist office 03/16/23 06/14/23  Aurora Lees, DO  oxybutynin  (DITROPAN ) 5 MG tablet TAKE 1 TABLET BY MOUTH THREE TIMES DAILY 04/19/23   Priscella Brooms, DO  pantoprazole  (PROTONIX ) 20 MG tablet Take 1 tablet (20 mg total) by mouth daily. Patient not taking: Reported on 04/10/2023 02/27/23   Aurora Lees, DO  polyethylene glycol (MIRALAX  / GLYCOLAX ) 17 g packet Take 17 g by mouth 2 (two) times daily. Patient not taking: Reported on 03/27/2023 09/02/19   Sherrel Dodge D, DO  potassium chloride  SA (KLOR-CON ) 20 MEQ tablet Take 20 mEq by mouth 3 (three) times daily. 11/24/19   [provider]  senna-docusate (SENOKOT-S) 8.6-50 MG tablet Take 1 tablet by mouth daily. Patient not taking: Reported on 03/27/2023 09/26/19   Freada Jacobs, MD  tamsulosin  (FLOMAX ) 0.4 MG CAPS capsule Take 1 capsule by mouth at bedtime 12/04/22   Amoako, Prince, MD  tirzepatide  (MOUNJARO ) 15 MG/0.5ML Pen Inject 15 mg into the skin once a week. 09/05/22   Thapa, [place], MD  traMADol  (ULTRAM ) 50 MG tablet Take 1 tablet (50 mg total) by mouth every 12 (twelve) hours as needed. Patient not taking: Reported on 04/03/2023 02/21/23   Sandie Cross, PA-C      Allergies    Reglan  [metoclopramide ], Metformin  and related, Ozempic  (0.25 or 0.5 mg-dose) [semaglutide (0.25 or 0.5mg -dos)], and Thiazide-type diuretics    Review of Systems   Review of Systems  Physical Exam Updated Vital Signs BP (!) 143/72   Pulse 96   Temp 98.7 F (37.1 C) (Oral)   Resp 18   Ht 6\' 2"   (1.88 m)   Wt (!) 149.7 kg   SpO2 100%   BMI 42.37 kg/m  Physical Exam Vitals and nursing note reviewed.  HENT:     Head: Normocephalic and atraumatic.  Eyes:     Pupils: Pupils are equal, round, and reactive to light.  Cardiovascular:     Rate and Rhythm: Normal rate and regular rhythm.  Pulmonary:     Effort: Pulmonary effort is normal.     Breath sounds: Normal breath sounds.  Abdominal:     Palpations: Abdomen is soft.     Tenderness: There is no abdominal tenderness.  Musculoskeletal:     Comments: Increased erythema warmth and edema over dorsal aspect of right hand right wrist Exquisitely tender to touch Sensation  intact to light touch throughout right hand and right wrist No deformity No abrasions laceration Limited motor strength testing right hand secondary to pain 2+ radial pulse bilaterally  Skin:    General: Skin is warm and dry.  Neurological:     Mental Status: He is alert.  Psychiatric:        Mood and Affect: Mood normal.     ED Results / Procedures / Treatments   Labs (all labs ordered are listed, but only abnormal results are displayed) Labs Reviewed  COMPREHENSIVE METABOLIC PANEL WITH GFR - Abnormal; Notable for the following components:      Result Value   CO2 20 (*)    Glucose, Bld 49 (*)    BUN 57 (*)    Creatinine, Ser 3.07 (*)    Albumin 3.2 (*)    AST 11 (*)    GFR, Estimated 24 (*)    All other components within normal limits  CBC WITH DIFFERENTIAL/PLATELET - Abnormal; Notable for the following components:   WBC 11.2 (*)    RBC 3.51 (*)    Hemoglobin 9.9 (*)    HCT 33.0 (*)    RDW 16.6 (*)    Neutro Abs 8.5 (*)    Monocytes Absolute 1.5 (*)    All other components within normal limits  SEDIMENTATION RATE - Abnormal; Notable for the following components:   Sed Rate 91 (*)    All other components within normal limits  C-REACTIVE PROTEIN - Abnormal; Notable for the following components:   CRP 16.0 (*)    All other components  within normal limits  URIC ACID - Abnormal; Notable for the following components:   Uric Acid, Serum 10.3 (*)    All other components within normal limits  I-STAT CG4 LACTIC ACID, ED - Abnormal; Notable for the following components:   Lactic Acid, Venous <0.3 (*)    All other components within normal limits  CBG MONITORING, ED - Abnormal; Notable for the following components:   Glucose-Capillary 169 (*)    All other components within normal limits  CBG MONITORING, ED - Abnormal; Notable for the following components:   Glucose-Capillary 123 (*)    All other components within normal limits  BODY FLUID CULTURE W GRAM STAIN  GRAM STAIN  SYNOVIAL CELL COUNT + DIFF, W/ CRYSTALS  GLUCOSE, BODY FLUID OTHER            I-STAT CG4 LACTIC ACID, ED    EKG None  Radiology DG Hand Complete Right Result Date: 04/26/2023 CLINICAL DATA:  Right hand swelling. Pain radiating to right shoulder for 2 days. No known injury. Evaluate for osteomyelitis versus fracture of right hand. Swelling and pain. EXAM: RIGHT WRIST - COMPLETE 3+ VIEW; RIGHT HAND - COMPLETE 3+ VIEW COMPARISON:  Right wrist radiographs 02/06/2023 and 02/22/2023, MRI right wrist 02/23/2023 FINDINGS: Right wrist: There is 2 mm ulnar negative variance. Joint spaces are preserved. No acute fracture or dislocation. Moderate atherosclerotic calcifications. Right hand: Unchanged chronic widening and mild irregularity of the dorsal cortex of the proximal 50% of the proximal phalanx of the thumb, likely a remote healed fracture. Joint spaces are preserved. No acute fracture is seen. No dislocation. No cortical erosion is seen Moderate atherosclerotic calcifications. IMPRESSION: 1. No acute fracture or dislocation of the right wrist or hand. 2. Unchanged chronic widening and mild irregularity of the dorsal cortex of the proximal 50% of the proximal phalanx of the thumb, likely a remote healed fracture. 3. No definite cortical  erosion is seen to indicate  radiographic evidence of osteomyelitis. Electronically Signed   By: Bertina Broccoli M.D.   On: 04/26/2023 19:26   DG Wrist Complete Right Result Date: 04/26/2023 CLINICAL DATA:  Right hand swelling. Pain radiating to right shoulder for 2 days. No known injury. Evaluate for osteomyelitis versus fracture of right hand. Swelling and pain. EXAM: RIGHT WRIST - COMPLETE 3+ VIEW; RIGHT HAND - COMPLETE 3+ VIEW COMPARISON:  Right wrist radiographs 02/06/2023 and 02/22/2023, MRI right wrist 02/23/2023 FINDINGS: Right wrist: There is 2 mm ulnar negative variance. Joint spaces are preserved. No acute fracture or dislocation. Moderate atherosclerotic calcifications. Right hand: Unchanged chronic widening and mild irregularity of the dorsal cortex of the proximal 50% of the proximal phalanx of the thumb, likely a remote healed fracture. Joint spaces are preserved. No acute fracture is seen. No dislocation. No cortical erosion is seen Moderate atherosclerotic calcifications. IMPRESSION: 1. No acute fracture or dislocation of the right wrist or hand. 2. Unchanged chronic widening and mild irregularity of the dorsal cortex of the proximal 50% of the proximal phalanx of the thumb, likely a remote healed fracture. 3. No definite cortical erosion is seen to indicate radiographic evidence of osteomyelitis. Electronically Signed   By: Bertina Broccoli M.D.   On: 04/26/2023 19:26    Procedures .Joint Aspiration/Arthrocentesis  Date/Time: 04/26/2023 11:54 PM  Performed by: Sallyanne Creamer, DO Authorized by: Sallyanne Creamer, DO   Consent:    Consent obtained:  Verbal   Consent given by:  Patient   Risks discussed:  Bleeding, nerve damage, poor cosmetic result and incomplete drainage   Alternatives discussed:  No treatment Universal protocol:    Immediately prior to procedure, a time out was called: yes     Patient identity confirmed:  Verbally with patient Location:    Location:  Wrist   Wrist:  R radiocarpal Anesthesia:     Anesthesia method:  Local infiltration   Local anesthetic:  Lidocaine  2% w/o epi Procedure details:    Preparation: Patient was prepped and draped in usual sterile fashion     Needle gauge:  18 G   Ultrasound guidance: no     Approach: Lateral to extensor pollicis longus just distal to Lister's tubercle.   Aspirate amount:  1 cc   Aspirate characteristics:  Blood-tinged Post-procedure details:    Dressing:  Adhesive bandage   Procedure completion:  Tolerated     Medications Ordered in ED Medications  HYDROcodone -acetaminophen  (NORCO/VICODIN) 5-325 MG per tablet 1 tablet (1 tablet Oral Given 04/26/23 1119)  morphine  (PF) 4 MG/ML injection 4 mg (4 mg Intravenous Given 04/26/23 1816)  vancomycin  (VANCOCIN ) IVPB 1000 mg/200 mL premix (0 mg Intravenous Stopped 04/26/23 1940)  cefTRIAXone  (ROCEPHIN ) 2 g in sodium chloride  0.9 % 100 mL IVPB (0 g Intravenous Stopped 04/26/23 1909)  lidocaine  (PF) (XYLOCAINE ) 1 % injection 5 mL (5 mLs Infiltration Given by Other 04/26/23 2214)    ED Course/ Medical Decision Making/ A&P Clinical Course as of 04/27/23 0015  Thu Apr 26, 2023  2353 Laboratory workup notable for white count of 11.2.  Elevation in CRP esr and uric acid which have all been elevated in the past for this patient.  I was able to get a small amount of synovial fluid out of the right wrist and sent for fluid analysis.  Awaiting results to better elucidate gout versus septic arthritis [MP]  Fri Apr 27, 2023  0013 IRafael Bun DO, am transitioning care of this  patient to the oncoming provider pending synovial fluid analysis, reevaluation and disposition [MP]    Clinical Course User Index [MP] Sallyanne Creamer, DO                                 Medical Decision Making 48 year old male with history as above presenting given 3 days of atraumatic right upper extremity pain.  Pain start in the elbow now most prominent over the right hand right wrist with associated erythema and edema.   Especially tender to touch increased warmth.  No inciting injury or evidence of trauma.  Differential diagnosis would include gout flare versus septic arthritis versus osteomyelitis versus cellulitis.  Will obtain laboratory workup and obtain x-rays.  History of CKD will hold off on NSAIDs and provide opioids for pain control.  Will consider arthrocentesis after labs and x-ray  Amount and/or Complexity of Data Reviewed Labs: ordered. Radiology: ordered.  Risk Prescription drug management.           Final Clinical Impression(s) / ED Diagnoses Final diagnoses:  Right hand pain  History of gout    Rx / DC Orders ED Discharge Orders     None         Sallyanne Creamer, DO 04/27/23 0015

## 2023-04-26 NOTE — ED Provider Triage Note (Signed)
 Emergency Medicine Provider Triage Evaluation Note  [name] , a 48 y.o. male hx of gout and DVT/PE on eliquis was evaluated in triage.  Pt complains of R elbow pain. Started Monday. Says that the pain has been worsening. Tried tylenol for it but no relief. Sent in from internal medicine clinic due to concerns for gout vs septic joint.   Review of Systems  Positive: chills Negative: fever  Physical Exam  BP (!) 140/73 (BP Location: Left Arm)   Pulse 89   Temp 98.8 F (37.1 C)   Resp (!) 22   SpO2 97%  Gen:   Awake, no distress   Resp:  Normal effort  MSK:   Moves extremities without difficulty Other:  Swelling of RUE with limited ROM of elbow, wrist, and shoulder  Medical Decision Making  Medically screening exam initiated at 11:07 AM.  Appropriate orders placed.  Ventura Gins was informed that the remainder of the evaluation will be completed by another provider, this initial triage assessment does not replace that evaluation, and the importance of remaining in the ED until their evaluation is complete.   Ninetta Basket, MD 04/26/23 [phone]

## 2023-04-26 NOTE — Assessment & Plan Note (Addendum)
 Patient has history of gout of right wrist. Has follow up with orthopedics. Uric acid at 3/17 visit 11.0, patient was started on allopurinol 50 mg every other day which he has been taking consistently for weeks. Per patient he is not on colchicine.  Current episode of right arm pain began Monday 4/14. Patient works out but denies any changes to routine or injury. Did not have any medication changes in the week preceding symptoms. Pain began at right shoulder and moved down his arm to his hand over the next few days. Called [facility] and told to go to urgent care/ED for symptoms. Patient preferred coming to [facility] for appointment today. He has tried warmth/ice and tramadol for pain control with limited benefit. Is concerned as his ability to move his arm and wrist have decreased over the the week, now only able to hold his right wrist with his left hand and wiggle his fingers on the right hand. Endorses chills. Denies fevers, nausea/vomiting, or other systemic symptoms. Vitals with elevated BP, afebrile, HR WNL. Physical exam concerning for acute gout vs septic joint vs cellulitis. Right hand/wrist significantly swollen and warm to touch. Very tender on palpation. Right brachial and radial pulses present, reassuring that there is blood flow to limb. Discussed options for next steps and patient agreed to ED evaluation and possible admission. Declined pain management with PO Tylenol at this time.  Plan - Called for direct admission, no beds available  - Called ED, spoke to triage nurse about patient coming for further evaluation of right arm/wrist - Patient to follow up in clinic as needed

## 2023-04-26 NOTE — Telephone Encounter (Signed)
 Pharmacy Patient Advocate Encounter  Received notification from [employer] that Prior Authorization for FreeStyle Libre 3 Sensor has been APPROVED from 04/25/2023 to 01/09/2024   PA #/Case ID/Reference #: [id number]

## 2023-04-26 NOTE — Progress Notes (Signed)
 Acute Office Visit  Subjective:     Patient ID: [name], male    DOB: 1975/03/21, 48 y.o.   MRN: [id number]  Chief Complaint  Patient presents with   Arm Pain    Right arm pain # 10 -since Monday / medication refill    Patient is a 48 y.o. with a past medical history stated below who presents today for acute right arm pain. He was last seen at [facility] on 03/26/23 for T2DM, volume overload, gout of right wrist, and insomnia. Changes at that visit include decreasing Tresiba to 45 units daily, alternating Lasix therapy to 80 mg twice daily one day and three times daily the next day, and started on allopurinol 50 mg every other day.   Please see Assessment & Plan section below for details.      Review of Systems  Constitutional:  Positive for chills and malaise/fatigue. Negative for fever.  Musculoskeletal:        Right shoulder, elbow, and wrist pain associated with swelling.       Objective:    BP 130/70 (BP Location: Left Arm, Patient Position: Sitting, Cuff Size: Large)   Pulse 92   Temp 98.5 F (36.9 C) (Oral)   Ht 6\' 2"  (1.88 m)   Wt (!) 316 lb (143.3 kg) Comment: patient states last weight  SpO2 99%   BMI 40.57 kg/m  BP Readings from Last 3 Encounters:  04/26/23 130/70  04/26/23 (!) 142/69  04/12/23 135/69   Wt Readings from Last 3 Encounters:  04/26/23 (!) 316 lb (143.3 kg)  03/16/23 (!) 342 lb 9.5 oz (155.4 kg)  02/22/23 (!) 309 lb (140.2 kg)      Physical Exam Constitutional:      Comments: Appears to be in acute pain.  HENT:     Head: Normocephalic.  Musculoskeletal:     Comments: Patient with right shoulder, elbow, and wrist tenderness with light palpation. Right hand and wrist appear very swollen, some swelling also observed on the right elbow. ROM in right upper extremity limited by pain as patient was unable to shrug right shoulder against resistance, had limited flexion/extension of arm at the elbow, could barely lift up arm against gravity, and  could not extend/flex wrist. He was able to wiggle his fingers. Skin warm to touch, sensation intact. Right radial and brachial pulses palpable.      Results for orders placed or performed during the hospital encounter of 04/26/23  Renal function panel  Result Value Ref Range   Sodium 136 135 - 145 mmol/L   Potassium 4.4 3.5 - 5.1 mmol/L   Chloride 106 98 - 111 mmol/L   CO2 20 (L) 22 - 32 mmol/L   Glucose, Bld 48 (L) 70 - 99 mg/dL   BUN 56 (H) 6 - 20 mg/dL   Creatinine, Ser 2.13 (H) 0.61 - 1.24 mg/dL   Calcium 9.3 8.9 - 08.6 mg/dL   Phosphorus 3.6 2.5 - 4.6 mg/dL   Albumin 3.2 (L) 3.5 - 5.0 g/dL   GFR, Estimated 26 (L) >60 mL/min   Anion gap 10 5 - 15  Hemoglobin-hemacue, POC  Result Value Ref Range   Hemoglobin 10.0 (L) 13.0 - 17.0 g/dL       Assessment & Plan:   Problem List Items Addressed This Visit     Right arm pain - Primary   Patient has history of gout of right wrist. Has follow up with orthopedics. Uric acid at 3/17 visit 11.0, patient was  started on allopurinol 50 mg every other day which he has been taking consistently for weeks. Per patient he is not on colchicine.  Current episode of right arm pain began Monday 4/14. Patient works out but denies any changes to routine or injury. Did not have any medication changes in the week preceding symptoms. Pain began at right shoulder and moved down his arm to his hand over the next few days. Called [facility] and told to go to urgent care/ED for symptoms. Patient preferred coming to [facility] for appointment today. He has tried warmth/ice and tramadol for pain control with limited benefit. Is concerned as his ability to move his arm and wrist have decreased over the the week, now only able to hold his right wrist with his left hand and wiggle his fingers on the right hand. Endorses chills. Denies fevers, nausea/vomiting, or other systemic symptoms. Vitals with elevated BP, afebrile, HR WNL. Physical exam concerning for acute gout vs septic  joint vs cellulitis. Right hand/wrist significantly swollen and warm to touch. Very tender on palpation. Right brachial and radial pulses present, reassuring that there is blood flow to limb. Discussed options for next steps and patient agreed to ED evaluation and possible admission. Declined pain management with PO Tylenol at this time.  Plan - Called for direct admission, no beds available  - Called ED, spoke to triage nurse about patient coming for further evaluation of right arm/wrist - Patient to follow up in clinic as needed        No orders of the defined types were placed in this encounter.  Patient seen with Dr. Jannice Mends and Dr. Jarvis Mesa.  No follow-ups on file.  Janera Peugh Arellano Zameza, MD PGY-1 [facility] I[facility]

## 2023-04-26 NOTE — ED Triage Notes (Signed)
 Pt is here for evaluation of right sided arm pain and swelling since Monday.  No trauma with this but pt states he was seen at the internal medicine clinic and they sent him to ED for evaluation due to concern for inflamation/infection in arm.  Pt has had chills.  Pain from shoulder to hand, visibly swollen

## 2023-04-26 NOTE — Addendum Note (Signed)
 Addended by: Kittie Krizan L on: 04/26/2023 09:58 AM   Modules accepted: Level of Service

## 2023-04-26 NOTE — Progress Notes (Signed)
 Pt to the ER via his own w/c; accompanied by RN  for right wrist pain. Belongings taken.

## 2023-04-26 NOTE — Progress Notes (Signed)
 Internal Medicine Clinic Attending  I was physically present during the key portions of the resident provided service and participated in the medical decision making of patient's management care. I reviewed pertinent patient test results.  The assessment, diagnosis, and plan were formulated together and I agree with the documentation in the resident's note.  Victor George, MD    [phone] man with history of gout who was recently started on allopurinol prophylaxis presents with acute severe pain in his right shoulder, right elbow, and right wrist. On exam, he has limited range of motion of right elbow and wrist. Right wrist is swollen, erythematous, and very tender to palpation. He has associated chills and fatigue, no known fevers.  Ddx includes gout flare vs septic arthritis.   I agree that he needs urgent arthrocentesis & imaging. Unfortunately, there are no beds available for direct admission, so we have sent [name] to the ER. If he needs admission, please admit to IMTS.

## 2023-04-27 ENCOUNTER — Observation Stay (HOSPITAL_COMMUNITY)

## 2023-04-27 DIAGNOSIS — G5601 Carpal tunnel syndrome, right upper limb: Secondary | ICD-10-CM | POA: Diagnosis not present

## 2023-04-27 DIAGNOSIS — M7989 Other specified soft tissue disorders: Secondary | ICD-10-CM | POA: Diagnosis not present

## 2023-04-27 DIAGNOSIS — R609 Edema, unspecified: Secondary | ICD-10-CM | POA: Diagnosis not present

## 2023-04-27 DIAGNOSIS — M25521 Pain in right elbow: Secondary | ICD-10-CM | POA: Diagnosis not present

## 2023-04-27 DIAGNOSIS — M25421 Effusion, right elbow: Secondary | ICD-10-CM | POA: Diagnosis not present

## 2023-04-27 DIAGNOSIS — M65941 Unspecified synovitis and tenosynovitis, right hand: Secondary | ICD-10-CM | POA: Diagnosis not present

## 2023-04-27 DIAGNOSIS — M25431 Effusion, right wrist: Secondary | ICD-10-CM | POA: Diagnosis not present

## 2023-04-27 DIAGNOSIS — M1990 Unspecified osteoarthritis, unspecified site: Secondary | ICD-10-CM | POA: Diagnosis not present

## 2023-04-27 DIAGNOSIS — R600 Localized edema: Secondary | ICD-10-CM | POA: Diagnosis not present

## 2023-04-27 DIAGNOSIS — M19011 Primary osteoarthritis, right shoulder: Secondary | ICD-10-CM | POA: Diagnosis not present

## 2023-04-27 DIAGNOSIS — M79601 Pain in right arm: Secondary | ICD-10-CM | POA: Diagnosis not present

## 2023-04-27 LAB — CBC
HCT: 30.6 % — ABNORMAL LOW (ref 39.0–52.0)
Hemoglobin: 9.8 g/dL — ABNORMAL LOW (ref 13.0–17.0)
MCH: 29.2 pg (ref 26.0–34.0)
MCHC: 32 g/dL (ref 30.0–36.0)
MCV: 91.1 fL (ref 80.0–100.0)
Platelets: 222 10*3/uL (ref 150–400)
RBC: 3.36 MIL/uL — ABNORMAL LOW (ref 4.22–5.81)
RDW: 16.3 % — ABNORMAL HIGH (ref 11.5–15.5)
WBC: 9.4 10*3/uL (ref 4.0–10.5)
nRBC: 0 % (ref 0.0–0.2)

## 2023-04-27 LAB — BASIC METABOLIC PANEL WITH GFR
Anion gap: 9 (ref 5–15)
BUN: 61 mg/dL — ABNORMAL HIGH (ref 6–20)
CO2: 20 mmol/L — ABNORMAL LOW (ref 22–32)
Calcium: 9.4 mg/dL (ref 8.9–10.3)
Chloride: 108 mmol/L (ref 98–111)
Creatinine, Ser: 3.01 mg/dL — ABNORMAL HIGH (ref 0.61–1.24)
GFR, Estimated: 25 mL/min — ABNORMAL LOW (ref >60)
Glucose, Bld: 77 mg/dL (ref 70–99)
Potassium: 4.3 mmol/L (ref 3.5–5.1)
Sodium: 137 mmol/L (ref 135–145)

## 2023-04-27 LAB — GLUCOSE, CAPILLARY
Glucose-Capillary: 104 mg/dL — ABNORMAL HIGH (ref 70–99)
Glucose-Capillary: 91 mg/dL (ref 70–99)
Glucose-Capillary: 108 mg/dL — ABNORMAL HIGH (ref 70–99)
Glucose-Capillary: 98 mg/dL (ref 70–99)
Glucose-Capillary: 137 mg/dL — ABNORMAL HIGH (ref 70–99)

## 2023-04-27 LAB — SYNOVIAL CELL COUNT + DIFF, W/ CRYSTALS
Crystals, Fluid: NONE SEEN
Eosinophils-Synovial: 0 % (ref 0–1)
Lymphocytes-Synovial Fld: 1 % (ref 0–20)
Monocyte-Macrophage-Synovial Fluid: 3 % — ABNORMAL LOW (ref 50–90)
Neutrophil, Synovial: 96 % — ABNORMAL HIGH (ref 0–25)
WBC, Synovial: 3425 /mm3 — ABNORMAL HIGH (ref 0–200)

## 2023-04-27 MED ORDER — HYDROMORPHONE HCL 2 MG PO TABS: 2.0000 mg | ORAL_TABLET | Freq: Four times a day (QID) | ORAL | Status: DC | PRN

## 2023-04-27 MED ORDER — FUROSEMIDE 40 MG PO TABS
80.0000 mg | ORAL_TABLET | Freq: Two times a day (BID) | ORAL | Status: DC
  Administered 2023-04-27 – 2023-05-01 (×9): 80 mg via ORAL
  Filled 2023-04-27 (×9): qty 2

## 2023-04-27 MED ORDER — INSULIN GLARGINE-YFGN 100 UNIT/ML ~~LOC~~ SOLN
35.0000 [IU] | SUBCUTANEOUS | Status: DC
  Filled 2023-04-27: qty 0.35

## 2023-04-27 MED ORDER — ACETAMINOPHEN 500 MG PO TABS
1000.0000 mg | ORAL_TABLET | Freq: Three times a day (TID) | ORAL | Status: DC
  Administered 2023-04-27 – 2023-05-01 (×13): 1000 mg via ORAL
  Filled 2023-04-27 (×13): qty 2

## 2023-04-27 MED ORDER — INSULIN DEGLUDEC 200 UNIT/ML ~~LOC~~ SOPN: 45.0000 [IU] | PEN_INJECTOR | SUBCUTANEOUS | Status: DC

## 2023-04-27 MED ORDER — TAMSULOSIN HCL 0.4 MG PO CAPS
0.4000 mg | ORAL_CAPSULE | Freq: Every day | ORAL | Status: DC
  Administered 2023-04-27 – 2023-04-30 (×4): 0.4 mg via ORAL
  Filled 2023-04-27 (×4): qty 1

## 2023-04-27 MED ORDER — GABAPENTIN 100 MG PO CAPS
200.0000 mg | ORAL_CAPSULE | Freq: Every day | ORAL | Status: DC
  Administered 2023-04-27 – 2023-04-30 (×4): 200 mg via ORAL
  Filled 2023-04-27 (×4): qty 2

## 2023-04-27 MED ORDER — POLYETHYLENE GLYCOL 3350 17 G PO PACK: 17.0000 g | PACK | Freq: Every day | ORAL | Status: DC | PRN

## 2023-04-27 MED ORDER — DICLOFENAC SODIUM 1 % EX GEL
2.0000 g | Freq: Every day | CUTANEOUS | Status: DC | PRN
  Administered 2023-04-28: 2 g via TOPICAL
  Filled 2023-04-27: qty 100

## 2023-04-27 MED ORDER — APIXABAN 5 MG PO TABS
5.0000 mg | ORAL_TABLET | Freq: Two times a day (BID) | ORAL | Status: DC
  Administered 2023-04-27 – 2023-05-01 (×9): 5 mg via ORAL
  Filled 2023-04-27 (×6): qty 1
  Filled 2023-04-27: qty 2
  Filled 2023-04-27 (×2): qty 1

## 2023-04-27 MED ORDER — GADOBUTROL 1 MMOL/ML IV SOLN
10.0000 mL | Freq: Once | INTRAVENOUS | Status: AC | PRN
  Administered 2023-04-27: 10 mL via INTRAVENOUS

## 2023-04-27 MED ORDER — CEFTRIAXONE SODIUM 2 G IJ SOLR
2.0000 g | INTRAMUSCULAR | Status: DC
  Administered 2023-04-27 – 2023-04-28 (×2): 2 g via INTRAVENOUS
  Filled 2023-04-27 (×3): qty 20

## 2023-04-27 MED ORDER — GABAPENTIN 100 MG PO CAPS: 100.0000 mg | ORAL_CAPSULE | ORAL | Status: DC

## 2023-04-27 MED ORDER — HYDROCODONE-ACETAMINOPHEN 5-325 MG PO TABS
1.0000 | ORAL_TABLET | Freq: Once | ORAL | Status: AC
  Administered 2023-04-27: 1 via ORAL
  Filled 2023-04-27: qty 1

## 2023-04-27 MED ORDER — LINEZOLID 600 MG PO TABS
600.0000 mg | ORAL_TABLET | Freq: Two times a day (BID) | ORAL | Status: DC
  Administered 2023-04-27 – 2023-04-29 (×5): 600 mg via ORAL
  Filled 2023-04-27 (×5): qty 1

## 2023-04-27 MED ORDER — INSULIN ASPART 100 UNIT/ML IJ SOLN: 0.0000 [IU] | Freq: Every day | INTRAMUSCULAR | Status: DC

## 2023-04-27 MED ORDER — OXYBUTYNIN CHLORIDE 5 MG PO TABS
5.0000 mg | ORAL_TABLET | Freq: Three times a day (TID) | ORAL | Status: DC
  Administered 2023-04-27 – 2023-04-30 (×12): 5 mg via ORAL
  Filled 2023-04-27 (×15): qty 1

## 2023-04-27 MED ORDER — INSULIN ASPART 100 UNIT/ML IJ SOLN
0.0000 [IU] | Freq: Three times a day (TID) | INTRAMUSCULAR | Status: DC
  Administered 2023-04-28 – 2023-04-29 (×3): 2 [IU] via SUBCUTANEOUS
  Administered 2023-04-30: 3 [IU] via SUBCUTANEOUS

## 2023-04-27 MED ORDER — OXYCODONE HCL 5 MG PO TABS
5.0000 mg | ORAL_TABLET | Freq: Four times a day (QID) | ORAL | Status: DC | PRN
  Administered 2023-04-27: 5 mg via ORAL
  Filled 2023-04-27: qty 1

## 2023-04-27 MED ORDER — MORPHINE SULFATE (PF) 4 MG/ML IV SOLN
4.0000 mg | Freq: Once | INTRAVENOUS | Status: AC
  Administered 2023-04-27: 4 mg via INTRAVENOUS
  Filled 2023-04-27: qty 1

## 2023-04-27 MED ORDER — PRAVASTATIN SODIUM 10 MG PO TABS
20.0000 mg | ORAL_TABLET | Freq: Every day | ORAL | Status: DC
  Administered 2023-04-27 – 2023-04-30 (×4): 20 mg via ORAL
  Filled 2023-04-27 (×5): qty 2

## 2023-04-27 MED ORDER — FLUTICASONE PROPIONATE 50 MCG/ACT NA SUSP
2.0000 | Freq: Every day | NASAL | Status: DC
Start: 2023-04-27 — End: 2023-05-01
  Administered 2023-04-27 – 2023-05-01 (×5): 2 via NASAL
  Filled 2023-04-27: qty 16

## 2023-04-27 MED ORDER — FUROSEMIDE 40 MG PO TABS: 80.0000 mg | ORAL_TABLET | ORAL | Status: DC

## 2023-04-27 MED ORDER — GABAPENTIN 100 MG PO CAPS
100.0000 mg | ORAL_CAPSULE | Freq: Two times a day (BID) | ORAL | Status: DC
  Administered 2023-04-27 – 2023-05-01 (×9): 100 mg via ORAL
  Filled 2023-04-27 (×9): qty 1

## 2023-04-27 MED ORDER — ACETAMINOPHEN 325 MG PO TABS: 650.0000 mg | ORAL_TABLET | Freq: Four times a day (QID) | ORAL | Status: DC | PRN

## 2023-04-27 MED ORDER — HYDRALAZINE HCL 50 MG PO TABS
100.0000 mg | ORAL_TABLET | Freq: Three times a day (TID) | ORAL | Status: DC
  Administered 2023-04-27 – 2023-05-01 (×13): 100 mg via ORAL
  Filled 2023-04-27 (×13): qty 2

## 2023-04-27 MED ORDER — HYDROMORPHONE HCL 2 MG PO TABS
2.0000 mg | ORAL_TABLET | Freq: Four times a day (QID) | ORAL | Status: DC | PRN
  Administered 2023-04-28 – 2023-05-01 (×6): 2 mg via ORAL
  Filled 2023-04-27 (×6): qty 1

## 2023-04-27 MED ORDER — CARVEDILOL 25 MG PO TABS
25.0000 mg | ORAL_TABLET | Freq: Two times a day (BID) | ORAL | Status: DC
  Administered 2023-04-27 – 2023-05-01 (×9): 25 mg via ORAL
  Filled 2023-04-27 (×9): qty 1

## 2023-04-27 MED ORDER — SENNA 8.6 MG PO TABS: 1.0000 | ORAL_TABLET | Freq: Every day | ORAL | Status: DC | PRN

## 2023-04-27 MED ORDER — AMLODIPINE BESYLATE 10 MG PO TABS
10.0000 mg | ORAL_TABLET | Freq: Every day | ORAL | Status: DC
  Administered 2023-04-27 – 2023-04-30 (×4): 10 mg via ORAL
  Filled 2023-04-27 (×4): qty 1

## 2023-04-27 NOTE — H&P (Addendum)
 Date: 04/27/2023               Patient Name:  [name] MRN: [id number]  DOB: 1975/11/01 Age / Sex: 48 y.o., male   PCP: Rosan Dayton BROCKS, DO         Medical Service: Internal Medicine Teaching Service         Attending Physician: Dr. Jerrell, Cleatus Ned, *      First Contact: Dr. Cheron Mallard, MD Pager [phone]    Second Contact: Dr. Fairy Pool, DO          After Hours (After 5p/  First Contact Pager: [phone]  weekends / holidays): Second Contact Pager: [phone]   SUBJECTIVE   Chief Complaint: Right arm pain / swelling  History of Present Illness: This is a 48 year old with pertinent past medical history of type 2 diabetes (last A1C 4.9), HFpEF, CKD 4, BMI 42, PE (on Eliquis ), gout, and previous right sided above-the-knee amputation 2-2 osteomyelitis presenting to the ED with 5 day history of right arm pain.    He was seen in clinic on 02/22/2023, at which time he was found to have a warm and swollen right upper extremity.  He was admitted for further medical workup due to concerns of septic joint, and was diagnosed with gout.  He was discharged with a prednisone  taper.  Following this admission he presented with volume overload and was hospitalized on 03/13/2023 due to acute hypoxic respiratory failure.  He was seen by myself in clinic on 03/21/2023 with residual volume overload, he was diuresed outpatient.  He was again seen in clinic on 03/26/2023, and was started on allopurinol  50 mg every other day.  He has contraindications to NSAIDs due to his kidney function, colchicine  due drug-drug interactions with carvedilol , and preferred not to take prednisone  again due to bad experiences with this medication.  For these reasons, no other gout prophylaxis was initiated alongside allopurinol .  In speaking to the patient today, he has been taking 100 mg every other day rather than the prescribed 50 mg every other day.  He was seen on 04/26/2023 in the internal medicine  clinic, at which time he had findings concerning for gout flare versus septic joint versus cellulitis, and was sent to the ED for further evaluation.  The patient states he first noticed symptoms at the beginning of this week, the pain started in his elbows and then spread to his wrist and shoulder.  He endorses some chills over the last week, but otherwise denies fevers or night sweats.  He has restricted range of motion in the right hand, elbow, and shoulder as a result of his pain.  Denies weakness, numbness, but does endorse occasional tingling.  He states that his range of motion in his hand has perhaps slightly increased over the last couple days, but otherwise this has been constant.  He has been compliant with all his medications, and does not note any other recent changes or inciting events.  He states been difficult for him to transfer from his wheelchair as a result of his pain.  Medications Allopurinol  100 mg every other day (prescribed 50 mg every other day) Amlodipine  10 mg daily Clonidine  patch 0.1 mg weekly  Coreg  25 mg twice daily Hydralazine  100 mg every 8 hours Lasix  80 mg twice daily Eliquis  5 mg twice daily Tresiba  40 units daily NovoLog  sliding scale Gabapentin  100, 100, 200 mg morning afternoon and evening. Oxybutynin  5 mg 3 times daily Tamsulosin  0.4 mg daily Pantoprazole   20 mg daily Pravastatin  20 mg daily  ED Course: Vitals: Hypertensive, afebrile, otherwise within normal limits. Lab work: Ferritin increased to 568, iron low at 18, saturation low at 7.  Renal function panel significant for bicarb of 20, glucose low at 48, BUN 56, creatinine 2.96.  Kidney function is at baseline.  ESR and CRP elevated.  Uric acid persistently elevated at 10.3.  CBC with leukocytosis, white blood cell count 11.2, stable anemia with hemoglobin 9.9.  Lactic acid within normal limits.  The right wrist was aspirated and demonstrated hazy synovial fluid, white blood cell count 3400 cells per  cubic millimeter.  Neutrophil predominance (96%).  Further studies pending. Imaging: X-rays of the hand and wrist demonstrating no acute changes or definite radiographic evidence of osteomyelitis.  MRI of the hand and wrist pending. In the ED patient was given started on vancomycin  and ceftriaxone .  He was given 4 mg morphine  IV x 2, 1 tablet Norco 5.  Meds:  No outpatient medications have been marked as taking for the 04/26/23 encounter [facility] Encounter).    Past Medical History  Past Surgical History:  Procedure Laterality Date   AMPUTATION Right 12/22/2013   Procedure: AMPUTATION BELOW KNEE;  Surgeon: Norleen Armor, MD;  Location: [facility] OR;  Service: Orthopedics;  Laterality: Right;   AMPUTATION Right 02/19/2014   Procedure: RIGHT ABOVE KNEE AMPUTATION ;  Surgeon: Norleen Armor, MD;  Location: [facility] OR;  Service: Orthopedics;  Laterality: Right;   blood clot removal  2016   CARDIAC CATHETERIZATION  02/18/2013   normal coronaries   ESOPHAGOGASTRODUODENOSCOPY N/A 11/02/2016   Procedure: ESOPHAGOGASTRODUODENOSCOPY (EGD);  Surgeon: Avram Lupita BRAVO, MD;  Location: [facility] ENDOSCOPY;  Service: Endoscopy;  Laterality: N/A;   I & D EXTREMITY Right 12/14/2013   Procedure: IRRIGATION AND DEBRIDEMENT RIGHT FOOT;  Surgeon: Elspeth JONELLE Her, MD;  Location: [facility] OR;  Service: Orthopedics;  Laterality: Right;   INCISION AND DRAINAGE ABSCESS  2007; 2015   back   INCISION AND DRAINAGE OF WOUND Right 12/22/2013   Procedure: I&D RIGHT BUTTOCK;  Surgeon: Norleen Armor, MD;  Location: [facility] OR;  Service: Orthopedics;  Laterality: Right;   LEFT HEART CATHETERIZATION WITH CORONARY ANGIOGRAM N/A 02/18/2013   Procedure: LEFT HEART CATHETERIZATION WITH CORONARY ANGIOGRAM;  Surgeon: Peter M Jordan, MD;  Location: [facility] CATH LAB;  Service: Cardiovascular;  Laterality: N/A;    Social:  Lives With: Alone 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  Has had recent issues with visual right lower extremity recently seen by Dr. Harden with tentative plans for revision surgery.  Ambulates with wheelchair, unable to use prosthesis. PCP: Camellia Eastern DO Substances: None  Allergies: Allergies as of 04/26/2023 - Review Complete 04/26/2023  Allergen Reaction Noted   Reglan  [metoclopramide ] Other (See Comments) 11/01/2016   Metformin  and related Diarrhea 08/19/2019   Ozempic  (0.25 or 0.5 mg-dose) [semaglutide (0.25 or 0.5mg -dos)] Nausea And Vomiting 08/19/2019   Thiazide-type diuretics  02/24/2023    Review of Systems: Please see HPI and A&P for pertinent positives and negatives.   OBJECTIVE:   Physical Exam: Blood pressure (!) 143/72, pulse 96, temperature 98.7 F (37.1 C), temperature source Oral, resp. rate 18, height 6' 2 (1.88 m), weight (!) 149.7 kg, SpO2 100%.  Constitutional: well-appearing mildly uncomfortable otherwise in no acute distress HENT: Cardiovascular: regular rate and rhythm, no m/r/g Pulmonary/Chest: normal work of breathing on room air,  lungs clear to auscultation bilaterally Abdominal: soft, non-tender, non-distended Extremities: 1+ edema to midshin of the left lower extremity.  Mild edema appreciated in the residual right lower extremity MSK: Diffuse edema of the right upper extremity from upper arm into the hands.  Diffuse tenderness to palpation. Right upper extremity sensation intact.  2+ radial pulses bilaterally.  Pain with active and passive range of motion of the elbow and shoulder.  3 out of 5 strength, secondary to pain.  No rashes or erythema appreciated.   Labs: CBC    Component Value Date/Time   WBC 11.2 (H) 04/26/2023 1039   RBC 3.51 (L) 04/26/2023 1039   HGB 9.9 (L) 04/26/2023 1039   HGB 9.2 (L) 04/24/2018 1040   HCT 33.0 (L) 04/26/2023 1039   HCT 28.4 (L) 04/24/2018 1040   PLT 204 04/26/2023 1039   PLT 359 04/24/2018 1040    MCV 94.0 04/26/2023 1039   MCV 85 04/24/2018 1040   MCH 28.2 04/26/2023 1039   MCHC 30.0 04/26/2023 1039   RDW 16.6 (H) 04/26/2023 1039   RDW 14.1 04/24/2018 1040   LYMPHSABS 1.1 04/26/2023 1039   LYMPHSABS 1.7 04/24/2018 1040   MONOABS 1.5 (H) 04/26/2023 1039   EOSABS 0.1 04/26/2023 1039   EOSABS 0.3 04/24/2018 1040   BASOSABS 0.0 04/26/2023 1039   BASOSABS 0.0 04/24/2018 1040     CMP     Component Value Date/Time   NA 138 04/26/2023 1039   NA 137 06/03/2019 0948   K 4.7 04/26/2023 1039   CL 107 04/26/2023 1039   CO2 20 (L) 04/26/2023 1039   GLUCOSE 49 (L) 04/26/2023 1039   BUN 57 (H) 04/26/2023 1039   BUN 63 (H) 06/03/2019 0948   CREATININE 3.07 (H) 04/26/2023 1039   CREATININE 1.78 (H) 12/09/2013 1559   CALCIUM  9.1 04/26/2023 1039   CALCIUM  9.2 11/03/2022 0916   PROT 7.0 04/26/2023 1039   ALBUMIN 3.2 (L) 04/26/2023 1039   AST 11 (L) 04/26/2023 1039   ALT 9 04/26/2023 1039   ALKPHOS 49 04/26/2023 1039   BILITOT 0.7 04/26/2023 1039   GFRNONAA 24 (L) 04/26/2023 1039   GFRNONAA 48 (L) 12/09/2013 1559   GFRAA 16 (L) 10/06/2019 1337   GFRAA 55 (L) 12/09/2013 1559   EKG: personally reviewed my interpretation is normal sinus rhythm. Prior EKG similar  ASSESSMENT & PLAN:   Assessment & Plan by Problem: Principal Problem:   Right arm pain   Silvia Bamber is a 48 y.o. person living This is a 48 year old with pertinent past medical history of type 2 diabetes (last A1C 4.9), HFpEF, CKD 4, BMI 42, PE (on Eliquis ), gout, and previous right sided above-the-knee amputation 2-2 osteomyelitis presenting to the ED with 5 day history of right arm pain and this admitted for further workup.  Right arm pain and swelling History of gout Admitted for gout on 02/23/2023 at that time was treated with prednisone  taper.  He was started on allopurinol  about 1 month ago for gout prophylaxis, originally was prescribed 50 mg every other day but had been taking 100 mg every other day.  Today  patient has significant swelling of the right upper extremity.  Aspiration of the wrist showed synovial fluid with 3400 white blood cells per millimeter cube.  Neutrophil predominance (96%), and no crystals.  Would expect higher synovial fluid white blood cell count with septic arthritis or crystals in acute gout flare, though history of similar gout flare of the affected extremity is suspicious for  repeat gout flare.  No clear signs of skin changes to suggest cellulitis.  The patient is on anticoagulation due to history of pulmonary embolism and DVT which decreases suspicion for DVT but doesn't rule it out. Given pt worked out prior to onset of the pain, would get shoulder imaging to look for RC injury if all other causes are ruled out.  Plan: - MRI of the wrist and hand pending - Right shoulder x-rays pending - Trend CBC - Follow-up synovial fluid studies - Oxycodone  5 mg every 6 hours as needed for pain - Tylenol  650 mg every 6 hours as needed - Started on ceftriaxone  and vancomycin  in ED - Right upper extremity doppler  CKD 4 HFpEF Volume status improved from prior, no signs of acute exacerbation of his heart failure and creatinine at baseline.  He does have residual edema of the residual right lower extremity and left lower extremity but states he has had difficulty putting on his compression stockings recently.  Denies chest pain, shortness of breath, orthopnea.  Plan: - Continue Lasix  80 mg BID - Trend CMP  Type 2 diabetes Hypoglycemia Diabetic neuropathy On Tresiba  40 units daily as well as sliding scale insulin  at home.  Hypoglycemic on admission chemistry panel.  A1c perhaps overly well-controlled at 4.9 last month.  Previously on 37 units Tresiba  daily prior to admission in February.  Would likely benefit from dose reduction at time of discharge should he not be started on steroids. Plan: - Hold Semglee  35 units daily, start SSI for now - CBGs - Gabapentin  100, 100, 300 mg  morning, afternoon, and night respectively  Hypertension Continue current regiment, amlodipine  10, Coreg  25 twice daily, hydralazine  100 3 times daily  GERD - Continue Protonix  20 mg daily Constipation - Senna and MiraLAX  as needed Urinary retention - Continue tamsulosin  and oxybutynin  Anemia Ferritin and iron studies difficult to interpret in setting of acute inflammation.  Likely chronic disease versus anemia of chronic kidney disease. - Daily CBC  Diet: NPO VTE: DOAC IVF: None,None Code: Full  Prior to Admission Living Arrangement:  Home Anticipated Discharge Location: Home Barriers to Discharge: Clinical improvement  Dispo: Admit patient to Observation with expected length of stay less than 2 midnights.  Signed: Gabino Boga, MD Internal Medicine Resident PGY-1  04/27/2023, 5:32 AM

## 2023-04-27 NOTE — Progress Notes (Signed)
                  Subjective:   Summary: 48 yo M with CKD IV, AKA, presenting with R arm and shoulder pain and swelling, admitted for evaluation for possible septic arthritis and further workup  Patient continues to endorse significant pain in his right arm and shoulder. He is very concerned about being able to attend his upcoming college graduation.   Objective:  Vital signs in last 24 hours: Vitals:   04/26/23 2338 04/27/23 0300 04/27/23 0457 04/27/23 0811  BP:  (!) 156/86 (!) 167/72 139/74  Pulse:  91 98 (!) 102  Resp:  18 16 16   Temp: 98.7 F (37.1 C) 98.3 F (36.8 C) 99.6 F (37.6 C) 99.9 F (37.7 C)  TempSrc: Oral Oral Oral Oral  SpO2:  98% 96% 96%  Weight:      Height:       Supplemental O2: Room Air SpO2: 96 %  Physical Exam:  Constitutional: no acute distress. Avoids moving R arm and shoulder Extremities: significant swelling of RUE from fingertips to shoulder. Mobility limited by pain, full active range of motion. Warm and well-perfused, palpable radial pulses. Sensation intact. No overlying skin changes.  Neuro: A&O x 3  Assessment/Plan:   Right arm pain and swelling Inflammatory arthropathy Still unsure etiology of patient's pain and swelling. Arthrocentesis of the right wrist showed 3k WBC, no crystals. Unlikely to be a gout flare. Lower suspicion for septic arthritis, though not completely ruled out. MRI wrist shows findings suspicious for multiple etiologies including cellulitis, tenosynovitis, subsheath tear, carpal tunnel. Preliminary read of DVT ultrasound is negative for acute DVT. Will need to rule out rheumatoid arthritis and lupus.  - Continue Linezolid  and ceftriaxone  until synovial fluid culture shows no growth for 48 hours - ANA w/reflex labs - Tylenol  1000mg  q8h, oral dilaudid  2mg  q6h prn, gabapentin  100mg  TID, Voltaren  gel - PT/OT ordered  T2DM Blood sugars have been at goal during this admission - SSI TID and at bedtime  HFpEF: No signs of  acute HF exacerbation. Continue home Lasix  80mg  BID HTN: Continue home amlodipine  10mg , Coreg  25mg  BID, Hydralazine  100mg  q8h CKD IV: Renal function at baseline. Continue to monitor Urinary retention: home tamsulosin  Constipation: miralax  17mg , senna prn  Diet: Regular VTE: Eliquis  Code: Full  Dispo: Anticipated discharge to home vs SNF pending medical stability and PT/OT evaluation  Redell Burnet, MD PGY-1 Internal Medicine Resident Pager Number [phone] Please contact the on call pager after 5 pm and on weekends at [phone].

## 2023-04-27 NOTE — ED Provider Notes (Signed)
  Provider Note MRN:  [id number]  Arrival date & time: 04/27/23    ED Course and Medical Decision Making  Assumed care of patient at sign-out or upon transfer.  Wrist gout versus septic joint awaiting fluid analysis.  Synovial fluid analysis is without crystals, WBC count is not significantly elevated.  This raises concern for possibly cellulitis.  On my exam of the hand is quite swollen, question deeper space infection versus cellulitis.  Case discussed with internal medicine for admission, will obtain MRI and they accept patient for admission.  Procedures  Final Clinical Impressions(s) / ED Diagnoses     ICD-10-CM   1. Right hand pain  M79.641     2. History of gout  Z87.39       ED Discharge Orders     None       Discharge Instructions   None     Merrick Abe. Harless Lien, MD [facility] [email or web address]    Edson Graces, MD 04/27/23 [phone]

## 2023-04-27 NOTE — Plan of Care (Signed)

## 2023-04-27 NOTE — Evaluation (Signed)
 Physical Therapy Evaluation Patient Details Name: [name] MRN: [id number] DOB: July 26, 1975 Today's Date: 04/27/2023  History of Present Illness  Pt is a 48 y/o M admitted on 04/26/23 after presenting with c/o RUE pain. Pt admitted for work-up. PMH: DM2, HFpEF, CKD4, PE on Eliquis , gout, R AKA 2/2 osteomyelitis  Clinical Impression  Pt seen for PT evaluation with pt reluctantly agreeable to tx. PLOF/home set up information below taken from previous chart entry but pt confirms. Pt reports he's still been able to do all the things he needs despite pain but pt appears to have primarily used w/c vs walker as he reports he did not want to weight bear through RUE. On this date, pt is able to complete bed mobility with mod I, extra time 2/2 pain. Pt voids with urinal sitting EOB without assistance. Pt politely declines OOB mobility 2/2 pain but is able to scoot along EOB with mod I. Anticipate pt will mobilize well at w/c level until RUE pain improves to the point he can tolerate weightbearing & use of a walker.      If plan is discharge home, recommend the following: A little help with walking and/or transfers;A little help with bathing/dressing/bathroom;Assistance with cooking/housework;Assist for transportation;Help with stairs or ramp for entrance   Can travel by private vehicle        Equipment Recommendations None recommended by PT  Recommendations for Other Services       Functional Status Assessment Patient has had a recent decline in their functional status and demonstrates the ability to make significant improvements in function in a reasonable and predictable amount of time.     Precautions / Restrictions Precautions Precautions: Fall Precaution/Restrictions Comments: old R AKA Restrictions Weight Bearing Restrictions Per Provider Order: No      Mobility  Bed Mobility Overal bed mobility: Modified Independent Bed Mobility: Supine to Sit, Sit to Supine     Supine to sit:  Modified independent (Device/Increase time), Used rails Sit to supine: Modified independent (Device/Increase time), HOB elevated, Used rails   General bed mobility comments: extra time    Transfers                   General transfer comment: politely declines OOB mobility but pt able to scoot along EOB to L with mod I    Ambulation/Gait                  Stairs            Wheelchair Mobility     Tilt Bed    Modified Rankin (Stroke Patients Only)       Balance Overall balance assessment: Needs assistance Sitting-balance support: No upper extremity supported, Feet supported Sitting balance-[name]: Normal Sitting balance - Comments: able to scoot to EOB to use urinal to void without assistance without LOB                                     Pertinent Vitals/Pain Pain Assessment Pain Assessment: 0-10 Pain Score: 10-Worst pain ever Pain Location: RUE Pain Descriptors / Indicators: Discomfort, Grimacing, Guarding Pain Intervention(s): Monitored during session, Limited activity within patient's tolerance, RN gave pain meds during session, Repositioned, Utilized relaxation techniques    Home Living Family/patient expects to be discharged to:: Private residence Living Arrangements: Alone Available Help at Discharge: Personal care attendant;Available PRN/intermittently Type of Home: Apartment Home Access: Level entry  Home Layout: One level Home Equipment: Agricultural consultant (2 wheels);Wheelchair - manual;Tub bench Additional Comments: PCA M-F 7-3, assists with cleaning, some cooking,    Prior Function Prior Level of Function : Independent/Modified Independent;Other (comment)             Mobility Comments: Uses a RW in the home (with/without prosthesis); uses a wheelchair for community access; keeps a RW at the gym to use ADLs Comments: Independent with ADLs; aide assists with cooking and cleaning     Extremity/Trunk  Assessment   Upper Extremity Assessment Upper Extremity Assessment: Right hand dominant;RUE deficits/detail RUE Deficits / Details: edema RUE: Unable to fully assess due to pain;Shoulder pain at rest    Lower Extremity Assessment Lower Extremity Assessment: RLE deficits/detail RLE Deficits / Details: chronic R AKA    Cervical / Trunk Assessment Cervical / Trunk Assessment: Normal  Communication   Communication Communication: No apparent difficulties    Cognition Arousal: Alert Behavior During Therapy: WFL for tasks assessed/performed   PT - Cognitive impairments: No apparent impairments                         Following commands: Intact       Cueing Cueing Techniques: Verbal cues     General Comments      Exercises     Assessment/Plan    PT Assessment Patient needs continued PT services  PT Problem List Decreased strength;Pain;Decreased range of motion;Decreased activity tolerance;Decreased balance;Decreased mobility       PT Treatment Interventions Balance training;DME instruction;Gait training;Neuromuscular re-education;Functional mobility training;Therapeutic activities;Therapeutic exercise;Manual techniques;Modalities;Patient/family education;Wheelchair mobility training    PT Goals (Current goals can be found in the Care Plan section)  Acute Rehab PT Goals Patient Stated Goal: decreased pain, figure out what's causing RUE pain PT Goal Formulation: With patient Time For Goal Achievement: 05/11/23 Potential to Achieve Goals: Good Additional Goals Additional Goal #1: Pt will propel w/c x 150 ft with mod I to increase independence with mobility.    Frequency Min 2X/week     Co-evaluation               AM-PAC PT "6 Clicks" Mobility  Outcome Measure Help needed turning from your back to your side while in a flat bed without using bedrails?: None Help needed moving from lying on your back to sitting on the side of a flat bed without using  bedrails?: A Little Help needed moving to and from a bed to a chair (including a wheelchair)?: A Little Help needed standing up from a chair using your arms (e.g., wheelchair or bedside chair)?: A Lot Help needed to walk in hospital room?: A Lot Help needed climbing 3-5 steps with a railing? : Total 6 Click Score: 15    End of Session   Activity Tolerance: Patient limited by pain Patient left: in bed;with call bell/phone within reach;with bed alarm set (RUE elevated on pillows)   PT Visit Diagnosis: Pain;Other abnormalities of gait and mobility (R26.89);Difficulty in walking, not elsewhere classified (R26.2);Muscle weakness (generalized) (M62.81) Pain - Right/Left: Right Pain - part of body: Arm    Time: 4259-5638 PT Time Calculation (min) (ACUTE ONLY): 21 min   Charges:   PT Evaluation $PT Eval Low Complexity: 1 Low   PT General Charges $$ ACUTE PT VISIT: 1 Visit         [name], PT, DPT 04/27/23, 9:13 AM   [name] 04/27/2023, 9:12 AM

## 2023-04-28 ENCOUNTER — Inpatient Hospital Stay (HOSPITAL_COMMUNITY)

## 2023-04-28 DIAGNOSIS — E11649 Type 2 diabetes mellitus with hypoglycemia without coma: Secondary | ICD-10-CM | POA: Diagnosis present

## 2023-04-28 DIAGNOSIS — M70811 Other soft tissue disorders related to use, overuse and pressure, right shoulder: Secondary | ICD-10-CM | POA: Diagnosis not present

## 2023-04-28 DIAGNOSIS — I130 Hypertensive heart and chronic kidney disease with heart failure and stage 1 through stage 4 chronic kidney disease, or unspecified chronic kidney disease: Secondary | ICD-10-CM | POA: Diagnosis present

## 2023-04-28 DIAGNOSIS — M79601 Pain in right arm: Secondary | ICD-10-CM | POA: Diagnosis present

## 2023-04-28 DIAGNOSIS — I5032 Chronic diastolic (congestive) heart failure: Secondary | ICD-10-CM | POA: Diagnosis present

## 2023-04-28 DIAGNOSIS — Z993 Dependence on wheelchair: Secondary | ICD-10-CM | POA: Diagnosis not present

## 2023-04-28 DIAGNOSIS — N184 Chronic kidney disease, stage 4 (severe): Secondary | ICD-10-CM | POA: Diagnosis present

## 2023-04-28 DIAGNOSIS — M7591 Shoulder lesion, unspecified, right shoulder: Secondary | ICD-10-CM | POA: Diagnosis present

## 2023-04-28 DIAGNOSIS — Z86711 Personal history of pulmonary embolism: Secondary | ICD-10-CM | POA: Diagnosis not present

## 2023-04-28 DIAGNOSIS — E1122 Type 2 diabetes mellitus with diabetic chronic kidney disease: Secondary | ICD-10-CM | POA: Diagnosis present

## 2023-04-28 DIAGNOSIS — M25511 Pain in right shoulder: Secondary | ICD-10-CM | POA: Diagnosis not present

## 2023-04-28 DIAGNOSIS — Z6841 Body Mass Index (BMI) 40.0 and over, adult: Secondary | ICD-10-CM | POA: Diagnosis not present

## 2023-04-28 DIAGNOSIS — M109 Gout, unspecified: Secondary | ICD-10-CM | POA: Diagnosis present

## 2023-04-28 DIAGNOSIS — Z794 Long term (current) use of insulin: Secondary | ICD-10-CM | POA: Diagnosis not present

## 2023-04-28 DIAGNOSIS — M1990 Unspecified osteoarthritis, unspecified site: Secondary | ICD-10-CM | POA: Diagnosis not present

## 2023-04-28 DIAGNOSIS — Z79899 Other long term (current) drug therapy: Secondary | ICD-10-CM | POA: Diagnosis not present

## 2023-04-28 DIAGNOSIS — Z7901 Long term (current) use of anticoagulants: Secondary | ICD-10-CM | POA: Diagnosis not present

## 2023-04-28 DIAGNOSIS — K5900 Constipation, unspecified: Secondary | ICD-10-CM | POA: Diagnosis present

## 2023-04-28 DIAGNOSIS — M65931 Unspecified synovitis and tenosynovitis, right forearm: Secondary | ICD-10-CM | POA: Diagnosis present

## 2023-04-28 DIAGNOSIS — R609 Edema, unspecified: Secondary | ICD-10-CM | POA: Diagnosis not present

## 2023-04-28 DIAGNOSIS — Z89611 Acquired absence of right leg above knee: Secondary | ICD-10-CM | POA: Diagnosis not present

## 2023-04-28 DIAGNOSIS — K219 Gastro-esophageal reflux disease without esophagitis: Secondary | ICD-10-CM | POA: Diagnosis present

## 2023-04-28 DIAGNOSIS — E1140 Type 2 diabetes mellitus with diabetic neuropathy, unspecified: Secondary | ICD-10-CM | POA: Diagnosis present

## 2023-04-28 DIAGNOSIS — R339 Retention of urine, unspecified: Secondary | ICD-10-CM | POA: Diagnosis present

## 2023-04-28 DIAGNOSIS — R531 Weakness: Secondary | ICD-10-CM | POA: Diagnosis present

## 2023-04-28 DIAGNOSIS — Z86718 Personal history of other venous thrombosis and embolism: Secondary | ICD-10-CM | POA: Diagnosis not present

## 2023-04-28 DIAGNOSIS — M1288 Other specific arthropathies, not elsewhere classified, other specified site: Secondary | ICD-10-CM | POA: Diagnosis present

## 2023-04-28 DIAGNOSIS — M79641 Pain in right hand: Secondary | ICD-10-CM | POA: Diagnosis not present

## 2023-04-28 DIAGNOSIS — M7581 Other shoulder lesions, right shoulder: Secondary | ICD-10-CM | POA: Diagnosis not present

## 2023-04-28 DIAGNOSIS — Z888 Allergy status to other drugs, medicaments and biological substances status: Secondary | ICD-10-CM | POA: Diagnosis not present

## 2023-04-28 LAB — PTH, INTACT AND CALCIUM
Calcium, Total (PTH): 9.1 mg/dL (ref 8.7–10.2)
PTH: 138 pg/mL — ABNORMAL HIGH (ref 15–65)

## 2023-04-28 LAB — CBC
HCT: 27.6 % — ABNORMAL LOW (ref 39.0–52.0)
Hemoglobin: 8.7 g/dL — ABNORMAL LOW (ref 13.0–17.0)
MCH: 28.8 pg (ref 26.0–34.0)
MCHC: 31.5 g/dL (ref 30.0–36.0)
MCV: 91.4 fL (ref 80.0–100.0)
Platelets: 195 10*3/uL (ref 150–400)
RBC: 3.02 MIL/uL — ABNORMAL LOW (ref 4.22–5.81)
RDW: 16.4 % — ABNORMAL HIGH (ref 11.5–15.5)
WBC: 9.2 10*3/uL (ref 4.0–10.5)
nRBC: 0 % (ref 0.0–0.2)

## 2023-04-28 LAB — BASIC METABOLIC PANEL WITH GFR
Anion gap: 12 (ref 5–15)
BUN: 61 mg/dL — ABNORMAL HIGH (ref 6–20)
CO2: 21 mmol/L — ABNORMAL LOW (ref 22–32)
Calcium: 8.5 mg/dL — ABNORMAL LOW (ref 8.9–10.3)
Chloride: 102 mmol/L (ref 98–111)
Creatinine, Ser: 2.95 mg/dL — ABNORMAL HIGH (ref 0.61–1.24)
GFR, Estimated: 26 mL/min — ABNORMAL LOW (ref >60)
Glucose, Bld: 85 mg/dL (ref 70–99)
Potassium: 4 mmol/L (ref 3.5–5.1)
Sodium: 135 mmol/L (ref 135–145)

## 2023-04-28 LAB — GLUCOSE, CAPILLARY
Glucose-Capillary: 94 mg/dL (ref 70–99)
Glucose-Capillary: 107 mg/dL — ABNORMAL HIGH (ref 70–99)
Glucose-Capillary: 129 mg/dL — ABNORMAL HIGH (ref 70–99)
Glucose-Capillary: 132 mg/dL — ABNORMAL HIGH (ref 70–99)

## 2023-04-28 LAB — ANA W/REFLEX IF POSITIVE: Anti Nuclear Antibody (ANA): NEGATIVE

## 2023-04-28 MED ORDER — LIDOCAINE 5 % EX PTCH
1.0000 | MEDICATED_PATCH | CUTANEOUS | Status: DC
  Administered 2023-04-28 – 2023-04-30 (×3): 1 via TRANSDERMAL
  Filled 2023-04-28 (×3): qty 1

## 2023-04-28 NOTE — Plan of Care (Signed)

## 2023-04-28 NOTE — Care Management Obs Status (Signed)
 MEDICARE OBSERVATION STATUS NOTIFICATION   Patient Details  Name: [name] MRN: [id number] Date of Birth: 05/16/1975   Medicare Observation Status Notification Given:  Yes    Omie Bickers, RN 04/28/2023, 10:37 AM

## 2023-04-28 NOTE — Progress Notes (Signed)
                  Subjective:   Summary: 48 yo M with CKD IV, R AKA, presenting with R arm and shoulder pain and swelling, admitted for evaluation for possible septic arthritis and further workup of inflammatory arthropathy  Patient's pain and swelling are somewhat improved today.  He was able to demonstrate active and passive range of motion of the elbow and wrist without significant pain.  Still has mobility limited by pain of his right shoulder.  Discussed the workup that is still pending and current plan of care.  Encouraged by the improvement in his symptoms.  Objective:  Vital signs in last 24 hours: Vitals:   04/27/23 0811 04/27/23 1538 04/27/23 2052 04/28/23 0535  BP: 139/74 (!) 159/72 (!) 175/94 (!) 160/84  Pulse: (!) 102 99 (!) 102 97  Resp: 16 16 18 18   Temp: 99.9 F (37.7 C) 98 F (36.7 C) 98.9 F (37.2 C) 98.4 F (36.9 C)  TempSrc: Oral Oral Oral Oral  SpO2: 96% 97% 94% 95%  Weight:      Height:       Supplemental O2: Room Air SpO2: 95 %  Physical Exam:  Constitutional: Well-appearing, no acute distress Extremities: Edematous right upper extremity, improved from yesterday. Shoulder range of motion limited by pain.  Moderately limited passive range of motion of right elbow and wrist, full passive range of motion Neuro: A and O x 3  Assessment/Plan:   48 yo M with R AKA presenting with R arm and shoulder pain of currently unknown etiology. Workup for autoimmune/inflammatory conditions still pending, though I am encouraged by his spontaneous improvement in his symptoms. Continuing antibiotics for now  R arm pain and swelling Inflammatory arthropathy Pain and swelling are somewhat improved today.  Pain now seems localized to right shoulder.  Still unknown etiology of his symptoms though I am encouraged by spontaneous improvement. - MRI R shoulder - Continue Linezolid  and ceftriaxone  until synovial fluid culture shows no growth for 48 hours. Currently no growth <24  hours - ANA w/reflex, anti-CCP pending - Tylenol  1000mg  q8h, oral dilaudid  2mg  q6h prn, gabapentin  100mg  TID, Voltaren  gel, lidocaine  patch - PT/OT  T2DM: blood sugars have been at goal with only SSI qid. Continue current regimen HFpEF: no signs of acute HF exacerbation. Continue home Lasix  80mg  BID HTN: Continue home antihypertensives: amlodipine  10mg , Coreg  25mg  BID, Hydralazine  100mg  q8h CKD IV: Renal function at baseline Urinary retention: home tamsulosin  Constipation: miralax , senna prn  Diet: Regular VTE: Eliquis  Code: Full  Dispo: Anticipated discharge to Home  Victor Michael, MD PGY-1 Internal Medicine Resident Pager Number [phone] Please contact the on call pager after 5 pm and on weekends at [phone].

## 2023-04-28 NOTE — Evaluation (Signed)
 Occupational Therapy Evaluation Patient Details Name: [name] MRN: [id number] DOB: February 08, 1975 Today's Date: 04/28/2023   History of Present Illness   Pt is a 48 y/o M admitted on 04/26/23 after presenting with c/o RUE pain. Pt admitted for work-up. PMH: DM2, HFpEF, CKD4, PE on Eliquis , gout, R AKA 2/2 osteomyelitis     Clinical Impressions Pt reports ind at baseline with ADL and uses RW for mobility in home/ wheelchair out of home. Has an aide that comes to assist with IADL tasks. Pt currently needing min-mod A for ADLs, mod I for bed mobility, declines transfers 2/2 pain with weightbearing in RUE, educated pt on elevation and provide with squeeze ball for edema control. Pt presenting with impairments listed below, will follow acutely. Recommend OP OT at d/c.      If plan is discharge home, recommend the following:   A little help with walking and/or transfers;A little help with bathing/dressing/bathroom;Assistance with cooking/housework;Direct supervision/assist for medications management;Direct supervision/assist for financial management;Assist for transportation;Help with stairs or ramp for entrance     Functional Status Assessment   Patient has had a recent decline in their functional status and demonstrates the ability to make significant improvements in function in a reasonable and predictable amount of time.     Equipment Recommendations   Other (comment) (TBD pending pain)     Recommendations for Other Services   PT consult     Precautions/Restrictions   Precautions Precautions: Fall Precaution/Restrictions Comments: old R AKA Restrictions Weight Bearing Restrictions Per Provider Order: No     Mobility Bed Mobility Overal bed mobility: Modified Independent Bed Mobility: Supine to Sit, Sit to Supine     Supine to sit: Modified independent (Device/Increase time), Used rails Sit to supine: Modified independent (Device/Increase time), HOB elevated,  Used rails        Transfers                   General transfer comment: declines, 2/2 RUE pain      Balance                                           ADL either performed or assessed with clinical judgement   ADL Overall ADL's : Needs assistance/impaired Eating/Feeding: Set up;Sitting   Grooming: Set up;Sitting   Upper Body Bathing: Minimal assistance;Sitting   Lower Body Bathing: Moderate assistance;Sitting/lateral leans   Upper Body Dressing : Minimal assistance;Sitting   Lower Body Dressing: Moderate assistance;Sitting/lateral leans   Toilet Transfer: Moderate assistance   Toileting- Clothing Manipulation and Hygiene: Moderate assistance       Functional mobility during ADLs: Moderate assistance       Vision   Vision Assessment?: No apparent visual deficits     Perception Perception: Not tested       Praxis Praxis: Not tested       Pertinent Vitals/Pain Pain Assessment Pain Assessment: Faces Pain Score: 8  Faces Pain Scale: Hurts whole lot Pain Location: RUE Pain Descriptors / Indicators: Discomfort, Grimacing, Guarding Pain Intervention(s): Limited activity within patient's tolerance, Monitored during session, Repositioned     Extremity/Trunk Assessment Upper Extremity Assessment Upper Extremity Assessment: Right hand dominant RUE Deficits / Details: Shoulder AROM ~10*, PROM ~75*, Elbow PROM ~30*, wrist AROM WNL, digit composite flex/ext WNL, AROM limited by pain/edema RUE: Unable to fully assess due to pain   Lower Extremity Assessment Lower  Extremity Assessment: Defer to PT evaluation   Cervical / Trunk Assessment Cervical / Trunk Assessment: Normal   Communication Communication Communication: No apparent difficulties   Cognition Arousal: Alert Behavior During Therapy: WFL for tasks assessed/performed Cognition: No apparent impairments                               Following commands: Intact        Cueing  General Comments   Cueing Techniques: Verbal cues  VSS   Exercises     Shoulder Instructions      Home Living Family/patient expects to be discharged to:: Private residence Living Arrangements: Alone Available Help at Discharge: Personal care attendant;Available PRN/intermittently Type of Home: Apartment Home Access: Level entry     Home Layout: One level     Bathroom Shower/Tub: Chief Strategy Officer: Standard Bathroom Accessibility: Yes   Home Equipment: Agricultural consultant (2 wheels);Wheelchair - manual;Tub bench   Additional Comments: PCA M-F 7-3, assists with cleaning, some cooking,      Prior Functioning/Environment Prior Level of Function : Independent/Modified Independent;Other (comment)             Mobility Comments: Uses a RW in the home (with/without prosthesis); uses a wheelchair for community access; keeps a RW at the gym to use ADLs Comments: Independent with ADLs; aide assists with cooking and cleaning    OT Problem List: Decreased strength;Decreased range of motion;Decreased activity tolerance;Impaired balance (sitting and/or standing);Decreased cognition;Decreased safety awareness   OT Treatment/Interventions: Self-care/ADL training;Therapeutic exercise;Energy conservation;DME and/or AE instruction;Therapeutic activities;Patient/family education;Balance training      OT Goals(Current goals can be found in the care plan section)   Acute Rehab OT Goals Patient Stated Goal: none stated OT Goal Formulation: With patient Time For Goal Achievement: 05/12/23 Potential to Achieve Goals: Good   OT Frequency:  Min 2X/week    Co-evaluation              AM-PAC OT "6 Clicks" Daily Activity     Outcome Measure Help from another person eating meals?: None Help from another person taking care of personal grooming?: A Little Help from another person toileting, which includes using toliet, bedpan, or urinal?: A Little Help  from another person bathing (including washing, rinsing, drying)?: A Lot Help from another person to put on and taking off regular upper body clothing?: A Lot Help from another person to put on and taking off regular lower body clothing?: A Lot 6 Click Score: 16   End of Session Nurse Communication: Mobility status  Activity Tolerance: Patient tolerated treatment well Patient left: in bed;with call bell/phone within reach;with bed alarm set  OT Visit Diagnosis: Unsteadiness on feet (R26.81);Other abnormalities of gait and mobility (R26.89);Muscle weakness (generalized) (M62.81)                Time: 1610-9604 OT Time Calculation (min): 22 min Charges:  OT General Charges $OT Visit: 1 Visit OT Evaluation $OT Eval Moderate Complexity: 1 Mod  Kharma Sampsel K, OTD, OTR/L SecureChat Preferred Acute Rehab [phone]   Atiana Levier K Koonce 04/28/2023, 3:04 PM

## 2023-04-29 LAB — RHEUMATOID FACTOR: Rheumatoid fact SerPl-aCnc: 19.4 [IU]/mL — ABNORMAL HIGH

## 2023-04-29 LAB — GLUCOSE, CAPILLARY
Glucose-Capillary: 81 mg/dL (ref 70–99)
Glucose-Capillary: 129 mg/dL — ABNORMAL HIGH (ref 70–99)
Glucose-Capillary: 137 mg/dL — ABNORMAL HIGH (ref 70–99)
Glucose-Capillary: 116 mg/dL — ABNORMAL HIGH (ref 70–99)

## 2023-04-29 NOTE — Progress Notes (Signed)
                  Subjective:   Summary: 48 yo M with CKD IV, R AKA, presenting with R arm and shoulder pain and swelling, admitted for evaluation for possible septic arthritis and further workup of inflammatory arthropathy  Right arm pain has continued to improve although the swelling is stable.  His MRI did show signs of rotator cuff tendinosis and partial tear.  He would like to try arm compression and is agreeable to do more arm exercises.  Objective:  Vital signs in last 24 hours: Vitals:   04/28/23 1431 04/28/23 2023 04/28/23 2119 04/29/23 0458  BP: (!) 164/71 (!) 154/68 (!) 158/70 (!) 151/71  Pulse: 96 91  93  Resp: 18 18  18   Temp: 98.1 F (36.7 C) 98.5 F (36.9 C)  98.7 F (37.1 C)  TempSrc: Oral Oral  Oral  SpO2: 94% 96%  95%  Weight:      Height:       Supplemental O2: Room Air SpO2: 95 %  Physical Exam:  Constitutional: Well-appearing, no acute distress Extremities: Edematous right upper extremity, stable from yesterday. Shoulder range of motion limited by pain.  Mildly limited active range of motion of the right wrist and elbow  Assessment/Plan:   48 yo M with R AKA presenting with R arm and shoulder pain of currently unknown etiology. Workup for autoimmune/inflammatory conditions still pending, though I am encouraged by his spontaneous improvement in his symptoms. Continuing antibiotics for now  R arm pain and swelling Inflammatory arthropathy Continues to have improvement in his symptoms although his swelling is stable from yesterday.  MRI showed tendinosis and small tear in the supraspinatus tendon as well as mild bursitis and possible myositis.  Discussed with Dr. Marchia Seton, who recommends shoulder sling for comfort but does not need full immobilization and will follow-up outpatient.  We will complete antibiotic therapy today with synovial fluid cultures negative at 2 days.  ANA negative with rheumatoid factor and CCP pending - Tylenol  1000mg  q8h,  oral dilaudid  2mg  q6h prn, gabapentin  100mg  TID, Voltaren  gel, lidocaine  patch - PT/OT - If able to maneuver his wheelchair then I think he is close to discharge today or tomorrow  T2DM: blood sugars have been at goal with only SSI qid. Continue current regimen HFpEF: no signs of acute HF exacerbation. Continue home Lasix  80mg  BID HTN: Continue home antihypertensives: amlodipine  10mg , Coreg  25mg  BID, Hydralazine  100mg  q8h CKD IV: Renal function at baseline Urinary retention: home tamsulosin  Constipation: miralax , senna prn  Diet: Regular VTE: Eliquis  Code: Full  Dispo: Anticipated discharge to Home  Cleven [place], DO Internal Medicine Resident, PGY-2 Please contact the on call pager at [phone] for any urgent or emergent needs. 6:48 AM 04/29/2023

## 2023-04-29 NOTE — Plan of Care (Signed)

## 2023-04-29 NOTE — Progress Notes (Signed)
 Orthopedic Tech Progress Note Patient Details:  [name] 1975/03/06 [id number]  Ortho Devices Type of Ortho Device: Arm sling Ortho Device/Splint Location: RUE Ortho Device/Splint Interventions: Application   Post Interventions Patient Tolerated: Well  [name] 04/29/2023, 11:41 AM

## 2023-04-30 DIAGNOSIS — M79641 Pain in right hand: Principal | ICD-10-CM

## 2023-04-30 DIAGNOSIS — Z794 Long term (current) use of insulin: Secondary | ICD-10-CM

## 2023-04-30 DIAGNOSIS — M79601 Pain in right arm: Secondary | ICD-10-CM

## 2023-04-30 DIAGNOSIS — E1122 Type 2 diabetes mellitus with diabetic chronic kidney disease: Secondary | ICD-10-CM

## 2023-04-30 DIAGNOSIS — N184 Chronic kidney disease, stage 4 (severe): Secondary | ICD-10-CM

## 2023-04-30 LAB — BODY FLUID CULTURE W GRAM STAIN

## 2023-04-30 LAB — GLUCOSE, CAPILLARY
Glucose-Capillary: 117 mg/dL — ABNORMAL HIGH (ref 70–99)
Glucose-Capillary: 163 mg/dL — ABNORMAL HIGH (ref 70–99)
Glucose-Capillary: 110 mg/dL — ABNORMAL HIGH (ref 70–99)
Glucose-Capillary: 166 mg/dL — ABNORMAL HIGH (ref 70–99)

## 2023-04-30 LAB — CYCLIC CITRUL PEPTIDE ANTIBODY, IGG/IGA: CCP Antibodies IgG/IgA: 5 U (ref 0–19)

## 2023-04-30 MED ORDER — INSULIN GLARGINE-YFGN 100 UNIT/ML ~~LOC~~ SOLN: 15.0000 [IU] | Freq: Every day | SUBCUTANEOUS | Status: DC

## 2023-04-30 MED ORDER — ALLOPURINOL 100 MG PO TABS
50.0000 mg | ORAL_TABLET | ORAL | Status: DC
  Administered 2023-04-30: 50 mg via ORAL
  Filled 2023-04-30: qty 1

## 2023-04-30 MED ORDER — INSULIN GLARGINE-YFGN 100 UNIT/ML ~~LOC~~ SOLN
15.0000 [IU] | Freq: Every day | SUBCUTANEOUS | Status: DC
  Administered 2023-04-30 – 2023-05-01 (×2): 15 [IU] via SUBCUTANEOUS
  Filled 2023-04-30 (×2): qty 0.15

## 2023-04-30 MED ORDER — DICLOFENAC SODIUM 1 % EX GEL
2.0000 g | Freq: Four times a day (QID) | CUTANEOUS | Status: DC
  Administered 2023-04-30 – 2023-05-01 (×5): 2 g via TOPICAL
  Filled 2023-04-30 (×2): qty 100

## 2023-04-30 NOTE — Progress Notes (Signed)
                  Subjective:   Summary: 48 yo M with CKD IV, R AKA, presenting with R arm and shoulder pain and swelling, admitted for evaluation for possible septic arthritis and further workup of inflammatory arthropathy. Currently treating for tendonitis/overuse injury of R shoulder.  Hospital Day 2  His arm does feel better today. Pain is well-controlled and he has been able to work with PT/OT.  He is agreeable to being discharged soon if PT goes well.  Objective:  Vital signs in last 24 hours: Vitals:   04/29/23 2038 04/29/23 2138 04/30/23 0545 04/30/23 0807  BP: (!) 147/67 (!) 152/74 (!) 150/71 (!) 161/77  Pulse: 87  93 92  Resp: 19  18 16   Temp: 98.6 F (37 C)  98.7 F (37.1 C) 98.4 F (36.9 C)  TempSrc: Oral  Oral Oral  SpO2: 95%  95% 97%  Weight:      Height:       Supplemental O2: Room Air SpO2: 97 %  Physical Exam:  Constitutional: Well-appearing, no acute distress Ext: Edematous right upper extremity, similar to yesterday. Arm in a sling Neuro: A&O x 3  Assessment/Plan:   48 yo M with R AKA presenting with right arm and shoulder pain, most likely due to overuse injury. We feel that infectious or  inflammatory causes of his symptoms have cautiously been ruled out, and are treating his overuse injury supportively. He will likely be able to discharge whenever he is capable of ambulating in his wheelchair.  R arm pain and swelling R shoulder tendinosis, possible supraspinatus tear His pain and mobility continue to slowly improve.  PT and OT have recommended continued outpatient therapy, which patient prefers to complete at outpatient facility rather than home health. When he is able to ambulate safely in his wheelchair and complete ADLs with his injured right shoulder he can likely be discharged. - Tylenol  1000mg  q8h, oral dilaudid  2mg  q6h prn, gabapentin  100mg  TID, Voltaren  gel, lidocaine  patch  - PT/OT  T2DM: blood sugars have been at goal with only SSI qid.  Continue current regimen HFpEF: no signs of acute HF exacerbation. Continue home Lasix  80mg  BID HTN: Continue home antihypertensives: amlodipine  10mg , Coreg  25mg  BID, Hydralazine  100mg  q8h CKD IV: Renal function at baseline Urinary retention: home tamsulosin  Constipation: miralax , senna prn  Diet: Regular VTE: Eliquis  Code: Full  Dispo: Anticipated discharge to Home pending medical stability.   Jayson Michael, MD PGY-1 Internal Medicine Resident Pager Number [phone] Please contact the on call pager after 5 pm and on weekends at [phone].

## 2023-04-30 NOTE — Plan of Care (Signed)

## 2023-04-30 NOTE — Progress Notes (Addendum)
 Occupational Therapy Treatment Patient Details Name: [name] MRN: [id number] DOB: 11/20/1975 Today's Date: 04/30/2023   History of present illness Pt is a 48 y/o M admitted on 04/26/23 after presenting with c/o RUE pain. MRI with supraspinatus tendon tear. PMH: DM2, HFpEF, CKD4, PE on Eliquis , gout, R AKA 2/2 osteomyelitis   OT comments  Pt progressing toward goals this session, able to transfer to w/c with minA to hold chair steady. Pt completes LB ADL and grooming task from w/c level. Pt able to propel w/c hallway distance x2 with multiple rest breaks. Pt reports R wrist pain but states he typically has gloves he uses when propelling w/c but they are not at the hospital currently. R pre-fab wrist splint ordered for comfort. Pt presenting with impairments listed below, will follow acutely. Continue to recommend OP OT at d/c.       If plan is discharge home, recommend the following:  A little help with walking and/or transfers;A little help with bathing/dressing/bathroom;Assistance with cooking/housework;Direct supervision/assist for medications management;Direct supervision/assist for financial management;Assist for transportation;Help with stairs or ramp for entrance   Equipment Recommendations  None recommended by OT    Recommendations for Other Services PT consult    Precautions / Restrictions Precautions Precautions: Fall Precaution/Restrictions Comments: old R AKA Required Braces or Orthoses: Sling;Other Brace Other Brace: RUE sling and prefab splint for comfort only Restrictions Weight Bearing Restrictions Per Provider Order: No       Mobility Bed Mobility Overal bed mobility: Modified Independent                  Transfers Overall transfer level: Needs assistance Equipment used: None Transfers: Bed to chair/wheelchair/BSC     Squat pivot transfers: Min assist       General transfer comment: min A to steady w/c, pt typically uses gloves to propel w/c  but they are not with him at this  time     Balance Overall balance assessment: Needs assistance Sitting-balance support: No upper extremity supported, Feet supported Sitting balance-[name]: Normal                                     ADL either performed or assessed with clinical judgement   ADL Overall ADL's : Needs assistance/impaired     Grooming: Oral care;Wash/dry face;Sitting;Set up Grooming Details (indicate cue type and reason): in w/c at sink             Lower Body Dressing: Supervision/safety;Sitting/lateral leans Lower Body Dressing Details (indicate cue type and reason): donning shorts Toilet Transfer: Minimal assistance;Squat-pivot Toilet Transfer Details (indicate cue type and reason): pivoting to w/c on pt's L side         Functional mobility during ADLs: Minimal assistance      Extremity/Trunk Assessment Upper Extremity Assessment Upper Extremity Assessment: Right hand dominant RUE Deficits / Details: shoulder AROM WFL, limited wrist/elbow movement from ace wrap but [name]   Lower Extremity Assessment Lower Extremity Assessment: Defer to PT evaluation        Vision   Vision Assessment?: No apparent visual deficits   Perception Perception Perception: Not tested   Praxis Praxis Praxis: Not tested   Communication Communication Communication: No apparent difficulties   Cognition Arousal: Alert Behavior During Therapy: WFL for tasks assessed/performed Cognition: No apparent impairments  Following commands: Intact        Cueing   Cueing Techniques: Verbal cues  Exercises      Shoulder Instructions       General Comments VSS    Pertinent Vitals/ Pain       Pain Assessment Pain Assessment: Faces Pain Score: 4  Faces Pain Scale: Hurts little more Pain Location: R wrist Pain Descriptors / Indicators: Discomfort, Grimacing, Guarding Pain Intervention(s): Limited activity  within patient's tolerance, Monitored during session, Repositioned  Home Living                                          Prior Functioning/Environment              Frequency  Min 2X/week        Progress Toward Goals  OT Goals(current goals can now be found in the care plan section)  Progress towards OT goals: Progressing toward goals  Acute Rehab OT Goals Patient Stated Goal: none stated OT Goal Formulation: With patient Time For Goal Achievement: 05/12/23 Potential to Achieve Goals: Good ADL Goals Pt Will Perform Upper Body Dressing: with supervision;sitting Pt Will Perform Lower Body Dressing: with supervision;sitting/lateral leans;sit to/from stand Pt Will Transfer to Toilet: with supervision;stand pivot transfer;with transfer board;bedside commode Pt/caregiver will Perform Home Exercise Program: Increased ROM;Increased strength;Right Upper extremity;With minimal assist;With written HEP provided  Plan      Co-evaluation                 AM-PAC OT "6 Clicks" Daily Activity     Outcome Measure   Help from another person eating meals?: None Help from another person taking care of personal grooming?: A Little Help from another person toileting, which includes using toliet, bedpan, or urinal?: A Little Help from another person bathing (including washing, rinsing, drying)?: A Little Help from another person to put on and taking off regular upper body clothing?: A Little Help from another person to put on and taking off regular lower body clothing?: A Little 6 Click Score: 19    End of Session    OT Visit Diagnosis: Unsteadiness on feet (R26.81);Other abnormalities of gait and mobility (R26.89);Muscle weakness (generalized) (M62.81)   Activity Tolerance Patient tolerated treatment well   Patient Left Other (comment) (in w/c, RN aware)   Nurse Communication Mobility status        Time: 1610-9604 OT Time Calculation (min): 47  min  Charges: OT General Charges $OT Visit: 1 Visit OT Treatments $Self Care/Home Management : 23-37 mins $Therapeutic Activity: 8-22 mins  [name] K, OTD, OTR/L SecureChat Preferred Acute Rehab [phone]   [name] 04/30/2023, 4:48 PM

## 2023-04-30 NOTE — Hospital Course (Addendum)
 R arm pain and swelling R shoulder tendinosis, possible supraspinatus tear  Patient presented with hx of R AKA presented with RUE pain and swelling from his fingers to his shoulder. He had a similar hospitalization February, at which time a clinical diagnosis of gouty arthritis was made based on MRI findings and elevated uric acid levels. Upon arrival to the ED this time, arthrocentesis was performed which showed no crystals and moderate inflammation with WBC to 3k. These findings made the medicine team less concerned for gout or other crystalopathy. Out of concern for septic arthritis, antibiotic coverage was started and continued until synovial culture showed no growth for 48 hours. Autoimmune workup was unrevealing. MRI of the wrist ruled out osteomyelitis. MRI of the shoulder showed tendinosis, possible supraspinatus tear, mild subacromial/subdeltoid bursitis. Findings were overall likely indicative of overuse injury. Orthopedics service was consulted who recommended an arm sling and outpatient follow up. PT/OT recommended outpatient physical and occupational therapy. Patient's pain and swelling improved with rest and supportive treatment. On.... patient was able to ambulate well in his wheelchair and was discharged with outpatient follow up.   T2DM: blood sugars remained at goal with qid SSI HFpEF: no signs of acute HF exacerbation. Home dose Lasix  was continued HTN: Continued home antihypertensives: amlodipine  10mg , Coreg  25mg  BID, Hydralazine  100mg  q8h CKD IV: Renal function at baseline Urinary retention: continued home tamsulosin  Constipation: miralax , senna prn

## 2023-04-30 NOTE — Plan of Care (Signed)
 Pt alert and oriented x4. At bedrest. Hob elevated. Skin warm and dry. No complaints voiced at this time. No s/s of hypo- nor hyperglycemia at this time. Ace wrap and sling to rt upper ext. Encouraged to call for assistance as needed. Call light in reach. Sr x2 elevated. Bed in low position.

## 2023-04-30 NOTE — TOC Initial Note (Signed)
 Transition of Care [facility]) - Initial/Assessment Note    Patient Details  Name: [name] MRN: [id number] Date of Birth: May 19, 1975  Transition of Care [facility]) CM/SW Contact:    Jeani Mill, RN Phone Number: 04/30/2023, 4:29 PM  Clinical Narrative:                 Spoke to patient regarding transition needs.  Patient lives alone and uses [place] access for transportation.  Patient's family can transport home.  Patient requesting new wheelchair. Zack with [employer] will do one time replacement. Patient agreeable to OP rehab [address]. Referral sent.  Address, Phone number and PCP verified. TOC will continue to follow for needs.  Expected Discharge Plan: OP Rehab Barriers to Discharge: Continued Medical Work up   Patient Goals and CMS Choice Patient states their goals for this hospitalization and ongoing recovery are:: return home CMS Medicare.gov Compare Post Acute Care list provided to:: Patient Choice offered to / list presented to : Patient      Expected Discharge Plan and Services   Discharge Planning Services: CM Consult   Living arrangements for the past 2 months: Apartment                                      Prior Living Arrangements/Services Living arrangements for the past 2 months: Apartment Lives with:: Self Patient language and need for interpreter reviewed:: Yes Do you feel safe going back to the place where you live?: Yes      Need for Family Participation in Patient Care: Yes (Comment) Care giver support system in place?: Yes (comment) Current home services: DME Criminal Activity/Legal Involvement Pertinent to Current Situation/Hospitalization: No - Comment as needed  Activities of Daily Living   ADL Screening (condition at time of admission) Independently performs ADLs?: No Does the patient have a NEW difficulty with bathing/dressing/toileting/self-feeding that is expected to last >3 days?: Yes (Initiates electronic notice to provider for  possible OT consult) Does the patient have a NEW difficulty with getting in/out of bed, walking, or climbing stairs that is expected to last >3 days?: Yes (Initiates electronic notice to provider for possible PT consult) Does the patient have a NEW difficulty with communication that is expected to last >3 days?: No Is the patient deaf or have difficulty hearing?: No Does the patient have difficulty seeing, even when wearing glasses/contacts?: No Does the patient have difficulty concentrating, remembering, or making decisions?: No  Permission Sought/Granted                  Emotional Assessment Appearance:: Appears stated age Attitude/Demeanor/Rapport: Gracious Affect (typically observed): Accepting Orientation: : Oriented to Self, Oriented to  Time, Oriented to Place, Oriented to Situation Alcohol / Substance Use: Not Applicable Psych Involvement: No (comment)  Admission diagnosis:  Right hand pain [M79.641] History of gout [Z87.39] Right arm pain [M79.601] Patient Active Problem List   Diagnosis Date Noted   Right arm pain 04/28/2023   Gout of right wrist 03/26/2023   Insomnia 03/26/2023   Volume overload 03/21/2023   Inflammatory arthropathy 02/23/2023   Pain and swelling of right wrist 02/22/2023   Elbow strain, left, initial encounter 06/06/2022   Medial epicondylitis of elbow, left 05/06/2022   Diabetic neuropathy (HCC) 01/24/2022   Trapezius strain, left, initial encounter 02/02/2021   Left shoulder pain 12/30/2020   Benign paroxysmal positional vertigo 09/14/2020   Anemia associated with  chronic renal failure    Hemoglobinuria 12/12/2018   OSA (obstructive sleep apnea) 02/28/2018   Acute on chronic diastolic heart failure (HCC) 02/02/2018   Preventative health care 08/15/2017   Erectile dysfunction 04/22/2017   History of pulmonary embolus (PE) 06/09/2016   Depression 05/04/2016   Hypogonadism in male 05/04/2016   Urinary retention 05/04/2016   Dyslipidemia  08/12/2014   Unilateral AKA, right (HCC) 12/26/2013   Hyperlipidemia 02/19/2013   S/P cardiac cath, 02/18/13, normal coronaries 02/19/2013   CKD (chronic kidney disease) stage 4, GFR 15-29 ml/min (HCC) 02/19/2013   Hypertension 07/24/2011   Long term current use of anticoagulant therapy 07/19/2011   History of DVT (deep vein thrombosis) 07/28/2009   Morbid obesity (HCC) 10/26/2005   Insulin  dependent type 2 diabetes mellitus, controlled (HCC) 01/09/2002   PCP:  Priscella Brooms, DO Pharmacy:   [facility] [address] ([place]), [place] - [address] [place] ([place]) [place] [address] Phone: [phone] Fax: [phone]  [facility] - [place], [place] - [facility] AT Portal to Registered [facility] [address] [place] [address] Phone: [phone] Fax: [phone]  [facility] [address] [place] [address] Phone: [phone] Fax: [phone]     Social Drivers of Health (SDOH) Social History: SDOH Screenings   Food Insecurity: No Food Insecurity (04/28/2023)  Housing: Low Risk  (04/28/2023)  Transportation Needs: No Transportation Needs (04/28/2023)  Utilities: Not At Risk (04/28/2023)  Alcohol Screen: Low Risk  (01/31/2023)  Depression (PHQ2-9): Low Risk  (04/26/2023)  Financial Resource Strain: Medium Risk (01/31/2023)  Physical Activity: Sufficiently Active (01/31/2023)  Social Connections: Socially Isolated (01/31/2023)  Stress: No Stress Concern Present (01/31/2023)  Tobacco Use: Low Risk  (04/26/2023)  Health Literacy: Adequate Health Literacy (01/31/2023)   SDOH Interventions:     Readmission Risk Interventions    03/14/2023    3:19 PM 10/03/2020    3:23 PM 08/23/2020    2:46 PM  Readmission Risk Prevention Plan  Transportation Screening Complete Complete Complete  PCP or Specialist Appt within 3-5 Days Complete Complete Complete  HRI or Home  Care Consult Complete Complete Complete  Social Work Consult for Recovery Care Planning/Counseling Complete Complete Complete  Palliative Care Screening Complete Not Applicable Not Applicable  Medication Review Oceanographer) Complete Complete Complete

## 2023-05-01 ENCOUNTER — Other Ambulatory Visit (HOSPITAL_COMMUNITY): Payer: Self-pay

## 2023-05-01 LAB — GLUCOSE, CAPILLARY: Glucose-Capillary: 96 mg/dL (ref 70–99)

## 2023-05-01 MED ORDER — TRESIBA FLEXTOUCH 200 UNIT/ML ~~LOC~~ SOPN
36.0000 [IU] | PEN_INJECTOR | SUBCUTANEOUS | 0 refills | Status: DC
  Filled 2023-05-01: qty 6, 33d supply, fill #0

## 2023-05-01 MED ORDER — HYDROMORPHONE HCL 2 MG PO TABS
2.0000 mg | ORAL_TABLET | Freq: Four times a day (QID) | ORAL | 0 refills | Status: AC | PRN
  Filled 2023-05-01: qty 6, 2d supply, fill #0

## 2023-05-01 MED ORDER — INSULIN PEN NEEDLE 32G X 4 MM MISC
0 refills | Status: DC
  Filled 2023-05-01: qty 100, 100d supply, fill #0

## 2023-05-01 NOTE — TOC Transition Note (Signed)
 Transition of Care [facility]) - Discharge Note   Patient Details  Name: [name] MRN: [id number] Date of Birth: Jan 22, 1975  Transition [facility]) CM/SW Contact:  Eusebio High, RN Phone Number: 05/01/2023, 11:54 AM   Clinical Narrative:     Patient will DC to home today  A new wheelchair will be delivered by [employer] (per Gladys Lamp). Patient referral to OP rehab on [address]. Family will transport home. No additional TOC needs        Barriers to Discharge: Continued Medical Work up   Patient Goals and CMS Choice Patient states their goals for this hospitalization and ongoing recovery are:: return home CMS Medicare.gov Compare Post Acute Care list provided to:: Patient Choice offered to / list presented to : Patient      Discharge Placement                       Discharge Plan and Services Additional resources added to the After Visit Summary for     Discharge Planning Services: CM Consult                                 Social Drivers of Health (SDOH) Interventions SDOH Screenings   Food Insecurity: No Food Insecurity (04/28/2023)  Housing: Low Risk  (04/28/2023)  Transportation Needs: No Transportation Needs (04/28/2023)  Utilities: Not At Risk (04/28/2023)  Alcohol Screen: Low Risk  (01/31/2023)  Depression (PHQ2-9): Low Risk  (04/26/2023)  Financial Resource Strain: Medium Risk (01/31/2023)  Physical Activity: Sufficiently Active (01/31/2023)  Social Connections: Socially Isolated (01/31/2023)  Stress: No Stress Concern Present (01/31/2023)  Tobacco Use: Low Risk  (04/26/2023)  Health Literacy: Adequate Health Literacy (01/31/2023)     Readmission Risk Interventions    03/14/2023    3:19 PM 10/03/2020    3:23 PM 08/23/2020    2:46 PM  Readmission Risk Prevention Plan  Transportation Screening Complete Complete Complete  PCP or Specialist Appt within 3-5 Days Complete Complete Complete  HRI or Home Care Consult Complete Complete Complete  Social Work  Consult for Recovery Care Planning/Counseling Complete Complete Complete  Palliative Care Screening Complete Not Applicable Not Applicable  Medication Review Oceanographer) Complete Complete Complete

## 2023-05-01 NOTE — Discharge Instructions (Addendum)
 Mr [name], [name] were hospitalized for right arm swelling and pain, that is most likely due to a small rotator cuff tear and overuse. At this time I feel comfortable discharging with PCP follow up and outpatient PT/OT. Thank you for allowing us  to be part of your care.   We arranged for you to follow up at: [facility] on 4/29 at 10:15 with Dr Jari Merles  I recommend decreasing your Degludec to 36 units daily. Please keep monitoring your blood sugar, and bring these values to your next PCP appointment.   *Please STOP taking:  Potassium supplement  Please make sure to return to the hospital if you have worsening pain or swelling, or develop a fever.   Please call our clinic if you have any questions or concerns, we may be able to help and keep you from a long and expensive emergency room wait. Our clinic and after hours phone number is [phone], the best time to call is Monday through Friday 9 am to 4 pm but there is always someone available 24/7 if you have an emergency. If you need medication refills please notify your pharmacy one week in advance and they will send us  a request.   Thanks,  Dr Esaw Heckler

## 2023-05-01 NOTE — Discharge Summary (Addendum)
 Name: [name] MRN: [id number] DOB: 18-May-1975 48 y.o. PCP: Victor Brooms, DO  Date of Admission: 04/26/2023 10:06 AM Date of Discharge: 05/01/23 Attending Physician: Dr. Lelia Kelley  Discharge Diagnosis: Principal Problem:   Inflammatory arthropathy Active Problems:   Insulin  dependent type 2 diabetes mellitus, controlled (HCC)   CKD (chronic kidney disease) stage 4, GFR 15-29 ml/min (HCC)   Unilateral AKA, right (HCC)   Right arm pain   Right hand pain    Discharge Medications: Allergies as of 05/01/2023       Reactions   Reglan  [metoclopramide ] Other (See Comments)   Dysphoric reaction   Chlorthalidone Hives   Metformin  And Related Diarrhea   Nsaids Other (See Comments)   Chronic kidney disease IV   Ozempic  (0.25 Or 0.5 Mg-dose) [semaglutide (0.25 Or 0.5mg -dos)] Nausea And Vomiting   Thiazide-type Diuretics Other (See Comments)   Gout        Medication List     STOP taking these medications    potassium chloride  SA 20 MEQ tablet Commonly known as: KLOR-CON  M       TAKE these medications    acetaminophen  325 MG tablet Commonly known as: TYLENOL  Take 2 tablets (650 mg total) by mouth every 6 (six) hours. What changed:  when to take this reasons to take this   allopurinol  100 MG tablet Commonly known as: ZYLOPRIM  Take 0.5 tablets (50 mg total) by mouth every other day.   amLODipine  10 MG tablet Commonly known as: NORVASC  Take 10 mg by mouth at bedtime.   carvedilol  25 MG tablet Commonly known as: COREG  TAKE 1 TABLET BY MOUTH TWICE DAILY WITH A MEAL   cloNIDine  0.1 mg/24hr patch Commonly known as: CATAPRES  - Dosed in mg/24 hr Place 0.1 mg onto the skin once a week. Thursdays   diclofenac  Sodium 1 % Gel Commonly known as: Voltaren  Apply 2 g topically 4 (four) times daily. What changed:  when to take this reasons to take this   Eliquis  5 MG Tabs tablet Generic drug: apixaban  Take 1 tablet by mouth twice daily   fluticasone  50 MCG/ACT  nasal spray Commonly known as: Flonase  Place 2 sprays into both nostrils daily. What changed:  when to take this reasons to take this   furosemide  80 MG tablet Commonly known as: LASIX  Take 1 tablet (80 mg total) by mouth 2 (two) times daily.   gabapentin  100 MG capsule Commonly known as: NEURONTIN  Take 100mg  in the morning, 100mg  in the afternoon, and 200mg  at night What changed:  how much to take how to take this when to take this additional instructions   hydrALAZINE  100 MG tablet Commonly known as: APRESOLINE  Take 100 mg by mouth 3 (three) times daily.   HYDROmorphone  2 MG tablet Commonly known as: DILAUDID  Take 1 tablet (2 mg total) by mouth every 6 (six) hours as needed for up to 3 days for severe pain (pain score 7-10).   lidocaine  4 % Commonly known as: HM Lidocaine  Patch Place 1 patch onto the skin daily.   lovastatin  20 MG tablet Commonly known as: MEVACOR  Take 1 tablet by mouth once daily   NovoLOG  FlexPen 100 UNIT/ML FlexPen Generic drug: insulin  aspart Inject 0-10 Units into the skin 3 (three) times daily with meals. Glucose 141-180: inject 4 units, Glucose 181-220: inject 6 units, Glucose 221-260: inject 8 units, Glucose 261-300: inject 10 units, If glucose if greater than 300, please inject 12 units and call your PCP and/or endocrinologist office   oxybutynin  5  MG tablet Commonly known as: DITROPAN  TAKE 1 TABLET BY MOUTH THREE TIMES DAILY   pantoprazole  20 MG tablet Commonly known as: PROTONIX  Take 1 tablet (20 mg total) by mouth daily.   tamsulosin  0.4 MG Caps capsule Commonly known as: FLOMAX  Take 1 capsule by mouth at bedtime   tirzepatide  15 MG/0.5ML Pen Commonly known as: MOUNJARO  Inject 15 mg into the skin once a week.   Tresiba  FlexTouch 200 UNIT/ML FlexTouch Pen Generic drug: insulin  degludec Inject 46 Units into the skin daily.        Disposition and follow-up:   [name] was discharged from [facility]  in Stable condition.  At the hospital follow up visit please address:  1.  Follow-up:  R arm swelling and pain: likely due to overuse injury/small rotator cuff tear. Evaluate for improvement in symptoms, ensure outpatient PT/OT is set up   T2DM: A1c has been at goal. He required much less than his home dose of insulin  while admitted. Discussed with patient decreasing his home Degludec to 35 units daily  2.  Labs / imaging needed at time of follow-up: None  3.  Pending labs/ test needing follow-up: None  4.  Medication Changes  Started:  Stopped: Kcl. Potassium had been at wnl throughout hospitalization  Changed:  Abx -   End Date:  Follow-up Appointments:  Follow-up Information     [facility]. Schedule an appointment as soon as possible for a visit.   Specialty: Rehabilitation Why: Referral sent for physical and occumpational therapy.  [facility] doesn't' accept your insurance. Contact information: [address] [place]   [phone]               Hospital Course by problem list:  R arm pain and swelling R shoulder tendinosis, possible supraspinatus tear  Patient  with hx of R AKA presented with RUE pain and swelling from his fingers to his shoulder. He had a similar hospitalization February, at which time a clinical diagnosis of gouty arthritis was made based on MRI findings and elevated uric acid levels. Upon arrival to the ED this time, arthrocentesis was performed which showed no crystals and moderate inflammation with WBC to 3k. These findings made the medicine team less concerned for gout or other crystalopathy. Out of concern for septic arthritis, antibiotic coverage was started and continued until synovial culture showed no growth for 48 hours. Autoimmune workup was unrevealing. MRI of the wrist ruled out osteomyelitis.   MRI of the shoulder showed tendinosis, possible supraspinatus tear, mild  subacromial/subdeltoid bursitis. Findings were overall likely indicative of overuse injury. Orthopedics service was consulted who recommended an arm sling and outpatient follow up. PT/OT recommended outpatient physical and occupational therapy. Patient's pain and swelling improved with rest and supportive treatment. On 4/22 patient was able to ambulate well in his wheelchair and was discharged with outpatient follow up.   T2DM: blood sugars remained at goal with qid SSI HFpEF: no signs of acute HF exacerbation. Home dose Lasix  was continued HTN: Continued home antihypertensives: amlodipine  10mg , Coreg  25mg  BID, Hydralazine  100mg  q8h CKD IV: Renal function at baseline Urinary retention: continued home tamsulosin  Constipation: miralax , senna prn   Discharge Subjective: Patient is feeling better today. He feels his swelling and pain have significantly improved. He would like to be discharged today and return to his class schedule  Discharge Exam:   BP (!) 146/77   Pulse 90   Temp 98 F (36.7 C)  Resp 18   Ht 6\' 2"  (1.88 m)   Wt (!) 149.7 kg   SpO2 100%   BMI 42.37 kg/m  Constitutional: well-appearing, in no acute distress Cardiovascular: regular rate and rhythm, no m/r/g Neurological: alert & oriented x 3 Ext: moderate swelling of elbow and wrist, improved from prior. Arm mobility somewhat limited by shoulder pain  Pertinent Labs, Studies, and Procedures:     Latest Ref Rng & Units 04/28/2023    4:04 AM 04/27/2023    5:20 AM 04/26/2023   10:39 AM  CBC  WBC 4.0 - 10.5 K/uL 9.2  9.4  11.2   Hemoglobin 13.0 - 17.0 g/dL 8.7  9.8  9.9   Hematocrit 39.0 - 52.0 % 27.6  30.6  33.0   Platelets 150 - 400 K/uL 195  222  204        Latest Ref Rng & Units 04/28/2023    4:04 AM 04/27/2023    5:20 AM 04/26/2023   10:39 AM  CMP  Glucose 70 - 99 mg/dL 85  77  49   BUN 6 - 20 mg/dL 61  61  57   Creatinine 0.61 - 1.24 mg/dL 4.00  8.67  6.19   Sodium 135 - 145 mmol/L 135  137  138   Potassium  3.5 - 5.1 mmol/L 4.0  4.3  4.7   Chloride 98 - 111 mmol/L 102  108  107   CO2 22 - 32 mmol/L 21  20  20    Calcium  8.9 - 10.3 mg/dL 8.5  9.4  9.1   Total Protein 6.5 - 8.1 g/dL   7.0   Total Bilirubin 0.0 - 1.2 mg/dL   0.7   Alkaline Phos 38 - 126 U/L   49   AST 15 - 41 U/L   11   ALT 0 - 44 U/L   9     VAS US  UPPER EXTREMITY VENOUS DUPLEX Result Date: 04/29/2023 UPPER VENOUS STUDY  Patient Name:  Yida Tawil  Date of Exam:   04/27/2023 Medical Rec #: [id number]      Accession #:    [id number] Date of Birth: 07-18-1975      Patient Gender: M Patient Age:   47 years Exam Location:  [facility] Procedure:      VAS US  UPPER EXTREMITY VENOUS DUPLEX Referring Phys: Gwenetta Lennert --------------------------------------------------------------------------------  Indications: Swelling, Pain, and severe inflammatory arthropathy involving the right shoulder, right elbow, and right wrist. Other Indications: Swollen right hand. Risk Factors: Obesity and Advanced CKD stage IV. Comparison Study: 02/22/23 - negative Performing Technologist: Franky Ivanoff Sturdivant-Jones RDMS, RVT  Examination Guidelines: A complete evaluation includes B-mode imaging, spectral Doppler, color Doppler, and power Doppler as needed of all accessible portions of each vessel. Bilateral testing is considered an integral part of a complete examination. Limited examinations for reoccurring indications may be performed as noted.  Right Findings: +----------+------------+---------+-----------+----------+-------+ RIGHT     CompressiblePhasicitySpontaneousPropertiesSummary +----------+------------+---------+-----------+----------+-------+ IJV           Full       Yes       Yes                      +----------+------------+---------+-----------+----------+-------+ Subclavian    Full       Yes       Yes                      +----------+------------+---------+-----------+----------+-------+ Axillary  Full       Yes       Yes                       +----------+------------+---------+-----------+----------+-------+ Brachial      Full                                          +----------+------------+---------+-----------+----------+-------+ Radial        Full                                          +----------+------------+---------+-----------+----------+-------+ Ulnar         Full                                          +----------+------------+---------+-----------+----------+-------+ Cephalic      Full                                          +----------+------------+---------+-----------+----------+-------+ Basilic       Full                                          +----------+------------+---------+-----------+----------+-------+  Left Findings: +----------+------------+---------+-----------+----------+-------+ LEFT      CompressiblePhasicitySpontaneousPropertiesSummary +----------+------------+---------+-----------+----------+-------+ Subclavian               Yes       Yes                      +----------+------------+---------+-----------+----------+-------+  Summary:  Right: No evidence of deep vein thrombosis in the upper extremity. No evidence of superficial vein thrombosis in the upper extremity.  Left: No evidence of thrombosis in the subclavian.  *See table(s) above for measurements and observations.  Diagnosing physician: Runell Countryman Electronically signed by Runell Countryman on 04/29/2023 at 3:38:06 PM.    Final    MR WRIST RIGHT W WO CONTRAST Result Date: 04/27/2023 CLINICAL DATA:  Osteomyelitis suspected in the wrist EXAM: MR OF THE RIGHT WRIST WITHOUT AND WITH CONTRAST TECHNIQUE: Multiplanar multisequence MR imaging of the right wrist was performed both before and after the administration of intravenous contrast. CONTRAST:  10mL GADAVIST  GADOBUTROL  1 MMOL/ML IV SOLN COMPARISON:  Radiographs 04/26/2023 FINDINGS: Ligaments: The intrinsic interosseous ligaments appear  grossly intact. Dorsal volar ligamentous structures appear grossly intact. Triangular fibrocartilage: Unremarkable Tendons: Substantial tenosynovitis of the extensor digitorum tendons in the fourth extensor compartment for example on image 11 series 13. Extensor digitorum tenosynovitis also noted proximal to the radiocarpal joint. Mild tenosynovitis involving the second extensor compartment and along the extensor carpi ulnaris The ECU appears subluxed out peripheral to the ulnar styloid raising suspicion for ECU subsheath tear. Mild distal flexor carpi ulnaris tendinopathy as on image 17 series 3. Carpal tunnel/median nerve: Abnormal edema in the carpal tunnel with some mild thickening of the ulnar nerve for example on image 18 series 13, carpal tunnel syndrome is a distinct possibility, correlate with clinical findings. Guyon's canal: No impinging lesion  identified. Joint/cartilage: Small radiocarpal joint effusion with moderate synovitis. Trace effusion of the distal radioulnar joint and middle carpal row. Small effusion the articulation between the triquetrum and pisiform. Bones/carpal alignment: No significant marrow edema or cortical destructive findings to suggest osteomyelitis in the wrist. Other: Diffuse subcutaneous edema along the wrist especially dorsally with low-grade enhancement suggesting dorsal cellulitis. No drainable subcutaneous abscess. No gas in the soft tissues identified. IMPRESSION: 1. Diffuse subcutaneous edema along the wrist especially dorsally with low-grade enhancement suggesting dorsal cellulitis. No drainable subcutaneous abscess. No gas in the soft tissues identified. 2. Substantial tenosynovitis of the extensor digitorum tendons in the fourth extensor compartment. Mild tenosynovitis involving the second extensor compartment and along the extensor carpi ulnaris. 3. The ECU appears subluxed out peripheral to the ulnar styloid raising suspicion for ECU subsheath tear. 4. Abnormal edema  in the carpal tunnel with some mild thickening of the ulnar nerve, carpal tunnel syndrome is a distinct possibility, correlate with clinical findings. 5. Small radiocarpal joint effusion with moderate synovitis. Trace effusion of the distal radioulnar joint and middle carpal row. Small effusion the articulation between the triquetrum and pisiform. 6. No findings of osteomyelitis. Electronically Signed   By: Freida Jes M.D.   On: 04/27/2023 08:25   MR HAND RIGHT W WO CONTRAST Result Date: 04/27/2023 CLINICAL DATA:  Swelling of the hand EXAM: MRI OF THE RIGHT HAND WITHOUT AND WITH CONTRAST TECHNIQUE: Multiplanar, multisequence MR imaging of the right hand was performed before and after the administration of intravenous contrast. Images are inclusive of the 4 fingers but not the thumb. CONTRAST:  10mL GADAVIST  GADOBUTROL  1 MMOL/ML IV SOLN COMPARISON:  Radiographs 04/26/2023 FINDINGS: Bones/Joint/Cartilage Imaging includes the 4 fingers but not the thumb. No significant bony destructive findings or marrow edema/enhancement identified to indicate osteomyelitis involving the index, middle, ring, or small finger. Metacarpophalangeal or interphalangeal joint effusion in these fingers identified. Ligaments No discrete collateral ligament injury in the 4 fingers is identified. No specific indicator of pulley abnormality. Muscles and Tendons Minimal/trace edema along the musculature of the hand including the flexor pollicis brevis, abductor pollicis brevis, opponens pollicis, adductor pollicis, abductor digiti minimi, and along the second dorsal interosseous musculature. This in general is less striking than the degree of edema in the subcutaneous tissues. No drainable abscess or gas within or along the musculature. Mild edema distally in the carpal tunnel along the flexor tendons. Soft tissues There is a substantial degree of subcutaneous edema tracking circumferentially but primarily dorsally in the hand and into  the fingers. No enhancing margins or drainable abscess. Cellulitis is not excluded. IMPRESSION: 1. Substantial degree of subcutaneous edema tracking circumferentially but primarily dorsally in the hand and into the fingers. Cellulitis is not excluded. No drainable abscess or gas within or along the musculature. 2. No osteomyelitis identified in the 4 fingers. No joint effusion. 3. Minimal/trace edema along the musculature of the hand, less striking than the degree of edema in the subcutaneous tissues. This is nonspecific and could be reactive or due to low-grade myositis. 4. Mild edema distally in the carpal tunnel along the flexor tendons. Electronically Signed   By: Freida Jes M.D.   On: 04/27/2023 08:17   DG ELBOW COMPLETE RIGHT (3+VIEW) Result Date: 04/27/2023 CLINICAL DATA:  Elbow pain. EXAM: RIGHT ELBOW - COMPLETE 3+ VIEW COMPARISON:  None Available. FINDINGS: No evidence for an acute fracture. No subluxation or dislocation. No worrisome lytic or sclerotic osseous abnormality. Lateral film demonstrates elevation of the anterior posterior  fat pads consistent with the presence of a joint effusion. IMPRESSION: 1. No acute bony findings. 2. Joint effusion. Electronically Signed   By: Donnal Fusi M.D.   On: 04/27/2023 07:59   DG Shoulder Right Result Date: 04/27/2023 CLINICAL DATA:  48 year old male with right upper extremity pain radiating to the hand. No known injury. EXAM: RIGHT SHOULDER - 2+ VIEW COMPARISON:  Prior chest radiographs, no previous right shoulder series. FINDINGS: Three views at 0505 hours. No glenohumeral joint dislocation. Bone mineralization is within normal limits for age. Right clavicle and scapula appear intact, aligned. Mild to moderate degenerative spurring at both the glenoid and the acromioclavicular joint. No acute osseous abnormality identified. Visible right ribs and chest appear negative. IMPRESSION: Mild-to-moderate right glenohumeral and AC joint degeneration. No  acute osseous abnormality identified. Electronically Signed   By: Marlise Simpers M.D.   On: 04/27/2023 05:30   DG Hand Complete Right Result Date: 04/26/2023 CLINICAL DATA:  Right hand swelling. Pain radiating to right shoulder for 2 days. No known injury. Evaluate for osteomyelitis versus fracture of right hand. Swelling and pain. EXAM: RIGHT WRIST - COMPLETE 3+ VIEW; RIGHT HAND - COMPLETE 3+ VIEW COMPARISON:  Right wrist radiographs 02/06/2023 and 02/22/2023, MRI right wrist 02/23/2023 FINDINGS: Right wrist: There is 2 mm ulnar negative variance. Joint spaces are preserved. No acute fracture or dislocation. Moderate atherosclerotic calcifications. Right hand: Unchanged chronic widening and mild irregularity of the dorsal cortex of the proximal 50% of the proximal phalanx of the thumb, likely a remote healed fracture. Joint spaces are preserved. No acute fracture is seen. No dislocation. No cortical erosion is seen Moderate atherosclerotic calcifications. IMPRESSION: 1. No acute fracture or dislocation of the right wrist or hand. 2. Unchanged chronic widening and mild irregularity of the dorsal cortex of the proximal 50% of the proximal phalanx of the thumb, likely a remote healed fracture. 3. No definite cortical erosion is seen to indicate radiographic evidence of osteomyelitis. Electronically Signed   By: Bertina Broccoli M.D.   On: 04/26/2023 19:26   DG Wrist Complete Right Result Date: 04/26/2023 CLINICAL DATA:  Right hand swelling. Pain radiating to right shoulder for 2 days. No known injury. Evaluate for osteomyelitis versus fracture of right hand. Swelling and pain. EXAM: RIGHT WRIST - COMPLETE 3+ VIEW; RIGHT HAND - COMPLETE 3+ VIEW COMPARISON:  Right wrist radiographs 02/06/2023 and 02/22/2023, MRI right wrist 02/23/2023 FINDINGS: Right wrist: There is 2 mm ulnar negative variance. Joint spaces are preserved. No acute fracture or dislocation. Moderate atherosclerotic calcifications. Right hand: Unchanged  chronic widening and mild irregularity of the dorsal cortex of the proximal 50% of the proximal phalanx of the thumb, likely a remote healed fracture. Joint spaces are preserved. No acute fracture is seen. No dislocation. No cortical erosion is seen Moderate atherosclerotic calcifications. IMPRESSION: 1. No acute fracture or dislocation of the right wrist or hand. 2. Unchanged chronic widening and mild irregularity of the dorsal cortex of the proximal 50% of the proximal phalanx of the thumb, likely a remote healed fracture. 3. No definite cortical erosion is seen to indicate radiographic evidence of osteomyelitis. Electronically Signed   By: Bertina Broccoli M.D.   On: 04/26/2023 19:26    Signed: Jayson Michael, MD PGY-1 05/01/2023, 11:24 AM   Pager: [phone]

## 2023-05-01 NOTE — Care Management Important Message (Signed)
 Important Message  Patient Details  Name: [name] MRN: [id number] Date of Birth: 1975-02-26   Important Message Given:  Yes - Medicare IM     Wynonia Hedges 05/01/2023, 3:03 PM

## 2023-05-01 NOTE — Progress Notes (Signed)
 Physical Therapy Treatment Patient Details Name: [name] MRN: [id number] DOB: [birth year] Today's Date: 05/01/2023   History of Present Illness Pt is a 48 y/o M admitted on 04/26/23 after presenting with c/o RUE pain. MRI with supraspinatus tendon tear. PMH: DM2, HFpEF, CKD4, PE on Eliquis , gout, R AKA 2/2 osteomyelitis    PT Comments  Pt seen for PT tx with pt agreeable. Pt is able to complete bed>w/c via squat pivot with mod I & propel w/c around unit with mod I, demonstrating good awareness & ability to manage w/c brakes. Pt asking about kinesiotape for shoulder & PT encouraged him to f/u with OPPT. Pt is making good progress with mobility.    If plan is discharge home, recommend the following: Assistance with cooking/housework;Assist for transportation   Can travel by private vehicle        Equipment Recommendations  None recommended by PT    Recommendations for Other Services       Precautions / Restrictions Precautions Precautions: Fall Precaution/Restrictions Comments: old R AKA Required Braces or Orthoses: Sling;Other Brace Other Brace: RUE sling and prefab splint for comfort only Restrictions Weight Bearing Restrictions Per Provider Order: No     Mobility  Bed Mobility               General bed mobility comments: not tested, pt received sitting EOB, left sitting in w/c    Transfers Overall transfer level: Modified independent Equipment used: None Transfers: Bed to chair/wheelchair/BSC       Squat pivot transfers: Modified independent (Device/Increase time) (bed>w/c on R)          Ambulation/Gait                   Dance movement psychotherapist Wheelchair mobility: Yes Wheelchair propulsion: Both upper extremities, Left lower extremity Wheelchair parts: Independent Distance: >300 ft Wheelchair Assistance Details (indicate cue type and reason): pt intermittently pulling on rail in hallway to assist  with w/c propulsion but not necessary; pt able propel up/down ramp on 5N hallway without assistance.   Tilt Bed    Modified Rankin (Stroke Patients Only)       Balance Overall balance assessment: Needs assistance Sitting-balance support: No upper extremity supported, Feet supported Sitting balance-[name]: Normal                                      Communication Communication Communication: No apparent difficulties  Cognition Arousal: Alert Behavior During Therapy: WFL for tasks assessed/performed   PT - Cognitive impairments: No apparent impairments                         Following commands: Intact      Cueing Cueing Techniques: Verbal cues  Exercises      General Comments General comments (skin integrity, edema, etc.): Pt electing to don/doff R wrist splint for comfort      Pertinent Vitals/Pain Pain Assessment Pain Assessment: Faces Faces Pain Scale: Hurts little more Pain Location: R shoulder & wrist with end range AROM Pain Descriptors / Indicators: Discomfort, Grimacing, Guarding Pain Intervention(s): Monitored during session (utilized R wrist splint PRN)    Home Living  Prior Function            PT Goals (current goals can now be found in the care plan section) Acute Rehab PT Goals Patient Stated Goal: decreased pain, continue to get better, go to OPPT PT Goal Formulation: With patient Time For Goal Achievement: 05/11/23 Potential to Achieve Goals: Good Additional Goals Additional Goal #1: Pt will propel w/c x 150 ft with mod I to increase independence with mobility. Progress towards PT goals: Progressing toward goals    Frequency    Min 2X/week      PT Plan      Co-evaluation              AM-PAC PT "6 Clicks" Mobility   Outcome Measure  Help needed turning from your back to your side while in a flat bed without using bedrails?: None Help needed moving from lying  on your back to sitting on the side of a flat bed without using bedrails?: None Help needed moving to and from a bed to a chair (including a wheelchair)?: None Help needed standing up from a chair using your arms (e.g., wheelchair or bedside chair)?: A Lot Help needed to walk in hospital room?: Total Help needed climbing 3-5 steps with a railing? : Total 6 Click Score: 16    End of Session   Activity Tolerance: Patient tolerated treatment well Patient left:  (in w/c in room)   PT Visit Diagnosis: Other abnormalities of gait and mobility (R26.89);Difficulty in walking, not elsewhere classified (R26.2);Muscle weakness (generalized) (M62.81);Pain Pain - Right/Left: Right Pain - part of body: Arm     Time: 9147-8295 PT Time Calculation (min) (ACUTE ONLY): 29 min  Charges:    $Therapeutic Activity: 23-37 mins PT General Charges $$ ACUTE PT VISIT: 1 Visit                     [name], PT, DPT 05/01/23, 10:28 AM    [name] 05/01/2023, 10:27 AM

## 2023-05-01 NOTE — Progress Notes (Signed)
 Orthopedic Tech Progress Note Patient Details:  [name] [birth year] [id number]  Ortho Devices Type of Ortho Device: Velcro wrist splint Ortho Device/Splint Location: rue Ortho Device/Splint Interventions: Ordered, Application, Adjustment   Post Interventions Patient Tolerated: Well Instructions Provided: Care of device, Adjustment of device  [name] 05/01/2023, 6:19 AM

## 2023-05-01 NOTE — Progress Notes (Signed)
 Occupational Therapy Treatment Patient Details Name: [name] MRN: [id number] DOB: 06-06-1975 Today's Date: 05/01/2023   History of present illness Pt is a 48 y/o M admitted on 04/26/23 after presenting with c/o RUE pain. MRI with supraspinatus tendon tear. PMH: DM2, HFpEF, CKD4, PE on Eliquis , gout, R AKA 2/2 osteomyelitis   OT comments  Pt up in w/c upon arrival at doorway, noted to have R wrist splint donned. Educated pt on brace wear for comfort only, monitoring skin for breakdown and pt verbalized understanding, demo's ability to don/doff brace without assist. Notified by PT pt was requesting to shower, provided pt with tub bench and pt able to position in  shower independently. Pt wanting to wait a bit before showering, told pt to notify RN when he is finished and pt verbalized understanding. Pt presenting with impairments listed below, will follow acutely. Continue to recommend OP OT at d/c.       If plan is discharge home, recommend the following:  A little help with walking and/or transfers;A little help with bathing/dressing/bathroom;Assistance with cooking/housework;Direct supervision/assist for medications management;Direct supervision/assist for financial management;Assist for transportation;Help with stairs or ramp for entrance   Equipment Recommendations  None recommended by OT    Recommendations for Other Services PT consult    Precautions / Restrictions Precautions Precautions: Fall Precaution/Restrictions Comments: old R AKA Required Braces or Orthoses: Sling;Other Brace Other Brace: RUE sling and prefab splint for comfort only Restrictions Weight Bearing Restrictions Per Provider Order: No       Mobility Bed Mobility               General bed mobility comments: up in w/c upon arrival    Transfers                   General transfer comment: propelling w/c independently     Balance Overall balance assessment: Needs  assistance Sitting-balance support: No upper extremity supported, Feet supported Sitting balance-[name]: Normal                                     ADL either performed or assessed with clinical judgement   ADL Overall ADL's : Needs assistance/impaired                                       General ADL Comments: pt seen for education on splint wear and provided pt with shower chair for bathing (approved by MD), however pt not wanting to shower at time of session, wanting to wait a bit    Extremity/Trunk Assessment Upper Extremity Assessment Upper Extremity Assessment: Right hand dominant RUE Deficits / Details: shoulder AROM WFL, limited wrist/elbow movement from ace wrap but WFL RUE: Unable to fully assess due to pain   Lower Extremity Assessment Lower Extremity Assessment: Defer to PT evaluation        Vision   Vision Assessment?: No apparent visual deficits   Perception Perception Perception: Not tested   Praxis Praxis Praxis: Not tested   Communication Communication Communication: No apparent difficulties   Cognition Arousal: Alert Behavior During Therapy: WFL for tasks assessed/performed Cognition: No apparent impairments                               Following commands: Intact  Cueing   Cueing Techniques: Verbal cues  Exercises      Shoulder Instructions       General Comments VSS    Pertinent Vitals/ Pain       Pain Assessment Pain Assessment: Faces Pain Score: 2  Faces Pain Scale: Hurts a little bit Pain Location: R shoulder & wrist with end range AROM Pain Descriptors / Indicators: Discomfort, Grimacing, Guarding Pain Intervention(s): Limited activity within patient's tolerance, Monitored during session, Repositioned  Home Living                                          Prior Functioning/Environment              Frequency  Min 2X/week        Progress  Toward Goals  OT Goals(current goals can now be found in the care plan section)  Progress towards OT goals: Progressing toward goals  Acute Rehab OT Goals Patient Stated Goal: to shower OT Goal Formulation: With patient Time For Goal Achievement: 05/12/23 Potential to Achieve Goals: Good ADL Goals Pt Will Perform Upper Body Dressing: with supervision;sitting Pt Will Perform Lower Body Dressing: with supervision;sitting/lateral leans;sit to/from stand Pt Will Transfer to Toilet: with supervision;stand pivot transfer;with transfer board;bedside commode Pt/caregiver will Perform Home Exercise Program: Increased ROM;Increased strength;Right Upper extremity;With minimal assist;With written HEP provided  Plan      Co-evaluation                 AM-PAC OT "6 Clicks" Daily Activity     Outcome Measure   Help from another person eating meals?: None Help from another person taking care of personal grooming?: A Little Help from another person toileting, which includes using toliet, bedpan, or urinal?: A Little Help from another person bathing (including washing, rinsing, drying)?: A Little Help from another person to put on and taking off regular upper body clothing?: A Little Help from another person to put on and taking off regular lower body clothing?: A Little 6 Click Score: 19    End of Session    OT Visit Diagnosis: Unsteadiness on feet (R26.81);Other abnormalities of gait and mobility (R26.89);Muscle weakness (generalized) (M62.81)   Activity Tolerance Patient tolerated treatment well   Patient Left Other (comment) (in w/c with plans to get in shower soon, RN notified)   Nurse Communication Mobility status; Pt ok to shower, plans to shower soon        Time: 1113-1130 OT Time Calculation (min): 17 min  Charges: OT General Charges $OT Visit: 1 Visit OT Treatments $Therapeutic Activity: 8-22 mins  Kohl Polinsky K, OTD, OTR/L SecureChat Preferred Acute Rehab [phone]   Antionette Kirks 05/01/2023, 11:48 AM

## 2023-05-01 NOTE — Care Management Important Message (Signed)
 Important Message  Patient Details  Name: [name] MRN: [id number] Date of Birth: [birth year]   Important Message Given:  Yes - Medicare IM     Wynonia Hedges 05/01/2023, 3:15 PM

## 2023-05-02 ENCOUNTER — Telehealth: Payer: Self-pay

## 2023-05-02 NOTE — Patient Instructions (Signed)
 Visit Information  Thank you for taking time to visit with me today. Please don't hesitate to contact me if I can be of assistance to you before our next scheduled telephone appointment.  Our next appointment is by telephone on 05/08/23 at 12:30pm  Following is a copy of your care plan:   Goals Addressed               This Visit's Progress     COMPLETED: TOC Care Plan - Patient will report no readmissions in the next 30 days (pt-stated)        Current Barriers:  Knowledge deficit related to changes with medications 04/10/23 TOC RN completed Medication reconciliation with patient who denied any medication issues/concerns  RNCM Clinical Goal(s):  Patient will work with the Care Management team over the next 30 days to address Transition of Care Barriers: Medication Management 04/10/23 Ongoing weekly calls Patient will take all medications exactly as prescribed and will call provider for medication related questions as evidenced by patient report and health record 04/10/23 Ongoing weekly calls Patient will attend all scheduled medical appointments:04/10/23 Ongoing weekly calls-  plan to see Ortho Dr Julio Ohm 04/16/23 to discuss skin removal for better fitting prosthesis Patient will contact endocrinology office to move up scheduled appointment related to change with diabetic medications - 04/10/23 Patient reports Tresiba  45 units and still has endo appointment 05/15/23  Interventions: Evaluation of current treatment plan related to  self management and patient's adherence to plan as established by provider - 04/10/23 Ongoing weekly calls  Transitions of Care:  Goal on track:  Yes. Contacted provider for patient needs - Patient reporting non productive cough that is keeping him up at night - 03/27/23 patient reporting cough resolved with increase of lasix  - 04/03/23 issue resolved Assessed if patient has "shrinker" for AKA to compress for prosthetic fitting - Patient confirmed he has the shrinker - 04/10/23 remains  difficult to use related to swelling (patient reports all swelling has resolved) - patient reports plan to see Dr Julio Ohm 04/16/23 to discuss options including skin removal  Heart Failure Interventions:  (Status:  Goal on track:  Yes.) Short Term Goal -04/10/23 Ongoing weekly calls Basic overview and discussion of pathophysiology of Heart Failure reviewed Provided education on low sodium diet - reviewed Discussed the importance of keeping all appointments with provider - 04/10/23 patient is keeping his MD appointments  Assessed social determinant of health barriers - 03/27/23 Patient reports he gets over $200/month food stamps and has card from insurance provider that helps with food 04/10/23 no new issues reported  Diabetes Interventions:  (Status:  Goal on track:  Yes.) Short Term Goal Assessed patient's understanding of A1c goal: <6.5% Provided education to patient about basic DM disease process Reviewed medications with patient and discussed importance of medication adherence Reviewed scheduled/upcoming provider appointments including: Educated patient on importance of moving up his endocrinology appointment from May related to his recent medication changes Lab Results  Component Value Date   HGBA1C 5.2 12/06/2022  *03/26/23 A1C now 4.9   Patient Goals/Self-Care Activities: Participate in Transition of Care Program/Attend [facility] scheduled calls Notify RN Care Manager of [facility] call rescheduling needs Take all medications as prescribed Attend all scheduled provider appointments (PCP 03/21/23)and call endocrinology to move up next appointment from May - Patient has appointment with Dr Julio Ohm 4/7 and has not called endocrinology to move appointment up from 05/15/23 but states A1C is now 4.9 and he is taking Tresiba  as PCP instructed call office if  I gain more than 2 pounds in one day or 5 pounds in one week keep legs up while sitting use salt in moderation watch for swelling in feet, ankles and legs every  day know when to call the doctor:Gain more than 3 pounds in one day, more than  5 pounds in one week, or shortness of breath or worsening lower leg edema If your fasting blood glucose in the morning remains elevated (greater than 300mg  every morning) please give the clinic a call and your endocrinologist office for advice on changing your insulin  regimen. If your fasting blood glucose level or any blood glucose reading throughout the day is below 70, please call the either our clinic or the endocrinologist office NovoLOG  FlexPen 100 UNIT/ML FlexPen - Follow Sliding Scale: Inject 0-10 Units into the skin 3 (three) times daily with meals. Glucose 141-180: inject 4 units, Glucose 181-220: inject 6 units, Glucose 221-260: inject 8 units, Glucose 261-300: inject 10 units, If glucose if greater than 300, please inject 12 units and call your PCP and/or endocrinologist office  Follow Up Plan:  Telephone follow up appointment with care management team member scheduled for:  04/17/23 3pm The patient has been provided with contact information for the care management team and has been advised to call with any health related questions or concerns.        VBCI Transitions of Care (TOC) Care Plan        Problems:  Recent Hospitalization for treatment of Inflammatory arthropathy No Specialist appointment Patient states he will call and schedule with ortho  and  [facility]  Goal:  Over the next 30 days, the patient will not experience hospital readmission  Interventions:  Transitions of Care: Doctor Visits  - discussed the importance of doctor visits Reinforced importance of calling to schedule  Reviewed medications quickly as patient requested to end the call  Diabetes Interventions: Assessed patient's understanding of A1c goal: <6.5% Patient requested to end call -will review in future call Lab Results  Component Value Date   HGBA1C 4.9 03/26/2023    Patient Self Care  Activities:  Attend all scheduled provider appointments Call pharmacy for medication refills 3-7 days in advance of running out of medications Call provider office for new concerns or questions  Notify RN Care Manager of [facility] call rescheduling needs Participate in Transition of Care Program/Attend TOC scheduled calls Take medications as prescribed   check feet daily for cuts, sores or redness take the blood sugar log to all doctor visits  Plan:  Next PCP appointment scheduled for: 05/08/23 10:15am Telephone follow up appointment with care management team member scheduled for:  05/08/23 12:30pm The patient has been provided with contact information for the care management team and has been advised to call with any health related questions or concerns.         Patient verbalizes understanding of instructions and care plan provided today and agrees to view in MyChart. Active MyChart status and patient understanding of how to access instructions and care plan via MyChart confirmed with patient.     Telephone follow up appointment with care management team member scheduled for:05/08/23 12:30pm The patient has been provided with contact information for the care management team and has been advised to call with any health related questions or concerns.   Please call the care guide team at [phone] if you need to cancel or reschedule your appointment.   Please call the Suicide and Crisis Lifeline: 988 call the [place]   National Suicide Prevention Lifeline: 781-038-5538 or TTY: 678-404-4441 TTY 559-448-8290) to talk to a trained counselor call 911 if you are experiencing a Mental Health or Behavioral Health Crisis or need someone to talk to.  Tonia Frankel RN, CCM [place]  VBCI-Population Health RN Care Manager [phone]

## 2023-05-02 NOTE — Transitions of Care (Post Inpatient/ED Visit) (Signed)
   05/02/2023  Name: [name] MRN: [id number] DOB: Jun 02, 1975  Today's TOC FU Call Status: Today's TOC FU Call Status:: Unsuccessful Call (1st Attempt) Unsuccessful Call (1st Attempt) Date: 05/02/23  Attempted to reach the patient regarding the most recent Inpatient/ED visit.Patient requested call back today at 3:40pm  Follow Up Plan: Additional outreach attempts will be made to reach the patient to complete the Transitions of Care (Post Inpatient/ED visit) call.   Tonia Frankel RN, CCM [place]  VBCI-Population Health RN Care Manager [phone]

## 2023-05-02 NOTE — Transitions of Care (Post Inpatient/ED Visit) (Signed)
 05/02/2023  Name: [name] MRN: [id number] DOB: 1975/05/21  Today's TOC FU Call Status: Today's TOC FU Call Status:: Successful TOC FU Call Completed TOC FU Call Complete Date: 05/02/23 Patient's Name and Date of Birth confirmed.  Transition Care Management Follow-up Telephone Call Date of Discharge: 05/01/23 Discharge Facility: [facility]) Type of Discharge: Inpatient Admission How have you been since you were released from the hospital?: Better Any questions or concerns?: No (patient denies questions)  Items Reviewed: Did you receive and understand the discharge instructions provided?: Yes Medications obtained,verified, and reconciled?: Yes (Medications Reviewed) Any new allergies since your discharge?: No Dietary orders reviewed?: Yes Type of Diet Ordered:: low sodium heart healthy  - carb modified Do you have support at home?: Yes People in Home [RPT]: alone Name of Support/Comfort Primary Source: siblings and friends  Medications Reviewed Today: Medications Reviewed Today     Reviewed by Sharmaine Dearth, RN (Registered Nurse) on 05/02/23 at 1610  Med List Status: <None>   Medication Order Taking? Sig Documenting Provider Last Dose Status Informant  acetaminophen  (TYLENOL ) 325 MG tablet [id number]  Take 2 tablets (650 mg total) by mouth every 6 (six) hours.  Patient taking differently: Take 650 mg by mouth every 6 (six) hours as needed for mild pain (pain score 1-3).   Aurora Lees, DO  Active Self  allopurinol  (ZYLOPRIM ) 100 MG tablet [id number] Yes Take 0.5 tablets (50 mg total) by mouth every other day. Sheree Dieter, MD Taking Active Self  amLODipine  (NORVASC ) 10 MG tablet [id number] Yes Take 10 mg by mouth at bedtime. [provider] Taking Active Self  carvedilol  (COREG ) 25 MG tablet [id number] Yes TAKE 1 TABLET BY MOUTH TWICE DAILY WITH A MEAL Amoako, Prince, MD Taking Active Self  cloNIDine  (CATAPRES  - DOSED IN MG/24 HR) 0.1 mg/24hr patch  [id number] Yes Place 0.1 mg onto the skin once a week. Thursdays [provider] Taking Active Self           Med Note Baltazar Leventhal, [place]   Fri Apr 27, 2023  3:14 PM)    diclofenac  Sodium (VOLTAREN ) 1 % GEL [id number] Yes Apply 2 g topically 4 (four) times daily.  Patient taking differently: Apply 2 g topically daily as needed (for pain).   Freada Jacobs, MD Taking Active Self  ELIQUIS  5 MG TABS tablet [id number] Yes Take 1 tablet by mouth twice daily Priscella Brooms, DO Taking Active Self           Med Note (SATTERFIELD, DARIUS E   Sun Apr 29, 2023  8:47 PM) @1800   fluticasone  (FLONASE ) 50 MCG/ACT nasal spray [id number] Yes Place 2 sprays into both nostrils daily.  Patient taking differently: Place 2 sprays into both nostrils daily as needed for allergies.   Freada Jacobs, MD Taking Active Self  furosemide  (LASIX ) 80 MG tablet [id number] Yes Take 1 tablet (80 mg total) by mouth 2 (two) times daily.  Patient taking differently: Take 80 mg by mouth 2 (two) times daily. Patient states 80mg  2x/day dose has resumed   Aurora Lees, DO Taking Active Self           Med Note Bernadene Brewer, Dusty Wagoner A   Tue Mar 27, 2023  9:40 AM) Patient reports 03/22/23 nephrology instructed patient to take 80mg  M,W,F 3x/day; and all other days 80mg  2x/day  gabapentin  (NEURONTIN ) 100 MG capsule [id number] Yes Take 100mg  in the morning, 100mg  in the afternoon, and 200mg  at night  Patient taking differently: Take 100-200 mg by mouth  3 (three) times daily. Take 100mg  by mouth in the morning, then take 100 mg by mouth in the afternoon, and 200 mg by mouth at night per patient   Sheree Dieter, MD Taking Active Self           Med Note Baltazar Leventhal, [place]   Fri Apr 27, 2023  3:15 PM)    hydrALAZINE  (APRESOLINE ) 100 MG tablet [id number] Yes Take 100 mg by mouth 3 (three) times daily. [provider] Taking Active Self  HYDROmorphone  (DILAUDID ) 2 MG tablet [id number] Yes Take 1 tablet (2 mg total) by mouth every  6 (six) hours as needed for up to 3 days for severe pain (pain score 7-10). Jayson Michael, MD Taking Active   insulin  degludec (TRESIBA  FLEXTOUCH) 200 UNIT/ML FlexTouch Pen [id number] Yes Inject 36 Units into the skin daily.  Patient taking differently: Inject 35 Units into the skin daily.   Jayson Michael, MD Taking Active   Insulin  Pen Needle 32G X 4 MM MISC [id number]  Use as directed   Active   lidocaine  (HM LIDOCAINE  PATCH) 4 % [id number] No Place 1 patch onto the skin daily.  Patient not taking: Reported on 05/02/2023   True Fuss, MD Not Taking Active Self  lovastatin  (MEVACOR ) 20 MG tablet [id number] Yes Take 1 tablet by mouth once daily Amoako, Prince, MD Taking Active Self  NOVOLOG  FLEXPEN 100 UNIT/ML FlexPen [id number] Yes Inject 0-10 Units into the skin 3 (three) times daily with meals. Glucose 141-180: inject 4 units, Glucose 181-220: inject 6 units, Glucose 221-260: inject 8 units, Glucose 261-300: inject 10 units, If glucose if greater than 300, please inject 12 units and call your PCP and/or endocrinologist office Aurora Lees, DO Taking Active Self  oxybutynin  (DITROPAN ) 5 MG tablet [id number] Yes TAKE 1 TABLET BY MOUTH THREE TIMES DAILY Priscella Brooms, DO Taking Active Self  pantoprazole  (PROTONIX ) 20 MG tablet [id number] No Take 1 tablet (20 mg total) by mouth daily.  Patient not taking: Reported on 04/10/2023   Aurora Lees, DO Not Taking Active Self           Med Note [facility], Nicasio Barlowe A   Mon Mar 19, 2023 10:00 AM) Patient states he does not have this medication and states he does not have reflux  tamsulosin  (FLOMAX ) 0.4 MG CAPS capsule [id number] Yes Take 1 capsule by mouth at bedtime Amoako, Prince, MD Taking Active Self  tirzepatide  (MOUNJARO ) 15 MG/0.5ML Pen [id number] Yes Inject 15 mg into the skin once a week. Thapa, [place], MD Taking Active Self           Med Note (SATTERFIELD, Ken Patty   Wed Mar 14, 2023  3:12 PM) Take on Wednesdays             Home Care and  Equipment/Supplies: Were Home Health Services Ordered?: No Any new equipment or medical supplies ordered?: Yes Name of Medical supply agency?: [employer] Were you able to get the equipment/medical supplies?: Yes Do you have any questions related to the use of the equipment/supplies?: No  Functional Questionnaire: Do you need assistance with bathing/showering or dressing?: No Do you need assistance with meal preparation?: No Do you need assistance with eating?: No Do you have difficulty maintaining continence: No Do you need assistance with getting out of bed/getting out of a chair/moving?: No Do you have difficulty managing or taking your medications?: No  Follow up appointments reviewed: PCP Follow-up appointment confirmed?: Yes Date of PCP follow-up appointment?: 05/08/23 Follow-up Provider: Jearldine Mina, resident  at [facility] Follow-up appointment confirmed?: NA Do you need transportation to your follow-up appointment?: No (Patient uses [facility] [place]) Do you understand care options if your condition(s) worsen?: Yes-patient verbalized understanding  SDOH Interventions Today    Flowsheet Row Most Recent Value  SDOH Interventions   Food Insecurity Interventions Intervention Not Indicated  Housing Interventions Intervention Not Indicated  Transportation Interventions Intervention Not Indicated  Utilities Interventions Intervention Not Indicated       Goals Addressed               This Visit's Progress     COMPLETED: TOC Care Plan - Patient will report no readmissions in the next 30 days (pt-stated)        Current Barriers:  Knowledge deficit related to changes with medications 04/10/23 TOC RN completed Medication reconciliation with patient who denied any medication issues/concerns  RNCM Clinical Goal(s):  Patient will work with the Care Management team over the next 30 days to address Transition of Care Barriers: Medication Management 04/10/23 Ongoing weekly  calls Patient will take all medications exactly as prescribed and will call provider for medication related questions as evidenced by patient report and health record 04/10/23 Ongoing weekly calls Patient will attend all scheduled medical appointments:04/10/23 Ongoing weekly calls-  plan to see Ortho Dr Julio Ohm 04/16/23 to discuss skin removal for better fitting prosthesis Patient will contact endocrinology office to move up scheduled appointment related to change with diabetic medications - 04/10/23 Patient reports Tresiba  45 units and still has endo appointment 05/15/23  Interventions: Evaluation of current treatment plan related to  self management and patient's adherence to plan as established by provider - 04/10/23 Ongoing weekly calls  Transitions of Care:  Goal on track:  Yes. Contacted provider for patient needs - Patient reporting non productive cough that is keeping him up at night - 03/27/23 patient reporting cough resolved with increase of lasix  - 04/03/23 issue resolved Assessed if patient has "shrinker" for AKA to compress for prosthetic fitting - Patient confirmed he has the shrinker - 04/10/23 remains difficult to use related to swelling (patient reports all swelling has resolved) - patient reports plan to see Dr Julio Ohm 04/16/23 to discuss options including skin removal  Heart Failure Interventions:  (Status:  Goal on track:  Yes.) Short Term Goal -04/10/23 Ongoing weekly calls Basic overview and discussion of pathophysiology of Heart Failure reviewed Provided education on low sodium diet - reviewed Discussed the importance of keeping all appointments with provider - 04/10/23 patient is keeping his MD appointments  Assessed social determinant of health barriers - 03/27/23 Patient reports he gets over $200/month food stamps and has card from insurance provider that helps with food 04/10/23 no new issues reported  Diabetes Interventions:  (Status:  Goal on track:  Yes.) Short Term Goal Assessed patient's  understanding of A1c goal: <6.5% Provided education to patient about basic DM disease process Reviewed medications with patient and discussed importance of medication adherence Reviewed scheduled/upcoming provider appointments including: Educated patient on importance of moving up his endocrinology appointment from May related to his recent medication changes Lab Results  Component Value Date   HGBA1C 5.2 12/06/2022  *03/26/23 A1C now 4.9   Patient Goals/Self-Care Activities: Participate in Transition of Care Program/Attend [facility] scheduled calls Notify RN Care Manager of [facility] call rescheduling needs Take all medications as prescribed Attend all scheduled provider appointments (PCP 03/21/23)and call endocrinology to move up next appointment from May - Patient has appointment with Dr Julio Ohm 4/7 and has  not called endocrinology to move appointment up from 05/15/23 but states A1C is now 4.9 and he is taking Tresiba  as PCP instructed call office if I gain more than 2 pounds in one day or 5 pounds in one week keep legs up while sitting use salt in moderation watch for swelling in feet, ankles and legs every day know when to call the doctor:Gain more than 3 pounds in one day, more than  5 pounds in one week, or shortness of breath or worsening lower leg edema If your fasting blood glucose in the morning remains elevated (greater than 300mg  every morning) please give the clinic a call and your endocrinologist office for advice on changing your insulin  regimen. If your fasting blood glucose level or any blood glucose reading throughout the day is below 70, please call the either our clinic or the endocrinologist office NovoLOG  FlexPen 100 UNIT/ML FlexPen - Follow Sliding Scale: Inject 0-10 Units into the skin 3 (three) times daily with meals. Glucose 141-180: inject 4 units, Glucose 181-220: inject 6 units, Glucose 221-260: inject 8 units, Glucose 261-300: inject 10 units, If glucose if greater than 300, please  inject 12 units and call your PCP and/or endocrinologist office  Follow Up Plan:  Telephone follow up appointment with care management team member scheduled for:  04/17/23 3pm The patient has been provided with contact information for the care management team and has been advised to call with any health related questions or concerns.        VBCI Transitions of Care (TOC) Care Plan        Problems:  Recent Hospitalization for treatment of Inflammatory arthropathy No Specialist appointment Patient states he will call and schedule with ortho  and  [facility]  Goal:  Over the next 30 days, the patient will not experience hospital readmission  Interventions:  Transitions of Care: Doctor Visits  - discussed the importance of doctor visits Reinforced importance of calling to schedule  Reviewed medications quickly as patient requested to end the call  Diabetes Interventions: Assessed patient's understanding of A1c goal: <6.5% Patient requested to end call -will review in future call Lab Results  Component Value Date   HGBA1C 4.9 03/26/2023    Patient Self Care Activities:  Attend all scheduled provider appointments Call pharmacy for medication refills 3-7 days in advance of running out of medications Call provider office for new concerns or questions  Notify RN Care Manager of [facility] call rescheduling needs Participate in Transition of Care Program/Attend TOC scheduled calls Take medications as prescribed   check feet daily for cuts, sores or redness take the blood sugar log to all doctor visits  Plan:  Next PCP appointment scheduled for: 05/08/23 10:15am Telephone follow up appointment with care management team member scheduled for:  05/08/23 12:30pm The patient has been provided with contact information for the care management team and has been advised to call with any health related questions or concerns.         Patient requested to end the call prior to  completing assessments.   Tonia Frankel RN, CCM [place]  VBCI-Population Health RN Care Manager [phone]

## 2023-05-04 ENCOUNTER — Other Ambulatory Visit: Payer: Self-pay | Admitting: Student

## 2023-05-04 DIAGNOSIS — E785 Hyperlipidemia, unspecified: Secondary | ICD-10-CM

## 2023-05-07 ENCOUNTER — Other Ambulatory Visit: Payer: Self-pay

## 2023-05-08 ENCOUNTER — Encounter: Payer: Self-pay | Admitting: Student

## 2023-05-08 ENCOUNTER — Other Ambulatory Visit: Payer: Self-pay

## 2023-05-08 ENCOUNTER — Telehealth: Payer: Self-pay

## 2023-05-08 ENCOUNTER — Encounter: Admitting: Student

## 2023-05-08 ENCOUNTER — Ambulatory Visit: Admitting: Student

## 2023-05-08 VITALS — BP 137/78 | HR 86 | Temp 98.2°F | Ht 74.0 in

## 2023-05-08 DIAGNOSIS — I10 Essential (primary) hypertension: Secondary | ICD-10-CM

## 2023-05-08 DIAGNOSIS — E119 Type 2 diabetes mellitus without complications: Secondary | ICD-10-CM | POA: Diagnosis not present

## 2023-05-08 DIAGNOSIS — M67813 Other specified disorders of tendon, right shoulder: Secondary | ICD-10-CM

## 2023-05-08 DIAGNOSIS — Z794 Long term (current) use of insulin: Secondary | ICD-10-CM | POA: Diagnosis not present

## 2023-05-08 DIAGNOSIS — Z7985 Long-term (current) use of injectable non-insulin antidiabetic drugs: Secondary | ICD-10-CM

## 2023-05-08 DIAGNOSIS — E1165 Type 2 diabetes mellitus with hyperglycemia: Secondary | ICD-10-CM

## 2023-05-08 MED ORDER — METHOCARBAMOL 750 MG PO TABS: 750.0000 mg | ORAL_TABLET | Freq: Three times a day (TID) | ORAL | 0 refills | Status: AC | PRN

## 2023-05-08 MED ORDER — FREESTYLE LIBRE 2 SENSOR MISC: 3 refills | Status: DC

## 2023-05-08 NOTE — Progress Notes (Signed)
 CC: HFU  HPI:  [name] is a 48 y.o. male living with a history stated below and presents today for HFU. Please see problem based assessment and plan for additional details.  Presents today for hospital f/u after patient admitted on 4/17-4/22 for right arm swelling and pain concern for gouty arthritis and septic joint given hx of gouty arthritis in past, negative for crystals on arthrocentesis, MRI of R shoulder noted tendinosis and possible small supraspinatus tear with mild subacromial/subdeltoid bursitis.   Past Medical History:  Diagnosis Date   Acute venous embolism and thrombosis of deep vessels of proximal lower extremity (HCC) 07/19/2011   Anemia    Anginal pain (HCC)    pt denies   Anxiety    Chest pain, neg MI, normal coronaries by cath 02/18/2013   pt denies   CHF (congestive heart failure) (HCC)    Chronic renal disease, stage IV (HCC) 02/19/2013   Colon polyps    Depression    Diabetic ulcer of right foot (HCC)    DVT (deep venous thrombosis) (HCC) 09/2002   patient reports additional DVTs in '06 & '11 (unconfirmed)   ED (erectile dysfunction)    GERD (gastroesophageal reflux disease)    History of blood transfusion    "related to OR" (10/31/2016)   Hyperlipidemia 02/19/2013   Hypertension    Nausea & vomiting    "constant for the last couple weeks" (10/31/2016)   Nephrotic syndrome    Obesity    BMI 44, weight 346 pounds 01/30/14   OSA (obstructive sleep apnea) 02/28/2018   Mild obstructive sleep apnea with an AHI of 9.8/h but severe during REM sleep with an AHI of 33.8/h.  Oxygen  saturations dropped to 86% and there was moderate snoring   Peripheral edema    Pneumonia    Prosthesis adjustment 08/17/2016   Pulmonary embolism (HCC) 09/2002   treated with 6 months of warfarin   Pyelonephritis 02/02/2018   Type I diabetes mellitus (HCC) dx'd 2001   Urinary retention     Current Outpatient Medications on File Prior to Visit  Medication Sig Dispense  Refill   acetaminophen  (TYLENOL ) 325 MG tablet Take 2 tablets (650 mg total) by mouth every 6 (six) hours. (Patient taking differently: Take 650 mg by mouth every 6 (six) hours as needed for mild pain (pain score 1-3).) 30 tablet 0   allopurinol  (ZYLOPRIM ) 100 MG tablet Take 0.5 tablets (50 mg total) by mouth every other day. 30 tablet 2   amLODipine  (NORVASC ) 10 MG tablet Take 10 mg by mouth at bedtime.     carvedilol  (COREG ) 25 MG tablet TAKE 1 TABLET BY MOUTH TWICE DAILY WITH A MEAL 180 tablet 0   cloNIDine  (CATAPRES  - DOSED IN MG/24 HR) 0.1 mg/24hr patch Place 0.1 mg onto the skin once a week. Thursdays     diclofenac  Sodium (VOLTAREN ) 1 % GEL Apply 2 g topically 4 (four) times daily. (Patient taking differently: Apply 2 g topically daily as needed (for pain).) 100 g 3   ELIQUIS  5 MG TABS tablet Take 1 tablet by mouth twice daily 180 tablet 0   fluticasone  (FLONASE ) 50 MCG/ACT nasal spray Place 2 sprays into both nostrils daily. (Patient taking differently: Place 2 sprays into both nostrils daily as needed for allergies.) 15.8 mL 2   furosemide  (LASIX ) 80 MG tablet Take 1 tablet (80 mg total) by mouth 2 (two) times daily. (Patient taking differently: Take 80 mg by mouth 2 (two) times daily. Patient states  80mg  2x/day dose has resumed) 30 tablet 1   gabapentin  (NEURONTIN ) 100 MG capsule Take 100mg  in the morning, 100mg  in the afternoon, and 200mg  at night (Patient taking differently: Take 100-200 mg by mouth 3 (three) times daily. Take 100mg  by mouth in the morning, then take 100 mg by mouth in the afternoon, and 200 mg by mouth at night per patient) 270 capsule 0   hydrALAZINE  (APRESOLINE ) 100 MG tablet Take 100 mg by mouth 3 (three) times daily.     insulin  degludec (TRESIBA  FLEXTOUCH) 200 UNIT/ML FlexTouch Pen Inject 36 Units into the skin daily. (Patient taking differently: Inject 35 Units into the skin daily.) 15 mL 0   Insulin  Pen Needle 32G X 4 MM MISC Use as directed 100 each 0   lidocaine   (HM LIDOCAINE  PATCH) 4 % Place 1 patch onto the skin daily. (Patient not taking: Reported on 05/02/2023) 5 patch 0   lovastatin  (MEVACOR ) 20 MG tablet Take 1 tablet by mouth once daily 90 tablet 0   NOVOLOG  FLEXPEN 100 UNIT/ML FlexPen Inject 0-10 Units into the skin 3 (three) times daily with meals. Glucose 141-180: inject 4 units, Glucose 181-220: inject 6 units, Glucose 221-260: inject 8 units, Glucose 261-300: inject 10 units, If glucose if greater than 300, please inject 12 units and call your PCP and/or endocrinologist office 30 mL 4   oxybutynin  (DITROPAN ) 5 MG tablet TAKE 1 TABLET BY MOUTH THREE TIMES DAILY 90 tablet 0   pantoprazole  (PROTONIX ) 20 MG tablet Take 1 tablet (20 mg total) by mouth daily. (Patient not taking: Reported on 04/10/2023) 25 tablet 0   tamsulosin  (FLOMAX ) 0.4 MG CAPS capsule Take 1 capsule by mouth at bedtime 90 capsule 0   tirzepatide  (MOUNJARO ) 15 MG/0.5ML Pen Inject 15 mg into the skin once a week. 6 mL 4   No current facility-administered medications on file prior to visit.    Family History  Problem Relation Age of Onset   Heart disease Mother    Diabetes Mother    Diabetes Maternal Uncle    Diabetes Cousin     Social History   Socioeconomic History   Marital status: Single    Spouse name: Not on file   Number of children: 0   Years of education: 15.5   Highest education level: Some college, no degree  Occupational History   Occupation: Consulting civil engineer    Comment: [employer]   Occupation: disability  Tobacco Use   Smoking status: Never   Smokeless tobacco: Never  Vaping Use   Vaping status: Never Used  Substance and Sexual Activity   Alcohol use: Yes    Comment: 10/31/2016 "a few beers q 6 months or so"   Drug use: No   Sexual activity: Yes  Other Topics Concern   Not on file  Social History Narrative   Financial assistance approved for 100% discount at [facility] and has [facility] card per [employer]   09/01/2009.      Lives in [place] with mother and sister.             Social Drivers of Health   Financial Resource Strain: Medium Risk (01/31/2023)   Overall Financial Resource Strain (CARDIA)    Difficulty of Paying Living Expenses: Somewhat hard  Food Insecurity: No Food Insecurity (05/02/2023)   Hunger Vital Sign    Worried About Running Out of Food in the Last Year: Never true    Ran Out of Food in the Last Year: Never true  Transportation Needs:  No Transportation Needs (05/02/2023)   PRAPARE - Administrator, Civil Service (Medical): No    Lack of Transportation (Non-Medical): No  Physical Activity: Sufficiently Active (01/31/2023)   Exercise Vital Sign    Days of Exercise per Week: 6 days    Minutes of Exercise per Session: 120 min  Stress: No Stress Concern Present (01/31/2023)   [employer] of Occupational Health - Occupational Stress Questionnaire    Feeling of Stress : Not at all  Social Connections: Socially Isolated (01/31/2023)   Social Connection and Isolation Panel [NHANES]    Frequency of Communication with Friends and Family: More than three times a week    Frequency of Social Gatherings with Friends and Family: More than three times a week    Attends Religious Services: Never    Database administrator or Organizations: No    Attends Banker Meetings: Never    Marital Status: Never married  Intimate Partner Violence: Not At Risk (05/02/2023)   Humiliation, Afraid, Rape, and Kick questionnaire    Fear of Current or Ex-Partner: No    Emotionally Abused: No    Physically Abused: No    Sexually Abused: No    Review of Systems: ROS negative except for what is noted on the assessment and plan.  Vitals:   05/08/23 0816  BP: 137/78  Pulse: 86  Temp: 98.2 F (36.8 C)  TempSrc: Oral  SpO2: 97%  Height: 6\' 2"  (1.88 m)   Physical Exam: Constitutional: alert, sitting in wheelchair, in no acute distress Cardiovascular: regular rate and rhythm Pulmonary/Chest: normal work of breathing on room  air MSK: RUE in sling but improved ROM of right shoulder, decreased swelling Neurological: alert & oriented x 3  Assessment & Plan:   Tendinosis of right shoulder, possible supraspinatus tear Presents today for hospital f/u after patient admitted on 4/17-4/22 for right arm swelling and pain concern for gouty arthritis and septic joint given hx of gouty arthritis in past, negative for crystals on arthrocentesis, cultures negative so empiric abx stopped. MRI of R shoulder noted tendinosis and possible small supraspinatus tear with mild subacromial/subdeltoid bursitis. Orthopedics rec sling and patient has outpatient f/u with them. Has outpatient PT starting next month. Pain managed with voltaren  gel and heating pad, tylenol  has not helped much. ROM has improved but painful at times due to transfers given hx R AKA.   Plan -Improving, f/u with orthopedics and PT -Robaxin  750 mg TID PRN x 7 days  -Continue voltaren  gel and supportive care  Hypertension Stable. BP 137/78. Taking Coreg  25 mg BID, amlodipine  10 mg and hydralazine  100 mg TID. Reports adherence. Follows with [facility] for CKD 4.   Plan -Continue Coreg , amlodipine  and hydralazine   Insulin  dependent type 2 diabetes mellitus, controlled (HCC) Last A1c 4.9 in March (hx of CKD 4). Tresiba  decreased to 35 units daily at hospital. Denies hypoglycemia. He has f/u with endocrinology next week. Continues to take Novolog  SSI and Mounjaro  15 mg.    Patient discussed with Dr.  Wynn Hector, D.O. [facility] Internal Medicine, PGY-2 Phone: [phone] Date 05/08/2023 Time 9:03 AM

## 2023-05-08 NOTE — Assessment & Plan Note (Signed)
 Stable. BP 137/78. Taking Coreg  25 mg BID, amlodipine  10 mg and hydralazine  100 mg TID. Reports adherence. Follows with [facility] for CKD 4.   Plan -Continue Coreg , amlodipine  and hydralazine 

## 2023-05-08 NOTE — Assessment & Plan Note (Signed)
 Last A1c 4.9 in March (hx of CKD 4). Tresiba  decreased to 35 units daily at hospital. Denies hypoglycemia. He has f/u with endocrinology next week. Continues to take Novolog  SSI and Mounjaro  15 mg.

## 2023-05-08 NOTE — Assessment & Plan Note (Signed)
 Presents today for hospital f/u after patient admitted on 4/17-4/22 for right arm swelling and pain concern for gouty arthritis and septic joint given hx of gouty arthritis in past, negative for crystals on arthrocentesis, cultures negative so empiric abx stopped. MRI of R shoulder noted tendinosis and possible small supraspinatus tear with mild subacromial/subdeltoid bursitis. Orthopedics rec sling and patient has outpatient f/u with them. Has outpatient PT starting next month. Pain managed with voltaren  gel and heating pad, tylenol  has not helped much. ROM has improved but painful at times due to transfers given hx R AKA.   Plan -Improving, f/u with orthopedics and PT -Robaxin  750 mg TID PRN x 7 days  -Continue voltaren  gel and supportive care

## 2023-05-08 NOTE — Patient Instructions (Addendum)
 Thank you, [name] for allowing us  to provide your care today. Today we discussed   -Robaxin  750 mg every 8 hours as needed, be aware it can cause drowsiness after taking it  -Glad to hear the shoulder is doing better  -Follow with the orthopedics and physical therapy -Continue voltaren  gel and heating pad for pain   -Follow with your endocrinologist for your diabetes, your Tresiba  was reduced at your hospital stay  Follow up:  1-2 months    Should you have any questions or concerns please call the internal medicine clinic at [phone].    Kahlen Boyde, D.O. [facility]

## 2023-05-09 ENCOUNTER — Telehealth: Payer: Self-pay

## 2023-05-09 NOTE — Transitions of Care (Post Inpatient/ED Visit) (Signed)
  Transition of Care week 2  Visit Note  05/09/2023  Name: [name] MRN: [id number]          DOB: 12/02/1975  Situation: Patient enrolled in [facility] 30-day program. Visit completed with patient by telephone.   Background: Patient with admission 4/17 - 05/01/23 at [facility] for inflammatory arthropathy right shoulder. Patient states he is in the process of studying and taking tests and has graduation planned 05/24/23 and so call was short and patient declined medication review and states he was just seen yesterday by PCP  Initial Transition Care Management Follow-up Telephone Call    Past Medical History:  Diagnosis Date   Acute venous embolism and thrombosis of deep vessels of proximal lower extremity (HCC) 07/19/2011   Anemia    Anginal pain (HCC)    pt denies   Anxiety    Chest pain, neg MI, normal coronaries by cath 02/18/2013   pt denies   CHF (congestive heart failure) (HCC)    Chronic renal disease, stage IV (HCC) 02/19/2013   Colon polyps    Depression    Diabetic ulcer of right foot (HCC)    DVT (deep venous thrombosis) (HCC) 09/2002   patient reports additional DVTs in '06 & '11 (unconfirmed)   ED (erectile dysfunction)    GERD (gastroesophageal reflux disease)    History of blood transfusion    "related to OR" (10/31/2016)   Hyperlipidemia 02/19/2013   Hypertension    Nausea & vomiting    "constant for the last couple weeks" (10/31/2016)   Nephrotic syndrome    Obesity    BMI 44, weight 346 pounds 01/30/14   OSA (obstructive sleep apnea) 02/28/2018   Mild obstructive sleep apnea with an AHI of 9.8/h but severe during REM sleep with an AHI of 33.8/h.  Oxygen  saturations dropped to 86% and there was moderate snoring   Peripheral edema    Pneumonia    Prosthesis adjustment 08/17/2016   Pulmonary embolism (HCC) 09/2002   treated with 6 months of warfarin   Pyelonephritis 02/02/2018   Type I diabetes mellitus (HCC) dx'd 2001   Urinary retention      Assessment: Patient Reported Symptoms: Cognitive        Neurological      HEENT        Cardiovascular      Respiratory      Endocrine Patient reports the following symptoms related to hypoglycemia or hyperglycemia : No symptoms reported    Gastrointestinal Gastrointestinal Symptoms Reported: No symptoms reported      Genitourinary Genitourinary Symptoms Reported: No symptoms reported    Integumentary Integumentary Symptoms Reported: No symptoms reported    Musculoskeletal          Psychosocial           There were no vitals filed for this visit.  Medications Reviewed Today   Medications were not reviewed in this encounter     Recommendation:   Follow with plan to attend OT/PT beginning 05/22/23  Follow Up Plan:   Telephone follow up appointment date/time:  05/17/23 3pm  Tonia Frankel RN, CCM [place]  VBCI-Population Health RN Care Manager [phone]

## 2023-05-10 ENCOUNTER — Encounter (HOSPITAL_COMMUNITY): Payer: Self-pay

## 2023-05-10 ENCOUNTER — Encounter (HOSPITAL_COMMUNITY)

## 2023-05-11 ENCOUNTER — Encounter (HOSPITAL_COMMUNITY)
Admission: RE | Admit: 2023-05-11 | Discharge: 2023-05-11 | Disposition: A | Discharge status: Home / Self Care | Source: Ambulatory Visit | Attending: Nephrology | Admitting: Nephrology

## 2023-05-11 VITALS — BP 138/73 | HR 100 | Temp 97.0°F | Resp 17

## 2023-05-11 DIAGNOSIS — N184 Chronic kidney disease, stage 4 (severe): Secondary | ICD-10-CM | POA: Diagnosis not present

## 2023-05-11 LAB — IRON AND TIBC
Iron: 45 ug/dL (ref 45–182)
Saturation Ratios: 16 % — ABNORMAL LOW (ref 17.9–39.5)
TIBC: 287 ug/dL (ref 250–450)
UIBC: 242 ug/dL

## 2023-05-11 LAB — RENAL FUNCTION PANEL
Albumin: 3.4 g/dL — ABNORMAL LOW (ref 3.5–5.0)
Anion gap: 10 (ref 5–15)
BUN: 56 mg/dL — ABNORMAL HIGH (ref 6–20)
CO2: 22 mmol/L (ref 22–32)
Calcium: 9.1 mg/dL (ref 8.9–10.3)
Chloride: 106 mmol/L (ref 98–111)
Creatinine, Ser: 2.94 mg/dL — ABNORMAL HIGH (ref 0.61–1.24)
GFR, Estimated: 26 mL/min — ABNORMAL LOW (ref >60)
Glucose, Bld: 74 mg/dL (ref 70–99)
Phosphorus: 4.5 mg/dL (ref 2.5–4.6)
Potassium: 4.3 mmol/L (ref 3.5–5.1)
Sodium: 138 mmol/L (ref 135–145)

## 2023-05-11 LAB — POCT HEMOGLOBIN-HEMACUE: Hemoglobin: 10 g/dL — ABNORMAL LOW (ref 13.0–17.0)

## 2023-05-11 LAB — FERRITIN: Ferritin: 324 ng/mL (ref 24–336)

## 2023-05-11 MED ORDER — DARBEPOETIN ALFA 200 MCG/0.4ML IJ SOSY
PREFILLED_SYRINGE | INTRAMUSCULAR | Status: AC
  Filled 2023-05-11: qty 0.4

## 2023-05-11 MED ORDER — DARBEPOETIN ALFA 200 MCG/0.4ML IJ SOSY
200.0000 ug | PREFILLED_SYRINGE | INTRAMUSCULAR | Status: DC
  Administered 2023-05-11: 200 ug via SUBCUTANEOUS

## 2023-05-12 NOTE — Progress Notes (Signed)
 Internal Medicine Clinic Attending  Case discussed with the resident at the time of the visit.  We reviewed the resident's history and exam and pertinent patient test results.  I agree with the assessment, diagnosis, and plan of care documented in the resident's note.

## 2023-05-15 ENCOUNTER — Encounter (HOSPITAL_COMMUNITY): Payer: Self-pay

## 2023-05-15 ENCOUNTER — Other Ambulatory Visit: Payer: Self-pay

## 2023-05-15 ENCOUNTER — Ambulatory Visit (INDEPENDENT_AMBULATORY_CARE_PROVIDER_SITE_OTHER): Payer: Medicare HMO | Admitting: Endocrinology

## 2023-05-15 ENCOUNTER — Encounter: Payer: Self-pay | Admitting: Endocrinology

## 2023-05-15 VITALS — BP 118/72 | HR 86 | Resp 16 | Ht 74.0 in | Wt 317.0 lb

## 2023-05-15 DIAGNOSIS — E1165 Type 2 diabetes mellitus with hyperglycemia: Secondary | ICD-10-CM

## 2023-05-15 DIAGNOSIS — Z794 Long term (current) use of insulin: Secondary | ICD-10-CM | POA: Diagnosis not present

## 2023-05-15 DIAGNOSIS — E119 Type 2 diabetes mellitus without complications: Secondary | ICD-10-CM | POA: Diagnosis not present

## 2023-05-15 MED ORDER — FREESTYLE LIBRE 2 READER DEVI
1.0000 | Freq: Every day | 0 refills | Status: AC
Start: 2023-05-15 — End: ?

## 2023-05-15 MED ORDER — FREESTYLE LIBRE 2 READER DEVI: 0 refills | Status: AC

## 2023-05-15 NOTE — Progress Notes (Signed)
 Outpatient Endocrinology Note Victor Kathrine Rieves, MD  05/15/23  Patient's Name: [name]    DOB: August 21, 1975    MRN: [id number]                                                    REASON OF VISIT: Follow up for type 2 diabetes mellitus  PCP: Fay Hoop, MD  HISTORY OF PRESENT ILLNESS:   [name] is a 48 y.o. old male with past medical history listed below, is here for follow up of type 2 diabetes mellitus.   Pertinent Diabetes History: Patient was diagnosed with type 2 diabetes mellitus in 2001.  Weight was 400 pounds at the time of diagnosis.  He was started on insulin  therapy initially.   Chronic Diabetes Complications : Retinopathy: Unknown.  Reports following with retina specialist however denies diabetes retinopathy. Nephropathy: CKD IV following with nephrology.  Microalbuminuria present. Peripheral neuropathy: Diabetic foot ulcers, status post right AKA 2015/2016. Coronary artery disease: no Stroke: no  Relevant comorbidities and cardiovascular risk factors: Obesity: yes Body mass index is 40.7 kg/m.  Hypertension: yes Hyperlipidemia. Yes, on a statin.  Current / Home Diabetic regimen includes: Tresiba  36 units daily. NovoLog  0 units breakfast 0-4 units before lunch and 0-4 units at dinner.  Lately has not been taking NovoLog . Mounjaro  15 mg weekly.  Prior diabetic medications: Metformin  stopped due to diarrhea. Ozempic  had vomiting.  Glycemic data:    CONTINUOUS GLUCOSE MONITORING SYSTEM (CGMS) INTERPRETATION:                         FreeStyle Libre 2 CGM-  Sensor Download (Sensor download was reviewed and summarized below.)  Not able to download CGM data into clinic today.  Glucose data directly reviewed from the freestyle Surveyor, mining.  In last 1 week he did not have hypoglycemia, mostly acceptable blood sugar overnight, and in the afternoon.  He did not have hypoglycemic episode in last 2 weeks.  He had few hypoglycemic episodes in last 30 days  reports related to exercise.  Hypoglycemia: Patient has no hypoglycemic episodes. Patient has hypoglycemia awareness.  Factors modifying glucose control: 1.  Diabetic diet assessment: 2 meals a day.  2.  Staying active or exercising: Workout a mostly every day 5-6 times a week in the morning.  3.  Medication compliance: compliant all of the time.  Interval history  Not able to download CGM in the clinic today.  Glucose data reviewed directly from the receiver.  He is having issues with receiver on and off.  No hypoglycemia lately.  Recently he has right shoulder tendinitis and planning for physical therapy in few days.  He has not been at intense with exercise at the gym.  Diabetes regimen is reviewed and noted above.  He denies hypoglycemic symptoms.  Recent hemoglobin A1c was 4.9% likely falsely low due to anemia.  No other complaints today.  REVIEW OF SYSTEMS As per history of present illness.   PAST MEDICAL HISTORY: Past Medical History:  Diagnosis Date   Acute venous embolism and thrombosis of deep vessels of proximal lower extremity (HCC) 07/19/2011   Anemia    Anginal pain (HCC)    pt denies   Anxiety    Chest pain, neg MI, normal coronaries by cath 02/18/2013   pt denies  CHF (congestive heart failure) (HCC)    Chronic renal disease, stage IV (HCC) 02/19/2013   Colon polyps    Depression    Diabetic ulcer of right foot (HCC)    DVT (deep venous thrombosis) (HCC) 09/2002   patient reports additional DVTs in '06 & '11 (unconfirmed)   ED (erectile dysfunction)    GERD (gastroesophageal reflux disease)    History of blood transfusion    "related to OR" (10/31/2016)   Hyperlipidemia 02/19/2013   Hypertension    Nausea & vomiting    "constant for the last couple weeks" (10/31/2016)   Nephrotic syndrome    Obesity    BMI 44, weight 346 pounds 01/30/14   OSA (obstructive sleep apnea) 02/28/2018   Mild obstructive sleep apnea with an AHI of 9.8/h but severe during REM  sleep with an AHI of 33.8/h.  Oxygen  saturations dropped to 86% and there was moderate snoring   Peripheral edema    Pneumonia    Prosthesis adjustment 08/17/2016   Pulmonary embolism (HCC) 09/2002   treated with 6 months of warfarin   Pyelonephritis 02/02/2018   Type I diabetes mellitus (HCC) dx'd 2001   Urinary retention     PAST SURGICAL HISTORY: Past Surgical History:  Procedure Laterality Date   AMPUTATION Right 12/22/2013   Procedure: AMPUTATION BELOW KNEE;  Surgeon: Amada Backer, MD;  Location: [facility] OR;  Service: Orthopedics;  Laterality: Right;   AMPUTATION Right 02/19/2014   Procedure: RIGHT ABOVE KNEE AMPUTATION ;  Surgeon: Amada Backer, MD;  Location: [facility] OR;  Service: Orthopedics;  Laterality: Right;   blood clot removal  2016   CARDIAC CATHETERIZATION  02/18/2013   normal coronaries   ESOPHAGOGASTRODUODENOSCOPY N/A 11/02/2016   Procedure: ESOPHAGOGASTRODUODENOSCOPY (EGD);  Surgeon: Kenney Peacemaker, MD;  [facility] ENDOSCOPY;  Service: Endoscopy;  Laterality: N/A;   I & D EXTREMITY Right 12/14/2013   Procedure: IRRIGATION AND DEBRIDEMENT RIGHT FOOT;  Surgeon: Lorriane Rote, MD;  Location: [facility] OR;  Service: Orthopedics;  Laterality: Right;   INCISION AND DRAINAGE ABSCESS  2007; 2015   "back"   INCISION AND DRAINAGE OF WOUND Right 12/22/2013   Procedure: I&D RIGHT BUTTOCK;  Surgeon: Amada Backer, MD;  Location: [facility] OR;  Service: Orthopedics;  Laterality: Right;   LEFT HEART CATHETERIZATION WITH CORONARY ANGIOGRAM N/A 02/18/2013   Procedure: LEFT HEART CATHETERIZATION WITH CORONARY ANGIOGRAM;  Surgeon: Peter M Swaziland, MD;  Location: [facility] CATH LAB;  Service: Cardiovascular;  Laterality: N/A;    ALLERGIES: Allergies  Allergen Reactions   Reglan  [Metoclopramide ] Other (See Comments)    Dysphoric reaction   Chlorthalidone Hives   Metformin  And Related Diarrhea   Nsaids Other (See Comments)    Chronic kidney disease IV   Ozempic  (0.25 Or 0.5 Mg-Dose) [Semaglutide (0.25 Or  0.5mg -Dos)] Nausea And Vomiting   Thiazide-Type Diuretics Other (See Comments)    Gout    FAMILY HISTORY:  Family History  Problem Relation Age of Onset   Heart disease Mother    Diabetes Mother    Diabetes Maternal Uncle    Diabetes Cousin     SOCIAL HISTORY: Social History   Socioeconomic History   Marital status: Single    Spouse name: Not on file   Number of children: 0   Years of education: 15.5   Highest education level: Some college, no degree  Occupational History   Occupation: student    Comment: [employer]   Occupation: disability  Tobacco Use   Smoking status: Never   Smokeless  tobacco: Never  Vaping Use   Vaping status: Never Used  Substance and Sexual Activity   Alcohol use: Yes    Comment: 10/31/2016 "a few beers q 6 months or so"   Drug use: No   Sexual activity: Yes  Other Topics Concern   Not on file  Social History Narrative   Financial assistance approved for 100% discount at [facility] and has [facility] card per [employer]   09/01/2009.      Lives in [place] with mother and sister.            Social Drivers of Health   Financial Resource Strain: Medium Risk (01/31/2023)   Overall Financial Resource Strain (CARDIA)    Difficulty of Paying Living Expenses: Somewhat hard  Food Insecurity: No Food Insecurity (05/02/2023)   Hunger Vital Sign    Worried About Running Out of Food in the Last Year: Never true    Ran Out of Food in the Last Year: Never true  Transportation Needs: No Transportation Needs (05/02/2023)   PRAPARE - Administrator, Civil Service (Medical): No    Lack of Transportation (Non-Medical): No  Physical Activity: Sufficiently Active (01/31/2023)   Exercise Vital Sign    Days of Exercise per Week: 6 days    Minutes of Exercise per Session: 120 min  Stress: No Stress Concern Present (01/31/2023)   [employer] of Occupational Health - Occupational Stress Questionnaire    Feeling of Stress : Not at all  Social Connections:  Socially Isolated (01/31/2023)   Social Connection and Isolation Panel [NHANES]    Frequency of Communication with Friends and Family: More than three times a week    Frequency of Social Gatherings with Friends and Family: More than three times a week    Attends Religious Services: Never    Database administrator or Organizations: No    Attends Banker Meetings: Never    Marital Status: Never married    MEDICATIONS:  Current Outpatient Medications  Medication Sig Dispense Refill   acetaminophen  (TYLENOL ) 325 MG tablet Take 2 tablets (650 mg total) by mouth every 6 (six) hours. (Patient taking differently: Take 650 mg by mouth every 6 (six) hours as needed for mild pain (pain score 1-3).) 30 tablet 0   allopurinol  (ZYLOPRIM ) 100 MG tablet Take 0.5 tablets (50 mg total) by mouth every other day. 30 tablet 2   amLODipine  (NORVASC ) 10 MG tablet Take 10 mg by mouth at bedtime.     carvedilol  (COREG ) 25 MG tablet TAKE 1 TABLET BY MOUTH TWICE DAILY WITH A MEAL 180 tablet 0   cloNIDine  (CATAPRES  - DOSED IN MG/24 HR) 0.1 mg/24hr patch Place 0.1 mg onto the skin once a week. Thursdays     Continuous Glucose Receiver (FREESTYLE LIBRE 2 READER) DEVI 1 each by Does not apply route daily. 1 each 0   Continuous Glucose Sensor (FREESTYLE LIBRE 2 SENSOR) MISC Change every 14 days as directed by manufacturer 6 each 3   diclofenac  Sodium (VOLTAREN ) 1 % GEL Apply 2 g topically 4 (four) times daily. (Patient taking differently: Apply 2 g topically daily as needed (for pain).) 100 g 3   ELIQUIS  5 MG TABS tablet Take 1 tablet by mouth twice daily 180 tablet 0   fluticasone  (FLONASE ) 50 MCG/ACT nasal spray Place 2 sprays into both nostrils daily. (Patient taking differently: Place 2 sprays into both nostrils daily as needed for allergies.) 15.8 mL 2   furosemide  (LASIX ) 80  MG tablet Take 1 tablet (80 mg total) by mouth 2 (two) times daily. (Patient taking differently: Take 80 mg by mouth 2 (two) times  daily. Patient states 80mg  2x/day dose has resumed) 30 tablet 1   gabapentin  (NEURONTIN ) 100 MG capsule Take 100mg  in the morning, 100mg  in the afternoon, and 200mg  at night (Patient taking differently: Take 100-200 mg by mouth 3 (three) times daily. Take 100mg  by mouth in the morning, then take 100 mg by mouth in the afternoon, and 200 mg by mouth at night per patient) 270 capsule 0   hydrALAZINE  (APRESOLINE ) 100 MG tablet Take 100 mg by mouth 3 (three) times daily.     insulin  degludec (TRESIBA  FLEXTOUCH) 200 UNIT/ML FlexTouch Pen Inject 36 Units into the skin daily. (Patient taking differently: Inject 35 Units into the skin daily.) 15 mL 0   Insulin  Pen Needle 32G X 4 MM MISC Use as directed 100 each 0   lidocaine  (HM LIDOCAINE  PATCH) 4 % Place 1 patch onto the skin daily. 5 patch 0   lovastatin  (MEVACOR ) 20 MG tablet Take 1 tablet by mouth once daily 90 tablet 0   methocarbamol  (ROBAXIN -750) 750 MG tablet Take 1 tablet (750 mg total) by mouth every 8 (eight) hours as needed for up to 7 days for muscle spasms. 21 tablet 0   NOVOLOG  FLEXPEN 100 UNIT/ML FlexPen Inject 0-10 Units into the skin 3 (three) times daily with meals. Glucose 141-180: inject 4 units, Glucose 181-220: inject 6 units, Glucose 221-260: inject 8 units, Glucose 261-300: inject 10 units, If glucose if greater than 300, please inject 12 units and call your PCP and/or endocrinologist office 30 mL 4   oxybutynin  (DITROPAN ) 5 MG tablet TAKE 1 TABLET BY MOUTH THREE TIMES DAILY 90 tablet 0   pantoprazole  (PROTONIX ) 20 MG tablet Take 1 tablet (20 mg total) by mouth daily. 25 tablet 0   tamsulosin  (FLOMAX ) 0.4 MG CAPS capsule Take 1 capsule by mouth at bedtime 90 capsule 0   tirzepatide  (MOUNJARO ) 15 MG/0.5ML Pen Inject 15 mg into the skin once a week. 6 mL 4   Continuous Glucose Receiver (FREESTYLE LIBRE 2 READER) DEVI Use with sensors to monitor blood glucose 1 each 0   No current facility-administered medications for this visit.     PHYSICAL EXAM: Vitals:   05/15/23 0920  BP: 118/72  Pulse: 86  Resp: 16  SpO2: 95%  Weight: (!) 317 lb (143.8 kg)  Height: 6\' 2"  (1.88 m)     Body mass index is 40.7 kg/m.  Wt Readings from Last 3 Encounters:  05/15/23 (!) 317 lb (143.8 kg)  04/26/23 (!) 330 lb (149.7 kg)  04/26/23 (!) 316 lb (143.3 kg)    General: Well developed, well nourished male in no apparent distress.  HEENT: AT/[place], no external lesions.  Eyes: Conjunctiva clear and no icterus. Neck: Neck supple  Lungs: Respirations not labored Neurologic: Alert, oriented, normal speech Extremities / Skin: Dry. Rt AKA. Psychiatric: Does not appear depressed or anxious  Diabetic Foot Exam - Simple   No data filed    LABS Reviewed Lab Results  Component Value Date   HGBA1C 4.9 03/26/2023   HGBA1C 5.2 12/06/2022   HGBA1C 5.0 08/28/2022   Lab Results  Component Value Date   FRUCTOSAMINE 271 08/28/2022   FRUCTOSAMINE 299 (H) 05/09/2022   FRUCTOSAMINE 294 (H) 08/23/2021   Lab Results  Component Value Date   CHOL 119 12/30/2021   HDL 38.10 (L) 12/30/2021   LDLCALC  60 12/30/2021   TRIG 104.0 12/30/2021   CHOLHDL 3 12/30/2021   Lab Results  Component Value Date   MICRALBCREAT 1,528.4 (H) 02/15/2017   MICRALBCREAT 818.2 (H) 08/14/2013   Lab Results  Component Value Date   CREATININE 2.94 (H) 05/11/2023   Lab Results  Component Value Date   GFR 28.51 (L) 04/15/2020    ASSESSMENT / PLAN  1. Insulin  dependent type 2 diabetes mellitus, controlled (HCC)    Diabetes Mellitus type 2, complicated by peripheral neuropathy status post right BKA, CKD. - Diabetic status / severity: Controlled  Lab Results  Component Value Date   HGBA1C 4.9 03/26/2023    - Hemoglobin A1c goal : <6.5%  Mostly acceptable blood sugar in last 1 week.  No hypoglycemic episodes.  Hemoglobin A1c 4.9% in March however likely falsely low due to anemia.  He has not been requiring NovoLog  lately.  He is mainly taking  Tresiba  36 units daily and Mounjaro  15 mg weekly.  - Medications: See below.  Continue Tresiba  36 units daily for now.  Discussed about decreasing Tresiba  to 32 units if blood sugar is persistently below 100 in the morning fasting especially when he starts physical therapy or intense exercise.   Continue Mounjaro  15 mg weekly.  Novolog  0-4 units with meals 2 times a day , may adjust based on meals and experience.  No NovoLog  with breakfast.  - Home glucose testing: Continue CGM and check as needed.  Will send new receiver for freestyle libre 2.  He is having issues with freestyle libre 2 receiver.  - Discussed/ Gave Hypoglycemia treatment plan.  He has Glucagon  Emergency Kit prescribed.  Advised to use glucose tablet for correction of hypoglycemia.  # Consult : not required at this time.   # He has diabetic nephropathy, following with nephrology.   Last  Lab Results  Component Value Date   MICRALBCREAT 1,528.4 (H) 02/15/2017    # Foot check nightly, history of diabetic foot ulcer status post right AKA.  # Annual dilated diabetic eye exams.  Following with retina specialist, status post retinopathy unknown at this time.  - Diet: Eat reasonable portion sizes to promote a healthy weight - Life style / activity / exercise: Discussed.  He regularly workout 5-6 times a week.  2. Blood pressure  -  BP Readings from Last 1 Encounters:  05/15/23 118/72    - Control is in target. Blood pressure acceptable. - No change in current plans.  3. Lipid status / Hyperlipidemia - Last  Lab Results  Component Value Date   LDLCALC 60 12/30/2021   - Continue lovastatin  20 mg daily.  Managed by primary care provider.  Diagnoses and all orders for this visit:  Insulin  dependent type 2 diabetes mellitus, controlled (HCC) -     Continuous Glucose Receiver (FREESTYLE LIBRE 2 READER) DEVI; 1 each by Does not apply route daily.    DISPOSITION Follow up in clinic in 3 months suggested.      All questions answered and patient verbalized understanding of the plan.  Victor Kiri Hinderliter, MD [facility] End[facility] [address] [place], [address] [place], [place] [address] Phone # [phone]  At least part of this note was generated using voice recognition software. Inadvertent word errors may have occurred, which were not recognized during the proofreading process.

## 2023-05-16 ENCOUNTER — Other Ambulatory Visit: Payer: Self-pay

## 2023-05-17 ENCOUNTER — Telehealth: Payer: Self-pay

## 2023-05-17 DIAGNOSIS — N2581 Secondary hyperparathyroidism of renal origin: Secondary | ICD-10-CM | POA: Diagnosis not present

## 2023-05-17 DIAGNOSIS — I130 Hypertensive heart and chronic kidney disease with heart failure and stage 1 through stage 4 chronic kidney disease, or unspecified chronic kidney disease: Secondary | ICD-10-CM | POA: Diagnosis not present

## 2023-05-17 DIAGNOSIS — N184 Chronic kidney disease, stage 4 (severe): Secondary | ICD-10-CM | POA: Diagnosis not present

## 2023-05-17 DIAGNOSIS — D631 Anemia in chronic kidney disease: Secondary | ICD-10-CM | POA: Diagnosis not present

## 2023-05-17 DIAGNOSIS — E876 Hypokalemia: Secondary | ICD-10-CM | POA: Diagnosis not present

## 2023-05-17 DIAGNOSIS — E1122 Type 2 diabetes mellitus with diabetic chronic kidney disease: Secondary | ICD-10-CM | POA: Diagnosis not present

## 2023-05-17 DIAGNOSIS — Z794 Long term (current) use of insulin: Secondary | ICD-10-CM | POA: Diagnosis not present

## 2023-05-17 DIAGNOSIS — I5030 Unspecified diastolic (congestive) heart failure: Secondary | ICD-10-CM | POA: Diagnosis not present

## 2023-05-17 NOTE — Transitions of Care (Post Inpatient/ED Visit) (Signed)
 Transition of Care week 3  Visit Note  05/17/2023  Name: [name] MRN: [id number]          DOB: Oct 09, 1975  Situation: Patient enrolled in [facility] 30-day program. Visit completed with patient by telephone.   Background: Patient with admission 4/17 - 05/01/23 at [facility] for inflammatory arthropathy right shoulder. Patient states he is looking forward to graduation planned 05/24/23. Patient confirms Aranesp  admiinstration at infusion center 5/2 and Endo appointment 5/6. Patient also states he was seeing today 5/8 at [facility] and reports he was told to increase his Lasix  to 100mg  2x/day - TOC updated medication list and also called [facility] for confirmation of dose increase - awaiting callback   Initial Transition Care Management Follow-up Telephone Call    Past Medical History:  Diagnosis Date   Acute venous embolism and thrombosis of deep vessels of proximal lower extremity (HCC) 07/19/2011   Anemia    Anginal pain (HCC)    pt denies   Anxiety    Chest pain, neg MI, normal coronaries by cath 02/18/2013   pt denies   CHF (congestive heart failure) (HCC)    Chronic renal disease, stage IV (HCC) 02/19/2013   Colon polyps    Depression    Diabetic ulcer of right foot (HCC)    DVT (deep venous thrombosis) (HCC) 09/2002   patient reports additional DVTs in '06 & '11 (unconfirmed)   ED (erectile dysfunction)    GERD (gastroesophageal reflux disease)    History of blood transfusion    "related to OR" (10/31/2016)   Hyperlipidemia 02/19/2013   Hypertension    Nausea & vomiting    "constant for the last couple weeks" (10/31/2016)   Nephrotic syndrome    Obesity    BMI 44, weight 346 pounds 01/30/14   OSA (obstructive sleep apnea) 02/28/2018   Mild obstructive sleep apnea with an AHI of 9.8/h but severe during REM sleep with an AHI of 33.8/h.  Oxygen  saturations dropped to 86% and there was moderate snoring   Peripheral edema    Pneumonia    Prosthesis adjustment  08/17/2016   Pulmonary embolism (HCC) 09/2002   treated with 6 months of warfarin   Pyelonephritis 02/02/2018   Type I diabetes mellitus (HCC) dx'd 2001   Urinary retention     Assessment: Patient Reported Symptoms: Cognitive        Neurological Neurological Review of Symptoms: No symptoms reported    HEENT HEENT Symptoms Reported: No symptoms reported      Cardiovascular      Respiratory Respiratory Symptoms Reported: No symptoms reported    Endocrine Patient reports the following symptoms related to hypoglycemia or hyperglycemia : No symptoms reported Is patient diabetic?: Yes Is patient checking blood sugars at home?: Yes Endocrine Conditions: Diabetes Endocrine Management Strategies: Medication therapy Endocrine Comment: Patient saw Endocrinology, [place] Thapa 05/15/23 no medication changes  Gastrointestinal Gastrointestinal Symptoms Reported: No symptoms reported      Genitourinary Genitourinary Symptoms Reported: No symptoms reported    Integumentary Integumentary Symptoms Reported: No symptoms reported    Musculoskeletal          Psychosocial           There were no vitals filed for this visit.  Medications Reviewed Today     Reviewed by Sharmaine Dearth, RN (Registered Nurse) on 05/17/23 at 1221  Med List Status: <None>   Medication Order Taking? Sig Documenting Provider Last Dose Status Informant  acetaminophen  (TYLENOL ) 325 MG tablet [id number]  Yes Take 2 tablets (650 mg total) by mouth every 6 (six) hours.  Patient taking differently: Take 650 mg by mouth every 6 (six) hours as needed for mild pain (pain score 1-3).   Aurora Lees, DO Taking Active Self  allopurinol  (ZYLOPRIM ) 100 MG tablet [id number] Yes Take 0.5 tablets (50 mg total) by mouth every other day. Sheree Dieter, MD Taking Active Self  amLODipine  (NORVASC ) 10 MG tablet [id number] Yes Take 10 mg by mouth at bedtime. [provider] Taking Active Self  carvedilol  (COREG ) 25 MG  tablet [id number] Yes TAKE 1 TABLET BY MOUTH TWICE DAILY WITH A MEAL Amoako, Prince, MD Taking Active Self  cloNIDine  (CATAPRES  - DOSED IN MG/24 HR) 0.1 mg/24hr patch [id number] Yes Place 0.1 mg onto the skin once a week. Thursdays [provider] Taking Active Self           Med Note Baltazar Leventhal, [place]   Fri Apr 27, 2023  3:14 PM)    Continuous Glucose Receiver (FREESTYLE LIBRE 2 READER) DEVI [id number] Yes Use with sensors to monitor blood glucose Thapa, [place], MD Taking Active   Continuous Glucose Receiver (FREESTYLE LIBRE 2 READER) DEVI [id number] Yes 1 each by Does not apply route daily. Thapa, [place], MD Taking Active   Continuous Glucose Sensor (FREESTYLE LIBRE 2 SENSOR) [place] [id number] Yes Change every 14 days as directed by manufacturer Thapa, [place], MD Taking Active   diclofenac  Sodium (VOLTAREN ) 1 % GEL [id number] Yes Apply 2 g topically 4 (four) times daily.  Patient taking differently: Apply 2 g topically daily as needed (for pain).   Freada Jacobs, MD Taking Active Self  ELIQUIS  5 MG TABS tablet [id number] Yes Take 1 tablet by mouth twice daily Priscella Brooms, DO Taking Active Self           Med Note (SATTERFIELD, DARIUS E   Sun Apr 29, 2023  8:47 PM) @1800   fluticasone  (FLONASE ) 50 MCG/ACT nasal spray [id number] Yes Place 2 sprays into both nostrils daily.  Patient taking differently: Place 2 sprays into both nostrils daily as needed for allergies.   Freada Jacobs, MD Taking Active Self  furosemide  (LASIX ) 80 MG tablet [id number] Yes Take 1 tablet (80 mg total) by mouth 2 (two) times daily.  Patient taking differently: Take 100 mg by mouth 2 (two) times daily. Patient states he saw [facility] 05/17/23 and states this was increased to 100mg  2x/day   Aurora Lees, DO Taking Active Self           Med Note [facility], Divonte Senger A   Tue Mar 27, 2023  9:40 AM) Patient reports 03/22/23 nephrology instructed patient to take 80mg  M,W,F 3x/day; and all other days 80mg  2x/day  gabapentin   (NEURONTIN ) 100 MG capsule [id number] Yes Take 100mg  in the morning, 100mg  in the afternoon, and 200mg  at night  Patient taking differently: Take 100-200 mg by mouth 3 (three) times daily. Take 100mg  by mouth in the morning, then take 100 mg by mouth in the afternoon, and 200 mg by mouth at night per patient   Sheree Dieter, MD Taking Active Self           Med Note Baltazar Leventhal, [place]   Fri Apr 27, 2023  3:15 PM)    hydrALAZINE  (APRESOLINE ) 100 MG tablet [id number] Yes Take 100 mg by mouth 3 (three) times daily. [provider] Taking Active Self  insulin  degludec (TRESIBA  FLEXTOUCH) 200 UNIT/ML FlexTouch Pen [id number] Yes Inject 36 Units into the skin daily.  Patient taking differently:  Inject 35 Units into the skin daily.   Jayson Michael, MD Taking Active   Insulin  Pen Needle 32G X 4 MM MISC [id number] Yes Use as directed  Taking Active   lidocaine  (HM LIDOCAINE  PATCH) 4 % [id number] No Place 1 patch onto the skin daily.  Patient not taking: Reported on 05/17/2023   True Fuss, MD Not Taking Active Self  lovastatin  (MEVACOR ) 20 MG tablet [id number] Yes Take 1 tablet by mouth once daily Priscella Brooms, DO Taking Active   NOVOLOG  FLEXPEN 100 UNIT/ML FlexPen [id number] Yes Inject 0-10 Units into the skin 3 (three) times daily with meals. Glucose 141-180: inject 4 units, Glucose 181-220: inject 6 units, Glucose 221-260: inject 8 units, Glucose 261-300: inject 10 units, If glucose if greater than 300, please inject 12 units and call your PCP and/or endocrinologist office Aurora Lees, DO Taking Active Self  oxybutynin  (DITROPAN ) 5 MG tablet [id number] Yes TAKE 1 TABLET BY MOUTH THREE TIMES DAILY Priscella Brooms, DO Taking Active Self  pantoprazole  (PROTONIX ) 20 MG tablet [id number] No Take 1 tablet (20 mg total) by mouth daily.  Patient not taking: Reported on 05/17/2023   Aurora Lees, DO Not Taking Consider Medication Status and Discontinue (No longer needed (for PRN medications)) Self            Med Note [facility], Seleny Allbright A   Mon Mar 19, 2023 10:00 AM) Patient states he does not have this medication and states he does not have reflux  tamsulosin  (FLOMAX ) 0.4 MG CAPS capsule [id number] Yes Take 1 capsule by mouth at bedtime Priscella Brooms, DO Taking Active   tirzepatide  (MOUNJARO ) 15 MG/0.5ML Pen [id number] Yes Inject 15 mg into the skin once a week. Thapa, [place], MD Taking Active Self           Med Note (SATTERFIELD, Ken Patty   Wed Mar 14, 2023  3:12 PM) Take on Wednesdays             Recommendation:   PCP and Specialty provider follow-up per MD requests  Follow Up Plan:   Telephone follow up appointment date/time:  05/25/23 11am  Tonia Frankel RN, CCM [place]  VBCI-Population Health RN Care Manager [phone]

## 2023-05-18 MED ORDER — OXYBUTYNIN CHLORIDE 5 MG PO TABS: 5.0000 mg | ORAL_TABLET | Freq: Three times a day (TID) | ORAL | 1 refills | Status: DC

## 2023-05-21 NOTE — Therapy (Signed)
 OUTPATIENT OCCUPATIONAL THERAPY ORTHO EVALUATION  Patient Name: [name] MRN: [id number] DOB:Sep 04, 1975, 48 y.o., male Today's Date: 05/22/2023  PCP: Dr. Wilnette Haste REFERRING PROVIDER: Cherylene Corrente, MD  END OF SESSION:  OT End of Session - 05/22/23 0944     Visit Number 1    Number of Visits 13    Date for OT Re-Evaluation 07/06/23    Authorization Type [employer] Medicare, Dual covered 100%    Authorization - Visit Number 1    Authorization - Number of Visits 10    Progress Note Due on Visit 10    OT Start Time 0845    OT Stop Time [phone]    OT Time Calculation (min) 53 min    Activity Tolerance Patient tolerated treatment well    Behavior During Therapy [facility] for tasks assessed/performed             Past Medical History:  Diagnosis Date   Acute venous embolism and thrombosis of deep vessels of proximal lower extremity (HCC) 07/19/2011   Anemia    Anginal pain (HCC)    pt denies   Anxiety    Chest pain, neg MI, normal coronaries by cath 02/18/2013   pt denies   CHF (congestive heart failure) (HCC)    Chronic renal disease, stage IV (HCC) 02/19/2013   Colon polyps    Depression    Diabetic ulcer of right foot (HCC)    DVT (deep venous thrombosis) (HCC) 09/2002   patient reports additional DVTs in '06 & '11 (unconfirmed)   ED (erectile dysfunction)    GERD (gastroesophageal reflux disease)    History of blood transfusion    "related to OR" (10/31/2016)   Hyperlipidemia 02/19/2013   Hypertension    Nausea & vomiting    "constant for the last couple weeks" (10/31/2016)   Nephrotic syndrome    Obesity    BMI 44, weight 346 pounds 01/30/14   OSA (obstructive sleep apnea) 02/28/2018   Mild obstructive sleep apnea with an AHI of 9.8/h but severe during REM sleep with an AHI of 33.8/h.  Oxygen  saturations dropped to 86% and there was moderate snoring   Peripheral edema    Pneumonia    Prosthesis adjustment 08/17/2016   Pulmonary embolism (HCC) 09/2002    treated with 6 months of warfarin   Pyelonephritis 02/02/2018   Type I diabetes mellitus (HCC) dx'd 2001   Urinary retention    Past Surgical History:  Procedure Laterality Date   AMPUTATION Right 12/22/2013   Procedure: AMPUTATION BELOW KNEE;  Surgeon: Amada Backer, MD;  Location: [facility] OR;  Service: Orthopedics;  Laterality: Right;   AMPUTATION Right 02/19/2014   Procedure: RIGHT ABOVE KNEE AMPUTATION ;  Surgeon: Amada Backer, MD;  Location: [facility] OR;  Service: Orthopedics;  Laterality: Right;   blood clot removal  2016   CARDIAC CATHETERIZATION  02/18/2013   normal coronaries   ESOPHAGOGASTRODUODENOSCOPY N/A 11/02/2016   Procedure: ESOPHAGOGASTRODUODENOSCOPY (EGD);  Surgeon: Kenney Peacemaker, MD;  Location: [facility] ENDOSCOPY;  Service: Endoscopy;  Laterality: N/A;   I & D EXTREMITY Right 12/14/2013   Procedure: IRRIGATION AND DEBRIDEMENT RIGHT FOOT;  Surgeon: Lorriane Rote, MD;  Location: [facility] OR;  Service: Orthopedics;  Laterality: Right;   INCISION AND DRAINAGE ABSCESS  2007; 2015   "back"   INCISION AND DRAINAGE OF WOUND Right 12/22/2013   Procedure: I&D RIGHT BUTTOCK;  Surgeon: Amada Backer, MD;  Location[facility] OR;  Service: Orthopedics;  Laterality: Right;   LEFT HEART CATHETERIZATION  WITH CORONARY ANGIOGRAM N/A 02/18/2013   Procedure: LEFT HEART CATHETERIZATION WITH CORONARY ANGIOGRAM;  Surgeon: Peter M Swaziland, MD;  Location: [facility] CATH LAB;  Service: Cardiovascular;  Laterality: N/A;   Patient Active Problem List   Diagnosis Date Noted   Tendinosis of right shoulder, possible supraspinatus tear 05/08/2023   Gout of right wrist 03/26/2023   Insomnia 03/26/2023   Volume overload 03/21/2023   Inflammatory arthropathy 02/23/2023   Pain and swelling of right wrist 02/22/2023   Medial epicondylitis of elbow, left 05/06/2022   Diabetic neuropathy (HCC) 01/24/2022   Trapezius strain, left, initial encounter 02/02/2021   Left shoulder pain 12/30/2020   Benign paroxysmal positional vertigo 09/14/2020    Anemia associated with chronic renal failure    Hemoglobinuria 12/12/2018   OSA (obstructive sleep apnea) 02/28/2018   Acute on chronic diastolic heart failure (HCC) 02/02/2018   Preventative health care 08/15/2017   Erectile dysfunction 04/22/2017   History of pulmonary embolus (PE) 06/09/2016   Depression 05/04/2016   Hypogonadism in male 05/04/2016   Urinary retention 05/04/2016   Dyslipidemia 08/12/2014   Unilateral AKA, right (HCC) 12/26/2013   Hyperlipidemia 02/19/2013   S/P cardiac cath, 02/18/13, normal coronaries 02/19/2013   CKD (chronic kidney disease) stage 4, GFR 15-29 ml/min (HCC) 02/19/2013   Hypertension 07/24/2011   Long term current use of anticoagulant therapy 07/19/2011   History of DVT (deep vein thrombosis) 07/28/2009   Morbid obesity (HCC) 10/26/2005   Insulin  dependent type 2 diabetes mellitus, controlled (HCC) 01/09/2002    ONSET DATE: 04/26/23  REFERRING DIAG: M79.601 (ICD-10-CM) - Right arm pain  THERAPY DIAG:  Acute pain of right shoulder  Muscle weakness (generalized)  Rationale for Evaluation and Treatment: Rehabilitation  SUBJECTIVE:   SUBJECTIVE STATEMENT: Pt reports that he has not been using prosthetic recently Pt accompanied by: self  PERTINENT HISTORY: patient admitted on 4/17-4/22 for right arm swelling and pain concern for gouty arthritis and septic joint given hx of gouty arthritis in past, negative for crystals on arthrocentesis, cultures negative so empiric abx stopped. MRI of R shoulder noted tendinosis and possible small supraspinatus tear with mild subacromial/subdeltoid bursitis. Orthopedics rec sling and patient has outpatient f/u with them.  PMH:  R AKA 2016, hx of anemia, anxiety, CHF, chronic kidney disease disease, depression, hx of DVT and PE, GERD, OSA, type 1 diabetes, obesity, HTN, dyslipidemia  PRECAUTIONS: Fall  WEIGHT BEARING RESTRICTIONS: No  PAIN:  Are you having pain? Yes: NPRS scale: 3/10 today, 0-3/10 Pain  location: top of shoulder  Pain description: achy/sore Aggravating factors: laying on it Relieving factors: topical gel, muscle relaxer  FALLS: Has patient fallen in last 6 months? No  LIVING ENVIRONMENT: Lives with: lives alone Stairs: No Has following equipment at home: Counselling psychologist, Environmental consultant - 2 wheeled, Wheelchair (manual), and Tour manager  PLOF: Independent with basic ADLs  PATIENT GOALS: get stronger, improve pain, and ROM  NEXT MD VISIT: not yet scheduled  OBJECTIVE:  Note: Objective measures were completed at Evaluation unless otherwise noted.  HAND DOMINANCE: Right  ADLs:   Uses prosthetic some with RW to ambulate, uses w/c most of the time Overall ADLs: Pt reports performing BADLs mod I, minimal pain with transfers/using walker.  Pt does have an aide that assists with some household tasks.   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 Pt enjoys reading, writing, work out, spend time with friends, cooking, outdoors, sporting events, fishing, axe throwing, dining out.  FUNCTIONAL OUTCOME MEASURES: Quick Dash: 9%  UPPER EXTREMITY ROM:   RUE grossly WNL, but pt notes some tightness at end ranges of shoulder movement   UPPER EXTREMITY MMT:     MMT Right eval Left eval  Shoulder flexion 5/5 5/5  Shoulder abduction 4+/5 5/5  Shoulder adduction 5/5 5/5  Shoulder extension    Shoulder internal rotation    Shoulder external rotation    Middle trapezius    Lower trapezius    Elbow flexion 5/5 5/5  Elbow extension 5/5 5/5  Wrist flexion    Wrist extension    Wrist ulnar deviation    Wrist radial deviation    Wrist pronation    Wrist supination    (Blank rows = not tested)  HAND FUNCTION: Grip strength: Right: 42 lbs; Left: 60 lbs  COORDINATION: WNL  SENSATION: Pt reports numbness/tingling in thumb and 2-3rd digits, but controlled with meds.  EDEMA: none noted   COGNITION: Overall cognitive status: Within  functional limits for tasks assessed  OBSERVATIONS: Pt very motivated for improvement.   TREATMENT DATE:   05/22/23:   Evaluation completed today.  See below for education details.                                                                                                                               PATIENT EDUCATION: Education details: OT Eval results/POC discussion; Recommended pt decr use of weights, particularly exercises that position shoulders in IR.  Instructed pt in initial stretching HEP for shoulder--see pt instructions Person educated: Patient Education method: Explanation, Demonstration, Verbal cues, and Handouts Education comprehension: verbalized understanding, returned demonstration, and verbal cues required  HOME EXERCISE PROGRAM: 05/22/23:  HEP for shoulder stretch  GOALS: Goals reviewed with patient? No  SHORT TERM GOALS: Target date: 06/22/23  Pt will be independent with initial HEP for R shoulder. Goal status: INITIAL  2.  Pt will be independent with HEP for R wrist stretching and strength. Goal status: INITIAL  3.  Pt will improve R grip strength to at least 6lbs to assist with ADLs/IADLs Baseline:  42lbs Goal status: INITIAL  4.  Pt will verbalize understanding of proper positioning of R shoulder and wrist to decr risk of injury. Goal status: INITIAL    LONG TERM GOALS: Target date: 07/06/23  Pt will be independent with updated HEP and appropriate community fitness activities. Baseline:  Goal status: INITIAL  2.  Pt will improve RUE functional use/pain as shown by improving Quick Dash to 4.5% or less Baseline: 9% Goal status: INITIAL  3.  Pt will improve R grip strength to at least 12lbs to assist with ADLs/IADLs Baseline:  42lbs Goal status: INITIAL    ASSESSMENT:  CLINICAL IMPRESSION: Patient is a 48 y.o. male who was seen today for occupational therapy evaluation for R arm pain.  Pt   hospitalized on 4/17-4/22/25 for right arm  swelling and pain with concern for gouty arthritis and  septic joint given hx of gouty arthritis in past, negative for crystals on arthrocentesis, cultures negative so empiric abx stopped. MRI of R shoulder noted tendinosis and possible small supraspinatus tear with mild subacromial/subdeltoid bursitis. Pt with PMH that also includes:  R AKA 2016, hx of anemia, anxiety, CHF, chronic kidney disease disease, depression, hx of DVT and PE, GERD, OSA, type 1 diabetes, obesity, HTN, dyslipidemia.  Pt requires UE use for w/c and walker mobility due to hx of R AKA.  Pt presents today with RUE pain and weakness and would benefit from occupational therapy to address these for incr dominant RUE functional use, improved quality of life.  PERFORMANCE DEFICITS: in functional skills including ADLs, IADLs, strength, pain, and UE functional use, , and psychosocial skills including habits.   IMPAIRMENTS: are limiting patient from ADLs, IADLs, and leisure.   COMORBIDITIES: may have co-morbidities  that affects occupational performance. Patient will benefit from skilled OT to address above impairments and improve overall function.  MODIFICATION OR ASSISTANCE TO COMPLETE EVALUATION: Min-Moderate modification of tasks or assist with assess necessary to complete an evaluation.  OT OCCUPATIONAL PROFILE AND HISTORY: Detailed assessment: Review of records and additional review of physical, cognitive, psychosocial history related to current functional performance.  CLINICAL DECISION MAKING: Moderate - several treatment options, min-mod task modification necessary  REHAB POTENTIAL: Good  EVALUATION COMPLEXITY: Low      PLAN:  OT FREQUENCY: 2x/week  OT DURATION: 6 weeks +eval  PLANNED INTERVENTIONS: 97535 self care/ADL training, 65784 therapeutic exercise, 97530 therapeutic activity, 97112 neuromuscular re-education, 97140 manual therapy, 97035 ultrasound, 97018 paraffin, 69629 fluidotherapy, 97010 moist heat, 97010  cryotherapy, passive range of motion, functional mobility training, patient/family education, and DME and/or AE instructions  RECOMMENDED OTHER SERVICES: none at this time  CONSULTED AND AGREED WITH PLAN OF CARE: Patient  PLAN FOR NEXT SESSION: review initial shoulder HEP and progress as able, ?ultrasound for pain, add wrist stretching and light wrist/hand strengthening, proper positioning of shoulder (pt also asks about kinesiotape use--consider for future session prn)   Madhavi Hamblen, OTR/L 05/22/2023, 11:41 AM

## 2023-05-22 ENCOUNTER — Encounter: Payer: Self-pay | Admitting: Occupational Therapy

## 2023-05-22 ENCOUNTER — Ambulatory Visit: Attending: Internal Medicine | Admitting: Occupational Therapy

## 2023-05-22 DIAGNOSIS — M25511 Pain in right shoulder: Secondary | ICD-10-CM | POA: Diagnosis not present

## 2023-05-22 DIAGNOSIS — M79601 Pain in right arm: Secondary | ICD-10-CM | POA: Diagnosis not present

## 2023-05-22 DIAGNOSIS — M6281 Muscle weakness (generalized): Secondary | ICD-10-CM | POA: Diagnosis not present

## 2023-05-22 DIAGNOSIS — R2681 Unsteadiness on feet: Secondary | ICD-10-CM | POA: Insufficient documentation

## 2023-05-22 NOTE — Patient Instructions (Signed)
   SELF ASSISTED WITH OBJECT: Shoulder Flexion - Sitting    Hold cane with both hands. Raise arms up and overhead (palms up) _10__ reps per set, _2__ sets per day     ROM: Abduction - Wand   Holding wand with left hand palm up, push wand directly out to side, leading with other hand palm down, until stretch is felt. Hold 5 seconds.  Don't raise shoulder.  Don't go above shoulder height. Repeat 10 times per set. Do 2 sessions per day.   SHOULDER: External Rotation    Sitting in doorway, place hand against wall. Keep elbow close to body. Lean forward. Hold 10 seconds.  Chest up. 5 reps per set, 2 sets per day

## 2023-05-24 ENCOUNTER — Encounter (HOSPITAL_COMMUNITY)

## 2023-05-25 ENCOUNTER — Encounter (HOSPITAL_COMMUNITY)
Admission: RE | Admit: 2023-05-25 | Discharge: 2023-05-25 | Disposition: A | Discharge status: Home / Self Care | Source: Ambulatory Visit | Attending: Nephrology | Admitting: Nephrology

## 2023-05-25 ENCOUNTER — Other Ambulatory Visit: Payer: Self-pay

## 2023-05-25 VITALS — BP 139/76 | HR 83 | Temp 97.7°F | Resp 16

## 2023-05-25 DIAGNOSIS — N184 Chronic kidney disease, stage 4 (severe): Secondary | ICD-10-CM

## 2023-05-25 LAB — RENAL FUNCTION PANEL
Albumin: 3.4 g/dL — ABNORMAL LOW (ref 3.5–5.0)
Anion gap: 11 (ref 5–15)
BUN: 63 mg/dL — ABNORMAL HIGH (ref 6–20)
CO2: 23 mmol/L (ref 22–32)
Calcium: 8.9 mg/dL (ref 8.9–10.3)
Chloride: 102 mmol/L (ref 98–111)
Creatinine, Ser: 3.17 mg/dL — ABNORMAL HIGH (ref 0.61–1.24)
GFR, Estimated: 24 mL/min — ABNORMAL LOW (ref >60)
Glucose, Bld: 73 mg/dL (ref 70–99)
Phosphorus: 4.5 mg/dL (ref 2.5–4.6)
Potassium: 3.9 mmol/L (ref 3.5–5.1)
Sodium: 136 mmol/L (ref 135–145)

## 2023-05-25 LAB — IRON AND TIBC
Iron: 48 ug/dL (ref 45–182)
Saturation Ratios: 16 % — ABNORMAL LOW (ref 17.9–39.5)
TIBC: 302 ug/dL (ref 250–450)
UIBC: 254 ug/dL

## 2023-05-25 LAB — POCT HEMOGLOBIN-HEMACUE: Hemoglobin: 9.9 g/dL — ABNORMAL LOW (ref 13.0–17.0)

## 2023-05-25 LAB — FERRITIN: Ferritin: 247 ng/mL (ref 24–336)

## 2023-05-25 MED ORDER — DARBEPOETIN ALFA 200 MCG/0.4ML IJ SOSY
PREFILLED_SYRINGE | INTRAMUSCULAR | Status: AC
  Filled 2023-05-25: qty 0.4

## 2023-05-25 MED ORDER — DARBEPOETIN ALFA 200 MCG/0.4ML IJ SOSY
200.0000 ug | PREFILLED_SYRINGE | INTRAMUSCULAR | Status: DC
  Administered 2023-05-25: 200 ug via SUBCUTANEOUS

## 2023-05-25 NOTE — Transitions of Care (Post Inpatient/ED Visit) (Signed)
 Transition of Care week 4  Visit Note  05/25/2023  Name: [name] MRN: [id number]          DOB: Jan 07, 1976  Situation: Patient enrolled in [facility] 30-day program. Visit completed with patient by telephone.   Background: Patient with admission 4/17 - 05/01/23 at [facility] for inflammatory arthropathy right shoulder. Patient reports doing well and was able to walk in graduation ceremony as planned. Patient feels things are going well and states he was at infusion center this morning and reports hemoglobin 9.9 and received Aranesp  - TOC RN reviewed results of today's labs and noted worsening kidney function and patient states he gets a call from [facility] with lab results and has not heard yet regarding today's results. TOC RN will discuss outcome of call/plan with next call with patient. Patient sates he will be taking classes this summer and will see how busy his schedule is and determine if he wants to move forward with loose skin removal of right stump.   Initial Transition Care Management Follow-up Telephone Call    Past Medical History:  Diagnosis Date   Acute venous embolism and thrombosis of deep vessels of proximal lower extremity (HCC) 07/19/2011   Anemia    Anginal pain (HCC)    pt denies   Anxiety    Chest pain, neg MI, normal coronaries by cath 02/18/2013   pt denies   CHF (congestive heart failure) (HCC)    Chronic renal disease, stage IV (HCC) 02/19/2013   Colon polyps    Depression    Diabetic ulcer of right foot (HCC)    DVT (deep venous thrombosis) (HCC) 09/2002   patient reports additional DVTs in '06 & '11 (unconfirmed)   ED (erectile dysfunction)    GERD (gastroesophageal reflux disease)    History of blood transfusion    "related to OR" (10/31/2016)   Hyperlipidemia 02/19/2013   Hypertension    Nausea & vomiting    "constant for the last couple weeks" (10/31/2016)   Nephrotic syndrome    Obesity    BMI 44, weight 346 pounds 01/30/14   OSA  (obstructive sleep apnea) 02/28/2018   Mild obstructive sleep apnea with an AHI of 9.8/h but severe during REM sleep with an AHI of 33.8/h.  Oxygen  saturations dropped to 86% and there was moderate snoring   Peripheral edema    Pneumonia    Prosthesis adjustment 08/17/2016   Pulmonary embolism (HCC) 09/2002   treated with 6 months of warfarin   Pyelonephritis 02/02/2018   Type I diabetes mellitus (HCC) dx'd 2001   Urinary retention     Assessment: Patient Reported Symptoms: Cognitive Cognitive Status: Alert and oriented to person, place, and time, Normal speech and language skills Cognitive/Intellectual Conditions Management [RPT]: None reported or documented in medical history or problem list      Neurological Neurological Review of Symptoms: No symptoms reported    HEENT HEENT Symptoms Reported: No symptoms reported      Cardiovascular Cardiovascular Symptoms Reported: No symptoms reported Cardiovascular Conditions: Hypertension (Patient reports BP was checked this morning at infusion center and states it was stable) Cardiovascular Self-Management Outcome: 4 (good)  Respiratory Respiratory Symptoms Reported: No symptoms reported    Endocrine Patient reports the following symptoms related to hypoglycemia or hyperglycemia : No symptoms reported Is patient diabetic?: Yes Is patient checking blood sugars at home?: Yes Endocrine Conditions: Diabetes Endocrine Self-Management Outcome: 4 (good)  Gastrointestinal Gastrointestinal Symptoms Reported: No symptoms reported  Genitourinary Genitourinary Symptoms Reported: No symptoms reported    Integumentary Additional Integumentary Details: Patient has he is right stump and has plan to discuss loose skin removal with Dr Julio Ohm after he starts his summer classes when he sees how busy he will be    Musculoskeletal          Psychosocial           There were no vitals filed for this visit.  Medications Reviewed Today      Reviewed by Sharmaine Dearth, RN (Registered Nurse) on 05/25/23 at 1105  Med List Status: <None>   Medication Order Taking? Sig Documenting Provider Last Dose Status Informant  acetaminophen  (TYLENOL ) 325 MG tablet [id number] Yes Take 2 tablets (650 mg total) by mouth every 6 (six) hours.  Patient taking differently: Take 650 mg by mouth every 6 (six) hours as needed for mild pain (pain score 1-3).   Aurora Lees, DO Taking Active Self  allopurinol  (ZYLOPRIM ) 100 MG tablet [id number] Yes Take 0.5 tablets (50 mg total) by mouth every other day. Sheree Dieter, MD Taking Active Self  amLODipine  (NORVASC ) 10 MG tablet [id number] Yes Take 10 mg by mouth at bedtime. [provider] Taking Active Self  carvedilol  (COREG ) 25 MG tablet [id number] Yes TAKE 1 TABLET BY MOUTH TWICE DAILY WITH A MEAL Amoako, Prince, MD Taking Active Self  cloNIDine  (CATAPRES  - DOSED IN MG/24 HR) 0.1 mg/24hr patch [id number] Yes Place 0.1 mg onto the skin once a week. Thursdays [provider] Taking Active Self           Med Note Baltazar Leventhal, [place]   Fri Apr 27, 2023  3:14 PM)    Continuous Glucose Receiver (FREESTYLE LIBRE 2 READER) DEVI [id number] Yes Use with sensors to monitor blood glucose Thapa, [place], MD Taking Active   Continuous Glucose Receiver (FREESTYLE LIBRE 2 READER) DEVI [id number] Yes 1 each by Does not apply route daily. Thapa, [place], MD Taking Active   Continuous Glucose Sensor (FREESTYLE LIBRE 2 SENSOR) [place] [id number] Yes Change every 14 days as directed by manufacturer Thapa, [place], MD Taking Active   diclofenac  Sodium (VOLTAREN ) 1 % GEL [id number] Yes Apply 2 g topically 4 (four) times daily.  Patient taking differently: Apply 2 g topically daily as needed (for pain).   Freada Jacobs, MD Taking Active Self  ELIQUIS  5 MG TABS tablet [id number] Yes Take 1 tablet by mouth twice daily Priscella Brooms, DO Taking Active Self           Med Note (SATTERFIELD, DARIUS E   Sun Apr 29, 2023  8:47  PM) @1800   fluticasone  (FLONASE ) 50 MCG/ACT nasal spray [id number] Yes Place 2 sprays into both nostrils daily.  Patient taking differently: Place 2 sprays into both nostrils daily as needed for allergies.   Freada Jacobs, MD Taking Active Self  furosemide  (LASIX ) 80 MG tablet [id number] Yes Take 1 tablet (80 mg total) by mouth 2 (two) times daily.  Patient taking differently: Take 100 mg by mouth 2 (two) times daily. Patient states he saw [facility] 05/17/23 and states this was increased to 100mg  2x/day   Aurora Lees, DO Taking Active Self           Med Note [facility], Damari Hiltz A   Tue Mar 27, 2023  9:40 AM) Patient reports 03/22/23 nephrology instructed patient to take 80mg  M,W,F 3x/day; and all other days 80mg  2x/day  gabapentin  (NEURONTIN ) 100 MG capsule [id number] Yes Take 100mg  in the morning, 100mg  in the  afternoon, and 200mg  at night  Patient taking differently: Take 100-200 mg by mouth 3 (three) times daily. Take 100mg  by mouth in the morning, then take 100 mg by mouth in the afternoon, and 200 mg by mouth at night per patient   Sheree Dieter, MD Taking Active Self           Med Note Baltazar Leventhal, [place]   Fri Apr 27, 2023  3:15 PM)    hydrALAZINE  (APRESOLINE ) 100 MG tablet [id number] Yes Take 100 mg by mouth 3 (three) times daily. [provider] Taking Active Self  insulin  degludec (TRESIBA  FLEXTOUCH) 200 UNIT/ML FlexTouch Pen [id number] Yes Inject 36 Units into the skin daily.  Patient taking differently: Inject 35 Units into the skin daily.   Jayson Michael, MD Taking Active   Insulin  Pen Needle 32G X 4 MM MISC [id number] Yes Use as directed  Taking Active   lidocaine  (HM LIDOCAINE  PATCH) 4 % [id number] No Place 1 patch onto the skin daily.  Patient not taking: Reported on 05/17/2023   True Fuss, MD Not Taking Active Self  lovastatin  (MEVACOR ) 20 MG tablet [id number] Yes Take 1 tablet by mouth once daily Priscella Brooms, DO Taking Active   NOVOLOG  FLEXPEN 100 UNIT/ML  FlexPen [id number] Yes Inject 0-10 Units into the skin 3 (three) times daily with meals. Glucose 141-180: inject 4 units, Glucose 181-220: inject 6 units, Glucose 221-260: inject 8 units, Glucose 261-300: inject 10 units, If glucose if greater than 300, please inject 12 units and call your PCP and/or endocrinologist office Aurora Lees, DO Taking Active Self  oxybutynin  (DITROPAN ) 5 MG tablet [id number] Yes Take 1 tablet (5 mg total) by mouth 3 (three) times daily. Atway, Rayann N, DO Taking Active   pantoprazole  (PROTONIX ) 20 MG tablet [id number] No Take 1 tablet (20 mg total) by mouth daily.  Patient not taking: Reported on 05/17/2023   Aurora Lees, DO Not Taking Active Self           Med Note [facility], Marshay Slates A   Mon Mar 19, 2023 10:00 AM) Patient states he does not have this medication and states he does not have reflux  tamsulosin  (FLOMAX ) 0.4 MG CAPS capsule [id number] Yes Take 1 capsule by mouth at bedtime Priscella Brooms, DO Taking Active   tirzepatide  (MOUNJARO ) 15 MG/0.5ML Pen [id number] Yes Inject 15 mg into the skin once a week. Thapa, [place], MD Taking Active Self           Med Note (SATTERFIELD, DARIUS E   Wed Mar 14, 2023  3:12 PM) Take on Wednesdays             Recommendation:   Patient to discuss 05/25/23 labs with [facility]   Follow Up Plan:   Telephone follow up appointment date/time:  06/01/23 11am  Tonia Frankel RN, CCM [place]  VBCI-Population Health RN Care Manager [phone]

## 2023-05-29 ENCOUNTER — Telehealth: Payer: Self-pay | Admitting: Occupational Therapy

## 2023-05-29 ENCOUNTER — Other Ambulatory Visit: Payer: Self-pay | Admitting: Student

## 2023-05-29 ENCOUNTER — Ambulatory Visit: Admitting: Occupational Therapy

## 2023-05-29 DIAGNOSIS — E119 Type 2 diabetes mellitus without complications: Secondary | ICD-10-CM

## 2023-05-29 NOTE — Telephone Encounter (Signed)
 This is to document my attempt to call patient after no-show for OT appt this AM.  This is patient's # 1st missed appt following his evaluation.   Primary phone number(s) was used in efforts to contact the patient.   Voice mail was left requesting the patient call the clinic back at [phone] to confirm upcoming appointment - next Tuesday also at 8 AM.

## 2023-06-01 ENCOUNTER — Telehealth: Payer: Self-pay

## 2023-06-01 DIAGNOSIS — E1122 Type 2 diabetes mellitus with diabetic chronic kidney disease: Secondary | ICD-10-CM

## 2023-06-01 NOTE — Transitions of Care (Post Inpatient/ED Visit) (Signed)
  Transition of Care Week 5 case closure  Visit Note  06/01/2023  Name: [name] MRN: [id number]          DOB: July 12, 1975  Situation: Patient enrolled in [facility] 30-day program. Visit completed with patient by telephone.   Background: Patient with admission 4/17 - 05/01/23 at [facility] for inflammatory arthropathy right shoulder. Patient doing well and has family present and has kids making loud noise. Patient made multiple attempts to quiet them but was hard to hear - call kept short - Patient declined medication review stating there were no changes and agrees to [facility] program closure with referral to CCM.   Initial Transition Care Management Follow-up Telephone Call    Past Medical History:  Diagnosis Date   Acute venous embolism and thrombosis of deep vessels of proximal lower extremity (HCC) 07/19/2011   Anemia    Anginal pain (HCC)    pt denies   Anxiety    Chest pain, neg MI, normal coronaries by cath 02/18/2013   pt denies   CHF (congestive heart failure) (HCC)    Chronic renal disease, stage IV (HCC) 02/19/2013   Colon polyps    Depression    Diabetic ulcer of right foot (HCC)    DVT (deep venous thrombosis) (HCC) 09/2002   patient reports additional DVTs in '06 & '11 (unconfirmed)   ED (erectile dysfunction)    GERD (gastroesophageal reflux disease)    History of blood transfusion    "related to OR" (10/31/2016)   Hyperlipidemia 02/19/2013   Hypertension    Nausea & vomiting    "constant for the last couple weeks" (10/31/2016)   Nephrotic syndrome    Obesity    BMI 44, weight 346 pounds 01/30/14   OSA (obstructive sleep apnea) 02/28/2018   Mild obstructive sleep apnea with an AHI of 9.8/h but severe during REM sleep with an AHI of 33.8/h.  Oxygen  saturations dropped to 86% and there was moderate snoring   Peripheral edema    Pneumonia    Prosthesis adjustment 08/17/2016   Pulmonary embolism (HCC) 09/2002   treated with 6 months of warfarin   Pyelonephritis  02/02/2018   Type I diabetes mellitus (HCC) dx'd 2001   Urinary retention    Patient denies any issues/concerns and states he was a no show for OT related to a transportation issue and will discuss this at his appointment next week     Recommendation:   Referral to: CCM completed 06/01/23  Follow Up Plan:   Closing From:  Transitions of Care Program  Tonia Frankel RN, CCM [facility]  VBCI-Population Health RN Care Manager [phone]

## 2023-06-01 NOTE — Transitions of Care (Post Inpatient/ED Visit) (Deleted)
 Care Management  Transitions of Care Program Transitions of Care Post-discharge Week #5  06/01/2023 Name: Dai Mcadams MRN: [id number] DOB: 12-Aug-1975  Subjective: Henery Betzold is a 48 y.o. year old male who is a primary care patient of Amoako, Prince, MD. The Care Management team was unable to reach the patient by phone to assess and address transitions of care needs.   Plan: Additional outreach attempts will be made to reach the patient enrolled in the [facility] Program (Post Inpatient/ED Visit).  Tonia Frankel RN, CCM [place]  VBCI-Population Health RN Care Manager [phone]

## 2023-06-05 ENCOUNTER — Encounter: Payer: Self-pay | Admitting: Occupational Therapy

## 2023-06-05 ENCOUNTER — Ambulatory Visit: Admitting: Occupational Therapy

## 2023-06-05 DIAGNOSIS — M79601 Pain in right arm: Secondary | ICD-10-CM | POA: Diagnosis not present

## 2023-06-05 DIAGNOSIS — R2681 Unsteadiness on feet: Secondary | ICD-10-CM | POA: Diagnosis not present

## 2023-06-05 DIAGNOSIS — M6281 Muscle weakness (generalized): Secondary | ICD-10-CM

## 2023-06-05 DIAGNOSIS — M25511 Pain in right shoulder: Secondary | ICD-10-CM | POA: Diagnosis not present

## 2023-06-05 NOTE — Patient Instructions (Signed)
 Extension (Passive)    Using other hand, lift hand at wrist as far as possible. Hold __10__ seconds. Repeat _5___ times. Do __3__ sessions per day.  Wrist Extension: Isometric    With left forearm resting palm down on thigh, resist upward movement of hand with other hand. Hold __10__ seconds. Relax. Repeat __5__ times per set. Do __3__ sets per session.   Flexion (Isometric)    With forearm held steady, palm up, use other hand to resist upward movement of hand at wrist. Hold __10__ seconds. Relax. Repeat __5__ times. Do __3__ sessions per day.   (Clinic) Retraction: Row - Bilateral (Pulley)    Facing pulley, arms reaching forward, pull hands toward stomach, pinching shoulder blades together. Repeat _10___ times per set. Do _2___ sets per day, every other day   Strengthening: Resisted Extension   Hold tubing in ___BOTH__ hand(s), arm forward. Pull arm back, elbow straight. Repeat _10___ times per set. Do _1-2___ sessions per day, every other day.   Resisted Horizontal Abduction: Bilateral   Sit or stand, tubing in both hands, arms out in front. Keeping arms straight, pinch shoulder blades together and stretch arms out. Repeat _10___ times per set. Do _1-2___ sessions per day, every other day.

## 2023-06-05 NOTE — Therapy (Signed)
 OUTPATIENT OCCUPATIONAL THERAPY ORTHO TREATMENT  Patient Name: [name] MRN: [id number] DOB:1975/07/04, 48 y.o., male Today's Date: 06/05/2023  PCP: Dr. Wilnette Haste REFERRING PROVIDER: Cherylene Corrente, MD  END OF SESSION:  OT End of Session - 06/05/23 0803     Visit Number 2    Number of Visits 13    Date for OT Re-Evaluation 07/06/23    Authorization Type [employer] Medicare, Dual covered 100%    Authorization - Number of Visits 10    Progress Note Due on Visit 10    OT Start Time 0801    OT Stop Time 0845   10 minutes of hot pack - unbillable   OT Time Calculation (min) 44 min    Activity Tolerance Patient limited by lethargy    Behavior During Therapy Flat affect   pt could not stay awake            Past Medical History:  Diagnosis Date   Acute venous embolism and thrombosis of deep vessels of proximal lower extremity (HCC) 07/19/2011   Anemia    Anginal pain (HCC)    pt denies   Anxiety    Chest pain, neg MI, normal coronaries by cath 02/18/2013   pt denies   CHF (congestive heart failure) (HCC)    Chronic renal disease, stage IV (HCC) 02/19/2013   Colon polyps    Depression    Diabetic ulcer of right foot (HCC)    DVT (deep venous thrombosis) (HCC) 09/2002   patient reports additional DVTs in '06 & '11 (unconfirmed)   ED (erectile dysfunction)    GERD (gastroesophageal reflux disease)    History of blood transfusion    "related to OR" (10/31/2016)   Hyperlipidemia 02/19/2013   Hypertension    Nausea & vomiting    "constant for the last couple weeks" (10/31/2016)   Nephrotic syndrome    Obesity    BMI 44, weight 346 pounds 01/30/14   OSA (obstructive sleep apnea) 02/28/2018   Mild obstructive sleep apnea with an AHI of 9.8/h but severe during REM sleep with an AHI of 33.8/h.  Oxygen  saturations dropped to 86% and there was moderate snoring   Peripheral edema    Pneumonia    Prosthesis adjustment 08/17/2016   Pulmonary embolism (HCC) 09/2002    treated with 6 months of warfarin   Pyelonephritis 02/02/2018   Type I diabetes mellitus (HCC) dx'd 2001   Urinary retention    Past Surgical History:  Procedure Laterality Date   AMPUTATION Right 12/22/2013   Procedure: AMPUTATION BELOW KNEE;  Surgeon: Amada Backer, MD;  Location: [facility] OR;  Service: Orthopedics;  Laterality: Right;   AMPUTATION Right 02/19/2014   Procedure: RIGHT ABOVE KNEE AMPUTATION ;  Surgeon: Amada Backer, MD;  Location: [facility] OR;  Service: Orthopedics;  Laterality: Right;   blood clot removal  2016   CARDIAC CATHETERIZATION  02/18/2013   normal coronaries   ESOPHAGOGASTRODUODENOSCOPY N/A 11/02/2016   Procedure: ESOPHAGOGASTRODUODENOSCOPY (EGD);  Surgeon: Kenney Peacemaker, MD;  Location: [facility] ENDOSCOPY;  Service: Endoscopy;  Laterality: N/A;   I & D EXTREMITY Right 12/14/2013   Procedure: IRRIGATION AND DEBRIDEMENT RIGHT FOOT;  Surgeon: Lorriane Rote, MD;  Location: [facility] OR;  Service: Orthopedics;  Laterality: Right;   INCISION AND DRAINAGE ABSCESS  2007; 2015   "back"   INCISION AND DRAINAGE OF WOUND Right 12/22/2013   Procedure: I&D RIGHT BUTTOCK;  Surgeon: Amada Backer, MD;  Location[facility] OR;  Service: Orthopedics;  Laterality: Right;  LEFT HEART CATHETERIZATION WITH CORONARY ANGIOGRAM N/A 02/18/2013   Procedure: LEFT HEART CATHETERIZATION WITH CORONARY ANGIOGRAM;  Surgeon: Peter M Swaziland, MD;  Location: [facility] CATH LAB;  Service: Cardiovascular;  Laterality: N/A;   Patient Active Problem List   Diagnosis Date Noted   Tendinosis of right shoulder, possible supraspinatus tear 05/08/2023   Gout of right wrist 03/26/2023   Insomnia 03/26/2023   Volume overload 03/21/2023   Inflammatory arthropathy 02/23/2023   Pain and swelling of right wrist 02/22/2023   Medial epicondylitis of elbow, left 05/06/2022   Diabetic neuropathy (HCC) 01/24/2022   Trapezius strain, left, initial encounter 02/02/2021   Left shoulder pain 12/30/2020   Benign paroxysmal positional vertigo 09/14/2020    Anemia associated with chronic renal failure    Hemoglobinuria 12/12/2018   OSA (obstructive sleep apnea) 02/28/2018   Acute on chronic diastolic heart failure (HCC) 02/02/2018   Preventative health care 08/15/2017   Erectile dysfunction 04/22/2017   History of pulmonary embolus (PE) 06/09/2016   Depression 05/04/2016   Hypogonadism in male 05/04/2016   Urinary retention 05/04/2016   Dyslipidemia 08/12/2014   Unilateral AKA, right (HCC) 12/26/2013   Hyperlipidemia 02/19/2013   S/P cardiac cath, 02/18/13, normal coronaries 02/19/2013   CKD (chronic kidney disease) stage 4, GFR 15-29 ml/min (HCC) 02/19/2013   Hypertension 07/24/2011   Long term current use of anticoagulant therapy 07/19/2011   History of DVT (deep vein thrombosis) 07/28/2009   Morbid obesity (HCC) 10/26/2005   Insulin  dependent type 2 diabetes mellitus, controlled (HCC) 01/09/2002    ONSET DATE: 04/26/23  REFERRING DIAG: M79.601 (ICD-10-CM) - Right arm pain  THERAPY DIAG:  Acute pain of right shoulder  Muscle weakness (generalized)  Unsteadiness on feet  Rationale for Evaluation and Treatment: Rehabilitation  SUBJECTIVE:   SUBJECTIVE STATEMENT: Pain 2/10 Rt shoulder Pt accompanied by: self  PERTINENT HISTORY: patient admitted on 4/17-4/22 for right arm swelling and pain concern for gouty arthritis and septic joint given hx of gouty arthritis in past, negative for crystals on arthrocentesis, cultures negative so empiric abx stopped. MRI of R shoulder noted tendinosis and possible small supraspinatus tear with mild subacromial/subdeltoid bursitis. Orthopedics rec sling and patient has outpatient f/u with them.  PMH:  R AKA 2016, hx of anemia, anxiety, CHF, chronic kidney disease disease, depression, hx of DVT and PE, GERD, OSA, type 1 diabetes, obesity, HTN, dyslipidemia  PRECAUTIONS: Fall  WEIGHT BEARING RESTRICTIONS: No  PAIN:  Are you having pain? Yes: NPRS scale: 3/10 today, 0-3/10 Pain location: top  of shoulder  Pain description: achy/sore Aggravating factors: laying on it Relieving factors: topical gel, muscle relaxer  FALLS: Has patient fallen in last 6 months? No  LIVING ENVIRONMENT: Lives with: lives alone Stairs: No Has following equipment at home: Counselling psychologist, Environmental consultant - 2 wheeled, Wheelchair (manual), and Tour manager  PLOF: Independent with basic ADLs  PATIENT GOALS: get stronger, improve pain, and ROM  NEXT MD VISIT: not yet scheduled  OBJECTIVE:  Note: Objective measures were completed at Evaluation unless otherwise noted.  HAND DOMINANCE: Right  ADLs:   Uses prosthetic some with RW to ambulate, uses w/c most of the time Overall ADLs: Pt reports performing BADLs mod I, minimal pain with transfers/using walker.  Pt does have an aide that assists with some household tasks.   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 Pt enjoys reading, writing, work out, spend time with friends, cooking, outdoors, sporting events, fishing, axe throwing, dining out.  FUNCTIONAL OUTCOME MEASURES: Quick Dash: 9%  UPPER EXTREMITY ROM:   RUE grossly WNL, but pt notes some tightness at end ranges of shoulder movement   UPPER EXTREMITY MMT:     MMT Right eval Left eval  Shoulder flexion 5/5 5/5  Shoulder abduction 4+/5 5/5  Shoulder adduction 5/5 5/5  Shoulder extension    Shoulder internal rotation    Shoulder external rotation    Middle trapezius    Lower trapezius    Elbow flexion 5/5 5/5  Elbow extension 5/5 5/5  Wrist flexion    Wrist extension    Wrist ulnar deviation    Wrist radial deviation    Wrist pronation    Wrist supination    (Blank rows = not tested)  HAND FUNCTION: Grip strength: Right: 42 lbs; Left: 60 lbs  COORDINATION: WNL  SENSATION: Pt reports numbness/tingling in thumb and 2-3rd digits, but controlled with meds.  EDEMA: none noted   COGNITION: Overall cognitive status: Within functional  limits for tasks assessed  OBSERVATIONS: Pt very motivated for improvement.   TREATMENT DATE:   06/05/23:     Pt falling asleep t/o session and cues needed to stay awake. Pt reports he did not get a lot of sleep last night (waking up at midnight). Pt denies any other symptoms and does not appear to be in any kind of distress. BP = 115/58, HR = 81  Attempted to review previous ex's however unsure if pt is doing them.   Pt issued wrist stretch, isometric wrist ex's, and 3 theraband ex's to work posterior sh girdle to maintain sh integrity as pt self propels w/c t/o day working anterior sh - see pt instructions for details.  Pt instructed not to do same days he is working out arms at [employer]                                                                                                                            Hot pack applied to Rt shoulder last 10 minutes of session as pt unable to stay awake    PATIENT EDUCATION: Education details: wrist HEP, theraband HEP  Person educated: Patient Education method: Explanation, Demonstration, Verbal cues, and Handouts Education comprehension: verbalized understanding, returned demonstration, verbal cues required, and needs further education  HOME EXERCISE PROGRAM: 05/22/23:  HEP for shoulder stretch 06/05/23: Wrist HEP, theraband HEP   GOALS: Goals reviewed with patient? No  SHORT TERM GOALS: Target date: 06/22/23  Pt will be independent with initial HEP for R shoulder. Goal status: IN PROGRESS  2.  Pt will be independent with HEP for R wrist stretching and strength. Goal status: IN PROGRESS  3.  Pt will improve R grip strength to at least 6lbs to assist with ADLs/IADLs Baseline:  42lbs Goal status: INITIAL  4.  Pt will verbalize understanding of proper positioning of R shoulder and wrist to decr risk of injury. Goal status: INITIAL    LONG TERM  GOALS: Target date: 07/06/23  Pt will be independent with updated HEP and appropriate  community fitness activities. Baseline:  Goal status: INITIAL  2.  Pt will improve RUE functional use/pain as shown by improving Quick Dash to 4.5% or less Baseline: 9% Goal status: INITIAL  3.  Pt will improve R grip strength to at least 12lbs to assist with ADLs/IADLs Baseline:  42lbs Goal status: INITIAL    ASSESSMENT:  CLINICAL IMPRESSION: Patient seen today for occupational therapy treatment for R arm pain.  Pt   hospitalized on 4/17-4/22/25 for right arm swelling and pain with concern for gouty arthritis and septic joint given hx of gouty arthritis in past, negative for crystals on arthrocentesis, cultures negative so empiric abx stopped. MRI of R shoulder noted tendinosis and possible small supraspinatus tear with mild subacromial/subdeltoid bursitis. Pt with PMH that also includes:  R AKA 2016, hx of anemia, anxiety, CHF, chronic kidney disease disease, depression, hx of DVT and PE, GERD, OSA, type 1 diabetes, obesity, HTN, dyslipidemia.  Pt requires UE use for w/c and walker mobility due to hx of R AKA.  Pt presents today with RUE pain and weakness and would benefit from continued occupational therapy to address these for incr dominant RUE functional use, improved quality of life.  PERFORMANCE DEFICITS: in functional skills including ADLs, IADLs, strength, pain, and UE functional use, , and psychosocial skills including habits.   IMPAIRMENTS: are limiting patient from ADLs, IADLs, and leisure.   COMORBIDITIES: may have co-morbidities  that affects occupational performance. Patient will benefit from skilled OT to address above impairments and improve overall function.  MODIFICATION OR ASSISTANCE TO COMPLETE EVALUATION: Min-Moderate modification of tasks or assist with assess necessary to complete an evaluation.  OT OCCUPATIONAL PROFILE AND HISTORY: Detailed assessment: Review of records and additional review of physical, cognitive, psychosocial history related to current functional  performance.  CLINICAL DECISION MAKING: Moderate - several treatment options, min-mod task modification necessary  REHAB POTENTIAL: Good  EVALUATION COMPLEXITY: Low      PLAN:  OT FREQUENCY: 2x/week  OT DURATION: 6 weeks +eval  PLANNED INTERVENTIONS: 97535 self care/ADL training, 16109 therapeutic exercise, 97530 therapeutic activity, 97112 neuromuscular re-education, 97140 manual therapy, 97035 ultrasound, 97018 paraffin, 60454 fluidotherapy, 97010 moist heat, 97010 cryotherapy, passive range of motion, functional mobility training, patient/family education, and DME and/or AE instructions  RECOMMENDED OTHER SERVICES: none at this time  CONSULTED AND AGREED WITH PLAN OF CARE: Patient  PLAN FOR NEXT SESSION: review previous shoulder and wrist HEP, consider taping shoulder and/or US  for pain management, work on Rt grip strength   Velinda Getting, OTR/L 06/05/2023, 8:39 AM

## 2023-06-06 ENCOUNTER — Other Ambulatory Visit: Payer: Self-pay

## 2023-06-06 NOTE — Patient Instructions (Signed)
 Visit Information  Thank you for taking time to visit with me today. Please don't hesitate to contact me if I can be of assistance to you before our next scheduled appointment.  Our next appointment is by telephone on 06/20/23 at 3:30 PM Please call the care guide team at [phone] if you need to cancel or reschedule your appointment.   Following is a copy of your care plan:   Goals Addressed             This Visit's Progress    VBCI RN Care Plan related to CHF   On track    Problems:  Chronic Disease Management support and education needs related to CHF  Goal: Over the next 30 days the Patient will attend all scheduled medical appointments: PCP 06/11/23 as evidenced by completed visit notes uploaded to EMR        demonstrate Ongoing adherence to prescribed treatment plan for CHF as evidenced by patient report of no swelling take all medications exactly as prescribed and will call provider for medication related questions as evidenced by patient report of medication adherence    Patient will obtain BP cuff and begin checking regularly at home  Interventions:   Heart Failure Interventions: Reviewed role of diuretics in prevention of fluid overload and management of heart failure; Discussed the importance of keeping all appointments with provider Provided patient with education about the role of exercise in the management of heart failure  Patient Self-Care Activities:  Attend all scheduled provider appointments Call provider office for new concerns or questions  Take medications as prescribed   watch for swelling in feet, ankles and legs every day  Plan:  Telephone follow up appointment with care management team member scheduled for:  06/20/23 at 3:30 PM          VBCI RN Care Plan related to gout   On track    Problems:  Chronic Disease Management support and education needs related to gout  Goal: Over the next 30 days the Patient will attend all scheduled medical  appointments: PCP 06/11/23 as evidenced by completed visit notes uploaded to EMR        not experience hospital admission as evidenced by review of electronic medical record. Hospital Admissions in last 6 months = 3 take all medications exactly as prescribed and will call provider for medication related questions as evidenced by patient report of medication adherence     Interventions:   Evaluation of current treatment plan related to Gout,  self-management and patient's adherence to plan as established by provider. Discussed plans with patient for ongoing care management follow up and provided patient with direct contact information for care management team Evaluation of current treatment plan related to gout and patient's adherence to plan as established by provider Reviewed medications with patient and discussed signs and symptoms of gout  Patient Self-Care Activities:  Attend all scheduled provider appointments Call provider office for new concerns or questions  Take medications as prescribed    Plan:  Telephone follow up appointment with care management team member scheduled for:  06/20/23 at 3:30 PM             Please call the Suicide and Crisis Lifeline: 988 call 1-800-273-TALK (toll free, 24 hour hotline) if you are experiencing a Mental Health or Behavioral Health Crisis or need someone to talk to.  Patient verbalizes understanding of instructions and care plan provided today and agrees to view in MyChart. Active MyChart status and patient  understanding of how to access instructions and care plan via MyChart confirmed with patient.     Theodora Fish, RN MSN [place]  VBCI Population Health RN Care Manager Direct Dial: [phone]  Fax: [phone]

## 2023-06-06 NOTE — Patient Outreach (Signed)
 Complex Care Management   Visit Note  06/06/2023  Name:  [name] MRN: [id number] DOB: April 15, 1975  Situation: Referral received for Complex Care Management related to Heart Failure and gout I obtained verbal consent from Patient.  Visit completed with Yousef Nipper  on the phone  Background:   Past Medical History:  Diagnosis Date   Acute venous embolism and thrombosis of deep vessels of proximal lower extremity (HCC) 07/19/2011   Anemia    Anginal pain (HCC)    pt denies   Anxiety    Chest pain, neg MI, normal coronaries by cath 02/18/2013   pt denies   CHF (congestive heart failure) (HCC)    Chronic renal disease, stage IV (HCC) 02/19/2013   Colon polyps    Depression    Diabetic ulcer of right foot (HCC)    DVT (deep venous thrombosis) (HCC) 09/2002   patient reports additional DVTs in '06 & '11 (unconfirmed)   ED (erectile dysfunction)    GERD (gastroesophageal reflux disease)    History of blood transfusion    "related to OR" (10/31/2016)   Hyperlipidemia 02/19/2013   Hypertension    Nausea & vomiting    "constant for the last couple weeks" (10/31/2016)   Nephrotic syndrome    Obesity    BMI 44, weight 346 pounds 01/30/14   OSA (obstructive sleep apnea) 02/28/2018   Mild obstructive sleep apnea with an AHI of 9.8/h but severe during REM sleep with an AHI of 33.8/h.  Oxygen  saturations dropped to 86% and there was moderate snoring   Peripheral edema    Pneumonia    Prosthesis adjustment 08/17/2016   Pulmonary embolism (HCC) 09/2002   treated with 6 months of warfarin   Pyelonephritis 02/02/2018   Type I diabetes mellitus (HCC) dx'd 2001   Urinary retention     Assessment: Patient Reported Symptoms:  Cognitive Cognitive Status: Able to follow simple commands, Alert and oriented to person, place, and time, Normal speech and language skills Cognitive/Intellectual Conditions Management [RPT]: None reported or documented in medical history or problem list    Health Maintenance Behaviors: Annual physical exam, Exercise  Neurological Neurological Review of Symptoms: No symptoms reported    HEENT HEENT Symptoms Reported: No symptoms reported      Cardiovascular Cardiovascular Symptoms Reported: No symptoms reported Does patient have uncontrolled Hypertension?: No Cardiovascular Conditions: Hypertension, Heart failure Cardiovascular Management Strategies: Medication therapy Cardiovascular Comment: Patient has ordered BP cuff and reports that it is one it's way.  Respiratory Respiratory Symptoms Reported: No symptoms reported    Endocrine Patient reports the following symptoms related to hypoglycemia or hyperglycemia : Weakness or fatigue (Fatigue this morning due to blood sugar dropping to 69 while at the gym. Patient typically eats an apple when sugar drops.) Is patient diabetic?: Yes Is patient checking blood sugars at home?: Yes Endocrine Conditions: Diabetes Endocrine Management Strategies: Medication therapy Endocrine Comment: Patient sees Dr. Aretha Kubas at [facility].  Gastrointestinal Gastrointestinal Symptoms Reported: No symptoms reported (Last BM yesterday) Gastrointestinal Management Strategies: Medication therapy Gastrointestinal Comment: Patient reports appeite is up and down. Nutrition Risk Screen (CP): No indicators present  Genitourinary Genitourinary Symptoms Reported: No symptoms reported Genitourinary Comment: Patient follows with [facility] monthly for infusions.  Integumentary Integumentary Symptoms Reported: No symptoms reported Additional Integumentary Details: Patient reports nothing planned yet for loose skin removal on R stump. 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    Musculoskeletal Musculoskelatal Symptoms Reviewed: Other Other Musculoskeletal  Symptoms: Soreness in R wrist and arm. Denies swelling. Musculoskeletal Conditions: Amputation, Other (R AKA with  prosthesis) Other Musculoskeletal Conditions: Gout Musculoskeletal Management Strategies: Medication therapy, Medical device, Adequate rest, Exercise Musculoskeletal Comment: Patient is working with outpatient OT twice a week. Falls in the past year?: No Number of falls in past year: 1 or less Was there an injury with Fall?: No Fall Risk Category Calculator: 0 Patient Fall Risk Level: Low Fall Risk Patient at Risk for Falls Due to: Orthopedic patient (R AKA) Fall risk Follow up: Falls evaluation completed, Education provided  Psychosocial Psychosocial Symptoms Reported: No symptoms reported     Quality of Family Relationships: helpful, involved, supportive Do you feel physically threatened by others?: No      06/06/2023    4:07 PM  Depression screen PHQ 2/9  Decreased Interest 0  Down, Depressed, Hopeless 0  PHQ - 2 Score 0    There were no vitals filed for this visit.  Medications Reviewed Today     Reviewed by Valaria Garland, RN (Registered Nurse) on 06/06/23 at 1541  Med List Status: <None>   Medication Order Taking? Sig Documenting Provider Last Dose Status Informant  acetaminophen  (TYLENOL ) 325 MG tablet [id number] Yes Take 2 tablets (650 mg total) by mouth every 6 (six) hours.  Patient taking differently: Take 650 mg by mouth every 6 (six) hours as needed for mild pain (pain score 1-3).   Aurora Lees, DO Taking Active Self  allopurinol  (ZYLOPRIM ) 100 MG tablet [id number] Yes Take 0.5 tablets (50 mg total) by mouth every other day. Sheree Dieter, MD Taking Active Self  amLODipine  (NORVASC ) 10 MG tablet [id number] Yes Take 10 mg by mouth at bedtime. [provider] Taking Active Self  carvedilol  (COREG ) 25 MG tablet [id number] Yes TAKE 1 TABLET BY MOUTH TWICE DAILY WITH A MEAL Amoako, Prince, MD Taking Active Self  cloNIDine  (CATAPRES  - DOSED IN MG/24 HR) 0.1 mg/24hr patch [id number] Yes Place 0.1 mg onto the skin once a week. Thursdays [provider] Taking Active Self           Med Note Baltazar Leventhal, [place]   Fri Apr 27, 2023  3:14 PM)    Continuous Glucose Receiver (FREESTYLE LIBRE 2 READER) DEVI [id number]  Use with sensors to monitor blood glucose Thapa, [place], MD  Active   Continuous Glucose Receiver (FREESTYLE LIBRE 2 READER) DEVI [id number]  1 each by Does not apply route daily. Thapa, [place], MD  Active   Continuous Glucose Sensor (FREESTYLE LIBRE 2 SENSOR) [place] [id number]  Change every 14 days as directed by manufacturer Thapa, [place], MD  Active   diclofenac  Sodium (VOLTAREN ) 1 % GEL [id number] Yes Apply 2 g topically 4 (four) times daily.  Patient taking differently: Apply 2 g topically daily as needed (for pain).   Freada Jacobs, MD Taking Active Self  ELIQUIS  5 MG TABS tablet [id number] Yes Take 1 tablet by mouth twice daily Priscella Brooms, DO Taking Active Self           Med Note (SATTERFIELD, DARIUS E   Sun Apr 29, 2023  8:47 PM) @1800   fluticasone  (FLONASE ) 50 MCG/ACT nasal spray [id number] Yes Place 2 sprays into both nostrils daily.  Patient taking differently: Place 2 sprays into both nostrils daily as needed for allergies.   Freada Jacobs, MD Taking Active Self  furosemide  (LASIX ) 80 MG tablet [id number] Yes Take 1 tablet (80 mg total) by mouth 2 (two) times daily.  Patient taking differently: Take  100 mg by mouth 2 (two) times daily. Patient states he saw [facility] 05/17/23 and states this was increased to 100mg  2x/day   Aurora Lees, DO Taking Active Self           Med Note [facility], RHONDA A   Tue Mar 27, 2023  9:40 AM) Patient reports 03/22/23 nephrology instructed patient to take 80mg  M,W,F 3x/day; and all other days 80mg  2x/day  gabapentin  (NEURONTIN ) 100 MG capsule [id number] Yes TAKE 1 CAPSULE BY MOUTH IN THE MORNING AND 1 IN THE AFTERNOON AND 2 NIGHTLY Amoako, Prince, MD Taking Active   hydrALAZINE  (APRESOLINE ) 100 MG tablet [id number] Yes Take 100 mg by mouth 3 (three) times daily. [provider]  Taking Active Self  insulin  degludec (TRESIBA  FLEXTOUCH) 200 UNIT/ML FlexTouch Pen [id number] Yes Inject 36 Units into the skin daily.  Patient taking differently: Inject 35 Units into the skin daily.   Jayson Michael, MD Taking Active   Insulin  Pen Needle 32G X 4 MM MISC [id number] Yes Use as directed  Taking Active   lidocaine  (HM LIDOCAINE  PATCH) 4 % [id number] Yes Place 1 patch onto the skin daily. True Fuss, MD Taking Active Self           Med Note (Gildardo Tickner P   Wed Jun 06, 2023  3:40 PM) Taking as needed  lovastatin  (MEVACOR ) 20 MG tablet [id number] Yes Take 1 tablet by mouth once daily Priscella Brooms, DO Taking Active   NOVOLOG  FLEXPEN 100 UNIT/ML FlexPen [id number] Yes Inject 0-10 Units into the skin 3 (three) times daily with meals. Glucose 141-180: inject 4 units, Glucose 181-220: inject 6 units, Glucose 221-260: inject 8 units, Glucose 261-300: inject 10 units, If glucose if greater than 300, please inject 12 units and call your PCP and/or endocrinologist office Aurora Lees, DO Taking Active Self  oxybutynin  (DITROPAN ) 5 MG tablet [id number]  Take 1 tablet (5 mg total) by mouth 3 (three) times daily. Atway, Rayann N, DO  Active   pantoprazole  (PROTONIX ) 20 MG tablet [id number]  Take 1 tablet (20 mg total) by mouth daily.  Patient not taking: Reported on 05/17/2023   Aurora Lees, DO  Active Self           Med Note [facility], RHONDA A   Mon Mar 19, 2023 10:00 AM) Patient states he does not have this medication and states he does not have reflux  tamsulosin  (FLOMAX ) 0.4 MG CAPS capsule [id number]  Take 1 capsule by mouth at bedtime Priscella Brooms, DO  Active   tirzepatide  (MOUNJARO ) 15 MG/0.5ML Pen [id number]  Inject 15 mg into the skin once a week. Thapa, [place], MD  Active Self           Med Note (SATTERFIELD, DARIUS E   Wed Mar 14, 2023  3:12 PM) Take on Wednesdays             Recommendation:   PCP Follow-up Continue Current Plan of Care  Follow Up Plan:   Telephone  follow up appointment date/time:  06/20/23 at 3:30 PM  Theodora Fish, RN MSN [place]  [facility] RN Care Manager Direct Dial: [phone]  Fax: [phone]

## 2023-06-07 ENCOUNTER — Ambulatory Visit: Admitting: Occupational Therapy

## 2023-06-07 ENCOUNTER — Encounter: Payer: Self-pay | Admitting: Occupational Therapy

## 2023-06-07 DIAGNOSIS — M79601 Pain in right arm: Secondary | ICD-10-CM | POA: Diagnosis not present

## 2023-06-07 DIAGNOSIS — M25511 Pain in right shoulder: Secondary | ICD-10-CM

## 2023-06-07 DIAGNOSIS — M6281 Muscle weakness (generalized): Secondary | ICD-10-CM | POA: Diagnosis not present

## 2023-06-07 DIAGNOSIS — E113311 Type 2 diabetes mellitus with moderate nonproliferative diabetic retinopathy with macular edema, right eye: Secondary | ICD-10-CM | POA: Diagnosis not present

## 2023-06-07 DIAGNOSIS — R2681 Unsteadiness on feet: Secondary | ICD-10-CM | POA: Diagnosis not present

## 2023-06-07 NOTE — Therapy (Signed)
 OUTPATIENT OCCUPATIONAL THERAPY ORTHO TREATMENT  Patient Name: [name] MRN: [id number] DOB:1975-07-30, 48 y.o., male Today's Date: 06/07/2023  PCP: Dr. Wilnette Haste REFERRING PROVIDER: Cherylene Corrente, MD  END OF SESSION:  OT End of Session - 06/07/23 0802     Visit Number 3    Number of Visits 13    Date for OT Re-Evaluation 07/06/23    Authorization Type [employer] Medicare, Dual covered 100%    Authorization - Number of Visits 10    Progress Note Due on Visit 10    OT Start Time 0800    OT Stop Time 0845    OT Time Calculation (min) 45 min    Activity Tolerance Patient tolerated treatment well    Behavior During Therapy [facility] for tasks assessed/performed             Past Medical History:  Diagnosis Date   Acute venous embolism and thrombosis of deep vessels of proximal lower extremity (HCC) 07/19/2011   Anemia    Anginal pain (HCC)    pt denies   Anxiety    Chest pain, neg MI, normal coronaries by cath 02/18/2013   pt denies   CHF (congestive heart failure) (HCC)    Chronic renal disease, stage IV (HCC) 02/19/2013   Colon polyps    Depression    Diabetic ulcer of right foot (HCC)    DVT (deep venous thrombosis) (HCC) 09/2002   patient reports additional DVTs in '06 & '11 (unconfirmed)   ED (erectile dysfunction)    GERD (gastroesophageal reflux disease)    History of blood transfusion    "related to OR" (10/31/2016)   Hyperlipidemia 02/19/2013   Hypertension    Nausea & vomiting    "constant for the last couple weeks" (10/31/2016)   Nephrotic syndrome    Obesity    BMI 44, weight 346 pounds 01/30/14   OSA (obstructive sleep apnea) 02/28/2018   Mild obstructive sleep apnea with an AHI of 9.8/h but severe during REM sleep with an AHI of 33.8/h.  Oxygen  saturations dropped to 86% and there was moderate snoring   Peripheral edema    Pneumonia    Prosthesis adjustment 08/17/2016   Pulmonary embolism (HCC) 09/2002   treated with 6 months of warfarin    Pyelonephritis 02/02/2018   Type I diabetes mellitus (HCC) dx'd 2001   Urinary retention    Past Surgical History:  Procedure Laterality Date   AMPUTATION Right 12/22/2013   Procedure: AMPUTATION BELOW KNEE;  Surgeon: Amada Backer, MD;  Location: [facility] OR;  Service: Orthopedics;  Laterality: Right;   AMPUTATION Right 02/19/2014   Procedure: RIGHT ABOVE KNEE AMPUTATION ;  Surgeon: Amada Backer, MD;  Location: [facility] OR;  Service: Orthopedics;  Laterality: Right;   blood clot removal  2016   CARDIAC CATHETERIZATION  02/18/2013   normal coronaries   ESOPHAGOGASTRODUODENOSCOPY N/A 11/02/2016   Procedure: ESOPHAGOGASTRODUODENOSCOPY (EGD);  Surgeon: Kenney Peacemaker, MD;  Location: [facility] ENDOSCOPY;  Service: Endoscopy;  Laterality: N/A;   I & D EXTREMITY Right 12/14/2013   Procedure: IRRIGATION AND DEBRIDEMENT RIGHT FOOT;  Surgeon: Lorriane Rote, MD;  Location: [facility] OR;  Service: Orthopedics;  Laterality: Right;   INCISION AND DRAINAGE ABSCESS  2007; 2015   "back"   INCISION AND DRAINAGE OF WOUND Right 12/22/2013   Procedure: I&D RIGHT BUTTOCK;  Surgeon: Amada Backer, MD;  Location: [facility] OR;  Service: Orthopedics;  Laterality: Right;   LEFT HEART CATHETERIZATION WITH CORONARY ANGIOGRAM N/A 02/18/2013   Procedure:  LEFT HEART CATHETERIZATION WITH CORONARY ANGIOGRAM;  Surgeon: Peter M Swaziland, MD;  Location: [facility] CATH LAB;  Service: Cardiovascular;  Laterality: N/A;   Patient Active Problem List   Diagnosis Date Noted   Tendinosis of right shoulder, possible supraspinatus tear 05/08/2023   Gout of right wrist 03/26/2023   Insomnia 03/26/2023   Volume overload 03/21/2023   Inflammatory arthropathy 02/23/2023   Pain and swelling of right wrist 02/22/2023   Medial epicondylitis of elbow, left 05/06/2022   Diabetic neuropathy (HCC) 01/24/2022   Trapezius strain, left, initial encounter 02/02/2021   Left shoulder pain 12/30/2020   Benign paroxysmal positional vertigo 09/14/2020   Anemia associated with chronic renal  failure    Hemoglobinuria 12/12/2018   OSA (obstructive sleep apnea) 02/28/2018   Acute on chronic diastolic heart failure (HCC) 02/02/2018   Preventative health care 08/15/2017   Erectile dysfunction 04/22/2017   History of pulmonary embolus (PE) 06/09/2016   Depression 05/04/2016   Hypogonadism in male 05/04/2016   Urinary retention 05/04/2016   Dyslipidemia 08/12/2014   Unilateral AKA, right (HCC) 12/26/2013   Hyperlipidemia 02/19/2013   S/P cardiac cath, 02/18/13, normal coronaries 02/19/2013   CKD (chronic kidney disease) stage 4, GFR 15-29 ml/min (HCC) 02/19/2013   Hypertension 07/24/2011   Long term current use of anticoagulant therapy 07/19/2011   History of DVT (deep vein thrombosis) 07/28/2009   Morbid obesity (HCC) 10/26/2005   Insulin  dependent type 2 diabetes mellitus, controlled (HCC) 01/09/2002    ONSET DATE: 04/26/23  REFERRING DIAG: M79.601 (ICD-10-CM) - Right arm pain  THERAPY DIAG:  Acute pain of right shoulder  Muscle weakness (generalized)  Rationale for Evaluation and Treatment: Rehabilitation  SUBJECTIVE:   SUBJECTIVE STATEMENT: Pain 2/10 Rt shoulder Pt accompanied by: self  PERTINENT HISTORY: patient admitted on 4/17-4/22 for right arm swelling and pain concern for gouty arthritis and septic joint given hx of gouty arthritis in past, negative for crystals on arthrocentesis, cultures negative so empiric abx stopped. MRI of R shoulder noted tendinosis and possible small supraspinatus tear with mild subacromial/subdeltoid bursitis. Orthopedics rec sling and patient has outpatient f/u with them.  PMH:  R AKA 2016, hx of anemia, anxiety, CHF, chronic kidney disease disease, depression, hx of DVT and PE, GERD, OSA, type 1 diabetes, obesity, HTN, dyslipidemia  PRECAUTIONS: Fall  WEIGHT BEARING RESTRICTIONS: No  PAIN:  Are you having pain? Yes: NPRS scale: 3/10 today, 0-3/10 Pain location: top of shoulder  Pain description: achy/sore Aggravating  factors: laying on it Relieving factors: topical gel, muscle relaxer  FALLS: Has patient fallen in last 6 months? No  LIVING ENVIRONMENT: Lives with: lives alone Stairs: No Has following equipment at home: Counselling psychologist, Environmental consultant - 2 wheeled, Wheelchair (manual), and Tour manager  PLOF: Independent with basic ADLs  PATIENT GOALS: get stronger, improve pain, and ROM  NEXT MD VISIT: not yet scheduled  OBJECTIVE:  Note: Objective measures were completed at Evaluation unless otherwise noted.  HAND DOMINANCE: Right  ADLs:   Uses prosthetic some with RW to ambulate, uses w/c most of the time Overall ADLs: Pt reports performing BADLs mod I, minimal pain with transfers/using walker.  Pt does have an aide that assists with some household tasks.   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 Pt enjoys reading, writing, work out, spend time with friends, cooking, outdoors, sporting events, fishing, axe throwing, dining out.     FUNCTIONAL OUTCOME MEASURES: Quick Dash: 9%  UPPER EXTREMITY ROM:  RUE grossly WNL, but pt notes some tightness at end ranges of shoulder movement   UPPER EXTREMITY MMT:     MMT Right eval Left eval  Shoulder flexion 5/5 5/5  Shoulder abduction 4+/5 5/5  Shoulder adduction 5/5 5/5  Shoulder extension    Shoulder internal rotation    Shoulder external rotation    Middle trapezius    Lower trapezius    Elbow flexion 5/5 5/5  Elbow extension 5/5 5/5  Wrist flexion    Wrist extension    Wrist ulnar deviation    Wrist radial deviation    Wrist pronation    Wrist supination    (Blank rows = not tested)  HAND FUNCTION: Grip strength: Right: 42 lbs; Left: 60 lbs  COORDINATION: WNL  SENSATION: Pt reports numbness/tingling in thumb and 2-3rd digits, but controlled with meds.  EDEMA: none noted   COGNITION: Overall cognitive status: Within functional limits for tasks assessed  OBSERVATIONS: Pt very  motivated for improvement.   TREATMENT DATE:   06/07/23:     Pt more alert today and participating much more than last session. However blood sugar levels were low = 69. Pt given PB crackers but pt politely refused soda. After approx 10 min. went up to 73. Opted not to perform theraband HEP today as blood sugars still low (no symptoms and pt felt fine), but did review wrist HEP.   Pt also shown flex bar and several ex's including: twisting w/ bar horizontal and vertical, and bending bar for wrist and forearm strengthening. Pt using red flex bar.   Pt issued putty HEP (green resistance) for Rt hand strengthening - see pt instructions for details. Pt required cueing to perform correctly and would benefit from review  Blood sugar back up to 86 at end of session  PATIENT EDUCATION: Education details: wrist HEP, theraband HEP  Person educated: Patient Education method: Explanation, Demonstration, Verbal cues, and Handouts Education comprehension: verbalized understanding, returned demonstration, verbal cues required, and needs further education  HOME EXERCISE PROGRAM: 05/22/23:  HEP for shoulder stretch 06/05/23: Wrist HEP, theraband HEP   GOALS: Goals reviewed with patient? No  SHORT TERM GOALS: Target date: 06/22/23  Pt will be independent with initial HEP for R shoulder. Goal status: IN PROGRESS  2.  Pt will be independent with HEP for R wrist stretching and strength. Goal status: IN PROGRESS  3.  Pt will improve R grip strength to at least 6lbs to assist with ADLs/IADLs Baseline:  42lbs Goal status: IN PROGRESS  4.  Pt will verbalize understanding of proper positioning of R shoulder and wrist to decr risk of injury. Goal status: IN PROGRESS    LONG TERM GOALS: Target date: 07/06/23  Pt will be independent with updated HEP and appropriate community fitness activities. Baseline:  Goal status: INITIAL  2.  Pt will improve RUE functional use/pain as shown by improving Quick  Dash to 4.5% or less Baseline: 9% Goal status: INITIAL  3.  Pt will improve R grip strength to at least 12lbs to assist with ADLs/IADLs Baseline:  42lbs Goal status: INITIAL    ASSESSMENT:  CLINICAL IMPRESSION: Patient seen today for occupational therapy treatment for R wrist and hand strengthening. Blood sugars low today. Pt progressing towards all STG's. Pt was hospitalized on 4/17-4/22/25 for right arm swelling and pain with concern for gouty arthritis and septic joint given hx of gouty arthritis in past, negative for crystals on arthrocentesis, cultures negative so empiric abx stopped. MRI of R  shoulder noted tendinosis and possible small supraspinatus tear with mild subacromial/subdeltoid bursitis. Pt with PMH that also includes:  R AKA 2016, hx of anemia, anxiety, CHF, chronic kidney disease disease, depression, hx of DVT and PE, GERD, OSA, type 1 diabetes, obesity, HTN, dyslipidemia.  Pt requires UE use for w/c and walker mobility due to hx of R AKA.  Pt presents today with RUE pain and weakness and would benefit from continued occupational therapy to address these for incr dominant RUE functional use, improved quality of life.  PERFORMANCE DEFICITS: in functional skills including ADLs, IADLs, strength, pain, and UE functional use, , and psychosocial skills including habits.   IMPAIRMENTS: are limiting patient from ADLs, IADLs, and leisure.   COMORBIDITIES: may have co-morbidities  that affects occupational performance. Patient will benefit from skilled OT to address above impairments and improve overall function.  MODIFICATION OR ASSISTANCE TO COMPLETE EVALUATION: Min-Moderate modification of tasks or assist with assess necessary to complete an evaluation.  OT OCCUPATIONAL PROFILE AND HISTORY: Detailed assessment: Review of records and additional review of physical, cognitive, psychosocial history related to current functional performance.  CLINICAL DECISION MAKING: Moderate -  several treatment options, min-mod task modification necessary  REHAB POTENTIAL: Good  EVALUATION COMPLEXITY: Low      PLAN:  OT FREQUENCY: 2x/week  OT DURATION: 6 weeks +eval  PLANNED INTERVENTIONS: 97535 self care/ADL training, 40981 therapeutic exercise, 97530 therapeutic activity, 97112 neuromuscular re-education, 97140 manual therapy, 97035 ultrasound, 97018 paraffin, 19147 fluidotherapy, 97010 moist heat, 97010 cryotherapy, passive range of motion, functional mobility training, patient/family education, and DME and/or AE instructions  RECOMMENDED OTHER SERVICES: none at this time  CONSULTED AND AGREED WITH PLAN OF CARE: Patient  PLAN FOR NEXT SESSION: monitor blood sugar levels, review theraband HEP, massage and ultrasound next session for Rt shoulder pain.   (Following session: taping for RT shoulder)    Velinda Getting, OTR/L 06/07/2023, 8:59 AM

## 2023-06-07 NOTE — Patient Instructions (Signed)
 1. Grip Strengthening (Resistive Putty)   Squeeze putty using thumb and all fingers. Repeat _20___ times. Do __2__ sessions per day.   2. Roll putty into tube on table and pinch between first two fingers and thumb x 10 reps. Do 2 sessions per day  3. MP Flexion (Resistive Putty)    Bending only at large knuckles, press putty down against thumb. Keep fingertips straight. Then pull apart like taffy Repeat _10___ times. Do __2__ sessions per day.

## 2023-06-08 ENCOUNTER — Encounter (HOSPITAL_COMMUNITY)
Admission: RE | Admit: 2023-06-08 | Discharge: 2023-06-08 | Disposition: A | Discharge status: Home / Self Care | Source: Ambulatory Visit | Attending: Nephrology | Admitting: Nephrology

## 2023-06-08 VITALS — BP 141/73 | HR 88 | Temp 97.2°F | Resp 16

## 2023-06-08 DIAGNOSIS — N184 Chronic kidney disease, stage 4 (severe): Secondary | ICD-10-CM

## 2023-06-08 LAB — IRON AND TIBC
Iron: 39 ug/dL — ABNORMAL LOW (ref 45–182)
Saturation Ratios: 13 % — ABNORMAL LOW (ref 17.9–39.5)
TIBC: 311 ug/dL (ref 250–450)
UIBC: 272 ug/dL

## 2023-06-08 LAB — RENAL FUNCTION PANEL
Albumin: 3.4 g/dL — ABNORMAL LOW (ref 3.5–5.0)
Anion gap: 10 (ref 5–15)
BUN: 56 mg/dL — ABNORMAL HIGH (ref 6–20)
CO2: 22 mmol/L (ref 22–32)
Calcium: 9.2 mg/dL (ref 8.9–10.3)
Chloride: 106 mmol/L (ref 98–111)
Creatinine, Ser: 2.98 mg/dL — ABNORMAL HIGH (ref 0.61–1.24)
GFR, Estimated: 25 mL/min — ABNORMAL LOW (ref >60)
Glucose, Bld: 76 mg/dL (ref 70–99)
Phosphorus: 4.7 mg/dL — ABNORMAL HIGH (ref 2.5–4.6)
Potassium: 4.2 mmol/L (ref 3.5–5.1)
Sodium: 138 mmol/L (ref 135–145)

## 2023-06-08 LAB — POCT HEMOGLOBIN-HEMACUE: Hemoglobin: 11.1 g/dL — ABNORMAL LOW (ref 13.0–17.0)

## 2023-06-08 LAB — FERRITIN: Ferritin: 203 ng/mL (ref 24–336)

## 2023-06-08 MED ORDER — DARBEPOETIN ALFA 200 MCG/0.4ML IJ SOSY
PREFILLED_SYRINGE | INTRAMUSCULAR | Status: AC
  Filled 2023-06-08: qty 0.4

## 2023-06-08 MED ORDER — DARBEPOETIN ALFA 200 MCG/0.4ML IJ SOSY
200.0000 ug | PREFILLED_SYRINGE | INTRAMUSCULAR | Status: DC
  Administered 2023-06-08: 200 ug via SUBCUTANEOUS

## 2023-06-11 ENCOUNTER — Encounter: Payer: Self-pay | Admitting: Student

## 2023-06-11 ENCOUNTER — Other Ambulatory Visit: Payer: Self-pay

## 2023-06-11 ENCOUNTER — Ambulatory Visit (INDEPENDENT_AMBULATORY_CARE_PROVIDER_SITE_OTHER): Admitting: Student

## 2023-06-11 VITALS — BP 132/59 | HR 84 | Temp 97.6°F | Ht 74.0 in | Wt 319.0 lb

## 2023-06-11 DIAGNOSIS — Z7985 Long-term (current) use of injectable non-insulin antidiabetic drugs: Secondary | ICD-10-CM

## 2023-06-11 DIAGNOSIS — E113212 Type 2 diabetes mellitus with mild nonproliferative diabetic retinopathy with macular edema, left eye: Secondary | ICD-10-CM | POA: Diagnosis not present

## 2023-06-11 DIAGNOSIS — M67813 Other specified disorders of tendon, right shoulder: Secondary | ICD-10-CM

## 2023-06-11 DIAGNOSIS — Z794 Long term (current) use of insulin: Secondary | ICD-10-CM

## 2023-06-11 DIAGNOSIS — E1140 Type 2 diabetes mellitus with diabetic neuropathy, unspecified: Secondary | ICD-10-CM

## 2023-06-11 DIAGNOSIS — E119 Type 2 diabetes mellitus without complications: Secondary | ICD-10-CM

## 2023-06-11 MED ORDER — GABAPENTIN 100 MG PO CAPS: ORAL_CAPSULE | ORAL | 2 refills | Status: DC

## 2023-06-11 NOTE — Assessment & Plan Note (Addendum)
 Presents for 1 month follow-up for right shoulder tendinosis with possible small supraspinatus tear with mild subacromial/subdeltoid bursitis.  Report pain and ROM has improved.  Working with PT/OT and neck session tomorrow.  Has been using Voltaren  gel and as needed Tylenol  with relief. Still has Robaxin  as needed. Does not have any difficulty with transfer from his wheelchair.  Exam with improvement of ROM.  Expect to improve over course with continued PT/OT.

## 2023-06-11 NOTE — Assessment & Plan Note (Addendum)
 Follows with [facility] with next appointment in August.  Remains on Tresiba  35 units, NovoLog  SSI, Mounjaro  15 mg. Patient denies any hypoglycemia. Unable to void today for urine ACR.

## 2023-06-11 NOTE — Progress Notes (Signed)
 CC: 1 month follow-up for right shoulder tendinosis with possible supraspinatus tear  HPI:  [name] is a 49 y.o. male living with a history stated below and presents today for 1 month follow-up. Please see problem based assessment and plan for additional details.  PMH: Hypertension, HFpEF, OSA, type 2 diabetes followed by endocrinology with peripheral neuropathy, CKD 4, right AKA, history of PE DVT on Eliquis  Past Medical History:  Diagnosis Date   Acute venous embolism and thrombosis of deep vessels of proximal lower extremity (HCC) 07/19/2011   Anemia    Anginal pain (HCC)    pt denies   Anxiety    Chest pain, neg MI, normal coronaries by cath 02/18/2013   pt denies   CHF (congestive heart failure) (HCC)    Chronic renal disease, stage IV (HCC) 02/19/2013   Colon polyps    Depression    Diabetic ulcer of right foot (HCC)    DVT (deep venous thrombosis) (HCC) 09/2002   patient reports additional DVTs in '06 & '11 (unconfirmed)   ED (erectile dysfunction)    GERD (gastroesophageal reflux disease)    History of blood transfusion    "related to OR" (10/31/2016)   Hyperlipidemia 02/19/2013   Hypertension    Nausea & vomiting    "constant for the last couple weeks" (10/31/2016)   Nephrotic syndrome    Obesity    BMI 44, weight 346 pounds 01/30/14   OSA (obstructive sleep apnea) 02/28/2018   Mild obstructive sleep apnea with an AHI of 9.8/h but severe during REM sleep with an AHI of 33.8/h.  Oxygen  saturations dropped to 86% and there was moderate snoring   Peripheral edema    Pneumonia    Prosthesis adjustment 08/17/2016   Pulmonary embolism (HCC) 09/2002   treated with 6 months of warfarin   Pyelonephritis 02/02/2018   Type I diabetes mellitus (HCC) dx'd 2001   Urinary retention     Current Outpatient Medications on File Prior to Visit  Medication Sig Dispense Refill   acetaminophen  (TYLENOL ) 325 MG tablet Take 2 tablets (650 mg total) by mouth every 6 (six)  hours. (Patient taking differently: Take 650 mg by mouth every 6 (six) hours as needed for mild pain (pain score 1-3).) 30 tablet 0   allopurinol  (ZYLOPRIM ) 100 MG tablet Take 0.5 tablets (50 mg total) by mouth every other day. 30 tablet 2   amLODipine  (NORVASC ) 10 MG tablet Take 10 mg by mouth at bedtime.     carvedilol  (COREG ) 25 MG tablet TAKE 1 TABLET BY MOUTH TWICE DAILY WITH A MEAL 180 tablet 0   cloNIDine  (CATAPRES  - DOSED IN MG/24 HR) 0.1 mg/24hr patch Place 0.1 mg onto the skin once a week. Thursdays     Continuous Glucose Receiver (FREESTYLE LIBRE 2 READER) DEVI Use with sensors to monitor blood glucose 1 each 0   Continuous Glucose Receiver (FREESTYLE LIBRE 2 READER) DEVI 1 each by Does not apply route daily. 1 each 0   Continuous Glucose Sensor (FREESTYLE LIBRE 2 SENSOR) MISC Change every 14 days as directed by manufacturer 6 each 3   diclofenac  Sodium (VOLTAREN ) 1 % GEL Apply 2 g topically 4 (four) times daily. (Patient taking differently: Apply 2 g topically daily as needed (for pain).) 100 g 3   ELIQUIS  5 MG TABS tablet Take 1 tablet by mouth twice daily 180 tablet 0   fluticasone  (FLONASE ) 50 MCG/ACT nasal spray Place 2 sprays into both nostrils daily. (Patient taking differently: Place 2  sprays into both nostrils daily as needed for allergies.) 15.8 mL 2   furosemide  (LASIX ) 80 MG tablet Take 1 tablet (80 mg total) by mouth 2 (two) times daily. (Patient taking differently: Take 100 mg by mouth 2 (two) times daily. Patient states he saw [facility] 05/17/23 and states this was increased to 100mg  2x/day) 30 tablet 1   hydrALAZINE  (APRESOLINE ) 100 MG tablet Take 100 mg by mouth 3 (three) times daily.     insulin  degludec (TRESIBA  FLEXTOUCH) 200 UNIT/ML FlexTouch Pen Inject 36 Units into the skin daily. (Patient taking differently: Inject 35 Units into the skin daily.) 15 mL 0   Insulin  Pen Needle 32G X 4 MM MISC Use as directed 100 each 0   lidocaine  (HM LIDOCAINE  PATCH) 4 % Place 1  patch onto the skin daily. 5 patch 0   lovastatin  (MEVACOR ) 20 MG tablet Take 1 tablet by mouth once daily 90 tablet 0   NOVOLOG  FLEXPEN 100 UNIT/ML FlexPen Inject 0-10 Units into the skin 3 (three) times daily with meals. Glucose 141-180: inject 4 units, Glucose 181-220: inject 6 units, Glucose 221-260: inject 8 units, Glucose 261-300: inject 10 units, If glucose if greater than 300, please inject 12 units and call your PCP and/or endocrinologist office 30 mL 4   oxybutynin  (DITROPAN ) 5 MG tablet Take 1 tablet (5 mg total) by mouth 3 (three) times daily. 90 tablet 1   pantoprazole  (PROTONIX ) 20 MG tablet Take 1 tablet (20 mg total) by mouth daily. (Patient not taking: Reported on 05/17/2023) 25 tablet 0   tamsulosin  (FLOMAX ) 0.4 MG CAPS capsule Take 1 capsule by mouth at bedtime 90 capsule 0   tirzepatide  (MOUNJARO ) 15 MG/0.5ML Pen Inject 15 mg into the skin once a week. 6 mL 4   No current facility-administered medications on file prior to visit.    Family History  Problem Relation Age of Onset   Heart disease Mother    Diabetes Mother    Diabetes Maternal Uncle    Diabetes Cousin     Social History   Socioeconomic History   Marital status: Single    Spouse name: Not on file   Number of children: 0   Years of education: 15.5   Highest education level: Some college, no degree  Occupational History   Occupation: Consulting civil engineer    Comment: [employer]   Occupation: disability  Tobacco Use   Smoking status: Never   Smokeless tobacco: Never  Vaping Use   Vaping status: Never Used  Substance and Sexual Activity   Alcohol use: Yes    Comment: 10/31/2016 "a few beers q 6 months or so"   Drug use: No   Sexual activity: Yes  Other Topics Concern   Not on file  Social History Narrative   Financial assistance approved for 100% discount at [facility] and has [facility] card per [employer]   09/01/2009.      Lives in [place] with mother and sister.            Social Drivers of Health   Financial  Resource Strain: Medium Risk (01/31/2023)   Overall Financial Resource Strain (CARDIA)    Difficulty of Paying Living Expenses: Somewhat hard  Food Insecurity: No Food Insecurity (06/06/2023)   Hunger Vital Sign    Worried About Running Out of Food in the Last Year: Never true    Ran Out of Food in the Last Year: Never true  Transportation Needs: No Transportation Needs (06/06/2023)   PRAPARE - Transportation  Lack of Transportation (Medical): No    Lack of Transportation (Non-Medical): No  Physical Activity: Sufficiently Active (01/31/2023)   Exercise Vital Sign    Days of Exercise per Week: 6 days    Minutes of Exercise per Session: 120 min  Stress: No Stress Concern Present (01/31/2023)   [employer] of Occupational Health - Occupational Stress Questionnaire    Feeling of Stress : Not at all  Social Connections: Socially Isolated (01/31/2023)   Social Connection and Isolation Panel [NHANES]    Frequency of Communication with Friends and Family: More than three times a week    Frequency of Social Gatherings with Friends and Family: More than three times a week    Attends Religious Services: Never    Database administrator or Organizations: No    Attends Banker Meetings: Never    Marital Status: Never married  Intimate Partner Violence: Not At Risk (06/06/2023)   Humiliation, Afraid, Rape, and Kick questionnaire    Fear of Current or Ex-Partner: No    Emotionally Abused: No    Physically Abused: No    Sexually Abused: No    Review of Systems: ROS negative except for what is noted on the assessment and plan.  Vitals:   06/11/23 0936  BP: (!) 132/59  Pulse: 84  Temp: 97.6 F (36.4 C)  TempSrc: Oral  SpO2: 100%  Weight: (!) 319 lb (144.7 kg)  Height: 6\' 2"  (1.88 m)   Physical Exam: Constitutional: well-appearing male sitting in wheelchair, in no acute distress Cardiovascular: regular rate Pulmonary/Chest: normal work of breathing on room air MSK: ROM  of right shoulder improved from last visit, some limitations with overhead abduction but no deformity or swelling, right AKA Neurological: alert & oriented x 3  Assessment & Plan:   Tendinosis of right shoulder, possible supraspinatus tear Presents for 1 month follow-up for right shoulder tendinosis with possible small supraspinatus tear with mild subacromial/subdeltoid bursitis.  Report pain and ROM has improved.  Working with PT/OT and neck session tomorrow.  Has been using Voltaren  gel and as needed Tylenol  with relief. Still has Robaxin  as needed. Does not have any difficulty with transfer from his wheelchair.  Exam with improvement of ROM.  Expect to improve over course with continued PT/OT.   Insulin  dependent type 2 diabetes mellitus, controlled (HCC) Follows with [facility] with next appointment in August.  Remains on Tresiba  35 units, NovoLog  SSI, Mounjaro  15 mg. Patient denies any hypoglycemia. Unable to void today for urine ACR.   Diabetic neuropathy (HCC) History of diabetic neuropathy taking gabapentin  100 mg in the morning, 100 mg in afternoon and 200 mg at night.  Has history of CKD 4 followed by nephrology.  CrCl based off recent BMP on 5/30 was ~47. Endorses continued neuropathy so will increase slightly but keep renally dosed.   Plan - Increase gabapentin  (remain renally dosed) to 200 mg in morning, 100 mg in afternoon and 200 mg at night (total 500 mg daily)   Patient discussed with Dr. Wynn Hector, D.O. [facility] Internal [facility], PGY-2 Phone: [phone] Date 06/11/2023 Time 4:59 PM

## 2023-06-11 NOTE — Assessment & Plan Note (Addendum)
 History of diabetic neuropathy taking gabapentin  100 mg in the morning, 100 mg in afternoon and 200 mg at night.  Has history of CKD 4 followed by nephrology.  CrCl based off recent BMP on 5/30 was ~47. Endorses continued neuropathy so will increase slightly but keep renally dosed.   Plan - Increase gabapentin  (remain renally dosed) to 200 mg in morning, 100 mg in afternoon and 200 mg at night (total 500 mg daily)

## 2023-06-11 NOTE — Patient Instructions (Addendum)
 Thank you, [name] for allowing us  to provide your care today. Today we discussed   -I am glad your right shoulder is gradually improving -Continue working with PT and OT  -We will increase your morning gabapentin  to 200 mg in morning. Continue taking gabapentin  100 mg in afternoon and 200 mg in evening.   I have ordered the following medication/changed the following medications:  Start the following medications: Meds ordered this encounter  Medications   gabapentin  (NEURONTIN ) 100 MG capsule    Sig: TAKE 2 CAPSULE BY MOUTH IN THE MORNING, 1 IN THE AFTERNOON AND 2 NIGHTLY    Dispense:  300 capsule    Refill:  2    Follow Up: 2-3 months   Should you have any questions or concerns please call the internal medicine clinic at [phone].    Jeancarlo Leffler, D.O. [facility]

## 2023-06-12 ENCOUNTER — Encounter: Payer: Self-pay | Admitting: Occupational Therapy

## 2023-06-12 ENCOUNTER — Ambulatory Visit: Attending: Internal Medicine | Admitting: Occupational Therapy

## 2023-06-12 DIAGNOSIS — M25511 Pain in right shoulder: Secondary | ICD-10-CM | POA: Diagnosis not present

## 2023-06-12 DIAGNOSIS — R2681 Unsteadiness on feet: Secondary | ICD-10-CM | POA: Insufficient documentation

## 2023-06-12 DIAGNOSIS — R29898 Other symptoms and signs involving the musculoskeletal system: Secondary | ICD-10-CM | POA: Diagnosis not present

## 2023-06-12 DIAGNOSIS — R278 Other lack of coordination: Secondary | ICD-10-CM | POA: Diagnosis not present

## 2023-06-12 DIAGNOSIS — M6281 Muscle weakness (generalized): Secondary | ICD-10-CM | POA: Diagnosis not present

## 2023-06-12 NOTE — Progress Notes (Signed)
 Internal Medicine Clinic Attending  Case discussed with the resident at the time of the visit.  We reviewed the resident's history and exam and pertinent patient test results.  I agree with the assessment, diagnosis, and plan of care documented in the resident's note.

## 2023-06-12 NOTE — Therapy (Signed)
 OUTPATIENT OCCUPATIONAL THERAPY ORTHO TREATMENT  Patient Name: [name] MRN: [id number] DOB:15-Jun-1975, 48 y.o., male Today's Date: 06/12/2023  PCP: Dr. Wilnette Haste REFERRING PROVIDER: Cherylene Corrente, MD  END OF SESSION:  OT End of Session - 06/12/23 0937     Visit Number 4    Number of Visits 13    Date for OT Re-Evaluation 07/06/23    Authorization Type UHC Medicare, Dual covered 100%    Authorization - Number of Visits 10    Progress Note Due on Visit 10    OT Start Time 0935    OT Stop Time 1015    OT Time Calculation (min) 40 min    Activity Tolerance Patient tolerated treatment well    Behavior During Therapy [facility] for tasks assessed/performed             Past Medical History:  Diagnosis Date   Acute venous embolism and thrombosis of deep vessels of proximal lower extremity (HCC) 07/19/2011   Anemia    Anginal pain (HCC)    pt denies   Anxiety    Chest pain, neg MI, normal coronaries by cath 02/18/2013   pt denies   CHF (congestive heart failure) (HCC)    Chronic renal disease, stage IV (HCC) 02/19/2013   Colon polyps    Depression    Diabetic ulcer of right foot (HCC)    DVT (deep venous thrombosis) (HCC) 09/2002   patient reports additional DVTs in '06 & '11 (unconfirmed)   ED (erectile dysfunction)    GERD (gastroesophageal reflux disease)    History of blood transfusion    "related to OR" (10/31/2016)   Hyperlipidemia 02/19/2013   Hypertension    Nausea & vomiting    "constant for the last couple weeks" (10/31/2016)   Nephrotic syndrome    Obesity    BMI 44, weight 346 pounds 01/30/14   OSA (obstructive sleep apnea) 02/28/2018   Mild obstructive sleep apnea with an AHI of 9.8/h but severe during REM sleep with an AHI of 33.8/h.  Oxygen  saturations dropped to 86% and there was moderate snoring   Peripheral edema    Pneumonia    Prosthesis adjustment 08/17/2016   Pulmonary embolism (HCC) 09/2002   treated with 6 months of warfarin    Pyelonephritis 02/02/2018   Type I diabetes mellitus (HCC) dx'd 2001   Urinary retention    Past Surgical History:  Procedure Laterality Date   AMPUTATION Right 12/22/2013   Procedure: AMPUTATION BELOW KNEE;  Surgeon: Amada Backer, MD;  Location: [facility] OR;  Service: Orthopedics;  Laterality: Right;   AMPUTATION Right 02/19/2014   Procedure: RIGHT ABOVE KNEE AMPUTATION ;  Surgeon: Amada Backer, MD;  Location: [facility] OR;  Service: Orthopedics;  Laterality: Right;   blood clot removal  2016   CARDIAC CATHETERIZATION  02/18/2013   normal coronaries   ESOPHAGOGASTRODUODENOSCOPY N/A 11/02/2016   Procedure: ESOPHAGOGASTRODUODENOSCOPY (EGD);  Surgeon: Kenney Peacemaker, MD;  Location: [facility] ENDOSCOPY;  Service: Endoscopy;  Laterality: N/A;   I & D EXTREMITY Right 12/14/2013   Procedure: IRRIGATION AND DEBRIDEMENT RIGHT FOOT;  Surgeon: Lorriane Rote, MD;  Location: [facility] OR;  Service: Orthopedics;  Laterality: Right;   INCISION AND DRAINAGE ABSCESS  2007; 2015   "back"   INCISION AND DRAINAGE OF WOUND Right 12/22/2013   Procedure: I&D RIGHT BUTTOCK;  Surgeon: Amada Backer, MD;  Location: [facility] OR;  Service: Orthopedics;  Laterality: Right;   LEFT HEART CATHETERIZATION WITH CORONARY ANGIOGRAM N/A 02/18/2013   Procedure:  LEFT HEART CATHETERIZATION WITH CORONARY ANGIOGRAM;  Surgeon: Peter M Swaziland, MD;  Location: [facility] CATH LAB;  Service: Cardiovascular;  Laterality: N/A;   Patient Active Problem List   Diagnosis Date Noted   Tendinosis of right shoulder, possible supraspinatus tear 05/08/2023   Gout of right wrist 03/26/2023   Insomnia 03/26/2023   Volume overload 03/21/2023   Inflammatory arthropathy 02/23/2023   Medial epicondylitis of elbow, left 05/06/2022   Diabetic neuropathy (HCC) 01/24/2022   Benign paroxysmal positional vertigo 09/14/2020   Anemia associated with chronic renal failure    Hemoglobinuria 12/12/2018   OSA (obstructive sleep apnea) 02/28/2018   Acute on chronic diastolic heart failure (HCC)  04/54/0981   Preventative health care 08/15/2017   Erectile dysfunction 04/22/2017   History of pulmonary embolus (PE) 06/09/2016   Depression 05/04/2016   Hypogonadism in male 05/04/2016   Urinary retention 05/04/2016   Dyslipidemia 08/12/2014   Unilateral AKA, right (HCC) 12/26/2013   Hyperlipidemia 02/19/2013   S/P cardiac cath, 02/18/13, normal coronaries 02/19/2013   CKD (chronic kidney disease) stage 4, GFR 15-29 ml/min (HCC) 02/19/2013   Hypertension 07/24/2011   Long term current use of anticoagulant therapy 07/19/2011   History of DVT (deep vein thrombosis) 07/28/2009   Morbid obesity (HCC) 10/26/2005   Insulin  dependent type 2 diabetes mellitus, controlled (HCC) 01/09/2002    ONSET DATE: 04/26/23  REFERRING DIAG: M79.601 (ICD-10-CM) - Right arm pain  THERAPY DIAG:  Acute pain of right shoulder  Muscle weakness (generalized)  Unsteadiness on feet  Rationale for Evaluation and Treatment: Rehabilitation  SUBJECTIVE:   SUBJECTIVE STATEMENT: Pain 2/10 Rt shoulder Pt accompanied by: self  PERTINENT HISTORY: patient admitted on 4/17-4/22 for right arm swelling and pain concern for gouty arthritis and septic joint given hx of gouty arthritis in past, negative for crystals on arthrocentesis, cultures negative so empiric abx stopped. MRI of R shoulder noted tendinosis and possible small supraspinatus tear with mild subacromial/subdeltoid bursitis. Orthopedics rec sling and patient has outpatient f/u with them.  PMH:  R AKA 2016, hx of anemia, anxiety, CHF, chronic kidney disease disease, depression, hx of DVT and PE, GERD, OSA, type 1 diabetes, obesity, HTN, dyslipidemia  PRECAUTIONS: Fall  WEIGHT BEARING RESTRICTIONS: No  PAIN:  Are you having pain? Yes: NPRS scale: 3/10 today, 0-3/10 Pain location: top of shoulder  Pain description: achy/sore Aggravating factors: laying on it Relieving factors: topical gel, muscle relaxer  FALLS: Has patient fallen in last 6  months? No  LIVING ENVIRONMENT: Lives with: lives alone Stairs: No Has following equipment at home: Counselling psychologist, Environmental consultant - 2 wheeled, Wheelchair (manual), and Tour manager  PLOF: Independent with basic ADLs  PATIENT GOALS: get stronger, improve pain, and ROM  NEXT MD VISIT: not yet scheduled  OBJECTIVE:  Note: Objective measures were completed at Evaluation unless otherwise noted.  HAND DOMINANCE: Right  ADLs:   Uses prosthetic some with RW to ambulate, uses w/c most of the time Overall ADLs: Pt reports performing BADLs mod I, minimal pain with transfers/using walker.  Pt does have an aide that assists with some household tasks.   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 Pt enjoys reading, writing, work out, spend time with friends, cooking, outdoors, sporting events, fishing, axe throwing, dining out.     FUNCTIONAL OUTCOME MEASURES: Quick Dash: 9%  UPPER EXTREMITY ROM:   RUE grossly WNL, but pt notes some tightness at end ranges of shoulder movement   UPPER EXTREMITY  MMT:     MMT Right eval Left eval  Shoulder flexion 5/5 5/5  Shoulder abduction 4+/5 5/5  Shoulder adduction 5/5 5/5  Shoulder extension    Shoulder internal rotation    Shoulder external rotation    Middle trapezius    Lower trapezius    Elbow flexion 5/5 5/5  Elbow extension 5/5 5/5  Wrist flexion    Wrist extension    Wrist ulnar deviation    Wrist radial deviation    Wrist pronation    Wrist supination    (Blank rows = not tested)  HAND FUNCTION: Grip strength: Right: 42 lbs; Left: 60 lbs  COORDINATION: WNL  SENSATION: Pt reports numbness/tingling in thumb and 2-3rd digits, but controlled with meds.  EDEMA: none noted   COGNITION: Overall cognitive status: Within functional limits for tasks assessed  OBSERVATIONS: Pt very motivated for improvement.   TREATMENT DATE:   06/12/23:     Blood sugars = 112  Ultrasound x 8 minutes, 3  Mhz, 1.0 wts/cm2, 100% continuous over Rt shoulder and upper trap area for pain management  K-taping to relax supraspinatus, upper trap, and correction piece across shoulder (ant to posterior) to place sh in more neutral/ER position as humeral head tends to sit more anterior  Reviewed theraband HEP with focus on posture, going slower through entire movement w/ control during eccentric return, and not increasing resistance to prevent compensations (using incorrect muscle groups)  Pt shown more efficient, energy conservation technique for propelling w/c (semi circular technique)     PATIENT EDUCATION: Education details: see above Person educated: Patient Education method: Programmer, multimedia, Demonstration, Verbal cues, and Handouts Education comprehension: verbalized understanding, returned demonstration, and verbal cues required  HOME EXERCISE PROGRAM: 05/22/23:  HEP for shoulder stretch 06/05/23: Wrist HEP, theraband HEP   GOALS: Goals reviewed with patient? No  SHORT TERM GOALS: Target date: 06/22/23  Pt will be independent with initial HEP for R shoulder. Goal status: MET   2.  Pt will be independent with HEP for R wrist stretching and strength. Goal status: IN PROGRESS  3.  Pt will improve R grip strength to at least 6lbs to assist with ADLs/IADLs Baseline:  42lbs Goal status: IN PROGRESS  4.  Pt will verbalize understanding of proper positioning of R shoulder and wrist to decr risk of injury. Goal status: IN PROGRESS    LONG TERM GOALS: Target date: 07/06/23  Pt will be independent with updated HEP and appropriate community fitness activities. Baseline:  Goal status: INITIAL  2.  Pt will improve RUE functional use/pain as shown by improving Quick Dash to 4.5% or less Baseline: 9% Goal status: INITIAL  3.  Pt will improve R grip strength to at least 12lbs to assist with ADLs/IADLs Baseline:  42lbs Goal status: INITIAL    ASSESSMENT:  CLINICAL IMPRESSION: Patient  seen today for occupational therapy treatment for R shoulder pain, and strengthening. Pt progressing towards all STG's. Pt was hospitalized on 4/17-4/22/25 for right arm swelling and pain with concern for gouty arthritis and septic joint given hx of gouty arthritis in past, negative for crystals on arthrocentesis, cultures negative so empiric abx stopped. MRI of R shoulder noted tendinosis and possible small supraspinatus tear with mild subacromial/subdeltoid bursitis. Pt with PMH that also includes:  R AKA 2016, hx of anemia, anxiety, CHF, chronic kidney disease disease, depression, hx of DVT and PE, GERD, OSA, type 1 diabetes, obesity, HTN, dyslipidemia.  Pt requires UE use for w/c and walker mobility due  to hx of R AKA.  Pt presents today with RUE pain and weakness and would benefit from continued occupational therapy to address these for incr dominant RUE functional use, improved quality of life.  PERFORMANCE DEFICITS: in functional skills including ADLs, IADLs, strength, pain, and UE functional use, , and psychosocial skills including habits.   IMPAIRMENTS: are limiting patient from ADLs, IADLs, and leisure.   COMORBIDITIES: may have co-morbidities  that affects occupational performance. Patient will benefit from skilled OT to address above impairments and improve overall function.  MODIFICATION OR ASSISTANCE TO COMPLETE EVALUATION: Min-Moderate modification of tasks or assist with assess necessary to complete an evaluation.  OT OCCUPATIONAL PROFILE AND HISTORY: Detailed assessment: Review of records and additional review of physical, cognitive, psychosocial history related to current functional performance.  CLINICAL DECISION MAKING: Moderate - several treatment options, min-mod task modification necessary  REHAB POTENTIAL: Good  EVALUATION COMPLEXITY: Low      PLAN:  OT FREQUENCY: 2x/week  OT DURATION: 6 weeks +eval  PLANNED INTERVENTIONS: 97535 self care/ADL training, 82956  therapeutic exercise, 97530 therapeutic activity, 97112 neuromuscular re-education, 97140 manual therapy, 97035 ultrasound, 97018 paraffin, 21308 fluidotherapy, 97010 moist heat, 97010 cryotherapy, passive range of motion, functional mobility training, patient/family education, and DME and/or AE instructions  RECOMMENDED OTHER SERVICES: none at this time  CONSULTED AND AGREED WITH PLAN OF CARE: Patient  PLAN FOR NEXT SESSION: monitor blood sugar levels, work on wrist and hand strength, functional reaching with proper technique and posture   Velinda Getting, OTR/L 06/12/2023, 9:38 AM

## 2023-06-13 ENCOUNTER — Ambulatory Visit: Admitting: Occupational Therapy

## 2023-06-13 ENCOUNTER — Telehealth: Payer: Self-pay | Admitting: Student

## 2023-06-13 DIAGNOSIS — D631 Anemia in chronic kidney disease: Secondary | ICD-10-CM | POA: Diagnosis not present

## 2023-06-13 DIAGNOSIS — I130 Hypertensive heart and chronic kidney disease with heart failure and stage 1 through stage 4 chronic kidney disease, or unspecified chronic kidney disease: Secondary | ICD-10-CM | POA: Diagnosis not present

## 2023-06-13 DIAGNOSIS — N2581 Secondary hyperparathyroidism of renal origin: Secondary | ICD-10-CM | POA: Diagnosis not present

## 2023-06-13 DIAGNOSIS — R278 Other lack of coordination: Secondary | ICD-10-CM | POA: Diagnosis not present

## 2023-06-13 DIAGNOSIS — M6281 Muscle weakness (generalized): Secondary | ICD-10-CM

## 2023-06-13 DIAGNOSIS — R2681 Unsteadiness on feet: Secondary | ICD-10-CM

## 2023-06-13 DIAGNOSIS — Z794 Long term (current) use of insulin: Secondary | ICD-10-CM | POA: Diagnosis not present

## 2023-06-13 DIAGNOSIS — N184 Chronic kidney disease, stage 4 (severe): Secondary | ICD-10-CM | POA: Diagnosis not present

## 2023-06-13 DIAGNOSIS — I5030 Unspecified diastolic (congestive) heart failure: Secondary | ICD-10-CM | POA: Diagnosis not present

## 2023-06-13 DIAGNOSIS — E1122 Type 2 diabetes mellitus with diabetic chronic kidney disease: Secondary | ICD-10-CM | POA: Diagnosis not present

## 2023-06-13 DIAGNOSIS — M25511 Pain in right shoulder: Secondary | ICD-10-CM | POA: Diagnosis not present

## 2023-06-13 DIAGNOSIS — R29898 Other symptoms and signs involving the musculoskeletal system: Secondary | ICD-10-CM | POA: Diagnosis not present

## 2023-06-13 NOTE — Telephone Encounter (Signed)
 Message has been forwarded to [employer].  Please refer to message below.   Copied from CRM [id number]. Topic: General - Other >> Jun 13, 2023  1:14 PM Carrielelia G wrote: Reason for CRM: Attn: Liisa Reeves ... Please call regarding an event this weekend

## 2023-06-13 NOTE — Therapy (Signed)
 OUTPATIENT OCCUPATIONAL THERAPY ORTHO TREATMENT  Patient Name: [name] MRN: [id number] DOB:04-Sep-1975, 48 y.o., male Today's Date: 06/13/2023  PCP: Dr. Wilnette Haste REFERRING PROVIDER: Cherylene Corrente, MD  END OF SESSION:  OT End of Session - 06/13/23 1112     Visit Number 5    Number of Visits 13    Date for OT Re-Evaluation 07/06/23    Authorization Type [employer] Medicare, Dual covered 100%    Authorization - Number of Visits 10    Progress Note Due on Visit 10    OT Start Time 1110    OT Stop Time 1150    OT Time Calculation (min) 40 min    Activity Tolerance Patient tolerated treatment well    Behavior During Therapy WFL for tasks assessed/performed             Past Medical History:  Diagnosis Date   Acute venous embolism and thrombosis of deep vessels of proximal lower extremity (HCC) 07/19/2011   Anemia    Anginal pain (HCC)    pt denies   Anxiety    Chest pain, neg MI, normal coronaries by cath 02/18/2013   pt denies   CHF (congestive heart failure) (HCC)    Chronic renal disease, stage IV (HCC) 02/19/2013   Colon polyps    Depression    Diabetic ulcer of right foot (HCC)    DVT (deep venous thrombosis) (HCC) 09/2002   patient reports additional DVTs in '06 & '11 (unconfirmed)   ED (erectile dysfunction)    GERD (gastroesophageal reflux disease)    History of blood transfusion    "related to OR" (10/31/2016)   Hyperlipidemia 02/19/2013   Hypertension    Nausea & vomiting    "constant for the last couple weeks" (10/31/2016)   Nephrotic syndrome    Obesity    BMI 44, weight 346 pounds 01/30/14   OSA (obstructive sleep apnea) 02/28/2018   Mild obstructive sleep apnea with an AHI of 9.8/h but severe during REM sleep with an AHI of 33.8/h.  Oxygen  saturations dropped to 86% and there was moderate snoring   Peripheral edema    Pneumonia    Prosthesis adjustment 08/17/2016   Pulmonary embolism (HCC) 09/2002   treated with 6 months of warfarin    Pyelonephritis 02/02/2018   Type I diabetes mellitus (HCC) dx'd 2001   Urinary retention    Past Surgical History:  Procedure Laterality Date   AMPUTATION Right 12/22/2013   Procedure: AMPUTATION BELOW KNEE;  Surgeon: Amada Backer, MD;  Location: [facility] OR;  Service: Orthopedics;  Laterality: Right;   AMPUTATION Right 02/19/2014   Procedure: RIGHT ABOVE KNEE AMPUTATION ;  Surgeon: Amada Backer, MD;  Location: [facility] OR;  Service: Orthopedics;  Laterality: Right;   blood clot removal  2016   CARDIAC CATHETERIZATION  02/18/2013   normal coronaries   ESOPHAGOGASTRODUODENOSCOPY N/A 11/02/2016   Procedure: ESOPHAGOGASTRODUODENOSCOPY (EGD);  Surgeon: Kenney Peacemaker, MD;  Location: [facility] ENDOSCOPY;  Service: Endoscopy;  Laterality: N/A;   I & D EXTREMITY Right 12/14/2013   Procedure: IRRIGATION AND DEBRIDEMENT RIGHT FOOT;  Surgeon: Lorriane Rote, MD;  Location: [facility] OR;  Service: Orthopedics;  Laterality: Right;   INCISION AND DRAINAGE ABSCESS  2007; 2015   "back"   INCISION AND DRAINAGE OF WOUND Right 12/22/2013   Procedure: I&D RIGHT BUTTOCK;  Surgeon: Amada Backer, MD;  Location: [facility] OR;  Service: Orthopedics;  Laterality: Right;   LEFT HEART CATHETERIZATION WITH CORONARY ANGIOGRAM N/A 02/18/2013   Procedure:  LEFT HEART CATHETERIZATION WITH CORONARY ANGIOGRAM;  Surgeon: Peter M Swaziland, MD;  Location: [facility] CATH LAB;  Service: Cardiovascular;  Laterality: N/A;   Patient Active Problem List   Diagnosis Date Noted   Tendinosis of right shoulder, possible supraspinatus tear 05/08/2023   Gout of right wrist 03/26/2023   Insomnia 03/26/2023   Volume overload 03/21/2023   Inflammatory arthropathy 02/23/2023   Medial epicondylitis of elbow, left 05/06/2022   Diabetic neuropathy (HCC) 01/24/2022   Benign paroxysmal positional vertigo 09/14/2020   Anemia associated with chronic renal failure    Hemoglobinuria 12/12/2018   OSA (obstructive sleep apnea) 02/28/2018   Acute on chronic diastolic heart failure (HCC)  16/10/9602   Preventative health care 08/15/2017   Erectile dysfunction 04/22/2017   History of pulmonary embolus (PE) 06/09/2016   Depression 05/04/2016   Hypogonadism in male 05/04/2016   Urinary retention 05/04/2016   Dyslipidemia 08/12/2014   Unilateral AKA, right (HCC) 12/26/2013   Hyperlipidemia 02/19/2013   S/P cardiac cath, 02/18/13, normal coronaries 02/19/2013   CKD (chronic kidney disease) stage 4, GFR 15-29 ml/min (HCC) 02/19/2013   Hypertension 07/24/2011   Long term current use of anticoagulant therapy 07/19/2011   History of DVT (deep vein thrombosis) 07/28/2009   Morbid obesity (HCC) 10/26/2005   Insulin  dependent type 2 diabetes mellitus, controlled (HCC) 01/09/2002    ONSET DATE: 04/26/23  REFERRING DIAG: M79.601 (ICD-10-CM) - Right arm pain  THERAPY DIAG:  Acute pain of right shoulder  Muscle weakness (generalized)  Unsteadiness on feet  Rationale for Evaluation and Treatment: Rehabilitation  SUBJECTIVE:   SUBJECTIVE STATEMENT: Pain 0/10 Rt shoulder and Rt wrist. Pt reports taping helped and new way of propelling w/c seems a little better.  Pt accompanied by: self  PERTINENT HISTORY: patient admitted on 4/17-4/22 for right arm swelling and pain concern for gouty arthritis and septic joint given hx of gouty arthritis in past, negative for crystals on arthrocentesis, cultures negative so empiric abx stopped. MRI of R shoulder noted tendinosis and possible small supraspinatus tear with mild subacromial/subdeltoid bursitis. Orthopedics rec sling and patient has outpatient f/u with them.  PMH:  R AKA 2016, hx of anemia, anxiety, CHF, chronic kidney disease disease, depression, hx of DVT and PE, GERD, OSA, type 1 diabetes, obesity, HTN, dyslipidemia  PRECAUTIONS: Fall  WEIGHT BEARING RESTRICTIONS: No  PAIN:  Are you having pain? Yes: NPRS scale: 3/10 today, 0-3/10 Pain location: top of shoulder  Pain description: achy/sore Aggravating factors: laying on  it Relieving factors: topical gel, muscle relaxer  FALLS: Has patient fallen in last 6 months? No  LIVING ENVIRONMENT: Lives with: lives alone Stairs: No Has following equipment at home: Counselling psychologist, Environmental consultant - 2 wheeled, Wheelchair (manual), and Tour manager  PLOF: Independent with basic ADLs  PATIENT GOALS: get stronger, improve pain, and ROM  NEXT MD VISIT: not yet scheduled  OBJECTIVE:  Note: Objective measures were completed at Evaluation unless otherwise noted.  HAND DOMINANCE: Right  ADLs:   Uses prosthetic some with RW to ambulate, uses w/c most of the time Overall ADLs: Pt reports performing BADLs mod I, minimal pain with transfers/using walker.  Pt does have an aide that assists with some household tasks.   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 Pt enjoys reading, writing, work out, spend time with friends, cooking, outdoors, sporting events, fishing, axe throwing, dining out.     FUNCTIONAL OUTCOME MEASURES: Quick Dash: 9%  UPPER EXTREMITY ROM:  RUE grossly WNL, but pt notes some tightness at end ranges of shoulder movement   UPPER EXTREMITY MMT:     MMT Right eval Left eval  Shoulder flexion 5/5 5/5  Shoulder abduction 4+/5 5/5  Shoulder adduction 5/5 5/5  Shoulder extension    Shoulder internal rotation    Shoulder external rotation    Middle trapezius    Lower trapezius    Elbow flexion 5/5 5/5  Elbow extension 5/5 5/5  Wrist flexion    Wrist extension    Wrist ulnar deviation    Wrist radial deviation    Wrist pronation    Wrist supination    (Blank rows = not tested)  HAND FUNCTION: Grip strength: Right: 42 lbs; Left: 60 lbs  COORDINATION: WNL  SENSATION: Pt reports numbness/tingling in thumb and 2-3rd digits, but controlled with meds.  EDEMA: none noted   COGNITION: Overall cognitive status: Within functional limits for tasks assessed  OBSERVATIONS: Pt very motivated for  improvement.   TREATMENT DATE:   06/13/23:     Blood sugars = 98  Verbally reviewed putty HEP and wrist isometric ex's  Wrist flex, ext, and RD each x 10-15 reps with 3 lb weight RUE.   Power web for grip strength x 15 reps (more blue side resistance)   Wrist flex and ext with red (20 lbs) flex bar x 2 min for strength and endurance of affected extremity  Supination with red (20 lbs) flex bar x 2 min for strength and endurance of affected extremity  Pronation with red (20 lbs) flex bar x 2 min for strength and endurance of affected extremity  Gripper set at level 2 resistance to pick up blocks Rt hand for sustained grip strength, graded control, and functional use of RUE w/ min difficulty  Discussed posture and shoulder positioning when walking on treadmill to prevent sh pain. Also shown built up handle adaptation to help support wrist further  Seated: zoom ball for horizontal abduction, added resistance with yellow band around UE's    PATIENT EDUCATION: Education details: see above Person educated: Patient Education method: Programmer, multimedia, Demonstration, Verbal cues, and Handouts Education comprehension: verbalized understanding, returned demonstration, and verbal cues required  HOME EXERCISE PROGRAM: 05/22/23:  HEP for shoulder stretch 06/05/23: Wrist HEP, theraband HEP   GOALS: Goals reviewed with patient? No  SHORT TERM GOALS: Target date: 06/22/23  Pt will be independent with initial HEP for R shoulder. Goal status: MET   2.  Pt will be independent with HEP for R wrist stretching and strength. Goal status: MET   3.  Pt will improve R grip strength to at least 6lbs to assist with ADLs/IADLs Baseline:  42lbs Goal status: IN PROGRESS  4.  Pt will verbalize understanding of proper positioning of R shoulder and wrist to decr risk of injury. Goal status: MET     LONG TERM GOALS: Target date: 07/06/23  Pt will be independent with updated HEP and appropriate community  fitness activities. Baseline:  Goal status: INITIAL  2.  Pt will improve RUE functional use/pain as shown by improving Quick Dash to 4.5% or less Baseline: 9% Goal status: INITIAL  3.  Pt will improve R grip strength to at least 12lbs to assist with ADLs/IADLs Baseline:  42lbs Goal status: INITIAL    ASSESSMENT:  CLINICAL IMPRESSION: Patient seen today for occupational therapy treatment for R shoulder pain, and strengthening. Pt progressing towards all STG's. Pt was hospitalized on 4/17-4/22/25 for right arm swelling and pain  with concern for gouty arthritis and septic joint given hx of gouty arthritis in past, negative for crystals on arthrocentesis, cultures negative so empiric abx stopped. MRI of R shoulder noted tendinosis and possible small supraspinatus tear with mild subacromial/subdeltoid bursitis. Pt with PMH that also includes:  R AKA 2016, hx of anemia, anxiety, CHF, chronic kidney disease disease, depression, hx of DVT and PE, GERD, OSA, type 1 diabetes, obesity, HTN, dyslipidemia.  Pt requires UE use for w/c and walker mobility due to hx of R AKA.  Pt presents today with RUE pain and weakness and would benefit from continued occupational therapy to address these for incr dominant RUE functional use, improved quality of life.  PERFORMANCE DEFICITS: in functional skills including ADLs, IADLs, strength, pain, and UE functional use, , and psychosocial skills including habits.   IMPAIRMENTS: are limiting patient from ADLs, IADLs, and leisure.   COMORBIDITIES: may have co-morbidities  that affects occupational performance. Patient will benefit from skilled OT to address above impairments and improve overall function.  MODIFICATION OR ASSISTANCE TO COMPLETE EVALUATION: Min-Moderate modification of tasks or assist with assess necessary to complete an evaluation.  OT OCCUPATIONAL PROFILE AND HISTORY: Detailed assessment: Review of records and additional review of physical, cognitive,  psychosocial history related to current functional performance.  CLINICAL DECISION MAKING: Moderate - several treatment options, min-mod task modification necessary  REHAB POTENTIAL: Good  EVALUATION COMPLEXITY: Low      PLAN:  OT FREQUENCY: 2x/week  OT DURATION: 6 weeks +eval  PLANNED INTERVENTIONS: 97535 self care/ADL training, 01093 therapeutic exercise, 97530 therapeutic activity, 97112 neuromuscular re-education, 97140 manual therapy, 97035 ultrasound, 97018 paraffin, 23557 fluidotherapy, 97010 moist heat, 97010 cryotherapy, passive range of motion, functional mobility training, patient/family education, and DME and/or AE instructions  RECOMMENDED OTHER SERVICES: none at this time  CONSULTED AND AGREED WITH PLAN OF CARE: Patient  PLAN FOR NEXT SESSION: monitor blood sugar levels, US  prn, re-tape shoulder, progress towards remaining goals  Velinda Getting, OTR/L 06/13/2023, 11:13 AM

## 2023-06-13 NOTE — Telephone Encounter (Signed)
 Returned call

## 2023-06-19 ENCOUNTER — Encounter: Payer: Self-pay | Admitting: Occupational Therapy

## 2023-06-19 ENCOUNTER — Ambulatory Visit: Admitting: Occupational Therapy

## 2023-06-19 DIAGNOSIS — M6281 Muscle weakness (generalized): Secondary | ICD-10-CM

## 2023-06-19 DIAGNOSIS — M25511 Pain in right shoulder: Secondary | ICD-10-CM | POA: Diagnosis not present

## 2023-06-19 DIAGNOSIS — R278 Other lack of coordination: Secondary | ICD-10-CM | POA: Diagnosis not present

## 2023-06-19 DIAGNOSIS — R2681 Unsteadiness on feet: Secondary | ICD-10-CM | POA: Diagnosis not present

## 2023-06-19 DIAGNOSIS — R29898 Other symptoms and signs involving the musculoskeletal system: Secondary | ICD-10-CM | POA: Diagnosis not present

## 2023-06-19 NOTE — Therapy (Signed)
 OUTPATIENT OCCUPATIONAL THERAPY ORTHO TREATMENT  Patient Name: [name] MRN: [id number] DOB:December 14, 1975, 48 y.o., male Today's Date: 06/19/2023  PCP: Dr. Wilnette Haste REFERRING PROVIDER: Cherylene Corrente, MD  END OF SESSION:  OT End of Session - 06/19/23 0939     Visit Number 6    Number of Visits 13    Date for OT Re-Evaluation 07/06/23    Authorization Type [employer] Medicare, Dual covered 100%    Authorization - Number of Visits 10    Progress Note Due on Visit 10    OT Start Time 0935    OT Stop Time 1015    OT Time Calculation (min) 40 min    Activity Tolerance Patient tolerated treatment well    Behavior During Therapy [facility] for tasks assessed/performed             Past Medical History:  Diagnosis Date   Acute venous embolism and thrombosis of deep vessels of proximal lower extremity (HCC) 07/19/2011   Anemia    Anginal pain (HCC)    pt denies   Anxiety    Chest pain, neg MI, normal coronaries by cath 02/18/2013   pt denies   CHF (congestive heart failure) (HCC)    Chronic renal disease, stage IV (HCC) 02/19/2013   Colon polyps    Depression    Diabetic ulcer of right foot (HCC)    DVT (deep venous thrombosis) (HCC) 09/2002   patient reports additional DVTs in '06 & '11 (unconfirmed)   ED (erectile dysfunction)    GERD (gastroesophageal reflux disease)    History of blood transfusion    "related to OR" (10/31/2016)   Hyperlipidemia 02/19/2013   Hypertension    Nausea & vomiting    "constant for the last couple weeks" (10/31/2016)   Nephrotic syndrome    Obesity    BMI 44, weight 346 pounds 01/30/14   OSA (obstructive sleep apnea) 02/28/2018   Mild obstructive sleep apnea with an AHI of 9.8/h but severe during REM sleep with an AHI of 33.8/h.  Oxygen  saturations dropped to 86% and there was moderate snoring   Peripheral edema    Pneumonia    Prosthesis adjustment 08/17/2016   Pulmonary embolism (HCC) 09/2002   treated with 6 months of warfarin    Pyelonephritis 02/02/2018   Type I diabetes mellitus (HCC) dx'd 2001   Urinary retention    Past Surgical History:  Procedure Laterality Date   AMPUTATION Right 12/22/2013   Procedure: AMPUTATION BELOW KNEE;  Surgeon: Amada Backer, MD;  Location: [facility] OR;  Service: Orthopedics;  Laterality: Right;   AMPUTATION Right 02/19/2014   Procedure: RIGHT ABOVE KNEE AMPUTATION ;  Surgeon: Amada Backer, MD;  Location: [facility] OR;  Service: Orthopedics;  Laterality: Right;   blood clot removal  2016   CARDIAC CATHETERIZATION  02/18/2013   normal coronaries   ESOPHAGOGASTRODUODENOSCOPY N/A 11/02/2016   Procedure: ESOPHAGOGASTRODUODENOSCOPY (EGD);  Surgeon: Kenney Peacemaker, MD;  Location: [facility] ENDOSCOPY;  Service: Endoscopy;  Laterality: N/A;   I & D EXTREMITY Right 12/14/2013   Procedure: IRRIGATION AND DEBRIDEMENT RIGHT FOOT;  Surgeon: Lorriane Rote, MD;  Location: [facility] OR;  Service: Orthopedics;  Laterality: Right;   INCISION AND DRAINAGE ABSCESS  2007; 2015   "back"   INCISION AND DRAINAGE OF WOUND Right 12/22/2013   Procedure: I&D RIGHT BUTTOCK;  Surgeon: Amada Backer, MD;  Location: [facility] OR;  Service: Orthopedics;  Laterality: Right;   LEFT HEART CATHETERIZATION WITH CORONARY ANGIOGRAM N/A 02/18/2013   Procedure:  LEFT HEART CATHETERIZATION WITH CORONARY ANGIOGRAM;  Surgeon: Peter M Swaziland, MD;  Location: [facility] CATH LAB;  Service: Cardiovascular;  Laterality: N/A;   Patient Active Problem List   Diagnosis Date Noted   Tendinosis of right shoulder, possible supraspinatus tear 05/08/2023   Gout of right wrist 03/26/2023   Insomnia 03/26/2023   Volume overload 03/21/2023   Inflammatory arthropathy 02/23/2023   Medial epicondylitis of elbow, left 05/06/2022   Diabetic neuropathy (HCC) 01/24/2022   Benign paroxysmal positional vertigo 09/14/2020   Anemia associated with chronic renal failure    Hemoglobinuria 12/12/2018   OSA (obstructive sleep apnea) 02/28/2018   Acute on chronic diastolic heart failure (HCC)  16/10/9602   Preventative health care 08/15/2017   Erectile dysfunction 04/22/2017   History of pulmonary embolus (PE) 06/09/2016   Depression 05/04/2016   Hypogonadism in male 05/04/2016   Urinary retention 05/04/2016   Dyslipidemia 08/12/2014   Unilateral AKA, right (HCC) 12/26/2013   Hyperlipidemia 02/19/2013   S/P cardiac cath, 02/18/13, normal coronaries 02/19/2013   CKD (chronic kidney disease) stage 4, GFR 15-29 ml/min (HCC) 02/19/2013   Hypertension 07/24/2011   Long term current use of anticoagulant therapy 07/19/2011   History of DVT (deep vein thrombosis) 07/28/2009   Morbid obesity (HCC) 10/26/2005   Insulin  dependent type 2 diabetes mellitus, controlled (HCC) 01/09/2002    ONSET DATE: 04/26/23  REFERRING DIAG: M79.601 (ICD-10-CM) - Right arm pain  THERAPY DIAG:  Acute pain of right shoulder  Muscle weakness (generalized)  Rationale for Evaluation and Treatment: Rehabilitation  SUBJECTIVE:   SUBJECTIVE STATEMENT: A little pain today, not much Pt accompanied by: self  PERTINENT HISTORY: patient admitted on 4/17-4/22 for right arm swelling and pain concern for gouty arthritis and septic joint given hx of gouty arthritis in past, negative for crystals on arthrocentesis, cultures negative so empiric abx stopped. MRI of R shoulder noted tendinosis and possible small supraspinatus tear with mild subacromial/subdeltoid bursitis. Orthopedics rec sling and patient has outpatient f/u with them.  PMH:  R AKA 2016, hx of anemia, anxiety, CHF, chronic kidney disease disease, depression, hx of DVT and PE, GERD, OSA, type 1 diabetes, obesity, HTN, dyslipidemia  PRECAUTIONS: Fall  WEIGHT BEARING RESTRICTIONS: No  PAIN:  Are you having pain? Yes: NPRS scale: 2/10 today, 0-3/10 Pain location: top of shoulder  Pain description: achy/sore Aggravating factors: laying on it Relieving factors: topical gel, muscle relaxer  FALLS: Has patient fallen in last 6 months? No  LIVING  ENVIRONMENT: Lives with: lives alone Stairs: No Has following equipment at home: Counselling psychologist, Environmental consultant - 2 wheeled, Wheelchair (manual), and Tour manager  PLOF: Independent with basic ADLs  PATIENT GOALS: get stronger, improve pain, and ROM  NEXT MD VISIT: not yet scheduled  OBJECTIVE:  Note: Objective measures were completed at Evaluation unless otherwise noted.  HAND DOMINANCE: Right  ADLs:   Uses prosthetic some with RW to ambulate, uses w/c most of the time Overall ADLs: Pt reports performing BADLs mod I, minimal pain with transfers/using walker.  Pt does have an aide that assists with some household tasks.   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 Pt enjoys reading, writing, work out, spend time with friends, cooking, outdoors, sporting events, fishing, axe throwing, dining out.     FUNCTIONAL OUTCOME MEASURES: Quick Dash: 9%  UPPER EXTREMITY ROM:   RUE grossly WNL, but pt notes some tightness at end ranges of shoulder movement   UPPER EXTREMITY MMT:  MMT Right eval Left eval  Shoulder flexion 5/5 5/5  Shoulder abduction 4+/5 5/5  Shoulder adduction 5/5 5/5  Shoulder extension    Shoulder internal rotation    Shoulder external rotation    Middle trapezius    Lower trapezius    Elbow flexion 5/5 5/5  Elbow extension 5/5 5/5  Wrist flexion    Wrist extension    Wrist ulnar deviation    Wrist radial deviation    Wrist pronation    Wrist supination    (Blank rows = not tested)  HAND FUNCTION: Grip strength: Right: 42 lbs; Left: 60 lbs  COORDINATION: WNL  SENSATION: Pt reports numbness/tingling in thumb and 2-3rd digits, but controlled with meds.  EDEMA: none noted   COGNITION: Overall cognitive status: Within functional limits for tasks assessed  OBSERVATIONS: Pt very motivated for improvement.   TREATMENT DATE:   06/19/23:     Blood sugars = 92  Ultrasound x 8 minutes, 3 Mhz, 1.0 wts/cm2, 100%  continuous over Rt shoulder and upper trap area for pain management   K-taping to relax supraspinatus, upper trap, and correction piece across shoulder (ant to posterior) to place sh in more neutral/ER position as humeral head tends to sit more anterior Gripper set at level 2 resistance to pick up blocks Rt hand for sustained grip strength, graded control, and functional use of RUE w/ min difficulty  Gripper set at level 3 resistance (black coil) to pick up blocks Rt hand for sustained grip strength, graded control, and functional use of RUE w/ min difficulty, mod drops  Re-iterated quality of movement over quantity and avoiding too much weight both for wrist and shoulder. Discussed eccentric control with strengthening     PATIENT EDUCATION: Education details: see above Person educated: Patient Education method: Explanation, Demonstration, Verbal cues, and Handouts Education comprehension: verbalized understanding, returned demonstration, and verbal cues required  HOME EXERCISE PROGRAM: 05/22/23:  HEP for shoulder stretch 06/05/23: Wrist HEP, theraband HEP   GOALS: Goals reviewed with patient? No  SHORT TERM GOALS: Target date: 06/22/23  Pt will be independent with initial HEP for R shoulder. Goal status: MET   2.  Pt will be independent with HEP for R wrist stretching and strength. Goal status: MET   3.  Pt will improve R grip strength to at least 6lbs to assist with ADLs/IADLs Baseline:  42lbs Goal status: IN PROGRESS  4.  Pt will verbalize understanding of proper positioning of R shoulder and wrist to decr risk of injury. Goal status: MET     LONG TERM GOALS: Target date: 07/06/23  Pt will be independent with updated HEP and appropriate community fitness activities. Baseline:  Goal status: IN POGRESS  2.  Pt will improve RUE functional use/pain as shown by improving Quick Dash to 4.5% or less Baseline: 9% Goal status: INITIAL  3.  Pt will improve R grip strength to  at least 12lbs to assist with ADLs/IADLs Baseline:  42lbs Goal status: IN PROGRESS    ASSESSMENT:  CLINICAL IMPRESSION: Patient seen today for occupational therapy treatment for R shoulder pain, and GRIP strengthening. Pt progressing has met most STG's and progressing towards LTGs. Pt was hospitalized on 4/17-4/22/25 for right arm swelling and pain with concern for gouty arthritis and septic joint given hx of gouty arthritis in past, negative for crystals on arthrocentesis, cultures negative so empiric abx stopped. MRI of R shoulder noted tendinosis and possible small supraspinatus tear with mild subacromial/subdeltoid bursitis. Pt with PMH that  also includes:  R AKA 2016, hx of anemia, anxiety, CHF, chronic kidney disease disease, depression, hx of DVT and PE, GERD, OSA, type 1 diabetes, obesity, HTN, dyslipidemia.  Pt requires UE use for w/c and walker mobility due to hx of R AKA.  Pt presents today with RUE pain and weakness and would benefit from continued occupational therapy to address these for incr dominant RUE functional use, improved quality of life.  PERFORMANCE DEFICITS: in functional skills including ADLs, IADLs, strength, pain, and UE functional use, , and psychosocial skills including habits.   IMPAIRMENTS: are limiting patient from ADLs, IADLs, and leisure.   COMORBIDITIES: may have co-morbidities  that affects occupational performance. Patient will benefit from skilled OT to address above impairments and improve overall function.  MODIFICATION OR ASSISTANCE TO COMPLETE EVALUATION: Min-Moderate modification of tasks or assist with assess necessary to complete an evaluation.  OT OCCUPATIONAL PROFILE AND HISTORY: Detailed assessment: Review of records and additional review of physical, cognitive, psychosocial history related to current functional performance.  CLINICAL DECISION MAKING: Moderate - several treatment options, min-mod task modification necessary  REHAB POTENTIAL:  Good  EVALUATION COMPLEXITY: Low      PLAN:  OT FREQUENCY: 2x/week  OT DURATION: 6 weeks +eval  PLANNED INTERVENTIONS: 97535 self care/ADL training, 46962 therapeutic exercise, 97530 therapeutic activity, 97112 neuromuscular re-education, 97140 manual therapy, 97035 ultrasound, 97018 paraffin, 95284 fluidotherapy, 97010 moist heat, 97010 cryotherapy, passive range of motion, functional mobility training, patient/family education, and DME and/or AE instructions  RECOMMENDED OTHER SERVICES: none at this time  CONSULTED AND AGREED WITH PLAN OF CARE: Patient  PLAN FOR NEXT SESSION: monitor blood sugar levels, US  prn, grip and wrist strengthening, progress towards remaining goals  (plan on videotaping K-tape application next week for continued carryover at home once d/c - anticipate d/c end of next week)   Velinda Getting, OTR/L 06/19/2023, 9:39 AM

## 2023-06-20 ENCOUNTER — Telehealth: Payer: Self-pay

## 2023-06-20 NOTE — Telephone Encounter (Signed)
 Patient was identified as falling into the [place] Measure - Diabetes.   Patient was: Appointment scheduled with primary care provider in the next 3 month from last office visit

## 2023-06-21 ENCOUNTER — Ambulatory Visit: Admitting: Occupational Therapy

## 2023-06-21 ENCOUNTER — Encounter: Payer: Self-pay | Admitting: Occupational Therapy

## 2023-06-21 DIAGNOSIS — M25511 Pain in right shoulder: Secondary | ICD-10-CM

## 2023-06-21 NOTE — Therapy (Signed)
 [facility] [address] [place], [place], [address] Phone: [phone]   Fax:  [phone]  Patient Details  Name: [name] MRN: [id number] Date of Birth: 11-23-1975  Encounter Date: 06/21/2023  Pt arrived ~ 1 hour earlier than scheduled appt via [employer] public transportation but OTR was not able to see him until his appt time.  When OT arrived to get him from lobby, he reported that his transportation was on their way to pick him up, therefore pt not seen for skilled OT services today.  He is aware of appt next week.   Zora Hires, OT 06/21/2023, 10:45 AM

## 2023-06-22 ENCOUNTER — Encounter (HOSPITAL_COMMUNITY)
Admission: RE | Admit: 2023-06-22 | Discharge: 2023-06-22 | Disposition: A | Discharge status: Home / Self Care | Source: Ambulatory Visit | Attending: Nephrology | Admitting: Nephrology

## 2023-06-22 VITALS — BP 139/67 | HR 86 | Temp 97.4°F | Resp 16

## 2023-06-22 DIAGNOSIS — N184 Chronic kidney disease, stage 4 (severe): Secondary | ICD-10-CM | POA: Diagnosis not present

## 2023-06-22 LAB — IRON AND TIBC
Iron: 49 ug/dL (ref 45–182)
Saturation Ratios: 17 % — ABNORMAL LOW (ref 17.9–39.5)
TIBC: 290 ug/dL (ref 250–450)
UIBC: 241 ug/dL

## 2023-06-22 LAB — RENAL FUNCTION PANEL
Albumin: 3.3 g/dL — ABNORMAL LOW (ref 3.5–5.0)
Anion gap: 10 (ref 5–15)
BUN: 72 mg/dL — ABNORMAL HIGH (ref 6–20)
CO2: 22 mmol/L (ref 22–32)
Calcium: 8.6 mg/dL — ABNORMAL LOW (ref 8.9–10.3)
Chloride: 105 mmol/L (ref 98–111)
Creatinine, Ser: 3.5 mg/dL — ABNORMAL HIGH (ref 0.61–1.24)
GFR, Estimated: 21 mL/min — ABNORMAL LOW (ref >60)
Glucose, Bld: 107 mg/dL — ABNORMAL HIGH (ref 70–99)
Phosphorus: 4.7 mg/dL — ABNORMAL HIGH (ref 2.5–4.6)
Potassium: 4.6 mmol/L (ref 3.5–5.1)
Sodium: 137 mmol/L (ref 135–145)

## 2023-06-22 LAB — FERRITIN: Ferritin: 199 ng/mL (ref 24–336)

## 2023-06-22 LAB — POCT HEMOGLOBIN-HEMACUE: Hemoglobin: 10.1 g/dL — ABNORMAL LOW (ref 13.0–17.0)

## 2023-06-22 MED ORDER — DARBEPOETIN ALFA 200 MCG/0.4ML IJ SOSY
200.0000 ug | PREFILLED_SYRINGE | INTRAMUSCULAR | Status: DC
  Administered 2023-06-22: 200 ug via SUBCUTANEOUS

## 2023-06-22 MED ORDER — DARBEPOETIN ALFA 200 MCG/0.4ML IJ SOSY
PREFILLED_SYRINGE | INTRAMUSCULAR | Status: AC
  Filled 2023-06-22: qty 0.4

## 2023-06-26 ENCOUNTER — Ambulatory Visit: Admitting: Occupational Therapy

## 2023-06-26 ENCOUNTER — Other Ambulatory Visit: Payer: Self-pay | Admitting: Student

## 2023-06-26 DIAGNOSIS — M25511 Pain in right shoulder: Secondary | ICD-10-CM | POA: Diagnosis not present

## 2023-06-26 DIAGNOSIS — R2681 Unsteadiness on feet: Secondary | ICD-10-CM | POA: Diagnosis not present

## 2023-06-26 DIAGNOSIS — R29898 Other symptoms and signs involving the musculoskeletal system: Secondary | ICD-10-CM | POA: Diagnosis not present

## 2023-06-26 DIAGNOSIS — R278 Other lack of coordination: Secondary | ICD-10-CM | POA: Diagnosis not present

## 2023-06-26 DIAGNOSIS — M6281 Muscle weakness (generalized): Secondary | ICD-10-CM | POA: Diagnosis not present

## 2023-06-26 DIAGNOSIS — I5032 Chronic diastolic (congestive) heart failure: Secondary | ICD-10-CM

## 2023-06-26 NOTE — Telephone Encounter (Signed)
 Medication sent to pharmacy

## 2023-06-26 NOTE — Therapy (Signed)
 OUTPATIENT OCCUPATIONAL THERAPY ORTHO TREATMENT  Patient Name: [name] MRN: [id number] DOB:06-16-1975, 48 y.o., male Today's Date: 06/26/2023  PCP: Dr. Wilnette Haste REFERRING PROVIDER: Cherylene Corrente, MD  END OF SESSION:  OT End of Session - 06/26/23 0940     Visit Number 7    Number of Visits 13    Date for OT Re-Evaluation 07/06/23    Authorization Type [employer] Medicare, Dual covered 100%    Authorization - Number of Visits 10    Progress Note Due on Visit 10    OT Start Time 0935    OT Stop Time 1015    OT Time Calculation (min) 40 min    Activity Tolerance Patient tolerated treatment well    Behavior During Therapy [facility] for tasks assessed/performed          Past Medical History:  Diagnosis Date   Acute venous embolism and thrombosis of deep vessels of proximal lower extremity (HCC) 07/19/2011   Anemia    Anginal pain (HCC)    pt denies   Anxiety    Chest pain, neg MI, normal coronaries by cath 02/18/2013   pt denies   CHF (congestive heart failure) (HCC)    Chronic renal disease, stage IV (HCC) 02/19/2013   Colon polyps    Depression    Diabetic ulcer of right foot (HCC)    DVT (deep venous thrombosis) (HCC) 09/2002   patient reports additional DVTs in '06 & '11 (unconfirmed)   ED (erectile dysfunction)    GERD (gastroesophageal reflux disease)    History of blood transfusion    related to OR (10/31/2016)   Hyperlipidemia 02/19/2013   Hypertension    Nausea & vomiting    constant for the last couple weeks (10/31/2016)   Nephrotic syndrome    Obesity    BMI 44, weight 346 pounds 01/30/14   OSA (obstructive sleep apnea) 02/28/2018   Mild obstructive sleep apnea with an AHI of 9.8/h but severe during REM sleep with an AHI of 33.8/h.  Oxygen  saturations dropped to 86% and there was moderate snoring   Peripheral edema    Pneumonia    Prosthesis adjustment 08/17/2016   Pulmonary embolism (HCC) 09/2002   treated with 6 months of warfarin    Pyelonephritis 02/02/2018   Type I diabetes mellitus (HCC) dx'd 2001   Urinary retention    Past Surgical History:  Procedure Laterality Date   AMPUTATION Right 12/22/2013   Procedure: AMPUTATION BELOW KNEE;  Surgeon: Amada Backer, MD;  Location: [facility] OR;  Service: Orthopedics;  Laterality: Right;   AMPUTATION Right 02/19/2014   Procedure: RIGHT ABOVE KNEE AMPUTATION ;  Surgeon: Amada Backer, MD;  Location: [facility] OR;  Service: Orthopedics;  Laterality: Right;   blood clot removal  2016   CARDIAC CATHETERIZATION  02/18/2013   normal coronaries   ESOPHAGOGASTRODUODENOSCOPY N/A 11/02/2016   Procedure: ESOPHAGOGASTRODUODENOSCOPY (EGD);  Surgeon: Kenney Peacemaker, MD;  Location: [facility] ENDOSCOPY;  Service: Endoscopy;  Laterality: N/A;   I & D EXTREMITY Right 12/14/2013   Procedure: IRRIGATION AND DEBRIDEMENT RIGHT FOOT;  Surgeon: Lorriane Rote, MD;  Location: [facility] OR;  Service: Orthopedics;  Laterality: Right;   INCISION AND DRAINAGE ABSCESS  2007; 2015   back   INCISION AND DRAINAGE OF WOUND Right 12/22/2013   Procedure: I&D RIGHT BUTTOCK;  Surgeon: Amada Backer, MD;  Location: [facility] OR;  Service: Orthopedics;  Laterality: Right;   LEFT HEART CATHETERIZATION WITH CORONARY ANGIOGRAM N/A 02/18/2013   Procedure: LEFT HEART CATHETERIZATION  WITH CORONARY ANGIOGRAM;  Surgeon: Peter M Swaziland, MD;  Location: [facility] CATH LAB;  Service: Cardiovascular;  Laterality: N/A;   Patient Active Problem List   Diagnosis Date Noted   Tendinosis of right shoulder, possible supraspinatus tear 05/08/2023   Gout of right wrist 03/26/2023   Insomnia 03/26/2023   Volume overload 03/21/2023   Inflammatory arthropathy 02/23/2023   Medial epicondylitis of elbow, left 05/06/2022   Diabetic neuropathy (HCC) 01/24/2022   Benign paroxysmal positional vertigo 09/14/2020   Anemia associated with chronic renal failure    Hemoglobinuria 12/12/2018   OSA (obstructive sleep apnea) 02/28/2018   Acute on chronic diastolic heart failure (HCC)  16/10/9602   Preventative health care 08/15/2017   Erectile dysfunction 04/22/2017   History of pulmonary embolus (PE) 06/09/2016   Depression 05/04/2016   Hypogonadism in male 05/04/2016   Urinary retention 05/04/2016   Dyslipidemia 08/12/2014   Unilateral AKA, right (HCC) 12/26/2013   Hyperlipidemia 02/19/2013   S/P cardiac cath, 02/18/13, normal coronaries 02/19/2013   CKD (chronic kidney disease) stage 4, GFR 15-29 ml/min (HCC) 02/19/2013   Hypertension 07/24/2011   Long term current use of anticoagulant therapy 07/19/2011   History of DVT (deep vein thrombosis) 07/28/2009   Morbid obesity (HCC) 10/26/2005   Insulin  dependent type 2 diabetes mellitus, controlled (HCC) 01/09/2002    ONSET DATE: 04/26/23  REFERRING DIAG: M79.601 (ICD-10-CM) - Right arm pain  THERAPY DIAG:  Acute pain of right shoulder  Muscle weakness (generalized)  Unsteadiness on feet  Rationale for Evaluation and Treatment: Rehabilitation  SUBJECTIVE:   SUBJECTIVE STATEMENT: A little pain today, not much Pt accompanied by: self  PERTINENT HISTORY: patient admitted on 4/17-4/22 for right arm swelling and pain concern for gouty arthritis and septic joint given hx of gouty arthritis in past, negative for crystals on arthrocentesis, cultures negative so empiric abx stopped. MRI of R shoulder noted tendinosis and possible small supraspinatus tear with mild subacromial/subdeltoid bursitis. Orthopedics rec sling and patient has outpatient f/u with them.  PMH:  R AKA 2016, hx of anemia, anxiety, CHF, chronic kidney disease disease, depression, hx of DVT and PE, GERD, OSA, type 1 diabetes, obesity, HTN, dyslipidemia  PRECAUTIONS: Fall  WEIGHT BEARING RESTRICTIONS: No  PAIN:  Are you having pain? Yes: NPRS scale: 2/10 today, 0-3/10 Pain location: top of shoulder  Pain description: achy/sore Aggravating factors: laying on it Relieving factors: topical gel, muscle relaxer  FALLS: Has patient fallen in last  6 months? No  LIVING ENVIRONMENT: Lives with: lives alone Stairs: No Has following equipment at home: Counselling psychologist, Environmental consultant - 2 wheeled, Wheelchair (manual), and Tour manager  PLOF: Independent with basic ADLs  PATIENT GOALS: get stronger, improve pain, and ROM  NEXT MD VISIT: not yet scheduled  OBJECTIVE:  Note: Objective measures were completed at Evaluation unless otherwise noted.  HAND DOMINANCE: Right  ADLs:   Uses prosthetic some with RW to ambulate, uses w/c most of the time Overall ADLs: Pt reports performing BADLs mod I, minimal pain with transfers/using walker.  Pt does have an aide that assists with some household tasks.   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 Pt enjoys reading, writing, work out, spend time with friends, cooking, outdoors, sporting events, fishing, axe throwing, dining out.     FUNCTIONAL OUTCOME MEASURES: Quick Dash: 9%  UPPER EXTREMITY ROM:   RUE grossly WNL, but pt notes some tightness at end ranges of shoulder movement   UPPER EXTREMITY MMT:  MMT Right eval Left eval  Shoulder flexion 5/5 5/5  Shoulder abduction 4+/5 5/5  Shoulder adduction 5/5 5/5  Shoulder extension    Shoulder internal rotation    Shoulder external rotation    Middle trapezius    Lower trapezius    Elbow flexion 5/5 5/5  Elbow extension 5/5 5/5  Wrist flexion    Wrist extension    Wrist ulnar deviation    Wrist radial deviation    Wrist pronation    Wrist supination    (Blank rows = not tested)  HAND FUNCTION: Grip strength: Right: 42 lbs; Left: 60 lbs  COORDINATION: WNL  SENSATION: Pt reports numbness/tingling in thumb and 2-3rd digits, but controlled with meds.  EDEMA: none noted   COGNITION: Overall cognitive status: Within functional limits for tasks assessed  OBSERVATIONS: Pt very motivated for improvement.   TREATMENT DATE:   06/26/23:     Blood sugars = 160   K-taping to relax  supraspinatus, upper trap, and correction piece across shoulder (ant to posterior) to place sh in more neutral/ER position as humeral head tends to sit more anterior. Videotaped proper application with pt's phone so he can have family/friend continue as needed once d/c.   Gripper set at level 3 resistance (black coil) to pick up blocks Rt hand for sustained grip strength, graded control, and functional use of RUE w/ min difficulty, mod drops  Pt issued wrist strengthening HEP - see pt instructions to details. Pt performed each x 10-15  reps. Re-iterated quality of movement over quantity and avoiding too much weight both for wrist and shoulder. Discussed eccentric control with strengthening   Pt reports new numbness in first 3 digits of Rt hand but goes up into forearm - pt reports this is new. Pt instructed to avoid sustained and repetitive gripping and stretch wrist/hand b/t ex's. However, if this continues, pt instructed to consult MD.   PATIENT EDUCATION: Education details: see above Person educated: Patient Education method: Explanation, Demonstration, Verbal cues, and Handouts Education comprehension: verbalized understanding, returned demonstration, and verbal cues required  HOME EXERCISE PROGRAM: 05/22/23:  HEP for shoulder stretch 06/05/23: Wrist HEP, theraband HEP   GOALS: Goals reviewed with patient? No  SHORT TERM GOALS: Target date: 06/22/23  Pt will be independent with initial HEP for R shoulder. Goal status: MET   2.  Pt will be independent with HEP for R wrist stretching and strength. Goal status: MET   3.  Pt will improve R grip strength to at least 6lbs to assist with ADLs/IADLs Baseline:  42lbs Goal status: IN PROGRESS  4.  Pt will verbalize understanding of proper positioning of R shoulder and wrist to decr risk of injury. Goal status: MET     LONG TERM GOALS: Target date: 07/06/23  Pt will be independent with updated HEP and appropriate community fitness  activities. Baseline:  Goal status: IN POGRESS  2.  Pt will improve RUE functional use/pain as shown by improving Quick Dash to 4.5% or less Baseline: 9% Goal status: INITIAL  3.  Pt will improve R grip strength to at least 12lbs to assist with ADLs/IADLs Baseline:  42lbs Goal status: IN PROGRESS    ASSESSMENT:  CLINICAL IMPRESSION: Patient seen today for occupational therapy treatment for R shoulder pain, and wrist and hand strengthening. Pt reports new numbness in hand (prior to this treatment session). Pt progressing has met most STG's and progressing towards LTGs.   PERFORMANCE DEFICITS: in functional skills including ADLs, IADLs, strength,  pain, and UE functional use, , and psychosocial skills including habits.   IMPAIRMENTS: are limiting patient from ADLs, IADLs, and leisure.   COMORBIDITIES: may have co-morbidities  that affects occupational performance. Patient will benefit from skilled OT to address above impairments and improve overall function.  MODIFICATION OR ASSISTANCE TO COMPLETE EVALUATION: Min-Moderate modification of tasks or assist with assess necessary to complete an evaluation.  OT OCCUPATIONAL PROFILE AND HISTORY: Detailed assessment: Review of records and additional review of physical, cognitive, psychosocial history related to current functional performance.  CLINICAL DECISION MAKING: Moderate - several treatment options, min-mod task modification necessary  REHAB POTENTIAL: Good  EVALUATION COMPLEXITY: Low      PLAN:  OT FREQUENCY: 2x/week  OT DURATION: 6 weeks +eval  PLANNED INTERVENTIONS: 97535 self care/ADL training, 16109 therapeutic exercise, 97530 therapeutic activity, 97112 neuromuscular re-education, 97140 manual therapy, 97035 ultrasound, 97018 paraffin, 60454 fluidotherapy, 97010 moist heat, 97010 cryotherapy, passive range of motion, functional mobility training, patient/family education, and DME and/or AE instructions  RECOMMENDED  OTHER SERVICES: none at this time  CONSULTED AND AGREED WITH PLAN OF CARE: Patient  PLAN FOR NEXT SESSION: monitor blood sugar levels, US  prn for shoulder, grip and wrist strengthening, progress towards remaining goals  D/C by 07/05/23  Velinda Getting, OTR/L 06/26/2023, 9:42 AM

## 2023-06-26 NOTE — Patient Instructions (Signed)
 Wrist Extension: Resisted    With right palm down, __3__ pound weight in hand, bend wrist up. Return slowly. Repeat __10__ times per set.  Do _2___ sessions per day, every other day.   Flexion (Resistive)    With hand palm-up and holding _3 pound weight, bend hand toward you at wrist.  Relax slowly. Repeat __10__ times. Do __2__ sessions per day, every other day.    Wrist Radial Deviation: Resisted    With right thumb up, _3___ pound weight in hand, bend wrist up. Return slowly. Repeat __10__ times per set.  Do __2__ sessions per day, every other day.

## 2023-06-27 NOTE — Patient Outreach (Signed)
 Complex Care Management   Visit Note  06/27/2023  Name:  [name] MRN: [id number] DOB: 1975-11-11  Situation: Referral received for Complex Care Management related to Heart Failure and gout. Call attempt #3 to reach patient for CCM follow-up visit. No answer.  Background:   Past Medical History:  Diagnosis Date   Acute venous embolism and thrombosis of deep vessels of proximal lower extremity (HCC) 07/19/2011   Anemia    Anginal pain (HCC)    pt denies   Anxiety    Chest pain, neg MI, normal coronaries by cath 02/18/2013   pt denies   CHF (congestive heart failure) (HCC)    Chronic renal disease, stage IV (HCC) 02/19/2013   Colon polyps    Depression    Diabetic ulcer of right foot (HCC)    DVT (deep venous thrombosis) (HCC) 09/2002   patient reports additional DVTs in '06 & '11 (unconfirmed)   ED (erectile dysfunction)    GERD (gastroesophageal reflux disease)    History of blood transfusion    related to OR (10/31/2016)   Hyperlipidemia 02/19/2013   Hypertension    Nausea & vomiting    constant for the last couple weeks (10/31/2016)   Nephrotic syndrome    Obesity    BMI 44, weight 346 pounds 01/30/14   OSA (obstructive sleep apnea) 02/28/2018   Mild obstructive sleep apnea with an AHI of 9.8/h but severe during REM sleep with an AHI of 33.8/h.  Oxygen  saturations dropped to 86% and there was moderate snoring   Peripheral edema    Pneumonia    Prosthesis adjustment 08/17/2016   Pulmonary embolism (HCC) 09/2002   treated with 6 months of warfarin   Pyelonephritis 02/02/2018   Type I diabetes mellitus (HCC) dx'd 2001   Urinary retention     Recommendation:   Continue Current Plan of Care as established by provider  Follow Up Plan:   We have been unable to make contact with the patient x 3 attempts. Closing from Complex Care Management  Theodora Fish, RN MSN [place]  [facility] RN Care Manager Direct Dial: [phone]  Fax:  [phone]

## 2023-06-28 ENCOUNTER — Ambulatory Visit: Admitting: Occupational Therapy

## 2023-06-28 DIAGNOSIS — R2681 Unsteadiness on feet: Secondary | ICD-10-CM | POA: Diagnosis not present

## 2023-06-28 DIAGNOSIS — R278 Other lack of coordination: Secondary | ICD-10-CM

## 2023-06-28 DIAGNOSIS — R29898 Other symptoms and signs involving the musculoskeletal system: Secondary | ICD-10-CM | POA: Diagnosis not present

## 2023-06-28 DIAGNOSIS — M6281 Muscle weakness (generalized): Secondary | ICD-10-CM | POA: Diagnosis not present

## 2023-06-28 DIAGNOSIS — M25511 Pain in right shoulder: Secondary | ICD-10-CM | POA: Diagnosis not present

## 2023-06-28 NOTE — Patient Instructions (Addendum)
 Access Code: [email or web address] URL: [email or web address] Date: 06/28/2023 Prepared by: Sudie Ely  Exercises - Putty Squeezes  - 1-2 x daily - 10 reps - Rolling Putty on Table  - 1-2 x daily - 10 reps - Finger Pinch and Pull with Putty  - 1-2 x daily - 10 reps - 3-Point Pinch with Putty  - 1-2 x daily - 10 reps - Tip PUSH with Putty  - 1-2 x daily - 10 reps - Key Pinch with Putty  - 1-2 x daily - 10 reps - Finger Extension with Putty  - 1-2 x daily - 10 reps - Finger Adduction with Putty  - 1-2 x daily - 10 reps - Removing Marbles from Putty  - 1-2 x daily - 10 reps

## 2023-06-28 NOTE — Therapy (Signed)
 OUTPATIENT OCCUPATIONAL THERAPY ORTHO TREATMENT  Patient Name: [name] MRN: [id number] DOB:[birth year], 48 y.o., male Today's Date: 06/28/2023  PCP: Dr. Wilnette Haste REFERRING PROVIDER: Cherylene Corrente, MD  END OF SESSION:  OT End of Session - 06/28/23 0931     Visit Number 8    Number of Visits 13    Date for OT Re-Evaluation 07/06/23    Authorization Type [employer] Medicare, Dual covered 100%    Authorization - Number of Visits 10    Progress Note Due on Visit 10    OT Stop Time 0930    Activity Tolerance Patient tolerated treatment well    Behavior During Therapy [facility] for tasks assessed/performed          Past Medical History:  Diagnosis Date   Acute venous embolism and thrombosis of deep vessels of proximal lower extremity (HCC) 07/19/2011   Anemia    Anginal pain (HCC)    pt denies   Anxiety    Chest pain, neg MI, normal coronaries by cath 02/18/2013   pt denies   CHF (congestive heart failure) (HCC)    Chronic renal disease, stage IV (HCC) 02/19/2013   Colon polyps    Depression    Diabetic ulcer of right foot (HCC)    DVT (deep venous thrombosis) (HCC) 09/2002   patient reports additional DVTs in '06 & '11 (unconfirmed)   ED (erectile dysfunction)    GERD (gastroesophageal reflux disease)    History of blood transfusion    related to OR (10/31/2016)   Hyperlipidemia 02/19/2013   Hypertension    Nausea & vomiting    constant for the last couple weeks (10/31/2016)   Nephrotic syndrome    Obesity    BMI 44, weight 346 pounds 01/30/14   OSA (obstructive sleep apnea) 02/28/2018   Mild obstructive sleep apnea with an AHI of 9.8/h but severe during REM sleep with an AHI of 33.8/h.  Oxygen  saturations dropped to 86% and there was moderate snoring   Peripheral edema    Pneumonia    Prosthesis adjustment 08/17/2016   Pulmonary embolism (HCC) 09/2002   treated with 6 months of warfarin   Pyelonephritis 02/02/2018   Type I diabetes mellitus (HCC) dx'd 2001    Urinary retention    Past Surgical History:  Procedure Laterality Date   AMPUTATION Right 12/22/2013   Procedure: AMPUTATION BELOW KNEE;  Surgeon: Amada Backer, MD;  Location: [facility] OR;  Service: Orthopedics;  Laterality: Right;   AMPUTATION Right 02/19/2014   Procedure: RIGHT ABOVE KNEE AMPUTATION ;  Surgeon: Amada Backer, MD;  Location: [facility] OR;  Service: Orthopedics;  Laterality: Right;   blood clot removal  2016   CARDIAC CATHETERIZATION  02/18/2013   normal coronaries   ESOPHAGOGASTRODUODENOSCOPY N/A 11/02/2016   Procedure: ESOPHAGOGASTRODUODENOSCOPY (EGD);  Surgeon: Kenney Peacemaker, MD;  Location: [facility] ENDOSCOPY;  Service: Endoscopy;  Laterality: N/A;   I & D EXTREMITY Right 12/14/2013   Procedure: IRRIGATION AND DEBRIDEMENT RIGHT FOOT;  Surgeon: Lorriane Rote, MD;  Location: [facility] OR;  Service: Orthopedics;  Laterality: Right;   INCISION AND DRAINAGE ABSCESS  2007; 2015   back   INCISION AND DRAINAGE OF WOUND Right 12/22/2013   Procedure: I&D RIGHT BUTTOCK;  Surgeon: Amada Backer, MD;  Location: [facility] OR;  Service: Orthopedics;  Laterality: Right;   LEFT HEART CATHETERIZATION WITH CORONARY ANGIOGRAM N/A 02/18/2013   Procedure: LEFT HEART CATHETERIZATION WITH CORONARY ANGIOGRAM;  Surgeon: Peter M Swaziland, MD;  Location: [facility] CATH LAB;  Service:  Cardiovascular;  Laterality: N/A;   Patient Active Problem List   Diagnosis Date Noted   Tendinosis of right shoulder, possible supraspinatus tear 05/08/2023   Gout of right wrist 03/26/2023   Insomnia 03/26/2023   Volume overload 03/21/2023   Inflammatory arthropathy 02/23/2023   Medial epicondylitis of elbow, left 05/06/2022   Diabetic neuropathy (HCC) 01/24/2022   Benign paroxysmal positional vertigo 09/14/2020   Anemia associated with chronic renal failure    Hemoglobinuria 12/12/2018   OSA (obstructive sleep apnea) 02/28/2018   Acute on chronic diastolic heart failure (HCC) 02/02/2018   Preventative health care 08/15/2017   Erectile dysfunction  04/22/2017   History of pulmonary embolus (PE) 06/09/2016   Depression 05/04/2016   Hypogonadism in male 05/04/2016   Urinary retention 05/04/2016   Dyslipidemia 08/12/2014   Unilateral AKA, right (HCC) 12/26/2013   Hyperlipidemia 02/19/2013   S/P cardiac cath, 02/18/13, normal coronaries 02/19/2013   CKD (chronic kidney disease) stage 4, GFR 15-29 ml/min (HCC) 02/19/2013   Hypertension 07/24/2011   Long term current use of anticoagulant therapy 07/19/2011   History of DVT (deep vein thrombosis) 07/28/2009   Morbid obesity (HCC) 10/26/2005   Insulin  dependent type 2 diabetes mellitus, controlled (HCC) 01/09/2002    ONSET DATE: 04/26/23  REFERRING DIAG: M79.601 (ICD-10-CM) - Right arm pain  THERAPY DIAG:  No diagnosis found.  Rationale for Evaluation and Treatment: Rehabilitation  SUBJECTIVE:   SUBJECTIVE STATEMENT: Pt arrived and stated his BS was low 63 and he had an apple but it was still low 84.  Pt offered Alexia Angelucci chese bites and BS up to 123  Pt reports he was was at the gym before coming here and that wore him out. He goes to the gym 5x/week and is going to a work out class/circuit with UB/LB, core exercises and cardio x45 minutes.  Pt accompanied by: self  PERTINENT HISTORY: patient admitted on 4/17-4/22 for right arm swelling and pain concern for gouty arthritis and septic joint given hx of gouty arthritis in past, negative for crystals on arthrocentesis, cultures negative so empiric abx stopped. MRI of R shoulder noted tendinosis and possible small supraspinatus tear with mild subacromial/subdeltoid bursitis. Orthopedics rec sling and patient has outpatient f/u with them.  PMH:  R AKA 2016, hx of anemia, anxiety, CHF, chronic kidney disease disease, depression, hx of DVT and PE, GERD, OSA, type 1 diabetes, obesity, HTN, dyslipidemia  PRECAUTIONS: Fall  WEIGHT BEARING RESTRICTIONS: No  PAIN:  Are you having pain? No  FALLS: Has patient fallen in last 6 months?  No  LIVING ENVIRONMENT: Lives with: lives alone Stairs: No Has following equipment at home: Counselling psychologist, Environmental consultant - 2 wheeled, Wheelchair (manual), and Tour manager  PLOF: Independent with basic ADLs  PATIENT GOALS: get stronger, improve pain, and ROM  NEXT MD VISIT: not yet scheduled  OBJECTIVE:  Note: Objective measures were completed at Evaluation unless otherwise noted.  HAND DOMINANCE: Right  ADLs:   Uses prosthetic some with RW to ambulate, uses w/c most of the time Overall ADLs: Pt reports performing BADLs mod I, minimal pain with transfers/using walker.  Pt does have an aide that assists with some household tasks.   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 Pt enjoys reading, writing, work out, spend time with friends, cooking, outdoors, sporting events, fishing, axe throwing, dining out.     FUNCTIONAL OUTCOME MEASURES: Quick Dash: 9%  UPPER EXTREMITY ROM:   RUE grossly WNL, but pt  notes some tightness at end ranges of shoulder movement   UPPER EXTREMITY MMT:     MMT Right eval Left eval  Shoulder flexion 5/5 5/5  Shoulder abduction 4+/5 5/5  Shoulder adduction 5/5 5/5  Shoulder extension    Shoulder internal rotation    Shoulder external rotation    Middle trapezius    Lower trapezius    Elbow flexion 5/5 5/5  Elbow extension 5/5 5/5  Wrist flexion    Wrist extension    Wrist ulnar deviation    Wrist radial deviation    Wrist pronation    Wrist supination    (Blank rows = not tested)  HAND FUNCTION: Grip strength: Right: 42 lbs; Left: 60 lbs  COORDINATION: WNL  SENSATION: Pt reports numbness/tingling in thumb and 2-3rd digits, but controlled with meds.  EDEMA: none noted   COGNITION: Overall cognitive status: Within functional limits for tasks assessed  OBSERVATIONS: Pt very motivated for improvement.   TREATMENT DATE:   06/26/23:     K-taping to relax supraspinatus, upper trap, and  correction piece across shoulder (ant to posterior) to place sh in more neutral/ER position as humeral head tends to sit more anterior still in place with pt reporting no pain today.  Pt reported he had green putty at home and pt upgraded to blue putty with increased variety of exercises provided to further progress strengthening, coordination and sensory stimulation of L UE particularly.  Program printed for pt and texted to him also. Patient provided visual demonstration, verbal and tactile cues as needed to improve performance of the various exercises/activities including:   - Putty Squeezes - cues to squeeze putty into log for use with other exercises and to fold putty in half with 1 hand  - Putty Rolls - encourage to roll putty into logs with sensory stimulation to entire length of hand, fingers and wrist as needed   - Pinch and Pull with Putty - this motion is combined with different pinches (3-Point Pinch, Tip Pinch, Key Pinch) - patient encouraged to combine tripod, pincer and/or key pinch with pinch and pull motion of putty pulling away from midline, changing between different pinches and changing different directions to change grip  - Finger Extension with Putty - pt shown how to work on task with all fingers and thumb as well as individual fingers in opposition to thumb  - Finger Adduction with Putty - pt shown how to work on weaving putty between fingers/thumb and then squeeze fingers together while laying hand flat on table top.  - Removing Objects from Putty  - encouraged to hide items (coins, marble, dice etc) and use one hand at a time to find the objects and identify them by tactile input before s/he digs them out and can see them visually.  OT re-educated patient on theraputty recommendations: avoid hot environments, place in designated container, avoid contact with fabrics. Patient verbalized understanding.    Patient needed MAX verbal/tactile cues for arousal today, and  modeling of task to allow time for processing of verbal instructions and improve motor planning of unfamiliar movements.  Pt reported new numbness in first 3 digits of Rt hand therefore also provided tendon gliding exercises/handout with review of motions to isolate DIP, PIP and MCP joints for straight finger position, hook (DIP/PIP flexion), fist (DIP/PIP/MCP flexion), taco/duck (MCP flexion only) and flat fist (MCP and PIP flexion). Education provided on purpose of tendon glide exercises ie) to increase the circulation to the hand and wrist, reduce  swelling and promote healthier soft tissue for increased AROM and not to build hand or wrist strength at this time.   PATIENT EDUCATION: Education details: Putty/Tendon glides Person educated: Patient Education method: Programmer, multimedia, Facilities manager, Verbal cues, and Handouts Education comprehension: verbalized understanding, returned demonstration, verbal cues required, tactile cues required, and needs further education  HOME EXERCISE PROGRAM: 05/22/23:  HEP for shoulder stretch 06/05/23: Wrist HEP, theraband HEP  06/28/23: Putty Access Code: [email or web address]  and Tendon glides  GOALS: Goals reviewed with patient? No  SHORT TERM GOALS: Target date: 06/22/23  Pt will be independent with initial HEP for R shoulder. Goal status: MET   2.  Pt will be independent with HEP for R wrist stretching and strength. Goal status: MET   3.  Pt will improve R grip strength to at least 6lbs to assist with ADLs/IADLs Baseline:  42lbs Goal status: IN PROGRESS  4.  Pt will verbalize understanding of proper positioning of R shoulder and wrist to decr risk of injury. Goal status: MET   LONG TERM GOALS: Target date: 07/06/23  Pt will be independent with updated HEP and appropriate community fitness activities. Baseline:  Goal status: IN POGRESS  2.  Pt will improve RUE functional use/pain as shown by improving Quick Dash to 4.5% or less Baseline: 9% Goal status:  INITIAL  3.  Pt will improve R grip strength to at least 12lbs to assist with ADLs/IADLs Baseline:  42lbs Goal status: IN PROGRESS    ASSESSMENT:  CLINICAL IMPRESSION: Patient seen today for occupational therapy treatment for UE hand strengthening and mobility to help with new numbness in hand ie) via putty and tendon gliding exercises.  Pt very tired today with Max verbal and tactile cues for arousal and engagement today. Pt progressing as expected with planned DC next week.   PERFORMANCE DEFICITS: in functional skills including ADLs, IADLs, strength, pain, and UE functional use, , and psychosocial skills including habits.   IMPAIRMENTS: are limiting patient from ADLs, IADLs, and leisure.   COMORBIDITIES: may have co-morbidities  that affects occupational performance. Patient will benefit from skilled OT to address above impairments and improve overall function.  REHAB POTENTIAL: Good  PLAN:  OT FREQUENCY: 2x/week  OT DURATION: 6 weeks +eval  PLANNED INTERVENTIONS: 97535 self care/ADL training, 36644 therapeutic exercise, 97530 therapeutic activity, 97112 neuromuscular re-education, 97140 manual therapy, 97035 ultrasound, 97018 paraffin, 03474 fluidotherapy, 97010 moist heat, 97010 cryotherapy, passive range of motion, functional mobility training, patient/family education, and DME and/or AE instructions  RECOMMENDED OTHER SERVICES: none at this time  CONSULTED AND AGREED WITH PLAN OF CARE: Patient  PLAN FOR NEXT SESSION: monitor blood sugar levels, US  prn for shoulder, grip and wrist strengthening, progress towards remaining goals  D/C by 07/05/23  Zora Hires, OTR/L 06/28/2023, 11:43 AM

## 2023-07-03 ENCOUNTER — Ambulatory Visit: Admitting: Occupational Therapy

## 2023-07-03 DIAGNOSIS — R2681 Unsteadiness on feet: Secondary | ICD-10-CM | POA: Diagnosis not present

## 2023-07-03 DIAGNOSIS — M25511 Pain in right shoulder: Secondary | ICD-10-CM

## 2023-07-03 DIAGNOSIS — R278 Other lack of coordination: Secondary | ICD-10-CM | POA: Diagnosis not present

## 2023-07-03 DIAGNOSIS — M6281 Muscle weakness (generalized): Secondary | ICD-10-CM

## 2023-07-03 DIAGNOSIS — R29898 Other symptoms and signs involving the musculoskeletal system: Secondary | ICD-10-CM | POA: Diagnosis not present

## 2023-07-03 NOTE — Patient Instructions (Addendum)
 Access Code: [email or web address] URL: [email or web address] Date: 07/03/2023 Prepared by: Clarita Pride  Exercises - Wrist Flexion and Extension with Resistance Bar  - 1 x daily - 10 reps - Forearm Supination with Flex Bar  - 1 x daily - 10 reps - Forearm Pronation with Flex Bar  - 1 x daily - 10 reps - Wrist Radial Deviation with Resistance Bar  - 1 x daily - 10 reps - Wrist Ulnar Deviation with Resistance Bar  - 1 x daily - 10 reps - Forearm Pronation with Resistance Bar  - 1 x daily - 10 reps - Forearm Supination with Resistance Bar  - 1 x daily - 10 reps - Flex Bar Oscillations with Arm in Front  - 1 x daily - 10 reps - Forearm Supination with Resistance Bar  - 1 x daily - 10 reps - Forearm Pronation with Resistance Bar  - 1 x daily - 10 reps

## 2023-07-03 NOTE — Therapy (Signed)
 OUTPATIENT OCCUPATIONAL THERAPY ORTHO TREATMENT  Patient Name: [name] MRN: [id number] DOB:[birth year], 48 y.o., male Today's Date: 07/03/2023  PCP: Dr. Delton Grow REFERRING PROVIDER: Francesco Elsie NOVAK, MD  END OF SESSION:  OT End of Session - 07/03/23 1105     Visit Number 9    Number of Visits 13    Date for OT Re-Evaluation 07/06/23    Authorization Type [employer] Medicare, Dual covered 100%    Authorization - Number of Visits 10    Progress Note Due on Visit 10    OT Start Time 1105    OT Stop Time 1145    OT Time Calculation (min) 40 min    Activity Tolerance Patient tolerated treatment well    Behavior During Therapy WFL for tasks assessed/performed          Past Medical History:  Diagnosis Date   Acute venous embolism and thrombosis of deep vessels of proximal lower extremity (HCC) 07/19/2011   Anemia    Anginal pain (HCC)    pt denies   Anxiety    Chest pain, neg MI, normal coronaries by cath 02/18/2013   pt denies   CHF (congestive heart failure) (HCC)    Chronic renal disease, stage IV (HCC) 02/19/2013   Colon polyps    Depression    Diabetic ulcer of right foot (HCC)    DVT (deep venous thrombosis) (HCC) 09/2002   patient reports additional DVTs in '06 & '11 (unconfirmed)   ED (erectile dysfunction)    GERD (gastroesophageal reflux disease)    History of blood transfusion    related to OR (10/31/2016)   Hyperlipidemia 02/19/2013   Hypertension    Nausea & vomiting    constant for the last couple weeks (10/31/2016)   Nephrotic syndrome    Obesity    BMI 44, weight 346 pounds 01/30/14   OSA (obstructive sleep apnea) 02/28/2018   Mild obstructive sleep apnea with an AHI of 9.8/h but severe during REM sleep with an AHI of 33.8/h.  Oxygen  saturations dropped to 86% and there was moderate snoring   Peripheral edema    Pneumonia    Prosthesis adjustment 08/17/2016   Pulmonary embolism (HCC) 09/2002   treated with 6 months of warfarin    Pyelonephritis 02/02/2018   Type I diabetes mellitus (HCC) dx'd 2001   Urinary retention    Past Surgical History:  Procedure Laterality Date   AMPUTATION Right 12/22/2013   Procedure: AMPUTATION BELOW KNEE;  Surgeon: Norleen Armor, MD;  Location: [facility] OR;  Service: Orthopedics;  Laterality: Right;   AMPUTATION Right 02/19/2014   Procedure: RIGHT ABOVE KNEE AMPUTATION ;  Surgeon: Norleen Armor, MD;  Location: [facility] OR;  Service: Orthopedics;  Laterality: Right;   blood clot removal  2016   CARDIAC CATHETERIZATION  02/18/2013   normal coronaries   ESOPHAGOGASTRODUODENOSCOPY N/A 11/02/2016   Procedure: ESOPHAGOGASTRODUODENOSCOPY (EGD);  Surgeon: Avram Lupita BRAVO, MD;  Location: [facility] ENDOSCOPY;  Service: Endoscopy;  Laterality: N/A;   I & D EXTREMITY Right 12/14/2013   Procedure: IRRIGATION AND DEBRIDEMENT RIGHT FOOT;  Surgeon: Elspeth JONELLE Her, MD;  Location: [facility] OR;  Service: Orthopedics;  Laterality: Right;   INCISION AND DRAINAGE ABSCESS  2007; 2015   back   INCISION AND DRAINAGE OF WOUND Right 12/22/2013   Procedure: I&D RIGHT BUTTOCK;  Surgeon: Norleen Armor, MD;  Location: [facility] OR;  Service: Orthopedics;  Laterality: Right;   LEFT HEART CATHETERIZATION WITH CORONARY ANGIOGRAM N/A 02/18/2013   Procedure: LEFT HEART CATHETERIZATION  WITH CORONARY ANGIOGRAM;  Surgeon: Peter M Swaziland, MD;  Location: [facility] C[facility] LAB;  Service: Cardiovascular;  Laterality: N/A;   Patient Active Problem List   Diagnosis Date Noted   Tendinosis of right shoulder, possible supraspinatus tear 05/08/2023   Gout of right wrist 03/26/2023   Insomnia 03/26/2023   Volume overload 03/21/2023   Inflammatory arthropathy 02/23/2023   Medial epicondylitis of elbow, left 05/06/2022   Diabetic neuropathy (HCC) 01/24/2022   Benign paroxysmal positional vertigo 09/14/2020   Anemia associated with chronic renal failure    Hemoglobinuria 12/12/2018   OSA (obstructive sleep apnea) 02/28/2018   Acute on chronic diastolic heart failure (HCC)  98/74/7979   Preventative health care 08/15/2017   Erectile dysfunction 04/22/2017   History of pulmonary embolus (PE) 06/09/2016   Depression 05/04/2016   Hypogonadism in male 05/04/2016   Urinary retention 05/04/2016   Dyslipidemia 08/12/2014   Unilateral AKA, right (HCC) 12/26/2013   Hyperlipidemia 02/19/2013   S/P cardiac cath, 02/18/13, normal coronaries 02/19/2013   CKD (chronic kidney disease) stage 4, GFR 15-29 ml/min (HCC) 02/19/2013   Hypertension 07/24/2011   Long term current use of anticoagulant therapy 07/19/2011   History of DVT (deep vein thrombosis) 07/28/2009   Morbid obesity (HCC) 10/26/2005   Insulin  dependent type 2 diabetes mellitus, controlled (HCC) 01/09/2002    ONSET DATE: 04/26/23  REFERRING DIAG: M79.601 (ICD-10-CM) - Right arm pain  THERAPY DIAG:  No diagnosis found.  Rationale for Evaluation and Treatment: Rehabilitation  SUBJECTIVE:   SUBJECTIVE STATEMENT: Pt reprots he's tired but he's ok.  BS 191 upon arrival.  Pt reported the numbness in first 3 digits of Rt hand relieved by previously provided tendon gliding exercises  Pt asked for assistance with taping today as this helps improve comfort of R shoulder and he removed the previous tape Sunday and is awaiting arrival of his own tape at home.  Pt accompanied by: self  PERTINENT HISTORY: patient admitted on 4/17-4/22 for right arm swelling and pain concern for gouty arthritis and septic joint given hx of gouty arthritis in past, negative for crystals on arthrocentesis, cultures negative so empiric abx stopped. MRI of R shoulder noted tendinosis and possible small supraspinatus tear with mild subacromial/subdeltoid bursitis. Orthopedics rec sling and patient has outpatient f/u with them.  PMH:  R AKA 2016, hx of anemia, anxiety, CHF, chronic kidney disease disease, depression, hx of DVT and PE, GERD, OSA, type 1 diabetes, obesity, HTN, dyslipidemia  PRECAUTIONS: Fall  WEIGHT BEARING  RESTRICTIONS: No  PAIN:  Are you having pain? No shoulder Up to 4/10 at worst but K taping has improved that   FALLS: Has patient fallen in last 6 months? No  LIVING ENVIRONMENT: Lives with: lives alone Stairs: No Has following equipment at home: Counselling psychologist, Environmental consultant - 2 wheeled, Wheelchair (manual), and Tour manager  PLOF: Independent with basic ADLs  PATIENT GOALS: get stronger, improve pain, and ROM  NEXT MD VISIT: not yet scheduled  OBJECTIVE:  Note: Objective measures were completed at Evaluation unless otherwise noted.  HAND DOMINANCE: Right  ADLs:   Uses prosthetic some with RW to ambulate, uses w/c most of the time Overall ADLs: Pt reports performing BADLs mod I, minimal pain with transfers/using walker.  Pt does have an aide that assists with some household tasks.   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 Pt enjoys reading, writing, work out, spend time with friends, cooking, outdoors, sporting events, fishing, axe throwing,  dining out.     FUNCTIONAL OUTCOME MEASURES: Quick Dash: 9%  UPPER EXTREMITY ROM:   RUE grossly WNL, but pt notes some tightness at end ranges of shoulder movement   UPPER EXTREMITY MMT:     MMT Right eval Left eval  Shoulder flexion 5/5 5/5  Shoulder abduction 4+/5 5/5  Shoulder adduction 5/5 5/5  Shoulder extension    Shoulder internal rotation    Shoulder external rotation    Middle trapezius    Lower trapezius    Elbow flexion 5/5 5/5  Elbow extension 5/5 5/5  Wrist flexion    Wrist extension    Wrist ulnar deviation    Wrist radial deviation    Wrist pronation    Wrist supination    (Blank rows = not tested)  HAND FUNCTION: Grip strength: Right: 42 lbs; Left: 60 lbs 07/03/23 Right 58.8, 68.1, 70.9 Average 62.6 lbs Left 76.7, 76.0, 78.0 Average 76.9 lbs  COORDINATION: WNL  SENSATION: Pt reports numbness/tingling in thumb and 2-3rd digits, but controlled with  meds.  EDEMA: none noted   COGNITION: Overall cognitive status: Within functional limits for tasks assessed  OBSERVATIONS: Pt very motivated for improvement.   TREATMENT DATE:   07/03/23:     Therapeutic Activities:  K-taping request by patient s/p removal on Sunday. Pt able to share the video taken to show application of tape to relax supraspinatus, upper trap, and correction piece across shoulder (ant to posterior) to place sh in more neutral/ER position as humeral head tends to sit more anterior still in place with pt reporting no pain today.  Therapeutic Exercises:  Retested grip strength with good improvement noted +20 lbs on R side.  Pt reports he did order a flex bar and has some exercises but reported he would like some more. Pt engaged in resistance bar exercises again with red and beige flex bar x 5 reps each motion for strength and endurance of affected extremity and printed MedBridge HEP provided in pt instructions for the following: -wrist flex and extension - twisting bar forward and backwards to flex and extend wrist/s -supination/pronation - bending the bar on table top side to side to rotate forearm - palm up and down with one hand at a time or bending the bar down or up with 2 hands on the bar -radial/ulnar deviation - pushing the bar back on forth on table top to tip wrist - from pinkie back to thumb See pt instructions for more details. Pt able to perform all motions without difficulty.    PATIENT EDUCATION: Education details: Surveyor, quantity Person educated: Patient Education method: Programmer, multimedia, Facilities manager, Verbal cues, and Handouts Education comprehension: verbalized understanding, returned demonstration, verbal cues required, tactile cues required, and needs further education  HOME EXERCISE PROGRAM: 05/22/23:  HEP for shoulder stretch 06/05/23: Wrist HEP, theraband HEP  06/28/23: Putty Access Code: [email or web address]  and Tendon glides 07/03/23: Flex Bar  Exercises: same Access Code  GOALS: Goals reviewed with patient? No  SHORT TERM GOALS: Target date: 06/22/23  Pt will be independent with initial HEP for R shoulder. Goal status: MET   2.  Pt will be independent with HEP for R wrist stretching and strength. Goal status: MET   3.  Pt will improve R grip strength to at least 6lbs to assist with ADLs/IADLs Baseline:  42lbs Goal status: MET 07/03/23: Average: R 62.6 lbs L 76.9 lbs  4.  Pt will verbalize understanding of proper positioning of R shoulder and wrist to decr  risk of injury. Goal status: MET   LONG TERM GOALS: Target date: 07/06/23  Pt will be independent with updated HEP and appropriate community fitness activities. Baseline:  Goal status: MET - pt going to the gym daily  2.  Pt will improve RUE functional use/pain as shown by improving Quick Dash to 4.5% or less Baseline: 9% Goal status: IN Progress  3.  Pt will improve R grip strength to at least 12lbs to assist with ADLs/IADLs Baseline:  42lbs Goal status: MET 07/03/23: Average: R 62.6 lbs L 76.9 lbs   ASSESSMENT:  CLINICAL IMPRESSION: Patient seen today for occupational therapy treatment for UE hand strengthening and pain management with pt requesting K tape today and update flex bar HEP provided. Pt progressing as expected with planned DC at next visit.   PERFORMANCE DEFICITS: in functional skills including ADLs, IADLs, strength, pain, and UE functional use, , and psychosocial skills including habits.   IMPAIRMENTS: are limiting patient from ADLs, IADLs, and leisure.   COMORBIDITIES: may have co-morbidities  that affects occupational performance. Patient will benefit from skilled OT to address above impairments and improve overall function.  REHAB POTENTIAL: Good  PLAN:  OT FREQUENCY: 2x/week  OT DURATION: 6 weeks +eval  PLANNED INTERVENTIONS: 97535 self care/ADL training, 02889 therapeutic exercise, 97530 therapeutic activity, 97112 neuromuscular  re-education, 97140 manual therapy, 97035 ultrasound, 97018 paraffin, 02960 fluidotherapy, 97010 moist heat, 97010 cryotherapy, passive range of motion, functional mobility training, patient/family education, and DME and/or AE instructions  RECOMMENDED OTHER SERVICES: none at this time  CONSULTED AND AGREED WITH PLAN OF CARE: Patient  PLAN FOR NEXT SESSION: monitor blood sugar levels,  progress towards remaining goals - Junie Palin D/C visit  Clarita LITTIE Pride, OTR/L 07/03/2023, 11:05 AM

## 2023-07-05 ENCOUNTER — Ambulatory Visit: Admitting: Occupational Therapy

## 2023-07-05 DIAGNOSIS — M6281 Muscle weakness (generalized): Secondary | ICD-10-CM | POA: Diagnosis not present

## 2023-07-05 DIAGNOSIS — R278 Other lack of coordination: Secondary | ICD-10-CM | POA: Diagnosis not present

## 2023-07-05 DIAGNOSIS — M25511 Pain in right shoulder: Secondary | ICD-10-CM | POA: Diagnosis not present

## 2023-07-05 DIAGNOSIS — R29898 Other symptoms and signs involving the musculoskeletal system: Secondary | ICD-10-CM | POA: Diagnosis not present

## 2023-07-05 DIAGNOSIS — R2681 Unsteadiness on feet: Secondary | ICD-10-CM | POA: Diagnosis not present

## 2023-07-05 NOTE — Patient Instructions (Signed)
 Access Code: [email or web address] URL: [email or web address] Date: 07/05/2023 Prepared by: Clarita Pride  Reviewed Exercises - Putty Squeezes  - 1-2 x daily - 10 reps - Rolling Putty on Table  - 1-2 x daily - 10 reps - Finger Pinch and Pull with Putty  - 1-2 x daily - 10 reps - 3-Point Pinch with Putty  - 1-2 x daily - 10 reps - Tip PUSH with Putty  - 1-2 x daily - 10 reps - Key Pinch with Putty  - 1-2 x daily - 10 reps - Finger Extension with Putty  - 1-2 x daily - 10 reps - Finger Adduction with Putty  - 1-2 x daily - 10 reps - Removing Marbles from Putty  - 1-2 x daily - 10 reps - Wrist Flexion and Extension with Resistance Bar  - 1 x daily - 10 reps - Forearm Supination with Flex Bar  - 1 x daily - 10 reps - Forearm Pronation with Flex Bar  - 1 x daily - 10 reps - Wrist Radial Deviation with Resistance Bar  - 1 x daily - 10 reps - Wrist Ulnar Deviation with Resistance Bar  - 1 x daily - 10 reps - Forearm Pronation with Resistance Bar  - 1 x daily - 10 reps - Forearm Supination with Resistance Bar  - 1 x daily - 10 reps - Flex Bar Oscillations with Arm in Front  - 1 x daily - 10 reps - Forearm Supination with Resistance Bar  - 1 x daily - 10 reps - Forearm Pronation with Resistance Bar  - 1 x daily - 10 reps  NEW EXERCISES: - Forearm Pronation and Supination with Hammer  - 1 x daily - 10 reps - Standing Wrist Radial Deviation with Hammer  - 1 x daily - 10 reps - Standing Wrist Ulnar Deviation with Hammer  - 1 x daily - 10 reps - Seated Shoulder Horizontal Abduction with Resistance  - 1 x daily - 10 reps - Seated Shoulder Diagonal Pulls with Resistance  - 1 x daily - 10 reps - Seated Shoulder Flexion with Self-Anchored Resistance  - 1 x daily - 10 reps - Seated Elbow Flexion with Self-Anchored Resistance  - 1 x daily - 10 reps - Seated Elbow Extension with Self-Anchored Resistance  - 1 x daily - 10 reps - Wrist Extension with Resistance  - 1 x daily - 10 reps - Wrist Flexion  with Resistance  - 1 x daily - 10 reps

## 2023-07-05 NOTE — Therapy (Signed)
 OUTPATIENT OCCUPATIONAL THERAPY ORTHO TREATMENT & DISCHARGE SUMMARY  Patient Name: [name] MRN: [id number] DOB:[birth year], 48 y.o., male Today's Date: 07/05/2023  PCP: Dr. Delton Grow REFERRING PROVIDER: Francesco Elsie NOVAK, MD  END OF SESSION:  OT End of Session - 07/05/23 1021     Visit Number 10    Number of Visits 13    Date for OT Re-Evaluation 07/06/23    Authorization Type [employer] Medicare, Dual covered 100%    Authorization - Number of Visits 10    Progress Note Due on Visit 10    OT Start Time 1020    OT Stop Time 1100    OT Time Calculation (min) 40 min    Activity Tolerance Patient tolerated treatment well    Behavior During Therapy WFL for tasks assessed/performed          Past Medical History:  Diagnosis Date   Acute venous embolism and thrombosis of deep vessels of proximal lower extremity (HCC) 07/19/2011   Anemia    Anginal pain (HCC)    pt denies   Anxiety    Chest pain, neg MI, normal coronaries by cath 02/18/2013   pt denies   CHF (congestive heart failure) (HCC)    Chronic renal disease, stage IV (HCC) 02/19/2013   Colon polyps    Depression    Diabetic ulcer of right foot (HCC)    DVT (deep venous thrombosis) (HCC) 09/2002   patient reports additional DVTs in '06 & '11 (unconfirmed)   ED (erectile dysfunction)    GERD (gastroesophageal reflux disease)    History of blood transfusion    related to OR (10/31/2016)   Hyperlipidemia 02/19/2013   Hypertension    Nausea & vomiting    constant for the last couple weeks (10/31/2016)   Nephrotic syndrome    Obesity    BMI 44, weight 346 pounds 01/30/14   OSA (obstructive sleep apnea) 02/28/2018   Mild obstructive sleep apnea with an AHI of 9.8/h but severe during REM sleep with an AHI of 33.8/h.  Oxygen  saturations dropped to 86% and there was moderate snoring   Peripheral edema    Pneumonia    Prosthesis adjustment 08/17/2016   Pulmonary embolism (HCC) 09/2002   treated with 6 months of  warfarin   Pyelonephritis 02/02/2018   Type I diabetes mellitus (HCC) dx'd 2001   Urinary retention    Past Surgical History:  Procedure Laterality Date   AMPUTATION Right 12/22/2013   Procedure: AMPUTATION BELOW KNEE;  Surgeon: Norleen Armor, MD;  Location: [facility] OR;  Service: Orthopedics;  Laterality: Right;   AMPUTATION Right 02/19/2014   Procedure: RIGHT ABOVE KNEE AMPUTATION ;  Surgeon: Norleen Armor, MD;  Location: [facility] OR;  Service: Orthopedics;  Laterality: Right;   blood clot removal  2016   CARDIAC CATHETERIZATION  02/18/2013   normal coronaries   ESOPHAGOGASTRODUODENOSCOPY N/A 11/02/2016   Procedure: ESOPHAGOGASTRODUODENOSCOPY (EGD);  Surgeon: Avram Lupita BRAVO, MD;  Location: [facility] ENDOSCOPY;  Service: Endoscopy;  Laterality: N/A;   I & D EXTREMITY Right 12/14/2013   Procedure: IRRIGATION AND DEBRIDEMENT RIGHT FOOT;  Surgeon: Elspeth JONELLE Her, MD;  Location: [facility] OR;  Service: Orthopedics;  Laterality: Right;   INCISION AND DRAINAGE ABSCESS  2007; 2015   back   INCISION AND DRAINAGE OF WOUND Right 12/22/2013   Procedure: I&D RIGHT BUTTOCK;  Surgeon: Norleen Armor, MD;  Location: [facility] OR;  Service: Orthopedics;  Laterality: Right;   LEFT HEART CATHETERIZATION WITH CORONARY ANGIOGRAM N/A 02/18/2013   Procedure:  LEFT HEART CATHETERIZATION WITH CORONARY ANGIOGRAM;  Surgeon: Peter M Swaziland, MD;  Location: [facility] CATH LAB;  Service: Cardiovascular;  Laterality: N/A;   Patient Active Problem List   Diagnosis Date Noted   Tendinosis of right shoulder, possible supraspinatus tear 05/08/2023   Gout of right wrist 03/26/2023   Insomnia 03/26/2023   Volume overload 03/21/2023   Inflammatory arthropathy 02/23/2023   Medial epicondylitis of elbow, left 05/06/2022   Diabetic neuropathy (HCC) 01/24/2022   Benign paroxysmal positional vertigo 09/14/2020   Anemia associated with chronic renal failure    Hemoglobinuria 12/12/2018   OSA (obstructive sleep apnea) 02/28/2018   Acute on chronic diastolic heart  failure (HCC) 98/74/7979   Preventative health care 08/15/2017   Erectile dysfunction 04/22/2017   History of pulmonary embolus (PE) 06/09/2016   Depression 05/04/2016   Hypogonadism in male 05/04/2016   Urinary retention 05/04/2016   Dyslipidemia 08/12/2014   Unilateral AKA, right (HCC) 12/26/2013   Hyperlipidemia 02/19/2013   S/P cardiac cath, 02/18/13, normal coronaries 02/19/2013   CKD (chronic kidney disease) stage 4, GFR 15-29 ml/min (HCC) 02/19/2013   Hypertension 07/24/2011   Long term current use of anticoagulant therapy 07/19/2011   History of DVT (deep vein thrombosis) 07/28/2009   Morbid obesity (HCC) 10/26/2005   Insulin  dependent type 2 diabetes mellitus, controlled (HCC) 01/09/2002    ONSET DATE: 04/26/23  REFERRING DIAG: M79.601 (ICD-10-CM) - Right arm pain  THERAPY DIAG:  No diagnosis found.  Rationale for Evaluation and Treatment: Rehabilitation  SUBJECTIVE:   SUBJECTIVE STATEMENT:  Pt initially asked for an extension of OT services due to his wrist not being where it needs to be, ie) needs to be a bit stronger.  He reports his flex bars arrive Saturday, he has black, blue and green theraband and would appreciate updated exercises.  He continues with daily gym circuit activities as well.  Pt reprots he's tired but he's ok although he was frequently nodding off during OT session.  BS 136 upon arrival.  Pt accompanied by: self  PERTINENT HISTORY: patient admitted on 4/17-4/22 for right arm swelling and pain concern for gouty arthritis and septic joint given hx of gouty arthritis in past, negative for crystals on arthrocentesis, cultures negative so empiric abx stopped. MRI of R shoulder noted tendinosis and possible small supraspinatus tear with mild subacromial/subdeltoid bursitis. Orthopedics rec sling and patient has outpatient f/u with them.  PMH:  R AKA 2016, hx of anemia, anxiety, CHF, chronic kidney disease disease, depression, hx of DVT and PE, GERD,  OSA, type 1 diabetes, obesity, HTN, dyslipidemia  PRECAUTIONS: Fall  WEIGHT BEARING RESTRICTIONS: No  PAIN:  Are you having pain? No shoulder Up to 4/10 at worst but K taping has improved that   FALLS: Has patient fallen in last 6 months? No  LIVING ENVIRONMENT: Lives with: lives alone Stairs: No Has following equipment at home: Counselling psychologist, Environmental consultant - 2 wheeled, Wheelchair (manual), and Tour manager  PLOF: Independent with basic ADLs  PATIENT GOALS: get stronger, improve pain, and ROM  NEXT MD VISIT: not yet scheduled  OBJECTIVE:  Note: Objective measures were completed at Evaluation unless otherwise noted.  HAND DOMINANCE: Right  ADLs:   Uses prosthetic some with RW to ambulate, uses w/c most of the time Overall ADLs: Pt reports performing BADLs mod I, minimal pain with transfers/using walker.  Pt does have an aide that assists with some household tasks.   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 Pt enjoys reading, writing, work out, spend time with friends, cooking, outdoors, sporting events, fishing, axe throwing, dining out.     FUNCTIONAL OUTCOME MEASURES: Quick Dash: 9%  UPPER EXTREMITY ROM:   RUE grossly WNL, but pt notes some tightness at end ranges of shoulder movement   UPPER EXTREMITY MMT:     MMT Right eval Left eval  Shoulder flexion 5/5 5/5  Shoulder abduction 4+/5 5/5  Shoulder adduction 5/5 5/5  Shoulder extension    Shoulder internal rotation    Shoulder external rotation    Middle trapezius    Lower trapezius    Elbow flexion 5/5 5/5  Elbow extension 5/5 5/5  Wrist flexion    Wrist extension    Wrist ulnar deviation    Wrist radial deviation    Wrist pronation    Wrist supination    (Blank rows = not tested)  HAND FUNCTION: Grip strength: Right: 42 lbs; Left: 60 lbs 07/03/23 Right 58.8, 68.1, 70.9 Average 62.6 lbs Left 76.7, 76.0, 78.0 Average 76.9  lbs  COORDINATION: WNL  SENSATION: Pt reports numbness/tingling in thumb and 2-3rd digits, but controlled with meds.  EDEMA: none noted   COGNITION: Overall cognitive status: Within functional limits for tasks assessed  OBSERVATIONS: Pt very motivated for improvement.   TREATMENT DATE:   07/05/23:     Self Care:  Objective measures assessed as noted in Goals section to determine progression towards goals. Discharge plans discussed with pt as he has met all his goals, has adequate resources to maintain strength and comfort ie) gym membership, various home based exercise equipment and HEP program, video to help with R shoulder taping etc with pt in agreement with DC by end of session.  Therapeutic Exercises:  Retested grip strength with good improvement noted +20 lbs on R side.  Reviewed Exercises: - Putty Squeezes  - 1-2 x daily - 10 reps - Rolling Putty on Table  - 1-2 x daily - 10 reps - Finger Pinch and Pull with Putty  - 1-2 x daily - 10 reps - 3-Point Pinch with Putty  - 1-2 x daily - 10 reps - Tip PUSH with Putty  - 1-2 x daily - 10 reps - Key Pinch with Putty  - 1-2 x daily - 10 reps - Finger Extension with Putty  - 1-2 x daily - 10 reps - Finger Adduction with Putty  - 1-2 x daily - 10 reps - Removing Marbles from Putty  - 1-2 x daily - 10 reps - Wrist Flexion and Extension with Resistance Bar  - 1 x daily - 10 reps - Forearm Supination with Flex Bar  - 1 x daily - 10 reps - Forearm Pronation with Flex Bar  - 1 x daily - 10 reps - Wrist Radial Deviation with Resistance Bar  - 1 x daily - 10 reps - Wrist Ulnar Deviation with Resistance Bar  - 1 x daily - 10 reps - Forearm Pronation with Resistance Bar  - 1 x daily - 10 reps - Forearm Supination with Resistance Bar  - 1 x daily - 10 reps - Flex Bar Oscillations with Arm in Front  - 1 x daily - 10 reps - Forearm Supination with Resistance Bar  - 1 x daily - 10 reps - Forearm Pronation with Resistance Bar  - 1 x daily  - 10 reps  New Goals added and reviewed with hammer and resistance band:  - Forearm Pronation and Supination with Hammer  -  1 x daily - 10 reps - Standing Wrist Radial Deviation with Hammer  - 1 x daily - 10 reps - Standing Wrist Ulnar Deviation with Hammer  - 1 x daily - 10 reps - Seated Shoulder Horizontal Abduction with Resistance  - 1 x daily - 10 reps - Seated Shoulder Diagonal Pulls with Resistance  - 1 x daily - 10 reps - Seated Shoulder Flexion with Self-Anchored Resistance  - 1 x daily - 10 reps - Seated Elbow Flexion with Self-Anchored Resistance  - 1 x daily - 10 reps - Seated Elbow Extension with Self-Anchored Resistance  - 1 x daily - 10 reps - Wrist Extension with Resistance  - 1 x daily - 10 reps - Wrist Flexion with Resistance  - 1 x daily - 10 reps   PATIENT EDUCATION: Education details: Theraband exercises Person educated: Patient Education method: Explanation, Demonstration, Verbal cues, and Handouts Education comprehension: verbalized understanding, returned demonstration, verbal cues required, tactile cues required, and needs further education  HOME EXERCISE PROGRAM: 05/22/23:  HEP for shoulder stretch 06/05/23: Wrist HEP, theraband HEP  06/28/23: Putty Access Code: [email or web address]  and Tendon glides 07/03/23: Flex Bar Exercises: same Access Code 07/05/23: Leonel and resistance band Exercises: same Access code  GOALS: Goals reviewed with patient? No  SHORT TERM GOALS: Target date: 06/22/23  Pt will be independent with initial HEP for R shoulder. Goal status: MET   2.  Pt will be independent with HEP for R wrist stretching and strength. Goal status: MET   3.  Pt will improve R grip strength to at least 6lbs to assist with ADLs/IADLs Baseline:  42lbs Goal status: MET 07/03/23: Average: R 62.6 lbs L 76.9 lbs  4.  Pt will verbalize understanding of proper positioning of R shoulder and wrist to decr risk of injury. Goal status: MET   LONG TERM GOALS: Target date:  07/06/23  Pt will be independent with updated HEP and appropriate community fitness activities. Baseline:  Goal status: MET - pt going to the gym daily  2.  Pt will improve RUE functional use/pain as shown by improving Quick Dash to 4.5% or less Baseline: 9% Goal status: MET - 0%  3.  Pt will improve R grip strength to at least 12lbs to assist with ADLs/IADLs Baseline:  42lbs Goal status: MET 07/03/23: Average: R 62.6 lbs L 76.9 lbs  ASSESSMENT:  CLINICAL IMPRESSION: Patient is appropriate for discharge and no longer demonstrates medical necessity for continued skilled occupational therapy services.  PERFORMANCE DEFICITS: in functional skills including ADLs, IADLs, strength, pain, and UE functional use, , and psychosocial skills including habits.   IMPAIRMENTS: are limiting patient from ADLs, IADLs, and leisure.   COMORBIDITIES: may have co-morbidities  that affects occupational performance. Patient will benefit from skilled OT to address above impairments and improve overall function.  REHAB POTENTIAL: Good  PLAN:  OCCUPATIONAL THERAPY DISCHARGE SUMMARY  Visits from Start of Care: 10  Current functional level related to goals / functional outcomes: Pt has met 4/4 short term and 3/3 long term goals to satisfactory levels and is pleased with outcomes.   Remaining deficits: Pt has no more significant functional deficits or pain.   Education / Equipment: Pt has all needed materials and education. Pt understands how to continue on with self-management. See tx notes for more details.   Patient agrees to discharge due to max benefits received from outpatient occupational therapy at this time.    Clarita LITTIE Pride, OTR/L 07/05/2023, 10:23 AM

## 2023-07-06 ENCOUNTER — Encounter (HOSPITAL_COMMUNITY)
Admission: RE | Admit: 2023-07-06 | Discharge: 2023-07-06 | Disposition: A | Discharge status: Home / Self Care | Source: Ambulatory Visit | Attending: Nephrology | Admitting: Nephrology

## 2023-07-06 VITALS — BP 134/70 | HR 83 | Temp 97.4°F | Resp 16

## 2023-07-06 DIAGNOSIS — N184 Chronic kidney disease, stage 4 (severe): Secondary | ICD-10-CM

## 2023-07-06 LAB — RENAL FUNCTION PANEL
Albumin: 3.5 g/dL (ref 3.5–5.0)
Anion gap: 8 (ref 5–15)
BUN: 60 mg/dL — ABNORMAL HIGH (ref 6–20)
CO2: 21 mmol/L — ABNORMAL LOW (ref 22–32)
Calcium: 9 mg/dL (ref 8.9–10.3)
Chloride: 107 mmol/L (ref 98–111)
Creatinine, Ser: 3.01 mg/dL — ABNORMAL HIGH (ref 0.61–1.24)
GFR, Estimated: 25 mL/min — ABNORMAL LOW (ref >60)
Glucose, Bld: 68 mg/dL — ABNORMAL LOW (ref 70–99)
Phosphorus: 4.4 mg/dL (ref 2.5–4.6)
Potassium: 4.7 mmol/L (ref 3.5–5.1)
Sodium: 136 mmol/L (ref 135–145)

## 2023-07-06 LAB — IRON AND TIBC
Iron: 32 ug/dL — ABNORMAL LOW (ref 45–182)
Saturation Ratios: 11 % — ABNORMAL LOW (ref 17.9–39.5)
TIBC: 304 ug/dL (ref 250–450)
UIBC: 272 ug/dL

## 2023-07-06 LAB — POCT HEMOGLOBIN-HEMACUE: Hemoglobin: 11.6 g/dL — ABNORMAL LOW (ref 13.0–17.0)

## 2023-07-06 LAB — FERRITIN: Ferritin: 161 ng/mL (ref 24–336)

## 2023-07-06 MED ORDER — DARBEPOETIN ALFA 200 MCG/0.4ML IJ SOSY
200.0000 ug | PREFILLED_SYRINGE | INTRAMUSCULAR | Status: DC
  Administered 2023-07-06: 200 ug via SUBCUTANEOUS

## 2023-07-06 MED ORDER — DARBEPOETIN ALFA 200 MCG/0.4ML IJ SOSY
PREFILLED_SYRINGE | INTRAMUSCULAR | Status: AC
  Filled 2023-07-06: qty 0.4

## 2023-07-10 ENCOUNTER — Encounter: Admitting: Occupational Therapy

## 2023-07-11 ENCOUNTER — Other Ambulatory Visit: Payer: Self-pay | Admitting: Student

## 2023-07-11 ENCOUNTER — Ambulatory Visit: Payer: Self-pay | Admitting: Student

## 2023-07-11 MED ORDER — APIXABAN 5 MG PO TABS: 5.0000 mg | ORAL_TABLET | Freq: Two times a day (BID) | ORAL | 0 refills | Status: DC

## 2023-07-11 NOTE — Telephone Encounter (Signed)
 Copied from CRM [id number]. Topic: Clinical - Medication Refill >> Jul 11, 2023  2:44 PM Brittney F wrote:  Patient is in need of refill of his ELIQUIS  5 MG TABS tablet prescription. Patient has been without the medication for two days; No symptoms to report at this time.    Callback Number: [id number]    Medication: ELIQUIS  5 MG TABS tablet      Has the patient contacted their pharmacy?  yes  This is the patient's preferred pharmacy:  [id number] - [place] - [address] [place] ([place]) [place] [address] Phone: [phone] Fax: [phone]    Is this the correct pharmacy for this prescription? Yes   Has the prescription been filled recently? No  Is the patient out of the medication? Yes  Has the patient been seen for an appointment in the last year OR does the patient have an upcoming appointment? Yes  Can we respond through MyChart? Yes  Agent: Please be advised that Rx refills may take up to 3 business days. We ask that you follow-up with your pharmacy.

## 2023-07-11 NOTE — Telephone Encounter (Signed)
 Message from Kilmichael F sent at 07/11/2023  2:44 PM EDT  Patient is in need of refill of his ELIQUIS  5 MG TABS tablet prescription. Patient has been without the medication for two days; No symptoms to report at this time.   Callback Number: [id number]   Reason for Disposition  [1] Prescription refill request for ESSENTIAL medicine (i.e., likelihood of harm to patient if not taken) AND [2] triager unable to refill per department policy  Answer Assessment - Initial Assessment Questions 1. DRUG NAME: What medicine do you need to have refilled?     Eliquis  2. REFILLS REMAINING: How many refills are remaining? (Note: The label on the medicine or pill bottle will show how many refills are remaining. If there are no refills remaining, then a renewal may be needed.)     0 Patient out of meds X two days. Request refill today.  Protocols used: Medication Refill and Renewal Call-A-AH

## 2023-07-12 ENCOUNTER — Encounter: Admitting: Occupational Therapy

## 2023-07-17 ENCOUNTER — Other Ambulatory Visit: Payer: Self-pay

## 2023-07-17 ENCOUNTER — Ambulatory Visit: Payer: Self-pay | Admitting: *Deleted

## 2023-07-17 MED ORDER — OXYBUTYNIN CHLORIDE 5 MG PO TABS: 5.0000 mg | ORAL_TABLET | Freq: Three times a day (TID) | ORAL | 1 refills | Status: DC

## 2023-07-17 NOTE — Telephone Encounter (Signed)
 FYI Only or Action Required?: Action required by provider: request for appointment.  Patient was last seen in primary care on 06/11/2023 by Elicia Sharper, DO.  Called Nurse Triage reporting Pain.  Symptoms began several days ago.  Interventions attempted: OTC medications: tylenol  and old Rx ?SABRA  Symptoms are: rapidly worsening.  Triage Disposition: See HCP Within 4 Hours (Or PCP Triage)  Patient/caregiver understands and will follow disposition?: Unsure                       Copied from CRM [id number]. Topic: Clinical - Red Word Triage >> Jul 17, 2023  8:32 AM Diannia H wrote: [facility] that prompted transfer to Nurse Triage: Patient called today and wanted to know could the provider send him in something for the pain for his left index finger, pain level 1-10 is a 10. Appointment was made. He didn't express the pain until I asked after the appointment. Reason for Disposition  [1] SEVERE pain (e.g., excruciating) AND [2] not improved after 2 hours of pain medicine  Answer Assessment - Initial Assessment Questions 1. ONSET: When did the pain start?      Sunday  2. LOCATION and RADIATION: Where is the pain located?  (e.g., fingertip, around nail, joint, entire  finger)      Entire finger left index finger 3. SEVERITY: How bad is the pain? What does it keep you from doing?   (Scale 1-10; or mild, moderate, severe)  - MILD (1-3): doesn't interfere with normal activities.   - MODERATE (4-7): interferes with normal activities or awakens from sleep.  - SEVERE (8-10): excruciating pain, unable to hold a glass of water or bend finger even a little.     *No Answer* 4. APPEARANCE: What does the finger look like? (e.g., redness, swelling, bruising, pallor)     *No Answer* 5. WORK OR EXERCISE: Has there been any recent work or exercise that involved this part (i.e., fingers or hand) of the body?     *No Answer* 6. CAUSE: What do you think is causing the pain?      *No Answer* 7. AGGRAVATING FACTORS: What makes the pain worse? (e.g., using computer)     *No Answer* 8. OTHER SYMPTOMS: Do you have any other symptoms? (e.g., fever, neck pain, numbness)     Tingling , pain burning swollen warm to touch  9. PREGNANCY: Is there any chance you are pregnant? When was your last menstrual period?     na  Protocols used: Finger Pain-A-AH  Reason for Disposition  [1] SEVERE pain (e.g., excruciating) AND [2] not improved after 2 hours of pain medicine  Answer Assessment - Initial Assessment Questions 1. ONSET: When did the pain start?      Sunday  2. LOCATION and RADIATION: Where is the pain located?  (e.g., fingertip, around nail, joint, entire  finger)      Entire finger left index finger 3. SEVERITY: How bad is the pain? What does it keep you from doing?   (Scale 1-10; or mild, moderate, severe)  - MILD (1-3): doesn't interfere with normal activities.   - MODERATE (4-7): interferes with normal activities or awakens from sleep.  - SEVERE (8-10): excruciating pain, unable to hold a glass of water or bend finger even a little.     severe 4. APPEARANCE: What does the finger look like? (e.g., redness, swelling, bruising, pallor)     Red,, swelling entire finger up to palm 5. WORK OR EXERCISE:  Has there been any recent work or exercise that involved this part (i.e., fingers or hand) of the body?     Na  6. CAUSE: What do you think is causing the pain?     Not sure  7. AGGRAVATING FACTORS: What makes the pain worse? 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 OTHER SYMPTOMS: Do you have any other symptoms? (e.g., fever, neck pain, numbness)     Tingling , pain burning swollen warm to touch hx DM can make fist but finger will not bend to make fist 9. PREGNANCY: Is there any chance you are pregnant? When was your last menstrual period?     Na   No available appt today . Appt  already scheduled for tomorrow with provider. Patient is diabetic and can not use finger to type on computer to do school work. Transportation an issue. Patient requesting pain medication until being seen tomorrow. Recommended mobile bus and gave location. Please advise.  Protocols used: Finger Pain-A-AH

## 2023-07-17 NOTE — Telephone Encounter (Signed)
 Call to patient unable to come in for an appointment on today.  Requesting s a pain med to help him until he can come in for the appointment on tomorrow.

## 2023-07-18 ENCOUNTER — Telehealth: Payer: Self-pay

## 2023-07-18 ENCOUNTER — Other Ambulatory Visit: Payer: Self-pay

## 2023-07-18 ENCOUNTER — Ambulatory Visit: Admitting: Student

## 2023-07-18 VITALS — BP 130/67 | HR 84 | Temp 98.1°F | Ht 74.0 in

## 2023-07-18 DIAGNOSIS — M7989 Other specified soft tissue disorders: Secondary | ICD-10-CM | POA: Diagnosis not present

## 2023-07-18 DIAGNOSIS — R2232 Localized swelling, mass and lump, left upper limb: Secondary | ICD-10-CM

## 2023-07-18 MED ORDER — OXYCODONE HCL 5 MG PO CAPS: 5.0000 mg | ORAL_CAPSULE | Freq: Four times a day (QID) | ORAL | 0 refills | Status: DC | PRN

## 2023-07-18 NOTE — Patient Instructions (Addendum)
 Thank you, Mr.Lakeem Raimondo for allowing us  to provide your care today. Today we discussed:  Ice, elevate and allow it to drain.   For pain, please take Tylenol  500 mg, 2 tablets every 6 hours.    Please get your hand x ray done.   Address:  [address] [place], [place], [place] [address]  I have ordered the following labs for you:  Lab Orders  No laboratory test(s) ordered today     Tests ordered today:  None   Referrals ordered today:   Referral Orders  No referral(s) requested today     I have ordered the following medication/changed the following medications:   Stop the following medications: There are no discontinued medications.   Start the following medications: Meds ordered this encounter  Medications   oxycodone  (OXY-IR) 5 MG capsule    Sig: Take 1 capsule (5 mg total) by mouth every 6 (six) hours as needed for up to 3 days.    Dispense:  12 capsule    Refill:  0     Follow up: If symptoms worsen     Remember:   Should you have any questions or concerns please call the internal medicine clinic at [phone].     Rayann Atway, D.O. [facility]

## 2023-07-18 NOTE — Telephone Encounter (Signed)
 Junious Hubka (Key: [id number]) PA Case ID #: [id number] Rx #: [id number] Need Help? Call us  at [phone] Outcome Approved today by [employer] 2017 NCPDP Request Reference Number: [id number]. OXYCODONE  CAP 5MG  is approved through 01/09/2024. Your patient may now fill this prescription and it will be covered. Effective Date: 07/18/2023 Authorization Expiration Date: 01/09/2024 Drug oxyCODONE  HCl 5MG  capsules ePA cloud logo Form [employer] Medicare Part D Electronic Prior Authorization Form (2017 NCPDP) Original Claim Info [id number] Presc does not have a DEA, Sbmt SCC 43Provide Exception Process Printed NoticeNon-Form, Dr. Vada ePA at [email or web address] is not authorized for drug`sDEA ClassTotal MME 30.00MG OXYCODONE TABS;HYD*WMART*[id number]*Please visit [email or web address] for assistance resolvingthe most common patient rejections

## 2023-07-18 NOTE — Telephone Encounter (Signed)
 Patient called regarding his rx for Oxycodone , patient stated that the pharmacy told him the DEA number for the provider that sent the rx was invalid. I called the pharmacy and spoke to Crnde she stated that she ran the rx and it is going though she will go ahead and get the rx ready for him. Patient is aware.

## 2023-07-18 NOTE — Progress Notes (Unsigned)
   Acute Office Visit  Subjective:     Patient ID: [name], male    DOB: 25-Sep-1975, 48 y.o.   MRN: [id number]  No chief complaint on file.   HPI Patient is in today for swelling of index finger.   PMH: Hypertension, HFpEF, OSA, type 2 diabetes followed by endocrinology with peripheral neuropathy, CKD 4, right AKA, history of PE DVT on Eliquis    ROS    As per assessment and plan  Objective:    There were no vitals taken for this visit. BP Readings from Last 3 Encounters:  07/06/23 134/70  06/22/23 139/67  06/11/23 (!) 132/59   Wt Readings from Last 3 Encounters:  06/11/23 (!) 319 lb (144.7 kg)  05/15/23 (!) 317 lb (143.8 kg)  04/26/23 (!) 330 lb (149.7 kg)      Physical Exam  No results found for any visits on 07/18/23.      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  - Pain started on Sunday, swelling on Monday.  - Aching, throbbing, burning pain, constant. Decrease ROM of the index fingers  - Tried icing and heat, Took tramadol  one tablet.   = Xray, refused. Wanted to get US  of the Left hand  Problem List Items Addressed This Visit   None   No orders of the defined types were placed in this encounter.   No follow-ups on file.  Toma Edwards, DO

## 2023-07-18 NOTE — Telephone Encounter (Signed)
 Prior Authorization for patient (oxyCODONE  HCl 5MG  capsules) came through on cover my meds was submitted with last office notes awaiting approval or denial.  [id number]

## 2023-07-19 ENCOUNTER — Ambulatory Visit
Admission: RE | Admit: 2023-07-19 | Discharge: 2023-07-19 | Disposition: A | Discharge status: Home / Self Care | Source: Ambulatory Visit | Attending: Internal Medicine | Admitting: Internal Medicine

## 2023-07-19 ENCOUNTER — Emergency Department (HOSPITAL_COMMUNITY)

## 2023-07-19 ENCOUNTER — Observation Stay (HOSPITAL_COMMUNITY)
Admission: EM | Admit: 2023-07-19 | Discharge: 2023-07-21 | Disposition: A | Discharge status: Home / Self Care | Attending: Emergency Medicine | Admitting: Emergency Medicine

## 2023-07-19 ENCOUNTER — Other Ambulatory Visit: Payer: Self-pay

## 2023-07-19 ENCOUNTER — Ambulatory Visit

## 2023-07-19 ENCOUNTER — Ambulatory Visit: Payer: Self-pay

## 2023-07-19 VITALS — BP 143/74 | HR 91 | Temp 100.0°F

## 2023-07-19 DIAGNOSIS — L089 Local infection of the skin and subcutaneous tissue, unspecified: Secondary | ICD-10-CM | POA: Diagnosis not present

## 2023-07-19 DIAGNOSIS — R0689 Other abnormalities of breathing: Secondary | ICD-10-CM | POA: Diagnosis not present

## 2023-07-19 DIAGNOSIS — R6510 Systemic inflammatory response syndrome (SIRS) of non-infectious origin without acute organ dysfunction: Secondary | ICD-10-CM | POA: Insufficient documentation

## 2023-07-19 DIAGNOSIS — M7989 Other specified soft tissue disorders: Secondary | ICD-10-CM

## 2023-07-19 DIAGNOSIS — Z1152 Encounter for screening for COVID-19: Secondary | ICD-10-CM | POA: Diagnosis not present

## 2023-07-19 DIAGNOSIS — M79602 Pain in left arm: Secondary | ICD-10-CM

## 2023-07-19 DIAGNOSIS — R8281 Pyuria: Secondary | ICD-10-CM | POA: Insufficient documentation

## 2023-07-19 DIAGNOSIS — I10 Essential (primary) hypertension: Secondary | ICD-10-CM | POA: Diagnosis not present

## 2023-07-19 DIAGNOSIS — Z794 Long term (current) use of insulin: Secondary | ICD-10-CM | POA: Diagnosis not present

## 2023-07-19 DIAGNOSIS — Z8739 Personal history of other diseases of the musculoskeletal system and connective tissue: Secondary | ICD-10-CM | POA: Diagnosis not present

## 2023-07-19 DIAGNOSIS — N184 Chronic kidney disease, stage 4 (severe): Secondary | ICD-10-CM | POA: Insufficient documentation

## 2023-07-19 DIAGNOSIS — F10920 Alcohol use, unspecified with intoxication, uncomplicated: Secondary | ICD-10-CM | POA: Diagnosis not present

## 2023-07-19 DIAGNOSIS — E1140 Type 2 diabetes mellitus with diabetic neuropathy, unspecified: Secondary | ICD-10-CM | POA: Insufficient documentation

## 2023-07-19 DIAGNOSIS — R609 Edema, unspecified: Secondary | ICD-10-CM | POA: Diagnosis not present

## 2023-07-19 DIAGNOSIS — E1122 Type 2 diabetes mellitus with diabetic chronic kidney disease: Secondary | ICD-10-CM | POA: Insufficient documentation

## 2023-07-19 DIAGNOSIS — E119 Type 2 diabetes mellitus without complications: Secondary | ICD-10-CM

## 2023-07-19 DIAGNOSIS — I130 Hypertensive heart and chronic kidney disease with heart failure and stage 1 through stage 4 chronic kidney disease, or unspecified chronic kidney disease: Secondary | ICD-10-CM | POA: Diagnosis not present

## 2023-07-19 DIAGNOSIS — I5030 Unspecified diastolic (congestive) heart failure: Secondary | ICD-10-CM | POA: Diagnosis not present

## 2023-07-19 DIAGNOSIS — R509 Fever, unspecified: Secondary | ICD-10-CM | POA: Diagnosis not present

## 2023-07-19 DIAGNOSIS — N3000 Acute cystitis without hematuria: Secondary | ICD-10-CM | POA: Diagnosis not present

## 2023-07-19 DIAGNOSIS — E785 Hyperlipidemia, unspecified: Secondary | ICD-10-CM | POA: Diagnosis not present

## 2023-07-19 DIAGNOSIS — L0390 Cellulitis, unspecified: Secondary | ICD-10-CM | POA: Diagnosis not present

## 2023-07-19 DIAGNOSIS — M1A9XX0 Chronic gout, unspecified, without tophus (tophi): Secondary | ICD-10-CM | POA: Diagnosis present

## 2023-07-19 DIAGNOSIS — M79603 Pain in arm, unspecified: Secondary | ICD-10-CM | POA: Diagnosis not present

## 2023-07-19 DIAGNOSIS — M109 Gout, unspecified: Principal | ICD-10-CM | POA: Diagnosis present

## 2023-07-19 LAB — COMPREHENSIVE METABOLIC PANEL WITH GFR
ALT: 11 U/L (ref 0–44)
AST: 19 U/L (ref 15–41)
Albumin: 3.6 g/dL (ref 3.5–5.0)
Alkaline Phosphatase: 61 U/L (ref 38–126)
Anion gap: 12 (ref 5–15)
BUN: 63 mg/dL — ABNORMAL HIGH (ref 6–20)
CO2: 20 mmol/L — ABNORMAL LOW (ref 22–32)
Calcium: 9 mg/dL (ref 8.9–10.3)
Chloride: 104 mmol/L (ref 98–111)
Creatinine, Ser: 3.41 mg/dL — ABNORMAL HIGH (ref 0.61–1.24)
GFR, Estimated: 22 mL/min — ABNORMAL LOW (ref >60)
Glucose, Bld: 137 mg/dL — ABNORMAL HIGH (ref 70–99)
Potassium: 5.3 mmol/L — ABNORMAL HIGH (ref 3.5–5.1)
Sodium: 136 mmol/L (ref 135–145)
Total Bilirubin: 1.1 mg/dL (ref 0.0–1.2)
Total Protein: 7.3 g/dL (ref 6.5–8.1)

## 2023-07-19 LAB — PROTIME-INR
INR: 1.3 — ABNORMAL HIGH (ref 0.8–1.2)
Prothrombin Time: 17.3 s — ABNORMAL HIGH (ref 11.4–15.2)

## 2023-07-19 LAB — CBC WITH DIFFERENTIAL/PLATELET
Abs Immature Granulocytes: 0.04 K/uL (ref 0.00–0.07)
Basophils Absolute: 0 K/uL (ref 0.0–0.1)
Basophils Relative: 0 %
Eosinophils Absolute: 0.1 K/uL (ref 0.0–0.5)
Eosinophils Relative: 1 %
HCT: 40.4 % (ref 39.0–52.0)
Hemoglobin: 12.7 g/dL — ABNORMAL LOW (ref 13.0–17.0)
Immature Granulocytes: 0 %
Lymphocytes Relative: 7 %
Lymphs Abs: 0.9 K/uL (ref 0.7–4.0)
MCH: 28.4 pg (ref 26.0–34.0)
MCHC: 31.4 g/dL (ref 30.0–36.0)
MCV: 90.4 fL (ref 80.0–100.0)
Monocytes Absolute: 1.3 K/uL — ABNORMAL HIGH (ref 0.1–1.0)
Monocytes Relative: 9 %
Neutro Abs: 11.2 K/uL — ABNORMAL HIGH (ref 1.7–7.7)
Neutrophils Relative %: 83 %
Platelets: 252 K/uL (ref 150–400)
RBC: 4.47 MIL/uL (ref 4.22–5.81)
RDW: 15.7 % — ABNORMAL HIGH (ref 11.5–15.5)
WBC: 13.5 K/uL — ABNORMAL HIGH (ref 4.0–10.5)
nRBC: 0 % (ref 0.0–0.2)

## 2023-07-19 LAB — URINALYSIS, W/ REFLEX TO CULTURE (INFECTION SUSPECTED)
Bilirubin Urine: NEGATIVE
Glucose, UA: NEGATIVE mg/dL
Hgb urine dipstick: NEGATIVE
Ketones, ur: NEGATIVE mg/dL
Nitrite: NEGATIVE
Protein, ur: 30 mg/dL — AB
Specific Gravity, Urine: 1.008 (ref 1.005–1.030)
WBC, UA: >50 WBC/hpf (ref 0–5)
pH: 6 (ref 5.0–8.0)

## 2023-07-19 LAB — RESP PANEL BY RT-PCR (RSV, FLU A&B, COVID)  RVPGX2
Influenza A by PCR: NEGATIVE
Influenza B by PCR: NEGATIVE
Resp Syncytial Virus by PCR: NEGATIVE
SARS Coronavirus 2 by RT PCR: NEGATIVE

## 2023-07-19 LAB — CBG MONITORING, ED
Glucose-Capillary: 110 mg/dL — ABNORMAL HIGH (ref 70–99)
Glucose-Capillary: 113 mg/dL — ABNORMAL HIGH (ref 70–99)

## 2023-07-19 LAB — URIC ACID: Uric Acid, Serum: 9.2 mg/dL — ABNORMAL HIGH (ref 3.7–8.6)

## 2023-07-19 LAB — I-STAT CG4 LACTIC ACID, ED: Lactic Acid, Venous: 0.9 mmol/L (ref 0.5–1.9)

## 2023-07-19 MED ORDER — HYDROMORPHONE HCL 1 MG/ML IJ SOLN
1.0000 mg | Freq: Once | INTRAMUSCULAR | Status: AC
  Administered 2023-07-19: 1 mg via INTRAVENOUS
  Filled 2023-07-19: qty 1

## 2023-07-19 MED ORDER — ACETAMINOPHEN 500 MG PO TABS
1000.0000 mg | ORAL_TABLET | Freq: Once | ORAL | Status: AC
  Administered 2023-07-19: 1000 mg via ORAL
  Filled 2023-07-19: qty 2

## 2023-07-19 MED ORDER — HYDROMORPHONE HCL 1 MG/ML IJ SOLN
0.5000 mg | Freq: Once | INTRAMUSCULAR | Status: AC
  Administered 2023-07-19: 0.5 mg via INTRAVENOUS
  Filled 2023-07-19: qty 1

## 2023-07-19 MED ORDER — ALLOPURINOL 100 MG PO TABS
50.0000 mg | ORAL_TABLET | Freq: Every day | ORAL | Status: DC
  Administered 2023-07-20: 50 mg via ORAL
  Filled 2023-07-19: qty 1

## 2023-07-19 MED ORDER — INSULIN ASPART 100 UNIT/ML IJ SOLN
0.0000 [IU] | Freq: Three times a day (TID) | INTRAMUSCULAR | Status: DC
  Administered 2023-07-20: 3 [IU] via SUBCUTANEOUS

## 2023-07-19 MED ORDER — FUROSEMIDE 40 MG PO TABS
100.0000 mg | ORAL_TABLET | Freq: Every day | ORAL | Status: DC
  Administered 2023-07-20 – 2023-07-21 (×2): 100 mg via ORAL
  Filled 2023-07-19 (×2): qty 1

## 2023-07-19 MED ORDER — PIPERACILLIN-TAZOBACTAM 3.375 G IVPB 30 MIN
3.3750 g | Freq: Once | INTRAVENOUS | Status: AC
  Administered 2023-07-19: 3.375 g via INTRAVENOUS
  Filled 2023-07-19: qty 50

## 2023-07-19 MED ORDER — TAMSULOSIN HCL 0.4 MG PO CAPS
0.4000 mg | ORAL_CAPSULE | Freq: Every day | ORAL | Status: DC
  Administered 2023-07-19 – 2023-07-20 (×2): 0.4 mg via ORAL
  Filled 2023-07-19 (×2): qty 1

## 2023-07-19 MED ORDER — GABAPENTIN 100 MG PO CAPS
100.0000 mg | ORAL_CAPSULE | Freq: Every day | ORAL | Status: DC
  Administered 2023-07-20: 100 mg via ORAL
  Filled 2023-07-19: qty 1

## 2023-07-19 MED ORDER — APIXABAN 5 MG PO TABS
5.0000 mg | ORAL_TABLET | Freq: Two times a day (BID) | ORAL | Status: DC
  Administered 2023-07-19 – 2023-07-21 (×4): 5 mg via ORAL
  Filled 2023-07-19 (×4): qty 1

## 2023-07-19 MED ORDER — HYDRALAZINE HCL 50 MG PO TABS
100.0000 mg | ORAL_TABLET | Freq: Three times a day (TID) | ORAL | Status: DC
  Administered 2023-07-20 – 2023-07-21 (×4): 100 mg via ORAL
  Filled 2023-07-19 (×4): qty 2

## 2023-07-19 MED ORDER — DICLOFENAC SODIUM 1 % EX GEL
2.0000 g | Freq: Four times a day (QID) | CUTANEOUS | Status: DC | PRN
  Filled 2023-07-19: qty 100

## 2023-07-19 MED ORDER — LACTATED RINGERS IV SOLN: INTRAVENOUS | Status: DC

## 2023-07-19 MED ORDER — PIPERACILLIN-TAZOBACTAM 3.375 G IVPB
3.3750 g | Freq: Three times a day (TID) | INTRAVENOUS | Status: DC
  Administered 2023-07-20: 3.375 g via INTRAVENOUS
  Filled 2023-07-19: qty 50

## 2023-07-19 MED ORDER — ONDANSETRON HCL 4 MG/2ML IJ SOLN
4.0000 mg | Freq: Once | INTRAMUSCULAR | Status: AC
  Administered 2023-07-19: 4 mg via INTRAVENOUS
  Filled 2023-07-19: qty 2

## 2023-07-19 MED ORDER — VANCOMYCIN HCL 2000 MG/400ML IV SOLN
2000.0000 mg | Freq: Once | INTRAVENOUS | Status: AC
  Administered 2023-07-19: 2000 mg via INTRAVENOUS
  Filled 2023-07-19: qty 400

## 2023-07-19 MED ORDER — COLCHICINE 0.6 MG PO TABS
1.2000 mg | ORAL_TABLET | Freq: Once | ORAL | Status: AC
  Administered 2023-07-19: 1.2 mg via ORAL
  Filled 2023-07-19: qty 2

## 2023-07-19 MED ORDER — PRAVASTATIN SODIUM 40 MG PO TABS
20.0000 mg | ORAL_TABLET | Freq: Every day | ORAL | Status: DC
  Administered 2023-07-20: 20 mg via ORAL
  Filled 2023-07-19: qty 1

## 2023-07-19 MED ORDER — OXYBUTYNIN CHLORIDE 5 MG PO TABS
5.0000 mg | ORAL_TABLET | Freq: Three times a day (TID) | ORAL | Status: DC
  Administered 2023-07-19 – 2023-07-21 (×5): 5 mg via ORAL
  Filled 2023-07-19 (×5): qty 1

## 2023-07-19 MED ORDER — AMLODIPINE BESYLATE 10 MG PO TABS
10.0000 mg | ORAL_TABLET | Freq: Every day | ORAL | Status: DC
  Administered 2023-07-19 – 2023-07-20 (×2): 10 mg via ORAL
  Filled 2023-07-19: qty 1
  Filled 2023-07-19: qty 2

## 2023-07-19 MED ORDER — ACETAMINOPHEN 325 MG PO TABS: 650.0000 mg | ORAL_TABLET | Freq: Four times a day (QID) | ORAL | Status: DC | PRN

## 2023-07-19 MED ORDER — FLUTICASONE PROPIONATE 50 MCG/ACT NA SUSP
2.0000 | Freq: Every day | NASAL | Status: DC | PRN
  Filled 2023-07-19: qty 16

## 2023-07-19 MED ORDER — LACTATED RINGERS IV BOLUS (SEPSIS)
1000.0000 mL | Freq: Once | INTRAVENOUS | Status: AC
  Administered 2023-07-19: 1000 mL via INTRAVENOUS

## 2023-07-19 MED ORDER — INSULIN GLARGINE-YFGN 100 UNIT/ML ~~LOC~~ SOLN
36.0000 [IU] | Freq: Every day | SUBCUTANEOUS | Status: DC
Start: 2023-07-20 — End: 2023-07-21
  Administered 2023-07-20 – 2023-07-21 (×2): 36 [IU] via SUBCUTANEOUS
  Filled 2023-07-19 (×3): qty 0.36

## 2023-07-19 MED ORDER — GABAPENTIN 100 MG PO CAPS
200.0000 mg | ORAL_CAPSULE | Freq: Two times a day (BID) | ORAL | Status: DC
  Administered 2023-07-19 – 2023-07-21 (×4): 200 mg via ORAL
  Filled 2023-07-19 (×4): qty 2

## 2023-07-19 MED ORDER — LIDOCAINE 5 % EX PTCH
1.0000 | MEDICATED_PATCH | CUTANEOUS | Status: DC
  Administered 2023-07-20 – 2023-07-21 (×2): 1 via TRANSDERMAL
  Filled 2023-07-19 (×2): qty 1

## 2023-07-19 MED ORDER — CLONIDINE HCL 0.1 MG/24HR TD PTWK
0.1000 mg | MEDICATED_PATCH | TRANSDERMAL | Status: DC
  Administered 2023-07-19: 0.1 mg via TRANSDERMAL
  Filled 2023-07-19: qty 1

## 2023-07-19 NOTE — Consult Note (Signed)
 [name] is an 48 y.o. male.   Chief Complaint: Left hand and arm pain HPI: 48 year old male states he started having pain in his left index finger 4 days ago.  This felt like a gouty attack.  He has been seen by his primary care physician and was taking allopurinol .  His pain and swelling have worsened over the past few days.  He now has pain in the digits hand wrist forearm and elbow.  His pain mostly is located at his joints.  He states this feels like previous gouty attacks.  On Saturday he was boxing with gloves and a bag.  He had a shot of tequila at a friend's party later that day.  He states he has not had any red meats.  He has not had any wounds or abrasions in his hand.  He does not know of any specific injury.  He is type I diabetic and has renal disease.  Allergies:  Allergies  Allergen Reactions   Reglan  [Metoclopramide ] Other (See Comments)    Dysphoric reaction   Chlorthalidone Hives   Metformin  And Related Diarrhea   Nsaids Other (See Comments)    Chronic kidney disease IV   Ozempic  (0.25 Or 0.5 Mg-Dose) [Semaglutide (0.25 Or 0.5mg -Dos)] Nausea And Vomiting   Thiazide-Type Diuretics Other (See Comments)    Gout    Past Medical History:  Diagnosis Date   Acute venous embolism and thrombosis of deep vessels of proximal lower extremity (HCC) 07/19/2011   Anemia    Anginal pain (HCC)    pt denies   Anxiety    Chest pain, neg MI, normal coronaries by cath 02/18/2013   pt denies   CHF (congestive heart failure) (HCC)    Chronic renal disease, stage IV (HCC) 02/19/2013   Colon polyps    Depression    Diabetic ulcer of right foot (HCC)    DVT (deep venous thrombosis) (HCC) 09/2002   patient reports additional DVTs in '06 & '11 (unconfirmed)   ED (erectile dysfunction)    GERD (gastroesophageal reflux disease)    History of blood transfusion    related to OR (10/31/2016)   Hyperlipidemia 02/19/2013   Hypertension    Nausea & vomiting    constant for the last  couple weeks (10/31/2016)   Nephrotic syndrome    Obesity    BMI 44, weight 346 pounds 01/30/14   OSA (obstructive sleep apnea) 02/28/2018   Mild obstructive sleep apnea with an AHI of 9.8/h but severe during REM sleep with an AHI of 33.8/h.  Oxygen  saturations dropped to 86% and there was moderate snoring   Peripheral edema    Pneumonia    Prosthesis adjustment 08/17/2016   Pulmonary embolism (HCC) 09/2002   treated with 6 months of warfarin   Pyelonephritis 02/02/2018   Type I diabetes mellitus (HCC) dx'd 2001   Urinary retention     Past Surgical History:  Procedure Laterality Date   AMPUTATION Right 12/22/2013   Procedure: AMPUTATION BELOW KNEE;  Surgeon: Norleen Armor, MD;  Location: [facility] OR;  Service: Orthopedics;  Laterality: Right;   AMPUTATION Right 02/19/2014   Procedure: RIGHT ABOVE KNEE AMPUTATION ;  Surgeon: Norleen Armor, MD;  Location: [facility] OR;  Service: Orthopedics;  Laterality: Right;   blood clot removal  2016   CARDIAC CATHETERIZATION  02/18/2013   normal coronaries   ESOPHAGOGASTRODUODENOSCOPY N/A 11/02/2016   Procedure: ESOPHAGOGASTRODUODENOSCOPY (EGD);  Surgeon: Avram Lupita BRAVO, MD;  Location: [facility] ENDOSCOPY;  Service: Endoscopy;  Laterality: N/A;  I & D EXTREMITY Right 12/14/2013   Procedure: IRRIGATION AND DEBRIDEMENT RIGHT FOOT;  Surgeon: Elspeth JONELLE Her, MD;  Location: [facility] OR;  Service: Orthopedics;  Laterality: Right;   INCISION AND DRAINAGE ABSCESS  2007; 2015   back   INCISION AND DRAINAGE OF WOUND Right 12/22/2013   Procedure: I&D RIGHT BUTTOCK;  Surgeon: Norleen Armor, MD;  Location: [facility] OR;  Service: Orthopedics;  Laterality: Right;   LEFT HEART CATHETERIZATION WITH CORONARY ANGIOGRAM N/A 02/18/2013   Procedure: LEFT HEART CATHETERIZATION WITH CORONARY ANGIOGRAM;  Surgeon: Peter M Swaziland, MD;  Location: [facility] CATH LAB;  Service: Cardiovascular;  Laterality: N/A;    Family History: Family History  Problem Relation Age of Onset   Heart disease Mother    Diabetes  Mother    Diabetes Maternal Uncle    Diabetes Cousin     Social History:   reports that he has never smoked. He has never used smokeless tobacco. He reports current alcohol use. He reports that he does not use drugs.  Medications: (Not in a hospital admission)   Results for orders placed or performed during the hospital encounter of 07/19/23 (from the past 48 hours)  Comprehensive metabolic panel     Status: Abnormal   Collection Time: 07/19/23  5:51 PM  Result Value Ref Range   Sodium 136 135 - 145 mmol/L   Potassium 5.3 (H) 3.5 - 5.1 mmol/L   Chloride 104 98 - 111 mmol/L   CO2 20 (L) 22 - 32 mmol/L   Glucose, Bld 137 (H) 70 - 99 mg/dL    Comment: Glucose reference range applies only to samples taken after fasting for at least 8 hours.   BUN 63 (H) 6 - 20 mg/dL   Creatinine, Ser 6.58 (H) 0.61 - 1.24 mg/dL   Calcium  9.0 8.9 - 10.3 mg/dL   Total Protein 7.3 6.5 - 8.1 g/dL   Albumin 3.6 3.5 - 5.0 g/dL   AST 19 15 - 41 U/L   ALT 11 0 - 44 U/L   Alkaline Phosphatase 61 38 - 126 U/L   Total Bilirubin 1.1 0.0 - 1.2 mg/dL   GFR, Estimated 22 (L) >60 mL/min    Comment: (NOTE) Calculated using the CKD-EPI Creatinine Equation (2021)    Anion gap 12 5 - 15    Comment: Performed at [facility], [address], [place], [place] [address]  CBC with Differential     Status: Abnormal   Collection Time: 07/19/23  5:51 PM  Result Value Ref Range   WBC 13.5 (H) 4.0 - 10.5 K/uL   RBC 4.47 4.22 - 5.81 MIL/uL   Hemoglobin 12.7 (L) 13.0 - 17.0 g/dL   HCT 59.5 60.9 - 47.9 %   MCV 90.4 80.0 - 100.0 fL   MCH 28.4 26.0 - 34.0 pg   MCHC 31.4 30.0 - 36.0 g/dL   RDW 84.2 (H) 88.4 - 84.4 %   Platelets 252 150 - 400 K/uL   nRBC 0.0 0.0 - 0.2 %   Neutrophils Relative % 83 %   Neutro Abs 11.2 (H) 1.7 - 7.7 K/uL   Lymphocytes Relative 7 %   Lymphs Abs 0.9 0.7 - 4.0 K/uL   Monocytes Relative 9 %   Monocytes Absolute 1.3 (H) 0.1 - 1.0 K/uL   Eosinophils Relative 1 %   Eosinophils Absolute  0.1 0.0 - 0.5 K/uL   Basophils Relative 0 %   Basophils Absolute 0.0 0.0 - 0.1 K/uL   Immature  Granulocytes 0 %   Abs Immature Granulocytes 0.04 0.00 - 0.07 K/uL    Comment: Performed at [facility], [address], [place], [place] [address]  Resp panel by RT-PCR (RSV, Flu A&B, Covid) Anterior Nasal Swab     Status: None   Collection Time: 07/19/23  6:06 PM   Specimen: Anterior Nasal Swab  Result Value Ref Range   SARS Coronavirus 2 by RT PCR NEGATIVE NEGATIVE   Influenza A by PCR NEGATIVE NEGATIVE   Influenza B by PCR NEGATIVE NEGATIVE    Comment: (NOTE) The Xpert Xpress SARS-CoV-2/FLU/RSV plus assay is intended as an aid in the diagnosis of influenza from Nasopharyngeal swab specimens and should not be used as a sole basis for treatment. Nasal washings and aspirates are unacceptable for Xpert Xpress SARS-CoV-2/FLU/RSV testing.  Fact Sheet for Patients: BloggerCourse.com  Fact Sheet for Healthcare Providers: SeriousBroker.it  This test is not yet approved or cleared by the [place]  FDA and has been authorized for detection and/or diagnosis of SARS-CoV-2 by FDA under an Emergency Use Authorization (EUA). This EUA will remain in effect (meaning this test can be used) for the duration of the COVID-19 declaration under Section 564(b)(1) of the Act, 21 U.S.C. section 360bbb-3(b)(1), unless the authorization is terminated or revoked.     Resp Syncytial Virus by PCR NEGATIVE NEGATIVE    Comment: (NOTE) Fact Sheet for Patients: BloggerCourse.com  Fact Sheet for Healthcare Providers: SeriousBroker.it  This test is not yet approved or cleared by the [place]  FDA and has been authorized for detection and/or diagnosis of SARS-CoV-2 by FDA under an Emergency Use Authorization (EUA). This EUA will remain in effect (meaning this test can be used) for the duration of  the COVID-19 declaration under Section 564(b)(1) of the Act, 21 U.S.C. section 360bbb-3(b)(1), unless the authorization is terminated or revoked.  Performed at [facility], [address] [place], [place] [address]   I-Stat Lactic Acid, ED     Status: None   Collection Time: 07/19/23  6:10 PM  Result Value Ref Range   Lactic Acid, Venous 0.9 0.5 - 1.9 mmol/L   *Note: Due to a large number of results and/or encounters for the requested time period, some results have not been displayed. A complete set of results can be found in Results Review.    CT FOREARM LEFT WO CONTRAST Result Date: 07/19/2023 CLINICAL DATA:  Deep space infection in the hand, forearm, and elbow EXAM: CT OF THE LEFT FOREARM WITHOUT CONTRAST TECHNIQUE: Multidetector CT imaging was performed according to the standard protocol. Multiplanar CT image reconstructions were also generated. RADIATION DOSE REDUCTION: This exam was performed according to the departmental dose-optimization program which includes automated exposure control, adjustment of the mA and/or kV according to patient size and/or use of iterative reconstruction technique. COMPARISON:  Left hand radiograph 07/19/2023. Left elbow and forearm radiographs 08/31/2022. FINDINGS: Bones/Joint/Cartilage Motion artifact. Bones appear intact. No evidence of acute fracture or dislocation. No focal bone lesion or bone destruction. No cortical changes to suggest osteomyelitis. Ligaments Suboptimally assessed by CT. Muscles and Tendons Streak artifact and motion artifact limits evaluation. No intramuscular mass or hematoma identified. Soft tissues No loculated collections. IMPRESSION: 1. Technically limited study due to motion artifact and streak artifact. 2. No acute bony abnormalities are seen. 3. No loculated collection to suggest abscess. Consider ultrasound or MRI for better evaluation of the forearm soft tissues if clinically indicated. Electronically Signed   By: Elsie Mannie HERO.D.  On: 07/19/2023 19:30   DG Chest Port 1 View Result Date: 07/19/2023 CLINICAL DATA:  Infection. Arm swelling starting on Sunday and progressively worsening. Fever today. History of gout and cellulitis. EXAM: PORTABLE CHEST 1 VIEW COMPARISON:  03/13/2023 FINDINGS: Shallow inspiration. Heart size and pulmonary vascularity are normal for technique and unchanged. Lungs are clear. No pleural effusion or pneumothorax. Mediastinal contours appear intact. IMPRESSION: Shallow inspiration.  No evidence of active pulmonary disease. Electronically Signed   By: Elsie Gravely M.D.   On: 07/19/2023 19:07      Blood pressure 135/69, pulse 100, temperature (!) 101.4 F (38.6 C), temperature source Axillary, resp. rate 17, height 6' 2 (1.88 m), weight (!) 145.6 kg, SpO2 91%.  General appearance: alert, cooperative, and appears stated age Head: Normocephalic, without obvious abnormality, atraumatic Neck: supple, symmetrical, trachea midline Extremities: Left arm: Intact sensation capillary refill in the fingertips.  He is swollen in the digits more so on the index finger.  He has swelling on the dorsum of the hand.  This is boggy in nature.  There is no erythema.  He is tender at the PIP and MP joints of the index finger.  Less tender in the PIP and MP joints of the long ring and small fingers.  Mild tenderness on the dorsum of the hand.  He is tender at the wrist.  Mild tenderness in the forearm.  He is tender at the elbow.  Has pain with motion of the index finger as well as the wrist and elbow.  I do not see any erythema.  No proximal streaking.  No wounds. Skin: Skin color, texture, turgor normal. No rashes or lesions Neurologic: Grossly normal Incision/Wound: none  Assessment/Plan Left arm gouty attack versus infection.  I am more suspicious of gouty attack at this time.  He does not look ill.  He does not feel well though he states.  White blood count 13.5.  Oral temperatures have been  normal.  An axillary temperature was elevated.  He is tender and painful mostly at his joints.  There does not seem to be a contiguous tracking of pain or abscess consistent with progressive infection.  This feels like previous gouty attacks to him and he has a history of traumatic insult to the arm with the boxing on Saturday as well as a shot of tequila.  Recommend uric acid level.  Will also try colchicine  1.2 mg x 1 followed by 0.6 mg 1 hour later and see if this improves his symptoms.  Continue prophylactic antibiotics.  CT of the left arm pending.  We may see how he does overnight with reevaluation in the morning.  He is comfortable with the plan.  Carsen Machi 07/19/2023, 7:33 PM

## 2023-07-19 NOTE — Sepsis Progress Note (Signed)
 Code Sepsis protocol being monitored by eLink.

## 2023-07-19 NOTE — Telephone Encounter (Signed)
 I called pt - no answer. Allopurinol  is on pt's med list. Left message on pt's vm if he needs Allopurinol  refill or  another appt and to call the office to let us  know.

## 2023-07-19 NOTE — Assessment & Plan Note (Signed)
 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  Reports on Sunday morning he woke up with pain along his left index finger, noted swelling on Monday.  Reports since then it has progressed to his third digit with swellings of the 2nd and 3rd MCP joint.  Reports aching, throbbing, burning pain that is constant in nature.  Reports decreased range of motion of the index and third digits.  Reports that he tried icing and heat at home, he took 1 tablet of tramadol  that was leftover from his prior prescription that helped.  On exam, edema along the index and third digit is present including the 2nd and 3rd MCP joint.  Range of motion is limited due to pain and swelling.  Otherwise left upper extremity exam is unremarkable.   Point-of-care ultrasound of the left index, 3rd and 4th digit noted for subcutaneous swelling.  No acute bony abnormalities. -X-ray of the left hand -Oxycodone  5 mg every 6 hours for 3 days for acute pain -Rest, ice, elevate, compression

## 2023-07-19 NOTE — Telephone Encounter (Signed)
 Return call from pt who stated he might need a stronger dose of Allopurinol  or abx. Stated he has been stressed and has not been eating red meats. I did not realized pt has an appt today @ 1530Pm w/ Dr Bernadine - pt stated he will try his best to be here.

## 2023-07-19 NOTE — Hospital Course (Addendum)
 Acute Gout Flare Pt presented to the ED on 7/10 from the [facility] clinic for concern of acute gout flare vs. Infection of the LUE. There was significant edema with decreased mobility of the left wrist and fingers. He was started on Vanc and Zosyn  for concern of infection and SIRS criteria. CT showed no acute bony abnormalities or abscess. He was evaluated by orthopedics who were not concerned for an acute infection. This was deemed an acute gout flare and the patient was given colchicine  in the ED. Due to his renal function, he was started on a 5 days course of 40mg  prednisone  with a taper. On 7/12 the pt had significantly reduced swelling and increased mobility. He was deemed safe for discharge.

## 2023-07-19 NOTE — Progress Notes (Signed)
 ED Pharmacy Antibiotic Sign Off An antibiotic consult was received from an ED provider for vancomycin  and zosyn  per pharmacy dosing for sepsis. A chart review was completed to assess appropriateness.  he following one time order(s) were placed per pharmacy consult:  vancomycin  2000 mg x 1 dose zosyn  3.375g q8h  Further antibiotic and/or antibiotic pharmacy consults should be ordered by the admitting provider if indicated.   Thank you for allowing pharmacy to be a part of this patient's care.   Dorn Buttner, PharmD, BCPS 07/19/2023 6:23 PM ED Clinical Pharmacist -  [phone]

## 2023-07-19 NOTE — Telephone Encounter (Signed)
 FYI Only or Action Required?: Action required by provider: request for appointment.  Patient was last seen in primary care on 07/18/2023 by Heddy Barren, DO.  Called Nurse Triage reporting Hand Pain.  Symptoms began several days ago.  Interventions attempted: Prescription medications: oxycodone .  Symptoms are: gradually worsening.  Triage Disposition: See HCP Within 4 Hours (Or PCP Triage)  Patient/caregiver understands and will follow disposition?: YesCopied from CRM [id number]. Topic: Clinical - Red Word Triage >> Jul 19, 2023  8:54 AM Zane F wrote: Red Word that prompted transfer to Nurse Triage:   Gout flare up; swelling has increased from left index finger to his whole left hand; pain has reach up to his left elbow with limited range motion in left elbow Reason for Disposition  [1] SEVERE pain (e.g., excruciating, unable to use hand at all) AND [2] not improved after 2 hours of pain medicine  Answer Assessment - Initial Assessment Questions 1. ONSET: When did the pain start?     Monday 2. LOCATION: Where is the pain located?     Left hand/arm  3. PAIN: How bad is the pain? (Scale 1-10; or mild, moderate, severe)     Aching-10  5. CAUSE: What do you think is causing the pain?     gout 6. AGGRAVATING FACTORS: What makes the pain worse? (e.g., using computer)     Touching hurts 7. OTHER SYMPTOMS: Do you have any other symptoms? (e.g., fever, neck pain, numbness or tingling, rash, swelling)     Arm is weak   Pain/swelling since Monday. Pain is from fingers to elbow. Pt was seen yesterday but symptoms have worsened. Pt can't lift arm and swelling is now over whole hand. Pt feels this is a gout flare-up.  Protocols used: Hand Pain-A-AH

## 2023-07-19 NOTE — Assessment & Plan Note (Signed)
 Chief Complaint: Severe right hand and forearm pain with swelling, nausea, and vomiting.  History of Present Illness: The patient returned to the clinic today for worsening right hand and forearm pain. He was seen yesterday (07/18/23) for similar symptoms, POCUS suggested joint inflammation without abscess, medications were prescribed and XRAy was ordered.(Xray show no bone abnormality)  However, the patient reports no improvement and notes that the pain has significantly worsened overnight.  Today, he describes severe throbbing pain extending from his right hand up to his elbow, associated with inability to move the hand, nausea, and vomiting. He states that the prescribed medications were not helpful.  Review of Systems:  Positive: Pain, swelling, redness, nausea, vomiting, chills Negative: No visible ulcer or bite site noted  Objective:  Temperature: 100F Patient was visibly shivering and vomited in the clinic Right hand and forearm were swollen, erythematous, and warm to the touch No ulcerations or external wounds observed Limited range of motion due to pain  Assessment: Gout flare (suspected) Cellulitis Cannot rule out septic arthritis  Plan: Due to the acute worsening of symptoms, physical findings, and systemic signs (fever, vomiting), the patient was referred to the Emergency Department for immediate evaluation. Recommended further workup including joint aspiration, blood work, and imaging to rule out septic arthritis or deep soft tissue infection. Anticipate initiation of IV antibiotics and steroids as clinically appropriate.

## 2023-07-19 NOTE — H&P (Signed)
 Date: 07/19/2023               Patient Name:  [name] MRN: [id number]  DOB: Dec 22, 1975 Age / Sex: 48 y.o., male   PCP: Renne Homans, MD         Medical Service: Internal Medicine Teaching Service         Attending Physician: Dr. MICAEL Riis Winfrey      First Contact: Schuyler Novak, DO    Second Contact: Dr. Hadassah Kristy Ahr, MD          Pager Information: First Contact Pager: [phone]   Second Contact Pager: [phone]   SUBJECTIVE   Chief Complaint: left hand and arm pain  History of Present Illness: Alyxander Kelley is a 48 y.o. male with PMH of insulin  dependent DM, HFpEF, HTN, gout, and CKD Stage 4 who presented to the ED after [facility] for increasingly worsening left hand pain. The patient boxes for exercise and reports having boxed this past Saturday, when he noticed his left index finger become sore. He states that the pain quickly progressed to his left middle finger the same day, then progressively advanced to his wrist, and now has associated elbow pain. He states his left hand and forearm have swollen up, and are extremely tender to the touch. He cannot move his left upper extremity much without 9/10 pain. He states he has a history of gout, of which he tries to avoid red meats, alcohol, or other trigger foods. He is adherent to all his medications and takes Allopurinol  as prescribed. He denies any trauma or open wounds to his left hand with boxing, but admits he had 1 shot of tequila on Saturday to celebrate his brother's birthday. He admits he also felt chills and slightly feverish earlier today, but feels better currently. Denies SOB, chest pain, headache, nausea or vomiting.   ED Course:  ED was concerned for sepsis upon initial presentation, as the patient was SIRS positive. Patient received CT of left forearm to r/o deep space infection. Orthopedics was consulted for potential septic joint space, but were able to r/o acute infection and believe this  to be an acute gout flare.   Labs significant for Uric acid 9.2, down from 10.3 05/2023. WBC 13.5 with left shift 11.2.  Imaging: CXR: no evidence of active pulmonary disease CT Left Forearm wo contrast: no evidence of acute fracture, focal bone lesions, or osteomyelitis  Received IV Vancomycin  2g, Zosyn  3.375g, and colchicine  1.2mg  tablet.   Consulted Dr. Murrell, Orthopedics  Meds:  Patient reported:  Allopurinol  100mg  every other day Amlodipine  10mg  night Apixaban  5mg  BID Carvedilol  25mg  BID  Clonidine  0.20mq/24 hr patch, once weekly on Thursdays Furosemide  80mg  daily Furosemide  20mg  daily  Gabapentin  100mg , 2 AM, 1 afternoon, 2 PM Hydralazine  100mg  TID Lovastatin  20mg  daily Tresiba  36u every morning Novolog  Flexpen 100u, SSI Oxybutynin  5mg  daily Tamsulosin  0.4mg  nightly Tirzepatide  15mg  once weekly  Past Medical History Insulin -dependent DM Gout CKD Stage 4 HLD OSA Right sided AKA Prior DVT and PE, on chronic anticoagulation, last clot in 2013 HTN Diastolic Heart Failure Diabetic Neuropathy   Past Surgical History Past Surgical History:  Procedure Laterality Date   AMPUTATION Right 12/22/2013   Procedure: AMPUTATION BELOW KNEE;  Surgeon: Norleen Armor, MD;  Location: [facility] OR;  Service: Orthopedics;  Laterality: Right;   AMPUTATION Right 02/19/2014   Procedure: RIGHT ABOVE KNEE AMPUTATION ;  Surgeon: Norleen Armor, MD;  Location: [facility] OR;  Service: Orthopedics;  Laterality:  Right;   blood clot removal  2016   CARDIAC CATHETERIZATION  02/18/2013   normal coronaries   ESOPHAGOGASTRODUODENOSCOPY N/A 11/02/2016   Procedure: ESOPHAGOGASTRODUODENOSCOPY (EGD);  Surgeon: Avram Lupita BRAVO, MD;  Location: [facility] ENDOSCOPY;  Service: Endoscopy;  Laterality: N/A;   I & D EXTREMITY Right 12/14/2013   Procedure: IRRIGATION AND DEBRIDEMENT RIGHT FOOT;  Surgeon: Elspeth JONELLE Her, MD;  Location: [facility] OR;  Service: Orthopedics;  Laterality: Right;   INCISION AND DRAINAGE ABSCESS  2007; 2015    back   INCISION AND DRAINAGE OF WOUND Right 12/22/2013   Procedure: I&D RIGHT BUTTOCK;  Surgeon: Norleen Armor, MD;  Location: [facility] OR;  Service: Orthopedics;  Laterality: Right;   LEFT HEART CATHETERIZATION WITH CORONARY ANGIOGRAM N/A 02/18/2013   Procedure: LEFT HEART CATHETERIZATION WITH CORONARY ANGIOGRAM;  Surgeon: Peter M Swaziland, MD;  Location: [facility] CATH LAB;  Service: Cardiovascular;  Laterality: N/A;     Social:  Lives at home alone Support: siblings, girlfriend in [place] PCP:  Renne Homans, MD  Substances: -Tobacco: denies -Alcohol: current, 1-2 drinks every few months -Recreational Drug: denies  Family History:  Family History  Problem Relation Age of Onset   Heart disease Mother    Diabetes Mother    Diabetes Maternal Uncle    Diabetes Cousin      Allergies: Allergies as of 07/19/2023 - Review Complete 07/19/2023  Allergen Reaction Noted   Reglan  [metoclopramide ] Other (See Comments) 11/01/2016   Chlorthalidone Hives 04/29/2023   Metformin  and related Diarrhea 08/19/2019   Nsaids Other (See Comments) 04/27/2023   Ozempic  (0.25 or 0.5 mg-dose) [semaglutide (0.25 or 0.5mg -dos)] Nausea And Vomiting 08/19/2019   Thiazide-type diuretics Other (See Comments) 02/24/2023    Review of Systems: A complete ROS was negative except as per HPI.   OBJECTIVE:   Physical Exam: Blood pressure 135/69, pulse 100, temperature (!) 101.4 F (38.6 C), temperature source Axillary, resp. rate 17, height 6' 2 (1.88 m), weight (!) 145.6 kg, SpO2 91%.  Constitutional: pleasant, well-appearing male laying in bed, in no acute distress HENT: normocephalic atraumatic, mucous membranes moist Eyes: conjunctiva non-erythematous Neck: supple Cardiovascular: regular rate and rhythm, no m/r/g Pulmonary/Chest: normal work of breathing on room air, lungs clear to auscultation bilaterally Abdominal: soft, non-tender, non-distended MSK: right-sided AKA, left upper extremity swelling and erythema  with warmth to the touch up to the elbow, no shoulder involvement. Tenderness on palpation, reduced ROM on left wrist and elbow.  No erythematous tracking up the arm. Neurological: alert & oriented x 3, no focal deficits Skin: warm and dry Psych: appropriate  Labs: CBC    Component Value Date/Time   WBC 13.5 (H) 07/19/2023 1751   RBC 4.47 07/19/2023 1751   HGB 12.7 (L) 07/19/2023 1751   HGB 9.2 (L) 04/24/2018 1040   HCT 40.4 07/19/2023 1751   HCT 28.4 (L) 04/24/2018 1040   PLT 252 07/19/2023 1751   PLT 359 04/24/2018 1040   MCV 90.4 07/19/2023 1751   MCV 85 04/24/2018 1040   MCH 28.4 07/19/2023 1751   MCHC 31.4 07/19/2023 1751   RDW 15.7 (H) 07/19/2023 1751   RDW 14.1 04/24/2018 1040   LYMPHSABS 0.9 07/19/2023 1751   LYMPHSABS 1.7 04/24/2018 1040   MONOABS 1.3 (H) 07/19/2023 1751   EOSABS 0.1 07/19/2023 1751   EOSABS 0.3 04/24/2018 1040   BASOSABS 0.0 07/19/2023 1751   BASOSABS 0.0 04/24/2018 1040     CMP     Component Value Date/Time   NA 136 07/19/2023 1751  NA 137 06/03/2019 0948   K 5.3 (H) 07/19/2023 1751   CL 104 07/19/2023 1751   CO2 20 (L) 07/19/2023 1751   GLUCOSE 137 (H) 07/19/2023 1751   BUN 63 (H) 07/19/2023 1751   BUN 63 (H) 06/03/2019 0948   CREATININE 3.41 (H) 07/19/2023 1751   CREATININE 1.78 (H) 12/09/2013 1559   CALCIUM  9.0 07/19/2023 1751   CALCIUM  9.1 04/26/2023 0810   PROT 7.3 07/19/2023 1751   ALBUMIN 3.6 07/19/2023 1751   AST 19 07/19/2023 1751   ALT 11 07/19/2023 1751   ALKPHOS 61 07/19/2023 1751   BILITOT 1.1 07/19/2023 1751   GFRNONAA 22 (L) 07/19/2023 1751   GFRNONAA 48 (L) 12/09/2013 1559   GFRAA 16 (L) 10/06/2019 1337   GFRAA 55 (L) 12/09/2013 1559    Imaging: CT FOREARM LEFT WO CONTRAST Result Date: 07/19/2023 CLINICAL DATA:  Deep space infection in the hand, forearm, and elbow EXAM: CT OF THE LEFT FOREARM WITHOUT CONTRAST TECHNIQUE: Multidetector CT imaging was performed according to the standard protocol. Multiplanar CT  image reconstructions were also generated. RADIATION DOSE REDUCTION: This exam was performed according to the departmental dose-optimization program which includes automated exposure control, adjustment of the mA and/or kV according to patient size and/or use of iterative reconstruction technique. COMPARISON:  Left hand radiograph 07/19/2023. Left elbow and forearm radiographs 08/31/2022. FINDINGS: Bones/Joint/Cartilage Motion artifact. Bones appear intact. No evidence of acute fracture or dislocation. No focal bone lesion or bone destruction. No cortical changes to suggest osteomyelitis. Ligaments Suboptimally assessed by CT. Muscles and Tendons Streak artifact and motion artifact limits evaluation. No intramuscular mass or hematoma identified. Soft tissues No loculated collections. IMPRESSION: 1. Technically limited study due to motion artifact and streak artifact. 2. No acute bony abnormalities are seen. 3. No loculated collection to suggest abscess. Consider ultrasound or MRI for better evaluation of the forearm soft tissues if clinically indicated. Electronically Signed   By: Elsie Gravely M.D.   On: 07/19/2023 19:30   DG Chest Port 1 View Result Date: 07/19/2023 CLINICAL DATA:  Infection. Arm swelling starting on Sunday and progressively worsening. Fever today. History of gout and cellulitis. EXAM: PORTABLE CHEST 1 VIEW COMPARISON:  03/13/2023 FINDINGS: Shallow inspiration. Heart size and pulmonary vascularity are normal for technique and unchanged. Lungs are clear. No pleural effusion or pneumothorax. Mediastinal contours appear intact. IMPRESSION: Shallow inspiration.  No evidence of active pulmonary disease. Electronically Signed   By: Elsie Gravely M.D.   On: 07/19/2023 19:07     ASSESSMENT & PLAN:   Assessment & Plan by Problem: Principal Problem:   Acute gout   Norville Pagliuca is a 48 y.o. person living with a history of gout, insulin -dependent DM, HTN, HLD, and CHF who presented with  worsening left upper extremity pain and admitted for acute gout flare and pain management on 7/10.   Acute Gout Flare Patient c/o worsening left hand, wrist, and elbow pain since Saturday. Worked up for sepsis on initial ED presentation due to being SIRS positive (tachycardia, febrile, and leukocytosis). Code sepsis called. WBC 13.5, HR 100, temp 101.4. Blood cultures drawn, and was placed on IV Vancomycin  2g and Zosyn  3.375g. Lactic acid normal 0.9. Orthopedics hand surgery, Dr. Murrell, consulted for potential infection to the LUE. After evaluation and CT, ruled out deep space infection. Orthopedics believes this to be an acute gout flare.  Uric acid drawn, 9.2.  Given Colchicine  1.2mg  tablet in ED. Given patient's history of CKD Stage 4 (GFR 22 on admission),  patient is likely subtherapeutic with home allopurinol . Likely would benefit from dose escalation of Allopurinol  despite severe CKD, or trialing Febuxostat however he has concurrent heart failure.  Currently improved after IV antibiotics and colchicine .  He has significant blood sugar effects when on steroids.  If there is any stagnation of his improvement, we will start low-dose prednisone . If confident in AM about r/o'd infection, consider orthopedics to give intraarticular glucocorticoid injection. - Monitor BMP - F/u blood cultures - Continue Vanc and Zosyn  - Ortho to reevaluate in the morning  Insulin  Dependent Diabetes Mellitus Conflicting reports of type 1 versus type 2 DM. Previous antibody testing in 2016 negative, but patient reported positive antibody testing for glutamic acid decarboxylase antibody and islet cell antibodies before 2016. Patient well controlled on Tresiba  36u and Novolog  Flexpen. Also on Mounjaro . Last A1c 4.9 on 03/27/2023. Last seen by endrocrinology in May 2024, at which time, they called his DM type 2.  - Continue Semglee  36 units daily and moderate SSI  CKD Stage 4 Pyuria Creatinine at baseline.  As above  Therapies are limited by CKD.  UA shows large amount of leukocytes, rare bacteria, over 50 white blood cells, and 6-10 squamous epithelial cells.  No UTI symptoms noted.  If he does note any on recommend repeating urinalysis to get a clean-catch. - Monitor BMP  HTN  Patient on home Amlodipine  10mg , Carvedilol  25mg  BID, Hydralazine  100mg  TID, and Clonidine  0.1mg  patch weekly (Thursdays). BP in ED 130/67. - Continue home medications  HLD Patient well controlled on home Lovastatin  20mg . Last lipid panel on 12/2021. Total cholesterol 119, LDL 60, HDL 38.1. - Continue home Lovastatin  20mg   HFpEF Patient on home Amlodipine  10mg , Carvedilol  25mg  BID, Furosemide  100mg  (both 80mg  daily and 20mg  daily), and Hydralazine  100mg  TID. Last echocardiogram in 03/2023 shows LVEF 55-60% with some LVH hypertrophy, moderate mitral regurgitation and calcification.  - Continue home meds - Monitor I&Os  Diabetic Neuropathy  Patient takes home gabapentin  for diabetic neuropathy of left lower extremity. - Continue gabapentin  200/100/200 mg TID  Best practice: Diet: Normal VTE: Apixaban  Code: Full  Disposition planning: Prior to Admission Living Arrangement: Home, living alone with local family and girlfriend support Anticipated Discharge Location: Home  Dispo: Admit patient to Observation with expected length of stay less than 2 midnights.  Signed: Endy Easterly, DO Internal Medicine Resident, PGY-1 Please contact the on call pager at [phone] for any urgent or emergent needs. 10:30 PM 07/19/2023

## 2023-07-19 NOTE — ED Provider Notes (Signed)
 [place] [facility]GENC[facility] DEPARTMENT AT [facility] Provider Note   CSN: [id number] Arrival date & time: 07/19/23  1741     History  Chief Complaint  Patient presents with   Arm Swelling    [name] is a 48 y.o. male with PMH as listed below who presents with L arm swelling that started Sunday, progressively getting worse with fever starting today. The patient boxes for exercise and reports having boxed this past Saturday, when he noticed his left index finger become sore. He states that the pain quickly progressed to his left middle finger the same day, then progressively advanced to his wrist, and now has associated elbow pain. Hx of gout and patient states this is the same. H/o right leg BKA. Dr office reported temp of 100.1 F. Endorse nausea as well with no vomiting.   BP 167/80 HR 100 RR 26 EtCo2 32 96% RA  20 R AC, got 100 mcg fentanyl , 100 mL NS.SABRA    Past Medical History:  Diagnosis Date   Acute venous embolism and thrombosis of deep vessels of proximal lower extremity (HCC) 07/19/2011   Anemia    Anginal pain (HCC)    pt denies   Anxiety    Chest pain, neg MI, normal coronaries by cath 02/18/2013   pt denies   CHF (congestive heart failure) (HCC)    Chronic renal disease, stage IV (HCC) 02/19/2013   Colon polyps    Depression    Diabetic ulcer of right foot (HCC)    DVT (deep venous thrombosis) (HCC) 09/2002   patient reports additional DVTs in '06 & '11 (unconfirmed)   ED (erectile dysfunction)    GERD (gastroesophageal reflux disease)    History of blood transfusion    related to OR (10/31/2016)   Hyperlipidemia 02/19/2013   Hypertension    Nausea & vomiting    constant for the last couple weeks (10/31/2016)   Nephrotic syndrome    Obesity    BMI 44, weight 346 pounds 01/30/14   OSA (obstructive sleep apnea) 02/28/2018   Mild obstructive sleep apnea with an AHI of 9.8/h but severe during REM sleep with an AHI of 33.8/h.  Oxygen  saturations  dropped to 86% and there was moderate snoring   Peripheral edema    Pneumonia    Prosthesis adjustment 08/17/2016   Pulmonary embolism (HCC) 09/2002   treated with 6 months of warfarin   Pyelonephritis 02/02/2018   Type I diabetes mellitus (HCC) dx'd 2001   Urinary retention        Home Medications Prior to Admission medications   Medication Sig Start Date End Date Taking? Authorizing Provider  acetaminophen  (TYLENOL ) 325 MG tablet Take 2 tablets (650 mg total) by mouth every 6 (six) hours. Patient taking differently: Take 650 mg by mouth every 6 (six) hours as needed for mild pain (pain score 1-3). 02/26/23   Kandis Perkins, DO  allopurinol  (ZYLOPRIM ) 100 MG tablet Take 0.5 tablets (50 mg total) by mouth every other day. 03/27/23   Gabino Boga, MD  amLODipine  (NORVASC ) 10 MG tablet Take 10 mg by mouth at bedtime.    [provider]  apixaban  (ELIQUIS ) 5 MG TABS tablet Take 1 tablet (5 mg total) by mouth 2 (two) times daily. 07/11/23   Amoako, Prince, MD  carvedilol  (COREG ) 25 MG tablet TAKE 1 TABLET BY MOUTH TWICE DAILY WITH A MEAL 06/26/23   Amoako, Prince, MD  cloNIDine  (CATAPRES  - DOSED IN MG/24 HR) 0.1 mg/24hr patch Place 0.1  mg onto the skin once a week. Thursdays 05/04/21   [provider]  Continuous Glucose Receiver (FREESTYLE LIBRE 2 READER) DEVI Use with sensors to monitor blood glucose 05/15/23   Thapa, [place], MD  Continuous Glucose Receiver (FREESTYLE LIBRE 2 READER) DEVI 1 each by Does not apply route daily. 05/15/23   Thapa, [place], MD  Continuous Glucose Sensor (FREESTYLE LIBRE 2 SENSOR) MISC Change every 14 days as directed by manufacturer 05/08/23   Thapa, [place], MD  diclofenac  Sodium (VOLTAREN ) 1 % GEL Apply 2 g topically 4 (four) times daily. Patient taking differently: Apply 2 g topically daily as needed (for pain). 01/31/21   Carolynn Standing, MD  fluticasone  (FLONASE ) 50 MCG/ACT nasal spray Place 2 sprays into both nostrils daily. Patient taking  differently: Place 2 sprays into both nostrils daily as needed for allergies. 03/29/20 02/23/24  Carolynn Standing, MD  furosemide  (LASIX ) 80 MG tablet Take 1 tablet (80 mg total) by mouth 2 (two) times daily. Patient taking differently: Take 100 mg by mouth 2 (two) times daily. Patient states he saw [facility] 05/17/23 and states this was increased to 100mg  2x/day 03/16/23   Kandis Perkins, DO  gabapentin  (NEURONTIN ) 100 MG capsule TAKE 2 CAPSULE BY MOUTH IN THE MORNING, 1 IN THE AFTERNOON AND 2 NIGHTLY 06/11/23   Zheng, Michael, DO  hydrALAZINE  (APRESOLINE ) 100 MG tablet Take 100 mg by mouth 3 (three) times daily. 07/13/20   [provider]  insulin  degludec (TRESIBA  FLEXTOUCH) 200 UNIT/ML FlexTouch Pen Inject 36 Units into the skin daily. Patient taking differently: Inject 35 Units into the skin daily. 05/01/23   Francella Rogue, MD  Insulin  Pen Needle 32G X 4 MM MISC Use as directed 05/01/23     lidocaine  (HM LIDOCAINE  PATCH) 4 % Place 1 patch onto the skin daily. 12/22/21   Teresa Carrier, MD  lovastatin  (MEVACOR ) 20 MG tablet Take 1 tablet by mouth once daily 05/08/23   Rosan Dayton BROCKS, DO  NOVOLOG  FLEXPEN 100 UNIT/ML FlexPen Inject 0-10 Units into the skin 3 (three) times daily with meals. Glucose 141-180: inject 4 units, Glucose 181-220: inject 6 units, Glucose 221-260: inject 8 units, Glucose 261-300: inject 10 units, If glucose if greater than 300, please inject 12 units and call your PCP and/or endocrinologist office 03/16/23 06/14/23  Kandis Perkins, DO  oxybutynin  (DITROPAN ) 5 MG tablet Take 1 tablet (5 mg total) by mouth 3 (three) times daily. 07/17/23   Amoako, Prince, MD  oxycodone  (OXY-IR) 5 MG capsule Take 1 capsule (5 mg total) by mouth every 6 (six) hours as needed for up to 3 days. 07/18/23 07/21/23  Tawkaliyar, Roya, DO  pantoprazole  (PROTONIX ) 20 MG tablet Take 1 tablet (20 mg total) by mouth daily. Patient not taking: Reported on 05/17/2023 02/27/23   Kandis Perkins, DO  tamsulosin  (FLOMAX ) 0.4  MG CAPS capsule Take 1 capsule by mouth at bedtime 05/08/23   Rosan Dayton BROCKS, DO  tirzepatide  (MOUNJARO ) 15 MG/0.5ML Pen Inject 15 mg into the skin once a week. 09/05/22   Thapa, [place], MD      Allergies    Reglan  [metoclopramide ], Chlorthalidone, Metformin  and related, Nsaids, Ozempic  (0.25 or 0.5 mg-dose) [semaglutide (0.25 or 0.5mg -dos)], and Thiazide-type diuretics    Review of Systems   Review of Systems A 10 point review of systems was performed and is negative unless otherwise reported in HPI.  Physical Exam Updated Vital Signs BP 135/69   Pulse 100   Temp (!) 101.4 F (38.6 C) (Axillary)  Resp 17   Ht 6' 2 (1.88 m)   Wt (!) 145.6 kg   SpO2 91%   BMI 41.21 kg/m  Physical Exam General: Uncomfortable appearing male, lying in bed.  HEENT: Sclera anicteric, MMM, trachea midline.  Cardiology: RRR, no murmurs/rubs/gallops. BL radial pulses equal bilaterally.  Resp: Normal respiratory rate and effort. CTAB, no wheezes, rhonchi, crackles.  Abd: Soft, non-tender, non-distended. No rebound tenderness or guarding.  GU: Deferred. MSK: Diffuse erythema and significant TTP throughout LUE up to elbow. L hand swollen with diffuse swelling of all fingers/hand/wrist/forearm. Limited active ROM and passive ROM due to pain. Swollen but soft compartments.No peripheral edema or signs of trauma. Extremities without deformity or TTP. No cyanosis or clubbing. Skin: warm, dry.  Neuro: A&Ox4, CNs II-XII grossly intact. MAEs. Sensation grossly intact.  Psych: Normal mood and affect.   ED Results / Procedures / Treatments   Labs (all labs ordered are listed, but only abnormal results are displayed) Labs Reviewed  COMPREHENSIVE METABOLIC PANEL WITH GFR - Abnormal; Notable for the following components:      Result Value   Potassium 5.3 (*)    CO2 20 (*)    Glucose, Bld 137 (*)    BUN 63 (*)    Creatinine, Ser 3.41 (*)    GFR, Estimated 22 (*)    All other components within normal limits  CBC  WITH DIFFERENTIAL/PLATELET - Abnormal; Notable for the following components:   WBC 13.5 (*)    Hemoglobin 12.7 (*)    RDW 15.7 (*)    Neutro Abs 11.2 (*)    Monocytes Absolute 1.3 (*)    All other components within normal limits  RESP PANEL BY RT-PCR (RSV, FLU A&B, COVID)  RVPGX2  CULTURE, BLOOD (ROUTINE X 2)  CULTURE, BLOOD (ROUTINE X 2)  URINALYSIS, W/ REFLEX TO CULTURE (INFECTION SUSPECTED)  PROTIME-INR  I-STAT CG4 LACTIC ACID, ED    EKG None  Radiology DG Chest Port 1 View Result Date: 07/19/2023 CLINICAL DATA:  Infection. Arm swelling starting on Sunday and progressively worsening. Fever today. History of gout and cellulitis. EXAM: PORTABLE CHEST 1 VIEW COMPARISON:  03/13/2023 FINDINGS: Shallow inspiration. Heart size and pulmonary vascularity are normal for technique and unchanged. Lungs are clear. No pleural effusion or pneumothorax. Mediastinal contours appear intact. IMPRESSION: Shallow inspiration.  No evidence of active pulmonary disease. Electronically Signed   By: Elsie Gravely M.D.   On: 07/19/2023 19:07    Procedures .Critical Care  Performed by: Franklyn Sid SAILOR, MD Authorized by: Franklyn Sid SAILOR, MD   Critical care provider statement:    Critical care time (minutes):  30   Critical care was necessary to treat or prevent imminent or life-threatening deterioration of the following conditions:  Sepsis   Critical care was time spent personally by me on the following activities:  Development of treatment plan with patient or surrogate, discussions with consultants, evaluation of patient's response to treatment, examination of patient, ordering and review of laboratory studies, ordering and review of radiographic studies, ordering and performing treatments and interventions, pulse oximetry, re-evaluation of patient's condition, review of old charts and obtaining history from patient or surrogate   Care discussed with: admitting provider       Medications Ordered in  ED Medications  lactated ringers  infusion (has no administration in time range)  lactated ringers  bolus 1,000 mL (1,000 mLs Intravenous New Bag/Given 07/19/23 1826)  vancomycin  (VANCOREADY) IVPB 2000 mg/400 mL (has no administration in time range)  piperacillin -tazobactam (ZOSYN )  IVPB 3.375 g (3.375 g Intravenous New Bag/Given 07/19/23 1839)    Followed by  piperacillin -tazobactam (ZOSYN ) IVPB 3.375 g (has no administration in time range)  HYDROmorphone  (DILAUDID ) injection 1 mg (1 mg Intravenous Given 07/19/23 1837)  ondansetron  (ZOFRAN ) injection 4 mg (4 mg Intravenous Given 07/19/23 1837)  acetaminophen  (TYLENOL ) tablet 1,000 mg (1,000 mg Oral Given 07/19/23 1852)    ED Course/ Medical Decision Making/ A&P                          Medical Decision Making Amount and/or Complexity of Data Reviewed Labs: ordered. Decision-making details documented in ED Course. Radiology: ordered. Decision-making details documented in ED Course.  Risk OTC drugs. Prescription drug management. Decision regarding hospitalization.    This patient presents to the ED for concern of LUE pain, this involves an extensive number of treatment options, and is a complaint that carries with it a high risk of complications and morbidity.  I considered the following differential and admission for this acute, potentially life threatening condition.   MDM:    Consider LUE infection vs gout attack. He is +SIRS so called code sepsis, gave fluids/abx. Obtained CT forearm to r/o deep space infection which was reassuring against abscess/fluid collection. Will d/w orthopedic surgery.  Clinical Course as of 07/21/23 1919  Thu Jul 19, 2023  1756 Temp(!): 101.4 F (38.6 C) [HN]  1805 WBC(!): 13.5 +leukocytosis. With mild tachycardia, have ordered blood cultures and called code sepsis. Giving vanc/zosyn . [HN]  1807 Consulted to hand surgery [HN]  1918 Dr. Murrell came to evaluate the patient and recommends the CT arm as well  as serum uric acid and colchicine  treatment for suspected gout [HN]  1919 Lactic Acid, Venous: 0.9 [HN]  2033 CT FOREARM LEFT WO CONTRAST 1. Technically limited study due to motion artifact and streak artifact. 2. No acute bony abnormalities are seen. 3. No loculated collection to suggest abscess. Consider ultrasound or MRI for better evaluation of the forearm soft tissues if clinically indicated.   [HN]  2033 D/w Dr. Kuzma again about patient and CT results. Uric acid high and CT reassuring. Patient treated w/ colchicine  and will be admitted to medicine for pain control. [HN]  2107 Urinalysis, w/ Reflex to Culture (Infection Suspected) -Urine, Clean Catch(!) +UTI, already treated w/ vanc/zosyn  [HN]    Clinical Course User Index [HN] Franklyn Sid SAILOR, MD    Labs: I Ordered, and personally interpreted labs.  The pertinent results include:  those listed above  Imaging Studies ordered: I ordered imaging studies including CT LUE I independently visualized and interpreted imaging. I agree with the radiologist interpretation  Additional history obtained from chart review.    Cardiac Monitoring: The patient was maintained on a cardiac monitor.  I personally viewed and interpreted the cardiac monitored which showed an underlying rhythm of: NSR, intermittent   Reevaluation: After the interventions noted above, I reevaluated the patient and found that they have :improved  Social Determinants of Health: Lives independently  Disposition:  Admit to medicine  Co morbidities that complicate the patient evaluation  Past Medical History:  Diagnosis Date   Acute venous embolism and thrombosis of deep vessels of proximal lower extremity (HCC) 07/19/2011   Anemia    Anginal pain (HCC)    pt denies   Anxiety    Chest pain, neg MI, normal coronaries by cath 02/18/2013   pt denies   CHF (congestive heart failure) (HCC)    Chronic  renal disease, stage IV (HCC) 02/19/2013   Colon polyps     Depression    Diabetic ulcer of right foot (HCC)    DVT (deep venous thrombosis) (HCC) 09/2002   patient reports additional DVTs in '06 & '11 (unconfirmed)   ED (erectile dysfunction)    GERD (gastroesophageal reflux disease)    History of blood transfusion    related to OR (10/31/2016)   Hyperlipidemia 02/19/2013   Hypertension    Nausea & vomiting    constant for the last couple weeks (10/31/2016)   Nephrotic syndrome    Obesity    BMI 44, weight 346 pounds 01/30/14   OSA (obstructive sleep apnea) 02/28/2018   Mild obstructive sleep apnea with an AHI of 9.8/h but severe during REM sleep with an AHI of 33.8/h.  Oxygen  saturations dropped to 86% and there was moderate snoring   Peripheral edema    Pneumonia    Prosthesis adjustment 08/17/2016   Pulmonary embolism (HCC) 09/2002   treated with 6 months of warfarin   Pyelonephritis 02/02/2018   Type I diabetes mellitus (HCC) dx'd 2001   Urinary retention      Medicines Meds ordered this encounter  Medications   lactated ringers  infusion   lactated ringers  bolus 1,000 mL    Reason 30 mL/kg dose is not being ordered:   First Lactic Acid Pending   vancomycin  (VANCOREADY) IVPB 2000 mg/400 mL    Indication::   Sepsis   FOLLOWED BY Linked Order Group    piperacillin -tazobactam (ZOSYN ) IVPB 3.375 g     Antibiotic Indication::   Sepsis    piperacillin -tazobactam (ZOSYN ) IVPB 3.375 g   HYDROmorphone  (DILAUDID ) injection 1 mg   ondansetron  (ZOFRAN ) injection 4 mg   acetaminophen  (TYLENOL ) tablet 1,000 mg    I have reviewed the patients home medicines and have made adjustments as needed  Problem List / ED Course: Problem List Items Addressed This Visit       Endocrine   Insulin  dependent type 2 diabetes mellitus, controlled (HCC) (Chronic)   Relevant Medications   insulin  degludec (TRESIBA  FLEXTOUCH) 200 UNIT/ML FlexTouch Pen     Other   * (Principal) Acute gout - Primary   Other Visit Diagnoses       Acute cystitis  without hematuria         SIRS (systemic inflammatory response syndrome) (HCC)         Left arm pain                       This note was created using dictation software, which may contain spelling or grammatical errors.    Franklyn Sid SAILOR, MD 07/21/23 [phone]

## 2023-07-19 NOTE — ED Notes (Addendum)
 [place] 0.9 within normal limits 2nd not needed can be discontinued

## 2023-07-19 NOTE — Progress Notes (Signed)
 CC: left hand and forearm pain and swelling   HPI:  [name] is a 48 year old male with relevant medical history as noted below presents today for follow-up regarding left hand and forearm pain and swelling that began approximately 4 days ago.  The patient was seen yesterday for localized swelling and pain of the left index finger, at which time an X-ray was ordered and Oxycodone  prescribed for acute pain management.  However, he returned today with worsening symptoms, now involving diffuse swelling and pain of the left hand and forearm. He reports progression of symptoms despite initial treatment. The swelling is associated with increased discomfort and reduced range of motion.  Past Medical History:  Diagnosis Date   Acute venous embolism and thrombosis of deep vessels of proximal lower extremity (HCC) 07/19/2011   Anemia    Anginal pain (HCC)    pt denies   Anxiety    Chest pain, neg MI, normal coronaries by cath 02/18/2013   pt denies   CHF (congestive heart failure) (HCC)    Chronic renal disease, stage IV (HCC) 02/19/2013   Colon polyps    Depression    Diabetic ulcer of right foot (HCC)    DVT (deep venous thrombosis) (HCC) 09/2002   patient reports additional DVTs in '06 & '11 (unconfirmed)   ED (erectile dysfunction)    GERD (gastroesophageal reflux disease)    History of blood transfusion    related to OR (10/31/2016)   Hyperlipidemia 02/19/2013   Hypertension    Nausea & vomiting    constant for the last couple weeks (10/31/2016)   Nephrotic syndrome    Obesity    BMI 44, weight 346 pounds 01/30/14   OSA (obstructive sleep apnea) 02/28/2018   Mild obstructive sleep apnea with an AHI of 9.8/h but severe during REM sleep with an AHI of 33.8/h.  Oxygen  saturations dropped to 86% and there was moderate snoring   Peripheral edema    Pneumonia    Prosthesis adjustment 08/17/2016   Pulmonary embolism (HCC) 09/2002   treated with 6 months of warfarin    Pyelonephritis 02/02/2018   Type I diabetes mellitus (HCC) dx'd 2001   Urinary retention     Current Outpatient Medications on File Prior to Visit  Medication Sig Dispense Refill   acetaminophen  (TYLENOL ) 325 MG tablet Take 2 tablets (650 mg total) by mouth every 6 (six) hours. (Patient taking differently: Take 650 mg by mouth every 6 (six) hours as needed for mild pain (pain score 1-3).) 30 tablet 0   allopurinol  (ZYLOPRIM ) 100 MG tablet Take 0.5 tablets (50 mg total) by mouth every other day. 30 tablet 2   amLODipine  (NORVASC ) 10 MG tablet Take 10 mg by mouth at bedtime.     apixaban  (ELIQUIS ) 5 MG TABS tablet Take 1 tablet (5 mg total) by mouth 2 (two) times daily. 180 tablet 0   carvedilol  (COREG ) 25 MG tablet TAKE 1 TABLET BY MOUTH TWICE DAILY WITH A MEAL 180 tablet 0   cloNIDine  (CATAPRES  - DOSED IN MG/24 HR) 0.1 mg/24hr patch Place 0.1 mg onto the skin once a week. Thursdays     Continuous Glucose Receiver (FREESTYLE LIBRE 2 READER) DEVI Use with sensors to monitor blood glucose 1 each 0   Continuous Glucose Receiver (FREESTYLE LIBRE 2 READER) DEVI 1 each by Does not apply route daily. 1 each 0   Continuous Glucose Sensor (FREESTYLE LIBRE 2 SENSOR) MISC Change every 14 days as directed by manufacturer 6 each 3  diclofenac  Sodium (VOLTAREN ) 1 % GEL Apply 2 g topically 4 (four) times daily. (Patient taking differently: Apply 2 g topically daily as needed (for pain).) 100 g 3   fluticasone  (FLONASE ) 50 MCG/ACT nasal spray Place 2 sprays into both nostrils daily. (Patient taking differently: Place 2 sprays into both nostrils daily as needed for allergies.) 15.8 mL 2   furosemide  (LASIX ) 80 MG tablet Take 1 tablet (80 mg total) by mouth 2 (two) times daily. (Patient taking differently: Take 100 mg by mouth 2 (two) times daily. Patient states he saw [facility] 05/17/23 and states this was increased to 100mg  2x/day) 30 tablet 1   gabapentin  (NEURONTIN ) 100 MG capsule TAKE 2 CAPSULE BY MOUTH IN  THE MORNING, 1 IN THE AFTERNOON AND 2 NIGHTLY 300 capsule 2   hydrALAZINE  (APRESOLINE ) 100 MG tablet Take 100 mg by mouth 3 (three) times daily.     insulin  degludec (TRESIBA  FLEXTOUCH) 200 UNIT/ML FlexTouch Pen Inject 36 Units into the skin daily. (Patient taking differently: Inject 35 Units into the skin daily.) 15 mL 0   Insulin  Pen Needle 32G X 4 MM MISC Use as directed 100 each 0   lidocaine  (HM LIDOCAINE  PATCH) 4 % Place 1 patch onto the skin daily. 5 patch 0   lovastatin  (MEVACOR ) 20 MG tablet Take 1 tablet by mouth once daily 90 tablet 0   NOVOLOG  FLEXPEN 100 UNIT/ML FlexPen Inject 0-10 Units into the skin 3 (three) times daily with meals. Glucose 141-180: inject 4 units, Glucose 181-220: inject 6 units, Glucose 221-260: inject 8 units, Glucose 261-300: inject 10 units, If glucose if greater than 300, please inject 12 units and call your PCP and/or endocrinologist office 30 mL 4   oxybutynin  (DITROPAN ) 5 MG tablet Take 1 tablet (5 mg total) by mouth 3 (three) times daily. 90 tablet 1   oxycodone  (OXY-IR) 5 MG capsule Take 1 capsule (5 mg total) by mouth every 6 (six) hours as needed for up to 3 days. 12 capsule 0   pantoprazole  (PROTONIX ) 20 MG tablet Take 1 tablet (20 mg total) by mouth daily. (Patient not taking: Reported on 05/17/2023) 25 tablet 0   tamsulosin  (FLOMAX ) 0.4 MG CAPS capsule Take 1 capsule by mouth at bedtime 90 capsule 0   tirzepatide  (MOUNJARO ) 15 MG/0.5ML Pen Inject 15 mg into the skin once a week. 6 mL 4   No current facility-administered medications on file prior to visit.    Family History  Problem Relation Age of Onset   Heart disease Mother    Diabetes Mother    Diabetes Maternal Uncle    Diabetes Cousin     Social History   Socioeconomic History   Marital status: Single    Spouse name: Not on file   Number of children: 0   Years of education: 15.5   Highest education level: Some college, no degree  Occupational History   Occupation: Consulting civil engineer     Comment: [employer]   Occupation: disability  Tobacco Use   Smoking status: Never   Smokeless tobacco: Never  Vaping Use   Vaping status: Never Used  Substance and Sexual Activity   Alcohol use: Yes    Comment: 10/31/2016 a few beers q 6 months or so   Drug use: No   Sexual activity: Yes  Other Topics Concern   Not on file  Social History Narrative   Financial assistance approved for 100% discount at [facility] and has [facility] card per [employer]   09/01/2009.  Lives in [place] with mother and sister.            Social Drivers of Health   Financial Resource Strain: Medium Risk (01/31/2023)   Overall Financial Resource Strain (CARDIA)    Difficulty of Paying Living Expenses: Somewhat hard  Food Insecurity: No Food Insecurity (06/06/2023)   Hunger Vital Sign    Worried About Running Out of Food in the Last Year: Never true    Ran Out of Food in the Last Year: Never true  Transportation Needs: No Transportation Needs (06/06/2023)   PRAPARE - Administrator, Civil Service (Medical): No    Lack of Transportation (Non-Medical): No  Physical Activity: Sufficiently Active (01/31/2023)   Exercise Vital Sign    Days of Exercise per Week: 6 days    Minutes of Exercise per Session: 120 min  Stress: No Stress Concern Present (01/31/2023)   [employer] of Occupational Health - Occupational Stress Questionnaire    Feeling of Stress : Not at all  Social Connections: Socially Isolated (01/31/2023)   Social Connection and Isolation Panel    Frequency of Communication with Friends and Family: More than three times a week    Frequency of Social Gatherings with Friends and Family: More than three times a week    Attends Religious Services: Never    Database administrator or Organizations: No    Attends Banker Meetings: Never    Marital Status: Never married  Intimate Partner Violence: Not At Risk (06/06/2023)   Humiliation, Afraid, Rape, and Kick questionnaire     Fear of Current or Ex-Partner: No    Emotionally Abused: No    Physically Abused: No    Sexually Abused: No    Review of Systems: ROS negative except for what is noted on the assessment and plan.  Vitals:   07/19/23 1603  BP: (!) 143/74  Pulse: 91  Temp: 100 F (37.8 C)  TempSrc: Oral  SpO2: 99%    Physical Exam  Physical Exam: Constitutional: he is in severe pain, shivering and vomiting  Cardiovascular: regular rate and rhythm Pulmonary/Chest: normal work of breathing on room air, lungs clear to auscultation bilaterally but hard due to body habitus  Left Hand and Forearm: Erythematous and warm to touch, with noticeable swelling. Range of motion is limited due to pain. No signs of bite, wound, or open lesions observed. Neurological: alert & oriented x 3,   Assessment & Plan:   Swelling of left index finger Chief Complaint: Severe right hand and forearm pain with swelling, nausea, and vomiting.  History of Present Illness: The patient returned to the clinic today for worsening right hand and forearm pain. He was seen yesterday (07/18/23) for similar symptoms, POCUS suggested joint inflammation without abscess, medications were prescribed and XRAy was ordered.(Xray show no bone abnormality)  However, the patient reports no improvement and notes that the pain has significantly worsened overnight.  Today, he describes severe throbbing pain extending from his right hand up to his elbow, associated with inability to move the hand, nausea, and vomiting. He states that the prescribed medications were not helpful.  Review of Systems:  Positive: Pain, swelling, redness, nausea, vomiting, chills Negative: No visible ulcer or bite site noted  Objective:  Temperature: 100F Patient was visibly shivering and vomited in the clinic Right hand and forearm were swollen, erythematous, and warm to the touch No ulcerations or external wounds observed Limited range of motion due to  pain  Assessment:  Gout flare (suspected) Cellulitis Cannot rule out septic arthritis  Plan: Due to the acute worsening of symptoms, physical findings, and systemic signs (fever, vomiting), the patient was referred to the Emergency Department for immediate evaluation. Recommended further workup including joint aspiration, blood work, and imaging to rule out septic arthritis or deep soft tissue infection. Anticipate initiation of IV antibiotics and steroids as clinically appropriate.  Patient seen with Dr. CHARLENA Rosan Armando Bernadine M.D [facility], PGY-1 Phone: [phone] Date 07/19/2023 Time 5:06 PM

## 2023-07-19 NOTE — ED Triage Notes (Signed)
 Patient BIB [facility] from dr's office for arm swelling that started Sunday, progressively getting worse with fever starting today. Hx of gout and cellulitis, patient is right leg BKA. Dr office reported temp of 100.10F. BP 167/80 HR 100 RR 26 EtCo2 32 96% RA  20 R AC, got 100mcg fentanyl , 100mL NS.

## 2023-07-20 ENCOUNTER — Encounter (HOSPITAL_COMMUNITY)

## 2023-07-20 DIAGNOSIS — M109 Gout, unspecified: Secondary | ICD-10-CM | POA: Diagnosis not present

## 2023-07-20 LAB — CBC WITH DIFFERENTIAL/PLATELET
Abs Immature Granulocytes: 0.03 K/uL (ref 0.00–0.07)
Basophils Absolute: 0 K/uL (ref 0.0–0.1)
Basophils Relative: 0 %
Eosinophils Absolute: 0.1 K/uL (ref 0.0–0.5)
Eosinophils Relative: 0 %
HCT: 37.3 % — ABNORMAL LOW (ref 39.0–52.0)
Hemoglobin: 11.5 g/dL — ABNORMAL LOW (ref 13.0–17.0)
Immature Granulocytes: 0 %
Lymphocytes Relative: 10 %
Lymphs Abs: 1.1 K/uL (ref 0.7–4.0)
MCH: 28.1 pg (ref 26.0–34.0)
MCHC: 30.8 g/dL (ref 30.0–36.0)
MCV: 91.2 fL (ref 80.0–100.0)
Monocytes Absolute: 1.7 K/uL — ABNORMAL HIGH (ref 0.1–1.0)
Monocytes Relative: 15 %
Neutro Abs: 8.4 K/uL — ABNORMAL HIGH (ref 1.7–7.7)
Neutrophils Relative %: 75 %
Platelets: 229 K/uL (ref 150–400)
RBC: 4.09 MIL/uL — ABNORMAL LOW (ref 4.22–5.81)
RDW: 15.4 % (ref 11.5–15.5)
WBC: 11.4 K/uL — ABNORMAL HIGH (ref 4.0–10.5)
nRBC: 0 % (ref 0.0–0.2)

## 2023-07-20 LAB — BASIC METABOLIC PANEL WITH GFR
Anion gap: 14 (ref 5–15)
BUN: 62 mg/dL — ABNORMAL HIGH (ref 6–20)
CO2: 21 mmol/L — ABNORMAL LOW (ref 22–32)
Calcium: 8.8 mg/dL — ABNORMAL LOW (ref 8.9–10.3)
Chloride: 103 mmol/L (ref 98–111)
Creatinine, Ser: 3.41 mg/dL — ABNORMAL HIGH (ref 0.61–1.24)
GFR, Estimated: 22 mL/min — ABNORMAL LOW (ref >60)
Glucose, Bld: 97 mg/dL (ref 70–99)
Potassium: 4.1 mmol/L (ref 3.5–5.1)
Sodium: 138 mmol/L (ref 135–145)

## 2023-07-20 LAB — GLUCOSE, CAPILLARY
Glucose-Capillary: 148 mg/dL — ABNORMAL HIGH (ref 70–99)
Glucose-Capillary: 176 mg/dL — ABNORMAL HIGH (ref 70–99)
Glucose-Capillary: 251 mg/dL — ABNORMAL HIGH (ref 70–99)

## 2023-07-20 LAB — CBG MONITORING, ED: Glucose-Capillary: 86 mg/dL (ref 70–99)

## 2023-07-20 MED ORDER — INSULIN ASPART 100 UNIT/ML IJ SOLN
0.0000 [IU] | Freq: Three times a day (TID) | INTRAMUSCULAR | Status: DC
  Administered 2023-07-20: 4 [IU] via SUBCUTANEOUS
  Administered 2023-07-21 (×2): 7 [IU] via SUBCUTANEOUS

## 2023-07-20 MED ORDER — ALLOPURINOL 100 MG PO TABS
50.0000 mg | ORAL_TABLET | Freq: Once | ORAL | Status: AC
  Administered 2023-07-20: 50 mg via ORAL
  Filled 2023-07-20: qty 1

## 2023-07-20 MED ORDER — VANCOMYCIN HCL 1250 MG/250ML IV SOLN: 1250.0000 mg | INTRAVENOUS | Status: DC

## 2023-07-20 MED ORDER — COLCHICINE 0.6 MG PO TABS
0.6000 mg | ORAL_TABLET | Freq: Once | ORAL | Status: AC
  Administered 2023-07-20: 0.6 mg via ORAL
  Filled 2023-07-20: qty 1

## 2023-07-20 MED ORDER — OXYCODONE HCL 5 MG PO TABS
5.0000 mg | ORAL_TABLET | Freq: Once | ORAL | Status: AC
  Administered 2023-07-20: 5 mg via ORAL
  Filled 2023-07-20: qty 1

## 2023-07-20 MED ORDER — ACETAMINOPHEN 325 MG PO TABS
650.0000 mg | ORAL_TABLET | Freq: Four times a day (QID) | ORAL | Status: DC
  Administered 2023-07-20 – 2023-07-21 (×4): 650 mg via ORAL
  Filled 2023-07-20 (×4): qty 2

## 2023-07-20 MED ORDER — ALLOPURINOL 100 MG PO TABS
100.0000 mg | ORAL_TABLET | Freq: Every day | ORAL | Status: DC
  Administered 2023-07-21: 100 mg via ORAL
  Filled 2023-07-20: qty 1

## 2023-07-20 MED ORDER — INSULIN ASPART 100 UNIT/ML IJ SOLN
0.0000 [IU] | Freq: Every day | INTRAMUSCULAR | Status: DC
  Administered 2023-07-20: 3 [IU] via SUBCUTANEOUS

## 2023-07-20 MED ORDER — PREDNISONE 20 MG PO TABS
40.0000 mg | ORAL_TABLET | Freq: Every day | ORAL | Status: DC
  Administered 2023-07-20 – 2023-07-21 (×2): 40 mg via ORAL
  Filled 2023-07-20 (×2): qty 2

## 2023-07-20 NOTE — Progress Notes (Signed)
 Subjective: States left arm feels a little bit better but still hurts.  Feels like a gout attack.  Currently has a lidocaine  patch and heat pack on his wrist and states this is helpful.  Objective: Vital signs in last 24 hours: Temp:  [98.5 F (36.9 C)-101.4 F (38.6 C)] 99 F (37.2 C) (07/11 1211) Pulse Rate:  [87-100] 99 (07/11 1211) Resp:  [14-19] 18 (07/11 1211) BP: (105-174)/(48-107) 150/67 (07/11 1211) SpO2:  [87 %-99 %] 98 % (07/11 1211) Weight:  [145.6 kg] 145.6 kg (07/10 1755)  Intake/Output from previous day: 07/10 0701 - 07/11 0700 In: 1192.1 [I.V.:100; IV Piggyback:1092.1] Out: -  Intake/Output this shift: Total I/O In: 600 [P.O.:600] Out: 650 [Urine:650]  Recent Labs    07/19/23 1751 07/20/23 0500  HGB 12.7* 11.5*   Recent Labs    07/19/23 1751 07/20/23 0500  WBC 13.5* 11.4*  RBC 4.47 4.09*  HCT 40.4 37.3*  PLT 252 229   Recent Labs    07/19/23 1751 07/20/23 0500  NA 136 138  K 5.3* 4.1  CL 104 103  CO2 20* 21*  BUN 63* 62*  CREATININE 3.41* 3.41*  GLUCOSE 137* 97  CALCIUM  9.0 8.8*   Recent Labs    07/19/23 1905  INR 1.3*    Left arm: Swelling remains in digits and hand.  Still tender to palpation at PIP and MP joint of index finger and wrist.  Tenderness decreased at elbow.  Passive range of motion at elbow improved from yesterday.  He states it is less painful.  Still pain with motion of wrist.  No erythema or proximal streaking.  Compartments soft.  Assessment/Plan: Gouty attack left upper extremity.  Do not suspect septic arthritis at this point given 2 major joints and 2 finger joints involved.  Patient not clinically sick and is afebrile.  White blood count decreasing.  Spoke with medicine service and plan is to start low-dose prednisone  and hold colchicine  due to renal function which I think is reasonable.  Appreciate the assistance with this patient.   Scotti Motter 07/20/2023, 4:12 PM

## 2023-07-20 NOTE — Progress Notes (Signed)
 Patient ID: [name], male   DOB: [birth year], 48 y.o.   MRN: [id number]   LOS: 0 days   Subjective: Doing better but not out of the woods yet.   Objective: Vital signs in last 24 hours: Temp:  [98.5 F (36.9 C)-101.4 F (38.6 C)] 98.9 F (37.2 C) (07/11 0921) Pulse Rate:  [87-100] 98 (07/11 0921) Resp:  [14-19] 18 (07/11 0921) BP: (105-174)/(48-107) 166/107 (07/11 0921) SpO2:  [87 %-99 %] 99 % (07/11 0921) Weight:  [145.6 kg] 145.6 kg (07/10 1755)     Laboratory  CBC Recent Labs    07/19/23 1751 07/20/23 0500  WBC 13.5* 11.4*  HGB 12.7* 11.5*  HCT 40.4 37.3*  PLT 252 229   BMET Recent Labs    07/19/23 1751 07/20/23 0500  NA 136 138  K 5.3* 4.1  CL 104 103  CO2 20* 21*  GLUCOSE 137* 97  BUN 63* 62*  CREATININE 3.41* 3.41*  CALCIUM  9.0 8.8*     Physical Exam General appearance: alert and no distress LUE -- Diffuse mod TTP and edema proximal elbow to hand, no erythema.   Assessment/Plan: LUE pain -- Encouraged by progress overnight. Will continue to follow. No surgical indication at this time.    Ozell DOROTHA Ned, PA-C Orthopedic Surgery [phone] 07/20/2023

## 2023-07-20 NOTE — Progress Notes (Signed)
 HD#0 SUBJECTIVE:  Patient Summary: [name] is a 48 y.o. with a pertinent PMH of insulin  dependent DM, HFpEF, HTN, gout, and CKD Stage 4, who presented with left arm pain and admitted for an acute gout flare.   Overnight Events: No acute events overnight  Interim History: Pt seen and examined at the bedside this morning. He reports that his left arm is still in significant pain but that it feels microscopically better than last night.   OBJECTIVE:  Vital Signs: Vitals:   07/20/23 0320 07/20/23 0717 07/20/23 0921 07/20/23 1211  BP: (!) 119/57 (!) 174/76 (!) 166/107 (!) 150/67  Pulse: 92 95 98 99  Resp: 18 18 18 18   Temp: 98.5 F (36.9 C) 98.8 F (37.1 C) 98.9 F (37.2 C) 99 F (37.2 C)  TempSrc: Oral Oral    SpO2: 93% 99% 99% 98%  Weight:      Height:       Supplemental O2: Room Air SpO2: 98 %  Filed Weights   07/19/23 1755  Weight: (!) 145.6 kg     Intake/Output Summary (Last 24 hours) at 07/20/2023 1414 Last data filed at 07/20/2023 1233 Gross per 24 hour  Intake 1552.08 ml  Output 650 ml  Net 902.08 ml   Net IO Since Admission: 902.08 mL [07/20/23 1414]  Physical Exam: Const: Awake, alert in NAD HENT: Normocephalic, atraumatic, mucus membranes moist Card: RRR, No MRG, No pitting edema on LE's bilaterally  Resp: LCTAB, no increased work of breathing Abd: Soft, NTND Extremities: Left hand to elbow edematous and painful with movement and to palpation. Previous R AKA   Patient Lines/Drains/Airways Status     Active Line/Drains/Airways     Name Placement date Placement time Site Days   Peripheral IV 07/19/23 20 G Right Antecubital 07/19/23  1744  Antecubital  1            Pertinent labs and imaging:      Latest Ref Rng & Units 07/20/2023    5:00 AM 07/19/2023    5:51 PM 07/06/2023    9:23 AM  CBC  WBC 4.0 - 10.5 K/uL 11.4  13.5    Hemoglobin 13.0 - 17.0 g/dL 88.4  87.2  88.3   Hematocrit 39.0 - 52.0 % 37.3  40.4    Platelets 150 - 400 K/uL  229  252         Latest Ref Rng & Units 07/20/2023    5:00 AM 07/19/2023    5:51 PM 07/06/2023    9:19 AM  CMP  Glucose 70 - 99 mg/dL 97  862  68   BUN 6 - 20 mg/dL 62  63  60   Creatinine 0.61 - 1.24 mg/dL 6.58  6.58  6.98   Sodium 135 - 145 mmol/L 138  136  136   Potassium 3.5 - 5.1 mmol/L 4.1  5.3  4.7   Chloride 98 - 111 mmol/L 103  104  107   CO2 22 - 32 mmol/L 21  20  21    Calcium  8.9 - 10.3 mg/dL 8.8  9.0  9.0   Total Protein 6.5 - 8.1 g/dL  7.3    Total Bilirubin 0.0 - 1.2 mg/dL  1.1    Alkaline Phos 38 - 126 U/L  61    AST 15 - 41 U/L  19    ALT 0 - 44 U/L  11      CT FOREARM LEFT WO CONTRAST Result Date: 07/19/2023 CLINICAL DATA:  Deep space infection in the hand, forearm, and elbow EXAM: CT OF THE LEFT FOREARM WITHOUT CONTRAST TECHNIQUE: Multidetector CT imaging was performed according to the standard protocol. Multiplanar CT image reconstructions were also generated. RADIATION DOSE REDUCTION: This exam was performed according to the departmental dose-optimization program which includes automated exposure control, adjustment of the mA and/or kV according to patient size and/or use of iterative reconstruction technique. COMPARISON:  Left hand radiograph 07/19/2023. Left elbow and forearm radiographs 08/31/2022. FINDINGS: Bones/Joint/Cartilage Motion artifact. Bones appear intact. No evidence of acute fracture or dislocation. No focal bone lesion or bone destruction. No cortical changes to suggest osteomyelitis. Ligaments Suboptimally assessed by CT. Muscles and Tendons Streak artifact and motion artifact limits evaluation. No intramuscular mass or hematoma identified. Soft tissues No loculated collections. IMPRESSION: 1. Technically limited study due to motion artifact and streak artifact. 2. No acute bony abnormalities are seen. 3. No loculated collection to suggest abscess. Consider ultrasound or MRI for better evaluation of the forearm soft tissues if clinically indicated.  Electronically Signed   By: Elsie Gravely M.D.   On: 07/19/2023 19:30   DG Chest Port 1 View Result Date: 07/19/2023 CLINICAL DATA:  Infection. Arm swelling starting on Sunday and progressively worsening. Fever today. History of gout and cellulitis. EXAM: PORTABLE CHEST 1 VIEW COMPARISON:  03/13/2023 FINDINGS: Shallow inspiration. Heart size and pulmonary vascularity are normal for technique and unchanged. Lungs are clear. No pleural effusion or pneumothorax. Mediastinal contours appear intact. IMPRESSION: Shallow inspiration.  No evidence of active pulmonary disease. Electronically Signed   By: Elsie Gravely M.D.   On: 07/19/2023 19:07    ASSESSMENT/PLAN:  Assessment: Principal Problem:   Acute gout   Plan: #Acute Gout Flare Upon admission, concern for infection vs acute gout flare. Due to stable  with history of Gout and previous acute flares requiring hospitalization. Improved movement this morning with 1.2 colchicine  overnight. Will give 0.6 colchicine  this morning. Due to severity of flare, will initiate 5 day course of prednisone  40mg  with close BG management. Due to subtherapeutic dose of allopurinol  at home, will alter to 100mg  daily. Tylenol  650 scheduled for pain.    #Insulin  Dependent Diabetes Mellitus Questionable history of T2 vs T1 diabetes. Pt currently well managed on Tresiba  36u and Novolog  Flexpen, and mounjaro . Will continue semglee  and resistant scale SSI and close monitoring due to prednisone .   #CKD Stage 4 Pyuria Pt has long standing history of CKD and is followed by [facility]. BUN 62 and Cr 3.41 this morning. Currently at baseline. UA with leukocytosis, rare bacteria, and >50 WBCs. He does not report any urinary symptoms. Likely not acute UTI and will hold off treatment for now.   HFpEF HTN -Continue home Amlodipine  10mg , carvedilol  25 BID, hydralazine  100mg  TID, and clonidine  0.1mg  patch weekly. Will continue to monitor and adjust if necessary.  -Echo in  March of 2025 with EF 55-60%, LVH, and mitral regurgitation. Currently without exacerbation. Will continue home medications as above.  Best Practice: Diet: Renal diet IVF: Fluids: None, Rate: None VTE: Eliquis  5mg  BID Code: Full  Disposition planning: Therapy Recs: Pending, Family Contact: Darin Karma, to be notified. DISPO: Anticipated discharge pending to Home pending Clinical Improvement.  Signature:  Schuyler Novak, DO [facility]  2:14 PM, 07/20/2023  On Call pager [phone]

## 2023-07-20 NOTE — Evaluation (Signed)
 Occupational Therapy Evaluation Patient Details Name: [name] MRN: [id number] DOB: 16-Jan-1975 Today's Date: 07/20/2023   History of Present Illness   [name] is a 48 y.o. male who presented from IM clinic with worsening L hand pain. Admitted for gout flare. PMH of insulin  dependent DM, HFpEF, HTN, gout, and CKD Stage 4, R AKA     Clinical Impressions [name] was evaluated s/p the above admission list. He is mod I at [facility] level at baseline. He is active and enjoys working out. Upon evaluation the pt was limited by LUE pain with very limited tolerance of LUE movement. Overall he currently requires set up - mod A for Adls at bed level due to pain with LUE movement. Anticipate quick return to mod I baseline once LUE pain is managed. Discussed ROM and RICE protocol with pt, he verbalized great understanding and is eager to start exercising again once his pain allows. He declined OOB transfer on eval due to pain. Pt will benefit from continued acute OT services and no need for follow up OT.      If plan is discharge home, recommend the following:   Assist for transportation     Functional Status Assessment   Patient has had a recent decline in their functional status and demonstrates the ability to make significant improvements in function in a reasonable and predictable amount of time.     Equipment Recommendations   None recommended by OT      Precautions/Restrictions   Precautions Precautions: Fall Precaution/Restrictions Comments: chronic R AKA Restrictions Weight Bearing Restrictions Per Provider Order: No     Mobility Bed Mobility Overal bed mobility: Modified Independent             General bed mobility comments: pt moving slowly to guard LUE    Transfers                   General transfer comment: pt declined on evaluation but anticipate quick return to his mod I baseline once LUE pain resolves          ADL either performed or  assessed with clinical judgement   ADL Overall ADL's : Needs assistance/impaired Eating/Feeding: Set up   Grooming: Set up   Upper Body Bathing: Moderate assistance   Lower Body Bathing: Minimal assistance;Sitting/lateral leans   Upper Body Dressing : Minimal assistance;Sitting   Lower Body Dressing: Moderate assistance;Sitting/lateral leans       Toileting- Clothing Manipulation and Hygiene: Contact guard assist;Sitting/lateral lean       Functional mobility during ADLs: Minimal assistance General ADL Comments: limited by LUE pain and limited ROM tolerance. Anticipate once pain in contorlled he will be able to complete ADLs with more independence     Vision Baseline Vision/History: 0 No visual deficits Vision Assessment?: No apparent visual deficits     Perception Perception: Within Functional Limits       Praxis Praxis: WFL       Pertinent Vitals/Pain Pain Assessment Pain Assessment: 0-10 Pain Score: 10-Worst pain ever Pain Location: L hand with any movement Pain Descriptors / Indicators: Discomfort, Guarding Pain Intervention(s): Limited activity within patient's tolerance, Monitored during session     Extremity/Trunk Assessment Upper Extremity Assessment Upper Extremity Assessment: LUE deficits/detail LUE Deficits / Details: edematous distally, pt is unable to actively move any digits without 10/10 pain. Declined attempt at PROM. he can move his wrist minimally and elbow about 50% of ROM. he denies shoulder pain. LUE Sensation: WNL LUE Coordination: decreased  gross motor;decreased fine motor   Lower Extremity Assessment Lower Extremity Assessment: Overall WFL for tasks assessed   Cervical / Trunk Assessment Cervical / Trunk Assessment: Normal   Communication Communication Communication: No apparent difficulties   Cognition Arousal: Alert Behavior During Therapy: WFL for tasks assessed/performed Cognition: No apparent impairments             OT  - Cognition Comments: Good awareness of gout management and ROM, RICE protocol                 Following commands: Intact       Cueing  General Comments      VSS           Home Living Family/patient expects to be discharged to:: Private residence Living Arrangements: Alone Available Help at Discharge: Personal care attendant;Available PRN/intermittently Type of Home: Apartment Home Access: Level entry     Home Layout: One level     Bathroom Shower/Tub: Chief Strategy Officer: Standard Bathroom Accessibility: Yes   Home Equipment: Agricultural consultant (2 wheels);Wheelchair - manual;Tub bench   Additional Comments: PCA M-F 7-3, assists with cleaning, some cooking,      Prior Functioning/Environment Prior Level of Function : Independent/Modified Independent;Other (comment)             Mobility Comments: Uses a RW in the home (with/without prosthesis); uses a wheelchair for community access; keeps a RW at the gym to use ADLs Comments: Independent with ADLs; aide assists with cooking and cleaning - enjoys boxing for exercise    OT Problem List: Impaired UE functional use   OT Treatment/Interventions: Self-care/ADL training;Therapeutic exercise;Therapeutic activities;Modalities      OT Goals(Current goals can be found in the care plan section)   Acute Rehab OT Goals Patient Stated Goal: less pain OT Goal Formulation: With patient Time For Goal Achievement: 08/03/23 Potential to Achieve Goals: Good ADL Goals Additional ADL Goal #1: Pt will complete ADLs at his mod I baseline Additional ADL Goal #2: Pt will complete LUE ROM HEP for joint integrity and edema management   OT Frequency:  Min 3X/week       AM-PAC OT 6 Clicks Daily Activity     Outcome Measure Help from another person eating meals?: A Little Help from another person taking care of personal grooming?: A Little Help from another person toileting, which includes using toliet,  bedpan, or urinal?: A Little Help from another person bathing (including washing, rinsing, drying)?: A Lot Help from another person to put on and taking off regular upper body clothing?: A Little Help from another person to put on and taking off regular lower body clothing?: A Lot 6 Click Score: 16   End of Session Nurse Communication: Mobility status  Activity Tolerance: Patient limited by pain Patient left: in bed;with call bell/phone within reach  OT Visit Diagnosis: Pain                Time: 8771-8758 OT Time Calculation (min): 13 min Charges:  OT General Charges $OT Visit: 1 Visit OT Evaluation $OT Eval Low Complexity: 1 Low  Lucie Kendall, OTR/L Acute Rehabilitation Services Office [phone] Secure Chat Communication Preferred   Lucie JONETTA Kendall 07/20/2023, 1:43 PM

## 2023-07-20 NOTE — Progress Notes (Signed)
 Late entry - 7/10 911 was called to transport pt to [facility] ED.

## 2023-07-20 NOTE — Progress Notes (Signed)
 Pharmacy Antibiotic Note  [name] is a 48 y.o. male admitted on 07/19/2023 with possible LUE infection.  Pharmacy has been consulted for Vancomycin   dosing.  Plan: Vancomycin  1250 mg IV q24h  Height: 6' 2 (188 cm) Weight: (!) 145.6 kg (321 lb) IBW/kg (Calculated) : 82.2  Temp (24hrs), Avg:99.7 F (37.6 C), Min:98.5 F (36.9 C), Max:101.4 F (38.6 C)  Recent Labs  Lab 07/19/23 1751 07/19/23 1810  WBC 13.5*  --   CREATININE 3.41*  --   LATICACIDVEN  --  0.9    Estimated Creatinine Clearance: 41.2 mL/min (A) (by C-G formula based on SCr of 3.41 mg/dL (H)).    Allergies  Allergen Reactions   Reglan  [Metoclopramide ] Other (See Comments)    Dysphoric reaction   Chlorthalidone Hives   Metformin  And Related Diarrhea   Nsaids Other (See Comments)    Chronic kidney disease IV   Ozempic  (0.25 Or 0.5 Mg-Dose) [Semaglutide (0.25 Or 0.5mg -Dos)] Nausea And Vomiting   Thiazide-Type Diuretics Other (See Comments)    Gout    [name] 07/20/2023 12:35 AM

## 2023-07-20 NOTE — Progress Notes (Signed)
 PT Cancellation Note  Patient Details Name: [name] MRN: [id number] DOB: Nov 27, 1975   Cancelled Treatment:    Reason Eval/Treat Not Completed: PT screened, no needs identified, will sign off - per OT pt is independent and is at baseline, PT to sign off.  [name], PT DPT Acute Rehabilitation Services Secure Chat Preferred  Office [phone]    Quintavia Rogstad E Stroup 07/20/2023, 1:16 PM

## 2023-07-21 ENCOUNTER — Other Ambulatory Visit (HOSPITAL_COMMUNITY): Payer: Self-pay

## 2023-07-21 DIAGNOSIS — M109 Gout, unspecified: Secondary | ICD-10-CM | POA: Diagnosis not present

## 2023-07-21 LAB — CBC
HCT: 39.1 % (ref 39.0–52.0)
Hemoglobin: 12.3 g/dL — ABNORMAL LOW (ref 13.0–17.0)
MCH: 27.6 pg (ref 26.0–34.0)
MCHC: 31.5 g/dL (ref 30.0–36.0)
MCV: 87.9 fL (ref 80.0–100.0)
Platelets: 275 K/uL (ref 150–400)
RBC: 4.45 MIL/uL (ref 4.22–5.81)
RDW: 15.1 % (ref 11.5–15.5)
WBC: 10.3 K/uL (ref 4.0–10.5)
nRBC: 0 % (ref 0.0–0.2)

## 2023-07-21 LAB — RENAL FUNCTION PANEL
Albumin: 3 g/dL — ABNORMAL LOW (ref 3.5–5.0)
Anion gap: 8 (ref 5–15)
BUN: 70 mg/dL — ABNORMAL HIGH (ref 6–20)
CO2: 25 mmol/L (ref 22–32)
Calcium: 9.2 mg/dL (ref 8.9–10.3)
Chloride: 100 mmol/L (ref 98–111)
Creatinine, Ser: 3.57 mg/dL — ABNORMAL HIGH (ref 0.61–1.24)
GFR, Estimated: 20 mL/min — ABNORMAL LOW (ref >60)
Glucose, Bld: 206 mg/dL — ABNORMAL HIGH (ref 70–99)
Phosphorus: 5 mg/dL — ABNORMAL HIGH (ref 2.5–4.6)
Potassium: 4 mmol/L (ref 3.5–5.1)
Sodium: 133 mmol/L — ABNORMAL LOW (ref 135–145)

## 2023-07-21 LAB — GLUCOSE, CAPILLARY
Glucose-Capillary: 208 mg/dL — ABNORMAL HIGH (ref 70–99)
Glucose-Capillary: 213 mg/dL — ABNORMAL HIGH (ref 70–99)

## 2023-07-21 MED ORDER — LIDOCAINE 5 % EX PTCH
1.0000 | MEDICATED_PATCH | CUTANEOUS | 0 refills | Status: DC
  Filled 2023-07-21: qty 30, 30d supply, fill #0

## 2023-07-21 MED ORDER — PREDNISONE 10 MG PO TABS
ORAL_TABLET | ORAL | 0 refills | Status: DC
  Filled 2023-07-21: qty 34, 13d supply, fill #0

## 2023-07-21 MED ORDER — TRESIBA FLEXTOUCH 200 UNIT/ML ~~LOC~~ SOPN
40.0000 [IU] | PEN_INJECTOR | SUBCUTANEOUS | 1 refills | Status: DC
  Filled 2023-07-21: qty 6, 30d supply, fill #0

## 2023-07-21 MED ORDER — ALLOPURINOL 100 MG PO TABS
100.0000 mg | ORAL_TABLET | Freq: Every day | ORAL | 1 refills | Status: DC
  Filled 2023-07-21: qty 30, 30d supply, fill #0

## 2023-07-21 NOTE — Plan of Care (Signed)
  Problem: Education: Goal: Knowledge of General Education information will improve Description: Including pain rating scale, medication(s)/side effects and non-pharmacologic comfort measures Outcome: Progressing   Problem: Pain Managment: Goal: General experience of comfort will improve and/or be controlled Outcome: Progressing

## 2023-07-21 NOTE — Discharge Summary (Signed)
 Name: [name] MRN: [id number] DOB: [birth year] 48 y.o. PCP: Renne Homans, MD  Date of Admission: 07/19/2023  5:41 PM Date of Discharge: 07/21/2023 Attending Physician: Dr. Mliss Pouch  Discharge Diagnosis: 1. Principal Problem:   Acute gout   Discharge Medications: Allergies as of 07/21/2023       Reactions   Reglan  [metoclopramide ] Other (See Comments)   Dysphoric reaction   Chlorthalidone Hives   Metformin  And Related Diarrhea   Nsaids Other (See Comments)   Chronic kidney disease IV   Ozempic  (0.25 Or 0.5 Mg-dose) [semaglutide (0.25 Or 0.5mg -dos)] Nausea And Vomiting   Thiazide-type Diuretics Other (See Comments)   Gout        Medication List     PAUSE taking these medications    oxybutynin  5 MG tablet Wait to take this until your doctor or other care provider tells you to start again. Commonly known as: DITROPAN  Take 1 tablet (5 mg total) by mouth 3 (three) times daily.       STOP taking these medications    lidocaine  4 % Commonly known as: HM Lidocaine  Patch Replaced by: lidocaine  5 %   ofloxacin 0.3 % ophthalmic solution Commonly known as: OCUFLOX   oxycodone  5 MG capsule Commonly known as: OXY-IR       TAKE these medications    acetaminophen  325 MG tablet Commonly known as: TYLENOL  Take 2 tablets (650 mg total) by mouth every 6 (six) hours. What changed:  when to take this reasons to take this   allopurinol  100 MG tablet Commonly known as: ZYLOPRIM  Take 1 tablet (100 mg total) by mouth daily. Start taking on: July 22, 2023 What changed:  how much to take when to take this   amLODipine  10 MG tablet Commonly known as: NORVASC  Take 10 mg by mouth at bedtime.   apixaban  5 MG Tabs tablet Commonly known as: Eliquis  Take 1 tablet (5 mg total) by mouth 2 (two) times daily.   carvedilol  25 MG tablet Commonly known as: COREG  TAKE 1 TABLET BY MOUTH TWICE DAILY WITH A MEAL   cloNIDine  0.1 mg/24hr patch Commonly known as:  CATAPRES  - Dosed in mg/24 hr Place 0.1 mg onto the skin once a week. Thursdays   diclofenac  Sodium 1 % Gel Commonly known as: Voltaren  Apply 2 g topically 4 (four) times daily. What changed:  when to take this reasons to take this   fluticasone  50 MCG/ACT nasal spray Commonly known as: Flonase  Place 2 sprays into both nostrils daily. What changed:  when to take this reasons to take this   FreeStyle Libre 2 Reader Espiridion Use with sensors to monitor blood glucose   FreeStyle Libre 2 Reader Devi 1 each by Does not apply route daily.   FreeStyle Libre 2 Sensor Misc Change every 14 days as directed by manufacturer   furosemide  20 MG tablet Commonly known as: LASIX  Take 20 mg by mouth. What changed: Another medication with the same name was changed. Make sure you understand how and when to take each.   furosemide  80 MG tablet Commonly known as: LASIX  Take 1 tablet (80 mg total) by mouth 2 (two) times daily. What changed: when to take this   gabapentin  100 MG capsule Commonly known as: NEURONTIN  TAKE 2 CAPSULE BY MOUTH IN THE MORNING, 1 IN THE AFTERNOON AND 2 NIGHTLY What changed:  how much to take how to take this when to take this   hydrALAZINE  100 MG tablet Commonly known as: APRESOLINE  Take 100 mg  by mouth 3 (three) times daily.   Klor-Con  M20 20 MEQ tablet Generic drug: potassium chloride  SA Take 20 mEq by mouth 3 (three) times daily.   lidocaine  5 % Commonly known as: LIDODERM  Place 1 patch onto the skin daily. Remove & Discard patch within 12 hours or as directed by MD Replaces: lidocaine  4 %   lovastatin  20 MG tablet Commonly known as: MEVACOR  Take 1 tablet by mouth once daily   NovoLOG  FlexPen 100 UNIT/ML FlexPen Generic drug: insulin  aspart Inject 0-10 Units into the skin 3 (three) times daily with meals. Glucose 141-180: inject 4 units, Glucose 181-220: inject 6 units, Glucose 221-260: inject 8 units, Glucose 261-300: inject 10 units, If glucose if  greater than 300, please inject 12 units and call your PCP and/or endocrinologist office What changed:  when to take this reasons to take this   predniSONE  10 MG tablet Commonly known as: DELTASONE  Take 4 tablets per day until 7/15, then 3 tabs per day until 7/19, then 2 tabs per day until 7/22, then 1 tab per day until 7/25, then stop Start taking on: July 22, 2023   tamsulosin  0.4 MG Caps capsule Commonly known as: FLOMAX  Take 1 capsule by mouth at bedtime   TechLite Plus Pen Needles 32G X 4 MM Misc Generic drug: Insulin  Pen Needle Use as directed   tirzepatide  15 MG/0.5ML Pen Commonly known as: MOUNJARO  Inject 15 mg into the skin once a week.   Tresiba  FlexTouch 200 UNIT/ML FlexTouch Pen Generic drug: insulin  degludec Inject 40 Units into the skin daily. What changed: how much to take        Disposition and follow-up:   [name] was discharged from [facility] in Good condition.  At the hospital follow up visit please address:  1.Gout--ensure pt is taking prednisone  taper as directed and that mobility has increased in the wrist and fingers. Also ensure pt is taking allopurinol  2. Insulin  Dependent Diabetes--ensure insulin  regimen is adequate as steroids could cause hyperglycemia  2.  Labs / imaging needed at time of follow-up: Blood Glucose, Uric Acid, RFP  3.  Pending labs/ test needing follow-up: Blood cultures  Follow-up Appointments:    Hospital Course by problem list: [name] is a 48 y.o. person living with a history of insulin  dependent DM, HFpEF, HTN, gout, and CKD stage 4 who presented with swelling and erythema of the left hand and admitted for an acute gout flare now being discharged on hospital day 0 with the following pertinent hospital course:  Acute Gout Flare Pt presented to the ED on 7/10 from the [facility] clinic for concern of acute gout flare vs. Infection of the LUE. There was significant edema with decreased mobility of  the left wrist and fingers. He was started on Vanc and Zosyn  for concern of infection and SIRS criteria. CT showed no acute bony abnormalities or abscess. He was evaluated by orthopedics who were not concerned for an acute infection. This was deemed an acute gout flare and the patient was given colchicine  in the ED. Due to his renal function, he was started on a 5 days course of 40mg  prednisone  with a taper. On 7/12 the pt had significantly reduced swelling and increased mobility. He was deemed safe for discharge.     Stable chronic medical conditions: CKD stage 4 HFpEF  Subjective Pt sen and examined sitting up in his wheelchair today. He stated that he was feeling much better overall, but is concerned about taking  steroids with his diabetes. Educated the pt that prednisone  is the best option due to his current renal function. He agreed and is ready to be discharged.   Discharge Exam:   BP (!) 178/82 (BP Location: Right Arm)   Pulse 82   Temp 97.9 F (36.6 C) (Oral)   Resp 18   Ht 6' 2 (1.88 m)   Wt (!) 145.6 kg   SpO2 98%   BMI 41.21 kg/m  Discharge exam:  Const: Awake, alert in NAD HENT: Normocephalic, atraumatic, mucus membranes moist Card: RRR, No MRG, No pitting edema on LE's bilaterally  Resp: no increased work of breathing Extremities: Warm. LUE with decreased edema. Mobility significantly improved. Right AKA    Pertinent Labs, Studies, and Procedures:     Latest Ref Rng & Units 07/21/2023    6:57 AM 07/20/2023    5:00 AM 07/19/2023    5:51 PM  CBC  WBC 4.0 - 10.5 K/uL 10.3  11.4  13.5   Hemoglobin 13.0 - 17.0 g/dL 87.6  88.4  87.2   Hematocrit 39.0 - 52.0 % 39.1  37.3  40.4   Platelets 150 - 400 K/uL 275  229  252        Latest Ref Rng & Units 07/21/2023    6:57 AM 07/20/2023    5:00 AM 07/19/2023    5:51 PM  CMP  Glucose 70 - 99 mg/dL 793  97  862   BUN 6 - 20 mg/dL 70  62  63   Creatinine 0.61 - 1.24 mg/dL 6.42  6.58  6.58   Sodium 135 - 145 mmol/L 133  138   136   Potassium 3.5 - 5.1 mmol/L 4.0  4.1  5.3   Chloride 98 - 111 mmol/L 100  103  104   CO2 22 - 32 mmol/L 25  21  20    Calcium  8.9 - 10.3 mg/dL 9.2  8.8  9.0   Total Protein 6.5 - 8.1 g/dL   7.3   Total Bilirubin 0.0 - 1.2 mg/dL   1.1   Alkaline Phos 38 - 126 U/L   61   AST 15 - 41 U/L   19   ALT 0 - 44 U/L   11     CT FOREARM LEFT WO CONTRAST Result Date: 07/19/2023 CLINICAL DATA:  Deep space infection in the hand, forearm, and elbow EXAM: CT OF THE LEFT FOREARM WITHOUT CONTRAST TECHNIQUE: Multidetector CT imaging was performed according to the standard protocol. Multiplanar CT image reconstructions were also generated. RADIATION DOSE REDUCTION: This exam was performed according to the departmental dose-optimization program which includes automated exposure control, adjustment of the mA and/or kV according to patient size and/or use of iterative reconstruction technique. COMPARISON:  Left hand radiograph 07/19/2023. Left elbow and forearm radiographs 08/31/2022. FINDINGS: Bones/Joint/Cartilage Motion artifact. Bones appear intact. No evidence of acute fracture or dislocation. No focal bone lesion or bone destruction. No cortical changes to suggest osteomyelitis. Ligaments Suboptimally assessed by CT. Muscles and Tendons Streak artifact and motion artifact limits evaluation. No intramuscular mass or hematoma identified. Soft tissues No loculated collections. IMPRESSION: 1. Technically limited study due to motion artifact and streak artifact. 2. No acute bony abnormalities are seen. 3. No loculated collection to suggest abscess. Consider ultrasound or MRI for better evaluation of the forearm soft tissues if clinically indicated. Electronically Signed   By: Elsie Gravely M.D.   On: 07/19/2023 19:30   DG Chest Port 1 View Result Date: 07/19/2023  CLINICAL DATA:  Infection. Arm swelling starting on Sunday and progressively worsening. Fever today. History of gout and cellulitis. EXAM: PORTABLE CHEST  1 VIEW COMPARISON:  03/13/2023 FINDINGS: Shallow inspiration. Heart size and pulmonary vascularity are normal for technique and unchanged. Lungs are clear. No pleural effusion or pneumothorax. Mediastinal contours appear intact. IMPRESSION: Shallow inspiration.  No evidence of active pulmonary disease. Electronically Signed   By: Elsie Gravely M.D.   On: 07/19/2023 19:07     Discharge Instructions: Discharge Instructions     (HEART FAILURE PATIENTS) Call MD:  Anytime you have any of the following symptoms: 1) 3 pound weight gain in 24 hours or 5 pounds in 1 week 2) shortness of breath, with or without a dry hacking cough 3) swelling in the hands, feet or stomach 4) if you have to sleep on extra pillows at night in order to breathe.   Complete by: As directed    Call MD for:  redness, tenderness, or signs of infection (pain, swelling, redness, odor or green/yellow discharge around incision site)   Complete by: As directed    Call MD for:  severe uncontrolled pain   Complete by: As directed    Call MD for:  temperature >100.4   Complete by: As directed    Diet - low sodium heart healthy   Complete by: As directed    Discharge instructions   Complete by: As directed    Thank you for allowing us  to be part of your care. You were hospitalized for acute gout flare. We treated you with colchicine , prednisone , and allopurinol .  See the changes in your medications and management of your chronic conditions below:  *For your Gout -We have STARTED you on these following medications:  -Prednisone  with Taper:                    7/12-7/15: Take 4 tablets once with breakfast                    7/16-7/19: Take 3 tablets once with breakfast                    7/20-7/22: Take 2 tablets once with breakfast                    7/23-7/25: Take 1 tablet once with breakfast  -Allopurinol  100mg  once per day  -Please see the [facility] clinic in 7 to 10 days  FOLLOW UP APPOINTMENTS: Please call the [facility] clinic at  [phone] to schedule a follow up    Please make sure to take your prednisone  taper to completion  Please call your PCP or our clinic if you have any questions or concerns, we may be able to help and keep you from a long and expensive emergency room wait. Our clinic and after hours phone number is [phone]. The best time to call is Monday through Friday 9 am to 4 pm but there is always someone available 24/7 if you have an emergency. If you need medication refills please notify your pharmacy one week in advance and they will send us  a request.   We are glad you are feeling better,  Schuyler Novak, DO Internal Medicine Inpatient Teaching Service at [facility]   Increase activity slowly   Complete by: As directed        Signed: Novak Schuyler, DO 07/21/2023, 11:17 AM

## 2023-07-21 NOTE — Progress Notes (Signed)
 OT Cancellation Note  Patient Details Name: [name] MRN: [id number] DOB: 10-Mar-1975   Cancelled Treatment:    Reason Eval/Treat Not Completed: Other (comment) (new imminent order acknowedged. No acute change. POC remains appropriate.)  Lucie JONETTA Kendall 07/21/2023, 9:12 AM

## 2023-07-21 NOTE — Discharge Instructions (Addendum)
 Thank you for allowing us  to be part of your care. You were hospitalized for acute gout flare. We treated you with colchicine , prednisone , and allopurinol .  See the changes in your medications and management of your chronic conditions below:  *For your Gout -We have STARTED you on these following medications:  -Prednisone  with Taper:                    7/12-7/15: Take 4 tablets once with breakfast                    7/16-7/19: Take 3 tablets once with breakfast                    7/20-7/22: Take 2 tablets once with breakfast                    7/23-7/25: Take 1 tablet once with breakfast  -Allopurinol  100mg  once per day  -Please see the [facility] clinic in 7 to 10 days  FOLLOW UP APPOINTMENTS: Please call the [facility] clinic at [phone] to schedule a follow up    Please make sure to take your prednisone  taper to completion  Please call your PCP or our clinic if you have any questions or concerns, we may be able to help and keep you from a long and expensive emergency room wait. Our clinic and after hours phone number is [phone]. The best time to call is Monday through Friday 9 am to 4 pm but there is always someone available 24/7 if you have an emergency. If you need medication refills please notify your pharmacy one week in advance and they will send us  a request.   We are glad you are feeling better,  Victor Novak, DO Internal Medicine Inpatient Teaching Service at [facility]

## 2023-07-21 NOTE — Progress Notes (Signed)
 Orthopedic Tech Progress Note Patient Details:  [name] 11-21-1975 [id number]  Ortho Devices Type of Ortho Device: Velcro wrist splint Ortho Device/Splint Location: LUE Ortho Device/Splint Interventions: Application   Post Interventions Patient Tolerated: Well Instructions Provided: Adjustment of device  Rossetta Kama E Latrise Bowland 07/21/2023, 9:46 AM

## 2023-07-21 NOTE — Progress Notes (Signed)
 Discharge summary packet(AVS)& excused letter provided to pt with instructions.Pt verbalized understanding of instructions. NO complaints. Pt d/c to home as ordered. TOC meds provided.Per pt he's goin to eat his lunch first, then will head down to d/c lounge right after.He remains alert/oriented in no apparent distress.

## 2023-07-23 ENCOUNTER — Telehealth: Payer: Self-pay

## 2023-07-23 ENCOUNTER — Other Ambulatory Visit (HOSPITAL_COMMUNITY): Payer: Self-pay

## 2023-07-23 NOTE — Transitions of Care (Post Inpatient/ED Visit) (Signed)
   07/23/2023  Name: [name] MRN: [id number] DOB: 04-Jan-1976  Today's TOC FU Call Status: Today's TOC FU Call Status:: Successful TOC FU Call Completed TOC FU Call Complete Date: 07/23/23 Patient's Name and Date of Birth confirmed.  Transition Care Management Follow-up Telephone Call Date of Discharge: 07/21/23 Discharge Facility: [facility]) Type of Discharge: Inpatient Admission Primary Inpatient Discharge Diagnosis:: knee pain How have you been since you were released from the hospital?: Better Any questions or concerns?: No  Items Reviewed: Did you receive and understand the discharge instructions provided?: Yes Medications obtained,verified, and reconciled?: Yes (Medications Reviewed) Any new allergies since your discharge?: No Dietary orders reviewed?: Yes Do you have support at home?: Yes  Medications Reviewed Today: Medications Reviewed Today   Medications were not reviewed in this encounter     Home Care and Equipment/Supplies: Were Home Health Services Ordered?: NA Any new equipment or medical supplies ordered?: NA  Functional Questionnaire: Do you need assistance with bathing/showering or dressing?: No Do you need assistance with meal preparation?: No Do you need assistance with eating?: No Do you have difficulty maintaining continence: No Do you need assistance with getting out of bed/getting out of a chair/moving?: No Do you have difficulty managing or taking your medications?: No  Follow up appointments reviewed: PCP Follow-up appointment confirmed?: Yes Date of PCP follow-up appointment?: 07/26/23 Follow-up Provider: [facility] Follow-up appointment confirmed?: NA Do you need transportation to your follow-up appointment?: No Do you understand care options if your condition(s) worsen?: Yes-patient verbalized understanding    SIGNATURE Julian Lemmings, LPN [facility] Nurse Health Advisor Direct Dial [phone]

## 2023-07-24 ENCOUNTER — Ambulatory Visit: Payer: Self-pay | Admitting: Student

## 2023-07-24 LAB — CULTURE, BLOOD (ROUTINE X 2)
Culture: NO GROWTH
Culture: NO GROWTH
Special Requests: ADEQUATE
Special Requests: ADEQUATE

## 2023-07-26 ENCOUNTER — Other Ambulatory Visit: Payer: Self-pay

## 2023-07-26 ENCOUNTER — Ambulatory Visit

## 2023-07-26 VITALS — BP 169/90 | HR 89 | Temp 98.0°F | Ht 74.0 in

## 2023-07-26 DIAGNOSIS — I10 Essential (primary) hypertension: Secondary | ICD-10-CM

## 2023-07-26 DIAGNOSIS — M109 Gout, unspecified: Secondary | ICD-10-CM | POA: Diagnosis not present

## 2023-07-26 DIAGNOSIS — E119 Type 2 diabetes mellitus without complications: Secondary | ICD-10-CM

## 2023-07-26 DIAGNOSIS — Z794 Long term (current) use of insulin: Secondary | ICD-10-CM | POA: Diagnosis not present

## 2023-07-26 NOTE — Assessment & Plan Note (Signed)
 BP Readings from Last 3 Encounters:  07/26/23 (!) 169/90  07/21/23 (!) 178/82  07/19/23 (!) 143/74  Patient reports no shortness of breath, chest pain, headache, vision changes. Patient Continue current blood pressure medication and check BP at home. Follow-up in 1 month.

## 2023-07-26 NOTE — Patient Instructions (Addendum)
  Thank you, Mr. Wilver Tignor, for allowing us  to be part of your care today. We reviewed your hospital course and examined your left hand--swelling and redness have significantly improved, with no signs of infection. Please continue taking prednisone  as prescribed until July 27th, and continue allopurinol  100 mg daily.  Your CGM shows an average blood sugar of 163 over the past two weeks. A referral has been sent to the dietitian to support you with dietary management.  We look forward to seeing you again in 4 weeks to follow up on your blood pressure and hand. It was a pleasure seeing you today, please don't hesitate to reach out with any questions.   I have ordered the following labs for you:  Lab Orders  No laboratory test(s) ordered today     Tests ordered today:    Referrals ordered today:   Referral Orders         Amb ref to Medical Nutrition Therapy-MNT       I have ordered the following medication/changed the following medications:   Stop the following medications: There are no discontinued medications.   Start the following medications: No orders of the defined types were placed in this encounter.    Follow up:   1 Month    Should you have any questions or concerns please call the internal medicine clinic at [phone].    Armando Rossetti, M.D [facility]

## 2023-07-26 NOTE — Progress Notes (Signed)
 CC: post hospital F/U for acute Gout flare up  HPI:  [name] is a 48 y.o. male living with a history stated below and presents today for post hospital F/U for acute Gout flare up. Please see problem based assessment and plan for additional details.  Past Medical History:  Diagnosis Date   Acute venous embolism and thrombosis of deep vessels of proximal lower extremity (HCC) 07/19/2011   Anemia    Anginal pain (HCC)    pt denies   Anxiety    Chest pain, neg MI, normal coronaries by cath 02/18/2013   pt denies   CHF (congestive heart failure) (HCC)    Chronic renal disease, stage IV (HCC) 02/19/2013   Colon polyps    Depression    Diabetic ulcer of right foot (HCC)    DVT (deep venous thrombosis) (HCC) 09/2002   patient reports additional DVTs in '06 & '11 (unconfirmed)   ED (erectile dysfunction)    GERD (gastroesophageal reflux disease)    History of blood transfusion    related to OR (10/31/2016)   Hyperlipidemia 02/19/2013   Hypertension    Nausea & vomiting    constant for the last couple weeks (10/31/2016)   Nephrotic syndrome    Obesity    BMI 44, weight 346 pounds 01/30/14   OSA (obstructive sleep apnea) 02/28/2018   Mild obstructive sleep apnea with an AHI of 9.8/h but severe during REM sleep with an AHI of 33.8/h.  Oxygen  saturations dropped to 86% and there was moderate snoring   Peripheral edema    Pneumonia    Prosthesis adjustment 08/17/2016   Pulmonary embolism (HCC) 09/2002   treated with 6 months of warfarin   Pyelonephritis 02/02/2018   Type I diabetes mellitus (HCC) dx'd 2001   Urinary retention     Current Outpatient Medications on File Prior to Visit  Medication Sig Dispense Refill   acetaminophen  (TYLENOL ) 325 MG tablet Take 2 tablets (650 mg total) by mouth every 6 (six) hours. (Patient taking differently: Take 650 mg by mouth every 6 (six) hours as needed for mild pain (pain score 1-3).) 30 tablet 0   allopurinol  (ZYLOPRIM ) 100 MG  tablet Take 1 tablet (100 mg total) by mouth daily. 30 tablet 1   amLODipine  (NORVASC ) 10 MG tablet Take 10 mg by mouth at bedtime.     apixaban  (ELIQUIS ) 5 MG TABS tablet Take 1 tablet (5 mg total) by mouth 2 (two) times daily. 180 tablet 0   carvedilol  (COREG ) 25 MG tablet TAKE 1 TABLET BY MOUTH TWICE DAILY WITH A MEAL 180 tablet 0   cloNIDine  (CATAPRES  - DOSED IN MG/24 HR) 0.1 mg/24hr patch Place 0.1 mg onto the skin once a week. Thursdays     Continuous Glucose Receiver (FREESTYLE LIBRE 2 READER) DEVI Use with sensors to monitor blood glucose 1 each 0   Continuous Glucose Receiver (FREESTYLE LIBRE 2 READER) DEVI 1 each by Does not apply route daily. 1 each 0   Continuous Glucose Sensor (FREESTYLE LIBRE 2 SENSOR) MISC Change every 14 days as directed by manufacturer 6 each 3   diclofenac  Sodium (VOLTAREN ) 1 % GEL Apply 2 g topically 4 (four) times daily. (Patient taking differently: Apply 2 g topically daily as needed (for pain).) 100 g 3   fluticasone  (FLONASE ) 50 MCG/ACT nasal spray Place 2 sprays into both nostrils daily. (Patient taking differently: Place 2 sprays into both nostrils daily as needed for allergies.) 15.8 mL 2   furosemide  (LASIX ) 20  MG tablet Take 20 mg by mouth.     furosemide  (LASIX ) 80 MG tablet Take 1 tablet (80 mg total) by mouth 2 (two) times daily. (Patient taking differently: Take 80 mg by mouth daily.) 30 tablet 1   gabapentin  (NEURONTIN ) 100 MG capsule TAKE 2 CAPSULE BY MOUTH IN THE MORNING, 1 IN THE AFTERNOON AND 2 NIGHTLY (Patient taking differently: Take 100-200 mg by mouth See admin instructions. TAKE 2 CAPSULE BY MOUTH IN THE MORNING, 1 IN THE AFTERNOON AND 2 NIGHTLY) 300 capsule 2   hydrALAZINE  (APRESOLINE ) 100 MG tablet Take 100 mg by mouth 3 (three) times daily.     insulin  degludec (TRESIBA  FLEXTOUCH) 200 UNIT/ML FlexTouch Pen Inject 40 Units into the skin daily. 6 mL 1   Insulin  Pen Needle 32G X 4 MM MISC Use as directed 100 each 0   KLOR-CON  M20 20 MEQ  tablet Take 20 mEq by mouth 3 (three) times daily.     lidocaine  (LIDODERM ) 5 % Place 1 patch onto the skin daily. Remove & Discard patch within 12 hours or as directed by MD 30 patch 0   lovastatin  (MEVACOR ) 20 MG tablet Take 1 tablet by mouth once daily 90 tablet 0   NOVOLOG  FLEXPEN 100 UNIT/ML FlexPen Inject 0-10 Units into the skin 3 (three) times daily with meals. Glucose 141-180: inject 4 units, Glucose 181-220: inject 6 units, Glucose 221-260: inject 8 units, Glucose 261-300: inject 10 units, If glucose if greater than 300, please inject 12 units and call your PCP and/or endocrinologist office (Patient taking differently: Inject 0-10 Units into the skin 3 (three) times daily as needed for high blood sugar. Glucose 141-180: inject 4 units, Glucose 181-220: inject 6 units, Glucose 221-260: inject 8 units, Glucose 261-300: inject 10 units, If glucose if greater than 300, please inject 12 units and call your PCP and/or endocrinologist office) 30 mL 4   [Paused] oxybutynin  (DITROPAN ) 5 MG tablet Take 1 tablet (5 mg total) by mouth 3 (three) times daily. (Patient not taking: Reported on 07/23/2023) 90 tablet 1   predniSONE  (DELTASONE ) 10 MG tablet Take 4 tablets per day until 7/15, then 3 tabs per day until 7/19, then 2 tabs per day until 7/22, then 1 tab per day until 7/25, then stop 34 tablet 0   tamsulosin  (FLOMAX ) 0.4 MG CAPS capsule Take 1 capsule by mouth at bedtime 90 capsule 0   tirzepatide  (MOUNJARO ) 15 MG/0.5ML Pen Inject 15 mg into the skin once a week. 6 mL 4   No current facility-administered medications on file prior to visit.    Family History  Problem Relation Age of Onset   Heart disease Mother    Diabetes Mother    Diabetes Maternal Uncle    Diabetes Cousin     Social History   Socioeconomic History   Marital status: Single    Spouse name: Not on file   Number of children: 0   Years of education: 15.5   Highest education level: Some college, no degree  Occupational  History   Occupation: Consulting civil engineer    Comment: [employer]   Occupation: disability  Tobacco Use   Smoking status: Never   Smokeless tobacco: Never  Vaping Use   Vaping status: Never Used  Substance and Sexual Activity   Alcohol use: Yes    Comment: 10/31/2016 a few beers q 6 months or so   Drug use: No   Sexual activity: Yes  Other Topics Concern   Not on file  Social  History Narrative   Financial assistance approved for 100% discount at [facility] and has [facility] card per [employer]   09/01/2009.      Lives in [place] with mother and sister.            Social Drivers of Health   Financial Resource Strain: Medium Risk (01/31/2023)   Overall Financial Resource Strain (CARDIA)    Difficulty of Paying Living Expenses: Somewhat hard  Food Insecurity: No Food Insecurity (07/20/2023)   Hunger Vital Sign    Worried About Running Out of Food in the Last Year: Never true    Ran Out of Food in the Last Year: Never true  Transportation Needs: No Transportation Needs (07/20/2023)   PRAPARE - Administrator, Civil Service (Medical): No    Lack of Transportation (Non-Medical): No  Physical Activity: Sufficiently Active (01/31/2023)   Exercise Vital Sign    Days of Exercise per Week: 6 days    Minutes of Exercise per Session: 120 min  Stress: No Stress Concern Present (01/31/2023)   [employer] of Occupational Health - Occupational Stress Questionnaire    Feeling of Stress : Not at all  Social Connections: Socially Isolated (07/20/2023)   Social Connection and Isolation Panel    Frequency of Communication with Friends and Family: More than three times a week    Frequency of Social Gatherings with Friends and Family: More than three times a week    Attends Religious Services: Never    Database administrator or Organizations: No    Attends Banker Meetings: Never    Marital Status: Never married  Intimate Partner Violence: Not At Risk (07/20/2023)   Humiliation, Afraid,  Rape, and Kick questionnaire    Fear of Current or Ex-Partner: No    Emotionally Abused: No    Physically Abused: No    Sexually Abused: No    Review of Systems: ROS negative except for what is noted on the assessment and plan.  Vitals:   07/26/23 1016 07/26/23 1017 07/26/23 1040  BP: (!) 152/73 (!) 147/81 (!) 169/90  Pulse: 87 88 89  Temp: 98 F (36.7 C)    TempSrc: Oral    SpO2: 93%    Height: 6' 2 (1.88 m)      Physical Exam  Physical Exam: Constitutional: well-appearing in no acute distress Cardiovascular: regular rate and rhythm, no m/r/g Pulmonary/Chest: normal work of breathing on room air, lungs clear to auscultation bilaterally  Neurological: alert & oriented x 3  Assessment & Plan:   Acute gout Left hand gout flare up:  The patient presented to the clinic for a close post-hospital follow-up after admission for an acute gout flare, initially suspected to be cellulitis. Cellulitis was ruled out during hospitalization. He was diagnosed with a gout flare and started on prednisone . He was discharged in stable condition with a prednisone  taper and is currently taking 3 tablet daily.The swelling in the index finger has improved, and range of motion is generally good, with only mild limitation remaining in index finger.  He will continue prednisone  through July 27. He is also taking allopurinol  100 mg daily, which was added for long-term gout management.  A referral has been made to a dietitian to assist with dietary modifications for both gout and diabetes. He will follow up in 1 month for reassessment.  Hypertension BP Readings from Last 3 Encounters:  07/26/23 (!) 169/90  07/21/23 (!) 178/82  07/19/23 (!) 143/74  Patient reports no  shortness of breath, chest pain, headache, vision changes. Patient Continue current blood pressure medication and check BP at home. Follow-up in 1 month.   Patient seen with Dr. Onesimo Armando Rossetti M.D [facility] Internal  Medicine, PGY-1 Phone: [phone] Date 07/26/2023 Time 11:13 AM

## 2023-07-26 NOTE — Assessment & Plan Note (Addendum)
 Left hand gout flare up:  The patient presented to the clinic for a close post-hospital follow-up after admission for an acute gout flare, initially suspected to be cellulitis. Cellulitis was ruled out during hospitalization. He was diagnosed with a gout flare and started on prednisone . He was discharged in stable condition with a prednisone  taper and is currently taking 3 tablet daily.The swelling in the index finger has improved, and range of motion is generally good, with only mild limitation remaining in index finger.  He will continue prednisone  through July 27. He is also taking allopurinol  100 mg daily, which was added for long-term gout management.  A referral has been made to a dietitian to assist with dietary modifications for both gout and diabetes. He will follow up in 1 month for reassessment.

## 2023-07-29 NOTE — Progress Notes (Signed)
Internal Medicine Clinic Attending  I was physically present during the key portions of the resident provided service and participated in the medical decision making of patient's management care. I reviewed pertinent patient test results.  The assessment, diagnosis, and plan were formulated together and I agree with the documentation in the resident's note.  Narendra, Nischal, MD  

## 2023-08-02 DIAGNOSIS — D631 Anemia in chronic kidney disease: Secondary | ICD-10-CM | POA: Diagnosis not present

## 2023-08-02 DIAGNOSIS — N2581 Secondary hyperparathyroidism of renal origin: Secondary | ICD-10-CM | POA: Diagnosis not present

## 2023-08-02 DIAGNOSIS — N184 Chronic kidney disease, stage 4 (severe): Secondary | ICD-10-CM | POA: Diagnosis not present

## 2023-08-02 DIAGNOSIS — Z794 Long term (current) use of insulin: Secondary | ICD-10-CM | POA: Diagnosis not present

## 2023-08-02 DIAGNOSIS — I130 Hypertensive heart and chronic kidney disease with heart failure and stage 1 through stage 4 chronic kidney disease, or unspecified chronic kidney disease: Secondary | ICD-10-CM | POA: Diagnosis not present

## 2023-08-02 DIAGNOSIS — I5030 Unspecified diastolic (congestive) heart failure: Secondary | ICD-10-CM | POA: Diagnosis not present

## 2023-08-02 DIAGNOSIS — E1122 Type 2 diabetes mellitus with diabetic chronic kidney disease: Secondary | ICD-10-CM | POA: Diagnosis not present

## 2023-08-03 ENCOUNTER — Inpatient Hospital Stay (HOSPITAL_COMMUNITY): Admission: RE | Admit: 2023-08-03 | Source: Ambulatory Visit

## 2023-08-06 ENCOUNTER — Encounter (HOSPITAL_COMMUNITY)
Admission: RE | Admit: 2023-08-06 | Discharge: 2023-08-06 | Disposition: A | Discharge status: Home / Self Care | Source: Ambulatory Visit | Attending: Nephrology | Admitting: Nephrology

## 2023-08-06 VITALS — BP 137/71 | HR 93 | Temp 97.4°F | Resp 17

## 2023-08-06 DIAGNOSIS — N184 Chronic kidney disease, stage 4 (severe): Secondary | ICD-10-CM | POA: Diagnosis not present

## 2023-08-06 LAB — RENAL FUNCTION PANEL
Albumin: 3 g/dL — ABNORMAL LOW (ref 3.5–5.0)
Anion gap: 9 (ref 5–15)
BUN: 63 mg/dL — ABNORMAL HIGH (ref 6–20)
CO2: 20 mmol/L — ABNORMAL LOW (ref 22–32)
Calcium: 8.8 mg/dL — ABNORMAL LOW (ref 8.9–10.3)
Chloride: 109 mmol/L (ref 98–111)
Creatinine, Ser: 2.95 mg/dL — ABNORMAL HIGH (ref 0.61–1.24)
GFR, Estimated: 26 mL/min — ABNORMAL LOW (ref >60)
Glucose, Bld: 118 mg/dL — ABNORMAL HIGH (ref 70–99)
Phosphorus: 4.5 mg/dL (ref 2.5–4.6)
Potassium: 4.4 mmol/L (ref 3.5–5.1)
Sodium: 138 mmol/L (ref 135–145)

## 2023-08-06 LAB — FERRITIN: Ferritin: 412 ng/mL — ABNORMAL HIGH (ref 24–336)

## 2023-08-06 LAB — IRON AND TIBC
Iron: 38 ug/dL — ABNORMAL LOW (ref 45–182)
Saturation Ratios: 16 % — ABNORMAL LOW (ref 17.9–39.5)
TIBC: 242 ug/dL — ABNORMAL LOW (ref 250–450)
UIBC: 204 ug/dL

## 2023-08-06 MED ORDER — DARBEPOETIN ALFA 200 MCG/0.4ML IJ SOSY
PREFILLED_SYRINGE | INTRAMUSCULAR | Status: AC
  Filled 2023-08-06: qty 0.4

## 2023-08-06 MED ORDER — DARBEPOETIN ALFA 200 MCG/0.4ML IJ SOSY
200.0000 ug | PREFILLED_SYRINGE | INTRAMUSCULAR | Status: DC
  Administered 2023-08-06: 200 ug via SUBCUTANEOUS

## 2023-08-07 LAB — POCT HEMOGLOBIN-HEMACUE: Hemoglobin: 9.3 g/dL — ABNORMAL LOW (ref 13.0–17.0)

## 2023-08-10 NOTE — Progress Notes (Signed)
Internal Medicine Clinic Attending  I was physically present during the key portions of the resident provided service and participated in the medical decision making of patient's management care. I reviewed pertinent patient test results.  The assessment, diagnosis, and plan were formulated together and I agree with the documentation in the resident's note.  Gust Rung, DO

## 2023-08-10 NOTE — Progress Notes (Signed)
 Internal Medicine Clinic Attending  Case discussed with the resident at the time of the visit.  We reviewed the resident's history and exam and pertinent patient test results.  I agree with the assessment, diagnosis, and plan of care documented in the resident's note.

## 2023-08-16 ENCOUNTER — Other Ambulatory Visit: Payer: Self-pay | Admitting: Student

## 2023-08-16 DIAGNOSIS — E785 Hyperlipidemia, unspecified: Secondary | ICD-10-CM

## 2023-08-16 NOTE — Telephone Encounter (Signed)
 Copied from CRM #[id number]. Topic: Clinical - Medication Refill >> Aug 16, 2023  3:00 PM Cherylann S wrote: Medication: lovastatin  (MEVACOR ) 20 MG table  Has the patient contacted their pharmacy? Yes (Agent: If no, request that the patient contact the pharmacy for the refill. If patient does not wish to contact the pharmacy document the reason why and proceed with request.) (Agent: If yes, when and what did the pharmacy advise?) No response from provider's office   This is the patient's preferred pharmacy:  [id number] - [place] - [address] [place] ([place]) [place] [address] Phone: [phone] Fax: [phone]  Is this the correct pharmacy for this prescription? Yes If no, delete pharmacy and type the correct one.   Has the prescription been filled recently? No  Is the patient out of the medication? Yes  Has the patient been seen for an appointment in the last year OR does the patient have an upcoming appointment? Yes  Can we respond through MyChart? Yes  Agent: Please be advised that Rx refills may take up to 3 business days. We ask that you follow-up with your pharmacy.

## 2023-08-17 ENCOUNTER — Encounter (HOSPITAL_COMMUNITY)

## 2023-08-17 MED ORDER — LOVASTATIN 20 MG PO TABS: 20.0000 mg | ORAL_TABLET | Freq: Every day | ORAL | 0 refills | Status: DC

## 2023-08-20 ENCOUNTER — Encounter (HOSPITAL_COMMUNITY)

## 2023-08-20 DIAGNOSIS — E113212 Type 2 diabetes mellitus with mild nonproliferative diabetic retinopathy with macular edema, left eye: Secondary | ICD-10-CM | POA: Diagnosis not present

## 2023-08-22 ENCOUNTER — Other Ambulatory Visit: Payer: Self-pay | Admitting: Student

## 2023-08-22 DIAGNOSIS — E119 Type 2 diabetes mellitus without complications: Secondary | ICD-10-CM

## 2023-08-23 ENCOUNTER — Other Ambulatory Visit: Payer: Self-pay | Admitting: *Deleted

## 2023-08-24 ENCOUNTER — Encounter (HOSPITAL_COMMUNITY)
Admission: RE | Admit: 2023-08-24 | Discharge: 2023-08-24 | Disposition: A | Discharge status: Home / Self Care | Source: Ambulatory Visit | Attending: Nephrology | Admitting: Nephrology

## 2023-08-24 VITALS — BP 121/67 | HR 92 | Temp 97.3°F | Resp 16

## 2023-08-24 DIAGNOSIS — N184 Chronic kidney disease, stage 4 (severe): Secondary | ICD-10-CM | POA: Diagnosis not present

## 2023-08-24 DIAGNOSIS — D631 Anemia in chronic kidney disease: Secondary | ICD-10-CM | POA: Diagnosis not present

## 2023-08-24 LAB — IRON AND TIBC
Iron: 49 ug/dL (ref 45–182)
Saturation Ratios: 18 % (ref 17.9–39.5)
TIBC: 277 ug/dL (ref 250–450)
UIBC: 228 ug/dL

## 2023-08-24 LAB — RENAL FUNCTION PANEL
Albumin: 3.2 g/dL — ABNORMAL LOW (ref 3.5–5.0)
Anion gap: 7 (ref 5–15)
BUN: 52 mg/dL — ABNORMAL HIGH (ref 6–20)
CO2: 22 mmol/L (ref 22–32)
Calcium: 8.5 mg/dL — ABNORMAL LOW (ref 8.9–10.3)
Chloride: 107 mmol/L (ref 98–111)
Creatinine, Ser: 2.92 mg/dL — ABNORMAL HIGH (ref 0.61–1.24)
GFR, Estimated: 26 mL/min — ABNORMAL LOW (ref >60)
Glucose, Bld: 86 mg/dL (ref 70–99)
Phosphorus: 4 mg/dL (ref 2.5–4.6)
Potassium: 4.2 mmol/L (ref 3.5–5.1)
Sodium: 136 mmol/L (ref 135–145)

## 2023-08-24 LAB — FERRITIN: Ferritin: 275 ng/mL (ref 24–336)

## 2023-08-24 LAB — POCT HEMOGLOBIN-HEMACUE: Hemoglobin: 9.8 g/dL — ABNORMAL LOW (ref 13.0–17.0)

## 2023-08-24 MED ORDER — DARBEPOETIN ALFA 200 MCG/0.4ML IJ SOSY
PREFILLED_SYRINGE | INTRAMUSCULAR | Status: AC
Start: 2023-08-24 — End: 2023-08-24
  Filled 2023-08-24: qty 0.4

## 2023-08-24 MED ORDER — DARBEPOETIN ALFA 200 MCG/0.4ML IJ SOSY
200.0000 ug | PREFILLED_SYRINGE | INTRAMUSCULAR | Status: DC
  Administered 2023-08-24: 200 ug via SUBCUTANEOUS

## 2023-08-27 ENCOUNTER — Other Ambulatory Visit: Payer: Self-pay

## 2023-08-27 ENCOUNTER — Ambulatory Visit

## 2023-08-27 VITALS — BP 130/71 | HR 88 | Temp 98.5°F | Resp 24 | Ht 74.0 in

## 2023-08-27 DIAGNOSIS — Z794 Long term (current) use of insulin: Secondary | ICD-10-CM

## 2023-08-27 DIAGNOSIS — M1A09X0 Idiopathic chronic gout, multiple sites, without tophus (tophi): Secondary | ICD-10-CM

## 2023-08-27 DIAGNOSIS — I10 Essential (primary) hypertension: Secondary | ICD-10-CM | POA: Diagnosis not present

## 2023-08-27 DIAGNOSIS — E119 Type 2 diabetes mellitus without complications: Secondary | ICD-10-CM | POA: Diagnosis not present

## 2023-08-27 DIAGNOSIS — R3914 Feeling of incomplete bladder emptying: Secondary | ICD-10-CM | POA: Diagnosis not present

## 2023-08-27 DIAGNOSIS — N400 Enlarged prostate without lower urinary tract symptoms: Secondary | ICD-10-CM | POA: Insufficient documentation

## 2023-08-27 DIAGNOSIS — N401 Enlarged prostate with lower urinary tract symptoms: Secondary | ICD-10-CM | POA: Diagnosis not present

## 2023-08-27 LAB — POCT GLYCOSYLATED HEMOGLOBIN (HGB A1C): Hemoglobin A1C: 5.4 % (ref 4.0–5.6)

## 2023-08-27 LAB — GLUCOSE, CAPILLARY: Glucose-Capillary: 141 mg/dL — ABNORMAL HIGH (ref 70–99)

## 2023-08-27 MED ORDER — ALLOPURINOL 100 MG PO TABS: 100.0000 mg | ORAL_TABLET | Freq: Every day | ORAL | 3 refills | Status: DC

## 2023-08-27 NOTE — Assessment & Plan Note (Signed)
 On long-term tamsulosin  0.4 mg. Refill provided today

## 2023-08-27 NOTE — Patient Instructions (Addendum)
 Thank you, [name], for allowing us  to provide your care today. Today we discussed . . .  > Hypertension       - Your blood pressure is well controlled. Keep taking your medication as previously prescribed. > Gout       - Keep taking Allopurinol  100mg , avoiding purines and red meats. Avoid alcohol. > Tamsulosin        - I have refilled your tamsulosin  today. > Diabetes       -  Talk to endocrinology tomorrow about decreasing your insulin    I have ordered the following labs for you:  Lab Orders         Glucose, capillary         POC Hbg A1C       Referrals ordered today:   Referral Orders  No referral(s) requested today      Follow up: 3 months    Remember:  Should you have any questions or concerns please call the internal medicine clinic at [phone].     Melvenia Morrison, [facility]

## 2023-08-27 NOTE — Assessment & Plan Note (Addendum)
 A1c 5.4 today. Has appointment with endocrinology tomorrow. Current regimen tresiba  36U and novolog  flex pen sliding scale. He has not had any symptomatic hypoglycemic events.  -Defer diabetes management to endocrinology -Pt unable to void for MACR today

## 2023-08-27 NOTE — Assessment & Plan Note (Addendum)
 He has a history of hypertension. His current regimen is Coreg  25mg  BID, Amlodipine  10mg , clonidine  0.1mg /24 patch, Lasix  80 mg BID and hydralazine  100mg  TID. He reports that he is adherent to his medications. He denies headaches, chest pain, shortness of breath, peripheral edema, and vision changes. His BP in the office today is 130/71. Since his BP is well controlled at this time, we will not make any changes.  -Continue regimen of Coreg  25mg  BID, Amlodipine  10mg , clonidine  0.1mg /24 patch, Lasix  80 mg BID and hydralazine  100mg  TID

## 2023-08-27 NOTE — Assessment & Plan Note (Signed)
 Patient has had multiple gout exacerbations in the past.  Currently taking allopurinol  100 mg daily.  Uric acid downtrending from 14.8 in February of this year to 9.2 in July.  Currently with no pain.  At a follow-up visit in 3 months we could recheck a uric acid level and evaluate whether his allopurinol  should be increased or not.  -Continue allopurinol  100 mg daily.

## 2023-08-27 NOTE — Progress Notes (Signed)
 CC: Routine Follow Up for blood pressure and gout after last office visit 07/26/2023  HPI:  Ardie Mclennan is a 48 y.o. male with pertinent PMH of DMII, HFpEF, HTN, Gout, and CKD who presents for follow up for BP and gout. Please see problem based assessment and plan for further history.  Review of Systems  Constitutional:  Negative for chills and fever.  Respiratory:  Negative for shortness of breath.   Cardiovascular:  Negative for chest pain.  Neurological:  Negative for headaches.    Medications: Current Outpatient Medications  Medication Instructions   acetaminophen  (TYLENOL ) 650 mg, Oral, Every 6 hours   allopurinol  (ZYLOPRIM ) 100 mg, Oral, Daily   amLODipine  (NORVASC ) 10 mg, Daily at bedtime   apixaban  (ELIQUIS ) 5 mg, Oral, 2 times daily   carvedilol  (COREG ) 25 mg, Oral, 2 times daily with meals   cloNIDine  (CATAPRES  - DOSED IN MG/24 HR) 0.1 mg, Weekly   Continuous Glucose Receiver (FREESTYLE LIBRE 2 READER) DEVI Use with sensors to monitor blood glucose   Continuous Glucose Receiver (FREESTYLE LIBRE 2 READER) DEVI 1 each, Does not apply, Daily   Continuous Glucose Sensor (FREESTYLE LIBRE 2 SENSOR) MISC Change every 14 days as directed by manufacturer   diclofenac  Sodium (VOLTAREN ) 2 g, Topical, 4 times daily   fluticasone  (FLONASE ) 50 MCG/ACT nasal spray 2 sprays, Each Nare, Daily   furosemide  (LASIX ) 80 mg, Oral, 2 times daily   furosemide  (LASIX ) 20 mg   gabapentin  (NEURONTIN ) 100 MG capsule TAKE 1 CAPSULE BY MOUTH IN THE MORNING AND 1 IN THE AFTERNOON AND 2 NIGHTLY   hydrALAZINE  (APRESOLINE ) 100 mg, 3 times daily   Insulin  Pen Needle 32G X 4 MM MISC Use as directed   KLOR-CON  M20 20 MEQ tablet 20 mEq, 3 times daily   lidocaine  (LIDODERM ) 5 % 1 patch, Transdermal, Every 24 hours, Remove & Discard patch within 12 hours or as directed by MD   lovastatin  (MEVACOR ) 20 mg, Oral, Daily   NovoLOG  FlexPen 0-10 Units, Subcutaneous, 3 times daily with meals, Glucose 141-180:  inject 4 units, Glucose 181-220: inject 6 units, Glucose 221-260: inject 8 units, Glucose 261-300: inject 10 units, If glucose if greater than 300, please inject 12 units and call your PCP and/or endocrinologist office   [Paused] oxybutynin  (DITROPAN ) 5 mg, Oral, 3 times daily   predniSONE  (DELTASONE ) 10 MG tablet Take 4 tablets per day until 7/15, then 3 tabs per day until 7/19, then 2 tabs per day until 7/22, then 1 tab per day until 7/25, then stop   tamsulosin  (FLOMAX ) 0.4 mg, Oral, Daily at bedtime   tirzepatide  (MOUNJARO ) 15 mg, Subcutaneous, Weekly   Tresiba  FlexTouch 40 Units, Subcutaneous, Every 24 hours     Physical Exam:  Vitals:   08/27/23 1327 08/27/23 1331  BP: (!) 143/74 130/71  Pulse: 88 88  Resp: (!) 24   Temp: 98.5 F (36.9 C)   TempSrc: Oral   SpO2: 95%   Height: 6' 2 (1.88 m)     Physical Exam Constitutional:      General: He is not in acute distress.    Appearance: He is not ill-appearing.  Cardiovascular:     Rate and Rhythm: Normal rate and regular rhythm.  Pulmonary:     Effort: No respiratory distress.  Musculoskeletal:     Right lower leg: No edema.     Left lower leg: No edema.  Neurological:     Mental Status: He is alert.  Assessment & Plan:   Assessment & Plan Primary hypertension He has a history of hypertension. His current regimen is Coreg  25mg  BID, Amlodipine  10mg , clonidine  0.1mg /24 patch, Lasix  80 mg BID and hydralazine  100mg  TID. He reports that he is adherent to his medications. He denies headaches, chest pain, shortness of breath, peripheral edema, and vision changes. His BP in the office today is 130/71. Since his BP is well controlled at this time, we will not make any changes.  -Continue regimen of Coreg  25mg  BID, Amlodipine  10mg , clonidine  0.1mg /24 patch, Lasix  80 mg BID and hydralazine  100mg  TID  Insulin  dependent type 2 diabetes mellitus, controlled (HCC) A1c 5.4 today. Has appointment with endocrinology tomorrow.  Current regimen tresiba  36U and novolog  flex pen sliding scale. He has not had any symptomatic hypoglycemic events.  -Defer diabetes management to endocrinology -Pt unable to void for MACR today Idiopathic chronic gout of multiple sites without tophus Patient has had multiple gout exacerbations in the past.  Currently taking allopurinol  100 mg daily.  Uric acid downtrending from 14.8 in February of this year to 9.2 in July.  Currently with no pain.  At a follow-up visit in 3 months we could recheck a uric acid level and evaluate whether his allopurinol  should be increased or not.  -Continue allopurinol  100 mg daily. Benign prostatic hyperplasia with incomplete bladder emptying On long-term tamsulosin  0.4 mg. Refill provided today  Orders Placed This Encounter  Procedures   Glucose, capillary   POC Hbg A1C     Patient seen with Dr. Jone Dauphin   Melvenia Morrison, MD Internal [facility] Center Internal Medicine Resident PGY-1 Clinic Phone: [phone] Please contact the on call pager at [phone] for any urgent or emergent needs.

## 2023-08-28 ENCOUNTER — Encounter: Payer: Self-pay | Admitting: Endocrinology

## 2023-08-28 ENCOUNTER — Ambulatory Visit (INDEPENDENT_AMBULATORY_CARE_PROVIDER_SITE_OTHER): Admitting: Endocrinology

## 2023-08-28 DIAGNOSIS — E119 Type 2 diabetes mellitus without complications: Secondary | ICD-10-CM | POA: Diagnosis not present

## 2023-08-28 DIAGNOSIS — Z794 Long term (current) use of insulin: Secondary | ICD-10-CM

## 2023-08-28 MED ORDER — TRESIBA FLEXTOUCH 200 UNIT/ML ~~LOC~~ SOPN
30.0000 [IU] | PEN_INJECTOR | SUBCUTANEOUS | 1 refills | Status: DC
Start: 2023-08-28 — End: 2023-11-06

## 2023-08-28 NOTE — Progress Notes (Signed)
 Internal Medicine Clinic Attending  I was physically present during the key portions of the resident provided service and participated in the medical decision making of patient's management care. I reviewed pertinent patient test results.  The assessment, diagnosis, and plan were formulated together and I agree with the documentation in the resident's note.  Will attempt to add on uric acid to labs obtained ~3 days ago. If unable, will obtain uric acid level with next set of labs. Pt currently asymptomatic and hesitant to increase medications. He reports strict adherence to a low purine diet, abstinence from EtOH.   Jone Dauphin MD

## 2023-08-28 NOTE — Progress Notes (Signed)
 Outpatient Endocrinology Note [place] Deirdra Heumann, MD  08/28/23  Patient's Name: [name]    DOB: [birth year]    MRN: [id number]                                                    REASON OF VISIT: Follow up for type 2 diabetes mellitus  PCP: Renne Homans, MD  HISTORY OF PRESENT ILLNESS:   [name] is a 48 y.o. old male with past medical history listed below, is here for follow up of type 2 diabetes mellitus.   Pertinent Diabetes History: Patient was diagnosed with type 2 diabetes mellitus in 2001.  Weight was 400 pounds at the time of diagnosis.   Chronic Diabetes Complications : Retinopathy: Unknown.  Reports following with retina specialist however denies diabetes retinopathy. Nephropathy: CKD IV following with nephrology.  Microalbuminuria present. Peripheral neuropathy: Diabetic foot ulcers, status post right AKA 2015/2016. Coronary artery disease: no Stroke: no  Relevant comorbidities and cardiovascular risk factors: Obesity: yes Body mass index is 40.44 kg/m.  Hypertension: yes Hyperlipidemia. Yes, on a statin.  Current / Home Diabetic regimen includes: Tresiba  36 units daily. NovoLog  0 units breakfast 0-4 units before lunch and 0-4 units at dinner.  Lately has not been taking NovoLog . Mounjaro  15 mg weekly.  Prior diabetic medications: Metformin  stopped due to diarrhea. Ozempic  had vomiting.  Glycemic data:    CONTINUOUS GLUCOSE MONITORING SYSTEM (CGMS) INTERPRETATION:                         FreeStyle Libre 2 CGM-  Sensor Download (Sensor download was reviewed and summarized below.)  Dates: August 6 to August 28, 2023, 14 days Sensor Average: 114  Glucose Management Indicator: 6%  % data captured: 82%    Impression: - Mostly acceptable blood sugar.  Random hypoglycemia with blood sugar in 60s in the early morning fasting during workout and sometime in the late afternoon.  No concerning hypoglycemia.  Hypoglycemia: Patient has minor hypoglycemic  episodes. Patient has hypoglycemia awareness.  Factors modifying glucose control: 1.  Diabetic diet assessment: 2 meals a day.  2.  Staying active or exercising: Workout a mostly every day 5-6 times a week in the morning.  3.  Medication compliance: compliant all of the time.  Interval history  CGM data as reviewed above.  Diabetes regimen as reviewed and noted above.  He has not been requiring NovoLog  lately.  He regularly workout.  He reports he had taken prednisone  for several days for gout flare.  Hemoglobin A1c 5.4%.  No other complaints today.  REVIEW OF SYSTEMS As per history of present illness.   PAST MEDICAL HISTORY: Past Medical History:  Diagnosis Date   Acute venous embolism and thrombosis of deep vessels of proximal lower extremity (HCC) 07/19/2011   Anemia    Anginal pain (HCC)    pt denies   Anxiety    Chest pain, neg MI, normal coronaries by cath 02/18/2013   pt denies   CHF (congestive heart failure) (HCC)    Chronic renal disease, stage IV (HCC) 02/19/2013   Colon polyps    Depression    Diabetic ulcer of right foot (HCC)    DVT (deep venous thrombosis) (HCC) 09/2002   patient reports additional DVTs in '06 & '11 (unconfirmed)   ED (erectile  dysfunction)    GERD (gastroesophageal reflux disease)    History of blood transfusion    related to OR (10/31/2016)   Hyperlipidemia 02/19/2013   Hypertension    Nausea & vomiting    constant for the last couple weeks (10/31/2016)   Nephrotic syndrome    Obesity    BMI 44, weight 346 pounds 01/30/14   OSA (obstructive sleep apnea) 02/28/2018   Mild obstructive sleep apnea with an AHI of 9.8/h but severe during REM sleep with an AHI of 33.8/h.  Oxygen  saturations dropped to 86% and there was moderate snoring   Peripheral edema    Pneumonia    Prosthesis adjustment 08/17/2016   Pulmonary embolism (HCC) 09/2002   treated with 6 months of warfarin   Pyelonephritis 02/02/2018   Type I diabetes mellitus (HCC)  dx'd 2001   Urinary retention     PAST SURGICAL HISTORY: Past Surgical History:  Procedure Laterality Date   AMPUTATION Right 12/22/2013   Procedure: AMPUTATION BELOW KNEE;  Surgeon: Norleen Armor, MD;  Location: [facility] OR;  Service: Orthopedics;  Laterality: Right;   AMPUTATION Right 02/19/2014   Procedure: RIGHT ABOVE KNEE AMPUTATION ;  Surgeon: Norleen Armor, MD;  Location: [facility] OR;  Service: Orthopedics;  Laterality: Right;   blood clot removal  2016   CARDIAC CATHETERIZATION  02/18/2013   normal coronaries   ESOPHAGOGASTRODUODENOSCOPY N/A 11/02/2016   Procedure: ESOPHAGOGASTRODUODENOSCOPY (EGD);  Surgeon: Avram Lupita BRAVO, MD;  Location: [facility] ENDOSCOPY;  Service: Endoscopy;  Laterality: N/A;   I & D EXTREMITY Right 12/14/2013   Procedure: IRRIGATION AND DEBRIDEMENT RIGHT FOOT;  Surgeon: Elspeth JONELLE Her, MD;  Location: [facility] OR;  Service: Orthopedics;  Laterality: Right;   INCISION AND DRAINAGE ABSCESS  2007; 2015   back   INCISION AND DRAINAGE OF WOUND Right 12/22/2013   Procedure: I&D RIGHT BUTTOCK;  Surgeon: Norleen Armor, MD;  Location: [facility] OR;  Service: Orthopedics;  Laterality: Right;   LEFT HEART CATHETERIZATION WITH CORONARY ANGIOGRAM N/A 02/18/2013   Procedure: LEFT HEART CATHETERIZATION WITH CORONARY ANGIOGRAM;  Surgeon: Peter M Swaziland, MD;  Location: [facility] CATH LAB;  Service: Cardiovascular;  Laterality: N/A;    ALLERGIES: Allergies  Allergen Reactions   Reglan  [Metoclopramide ] Other (See Comments)    Dysphoric reaction   Chlorthalidone Hives   Metformin  And Related Diarrhea   Nsaids Other (See Comments)    Chronic kidney disease IV   Ozempic  (0.25 Or 0.5 Mg-Dose) [Semaglutide (0.25 Or 0.5mg -Dos)] Nausea And Vomiting   Thiazide-Type Diuretics Other (See Comments)    Gout    FAMILY HISTORY:  Family History  Problem Relation Age of Onset   Heart disease Mother    Diabetes Mother    Diabetes Maternal Uncle    Diabetes Cousin     SOCIAL HISTORY: Social History   Socioeconomic  History   Marital status: Single    Spouse name: Not on file   Number of children: 0   Years of education: 15.5   Highest education level: Some college, no degree  Occupational History   Occupation: Consulting civil engineer    Comment: [employer]   Occupation: disability  Tobacco Use   Smoking status: Never   Smokeless tobacco: Never  Vaping Use   Vaping status: Never Used  Substance and Sexual Activity   Alcohol use: Yes    Comment: 10/31/2016 a few beers q 6 months or so   Drug use: No   Sexual activity: Yes  Other Topics Concern   Not on file  Social History  Narrative   Financial assistance approved for 100% discount at [facility] and has [facility] card per [employer]   09/01/2009.      Lives in [place] with mother and sister.            Social Drivers of Health   Financial Resource Strain: Medium Risk (01/31/2023)   Overall Financial Resource Strain (CARDIA)    Difficulty of Paying Living Expenses: Somewhat hard  Food Insecurity: No Food Insecurity (07/20/2023)   Hunger Vital Sign    Worried About Running Out of Food in the Last Year: Never true    Ran Out of Food in the Last Year: Never true  Transportation Needs: No Transportation Needs (07/20/2023)   PRAPARE - Administrator, Civil Service (Medical): No    Lack of Transportation (Non-Medical): No  Physical Activity: Sufficiently Active (01/31/2023)   Exercise Vital Sign    Days of Exercise per Week: 6 days    Minutes of Exercise per Session: 120 min  Stress: No Stress Concern Present (01/31/2023)   [employer] of Occupational Health - Occupational Stress Questionnaire    Feeling of Stress : Not at all  Social Connections: Socially Isolated (07/20/2023)   Social Connection and Isolation Panel    Frequency of Communication with Friends and Family: More than three times a week    Frequency of Social Gatherings with Friends and Family: More than three times a week    Attends Religious Services: Never    Database administrator  or Organizations: No    Attends Banker Meetings: Never    Marital Status: Never married    MEDICATIONS:  Current Outpatient Medications  Medication Sig Dispense Refill   acetaminophen  (TYLENOL ) 325 MG tablet Take 2 tablets (650 mg total) by mouth every 6 (six) hours. 30 tablet 0   allopurinol  (ZYLOPRIM ) 100 MG tablet Take 1 tablet (100 mg total) by mouth daily. 30 tablet 3   amLODipine  (NORVASC ) 10 MG tablet Take 10 mg by mouth at bedtime.     apixaban  (ELIQUIS ) 5 MG TABS tablet Take 1 tablet (5 mg total) by mouth 2 (two) times daily. 180 tablet 0   carvedilol  (COREG ) 25 MG tablet TAKE 1 TABLET BY MOUTH TWICE DAILY WITH A MEAL 180 tablet 0   cloNIDine  (CATAPRES  - DOSED IN MG/24 HR) 0.1 mg/24hr patch Place 0.1 mg onto the skin once a week. Thursdays     Continuous Glucose Receiver (FREESTYLE LIBRE 2 READER) DEVI Use with sensors to monitor blood glucose 1 each 0   Continuous Glucose Receiver (FREESTYLE LIBRE 2 READER) DEVI 1 each by Does not apply route daily. 1 each 0   Continuous Glucose Sensor (FREESTYLE LIBRE 2 SENSOR) MISC Change every 14 days as directed by manufacturer 6 each 3   diclofenac  Sodium (VOLTAREN ) 1 % GEL Apply 2 g topically 4 (four) times daily. (Patient taking differently: Apply 2 g topically daily as needed (for pain).) 100 g 3   fluticasone  (FLONASE ) 50 MCG/ACT nasal spray Place 2 sprays into both nostrils daily. (Patient taking differently: Place 2 sprays into both nostrils daily as needed for allergies.) 15.8 mL 2   furosemide  (LASIX ) 20 MG tablet Take 20 mg by mouth.     furosemide  (LASIX ) 80 MG tablet Take 1 tablet (80 mg total) by mouth 2 (two) times daily. (Patient taking differently: Take 80 mg by mouth daily.) 30 tablet 1   gabapentin  (NEURONTIN ) 100 MG capsule TAKE 1 CAPSULE BY MOUTH IN  THE MORNING AND 1 IN THE AFTERNOON AND 2 NIGHTLY 270 capsule 0   hydrALAZINE  (APRESOLINE ) 100 MG tablet Take 100 mg by mouth 3 (three) times daily.     Insulin  Pen  Needle 32G X 4 MM MISC Use as directed 100 each 0   KLOR-CON  M20 20 MEQ tablet Take 20 mEq by mouth 3 (three) times daily.     lidocaine  (LIDODERM ) 5 % Place 1 patch onto the skin daily. Remove & Discard patch within 12 hours or as directed by MD 30 patch 0   lovastatin  (MEVACOR ) 20 MG tablet Take 1 tablet (20 mg total) by mouth daily. 90 tablet 0   NOVOLOG  FLEXPEN 100 UNIT/ML FlexPen Inject 0-10 Units into the skin 3 (three) times daily with meals. Glucose 141-180: inject 4 units, Glucose 181-220: inject 6 units, Glucose 221-260: inject 8 units, Glucose 261-300: inject 10 units, If glucose if greater than 300, please inject 12 units and call your PCP and/or endocrinologist office 30 mL 4   predniSONE  (DELTASONE ) 10 MG tablet Take 4 tablets per day until 7/15, then 3 tabs per day until 7/19, then 2 tabs per day until 7/22, then 1 tab per day until 7/25, then stop 34 tablet 0   tamsulosin  (FLOMAX ) 0.4 MG CAPS capsule Take 1 capsule by mouth at bedtime 90 capsule 0   tirzepatide  (MOUNJARO ) 15 MG/0.5ML Pen Inject 15 mg into the skin once a week. 6 mL 4   insulin  degludec (TRESIBA  FLEXTOUCH) 200 UNIT/ML FlexTouch Pen Inject 30 Units into the skin daily. 6 mL 1   No current facility-administered medications for this visit.    PHYSICAL EXAM: Vitals:   08/28/23 1038  BP: 132/70  Pulse: 76  Resp: 20  SpO2: 98%  Weight: (!) 315 lb (142.9 kg)  Height: 6' 2 (1.88 m)      Body mass index is 40.44 kg/m.  Wt Readings from Last 3 Encounters:  08/28/23 (!) 315 lb (142.9 kg)  07/19/23 (!) 321 lb (145.6 kg)  06/11/23 (!) 319 lb (144.7 kg)    General: Well developed, well nourished male in no apparent distress.  HEENT: AT/Novelty, no external lesions.  Eyes: Conjunctiva clear and no icterus. Neck: Neck supple  Lungs: Respirations not labored Neurologic: Alert, oriented, normal speech Extremities / Skin: Dry. Rt AKA. Psychiatric: Does not appear depressed or anxious  Diabetic Foot Exam - Simple    No data filed    LABS Reviewed Lab Results  Component Value Date   HGBA1C 5.4 08/27/2023   HGBA1C 4.9 03/26/2023   HGBA1C 5.2 12/06/2022   Lab Results  Component Value Date   FRUCTOSAMINE 271 08/28/2022   FRUCTOSAMINE 299 (H) 05/09/2022   FRUCTOSAMINE 294 (H) 08/23/2021   Lab Results  Component Value Date   CHOL 119 12/30/2021   HDL 38.10 (L) 12/30/2021   LDLCALC 60 12/30/2021   TRIG 104.0 12/30/2021   CHOLHDL 3 12/30/2021   Lab Results  Component Value Date   MICRALBCREAT 1,528.4 (H) 02/15/2017   MICRALBCREAT 818.2 (H) 08/14/2013   Lab Results  Component Value Date   CREATININE 2.92 (H) 08/24/2023   Lab Results  Component Value Date   GFR 28.51 (L) 04/15/2020    ASSESSMENT / PLAN  1. Insulin  dependent type 2 diabetes mellitus, controlled (HCC)    Diabetes Mellitus type 2, complicated by peripheral neuropathy status post right BKA, CKD. - Diabetic status / severity: Controlled  Lab Results  Component Value Date   HGBA1C 5.4 08/27/2023    -  Hemoglobin A1c goal : <6.5%  Overall controlled type 2 diabetes mellitus.  Hemoglobin A1c likely falsely lowered due to anemia.  GMI on CGM 6%.  He has not been requiring NovoLog  lately.  He is mainly taking Tresiba  36 units daily and Mounjaro  15 mg weekly.  - Medications: See below.  Decrease Tresiba  from 36 to 30 units daily.  Due to hypoglycemia.  Continue Mounjaro  15 mg weekly.  Novolog  0-4 units with meals 2 times a day , may adjust based on meals and experience.  No NovoLog  with breakfast.  - Home glucose testing: Continue CGM and check as needed.   - Discussed/ Gave Hypoglycemia treatment plan.  He has Glucagon  Emergency Kit prescribed.  Advised to use glucose tablet for correction of hypoglycemia.  # Consult : not required at this time.   # He has diabetic nephropathy, following with nephrology.  Will check urine microalbumin creatinine ratio today.  He reports he has follow-up with oncology in few  days. Last  Lab Results  Component Value Date   MICRALBCREAT 1,528.4 (H) 02/15/2017    # Foot check nightly, history of diabetic foot ulcer status post right AKA.  # Annual dilated diabetic eye exams.  Following with retina specialist, status of retinopathy unknown at this time.  - Diet: Eat reasonable portion sizes to promote a healthy weight - Life style / activity / exercise: Discussed.  He regularly workout 5-6 times a week.  2. Blood pressure  -  BP Readings from Last 1 Encounters:  08/28/23 132/70    - Control is in target. Blood pressure acceptable. - No change in current plans.  3. Lipid status / Hyperlipidemia - Last  Lab Results  Component Value Date   LDLCALC 60 12/30/2021   - Continue lovastatin  20 mg daily.  Managed by primary care provider.  Diagnoses and all orders for this visit:  Insulin  dependent type 2 diabetes mellitus, controlled (HCC) -     insulin  degludec (TRESIBA  FLEXTOUCH) 200 UNIT/ML FlexTouch Pen; Inject 30 Units into the skin daily.    DISPOSITION Follow up in clinic in 3 months suggested.     All questions answered and patient verbalized understanding of the plan.  [place] Joseeduardo Brix, MD [facility] Endocrinology [facility] [address] [place], [address] [place], [place] [address] Phone # [phone]  At least part of this note was generated using voice recognition software. Inadvertent word errors may have occurred, which were not recognized during the proofreading process.

## 2023-08-29 ENCOUNTER — Ambulatory Visit: Payer: Self-pay | Admitting: Endocrinology

## 2023-08-29 LAB — MICROALBUMIN / CREATININE URINE RATIO
Creatinine, Urine: 65 mg/dL (ref 20–320)
Microalb Creat Ratio: 242 mg/g{creat} — ABNORMAL HIGH (ref <30)
Microalb, Ur: 15.7 mg/dL

## 2023-08-30 ENCOUNTER — Telehealth: Payer: Self-pay | Admitting: Student

## 2023-08-30 ENCOUNTER — Ambulatory Visit: Admitting: Skilled Nursing Facility1

## 2023-08-30 NOTE — Telephone Encounter (Signed)
 Copied from CRM #[id number]. Topic: Clinical - Medication Refill >> Aug 30, 2023  3:33 PM Susanna ORN wrote: Medication:  tamsulosin  (FLOMAX ) 0.4 MG CAPS capsule Has the patient contacted their pharmacy? Yes, pharmacy states they haven't received a response back. (Agent: If no, request that the patient contact the pharmacy for the refill. If patient does not wish to contact the pharmacy document the reason why and proceed with request.) (Agent: If yes, when and what did the pharmacy advise?)  This is the patient's preferred pharmacy:  [id number] - [place] - [address] [place]) [place] [address] Phone: [phone] Fax: [phone]  Is this the correct pharmacy for this prescription? Yes If no, delete pharmacy and type the correct one.   Has the prescription been filled recently? Yes  Is the patient out of the medication? Yes, needs ASAP.   Has the patient been seen for an appointment in the last year OR does the patient have an upcoming appointment? Yes  Can we respond through MyChart? Yes  Agent: Please be advised that Rx refills may take up to 3 business days. We ask that you follow-up with your pharmacy.

## 2023-09-03 MED ORDER — TAMSULOSIN HCL 0.4 MG PO CAPS: 0.4000 mg | ORAL_CAPSULE | Freq: Every day | ORAL | 0 refills | Status: DC

## 2023-09-04 DIAGNOSIS — N184 Chronic kidney disease, stage 4 (severe): Secondary | ICD-10-CM | POA: Diagnosis not present

## 2023-09-04 DIAGNOSIS — I5030 Unspecified diastolic (congestive) heart failure: Secondary | ICD-10-CM | POA: Diagnosis not present

## 2023-09-04 DIAGNOSIS — E1122 Type 2 diabetes mellitus with diabetic chronic kidney disease: Secondary | ICD-10-CM | POA: Diagnosis not present

## 2023-09-04 DIAGNOSIS — D631 Anemia in chronic kidney disease: Secondary | ICD-10-CM | POA: Diagnosis not present

## 2023-09-04 DIAGNOSIS — I130 Hypertensive heart and chronic kidney disease with heart failure and stage 1 through stage 4 chronic kidney disease, or unspecified chronic kidney disease: Secondary | ICD-10-CM | POA: Diagnosis not present

## 2023-09-04 DIAGNOSIS — N2581 Secondary hyperparathyroidism of renal origin: Secondary | ICD-10-CM | POA: Diagnosis not present

## 2023-09-07 ENCOUNTER — Encounter (HOSPITAL_COMMUNITY)
Admission: RE | Admit: 2023-09-07 | Discharge: 2023-09-07 | Disposition: A | Discharge status: Home / Self Care | Source: Ambulatory Visit | Attending: Nephrology | Admitting: Nephrology

## 2023-09-07 VITALS — BP 137/71 | HR 75 | Temp 97.3°F | Resp 18

## 2023-09-07 DIAGNOSIS — D631 Anemia in chronic kidney disease: Secondary | ICD-10-CM | POA: Diagnosis not present

## 2023-09-07 DIAGNOSIS — E113311 Type 2 diabetes mellitus with moderate nonproliferative diabetic retinopathy with macular edema, right eye: Secondary | ICD-10-CM | POA: Diagnosis not present

## 2023-09-07 DIAGNOSIS — N184 Chronic kidney disease, stage 4 (severe): Secondary | ICD-10-CM

## 2023-09-07 LAB — IRON AND TIBC
Iron: 52 ug/dL (ref 45–182)
Saturation Ratios: 17 % — ABNORMAL LOW (ref 17.9–39.5)
TIBC: 301 ug/dL (ref 250–450)
UIBC: 249 ug/dL

## 2023-09-07 LAB — RENAL FUNCTION PANEL
Albumin: 3.4 g/dL — ABNORMAL LOW (ref 3.5–5.0)
Anion gap: 12 (ref 5–15)
BUN: 59 mg/dL — ABNORMAL HIGH (ref 6–20)
CO2: 22 mmol/L (ref 22–32)
Calcium: 8.8 mg/dL — ABNORMAL LOW (ref 8.9–10.3)
Chloride: 104 mmol/L (ref 98–111)
Creatinine, Ser: 3.01 mg/dL — ABNORMAL HIGH (ref 0.61–1.24)
GFR, Estimated: 25 mL/min — ABNORMAL LOW (ref >60)
Glucose, Bld: 100 mg/dL — ABNORMAL HIGH (ref 70–99)
Phosphorus: 4.5 mg/dL (ref 2.5–4.6)
Potassium: 4.3 mmol/L (ref 3.5–5.1)
Sodium: 138 mmol/L (ref 135–145)

## 2023-09-07 LAB — POCT HEMOGLOBIN-HEMACUE: Hemoglobin: 10.1 g/dL — ABNORMAL LOW (ref 13.0–17.0)

## 2023-09-07 LAB — FERRITIN: Ferritin: 245 ng/mL (ref 24–336)

## 2023-09-07 MED ORDER — DARBEPOETIN ALFA 200 MCG/0.4ML IJ SOSY
PREFILLED_SYRINGE | INTRAMUSCULAR | Status: AC
  Filled 2023-09-07: qty 0.4

## 2023-09-07 MED ORDER — DARBEPOETIN ALFA 200 MCG/0.4ML IJ SOSY
200.0000 ug | PREFILLED_SYRINGE | INTRAMUSCULAR | Status: DC
  Administered 2023-09-07: 200 ug via SUBCUTANEOUS

## 2023-09-09 ENCOUNTER — Other Ambulatory Visit: Payer: Self-pay | Admitting: Student

## 2023-09-11 NOTE — Telephone Encounter (Signed)
 Medication discontinued 08/27/23

## 2023-09-13 ENCOUNTER — Other Ambulatory Visit: Payer: Self-pay | Admitting: Student

## 2023-09-13 NOTE — Telephone Encounter (Signed)
 Medication sent to pharmacy

## 2023-09-14 ENCOUNTER — Telehealth: Payer: Self-pay | Admitting: Pharmacy Technician

## 2023-09-14 NOTE — Telephone Encounter (Signed)
 Auth Submission: NO AUTH NEEDED Site of care: Site of care: [facility] Payer: [employer]HC DUAL Medication & CPT/J Code(s) submitted: ARANESP  [id number] Diagnosis Code: N18.4 Route of submission (phone, fax, portal):  Phone # Fax # Auth type: Buy/Bill HB Units/visits requested: Q14 DAYS Reference number:  Approval from: 06/22/23 to 01/09/24

## 2023-09-18 DIAGNOSIS — D631 Anemia in chronic kidney disease: Secondary | ICD-10-CM | POA: Insufficient documentation

## 2023-09-21 ENCOUNTER — Encounter (HOSPITAL_COMMUNITY)
Admission: RE | Admit: 2023-09-21 | Discharge: 2023-09-21 | Disposition: A | Discharge status: Home / Self Care | Source: Ambulatory Visit | Attending: Nephrology | Admitting: Nephrology

## 2023-09-21 VITALS — BP 136/75 | HR 94 | Temp 97.5°F | Resp 17

## 2023-09-21 DIAGNOSIS — D631 Anemia in chronic kidney disease: Secondary | ICD-10-CM | POA: Insufficient documentation

## 2023-09-21 DIAGNOSIS — N185 Chronic kidney disease, stage 5: Secondary | ICD-10-CM | POA: Insufficient documentation

## 2023-09-21 LAB — RENAL FUNCTION PANEL
Albumin: 3.4 g/dL — ABNORMAL LOW (ref 3.5–5.0)
Anion gap: 10 (ref 5–15)
BUN: 57 mg/dL — ABNORMAL HIGH (ref 6–20)
CO2: 20 mmol/L — ABNORMAL LOW (ref 22–32)
Calcium: 8.8 mg/dL — ABNORMAL LOW (ref 8.9–10.3)
Chloride: 106 mmol/L (ref 98–111)
Creatinine, Ser: 3.05 mg/dL — ABNORMAL HIGH (ref 0.61–1.24)
GFR, Estimated: 25 mL/min — ABNORMAL LOW (ref >60)
Glucose, Bld: 72 mg/dL (ref 70–99)
Phosphorus: 3.5 mg/dL (ref 2.5–4.6)
Potassium: 4.5 mmol/L (ref 3.5–5.1)
Sodium: 136 mmol/L (ref 135–145)

## 2023-09-21 LAB — IRON AND TIBC
Iron: 42 ug/dL — ABNORMAL LOW (ref 45–182)
Saturation Ratios: 14 % — ABNORMAL LOW (ref 17.9–39.5)
TIBC: 291 ug/dL (ref 250–450)
UIBC: 249 ug/dL

## 2023-09-21 LAB — POCT HEMOGLOBIN-HEMACUE: Hemoglobin: 10.8 g/dL — ABNORMAL LOW (ref 13.0–17.0)

## 2023-09-21 LAB — FERRITIN: Ferritin: 189 ng/mL (ref 24–336)

## 2023-09-21 MED ORDER — DARBEPOETIN ALFA 200 MCG/0.4ML IJ SOSY
200.0000 ug | PREFILLED_SYRINGE | Freq: Once | INTRAMUSCULAR | Status: AC
  Administered 2023-09-21: 200 ug via SUBCUTANEOUS

## 2023-09-21 MED ORDER — DARBEPOETIN ALFA 200 MCG/0.4ML IJ SOSY
PREFILLED_SYRINGE | INTRAMUSCULAR | Status: AC
  Filled 2023-09-21: qty 0.4

## 2023-09-22 ENCOUNTER — Other Ambulatory Visit: Payer: Self-pay | Admitting: Student

## 2023-09-22 DIAGNOSIS — I5032 Chronic diastolic (congestive) heart failure: Secondary | ICD-10-CM

## 2023-09-24 NOTE — Telephone Encounter (Signed)
 Medication sent to pharmacy

## 2023-10-02 ENCOUNTER — Encounter: Admitting: Student

## 2023-10-04 ENCOUNTER — Other Ambulatory Visit: Payer: Self-pay | Admitting: Student

## 2023-10-05 ENCOUNTER — Encounter (HOSPITAL_COMMUNITY)
Admission: RE | Admit: 2023-10-05 | Discharge: 2023-10-05 | Disposition: A | Discharge status: Home / Self Care | Source: Ambulatory Visit | Attending: Nephrology | Admitting: Nephrology

## 2023-10-05 VITALS — BP 154/75 | HR 95 | Temp 97.2°F | Resp 17

## 2023-10-05 DIAGNOSIS — N185 Chronic kidney disease, stage 5: Secondary | ICD-10-CM | POA: Diagnosis not present

## 2023-10-05 DIAGNOSIS — D631 Anemia in chronic kidney disease: Secondary | ICD-10-CM

## 2023-10-05 LAB — RENAL FUNCTION PANEL
Albumin: 3.3 g/dL — ABNORMAL LOW (ref 3.5–5.0)
Anion gap: 13 (ref 5–15)
BUN: 51 mg/dL — ABNORMAL HIGH (ref 6–20)
CO2: 22 mmol/L (ref 22–32)
Calcium: 8.8 mg/dL — ABNORMAL LOW (ref 8.9–10.3)
Chloride: 103 mmol/L (ref 98–111)
Creatinine, Ser: 3.02 mg/dL — ABNORMAL HIGH (ref 0.61–1.24)
GFR, Estimated: 25 mL/min — ABNORMAL LOW (ref >60)
Glucose, Bld: 94 mg/dL (ref 70–99)
Phosphorus: 4.3 mg/dL (ref 2.5–4.6)
Potassium: 4.5 mmol/L (ref 3.5–5.1)
Sodium: 138 mmol/L (ref 135–145)

## 2023-10-05 LAB — IRON AND TIBC
Iron: 34 ug/dL — ABNORMAL LOW (ref 45–182)
Saturation Ratios: 12 % — ABNORMAL LOW (ref 17.9–39.5)
TIBC: 295 ug/dL (ref 250–450)
UIBC: 261 ug/dL

## 2023-10-05 LAB — FERRITIN: Ferritin: 149 ng/mL (ref 24–336)

## 2023-10-05 LAB — POCT HEMOGLOBIN-HEMACUE: Hemoglobin: 12 g/dL — ABNORMAL LOW (ref 13.0–17.0)

## 2023-10-05 MED ORDER — DARBEPOETIN ALFA 200 MCG/0.4ML IJ SOSY: 200.0000 ug | PREFILLED_SYRINGE | Freq: Once | INTRAMUSCULAR | Status: DC

## 2023-10-05 MED ORDER — DARBEPOETIN ALFA 200 MCG/0.4ML IJ SOSY
PREFILLED_SYRINGE | INTRAMUSCULAR | Status: AC
  Filled 2023-10-05: qty 0.4

## 2023-10-08 DIAGNOSIS — E113212 Type 2 diabetes mellitus with mild nonproliferative diabetic retinopathy with macular edema, left eye: Secondary | ICD-10-CM | POA: Diagnosis not present

## 2023-10-08 DIAGNOSIS — H2513 Age-related nuclear cataract, bilateral: Secondary | ICD-10-CM | POA: Diagnosis not present

## 2023-10-08 DIAGNOSIS — H3582 Retinal ischemia: Secondary | ICD-10-CM | POA: Diagnosis not present

## 2023-10-08 DIAGNOSIS — E113311 Type 2 diabetes mellitus with moderate nonproliferative diabetic retinopathy with macular edema, right eye: Secondary | ICD-10-CM | POA: Diagnosis not present

## 2023-10-08 DIAGNOSIS — H35033 Hypertensive retinopathy, bilateral: Secondary | ICD-10-CM | POA: Diagnosis not present

## 2023-10-12 DIAGNOSIS — E1122 Type 2 diabetes mellitus with diabetic chronic kidney disease: Secondary | ICD-10-CM | POA: Diagnosis not present

## 2023-10-12 DIAGNOSIS — N2581 Secondary hyperparathyroidism of renal origin: Secondary | ICD-10-CM | POA: Diagnosis not present

## 2023-10-12 DIAGNOSIS — D631 Anemia in chronic kidney disease: Secondary | ICD-10-CM | POA: Diagnosis not present

## 2023-10-12 DIAGNOSIS — Z23 Encounter for immunization: Secondary | ICD-10-CM | POA: Diagnosis not present

## 2023-10-12 DIAGNOSIS — I5030 Unspecified diastolic (congestive) heart failure: Secondary | ICD-10-CM | POA: Diagnosis not present

## 2023-10-12 DIAGNOSIS — N184 Chronic kidney disease, stage 4 (severe): Secondary | ICD-10-CM | POA: Diagnosis not present

## 2023-10-12 DIAGNOSIS — I10 Essential (primary) hypertension: Secondary | ICD-10-CM | POA: Diagnosis not present

## 2023-10-12 DIAGNOSIS — I130 Hypertensive heart and chronic kidney disease with heart failure and stage 1 through stage 4 chronic kidney disease, or unspecified chronic kidney disease: Secondary | ICD-10-CM | POA: Diagnosis not present

## 2023-10-12 DIAGNOSIS — N189 Chronic kidney disease, unspecified: Secondary | ICD-10-CM | POA: Diagnosis not present

## 2023-10-14 ENCOUNTER — Other Ambulatory Visit: Payer: Self-pay | Admitting: Endocrinology

## 2023-10-15 ENCOUNTER — Telehealth: Payer: Self-pay | Admitting: *Deleted

## 2023-10-15 NOTE — Telephone Encounter (Signed)
 Copied from CRM #[id number]. Topic: Clinical - Medication Question >> Oct 15, 2023  9:41 AM Diannia H wrote: Reason for CRM: Patient is calling about his (Refused:oxyBUTYnin  Chloride 5 mg Oral 3 times daily) it was denied and he is out of the medicine and wants to know why. Could you assist? Patients callback number is [phone]

## 2023-10-16 ENCOUNTER — Other Ambulatory Visit: Payer: Self-pay | Admitting: Student

## 2023-10-16 MED ORDER — OXYBUTYNIN CHLORIDE 5 MG PO TABS: 5.0000 mg | ORAL_TABLET | Freq: Three times a day (TID) | ORAL | 2 refills | Status: DC

## 2023-10-16 NOTE — Telephone Encounter (Signed)
Pt was called and informed of refill. 

## 2023-10-18 ENCOUNTER — Telehealth: Payer: Self-pay | Admitting: Dietician

## 2023-10-18 NOTE — Telephone Encounter (Signed)
 Patient called to have his pharmacy changed in the system. Will send to the pool.  Leita Constable, RD, LDN, CDCES, DipACLM

## 2023-10-18 NOTE — Telephone Encounter (Signed)
 Pharmacy updated as requested by patient.

## 2023-10-19 ENCOUNTER — Telehealth: Payer: Self-pay | Admitting: Student

## 2023-10-19 ENCOUNTER — Ambulatory Visit (HOSPITAL_COMMUNITY)
Admission: RE | Admit: 2023-10-19 | Discharge: 2023-10-19 | Disposition: A | Discharge status: Home / Self Care | Source: Ambulatory Visit | Attending: Nephrology | Admitting: Nephrology

## 2023-10-19 ENCOUNTER — Encounter (HOSPITAL_COMMUNITY)

## 2023-10-19 VITALS — BP 135/72 | HR 103 | Temp 97.9°F | Resp 16

## 2023-10-19 DIAGNOSIS — D631 Anemia in chronic kidney disease: Secondary | ICD-10-CM | POA: Diagnosis present

## 2023-10-19 DIAGNOSIS — N185 Chronic kidney disease, stage 5: Secondary | ICD-10-CM | POA: Diagnosis present

## 2023-10-19 DIAGNOSIS — E1165 Type 2 diabetes mellitus with hyperglycemia: Secondary | ICD-10-CM

## 2023-10-19 LAB — POCT HEMOGLOBIN-HEMACUE: Hemoglobin: 11.5 g/dL — ABNORMAL LOW (ref 13.0–17.0)

## 2023-10-19 LAB — IRON AND TIBC
Iron: 16 ug/dL — ABNORMAL LOW (ref 45–182)
Saturation Ratios: 6 % — ABNORMAL LOW (ref 17.9–39.5)
TIBC: 272 ug/dL (ref 250–450)
UIBC: 256 ug/dL

## 2023-10-19 LAB — RENAL FUNCTION PANEL
Albumin: 3.2 g/dL — ABNORMAL LOW (ref 3.5–5.0)
Anion gap: 11 (ref 5–15)
BUN: 66 mg/dL — ABNORMAL HIGH (ref 6–20)
CO2: 23 mmol/L (ref 22–32)
Calcium: 9.1 mg/dL (ref 8.9–10.3)
Chloride: 106 mmol/L (ref 98–111)
Creatinine, Ser: 3.55 mg/dL — ABNORMAL HIGH (ref 0.61–1.24)
GFR, Estimated: 21 mL/min — ABNORMAL LOW (ref >60)
Glucose, Bld: 163 mg/dL — ABNORMAL HIGH (ref 70–99)
Phosphorus: 2.9 mg/dL (ref 2.5–4.6)
Potassium: 4.5 mmol/L (ref 3.5–5.1)
Sodium: 140 mmol/L (ref 135–145)

## 2023-10-19 LAB — FERRITIN: Ferritin: 296 ng/mL (ref 24–336)

## 2023-10-19 MED ORDER — DARBEPOETIN ALFA 200 MCG/0.4ML IJ SOSY
200.0000 ug | PREFILLED_SYRINGE | Freq: Once | INTRAMUSCULAR | Status: AC
  Administered 2023-10-19: 200 ug via SUBCUTANEOUS

## 2023-10-19 MED ORDER — DARBEPOETIN ALFA 200 MCG/0.4ML IJ SOSY
PREFILLED_SYRINGE | INTRAMUSCULAR | Status: AC
  Filled 2023-10-19: qty 0.4

## 2023-10-19 NOTE — Telephone Encounter (Signed)
 Message was sent to Front office instead of the Refills (Clinical Nurse).  MEssage forwarded.  Copied from CRM [id number]. Topic: General - Other >> Oct 18, 2023  3:18 PM Brittney F wrote: Reason for CRM:   Patient is calling in to inform the office that he has switched his preferred pharmacy. Please update patient's file accordingly.  Preferred Pharmacy:   [facility] [phone] [address] [place], [place] [address] P: [phone]

## 2023-10-20 LAB — PTH, INTACT AND CALCIUM
Calcium, Total (PTH): 8.9 mg/dL (ref 8.7–10.2)
PTH: 138 pg/mL — ABNORMAL HIGH (ref 15–65)

## 2023-10-25 ENCOUNTER — Other Ambulatory Visit: Payer: Self-pay | Admitting: *Deleted

## 2023-10-25 ENCOUNTER — Other Ambulatory Visit: Payer: Self-pay | Admitting: Student

## 2023-10-25 DIAGNOSIS — I5032 Chronic diastolic (congestive) heart failure: Secondary | ICD-10-CM

## 2023-10-25 DIAGNOSIS — E119 Type 2 diabetes mellitus without complications: Secondary | ICD-10-CM

## 2023-10-25 DIAGNOSIS — E785 Hyperlipidemia, unspecified: Secondary | ICD-10-CM

## 2023-10-25 MED ORDER — GABAPENTIN 100 MG PO CAPS: ORAL_CAPSULE | ORAL | 0 refills | Status: DC

## 2023-10-25 NOTE — Telephone Encounter (Unsigned)
 Copied from CRM [id number]. Topic: Clinical - Medication Refill >> Oct 25, 2023  9:37 AM Cherylann RAMAN wrote: Medication: gabapentin  (NEURONTIN ) 100 MG capsule  Has the patient contacted their pharmacy? No (Agent: If no, request that the patient contact the pharmacy for the refill. If patient does not wish to contact the pharmacy document the reason why and proceed with request.) (Agent: If yes, when and what did the pharmacy advise?)  This is the patient's preferred pharmacy:  [facility] [phone] - [place], [place] - [address] AT [facility] [address] [place] [address] Phone: [phone] Fax: [phone]  Is this the correct pharmacy for this prescription? Yes If no, delete pharmacy and type the correct one.   Has the prescription been filled recently? No  Is the patient out of the medication? No  Has the patient been seen for an appointment in the last year OR does the patient have an upcoming appointment? Yes  Can we respond through MyChart? No  Agent: Please be advised that Rx refills may take up to 3 business days. We ask that you follow-up with your pharmacy.

## 2023-10-25 NOTE — Telephone Encounter (Signed)
 Copied from CRM [id number]. Topic: Clinical - Prescription Issue >> Oct 25, 2023  9:30 AM Cherylann RAMAN wrote: Reason for CRM: Patient is requesting that all his medication be sent over to the [facility] on [address]. Patient is missing the Eloquis, Amlodipine , Gabapentin , Lovastatin , Lidocaine , Novolog  FlexPen, Tamsulosin , Clonidine , Carvedilol , Furosemide , and Klor-Con .   [facility] [phone] - [place], [place] - [address] AT [facility] OF [address] [place] [address] Phone: [phone] Fax: [phone] Hours: Not open 24 hours

## 2023-10-26 ENCOUNTER — Telehealth: Payer: Self-pay | Admitting: *Deleted

## 2023-10-26 ENCOUNTER — Other Ambulatory Visit: Payer: Self-pay | Admitting: Student

## 2023-10-26 ENCOUNTER — Telehealth: Payer: Self-pay | Admitting: Dietician

## 2023-10-26 ENCOUNTER — Other Ambulatory Visit: Payer: Self-pay

## 2023-10-26 DIAGNOSIS — E1165 Type 2 diabetes mellitus with hyperglycemia: Secondary | ICD-10-CM

## 2023-10-26 DIAGNOSIS — E785 Hyperlipidemia, unspecified: Secondary | ICD-10-CM

## 2023-10-26 MED ORDER — GVOKE HYPOPEN 2-PACK 1 MG/0.2ML ~~LOC~~ SOAJ: 1.0000 mg | SUBCUTANEOUS | Status: DC | PRN

## 2023-10-26 MED ORDER — INSULIN PEN NEEDLE 32G X 4 MM MISC: 3 refills | Status: AC

## 2023-10-26 MED ORDER — GVOKE HYPOPEN 2-PACK 1 MG/0.2ML ~~LOC~~ SOAJ: 1.0000 mg | SUBCUTANEOUS | 2 refills | Status: AC | PRN

## 2023-10-26 NOTE — Telephone Encounter (Unsigned)
 Copied from CRM [id number]. Topic: Clinical - Medication Refill >> Oct 26, 2023 11:47 AM Alfonso ORN wrote: Medication: lovastatin  (MEVACOR ) 20 MG tablet  Has the patient contacted their pharmacy? Yes (Agent: If no, request that the patient contact the pharmacy for the refill. If patient does not wish to contact the pharmacy document the reason why and proceed with request.) (Agent: If yes, when and what did the pharmacy advise?)  This is the patient's preferred pharmacy:  [facility] [phone] - [place], [place] - [address] AT [facility] OF [address] [place] [address] Phone: [phone] Fax: [phone]  Is this the correct pharmacy for this prescription? Yes If no, delete pharmacy and type the correct one.   Has the prescription been filled recently? No  Is the patient out of the medication? Yes  Has the patient been seen for an appointment in the last year OR does the patient have an upcoming appointment? Yes  Can we respond through MyChart? Yes  Agent: Please be advised that Rx refills may take up to 3 business days. We ask that you follow-up with your pharmacy.

## 2023-10-26 NOTE — Telephone Encounter (Signed)
 Patient called asking that his prescription for Novolog  Flex pens, Pen needles and G-voke be sent to [facility] on [address]. Will forward to RN.  Leita Constable, RD, LDN, CDCES, DipACLM

## 2023-10-26 NOTE — Telephone Encounter (Signed)
 Will forward to PCP.                             Copied from CRM [id number]. Topic: Clinical - Prescription Issue >> Oct 26, 2023 11:50 AM Alfonso ORN wrote: Reason for CRM: ELIQUIS  5 MG TABS tablet,tamsulosin  (FLOMAX ) 0.4 MG CAPS capsule,carvedilol  (COREG ) 25 MG tablet,diclofenac  Sodium (VOLTAREN ) 1 % GEL,fluticasone  (FLONASE ) 50 MCG/ACT nasal spray,Insulin  Pen Needle 32G X 4 MM MISC,lidocaine  (LIDODERM ) 5 %,KLOR-CON  M20 20 MEQ tablet  Patient requesting to send the list of these medication to pharmacy so can have on file , the pharmacy has the rest  [id number] [place], [place] - [address] AT [facility] OF [address] [place] [address] Phone: [phone] Fax: [phone] Hours: Not open 24 hours

## 2023-10-27 ENCOUNTER — Other Ambulatory Visit: Payer: Self-pay | Admitting: Student

## 2023-10-27 DIAGNOSIS — E119 Type 2 diabetes mellitus without complications: Secondary | ICD-10-CM

## 2023-10-29 MED ORDER — LOVASTATIN 20 MG PO TABS: 20.0000 mg | ORAL_TABLET | Freq: Every day | ORAL | 0 refills | Status: AC

## 2023-10-29 NOTE — Telephone Encounter (Signed)
 Pharmacy requesting additional refills to place on file.

## 2023-11-01 MED ORDER — GABAPENTIN 100 MG PO CAPS
ORAL_CAPSULE | ORAL | 0 refills | Status: AC
Start: 2023-11-01 — End: ?

## 2023-11-01 MED ORDER — TAMSULOSIN HCL 0.4 MG PO CAPS: 0.4000 mg | ORAL_CAPSULE | Freq: Every day | ORAL | 0 refills | Status: DC

## 2023-11-01 MED ORDER — APIXABAN 5 MG PO TABS: 5.0000 mg | ORAL_TABLET | Freq: Two times a day (BID) | ORAL | 0 refills | Status: DC

## 2023-11-01 MED ORDER — CARVEDILOL 25 MG PO TABS: 25.0000 mg | ORAL_TABLET | Freq: Two times a day (BID) | ORAL | 0 refills | Status: DC

## 2023-11-01 MED ORDER — AMLODIPINE BESYLATE 10 MG PO TABS: 10.0000 mg | ORAL_TABLET | Freq: Every day | ORAL | 3 refills | Status: AC

## 2023-11-01 MED ORDER — KLOR-CON M20 20 MEQ PO TBCR: 20.0000 meq | EXTENDED_RELEASE_TABLET | Freq: Three times a day (TID) | ORAL | 2 refills | Status: AC

## 2023-11-01 MED ORDER — FUROSEMIDE 20 MG PO TABS: 20.0000 mg | ORAL_TABLET | Freq: Every day | ORAL | 1 refills | Status: AC

## 2023-11-01 MED ORDER — LIDOCAINE 5 % EX PTCH: 1.0000 | MEDICATED_PATCH | CUTANEOUS | 0 refills | Status: AC

## 2023-11-01 MED ORDER — LOVASTATIN 20 MG PO TABS: 20.0000 mg | ORAL_TABLET | Freq: Every day | ORAL | 0 refills | Status: AC

## 2023-11-01 MED ORDER — CLONIDINE 0.1 MG/24HR TD PTWK: 0.1000 mg | MEDICATED_PATCH | TRANSDERMAL | 0 refills | Status: DC

## 2023-11-01 MED ORDER — NOVOLOG FLEXPEN 100 UNIT/ML ~~LOC~~ SOPN: 0.0000 [IU] | PEN_INJECTOR | Freq: Three times a day (TID) | SUBCUTANEOUS | 4 refills | Status: AC

## 2023-11-02 ENCOUNTER — Encounter (HOSPITAL_COMMUNITY)
Admission: RE | Admit: 2023-11-02 | Discharge: 2023-11-02 | Disposition: A | Discharge status: Home / Self Care | Source: Ambulatory Visit | Attending: Nephrology | Admitting: Nephrology

## 2023-11-02 VITALS — BP 156/71 | HR 89 | Temp 97.3°F | Resp 16

## 2023-11-02 DIAGNOSIS — N185 Chronic kidney disease, stage 5: Secondary | ICD-10-CM | POA: Diagnosis present

## 2023-11-02 DIAGNOSIS — D631 Anemia in chronic kidney disease: Secondary | ICD-10-CM | POA: Insufficient documentation

## 2023-11-02 LAB — RENAL FUNCTION PANEL
Albumin: 3.3 g/dL — ABNORMAL LOW (ref 3.5–5.0)
Anion gap: 13 (ref 5–15)
BUN: 68 mg/dL — ABNORMAL HIGH (ref 6–20)
CO2: 19 mmol/L — ABNORMAL LOW (ref 22–32)
Calcium: 9 mg/dL (ref 8.9–10.3)
Chloride: 101 mmol/L (ref 98–111)
Creatinine, Ser: 3.2 mg/dL — ABNORMAL HIGH (ref 0.61–1.24)
GFR, Estimated: 23 mL/min — ABNORMAL LOW (ref >60)
Glucose, Bld: 166 mg/dL — ABNORMAL HIGH (ref 70–99)
Phosphorus: 2.9 mg/dL (ref 2.5–4.6)
Potassium: 4.1 mmol/L (ref 3.5–5.1)
Sodium: 133 mmol/L — ABNORMAL LOW (ref 135–145)

## 2023-11-02 LAB — FERRITIN: Ferritin: 278 ng/mL (ref 24–336)

## 2023-11-02 LAB — IRON AND TIBC
Iron: 71 ug/dL (ref 45–182)
Saturation Ratios: 25 % (ref 17.9–39.5)
TIBC: 284 ug/dL (ref 250–450)
UIBC: 213 ug/dL

## 2023-11-02 LAB — POCT HEMOGLOBIN-HEMACUE: Hemoglobin: 11.5 g/dL — ABNORMAL LOW (ref 13.0–17.0)

## 2023-11-02 MED ORDER — DARBEPOETIN ALFA 200 MCG/0.4ML IJ SOSY
PREFILLED_SYRINGE | INTRAMUSCULAR | Status: AC
  Filled 2023-11-02: qty 0.4

## 2023-11-02 MED ORDER — DARBEPOETIN ALFA 200 MCG/0.4ML IJ SOSY
200.0000 ug | PREFILLED_SYRINGE | Freq: Once | INTRAMUSCULAR | Status: AC
  Administered 2023-11-02: 200 ug via SUBCUTANEOUS

## 2023-11-05 ENCOUNTER — Telehealth: Payer: Self-pay

## 2023-11-05 NOTE — Telephone Encounter (Signed)
 [name] ([name]) PA Case ID #: [id number] Need Help? Call us  at [phone] Outcome Approved today by [employer] 2017 NCPDP Request Reference Number: [id number]. NOVOLOG  INJ FLEXPEN is approved through 01/08/2025. Your patient may now fill this prescription and it will be covered. Effective Date: 11/05/2023 Authorization Expiration Date: 01/08/2025 Drug NovoLOG  FlexPen 100UNIT/ML pen-injectors ePA cloud logo Form [employer] Medicare Part D Electronic Prior Authorization Form (2017 NCPDP)  Attempted to call the patient x2 to let him know that the pa has been approved. Call cannot be completed unable to reach the patient or lvm.

## 2023-11-05 NOTE — Telephone Encounter (Signed)
 Prior Authorization for patient (NovoLOG  FlexPen 100UNIT/ML pen-injectors) came through on cover my meds was submitted with last office notes and labs awaiting approval or denial.  [id number]

## 2023-11-06 ENCOUNTER — Other Ambulatory Visit: Payer: Self-pay | Admitting: Endocrinology

## 2023-11-06 DIAGNOSIS — E113212 Type 2 diabetes mellitus with mild nonproliferative diabetic retinopathy with macular edema, left eye: Secondary | ICD-10-CM | POA: Diagnosis not present

## 2023-11-06 DIAGNOSIS — E119 Type 2 diabetes mellitus without complications: Secondary | ICD-10-CM

## 2023-11-07 ENCOUNTER — Other Ambulatory Visit: Payer: Self-pay | Admitting: Student

## 2023-11-07 NOTE — Telephone Encounter (Signed)
 Pharmacy requesting additional refills to place on file.

## 2023-11-09 ENCOUNTER — Telehealth: Payer: Self-pay | Admitting: Student

## 2023-11-09 NOTE — Telephone Encounter (Signed)
 Message has been forwarded to [facility], but she is off on Fridays, so also including the triage nurse.  Copied from CRM [id number]. Topic: General - Other >> Nov 08, 2023  3:47 PM Farrel B wrote: Reason for CRM: [phone] [name] is calling to speak to [name] Please call patient at your earliest convenience

## 2023-11-09 NOTE — Telephone Encounter (Signed)
 Pt stated he's during a podcast on diabetes and wants Arland to accompanied him. Stated Arland can call him on Monday.

## 2023-11-12 ENCOUNTER — Encounter: Payer: Self-pay | Admitting: Radiology

## 2023-11-12 NOTE — Telephone Encounter (Signed)
 Call to patient, he would like a diabetes educator to be there at this national diabetes awareness month podcast to answer any technical questions about diabetes on Sunday, Nov 16th, live,  called [employer] . He will get back to [facility] with time and place for podcast recording. Tentatively agreed.

## 2023-11-16 ENCOUNTER — Encounter (HOSPITAL_COMMUNITY)
Admission: RE | Admit: 2023-11-16 | Discharge: 2023-11-16 | Disposition: A | Discharge status: Home / Self Care | Source: Ambulatory Visit | Attending: Nephrology | Admitting: Nephrology

## 2023-11-16 ENCOUNTER — Telehealth: Payer: Self-pay

## 2023-11-16 ENCOUNTER — Other Ambulatory Visit: Payer: Self-pay

## 2023-11-16 VITALS — BP 139/72 | HR 88 | Temp 97.7°F | Resp 18

## 2023-11-16 DIAGNOSIS — N185 Chronic kidney disease, stage 5: Secondary | ICD-10-CM | POA: Insufficient documentation

## 2023-11-16 DIAGNOSIS — D631 Anemia in chronic kidney disease: Secondary | ICD-10-CM | POA: Diagnosis present

## 2023-11-16 DIAGNOSIS — E119 Type 2 diabetes mellitus without complications: Secondary | ICD-10-CM

## 2023-11-16 LAB — FERRITIN: Ferritin: 170 ng/mL (ref 24–336)

## 2023-11-16 LAB — RENAL FUNCTION PANEL
Albumin: 3.4 g/dL — ABNORMAL LOW (ref 3.5–5.0)
Anion gap: 12 (ref 5–15)
BUN: 57 mg/dL — ABNORMAL HIGH (ref 6–20)
CO2: 22 mmol/L (ref 22–32)
Calcium: 8.7 mg/dL — ABNORMAL LOW (ref 8.9–10.3)
Chloride: 104 mmol/L (ref 98–111)
Creatinine, Ser: 3.33 mg/dL — ABNORMAL HIGH (ref 0.61–1.24)
GFR, Estimated: 22 mL/min — ABNORMAL LOW (ref >60)
Glucose, Bld: 142 mg/dL — ABNORMAL HIGH (ref 70–99)
Phosphorus: 4 mg/dL (ref 2.5–4.6)
Potassium: 4.7 mmol/L (ref 3.5–5.1)
Sodium: 138 mmol/L (ref 135–145)

## 2023-11-16 LAB — POCT HEMOGLOBIN-HEMACUE: Hemoglobin: 11.2 g/dL — ABNORMAL LOW (ref 13.0–17.0)

## 2023-11-16 LAB — IRON AND TIBC
Iron: 44 ug/dL — ABNORMAL LOW (ref 45–182)
Saturation Ratios: 15 % — ABNORMAL LOW (ref 17.9–39.5)
TIBC: 300 ug/dL (ref 250–450)
UIBC: 256 ug/dL

## 2023-11-16 MED ORDER — DARBEPOETIN ALFA 200 MCG/0.4ML IJ SOSY
PREFILLED_SYRINGE | INTRAMUSCULAR | Status: AC
  Filled 2023-11-16: qty 0.4

## 2023-11-16 MED ORDER — DARBEPOETIN ALFA 200 MCG/0.4ML IJ SOSY
200.0000 ug | PREFILLED_SYRINGE | Freq: Once | INTRAMUSCULAR | Status: AC
  Administered 2023-11-16: 200 ug via SUBCUTANEOUS

## 2023-11-16 MED ORDER — INSULIN GLARGINE 100 UNIT/ML SOLOSTAR PEN: PEN_INJECTOR | SUBCUTANEOUS | 3 refills | Status: DC

## 2023-11-16 NOTE — Telephone Encounter (Signed)
 Yes, okay to change based on insurance coverage on same dose for this patient and also in future.

## 2023-11-16 NOTE — Telephone Encounter (Signed)
 Alternative requested for patient's Tresiba  to Toujeo  or Lantus  for insurance purposes

## 2023-11-21 ENCOUNTER — Telehealth: Payer: Self-pay | Admitting: Student

## 2023-11-21 NOTE — Telephone Encounter (Signed)
 Message has been forwarded to [facility] to make contact with the patient.  Copied from CRM [id number]. Topic: General - Other >> Nov 20, 2023  3:14 PM Graeme ORN wrote: Reason for CRM: Patient called. Request to speak to [facility]. Let her know the [employer] - arrive at [employer]. He has spoken with her previously about this. Thank You

## 2023-11-21 NOTE — Telephone Encounter (Signed)
 Spoke with patient.

## 2023-11-30 ENCOUNTER — Ambulatory Visit (HOSPITAL_COMMUNITY)
Admission: RE | Admit: 2023-11-30 | Discharge: 2023-11-30 | Disposition: A | Discharge status: Home / Self Care | Source: Ambulatory Visit | Attending: Nephrology | Admitting: Nephrology

## 2023-11-30 ENCOUNTER — Other Ambulatory Visit (INDEPENDENT_AMBULATORY_CARE_PROVIDER_SITE_OTHER): Payer: Self-pay

## 2023-11-30 ENCOUNTER — Encounter: Payer: Self-pay | Admitting: Physician Assistant

## 2023-11-30 ENCOUNTER — Ambulatory Visit (INDEPENDENT_AMBULATORY_CARE_PROVIDER_SITE_OTHER): Admitting: Physician Assistant

## 2023-11-30 VITALS — BP 145/79 | HR 95 | Temp 98.3°F | Resp 17

## 2023-11-30 DIAGNOSIS — D631 Anemia in chronic kidney disease: Secondary | ICD-10-CM | POA: Insufficient documentation

## 2023-11-30 DIAGNOSIS — M79605 Pain in left leg: Secondary | ICD-10-CM | POA: Diagnosis not present

## 2023-11-30 DIAGNOSIS — N185 Chronic kidney disease, stage 5: Secondary | ICD-10-CM | POA: Insufficient documentation

## 2023-11-30 LAB — RENAL FUNCTION PANEL
Albumin: 3.6 g/dL (ref 3.5–5.0)
Anion gap: 14 (ref 5–15)
BUN: 67 mg/dL — ABNORMAL HIGH (ref 6–20)
CO2: 21 mmol/L — ABNORMAL LOW (ref 22–32)
Calcium: 9.3 mg/dL (ref 8.9–10.3)
Chloride: 102 mmol/L (ref 98–111)
Creatinine, Ser: 3.48 mg/dL — ABNORMAL HIGH (ref 0.61–1.24)
GFR, Estimated: 21 mL/min — ABNORMAL LOW (ref >60)
Glucose, Bld: 125 mg/dL — ABNORMAL HIGH (ref 70–99)
Phosphorus: 3.8 mg/dL (ref 2.5–4.6)
Potassium: 4 mmol/L (ref 3.5–5.1)
Sodium: 137 mmol/L (ref 135–145)

## 2023-11-30 LAB — IRON AND TIBC
Iron: 28 ug/dL — ABNORMAL LOW (ref 45–182)
Saturation Ratios: 9 % — ABNORMAL LOW (ref 17.9–39.5)
TIBC: 307 ug/dL (ref 250–450)
UIBC: 279 ug/dL

## 2023-11-30 LAB — POCT HEMOGLOBIN-HEMACUE: Hemoglobin: 12.5 g/dL — ABNORMAL LOW (ref 13.0–17.0)

## 2023-11-30 LAB — FERRITIN: Ferritin: 190 ng/mL (ref 24–336)

## 2023-11-30 MED ORDER — METHYLPREDNISOLONE 4 MG PO TBPK: ORAL_TABLET | ORAL | 0 refills | Status: AC

## 2023-11-30 MED ORDER — METHOCARBAMOL 500 MG PO TABS: 500.0000 mg | ORAL_TABLET | Freq: Four times a day (QID) | ORAL | 0 refills | Status: AC | PRN

## 2023-11-30 MED ORDER — DARBEPOETIN ALFA 200 MCG/0.4ML IJ SOSY: 200.0000 ug | PREFILLED_SYRINGE | Freq: Once | INTRAMUSCULAR | Status: DC

## 2023-11-30 MED ORDER — HYDROCODONE-ACETAMINOPHEN 5-325 MG PO TABS
1.0000 | ORAL_TABLET | Freq: Four times a day (QID) | ORAL | 0 refills | Status: AC | PRN
Start: 2023-11-30 — End: ?

## 2023-11-30 MED ORDER — DARBEPOETIN ALFA 200 MCG/0.4ML IJ SOSY
PREFILLED_SYRINGE | INTRAMUSCULAR | Status: AC
Start: 2023-11-30 — End: 2023-11-30
  Filled 2023-11-30: qty 0.4

## 2023-11-30 NOTE — Progress Notes (Signed)
 Office Visit Note   Patient: [name]           Date of Birth: August 10, 1975           MRN: [id number] Visit Date: 11/30/2023              Requested by: Renne Homans, MD [address] [place], [address] [place],  [place] [address] PCP: Renne Homans, MD   Assessment & Plan: Visit Diagnoses:  1. Pain in left leg     Plan: Patient is a 48 year old gentleman comes in today with a chief complaint of low back buttock pain radiating down his left leg.  This happened after he had been doing some weight training on Monday deadlifts.  He is status post amputation on the right of his lower leg.  Since then he has not worn his prosthetic on the right and has had significant pain going down the left buttock down the lateral side of the leg.  His strength in his left lower extremity is actually fairly good.  He is in significant pain.  I recommend a Medrol  Dosepak.  He is a diabetic but has had good glycemic control and wears a continuous monitor.  I told him if he notices any spikes in his blood sugar to obviously discontinue the steroid.  Will also give him a few pain pills and muscle relaxant.  I have also placed an order for physical therapy there are no red flags no loss of bowel or b bladder control will follow-up in 3 weeks should go to the emergency room if symptoms progress  Follow-Up Instructions: No follow-ups on file.   Orders:  Orders Placed This Encounter  Procedures   XR Lumbar Spine 2-3 Views   No orders of the defined types were placed in this encounter.     Procedures: No procedures performed   Clinical Data: No additional findings.   Subjective: Chief Complaint  Patient presents with   Lower Back - Pain    HPI 48 year old gentleman comes in today with a 4-day history of left lower buttock and back pain.  Was doing dead lifts at the gym he is status post lower leg amputation on the right.  Denies any change in bowel function or bladder function  Review of Systems   All other systems reviewed and are negative.    Objective: Vital Signs: There were no vitals taken for this visit.  Physical Exam Constitutional:      Appearance: Normal appearance.  Pulmonary:     Effort: Pulmonary effort is normal.  Skin:    General: Skin is warm and dry.  Neurological:     General: No focal deficit present.     Mental Status: He is alert and oriented to person, place, and time.  Psychiatric:        Mood and Affect: Mood normal.        Behavior: Behavior normal.     Ortho Exam Examination no step-offs no redness no erythema.  He does have a straight leg raise on the left.  Sensation is intact all the way to his foot.  His strength is 5 out of 5 in the left lower extremity Specialty Comments:  No specialty comments available.  Imaging: No results found.   PMFS History: Patient Active Problem List   Diagnosis Date Noted   Erythropoietin-resistant anemia 09/18/2023   BPH (benign prostatic hyperplasia) 08/27/2023   Swelling of left index finger 07/19/2023   Chronic gout 07/19/2023   Tendinosis  of right shoulder, possible supraspinatus tear 05/08/2023   Gout of right wrist 03/26/2023   Insomnia 03/26/2023   Volume overload 03/21/2023   Inflammatory arthropathy 02/23/2023   Medial epicondylitis of elbow, left 05/06/2022   Diabetic neuropathy (HCC) 01/24/2022   Benign paroxysmal positional vertigo 09/14/2020   Anemia associated with chronic renal failure    Chronic kidney disease, stage V (HCC)    Hemoglobinuria 12/12/2018   OSA (obstructive sleep apnea) 02/28/2018   Acute on chronic diastolic heart failure (HCC) 02/02/2018   Preventative health care 08/15/2017   Erectile dysfunction 04/22/2017   History of pulmonary embolus (PE) 06/09/2016   Depression 05/04/2016   Hypogonadism in male 05/04/2016   Urinary retention 05/04/2016   Dyslipidemia 08/12/2014   Unilateral AKA, right (HCC) 12/26/2013   Hyperlipidemia 02/19/2013   S/P cardiac cath,  02/18/13, normal coronaries 02/19/2013   CKD (chronic kidney disease) stage 4, GFR 15-29 ml/min (HCC) 02/19/2013   Hypertension 07/24/2011   Long term current use of anticoagulant therapy 07/19/2011   History of DVT (deep vein thrombosis) 07/28/2009   Morbid obesity (HCC) 10/26/2005   Insulin  dependent type 2 diabetes mellitus, controlled (HCC) 01/09/2002   Past Medical History:  Diagnosis Date   Acute venous embolism and thrombosis of deep vessels of proximal lower extremity (HCC) 07/19/2011   Anemia    Anginal pain    pt denies   Anxiety    Chest pain, neg MI, normal coronaries by cath 02/18/2013   pt denies   CHF (congestive heart failure) (HCC)    Chronic renal disease, stage IV (HCC) 02/19/2013   Colon polyps    Depression    Diabetic ulcer of right foot (HCC)    DVT (deep venous thrombosis) (HCC) 09/2002   patient reports additional DVTs in '06 & '11 (unconfirmed)   ED (erectile dysfunction)    GERD (gastroesophageal reflux disease)    History of blood transfusion    related to OR (10/31/2016)   Hyperlipidemia 02/19/2013   Hypertension    Nausea & vomiting    constant for the last couple weeks (10/31/2016)   Nephrotic syndrome    Obesity    BMI 44, weight 346 pounds 01/30/14   OSA (obstructive sleep apnea) 02/28/2018   Mild obstructive sleep apnea with an AHI of 9.8/h but severe during REM sleep with an AHI of 33.8/h.  Oxygen  saturations dropped to 86% and there was moderate snoring   Peripheral edema    Pneumonia    Prosthesis adjustment 08/17/2016   Pulmonary embolism (HCC) 09/2002   treated with 6 months of warfarin   Pyelonephritis 02/02/2018   Type I diabetes mellitus (HCC) dx'd 2001   Urinary retention     Family History  Problem Relation Age of Onset   Heart disease Mother    Diabetes Mother    Diabetes Maternal Uncle    Diabetes Cousin     Past Surgical History:  Procedure Laterality Date   AMPUTATION Right 12/22/2013   Procedure: AMPUTATION  BELOW KNEE;  Surgeon: Norleen Armor, MD;  Location: [facility] OR;  Service: Orthopedics;  Laterality: Right;   AMPUTATION Right 02/19/2014   Procedure: RIGHT ABOVE KNEE AMPUTATION ;  Surgeon: Norleen Armor, MD;  Location: [facility] OR;  Service: Orthopedics;  Laterality: Right;   blood clot removal  2016   CARDIAC CATHETERIZATION  02/18/2013   normal coronaries   ESOPHAGOGASTRODUODENOSCOPY N/A 11/02/2016   Procedure: ESOPHAGOGASTRODUODENOSCOPY (EGD);  Surgeon: Avram Lupita BRAVO, MD;  Location: [facility] ENDOSCOPY;  Service: Endoscopy;  Laterality: N/A;   I & D EXTREMITY Right 12/14/2013   Procedure: IRRIGATION AND DEBRIDEMENT RIGHT FOOT;  Surgeon: Elspeth JONELLE Her, MD;  Location: [facility] OR;  Service: Orthopedics;  Laterality: Right;   INCISION AND DRAINAGE ABSCESS  2007; 2015   back   INCISION AND DRAINAGE OF WOUND Right 12/22/2013   Procedure: I&D RIGHT BUTTOCK;  Surgeon: Norleen Armor, MD;  Location: [facility] OR;  Service: Orthopedics;  Laterality: Right;   LEFT HEART CATHETERIZATION WITH CORONARY ANGIOGRAM N/A 02/18/2013   Procedure: LEFT HEART CATHETERIZATION WITH CORONARY ANGIOGRAM;  Surgeon: Peter M Jordan, MD;  Location: [facility] CATH LAB;  Service: Cardiovascular;  Laterality: N/A;   Social History   Occupational History   Occupation: student    Comment: [employer]   Occupation: disability  Tobacco Use   Smoking status: Never   Smokeless tobacco: Never  Vaping Use   Vaping status: Never Used  Substance and Sexual Activity   Alcohol use: Yes    Comment: 10/31/2016 a few beers q 6 months or so   Drug use: No   Sexual activity: Yes

## 2023-12-11 NOTE — Therapy (Incomplete)
 OUTPATIENT PHYSICAL THERAPY LOWER EXTREMITY EVALUATION   Patient Name: [name] MRN: [id number] DOB:[birth year], 48 y.o., male Today's Date: 12/11/2023  END OF SESSION:   Past Medical History:  Diagnosis Date   Acute venous embolism and thrombosis of deep vessels of proximal lower extremity (HCC) 07/19/2011   Anemia    Anginal pain    pt denies   Anxiety    Chest pain, neg MI, normal coronaries by cath 02/18/2013   pt denies   CHF (congestive heart failure) (HCC)    Chronic renal disease, stage IV (HCC) 02/19/2013   Colon polyps    Depression    Diabetic ulcer of right foot (HCC)    DVT (deep venous thrombosis) (HCC) 09/2002   patient reports additional DVTs in '06 & '11 (unconfirmed)   ED (erectile dysfunction)    GERD (gastroesophageal reflux disease)    History of blood transfusion    related to OR (10/31/2016)   Hyperlipidemia 02/19/2013   Hypertension    Nausea & vomiting    constant for the last couple weeks (10/31/2016)   Nephrotic syndrome    Obesity    BMI 44, weight 346 pounds 01/30/14   OSA (obstructive sleep apnea) 02/28/2018   Mild obstructive sleep apnea with an AHI of 9.8/h but severe during REM sleep with an AHI of 33.8/h.  Oxygen  saturations dropped to 86% and there was moderate snoring   Peripheral edema    Pneumonia    Prosthesis adjustment 08/17/2016   Pulmonary embolism (HCC) 09/2002   treated with 6 months of warfarin   Pyelonephritis 02/02/2018   Type I diabetes mellitus (HCC) dx'd 2001   Urinary retention    Past Surgical History:  Procedure Laterality Date   AMPUTATION Right 12/22/2013   Procedure: AMPUTATION BELOW KNEE;  Surgeon: Norleen Armor, MD;  Location: [facility] OR;  Service: Orthopedics;  Laterality: Right;   AMPUTATION Right 02/19/2014   Procedure: RIGHT ABOVE KNEE AMPUTATION ;  Surgeon: Norleen Armor, MD;  Location: [facility] OR;  Service: Orthopedics;  Laterality: Right;   blood clot removal  2016   CARDIAC CATHETERIZATION  02/18/2013    normal coronaries   ESOPHAGOGASTRODUODENOSCOPY N/A 11/02/2016   Procedure: ESOPHAGOGASTRODUODENOSCOPY (EGD);  Surgeon: Avram Lupita BRAVO, MD;  Location[facility] ENDOSCOPY;  Service: Endoscopy;  Laterality: N/A;   I & D EXTREMITY Right 12/14/2013   Procedure: IRRIGATION AND DEBRIDEMENT RIGHT FOOT;  Surgeon: Elspeth JONELLE Her, MD;  Location: [facility] OR;  Service: Orthopedics;  Laterality: Right;   INCISION AND DRAINAGE ABSCESS  2007; 2015   back   INCISION AND DRAINAGE OF WOUND Right 12/22/2013   Procedure: I&D RIGHT BUTTOCK;  Surgeon: Norleen Armor, MD;  Location: [facility] OR;  Service: Orthopedics;  Laterality: Right;   LEFT HEART CATHETERIZATION WITH CORONARY ANGIOGRAM N/A 02/18/2013   Procedure: LEFT HEART CATHETERIZATION WITH CORONARY ANGIOGRAM;  Surgeon: Peter M Jordan, MD;  Location: [facility] CATH LAB;  Service: Cardiovascular;  Laterality: N/A;   Patient Active Problem List   Diagnosis Date Noted   Erythropoietin-resistant anemia 09/18/2023   BPH (benign prostatic hyperplasia) 08/27/2023   Swelling of left index finger 07/19/2023   Chronic gout 07/19/2023   Tendinosis of right shoulder, possible supraspinatus tear 05/08/2023   Gout of right wrist 03/26/2023   Insomnia 03/26/2023   Volume overload 03/21/2023   Inflammatory arthropathy 02/23/2023   Medial epicondylitis of elbow, left 05/06/2022   Diabetic neuropathy (HCC) 01/24/2022   Benign paroxysmal positional vertigo 09/14/2020   Anemia associated with chronic renal failure  Chronic kidney disease, stage V (HCC)    Hemoglobinuria 12/12/2018   OSA (obstructive sleep apnea) 02/28/2018   Acute on chronic diastolic heart failure (HCC) 02/02/2018   Preventative health care 08/15/2017   Erectile dysfunction 04/22/2017   History of pulmonary embolus (PE) 06/09/2016   Depression 05/04/2016   Hypogonadism in male 05/04/2016   Urinary retention 05/04/2016   Dyslipidemia 08/12/2014   Unilateral AKA, right (HCC) 12/26/2013   Hyperlipidemia 02/19/2013   S/P  cardiac cath, 02/18/13, normal coronaries 02/19/2013   CKD (chronic kidney disease) stage 4, GFR 15-29 ml/min (HCC) 02/19/2013   Hypertension 07/24/2011   Long term current use of anticoagulant therapy 07/19/2011   History of DVT (deep vein thrombosis) 07/28/2009   Morbid obesity (HCC) 10/26/2005   Insulin  dependent type 2 diabetes mellitus, controlled (HCC) 01/09/2002    PCP: Renne Homans, MD   REFERRING PROVIDER: Persons, Ronal Dragon, [place]   REFERRING DIAG: [phone] (ICD-10-CM) - Pain in left leg   THERAPY DIAG:  No diagnosis found.  Rationale for Evaluation and Treatment: Rehabilitation  ONSET DATE: ***  SUBJECTIVE:   SUBJECTIVE STATEMENT: ***  PERTINENT HISTORY: *** 11/30/23 PN from [facility]: Plan: Patient is a 48 year old gentleman comes in today with a chief complaint of low back buttock pain radiating down his left leg.  This happened after he had been doing some weight training on Monday deadlifts.  He is status post amputation on the right of his lower leg.  Since then he has not worn his prosthetic on the right and has had significant pain going down the left buttock down the lateral side of the leg.  His strength in his left lower extremity is actually fairly good.  He is in significant pain.  I recommend a Medrol  Dosepak.  He is a diabetic but has had good glycemic control and wears a continuous monitor.  I told him if he notices any spikes in his blood sugar to obviously discontinue the steroid.  Will also give him a few pain pills and muscle relaxant.  I have also placed an order for physical therapy there are no red flags no loss of bowel or b bladder control will follow-up in 3 weeks should go to the emergency room if symptoms progress   PAIN:  Are you having pain? Yes: NPRS scale: *** Pain location: *** Pain description: *** Aggravating factors: *** Relieving factors: ***  PRECAUTIONS: {Therapy precautions:24002}  RED FLAGS: {PT Red  Flags:29287}   WEIGHT BEARING RESTRICTIONS: {Yes ***/No:24003}  FALLS:  Has patient fallen in last 6 months? {fallsyesno:27318}  LIVING ENVIRONMENT: Lives with: {[facility] lives with:25569::lives with their family} Lives in: {Lives in:25570} Stairs: {opstairs:27293} Has following equipment at home: {Assistive devices:23999}  OCCUPATION: ***  PLOF: {PLOF:24004}  PATIENT GOALS: ***  NEXT MD VISIT: ***  OBJECTIVE:  Note: Objective measures were completed at Evaluation unless otherwise noted.  DIAGNOSTIC FINDINGS:  11/30/23 Radiographs difficult to assess because of patient's habitus and bowel  patient also was unable to stand because he is a amputee.  Does not have  his prosthetic and has been able to wear it since his pain began cannot  appreciate any acute fractures scoliosis noted could be secondary to  positioning   PATIENT SURVEYS:  {rehab surveys:24030}  COGNITION: Overall cognitive status: {cognition:24006}     SENSATION: {sensation:27233}  EDEMA:  {edema:24020}  MUSCLE LENGTH: Hamstrings: Right *** deg; Left *** deg Thomas test: Right *** deg; Left *** deg  POSTURE: {posture:25561}  PALPATION: ***  LOWER EXTREMITY ROM:  {AROM/PROM:27142} ROM Right eval Left eval  Hip flexion    Hip extension    Hip abduction    Hip adduction    Hip internal rotation    Hip external rotation    Knee flexion    Knee extension    Ankle dorsiflexion    Ankle plantarflexion    Ankle inversion    Ankle eversion     (Blank rows = not tested)  LOWER EXTREMITY MMT:  MMT Right eval Left eval  Hip flexion    Hip extension    Hip abduction    Hip adduction    Hip internal rotation    Hip external rotation    Knee flexion    Knee extension    Ankle dorsiflexion    Ankle plantarflexion    Ankle inversion    Ankle eversion     (Blank rows = not tested)  LOWER EXTREMITY SPECIAL TESTS:  {LEspecialtests:26242}  FUNCTIONAL TESTS:  {Functional  tests:24029}  GAIT: Distance walked: *** Assistive device utilized: {Assistive devices:23999} Level of assistance: {Levels of assistance:24026} Comments: ***                                                                                                                                TREATMENT DATE:  [facility] Adult PT Treatment:                                                DATE: *** Therapeutic Exercise: *** Manual Therapy: *** Neuromuscular re-ed: *** Therapeutic Activity: *** Modalities: *** Self Care: ***     PATIENT EDUCATION:  Education details: *** Person educated: {Person educated:25204} Education method: {Education Method:25205} Education comprehension: {Education Comprehension:25206}  HOME EXERCISE PROGRAM: ***  ASSESSMENT:  CLINICAL IMPRESSION: Patient is a 48 y.o. male who was seen today for physical therapy evaluation and treatment for M79.605 (ICD-10-CM) - Pain in left leg .   OBJECTIVE IMPAIRMENTS: {opptimpairments:25111}.   ACTIVITY LIMITATIONS: {activitylimitations:27494}  PARTICIPATION LIMITATIONS: {participationrestrictions:25113}  PERSONAL FACTORS: {Personal factors:25162} are also affecting patient's functional outcome.   REHAB POTENTIAL: {rehabpotential:25112}  CLINICAL DECISION MAKING: {clinical decision making:25114}  EVALUATION COMPLEXITY: {Evaluation complexity:25115}   GOALS:  SHORT TERM GOALS: Target date: *** *** Baseline: Goal status: INITIAL  2.  *** Baseline:  Goal status: INITIAL  3.  *** Baseline:  Goal status: INITIAL  4.  *** Baseline:  Goal status: INITIAL  5.  *** Baseline:  Goal status: INITIAL  6.  *** Baseline:  Goal status: INITIAL  LONG TERM GOALS: Target date: ***  *** Baseline:  Goal status: INITIAL  2.  *** Baseline:  Goal status: INITIAL  3.  *** Baseline:  Goal status: INITIAL  4.  *** Baseline:  Goal status: INITIAL  5.  *** Baseline:  Goal status: INITIAL  6.   *** Baseline:  Goal status: INITIAL   PLAN:  PT FREQUENCY: {rehab frequency:25116}  PT DURATION: {rehab duration:25117}  PLANNED INTERVENTIONS: {rehab planned interventions:25118::97110-Therapeutic exercises,97530- Therapeutic [phone]- Neuromuscular re-education,97535- Self Rjmz,02859- Manual therapy,Patient/Family education}  PLAN FOR NEXT SESSION: ***  Zephaniah Enyeart MS, PT 12/11/23 6:50 PM

## 2023-12-12 ENCOUNTER — Ambulatory Visit: Attending: Physician Assistant

## 2023-12-12 ENCOUNTER — Other Ambulatory Visit: Payer: Self-pay

## 2023-12-12 MED ORDER — ALLOPURINOL 100 MG PO TABS: 100.0000 mg | ORAL_TABLET | Freq: Every day | ORAL | 3 refills | Status: AC

## 2023-12-14 ENCOUNTER — Encounter (HOSPITAL_COMMUNITY)

## 2023-12-18 ENCOUNTER — Inpatient Hospital Stay (HOSPITAL_COMMUNITY): Admission: RE | Admit: 2023-12-18 | Discharge: 2023-12-18 | Attending: Nephrology

## 2023-12-18 VITALS — BP 123/75 | HR 85 | Temp 97.2°F | Resp 16

## 2023-12-18 DIAGNOSIS — D631 Anemia in chronic kidney disease: Secondary | ICD-10-CM

## 2023-12-18 DIAGNOSIS — N185 Chronic kidney disease, stage 5: Secondary | ICD-10-CM

## 2023-12-18 LAB — IRON AND TIBC
Iron: 55 ug/dL (ref 45–182)
Saturation Ratios: 19 % (ref 17.9–39.5)
TIBC: 287 ug/dL (ref 250–450)
UIBC: 232 ug/dL

## 2023-12-18 LAB — RENAL FUNCTION PANEL
Albumin: 3.3 g/dL — ABNORMAL LOW (ref 3.5–5.0)
Anion gap: 18 — ABNORMAL HIGH (ref 5–15)
BUN: 68 mg/dL — ABNORMAL HIGH (ref 6–20)
CO2: 16 mmol/L — ABNORMAL LOW (ref 22–32)
Calcium: 9 mg/dL (ref 8.9–10.3)
Chloride: 104 mmol/L (ref 98–111)
Creatinine, Ser: 3.35 mg/dL — ABNORMAL HIGH (ref 0.61–1.24)
GFR, Estimated: 22 mL/min — ABNORMAL LOW (ref >60)
Glucose, Bld: 105 mg/dL — ABNORMAL HIGH (ref 70–99)
Phosphorus: 3.7 mg/dL (ref 2.5–4.6)
Potassium: 4.2 mmol/L (ref 3.5–5.1)
Sodium: 138 mmol/L (ref 135–145)

## 2023-12-18 LAB — FERRITIN: Ferritin: 228 ng/mL (ref 24–336)

## 2023-12-18 LAB — POCT HEMOGLOBIN-HEMACUE: Hemoglobin: 10 g/dL — ABNORMAL LOW (ref 13.0–17.0)

## 2023-12-18 MED ORDER — DARBEPOETIN ALFA 200 MCG/0.4ML IJ SOSY
200.0000 ug | PREFILLED_SYRINGE | Freq: Once | INTRAMUSCULAR | Status: AC
  Administered 2023-12-18: 200 ug via SUBCUTANEOUS

## 2023-12-18 MED ORDER — DARBEPOETIN ALFA 200 MCG/0.4ML IJ SOSY
PREFILLED_SYRINGE | INTRAMUSCULAR | Status: AC
  Filled 2023-12-18: qty 0.4

## 2023-12-20 ENCOUNTER — Other Ambulatory Visit: Payer: Self-pay | Admitting: Student

## 2023-12-20 MED ORDER — APIXABAN 5 MG PO TABS: 5.0000 mg | ORAL_TABLET | Freq: Two times a day (BID) | ORAL | 0 refills | Status: AC

## 2023-12-20 NOTE — Telephone Encounter (Signed)
 Copied from CRM #[id number]. Topic: Clinical - Medication Refill >> Dec 20, 2023 11:40 AM Suzette B wrote: Medication: apixaban  (ELIQUIS ) 5 MG TABS tablet  Has the patient contacted their pharmacy? No   This is the patient's preferred pharmacy:  [facility] [phone] - [place], [place] - [address] AT [facility] [address]  [place] [address] Phone: [phone] Fax: [phone]   Is this the correct pharmacy for this prescription? Yes If no, delete pharmacy and type the correct one.   Has the prescription been filled recently? Yes  Is the patient out of the medication? Yes  Has the patient been seen for an appointment in the last year OR does the patient have an upcoming appointment? Yes  Can we respond through MyChart? Yes  Agent: Please be advised that Rx refills may take up to 3 business days. We ask that you follow-up with your pharmacy.

## 2023-12-21 ENCOUNTER — Ambulatory Visit: Admitting: Physician Assistant

## 2023-12-21 ENCOUNTER — Other Ambulatory Visit: Payer: Self-pay | Admitting: Student

## 2023-12-21 DIAGNOSIS — I5032 Chronic diastolic (congestive) heart failure: Secondary | ICD-10-CM

## 2023-12-21 DIAGNOSIS — G8929 Other chronic pain: Secondary | ICD-10-CM

## 2023-12-21 DIAGNOSIS — M25562 Pain in left knee: Secondary | ICD-10-CM | POA: Diagnosis not present

## 2023-12-21 DIAGNOSIS — M5441 Lumbago with sciatica, right side: Secondary | ICD-10-CM

## 2023-12-21 DIAGNOSIS — M5442 Lumbago with sciatica, left side: Secondary | ICD-10-CM | POA: Diagnosis not present

## 2023-12-21 NOTE — Progress Notes (Signed)
 Office Visit Note   Patient: [name]           Date of Birth: 12/01/1975           MRN: [id number] Visit Date: 12/21/2023              Requested by: Renne Homans, MD [address] [place], [address] [place],  [place] [address] PCP: Renne Homans, MD  Chief Complaint  Patient presents with   Lower Back - Follow-up      HPI: Patient is a pleasant 48 year old gentleman who is returning for his low back pain radiating down his left leg he is an amputee on the right.  At her last visit we discussed trying physical therapy.  He actually went online and YouTube and found his own home exercise program and is doing a lot better  Assessment & Plan: Visit Diagnoses: Low back pain  Plan: His exam is improved and he is very comfortable.  I encouraged him to continue to do his low back exercises he may contact me if he has a reoccurrence could consider an MRI  Follow-Up Instructions: No follow-ups on file.   Ortho Exam  Patient is alert, oriented, no adenopathy, well-dressed, normal affect, normal respiratory effort. Negative straight leg raise on the left good dorsiflexion plantarflexion strength at 5 out of 5 extension and flexion of his leg 5 out of 5 no tenderness to palpation over the spine and buttock    Imaging: No results found. No images are attached to the encounter.  Labs: Lab Results  Component Value Date   HGBA1C 5.4 08/27/2023   HGBA1C 4.9 03/26/2023   HGBA1C 5.2 12/06/2022   ESRSEDRATE 91 (H) 04/26/2023   ESRSEDRATE 117 (H) 02/23/2023   ESRSEDRATE 72 (H) 06/18/2018   CRP 16.0 (H) 04/26/2023   CRP 20.4 (H) 02/23/2023   CRP 8.07 (H) 07/12/2011   LABURIC 9.2 (H) 07/19/2023   LABURIC 10.3 (H) 04/26/2023   LABURIC 11.9 (H) 03/26/2023   REPTSTATUS 07/24/2023 FINAL 07/19/2023   GRAMSTAIN  04/26/2023    FEW WBC PRESENT, PREDOMINANTLY PMN NO ORGANISMS SEEN    CULT  07/19/2023    NO GROWTH 5 DAYS Performed at [facility], [address], [place],  [place] [address]    LABORGA KLEBSIELLA PNEUMONIAE (A) 08/20/2020     Lab Results  Component Value Date   ALBUMIN 3.3 (L) 12/18/2023   ALBUMIN 3.6 11/30/2023   ALBUMIN 3.4 (L) 11/16/2023    Lab Results  Component Value Date   MG 2.0 03/16/2023   MG 1.8 03/15/2023   MG 1.9 03/14/2023   No results found for: VD25OH  No results found for: PREALBUMIN    Latest Ref Rng & Units 12/18/2023    8:10 AM 11/30/2023    9:17 AM 11/16/2023    9:23 AM  CBC EXTENDED  Hemoglobin 13.0 - 17.0 g/dL 89.9  87.4  88.7      There is no height or weight on file to calculate BMI.  Orders:  No orders of the defined types were placed in this encounter.  No orders of the defined types were placed in this encounter.    Procedures: No procedures performed  Clinical Data: No additional findings.  ROS:  All other systems negative, except as noted in the HPI. Review of Systems  Objective: Vital Signs: There were no vitals taken for this visit.  Specialty Comments:  No specialty comments available.  PMFS History: Patient Active Problem List  Diagnosis Date Noted   Erythropoietin-resistant anemia 09/18/2023   BPH (benign prostatic hyperplasia) 08/27/2023   Swelling of left index finger 07/19/2023   Chronic gout 07/19/2023   Tendinosis of right shoulder, possible supraspinatus tear 05/08/2023   Gout of right wrist 03/26/2023   Insomnia 03/26/2023   Volume overload 03/21/2023   Inflammatory arthropathy 02/23/2023   Medial epicondylitis of elbow, left 05/06/2022   Diabetic neuropathy (HCC) 01/24/2022   Benign paroxysmal positional vertigo 09/14/2020   Anemia associated with chronic renal failure    Chronic kidney disease, stage V (HCC)    Hemoglobinuria 12/12/2018   OSA (obstructive sleep apnea) 02/28/2018   Acute on chronic diastolic heart failure (HCC) 02/02/2018   Preventative health care 08/15/2017   Erectile dysfunction 04/22/2017   History of pulmonary embolus (PE) 06/09/2016    Depression 05/04/2016   Hypogonadism in male 05/04/2016   Urinary retention 05/04/2016   Dyslipidemia 08/12/2014   Unilateral AKA, right (HCC) 12/26/2013   Hyperlipidemia 02/19/2013   S/P cardiac cath, 02/18/13, normal coronaries 02/19/2013   CKD (chronic kidney disease) stage 4, GFR 15-29 ml/min (HCC) 02/19/2013   Hypertension 07/24/2011   Long term current use of anticoagulant therapy 07/19/2011   History of DVT (deep vein thrombosis) 07/28/2009   Morbid obesity (HCC) 10/26/2005   Insulin  dependent type 2 diabetes mellitus, controlled (HCC) 01/09/2002   Past Medical History:  Diagnosis Date   Acute venous embolism and thrombosis of deep vessels of proximal lower extremity (HCC) 07/19/2011   Anemia    Anginal pain    pt denies   Anxiety    Chest pain, neg MI, normal coronaries by cath 02/18/2013   pt denies   CHF (congestive heart failure) (HCC)    Chronic renal disease, stage IV (HCC) 02/19/2013   Colon polyps    Depression    Diabetic ulcer of right foot (HCC)    DVT (deep venous thrombosis) (HCC) 09/2002   patient reports additional DVTs in '06 & '11 (unconfirmed)   ED (erectile dysfunction)    GERD (gastroesophageal reflux disease)    History of blood transfusion    related to OR (10/31/2016)   Hyperlipidemia 02/19/2013   Hypertension    Nausea & vomiting    constant for the last couple weeks (10/31/2016)   Nephrotic syndrome    Obesity    BMI 44, weight 346 pounds 01/30/14   OSA (obstructive sleep apnea) 02/28/2018   Mild obstructive sleep apnea with an AHI of 9.8/h but severe during REM sleep with an AHI of 33.8/h.  Oxygen  saturations dropped to 86% and there was moderate snoring   Peripheral edema    Pneumonia    Prosthesis adjustment 08/17/2016   Pulmonary embolism (HCC) 09/2002   treated with 6 months of warfarin   Pyelonephritis 02/02/2018   Type I diabetes mellitus (HCC) dx'd 2001   Urinary retention     Family History  Problem Relation Age of  Onset   Heart disease Mother    Diabetes Mother    Diabetes Maternal Uncle    Diabetes Cousin     Past Surgical History:  Procedure Laterality Date   AMPUTATION Right 12/22/2013   Procedure: AMPUTATION BELOW KNEE;  Surgeon: Norleen Armor, MD;  Location: [facility] OR;  Service: Orthopedics;  Laterality: Right;   AMPUTATION Right 02/19/2014   Procedure: RIGHT ABOVE KNEE AMPUTATION ;  Surgeon: Norleen Armor, MD;  Location: [facility] OR;  Service: Orthopedics;  Laterality: Right;   blood clot removal  2016   CARDIAC  CATHETERIZATION  02/18/2013   normal coronaries   ESOPHAGOGASTRODUODENOSCOPY N/A 11/02/2016   Procedure: ESOPHAGOGASTRODUODENOSCOPY (EGD);  Surgeon: Avram Lupita BRAVO, MD;  Location: [facility] ENDOSCOPY;  Service: Endoscopy;  Laterality: N/A;   I & D EXTREMITY Right 12/14/2013   Procedure: IRRIGATION AND DEBRIDEMENT RIGHT FOOT;  Surgeon: Elspeth JONELLE Her, MD;  Location: [facility] OR;  Service: Orthopedics;  Laterality: Right;   INCISION AND DRAINAGE ABSCESS  2007; 2015   back   INCISION AND DRAINAGE OF WOUND Right 12/22/2013   Procedure: I&D RIGHT BUTTOCK;  Surgeon: Norleen Armor, MD;  Location: [facility] OR;  Service: Orthopedics;  Laterality: Right;   LEFT HEART CATHETERIZATION WITH CORONARY ANGIOGRAM N/A 02/18/2013   Procedure: LEFT HEART CATHETERIZATION WITH CORONARY ANGIOGRAM;  Surgeon: Peter M Jordan, MD;  Location: [facility] CATH LAB;  Service: Cardiovascular;  Laterality: N/A;   Social History   Occupational History   Occupation: student    Comment: [employer]   Occupation: disability  Tobacco Use   Smoking status: Never   Smokeless tobacco: Never  Vaping Use   Vaping status: Never Used  Substance and Sexual Activity   Alcohol use: Yes    Comment: 10/31/2016 a few beers q 6 months or so   Drug use: No   Sexual activity: Yes

## 2023-12-28 ENCOUNTER — Encounter (HOSPITAL_COMMUNITY)

## 2024-01-04 ENCOUNTER — Ambulatory Visit (HOSPITAL_COMMUNITY)
Admission: RE | Admit: 2024-01-04 | Discharge: 2024-01-04 | Disposition: A | Discharge status: Home / Self Care | Source: Ambulatory Visit | Attending: Nephrology | Admitting: Nephrology

## 2024-01-04 VITALS — BP 123/66 | HR 83 | Temp 97.1°F | Resp 16

## 2024-01-04 DIAGNOSIS — N185 Chronic kidney disease, stage 5: Secondary | ICD-10-CM | POA: Insufficient documentation

## 2024-01-04 DIAGNOSIS — D631 Anemia in chronic kidney disease: Secondary | ICD-10-CM | POA: Diagnosis present

## 2024-01-04 LAB — IRON AND TIBC
Iron: 41 ug/dL — ABNORMAL LOW (ref 45–182)
Saturation Ratios: 15 % — ABNORMAL LOW (ref 17.9–39.5)
TIBC: 270 ug/dL (ref 250–450)
UIBC: 230 ug/dL

## 2024-01-04 LAB — RENAL FUNCTION PANEL
Albumin: 4 g/dL (ref 3.5–5.0)
Anion gap: 12 (ref 5–15)
BUN: 57 mg/dL — ABNORMAL HIGH (ref 6–20)
CO2: 23 mmol/L (ref 22–32)
Calcium: 8.8 mg/dL — ABNORMAL LOW (ref 8.9–10.3)
Chloride: 104 mmol/L (ref 98–111)
Creatinine, Ser: 3.15 mg/dL — ABNORMAL HIGH (ref 0.61–1.24)
GFR, Estimated: 24 mL/min — ABNORMAL LOW
Glucose, Bld: 129 mg/dL — ABNORMAL HIGH (ref 70–99)
Phosphorus: 4.3 mg/dL (ref 2.5–4.6)
Potassium: 4 mmol/L (ref 3.5–5.1)
Sodium: 138 mmol/L (ref 135–145)

## 2024-01-04 LAB — POCT HEMOGLOBIN-HEMACUE: Hemoglobin: 9.9 g/dL — ABNORMAL LOW (ref 13.0–17.0)

## 2024-01-04 LAB — FERRITIN: Ferritin: 339 ng/mL — ABNORMAL HIGH (ref 24–336)

## 2024-01-04 MED ORDER — DARBEPOETIN ALFA 200 MCG/0.4ML IJ SOSY
PREFILLED_SYRINGE | INTRAMUSCULAR | Status: AC
  Filled 2024-01-04: qty 0.4

## 2024-01-04 MED ORDER — DARBEPOETIN ALFA 200 MCG/0.4ML IJ SOSY
200.0000 ug | PREFILLED_SYRINGE | Freq: Once | INTRAMUSCULAR | Status: AC
  Administered 2024-01-04: 200 ug via SUBCUTANEOUS

## 2024-01-07 ENCOUNTER — Ambulatory Visit (INDEPENDENT_AMBULATORY_CARE_PROVIDER_SITE_OTHER): Admitting: Endocrinology

## 2024-01-07 ENCOUNTER — Ambulatory Visit: Payer: Self-pay | Admitting: Endocrinology

## 2024-01-07 ENCOUNTER — Encounter: Payer: Self-pay | Admitting: Endocrinology

## 2024-01-07 VITALS — BP 132/82 | HR 82 | Resp 16 | Ht 74.0 in | Wt 323.0 lb

## 2024-01-07 DIAGNOSIS — Z7985 Long-term (current) use of injectable non-insulin antidiabetic drugs: Secondary | ICD-10-CM | POA: Diagnosis not present

## 2024-01-07 DIAGNOSIS — Z794 Long term (current) use of insulin: Secondary | ICD-10-CM

## 2024-01-07 DIAGNOSIS — E119 Type 2 diabetes mellitus without complications: Secondary | ICD-10-CM

## 2024-01-07 DIAGNOSIS — E118 Type 2 diabetes mellitus with unspecified complications: Secondary | ICD-10-CM | POA: Diagnosis not present

## 2024-01-07 LAB — POCT GLYCOSYLATED HEMOGLOBIN (HGB A1C): Hemoglobin A1C: 5.6 % (ref 4.0–5.6)

## 2024-01-07 MED ORDER — INSULIN GLARGINE 100 UNIT/ML SOLOSTAR PEN: 28.0000 [IU] | PEN_INJECTOR | Freq: Every day | SUBCUTANEOUS | 3 refills | Status: AC

## 2024-01-07 NOTE — Progress Notes (Signed)
 "  Outpatient Endocrinology Note Douglas Smolinsky, MD  01/07/2024  Patient's Name: [name]    DOB: [birth year]    MRN: [id number]                                                    REASON OF VISIT: Follow up for type 2 diabetes mellitus  PCP: Renne Homans, MD  HISTORY OF PRESENT ILLNESS:   [name] is a 48 y.o. old male with past medical history listed below, is here for follow up of type 2 diabetes mellitus.   Pertinent Diabetes History: Patient was diagnosed with type 2 diabetes mellitus in 2001.  Weight was 400 pounds at the time of diagnosis.   Chronic Diabetes Complications : Retinopathy: ? Yes, Reports following with retina specialist however denies ? diabetes retinopathy. [facility].  Nephropathy: CKD IV following with nephrology.  Microalbuminuria present. Peripheral neuropathy: Diabetic foot ulcers, status post right AKA 2015/2016. Coronary artery disease: no Stroke: no  Relevant comorbidities and cardiovascular risk factors: Obesity: yes Body mass index is 41.47 kg/m.  Hypertension: yes Hyperlipidemia. Yes, on a statin.  Current / Home Diabetic regimen includes: Tresiba  30 units daily. NovoLog  0 units breakfast 0-4 units before lunch and 0-4 units at dinner.  NovoLog  sliding scale only.  Mounjaro  15 mg weekly.  Prior diabetic medications: Metformin  stopped due to diarrhea. Ozempic  had vomiting.  Glycemic data:    CONTINUOUS GLUCOSE MONITORING SYSTEM (CGMS) INTERPRETATION:                         FreeStyle Libre 2 CGM-  Sensor Download (Sensor download was reviewed and summarized below.)  Dates: December 16 to December 29 , 2025, 14 days  % data captured: 86%    Previous :    Impression: Mostly acceptable blood sugar with frequent mild hypoglycemia mostly in blood sugar 60s in the early morning related to workout.  Rare mild hypoglycemia in the afternoon and in the evening.  No significant hyperglycemia.  No persistent  hypoglycemia.  Hypoglycemia: Patient has minor hypoglycemic episodes. Patient has hypoglycemia awareness.  Factors modifying glucose control: 1.  Diabetic diet assessment: 2 meals a day.  2.  Staying active or exercising: Workout a mostly every day 5-6 times a week in the morning.  3.  Medication compliance: compliant all of the time.  Interval history  CGM data as reviewed above.  Mostly acceptable blood sugar with hypoglycemia related to workout.  He has been using NovoLog  sliding scale only, using Tresiba  30 units daily.  Hemoglobin A1c 5.6% today.  He denies numbness and tingling of the left foot.  He has been regularly following with ophthalmology at [facility].  No other complaints today.  REVIEW OF SYSTEMS As per history of present illness.   PAST MEDICAL HISTORY: Past Medical History:  Diagnosis Date   Acute venous embolism and thrombosis of deep vessels of proximal lower extremity (HCC) 07/19/2011   Anemia    Anginal pain    pt denies   Anxiety    Chest pain, neg MI, normal coronaries by cath 02/18/2013   pt denies   CHF (congestive heart failure) (HCC)    Chronic renal disease, stage IV (HCC) 02/19/2013   Colon polyps    Depression    Diabetic ulcer of  right foot (HCC)    DVT (deep venous thrombosis) (HCC) 09/2002   patient reports additional DVTs in '06 & '11 (unconfirmed)   ED (erectile dysfunction)    GERD (gastroesophageal reflux disease)    History of blood transfusion    related to OR (10/31/2016)   Hyperlipidemia 02/19/2013   Hypertension    Nausea & vomiting    constant for the last couple weeks (10/31/2016)   Nephrotic syndrome    Obesity    BMI 44, weight 346 pounds 01/30/14   OSA (obstructive sleep apnea) 02/28/2018   Mild obstructive sleep apnea with an AHI of 9.8/h but severe during REM sleep with an AHI of 33.8/h.  Oxygen  saturations dropped to 86% and there was moderate snoring   Peripheral edema    Pneumonia    Prosthesis  adjustment 08/17/2016   Pulmonary embolism (HCC) 09/2002   treated with 6 months of warfarin   Pyelonephritis 02/02/2018   Type I diabetes mellitus (HCC) dx'd 2001   Urinary retention     PAST SURGICAL HISTORY: Past Surgical History:  Procedure Laterality Date   AMPUTATION Right 12/22/2013   Procedure: AMPUTATION BELOW KNEE;  Surgeon: Norleen Armor, MD;  Location: [facility] OR;  Service: Orthopedics;  Laterality: Right;   AMPUTATION Right 02/19/2014   Procedure: RIGHT ABOVE KNEE AMPUTATION ;  Surgeon: Norleen Armor, MD;  Location: [facility] OR;  Service: Orthopedics;  Laterality: Right;   blood clot removal  2016   CARDIAC CATHETERIZATION  02/18/2013   normal coronaries   ESOPHAGOGASTRODUODENOSCOPY N/A 11/02/2016   Procedure: ESOPHAGOGASTRODUODENOSCOPY (EGD);  Surgeon: Avram Lupita BRAVO, MD;  Location: [facility] ENDOSCOPY;  Service: Endoscopy;  Laterality: N/A;   I & D EXTREMITY Right 12/14/2013   Procedure: IRRIGATION AND DEBRIDEMENT RIGHT FOOT;  Surgeon: Elspeth JONELLE Her, MD;  Location: [facility] OR;  Service: Orthopedics;  Laterality: Right;   INCISION AND DRAINAGE ABSCESS  2007; 2015   back   INCISION AND DRAINAGE OF WOUND Right 12/22/2013   Procedure: I&D RIGHT BUTTOCK;  Surgeon: Norleen Armor, MD;  Location: [facility] OR;  Service: Orthopedics;  Laterality: Right;   LEFT HEART CATHETERIZATION WITH CORONARY ANGIOGRAM N/A 02/18/2013   Procedure: LEFT HEART CATHETERIZATION WITH CORONARY ANGIOGRAM;  Surgeon: Peter M Jordan, MD;  Location: [facility] CATH LAB;  Service: Cardiovascular;  Laterality: N/A;    ALLERGIES: Allergies  Allergen Reactions   Reglan  [Metoclopramide ] Other (See Comments)    Dysphoric reaction   Chlorthalidone Hives   Metformin  And Related Diarrhea   Nsaids Other (See Comments)    Chronic kidney disease IV   Ozempic  (0.25 Or 0.5 Mg-Dose) [Semaglutide (0.25 Or 0.5mg -Dos)] Nausea And Vomiting   Thiazide-Type Diuretics Other (See Comments)    Gout    FAMILY HISTORY:  Family History  Problem Relation Age of  Onset   Heart disease Mother    Diabetes Mother    Diabetes Maternal Uncle    Diabetes Cousin     SOCIAL HISTORY: Social History   Socioeconomic History   Marital status: Single    Spouse name: Not on file   Number of children: 0   Years of education: 15.5   Highest education level: Some college, no degree  Occupational History   Occupation: student    Comment: [employer]   Occupation: disability  Tobacco Use   Smoking status: Never   Smokeless tobacco: Never  Vaping Use   Vaping status: Never Used  Substance and Sexual Activity   Alcohol use: Yes    Comment: 10/31/2016 a few beers  q 6 months or so   Drug use: No   Sexual activity: Yes  Other Topics Concern   Not on file  Social History Narrative   Financial assistance approved for 100% discount at [facility] and has [facility] card per Barnie Potters   09/01/2009.      Lives in [place] with mother and sister.            Social Drivers of Health   Tobacco Use: Low Risk (01/07/2024)   Patient History    Smoking Tobacco Use: Never    Smokeless Tobacco Use: Never    Passive Exposure: Not on file  Financial Resource Strain: Medium Risk (01/31/2023)   Overall Financial Resource Strain (CARDIA)    Difficulty of Paying Living Expenses: Somewhat hard  Food Insecurity: No Food Insecurity (07/20/2023)   Epic    Worried About Programme Researcher, Broadcasting/film/video in the Last Year: Never true    Ran Out of Food in the Last Year: Never true  Transportation Needs: No Transportation Needs (07/20/2023)   Epic    Lack of Transportation (Medical): No    Lack of Transportation (Non-Medical): No  Physical Activity: Sufficiently Active (01/31/2023)   Exercise Vital Sign    Days of Exercise per Week: 6 days    Minutes of Exercise per Session: 120 min  Stress: No Stress Concern Present (01/31/2023)   [employer] of Occupational Health - Occupational Stress Questionnaire    Feeling of Stress : Not at all  Social Connections: Socially Isolated (07/20/2023)    Social Connection and Isolation Panel    Frequency of Communication with Friends and Family: More than three times a week    Frequency of Social Gatherings with Friends and Family: More than three times a week    Attends Religious Services: Never    Database Administrator or Organizations: No    Attends Banker Meetings: Never    Marital Status: Never married  Depression (PHQ2-9): Low Risk (08/27/2023)   Depression (PHQ2-9)    PHQ-2 Score: 0  Alcohol Screen: Low Risk (01/31/2023)   Alcohol Screen    Last Alcohol Screening Score (AUDIT): 1  Housing: Low Risk (07/20/2023)   Epic    Unable to Pay for Housing in the Last Year: No    Number of Times Moved in the Last Year: 0    Homeless in the Last Year: No  Utilities: Not At Risk (07/20/2023)   Epic    Threatened with loss of utilities: No  Health Literacy: Adequate Health Literacy (01/31/2023)   B1300 Health Literacy    Frequency of need for help with medical instructions: Never    MEDICATIONS:  Current Outpatient Medications  Medication Sig Dispense Refill   acetaminophen  (TYLENOL ) 325 MG tablet Take 2 tablets (650 mg total) by mouth every 6 (six) hours. 30 tablet 0   allopurinol  (ZYLOPRIM ) 100 MG tablet Take 1 tablet (100 mg total) by mouth daily. 30 tablet 3   amLODipine  (NORVASC ) 10 MG tablet Take 1 tablet (10 mg total) by mouth at bedtime. 60 tablet 3   apixaban  (ELIQUIS ) 5 MG TABS tablet Take 1 tablet (5 mg total) by mouth 2 (two) times daily. 180 tablet 0   carvedilol  (COREG ) 25 MG tablet TAKE 1 TABLET BY MOUTH TWICE DAILY WITH A MEAL 180 tablet 0   cloNIDine  (CATAPRES  - DOSED IN MG/24 HR) 0.1 mg/24hr patch PLACE 1 PATCH ONTO THE SKIN ONCE A WEEK(THURSDAYS) 4 patch 0   Continuous Glucose Receiver (  FREESTYLE LIBRE 2 READER) DEVI Use with sensors to monitor blood glucose 1 each 0   Continuous Glucose Receiver (FREESTYLE LIBRE 2 READER) DEVI 1 each by Does not apply route daily. 1 each 0   Continuous Glucose Sensor  (FREESTYLE LIBRE 2 SENSOR) MISC Change every 14 days as directed by manufacturer 6 each 3   diclofenac  Sodium (VOLTAREN ) 1 % GEL Apply 2 g topically 4 (four) times daily. (Patient taking differently: Apply 2 g topically daily as needed (for pain).) 100 g 3   fluticasone  (FLONASE ) 50 MCG/ACT nasal spray Place 2 sprays into both nostrils daily. (Patient taking differently: Place 2 sprays into both nostrils daily as needed for allergies.) 15.8 mL 2   furosemide  (LASIX ) 20 MG tablet Take 1 tablet (20 mg total) by mouth daily. 30 tablet 1   furosemide  (LASIX ) 80 MG tablet Take 1 tablet (80 mg total) by mouth 2 (two) times daily. (Patient taking differently: Take 80 mg by mouth daily.) 30 tablet 1   gabapentin  (NEURONTIN ) 100 MG capsule TAKE 1 CAPSULE BY MOUTH IN THE MORNING AND 1 IN THE AFTERNOON AND 2 NIGHTLY 270 capsule 0   gabapentin  (NEURONTIN ) 100 MG capsule TAKE 1 CAPSULE BY MOUTH IN THE MORNING AND 1 CAPSULE IN THE AFTERNOON AND 2 CAPSULES NIGHTLY 270 capsule 0   GVOKE HYPOPEN  2-PACK 1 MG/0.2ML SOAJ Inject 1 mg into the skin as needed (severe hypoglycemia with not able to take oral medication or severe alterned mental status / unconcious.). 1 mL 2   hydrALAZINE  (APRESOLINE ) 100 MG tablet Take 100 mg by mouth 3 (three) times daily.     HYDROcodone -acetaminophen  (NORCO/VICODIN) 5-325 MG tablet Take 1 tablet by mouth every 6 (six) hours as needed for moderate pain (pain score 4-6). 20 tablet 0   Insulin  Pen Needle 32G X 4 MM MISC Use with pens to administer insulin  before meals and at bedtime 100 each 3   KLOR-CON  M20 20 MEQ tablet Take 1 tablet (20 mEq total) by mouth 3 (three) times daily. 90 tablet 2   lidocaine  (LIDODERM ) 5 % Place 1 patch onto the skin daily. Remove & Discard patch within 12 hours or as directed by MD 30 patch 0   lovastatin  (MEVACOR ) 20 MG tablet Take 1 tablet (20 mg total) by mouth daily. 90 tablet 0   lovastatin  (MEVACOR ) 20 MG tablet Take 1 tablet (20 mg total) by mouth daily.  90 tablet 0   methocarbamol  (ROBAXIN ) 500 MG tablet Take 1 tablet (500 mg total) by mouth every 6 (six) hours as needed for muscle spasms. 30 tablet 0   methylPREDNISolone  (MEDROL  DOSEPAK) 4 MG TBPK tablet Take as directed with food.  Discontinue if spikes blood sugar monitor with continuous monitor 21 tablet 0   NOVOLOG  FLEXPEN 100 UNIT/ML FlexPen Inject 0-10 Units into the skin 3 (three) times daily with meals. Glucose 141-180: inject 4 units, Glucose 181-220: inject 6 units, Glucose 221-260: inject 8 units, Glucose 261-300: inject 10 units, If glucose if greater than 300, please inject 12 units and call your PCP and/or endocrinologist office 30 mL 4   oxybutynin  (DITROPAN ) 5 MG tablet Take 1 tablet (5 mg total) by mouth 3 (three) times daily. 90 tablet 2   tamsulosin  (FLOMAX ) 0.4 MG CAPS capsule Take 1 capsule (0.4 mg total) by mouth at bedtime. 90 capsule 0   tirzepatide  (MOUNJARO ) 15 MG/0.5ML Pen INJECT 15 MG SUBCUTANEOUSLY ONCE A WEEK 6 mL 4   insulin  glargine (LANTUS ) 100 UNIT/ML Solostar Pen  Inject 28 Units into the skin daily. Inject 30 units into skin daily 30 mL 3   No current facility-administered medications for this visit.    PHYSICAL EXAM: Vitals:   01/07/24 1008  BP: 132/82  Pulse: 82  Resp: 16  SpO2: 92%  Weight: (!) 323 lb (146.5 kg)  Height: 6' 2 (1.88 m)      Body mass index is 41.47 kg/m.  Wt Readings from Last 3 Encounters:  01/07/24 (!) 323 lb (146.5 kg)  08/28/23 (!) 315 lb (142.9 kg)  07/19/23 (!) 321 lb (145.6 kg)    General: Well developed, well nourished male in no apparent distress.  HEENT: AT/[place], no external lesions.  Eyes: Conjunctiva clear and no icterus. Neck: Neck supple  Lungs: Respirations not labored Neurologic: Alert, oriented, normal speech Extremities / Skin: Dry. Rt AKA. Psychiatric: Does not appear depressed or anxious  Diabetic Foot Exam - Simple   Simple Foot Form Diabetic Foot exam was performed with the following findings: Yes  01/07/2024 10:42 AM  Visual Inspection See comments: Yes Sensation Testing See comments: Yes Pulse Check See comments: Yes Comments Right AKA.  Left foot no ulcer, callus.  Dorsalis pedis palpable.  Monofilament sensory exam diminished.    LABS Reviewed Lab Results  Component Value Date   HGBA1C 5.6 01/07/2024   HGBA1C 5.4 08/27/2023   HGBA1C 4.9 03/26/2023   Lab Results  Component Value Date   FRUCTOSAMINE 271 08/28/2022   FRUCTOSAMINE 299 (H) 05/09/2022   FRUCTOSAMINE 294 (H) 08/23/2021   Lab Results  Component Value Date   CHOL 119 12/30/2021   HDL 38.10 (L) 12/30/2021   LDLCALC 60 12/30/2021   TRIG 104.0 12/30/2021   CHOLHDL 3 12/30/2021   Lab Results  Component Value Date   MICRALBCREAT 242 (H) 08/28/2023   MICRALBCREAT 1,528.4 (H) 02/15/2017   Lab Results  Component Value Date   CREATININE 3.15 (H) 01/04/2024   Lab Results  Component Value Date   GFR 28.51 (L) 04/15/2020    ASSESSMENT / PLAN  1. Controlled diabetes mellitus type 2 with complications (HCC)   2. Insulin  dependent type 2 diabetes mellitus, controlled (HCC)    Diabetes Mellitus type 2, complicated by peripheral neuropathy status post right AKA, CKD. - Diabetic status / severity: Controlled  Lab Results  Component Value Date   HGBA1C 5.6 01/07/2024    - Hemoglobin A1c goal : <6.5%  Overall controlled type 2 diabetes mellitus.  Hemoglobin A1c likely falsely lowered due to anemia.  GMI on CGM 6.4%.  He has not been requiring NovoLog  lately, using sliding scale only.  He is mainly taking Tresiba  30 units daily and Mounjaro  15 mg weekly.  - Medications: See below.  Decrease Tresiba  from 30 to 28 units daily.  Due to hypoglycemia in the morning.  Continue Mounjaro  15 mg weekly.  Novolog  0-4 units with meals 2 times a day , may adjust based on meals and experience.  No NovoLog  with breakfast.  Okay to use based on sliding scale.  - Home glucose testing: Continue CGM Freestyle libre  2 and check as needed.   - Discussed/ Gave Hypoglycemia treatment plan.  He has Glucagon  Emergency Kit prescribed.  Advised to use glucose tablet for correction of hypoglycemia.  # Consult : not required at this time.   # He has diabetic nephropathy, following with nephrology.  Will check urine microalbumin creatinine ratio today.  Last  Lab Results  Component Value Date   MICRALBCREAT 242 (H) 08/28/2023    #  Foot check nightly, history of diabetic foot ulcer status post right AKA.  # Annual dilated diabetic eye exams.  Following with retina specialist, status of retinopathy unknown at this time.  - Diet: Eat reasonable portion sizes to promote a healthy weight - Life style / activity / exercise: Discussed.  He regularly workout 5-6 times a week.  2. Blood pressure  -  BP Readings from Last 1 Encounters:  01/07/24 132/82    - Control is in target. Blood pressure acceptable. - No change in current plans.  3. Lipid status / Hyperlipidemia - Last  Lab Results  Component Value Date   LDLCALC 60 12/30/2021   - Continue lovastatin  20 mg daily.  Managed by primary care provider.  Diagnoses and all orders for this visit:  Controlled diabetes mellitus type 2 with complications (HCC)  Insulin  dependent type 2 diabetes mellitus, controlled (HCC) -     POCT glycosylated hemoglobin (Hb A1C) -     insulin  glargine (LANTUS ) 100 UNIT/ML Solostar Pen; Inject 28 Units into the skin daily. Inject 30 units into skin daily    DISPOSITION Follow up in clinic in 4 months suggested.     All questions answered and patient verbalized understanding of the plan.  Kae Lauman, MD [facility] Endocrinology [facility] [address] [place], [address] [place], [place] [address] Phone # [phone]  At least part of this note was generated using voice recognition software. Inadvertent word errors may have occurred, which were not recognized during the proofreading process. "

## 2024-01-08 ENCOUNTER — Other Ambulatory Visit: Payer: Self-pay | Admitting: *Deleted

## 2024-01-08 ENCOUNTER — Other Ambulatory Visit: Payer: Self-pay | Admitting: Endocrinology

## 2024-01-08 DIAGNOSIS — E1165 Type 2 diabetes mellitus with hyperglycemia: Secondary | ICD-10-CM

## 2024-01-08 NOTE — Telephone Encounter (Signed)
 Received refill request from pt's pharmacy for furosemide  20mg  tabs take 1 tab daily.  Pt's chart reflects furosemide  20mg  1 tab daily AND furosemide  80mg  tabs take 1 tab two times daily  CMA contacted pt to clarify dose-pt states that [facility] handles is furosemide  prescriptions and pharmacy sent request to [facility] in error.  Pt states that he will reach [facility] and [facility] can disregard refill request.  Pt instructed to call back should he have difficulty with getting refill.

## 2024-01-11 ENCOUNTER — Telehealth (HOSPITAL_COMMUNITY): Payer: Self-pay

## 2024-01-11 NOTE — Telephone Encounter (Signed)
 Auth Submission: NO AUTH NEEDED Site of care: Site of care: [facility] Payer: [employer] Dual Medication & CPT/J Code(s) submitted: Aranesp  (G9118) Diagnosis Code:  Route of submission (phone, fax, portal): portal Phone # Fax # Auth type: Buy/Bill HB Units/visits requested: 200mcg q2weeks Reference number: [id number] Approval from: 01/10/24 to 01/08/25

## 2024-01-18 ENCOUNTER — Encounter (HOSPITAL_COMMUNITY)
Admission: RE | Admit: 2024-01-18 | Discharge: 2024-01-18 | Disposition: A | Discharge status: Home / Self Care | Source: Ambulatory Visit | Attending: Nephrology | Admitting: Nephrology

## 2024-01-18 ENCOUNTER — Other Ambulatory Visit: Payer: Self-pay | Admitting: *Deleted

## 2024-01-18 ENCOUNTER — Telehealth: Payer: Self-pay | Admitting: *Deleted

## 2024-01-18 VITALS — BP 146/78 | HR 89 | Temp 97.6°F | Resp 16

## 2024-01-18 DIAGNOSIS — D631 Anemia in chronic kidney disease: Secondary | ICD-10-CM | POA: Insufficient documentation

## 2024-01-18 DIAGNOSIS — N185 Chronic kidney disease, stage 5: Secondary | ICD-10-CM | POA: Insufficient documentation

## 2024-01-18 DIAGNOSIS — I5032 Chronic diastolic (congestive) heart failure: Secondary | ICD-10-CM

## 2024-01-18 LAB — RENAL FUNCTION PANEL
Albumin: 3.9 g/dL (ref 3.5–5.0)
Anion gap: 12 (ref 5–15)
BUN: 70 mg/dL — ABNORMAL HIGH (ref 6–20)
CO2: 22 mmol/L (ref 22–32)
Calcium: 9.1 mg/dL (ref 8.9–10.3)
Chloride: 103 mmol/L (ref 98–111)
Creatinine, Ser: 3.06 mg/dL — ABNORMAL HIGH (ref 0.61–1.24)
GFR, Estimated: 24 mL/min — ABNORMAL LOW
Glucose, Bld: 94 mg/dL (ref 70–99)
Phosphorus: 4 mg/dL (ref 2.5–4.6)
Potassium: 4.2 mmol/L (ref 3.5–5.1)
Sodium: 137 mmol/L (ref 135–145)

## 2024-01-18 LAB — IRON AND TIBC
Iron: 58 ug/dL (ref 45–182)
Saturation Ratios: 20 % (ref 17.9–39.5)
TIBC: 295 ug/dL (ref 250–450)
UIBC: 237 ug/dL

## 2024-01-18 LAB — FERRITIN: Ferritin: 347 ng/mL — ABNORMAL HIGH (ref 24–336)

## 2024-01-18 LAB — POCT HEMOGLOBIN-HEMACUE: Hemoglobin: 10.4 g/dL — ABNORMAL LOW (ref 13.0–17.0)

## 2024-01-18 MED ORDER — DARBEPOETIN ALFA 200 MCG/0.4ML IJ SOSY
200.0000 ug | PREFILLED_SYRINGE | Freq: Once | INTRAMUSCULAR | Status: AC
  Administered 2024-01-18: 200 ug via SUBCUTANEOUS

## 2024-01-18 MED ORDER — CARVEDILOL 25 MG PO TABS: 25.0000 mg | ORAL_TABLET | Freq: Two times a day (BID) | ORAL | 0 refills | Status: AC

## 2024-01-18 MED ORDER — DARBEPOETIN ALFA 200 MCG/0.4ML IJ SOSY
PREFILLED_SYRINGE | INTRAMUSCULAR | Status: AC
  Filled 2024-01-18: qty 0.4

## 2024-01-18 MED ORDER — OXYBUTYNIN CHLORIDE 5 MG PO TABS: 5.0000 mg | ORAL_TABLET | Freq: Three times a day (TID) | ORAL | 2 refills | Status: AC

## 2024-01-18 NOTE — Telephone Encounter (Signed)
 Prescriptions have been transferred to the [facility] on [address]                      Clinical - Prescription Issue >> Jan 18, 2024 11:14 AM [name] ORN wrote: Reason for CRM: Patient called to check to see if pharmacy requested medication. Not showing request but show refills and new 90 day supply sent. Confirmed pharmacy. Patient states he would like medication sent to [facility] [phone] - [place], [place] - [address]. They were sent to [facility]. Patient out of medication. oxybutynin  (DITROPAN ) 5 MG tablet carvedilol  (COREG ) 25 MG tablet Thank You

## 2024-01-24 ENCOUNTER — Other Ambulatory Visit: Payer: Self-pay | Admitting: *Deleted

## 2024-01-24 NOTE — Telephone Encounter (Signed)
 There are ZERO refills remaining on this prescription. Your patient is requesting advance approval of refills for this medication to PREVENT ANY MISSED DOSES. (Note: Prescription will be refilled when due)

## 2024-02-01 ENCOUNTER — Encounter (HOSPITAL_COMMUNITY)
Admission: RE | Admit: 2024-02-01 | Discharge: 2024-02-01 | Disposition: A | Source: Ambulatory Visit | Attending: Nephrology

## 2024-02-01 VITALS — BP 148/82 | HR 56 | Temp 97.4°F | Resp 17

## 2024-02-01 DIAGNOSIS — D631 Anemia in chronic kidney disease: Secondary | ICD-10-CM

## 2024-02-01 DIAGNOSIS — N185 Chronic kidney disease, stage 5: Secondary | ICD-10-CM

## 2024-02-01 LAB — IRON AND TIBC
Iron: 43 ug/dL — ABNORMAL LOW (ref 45–182)
Saturation Ratios: 15 % — ABNORMAL LOW (ref 17.9–39.5)
TIBC: 297 ug/dL (ref 250–450)
UIBC: 254 ug/dL

## 2024-02-01 LAB — RENAL FUNCTION PANEL
Albumin: 4.1 g/dL (ref 3.5–5.0)
Anion gap: 12 (ref 5–15)
BUN: 65 mg/dL — ABNORMAL HIGH (ref 6–20)
CO2: 23 mmol/L (ref 22–32)
Calcium: 9.3 mg/dL (ref 8.9–10.3)
Chloride: 101 mmol/L (ref 98–111)
Creatinine, Ser: 3.27 mg/dL — ABNORMAL HIGH (ref 0.61–1.24)
GFR, Estimated: 23 mL/min — ABNORMAL LOW
Glucose, Bld: 67 mg/dL — ABNORMAL LOW (ref 70–99)
Phosphorus: 4.1 mg/dL (ref 2.5–4.6)
Potassium: 4.5 mmol/L (ref 3.5–5.1)
Sodium: 136 mmol/L (ref 135–145)

## 2024-02-01 LAB — FERRITIN: Ferritin: 304 ng/mL (ref 24–336)

## 2024-02-01 LAB — POCT HEMOGLOBIN-HEMACUE: Hemoglobin: 10.5 g/dL — ABNORMAL LOW (ref 13.0–17.0)

## 2024-02-01 MED ORDER — DARBEPOETIN ALFA 200 MCG/0.4ML IJ SOSY
200.0000 ug | PREFILLED_SYRINGE | Freq: Once | INTRAMUSCULAR | Status: AC
  Administered 2024-02-01: 200 ug via SUBCUTANEOUS

## 2024-02-01 MED ORDER — DARBEPOETIN ALFA 200 MCG/0.4ML IJ SOSY
PREFILLED_SYRINGE | INTRAMUSCULAR | Status: AC
  Filled 2024-02-01: qty 0.4

## 2024-02-08 ENCOUNTER — Other Ambulatory Visit: Payer: Self-pay | Admitting: *Deleted

## 2024-02-08 MED ORDER — TAMSULOSIN HCL 0.4 MG PO CAPS: 0.4000 mg | ORAL_CAPSULE | Freq: Every day | ORAL | 3 refills | Status: AC

## 2024-02-12 ENCOUNTER — Other Ambulatory Visit: Payer: Self-pay

## 2024-02-12 DIAGNOSIS — E119 Type 2 diabetes mellitus without complications: Secondary | ICD-10-CM

## 2024-02-12 MED ORDER — GABAPENTIN 100 MG PO CAPS: ORAL_CAPSULE | ORAL | 0 refills | Status: AC

## 2024-02-15 ENCOUNTER — Inpatient Hospital Stay (HOSPITAL_COMMUNITY): Admission: RE | Admit: 2024-02-15 | Disposition: A | Source: Ambulatory Visit | Attending: Nephrology

## 2024-02-15 VITALS — BP 150/78 | HR 87 | Temp 97.2°F | Resp 17

## 2024-02-15 DIAGNOSIS — D631 Anemia in chronic kidney disease: Secondary | ICD-10-CM

## 2024-02-15 DIAGNOSIS — N185 Chronic kidney disease, stage 5: Secondary | ICD-10-CM

## 2024-02-15 LAB — RENAL FUNCTION PANEL
Albumin: 4 g/dL (ref 3.5–5.0)
Anion gap: 12 (ref 5–15)
BUN: 68 mg/dL — ABNORMAL HIGH (ref 6–20)
CO2: 22 mmol/L (ref 22–32)
Calcium: 9.1 mg/dL (ref 8.9–10.3)
Chloride: 104 mmol/L (ref 98–111)
Creatinine, Ser: 3.17 mg/dL — ABNORMAL HIGH (ref 0.61–1.24)
GFR, Estimated: 23 mL/min — ABNORMAL LOW
Glucose, Bld: 77 mg/dL (ref 70–99)
Phosphorus: 3.6 mg/dL (ref 2.5–4.6)
Potassium: 4.4 mmol/L (ref 3.5–5.1)
Sodium: 137 mmol/L (ref 135–145)

## 2024-02-15 LAB — POCT HEMOGLOBIN-HEMACUE: Hemoglobin: 11.6 g/dL — ABNORMAL LOW (ref 13.0–17.0)

## 2024-02-15 LAB — FERRITIN: Ferritin: 278 ng/mL (ref 24–336)

## 2024-02-15 LAB — IRON AND TIBC
Iron: 39 ug/dL — ABNORMAL LOW (ref 45–182)
Saturation Ratios: 14 % — ABNORMAL LOW (ref 17.9–39.5)
TIBC: 283 ug/dL (ref 250–450)
UIBC: 244 ug/dL

## 2024-02-15 MED ORDER — DARBEPOETIN ALFA 200 MCG/0.4ML IJ SOSY
200.0000 ug | PREFILLED_SYRINGE | Freq: Once | INTRAMUSCULAR | Status: AC
  Administered 2024-02-15: 200 ug via SUBCUTANEOUS

## 2024-02-15 MED ORDER — DARBEPOETIN ALFA 200 MCG/0.4ML IJ SOSY
PREFILLED_SYRINGE | INTRAMUSCULAR | Status: AC
  Filled 2024-02-15: qty 0.4

## 2024-02-29 ENCOUNTER — Encounter (HOSPITAL_COMMUNITY)

## 2024-05-05 ENCOUNTER — Ambulatory Visit: Admitting: Endocrinology
